# Patient Record
Sex: Male | Born: 1950
Health system: Southern US, Community
[De-identification: ages and names within clinical notes are randomized; demographics above are authoritative.]

## PROBLEM LIST (undated history)

## (undated) DIAGNOSIS — G709 Myoneural disorder, unspecified: Secondary | ICD-10-CM

## (undated) DIAGNOSIS — J329 Chronic sinusitis, unspecified: Secondary | ICD-10-CM

## (undated) DIAGNOSIS — I2510 Atherosclerotic heart disease of native coronary artery without angina pectoris: Secondary | ICD-10-CM

## (undated) DIAGNOSIS — E119 Type 2 diabetes mellitus without complications: Secondary | ICD-10-CM

## (undated) DIAGNOSIS — G510 Bell's palsy: Secondary | ICD-10-CM

## (undated) DIAGNOSIS — G8929 Other chronic pain: Secondary | ICD-10-CM

## (undated) DIAGNOSIS — E039 Hypothyroidism, unspecified: Secondary | ICD-10-CM

## (undated) DIAGNOSIS — R160 Hepatomegaly, not elsewhere classified: Secondary | ICD-10-CM

## (undated) DIAGNOSIS — K279 Peptic ulcer, site unspecified, unspecified as acute or chronic, without hemorrhage or perforation: Secondary | ICD-10-CM

## (undated) DIAGNOSIS — I10 Essential (primary) hypertension: Secondary | ICD-10-CM

## (undated) DIAGNOSIS — K219 Gastro-esophageal reflux disease without esophagitis: Secondary | ICD-10-CM

## (undated) DIAGNOSIS — M25569 Pain in unspecified knee: Secondary | ICD-10-CM

## (undated) DIAGNOSIS — S83289A Other tear of lateral meniscus, current injury, unspecified knee, initial encounter: Secondary | ICD-10-CM

## (undated) DIAGNOSIS — E669 Obesity, unspecified: Secondary | ICD-10-CM

## (undated) DIAGNOSIS — G4733 Obstructive sleep apnea (adult) (pediatric): Secondary | ICD-10-CM

## (undated) DIAGNOSIS — N289 Disorder of kidney and ureter, unspecified: Secondary | ICD-10-CM

## (undated) DIAGNOSIS — E785 Hyperlipidemia, unspecified: Secondary | ICD-10-CM

## (undated) DIAGNOSIS — J45909 Unspecified asthma, uncomplicated: Secondary | ICD-10-CM

## (undated) DIAGNOSIS — N186 End stage renal disease: Secondary | ICD-10-CM

## (undated) DIAGNOSIS — R0600 Dyspnea, unspecified: Secondary | ICD-10-CM

## (undated) DIAGNOSIS — N189 Chronic kidney disease, unspecified: Secondary | ICD-10-CM

## (undated) DIAGNOSIS — Z8719 Personal history of other diseases of the digestive system: Secondary | ICD-10-CM

## (undated) DIAGNOSIS — M549 Dorsalgia, unspecified: Secondary | ICD-10-CM

## (undated) DIAGNOSIS — D649 Anemia, unspecified: Secondary | ICD-10-CM

## (undated) DIAGNOSIS — M1990 Unspecified osteoarthritis, unspecified site: Secondary | ICD-10-CM

## (undated) DIAGNOSIS — E559 Vitamin D deficiency, unspecified: Secondary | ICD-10-CM

## (undated) DIAGNOSIS — J449 Chronic obstructive pulmonary disease, unspecified: Secondary | ICD-10-CM

## (undated) DIAGNOSIS — Z9981 Dependence on supplemental oxygen: Secondary | ICD-10-CM

## (undated) DIAGNOSIS — Z992 Dependence on renal dialysis: Secondary | ICD-10-CM

## (undated) DIAGNOSIS — N184 Chronic kidney disease, stage 4 (severe): Secondary | ICD-10-CM

## (undated) DIAGNOSIS — J189 Pneumonia, unspecified organism: Secondary | ICD-10-CM

## (undated) DIAGNOSIS — I499 Cardiac arrhythmia, unspecified: Secondary | ICD-10-CM

## (undated) DIAGNOSIS — M25519 Pain in unspecified shoulder: Secondary | ICD-10-CM

## (undated) DIAGNOSIS — M109 Gout, unspecified: Secondary | ICD-10-CM

## (undated) DIAGNOSIS — F1910 Other psychoactive substance abuse, uncomplicated: Secondary | ICD-10-CM

## (undated) HISTORY — DX: Essential (primary) hypertension: I10

## (undated) HISTORY — DX: Obesity, unspecified: E66.9

## (undated) HISTORY — DX: Peptic ulcer, site unspecified, unspecified as acute or chronic, without hemorrhage or perforation: K27.9

## (undated) HISTORY — DX: Gastro-esophageal reflux disease without esophagitis: K21.9

## (undated) HISTORY — PX: NM MYOVIEW LTD: HXRAD82

## (undated) HISTORY — DX: Hepatomegaly, not elsewhere classified: R16.0

## (undated) HISTORY — DX: Other tear of lateral meniscus, current injury, unspecified knee, initial encounter: S83.289A

## (undated) HISTORY — DX: Chronic sinusitis, unspecified: J32.9

## (undated) HISTORY — DX: Hyperlipidemia, unspecified: E78.5

## (undated) HISTORY — DX: Vitamin D deficiency, unspecified: E55.9

## (undated) HISTORY — DX: Chronic kidney disease, stage 4 (severe): N18.4

## (undated) HISTORY — PX: TOENAIL EXCISION: SUR558

## (undated) HISTORY — DX: Atherosclerotic heart disease of native coronary artery without angina pectoris: I25.10

## (undated) HISTORY — DX: Chronic obstructive pulmonary disease, unspecified: J44.9

## (undated) HISTORY — DX: Obstructive sleep apnea (adult) (pediatric): G47.33

## (undated) HISTORY — PX: DOPPLER ECHOCARDIOGRAPHY: SHX263

---

## 2003-10-28 ENCOUNTER — Emergency Department (HOSPITAL_COMMUNITY): Admission: EM | Admit: 2003-10-28 | Discharge: 2003-10-29 | Payer: Self-pay

## 2004-02-17 ENCOUNTER — Emergency Department (HOSPITAL_COMMUNITY): Admission: EM | Admit: 2004-02-17 | Discharge: 2004-02-17 | Payer: Self-pay | Admitting: Emergency Medicine

## 2005-02-05 ENCOUNTER — Emergency Department (HOSPITAL_COMMUNITY): Admission: EM | Admit: 2005-02-05 | Discharge: 2005-02-05 | Payer: Self-pay | Admitting: Emergency Medicine

## 2005-07-17 HISTORY — PX: UMBILICAL HERNIA REPAIR: SHX196

## 2005-07-27 ENCOUNTER — Inpatient Hospital Stay (HOSPITAL_COMMUNITY): Admission: EM | Admit: 2005-07-27 | Discharge: 2005-07-29 | Payer: Self-pay | Admitting: Emergency Medicine

## 2005-11-28 ENCOUNTER — Emergency Department (HOSPITAL_COMMUNITY): Admission: EM | Admit: 2005-11-28 | Discharge: 2005-11-28 | Payer: Self-pay | Admitting: Emergency Medicine

## 2007-01-11 ENCOUNTER — Emergency Department (HOSPITAL_COMMUNITY): Admission: EM | Admit: 2007-01-11 | Discharge: 2007-01-12 | Payer: Self-pay | Admitting: Emergency Medicine

## 2007-06-17 ENCOUNTER — Ambulatory Visit (HOSPITAL_COMMUNITY): Admission: RE | Admit: 2007-06-17 | Discharge: 2007-06-17 | Payer: Self-pay | Admitting: Internal Medicine

## 2007-08-25 ENCOUNTER — Emergency Department (HOSPITAL_COMMUNITY): Admission: EM | Admit: 2007-08-25 | Discharge: 2007-08-25 | Payer: Self-pay | Admitting: Emergency Medicine

## 2007-10-07 ENCOUNTER — Encounter: Admission: RE | Admit: 2007-10-07 | Discharge: 2007-10-07 | Payer: Self-pay | Admitting: Occupational Medicine

## 2007-10-14 ENCOUNTER — Emergency Department (HOSPITAL_COMMUNITY): Admission: EM | Admit: 2007-10-14 | Discharge: 2007-10-14 | Payer: Self-pay | Admitting: Emergency Medicine

## 2007-10-16 HISTORY — PX: KNEE ARTHROSCOPY: SUR90

## 2007-10-25 ENCOUNTER — Ambulatory Visit (HOSPITAL_COMMUNITY): Admission: RE | Admit: 2007-10-25 | Discharge: 2007-10-25 | Payer: Self-pay | Admitting: Orthopaedic Surgery

## 2007-11-07 ENCOUNTER — Ambulatory Visit (HOSPITAL_COMMUNITY): Admission: RE | Admit: 2007-11-07 | Discharge: 2007-11-07 | Payer: Self-pay | Admitting: Orthopaedic Surgery

## 2008-06-10 ENCOUNTER — Ambulatory Visit (HOSPITAL_COMMUNITY): Admission: RE | Admit: 2008-06-10 | Discharge: 2008-06-10 | Payer: Self-pay | Admitting: Internal Medicine

## 2008-06-10 ENCOUNTER — Encounter: Payer: Self-pay | Admitting: Orthopedic Surgery

## 2008-06-29 ENCOUNTER — Ambulatory Visit: Payer: Self-pay | Admitting: Orthopedic Surgery

## 2008-06-29 DIAGNOSIS — M7580 Other shoulder lesions, unspecified shoulder: Secondary | ICD-10-CM

## 2008-06-29 DIAGNOSIS — M25819 Other specified joint disorders, unspecified shoulder: Secondary | ICD-10-CM | POA: Insufficient documentation

## 2008-06-29 DIAGNOSIS — M25519 Pain in unspecified shoulder: Secondary | ICD-10-CM | POA: Insufficient documentation

## 2008-07-06 ENCOUNTER — Ambulatory Visit: Payer: Self-pay | Admitting: Orthopedic Surgery

## 2008-07-07 ENCOUNTER — Encounter: Payer: Self-pay | Admitting: Orthopedic Surgery

## 2008-08-06 ENCOUNTER — Ambulatory Visit: Payer: Self-pay | Admitting: Orthopedic Surgery

## 2008-08-06 DIAGNOSIS — M7530 Calcific tendinitis of unspecified shoulder: Secondary | ICD-10-CM | POA: Insufficient documentation

## 2008-08-20 ENCOUNTER — Ambulatory Visit: Payer: Self-pay | Admitting: Orthopedic Surgery

## 2008-09-29 ENCOUNTER — Ambulatory Visit: Payer: Self-pay | Admitting: Gastroenterology

## 2008-09-29 DIAGNOSIS — E782 Mixed hyperlipidemia: Secondary | ICD-10-CM | POA: Insufficient documentation

## 2008-09-29 DIAGNOSIS — E1122 Type 2 diabetes mellitus with diabetic chronic kidney disease: Secondary | ICD-10-CM | POA: Insufficient documentation

## 2008-09-29 DIAGNOSIS — E785 Hyperlipidemia, unspecified: Secondary | ICD-10-CM | POA: Insufficient documentation

## 2008-09-29 DIAGNOSIS — K921 Melena: Secondary | ICD-10-CM | POA: Insufficient documentation

## 2008-09-29 DIAGNOSIS — N185 Chronic kidney disease, stage 5: Secondary | ICD-10-CM

## 2008-09-29 DIAGNOSIS — K219 Gastro-esophageal reflux disease without esophagitis: Secondary | ICD-10-CM | POA: Insufficient documentation

## 2008-09-29 DIAGNOSIS — I10 Essential (primary) hypertension: Secondary | ICD-10-CM | POA: Insufficient documentation

## 2008-10-05 ENCOUNTER — Ambulatory Visit: Payer: Self-pay | Admitting: Orthopedic Surgery

## 2008-10-13 ENCOUNTER — Encounter: Payer: Self-pay | Admitting: Orthopedic Surgery

## 2008-10-14 ENCOUNTER — Encounter: Payer: Self-pay | Admitting: Orthopedic Surgery

## 2008-10-15 ENCOUNTER — Encounter: Payer: Self-pay | Admitting: Gastroenterology

## 2008-10-15 HISTORY — PX: COLONOSCOPY: SHX174

## 2008-10-21 ENCOUNTER — Ambulatory Visit (HOSPITAL_COMMUNITY): Admission: RE | Admit: 2008-10-21 | Discharge: 2008-10-21 | Payer: Self-pay | Admitting: Orthopedic Surgery

## 2008-10-21 ENCOUNTER — Ambulatory Visit: Payer: Self-pay | Admitting: Orthopedic Surgery

## 2008-10-21 DIAGNOSIS — M549 Dorsalgia, unspecified: Secondary | ICD-10-CM | POA: Insufficient documentation

## 2008-10-23 ENCOUNTER — Ambulatory Visit (HOSPITAL_COMMUNITY): Admission: RE | Admit: 2008-10-23 | Discharge: 2008-10-23 | Payer: Self-pay | Admitting: Gastroenterology

## 2008-10-23 ENCOUNTER — Ambulatory Visit: Payer: Self-pay | Admitting: Gastroenterology

## 2008-10-28 ENCOUNTER — Encounter (INDEPENDENT_AMBULATORY_CARE_PROVIDER_SITE_OTHER): Payer: Self-pay | Admitting: *Deleted

## 2008-10-29 ENCOUNTER — Ambulatory Visit (HOSPITAL_COMMUNITY): Admission: RE | Admit: 2008-10-29 | Discharge: 2008-10-29 | Payer: Self-pay | Admitting: Pulmonary Disease

## 2008-10-30 ENCOUNTER — Encounter: Payer: Self-pay | Admitting: Gastroenterology

## 2008-11-02 ENCOUNTER — Encounter: Payer: Self-pay | Admitting: Orthopedic Surgery

## 2008-11-06 ENCOUNTER — Encounter: Payer: Self-pay | Admitting: Orthopedic Surgery

## 2008-11-09 ENCOUNTER — Ambulatory Visit: Payer: Self-pay | Admitting: Orthopedic Surgery

## 2008-12-15 HISTORY — PX: OTHER SURGICAL HISTORY: SHX169

## 2008-12-28 ENCOUNTER — Ambulatory Visit: Payer: Self-pay | Admitting: Orthopedic Surgery

## 2008-12-28 DIAGNOSIS — M75120 Complete rotator cuff tear or rupture of unspecified shoulder, not specified as traumatic: Secondary | ICD-10-CM | POA: Insufficient documentation

## 2008-12-29 ENCOUNTER — Encounter: Payer: Self-pay | Admitting: Orthopedic Surgery

## 2009-01-07 ENCOUNTER — Telehealth: Payer: Self-pay | Admitting: Orthopedic Surgery

## 2009-01-08 ENCOUNTER — Ambulatory Visit (HOSPITAL_COMMUNITY): Admission: RE | Admit: 2009-01-08 | Discharge: 2009-01-08 | Payer: Self-pay | Admitting: Orthopedic Surgery

## 2009-01-08 ENCOUNTER — Ambulatory Visit: Payer: Self-pay | Admitting: Orthopedic Surgery

## 2009-01-11 ENCOUNTER — Ambulatory Visit: Payer: Self-pay | Admitting: Orthopedic Surgery

## 2009-01-13 ENCOUNTER — Encounter: Payer: Self-pay | Admitting: Orthopedic Surgery

## 2009-01-13 ENCOUNTER — Encounter (HOSPITAL_COMMUNITY): Admission: RE | Admit: 2009-01-13 | Discharge: 2009-02-12 | Payer: Self-pay | Admitting: Orthopedic Surgery

## 2009-01-25 ENCOUNTER — Ambulatory Visit: Payer: Self-pay | Admitting: Orthopedic Surgery

## 2009-02-09 ENCOUNTER — Encounter: Payer: Self-pay | Admitting: Orthopedic Surgery

## 2009-02-15 ENCOUNTER — Encounter (HOSPITAL_COMMUNITY): Admission: RE | Admit: 2009-02-15 | Discharge: 2009-03-17 | Payer: Self-pay | Admitting: Orthopedic Surgery

## 2009-02-22 ENCOUNTER — Ambulatory Visit: Payer: Self-pay | Admitting: Orthopedic Surgery

## 2009-02-22 ENCOUNTER — Telehealth: Payer: Self-pay | Admitting: Orthopedic Surgery

## 2009-03-08 ENCOUNTER — Encounter: Payer: Self-pay | Admitting: Orthopedic Surgery

## 2009-03-21 ENCOUNTER — Emergency Department (HOSPITAL_COMMUNITY): Admission: EM | Admit: 2009-03-21 | Discharge: 2009-03-21 | Payer: Self-pay | Admitting: Emergency Medicine

## 2009-03-29 ENCOUNTER — Telehealth: Payer: Self-pay | Admitting: Orthopedic Surgery

## 2009-03-31 ENCOUNTER — Telehealth: Payer: Self-pay | Admitting: Orthopedic Surgery

## 2009-04-05 ENCOUNTER — Ambulatory Visit: Payer: Self-pay | Admitting: Orthopedic Surgery

## 2009-04-05 DIAGNOSIS — M19019 Primary osteoarthritis, unspecified shoulder: Secondary | ICD-10-CM | POA: Insufficient documentation

## 2009-04-08 ENCOUNTER — Telehealth: Payer: Self-pay | Admitting: Orthopedic Surgery

## 2009-04-09 ENCOUNTER — Encounter: Payer: Self-pay | Admitting: Orthopedic Surgery

## 2009-04-15 ENCOUNTER — Ambulatory Visit: Payer: Self-pay | Admitting: Orthopedic Surgery

## 2009-05-12 ENCOUNTER — Ambulatory Visit: Payer: Self-pay | Admitting: Orthopedic Surgery

## 2009-05-24 ENCOUNTER — Encounter: Payer: Self-pay | Admitting: Orthopedic Surgery

## 2009-06-14 ENCOUNTER — Telehealth: Payer: Self-pay | Admitting: Orthopedic Surgery

## 2009-07-27 ENCOUNTER — Emergency Department (HOSPITAL_COMMUNITY): Admission: EM | Admit: 2009-07-27 | Discharge: 2009-07-27 | Payer: Self-pay | Admitting: Emergency Medicine

## 2009-08-19 ENCOUNTER — Ambulatory Visit: Payer: Self-pay | Admitting: Orthopedic Surgery

## 2009-08-19 DIAGNOSIS — M23302 Other meniscus derangements, unspecified lateral meniscus, unspecified knee: Secondary | ICD-10-CM | POA: Insufficient documentation

## 2009-08-24 ENCOUNTER — Telehealth: Payer: Self-pay | Admitting: Orthopedic Surgery

## 2009-08-26 ENCOUNTER — Ambulatory Visit (HOSPITAL_COMMUNITY): Admission: RE | Admit: 2009-08-26 | Discharge: 2009-08-26 | Payer: Self-pay | Admitting: Orthopedic Surgery

## 2009-09-09 ENCOUNTER — Ambulatory Visit: Payer: Self-pay | Admitting: Orthopedic Surgery

## 2009-09-09 DIAGNOSIS — M1710 Unilateral primary osteoarthritis, unspecified knee: Secondary | ICD-10-CM

## 2009-09-09 DIAGNOSIS — IMO0002 Reserved for concepts with insufficient information to code with codable children: Secondary | ICD-10-CM | POA: Insufficient documentation

## 2009-09-30 ENCOUNTER — Telehealth: Payer: Self-pay | Admitting: Orthopedic Surgery

## 2009-10-12 ENCOUNTER — Ambulatory Visit (HOSPITAL_COMMUNITY): Admission: RE | Admit: 2009-10-12 | Discharge: 2009-10-12 | Payer: Self-pay | Admitting: Urology

## 2009-10-25 ENCOUNTER — Telehealth: Payer: Self-pay | Admitting: Orthopedic Surgery

## 2009-10-28 ENCOUNTER — Encounter: Admission: RE | Admit: 2009-10-28 | Discharge: 2009-10-28 | Payer: Self-pay | Admitting: Internal Medicine

## 2009-11-11 ENCOUNTER — Ambulatory Visit: Payer: Self-pay | Admitting: Orthopedic Surgery

## 2009-12-09 ENCOUNTER — Ambulatory Visit: Payer: Self-pay | Admitting: Orthopedic Surgery

## 2010-01-18 ENCOUNTER — Ambulatory Visit (HOSPITAL_COMMUNITY): Admission: RE | Admit: 2010-01-18 | Discharge: 2010-01-18 | Payer: Self-pay | Admitting: Pulmonary Disease

## 2010-01-19 ENCOUNTER — Telehealth: Payer: Self-pay | Admitting: Orthopedic Surgery

## 2010-02-21 ENCOUNTER — Telehealth: Payer: Self-pay | Admitting: Orthopedic Surgery

## 2010-03-10 ENCOUNTER — Ambulatory Visit: Payer: Self-pay | Admitting: Orthopedic Surgery

## 2010-03-28 ENCOUNTER — Emergency Department (HOSPITAL_COMMUNITY): Admission: EM | Admit: 2010-03-28 | Discharge: 2010-03-28 | Payer: Self-pay | Admitting: Emergency Medicine

## 2010-03-31 ENCOUNTER — Inpatient Hospital Stay (HOSPITAL_COMMUNITY): Admission: EM | Admit: 2010-03-31 | Discharge: 2010-04-04 | Payer: Self-pay | Admitting: Emergency Medicine

## 2010-04-12 ENCOUNTER — Ambulatory Visit (HOSPITAL_COMMUNITY): Admission: RE | Admit: 2010-04-12 | Discharge: 2010-04-12 | Payer: Self-pay | Admitting: Internal Medicine

## 2010-04-18 ENCOUNTER — Telehealth: Payer: Self-pay | Admitting: Orthopedic Surgery

## 2010-06-14 ENCOUNTER — Ambulatory Visit: Payer: Self-pay | Admitting: Orthopedic Surgery

## 2010-06-24 ENCOUNTER — Telehealth: Payer: Self-pay | Admitting: Orthopedic Surgery

## 2010-08-10 ENCOUNTER — Emergency Department (HOSPITAL_COMMUNITY)
Admission: EM | Admit: 2010-08-10 | Discharge: 2010-08-10 | Payer: Self-pay | Source: Home / Self Care | Admitting: Emergency Medicine

## 2010-08-18 NOTE — Assessment & Plan Note (Signed)
Summary: recheck left shoulder post op.cbt   Visit Type:  Follow-up Primary Provider:  [name]  CC:  left shoulder pain.  History of Present Illness: I saw [name] [name] in the office today for a followup visit.  He is a 60 years old man with the complaint of:  left shoulder.   DX: left shoulder OA, partial RC tear.  Treatment :ASAD left shoulder, DOS 01/08/09, injection.  MEDS: Percocet 5.  subjective the Percocet is still helping his LEFT shoulder pain he has some stiffness in the morning  He is more concerned today about his LEFT knee had a LEFT knee arthroscopy by Dr. Luna Glasgow several years ago has an MRI from May or April 2000 I'm sure small tear of the posterior horn of medial meniscus  She also had has had some continued discomfort in that LEFT knee but 3 weeks ago he was stepping down out of a truck and he hyperextended it started having pain and swelling and now the knee feels like it wants to give away the Percocet helps.  Still some pain, some stiffness early am. Has a pulling sensation anterior shoulder.  T      Allergies: No Known Drug Allergies  Past History:  Past Medical History: Last updated: 09/29/2008 COPD Sinusitis Remote PUD, reports f/u EGD about 8 years ago unremarkable Diabetes GERD Hyperlipidemia Hypertension  Family History: Last updated: 09/29/2008 Family History of Diabetes Hx, family, asthma Father: DM, HTN, deceased age 52 Mother: MI, HTN, DM, deceased age 2, ?cancer before died (breast?) Siblings: 4 sisters, HTN, DM No FH of Colon Cancer:  Social History: Last updated: 09/29/2008 Patient is married.  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 Quit 3 different time, last time 11/09 Alcohol Use - yes, occasionally Illicit Drug Use - yes, smokes crack cocaine twice a month.  H/O previous  intranasal and IV drug use over 6 years ago.  Risk Factors: Caffeine Use: 3 (06/29/2008)  Risk Factors: Smoking Status: quit < 6 months (09/29/2008)  Past Surgical History: left knee arthroscopy hernia, umbilical  Review of Systems Neuro:  Denies numbness. MS:  Complains of joint swelling.  Physical Exam  Additional Exam:  general: Normal appearance  Oriented x3  Mood and affect normal  Gait and station abnormal with a limp LEFT leg favored  LEFT lower extremity large joint effusion  Range of motion 90  Knee stable  Strength normal  Skin normal  Medial joint line tenderness with positive Murray's LEFT knee  Pulse normal  Lymph nodes negative  Sensation normal  Balance normal coordination normal     Impression & Recommendations:  Problem # 1:  DERANGEMENT MENISCUS (ICD-717.5) Assessment New  Orders: Est. Patient Level IV [id number])  MRI LEFT knee fluid should provide some contrast to look for new meniscal tear  Patient Instructions: 1)  MRI left knee  2)  (come back for results) 3)  apply ice , take percocet Prescriptions: PERCOCET 5-325 MG TABS (OXYCODONE-ACETAMINOPHEN) 1 by mouth q 6 as needed pain  #90 x 0   Entered and Authorized by:   Arther Abbott MD   Signed by:   Arther Abbott MD on 08/19/2009   Method used:   Print then Give to Patient   RxID:   [id number]

## 2010-08-18 NOTE — Progress Notes (Signed)
Summary: want Percocet prescription   Phone Note Call from Patient   Summary of Call: [name] [name] ([birth year]) wants new Percocet prescription His # [phone] Initial call taken by: [name],  February 21, 2010 10:04 AM  Follow-up for Phone Call        printed signed and its ready Follow-up by: Arther Abbott MD,  February 21, 2010 10:06 AM  Additional Follow-up for Phone Call Additional follow up Details #1::        Adbvised the patient to pick up prescription Additional Follow-up by: [name],  February 21, 2010 10:48 AM    Prescriptions: PERCOCET 5-325 MG TABS (OXYCODONE-ACETAMINOPHEN) take 1/2 tablet every 4 hrs as needed pain  #90 x 0   Entered and Authorized by:   Arther Abbott MD   Signed by:   Arther Abbott MD on 02/21/2010   Method used:   Print then Give to Patient   RxID:   [id number]

## 2010-08-18 NOTE — Progress Notes (Signed)
Summary: wants Percocet prescription  Phone Note Call from Patient   Summary of Call: [name] [name] (2050-09-02) wants new Percocet prescription. His # [phone] or [phone] Initial call taken by: [name],  September 30, 2009 9:04 AM    Prescriptions: PERCOCET 5-325 MG TABS (OXYCODONE-ACETAMINOPHEN) 1 by mouth q 6 as needed pain  #90 x 0   Entered and Authorized by:   Arther Abbott MD   Signed by:   Arther Abbott MD on 09/30/2009   Method used:   Print then Give to Patient   RxID:   [id number]

## 2010-08-18 NOTE — Assessment & Plan Note (Signed)
Summary: INJECTION/ARTH, OR KNEE REPLACE/MEDICARE/BSF   Visit Type:  Follow-up Primary Provider:  [name]  CC:  left knee recheck.  History of Present Illness: I saw [name] [name] in the office today for a followup visit.  He is a 60 years old man with the complaint of:  2 month recheck left knee after injection, OA left knee  He says he has been taking Percocet 2.5 helps, needs refill  C/O SWELLING AND PAIN   Today is one month recheck, discuss options, pt is not ready for surgery.  Has pain level of 6 today with left knee, has some fluid.  Injections help knee, 09/09/09 was last injection.  Also brought pictures from Dr. Luna Glasgow of his LEFT knee arthroscopy in 2009 which showed he did have meniscal tear he had an adequate meniscal resection              Allergies: No Known Drug Allergies  Review of Systems Musculoskeletal:  shoulder is stable at this point.   Knee Exam  General:    Well-developed, well-nourished, normal body habitus; no deformities, normal grooming.  Gait:    Normal heel-toe gait pattern bilaterally.    Skin:    Intact, no scars, lesions, rashes, cafe au lait spots, or bruising.    Inspection:    small joint effusion  Palpation:    medial joint line tenderness  Vascular:    There was no swelling or varicose veins. The pulses and temperature are normal. There was no edema or tenderness.  Sensory:    Gross coordination and sensation were normal.    Motor:    Motor strength 5/5 bilaterally for quadriceps, hamstrings, ankle dorsiflexion, and ankle plantar flexion.    Knee Exam:    Left:    Inspection:  Abnormal    Palpation:  Abnormal    Range of Motion:       Flexion-Active: 125 degrees       Extension-Active: full  Anterior drawer:    Left negative Posterior drawer:    Left negative MCL:    Left negative LCL:    Left negative   Impression & Recommendations:  Problem # 1:  KNEE, ARTHRITIS, DEGEN./OSTEO  [id number]) Assessment Deteriorated  injection only LEFT knee  Verbal consent was obtained. The knee was prepped with alcohol and ethyl chloride. 1 cc of depomedrol 40mg /cc and 4 cc of lidocaine 1% was injected. there were no complications.  His updated medication list for this problem includes:     Percocet 5-325 Mg Tabs (Oxycodone-acetaminophen) .Marland Kitchen... Take 1/2 tablet every 4 hrs as needed pain  Orders: Est. Patient Level III [id number]) Depo- Medrol 40mg  (J1030) Joint Aspirate / Injection, Large (20610)  Patient Instructions: 1)  You have received an injection of cortisone today. You may experience increased pain at the injection site. Apply ice pack to the area for 20 minutes every 2 hours and take 2 xtra strength tylenol every 8 hours. This increased pain will usually resolve in 24 hours. The injection will take effect in 3-10 days.  2)  refill Percocet  3)  Please schedule a follow-up appointment in 3 months. Prescriptions: PERCOCET 5-325 MG TABS (OXYCODONE-ACETAMINOPHEN) take 1/2 tablet every 4 hrs as needed pain  #90 x 0   Entered and Authorized by:   Arther Abbott MD   Signed by:   Arther Abbott MD on 12/09/2009   Method used:   Print then Give to Patient   RxID:   [id number]

## 2010-08-18 NOTE — Progress Notes (Signed)
Summary: Patient requests Rx for Percocet  Phone Note Call from Patient   Caller: Patient Summary of Call: Patient called to ask for new prescription for Percocet.  Ph # is [phone] Initial call taken by: Ihor Austin,  October 25, 2009 1:20 PM  Follow-up for Phone Call        ok Follow-up by: Arther Abbott MD,  October 25, 2009 1:34 PM  Additional Follow-up for Phone Call Additional follow up Details #1::        advised patient to come in and get rx Additional Follow-up by: Peter Minium,  October 25, 2009 1:49 PM    New/Updated Medications: PERCOCET 2.5-325 MG TABS (OXYCODONE-ACETAMINOPHEN) 1 q  4 as needed pain Prescriptions: PERCOCET 2.5-325 MG TABS (OXYCODONE-ACETAMINOPHEN) 1 q  4 as needed pain  #90 x 0   Entered and Authorized by:   Arther Abbott MD   Signed by:   Arther Abbott MD on 10/25/2009   Method used:   Print then Give to Patient   RxID:   [id number]

## 2010-08-18 NOTE — Assessment & Plan Note (Signed)
Summary: 2 M RE-CK LT KNEE/LT SHOULDER//MEDICARE/CAF   Visit Type:  Follow-up Primary Provider:  [name]  CC:  recheck left knee.  History of Present Illness: I saw [name] [name] in the office today for a followup visit.  He is a 60 years old man with the complaint of:  2 month recheck left knee after injection, OA left knee  also status post LEFT shoulder surgery.   He says he has been taking Percocet 2.5 helps.  C/O SWELLING AND PAIN   He does have some swelling and effusion in that LEFT knee. His range of motion remains good. His knee is stable. His pain, tenderness or medial neurovascular exam is intact. Exam plating without a LEFT.  LEFT shoulder treatment at this time. He says a little pain when he lifts over his head, but not much.  I gave him options for surgical treatment or another injection. Once it think about it.  Continue his Percocet half tablet q.4 as needed for pain, and he'll see me in a month, and we'll discuss further treatment regarding his LEFT knee           Allergies: No Known Drug Allergies   Other Orders: Est. Patient Level II [id number])  Patient Instructions: 1)  OPTIONS: 2)  INJECTION, ARTHROSCOPY OR KNEE REPLACEMENT  3)  RETURN IN A MONTH  Prescriptions: PERCOCET 5-325 MG TABS (OXYCODONE-ACETAMINOPHEN) take 1/2 tablet every 4 hrs as needed pain  #90 x 0   Entered and Authorized by:   Arther Abbott MD   Signed by:   Arther Abbott MD on 11/11/2009   Method used:   Print then Give to Patient   RxID:   [id number]

## 2010-08-18 NOTE — Progress Notes (Signed)
Summary: wants percocet  prescription  Phone Note Call from Patient   Summary of Call: [name] [name] (May 03, 2051) wants a prescription for Percocet His # is [id number] Initial call taken by: [name],  January 19, 2010 5:06 PM  Follow-up for Phone Call        Left a message for the patient to call our office Follow-up by: [name],  January 20, 2010 8:53 AM  Additional Follow-up for Phone Call Additional follow up Details #1::        Pqtient  picked up the prescription Additional Follow-up by: [name],  January 20, 2010 11:40 AM    Prescriptions: PERCOCET 5-325 MG TABS (OXYCODONE-ACETAMINOPHEN) take 1/2 tablet every 4 hrs as needed pain  #90 x 0   Entered and Authorized by:   Arther Abbott MD   Signed by:   Arther Abbott MD on 01/19/2010   Method used:   Print then Give to Patient   RxID:   [id number]

## 2010-08-18 NOTE — Assessment & Plan Note (Signed)
Summary: MRI RESULTS BRINGING DISC/MEDICARE/BSF   Visit Type:  Follow-up Primary Provider:  [name]  CC:  left knee pain.  History of Present Illness: I saw [name] [name] in the office today for a followup visit.  He is a 60 years old man with the complaint of:  left knee  MRI results  IMPRESSION:   1.  Post meniscectomy changes medially with progressive medial compartment degenerative chondrosis, medial meniscal degeneration and probable reactive edema peripherally in the medial femoral condyle. 2.  No significant lateral or patellofemoral compartment findings. 3.  Enlarged joint effusion with mild synovial irregularity suggesting synovitis. 4.  No acute ligamentous findings.   Read By:  [name] [name],  M.D.      Imaging studies were reviewed with the report and I agree with the report he basically has arthritis in his knee  Recommend aspiration injection  Review of systems LEFT shoulder stable on Percocet  LEFT knee exam there is a mild joint effusion.  His flexion arc is 120.  There is medial joint line tenderness.  Muscle strength and muscle tone are normal.  His knee is stable.  He has varus alignment.  Aspirated 25 cc of fluid from LEFT knee under sterile technique it was clear fluid it was followed by injection of steroids into the knee joint we injected 40 mg of Depo-Medrol and lidocaine 5 cc 1%  Allergies: No Known Drug Allergies  Physical Exam  Additional Exam:  LEFT knee  [name] [name] looks fine today.  He is modestly overweight.  He is going to x3  His mood and affect are normal.  Has a slight limp to his LEFT knee  Has a mild effusion.  It inhibits his joint range of motion.  His knee is stable strength is normal he has medial joint line tenderness.  There are no skin changes pulses are intact     Impression & Recommendations:  Problem # 1:  KNEE, ARTHRITIS, DEGEN./OSTEO (ICD-715.96) Assessment Deteriorated  aspirate inject LEFT  knee  His updated medication list for this problem includes:    Norco 5-325 Mg Tabs (Hydrocodone-acetaminophen) .Marland Kitchen... 1 by mouth q 4 as needed    Norco 7.5-325 Mg Tabs (Hydrocodone-acetaminophen) .Marland Kitchen... 1 q 4 as needed pain    Lorcet 10/650 10-650 Mg Tabs (Hydrocodone-acetaminophen) .Marland Kitchen... 1 q  6 as needed pain    Percocet 5-325 Mg Tabs (Oxycodone-acetaminophen) .Marland Kitchen... 1 by mouth q 6 as needed pain    Lorcet 10/650 10-650 Mg Tabs (Hydrocodone-acetaminophen) ..... One by mouth q 4 hrs as needed pain  Orders: Est. Patient Level III [id number]) Joint Aspirate / Injection, Large (20610) Depo- Medrol 40mg  (J1030)  Patient Instructions: 1)  You have received an injection of cortisone today. You may experience increased pain at the injection site. Apply ice pack to the area for 20 minutes every 2 hours and take 2 xtra strength tylenol every 8 hours. This increased pain will usually resolve in 24 hours. The injection will take effect in 3-10 days.  2)  Please schedule a follow-up appointment in 2 months. Prescriptions: PERCOCET 5-325 MG TABS (OXYCODONE-ACETAMINOPHEN) 1 by mouth q 6 as needed pain  #90 x 0   Entered and Authorized by:   Arther Abbott MD   Signed by:   Arther Abbott MD on 09/09/2009   Method used:   Print then Give to Patient   RxID:   [id number]

## 2010-08-18 NOTE — Progress Notes (Signed)
Summary: Referral to Dr. Ace Gins for pain management.  Phone Note Outgoing Call   Call placed by: Santo Held,  June 24, 2010 8:37 AM Call placed to: Specialist Action Taken: Information Sent Summary of Call: I faxed a referral for this patient to Dr. Ace Gins to be seen for pain management.

## 2010-08-18 NOTE — Progress Notes (Signed)
Summary: needs new Percocet prescription  Phone Note Call from Patient   Summary of Call: [name] [name] (2051-02-27) left a message  that he needs  new Percocet prescription. His # [phone] Initial call taken by: [name],  April 18, 2010 10:34 AM    Prescriptions: PERCOCET 5-325 MG TABS (OXYCODONE-ACETAMINOPHEN) take 1/2 tablet every 4 hrs as needed pain  #90 x 0   Entered and Authorized by:   Arther Abbott MD   Signed by:   Arther Abbott MD on 04/18/2010   Method used:   Print then Give to Patient   RxID:   [id number]

## 2010-08-18 NOTE — Assessment & Plan Note (Signed)
Summary: 3 M RE-CK KNEE/RESP TO MED+RE-CK LT SHOULDERMEDICARE/CAF   Visit Type:  Follow-up Primary [name]:  [name]  CC:  recheck knee.  History of Present Illness: I saw [name] [name] in the office today for a followup visit.  He is a 60 years old man with the complaint of:  3 month recheck left knee OA and left shoulder  Problem #1 status post arthroscopy LEFT shoulder with followup MRI showing a complete tear the rotator cuff Problem #2 osteoarthritis LEFT knee status post LEFT knee arthroscopy with continued pain and recurrent effusions Problem #3 chronic pain requiring Percocet 5 mg for pain relief   His pain is intermittent in the shoulder and the knee but he does want to have surgery right now  I have discussed with him the use of Percocet as a pain reliever on a chronic basis and advised him that if he is not going to have surgery to try to get relief of his pain then he should go to pain clinic for monitoring and dispensing of this type of medicine    Allergies (verified): No Known Drug Allergies   Impression & Recommendations:  Problem # 1:  KNEE, ARTHRITIS, DEGEN./OSTEO (ICD-715.96)  His updated medication list for this problem includes:    Norco 5-325 Mg Tabs (Hydrocodone-acetaminophen) .Marland Kitchen... 1 by mouth q 4 as needed    Norco 7.5-325 Mg Tabs (Hydrocodone-acetaminophen) .Marland Kitchen... 1 q 4 as needed pain    Lorcet 10/650 10-650 Mg Tabs (Hydrocodone-acetaminophen) .Marland Kitchen... 1 q  6 as needed pain    Percocet 5-325 Mg Tabs (Oxycodone-acetaminophen) .Marland Kitchen... 1 by mouth q 4 as needed pain    Lorcet 10/650 10-650 Mg Tabs (Hydrocodone-acetaminophen) ..... One by mouth q 4 hrs as needed pain    Percocet 2.5-325 Mg Tabs (Oxycodone-acetaminophen) .Marland Kitchen... 1 q  4 as needed pain    Percocet 5-325 Mg Tabs (Oxycodone-acetaminophen) .Marland Kitchen... Take 1/2 tablet every 4 hrs as needed pain  Orders: Pain Clinic Referral (Pain) Est. Patient Level II [id number])  Problem # 2:  RUPTURE ROTATOR CUFF  (ICD-727.61)  Orders: Est. Patient Level II [id number])  Patient Instructions: 1)  referring to the pain clinic  2)  f/u in 3 months  Prescriptions: PERCOCET 5-325 MG TABS (OXYCODONE-ACETAMINOPHEN) 1 by mouth q 4 as needed pain  #90 x 0   Entered and Authorized by:   Arther Abbott MD   Signed by:   Arther Abbott MD on 06/14/2010   Method used:   Print then Give to Patient   RxID:   [id number]    Orders Added: 1)  Pain Clinic Referral [Pain] 2)  Est. Patient Level II [id number]

## 2010-08-18 NOTE — Assessment & Plan Note (Signed)
Summary: 3 M RE-CK KNEE/LT SHOULDERMEDICARE/CAF   Visit Type:  Follow-up Primary Abbrielle Batts:  [name]  CC:  recheck knee.  History of Present Illness: I saw LAWYER BUSTER in the office today for a followup visit.  He is a 60 years old man with the complaint of:  2 month recheck left knee after injection, OA left knee     Today is 3 month recheck left knee after injection 12/09/09.  The injection helped, has been working on house, left knee pain is ok, has left shoulder pain.  DX: left shoulder OA, partial RC tear.  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  Physical Exam  Additional Exam:  GEN: well developed, well nourished, normal grooming and hygiene, no deformity and normal body habitus.    Psyche: awake, alert and oriented. Mood normal   Gait: normal   The shoulder is tender over the anterolateral acromion, there is no swelling, the shoulder is stable, the SubScap is 5/5, the EXT/ROT are 5/5, the SSpinatus is 4/5. The impingement sign is positive. ROM: EXT/ROT=  50            INT/ROT=  N/T           FLEXION=  150         ABDUCTION=90         Impression & Recommendations:  Problem # 1:  KNEE, ARTHRITIS, DEGEN./OSTEO (ICD-715.96) Assessment Improved  His updated medication list for this problem includes:    Norco 5-325 Mg Tabs (Hydrocodone-acetaminophen) .Marland Kitchen... 1 by mouth q 4 as needed    Norco 7.5-325 Mg Tabs (Hydrocodone-acetaminophen) .Marland Kitchen... 1 q 4 as needed pain    Lorcet 10/650 10-650 Mg Tabs (Hydrocodone-acetaminophen) .Marland Kitchen... 1 q  6 as needed pain    Percocet 5-325 Mg Tabs  (Oxycodone-acetaminophen) .Marland Kitchen... 1 by mouth q 4 as needed pain    Lorcet 10/650 10-650 Mg Tabs (Hydrocodone-acetaminophen) ..... One by mouth q 4 hrs as needed pain    Percocet 2.5-325 Mg Tabs (Oxycodone-acetaminophen) .Marland Kitchen... 1 q  4 as needed pain    Percocet 5-325 Mg Tabs (Oxycodone-acetaminophen) .Marland Kitchen... Take 1/2 tablet every 4 hrs as needed pain  Orders: Est. Patient Level III [id number])  Problem # 2:  IMPINGEMENT SYNDROME (ICD-726.2) Assessment: Deteriorated  REC: ORAL PERCOCET, ACTIVITY MODIFICATION   Orders: Est. Patient Level III [id number])  Medications Added to Medication List This Visit: 1)  Percocet 5-325 Mg Tabs (Oxycodone-acetaminophen) .Marland Kitchen.. 1 by mouth q 4 as needed pain  Patient Instructions: 1)  Please schedule a follow-up appointment in 3 months. Prescriptions: PERCOCET 5-325 MG TABS (OXYCODONE-ACETAMINOPHEN) 1 by mouth q 4 as needed pain  #90 x 0   Entered and Authorized by:   Arther Abbott MD   Signed by:   Arther Abbott MD on 03/10/2010   Method used:   Print then Give to Patient   RxID:   [id number]

## 2010-08-18 NOTE — Progress Notes (Signed)
Summary: MRI appointment.  Phone Note Outgoing Call   Call placed by: Santo Held,  August 24, 2009 2:37 PM Call placed to: Patient Action Taken: Phone Call Completed, Appt scheduled Summary of Call: I called to give the patient his MRI appointment at [facility] on 08-26-09 at 10:30. Patient has Medicare, no precert is needed. Patient will follow up back here at our office.

## 2010-08-24 ENCOUNTER — Telehealth: Payer: Self-pay | Admitting: Orthopedic Surgery

## 2010-08-24 ENCOUNTER — Encounter: Payer: Self-pay | Admitting: Orthopedic Surgery

## 2010-09-01 NOTE — Letter (Signed)
Summary: Generic Letter re: pain medication  [facility]  [address]. [place] [address]  [place], [place] [address]   Phone: [phone]  Fax: [phone]       08/24/2010  [name] [name] [place], [place]  [address]   Dear [name] [name],  This letter is to address a matter that has come to our attention following the referral to pain management specialist.  Due to your decision to decline pain management, effective immediately, we can no longer offer or prescribe pain medication.         Sincerely,    Demetrius Revel, MD

## 2010-09-01 NOTE — Progress Notes (Signed)
Summary: Patient declined appointment with Dr. Ace Gins.  Phone Note From Other Clinic   Caller: Referral Coordinator Summary of Call: Latoya from Dr. Unice Bailey office sent a fax stating that the patient declined an appointment because he wanted to continue a pain medication management that conflicted with their non opioid protocol. Initial call taken by: Santo Held,  August 24, 2010 9:22 AM  Follow-up for Phone Call        please send a letter that we can no longer offer him pain medication if he refuses pain management  Follow-up by: Arther Abbott MD,  August 24, 2010 9:24 AM  Additional Follow-up for Phone Call Additional follow up Details #1::        letter in chart for review and sign. Additional Follow-up by: Ihor Austin,  August 24, 2010 3:58 PM

## 2010-09-14 ENCOUNTER — Ambulatory Visit (INDEPENDENT_AMBULATORY_CARE_PROVIDER_SITE_OTHER): Payer: Medicare PPO | Admitting: Orthopedic Surgery

## 2010-09-14 ENCOUNTER — Encounter: Payer: Self-pay | Admitting: Orthopedic Surgery

## 2010-09-14 DIAGNOSIS — IMO0002 Reserved for concepts with insufficient information to code with codable children: Secondary | ICD-10-CM

## 2010-09-14 DIAGNOSIS — M1710 Unilateral primary osteoarthritis, unspecified knee: Secondary | ICD-10-CM

## 2010-09-22 NOTE — Assessment & Plan Note (Signed)
Summary: 3 M RE-CK LT KNEE+SHOULDER/MEDICARE/CAF   Visit Type:  Follow-up Primary Provider:  [name]  CC:  recheck knee and shoulder.  History of Present Illness: I saw [name] [name] in the office today for a followup visit.  He is a 60 years old man with the complaint of:  3 month recheck left knee OA and left shoulder  Problem #1 status post arthroscopy LEFT shoulder with followup MRI showing a complete tear the rotator cuff Problem #2 osteoarthritis LEFT knee status post LEFT knee arthroscopy with continued pain and recurrent effusions Problem #3 chronic pain requiring Percocet 5 mg for pain relief  I have discussed with him the use of Percocet as a pain reliever on a chronic basis and advised him that if he is not going to have surgery to try to get relief of his pain then he should go to pain clinic for monitoring and dispensing of this type of medicine  Patient refused appt with Dr. Ace Gins because he wanted to continue pain medication, not their protocol.  Pain level is around 5 due to weather.  Out of Percocet.  His LEFT knee still bothers a max of the time seems to have pain around his patella. On exam, tenderness of medial femoral condyle and medial joint line      Allergies: No Known Drug Allergies  Review of Systems Musculoskeletal:  Complains of joint pain and stiffness.  Physical Exam  Additional Exam:  GENERAL: Appearance is normal   CDV: normal pulse and temperature   exam LEFT knee. He is tender over the medial lateral facets of the patella inferior pole patella. Medial joint line.  He still has a functional range of motion. His knee shows no laxity. He has normal muscle tone and has an unsupported ambulation pattern.     Impression & Recommendations:  Problem # 1:  KNEE, ARTHRITIS, DEGEN./OSTEO (ICD-715.96) Assessment Deteriorated  His updated medication list for this problem includes:    Norco 5-325 Mg Tabs (Hydrocodone-acetaminophen)  .Marland Kitchen... 1 by mouth q 4 as needed    Norco 7.5-325 Mg Tabs (Hydrocodone-acetaminophen) .Marland Kitchen... 1 q 4 as needed pain    Lorcet 10/650 10-650 Mg Tabs (Hydrocodone-acetaminophen) .Marland Kitchen... 1 q  6 as needed pain    Percocet 5-325 Mg Tabs (Oxycodone-acetaminophen) .Marland Kitchen... 1 by mouth q 4 as needed pain    Lorcet 10/650 10-650 Mg Tabs (Hydrocodone-acetaminophen) ..... One by mouth q 4 hrs as needed pain    Percocet 2.5-325 Mg Tabs (Oxycodone-acetaminophen) .Marland Kitchen... 1 q  4 as needed pain    Percocet 5-325 Mg Tabs (Oxycodone-acetaminophen) .Marland Kitchen... Take 1/2 tablet every 4 hrs as needed pain    Norco 5-325 Mg Tabs (Hydrocodone-acetaminophen) .Marland Kitchen... 1 by mouth q 4 prn pain  Orders: Est. Patient Level III [id number]) Joint Aspirate / Injection, Large (20610) Depo- Medrol 40mg  (J1030)  Medications Added to Medication List This Visit: 1)  Norco 5-325 Mg Tabs (Hydrocodone-acetaminophen) .Marland Kitchen.. 1 by mouth q 4 prn pain  Patient Instructions: 1)  You have received an injection of cortisone today. You may experience increased pain at the injection site. Apply ice pack to the area for 20 minutes every 2 hours and take 2 xtra strength tylenol every 8 hours. This increased pain will usually resolve in 24 hours. The injection will take effect in 3-10 days.  2)  Please schedule a follow-up appointment in 6 months. 3)  left knee xrays  Prescriptions: NORCO 5-325 MG TABS (HYDROCODONE-ACETAMINOPHEN) 1 by mouth q 4 prn pain  #  40 x 5   Entered and Authorized by:   Arther Abbott MD   Signed by:   Arther Abbott MD on 09/14/2010   Method used:   Print then Give to Patient   RxID:   [id number]    Orders Added: 1)  Est. Patient Level III [id number] 2)  Joint Aspirate / Injection, Large [id number] 3)  Depo- Medrol 40mg  [id number]

## 2010-09-29 ENCOUNTER — Ambulatory Visit: Payer: Self-pay

## 2010-09-29 LAB — DIFFERENTIAL
Eosinophils Absolute: 0.1 10*3/uL (ref 0.0–0.7)
Lymphocytes Relative: 23 % (ref 12–46)
Lymphs Abs: 1.4 10*3/uL (ref 0.7–4.0)
Lymphs Abs: 1.6 10*3/uL (ref 0.7–4.0)
Monocytes Relative: 9 % (ref 3–12)
Neutro Abs: 1.8 10*3/uL (ref 1.7–7.7)
Neutrophils Relative %: 49 % (ref 43–77)
Neutrophils Relative %: 66 % (ref 43–77)

## 2010-09-29 LAB — CBC
HCT: 40.3 % (ref 39.0–52.0)
Hemoglobin: 13.2 g/dL (ref 13.0–17.0)
MCH: 28 pg (ref 26.0–34.0)
Platelets: 148 10*3/uL — ABNORMAL LOW (ref 150–400)
RBC: 4.73 MIL/uL (ref 4.22–5.81)
RBC: 4.94 MIL/uL (ref 4.22–5.81)
WBC: 6.8 10*3/uL (ref 4.0–10.5)

## 2010-09-29 LAB — GLUCOSE, CAPILLARY
Glucose-Capillary: 108 mg/dL — ABNORMAL HIGH (ref 70–99)
Glucose-Capillary: 109 mg/dL — ABNORMAL HIGH (ref 70–99)
Glucose-Capillary: 118 mg/dL — ABNORMAL HIGH (ref 70–99)
Glucose-Capillary: 119 mg/dL — ABNORMAL HIGH (ref 70–99)
Glucose-Capillary: 126 mg/dL — ABNORMAL HIGH (ref 70–99)
Glucose-Capillary: 152 mg/dL — ABNORMAL HIGH (ref 70–99)
Glucose-Capillary: 172 mg/dL — ABNORMAL HIGH (ref 70–99)
Glucose-Capillary: 91 mg/dL (ref 70–99)

## 2010-09-29 LAB — BASIC METABOLIC PANEL
CO2: 30 mEq/L (ref 19–32)
Calcium: 9.3 mg/dL (ref 8.4–10.5)
Calcium: 9.1 mg/dL (ref 8.4–10.5)
Chloride: 101 mEq/L (ref 96–112)
Creatinine, Ser: 1.36 mg/dL (ref 0.4–1.5)
GFR calc Af Amer: >60 mL/min (ref >60)
GFR calc Af Amer: >60 mL/min (ref >60)
Potassium: 4 mEq/L (ref 3.5–5.1)
Sodium: 139 mEq/L (ref 135–145)

## 2010-09-29 LAB — CULTURE, BLOOD (ROUTINE X 2)
Culture: NO GROWTH
Culture: NO GROWTH

## 2010-10-21 LAB — GLUCOSE, CAPILLARY: Glucose-Capillary: 124 mg/dL — ABNORMAL HIGH (ref 70–99)

## 2010-10-24 LAB — CBC
HCT: 39.9 % (ref 39.0–52.0)
MCV: 84.1 fL (ref 78.0–100.0)
RBC: 4.74 MIL/uL (ref 4.22–5.81)
WBC: 4.7 10*3/uL (ref 4.0–10.5)

## 2010-10-24 LAB — RAPID URINE DRUG SCREEN, HOSP PERFORMED
Amphetamines: NOT DETECTED
Barbiturates: NOT DETECTED
Benzodiazepines: NOT DETECTED
Cocaine: NOT DETECTED

## 2010-10-24 LAB — BASIC METABOLIC PANEL
Chloride: 104 mEq/L (ref 96–112)
Creatinine, Ser: 1.42 mg/dL (ref 0.4–1.5)
GFR calc Af Amer: >60 mL/min (ref >60)
Potassium: 4 mEq/L (ref 3.5–5.1)

## 2010-10-24 LAB — GLUCOSE, CAPILLARY: Glucose-Capillary: 188 mg/dL — ABNORMAL HIGH (ref 70–99)

## 2010-10-26 LAB — BLOOD GAS, ARTERIAL
Bicarbonate: 25.9 mEq/L — ABNORMAL HIGH (ref 20.0–24.0)
TCO2: 22.8 mmol/L (ref 0–100)
pCO2 arterial: 43.3 mmHg (ref 35.0–45.0)
pH, Arterial: 7.393 (ref 7.350–7.450)

## 2010-11-27 ENCOUNTER — Emergency Department (HOSPITAL_COMMUNITY)
Admission: EM | Admit: 2010-11-27 | Discharge: 2010-11-28 | Disposition: A | Discharge status: Home / Self Care | Payer: Medicare PPO | Attending: Emergency Medicine | Admitting: Emergency Medicine

## 2010-11-27 ENCOUNTER — Emergency Department (HOSPITAL_COMMUNITY): Payer: Medicare PPO

## 2010-11-27 DIAGNOSIS — I10 Essential (primary) hypertension: Secondary | ICD-10-CM | POA: Insufficient documentation

## 2010-11-27 DIAGNOSIS — M62838 Other muscle spasm: Secondary | ICD-10-CM | POA: Insufficient documentation

## 2010-11-27 DIAGNOSIS — M436 Torticollis: Secondary | ICD-10-CM | POA: Insufficient documentation

## 2010-11-27 DIAGNOSIS — J449 Chronic obstructive pulmonary disease, unspecified: Secondary | ICD-10-CM | POA: Insufficient documentation

## 2010-11-27 DIAGNOSIS — Z79899 Other long term (current) drug therapy: Secondary | ICD-10-CM | POA: Insufficient documentation

## 2010-11-27 DIAGNOSIS — Z794 Long term (current) use of insulin: Secondary | ICD-10-CM | POA: Insufficient documentation

## 2010-11-27 DIAGNOSIS — M542 Cervicalgia: Secondary | ICD-10-CM | POA: Insufficient documentation

## 2010-11-27 DIAGNOSIS — E119 Type 2 diabetes mellitus without complications: Secondary | ICD-10-CM | POA: Insufficient documentation

## 2010-11-27 DIAGNOSIS — J4489 Other specified chronic obstructive pulmonary disease: Secondary | ICD-10-CM | POA: Insufficient documentation

## 2010-11-27 DIAGNOSIS — E785 Hyperlipidemia, unspecified: Secondary | ICD-10-CM | POA: Insufficient documentation

## 2010-11-29 NOTE — Op Note (Signed)
NAME:  [name] [name], [name] [name] NO.:  [id number]   MEDICAL RECORD NO.:  [id number]          PATIENT TYPE:  AMB   LOCATION:  DAY                           FACILITY:  [facility]   PHYSICIAN:  Carole Civil, M.D.DATE OF BIRTH:  17-Aug-1950   DATE OF PROCEDURE:  01/08/2009  DATE OF DISCHARGE:                               OPERATIVE REPORT   HISTORY:  Rohun Hensch is 60 year old who was followed for pain in his  left shoulder, it was treated with physical therapy, injection, oral  pain medications, did not improve.  He had an MRI at [facility] which  showed a partial rotator cuff tear and AC joint arthrosis and some  calcifications in the supraspinatus tendon or subacromial space.  Because of his significant pain and failure to improve.  He was offered  surgical treatment versus continued nonoperative treatment and he opted  for surgical treatment.   PREOPERATIVE DIAGNOSIS:  Rotator cuff tear, acromioclavicular joint  arthritis, left shoulder.   POSTOPERATIVE DIAGNOSIS:  Partial tear of rotator cuff, left shoulder,  biceps tendonitis.   PROCEDURE:  Arthroscopy, left shoulder, bursectomy, debridement of  rotator cuff and biceps tendon.   SURGEON:  Carole Civil, MD   ASSISTANTS:  None.   ANESTHETIC:  General.   FINDINGS:  Less than 50% supraspinatus tear, articular side.  There was  biceps tendonitis.  There was fraying of the superior edge of the  subscapularis.  There was bursitis in the subacromial space.  There were  no specimens.   BLOOD LOSS:  Zero.   COMPLICATIONS:  None and the patient returned to the PACU in good  condition.   DESCRIPTION OF PROCEDURE:  After site marking, identification in the  preop area, the patient was taken to the surgical suite for general  anesthesia which was successful and uncomplicated.  He was placed in a  modified beach-chair position in a slide positioner.  His left arm was  prepped along with left shoulder with  chlorhexidine and then draped  sterilely.  At this point, we took a time-out and confirmed the  procedure.  He was given his preop antibiotic 1 g of Ancef and we  proceeded as noted below.   A posterior portal was established.  A scope was introduced into the  joint and diagnostic arthroscopy was performed and anterior portal was  established in the triangle area.  A cannula was placed and a  debridement of the biceps tendon was performed.  A probe was then placed  in the joint and the tendon was pulled into the joint and found to be  intact.  The labral attachment area was also intact.   There was some tearing and fraying of the free edge or superior edge of  the subscapularis.  The undersurface of the rotator cuff was torn at the  supraspinatus.  The remaining glenohumeral joint surfaces looked normal.   The scope was then placed into the subacromial space and a debridement  of the bursa was performed until the rotator cuff could be identified  along the [facility] joint.  The [facility]  joint was found to be relatively good with  the exception of acromial side spur which was removed through a lateral  portal with a ball tip bur.  The scope was then placed back into the  joint because the tear was not seen on the bursal side.  We placed a  spinal needle through the tear visualizing from the joint side and then  went back into the subacromial space with the articular surface tear  marked and found no evidence of full-thickness tearing.  The scope was  placed back into the joint and the rotator cuff tear was debrided, it  amounted to less than 50% of the thickness of the tendon and we  therefore completed our bursectomy and then closed with 3-0 Prolene  sutures.  We injected 30 mL of Marcaine in subacromial space and  extubated the patient and placed him a cryo cuff.   POSTOP PLAN:  Rotator cuff tear repair type protocol.   He will be discharged with the following medications:  1. Percocet 5/325  mg.  2. Tylenol 1 every 4 hours p.r.n. for pain, #84.  3. Robaxin 500 mg every 8 hours, #30, one refill.  4. Phenergan 25 mg q.4 p.r.n. nausea, #30, no refills.   His postop appointment scheduled for June 28, at 12:15 p.m.      Carole Civil, M.D.  Electronically Signed     SEH/MEDQ  D:  01/08/2009  T:  01/09/2009  Job:  [id number]

## 2010-11-29 NOTE — Op Note (Signed)
NAME:  [name] [name], [name] [name] NO.:  [id number]   MEDICAL RECORD NO.:  [id number]          PATIENT TYPE:  AMB   LOCATION:  DAY                           FACILITY:  [facility]   PHYSICIAN:  J. Sanjuana Kava, M.D. DATE OF BIRTH:  07-14-1951   DATE OF PROCEDURE:  DATE OF DISCHARGE:                               OPERATIVE REPORT   PREOPERATIVE DIAGNOSIS:  Tear of the medial meniscus, left knee.   POSTOPERATIVE DIAGNOSIS:  Tear of the medial meniscus, left knee.   PROCEDURE:  Operative arthroscopy and partial medial meniscectomy of the  left knee using the holmium laser.   ANESTHESIA:  General.   TOURNIQUET TIME:  28 minutes.   DRAINS:  No drains.   INDICATIONS:  The patient is a 60 year old male with pain and tenderness  in his left knee.  MRI shows tear of the medial meniscus, giving away of  his knee locking.  Conservative treatment has been unsuccessful.  I  discussed with him operative arthroscopy of the knee.  Risks,  imponderables  were discussed.  He asked appropriate questions.  He  agrees to the outpatient procedure.  He understands this is an elective  procedure.   DESCRIPTION OF PROCEDURE:  The patient was seen in the holding area.  The left knee was identified as correct surgical site.  I placed a mark  on the left knee.  He was brought to the operating room, placed supine  on the operating room table.  He was given general anesthesia.  Tourniquet and leg holder placed deflated on the left upper thigh.  He  was prepped and draped in the usual manner.  A timeout identified Mr.  [name] as the patient and the left knee was correct surgical site.  The  leg was elevated and wrapped circumferentially with an Esmarch bandage.  Tourniquet was inflated to 300 mmHg.  Esmarch bandage was removed.  Inflow cannula was inserted medially.  Lactated Ringer's was instilled  into the knee by an infusion pump.  Arthroscope was inserted laterally,  the knee was systematically  examined.   Suprapatellar pouch looked normal.  Undersurface of patella had grade 2  changes medially, the proximal tibia and distal femur had grade 2  changes with a tear in the posterior horn of the medial meniscus.  Anterior cruciate was intact.  Laterally, the meniscus looked normal and  there was grade 2 changes.  There were no loose bodies present.   Attention was directed to the medial side.  Using meniscal punch and  shaver, a good smooth contour was obtained, but there were still  a  portion left; elected to use the holmium laser.  There was a smaller  tip.  I was able to get into the smaller space using a holmium laser and  got a good smooth contour on the posterior horn of the medial meniscus  on the left knee.  The assisted laser was removed.  The knee was  systematically reexamined and no pathology found.  Pertinent pictures  were taken.  Knee was irrigated with remaining part of lactated  Ringer's.  The wound was reapproximated using 3-0 nylon in an  interrupted vertical mattress manner.  Total tourniquet time was 28  minutes.  Marcaine 0.25% was instilled in each portal.  Sterile dressing  was applied.  Bulky dressing was applied.  The patient  will be  seen in recovery where a knee immobilizer will be applied.  Prescription for Vicodin ES was given for pain.  I will see him in the  office in approximately 10 days to 2 weeks.  Physical therapy has been  arranged.  If any difficulties, he is to contact me through the office  or hospital beeper system.           ______________________________  Lenna Sciara. Sanjuana Kava, M.D.     JWK/MEDQ  D:  11/07/2007  T:  11/08/2007  Job:  [id number]

## 2010-11-29 NOTE — Procedures (Signed)
NAME:  TUDOR, LEGNER NO.:  [id number]   MEDICAL RECORD NO.:  [id number]          PATIENT TYPE:  OUT   LOCATION:  RESP                          FACILITY:  [facility]   PHYSICIAN:  Edward L. Luan Pulling, M.D.DATE OF BIRTH:  17-Mar-1951   DATE OF PROCEDURE:  DATE OF DISCHARGE:                            PULMONARY FUNCTION TEST   1. Spirometry shows no definite ventilatory defect, but does show      evidence of airflow obstruction most marked in the smaller airways.      2.  Lung volumes are normal.  2. DLCO is normal.  3. Arterial blood gases show mild resting hypoxemia.  Otherwise      normal.      Edward L. Luan Pulling, M.D.  Electronically Signed     ELH/MEDQ  D:  10/30/2008  T:  10/30/2008  Job:  [id number]

## 2010-11-29 NOTE — Op Note (Signed)
NAME:  [name] [name], ENT NO.:  [id number]   MEDICAL RECORD NO.:  [id number]          PATIENT TYPE:  AMB   LOCATION:  DAY                           FACILITY:  [facility]   PHYSICIAN:  Caro Hight, M.D.      DATE OF BIRTH:  March 14, 1951   DATE OF PROCEDURE:  10/23/2008  DATE OF DISCHARGE:                               OPERATIVE REPORT   REFERRING PHYSICIAN:  Tesfaye D. Legrand Rams, MD   PROCEDURE:  Colonoscopy with snare cautery polypectomy.   INDICATION FOR EXAM:  [name] [name] is a 60 year old male presents with  rectal bleeding.   FINDINGS:  1. Tortuous colon requiring multiple change in position and pressure      to successfully intubate the cecum.  Otherwise single ascending      colon diverticulum near the IC valve.  No masses, inflammatory      changes, or AVMs seen.  2. Two polyps seen just above the dentate line.  One disappeared with      insufflation.  The other was removed via snare cautery.  The polyp      could not be retrieved.  A small internal hemorrhoids.   DIAGNOSES:  1. Rectal bleeding likely secondary to hemorrhoids.  2. Rectal polyp obliterated, but not retrieved.   RECOMMENDATIONS:  1. Screening colonoscopy in 10 years.  2. Anusol-HC Suppository 1 per rectum every 12 hours for 10 days.  3. No aspirin or NSAIDs for 7 days.  No anticoagulation for 7 days.  4. He should follow a high-fiber diet.  He was given a handout on high-      fiber diet and hemorrhoids.   MEDICATIONS:  1. Demerol 75 mg IV.  2. Versed 4 mg IV.   PROCEDURE TECHNIQUE:  Physical exam was performed.  Informed consent was  obtained from the patient after explaining the benefits, risks, and  alternatives to the procedure.  The patient was connected to the monitor  and placed in the left lateral position.  Continuous oxygen was provided  by nasal cannula and IV medicine was administered through an indwelling  cannula.  After administration of sedation and rectal exam, the  patient's  rectum  was intubated and the scope was advanced under direct visualization to  the cecum.  The scope was removed slowly by carefully examining the  color, texture, anatomy, and integrity of the mucosa on the way out.  The patient was recovered in endoscopy and discharged home in  satisfactory condition.      Caro Hight, M.D.  Electronically Signed     SM/MEDQ  D:  10/23/2008  T:  10/23/2008  Job:  [id number]   cc:   Brandon Melnick D. Legrand Rams, MD  Fax: [phone]

## 2010-11-29 NOTE — H&P (Signed)
NAME:  [name] [name], [name] [name] NO.:  [id number]   MEDICAL RECORD NO.:  [id number]          PATIENT TYPE:  AMB   LOCATION:  DAY                           FACILITY:  [facility]   PHYSICIAN:  J. Sanjuana Kava, M.D. DATE OF BIRTH:  1951-02-14   DATE OF ADMISSION:  11/07/2007  DATE OF DISCHARGE:  LH                              HISTORY & PHYSICAL   CHIEF COMPLAINT:  The left knee pain.   The patient is a 60 year old male who I first saw in the office on April  7.  He had an injury after climbing a ladder on the first week of March.  There was some popping and giving way of the knee.  He saw Dr. Legrand Rams  after going to the ER on March 30.  X-rays in the ER done on March 30  showed mild DJD and were negative.  He is tired of his knee giving way,  tired of having instability in the knee, tired of having swelling.  I  was concerned he had a medial meniscal tear and asked for an MRI.  MRI  was done of the knee on April 10 showing a small radial tear of the  posterior horn of the medial meniscus with effusion.  I informed him of  the findings on April 15.  I explained to him about the possibility of  arthroscopy of the knee.  Risks and imponderables of the procedure were  discussed.  He understands this is an elective outpatient procedure.  He  elects that he wants to have surgery.  He decided he would like to go  ahead and have the procedure.  Physical therapy has been scheduled  postoperatively.   PAST HISTORY:  1. Hypertension.  2. Diabetes.  3. Otherwise negative.   ALLERGIES:  NO ALLERGIES.   MEDICATIONS:  1. Insulin nightly.  2. Lotrel 10/40 daily.  3. Glyburide/metformin 5/500 b.i.d.  4. Actos 45 daily.  5. Prevacid 30 daily.  6. Simvastatin 20 mg daily.  7. Proventil as needed.  8. Aspirin 81 mg daily.  9. Chantix 1 mg daily.   SOCIAL HISTORY:  The patient smokes and uses alcoholic beverages  socially.  Dr. Legrand Rams is the family doctor.  Status post hernia surgery  October 2008.  He lists no diseases that run in the family.   He lives in [place] and is married.   PHYSICAL EXAMINATION:  VITAL SIGNS:  BP is 160/92, pulse 60,  respirations 16, afebrile, 5 feet 5 inches, 227 pounds.  GENERAL:  He is alert, cooperative, oriented.  HEENT:  Negative.  NECK:  Supple.  LUNGS: Clear to P&A.  HEART:  Regular rhythm without murmur heard.  ABDOMEN:  Soft and obese without masses.  EXTREMITIES:  Left knee is tender, positive pain on the medial joint  line and positive medial McMurray.  He has an effusion crepitus.  Other  extremities negative.  CNS: Intact.  SKIN:  Intact   IMPRESSION:  Tear of the medial meniscus of the left knee.   PLAN:  Operative arthroscopy of the left knee.  Labs are  pending.  Again, physical therapy has been arranged postoperatively.                                            ______________________________  Lenna Sciara. Sanjuana Kava, M.D.     JWK/MEDQ  D:  11/04/2007  T:  11/04/2007  Job:  [id number]

## 2010-12-02 NOTE — H&P (Signed)
NAME:  [name] [name], [name] [name] NO.:  [id number]   MEDICAL RECORD NO.:  [id number]          PATIENT TYPE:  INP   LOCATION:  A339                          FACILITY:  [facility]   PHYSICIAN:  Tesfaye D. Legrand Rams, MD   DATE OF BIRTH:  May 17, 1951   DATE OF ADMISSION:  07/27/2005  DATE OF DISCHARGE:  LH                                HISTORY & PHYSICAL   CHIEF COMPLAINT:  Cough, congestion, shortness of breath, and high blood  sugar.   HISTORY OF PRESENT ILLNESS:  This is a 60 year old male patient with a known  case of diabetes mellitus and hypertension.  He came to the office with the  complaint of cough, congestions, and shortness of breath.  The patient was  evaluated in the office.  During the evaluation the patient was found to  have a blood sugar reading above 500.  The patient was admitted and sent to  the emergency room where he was further evaluated.  His serum blood glucose  showed a blood sugar of 793.  The patient was given a dose of insulin and  his blood sugar was brought down to less than 500.  The patient was also  found to have symptoms of bronchitis.  He was started on IV fluids and  insulin according to sliding scale.  The patient was also started on IV  Zithromax.  He is admitted for further treatment.   PAST MEDICAL HISTORY:  1.  Diabetes mellitus type 2.  2.  Hypertension.  3.  History of bronchitis.   CURRENT MEDICATIONS:  1.  Lotrel 10/20 one tablet p.o. daily.  2.  Actos 30 mg p.o. daily.  3.  Glucovance 5/500 one tablet p.o. b.i.d.  4.  Aspirin 81 mg p.o. daily.   SOCIAL HISTORY:  The patient is married.  He works in a [employer].  The  patient smokes about 1 pack of cigarettes per day.  No history of alcohol or  substance abuse.   PHYSICAL EXAMINATION:  GENERAL:  The patient is alert, awake, and acutely  sick looking.  VITAL SIGNS:  Blood pressure 115/81, pulse 96, respiratory rate 20,  temperature 97.0 degrees Fahrenheit.  HEENT:  Pupils are equal  and reactive.  NECK:  Supple.  CHEST:  Decreased air entry with bilateral expiratory wheezes and rhonchi.  CARDIOVASCULAR SYSTEM:  First and second heart sounds heard, no murmur, no  rub.  ABDOMEN:  Soft and lax. Bowel sounds are positive.  No masses, no  organomegaly.  EXTREMITIES:  No leg edema.   LABS:  On admission CBC showed WBC 6.3, hemoglobin 14.3, hematocrit 41.7,  platelets 166.  Sodium 130, potassium 4.0, chloride 93, carbon dioxide 26,  glucose 193, BUN 16, creatinine 1.5, calcium 10.2.   ASSESSMENT:  1.  Uncontrolled diabetes mellitus, secondary to noncompliance, with his      blood sugar around 700.  2.  Acute bronchitis.  3.  Hypertension.   PLAN:  Will continue the patient on Accu-Check and sliding scale insulin  coverage.  Will continue him on Norvasc 100 mL/h.  We will  start the patient  on nebulizer treatment.  Will continue IV Zithromax.      Tesfaye D. Legrand Rams, MD  Electronically Signed     TDF/MEDQ  D:  07/27/2005  T:  07/27/2005  Job:  [id number]

## 2010-12-02 NOTE — Discharge Summary (Signed)
NAME:  [name] [name], AMEDEE NO.:  [id number]   MEDICAL RECORD NO.:  [id number]          PATIENT TYPE:  INP   LOCATION:  A339                          FACILITY:  [facility]   PHYSICIAN:  Tesfaye D. Legrand Rams, MD   DATE OF BIRTH:  Dec 05, 1950   DATE OF ADMISSION:  07/27/2005  DATE OF DISCHARGE:  01/13/2007LH                                 DISCHARGE SUMMARY   DISCHARGE DIAGNOSES:  1.  Poorly controlled diabetes mellitus.  2.  Acute bronchitis.  3.  Hypertension.   DISCHARGE MEDICATIONS:  1.  Lantus insulin 20 units subcu nightly.  2.  Z-Pack as directed.  3.  Lotrel 10/20 one tablet p.o. b.i.d.  4.  Actos 30 mg p.o. daily.  5.  Glucovance 5/500 one tablet b.i.d.  6.  Aspirin 81 mg daily.   DISPOSITION:  The patient was discharged home in stable condition.   HOSPITAL COURSE:  This is a 60 year old male patient with history of  multiple medical illnesses who was admitted due to cough, congestion and  shortness of breath. The patient was also found to have a blood sugar of  793. He was given insulin and was started on IV antibiotics. Over the  hospital stay, the patient improved. He was started on Lantus insulin in  addition to his oral hypoglycemic agents. The patient was back to his  baseline. He was discharged home in stable condition to continue his regular  treatment.      Tesfaye D. Legrand Rams, MD  Electronically Signed     TDF/MEDQ  D:  09/05/2005  T:  09/05/2005  Job:  [id number]

## 2010-12-15 ENCOUNTER — Other Ambulatory Visit: Payer: Self-pay | Admitting: *Deleted

## 2010-12-15 DIAGNOSIS — G8929 Other chronic pain: Secondary | ICD-10-CM

## 2010-12-15 MED ORDER — HYDROCODONE-ACETAMINOPHEN 5-325 MG PO TABS: 1.0000 | ORAL_TABLET | ORAL | Status: DC | PRN

## 2010-12-22 HISTORY — PX: EYE SURGERY: SHX253

## 2011-01-01 ENCOUNTER — Emergency Department (HOSPITAL_COMMUNITY)
Admission: EM | Admit: 2011-01-01 | Discharge: 2011-01-01 | Disposition: A | Discharge status: Home / Self Care | Payer: Medicare PPO | Attending: Emergency Medicine | Admitting: Emergency Medicine

## 2011-01-01 DIAGNOSIS — E119 Type 2 diabetes mellitus without complications: Secondary | ICD-10-CM | POA: Insufficient documentation

## 2011-01-01 DIAGNOSIS — T148 Other injury of unspecified body region: Secondary | ICD-10-CM | POA: Insufficient documentation

## 2011-01-01 DIAGNOSIS — Z794 Long term (current) use of insulin: Secondary | ICD-10-CM | POA: Insufficient documentation

## 2011-01-01 DIAGNOSIS — W57XXXA Bitten or stung by nonvenomous insect and other nonvenomous arthropods, initial encounter: Secondary | ICD-10-CM | POA: Insufficient documentation

## 2011-01-01 DIAGNOSIS — J45909 Unspecified asthma, uncomplicated: Secondary | ICD-10-CM | POA: Insufficient documentation

## 2011-01-01 DIAGNOSIS — I10 Essential (primary) hypertension: Secondary | ICD-10-CM | POA: Insufficient documentation

## 2011-01-01 DIAGNOSIS — E785 Hyperlipidemia, unspecified: Secondary | ICD-10-CM | POA: Insufficient documentation

## 2011-01-30 ENCOUNTER — Other Ambulatory Visit: Payer: Self-pay | Admitting: *Deleted

## 2011-01-30 DIAGNOSIS — G8929 Other chronic pain: Secondary | ICD-10-CM

## 2011-01-30 MED ORDER — HYDROCODONE-ACETAMINOPHEN 5-325 MG PO TABS: 1.0000 | ORAL_TABLET | ORAL | Status: DC | PRN

## 2011-03-06 ENCOUNTER — Emergency Department (HOSPITAL_COMMUNITY)
Admission: EM | Admit: 2011-03-06 | Discharge: 2011-03-06 | Disposition: A | Discharge status: Home / Self Care | Payer: Medicare PPO | Attending: Emergency Medicine | Admitting: Emergency Medicine

## 2011-03-06 ENCOUNTER — Encounter (HOSPITAL_COMMUNITY): Payer: Self-pay | Admitting: *Deleted

## 2011-03-06 DIAGNOSIS — M545 Low back pain, unspecified: Secondary | ICD-10-CM | POA: Insufficient documentation

## 2011-03-06 DIAGNOSIS — I10 Essential (primary) hypertension: Secondary | ICD-10-CM | POA: Insufficient documentation

## 2011-03-06 DIAGNOSIS — S39012A Strain of muscle, fascia and tendon of lower back, initial encounter: Secondary | ICD-10-CM

## 2011-03-06 DIAGNOSIS — E785 Hyperlipidemia, unspecified: Secondary | ICD-10-CM | POA: Insufficient documentation

## 2011-03-06 DIAGNOSIS — J4489 Other specified chronic obstructive pulmonary disease: Secondary | ICD-10-CM | POA: Insufficient documentation

## 2011-03-06 DIAGNOSIS — E119 Type 2 diabetes mellitus without complications: Secondary | ICD-10-CM | POA: Insufficient documentation

## 2011-03-06 DIAGNOSIS — K219 Gastro-esophageal reflux disease without esophagitis: Secondary | ICD-10-CM | POA: Insufficient documentation

## 2011-03-06 DIAGNOSIS — J449 Chronic obstructive pulmonary disease, unspecified: Secondary | ICD-10-CM | POA: Insufficient documentation

## 2011-03-06 MED ORDER — OXYCODONE-ACETAMINOPHEN 5-325 MG PO TABS: 2.0000 | ORAL_TABLET | ORAL | Status: AC | PRN

## 2011-03-06 MED ORDER — IBUPROFEN 600 MG PO TABS: 600.0000 mg | ORAL_TABLET | Freq: Four times a day (QID) | ORAL | Status: AC | PRN

## 2011-03-06 MED ORDER — METHOCARBAMOL 500 MG PO TABS: 500.0000 mg | ORAL_TABLET | Freq: Two times a day (BID) | ORAL | Status: AC
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
## 2011-03-06 NOTE — ED Notes (Signed)
md at bs to evaluate.

## 2011-03-06 NOTE — ED Notes (Signed)
Elf ambulated out with a  Steady gait stating no needs
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
## 2011-03-15 ENCOUNTER — Ambulatory Visit: Payer: Medicare PPO | Admitting: Orthopedic Surgery

## 2011-03-15 ENCOUNTER — Encounter: Payer: Self-pay | Admitting: Orthopedic Surgery

## 2011-03-15 ENCOUNTER — Ambulatory Visit (INDEPENDENT_AMBULATORY_CARE_PROVIDER_SITE_OTHER): Payer: Medicare PPO | Admitting: Orthopedic Surgery

## 2011-03-15 DIAGNOSIS — IMO0002 Reserved for concepts with insufficient information to code with codable children: Secondary | ICD-10-CM

## 2011-03-15 DIAGNOSIS — G8929 Other chronic pain: Secondary | ICD-10-CM

## 2011-03-15 DIAGNOSIS — G5600 Carpal tunnel syndrome, unspecified upper limb: Secondary | ICD-10-CM

## 2011-03-15 DIAGNOSIS — M1710 Unilateral primary osteoarthritis, unspecified knee: Secondary | ICD-10-CM

## 2011-03-15 DIAGNOSIS — M179 Osteoarthritis of knee, unspecified: Secondary | ICD-10-CM

## 2011-03-15 MED ORDER — HYDROCODONE-ACETAMINOPHEN 5-325 MG PO TABS: 1.0000 | ORAL_TABLET | ORAL | Status: DC | PRN

## 2011-03-15 MED ORDER — GABAPENTIN 100 MG PO CAPS: 100.0000 mg | ORAL_CAPSULE | Freq: Three times a day (TID) | ORAL | Status: DC

## 2011-03-15 MED ORDER — PYRIDOXINE HCL 100 MG PO TABS: ORAL_TABLET | ORAL | Status: DC

## 2011-03-15 NOTE — Progress Notes (Signed)
   Followup visit  Routine followup for 60 year old male with osteoarthritis LEFT knee status post LEFT knee arthroscopy and partial medial meniscectomy.  Currently on hydrocodone for pain  The patient pain seems to come and go is related to weather.  He occasionally feels catching and locking of the knee.  Review of systems he has pain in his RIGHT index finger PIP joint with swelling.  He has carpal tunnel symptoms of numbness and tingling weakness of his LEFT extremity recently had carpal tunnel touch with was positive.  He was treated with a brace no medication.  His exam as follows his general parents was normal He was oriented x3 His mood and affect are normal His gait was normal His LEFT knee was nontender with no swelling and 120 of flexion.  The knee was stable strength is normal skin was intact.  Pulses and temperature were normal sensation was intact there were no pathologic reflexes.  RIGHT index finger PIP joint swelling crepitance normal range of motion.  Imaging: X-rays LEFT knee 3 views summary: Moderate joint space narrowing medial compartment more severe than x-ray January 2011.  Alignment slight varus.  Mild patellofemoral arthritis.  Assessment #1 osteoarthritis LEFT knee #2 carpal tunnel syndrome LEFT hand #3 osteoarthritis PIP joint RIGHT index finger  Plan x-ray in 6 months LEFT knee.  Start medication for carpal tunnel syndrome, set up appointment to evaluate carpal tunnel syndrome with nerve conduction study from neurologist.  Continue hydrocodone 5 mg for pain

## 2011-03-15 NOTE — Patient Instructions (Addendum)
Set uo 2 appointments  1. For Carpal tunnel evaluation (get test results from dr. [name])   2. 6 mos x-rays left knee

## 2011-03-26 ENCOUNTER — Emergency Department (HOSPITAL_COMMUNITY): Payer: Medicare Other

## 2011-03-26 ENCOUNTER — Encounter (HOSPITAL_COMMUNITY): Payer: Self-pay | Admitting: *Deleted

## 2011-03-26 DIAGNOSIS — Z79899 Other long term (current) drug therapy: Secondary | ICD-10-CM | POA: Insufficient documentation

## 2011-03-26 DIAGNOSIS — J449 Chronic obstructive pulmonary disease, unspecified: Secondary | ICD-10-CM | POA: Insufficient documentation

## 2011-03-26 DIAGNOSIS — I10 Essential (primary) hypertension: Secondary | ICD-10-CM | POA: Insufficient documentation

## 2011-03-26 DIAGNOSIS — E119 Type 2 diabetes mellitus without complications: Secondary | ICD-10-CM | POA: Insufficient documentation

## 2011-03-26 DIAGNOSIS — K219 Gastro-esophageal reflux disease without esophagitis: Secondary | ICD-10-CM | POA: Insufficient documentation

## 2011-03-26 DIAGNOSIS — J4489 Other specified chronic obstructive pulmonary disease: Secondary | ICD-10-CM | POA: Insufficient documentation

## 2011-03-26 DIAGNOSIS — E785 Hyperlipidemia, unspecified: Secondary | ICD-10-CM | POA: Insufficient documentation

## 2011-03-26 DIAGNOSIS — S60459A Superficial foreign body of unspecified finger, initial encounter: Secondary | ICD-10-CM | POA: Insufficient documentation

## 2011-03-26 DIAGNOSIS — Z794 Long term (current) use of insulin: Secondary | ICD-10-CM | POA: Insufficient documentation

## 2011-03-26 DIAGNOSIS — X58XXXA Exposure to other specified factors, initial encounter: Secondary | ICD-10-CM | POA: Insufficient documentation

## 2011-03-26 DIAGNOSIS — Z87891 Personal history of nicotine dependence: Secondary | ICD-10-CM | POA: Insufficient documentation

## 2011-03-26 NOTE — ED Notes (Signed)
Pt states the needle from his pin needle for insulin broke off in his thumb on Friday night.

## 2011-03-27 ENCOUNTER — Emergency Department (HOSPITAL_COMMUNITY)
Admission: EM | Admit: 2011-03-27 | Discharge: 2011-03-27 | Disposition: A | Discharge status: Home / Self Care | Payer: Medicare Other | Attending: Emergency Medicine | Admitting: Emergency Medicine

## 2011-03-27 ENCOUNTER — Emergency Department (HOSPITAL_COMMUNITY): Payer: Medicare Other

## 2011-03-27 DIAGNOSIS — IMO0002 Reserved for concepts with insufficient information to code with codable children: Secondary | ICD-10-CM

## 2011-03-27 MED ORDER — OXYCODONE-ACETAMINOPHEN 5-325 MG PO TABS: 2.0000 | ORAL_TABLET | ORAL | Status: DC | PRN

## 2011-03-27 MED ORDER — OXYCODONE-ACETAMINOPHEN 5-325 MG PO TABS: 1.0000 | ORAL_TABLET | Freq: Once | ORAL | Status: DC

## 2011-03-27 MED ORDER — AMOXICILLIN 500 MG PO CAPS: 500.0000 mg | ORAL_CAPSULE | Freq: Three times a day (TID) | ORAL | Status: AC

## 2011-03-27 MED ORDER — OXYCODONE-ACETAMINOPHEN 5-325 MG PO TABS
ORAL_TABLET | ORAL | Status: AC
  Administered 2011-03-27: 1
  Filled 2011-03-27: qty 1

## 2011-03-27 MED ORDER — TETANUS-DIPHTHERIA TOXOIDS TD 5-2 LFU IM INJ
0.5000 mL | INJECTION | Freq: Once | INTRAMUSCULAR | Status: AC
  Administered 2011-03-27: 0.5 mL via INTRAMUSCULAR
  Filled 2011-03-27: qty 0.5

## 2011-03-27 MED ORDER — LIDOCAINE HCL (PF) 2 % IJ SOLN
INTRAMUSCULAR | Status: AC
  Administered 2011-03-27: 03:00:00
  Filled 2011-03-27: qty 10

## 2011-03-27 MED ORDER — LIDOCAINE HCL (PF) 1 % IJ SOLN
5.0000 mL | Freq: Once | INTRAMUSCULAR | Status: AC
  Administered 2011-03-27: 5 mL via INTRADERMAL
  Filled 2011-03-27: qty 5

## 2011-03-27 NOTE — ED Provider Notes (Signed)
History     CSN: [id number] Arrival date & time: 03/27/2011 12:28 AM  Chief Complaint  Patient presents with  . Foreign Body in Skin   Patient is a 60 y.o. male presenting with foreign body.  Foreign Body  The current episode started 1 to 2 hours ago. Intake: needle in R thumb. Suspected object: needle. The incident was witnessed/reported by the patient. Pertinent negatives include no chest pain, no fever, no abdominal pain and no vomiting. He has received no recent medical care.   PT was injecting insulin at home, was recapping a bent needle and the needle broke off into his R thumb. He was unable to get it out at home and presents here.  He has pain and FB sensation. Hurts to touch, alleviated by nothing. Pain is moderate. No associated redness or symptoms otherwsie.   Past Medical History  Diagnosis Date  . COPD (chronic obstructive pulmonary disease)   . Sinusitis   . PUD (peptic ulcer disease)     remote, reports f/u EGD about 8 years ago unremarkable   . Diabetes   . GERD (gastroesophageal reflux disease)   . Hyperlipidemia   . Hypertension     Past Surgical History  Procedure Date  . Asad lt shoulder   . Umbilical hernia repair   . Knee arthroscopy     left    Family History  Problem Relation Age of Onset  . Hypertension Mother     MI  . Cancer Mother     breast   . Diabetes Mother   . Diabetes Father   . Hypertension Father   . Hypertension Sister   . Diabetes Sister     History  Substance Use Topics  . Smoking status: Former Research scientist (life sciences)  . Smokeless tobacco: Not on file  . Alcohol Use: No      Review of Systems  Constitutional: Negative for fever and chills.  HENT: Negative for neck pain and neck stiffness.   Eyes: Negative for pain.  Respiratory: Negative for shortness of breath.   Cardiovascular: Negative for chest pain.  Gastrointestinal: Negative for vomiting and abdominal pain.  Genitourinary: Negative for dysuria.  Musculoskeletal: Negative  for back pain.  Skin: Positive for wound. Negative for rash.  Neurological: Negative for headaches.  All other systems reviewed and are negative.    Physical Exam  BP 144/93  Pulse 76  Temp(Src) 99.1 F (37.3 C) (Oral)  Resp 20  Ht 5\' 4"  (1.626 m)  Wt 226 lb (102.513 kg)  BMI 38.79 kg/m2  SpO2 97%  Physical Exam  Constitutional: He is oriented to person, place, and time. He appears well-developed and well-nourished.  HENT:  Head: Normocephalic and atraumatic.  Eyes: Conjunctivae and EOM are normal. Pupils are equal, round, and reactive to light.  Neck: Full passive range of motion without pain. Neck supple. No thyromegaly present.       No meningismus  Cardiovascular: Normal rate, regular rhythm, S1 normal, S2 normal and intact distal pulses.   Pulmonary/Chest: Effort normal and breath sounds normal.  Abdominal: Soft. Bowel sounds are normal. There is no tenderness. There is no CVA tenderness.  Musculoskeletal: Normal range of motion.       R thumb: TTP with small area of skin break over pad of the thumb, no palpable FB. No erythema or streaking and distal N/V intact. No active bleeding  Neurological: He is alert and oriented to person, place, and time. He has normal strength and normal reflexes. No  cranial nerve deficit or sensory deficit. He displays a negative Romberg sign. GCS eye subscore is 4. GCS verbal subscore is 5. GCS motor subscore is 6.       Normal Gait  Skin: Skin is warm and dry. No rash noted. No cyanosis. Nails show no clubbing.  Psychiatric: He has a normal mood and affect. His speech is normal and behavior is normal.    ED Course  FOREIGN BODY REMOVAL Date/Time: 03/27/2011 1:00 AM Performed by: Teressa Lower Authorized by: Teressa Lower Consent: Verbal consent obtained. Risks and benefits: risks, benefits and alternatives were discussed Consent given by: patient Patient understanding: patient states understanding of the procedure being performed Patient  consent: the patient's understanding of the procedure matches consent given Procedure consent: procedure consent matches procedure scheduled Patient identity confirmed: verbally with patient Time out: Immediately prior to procedure a "time out" was called to verify the correct patient, procedure, equipment, support staff and site/side marked as required. Intake: R thumb. Anesthesia: digital block Local anesthetic: lidocaine 1% without epinephrine Anesthetic total: 3 ml Complexity: complex Number of foreign bodies recovered: none. Post-procedure assessment: foreign body not removed Patient tolerance: Patient tolerated the procedure well with no immediate complications. Comments: #11 blade used to make small incision in skin at area of needle entry. Unable to visualize FB. After d/w DR Luna Glasgow who recommended triangulation to better identify on plain films, PT was sent over for repeat imaging that demonstarted retained FB.  Magnet was used and forceps to explore wound with no success. A small piece of ? FB was trieed so 3 set of xrays obtained/ reviewed and FB remains      MDM  Multiple attempts to extract FB after finger block. I discussed case with DR Luna Glasgow at 0245am and he recs triangulate and re-attempt and if not successful will see him in the am in clinic. I was unable to extract FB as above. Plan d/c home pain meds and Abx and follow up today in clinic.   Dg Finger Thumb Right  03/26/2011  *RADIOLOGY REPORT*  Clinical Data: Needle from insulin pin broke off in thumb; right thumb pain.  RIGHT THUMB 2+V  Comparison: None.  Findings: A 5 mm thin fragment of metal is noted within the volar aspect of the distal tip of the first digit, compatible with the needle as clinically described.  This is 3-4 mm deep to the skin surface.  There is no evidence of fracture; no additional radiopaque foreign bodies are seen.  Visualized joint spaces are preserved.  IMPRESSION: 5 mm thin fragment of metal  noted within the volar aspect of the distal tip of the first digit, reflecting the needle as clinically described.  This is 3-4 mm deep to the skin surface.  Original Report Authenticated By: Santa Lighter, M.D.       Teressa Lower, MD 03/27/11 [id number]

## 2011-03-28 ENCOUNTER — Telehealth: Payer: Self-pay | Admitting: Orthopedic Surgery

## 2011-03-28 ENCOUNTER — Encounter: Payer: Self-pay | Admitting: Orthopedic Surgery

## 2011-03-28 ENCOUNTER — Encounter (HOSPITAL_COMMUNITY): Payer: Self-pay

## 2011-03-28 ENCOUNTER — Other Ambulatory Visit: Payer: Self-pay

## 2011-03-28 ENCOUNTER — Ambulatory Visit (INDEPENDENT_AMBULATORY_CARE_PROVIDER_SITE_OTHER): Payer: PRIVATE HEALTH INSURANCE | Admitting: Orthopedic Surgery

## 2011-03-28 ENCOUNTER — Encounter (HOSPITAL_COMMUNITY)
Admission: RE | Admit: 2011-03-28 | Discharge: 2011-03-28 | Disposition: A | Discharge status: Home / Self Care | Payer: Medicare Other | Source: Ambulatory Visit | Attending: Orthopedic Surgery | Admitting: Orthopedic Surgery

## 2011-03-28 VITALS — Resp 16 | Ht 64.5 in | Wt 232.0 lb

## 2011-03-28 DIAGNOSIS — S60459A Superficial foreign body of unspecified finger, initial encounter: Secondary | ICD-10-CM

## 2011-03-28 DIAGNOSIS — S60351A Superficial foreign body of right thumb, initial encounter: Secondary | ICD-10-CM | POA: Insufficient documentation

## 2011-03-28 LAB — DIFFERENTIAL
Basophils Absolute: 0 10*3/uL (ref 0.0–0.1)
Basophils Relative: 1 % (ref 0–1)
Monocytes Absolute: 0.2 10*3/uL (ref 0.1–1.0)
Neutro Abs: 2.2 10*3/uL (ref 1.7–7.7)
Neutrophils Relative %: 57 % (ref 43–77)

## 2011-03-28 LAB — CBC
HCT: 36.9 % — ABNORMAL LOW (ref 39.0–52.0)
MCHC: 33.1 g/dL (ref 30.0–36.0)
RDW: 14.4 % (ref 11.5–15.5)

## 2011-03-28 LAB — BASIC METABOLIC PANEL
Chloride: 98 mEq/L (ref 96–112)
Creatinine, Ser: 1.62 mg/dL — ABNORMAL HIGH (ref 0.50–1.35)
GFR calc Af Amer: 53 mL/min — ABNORMAL LOW (ref >60)
GFR calc non Af Amer: 44 mL/min — ABNORMAL LOW (ref >60)

## 2011-03-28 LAB — SURGICAL PCR SCREEN: MRSA, PCR: NEGATIVE

## 2011-03-28 NOTE — Telephone Encounter (Signed)
Contacted insurer, [employer], ph # [phone] re: out-patient surgery scheduled 03/29/11 at [facility], [place], ICD9 729.6. Spoke with Tollie Pizza. No pre-authorization required for this procedure; her name and today's date for reference.

## 2011-03-28 NOTE — Patient Instructions (Addendum)
[place]  03/28/2011   Your procedure is scheduled on:  03/29/2011  Report to [facility] at  80  AM.  Call this number if you have problems the morning of surgery: [phone]   Remember:   Do not eat food:After Midnight.  Do not drink clear liquids: After Midnight.  Take these medicines the morning of surgery with A SIP OF WATER: oxycodone,amlodipine,prevacid,prilosec.Take spiriva and albuterol before you come. Take insulin   Do not wear jewelry, make-up or nail polish.  Do not wear lotions, powders, or perfumes. You may wear deodorant.  Do not shave 48 hours prior to surgery.  Do not bring valuables to the hospital.  Contacts, dentures or bridgework may not be worn into surgery.  Leave suitcase in the car. After surgery it may be brought to your room.  For patients admitted to the hospital, checkout time is 11:00 AM the day of discharge.   Patients discharged the day of surgery will not be allowed to drive home.  Name and phone number of your driver: family  Special Instructions: CHG Shower Use Special Wash: 1/2 bottle night before surgery and 1/2 bottle morning of surgery.   Please read over the following fact sheets that you were given: Pain Booklet, MRSA Information, Surgical Site Infection Prevention, Anesthesia Post-op Instructions and Care and Recovery After Surgery PATIENT INSTRUCTIONS POST-ANESTHESIA  IMMEDIATELY FOLLOWING SURGERY:  Do not drive or operate machinery for the first twenty four hours after surgery.  Do not make any important decisions for twenty four hours after surgery or while taking narcotic pain medications or sedatives.  If you develop intractable nausea and vomiting or a severe headache please notify your doctor immediately.  FOLLOW-UP:  Please make an appointment with your surgeon as instructed. You do not need to follow up with anesthesia unless specifically instructed to do so.  WOUND CARE INSTRUCTIONS (if applicable):  Keep a dry clean dressing  on the anesthesia/puncture wound site if there is drainage.  Once the wound has quit draining you may leave it open to air.  Generally you should leave the bandage intact for twenty four hours unless there is drainage.  If the epidural site drains for more than 36-48 hours please call the anesthesia department.  QUESTIONS?:  Please feel free to call your physician or the hospital operator if you have any questions, and they will be happy to assist you.     [place] [phone]

## 2011-03-28 NOTE — Progress Notes (Signed)
A 60 year old male foreign body to his thumb on September 6. The patient was giving himself an insulin and the needle broke off in his thumb when his hand slipped.  In the emergency room several attempts to get the foreign body out were unsuccessful he presents now for evaluation.  He has constant 5/10 pain with swelling no surrounding cellulitis is seen.  There is some tenderness.  The patient is on amoxicillin and oxycodone for pain.  Review of systems he has a history of some fatigue, ordering of the eyes, shortness of breath, wheezing, cough, tightness of the chest and snoring.  He has a history of heartburn frequency muscle pain stiffness joint swelling I follow him for his shoulder and knee.  He has rash and itching of the skin on occasion he has some numbness and tingling as well.  Has nervousness and depression.  The other systems reviewed were negative including no chest pain excessive thirst excessive bleeding or seasonal ALLERGY.  Past Medical History  Diagnosis Date  . COPD (chronic obstructive pulmonary disease)   . Sinusitis   . PUD (peptic ulcer disease)     remote, reports f/u EGD about 8 years ago unremarkable   . Diabetes   . GERD (gastroesophageal reflux disease)   . Hyperlipidemia   . Hypertension    Past Surgical History  Procedure Date  . Asad lt shoulder   . Umbilical hernia repair   . Knee arthroscopy     left  . Eye surgery    History   Social History  . Marital Status: Married    Spouse Name: N/A    Number of Children: 2  . Years of Education: 12th grade   Occupational History  . unemployed    . 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     Social History Main Topics  . Smoking status: Former Research scientist (life sciences)  . Smokeless tobacco: Not on file  . Alcohol Use: No  . Drug Use: No  . Sexually Active: Not on file   Other Topics Concern  . Not on file   Social History Narrative  . No narrative on file    Physical Exam(12) GENERAL: normal development   CDV: pulses are normal   Skin: normal  Lymph: nodes were not palpable/normal  Psychiatric: awake, alert and oriented  Neuro: normal sensation  MSK RIGHT thumb. 1 No swelling but tenderness and there is an incision over the volar aspect of the RIGHT thumb 2 Flexion at the IP joint normal opposition normal 3 Pinch strength normal 4 Joint stability stable 5 No cellulitis or streaking up the arm 6 Elbow and shoulder normal  Imaging x-rays show foreign body small needle RIGHT thumb  Assessment: Form body RIGHT thumb    Plan: Recommend surgical removal

## 2011-03-28 NOTE — Patient Instructions (Addendum)
Removal of pin right thumb:   Minimal complications expected:  possible infection   Nerve damage

## 2011-03-29 ENCOUNTER — Encounter (HOSPITAL_COMMUNITY)
Admission: RE | Disposition: A | Discharge status: Home / Self Care | Payer: Self-pay | Source: Ambulatory Visit | Attending: Orthopedic Surgery

## 2011-03-29 ENCOUNTER — Ambulatory Visit (HOSPITAL_COMMUNITY): Payer: Medicare Other | Admitting: Anesthesiology

## 2011-03-29 ENCOUNTER — Ambulatory Visit (HOSPITAL_COMMUNITY): Payer: Medicare Other

## 2011-03-29 ENCOUNTER — Encounter (HOSPITAL_COMMUNITY): Payer: Self-pay | Admitting: *Deleted

## 2011-03-29 ENCOUNTER — Ambulatory Visit (HOSPITAL_COMMUNITY)
Admission: RE | Admit: 2011-03-29 | Discharge: 2011-03-29 | Disposition: A | Discharge status: Home / Self Care | Payer: Medicare Other | Source: Ambulatory Visit | Attending: Orthopedic Surgery | Admitting: Orthopedic Surgery

## 2011-03-29 ENCOUNTER — Encounter (HOSPITAL_COMMUNITY): Payer: Self-pay | Admitting: Anesthesiology

## 2011-03-29 DIAGNOSIS — S60351A Superficial foreign body of right thumb, initial encounter: Secondary | ICD-10-CM

## 2011-03-29 DIAGNOSIS — Z01810 Encounter for preprocedural cardiovascular examination: Secondary | ICD-10-CM | POA: Insufficient documentation

## 2011-03-29 DIAGNOSIS — I10 Essential (primary) hypertension: Secondary | ICD-10-CM | POA: Insufficient documentation

## 2011-03-29 DIAGNOSIS — S60459A Superficial foreign body of unspecified finger, initial encounter: Secondary | ICD-10-CM

## 2011-03-29 DIAGNOSIS — Z79899 Other long term (current) drug therapy: Secondary | ICD-10-CM | POA: Insufficient documentation

## 2011-03-29 DIAGNOSIS — J4489 Other specified chronic obstructive pulmonary disease: Secondary | ICD-10-CM | POA: Insufficient documentation

## 2011-03-29 DIAGNOSIS — E119 Type 2 diabetes mellitus without complications: Secondary | ICD-10-CM | POA: Insufficient documentation

## 2011-03-29 DIAGNOSIS — Z794 Long term (current) use of insulin: Secondary | ICD-10-CM | POA: Insufficient documentation

## 2011-03-29 DIAGNOSIS — X58XXXA Exposure to other specified factors, initial encounter: Secondary | ICD-10-CM | POA: Insufficient documentation

## 2011-03-29 DIAGNOSIS — Y92009 Unspecified place in unspecified non-institutional (private) residence as the place of occurrence of the external cause: Secondary | ICD-10-CM | POA: Insufficient documentation

## 2011-03-29 DIAGNOSIS — Z01812 Encounter for preprocedural laboratory examination: Secondary | ICD-10-CM | POA: Insufficient documentation

## 2011-03-29 DIAGNOSIS — J449 Chronic obstructive pulmonary disease, unspecified: Secondary | ICD-10-CM | POA: Insufficient documentation

## 2011-03-29 DIAGNOSIS — S61209A Unspecified open wound of unspecified finger without damage to nail, initial encounter: Secondary | ICD-10-CM | POA: Insufficient documentation

## 2011-03-29 HISTORY — PX: FOREIGN BODY REMOVAL: SHX962

## 2011-03-29 LAB — GLUCOSE, CAPILLARY
Glucose-Capillary: 190 mg/dL — ABNORMAL HIGH (ref 70–99)
Glucose-Capillary: 168 mg/dL — ABNORMAL HIGH (ref 70–99)

## 2011-03-29 SURGERY — REMOVAL FOREIGN BODY EXTREMITY
Anesthesia: Regional | Site: Finger | Laterality: Right | Wound class: Contaminated

## 2011-03-29 MED ORDER — ACETAMINOPHEN 500 MG PO TABS
500.0000 mg | ORAL_TABLET | Freq: Once | ORAL | Status: AC
  Administered 2011-03-29: 500 mg via ORAL

## 2011-03-29 MED ORDER — CEFAZOLIN SODIUM 1-5 GM-% IV SOLN
INTRAVENOUS | Status: AC
  Filled 2011-03-29: qty 50

## 2011-03-29 MED ORDER — FENTANYL CITRATE 0.05 MG/ML IJ SOLN
INTRAMUSCULAR | Status: AC
  Administered 2011-03-29: 50 ug via INTRAVENOUS
  Filled 2011-03-29: qty 2

## 2011-03-29 MED ORDER — OXYCODONE-ACETAMINOPHEN 5-325 MG PO TABS: 1.0000 | ORAL_TABLET | ORAL | Status: AC | PRN

## 2011-03-29 MED ORDER — OXYCODONE-ACETAMINOPHEN 5-325 MG PO TABS
ORAL_TABLET | ORAL | Status: AC
  Administered 2011-03-29: 1 via ORAL
  Filled 2011-03-29: qty 1

## 2011-03-29 MED ORDER — BUPIVACAINE HCL (PF) 0.25 % IJ SOLN
INTRAMUSCULAR | Status: AC
  Filled 2011-03-29: qty 30

## 2011-03-29 MED ORDER — ONDANSETRON HCL 4 MG/2ML IJ SOLN: 4.0000 mg | Freq: Once | INTRAMUSCULAR | Status: DC | PRN

## 2011-03-29 MED ORDER — LACTATED RINGERS IV SOLN: INTRAVENOUS | Status: DC

## 2011-03-29 MED ORDER — ACETAMINOPHEN 500 MG PO TABS
ORAL_TABLET | ORAL | Status: AC
  Administered 2011-03-29: 500 mg via ORAL
  Filled 2011-03-29: qty 1

## 2011-03-29 MED ORDER — OXYCODONE-ACETAMINOPHEN 5-325 MG PO TABS
1.0000 | ORAL_TABLET | Freq: Once | ORAL | Status: AC
  Administered 2011-03-29: 1 via ORAL

## 2011-03-29 MED ORDER — FENTANYL CITRATE 0.05 MG/ML IJ SOLN
INTRAMUSCULAR | Status: DC | PRN
  Administered 2011-03-29: 100 ug via INTRAVENOUS

## 2011-03-29 MED ORDER — ONDANSETRON HCL 4 MG/2ML IJ SOLN
INTRAMUSCULAR | Status: AC
  Administered 2011-03-29: 4 mg via INTRAVENOUS
  Filled 2011-03-29: qty 2

## 2011-03-29 MED ORDER — CEFAZOLIN SODIUM 1-5 GM-% IV SOLN
1.0000 g | INTRAVENOUS | Status: AC
  Administered 2011-03-29: 1 g via INTRAVENOUS

## 2011-03-29 MED ORDER — MIDAZOLAM HCL 2 MG/2ML IJ SOLN
1.0000 mg | INTRAMUSCULAR | Status: DC | PRN
  Administered 2011-03-29: 2 mg via INTRAVENOUS

## 2011-03-29 MED ORDER — HYDROMORPHONE HCL 1 MG/ML IJ SOLN
1.0000 mg | Freq: Once | INTRAMUSCULAR | Status: AC
  Administered 2011-03-29: 1 mg via INTRAVENOUS

## 2011-03-29 MED ORDER — LACTATED RINGERS IV SOLN
INTRAVENOUS | Status: DC
  Administered 2011-03-29: 1000 mL via INTRAVENOUS

## 2011-03-29 MED ORDER — BUPIVACAINE HCL (PF) 0.25 % IJ SOLN
INTRAMUSCULAR | Status: DC | PRN
  Administered 2011-03-29: 10 mL

## 2011-03-29 MED ORDER — LIDOCAINE HCL (PF) 0.5 % IJ SOLN
INTRAMUSCULAR | Status: DC | PRN
  Administered 2011-03-29: 50 mL

## 2011-03-29 MED ORDER — PROPOFOL 10 MG/ML IV EMUL
INTRAVENOUS | Status: DC | PRN
  Administered 2011-03-29: 25 ug/kg/min via INTRAVENOUS

## 2011-03-29 MED ORDER — FENTANYL CITRATE 0.05 MG/ML IJ SOLN
25.0000 ug | INTRAMUSCULAR | Status: DC | PRN
  Administered 2011-03-29 (×2): 50 ug via INTRAVENOUS

## 2011-03-29 MED ORDER — MIDAZOLAM HCL 2 MG/2ML IJ SOLN
INTRAMUSCULAR | Status: AC
  Administered 2011-03-29: 2 mg via INTRAVENOUS
  Filled 2011-03-29: qty 2

## 2011-03-29 MED ORDER — ONDANSETRON HCL 4 MG/2ML IJ SOLN
4.0000 mg | Freq: Once | INTRAMUSCULAR | Status: AC
  Administered 2011-03-29: 4 mg via INTRAVENOUS

## 2011-03-29 MED ORDER — SODIUM CHLORIDE 0.9 % IR SOLN
Status: DC | PRN
  Administered 2011-03-29: 1000 mL

## 2011-03-29 MED ORDER — SODIUM CHLORIDE 0.9 % IJ SOLN
INTRAMUSCULAR | Status: AC
  Filled 2011-03-29: qty 10

## 2011-03-29 MED ORDER — HYDROMORPHONE HCL 1 MG/ML IJ SOLN
INTRAMUSCULAR | Status: AC
  Administered 2011-03-29: 1 mg via INTRAVENOUS
  Filled 2011-03-29: qty 1

## 2011-03-29 SURGICAL SUPPLY — 42 items
BAG HAMPER (MISCELLANEOUS) ×2 IMPLANT
BANDAGE ELASTIC 4 VELCRO NS (GAUZE/BANDAGES/DRESSINGS) IMPLANT
BANDAGE ESMARK 4X12 BL STRL LF (DISPOSABLE) ×1 IMPLANT
BANDAGE GAUZE ELAST BULKY 4 IN (GAUZE/BANDAGES/DRESSINGS) IMPLANT
BNDG CMPR 12X4 ELC STRL LF (DISPOSABLE) ×2
BNDG COHESIVE 4X5 TAN NS LF (GAUZE/BANDAGES/DRESSINGS) IMPLANT
BNDG ESMARK 4X12 BLUE STRL LF (DISPOSABLE) ×4
CHLORAPREP W/TINT 26ML (MISCELLANEOUS) ×1 IMPLANT
CLOTH BEACON ORANGE TIMEOUT ST (SAFETY) ×2 IMPLANT
COVER LIGHT HANDLE STERIS (MISCELLANEOUS) ×4 IMPLANT
CUFF TOURNIQUET SINGLE 18IN (TOURNIQUET CUFF) ×2 IMPLANT
DECANTER SPIKE VIAL GLASS SM (MISCELLANEOUS) IMPLANT
DRSG TUBE GAUZE 5/8 (GAUZE/BANDAGES/DRESSINGS) ×1 IMPLANT
ELECT REM PT RETURN 9FT ADLT (ELECTROSURGICAL) ×2
ELECTRODE REM PT RTRN 9FT ADLT (ELECTROSURGICAL) ×1 IMPLANT
GAUZE XEROFORM 1X8 LF (GAUZE/BANDAGES/DRESSINGS) ×1 IMPLANT
GLOVE BIOGEL PI IND STRL 7.0 (GLOVE) IMPLANT
GLOVE BIOGEL PI INDICATOR 7.0 (GLOVE) ×3
GLOVE ECLIPSE 6.5 STRL STRAW (GLOVE) ×1 IMPLANT
GLOVE ECLIPSE 7.0 STRL STRAW (GLOVE) ×1 IMPLANT
GLOVE EXAM NITRILE MD LF STRL (GLOVE) ×1 IMPLANT
GLOVE SKINSENSE NS SZ8.0 LF (GLOVE) ×1
GLOVE SKINSENSE STRL SZ8.0 LF (GLOVE) ×1 IMPLANT
GLOVE SS N UNI LF 8.5 STRL (GLOVE) ×2 IMPLANT
GOWN BRE IMP SLV AUR XL STRL (GOWN DISPOSABLE) ×5 IMPLANT
GOWN STRL REIN XL XLG (GOWN DISPOSABLE) ×2 IMPLANT
KIT ROOM TURNOVER APOR (KITS) ×2 IMPLANT
MANIFOLD NEPTUNE II (INSTRUMENTS) ×2 IMPLANT
NS IRRIG 1000ML POUR BTL (IV SOLUTION) ×2 IMPLANT
PACK BASIC LIMB (CUSTOM PROCEDURE TRAY) ×2 IMPLANT
PAD ARMBOARD 7.5X6 YLW CONV (MISCELLANEOUS) ×2 IMPLANT
PAD CAST 4YDX4 CTTN HI CHSV (CAST SUPPLIES) IMPLANT
PADDING CAST COTTON 4X4 STRL (CAST SUPPLIES)
SET BASIN LINEN APH (SET/KITS/TRAYS/PACK) ×2 IMPLANT
SOL PREP PROV IODINE SCRUB 4OZ (MISCELLANEOUS) IMPLANT
SPONGE GAUZE 2X2 8PLY STRL LF (GAUZE/BANDAGES/DRESSINGS) ×2 IMPLANT
SPONGE GAUZE 4X4 12PLY (GAUZE/BANDAGES/DRESSINGS) ×1 IMPLANT
SUT ETHILON 3 0 FSL (SUTURE) ×1 IMPLANT
SWAB CULTURE LIQ STUART DBL (MISCELLANEOUS) IMPLANT
SYR 50ML LL SCALE MARK (SYRINGE) ×1 IMPLANT
SYR BULB IRRIGATION 50ML (SYRINGE) ×2 IMPLANT
TUBE ANAEROBIC PORT A CUL  W/M (MISCELLANEOUS) ×1 IMPLANT

## 2011-03-29 NOTE — Op Note (Signed)
Operative report  Date of surgery 03/29/2011  Preop diagnosis foreign body right thumb Postop diagnosis foreign body right thumb Procedure removal of foreign body right thumb Surgeon Aline Brochure Assisted by Laqueta Jean Anesthesia Bier block Operative findings small bore needle tip Details of procedure the patient was brought into [facility] because of retained foreign body in his right thumb since September 6. Attempts in the emergency room her unsuccessful by emergency room physician. Patient was seen in the office and scheduled for surgery.  Site marking was done in the preop area the chart was updated and reviewed  A gram of Ancef was administered and the patient was given a Bier block in the operating room  In the supine position the right arm was prepped with Betadine  We then draped the arm sterilely  An incision made in the emergency room was explored and with the assistance of a C-arm the needle was found and removed  Post removal C-arm x-ray showed the needle to be removed  The wound was irrigated with copious amounts of saline and closed with 3-0 nylon sutures with a total of 4 sutures in interrupted fashion  A digital block with 10 cc of quarter percent plain Marcaine was done  There were no complications  Patient was discharged to home with a followup scheduled for Monday

## 2011-03-29 NOTE — Brief Op Note (Addendum)
03/29/2011  1:44 PM  PATIENT:  [name] [name]  60 y.o. male  PRE-OPERATIVE DIAGNOSIS:  foreign body right thumb  POST-OPERATIVE DIAGNOSIS: foreign body right thumb  PROCEDURE:  Procedure(s): REMOVAL FOREIGN BODY RIGHT THUMB SURGEON:  Surgeon(s): Arther Abbott, MD  PHYSICIAN ASSISTANT:   ASSISTANTS: JUDY BURROUGHS   ANESTHESIA:   regional  OR FLUID I/O:  Total I/O In: 400 [I.V.:400] Out: -   BLOOD ADMINISTERED:none  DRAINS: none   LOCAL MEDICATIONS USED:  MARCAINE .25% PLAIN 10CC  SPECIMEN:  No Specimen  DISPOSITION OF SPECIMEN:  N/A  COUNTS:  YES  TOURNIQUET:  * Missing tourniquet times found for documented tourniquets in log:  3413 *  DICTATION: .Dragon Dictation  PLAN OF CARE: Discharge to home after PACU  PATIENT DISPOSITION:  PACU - hemodynamically stable.   Delay start of Pharmacological VTE agent (>24hrs) due to surgical blood loss or risk of bleeding:  not applicable

## 2011-03-29 NOTE — H&P (Signed)
Author  Note Status  Last Update User  Last Update Date/Time   Arther Abbott, MD  Signed  Arther Abbott, MD  03/28/2011 10:08 AM         Progress Notes     A 60 year old male foreign body to his thumb on September 6. The patient was giving himself an insulin and the needle broke off in his thumb when his hand slipped. In the emergency room several attempts to get the foreign body out were unsuccessful he presents now for evaluation. He has constant 5/10 pain with swelling no surrounding cellulitis is seen. There is some tenderness. The patient is on amoxicillin and oxycodone for pain.  Review of systems he has a history of some fatigue, ordering of the eyes, shortness of breath, wheezing, cough, tightness of the chest and snoring. He has a history of heartburn frequency muscle pain stiffness joint swelling I follow him for his shoulder and knee. He has rash and itching of the skin on occasion he has some numbness and tingling as well. Has nervousness and depression. The other systems reviewed were negative including no chest pain excessive thirst excessive bleeding or seasonal ALLERGY.     Past Medical History     Diagnosis  Date     .  COPD (chronic obstructive pulmonary disease)      .  Sinusitis      .  PUD (peptic ulcer disease)        remote, reports f/u EGD about 8 years ago unremarkable     .  Diabetes      .  GERD (gastroesophageal reflux disease)      .  Hyperlipidemia      .  Hypertension      Past Surgical History     Procedure  Date     .  Asad lt shoulder      .  Umbilical hernia repair      .  Knee arthroscopy        left     .  Eye surgery      History      Social History      .  Marital Status:  Married        Spouse Name:  N/A        Number of Children:  2      .  Years of Education:  12th grade      Occupational History      .  unemployed       .  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        Social  History Main Topics      .  Smoking status:  Former Research scientist (life sciences)      .  Smokeless tobacco:  Not on file      .  Alcohol Use:  No      .  Drug Use:  No      .  Sexually Active:  Not on file      Other Topics  Concern      .  Not on file      Social History Narrative      .  No narrative on file      Physical Exam(12)  GENERAL: normal development  CDV: pulses are normal  Skin: normal  Lymph: nodes were not palpable/normal  Psychiatric: awake, alert and oriented  Neuro: normal sensation  MSK RIGHT thumb.  1 No swelling but tenderness and there is an incision over the volar aspect of the RIGHT thumb  2 Flexion at the IP joint normal opposition normal  3 Pinch strength normal  4 Joint stability stable  5 No cellulitis or streaking up the arm  6 Elbow and shoulder normal  Imaging x-rays show foreign body small needle RIGHT thumb  Assessment: Form body RIGHT thumb  Plan: Recommend surgical removal

## 2011-03-29 NOTE — Anesthesia Procedure Notes (Signed)
Regional Block:  Bier block (IV Regional)  Pre-Anesthetic Checklist: ,, timeout performed, Correct Patient, Correct Site, Correct Laterality, Correct Procedure, Correct Position,, surgical consent, pre-op evaluation,  Laterality: Right     Bier block (IV Regional) Narrative:  Start time: 03/29/2011 12:55 PM Resident/CRNA: Cletus Gash  CRNA

## 2011-03-29 NOTE — Anesthesia Postprocedure Evaluation (Signed)
  Anesthesia Post-op Note  Patient: [name] [name]  Procedure(s) Performed:  REMOVAL FOREIGN BODY EXTREMITY - Removal Foreign Body Right Thumb  Patient Location: PACU  Anesthesia Type: Bier block  Level of Consciousness: awake, alert  and oriented  Airway and Oxygen Therapy: Patient Spontanous Breathing and Patient connected to nasal cannula oxygen  Post-op Pain: Adding Fenyanyl to Digital block  Post-op Assessment: Post-op Vital signs reviewed, Patient's Cardiovascular Status Stable and Respiratory Function Stable  Post-op Vital Signs: Reviewed and stable  Complications: No apparent anesthesia complications

## 2011-03-29 NOTE — Transfer of Care (Signed)
Immediate Anesthesia Transfer of Care Note  Patient: [name] [name]  Procedure(s) Performed:  REMOVAL FOREIGN BODY EXTREMITY - Removal Foreign Body Right Thumb  Patient Location: PACU  Anesthesia Type: Bier block  Level of Consciousness: awake, alert  and oriented  Airway & Oxygen Therapy: Patient Spontanous Breathing and Patient connected to nasal cannula oxygen  Post-op Assessment: Report given to PACU RN, Post -op Vital signs reviewed and stable and Patient moving all extremities  Post vital signs: Reviewed  Complications: No apparent anesthesia complications

## 2011-03-29 NOTE — Anesthesia Preprocedure Evaluation (Addendum)
Anesthesia Evaluation  Name, MR# and DOB Patient awake  General Assessment Comment  Reviewed: Allergy & Precautions, H&P , NPO status , Patient's Chart, lab work & pertinent test results  History of Anesthesia Complications Negative for: history of anesthetic complications  Airway Mallampati: II TM Distance: >3 FB Neck ROM: Full    Dental  (+) Teeth Intact   Pulmonary  sleep apnea and Continuous Positive Airway Pressure VentilationCOPD COPD inhaler  clear to auscultation  breath sounds clear to auscultation none    Cardiovascular hypertension, Pt. on medications Regular Normal    Neuro/Psych    (+) Anxiety, Depression,    GI/Hepatic/Renal       PUD,  GERD Medicated and Controlled     Endo/Other  (+) Diabetes mellitus-, Type 2, Insulin Dependent,     Abdominal   Musculoskeletal  (+) Arthritis -, Osteoarthritis,    Hematology   Peds  Reproductive/Obstetrics    Anesthesia Other Findings             Anesthesia Physical Anesthesia Plan  ASA: III  Anesthesia Plan: Bier Block   Post-op Pain Management:    Induction:   Airway Management Planned: Nasal Cannula  Additional Equipment:   Intra-op Plan:   Post-operative Plan:   Informed Consent: I have reviewed the patients History and Physical, chart, labs and discussed the procedure including the risks, benefits and alternatives for the proposed anesthesia with the patient or authorized representative who has indicated his/her understanding and acceptance.     Plan Discussed with:   Anesthesia Plan Comments:         Anesthesia Quick Evaluation

## 2011-03-29 NOTE — Interval H&P Note (Signed)
History and Physical Interval Note:   03/29/2011   12:44 PM   [name] [name]  has presented today for surgery, with the diagnosis of foreign body right thumb  The various methods of treatment have been discussed with the patient and family. After consideration of risks, benefits and other options for treatment, the patient has consented to  Procedure(s):right thumb REMOVAL FOREIGN BODY EXTREMITY as a surgical intervention .  I have reviewed the patients' chart and labs.  Questions were answered to the patient's satisfaction.     Arther Abbott  MD

## 2011-04-03 ENCOUNTER — Encounter: Payer: Self-pay | Admitting: Orthopedic Surgery

## 2011-04-03 ENCOUNTER — Ambulatory Visit (INDEPENDENT_AMBULATORY_CARE_PROVIDER_SITE_OTHER): Payer: Medicare Other | Admitting: Orthopedic Surgery

## 2011-04-03 DIAGNOSIS — S60351A Superficial foreign body of right thumb, initial encounter: Secondary | ICD-10-CM

## 2011-04-03 DIAGNOSIS — S60459A Superficial foreign body of unspecified finger, initial encounter: Secondary | ICD-10-CM

## 2011-04-03 NOTE — Progress Notes (Signed)
Postop foreign body removal RIGHT thumb doing well  Incision is clean and dry no swelling no redness  Comment for suture removal in 2 weeks

## 2011-04-04 ENCOUNTER — Encounter (HOSPITAL_COMMUNITY): Payer: Self-pay | Admitting: Orthopedic Surgery

## 2011-04-11 LAB — DIFFERENTIAL
Basophils Absolute: 0
Basophils Relative: 1
Eosinophils Absolute: 0.1
Monocytes Absolute: 0.4
Neutro Abs: 1.8
Neutrophils Relative %: 47

## 2011-04-11 LAB — RAPID URINE DRUG SCREEN, HOSP PERFORMED
Barbiturates: NOT DETECTED
Cocaine: NOT DETECTED
Cocaine: POSITIVE — AB
Opiates: NOT DETECTED
Opiates: POSITIVE — AB
Tetrahydrocannabinol: NOT DETECTED
Tetrahydrocannabinol: NOT DETECTED

## 2011-04-11 LAB — CBC
HCT: 40.9
Hemoglobin: 13.6
RBC: 4.84
WBC: 3.9 — ABNORMAL LOW

## 2011-04-11 LAB — URINALYSIS, ROUTINE W REFLEX MICROSCOPIC
Glucose, UA: NEGATIVE
Ketones, ur: NEGATIVE
Leukocytes, UA: NEGATIVE
Nitrite: NEGATIVE
Specific Gravity, Urine: 1.025
pH: 5.5

## 2011-04-11 LAB — COMPREHENSIVE METABOLIC PANEL
ALT: 30
Alkaline Phosphatase: 49
BUN: 14
Chloride: 104
Glucose, Bld: 117 — ABNORMAL HIGH
Potassium: 4.3
Sodium: 141
Total Bilirubin: 0.5

## 2011-04-12 ENCOUNTER — Encounter: Payer: Self-pay | Admitting: Orthopedic Surgery

## 2011-04-12 ENCOUNTER — Ambulatory Visit (INDEPENDENT_AMBULATORY_CARE_PROVIDER_SITE_OTHER): Payer: Medicare Other | Admitting: Orthopedic Surgery

## 2011-04-12 VITALS — Ht 64.5 in | Wt 232.0 lb

## 2011-04-12 DIAGNOSIS — S60459A Superficial foreign body of unspecified finger, initial encounter: Secondary | ICD-10-CM

## 2011-04-12 DIAGNOSIS — S60351A Superficial foreign body of right thumb, initial encounter: Secondary | ICD-10-CM

## 2011-04-12 NOTE — Patient Instructions (Signed)
Apply antibiotic ointment twice a day

## 2011-04-12 NOTE — Progress Notes (Signed)
  F/u FB removed left thumb  For suture removal   I took the sutures out. The wound looks clean. Patient is advised to place antibiotic ointment twice a day and keep covered.  He will followup for his LEFT carpal tunnel syndrome. Previous nerve conduction study done in June and reviewed.

## 2011-05-03 LAB — I-STAT 8, (EC8 V) (CONVERTED LAB)
Acid-base deficit: 1
BUN: 23
Bicarbonate: 24.2 — ABNORMAL HIGH
Chloride: 101
Glucose, Bld: 622
HCT: 44
Hemoglobin: 15
Operator id: 132501
Potassium: 4.4
Sodium: 131 — ABNORMAL LOW
TCO2: 25
pCO2, Ven: 40.1 — ABNORMAL LOW
pH, Ven: 7.389 — ABNORMAL HIGH

## 2011-05-03 LAB — POCT I-STAT CREATININE
Creatinine, Ser: 1.5
Operator id: 132501

## 2011-05-08 ENCOUNTER — Other Ambulatory Visit: Payer: Self-pay | Admitting: Orthopedic Surgery

## 2011-05-08 DIAGNOSIS — M25569 Pain in unspecified knee: Secondary | ICD-10-CM

## 2011-05-08 MED ORDER — HYDROCODONE-ACETAMINOPHEN 5-325 MG PO TABS: 1.0000 | ORAL_TABLET | ORAL | Status: DC | PRN

## 2011-05-10 ENCOUNTER — Encounter: Payer: Self-pay | Admitting: Orthopedic Surgery

## 2011-05-10 ENCOUNTER — Ambulatory Visit (INDEPENDENT_AMBULATORY_CARE_PROVIDER_SITE_OTHER): Payer: Medicare Other | Admitting: Orthopedic Surgery

## 2011-05-10 VITALS — BP 124/80 | Ht 64.5 in | Wt 228.2 lb

## 2011-05-10 DIAGNOSIS — G5600 Carpal tunnel syndrome, unspecified upper limb: Secondary | ICD-10-CM

## 2011-05-10 NOTE — Patient Instructions (Signed)
Has appt in FEB to schedule CTR LEFT

## 2011-05-11 ENCOUNTER — Encounter: Payer: Self-pay | Admitting: Orthopedic Surgery

## 2011-05-11 NOTE — Progress Notes (Signed)
   Est. Patient new problem  Chief complaint carpal tunnel syndrome LEFT upper extremity  This patient is 60 years old he said carpal tunnel syndrome in the RIGHT wrist status post release now with symptoms in the LEFT upper extremity with throbbing pain numbness swelling most notable when he is driving.  His pain is 6/10 it seems to be intermittent.  He's had I nerve conduction study it shows moderate carpal tunnel syndrome on the RIGHT severe on the LEFT he also has diabetic polyneuropathy  He has been on vitamin B 6 and has worn a splint but is not ready for surgery according to him.  Review of systems fatigue, blurry vision, shortness of breath, wheezing, snoring with history of stent sleep apnea, joint pain, joint swelling, joint stiffness, itching related to his skin of course healing related to his skin. Past Medical History  Diagnosis Date  . COPD (chronic obstructive pulmonary disease)   . Sinusitis   . PUD (peptic ulcer disease)     remote, reports f/u EGD about 8 years ago unremarkable   . Diabetes   . GERD (gastroesophageal reflux disease)   . Hyperlipidemia   . Hypertension   . Sleep apnea   . Post traumatic stress disorder   . Anxiety   . Depression     Past Surgical History  Procedure Date  . Asad lt shoulder 12/2008    left shoulder  . Umbilical hernia repair [id number]    [place]  . Knee arthroscopy 10/2007    left  . Toenail excision     removed x2  . Eye surgery 12/22/2010    tear duct probing-[place]  . Foreign body removal 03/29/2011    Procedure: REMOVAL FOREIGN BODY EXTREMITY;  Surgeon: Arther Abbott, MD;  Location: [facility];  Service: Orthopedics;  Laterality: Right;  Removal Foreign Body Right Thumb    Family History  Problem Relation Age of Onset  . Hypertension Mother     MI  . Cancer Mother     breast   . Diabetes Mother   . Diabetes Father   . Hypertension Father   . Hypertension Sister   . Diabetes Sister   . Arthritis    . Asthma    . Lung  disease    . Anesthesia problems Neg Hx   . Hypotension Neg Hx   . Malignant hyperthermia Neg Hx   . Pseudochol deficiency Neg Hx    Physical Exam(12) GENERAL: normal development   CDV: pulses are normal   Skin: normal  Lymph: nodes were not palpable/normal  Psychiatric: awake, alert and oriented  Neuro: normal sensation  MSK His ambulation is normal  The RIGHT hand has a healed nontender volar incision consistent with carpal tunnel release  LEFT upper extremity is nontender to palpation, no swelling over the carpal tunnel.  Compression tests normal.  Range of motion normal.  Strength normal.  Wrist stable.  Phalen's test normal.  Assessment: Nerve conduction study reviewed shows carpal tunnel syndrome RIGHT and LEFT LEFT worse than RIGHT    Plan: The patient like to wait and delay surgical treatment at this time so he will continue in a splint and take vitamins B6

## 2011-07-10 ENCOUNTER — Encounter (HOSPITAL_COMMUNITY): Payer: Self-pay

## 2011-07-10 ENCOUNTER — Emergency Department (HOSPITAL_COMMUNITY)
Admission: EM | Admit: 2011-07-10 | Discharge: 2011-07-10 | Disposition: A | Discharge status: Home / Self Care | Payer: Medicare Other | Attending: Emergency Medicine | Admitting: Emergency Medicine

## 2011-07-10 ENCOUNTER — Emergency Department (HOSPITAL_COMMUNITY): Payer: Medicare Other

## 2011-07-10 DIAGNOSIS — J4489 Other specified chronic obstructive pulmonary disease: Secondary | ICD-10-CM | POA: Insufficient documentation

## 2011-07-10 DIAGNOSIS — M2390 Unspecified internal derangement of unspecified knee: Secondary | ICD-10-CM | POA: Insufficient documentation

## 2011-07-10 DIAGNOSIS — Z79899 Other long term (current) drug therapy: Secondary | ICD-10-CM | POA: Insufficient documentation

## 2011-07-10 DIAGNOSIS — S838X9A Sprain of other specified parts of unspecified knee, initial encounter: Secondary | ICD-10-CM | POA: Insufficient documentation

## 2011-07-10 DIAGNOSIS — I10 Essential (primary) hypertension: Secondary | ICD-10-CM | POA: Insufficient documentation

## 2011-07-10 DIAGNOSIS — W19XXXA Unspecified fall, initial encounter: Secondary | ICD-10-CM | POA: Insufficient documentation

## 2011-07-10 DIAGNOSIS — M2391 Unspecified internal derangement of right knee: Secondary | ICD-10-CM

## 2011-07-10 DIAGNOSIS — G4730 Sleep apnea, unspecified: Secondary | ICD-10-CM | POA: Insufficient documentation

## 2011-07-10 DIAGNOSIS — E785 Hyperlipidemia, unspecified: Secondary | ICD-10-CM | POA: Insufficient documentation

## 2011-07-10 DIAGNOSIS — S76119A Strain of unspecified quadriceps muscle, fascia and tendon, initial encounter: Secondary | ICD-10-CM

## 2011-07-10 DIAGNOSIS — E119 Type 2 diabetes mellitus without complications: Secondary | ICD-10-CM | POA: Insufficient documentation

## 2011-07-10 DIAGNOSIS — M25469 Effusion, unspecified knee: Secondary | ICD-10-CM | POA: Insufficient documentation

## 2011-07-10 DIAGNOSIS — F341 Dysthymic disorder: Secondary | ICD-10-CM | POA: Insufficient documentation

## 2011-07-10 DIAGNOSIS — J449 Chronic obstructive pulmonary disease, unspecified: Secondary | ICD-10-CM | POA: Insufficient documentation

## 2011-07-10 MED ORDER — IBUPROFEN 800 MG PO TABS
800.0000 mg | ORAL_TABLET | Freq: Once | ORAL | Status: AC
  Administered 2011-07-10: 800 mg via ORAL
  Filled 2011-07-10: qty 1

## 2011-07-10 MED ORDER — OXYCODONE-ACETAMINOPHEN 5-325 MG PO TABS
2.0000 | ORAL_TABLET | Freq: Once | ORAL | Status: AC
  Administered 2011-07-10: 2 via ORAL
  Filled 2011-07-10: qty 2

## 2011-07-10 MED ORDER — OXYCODONE-ACETAMINOPHEN 5-325 MG PO TABS: 2.0000 | ORAL_TABLET | ORAL | Status: AC | PRN

## 2011-07-10 NOTE — ED Notes (Signed)
Was cleaning Room 11 in ED, heard this thud sound coming from hallway, went to see what was going on, patient in Room 12 was kneeling on the floor and family member was trying to assist him up out of the floor.  He stated that he fell on the knee that he came in for. And call light was on the bed right beside him. I assisted him back to his wheelchair. I told him to use call bell if he needed assistance. Reported it RN Ruthy Dick and then reported to his nurse RN Ileene Hutchinson.

## 2011-07-10 NOTE — ED Provider Notes (Signed)
This chart was scribed for Janice Norrie, MD by Zella Ball. The patient was seen in room [facility] and the patient's care was started at 7:45 PM.   CSN: [id number]  Arrival date & time 07/10/11  [id number]   First MD Initiated Contact with Patient 07/10/11 1925      Chief Complaint  Patient presents with  . Knee Pain  . Fall    (Consider location/radiation/quality/duration/timing/severity/associated sxs/prior treatment) HPI Pt seen at 7:45PM [name] [name] is a 60 y.o. male who presents to the Emergency Department complaining of acute onset right knee pain. Pt states that he was outside 2 hours ago carry a bucket of water when he turned and fell on the affected knee. He thinks maybe he fell with the knee flexed underneath him. Patient's nondrinker about his mechanism injury. He denies any other injury such as seeing his head or loss of consciousness. He has constant pain, nothing makes it feel better . PCP Dr. Legrand Rams Orthopedist Dr. Aline Brochure   Past Medical History  Diagnosis Date  . COPD (chronic obstructive pulmonary disease)   . Sinusitis   . PUD (peptic ulcer disease)     remote, reports f/u EGD about 8 years ago unremarkable   . Diabetes   . GERD (gastroesophageal reflux disease)   . Hyperlipidemia   . Hypertension   . Sleep apnea   . Post traumatic stress disorder   . Anxiety   . Depression     Past Surgical History  Procedure Date  . Asad lt shoulder 12/2008    left shoulder  . Umbilical hernia repair [id number]    [place]  . Knee arthroscopy 10/2007    left  . Toenail excision     removed x2  . Eye surgery 12/22/2010    tear duct probing-[place]  . Foreign body removal 03/29/2011    Procedure: REMOVAL FOREIGN BODY EXTREMITY;  Surgeon: Arther Abbott, MD;  Location: [facility];  Service: Orthopedics;  Laterality: Right;  Removal Foreign Body Right Thumb    Family History  Problem Relation Age of Onset  . Hypertension Mother     MI  . Cancer Mother     breast   .  Diabetes Mother   . Diabetes Father   . Hypertension Father   . Hypertension Sister   . Diabetes Sister   . Arthritis    . Asthma    . Lung disease    . Anesthesia problems Neg Hx   . Hypotension Neg Hx   . Malignant hyperthermia Neg Hx   . Pseudochol deficiency Neg Hx     History  Substance Use Topics  . Smoking status: Former Smoker -- 1.0 packs/day for 25 years    Types: Cigarettes    Quit date: 03/27/2010  . Smokeless tobacco: Not on file  . Alcohol Use: No     stopped feb 2011   Disability for reactive airway disease   Review of Systems 10 Systems reviewed and are negative for acute change except as noted in the HPI.  Allergies  Tramadol  Home Medications   Current Outpatient Rx  Name Route Sig Dispense Refill  . ALBUTEROL SULFATE HFA 108 (90 BASE) MCG/ACT IN AERS Inhalation Inhale 2 puffs into the lungs 2 (two) times daily.      Marland Kitchen AMLODIPINE BESYLATE 10 MG PO TABS Oral Take 10 mg by mouth daily.      . BUDESONIDE-FORMOTEROL FUMARATE 160-4.5 MCG/ACT IN AERO Inhalation Inhale 2 puffs into the lungs 2 (  two) times daily.      Marland Kitchen EXENATIDE 10 MCG/0.04ML [place] SOLN Subcutaneous Inject into the skin 2 (two) times daily with a meal.      . GABAPENTIN 100 MG PO CAPS Oral Take 1 capsule (100 mg total) by mouth 3 (three) times daily. 30 capsule 2  . HYDROCODONE-ACETAMINOPHEN 5-325 MG PO TABS Oral Take 1 tablet by mouth every 4 (four) hours as needed for pain. 60 tablet 5  . INSULIN GLARGINE 100 UNIT/ML [place] SOLN Subcutaneous Inject 20 Units into the skin at bedtime.     . INSULIN LISPRO (HUMAN) 100 UNIT/ML [place] SOLN Subcutaneous Inject into the skin 3 (three) times daily before meals.      . MOMETASONE FUROATE 50 MCG/ACT NA SUSP Nasal 2 sprays by Nasal route daily.      . CENTRUM SILVER ULTRA MENS PO TABS Oral Take by mouth.      . OMEPRAZOLE 20 MG PO CPDR Oral Take 20 mg by mouth daily.      Marland Kitchen SIMVASTATIN 20 MG PO TABS Oral Take 20 mg by mouth at bedtime.        BP 153/82   Pulse 77  Temp(Src) 99.1 F (37.3 C) (Oral)  Resp 20  Ht 5\' 4"  (1.626 m)  Wt 233 lb (105.688 kg)  BMI 39.99 kg/m2  SpO2 96% Vital signs show mild hypertension otherwise normal  Physical Exam  Nursing note and vitals reviewed. Constitutional: He is oriented to person, place, and time. He appears well-developed and well-nourished.  HENT:  Head: Normocephalic and atraumatic.  Eyes: Conjunctivae and EOM are normal. Pupils are equal, round, and reactive to light.  Neck: Normal range of motion. Neck supple.  Pulmonary/Chest: Effort normal and breath sounds normal.  Musculoskeletal: He exhibits edema.       Moderate effusion and diffuse swelling of the right knee  Neurological: He is alert and oriented to person, place, and time.  Skin: Skin is warm and dry.  Psychiatric: He has a normal mood and affect. His behavior is normal.    ED Course  Procedures (including critical care time)  Given ice pack to put on his knee. He elected get oral pain medicine, he was given Percocet 2 by mouth  Recheck at  9:37 PM No broken bones on x-ray. Knee immobilizer to reduce movement. Possible tendon/quadricep muscle damage in affected knee. Need MRI to determine extent of knee injury. Unable to straight leg raise.  Pt unable to SLR on the right. He is also unable to maintain a SLR if I lift up his leg. I have explained that he probably has an injury to his quadriceps tendon/muscle and will need MRI and orthopedic reevaluation (has had surgery by Dr Aline Brochure in the past).    Dg Knee Complete 4 Views Right  07/10/2011  *RADIOLOGY REPORT*  Clinical Data: Fall.  Knee pain.  Joint effusion.  RIGHT KNEE - COMPLETE 4+ VIEW  Comparison: None.  Findings: The alignment of the knee is anatomic.  No fracture is identified.  There is marked soft tissue stranding anterior to the quadriceps tendon and in the suprapatellar pouch.  There is a knee effusion.   Mild atherosclerotic calcification.  Medial and lateral joint  spaces are preserved.  There is no laxity of the patellar tendon (patella baja) to suggest a complete quadriceps tendon tear.  IMPRESSION: Knee effusion and inflammatory changes in the anterior distal thigh. Consider follow-up MRI if concern for quadriceps tendon injury.  No acute osseous abnormality.  Original Report Authenticated By: Dereck Ligas, M.D.    Diagnoses that have been ruled out:  Diagnoses that are still under consideration:  Final diagnoses:  Internal derangement of right knee  Quadriceps muscle strain  Fall   New Prescriptions   OXYCODONE-ACETAMINOPHEN (PERCOCET) 5-325 MG PER TABLET    Take 2 tablets by mouth every 4 (four) hours as needed for pain.    Plan outpatient MRI on 12/26, referral to Dr Aline Brochure    MDM     I personally performed the services described in this documentation, which was scribed in my presence. The recorded information has been reviewed and considered. Rolland Porter, MD, Abram Sander      Janice Norrie, MD 07/10/11 2229

## 2011-07-10 NOTE — ED Notes (Signed)
Patient asked to speak with me,upon going in room patient told me that he had percocet and that he was nodding off and and was reaching and fell out of the wheelchair. Made the statement to me when he get his reward that he would look for me.

## 2011-07-10 NOTE — ED Notes (Signed)
Pt presents with right knee pain after falling to the ground this evening. Pt denies pain to anywhere else. Pt denies hitting head or LOC.

## 2011-07-10 NOTE — ED Notes (Addendum)
Pt fall was reported to Dr. Tomi Bamberger. (Charted by C. Corey Harold, Therapist, sports)

## 2011-07-12 ENCOUNTER — Other Ambulatory Visit (HOSPITAL_COMMUNITY): Payer: Self-pay | Admitting: Emergency Medicine

## 2011-07-12 ENCOUNTER — Ambulatory Visit (HOSPITAL_COMMUNITY)
Admit: 2011-07-12 | Discharge: 2011-07-12 | Disposition: A | Discharge status: Home / Self Care | Payer: Medicare Other | Source: Ambulatory Visit | Attending: Emergency Medicine | Admitting: Emergency Medicine

## 2011-07-12 DIAGNOSIS — S99919A Unspecified injury of unspecified ankle, initial encounter: Secondary | ICD-10-CM | POA: Insufficient documentation

## 2011-07-12 DIAGNOSIS — X500XXA Overexertion from strenuous movement or load, initial encounter: Secondary | ICD-10-CM | POA: Insufficient documentation

## 2011-07-12 DIAGNOSIS — S76119A Strain of unspecified quadriceps muscle, fascia and tendon, initial encounter: Secondary | ICD-10-CM

## 2011-07-12 DIAGNOSIS — M25569 Pain in unspecified knee: Secondary | ICD-10-CM | POA: Insufficient documentation

## 2011-07-12 DIAGNOSIS — S8990XA Unspecified injury of unspecified lower leg, initial encounter: Secondary | ICD-10-CM | POA: Insufficient documentation

## 2011-07-17 ENCOUNTER — Telehealth: Payer: Self-pay | Admitting: Orthopedic Surgery

## 2011-07-17 NOTE — Telephone Encounter (Signed)
Patient came by the office today, 07/17/11, states had been seen at [facility] Emergency room 07/10/11 for right knee injury.  He then had an MRI at [facility] on 07/12/11.  States he was told may need surgery.  He has brought in the film and has been scheduled for a follow up appointment here 07/20/11.   He is asking if any pain medication can be phoned in due to pain; he states he was given only 30 pain pills at the time of ER visit.  His pharmacy is [facility].  Please advise.  Patient contact ph#'s are: Home # [phone] [facility]) / Cell # [phone].

## 2011-07-19 NOTE — Telephone Encounter (Signed)
07/17/11 - Dr. Aline Brochure reviewed; received back 07/19/11, and patient's appointment is scheduled for tomorrow, 07/20/11.  Patient aware.

## 2011-07-20 ENCOUNTER — Telehealth: Payer: Self-pay | Admitting: Orthopedic Surgery

## 2011-07-20 ENCOUNTER — Other Ambulatory Visit: Payer: Self-pay

## 2011-07-20 ENCOUNTER — Encounter (HOSPITAL_COMMUNITY)
Admission: RE | Admit: 2011-07-20 | Discharge: 2011-07-20 | Disposition: A | Discharge status: Home / Self Care | Payer: Medicare Other | Source: Ambulatory Visit | Attending: Orthopedic Surgery | Admitting: Orthopedic Surgery

## 2011-07-20 ENCOUNTER — Encounter: Payer: Self-pay | Admitting: Orthopedic Surgery

## 2011-07-20 ENCOUNTER — Ambulatory Visit (INDEPENDENT_AMBULATORY_CARE_PROVIDER_SITE_OTHER): Payer: Medicare Other | Admitting: Orthopedic Surgery

## 2011-07-20 ENCOUNTER — Ambulatory Visit: Payer: Medicare Other | Admitting: Orthopedic Surgery

## 2011-07-20 ENCOUNTER — Encounter (HOSPITAL_COMMUNITY): Payer: Self-pay

## 2011-07-20 VITALS — BP 150/90 | Ht 64.0 in | Wt 233.0 lb

## 2011-07-20 DIAGNOSIS — S838X9A Sprain of other specified parts of unspecified knee, initial encounter: Secondary | ICD-10-CM

## 2011-07-20 DIAGNOSIS — S76119A Strain of unspecified quadriceps muscle, fascia and tendon, initial encounter: Secondary | ICD-10-CM | POA: Insufficient documentation

## 2011-07-20 DIAGNOSIS — S86819A Strain of other muscle(s) and tendon(s) at lower leg level, unspecified leg, initial encounter: Secondary | ICD-10-CM

## 2011-07-20 HISTORY — DX: Bell's palsy: G51.0

## 2011-07-20 LAB — BASIC METABOLIC PANEL
Calcium: 9.6 mg/dL (ref 8.4–10.5)
GFR calc non Af Amer: 46 mL/min — ABNORMAL LOW (ref >90)
Sodium: 140 mEq/L (ref 135–145)

## 2011-07-20 LAB — SURGICAL PCR SCREEN
MRSA, PCR: NEGATIVE
Staphylococcus aureus: NEGATIVE

## 2011-07-20 LAB — HEMOGLOBIN AND HEMATOCRIT, BLOOD
HCT: 35 % — ABNORMAL LOW (ref 39.0–52.0)
Hemoglobin: 11.4 g/dL — ABNORMAL LOW (ref 13.0–17.0)

## 2011-07-20 NOTE — Telephone Encounter (Signed)
See following note re: pre-authorization, obtained per [facility] at [facility], ph [phone] for surgery scheduled 07/21/11 at [facility], [place].  Pre-AuthKU:[id number].

## 2011-07-20 NOTE — Patient Instructions (Addendum)
[place]  07/20/2011   Your procedure is scheduled on:  07/21/2011  Report to [facility] at  44  AM.  Call this number if you have problems the morning of surgery: [phone]   Remember:   Do not eat food:After Midnight.  May have clear liquids:until Midnight .  Clear liquids include soda, tea, black coffee, apple or grape juice, broth.  Take these medicines the morning of surgery with A SIP OF WATER: norvasc,neurontin,prilsoec,oycodone. Take 1/2 Lantus dosage tonight. Take albuterol,symbicort and nasonex before you come.   Do not wear jewelry, make-up or nail polish.  Do not wear lotions, powders, or perfumes. You may wear deodorant.  Do not shave 48 hours prior to surgery.  Do not bring valuables to the hospital.  Contacts, dentures or bridgework may not be worn into surgery.  Leave suitcase in the car. After surgery it may be brought to your room.  For patients admitted to the hospital, checkout time is 11:00 AM the day of discharge.   Patients discharged the day of surgery will not be allowed to drive home.  Name and phone number of your driver: family  Special Instructions: CHG Shower Use Special Wash: 1/2 bottle night before surgery and 1/2 bottle morning of surgery.   Please read over the following fact sheets that you were given: Pain Booklet, MRSA Information, Surgical Site Infection Prevention, Anesthesia Post-op Instructions and Care and Recovery After Surgery PATIENT INSTRUCTIONS POST-ANESTHESIA  IMMEDIATELY FOLLOWING SURGERY:  Do not drive or operate machinery for the first twenty four hours after surgery.  Do not make any important decisions for twenty four hours after surgery or while taking narcotic pain medications or sedatives.  If you develop intractable nausea and vomiting or a severe headache please notify your doctor immediately.  FOLLOW-UP:  Please make an appointment with your surgeon as instructed. You do not need to follow up with anesthesia unless  specifically instructed to do so.  WOUND CARE INSTRUCTIONS (if applicable):  Keep a dry clean dressing on the anesthesia/puncture wound site if there is drainage.  Once the wound has quit draining you may leave it open to air.  Generally you should leave the bandage intact for twenty four hours unless there is drainage.  If the epidural site drains for more than 36-48 hours please call the anesthesia department.  QUESTIONS?:  Please feel free to call your physician or the hospital operator if you have any questions, and they will be happy to assist you.     [place] [phone]

## 2011-07-20 NOTE — Progress Notes (Signed)
Patient ID: [name] [name], male   DOB: [birth year], 61 y.o.   MRN: [id number] Chief complaint pain RIGHT knee  Injury on December 24  MRI shows quadriceps tendon rupture  Patient preop for surgery  Informed consent done in the office  Patient Told to expect a 6 month recovery period

## 2011-07-20 NOTE — Telephone Encounter (Signed)
Contacted insurer, [employer], [place] [phone], per office visit today, 07/20/11,   regarding in-patient surgery, scheduled for 07/21/11 at [facility].  CPT code [phone], ICD9 code 844.8.  Spoke with Kriste Basque, who then referred me to nurse reviewer, Wilburn Mylar  Nurse reviewer consulted with medical director and gave pre-authorization as [id number].

## 2011-07-20 NOTE — Patient Instructions (Signed)
Go To the hospital for the preoperative visit

## 2011-07-20 NOTE — H&P (Signed)
[name] [name] is an 61 y.o. male.   Chief Complaint: RIGHT knee pain HPI: A 61 year old male was injured on December 24 he slipped while getting a pale water.   He complains of intense pain at the superior pole of the patella and inability to perform a straight leg raise is having difficulty walking.  He has sharp stabbing burning 7/10 constant pain relieved by Percocet  He has some numbness and tingling in his leg with some locking and swelling of the knee  Review of systems he reports a history of fatigue redness ordering of the eyes blurred vision he has a facial nerve palsy from Bell's palsy.  He has had some shortness of breath and wheezing with cough and tightness of his chest with snoring, heartburn nausea diarrhea.  Numbness and tingling in the leg nervousness and depression excessive thirst and urination is also reported    Past Medical History  Diagnosis Date  . COPD (chronic obstructive pulmonary disease)   . Sinusitis   . PUD (peptic ulcer disease)     remote, reports f/u EGD about 8 years ago unremarkable   . Diabetes   . GERD (gastroesophageal reflux disease)   . Hyperlipidemia   . Hypertension   . Sleep apnea   . Post traumatic stress disorder   . Anxiety   . Depression     Past Surgical History  Procedure Date  . Asad lt shoulder 12/2008    left shoulder  . Umbilical hernia repair [id number]    [place]  . Knee arthroscopy 10/2007    left  . Toenail excision     removed x2  . Eye surgery 12/22/2010    tear duct probing-[place]  . Foreign body removal 03/29/2011    Procedure: REMOVAL FOREIGN BODY EXTREMITY;  Surgeon: Arther Abbott, MD;  Location: [facility];  Service: Orthopedics;  Laterality: Right;  Removal Foreign Body Right Thumb    Family History  Problem Relation Age of Onset  . Hypertension Mother     MI  . Cancer Mother     breast   . Diabetes Mother   . Diabetes Father   . Hypertension Father   . Hypertension Sister   . Diabetes Sister   . Arthritis     . Asthma    . Lung disease    . Anesthesia problems Neg Hx   . Hypotension Neg Hx   . Malignant hyperthermia Neg Hx   . Pseudochol deficiency Neg Hx    Social History:  reports that he quit smoking about 15 months ago. His smoking use included Cigarettes. He has a 25 pack-year smoking history. He does not have any smokeless tobacco history on file. He reports that he uses illicit drugs (Cocaine). He reports that he does not drink alcohol.  Allergies:  Allergies  Allergen Reactions  . Tramadol Itching    Medications Prior to Admission  Medication Sig Dispense Refill  . albuterol (PROVENTIL HFA) 108 (90 BASE) MCG/ACT inhaler Inhale 2 puffs into the lungs 2 (two) times daily.        Marland Kitchen amLODipine (NORVASC) 10 MG tablet Take 10 mg by mouth daily.        . budesonide-formoterol (SYMBICORT) 160-4.5 MCG/ACT inhaler Inhale 2 puffs into the lungs 2 (two) times daily.        . Cyanocobalamin (B-12 PO) Take 1 tablet by mouth daily.        Marland Kitchen exenatide (BYETTA) 10 MCG/0.04ML SOLN Inject into the skin 2 (two) times  daily with a meal.        . fish oil-omega-3 fatty acids 1000 MG capsule Take 1 g by mouth daily.        Marland Kitchen gabapentin (NEURONTIN) 600 MG tablet Take 600 mg by mouth 3 (three) times daily.        . insulin glargine (LANTUS) 100 UNIT/ML injection Inject 70 Units into the skin at bedtime.       . insulin lispro (HUMALOG) 100 UNIT/ML injection Inject into the skin 3 (three) times daily before meals. Per sliding scale instructions      . mometasone (NASONEX) 50 MCG/ACT nasal spray 2 sprays by Nasal route daily.        . Multiple Vitamins-Minerals (CENTRUM SILVER ULTRA MENS) TABS Take 1 tablet by mouth daily.       Marland Kitchen omeprazole (PRILOSEC) 20 MG capsule Take 20 mg by mouth daily.        Marland Kitchen oxyCODONE-acetaminophen (PERCOCET) 5-325 MG per tablet Take 2 tablets by mouth every 4 (four) hours as needed for pain.  30 tablet  0  . simvastatin (ZOCOR) 20 MG tablet Take 20 mg by mouth at bedtime.          Marland Kitchen HYDROcodone-acetaminophen (NORCO) 5-325 MG per tablet Take 1 tablet by mouth every 4 (four) hours as needed for pain.  60 tablet  5   No current facility-administered medications on file as of 07/20/2011.    No results found for this or any previous visit (from the past 48 hour(s)). No results found.  Review of Systems  Musculoskeletal: Positive for joint pain and falls.  All other systems reviewed and are negative.    Blood pressure 150/90, height 5\' 4"  (1.626 m), weight 233 lb (105.688 kg). Physical Exam  Nursing note and vitals reviewed. Constitutional: He is oriented to person, place, and time. He appears well-developed and well-nourished. No distress.  HENT:  Head: Normocephalic and atraumatic.  Neck: Normal range of motion.  Cardiovascular: Normal rate.   Respiratory: Effort normal.  GI: Soft.  Musculoskeletal:       Right shoulder: Normal.       Left shoulder: He exhibits normal range of motion and no tenderness.       Right elbow: Normal.      Left elbow: Normal.       Right wrist: Normal.       Left wrist: Normal.       Right hip: Normal.       Left hip: Normal.       Right knee: He exhibits decreased range of motion, swelling, effusion, ecchymosis and abnormal patellar mobility. He exhibits normal alignment and no LCL laxity. tenderness found. Patellar tendon tenderness noted.       Left knee: Normal.       Left upper leg: He exhibits tenderness and swelling.  Neurological: He is alert and oriented to person, place, and time. He has normal reflexes. A cranial nerve deficit is present.       CN 7 facial nerve is resolving  Skin: Skin is warm and dry.  Psychiatric: He has a normal mood and affect. His behavior is normal. Judgment and thought content normal.     Assessment/Plan Plain film and MRI were reviewed  His quadriceps tendon rupture RIGHT lower extremity  Recommend repair  There is no real indication for non-operative treatment for this injury. The  patient should expect a 95% chance of returning to normal function with residual mild loss of some  knee flexion and some weakness in the RIGHT leg.  Arther Abbott 07/20/2011, 2:34 PM

## 2011-07-21 ENCOUNTER — Inpatient Hospital Stay (HOSPITAL_COMMUNITY)
Admission: RE | Admit: 2011-07-21 | Discharge: 2011-07-25 | DRG: 502 | Disposition: A | Discharge status: Home / Self Care | Payer: Medicare Other | Source: Ambulatory Visit | Attending: Orthopedic Surgery | Admitting: Orthopedic Surgery

## 2011-07-21 ENCOUNTER — Encounter (HOSPITAL_COMMUNITY): Payer: Self-pay | Admitting: Anesthesiology

## 2011-07-21 ENCOUNTER — Encounter (HOSPITAL_COMMUNITY): Payer: Self-pay | Admitting: *Deleted

## 2011-07-21 ENCOUNTER — Encounter (HOSPITAL_COMMUNITY)
Admission: RE | Disposition: A | Discharge status: Home / Self Care | Payer: Self-pay | Source: Ambulatory Visit | Attending: Orthopedic Surgery

## 2011-07-21 ENCOUNTER — Inpatient Hospital Stay (HOSPITAL_COMMUNITY): Payer: Medicare Other | Admitting: Anesthesiology

## 2011-07-21 ENCOUNTER — Encounter (HOSPITAL_COMMUNITY): Payer: Self-pay

## 2011-07-21 DIAGNOSIS — E119 Type 2 diabetes mellitus without complications: Secondary | ICD-10-CM | POA: Diagnosis present

## 2011-07-21 DIAGNOSIS — F341 Dysthymic disorder: Secondary | ICD-10-CM | POA: Diagnosis present

## 2011-07-21 DIAGNOSIS — S76119A Strain of unspecified quadriceps muscle, fascia and tendon, initial encounter: Secondary | ICD-10-CM

## 2011-07-21 DIAGNOSIS — K219 Gastro-esophageal reflux disease without esophagitis: Secondary | ICD-10-CM | POA: Diagnosis present

## 2011-07-21 DIAGNOSIS — G8929 Other chronic pain: Secondary | ICD-10-CM | POA: Diagnosis present

## 2011-07-21 DIAGNOSIS — W010XXA Fall on same level from slipping, tripping and stumbling without subsequent striking against object, initial encounter: Secondary | ICD-10-CM | POA: Diagnosis present

## 2011-07-21 DIAGNOSIS — S86819A Strain of other muscle(s) and tendon(s) at lower leg level, unspecified leg, initial encounter: Principal | ICD-10-CM | POA: Diagnosis present

## 2011-07-21 DIAGNOSIS — S838X9A Sprain of other specified parts of unspecified knee, initial encounter: Principal | ICD-10-CM | POA: Diagnosis present

## 2011-07-21 DIAGNOSIS — J4489 Other specified chronic obstructive pulmonary disease: Secondary | ICD-10-CM | POA: Diagnosis present

## 2011-07-21 DIAGNOSIS — IMO0002 Reserved for concepts with insufficient information to code with codable children: Secondary | ICD-10-CM

## 2011-07-21 DIAGNOSIS — M549 Dorsalgia, unspecified: Secondary | ICD-10-CM | POA: Diagnosis present

## 2011-07-21 DIAGNOSIS — I10 Essential (primary) hypertension: Secondary | ICD-10-CM | POA: Diagnosis present

## 2011-07-21 DIAGNOSIS — E785 Hyperlipidemia, unspecified: Secondary | ICD-10-CM | POA: Diagnosis present

## 2011-07-21 DIAGNOSIS — G4730 Sleep apnea, unspecified: Secondary | ICD-10-CM | POA: Diagnosis present

## 2011-07-21 DIAGNOSIS — J449 Chronic obstructive pulmonary disease, unspecified: Secondary | ICD-10-CM | POA: Diagnosis present

## 2011-07-21 HISTORY — PX: QUADRICEPS TENDON REPAIR: SHX756

## 2011-07-21 LAB — GLUCOSE, CAPILLARY
Glucose-Capillary: 192 mg/dL — ABNORMAL HIGH (ref 70–99)
Glucose-Capillary: 138 mg/dL — ABNORMAL HIGH (ref 70–99)
Glucose-Capillary: 219 mg/dL — ABNORMAL HIGH (ref 70–99)

## 2011-07-21 SURGERY — REPAIR, TENDON, QUADRICEPS
Anesthesia: Spinal | Site: Knee | Laterality: Right | Wound class: Clean

## 2011-07-21 MED ORDER — PROPOFOL 10 MG/ML IV EMUL
INTRAVENOUS | Status: DC | PRN
  Administered 2011-07-21: 40 ug/kg/min via INTRAVENOUS

## 2011-07-21 MED ORDER — BUPIVACAINE-EPINEPHRINE PF 0.5-1:200000 % IJ SOLN
INTRAMUSCULAR | Status: AC
  Filled 2011-07-21: qty 10

## 2011-07-21 MED ORDER — LIDOCAINE HCL (PF) 1 % IJ SOLN
INTRAMUSCULAR | Status: AC
  Filled 2011-07-21: qty 5

## 2011-07-21 MED ORDER — MIDAZOLAM HCL 2 MG/2ML IJ SOLN
INTRAMUSCULAR | Status: AC
  Administered 2011-07-21: 2 mg via INTRAVENOUS
  Filled 2011-07-21: qty 2

## 2011-07-21 MED ORDER — GLYCOPYRROLATE 0.2 MG/ML IJ SOLN
INTRAMUSCULAR | Status: AC
  Administered 2011-07-21: 0.2 mg via INTRAVENOUS
  Filled 2011-07-21: qty 1

## 2011-07-21 MED ORDER — CEFAZOLIN SODIUM-DEXTROSE 2-3 GM-% IV SOLR
INTRAVENOUS | Status: AC
  Filled 2011-07-21: qty 50

## 2011-07-21 MED ORDER — ONDANSETRON HCL 4 MG/2ML IJ SOLN
INTRAMUSCULAR | Status: AC
  Administered 2011-07-21: 4 mg via INTRAVENOUS
  Filled 2011-07-21: qty 2

## 2011-07-21 MED ORDER — BUPIVACAINE IN DEXTROSE 0.75-8.25 % IT SOLN
INTRATHECAL | Status: DC | PRN
  Administered 2011-07-21: 12.5 mg via INTRATHECAL

## 2011-07-21 MED ORDER — LACTATED RINGERS IV SOLN
INTRAVENOUS | Status: DC | PRN
  Administered 2011-07-21 (×2): via INTRAVENOUS

## 2011-07-21 MED ORDER — MIDAZOLAM HCL 2 MG/2ML IJ SOLN
1.0000 mg | INTRAMUSCULAR | Status: DC | PRN
  Administered 2011-07-21 (×2): 2 mg via INTRAVENOUS

## 2011-07-21 MED ORDER — GLYCOPYRROLATE 0.2 MG/ML IJ SOLN
0.2000 mg | Freq: Once | INTRAMUSCULAR | Status: AC | PRN
  Administered 2011-07-21: 0.2 mg via INTRAVENOUS

## 2011-07-21 MED ORDER — ACETAMINOPHEN 325 MG PO TABS
325.0000 mg | ORAL_TABLET | ORAL | Status: DC | PRN
  Administered 2011-07-21: 325 mg via ORAL

## 2011-07-21 MED ORDER — DOCUSATE SODIUM 100 MG PO CAPS
100.0000 mg | ORAL_CAPSULE | Freq: Two times a day (BID) | ORAL | Status: DC
  Administered 2011-07-21 – 2011-07-25 (×8): 100 mg via ORAL
  Filled 2011-07-21 (×8): qty 1

## 2011-07-21 MED ORDER — METHOCARBAMOL 100 MG/ML IJ SOLN: 500.0000 mg | Freq: Four times a day (QID) | INTRAVENOUS | Status: DC | PRN

## 2011-07-21 MED ORDER — ADULT MULTIVITAMIN W/MINERALS CH
1.0000 | ORAL_TABLET | Freq: Every day | ORAL | Status: DC
  Administered 2011-07-21 – 2011-07-25 (×5): 1 via ORAL
  Filled 2011-07-21 (×5): qty 1

## 2011-07-21 MED ORDER — OXYCODONE-ACETAMINOPHEN 5-325 MG PO TABS
2.0000 | ORAL_TABLET | ORAL | Status: DC | PRN
  Administered 2011-07-22: 2 via ORAL
  Filled 2011-07-21: qty 2

## 2011-07-21 MED ORDER — EXENATIDE 5 MCG/0.02ML ~~LOC~~ SOPN
5.0000 ug | PEN_INJECTOR | Freq: Two times a day (BID) | SUBCUTANEOUS | Status: DC
  Administered 2011-07-21 – 2011-07-25 (×8): 5 ug via SUBCUTANEOUS
  Filled 2011-07-21 (×10): qty 0.02

## 2011-07-21 MED ORDER — SODIUM CHLORIDE 0.9 % IV SOLN
INTRAVENOUS | Status: DC
  Administered 2011-07-21: 100 mL/h via INTRAVENOUS
  Administered 2011-07-22: 950 mL via INTRAVENOUS
  Administered 2011-07-22: 04:00:00 via INTRAVENOUS
  Administered 2011-07-23: 950 mL via INTRAVENOUS
  Administered 2011-07-23: 01:00:00 via INTRAVENOUS

## 2011-07-21 MED ORDER — DIPHENHYDRAMINE HCL 12.5 MG/5ML PO ELIX: 12.5000 mg | ORAL_SOLUTION | ORAL | Status: DC | PRN

## 2011-07-21 MED ORDER — SENNOSIDES-DOCUSATE SODIUM 8.6-50 MG PO TABS: 1.0000 | ORAL_TABLET | Freq: Every evening | ORAL | Status: DC | PRN

## 2011-07-21 MED ORDER — INSULIN GLARGINE 100 UNIT/ML ~~LOC~~ SOLN
70.0000 [IU] | Freq: Every day | SUBCUTANEOUS | Status: DC
  Administered 2011-07-21 – 2011-07-24 (×4): 70 [IU] via SUBCUTANEOUS
  Filled 2011-07-21: qty 3

## 2011-07-21 MED ORDER — OXYCODONE-ACETAMINOPHEN 5-325 MG PO TABS
ORAL_TABLET | ORAL | Status: AC
  Filled 2011-07-21: qty 1

## 2011-07-21 MED ORDER — ACETAMINOPHEN 325 MG PO TABS
ORAL_TABLET | ORAL | Status: AC
  Filled 2011-07-21: qty 1

## 2011-07-21 MED ORDER — BUPIVACAINE-EPINEPHRINE 0.5% -1:200000 IJ SOLN
INTRAMUSCULAR | Status: DC | PRN
  Administered 2011-07-21: 30 mL

## 2011-07-21 MED ORDER — SODIUM CHLORIDE 0.9 % IR SOLN
Status: DC | PRN
  Administered 2011-07-21: 1000 mL

## 2011-07-21 MED ORDER — MAGNESIUM CITRATE PO SOLN: 1.0000 | Freq: Once | ORAL | Status: AC | PRN

## 2011-07-21 MED ORDER — HYDROMORPHONE HCL PF 1 MG/ML IJ SOLN
0.5000 mg | INTRAMUSCULAR | Status: DC | PRN
  Administered 2011-07-21: 1 mg via INTRAVENOUS
  Administered 2011-07-21: 0.5 mg via INTRAVENOUS
  Administered 2011-07-21: 1 mg via INTRAVENOUS
  Administered 2011-07-21: 0.5 mg via INTRAVENOUS
  Administered 2011-07-22 (×4): 1 mg via INTRAVENOUS
  Filled 2011-07-21 (×8): qty 1

## 2011-07-21 MED ORDER — AMLODIPINE BESYLATE 5 MG PO TABS: 10.0000 mg | ORAL_TABLET | Freq: Every day | ORAL | Status: DC

## 2011-07-21 MED ORDER — FLUTICASONE PROPIONATE 50 MCG/ACT NA SUSP
1.0000 | Freq: Every day | NASAL | Status: DC
  Administered 2011-07-21 – 2011-07-25 (×5): 1 via NASAL
  Filled 2011-07-21: qty 16

## 2011-07-21 MED ORDER — CEFAZOLIN SODIUM-DEXTROSE 2-3 GM-% IV SOLR: 2.0000 g | INTRAVENOUS | Status: DC

## 2011-07-21 MED ORDER — ONDANSETRON HCL 4 MG/2ML IJ SOLN
4.0000 mg | Freq: Once | INTRAMUSCULAR | Status: DC | PRN
Start: 2011-07-21 — End: 2011-07-21

## 2011-07-21 MED ORDER — METOCLOPRAMIDE HCL 5 MG/ML IJ SOLN: 5.0000 mg | Freq: Three times a day (TID) | INTRAMUSCULAR | Status: DC | PRN

## 2011-07-21 MED ORDER — ENOXAPARIN SODIUM 30 MG/0.3ML ~~LOC~~ SOLN
30.0000 mg | Freq: Two times a day (BID) | SUBCUTANEOUS | Status: DC
  Administered 2011-07-22 – 2011-07-25 (×7): 30 mg via SUBCUTANEOUS
  Filled 2011-07-21 (×7): qty 0.3

## 2011-07-21 MED ORDER — CEFAZOLIN SODIUM 1-5 GM-% IV SOLN
INTRAVENOUS | Status: DC | PRN
  Administered 2011-07-21: 2 g via INTRAVENOUS

## 2011-07-21 MED ORDER — LIDOCAINE HCL (CARDIAC) 10 MG/ML IV SOLN
INTRAVENOUS | Status: DC | PRN
  Administered 2011-07-21: 25 mg via INTRAVENOUS

## 2011-07-21 MED ORDER — PROPOFOL 10 MG/ML IV EMUL
INTRAVENOUS | Status: AC
  Filled 2011-07-21: qty 20

## 2011-07-21 MED ORDER — INSULIN ASPART 100 UNIT/ML ~~LOC~~ SOLN
2.0000 [IU] | Freq: Three times a day (TID) | SUBCUTANEOUS | Status: DC
  Administered 2011-07-21 – 2011-07-25 (×12): 2 [IU] via SUBCUTANEOUS
  Filled 2011-07-21: qty 3

## 2011-07-21 MED ORDER — LACTATED RINGERS IV SOLN
INTRAVENOUS | Status: DC
  Administered 2011-07-21: 12:00:00 via INTRAVENOUS

## 2011-07-21 MED ORDER — CHLORHEXIDINE GLUCONATE 4 % EX LIQD
60.0000 mL | Freq: Once | CUTANEOUS | Status: DC
  Filled 2011-07-21: qty 60

## 2011-07-21 MED ORDER — ALBUTEROL SULFATE HFA 108 (90 BASE) MCG/ACT IN AERS
2.0000 | INHALATION_SPRAY | Freq: Two times a day (BID) | RESPIRATORY_TRACT | Status: DC
  Administered 2011-07-21 – 2011-07-23 (×4): 2 via RESPIRATORY_TRACT
  Filled 2011-07-21: qty 6.7

## 2011-07-21 MED ORDER — TEMAZEPAM 15 MG PO CAPS
15.0000 mg | ORAL_CAPSULE | Freq: Every evening | ORAL | Status: DC | PRN
  Administered 2011-07-21 – 2011-07-24 (×3): 30 mg via ORAL
  Filled 2011-07-21 (×3): qty 2

## 2011-07-21 MED ORDER — MIDAZOLAM HCL 2 MG/2ML IJ SOLN
INTRAMUSCULAR | Status: AC
  Filled 2011-07-21: qty 2

## 2011-07-21 MED ORDER — ONDANSETRON HCL 4 MG PO TABS: 4.0000 mg | ORAL_TABLET | Freq: Four times a day (QID) | ORAL | Status: DC | PRN

## 2011-07-21 MED ORDER — BUDESONIDE-FORMOTEROL FUMARATE 160-4.5 MCG/ACT IN AERO
2.0000 | INHALATION_SPRAY | Freq: Two times a day (BID) | RESPIRATORY_TRACT | Status: DC
  Administered 2011-07-21 – 2011-07-25 (×8): 2 via RESPIRATORY_TRACT
  Filled 2011-07-21: qty 6

## 2011-07-21 MED ORDER — METHOCARBAMOL 500 MG PO TABS
500.0000 mg | ORAL_TABLET | Freq: Four times a day (QID) | ORAL | Status: DC | PRN
  Administered 2011-07-21 – 2011-07-25 (×2): 500 mg via ORAL
  Filled 2011-07-21 (×2): qty 1

## 2011-07-21 MED ORDER — BISACODYL 5 MG PO TBEC: 5.0000 mg | DELAYED_RELEASE_TABLET | Freq: Every day | ORAL | Status: DC | PRN

## 2011-07-21 MED ORDER — PANTOPRAZOLE SODIUM 40 MG PO TBEC
40.0000 mg | DELAYED_RELEASE_TABLET | Freq: Every day | ORAL | Status: DC
  Administered 2011-07-21 – 2011-07-25 (×5): 40 mg via ORAL
  Filled 2011-07-21 (×4): qty 1

## 2011-07-21 MED ORDER — FENTANYL CITRATE 0.05 MG/ML IJ SOLN
INTRAMUSCULAR | Status: AC
  Filled 2011-07-21: qty 2

## 2011-07-21 MED ORDER — METOCLOPRAMIDE HCL 10 MG PO TABS: 5.0000 mg | ORAL_TABLET | Freq: Three times a day (TID) | ORAL | Status: DC | PRN

## 2011-07-21 MED ORDER — BUPIVACAINE IN DEXTROSE 0.75-8.25 % IT SOLN
INTRATHECAL | Status: AC
  Filled 2011-07-21: qty 2

## 2011-07-21 MED ORDER — CEFAZOLIN SODIUM-DEXTROSE 2-3 GM-% IV SOLR
2.0000 g | Freq: Four times a day (QID) | INTRAVENOUS | Status: AC
  Administered 2011-07-21 – 2011-07-22 (×3): 2 g via INTRAVENOUS
  Filled 2011-07-21 (×2): qty 50

## 2011-07-21 MED ORDER — SIMVASTATIN 20 MG PO TABS
20.0000 mg | ORAL_TABLET | Freq: Every day | ORAL | Status: DC
  Administered 2011-07-21 – 2011-07-24 (×4): 20 mg via ORAL
  Filled 2011-07-21 (×4): qty 1

## 2011-07-21 MED ORDER — ONDANSETRON HCL 4 MG/2ML IJ SOLN
4.0000 mg | Freq: Once | INTRAMUSCULAR | Status: AC
  Administered 2011-07-21: 4 mg via INTRAVENOUS

## 2011-07-21 MED ORDER — FENTANYL CITRATE 0.05 MG/ML IJ SOLN: 25.0000 ug | INTRAMUSCULAR | Status: DC | PRN

## 2011-07-21 MED ORDER — OXYCODONE-ACETAMINOPHEN 5-325 MG PO TABS
1.0000 | ORAL_TABLET | ORAL | Status: DC | PRN
  Administered 2011-07-21: 1 via ORAL

## 2011-07-21 MED ORDER — OMEGA-3-ACID ETHYL ESTERS 1 G PO CAPS
1.0000 g | ORAL_CAPSULE | Freq: Every day | ORAL | Status: DC
  Administered 2011-07-21 – 2011-07-25 (×5): 1 g via ORAL
  Filled 2011-07-21 (×5): qty 1

## 2011-07-21 MED ORDER — ACETAMINOPHEN 500 MG PO TABS: 500.0000 mg | ORAL_TABLET | Freq: Once | ORAL | Status: DC

## 2011-07-21 MED ORDER — CEFAZOLIN SODIUM 1-5 GM-% IV SOLN
INTRAVENOUS | Status: AC
  Filled 2011-07-21: qty 50

## 2011-07-21 MED ORDER — GABAPENTIN 300 MG PO CAPS
600.0000 mg | ORAL_CAPSULE | Freq: Three times a day (TID) | ORAL | Status: DC
  Administered 2011-07-21 – 2011-07-25 (×12): 600 mg via ORAL
  Filled 2011-07-21 (×5): qty 2
  Filled 2011-07-21: qty 1
  Filled 2011-07-21 (×6): qty 2

## 2011-07-21 MED ORDER — ONDANSETRON HCL 4 MG/2ML IJ SOLN: 4.0000 mg | Freq: Four times a day (QID) | INTRAMUSCULAR | Status: DC | PRN

## 2011-07-21 SURGICAL SUPPLY — 59 items
BAG HAMPER (MISCELLANEOUS) ×2 IMPLANT
BANDAGE ACE 4 STERILE (GAUZE/BANDAGES/DRESSINGS) ×1 IMPLANT
BANDAGE ELASTIC 4 VELCRO NS (GAUZE/BANDAGES/DRESSINGS) ×1 IMPLANT
BANDAGE ELASTIC 6 VELCRO NS (GAUZE/BANDAGES/DRESSINGS) ×1 IMPLANT
BANDAGE ELASTIC 6 VELCRO ST LF (GAUZE/BANDAGES/DRESSINGS) ×1 IMPLANT
BANDAGE ESMARK 4X12 BL STRL LF (DISPOSABLE) ×1 IMPLANT
BIT DRILL 2.8X128 (BIT) ×1 IMPLANT
BLADE SURG SZ10 CARB STEEL (BLADE) ×2 IMPLANT
BNDG CMPR 12X4 ELC STRL LF (DISPOSABLE) ×1
BNDG COHESIVE 4X5 TAN NS LF (GAUZE/BANDAGES/DRESSINGS) ×2 IMPLANT
BNDG ESMARK 4X12 BLUE STRL LF (DISPOSABLE) ×2
BRACE T-SCOPE KNEE POSTOP (MISCELLANEOUS) ×2 IMPLANT
CHLORAPREP W/TINT 26ML (MISCELLANEOUS) ×2 IMPLANT
CLOTH BEACON ORANGE TIMEOUT ST (SAFETY) ×2 IMPLANT
COVER LIGHT HANDLE STERIS (MISCELLANEOUS) ×4 IMPLANT
CUFF TOURNIQUET SINGLE 24IN (TOURNIQUET CUFF) IMPLANT
CUFF TOURNIQUET SINGLE 34IN LL (TOURNIQUET CUFF) ×1 IMPLANT
CUFF TOURNIQUET SINGLE 44IN (TOURNIQUET CUFF) ×1 IMPLANT
DRAPE INCISE IOBAN 66X45 STRL (DRAPES) ×2 IMPLANT
DRSG MEPILEX BORDER 4X12 (GAUZE/BANDAGES/DRESSINGS) ×1 IMPLANT
GAUZE XEROFORM 5X9 LF (GAUZE/BANDAGES/DRESSINGS) IMPLANT
GLOVE BIOGEL PI IND STRL 7.0 (GLOVE) IMPLANT
GLOVE BIOGEL PI INDICATOR 7.0 (GLOVE) ×3
GLOVE ECLIPSE 6.5 STRL STRAW (GLOVE) ×2 IMPLANT
GLOVE SKINSENSE NS SZ8.0 LF (GLOVE) ×1
GLOVE SKINSENSE STRL SZ8.0 LF (GLOVE) ×1 IMPLANT
GLOVE SS N UNI LF 8.5 STRL (GLOVE) ×2 IMPLANT
GOWN BRE IMP SLV AUR LG STRL (GOWN DISPOSABLE) ×1 IMPLANT
GOWN STRL REIN XL XLG (GOWN DISPOSABLE) ×6 IMPLANT
INST SET MAJOR BONE (KITS) ×2 IMPLANT
KIT ROOM TURNOVER APOR (KITS) ×2 IMPLANT
MANIFOLD NEPTUNE II (INSTRUMENTS) ×2 IMPLANT
NDL HYPO 21X1.5 SAFETY (NEEDLE) ×1 IMPLANT
NDL MAYO 6 CRC TAPER PT (NEEDLE) IMPLANT
NEEDLE HYPO 21X1.5 SAFETY (NEEDLE) ×2 IMPLANT
NEEDLE MAYO 6 CRC TAPER PT (NEEDLE) IMPLANT
NS IRRIG 1000ML POUR BTL (IV SOLUTION) ×2 IMPLANT
PACK BASIC LIMB (CUSTOM PROCEDURE TRAY) ×2 IMPLANT
PAD ARMBOARD 7.5X6 YLW CONV (MISCELLANEOUS) ×2 IMPLANT
PAD CAST 4YDX4 CTTN HI CHSV (CAST SUPPLIES) ×1 IMPLANT
PADDING CAST COTTON 4X4 STRL (CAST SUPPLIES)
PASSER SUT SWANSON 36MM LOOP (INSTRUMENTS) ×1 IMPLANT
SET BASIN LINEN APH (SET/KITS/TRAYS/PACK) ×2 IMPLANT
SPLINT J IMMOBILIZER 4X20FT (CAST SUPPLIES) IMPLANT
SPLINT J PLASTER J 4INX20Y (CAST SUPPLIES)
SPONGE GAUZE 4X4 12PLY (GAUZE/BANDAGES/DRESSINGS) IMPLANT
SPONGE LAP 18X18 X RAY DECT (DISPOSABLE) ×4 IMPLANT
STAPLER VISISTAT (STAPLE) ×1 IMPLANT
STAPLER VISISTAT 35W (STAPLE) ×1 IMPLANT
SUT BRALON NAB BRD #1 30IN (SUTURE) ×2 IMPLANT
SUT ETHIBOND 5 LR DA (SUTURE) ×3 IMPLANT
SUT ETHIBOND NAB OS 4 #2 30IN (SUTURE) IMPLANT
SUT ETHILON 3 0 FSL (SUTURE) ×1 IMPLANT
SUT MON AB 0 CT1 (SUTURE) ×4 IMPLANT
SUT MON AB 2-0 CT1 36 (SUTURE) ×2 IMPLANT
SUT PROLENE 3 0 PS 1 (SUTURE) IMPLANT
SYR 30ML LL (SYRINGE) ×2 IMPLANT
SYR BULB IRRIGATION 50ML (SYRINGE) ×2 IMPLANT
YANKAUER SUCT BULB TIP 10FT TU (MISCELLANEOUS) ×2 IMPLANT

## 2011-07-21 NOTE — Op Note (Signed)
07/21/2011  3:03 PM  PATIENT:  [name]  61 y.o. male  PRE-OPERATIVE DIAGNOSIS:  quad tear right  POST-OPERATIVE DIAGNOSIS:  quad tear right  PROCEDURE:  Procedure(s): REPAIR RIGHT QUADRICEPS TENDON  SURGEON:  Surgeon(s): Arther Abbott, MD  PHYSICIAN ASSISTANT:   ASSISTANTS: YES    ANESTHESIA:   spinal  EBL:  Total I/O In: 1000 [I.V.:1000] Out: 0   BLOOD ADMINISTERED:none  DRAINS: none   LOCAL MEDICATIONS USED:  MARCAINE WITH EPI 30 CC  SPECIMEN:  No Specimen  DISPOSITION OF SPECIMEN:  N/A  COUNTS:  YES  TOURNIQUET:   Total Tourniquet Time Documented: Thigh (Right) - 59 minutes  DICTATION: .Viviann Spare Dictation  PLAN OF CARE: Admit to inpatient   PATIENT DISPOSITION:  PACU - hemodynamically stable.   Delay start of Pharmacological VTE agent (>24hrs) due to surgical blood loss or risk of bleeding:  YES    DETAILS:  Details.  The patient's right knee was marked as a surgical site, he was brought to the operating room for spinal anesthetic.  The right knee was then prepped and draped sterile technique.The timeout procedure was executed.  The limb was exsanguinated with a 4 inch Esmarch the tourniquet was elevated to 300 mm of mercury.   A straight midline incision was made over the patella and extended proximally over the quadriceps tendon distally over the patella tendon. Full-thickness flaps were created. The tear was identified and it extended into the medial lateral retinaculum. There were some fibers of tendon still left intact. There was a large joint effusion it was irrigated the tendon edges were debrided. 2 drill holes were passed through the patella and 4 #5 Tycron sutures which were passed through the tendon were passed through the drill holes. The sutures were tied over the patellar tendon and over the patella and then a 0 Monocryl in running fashion was used to close the retinacular defects. The knee was then taken through range of motion. The  knee was range from 0-45 before there was any tension on the repair.  The wounds were irrigated with saline. The subcutaneous tissue was closed with 0 Monocryl followed by reapproximation of the skin edges with staples.  A sterile dressing was applied over the incision. The leg was wrapped with an Ace bandage from toes to groin and a locked T. scope brace was applied in full extension.  Postoperative plan full weightbearing as tolerated in extension in the brace with crutches.  Range of motion will be started at postop week #2 starting at 0-40.

## 2011-07-21 NOTE — Interval H&P Note (Signed)
History and Physical Interval Note:  07/21/2011 12:37 PM  [name] [name]  has presented today for surgery, with the diagnosis of quad tear right  The various methods of treatment have been discussed with the patient and family. After consideration of risks, benefits and other options for treatment, the patient has consented to  Procedure(s): REPAIR RIGHT QUADRICEPS TENDON as a surgical intervention .  The patients' history has been reviewed, patient examined, no change in status, stable for surgery.  I have reviewed the patients' chart and labs.  Questions were answered to the patient's satisfaction.     Arther Abbott

## 2011-07-21 NOTE — Transfer of Care (Signed)
Immediate Anesthesia Transfer of Care Note  Patient: [name] [name]  Procedure(s) Performed:  REPAIR QUADRICEP TENDON  Patient Location: PACU  Anesthesia Type: Spinal  Level of Consciousness: awake, alert  and oriented  Airway & Oxygen Therapy: Patient Spontanous Breathing  Post-op Assessment: Report given to PACU RN  Post vital signs: Reviewed and stable  Complications: No apparent anesthesia complications

## 2011-07-21 NOTE — Anesthesia Postprocedure Evaluation (Addendum)
  Anesthesia Post-op Note  Patient: [name] [name]  Procedure(s) Performed:  REPAIR QUADRICEP TENDON  Patient Location: PACU  Anesthesia Type: General  Level of Consciousness: awake, alert  and oriented  Airway and Oxygen Therapy: Patient Spontanous Breathing and Patient connected to face mask oxygen  Post-op Pain: none  Post-op Assessment: Post-op Vital signs reviewed, Patient's Cardiovascular Status Stable, Respiratory Function Stable and No signs of Nausea or vomiting  Post-op Vital Signs: Reviewed and stable  Complications: No apparent anesthesia complications [id number]:  Pt up in chair.  VSS.  Pt denies headache or backache and states that sensation has returned to normal.  No apparent anesthesia complications.

## 2011-07-21 NOTE — Brief Op Note (Signed)
07/21/2011  3:03 PM  PATIENT:  [name]  61 y.o. male  PRE-OPERATIVE DIAGNOSIS:  quad tear right  POST-OPERATIVE DIAGNOSIS:  quad tear right  PROCEDURE:  Procedure(s): REPAIR RIGHT QUADRICEPS TENDON  SURGEON:  Surgeon(s): Arther Abbott, MD  PHYSICIAN ASSISTANT:   ASSISTANTS: YES    ANESTHESIA:   spinal  EBL:  Total I/O In: 1000 [I.V.:1000] Out: 0   BLOOD ADMINISTERED:none  DRAINS: none   LOCAL MEDICATIONS USED:  MARCAINE WITH EPI 30 CC  SPECIMEN:  No Specimen  DISPOSITION OF SPECIMEN:  N/A  COUNTS:  YES  TOURNIQUET:   Total Tourniquet Time Documented: Thigh (Right) - 59 minutes  DICTATION: .Viviann Spare Dictation  PLAN OF CARE: Admit to inpatient   PATIENT DISPOSITION:  PACU - hemodynamically stable.   Delay start of Pharmacological VTE agent (>24hrs) due to surgical blood loss or risk of bleeding:  YES

## 2011-07-21 NOTE — Anesthesia Preprocedure Evaluation (Addendum)
Anesthesia Evaluation  Patient identified by MRN, date of birth, ID band Patient awake    Reviewed: Allergy & Precautions, H&P , NPO status , Patient's Chart, lab work & pertinent test results  History of Anesthesia Complications Negative for: history of anesthetic complications  Airway Mallampati: II TM Distance: >3 FB Neck ROM: Full    Dental  (+) Teeth Intact   Pulmonary sleep apnea and Continuous Positive Airway Pressure Ventilation , COPD COPD inhaler,  clear to auscultation        Cardiovascular hypertension, Pt. on medications Regular Normal    Neuro/Psych PSYCHIATRIC DISORDERS Anxiety Depression  Neuromuscular disease    GI/Hepatic PUD, GERD-  Medicated and Controlled,  Endo/Other  Diabetes mellitus-, Poorly Controlled, Type 2, Insulin DependentMorbid obesity  Renal/GU      Musculoskeletal  (+) Arthritis -, Osteoarthritis,    Abdominal   Peds  Hematology  (+) Blood dyscrasia, anemia ,   Anesthesia Other Findings   Reproductive/Obstetrics                         Anesthesia Physical Anesthesia Plan  ASA: III  Anesthesia Plan: Spinal   Post-op Pain Management:    Induction: Intravenous  Airway Management Planned: Simple Face Mask  Additional Equipment:   Intra-op Plan:   Post-operative Plan:   Informed Consent: I have reviewed the patients History and Physical, chart, labs and discussed the procedure including the risks, benefits and alternatives for the proposed anesthesia with the patient or authorized representative who has indicated his/her understanding and acceptance.     Plan Discussed with: CRNA  Anesthesia Plan Comments: Timmothy Sours not add fentanyl to bupivicaine for spinal-might cause excessive itching post op.)        Anesthesia Quick Evaluation

## 2011-07-21 NOTE — H&P (View-Only) (Signed)
[name] [name] is an 61 y.o. male.   Chief Complaint: RIGHT knee pain HPI: A 61 year old male was injured on December 24 he slipped while getting a pale water.   He complains of intense pain at the superior pole of the patella and inability to perform a straight leg raise is having difficulty walking.  He has sharp stabbing burning 7/10 constant pain relieved by Percocet  He has some numbness and tingling in his leg with some locking and swelling of the knee  Review of systems he reports a history of fatigue redness ordering of the eyes blurred vision he has a facial nerve palsy from Bell's palsy.  He has had some shortness of breath and wheezing with cough and tightness of his chest with snoring, heartburn nausea diarrhea.  Numbness and tingling in the leg nervousness and depression excessive thirst and urination is also reported    Past Medical History  Diagnosis Date  . COPD (chronic obstructive pulmonary disease)   . Sinusitis   . PUD (peptic ulcer disease)     remote, reports f/u EGD about 8 years ago unremarkable   . Diabetes   . GERD (gastroesophageal reflux disease)   . Hyperlipidemia   . Hypertension   . Sleep apnea   . Post traumatic stress disorder   . Anxiety   . Depression     Past Surgical History  Procedure Date  . Asad lt shoulder 12/2008    left shoulder  . Umbilical hernia repair [id number]    [place]  . Knee arthroscopy 10/2007    left  . Toenail excision     removed x2  . Eye surgery 12/22/2010    tear duct probing-[place]  . Foreign body removal 03/29/2011    Procedure: REMOVAL FOREIGN BODY EXTREMITY;  Surgeon: Arther Abbott, MD;  Location: [facility];  Service: Orthopedics;  Laterality: Right;  Removal Foreign Body Right Thumb    Family History  Problem Relation Age of Onset  . Hypertension Mother     MI  . Cancer Mother     breast   . Diabetes Mother   . Diabetes Father   . Hypertension Father   . Hypertension Sister   . Diabetes Sister   . Arthritis     . Asthma    . Lung disease    . Anesthesia problems Neg Hx   . Hypotension Neg Hx   . Malignant hyperthermia Neg Hx   . Pseudochol deficiency Neg Hx    Social History:  reports that he quit smoking about 15 months ago. His smoking use included Cigarettes. He has a 25 pack-year smoking history. He does not have any smokeless tobacco history on file. He reports that he uses illicit drugs (Cocaine). He reports that he does not drink alcohol.  Allergies:  Allergies  Allergen Reactions  . Tramadol Itching    Medications Prior to Admission  Medication Sig Dispense Refill  . albuterol (PROVENTIL HFA) 108 (90 BASE) MCG/ACT inhaler Inhale 2 puffs into the lungs 2 (two) times daily.        Marland Kitchen amLODipine (NORVASC) 10 MG tablet Take 10 mg by mouth daily.        . budesonide-formoterol (SYMBICORT) 160-4.5 MCG/ACT inhaler Inhale 2 puffs into the lungs 2 (two) times daily.        . Cyanocobalamin (B-12 PO) Take 1 tablet by mouth daily.        Marland Kitchen exenatide (BYETTA) 10 MCG/0.04ML SOLN Inject into the skin 2 (two) times  daily with a meal.        . fish oil-omega-3 fatty acids 1000 MG capsule Take 1 g by mouth daily.        Marland Kitchen gabapentin (NEURONTIN) 600 MG tablet Take 600 mg by mouth 3 (three) times daily.        . insulin glargine (LANTUS) 100 UNIT/ML injection Inject 70 Units into the skin at bedtime.       . insulin lispro (HUMALOG) 100 UNIT/ML injection Inject into the skin 3 (three) times daily before meals. Per sliding scale instructions      . mometasone (NASONEX) 50 MCG/ACT nasal spray 2 sprays by Nasal route daily.        . Multiple Vitamins-Minerals (CENTRUM SILVER ULTRA MENS) TABS Take 1 tablet by mouth daily.       Marland Kitchen omeprazole (PRILOSEC) 20 MG capsule Take 20 mg by mouth daily.        Marland Kitchen oxyCODONE-acetaminophen (PERCOCET) 5-325 MG per tablet Take 2 tablets by mouth every 4 (four) hours as needed for pain.  30 tablet  0  . simvastatin (ZOCOR) 20 MG tablet Take 20 mg by mouth at bedtime.          Marland Kitchen HYDROcodone-acetaminophen (NORCO) 5-325 MG per tablet Take 1 tablet by mouth every 4 (four) hours as needed for pain.  60 tablet  5   No current facility-administered medications on file as of 07/20/2011.    No results found for this or any previous visit (from the past 48 hour(s)). No results found.  Review of Systems  Musculoskeletal: Positive for joint pain and falls.  All other systems reviewed and are negative.    Blood pressure 150/90, height 5\' 4"  (1.626 m), weight 233 lb (105.688 kg). Physical Exam  Nursing note and vitals reviewed. Constitutional: He is oriented to person, place, and time. He appears well-developed and well-nourished. No distress.  HENT:  Head: Normocephalic and atraumatic.  Neck: Normal range of motion.  Cardiovascular: Normal rate.   Respiratory: Effort normal.  GI: Soft.  Musculoskeletal:       Right shoulder: Normal.       Left shoulder: He exhibits normal range of motion and no tenderness.       Right elbow: Normal.      Left elbow: Normal.       Right wrist: Normal.       Left wrist: Normal.       Right hip: Normal.       Left hip: Normal.       Right knee: He exhibits decreased range of motion, swelling, effusion, ecchymosis and abnormal patellar mobility. He exhibits normal alignment and no LCL laxity. tenderness found. Patellar tendon tenderness noted.       Left knee: Normal.       Left upper leg: He exhibits tenderness and swelling.  Neurological: He is alert and oriented to person, place, and time. He has normal reflexes. A cranial nerve deficit is present.       CN 7 facial nerve is resolving  Skin: Skin is warm and dry.  Psychiatric: He has a normal mood and affect. His behavior is normal. Judgment and thought content normal.     Assessment/Plan Plain film and MRI were reviewed  His quadriceps tendon rupture RIGHT lower extremity  Recommend repair  There is no real indication for non-operative treatment for this injury. The  patient should expect a 95% chance of returning to normal function with residual mild loss of some  knee flexion and some weakness in the RIGHT leg.  Arther Abbott 07/20/2011, 2:34 PM

## 2011-07-21 NOTE — Anesthesia Procedure Notes (Signed)
Spinal  Patient location during procedure: OR Start time: 07/21/2011 1:29 PM Staffing CRNA/Resident: ANDRAZA, AMY Preanesthetic Checklist Completed: patient identified, site marked, surgical consent, pre-op evaluation, timeout performed, IV checked, risks and benefits discussed and monitors and equipment checked Spinal Block Patient position: right lateral decubitus Prep: Betadine Patient monitoring: heart rate, cardiac monitor, continuous pulse ox and blood pressure Approach: right paramedian Location: L3-4 Injection technique: single-shot Needle Needle type: Quincke  Needle gauge: 22 G Needle length: 12.7 cm Assessment Sensory level: T8 Additional Notes SAB block attempted by CRNA, unsuccessful.  Dr. Mirna Mires in and attempted x 2.  Second attempt successful with 5" spinal needle.  Marcaine .75%, 1.5cc.  Tray # [id number], exp. 05/2012.

## 2011-07-21 NOTE — Preoperative (Signed)
Beta Blockers   Reason not to administer Beta Blockers:Not Applicable 

## 2011-07-22 LAB — GLUCOSE, CAPILLARY: Glucose-Capillary: 168 mg/dL — ABNORMAL HIGH (ref 70–99)

## 2011-07-22 MED ORDER — AMLODIPINE BESYLATE 5 MG PO TABS
10.0000 mg | ORAL_TABLET | Freq: Every day | ORAL | Status: DC
  Administered 2011-07-22 – 2011-07-25 (×4): 10 mg via ORAL
  Filled 2011-07-22 (×4): qty 2

## 2011-07-22 MED ORDER — OXYCODONE HCL 5 MG PO TABS
10.0000 mg | ORAL_TABLET | ORAL | Status: DC | PRN
  Administered 2011-07-22 – 2011-07-25 (×12): 10 mg via ORAL
  Filled 2011-07-22 (×4): qty 2
  Filled 2011-07-22: qty 1
  Filled 2011-07-22: qty 2
  Filled 2011-07-22: qty 1
  Filled 2011-07-22 (×7): qty 2

## 2011-07-22 MED ORDER — HYDROMORPHONE HCL PF 1 MG/ML IJ SOLN: 1.0000 mg | INTRAMUSCULAR | Status: DC | PRN

## 2011-07-22 MED ORDER — OXYCODONE HCL 10 MG PO TB12
10.0000 mg | ORAL_TABLET | Freq: Two times a day (BID) | ORAL | Status: DC
  Administered 2011-07-22 – 2011-07-25 (×6): 10 mg via ORAL
  Filled 2011-07-22 (×6): qty 1

## 2011-07-22 NOTE — Progress Notes (Signed)
Subjective: 1 Day Post-Op Procedure(s) (LRB): REPAIR QUADRICEP TENDON (Right) Patient reports pain as 10 on 0-10 scale.    Objective: Vital signs in last 24 hours: Temp:  [97.3 F (36.3 C)-100.2 F (37.9 C)] 99.1 F (37.3 C) (01/05 0429) Pulse Rate:  [68-106] 97  (01/05 0429) Resp:  [12-35] 24  (01/05 0429) BP: (102-205)/(56-108) 181/89 mmHg (01/05 0429) SpO2:  [80 %-98 %] 86 % (01/05 0810) Weight:  [105.688 kg (233 lb)] 233 lb (105.688 kg) (01/04 1609)  Intake/Output from previous day: 01/04 0701 - 01/05 0700 In: 1495 [P.O.:395; I.V.:1100] Out: 1675 [Urine:1675] Intake/Output this shift:     Basename 07/20/11 1510  HGB 11.4*    Basename 07/20/11 1510  WBC --  RBC --  HCT 35.0*  PLT --    Basename 07/20/11 1510  NA 140  K 3.9  CL 105  CO2 31  BUN 18  CREATININE 1.56*  GLUCOSE 264*  CALCIUM 9.6   No results found for this basename: LABPT:2,INR:2 in the last 72 hours  Neurologically intact Neurovascular intact Sensation intact distally Dorsiflexion/Plantar flexion intact Compartment soft  Assessment/Plan: 1 Day Post-Op Procedure(s) (LRB): REPAIR QUADRICEP TENDON (Right) Advance diet Up with therapy Oral pain med should help BP  Arther Abbott 07/22/2011, 8:56 AM

## 2011-07-22 NOTE — Progress Notes (Signed)
Patients BP is running  high, MD paged at approximately 0516 then again at [place] with no return call.  Patient is currently nonsymptomatic and resting comfortably in his room. Will continue to monitor and inform the next shift nurse.

## 2011-07-22 NOTE — Progress Notes (Signed)
NAMEMarland Kitchen  TRINA, WINNIE NO.:  [id number]  MEDICAL RECORD NO.:  [id number]  LOCATION:  [id number]                          FACILITY:  [facility]  PHYSICIAN:  Lurdes Haltiwanger D. Legrand Rams, MD   DATE OF BIRTH:  10-23-1950  DATE OF PROCEDURE:  07/22/2011 DATE OF DISCHARGE:                                PROGRESS NOTE   SUBJECTIVE:  The patient was admitted under Orthopedics Service after he had repair of his quadriceps tendon.  The patient fell on July 10, 2011, and sustained rupture of his quadriceps tendon.  The patient currently is resting.  He is having some pain on his knee where he had surgery.  No fever or chills.  OBJECTIVE:  GENERAL:  The patient is alert, awake, and resting. VITAL SIGNS:  Blood pressure 102/59, pulse 68, respiratory rate 12, and temperature 97 degrees Fahrenheit. CHEST:  Clear lung fields.  Good air entry. Cardiovascular:  First and second heart sounds heard.  No murmur, no gallop. ABDOMEN:  Soft and lax.  Bowel sound is positive.  No mass or organomegaly. EXTREMITIES:  The patient has dressing and immobilization of patient of his right lower extremity.  LABORATORY DATA:  Glucose 219.  ASSESSMENT: 1. History of right quadriceps tendon tear and status post repair. 2. Diabetes mellitus. 3. Hypertension. 4. History of chronic obstructive pulmonary disease. 5. History of peptic ulcer disease. 6. Hyperlipidemia. 7. Sleep apnea syndrome.  PLAN:  We will continue the patient on his regular medications. Continue Accu-Chek with a sliding scale coverage.  Continue pain management.  Continue current treatment according to Orthopedics recommendation.     Kerstie Agent D. Legrand Rams, MD     TDF/MEDQ  D:  07/22/2011  T:  07/22/2011  Job:  [id number]

## 2011-07-23 LAB — GLUCOSE, CAPILLARY
Glucose-Capillary: 205 mg/dL — ABNORMAL HIGH (ref 70–99)
Glucose-Capillary: 223 mg/dL — ABNORMAL HIGH (ref 70–99)

## 2011-07-23 MED ORDER — ALBUTEROL SULFATE (5 MG/ML) 0.5% IN NEBU
2.5000 mg | INHALATION_SOLUTION | Freq: Four times a day (QID) | RESPIRATORY_TRACT | Status: DC
  Administered 2011-07-23 – 2011-07-25 (×9): 2.5 mg via RESPIRATORY_TRACT
  Filled 2011-07-23 (×11): qty 0.5

## 2011-07-23 NOTE — Progress Notes (Signed)
Subjective: 2 Days Post-Op Procedure(s) (LRB): REPAIR QUADRICEP TENDON (Right) Patient reports pain as improved.    Objective: Vital signs in last 24 hours: Temp:  [98.4 F (36.9 C)-101.3 F (38.5 C)] 99.5 F (37.5 C) (01/06 0544) Pulse Rate:  [99-105] 102  (01/06 0544) Resp:  [18-24] 24  (01/06 0544) BP: (161-183)/(83-97) 161/93 mmHg (01/06 0914) SpO2:  [84 %-97 %] 97 % (01/06 0809)  Intake/Output from previous day: 01/05 0701 - 01/06 0700 In: 120 [P.O.:120] Out: 1475 [Urine:1475] Intake/Output this shift:     Basename 07/20/11 1510  HGB 11.4*    Basename 07/20/11 1510  WBC --  RBC --  HCT 35.0*  PLT --    Basename 07/20/11 1510  NA 140  K 3.9  CL 105  CO2 31  BUN 18  CREATININE 1.56*  GLUCOSE 264*  CALCIUM 9.6   No results found for this basename: LABPT:2,INR:2 in the last 72 hours  Neurologically intact Neurovascular intact Sensation intact distally Intact pulses distally Incision: dressing C/D/I Compartment soft  Assessment/Plan:  PAIN MUCH BETTER AND BP IS DOWN WILL NEED OUTPATIENT BP TIGHTER CONTROL  2 Days Post-Op Procedure(s) (LRB): REPAIR QUADRICEP TENDON (Right) Up with therapy ALBUTEROL NEBS FOR FEVER   Arther Abbott 07/23/2011, 10:48 AM

## 2011-07-23 NOTE — Progress Notes (Signed)
Physical Therapy Evaluation Patient Details Name: [name] [name] MRN: [id number] DOB: 18-Aug-1950 Today's Date: 07/23/2011  Problem List:  Patient Active Problem List  Diagnoses  . DIABETES MELLITUS-TYPE II  . HYPERLIPIDEMIA  . HYPERTENSION  . GERD  . HEMATOCHEZIA  . SHOULDER, ARTHRITIS, DEGEN./OSTEO  . KNEE, ARTHRITIS, DEGEN./OSTEO  . DERANGEMENT MENISCUS  . SHOULDER PAIN  . BACK PAIN  . TENDINITIS, CALCIFIC, SHOULDER, LEFT  . IMPINGEMENT SYNDROME  . RUPTURE ROTATOR CUFF  . OA (osteoarthritis) of knee  . Chronic pain  . CTS (carpal tunnel syndrome)  . Foreign body of thumb, right  . Quadriceps tendon rupture    Past Medical History:  Past Medical History  Diagnosis Date  . COPD (chronic obstructive pulmonary disease)   . Sinusitis   . PUD (peptic ulcer disease)     remote, reports f/u EGD about 8 years ago unremarkable   . Diabetes   . GERD (gastroesophageal reflux disease)   . Hyperlipidemia   . Hypertension   . Sleep apnea   . Post traumatic stress disorder   . Anxiety   . Depression   . Bell palsy    Past Surgical History:  Past Surgical History  Procedure Date  . Asad lt shoulder 12/2008    left shoulder  . Umbilical hernia repair [id number]    [place]  . Knee arthroscopy 10/2007    left  . Toenail excision     removed x2-bilateral  . Eye surgery 12/22/2010    tear duct probing-  . Foreign body removal 03/29/2011    Procedure: REMOVAL FOREIGN BODY EXTREMITY;  Surgeon: Arther Abbott, MD;  Location: [facility];  Service: Orthopedics;  Laterality: Right;  Removal Foreign Body Right Thumb    PT Assessment/Plan/Recommendation PT Assessment Clinical Impression Statement: Pt s/p quadricep tendon repair with decreased mobility who will benefit from skilled PT for gait training in acute venue and will benefit from out-patient therapy in a few weeks. PT Recommendation/Assessment: Patient will need skilled PT in the acute care venue PT Problem List: Decreased  knowledge of use of DME;Decreased mobility PT Therapy Diagnosis : Difficulty walking PT Plan PT Frequency: Min 5X/week PT Treatment/Interventions: DME instruction;Gait training;Stair training PT Recommendation Follow Up Recommendations: Outpatient PT Equipment Recommended: None recommended by PT PT Goals  Acute Rehab PT Goals PT Goal Formulation: With patient Time For Goal Achievement: 3 days Pt will go Supine/Side to Sit: with modified independence Pt will go Sit to Stand: Independently Pt will go Stand to Sit: Independently Pt will Stand: with modified independence Pt will Ambulate: >150 feet;with modified independence;with crutches;with other equipment (comment);Other (comment) (knee immobilizer) Pt will Go Up / Down Stairs: 1-2 stairs;with crutches;with supervision  PT Evaluation Precautions/Restrictions  Precautions Required Braces or Orthoses: Yes Restrictions Weight Bearing Restrictions: No Prior Functioning  Home Living Lives With: Spouse Receives Help From: Family Type of Home: House Home Layout: One level Home Access: Stairs to enter Entrance Stairs-Rails: Right Entrance Stairs-Number of Steps: 2 Bathroom Shower/Tub: Chiropodist: Standard Prior Function Level of Independence: Independent with basic ADLs Vocation: On disability Cognition Cognition Arousal/Alertness: Awake/alert Overall Cognitive Status: Appears within functional limits for tasks assessed Orientation Level: Oriented X4 Sensation/Coordination Sensation Light Touch: Appears Intact Coordination Gross Motor Movements are Fluid and Coordinated: Yes Fine Motor Movements are Fluid and Coordinated: Not tested Extremity Assessment RUE Assessment RUE Assessment: Within Functional Limits LUE Assessment LUE Assessment: Within Functional Limits RLE Assessment RLE Assessment: Not tested LLE Assessment LLE Assessment: Within Functional Limits  Mobility (including Balance) Bed  Mobility Bed Mobility: No Transfers Transfers: Yes Sit to Stand: 6: Modified independent (Device/Increase time) Stand to Sit: 6: Modified independent (Device/Increase time) Ambulation/Gait Ambulation/Gait: Yes Ambulation/Gait Assistance: 6: Modified independent (Device/Increase time) Ambulation Distance (Feet): 85 Feet Assistive device: Crutches;Other (Comment) (knee immobilizer) Gait Pattern: Step-to pattern Gait velocity: slower than normal but wfl Stairs: No Wheelchair Mobility Wheelchair Mobility: No  Posture/Postural Control Posture/Postural Control: No significant limitations Balance Balance Assessed: No Exercise    End of Session PT - End of Session Equipment Utilized During Treatment: Gait belt;Right knee immobilizer Activity Tolerance: Patient tolerated treatment well Patient left: in bed;with call bell in reach;with bed alarm set Nurse Communication: Mobility status for transfers General Behavior During Session: [facility] for tasks performed Cognition: [facility] for tasks performed  Shellby Schlink,CINDY 07/23/2011, 12:07 PM

## 2011-07-23 NOTE — Addendum Note (Signed)
Addendum  created 07/23/11 1304 by Tressie Stalker   Modules edited:Notes Section

## 2011-07-24 ENCOUNTER — Ambulatory Visit: Payer: Medicare Other | Admitting: Orthopedic Surgery

## 2011-07-24 LAB — GLUCOSE, CAPILLARY: Glucose-Capillary: 174 mg/dL — ABNORMAL HIGH (ref 70–99)

## 2011-07-24 MED ORDER — LOSARTAN POTASSIUM 50 MG PO TABS
50.0000 mg | ORAL_TABLET | Freq: Every day | ORAL | Status: DC
  Administered 2011-07-24 – 2011-07-25 (×2): 50 mg via ORAL
  Filled 2011-07-24 (×2): qty 1

## 2011-07-24 NOTE — Progress Notes (Signed)
Physical Therapy Treatment Patient Details Name: [name] [name] MRN: [id number] DOB: 07/06/1951 Today's Date: 07/24/2011  TIME: 906-925/20mins GT-1mins TE  PT Assessment/Plan  PT - Assessment/Plan Comments on Treatment Session: Pt tolerated all treatment well;ambulated 150' with knee immob and crutches with supervision;NO LOB;verbal cues needed for heel/toe. Stair training next session PT Goals  Acute Rehab PT Goals PT Goal: Stand to Sit - Progress: Progressing toward goal PT Goal: Stand - Progress: Progressing toward goal PT Goal: Ambulate - Progress: Progressing toward goal  PT Treatment Precautions/Restrictions  Precautions Required Braces or Orthoses: Yes Restrictions Weight Bearing Restrictions: Yes Mobility (including Balance) Transfers Transfers: Yes Sit to Stand: 6: Modified independent (Device/Increase time) Stand to Sit: 6: Modified independent (Device/Increase time) Ambulation/Gait Ambulation/Gait: Yes Ambulation/Gait Assistance: 5: Supervision Ambulation/Gait Assistance Details (indicate cue type and reason): Verbal cues for heel/toe gait Ambulation Distance (Feet): 150 Feet Assistive device: Crutches Stairs: No Wheelchair Mobility Wheelchair Mobility: No    Exercise  General Exercises - Lower Extremity Ankle Circles/Pumps: Right;20 reps Quad Sets: Right;10 reps Toe Raises: 15 reps;Left Heel Raises: 15 reps;Left End of Session PT - End of Session Activity Tolerance: Patient tolerated treatment well Patient left: in chair;with call bell in reach (chair alarm on) General Behavior During Session: [facility] for tasks performed Cognition: [facility] for tasks performed  Avi Archuleta, [place] 07/24/2011, 9:37 AM

## 2011-07-24 NOTE — Progress Notes (Signed)
CARE MANAGEMENT NOTE 07/24/2011  Patient:  [name] [name], [name] [name]   Account Number:  [id number]  Date Initiated:  07/24/2011  Documentation initiated by:  Claretha Cooper  Subjective/Objective Assessment:   Pt admitted with R. Quadicep tenden rupture. PTA, pt was independent w/ ADLs and lived at home with spouse.     Action/Plan:   Pt had a surgical repair. Pt to discharge with outpatient physical therapy   Anticipated DC Date:  07/25/2011   Anticipated DC Plan:  [place]  CM consult      Choice offered to / List presented to:             Status of service:  In process, will continue to follow Medicare Important Message given?   (If response is "NO", the following Medicare IM given date fields will be blank) Date Medicare IM given:   Date Additional Medicare IM given:    Discharge Disposition:    Per UR Regulation:    Comments:  07/24/11 [place] BSN CM

## 2011-07-24 NOTE — Progress Notes (Signed)
Called to room by wife stating patient's IV pulled out.  MD paged with no return call.  Patient asked for IV to remain out because he may be d/c home in am.  No IV meds ordered at this time.  Left IV out per patient's wishes.  Schonewitz, Eulis Canner 07/24/2011

## 2011-07-24 NOTE — Progress Notes (Signed)
NAMEMarland Kitchen  DENZELLE, FARD NO.:  [id number]  MEDICAL RECORD NO.:  [id number]  LOCATION:  [id number]                          FACILITY:  [facility]  PHYSICIAN:  Aniqa Hare D. Legrand Rams, MD   DATE OF BIRTH:  [birth year]  DATE OF PROCEDURE:  07/24/2011 DATE OF DISCHARGE:                                PROGRESS NOTE   SUBJECTIVE:  The patient continued to complain of pain on his right knee where he had surgery.  His blood pressure was also running high.  OBJECTIVE:  GENERAL:  The patient is alert, awake, and sick looking. VITAL SIGNS:  Blood pressure 182/97, pulse 95, respiratory rate 24, and temperature 99 degrees Fahrenheit. CHEST:  Clear lung fields.  Good air entry. CARDIOVASCULAR:  First and second heart sounds heard.  No murmur.  No gallop. ABDOMEN:  Soft and lax.  Bowel sound is positive.  No mass or organomegaly. EXTREMITIES:  The patient has dressed wound on the right knee area.  His right lower extremity is splinted.  ASSESSMENT: 1. History of right quadriceps tendon tear and status post repair. 2. Hypertension, poorly controlled. 3. Diabetes mellitus. 4. History of back pain.  PLAN:  We will start the patient on losartan 50 mg p.o. daily.  We will continue Norvasc.  Continue his regular medications and current plan as per Orthopedics.     Eymi Lipuma D. Legrand Rams, MD     TDF/MEDQ  D:  07/24/2011  T:  07/24/2011  Job:  [id number]

## 2011-07-25 ENCOUNTER — Telehealth: Payer: Self-pay | Admitting: *Deleted

## 2011-07-25 LAB — GLUCOSE, CAPILLARY
Glucose-Capillary: 155 mg/dL — ABNORMAL HIGH (ref 70–99)
Glucose-Capillary: 245 mg/dL — ABNORMAL HIGH (ref 70–99)

## 2011-07-25 MED ORDER — ALBUTEROL SULFATE HFA 108 (90 BASE) MCG/ACT IN AERS: 2.0000 | INHALATION_SPRAY | Freq: Four times a day (QID) | RESPIRATORY_TRACT | Status: DC | PRN

## 2011-07-25 MED ORDER — IBUPROFEN 800 MG PO TABS: 800.0000 mg | ORAL_TABLET | Freq: Three times a day (TID) | ORAL | Status: AC | PRN

## 2011-07-25 MED ORDER — CYCLOBENZAPRINE HCL 10 MG PO TABS: 10.0000 mg | ORAL_TABLET | Freq: Three times a day (TID) | ORAL | Status: DC | PRN

## 2011-07-25 MED ORDER — OXYCODONE HCL 10 MG PO TB12: 10.0000 mg | ORAL_TABLET | Freq: Two times a day (BID) | ORAL | Status: DC

## 2011-07-25 MED ORDER — OXYCODONE-ACETAMINOPHEN 5-325 MG PO TABS: 1.0000 | ORAL_TABLET | ORAL | Status: DC | PRN

## 2011-07-25 MED ORDER — METHOCARBAMOL 500 MG PO TABS: 500.0000 mg | ORAL_TABLET | Freq: Four times a day (QID) | ORAL | Status: DC | PRN

## 2011-07-25 MED ORDER — IBUPROFEN 800 MG PO TABS
800.0000 mg | ORAL_TABLET | Freq: Once | ORAL | Status: AC
  Administered 2011-07-25: 800 mg via ORAL
  Filled 2011-07-25: qty 1

## 2011-07-25 MED ORDER — OXYCODONE-ACETAMINOPHEN 7.5-325 MG PO TABS: 1.0000 | ORAL_TABLET | ORAL | Status: DC | PRN

## 2011-07-25 NOTE — Progress Notes (Signed)
NAMEMarland Kitchen  [name], [name] [name] NO.:  [id number]  MEDICAL RECORD NO.:  [id number]  LOCATION:  [id number]                          FACILITY:  [facility]  PHYSICIAN:  Natia Fahmy D. Legrand Rams, MD   DATE OF BIRTH:  05-01-1951  DATE OF PROCEDURE:  07/25/2011 DATE OF DISCHARGE:                                PROGRESS NOTE   SUBJECTIVE:  The patient feels better.  He started ambulating with a walker.  No new complaints.  OBJECTIVE:  GENERAL:  The patient is alert, awake, and not in any form of distress. VITAL SIGNS:  Blood pressure 152/68 pulse 80 respiratory rate 20, temperature 98.3 degrees Fahrenheit. CHEST:  Clear lung fields.  Good air entry. CARDIOVASCULAR SYSTEM:  First and second heart sound heard.  No murmur. No gallop. ABDOMEN:  Soft and lax.  Bowel sound is positive.  No mass or organomegaly. EXTREMITIES:  The patient has dressing on the right knee area.  ASSESSMENT: 1. History of quadriceps tendon rupture and status post surgical     repair. 2. Hypertension, gradually improving. 3. Diabetes mellitus type 2. 4. Chronic back pain.  PLAN:  Continue the patient on current medications.  Continue physical therapy and occupational therapy.  Continue regular treatment and supportive care.     Rei Contee D. Legrand Rams, MD     TDF/MEDQ  D:  07/25/2011  T:  07/25/2011  Job:  [id number]

## 2011-07-25 NOTE — Discharge Summary (Signed)
Physician Discharge Summary  Patient ID: [name] [name] MRN: [id number] DOB/AGE: December 19, 1950 61 y.o.  Admit date: 07/21/2011 Discharge date: 07/25/2011  Admission Diagnoses:quad rupture right   Discharge Diagnoses: same  Active Problems:  * No active hospital problems. *    Discharged Condition: stable  Hospital Course: unremarkable except for pain control issues   Consults: none  Significant Diagnostic Studies: none  Treatments: open repair right quadriceps tendon  PT   Discharge Exam: Blood pressure 172/102, pulse 80, temperature 98.3 F (36.8 C), temperature source Oral, resp. rate 20, height 5\' 4"  (1.626 m), weight 105.688 kg (233 lb), SpO2 95.00%. General appearance: alert Incision/Wound:clean   Disposition: Home or Self Care  Discharge Orders    Future Appointments: Provider: Department: Dept Phone: Center:   09/14/2011 9:30 AM Arther Abbott, MD [facility] [phone] [facility]     Future Orders Please Complete By Expires   Diet - low sodium heart healthy      Call MD / Call 911      Comments:   If you experience chest pain or shortness of breath, CALL 911 and be transported to the hospital emergency room.  If you develope a fever above 101 F, pus (white drainage) or increased drainage or redness at the wound, or calf pain, call your surgeon's office.   Constipation Prevention      Comments:   Drink plenty of fluids.  Prune juice may be helpful.  You may use a stool softener, such as Colace (over the counter) 100 mg twice a day.  Use MiraLax (over the counter) for constipation as needed.   Increase activity slowly as tolerated      Weight Bearing as taught in Physical Therapy      Comments:   Use a walker or crutches as instructed.   Discharge instructions      Comments:   Keep brace on your knee   Do not bend the knee     Medication List  As of 07/25/2011  1:02 PM   START taking these medications         ibuprofen 800 MG tablet   Commonly known  as: ADVIL,MOTRIN   Take 1 tablet (800 mg total) by mouth every 8 (eight) hours as needed for pain.      methocarbamol 500 MG tablet   Commonly known as: ROBAXIN   Take 1 tablet (500 mg total) by mouth every 6 (six) hours as needed.      oxyCODONE 10 MG 12 hr tablet   Commonly known as: OXYCONTIN   Take 1 tablet (10 mg total) by mouth every 12 (twelve) hours.      oxyCODONE-acetaminophen 7.5-325 MG per tablet   Commonly known as: PERCOCET   Take 1 tablet by mouth every 4 (four) hours as needed for pain.         CONTINUE taking these medications         amLODipine 10 MG tablet   Commonly known as: NORVASC      B-12 PO      budesonide-formoterol 160-4.5 MCG/ACT inhaler   Commonly known as: SYMBICORT      CENTRUM SILVER ULTRA MENS Tabs      exenatide 10 MCG/0.04ML Soln   Commonly known as: BYETTA      fish oil-omega-3 fatty acids 1000 MG capsule      gabapentin 600 MG tablet   Commonly known as: NEURONTIN      HYDROcodone-acetaminophen 5-325 MG per tablet   Commonly  known as: NORCO   Take 1 tablet by mouth every 4 (four) hours as needed for pain.      insulin glargine 100 UNIT/ML injection   Commonly known as: LANTUS      insulin lispro 100 UNIT/ML injection   Commonly known as: HUMALOG      NASONEX 50 MCG/ACT nasal spray   Generic drug: mometasone      omeprazole 20 MG capsule   Commonly known as: PRILOSEC      PROVENTIL HFA 108 (90 BASE) MCG/ACT inhaler   Generic drug: albuterol      simvastatin 20 MG tablet   Commonly known as: ZOCOR          Where to get your medications    These are the prescriptions that you need to pick up.   You may get these medications from any pharmacy.         ibuprofen 800 MG tablet   methocarbamol 500 MG tablet   oxyCODONE 10 MG 12 hr tablet   oxyCODONE-acetaminophen 7.5-325 MG per tablet           staples out   Signed: Arther Abbott 07/25/2011, 1:02 PM

## 2011-07-25 NOTE — Progress Notes (Signed)
CARE MANAGEMENT NOTE 07/25/2011  Patient:  [name] [name], [name] [name]   Account Number:  [id number]  Date Initiated:  07/24/2011  Documentation initiated by:  Claretha Cooper  Subjective/Objective Assessment:   Pt admitted with R. Quadicep tenden rupture. PTA, pt was independent w/ ADLs and lived at home with spouse.     Action/Plan:   Pt had a surgical repair. Pt to discharge with outpatient physical therapy   Anticipated DC Date:  07/25/2011   Anticipated DC Plan:  [place]  CM consult      Choice offered to / List presented to:             Status of service:  Completed, signed off Medicare Important Message given?   (If response is "NO", the following Medicare IM given date fields will be blank) Date Medicare IM given:   Date Additional Medicare IM given:    Discharge Disposition:  HOME/SELF CARE  Per UR Regulation:    Comments:  07/25/11 [place] BSN CM  07/24/11 [place]

## 2011-07-25 NOTE — Progress Notes (Signed)
D/c instructions reviewed with patient and family.  Verbalized understanding.  Pt dc'd to home with wife.  Schonewitz, Eulis Canner  07/25/2011

## 2011-07-25 NOTE — Progress Notes (Signed)
Physical Therapy Treatment Patient Details Name: [name] [name] MRN: [id number] DOB: Nov 17, 1950 Today's Date: 07/25/2011 Time in/out:  14:04-14:28 Charges:  Gait 23'  PT Assessment/Plan  PT - Assessment/Plan Comments on Treatment Session: Pt. able to negotiate 1 flight of stairs with supervision using axillary crutches.  Little vc's required for proper sequencing.  Pt. independent with ambulation using crutches/knee immobilizer; pt. has met all goals with acute venue of therapy. Follow Up Recommendations: Outpatient PT PT Goals  Acute Rehab PT Goals PT Goal Formulation: With patient Time For Goal Achievement: 3 days Pt will go Supine/Side to Sit: with modified independence Pt will go Sit to Stand: Independently Pt will go Stand to Sit: Independently Pt will Stand: with modified independence Pt will Ambulate: >150 feet;with modified independence;with crutches;with other equipment (comment);Other (comment) (knee immobilizer) Pt will Go Up / Down Stairs: 1-2 stairs;with crutches;with supervision  PT Treatment Precautions/Restrictions  Precautions Required Braces or Orthoses: Yes Knee Immobilizer: On at all times Restrictions Weight Bearing Restrictions: No Other Position/Activity Restrictions: WBAT R with immobilizer on Mobility (including Balance) Transfers Transfers: Yes Sit to Stand: 6: Modified independent (Device/Increase time) Stand to Sit: 6: Modified independent (Device/Increase time) Ambulation/Gait Ambulation/Gait: Yes Ambulation/Gait Assistance: 6: Modified independent (Device/Increase time) Ambulation Distance (Feet): 250 Feet Assistive device: Crutches Stairs: Yes Stairs Assistance: 5: Supervision Stairs Assistance Details (indicate cue type and reason): Pt. able to complete 1 flight of stairs (10 steps) with minimal cues for sequencing/supervision using B axillary crutches. Stair Management Technique: With crutches;No rails Number of Stairs: 10  Height of  Stairs: 6     End of Session PT - End of Session Equipment Utilized During Treatment: Gait belt (axillary crutches) Activity Tolerance: Patient tolerated treatment well Patient left: in bed;with family/visitor present Nurse Communication:  (stair performance) General Behavior During Session: [facility] for tasks performed Cognition: [facility] for tasks performed  Roseanne Reno B 07/25/2011, 2:32 PM

## 2011-07-25 NOTE — Telephone Encounter (Signed)
Med sent, called wife, no answer

## 2011-07-25 NOTE — Telephone Encounter (Signed)
Flexeril 10 mg q.8 hours p.r.n. #60 one refill

## 2011-07-26 ENCOUNTER — Telehealth: Payer: Self-pay | Admitting: Orthopedic Surgery

## 2011-07-26 NOTE — Telephone Encounter (Signed)
This med was rejected by pharmacy as well

## 2011-07-26 NOTE — Telephone Encounter (Signed)
Patient called this morning following his discharge from [facility] 07/25/11, post op right knee surgery (quad tendon repair), done on 07/21/11.  Questions:   1. Patient states he tried to have his 2 prescriptions filled at his pharmacy, [facility], and was told that his insurance [facility]) requires prior authorization, which he said "the doctor needs to do."  We also received a faxed request from [facility] for 1 of the medications, Cyclopenzaprine.            Patient states he is needing both Oxycontin and Cyclobenzaprine.  Please advise.   2. Patient states his discharge paperwork indicates post op#1 appointment for 08/03/11.  Note in system does not indicate a date for the post op#1 visit.  Please confirm.   Patient ph# is [phone].

## 2011-07-26 NOTE — Telephone Encounter (Signed)
Patient states his discharge paperwork indicates post op #1 appointment for 08/03/11.  This is correct

## 2011-07-26 NOTE — Telephone Encounter (Signed)
Called patient this morning, 07/26/11, and scheduled accordingly.

## 2011-07-27 NOTE — Telephone Encounter (Signed)
Patient aware Baclofen sent in

## 2011-07-31 ENCOUNTER — Encounter (HOSPITAL_COMMUNITY): Payer: Self-pay | Admitting: Orthopedic Surgery

## 2011-08-03 ENCOUNTER — Encounter: Payer: Self-pay | Admitting: Orthopedic Surgery

## 2011-08-03 ENCOUNTER — Ambulatory Visit (INDEPENDENT_AMBULATORY_CARE_PROVIDER_SITE_OTHER): Payer: Medicare Other | Admitting: Orthopedic Surgery

## 2011-08-03 VITALS — BP 136/90 | Ht 64.0 in | Wt 233.0 lb

## 2011-08-03 DIAGNOSIS — S838X9A Sprain of other specified parts of unspecified knee, initial encounter: Secondary | ICD-10-CM

## 2011-08-03 DIAGNOSIS — S76119A Strain of unspecified quadriceps muscle, fascia and tendon, initial encounter: Secondary | ICD-10-CM | POA: Insufficient documentation

## 2011-08-03 MED ORDER — OXYCODONE-ACETAMINOPHEN 7.5-325 MG PO TABS: 1.0000 | ORAL_TABLET | ORAL | Status: AC | PRN

## 2011-08-03 NOTE — Patient Instructions (Signed)
Brace, keep leg straight

## 2011-08-03 NOTE — Progress Notes (Signed)
Patient ID: [name] [name], male   DOB: 1951/05/19, 61 y.o.   MRN: [id number]   Postoperative visit #1.  Postop day #13.  Date of surgery January 4.  Procedure repair RIGHT quadriceps tendon.  Weightbearing status as tolerated.  Current pain medication oxycodone.  Staples were removed. Wound is clean.  Patient placed back in long leg brace.  Brace for 6 weeks total. Then start hinge knee bracing and range of motion exercises.  Come back 2 weeks for refill prescription.

## 2011-08-17 ENCOUNTER — Encounter: Payer: Self-pay | Admitting: Orthopedic Surgery

## 2011-08-17 ENCOUNTER — Ambulatory Visit (INDEPENDENT_AMBULATORY_CARE_PROVIDER_SITE_OTHER): Payer: Medicare Other | Admitting: Orthopedic Surgery

## 2011-08-17 VITALS — Ht 64.0 in | Wt 233.0 lb

## 2011-08-17 DIAGNOSIS — IMO0002 Reserved for concepts with insufficient information to code with codable children: Secondary | ICD-10-CM

## 2011-08-17 DIAGNOSIS — S76119A Strain of unspecified quadriceps muscle, fascia and tendon, initial encounter: Secondary | ICD-10-CM

## 2011-08-17 MED ORDER — OXYCODONE-ACETAMINOPHEN 7.5-325 MG PO TABS: 1.0000 | ORAL_TABLET | ORAL | Status: DC | PRN

## 2011-08-17 NOTE — Patient Instructions (Signed)
START THERAPY IN TWO WEEKS CONTINUE WEARING BRACE

## 2011-08-17 NOTE — Progress Notes (Signed)
Patient ID: [name] [name], male   DOB: 23-Mar-1951, 61 y.o.   MRN: [id number]  Postop visit.  Date of surgery July 21, 2011  Procedure repair RIGHT quadriceps rupture  Diagnosis RIGHT quadriceps rupture  Complaints of pain, RIGHT knee  Exam incision is clean. Tendon is palpable and intact  Continue brace  Start therapy in 2 weeks   Refill  One month followup

## 2011-08-30 ENCOUNTER — Other Ambulatory Visit: Payer: Self-pay | Admitting: Orthopedic Surgery

## 2011-08-30 ENCOUNTER — Telehealth: Payer: Self-pay | Admitting: Orthopedic Surgery

## 2011-08-30 DIAGNOSIS — S76119A Strain of unspecified quadriceps muscle, fascia and tendon, initial encounter: Secondary | ICD-10-CM

## 2011-08-30 MED ORDER — OXYCODONE-ACETAMINOPHEN 7.5-325 MG PO TABS: 1.0000 | ORAL_TABLET | ORAL | Status: DC | PRN

## 2011-08-30 NOTE — Telephone Encounter (Signed)
[name] [name] asking if he can get  a refill on Oxycodone 7.5/325

## 2011-08-31 NOTE — Telephone Encounter (Signed)
Patient advised to pick up prescription.

## 2011-09-04 ENCOUNTER — Ambulatory Visit (HOSPITAL_COMMUNITY)
Admission: RE | Admit: 2011-09-04 | Discharge: 2011-09-04 | Disposition: A | Discharge status: Home / Self Care | Payer: Medicare Other | Source: Ambulatory Visit | Attending: Orthopedic Surgery | Admitting: Orthopedic Surgery

## 2011-09-04 DIAGNOSIS — R262 Difficulty in walking, not elsewhere classified: Secondary | ICD-10-CM | POA: Insufficient documentation

## 2011-09-04 DIAGNOSIS — M25669 Stiffness of unspecified knee, not elsewhere classified: Secondary | ICD-10-CM | POA: Insufficient documentation

## 2011-09-04 DIAGNOSIS — M6281 Muscle weakness (generalized): Secondary | ICD-10-CM | POA: Insufficient documentation

## 2011-09-04 DIAGNOSIS — E119 Type 2 diabetes mellitus without complications: Secondary | ICD-10-CM | POA: Insufficient documentation

## 2011-09-04 DIAGNOSIS — I10 Essential (primary) hypertension: Secondary | ICD-10-CM | POA: Insufficient documentation

## 2011-09-04 DIAGNOSIS — IMO0001 Reserved for inherently not codable concepts without codable children: Secondary | ICD-10-CM | POA: Insufficient documentation

## 2011-09-04 DIAGNOSIS — M25569 Pain in unspecified knee: Secondary | ICD-10-CM | POA: Insufficient documentation

## 2011-09-04 DIAGNOSIS — E785 Hyperlipidemia, unspecified: Secondary | ICD-10-CM | POA: Insufficient documentation

## 2011-09-04 NOTE — Patient Instructions (Addendum)
HEP

## 2011-09-04 NOTE — Evaluation (Signed)
Physical Therapy Evaluation  Patient Details  Name: [name] [name] MRN: [id number] Date of Birth: September 25, 1950  Today's Date: 09/04/2011 Time: 1120-1205 Time Calculation (min): 45 min  Visit#: 1  of 12   Re-eval: 10/04/11 Assessment Diagnosis: quadricep tendon repair Surgical Date: 07/21/11 Next MD Visit: 09/14/2011 Prior Therapy: none  Past Medical History:  Past Medical History  Diagnosis Date  . COPD (chronic obstructive pulmonary disease)   . Sinusitis   . PUD (peptic ulcer disease)     remote, reports f/u EGD about 8 years ago unremarkable   . Diabetes   . GERD (gastroesophageal reflux disease)   . Hyperlipidemia   . Hypertension   . Sleep apnea   . Post traumatic stress disorder   . Anxiety   . Depression   . Bell palsy    Past Surgical History:  Past Surgical History  Procedure Date  . Asad lt shoulder 12/2008    left shoulder  . Umbilical hernia repair [id number]    [place]  . Knee arthroscopy 10/2007    left  . Toenail excision     removed x2-bilateral  . Eye surgery 12/22/2010    tear duct probing[place]  . Foreign body removal 03/29/2011    Procedure: REMOVAL FOREIGN BODY EXTREMITY;  Surgeon: Arther Abbott, MD;  Location: [facility];  Service: Orthopedics;  Laterality: Right;  Removal Foreign Body Right Thumb  . Quadriceps tendon repair 07/21/2011    Procedure: REPAIR QUADRICEP TENDON;  Surgeon: Arther Abbott, MD;  Location: [facility];  Service: Orthopedics;  Laterality: Right;    Subjective Symptoms/Limitations Symptoms: Pt states that he slipped bending down to get a bucket and injured his knee.  He kept falling so he went to the ER on 07/10/11 and was diagnosed with a ruptured quadricep.  He was placed in an immobilizer and had surgery on January fourth.  The patient is now ambulating with a knee immobilizer and no assistive device her is being referred to outpatient therapy to improve his functional mobility. How long can you sit comfortably?: The patient  states that he is able to sit for fifteen to twenty minutes and then he needs to adjust. How long can you stand comfortably?: The patient states that he is able to stand for 20 to 30 minutes. How long can you walk comfortably?: The longest the patient has been able to walk is fifteen minutes. Special Tests: The patient states that he is waking up every night 2-3 times due to his knee. Pain Assessment Currently in Pain?: Yes Pain Score:   5 (worst 8/10; best 2/10 with pain meds ) Pain Location: Knee Pain Orientation: Right Pain Type: Surgical pain;Chronic pain Pain Onset: More than a month ago Pain Frequency: Intermittent Pain Relieving Factors: resting and pain meds Effect of Pain on Daily Activities: increases  Precautions/Restrictions  Precautions Required Braces or Orthoses:  (knee immobilzer when up )  Prior Functioning  Home Living Lives With: Spouse Receives Help From: Family Type of Home: House Home Layout: Laundry or work area in basement (wood stove in basement going down one step at a time.) Home Access: Stairs to enter Entrance Stairs-Rails: Right Entrance Stairs-Number of Steps:  (2) Prior Function Able to Take Stairs?: Reciprically Driving: Yes Vocation: On disability Leisure: Hobbies-yes (Comment) Comments: softball;   Cognition/Observation Cognition Overall Cognitive Status: Appears within functional limits for tasks assessed  Sensation/Coordination/Flexibility/Functional Tests Functional Tests Functional Tests: LEFS 20/80  Assessment RLE AROM (degrees) Right Knee Extension 0-130: 0  Right Knee Flexion  0-140: 95  RLE Strength Right Hip Flexion: 5/5 Right Hip Extension: 4/5 Right Hip ABduction: 5/5 Right Hip ADduction: 4/5 Right Knee Flexion: 4/5 Right Knee Extension: 3+/5 Right Ankle Dorsiflexion: 5/5 Right Ankle Plantar Flexion: 3-/5  Exercise/Treatments  Standing Heel Raises: 10 reps Seated Long Arc Quad: Right;10 reps Supine Heel  Slides: Right;10 reps Sidelying Hip ABduction: 10 reps Hip ADduction: 10 reps Prone  Hamstring Curl: 10 reps Hip Extension: 10 reps   Physical Therapy Assessment and Plan PT Assessment and Plan Clinical Impression Statement: Pt with stiffness, weakness, pain and difficulty walking following a tendon repair who will benefit from skilled PT to address problems and return pt to full functional potential. Rehab Potential: Good PT Frequency: Min 3X/week PT Duration: 4 weeks PT Treatment/Interventions: Gait training;Therapeutic activities;Therapeutic exercise PT Plan: Pt to be seen three times a week for four weeks to increase funcional mobilty.    Goals Home Exercise Program Pt will Perform Home Exercise Program: Independently PT Short Term Goals Time to Complete Short Term Goals: 2 weeks PT Short Term Goal 1: Pt pain to be no greater than a five PT Short Term Goal 2: Pt to be able to walk in his house without his knee immobilizer without feeling as if his knee is going to give way. PT Short Term Goal 3: Pt LEFS to improve by 7 levels PT Long Term Goals Time to Complete Long Term Goals: 4 weeks PT Long Term Goal 1: Pt pain to be no greater than a 2 to allow pt to sleep throughout the night without waking from knee pain. PT Long Term Goal 2: Pt to be able to walk without his knee immobilizer in the community for over an hour without feeling like his knee is going to give way. Long Term Goal 3: Pt LEFS to increase by 15 levels to improve quality of life. Long Term Goal 4: Pt to be able to sit for an hour and a half to watch a movie/ travel without difficulty. PT Long Term Goal 5: Pt to return to working out   Problem List Patient Active Problem List  Diagnoses  . DIABETES MELLITUS-TYPE II  . HYPERLIPIDEMIA  . HYPERTENSION  . GERD  . HEMATOCHEZIA  . SHOULDER, ARTHRITIS, DEGEN./OSTEO  . KNEE, ARTHRITIS, DEGEN./OSTEO  . DERANGEMENT MENISCUS  . SHOULDER PAIN  . BACK PAIN  .  TENDINITIS, CALCIFIC, SHOULDER, LEFT  . IMPINGEMENT SYNDROME  . RUPTURE ROTATOR CUFF  . OA (osteoarthritis) of knee  . Chronic pain  . CTS (carpal tunnel syndrome)  . Foreign body of thumb, right  . Quadriceps tendon rupture  . Rupture quadriceps tendon  . Difficulty in walking  . Stiffness of joint, not elsewhere classified, lower leg    PT - End of Session Activity Tolerance: Patient tolerated treatment well General Behavior During Session: [facility] for tasks performed Cognition: [facility] for tasks performed   Maurice Ramseur,CINDY 09/04/2011, 12:07 PM  Physician Documentation Your signature is required to indicate approval of the treatment plan as stated above.  Please sign and either send electronically or make a copy of this report for your files and return this physician signed original.   Please mark one 1.__approve of plan  2. ___approve of plan with the following conditions.   ______________________________  _____________________ Physician Signature                                                                                                             Date

## 2011-09-06 ENCOUNTER — Ambulatory Visit (HOSPITAL_COMMUNITY)
Admission: RE | Admit: 2011-09-06 | Discharge: 2011-09-06 | Disposition: A | Discharge status: Home / Self Care | Payer: Medicare Other | Source: Ambulatory Visit | Attending: Internal Medicine | Admitting: Internal Medicine

## 2011-09-06 NOTE — Progress Notes (Signed)
Physical Therapy Treatment Patient Details  Name: [name] [name] MRN: [id number] Date of Birth: 20-Mar-1951  Today's Date: 09/06/2011 Time: 1015-1105 Time Calculation (min): 50 min Visit#: 2  of 12   Re-eval: 10/04/11 Charges: Therex x 32' Ice x 10'  Subjective: Symptoms/Limitations Symptoms: I'm not having any pain right now. I took my pain pill about an hour ago. Pain Assessment Currently in Pain?: No/denies Pain Score: 0-No pain   Exercise/Treatments Standing Heel Raises: 15 reps Knee Flexion: 10 reps SLS: SLS 40" max of 3 Other Standing Knee Exercises: toes raises x 15 Seated Long Arc Quad: Right;10 reps Supine Quad Sets: 10 reps;Right Heel Slides: Right;10 reps Terminal Knee Extension: 10 reps Sidelying Hip ABduction: 15 reps Hip ADduction: 10 reps Prone  Hamstring Curl: 15 reps Hip Extension: 15 reps   Modalities Modalities: Cryotherapy Cryotherapy Number Minutes Cryotherapy: 10 Minutes Cryotherapy Location: Knee (Right) Type of Cryotherapy: Ice pack  Physical Therapy Assessment and Plan PT Assessment and Plan Clinical Impression Statement: Pt displays good quad contraction with quad sets. Pt tolerates tx well. Ice applied to R knee at end of session to limit inflammation/pain. PT Plan: Continue to progress per PT POC. Begin SLR next session.     Problem List Patient Active Problem List  Diagnoses  . DIABETES MELLITUS-TYPE II  . HYPERLIPIDEMIA  . HYPERTENSION  . GERD  . HEMATOCHEZIA  . SHOULDER, ARTHRITIS, DEGEN./OSTEO  . KNEE, ARTHRITIS, DEGEN./OSTEO  . DERANGEMENT MENISCUS  . SHOULDER PAIN  . BACK PAIN  . TENDINITIS, CALCIFIC, SHOULDER, LEFT  . IMPINGEMENT SYNDROME  . RUPTURE ROTATOR CUFF  . OA (osteoarthritis) of knee  . Chronic pain  . CTS (carpal tunnel syndrome)  . Foreign body of thumb, right  . Quadriceps tendon rupture  . Rupture quadriceps tendon  . Difficulty in walking  . Stiffness of joint, not elsewhere classified, lower  leg    PT - End of Session Activity Tolerance: Patient tolerated treatment well General Behavior During Session: [facility] for tasks performed Cognition: [facility] for tasks performed    Jonah Blue 09/06/2011, 11:10 AM

## 2011-09-07 ENCOUNTER — Inpatient Hospital Stay (HOSPITAL_COMMUNITY)
Admission: RE | Admit: 2011-09-07 | Discharge: 2011-09-07 | Payer: Medicare Other | Source: Ambulatory Visit | Attending: Physical Therapy | Admitting: Physical Therapy

## 2011-09-07 DIAGNOSIS — M25669 Stiffness of unspecified knee, not elsewhere classified: Secondary | ICD-10-CM

## 2011-09-07 DIAGNOSIS — R262 Difficulty in walking, not elsewhere classified: Secondary | ICD-10-CM

## 2011-09-08 ENCOUNTER — Ambulatory Visit (HOSPITAL_COMMUNITY)
Admission: RE | Admit: 2011-09-08 | Discharge: 2011-09-08 | Disposition: A | Discharge status: Home / Self Care | Payer: Medicare Other | Source: Ambulatory Visit | Attending: Physical Therapy | Admitting: Physical Therapy

## 2011-09-08 NOTE — Progress Notes (Signed)
Physical Therapy Treatment Patient Details  Name: [name] [name] MRN: [id number] Date of Birth: 02/02/1951  Today's Date: 09/08/2011 Time: [id number] Time Calculation (min): 31 min Charges: 50' TE Visit#: 3  of 12   Re-eval: 10/04/11   Subjective: Symptoms/Limitations Symptoms: Pt enters today without his immobolizer today, says he forgot it at home.  He reports that his pain is a 5/10 to his R knee.  Objective: ambulates with significant lumbar flexion and increased knee hyperextension.  Pain Assessment Currently in Pain?: Yes Pain Score:   5 Pain Location: Knee  Exercise/Treatments  Stretches Quad Stretch: 3 reps;30 seconds Aerobic Stationary Bike: 6 min, 1.0; last 4 minutes full ROM Seated Long Arc Quad: Right;10 reps;Weights Long Arc Quad Weight: 2 lbs. Supine Quad Sets: Right;10 reps;Limitations Quad Sets Limitations: 10 sec hold Short Arc Quad Sets: Left;10 reps Sidelying Hip ABduction: 10 reps;Limitations Hip ABduction Limitations: 2 lbs Hip ADduction: 10 reps Prone  Hamstring Curl: 10 reps Hip Extension: 15 reps Other Prone Exercises: Quad Set/TKR 10x5 sec hold    Physical Therapy Assessment and Plan PT Assessment and Plan Clinical Impression Statement: Pt tolerated all new exercises well.  He was able to demonstrate appropriate quad contraction and a SLR with a 5 degree knee lag.  He was able to perform all activities without an increase in pain. He was able to complete full revolutions on the bike for 4 minutes.  PT Plan: Cont to improve strength and ROM gently.  Add standing ther-ex if he has his brace next visit.     Problem List Patient Active Problem List  Diagnoses  . DIABETES MELLITUS-TYPE II  . HYPERLIPIDEMIA  . HYPERTENSION  . GERD  . HEMATOCHEZIA  . SHOULDER, ARTHRITIS, DEGEN./OSTEO  . KNEE, ARTHRITIS, DEGEN./OSTEO  . DERANGEMENT MENISCUS  . SHOULDER PAIN  . BACK PAIN  . TENDINITIS, CALCIFIC, SHOULDER, LEFT  . IMPINGEMENT SYNDROME  .  RUPTURE ROTATOR CUFF  . OA (osteoarthritis) of knee  . Chronic pain  . CTS (carpal tunnel syndrome)  . Foreign body of thumb, right  . Quadriceps tendon rupture  . Rupture quadriceps tendon  . Difficulty in walking  . Stiffness of joint, not elsewhere classified, lower leg       [name] [name] 09/08/2011, 10:26 AM

## 2011-09-11 ENCOUNTER — Ambulatory Visit (HOSPITAL_COMMUNITY)
Admission: RE | Admit: 2011-09-11 | Discharge: 2011-09-11 | Disposition: A | Discharge status: Home / Self Care | Payer: Medicare Other | Source: Ambulatory Visit | Attending: Internal Medicine | Admitting: Internal Medicine

## 2011-09-11 NOTE — Progress Notes (Signed)
Physical Therapy Treatment Patient Details  Name: [name] [name] MRN: [id number] Date of Birth: [birth year]  Today's Date: 09/11/2011 Time: 1020-1102 Time Calculation (min): 42 min Visit#: 4  of 12   Re-eval: 10/04/11 Diagnosis: quadricep tendon repair Surgical Date: 07/21/11 Next MD Visit: 09/14/2011 Charges:  therex 40'  Subjective: Symptoms/Limitations Symptoms: Pt. is wearing his KI today.  Pt. states he currently has no pain, but always takes his pain meds prior to therapy. Pain Assessment Currently in Pain?: No/denies  Precautions/Restrictions  Precautions Required Braces or Orthoses:  (KI on with WB activites/exercises)  Exercise/Treatments Stretches Sports administrator: 3 reps;30 seconds;Limitations Sports administrator Limitations: prone Aerobic Stationary Bike: 6' @ 3.0, seat 9 full revolutions Standing Heel Raises: 15 reps SLS: SLS 25" max of 3 Other Standing Knee Exercises: toes raises x 15 Supine Quad Sets Limitations: HEP Short Arc Quad Sets: 15 reps;Limitations Short Arc Quad Sets Limitations: 3# Straight Leg Raises: 2 sets;10 reps;Limitations Straight Leg Raises Limitations: concentrate on keeping knee straight Sidelying Hip ABduction: 15 reps Hip ABduction Limitations: 3# Hip ADduction: 15 reps;Limitations Hip ADduction Limitations: 3# Prone  Hamstring Curl: 15 reps Hip Extension: 20 reps     Physical Therapy Assessment and Plan PT Assessment and Plan Clinical Impression Statement: Pt. able to perform SLR without ext lag, however fatigues quickly and begins to lag after 3-4 reps.  Able to add/increase weight to 3# with most exercises and achieve 110 degrees flexion with prone quad stretch. PT Plan: Returns to MD next week; needs short progress note at next visit (tomorrow morning).  Add 3# weights to prone exercises next visit.     Problem List Patient Active Problem List  Diagnoses  . DIABETES MELLITUS-TYPE II  . HYPERLIPIDEMIA  . HYPERTENSION  . GERD    . HEMATOCHEZIA  . SHOULDER, ARTHRITIS, DEGEN./OSTEO  . KNEE, ARTHRITIS, DEGEN./OSTEO  . DERANGEMENT MENISCUS  . SHOULDER PAIN  . BACK PAIN  . TENDINITIS, CALCIFIC, SHOULDER, LEFT  . IMPINGEMENT SYNDROME  . RUPTURE ROTATOR CUFF  . OA (osteoarthritis) of knee  . Chronic pain  . CTS (carpal tunnel syndrome)  . Foreign body of thumb, right  . Quadriceps tendon rupture  . Rupture quadriceps tendon  . Difficulty in walking  . Stiffness of joint, not elsewhere classified, lower leg    PT - End of Session Activity Tolerance: Patient tolerated treatment well General Behavior During Session: [facility] for tasks performed Cognition: [facility] for tasks performed  GP No functional reporting required  Scorpio Fortin B. Mare Ferrari, PTA 09/11/2011, 11:08 AM

## 2011-09-12 ENCOUNTER — Ambulatory Visit (HOSPITAL_COMMUNITY)
Admission: RE | Admit: 2011-09-12 | Discharge: 2011-09-12 | Disposition: A | Discharge status: Home / Self Care | Payer: Medicare Other | Source: Ambulatory Visit | Attending: Internal Medicine | Admitting: Internal Medicine

## 2011-09-12 NOTE — Progress Notes (Signed)
Physical Therapy Treatment  Patient Details  Name: [name] [name] MRN: [id number] Date of Birth: Dec 08, 1950  Today's Date: 09/12/2011 Time: [id number] Time Calculation (min): 40 min Visit#: 5  of 12   Re-eval: 10/02/11 Charges:  therex 34'    Subjective Symptoms/Limitations Symptoms: Pt. reports having pain this morning; waited too late to take his pain meds and hasnt kicked in yet.  5/10 today. Pain Assessment Currently in Pain?: Yes Pain Score:   5 Pain Location: Knee Pain Orientation: Right  Precautions/Restrictions  Precautions Required Braces or Orthoses:  (KI with [place] activities)   Exercise/Treatments Stretches Sports administrator: 3 reps;30 seconds;Limitations Sports administrator Limitations: prone Aerobic Stationary Bike: 6' @ 3.0, seat 9 full revolutions Standing Heel Raises: 20 reps SLS: SLS max 20" Other Standing Knee Exercises: toes raises x 20 Seated Long Arc Quad: 15 reps Long Arc Quad Weight: 3 lbs. Supine Short Arc Quad Sets: 15 reps;Limitations Short Arc Quad Sets Limitations: 3# Straight Leg Raises: 2 sets;15 reps;Limitations Straight Leg Raises Limitations: concentrate on full extension Sidelying Hip ABduction: 15 reps Hip ABduction Limitations: 3# Hip ADduction: 15 reps;Limitations Hip ADduction Limitations: 3# Prone  Hamstring Curl: 15 reps;Limitations Hamstring Curl Limitations: 3# Hip Extension: 15 reps;Limitations Hip Extension Limitations: 3#    Physical Therapy Assessment and Plan PT Assessment and Plan Clinical Impression Statement: Pt. able to perform SLR without ext lag X 4-5 reps then fatigues.  Able to add 3# weight to prone exercises without difficulty.  Pt. has only met HEP goal at this point but progressing towards all other goals.  Pt. returns to MD this week. PT Plan: Continue X remaining 3 weeks and progress toward goals.  Begin step ups next visit.  Goals Home Exercise Program Pt will Perform Home Exercise Program:  Independently PT Goal: Perform Home Exercise Program - Progress: Met PT Short Term Goals Time to Complete Short Term Goals: 2 weeks PT Short Term Goal 1: Pt pain to be no greater than a five PT Short Term Goal 1 - Progress: Not met (higher with exercise and walking alot or standing still ) PT Short Term Goal 2: Pt to be able to walk in his house without his knee immobilizer without feeling as if his knee is going to give way. PT Short Term Goal 2 - Progress: Not met PT Short Term Goal 3: Pt LEFS to improve by 7 levels PT Short Term Goal 3 - Progress: Not met PT Long Term Goals Time to Complete Long Term Goals: 4 weeks PT Long Term Goal 1: Pt pain to be no greater than a 2 to allow pt to sleep throughout the night without waking from knee pain. PT Long Term Goal 1 - Progress: Not met PT Long Term Goal 2: Pt to be able to walk without his knee immobilizer in the community for over an hour without feeling like his knee is going to give way. PT Long Term Goal 2 - Progress: Not met Long Term Goal 3: Pt LEFS to increase by 15 levels to improve quality of life. Long Term Goal 3 Progress: Not met Long Term Goal 4 Progress: Not met Long Term Goal 5 Progress: Not met  Problem List Patient Active Problem List  Diagnoses  . DIABETES MELLITUS-TYPE II  . HYPERLIPIDEMIA  . HYPERTENSION  . GERD  . HEMATOCHEZIA  . SHOULDER, ARTHRITIS, DEGEN./OSTEO  . KNEE, ARTHRITIS, DEGEN./OSTEO  . DERANGEMENT MENISCUS  . SHOULDER PAIN  . BACK PAIN  . TENDINITIS, CALCIFIC, SHOULDER, LEFT  .  IMPINGEMENT SYNDROME  . RUPTURE ROTATOR CUFF  . OA (osteoarthritis) of knee  . Chronic pain  . CTS (carpal tunnel syndrome)  . Foreign body of thumb, right  . Quadriceps tendon rupture  . Rupture quadriceps tendon  . Difficulty in walking  . Stiffness of joint, not elsewhere classified, lower leg    PT - End of Session Activity Tolerance: Patient tolerated treatment well General Behavior During Session: [facility] for  tasks performed Cognition: [facility] for tasks performed    Ashwini Jago B. Mare Ferrari, PTA 09/12/2011, 8:46 AM

## 2011-09-14 ENCOUNTER — Ambulatory Visit (HOSPITAL_COMMUNITY)
Admission: RE | Admit: 2011-09-14 | Discharge: 2011-09-14 | Disposition: A | Discharge status: Home / Self Care | Payer: Medicare Other | Source: Ambulatory Visit | Attending: Internal Medicine | Admitting: Internal Medicine

## 2011-09-14 ENCOUNTER — Ambulatory Visit: Payer: Medicare PPO | Admitting: Orthopedic Surgery

## 2011-09-14 ENCOUNTER — Ambulatory Visit (INDEPENDENT_AMBULATORY_CARE_PROVIDER_SITE_OTHER): Payer: Medicare Other | Admitting: Orthopedic Surgery

## 2011-09-14 VITALS — Ht 64.0 in | Wt 233.0 lb

## 2011-09-14 DIAGNOSIS — S76119A Strain of unspecified quadriceps muscle, fascia and tendon, initial encounter: Secondary | ICD-10-CM

## 2011-09-14 DIAGNOSIS — IMO0002 Reserved for concepts with insufficient information to code with codable children: Secondary | ICD-10-CM

## 2011-09-14 MED ORDER — OXYCODONE-ACETAMINOPHEN 10-325 MG PO TABS: 1.0000 | ORAL_TABLET | ORAL | Status: AC | PRN

## 2011-09-14 MED ORDER — OXYCODONE-ACETAMINOPHEN 5-325 MG PO TABS: 1.0000 | ORAL_TABLET | ORAL | Status: AC | PRN

## 2011-09-14 NOTE — Progress Notes (Signed)
Patient ID: [name] [name], male   DOB: [birth year], 61 y.o.   MRN: [id number] Chief Complaint  Patient presents with  . Follow-up    1 month recheck on right quad.   Date of surgery July 21, 2011  Procedure repair RIGHT quadriceps rupture  Diagnosis RIGHT quadriceps rupture   The patient is doing well in terms of his rehabilitation or a graft he still complains of aching pain at times which is only partially relieved by Percocet 7.5  I informed him that we have to taper his medication;  Percocet 5 mg 2 concurrent prescriptions to last 14 days with 90 tablets each time.

## 2011-09-14 NOTE — Progress Notes (Signed)
Physical Therapy Treatment Patient Details  Name: [name] [name] MRN: [id number] Date of Birth: 05/18/1951  Today's Date: 09/14/2011 Time: [id number] Time Calculation (min): 66 min Visit#: 6  of 12   Re-eval: 10/02/11 Charges:  Therex 40', Estim unatten. X 1 unit, icepack X 1 unit    Subjective: Symptoms/Limitations Symptoms: Pt. states his pain is up again today; states his pain meds aren't helping anymore.  5/10 today. Pain Assessment Currently in Pain?: Yes Pain Score:   5 Pain Location: Knee Pain Orientation: Right   Exercise/Treatments Stretches Quad Stretch: 3 reps;30 seconds;Limitations Quad Stretch Limitations: prone Aerobic Stationary Bike: 6' @ 3.0, seat 8 full revolutions Standing Heel Raises: 20 reps Lateral Step Up: 10 reps;Step Height: 2";Hand Hold: 1;Right Forward Step Up: 10 reps;Step Height: 2";Hand Hold: 1;Right SLS: SLS max 20" Other Standing Knee Exercises: toes raises x 20 Seated Long Arc Quad: 20 reps Long Arc Quad Weight: 3 lbs. Supine Straight Leg Raises: 2 sets;10 reps;Limitations Straight Leg Raises Limitations: 3# Sidelying Hip ABduction: 20 reps Hip ABduction Limitations: 3# Hip ADduction: 20 reps Hip ADduction Limitations: 3# Prone  Hamstring Curl: 20 reps Hamstring Curl Limitations: 3# Hip Extension: 20 reps Hip Extension Limitations: 3#   Modalities Modalities: Electrical Stimulation;Cryotherapy Cryotherapy Number Minutes Cryotherapy: 15 Minutes Cryotherapy Location: Knee Type of Cryotherapy: Ice pack Gaffer Location: R knee Printmaker Action: To decrease pain Electrical Stimulation Parameters: IFES, hi/lo sweep 13Volts Electrical Stimulation Goals: Pain  Physical Therapy Assessment and Plan PT Assessment and Plan Clinical Impression Statement: Pt. able to complete step ups using 2" step; Added IFES/ice at end of session secondary to increase pain X last 2 visits.  pt.  reported overall pain reduction at end of session. PT Plan: Continue POC     Problem List Patient Active Problem List  Diagnoses  . DIABETES MELLITUS-TYPE II  . HYPERLIPIDEMIA  . HYPERTENSION  . GERD  . HEMATOCHEZIA  . SHOULDER, ARTHRITIS, DEGEN./OSTEO  . KNEE, ARTHRITIS, DEGEN./OSTEO  . DERANGEMENT MENISCUS  . SHOULDER PAIN  . BACK PAIN  . TENDINITIS, CALCIFIC, SHOULDER, LEFT  . IMPINGEMENT SYNDROME  . RUPTURE ROTATOR CUFF  . OA (osteoarthritis) of knee  . Chronic pain  . CTS (carpal tunnel syndrome)  . Foreign body of thumb, right  . Quadriceps tendon rupture  . Rupture quadriceps tendon  . Difficulty in walking  . Stiffness of joint, not elsewhere classified, lower leg    PT - End of Session Activity Tolerance: Patient tolerated treatment well General Behavior During Session: [facility] for tasks performed Cognition: [facility] for tasks performed    Syanna Remmert B. Mare Ferrari, PTA 09/14/2011, 12:13 PM

## 2011-09-14 NOTE — Patient Instructions (Signed)
Continue PT and brace x 4 weeks

## 2011-09-18 ENCOUNTER — Ambulatory Visit (HOSPITAL_COMMUNITY)
Admission: RE | Admit: 2011-09-18 | Discharge: 2011-09-18 | Disposition: A | Discharge status: Home / Self Care | Payer: Medicare Other | Source: Ambulatory Visit | Attending: Internal Medicine | Admitting: Internal Medicine

## 2011-09-18 DIAGNOSIS — M6281 Muscle weakness (generalized): Secondary | ICD-10-CM | POA: Insufficient documentation

## 2011-09-18 DIAGNOSIS — M25669 Stiffness of unspecified knee, not elsewhere classified: Secondary | ICD-10-CM | POA: Insufficient documentation

## 2011-09-18 DIAGNOSIS — E119 Type 2 diabetes mellitus without complications: Secondary | ICD-10-CM | POA: Insufficient documentation

## 2011-09-18 DIAGNOSIS — I10 Essential (primary) hypertension: Secondary | ICD-10-CM | POA: Insufficient documentation

## 2011-09-18 DIAGNOSIS — M25569 Pain in unspecified knee: Secondary | ICD-10-CM | POA: Insufficient documentation

## 2011-09-18 DIAGNOSIS — IMO0001 Reserved for inherently not codable concepts without codable children: Secondary | ICD-10-CM | POA: Insufficient documentation

## 2011-09-18 DIAGNOSIS — E785 Hyperlipidemia, unspecified: Secondary | ICD-10-CM | POA: Insufficient documentation

## 2011-09-18 NOTE — Progress Notes (Signed)
Physical Therapy Treatment Patient Details  Name: [name] [name] MRN: [id number] Date of Birth: 10/23/1950  Today's Date: 09/18/2011 Time: 1105-1200 Time Calculation (min): 55 min Visit#: 7  of 12   Re-eval: 10/02/11 Charges:  therex 73', estim unattended 15', icepack 15'  Subjective: Symptoms/Limitations Symptoms: PT. reports MD was pleased; States he has not taking his pain meds today.   Exercise/Treatments Aerobic Stationary Bike: 8' @ 3.0, seat 8 full revolutions Standing Heel Raises: 20 reps Knee Flexion: 20 reps Lateral Step Up: 10 reps;Hand Hold: 1;Right;Step Height: 4" Forward Step Up: 10 reps;Hand Hold: 1;Right;Step Height: 4" Step Down: 10 reps;Step Height: 2";Hand Hold: 1 SLS: SLS max 40" Other Standing Knee Exercises: toes raises x 20 Supine Short Arc Quad Sets: 15 reps Short Arc Quad Sets Limitations: 4# Straight Leg Raises: 2 sets;10 reps;Limitations Straight Leg Raises Limitations: 4# Sidelying Hip ABduction: 15 reps Hip ABduction Limitations: 4# Hip ADduction: 15 reps Hip ADduction Limitations: 4# Prone  Hamstring Curl: 15 reps Hamstring Curl Limitations: 4# Hip Extension: 15 reps Hip Extension Limitations: 4#   Modalities Modalities: Cryotherapy;Electrical Stimulation Cryotherapy Number Minutes Cryotherapy: 15 Minutes Type of Cryotherapy: Ice pack Gaffer Location: R knee with elevation Electrical Stimulation Action: to decrease pain Electrical Stimulation Parameters: IFES hi/lo sweep 18Volts  Physical Therapy Assessment and Plan PT Assessment and Plan Clinical Impression Statement: Able to increase to 4" step and begin forward step downs using 2" step.  Increased to 4# weights with therex today without difficulty.  Pt. reported he was going to return to the [facility]; instructed to only work on [place]; needs to clear with MD prior to working LE's.  PT Plan: Begin wallslides next visit; progress wt/reps as able.       Problem List Patient Active Problem List  Diagnoses  . DIABETES MELLITUS-TYPE II  . HYPERLIPIDEMIA  . HYPERTENSION  . GERD  . HEMATOCHEZIA  . SHOULDER, ARTHRITIS, DEGEN./OSTEO  . KNEE, ARTHRITIS, DEGEN./OSTEO  . DERANGEMENT MENISCUS  . SHOULDER PAIN  . BACK PAIN  . TENDINITIS, CALCIFIC, SHOULDER, LEFT  . IMPINGEMENT SYNDROME  . RUPTURE ROTATOR CUFF  . OA (osteoarthritis) of knee  . Chronic pain  . CTS (carpal tunnel syndrome)  . Foreign body of thumb, right  . Quadriceps tendon rupture  . Rupture quadriceps tendon  . Difficulty in walking  . Stiffness of joint, not elsewhere classified, lower leg    PT - End of Session Activity Tolerance: Patient tolerated treatment well General Behavior During Session: [facility] for tasks performed Cognition: [facility] for tasks performed   Shakeyla Giebler B. Mare Ferrari, PTA 09/18/2011, 11:52 AM

## 2011-09-20 ENCOUNTER — Ambulatory Visit (HOSPITAL_COMMUNITY)
Admission: RE | Admit: 2011-09-20 | Discharge: 2011-09-20 | Disposition: A | Discharge status: Home / Self Care | Payer: Medicare Other | Source: Ambulatory Visit | Attending: Internal Medicine | Admitting: Internal Medicine

## 2011-09-20 DIAGNOSIS — M25669 Stiffness of unspecified knee, not elsewhere classified: Secondary | ICD-10-CM

## 2011-09-20 DIAGNOSIS — R262 Difficulty in walking, not elsewhere classified: Secondary | ICD-10-CM

## 2011-09-20 NOTE — Progress Notes (Signed)
Physical Therapy Treatment Patient Details  Name: [name] [name] MRN: [id number] Date of Birth: 15-Dec-1950  Today's Date: 09/20/2011 Time: 1103-1208 Time Calculation (min): 65 min Visit#: 8  of 12   Re-eval: 10/02/11 Charges: Therex x 35' IFES w/ ice x 15'  Subjective: Symptoms/Limitations Symptoms: No pain meds prior this session,  pain scale 5/10. Pain Assessment Currently in Pain?: Yes Pain Score:   5 Pain Location: Knee Pain Orientation: Right   Exercise/Treatments Aerobic Stationary Bike: 8' @ 4.0, seat 8 full revolutions Standing Lateral Step Up: 10 reps;Hand Hold: 1;Right;Step Height: 4" Forward Step Up: 10 reps;Hand Hold: 1;Right;Step Height: 4" Step Down: 10 reps;Hand Hold: 1;Step Height: 4" Functional Squat: 10 reps SLS: SLS max 28" Seated Long Arc Quad: 20 reps Long Arc Quad Weight: 4 lbs. Supine Short Arc Quad Sets: 20 reps Short Arc Quad Sets Limitations: 4# Straight Leg Raises: 2 sets;10 reps;Limitations Straight Leg Raises Limitations: 4# Sidelying Hip ABduction: 20 reps Hip ABduction Limitations: 4# Hip ADduction: 15 reps Hip ADduction Limitations: 4# Prone  Hamstring Curl: 20 reps Hamstring Curl Limitations: 4# Hip Extension: 15 reps Hip Extension Limitations: 4#   Modalities Modalities: Cryotherapy Cryotherapy Number Minutes Cryotherapy: 15 Minutes (w/IFES) Cryotherapy Location: Knee (Right) Type of Cryotherapy: Ice pack Gaffer Location: R knee with elevation  Electrical Stimulation Action: to decrease pain Electrical Stimulation Parameters: IFES hi/lo sweep 18Volts  Electrical Stimulation Goals: Pain  Physical Therapy Assessment and Plan PT Assessment and Plan Clinical Impression Statement: Pt completes therex with minimal difficulty. Progressed forward step downs to 4" step to improve eccentric quad control. Estim and ice applied to R knee at end of session to decrease pain. PT Plan: Continue to  progress per PT POC. Begin wall slides next session.     Problem List Patient Active Problem List  Diagnoses  . DIABETES MELLITUS-TYPE II  . HYPERLIPIDEMIA  . HYPERTENSION  . GERD  . HEMATOCHEZIA  . SHOULDER, ARTHRITIS, DEGEN./OSTEO  . KNEE, ARTHRITIS, DEGEN./OSTEO  . DERANGEMENT MENISCUS  . SHOULDER PAIN  . BACK PAIN  . TENDINITIS, CALCIFIC, SHOULDER, LEFT  . IMPINGEMENT SYNDROME  . RUPTURE ROTATOR CUFF  . OA (osteoarthritis) of knee  . Chronic pain  . CTS (carpal tunnel syndrome)  . Foreign body of thumb, right  . Quadriceps tendon rupture  . Rupture quadriceps tendon  . Difficulty in walking  . Stiffness of joint, not elsewhere classified, lower leg    PT - End of Session Activity Tolerance: Patient tolerated treatment well General Behavior During Session: [facility] for tasks performed Cognition: [facility] for tasks performed   Jonah Blue 09/20/2011, 12:12 PM

## 2011-09-22 ENCOUNTER — Ambulatory Visit (HOSPITAL_COMMUNITY)
Admission: RE | Admit: 2011-09-22 | Discharge: 2011-09-22 | Disposition: A | Discharge status: Home / Self Care | Payer: Medicare Other | Source: Ambulatory Visit | Attending: Internal Medicine | Admitting: Internal Medicine

## 2011-09-22 DIAGNOSIS — R262 Difficulty in walking, not elsewhere classified: Secondary | ICD-10-CM

## 2011-09-22 DIAGNOSIS — M25669 Stiffness of unspecified knee, not elsewhere classified: Secondary | ICD-10-CM

## 2011-09-22 NOTE — Progress Notes (Signed)
Physical Therapy Treatment Patient Details  Name: [name] [name] MRN: [id number] Date of Birth: February 13, 1951  Today's Date: 09/22/2011 Time: 1015-1110 Time Calculation (min): 55 min Charges: 56 TE, 1 ice Visit#: 9  of 12   Re-eval: 10/02/11    Subjective: Symptoms/Limitations Symptoms: I have just a little pain 2/10 my pain pill just kicked in. Pain Assessment Currently in Pain?: Yes Pain Score:   2 Pain Location: Knee Pain Orientation: Right Pain Type: Chronic pain Pain Onset: More than a month ago  Exercise/Treatments Aerobic Stationary Bike: 8' @ 4.0, seat 8 full revolutions Machines for Strengthening Cybex Knee Extension: 1 PL x10 w/RLE eccentric control Cybex Knee Flexion: 4 PL BLE x10 Standing Lateral Step Up: Right;Hand Hold: 1;Step Height: 4";20 reps Forward Step Up: Right;Hand Hold: 1;Step Height: 4";20 reps Step Down: 10 reps;Step Height: 4";Hand Hold: 1 Functional Squat: 15 reps SLS: SLS max 28" Seated Long Arc Quad: 20 reps Long Arc Quad Weight: 4 lbs. Stool Scoot - Round Trips: 2 RT on Carpet Supine Straight Leg Raises: 2 sets;Limitations;15 reps Straight Leg Raises Limitations: 4# Sidelying Hip ABduction: 20 reps Hip ABduction Limitations: 4# Hip ADduction: 15 reps Hip ADduction Limitations: 4# Prone  Hamstring Curl: 20 reps Hamstring Curl Limitations: 4# Hip Extension: 20 reps Hip Extension Limitations: 4#      Physical Therapy Assessment and Plan PT Assessment and Plan Clinical Impression Statement: Added cybex exercise to improve eccentric quad control.  Added wall slides, stool scoots and increased reps with SLR improve LE muscular endurance.   PT Plan: Cont to progress LE functional strength and ROM    Problem List Patient Active Problem List  Diagnoses  . DIABETES MELLITUS-TYPE II  . HYPERLIPIDEMIA  . HYPERTENSION  . GERD  . HEMATOCHEZIA  . SHOULDER, ARTHRITIS, DEGEN./OSTEO  . KNEE, ARTHRITIS, DEGEN./OSTEO  . DERANGEMENT MENISCUS   . SHOULDER PAIN  . BACK PAIN  . TENDINITIS, CALCIFIC, SHOULDER, LEFT  . IMPINGEMENT SYNDROME  . RUPTURE ROTATOR CUFF  . OA (osteoarthritis) of knee  . Chronic pain  . CTS (carpal tunnel syndrome)  . Foreign body of thumb, right  . Quadriceps tendon rupture  . Rupture quadriceps tendon  . Difficulty in walking  . Stiffness of joint, not elsewhere classified, lower leg       GP No functional reporting required  [name] 09/22/2011, 11:01 AM

## 2011-09-25 ENCOUNTER — Ambulatory Visit (HOSPITAL_COMMUNITY)
Admission: RE | Admit: 2011-09-25 | Discharge: 2011-09-25 | Disposition: A | Discharge status: Home / Self Care | Payer: Medicare Other | Source: Ambulatory Visit | Attending: Internal Medicine | Admitting: Internal Medicine

## 2011-09-25 NOTE — Progress Notes (Addendum)
Physical Therapy Treatment Patient Details  Name: [name] [name] MRN: [id number] Date of Birth: 03-04-1951  Today's Date: 09/25/2011 Time: [id number] (Pt started my Roseanne Reno, PTA 1105-1135))-1200 Time Calculation (min): 55 min Visit#: 10  of 12   Re-eval: 10/02/11 Charges: Therex x 35' Ice x 10'  Subjective: Symptoms/Limitations Symptoms: Pt. states both of his knees hurt this morning; states he was unable to sleep well last night due to the pain. Pain Assessment Currently in Pain?: Yes Pain Score:   6 Pain Location: Knee Pain Orientation: Right;Left   Exercise/Treatments Aerobic Stationary Bike: 8' @ 4.0, seat 8 full revolutions Machines for Strengthening Cybex Knee Extension: 1 PL 2x10 w/RLE eccentric control Cybex Knee Flexion: 4 PL BLE 2x10 Standing Lateral Step Up: 10 reps;Hand Hold: 0;Step Height: 6" Forward Step Up: 10 reps;Hand Hold: 0;Step Height: 6" Step Down: 10 reps;Step Height: 4";Hand Hold: 1 Functional Squat: 15 reps SLS: SLS max 37" Seated Long Arc Quad: 15 reps Long Arc Quad Weight: 5 lbs. Supine Straight Leg Raises: 2 sets;Limitations;15 reps Straight Leg Raises Limitations: 4# Sidelying Hip ABduction: 15 reps Hip ABduction Limitations: 5# Hip ADduction: 15 reps Hip ADduction Limitations: 4# Prone  Hamstring Curl: 15 reps Hamstring Curl Limitations: 5# Hip Extension: 15 reps Hip Extension Limitations: 4#   Modalities Modalities: Cryotherapy Cryotherapy Number Minutes Cryotherapy: 10 Minutes Cryotherapy Location: Knee (Right) Type of Cryotherapy: Ice pack  Physical Therapy Assessment and Plan PT Assessment and Plan Clinical Impression Statement: Pt has difficulty with 4" step down secondary to decreased eccentric quad control. Step height moved down to 2" until strength improves. Increased wt with hip ext, abd , and hamstring curl with minimal difficulty. Pt unable to tolerate increased wt with hip add .Pt reports that he would like to hold  stool scoots this tx as he felt that they were "too much for him right now". Ice applied at end of session to decrease pain and limit swelling. IFES not completed per pt request. Pt states he just wants ice today. Pt reports pain decrease to 0/10 at end fo session. PT Plan: Continue to progress per PT POC. Begin IFES again PRN.     Problem List Patient Active Problem List  Diagnoses  . DIABETES MELLITUS-TYPE II  . HYPERLIPIDEMIA  . HYPERTENSION  . GERD  . HEMATOCHEZIA  . SHOULDER, ARTHRITIS, DEGEN./OSTEO  . KNEE, ARTHRITIS, DEGEN./OSTEO  . DERANGEMENT MENISCUS  . SHOULDER PAIN  . BACK PAIN  . TENDINITIS, CALCIFIC, SHOULDER, LEFT  . IMPINGEMENT SYNDROME  . RUPTURE ROTATOR CUFF  . OA (osteoarthritis) of knee  . Chronic pain  . CTS (carpal tunnel syndrome)  . Foreign body of thumb, right  . Quadriceps tendon rupture  . Rupture quadriceps tendon  . Difficulty in walking  . Stiffness of joint, not elsewhere classified, lower leg    PT - End of Session Activity Tolerance: Patient tolerated treatment well General Behavior During Session: [facility] for tasks performed Cognition: [facility] for tasks performed    Rachelle Hora, PTA 09/25/2011, 12:03 PM

## 2011-09-27 ENCOUNTER — Ambulatory Visit (HOSPITAL_COMMUNITY)
Admission: RE | Admit: 2011-09-27 | Discharge: 2011-09-27 | Disposition: A | Discharge status: Home / Self Care | Payer: Medicare Other | Source: Ambulatory Visit | Attending: *Deleted | Admitting: *Deleted

## 2011-09-27 NOTE — Progress Notes (Signed)
Physical Therapy Treatment Patient Details  Name: [name] [name] MRN: [id number] Date of Birth: 25-May-1951  Today's Date: 09/27/2011 Time: [id number] Time Calculation (min): 62 min Visit#: 11  of 12   Re-eval: 10/02/11 Charges: Therex x 32' IFES w/ice x 15'  Subjective: Symptoms/Limitations Symptoms: Pt states that his pain has decreased. Pain Assessment Currently in Pain?: No/denies Pain Score:   3 Pain Location: Knee Pain Orientation: Right   Exercise/Treatments Aerobic Stationary Bike: 8' @ 4.0, seat 8 full revolutions Machines for Strengthening Cybex Knee Extension: 1.5 PL 2x10 w/RLE eccentric control Cybex Knee Flexion: 4.5 PL BLE 2x10 Standing Lateral Step Up: 15 reps;Right;Step Height: 4" Forward Step Up: 15 reps;Right;Step Height: 4";Hand Hold: 0 Step Down: 15 reps;Right;Step Height: 2" Functional Squat: 20 reps SLS: SLS max 48" Supine Straight Leg Raises: 2 sets;Limitations;15 reps Straight Leg Raises Limitations: 4# Sidelying Hip ADduction: 15 reps Hip ADduction Limitations: 4# Prone  Hamstring Curl: 20 reps Hamstring Curl Limitations: 5# Hip Extension: 20 reps Hip Extension Limitations: 4#   Modalities Modalities: Cryotherapy Cryotherapy Number Minutes Cryotherapy: 15 Minutes Cryotherapy Location: Knee (Right) Type of Cryotherapy: Ice pack Gaffer Location: R knee with elevation Electrical Stimulation Action: to decrease pain  Electrical Stimulation Parameters: IFES hi/lo sweep L 24 on dynatron 950 Electrical Stimulation Goals: Pain  Physical Therapy Assessment and Plan PT Assessment and Plan Clinical Impression Statement: Pt completes therex with increased ease this session. Pt is still unable to tolerate forward step down on 4" step secondary to decreased eccentric quad control. Pt displays improved stability with SLS. Pt reports pain decrease to 0/10 at end of session. PT Plan: Reassess next session.     Goals    Problem List Patient Active Problem List  Diagnoses  . DIABETES MELLITUS-TYPE II  . HYPERLIPIDEMIA  . HYPERTENSION  . GERD  . HEMATOCHEZIA  . SHOULDER, ARTHRITIS, DEGEN./OSTEO  . KNEE, ARTHRITIS, DEGEN./OSTEO  . DERANGEMENT MENISCUS  . SHOULDER PAIN  . BACK PAIN  . TENDINITIS, CALCIFIC, SHOULDER, LEFT  . IMPINGEMENT SYNDROME  . RUPTURE ROTATOR CUFF  . OA (osteoarthritis) of knee  . Chronic pain  . CTS (carpal tunnel syndrome)  . Foreign body of thumb, right  . Quadriceps tendon rupture  . Rupture quadriceps tendon  . Difficulty in walking  . Stiffness of joint, not elsewhere classified, lower leg    PT - End of Session Activity Tolerance: Patient tolerated treatment well General Behavior During Session: [facility] for tasks performed Cognition: [facility] for tasks performed   Rachelle Hora, PTA 09/27/2011, 12:09 PM

## 2011-09-28 ENCOUNTER — Ambulatory Visit: Payer: Medicare Other | Admitting: Orthopedic Surgery

## 2011-09-29 ENCOUNTER — Ambulatory Visit (HOSPITAL_COMMUNITY)
Admission: RE | Admit: 2011-09-29 | Discharge: 2011-09-29 | Disposition: A | Discharge status: Home / Self Care | Payer: Medicare Other | Source: Ambulatory Visit | Attending: Internal Medicine | Admitting: Internal Medicine

## 2011-09-29 NOTE — Evaluation (Signed)
Physical Therapy reassessment. Patient Details  Name: [name] [name] MRN: [id number] Date of Birth: August 25, 1950  Today's Date: 09/29/2011 Time: 1055-1200 Time Calculation (min): 65 min  Visit#: 12  of 24   Re-eval: 10/29/11 Assessment Diagnosis: R quadricep tendon repair Surgical Date: 07/21/11 Next MD Visit: 09/14/2011  Past Medical History:  Past Medical History  Diagnosis Date  . COPD (chronic obstructive pulmonary disease)   . Sinusitis   . PUD (peptic ulcer disease)     remote, reports f/u EGD about 8 years ago unremarkable   . Diabetes   . GERD (gastroesophageal reflux disease)   . Hyperlipidemia   . Hypertension   . Sleep apnea   . Post traumatic stress disorder   . Anxiety   . Depression   . Bell palsy    Past Surgical History:  Past Surgical History  Procedure Date  . Asad lt shoulder 12/2008    left shoulder  . Umbilical hernia repair [id number]    [place]  . Knee arthroscopy 10/2007    left  . Toenail excision     removed x2-bilateral  . Eye surgery 12/22/2010    tear duct probing-[place]  . Foreign body removal 03/29/2011    Procedure: REMOVAL FOREIGN BODY EXTREMITY;  Surgeon: Arther Abbott, MD;  Location: [facility];  Service: Orthopedics;  Laterality: Right;  Removal Foreign Body Right Thumb  . Quadriceps tendon repair 07/21/2011    Procedure: REPAIR QUADRICEP TENDON;  Surgeon: Arther Abbott, MD;  Location: [facility];  Service: Orthopedics;  Laterality: Right;    Subjective  I still don't feel fully confident in my knee.  Sensation/Coordination/Flexibility/Functional Tests Functional Tests Functional Tests: LEFS 47/80 (from 20/80)  Assessment RLE AROM (degrees) Right Knee Extension 0-130: 0  Right Knee Flexion 0-140:  (120 was 110) RLE Strength Right Hip Flexion: 5/5 Right Hip Extension: 4/5 (was 4/5) Right Hip ABduction: 5/5 Right Hip ADduction: 5/5 (was 4/5) Right Knee Flexion:  (4+/5 was 4/5; one time max rep L 74.5 R 61.) Right Knee Extension:  3/5 (able to do one time max instead of mm L 70#; R 18#) Right Ankle Dorsiflexion: 5/5 Right Ankle Plantar Flexion: 4/5 (was 3-/5)  Exercise/Treatments   Aerobic Stationary Bike: 8"''@ 4.0 Machines for Strengthening Cybex Knee Extension: max rep 2.0 Cybex Knee Flexion: max rep 6.5PL   Standing Heel Raises: 10 reps (right only)   Prone  Hamstring Curl: 15 reps Hamstring Curl Limitations: 8# Hip Extension: 15 reps Hip Extension Limitations: 5# Physical Therapy Assessment and Plan PT Assessment and Plan Clinical Impression Statement: Pt has improved well in hamstring/hip ext and adductor strength.  Quad still remains limited at 24% of L; hamstring 75% of left. Rehab Potential: Good PT Frequency: Min 3X/week PT Duration: 4 weeks PT Treatment/Interventions: Therapeutic exercise;Therapeutic activities PT Plan: continue with therapy with quad strengthening emphasis.  Lunges with long holds.  Goals Home Exercise Program Pt will Perform Home Exercise Program: Independently PT Short Term Goals PT Short Term Goal 1: Goal was for pain to be no higher than a 5 pt reports pain has increased as high as a 6 this week. PT Short Term Goal 1 - Progress: Progressing toward goal PT Short Term Goal 2: Pt is able to ambulate without his knee immobilizer.  He states that if he is walking a lot his knee will give away on him once a day.  Problem List Patient Active Problem List  Diagnoses  . DIABETES MELLITUS-TYPE II  . HYPERLIPIDEMIA  . HYPERTENSION  .  GERD  . HEMATOCHEZIA  . SHOULDER, ARTHRITIS, DEGEN./OSTEO  . KNEE, ARTHRITIS, DEGEN./OSTEO  . DERANGEMENT MENISCUS  . SHOULDER PAIN  . BACK PAIN  . TENDINITIS, CALCIFIC, SHOULDER, LEFT  . IMPINGEMENT SYNDROME  . RUPTURE ROTATOR CUFF  . OA (osteoarthritis) of knee  . Chronic pain  . CTS (carpal tunnel syndrome)  . Foreign body of thumb, right  . Quadriceps tendon rupture  . Rupture quadriceps tendon  . Difficulty in walking  .  Stiffness of joint, not elsewhere classified, lower leg    PT - End of Session Activity Tolerance: Patient tolerated treatment well General Behavior During Session: [facility] for tasks performed    Ronrico Dupin,CINDY 09/29/2011, 12:00 PM  Physician Documentation Your signature is required to indicate approval of the treatment plan as stated above.  Please sign and either send electronically or make a copy of this report for your files and return this physician signed original.   Please mark one 1.__approve of plan  2. ___approve of plan with the following conditions.   ______________________________                                                          _____________________ Physician Signature                                                                                                             Date

## 2011-09-29 NOTE — Patient Instructions (Addendum)
Pt to do 100 quad sets a day.

## 2011-10-02 ENCOUNTER — Ambulatory Visit (HOSPITAL_COMMUNITY)
Admission: RE | Admit: 2011-10-02 | Discharge: 2011-10-02 | Disposition: A | Discharge status: Home / Self Care | Payer: Medicare Other | Source: Ambulatory Visit | Attending: Internal Medicine | Admitting: Internal Medicine

## 2011-10-02 NOTE — Progress Notes (Signed)
Physical Therapy Treatment Patient Details  Name: [name] [name] MRN: [id number] Date of Birth: Jan 27, 1951  Today's Date: 10/02/2011 Time: [id number] Time Calculation (min): 51 min Visit#: 13  of 24   Re-eval: 10/29/11 Charges: Therex x 28' Ice w/estim x 15'  Subjective: Symptoms/Limitations Symptoms: I was sore all weekend. When I woke up this morning my pain was an 8/10. I took a pain pill and now it's down to a 4/10. Pain Assessment Currently in Pain?: Yes Pain Score:   4 Pain Location: Knee Pain Orientation: Right   Exercise/Treatments Aerobic Stationary Bike: 6'@4 .0 Machines for Strengthening Cybex Knee Extension:   1.5 PL 2x10 w/RLE eccentric control  Cybex Knee Flexion:  4.5 PL BLE 2x10  Standing Lateral Step Up: 15 reps;Right;Step Height: 4" Forward Step Up: 15 reps;Right;Step Height: 4";Hand Hold: 0 Step Down: 15 reps;Right;Step Height: 2" Functional Squat: 15 reps Seated Other Seated Knee Exercises: STS x 10 w/o UE assist Supine Straight Leg Raises: 2 sets;Limitations;15 reps Straight Leg Raises Limitations: 4# Sidelying Hip ADduction: 15 reps Hip ADduction Limitations: 4#   Modalities Modalities: Cryotherapy Cryotherapy Number Minutes Cryotherapy: 15 Minutes Cryotherapy Location: Knee (Right) Type of Cryotherapy: Ice pack Gaffer Location: R knee with elevation Electrical Stimulation Action: to decrease pain Electrical Stimulation Parameters: IFES hi/lo sweep L 29 on Dynatron 950  Electrical Stimulation Goals: Pain  Physical Therapy Assessment and Plan PT Assessment and Plan Clinical Impression Statement: Tx somewhat limited by soreness. Pt states that after doing the on rep max test last session that his soreness in his knee has increased. Pt denies using any ice over the weekend. Pt educated on the importance of icing kneed to decrease pain and inflammation. IFES and ice applied to R knee at end of session to  decrease pain. Pt reports 0/10 pain at end of session. PT Plan: Continue to progress per PT POC as pain/soreness allows.     Problem List Patient Active Problem List  Diagnoses  . DIABETES MELLITUS-TYPE II  . HYPERLIPIDEMIA  . HYPERTENSION  . GERD  . HEMATOCHEZIA  . SHOULDER, ARTHRITIS, DEGEN./OSTEO  . KNEE, ARTHRITIS, DEGEN./OSTEO  . DERANGEMENT MENISCUS  . SHOULDER PAIN  . BACK PAIN  . TENDINITIS, CALCIFIC, SHOULDER, LEFT  . IMPINGEMENT SYNDROME  . RUPTURE ROTATOR CUFF  . OA (osteoarthritis) of knee  . Chronic pain  . CTS (carpal tunnel syndrome)  . Foreign body of thumb, right  . Quadriceps tendon rupture  . Rupture quadriceps tendon  . Difficulty in walking  . Stiffness of joint, not elsewhere classified, lower leg    PT - End of Session Activity Tolerance: Patient tolerated treatment well;Patient limited by pain General Behavior During Session: [facility] for tasks performed Cognition: [facility] for tasks performed  Rachelle Hora, PTA 10/02/2011, 12:00 PM

## 2011-10-04 ENCOUNTER — Ambulatory Visit (HOSPITAL_COMMUNITY)
Admission: RE | Admit: 2011-10-04 | Discharge: 2011-10-04 | Disposition: A | Discharge status: Home / Self Care | Payer: Medicare Other | Source: Ambulatory Visit | Attending: Internal Medicine | Admitting: Internal Medicine

## 2011-10-04 NOTE — Progress Notes (Signed)
Physical Therapy Treatment Patient Details  Name: [name] [name] MRN: [id number] Date of Birth: 12/30/1950  Today's Date: 10/04/2011 Time: [id number] Time Calculation (min): 52 min Visit#: 14  of 24   Re-eval: 10/27/11 Charges:  therex 34', ice 10'    Subjective: Symptoms/Limitations Symptoms: No pain today Pain Assessment Currently in Pain?: No/denies   Exercise/Treatments Aerobic Stationary Bike: 8'@4 .0, seat 8 Machines for Strengthening Cybex Knee Extension:   2PL 10X, 1.5 PL 10X w/RLE eccentric control  Cybex Knee Flexion:  4.5 PL BLE 2x10  Standing Heel Raises: 10 reps;Limitations Heel Raises Limitations: R LE only Lateral Step Up: 10 reps;Step Height: 6";Hand Hold: 1 Forward Step Up: 10 reps;Step Height: 6";Hand Hold: 1 Step Down: 10 reps;Step Height: 4";Hand Hold: 1 Wall Squat: 10 reps;5 seconds SLS: SLS max 36" Seated Other Seated Knee Exercises: STS x 10 w/o UE assist Supine Straight Leg Raises: 2 sets;Limitations;15 reps Straight Leg Raises Limitations: 4# Sidelying Hip ABduction Limitations: D/C Hip ADduction Limitations: D/C Prone  Hamstring Curl Limitations: D/C Hip Extension Limitations: D/C   Modalities Modalities: Cryotherapy Cryotherapy Number Minutes Cryotherapy: 15 Minutes Cryotherapy Location: Knee Type of Cryotherapy: Ice pack  Physical Therapy Assessment and Plan PT Assessment and Plan Clinical Impression Statement: Able to increase step to 4" with forward step downs; 6" with lateral/fwd step ups.  Pt. also able to increase to 2PL on cybex quad, decreased to 1 set of 10 reps.  Increased difficulty of squats to wallslides with 5" holds.  May benefit from isokinetic quad training. PT Plan: Add isokinetic quad training if PT agrees, continue to progress.     Problem List Patient Active Problem List  Diagnoses  . DIABETES MELLITUS-TYPE II  . HYPERLIPIDEMIA  . HYPERTENSION  . GERD  . HEMATOCHEZIA  . SHOULDER, ARTHRITIS, DEGEN./OSTEO    . KNEE, ARTHRITIS, DEGEN./OSTEO  . DERANGEMENT MENISCUS  . SHOULDER PAIN  . BACK PAIN  . TENDINITIS, CALCIFIC, SHOULDER, LEFT  . IMPINGEMENT SYNDROME  . RUPTURE ROTATOR CUFF  . OA (osteoarthritis) of knee  . Chronic pain  . CTS (carpal tunnel syndrome)  . Foreign body of thumb, right  . Quadriceps tendon rupture  . Rupture quadriceps tendon  . Difficulty in walking  . Stiffness of joint, not elsewhere classified, lower leg    PT - End of Session Activity Tolerance: Patient tolerated treatment well General Behavior During Session: [facility] for tasks performed Cognition: [facility] for tasks performed   Dutch Ing B. Mare Ferrari, PTA 10/04/2011, 11:46 AM

## 2011-10-06 ENCOUNTER — Ambulatory Visit (HOSPITAL_COMMUNITY)
Admission: RE | Admit: 2011-10-06 | Discharge: 2011-10-06 | Disposition: A | Discharge status: Home / Self Care | Payer: Medicare Other | Source: Ambulatory Visit | Attending: Internal Medicine | Admitting: Internal Medicine

## 2011-10-06 NOTE — Progress Notes (Signed)
Physical Therapy Treatment Patient Details  Name: [name] [name] MRN: [id number] Date of Birth: 06-23-1951  Today's Date: 10/06/2011 Time: [id number] Time Calculation (min): 59 min Visit#: 15  of 24   Re-eval: 10/27/11 Charges:  therex 40', icepack 10'    Subjective: Symptoms/Limitations Symptoms: Pt. reports he's hurting a little more today.  4/10 pain. Pain Assessment Currently in Pain?: Yes Pain Score:   4 Pain Location: Knee Pain Orientation: Right   Exercise/Treatments Aerobic Stationary Bike: 8'@ 5.0, seat 8 Machines for Strengthening Cybex Knee Extension:   2PL 10X, 1.5 PL 10X w/RLE eccentric control  Cybex Knee Flexion:  4.5 PL BLE 2x10  Standing Lateral Step Up: 10 reps;Step Height: 4";Hand Hold: 1 Forward Step Up: 10 reps;Step Height: 4";Hand Hold: 1 Step Down: 10 reps;Step Height: 4";Hand Hold: 1 Wall Squat: 10 reps SLS: SLS 1 minute Rebounder: add next visit Seated Other Seated Knee Exercises: isokinetic biodex; 180-150-120/ up 10 reps each Supine Straight Leg Raises: 2 sets;Limitations;15 reps Straight Leg Raises Limitations: 5#   Physical Therapy Assessment and Plan PT Assessment and Plan Clinical Impression Statement: Went back down to 4" step for step ups/downs due to difficulty controlling eccentric phase of motion.  Added isokinetic training for R quadricep with good control/speed.  Pt. continues to have distal LE swelling in R LE; recommended compression stocking to assist with this. PT Plan: Continue to increase quad strength.     Problem List Patient Active Problem List  Diagnoses  . DIABETES MELLITUS-TYPE II  . HYPERLIPIDEMIA  . HYPERTENSION  . GERD  . HEMATOCHEZIA  . SHOULDER, ARTHRITIS, DEGEN./OSTEO  . KNEE, ARTHRITIS, DEGEN./OSTEO  . DERANGEMENT MENISCUS  . SHOULDER PAIN  . BACK PAIN  . TENDINITIS, CALCIFIC, SHOULDER, LEFT  . IMPINGEMENT SYNDROME  . RUPTURE ROTATOR CUFF  . OA (osteoarthritis) of knee  . Chronic pain  . CTS  (carpal tunnel syndrome)  . Foreign body of thumb, right  . Quadriceps tendon rupture  . Rupture quadriceps tendon  . Difficulty in walking  . Stiffness of joint, not elsewhere classified, lower leg    PT - End of Session Activity Tolerance: Patient tolerated treatment well General Behavior During Session: [facility] for tasks performed Cognition: [facility] for tasks performed    [name] B. Mare Ferrari, PTA 10/06/2011, 10:25 AM

## 2011-10-09 ENCOUNTER — Ambulatory Visit (HOSPITAL_COMMUNITY)
Admission: RE | Admit: 2011-10-09 | Discharge: 2011-10-09 | Disposition: A | Discharge status: Home / Self Care | Payer: Medicare Other | Source: Ambulatory Visit | Attending: Internal Medicine | Admitting: Internal Medicine

## 2011-10-09 NOTE — Progress Notes (Signed)
Physical Therapy Treatment Patient Details  Name: [name] [name] MRN: [id number] Date of Birth: 1951/03/03  Today's Date: 10/09/2011 Time: [id number] Time Calculation (min): 65 min Visit#: 16  of 24   Re-eval: 10/27/11 Charges:  therex 91', ice 10'    Subjective: Symptoms/Limitations Symptoms: Pt. states he has pain in both knees; 4/10 pain bilaterally Pain Assessment Currently in Pain?: Yes Pain Score:   4 Pain Location: Knee Pain Orientation: Right   Exercise/Treatments Aerobic Stationary Bike: 8'@ 5.0, seat 8 Machines for Strengthening Cybex Knee Extension:   2PL 10X, 1.5 PL 10X w/RLE eccentric control  Cybex Knee Flexion:  4.5 PL BLE 2x10  Standing Lateral Step Up: 15 reps;Step Height: 4";Hand Hold: 1 Forward Step Up: 15 reps;Step Height: 4";Hand Hold: 1 Step Down: 15 reps;Step Height: 4";Hand Hold: 1 Wall Squat: 15 reps;5 seconds Rebounder: red 15 reps R LE only Seated Other Seated Knee Exercises: isokinetic biodex; 180-150-120/ up 10 reps each Supine Straight Leg Raises: 2 sets;Limitations;15 reps Straight Leg Raises Limitations: 5#    Physical Therapy Assessment and Plan PT Assessment and Plan Clinical Impression Statement: Began rebounder to increase stablity.  Pt. unable to maintain SLS with all 15 reps, touching down 6 times.  Continues to have weak eccentric control of quad, improving slowly. PT Plan: Add elliptical next visit to work on quad strength.     Problem List Patient Active Problem List  Diagnoses  . DIABETES MELLITUS-TYPE II  . HYPERLIPIDEMIA  . HYPERTENSION  . GERD  . HEMATOCHEZIA  . SHOULDER, ARTHRITIS, DEGEN./OSTEO  . KNEE, ARTHRITIS, DEGEN./OSTEO  . DERANGEMENT MENISCUS  . SHOULDER PAIN  . BACK PAIN  . TENDINITIS, CALCIFIC, SHOULDER, LEFT  . IMPINGEMENT SYNDROME  . RUPTURE ROTATOR CUFF  . OA (osteoarthritis) of knee  . Chronic pain  . CTS (carpal tunnel syndrome)  . Foreign body of thumb, right  . Quadriceps tendon rupture    . Rupture quadriceps tendon  . Difficulty in walking  . Stiffness of joint, not elsewhere classified, lower leg    PT - End of Session Activity Tolerance: Patient tolerated treatment well General Behavior During Session: [facility] for tasks performed Cognition: [facility] for tasks performed   Trinadee Verhagen B. Mare Ferrari, PTA 10/09/2011, 11:37 AM

## 2011-10-11 ENCOUNTER — Ambulatory Visit (HOSPITAL_COMMUNITY)
Admission: RE | Admit: 2011-10-11 | Discharge: 2011-10-11 | Disposition: A | Discharge status: Home / Self Care | Payer: Medicare Other | Source: Ambulatory Visit | Attending: Internal Medicine | Admitting: Internal Medicine

## 2011-10-11 NOTE — Progress Notes (Signed)
Physical Therapy Treatment Patient Details  Name: YITZCHAK ALDAZ MRN: [id number] Date of Birth: [birth year]  Today's Date: 10/11/2011 Time: 1105-1202 Time Calculation (min): 57 min Visit#: 17  of 24   Re-eval: 10/27/11 Charges: Therex x 34' Ice x 10'  Subjective: Symptoms/Limitations Symptoms: Pt states that his knee continues to hurt. He does state that he did yardwork yesterday. Pain Assessment Currently in Pain?: Yes Pain Score:   4 Pain Location: Knee Pain Orientation: Right   Exercise/Treatments Aerobic Stationary Bike: 8'@ 5.0, seat 8 Machines for Strengthening Cybex Knee Extension: 1.5 PL 2x10 w/RLE eccentric control  Cybex Knee Flexion:  4.5 PL 2x10 R only Standing Lateral Step Up: 15 reps;Step Height: 4";Hand Hold: 1 Forward Step Up: 15 reps;Step Height: 4";Hand Hold: 1 Step Down: 15 reps;Step Height: 4";Hand Hold: 1 Wall Squat: 15 reps;5 seconds Rebounder: red 15 reps R LE only Seated Other Seated Knee Exercises: isokinetic biodex; 180-150-120-90 up 10 reps each   Modalities Modalities: Cryotherapy Cryotherapy Number Minutes Cryotherapy: 10 Minutes Cryotherapy Location: Knee (Right) Type of Cryotherapy: Ice pack  Physical Therapy Assessment and Plan PT Assessment and Plan Clinical Impression Statement: Pt somewhat limited by pain this session. Pt reports that pain decreased as he completed his exercises. Decreased wt with Cybex knee ext secondary to pain. Pt complete isokinetic biodex with minimal difficulty. Pt displays improved eccentric control with forward step downs. Pt reports pain decrease to 2/10 at end of session. PT Plan: Continue to progress per PT POC. Begin elliptical and increase resistance on biodex next session.     Problem List Patient Active Problem List  Diagnoses  . DIABETES MELLITUS-TYPE II  . HYPERLIPIDEMIA  . HYPERTENSION  . GERD  . HEMATOCHEZIA  . SHOULDER, ARTHRITIS, DEGEN./OSTEO  . KNEE, ARTHRITIS, DEGEN./OSTEO  .  DERANGEMENT MENISCUS  . SHOULDER PAIN  . BACK PAIN  . TENDINITIS, CALCIFIC, SHOULDER, LEFT  . IMPINGEMENT SYNDROME  . RUPTURE ROTATOR CUFF  . OA (osteoarthritis) of knee  . Chronic pain  . CTS (carpal tunnel syndrome)  . Foreign body of thumb, right  . Quadriceps tendon rupture  . Rupture quadriceps tendon  . Difficulty in walking  . Stiffness of joint, not elsewhere classified, lower leg    PT - End of Session Activity Tolerance: Patient tolerated treatment well General Behavior During Session: [facility] for tasks performed Cognition: [facility] for tasks performed   Rachelle Hora, PTA 10/11/2011, 12:06 PM

## 2011-10-12 ENCOUNTER — Encounter: Payer: Self-pay | Admitting: Orthopedic Surgery

## 2011-10-12 ENCOUNTER — Ambulatory Visit: Payer: Medicare Other | Admitting: Orthopedic Surgery

## 2011-10-12 ENCOUNTER — Ambulatory Visit (INDEPENDENT_AMBULATORY_CARE_PROVIDER_SITE_OTHER): Payer: Medicare Other | Admitting: Orthopedic Surgery

## 2011-10-12 VITALS — BP 126/70 | Ht 64.0 in | Wt 233.0 lb

## 2011-10-12 DIAGNOSIS — G8929 Other chronic pain: Secondary | ICD-10-CM

## 2011-10-12 MED ORDER — OXYCODONE-ACETAMINOPHEN 5-325 MG PO TABS: 1.0000 | ORAL_TABLET | ORAL | Status: AC | PRN

## 2011-10-12 NOTE — Patient Instructions (Signed)
Activity as tolerated

## 2011-10-12 NOTE — Progress Notes (Signed)
Patient ID: [name] [name], male   DOB: [birth year], 61 y.o.   MRN: [id number] Chief Complaint  Patient presents with  . Follow-up    4 week recheck on right quad.   Surgery date January 4  RIGHT quadriceps tendon repair He's doing well he is wihou assisive device his flexion arc is 125 he has full extension has a normal straight leg raise his incision healed nicely  He does have significant arthritic symptoms at this time.  He is using a topical cream which seems to help him.  I have decreased his oxycodone 5 mg.  He will transition to hydrocodone after his prescription runs out.

## 2011-10-13 ENCOUNTER — Ambulatory Visit (HOSPITAL_COMMUNITY)
Admission: RE | Admit: 2011-10-13 | Discharge: 2011-10-13 | Disposition: A | Discharge status: Home / Self Care | Payer: Medicare Other | Source: Ambulatory Visit | Attending: Internal Medicine | Admitting: Internal Medicine

## 2011-10-13 DIAGNOSIS — R262 Difficulty in walking, not elsewhere classified: Secondary | ICD-10-CM

## 2011-10-13 DIAGNOSIS — M25669 Stiffness of unspecified knee, not elsewhere classified: Secondary | ICD-10-CM

## 2011-10-13 NOTE — Progress Notes (Signed)
Physical Therapy Treatment Patient Details  Name: [name] [name] MRN: [id number] Date of Birth: [birth year]  Today's Date: 10/13/2011 Time: 1100-1148 Time Calculation (min): 48 min Charges: 69' TE, 1 ice Visit#: 18  of 24   Re-eval: 10/27/11    Subjective: Symptoms/Limitations Symptoms: My knee is doing pretty well.  Pain Assessment Currently in Pain?: Yes Pain Score:   1 Pain Location: Knee Pain Orientation: Right  Exercise/Treatments Aerobic Stationary Bike: 8'@ 5.0, seat 8 Machines for Strengthening Cybex Knee Extension: 1.5 PL 1x15, 1x10 w/RLE eccentric control  Cybex Knee Flexion: 4.5 2x15 R only Standing Lateral Step Up: 15 reps;Step Height: 4";Hand Hold: 1 Forward Step Up: 15 reps;Step Height: 4";Hand Hold: 1 Step Down: 15 reps;Step Height: 4";Hand Hold: 1 Wall Squat: 15 reps;5 seconds Rebounder: red 15 reps R LE only Seated Other Seated Knee Exercises: isokinetic biodex; 150-120-90-75 up 10 reps each   Modalities Modalities: Cryotherapy Cryotherapy Number Minutes Cryotherapy: 10 Minutes Cryotherapy Location: Knee  Physical Therapy Assessment and Plan PT Assessment and Plan Clinical Impression Statement: Pt had increased pain with knee extension exercise.  He tolerated increased biodex activities well.  He continues to demonstrate glute med weakness with stair activities (especially with step down activities) PT Plan: Cont to progess.  Cont with elliptical next visit    Goals    Problem List Patient Active Problem List  Diagnoses  . DIABETES MELLITUS-TYPE II  . HYPERLIPIDEMIA  . HYPERTENSION  . GERD  . HEMATOCHEZIA  . SHOULDER, ARTHRITIS, DEGEN./OSTEO  . KNEE, ARTHRITIS, DEGEN./OSTEO  . DERANGEMENT MENISCUS  . SHOULDER PAIN  . BACK PAIN  . TENDINITIS, CALCIFIC, SHOULDER, LEFT  . IMPINGEMENT SYNDROME  . RUPTURE ROTATOR CUFF  . OA (osteoarthritis) of knee  . Chronic pain  . CTS (carpal tunnel syndrome)  . Foreign body of thumb, right  .  Quadriceps tendon rupture  . Rupture quadriceps tendon  . Difficulty in walking  . Stiffness of joint, not elsewhere classified, lower leg   [name] [name] 10/13/2011, 11:55 AM

## 2011-10-16 ENCOUNTER — Ambulatory Visit (HOSPITAL_COMMUNITY)
Admission: RE | Admit: 2011-10-16 | Discharge: 2011-10-16 | Disposition: A | Discharge status: Home / Self Care | Payer: Medicare Other | Source: Ambulatory Visit | Attending: Internal Medicine | Admitting: Internal Medicine

## 2011-10-16 DIAGNOSIS — IMO0001 Reserved for inherently not codable concepts without codable children: Secondary | ICD-10-CM | POA: Insufficient documentation

## 2011-10-16 DIAGNOSIS — M6281 Muscle weakness (generalized): Secondary | ICD-10-CM | POA: Insufficient documentation

## 2011-10-16 DIAGNOSIS — M25569 Pain in unspecified knee: Secondary | ICD-10-CM | POA: Insufficient documentation

## 2011-10-16 DIAGNOSIS — M25669 Stiffness of unspecified knee, not elsewhere classified: Secondary | ICD-10-CM | POA: Insufficient documentation

## 2011-10-16 DIAGNOSIS — I10 Essential (primary) hypertension: Secondary | ICD-10-CM | POA: Insufficient documentation

## 2011-10-16 DIAGNOSIS — E785 Hyperlipidemia, unspecified: Secondary | ICD-10-CM | POA: Insufficient documentation

## 2011-10-16 DIAGNOSIS — E119 Type 2 diabetes mellitus without complications: Secondary | ICD-10-CM | POA: Insufficient documentation

## 2011-10-16 NOTE — Progress Notes (Signed)
Physical Therapy Treatment Patient Details  Name: [name] [name] MRN: [id number] Date of Birth: Mar 26, 1951  Today's Date: 10/16/2011 Time: [id number] Time Calculation (min): 53 min Visit#: 19  of 24   Re-eval: 10/27/11 Diagnosis: R quadricep tendon repair Surgical Date: 07/21/11 Charges:  Therex 38', ice 10'  Subjective: Symptoms/Limitations Symptoms: Pt. states the MD released him from his R knee/quad repair, however going back for his L knee stating he does not want a TKR.  Currently without pain Pain Assessment Currently in Pain?: No/denies   Exercises Instructed by Gloriann Loan, SPTA under the direction of Nathanial Arrighi Mare Ferrari, PTA/CI. Exercise/Treatments Aerobic Elliptical: 5' fwd Machines for Strengthening Cybex Knee Extension: 1.5 PL, 2X15 reps w/ R eccentric lowering Cybex Knee Flexion: 4.5 2x15 R only Standing Lateral Step Up: 15 reps;Step Height: 4";Hand Hold: 1 Forward Step Up: 15 reps;Step Height: 4";Hand Hold: 1 Step Down: 15 reps;Step Height: 4";Hand Hold: 1 Wall Squat: 15 reps;5 seconds Rebounder: red 15 reps R LE only Walking with Sports Cord: thickest cord, 5 reps fwd/bkwd/R/L Seated Other Seated Knee Exercises: isokinetic biodex; 150-120-90-75 up 10 reps each    Modalities Modalities: Cryotherapy Cryotherapy Number Minutes Cryotherapy: 10 Minutes Cryotherapy Location: Knee Type of Cryotherapy: Ice pack  Physical Therapy Assessment and Plan PT Assessment and Plan Clinical Impression Statement: Added  bungee cord walking in all directions with more difficulty with eccentric control.  Able to complete 5' on elliptical today without difficulty. PT Plan: Continue; increase to blue ball next visit with rebounder.      Problem List Patient Active Problem List  Diagnoses  . DIABETES MELLITUS-TYPE II  . HYPERLIPIDEMIA  . HYPERTENSION  . GERD  . HEMATOCHEZIA  . SHOULDER, ARTHRITIS, DEGEN./OSTEO  . KNEE, ARTHRITIS, DEGEN./OSTEO  . DERANGEMENT MENISCUS    . SHOULDER PAIN  . BACK PAIN  . TENDINITIS, CALCIFIC, SHOULDER, LEFT  . IMPINGEMENT SYNDROME  . RUPTURE ROTATOR CUFF  . OA (osteoarthritis) of knee  . Chronic pain  . CTS (carpal tunnel syndrome)  . Foreign body of thumb, right  . Quadriceps tendon rupture  . Rupture quadriceps tendon  . Difficulty in walking  . Stiffness of joint, not elsewhere classified, lower leg    PT - End of Session Activity Tolerance: Patient tolerated treatment well General Behavior During Session: [facility] for tasks performed Cognition: [facility] for tasks performed   Aury Scollard B. Mare Ferrari, PTA 10/16/2011, 12:12 PM

## 2011-10-18 ENCOUNTER — Ambulatory Visit (HOSPITAL_COMMUNITY)
Admission: RE | Admit: 2011-10-18 | Discharge: 2011-10-18 | Disposition: A | Discharge status: Home / Self Care | Payer: Medicare Other | Source: Ambulatory Visit | Attending: Internal Medicine | Admitting: Internal Medicine

## 2011-10-18 DIAGNOSIS — M25669 Stiffness of unspecified knee, not elsewhere classified: Secondary | ICD-10-CM

## 2011-10-18 DIAGNOSIS — R262 Difficulty in walking, not elsewhere classified: Secondary | ICD-10-CM

## 2011-10-18 NOTE — Progress Notes (Signed)
Physical Therapy Treatment Patient Details  Name: ORIS CUSATO MRN: [id number] Date of Birth: [birth year]  Today's Date: 10/18/2011 Time: [id number] Time Calculation (min): 55 min Visit#: 20  of 24   Re-eval: 10/27/11 Assessment Diagnosis: R quadricep tendon repair Surgical Date: 07/21/11 Next MD Visit: n/a  Charge: therex 40 min Ice 10 min  Subjective: Symptoms/Limitations Symptoms: R knee/quad feeling good today, no pain. Pain Assessment Currently in Pain?: No/denies  Objective:   Exercise/Treatments Aerobic Elliptical: 5' fwd L2 Machines for Strengthening Cybex Knee Extension: 1.5 PL, 2X15 reps w/ R only Cybex Knee Flexion: 4.5 2x15 R only Standing Lateral Step Up: 15 reps;Step Height: 4";Hand Hold: 1 Forward Step Up: 15 reps;Hand Hold: 0;Step Height: 6";Step Height: 4";10 reps;Limitations Forward Step Up Limitations: power ups 10 reps 4in step Step Down: 15 reps;Step Height: 4";Hand Hold: 1 Wall Squat: 15 reps;10 seconds Rebounder: yellow ball R SLS 10 reps on static surface, 10 reps on foam Walking with Sports Cord: thickest cord, 5 reps fwd/bkwd/R/L Other Standing Knee Exercises: balance beam tandem gait 2 RT Seated Other Seated Knee Exercises: isokinetic biodex; 150-120-90-75 up 12 reps each    Physical Therapy Assessment and Plan PT Assessment and Plan Clinical Impression Statement: Increased height with step up for quad strengthening without difficulty, pt stated too pain to complete increase height with lateral or step down.  Began power ups for strengthening/power with no c/o but did present with quad weakness.  Pt able to complete quad cybex machine R LE only both extend and flex eccentricly with no L assistance.  Able to increase weight with rebounder with more LOB episodes, pt able to regain balance independently with no A required.   PT Plan: Continue progressing quad strength.    Goals    Problem List Patient Active Problem List  Diagnoses  .  DIABETES MELLITUS-TYPE II  . HYPERLIPIDEMIA  . HYPERTENSION  . GERD  . HEMATOCHEZIA  . SHOULDER, ARTHRITIS, DEGEN./OSTEO  . KNEE, ARTHRITIS, DEGEN./OSTEO  . DERANGEMENT MENISCUS  . SHOULDER PAIN  . BACK PAIN  . TENDINITIS, CALCIFIC, SHOULDER, LEFT  . IMPINGEMENT SYNDROME  . RUPTURE ROTATOR CUFF  . OA (osteoarthritis) of knee  . Chronic pain  . CTS (carpal tunnel syndrome)  . Foreign body of thumb, right  . Quadriceps tendon rupture  . Rupture quadriceps tendon  . Difficulty in walking  . Stiffness of joint, not elsewhere classified, lower leg    PT - End of Session Activity Tolerance: Patient tolerated treatment well General Behavior During Session: [facility] for tasks performed Cognition: [facility] for tasks performed  GP No functional reporting required  Aldona Lento, PTA 10/18/2011, 11:55 AM

## 2011-10-20 ENCOUNTER — Ambulatory Visit (HOSPITAL_COMMUNITY)
Admission: RE | Admit: 2011-10-20 | Discharge: 2011-10-20 | Disposition: A | Discharge status: Home / Self Care | Payer: Medicare Other | Source: Ambulatory Visit | Attending: Physical Therapy | Admitting: Physical Therapy

## 2011-10-20 DIAGNOSIS — M25669 Stiffness of unspecified knee, not elsewhere classified: Secondary | ICD-10-CM

## 2011-10-20 DIAGNOSIS — R262 Difficulty in walking, not elsewhere classified: Secondary | ICD-10-CM

## 2011-10-20 NOTE — Progress Notes (Signed)
Physical Therapy Treatment Patient Details  Name: [name] [name] MRN: [id number] Date of Birth: 06/27/1951  Today's Date: 10/20/2011 Time: 1100-1150 Time Calculation (min): 50 min Visit#: 21  of 24   Re-eval: 10/27/11    Subjective: Symptoms/Limitations Symptoms: Pt states his pain is up feels like it is due to the weather. Pain Assessment Currently in Pain?: Yes Pain Score:   5 Pain Location: Knee Pain Orientation: Right   Exercise/Treatments   Aerobic Elliptical: 5' fwd L2 Machines for Strengthening Cybex Knee Extension: 1.5 PL, 2X10 reps w/ R only Cybex Knee Flexion: 4.5 2x10 R only Plyometrics   Standing Lateral Step Up: 15 reps;Step Height: 4";Hand Hold: 1 Forward Step Up: 15 reps;Hand Hold: 0;Step Height: 4";10 reps Forward Step Up Limitations: power ups 10 reps 4in step Step Down: 15 reps;Step Height: 4";Hand Hold: 1 Wall Squat: 15 reps;10 seconds Rebounder: blue ball R SLS 10 reps on static surface, 10 reps on foam Walking with Sports Cord: thickest cord, 5 reps fwd/bkwd/R/L Other Standing Knee Exercises: balance beam tandem gait 2 RT Seated Other Seated Knee Exercises: isokinetic biodex; 150-120-90-75 up 12 reps each     Physical Therapy Assessment and Plan PT Assessment and Plan Clinical Impression Statement: Pt continues to slowly gain strength; unable to progress today due to increase pain.  PT Plan: squat to get blue ball off 4" step raise up onto toes next treatment    Goals    Problem List Patient Active Problem List  Diagnoses  . DIABETES MELLITUS-TYPE II  . HYPERLIPIDEMIA  . HYPERTENSION  . GERD  . HEMATOCHEZIA  . SHOULDER, ARTHRITIS, DEGEN./OSTEO  . KNEE, ARTHRITIS, DEGEN./OSTEO  . DERANGEMENT MENISCUS  . SHOULDER PAIN  . BACK PAIN  . TENDINITIS, CALCIFIC, SHOULDER, LEFT  . IMPINGEMENT SYNDROME  . RUPTURE ROTATOR CUFF  . OA (osteoarthritis) of knee  . Chronic pain  . CTS (carpal tunnel syndrome)  . Foreign body of thumb, right  .  Quadriceps tendon rupture  . Rupture quadriceps tendon  . Difficulty in walking  . Stiffness of joint, not elsewhere classified, lower leg    PT - End of Session Activity Tolerance: Patient tolerated treatment well General Behavior During Session: [facility] for tasks performed  GP No functional reporting required  Matison Nuccio,CINDY 10/20/2011, 5:56 PM

## 2011-10-25 ENCOUNTER — Inpatient Hospital Stay (HOSPITAL_COMMUNITY): Admission: RE | Admit: 2011-10-25 | Payer: Medicare Other | Source: Ambulatory Visit

## 2011-10-26 ENCOUNTER — Ambulatory Visit (HOSPITAL_COMMUNITY)
Admission: RE | Admit: 2011-10-26 | Discharge: 2011-10-26 | Disposition: A | Discharge status: Home / Self Care | Payer: Medicare Other | Source: Ambulatory Visit | Attending: *Deleted | Admitting: *Deleted

## 2011-10-26 NOTE — Progress Notes (Signed)
Physical Therapy Treatment Patient Details  Name: [name] [name] MRN: [id number] Date of Birth: 1950-10-24  Today's Date: 10/26/2011 Time: [id number] Time Calculation (min): 47 min Visit#: 22  of 24   Re-eval: 10/27/11 Charges: Therex x 40'  Subjective: Symptoms/Limitations Symptoms: Pt states that both of his legs are sore because he did a lot of walking yesterday. Pain Assessment Currently in Pain?: Yes Pain Score:   5 Pain Location: Knee Pain Orientation: Right;Left   Exercise/Treatments Aerobic Elliptical: 5' fwd L2 Machines for Strengthening Cybex Knee Extension: 1.5 PL, 2X10 reps w/ R leg eccentric control Cybex Knee Flexion: 4.5 2x10 R only Standing Lateral Step Up: 15 reps;Step Height: 4";Hand Hold: 1 Forward Step Up: 15 reps;Step Height: 4";10 reps Forward Step Up Limitations: power ups Step Down: 15 reps;Step Height: 4";Hand Hold: 1 Wall Squat: 15 reps;10 seconds Rebounder: blue ball R SLS 10 reps on foam Other Standing Knee Exercises: balance beam tandem gait 2 RT Other Standing Knee Exercises: squats with blue weighted ball from 4" step to toe raise x 10  Physical Therapy Assessment and Plan PT Assessment and Plan Clinical Impression Statement: Pt continues to have most difficulty with knee ext secondary to quad weakness and pain. Began squats with blue weighted ball from 4" step to toe raise. Pt reports pain decrease at end of session to 2/10. Pt denies need for ice. PT Plan: Reassess next session.     Problem List Patient Active Problem List  Diagnoses  . DIABETES MELLITUS-TYPE II  . HYPERLIPIDEMIA  . HYPERTENSION  . GERD  . HEMATOCHEZIA  . SHOULDER, ARTHRITIS, DEGEN./OSTEO  . KNEE, ARTHRITIS, DEGEN./OSTEO  . DERANGEMENT MENISCUS  . SHOULDER PAIN  . BACK PAIN  . TENDINITIS, CALCIFIC, SHOULDER, LEFT  . IMPINGEMENT SYNDROME  . RUPTURE ROTATOR CUFF  . OA (osteoarthritis) of knee  . Chronic pain  . CTS (carpal tunnel syndrome)  . Foreign body of  thumb, right  . Quadriceps tendon rupture  . Rupture quadriceps tendon  . Difficulty in walking  . Stiffness of joint, not elsewhere classified, lower leg    PT - End of Session Activity Tolerance: Patient tolerated treatment well General Behavior During Session: [facility] for tasks performed Cognition: [facility] for tasks performed   Rachelle Hora, PTA 10/26/2011, 9:54 AM

## 2011-10-30 ENCOUNTER — Ambulatory Visit (HOSPITAL_COMMUNITY)
Admission: RE | Admit: 2011-10-30 | Discharge: 2011-10-30 | Disposition: A | Discharge status: Home / Self Care | Payer: Medicare Other | Source: Ambulatory Visit | Attending: Internal Medicine | Admitting: Internal Medicine

## 2011-10-30 DIAGNOSIS — M25669 Stiffness of unspecified knee, not elsewhere classified: Secondary | ICD-10-CM

## 2011-10-30 DIAGNOSIS — R262 Difficulty in walking, not elsewhere classified: Secondary | ICD-10-CM

## 2011-10-30 NOTE — Evaluation (Signed)
Physical Therapy Evaluation  Patient Details  Name: [name] [name] MRN: [id number] Date of Birth: 1950-10-21  Today's Date: 10/30/2011 Time: [id number] Time Calculation (min): 35 min  Visit#: 23  of 23   Re-eval:   Assessment Diagnosis: R quadricep tendon repair Surgical Date: 07/21/11 Next MD Visit: n/a  Past Medical History:  Past Medical History  Diagnosis Date  . COPD (chronic obstructive pulmonary disease)   . Sinusitis   . PUD (peptic ulcer disease)     remote, reports f/u EGD about 8 years ago unremarkable   . Diabetes   . GERD (gastroesophageal reflux disease)   . Hyperlipidemia   . Hypertension   . Sleep apnea   . Post traumatic stress disorder   . Anxiety   . Depression   . Bell palsy    Past Surgical History:  Past Surgical History  Procedure Date  . Asad lt shoulder 12/2008    left shoulder  . Umbilical hernia repair [id number]    [place]  . Knee arthroscopy 10/2007    left  . Toenail excision     removed x2-bilateral  . Eye surgery 12/22/2010    tear duct probing-[place]  . Foreign body removal 03/29/2011    Procedure: REMOVAL FOREIGN BODY EXTREMITY;  Surgeon: Arther Abbott, MD;  Location: [facility];  Service: Orthopedics;  Laterality: Right;  Removal Foreign Body Right Thumb  . Quadriceps tendon repair 07/21/2011    Procedure: REPAIR QUADRICEP TENDON;  Surgeon: Arther Abbott, MD;  Location: [facility];  Service: Orthopedics;  Laterality: Right;    Subjective Symptoms/Limitations Symptoms: Pt states that he is working out at [employer]. How long can you sit comfortably?: Pt states that he is able to sit but he constantly moves it so it doesn't tighten up on him.   How long can you stand comfortably?: The patient states that he able to stand for 45 minutes. How long can you walk comfortably?: The longest the patient has walked has been 30 minutes.  His knee is not bothering him at this time he has just finished his normal activities. Special Tests: The patient  states he only wakes up from the knee twice a week now; ;he was waking up 2-3 times a night. Pain Assessment Currently in Pain?: No/denies (stiff.) Pain Location: Knee Pain Orientation: Right Pain Type: Surgical pain;Chronic pain  Precautions/Restrictions     Prior Functioning     Cognition/Observation    Sensation/Coordination/Flexibility/Functional Tests Functional Tests Functional Tests: LEFS 49 was 47  Assessment RLE AROM (degrees) Right Knee Extension 0-130: 0  Right Knee Flexion 0-140: 128  (was 120) RLE Strength Right Hip Flexion: 5/5 Right Hip Extension: 5/5 (was 4/5 last re-eval) Right Hip ABduction: 5/5 Right Hip ADduction: 5/5 Right Knee Flexion: 5/5 (one max rep is at 100% of L) Right Knee Extension: 3-/5 (from one max rep pt is at 42% of L; was at 24% of L ) Right Ankle Dorsiflexion: 5/5 Right Ankle Plantar Flexion: 5/5 (was 4/5)  Exercise/Treatments Re measured for ROM/ strength only today.    Physical Therapy Assessment and Plan PT Assessment and Plan Clinical Impression Statement: Pt has improved.  Only limitation is quad strength which pt is going to work on at [employer].  If patient does not feel he is progressing in two weeks he will call the clinic to retrun to skilled therapy/    Goals Home Exercise Program PT Goal: Perform Home Exercise Program - Progress: Met PT Short Term Goals PT Short Term  Goal 1 - Progress: Met PT Short Term Goal 2 - Progress: Met PT Short Term Goal 3 - Progress: Met PT Long Term Goals PT Long Term Goal 1 - Progress: Progressing toward goal PT Long Term Goal 2 - Progress: Progressing toward goal Long Term Goal 3 Progress: Met Long Term Goal 4 Progress: Met Long Term Goal 5 Progress: Met  Problem List Patient Active Problem List  Diagnoses  . DIABETES MELLITUS-TYPE II  . HYPERLIPIDEMIA  . HYPERTENSION  . GERD  . HEMATOCHEZIA  . SHOULDER, ARTHRITIS, DEGEN./OSTEO  . KNEE, ARTHRITIS, DEGEN./OSTEO  . DERANGEMENT  MENISCUS  . SHOULDER PAIN  . BACK PAIN  . TENDINITIS, CALCIFIC, SHOULDER, LEFT  . IMPINGEMENT SYNDROME  . RUPTURE ROTATOR CUFF  . OA (osteoarthritis) of knee  . Chronic pain  . CTS (carpal tunnel syndrome)  . Foreign body of thumb, right  . Quadriceps tendon rupture  . Rupture quadriceps tendon  . Difficulty in walking  . Stiffness of joint, not elsewhere classified, lower leg    PT - End of Session Activity Tolerance: Patient tolerated treatment well General Behavior During Session: [facility] for tasks performed Cognition: [facility] for tasks performed    Charlee Whitebread,CINDY 10/30/2011, 2:29 PM  Physician Documentation Your signature is required to indicate approval of the treatment plan as stated above.  Please sign and either send electronically or make a copy of this report for your files and return this physician signed original.   Please mark one 1.__approve of plan  2. ___approve of plan with the following conditions.   ______________________________                                                          _____________________ Physician Signature                                                                                                             Date

## 2011-11-01 ENCOUNTER — Ambulatory Visit (HOSPITAL_COMMUNITY): Payer: Medicare Other | Admitting: *Deleted

## 2011-11-03 ENCOUNTER — Ambulatory Visit (HOSPITAL_COMMUNITY): Payer: Medicare Other

## 2011-11-06 ENCOUNTER — Ambulatory Visit (HOSPITAL_COMMUNITY): Payer: Medicare Other | Admitting: Physical Therapy

## 2011-11-08 ENCOUNTER — Ambulatory Visit (HOSPITAL_COMMUNITY): Payer: Medicare Other

## 2011-11-10 ENCOUNTER — Ambulatory Visit (HOSPITAL_COMMUNITY): Payer: Medicare Other

## 2011-11-13 ENCOUNTER — Ambulatory Visit (HOSPITAL_COMMUNITY): Payer: Medicare Other | Admitting: Physical Therapy

## 2011-11-14 ENCOUNTER — Telehealth: Payer: Self-pay | Admitting: Orthopedic Surgery

## 2011-11-14 NOTE — Telephone Encounter (Signed)
Patient called to request his pain medication prescription for Percocet, which he said Dr. Aline Brochure is to write.  If any other medication is to be written other than Percocet, his pharmacy is [facility]. Patient ph# is [phone] (Home).

## 2011-11-15 ENCOUNTER — Ambulatory Visit (HOSPITAL_COMMUNITY): Payer: Medicare Other | Admitting: Physical Therapy

## 2011-11-17 ENCOUNTER — Ambulatory Visit (HOSPITAL_COMMUNITY): Payer: Medicare Other

## 2011-11-20 ENCOUNTER — Ambulatory Visit (HOSPITAL_COMMUNITY): Payer: Medicare Other | Admitting: Physical Therapy

## 2011-11-22 ENCOUNTER — Ambulatory Visit (HOSPITAL_COMMUNITY): Payer: Medicare Other

## 2011-11-24 ENCOUNTER — Ambulatory Visit (HOSPITAL_COMMUNITY): Payer: Medicare Other | Admitting: Physical Therapy

## 2011-11-27 ENCOUNTER — Ambulatory Visit: Payer: Self-pay

## 2011-11-27 ENCOUNTER — Other Ambulatory Visit: Payer: Self-pay | Admitting: Occupational Medicine

## 2011-11-27 DIAGNOSIS — Z0000 Encounter for general adult medical examination without abnormal findings: Secondary | ICD-10-CM

## 2011-11-29 ENCOUNTER — Ambulatory Visit (INDEPENDENT_AMBULATORY_CARE_PROVIDER_SITE_OTHER): Payer: Medicare Other | Admitting: Orthopedic Surgery

## 2011-11-29 ENCOUNTER — Ambulatory Visit (INDEPENDENT_AMBULATORY_CARE_PROVIDER_SITE_OTHER): Payer: Medicare Other

## 2011-11-29 ENCOUNTER — Encounter: Payer: Self-pay | Admitting: Orthopedic Surgery

## 2011-11-29 VITALS — BP 120/72 | Ht 64.0 in | Wt 233.0 lb

## 2011-11-29 DIAGNOSIS — M25569 Pain in unspecified knee: Secondary | ICD-10-CM

## 2011-11-29 DIAGNOSIS — M1710 Unilateral primary osteoarthritis, unspecified knee: Secondary | ICD-10-CM

## 2011-11-29 DIAGNOSIS — M179 Osteoarthritis of knee, unspecified: Secondary | ICD-10-CM

## 2011-11-29 DIAGNOSIS — IMO0002 Reserved for concepts with insufficient information to code with codable children: Secondary | ICD-10-CM

## 2011-11-29 MED ORDER — HYDROCODONE-ACETAMINOPHEN 7.5-325 MG PO TABS: 1.0000 | ORAL_TABLET | Freq: Four times a day (QID) | ORAL | Status: DC | PRN

## 2011-11-29 MED ORDER — HYDROCODONE-ACETAMINOPHEN 7.5-325 MG PO TABS: 1.0000 | ORAL_TABLET | Freq: Four times a day (QID) | ORAL | Status: AC | PRN

## 2011-11-29 NOTE — Progress Notes (Signed)
Patient ID: [name] [name], male   DOB: 04-May-1951, 61 y.o.   MRN: [id number] Chief Complaint  Patient presents with  . Follow-up    6 month follow up left knee    X-rays her scheduled for today for [name] for his left knee  History of osteoarthritis left knee  Followup x-rays to check his arthritis has progressed well.  Patient complains of medial joint line pain  Is currently on a leave and he takes Ritalin L. as a topical agent.  Recent surgical history quadriceps tendon repair also complains of medial joint line pain and weakness in the right leg.  Review of systems chronic joint pain.  Exam he does have medial joint line tenderness over the left knee and also some in the patellofemoral region. His flexion arc is 125 his knee is stable he has good quadriceps tone the skin is intact has normal pulses and temperature normal sensation  X-rays show moderate amount of medial joint space narrowing but he has not gone to bone  Impression osteoarthritis stable  Recommend continued followup in 6 months with repeat x-ray. Continue Aleve and red oil and use hydrocodone 7.5 mg for pain as needed

## 2011-11-29 NOTE — Patient Instructions (Signed)
Continue Aleve and start hydrocodone

## 2012-04-25 ENCOUNTER — Ambulatory Visit (INDEPENDENT_AMBULATORY_CARE_PROVIDER_SITE_OTHER): Payer: Medicare Other

## 2012-04-25 ENCOUNTER — Ambulatory Visit (INDEPENDENT_AMBULATORY_CARE_PROVIDER_SITE_OTHER): Payer: Medicare Other | Admitting: Orthopedic Surgery

## 2012-04-25 ENCOUNTER — Encounter: Payer: Self-pay | Admitting: Orthopedic Surgery

## 2012-04-25 VITALS — BP 142/80 | Ht 64.0 in | Wt 233.0 lb

## 2012-04-25 DIAGNOSIS — M25519 Pain in unspecified shoulder: Secondary | ICD-10-CM

## 2012-04-25 DIAGNOSIS — M25511 Pain in right shoulder: Secondary | ICD-10-CM

## 2012-04-25 DIAGNOSIS — M75101 Unspecified rotator cuff tear or rupture of right shoulder, not specified as traumatic: Secondary | ICD-10-CM | POA: Insufficient documentation

## 2012-04-25 DIAGNOSIS — S43429A Sprain of unspecified rotator cuff capsule, initial encounter: Secondary | ICD-10-CM

## 2012-04-25 NOTE — Patient Instructions (Signed)
You have been scheduled for an MRI scan.  Your insurance company requires a precertification prior to scheduling the MRI.  If the MRI scan is not approved we will let you know and make further treatment recommendations according to your insurance's guidelines.    We will schedule you for another  appointment to review the results and make further treatment recommendations

## 2012-04-25 NOTE — Progress Notes (Signed)
Patient ID: [name] [name], male   DOB: 07-15-1951, 61 y.o.   MRN: [id number] Chief Complaint  Patient presents with  . Shoulder Pain    right shoulder and arm pain, injured 12/2011     This 61 year old male presents with right shoulder pain aching fairly severe for approximately 4 months after falling from a ladder grabbing onto the [place] of the house. He felt acute pain in the right shoulder and since that time has had painful range of motion decreased range of motion night pain weakness and painful for elevation.  System review no numbness or tingling in the arm. Weakness as described. No fever chills.  Past Medical History  Diagnosis Date  . COPD (chronic obstructive pulmonary disease)   . Sinusitis   . PUD (peptic ulcer disease)     remote, reports f/u EGD about 8 years ago unremarkable   . Diabetes   . GERD (gastroesophageal reflux disease)   . Hyperlipidemia   . Hypertension   . Sleep apnea   . Post traumatic stress disorder   . Anxiety   . Depression   . Bell palsy     BP 142/80  Ht 5\' 4"  (1.626 m)  Wt 233 lb (105.688 kg)  BMI 39.99 kg/m2 General appearance he is endomorphic in terms of body habitus. Oriented x3 Mood affect normal Gait station normal Tenderness over the right shoulder range of motion active 90 flexion 90 abduction 50 external rotation and 4 level loss of internal rotation. Passive range of motion 150 painful range of motion between 90 and 150. Apprehension sign normal. Weakness in the supraspinatus tendon internal and external rotator strength normal skin intact pulses normal no lymph node sensation normal.  X-ray shows chronic sclerosis at the greater tuberosity type II acromion  Impression torn rotator cuff right shoulder  Recommend MRI right shoulder to look for rotator cuff tear and prepare for surgical treatment

## 2012-05-06 ENCOUNTER — Other Ambulatory Visit: Payer: Self-pay

## 2012-05-07 ENCOUNTER — Telehealth: Payer: Self-pay | Admitting: Radiology

## 2012-05-07 NOTE — Telephone Encounter (Signed)
Patient has an MRI appointment at [facility] on 05-11-12. Patient has [name] [name], authorization [id number]. Patient will follow up back here in the office.

## 2012-05-11 ENCOUNTER — Ambulatory Visit
Admission: RE | Admit: 2012-05-11 | Discharge: 2012-05-11 | Disposition: A | Discharge status: Home / Self Care | Payer: Medicare Other | Source: Ambulatory Visit | Attending: Orthopedic Surgery | Admitting: Orthopedic Surgery

## 2012-05-11 DIAGNOSIS — M25511 Pain in right shoulder: Secondary | ICD-10-CM

## 2012-05-15 ENCOUNTER — Ambulatory Visit (INDEPENDENT_AMBULATORY_CARE_PROVIDER_SITE_OTHER): Payer: Medicare Other | Admitting: Orthopedic Surgery

## 2012-05-15 ENCOUNTER — Encounter: Payer: Self-pay | Admitting: Orthopedic Surgery

## 2012-05-15 VITALS — BP 128/82 | Ht 64.0 in | Wt 233.0 lb

## 2012-05-15 DIAGNOSIS — M19019 Primary osteoarthritis, unspecified shoulder: Secondary | ICD-10-CM | POA: Insufficient documentation

## 2012-05-15 DIAGNOSIS — M75110 Incomplete rotator cuff tear or rupture of unspecified shoulder, not specified as traumatic: Secondary | ICD-10-CM

## 2012-05-15 DIAGNOSIS — S43429A Sprain of unspecified rotator cuff capsule, initial encounter: Secondary | ICD-10-CM

## 2012-05-15 MED ORDER — HYDROCODONE-ACETAMINOPHEN 10-325 MG PO TABS: 1.0000 | ORAL_TABLET | Freq: Four times a day (QID) | ORAL | Status: DC | PRN

## 2012-05-15 NOTE — Progress Notes (Signed)
Patient ID: [name] [name], male   DOB: [birth year], 61 y.o.   MRN: [id number] Chief Complaint  Patient presents with  . Follow-up    MRI results from [facility]right shoudler     Report and films are reviewed. The computer imaging.  Partial nonsurgical care rotator cuff with a.c. Joint arthrosis and mild glenohumeral joint arthrosis.  Recommended physical therapy and a follow up in 3 months

## 2012-05-15 NOTE — Patient Instructions (Signed)
Call facility to arrange PT

## 2012-05-15 NOTE — Addendum Note (Signed)
Addended by: Baldomero Lamy B on: 05/15/2012 11:25 AM   Modules accepted: Orders

## 2012-05-20 ENCOUNTER — Ambulatory Visit (HOSPITAL_COMMUNITY)
Admission: RE | Admit: 2012-05-20 | Discharge: 2012-05-20 | Disposition: A | Discharge status: Home / Self Care | Payer: Medicare Other | Source: Ambulatory Visit | Attending: Orthopedic Surgery | Admitting: Orthopedic Surgery

## 2012-05-20 DIAGNOSIS — S43429A Sprain of unspecified rotator cuff capsule, initial encounter: Secondary | ICD-10-CM | POA: Insufficient documentation

## 2012-05-20 DIAGNOSIS — M75120 Complete rotator cuff tear or rupture of unspecified shoulder, not specified as traumatic: Secondary | ICD-10-CM

## 2012-05-20 DIAGNOSIS — M75101 Unspecified rotator cuff tear or rupture of right shoulder, not specified as traumatic: Secondary | ICD-10-CM

## 2012-05-20 DIAGNOSIS — Z5189 Encounter for other specified aftercare: Secondary | ICD-10-CM | POA: Insufficient documentation

## 2012-05-20 DIAGNOSIS — X58XXXA Exposure to other specified factors, initial encounter: Secondary | ICD-10-CM | POA: Insufficient documentation

## 2012-05-20 DIAGNOSIS — M19019 Primary osteoarthritis, unspecified shoulder: Secondary | ICD-10-CM

## 2012-05-20 DIAGNOSIS — M6281 Muscle weakness (generalized): Secondary | ICD-10-CM | POA: Insufficient documentation

## 2012-05-20 DIAGNOSIS — M25519 Pain in unspecified shoulder: Secondary | ICD-10-CM

## 2012-05-20 NOTE — Evaluation (Signed)
Occupational Therapy Evaluation  Patient Details  Name: [name] [name] MRN: [id number] Date of Birth: Aug 04, 1950  Today's Date: 05/20/2012 Time: 1350-1420 OT Time Calculation (min): 30 min OT Evaluation 30' Visit#: 1  of 12   Re-eval: 06/17/12  Assessment Diagnosis: Right Partial Rotator Cuff Tear and AC Joint and Glenohumeral Joint Arthrosis Next MD Visit: 06/04/12 Prior Therapy: Left RCR OT  Authorization: Medicare - Dr. Ruthe Mannan order states that he has 10 visits  Authorization Time Period: before 10th visit  Authorization Visit#: 1  of 10    Past Medical History:  Past Medical History  Diagnosis Date  . COPD (chronic obstructive pulmonary disease)   . Sinusitis   . PUD (peptic ulcer disease)     remote, reports f/u EGD about 8 years ago unremarkable   . Diabetes   . GERD (gastroesophageal reflux disease)   . Hyperlipidemia   . Hypertension   . Sleep apnea   . Post traumatic stress disorder   . Anxiety   . Depression   . Bell palsy    Past Surgical History:  Past Surgical History  Procedure Date  . Asad lt shoulder 12/2008    left shoulder  . Umbilical hernia repair [id number]    [place]  . Knee arthroscopy 10/2007    left  . Toenail excision     removed x2-bilateral  . Eye surgery 12/22/2010    tear duct probing-[place]  . Foreign body removal 03/29/2011    Procedure: REMOVAL FOREIGN BODY EXTREMITY;  Surgeon: Arther Abbott, MD;  Location: [facility];  Service: Orthopedics;  Laterality: Right;  Removal Foreign Body Right Thumb  . Quadriceps tendon repair 07/21/2011    Procedure: REPAIR QUADRICEP TENDON;  Surgeon: Arther Abbott, MD;  Location: [facility];  Service: Orthopedics;  Laterality: Right;    Subjective S:  I was hanging some rafters at my house, and the ladder fell out from under me.  I ended up hanging from my shoulders.   Pertinent History: [name] injured his right shoulder while working at his home.  He was standing on a ladder to hang some rafters.   The ladder fell out from under him, and he ended up hanging from his armpits.  He consulted with Dr. Aline Brochure.  He had an MRI, which detected a partial supraspinatus tear and tendonapathy of the biceps and subscapularis tendons.  He has been referred to occupational therapy for evaluation and treatment.   Special Tests: UEFI 42/80 = 52% Patient Stated Goals: I want to use my right arm as close to 100% as possible. Pain Assessment Currently in Pain?: Yes Pain Score:   4 Pain Location: Shoulder Pain Orientation: Right;Anterior Pain Type: Acute pain  Precautions/Restrictions  Precautions Precautions: None  Prior Functioning  Home Living Lives With: Spouse Prior Function Level of Independence: Independent with basic ADLs;Independent with homemaking with ambulation Driving: Yes Vocation: On disability Leisure: Hobbies-yes (Comment) Comments: [name] enjoys fishing  Assessment ADL/Vision/Perception ADL ADL Comments: Reaching to side is painful, Lifting, weighbearing, and overhead reaching is difficult Dominant Hand: Right  Cognition/Observation Cognition Overall Cognitive Status: Appears within functional limits for tasks assessed  Sensation/Coordination/Edema Sensation Light Touch: Appears Intact  Additional Assessments RUE AROM (degrees) RUE Overall AROM Comments: assessed in seated Right Shoulder Flexion: 145 Degrees Right Shoulder ABduction: 90 Degrees Right Shoulder Internal Rotation: 70 Degrees Right Shoulder External Rotation: 42 Degrees RUE PROM (degrees) RUE Overall PROM Comments: assessed in seated [facility] RUE Strength Right Shoulder Flexion: 4/5 Right Shoulder ABduction:  (  4-/5) Right Shoulder Internal Rotation:  (4+/5) Right Shoulder External Rotation:  (4+/5) Palpation Palpation: min-mod fascial restrictions in right upper arm and scapular region     Exercise/Treatments    Manual Therapy Manual Therapy: Myofascial release Myofascial Release: MFR to  right upper arm and shoulder region for assessment of restrictions  Occupational Therapy Assessment and Plan OT Assessment and Plan Clinical Impression Statement: A:  [name] is a 61  year old male with a partial right rotator cuff tear which is effecting his functional use of his right arm with functional activities due to increased pain and restrictions and decreased AROM and strength in his RUE. Pt will benefit from skilled therapeutic intervention in order to improve on the following deficits: Decreased range of motion;Decreased strength;Increased fascial restricitons;Increased muscle spasms;Pain Rehab Potential: Good OT Frequency: Min 2X/week OT Duration: 6 weeks OT Treatment/Interventions: Self-care/ADL training;Therapeutic exercise;Manual therapy;Modalities;Therapeutic activities;Patient/family education OT Plan: P:  Skilled OT intervention to decrease pain and restrictions and increase AROM and strength in his right shoulder needed to return to prior level of I with all B/IADLs and leisure activities.  Treatment Plan:  MFR and manual stretching  in supine.  AAROM in supine.  Seated AROM  elev, ext, ret.  ball stretches, low wall wash, thumb tacks, prot/ret//elev.dep.  Progress as tolerated.   Goals Short Term Goals Time to Complete Short Term Goals: 3 weeks Short Term Goal 1: Patient will be educated on HEP. Short Term Goal 2: Patient will increase PROM in right shoulder to WNL for increased ability to reach into overhead cabinets. Short Term Goal 3: Patient will increase right shoulder flexors and abductors to 4+/5 strength for increased ability to lift tools when working around the house. Short Term Goal 4: Patient will decrease fascial restrictions in right shoulder to minimal. Short Term Goal 5: Patient will decrease pain in his right shoulder to 2/10 when reaching out to the side to grab the television remote. Long Term Goals Time to Complete Long Term Goals: 6 weeks Long Term  Goal 1: Patient will return to prior level of I with all B/IADLs and leisure activities, using his right arm as dominant. Long Term Goal 2: Patient will increase right shoulder AROM to [facility] for increased ability to reach into overhead cabinets. Long Term Goal 3: Patient will increase right shoulder strength to 5/5 for increased ability to lift bags of groceries onto the countertop. Long Term Goal 4: Patient will decrease pain in right shoulder to 1/10 when working around the house. Long Term Goal 5: Patient will decrease fascial restrictions to trace in his right shoulder region.  Problem List Patient Active Problem List  Diagnosis  . DIABETES MELLITUS-TYPE II  . HYPERLIPIDEMIA  . HYPERTENSION  . GERD  . HEMATOCHEZIA  . SHOULDER, ARTHRITIS, DEGEN./OSTEO  . KNEE, ARTHRITIS, DEGEN./OSTEO  . DERANGEMENT MENISCUS  . SHOULDER PAIN  . BACK PAIN  . TENDINITIS, CALCIFIC, SHOULDER, LEFT  . IMPINGEMENT SYNDROME  . RUPTURE ROTATOR CUFF  . OA (osteoarthritis) of knee  . Chronic pain  . CTS (carpal tunnel syndrome)  . Foreign body of thumb, right  . Quadriceps tendon rupture  . Rupture quadriceps tendon  . Difficulty in walking  . Stiffness of joint, not elsewhere classified, lower leg  . Knee pain  . Rotator cuff tear, right  . Partial nontraumatic rupture of rotator cuff  . Shoulder arthritis  . Muscle weakness (generalized)    End of Session Activity Tolerance: Patient tolerated treatment well General Behavior  During Session: [facility] for tasks performed Cognition: [facility] for tasks performed OT Plan of Care OT Home Exercise Plan: Educated on dowel rod exercises in supine. Consulted and Agree with Plan of Care: Patient  GO Functional Assessment Tool Used: UEFI scored 42/80 = 52% I level  and 48% impairment level Functional Limitation: Carrying, moving and handling objects Carrying, Moving and Handling Objects Current Status [id number]): At least 40 percent but less than 60 percent impaired,  limited or restricted Carrying, Moving and Handling Objects Goal Status [phone]): At least 1 percent but less than 20 percent impaired, limited or restricted  Vangie Bicker, OTR/L  05/20/2012, 9:24 PM  Physician Documentation Your signature is required to indicate approval of the treatment plan as stated above.  Please sign and either send electronically or make a copy of this report for your files and return this physician signed original.  Please mark one 1.__approve of plan  2. ___approve of plan with the following conditions.   ______________________________                                                          _____________________ Physician Signature                                                                                                             Date

## 2012-05-27 ENCOUNTER — Ambulatory Visit (HOSPITAL_COMMUNITY)
Admission: RE | Admit: 2012-05-27 | Discharge: 2012-05-27 | Disposition: A | Discharge status: Home / Self Care | Payer: Medicare Other | Source: Ambulatory Visit | Attending: Orthopedic Surgery | Admitting: Orthopedic Surgery

## 2012-05-27 DIAGNOSIS — M6281 Muscle weakness (generalized): Secondary | ICD-10-CM

## 2012-05-27 NOTE — Progress Notes (Signed)
Occupational Therapy Treatment Patient Details  Name: [name] [name] MRN: [id number] Date of Birth: 1951-06-25  Today's Date: 05/27/2012 Time: [id number] OT Time Calculation (min): 36 min Manual Therapy [id number] 13' Therapeutic Exercises [phone] 23' Visit#: 2  of 12   Re-eval: 06/17/12    Authorization: Medicare - Dr. Ruthe Mannan order states that he has 10 visits   Authorization Time Period: before 10th visit   Authorization Visit#: 2  of 10   Subjective S:  It depends what I am doing if it hurts or not. Pain Assessment Currently in Pain?: Yes Pain Score:   3 Pain Location: Shoulder Pain Orientation: Right Pain Type: Acute pain  Precautions/Restrictions   progress as tolerated  Exercise/Treatments Supine Protraction: PROM;10 reps;AAROM;15 reps Horizontal ABduction: PROM;10 reps;AAROM;15 reps External Rotation: PROM;10 reps;AAROM;15 reps Internal Rotation: PROM;10 reps;AAROM;15 reps Flexion: PROM;10 reps;AAROM;15 reps ABduction: PROM;10 reps;AAROM;15 reps Seated Elevation: AROM;10 reps Extension: AROM;10 reps Retraction: AROM;10 reps Row: AROM;10 reps   Therapy Ball Flexion: 20 reps ABduction: 20 reps ROM / Strengthening / Isometric Strengthening Wall Wash: 1' Thumb Tacks: 1' Proximal Shoulder Strengthening, Supine: 10 reps x 1 set Prot/Ret//Elev/Dep: 1'      Manual Therapy Manual Therapy: Myofascial release Myofascial Release: MFR and manual stretching to right anterior shoulder/pectoral region, upper arm, posterior shoulder and scapular region to decrease pain and restrictions and increase pain free mobility.  [id number]  Occupational Therapy Assessment and Plan OT Assessment and Plan Clinical Impression Statement: A:  Mr. [name] [name] demonstrates full AAROM in supine this date.  Occassional vg required during exercises to maintain depressed scapular position.  OT Plan: P:  Add AAROM in seated and AROM in supine.  Increase to 2' with wall wash.  Attempt ball  circles.   Goals Short Term Goals Time to Complete Short Term Goals: 3 weeks Short Term Goal 1: Patient will be educated on HEP. Short Term Goal 2: Patient will increase PROM in right shoulder to WNL for increased ability to reach into overhead cabinets. Short Term Goal 3: Patient will increase right shoulder flexors and abductors to 4+/5 strength for increased ability to lift tools when working around the house. Short Term Goal 4: Patient will decrease fascial restrictions in right shoulder to minimal. Short Term Goal 5: Patient will decrease pain in his right shoulder to 2/10 when reaching out to the side to grab the television remote. Long Term Goals Time to Complete Long Term Goals: 6 weeks Long Term Goal 1: Patient will return to prior level of I with all B/IADLs and leisure activities, using his right arm as dominant. Long Term Goal 2: Patient will increase right shoulder AROM to [facility] for increased ability to reach into overhead cabinets. Long Term Goal 3: Patient will increase right shoulder strength to 5/5 for increased ability to lift bags of groceries onto the countertop. Long Term Goal 4: Patient will decrease pain in right shoulder to 1/10 when working around the house. Long Term Goal 5: Patient will decrease fascial restrictions to trace in his right shoulder region.  Problem List Patient Active Problem List  Diagnosis  . DIABETES MELLITUS-TYPE II  . HYPERLIPIDEMIA  . HYPERTENSION  . GERD  . HEMATOCHEZIA  . SHOULDER, ARTHRITIS, DEGEN./OSTEO  . KNEE, ARTHRITIS, DEGEN./OSTEO  . DERANGEMENT MENISCUS  . SHOULDER PAIN  . BACK PAIN  . TENDINITIS, CALCIFIC, SHOULDER, LEFT  . IMPINGEMENT SYNDROME  . RUPTURE ROTATOR CUFF  . OA (osteoarthritis) of knee  . Chronic pain  . CTS (carpal tunnel syndrome)  .  Foreign body of thumb, right  . Quadriceps tendon rupture  . Rupture quadriceps tendon  . Difficulty in walking  . Stiffness of joint, not elsewhere classified, lower leg  .  Knee pain  . Rotator cuff tear, right  . Partial nontraumatic rupture of rotator cuff  . Shoulder arthritis  . Muscle weakness (generalized)    End of Session Activity Tolerance: Patient tolerated treatment well General Behavior During Session: [facility] for tasks performed Cognition: [facility] for tasks performed  [place], OTR/L  05/27/2012, 10:16 AM

## 2012-05-31 ENCOUNTER — Ambulatory Visit (HOSPITAL_COMMUNITY)
Admission: RE | Admit: 2012-05-31 | Discharge: 2012-05-31 | Disposition: A | Discharge status: Home / Self Care | Payer: Medicare Other | Source: Ambulatory Visit | Attending: Orthopedic Surgery | Admitting: Orthopedic Surgery

## 2012-05-31 DIAGNOSIS — M6281 Muscle weakness (generalized): Secondary | ICD-10-CM

## 2012-05-31 NOTE — Progress Notes (Signed)
Occupational Therapy Treatment Patient Details  Name: [name] [name] MRN: [id number] Date of Birth: [birth year]  Today's Date: 05/31/2012 Time: 1024-1106 OT Time Calculation (min): 42 min Manual Therapy [id number] 20' Therapeutic Exercise [id number]   Visit#: 3  of 12   Re-eval: 06/17/12    Authorization: Medicare - Dr. Ruthe Mannan order states that he has 10 visits   Authorization Time Period: before 10th visit   Authorization Visit#: 3  of 10   Subjective Symptoms/Limitations Symptoms: S:  I had to drill through something last night and it hurts, I didn't rest good last night either. Pain Assessment Currently in Pain?: Yes Pain Score:   5 Pain Location: Shoulder Pain Orientation: Right Pain Type: Acute pain  Precautions/Restrictions     Exercise/Treatments Supine Protraction: PROM;AROM;10 reps Horizontal ABduction: PROM;AROM;10 reps External Rotation: PROM;AROM;10 reps Internal Rotation: PROM;AROM;10 reps Flexion: PROM;AROM;10 reps ABduction: PROM;AROM;10 reps ABduction Limitations: with guidance from therapist Seated Elevation: AROM;10 reps Extension: AROM;10 reps Retraction: AROM;10 reps Row: AROM;10 reps Protraction: AAROM;10 reps Horizontal ABduction: AAROM;10 reps External Rotation: AAROM;10 reps Internal Rotation: AAROM;10 reps Flexion: AAROM;10 reps Abduction: AAROM;10 reps Therapy Ball Flexion: 20 reps ABduction: 20 reps Right/Left: 5 reps ROM / Strengthening / Isometric Strengthening Wall Wash: time Thumb Tacks: time Proximal Shoulder Strengthening, Supine: 10 reps x 1 set Prot/Ret//Elev/Dep: time      Manual Therapy Manual Therapy: Myofascial release Myofascial Release: MFR and manual stretching to right anterior shoulder/pectoral region, upper arm, posterior shoulder and scapular region to decrease pain and restrictions and increase pain free mobility  Occupational Therapy Assessment and Plan OT Assessment and Plan Clinical Impression  Statement: P:  Switched to AROM in supine and AAROM in seated which did cause some increased pain in shoulder but ball stretches diminished his pain back to a 1 or 2/10.  Also added ball circles which patient tolerated well. OT Plan: P:  Resume missed ex due to time limits.   Goals Short Term Goals Time to Complete Short Term Goals: 3 weeks Short Term Goal 1: Patient will be educated on HEP. Short Term Goal 1 Progress: Met Short Term Goal 2: Patient will increase PROM in right shoulder to WNL for increased ability to reach into overhead cabinets. Short Term Goal 2 Progress: Progressing toward goal Short Term Goal 3: Patient will increase right shoulder flexors and abductors to 4+/5 strength for increased ability to lift tools when working around the house. Short Term Goal 3 Progress: Progressing toward goal Short Term Goal 4: Patient will decrease fascial restrictions in right shoulder to minimal. Short Term Goal 4 Progress: Progressing toward goal Short Term Goal 5: Patient will decrease pain in his right shoulder to 2/10 when reaching out to the side to grab the television remote. Short Term Goal 5 Progress: Progressing toward goal Long Term Goals Time to Complete Long Term Goals: 6 weeks Long Term Goal 1: Patient will return to prior level of I with all B/IADLs and leisure activities, using his right arm as dominant. Long Term Goal 1 Progress: Progressing toward goal Long Term Goal 2: Patient will increase right shoulder AROM to [facility] for increased ability to reach into overhead cabinets. Long Term Goal 2 Progress: Progressing toward goal Long Term Goal 3: Patient will increase right shoulder strength to 5/5 for increased ability to lift bags of groceries onto the countertop. Long Term Goal 3 Progress: Progressing toward goal Long Term Goal 4: Patient will decrease pain in right shoulder to 1/10 when working around the house. Long Term  Goal 4 Progress: Progressing toward goal Long Term  Goal 5: Patient will decrease fascial restrictions to trace in his right shoulder region. Long Term Goal 5 Progress: Progressing toward goal  Problem List Patient Active Problem List  Diagnosis  . DIABETES MELLITUS-TYPE II  . HYPERLIPIDEMIA  . HYPERTENSION  . GERD  . HEMATOCHEZIA  . SHOULDER, ARTHRITIS, DEGEN./OSTEO  . KNEE, ARTHRITIS, DEGEN./OSTEO  . DERANGEMENT MENISCUS  . SHOULDER PAIN  . BACK PAIN  . TENDINITIS, CALCIFIC, SHOULDER, LEFT  . IMPINGEMENT SYNDROME  . RUPTURE ROTATOR CUFF  . OA (osteoarthritis) of knee  . Chronic pain  . CTS (carpal tunnel syndrome)  . Foreign body of thumb, right  . Quadriceps tendon rupture  . Rupture quadriceps tendon  . Difficulty in walking  . Stiffness of joint, not elsewhere classified, lower leg  . Knee pain  . Rotator cuff tear, right  . Partial nontraumatic rupture of rotator cuff  . Shoulder arthritis  . Muscle weakness (generalized)    End of Session Activity Tolerance: Patient tolerated treatment well General Behavior During Session: [facility] for tasks performed Cognition: [facility] for tasks performed  GO    Thera Flake, Royer Cristobal L 05/31/2012, 12:06 PM

## 2012-06-03 ENCOUNTER — Ambulatory Visit (HOSPITAL_COMMUNITY)
Admission: RE | Admit: 2012-06-03 | Discharge: 2012-06-03 | Disposition: A | Discharge status: Home / Self Care | Payer: Medicare Other | Source: Ambulatory Visit | Attending: Orthopedic Surgery | Admitting: Orthopedic Surgery

## 2012-06-03 DIAGNOSIS — M6281 Muscle weakness (generalized): Secondary | ICD-10-CM

## 2012-06-03 NOTE — Progress Notes (Signed)
Occupational Therapy Treatment Patient Details  Name: [name] [name] MRN: [id number] Date of Birth: 1951/02/12  Today's Date: 06/03/2012 Time: [id number] OT Time Calculation (min): 38 min Manual Therapy [phone] 14' Therapeutic Exercise [phone] 23'  Visit#: 4  of 12   Re-eval: 06/17/12    Authorization: Medicare - Dr. Ruthe Mannan order states that he has 10 visits   Authorization Time Period: before 10th visit   Authorization Visit#: 4  of 10   Subjective Symptoms/Limitations Symptoms: S:  Ive been doing the exercises, they hurt a little bit but I do think they are helping.   Pain Assessment Currently in Pain?: No/denies  Precautions/Restrictions     Exercise/Treatments Supine Protraction: PROM;AROM;12 reps Horizontal ABduction: PROM;AROM;12 reps External Rotation: PROM;AROM;12 reps Internal Rotation: PROM;AROM;12 reps Flexion: PROM;AROM;12 reps ABduction: PROM;AROM;12 reps Seated Elevation:  (switched to tband) Protraction: AAROM;12 reps Horizontal ABduction: AAROM;12 reps External Rotation: AAROM;12 reps Internal Rotation: AAROM;12 reps Flexion: AAROM;12 reps Abduction: AAROM;12 reps Standing Extension: Theraband;10 reps Theraband Level (Shoulder Extension): Level 2 (Red) Row: Theraband;10 reps Theraband Level (Shoulder Row): Level 2 (Red) Retraction: Theraband;10 reps Theraband Level (Shoulder Retraction): Level 2 (Red) Therapy Ball Flexion: 20 reps ABduction: 20 reps Right/Left: 5 reps ROM / Strengthening / Isometric Strengthening UBE (Upper Arm Bike): begin next session Wall Wash: 2' Thumb Tacks: 1      Manual Therapy Manual Therapy: Myofascial release Myofascial Release: MFR and manual stretching to right anterior shoulder/pectoral region, upper arm, posterior shoulder and scapular region to decrease pain and restrictions and increase pain free mobility   Occupational Therapy Assessment and Plan OT Assessment and Plan Clinical Impression  Statement: A:  Switched scapular strengthening to tband strengthening.  Increase time on wall wash fatigued patient. OT Plan: P: Begin UBE   Goals Short Term Goals Time to Complete Short Term Goals: 3 weeks Short Term Goal 1: Patient will be educated on HEP. Short Term Goal 2: Patient will increase PROM in right shoulder to WNL for increased ability to reach into overhead cabinets. Short Term Goal 3: Patient will increase right shoulder flexors and abductors to 4+/5 strength for increased ability to lift tools when working around the house. Short Term Goal 4: Patient will decrease fascial restrictions in right shoulder to minimal. Short Term Goal 5: Patient will decrease pain in his right shoulder to 2/10 when reaching out to the side to grab the television remote. Long Term Goals Time to Complete Long Term Goals: 6 weeks Long Term Goal 1: Patient will return to prior level of I with all B/IADLs and leisure activities, using his right arm as dominant. Long Term Goal 2: Patient will increase right shoulder AROM to [facility] for increased ability to reach into overhead cabinets. Long Term Goal 3: Patient will increase right shoulder strength to 5/5 for increased ability to lift bags of groceries onto the countertop. Long Term Goal 4: Patient will decrease pain in right shoulder to 1/10 when working around the house. Long Term Goal 5: Patient will decrease fascial restrictions to trace in his right shoulder region.  Problem List Patient Active Problem List  Diagnosis  . DIABETES MELLITUS-TYPE II  . HYPERLIPIDEMIA  . HYPERTENSION  . GERD  . HEMATOCHEZIA  . SHOULDER, ARTHRITIS, DEGEN./OSTEO  . KNEE, ARTHRITIS, DEGEN./OSTEO  . DERANGEMENT MENISCUS  . SHOULDER PAIN  . BACK PAIN  . TENDINITIS, CALCIFIC, SHOULDER, LEFT  . IMPINGEMENT SYNDROME  . RUPTURE ROTATOR CUFF  . OA (osteoarthritis) of knee  . Chronic pain  . CTS (carpal  tunnel syndrome)  . Foreign body of thumb, right  . Quadriceps  tendon rupture  . Rupture quadriceps tendon  . Difficulty in walking  . Stiffness of joint, not elsewhere classified, lower leg  . Knee pain  . Rotator cuff tear, right  . Partial nontraumatic rupture of rotator cuff  . Shoulder arthritis  . Muscle weakness (generalized)    End of Session Activity Tolerance: Patient tolerated treatment well General Behavior During Session: [facility] for tasks performed Cognition: [facility] for tasks performed  GO    Thera Flake, Erynne Kealey L 06/03/2012, 10:15 AM

## 2012-06-04 ENCOUNTER — Encounter: Payer: Self-pay | Admitting: Orthopedic Surgery

## 2012-06-04 ENCOUNTER — Ambulatory Visit (INDEPENDENT_AMBULATORY_CARE_PROVIDER_SITE_OTHER): Payer: Medicare Other | Admitting: Orthopedic Surgery

## 2012-06-04 ENCOUNTER — Ambulatory Visit (INDEPENDENT_AMBULATORY_CARE_PROVIDER_SITE_OTHER): Payer: Medicare Other

## 2012-06-04 VITALS — Ht 64.0 in | Wt 233.0 lb

## 2012-06-04 DIAGNOSIS — M25569 Pain in unspecified knee: Secondary | ICD-10-CM

## 2012-06-04 MED ORDER — HYDROCODONE-ACETAMINOPHEN 10-325 MG PO TABS: 1.0000 | ORAL_TABLET | Freq: Four times a day (QID) | ORAL | Status: DC | PRN

## 2012-06-04 NOTE — Patient Instructions (Addendum)
Continue therapy on shoulder and foot

## 2012-06-04 NOTE — Progress Notes (Signed)
Patient ID: [name] [name], male   DOB: 1951-05-20, 61 y.o.   MRN: [id number] Chief Complaint  Patient presents with  . Follow-up    6 month recheck on left knee.    ROS: [facility] : 2nd opinion left foot tendonitis/ wear boot and therapy x 6 weeks   Right shoulder therapy: good and bad days   Left knee: OA, xrays show moderate OA   Physical Exam  Nursing note reviewed. Constitutional: He is oriented to person, place, and time. He appears well-developed and well-nourished.  Neck: Neck supple. No tracheal deviation present.  Cardiovascular: Intact distal pulses.   Pulmonary/Chest: No stridor.  Musculoskeletal:       RT SHOULDER ROM  IMPROVED FLEXION  AND ABDUCTION O INSTABILITY  MILD IMPINGEMENT   Lymphadenopathy:    He has no cervical adenopathy.  Neurological: He is alert and oriented to person, place, and time.  Skin: Skin is warm and dry.  Psychiatric: He has a normal mood and affect. His behavior is normal.      Today's x-ray shows moderate arthrosis in the LEFT knee joint. Mild varus deformity  rec xrays in 1 years   Continue therapy shoulder and knee   F/u after PT right shoulder   Refill norco 10/325 90 and 5 refills

## 2012-06-05 ENCOUNTER — Ambulatory Visit (HOSPITAL_COMMUNITY)
Admission: RE | Admit: 2012-06-05 | Discharge: 2012-06-05 | Disposition: A | Discharge status: Home / Self Care | Payer: Medicare Other | Source: Ambulatory Visit | Attending: Orthopedic Surgery | Admitting: Orthopedic Surgery

## 2012-06-05 DIAGNOSIS — M6281 Muscle weakness (generalized): Secondary | ICD-10-CM

## 2012-06-05 NOTE — Progress Notes (Signed)
Occupational Therapy Treatment Patient Details  Name: SUMMIT GROMEK MRN: [id number] Date of Birth: 19-Aug-1950  Today's Date: 06/05/2012 Time: [id number] OT Time Calculation (min): 47 min Manual Therapy [phone] 16' Therapeutic Exercise [phone] 30'  Visit#: 5  of 12   Re-eval: 06/17/12 Assessment Diagnosis: Right Partial Rotator Cuff Tear and AC Joint and Glenohumeral Joint Arthrosis  Authorization: Medicare - Dr. Ruthe Mannan order states that he has 10 visits   Authorization Time Period: before 10th visit   Authorization Visit#: 5  of 10   Subjective Symptoms/Limitations Symptoms: S: I've got a problem with my foot now and they want to do surgery.  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 Pain Assessment Currently in Pain?: Yes Pain Score:   4 Pain Location: Shoulder Pain Orientation: Right Pain Type: Acute pain  Precautions/Restrictions  Precautions Precautions: None  Exercise/Treatments Supine Protraction: PROM;AROM;15 reps Horizontal ABduction: PROM;AROM;15 reps External Rotation: PROM;AROM;15 reps Internal Rotation: PROM;AROM;15 reps Flexion: PROM;AROM;15 reps ABduction: PROM;AROM;15 reps Seated Protraction: AROM;10 reps Horizontal ABduction: AROM;10 reps External Rotation: AROM;10 reps Internal Rotation: AROM;10 reps Flexion: AROM;10 reps Abduction: AROM;10 reps Standing Extension: Theraband;12 reps Theraband Level (Shoulder Extension): Level 2 (Red) Row: Theraband;12 reps Theraband Level (Shoulder Row): Level 2 (Red) Retraction: Theraband;12 reps Theraband Level (Shoulder Retraction): Level 2 (Red)    Therapy Ball Flexion: Other (comment) (d/c) ABduction: Other (comment) (d/c) Right/Left: 5 reps ROM / Strengthening / Isometric Strengthening UBE (Upper Arm Bike): 3' forward 3' reverse 1.0 Wall Wash: 3' Thumb Tacks: 1 Proximal Shoulder Strengthening, Supine: 15 reps Prot/Ret//Elev/Dep: 1'      Manual Therapy Manual Therapy: Myofascial  release Myofascial Release: MFR and manual stretching to right anterior shoulder/pectoral region, upper arm, posterior shoulder and scapular region to decrease pain and restrictions and increase pain free mobility   Occupational Therapy Assessment and Plan OT Assessment and Plan Clinical Impression Statement: A:  Added UBE which patient tolerated well with no increase in fatigue. OT Plan: P:  Increase tband to green to continue to increase strength.   Goals Short Term Goals Time to Complete Short Term Goals: 3 weeks Short Term Goal 1: Patient will be educated on HEP. Short Term Goal 1 Progress: Met Short Term Goal 2: Patient will increase PROM in right shoulder to WNL for increased ability to reach into overhead cabinets. Short Term Goal 2 Progress: Met Short Term Goal 3: Patient will increase right shoulder flexors and abductors to 4+/5 strength for increased ability to lift tools when working around the house. Short Term Goal 3 Progress: Progressing toward goal Short Term Goal 4: Patient will decrease fascial restrictions in right shoulder to minimal. Short Term Goal 4 Progress: Progressing toward goal Short Term Goal 5: Patient will decrease pain in his right shoulder to 2/10 when reaching out to the side to grab the television remote. Short Term Goal 5 Progress: Progressing toward goal Long Term Goals Time to Complete Long Term Goals: 6 weeks Long Term Goal 1: Patient will return to prior level of I with all B/IADLs and leisure activities, using his right arm as dominant. Long Term Goal 1 Progress: Progressing toward goal Long Term Goal 2: Patient will increase right shoulder AROM to [facility] for increased ability to reach into overhead cabinets. Long Term Goal 2 Progress: Met Long Term Goal 3: Patient will increase right shoulder strength to 5/5 for increased ability to lift bags of groceries onto the countertop. Long Term Goal 3 Progress: Progressing toward goal Long Term Goal 4:  Patient will  decrease pain in right shoulder to 1/10 when working around the house. Long Term Goal 5: Patient will decrease fascial restrictions to trace in his right shoulder region.  Problem List Patient Active Problem List  Diagnosis  . DIABETES MELLITUS-TYPE II  . HYPERLIPIDEMIA  . HYPERTENSION  . GERD  . HEMATOCHEZIA  . SHOULDER, ARTHRITIS, DEGEN./OSTEO  . KNEE, ARTHRITIS, DEGEN./OSTEO  . DERANGEMENT MENISCUS  . SHOULDER PAIN  . BACK PAIN  . TENDINITIS, CALCIFIC, SHOULDER, LEFT  . IMPINGEMENT SYNDROME  . RUPTURE ROTATOR CUFF  . OA (osteoarthritis) of knee  . Chronic pain  . CTS (carpal tunnel syndrome)  . Foreign body of thumb, right  . Quadriceps tendon rupture  . Rupture quadriceps tendon  . Difficulty in walking  . Stiffness of joint, not elsewhere classified, lower leg  . Knee pain  . Rotator cuff tear, right  . Partial nontraumatic rupture of rotator cuff  . Shoulder arthritis  . Muscle weakness (generalized)    End of Session Activity Tolerance: Patient tolerated treatment well General Behavior During Session: [facility] for tasks performed Cognition: [facility] for tasks performed  GO    Thera Flake, Estellar Cadena L 06/05/2012, 11:53 AM

## 2012-06-06 ENCOUNTER — Ambulatory Visit (HOSPITAL_COMMUNITY)
Admission: RE | Admit: 2012-06-06 | Discharge: 2012-06-06 | Disposition: A | Discharge status: Home / Self Care | Payer: Medicare Other | Source: Ambulatory Visit | Attending: Physical Therapy | Admitting: Physical Therapy

## 2012-06-06 ENCOUNTER — Ambulatory Visit (HOSPITAL_COMMUNITY)
Admission: RE | Admit: 2012-06-06 | Discharge: 2012-06-06 | Disposition: A | Discharge status: Home / Self Care | Payer: Medicare Other | Source: Ambulatory Visit | Attending: Orthopedic Surgery | Admitting: Orthopedic Surgery

## 2012-06-06 DIAGNOSIS — M6281 Muscle weakness (generalized): Secondary | ICD-10-CM

## 2012-06-06 NOTE — Progress Notes (Signed)
Occupational Therapy Treatment Patient Details  Name: [name] [name] MRN: [id number] Date of Birth: 06-09-1951  Today's Date: 06/06/2012 Time: [id number] OT Time Calculation (min): 51 min Manual Therapy [phone] 23' Therapeutic Exercise [phone] 27'  Visit#: 6  of 12   Re-eval: 06/17/12 Assessment Diagnosis: Right Partial Rotator Cuff Tear and AC Joint and Glenohumeral Joint Arthrosis Next MD Visit: 06/04/12  Authorization:    Authorization Time Period: Medicare - Dr. Ruthe Mannan order states that he has 10 visits  Authorization Visit#: 6  of 10   Subjective Symptoms/Limitations Symptoms: S:  I was cleaning up around my cousin's house, she had some old lawnmowers she needed to get moved.  Precautions/Restrictions  Precautions Precautions: None  Exercise/Treatments Supine Protraction: PROM;Strengthening;10 reps Protraction Weight (lbs): 1 Horizontal ABduction: PROM;Strengthening;10 reps Horizontal ABduction Weight (lbs): 1 External Rotation: PROM;Strengthening;10 reps External Rotation Weight (lbs): 1 Internal Rotation: PROM;Strengthening;10 reps Internal Rotation Weight (lbs): 1 Flexion: PROM;Strengthening;10 reps Shoulder Flexion Weight (lbs): 1 ABduction: PROM;Strengthening;10 reps Shoulder ABduction Weight (lbs): 1 Seated Protraction: AROM;12 reps Horizontal ABduction: AROM;12 reps External Rotation: AROM;12 reps Internal Rotation: AROM;12 reps Flexion: AROM;12 reps Abduction: AROM;12 reps Standing Extension: Theraband;10 reps Theraband Level (Shoulder Extension): Level 3 (Green) Row: Theraband;10 reps Theraband Level (Shoulder Row): Level 3 (Green) Retraction: Theraband;10 reps Theraband Level (Shoulder Retraction): Level 3 (Green) Therapy Ball Right/Left: 5 reps ROM / Strengthening / Isometric Strengthening UBE (Upper Arm Bike): 3' forward 3' reverse 1.5 Wall Wash: 4' Thumb Tacks: 1 Proximal Shoulder Strengthening, Supine: 20 Prot/Ret//Elev/Dep:  1        Manual Therapy Manual Therapy: Myofascial release Myofascial Release: MFR and manual stretching to right anterior shoulder/pectoral region, upper arm, posterior shoulder and scapular region to decrease pain and restrictions and increase pain free mobility   Occupational Therapy Assessment and Plan OT Assessment and Plan Clinical Impression Statement: A:  Added 1# to supine exercises which patient tolerated well, increased wall wash to 4' which fatigued patient also increased tband to green. Rehab Potential: Good OT Plan: P:  Increase reps with supine exercises and add 1# to seated ex.   Goals Short Term Goals Time to Complete Short Term Goals: 3 weeks Short Term Goal 1: Patient will be educated on HEP. Short Term Goal 2: Patient will increase PROM in right shoulder to WNL for increased ability to reach into overhead cabinets. Short Term Goal 3: Patient will increase right shoulder flexors and abductors to 4+/5 strength for increased ability to lift tools when working around the house. Short Term Goal 4: Patient will decrease fascial restrictions in right shoulder to minimal. Short Term Goal 5: Patient will decrease pain in his right shoulder to 2/10 when reaching out to the side to grab the television remote. Long Term Goals Time to Complete Long Term Goals: 6 weeks Long Term Goal 1: Patient will return to prior level of I with all B/IADLs and leisure activities, using his right arm as dominant. Long Term Goal 2: Patient will increase right shoulder AROM to [facility] for increased ability to reach into overhead cabinets. Long Term Goal 3: Patient will increase right shoulder strength to 5/5 for increased ability to lift bags of groceries onto the countertop. Long Term Goal 4: Patient will decrease pain in right shoulder to 1/10 when working around the house. Long Term Goal 5: Patient will decrease fascial restrictions to trace in his right shoulder region.  Problem List Patient  Active Problem List  Diagnosis  . DIABETES MELLITUS-TYPE II  . HYPERLIPIDEMIA  .  HYPERTENSION  . GERD  . HEMATOCHEZIA  . SHOULDER, ARTHRITIS, DEGEN./OSTEO  . KNEE, ARTHRITIS, DEGEN./OSTEO  . DERANGEMENT MENISCUS  . SHOULDER PAIN  . BACK PAIN  . TENDINITIS, CALCIFIC, SHOULDER, LEFT  . IMPINGEMENT SYNDROME  . RUPTURE ROTATOR CUFF  . OA (osteoarthritis) of knee  . Chronic pain  . CTS (carpal tunnel syndrome)  . Foreign body of thumb, right  . Quadriceps tendon rupture  . Rupture quadriceps tendon  . Difficulty in walking  . Stiffness of joint, not elsewhere classified, lower leg  . Knee pain  . Rotator cuff tear, right  . Partial nontraumatic rupture of rotator cuff  . Shoulder arthritis  . Muscle weakness (generalized)    End of Session Activity Tolerance: Patient tolerated treatment well General Behavior During Session: [facility] for tasks performed Cognition: [facility] for tasks performed  GO    Thera Flake, Zairah Arista L 06/06/2012, 10:31 AM

## 2012-06-06 NOTE — Evaluation (Signed)
Physical Therapy Evaluation  Patient Details  Name: [name] [name] MRN: [id number] Date of Birth: Feb 09, 1951  Today's Date: 06/06/2012 Time: 0850-0930 PT Time Calculation (min): 40 min  Visit#: 1  of 12   Re-eval: 07/06/12 Assessment Diagnosis: L tendonitis Next MD Visit: 07/07/2012 Prior Therapy: none  Authorization: [employer]    Authorization Visit#: 1  of 10    Past Medical History:  Past Medical History  Diagnosis Date  . COPD (chronic obstructive pulmonary disease)   . Sinusitis   . PUD (peptic ulcer disease)     remote, reports f/u EGD about 8 years ago unremarkable   . Diabetes   . GERD (gastroesophageal reflux disease)   . Hyperlipidemia   . Hypertension   . Sleep apnea   . Post traumatic stress disorder   . Anxiety   . Depression   . Bell palsy    Past Surgical History:  Past Surgical History  Procedure Date  . Asad lt shoulder 12/2008    left shoulder  . Umbilical hernia repair [id number]    [place]  . Knee arthroscopy 10/2007    left  . Toenail excision     removed x2-bilateral  . Eye surgery 12/22/2010    tear duct probing-[place]  . Foreign body removal 03/29/2011    Procedure: REMOVAL FOREIGN BODY EXTREMITY;  Surgeon: Arther Abbott, MD;  Location: [facility];  Service: Orthopedics;  Laterality: Right;  Removal Foreign Body Right Thumb  . Quadriceps tendon repair 07/21/2011    Procedure: REPAIR QUADRICEP TENDON;  Surgeon: Arther Abbott, MD;  Location: [facility];  Service: Orthopedics;  Laterality: Right;    Subjective Symptoms/Limitations Symptoms: [name] [name] states that he has been experiencing increased pain in his L foot when ambulating for about six months.  He went to the MD who injected his foot two times but did not give him any relief.  He has been place in a cam boot since Monday and this has helped 60%.  He is now being referred for therapy to help decrease his pain and improve his functional mobility. How long can you sit comfortably?:  sitting  How long can you stand comfortably?: without the boot immediate; with the boot able to stand for 30 minutes. How long can you walk comfortably?: without the boot his foot would hurt immediately and the longer he'd walk the worst the pain.  He could bear 30 minutes.  With wth boot walkiing has decreased to a 2/10 Repetition: Increases Symptoms Pain Assessment Currently in Pain?: Yes Pain Score:   2 (2/10 with boot; without boot pain will go to 10/10) Pain Location: Heel Pain Orientation: Left Pain Type: Chronic pain Pain Radiating Towards: achilles tendon Pain Onset: More than a month ago Pain Frequency: Intermittent Pain Relieving Factors: camboot;     Prior Functioning  Home Living Lives With: Spouse Prior Function Level of Independence: Independent with basic ADLs Vocation: On disability Leisure: Hobbies-yes (Comment) Comments: fish  Cognition/Observation Cognition Overall Cognitive Status: Appears within functional limits for tasks assessed Observation/Other Assessments Observations: flat foot with supination    Assessment LLE AROM (degrees) Left Ankle Dorsiflexion: 10  Left Ankle Plantar Flexion: 50  Left Ankle Inversion: 30  Left Ankle Eversion: 12  LLE Strength Left Ankle Dorsiflexion: 3+/5 Left Ankle Plantar Flexion: 3+/5 Left Ankle Inversion: 3+/5 Left Ankle Eversion: 3+/5 Palpation Palpation: tight gastroc mm  Exercise/Treatments     Ankle Stretches Plantar Fascia Stretch: 3 reps;30 seconds Gastroc Stretch: 3 reps;30 seconds   Manual  Therapy Manual Therapy: Massage Massage: to gastroc mm w/ friction massage to achilles tendon to promote circulation to tendon  Physical Therapy Assessment and Plan PT Assessment and Plan Clinical Impression Statement: Pt with tight and weak ankle mm who will benefit from skilled PT for progressive exericses to return to prior functional level. Pt will benefit from skilled therapeutic intervention in order to  improve on the following deficits: Pain;Decreased activity tolerance;Difficulty walking;Decreased strength;Increased fascial restricitons Rehab Potential: Good PT Frequency: Min 2X/week PT Duration: 6 weeks PT Treatment/Interventions: Gait training;Modalities;Patient/family education;Manual techniques PT Plan: begin slant board stretch, soleus stretch and toe raises next treatment;  No heel raises as gastroc is already tight.  May add [place] to tendon to promote increased circulation.  Begin weaning pt off cam boot after 3-4 weeks.  Goals Home Exercise Program Pt will Perform Home Exercise Program: Independently PT Short Term Goals Time to Complete Short Term Goals: 3 weeks PT Short Term Goal 1: Pain to be a 0 in the Cam boot. PT Long Term Goals Time to Complete Long Term Goals:  (6 weeks) PT Long Term Goal 1: Pt to be weaning out of CAM PT Long Term Goal 2: Strength to be 5/5 to allow walking without cam boot for an hour without pain Long Term Goal 3: Pt to obtain a foot orthotic.  Problem List Patient Active Problem List  Diagnosis  . DIABETES MELLITUS-TYPE II  . HYPERLIPIDEMIA  . HYPERTENSION  . GERD  . HEMATOCHEZIA  . SHOULDER, ARTHRITIS, DEGEN./OSTEO  . KNEE, ARTHRITIS, DEGEN./OSTEO  . DERANGEMENT MENISCUS  . SHOULDER PAIN  . BACK PAIN  . TENDINITIS, CALCIFIC, SHOULDER, LEFT  . IMPINGEMENT SYNDROME  . RUPTURE ROTATOR CUFF  . OA (osteoarthritis) of knee  . Chronic pain  . CTS (carpal tunnel syndrome)  . Foreign body of thumb, right  . Quadriceps tendon rupture  . Rupture quadriceps tendon  . Difficulty in walking  . Stiffness of joint, not elsewhere classified, lower leg  . Knee pain  . Rotator cuff tear, right  . Partial nontraumatic rupture of rotator cuff  . Shoulder arthritis  . Muscle weakness (generalized)    PT - End of Session Activity Tolerance: Patient tolerated treatment well General Behavior During Session: [facility] for tasks performed PT Plan of Care PT  Home Exercise Plan: given Consulted and Agree with Plan of Care: Patient  GP Functional Assessment Tool Used: LEFS Functional Limitation: Mobility: Walking and moving around Mobility: Walking and Moving Around Current Status [id number]): At least 40 percent but less than 60 percent impaired, limited or restricted Mobility: Walking and Moving Around Goal Status [phone]): At least 1 percent but less than 20 percent impaired, limited or restricted  Harmonie Verrastro,CINDY 06/06/2012, 9:45 AM  Physician Documentation Your signature is required to indicate approval of the treatment plan as stated above.  Please sign and either send electronically or make a copy of this report for your files and return this physician signed original.   Please mark one 1.__approve of plan  2. ___approve of plan with the following conditions.   ______________________________                                                          _____________________ Physician Signature  Date  

## 2012-06-10 ENCOUNTER — Ambulatory Visit (HOSPITAL_COMMUNITY)
Admission: RE | Admit: 2012-06-10 | Discharge: 2012-06-10 | Disposition: A | Discharge status: Home / Self Care | Payer: Medicare Other | Source: Ambulatory Visit | Attending: *Deleted | Admitting: *Deleted

## 2012-06-10 DIAGNOSIS — M6281 Muscle weakness (generalized): Secondary | ICD-10-CM

## 2012-06-10 NOTE — Progress Notes (Signed)
Occupational Therapy Treatment Patient Details  Name: [name] [name] MRN: [id number] Date of Birth: 1951/02/25  Today's Date: 06/10/2012 Time: [id number] OT Time Calculation (min): 57 min Manual Therapy [phone] 19' Therapeutic Exercise [phone] 37'  Visit#: 7  of 12   Re-eval: 06/17/12    Authorization: Medicare - Dr. Ruthe Mannan order states that he has 10 visits   Authorization Time Period: Medicare - Dr. Ruthe Mannan order states that he has 10 visits   Authorization Visit#: 7  of 10   Subjective Symptoms/Limitations Symptoms: S: I worked outside on the [place] some yesterday. Pain Assessment Currently in Pain?: Yes Pain Score:   3 Pain Location: Shoulder  Exercise/Treatments Supine Protraction: PROM;Strengthening;12 reps Protraction Weight (lbs): 1 Horizontal ABduction: PROM;Strengthening;12 reps Horizontal ABduction Weight (lbs): 1 External Rotation: PROM;Strengthening;12 reps External Rotation Weight (lbs): 1 Internal Rotation: PROM;Strengthening;12 reps Internal Rotation Weight (lbs): 1 Flexion: PROM;Strengthening;12 reps Shoulder Flexion Weight (lbs): 1 ABduction: PROM;Strengthening;12 reps Shoulder ABduction Weight (lbs): 1 Seated Protraction: Strengthening;10 reps Protraction Weight (lbs): 1 Horizontal ABduction: Strengthening;10 reps Horizontal ABduction Weight (lbs): 1 External Rotation: Strengthening;10 reps External Rotation Weight (lbs): 1 Internal Rotation: Strengthening;10 reps Internal Rotation Weight (lbs): 1 Flexion: Strengthening;10 reps Flexion Weight (lbs): 1 Abduction: Strengthening;10 reps ABduction Weight (lbs): 1 Standing Extension: Theraband;12 reps Theraband Level (Shoulder Extension): Level 3 (Green) Row: Theraband;12 reps Theraband Level (Shoulder Row): Level 3 (Green) Retraction: Theraband;12 reps Theraband Level (Shoulder Retraction): Level 3 (Green) Therapy Ball Right/Left: 5 reps ROM / Strengthening / Isometric  Strengthening UBE (Upper Arm Bike): 3' forward 3' reverse 2.0 Wall Wash: 4' Thumb Tacks: 1 "W" Arms: add next session X to V Arms: add next session Proximal Shoulder Strengthening, Supine: switch to seated Proximal Shoulder Strengthening, Seated: 15 Prot/Ret//Elev/Dep: 1     Manual Therapy Manual Therapy: Myofascial release Myofascial Release: MFR and manual stretching to right anterior shoulder/pectoral region, upper arm, posterior shoulder and scapular region to decrease pain and restrictions and increase pain free mobility   Occupational Therapy Assessment and Plan OT Assessment and Plan Clinical Impression Statement: A:  Kept wall wash at 4' with no weight secondary to fatigue with 4' and not ready for weight.  Added 1# to seated ex and changed proximal shoulder strengthening to seated.  Pt. tolerated all changes well. OT Plan: P:   Add x to v and w arms at next session.   Goals Short Term Goals Time to Complete Short Term Goals: 3 weeks Short Term Goal 1: Patient will be educated on HEP. Short Term Goal 2: Patient will increase PROM in right shoulder to WNL for increased ability to reach into overhead cabinets. Short Term Goal 3: Patient will increase right shoulder flexors and abductors to 4+/5 strength for increased ability to lift tools when working around the house. Short Term Goal 4: Patient will decrease fascial restrictions in right shoulder to minimal. Short Term Goal 5: Patient will decrease pain in his right shoulder to 2/10 when reaching out to the side to grab the television remote. Long Term Goals Time to Complete Long Term Goals: 6 weeks Long Term Goal 1: Patient will return to prior level of I with all B/IADLs and leisure activities, using his right arm as dominant. Long Term Goal 2: Patient will increase right shoulder AROM to [facility] for increased ability to reach into overhead cabinets. Long Term Goal 3: Patient will increase right shoulder strength to 5/5 for  increased ability to lift bags of groceries onto the countertop. Long Term Goal 4: Patient will  decrease pain in right shoulder to 1/10 when working around the house. Long Term Goal 5: Patient will decrease fascial restrictions to trace in his right shoulder region.  Problem List Patient Active Problem List  Diagnosis  . DIABETES MELLITUS-TYPE II  . HYPERLIPIDEMIA  . HYPERTENSION  . GERD  . HEMATOCHEZIA  . SHOULDER, ARTHRITIS, DEGEN./OSTEO  . KNEE, ARTHRITIS, DEGEN./OSTEO  . DERANGEMENT MENISCUS  . SHOULDER PAIN  . BACK PAIN  . TENDINITIS, CALCIFIC, SHOULDER, LEFT  . IMPINGEMENT SYNDROME  . RUPTURE ROTATOR CUFF  . OA (osteoarthritis) of knee  . Chronic pain  . CTS (carpal tunnel syndrome)  . Foreign body of thumb, right  . Quadriceps tendon rupture  . Rupture quadriceps tendon  . Difficulty in walking  . Stiffness of joint, not elsewhere classified, lower leg  . Knee pain  . Rotator cuff tear, right  . Partial nontraumatic rupture of rotator cuff  . Shoulder arthritis  . Muscle weakness (generalized)    End of Session Activity Tolerance: Patient tolerated treatment well General Behavior During Session: [facility] for tasks performed Cognition: [facility] for tasks performed  GO    Shunda Rabadi L. [place], COTA/L  06/10/2012, 10:29 AM

## 2012-06-12 ENCOUNTER — Ambulatory Visit (HOSPITAL_COMMUNITY)
Admission: RE | Admit: 2012-06-12 | Discharge: 2012-06-12 | Disposition: A | Discharge status: Home / Self Care | Payer: Medicare Other | Source: Ambulatory Visit | Attending: Orthopedic Surgery | Admitting: Orthopedic Surgery

## 2012-06-12 DIAGNOSIS — M6281 Muscle weakness (generalized): Secondary | ICD-10-CM

## 2012-06-12 NOTE — Progress Notes (Signed)
Occupational Therapy Treatment Patient Details  Name: [name] [name] MRN: [id number] Date of Birth: [birth year]  Today's Date: 06/12/2012 Time: [id number] OT Time Calculation (min): 54 min Manual Therapy [id number] 16' Therapeutic Exercise [phone] 37'  Visit#: 8  of 12   Re-eval: 06/17/12    Authorization: Medicare - Dr. Ruthe Mannan order states that he has 10 visits   Authorization Time Period: Medicare - Dr. Ruthe Mannan order states that he has 10 visits   Authorization Visit#: 8  of 10   Subjective Symptoms/Limitations Symptoms: S:  My shoulder feels pretty good. Pain Assessment Pain Score:   1 Pain Location: Shoulder Pain Orientation: Right;Left Pain Type: Chronic pain  Precautions/Restrictions     Exercise/Treatments Supine Protraction: PROM;Strengthening;15 reps Protraction Weight (lbs): 1 Horizontal ABduction: PROM;Strengthening;15 reps Horizontal ABduction Weight (lbs): 1 External Rotation: PROM;Strengthening;15 reps External Rotation Weight (lbs): 1 Internal Rotation: PROM;Strengthening;15 reps Internal Rotation Weight (lbs): 1 Flexion: PROM;Strengthening;15 reps Shoulder Flexion Weight (lbs): 1 ABduction: PROM;Strengthening;15 reps Shoulder ABduction Weight (lbs): 1 Seated Protraction: Strengthening;12 reps Protraction Weight (lbs): 1 Horizontal ABduction: Strengthening;12 reps Horizontal ABduction Weight (lbs): 1 External Rotation: Strengthening;12 reps External Rotation Weight (lbs): 1 Internal Rotation: Strengthening;12 reps Internal Rotation Weight (lbs): 1 Flexion: Strengthening;12 reps Flexion Weight (lbs): 1 Abduction: Strengthening;12 reps ABduction Weight (lbs): 1 Standing Extension: Theraband;15 reps Theraband Level (Shoulder Extension): Level 3 (Green) Row: Theraband;15 reps Theraband Level (Shoulder Row): Level 3 (Green) Retraction: Theraband;15 reps Theraband Level (Shoulder Retraction): Level 3 (Green)    Therapy Ball Right/Left: 5  reps ROM / Strengthening / Isometric Strengthening UBE (Upper Arm Bike): 3' forward 3' reverse 2.0 Wall Wash: 2' with 1/2# Thumb Tacks: 1 "W" Arms: x10 X to V Arms: x10 Proximal Shoulder Strengthening, Seated: 15 Prot/Ret//Elev/Dep: 1         Manual Therapy Manual Therapy: Myofascial release Myofascial Release: MFR and manual stretching to right upper arm, upper trap and scapula to decrease restrictions and increase pain free AROM.  Occupational Therapy Assessment and Plan OT Assessment and Plan Clinical Impression Statement: A:  Added 1/2# to wall wash but decreased time to 2#. Also added x to v and w arms. OT Plan: P: Add tband to HEP to increase strengthening.   Goals Short Term Goals Time to Complete Short Term Goals: 3 weeks Short Term Goal 1: Patient will be educated on HEP. Short Term Goal 2: Patient will increase PROM in right shoulder to WNL for increased ability to reach into overhead cabinets. Short Term Goal 3: Patient will increase right shoulder flexors and abductors to 4+/5 strength for increased ability to lift tools when working around the house. Short Term Goal 4: Patient will decrease fascial restrictions in right shoulder to minimal. Short Term Goal 5: Patient will decrease pain in his right shoulder to 2/10 when reaching out to the side to grab the television remote. Long Term Goals Time to Complete Long Term Goals: 6 weeks Long Term Goal 1: Patient will return to prior level of I with all B/IADLs and leisure activities, using his right arm as dominant. Long Term Goal 2: Patient will increase right shoulder AROM to [facility] for increased ability to reach into overhead cabinets. Long Term Goal 3: Patient will increase right shoulder strength to 5/5 for increased ability to lift bags of groceries onto the countertop. Long Term Goal 4: Patient will decrease pain in right shoulder to 1/10 when working around the house. Long Term Goal 5: Patient will decrease fascial  restrictions to trace in his right shoulder  region.  Problem List Patient Active Problem List  Diagnosis  . DIABETES MELLITUS-TYPE II  . HYPERLIPIDEMIA  . HYPERTENSION  . GERD  . HEMATOCHEZIA  . SHOULDER, ARTHRITIS, DEGEN./OSTEO  . KNEE, ARTHRITIS, DEGEN./OSTEO  . DERANGEMENT MENISCUS  . SHOULDER PAIN  . BACK PAIN  . TENDINITIS, CALCIFIC, SHOULDER, LEFT  . IMPINGEMENT SYNDROME  . RUPTURE ROTATOR CUFF  . OA (osteoarthritis) of knee  . Chronic pain  . CTS (carpal tunnel syndrome)  . Foreign body of thumb, right  . Quadriceps tendon rupture  . Rupture quadriceps tendon  . Difficulty in walking  . Stiffness of joint, not elsewhere classified, lower leg  . Knee pain  . Rotator cuff tear, right  . Partial nontraumatic rupture of rotator cuff  . Shoulder arthritis  . Muscle weakness (generalized)    End of Session Activity Tolerance: Patient tolerated treatment well General Behavior During Session: [facility] for tasks performed Cognition: [facility] for tasks performed  GO    Thera Flake, Maddoxx Burkitt L 06/12/2012, 10:50 AM

## 2012-06-12 NOTE — Progress Notes (Signed)
Physical Therapy Treatment Patient Details  Name: [name] [name] MRN: [id number] Date of Birth: [birth year]  Today's Date: 06/12/2012 Time: [id number] PT Time Calculation (min): 41 min  Visit#: 2  of 12   Re-eval: 07/06/12 Charges: Therex x 15' Manual x 15' [place] x 8'  Authorization: [place] Visit#: 2  of 10    Subjective: Symptoms/Limitations Symptoms: Pt reports that he is no longer using his CAM boot. Pain Assessment Currently in Pain?: Yes Pain Score:   2 Pain Location: Ankle Pain Orientation: Posterior   Exercise/Treatments Ankle Stretches Plantar Fascia Stretch: 3 reps;30 seconds Soleus Stretch: 3 reps;30 seconds Slant Board Stretch: 3 reps;30 seconds Ankle Exercises - Standing Toe Raise: 10 reps Modalities Modalities: Ultrasound Manual Therapy Manual Therapy: Myofascial release Myofascial Release: To achilles tendon and throughout posterior calcaneus to decrease tightness and adhesions. Ultrasound Ultrasound Location: L achilles tendon Ultrasound Parameters: PULSED 1 w/cm2 @ 3 mhz Ultrasound Goals: Pain  Physical Therapy Assessment and Plan PT Assessment and Plan Clinical Impression Statement: Tx focus on decreasing tightness, fascial restrictions and pain. Began slant board and soleus stretch without difficulty after initial demonstration. Pt has most pain at bone on posterior lateral calcaneus. Began non-thermal [place] to achilles to decrease pain/tightness and facilitate healing. MFR completed to achilles and throughout posterior calcaneus. Multiple adhesions noted but were resolved with MFR. Pt reports 0/10 pain at end of session. Rehab Potential: Good PT Plan: Continue to decrease tightness and pain per PT POC.     Problem List Patient Active Problem List  Diagnosis  . DIABETES MELLITUS-TYPE II  . HYPERLIPIDEMIA  . HYPERTENSION  . GERD  . HEMATOCHEZIA  . SHOULDER, ARTHRITIS, DEGEN./OSTEO  . KNEE, ARTHRITIS, DEGEN./OSTEO  .  DERANGEMENT MENISCUS  . SHOULDER PAIN  . BACK PAIN  . TENDINITIS, CALCIFIC, SHOULDER, LEFT  . IMPINGEMENT SYNDROME  . RUPTURE ROTATOR CUFF  . OA (osteoarthritis) of knee  . Chronic pain  . CTS (carpal tunnel syndrome)  . Foreign body of thumb, right  . Quadriceps tendon rupture  . Rupture quadriceps tendon  . Difficulty in walking  . Stiffness of joint, not elsewhere classified, lower leg  . Knee pain  . Rotator cuff tear, right  . Partial nontraumatic rupture of rotator cuff  . Shoulder arthritis  . Muscle weakness (generalized)    PT - End of Session Activity Tolerance: Patient tolerated treatment well General Behavior During Session: [facility] for tasks performed Cognition: [facility] for tasks performed  GP Functional Assessment Tool Used: LEFS  Rachelle Hora, PTA 06/12/2012, 9:46 AM

## 2012-06-17 ENCOUNTER — Ambulatory Visit (HOSPITAL_COMMUNITY)
Admission: RE | Admit: 2012-06-17 | Discharge: 2012-06-17 | Disposition: A | Discharge status: Home / Self Care | Payer: Medicare Other | Source: Ambulatory Visit | Attending: Orthopedic Surgery | Admitting: Orthopedic Surgery

## 2012-06-17 DIAGNOSIS — X58XXXA Exposure to other specified factors, initial encounter: Secondary | ICD-10-CM | POA: Insufficient documentation

## 2012-06-17 DIAGNOSIS — Z5189 Encounter for other specified aftercare: Secondary | ICD-10-CM | POA: Insufficient documentation

## 2012-06-17 DIAGNOSIS — S43429A Sprain of unspecified rotator cuff capsule, initial encounter: Secondary | ICD-10-CM | POA: Insufficient documentation

## 2012-06-17 DIAGNOSIS — M6281 Muscle weakness (generalized): Secondary | ICD-10-CM

## 2012-06-17 NOTE — Progress Notes (Signed)
Occupational Therapy Treatment Patient Details  Name: [name] [name] MRN: [id number] Date of Birth: 02-Dec-1950  Today's Date: 06/17/2012 Time: [id number] OT Time Calculation (min): 43 min Manual Therapy [phone] 13' Therapeutic Exercises [phone]' Visit#: 9  of 12   Re-eval: 06/19/12    Authorization: Medicare - Dr. Ruthe Mannan order states that he has 10 visits   Authorization Time Period: Medicare - Dr. Ruthe Mannan order states that he has 10 visits   Authorization Visit#: 9  of 10   Subjective S:  Its doing pretty good. Pain Assessment Currently in Pain?: Yes Pain Score:   1 Pain Location: Shoulder Pain Orientation: Anterior;Right  Precautions/Restrictions   n/a  Exercise/Treatments Supine Protraction: PROM;Strengthening;10 reps Protraction Weight (lbs): 2 Horizontal ABduction: PROM;Strengthening;10 reps Horizontal ABduction Weight (lbs): 2 External Rotation: PROM;Strengthening;10 reps External Rotation Weight (lbs): 2 Internal Rotation: PROM;Strengthening;10 reps Internal Rotation Weight (lbs): 2 Flexion: PROM;Strengthening;10 reps Shoulder Flexion Weight (lbs): 2 ABduction: PROM;Strengthening;10 reps Shoulder ABduction Weight (lbs): 2 Seated Protraction: Strengthening;15 reps Protraction Weight (lbs): 1 Horizontal ABduction: Strengthening;15 reps Horizontal ABduction Weight (lbs): 1 External Rotation: Strengthening;15 reps External Rotation Weight (lbs): 1 Internal Rotation: Strengthening;15 reps Internal Rotation Weight (lbs): 1 Flexion: Strengthening;15 reps Flexion Weight (lbs): 1 Abduction: Strengthening;15 reps ABduction Weight (lbs): 1 Standing External Rotation: Theraband;15 reps Theraband Level (Shoulder External Rotation): Level 3 (Green) Internal Rotation: Theraband;15 reps Theraband Level (Shoulder Internal Rotation): Level 3 (Green) Extension: Theraband;15 reps Theraband Level (Shoulder Extension): Level 3 (Green) Row: Theraband;15  reps Theraband Level (Shoulder Row): Level 3 (Green) Retraction: Theraband;15 reps Theraband Level (Shoulder Retraction): Level 3 (Green) ROM / Strengthening / Isometric Strengthening UBE (Upper Arm Bike): 3' and 3' at 2.5 Wall Wash: resume next visit Thumb Tacks: dc "W" Arms: 15 with 1# X to V Arms: 15 with 1# Proximal Shoulder Strengthening, Seated: 15 x 1# Ball on Wall: 1' on flexion and 1' abduction Prot/Ret//Elev/Dep: dc      Manual Therapy Manual Therapy: Myofascial release Myofascial Release: MFR and manual stretching to right upper arm, anterior shoulder, scapular region to decrease pain and restrictions and increase pain free mobility.    Occupational Therapy Assessment and Plan OT Assessment and Plan Clinical Impression Statement: A:  Increased to 2# with strengthening in supine.  Added 1# to seated strengthening and scapular strengthening exercises.   OT Plan: P:  Add tband to HEP and add 2# to seated strengthening exercises.  REASSESS.     Goals Short Term Goals Time to Complete Short Term Goals: 3 weeks Short Term Goal 1: Patient will be educated on HEP. Short Term Goal 2: Patient will increase PROM in right shoulder to WNL for increased ability to reach into overhead cabinets. Short Term Goal 3: Patient will increase right shoulder flexors and abductors to 4+/5 strength for increased ability to lift tools when working around the house. Short Term Goal 4: Patient will decrease fascial restrictions in right shoulder to minimal. Short Term Goal 4 Progress: Progressing toward goal Short Term Goal 5: Patient will decrease pain in his right shoulder to 2/10 when reaching out to the side to grab the television remote. Short Term Goal 5 Progress: Progressing toward goal Long Term Goals Time to Complete Long Term Goals: 6 weeks Long Term Goal 1: Patient will return to prior level of I with all B/IADLs and leisure activities, using his right arm as dominant. Long Term Goal 1  Progress: Progressing toward goal Long Term Goal 2: Patient will increase right shoulder AROM to [facility] for increased  ability to reach into overhead cabinets. Long Term Goal 2 Progress: Progressing toward goal Long Term Goal 3: Patient will increase right shoulder strength to 5/5 for increased ability to lift bags of groceries onto the countertop. Long Term Goal 3 Progress: Progressing toward goal Long Term Goal 4: Patient will decrease pain in right shoulder to 1/10 when working around the house. Long Term Goal 4 Progress: Progressing toward goal Long Term Goal 5: Patient will decrease fascial restrictions to trace in his right shoulder region. Long Term Goal 5 Progress: Progressing toward goal  Problem List Patient Active Problem List  Diagnosis  . DIABETES MELLITUS-TYPE II  . HYPERLIPIDEMIA  . HYPERTENSION  . GERD  . HEMATOCHEZIA  . SHOULDER, ARTHRITIS, DEGEN./OSTEO  . KNEE, ARTHRITIS, DEGEN./OSTEO  . DERANGEMENT MENISCUS  . SHOULDER PAIN  . BACK PAIN  . TENDINITIS, CALCIFIC, SHOULDER, LEFT  . IMPINGEMENT SYNDROME  . RUPTURE ROTATOR CUFF  . OA (osteoarthritis) of knee  . Chronic pain  . CTS (carpal tunnel syndrome)  . Foreign body of thumb, right  . Quadriceps tendon rupture  . Rupture quadriceps tendon  . Difficulty in walking  . Stiffness of joint, not elsewhere classified, lower leg  . Knee pain  . Rotator cuff tear, right  . Partial nontraumatic rupture of rotator cuff  . Shoulder arthritis  . Muscle weakness (generalized)    End of Session Activity Tolerance: Patient tolerated treatment well General Behavior During Session: [facility] for tasks performed Cognition: [facility] for tasks performed   Vangie Bicker, OTR/L  06/17/2012, 10:28 AM

## 2012-06-17 NOTE — Progress Notes (Signed)
Physical Therapy Treatment Patient Details  Name: [name] [name] MRN: [id number] Date of Birth: 08-15-1950  Today's Date: 06/17/2012 Time: [id number] PT Time Calculation (min): 42 min  Visit#: 3  of 12   Re-eval: 07/06/12 Charges: [place] x 8' Manual x 12' Therex x 18'   Authorization: [place] Visit#: 3  of 10    Subjective: Symptoms/Limitations Symptoms: Pt states that [place] and massage decreased his pain last session. Pain Assessment Currently in Pain?: Yes Pain Score:   2 Pain Location: Ankle Pain Orientation: Posterior   Exercise/Treatments Ankle Stretches Plantar Fascia Stretch: 3 reps;30 seconds Soleus Stretch: 3 reps;30 seconds Slant Board Stretch: 3 reps;30 seconds Ankle Exercises - Standing Rocker Board: 2 minutes;Limitations Rocker Board Limitations: A/P Toe Raise: 15 reps  Modalities Modalities: Ultrasound Manual Therapy Manual Therapy: Myofascial release Myofascial Release: To achilles tendon and throughout posterior calcaneus to decrease tightness and adhesions Ultrasound Ultrasound Location: L achilles tendon  Ultrasound Parameters: PULSED 1 w/cm2 @ 3 mhz  Ultrasound Goals: Pain  Physical Therapy Assessment and Plan PT Assessment and Plan Clinical Impression Statement: Began A/P rocker board to improve ankle strength/control. [place] and manual completed again this session secondary to positive results after last session. Adhesions have decreased at calcaneus. Tightness and spasms noted in gastroc and soleus. SCS completed to gastroc to decrease spasms. Pt reports pain decrease to 0/10 at end of session. PT Plan: Continue to decrease tightness and pain per PT POC.     Problem List Patient Active Problem List  Diagnosis  . DIABETES MELLITUS-TYPE II  . HYPERLIPIDEMIA  . HYPERTENSION  . GERD  . HEMATOCHEZIA  . SHOULDER, ARTHRITIS, DEGEN./OSTEO  . KNEE, ARTHRITIS, DEGEN./OSTEO  . DERANGEMENT MENISCUS  . SHOULDER PAIN  . BACK PAIN  .  TENDINITIS, CALCIFIC, SHOULDER, LEFT  . IMPINGEMENT SYNDROME  . RUPTURE ROTATOR CUFF  . OA (osteoarthritis) of knee  . Chronic pain  . CTS (carpal tunnel syndrome)  . Foreign body of thumb, right  . Quadriceps tendon rupture  . Rupture quadriceps tendon  . Difficulty in walking  . Stiffness of joint, not elsewhere classified, lower leg  . Knee pain  . Rotator cuff tear, right  . Partial nontraumatic rupture of rotator cuff  . Shoulder arthritis  . Muscle weakness (generalized)    PT - End of Session Activity Tolerance: Patient tolerated treatment well General Behavior During Session: [facility] for tasks performed Cognition: [facility] for tasks performed  GP Functional Assessment Tool Used: LEFS  Rachelle Hora, PTA 06/17/2012, 10:58 AM

## 2012-06-19 ENCOUNTER — Ambulatory Visit (HOSPITAL_COMMUNITY)
Admission: RE | Admit: 2012-06-19 | Discharge: 2012-06-19 | Disposition: A | Discharge status: Home / Self Care | Payer: Medicare Other | Source: Ambulatory Visit | Attending: Orthopedic Surgery | Admitting: Orthopedic Surgery

## 2012-06-19 DIAGNOSIS — M6281 Muscle weakness (generalized): Secondary | ICD-10-CM

## 2012-06-19 NOTE — Progress Notes (Signed)
Physical Therapy Treatment Patient Details  Name: [name] [name] MRN: [id number] Date of Birth: 1950-12-20  Today's Date: 06/19/2012 Time: 0850-0930 PT Time Calculation (min): 40 min Visit#: 4  of 12   Re-eval: 07/06/12 Authorization: [place] [employer]are  Authorization Visit#: 4  of 10   Charges:  therex 18', manual 8', [place] 8'  Subjective: Symptoms/Limitations Symptoms: Pt. reports pain of 1/10 today, overall doing well.   Exercise/Treatments Ankle Stretches Plantar Fascia Stretch: 3 reps;30 seconds Slant Board Stretch: 3 reps;30 seconds Ankle Exercises - Standing Rocker Board: 2 minutes;Limitations Rocker Board Limitations: A/P Toe Raise: 20 reps     Modalities Modalities: Ultrasound Manual Therapy Manual Therapy: Myofascial release Myofascial Release: To achilles tendon and throughout posterior calcaneus to decrease tightness and adhesions Ultrasound Ultrasound Location: L achilles tendon and posterior calcaneus Ultrasound Parameters: PULSED 1 w/cm2 @ 3 mhz  X 8 minutes Ultrasound Goals: Pain  Physical Therapy Assessment and Plan PT Assessment and Plan Clinical Impression Statement: Pt. able to complete all exercises with good control and form.  Overall improving with decreased painful symptoms and increased mobility/ activity tolerance.  Pt. painfree at end of session.. PT Plan: Continue to decrease tightness and pain per PT POC.     Problem List Patient Active Problem List  Diagnosis  . DIABETES MELLITUS-TYPE II  . HYPERLIPIDEMIA  . HYPERTENSION  . GERD  . HEMATOCHEZIA  . SHOULDER, ARTHRITIS, DEGEN./OSTEO  . KNEE, ARTHRITIS, DEGEN./OSTEO  . DERANGEMENT MENISCUS  . SHOULDER PAIN  . BACK PAIN  . TENDINITIS, CALCIFIC, SHOULDER, LEFT  . IMPINGEMENT SYNDROME  . RUPTURE ROTATOR CUFF  . OA (osteoarthritis) of knee  . Chronic pain  . CTS (carpal tunnel syndrome)  . Foreign body of thumb, right  . Quadriceps tendon rupture  . Rupture quadriceps tendon  .  Difficulty in walking  . Stiffness of joint, not elsewhere classified, lower leg  . Knee pain  . Rotator cuff tear, right  . Partial nontraumatic rupture of rotator cuff  . Shoulder arthritis  . Muscle weakness (generalized)    PT - End of Session Activity Tolerance: Patient tolerated treatment well General Behavior During Session: [facility] for tasks performed Cognition: [facility] for tasks performed   Teena Irani, PTA/CLT 06/19/2012, 9:31 AM

## 2012-06-19 NOTE — Progress Notes (Signed)
Occupational Therapy Treatment Patient Details  Name: PURAV FLENER MRN: [id number] Date of Birth: [birth year]  Today's Date: 06/19/2012 Time: [id number] OT Time Calculation (min): 31 min Manual Therapy [phone] 20' Reassessemnt [phone] 11' Visit#: 10  of 12   Re-eval: 06/19/12    Authorization: Medicare - Dr. Ruthe Mannan order states that he has 10 visits   Authorization Time Period: Medicare - Dr. Ruthe Mannan order states that he has 10 visits   Authorization Visit#: 10  of 10   Subjective S:  I feel like I can use it pretty normal.  The only time it bothers me now, is at night.   Special Tests: UEFI was 52%, improved to 75%. Pain Assessment Currently in Pain?: Yes Pain Score:   1 Pain Location: Shoulder Pain Orientation: Right;Anterior Pain Type: Acute pain  Precautions/Restrictions   progress as tolerated  Exercise/Treatments    Manual Therapy Manual Therapy: Myofascial release Myofascial Release: MFR and manual stretching to right anterior upper arm, shoulder, and scapular region to decresae pain and restrictions and increase pain free mobility.  [id number]  Occupational Therapy Assessment and Plan OT Assessment and Plan Clinical Impression Statement: A:  Please refer to dc summary.  flexion 168, abduction 150, ext rot 70, int rot 90 strength is 5/5.  Reviewed HEP for strengthening. OT Plan: P:  DC from skilled OT intervention this date.     Goals Short Term Goals Time to Complete Short Term Goals: 3 weeks Short Term Goal 1: Patient will be educated on HEP. Short Term Goal 1 Progress: Met Short Term Goal 2: Patient will increase PROM in right shoulder to WNL for increased ability to reach into overhead cabinets. Short Term Goal 2 Progress: Met Short Term Goal 3: Patient will increase right shoulder flexors and abductors to 4+/5 strength for increased ability to lift tools when working around the house. Short Term Goal 3 Progress: Met Short Term Goal 4: Patient will  decrease fascial restrictions in right shoulder to minimal. Short Term Goal 4 Progress: Met Short Term Goal 5: Patient will decrease pain in his right shoulder to 2/10 when reaching out to the side to grab the television remote. Short Term Goal 5 Progress: Met Long Term Goals Time to Complete Long Term Goals: 6 weeks Long Term Goal 1: Patient will return to prior level of I with all B/IADLs and leisure activities, using his right arm as dominant. Long Term Goal 1 Progress: Met Long Term Goal 2: Patient will increase right shoulder AROM to [facility] for increased ability to reach into overhead cabinets. Long Term Goal 2 Progress: Met Long Term Goal 3: Patient will increase right shoulder strength to 5/5 for increased ability to lift bags of groceries onto the countertop. Long Term Goal 3 Progress: Met Long Term Goal 4: Patient will decrease pain in right shoulder to 1/10 when working around the house. Long Term Goal 4 Progress: Progressing toward goal Long Term Goal 5: Patient will decrease fascial restrictions to trace in his right shoulder region. Long Term Goal 5 Progress: Met  Problem List Patient Active Problem List  Diagnosis  . DIABETES MELLITUS-TYPE II  . HYPERLIPIDEMIA  . HYPERTENSION  . GERD  . HEMATOCHEZIA  . SHOULDER, ARTHRITIS, DEGEN./OSTEO  . KNEE, ARTHRITIS, DEGEN./OSTEO  . DERANGEMENT MENISCUS  . SHOULDER PAIN  . BACK PAIN  . TENDINITIS, CALCIFIC, SHOULDER, LEFT  . IMPINGEMENT SYNDROME  . RUPTURE ROTATOR CUFF  . OA (osteoarthritis) of knee  . Chronic pain  . CTS (carpal tunnel syndrome)  .  Foreign body of thumb, right  . Quadriceps tendon rupture  . Rupture quadriceps tendon  . Difficulty in walking  . Stiffness of joint, not elsewhere classified, lower leg  . Knee pain  . Rotator cuff tear, right  . Partial nontraumatic rupture of rotator cuff  . Shoulder arthritis  . Muscle weakness (generalized)    End of Session Activity Tolerance: Patient tolerated  treatment well General Behavior During Session: [facility] for tasks performed OT Plan of Care OT Home Exercise Plan: Functional activities and strengthening.    GO Functional Assessment Tool Used: UEFI scored 75% I level and 25% impairment level Functional Limitation: Carrying, moving and handling objects Carrying, Moving and Handling Objects Goal Status [id number]): At least 1 percent but less than 20 percent impaired, limited or restricted Carrying, Moving and Handling Objects Discharge Status [phone]): At least 20 percent but less than 40 percent impaired, limited or restricted  Vangie Bicker, OTR/L  06/19/2012, 10:28 AM

## 2012-06-24 ENCOUNTER — Ambulatory Visit (HOSPITAL_COMMUNITY): Payer: Self-pay | Admitting: Specialist

## 2012-06-24 ENCOUNTER — Ambulatory Visit (HOSPITAL_COMMUNITY)
Admission: RE | Admit: 2012-06-24 | Discharge: 2012-06-24 | Disposition: A | Discharge status: Home / Self Care | Payer: Medicare Other | Source: Ambulatory Visit | Attending: Orthopedic Surgery | Admitting: Orthopedic Surgery

## 2012-06-24 NOTE — Progress Notes (Addendum)
Physical Therapy Treatment Patient Details  Name: [name] [name] MRN: [id number] Date of Birth: 15-Aug-1950  Today's Date: 06/24/2012 Time: 0932-1007 PT Time Calculation (min): 35 min  Visit#: 5  of 12   Re-eval: 07/06/12 Charges: Therex x 15' Manual x 10' [place] x 8'  Authorization: [place] Visit#: 5  of 10    Subjective: Symptoms/Limitations Symptoms: Pt reports 0/10 pain at beginning of session. Pain Assessment Currently in Pain?: No/denies  Exercise/Treatments Ankle Stretches Plantar Fascia Stretch: 3 reps;30 seconds Slant Board Stretch: 3 reps;60 seconds Ankle Exercises - Standing Rocker Board: 2 minutes;Limitations Rocker Board Limitations: A/P  Physical Therapy Assessment and Plan PT Assessment and Plan Clinical Impression Statement: Pt's pain appears to have decreased. Stretches increased pain to 1-2/10. Pt is still slightly sensitive to touch in lateral calcaneus area. Continues [place] and manual techniques to decrease adhesions and tightness. Pt reports 0/10 pain at end of session. PT Plan: Reassess next session.     Problem List Patient Active Problem List  Diagnosis  . DIABETES MELLITUS-TYPE II  . HYPERLIPIDEMIA  . HYPERTENSION  . GERD  . HEMATOCHEZIA  . SHOULDER, ARTHRITIS, DEGEN./OSTEO  . KNEE, ARTHRITIS, DEGEN./OSTEO  . DERANGEMENT MENISCUS  . SHOULDER PAIN  . BACK PAIN  . TENDINITIS, CALCIFIC, SHOULDER, LEFT  . IMPINGEMENT SYNDROME  . RUPTURE ROTATOR CUFF  . OA (osteoarthritis) of knee  . Chronic pain  . CTS (carpal tunnel syndrome)  . Foreign body of thumb, right  . Quadriceps tendon rupture  . Rupture quadriceps tendon  . Difficulty in walking  . Stiffness of joint, not elsewhere classified, lower leg  . Knee pain  . Rotator cuff tear, right  . Partial nontraumatic rupture of rotator cuff  . Shoulder arthritis  . Muscle weakness (generalized)    PT - End of Session Activity Tolerance: Patient tolerated treatment  well General Behavior During Session: [facility] for tasks performed Cognition: [facility] for tasks performed  Rachelle Hora, PTA 06/24/2012, 10:44 AM

## 2012-06-26 ENCOUNTER — Ambulatory Visit (HOSPITAL_COMMUNITY): Payer: Self-pay | Admitting: Physical Therapy

## 2012-06-26 ENCOUNTER — Ambulatory Visit (HOSPITAL_COMMUNITY)
Admission: RE | Admit: 2012-06-26 | Discharge: 2012-06-26 | Disposition: A | Discharge status: Home / Self Care | Payer: Medicare Other | Source: Ambulatory Visit | Attending: Orthopedic Surgery | Admitting: Orthopedic Surgery

## 2012-06-26 NOTE — Progress Notes (Signed)
Physical Therapy Treatment Patient Details  Name: [name] [name] MRN: [id number] Date of Birth: 18-Apr-1951  Today's Date: 06/26/2012 Time: [id number] PT Time Calculation (min): 40 min  Visit#: 6  of 12   Re-eval: 07/05/12   Authorization: [place] [employer]    Authorization Visit#: 6  of 10    Subjective: Symptoms/Limitations Symptoms: Pt states that he has not used his boot for over a week.  He has 0 pain the last time he had pain was yesterday for a short period of time.   Pain Assessment Currently in Pain?: No/denies  Exercise/Treatments  Ankle Stretches Plantar Fascia Stretch: 3 reps;30 seconds Gastroc Stretch: 3 reps;30 seconds Slant Board Stretch: 3 reps;30 seconds   Ankle Exercises - Standing Rocker Board: 2 minutes Rocker Board Limitations: A/P and R/L   Modalities Modalities: Ultrasound Manual Therapy Manual Therapy: Massage Massage: To gastroc mm w/friction massage to achilles tendon. Ultrasound Ultrasound Location: L achilles tendon and calcaneus Ultrasound Parameters: pulsed 1 w/cm 2 @ 64mhz x 30min. Ultrasound Goals: Pain  Physical Therapy Assessment and Plan PT Assessment and Plan Clinical Impression Statement: Pt strength has improved to wfl at this time; goal is to have no pain for one week period then theapy may be D/C prior to original date;  Pt still has tightness in mm and fascia of gastroc mm. PT Plan: Pt to continue with therapy until pain free for one week.    Goals Home Exercise Program PT Goal: Perform Home Exercise Program - Progress: Met PT Short Term Goals PT Short Term Goal 1 - Progress: Met PT Long Term Goals PT Long Term Goal 1 - Progress: Met PT Long Term Goal 2 - Progress: Met Long Term Goal 3 Progress: Met Long Term Goal 4: New goal 06/26/12- Pt to be pain free without cam boot for one week .  Problem List Patient Active Problem List  Diagnosis  . DIABETES MELLITUS-TYPE II  . HYPERLIPIDEMIA  . HYPERTENSION  . GERD  .  HEMATOCHEZIA  . SHOULDER, ARTHRITIS, DEGEN./OSTEO  . KNEE, ARTHRITIS, DEGEN./OSTEO  . DERANGEMENT MENISCUS  . SHOULDER PAIN  . BACK PAIN  . TENDINITIS, CALCIFIC, SHOULDER, LEFT  . IMPINGEMENT SYNDROME  . RUPTURE ROTATOR CUFF  . OA (osteoarthritis) of knee  . Chronic pain  . CTS (carpal tunnel syndrome)  . Foreign body of thumb, right  . Quadriceps tendon rupture  . Rupture quadriceps tendon  . Difficulty in walking  . Stiffness of joint, not elsewhere classified, lower leg  . Knee pain  . Rotator cuff tear, right  . Partial nontraumatic rupture of rotator cuff  . Shoulder arthritis  . Muscle weakness (generalized)    PT - End of Session Activity Tolerance: Patient tolerated treatment well General Behavior During Session: [facility] for tasks performed Cognition: [facility] for tasks performed  GP    RUSSELL,CINDY 06/26/2012, 11:02 AM

## 2012-07-01 ENCOUNTER — Ambulatory Visit (HOSPITAL_COMMUNITY)
Admission: RE | Admit: 2012-07-01 | Discharge: 2012-07-01 | Disposition: A | Discharge status: Home / Self Care | Payer: Medicare Other | Source: Ambulatory Visit | Attending: Orthopedic Surgery | Admitting: Orthopedic Surgery

## 2012-07-01 NOTE — Progress Notes (Signed)
Physical Therapy Treatment Patient Details  Name: DONTAVION EVEN MRN: [id number] Date of Birth: October 25, 1950  Today's Date: 07/01/2012 Time: [id number] PT Time Calculation (min): 40 min  Visit#: 7  of 12   Re-eval: 07/05/12 Charges: TE x 12' Manual x 18' [place] x 8'  Authorization: [employer]  Authorization Visit#: 7  of 10    Subjective: Symptoms/Limitations Symptoms: Pt reports minimal soreness today. Pain Assessment Currently in Pain?: Yes Pain Score:  (.5/10) Pain Location: Heel Pain Orientation: Left   Exercise/Treatments  Ankle Stretches Plantar Fascia Stretch: 2 reps;60 seconds Soleus Stretch: 2 reps;60 seconds Slant Board Stretch: 2 reps;60 seconds    Modalities Modalities: Ultrasound Manual Therapy Manual Therapy: Massage Myofascial Release: MFR to L gastroc, soleus and achilles Ultrasound Ultrasound Location: L achilles tendon and lateral calcaneus  Ultrasound Parameters: pulsed 1 w/cm2 @ 48mhz x 68min Ultrasound Goals: Pain  Physical Therapy Assessment and Plan PT Assessment and Plan Clinical Impression Statement: Stretching, [place] and manual techniques continued as these techniques appear to be decreasing pain. Pt continues to present with adhesions at lateral calcaneus. Pt reports 0/10 pain at end of session. PT Plan: Pt to continue with therapy until pain free for one week, per PT.     Problem List Patient Active Problem List  Diagnosis  . DIABETES MELLITUS-TYPE II  . HYPERLIPIDEMIA  . HYPERTENSION  . GERD  . HEMATOCHEZIA  . SHOULDER, ARTHRITIS, DEGEN./OSTEO  . KNEE, ARTHRITIS, DEGEN./OSTEO  . DERANGEMENT MENISCUS  . SHOULDER PAIN  . BACK PAIN  . TENDINITIS, CALCIFIC, SHOULDER, LEFT  . IMPINGEMENT SYNDROME  . RUPTURE ROTATOR CUFF  . OA (osteoarthritis) of knee  . Chronic pain  . CTS (carpal tunnel syndrome)  . Foreign body of thumb, right  . Quadriceps tendon rupture  . Rupture quadriceps tendon  . Difficulty in walking  . Stiffness of  joint, not elsewhere classified, lower leg  . Knee pain  . Rotator cuff tear, right  . Partial nontraumatic rupture of rotator cuff  . Shoulder arthritis  . Muscle weakness (generalized)    PT - End of Session Activity Tolerance: Patient tolerated treatment well General Behavior During Session: [facility] for tasks performed Cognition: [facility] for tasks performed  Rachelle Hora, PTA 07/01/2012, 12:20 PM

## 2012-07-04 ENCOUNTER — Ambulatory Visit (HOSPITAL_COMMUNITY)
Admission: RE | Admit: 2012-07-04 | Discharge: 2012-07-04 | Disposition: A | Discharge status: Home / Self Care | Payer: Medicare Other | Source: Ambulatory Visit | Attending: Orthopedic Surgery | Admitting: Orthopedic Surgery

## 2012-07-04 NOTE — Progress Notes (Signed)
Physical Therapy Treatment Patient Details  Name: [name] [name] MRN: [id number] Date of Birth: 07-18-1950  Today's Date: 07/04/2012 Time: [id number] PT Time Calculation (min): 40 min  Visit#: 8  of 12   Re-eval: 07/05/12    Authorization: [employer]   Authorization Time Period:    Authorization Visit#: 8  of 10    Subjective: Symptoms/Limitations Symptoms: Pt states pain is low about a .5 Pain Assessment Currently in Pain?: Yes Pain Score:  (.5) Pain Location: Ankle Pain Orientation: Left Pain Type: Chronic pain  Exercise/Treatments      Ankle Stretches Plantar Fascia Stretch: 2 reps;60 seconds Soleus Stretch: 2 reps;60 seconds Slant Board Stretch: 2 reps;60 seconds   Ankle Exercises - Standing SLS: vector stance x 10" x 3 Rocker Board: 2 minutes Rocker Board Limitations: A/P and R/L   Modalities Modalities: Ultrasound Manual Therapy Manual Therapy: Massage Ultrasound Ultrasound Location: L achillies tendon Ultrasound Parameters: 1.5 w/cm2 x 8 min using 79mhz Ultrasound Goals: Pain  Physical Therapy Assessment and Plan PT Assessment and Plan Clinical Impression Statement: Pt progressing well PT Plan: continue therapy until painfree x one week    Goals Home Exercise Program PT Goal: Perform Home Exercise Program - Progress: Met PT Short Term Goals PT Short Term Goal 1 - Progress: Met PT Long Term Goals PT Long Term Goal 1 - Progress: Met PT Long Term Goal 2 - Progress: Met Long Term Goal 3 Progress: Met Long Term Goal 4 Progress: Progressing toward goal  Problem List Patient Active Problem List  Diagnosis  . DIABETES MELLITUS-TYPE II  . HYPERLIPIDEMIA  . HYPERTENSION  . GERD  . HEMATOCHEZIA  . SHOULDER, ARTHRITIS, DEGEN./OSTEO  . KNEE, ARTHRITIS, DEGEN./OSTEO  . DERANGEMENT MENISCUS  . SHOULDER PAIN  . BACK PAIN  . TENDINITIS, CALCIFIC, SHOULDER, LEFT  . IMPINGEMENT SYNDROME  . RUPTURE ROTATOR CUFF  . OA (osteoarthritis) of knee  .  Chronic pain  . CTS (carpal tunnel syndrome)  . Foreign body of thumb, right  . Quadriceps tendon rupture  . Rupture quadriceps tendon  . Difficulty in walking  . Stiffness of joint, not elsewhere classified, lower leg  . Knee pain  . Rotator cuff tear, right  . Partial nontraumatic rupture of rotator cuff  . Shoulder arthritis  . Muscle weakness (generalized)    General Behavior During Session: [facility] for tasks performed Cognition: [facility] for tasks performed  GP    RUSSELL,CINDY 07/04/2012, 12:40 PM

## 2012-07-08 ENCOUNTER — Ambulatory Visit (HOSPITAL_COMMUNITY)
Admission: RE | Admit: 2012-07-08 | Discharge: 2012-07-08 | Disposition: A | Discharge status: Home / Self Care | Payer: Medicare Other | Source: Ambulatory Visit | Attending: Orthopedic Surgery | Admitting: Orthopedic Surgery

## 2012-07-08 NOTE — Progress Notes (Addendum)
Physical Therapy Reassessment Patient Details  Name: [name] [name] MRN: [id number] Date of Birth: May 28, 1951  Today's Date: 07/08/2012 Time: 1105-1145 PT Time Calculation (min): 40 min  Visit#: 9  of 12   Re-eval: 08/02/12 Charges: Therex x 15' MMT x 1 ROMM x 1   Authorization: [employer]   Authorization Visit#: 9  of 10    Past Medical History:  Past Medical History  Diagnosis Date  . COPD (chronic obstructive pulmonary disease)   . Sinusitis   . PUD (peptic ulcer disease)     remote, reports f/u EGD about 8 years ago unremarkable   . Diabetes   . GERD (gastroesophageal reflux disease)   . Hyperlipidemia   . Hypertension   . Sleep apnea   . Post traumatic stress disorder   . Anxiety   . Depression   . Bell palsy    Past Surgical History:  Past Surgical History  Procedure Date  . Asad lt shoulder 12/2008    left shoulder  . Umbilical hernia repair [id number]    [place]  . Knee arthroscopy 10/2007    left  . Toenail excision     removed x2-bilateral  . Eye surgery 12/22/2010    tear duct probing-  . Foreign body removal 03/29/2011    Procedure: REMOVAL FOREIGN BODY EXTREMITY;  Surgeon: Arther Abbott, MD;  Location: [facility];  Service: Orthopedics;  Laterality: Right;  Removal Foreign Body Right Thumb  . Quadriceps tendon repair 07/21/2011    Procedure: REPAIR QUADRICEP TENDON;  Surgeon: Arther Abbott, MD;  Location: [facility];  Service: Orthopedics;  Laterality: Right;    Subjective Symptoms/Limitations Symptoms: Pt states pain is a .2 today. He says his pain is barely there. Pain Assessment Currently in Pain?: Yes Pain Score:  (.2) Pain Location: Ankle Pain Orientation: Left   Assessment LLE AROM (degrees) Left Ankle Dorsiflexion: 15  Left Ankle Plantar Flexion: 56  Left Ankle Inversion: 32  Left Ankle Eversion: 20  LLE Strength Left Ankle Dorsiflexion: 5/5 Left Ankle Plantar Flexion: 5/5 Left Ankle Inversion: 5/5 Left Ankle Eversion:  5/5  Exercise/Treatments Ankle Stretches Plantar Fascia Stretch: 2 reps;60 seconds Soleus Stretch: 2 reps;60 seconds Slant Board Stretch: 2 reps;60 seconds Ankle Exercises - Standing SLS: vector stance x 10" x 3  Physical Therapy Assessment and Plan PT Assessment and Plan Clinical Impression Statement: Pt's pain continues to decrease. [place] held this session as pain is very low. Pt presents within proved ROM and strength. Pt would benefit from continuing skilled therapy to work on decreasing pain. PT wishes to continue with pt until he is pain free for 1 week. PT Plan: Coninue until pain free for 1 week, per PT.    Goals Home Exercise Program Pt will Perform Home Exercise Program: Independently PT Short Term Goals Time to Complete Short Term Goals: 3 weeks PT Short Term Goal 1: Pain to be a 0 in the Cam boot. PT Short Term Goal 1 - Progress: Met PT Long Term Goals Time to Complete Long Term Goals:  (6 weeks) PT Long Term Goal 1: Pt to be weaning out of CAM PT Long Term Goal 1 - Progress: Met PT Long Term Goal 2: Strength to be 5/5 to allow walking without cam boot for an hour without pain PT Long Term Goal 2 - Progress: Met Long Term Goal 3: Pt to obtain a foot orthotic. Long Term Goal 3 Progress: Met Long Term Goal 4: New goal 06/26/12- Pt to be pain free without  cam boot for one week .  Problem List Patient Active Problem List  Diagnosis  . DIABETES MELLITUS-TYPE II  . HYPERLIPIDEMIA  . HYPERTENSION  . GERD  . HEMATOCHEZIA  . SHOULDER, ARTHRITIS, DEGEN./OSTEO  . KNEE, ARTHRITIS, DEGEN./OSTEO  . DERANGEMENT MENISCUS  . SHOULDER PAIN  . BACK PAIN  . TENDINITIS, CALCIFIC, SHOULDER, LEFT  . IMPINGEMENT SYNDROME  . RUPTURE ROTATOR CUFF  . OA (osteoarthritis) of knee  . Chronic pain  . CTS (carpal tunnel syndrome)  . Foreign body of thumb, right  . Quadriceps tendon rupture  . Rupture quadriceps tendon  . Difficulty in walking  . Stiffness of joint, not elsewhere  classified, lower leg  . Knee pain  . Rotator cuff tear, right  . Partial nontraumatic rupture of rotator cuff  . Shoulder arthritis  . Muscle weakness (generalized)    PT - End of Session Activity Tolerance: Patient tolerated treatment well General Behavior During Session: [facility] for tasks performed Cognition: [facility] for tasks performed  Rachelle Hora, PTA 07/08/2012, 1:54 PM  Physician Documentation Your signature is required to indicate approval of the treatment plan as stated above.  Please sign and either send electronically or make a copy of this report for your files and return this physician signed original.   Please mark one 1.__approve of plan  2. ___approve of plan with the following conditions.   ______________________________                                                          _____________________ Physician Signature                                                                                                             Date

## 2012-07-11 ENCOUNTER — Ambulatory Visit (HOSPITAL_COMMUNITY)
Admission: RE | Admit: 2012-07-11 | Discharge: 2012-07-11 | Disposition: A | Discharge status: Home / Self Care | Payer: Medicare Other | Source: Ambulatory Visit | Attending: Orthopedic Surgery | Admitting: Orthopedic Surgery

## 2012-07-11 NOTE — Progress Notes (Signed)
Physical Therapy Treatment Patient Details  Name: [name] [name] MRN: [id number] Date of Birth: [birth year]  Today's Date: 07/11/2012 Time: [id number] PT Time Calculation (min): 30 min  Visit#: 10  of 12   Re-eval: 08/02/12 Authorization: [employer]   Authorization Visit#: 10  of 10   Charges:  therex 25'  Subjective: Symptoms/Limitations Symptoms: Pt. states his pain remains low, under a 1/10 today.   Exercise/Treatments Ankle Stretches Plantar Fascia Stretch: 2 reps;60 seconds Soleus Stretch: 2 reps;60 seconds Slant Board Stretch: 2 reps;60 seconds Ankle Exercises - Standing SLS: vector stance x 10" x 5 Rocker Board: 2 minutes Rocker Board Limitations: R/L    Manual Therapy Manual Therapy: Myofascial release Myofascial Release: not done today; pt. declined  Physical Therapy Assessment and Plan PT Assessment and Plan Clinical Impression Statement: Pt declined modalites and manual treatment today as pain remains low.  Completed LEFS with overall 17 point increase showing less difficulty in completing daily activities.  Pt. is close to meeting goal; to continue remaining visits and will be ready for discharge. PT Plan: Coninue until pain free for 1 week, per PT.  Add squatting and stair training as those are difficult for pt. According to LEFS.   Problem List Patient Active Problem List  Diagnosis  . DIABETES MELLITUS-TYPE II  . HYPERLIPIDEMIA  . HYPERTENSION  . GERD  . HEMATOCHEZIA  . SHOULDER, ARTHRITIS, DEGEN./OSTEO  . KNEE, ARTHRITIS, DEGEN./OSTEO  . DERANGEMENT MENISCUS  . SHOULDER PAIN  . BACK PAIN  . TENDINITIS, CALCIFIC, SHOULDER, LEFT  . IMPINGEMENT SYNDROME  . RUPTURE ROTATOR CUFF  . OA (osteoarthritis) of knee  . Chronic pain  . CTS (carpal tunnel syndrome)  . Foreign body of thumb, right  . Quadriceps tendon rupture  . Rupture quadriceps tendon  . Difficulty in walking  . Stiffness of joint, not elsewhere classified, lower leg  . Knee  pain  . Rotator cuff tear, right  . Partial nontraumatic rupture of rotator cuff  . Shoulder arthritis  . Muscle weakness (generalized)    PT - End of Session Activity Tolerance: Patient tolerated treatment well General Behavior During Session: [facility] for tasks performed Cognition: [facility] for tasks performed  GP Functional Assessment Tool Used: LEFS Functional Limitation: Mobility: Walking and moving around Mobility: Walking and Moving Around Current Status [id number]): At least 20 percent but less than 40 percent impaired, limited or restricted Mobility: Walking and Moving Around Goal Status [id number]): At least 1 percent but less than 20 percent impaired, limited or restricted  Teena Irani, PTA/CLT 07/11/2012, 9:35 AM

## 2012-07-15 ENCOUNTER — Ambulatory Visit (HOSPITAL_COMMUNITY)
Admission: RE | Admit: 2012-07-15 | Discharge: 2012-07-15 | Disposition: A | Discharge status: Home / Self Care | Payer: Medicare Other | Source: Ambulatory Visit | Attending: Orthopedic Surgery | Admitting: Orthopedic Surgery

## 2012-07-15 NOTE — Progress Notes (Signed)
Physical Therapy Treatment Patient Details  Name: [name] [name] MRN: [id number] Date of Birth: 02-Jul-1951  Today's Date: 07/15/2012 Time: [id number] PT Time Calculation (min): 35 min  Visit#: 11  of 12   Re-eval: 08/02/12 Charges: Therex x 25' Manual x 8'  Authorization: [employer]   Authorization Time Period:    Authorization Visit#: 11  of 10    Subjective: Symptoms/Limitations Symptoms: Pt states that he last had pain Saurday. (Today is Monday) Pain Assessment Currently in Pain?: No/denies   Exercise/Treatments  Ankle Stretches Plantar Fascia Stretch: 3 reps;60 seconds Soleus Stretch: 3 reps;60 seconds Slant Board Stretch: 3 reps;60 seconds Ankle Exercises - Standing SLS: vector stance x 10" x 5 Rocker Board: 2 minutes Rocker Board Limitations: R/L A/P  Manual Therapy Manual Therapy: Myofascial release Myofascial Release: To L posterior lateral heel  Physical Therapy Assessment and Plan PT Assessment and Plan Clinical Impression Statement: Pt completes stretches with minimal need for cueing. Pt displays mm fatigue with rocker board. Multiple adhesions noted in L lateral heel. Adhesions resolved 100% with MFR. Pt reports 0/10 pain at end of session. PT Plan: Coninue until pain free for 1 week, per PT.     Problem List Patient Active Problem List  Diagnosis  . DIABETES MELLITUS-TYPE II  . HYPERLIPIDEMIA  . HYPERTENSION  . GERD  . HEMATOCHEZIA  . SHOULDER, ARTHRITIS, DEGEN./OSTEO  . KNEE, ARTHRITIS, DEGEN./OSTEO  . DERANGEMENT MENISCUS  . SHOULDER PAIN  . BACK PAIN  . TENDINITIS, CALCIFIC, SHOULDER, LEFT  . IMPINGEMENT SYNDROME  . RUPTURE ROTATOR CUFF  . OA (osteoarthritis) of knee  . Chronic pain  . CTS (carpal tunnel syndrome)  . Foreign body of thumb, right  . Quadriceps tendon rupture  . Rupture quadriceps tendon  . Difficulty in walking  . Stiffness of joint, not elsewhere classified, lower leg  . Knee pain  . Rotator cuff tear, right    . Partial nontraumatic rupture of rotator cuff  . Shoulder arthritis  . Muscle weakness (generalized)    PT - End of Session Activity Tolerance: Patient tolerated treatment well General Behavior During Session: [facility] for tasks performed Cognition: [facility] for tasks performed  GP Functional Assessment Tool Used: LEFS  Rachelle Hora, PTA 07/15/2012, 9:33 AM

## 2012-07-18 ENCOUNTER — Ambulatory Visit (HOSPITAL_COMMUNITY)
Admission: RE | Admit: 2012-07-18 | Discharge: 2012-07-18 | Disposition: A | Discharge status: Home / Self Care | Payer: Medicare Other | Source: Ambulatory Visit | Attending: Orthopedic Surgery | Admitting: Orthopedic Surgery

## 2012-07-18 DIAGNOSIS — Z5189 Encounter for other specified aftercare: Secondary | ICD-10-CM | POA: Insufficient documentation

## 2012-07-18 DIAGNOSIS — X58XXXA Exposure to other specified factors, initial encounter: Secondary | ICD-10-CM | POA: Insufficient documentation

## 2012-07-18 DIAGNOSIS — S43429A Sprain of unspecified rotator cuff capsule, initial encounter: Secondary | ICD-10-CM | POA: Insufficient documentation

## 2012-07-18 NOTE — Evaluation (Signed)
Physical Therapy reassessment  Patient Details  Name: [name] [name] MRN: [id number] Date of Birth: 17-Sep-1950  Today's Date: 07/18/2012 Time: 0852-0939 PT Time Calculation (min): 47 min  Visit#: 12  of 12   Re-eval: 09/02/12 Assessment Diagnosis: L tendonitis Next MD Visit: 07/07/2012 Prior Therapy: none  Authorization: [employer]   Authorization Visit#: 12  of 12    Past Medical History:  Past Medical History  Diagnosis Date  . COPD (chronic obstructive pulmonary disease)   . Sinusitis   . PUD (peptic ulcer disease)     remote, reports f/u EGD about 8 years ago unremarkable   . Diabetes   . GERD (gastroesophageal reflux disease)   . Hyperlipidemia   . Hypertension   . Sleep apnea   . Post traumatic stress disorder   . Anxiety   . Depression   . Bell palsy    Past Surgical History:  Past Surgical History  Procedure Date  . Asad lt shoulder 12/2008    left shoulder  . Umbilical hernia repair [id number]    [place]  . Knee arthroscopy 10/2007    left  . Toenail excision     removed x2-bilateral  . Eye surgery 12/22/2010    tear duct probing-[place]  . Foreign body removal 03/29/2011    Procedure: REMOVAL FOREIGN BODY EXTREMITY;  Surgeon: Arther Abbott, MD;  Location: [facility];  Service: Orthopedics;  Laterality: Right;  Removal Foreign Body Right Thumb  . Quadriceps tendon repair 07/21/2011    Procedure: REPAIR QUADRICEP TENDON;  Surgeon: Arther Abbott, MD;  Location: [facility];  Service: Orthopedics;  Laterality: Right;    Subjective Symptoms/Limitations Symptoms: Pt states no pain.  He has been without the boot since the first day of surgery; Doing stretches at home. How long can you sit comfortably?: no problem How long can you stand comfortably?: able to stand without the boot as long as he wants. How long can you walk comfortably?: Pt has had no problem walking without the boot as long he wants. Pain Assessment Pain Score: 0-No pain  Assessment LLE AROM  (degrees) Left Ankle Dorsiflexion: 15  Left Ankle Plantar Flexion: 56  Left Ankle Inversion: 32  Left Ankle Eversion: 12  LLE Strength Left Ankle Dorsiflexion: 5/5 Left Ankle Plantar Flexion: 5/5 Left Ankle Inversion: 5/5 Left Ankle Eversion: 5/5 Palpation Palpation: tight gastroc mm  Exercise/Treatments    Ankle Stretches Plantar Fascia Stretch: 3 reps;30 seconds Gastroc Stretch: 3 reps;30 seconds Slant Board Stretch: 3 reps;60 seconds   Ankle Exercises - Standing BAPS: Standing;Level 3;5 reps SLS: vector stance 10" x 3 Rocker Board: 2 minutes Rocker Board Limitations: A/P only Heel Walk (Round Trip): 2 RT   Modalities Modalities: Ultrasound Manual Therapy Manual Therapy: Massage Massage: To gastroc mm w/friction massage to achilles tendon. Ultrasound Ultrasound Location: L achilles tendon Ultrasound Parameters: 1.5w/cm2 x 8 min using 3 mgHz Ultrasound Goals: Pain  Physical Therapy Assessment and Plan PT Assessment and Plan Clinical Impression Statement: Pt has met all goals; states he has no pain.  Ready for discharge. PT Plan: discharge patient    Goals Home Exercise Program PT Goal: Perform Home Exercise Program - Progress: Met PT Short Term Goals PT Short Term Goal 1 - Progress: Met PT Long Term Goals PT Long Term Goal 1 - Progress: Met PT Long Term Goal 2 - Progress: Met Long Term Goal 3 Progress: Met Long Term Goal 4 Progress: Met  Problem List Patient Active Problem List  Diagnosis  . DIABETES  MELLITUS-TYPE II  . HYPERLIPIDEMIA  . HYPERTENSION  . GERD  . HEMATOCHEZIA  . SHOULDER, ARTHRITIS, DEGEN./OSTEO  . KNEE, ARTHRITIS, DEGEN./OSTEO  . DERANGEMENT MENISCUS  . SHOULDER PAIN  . BACK PAIN  . TENDINITIS, CALCIFIC, SHOULDER, LEFT  . IMPINGEMENT SYNDROME  . RUPTURE ROTATOR CUFF  . OA (osteoarthritis) of knee  . Chronic pain  . CTS (carpal tunnel syndrome)  . Foreign body of thumb, right  . Quadriceps tendon rupture  . Rupture quadriceps  tendon  . Difficulty in walking  . Stiffness of joint, not elsewhere classified, lower leg  . Knee pain  . Rotator cuff tear, right  . Partial nontraumatic rupture of rotator cuff  . Shoulder arthritis  . Muscle weakness (generalized)    PT - End of Session Activity Tolerance: Patient tolerated treatment well General Behavior During Session: [facility] for tasks performed Cognition: [facility] for tasks performed  GP Functional Assessment Tool Used: LEFS Functional Limitation: Mobility: Walking and moving around Mobility: Walking and Moving Around Goal Status [phone]): At least 1 percent but less than 20 percent impaired, limited or restricted Mobility: Walking and Moving Around Discharge Status [phone]): At least 1 percent but less than 20 percent impaired, limited or restricted  Daaiel Starlin,CINDY 07/18/2012, 2:33 PM  Physician Documentation Your signature is required to indicate approval of the treatment plan as stated above.  Please sign and either send electronically or make a copy of this report for your files and return this physician signed original.   Please mark one 1.__approve of plan  2. ___approve of plan with the following conditions.   ______________________________                                                          _____________________ Physician Signature                                                                                                             Date

## 2012-07-25 ENCOUNTER — Ambulatory Visit (INDEPENDENT_AMBULATORY_CARE_PROVIDER_SITE_OTHER): Payer: Medicare Other | Admitting: Otolaryngology

## 2012-07-25 DIAGNOSIS — H9209 Otalgia, unspecified ear: Secondary | ICD-10-CM

## 2012-07-25 DIAGNOSIS — H903 Sensorineural hearing loss, bilateral: Secondary | ICD-10-CM

## 2012-08-15 ENCOUNTER — Ambulatory Visit (INDEPENDENT_AMBULATORY_CARE_PROVIDER_SITE_OTHER): Payer: Medicare Other | Admitting: Orthopedic Surgery

## 2012-08-15 ENCOUNTER — Encounter: Payer: Self-pay | Admitting: Orthopedic Surgery

## 2012-08-15 VITALS — BP 138/78 | Ht 64.0 in | Wt 233.0 lb

## 2012-08-15 DIAGNOSIS — M25569 Pain in unspecified knee: Secondary | ICD-10-CM

## 2012-08-15 DIAGNOSIS — M75110 Incomplete rotator cuff tear or rupture of unspecified shoulder, not specified as traumatic: Secondary | ICD-10-CM

## 2012-08-15 DIAGNOSIS — M179 Osteoarthritis of knee, unspecified: Secondary | ICD-10-CM

## 2012-08-15 DIAGNOSIS — IMO0002 Reserved for concepts with insufficient information to code with codable children: Secondary | ICD-10-CM

## 2012-08-15 DIAGNOSIS — S43429A Sprain of unspecified rotator cuff capsule, initial encounter: Secondary | ICD-10-CM

## 2012-08-15 DIAGNOSIS — S838X9A Sprain of other specified parts of unspecified knee, initial encounter: Secondary | ICD-10-CM

## 2012-08-15 DIAGNOSIS — M1710 Unilateral primary osteoarthritis, unspecified knee: Secondary | ICD-10-CM

## 2012-08-15 DIAGNOSIS — S76119A Strain of unspecified quadriceps muscle, fascia and tendon, initial encounter: Secondary | ICD-10-CM

## 2012-08-15 DIAGNOSIS — M25519 Pain in unspecified shoulder: Secondary | ICD-10-CM

## 2012-08-15 NOTE — Patient Instructions (Addendum)
activities as tolerated 

## 2012-08-15 NOTE — Progress Notes (Signed)
Patient ID: [name] [name], male   DOB: 25-Feb-1951, 62 y.o.   MRN: [id number] Chief Complaint  Patient presents with  . Follow-up    3 moth recheck of right shoulder after PT    BP 138/78  Ht 5\' 4"  (1.626 m)  Wt 233 lb (105.688 kg)  BMI 39.99 kg/m2  1. SHOULDER PAIN   2. Partial nontraumatic rupture of rotator cuff   3. Knee pain   4. Rupture quadriceps tendon   5. OA (osteoarthritis) of knee     MRI IMPRESSION:  1. Supraspinatus tendinosis with partial articular surface  insertional tearing. No full-thickness rotator cuff tear or  muscular atrophy identified.  2. Mild subscapularis and bicipital tendinosis.  3. Superior labral degeneration without discrete tear.  4. Small to moderate joint effusion with mild glenohumeral and  acromioclavicular degenerative changes.   The patient is doing well at this time. He has no major complaints. Review of systems he denies any numbness or tingling in his upper or lower extremities he does have some mild arthritic type aching in his joints  Exam BP 138/78  Ht 5\' 4"  (1.626 m)  Wt 233 lb (105.688 kg)  BMI 39.99 kg/m2 His appearance is normal he looks good today. He is awake alert and oriented x3 his mood and affect are normal he is ambulating with no assistive devices  Right shoulder full range of motion negative impingement no instability normal strength good pulse normal sensation  Left knee no tenderness no swelling stable flexion extension medial and lateral  Right knee full extension good power knee is stable  Right now he stable he is maintained on Norco 10 mg he's doing well with that he'll continue that and call us if he needs any refills otherwise followup as needed

## 2012-12-04 ENCOUNTER — Other Ambulatory Visit: Payer: Self-pay | Admitting: Orthopedic Surgery

## 2012-12-04 MED ORDER — HYDROCODONE-ACETAMINOPHEN 7.5-325 MG PO TABS: 1.0000 | ORAL_TABLET | ORAL | Status: DC | PRN

## 2013-02-27 ENCOUNTER — Other Ambulatory Visit: Payer: Self-pay | Admitting: *Deleted

## 2013-02-27 DIAGNOSIS — I10 Essential (primary) hypertension: Secondary | ICD-10-CM

## 2013-02-27 DIAGNOSIS — I517 Cardiomegaly: Secondary | ICD-10-CM

## 2013-03-04 ENCOUNTER — Ambulatory Visit (HOSPITAL_COMMUNITY)
Admission: RE | Admit: 2013-03-04 | Discharge: 2013-03-04 | Disposition: A | Discharge status: Home / Self Care | Payer: Medicare Other | Source: Ambulatory Visit | Attending: Cardiovascular Disease | Admitting: Cardiovascular Disease

## 2013-03-04 DIAGNOSIS — E669 Obesity, unspecified: Secondary | ICD-10-CM | POA: Insufficient documentation

## 2013-03-04 DIAGNOSIS — E119 Type 2 diabetes mellitus without complications: Secondary | ICD-10-CM | POA: Insufficient documentation

## 2013-03-04 DIAGNOSIS — I429 Cardiomyopathy, unspecified: Secondary | ICD-10-CM | POA: Insufficient documentation

## 2013-03-04 DIAGNOSIS — Z8249 Family history of ischemic heart disease and other diseases of the circulatory system: Secondary | ICD-10-CM | POA: Insufficient documentation

## 2013-03-04 DIAGNOSIS — K219 Gastro-esophageal reflux disease without esophagitis: Secondary | ICD-10-CM | POA: Insufficient documentation

## 2013-03-04 DIAGNOSIS — I10 Essential (primary) hypertension: Secondary | ICD-10-CM

## 2013-03-04 DIAGNOSIS — E785 Hyperlipidemia, unspecified: Secondary | ICD-10-CM | POA: Insufficient documentation

## 2013-03-04 DIAGNOSIS — I517 Cardiomegaly: Secondary | ICD-10-CM

## 2013-03-04 NOTE — Progress Notes (Signed)
[name] [name]   2D echo completed 03/04/2013.   Jamison Neighbor, RDCS

## 2013-03-06 ENCOUNTER — Ambulatory Visit (HOSPITAL_COMMUNITY)
Admission: RE | Admit: 2013-03-06 | Discharge: 2013-03-06 | Disposition: A | Discharge status: Home / Self Care | Payer: Medicare Other | Source: Ambulatory Visit | Attending: Internal Medicine | Admitting: Internal Medicine

## 2013-03-06 DIAGNOSIS — R0989 Other specified symptoms and signs involving the circulatory and respiratory systems: Secondary | ICD-10-CM | POA: Insufficient documentation

## 2013-03-06 DIAGNOSIS — J4489 Other specified chronic obstructive pulmonary disease: Secondary | ICD-10-CM | POA: Insufficient documentation

## 2013-03-06 DIAGNOSIS — I517 Cardiomegaly: Secondary | ICD-10-CM

## 2013-03-06 DIAGNOSIS — Z794 Long term (current) use of insulin: Secondary | ICD-10-CM | POA: Insufficient documentation

## 2013-03-06 DIAGNOSIS — Z87891 Personal history of nicotine dependence: Secondary | ICD-10-CM | POA: Insufficient documentation

## 2013-03-06 DIAGNOSIS — J449 Chronic obstructive pulmonary disease, unspecified: Secondary | ICD-10-CM | POA: Insufficient documentation

## 2013-03-06 DIAGNOSIS — E119 Type 2 diabetes mellitus without complications: Secondary | ICD-10-CM

## 2013-03-06 DIAGNOSIS — Z8249 Family history of ischemic heart disease and other diseases of the circulatory system: Secondary | ICD-10-CM | POA: Insufficient documentation

## 2013-03-06 DIAGNOSIS — R9431 Abnormal electrocardiogram [ECG] [EKG]: Secondary | ICD-10-CM | POA: Insufficient documentation

## 2013-03-06 DIAGNOSIS — I10 Essential (primary) hypertension: Secondary | ICD-10-CM | POA: Insufficient documentation

## 2013-03-06 DIAGNOSIS — R0609 Other forms of dyspnea: Secondary | ICD-10-CM | POA: Insufficient documentation

## 2013-03-06 DIAGNOSIS — E669 Obesity, unspecified: Secondary | ICD-10-CM | POA: Insufficient documentation

## 2013-03-06 DIAGNOSIS — R5381 Other malaise: Secondary | ICD-10-CM | POA: Insufficient documentation

## 2013-03-06 MED ORDER — REGADENOSON 0.4 MG/5ML IV SOLN
0.4000 mg | Freq: Once | INTRAVENOUS | Status: AC
  Administered 2013-03-06: 0.4 mg via INTRAVENOUS

## 2013-03-06 MED ORDER — TECHNETIUM TC 99M SESTAMIBI GENERIC - CARDIOLITE
30.0000 | Freq: Once | INTRAVENOUS | Status: AC | PRN
  Administered 2013-03-06: 30 via INTRAVENOUS

## 2013-03-06 MED ORDER — TECHNETIUM TC 99M SESTAMIBI GENERIC - CARDIOLITE
10.0000 | Freq: Once | INTRAVENOUS | Status: AC | PRN
  Administered 2013-03-06: 10 via INTRAVENOUS

## 2013-03-06 NOTE — Procedures (Addendum)
[facility] [address] [place] [address] [id number]  Cardiology Nuclear Med Study  [name] [name] is a 62 y.o. male     MRN : [id number]     DOB: [birth year]  Procedure Date: 03/06/2013  Nuclear Med Background Indication for Stress Test:  Evaluation for Ischemia and Abnormal EKG History:  Asthma and COPD Cardiac Risk Factors: Family History - CAD, History of Smoking, Hypertension, IDDM Type 2, Lipids and Obesity  Symptoms:  DOE, Fatigue and SOB   Nuclear Pre-Procedure Caffeine/Decaff Intake:  7:00pm NPO After: 5:00am   IV Site: R Hand  IV 0.9% NS with Angio Cath:  22g  Chest Size (in):  42"  IV Started by: Azucena Cecil, RN  Height: 5\' 4"  (1.626 m)  Cup Size: n/a  BMI:  Body mass index is 39.8 kg/(m^2). Weight:  232 lb (105.235 kg)   Tech Comments:  N/A    Nuclear Med Study 1 or 2 day study: 1 day  Stress Test Type:  [place] Provider:  Terance Ice, MD   Resting Radionuclide: Technetium 64m Sestamibi  Resting Radionuclide Dose: 10.4 mCi   Stress Radionuclide:  Technetium 29m Sestamibi  Stress Radionuclide Dose: 29.3 mCi           Stress Protocol Rest HR: 61 Stress HR: 81  Rest BP: 155/92 Stress BP: 158/94  Exercise Time (min): n/a METS: n/a   Predicted Max HR: 157 bpm % Max HR: 51.59 bpm Rate Pressure Product: 14094  Dose of Adenosine (mg):  n/a Dose of Lexiscan: 0.4 mg  Dose of Atropine (mg): n/a Dose of Dobutamine: n/a mcg/kg/min (at max HR)  Stress Test Technologist: Leane Para, CCT Nuclear Technologist: Otho Perl, CNMT   Rest Procedure:  Myocardial perfusion imaging was performed at rest 45 minutes following the intravenous administration of Technetium 45m Sestamibi. Stress Procedure:  The patient received IV Lexiscan 0.4 mg over 15-seconds.  Technetium 72m Sestamibi injected at 30-seconds.  There were no significant changes with Lexiscan.  Quantitative spect  images were obtained after a 45 minute delay.  Transient Ischemic Dilatation (Normal <1.22):  1.03 Lung/Heart Ratio (Normal <0.45):  0.28 QGS EDV:  142 ml QGS ESV:  66 ml LV Ejection Fraction: 53%  Rest ECG: NSR at 61, inferior and lateral TWI's  Stress ECG: No significant change from baseline ECG  QPS Raw Data Images:  Normal; no motion artifact; normal heart/lung ratio. Stress Images:  Decreased uptake in the inferoapical, inferolateral and mid to distal lateral wall Rest Images:  Decreased uptake of the inferolateral wall Subtraction (SDS):  SDS 4 - borderline signficiant reversible defect of the inferoapical and mid to distal inferolateral walls, consistent with possible OM territory ischemia  Impression Exercise Capacity:  Lexiscan with no exercise. BP Response:  Hypertensive blood pressure response. Clinical Symptoms:  There is dyspnea. ECG Impression:  No significant ST segment change suggestive of ischemia. Comparison with Prior Nuclear Study: No previous nuclear study performed  Overall Impression:  Intermediate risk stress nuclear study with small, but reversible (SDS 4, Extent 4%) ischemia of the inferior apex, apical lateral and mid to distal lateral walls. This could be consistent with OM territory ischemia. Clinical correlation is recommended.  LV Wall Motion:  Mild inferolateral hypokinesis. EF 53%.  Pixie Casino, MD, [facility] Board Certified in Nuclear Cardiology Attending Cardiologist [facility], MD  03/06/2013 1:47 PM

## 2013-03-11 ENCOUNTER — Other Ambulatory Visit: Payer: Self-pay | Admitting: *Deleted

## 2013-03-11 DIAGNOSIS — M19019 Primary osteoarthritis, unspecified shoulder: Secondary | ICD-10-CM

## 2013-03-11 MED ORDER — HYDROCODONE-ACETAMINOPHEN 7.5-325 MG PO TABS: 1.0000 | ORAL_TABLET | ORAL | Status: DC | PRN

## 2013-03-25 ENCOUNTER — Other Ambulatory Visit: Payer: Self-pay | Admitting: Cardiovascular Disease

## 2013-03-25 ENCOUNTER — Encounter: Payer: Self-pay | Admitting: Cardiology

## 2013-03-25 ENCOUNTER — Other Ambulatory Visit: Payer: Self-pay | Admitting: *Deleted

## 2013-03-25 ENCOUNTER — Encounter: Payer: Self-pay | Admitting: Cardiovascular Disease

## 2013-03-25 DIAGNOSIS — Z01812 Encounter for preprocedural laboratory examination: Secondary | ICD-10-CM

## 2013-03-25 LAB — COMPREHENSIVE METABOLIC PANEL
ALT: 17 U/L (ref 0–53)
Albumin: 4 g/dL (ref 3.5–5.2)
CO2: 30 mEq/L (ref 19–32)
Chloride: 106 mEq/L (ref 96–112)
Glucose, Bld: 91 mg/dL (ref 70–99)
Potassium: 4.3 mEq/L (ref 3.5–5.3)
Sodium: 144 mEq/L (ref 135–145)
Total Protein: 7 g/dL (ref 6.0–8.3)

## 2013-03-25 LAB — CBC WITH DIFFERENTIAL/PLATELET
Basophils Absolute: 0 10*3/uL (ref 0.0–0.1)
HCT: 38.3 % — ABNORMAL LOW (ref 39.0–52.0)
Lymphocytes Relative: 37 % (ref 12–46)
Monocytes Absolute: 0.5 10*3/uL (ref 0.1–1.0)
Neutro Abs: 1.6 10*3/uL — ABNORMAL LOW (ref 1.7–7.7)
Neutrophils Relative %: 45 % (ref 43–77)
Platelets: 172 10*3/uL (ref 150–400)
RDW: 19.1 % — ABNORMAL HIGH (ref 11.5–15.5)
WBC: 3.5 10*3/uL — ABNORMAL LOW (ref 4.0–10.5)

## 2013-03-25 LAB — APTT: aPTT: 32 seconds (ref 24–37)

## 2013-03-25 MED ORDER — VALSARTAN 40 MG PO TABS: 40.0000 mg | ORAL_TABLET | Freq: Every day | ORAL | Status: DC

## 2013-03-26 LAB — URINALYSIS, MICROSCOPIC ONLY
Casts: NONE SEEN
Crystals: NONE SEEN
Squamous Epithelial / LPF: NONE SEEN

## 2013-03-26 LAB — URINALYSIS, ROUTINE W REFLEX MICROSCOPIC
Hgb urine dipstick: NEGATIVE
Ketones, ur: NEGATIVE mg/dL
Leukocytes, UA: NEGATIVE
Nitrite: NEGATIVE
Protein, ur: 30 mg/dL — AB

## 2013-03-26 LAB — TSH: TSH: 1.029 u[IU]/mL (ref 0.350–4.500)

## 2013-03-27 ENCOUNTER — Encounter (HOSPITAL_COMMUNITY): Payer: Self-pay | Admitting: Pharmacy Technician

## 2013-03-28 ENCOUNTER — Ambulatory Visit (HOSPITAL_COMMUNITY)
Admission: RE | Admit: 2013-03-28 | Discharge: 2013-03-28 | Disposition: A | Discharge status: Home / Self Care | Payer: Medicare Other | Source: Ambulatory Visit | Attending: Cardiology | Admitting: Cardiology

## 2013-03-28 ENCOUNTER — Encounter (HOSPITAL_COMMUNITY)
Admission: RE | Disposition: A | Discharge status: Home / Self Care | Payer: Self-pay | Source: Ambulatory Visit | Attending: Cardiology

## 2013-03-28 ENCOUNTER — Encounter (HOSPITAL_COMMUNITY): Payer: Self-pay | Admitting: Cardiology

## 2013-03-28 ENCOUNTER — Ambulatory Visit (HOSPITAL_COMMUNITY): Payer: Medicare Other

## 2013-03-28 DIAGNOSIS — Z794 Long term (current) use of insulin: Secondary | ICD-10-CM | POA: Insufficient documentation

## 2013-03-28 DIAGNOSIS — E782 Mixed hyperlipidemia: Secondary | ICD-10-CM | POA: Diagnosis present

## 2013-03-28 DIAGNOSIS — E785 Hyperlipidemia, unspecified: Secondary | ICD-10-CM | POA: Diagnosis present

## 2013-03-28 DIAGNOSIS — E119 Type 2 diabetes mellitus without complications: Secondary | ICD-10-CM

## 2013-03-28 DIAGNOSIS — R9431 Abnormal electrocardiogram [ECG] [EKG]: Secondary | ICD-10-CM | POA: Insufficient documentation

## 2013-03-28 DIAGNOSIS — E1122 Type 2 diabetes mellitus with diabetic chronic kidney disease: Secondary | ICD-10-CM | POA: Diagnosis present

## 2013-03-28 DIAGNOSIS — I2510 Atherosclerotic heart disease of native coronary artery without angina pectoris: Secondary | ICD-10-CM | POA: Insufficient documentation

## 2013-03-28 DIAGNOSIS — Z01812 Encounter for preprocedural laboratory examination: Secondary | ICD-10-CM

## 2013-03-28 DIAGNOSIS — I10 Essential (primary) hypertension: Secondary | ICD-10-CM | POA: Diagnosis present

## 2013-03-28 DIAGNOSIS — R9430 Abnormal result of cardiovascular function study, unspecified: Secondary | ICD-10-CM

## 2013-03-28 DIAGNOSIS — R9439 Abnormal result of other cardiovascular function study: Secondary | ICD-10-CM | POA: Diagnosis present

## 2013-03-28 HISTORY — PX: LEFT HEART CATHETERIZATION WITH CORONARY ANGIOGRAM: SHX5451

## 2013-03-28 LAB — BASIC METABOLIC PANEL
CO2: 27 mEq/L (ref 19–32)
Chloride: 105 mEq/L (ref 96–112)
Glucose, Bld: 91 mg/dL (ref 70–99)
Potassium: 3.8 mEq/L (ref 3.5–5.1)
Sodium: 141 mEq/L (ref 135–145)

## 2013-03-28 SURGERY — LEFT HEART CATHETERIZATION WITH CORONARY ANGIOGRAM
Anesthesia: LOCAL

## 2013-03-28 MED ORDER — CLOPIDOGREL BISULFATE 75 MG PO TABS
75.0000 mg | ORAL_TABLET | Freq: Every day | ORAL | Status: DC
  Administered 2013-03-28: 75 mg via ORAL
  Filled 2013-03-28: qty 1

## 2013-03-28 MED ORDER — ASPIRIN 81 MG PO CHEW
324.0000 mg | CHEWABLE_TABLET | ORAL | Status: AC
Start: 2013-03-29 — End: 2013-03-28
  Administered 2013-03-28: 324 mg via ORAL
  Filled 2013-03-28: qty 4

## 2013-03-28 MED ORDER — FENTANYL CITRATE 0.05 MG/ML IJ SOLN
INTRAMUSCULAR | Status: AC
  Filled 2013-03-28: qty 2

## 2013-03-28 MED ORDER — HEPARIN (PORCINE) IN NACL 2-0.9 UNIT/ML-% IJ SOLN
INTRAMUSCULAR | Status: AC
  Filled 2013-03-28: qty 1000

## 2013-03-28 MED ORDER — ONDANSETRON HCL 4 MG/2ML IJ SOLN: 4.0000 mg | Freq: Four times a day (QID) | INTRAMUSCULAR | Status: DC | PRN

## 2013-03-28 MED ORDER — SODIUM CHLORIDE 0.9 % IV SOLN
INTRAVENOUS | Status: DC
  Administered 2013-03-28: 08:00:00 via INTRAVENOUS

## 2013-03-28 MED ORDER — LIDOCAINE HCL (PF) 1 % IJ SOLN
INTRAMUSCULAR | Status: AC
  Filled 2013-03-28: qty 30

## 2013-03-28 MED ORDER — ACETAMINOPHEN 325 MG PO TABS: 650.0000 mg | ORAL_TABLET | ORAL | Status: DC | PRN

## 2013-03-28 MED ORDER — DIAZEPAM 5 MG PO TABS
5.0000 mg | ORAL_TABLET | ORAL | Status: AC
  Administered 2013-03-28: 5 mg via ORAL
  Filled 2013-03-28: qty 1

## 2013-03-28 MED ORDER — NITROGLYCERIN 0.2 MG/ML ON CALL CATH LAB
INTRAVENOUS | Status: AC
  Filled 2013-03-28: qty 1

## 2013-03-28 MED ORDER — HEPARIN SODIUM (PORCINE) 1000 UNIT/ML IJ SOLN
INTRAMUSCULAR | Status: AC
  Filled 2013-03-28: qty 1

## 2013-03-28 MED ORDER — SODIUM CHLORIDE 0.9 % IJ SOLN: 3.0000 mL | INTRAMUSCULAR | Status: DC | PRN

## 2013-03-28 MED ORDER — MIDAZOLAM HCL 2 MG/2ML IJ SOLN
INTRAMUSCULAR | Status: AC
  Filled 2013-03-28: qty 2

## 2013-03-28 MED ORDER — VERAPAMIL HCL 2.5 MG/ML IV SOLN
INTRAVENOUS | Status: AC
  Filled 2013-03-28: qty 2

## 2013-03-28 MED ORDER — CLOPIDOGREL BISULFATE 75 MG PO TABS: 75.0000 mg | ORAL_TABLET | Freq: Every day | ORAL | Status: DC

## 2013-03-28 NOTE — Interval H&P Note (Signed)
History and Physical Interval Note:  03/28/2013 9:09 AM  [name] [name]  has presented today for surgery, with the diagnosis of INTERMEDIATE RISK NUCLEAR STRESS TEST.  The various methods of treatment have been discussed with the patient and family. After consideration of risks, benefits and other options for treatment, the patient has consented to  Procedure(s): LEFT HEART CATHETERIZATION WITH CORONARY ANGIOGRAM (N/A) +/- PERCUTANEOUS CORONARY INTERVENTION (BALLOON &/OR STENT) as a surgical intervention .  The patient's history has been reviewed, patient examined, no change in status, stable for surgery.  I have reviewed the patient's chart and labs.  Questions were answered to the patient's satisfaction.     HARDING,DAVID W  Cath Lab Visit (complete for each Cath Lab visit)  Clinical Evaluation Leading to the Procedure:   ACS: no  Non-ACS:    Anginal Classification: No Symptoms; Exertional dyspnea  Anti-ischemic medical therapy: Minimal Therapy (1 class of medications)  Non-Invasive Test Results: Intermediate-risk stress test findings: cardiac mortality 1-3%/year  Prior CABG: No previous CABG

## 2013-03-28 NOTE — CV Procedure (Signed)
CARDIAC CATHETERIZATION REPORT  NAME:  [name] [name]   MRN: [id number] DOB:  31-Jul-1950   ADMIT DATE: 03/28/2013 Procedure Date: 03/28/2013  INTERVENTIONAL CARDIOLOGIST: Leonie Man, M.D., MS PRIMARY CARE PROVIDER: [name], MD PRIMARY CARDIOLOGIST: Rebecca Eaton., MD  PATIENT:  [name] [name] is a 62 y.o. male with history of hypertension, hyperlipidemia as well as diabetes on insulin.  He was referred to Dr. Rollene Fare for evaluation of an abnormal ECG.  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 He was evaluated with an echocardiogram and a nuclear stress test.  Stress test was read as intermediate risk with inferior apical apical lateral reversible defect suggestive of ischemia.  He is now referred for invasive evaluation. His baseline creatinine was roughly 1.8, he was admitted for pre-hydration and his creatinine was stable at 1.75 prior to catheterization.  For this reason lethargy life he was not performed.  PRE-OPERATIVE DIAGNOSIS:    Abnormal Nuclear Stress Test- Intermediate Risk  Abnormal ECG  PROCEDURES PERFORMED:    Left Heart Catheterization with Coronary Angiography  PROCEDURE:Consent:  Risks of procedure as well as the alternatives and risks of each were explained to the (patient/caregiver).  Consent for procedure obtained. Consent for signed by MD and patient with RN witness -- placed on chart.   PROCEDURE: The patient was brought to the [facility] in the fasting state and prepped and draped in the usual sterile fashion for Right groin or radial access. A modified Allen's test with plethysmography was performed, revealing excellent Ulnar artery collateral flow.  Sterile technique was used including antiseptics, cap, gloves, gown, hand hygiene, mask and sheet.  Skin prep: Chlorhexidine.  Time  Out: Verified patient identification, verified procedure, site/side was marked, verified correct patient position, special equipment/implants available, medications/allergies/relevent history reviewed, required imaging and test results available.  Performed  Access: Right Radial Artery; 6 Fr Sheath -- Seldinger technique (Angiocath Micropuncture Kit)  Radial Cocktail - 10 mL intra-arterial, IV Heparin - 5000 units Diagnostic Angiography:  TIG 4.0 advanced over Versicore wire used for Left and Right Coronary Angiography as well as Left Ventricular Hemodynamics.  TR Band: 1430 Hours, 12 mL air  MEDICATIONS:  Anesthesia:  Local Lidocaine 2 ml  Sedation:  2 mg IV Versed, 50 mcg IV fentanyl ;   Premedication: 2 mg oral Valium  Omnipaque Contrast: 60 ml  Anticoagulation:  IV Heparin 5000 Units   Hemodynamics:  Central Aortic / Mean Pressures: 112/79 mmHg; 93 mmHg  Left Ventricular Pressures / EDP: 15/6 mmHg; 18 mmHg  Left Ventriculography: Not performed  Coronary Anatomy:  Left Main: Large-caliber vessel that bifurcates into the LAD and Circumflex; angiographically normal LAD: Large-caliber vessel with a proximal area irregularities it appears to have 2 small blebs the the appearance of potentially an occluded vessel however there is no vessel that would go in this distribution.  This is most likely consistent with small aneurysmal bleb beyond that area the vessel and he is on the smooth vessel that wraps around the apex.  It gives rise to a proximal and mid diagonal branch that also free of disease.  Left Circumflex: Large-caliber vessel that gives rise to a proximal OM which courses as a ramus intermedius.  In the AV groove the vessel gives off 3-4 small to moderate caliber it is marginal for terminates as a left posterolateral  branch with.  Left Circumflex Artery system was free of any significant disease.   RCA: Large caliber, dominant vessel with mild luminal irregularities as  it gives off several RV marginal branch before bifurcating distally into the right posterior descending artery That (RPDA and the  Right Posterior AV Groove Branch (RPAV).  Angiographically normal with mild luminal irregularities.  RPDA: Moderate large-caliber vessel which courses almost the apex.  Minimal  RPL Sysytem:The RPAV large-caliber vessel it bifurcates into 2 RPL branches.  RPL 1 is a major vessel which courses for along the inferolateral/posterolateral wall.  RPL to is actually somewhat smaller and does not have quite as extensive distribution.  Angiographically normal  PATIENT DISPOSITION:    The patient was transferred to the PACU holding area in a hemodynamicaly stable, chest pain free condition.  The patient tolerated the procedure well, and there were no complications.  EBL:   < 5 ml  The patient was stable before, during, and after the procedure.  POST-OPERATIVE DIAGNOSIS:    Minimal coronary disease with 1 concerning area in the proximal LAD, that may have well been healing plaque.  Otherwiseessentially normal coronaries.  Well-preserved ejection fraction by echocardiogram.  PLAN OF CARE:  Standard post radial cath care with plan to discharge later on today.  I will add Plavix to his aspirin, statin another regimen for  stabilization of a possible irregular plaque in the proximal LAD.  This can be stopped at any time as the circumflex procedures.  May consider actually using Plavix alone as opposed to aspirin.   the case was discussed with Dr. Rollene Fare.   Leonie Man, M.D., M.S. [facility] [address] [place], [place]  [address]  [phone]  03/28/2013 2:35 PM

## 2013-03-28 NOTE — H&P (Signed)
H&P from Dr. Rollene Fare  PATIENT: [name] [name]  DOB: 1950-11-01 DOV: 03/25/2013   The patient is a 62 year old African American married father of 2 with 3 grandchildren. He is referred for abnormal EKG. He has had a history of diabetes since 1999 and has been on insulin for about 3 or 4 years. He also has had hypertension since 1999 and hyperlipidemia, on statin therapy. There is a family history of heart disease and diabetes as outlined in the chart. He has not had any recent hospitalizations or ER visits. He has not had any chest pain, symptoms of ischemia or arrhythmia. His activity level is good and he walks for 30 minutes 3 times a week. He is very compliant with his medications. He is not having any problems on statin.   CURRENT MEDICATIONS: 1. Simvastatin 20.  2. Amlodipine 10.  3. Omeprazole 20.  4. Humalog daily.  5. Lantus daily.  6. Symbicort p.r.n.  7. Nasonex p.r.n.   Past Medical History  Diagnosis Date  . COPD (chronic obstructive pulmonary disease)   . Sinusitis   . PUD (peptic ulcer disease)     remote, reports f/u EGD about 8 years ago unremarkable   . Diabetes   . GERD (gastroesophageal reflux disease)   . Hyperlipidemia   . Hypertension   . Sleep apnea   . Post traumatic stress disorder   . Anxiety   . Depression   . Bell palsy    History   Social History  . Marital Status: Married    Spouse Name: N/A    Number of Children: 2  . Years of Education: 12th grade   Occupational History  . unemployed    . 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     Social History Main Topics  . Smoking status: Former Smoker -- 1.00 packs/day for 25 years    Types: Cigarettes    Quit date: 03/27/2010  . Smokeless tobacco: Not on file  . Alcohol Use: No     Comment: stopped feb 2011  . Drug Use: Yes    Special: Cocaine     Comment: stopped feb2011  . Sexual Activity: Not on file   Other Topics Concern  . Not  on file   Social History Narrative   He quit smoking in 2010. 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 He developed pulmonary problems, became disabled because of lower airway disease in 2009.    Family History: family history includes Arthritis in an other family member; Asthma in an other family member; Cancer in his mother; Diabetes in his father, mother, and sister; Hypertension in his father, mother, and sister; Lung disease in an other family member. There is no history of Anesthesia problems, Hypotension, Malignant hyperthermia, or Pseudochol deficiency.  ROS: Comprehensive review was negative if not noted above.  PHYSICAL EXAMINATION: He is euthyroid appearing, his height 5 feet 4 inches, weight 232, mesomorphic habitus, muscular appearing, and appears his stated age of 72. Blood pressure is 130/80. His chest is clear to P and A, there are mildly diminished breath sounds but no wheezes, rubs, or rhonchi. No carotid bruits, no xanthelasmas, PERRLA.  PMI is in the left 5th to 6th ICS in the MCL, there is a mildly sustained heave, S4 is audible but not palpable and there is no S3, and there is a benign-sounding [id number] systolic murmur at the  left sternal border, no diastolic murmur or rubs, good carotid upstroke.  Liver, spleen, and kidney are not palpable, no flank bruits, bowel sounds normal.  Femorals intact, DP and PT intact, no edema. He is comfortable at 10-20 degrees.  Skin is warm and dry and he is afebrile.  Outpatient workup included:  2D echo which showed normal EF with moderate concentric hypertrophy. He had mild aortic sclerosis with no stenosis, no significant valve disease, and mildly dilated left atrium. IVS and LVPW were both 14.8 and 14.2 mm respectively.   Outpatient Myoview of March 06, 2013, done by [facility] protocol showed intermediate risk with small but reversible ischemic area of the inferior apex, apical lateral and mid and  distal lateral walls. This was felt to be possibly consistent with circumflex/OM territory.   EKG shows sinus rhythm, horizontal frontal axis with first-degree heart block, PR interval 0.22 and inferolateral T-wave changes and increased voltage compatible with LVH.   LABORATORY: February 14, 2013, showed a normal CBC and differential, BUN 19 and creatinine 1.8, normal LFTs; cholesterol 116, LDL 56, and HDL 38.    IMPRESSION AND PLAN: The patient is essentially asymptomatic with abnormal Myoview. He has significant risk factors for coronary disease, and particularly with his diabetes/cigarettes and family history along with hypertension I would recommend diagnostic catheterization in this setting. I have discussed this with him and I will go over the risks, benefits, and alternatives to this. His blood pressure is under good control now. We may consider putting him on an ARB, particularly in view of his diabetes and associated hypertension.   For Richard A. Rollene Fare, MD, [facility]  Leonie Man, M.D., M.S. [facility] [address] [place], [place]  [address]  [phone] Pager # [phone] 03/28/2013

## 2013-05-08 ENCOUNTER — Telehealth: Payer: Self-pay | Admitting: Orthopedic Surgery

## 2013-05-08 ENCOUNTER — Other Ambulatory Visit: Payer: Self-pay | Admitting: Orthopedic Surgery

## 2013-05-08 DIAGNOSIS — M19019 Primary osteoarthritis, unspecified shoulder: Secondary | ICD-10-CM

## 2013-05-08 MED ORDER — HYDROCODONE-ACETAMINOPHEN 7.5-325 MG PO TABS: 1.0000 | ORAL_TABLET | ORAL | Status: DC | PRN

## 2013-05-08 NOTE — Telephone Encounter (Signed)
[name] wants a prescription for Hydrocodone

## 2013-05-12 NOTE — Telephone Encounter (Signed)
Patient picked up the prescription 05/08/13

## 2013-06-04 ENCOUNTER — Ambulatory Visit: Payer: Self-pay | Admitting: Orthopedic Surgery

## 2013-06-05 ENCOUNTER — Encounter: Payer: Self-pay | Admitting: Orthopedic Surgery

## 2013-06-05 ENCOUNTER — Ambulatory Visit (INDEPENDENT_AMBULATORY_CARE_PROVIDER_SITE_OTHER): Payer: Medicare Other

## 2013-06-05 ENCOUNTER — Ambulatory Visit (INDEPENDENT_AMBULATORY_CARE_PROVIDER_SITE_OTHER): Payer: Medicare Other | Admitting: Orthopedic Surgery

## 2013-06-05 VITALS — BP 142/98 | Ht 64.0 in | Wt 238.0 lb

## 2013-06-05 DIAGNOSIS — IMO0002 Reserved for concepts with insufficient information to code with codable children: Secondary | ICD-10-CM

## 2013-06-05 DIAGNOSIS — M179 Osteoarthritis of knee, unspecified: Secondary | ICD-10-CM

## 2013-06-05 DIAGNOSIS — M1710 Unilateral primary osteoarthritis, unspecified knee: Secondary | ICD-10-CM

## 2013-06-05 DIAGNOSIS — M549 Dorsalgia, unspecified: Secondary | ICD-10-CM

## 2013-06-05 DIAGNOSIS — G8929 Other chronic pain: Secondary | ICD-10-CM

## 2013-06-05 NOTE — Patient Instructions (Addendum)
Patient needs to come in at 9:15 Tuesday Call to arrange therapy for the back

## 2013-06-05 NOTE — Progress Notes (Signed)
Subjective:     Patient ID: [name] [name], male   DOB: 1950-09-06, 62 y.o.   MRN: [id number] Chief Complaint  Patient presents with  . Follow-up    Yearly recheck left knee with xray  . Hand Pain    Right index finger  . Back Pain     left-sided back and hip pain  . Knee Problem    Osteoarthritis left knee here for x-ray    Hand Pain   Back Pain   patient complains of swelling over the PIP joint of the right index finger with no history of trauma. He reports decreased range of motion and pain along the joint line. He denies catching locking or giving way  The patient complains of left-sided back pain and hip pain radiating down into the proximal thigh without distal radiation numbness or tingling denies back injury other than 20 years ago. We have an x-ray of his back from 2010 which showed no degenerative changes or disc space narrowing  Patient has pain in his left knee from chronic osteoarthritis  And he also has chronic pain management by hydrocodone 10 mg every 4 when necessary as needed   Review of Systems  Musculoskeletal: Positive for back pain.   as above     Objective:   Physical Exam Physical Exam(12)  Vital signs: BP 142/98  Ht 5\' 4"  (1.626 m)  Wt 238 lb (107.956 kg)  BMI 40.83 kg/m2  1.GENERAL: normal development   2. CDV: pulses are normal in the right wrist and left foot  3. Skin: normal right and left leg  4. Lymph: nodes were not palpable/normal right epitrochlear region left groin  5/6. Psychiatric: awake, alert and oriented, mood and affect normal   7. Neuro: normal sensation right and left leg  8.   MSK  Gait: Unsupported no limp   Right index finger shows a swollen PIP joint with what appears to be osteoarthritic changes of Heberden's nodes. Decreased range of motion. A1 pulley nontender no clicking no deformity normal flexion strength  Tenderness in the left lower lumbar spine with normal range of motion in the left hip  Knee flexion  125, full extension (maybe a slight flexion contracture) medial joint line tenderness mild joint stability confirmed by drawer test. Motor exam normal.  Imaging the x-ray shows moderate arthritis medial compartment the  Assessment:  Encounter Diagnoses  Name Primary?  . OA (osteoarthritis) of knee   . Back pain Yes  . Chronic pain        Plan: Recommend physical therapy for his lumbar spine Recommend pain management for chronic pain Recommend x-ray and evaluation right index finger next week     Assessment:        Plan:

## 2013-06-10 ENCOUNTER — Ambulatory Visit (INDEPENDENT_AMBULATORY_CARE_PROVIDER_SITE_OTHER): Payer: Medicare Other

## 2013-06-10 ENCOUNTER — Ambulatory Visit (INDEPENDENT_AMBULATORY_CARE_PROVIDER_SITE_OTHER): Payer: Medicare Other | Admitting: Orthopedic Surgery

## 2013-06-10 ENCOUNTER — Ambulatory Visit (HOSPITAL_COMMUNITY)
Admission: RE | Admit: 2013-06-10 | Discharge: 2013-06-10 | Disposition: A | Discharge status: Home / Self Care | Payer: Medicare Other | Source: Ambulatory Visit | Attending: Orthopedic Surgery | Admitting: Orthopedic Surgery

## 2013-06-10 VITALS — BP 152/98 | Ht 64.0 in | Wt 238.0 lb

## 2013-06-10 DIAGNOSIS — M79644 Pain in right finger(s): Secondary | ICD-10-CM

## 2013-06-10 DIAGNOSIS — M19049 Primary osteoarthritis, unspecified hand: Secondary | ICD-10-CM | POA: Insufficient documentation

## 2013-06-10 DIAGNOSIS — Z4689 Encounter for fitting and adjustment of other specified devices: Secondary | ICD-10-CM | POA: Insufficient documentation

## 2013-06-10 DIAGNOSIS — M19041 Primary osteoarthritis, right hand: Secondary | ICD-10-CM

## 2013-06-10 DIAGNOSIS — M79609 Pain in unspecified limb: Secondary | ICD-10-CM

## 2013-06-10 DIAGNOSIS — IMO0001 Reserved for inherently not codable concepts without codable children: Secondary | ICD-10-CM | POA: Insufficient documentation

## 2013-06-10 NOTE — Patient Instructions (Signed)
Call [facility] for appt to get finger splint

## 2013-06-10 NOTE — Evaluation (Signed)
Occupational Therapy Evaluation  Patient Details  Name: [name] [name] MRN: [id number] Date of Birth: [birth year]  Today's Date: 06/10/2013 Time: 0940-1000 OT Time Calculation (min): 20 min OT Evaluation 940-950 10' Splinting 950-10000 10'  Visit#: 1 of 1   Assessment Diagnosis: Right Index finger PIPJ arthritis  Past Medical History:  Past Medical History  Diagnosis Date  . COPD (chronic obstructive pulmonary disease)   . Sinusitis   . PUD (peptic ulcer disease)     remote, reports f/u EGD about 8 years ago unremarkable   . Diabetes   . GERD (gastroesophageal reflux disease)   . Hyperlipidemia   . Hypertension   . Sleep apnea   . Post traumatic stress disorder   . Anxiety   . Depression   . Bell palsy    Past Surgical History:  Past Surgical History  Procedure Laterality Date  . Asad lt shoulder  12/2008    left shoulder  . Umbilical hernia repair  2007    [place]  . Knee arthroscopy  10/2007    left  . Toenail excision      removed x2-bilateral  . Eye surgery  12/22/2010    tear duct probing-[place]  . Foreign body removal  03/29/2011    Procedure: REMOVAL FOREIGN BODY EXTREMITY;  Surgeon: Arther Abbott, MD;  Location: [facility];  Service: Orthopedics;  Laterality: Right;  Removal Foreign Body Right Thumb  . Quadriceps tendon repair  07/21/2011    Procedure: REPAIR QUADRICEP TENDON;  Surgeon: Arther Abbott, MD;  Location: [facility];  Service: Orthopedics;  Laterality: Right;    Subjective S:  My finger started bothering me for no reason a few months ago. Pertinent History: Mr. Steffek is referred to occupational therapy for a one time visit for splint fabrication with PIPJ in 20 degrees of flexion to alleviate pain in joint associated with arthritis. Patient Stated Goals: Finger to stop hurting. Pain Assessment Currently in Pain?: Yes Pain Score: 3  Pain Location: Finger (Comment which one) Pain Orientation: Right Pain Type: Acute  pain  Precautions/Restrictions  Precautions Precautions: None Restrictions Weight Bearing Restrictions: No  Balance Screening Balance Screen Has the patient fallen in the past 6 months: No Has the patient had a decrease in activity level because of a fear of falling? : No Is the patient reluctant to leave their home because of a fear of falling? : No  Prior Functioning  Prior Function Comments: lives with his wife, retired, enjoys staying Psychologist, clinical ADL/Vision/Perception ADL ADL Comments: pain in PIPJ of IF with gripping and at night Dominant Hand: Right  Cognition/Observation Cognition Overall Cognitive Status: Within Functional Limits for tasks assessed Observation/Other Assessments Observations: R IF AROM and PROM is WFL in MCPJ, PIPJ, DIPJ  Sensation/Coordination/Edema Sensation Light Touch: Appears Intact Coordination Fine Motor Movements are Fluid and Coordinated: Yes Edema Edema: PIPJ slightly swollen  Additional Assessments Palpation Palpation: slight clicking on lateral border of PIPJ of IF with AROM     Exercise/Treatments    Splinting Splinting: Splinted right index finger with volar gutter splint with MCPJ free for movement, PIPJ in 20 degrees of flexion and DIPJ in neutral.  Educated patient on donning and doffing of splint and wear and care of splint (as needed, for rest and support of PIPJ)  Patient voiced understanding of splint use.  Occupational Therapy Assessment and Plan OT Assessment and Plan Clinical Impression Statement: A:  Patient seen for one time visit for fabrication of right index finger volar gutter  splint, positioning PIPJ in 20 degrees of flexion and DIPJ in neutral for rest and support for PIPJ due to arthritic pain. Rehab Potential: Excellent OT Frequency: Min 1X/week OT Duration: 2 weeks OT Treatment/Interventions: Patient/family education;Splinting OT Plan: P:  DC from skilled OT intervention this date.     Goals Short Term Goals Short Term Goal 1: Patient will be independent with wear and care of right index finger volar gutter splint.  Short Term Goal 1 Progress: Met  Problem List Patient Active Problem List   Diagnosis Date Noted  . Arthritis pain of hand 06/10/2013  . Back pain 06/05/2013  . Abnormal nuclear stress test 03/28/2013  . Muscle weakness (generalized) 05/20/2012  . Partial nontraumatic rupture of rotator cuff 05/15/2012  . Shoulder arthritis 05/15/2012  . Rotator cuff tear, right 04/25/2012  . Knee pain 11/29/2011  . Difficulty in walking 09/04/2011  . Stiffness of joint, not elsewhere classified, lower leg 09/04/2011  . Rupture quadriceps tendon 08/03/2011  . Quadriceps tendon rupture 07/20/2011  . Foreign body of thumb, right 03/28/2011  . OA (osteoarthritis) of knee 03/15/2011  . Chronic pain 03/15/2011  . CTS (carpal tunnel syndrome) 03/15/2011  . KNEE, ARTHRITIS, DEGEN./OSTEO 09/09/2009  . DERANGEMENT MENISCUS 08/19/2009  . SHOULDER, ARTHRITIS, DEGEN./OSTEO 04/05/2009  . RUPTURE ROTATOR CUFF 12/28/2008  . BACK PAIN 10/21/2008  . DIABETES MELLITUS-TYPE II 09/29/2008  . HYPERLIPIDEMIA 09/29/2008  . HYPERTENSION 09/29/2008  . GERD 09/29/2008  . HEMATOCHEZIA 09/29/2008  . TENDINITIS, CALCIFIC, SHOULDER, LEFT 08/06/2008  . SHOULDER PAIN 06/29/2008  . IMPINGEMENT SYNDROME 06/29/2008    End of Session Activity Tolerance: Patient tolerated treatment well General Behavior During Therapy: [facility] for tasks assessed/performed OT Plan of Care OT Patient Instructions: wear and care of splint.  Consulted and Agree with Plan of Care: Patient  GO Functional Assessment Tool Used: clinical observation Functional Limitation: Carrying, moving and handling objects Carrying, Moving and Handling Objects Current Status [id number]): At least 1 percent but less than 20 percent impaired, limited or restricted Carrying, Moving and Handling Objects Goal Status [phone]): At least 1  percent but less than 20 percent impaired, limited or restricted Carrying, Moving and Handling Objects Discharge Status [phone]): At least 1 percent but less than 20 percent impaired, limited or restricted  Vangie Bicker, OTR/L  06/10/2013, 10:07 AM  Physician Documentation Your signature is required to indicate approval of the treatment plan as stated above.  Please sign and either send electronically or make a copy of this report for your files and return this physician signed original.  Please mark one 1.__approve of plan  2. ___approve of plan with the following conditions.   ______________________________                                                          _____________________ Physician Signature  Date  

## 2013-06-11 NOTE — Progress Notes (Signed)
Patient ID: [name] [name], male   DOB: 02-Sep-1950, 62 y.o.   MRN: [id number]  Chief Complaint  Patient presents with  . Follow-up    Recheck right index finger with xray.    BP 152/98  Ht 5\' 4"  (1.626 m)  Wt 238 lb (107.956 kg)  BMI 40.83 kg/m2  Encounter Diagnoses  Name Primary?  . Finger pain, right   . Degenerative arthritis of finger, right Yes    [name] presents with pain and swelling loss of motion of the index finger at the PIP joint  He does not note any trauma  Review of systems he has knee pain which is chronic he has shoulder pain which is chronic he is on hydrocodone for chronic pain  Has a swollen PIP joint with 40 of flexion it stable is not warm to touch skin intact pulses good in the wrist capillary refill is normal sensation is normal  X-ray shows obliteration of the PIP joint with surrounding osteophytes  Impression osteoarthritis PIP joint right index finger  Treatment options include oral NSAIDs, splinting, joint replacement or fusion of the joint  He is opted for splinting so he'll be sent to the hospital for splint

## 2013-06-16 ENCOUNTER — Telehealth: Payer: Self-pay | Admitting: *Deleted

## 2013-06-16 ENCOUNTER — Ambulatory Visit (HOSPITAL_COMMUNITY)
Admission: RE | Admit: 2013-06-16 | Discharge: 2013-06-16 | Disposition: A | Discharge status: Home / Self Care | Payer: Medicare Other | Source: Ambulatory Visit | Attending: Internal Medicine | Admitting: Internal Medicine

## 2013-06-16 ENCOUNTER — Other Ambulatory Visit: Payer: Self-pay | Admitting: *Deleted

## 2013-06-16 ENCOUNTER — Ambulatory Visit: Payer: Medicare Other | Admitting: Orthopedic Surgery

## 2013-06-16 DIAGNOSIS — IMO0001 Reserved for inherently not codable concepts without codable children: Secondary | ICD-10-CM | POA: Insufficient documentation

## 2013-06-16 DIAGNOSIS — G894 Chronic pain syndrome: Secondary | ICD-10-CM

## 2013-06-16 DIAGNOSIS — M545 Low back pain, unspecified: Secondary | ICD-10-CM

## 2013-06-16 DIAGNOSIS — Z4689 Encounter for fitting and adjustment of other specified devices: Secondary | ICD-10-CM | POA: Insufficient documentation

## 2013-06-16 DIAGNOSIS — G8929 Other chronic pain: Secondary | ICD-10-CM | POA: Insufficient documentation

## 2013-06-16 NOTE — Evaluation (Signed)
Physical Therapy Evaluation  Patient Details  Name: [name] [name] MRN: [id number] Date of Birth: May 28, 1951  Today's Date: 06/16/2013 Time: 0850-0922 PT Time Calculation (min): 32 min Charges: 1 evaluation              Visit#: 1 of 10  Re-eval:   Assessment Diagnosis: Low back pain Next MD Visit: Dr. Aline Brochure - unscheduled  Authorization: [employer] medicare    Authorization Time Period:    Authorization Visit#: 1 of 10   Past Medical History:  Past Medical History  Diagnosis Date  . COPD (chronic obstructive pulmonary disease)   . Sinusitis   . PUD (peptic ulcer disease)     remote, reports f/u EGD about 8 years ago unremarkable   . Diabetes   . GERD (gastroesophageal reflux disease)   . Hyperlipidemia   . Hypertension   . Sleep apnea   . Post traumatic stress disorder   . Anxiety   . Depression   . Bell palsy    Past Surgical History:  Past Surgical History  Procedure Laterality Date  . Asad lt shoulder  12/2008    left shoulder  . Umbilical hernia repair  2007    [place]  . Knee arthroscopy  10/2007    left  . Toenail excision      removed x2-bilateral  . Eye surgery  12/22/2010    tear duct probing-[place]  . Foreign body removal  03/29/2011    Procedure: REMOVAL FOREIGN BODY EXTREMITY;  Surgeon: Arther Abbott, MD;  Location: [facility];  Service: Orthopedics;  Laterality: Right;  Removal Foreign Body Right Thumb  . Quadriceps tendon repair  07/21/2011    Procedure: REPAIR QUADRICEP TENDON;  Surgeon: Arther Abbott, MD;  Location: [facility];  Service: Orthopedics;  Laterality: Right;    Subjective Symptoms/Limitations Symptoms: Pt is a 62 year old male referred to PT for low back pain.  The patient complains of left-sided back pain and hip pain radiating down into the proximal thigh without distal radiation numbness or tingling denies back injury other than 20 years ago and reports that it is getting worse every year. We have an x-ray of his back from 2010 which  showed no degenerative changes or disc space narrowing.  Pt reports that the pain is worse at night time.  Pertinent History: Quad tendon repair, distal Rt hand PIP pain, lower airway disease Limitations: Sitting;Standing;Walking;Lifting;House hold activities How long can you sit comfortably?: 15-20 minutes How long can you stand comfortably?: 15-20 minutes How long can you walk comfortably?: was walking 2 miles a day with pain Patient Stated Goals: decrease pain.  Pain Assessment Currently in Pain?: Yes Pain Score: 6  Pain Location: Back Pain Orientation: Left;Distal Pain Type: Chronic pain Pain Frequency: Constant Pain Relieving Factors: Tyleonol, hydrocodone (everyday) Effect of Pain on Daily Activities: lifting, walking  Precautions/Restrictions  Precautions Precautions: None  Balance Screening Balance Screen Has the patient fallen in the past 6 months: No Has the patient had a decrease in activity level because of a fear of falling? : No Is the patient reluctant to leave their home because of a fear of falling? : No  Prior Functioning  Prior Function Level of Independence: Independent with basic ADLs Vocation: On disability Comments: lives with his wife, retired, enjoys staying Furniture conservator/restorer  Cognition/Observation Observation/Other Assessments Observations: Lt anterior sacrolilac rotation, Lt leg shorter by 1/2 cm Other Assessments: decreased Rt hip IR  Sensation/Coordination/Flexibility/Functional Tests Coordination Gross Motor Movements are Fluid and Coordinated: No Coordination and  Movement Description: impaired transverse abdominus and multifidus activation Flexibility 90/90: Positive Functional Tests Functional Tests: + Lt ASLR Functional Tests: FOTO: 43/57  Assessment LLE Assessment LLE Assessment: Within Functional Limits Lumbar AROM Lumbar Flexion: decreased 50% Lumbar Extension: decreased 25%  (pain) Lumbar - Right Side Bend: decreased 30% Lumbar - Left  Side Bend: decreased 30%  (pain) Lumbar - Right Rotation: decreased 50% Lumbar - Left Rotation: decreased 50% (most pain) Palpation Palpation: unable to palpate pain.  Muscle spasms to Lt lumbar paraspinals with decreased PA mobility to L3-S1 spinous processes  Mobility/Balance  Ambulation/Gait Ambulation/Gait: Yes Assistive device: None Gait Pattern: Antalgic;Narrow base of support Posture/Postural Control Posture/Postural Control: Postural limitations Postural Limitations: protruding abdomen, mild slouched posture     Exercise/Treatments Aerobic Tread Mill: 1.5 mph 2% inclide Standing Functional Squats: 10 reps;Limitations Functional Squats Limitations: PT facilaition Side Lunge: 10 reps;Limitations Side Lunge Limitations: BLE  Manual Therapy Manual Therapy: Other (comment) Other Manual Therapy: Muscle Energy Technique (MET) to correct Lt sacroiliac (SI) dysfunction   Physical Therapy Assessment and Plan PT Assessment and Plan Pt will benefit from skilled therapeutic intervention in order to improve on the following deficits: Abnormal gait;Pain;Decreased coordination;Impaired perceived functional ability;Increased muscle spasms;Decreased range of motion;Decreased mobility;Improper spinal/pelvic alignment;Cardiopulmonary status limiting activity Rehab Potential: Good PT Frequency: Min 3X/week (3x/week x2 weeks, 2x/wk x2 weeks) PT Duration: 4 weeks PT Treatment/Interventions: Therapeutic exercise;Therapeutic activities;Manual techniques;Neuromuscular re-education;Gait training;Patient/family education PT Plan: MET for SI correction, core stabilization exercises (standing, supine, s/l and prone).  By 3-4th visit introduce theraband and flexibility.      Goals Home Exercise Program Pt/caregiver will Perform Home Exercise Program: Independently PT Goal: Perform Home Exercise Program - Progress: Met PT Short Term Goals Time to Complete Short Term Goals: 2 weeks PT Short Term  Goal 1: Pt will present with normalized SI alignment to report pain less than 3/10 at night time.  PT Short Term Goal 2: Pt will require at most min cueing for core muscle activitiation to progress towards greater ease with seated and standing activities to improve respiratory funciton.  PT Short Term Goal 3: Pt will be educated on SI self correction for home for a maintence program.  PT Long Term Goals Time to Complete Long Term Goals: 4 weeks PT Long Term Goal 1: Pt will improve core strength to [facility] in order to tolerate walking 2 miles with pain less than a 3/10 for improved QOL.  PT Long Term Goal 2: Pt will improve FOTO to status greater than 56% and limiation less than 44% for improved percieved functional ability.   Problem List Patient Active Problem List   Diagnosis Date Noted  . Chronic low back pain 06/16/2013  . Arthritis pain of hand 06/10/2013  . Back pain 06/05/2013  . Abnormal nuclear stress test 03/28/2013  . Muscle weakness (generalized) 05/20/2012  . Partial nontraumatic rupture of rotator cuff 05/15/2012  . Shoulder arthritis 05/15/2012  . Rotator cuff tear, right 04/25/2012  . Knee pain 11/29/2011  . Difficulty in walking 09/04/2011  . Stiffness of joint, not elsewhere classified, lower leg 09/04/2011  . Rupture quadriceps tendon 08/03/2011  . Quadriceps tendon rupture 07/20/2011  . Foreign body of thumb, right 03/28/2011  . OA (osteoarthritis) of knee 03/15/2011  . Chronic pain 03/15/2011  . CTS (carpal tunnel syndrome) 03/15/2011  . KNEE, ARTHRITIS, DEGEN./OSTEO 09/09/2009  . DERANGEMENT MENISCUS 08/19/2009  . SHOULDER, ARTHRITIS, DEGEN./OSTEO 04/05/2009  . RUPTURE ROTATOR CUFF 12/28/2008  . BACK PAIN 10/21/2008  . DIABETES MELLITUS-TYPE II  09/29/2008  . HYPERLIPIDEMIA 09/29/2008  . HYPERTENSION 09/29/2008  . GERD 09/29/2008  . HEMATOCHEZIA 09/29/2008  . TENDINITIS, CALCIFIC, SHOULDER, LEFT 08/06/2008  . SHOULDER PAIN 06/29/2008  . IMPINGEMENT SYNDROME  06/29/2008    PT - End of Session Activity Tolerance: Patient tolerated treatment well PT Plan of Care PT Home Exercise Plan: given PT Patient Instructions: SI dysfunction, importance of core stability.  Consulted and Agree with Plan of Care: Patient  GP Functional Assessment Tool Used: FOTO 43/57 Functional Limitation: Other PT primary Other PT Primary Current Status [id number]): At least 40 percent but less than 60 percent impaired, limited or restricted Other PT Primary Goal Status [id number]): At least 40 percent but less than 60 percent impaired, limited or restricted  LISA MASSIE, MPT, ATC 06/16/2013, 1:14 PM  Physician Documentation Your signature is required to indicate approval of the treatment plan as stated above.  Please sign and either send electronically or make a copy of this report for your files and return this physician signed original.   Please mark one 1.__approve of plan  2. ___approve of plan with the following conditions.   ______________________________                                                          _____________________ Physician Signature                                                                                                             Date sbnr

## 2013-06-16 NOTE — Telephone Encounter (Signed)
Faxed office notes and referral to Dr. [name] for pain management. Awaiting appointment.

## 2013-06-18 ENCOUNTER — Ambulatory Visit (HOSPITAL_COMMUNITY)
Admission: RE | Admit: 2013-06-18 | Discharge: 2013-06-18 | Disposition: A | Discharge status: Home / Self Care | Payer: Medicare Other | Source: Ambulatory Visit | Attending: Internal Medicine | Admitting: Internal Medicine

## 2013-06-18 DIAGNOSIS — G8929 Other chronic pain: Secondary | ICD-10-CM

## 2013-06-18 DIAGNOSIS — M545 Other chronic pain: Secondary | ICD-10-CM

## 2013-06-18 NOTE — Progress Notes (Signed)
Physical Therapy Treatment Patient Details  Name: SHRIHAN DICOLA MRN: [id number] Date of Birth: 15-May-1951  Today's Date: 06/18/2013 Time: 0802-0845 PT Time Calculation (min): 43 min Charge: Manual [id number], TE [id number]  Visit#: 2 of 10  Re-eval:   Assessment Diagnosis: Low back pain Next MD Visit: Dr. Aline Brochure - unscheduled  Authorization: [employer] medicare  Authorization Time Period:    Authorization Visit#: 2 of 10   Subjective: Symptoms/Limitations Symptoms: Pt reports increased lower back pain Lt side. Pain Assessment Currently in Pain?: Yes Pain Score: 5  Pain Location: Back Pain Orientation: Left;Lower  Precautions/Restrictions  Precautions Precautions: None  Exercise/Treatments Aerobic Tread Mill: 1.5 mph 3% inclide Standing Functional Squats: 10 reps;3 seconds Forward Lunge: 10 reps;5 seconds;Limitations Forward Lunge Limitations: Bil LE Side Lunge: 10 reps;Limitations Side Lunge Limitations: BLE Seated Other Seated Lumbar Exercises: PFC 3x 10" Supine Ab Set: 10 reps;5 seconds;Limitations AB Set Limitations: TrA and PFC with verbal and tactile cueing Bent Knee Raise: 10 reps;5 seconds Bridge: 10 reps  Manual Therapy Manual Therapy: Other (comment) Other Manual Therapy: Muscle Energy Technique (MET) to correct Lt SI outflare followed by gait training on TM for 5 minutes to set alignment.  Physical Therapy Assessment and Plan PT Assessment and Plan Clinical Impression Statement: MET for Lt SI outflare f/b gait training on TM to set alignment; noted improved alignment with increased hip mobility, improved gait mechanics and decreased pain following manual.   Therex focus on improving core musculature activaition, multimodal cueing forTrA and PFC contraction  Pt with good form and technique complete following cueing and demonstration wtih all exercises today.  Pt reported LBP resolved, did c/o Rt knee pain during standing activities.  Pt encouraged to drink  extras water today to reduce risk of headaches following manual. PT Plan: MET for SI correction, core stabilization exercises (standing, supine, s/l and prone).  By 3-4th visit introduce theraband and flexibility.      Goals Home Exercise Program Pt/caregiver will Perform Home Exercise Program: Independently PT Short Term Goals Time to Complete Short Term Goals: 2 weeks PT Short Term Goal 1: Pt will present with normalized SI alignment to report pain less than 3/10 at night time.  PT Short Term Goal 1 - Progress: Progressing toward goal PT Short Term Goal 2: Pt will require at most min cueing for core muscle activitiation to progress towards greater ease with seated and standing activities to improve respiratory funciton.  PT Short Term Goal 2 - Progress: Progressing toward goal PT Short Term Goal 3: Pt will be educated on SI self correction for home for a maintence program.  PT Long Term Goals Time to Complete Long Term Goals: 4 weeks PT Long Term Goal 1: Pt will improve core strength to [facility] in order to tolerate walking 2 miles with pain less than a 3/10 for improved QOL.  PT Long Term Goal 2: Pt will improve FOTO to status greater than 56% and limiation less than 44% for improved percieved functional ability.   Problem List Patient Active Problem List   Diagnosis Date Noted  . Chronic low back pain 06/16/2013  . Arthritis pain of hand 06/10/2013  . Back pain 06/05/2013  . Abnormal nuclear stress test 03/28/2013  . Muscle weakness (generalized) 05/20/2012  . Partial nontraumatic rupture of rotator cuff 05/15/2012  . Shoulder arthritis 05/15/2012  . Rotator cuff tear, right 04/25/2012  . Knee pain 11/29/2011  . Difficulty in walking 09/04/2011  . Stiffness of joint, not elsewhere classified, lower leg 09/04/2011  .  Rupture quadriceps tendon 08/03/2011  . Quadriceps tendon rupture 07/20/2011  . Foreign body of thumb, right 03/28/2011  . OA (osteoarthritis) of knee 03/15/2011  .  Chronic pain 03/15/2011  . CTS (carpal tunnel syndrome) 03/15/2011  . KNEE, ARTHRITIS, DEGEN./OSTEO 09/09/2009  . DERANGEMENT MENISCUS 08/19/2009  . SHOULDER, ARTHRITIS, DEGEN./OSTEO 04/05/2009  . RUPTURE ROTATOR CUFF 12/28/2008  . BACK PAIN 10/21/2008  . DIABETES MELLITUS-TYPE II 09/29/2008  . HYPERLIPIDEMIA 09/29/2008  . HYPERTENSION 09/29/2008  . GERD 09/29/2008  . HEMATOCHEZIA 09/29/2008  . TENDINITIS, CALCIFIC, SHOULDER, LEFT 08/06/2008  . SHOULDER PAIN 06/29/2008  . IMPINGEMENT SYNDROME 06/29/2008    PT - End of Session Activity Tolerance: Patient tolerated treatment well General Behavior During Therapy: [facility] for tasks assessed/performed  GP Functional Assessment Tool Used:    Aldona Lento 06/18/2013, 8:48 AM

## 2013-06-20 ENCOUNTER — Ambulatory Visit (HOSPITAL_COMMUNITY)
Admission: RE | Admit: 2013-06-20 | Discharge: 2013-06-20 | Disposition: A | Discharge status: Home / Self Care | Payer: Medicare Other | Source: Ambulatory Visit

## 2013-06-20 DIAGNOSIS — G8929 Other chronic pain: Secondary | ICD-10-CM

## 2013-06-20 DIAGNOSIS — M545 Low back pain, unspecified: Secondary | ICD-10-CM

## 2013-06-20 NOTE — Progress Notes (Signed)
Physical Therapy Treatment Patient Details  Name: [name] [name] MRN: [id number] Date of Birth: [birth year]  Today's Date: 06/20/2013 Time: 1050-1132 PT Time Calculation (min): 42 min Charge: TE [id number]  Visit#: 3 of 10  Re-eval: 07/14/13 Assessment Diagnosis: Low back pain Next MD Visit: Dr. Aline Brochure - unscheduled  Authorization: [employer] medicare  Authorization Time Period:    Authorization Visit#: 3 of 10   Subjective: Symptoms/Limitations Symptoms: Pt stated he was feeling great today, a little sore following all the exercises he completed yesterday but no real pain today.   Pain Assessment Currently in Pain?: No/denies (soreness)  Precautions/Restrictions  Precautions Precautions: None  Exercise/Treatments Standing Functional Squats: 15 reps;5 seconds Forward Lunge: 10 reps;5 seconds;Limitations Forward Lunge Limitations: Bil LE Side Lunge: 10 reps;Limitations Side Lunge Limitations: BLE Supine Ab Set: 10 reps;5 seconds;Limitations AB Set Limitations: TrA and PFC with verbal and tactile cueing Clam: 10 reps;5 seconds Bent Knee Raise: 15 reps;5 seconds Bridge: 15 reps;5 seconds     Physical Therapy Assessment and Plan PT Assessment and Plan Clinical Impression Statement: Sacroiliac joint within alignment, no MET required this session.  Progressed core strengtening and stabilty exercises to assure SI joint within alignment.  Pt complieted all exercises with good form and technique following cueing with new exercises.  No report of pain through session.   PT Plan: MET for SI correction, core stabilization exercises (standing, supine, s/l and prone).  By 3-4th visit introduce theraband and flexibility.      Goals Home Exercise Program Pt/caregiver will Perform Home Exercise Program: Independently PT Short Term Goals Time to Complete Short Term Goals: 2 weeks PT Short Term Goal 1: Pt will present with normalized SI alignment to report pain less than 3/10 at night  time.  PT Short Term Goal 1 - Progress: Progressing toward goal PT Short Term Goal 2: Pt will require at most min cueing for core muscle activitiation to progress towards greater ease with seated and standing activities to improve respiratory funciton.  PT Short Term Goal 2 - Progress: Progressing toward goal PT Short Term Goal 3: Pt will be educated on SI self correction for home for a maintence program.  PT Long Term Goals Time to Complete Long Term Goals: 4 weeks PT Long Term Goal 1: Pt will improve core strength to [facility] in order to tolerate walking 2 miles with pain less than a 3/10 for improved QOL.  PT Long Term Goal 1 - Progress: Progressing toward goal PT Long Term Goal 2: Pt will improve FOTO to status greater than 56% and limiation less than 44% for improved percieved functional ability.   Problem List Patient Active Problem List   Diagnosis Date Noted  . Chronic low back pain 06/16/2013  . Arthritis pain of hand 06/10/2013  . Back pain 06/05/2013  . Abnormal nuclear stress test 03/28/2013  . Muscle weakness (generalized) 05/20/2012  . Partial nontraumatic rupture of rotator cuff 05/15/2012  . Shoulder arthritis 05/15/2012  . Rotator cuff tear, right 04/25/2012  . Knee pain 11/29/2011  . Difficulty in walking 09/04/2011  . Stiffness of joint, not elsewhere classified, lower leg 09/04/2011  . Rupture quadriceps tendon 08/03/2011  . Quadriceps tendon rupture 07/20/2011  . Foreign body of thumb, right 03/28/2011  . OA (osteoarthritis) of knee 03/15/2011  . Chronic pain 03/15/2011  . CTS (carpal tunnel syndrome) 03/15/2011  . KNEE, ARTHRITIS, DEGEN./OSTEO 09/09/2009  . DERANGEMENT MENISCUS 08/19/2009  . SHOULDER, ARTHRITIS, DEGEN./OSTEO 04/05/2009  . RUPTURE ROTATOR CUFF 12/28/2008  . BACK  PAIN 10/21/2008  . DIABETES MELLITUS-TYPE II 09/29/2008  . HYPERLIPIDEMIA 09/29/2008  . HYPERTENSION 09/29/2008  . GERD 09/29/2008  . HEMATOCHEZIA 09/29/2008  . TENDINITIS, CALCIFIC,  SHOULDER, LEFT 08/06/2008  . SHOULDER PAIN 06/29/2008  . IMPINGEMENT SYNDROME 06/29/2008    PT - End of Session Activity Tolerance: Patient tolerated treatment well General Behavior During Therapy: [facility] for tasks assessed/performed  GP    Aldona Lento 06/20/2013, 11:34 AM

## 2013-06-23 ENCOUNTER — Ambulatory Visit (HOSPITAL_COMMUNITY)
Admission: RE | Admit: 2013-06-23 | Discharge: 2013-06-23 | Disposition: A | Discharge status: Home / Self Care | Payer: Medicare Other | Source: Ambulatory Visit | Attending: Internal Medicine | Admitting: Internal Medicine

## 2013-06-23 DIAGNOSIS — M545 Other chronic pain: Secondary | ICD-10-CM

## 2013-06-23 DIAGNOSIS — G8929 Other chronic pain: Secondary | ICD-10-CM

## 2013-06-23 NOTE — Progress Notes (Signed)
Physical Therapy Treatment Patient Details  Name: [name] [name] MRN: [id number] Date of Birth: 10/08/1950  Today's Date: 06/23/2013 Time: [id number] PT Time Calculation (min): 48 min Charge:TE [id number], 1002-1022; Manual 0950-1002,  Visit#: 4 of 10  Re-eval: 07/14/13 Assessment Diagnosis: Low back pain Next MD Visit: Dr. Aline Brochure - unscheduled  Authorization: [employer] medicare  Authorization Time Period:    Authorization Visit#: 4 of 10   Subjective: Symptoms/Limitations Symptoms: Pt stated he was feeling good today, minor pain scale 2/10 today. Pain Assessment Currently in Pain?: Yes Pain Score: 2  Pain Location: Back Pain Orientation: Left;Lower  Precautions/Restrictions  Precautions Precautions: None  Exercise/Treatments Aerobic Tread Mill: 2.0--> 2.5 mph 5% inclide.  .57 miles at 120HR Standing Functional Squats: 15 reps;5 seconds Forward Lunge: 15 reps;5 seconds;Limitations Forward Lunge Limitations: Bil LE Side Lunge: 15 reps;5 seconds;Limitations Side Lunge Limitations: BLE Scapular Retraction: Both;10 reps;Theraband Theraband Level (Scapular Retraction): Level 3 (Green) Row: 10 reps;Theraband Theraband Level (Row): Level 3 (Green) Shoulder Extension: Both;10 reps;Theraband Theraband Level (Shoulder Extension): Level 3 (Green) Shoulder ADduction: Both;10 reps;Theraband Theraband Level (Shoulder Adduction): Level 3 (Green) Seated Other Seated Lumbar Exercises: PFC 5x 10" Other Seated Lumbar Exercises: Seated posture education  Manual Therapy Manual Therapy: Other (comment) Other Manual Therapy: Muscle Energy Technique (MET) fot LT SI anterior rotation followed by gait training on TM for 5 minutes to set alignment.  Physical Therapy Assessment and Plan PT Assessment and Plan Clinical Impression Statement: Muscle energy technique for LT anterior rotation f/b gait training to set alignment.  Continued core strengthening and progressed to postural education  and postural strengthening exercises.  Pt with pain resolved and improved gait mechanics following manual.  Pt with good form and technique following demonstration with new exercises.  Pt able to achieve .38mile in 15 minutes on TM today with HR 120 following.   PT Plan: MET for SI correction, core stabilization exercises (standing, supine, s/l and prone). Progress activities to improve flexibility.    Goals Home Exercise Program Pt/caregiver will Perform Home Exercise Program: Independently PT Short Term Goals Time to Complete Short Term Goals: 2 weeks PT Short Term Goal 1: Pt will present with normalized SI alignment to report pain less than 3/10 at night time.  PT Short Term Goal 1 - Progress: Progressing toward goal PT Short Term Goal 2: Pt will require at most min cueing for core muscle activitiation to progress towards greater ease with seated and standing activities to improve respiratory funciton.  PT Short Term Goal 2 - Progress: Progressing toward goal PT Short Term Goal 3: Pt will be educated on SI self correction for home for a maintence program.  PT Long Term Goals Time to Complete Long Term Goals: 4 weeks PT Long Term Goal 1: Pt will improve core strength to [facility] in order to tolerate walking 2 miles with pain less than a 3/10 for improved QOL.  PT Long Term Goal 1 - Progress: Progressing toward goal PT Long Term Goal 2: Pt will improve FOTO to status greater than 56% and limiation less than 44% for improved percieved functional ability.   Problem List Patient Active Problem List   Diagnosis Date Noted  . Chronic low back pain 06/16/2013  . Arthritis pain of hand 06/10/2013  . Back pain 06/05/2013  . Abnormal nuclear stress test 03/28/2013  . Muscle weakness (generalized) 05/20/2012  . Partial nontraumatic rupture of rotator cuff 05/15/2012  . Shoulder arthritis 05/15/2012  . Rotator cuff tear, right 04/25/2012  . Knee pain  11/29/2011  . Difficulty in walking 09/04/2011  .  Stiffness of joint, not elsewhere classified, lower leg 09/04/2011  . Rupture quadriceps tendon 08/03/2011  . Quadriceps tendon rupture 07/20/2011  . Foreign body of thumb, right 03/28/2011  . OA (osteoarthritis) of knee 03/15/2011  . Chronic pain 03/15/2011  . CTS (carpal tunnel syndrome) 03/15/2011  . KNEE, ARTHRITIS, DEGEN./OSTEO 09/09/2009  . DERANGEMENT MENISCUS 08/19/2009  . SHOULDER, ARTHRITIS, DEGEN./OSTEO 04/05/2009  . RUPTURE ROTATOR CUFF 12/28/2008  . BACK PAIN 10/21/2008  . DIABETES MELLITUS-TYPE II 09/29/2008  . HYPERLIPIDEMIA 09/29/2008  . HYPERTENSION 09/29/2008  . GERD 09/29/2008  . HEMATOCHEZIA 09/29/2008  . TENDINITIS, CALCIFIC, SHOULDER, LEFT 08/06/2008  . SHOULDER PAIN 06/29/2008  . IMPINGEMENT SYNDROME 06/29/2008    PT - End of Session Activity Tolerance: Patient tolerated treatment well General Behavior During Therapy: [facility] for tasks assessed/performed  GP    Aldona Lento 06/23/2013, 10:25 AM

## 2013-06-25 ENCOUNTER — Ambulatory Visit (HOSPITAL_COMMUNITY)
Admission: RE | Admit: 2013-06-25 | Discharge: 2013-06-25 | Disposition: A | Discharge status: Home / Self Care | Payer: Medicare Other | Source: Ambulatory Visit | Attending: Internal Medicine | Admitting: Internal Medicine

## 2013-06-25 NOTE — Progress Notes (Signed)
Physical Therapy Treatment Patient Details  Name: [name] [name] MRN: [id number] Date of Birth: 05-27-1951  Today's Date: 06/25/2013 Time: 1019-1103 PT Time Calculation (min): 44 min TE 36  Visit#: 5 of 10  Re-eval:      Authorization:    Authorization Time Period:    Authorization Visit#: 5 of 10   Subjective: Symptoms/Limitations Symptoms: Patient has no complaints of pain today Pain Assessment Currently in Pain?: No/denies  Precautions/Restrictions     Exercise/Treatments  Active Hamstring Stretch: 3 reps;30 seconds Lower Trunk Rotation: 3 reps;30 seconds Aerobic Tread Mill:  2.5 > 3.0 mph 5% inclide.  .68 miles at [employer] for Strengthening   Standing Functional Squats: 15 reps;5 seconds;Limitations (using soccer ball with oblique twists ("shoveling")) Forward Lunge: 15 reps;5 seconds;Limitations Forward Lunge Limitations: Bil LE Side Lunge Limitations: BLE Scapular Retraction: Both;10 reps;Theraband Theraband Level (Scapular Retraction): Level 3 (Green) Row: 10 reps;Theraband Theraband Level (Row): Level 3 (Green) Shoulder Extension: Both;10 reps;Theraband Theraband Level (Shoulder Extension): Level 3 (Green) Shoulder ADduction: Both;10 reps;Theraband Theraband Level (Shoulder Adduction): Level 3 (Green) Seated   Supine Isometric Hip Flexion: 10 reps;5 seconds (double) Sidelying   Prone  Opposite Arm/Leg Raise: 10 reps;5 seconds (BLE) Other Prone Lumbar Exercises: heel squeeze 5" x5 Quadruped       Physical Therapy Assessment and Plan PT Assessment and Plan Clinical Impression Statement: Patient had no complaints today of back pain intially or at any point during session; Patient was able to improve distance and speed on TM today with HR 120 remaining  as last session.Patient performs TE with good  body mechanics requiring minimal verbal cueing for corrections.  PT Plan: MET for SI correction, core stabilization exercises (standing, supine,  s/l and prone). Progress activities to improve flexibility:Continue with PT POC    Goals    Problem List Patient Active Problem List   Diagnosis Date Noted  . Chronic low back pain 06/16/2013  . Arthritis pain of hand 06/10/2013  . Back pain 06/05/2013  . Abnormal nuclear stress test 03/28/2013  . Muscle weakness (generalized) 05/20/2012  . Partial nontraumatic rupture of rotator cuff 05/15/2012  . Shoulder arthritis 05/15/2012  . Rotator cuff tear, right 04/25/2012  . Knee pain 11/29/2011  . Difficulty in walking 09/04/2011  . Stiffness of joint, not elsewhere classified, lower leg 09/04/2011  . Rupture quadriceps tendon 08/03/2011  . Quadriceps tendon rupture 07/20/2011  . Foreign body of thumb, right 03/28/2011  . OA (osteoarthritis) of knee 03/15/2011  . Chronic pain 03/15/2011  . CTS (carpal tunnel syndrome) 03/15/2011  . KNEE, ARTHRITIS, DEGEN./OSTEO 09/09/2009  . DERANGEMENT MENISCUS 08/19/2009  . SHOULDER, ARTHRITIS, DEGEN./OSTEO 04/05/2009  . RUPTURE ROTATOR CUFF 12/28/2008  . BACK PAIN 10/21/2008  . DIABETES MELLITUS-TYPE II 09/29/2008  . HYPERLIPIDEMIA 09/29/2008  . HYPERTENSION 09/29/2008  . GERD 09/29/2008  . HEMATOCHEZIA 09/29/2008  . TENDINITIS, CALCIFIC, SHOULDER, LEFT 08/06/2008  . SHOULDER PAIN 06/29/2008  . IMPINGEMENT SYNDROME 06/29/2008    PT Plan of Care PT Patient Instructions: SI dysfunction, importance of core stability.   GP    Jaydalyn Demattia, Harper 06/25/2013, 12:51 PM

## 2013-06-27 ENCOUNTER — Ambulatory Visit (HOSPITAL_COMMUNITY)
Admission: RE | Admit: 2013-06-27 | Discharge: 2013-06-27 | Disposition: A | Discharge status: Home / Self Care | Payer: Medicare Other | Source: Ambulatory Visit | Attending: Internal Medicine | Admitting: Internal Medicine

## 2013-06-27 DIAGNOSIS — M545 Other chronic pain: Secondary | ICD-10-CM

## 2013-06-27 DIAGNOSIS — G8929 Other chronic pain: Secondary | ICD-10-CM

## 2013-06-27 NOTE — Progress Notes (Signed)
Physical Therapy Discharge Summary/Treatment  Patient Details  Name: [name] [name] MRN: [id number] Date of Birth: [birth year]  Today's Date: 06/27/2013 Time: 1012-1055 PT Time Calculation (min): 43 min Charge:Self care 1012-1020, TE 1020-1050, ROM Measurement x1              Visit#: 6 of 10  Re-eval: 07/14/13 Assessment Diagnosis: Low back pain Next MD Visit: Dr. Aline Brochure - unscheduled  Authorization: [employer] medicare    Authorization Time Period:    Authorization Visit#: 6 of 10   Subjective Symptoms/Limitations Symptoms: Pt stated he is doing great, has been pain free for several days without complaints. How long can you sit comfortably?: 30-1 hour ( was 15-20 minutes) How long can you stand comfortably?: 1-2 hours (was 15-20 minutes) How long can you walk comfortably?: 1 hour, back to walking 2 miles without pain (was walking 2 miles a day with pain) Pain Assessment Currently in Pain?: No/denies  Precautions/Restrictions  Precautions Precautions: None  Sensation/Coordination/Flexibility/Functional Tests Flexibility 90/90: Negative Functional Tests Functional Tests: FOTO: 82/18 (FOTO: 43/57)  Assessment LLE Assessment LLE Assessment: Within Functional Limits Lumbar AROM Lumbar Flexion: WNL (was decreased 50%) Lumbar Extension: WNL (was decreased 25%) Lumbar - Right Side Bend: WNL (was decreased 30%) Lumbar - Left Side Bend: WNL (was decreased 30%) Lumbar - Right Rotation: WNL (was decreased 50%) Lumbar - Left Rotation: WNL (was decreased 50%) Palpation Palpation: no spasms palpated  Exercise/Treatments Mobility/Balance  Ambulation/Gait Ambulation/Gait: Yes Assistive device: None Gait Pattern: Within Functional Limits Posture/Postural Control Posture/Postural Control: Postural limitations Postural Limitations: protruding abdomen, mild slouched posture    Aerobic Tread Mill: 3.0 mph inclince 5% x 15 minutes Standing Scapular Retraction: Both;15  reps;Theraband Theraband Level (Scapular Retraction): Level 3 (Green) Row: 15 reps;Theraband Theraband Level (Row): Level 3 (Green) Shoulder Extension: 15 reps;Theraband Theraband Level (Shoulder Extension): Level 3 (Green) Shoulder ADduction: Both;15 reps;Theraband Theraband Level (Shoulder Adduction): Level 3 (Green)  Physical Therapy Assessment and Plan PT Assessment and Plan Clinical Impression Statement: Reassessment complete following discussion with pt. with current status with the following findings:.  Pt has met all STG and LTGs.  Improved core strengthening with ability to keep SI within alignment.  Pt has been instructured techniques to complete muscle energy techniques at home and able to demonstrate appropriate technique.  Lumbar ROM WNL.  Improved activity tolerance without pain and appropriate gait mechanics.  Pt does continues to present with mild slouch posture, pt given postural strengthening HEP and educated on imprortance of good posture.   PT Plan: D/C to HEP per all goals met.    Goals Home Exercise Program Pt/caregiver will Perform Home Exercise Program: Independently PT Short Term Goals Time to Complete Short Term Goals: 2 weeks PT Short Term Goal 1: Pt will present with normalized SI alignment to report pain less than 3/10 at night time.  Progress: Met PT Short Term Goal 2: Pt will require at most min cueing for core muscle activitiation to progress towards greater ease with seated and standing activities to improve respiratory funciton.  Progress: Met PT Short Term Goal 3: Pt will be educated on SI self correction for home for a maintence program.  Progress: Met PT Long Term Goals Time to Complete Long Term Goals: 4 weeks PT Long Term Goal 1: Pt will improve core strength to [facility] in order to tolerate walking 2 miles with pain less than a 3/10 for improved QOL.  Progress: Met PT Long Term Goal 2: Pt will improve FOTO to status greater than 56%  and limiation less than  44% for improved percieved functional ability. Progress: Met (Status 82%, Limitations 18%)  Problem List Patient Active Problem List   Diagnosis Date Noted  . Chronic low back pain 06/16/2013  . Arthritis pain of hand 06/10/2013  . Back pain 06/05/2013  . Abnormal nuclear stress test 03/28/2013  . Muscle weakness (generalized) 05/20/2012  . Partial nontraumatic rupture of rotator cuff 05/15/2012  . Shoulder arthritis 05/15/2012  . Rotator cuff tear, right 04/25/2012  . Knee pain 11/29/2011  . Difficulty in walking 09/04/2011  . Stiffness of joint, not elsewhere classified, lower leg 09/04/2011  . Rupture quadriceps tendon 08/03/2011  . Quadriceps tendon rupture 07/20/2011  . Foreign body of thumb, right 03/28/2011  . OA (osteoarthritis) of knee 03/15/2011  . Chronic pain 03/15/2011  . CTS (carpal tunnel syndrome) 03/15/2011  . KNEE, ARTHRITIS, DEGEN./OSTEO 09/09/2009  . DERANGEMENT MENISCUS 08/19/2009  . SHOULDER, ARTHRITIS, DEGEN./OSTEO 04/05/2009  . RUPTURE ROTATOR CUFF 12/28/2008  . BACK PAIN 10/21/2008  . DIABETES MELLITUS-TYPE II 09/29/2008  . HYPERLIPIDEMIA 09/29/2008  . HYPERTENSION 09/29/2008  . GERD 09/29/2008  . HEMATOCHEZIA 09/29/2008  . TENDINITIS, CALCIFIC, SHOULDER, LEFT 08/06/2008  . SHOULDER PAIN 06/29/2008  . IMPINGEMENT SYNDROME 06/29/2008    PT - End of Session Activity Tolerance: Patient tolerated treatment well General Behavior During Therapy: [facility] for tasks assessed/performed  GP Functional Assessment Tool Used: FOTO 82/18 Functional Limitation: Other PT primary Other PT Primary Goal Status [id number]): At least 40 percent but less than 60 percent impaired, limited or restricted Other PT Primary Discharge Status [phone]): At least 1 percent but less than 20 percent impaired, limited or restricted  Aldona Lento; Geoffery Lyons, MPT, ATC 06/27/2013, 11:03 AM  Physician Documentation Your signature is required to indicate approval of the treatment  plan as stated above.  Please sign and either send electronically or make a copy of this report for your files and return this physician signed original.   Please mark one 1.__approve of plan  2. ___approve of plan with the following conditions.   ______________________________                                                          _____________________ Physician Signature                                                                                                             Date

## 2013-07-01 ENCOUNTER — Ambulatory Visit (HOSPITAL_COMMUNITY): Payer: Medicare Other

## 2013-07-03 ENCOUNTER — Ambulatory Visit (HOSPITAL_COMMUNITY): Payer: Medicare Other

## 2013-07-07 ENCOUNTER — Ambulatory Visit (HOSPITAL_COMMUNITY): Payer: Medicare Other

## 2013-07-09 ENCOUNTER — Ambulatory Visit (HOSPITAL_COMMUNITY): Payer: Medicare Other | Admitting: Physical Therapy

## 2013-07-14 NOTE — Telephone Encounter (Signed)
Patient has appointment for Intake class 08/29/2013

## 2013-07-21 ENCOUNTER — Other Ambulatory Visit: Payer: Self-pay | Admitting: Orthopedic Surgery

## 2013-07-21 ENCOUNTER — Telehealth: Payer: Self-pay | Admitting: Orthopedic Surgery

## 2013-07-21 DIAGNOSIS — M19019 Primary osteoarthritis, unspecified shoulder: Secondary | ICD-10-CM

## 2013-07-21 MED ORDER — HYDROCODONE-ACETAMINOPHEN 7.5-325 MG PO TABS: 1.0000 | ORAL_TABLET | Freq: Four times a day (QID) | ORAL | Status: DC | PRN

## 2013-07-21 NOTE — Telephone Encounter (Signed)
Routing to Dr Harrison 

## 2013-07-21 NOTE — Telephone Encounter (Signed)
[name] [name] wants a prescription for Hydrocodone.  You referred him to Dr. [name] for pain management, but his appointment is not until 08/29/13. Please advise

## 2013-07-22 NOTE — Telephone Encounter (Signed)
Prescription picked up by the patient

## 2013-12-09 ENCOUNTER — Other Ambulatory Visit (HOSPITAL_COMMUNITY): Payer: Self-pay | Admitting: Nephrology

## 2013-12-09 DIAGNOSIS — N289 Disorder of kidney and ureter, unspecified: Secondary | ICD-10-CM

## 2013-12-11 ENCOUNTER — Ambulatory Visit (HOSPITAL_COMMUNITY)
Admission: RE | Admit: 2013-12-11 | Discharge: 2013-12-11 | Disposition: A | Discharge status: Home / Self Care | Payer: Medicare Other | Source: Ambulatory Visit | Attending: Nephrology | Admitting: Nephrology

## 2013-12-11 DIAGNOSIS — N289 Disorder of kidney and ureter, unspecified: Secondary | ICD-10-CM | POA: Insufficient documentation

## 2014-01-15 ENCOUNTER — Emergency Department (HOSPITAL_COMMUNITY): Payer: Medicare Other

## 2014-01-15 ENCOUNTER — Encounter (HOSPITAL_COMMUNITY): Payer: Self-pay | Admitting: Emergency Medicine

## 2014-01-15 ENCOUNTER — Emergency Department (HOSPITAL_COMMUNITY)
Admission: EM | Admit: 2014-01-15 | Discharge: 2014-01-16 | Disposition: A | Discharge status: Home / Self Care | Payer: Medicare Other | Attending: Emergency Medicine | Admitting: Emergency Medicine

## 2014-01-15 DIAGNOSIS — Z8669 Personal history of other diseases of the nervous system and sense organs: Secondary | ICD-10-CM | POA: Insufficient documentation

## 2014-01-15 DIAGNOSIS — E119 Type 2 diabetes mellitus without complications: Secondary | ICD-10-CM | POA: Insufficient documentation

## 2014-01-15 DIAGNOSIS — J449 Chronic obstructive pulmonary disease, unspecified: Secondary | ICD-10-CM | POA: Insufficient documentation

## 2014-01-15 DIAGNOSIS — R109 Unspecified abdominal pain: Secondary | ICD-10-CM

## 2014-01-15 DIAGNOSIS — Z79899 Other long term (current) drug therapy: Secondary | ICD-10-CM | POA: Insufficient documentation

## 2014-01-15 DIAGNOSIS — IMO0002 Reserved for concepts with insufficient information to code with codable children: Secondary | ICD-10-CM | POA: Insufficient documentation

## 2014-01-15 DIAGNOSIS — J4489 Other specified chronic obstructive pulmonary disease: Secondary | ICD-10-CM | POA: Insufficient documentation

## 2014-01-15 DIAGNOSIS — K2990 Gastroduodenitis, unspecified, without bleeding: Principal | ICD-10-CM

## 2014-01-15 DIAGNOSIS — E785 Hyperlipidemia, unspecified: Secondary | ICD-10-CM | POA: Insufficient documentation

## 2014-01-15 DIAGNOSIS — Z8711 Personal history of peptic ulcer disease: Secondary | ICD-10-CM | POA: Insufficient documentation

## 2014-01-15 DIAGNOSIS — Z7902 Long term (current) use of antithrombotics/antiplatelets: Secondary | ICD-10-CM | POA: Insufficient documentation

## 2014-01-15 DIAGNOSIS — K2970 Gastritis, unspecified, without bleeding: Secondary | ICD-10-CM

## 2014-01-15 DIAGNOSIS — Z8659 Personal history of other mental and behavioral disorders: Secondary | ICD-10-CM | POA: Insufficient documentation

## 2014-01-15 DIAGNOSIS — I10 Essential (primary) hypertension: Secondary | ICD-10-CM | POA: Insufficient documentation

## 2014-01-15 DIAGNOSIS — K219 Gastro-esophageal reflux disease without esophagitis: Secondary | ICD-10-CM | POA: Insufficient documentation

## 2014-01-15 DIAGNOSIS — Z794 Long term (current) use of insulin: Secondary | ICD-10-CM | POA: Insufficient documentation

## 2014-01-15 DIAGNOSIS — Z87891 Personal history of nicotine dependence: Secondary | ICD-10-CM | POA: Insufficient documentation

## 2014-01-15 LAB — COMPREHENSIVE METABOLIC PANEL
ALK PHOS: 64 U/L (ref 39–117)
ALT: 19 U/L (ref 0–53)
AST: 19 U/L (ref 0–37)
Albumin: 3.4 g/dL — ABNORMAL LOW (ref 3.5–5.2)
Anion gap: 11 (ref 5–15)
BUN: 25 mg/dL — ABNORMAL HIGH (ref 6–23)
CO2: 27 meq/L (ref 19–32)
Calcium: 8.7 mg/dL (ref 8.4–10.5)
Chloride: 105 mEq/L (ref 96–112)
Creatinine, Ser: 2.39 mg/dL — ABNORMAL HIGH (ref 0.50–1.35)
GFR calc non Af Amer: 27 mL/min — ABNORMAL LOW (ref >90)
GFR, EST AFRICAN AMERICAN: 32 mL/min — AB (ref >90)
GLUCOSE: 144 mg/dL — AB (ref 70–99)
POTASSIUM: 4 meq/L (ref 3.7–5.3)
SODIUM: 143 meq/L (ref 137–147)
TOTAL PROTEIN: 7 g/dL (ref 6.0–8.3)
Total Bilirubin: <0.2 mg/dL — ABNORMAL LOW (ref 0.3–1.2)

## 2014-01-15 LAB — CBC WITH DIFFERENTIAL/PLATELET
BASOS ABS: 0 10*3/uL (ref 0.0–0.1)
Basophils Relative: 1 % (ref 0–1)
EOS ABS: 0.1 10*3/uL (ref 0.0–0.7)
EOS PCT: 2 % (ref 0–5)
HCT: 37.7 % — ABNORMAL LOW (ref 39.0–52.0)
Hemoglobin: 12.3 g/dL — ABNORMAL LOW (ref 13.0–17.0)
LYMPHS ABS: 1.1 10*3/uL (ref 0.7–4.0)
LYMPHS PCT: 29 % (ref 12–46)
MCH: 26.2 pg (ref 26.0–34.0)
MCHC: 32.6 g/dL (ref 30.0–36.0)
MCV: 80.4 fL (ref 78.0–100.0)
Monocytes Absolute: 0.4 10*3/uL (ref 0.1–1.0)
Monocytes Relative: 9 % (ref 3–12)
NEUTROS PCT: 59 % (ref 43–77)
Neutro Abs: 2.2 10*3/uL (ref 1.7–7.7)
PLATELETS: 155 10*3/uL (ref 150–400)
RBC: 4.69 MIL/uL (ref 4.22–5.81)
RDW: 15.7 % — AB (ref 11.5–15.5)
WBC: 3.8 10*3/uL — AB (ref 4.0–10.5)

## 2014-01-15 LAB — URINALYSIS, ROUTINE W REFLEX MICROSCOPIC
Bilirubin Urine: NEGATIVE
GLUCOSE, UA: NEGATIVE mg/dL
Ketones, ur: NEGATIVE mg/dL
Leukocytes, UA: NEGATIVE
Nitrite: NEGATIVE
Protein, ur: 30 mg/dL — AB
Specific Gravity, Urine: 1.01 (ref 1.005–1.030)
Urobilinogen, UA: 0.2 mg/dL (ref 0.0–1.0)
pH: 5.5 (ref 5.0–8.0)

## 2014-01-15 LAB — URINE MICROSCOPIC-ADD ON

## 2014-01-15 LAB — LIPASE, BLOOD: Lipase: 109 U/L — ABNORMAL HIGH (ref 11–59)

## 2014-01-15 MED ORDER — ONDANSETRON HCL 4 MG/2ML IJ SOLN
4.0000 mg | Freq: Once | INTRAMUSCULAR | Status: AC
  Administered 2014-01-15: 4 mg via INTRAVENOUS
  Filled 2014-01-15: qty 2

## 2014-01-15 MED ORDER — SODIUM CHLORIDE 0.9 % IV BOLUS (SEPSIS)
500.0000 mL | Freq: Once | INTRAVENOUS | Status: AC
  Administered 2014-01-15: 23:00:00 via INTRAVENOUS

## 2014-01-15 MED ORDER — SODIUM CHLORIDE 0.9 % IV SOLN
Freq: Once | INTRAVENOUS | Status: AC
  Administered 2014-01-15: 23:00:00 via INTRAVENOUS

## 2014-01-15 MED ORDER — MORPHINE SULFATE 4 MG/ML IJ SOLN
4.0000 mg | INTRAMUSCULAR | Status: DC | PRN
  Administered 2014-01-15 – 2014-01-16 (×2): 4 mg via INTRAVENOUS
  Filled 2014-01-15 (×2): qty 1

## 2014-01-15 NOTE — ED Provider Notes (Signed)
CSN: [id number]     Arrival date & time 01/15/14  2135 History  This chart was scribed for [name], MD by Lowella Petties, ED Scribe. The patient was seen in room APA15/APA15. Patient's care was started at 10:16 PM.     Chief Complaint  Patient presents with  . Abdominal Pain    The history is provided by the patient. No language interpreter was used.   HPI Comments: [name] [name] is a 63 y.o. male who presents to the Emergency Department complaining of waxing and waining left sided abdominal pain onset two weeks ago. He reports that the pain radiates into his back, and he rates it as an 8/10. He reports that eating exacerbate the pain. He denies history of kidney stones or infections. He reports taking BP medication, Insulin for DM. He denies fever, dysuria, hematuria, nausea, and vomiting. He denies history of diverticulitis. He reports a normal colonoscopy. He reports drinking alcohol on occasion. He reports allergie to Tramadol and Opana.   Past Medical History  Diagnosis Date  . COPD (chronic obstructive pulmonary disease)   . Sinusitis   . PUD (peptic ulcer disease)     remote, reports f/u EGD about 8 years ago unremarkable   . Diabetes   . GERD (gastroesophageal reflux disease)   . Hyperlipidemia   . Hypertension   . Sleep apnea   . Post traumatic stress disorder   . Anxiety   . Depression   . Bell palsy    Past Surgical History  Procedure Laterality Date  . Asad lt shoulder  12/2008    left shoulder  . Umbilical hernia repair  2007    [place]  . Knee arthroscopy  10/2007    left  . Toenail excision      removed x2-bilateral  . Eye surgery  12/22/2010    tear duct probing-[place]  . Foreign body removal  03/29/2011    Procedure: REMOVAL FOREIGN BODY EXTREMITY;  Surgeon: Arther Abbott, MD;  Location: [facility];  Service: Orthopedics;  Laterality: Right;  Removal Foreign Body Right Thumb  . Quadriceps tendon repair  07/21/2011    Procedure: REPAIR QUADRICEP TENDON;   Surgeon: Arther Abbott, MD;  Location: [facility];  Service: Orthopedics;  Laterality: Right;   Family History  Problem Relation Age of Onset  . Hypertension Mother     MI  . Cancer Mother     breast   . Diabetes Mother   . Diabetes Father   . Hypertension Father   . Hypertension Sister   . Diabetes Sister   . Arthritis    . Asthma    . Lung disease    . Anesthesia problems Neg Hx   . Hypotension Neg Hx   . Malignant hyperthermia Neg Hx   . Pseudochol deficiency Neg Hx    History  Substance Use Topics  . Smoking status: Former Smoker -- 1.00 packs/day for 25 years    Types: Cigarettes    Quit date: 03/27/2010  . Smokeless tobacco: Not on file  . Alcohol Use: No     Comment: stopped feb 2011    Review of Systems  Constitutional: Negative for fever, diaphoresis and appetite change.  HENT: Negative for mouth sores and trouble swallowing.   Eyes: Negative for visual disturbance.  Respiratory: Negative for chest tightness and wheezing.   Gastrointestinal: Positive for abdominal pain (left). Negative for nausea, vomiting and abdominal distention.  Endocrine: Negative for polydipsia, polyphagia and polyuria.  Genitourinary: Negative  for dysuria, frequency, hematuria and difficulty urinating.  Musculoskeletal: Negative for gait problem.  Skin: Negative for color change, pallor and rash.  Neurological: Negative for dizziness, syncope, light-headedness and headaches.  Hematological: Does not bruise/bleed easily.  Psychiatric/Behavioral: Negative for behavioral problems and confusion.      Allergies  Opana and Tramadol  Home Medications   Prior to Admission medications   Medication Sig Start Date End Date Taking? Authorizing Provider  albuterol (PROVENTIL HFA) 108 (90 BASE) MCG/ACT inhaler Inhale 2 puffs into the lungs every 4 (four) hours as needed for wheezing.    Yes Historical Provider, MD  amLODipine (NORVASC) 10 MG tablet Take 10 mg by mouth daily.    Yes Historical  Provider, MD  budesonide-formoterol (SYMBICORT) 160-4.5 MCG/ACT inhaler Inhale 2 puffs into the lungs daily as needed (Shortness of Breath).    Yes Historical Provider, MD  clopidogrel (PLAVIX) 75 MG tablet Take 1 tablet (75 mg total) by mouth daily. 03/28/13  Yes Leonie Man, MD  insulin glargine (LANTUS) 100 UNIT/ML injection Inject 50 Units into the skin at bedtime.   Yes Historical Provider, MD  loratadine (CLARITIN) 10 MG tablet Take 10 mg by mouth as needed for allergies.    Yes Historical Provider, MD  mometasone (NASONEX) 50 MCG/ACT nasal spray Place 2 sprays into the nose daily.    Yes Historical Provider, MD  omeprazole (PRILOSEC) 20 MG capsule Take 20 mg by mouth daily.    Yes Historical Provider, MD  simvastatin (ZOCOR) 20 MG tablet Take 20 mg by mouth every morning.    Yes Historical Provider, MD  valsartan (DIOVAN) 40 MG tablet Take 1 tablet (40 mg total) by mouth daily. 03/25/13  Yes Rebecca Eaton, MD  vitamin B-12 (CYANOCOBALAMIN) 1000 MCG tablet Take 1,000 mcg by mouth daily.   Yes Historical Provider, MD  HYDROcodone-acetaminophen (NORCO/VICODIN) 5-325 MG per tablet Take 1 tablet by mouth every 4 (four) hours as needed. 01/16/14   [name], MD  omeprazole (PRILOSEC) 20 MG capsule Take 1 capsule (20 mg total) by mouth 2 (two) times daily. 01/16/14   [name], MD  ondansetron (ZOFRAN ODT) 4 MG disintegrating tablet Take 1 tablet (4 mg total) by mouth every 8 (eight) hours as needed for nausea. 01/16/14   [name], MD   Triage Vitals: BP 147/89  Pulse 70  Temp(Src) 97.9 F (36.6 C) (Oral)  Resp 20  Ht 5\' 4"  (1.626 m)  Wt 234 lb (106.142 kg)  BMI 40.15 kg/m2  SpO2 95% Physical Exam  Constitutional: He is oriented to person, place, and time. He appears well-developed and well-nourished. No distress.  HENT:  Head: Normocephalic.  Eyes: Conjunctivae are normal. Pupils are equal, round, and reactive to light. No scleral icterus.  Neck: Normal range of motion. Neck supple.  No thyromegaly present.  Cardiovascular: Normal rate and regular rhythm.  Exam reveals no gallop and no friction rub.   No murmur heard. Pulmonary/Chest: Effort normal and breath sounds normal. No respiratory distress. He has no wheezes. He has no rales.  Abdominal: Soft. Bowel sounds are normal. He exhibits no distension. There is tenderness (Left and Mid). There is no rebound.  Musculoskeletal: Normal range of motion.  Neurological: He is alert and oriented to person, place, and time.  Skin: Skin is warm and dry. No rash noted.  Psychiatric: He has a normal mood and affect. His behavior is normal.    ED Course  Procedures (including critical care time) DIAGNOSTIC STUDIES: Oxygen Saturation  is 95% on room air, normal by my interpretation.    COORDINATION OF CARE: 10:23 PM-Discussed treatment plan which includes Cat Scan and blood work  with pt at bedside and pt agreed to plan.   Labs Review Labs Reviewed  CBC WITH DIFFERENTIAL - Abnormal; Notable for the following:    WBC 3.8 (*)    Hemoglobin 12.3 (*)    HCT 37.7 (*)    RDW 15.7 (*)    All other components within normal limits  COMPREHENSIVE METABOLIC PANEL - Abnormal; Notable for the following:    Glucose, Bld 144 (*)    BUN 25 (*)    Creatinine, Ser 2.39 (*)    Albumin 3.4 (*)    Total Bilirubin <0.2 (*)    GFR calc non Af Amer 27 (*)    GFR calc Af Amer 32 (*)    All other components within normal limits  LIPASE, BLOOD - Abnormal; Notable for the following:    Lipase 109 (*)    All other components within normal limits  URINALYSIS, ROUTINE W REFLEX MICROSCOPIC - Abnormal; Notable for the following:    Hgb urine dipstick TRACE (*)    Protein, ur 30 (*)    All other components within normal limits  URINE MICROSCOPIC-ADD ON    Imaging Review Ct Abdomen Pelvis Wo Contrast  01/15/2014   CLINICAL DATA:  Left flank pain  EXAM: CT ABDOMEN AND PELVIS WITHOUT CONTRAST  TECHNIQUE: Multidetector CT imaging of the abdomen and  pelvis was performed following the standard protocol without IV contrast.  COMPARISON:  None.  FINDINGS: BODY WALL: Unremarkable.  LOWER CHEST: Unremarkable.  ABDOMEN/PELVIS:  Liver: Small (subcentimeter) scattered low densities within the liver which favor benign entity such as cysts.  Biliary: No evidence of biliary obstruction or stone.  Pancreas: Unremarkable.  Spleen: Unremarkable.  Adrenals: Unremarkable.  Kidneys and ureters: No hydronephrosis or stone.  Bladder: Unremarkable.  Reproductive: Unremarkable for age.  Bowel: No obstruction. No bowel wall thickening. Focal dystrophic calcification along the anti mesenteric border of the upper sigmoid colon suggests remote epiploic appendagitis.  Retroperitoneum: No mass or adenopathy.  Peritoneum: No ascites or pneumoperitoneum.  Vascular: Extensive aortic and branch vessel atherosclerosis, with tortuosity. No aortic aneurysm.  OSSEOUS: No acute findings to explain flank pain. Degenerative overgrowth of the L4-5 and L5-S1 facets.  IMPRESSION: No acute intra-abdominal findings. No hydronephrosis or nephrolithiasis.   Electronically Signed   By: Jorje Guild M.D.   On: 01/15/2014 23:53     EKG Interpretation None      MDM   Final diagnoses:  Abdominal pain, unspecified abdominal location  Gastritis    CT negative. I discussed his azotemia with him. His most recent level at his nephrology appointment was greater than 2. Given fluids here. Plan is to stop inhibitor, symptom control. Avoid alcohol tobacco caffeine anti-inflammatories. He has a followup appointment with his physician Dr. Legrand Rams on Monday  I personally performed the services described in this documentation, which was scribed in my presence. The recorded information has been reviewed and is accurate.    [name], MD 01/16/14 [id number]

## 2014-01-15 NOTE — ED Notes (Signed)
Pain LUQ  For 2 weeks, no NVD,  Increased pain after eating.  No fever.

## 2014-01-16 MED ORDER — OMEPRAZOLE 20 MG PO CPDR: 20.0000 mg | DELAYED_RELEASE_CAPSULE | Freq: Two times a day (BID) | ORAL | Status: DC

## 2014-01-16 MED ORDER — SODIUM CHLORIDE 0.9 % IV BOLUS (SEPSIS)
1000.0000 mL | Freq: Once | INTRAVENOUS | Status: AC
  Administered 2014-01-16: 1000 mL via INTRAVENOUS

## 2014-01-16 MED ORDER — HYDROCODONE-ACETAMINOPHEN 5-325 MG PO TABS: 1.0000 | ORAL_TABLET | ORAL | Status: DC | PRN

## 2014-01-16 MED ORDER — ONDANSETRON 4 MG PO TBDP: 4.0000 mg | ORAL_TABLET | Freq: Three times a day (TID) | ORAL | Status: DC | PRN

## 2014-01-16 NOTE — Discharge Instructions (Signed)
Abdominal Pain °Many things can cause abdominal pain. Usually, abdominal pain is not caused by a disease and will improve without treatment. It can often be observed and treated at home. Your health care provider will do a physical exam and possibly order blood tests and X-rays to help determine the seriousness of your pain. However, in many cases, more time must pass before a clear cause of the pain can be found. Before that point, your health care provider may not know if you need more testing or further treatment. °HOME CARE INSTRUCTIONS  °Monitor your abdominal pain for any changes. The following actions may help to alleviate any discomfort you are experiencing: °· Only take over-the-counter or prescription medicines as directed by your health care provider. °· Do not take laxatives unless directed to do so by your health care provider. °· Try a clear liquid diet (broth, tea, or water) as directed by your health care provider. Slowly move to a bland diet as tolerated. °SEEK MEDICAL CARE IF: °· You have unexplained abdominal pain. °· You have abdominal pain associated with nausea or diarrhea. °· You have pain when you urinate or have a bowel movement. °· You experience abdominal pain that wakes you in the night. °· You have abdominal pain that is worsened or improved by eating food. °· You have abdominal pain that is worsened with eating fatty foods. °· You have a fever. °SEEK IMMEDIATE MEDICAL CARE IF:  °· Your pain does not go away within 2 hours. °· You keep throwing up (vomiting). °· Your pain is felt only in portions of the abdomen, such as the right side or the left lower portion of the abdomen. °· You pass bloody or black tarry stools. °MAKE SURE YOU: °· Understand these instructions.   °· Will watch your condition.   °· Will get help right away if you are not doing well or get worse.   °Document Released: 04/12/2005 Document Revised: 07/08/2013 Document Reviewed: 03/12/2013 °[employer]® Patient Information  ©2015 [employer]. This information is not intended to replace advice given to you by your health care provider. Make sure you discuss any questions you have with your health care provider. ° °Gastritis, Adult °Gastritis is soreness and swelling (inflammation) of the lining of the stomach. Gastritis can develop as a sudden onset (acute) or long-term (chronic) condition. If gastritis is not treated, it can lead to stomach bleeding and ulcers. °CAUSES  °Gastritis occurs when the stomach lining is weak or damaged. Digestive juices from the stomach then inflame the weakened stomach lining. The stomach lining may be weak or damaged due to viral or bacterial infections. One common bacterial infection is the Helicobacter pylori infection. Gastritis can also result from excessive alcohol consumption, taking certain medicines, or having too much acid in the stomach.  °SYMPTOMS  °In some cases, there are no symptoms. When symptoms are present, they may include: °· Pain or a burning sensation in the upper abdomen. °· Nausea. °· Vomiting. °· An uncomfortable feeling of fullness after eating. °DIAGNOSIS  °Your caregiver may suspect you have gastritis based on your symptoms and a physical exam. To determine the cause of your gastritis, your caregiver may perform the following: °· Blood or stool tests to check for the H pylori bacterium. °· Gastroscopy. A thin, flexible tube (endoscope) is passed down the esophagus and into the stomach. The endoscope has a light and camera on the end. Your caregiver uses the endoscope to view the inside of the stomach. °· Taking a tissue sample (biopsy)   from the stomach to examine under a microscope. °TREATMENT  °Depending on the cause of your gastritis, medicines may be prescribed. If you have a bacterial infection, such as an H pylori infection, antibiotics may be given. If your gastritis is caused by too much acid in the stomach, H2 blockers or antacids may be given. Your caregiver may  recommend that you stop taking aspirin, ibuprofen, or other nonsteroidal anti-inflammatory drugs (NSAIDs). °HOME CARE INSTRUCTIONS °· Only take over-the-counter or prescription medicines as directed by your caregiver. °· If you were given antibiotic medicines, take them as directed. Finish them even if you start to feel better. °· Drink enough fluids to keep your urine clear or pale yellow. °· Avoid foods and drinks that make your symptoms worse, such as: °¨ Caffeine or alcoholic drinks. °¨ Chocolate. °¨ Peppermint or mint flavorings. °¨ Garlic and onions. °¨ Spicy foods. °¨ Citrus fruits, such as oranges, lemons, or limes. °¨ Tomato-based foods such as sauce, chili, salsa, and pizza. °¨ Fried and fatty foods. °· Eat small, frequent meals instead of large meals. °SEEK IMMEDIATE MEDICAL CARE IF:  °· You have black or dark red stools. °· You vomit blood or material that looks like coffee grounds. °· You are unable to keep fluids down. °· Your abdominal pain gets worse. °· You have a fever. °· You do not feel better after 1 week. °· You have any other questions or concerns. °MAKE SURE YOU: °· Understand these instructions. °· Will watch your condition. °· Will get help right away if you are not doing well or get worse. °Document Released: 06/27/2001 Document Revised: 01/02/2012 Document Reviewed: 08/16/2011 °ExitCare® Patient Information ©2015 [employer]. This information is not intended to replace advice given to you by your health care provider. Make sure you discuss any questions you have with your health care provider. ° °

## 2014-02-06 ENCOUNTER — Other Ambulatory Visit (HOSPITAL_COMMUNITY): Payer: Self-pay | Admitting: Internal Medicine

## 2014-02-06 ENCOUNTER — Ambulatory Visit (HOSPITAL_COMMUNITY)
Admission: RE | Admit: 2014-02-06 | Discharge: 2014-02-06 | Disposition: A | Discharge status: Home / Self Care | Payer: Medicare Other | Source: Ambulatory Visit | Attending: Internal Medicine | Admitting: Internal Medicine

## 2014-02-06 DIAGNOSIS — M545 Low back pain, unspecified: Secondary | ICD-10-CM

## 2014-02-06 DIAGNOSIS — M549 Dorsalgia, unspecified: Secondary | ICD-10-CM | POA: Diagnosis present

## 2014-03-04 ENCOUNTER — Encounter (HOSPITAL_COMMUNITY): Payer: Self-pay | Admitting: Emergency Medicine

## 2014-03-04 ENCOUNTER — Emergency Department (HOSPITAL_COMMUNITY): Payer: Medicare Other

## 2014-03-04 ENCOUNTER — Emergency Department (HOSPITAL_COMMUNITY)
Admission: EM | Admit: 2014-03-04 | Discharge: 2014-03-04 | Disposition: A | Discharge status: Home / Self Care | Payer: Medicare Other | Attending: Emergency Medicine | Admitting: Emergency Medicine

## 2014-03-04 DIAGNOSIS — S93401A Sprain of unspecified ligament of right ankle, initial encounter: Secondary | ICD-10-CM

## 2014-03-04 DIAGNOSIS — Z8669 Personal history of other diseases of the nervous system and sense organs: Secondary | ICD-10-CM | POA: Diagnosis not present

## 2014-03-04 DIAGNOSIS — K219 Gastro-esophageal reflux disease without esophagitis: Secondary | ICD-10-CM | POA: Diagnosis not present

## 2014-03-04 DIAGNOSIS — F4310 Post-traumatic stress disorder, unspecified: Secondary | ICD-10-CM | POA: Diagnosis not present

## 2014-03-04 DIAGNOSIS — S93409A Sprain of unspecified ligament of unspecified ankle, initial encounter: Secondary | ICD-10-CM | POA: Diagnosis not present

## 2014-03-04 DIAGNOSIS — J449 Chronic obstructive pulmonary disease, unspecified: Secondary | ICD-10-CM | POA: Insufficient documentation

## 2014-03-04 DIAGNOSIS — Z79899 Other long term (current) drug therapy: Secondary | ICD-10-CM | POA: Diagnosis not present

## 2014-03-04 DIAGNOSIS — Z794 Long term (current) use of insulin: Secondary | ICD-10-CM | POA: Insufficient documentation

## 2014-03-04 DIAGNOSIS — Z7902 Long term (current) use of antithrombotics/antiplatelets: Secondary | ICD-10-CM | POA: Diagnosis not present

## 2014-03-04 DIAGNOSIS — E785 Hyperlipidemia, unspecified: Secondary | ICD-10-CM | POA: Insufficient documentation

## 2014-03-04 DIAGNOSIS — S99919A Unspecified injury of unspecified ankle, initial encounter: Secondary | ICD-10-CM

## 2014-03-04 DIAGNOSIS — IMO0002 Reserved for concepts with insufficient information to code with codable children: Secondary | ICD-10-CM | POA: Diagnosis not present

## 2014-03-04 DIAGNOSIS — J4489 Other specified chronic obstructive pulmonary disease: Secondary | ICD-10-CM | POA: Insufficient documentation

## 2014-03-04 DIAGNOSIS — Y93K1 Activity, walking an animal: Secondary | ICD-10-CM | POA: Insufficient documentation

## 2014-03-04 DIAGNOSIS — F329 Major depressive disorder, single episode, unspecified: Secondary | ICD-10-CM | POA: Diagnosis not present

## 2014-03-04 DIAGNOSIS — Z8711 Personal history of peptic ulcer disease: Secondary | ICD-10-CM | POA: Insufficient documentation

## 2014-03-04 DIAGNOSIS — E119 Type 2 diabetes mellitus without complications: Secondary | ICD-10-CM | POA: Insufficient documentation

## 2014-03-04 DIAGNOSIS — X500XXA Overexertion from strenuous movement or load, initial encounter: Secondary | ICD-10-CM | POA: Diagnosis not present

## 2014-03-04 DIAGNOSIS — Z87891 Personal history of nicotine dependence: Secondary | ICD-10-CM | POA: Insufficient documentation

## 2014-03-04 DIAGNOSIS — F411 Generalized anxiety disorder: Secondary | ICD-10-CM | POA: Diagnosis not present

## 2014-03-04 DIAGNOSIS — E663 Overweight: Secondary | ICD-10-CM | POA: Diagnosis not present

## 2014-03-04 DIAGNOSIS — S99929A Unspecified injury of unspecified foot, initial encounter: Secondary | ICD-10-CM

## 2014-03-04 DIAGNOSIS — F3289 Other specified depressive episodes: Secondary | ICD-10-CM | POA: Diagnosis not present

## 2014-03-04 DIAGNOSIS — Y929 Unspecified place or not applicable: Secondary | ICD-10-CM | POA: Insufficient documentation

## 2014-03-04 DIAGNOSIS — S8990XA Unspecified injury of unspecified lower leg, initial encounter: Secondary | ICD-10-CM | POA: Insufficient documentation

## 2014-03-04 DIAGNOSIS — I10 Essential (primary) hypertension: Secondary | ICD-10-CM | POA: Diagnosis not present

## 2014-03-04 MED ORDER — HYDROCODONE-ACETAMINOPHEN 5-325 MG PO TABS: 1.0000 | ORAL_TABLET | Freq: Four times a day (QID) | ORAL | Status: DC | PRN

## 2014-03-04 MED ORDER — HYDROCODONE-ACETAMINOPHEN 5-325 MG PO TABS
1.0000 | ORAL_TABLET | Freq: Once | ORAL | Status: AC
  Administered 2014-03-04: 1 via ORAL
  Filled 2014-03-04: qty 1

## 2014-03-04 NOTE — ED Notes (Signed)
Pt states he twisted right ankle yesterday evening. States minimal pain last night but was much worse this morning.

## 2014-03-04 NOTE — ED Notes (Signed)
Pt verbalized understanding on pain med can cause drowsiness and no driving within 4 hours of taking

## 2014-03-04 NOTE — ED Provider Notes (Signed)
CSN: [id number]     Arrival date & time 03/04/14  0907 History   First MD Initiated Contact with Patient 03/04/14 0912     Chief Complaint  Patient presents with  . Ankle Injury     (Consider location/radiation/quality/duration/timing/severity/associated sxs/prior Treatment) HPI  This is a 63 year old male who presents with right ankle pain. Patient reports that he was walking his dog last night when he stepped wrong on his right foot. Since that time he has had progressive pain. Rates pain at 8/10. It is nonradiating. He has not taken anything for pain. Denies any other injury. Denies any pain to the proximal tib-fib or knee.  Past Medical History  Diagnosis Date  . COPD (chronic obstructive pulmonary disease)   . Sinusitis   . PUD (peptic ulcer disease)     remote, reports f/u EGD about 8 years ago unremarkable   . Diabetes   . GERD (gastroesophageal reflux disease)   . Hyperlipidemia   . Hypertension   . Sleep apnea   . Post traumatic stress disorder   . Anxiety   . Depression   . Bell palsy    Past Surgical History  Procedure Laterality Date  . Asad lt shoulder  12/2008    left shoulder  . Umbilical hernia repair  2007    [place]  . Knee arthroscopy  10/2007    left  . Toenail excision      removed x2-bilateral  . Eye surgery  12/22/2010    tear duct probing-[place]  . Foreign body removal  03/29/2011    Procedure: REMOVAL FOREIGN BODY EXTREMITY;  Surgeon: Arther Abbott, MD;  Location: [facility];  Service: Orthopedics;  Laterality: Right;  Removal Foreign Body Right Thumb  . Quadriceps tendon repair  07/21/2011    Procedure: REPAIR QUADRICEP TENDON;  Surgeon: Arther Abbott, MD;  Location: [facility];  Service: Orthopedics;  Laterality: Right;   Family History  Problem Relation Age of Onset  . Hypertension Mother     MI  . Cancer Mother     breast   . Diabetes Mother   . Diabetes Father   . Hypertension Father   . Hypertension Sister   . Diabetes Sister   .  Arthritis    . Asthma    . Lung disease    . Anesthesia problems Neg Hx   . Hypotension Neg Hx   . Malignant hyperthermia Neg Hx   . Pseudochol deficiency Neg Hx    History  Substance Use Topics  . Smoking status: Former Smoker -- 1.00 packs/day for 25 years    Types: Cigarettes    Quit date: 03/27/2010  . Smokeless tobacco: Not on file  . Alcohol Use: No     Comment: stopped feb 2011    Review of Systems  Musculoskeletal:       Right ankle pain  Skin: Negative for wound.  All other systems reviewed and are negative.     Allergies  Opana and Tramadol  Home Medications   Prior to Admission medications   Medication Sig Start Date End Date Taking? Authorizing Provider  albuterol (PROVENTIL HFA) 108 (90 BASE) MCG/ACT inhaler Inhale 2 puffs into the lungs every 4 (four) hours as needed for wheezing.    Yes Historical Provider, MD  amLODipine (NORVASC) 10 MG tablet Take 10 mg by mouth daily.    Yes Historical Provider, MD  budesonide-formoterol (SYMBICORT) 160-4.5 MCG/ACT inhaler Inhale 2 puffs into the lungs daily as needed (Shortness of Breath).  Yes Historical Provider, MD  clopidogrel (PLAVIX) 75 MG tablet Take 1 tablet (75 mg total) by mouth daily. 03/28/13  Yes Leonie Man, MD  insulin glargine (LANTUS) 100 UNIT/ML injection Inject 30 Units into the skin at bedtime.    Yes Historical Provider, MD  iron polysaccharides (NIFEREX) 150 MG capsule Take 150 mg by mouth daily.   Yes Historical Provider, MD  loratadine (CLARITIN) 10 MG tablet Take 10 mg by mouth as needed for allergies.    Yes Historical Provider, MD  mometasone (NASONEX) 50 MCG/ACT nasal spray Place 2 sprays into the nose daily.    Yes Historical Provider, MD  omeprazole (PRILOSEC) 20 MG capsule Take 20 mg by mouth daily.    Yes Historical Provider, MD  simvastatin (ZOCOR) 20 MG tablet Take 20 mg by mouth every morning.    Yes Historical Provider, MD  TRADJENTA 5 MG TABS tablet Take 1 tablet by mouth daily.  02/05/14  Yes Historical Provider, MD  valsartan (DIOVAN) 40 MG tablet Take 1 tablet (40 mg total) by mouth daily. 03/25/13  Yes Rebecca Eaton, MD  HYDROcodone-acetaminophen (NORCO/VICODIN) 5-325 MG per tablet Take 1 tablet by mouth every 6 (six) hours as needed for moderate pain. 03/04/14   Merryl Hacker, MD   BP 166/99  Pulse 83  Temp(Src) 98.1 F (36.7 C) (Oral)  Resp 18  Ht 5\' 4"  (1.626 m)  Wt 234 lb (106.142 kg)  BMI 40.15 kg/m2  SpO2 96% Physical Exam  Nursing note and vitals reviewed. Constitutional: He is oriented to person, place, and time. No distress.  Overweight  HENT:  Head: Normocephalic and atraumatic.  Cardiovascular: Normal rate and regular rhythm.   Pulmonary/Chest: Effort normal. No respiratory distress.  Musculoskeletal: He exhibits no edema.  Tenderness to palpation over the medial malleolus of the right ankle, no obvious swelling or deformity, no midfoot pain, limited range of motion secondary to pain, no proximal fibular tenderness, 2+ DP pulse  Neurological: He is alert and oriented to person, place, and time.  Skin: Skin is warm and dry.  Psychiatric: He has a normal mood and affect.    ED Course  Procedures (including critical care time) Labs Review Labs Reviewed - No data to display  Imaging Review Dg Ankle Complete Right  03/04/2014   CLINICAL DATA:  Pain.  EXAM: RIGHT ANKLE - COMPLETE 3+ VIEW  COMPARISON:  None.  FINDINGS: No acute bony or joint abnormality identified. No evidence of fracture. Rounded corticated density noted adjacent to the medial malleolus most likely an old fracture.  IMPRESSION: No acute abnormality. Small rounded corticated density noted adjacent to the medial malleolus most likely site of old fracture.   Electronically Signed   By: Marcello Moores  Register   On: 03/04/2014 10:22     EKG Interpretation None      MDM   Final diagnoses:  Right ankle sprain, initial encounter    [place] presents with right ankle pain after  walking his dog last night. Tenderness to palpation over the medial malleolus. No other obvious signs of trauma. Plain films are negative. Patient was given Norco for pain with some improvement. Discussed the patient rest, ice, compression, and elevation.  Will send him with a short course of pain medication.  After history, exam, and medical workup I feel the patient has been appropriately medically screened and is safe for discharge home. Pertinent diagnoses were discussed with the patient. Patient was given return precautions.     Merryl Hacker,  MD 03/04/14 1554

## 2014-03-04 NOTE — Discharge Instructions (Signed)
°Ankle Sprain °An ankle sprain is an injury to the strong, fibrous tissues (ligaments) that hold the bones of your ankle joint together.  °CAUSES °An ankle sprain is usually caused by a fall or by twisting your ankle. Ankle sprains most commonly occur when you step on the outer edge of your foot, and your ankle turns inward. People who participate in sports are more prone to these types of injuries.  °SYMPTOMS  °· Pain in your ankle. The pain may be present at rest or only when you are trying to stand or walk. °· Swelling. °· Bruising. Bruising may develop immediately or within 1 to 2 days after your injury. °· Difficulty standing or walking, particularly when turning corners or changing directions. °DIAGNOSIS  °Your caregiver will ask you details about your injury and perform a physical exam of your ankle to determine if you have an ankle sprain. During the physical exam, your caregiver will press on and apply pressure to specific areas of your foot and ankle. Your caregiver will try to move your ankle in certain ways. An X-ray exam may be done to be sure a bone was not broken or a ligament did not separate from one of the bones in your ankle (avulsion fracture).  °TREATMENT  °Certain types of braces can help stabilize your ankle. Your caregiver can make a recommendation for this. Your caregiver may recommend the use of medicine for pain. If your sprain is severe, your caregiver may refer you to a surgeon who helps to restore function to parts of your skeletal system (orthopedist) or a physical therapist. °HOME CARE INSTRUCTIONS  °· Apply ice to your injury for 1-2 days or as directed by your caregiver. Applying ice helps to reduce inflammation and pain. °¨ Put ice in a plastic bag. °¨ Place a towel between your skin and the bag. °¨ Leave the ice on for 15-20 minutes at a time, every 2 hours while you are awake. °· Only take over-the-counter or prescription medicines for pain, discomfort, or fever as directed by  your caregiver. °· Elevate your injured ankle above the level of your heart as much as possible for 2-3 days. °· If your caregiver recommends crutches, use them as instructed. Gradually put weight on the affected ankle. Continue to use crutches or a cane until you can walk without feeling pain in your ankle. °· If you have a plaster splint, wear the splint as directed by your caregiver. Do not rest it on anything harder than a pillow for the first 24 hours. Do not put weight on it. Do not get it wet. You may take it off to take a shower or bath. °· You may have been given an elastic bandage to wear around your ankle to provide support. If the elastic bandage is too tight (you have numbness or tingling in your foot or your foot becomes cold and blue), adjust the bandage to make it comfortable. °· If you have an air splint, you may blow more air into it or let air out to make it more comfortable. You may take your splint off at night and before taking a shower or bath. Wiggle your toes in the splint several times per day to decrease swelling. °SEEK MEDICAL CARE IF:  °· You have rapidly increasing bruising or swelling. °· Your toes feel extremely cold or you lose feeling in your foot. °· Your pain is not relieved with medicine. °SEEK IMMEDIATE MEDICAL CARE IF: °· Your toes are numb or blue. °·   You have severe pain that is increasing. °MAKE SURE YOU:  °· Understand these instructions. °· Will watch your condition. °· Will get help right away if you are not doing well or get worse. °Document Released: 07/03/2005 Document Revised: 03/27/2012 Document Reviewed: 07/15/2011 °ExitCare® Patient Information ©2015 [employer]. This information is not intended to replace advice given to you by your health care provider. Make sure you discuss any questions you have with your health care provider. ° ° °

## 2014-03-24 ENCOUNTER — Ambulatory Visit: Payer: Self-pay

## 2014-03-24 ENCOUNTER — Other Ambulatory Visit: Payer: Self-pay | Admitting: Occupational Medicine

## 2014-03-24 DIAGNOSIS — Z0000 Encounter for general adult medical examination without abnormal findings: Secondary | ICD-10-CM

## 2014-04-06 ENCOUNTER — Other Ambulatory Visit (HOSPITAL_COMMUNITY): Payer: Self-pay | Admitting: Cardiology

## 2014-04-06 NOTE — Telephone Encounter (Signed)
Rx was sent to pharmacy electronically. Patient needs appointment for future refills. First warning given.

## 2014-04-16 ENCOUNTER — Other Ambulatory Visit (HOSPITAL_COMMUNITY): Payer: Self-pay | Admitting: Nephrology

## 2014-04-16 ENCOUNTER — Ambulatory Visit (HOSPITAL_COMMUNITY)
Admission: RE | Admit: 2014-04-16 | Discharge: 2014-04-16 | Disposition: A | Discharge status: Home / Self Care | Payer: Medicare Other | Source: Ambulatory Visit | Attending: Vascular Surgery | Admitting: Vascular Surgery

## 2014-04-16 DIAGNOSIS — I129 Hypertensive chronic kidney disease with stage 1 through stage 4 chronic kidney disease, or unspecified chronic kidney disease: Secondary | ICD-10-CM | POA: Insufficient documentation

## 2014-04-16 DIAGNOSIS — N189 Chronic kidney disease, unspecified: Secondary | ICD-10-CM | POA: Insufficient documentation

## 2014-04-16 DIAGNOSIS — I701 Atherosclerosis of renal artery: Secondary | ICD-10-CM

## 2014-04-21 ENCOUNTER — Telehealth: Payer: Self-pay | Admitting: Cardiology

## 2014-04-21 NOTE — Telephone Encounter (Signed)
Closed encounter °

## 2014-04-29 ENCOUNTER — Telehealth: Payer: Self-pay | Admitting: Internal Medicine

## 2014-04-29 NOTE — Telephone Encounter (Signed)
Received 4 pages records for patient for appointment on 05/29/14 with Dr Debara Pickett.  Records given to [facility] for Dr [facility] schedule for 05/29/14 lp  Records are from Dr [name]

## 2014-05-05 ENCOUNTER — Other Ambulatory Visit (HOSPITAL_COMMUNITY): Payer: Self-pay | Admitting: Cardiology

## 2014-05-05 NOTE — Telephone Encounter (Signed)
Rx was sent to pharmacy electronically. OV 11/13

## 2014-05-06 ENCOUNTER — Other Ambulatory Visit: Payer: Self-pay

## 2014-05-06 NOTE — Telephone Encounter (Signed)
Pt has never been seen in the office before. Pt needs an appointment

## 2014-05-12 ENCOUNTER — Other Ambulatory Visit: Payer: Self-pay

## 2014-05-12 MED ORDER — VALSARTAN 40 MG PO TABS: 40.0000 mg | ORAL_TABLET | Freq: Every day | ORAL | Status: DC

## 2014-05-14 ENCOUNTER — Other Ambulatory Visit: Payer: Self-pay

## 2014-05-14 MED ORDER — VALSARTAN 40 MG PO TABS: 40.0000 mg | ORAL_TABLET | Freq: Every day | ORAL | Status: DC

## 2014-05-14 NOTE — Telephone Encounter (Signed)
Rx sent to pharmacy   

## 2014-05-29 ENCOUNTER — Ambulatory Visit (INDEPENDENT_AMBULATORY_CARE_PROVIDER_SITE_OTHER): Payer: Medicare Other | Admitting: Internal Medicine

## 2014-05-29 ENCOUNTER — Encounter: Payer: Self-pay | Admitting: Internal Medicine

## 2014-05-29 VITALS — BP 132/84 | HR 76 | Ht 64.0 in | Wt 238.5 lb

## 2014-05-29 DIAGNOSIS — E119 Type 2 diabetes mellitus without complications: Secondary | ICD-10-CM

## 2014-05-29 DIAGNOSIS — Z794 Long term (current) use of insulin: Secondary | ICD-10-CM

## 2014-05-29 DIAGNOSIS — R9431 Abnormal electrocardiogram [ECG] [EKG]: Secondary | ICD-10-CM | POA: Insufficient documentation

## 2014-05-29 DIAGNOSIS — E785 Hyperlipidemia, unspecified: Secondary | ICD-10-CM

## 2014-05-29 DIAGNOSIS — IMO0001 Reserved for inherently not codable concepts without codable children: Secondary | ICD-10-CM

## 2014-05-29 DIAGNOSIS — I10 Essential (primary) hypertension: Secondary | ICD-10-CM

## 2014-05-29 NOTE — Patient Instructions (Signed)
Your physician wants you to follow-up in: 1 year with Dr. Hilty. You will receive a reminder letter in the mail two months in advance. If you don't receive a letter, please call our office to schedule the follow-up appointment.  

## 2014-05-29 NOTE — Progress Notes (Signed)
OFFICE NOTE  Chief Complaint:  Abnormal EKG  Primary Care Physician: [name], MD  HPI:  [name] [name] Is a pleasant 63 year old male with a history of insulin-dependent diabetes, dyslipidemia, and hypertension. He's been a diabetic since 1999. He's been on insulin for only 3-4 years. He was a former patient of Dr. Rollene Fare who ordered a stress test in 2014. This demonstrated reversible inferoapical ischemia and was read as moderate risk. He ultimately underwent heart catheterization by Dr. Ellyn Hack on 03/28/2013 which demonstrated a small, possibly minimal aneurysmal bleb in the LAD but no significant disease. There is a dominant RCA with no disease. Essentially there was no significant obstructive coronary disease. At the time his EKG did show inferior T-wave abnormalities and very small R waves but no evidence for Q waves. Today he is referred back for abnormalities EKG which directly compared to his old EKG shows no significant change. There is an IVCD and very small R waves inferiorly, which the computer may have this read as inferior infarct. I do not see any significant change compared to his previous studies. He denies any new or worsening chest pain or shortness of breath.  PMHx:  Past Medical History  Diagnosis Date  . COPD (chronic obstructive pulmonary disease)   . Sinusitis   . PUD (peptic ulcer disease)     remote, reports f/u EGD about 8 years ago unremarkable   . Diabetes   . GERD (gastroesophageal reflux disease)   . Hyperlipidemia   . Hypertension   . Sleep apnea   . Post traumatic stress disorder   . Anxiety   . Depression   . Bell palsy   . Obesity     [place]  . Heart disease     family history    Past Surgical History  Procedure Laterality Date  . Asad lt shoulder  12/2008    left shoulder  . Umbilical hernia repair  2007    [place]  . Knee arthroscopy  10/2007    left  . Toenail excision      removed x2-bilateral  . Eye surgery  12/22/2010   tear duct probing-[place]  . Foreign body removal  03/29/2011    Procedure: REMOVAL FOREIGN BODY EXTREMITY;  Surgeon: Arther Abbott, MD;  Location: [facility];  Service: Orthopedics;  Laterality: Right;  Removal Foreign Body Right Thumb  . Quadriceps tendon repair  07/21/2011    Procedure: REPAIR QUADRICEP TENDON;  Surgeon: Arther Abbott, MD;  Location: [facility];  Service: Orthopedics;  Laterality: Right;  . Nm myoview ltd    . Doppler echocardiography      FAMHx:  Family History  Problem Relation Age of Onset  . Hypertension Mother     MI  . Cancer Mother     breast   . Diabetes Mother   . Diabetes Father   . Hypertension Father   . Hypertension Sister   . Diabetes Sister   . Arthritis    . Asthma    . Lung disease    . Anesthesia problems Neg Hx   . Hypotension Neg Hx   . Malignant hyperthermia Neg Hx   . Pseudochol deficiency Neg Hx     SOCHx:   reports that he quit smoking about 4 years ago. His smoking use included Cigarettes. He has a 25 pack-year smoking history. He does not have any smokeless tobacco history on file. He reports that he does not drink alcohol or use illicit drugs.  ALLERGIES:  Allergies  Allergen Reactions  . Opana [Oxymorphone Hcl] Itching  . Tramadol Itching    ROS: A comprehensive review of systems was negative.  HOME MEDS: Current Outpatient Prescriptions  Medication Sig Dispense Refill  . albuterol (PROVENTIL HFA) 108 (90 BASE) MCG/ACT inhaler Inhale 2 puffs into the lungs every 4 (four) hours as needed for wheezing.     Marland Kitchen amLODipine (NORVASC) 10 MG tablet Take 10 mg by mouth daily.     . budesonide-formoterol (SYMBICORT) 160-4.5 MCG/ACT inhaler Inhale 2 puffs into the lungs daily as needed (Shortness of Breath).     . clopidogrel (PLAVIX) 75 MG tablet Take 1 tablet (75 mg total) by mouth daily. <appointment on 05/29/14> 30 tablet 0  . HYDROcodone-acetaminophen (NORCO/VICODIN) 5-325 MG per tablet Take 1 tablet by mouth every 6 (six) hours  as needed for moderate pain. 15 tablet 0  . insulin glargine (LANTUS) 100 UNIT/ML injection Inject 30 Units into the skin at bedtime.     . iron polysaccharides (NIFEREX) 150 MG capsule Take 150 mg by mouth daily.    Marland Kitchen loratadine (CLARITIN) 10 MG tablet Take 10 mg by mouth as needed for allergies.     . mometasone (NASONEX) 50 MCG/ACT nasal spray Place 2 sprays into the nose daily.     Marland Kitchen omeprazole (PRILOSEC) 20 MG capsule Take 20 mg by mouth daily.     . simvastatin (ZOCOR) 20 MG tablet Take 20 mg by mouth every morning.     . TRADJENTA 5 MG TABS tablet Take 1 tablet by mouth daily.    . valsartan (DIOVAN) 40 MG tablet Take 1 tablet (40 mg total) by mouth daily. 30 tablet 6   No current facility-administered medications for this visit.    LABS/IMAGING: No results found for this or any previous visit (from the past 48 hour(s)). No results found.  VITALS: BP 132/84 mmHg  Pulse 76  Ht 5\' 4"  (1.626 m)  Wt 238 lb 8 oz (108.183 kg)  BMI 40.92 kg/m2  EXAM: General appearance: alert and no distress Neck: no carotid bruit and no JVD Lungs: clear to auscultation bilaterally Heart: regular rate and rhythm, S1, S2 normal, no murmur, click, rub or gallop Abdomen: soft, non-tender; bowel sounds normal; no masses,  no organomegaly and morbidly obse Extremities: extremities normal, atraumatic, no cyanosis or edema Pulses: 2+ and symmetric Skin: Skin color, texture, turgor normal. No rashes or lesions Neurologic: Grossly normal Psyvh: Normal  EKG: Normal sinus rhythm with first-degree AV block at 76, inferior T-wave abnormalities, unchanged  ASSESSMENT: 1. No significant coronary artery disease by cath in September 2014 2. Insulin-dependent diabetes 3. Dyslipidemia-controlled 4. Hypertension-at goal 5. Morbid obesity  PLAN: 1.   Mr. Farson has numerous cardiac risk factors however those are being mitigated. He is on insulin for his diabetes and is being followed by his primary care  provider. His primary care provider also follows his dyslipidemia and hypertension and manages them appropriately. Is reassuring that he had no significant coronary disease by cath, despite having abnormalities in his stress test. I do not see any significant change in his EKG now compared to that last year. He wishes to follow-up with me is his cardiologist going forward for additional risk factor modification. I would recommend seeing him back a year or sooner as necessary, if he develops symptoms.  Pixie Casino, MD, [address] [facility] Attending Cardiologist [facility]  Josette Shimabukuro C 05/29/2014, 11:30 AM

## 2014-06-03 ENCOUNTER — Encounter: Payer: Self-pay | Admitting: Nutrition

## 2014-06-03 ENCOUNTER — Encounter: Payer: Medicare Other | Attending: "Endocrinology | Admitting: Nutrition

## 2014-06-03 VITALS — Ht 64.0 in | Wt 234.0 lb

## 2014-06-03 DIAGNOSIS — IMO0002 Reserved for concepts with insufficient information to code with codable children: Secondary | ICD-10-CM

## 2014-06-03 DIAGNOSIS — Z6841 Body Mass Index (BMI) 40.0 and over, adult: Secondary | ICD-10-CM | POA: Diagnosis not present

## 2014-06-03 DIAGNOSIS — E118 Type 2 diabetes mellitus with unspecified complications: Secondary | ICD-10-CM | POA: Insufficient documentation

## 2014-06-03 DIAGNOSIS — E1165 Type 2 diabetes mellitus with hyperglycemia: Secondary | ICD-10-CM

## 2014-06-03 DIAGNOSIS — E669 Obesity, unspecified: Secondary | ICD-10-CM | POA: Diagnosis not present

## 2014-06-03 DIAGNOSIS — Z713 Dietary counseling and surveillance: Secondary | ICD-10-CM | POA: Diagnosis not present

## 2014-06-03 NOTE — Patient Instructions (Signed)
Plan:  Aim for 2-3 Carb Choices per meal (30-45 grams) +/- 1 either way  Avoid snacks between meals. Cut down to only 1 egg yolk and 2 egg whites at breakfast. Don't skip meals. Include protein in moderation with your meals and snacks Consider  increasing your activity level by 30-60  minutes daily as tolerated Check BG fasting and 2 hours after supper daily for 1 month. Continue taking medication of Lantus and Trajenta daily as directed by MD Increase low carb vegetables to 2 servings per lunch and  dinner daily.  Goal: Lose 1 lb per week. Get A1C down to 7.5%  in three months.3, Reduce high fat high salt foods in diet to reduce BP and cholesterol levels.

## 2014-06-03 NOTE — Progress Notes (Signed)
  Medical Nutrition Therapy:  Appt start time: 0930 end time:  1030.   Assessment:  Primary concerns today: Diabetes. Has had it for about 25 years.. Lives with his wife and she does the grocery shopping and cooking. Lantus 35 units. Tragenta daily.  Most recent A1C was 8.8%. Has noted that he recently started drinking more alcohol of beer and whiskey than he use to but for no apparent reason. Not exercising at present.   Preferred Learning Style:     No preference indicated    Learning Readiness:     Ready  Change in progress   MEDICATIONS: see list   DIETARY INTAKE:   24-hr recall:  B ( AM): 2 eggs scrambed or boiled, coffee. Has cut out salt. 2 slices wheat bread,   Snk ( AM): none L ( PM): skips or eats a 1 slice of couinty ham on wheat bread, water Snk ( PM): none D ( PM): String beans, turnip greens, mashed potatoes, and neck bone, water Snk ( PM): 4 slices of cheese and 2 rolls. Beverages: water, beer (2-3 a day somedays); whiskey 2 oz.  Usual physical activity:   Estimated energy needs: 1800 calories 200 g carbohydrates 135 g protein 50 g fat  Progress Towards Goal(s):  In progress.   Nutritional Diagnosis:  NB-1.1 Food and nutrition-related knowledge deficit As related to diabetes.  As evidenced by A1C of 8.8%..    Intervention:  Nutrition counseling and diabetes education for weight loss and improved BS control and cardiovascular disease..  Plan:  Aim for 2-3 Carb Choices per meal (30-45 grams) +/- 1 either way  Avoid snacks between meals. Cut out alcohol Cut down to only 1 egg yolk and 2 egg whites at breakfast. Don't skip meals. Include protein in moderation with your meals and snacks Consider  increasing your activity level by 30-60  minutes daily as tolerated Check BG fasting and 2 hours after supper daily for 1 month. Continue taking medication of Lantus and Trajenta daily as directed by MD Increase low carb vegetables to 2 servings per  lunch and  dinner daily.  Goal: Lose 1 lb per week. Get A1C down to 7.5%  in three months.3, Reduce high fat high salt foods in diet to reduce BP and cholesterol levels.   Teaching Method Utilized: none Visual Auditory Hands on  Handouts given during visit include:  Carb Counting and Food Label handouts Meal Plan Card The Plate Method  Barriers to learning/adherence to lifestyle change: none  Demonstrated degree of understanding via:  Teach Back   Monitoring/Evaluation:  Dietary intake, exercise, meal planning, SBG, and body weight in 1 month(s).

## 2014-06-04 ENCOUNTER — Other Ambulatory Visit: Payer: Self-pay | Admitting: Cardiology

## 2014-06-04 NOTE — Telephone Encounter (Signed)
Rx has been sent to the pharmacy electronically. ° °

## 2014-06-25 ENCOUNTER — Encounter (HOSPITAL_COMMUNITY): Payer: Self-pay | Admitting: Cardiology

## 2014-07-03 ENCOUNTER — Ambulatory Visit: Payer: Self-pay | Admitting: Nutrition

## 2014-07-15 ENCOUNTER — Emergency Department (HOSPITAL_COMMUNITY): Payer: No Typology Code available for payment source

## 2014-07-15 ENCOUNTER — Encounter (HOSPITAL_COMMUNITY): Payer: Self-pay | Admitting: Emergency Medicine

## 2014-07-15 ENCOUNTER — Emergency Department (HOSPITAL_COMMUNITY)
Admission: EM | Admit: 2014-07-15 | Discharge: 2014-07-15 | Disposition: A | Discharge status: Home / Self Care | Payer: No Typology Code available for payment source | Attending: Emergency Medicine | Admitting: Emergency Medicine

## 2014-07-15 DIAGNOSIS — F419 Anxiety disorder, unspecified: Secondary | ICD-10-CM | POA: Diagnosis not present

## 2014-07-15 DIAGNOSIS — Z87891 Personal history of nicotine dependence: Secondary | ICD-10-CM | POA: Insufficient documentation

## 2014-07-15 DIAGNOSIS — Z7952 Long term (current) use of systemic steroids: Secondary | ICD-10-CM | POA: Insufficient documentation

## 2014-07-15 DIAGNOSIS — E119 Type 2 diabetes mellitus without complications: Secondary | ICD-10-CM | POA: Diagnosis not present

## 2014-07-15 DIAGNOSIS — Z7902 Long term (current) use of antithrombotics/antiplatelets: Secondary | ICD-10-CM | POA: Diagnosis not present

## 2014-07-15 DIAGNOSIS — Y92410 Unspecified street and highway as the place of occurrence of the external cause: Secondary | ICD-10-CM | POA: Insufficient documentation

## 2014-07-15 DIAGNOSIS — Z79899 Other long term (current) drug therapy: Secondary | ICD-10-CM | POA: Diagnosis not present

## 2014-07-15 DIAGNOSIS — Y998 Other external cause status: Secondary | ICD-10-CM | POA: Diagnosis not present

## 2014-07-15 DIAGNOSIS — S199XXA Unspecified injury of neck, initial encounter: Secondary | ICD-10-CM | POA: Insufficient documentation

## 2014-07-15 DIAGNOSIS — J449 Chronic obstructive pulmonary disease, unspecified: Secondary | ICD-10-CM | POA: Insufficient documentation

## 2014-07-15 DIAGNOSIS — Y9389 Activity, other specified: Secondary | ICD-10-CM | POA: Diagnosis not present

## 2014-07-15 DIAGNOSIS — Z794 Long term (current) use of insulin: Secondary | ICD-10-CM | POA: Diagnosis not present

## 2014-07-15 DIAGNOSIS — S299XXA Unspecified injury of thorax, initial encounter: Secondary | ICD-10-CM | POA: Diagnosis not present

## 2014-07-15 DIAGNOSIS — F329 Major depressive disorder, single episode, unspecified: Secondary | ICD-10-CM | POA: Diagnosis not present

## 2014-07-15 DIAGNOSIS — S8991XA Unspecified injury of right lower leg, initial encounter: Secondary | ICD-10-CM | POA: Diagnosis not present

## 2014-07-15 DIAGNOSIS — E669 Obesity, unspecified: Secondary | ICD-10-CM | POA: Insufficient documentation

## 2014-07-15 DIAGNOSIS — S3992XA Unspecified injury of lower back, initial encounter: Secondary | ICD-10-CM | POA: Diagnosis not present

## 2014-07-15 DIAGNOSIS — E785 Hyperlipidemia, unspecified: Secondary | ICD-10-CM | POA: Insufficient documentation

## 2014-07-15 DIAGNOSIS — I10 Essential (primary) hypertension: Secondary | ICD-10-CM | POA: Diagnosis not present

## 2014-07-15 DIAGNOSIS — Z8669 Personal history of other diseases of the nervous system and sense organs: Secondary | ICD-10-CM | POA: Diagnosis not present

## 2014-07-15 LAB — I-STAT CHEM 8, ED
BUN: 32 mg/dL — ABNORMAL HIGH (ref 6–23)
Calcium, Ion: 1.19 mmol/L (ref 1.13–1.30)
Chloride: 106 mEq/L (ref 96–112)
Creatinine, Ser: 2.5 mg/dL — ABNORMAL HIGH (ref 0.50–1.35)
Glucose, Bld: 177 mg/dL — ABNORMAL HIGH (ref 70–99)
HEMATOCRIT: 37 % — AB (ref 39.0–52.0)
Hemoglobin: 12.6 g/dL — ABNORMAL LOW (ref 13.0–17.0)
POTASSIUM: 4.2 mmol/L (ref 3.5–5.1)
Sodium: 142 mmol/L (ref 135–145)
TCO2: 24 mmol/L (ref 0–100)

## 2014-07-15 MED ORDER — MORPHINE SULFATE 4 MG/ML IJ SOLN
4.0000 mg | Freq: Once | INTRAMUSCULAR | Status: AC
  Administered 2014-07-15: 4 mg via INTRAVENOUS
  Filled 2014-07-15: qty 1

## 2014-07-15 MED ORDER — OXYCODONE-ACETAMINOPHEN 5-325 MG PO TABS: 1.0000 | ORAL_TABLET | Freq: Four times a day (QID) | ORAL | Status: DC | PRN
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
## 2014-07-15 NOTE — ED Notes (Signed)
Pt alert & oriented x4, stable gait. Patient given discharge instructions, paperwork & prescription(s). Patient  instructed to stop at the registration desk to finish any additional paperwork. Patient verbalized understanding. Pt left department w/ no further questions. 
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
## 2014-07-24 ENCOUNTER — Other Ambulatory Visit: Payer: Self-pay | Admitting: *Deleted

## 2014-07-24 MED ORDER — CLOPIDOGREL BISULFATE 75 MG PO TABS: 75.0000 mg | ORAL_TABLET | Freq: Every day | ORAL | Status: DC

## 2014-07-24 MED ORDER — VALSARTAN 40 MG PO TABS: 40.0000 mg | ORAL_TABLET | Freq: Every day | ORAL | Status: DC

## 2014-07-24 NOTE — Telephone Encounter (Signed)
Rx refill sent to patient pharmacy   

## 2014-08-06 ENCOUNTER — Ambulatory Visit (INDEPENDENT_AMBULATORY_CARE_PROVIDER_SITE_OTHER): Payer: Medicare Other | Admitting: Orthopedic Surgery

## 2014-08-06 ENCOUNTER — Encounter: Payer: Self-pay | Admitting: Orthopedic Surgery

## 2014-08-06 VITALS — HR 76 | Ht 64.0 in | Wt 234.0 lb

## 2014-08-06 DIAGNOSIS — G894 Chronic pain syndrome: Secondary | ICD-10-CM

## 2014-08-06 DIAGNOSIS — S8001XA Contusion of right knee, initial encounter: Secondary | ICD-10-CM

## 2014-08-06 DIAGNOSIS — M545 Low back pain, unspecified: Secondary | ICD-10-CM

## 2014-08-06 DIAGNOSIS — R0781 Pleurodynia: Secondary | ICD-10-CM

## 2014-08-06 MED ORDER — OXYCODONE-ACETAMINOPHEN 5-325 MG PO TABS: 1.0000 | ORAL_TABLET | ORAL | Status: DC | PRN

## 2014-08-06 NOTE — Patient Instructions (Signed)
Will refer to pain management in [place]
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
## 2014-08-19 ENCOUNTER — Other Ambulatory Visit: Payer: Self-pay | Admitting: *Deleted

## 2014-08-19 ENCOUNTER — Telehealth: Payer: Self-pay | Admitting: *Deleted

## 2014-08-19 DIAGNOSIS — M545 Low back pain, unspecified: Secondary | ICD-10-CM

## 2014-08-19 DIAGNOSIS — G8929 Other chronic pain: Secondary | ICD-10-CM

## 2014-08-19 NOTE — Telephone Encounter (Signed)
REFERRAL SENT TO [facility]

## 2014-08-27 ENCOUNTER — Ambulatory Visit (INDEPENDENT_AMBULATORY_CARE_PROVIDER_SITE_OTHER): Payer: Medicare Other | Admitting: Orthopedic Surgery

## 2014-08-27 VITALS — BP 148/83 | Ht 64.0 in | Wt 234.0 lb

## 2014-08-27 DIAGNOSIS — M545 Low back pain, unspecified: Secondary | ICD-10-CM

## 2014-08-27 MED ORDER — HYDROCODONE-ACETAMINOPHEN 5-325 MG PO TABS: 1.0000 | ORAL_TABLET | ORAL | Status: DC | PRN

## 2014-08-27 NOTE — Progress Notes (Signed)
Chief Complaint  Patient presents with  . Follow-up    follow up right knee, DOI 07/13/14    Patient actually here to follow-up for his lower back pain he did have a knee injury but that is a contusion  He still having lower back symptoms  Review of systems he's having postprandial pain in the epigastric region and also nighttime pain in the epigastric region when he's lying down.  His CT scan showed no significant injury to his chest wall. I'm referring him back to his primary care physician to evaluate his reflux symptoms he is on a medication for  As far as his back goes he's having some mild radicular symptoms up to his knee but not below no weakness  His exam shows tenderness in the lower back with normal strength in the left leg  He's run out of his pain medication so we refilled his hydrocodone he's been referred to pain management where waiting that  He is seeing a chiropractor for his back symptoms so there is no surgical intervention needed on my part follow-up is as needed

## 2014-09-09 ENCOUNTER — Encounter: Payer: Self-pay | Admitting: Podiatry

## 2014-09-09 ENCOUNTER — Ambulatory Visit (INDEPENDENT_AMBULATORY_CARE_PROVIDER_SITE_OTHER): Payer: Medicare Other | Admitting: Podiatry

## 2014-09-09 VITALS — BP 183/97 | HR 74 | Resp 15 | Ht 64.0 in | Wt 239.0 lb

## 2014-09-09 DIAGNOSIS — B351 Tinea unguium: Secondary | ICD-10-CM

## 2014-09-09 DIAGNOSIS — M79676 Pain in unspecified toe(s): Secondary | ICD-10-CM

## 2014-09-09 NOTE — Progress Notes (Signed)
   Subjective:    Patient ID: [name] [name], male    DOB: [birth year], 64 y.o.   MRN: [id number]  HPI Comments: N diabetic foot exam and debridement L 10 toenails D and O long-term C elongated, thickened A diabetic pt and difficulty cutting T Dr. Caprice Beaver hx of debridement and exam  Diabetes   he denies any history of diabetic foot ulcers or claudication He is disabled    Review of Systems  All other systems reviewed and are negative.      Objective:   Physical Exam  Orientated 3  Vascular: DP pulses 2/4 bilaterally PT pulses 2/4 bilaterally Capillary reflex immediate bilaterally  Neurological: Sensation to 10 g monofilament wire tie 5/5 bilaterally Vibratory sensation intact bilaterally Ankle reflex equal and reactive bilaterally  Dermatological: The toenails are extremely hypertrophic, incurvated, elongated, discolored and tender to palpation 6-10  Musculoskeletal: Pes planus bilaterally HAV bilaterally Hammertoe second left There is no restriction ankle, subtalar, midtarsal joints bilaterally     Assessment & Plan:   Assessment: Satisfactory neurovascular status Symptomatic onychomycoses 6-10  Plan: Debridement toenails 10 without any bleeding  Reappoint 3 months

## 2014-09-09 NOTE — Patient Instructions (Signed)
Diabetes and Foot Care Diabetes may cause you to have problems because of poor blood supply (circulation) to your feet and legs. This may cause the skin on your feet to become thinner, break easier, and heal more slowly. Your skin may become dry, and the skin may peel and crack. You may also have nerve damage in your legs and feet causing decreased feeling in them. You may not notice minor injuries to your feet that could lead to infections or more serious problems. Taking care of your feet is one of the most important things you can do for yourself.  HOME CARE INSTRUCTIONS  Wear shoes at all times, even in the house. Do not go barefoot. Bare feet are easily injured.  Check your feet daily for blisters, cuts, and redness. If you cannot see the bottom of your feet, use a mirror or ask someone for help.  Wash your feet with warm water (do not use hot water) and mild soap. Then pat your feet and the areas between your toes until they are completely dry. Do not soak your feet as this can dry your skin.  Apply a moisturizing lotion or petroleum jelly (that does not contain alcohol and is unscented) to the skin on your feet and to dry, brittle toenails. Do not apply lotion between your toes.  Trim your toenails straight across. Do not dig under them or around the cuticle. File the edges of your nails with an emery board or nail file.  Do not cut corns or calluses or try to remove them with medicine.  Wear clean socks or stockings every day. Make sure they are not too tight. Do not wear knee-high stockings since they may decrease blood flow to your legs.  Wear shoes that fit properly and have enough cushioning. To break in new shoes, wear them for just a few hours a day. This prevents you from injuring your feet. Always look in your shoes before you put them on to be sure there are no objects inside.  Do not cross your legs. This may decrease the blood flow to your feet.  If you find a minor scrape,  cut, or break in the skin on your feet, keep it and the skin around it clean and dry. These areas may be cleansed with mild soap and water. Do not cleanse the area with peroxide, alcohol, or iodine.  When you remove an adhesive bandage, be sure not to damage the skin around it.  If you have a wound, look at it several times a day to make sure it is healing.  Do not use heating pads or hot water bottles. They may burn your skin. If you have lost feeling in your feet or legs, you may not know it is happening until it is too late.  Make sure your health care provider performs a complete foot exam at least annually or more often if you have foot problems. Report any cuts, sores, or bruises to your health care provider immediately. SEEK MEDICAL CARE IF:   You have an injury that is not healing.  You have cuts or breaks in the skin.  You have an ingrown nail.  You notice redness on your legs or feet.  You feel burning or tingling in your legs or feet.  You have pain or cramps in your legs and feet.  Your legs or feet are numb.  Your feet always feel cold. SEEK IMMEDIATE MEDICAL CARE IF:   There is increasing redness,   swelling, or pain in or around a wound.  There is a red line that goes up your leg.  Pus is coming from a wound.  You develop a fever or as directed by your health care provider.  You notice a bad smell coming from an ulcer or wound. Document Released: 06/30/2000 Document Revised: 03/05/2013 Document Reviewed: 12/10/2012 Exit[employer] Patient Information 2015 [employer]. This information is not intended to replace advice given to you by your health care provider. Make sure you discuss any questions you have with your health care provider.  

## 2014-09-11 NOTE — Telephone Encounter (Signed)
CALL RECEIVED THAT [facility] DOES NOT ACCEPT PATIENTS INSURANCE  NEW REFERRAL FAXED TO [facility]

## 2014-10-13 ENCOUNTER — Telehealth: Payer: Self-pay | Admitting: *Deleted

## 2014-10-13 ENCOUNTER — Other Ambulatory Visit: Payer: Self-pay | Admitting: *Deleted

## 2014-10-13 DIAGNOSIS — G894 Chronic pain syndrome: Secondary | ICD-10-CM

## 2014-10-13 NOTE — Telephone Encounter (Signed)
REFERRAL FAXED TO [facility] [id number] PAIN

## 2014-10-15 NOTE — Telephone Encounter (Signed)
REFERRAL FAXED TO [facility]  PATIENT AWARE, INSURANCE REQUIRES IN STATE REFERRAL

## 2014-10-28 NOTE — Telephone Encounter (Signed)
Spoke with [facility] and they have been in contact with patient and awaiting Dr Freddie Apley notes and then he will be scheduled

## 2014-11-09 ENCOUNTER — Encounter: Payer: Self-pay | Admitting: Gastroenterology

## 2014-12-01 ENCOUNTER — Encounter: Payer: Self-pay | Admitting: Gastroenterology

## 2014-12-01 ENCOUNTER — Other Ambulatory Visit: Payer: Self-pay

## 2014-12-01 ENCOUNTER — Ambulatory Visit (INDEPENDENT_AMBULATORY_CARE_PROVIDER_SITE_OTHER): Payer: Medicare Other | Admitting: Gastroenterology

## 2014-12-01 VITALS — BP 149/95 | HR 74 | Temp 97.3°F | Ht 64.0 in | Wt 241.2 lb

## 2014-12-01 DIAGNOSIS — R160 Hepatomegaly, not elsewhere classified: Secondary | ICD-10-CM | POA: Insufficient documentation

## 2014-12-01 DIAGNOSIS — K219 Gastro-esophageal reflux disease without esophagitis: Secondary | ICD-10-CM

## 2014-12-01 DIAGNOSIS — D509 Iron deficiency anemia, unspecified: Secondary | ICD-10-CM

## 2014-12-01 DIAGNOSIS — R932 Abnormal findings on diagnostic imaging of liver and biliary tract: Secondary | ICD-10-CM

## 2014-12-01 DIAGNOSIS — K760 Fatty (change of) liver, not elsewhere classified: Secondary | ICD-10-CM | POA: Insufficient documentation

## 2014-12-01 MED ORDER — PEG 3350-KCL-NA BICARB-NACL 420 G PO SOLR: 4000.0000 mL | Freq: Once | ORAL | Status: DC

## 2014-12-01 NOTE — Progress Notes (Signed)
Primary Care Physician:  [name], MD Referring Physician: Dr. Ulice Bold Primary Gastroenterologist:  Barney Drain, MD   Chief Complaint  Patient presents with  . OTHER    Abnormal labs    HPI:  [name] [name] is a 64 y.o. male here at the request of Dr.Manpreet Theador Hawthorne for further evaluation of iron deficiency anemia. Patient is followed by Dr. Greta Doom for stage III chronic renal disease. Hemoglobin has been at goal. There has been no need of Epogen. He has been on iron supplements intermittently due to patient noncompliance.  07/2014   10/2014 Iron 107  Iron 72 TIBC 264  TIBC 371 fe sat% 29  fe sat% 19% Ferritin 57  ferritin 37 H/H 12.7/38.4  H/H 12.7/38.6    Folate 8.3    B12 543    BUN 28    Cre 2.65   Unsure how long IDA but clearing trending for back 5-6 months. No associated blood in stool. Has been mild and managed with oral supplements only. Denies SOB, increased fatigue, cravings for ice. Patient denies any aspirin or NSAIDs. He states his stools have been dark on iron supplements. Bowel movements are regular. No blood in the stool. Denies any abdominal pain. Appetite is good. No heartburn (on pantoprazole), dysphagia, vomiting. Rarely consumes alcohol at this time. In the past he used to drink 1/5 of liquor every 2 weeks.   He had a colonoscopy in April 2010 which showed a rectal polyp, obliterated and not retrieved. Remote EGD, patient gives history of prior PUD.   History of prominent liver measuring 19.1 cm on noncontrast CT of the abdomen back in December 2015. Occasional subcentimeter foci of decreased attenuation consistent with either small cyst or hamartoma seen.   Current Outpatient Prescriptions  Medication Sig Dispense Refill  . albuterol (PROVENTIL HFA) 108 (90 BASE) MCG/ACT inhaler Inhale 2 puffs into the lungs every 4 (four) hours as needed for wheezing.     Marland Kitchen amLODipine (NORVASC) 10 MG tablet Take 10 mg by mouth daily.     .  budesonide-formoterol (SYMBICORT) 160-4.5 MCG/ACT inhaler Inhale 2 puffs into the lungs daily as needed (Shortness of Breath).     . Cholecalciferol (VITAMIN D PO) Take by mouth.    . clopidogrel (PLAVIX) 75 MG tablet Take 1 tablet (75 mg total) by mouth daily. 30 tablet 10  . Cyanocobalamin (VITAMIN B 12 PO) Take by mouth.    . insulin glargine (LANTUS) 100 UNIT/ML injection Inject 35 Units into the skin at bedtime.     . iron polysaccharides (NIFEREX) 150 MG capsule Take 150 mg by mouth daily.    Marland Kitchen loratadine (CLARITIN) 10 MG tablet Take 10 mg by mouth as needed for allergies.     . mometasone (NASONEX) 50 MCG/ACT nasal spray Place 2 sprays into the nose daily.     . pantoprazole (PROTONIX) 40 MG tablet Take 40 mg by mouth daily.    . simvastatin (ZOCOR) 20 MG tablet Take 20 mg by mouth every morning.     . TRADJENTA 5 MG TABS tablet Take 1 tablet by mouth daily.    . valsartan (DIOVAN) 40 MG tablet Take 1 tablet (40 mg total) by mouth daily. 30 tablet 6   No current facility-administered medications for this visit.    Allergies as of 12/01/2014 - Review Complete 12/01/2014  Allergen Reaction Noted  . Opana [oxymorphone hcl] Itching 01/15/2014  . Tramadol Itching 07/10/2011    Past Medical History  Diagnosis Date  .  COPD (chronic obstructive pulmonary disease)   . Sinusitis   . PUD (peptic ulcer disease)     remote, reports f/u EGD about 8 years ago unremarkable   . Diabetes   . GERD (gastroesophageal reflux disease)   . Hyperlipidemia   . Hypertension   . Sleep apnea   . Post traumatic stress disorder   . Anxiety   . Depression   . Bell palsy   . Obesity     Truncal  . Heart disease     family history  . Hepatomegaly     noted on noncontrast CT 2015  . Vitamin D deficiency     Past Surgical History  Procedure Laterality Date  . Asad lt shoulder  12/2008    left shoulder  . Umbilical hernia repair  2007    [place]  . Knee arthroscopy  10/2007    left  . Toenail  excision      removed x2-bilateral  . Eye surgery  12/22/2010    tear duct probing-[place]  . Foreign body removal  03/29/2011    Procedure: REMOVAL FOREIGN BODY EXTREMITY;  Surgeon: Arther Abbott, MD;  Location: [facility];  Service: Orthopedics;  Laterality: Right;  Removal Foreign Body Right Thumb  . Quadriceps tendon repair  07/21/2011    Procedure: REPAIR QUADRICEP TENDON;  Surgeon: Arther Abbott, MD;  Location: [facility];  Service: Orthopedics;  Laterality: Right;  . Nm myoview ltd    . Doppler echocardiography    . Left heart catheterization with coronary angiogram N/A 03/28/2013    Procedure: LEFT HEART CATHETERIZATION WITH CORONARY ANGIOGRAM;  Surgeon: Leonie Man, MD;  Location: [facility] CATH LAB;  Service: Cardiovascular;  Laterality: N/A;  . Colonoscopy  10/2008    Fields: Rectal polyp obliterated, not retrieved, hemorrhoids, single ascending colon diverticulum near the CV. Next colonoscopy April 2020    Family History  Problem Relation Age of Onset  . Hypertension Mother     MI  . Cancer Mother     breast   . Diabetes Mother   . Diabetes Father   . Hypertension Father   . Hypertension Sister   . Diabetes Sister   . Arthritis    . Asthma    . Lung disease    . Anesthesia problems Neg Hx   . Hypotension Neg Hx   . Malignant hyperthermia Neg Hx   . Pseudochol deficiency Neg Hx   . Colon cancer Neg Hx     History   Social History  . Marital Status: Married    Spouse Name: N/A  . Number of Children: 2  . Years of Education: 12th grade   Occupational History  . disabled   .     Marland Kitchen     Social History Main Topics  . Smoking status: Former Smoker -- 1.00 packs/day for 25 years    Types: Cigarettes    Quit date: 03/27/2010  . Smokeless tobacco: Not on file     Comment: Quit x 7 years  . Alcohol Use: 0.0 oz/week    0 Standard drinks or equivalent per week     Comment: occasionally  . Drug Use: No     Comment: stopped feb2011  . Sexual Activity: Not on file    Other Topics Concern  . Not on file   Social History Narrative   He quit smoking in 2010. 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 He developed pulmonary problems, became  disabled because of lower airway disease in 2009.       ROS:  General: Negative for anorexia, weight loss, fever, chills, fatigue, weakness. Eyes: Negative for vision changes.  ENT: Negative for hoarseness, difficulty swallowing , nasal congestion. CV: Negative for chest pain, angina, palpitations, dyspnea on exertion, peripheral edema.  Respiratory: Negative for dyspnea at rest, dyspnea on exertion, cough, sputum, wheezing.  GI: See history of present illness. GU:  Negative for dysuria, hematuria, urinary incontinence, urinary frequency, nocturnal urination.  MS: Negative for joint pain, low back pain.  Derm: Negative for rash or itching.  Neuro: Negative for weakness, abnormal sensation, seizure, frequent headaches, memory loss, confusion.  Psych: Negative for anxiety, depression, suicidal ideation, hallucinations.  Endo: Negative for unusual weight change.  Heme: Negative for bruising or bleeding. Allergy: Negative for rash or hives.    Physical Examination:  BP 149/95 mmHg  Pulse 74  Temp(Src) 97.3 F (36.3 C) (Oral)  Ht 5\' 4"  (1.626 m)  Wt 241 lb 3.2 oz (109.408 kg)  BMI 41.38 kg/m2   General: Well-nourished, well-developed in no acute distress.  Head: Normocephalic, atraumatic.   Eyes: Conjunctiva pink, no icterus. Mouth: Oropharyngeal mucosa moist and pink , no lesions erythema or exudate. Neck: Supple without thyromegaly, masses, or lymphadenopathy.  Lungs: Clear to auscultation bilaterally.  Heart: Regular rate and rhythm, no murmurs rubs or gallops.  Abdomen: Bowel sounds are normal, nontender, nondistended, no hepatosplenomegaly or masses, no abdominal bruits or    hernia , no rebound or guarding.   Rectal: not performed Extremities: No lower  extremity edema. No clubbing or deformities.  Neuro: Alert and oriented x 4 , grossly normal neurologically.  Skin: Warm and dry, no rash or jaundice.   Psych: Alert and cooperative, normal mood and affect.  Labs: See HPI.  Imaging Studies: No results found.

## 2014-12-01 NOTE — Patient Instructions (Signed)
1. Colonoscopy and upper endoscopy with Dr. Oneida Alar. See separate instructions.  2. We may do a liver ultrasound after your procedures but we will await findings to decide.

## 2014-12-03 ENCOUNTER — Encounter (HOSPITAL_COMMUNITY): Payer: Self-pay | Admitting: Emergency Medicine

## 2014-12-03 ENCOUNTER — Emergency Department (HOSPITAL_COMMUNITY)
Admission: EM | Admit: 2014-12-03 | Discharge: 2014-12-03 | Disposition: A | Discharge status: Home / Self Care | Payer: Medicare Other | Attending: Emergency Medicine | Admitting: Emergency Medicine

## 2014-12-03 ENCOUNTER — Emergency Department (HOSPITAL_COMMUNITY): Payer: Medicare Other

## 2014-12-03 DIAGNOSIS — I10 Essential (primary) hypertension: Secondary | ICD-10-CM | POA: Diagnosis not present

## 2014-12-03 DIAGNOSIS — Z87891 Personal history of nicotine dependence: Secondary | ICD-10-CM | POA: Diagnosis not present

## 2014-12-03 DIAGNOSIS — M7662 Achilles tendinitis, left leg: Secondary | ICD-10-CM | POA: Insufficient documentation

## 2014-12-03 DIAGNOSIS — E119 Type 2 diabetes mellitus without complications: Secondary | ICD-10-CM | POA: Insufficient documentation

## 2014-12-03 DIAGNOSIS — M79672 Pain in left foot: Secondary | ICD-10-CM | POA: Diagnosis present

## 2014-12-03 DIAGNOSIS — E785 Hyperlipidemia, unspecified: Secondary | ICD-10-CM | POA: Diagnosis not present

## 2014-12-03 DIAGNOSIS — Z8669 Personal history of other diseases of the nervous system and sense organs: Secondary | ICD-10-CM | POA: Diagnosis not present

## 2014-12-03 DIAGNOSIS — J449 Chronic obstructive pulmonary disease, unspecified: Secondary | ICD-10-CM | POA: Insufficient documentation

## 2014-12-03 DIAGNOSIS — M7661 Achilles tendinitis, right leg: Secondary | ICD-10-CM

## 2014-12-03 DIAGNOSIS — Z7902 Long term (current) use of antithrombotics/antiplatelets: Secondary | ICD-10-CM | POA: Insufficient documentation

## 2014-12-03 DIAGNOSIS — F419 Anxiety disorder, unspecified: Secondary | ICD-10-CM | POA: Insufficient documentation

## 2014-12-03 DIAGNOSIS — F329 Major depressive disorder, single episode, unspecified: Secondary | ICD-10-CM | POA: Insufficient documentation

## 2014-12-03 DIAGNOSIS — Z79899 Other long term (current) drug therapy: Secondary | ICD-10-CM | POA: Insufficient documentation

## 2014-12-03 DIAGNOSIS — Z794 Long term (current) use of insulin: Secondary | ICD-10-CM | POA: Diagnosis not present

## 2014-12-03 DIAGNOSIS — E669 Obesity, unspecified: Secondary | ICD-10-CM | POA: Diagnosis not present

## 2014-12-03 DIAGNOSIS — Z7951 Long term (current) use of inhaled steroids: Secondary | ICD-10-CM | POA: Insufficient documentation

## 2014-12-03 MED ORDER — HYDROCODONE-ACETAMINOPHEN 5-325 MG PO TABS
2.0000 | ORAL_TABLET | Freq: Once | ORAL | Status: AC
  Administered 2014-12-03: 2 via ORAL
  Filled 2014-12-03: qty 2

## 2014-12-03 MED ORDER — HYDROCODONE-ACETAMINOPHEN 5-325 MG PO TABS: 1.0000 | ORAL_TABLET | ORAL | Status: DC | PRN

## 2014-12-03 MED ORDER — DIPHENHYDRAMINE HCL 12.5 MG/5ML PO ELIX
12.5000 mg | ORAL_SOLUTION | Freq: Once | ORAL | Status: AC
  Administered 2014-12-03: 12.5 mg via ORAL
  Filled 2014-12-03: qty 5

## 2014-12-03 NOTE — Discharge Instructions (Signed)
Your x-ray shows multiple areas of arthritis. It also questions tendinitis involving your Achilles tendon. Please soak your foot and ankle and warm Epsom salt water 2 times daily. Please use Norco for pain. Please see Dr. Caprice Beaver, or the podiatrist of your choice for possible injection of this tendon site. Achilles Tendinitis Achilles tendinitis is inflammation of the tough, cord-like band that attaches the lower muscles of your leg to your heel (Achilles tendon). It is usually caused by overusing the tendon and joint involved.  CAUSES Achilles tendinitis can happen because of:  A sudden increase in exercise or activity (such as running).  Doing the same exercises or activities (such as jumping) over and over.  Not warming up calf muscles before exercising.  Exercising in shoes that are worn out or not made for exercise.  Having arthritis or a bone growth on the back of the heel bone. This can rub against the tendon and hurt the tendon. SIGNS AND SYMPTOMS The most common symptoms are:  Pain in the back of the leg, just above the heel. The pain usually gets worse with exercise and better with rest.  Stiffness or soreness in the back of the leg, especially in the morning.  Swelling of the skin over the Achilles tendon.  Trouble standing on tiptoe. Sometimes, an Achilles tendon tears (ruptures). Symptoms of an Achilles tendon rupture can include:  Sudden, severe pain in the back of the leg.  Trouble putting weight on the foot or walking normally. DIAGNOSIS Achilles tendinitis will be diagnosed based on symptoms and a physical examination. An X-ray may be done to check if another condition is causing your symptoms. An MRI may be ordered if your health care provider suspects you may have completely torn your tendon, which is called an Achilles tendon rupture.  TREATMENT  Achilles tendinitis usually gets better over time. It can take weeks to months to heal completely. Treatment focuses  on treating the symptoms and helping the injury heal. HOME CARE INSTRUCTIONS   Rest your Achilles tendon and avoid activities that cause pain.  Apply ice to the injured area:  Put ice in a plastic bag.  Place a towel between your skin and the bag.  Leave the ice on for 20 minutes, 2-3 times a day  Try to avoid using the tendon (other than gentle range of motion) while the tendon is painful. Do not resume use until instructed by your health care provider. Then begin use gradually. Do not increase use to the point of pain. If pain does develop, decrease use and continue the above measures. Gradually increase activities that do not cause discomfort until you achieve normal use.  Do exercises to make your calf muscles stronger and more flexible. Your health care provider or physical therapist can recommend exercises for you to do.  Wrap your ankle with an elastic bandage or other wrap. This can help keep your tendon from moving too much. Your health care provider will show you how to wrap your ankle correctly.  Only take over-the-counter or prescription medicines for pain, discomfort, or fever as directed by your health care provider. SEEK MEDICAL CARE IF:   Your pain and swelling increase or pain is uncontrolled with medicines.  You develop new, unexplained symptoms or your symptoms get worse.  You are unable to move your toes or foot.  You develop warmth and swelling in your foot.  You have an unexplained temperature. MAKE SURE YOU:   Understand these instructions.  Will watch your  condition.  Will get help right away if you are not doing well or get worse. Document Released: 04/12/2005 Document Revised: 04/23/2013 Document Reviewed: 02/12/2013 [facility] Patient Information 2015 [place], [place]. This information is not intended to replace advice given to you by your health care provider. Make sure you discuss any questions you have with your health care provider. As he thinks he  was in her

## 2014-12-03 NOTE — ED Provider Notes (Signed)
CSN: [id number]     Arrival date & time 12/03/14  2051 History   First MD Initiated Contact with Patient 12/03/14 2204     Chief Complaint  Patient presents with  . Foot Pain     (Consider location/radiation/quality/duration/timing/severity/associated sxs/prior Treatment) Patient is a 64 y.o. male presenting with lower extremity pain. The history is provided by the patient.  Foot Pain This is a new problem. The current episode started today. The problem occurs intermittently. The problem has been gradually worsening. Associated symptoms include arthralgias. Pertinent negatives include no chills or fever. The symptoms are aggravated by walking and standing (palpation of the heel). He has tried nothing for the symptoms. The treatment provided no relief.    Past Medical History  Diagnosis Date  . COPD (chronic obstructive pulmonary disease)   . Sinusitis   . PUD (peptic ulcer disease)     remote, reports f/u EGD about 8 years ago unremarkable   . Diabetes   . GERD (gastroesophageal reflux disease)   . Hyperlipidemia   . Hypertension   . Sleep apnea   . Post traumatic stress disorder   . Anxiety   . Depression   . Bell palsy   . Obesity     Truncal  . Heart disease     family history  . Hepatomegaly     noted on noncontrast CT 2015  . Vitamin D deficiency    Past Surgical History  Procedure Laterality Date  . Asad lt shoulder  12/2008    left shoulder  . Umbilical hernia repair  2007    [place]  . Knee arthroscopy  10/2007    left  . Toenail excision      removed x2-bilateral  . Eye surgery  12/22/2010    tear duct probing-[place]  . Foreign body removal  03/29/2011    Procedure: REMOVAL FOREIGN BODY EXTREMITY;  Surgeon: Arther Abbott, MD;  Location: [facility];  Service: Orthopedics;  Laterality: Right;  Removal Foreign Body Right Thumb  . Quadriceps tendon repair  07/21/2011    Procedure: REPAIR QUADRICEP TENDON;  Surgeon: Arther Abbott, MD;  Location: [facility];  Service:  Orthopedics;  Laterality: Right;  . Nm myoview ltd    . Doppler echocardiography    . Left heart catheterization with coronary angiogram N/A 03/28/2013    Procedure: LEFT HEART CATHETERIZATION WITH CORONARY ANGIOGRAM;  Surgeon: Leonie Man, MD;  Location: [facility] CATH LAB;  Service: Cardiovascular;  Laterality: N/A;   Family History  Problem Relation Age of Onset  . Hypertension Mother     MI  . Cancer Mother     breast   . Diabetes Mother   . Diabetes Father   . Hypertension Father   . Hypertension Sister   . Diabetes Sister   . Arthritis    . Asthma    . Lung disease    . Anesthesia problems Neg Hx   . Hypotension Neg Hx   . Malignant hyperthermia Neg Hx   . Pseudochol deficiency Neg Hx   . Colon cancer Neg Hx    History  Substance Use Topics  . Smoking status: Former Smoker -- 1.00 packs/day for 25 years    Types: Cigarettes    Quit date: 03/27/2010  . Smokeless tobacco: Not on file     Comment: Quit x 7 years  . Alcohol Use: 0.0 oz/week    0 Standard drinks or equivalent per week     Comment: occasionally    Review  of Systems  Constitutional: Negative for fever and chills.  Musculoskeletal: Positive for arthralgias.  Psychiatric/Behavioral: The patient is nervous/anxious.        Depression  All other systems reviewed and are negative.     Allergies  Opana and Tramadol  Home Medications   Prior to Admission medications   Medication Sig Start Date End Date Taking? Authorizing Provider  albuterol (PROVENTIL HFA) 108 (90 BASE) MCG/ACT inhaler Inhale 2 puffs into the lungs every 4 (four) hours as needed for wheezing.     Historical Provider, MD  amLODipine (NORVASC) 10 MG tablet Take 10 mg by mouth daily.     Historical Provider, MD  budesonide-formoterol (SYMBICORT) 160-4.5 MCG/ACT inhaler Inhale 2 puffs into the lungs daily as needed (Shortness of Breath).     Historical Provider, MD  Cholecalciferol (VITAMIN D PO) Take by mouth.    Historical Provider, MD    clopidogrel (PLAVIX) 75 MG tablet Take 1 tablet (75 mg total) by mouth daily. 07/24/14   Pixie Casino, MD  Cyanocobalamin (VITAMIN B 12 PO) Take by mouth.    Historical Provider, MD  insulin glargine (LANTUS) 100 UNIT/ML injection Inject 35 Units into the skin at bedtime.     Historical Provider, MD  iron polysaccharides (NIFEREX) 150 MG capsule Take 150 mg by mouth daily.    Historical Provider, MD  loratadine (CLARITIN) 10 MG tablet Take 10 mg by mouth as needed for allergies.     Historical Provider, MD  mometasone (NASONEX) 50 MCG/ACT nasal spray Place 2 sprays into the nose daily.     Historical Provider, MD  pantoprazole (PROTONIX) 40 MG tablet Take 40 mg by mouth daily.    Historical Provider, MD  polyethylene glycol-electrolytes (NULYTELY/GOLYTELY) 420 G solution Take 4,000 mLs by mouth once. 12/01/14   Mahala Menghini, PA-C  simvastatin (ZOCOR) 20 MG tablet Take 20 mg by mouth every morning.     Historical Provider, MD  TRADJENTA 5 MG TABS tablet Take 1 tablet by mouth daily. 02/05/14   Historical Provider, MD  valsartan (DIOVAN) 40 MG tablet Take 1 tablet (40 mg total) by mouth daily. 07/24/14   Pixie Casino, MD   BP 157/80 mmHg  Pulse 86  Temp(Src) 98.7 F (37.1 C) (Oral)  Resp 18  Ht 5\' 4"  (1.626 m)  Wt 241 lb (109.317 kg)  BMI 41.35 kg/m2  SpO2 100% Physical Exam  Musculoskeletal:       Right foot: There is decreased range of motion and tenderness. There is no deformity.       Feet:    ED Course  Procedures (including critical care time) Labs Review Labs Reviewed - No data to display  Imaging Review No results found.   EKG Interpretation None      MDM  The vital signs are well within normal limits. Pulse oximetry 100% on room air. Within normal limits by my interpretation. The x-ray is negative for fracture or dislocation, or deterioration of bone. Doubt diabetic foot. There is noted multiple areas of degenerative joint disease, and enthesophyte at the  Achilles tendon area.  Suspect the patient has an Achilles tendinitis. Patient is fitted with an ankle stirrup splint. Prescription for Norco given. The patient is to call Dr. Legrand Rams on tomorrow to set up appointment for pain management, as well as possible podiatry evaluation for injection at the Achilles tendon site.    Final diagnoses:  None    **I have reviewed nursing notes, vital signs, and all  appropriate lab and imaging results for this patient.Lily Kocher, PA-C 12/03/14 [id number]  Noemi Chapel, MD 12/04/14 [phone]

## 2014-12-03 NOTE — ED Notes (Addendum)
Patient complaining of pain below lateral right ankle. Denies injury.

## 2014-12-04 ENCOUNTER — Encounter: Payer: Self-pay | Admitting: Gastroenterology

## 2014-12-04 NOTE — Assessment & Plan Note (Signed)
Chronic renal disease, stage III with stable hemoglobin. Has not required Epogen. Intermittently has been on oral iron supplements. Labs indicate declining iron, iron saturations, ferritin consistent with evolving iron deficiency anemia. Last colonoscopy 6 years ago, remote EGD. Patient is on Plavix daily. Recommend colonoscopy and EGD for further evaluation.  I have discussed the risks, alternatives, benefits with regards to but not limited to the risk of reaction to medication, bleeding, infection, perforation and the patient is agreeable to proceed. Written consent to be obtained.  Await findings, may need abdominal ultrasound to follow up on CT liver findings.

## 2014-12-04 NOTE — Assessment & Plan Note (Signed)
Typical heartburn well-controlled on pantoprazole 40 mg daily. Continue anti-reflex measures.

## 2014-12-04 NOTE — Assessment & Plan Note (Signed)
Noted on noncontrast CT last year, questionable hepatic cyst versus hamartomas. Await EGD and colonoscopy findings. We'll likely offer patient abdominal ultrasound for follow-up.

## 2014-12-07 ENCOUNTER — Ambulatory Visit: Payer: Medicare Other | Admitting: Podiatry

## 2014-12-10 NOTE — Progress Notes (Signed)
CC'ED TO PCP 

## 2014-12-23 ENCOUNTER — Telehealth: Payer: Self-pay | Admitting: *Deleted

## 2014-12-23 NOTE — Telephone Encounter (Signed)
Faxed cleared for right side SI jointing - OK for patient to hold plavix 5-7 days prior to procedure

## 2014-12-25 ENCOUNTER — Encounter (HOSPITAL_COMMUNITY)
Admission: RE | Disposition: A | Discharge status: Home / Self Care | Payer: Self-pay | Source: Ambulatory Visit | Attending: Gastroenterology

## 2014-12-25 ENCOUNTER — Ambulatory Visit (HOSPITAL_COMMUNITY)
Admission: RE | Admit: 2014-12-25 | Discharge: 2014-12-25 | Disposition: A | Discharge status: Home / Self Care | Payer: Medicare Other | Source: Ambulatory Visit | Attending: Gastroenterology | Admitting: Gastroenterology

## 2014-12-25 ENCOUNTER — Encounter (HOSPITAL_COMMUNITY): Payer: Self-pay | Admitting: *Deleted

## 2014-12-25 DIAGNOSIS — Z7951 Long term (current) use of inhaled steroids: Secondary | ICD-10-CM | POA: Diagnosis not present

## 2014-12-25 DIAGNOSIS — J449 Chronic obstructive pulmonary disease, unspecified: Secondary | ICD-10-CM | POA: Diagnosis not present

## 2014-12-25 DIAGNOSIS — K2980 Duodenitis without bleeding: Secondary | ICD-10-CM | POA: Diagnosis not present

## 2014-12-25 DIAGNOSIS — I10 Essential (primary) hypertension: Secondary | ICD-10-CM | POA: Diagnosis not present

## 2014-12-25 DIAGNOSIS — K317 Polyp of stomach and duodenum: Secondary | ICD-10-CM

## 2014-12-25 DIAGNOSIS — D125 Benign neoplasm of sigmoid colon: Secondary | ICD-10-CM | POA: Insufficient documentation

## 2014-12-25 DIAGNOSIS — K621 Rectal polyp: Secondary | ICD-10-CM

## 2014-12-25 DIAGNOSIS — K2970 Gastritis, unspecified, without bleeding: Secondary | ICD-10-CM | POA: Diagnosis not present

## 2014-12-25 DIAGNOSIS — E669 Obesity, unspecified: Secondary | ICD-10-CM | POA: Diagnosis not present

## 2014-12-25 DIAGNOSIS — K635 Polyp of colon: Secondary | ICD-10-CM | POA: Diagnosis not present

## 2014-12-25 DIAGNOSIS — K219 Gastro-esophageal reflux disease without esophagitis: Secondary | ICD-10-CM

## 2014-12-25 DIAGNOSIS — F419 Anxiety disorder, unspecified: Secondary | ICD-10-CM | POA: Diagnosis not present

## 2014-12-25 DIAGNOSIS — R932 Abnormal findings on diagnostic imaging of liver and biliary tract: Secondary | ICD-10-CM

## 2014-12-25 DIAGNOSIS — Z6841 Body Mass Index (BMI) 40.0 and over, adult: Secondary | ICD-10-CM | POA: Diagnosis not present

## 2014-12-25 DIAGNOSIS — D509 Iron deficiency anemia, unspecified: Secondary | ICD-10-CM | POA: Diagnosis not present

## 2014-12-25 DIAGNOSIS — Z79899 Other long term (current) drug therapy: Secondary | ICD-10-CM | POA: Diagnosis not present

## 2014-12-25 DIAGNOSIS — Z87891 Personal history of nicotine dependence: Secondary | ICD-10-CM | POA: Diagnosis not present

## 2014-12-25 DIAGNOSIS — K2950 Unspecified chronic gastritis without bleeding: Secondary | ICD-10-CM | POA: Diagnosis not present

## 2014-12-25 DIAGNOSIS — D649 Anemia, unspecified: Secondary | ICD-10-CM | POA: Diagnosis present

## 2014-12-25 DIAGNOSIS — E119 Type 2 diabetes mellitus without complications: Secondary | ICD-10-CM | POA: Diagnosis not present

## 2014-12-25 DIAGNOSIS — E785 Hyperlipidemia, unspecified: Secondary | ICD-10-CM | POA: Insufficient documentation

## 2014-12-25 DIAGNOSIS — Z794 Long term (current) use of insulin: Secondary | ICD-10-CM | POA: Diagnosis not present

## 2014-12-25 HISTORY — PX: COLONOSCOPY: SHX5424

## 2014-12-25 HISTORY — PX: ESOPHAGOGASTRODUODENOSCOPY: SHX5428

## 2014-12-25 LAB — CBC
HCT: 35.9 % — ABNORMAL LOW (ref 39.0–52.0)
HEMOGLOBIN: 11.6 g/dL — AB (ref 13.0–17.0)
MCH: 26.8 pg (ref 26.0–34.0)
MCHC: 32.3 g/dL (ref 30.0–36.0)
MCV: 82.9 fL (ref 78.0–100.0)
Platelets: 133 10*3/uL — ABNORMAL LOW (ref 150–400)
RBC: 4.33 MIL/uL (ref 4.22–5.81)
RDW: 14.1 % (ref 11.5–15.5)
WBC: 3.5 10*3/uL — ABNORMAL LOW (ref 4.0–10.5)

## 2014-12-25 LAB — GLUCOSE, CAPILLARY: Glucose-Capillary: 89 mg/dL (ref 65–99)

## 2014-12-25 LAB — FERRITIN: FERRITIN: 44 ng/mL (ref 24–336)

## 2014-12-25 SURGERY — COLONOSCOPY
Anesthesia: Moderate Sedation

## 2014-12-25 MED ORDER — STERILE WATER FOR IRRIGATION IR SOLN
Status: DC | PRN
  Administered 2014-12-25: 09:00:00

## 2014-12-25 MED ORDER — LIDOCAINE VISCOUS 2 % MT SOLN
OROMUCOSAL | Status: AC
  Filled 2014-12-25: qty 15

## 2014-12-25 MED ORDER — FENTANYL CITRATE (PF) 100 MCG/2ML IJ SOLN
INTRAMUSCULAR | Status: AC
  Filled 2014-12-25: qty 4

## 2014-12-25 MED ORDER — SPOT INK MARKER SYRINGE KIT
PACK | SUBMUCOSAL | Status: DC | PRN
  Administered 2014-12-25: 3 mL via SUBMUCOSAL

## 2014-12-25 MED ORDER — FENTANYL CITRATE (PF) 100 MCG/2ML IJ SOLN
INTRAMUSCULAR | Status: DC | PRN
  Administered 2014-12-25 (×6): 25 ug via INTRAVENOUS

## 2014-12-25 MED ORDER — MIDAZOLAM HCL 5 MG/5ML IJ SOLN
INTRAMUSCULAR | Status: AC
  Filled 2014-12-25: qty 10

## 2014-12-25 MED ORDER — MIDAZOLAM HCL 5 MG/5ML IJ SOLN
INTRAMUSCULAR | Status: DC | PRN
Start: 2014-12-25 — End: 2014-12-25
  Administered 2014-12-25 (×3): 2 mg via INTRAVENOUS
  Administered 2014-12-25: 1 mg via INTRAVENOUS
  Administered 2014-12-25: 2 mg via INTRAVENOUS

## 2014-12-25 MED ORDER — MEPERIDINE HCL 100 MG/ML IJ SOLN
INTRAMUSCULAR | Status: DC
Start: 2014-12-25 — End: 2014-12-25
  Filled 2014-12-25: qty 2

## 2014-12-25 MED ORDER — SODIUM CHLORIDE 0.9 % IV SOLN
INTRAVENOUS | Status: DC
  Administered 2014-12-25: 09:00:00 via INTRAVENOUS

## 2014-12-25 NOTE — H&P (Signed)
Primary Care Physician:  [name], MD Primary Gastroenterologist:  Dr. Oneida Alar  Pre-Procedure History & Physical: HPI:  [name] SPIELBERGER is a 64 y.o. male here for Anemia.   Past Medical History  Diagnosis Date  . COPD (chronic obstructive pulmonary disease)   . Sinusitis   . PUD (peptic ulcer disease)     remote, reports f/u EGD about 8 years ago unremarkable   . Diabetes   . GERD (gastroesophageal reflux disease)   . Hyperlipidemia   . Hypertension   . Sleep apnea   . Post traumatic stress disorder   . Anxiety   . Depression   . Bell palsy   . Obesity     [place]  . Heart disease     family history  . Hepatomegaly     noted on noncontrast CT 2015  . Vitamin D deficiency     Past Surgical History  Procedure Laterality Date  . Asad lt shoulder  12/2008    left shoulder  . Umbilical hernia repair  2007    [place]  . Knee arthroscopy  10/2007    left  . Toenail excision      removed x2-bilateral  . Eye surgery  12/22/2010    tear duct probing-[place]  . Foreign body removal  03/29/2011    Procedure: REMOVAL FOREIGN BODY EXTREMITY;  Surgeon: Arther Abbott, MD;  Location: [facility];  Service: Orthopedics;  Laterality: Right;  Removal Foreign Body Right Thumb  . Quadriceps tendon repair  07/21/2011    Procedure: REPAIR QUADRICEP TENDON;  Surgeon: Arther Abbott, MD;  Location: [facility];  Service: Orthopedics;  Laterality: Right;  . Nm myoview ltd    . Doppler echocardiography    . Left heart catheterization with coronary angiogram N/A 03/28/2013    Procedure: LEFT HEART CATHETERIZATION WITH CORONARY ANGIOGRAM;  Surgeon: Leonie Man, MD;  Location: [facility] CATH LAB;  Service: Cardiovascular;  Laterality: N/A;  . Colonoscopy  10/2008    Fields: Rectal polyp obliterated, not retrieved, hemorrhoids, single ascending colon diverticulum near the CV. Next colonoscopy April 2020    Prior to Admission medications   Medication Sig Start Date End Date Taking? Authorizing Provider   albuterol (PROVENTIL HFA) 108 (90 BASE) MCG/ACT inhaler Inhale 2 puffs into the lungs every 4 (four) hours as needed for wheezing.    Yes Historical Provider, MD  amLODipine (NORVASC) 10 MG tablet Take 10 mg by mouth daily.    Yes Historical Provider, MD  budesonide-formoterol (SYMBICORT) 160-4.5 MCG/ACT inhaler Inhale 2 puffs into the lungs daily as needed (Shortness of Breath).    Yes Historical Provider, MD  Cholecalciferol (VITAMIN D PO) Take by mouth.   Yes Historical Provider, MD  clopidogrel (PLAVIX) 75 MG tablet Take 1 tablet (75 mg total) by mouth daily. 07/24/14  Yes Pixie Casino, MD  Cyanocobalamin (VITAMIN B 12 PO) Take by mouth.   Yes Historical Provider, MD  insulin glargine (LANTUS) 100 UNIT/ML injection Inject 35 Units into the skin at bedtime.    Yes Historical Provider, MD  iron polysaccharides (NIFEREX) 150 MG capsule Take 150 mg by mouth daily.   Yes Historical Provider, MD  loratadine (CLARITIN) 10 MG tablet Take 10 mg by mouth as needed for allergies.    Yes Historical Provider, MD  mometasone (NASONEX) 50 MCG/ACT nasal spray Place 2 sprays into the nose daily.    Yes Historical Provider, MD  pantoprazole (PROTONIX) 40 MG tablet Take 40 mg by mouth daily.   Yes Historical  Provider, MD  polyethylene glycol-electrolytes (NULYTELY/GOLYTELY) 420 G solution Take 4,000 mLs by mouth once. 12/01/14  Yes Mahala Menghini, PA-C  simvastatin (ZOCOR) 20 MG tablet Take 20 mg by mouth every morning.    Yes Historical Provider, MD  TRADJENTA 5 MG TABS tablet Take 1 tablet by mouth daily. 02/05/14  Yes Historical Provider, MD  valsartan (DIOVAN) 40 MG tablet Take 1 tablet (40 mg total) by mouth daily. 07/24/14  Yes Pixie Casino, MD  HYDROcodone-acetaminophen (NORCO/VICODIN) 5-325 MG per tablet Take 1-2 tablets by mouth every 4 (four) hours as needed for moderate pain. Patient not taking: Reported on 12/15/2014 12/03/14   Lily Kocher, PA-C    Allergies as of 12/01/2014 - Review Complete  12/01/2014  Allergen Reaction Noted  . Opana [oxymorphone hcl] Itching 01/15/2014  . Tramadol Itching 07/10/2011    Family History  Problem Relation Age of Onset  . Hypertension Mother     MI  . Cancer Mother     breast   . Diabetes Mother   . Diabetes Father   . Hypertension Father   . Hypertension Sister   . Diabetes Sister   . Arthritis    . Asthma    . Lung disease    . Anesthesia problems Neg Hx   . Hypotension Neg Hx   . Malignant hyperthermia Neg Hx   . Pseudochol deficiency Neg Hx   . Colon cancer Neg Hx     History   Social History  . Marital Status: Married    Spouse Name: N/A  . Number of Children: 2  . Years of Education: 12th grade   Occupational History  . disabled   .     [employer]     Social History Main Topics  . Smoking status: Former Smoker -- 1.00 packs/day for 25 years    Types: Cigarettes    Quit date: 03/27/2010  . Smokeless tobacco: Not on file     Comment: Quit x 7 years  . Alcohol Use: 0.0 oz/week    0 Standard drinks or equivalent per week     Comment: occasionally  . Drug Use: No     Comment: stopped feb2011  . Sexual Activity: Not on file   Other Topics Concern  . Not on file   Social History Narrative   He quit smoking in 2010. 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 He developed pulmonary problems, became disabled because of lower airway disease in 2009.     Review of Systems: See HPI, otherwise negative ROS   Physical Exam: BP 169/101 mmHg  Pulse 72  Temp(Src) 98.1 F (36.7 C) (Oral)  Resp 18  Ht 5\' 4"  (1.626 m)  Wt 239 lb (108.41 kg)  BMI 41.00 kg/m2  SpO2 96% General:   Alert,  pleasant and cooperative in NAD Head:  Normocephalic and atraumatic. Neck:  Supple; Lungs:  Clear throughout to auscultation.    Heart:  Regular rate and rhythm. Abdomen:  Soft, nontender and nondistended. Normal bowel sounds, without guarding, and without rebound.   Neurologic:  Alert and  oriented  x4;  grossly normal neurologically.  Impression/Plan:   Anemia  PLAN: EGD/TCS TODAY

## 2014-12-25 NOTE — Discharge Instructions (Signed)
You had 2 polyps removed FROM YOU R COLON AND RECTUM. You have internal hemorrhoids & DIVERTICULOSIS IN YOUR RIGHT COLON. You have MODERATE gastritis, & MILD duodenitis. I REMOVED 2 STOMACH POLYPS. I PLACED A CLIP AT THE BASE OF THE LARGER POLYP TO PREVENT BLEEDING.  I biopsied your stomach.    NO MRI FOR 30 DAYS DUE TO METAL CLIP PLACEMENT IN THE STOMACH.  HOLD IRON. RE-START JUN 18.  FOLLOW A HIGH FIBER/DIABETIC DIET. AVOID ITEMS THAT CAUSE BLOATING. SEE INFO BELOW.  YOUR BODY MASS INDEX IS OVER 40, WHICH MEANS YOU ARE MORBIDLY OBESE. CONTINUE YOUR WEIGHT LOSS EFFORTS. LOSE 20 LBS.  AVOID ITEMS THAT TRIGGER GASTRITIS. SEE INFO BELOW.  YOUR BIOPSY RESULTS WILL BE AVAILABLE IN MY CHART AFTER JUN 15 AND MY OFFICE WILL CONTACT YOU IN 10-14 DAYS WITH YOUR RESULTS.   Next colonoscopy in 5-10 years.   FOLLOW UP IN 3 MOS.   ENDOSCOPY Care After Read the instructions outlined below and refer to this sheet in the next week. These discharge instructions provide you with general information on caring for yourself after you leave the hospital. While your treatment has been planned according to the most current medical practices available, unavoidable complications occasionally occur. If you have any problems or questions after discharge, call DR. Raguel Kosloski, [phone].  ACTIVITY  You may resume your regular activity, but move at a slower pace for the next 24 hours.   Take frequent rest periods for the next 24 hours.   Walking will help get rid of the air and reduce the bloated feeling in your belly (abdomen).   No driving for 24 hours (because of the medicine (anesthesia) used during the test).   You may shower.   Do not sign any important legal documents or operate any machinery for 24 hours (because of the anesthesia used during the test).    NUTRITION  Drink plenty of fluids.   You may resume your normal diet as instructed by your doctor.   Begin with a light meal and progress to  your normal diet. Heavy or fried foods are harder to digest and may make you feel sick to your stomach (nauseated).   Avoid alcoholic beverages for 24 hours or as instructed.    MEDICATIONS  You may resume your normal medications.   WHAT YOU CAN EXPECT TODAY  Some feelings of bloating in the abdomen.   Passage of more gas than usual.   Spotting of blood in your stool or on the toilet paper  .  IF YOU HAD POLYPS REMOVED DURING THE ENDOSCOPY:  Eat a soft diet IF YOU HAVE NAUSEA, BLOATING, ABDOMINAL PAIN, OR VOMITING.    FINDING OUT THE RESULTS OF YOUR TEST Not all test results are available during your visit. DR. Oneida Alar WILL CALL YOU WITHIN 14 DAYS OF YOUR PROCEDUE WITH YOUR RESULTS. Do not assume everything is normal if you have not heard from DR. Tyna Huertas, CALL HER OFFICE AT [phone].  SEEK IMMEDIATE MEDICAL ATTENTION AND CALL THE OFFICE: [phone] IF:  You have more than a spotting of blood in your stool.   Your belly is swollen (abdominal distention).   You are nauseated or vomiting.   You have a temperature over 101F.   You have abdominal pain or discomfort that is severe or gets worse throughout the day.   Gastritis/DUODENITIS  Gastritis/DUODENITIS is inflammation (the body's way of reacting to injury and/or infection) of the stomach/SMALLBOWEL. It is often caused by viral or bacterial (germ)  infections. It can also be caused BY ASPIRIN, BC/GOODY POWDER'S, (IBUPROFEN) MOTRIN, OR ALEVE (NAPROXEN), chemicals (including alcohol), SPICY FOODS, and medications. This illness may be associated with generalized malaise (feeling tired, not well), UPPER ABDOMINAL STOMACH cramps, and fever. One common bacterial cause of gastritis is an organism known as H. Pylori. This can be treated with antibiotics.    High-Fiber Diet A high-fiber diet changes your normal diet to include more whole grains, legumes, fruits, and vegetables. Changes in the diet involve replacing refined  carbohydrates with unrefined foods. The calorie level of the diet is essentially unchanged. The Dietary Reference Intake (recommended amount) for adult males is 38 grams per day. For adult females, it is 25 grams per day. Pregnant and lactating women should consume 28 grams of fiber per day. Fiber is the intact part of a plant that is not broken down during digestion. Functional fiber is fiber that has been isolated from the plant to provide a beneficial effect in the body. PURPOSE  Increase stool bulk.   Ease and regulate bowel movements.   Lower cholesterol.   REDUCE RISK OF COLON CANCER  INDICATIONS THAT YOU NEED MORE FIBER  Constipation and hemorrhoids.   Uncomplicated diverticulosis (intestine condition) and irritable bowel syndrome.   Weight management.   As a protective measure against hardening of the arteries (atherosclerosis), diabetes, and cancer.   GUIDELINES FOR INCREASING FIBER IN THE DIET  Start adding fiber to the diet slowly. A gradual increase of about 5 more grams (2 slices of whole-wheat bread, 2 servings of most fruits or vegetables, or 1 bowl of high-fiber cereal) per day is best. Too rapid an increase in fiber may result in constipation, flatulence, and bloating.   Drink enough water and fluids to keep your urine clear or pale yellow. Water, juice, or caffeine-free drinks are recommended. Not drinking enough fluid may cause constipation.   Eat a variety of high-fiber foods rather than one type of fiber.   Try to increase your intake of fiber through using high-fiber foods rather than fiber pills or supplements that contain small amounts of fiber.   The goal is to change the types of food eaten. Do not supplement your present diet with high-fiber foods, but replace foods in your present diet.   INCLUDE A VARIETY OF FIBER SOURCES  Replace refined and processed grains with whole grains, canned fruits with fresh fruits, and incorporate other fiber sources. White  rice, white breads, and most bakery goods contain little or no fiber.   Brown whole-grain rice, buckwheat oats, and many fruits and vegetables are all good sources of fiber. These include: broccoli, Brussels sprouts, cabbage, cauliflower, beets, sweet potatoes, white potatoes (skin on), carrots, tomatoes, eggplant, squash, berries, fresh fruits, and dried fruits.   Cereals appear to be the richest source of fiber. Cereal fiber is found in whole grains and bran. Bran is the fiber-rich outer coat of cereal grain, which is largely removed in refining. In whole-grain cereals, the bran remains. In breakfast cereals, the largest amount of fiber is found in those with "bran" in their names. The fiber content is sometimes indicated on the label.   You may need to include additional fruits and vegetables each day.   In baking, for 1 cup white flour, you may use the following substitutions:   1 cup whole-wheat flour minus 2 tablespoons.   1/2 cup white flour plus 1/2 cup whole-wheat flour.   Diverticulosis Diverticulosis is a common condition that develops when small pouches (  diverticula) form in the wall of the colon. The risk of diverticulosis increases with age. It happens more often in people who eat a low-fiber diet. Most individuals with diverticulosis have no symptoms. Those individuals with symptoms usually experience belly (abdominal) pain, constipation, or loose stools (diarrhea).  HOME CARE INSTRUCTIONS  Increase the amount of fiber in your diet as directed by your caregiver or dietician. This may reduce symptoms of diverticulosis.   Drink at least 6 to 8 glasses of water each day to prevent constipation.   Try not to strain when you have a bowel movement.   Avoiding nuts and seeds to prevent complications is NOT NECESSARY.  FOODS HAVING HIGH FIBER CONTENT INCLUDE:  Fruits. Apple, peach, pear, tangerine, raisins, prunes.   Vegetables. Brussels sprouts, asparagus, broccoli, cabbage,  carrot, cauliflower, romaine lettuce, spinach, summer squash, tomato, winter squash, zucchini.   Starchy Vegetables. Baked beans, kidney beans, lima beans, split peas, lentils, potatoes (with skin).   Grains. Whole wheat bread, brown rice, bran flake cereal, plain oatmeal, white rice, shredded wheat, bran muffins.   Polyps, Colon  A polyp is extra tissue that grows inside your body. Colon polyps grow in the large intestine. The large intestine, also called the colon, is part of your digestive system. It is a long, hollow tube at the end of your digestive tract where your body makes and stores stool. Most polyps are not dangerous. They are benign. This means they are not cancerous. But over time, some types of polyps can turn into cancer. Polyps that are smaller than a pea are usually not harmful. But larger polyps could someday become or may already be cancerous. To be safe, doctors remove all polyps and test them.   PREVENTION There is not one sure way to prevent polyps. You might be able to lower your risk of getting them if you:  Eat more fruits and vegetables and less fatty food.   Do not smoke.   Avoid alcohol.   Exercise every day.   Lose weight if you are overweight.   Eating more calcium and folate can also lower your risk of getting polyps. Some foods that are rich in calcium are milk, cheese, and broccoli. Some foods that are rich in folate are chickpeas, kidney beans, and spinach.

## 2014-12-25 NOTE — Progress Notes (Signed)
Note given to post-op nurse to give to patient about no MRI's until gastric clip has passed.

## 2014-12-25 NOTE — Op Note (Signed)
[place], [address]   ENDOSCOPY PROCEDURE REPORT  PATIENT: [name], [name]  MR#: [id number] BIRTHDATE: 11/14/1950 , 22  yrs. old GENDER: male  ENDOSCOPIST: Danie Binder, MD REFERRED [id number] [name], M.D. PROCEDURE DATE: December 28, 2014 PROCEDURE:   EGD w/ biopsy INDICATIONS:anemia. NO BRBPR OR MELENA MEDICATIONS: TCS + Fentanyl 25 mcg IV and Versed 2 mg IV TOPICAL ANESTHETIC:   Viscous Xylocaine ASA CLASS:  DESCRIPTION OF PROCEDURE:     Physical exam was performed.  Informed consent was obtained from the patient after explaining the benefits, risks, and alternatives to the procedure.  The patient was connected to the monitor and placed in the left lateral position.  Continuous oxygen was provided by nasal cannula and IV medicine administered through an indwelling cannula.  After administration of sedation, the patients esophagus was intubated and the [id number] [id number]  endoscope was advanced under direct visualization to the second portion of the duodenum.  The scope was removed slowly by carefully examining the color, texture, anatomy, and integrity of the mucosa on the way out.  The patient was recovered in endoscopy and discharged home in satisfactory condition.   ESOPHAGUS: The mucosa of the esophagus appeared normal.   STOMACH: Moderate non-erosive gastritis (inflammation) was found in the gastric antrum.  Multiple biopsies were performed using cold forceps.   Two sessile polyps measuring 1.2-1.5 cm in size with mucous caps were found on the greater curvature of the gastric body.  A polypectomy was performed with snare cautery.  The resection was incomplete and the polyp tissue was completely retrieved.  To prevent future bleeding a hemoclip(s) were placed on the site(s).   BOTYH SITES TATTOED WITH 1-2 CC SPOT. DUODENUM: Mild duodenal inflammation was found in the duodenal bulb.   The duodenal mucosa showed no abnormalities in  the 2nd part of the duodenum. COMPLICATIONS: There were no immediate complications.  ENDOSCOPIC IMPRESSION: 1.   ANEMIA MOST LIKELY DUE TO CRI, GASTRITIS, GASTRIC POLYPS 2.   MODERATE Non-erosive gastritis AND MILD DUODENITIS 3.   Two LARGE GASTRIC polyps REMOVED  RECOMMENDATIONS: NO MRI FOR 30 DAYS. HOLD IRON.  RE-START JUN 18.  OK TO TAKE PLAVIX. FOLLOW A HIGH FIBER/DIABETIC DIET. CONTINUE YOUR WEIGHT LOSS EFFORTS.  LOSE 20 LBS. AVOID ITEMS THAT TRIGGER GASTRITIS. AWAIT BIOPSY RESULTS. Next colonoscopy in 5-10 years WITH AN OVERTUBE/PHNEERGAN IN PREOP.  FOLLOW UP IN 3 MOS.  REPEAT EXAM: eSigned:  Danie Binder, MD 28-Dec-2014 12:29 PM  CPT CODES: ICD CODES:  The ICD and CPT codes recommended by this software are interpretations from the data that the clinical staff has captured with the software.  The verification of the translation of this report to the ICD and CPT codes and modifiers is the sole responsibility of the health care institution and practicing physician where this report was generated.  [employer] will not be held responsible for the validity of the ICD and CPT codes included on this report.  AMA assumes no liability for data contained or not contained herein. CPT is a Designer, television/film set of the [employer].

## 2014-12-25 NOTE — Op Note (Addendum)
[facility] [address] [place], [address]   COLONOSCOPY PROCEDURE REPORT  PATIENT: [name], [name]  MR#: [id number] BIRTHDATE: Oct 15, 1950 , 3  yrs. old GENDER: male ENDOSCOPIST: Danie Binder, MD REFERRED [id number] [name], M.D. PROCEDURE DATE:  12/29/2014 PROCEDURE:   Colonoscopy with cold biopsy polypectomy INDICATIONS:anemia, non-specific. MEDICATIONS: Fentanyl 125 mcg IV and Versed 7 mg IV  DESCRIPTION OF PROCEDURE:    Physical exam was performed.  Informed consent was obtained from the patient after explaining the benefits, risks, and alternatives to procedure.  The patient was connected to monitor and placed in left lateral position. Continuous oxygen was provided by nasal cannula and IV medicine administered through an indwelling cannula.  After administration of sedation and rectal exam, the patients rectum was intubated and the [id number]  colonoscope was advanced under direct visualization to the ileum.  The scope was removed slowly by carefully examining the color, texture, anatomy, and integrity mucosa on the way out.  The patient was recovered in endoscopy and discharged home in satisfactory condition.     COLON FINDINGS: Two sessile polyps ranging from 2 to 79mm in size were found in the rectum and sigmoid colon.  A polypectomy was performed with cold forceps.  , Small internal hemorrhoids were found.  , and The colon was redundant.  Manual abdominal counter-pressure was used to reach the cecum.  The patient was moved on to their back to reach the cecum.  PREP QUALITY: good.  CECAL W/D TIME: 18       minutes COMPLICATIONS: None  ENDOSCOPIC IMPRESSION: 1.   COLORECTAL POLYPS (2) REMOVED 2.   Small internal hemorrhoids 3.   The LEFT colon IS  SEVERELY REDUNDANT  RECOMMENDATIONS: NO MRI FOR 30 DAYS. HOLD IRON.  RE-START JUN 18. OK TO TAKE PLAVIX. FOLLOW A HIGH FIBER/DIABETIC DIET. CONTINUE YOUR WEIGHT LOSS EFFORTS.  LOSE 20  LBS. AVOID ITEMS THAT TRIGGER GASTRITIS. AWAIT BIOPSY RESULTS. Next colonoscopy in 5-10 years WITH AN OVERTUBE. FOLLOW UP IN 3 MOS.   eSigned:  Danie Binder, MD December 29, 2014 12:21 PM Revised: 12-29-14 12:21 PM  CPT CODES: ICD CODES:  The ICD and CPT codes recommended by this software are interpretations from the data that the clinical staff has captured with the software.  The verification of the translation of this report to the ICD and CPT codes and modifiers is the sole responsibility of the health care institution and practicing physician where this report was generated.  [employer] will not be held responsible for the validity of the ICD and CPT codes included on this report.  AMA assumes no liability for data contained or not contained herein. CPT is a Designer, television/film set of the [employer].

## 2014-12-25 NOTE — Progress Notes (Signed)
REVIEWED-NO ADDITIONAL RECOMMENDATIONS. 

## 2014-12-29 ENCOUNTER — Encounter (HOSPITAL_COMMUNITY): Payer: Self-pay | Admitting: Gastroenterology

## 2015-01-07 ENCOUNTER — Telehealth: Payer: Self-pay | Admitting: Gastroenterology

## 2015-01-07 NOTE — Telephone Encounter (Signed)
Please call pt. He had HYPERPLASTIC POLYPS removed from [place]. Please call pt. HER stomach Bx shows ATROPHIC gastritis. HIS IRON STORES ARE NORMAL. HE HAS A LOW BLOOD COUNT DUE TO CHRONIC DISEASES.   NO MRI UNTIL JUL 11 DUE TO METAL CLIP PLACEMENT IN THE STOMACH.  HE DOES NOT NEED ADDITIONAL GI WORKUP AT THIS TIME.  HIS LIVER IS BIG BECAUSE HE IS MORBIDLY OBESE.   CONTINUE YOUR WEIGHT LOSS EFFORTS. LOSE 20 LBS.  FOLLOW A HIGH FIBER/DIABETIC DIET. AVOID ITEMS THAT CAUSE BLOATING.   AVOID ITEMS THAT TRIGGER GASTRITIS.   FOLLOW UP IN 6 MOS E30 ANEMIA/MORBID OBESITY/GERD.  Next colonoscopy in 10 years.

## 2015-01-08 NOTE — Telephone Encounter (Signed)
Recalls made 

## 2015-01-08 NOTE — Telephone Encounter (Signed)
Pt is aware of results. 

## 2015-01-22 ENCOUNTER — Other Ambulatory Visit: Payer: Self-pay | Admitting: *Deleted

## 2015-01-22 MED ORDER — VALSARTAN 40 MG PO TABS: 40.0000 mg | ORAL_TABLET | Freq: Every day | ORAL | Status: DC

## 2015-01-22 MED ORDER — LINAGLIPTIN 5 MG PO TABS: 5.0000 mg | ORAL_TABLET | Freq: Every day | ORAL | Status: DC

## 2015-03-11 ENCOUNTER — Telehealth: Payer: Self-pay | Admitting: *Deleted

## 2015-03-11 NOTE — Telephone Encounter (Signed)
Faxed clearance to [facility] for patient to hold PLAVIX for 5-7 days prior to facet joint injection.

## 2015-03-17 ENCOUNTER — Encounter (HOSPITAL_COMMUNITY): Payer: Self-pay | Admitting: Emergency Medicine

## 2015-03-17 ENCOUNTER — Emergency Department (HOSPITAL_COMMUNITY)
Admission: EM | Admit: 2015-03-17 | Discharge: 2015-03-17 | Disposition: A | Discharge status: Home / Self Care | Payer: Medicare Other | Attending: Emergency Medicine | Admitting: Emergency Medicine

## 2015-03-17 DIAGNOSIS — M25561 Pain in right knee: Secondary | ICD-10-CM | POA: Diagnosis present

## 2015-03-17 DIAGNOSIS — Z7902 Long term (current) use of antithrombotics/antiplatelets: Secondary | ICD-10-CM | POA: Insufficient documentation

## 2015-03-17 DIAGNOSIS — Z79899 Other long term (current) drug therapy: Secondary | ICD-10-CM | POA: Insufficient documentation

## 2015-03-17 DIAGNOSIS — Z8711 Personal history of peptic ulcer disease: Secondary | ICD-10-CM | POA: Diagnosis not present

## 2015-03-17 DIAGNOSIS — J449 Chronic obstructive pulmonary disease, unspecified: Secondary | ICD-10-CM | POA: Diagnosis not present

## 2015-03-17 DIAGNOSIS — G8929 Other chronic pain: Secondary | ICD-10-CM | POA: Diagnosis not present

## 2015-03-17 DIAGNOSIS — E559 Vitamin D deficiency, unspecified: Secondary | ICD-10-CM | POA: Diagnosis not present

## 2015-03-17 DIAGNOSIS — Z794 Long term (current) use of insulin: Secondary | ICD-10-CM | POA: Diagnosis not present

## 2015-03-17 DIAGNOSIS — Z8659 Personal history of other mental and behavioral disorders: Secondary | ICD-10-CM | POA: Diagnosis not present

## 2015-03-17 DIAGNOSIS — E119 Type 2 diabetes mellitus without complications: Secondary | ICD-10-CM | POA: Insufficient documentation

## 2015-03-17 DIAGNOSIS — K219 Gastro-esophageal reflux disease without esophagitis: Secondary | ICD-10-CM | POA: Insufficient documentation

## 2015-03-17 DIAGNOSIS — Z7951 Long term (current) use of inhaled steroids: Secondary | ICD-10-CM | POA: Diagnosis not present

## 2015-03-17 DIAGNOSIS — Z87891 Personal history of nicotine dependence: Secondary | ICD-10-CM | POA: Insufficient documentation

## 2015-03-17 DIAGNOSIS — I519 Heart disease, unspecified: Secondary | ICD-10-CM | POA: Insufficient documentation

## 2015-03-17 DIAGNOSIS — E785 Hyperlipidemia, unspecified: Secondary | ICD-10-CM | POA: Diagnosis not present

## 2015-03-17 DIAGNOSIS — I10 Essential (primary) hypertension: Secondary | ICD-10-CM | POA: Diagnosis not present

## 2015-03-17 DIAGNOSIS — M25571 Pain in right ankle and joints of right foot: Secondary | ICD-10-CM | POA: Diagnosis not present

## 2015-03-17 DIAGNOSIS — E669 Obesity, unspecified: Secondary | ICD-10-CM | POA: Insufficient documentation

## 2015-03-17 HISTORY — DX: Pain in unspecified shoulder: M25.519

## 2015-03-17 HISTORY — DX: Other chronic pain: G89.29

## 2015-03-17 HISTORY — DX: Dorsalgia, unspecified: M54.9

## 2015-03-17 HISTORY — DX: Pain in unspecified knee: M25.569

## 2015-03-17 MED ORDER — HYDROCODONE-ACETAMINOPHEN 5-325 MG PO TABS: 1.0000 | ORAL_TABLET | ORAL | Status: DC | PRN

## 2015-03-17 MED ORDER — HYDROCODONE-ACETAMINOPHEN 5-325 MG PO TABS
1.0000 | ORAL_TABLET | Freq: Once | ORAL | Status: AC
  Administered 2015-03-17: 1 via ORAL
  Filled 2015-03-17: qty 1

## 2015-03-17 MED ORDER — DICLOFENAC SODIUM 50 MG PO TBEC: 50.0000 mg | DELAYED_RELEASE_TABLET | Freq: Two times a day (BID) | ORAL | Status: DC
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
## 2015-03-17 NOTE — ED Notes (Signed)
Pt refused ASO ankle brace. Pt states he has same brace at home that he will apply when he gets home.

## 2015-03-17 NOTE — ED Notes (Signed)
[name] [name] at bedside.
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
## 2015-03-24 ENCOUNTER — Encounter: Payer: Self-pay | Admitting: Gastroenterology

## 2015-03-24 ENCOUNTER — Ambulatory Visit (INDEPENDENT_AMBULATORY_CARE_PROVIDER_SITE_OTHER): Payer: Medicare Other | Admitting: Gastroenterology

## 2015-03-24 VITALS — BP 140/85 | HR 72 | Temp 98.4°F | Ht 64.0 in | Wt 238.8 lb

## 2015-03-24 DIAGNOSIS — R160 Hepatomegaly, not elsewhere classified: Secondary | ICD-10-CM | POA: Diagnosis not present

## 2015-03-24 DIAGNOSIS — K219 Gastro-esophageal reflux disease without esophagitis: Secondary | ICD-10-CM

## 2015-03-24 DIAGNOSIS — D649 Anemia, unspecified: Secondary | ICD-10-CM

## 2015-03-24 NOTE — Assessment & Plan Note (Signed)
Doing well on current regimen. Return to the office in 6 months to see Dr. Oneida Alar.

## 2015-03-24 NOTE — Progress Notes (Signed)
Primary Care Physician: [name], MD  Primary Gastroenterologist:  Barney Drain, MD   Chief Complaint  Patient presents with  . Follow-up    HPI: [name] [name] is a 64 y.o. male here for follow up of hepatomegaly (on CT), EGD/TCS (12/2014), anemia, GERD.   EGD showed atrophic gastritis, 2 large gastric polyps removed, Hemoclip placed at one site to prevent bleeding. Both polyps tattooed. There were hyperplastic. Colonoscopy with severely redundant left colon, small internal hemorrhoids, 2 polyps in the rectum and sigmoid colon removed which were hyperplastic. Next colonoscopy in 10 years with overtube.  Patient doing well. Heartburn well-controlled. No longer on diclofenac. Bowel movements regular. Denies blood in the stool or melena. Appetite good. No abdominal pain. Taking iron daily. Maintains pantoprazole 40 mg daily.    Current Outpatient Prescriptions  Medication Sig Dispense Refill  . albuterol (PROVENTIL HFA) 108 (90 BASE) MCG/ACT inhaler Inhale 2 puffs into the lungs every 4 (four) hours as needed for wheezing.     Marland Kitchen amLODipine (NORVASC) 10 MG tablet Take 10 mg by mouth daily.     . budesonide-formoterol (SYMBICORT) 160-4.5 MCG/ACT inhaler Inhale 2 puffs into the lungs daily as needed (Shortness of Breath).     . Cholecalciferol (VITAMIN D PO) Take by mouth.    . clopidogrel (PLAVIX) 75 MG tablet Take 1 tablet (75 mg total) by mouth daily. 30 tablet 10  . Cyanocobalamin (VITAMIN B 12 PO) Take by mouth.    . diclofenac (VOLTAREN) 50 MG EC tablet Take 1 tablet (50 mg total) by mouth 2 (two) times daily. 15 tablet 0  . HYDROcodone-acetaminophen (NORCO/VICODIN) 5-325 MG per tablet Take 1 tablet by mouth every 4 (four) hours as needed. 10 tablet 0  . insulin glargine (LANTUS) 100 UNIT/ML injection Inject 35 Units into the skin at bedtime.     . iron polysaccharides (NIFEREX) 150 MG capsule Take 150 mg by mouth daily.    Marland Kitchen linagliptin (TRADJENTA) 5 MG TABS tablet Take  1 tablet (5 mg total) by mouth daily. 30 tablet 5  . loratadine (CLARITIN) 10 MG tablet Take 10 mg by mouth as needed for allergies.     . mometasone (NASONEX) 50 MCG/ACT nasal spray Place 2 sprays into the nose daily.     . pantoprazole (PROTONIX) 40 MG tablet Take 40 mg by mouth daily.    . simvastatin (ZOCOR) 20 MG tablet Take 20 mg by mouth every morning.     . valsartan (DIOVAN) 40 MG tablet Take 1 tablet (40 mg total) by mouth daily. 30 tablet 5   No current facility-administered medications for this visit.    Allergies as of 03/24/2015 - Review Complete 03/17/2015  Allergen Reaction Noted  . Opana [oxymorphone hcl] Itching 01/15/2014  . Tramadol Itching 07/10/2011    ROS:  General: Negative for anorexia, weight loss, fever, chills, fatigue, weakness. ENT: Negative for hoarseness, difficulty swallowing , nasal congestion. CV: Negative for chest pain, angina, palpitations, dyspnea on exertion, peripheral edema.  Respiratory: Negative for dyspnea at rest, dyspnea on exertion, cough, sputum, wheezing.  GI: See history of present illness. GU:  Negative for dysuria, hematuria, urinary incontinence, urinary frequency, nocturnal urination.  Endo: Negative for unusual weight change.    Physical Examination:   There were no vitals taken for this visit.  General: Well-nourished, well-developed in no acute distress.  Eyes: No icterus. Mouth: Oropharyngeal mucosa moist and pink , no lesions erythema or exudate. Lungs: Clear to auscultation bilaterally.  Heart: Regular rate and rhythm, no murmurs rubs or gallops.  Abdomen: Bowel sounds are normal, nontender, nondistended, no hepatosplenomegaly or masses, no abdominal bruits or hernia , no rebound or guarding.   Extremities: No lower extremity edema. No clubbing or deformities. Neuro: Alert and oriented x 4   Skin: Warm and dry, no jaundice.   Psych: Alert and cooperative, normal mood and affect.  Labs:  Lab Results  Component  Value Date   WBC 3.5* 12/25/2014   HGB 11.6* 12/25/2014   HCT 35.9* 12/25/2014   MCV 82.9 12/25/2014   PLT 133* 12/25/2014   Lab Results  Component Value Date   FERRITIN 44 12/25/2014   Labs from April 2016 Iron 72, TIBC 371, saturations 19%, ferritin 37, creatinine 2.65, B12 543, folate 8.3.   Imaging Studies: No results found.

## 2015-03-24 NOTE — Assessment & Plan Note (Signed)
Anemia, stable. Likely due to chronic renal insufficiency, gastric polyps, gastritis. Retrieve recent labs from Dr. Hinda Lenis and Dr. Dorris Fetch for review. Continue iron. Return to office in six months to see Dr. Oneida Alar.

## 2015-03-24 NOTE — Patient Instructions (Signed)
1. Continue pantoprazole as before. 2. I will retrieve copy of your most recent labs for review. We will let you know if you need any further lab work. 3. Office visit in 6 months with Dr. Oneida Alar.

## 2015-03-24 NOTE — Progress Notes (Signed)
cc'ed to pcp °

## 2015-03-24 NOTE — Assessment & Plan Note (Addendum)
Noted on noncontrast CT last year. In the setting of obesity. Recommend exercise and weight loss. Keep a check on LFTs at least every 6 months. Avoid alcohol consumption. Return to the office to see Dr. Oneida Alar in 6 months.  Instructions for fatty liver: Recommend 1-2# weight loss per week until ideal body weight through exercise & diet. Low fat/cholesterol diet.   Avoid sweets, sodas, fruit juices, sweetened beverages like tea, etc. Gradually increase exercise from 15 min daily up to 1 hr per day 5 days/week. Avoid alcohol use.

## 2015-03-25 ENCOUNTER — Ambulatory Visit (INDEPENDENT_AMBULATORY_CARE_PROVIDER_SITE_OTHER): Payer: Medicare Other

## 2015-03-25 ENCOUNTER — Encounter: Payer: Self-pay | Admitting: Orthopedic Surgery

## 2015-03-25 ENCOUNTER — Ambulatory Visit (INDEPENDENT_AMBULATORY_CARE_PROVIDER_SITE_OTHER): Payer: Medicare Other | Admitting: Orthopedic Surgery

## 2015-03-25 VITALS — BP 137/89 | Ht 64.0 in | Wt 238.8 lb

## 2015-03-25 DIAGNOSIS — S83521A Sprain of posterior cruciate ligament of right knee, initial encounter: Secondary | ICD-10-CM | POA: Diagnosis not present

## 2015-03-25 DIAGNOSIS — M25561 Pain in right knee: Secondary | ICD-10-CM

## 2015-03-25 NOTE — Addendum Note (Signed)
Addended by: Baldomero Lamy B on: 03/25/2015 05:20 PM   Modules accepted: Orders
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
## 2015-03-25 NOTE — Patient Instructions (Signed)
We will schedule MRI and call you with appt and results

## 2015-04-13 ENCOUNTER — Other Ambulatory Visit: Payer: Self-pay

## 2015-04-22 ENCOUNTER — Ambulatory Visit
Admission: RE | Admit: 2015-04-22 | Discharge: 2015-04-22 | Disposition: A | Discharge status: Home / Self Care | Payer: Medicare Other | Source: Ambulatory Visit | Attending: Orthopedic Surgery | Admitting: Orthopedic Surgery

## 2015-04-22 DIAGNOSIS — S83521A Sprain of posterior cruciate ligament of right knee, initial encounter: Secondary | ICD-10-CM

## 2015-04-27 ENCOUNTER — Ambulatory Visit: Payer: Medicare Other | Admitting: Orthopedic Surgery

## 2015-04-27 NOTE — Progress Notes (Signed)
Called patient; scheduled in cancellation slot today, 04/27/15.

## 2015-05-04 ENCOUNTER — Ambulatory Visit (INDEPENDENT_AMBULATORY_CARE_PROVIDER_SITE_OTHER): Payer: Medicare Other | Admitting: Orthopedic Surgery

## 2015-05-04 VITALS — BP 147/93 | Ht 64.0 in | Wt 238.8 lb

## 2015-05-04 DIAGNOSIS — M23300 Other meniscus derangements, unspecified lateral meniscus, right knee: Secondary | ICD-10-CM | POA: Diagnosis not present

## 2015-05-04 DIAGNOSIS — M23303 Other meniscus derangements, unspecified medial meniscus, right knee: Secondary | ICD-10-CM

## 2015-05-04 DIAGNOSIS — M1711 Unilateral primary osteoarthritis, right knee: Secondary | ICD-10-CM

## 2015-05-04 DIAGNOSIS — M25561 Pain in right knee: Secondary | ICD-10-CM | POA: Diagnosis not present

## 2015-05-04 DIAGNOSIS — M23306 Other meniscus derangements, unspecified meniscus, right knee: Secondary | ICD-10-CM
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
## 2015-05-14 ENCOUNTER — Ambulatory Visit (INDEPENDENT_AMBULATORY_CARE_PROVIDER_SITE_OTHER): Payer: Medicare Other | Admitting: Internal Medicine

## 2015-05-14 ENCOUNTER — Encounter: Payer: Self-pay | Admitting: Internal Medicine

## 2015-05-14 VITALS — BP 162/94 | HR 74 | Ht 64.0 in | Wt 242.7 lb

## 2015-05-14 DIAGNOSIS — Z79899 Other long term (current) drug therapy: Secondary | ICD-10-CM

## 2015-05-14 DIAGNOSIS — E785 Hyperlipidemia, unspecified: Secondary | ICD-10-CM | POA: Diagnosis not present

## 2015-05-14 DIAGNOSIS — G4733 Obstructive sleep apnea (adult) (pediatric): Secondary | ICD-10-CM | POA: Insufficient documentation

## 2015-05-14 DIAGNOSIS — R9431 Abnormal electrocardiogram [ECG] [EKG]: Secondary | ICD-10-CM | POA: Diagnosis not present

## 2015-05-14 DIAGNOSIS — E119 Type 2 diabetes mellitus without complications: Secondary | ICD-10-CM | POA: Diagnosis not present

## 2015-05-14 DIAGNOSIS — I10 Essential (primary) hypertension: Secondary | ICD-10-CM | POA: Diagnosis not present

## 2015-05-14 DIAGNOSIS — Z794 Long term (current) use of insulin: Secondary | ICD-10-CM

## 2015-05-14 DIAGNOSIS — IMO0001 Reserved for inherently not codable concepts without codable children: Secondary | ICD-10-CM

## 2015-05-14 DIAGNOSIS — Z9989 Dependence on other enabling machines and devices: Secondary | ICD-10-CM

## 2015-05-14 LAB — LIPID PANEL
Cholesterol: 129 mg/dL (ref 125–200)
HDL: 35 mg/dL — AB (ref >=40)
LDL Cholesterol: 72 mg/dL (ref <130)
TRIGLYCERIDES: 108 mg/dL (ref <150)
Total CHOL/HDL Ratio: 3.7 Ratio (ref <=5.0)
VLDL: 22 mg/dL (ref <30)

## 2015-05-14 LAB — COMPREHENSIVE METABOLIC PANEL
ALT: 15 U/L (ref 9–46)
AST: 14 U/L (ref 10–35)
Albumin: 4.1 g/dL (ref 3.6–5.1)
Alkaline Phosphatase: 59 U/L (ref 40–115)
BUN: 32 mg/dL — AB (ref 7–25)
CHLORIDE: 106 mmol/L (ref 98–110)
CO2: 28 mmol/L (ref 20–31)
CREATININE: 2.88 mg/dL — AB (ref 0.70–1.25)
Calcium: 8.9 mg/dL (ref 8.6–10.3)
GLUCOSE: 165 mg/dL — AB (ref 65–99)
POTASSIUM: 4.6 mmol/L (ref 3.5–5.3)
SODIUM: 138 mmol/L (ref 135–146)
Total Bilirubin: 0.3 mg/dL (ref 0.2–1.2)
Total Protein: 7.1 g/dL (ref 6.1–8.1)

## 2015-05-14 LAB — HEMOGLOBIN A1C
Hgb A1c MFr Bld: 7.8 % — ABNORMAL HIGH (ref <5.7)
Mean Plasma Glucose: 177 mg/dL — ABNORMAL HIGH (ref <117)

## 2015-05-14 NOTE — Progress Notes (Signed)
OFFICE NOTE  Chief Complaint:  No complaints  Primary Care Physician: [name], MD  HPI:  [name] [name] Is a pleasant 64 year old male with a history of insulin-dependent diabetes, dyslipidemia, and hypertension. He's been a diabetic since 1999. He's been on insulin for only 3-4 years. He was a former patient of Dr. Rollene Fare who ordered a stress test in 2014. This demonstrated reversible inferoapical ischemia and was read as moderate risk. He ultimately underwent heart catheterization by Dr. Ellyn Hack on 03/28/2013 which demonstrated a small, possibly minimal aneurysmal bleb in the LAD but no significant disease. There is a dominant RCA with no disease. Essentially there was no significant obstructive coronary disease. At the time his EKG did show inferior T-wave abnormalities and very small R waves but no evidence for Q waves. Today he is referred back for abnormalities EKG which directly compared to his old EKG shows no significant change. There is an IVCD and very small R waves inferiorly, which the computer may have this read as inferior infarct. I do not see any significant change compared to his previous studies. He denies any new or worsening chest pain or shortness of breath.  [name] [name] returns today for follow-up. Overall he is feeling fairly well. He denies any chest pain or shortness of breath. Blood pressure is slightly elevated to 162/94, however recheck was 140/84. Please not been able to lose much weight. He is on chronic Plavix therapy due to what was thought to be plaque rupture but did not require stent by catheterization in 2014. This was a preferred agent over aspirin and he's remained on that, but certainly could come off of that if he were to undergo any procedures he is contemplating a penile implant for erectile dysfunction.  PMHx:  Past Medical History  Diagnosis Date  . COPD (chronic obstructive pulmonary disease) ([place])   . Sinusitis   . PUD (peptic ulcer  disease)     remote, reports f/u EGD about 8 years ago unremarkable   . Diabetes ([place])   . GERD (gastroesophageal reflux disease)   . Hyperlipidemia   . Hypertension   . Sleep apnea   . Post traumatic stress disorder   . Anxiety   . Depression   . Bell palsy   . Obesity     Truncal  . Heart disease     family history  . Hepatomegaly     noted on noncontrast CT 2015  . Vitamin D deficiency   . Chronic back pain   . Chronic shoulder pain   . Chronic knee pain   . Chronic pain     Past Surgical History  Procedure Laterality Date  . Asad lt shoulder  12/2008    left shoulder  . Umbilical hernia repair  2007    [place]  . Knee arthroscopy  10/2007    left  . Toenail excision      removed x2-bilateral  . Eye surgery  12/22/2010    tear duct probing[place]  . Foreign body removal  03/29/2011    Procedure: REMOVAL FOREIGN BODY EXTREMITY;  Surgeon: Arther Abbott, MD;  Location: [facility];  Service: Orthopedics;  Laterality: Right;  Removal Foreign Body Right Thumb  . Quadriceps tendon repair  07/21/2011    Procedure: REPAIR QUADRICEP TENDON;  Surgeon: Arther Abbott, MD;  Location: [facility];  Service: Orthopedics;  Laterality: Right;  . Nm myoview ltd    . Doppler echocardiography    . Left heart catheterization with coronary angiogram  N/A 03/28/2013    Procedure: LEFT HEART CATHETERIZATION WITH CORONARY ANGIOGRAM;  Surgeon: Leonie Man, MD;  Location[facility] C[facility] LAB;  Service: Cardiovascular;  Laterality: N/A;  . Colonoscopy  10/2008    Fields: Rectal polyp obliterated, not retrieved, hemorrhoids, single ascending colon diverticulum near the CV. Next colonoscopy April 2020  . Colonoscopy N/A 12/25/2014    SLF: 1. Colorectal polyps (2) removed 2. Small internal hemorrhoids 3. the left colon is severely redundant  . Esophagogastroduodenoscopy N/A 12/25/2014    SLF: 1. Anemia most likely due to CRI, gastritis, gastric polyps 2. Moderate non-erosive gastriits and mild duodenitis.  3.TWo  large gstric polyps removed.     FAMHx:  Family History  Problem Relation Age of Onset  . Hypertension Mother     MI  . Cancer Mother     breast   . Diabetes Mother   . Diabetes Father   . Hypertension Father   . Hypertension Sister   . Diabetes Sister   . Arthritis    . Asthma    . Lung disease    . Anesthesia problems Neg Hx   . Hypotension Neg Hx   . Malignant hyperthermia Neg Hx   . Pseudochol deficiency Neg Hx   . Colon cancer Neg Hx     SOCHx:   reports that he quit smoking about 5 years ago. His smoking use included Cigarettes. He has a 25 pack-year smoking history. He does not have any smokeless tobacco history on file. He reports that he drinks alcohol. He reports that he does not use illicit drugs.  ALLERGIES:  Allergies  Allergen Reactions  . Opana [Oxymorphone Hcl] Itching  . Tramadol Itching    ROS: A comprehensive review of systems was negative.  HOME MEDS: Current Outpatient Prescriptions  Medication Sig Dispense Refill  . albuterol (PROVENTIL HFA) 108 (90 BASE) MCG/ACT inhaler Inhale 2 puffs into the lungs every 4 (four) hours as needed for wheezing.     Marland Kitchen amLODipine (NORVASC) 10 MG tablet Take 10 mg by mouth daily.     . budesonide-formoterol (SYMBICORT) 160-4.5 MCG/ACT inhaler Inhale 2 puffs into the lungs daily as needed (Shortness of Breath).     . calcitRIOL (ROCALTROL) 0.25 MCG capsule Take 0.25 mcg by mouth daily.    . clopidogrel (PLAVIX) 75 MG tablet Take 1 tablet (75 mg total) by mouth daily. 30 tablet 10  . insulin glargine (LANTUS) 100 UNIT/ML injection Inject 40 Units into the skin at bedtime.     . iron polysaccharides (NIFEREX) 150 MG capsule Take 150 mg by mouth daily.    Marland Kitchen linagliptin (TRADJENTA) 5 MG TABS tablet Take 1 tablet (5 mg total) by mouth daily. 30 tablet 5  . loratadine (CLARITIN) 10 MG tablet Take 10 mg by mouth as needed for allergies.     . mometasone (NASONEX) 50 MCG/ACT nasal spray Place 2 sprays into the nose daily.      . pantoprazole (PROTONIX) 40 MG tablet Take 40 mg by mouth daily.    . simvastatin (ZOCOR) 20 MG tablet Take 20 mg by mouth every morning.     . valsartan (DIOVAN) 40 MG tablet Take 1 tablet (40 mg total) by mouth daily. 30 tablet 5   No current facility-administered medications for this visit.    LABS/IMAGING: No results found for this or any previous visit (from the past 48 hour(s)). No results found.  VITALS: BP 162/94 mmHg  Pulse 74  Ht 5\' 4"  (1.626 m)  Wt 242 lb 11.2 oz (110.088 kg)  BMI 41.64 kg/m2  EXAM: General appearance: alert and no distress Neck: no carotid bruit and no JVD Lungs: clear to auscultation bilaterally Heart: regular rate and rhythm, S1, S2 normal, no murmur, click, rub or gallop Abdomen: soft, non-tender; bowel sounds normal; no masses,  no organomegaly and morbidly obse Extremities: extremities normal, atraumatic, no cyanosis or edema Pulses: 2+ and symmetric Skin: Skin color, texture, turgor normal. No rashes or lesions Neurologic: Grossly normal Psyvh: Normal  EKG: Sinus rhythm with first-degree AV block, lateral T-wave changes at 74  ASSESSMENT: 1. Mild coronary artery disease by cath in September 2014, question plaque rupture-on Plavix (no stent) 2. Insulin-dependent diabetes 3. Dyslipidemia-controlled 4. Hypertension-at goal 5. Morbid obesity 6. Erectile dysfunction 7. OSA on CPAP  PLAN: 1.   [name] [name] needs to continue to work on risk factor modification. Blood pressure is fairly well controlled. Cholesterol is followed by his primary care provider. Diabetes is not at goal and he sees an endocrinologist. He's recently been having problems with erectile dysfunction and his surgeon is requesting the ability to calm off Plavix for a penile implant. That should not be an issue and I did recommend holding it for 5 days prior to the procedure. He's also been having problems with his sleep apnea and will need new equipment for CPAP. Apparently  has an appointment to address that later today. To see him back annually or sooner as necessary.  Pixie Casino, MD, [facility] Attending Cardiologist [place] 05/14/2015, 5:47 PM

## 2015-05-14 NOTE — Patient Instructions (Signed)
Your physician recommends that you return for lab work FASTING  Your physician wants you to follow-up in: 6 months with Dr. Debara Pickett. You will receive a reminder letter in the mail two months in advance. If you don't receive a letter, please call our office to schedule the follow-up appointment.

## 2015-05-19 ENCOUNTER — Telehealth: Payer: Self-pay

## 2015-05-19 NOTE — Telephone Encounter (Signed)
Pt has appt on 06-04-15. Has already done labs. Has not been in since 12-02-14. He is requesting 90 day supply of Tradjenta and Lantus to be sent to [facility]. Can he have this?

## 2015-05-20 ENCOUNTER — Other Ambulatory Visit: Payer: Self-pay | Admitting: Internal Medicine

## 2015-05-20 MED ORDER — CLOPIDOGREL BISULFATE 75 MG PO TABS: 75.0000 mg | ORAL_TABLET | Freq: Every day | ORAL | Status: DC

## 2015-05-20 NOTE — Telephone Encounter (Signed)
Pt notified that we are unable to refill medications until his next visit but to pick up a Toujeo sample

## 2015-05-20 NOTE — Telephone Encounter (Signed)
We will wait until his visit to refill. He may get a pen of Toujeo sample to last him until his visit.

## 2015-05-27 ENCOUNTER — Other Ambulatory Visit: Payer: Self-pay | Admitting: Occupational Medicine

## 2015-05-27 ENCOUNTER — Ambulatory Visit: Payer: Self-pay

## 2015-05-27 DIAGNOSIS — Z0000 Encounter for general adult medical examination without abnormal findings: Secondary | ICD-10-CM

## 2015-05-31 ENCOUNTER — Encounter: Payer: Self-pay | Admitting: Gastroenterology

## 2015-06-04 ENCOUNTER — Encounter: Payer: Self-pay | Admitting: "Endocrinology

## 2015-06-04 ENCOUNTER — Ambulatory Visit (INDEPENDENT_AMBULATORY_CARE_PROVIDER_SITE_OTHER): Payer: Medicare Other | Admitting: "Endocrinology

## 2015-06-04 VITALS — BP 133/87 | HR 67 | Ht 64.0 in | Wt 238.2 lb

## 2015-06-04 DIAGNOSIS — E1122 Type 2 diabetes mellitus with diabetic chronic kidney disease: Secondary | ICD-10-CM

## 2015-06-04 DIAGNOSIS — I10 Essential (primary) hypertension: Secondary | ICD-10-CM | POA: Diagnosis not present

## 2015-06-04 DIAGNOSIS — D519 Vitamin B12 deficiency anemia, unspecified: Secondary | ICD-10-CM | POA: Diagnosis not present

## 2015-06-04 DIAGNOSIS — N184 Chronic kidney disease, stage 4 (severe): Secondary | ICD-10-CM

## 2015-06-04 DIAGNOSIS — E785 Hyperlipidemia, unspecified: Secondary | ICD-10-CM | POA: Diagnosis not present

## 2015-06-04 DIAGNOSIS — R809 Proteinuria, unspecified: Secondary | ICD-10-CM | POA: Diagnosis not present

## 2015-06-04 DIAGNOSIS — N183 Chronic kidney disease, stage 3 (moderate): Secondary | ICD-10-CM | POA: Diagnosis not present

## 2015-06-04 DIAGNOSIS — E559 Vitamin D deficiency, unspecified: Secondary | ICD-10-CM

## 2015-06-04 DIAGNOSIS — Z794 Long term (current) use of insulin: Secondary | ICD-10-CM

## 2015-06-04 DIAGNOSIS — Z79899 Other long term (current) drug therapy: Secondary | ICD-10-CM | POA: Diagnosis not present

## 2015-06-04 NOTE — Progress Notes (Signed)
Subjective:    Patient ID: [name] [name], male    DOB: 1951/03/16, PCP [name], MD   Past Medical History  Diagnosis Date  . COPD (chronic obstructive pulmonary disease) ([place])   . Sinusitis   . PUD (peptic ulcer disease)     remote, reports f/u EGD about 8 years ago unremarkable   . Diabetes ([place])   . GERD (gastroesophageal reflux disease)   . Hyperlipidemia   . Hypertension   . Sleep apnea   . Post traumatic stress disorder   . Anxiety   . Depression   . Bell palsy   . Obesity     [place]  . Heart disease     family history  . Hepatomegaly     noted on noncontrast CT 2015  . Vitamin D deficiency   . Chronic back pain   . Chronic shoulder pain   . Chronic knee pain   . Chronic pain    Past Surgical History  Procedure Laterality Date  . Asad lt shoulder  12/2008    left shoulder  . Umbilical hernia repair  2007    [place]  . Knee arthroscopy  10/2007    left  . Toenail excision      removed x2-bilateral  . Eye surgery  12/22/2010    tear duct probing-[place]  . Foreign body removal  03/29/2011    Procedure: REMOVAL FOREIGN BODY EXTREMITY;  Surgeon: Arther Abbott, MD;  Location: [facility];  Service: Orthopedics;  Laterality: Right;  Removal Foreign Body Right Thumb  . Quadriceps tendon repair  07/21/2011    Procedure: REPAIR QUADRICEP TENDON;  Surgeon: Arther Abbott, MD;  Location: [facility];  Service: Orthopedics;  Laterality: Right;  . Nm myoview ltd    . Doppler echocardiography    . Left heart catheterization with coronary angiogram N/A 03/28/2013    Procedure: LEFT HEART CATHETERIZATION WITH CORONARY ANGIOGRAM;  Surgeon: Leonie Man, MD;  Location: [facility] CATH LAB;  Service: Cardiovascular;  Laterality: N/A;  . Colonoscopy  10/2008    Fields: Rectal polyp obliterated, not retrieved, hemorrhoids, single ascending colon diverticulum near the CV. Next colonoscopy April 2020  . Colonoscopy N/A 12/25/2014    SLF: 1. Colorectal polyps (2) removed 2. Small  internal hemorrhoids 3. the left colon is severely redundant  . Esophagogastroduodenoscopy N/A 12/25/2014    SLF: 1. Anemia most likely due to CRI, gastritis, gastric polyps 2. Moderate non-erosive gastriits and mild duodenitis.  3.TWo large gstric polyps removed.    Social History   Social History  . Marital Status: Married    Spouse Name: N/A  . Number of Children: 2  . Years of Education: 12th grade   Occupational History  . disabled   .     [employer]     Social History Main Topics  . Smoking status: Former Smoker -- 1.00 packs/day for 25 years    Types: Cigarettes    Quit date: 03/27/2010  . Smokeless tobacco: None     Comment: Quit x 7 years  . Alcohol Use: 0.0 oz/week    0 Standard drinks or equivalent per week     Comment: occasionally  . Drug Use: No     Comment: stopped feb2011  . Sexual Activity: Not Asked   Other Topics Concern  . None   Social History Narrative   He quit smoking in 2010. 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 He developed pulmonary problems,  became disabled because of lower airway disease in 2009.    Outpatient Encounter Prescriptions as of 06/04/2015  Medication Sig  . albuterol (PROVENTIL HFA) 108 (90 BASE) MCG/ACT inhaler Inhale 2 puffs into the lungs every 4 (four) hours as needed for wheezing.   Marland Kitchen amLODipine (NORVASC) 10 MG tablet Take 10 mg by mouth daily.   . budesonide-formoterol (SYMBICORT) 160-4.5 MCG/ACT inhaler Inhale 2 puffs into the lungs daily as needed (Shortness of Breath).   . calcitRIOL (ROCALTROL) 0.25 MCG capsule Take 0.25 mcg by mouth daily.  . clopidogrel (PLAVIX) 75 MG tablet Take 1 tablet (75 mg total) by mouth daily.  . insulin glargine (LANTUS) 100 UNIT/ML injection Inject 50 Units into the skin at bedtime.  . iron polysaccharides (NIFEREX) 150 MG capsule Take 150 mg by mouth daily.  Marland Kitchen linagliptin (TRADJENTA) 5 MG TABS tablet Take 1 tablet (5 mg total) by mouth daily.  Marland Kitchen loratadine  (CLARITIN) 10 MG tablet Take 10 mg by mouth as needed for allergies.   . mometasone (NASONEX) 50 MCG/ACT nasal spray Place 2 sprays into the nose daily.   . pantoprazole (PROTONIX) 40 MG tablet Take 40 mg by mouth daily.  . simvastatin (ZOCOR) 20 MG tablet Take 20 mg by mouth every morning.   . valsartan (DIOVAN) 40 MG tablet Take 1 tablet (40 mg total) by mouth daily.   No facility-administered encounter medications on file as of 06/04/2015.   ALLERGIES: Allergies  Allergen Reactions  . Opana [Oxymorphone Hcl] Itching  . Tramadol Itching   VACCINATION STATUS: Immunization History  Administered Date(s) Administered  . Td 03/27/2011    Diabetes He presents for his follow-up diabetic visit. He has type 2 diabetes mellitus. Onset time: He was diagnosed approximately 20 years ago. His disease course has been worsening. There are no hypoglycemic associated symptoms. Pertinent negatives for hypoglycemia include no confusion, headaches, pallor or seizures. Associated symptoms include polyuria. Pertinent negatives for diabetes include no chest pain, no fatigue, no polydipsia, no polyphagia and no weakness. There are no hypoglycemic complications. Symptoms are worsening. Diabetic complications include nephropathy. Risk factors for coronary artery disease include dyslipidemia, diabetes mellitus, hypertension, obesity, sedentary lifestyle, tobacco exposure and male sex. Current diabetic treatment includes insulin injections and oral agent (monotherapy). He is compliant with treatment most of the time. He is following a generally unhealthy diet. He has had a previous visit with a dietitian. He rarely participates in exercise. His breakfast blood glucose range is generally 140-180 mg/dl. Eye exam is current.  Hypertension This is a chronic problem. The current episode started more than 1 year ago. The problem is controlled. Pertinent negatives include no chest pain, headaches, neck pain, palpitations or  shortness of breath. Risk factors for coronary artery disease include diabetes mellitus, dyslipidemia, obesity, smoking/tobacco exposure and sedentary lifestyle. Past treatments include angiotensin blockers. Hypertensive end-organ damage includes kidney disease.  Hyperlipidemia This is a chronic problem. The current episode started more than 1 year ago. Pertinent negatives include no chest pain, myalgias or shortness of breath. Current antihyperlipidemic treatment includes statins. Risk factors for coronary artery disease include diabetes mellitus, dyslipidemia, hypertension, male sex and a sedentary lifestyle.     Review of Systems  Constitutional: Negative for fatigue and unexpected weight change.  HENT: Negative for dental problem, mouth sores and trouble swallowing.   Eyes: Negative for visual disturbance.  Respiratory: Negative for cough, choking, chest tightness, shortness of breath and wheezing.   Cardiovascular: Negative for chest pain, palpitations and leg swelling.  Gastrointestinal: Negative for nausea, vomiting, abdominal pain, diarrhea, constipation and abdominal distention.  Endocrine: Positive for polyuria. Negative for polydipsia and polyphagia.  Genitourinary: Negative for dysuria, urgency, hematuria and flank pain.  Musculoskeletal: Negative for myalgias, back pain, gait problem and neck pain.  Skin: Negative for pallor, rash and wound.  Neurological: Negative for seizures, syncope, weakness, numbness and headaches.  Psychiatric/Behavioral: Negative.  Negative for confusion and dysphoric mood.    Objective:    BP 133/87 mmHg  Pulse 67  Ht 5\' 4"  (1.626 m)  Wt 238 lb 3.2 oz (108.047 kg)  BMI 40.87 kg/m2  SpO2 95%  Wt Readings from Last 3 Encounters:  06/04/15 238 lb 3.2 oz (108.047 kg)  05/14/15 242 lb 11.2 oz (110.088 kg)  05/04/15 238 lb 12.8 oz (108.319 kg)    Physical Exam  Constitutional: He is oriented to person, place, and time. He appears well-developed and  well-nourished. He is cooperative. No distress.  HENT:  Head: Normocephalic and atraumatic.  Eyes: EOM are normal.  Neck: Normal range of motion. Neck supple. No tracheal deviation present. No thyromegaly present.  Cardiovascular: Normal rate, S1 normal, S2 normal and normal heart sounds.  Exam reveals no gallop.   No murmur heard. Pulses:      Dorsalis pedis pulses are 1+ on the right side, and 1+ on the left side.       Posterior tibial pulses are 1+ on the right side, and 1+ on the left side.  Pulmonary/Chest: Breath sounds normal. No respiratory distress. He has no wheezes.  Abdominal: Soft. Bowel sounds are normal. He exhibits no distension. There is no tenderness. There is no guarding and no CVA tenderness.  Musculoskeletal: He exhibits no edema.       Right shoulder: He exhibits no swelling and no deformity.  Neurological: He is alert and oriented to person, place, and time. He has normal strength and normal reflexes. No cranial nerve deficit or sensory deficit. Gait normal.  Skin: Skin is warm and dry. No rash noted. No cyanosis. Nails show no clubbing.  Psychiatric: He has a normal mood and affect. His speech is normal and behavior is normal. Judgment and thought content normal. Cognition and memory are normal.    Results for orders placed or performed in visit on 05/14/15  Lipid panel  Result Value Ref Range   Cholesterol 129 125 - 200 mg/dL   Triglycerides 108 <150 mg/dL   HDL 35 (L) >=40 mg/dL   Total CHOL/HDL Ratio 3.7 <=5.0 Ratio   VLDL 22 <30 mg/dL   LDL Cholesterol 72 <130 mg/dL  Comprehensive metabolic panel  Result Value Ref Range   Sodium 138 135 - 146 mmol/L   Potassium 4.6 3.5 - 5.3 mmol/L   Chloride 106 98 - 110 mmol/L   CO2 28 20 - 31 mmol/L   Glucose, Bld 165 (H) 65 - 99 mg/dL   BUN 32 (H) 7 - 25 mg/dL   Creat 2.88 (H) 0.70 - 1.25 mg/dL   Total Bilirubin 0.3 0.2 - 1.2 mg/dL   Alkaline Phosphatase 59 40 - 115 U/L   AST 14 10 - 35 U/L   ALT 15 9 - 46 U/L    Total Protein 7.1 6.1 - 8.1 g/dL   Albumin 4.1 3.6 - 5.1 g/dL   Calcium 8.9 8.6 - 10.3 mg/dL  HgB A1c  Result Value Ref Range   Hgb A1c MFr Bld 7.8 (H) <5.7 %   Mean Plasma Glucose 177 (H) <117 mg/dL  Complete Blood Count (Most recent): Lab Results  Component Value Date   WBC 3.5* 12/25/2014   HGB 11.6* 12/25/2014   HCT 35.9* 12/25/2014   MCV 82.9 12/25/2014   PLT 133* 12/25/2014   Chemistry (most recent): Lab Results  Component Value Date   NA 138 05/14/2015   K 4.6 05/14/2015   CL 106 05/14/2015   CO2 28 05/14/2015   BUN 32* 05/14/2015   CREATININE 2.88* 05/14/2015   Diabetic Labs (most recent): Lab Results  Component Value Date   HGBA1C 7.8* 05/14/2015   Lipid profile (most recent): Lab Results  Component Value Date   TRIG 108 05/14/2015   CHOL 129 05/14/2015     Assessment & Plan:   1. Type 2 diabetes mellitus with stage 4 chronic kidney disease, with long-term current use of insulin (HCC)  -His  Diabetes is complicated by chronic kidney disease and patient remains at a high risk for more acute and chronic complications of diabetes which include CAD, CVA, CKD, retinopathy, and neuropathy. These are all discussed in detail with the patient.  Patient came with controlled fasting glucose profile, and  recent A1c of 7.8% unchanged from last visit.  Glucose logs and insulin administration records pertaining to this visit,  to be scanned into patient's records.  Recent labs reviewed.   - I have re-counseled the patient on diet management and weight loss  by adopting a carbohydrate restricted / protein rich  Diet.  - Suggestion is made for patient to avoid simple carbohydrates   from their diet including Cakes , Desserts, Ice Cream,  Soda (  diet and regular) , Sweet Tea , Candies,  Chips, Cookies, Artificial Sweeteners,   and "Sugar-free" Products .  This will help patient to have stable blood glucose profile and potentially avoid unintended  Weight gain.  -  Patient is advised to stick to a routine mealtimes to eat 3 meals  a day and avoid unnecessary snacks ( to snack only to correct hypoglycemia). I have also advised him to cut back on alcohol consumption.  - The patient  has been  scheduled with Jearld Fenton, RDN, CDE for individualized DM education.  - I have approached patient with the following individualized plan to manage diabetes and patient agrees.  - I re-counseled him to stay focused to maintain control his DM.  -He is advised to stay away from ETOH and smoking. He will continue to need at least basal insulin therapy.  -I increased Lantus to 50 units qhs, off of Humalog . Continue tradjenta 5mg  po qday.  -He will be considered for prandial insulin if he continues to have high A1c or documented hyperglycemia.  He is following with Jearld Fenton, CDE for DM education.  He will be monitoring of BG BID.  -He will continue to f/u with Nephrology given stage IV CK D,  even after his DM is controlled.  -Patient is not a candidate for metformin and SGLT2 inhibitors due to CKD.  - Patient specific target  for A1c; LDL, HDL, Triglycerides, and  Waist Circumference were discussed in detail.  2) BP/HTN: Controlled. Continue current medications including ACEI/ARB. 3) Lipids/HPL:  continue statins. 4)  Weight/Diet: CDE consult in progress, exercise, and carbohydrates information provided. 5) Vitamin D deficiency -I advised him to continue vitamin D 50,000 units weekly  6) Chronic Care/Health Maintenance:  -Patient is on ACEI/ARB and Statin medications and encouraged to continue to follow up with Ophthalmology, Podiatrist at least yearly or according to recommendations,  and advised to quit smoking. I have recommended yearly flu vaccine and pneumonia vaccination at least every 5 years; moderate intensity exercise for up to 150 minutes weekly; and  sleep for at least 7 hours a day.  - 25 minutes of time was spent on the care of this patient ,  50% of which was applied for counseling on diabetes complications and their preventions.  - I advised patient to maintain close follow up with their PCP for primary care needs. - Patient is asked to bring meter and  blood glucose logs during their next visit.   Follow up plan: -Return in about 3 months (around 09/04/2015) for diabetes, high blood pressure, high cholesterol, follow up with pre-visit labs, meter, and logs.  Glade Lloyd, MD Phone: [phone]  Fax: [phone]   06/04/2015, 1:07 PM

## 2015-06-04 NOTE — Patient Instructions (Signed)

## 2015-06-07 ENCOUNTER — Other Ambulatory Visit: Payer: Self-pay

## 2015-06-07 MED ORDER — LINAGLIPTIN 5 MG PO TABS: 5.0000 mg | ORAL_TABLET | Freq: Every day | ORAL | Status: DC

## 2015-06-09 DIAGNOSIS — R809 Proteinuria, unspecified: Secondary | ICD-10-CM | POA: Diagnosis not present

## 2015-06-09 DIAGNOSIS — N183 Chronic kidney disease, stage 3 (moderate): Secondary | ICD-10-CM | POA: Diagnosis not present

## 2015-06-09 DIAGNOSIS — E1165 Type 2 diabetes mellitus with hyperglycemia: Secondary | ICD-10-CM | POA: Diagnosis not present

## 2015-06-09 DIAGNOSIS — N184 Chronic kidney disease, stage 4 (severe): Secondary | ICD-10-CM | POA: Diagnosis not present

## 2015-06-09 DIAGNOSIS — N2581 Secondary hyperparathyroidism of renal origin: Secondary | ICD-10-CM | POA: Diagnosis not present

## 2015-06-09 DIAGNOSIS — I10 Essential (primary) hypertension: Secondary | ICD-10-CM | POA: Diagnosis not present

## 2015-07-05 ENCOUNTER — Encounter: Payer: Self-pay | Admitting: Gastroenterology

## 2015-07-05 ENCOUNTER — Ambulatory Visit (INDEPENDENT_AMBULATORY_CARE_PROVIDER_SITE_OTHER): Payer: Self-pay | Admitting: Orthopedic Surgery

## 2015-07-05 ENCOUNTER — Ambulatory Visit (INDEPENDENT_AMBULATORY_CARE_PROVIDER_SITE_OTHER): Payer: Medicare Other | Admitting: Gastroenterology

## 2015-07-05 VITALS — BP 145/90 | HR 71 | Temp 97.8°F | Ht 64.0 in | Wt 238.0 lb

## 2015-07-05 VITALS — BP 150/90 | Ht 64.0 in | Wt 238.0 lb

## 2015-07-05 DIAGNOSIS — K219 Gastro-esophageal reflux disease without esophagitis: Secondary | ICD-10-CM | POA: Diagnosis not present

## 2015-07-05 DIAGNOSIS — R160 Hepatomegaly, not elsewhere classified: Secondary | ICD-10-CM | POA: Diagnosis not present

## 2015-07-05 DIAGNOSIS — R932 Abnormal findings on diagnostic imaging of liver and biliary tract: Secondary | ICD-10-CM | POA: Diagnosis not present

## 2015-07-05 DIAGNOSIS — K640 First degree hemorrhoids: Secondary | ICD-10-CM | POA: Diagnosis not present

## 2015-07-05 DIAGNOSIS — M23306 Other meniscus derangements, unspecified meniscus, right knee: Secondary | ICD-10-CM

## 2015-07-05 DIAGNOSIS — M23300 Other meniscus derangements, unspecified lateral meniscus, right knee: Secondary | ICD-10-CM

## 2015-07-05 DIAGNOSIS — K649 Unspecified hemorrhoids: Secondary | ICD-10-CM | POA: Insufficient documentation

## 2015-07-05 DIAGNOSIS — M23303 Other meniscus derangements, unspecified medial meniscus, right knee: Secondary | ICD-10-CM

## 2015-07-05 DIAGNOSIS — M1711 Unilateral primary osteoarthritis, right knee: Secondary | ICD-10-CM

## 2015-07-05 MED ORDER — HYDROCORTISONE 2.5 % RE CREA: 1.0000 "application " | TOPICAL_CREAM | Freq: Two times a day (BID) | RECTAL | Status: DC
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
## 2015-07-05 NOTE — Assessment & Plan Note (Signed)
Feeling of incomplete evacuation possibly related to internal hemorrhoids flare. Anusol cream twice a day rectally for 14 days. We discussed briefly possible CRH hemorrhoid banding. If he continues to have problems he will let me know. Colonoscopy up-to-date.

## 2015-07-05 NOTE — Assessment & Plan Note (Signed)
Breakthrough symptoms in the setting of dietary indiscretions, behavioral indiscretions. Reiterated antireflux measures. Continue pantoprazole once daily. May use when necessary Zantac, Tums, baking soda. If breakthrough symptoms become more regularly, we could consider increasing PPI to twice a day. He'll let me know. Otherwise, back in 6 months or sooner if needed.

## 2015-07-05 NOTE — Progress Notes (Signed)
Follow-up visit regarding right knee  Patient seen 05/04/2015 had an MRI of his right knee has a history of a quadriceps tendon rupture and the MRI showed a lateral meniscus tear mild to moderate arthritis patellar tendinopathy chronic changes in the quadriceps tendon from repair  Came in today complaining of medial lateral joint pain limp  Repeat examination showed that he had medial peripatellar tenderness lateral pelvic patellar tenderness and lateral joint line tenderness without effusion  His knee flexion is approximately 115 so he has lost some motion. Knee remained stable neurovascular exam is intact  He is ambulatory with a limp  He is otherwise oriented 3 mood pleasant hygiene normal blood pressure was slightly elevated but he said it was normal in the gastroenterologist office so we will take it again with a regular cuff  He will get a cardiology consult secondary to Plavix and a non-stent for coronary artery issue  If they approve then we will proceed with  Arthroscopy right knee and lateral meniscectomy  This will be arranged in January  Past Medical History  Diagnosis Date  . COPD (chronic obstructive pulmonary disease) ([place])   . Sinusitis   . PUD (peptic ulcer disease)     remote, reports f/u EGD about 8 years ago unremarkable   . Diabetes ([place])   . GERD (gastroesophageal reflux disease)   . Hyperlipidemia   . Hypertension   . Sleep apnea   . Post traumatic stress disorder   . Anxiety   . Depression   . Bell palsy   . Obesity     [place]  . Heart disease     family history  . Hepatomegaly     noted on noncontrast CT 2015  . Vitamin D deficiency   . Chronic back pain   . Chronic shoulder pain   . Chronic knee pain   . Chronic pain    Past Surgical History  Procedure Laterality Date  . Asad lt shoulder  12/2008    left shoulder  . Umbilical hernia repair  2007    [place]  . Knee arthroscopy  10/2007    left  . Toenail excision      removed  x2-bilateral  . Eye surgery  12/22/2010    tear duct probing-[place]  . Foreign body removal  03/29/2011    Procedure: REMOVAL FOREIGN BODY EXTREMITY;  Surgeon: Arther Abbott, MD;  Location: [facility];  Service: Orthopedics;  Laterality: Right;  Removal Foreign Body Right Thumb  . Quadriceps tendon repair  07/21/2011    Procedure: REPAIR QUADRICEP TENDON;  Surgeon: Arther Abbott, MD;  Location: [facility];  Service: Orthopedics;  Laterality: Right;  . Nm myoview ltd    . Doppler echocardiography    . Left heart catheterization with coronary angiogram N/A 03/28/2013    Procedure: LEFT HEART CATHETERIZATION WITH CORONARY ANGIOGRAM;  Surgeon: Leonie Man, MD;  Location: [facility] CATH LAB;  Service: Cardiovascular;  Laterality: N/A;  . Colonoscopy  10/2008    Fields: Rectal polyp obliterated, not retrieved, hemorrhoids, single ascending colon diverticulum near the CV. Next colonoscopy April 2020  . Colonoscopy N/A 12/25/2014    SLF: 1. Colorectal polyps (2) removed 2. Small internal hemorrhoids 3. the left colon is severely redundant  . Esophagogastroduodenoscopy N/A 12/25/2014    SLF: 1. Anemia most likely due to CRI, gastritis, gastric polyps 2. Moderate non-erosive gastriits and mild duodenitis.  3.TWo large gstric polyps removed.    Review of systems she's not having any chest pain  no lightheadedness no dizziness no headache  Encounter Diagnoses  Name Primary?  . Primary osteoarthritis of right knee   . Meniscus, lateral, derangement, right Yes

## 2015-07-05 NOTE — Patient Instructions (Signed)
Please schedule a pre op visit with your cardiologist

## 2015-07-05 NOTE — Progress Notes (Signed)
CC'ED TO PCP 

## 2015-07-05 NOTE — Progress Notes (Signed)
Primary Care Physician: [name], MD  Primary Gastroenterologist:  Barney Drain, MD   Chief Complaint  Patient presents with  . Follow-up    HPI: [name] [name] is a 64 y.o. male here for follow-up. He has a history of hepatomegaly, anemia, GERD. EGD and colonoscopy in 2016 with atrophic gastritis, 2 large gastric polyps removed,Hemoclip placed at one site to prevent bleeding. Both polyps tattooed. There were hyperplastic. Colonoscopy with severely redundant left colon, small internal hemorrhoids, 2 polyps in the rectum and sigmoid colon removed which were hyperplastic. Next colonoscopy in 10 years with overtube.  Feels like BMs less effective. Still going once per day. But feels incomplete. Hemorrhoids burning/itching. PrepH and medicated wipes helps some. Heartburn with some breakthrough at least 3-4 times per week. Worse with spicy foods. Wife states he eats and lays down. Feels burning in substernal region. Baking soda provides relief. No dysphagia. No abdominal pain. No melena or rectal bleeding.  Current Outpatient Prescriptions  Medication Sig Dispense Refill  . albuterol (PROVENTIL HFA) 108 (90 BASE) MCG/ACT inhaler Inhale 2 puffs into the lungs every 4 (four) hours as needed for wheezing.     Marland Kitchen amLODipine (NORVASC) 10 MG tablet Take 10 mg by mouth daily.     . budesonide-formoterol (SYMBICORT) 160-4.5 MCG/ACT inhaler Inhale 2 puffs into the lungs daily as needed (Shortness of Breath).     . calcitRIOL (ROCALTROL) 0.25 MCG capsule Take 0.25 mcg by mouth daily.    . clopidogrel (PLAVIX) 75 MG tablet Take 1 tablet (75 mg total) by mouth daily. 90 tablet 3  . insulin glargine (LANTUS) 100 UNIT/ML injection Inject 50 Units into the skin at bedtime.    . iron polysaccharides (NIFEREX) 150 MG capsule Take 150 mg by mouth daily.    Marland Kitchen linagliptin (TRADJENTA) 5 MG TABS tablet Take 1 tablet (5 mg total) by mouth daily. 90 tablet 0  . mometasone (NASONEX) 50 MCG/ACT nasal spray  Place 2 sprays into the nose daily.     . NON FORMULARY Sinus medication pt doesn't know the name    . pantoprazole (PROTONIX) 40 MG tablet Take 40 mg by mouth daily.    . simvastatin (ZOCOR) 20 MG tablet Take 20 mg by mouth every morning.     . valsartan (DIOVAN) 40 MG tablet Take 1 tablet (40 mg total) by mouth daily. 30 tablet 5   No current facility-administered medications for this visit.    Allergies as of 07/05/2015 - Review Complete 07/05/2015  Allergen Reaction Noted  . Opana [oxymorphone hcl] Itching 01/15/2014  . Tramadol Itching 07/10/2011    ROS:  General: Negative for anorexia, weight loss, fever, chills, fatigue, weakness. ENT: Negative for hoarseness, difficulty swallowing , nasal congestion. CV: Negative for chest pain, angina, palpitations, dyspnea on exertion, peripheral edema.  Respiratory: Negative for dyspnea at rest, dyspnea on exertion, cough, sputum, wheezing.  GI: See history of present illness. GU:  Negative for dysuria, hematuria, urinary incontinence, urinary frequency, nocturnal urination.  Endo: Negative for unusual weight change.    Physical Examination:   BP 145/90 mmHg  Pulse 71  Temp(Src) 97.8 F (36.6 C)  Ht 5\' 4"  (1.626 m)  Wt 238 lb (107.956 kg)  BMI 40.83 kg/m2  General: Well-nourished, well-developed in no acute distress.  Eyes: No icterus. Mouth: Oropharyngeal mucosa moist and pink , no lesions erythema or exudate. Lungs: Clear to auscultation bilaterally.  Heart: Regular rate and rhythm, no murmurs rubs or gallops.  Abdomen: Bowel  sounds are normal, nontender, nondistended, no hepatosplenomegaly or masses, no abdominal bruits or hernia , no rebound or guarding.   Extremities: No lower extremity edema. No clubbing or deformities. Neuro: Alert and oriented x 4   Skin: Warm and dry, no jaundice.   Psych: Alert and cooperative, normal mood and affect.  Labs:  Lab Results  Component Value Date   HGBA1C 7.8* 05/14/2015   Lab  Results  Component Value Date   WBC 3.5* 12/25/2014   HGB 11.6* 12/25/2014   HCT 35.9* 12/25/2014   MCV 82.9 12/25/2014   PLT 133* 12/25/2014   Lab Results  Component Value Date   CREATININE 2.88* 05/14/2015   BUN 32* 05/14/2015   NA 138 05/14/2015   K 4.6 05/14/2015   CL 106 05/14/2015   CO2 28 05/14/2015   Lab Results  Component Value Date   ALT 15 05/14/2015   AST 14 05/14/2015   ALKPHOS 59 05/14/2015   BILITOT 0.3 05/14/2015   Lab Results  Component Value Date   FERRITIN 44 12/25/2014    Imaging Studies: No results found.

## 2015-07-05 NOTE — Patient Instructions (Signed)
1. Trial of Anusol cream apply anal rectally twice daily for 14 days. Prescription sent to [facility]. 2. I will review most recent labs from Dr. Hinda Lenis. We will let you know if you need any additional labs. 3. Abdominal ultrasound to follow-up on your liver. We will call you with results within 5-7 business days. 4. Return to the office in 6 months or sooner if needed.

## 2015-07-06 ENCOUNTER — Telehealth: Payer: Self-pay | Admitting: Internal Medicine

## 2015-07-06 ENCOUNTER — Encounter: Payer: Self-pay | Admitting: *Deleted

## 2015-07-06 NOTE — Telephone Encounter (Signed)
Low risk for orthopedic surgery. Ok to proceed. Hold plavix 5 days prior to procedure.  Dr. Lemmie Evens

## 2015-07-06 NOTE — Telephone Encounter (Signed)
Phone rings w/ no answer, no VM pickup.

## 2015-07-06 NOTE — Telephone Encounter (Signed)
Pt called in stating that his Orthopedic doctor is needing clearance but he was in the office on 10/28  1. What office are you calling from? [facility]- Dr. [name] Bjork.   2. What is your office phone and fax number? Telephone- [phone]    3. What type of procedure is the patient having performed? Knee surgery   4. What date is procedure scheduled? Pending clearance   5. What is your question (ex. Antibiotics prior to procedure, holding medication-we need to know how long dentist wants pt to hold med)? How long can he hold his Plavix  6.

## 2015-07-06 NOTE — Telephone Encounter (Signed)
2nd call to patient with no answer.  I also called Dr. Ruthe Mannan office and notified them that I would furnish a letter for clearance & med instructions. This has been faxed to their provided number - [phone]  Requested call back from nurse Roselyn Reef if anything further required.

## 2015-07-07 ENCOUNTER — Telehealth: Payer: Self-pay | Admitting: *Deleted

## 2015-07-07 NOTE — Telephone Encounter (Signed)
FAX RECEIVED STATING PATIENT IS CLEARED FOR SURGERY  WILL PROCEED WITH SCHEDULING [facility]ARK W/ LM IN [place]

## 2015-07-08 ENCOUNTER — Telehealth: Payer: Self-pay

## 2015-07-08 NOTE — Telephone Encounter (Signed)
error 

## 2015-07-08 NOTE — Progress Notes (Signed)
Labs from 06/04/2015 Dr. Hinda Lenis. Iron 86, TIBC 326, iron saturations 21%, ferritin 43, folate 11.6, B12 762, creatinine 2.63, BUN 31, hemoglobin 12.2, hematocrit 35.8  Await pending u/s.

## 2015-07-14 DIAGNOSIS — R800 Isolated proteinuria: Secondary | ICD-10-CM | POA: Diagnosis not present

## 2015-07-14 DIAGNOSIS — E291 Testicular hypofunction: Secondary | ICD-10-CM | POA: Diagnosis not present

## 2015-07-14 DIAGNOSIS — N5201 Erectile dysfunction due to arterial insufficiency: Secondary | ICD-10-CM | POA: Diagnosis not present

## 2015-07-14 DIAGNOSIS — N183 Chronic kidney disease, stage 3 (moderate): Secondary | ICD-10-CM | POA: Diagnosis not present

## 2015-07-15 ENCOUNTER — Other Ambulatory Visit: Payer: Self-pay | Admitting: Urology

## 2015-07-20 ENCOUNTER — Telehealth: Payer: Self-pay | Admitting: Gastroenterology

## 2015-07-20 ENCOUNTER — Telehealth: Payer: Self-pay | Admitting: *Deleted

## 2015-07-20 NOTE — Telephone Encounter (Signed)
Pt called back. I told him that Magda Paganini had reviewed his labs from Dr. Berton Bon and was waiting on pending [place] results. See addendum to office visit notes of 07/05/2015.  He said he was waiting on someone to call him about an appt for [place]. Sending to [place].

## 2015-07-20 NOTE — Telephone Encounter (Signed)
Patient called stating he is returning a call. Please advise [phone]

## 2015-07-20 NOTE — Telephone Encounter (Signed)
Pt called this morning saying he was returning a call from last week. JL, GF and CJ said it wasn't them. Pt said he thinks it was [facility] that had called. I told him that I would send a phone note and someone would follow up with him. Please call [phone]

## 2015-07-20 NOTE — Telephone Encounter (Signed)
LMOM to call.

## 2015-07-20 NOTE — Telephone Encounter (Signed)
Pt is set up for [place] on 07/22/15 @ 8:00 am. Pt is aware of appointment

## 2015-07-22 ENCOUNTER — Ambulatory Visit (HOSPITAL_COMMUNITY)
Admission: RE | Admit: 2015-07-22 | Discharge: 2015-07-22 | Disposition: A | Discharge status: Home / Self Care | Payer: Medicare Other | Source: Ambulatory Visit | Attending: Gastroenterology | Admitting: Gastroenterology

## 2015-07-22 DIAGNOSIS — R93421 Abnormal radiologic findings on diagnostic imaging of right kidney: Secondary | ICD-10-CM | POA: Insufficient documentation

## 2015-07-22 DIAGNOSIS — K640 First degree hemorrhoids: Secondary | ICD-10-CM

## 2015-07-22 DIAGNOSIS — R932 Abnormal findings on diagnostic imaging of liver and biliary tract: Secondary | ICD-10-CM | POA: Insufficient documentation

## 2015-07-22 DIAGNOSIS — K219 Gastro-esophageal reflux disease without esophagitis: Secondary | ICD-10-CM

## 2015-07-22 DIAGNOSIS — R160 Hepatomegaly, not elsewhere classified: Secondary | ICD-10-CM

## 2015-07-22 DIAGNOSIS — R93422 Abnormal radiologic findings on diagnostic imaging of left kidney: Secondary | ICD-10-CM | POA: Diagnosis not present

## 2015-07-24 NOTE — Progress Notes (Signed)
Quick Note:  Please let patient know liver is still prominent and with fatty liver. Stable liver cyst. No further work up of cyst needed. lfts are normal.  Instructions for fatty liver: Recommend 1-2# weight loss per week until ideal body weight through exercise & diet. Low fat/cholesterol diet.  Avoid sweets, sodas, fruit juices, sweetened beverages like tea, etc. Gradually increase exercise from 15 min daily up to 1 hr per day 5 days/week. Limit alcohol use.  Needs LFTs every six months. Keep ov in six months ______

## 2015-07-26 ENCOUNTER — Other Ambulatory Visit: Payer: Self-pay

## 2015-07-26 DIAGNOSIS — R160 Hepatomegaly, not elsewhere classified: Secondary | ICD-10-CM

## 2015-07-26 NOTE — Progress Notes (Signed)
Quick Note:  Pt called back and is aware of results and recommendations. ______

## 2015-07-26 NOTE — Progress Notes (Signed)
Quick Note:  LMOM to call and that we will be leaving at 2:30 today. His lab orders are on file for 12/2015. ______

## 2015-08-04 NOTE — Telephone Encounter (Signed)
Patient will be having another procedure this month, states he will wait until after he is released from that procedure before he schedules his surgery with [place].

## 2015-08-07 ENCOUNTER — Other Ambulatory Visit: Payer: Self-pay | Admitting: "Endocrinology

## 2015-08-09 NOTE — Patient Instructions (Signed)
LEONDRE SWEARENGEN  08/09/2015   Your procedure is scheduled on: 08/16/2015    Report to [facility] Main  Entrance take [place]  elevators to 3rd floor to  [place] at     0700 AM.  Call this number if you have problems the morning of surgery [phone]   Remember: ONLY 1 PERSON MAY GO WITH YOU TO SHORT STAY TO GET  READY MORNING OF YOUR SURGERY.  Do not eat food or drink liquids :After Midnight.             Eat a good healthy snack prior to bedtime.               Take 1/2 of evening dose of Insulin nite before surgery.      Take these medicines the morning of surgery with A SIP OF WATER: Albuterol Inhaler if needed and bring, Symbicort Inhaler if needed and bring, Amlodipine ( Norvasc), Nasonex if needed, Protonix  DO NOT TAKE ANY DIABETIC MEDICATIONS DAY OF YOUR SURGERY                               You may not have any metal on your body including hair pins and              piercings  Do not wear jewelry, , lotions, powders or perfumes, deodorant                         Men may shave face and neck.   Do not bring valuables to the hospital. [place].  Contacts, dentures or bridgework may not be worn into surgery.  Leave suitcase in the car. After surgery it may be brought to your room.         Special Instructions: coughing and deep breathing exercises, leg exercises               Please read over the following fact sheets you were given: _____________________________________________________________________             [facility] - Preparing for Surgery Before surgery, you can play an important role.  Because skin is not sterile, your skin needs to be as free of germs as possible.  You can reduce the number of germs on your skin by washing with CHG (chlorahexidine gluconate) soap before surgery.  CHG is an antiseptic cleaner which kills germs and bonds with the skin to continue killing germs even  after washing. Please DO NOT use if you have an allergy to CHG or antibacterial soaps.  If your skin becomes reddened/irritated stop using the CHG and inform your nurse when you arrive at Short Stay. Do not shave (including legs and underarms) for at least 48 hours prior to the first CHG shower.  You may shave your face/neck. Please follow these instructions carefully:  1.  Shower with CHG Soap the night before surgery and the  morning of Surgery.  2.  If you choose to wash your hair, wash your hair first as usual with your  normal  shampoo.  3.  After you shampoo, rinse your hair and body thoroughly to remove the  shampoo.  4.  Use CHG as you would any other liquid soap.  You can apply chg directly  to the skin and wash                       Gently with a scrungie or clean washcloth.  5.  Apply the CHG Soap to your body ONLY FROM THE NECK DOWN.   Do not use on face/ open                           Wound or open sores. Avoid contact with eyes, ears mouth and genitals (private parts).                       Wash face,  Genitals (private parts) with your normal soap.             6.  Wash thoroughly, paying special attention to the area where your surgery  will be performed.  7.  Thoroughly rinse your body with warm water from the neck down.  8.  DO NOT shower/wash with your normal soap after using and rinsing off  the CHG Soap.                9.  Pat yourself dry with a clean towel.            10.  Wear clean pajamas.            11.  Place clean sheets on your bed the night of your first shower and do not  sleep with pets. Day of Surgery : Do not apply any lotions/deodorants the morning of surgery.  Please wear clean clothes to the hospital/surgery center.  FAILURE TO FOLLOW THESE INSTRUCTIONS MAY RESULT IN THE CANCELLATION OF YOUR SURGERY PATIENT SIGNATURE_________________________________  NURSE  SIGNATURE__________________________________  ________________________________________________________________________   Adam Phenix  An incentive spirometer is a tool that can help keep your lungs clear and active. This tool measures how well you are filling your lungs with each breath. Taking long deep breaths may help reverse or decrease the chance of developing breathing (pulmonary) problems (especially infection) following:  A long period of time when you are unable to move or be active. BEFORE THE PROCEDURE   If the spirometer includes an indicator to show your best effort, your nurse or respiratory therapist will set it to a desired goal.  If possible, sit up straight or lean slightly forward. Try not to slouch.  Hold the incentive spirometer in an upright position. INSTRUCTIONS FOR USE  1. Sit on the edge of your bed if possible, or sit up as far as you can in bed or on a chair. 2. Hold the incentive spirometer in an upright position. 3. Breathe out normally. 4. Place the mouthpiece in your mouth and seal your lips tightly around it. 5. Breathe in slowly and as deeply as possible, raising the piston or the ball toward the top of the column. 6. Hold your breath for 3-5 seconds or for as long as possible. Allow the piston or ball to fall to the bottom of the column. 7. Remove the mouthpiece from your mouth and breathe out normally. 8. Rest for a few seconds and repeat Steps 1 through 7 at least 10 times every 1-2 hours when you are awake. Take your time and take a few normal breaths between deep breaths. 9. The spirometer may include an indicator to show  your best effort. Use the indicator as a goal to work toward during each repetition. 10. After each set of 10 deep breaths, practice coughing to be sure your lungs are clear. If you have an incision (the cut made at the time of surgery), support your incision when coughing by placing a pillow or rolled up towels firmly  against it. Once you are able to get out of bed, walk around indoors and cough well. You may stop using the incentive spirometer when instructed by your caregiver.  RISKS AND COMPLICATIONS  Take your time so you do not get dizzy or light-headed.  If you are in pain, you may need to take or ask for pain medication before doing incentive spirometry. It is harder to take a deep breath if you are having pain. AFTER USE  Rest and breathe slowly and easily.  It can be helpful to keep track of a log of your progress. Your caregiver can provide you with a simple table to help with this. If you are using the spirometer at home, follow these instructions: [place] IF:   You are having difficultly using the spirometer.  You have trouble using the spirometer as often as instructed.  Your pain medication is not giving enough relief while using the spirometer.  You develop fever of 100.5 F (38.1 C) or higher. SEEK IMMEDIATE MEDICAL CARE IF:   You cough up bloody sputum that had not been present before.  You develop fever of 102 F (38.9 C) or greater.  You develop worsening pain at or near the incision site. MAKE SURE YOU:   Understand these instructions.  Will watch your condition.  Will get help right away if you are not doing well or get worse. Document Released: 11/13/2006 Document Revised: 09/25/2011 Document Reviewed: 01/14/2007 [facility] Patient Information 2014 [place], [place].   ________________________________________________________________________

## 2015-08-10 ENCOUNTER — Other Ambulatory Visit: Payer: Self-pay

## 2015-08-10 MED ORDER — INSULIN GLARGINE 100 UNIT/ML SOLOSTAR PEN: 50.0000 [IU] | PEN_INJECTOR | Freq: Every day | SUBCUTANEOUS | Status: DC

## 2015-08-11 ENCOUNTER — Encounter (HOSPITAL_COMMUNITY): Payer: Self-pay

## 2015-08-11 ENCOUNTER — Encounter (HOSPITAL_COMMUNITY)
Admission: RE | Admit: 2015-08-11 | Discharge: 2015-08-11 | Disposition: A | Discharge status: Home / Self Care | Payer: Medicare Other | Source: Ambulatory Visit | Attending: Urology | Admitting: Urology

## 2015-08-11 DIAGNOSIS — K219 Gastro-esophageal reflux disease without esophagitis: Secondary | ICD-10-CM | POA: Diagnosis not present

## 2015-08-11 DIAGNOSIS — J449 Chronic obstructive pulmonary disease, unspecified: Secondary | ICD-10-CM | POA: Diagnosis not present

## 2015-08-11 DIAGNOSIS — E119 Type 2 diabetes mellitus without complications: Secondary | ICD-10-CM | POA: Insufficient documentation

## 2015-08-11 DIAGNOSIS — Z01812 Encounter for preprocedural laboratory examination: Secondary | ICD-10-CM | POA: Diagnosis not present

## 2015-08-11 HISTORY — DX: Dependence on supplemental oxygen: Z99.81

## 2015-08-11 HISTORY — DX: Unspecified asthma, uncomplicated: J45.909

## 2015-08-11 HISTORY — DX: Unspecified osteoarthritis, unspecified site: M19.90

## 2015-08-11 HISTORY — DX: Chronic sinusitis, unspecified: J32.9

## 2015-08-11 HISTORY — DX: Chronic kidney disease, unspecified: N18.9

## 2015-08-11 LAB — CBC
HEMATOCRIT: 38.7 % — AB (ref 39.0–52.0)
HEMOGLOBIN: 12.6 g/dL — AB (ref 13.0–17.0)
MCH: 26.5 pg (ref 26.0–34.0)
MCHC: 32.6 g/dL (ref 30.0–36.0)
MCV: 81.5 fL (ref 78.0–100.0)
Platelets: 157 10*3/uL (ref 150–400)
RBC: 4.75 MIL/uL (ref 4.22–5.81)
RDW: 13.8 % (ref 11.5–15.5)
WBC: 5.8 10*3/uL (ref 4.0–10.5)

## 2015-08-11 LAB — BASIC METABOLIC PANEL
ANION GAP: 10 (ref 5–15)
BUN: 43 mg/dL — ABNORMAL HIGH (ref 6–20)
CO2: 24 mmol/L (ref 22–32)
Calcium: 9.4 mg/dL (ref 8.9–10.3)
Chloride: 110 mmol/L (ref 101–111)
Creatinine, Ser: 3.39 mg/dL — ABNORMAL HIGH (ref 0.61–1.24)
GFR calc Af Amer: 20 mL/min — ABNORMAL LOW (ref >60)
GFR, EST NON AFRICAN AMERICAN: 18 mL/min — AB (ref >60)
GLUCOSE: 195 mg/dL — AB (ref 65–99)
POTASSIUM: 4.6 mmol/L (ref 3.5–5.1)
Sodium: 144 mmol/L (ref 135–145)

## 2015-08-11 NOTE — Progress Notes (Signed)
EKG-05/14/15-EPIC  Stress and ECHO- 2014-EPIC  05/14/15- LOV- Cardiology - EPIC

## 2015-08-11 NOTE — Progress Notes (Signed)
BMP done 08/11/2015 faxed via EPIC to Dr Gaynelle Arabian

## 2015-08-11 NOTE — Progress Notes (Signed)
LOV- 08/10/2015 with Dr Velvet Bathe placed on chart.

## 2015-08-11 NOTE — Progress Notes (Signed)
LOV- 06/09/2015- Dr Lowanda Foster- on chart

## 2015-08-11 NOTE — Progress Notes (Signed)
Requested LOV notes from office of Dr Velvet Bathe and Dr Lowanda Foster in [place]

## 2015-08-12 LAB — HEMOGLOBIN A1C
Hgb A1c MFr Bld: 7.1 % — ABNORMAL HIGH (ref 4.8–5.6)
Mean Plasma Glucose: 157 mg/dL

## 2015-08-12 NOTE — Progress Notes (Signed)
HGA1C done 08/11/15 faxed via EPIC to Dr Gaynelle Arabian.
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
## 2015-08-15 MED ORDER — GENTAMICIN SULFATE 40 MG/ML IJ SOLN
120.0000 mg | INTRAVENOUS | Status: AC
  Administered 2015-08-16: 120 mg via INTRAVENOUS
  Filled 2015-08-15: qty 3

## 2015-08-16 ENCOUNTER — Encounter (HOSPITAL_COMMUNITY)
Admission: RE | Disposition: A | Discharge status: Home / Self Care | Payer: Self-pay | Source: Ambulatory Visit | Attending: Urology

## 2015-08-16 ENCOUNTER — Observation Stay (HOSPITAL_COMMUNITY)
Admission: RE | Admit: 2015-08-16 | Discharge: 2015-08-17 | Disposition: A | Discharge status: Home / Self Care | Payer: Medicare Other | Source: Ambulatory Visit | Attending: Urology | Admitting: Urology

## 2015-08-16 ENCOUNTER — Ambulatory Visit (HOSPITAL_COMMUNITY): Payer: Medicare Other | Admitting: Anesthesiology

## 2015-08-16 ENCOUNTER — Encounter (HOSPITAL_COMMUNITY): Payer: Self-pay | Admitting: *Deleted

## 2015-08-16 DIAGNOSIS — N183 Chronic kidney disease, stage 3 (moderate): Secondary | ICD-10-CM | POA: Diagnosis not present

## 2015-08-16 DIAGNOSIS — N529 Male erectile dysfunction, unspecified: Secondary | ICD-10-CM | POA: Diagnosis not present

## 2015-08-16 DIAGNOSIS — E669 Obesity, unspecified: Secondary | ICD-10-CM | POA: Insufficient documentation

## 2015-08-16 DIAGNOSIS — Z87891 Personal history of nicotine dependence: Secondary | ICD-10-CM | POA: Insufficient documentation

## 2015-08-16 DIAGNOSIS — G8929 Other chronic pain: Secondary | ICD-10-CM | POA: Diagnosis not present

## 2015-08-16 DIAGNOSIS — N5201 Erectile dysfunction due to arterial insufficiency: Principal | ICD-10-CM | POA: Insufficient documentation

## 2015-08-16 DIAGNOSIS — M1990 Unspecified osteoarthritis, unspecified site: Secondary | ICD-10-CM | POA: Insufficient documentation

## 2015-08-16 DIAGNOSIS — Z79899 Other long term (current) drug therapy: Secondary | ICD-10-CM | POA: Insufficient documentation

## 2015-08-16 DIAGNOSIS — L988 Other specified disorders of the skin and subcutaneous tissue: Secondary | ICD-10-CM | POA: Diagnosis not present

## 2015-08-16 DIAGNOSIS — K219 Gastro-esophageal reflux disease without esophagitis: Secondary | ICD-10-CM | POA: Insufficient documentation

## 2015-08-16 DIAGNOSIS — J449 Chronic obstructive pulmonary disease, unspecified: Secondary | ICD-10-CM | POA: Insufficient documentation

## 2015-08-16 DIAGNOSIS — Z794 Long term (current) use of insulin: Secondary | ICD-10-CM | POA: Insufficient documentation

## 2015-08-16 DIAGNOSIS — N3289 Other specified disorders of bladder: Secondary | ICD-10-CM | POA: Insufficient documentation

## 2015-08-16 DIAGNOSIS — L905 Scar conditions and fibrosis of skin: Secondary | ICD-10-CM | POA: Diagnosis not present

## 2015-08-16 DIAGNOSIS — E1122 Type 2 diabetes mellitus with diabetic chronic kidney disease: Secondary | ICD-10-CM | POA: Insufficient documentation

## 2015-08-16 DIAGNOSIS — D485 Neoplasm of uncertain behavior of skin: Secondary | ICD-10-CM | POA: Diagnosis not present

## 2015-08-16 DIAGNOSIS — I129 Hypertensive chronic kidney disease with stage 1 through stage 4 chronic kidney disease, or unspecified chronic kidney disease: Secondary | ICD-10-CM | POA: Diagnosis not present

## 2015-08-16 DIAGNOSIS — N486 Induration penis plastica: Secondary | ICD-10-CM | POA: Insufficient documentation

## 2015-08-16 DIAGNOSIS — E291 Testicular hypofunction: Secondary | ICD-10-CM | POA: Diagnosis not present

## 2015-08-16 DIAGNOSIS — J45909 Unspecified asthma, uncomplicated: Secondary | ICD-10-CM | POA: Insufficient documentation

## 2015-08-16 DIAGNOSIS — Z7902 Long term (current) use of antithrombotics/antiplatelets: Secondary | ICD-10-CM | POA: Insufficient documentation

## 2015-08-16 DIAGNOSIS — R800 Isolated proteinuria: Secondary | ICD-10-CM | POA: Diagnosis not present

## 2015-08-16 DIAGNOSIS — Z6841 Body Mass Index (BMI) 40.0 and over, adult: Secondary | ICD-10-CM | POA: Insufficient documentation

## 2015-08-16 DIAGNOSIS — G4733 Obstructive sleep apnea (adult) (pediatric): Secondary | ICD-10-CM | POA: Diagnosis not present

## 2015-08-16 DIAGNOSIS — F4310 Post-traumatic stress disorder, unspecified: Secondary | ICD-10-CM | POA: Insufficient documentation

## 2015-08-16 DIAGNOSIS — E785 Hyperlipidemia, unspecified: Secondary | ICD-10-CM | POA: Insufficient documentation

## 2015-08-16 DIAGNOSIS — N485 Ulcer of penis: Secondary | ICD-10-CM | POA: Diagnosis not present

## 2015-08-16 DIAGNOSIS — Z9981 Dependence on supplemental oxygen: Secondary | ICD-10-CM | POA: Diagnosis not present

## 2015-08-16 HISTORY — PX: PENILE PROSTHESIS IMPLANT: SHX240

## 2015-08-16 LAB — GLUCOSE, CAPILLARY
GLUCOSE-CAPILLARY: 101 mg/dL — AB (ref 65–99)
GLUCOSE-CAPILLARY: 182 mg/dL — AB (ref 65–99)
GLUCOSE-CAPILLARY: 165 mg/dL — AB (ref 65–99)
Glucose-Capillary: 177 mg/dL — ABNORMAL HIGH (ref 65–99)

## 2015-08-16 SURGERY — INSERTION, PENILE PROSTHESIS, INFLATABLE
Anesthesia: General

## 2015-08-16 MED ORDER — CEFAZOLIN SODIUM-DEXTROSE 2-3 GM-% IV SOLR
2.0000 g | INTRAVENOUS | Status: AC
  Administered 2015-08-16: 2 g via INTRAVENOUS

## 2015-08-16 MED ORDER — DIPHENHYDRAMINE HCL 50 MG/ML IJ SOLN: 12.5000 mg | Freq: Four times a day (QID) | INTRAMUSCULAR | Status: DC | PRN

## 2015-08-16 MED ORDER — ROCURONIUM BROMIDE 100 MG/10ML IV SOLN
INTRAVENOUS | Status: DC | PRN
  Administered 2015-08-16 (×2): 10 mg via INTRAVENOUS
  Administered 2015-08-16: 40 mg via INTRAVENOUS
  Administered 2015-08-16 (×2): 10 mg via INTRAVENOUS

## 2015-08-16 MED ORDER — INSULIN ASPART 100 UNIT/ML ~~LOC~~ SOLN
0.0000 [IU] | SUBCUTANEOUS | Status: DC
  Administered 2015-08-16 (×2): 3 [IU] via SUBCUTANEOUS
  Administered 2015-08-17: 2 [IU] via SUBCUTANEOUS

## 2015-08-16 MED ORDER — ONDANSETRON HCL 4 MG/2ML IJ SOLN: 4.0000 mg | INTRAMUSCULAR | Status: DC | PRN

## 2015-08-16 MED ORDER — HYDROMORPHONE HCL 1 MG/ML IJ SOLN
0.2500 mg | INTRAMUSCULAR | Status: DC | PRN
  Administered 2015-08-16 (×4): 0.5 mg via INTRAVENOUS

## 2015-08-16 MED ORDER — BELLADONNA ALKALOIDS-OPIUM 16.2-60 MG RE SUPP
1.0000 | Freq: Four times a day (QID) | RECTAL | Status: DC | PRN
  Administered 2015-08-16: 1 via RECTAL
  Filled 2015-08-16: qty 1

## 2015-08-16 MED ORDER — LIDOCAINE HCL (CARDIAC) 20 MG/ML IV SOLN
INTRAVENOUS | Status: AC
  Filled 2015-08-16: qty 5

## 2015-08-16 MED ORDER — LIDOCAINE HCL (CARDIAC) 20 MG/ML IV SOLN
INTRAVENOUS | Status: DC | PRN
  Administered 2015-08-16: 50 mg via INTRAVENOUS

## 2015-08-16 MED ORDER — SUGAMMADEX SODIUM 200 MG/2ML IV SOLN
INTRAVENOUS | Status: DC | PRN
  Administered 2015-08-16: 200 mg via INTRAVENOUS

## 2015-08-16 MED ORDER — PROMETHAZINE HCL 25 MG/ML IJ SOLN: 6.2500 mg | INTRAMUSCULAR | Status: DC | PRN

## 2015-08-16 MED ORDER — BELLADONNA ALKALOIDS-OPIUM 16.2-60 MG RE SUPP
RECTAL | Status: AC
  Filled 2015-08-16: qty 1

## 2015-08-16 MED ORDER — BUPIVACAINE HCL (PF) 0.25 % IJ SOLN
INTRAMUSCULAR | Status: AC
  Filled 2015-08-16: qty 30

## 2015-08-16 MED ORDER — HYDROMORPHONE HCL 1 MG/ML IJ SOLN
INTRAMUSCULAR | Status: DC | PRN
  Administered 2015-08-16 (×2): 1 mg via INTRAVENOUS

## 2015-08-16 MED ORDER — MORPHINE SULFATE (PF) 2 MG/ML IV SOLN
2.0000 mg | INTRAVENOUS | Status: DC | PRN
  Administered 2015-08-16: 2 mg via INTRAVENOUS
  Administered 2015-08-16: 4 mg via INTRAVENOUS
  Administered 2015-08-16: 2 mg via INTRAVENOUS
  Administered 2015-08-17: 4 mg via INTRAVENOUS
  Filled 2015-08-16: qty 1
  Filled 2015-08-16 (×2): qty 2
  Filled 2015-08-16: qty 1

## 2015-08-16 MED ORDER — SUGAMMADEX SODIUM 200 MG/2ML IV SOLN
INTRAVENOUS | Status: AC
  Filled 2015-08-16: qty 2

## 2015-08-16 MED ORDER — SODIUM CHLORIDE 0.45 % IV SOLN
INTRAVENOUS | Status: DC
  Administered 2015-08-16 (×2): via INTRAVENOUS

## 2015-08-16 MED ORDER — BUDESONIDE-FORMOTEROL FUMARATE 160-4.5 MCG/ACT IN AERO: 2.0000 | INHALATION_SPRAY | Freq: Every day | RESPIRATORY_TRACT | Status: DC | PRN

## 2015-08-16 MED ORDER — HYDROCODONE-ACETAMINOPHEN 5-325 MG PO TABS
1.0000 | ORAL_TABLET | ORAL | Status: DC | PRN
  Administered 2015-08-16: 1 via ORAL
  Administered 2015-08-17: 2 via ORAL
  Filled 2015-08-16: qty 2
  Filled 2015-08-16: qty 1

## 2015-08-16 MED ORDER — SUCCINYLCHOLINE CHLORIDE 20 MG/ML IJ SOLN
INTRAMUSCULAR | Status: DC | PRN
  Administered 2015-08-16: 100 mg via INTRAVENOUS

## 2015-08-16 MED ORDER — SODIUM CHLORIDE 0.9 % IR SOLN
Status: DC | PRN
  Administered 2015-08-16: 500 mL

## 2015-08-16 MED ORDER — MIDAZOLAM HCL 2 MG/2ML IJ SOLN
INTRAMUSCULAR | Status: AC
  Filled 2015-08-16: qty 2

## 2015-08-16 MED ORDER — DEXAMETHASONE SODIUM PHOSPHATE 10 MG/ML IJ SOLN
INTRAMUSCULAR | Status: DC | PRN
  Administered 2015-08-16: 10 mg via INTRAVENOUS

## 2015-08-16 MED ORDER — LACTATED RINGERS IV SOLN
INTRAVENOUS | Status: DC | PRN
  Administered 2015-08-16 (×2): via INTRAVENOUS

## 2015-08-16 MED ORDER — CEFAZOLIN SODIUM-DEXTROSE 2-3 GM-% IV SOLR
INTRAVENOUS | Status: AC
  Filled 2015-08-16: qty 50

## 2015-08-16 MED ORDER — DOCUSATE SODIUM 100 MG PO CAPS: 100.0000 mg | ORAL_CAPSULE | Freq: Two times a day (BID) | ORAL | Status: DC

## 2015-08-16 MED ORDER — AMOXICILLIN-POT CLAVULANATE 875-125 MG PO TABS: 1.0000 | ORAL_TABLET | Freq: Two times a day (BID) | ORAL | Status: DC

## 2015-08-16 MED ORDER — DIPHENHYDRAMINE HCL 12.5 MG/5ML PO ELIX: 12.5000 mg | ORAL_SOLUTION | Freq: Four times a day (QID) | ORAL | Status: DC | PRN

## 2015-08-16 MED ORDER — SENNA 8.6 MG PO TABS: 1.0000 | ORAL_TABLET | Freq: Every day | ORAL | Status: DC

## 2015-08-16 MED ORDER — ALBUTEROL SULFATE (2.5 MG/3ML) 0.083% IN NEBU: 3.0000 mL | INHALATION_SOLUTION | RESPIRATORY_TRACT | Status: DC | PRN

## 2015-08-16 MED ORDER — HYDROMORPHONE HCL 1 MG/ML IJ SOLN
INTRAMUSCULAR | Status: AC
  Filled 2015-08-16: qty 1

## 2015-08-16 MED ORDER — MIDAZOLAM HCL 5 MG/5ML IJ SOLN
INTRAMUSCULAR | Status: DC | PRN
  Administered 2015-08-16: 2 mg via INTRAVENOUS

## 2015-08-16 MED ORDER — DEXAMETHASONE SODIUM PHOSPHATE 10 MG/ML IJ SOLN
INTRAMUSCULAR | Status: AC
  Filled 2015-08-16: qty 1

## 2015-08-16 MED ORDER — SODIUM CHLORIDE 0.9% FLUSH: 3.0000 mL | Freq: Two times a day (BID) | INTRAVENOUS | Status: DC

## 2015-08-16 MED ORDER — ONDANSETRON HCL 4 MG/2ML IJ SOLN
INTRAMUSCULAR | Status: DC | PRN
  Administered 2015-08-16: 4 mg via INTRAVENOUS

## 2015-08-16 MED ORDER — PROPOFOL 10 MG/ML IV BOLUS
INTRAVENOUS | Status: DC | PRN
  Administered 2015-08-16: 180 mg via INTRAVENOUS

## 2015-08-16 MED ORDER — LABETALOL HCL 5 MG/ML IV SOLN
INTRAVENOUS | Status: AC
  Filled 2015-08-16: qty 4

## 2015-08-16 MED ORDER — BUPIVACAINE HCL (PF) 0.25 % IJ SOLN
INTRAMUSCULAR | Status: DC | PRN
  Administered 2015-08-16: 3 mL

## 2015-08-16 MED ORDER — PANTOPRAZOLE SODIUM 40 MG PO TBEC
40.0000 mg | DELAYED_RELEASE_TABLET | Freq: Every day | ORAL | Status: DC
  Administered 2015-08-16 – 2015-08-17 (×2): 40 mg via ORAL
  Filled 2015-08-16 (×2): qty 1

## 2015-08-16 MED ORDER — ROCURONIUM BROMIDE 100 MG/10ML IV SOLN
INTRAVENOUS | Status: AC
  Filled 2015-08-16: qty 1

## 2015-08-16 MED ORDER — LABETALOL HCL 5 MG/ML IV SOLN
INTRAVENOUS | Status: DC | PRN
  Administered 2015-08-16 (×3): 5 mg via INTRAVENOUS

## 2015-08-16 MED ORDER — HYDROCODONE-ACETAMINOPHEN 5-325 MG PO TABS: 1.0000 | ORAL_TABLET | Freq: Four times a day (QID) | ORAL | Status: DC | PRN

## 2015-08-16 MED ORDER — SODIUM CHLORIDE 0.9% FLUSH: 3.0000 mL | INTRAVENOUS | Status: DC | PRN

## 2015-08-16 MED ORDER — AMOXICILLIN-POT CLAVULANATE 500-125 MG PO TABS
1.0000 | ORAL_TABLET | Freq: Two times a day (BID) | ORAL | Status: DC
  Administered 2015-08-16 – 2015-08-17 (×2): 500 mg via ORAL
  Filled 2015-08-16 (×3): qty 1

## 2015-08-16 MED ORDER — HYDROMORPHONE HCL 2 MG/ML IJ SOLN
INTRAMUSCULAR | Status: AC
  Filled 2015-08-16: qty 1

## 2015-08-16 MED ORDER — AMLODIPINE BESYLATE 10 MG PO TABS
10.0000 mg | ORAL_TABLET | Freq: Every day | ORAL | Status: DC
  Administered 2015-08-16 – 2015-08-17 (×2): 10 mg via ORAL
  Filled 2015-08-16 (×2): qty 1

## 2015-08-16 MED ORDER — LACTATED RINGERS IV SOLN: INTRAVENOUS | Status: DC

## 2015-08-16 MED ORDER — ONDANSETRON HCL 4 MG/2ML IJ SOLN
INTRAMUSCULAR | Status: AC
  Filled 2015-08-16: qty 2

## 2015-08-16 MED ORDER — SODIUM CHLORIDE 0.9 % IV SOLN: 250.0000 mL | INTRAVENOUS | Status: DC | PRN

## 2015-08-16 MED ORDER — FENTANYL CITRATE (PF) 250 MCG/5ML IJ SOLN
INTRAMUSCULAR | Status: AC
  Filled 2015-08-16: qty 5

## 2015-08-16 MED ORDER — SIMVASTATIN 20 MG PO TABS
20.0000 mg | ORAL_TABLET | Freq: Every morning | ORAL | Status: DC
  Administered 2015-08-16 – 2015-08-17 (×2): 20 mg via ORAL
  Filled 2015-08-16 (×2): qty 1

## 2015-08-16 MED ORDER — PROPOFOL 10 MG/ML IV BOLUS
INTRAVENOUS | Status: AC
  Filled 2015-08-16: qty 20

## 2015-08-16 MED ORDER — SODIUM CHLORIDE 0.9 % IR SOLN
Status: AC
  Filled 2015-08-16 (×2): qty 1

## 2015-08-16 MED ORDER — DOCUSATE SODIUM 100 MG PO CAPS
100.0000 mg | ORAL_CAPSULE | Freq: Two times a day (BID) | ORAL | Status: DC
  Administered 2015-08-16 – 2015-08-17 (×2): 100 mg via ORAL
  Filled 2015-08-16 (×2): qty 1

## 2015-08-16 MED ORDER — FENTANYL CITRATE (PF) 100 MCG/2ML IJ SOLN
INTRAMUSCULAR | Status: DC | PRN
  Administered 2015-08-16 (×5): 50 ug via INTRAVENOUS

## 2015-08-16 SURGICAL SUPPLY — 49 items
APL SKNCLS STERI-STRIP NONHPOA (GAUZE/BANDAGES/DRESSINGS)
BAG URINE DRAINAGE (UROLOGICAL SUPPLIES) ×2 IMPLANT
BANDAGE COBAN STERILE 2 (GAUZE/BANDAGES/DRESSINGS) IMPLANT
BENZOIN TINCTURE PRP APPL 2/3 (GAUZE/BANDAGES/DRESSINGS) ×1 IMPLANT
BLADE HEX COATED 2.75 (ELECTRODE) ×2 IMPLANT
BNDG GAUZE ELAST 4 BULKY (GAUZE/BANDAGES/DRESSINGS) ×1 IMPLANT
BRIEF STRETCH FOR OB PAD LRG (UNDERPADS AND DIAPERS) ×1 IMPLANT
CATH FOLEY 2WAY SLVR  5CC 16FR (CATHETERS) ×1
CATH FOLEY 2WAY SLVR 5CC 16FR (CATHETERS) ×1 IMPLANT
COVER MAYO STAND STRL (DRAPES) ×2 IMPLANT
DISSECTOR ROUND CHERRY 3/8 STR (MISCELLANEOUS) ×2 IMPLANT
DRAPE LAPAROTOMY T 102X78X121 (DRAPES) ×2 IMPLANT
DRAPE UTILITY 15X26 (DRAPE) ×2 IMPLANT
DRSG TEGADERM 4X4.75 (GAUZE/BANDAGES/DRESSINGS) ×1 IMPLANT
ELECT NDL TIP 2.8 STRL (NEEDLE) IMPLANT
ELECT NEEDLE TIP 2.8 STRL (NEEDLE) ×2 IMPLANT
GAUZE SPONGE 4X4 12PLY STRL (GAUZE/BANDAGES/DRESSINGS) ×1 IMPLANT
GLOVE BIOGEL M STRL SZ7.5 (GLOVE) ×2 IMPLANT
GOWN STRL REUS W/TWL XL LVL3 (GOWN DISPOSABLE) ×2 IMPLANT
KIT BASIN OR (CUSTOM PROCEDURE TRAY) ×2 IMPLANT
KIT TITAN ASSEMBLY (Erectile Restoration) ×1 IMPLANT
KIT TITAN ASSEMBLY STANDARD (Erectile Restoration) ×1 IMPLANT
KIT TITAN ASSEMBLY STD (Erectile Restoration) IMPLANT
LIQUID BAND (GAUZE/BANDAGES/DRESSINGS) ×1 IMPLANT
NEEDLE HYPO 22GX1.5 SAFETY (NEEDLE) ×1 IMPLANT
NS IRRIG 1000ML POUR BTL (IV SOLUTION) ×2 IMPLANT
PACK GENERAL/GYN (CUSTOM PROCEDURE TRAY) ×2 IMPLANT
PLUG CATH AND CAP STER (CATHETERS) ×2 IMPLANT
RESERVOIR TITAN 12.5CC W/VLV ×1 IMPLANT
RETRACTOR WILSON SYSTEM (INSTRUMENTS) IMPLANT
SCRUB PCMX 4 OZ (MISCELLANEOUS) ×1 IMPLANT
SET TITAN CYLINDER SCROTAL 18C ×1 IMPLANT
SOL PREP POV-IOD 4OZ 10% (MISCELLANEOUS) ×2 IMPLANT
SOL PREP PROV IODINE SCRUB 4OZ (MISCELLANEOUS) ×2 IMPLANT
SPONGE LAP 4X18 X RAY DECT (DISPOSABLE) IMPLANT
STAPLER VISISTAT 35W (STAPLE) ×1 IMPLANT
STRIP CLOSURE SKIN 1/2X4 (GAUZE/BANDAGES/DRESSINGS) ×1 IMPLANT
SURGILUBE 3G PEEL PACK STRL (MISCELLANEOUS) ×10 IMPLANT
SUT MNCRL AB 4-0 PS2 18 (SUTURE) ×1 IMPLANT
SUT MON AB 4-0 RB1 27 (SUTURE) ×1 IMPLANT
SUT VIC AB 2-0 UR6 27 (SUTURE) ×12 IMPLANT
SUT VIC AB 3-0 54XBRD REEL (SUTURE) ×1 IMPLANT
SUT VIC AB 3-0 BRD 54 (SUTURE) ×2
SYR 20CC LL (SYRINGE) ×2 IMPLANT
SYR 50ML LL SCALE MARK (SYRINGE) ×4 IMPLANT
SYR CONTROL 10ML LL (SYRINGE) ×1 IMPLANT
TOWEL OR 17X26 10 PK STRL BLUE (TOWEL DISPOSABLE) ×2 IMPLANT
TOWEL OR NON WOVEN STRL DISP B (DISPOSABLE) ×2 IMPLANT
WATER STERILE IRR 1500ML POUR (IV SOLUTION) ×2 IMPLANT

## 2015-08-16 NOTE — Anesthesia Procedure Notes (Signed)
Procedure Name: Intubation Date/Time: 08/16/2015 9:43 AM Performed by: Noralyn Pick D Pre-anesthesia Checklist: Patient identified, Emergency Drugs available, Suction available and Patient being monitored Patient Re-evaluated:Patient Re-evaluated prior to inductionOxygen Delivery Method: Circle System Utilized Preoxygenation: Pre-oxygenation with 100% oxygen Intubation Type: IV induction Ventilation: Mask ventilation without difficulty Laryngoscope Size: Mac and 4 Grade View: Grade III Tube type: Oral Tube size: 7.5 mm Number of attempts: 1 Airway Equipment and Method: Stylet and Oral airway Placement Confirmation: ETT inserted through vocal cords under direct vision,  positive ETCO2 and breath sounds checked- equal and bilateral Secured at: 22 cm Tube secured with: Tape Dental Injury: Teeth and Oropharynx as per pre-operative assessment

## 2015-08-16 NOTE — Progress Notes (Signed)
PHARMACY NOTE -  ANTIBIOTIC RENAL DOSE ADJUSTMENT   Request received for Pharmacy to assist with antibiotic renal dose adjustment.  Patient has been initiated on Augmentin 875 mg BID s/p penile prosthesis, excision of ulcerative lesion. SCr 3.39, estimated CrCl 24 ml/min  Plan: Reduce Augmentin to 500 mg BID.  Hershal Coria, PharmD, BCPS Pager: [phone] 08/16/2015 2:27 PM

## 2015-08-16 NOTE — Transfer of Care (Signed)
Immediate Anesthesia Transfer of Care Note  Patient: [name] [name]  Procedure(s) Performed: Procedure(s): PENILE PROTHESIS INFLATABLE, three piece, Excisional biopsy of Penile ulcer, Penile molding (N/A)  Patient Location: PACU  Anesthesia Type:General  Level of Consciousness: awake, alert  and oriented  Airway & Oxygen Therapy: Patient Spontanous Breathing and Patient connected to face mask oxygen  Post-op Assessment: Report given to RN  Post vital signs: Reviewed and stable  Last Vitals:  Filed Vitals:   08/16/15 0659  BP: 155/93  Pulse: 98  Temp: 36.7 C  Resp: 18    Complications: No apparent anesthesia complications

## 2015-08-16 NOTE — Interval H&P Note (Signed)
History and Physical Interval Note:  08/16/2015 9:01 AM  [name] [name]  has presented today for surgery, with the diagnosis of organic sexual dysfunction  The various methods of treatment have been discussed with the patient and family. After consideration of risks, benefits and other options for treatment, the patient has consented to  Procedure(s): Lawrence, three piece (N/A) as a surgical intervention .  The patient's history has been reviewed, patient examined, no change in status, stable for surgery.  I have reviewed the patient's chart and labs.  Questions were answered to the patient's satisfaction.   Note: pt is an insulin dependent Diabetic, with CKD, on additional medication ( Tragenta) for his DM. Recently cleared of URI. Now ready for Coloplast 3-piece prosthesis placement. Risks and complications assesed and discussed completely in office pre-operatively. Pt will be admitted for 23 hr observation today.   Shalom Ware I Hermelinda Diegel

## 2015-08-16 NOTE — Progress Notes (Signed)
Urology Progress Note  Day of Surgery   Subjective: Post op dk:      No acute urologic events overnight. Ambulation:   negative Flatus:    negative Bowel movement  negative  Pain: some relief . Bladder spasm.  Objective:  Blood pressure 166/91, pulse 70, temperature 97.6 F (36.4 C), temperature source Oral, resp. rate 18, height 5\' 4"  (1.626 m), weight 107.502 kg (237 lb), SpO2 95 %.  Physical Exam:  General:  No acute distress, awake Extremities: extremities normal, atraumatic, no cyanosis or edema Genitourinary:  Normal s-p site.  Foley: clear.       No results for input(s): HGB, WBC, PLT in the last 72 hours.  No results for input(s): NA, K, CL, CO2, BUN, CREATININE, CALCIUM, GFRNONAA, GFRAA in the last 72 hours.  Invalid input(s): MAGNESIUM   No results for input(s): INR, APTT in the last 72 hours.  Invalid input(s): PT   Invalid input(s): ABG  Assessment/Plan:  Continue any current medications. Wound care discussed. Note: discussed excisional biopsy of ulcer of the ventral foreskin. Pt notes that he felt "bump" several days ago, an d"picked" area until it"popped" and he noted " some bleeding". He didn't think it was important enough to notify me about pre-operatively. I have explained that  There  Was no cancer, but chronic inflammation, and that I hoped that it didn't cause infection of his prosthesis.

## 2015-08-16 NOTE — Anesthesia Postprocedure Evaluation (Signed)
Anesthesia Post Note  Patient: [name] [name]  Procedure(s) Performed: Procedure(s) (LRB): PENILE PROTHESIS INFLATABLE, three piece, Excisional biopsy of Penile ulcer, Penile molding (N/A)  Patient location during evaluation: PACU Anesthesia Type: General Level of consciousness: awake and alert Pain management: pain level controlled Vital Signs Assessment: post-procedure vital signs reviewed and stable Respiratory status: spontaneous breathing, nonlabored ventilation, respiratory function stable and patient connected to nasal cannula oxygen Cardiovascular status: blood pressure returned to baseline and stable Postop Assessment: no signs of nausea or vomiting Anesthetic complications: no    Last Vitals:  Filed Vitals:   08/16/15 1305 08/16/15 1315  BP:  150/97  Pulse: 79 76  Temp:    Resp: 10 12    Last Pain:  Filed Vitals:   08/16/15 1317  PainSc: 9                  Kiasia Chou S

## 2015-08-16 NOTE — Anesthesia Preprocedure Evaluation (Addendum)
Anesthesia Evaluation  Patient identified by MRN, date of birth, ID band Patient awake    Reviewed: Allergy & Precautions, NPO status , Patient's Chart, lab work & pertinent test results  Airway Mallampati: II  TM Distance: >3 FB Neck ROM: Full    Dental no notable dental hx.    Pulmonary COPD, former smoker,  Uses home O2   Pulmonary exam normal breath sounds clear to auscultation       Cardiovascular hypertension, Pt. on medications Normal cardiovascular exam Rhythm:Regular Rate:Normal     Neuro/Psych negative neurological ROS  negative psych ROS   GI/Hepatic negative GI ROS, Neg liver ROS,   Endo/Other  diabetesMorbid obesity  Renal/GU Renal InsufficiencyRenal disease  negative genitourinary   Musculoskeletal negative musculoskeletal ROS (+)   Abdominal   Peds negative pediatric ROS (+)  Hematology negative hematology ROS (+)   Anesthesia Other Findings   Reproductive/Obstetrics negative OB ROS                            Anesthesia Physical Anesthesia Plan  ASA: IV  Anesthesia Plan: General   Post-op Pain Management:    Induction: Intravenous  Airway Management Planned: LMA  Additional Equipment:   Intra-op Plan:   Post-operative Plan: Extubation in OR  Informed Consent: I have reviewed the patients History and Physical, chart, labs and discussed the procedure including the risks, benefits and alternatives for the proposed anesthesia with the patient or authorized representative who has indicated his/her understanding and acceptance.   Dental advisory given  Plan Discussed with: CRNA and Surgeon  Anesthesia Plan Comments:         Anesthesia Quick Evaluation

## 2015-08-16 NOTE — Op Note (Addendum)
Post-operative Diagnosis:  1. Organic-erectile dysfunction 2. Benign penile ulceration 3. Peyronie's disease with ~20 degree ventral curvature  Procedure and Anesthesia:  Procedure(s) and Anesthesia Type:    * PENILE PROTHESIS INFLATABLE, three piece, Excisional biopsy of Penile ulcer, Penile molding - General  1. Excisional Biopsy of penile lesion (0.5cm) 2.Implantation of a 3-piece inflatable penile prosthesis with a Chemical engineer device through a penoscrotal approach. 3. Penile modelling  Surgeon: Surgeon(s) and Role:    * Carolan Clines, MD - Primary   Resident:  Star Age, MD  EBL: Minimal  IVF: See anesthesia record  UOP: See anesthesia record  Drains:  16 French foley.   Implants:  Implant Name Type Inv. Item Serial No. Manufacturer Lot No. LRB No. Used  KIT TITAN ASSEMBLY - [id number] Erectile Restoration KIT TITAN ASSEMBLY  MENTOR [id number] N/A 1  TITAN RESERCOIR ECLD 12.5CC - [id number]  TITAN RESERCOIR ECLD 12.5CC [id number] COLOPLAST [id number] N/A 1  SET TITAN CYLINDER SCROTAL 18C - [id number]   SET TITAN CYLINDER SCROTAL 18C [id number] COLOPLAST   N/A 1     Specimens: Penile skin lesion for frozen section and permanent pathology  Complications: * No complications entered in OR log *  Indications for Surgery: 65 y.o. male with severe erectile dysfunction.  He failed all conservative treatment options.  He was extensively counseled for a penile implant, and risks, benefits and alternatives were discussed.  Informed consent was obtained.    Findings:  1. Penile ulceration measuring 0.5 cm at the LEFT lateral subcoronal area excised and sent for frozen which came back as benign ulceration/inflammatory tissue. 2.Excellent cosmetic outcome on the penile implant. A Coloplast Titan Touch device was used. A 125 mL cloverleaf reservoir was placed on the patient's RIGHT side, anterior to transversalis fascia and directed cephalad, and filled with 75 mL of sterile  injectable saline. The corpora measured 9.5 cm (RIGHT) and 10 cm (LEFT) proximal and 11 cm distal bilaterally. An 18 cm cylinder implant with 3 cm rear tip extenders was placed.  Procedure Details:  After informed consent was obtained, the patient was correctly identified in the preoperative holding area and transported back to the operating room. A timeout was called and general anesthesia was induced. Preoperative antibiotics were administered. A 5 minute Chlorhexidine pre-scrub was performed after carefully clipping the groin and scrotum. Two separate ChloraPrep sticks were then used to prep prior to draping in standard fashion. A timeout was called again.  We first turned our attention to the ulcerated lesion on the RIGHT lateral subcoronal tissue. Lesion did not feel fixed to palpation but was certainly abnormal. It was excised in standard fashion via an elliptical incision. The base was freed with Metzenbaum scissors. The specimen was sent for frozen section to pathology who confirmed it was benign, inflammatory/ulcerated tissue. The defect was closed in 2-layers using 4-0 monocryl.  At this point we proceeded with our device implantation. A 16 French Foley catheter was placed into the bladder without difficulty. A [place] retractor was assembled and the penis was retracted cephalad. A 4 cm penoscrotal incision was made and carried down through dartos fascia with electrocautery. A Henry sweep maneuver was made over the tunica albuginea of the corpora cavernosa bilaterally. Midline urethral attachments were taken down sharply. Care was taken throughout this time for careful hemostasis. The hooks for the [facility] retractor were placed. 2-0 Vicryl sutures were preplaced at the areas of the corporotomies, which were then made with a #10 blade. The proximal  and distal corpora were dilated with a Furlow device and measured to be 9.5 cm (RIGHT), 10 cm (LEFT) proximal and 11 cm distally (bilaterally). There  was no resistance as the Furlow passed into the tip of the corpora. There was no evidence of perforation or crossover. The location for the reservoir was chosen to be on the patient's RIGHT side. The long nasal speculum was passed medial to the spermatic cord and just inside the external ring. The posterior wall of the inguinal canal comprised of the transversalis fascia was identified and the long nasal speculum, which was then turned cephalad and spread up anterior to the transversalis fascia creating a place for the reservoir. A 125 mL cloverleaf reservoir was prepared on the back table. All implant devices were dipped in rifampin and gentamicin prior to ever being touched. All air was removed out of the reservoir before it was placed in our reservoir location. The defect was closed with vicryl suture in figure of eight fashion using 2-0 Vicryl. The reservoir was filled with 75 mL of sterile injectable saline and then clamped. An 18 cm Titan Touch zero degree implant was prepared on the back table and 3 cm rear tip extenders were placed. The cylinders and pump were then brought into the field. The Furlow and Lanny Hurst needle were used to pass each cylinder out distally in standard fashion. The proximal cylinders were seated nicely. Before connecting the reservoir to the pump and before closing the corporotomies, a syringe of sterile injectable saline was attached to the pump and the device was tested. The cylinders were equal and symmetric bilaterally without evidence of crossover. This fluid was then removed from the cylinders and the corporotomies were closed with the preplaced 2-0 Vicryl. The LEFT corporotomy needed an additional figure of eight Vicryl suture which was placed after the device was deflated. The space for the pump was made in the posterior RIGHT hemi-scrotum as a subdartos pouch. The pump and reservoir were then connected using the accessory kit after excess tubing was tailored to fit. The device  was tested. The cylinders were well seated and with an excellent result. Copious irrigation with antibiotic solution was performed. Excellent hemostasis was achieved.  The dartos was closed in two layers of 2-0 Vicryl. The skin was closed in a running horizontal mattress with 4-0 Monocryl.  We then turned our attention to penile modelling, which was performed in standard fashion with modelling position being gently held for 30 seconds x 3 sets. Residual curvature was minimal. Liquaband was placed on the wound, and mesh panties with Kerlix fluffs were placed. The patient was awakened from anesthesia having tolerated the procedure well and transported to the PACU in stable condition.    Teaching Physician Attestation: Dr. Gaynelle Arabian was present and scrubbed for the entirety of the procedure  Pietro Cassis. Ottis Stain, MD Resident [facility] Department of Urologic Surgery/[facility]   Urology Attending Note: I was present for, and participated in, all aspects of this patient's surgical care.

## 2015-08-17 DIAGNOSIS — N485 Ulcer of penis: Secondary | ICD-10-CM | POA: Diagnosis not present

## 2015-08-17 DIAGNOSIS — N486 Induration penis plastica: Secondary | ICD-10-CM | POA: Diagnosis not present

## 2015-08-17 DIAGNOSIS — G4733 Obstructive sleep apnea (adult) (pediatric): Secondary | ICD-10-CM | POA: Diagnosis not present

## 2015-08-17 DIAGNOSIS — E291 Testicular hypofunction: Secondary | ICD-10-CM | POA: Diagnosis not present

## 2015-08-17 DIAGNOSIS — N5201 Erectile dysfunction due to arterial insufficiency: Secondary | ICD-10-CM | POA: Diagnosis not present

## 2015-08-17 DIAGNOSIS — J449 Chronic obstructive pulmonary disease, unspecified: Secondary | ICD-10-CM | POA: Diagnosis not present

## 2015-08-17 LAB — HEMOGLOBIN AND HEMATOCRIT, BLOOD
HCT: 34 % — ABNORMAL LOW (ref 39.0–52.0)
Hemoglobin: 11.1 g/dL — ABNORMAL LOW (ref 13.0–17.0)

## 2015-08-17 LAB — BASIC METABOLIC PANEL
ANION GAP: 8 (ref 5–15)
BUN: 43 mg/dL — ABNORMAL HIGH (ref 6–20)
CHLORIDE: 106 mmol/L (ref 101–111)
CO2: 29 mmol/L (ref 22–32)
Calcium: 9.1 mg/dL (ref 8.9–10.3)
Creatinine, Ser: 3.17 mg/dL — ABNORMAL HIGH (ref 0.61–1.24)
GFR calc non Af Amer: 19 mL/min — ABNORMAL LOW (ref >60)
GFR, EST AFRICAN AMERICAN: 22 mL/min — AB (ref >60)
GLUCOSE: 162 mg/dL — AB (ref 65–99)
Potassium: 4.7 mmol/L (ref 3.5–5.1)
Sodium: 143 mmol/L (ref 135–145)

## 2015-08-17 LAB — GLUCOSE, CAPILLARY
Glucose-Capillary: 129 mg/dL — ABNORMAL HIGH (ref 65–99)
Glucose-Capillary: 150 mg/dL — ABNORMAL HIGH (ref 65–99)
Glucose-Capillary: 185 mg/dL — ABNORMAL HIGH (ref 65–99)

## 2015-08-17 MED ORDER — OXYCODONE-ACETAMINOPHEN 5-325 MG PO TABS: 1.0000 | ORAL_TABLET | ORAL | Status: DC | PRN

## 2015-08-17 NOTE — Progress Notes (Signed)
Foley d/c'd as ordered, toel well, ambulated to BR samml amt of urine noted. Will cont to monitor. SRP, RN

## 2015-08-17 NOTE — Discharge Summary (Signed)
Date of admission: 08/16/2015  Date of discharge: 08/17/2015  Admission diagnosis: Refractory erectile dysfunction  Discharge diagnosis: Penile ulcer, refractory erectile dysfunction, Peyronie's disease  Secondary diagnoses:  Past Medical History  Diagnosis Date  . COPD (chronic obstructive pulmonary disease) ([place])   . Sinusitis   . PUD (peptic ulcer disease)     remote, reports f/u EGD about 8 years ago unremarkable   . Diabetes ([place])   . GERD (gastroesophageal reflux disease)   . Hyperlipidemia   . Hypertension   . Post traumatic stress disorder   . Bell palsy   . Obesity     Trun[place]  . Heart disease     family history  . Hepatomegaly     noted on noncontrast CT 2015  . Vitamin D deficiency   . Chronic back pain   . Chronic shoulder pain   . Chronic knee pain   . Chronic pain   . Sleep apnea     cpap - settings at 4   . Shortness of breath dyspnea     with exertion   . Arthritis   . On home oxygen therapy     uses 2l when is going somewhere per patient   . Chronic kidney disease     stage 4 per office visit note of Dr Lowanda Foster on 05/2015   . Chronic sinusitis   . Reactive airway disease     related to exposure to chemical during 9/11     History and Physical: For full details, please see admission history and physical. Briefly, [name] [name] is a 65 y.o. year old patient with the above medical problems admitted for planned placement of 3-piece inflatable penile prosthesis for refractory impotence.  Please see H&P for full details.  Hospital Course:   Patient underwent placement of 3-piece inflatable penile prosthesis with modelling and excision of incidentally found penile lesion on 08/16/15. He tolerated the procedure well and was transferred to the post-surgical floor. His diet was slowly advanced, his foley catheter was removed, he device was left deactivated, partially inflated and his pain was well controlled.   He was discharged on POD 1 after completing a  successful voiding trial. He will follow-up in clinic as scheduled and will be discharged on pain medications, stool softeners and 10 days of Aumgentin.  Laboratory values:  Recent Labs  08/17/15 0527  HGB 11.1*  HCT 34.0*    Recent Labs  08/17/15 0527  CREATININE 3.17*   BP 168/91 mmHg  Pulse 75  Temp(Src) 97.6 F (36.4 C) (Oral)  Resp 18  Ht 5\' 4"  (1.626 m)  Wt 107.502 kg (237 lb)  BMI 40.66 kg/m2  SpO2 98%   Physical Exam:  Vital signs in last 24 hours: Temp:  [97.6 F (36.4 C)-98.9 F (37.2 C)] 97.6 F (36.4 C) (01/31 0505) Pulse Rate:  [70-83] 75 (01/31 0505) Resp:  [10-18] 18 (01/31 0505) BP: (150-181)/(91-103) 168/91 mmHg (01/31 0505) SpO2:  [95 %-100 %] 98 % (01/31 0505) Weight:  [107.502 kg (237 lb)] 107.502 kg (237 lb) (01/30 0721) Constitutional:  Alert and oriented, No acute distress Cardiovascular: Regular rate and rhythm, No JVD Respiratory: Normal respiratory effort, SORA GI: Abdomen is soft, nontender, nondistended, no abdominal masses Genitourinary: No CVAT. Normal male phallus, testes are descended bilaterally and non-tender and without masses, scrotum is normal in appearance without lesions or masses, perineum is normal on inspection. Incisions healing well, device 50% inflated. Pump palpable in scrotum. Rectal: deferred Neurologic: Grossly intact, no  focal deficits Psychiatric: Normal mood and affect   Disposition: Home  Discharge instruction: The patient was instructed to be ambulatory but told to refrain from heavy lifting, strenuous activity, or driving.   Discharge medications:    Medication List    STOP taking these medications        clopidogrel 75 MG tablet  Commonly known as:  PLAVIX      TAKE these medications        amLODipine 10 MG tablet  Commonly known as:  NORVASC  Take 10 mg by mouth daily.     amoxicillin-clavulanate 875-125 MG tablet  Commonly known as:  AUGMENTIN  Take 1 tablet by mouth 2 (two) times daily.   Start taking on:  08/26/2015     budesonide-formoterol 160-4.5 MCG/ACT inhaler  Commonly known as:  SYMBICORT  Inhale 2 puffs into the lungs daily as needed (Shortness of Breath).     calcitRIOL 0.25 MCG capsule  Commonly known as:  ROCALTROL  Take 0.25 mcg by mouth daily.     cetirizine 10 MG tablet  Commonly known as:  ZYRTEC  Take 10 mg by mouth daily. As needed     docusate sodium 100 MG capsule  Commonly known as:  COLACE  Take 1 capsule (100 mg total) by mouth 2 (two) times daily.     doxycycline 100 MG EC tablet  Commonly known as:  DORYX  Take 100 mg by mouth 2 (two) times daily.     HYDROcodone-acetaminophen 5-325 MG tablet  Commonly known as:  NORCO/VICODIN  Take 1-2 tablets by mouth every 6 (six) hours as needed.     hydrocortisone 2.5 % rectal cream  Commonly known as:  ANUSOL-HC  Place 1 application rectally 2 (two) times daily. For 14 days.     Insulin Glargine 100 UNIT/ML Solostar Pen  Commonly known as:  LANTUS SOLOSTAR  Inject 50 Units into the skin at bedtime.     iron polysaccharides 150 MG capsule  Commonly known as:  NIFEREX  Take 150 mg by mouth daily.     NASONEX 50 MCG/ACT nasal spray  Generic drug:  mometasone  Place 2 sprays into the nose daily as needed (allergies).     pantoprazole 40 MG tablet  Commonly known as:  PROTONIX  Take 40 mg by mouth daily.     PROVENTIL HFA 108 (90 Base) MCG/ACT inhaler  Generic drug:  albuterol  Inhale 2 puffs into the lungs every 4 (four) hours as needed for wheezing.     senna 8.6 MG Tabs tablet  Commonly known as:  SENOKOT  Take 1 tablet (8.6 mg total) by mouth daily.     simvastatin 20 MG tablet  Commonly known as:  ZOCOR  Take 20 mg by mouth every morning.     TRADJENTA 5 MG Tabs tablet  Generic drug:  linagliptin  TAKE 1 TABLET EVERY DAY     valsartan 40 MG tablet  Commonly known as:  DIOVAN  Take 1 tablet (40 mg total) by mouth daily.        Followup: As scheduled with Dr.  Gaynelle Arabian  Urology Attending Note: Pt seen and examined: ready for discharge .

## 2015-08-17 NOTE — Discharge Instructions (Signed)
Inflatable Penile Prosthesis-Home Care Instructions  The following instructions have been prepared to help you care for yourself upon your return home today.   Wound Care & Hygiene:   You may apply ice to the penis.  This may help to decrease swelling.  Remove the dressing tomorrow.  If the dressing falls off before then, leave it off.  You may shower or bathe in 48 hours  Gently wash the penis with soap and water.  The stitches do not need to be removed.  Activity:  Do not drive or operate any equipment today.  The effects of anesthesia are still present, drowsiness may result.  Rest today, not necessarily flat bed rest, just take it easy.  You may resume your normal activity in one to two days or as indicated by your physician.  DO NOT ACTIVATE OR ATTEMPT TO USE YOUR PROSTHESIS DEVICE UNTIL ADVISED TO BY DR.TANNENBAUM (6 weeks following surgery)  Finish 10 day course of antibiotics as prescribed  At 10 days following surgery you may soak in a bathtub for 20 minutes per day and massage pump down into scrotum  You may resume your Plavix 5 days following surgery (Saturday 08/21/15)  Sexual Activity:  Erection and sexual relations should be avoided until you are instructed otherwise by Dr. Gaynelle Arabian  Return to Work:  One week. DO NOT LIFT ANY OBJECTS HEAVIER THAN A GALLON OF MILK (10 Lbs) or RIDE ON HEAVY MACHINERY UNTIL INSTRUCTED OTHERWISE  Diet:  Drink liquids or eat a very light diet this evening.  You may resume a regular diet tomorrow.  General Expectations of your surgery:   You may have a small amount of bleeding  The penis will be swollen and bruised for approximately one week  You may wake during the night with an erection, usually this is caused by having a full bladder so you should try to urinate (pass your water) to relieve the erection or apply ice to the penis  Unexpected Observations - Call your doctor if these occur!  Persistent or heavy bleeding  Temperature  of 101 degrees or more  Severe pain not relieved by medication

## 2015-08-17 NOTE — Progress Notes (Signed)
Patient claims he is going home today and will not need our CPAP for tonight.

## 2015-08-25 ENCOUNTER — Other Ambulatory Visit: Payer: Self-pay

## 2015-08-25 MED ORDER — INSULIN GLARGINE 100 UNIT/ML SOLOSTAR PEN: 50.0000 [IU] | PEN_INJECTOR | Freq: Every day | SUBCUTANEOUS | Status: DC

## 2015-08-26 DIAGNOSIS — Z0000 Encounter for general adult medical examination without abnormal findings: Secondary | ICD-10-CM | POA: Diagnosis not present

## 2015-08-26 DIAGNOSIS — N4889 Other specified disorders of penis: Secondary | ICD-10-CM | POA: Diagnosis not present

## 2015-09-01 ENCOUNTER — Telehealth: Payer: Self-pay | Admitting: *Deleted

## 2015-09-01 NOTE — Telephone Encounter (Signed)
Patient called to schedule his surgery. Please advise [phone]

## 2015-09-04 ENCOUNTER — Other Ambulatory Visit: Payer: Self-pay | Admitting: "Endocrinology

## 2015-09-04 DIAGNOSIS — Z794 Long term (current) use of insulin: Secondary | ICD-10-CM | POA: Diagnosis not present

## 2015-09-04 DIAGNOSIS — E1122 Type 2 diabetes mellitus with diabetic chronic kidney disease: Secondary | ICD-10-CM | POA: Diagnosis not present

## 2015-09-04 DIAGNOSIS — N184 Chronic kidney disease, stage 4 (severe): Secondary | ICD-10-CM | POA: Diagnosis not present

## 2015-09-04 LAB — HEMOGLOBIN A1C
HEMOGLOBIN A1C: 7.1 % — AB (ref <5.7)
Mean Plasma Glucose: 157 mg/dL — ABNORMAL HIGH (ref <117)

## 2015-09-04 LAB — BASIC METABOLIC PANEL
BUN: 31 mg/dL — ABNORMAL HIGH (ref 7–25)
CHLORIDE: 105 mmol/L (ref 98–110)
CO2: 28 mmol/L (ref 20–31)
Calcium: 8.5 mg/dL — ABNORMAL LOW (ref 8.6–10.3)
Creat: 2.89 mg/dL — ABNORMAL HIGH (ref 0.70–1.25)
GLUCOSE: 70 mg/dL (ref 65–99)
POTASSIUM: 4 mmol/L (ref 3.5–5.3)
SODIUM: 142 mmol/L (ref 135–146)

## 2015-09-07 NOTE — Telephone Encounter (Signed)
Patient called back regarding scheduling of right knee surgery, per last office visit with Dr Aline Brochure on 07/05/15. Patient has seen his cardiologist, per advice, and a letter of clearance ("low risk" has been received by [facility], dated 07/05/15, and is in his chart.  Will a new appointment/office visit be needed, or can surgery date be scheduled?  He cannot have it on or around 08/2715, as he has injection with Dr Francesco Runner scheduled for then.  Please advise. [phone]

## 2015-09-08 ENCOUNTER — Ambulatory Visit (INDEPENDENT_AMBULATORY_CARE_PROVIDER_SITE_OTHER): Payer: Medicare Other | Admitting: "Endocrinology

## 2015-09-08 ENCOUNTER — Encounter: Payer: Self-pay | Admitting: "Endocrinology

## 2015-09-08 VITALS — BP 143/85 | HR 73 | Ht 64.0 in | Wt 246.0 lb

## 2015-09-08 DIAGNOSIS — N184 Chronic kidney disease, stage 4 (severe): Secondary | ICD-10-CM

## 2015-09-08 DIAGNOSIS — Z794 Long term (current) use of insulin: Secondary | ICD-10-CM

## 2015-09-08 DIAGNOSIS — E1165 Type 2 diabetes mellitus with hyperglycemia: Secondary | ICD-10-CM | POA: Diagnosis not present

## 2015-09-08 DIAGNOSIS — E1122 Type 2 diabetes mellitus with diabetic chronic kidney disease: Secondary | ICD-10-CM

## 2015-09-08 DIAGNOSIS — I10 Essential (primary) hypertension: Secondary | ICD-10-CM | POA: Diagnosis not present

## 2015-09-08 DIAGNOSIS — E785 Hyperlipidemia, unspecified: Secondary | ICD-10-CM | POA: Diagnosis not present

## 2015-09-08 NOTE — Patient Instructions (Signed)

## 2015-09-08 NOTE — Progress Notes (Signed)
Subjective:    Patient ID: [name] [name], male    DOB: 1950/12/20, PCP [name], MD   Past Medical History  Diagnosis Date  . COPD (chronic obstructive pulmonary disease) ([place])   . Sinusitis   . PUD (peptic ulcer disease)     remote, reports f/u EGD about 8 years ago unremarkable   . Diabetes ([place])   . GERD (gastroesophageal reflux disease)   . Hyperlipidemia   . Hypertension   . Post traumatic stress disorder   . Bell palsy   . Obesity     [place]  . Heart disease     family history  . Hepatomegaly     noted on noncontrast CT 2015  . Vitamin D deficiency   . Chronic back pain   . Chronic shoulder pain   . Chronic knee pain   . Chronic pain   . Sleep apnea     cpap - settings at 4   . Shortness of breath dyspnea     with exertion   . Arthritis   . On home oxygen therapy     uses 2l when is going somewhere per patient   . Chronic kidney disease     stage 4 per office visit note of Dr Lowanda Foster on 05/2015   . Chronic sinusitis   . Reactive airway disease     related to exposure to chemical during 9/11   Past Surgical History  Procedure Laterality Date  . Asad lt shoulder  12/2008    left shoulder  . Umbilical hernia repair  2007    [place]  . Knee arthroscopy  10/2007    left  . Toenail excision      removed x2-bilateral  . Eye surgery  12/22/2010    tear duct probing-[place]  . Foreign body removal  03/29/2011    Procedure: REMOVAL FOREIGN BODY EXTREMITY;  Surgeon: Arther Abbott, MD;  Location: [facility];  Service: Orthopedics;  Laterality: Right;  Removal Foreign Body Right Thumb  . Quadriceps tendon repair  07/21/2011    Procedure: REPAIR QUADRICEP TENDON;  Surgeon: Arther Abbott, MD;  Location: [facility];  Service: Orthopedics;  Laterality: Right;  . Nm myoview ltd    . Doppler echocardiography    . Left heart catheterization with coronary angiogram N/A 03/28/2013    Procedure: LEFT HEART CATHETERIZATION WITH CORONARY ANGIOGRAM;  Surgeon: Leonie Man, MD;  Location: [facility] CATH LAB;  Service: Cardiovascular;  Laterality: N/A;  . Colonoscopy  10/2008    Fields: Rectal polyp obliterated, not retrieved, hemorrhoids, single ascending colon diverticulum near the CV. Next colonoscopy April 2020  . Colonoscopy N/A 12/25/2014    SLF: 1. Colorectal polyps (2) removed 2. Small internal hemorrhoids 3. the left colon is severely redundant  . Esophagogastroduodenoscopy N/A 12/25/2014    SLF: 1. Anemia most likely due to CRI, gastritis, gastric polyps 2. Moderate non-erosive gastriits and mild duodenitis.  3.TWo large gstric polyps removed.   Marland Kitchen Penile prosthesis implant N/A 08/16/2015    Procedure: PENILE PROTHESIS INFLATABLE, three piece, Excisional biopsy of Penile ulcer, Penile molding;  Surgeon: Carolan Clines, MD;  Location: [facility];  Service: Urology;  Laterality: N/A;   Social History   Social History  . Marital Status: Married    Spouse Name: N/A  . Number of Children: 2  . Years of Education: 12th grade   Occupational History  . disabled   .     Marland Kitchen     Social History  Main Topics  . Smoking status: Former Smoker -- 1.00 packs/day for 25 years    Types: Cigarettes    Quit date: 03/27/2010  . Smokeless tobacco: Never Used     Comment: Quit x 7 years  . Alcohol Use: 0.0 oz/week    0 Standard drinks or equivalent per week     Comment: occasionally  . Drug Use: Yes    Special: Cocaine, Marijuana     Comment: stopped feb2011  . Sexual Activity: Not Asked   Other Topics Concern  . None   Social History Narrative   He quit smoking in 2010. 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 He developed pulmonary problems, became disabled because of lower airway disease in 2009.    Outpatient Encounter Prescriptions as of 09/08/2015  Medication Sig  . albuterol (PROVENTIL HFA) 108 (90 BASE) MCG/ACT inhaler Inhale 2 puffs into the lungs every 4 (four) hours as needed for wheezing.   Marland Kitchen amLODipine  (NORVASC) 10 MG tablet Take 10 mg by mouth daily.   Marland Kitchen amoxicillin-clavulanate (AUGMENTIN) 875-125 MG tablet Take 1 tablet by mouth 2 (two) times daily.  . budesonide-formoterol (SYMBICORT) 160-4.5 MCG/ACT inhaler Inhale 2 puffs into the lungs daily as needed (Shortness of Breath).   . calcitRIOL (ROCALTROL) 0.25 MCG capsule Take 0.25 mcg by mouth daily.  . cetirizine (ZYRTEC) 10 MG tablet Take 10 mg by mouth daily. As needed  . docusate sodium (COLACE) 100 MG capsule Take 1 capsule (100 mg total) by mouth 2 (two) times daily.  Marland Kitchen doxycycline (DORYX) 100 MG EC tablet Take 100 mg by mouth 2 (two) times daily.  . hydrocortisone (ANUSOL-HC) 2.5 % rectal cream Place 1 application rectally 2 (two) times daily. For 14 days. (Patient taking differently: Place 1 application rectally 2 (two) times daily. )  . Insulin Glargine (LANTUS SOLOSTAR) 100 UNIT/ML Solostar Pen Inject 50 Units into the skin at bedtime.  . iron polysaccharides (NIFEREX) 150 MG capsule Take 150 mg by mouth daily.  . mometasone (NASONEX) 50 MCG/ACT nasal spray Place 2 sprays into the nose daily as needed (allergies).   Marland Kitchen oxyCODONE-acetaminophen (ROXICET) 5-325 MG tablet Take 1 tablet by mouth every 4 (four) hours as needed for severe pain.  . pantoprazole (PROTONIX) 40 MG tablet Take 40 mg by mouth daily.  Marland Kitchen senna (SENOKOT) 8.6 MG TABS tablet Take 1 tablet (8.6 mg total) by mouth daily.  . simvastatin (ZOCOR) 20 MG tablet Take 20 mg by mouth every morning.   . TRADJENTA 5 MG TABS tablet TAKE 1 TABLET EVERY DAY  . valsartan (DIOVAN) 40 MG tablet Take 1 tablet (40 mg total) by mouth daily.   No facility-administered encounter medications on file as of 09/08/2015.   ALLERGIES: Allergies  Allergen Reactions  . Opana [Oxymorphone Hcl] Itching  . Tramadol Itching   VACCINATION STATUS: Immunization History  Administered Date(s) Administered  . Td 03/27/2011    Diabetes He presents for his follow-up diabetic visit. He has type 2  diabetes mellitus. Onset time: He was diagnosed approximately 20 years ago. His disease course has been stable. There are no hypoglycemic associated symptoms. Pertinent negatives for hypoglycemia include no confusion, headaches, pallor or seizures. Associated symptoms include polyuria. Pertinent negatives for diabetes include no chest pain, no fatigue, no polydipsia, no polyphagia and no weakness. There are no hypoglycemic complications. Symptoms are stable. Diabetic complications include nephropathy. Risk factors for coronary artery disease include dyslipidemia, diabetes mellitus, hypertension, obesity, sedentary  lifestyle, tobacco exposure and male sex. Current diabetic treatment includes insulin injections and oral agent (monotherapy). He is compliant with treatment most of the time. He is following a generally unhealthy diet. He has had a previous visit with a dietitian. He rarely participates in exercise. His breakfast blood glucose range is generally 140-180 mg/dl. Eye exam is current.  Hypertension This is a chronic problem. The current episode started more than 1 year ago. The problem is controlled. Pertinent negatives include no chest pain, headaches, neck pain, palpitations or shortness of breath. Risk factors for coronary artery disease include diabetes mellitus, dyslipidemia, obesity, smoking/tobacco exposure and sedentary lifestyle. Past treatments include angiotensin blockers. Hypertensive end-organ damage includes kidney disease.  Hyperlipidemia This is a chronic problem. The current episode started more than 1 year ago. Pertinent negatives include no chest pain, myalgias or shortness of breath. Current antihyperlipidemic treatment includes statins. Risk factors for coronary artery disease include diabetes mellitus, dyslipidemia, hypertension, male sex and a sedentary lifestyle.     Review of Systems  Constitutional: Negative for fatigue and unexpected weight change.  HENT: Negative for  dental problem, mouth sores and trouble swallowing.   Eyes: Negative for visual disturbance.  Respiratory: Negative for cough, choking, chest tightness, shortness of breath and wheezing.   Cardiovascular: Negative for chest pain, palpitations and leg swelling.  Gastrointestinal: Negative for nausea, vomiting, abdominal pain, diarrhea, constipation and abdominal distention.  Endocrine: Positive for polyuria. Negative for polydipsia and polyphagia.  Genitourinary: Negative for dysuria, urgency, hematuria and flank pain.  Musculoskeletal: Negative for myalgias, back pain, gait problem and neck pain.  Skin: Negative for pallor, rash and wound.  Neurological: Negative for seizures, syncope, weakness, numbness and headaches.  Psychiatric/Behavioral: Negative.  Negative for confusion and dysphoric mood.    Objective:    BP 143/85 mmHg  Pulse 73  Ht 5\' 4"  (1.626 m)  Wt 246 lb (111.585 kg)  BMI 42.21 kg/m2  SpO2 96%  Wt Readings from Last 3 Encounters:  09/08/15 246 lb (111.585 kg)  08/16/15 237 lb (107.502 kg)  08/11/15 237 lb (107.502 kg)    Physical Exam  Constitutional: He is oriented to person, place, and time. He appears well-developed and well-nourished. He is cooperative. No distress.  HENT:  Head: Normocephalic and atraumatic.  Eyes: EOM are normal.  Neck: Normal range of motion. Neck supple. No tracheal deviation present. No thyromegaly present.  Cardiovascular: Normal rate, S1 normal, S2 normal and normal heart sounds.  Exam reveals no gallop.   No murmur heard. Pulses:      Dorsalis pedis pulses are 1+ on the right side, and 1+ on the left side.       Posterior tibial pulses are 1+ on the right side, and 1+ on the left side.  Pulmonary/Chest: Breath sounds normal. No respiratory distress. He has no wheezes.  Abdominal: Soft. Bowel sounds are normal. He exhibits no distension. There is no tenderness. There is no guarding and no CVA tenderness.  Musculoskeletal: He exhibits no  edema.       Right shoulder: He exhibits no swelling and no deformity.  Neurological: He is alert and oriented to person, place, and time. He has normal strength and normal reflexes. No cranial nerve deficit or sensory deficit. Gait normal.  Skin: Skin is warm and dry. No rash noted. No cyanosis. Nails show no clubbing.  Psychiatric: He has a normal mood and affect. His speech is normal and behavior is normal. Judgment and thought content normal. Cognition and memory  are normal.    Results for orders placed or performed in visit on [id number]  Basic metabolic panel  Result Value Ref Range   Sodium 142 135 - 146 mmol/L   Potassium 4.0 3.5 - 5.3 mmol/L   Chloride 105 98 - 110 mmol/L   CO2 28 20 - 31 mmol/L   Glucose, Bld 70 65 - 99 mg/dL   BUN 31 (H) 7 - 25 mg/dL   Creat 2.89 (H) 0.70 - 1.25 mg/dL   Calcium 8.5 (L) 8.6 - 10.3 mg/dL  Hemoglobin A1c  Result Value Ref Range   Hgb A1c MFr Bld 7.1 (H) <5.7 %   Mean Plasma Glucose 157 (H) <117 mg/dL   Complete Blood Count (Most recent): Lab Results  Component Value Date   WBC 5.8 08/11/2015   HGB 11.1* 08/17/2015   HCT 34.0* 08/17/2015   MCV 81.5 08/11/2015   PLT 157 08/11/2015   Chemistry (most recent): Lab Results  Component Value Date   NA 142 09/04/2015   K 4.0 09/04/2015   CL 105 09/04/2015   CO2 28 09/04/2015   BUN 31* 09/04/2015   CREATININE 2.89* 09/04/2015   Diabetic Labs (most recent): Lab Results  Component Value Date   HGBA1C 7.1* 09/04/2015   HGBA1C 7.1* 08/11/2015   HGBA1C 7.8* 05/14/2015   Lipid Panel     Component Value Date/Time   CHOL 129 05/14/2015 0855   TRIG 108 05/14/2015 0855   HDL 35* 05/14/2015 0855   CHOLHDL 3.7 05/14/2015 0855   VLDL 22 05/14/2015 0855   LDLCALC 72 05/14/2015 0855     Assessment & Plan:   1. Type 2 diabetes mellitus with stage 4 chronic kidney disease, with long-term current use of insulin (HCC)  -His  Diabetes is complicated by chronic kidney disease and patient  remains at a high risk for more acute and chronic complications of diabetes which include CAD, CVA, CKD, retinopathy, and neuropathy. These are all discussed in detail with the patient.  Patient came with controlled fasting glucose profile, and  recent A1c of 7.1% unchanged from last visit.  Glucose logs and insulin administration records pertaining to this visit,  to be scanned into patient's records.  Recent labs reviewed.   - I have re-counseled the patient on diet management and weight loss  by adopting a carbohydrate restricted / protein rich  Diet.  - Suggestion is made for patient to avoid simple carbohydrates   from their diet including Cakes , Desserts, Ice Cream,  Soda (  diet and regular) , Sweet Tea , Candies,  Chips, Cookies, Artificial Sweeteners,   and "Sugar-free" Products .  This will help patient to have stable blood glucose profile and potentially avoid unintended  Weight gain.  - Patient is advised to stick to a routine mealtimes to eat 3 meals  a day and avoid unnecessary snacks ( to snack only to correct hypoglycemia). I have also advised him to cut back on alcohol consumption.  - The patient  has been  scheduled with Jearld Fenton, RDN, CDE for individualized DM education.  - I have approached patient with the following individualized plan to manage diabetes and patient agrees.  - I re-counseled him to stay focused to maintain control his DM.  -He is advised to stay away from ETOH and smoking. He will continue to need at least basal insulin therapy.  -I will continue Lantus  50 units qhs, off of Humalog . Continue tradjenta 5mg  po qday.  -He will  be considered for prandial insulin if he continues to have high A1c or documented hyperglycemia.  He is following with Jearld Fenton, CDE for DM education.  He will be monitoring of BG BID.  -He will continue to f/u with Nephrology given stage IV CKD ( improving),  even after his DM is controlled.  -Patient is not a  candidate for metformin and SGLT2 inhibitors due to CKD.  - Patient specific target  for A1c; LDL, HDL, Triglycerides, and  Waist Circumference were discussed in detail.  2) BP/HTN: Controlled. Continue current medications including ACEI/ARB. 3) Lipids/HPL:  continue statins. 4)  Weight/Diet: CDE consult in progress, exercise, and carbohydrates information provided. 5) Vitamin D deficiency -I advised him to continue vitamin D 50,000 units weekly  6) Chronic Care/Health Maintenance:  -Patient is on ACEI/ARB and Statin medications and encouraged to continue to follow up with Ophthalmology, Podiatrist at least yearly or according to recommendations, and advised to quit smoking. I have recommended yearly flu vaccine and pneumonia vaccination at least every 5 years; moderate intensity exercise for up to 150 minutes weekly; and  sleep for at least 7 hours a day.  - 25 minutes of time was spent on the care of this patient , 50% of which was applied for counseling on diabetes complications and their preventions.  - I advised patient to maintain close follow up with their PCP for primary care needs. - Patient is asked to bring meter and  blood glucose logs during their next visit.   Follow up plan: -Return in about 3 months (around 12/06/2015) for diabetes, high blood pressure, high cholesterol, follow up with pre-visit labs, meter, and logs.  Glade Lloyd, MD Phone: [phone]  Fax: [phone]   09/08/2015, 10:14 AM

## 2015-09-10 NOTE — Telephone Encounter (Signed)
Done; Patient aware of scheduled appointment.

## 2015-09-10 NOTE — Telephone Encounter (Signed)
Please schedule him to come in to see Dr Aline Brochure since it has been a couple months, we have a new consent process as well, thanks

## 2015-09-13 DIAGNOSIS — Z79899 Other long term (current) drug therapy: Secondary | ICD-10-CM | POA: Diagnosis not present

## 2015-09-13 DIAGNOSIS — Z794 Long term (current) use of insulin: Secondary | ICD-10-CM | POA: Diagnosis not present

## 2015-09-13 DIAGNOSIS — Z7951 Long term (current) use of inhaled steroids: Secondary | ICD-10-CM | POA: Diagnosis not present

## 2015-09-13 DIAGNOSIS — Z888 Allergy status to other drugs, medicaments and biological substances status: Secondary | ICD-10-CM | POA: Diagnosis not present

## 2015-09-13 DIAGNOSIS — E119 Type 2 diabetes mellitus without complications: Secondary | ICD-10-CM | POA: Diagnosis not present

## 2015-09-13 DIAGNOSIS — E785 Hyperlipidemia, unspecified: Secondary | ICD-10-CM | POA: Diagnosis not present

## 2015-09-13 DIAGNOSIS — K219 Gastro-esophageal reflux disease without esophagitis: Secondary | ICD-10-CM | POA: Diagnosis not present

## 2015-09-13 DIAGNOSIS — M545 Low back pain: Secondary | ICD-10-CM | POA: Diagnosis not present

## 2015-09-13 DIAGNOSIS — M47816 Spondylosis without myelopathy or radiculopathy, lumbar region: Secondary | ICD-10-CM | POA: Diagnosis not present

## 2015-09-13 DIAGNOSIS — I10 Essential (primary) hypertension: Secondary | ICD-10-CM | POA: Diagnosis not present

## 2015-09-13 DIAGNOSIS — Z7902 Long term (current) use of antithrombotics/antiplatelets: Secondary | ICD-10-CM | POA: Diagnosis not present

## 2015-09-13 DIAGNOSIS — Z886 Allergy status to analgesic agent status: Secondary | ICD-10-CM | POA: Diagnosis not present

## 2015-09-17 DIAGNOSIS — I10 Essential (primary) hypertension: Secondary | ICD-10-CM | POA: Diagnosis not present

## 2015-09-17 DIAGNOSIS — D519 Vitamin B12 deficiency anemia, unspecified: Secondary | ICD-10-CM | POA: Diagnosis not present

## 2015-09-17 DIAGNOSIS — Z79899 Other long term (current) drug therapy: Secondary | ICD-10-CM | POA: Diagnosis not present

## 2015-09-17 DIAGNOSIS — E559 Vitamin D deficiency, unspecified: Secondary | ICD-10-CM | POA: Diagnosis not present

## 2015-09-17 DIAGNOSIS — R809 Proteinuria, unspecified: Secondary | ICD-10-CM | POA: Diagnosis not present

## 2015-09-17 DIAGNOSIS — N183 Chronic kidney disease, stage 3 (moderate): Secondary | ICD-10-CM | POA: Diagnosis not present

## 2015-09-22 DIAGNOSIS — N184 Chronic kidney disease, stage 4 (severe): Secondary | ICD-10-CM | POA: Diagnosis not present

## 2015-09-22 DIAGNOSIS — I10 Essential (primary) hypertension: Secondary | ICD-10-CM | POA: Diagnosis not present

## 2015-09-22 DIAGNOSIS — R809 Proteinuria, unspecified: Secondary | ICD-10-CM | POA: Diagnosis not present

## 2015-09-22 DIAGNOSIS — E1129 Type 2 diabetes mellitus with other diabetic kidney complication: Secondary | ICD-10-CM | POA: Diagnosis not present

## 2015-09-27 ENCOUNTER — Encounter: Payer: Self-pay | Admitting: Orthopedic Surgery

## 2015-09-27 ENCOUNTER — Ambulatory Visit (INDEPENDENT_AMBULATORY_CARE_PROVIDER_SITE_OTHER): Payer: Medicare Other | Admitting: Orthopedic Surgery

## 2015-09-27 VITALS — BP 160/98 | Ht 64.0 in | Wt 244.0 lb

## 2015-09-27 DIAGNOSIS — M23300 Other meniscus derangements, unspecified lateral meniscus, right knee: Secondary | ICD-10-CM | POA: Diagnosis not present

## 2015-09-27 DIAGNOSIS — M23306 Other meniscus derangements, unspecified meniscus, right knee: Secondary | ICD-10-CM | POA: Diagnosis not present

## 2015-09-27 DIAGNOSIS — M23303 Other meniscus derangements, unspecified medial meniscus, right knee: Secondary | ICD-10-CM

## 2015-09-27 DIAGNOSIS — M1711 Unilateral primary osteoarthritis, right knee: Secondary | ICD-10-CM

## 2015-09-27 NOTE — Progress Notes (Signed)
Chief Complaint  Patient presents with  . Follow-up    FOLLOW UP RIGHT KNEE, DISCUSS SURGERY

## 2015-09-27 NOTE — Addendum Note (Signed)
Addended by: Baldomero Lamy B on: 09/27/2015 05:20 PM   Modules accepted: Orders

## 2015-09-27 NOTE — Patient Instructions (Addendum)
SURGERY RIGHT KNEE ARTHROSCOPY WITH LATERAL MENISECTOMY- 10/14/15
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
## 2015-09-28 ENCOUNTER — Other Ambulatory Visit (HOSPITAL_COMMUNITY): Payer: Self-pay | Admitting: Nephrology

## 2015-09-28 DIAGNOSIS — Z0000 Encounter for general adult medical examination without abnormal findings: Secondary | ICD-10-CM | POA: Diagnosis not present

## 2015-09-28 DIAGNOSIS — N183 Chronic kidney disease, stage 3 (moderate): Secondary | ICD-10-CM

## 2015-09-28 DIAGNOSIS — E291 Testicular hypofunction: Secondary | ICD-10-CM | POA: Diagnosis not present

## 2015-10-06 ENCOUNTER — Ambulatory Visit (HOSPITAL_COMMUNITY)
Admission: RE | Admit: 2015-10-06 | Discharge: 2015-10-06 | Disposition: A | Discharge status: Home / Self Care | Payer: Medicare Other | Source: Ambulatory Visit | Attending: Nephrology | Admitting: Nephrology

## 2015-10-06 ENCOUNTER — Other Ambulatory Visit: Payer: Self-pay | Admitting: "Endocrinology

## 2015-10-06 ENCOUNTER — Other Ambulatory Visit (HOSPITAL_COMMUNITY): Payer: Self-pay

## 2015-10-06 DIAGNOSIS — N183 Chronic kidney disease, stage 3 (moderate): Secondary | ICD-10-CM | POA: Diagnosis not present

## 2015-10-06 DIAGNOSIS — I10 Essential (primary) hypertension: Secondary | ICD-10-CM | POA: Diagnosis not present

## 2015-10-06 DIAGNOSIS — D519 Vitamin B12 deficiency anemia, unspecified: Secondary | ICD-10-CM | POA: Diagnosis not present

## 2015-10-06 DIAGNOSIS — E559 Vitamin D deficiency, unspecified: Secondary | ICD-10-CM | POA: Diagnosis not present

## 2015-10-06 DIAGNOSIS — R809 Proteinuria, unspecified: Secondary | ICD-10-CM | POA: Diagnosis not present

## 2015-10-06 DIAGNOSIS — Z79899 Other long term (current) drug therapy: Secondary | ICD-10-CM | POA: Diagnosis not present

## 2015-10-08 ENCOUNTER — Encounter (HOSPITAL_COMMUNITY): Payer: Self-pay

## 2015-10-08 DIAGNOSIS — M25511 Pain in right shoulder: Secondary | ICD-10-CM | POA: Diagnosis not present

## 2015-10-08 DIAGNOSIS — E1122 Type 2 diabetes mellitus with diabetic chronic kidney disease: Secondary | ICD-10-CM | POA: Insufficient documentation

## 2015-10-08 DIAGNOSIS — Y939 Activity, unspecified: Secondary | ICD-10-CM | POA: Insufficient documentation

## 2015-10-08 DIAGNOSIS — Y999 Unspecified external cause status: Secondary | ICD-10-CM | POA: Diagnosis not present

## 2015-10-08 DIAGNOSIS — W1839XA Other fall on same level, initial encounter: Secondary | ICD-10-CM | POA: Diagnosis not present

## 2015-10-08 DIAGNOSIS — J449 Chronic obstructive pulmonary disease, unspecified: Secondary | ICD-10-CM | POA: Diagnosis not present

## 2015-10-08 DIAGNOSIS — Z87891 Personal history of nicotine dependence: Secondary | ICD-10-CM | POA: Insufficient documentation

## 2015-10-08 DIAGNOSIS — I129 Hypertensive chronic kidney disease with stage 1 through stage 4 chronic kidney disease, or unspecified chronic kidney disease: Secondary | ICD-10-CM | POA: Insufficient documentation

## 2015-10-08 DIAGNOSIS — E669 Obesity, unspecified: Secondary | ICD-10-CM | POA: Diagnosis not present

## 2015-10-08 DIAGNOSIS — N184 Chronic kidney disease, stage 4 (severe): Secondary | ICD-10-CM | POA: Insufficient documentation

## 2015-10-08 DIAGNOSIS — E785 Hyperlipidemia, unspecified: Secondary | ICD-10-CM | POA: Insufficient documentation

## 2015-10-08 DIAGNOSIS — S46911A Strain of unspecified muscle, fascia and tendon at shoulder and upper arm level, right arm, initial encounter: Secondary | ICD-10-CM | POA: Diagnosis not present

## 2015-10-08 DIAGNOSIS — Y929 Unspecified place or not applicable: Secondary | ICD-10-CM | POA: Diagnosis not present

## 2015-10-08 DIAGNOSIS — S4991XA Unspecified injury of right shoulder and upper arm, initial encounter: Secondary | ICD-10-CM | POA: Diagnosis present

## 2015-10-08 DIAGNOSIS — S46811A Strain of other muscles, fascia and tendons at shoulder and upper arm level, right arm, initial encounter: Secondary | ICD-10-CM | POA: Diagnosis not present

## 2015-10-08 DIAGNOSIS — R05 Cough: Secondary | ICD-10-CM | POA: Diagnosis not present
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
## 2015-10-09 ENCOUNTER — Emergency Department (HOSPITAL_COMMUNITY)
Admission: EM | Admit: 2015-10-09 | Discharge: 2015-10-09 | Disposition: A | Discharge status: Home / Self Care | Payer: Medicare Other | Attending: Emergency Medicine | Admitting: Emergency Medicine

## 2015-10-09 ENCOUNTER — Emergency Department (HOSPITAL_COMMUNITY): Payer: Medicare Other

## 2015-10-09 DIAGNOSIS — R05 Cough: Secondary | ICD-10-CM

## 2015-10-09 DIAGNOSIS — S46911A Strain of unspecified muscle, fascia and tendon at shoulder and upper arm level, right arm, initial encounter: Secondary | ICD-10-CM | POA: Diagnosis not present

## 2015-10-09 DIAGNOSIS — M25511 Pain in right shoulder: Secondary | ICD-10-CM | POA: Diagnosis not present

## 2015-10-09 DIAGNOSIS — R059 Cough, unspecified: Secondary | ICD-10-CM

## 2015-10-09 MED ORDER — IPRATROPIUM-ALBUTEROL 0.5-2.5 (3) MG/3ML IN SOLN
3.0000 mL | Freq: Once | RESPIRATORY_TRACT | Status: AC
  Administered 2015-10-09: 3 mL via RESPIRATORY_TRACT
  Filled 2015-10-09: qty 3

## 2015-10-09 MED ORDER — ALBUTEROL SULFATE (2.5 MG/3ML) 0.083% IN NEBU
2.5000 mg | INHALATION_SOLUTION | Freq: Once | RESPIRATORY_TRACT | Status: AC
  Administered 2015-10-09: 2.5 mg via RESPIRATORY_TRACT
  Filled 2015-10-09: qty 3

## 2015-10-09 MED ORDER — HYDROCODONE-ACETAMINOPHEN 5-325 MG PO TABS
2.0000 | ORAL_TABLET | Freq: Once | ORAL | Status: AC
  Administered 2015-10-09: 2 via ORAL
  Filled 2015-10-09: qty 2

## 2015-10-09 MED ORDER — HYDROCODONE-ACETAMINOPHEN 5-325 MG PO TABS: ORAL_TABLET | ORAL | Status: DC

## 2015-10-09 NOTE — ED Provider Notes (Signed)
CSN: [id number]     Arrival date & time 10/08/15  2345 History   First MD Initiated Contact with Patient 10/09/15 0036     Chief Complaint  Patient presents with  . Shoulder Pain     (Consider location/radiation/quality/duration/timing/severity/associated sxs/prior Treatment) HPI   [name] [name] is a 65 y.o. male who presents to the Emergency Department complaining of sudden onset of right shoulder pain today.  He states the pain began after a fall in which he reached out with the right arm to grab a railing to help "break my fall." he reports sharp, throbbing pain to the anterior and posterior shoulder.  Pain is worse with movement of the arm, improves at rest.  He has not tried any medications or other therapies for his symptoms.  He reports previous shoulder pain and is currently seeing Dr. Aline Brochure of other orthopedics issues.  He denies LOC, neck pain, numbness or weakness of the upper extremity.  He also reports increased cough and wheezing for several days.  He has an albuterol inhaler at home that he states is not helping his symptoms.  He denies shortness of breath, fever, and chest pain   Past Medical History  Diagnosis Date  . COPD (chronic obstructive pulmonary disease) ([place])   . Sinusitis   . PUD (peptic ulcer disease)     remote, reports f/u EGD about 8 years ago unremarkable   . Diabetes ([place])   . GERD (gastroesophageal reflux disease)   . Hyperlipidemia   . Hypertension   . Post traumatic stress disorder   . Bell palsy   . Obesity     [place]  . Heart disease     family history  . Hepatomegaly     noted on noncontrast CT 2015  . Vitamin D deficiency   . Chronic back pain   . Chronic shoulder pain   . Chronic knee pain   . Chronic pain   . Sleep apnea     cpap - settings at 4   . Shortness of breath dyspnea     with exertion   . Arthritis   . On home oxygen therapy     uses 2l when is going somewhere per patient   . Chronic kidney disease     stage 4 per  office visit note of Dr Lowanda Foster on 05/2015   . Chronic sinusitis   . Reactive airway disease     related to exposure to chemical during 9/11   Past Surgical History  Procedure Laterality Date  . Asad lt shoulder  12/2008    left shoulder  . Umbilical hernia repair  2007    [place]  . Knee arthroscopy  10/2007    left  . Toenail excision      removed x2-bilateral  . Eye surgery  12/22/2010    [place] duct probing[place]  . Foreign body removal  03/29/2011    Procedure: REMOVAL FOREIGN BODY EXTREMITY;  Surgeon: Arther Abbott, MD;  Location: [facility];  Service: Orthopedics;  Laterality: Right;  Removal Foreign Body Right Thumb  . Quadriceps tendon repair  07/21/2011    Procedure: REPAIR QUADRICEP TENDON;  Surgeon: Arther Abbott, MD;  Location: [facility];  Service: Orthopedics;  Laterality: Right;  . Nm myoview ltd    . Doppler echocardiography    . Left heart catheterization with coronary angiogram N/A 03/28/2013    Procedure: LEFT HEART CATHETERIZATION WITH CORONARY ANGIOGRAM;  Surgeon: Leonie Man, MD;  Location: [facility] CATH LAB;  Service: Cardiovascular;  Laterality: N/A;  . Colonoscopy  10/2008    Fields: Rectal polyp obliterated, not retrieved, hemorrhoids, single ascending colon diverticulum near the CV. Next colonoscopy April 2020  . Colonoscopy N/A 12/25/2014    SLF: 1. Colorectal polyps (2) removed 2. Small internal hemorrhoids 3. the left colon is severely redundant  . Esophagogastroduodenoscopy N/A 12/25/2014    SLF: 1. Anemia most likely due to CRI, gastritis, gastric polyps 2. Moderate non-erosive gastriits and mild duodenitis.  3.TWo large gstric polyps removed.   Marland Kitchen Penile prosthesis implant N/A 08/16/2015    Procedure: PENILE PROTHESIS INFLATABLE, three piece, Excisional biopsy of Penile ulcer, Penile molding;  Surgeon: Carolan Clines, MD;  Location: [facility];  Service: Urology;  Laterality: N/A;   Family History  Problem Relation Age of Onset  . Hypertension Mother     MI  .  Cancer Mother     breast   . Diabetes Mother   . Diabetes Father   . Hypertension Father   . Hypertension Sister   . Diabetes Sister   . Arthritis    . Asthma    . Lung disease    . Anesthesia problems Neg Hx   . Hypotension Neg Hx   . Malignant hyperthermia Neg Hx   . Pseudochol deficiency Neg Hx   . Colon cancer Neg Hx    Social History  Substance Use Topics  . Smoking status: Former Smoker -- 1.00 packs/day for 25 years    Types: Cigarettes    Quit date: 03/27/2010  . Smokeless tobacco: Never Used     Comment: Quit x 7 years  . Alcohol Use: 0.0 oz/week    0 Standard drinks or equivalent per week     Comment: occasionally    Review of Systems  Constitutional: Negative for fever and chills.  HENT: Positive for congestion. Negative for sore throat and trouble swallowing.   Respiratory: Positive for cough. Negative for chest tightness and shortness of breath.   Cardiovascular: Negative for chest pain.  Gastrointestinal: Negative for nausea, vomiting and abdominal pain.  Genitourinary: Negative for dysuria and difficulty urinating.  Musculoskeletal: Positive for arthralgias (right shoulder pain). Negative for joint swelling.  Skin: Negative for color change and wound.  Neurological: Negative for dizziness, weakness, numbness and headaches.  All other systems reviewed and are negative.     Allergies  Opana and Tramadol  Home Medications   Prior to Admission medications   Medication Sig Start Date End Date Taking? Authorizing Provider  albuterol (PROVENTIL HFA) 108 (90 BASE) MCG/ACT inhaler Inhale 2 puffs into the lungs every 4 (four) hours as needed for wheezing.     Historical Provider, MD  amLODipine (NORVASC) 10 MG tablet Take 10 mg by mouth daily.     Historical Provider, MD  budesonide-formoterol (SYMBICORT) 160-4.5 MCG/ACT inhaler Inhale 2 puffs into the lungs daily as needed (Shortness of Breath).     Historical Provider, MD  calcitRIOL (ROCALTROL) 0.25 MCG  capsule Take 0.25 mcg by mouth daily.    Historical Provider, MD  cetirizine (ZYRTEC) 10 MG tablet Take 10 mg by mouth daily as needed for allergies.     Historical Provider, MD  docusate sodium (COLACE) 100 MG capsule Take 1 capsule (100 mg total) by mouth 2 (two) times daily. Patient not taking: Reported on 09/30/2015 08/16/15   Star Age, MD  hydrocortisone (ANUSOL-HC) 2.5 % rectal cream Place 1 application rectally 2 (two) times daily. For 14 days. Patient taking differently: Place 1 application  rectally 2 (two) times daily.  07/05/15   Mahala Menghini, PA-C  Insulin Glargine (LANTUS SOLOSTAR) 100 UNIT/ML Solostar Pen Inject 50 Units into the skin at bedtime. 08/25/15   Cassandria Anger, MD  iron polysaccharides (NIFEREX) 150 MG capsule Take 150 mg by mouth daily.    Historical Provider, MD  mometasone (NASONEX) 50 MCG/ACT nasal spray Place 2 sprays into the nose daily as needed (allergies).     Historical Provider, MD  oxyCODONE-acetaminophen (ROXICET) 5-325 MG tablet Take 1 tablet by mouth every 4 (four) hours as needed for severe pain. Patient not taking: Reported on 09/30/2015 08/17/15   Star Age, MD  pantoprazole (PROTONIX) 40 MG tablet Take 40 mg by mouth daily.    Historical Provider, MD  senna (SENOKOT) 8.6 MG TABS tablet Take 1 tablet (8.6 mg total) by mouth daily. Patient not taking: Reported on 09/30/2015 08/16/15   Star Age, MD  simvastatin (ZOCOR) 20 MG tablet Take 20 mg by mouth every morning.     Historical Provider, MD  TRADJENTA 5 MG TABS tablet TAKE 1 TABLET EVERY DAY 10/06/15   Cassandria Anger, MD   BP 158/86 mmHg  Pulse 78  Temp(Src) 98.1 F (36.7 C) (Oral)  Resp 18  Ht 5\' 4"  (1.626 m)  Wt 109.317 kg  BMI 41.35 kg/m2  SpO2 100% Physical Exam  Constitutional: He is oriented to person, place, and time. He appears well-developed and well-nourished. No distress.  HENT:  Head: Normocephalic and atraumatic.  Mouth/Throat: Oropharynx is clear and moist.   Neck: Normal range of motion. Neck supple. No thyromegaly present.  Cardiovascular: Normal rate, regular rhythm and intact distal pulses.   No murmur heard. Pulmonary/Chest: Effort normal. No respiratory distress. He has wheezes. He exhibits no tenderness.  Few inspiratory and expiratory wheezes.  No rales, no respiratory distress.    Musculoskeletal: He exhibits tenderness. He exhibits no edema.  ttp of the right posterior shoulder.  Pain with abduction of the right arm.  Radial pulse is brisk, distal sensation intact, CR< 2 sec. Grip strength is strong and symmetrical.   No edema , erythema or step-off deformity of the joint.   Lymphadenopathy:    He has no cervical adenopathy.  Neurological: He is alert and oriented to person, place, and time. He has normal strength. No sensory deficit. He exhibits normal muscle tone. Coordination normal.  Reflex Scores:      Tricep reflexes are 1+ on the right side and 2+ on the left side.      Bicep reflexes are 1+ on the right side and 2+ on the left side. Skin: Skin is warm and dry.  Nursing note and vitals reviewed.   ED Course  Procedures (including critical care time) Labs Review Labs Reviewed - No data to display  Imaging Review Dg Shoulder Right  10/09/2015  CLINICAL DATA:  Reaching injury, RIGHT shoulder pain. EXAM: RIGHT SHOULDER - 2+ VIEW COMPARISON:  MRI of the RIGHT shoulder May 11, 2012 FINDINGS: The humeral head is well-formed and located. The subacromial, glenohumeral and acromioclavicular joint spaces are intact. No destructive bony lesions. Soft tissue planes are non-suspicious. IMPRESSION: No acute fracture deformity or dislocation. Electronically Signed   By: Elon Alas M.D.   On: 10/09/2015 00:41   I have personally reviewed and evaluated these images and lab results as part of my medical decision-making.   EKG Interpretation None      MDM   Final diagnoses:  Shoulder strain, right, initial encounter  Cough     Pt with sudden onset of right shoulder pain after a fall.  XR neg for fx or dislocation.  NV intact.  No cervical tenderness.  Pt agrees to symptomatic tx and close orthopedic f/u with Dr. Aline Brochure.  Wheezing improved after neb.  Has albuterol MDI at home.  Appears stable for d/c and agrees to PMD f/u    Kem Parkinson, PA-C 10/09/15 1126  Rolland Porter, MD 10/13/15 2306

## 2015-10-11 NOTE — Patient Instructions (Signed)
PILOT ENGLES  10/11/2015     @PREFPERIOPPHARMACY @   Your procedure is scheduled on 10/14/2015.  Report to [facility] at 8:00 A.M.  Call this number if you have problems the morning of surgery:  [phone]   Remember:  Do not eat food or drink liquids after midnight.  Take these medicines the morning of surgery with A SIP OF WATER Albuterol inhaler (also bring with you to hospital), Amlodipine   Do not wear jewelry, make-up or nail polish.  Do not wear lotions, powders, or perfumes.  You may wear deodorant.  Do not shave 48 hours prior to surgery.  Men may shave face and neck.  Do not bring valuables to the hospital.  [facility] is not responsible for any belongings or valuables.  Contacts, dentures or bridgework may not be worn into surgery.  Leave your suitcase in the car.  After surgery it may be brought to your room.  For patients admitted to the hospital, discharge time will be determined by your treatment team.  Patients discharged the day of surgery will not be allowed to drive home.    Please read over the following fact sheets that you were given. Surgical Site Infection Prevention and Anesthesia Post-op Instructions     PATIENT INSTRUCTIONS POST-ANESTHESIA  IMMEDIATELY FOLLOWING SURGERY:  Do not drive or operate machinery for the first twenty four hours after surgery.  Do not make any important decisions for twenty four hours after surgery or while taking narcotic pain medications or sedatives.  If you develop intractable nausea and vomiting or a severe headache please notify your doctor immediately.  FOLLOW-UP:  Please make an appointment with your surgeon as instructed. You do not need to follow up with anesthesia unless specifically instructed to do so.  WOUND CARE INSTRUCTIONS (if applicable):  Keep a dry clean dressing on the anesthesia/puncture wound site if there is drainage.  Once the wound has quit draining you may leave it open to air.  Generally you  should leave the bandage intact for twenty four hours unless there is drainage.  If the epidural site drains for more than 36-48 hours please call the anesthesia department.  QUESTIONS?:  Please feel free to call your physician or the hospital operator if you have any questions, and they will be happy to assist you.      Knee Arthroscopy Knee arthroscopy is a surgical procedure that is used to examine the inside of your knee joint and repair any damage. The surgeon puts a small, lighted instrument with a camera on the tip (arthroscope) through a small incision in your knee. The camera sends pictures to a monitor in the operating room. Your surgeon uses those pictures to guide the surgical instruments through other incisions to the area of damage. Knee arthroscopy can be used to treat many types of knee problems. It may be used:  To repair a torn ligament.  To repair or remove damaged tissue.  To remove a fluid-filled sac (cyst) from your knee. LET [facility] CARE PROVIDER KNOW ABOUT:  Any allergies you have.  All medicines you are taking, including vitamins, herbs, eye drops, creams, and over-the-counter medicines.  Previous problems you or members of your family have had with the use of anesthetics.  Any blood disorders you have.  Previous surgeries you have had.  Any medical conditions you may have. RISKS AND COMPLICATIONS Generally, this is a safe procedure. However, problems may occur, including:  Infection.  Bleeding.  Damage  to blood vessels, nerves, or structures of your knee.  A blood clot that forms in your leg and travels to your lung.  Failure to relieve symptoms. BEFORE THE PROCEDURE  Ask your health care provider about:  Changing or stopping your regular medicines. This is especially important if you are taking diabetes medicines or blood thinners.  Taking medicines such as aspirin and ibuprofen. These medicines can thin your blood. Do not take these  medicines before your procedure if your health care provider instructs you not to.  Follow your health care provider's instructions about eating or drinking restrictions.  Plan to have someone take you home after the procedure.  If you go home right after the procedure, plan to have someone with you for 24 hours.  Do not drink alcohol unless your health care provider says that you can.  Do not use any tobacco products, including cigarettes, chewing tobacco, or electronic cigarettes unless your health care provider says that you can. If you need help quitting, ask your health care provider.  You may have a physical exam. PROCEDURE  An IV tube will be inserted into one of your veins.  You will be given one or more of the following:  A medicine that helps you relax (sedative).  A medicine that numbs the area (local anesthetic).  A medicine that makes you fall asleep (general anesthetic).  A medicine that is injected into your spine that numbs the area below and slightly above the injection site (spinal anesthetic).  A medicine that is injected into an area of your body that numbs everything below the injection site (regional anesthetic).  A cuff may be placed around your upper leg to slow bleeding during the procedure.  The surgeon will make a small number of incisions around your knee.  Your knee joint will be flushed and filled with a germ-free (sterile) solution.  The arthroscope will be passed through an incision into your knee joint.  More instruments will be passed through other incisions to repair your knee as needed.  The fluid will be removed from your knee.  The incisions will be closed with adhesive strips or stitches (sutures).  A bandage (dressing) will be placed over your knee. The procedure may vary among health care providers and hospitals. AFTER THE PROCEDURE  Your blood pressure, heart rate, breathing rate and blood oxygen level will be monitored often  until the medicines you were given have worn off.  You may be given medicine for pain.  You may get crutches to help you walk without using your knee to support your body weight.  You may have to wear compression stockings. These stocking help to prevent blood clots and reduce swelling in your legs.   This information is not intended to replace advice given to you by your health care provider. Make sure you discuss any questions you have with your health care provider.   Document Released: 06/30/2000 Document Revised: 11/17/2014 Document Reviewed: 06/29/2014 Elsevier Interactive Patient Education [employer].

## 2015-10-12 ENCOUNTER — Encounter (HOSPITAL_COMMUNITY)
Admission: RE | Admit: 2015-10-12 | Discharge: 2015-10-12 | Disposition: A | Discharge status: Home / Self Care | Payer: Medicare Other | Source: Ambulatory Visit | Attending: Orthopedic Surgery | Admitting: Orthopedic Surgery

## 2015-10-12 ENCOUNTER — Ambulatory Visit (HOSPITAL_COMMUNITY)
Admission: RE | Admit: 2015-10-12 | Discharge: 2015-10-12 | Disposition: A | Discharge status: Home / Self Care | Payer: Medicare Other | Source: Ambulatory Visit | Attending: Orthopedic Surgery | Admitting: Orthopedic Surgery

## 2015-10-12 ENCOUNTER — Other Ambulatory Visit: Payer: Self-pay

## 2015-10-12 ENCOUNTER — Encounter (HOSPITAL_COMMUNITY): Payer: Self-pay

## 2015-10-12 ENCOUNTER — Ambulatory Visit (HOSPITAL_COMMUNITY): Payer: Self-pay

## 2015-10-12 DIAGNOSIS — Z01810 Encounter for preprocedural cardiovascular examination: Secondary | ICD-10-CM | POA: Insufficient documentation

## 2015-10-12 DIAGNOSIS — D860 Sarcoidosis of lung: Secondary | ICD-10-CM | POA: Insufficient documentation

## 2015-10-12 DIAGNOSIS — Z01818 Encounter for other preprocedural examination: Secondary | ICD-10-CM | POA: Diagnosis not present

## 2015-10-12 DIAGNOSIS — J9811 Atelectasis: Secondary | ICD-10-CM | POA: Insufficient documentation

## 2015-10-12 DIAGNOSIS — I440 Atrioventricular block, first degree: Secondary | ICD-10-CM | POA: Diagnosis not present

## 2015-10-12 DIAGNOSIS — R9431 Abnormal electrocardiogram [ECG] [EKG]: Secondary | ICD-10-CM | POA: Insufficient documentation

## 2015-10-12 LAB — BASIC METABOLIC PANEL
Anion gap: 9 (ref 5–15)
BUN: 34 mg/dL — ABNORMAL HIGH (ref 6–20)
CHLORIDE: 107 mmol/L (ref 101–111)
CO2: 26 mmol/L (ref 22–32)
Calcium: 9 mg/dL (ref 8.9–10.3)
Creatinine, Ser: 3.17 mg/dL — ABNORMAL HIGH (ref 0.61–1.24)
GFR calc non Af Amer: 19 mL/min — ABNORMAL LOW (ref >60)
GFR, EST AFRICAN AMERICAN: 22 mL/min — AB (ref >60)
Glucose, Bld: 149 mg/dL — ABNORMAL HIGH (ref 65–99)
POTASSIUM: 4.3 mmol/L (ref 3.5–5.1)
SODIUM: 142 mmol/L (ref 135–145)

## 2015-10-12 LAB — RAPID URINE DRUG SCREEN, HOSP PERFORMED
AMPHETAMINES: NOT DETECTED
BARBITURATES: NOT DETECTED
BENZODIAZEPINES: NOT DETECTED
Cocaine: NOT DETECTED
Opiates: NOT DETECTED
TETRAHYDROCANNABINOL: NOT DETECTED

## 2015-10-12 LAB — CBC
HEMATOCRIT: 36.7 % — AB (ref 39.0–52.0)
HEMOGLOBIN: 11.7 g/dL — AB (ref 13.0–17.0)
MCH: 26.2 pg (ref 26.0–34.0)
MCHC: 31.9 g/dL (ref 30.0–36.0)
MCV: 82.3 fL (ref 78.0–100.0)
Platelets: 143 10*3/uL — ABNORMAL LOW (ref 150–400)
RBC: 4.46 MIL/uL (ref 4.22–5.81)
RDW: 14.4 % (ref 11.5–15.5)
WBC: 4.8 10*3/uL (ref 4.0–10.5)
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
## 2015-10-14 ENCOUNTER — Ambulatory Visit (HOSPITAL_COMMUNITY)
Admission: RE | Admit: 2015-10-14 | Discharge: 2015-10-14 | Disposition: A | Discharge status: Home / Self Care | Payer: Medicare Other | Source: Ambulatory Visit | Attending: Orthopedic Surgery | Admitting: Orthopedic Surgery

## 2015-10-14 ENCOUNTER — Encounter (HOSPITAL_COMMUNITY)
Admission: RE | Disposition: A | Discharge status: Home / Self Care | Payer: Self-pay | Source: Ambulatory Visit | Attending: Orthopedic Surgery

## 2015-10-14 ENCOUNTER — Ambulatory Visit (HOSPITAL_COMMUNITY): Payer: Medicare Other | Admitting: Anesthesiology

## 2015-10-14 ENCOUNTER — Encounter (HOSPITAL_COMMUNITY): Payer: Self-pay

## 2015-10-14 DIAGNOSIS — M549 Dorsalgia, unspecified: Secondary | ICD-10-CM | POA: Insufficient documentation

## 2015-10-14 DIAGNOSIS — K219 Gastro-esophageal reflux disease without esophagitis: Secondary | ICD-10-CM | POA: Diagnosis not present

## 2015-10-14 DIAGNOSIS — E669 Obesity, unspecified: Secondary | ICD-10-CM | POA: Diagnosis not present

## 2015-10-14 DIAGNOSIS — E1122 Type 2 diabetes mellitus with diabetic chronic kidney disease: Secondary | ICD-10-CM | POA: Diagnosis not present

## 2015-10-14 DIAGNOSIS — R0602 Shortness of breath: Secondary | ICD-10-CM | POA: Diagnosis not present

## 2015-10-14 DIAGNOSIS — M23261 Derangement of other lateral meniscus due to old tear or injury, right knee: Secondary | ICD-10-CM | POA: Diagnosis not present

## 2015-10-14 DIAGNOSIS — Z833 Family history of diabetes mellitus: Secondary | ICD-10-CM | POA: Insufficient documentation

## 2015-10-14 DIAGNOSIS — Z8711 Personal history of peptic ulcer disease: Secondary | ICD-10-CM | POA: Insufficient documentation

## 2015-10-14 DIAGNOSIS — S83281A Other tear of lateral meniscus, current injury, right knee, initial encounter: Secondary | ICD-10-CM | POA: Diagnosis not present

## 2015-10-14 DIAGNOSIS — Z9981 Dependence on supplemental oxygen: Secondary | ICD-10-CM | POA: Insufficient documentation

## 2015-10-14 DIAGNOSIS — F4310 Post-traumatic stress disorder, unspecified: Secondary | ICD-10-CM | POA: Insufficient documentation

## 2015-10-14 DIAGNOSIS — S83289A Other tear of lateral meniscus, current injury, unspecified knee, initial encounter: Secondary | ICD-10-CM | POA: Insufficient documentation

## 2015-10-14 DIAGNOSIS — G8929 Other chronic pain: Secondary | ICD-10-CM | POA: Insufficient documentation

## 2015-10-14 DIAGNOSIS — J449 Chronic obstructive pulmonary disease, unspecified: Secondary | ICD-10-CM | POA: Insufficient documentation

## 2015-10-14 DIAGNOSIS — M1990 Unspecified osteoarthritis, unspecified site: Secondary | ICD-10-CM | POA: Diagnosis not present

## 2015-10-14 DIAGNOSIS — E785 Hyperlipidemia, unspecified: Secondary | ICD-10-CM | POA: Insufficient documentation

## 2015-10-14 DIAGNOSIS — R12 Heartburn: Secondary | ICD-10-CM | POA: Insufficient documentation

## 2015-10-14 DIAGNOSIS — Z8249 Family history of ischemic heart disease and other diseases of the circulatory system: Secondary | ICD-10-CM | POA: Insufficient documentation

## 2015-10-14 DIAGNOSIS — I129 Hypertensive chronic kidney disease with stage 1 through stage 4 chronic kidney disease, or unspecified chronic kidney disease: Secondary | ICD-10-CM | POA: Diagnosis not present

## 2015-10-14 DIAGNOSIS — J329 Chronic sinusitis, unspecified: Secondary | ICD-10-CM | POA: Insufficient documentation

## 2015-10-14 DIAGNOSIS — Z803 Family history of malignant neoplasm of breast: Secondary | ICD-10-CM | POA: Diagnosis not present

## 2015-10-14 DIAGNOSIS — E559 Vitamin D deficiency, unspecified: Secondary | ICD-10-CM | POA: Diagnosis not present

## 2015-10-14 DIAGNOSIS — M791 Myalgia: Secondary | ICD-10-CM | POA: Diagnosis not present

## 2015-10-14 DIAGNOSIS — Z9889 Other specified postprocedural states: Secondary | ICD-10-CM | POA: Insufficient documentation

## 2015-10-14 DIAGNOSIS — K648 Other hemorrhoids: Secondary | ICD-10-CM | POA: Diagnosis not present

## 2015-10-14 DIAGNOSIS — N184 Chronic kidney disease, stage 4 (severe): Secondary | ICD-10-CM | POA: Diagnosis not present

## 2015-10-14 DIAGNOSIS — Z8261 Family history of arthritis: Secondary | ICD-10-CM | POA: Insufficient documentation

## 2015-10-14 DIAGNOSIS — G4730 Sleep apnea, unspecified: Secondary | ICD-10-CM | POA: Insufficient documentation

## 2015-10-14 DIAGNOSIS — Z87891 Personal history of nicotine dependence: Secondary | ICD-10-CM | POA: Diagnosis not present

## 2015-10-14 DIAGNOSIS — Z6841 Body Mass Index (BMI) 40.0 and over, adult: Secondary | ICD-10-CM | POA: Insufficient documentation

## 2015-10-14 DIAGNOSIS — I440 Atrioventricular block, first degree: Secondary | ICD-10-CM | POA: Insufficient documentation

## 2015-10-14 HISTORY — PX: KNEE ARTHROSCOPY WITH LATERAL MENISECTOMY: SHX6193

## 2015-10-14 LAB — GLUCOSE, CAPILLARY
GLUCOSE-CAPILLARY: 127 mg/dL — AB (ref 65–99)
GLUCOSE-CAPILLARY: 134 mg/dL — AB (ref 65–99)

## 2015-10-14 SURGERY — ARTHROSCOPY, KNEE, WITH LATERAL MENISCECTOMY
Anesthesia: General | Site: Knee | Laterality: Right

## 2015-10-14 MED ORDER — FENTANYL CITRATE (PF) 100 MCG/2ML IJ SOLN
INTRAMUSCULAR | Status: AC
  Filled 2015-10-14: qty 2

## 2015-10-14 MED ORDER — ONDANSETRON HCL 4 MG/2ML IJ SOLN
4.0000 mg | Freq: Once | INTRAMUSCULAR | Status: AC
  Administered 2015-10-14: 4 mg via INTRAVENOUS

## 2015-10-14 MED ORDER — PROMETHAZINE HCL 12.5 MG PO TABS: 12.5000 mg | ORAL_TABLET | Freq: Four times a day (QID) | ORAL | Status: DC | PRN

## 2015-10-14 MED ORDER — PROPOFOL 10 MG/ML IV BOLUS
INTRAVENOUS | Status: DC | PRN
  Administered 2015-10-14: 90 mg via INTRAVENOUS
  Administered 2015-10-14: 150 mg via INTRAVENOUS

## 2015-10-14 MED ORDER — EPINEPHRINE HCL 1 MG/ML IJ SOLN
INTRAMUSCULAR | Status: AC
  Filled 2015-10-14: qty 3

## 2015-10-14 MED ORDER — BUPIVACAINE-EPINEPHRINE (PF) 0.5% -1:200000 IJ SOLN
INTRAMUSCULAR | Status: AC
  Filled 2015-10-14: qty 60

## 2015-10-14 MED ORDER — ONDANSETRON HCL 4 MG/2ML IJ SOLN
4.0000 mg | Freq: Once | INTRAMUSCULAR | Status: AC | PRN
  Administered 2015-10-14: 4 mg via INTRAVENOUS

## 2015-10-14 MED ORDER — BUPIVACAINE-EPINEPHRINE (PF) 0.5% -1:200000 IJ SOLN
INTRAMUSCULAR | Status: DC | PRN
  Administered 2015-10-14: 60 mL via PERINEURAL

## 2015-10-14 MED ORDER — PROPOFOL 10 MG/ML IV BOLUS
INTRAVENOUS | Status: AC
  Filled 2015-10-14: qty 40

## 2015-10-14 MED ORDER — LACTATED RINGERS IV SOLN
INTRAVENOUS | Status: DC
  Administered 2015-10-14: 09:00:00 via INTRAVENOUS

## 2015-10-14 MED ORDER — OXYCODONE-ACETAMINOPHEN 5-325 MG PO TABS: 1.0000 | ORAL_TABLET | ORAL | Status: DC | PRN

## 2015-10-14 MED ORDER — ONDANSETRON HCL 4 MG/2ML IJ SOLN
INTRAMUSCULAR | Status: AC
  Filled 2015-10-14: qty 2

## 2015-10-14 MED ORDER — CEFAZOLIN SODIUM-DEXTROSE 2-4 GM/100ML-% IV SOLN
INTRAVENOUS | Status: AC
  Filled 2015-10-14: qty 100

## 2015-10-14 MED ORDER — GLYCOPYRROLATE 0.2 MG/ML IJ SOLN
INTRAMUSCULAR | Status: AC
  Filled 2015-10-14: qty 1

## 2015-10-14 MED ORDER — DEXTROSE 5 % IV SOLN
2.0000 g | INTRAVENOUS | Status: AC
  Administered 2015-10-14: 2 g via INTRAVENOUS
  Filled 2015-10-14: qty 20

## 2015-10-14 MED ORDER — FENTANYL CITRATE (PF) 100 MCG/2ML IJ SOLN
INTRAMUSCULAR | Status: DC | PRN
  Administered 2015-10-14 (×3): 25 ug via INTRAVENOUS

## 2015-10-14 MED ORDER — SUCCINYLCHOLINE CHLORIDE 20 MG/ML IJ SOLN
INTRAMUSCULAR | Status: DC | PRN
  Administered 2015-10-14: 140 mg via INTRAVENOUS

## 2015-10-14 MED ORDER — SODIUM CHLORIDE 0.9 % IR SOLN
Status: DC | PRN
  Administered 2015-10-14 (×2): 3000 mL

## 2015-10-14 MED ORDER — MIDAZOLAM HCL 2 MG/2ML IJ SOLN
INTRAMUSCULAR | Status: AC
  Filled 2015-10-14: qty 2

## 2015-10-14 MED ORDER — GLYCOPYRROLATE 0.2 MG/ML IJ SOLN
0.2000 mg | Freq: Once | INTRAMUSCULAR | Status: AC
  Administered 2015-10-14: 0.2 mg via INTRAVENOUS

## 2015-10-14 MED ORDER — LACTATED RINGERS IV SOLN
INTRAVENOUS | Status: DC | PRN
  Administered 2015-10-14: 08:00:00 via INTRAVENOUS

## 2015-10-14 MED ORDER — MIDAZOLAM HCL 2 MG/2ML IJ SOLN
1.0000 mg | INTRAMUSCULAR | Status: DC | PRN
  Administered 2015-10-14: 2 mg via INTRAVENOUS

## 2015-10-14 MED ORDER — CHLORHEXIDINE GLUCONATE 4 % EX LIQD: 60.0000 mL | Freq: Once | CUTANEOUS | Status: DC

## 2015-10-14 MED ORDER — LIDOCAINE HCL (CARDIAC) 20 MG/ML IV SOLN
INTRAVENOUS | Status: DC | PRN
  Administered 2015-10-14: 25 mg via INTRAVENOUS

## 2015-10-14 MED ORDER — FENTANYL CITRATE (PF) 100 MCG/2ML IJ SOLN
25.0000 ug | INTRAMUSCULAR | Status: AC
  Administered 2015-10-14: 25 ug via INTRAVENOUS

## 2015-10-14 MED ORDER — FENTANYL CITRATE (PF) 100 MCG/2ML IJ SOLN
25.0000 ug | INTRAMUSCULAR | Status: DC | PRN
  Administered 2015-10-14 (×3): 25 ug via INTRAVENOUS
  Administered 2015-10-14: 50 ug via INTRAVENOUS
  Administered 2015-10-14: 25 ug via INTRAVENOUS

## 2015-10-14 SURGICAL SUPPLY — 64 items
ARTHROWAND PARAGON T2 (SURGICAL WAND)
BAG HAMPER (MISCELLANEOUS) ×2 IMPLANT
BANDAGE ELASTIC 6 VELCRO NS (GAUZE/BANDAGES/DRESSINGS) ×2 IMPLANT
BIT DRILL 2.0MX128MM (BIT) ×1 IMPLANT
BLADE 11 SAFETY STRL DISP (BLADE) ×1 IMPLANT
BLADE AGGRESSIVE PLUS 4.0 (BLADE) ×2 IMPLANT
BLADE SURG SZ11 CARB STEEL (BLADE) ×1 IMPLANT
CHLORAPREP W/TINT 26ML (MISCELLANEOUS) ×3 IMPLANT
CLOTH BEACON ORANGE TIMEOUT ST (SAFETY) ×2 IMPLANT
COOLER CRYO IC GRAV AND TUBE (ORTHOPEDIC SUPPLIES) ×2 IMPLANT
COVER PROBE W GEL 5X96 (DRAPES) ×1 IMPLANT
CUFF CRYO KNEE LG 20X31 COOLER (ORTHOPEDIC SUPPLIES) IMPLANT
CUFF CRYO KNEE18X23 MED (MISCELLANEOUS) ×1 IMPLANT
CUFF TOURNIQUET SINGLE 34IN LL (TOURNIQUET CUFF) ×1 IMPLANT
CUFF TOURNIQUET SINGLE 44IN (TOURNIQUET CUFF) IMPLANT
CUTTER ANGLED DBL BITE 4.5 (BURR) IMPLANT
DECANTER SPIKE VIAL GLASS SM (MISCELLANEOUS) ×4 IMPLANT
ELECT REM PT RETURN 9FT ADLT (ELECTROSURGICAL) ×2
ELECTRODE REM PT RTRN 9FT ADLT (ELECTROSURGICAL) IMPLANT
GAUZE SPONGE 4X4 12PLY STRL (GAUZE/BANDAGES/DRESSINGS) ×2 IMPLANT
GAUZE SPONGE 4X4 16PLY XRAY LF (GAUZE/BANDAGES/DRESSINGS) ×2 IMPLANT
GAUZE XEROFORM 5X9 LF (GAUZE/BANDAGES/DRESSINGS) ×2 IMPLANT
GLOVE BIO SURGEON STRL SZ7 (GLOVE) ×1 IMPLANT
GLOVE BIOGEL PI IND STRL 7.0 (GLOVE) ×1 IMPLANT
GLOVE BIOGEL PI INDICATOR 7.0 (GLOVE) ×2
GLOVE ECLIPSE 6.5 STRL STRAW (GLOVE) ×1 IMPLANT
GLOVE EXAM NITRILE MD LF STRL (GLOVE) ×1 IMPLANT
GLOVE SKINSENSE NS SZ8.0 LF (GLOVE) ×1
GLOVE SKINSENSE STRL SZ8.0 LF (GLOVE) ×1 IMPLANT
GLOVE SS N UNI LF 8.5 STRL (GLOVE) ×2 IMPLANT
GOWN PREVENTION PLUS XLARGE (GOWN DISPOSABLE) ×2 IMPLANT
GOWN STRL REUS W/TWL LRG LVL3 (GOWN DISPOSABLE) ×4 IMPLANT
GOWN STRL REUS W/TWL XL LVL3 (GOWN DISPOSABLE) ×2 IMPLANT
HLDR LEG FOAM (MISCELLANEOUS) ×1 IMPLANT
IV NS IRRIG 3000ML ARTHROMATIC (IV SOLUTION) ×4 IMPLANT
KIT BLADEGUARD II DBL (SET/KITS/TRAYS/PACK) ×2 IMPLANT
KIT ROOM TURNOVER AP CYSTO (KITS) ×2 IMPLANT
LEG HOLDER FOAM (MISCELLANEOUS) ×1
MANIFOLD NEPTUNE II (INSTRUMENTS) ×2 IMPLANT
MARKER SKIN DUAL TIP RULER LAB (MISCELLANEOUS) ×2 IMPLANT
NDL HYPO 18GX1.5 BLUNT FILL (NEEDLE) ×1 IMPLANT
NDL HYPO 21X1.5 SAFETY (NEEDLE) ×1 IMPLANT
NDL SPNL 18GX3.5 QUINCKE PK (NEEDLE) ×1 IMPLANT
NEEDLE HYPO 18GX1.5 BLUNT FILL (NEEDLE) ×2 IMPLANT
NEEDLE HYPO 21X1.5 SAFETY (NEEDLE) ×2 IMPLANT
NEEDLE SPNL 18GX3.5 QUINCKE PK (NEEDLE) ×2 IMPLANT
NS IRRIG 1000ML POUR BTL (IV SOLUTION) ×2 IMPLANT
PACK ARTHRO LIMB DRAPE STRL (MISCELLANEOUS) ×2 IMPLANT
PAD ABD 5X9 TENDERSORB (GAUZE/BANDAGES/DRESSINGS) ×2 IMPLANT
PAD ARMBOARD 7.5X6 YLW CONV (MISCELLANEOUS) ×2 IMPLANT
PADDING CAST COTTON 6X4 STRL (CAST SUPPLIES) ×2 IMPLANT
PADDING WEBRIL 6 STERILE (GAUZE/BANDAGES/DRESSINGS) ×1 IMPLANT
SET ARTHROSCOPY INST (INSTRUMENTS) ×2 IMPLANT
SET ARTHROSCOPY PUMP TUBE (IRRIGATION / IRRIGATOR) ×2 IMPLANT
SET BASIN LINEN APH (SET/KITS/TRAYS/PACK) ×2 IMPLANT
SPONGE GAUZE 4X4 12PLY (GAUZE/BANDAGES/DRESSINGS) ×1 IMPLANT
SUT ETHILON 3 0 FSL (SUTURE) ×2 IMPLANT
SYR 30ML LL (SYRINGE) ×2 IMPLANT
SYRINGE 10CC LL (SYRINGE) ×2 IMPLANT
WAND 50 DEG COVAC W/CORD (SURGICAL WAND) IMPLANT
WAND 90 DEG TURBOVAC W/CORD (SURGICAL WAND) IMPLANT
WAND ARTHRO PARAGON T2 (SURGICAL WAND) IMPLANT
WATER STERILE IRR 1000ML POUR (IV SOLUTION) ×2 IMPLANT
YANKAUER SUCT BULB TIP 10FT TU (MISCELLANEOUS) ×6 IMPLANT

## 2015-10-14 NOTE — Interval H&P Note (Signed)
History and Physical Interval Note:  10/14/2015 9:21 AM  [name] [name]  has presented today for surgery, with the diagnosis of RIGHT KNEE LATERAL MENISCUS TEAR  The various methods of treatment have been discussed with the patient and family. After consideration of risks, benefits and other options for treatment, the patient has consented to  Procedure(s) with comments: KNEE ARTHROSCOPY WITH LATERAL MENISECTOMY (Right) - has experience with crutches, no training needed as a surgical intervention .  The patient's history has been reviewed, patient examined, no change in status, stable for surgery.  I have reviewed the patient's chart and labs.  Questions were answered to the patient's satisfaction.     Arther Abbott

## 2015-10-14 NOTE — Transfer of Care (Signed)
Immediate Anesthesia Transfer of Care Note  Patient: [name] [name]  Procedure(s) Performed: Procedure(s): KNEE ARTHROSCOPY WITH LATERAL MENISECTOMY (Right)  Patient Location: PACU  Anesthesia Type:General  Level of Consciousness: awake, alert , oriented and patient cooperative  Airway & Oxygen Therapy: Patient Spontanous Breathing and Patient connected to face mask oxygen  Post-op Assessment: Report given to RN and Post -op Vital signs reviewed and stable  Post vital signs: Reviewed and stable  Last Vitals:  Filed Vitals:   10/14/15 0900 10/14/15 0905  BP: 150/104 139/92  Pulse:    Temp:    Resp: 27 16    Complications: No apparent anesthesia complications

## 2015-10-14 NOTE — Discharge Instructions (Signed)
Knee Arthroscopy, Care After Refer to this sheet in the next few weeks. These instructions provide you with information about caring for yourself after your procedure. Your health care provider may also give you more specific instructions. Your treatment has been planned according to current medical practices, but problems sometimes occur. Call your health care provider if you have any problems or questions after your procedure. WHAT TO EXPECT AFTER THE PROCEDURE After your procedure, it is common to have:  Soreness.  Pain. HOME CARE INSTRUCTIONS Bathing  Do not take baths, swim, or use a hot tub until your health care provider approves. Incision Care  There are many different ways to close and cover an incision, including stitches, skin glue, and adhesive strips. Follow your health care provider's instructions about:  Incision care.  Bandage (dressing) changes and removal.  Incision closure removal.  Check your incision area every day for signs of infection. Watch for:  Redness, swelling, or pain.  Fluid, blood, or pus. Activity  Avoid strenuous activities for as long as directed by your health care provider.  Return to your normal activities as directed by your health care provider. Ask your health care provider what activities are safe for you.  Perform range-of-motion exercises only as directed by your health care provider.  Do not lift anything that is heavier than 10 lb (4.5 kg).  Do not drive or operate heavy machinery while taking pain medicine.  If you were given crutches, use them as directed by your health care provider. Managing Pain, Stiffness, and Swelling  If directed, apply ice to the injured area:  Put ice in a plastic bag.  Place a towel between your skin and the bag.  Leave the ice on for 20 minutes, 2-3 times per day.  Raise the injured area above the level of your heart while you are sitting or lying down as directed by your health care  provider. General Instructions  Keep all follow-up visits as directed by your health care provider. This is important.  Take medicines only as directed by your health care provider.  Do not use any tobacco products, including cigarettes, chewing tobacco, or electronic cigarettes. If you need help quitting, ask your health care provider.  If you were given compression stockings, wear them as directed by your health care provider. These stockings help prevent blood clots and reduce swelling in your legs. SEEK MEDICAL CARE IF:  You have severe pain with any movement of your knee.  You notice a bad smell coming from the incision or dressing.  You have redness, swelling, or pain at the site of your incision.  You have fluid, blood, or pus coming from your incision. SEEK IMMEDIATE MEDICAL CARE IF:  You develop a rash.  You have a fever.  You have difficulty breathing or have shortness of breath.  You develop pain in your calves or in the back of your knee.  You develop chest pain.  You develop numbness or tingling in your leg or foot.   This information is not intended to replace advice given to you by your health care provider. Make sure you discuss any questions you have with your health care provider.   Document Released: 01/20/2005 Document Revised: 11/17/2014 Document Reviewed: 06/29/2014 Elsevier Interactive Patient Education 2016 [employer]   PATIENT INSTRUCTIONS POST-ANESTHESIA  IMMEDIATELY FOLLOWING SURGERY:  Do not drive or operate machinery for the first twenty four hours after surgery.  Do not make any important decisions for twenty four hours after  surgery or while taking narcotic pain medications or sedatives.  If you develop intractable nausea and vomiting or a severe headache please notify your doctor immediately.  FOLLOW-UP:  Please make an appointment with your surgeon as instructed. You do not need to follow up with anesthesia unless specifically  instructed to do so.  WOUND CARE INSTRUCTIONS (if applicable):  Keep a dry clean dressing on the anesthesia/puncture wound site if there is drainage.  Once the wound has quit draining you may leave it open to air.  Generally you should leave the bandage intact for twenty four hours unless there is drainage.  If the epidural site drains for more than 36-48 hours please call the anesthesia department.  QUESTIONS?:  Please feel free to call your physician or the hospital operator if you have any questions, and they will be happy to assist you.

## 2015-10-14 NOTE — Anesthesia Preprocedure Evaluation (Signed)
Anesthesia Evaluation  Patient identified by MRN, date of birth, ID band Patient awake    Reviewed: Allergy & Precautions, NPO status , Patient's Chart, lab work & pertinent test results  Airway Mallampati: II  TM Distance: >3 FB Neck ROM: Full    Dental no notable dental hx.    Pulmonary shortness of breath, COPD, former smoker,  Uses home O2   Pulmonary exam normal breath sounds clear to auscultation       Cardiovascular hypertension, Pt. on medications Normal cardiovascular exam Rhythm:Regular Rate:Normal     Neuro/Psych negative neurological ROS  negative psych ROS   GI/Hepatic negative GI ROS, Neg liver ROS, PUD, GERD  Medicated and Controlled,  Endo/Other  diabetes, Type 2, Insulin DependentMorbid obesity  Renal/GU CRF and Renal InsufficiencyRenal disease  negative genitourinary   Musculoskeletal negative musculoskeletal ROS (+)   Abdominal   Peds negative pediatric ROS (+)  Hematology negative hematology ROS (+)   Anesthesia Other Findings   Reproductive/Obstetrics negative OB ROS                             Anesthesia Physical Anesthesia Plan  ASA: III  Anesthesia Plan: General   Post-op Pain Management:    Induction: Intravenous  Airway Management Planned: LMA  Additional Equipment:   Intra-op Plan:   Post-operative Plan: Extubation in OR  Informed Consent: I have reviewed the patients History and Physical, chart, labs and discussed the procedure including the risks, benefits and alternatives for the proposed anesthesia with the patient or authorized representative who has indicated his/her understanding and acceptance.     Plan Discussed with:   Anesthesia Plan Comments: (Possible GOT discussed and he agrees with plan.)        Anesthesia Quick Evaluation

## 2015-10-14 NOTE — Transfer of Care (Addendum)
Immediate Anesthesia Transfer of Care Note  Patient: [name] [name]  Procedure(s) Performed: Procedure(s) with comments: KNEE ARTHROSCOPY WITH LATERAL MENISECTOMY (Right) - has experience with crutches, no training needed  Patient Location: PACU  Anesthesia Type:General  Level of Consciousness: awake, alert , oriented and patient cooperative  Airway & Oxygen Therapy: Patient Spontanous Breathing and Patient connected to nasal cannula oxygen  Post-op Assessment: Report given to RN, Post -op Vital signs reviewed and stable and Patient moving all extremities X 4  Post vital signs: Reviewed and stable  Last Vitals:  Filed Vitals:   10/14/15 0900 10/14/15 0905  BP: 150/104 139/92  Pulse:    Temp:    Resp: 27 16    Complications: No apparent anesthesia complications  This Transfer of Care note entered in error.

## 2015-10-14 NOTE — Op Note (Signed)
10/14/2015  10:30 AM  PATIENT:  [name] [name]  65 y.o. male  PRE-OPERATIVE DIAGNOSIS:  RIGHT KNEE LATERAL MENISCUS TEAR  POST-OPERATIVE DIAGNOSIS:  RIGHT KNEE LATERAL MENISCUS TEAR  PROCEDURE:  Procedure(s): KNEE ARTHROSCOPY WITH LATERAL MENISECTOMY (Right)  Small tear body radial lateral meniscus; remaining portion of the joint looked excellent and his old quadriceps repair looked very good as well   SURGEON:  Surgeon(s) and Role:    * Carole Civil, MD - Primary  PHYSICIAN ASSISTANT:   ASSISTANTS: none   ANESTHESIA:   none  EBL:  Total I/O In: 600 [I.V.:600] Out: 0   BLOOD ADMINISTERED:none  DRAINS: none   LOCAL MEDICATIONS USED:  MARCAINE     SPECIMEN:  No Specimen  DISPOSITION OF SPECIMEN:  N/A  COUNTS:  YES  TOURNIQUET:    DICTATION: .Dragon Dictation  PLAN OF CARE: Discharge to home after PACU  PATIENT DISPOSITION:  PACU - hemodynamically stable.   Delay start of Pharmacological VTE agent (>24hrs) due to surgical blood loss or risk of bleeding: no  Knee arthroscopy dictation  The patient was identified in the preoperative holding area using 2 approved identification mechanisms. The chart was reviewed and updated. The surgical site was confirmed as right knee and marked with an indelible marker.  The patient was taken to the operating room for anesthesia. After successful  general anesthesia, Ancef was used as IV antibiotics.  The patient was placed in the supine position with the (right) the operative extremity in an arthroscopic leg holder and the opposite extremity in a padded leg holder.  The timeout was executed.  A lateral portal was established with an 11 blade and the scope was introduced into the joint. A diagnostic arthroscopy was performed in circumferential manner examining the entire knee joint. A medial portal was established and the diagnostic arthroscopy was repeated using a probe to palpate intra-articular structures as they were  encountered.   The operative findings are   Medial normal Lateral radial tear lateral meniscus body Patellofemoral normal with good reattachment of the quadriceps tendon from previous repair Notch normal   The lateral meniscus was resected using a duckbill forceps. The meniscal fragments were removed with a motorized shaver. The meniscus was balanced with a combination of a motorized shaver and a 50 ArthroCare wand until a stable rim was obtained.    The arthroscopic pump was placed on the wash mode and any excess debris was removed from the joint using suction.  60 cc of Marcaine with epinephrine was injected through the arthroscope.  The portals were closed with 3-0 nylon suture.  A sterile bandage, Ace wrap and Cryo/Cuff was placed and the Cryo/Cuff was activated. The patient was taken to the recovery room in stable condition.  Postop plan Weight-bear as tolerated Early range of motion Follow-up within a week

## 2015-10-14 NOTE — Anesthesia Procedure Notes (Addendum)
Procedure Name: LMA Insertion Date/Time: 10/14/2015 9:38 AM Performed by: Andree Elk, AMY A Pre-anesthesia Checklist: Patient identified, Timeout performed, Emergency Drugs available, Suction available and Patient being monitored Patient Re-evaluated:Patient Re-evaluated prior to inductionOxygen Delivery Method: Circle system utilized Preoxygenation: Pre-oxygenation with 100% oxygen Intubation Type: IV induction Ventilation: Two handed mask ventilation required LMA: LMA inserted LMA Size: 5.0 Number of attempts: 1 Placement Confirmation: positive ETCO2 and breath sounds checked- equal and bilateral Tube secured with: Tape Dental Injury: Teeth and Oropharynx as per pre-operative assessment    Procedure Name: Intubation Date/Time: 10/14/2015 9:44 AM Performed by: Andree Elk, AMY A Pre-anesthesia Checklist: Patient identified, Patient being monitored, Timeout performed, Emergency Drugs available and Suction available Patient Re-evaluated:Patient Re-evaluated prior to inductionOxygen Delivery Method: Circle System Utilized Preoxygenation: Pre-oxygenation with 100% oxygen Intubation Type: IV induction Ventilation: Two handed mask ventilation required Laryngoscope Size: Glidescope Grade View: Grade I Tube type: Oral Tube size: 7.0 mm Number of attempts: 1 Airway Equipment and Method: Stylet Placement Confirmation: ETT inserted through vocal cords under direct vision,  positive ETCO2 and breath sounds checked- equal and bilateral Secured at: 21 cm Tube secured with: Tape Dental Injury: Teeth and Oropharynx as per pre-operative assessment

## 2015-10-14 NOTE — Anesthesia Postprocedure Evaluation (Signed)
Anesthesia Post Note  Patient: [name] [name]  Procedure(s) Performed: Procedure(s) (LRB): LEFT KNEE ARTHROSCOPY WITH PARTIAL LATERAL MENISECTOMY (Right)  Patient location during evaluation: PACU Anesthesia Type: General Level of consciousness: awake and alert, oriented and patient cooperative Pain management: pain level controlled Vital Signs Assessment: post-procedure vital signs reviewed and stable Respiratory status: spontaneous breathing and patient connected to face mask oxygen Cardiovascular status: stable Postop Assessment: no signs of nausea or vomiting Anesthetic complications: no    Last Vitals:  Filed Vitals:   10/14/15 0905 10/14/15 1048  BP: 139/92   Pulse:    Temp:  36.6 C  Resp: 16     Last Pain: There were no vitals filed for this visit.               Normand Damron A

## 2015-10-14 NOTE — Brief Op Note (Addendum)
10/14/2015  10:30 AM  PATIENT:  [name] [name]  65 y.o. male  PRE-OPERATIVE DIAGNOSIS:  RIGHT KNEE LATERAL MENISCUS TEAR  POST-OPERATIVE DIAGNOSIS:  RIGHT KNEE LATERAL MENISCUS TEAR  PROCEDURE:  Procedure(s): KNEE ARTHROSCOPY WITH LATERAL MENISECTOMY (Right)  Small tear body radial lateral meniscus; remaining portion of the joint looked excellent and his old quadriceps repair looked very good as well   SURGEON:  Surgeon(s) and Role:    * Carole Civil, MD - Primary  PHYSICIAN ASSISTANT:   ASSISTANTS: none   ANESTHESIA:   none  EBL:  Total I/O In: 600 [I.V.:600] Out: 0   BLOOD ADMINISTERED:none  DRAINS: none   LOCAL MEDICATIONS USED:  MARCAINE     SPECIMEN:  No Specimen  DISPOSITION OF SPECIMEN:  N/A  COUNTS:  YES  TOURNIQUET:    DICTATION: .Dragon Dictation  PLAN OF CARE: Discharge to home after PACU  PATIENT DISPOSITION:  PACU - hemodynamically stable.   Delay start of Pharmacological VTE agent (>24hrs) due to surgical blood loss or risk of bleeding: no  Knee arthroscopy dictation  The patient was identified in the preoperative holding area using 2 approved identification mechanisms. The chart was reviewed and updated. The surgical site was confirmed as right knee and marked with an indelible marker.  The patient was taken to the operating room for anesthesia. After successful  general anesthesia, Ancef was used as IV antibiotics.  The patient was placed in the supine position with the (right) the operative extremity in an arthroscopic leg holder and the opposite extremity in a padded leg holder.  The timeout was executed.  A lateral portal was established with an 11 blade and the scope was introduced into the joint. A diagnostic arthroscopy was performed in circumferential manner examining the entire knee joint. A medial portal was established and the diagnostic arthroscopy was repeated using a probe to palpate intra-articular structures as they were  encountered.   The operative findings are   Medial normal Lateral radial tear lateral meniscus body Patellofemoral normal with good reattachment of the quadriceps tendon from previous repair Notch normal   The lateral meniscus was resected using a duckbill forceps. The meniscal fragments were removed with a motorized shaver. The meniscus was balanced with a combination of a motorized shaver and a 50 ArthroCare wand until a stable rim was obtained.    The arthroscopic pump was placed on the wash mode and any excess debris was removed from the joint using suction.  60 cc of Marcaine with epinephrine was injected through the arthroscope.  The portals were closed with 3-0 nylon suture.  A sterile bandage, Ace wrap and Cryo/Cuff was placed and the Cryo/Cuff was activated. The patient was taken to the recovery room in stable condition.  Postop plan Weight-bear as tolerated Early range of motion Follow-up within a week

## 2015-10-15 ENCOUNTER — Encounter (HOSPITAL_COMMUNITY): Payer: Self-pay | Admitting: Orthopedic Surgery

## 2015-10-15 NOTE — Progress Notes (Signed)
Called pt for follow-up surgical call. Pt c/o sore throat after surgery and states throat was worse today and spitting up blood. Dr Patsey Berthold and Dr Aline Brochure notified. Per Dr Patsey Berthold: difficult intubation. Tissue very friable. Continue gargling with warm salt water. Should improve in 2-3 days. Pt satisfied with response.

## 2015-10-19 ENCOUNTER — Telehealth: Payer: Self-pay | Admitting: Internal Medicine

## 2015-10-19 NOTE — Telephone Encounter (Signed)
Faxed signed clearance for [facility] in [place] for patient to hold PLAVIX 75mg  for 5-7 days prior to procedure R/F on April 25th.

## 2015-10-22 ENCOUNTER — Ambulatory Visit (INDEPENDENT_AMBULATORY_CARE_PROVIDER_SITE_OTHER): Payer: Self-pay | Admitting: Orthopedic Surgery

## 2015-10-22 ENCOUNTER — Encounter: Payer: Self-pay | Admitting: Orthopedic Surgery

## 2015-10-22 VITALS — BP 147/81 | Ht 64.0 in | Wt 246.0 lb

## 2015-10-22 DIAGNOSIS — Z9889 Other specified postprocedural states: Secondary | ICD-10-CM

## 2015-10-22 MED ORDER — OXYCODONE-ACETAMINOPHEN 5-325 MG PO TABS: 1.0000 | ORAL_TABLET | Freq: Four times a day (QID) | ORAL | Status: DC | PRN

## 2015-10-22 NOTE — Patient Instructions (Addendum)
Call [facility] therapy dept to schedule therapy visits [phone]  You can stop using the crutches whenever you're comfortable  Activity as tolerated but no labor or work limit activity to walking  Continue to apply ice 3 times a day using a Cryo/Cuff or an ice pack, do this and to come back for your next office visit

## 2015-10-22 NOTE — Progress Notes (Signed)
Patient ID: [name] [name], male   DOB: Aug 01, 1950, 65 y.o.   MRN: [id number]   POST OP VISIT  S/P KNEE ARTHROSCOPY  Chief Complaint  Patient presents with  . Follow-up    POST OP 1, SARK LM, DOS 10/14/15     BP 147/81 mmHg  Ht 5\' 4"  (1.626 m)  Wt 246 lb (111.585 kg)  BMI 42.21 kg/m2  The surgical sites look clean dry and intact.  He is walking on crutches he is complaining of pain he complains that sometimes he can extend his knee he also complains of throat pain sore throat burning pain down to his stomach. He talked to the anesthesiologist and they gave him instructions on gargling  I advised him to work through these things he had a small meniscal tear he should be able to make it through the surgery fairly easily  I'll see him in 6 weeks  ASSESSMENT AND PLAN   Start physical therapy and arrange follow-up visit

## 2015-10-25 ENCOUNTER — Ambulatory Visit (HOSPITAL_COMMUNITY): Payer: Medicare Other | Attending: Orthopedic Surgery | Admitting: Physical Therapy

## 2015-10-25 DIAGNOSIS — M25661 Stiffness of right knee, not elsewhere classified: Secondary | ICD-10-CM | POA: Diagnosis not present

## 2015-10-25 DIAGNOSIS — R262 Difficulty in walking, not elsewhere classified: Secondary | ICD-10-CM | POA: Diagnosis not present

## 2015-10-25 DIAGNOSIS — M6281 Muscle weakness (generalized): Secondary | ICD-10-CM | POA: Diagnosis not present

## 2015-10-25 DIAGNOSIS — M25561 Pain in right knee: Secondary | ICD-10-CM

## 2015-10-25 DIAGNOSIS — R600 Localized edema: Secondary | ICD-10-CM
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
## 2015-10-25 NOTE — Patient Instructions (Signed)
KNEE: Extension (Isometric)    Lie on back, legs straight. Tighten quadriceps by pushing back of knee into surface. Hold _3__ seconds. _10__ reps per set, 2___ sets per day, _at least 5__ days per week   Copyright  VHI. All rights reserved.   Long Arc Quad     LONG ARC QUAD - LAQ - HIGH SEAT  While seated with your knee in a bent position, and your left leg behind the right, slowly straighten your knee- use your left leg to help out as much as you need to avoid high pain levels, but still do as much as you can with your right leg.   Repeat 10 times, twice a day, at least 5 days per week.       WEIGHT SHIFT - LATERAL  While in a standing position and knees partially bent, slowly shift your body weight side-to-side.  Repeat 10-20 times, twice a day, 5 days a week.   RETROGRADE MASSAGE  Starting at your calf, gently run your hands up your leg to just above your knee, taking care to avoid your incisions for now (until they heal up). You do not need to use a lot of pressure, just gently rub the skin for 3-5 minutes, 1-2 times per day.

## 2015-10-29 ENCOUNTER — Ambulatory Visit (HOSPITAL_COMMUNITY): Payer: Medicare Other

## 2015-10-29 DIAGNOSIS — R600 Localized edema: Secondary | ICD-10-CM

## 2015-10-29 DIAGNOSIS — M6281 Muscle weakness (generalized): Secondary | ICD-10-CM | POA: Diagnosis not present

## 2015-10-29 DIAGNOSIS — M25661 Stiffness of right knee, not elsewhere classified: Secondary | ICD-10-CM

## 2015-10-29 DIAGNOSIS — R262 Difficulty in walking, not elsewhere classified: Secondary | ICD-10-CM

## 2015-10-29 DIAGNOSIS — M25561 Pain in right knee: Secondary | ICD-10-CM | POA: Diagnosis not present

## 2015-10-29 NOTE — Therapy (Signed)
[place], [place], [address] Phone: [phone]   Fax:  [phone]  Physical Therapy Treatment  Patient Details  Name: [name] [name] MRN: [id number] Date of Birth: 1951/03/08 Referring Provider: Arther Abbott   Encounter Date: 10/29/2015      PT End of Session - 10/29/15 0816    Number of Visits 12   Date for PT Re-Evaluation 11/22/15   Authorization Type Medicare    Authorization Time Period 10/25/15 to 11/24/15   Authorization - Visit Number 2   Authorization - Number of Visits 10   PT Start Time 0813   PT Stop Time 0903   PT Time Calculation (min) 50 min   Activity Tolerance Patient tolerated treatment well   Behavior During Therapy [facility] for tasks assessed/performed      Past Medical History  Diagnosis Date  . COPD (chronic obstructive pulmonary disease) ([place])   . Sinusitis   . PUD (peptic ulcer disease)     remote, reports f/u EGD about 8 years ago unremarkable   . Diabetes ([place])   . GERD (gastroesophageal reflux disease)   . Hyperlipidemia   . Hypertension   . Post traumatic stress disorder   . Obesity     Truncal  . Heart disease     family history  . Hepatomegaly     noted on noncontrast CT 2015  . Vitamin D deficiency   . Chronic back pain   . Chronic shoulder pain   . Chronic knee pain   . Chronic pain   . Sleep apnea     cpap - settings at 4   . Shortness of breath dyspnea     with exertion   . Arthritis   . On home oxygen therapy     uses 2l when is going somewhere per patient   . Chronic kidney disease     stage 4 per office visit note of Dr Lowanda Foster on 05/2015   . Chronic sinusitis   . Reactive airway disease     related to exposure to chemical during 9/11  . Bell palsy     Past Surgical History  Procedure Laterality Date  . Asad lt shoulder  12/2008    left shoulder  . Umbilical hernia repair  2007    [place]  . Knee arthroscopy  10/2007    left  . Toenail  excision      removed x2-bilateral  . Eye surgery  12/22/2010    tear duct probing-[place]  . Foreign body removal  03/29/2011    Procedure: REMOVAL FOREIGN BODY EXTREMITY;  Surgeon: Arther Abbott, MD;  Location: [facility];  Service: Orthopedics;  Laterality: Right;  Removal Foreign Body Right Thumb  . Quadriceps tendon repair  07/21/2011    Procedure: REPAIR QUADRICEP TENDON;  Surgeon: Arther Abbott, MD;  Location: [facility];  Service: Orthopedics;  Laterality: Right;  . Nm myoview ltd    . Doppler echocardiography    . Left heart catheterization with coronary angiogram N/A 03/28/2013    Procedure: LEFT HEART CATHETERIZATION WITH CORONARY ANGIOGRAM;  Surgeon: Leonie Man, MD;  Location: [facility] CATH LAB;  Service: Cardiovascular;  Laterality: N/A;  . Colonoscopy  10/2008    Fields: Rectal polyp obliterated, not retrieved, hemorrhoids, single ascending colon diverticulum near the CV. Next colonoscopy April 2020  . Colonoscopy N/A 12/25/2014    SLF: 1. Colorectal polyps (2) removed 2. Small internal hemorrhoids 3. the left colon is severely redundant  .  Esophagogastroduodenoscopy N/A 12/25/2014    SLF: 1. Anemia most likely due to CRI, gastritis, gastric polyps 2. Moderate non-erosive gastriits and mild duodenitis.  3.TWo large gstric polyps removed.   Marland Kitchen Penile prosthesis implant N/A 08/16/2015    Procedure: PENILE PROTHESIS INFLATABLE, three piece, Excisional biopsy of Penile ulcer, Penile molding;  Surgeon: Carolan Clines, MD;  Location: [facility];  Service: Urology;  Laterality: N/A;  . Knee arthroscopy with lateral menisectomy Right 10/14/2015    Procedure: LEFT KNEE ARTHROSCOPY WITH PARTIAL LATERAL MENISECTOMY;  Surgeon: Carole Civil, MD;  Location: [facility];  Service: Orthopedics;  Laterality: Right;    There were no vitals filed for this visit.      Subjective Assessment - 10/29/15 0808    Subjective Pt stated knee is feeling good today, no reports of pain just a little stiff this  morning.  Reports compliance with HEP without questions.   Pertinent History OSA, DM with CKD stage 4, LBP, GERD, SOB with dyspnea, PTSD    Patient Stated Goals get back to PLOF    Currently in Pain? No/denies            [facility] Adult PT Treatment/Exercise - 10/29/15 0001    Exercises   Exercises Knee/Hip   Knee/Hip Exercises: Stretches   Active Hamstring Stretch Right;3 reps;30 seconds   Active Hamstring Stretch Limitations supine   Quad Stretch 3 reps;30 seconds   Quad Stretch Limitations prone with rope   Knee/Hip Exercises: Aerobic   Stationary Bike 5' for knee mobilty seat 10   Knee/Hip Exercises: Standing   Rocker Board 2 minutes   Rocker Board Limitations R/L and A/P   SLS Rt 47", Lt 37"   Gait Training 226 feet cueing for heel to toe pattern   Knee/Hip Exercises: Seated   Long Arc Quad Both;15 reps   Long Arc Quad Weight 2 lbs.   Knee/Hip Exercises: Supine   Quad Sets 15 reps   Short Arc Quad Sets 15 reps   Heel Slides 15 reps   Heel Slides Limitations 0-124 degrees   Straight Leg Raises 10 reps   Straight Leg Raises Limitations 2#   Knee/Hip Exercises: Prone   Hip Extension 10 reps            PT Short Term Goals - 10/29/15 0913    PT SHORT TERM GOAL #1   Title Patient will demosntrate R knee ROM of 0-120 degrees in order to assist in improving overall mobility and gait, reduce overall pain    Baseline 10/29/2015: AROM 0-124 degrees   Time 2   Period Weeks   Status Achieved   PT SHORT TERM GOAL #2   Title Patient to demonstrate improved gait mechanics, including even step length/stance times, consistent heel-toe pattern, no unsteadiness, in order to improve overall mobilty    Status On-going   PT SHORT TERM GOAL #3   Title Patient to report no more than 3/10 pain with all functional tasks and activities in R knee in order to assist in returning to PLOF, including re-achieving a full night's sleep on a consistent basis    Status On-going   PT SHORT TERM  GOAL #4   Title Patient to be independent in appropriate HEP, to be updated PRN    Status On-going           PT Long Term Goals - 10/25/15 1615    PT LONG TERM GOAL #1   Title Patient to demonstrate strength 5/5 in all tested  muscle groups in order to enhance regional stability, reduce pain, and assist in returning to PLOF    Time 4   Period Weeks   Status New   PT LONG TERM GOAL #2   Title Patient to experience no more than 2/10 in R knee during all functional activities and physical tasks in order to assist in returning to PLOF with minimal discomfort    Time 4   Period Weeks   Status New   PT LONG TERM GOAL #3   Title Patient will be able to reciporcally ascend/descend full flight of stairs with reciprocal pattern, good eccentric control, and minimal unsteadiness  in order to assist in improving mobility in community and at home    Time 4   Period Weeks   Status New   PT LONG TERM GOAL #4   Title Patient to report he has been able to make a full return to PLOF, including regular gardening, with R knee pain no more than 2/10 in order to return to all desired and required activities and improve QOL    Time 4   Period Weeks   Status New               Plan - 10/29/15 [id number]    Clinical Impression Statement Reviewed goals, compliance and assured correct technique with HEP and copy of eval given to pt.  Session focus on improving knee mobilty and LE strengthening.  Pt making great gain towards goals.  Pt able to demonstrate appropriate form with all exercises following cueing for form, control and technique.  Gait training with cueing for heel to toe pattern and equal stance phase with minimal cueing required.  Ended session with recumbent bicycle seat 10, pt able to make full revolution.  AROM increased 0-124 degrees at end of session  No reports of pain, pt encouraged to apply ice for pain and edema control following sesion.    Rehab Potential Good   PT Frequency 3x / week    PT Duration 4 weeks   PT Treatment/Interventions ADLs/Self Care Home Management;Biofeedback;Cryotherapy;Gait training;Stair training;Functional mobility training;Therapeutic activities;Therapeutic exercise;Balance training;Neuromuscular re-education;Patient/family education;Manual techniques;Scar mobilization;Passive range of motion;Energy conservation;Taping   PT Next Visit Plan Next session progress to close chain activities as ROM is WNL, continue functional stretches and strenghtening, gait training, manual for pain and edema control PRN.      Patient will benefit from skilled therapeutic intervention in order to improve the following deficits and impairments:  Abnormal gait, Decreased skin integrity, Hypomobility, Decreased strength, Pain, Difficulty walking, Improper body mechanics, Decreased coordination, Impaired flexibility  Visit Diagnosis: Stiffness of right knee, not elsewhere classified  Pain in right knee  Localized edema  Muscle weakness (generalized)  Difficulty in walking, not elsewhere classified     Problem List Patient Active Problem List   Diagnosis Date Noted  . Lateral meniscus tear   . Erectile dysfunction 08/16/2015  . Hemorrhoids 07/05/2015  . Vitamin D deficiency 06/04/2015  . OSA on CPAP 05/14/2015  . Anemia 03/24/2015  . Hepatomegaly 12/01/2014  . Abnormal CT of liver 12/01/2014  . IDA (iron deficiency anemia) 12/01/2014  . Abnormal EKG 05/29/2014  . Chronic low back pain 06/16/2013  . Arthritis pain of hand 06/10/2013  . Back pain 06/05/2013  . Abnormal nuclear stress test 03/28/2013  . Muscle weakness (generalized) 05/20/2012  . Partial nontraumatic rupture of rotator cuff 05/15/2012  . Shoulder arthritis 05/15/2012  . Rotator cuff tear, right 04/25/2012  . Knee pain 11/29/2011  .  Difficulty in walking(719.7) 09/04/2011  . Stiffness of joint, not elsewhere classified, lower leg 09/04/2011  . Rupture quadriceps tendon 08/03/2011  .  Quadriceps tendon rupture 07/20/2011  . Foreign body of thumb, right 03/28/2011  . OA (osteoarthritis) of knee 03/15/2011  . Chronic pain 03/15/2011  . CTS (carpal tunnel syndrome) 03/15/2011  . KNEE, ARTHRITIS, DEGEN./OSTEO 09/09/2009  . DERANGEMENT MENISCUS 08/19/2009  . SHOULDER, ARTHRITIS, DEGEN./OSTEO 04/05/2009  . RUPTURE ROTATOR CUFF 12/28/2008  . BACK PAIN 10/21/2008  . Type 2 diabetes mellitus with stage 4 chronic kidney disease ([place]) 09/29/2008  . Hyperlipidemia 09/29/2008  . Essential hypertension 09/29/2008  . GERD 09/29/2008  . HEMATOCHEZIA 09/29/2008  . TENDINITIS, CALCIFIC, SHOULDER, LEFT 08/06/2008  . SHOULDER PAIN 06/29/2008  . IMPINGEMENT SYNDROME 06/29/2008   Ihor Austin, LPTA; [facility]  [address] 10/29/2015, [place], [place], [address] Phone: [phone]   Fax:  [phone]  Name: [name] [name] MRN: [id number] Date of Birth: [birth year]

## 2015-11-02 ENCOUNTER — Encounter: Payer: Self-pay | Admitting: Nurse Practitioner

## 2015-11-02 ENCOUNTER — Ambulatory Visit (INDEPENDENT_AMBULATORY_CARE_PROVIDER_SITE_OTHER): Payer: Medicare Other | Admitting: Nurse Practitioner

## 2015-11-02 VITALS — BP 164/107 | HR 72 | Ht 64.0 in | Wt 244.6 lb

## 2015-11-02 DIAGNOSIS — I10 Essential (primary) hypertension: Secondary | ICD-10-CM

## 2015-11-02 DIAGNOSIS — I2510 Atherosclerotic heart disease of native coronary artery without angina pectoris: Secondary | ICD-10-CM | POA: Diagnosis not present

## 2015-11-02 DIAGNOSIS — Z794 Long term (current) use of insulin: Secondary | ICD-10-CM

## 2015-11-02 DIAGNOSIS — E785 Hyperlipidemia, unspecified: Secondary | ICD-10-CM

## 2015-11-02 DIAGNOSIS — N184 Chronic kidney disease, stage 4 (severe): Secondary | ICD-10-CM

## 2015-11-02 DIAGNOSIS — Z0000 Encounter for general adult medical examination without abnormal findings: Secondary | ICD-10-CM | POA: Diagnosis not present

## 2015-11-02 DIAGNOSIS — E1122 Type 2 diabetes mellitus with diabetic chronic kidney disease: Secondary | ICD-10-CM

## 2015-11-02 DIAGNOSIS — N5201 Erectile dysfunction due to arterial insufficiency: Secondary | ICD-10-CM | POA: Diagnosis not present

## 2015-11-02 NOTE — Patient Instructions (Signed)
[name] Bayley, NP, recommends that you continue on your current medications as directed. Please refer to the Current Medication list given to you today.  Your physician has requested that you monitor and record your blood pressure readings at home daily. Please use the same machine at the same time of day to check your readings and record. Please call the office, [phone], to speak with a nurse regarding your readings.  [name] recommends that you schedule a follow-up appointment in 6 months with Dr Debara Pickett. You will receive a reminder letter in the mail two months in advance. If you don't receive a letter, please call our office to schedule the follow-up appointment.  If you need a refill on your cardiac medications before your next appointment, please call your pharmacy.

## 2015-11-02 NOTE — Progress Notes (Signed)
Office Visit    Patient Name: [name] [name] Date of Encounter: 11/02/2015  Primary Care Provider:  [name], MD Primary Cardiologist:  C. Hilty, MD   Chief Complaint     65 year old male with a history of hypertension, hyperlipidemia , diabetes, nonobstructive CAD, who presents for follow-up.  Past Medical History    Past Medical History  Diagnosis Date  . COPD (chronic obstructive pulmonary disease) ([place])   . Sinusitis   . PUD (peptic ulcer disease)     remote, reports f/u EGD about 8 years ago unremarkable   . GERD (gastroesophageal reflux disease)   . Hyperlipidemia   . Essential hypertension   . Post traumatic stress disorder   . Obesity     [place]un[place]  . CAD (coronary artery disease)     a. 2014 MV: abnl w/ infap ischemia; b. 03/2013 Cath: aneurysmal bleb in the LAD w/ otw nonobs dzs-->Med Rx.  . Hepatomegaly     noted on noncontrast CT 2015  . Vitamin D deficiency   . Chronic back pain   . Chronic shoulder pain   . Chronic knee pain     a. 09/2015 s/p R TKA.  Marland Kitchen Chronic pain   . Obstructive sleep apnea     cpap - settings at 4   . Arthritis   . On home oxygen therapy     uses 2l when is going somewhere per patient   . CKD (chronic kidney disease), stage IV ([place])     stage 4 per office visit note of Dr Lowanda Foster on 05/2015   . Chronic sinusitis   . Reactive airway disease     related to exposure to chemical during 9/11  . Bell palsy    Past Surgical History  Procedure Laterality Date  . Asad lt shoulder  12/2008    left shoulder  . Umbilical hernia repair  2007    [place]  . Knee arthroscopy  10/2007    left  . Toenail excision      removed x2-bilateral  . Eye surgery  12/22/2010    tear duct probing-[place]  . Foreign body removal  03/29/2011    Procedure: REMOVAL FOREIGN BODY EXTREMITY;  Surgeon: Arther Abbott, MD;  Location: [facility];  Service: Orthopedics;  Laterality: Right;  Removal Foreign Body Right Thumb  . Quadriceps tendon repair   07/21/2011    Procedure: REPAIR QUADRICEP TENDON;  Surgeon: Arther Abbott, MD;  Location: [facility];  Service: Orthopedics;  Laterality: Right;  . Nm myoview ltd    . Doppler echocardiography    . Left heart catheterization with coronary angiogram N/A 03/28/2013    Procedure: LEFT HEART CATHETERIZATION WITH CORONARY ANGIOGRAM;  Surgeon: Leonie Man, MD;  Location: [facility] CATH LAB;  Service: Cardiovascular;  Laterality: N/A;  . Colonoscopy  10/2008    Fields: Rectal polyp obliterated, not retrieved, hemorrhoids, single ascending colon diverticulum near the CV. Next colonoscopy April 2020  . Colonoscopy N/A 12/25/2014    SLF: 1. Colorectal polyps (2) removed 2. Small internal hemorrhoids 3. the left colon is severely redundant  . Esophagogastroduodenoscopy N/A 12/25/2014    SLF: 1. Anemia most likely due to CRI, gastritis, gastric polyps 2. Moderate non-erosive gastriits and mild duodenitis.  3.TWo large gstric polyps removed.   Marland Kitchen Penile prosthesis implant N/A 08/16/2015    Procedure: PENILE PROTHESIS INFLATABLE, three piece, Excisional biopsy of Penile ulcer, Penile molding;  Surgeon: Carolan Clines, MD;  Location: [facility];  Service: Urology;  Laterality:  N/A;  . Knee arthroscopy with lateral menisectomy Right 10/14/2015    Procedure: LEFT KNEE ARTHROSCOPY WITH PARTIAL LATERAL MENISECTOMY;  Surgeon: Carole Civil, MD;  Location: [facility];  Service: Orthopedics;  Laterality: Right;    Allergies  Allergies  Allergen Reactions  . Opana [Oxymorphone Hcl] Itching  . Tramadol Itching    History of Present Illness     65 year old male with the above complex past medical history. He has a relatively long history of diabetes , as well as hypertension and hyperlipidemia. He underwent stress testing in 2014 that showed inferoapical ischemia and subsequently underwent diagnostic catheterization revealing  An aneurysmal area of the LAD without significant disease. He was treated medically and placed on  Plavix at the time. He was last seen in clinic in October 2016 , at which time he was doing well. In March of this year, he underwent right total knee arthroplasty. He did well perioperatively and did not suffer any cardiac complications. He is now partaking in rehabilitation and notes that it is hard and sometimes painful. That said, he has done well from a cardiac standpoint without any chest pain or dyspnea. Further, he denies PND, orthopnea, dizziness, syncope, edema, or early satiety.  Home Medications    Prior to Admission medications   Medication Sig Start Date End Date Taking? Authorizing Provider  albuterol (PROVENTIL HFA) 108 (90 BASE) MCG/ACT inhaler Inhale 2 puffs into the lungs every 4 (four) hours as needed for wheezing.    Yes Historical Provider, MD  amLODipine (NORVASC) 10 MG tablet Take 10 mg by mouth daily.    Yes Historical Provider, MD  budesonide-formoterol (SYMBICORT) 160-4.5 MCG/ACT inhaler Inhale 2 puffs into the lungs daily as needed (Shortness of Breath).    Yes Historical Provider, MD  calcitRIOL (ROCALTROL) 0.25 MCG capsule Take 0.25 mcg by mouth daily.   Yes Historical Provider, MD  cetirizine (ZYRTEC) 10 MG tablet Take 10 mg by mouth daily as needed for allergies.    Yes Historical Provider, MD  Insulin Glargine (LANTUS SOLOSTAR) 100 UNIT/ML Solostar Pen Inject 50 Units into the skin at bedtime. 08/25/15  Yes Cassandria Anger, MD  iron polysaccharides (NIFEREX) 150 MG capsule Take 150 mg by mouth daily.   Yes Historical Provider, MD  mometasone (NASONEX) 50 MCG/ACT nasal spray Place 2 sprays into the nose daily as needed (allergies).    Yes Historical Provider, MD  oxyCODONE-acetaminophen (ROXICET) 5-325 MG tablet Take 1 tablet by mouth every 6 (six) hours as needed for severe pain. 10/22/15  Yes Carole Civil, MD  pantoprazole (PROTONIX) 40 MG tablet Take 40 mg by mouth daily.   Yes Historical Provider, MD  simvastatin (ZOCOR) 20 MG tablet Take 20 mg by mouth  every morning.    Yes Historical Provider, MD  TRADJENTA 5 MG TABS tablet TAKE 1 TABLET EVERY DAY 10/06/15  Yes Cassandria Anger, MD    Review of Systems     as above, he is status post right total knee arthroplasty and is recovering well from this. He has had pain associated with this recovery but overall is healing well. He denies chest pain, palpitations, dyspnea, PND, orthopnea, dizziness, syncope, edema, or early satiety.  All other systems reviewed and are otherwise negative except as noted above.  Physical Exam    VS:  BP 164/107 mmHg  Pulse 72  Ht 5\' 4"  (1.626 m)  Wt 244 lb 9.6 oz (110.95 kg)  BMI 41.96 kg/m2 , BMI Body mass index  is 41.96 kg/(m^2). GEN: Well nourished, well developed, in no acute distress. HEENT: normal. Neck: Supple, no JVD, carotid bruits, or masses. Cardiac: RRR, no murmurs, rubs, or gallops. No clubbing, cyanosis,  Trace bilateral lower extremity edema.  Radials/DP/PT 2+ and equal bilaterally.  Respiratory:  Respirations regular and unlabored, clear to auscultation bilaterally. GI: Soft, nontender, nondistended, BS + x 4. MS: no deformity or atrophy. Well-healing scar over the right knee. Skin: warm and dry, no rash. Neuro:  Strength and sensation are intact. Psych: Normal affect.  Accessory Clinical Findings   none   Assessment & Plan    1.   Coronary artery disease: status post prior catheterization in September 2014 revealing a small aneurysmal segment of the LAD , for which he was treated with Plavix therapy. He is no longer on Plavix. I have recommended that he start aspirin 81 mg daily. He otherwise remains on statin therapy  And is doing well without chest pain or dyspnea.   2.  Essential hypertension: blood pressure was elevated today at 164/107. I repeated this and it was 160/100. In discussing this with the patient, he says he periodically checks his blood pressure at home and it is not usually this high though he cannot give me specifics.  I suspect that he trends higher than he thinks. As a compromise, he will check his blood pressures daily for the next week and call us in a week with those results. He is currently on amlodipine 10 mg daily. His chronic kidney disease limits our ability to use diuretics, ace inhibitors, and ARB's. While a history of COPD and wheezing likely make beta blockers a poor choice as well. If his blood pressures are trending up over the next week, I would recommend that we start hydralazine 25 mg twice a day.   3. Hyperlipidemia: he is on simvastatin therapy and had an LDL of 72 and October 2016.   4. Osteoarthritis of the right knee: Status post right total knee arthroplasty: This appears to be healing well and he is participating regularly in rehabilitation and doing well.   5. Morbid obesity: patient remains overweight and would benefit from  Increased exercise. Currently, her activity is somewhat limited by right knee pain related to his surgery. Hopefully he will remain active going forward.   6. COPD: No active wheezing. As per primary care and he is on inhaler therapy.  7. Type 2 diabetes mellitus: he is on Lantus and this is followed by primary care.  8. Stage IV chronic kidney disease: he follows with  Nephrology.   9. Disposition: he will contact us next week with his blood pressures. Otherwise, follow-up in 6 months or sooner if necessary.   Murray Hodgkins, NP 11/02/2015, 1:18 PM

## 2015-11-03 ENCOUNTER — Ambulatory Visit (HOSPITAL_COMMUNITY): Payer: Medicare Other | Admitting: Physical Therapy

## 2015-11-03 DIAGNOSIS — M25561 Pain in right knee: Secondary | ICD-10-CM | POA: Diagnosis not present

## 2015-11-03 DIAGNOSIS — R600 Localized edema: Secondary | ICD-10-CM | POA: Diagnosis not present

## 2015-11-03 DIAGNOSIS — R262 Difficulty in walking, not elsewhere classified: Secondary | ICD-10-CM | POA: Diagnosis not present

## 2015-11-03 DIAGNOSIS — M25661 Stiffness of right knee, not elsewhere classified: Secondary | ICD-10-CM | POA: Diagnosis not present

## 2015-11-03 DIAGNOSIS — M6281 Muscle weakness (generalized): Secondary | ICD-10-CM | POA: Diagnosis not present

## 2015-11-03 NOTE — Therapy (Addendum)
[place], [place], [address] Phone: [phone]   Fax:  [phone]  Physical Therapy Treatment  Patient Details  Name: [name] [name] MRN: [id number] Date of Birth: 08/20/1950 Referring Provider: Arther Abbott   Encounter Date: 11/03/2015      PT End of Session - 11/03/15 0905    Visit Number 3   Number of Visits 12   Date for PT Re-Evaluation 11/22/15   Authorization Type Medicare    Authorization Time Period 10/25/15 to 11/24/15   Authorization - Visit Number 3   Authorization - Number of Visits 10   PT Start Time 0818   PT Stop Time 0906   PT Time Calculation (min) 48 min   Activity Tolerance Patient tolerated treatment well   Behavior During Therapy [facility] for tasks assessed/performed      Past Medical History  Diagnosis Date  . COPD (chronic obstructive pulmonary disease) ([place])   . Sinusitis   . PUD (peptic ulcer disease)     remote, reports f/u EGD about 8 years ago unremarkable   . GERD (gastroesophageal reflux disease)   . Hyperlipidemia   . Essential hypertension   . Post traumatic stress disorder   . Obesity     Truncal  . CAD (coronary artery disease)     a. 2014 MV: abnl w/ infap ischemia; b. 03/2013 Cath: aneurysmal bleb in the LAD w/ otw nonobs dzs-->Med Rx.  . Hepatomegaly     noted on noncontrast CT 2015  . Vitamin D deficiency   . Chronic back pain   . Chronic shoulder pain   . Chronic knee pain     a. 09/2015 s/p R TKA.  Marland Kitchen Chronic pain   . Obstructive sleep apnea     cpap - settings at 4   . Arthritis   . On home oxygen therapy     uses 2l when is going somewhere per patient   . CKD (chronic kidney disease), stage IV ([place])     stage 4 per office visit note of Dr Lowanda Foster on 05/2015   . Chronic sinusitis   . Reactive airway disease     related to exposure to chemical during 9/11  . Bell palsy     Past Surgical History  Procedure Laterality Date  . Asad lt shoulder   12/2008    left shoulder  . Umbilical hernia repair  2007    [place]  . Knee arthroscopy  10/2007    left  . Toenail excision      removed x2-bilateral  . Eye surgery  12/22/2010    tear duct probing-[place]  . Foreign body removal  03/29/2011    Procedure: REMOVAL FOREIGN BODY EXTREMITY;  Surgeon: Arther Abbott, MD;  Location: [facility];  Service: Orthopedics;  Laterality: Right;  Removal Foreign Body Right Thumb  . Quadriceps tendon repair  07/21/2011    Procedure: REPAIR QUADRICEP TENDON;  Surgeon: Arther Abbott, MD;  Location: [facility];  Service: Orthopedics;  Laterality: Right;  . Nm myoview ltd    . Doppler echocardiography    . Left heart catheterization with coronary angiogram N/A 03/28/2013    Procedure: LEFT HEART CATHETERIZATION WITH CORONARY ANGIOGRAM;  Surgeon: Leonie Man, MD;  Location: [facility] CATH LAB;  Service: Cardiovascular;  Laterality: N/A;  . Colonoscopy  10/2008    Fields: Rectal polyp obliterated, not retrieved, hemorrhoids, single ascending colon diverticulum near the CV. Next colonoscopy April 2020  . Colonoscopy N/A  12/25/2014    SLF: 1. Colorectal polyps (2) removed 2. Small internal hemorrhoids 3. the left colon is severely redundant  . Esophagogastroduodenoscopy N/A 12/25/2014    SLF: 1. Anemia most likely due to CRI, gastritis, gastric polyps 2. Moderate non-erosive gastriits and mild duodenitis.  3.TWo large gstric polyps removed.   Marland Kitchen Penile prosthesis implant N/A 08/16/2015    Procedure: PENILE PROTHESIS INFLATABLE, three piece, Excisional biopsy of Penile ulcer, Penile molding;  Surgeon: Carolan Clines, MD;  Location: [facility];  Service: Urology;  Laterality: N/A;  . Knee arthroscopy with lateral menisectomy Right 10/14/2015    Procedure: LEFT KNEE ARTHROSCOPY WITH PARTIAL LATERAL MENISECTOMY;  Surgeon: Carole Civil, MD;  Location: [facility];  Service: Orthopedics;  Laterality: Right;    There were no vitals filed for this visit.      Subjective  Assessment - 11/03/15 0904    Subjective Pt states he is hurting 4/10 currently in his Rt knee.  Reports complaince with HEP.   Currently in Pain? Yes   Pain Score 4    Pain Location Knee   Pain Orientation Right   Pain Descriptors / Indicators Dull;Constant                         [facility] Adult PT Treatment/Exercise - 11/03/15 0827    Knee/Hip Exercises: Stretches   Quad Stretch 3 reps;30 seconds   Quad Stretch Limitations prone with rope   Knee/Hip Exercises: Aerobic   Stationary Bike 5' for knee mobilty seat 10  not included in billing   Knee/Hip Exercises: Standing   Forward Lunges Both;10 reps   Forward Lunges Limitations 4"   Functional Squat 10 reps   Rocker Board 2 minutes   Rocker Board Limitations R/L and A/P   SLS Rt 55", Lt 40"   Knee/Hip Exercises: Seated   Long Arc Quad Both;15 reps   Long Arc Quad Weight 3 lbs.   Knee/Hip Exercises: Supine   Straight Leg Raises 15 reps   Straight Leg Raises Limitations 3#   Knee/Hip Exercises: Prone   Hip Extension Both;15 reps                  PT Short Term Goals - 10/29/15 0913    PT SHORT TERM GOAL #1   Title Patient will demosntrate R knee ROM of 0-120 degrees in order to assist in improving overall mobility and gait, reduce overall pain    Baseline 10/29/2015: AROM 0-124 degrees   Time 2   Period Weeks   Status Achieved   PT SHORT TERM GOAL #2   Title Patient to demonstrate improved gait mechanics, including even step length/stance times, consistent heel-toe pattern, no unsteadiness, in order to improve overall mobilty    Status On-going   PT SHORT TERM GOAL #3   Title Patient to report no more than 3/10 pain with all functional tasks and activities in R knee in order to assist in returning to PLOF, including re-achieving a full night's sleep on a consistent basis    Status On-going   PT SHORT TERM GOAL #4   Title Patient to be independent in appropriate HEP, to be updated PRN    Status  On-going           PT Long Term Goals - 10/25/15 1615    PT LONG TERM GOAL #1   Title Patient to demonstrate strength 5/5 in all tested muscle groups in order to enhance regional stability,  reduce pain, and assist in returning to PLOF    Time 4   Period Weeks   Status New   PT LONG TERM GOAL #2   Title Patient to experience no more than 2/10 in R knee during all functional activities and physical tasks in order to assist in returning to PLOF with minimal discomfort    Time 4   Period Weeks   Status New   PT LONG TERM GOAL #3   Title Patient will be able to reciporcally ascend/descend full flight of stairs with reciprocal pattern, good eccentric control, and minimal unsteadiness  in order to assist in improving mobility in community and at home    Time 4   Period Weeks   Status New   PT LONG TERM GOAL #4   Title Patient to report he has been able to make a full return to PLOF, including regular gardening, with R knee pain no more than 2/10 in order to return to all desired and required activities and improve QOL    Time 4   Period Weeks   Status New               Plan - 11/03/15 [id number]    Clinical Impression Statement Continued therapy with focus on reducing pain and improving strength of Rt LE.  Warm up on bike prior to further therex (not included in billable time).  Progressed with additon of forward lunges and squats.  Audible pop with initial squat in Rt knee but able to complete remainder in improved form and without crepitus.  Increased to 3# weight for LAQ and SLR as well without pain.  Pt without pain behaviors or complaints throughout session.  Instruced to continue icing and elevation to decrease swelling and pain.l   Rehab Potential Good   PT Frequency 3x / week   PT Duration 4 weeks   PT Treatment/Interventions ADLs/Self Care Home Management;Biofeedback;Cryotherapy;Gait training;Stair training;Functional mobility training;Therapeutic activities;Therapeutic  exercise;Balance training;Neuromuscular re-education;Patient/family education;Manual techniques;Scar mobilization;Passive range of motion;Energy conservation;Taping   PT Next Visit Plan Continue to progress closed chain activities as ROM is WNL. continue functional stretches and strenghtening, gait training, manual for pain and edema control PRN.      Patient will benefit from skilled therapeutic intervention in order to improve the following deficits and impairments:  Abnormal gait, Decreased skin integrity, Hypomobility, Decreased strength, Pain, Difficulty walking, Improper body mechanics, Decreased coordination, Impaired flexibility  Visit Diagnosis: Stiffness of right knee, not elsewhere classified  Pain in right knee  Localized edema  Muscle weakness (generalized)  Difficulty in walking, not elsewhere classified     Problem List Patient Active Problem List   Diagnosis Date Noted  . CAD (coronary artery disease)   . Lateral meniscus tear   . Erectile dysfunction 08/16/2015  . Hemorrhoids 07/05/2015  . Vitamin D deficiency 06/04/2015  . OSA on CPAP 05/14/2015  . Anemia 03/24/2015  . Hepatomegaly 12/01/2014  . Abnormal CT of liver 12/01/2014  . IDA (iron deficiency anemia) 12/01/2014  . Abnormal EKG 05/29/2014  . Chronic low back pain 06/16/2013  . Arthritis pain of hand 06/10/2013  . Back pain 06/05/2013  . Abnormal nuclear stress test 03/28/2013  . Muscle weakness (generalized) 05/20/2012  . Partial nontraumatic rupture of rotator cuff 05/15/2012  . Shoulder arthritis 05/15/2012  . Rotator cuff tear, right 04/25/2012  . Knee pain 11/29/2011  . Difficulty in walking(719.7) 09/04/2011  . Stiffness of joint, not elsewhere classified, lower leg 09/04/2011  . Rupture quadriceps tendon  08/03/2011  . Quadriceps tendon rupture 07/20/2011  . Foreign body of thumb, right 03/28/2011  . OA (osteoarthritis) of knee 03/15/2011  . Chronic pain 03/15/2011  . CTS (carpal tunnel  syndrome) 03/15/2011  . KNEE, ARTHRITIS, DEGEN./OSTEO 09/09/2009  . DERANGEMENT MENISCUS 08/19/2009  . SHOULDER, ARTHRITIS, DEGEN./OSTEO 04/05/2009  . RUPTURE ROTATOR CUFF 12/28/2008  . BACK PAIN 10/21/2008  . Type 2 diabetes mellitus with stage 4 chronic kidney disease ([place]) 09/29/2008  . Hyperlipidemia 09/29/2008  . Essential hypertension 09/29/2008  . GERD 09/29/2008  . HEMATOCHEZIA 09/29/2008  . TENDINITIS, CALCIFIC, SHOULDER, LEFT 08/06/2008  . SHOULDER PAIN 06/29/2008  . IMPINGEMENT SYNDROME 06/29/2008    Teena Irani, PTA/CLT [phone]  11/03/2015, 1:08 PM  [place] [address] [place], [place], [address] Phone: [phone]   Fax:  [phone]  Name: SHAUNTEZ CONSTANTE MRN: [id number] Date of Birth: [birth year]

## 2015-11-08 ENCOUNTER — Telehealth: Payer: Self-pay | Admitting: Internal Medicine

## 2015-11-08 MED ORDER — HYDRALAZINE HCL 10 MG PO TABS: 10.0000 mg | ORAL_TABLET | Freq: Three times a day (TID) | ORAL | Status: DC

## 2015-11-08 NOTE — Telephone Encounter (Signed)
Patient saw [name] Ran, NP on 4/18 for routine 6 month visit and was advised to monitor home BPs. Reported below. Routed to [place], NP & Dr. Debara Pickett, MD to review

## 2015-11-08 NOTE — Telephone Encounter (Signed)
Patient notified of info per Angelica Ran, NP. He voiced understanding and agreed with plan. Rx(s) sent to pharmacy electronically.

## 2015-11-08 NOTE — Telephone Encounter (Signed)
New message  Pt c/o BP issue:  1. What are your last 5 BP readings?   4/19 146/97 4/20 122/83 4/21 142/88 4/22 148/91 4/23 138/82 4/24 144/98  2. Are you having any other symptoms (ex. Dizziness, headache, blurred vision, passed out)? No.

## 2015-11-08 NOTE — Telephone Encounter (Signed)
Thanks .. I agree.  Dr. H 

## 2015-11-08 NOTE — Telephone Encounter (Signed)
His BP's are mostly above the goal for a diabetic (<140/90), though not by much.   His history of CKD IV makes him a poor candidate for ACEI/ARB or thiazide diuretics, while his h/o COPD on inhalers make  blockers less than ideal.  Please have him start hydralazine 10 mg PO TID, #90, 3 refills and advise him to continue to check his BP's once daily.  He should call us if BP's continue to run over [id number] systolic so that we may titrate the hydralazine.

## 2015-11-09 ENCOUNTER — Ambulatory Visit (HOSPITAL_COMMUNITY): Payer: Medicare Other | Admitting: Physical Therapy

## 2015-11-09 DIAGNOSIS — M25661 Stiffness of right knee, not elsewhere classified: Secondary | ICD-10-CM | POA: Diagnosis not present

## 2015-11-09 DIAGNOSIS — M25561 Pain in right knee: Secondary | ICD-10-CM

## 2015-11-09 DIAGNOSIS — M6281 Muscle weakness (generalized): Secondary | ICD-10-CM

## 2015-11-09 DIAGNOSIS — R600 Localized edema: Secondary | ICD-10-CM

## 2015-11-09 DIAGNOSIS — R262 Difficulty in walking, not elsewhere classified: Secondary | ICD-10-CM

## 2015-11-09 NOTE — Therapy (Signed)
[place], [place], [address] Phone: [phone]   Fax:  [phone]  Physical Therapy Treatment  Patient Details  Name: [name] [name] MRN: [id number] Date of Birth: 1951-05-12 Referring Provider: Arther Abbott   Encounter Date: 11/09/2015      PT End of Session - 11/09/15 0902    Visit Number 4   Number of Visits 12   Date for PT Re-Evaluation 11/22/15   Authorization Type Medicare    Authorization Time Period 10/25/15 to 11/24/15   Authorization - Visit Number 4   Authorization - Number of Visits 10   PT Start Time 0817   PT Stop Time 0858   PT Time Calculation (min) 41 min   Activity Tolerance Patient tolerated treatment well   Behavior During Therapy [facility] for tasks assessed/performed      Past Medical History  Diagnosis Date  . COPD (chronic obstructive pulmonary disease) ([place])   . Sinusitis   . PUD (peptic ulcer disease)     remote, reports f/u EGD about 8 years ago unremarkable   . GERD (gastroesophageal reflux disease)   . Hyperlipidemia   . Essential hypertension   . Post traumatic stress disorder   . Obesity     Truncal  . CAD (coronary artery disease)     a. 2014 MV: abnl w/ infap ischemia; b. 03/2013 Cath: aneurysmal bleb in the LAD w/ otw nonobs dzs-->Med Rx.  . Hepatomegaly     noted on noncontrast CT 2015  . Vitamin D deficiency   . Chronic back pain   . Chronic shoulder pain   . Chronic knee pain     a. 09/2015 s/p R TKA.  Marland Kitchen Chronic pain   . Obstructive sleep apnea     cpap - settings at 4   . Arthritis   . On home oxygen therapy     uses 2l when is going somewhere per patient   . CKD (chronic kidney disease), stage IV ([place])     stage 4 per office visit note of Dr Lowanda Foster on 05/2015   . Chronic sinusitis   . Reactive airway disease     related to exposure to chemical during 9/11  . Bell palsy     Past Surgical History  Procedure Laterality Date  . Asad lt shoulder   12/2008    left shoulder  . Umbilical hernia repair  2007    [place]  . Knee arthroscopy  10/2007    left  . Toenail excision      removed x2-bilateral  . Eye surgery  12/22/2010    tear duct probing-[place]  . Foreign body removal  03/29/2011    Procedure: REMOVAL FOREIGN BODY EXTREMITY;  Surgeon: Arther Abbott, MD;  Location: [facility];  Service: Orthopedics;  Laterality: Right;  Removal Foreign Body Right Thumb  . Quadriceps tendon repair  07/21/2011    Procedure: REPAIR QUADRICEP TENDON;  Surgeon: Arther Abbott, MD;  Location: [facility];  Service: Orthopedics;  Laterality: Right;  . Nm myoview ltd    . Doppler echocardiography    . Left heart catheterization with coronary angiogram N/A 03/28/2013    Procedure: LEFT HEART CATHETERIZATION WITH CORONARY ANGIOGRAM;  Surgeon: Leonie Man, MD;  Location: [facility] CATH LAB;  Service: Cardiovascular;  Laterality: N/A;  . Colonoscopy  10/2008    Fields: Rectal polyp obliterated, not retrieved, hemorrhoids, single ascending colon diverticulum near the CV. Next colonoscopy April 2020  . Colonoscopy N/A  12/25/2014    SLF: 1. Colorectal polyps (2) removed 2. Small internal hemorrhoids 3. the left colon is severely redundant  . Esophagogastroduodenoscopy N/A 12/25/2014    SLF: 1. Anemia most likely due to CRI, gastritis, gastric polyps 2. Moderate non-erosive gastriits and mild duodenitis.  3.TWo large gstric polyps removed.   Marland Kitchen Penile prosthesis implant N/A 08/16/2015    Procedure: PENILE PROTHESIS INFLATABLE, three piece, Excisional biopsy of Penile ulcer, Penile molding;  Surgeon: Carolan Clines, MD;  Location: [facility];  Service: Urology;  Laterality: N/A;  . Knee arthroscopy with lateral menisectomy Right 10/14/2015    Procedure: LEFT KNEE ARTHROSCOPY WITH PARTIAL LATERAL MENISECTOMY;  Surgeon: Carole Civil, MD;  Location: [facility];  Service: Orthopedics;  Laterality: Right;    There were no vitals filed for this visit.      Subjective  Assessment - 11/09/15 0820    Subjective States he is a little sore today. Rates it a 4.5/10 in his Rt knee. Also notes his Lt knee is hurting some today. He has been performing his HEP.    Pertinent History OSA, DM with CKD stage 4, LBP, GERD, SOB with dyspnea, PTSD    Currently in Pain? Yes   Pain Score --  4.5   Pain Location Knee   Pain Orientation Right   Pain Descriptors / Indicators Dull   Pain Type Surgical pain   Pain Onset 1 to 4 weeks ago   Pain Frequency Constant   Aggravating Factors  standing, walking, sitting still   Pain Relieving Factors pain meds and sleep   Effect of Pain on Daily Activities PLOF activity                         [facility] Adult PT Treatment/Exercise - 11/09/15 0001    Knee/Hip Exercises: Stretches   Hip Flexor Stretch Both;2 reps;30 seconds   Knee/Hip Exercises: Aerobic   Stationary Bike 5' for knee mobilty seat 10   Knee/Hip Exercises: Standing   Wall Squat 2 sets;10 reps  minor Lt weight shift noted   Knee/Hip Exercises: Supine   Bridges with Clamshell Both;2 sets;10 reps   Other Supine Knee/Hip Exercises bridge hold with alternating knee extension x5 reps  increased R quad pain noted   Knee/Hip Exercises: Prone   Hip Extension Right;2 sets;10 reps   Hip Extension Limitations 3#   Manual Therapy   Manual Therapy Soft tissue mobilization   Manual therapy comments performed separately from all other interventions   Soft tissue mobilization Rt distal quad x3 min                PT Education - 11/09/15 0901    Education provided Yes   Education Details discussed/updated HEP   Person(s) Educated Patient   Methods Explanation;Demonstration   Comprehension Verbalized understanding;Returned demonstration          PT Short Term Goals - 10/29/15 0913    PT SHORT TERM GOAL #1   Title Patient will demosntrate R knee ROM of 0-120 degrees in order to assist in improving overall mobility and gait, reduce overall pain     Baseline 10/29/2015: AROM 0-124 degrees   Time 2   Period Weeks   Status Achieved   PT SHORT TERM GOAL #2   Title Patient to demonstrate improved gait mechanics, including even step length/stance times, consistent heel-toe pattern, no unsteadiness, in order to improve overall mobilty    Status On-going   PT SHORT TERM  GOAL #3   Title Patient to report no more than 3/10 pain with all functional tasks and activities in R knee in order to assist in returning to PLOF, including re-achieving a full night's sleep on a consistent basis    Status On-going   PT SHORT TERM GOAL #4   Title Patient to be independent in appropriate HEP, to be updated PRN    Status On-going           PT Long Term Goals - 10/25/15 [place] #1   Title Patient to demonstrate strength 5/5 in all tested muscle groups in order to enhance regional stability, reduce pain, and assist in returning to PLOF    Time 4   Period Weeks   Status New   PT LONG TERM GOAL #2   Title Patient to experience no more than 2/10 in R knee during all functional activities and physical tasks in order to assist in returning to PLOF with minimal discomfort    Time 4   Period Weeks   Status New   PT LONG TERM GOAL #3   Title Patient will be able to reciporcally ascend/descend full flight of stairs with reciprocal pattern, good eccentric control, and minimal unsteadiness  in order to assist in improving mobility in community and at home    Time 4   Period Weeks   Status New   PT LONG TERM GOAL #4   Title Patient to report he has been able to make a full return to PLOF, including regular gardening, with R knee pain no more than 2/10 in order to return to all desired and required activities and improve QOL    Time 4   Period Weeks   Status New               Plan - 11/09/15 0902    Clinical Impression Statement Today's session focused on LE therex to improve LE mobility and strength. Pt demonstrating minimal weight  shift during wall squat activity which improved with visual cuing from the therapist. Will    Rehab Potential Good   PT Frequency 3x / week   PT Duration 4 weeks   PT Treatment/Interventions ADLs/Self Care Home Management;Biofeedback;Cryotherapy;Gait training;Stair training;Functional mobility training;Therapeutic activities;Therapeutic exercise;Balance training;Neuromuscular re-education;Patient/family education;Manual techniques;Scar mobilization;Passive range of motion;Energy conservation;Taping   PT Next Visit Plan Continue to progress closed chain activities as ROM is WNL. continue functional stretches and strenghtening, gait training, manual for pain and edema control PRN.   PT Home Exercise Plan updated with knee extension using green TB   Consulted and Agree with Plan of Care Patient      Patient will benefit from skilled therapeutic intervention in order to improve the following deficits and impairments:  Abnormal gait, Decreased skin integrity, Hypomobility, Decreased strength, Pain, Difficulty walking, Improper body mechanics, Decreased coordination, Impaired flexibility  Visit Diagnosis: Stiffness of right knee, not elsewhere classified  Pain in right knee  Localized edema  Muscle weakness (generalized)  Difficulty in walking, not elsewhere classified     Problem List Patient Active Problem List   Diagnosis Date Noted  . CAD (coronary artery disease)   . Lateral meniscus tear   . Erectile dysfunction 08/16/2015  . Hemorrhoids 07/05/2015  . Vitamin D deficiency 06/04/2015  . OSA on CPAP 05/14/2015  . Anemia 03/24/2015  . Hepatomegaly 12/01/2014  . Abnormal CT of liver 12/01/2014  . IDA (iron deficiency anemia) 12/01/2014  . Abnormal EKG 05/29/2014  .  Chronic low back pain 06/16/2013  . Arthritis pain of hand 06/10/2013  . Back pain 06/05/2013  . Abnormal nuclear stress test 03/28/2013  . Muscle weakness (generalized) 05/20/2012  . Partial nontraumatic rupture  of rotator cuff 05/15/2012  . Shoulder arthritis 05/15/2012  . Rotator cuff tear, right 04/25/2012  . Knee pain 11/29/2011  . Difficulty in walking(719.7) 09/04/2011  . Stiffness of joint, not elsewhere classified, lower leg 09/04/2011  . Rupture quadriceps tendon 08/03/2011  . Quadriceps tendon rupture 07/20/2011  . Foreign body of thumb, right 03/28/2011  . OA (osteoarthritis) of knee 03/15/2011  . Chronic pain 03/15/2011  . CTS (carpal tunnel syndrome) 03/15/2011  . KNEE, ARTHRITIS, DEGEN./OSTEO 09/09/2009  . DERANGEMENT MENISCUS 08/19/2009  . SHOULDER, ARTHRITIS, DEGEN./OSTEO 04/05/2009  . RUPTURE ROTATOR CUFF 12/28/2008  . BACK PAIN 10/21/2008  . Type 2 diabetes mellitus with stage 4 chronic kidney disease ([place]) 09/29/2008  . Hyperlipidemia 09/29/2008  . Essential hypertension 09/29/2008  . GERD 09/29/2008  . HEMATOCHEZIA 09/29/2008  . TENDINITIS, CALCIFIC, SHOULDER, LEFT 08/06/2008  . SHOULDER PAIN 06/29/2008  . IMPINGEMENT SYNDROME 06/29/2008   9:11 AM,11/09/2015 Elly Modena PT, DPT [facility] [address] [place], [place], [address] Phone: [phone]   Fax:  [phone]  Name: [name] [name] MRN: [id number] Date of Birth: 02-18-1951

## 2015-11-10 ENCOUNTER — Ambulatory Visit (HOSPITAL_COMMUNITY): Payer: Medicare Other

## 2015-11-10 DIAGNOSIS — M25561 Pain in right knee: Secondary | ICD-10-CM | POA: Diagnosis not present

## 2015-11-10 DIAGNOSIS — R262 Difficulty in walking, not elsewhere classified: Secondary | ICD-10-CM | POA: Diagnosis not present

## 2015-11-10 DIAGNOSIS — M6281 Muscle weakness (generalized): Secondary | ICD-10-CM

## 2015-11-10 DIAGNOSIS — M25661 Stiffness of right knee, not elsewhere classified: Secondary | ICD-10-CM

## 2015-11-10 DIAGNOSIS — R600 Localized edema: Secondary | ICD-10-CM

## 2015-11-10 NOTE — Therapy (Signed)
[place], [place], [address] Phone: [phone]   Fax:  [phone]  Physical Therapy Treatment  Patient Details  Name: [name] [name] MRN: [id number] Date of Birth: December 30, 1950 Referring Provider: Arther Abbott  Encounter Date: 11/10/2015      PT End of Session - 11/10/15 0823    Visit Number 5   Number of Visits 12   Date for PT Re-Evaluation 11/22/15   Authorization Type Medicare    Authorization Time Period 10/25/15 to 11/24/15   Authorization - Visit Number 5   Authorization - Number of Visits 10   PT Start Time 0817   PT Stop Time 0900   PT Time Calculation (min) 43 min   Activity Tolerance Patient tolerated treatment well   Behavior During Therapy [facility] for tasks assessed/performed      Past Medical History  Diagnosis Date  . COPD (chronic obstructive pulmonary disease) ([place])   . Sinusitis   . PUD (peptic ulcer disease)     remote, reports f/u EGD about 8 years ago unremarkable   . GERD (gastroesophageal reflux disease)   . Hyperlipidemia   . Essential hypertension   . Post traumatic stress disorder   . Obesity     Truncal  . CAD (coronary artery disease)     a. 2014 MV: abnl w/ infap ischemia; b. 03/2013 Cath: aneurysmal bleb in the LAD w/ otw nonobs dzs-->Med Rx.  . Hepatomegaly     noted on noncontrast CT 2015  . Vitamin D deficiency   . Chronic back pain   . Chronic shoulder pain   . Chronic knee pain     a. 09/2015 s/p R TKA.  Marland Kitchen Chronic pain   . Obstructive sleep apnea     cpap - settings at 4   . Arthritis   . On home oxygen therapy     uses 2l when is going somewhere per patient   . CKD (chronic kidney disease), stage IV ([place])     stage 4 per office visit note of Dr Lowanda Foster on 05/2015   . Chronic sinusitis   . Reactive airway disease     related to exposure to chemical during 9/11  . Bell palsy     Past Surgical History  Procedure Laterality Date  . Asad lt shoulder   12/2008    left shoulder  . Umbilical hernia repair  2007    [place]  . Knee arthroscopy  10/2007    left  . Toenail excision      removed x2-bilateral  . Eye surgery  12/22/2010    tear duct probing-[place]  . Foreign body removal  03/29/2011    Procedure: REMOVAL FOREIGN BODY EXTREMITY;  Surgeon: Arther Abbott, MD;  Location: [facility];  Service: Orthopedics;  Laterality: Right;  Removal Foreign Body Right Thumb  . Quadriceps tendon repair  07/21/2011    Procedure: REPAIR QUADRICEP TENDON;  Surgeon: Arther Abbott, MD;  Location: [facility];  Service: Orthopedics;  Laterality: Right;  . Nm myoview ltd    . Doppler echocardiography    . Left heart catheterization with coronary angiogram N/A 03/28/2013    Procedure: LEFT HEART CATHETERIZATION WITH CORONARY ANGIOGRAM;  Surgeon: Leonie Man, MD;  Location: [facility] CATH LAB;  Service: Cardiovascular;  Laterality: N/A;  . Colonoscopy  10/2008    Fields: Rectal polyp obliterated, not retrieved, hemorrhoids, single ascending colon diverticulum near the CV. Next colonoscopy April 2020  . Colonoscopy N/A 12/25/2014  SLF: 1. Colorectal polyps (2) removed 2. Small internal hemorrhoids 3. the left colon is severely redundant  . Esophagogastroduodenoscopy N/A 12/25/2014    SLF: 1. Anemia most likely due to CRI, gastritis, gastric polyps 2. Moderate non-erosive gastriits and mild duodenitis.  3.TWo large gstric polyps removed.   Marland Kitchen Penile prosthesis implant N/A 08/16/2015    Procedure: PENILE PROTHESIS INFLATABLE, three piece, Excisional biopsy of Penile ulcer, Penile molding;  Surgeon: Carolan Clines, MD;  Location: [facility];  Service: Urology;  Laterality: N/A;  . Knee arthroscopy with lateral menisectomy Right 10/14/2015    Procedure: LEFT KNEE ARTHROSCOPY WITH PARTIAL LATERAL MENISECTOMY;  Surgeon: Carole Civil, MD;  Location: [facility];  Service: Orthopedics;  Laterality: Right;    There were no vitals filed for this visit.      Subjective  Assessment - 11/10/15 0820    Subjective Pt stated knee is a little stiff this morning with a little ache to anterior knee.  Pain scale 2/10.  Reports compliance with HEP.   Pertinent History OSA, DM with CKD stage 4, LBP, GERD, SOB with dyspnea, PTSD    Patient Stated Goals get back to PLOF    Currently in Pain? Yes   Pain Score 2    Pain Location Knee   Pain Orientation Right   Pain Descriptors / Indicators Tightness;Sore;Aching  Stiffness   Pain Type Surgical pain   Pain Radiating Towards up and down leg- to mid thigh and to mid calf    Pain Onset 1 to 4 weeks ago   Pain Frequency Constant   Aggravating Factors  standing, walking, sitting still   Pain Relieving Factors pain meds and sleep   Effect of Pain on Daily Activities PLOF activity            [facility] PT Assessment - 11/10/15 0001    Assessment   Medical Diagnosis s/p R knee arthroscopy    Referring Provider Arther Abbott   Onset Date/Surgical Date 10/21/15   Next MD Visit 12/08/2015                     [facility] Adult PT Treatment/Exercise - 11/10/15 0001    Knee/Hip Exercises: Stretches   Hip Flexor Stretch 3 reps;30 seconds   Hip Flexor Stretch Limitations Thomas stretch supine with rope   Knee/Hip Exercises: Aerobic   Stationary Bike 5' for knee mobilty seat 10 (no charge)   Knee/Hip Exercises: Standing   Heel Raises 20 reps   Forward Lunges Both;15 reps   Forward Lunges Limitations 4in step   Lateral Step Up Right;10 reps;Hand Hold: 1;Step Height: 4"   Forward Step Up Right;10 reps;Hand Hold: 1;Step Height: 4"   Wall Squat 2 sets;10 reps   Rocker Board 2 minutes   Rocker Board Limitations R/L and A/P   SLS Rt 47" Lt 60" max of 3   Manual Therapy   Manual Therapy Soft tissue mobilization   Manual therapy comments performed separately from all other interventions   Soft tissue mobilization Rt distal quad x8 min                PT Education - 11/09/15 0901    Education provided Yes    Education Details discussed/updated HEP   Person(s) Educated Patient   Methods Explanation;Demonstration   Comprehension Verbalized understanding;Returned demonstration          PT Short Term Goals - 10/29/15 0913    PT SHORT TERM GOAL #1   Title Patient  will demosntrate R knee ROM of 0-120 degrees in order to assist in improving overall mobility and gait, reduce overall pain    Baseline 10/29/2015: AROM 0-124 degrees   Time 2   Period Weeks   Status Achieved   PT SHORT TERM GOAL #2   Title Patient to demonstrate improved gait mechanics, including even step length/stance times, consistent heel-toe pattern, no unsteadiness, in order to improve overall mobilty    Status On-going   PT SHORT TERM GOAL #3   Title Patient to report no more than 3/10 pain with all functional tasks and activities in R knee in order to assist in returning to PLOF, including re-achieving a full night's sleep on a consistent basis    Status On-going   PT SHORT TERM GOAL #4   Title Patient to be independent in appropriate HEP, to be updated PRN    Status On-going           PT Long Term Goals - 10/25/15 1615    PT LONG TERM GOAL #1   Title Patient to demonstrate strength 5/5 in all tested muscle groups in order to enhance regional stability, reduce pain, and assist in returning to PLOF    Time 4   Period Weeks   Status New   PT LONG TERM GOAL #2   Title Patient to experience no more than 2/10 in R knee during all functional activities and physical tasks in order to assist in returning to PLOF with minimal discomfort    Time 4   Period Weeks   Status New   PT LONG TERM GOAL #3   Title Patient will be able to reciporcally ascend/descend full flight of stairs with reciprocal pattern, good eccentric control, and minimal unsteadiness  in order to assist in improving mobility in community and at home    Time 4   Period Weeks   Status New   PT LONG TERM GOAL #4   Title Patient to report he has been able  to make a full return to PLOF, including regular gardening, with R knee pain no more than 2/10 in order to return to all desired and required activities and improve QOL    Time 4   Period Weeks   Status New               Plan - 11/10/15 [id number]    Clinical Impression Statement Session focus on functional strengthening and stretches to improve knee mobilty and reduce stiffness.  Progressed CKC activities for functional strengthening with lateral and forward step ups.  Pt demonstrated improved weight shifting with minimal cueing required with wall squats.  Manual soft tissue mobiilization complete for pain control on quadriceps with noted tension, able to reduce though unable to fully resolve.  End of session no reports of pain.     Rehab Potential Good   PT Frequency 3x / week   PT Duration 4 weeks   PT Treatment/Interventions ADLs/Self Care Home Management;Biofeedback;Cryotherapy;Gait training;Stair training;Functional mobility training;Therapeutic activities;Therapeutic exercise;Balance training;Neuromuscular re-education;Patient/family education;Manual techniques;Scar mobilization;Passive range of motion;Energy conservation;Taping   PT Next Visit Plan Continue to progress closed chain activities as ROM is WNL. continue functional stretches and strenghtening, gait training, manual for pain and edema control PRN.  Begin vector stance and increase step up height when able.        Patient will benefit from skilled therapeutic intervention in order to improve the following deficits and impairments:  Abnormal gait, Decreased skin integrity, Hypomobility, Decreased strength, Pain, Difficulty  walking, Improper body mechanics, Decreased coordination, Impaired flexibility  Visit Diagnosis: Stiffness of right knee, not elsewhere classified  Pain in right knee  Localized edema  Muscle weakness (generalized)  Difficulty in walking, not elsewhere classified     Problem List Patient Active  Problem List   Diagnosis Date Noted  . CAD (coronary artery disease)   . Lateral meniscus tear   . Erectile dysfunction 08/16/2015  . Hemorrhoids 07/05/2015  . Vitamin D deficiency 06/04/2015  . OSA on CPAP 05/14/2015  . Anemia 03/24/2015  . Hepatomegaly 12/01/2014  . Abnormal CT of liver 12/01/2014  . IDA (iron deficiency anemia) 12/01/2014  . Abnormal EKG 05/29/2014  . Chronic low back pain 06/16/2013  . Arthritis pain of hand 06/10/2013  . Back pain 06/05/2013  . Abnormal nuclear stress test 03/28/2013  . Muscle weakness (generalized) 05/20/2012  . Partial nontraumatic rupture of rotator cuff 05/15/2012  . Shoulder arthritis 05/15/2012  . Rotator cuff tear, right 04/25/2012  . Knee pain 11/29/2011  . Difficulty in walking(719.7) 09/04/2011  . Stiffness of joint, not elsewhere classified, lower leg 09/04/2011  . Rupture quadriceps tendon 08/03/2011  . Quadriceps tendon rupture 07/20/2011  . Foreign body of thumb, right 03/28/2011  . OA (osteoarthritis) of knee 03/15/2011  . Chronic pain 03/15/2011  . CTS (carpal tunnel syndrome) 03/15/2011  . KNEE, ARTHRITIS, DEGEN./OSTEO 09/09/2009  . DERANGEMENT MENISCUS 08/19/2009  . SHOULDER, ARTHRITIS, DEGEN./OSTEO 04/05/2009  . RUPTURE ROTATOR CUFF 12/28/2008  . BACK PAIN 10/21/2008  . Type 2 diabetes mellitus with stage 4 chronic kidney disease ([place]) 09/29/2008  . Hyperlipidemia 09/29/2008  . Essential hypertension 09/29/2008  . GERD 09/29/2008  . HEMATOCHEZIA 09/29/2008  . TENDINITIS, CALCIFIC, SHOULDER, LEFT 08/06/2008  . SHOULDER PAIN 06/29/2008  . IMPINGEMENT SYNDROME 06/29/2008   Ihor Austin, LPTA; [place]  [facility] 11/10/2015, [place], [place], [address] Phone: [phone]   Fax:  [phone]  Name: [name] [name] MRN: [id number] Date of Birth: [birth year]

## 2015-11-12 ENCOUNTER — Ambulatory Visit (HOSPITAL_COMMUNITY): Payer: Medicare Other | Admitting: Physical Therapy

## 2015-11-12 DIAGNOSIS — M25561 Pain in right knee: Secondary | ICD-10-CM | POA: Diagnosis not present

## 2015-11-12 DIAGNOSIS — M25661 Stiffness of right knee, not elsewhere classified: Secondary | ICD-10-CM

## 2015-11-12 DIAGNOSIS — M6281 Muscle weakness (generalized): Secondary | ICD-10-CM | POA: Diagnosis not present

## 2015-11-12 DIAGNOSIS — R600 Localized edema: Secondary | ICD-10-CM | POA: Diagnosis not present

## 2015-11-12 DIAGNOSIS — R262 Difficulty in walking, not elsewhere classified: Secondary | ICD-10-CM

## 2015-11-12 NOTE — Therapy (Signed)
[place] [address] [place], [place], [address] Phone: [phone]   Fax:  [phone]  Physical Therapy Treatment  Patient Details  Name: [name] [name] MRN: [id number] Date of Birth: 04-Dec-1950 Referring Provider: Arther Abbott  Encounter Date: 11/12/2015      PT End of Session - 11/12/15 0857    Visit Number 6   Number of Visits 12   Date for PT Re-Evaluation 11/22/15   Authorization Type Medicare    Authorization Time Period 10/25/15 to 11/24/15   Authorization - Visit Number 6   Authorization - Number of Visits 10   PT Start Time 0815   PT Stop Time 0853   PT Time Calculation (min) 38 min   Activity Tolerance Patient tolerated treatment well   Behavior During Therapy [facility] for tasks assessed/performed      Past Medical History  Diagnosis Date  . COPD (chronic obstructive pulmonary disease) ([place])   . Sinusitis   . PUD (peptic ulcer disease)     remote, reports f/u EGD about 8 years ago unremarkable   . GERD (gastroesophageal reflux disease)   . Hyperlipidemia   . Essential hypertension   . Post traumatic stress disorder   . Obesity     Truncal  . CAD (coronary artery disease)     a. 2014 MV: abnl w/ infap ischemia; b. 03/2013 Cath: aneurysmal bleb in the LAD w/ otw nonobs dzs-->Med Rx.  . Hepatomegaly     noted on noncontrast CT 2015  . Vitamin D deficiency   . Chronic back pain   . Chronic shoulder pain   . Chronic knee pain     a. 09/2015 s/p R TKA.  Marland Kitchen Chronic pain   . Obstructive sleep apnea     cpap - settings at 4   . Arthritis   . On home oxygen therapy     uses 2l when is going somewhere per patient   . CKD (chronic kidney disease), stage IV ([place])     stage 4 per office visit note of Dr Lowanda Foster on 05/2015   . Chronic sinusitis   . Reactive airway disease     related to exposure to chemical during 9/11  . Bell palsy     Past Surgical History  Procedure Laterality Date  . Asad lt shoulder   12/2008    left shoulder  . Umbilical hernia repair  2007    [place]  . Knee arthroscopy  10/2007    left  . Toenail excision      removed x2-bilateral  . Eye surgery  12/22/2010    tear duct probing-[place]  . Foreign body removal  03/29/2011    Procedure: REMOVAL FOREIGN BODY EXTREMITY;  Surgeon: Arther Abbott, MD;  Location: [facility];  Service: Orthopedics;  Laterality: Right;  Removal Foreign Body Right Thumb  . Quadriceps tendon repair  07/21/2011    Procedure: REPAIR QUADRICEP TENDON;  Surgeon: Arther Abbott, MD;  Location: [facility];  Service: Orthopedics;  Laterality: Right;  . Nm myoview ltd    . Doppler echocardiography    . Left heart catheterization with coronary angiogram N/A 03/28/2013    Procedure: LEFT HEART CATHETERIZATION WITH CORONARY ANGIOGRAM;  Surgeon: Leonie Man, MD;  Location: [facility] CATH LAB;  Service: Cardiovascular;  Laterality: N/A;  . Colonoscopy  10/2008    Fields: Rectal polyp obliterated, not retrieved, hemorrhoids, single ascending colon diverticulum near the CV. Next colonoscopy April 2020  . Colonoscopy N/A 12/25/2014  SLF: 1. Colorectal polyps (2) removed 2. Small internal hemorrhoids 3. the left colon is severely redundant  . Esophagogastroduodenoscopy N/A 12/25/2014    SLF: 1. Anemia most likely due to CRI, gastritis, gastric polyps 2. Moderate non-erosive gastriits and mild duodenitis.  3.TWo large gstric polyps removed.   Marland Kitchen Penile prosthesis implant N/A 08/16/2015    Procedure: PENILE PROTHESIS INFLATABLE, three piece, Excisional biopsy of Penile ulcer, Penile molding;  Surgeon: Carolan Clines, MD;  Location: [facility];  Service: Urology;  Laterality: N/A;  . Knee arthroscopy with lateral menisectomy Right 10/14/2015    Procedure: LEFT KNEE ARTHROSCOPY WITH PARTIAL LATERAL MENISECTOMY;  Surgeon: Carole Civil, MD;  Location: [facility];  Service: Orthopedics;  Laterality: Right;    There were no vitals filed for this visit.      Subjective  Assessment - 11/12/15 0818    Subjective Pt states he is doing well, but is a little stiff. No pain currently and he feels he is doing well. No issues with HEP.   Pertinent History OSA, DM with CKD stage 4, LBP, GERD, SOB with dyspnea, PTSD    Patient Stated Goals get back to PLOF    Currently in Pain? No/denies   Pain Onset 1 to 4 weeks ago                         [facility] Adult PT Treatment/Exercise - 11/12/15 0001    Knee/Hip Exercises: Aerobic   Stationary Bike 5 min L1, seat 9   Knee/Hip Exercises: Machines for Strengthening   Total Gym Leg Press L34, 2x10, R single leg squat   Knee/Hip Exercises: Standing   Step Down 2 sets;Right;10 reps;Hand Hold: 2;Step Height: 4";Hand Hold: 1   SLS with Vectors Rt SLS with cone taps x3 rounds; Lt/Rt SLS 30 sec each  SLS on foam R: 15 sec, L: 12 sec   Knee/Hip Exercises: Seated   Hamstring Curl Right;1 set;20 reps   Knee/Hip Exercises: Supine   Other Supine Knee/Hip Exercises bridge hold with alternating knee extension x5 reps                PT Education - 11/12/15 0856    Education provided Yes   Education Details discussed decreased PT frequency to 2x/week.   Person(s) Educated Patient   Methods Explanation   Comprehension Verbalized understanding          PT Short Term Goals - 10/29/15 0913    PT SHORT TERM GOAL #1   Title Patient will demosntrate R knee ROM of 0-120 degrees in order to assist in improving overall mobility and gait, reduce overall pain    Baseline 10/29/2015: AROM 0-124 degrees   Time 2   Period Weeks   Status Achieved   PT SHORT TERM GOAL #2   Title Patient to demonstrate improved gait mechanics, including even step length/stance times, consistent heel-toe pattern, no unsteadiness, in order to improve overall mobilty    Status On-going   PT SHORT TERM GOAL #3   Title Patient to report no more than 3/10 pain with all functional tasks and activities in R knee in order to assist in  returning to PLOF, including re-achieving a full night's sleep on a consistent basis    Status On-going   PT SHORT TERM GOAL #4   Title Patient to be independent in appropriate HEP, to be updated PRN    Status On-going  PT Long Term Goals - 10/25/15 1615    PT LONG TERM GOAL #1   Title Patient to demonstrate strength 5/5 in all tested muscle groups in order to enhance regional stability, reduce pain, and assist in returning to PLOF    Time 4   Period Weeks   Status New   PT LONG TERM GOAL #2   Title Patient to experience no more than 2/10 in R knee during all functional activities and physical tasks in order to assist in returning to PLOF with minimal discomfort    Time 4   Period Weeks   Status New   PT LONG TERM GOAL #3   Title Patient will be able to reciporcally ascend/descend full flight of stairs with reciprocal pattern, good eccentric control, and minimal unsteadiness  in order to assist in improving mobility in community and at home    Time 4   Period Weeks   Status New   PT LONG TERM GOAL #4   Title Patient to report he has been able to make a full return to PLOF, including regular gardening, with R knee pain no more than 2/10 in order to return to all desired and required activities and improve QOL    Time 4   Period Weeks   Status New               Plan - 11/12/15 [id number]    Clinical Impression Statement Today's session focused on RLE strengthening and maual to quad for improved relaxation. Pt demonstrating limited eccentric control of Rt quad during descending steps. Also noted muscle shaking with single leg squat. Pt tolerated increased reps and difficutly of therex without increase in knee pain. Decreased frequency to 2x/week at this time. Will continue with current POC.    Rehab Potential Good   PT Frequency 2x / week   PT Duration 4 weeks   PT Treatment/Interventions ADLs/Self Care Home Management;Biofeedback;Cryotherapy;Gait training;Stair  training;Functional mobility training;Therapeutic activities;Therapeutic exercise;Balance training;Neuromuscular re-education;Patient/family education;Manual techniques;Scar mobilization;Passive range of motion;Energy conservation;Taping   PT Next Visit Plan Continue to progress closed chain activities as ROM is WNL. continue functional stretches and strenghtening, gait training, manual for pain and edema control PRN.  Begin vector stance and increase step up height when able.     PT Home Exercise Plan updated with self rolling and massage to Rt quad   Consulted and Agree with Plan of Care Patient      Patient will benefit from skilled therapeutic intervention in order to improve the following deficits and impairments:  Abnormal gait, Decreased skin integrity, Hypomobility, Decreased strength, Pain, Difficulty walking, Improper body mechanics, Decreased coordination, Impaired flexibility  Visit Diagnosis: Stiffness of right knee, not elsewhere classified  Pain in right knee  Localized edema  Muscle weakness (generalized)  Difficulty in walking, not elsewhere classified     Problem List Patient Active Problem List   Diagnosis Date Noted  . CAD (coronary artery disease)   . Lateral meniscus tear   . Erectile dysfunction 08/16/2015  . Hemorrhoids 07/05/2015  . Vitamin D deficiency 06/04/2015  . OSA on CPAP 05/14/2015  . Anemia 03/24/2015  . Hepatomegaly 12/01/2014  . Abnormal CT of liver 12/01/2014  . IDA (iron deficiency anemia) 12/01/2014  . Abnormal EKG 05/29/2014  . Chronic low back pain 06/16/2013  . Arthritis pain of hand 06/10/2013  . Back pain 06/05/2013  . Abnormal nuclear stress test 03/28/2013  . Muscle weakness (generalized) 05/20/2012  . Partial nontraumatic rupture of rotator cuff  05/15/2012  . Shoulder arthritis 05/15/2012  . Rotator cuff tear, right 04/25/2012  . Knee pain 11/29/2011  . Difficulty in walking(719.7) 09/04/2011  . Stiffness of joint, not  elsewhere classified, lower leg 09/04/2011  . Rupture quadriceps tendon 08/03/2011  . Quadriceps tendon rupture 07/20/2011  . Foreign body of thumb, right 03/28/2011  . OA (osteoarthritis) of knee 03/15/2011  . Chronic pain 03/15/2011  . CTS (carpal tunnel syndrome) 03/15/2011  . KNEE, ARTHRITIS, DEGEN./OSTEO 09/09/2009  . DERANGEMENT MENISCUS 08/19/2009  . SHOULDER, ARTHRITIS, DEGEN./OSTEO 04/05/2009  . RUPTURE ROTATOR CUFF 12/28/2008  . BACK PAIN 10/21/2008  . Type 2 diabetes mellitus with stage 4 chronic kidney disease ([place]) 09/29/2008  . Hyperlipidemia 09/29/2008  . Essential hypertension 09/29/2008  . GERD 09/29/2008  . HEMATOCHEZIA 09/29/2008  . TENDINITIS, CALCIFIC, SHOULDER, LEFT 08/06/2008  . SHOULDER PAIN 06/29/2008  . IMPINGEMENT SYNDROME 06/29/2008    9:38 AM,11/12/2015 Elly Modena PT, DPT [facility] [place] [address] [place], [place], [address] Phone: [phone]   Fax:  [phone]  Name: [name] [name] MRN: [id number] Date of Birth: 04-29-1951

## 2015-11-15 ENCOUNTER — Telehealth (HOSPITAL_COMMUNITY): Payer: Self-pay

## 2015-11-15 ENCOUNTER — Ambulatory Visit (HOSPITAL_COMMUNITY): Payer: Medicare Other | Attending: Orthopedic Surgery | Admitting: Physical Therapy

## 2015-11-15 DIAGNOSIS — R262 Difficulty in walking, not elsewhere classified: Secondary | ICD-10-CM

## 2015-11-15 DIAGNOSIS — M6281 Muscle weakness (generalized): Secondary | ICD-10-CM | POA: Diagnosis not present

## 2015-11-15 DIAGNOSIS — R600 Localized edema: Secondary | ICD-10-CM | POA: Diagnosis not present

## 2015-11-15 DIAGNOSIS — M25661 Stiffness of right knee, not elsewhere classified: Secondary | ICD-10-CM | POA: Insufficient documentation

## 2015-11-15 DIAGNOSIS — M25561 Pain in right knee: Secondary | ICD-10-CM | POA: Diagnosis not present

## 2015-11-15 NOTE — Telephone Encounter (Signed)
11/15/15 cx 5/3 because he is having surgery on his back and isn't sure if he can do therapy

## 2015-11-15 NOTE — Therapy (Signed)
[place], [place], [address] Phone: [phone]   Fax:  [phone]  Physical Therapy Treatment  Patient Details  Name: [name] [name] MRN: [id number] Date of Birth: [birth year] Referring Provider: Arther Abbott  Encounter Date: 11/15/2015      PT End of Session - 11/15/15 0901    Visit Number 7   Number of Visits 12   Date for PT Re-Evaluation 11/22/15   Authorization Type Medicare    Authorization Time Period 10/25/15 to 11/24/15   Authorization - Visit Number 7   Authorization - Number of Visits 10   PT Start Time 0815   PT Stop Time [id number]   PT Time Calculation (min) 39 min   Activity Tolerance Patient tolerated treatment well   Behavior During Therapy [facility] for tasks assessed/performed      Past Medical History  Diagnosis Date  . COPD (chronic obstructive pulmonary disease) ([place])   . Sinusitis   . PUD (peptic ulcer disease)     remote, reports f/u EGD about 8 years ago unremarkable   . GERD (gastroesophageal reflux disease)   . Hyperlipidemia   . Essential hypertension   . Post traumatic stress disorder   . Obesity     Truncal  . CAD (coronary artery disease)     a. 2014 MV: abnl w/ infap ischemia; b. 03/2013 Cath: aneurysmal bleb in the LAD w/ otw nonobs dzs-->Med Rx.  . Hepatomegaly     noted on noncontrast CT 2015  . Vitamin D deficiency   . Chronic back pain   . Chronic shoulder pain   . Chronic knee pain     a. 09/2015 s/p R TKA.  Marland Kitchen Chronic pain   . Obstructive sleep apnea     cpap - settings at 4   . Arthritis   . On home oxygen therapy     uses 2l when is going somewhere per patient   . CKD (chronic kidney disease), stage IV ([place])     stage 4 per office visit note of Dr Lowanda Foster on 05/2015   . Chronic sinusitis   . Reactive airway disease     related to exposure to chemical during 9/11  . Bell palsy     Past Surgical History  Procedure Laterality Date  . Asad lt shoulder   12/2008    left shoulder  . Umbilical hernia repair  2007    [place]  . Knee arthroscopy  10/2007    left  . Toenail excision      removed x2-bilateral  . Eye surgery  12/22/2010    tear duct probing-[place]  . Foreign body removal  03/29/2011    Procedure: REMOVAL FOREIGN BODY EXTREMITY;  Surgeon: Arther Abbott, MD;  Location: [facility];  Service: Orthopedics;  Laterality: Right;  Removal Foreign Body Right Thumb  . Quadriceps tendon repair  07/21/2011    Procedure: REPAIR QUADRICEP TENDON;  Surgeon: Arther Abbott, MD;  Location: [facility]S;  Service: Orthopedics;  Laterality: Right;  . Nm myoview ltd    . Doppler echocardiography    . Left heart catheterization with coronary angiogram N/A 03/28/2013    Procedure: LEFT HEART CATHETERIZATION WITH CORONARY ANGIOGRAM;  Surgeon: Leonie Man, MD;  Location: [facility] CATH LAB;  Service: Cardiovascular;  Laterality: N/A;  . Colonoscopy  10/2008    Fields: Rectal polyp obliterated, not retrieved, hemorrhoids, single ascending colon diverticulum near the CV. Next colonoscopy April 2020  . Colonoscopy N/A 12/25/2014  SLF: 1. Colorectal polyps (2) removed 2. Small internal hemorrhoids 3. the left colon is severely redundant  . Esophagogastroduodenoscopy N/A 12/25/2014    SLF: 1. Anemia most likely due to CRI, gastritis, gastric polyps 2. Moderate non-erosive gastriits and mild duodenitis.  3.TWo large gstric polyps removed.   Marland Kitchen Penile prosthesis implant N/A 08/16/2015    Procedure: PENILE PROTHESIS INFLATABLE, three piece, Excisional biopsy of Penile ulcer, Penile molding;  Surgeon: Carolan Clines, MD;  Location: [facility];  Service: Urology;  Laterality: N/A;  . Knee arthroscopy with lateral menisectomy Right 10/14/2015    Procedure: LEFT KNEE ARTHROSCOPY WITH PARTIAL LATERAL MENISECTOMY;  Surgeon: Carole Civil, MD;  Location: [facility] ORS;  Service: Orthopedics;  Laterality: Right;    There were no vitals filed for this visit.      Subjective  Assessment - 11/15/15 0818    Subjective Patient doing well today, no pain and had a good weekend    Pertinent History OSA, DM with CKD stage 4, LBP, GERD, SOB with dyspnea, PTSD    Currently in Pain? No/denies                         [facility] Adult PT Treatment/Exercise - 11/15/15 0001    Knee/Hip Exercises: Stretches   Active Hamstring Stretch 3 reps;30 seconds;Both   Active Hamstring Stretch Limitations 12 inch box    Piriformis Stretch Both;2 reps;30 seconds   Piriformis Stretch Limitations seated   Gastroc Stretch Both;3 reps;30 seconds   Gastroc Stretch Limitations slantboard    Knee/Hip Exercises: Standing   Heel Raises 20 reps   Heel Raises Limitations heel toe, no UEs    Forward Lunges Both;15 reps   Forward Lunges Limitations 4in step, no UEs    Side Lunges Both;1 set;10 reps   Side Lunges Limitations 4 inch step    Step Down Both;1 set;10 reps   Step Down Limitations 4 inch box    Rocker Board 2 minutes   Rocker Board Limitations lateral and AP, no HHA    Knee/Hip Exercises: Supine   Other Supine Knee/Hip Exercises bridge hold with alternating knee extension x10 reps   Other Supine Knee/Hip Exercises supine hip ABd 1x10                PT Education - 11/15/15 0900    Education provided No          PT Short Term Goals - 10/29/15 0913    PT SHORT TERM GOAL #1   Title Patient will demosntrate R knee ROM of 0-120 degrees in order to assist in improving overall mobility and gait, reduce overall pain    Baseline 10/29/2015: AROM 0-124 degrees   Time 2   Period Weeks   Status Achieved   PT SHORT TERM GOAL #2   Title Patient to demonstrate improved gait mechanics, including even step length/stance times, consistent heel-toe pattern, no unsteadiness, in order to improve overall mobilty    Status On-going   PT SHORT TERM GOAL #3   Title Patient to report no more than 3/10 pain with all functional tasks and activities in R knee in order to assist  in returning to PLOF, including re-achieving a full night's sleep on a consistent basis    Status On-going   PT SHORT TERM GOAL #4   Title Patient to be independent in appropriate HEP, to be updated PRN    Status On-going  PT Long Term Goals - 10/25/15 1615    PT LONG TERM GOAL #1   Title Patient to demonstrate strength 5/5 in all tested muscle groups in order to enhance regional stability, reduce pain, and assist in returning to PLOF    Time 4   Period Weeks   Status New   PT LONG TERM GOAL #2   Title Patient to experience no more than 2/10 in R knee during all functional activities and physical tasks in order to assist in returning to PLOF with minimal discomfort    Time 4   Period Weeks   Status New   PT LONG TERM GOAL #3   Title Patient will be able to reciporcally ascend/descend full flight of stairs with reciprocal pattern, good eccentric control, and minimal unsteadiness  in order to assist in improving mobility in community and at home    Time 4   Period Weeks   Status New   PT LONG TERM GOAL #4   Title Patient to report he has been able to make a full return to PLOF, including regular gardening, with R knee pain no more than 2/10 in order to return to all desired and required activities and improve QOL    Time 4   Period Weeks   Status New               Plan - 11/15/15 0834    Clinical Impression Statement Focused on fucntional strengthening in open and closed chain positions today, with introduction of supine piriformis stretch and supine hip abduction with therband today; otherwise continued with thorough CKC exercise program with focus on muscle control and form. Patient did not have exacerbation of knee pain howeever did complain of muscle fatigue and reported that exercises today were challenging overall.    Rehab Potential Good   PT Frequency 2x / week   PT Duration 4 weeks   PT Treatment/Interventions ADLs/Self Care Home  Management;Biofeedback;Cryotherapy;Gait training;Stair training;Functional mobility training;Therapeutic activities;Therapeutic exercise;Balance training;Neuromuscular re-education;Patient/family education;Manual techniques;Scar mobilization;Passive range of motion;Energy conservation;Taping   PT Next Visit Plan Continue to progress closed chain activities as ROM is WNL. continue functional stretches and strenghtening, gait training, manual for pain and edema control PRN.  Begin vector stance and increase step up height when able.     PT Home Exercise Plan no changes today    Consulted and Agree with Plan of Care Patient      Patient will benefit from skilled therapeutic intervention in order to improve the following deficits and impairments:  Abnormal gait, Decreased skin integrity, Hypomobility, Decreased strength, Pain, Difficulty walking, Improper body mechanics, Decreased coordination, Impaired flexibility  Visit Diagnosis: Stiffness of right knee, not elsewhere classified  Pain in right knee  Localized edema  Muscle weakness (generalized)  Difficulty in walking, not elsewhere classified     Problem List Patient Active Problem List   Diagnosis Date Noted  . CAD (coronary artery disease)   . Lateral meniscus tear   . Erectile dysfunction 08/16/2015  . Hemorrhoids 07/05/2015  . Vitamin D deficiency 06/04/2015  . OSA on CPAP 05/14/2015  . Anemia 03/24/2015  . Hepatomegaly 12/01/2014  . Abnormal CT of liver 12/01/2014  . IDA (iron deficiency anemia) 12/01/2014  . Abnormal EKG 05/29/2014  . Chronic low back pain 06/16/2013  . Arthritis pain of hand 06/10/2013  . Back pain 06/05/2013  . Abnormal nuclear stress test 03/28/2013  . Muscle weakness (generalized) 05/20/2012  . Partial nontraumatic rupture of rotator cuff 05/15/2012  .  Shoulder arthritis 05/15/2012  . Rotator cuff tear, right 04/25/2012  . Knee pain 11/29/2011  . Difficulty in walking(719.7) 09/04/2011  .  Stiffness of joint, not elsewhere classified, lower leg 09/04/2011  . Rupture quadriceps tendon 08/03/2011  . Quadriceps tendon rupture 07/20/2011  . Foreign body of thumb, right 03/28/2011  . OA (osteoarthritis) of knee 03/15/2011  . Chronic pain 03/15/2011  . CTS (carpal tunnel syndrome) 03/15/2011  . KNEE, ARTHRITIS, DEGEN./OSTEO 09/09/2009  . DERANGEMENT MENISCUS 08/19/2009  . SHOULDER, ARTHRITIS, DEGEN./OSTEO 04/05/2009  . RUPTURE ROTATOR CUFF 12/28/2008  . BACK PAIN 10/21/2008  . Type 2 diabetes mellitus with stage 4 chronic kidney disease ([place]) 09/29/2008  . Hyperlipidemia 09/29/2008  . Essential hypertension 09/29/2008  . GERD 09/29/2008  . HEMATOCHEZIA 09/29/2008  . TENDINITIS, CALCIFIC, SHOULDER, LEFT 08/06/2008  . SHOULDER PAIN 06/29/2008  . IMPINGEMENT SYNDROME 06/29/2008    Deniece Ree PT, DPT [place] [address] [place], [place], [address] Phone: [phone]   Fax:  [phone]  Name: [name] [name] MRN: [id number] Date of Birth: Dec 05, 1950

## 2015-11-16 DIAGNOSIS — M545 Low back pain: Secondary | ICD-10-CM | POA: Diagnosis not present

## 2015-11-16 DIAGNOSIS — M47816 Spondylosis without myelopathy or radiculopathy, lumbar region: Secondary | ICD-10-CM | POA: Diagnosis not present

## 2015-11-17 ENCOUNTER — Ambulatory Visit (HOSPITAL_COMMUNITY): Payer: Medicare Other

## 2015-11-19 ENCOUNTER — Encounter (HOSPITAL_COMMUNITY): Payer: Self-pay | Admitting: Physical Therapy

## 2015-11-22 ENCOUNTER — Ambulatory Visit (HOSPITAL_COMMUNITY): Payer: Medicare Other | Admitting: Physical Therapy

## 2015-11-22 DIAGNOSIS — M25561 Pain in right knee: Secondary | ICD-10-CM

## 2015-11-22 DIAGNOSIS — M6281 Muscle weakness (generalized): Secondary | ICD-10-CM | POA: Diagnosis not present

## 2015-11-22 DIAGNOSIS — M25661 Stiffness of right knee, not elsewhere classified: Secondary | ICD-10-CM | POA: Diagnosis not present

## 2015-11-22 DIAGNOSIS — R600 Localized edema: Secondary | ICD-10-CM

## 2015-11-22 DIAGNOSIS — R262 Difficulty in walking, not elsewhere classified: Secondary | ICD-10-CM

## 2015-11-22 NOTE — Therapy (Signed)
[place], [place], [address] Phone: [phone]   Fax:  [phone]  Physical Therapy Treatment (Discharge)  Patient Details  Name: [name] [name] MRN: [id number] Date of Birth: October 15, 1950 Referring Provider: Arther Abbott  Encounter Date: 11/22/2015      PT End of Session - 11/22/15 0900    Visit Number 8   Number of Visits 8   Authorization Type Medicare    Authorization Time Period 10/25/15 to 11/24/15   Authorization - Visit Number 8   Authorization - Number of Visits 10   PT Start Time 0818   PT Stop Time 4270   PT Time Calculation (min) 39 min   Activity Tolerance Patient tolerated treatment well   Behavior During Therapy [facility] for tasks assessed/performed      Past Medical History  Diagnosis Date  . COPD (chronic obstructive pulmonary disease) ([place])   . Sinusitis   . PUD (peptic ulcer disease)     remote, reports f/u EGD about 8 years ago unremarkable   . GERD (gastroesophageal reflux disease)   . Hyperlipidemia   . Essential hypertension   . Post traumatic stress disorder   . Obesity     Truncal  . CAD (coronary artery disease)     a. 2014 MV: abnl w/ infap ischemia; b. 03/2013 Cath: aneurysmal bleb in the LAD w/ otw nonobs dzs-->Med Rx.  . Hepatomegaly     noted on noncontrast CT 2015  . Vitamin D deficiency   . Chronic back pain   . Chronic shoulder pain   . Chronic knee pain     a. 09/2015 s/p R TKA.  Marland Kitchen Chronic pain   . Obstructive sleep apnea     cpap - settings at 4   . Arthritis   . On home oxygen therapy     uses 2l when is going somewhere per patient   . CKD (chronic kidney disease), stage IV ([place])     stage 4 per office visit note of Dr Lowanda Foster on 05/2015   . Chronic sinusitis   . Reactive airway disease     related to exposure to chemical during 9/11  . Bell palsy     Past Surgical History  Procedure Laterality Date  . Asad lt shoulder  12/2008    left shoulder  .  Umbilical hernia repair  2007    [place]  . Knee arthroscopy  10/2007    left  . Toenail excision      removed x2-bilateral  . Eye surgery  12/22/2010    tear duct probing-[place]  . Foreign body removal  03/29/2011    Procedure: REMOVAL FOREIGN BODY EXTREMITY;  Surgeon: Arther Abbott, MD;  Location: [facility];  Service: Orthopedics;  Laterality: Right;  Removal Foreign Body Right Thumb  . Quadriceps tendon repair  07/21/2011    Procedure: REPAIR QUADRICEP TENDON;  Surgeon: Arther Abbott, MD;  Location: [facility];  Service: Orthopedics;  Laterality: Right;  . Nm myoview ltd    . Doppler echocardiography    . Left heart catheterization with coronary angiogram N/A 03/28/2013    Procedure: LEFT HEART CATHETERIZATION WITH CORONARY ANGIOGRAM;  Surgeon: Leonie Man, MD;  Location: [facility] CATH LAB;  Service: Cardiovascular;  Laterality: N/A;  . Colonoscopy  10/2008    Fields: Rectal polyp obliterated, not retrieved, hemorrhoids, single ascending colon diverticulum near the CV. Next colonoscopy April 2020  . Colonoscopy N/A 12/25/2014    SLF: 1. Colorectal  polyps (2) removed 2. Small internal hemorrhoids 3. the left colon is severely redundant  . Esophagogastroduodenoscopy N/A 12/25/2014    SLF: 1. Anemia most likely due to CRI, gastritis, gastric polyps 2. Moderate non-erosive gastriits and mild duodenitis.  3.TWo large gstric polyps removed.   Marland Kitchen Penile prosthesis implant N/A 08/16/2015    Procedure: PENILE PROTHESIS INFLATABLE, three piece, Excisional biopsy of Penile ulcer, Penile molding;  Surgeon: Carolan Clines, MD;  [facility];  Service: Urology;  Laterality: N/A;  . Knee arthroscopy with lateral menisectomy Right 10/14/2015    Procedure: LEFT KNEE ARTHROSCOPY WITH PARTIAL LATERAL MENISECTOMY;  Surgeon: Carole Civil, MD;  Location: [facility];  Service: Orthopedics;  Laterality: Right;    There were no vitals filed for this visit.      Subjective Assessment - 11/22/15 0819     Subjective Patient doing very well, reports taht he is definitely feeling better and reports that his normal activities have become much easier. Cannot identify anything in specific that is still hard for him to do right now. No falls or close calls recently. Had a little back surgery last Tuesday- MD went in and burnt nerves in his back, reprots he feels fine and has no specific restrictions from MD.    Pertinent History OSA, DM with CKD stage 4, LBP, GERD, SOB with dyspnea, PTSD    How long can you sit comfortably? 5/8- could sit to eat  a full meal, about 30 minutes    How long can you stand comfortably? 5/8- 15 minutes    How long can you walk comfortably? 5/8- 2 miles    Patient Stated Goals get back to PLOF    Currently in Pain? No/denies            [facility] PT Assessment - 11/22/15 0001    Observation/Other Assessments   Focus on Therapeutic Outcomes (FOTO)  34% limited    AROM   Right Knee Extension 1   Right Knee Flexion 122   Strength   Right Hip Flexion 5/5   Right Hip Extension 5/5   Right Hip ABduction 5/5   Left Hip Flexion 5/5   Left Hip Extension 5/5   Left Hip ABduction 5/5   Right Knee Flexion 5/5   Right Knee Extension 5/5   Left Knee Flexion 5/5   Left Knee Extension 5/5   Right Ankle Dorsiflexion 5/5   Left Ankle Dorsiflexion 5/5   Transfers   Five time sit to stand comments  4.9   6 minute walk test results    Aerobic Endurance Distance Walked 1469   Endurance additional comments 6MWT    Timed Up and Go Test   Normal TUG (seconds) 7.01                     [facility] Adult PT Treatment/Exercise - 11/22/15 0001    Knee/Hip Exercises: Standing   Heel Raises 20 reps   Heel Raises Limitations heel toe, no UEs    Forward Lunges Both;15 reps   Forward Lunges Limitations 2 inch step    Side Lunges Both;1 set;10 reps   Side Lunges Limitations 2 inch step    Rocker Board 2 minutes   Rocker Board Limitations lateral and AP, no HHA                  PT Education - 11/22/15 0849    Education provided Yes   Education Details progress with skilled PT services, DC  today, [employer] waiver    Person(s) Educated Patient   Methods Explanation   Comprehension Verbalized understanding          PT Short Term Goals - 11/22/15 [phone]    PT SHORT TERM GOAL #1   Title Patient will demosntrate R knee ROM of 0-120 degrees in order to assist in improving overall mobility and gait, reduce overall pain    Baseline 5/8- 1 to 122 today, 0-124 at best   Time 2   Period Weeks   Status Achieved   PT SHORT TERM GOAL #2   Title Patient to demonstrate improved gait mechanics, including even step length/stance times, consistent heel-toe pattern, no unsteadiness, in order to improve overall mobilty    Time 2   Period Weeks   Status Achieved   PT SHORT TERM GOAL #3   Title Patient to report no more than 3/10 pain with all functional tasks and activities in R knee in order to assist in returning to PLOF, including re-achieving a full night's sleep on a consistent basis    Baseline 5/8- pain has been 0/10, has been able to sleep    Time 2   Period Weeks   Status Achieved   PT SHORT TERM GOAL #4   Title Patient to be independent in appropriate HEP, to be updated PRN    Baseline 5/8- doing every day    Time 1   Period Days   Status Achieved           PT Long Term Goals - 11/22/15 0839    PT LONG TERM GOAL #1   Title Patient to demonstrate strength 5/5 in all tested muscle groups in order to enhance regional stability, reduce pain, and assist in returning to PLOF    Time 4   Period Weeks   Status Achieved   PT LONG TERM GOAL #2   Title Patient to experience no more than 2/10 in R knee during all functional activities and physical tasks in order to assist in returning to PLOF with minimal discomfort    Baseline 5/8- no pain    Time 4   Period Weeks   Status Achieved   PT LONG TERM GOAL #3   Title Patient will be able to  reciporcally ascend/descend full flight of stairs with reciprocal pattern, good eccentric control, and minimal unsteadiness  in order to assist in improving mobility in community and at home    Baseline 5/8- excellent stair performance today    Time 4   Period Weeks   Status Achieved   PT LONG TERM GOAL #4   Title Patient to report he has been able to make a full return to PLOF, including regular gardening, with R knee pain no more than 2/10 in order to return to all desired and required activities and improve QOL    Baseline 5/8- has made full return to gardening, no problems whatsoever    Time 4   Period Weeks   Status Achieved               Plan - 11/22/15 0845    Clinical Impression Statement Re-assessment performed today. Patient is doing excellent with skilled PT services, and shows significant and impressive improvement in all functional measurse taken today as compared to meaures taken at baseline. Patient reports being very active with outdoor gardening tasks and regular walking, squats are the hardest thing for him to do right now but he is able to do so well with  minimal difficulty if he takes his time and really focuses on form. Otherwise finished sesion with functional exercise training and provided patient with waiver for local [employer]. At this point patient is no longer in need of skilled PT services. DC today.    Rehab Potential Good   PT Next Visit Plan DC today.    PT Home Exercise Plan [employer] waiver    Consulted and Agree with Plan of Care Patient      Patient will benefit from skilled therapeutic intervention in order to improve the following deficits and impairments:  Abnormal gait, Decreased skin integrity, Hypomobility, Decreased strength, Pain, Difficulty walking, Improper body mechanics, Decreased coordination, Impaired flexibility  Visit Diagnosis: Stiffness of right knee, not elsewhere classified  Pain in right knee  Localized edema  Muscle weakness  (generalized)  Difficulty in walking, not elsewhere classified       G-Codes - 12-14-15 0850    Functional Assessment Tool Used FOTO 34% limited however per clinical assessment based on strength, balance, gait, ROM estimate approximately 20% limited    Functional Limitation Mobility: Walking and moving around   Mobility: Walking and Moving Around Goal Status [phone]) At least 40 percent but less than 60 percent impaired, limited or restricted   Mobility: Walking and Moving Around Discharge Status [phone]) At least 20 percent but less than 40 percent impaired, limited or restricted      Problem List Patient Active Problem List   Diagnosis Date Noted  . CAD (coronary artery disease)   . Lateral meniscus tear   . Erectile dysfunction 08/16/2015  . Hemorrhoids 07/05/2015  . Vitamin D deficiency 06/04/2015  . OSA on CPAP 05/14/2015  . Anemia 03/24/2015  . Hepatomegaly 12/01/2014  . Abnormal CT of liver 12/01/2014  . IDA (iron deficiency anemia) 12/01/2014  . Abnormal EKG 05/29/2014  . Chronic low back pain 06/16/2013  . Arthritis pain of hand 06/10/2013  . Back pain 06/05/2013  . Abnormal nuclear stress test 03/28/2013  . Muscle weakness (generalized) 05/20/2012  . Partial nontraumatic rupture of rotator cuff 05/15/2012  . Shoulder arthritis 05/15/2012  . Rotator cuff tear, right 04/25/2012  . Knee pain 11/29/2011  . Difficulty in walking(719.7) 09/04/2011  . Stiffness of joint, not elsewhere classified, lower leg 09/04/2011  . Rupture quadriceps tendon 08/03/2011  . Quadriceps tendon rupture 07/20/2011  . Foreign body of thumb, right 03/28/2011  . OA (osteoarthritis) of knee 03/15/2011  . Chronic pain 03/15/2011  . CTS (carpal tunnel syndrome) 03/15/2011  . KNEE, ARTHRITIS, DEGEN./OSTEO 09/09/2009  . DERANGEMENT MENISCUS 08/19/2009  . SHOULDER, ARTHRITIS, DEGEN./OSTEO 04/05/2009  . RUPTURE ROTATOR CUFF 12/28/2008  . BACK PAIN 10/21/2008  . Type 2 diabetes mellitus with  stage 4 chronic kidney disease ([place]) 09/29/2008  . Hyperlipidemia 09/29/2008  . Essential hypertension 09/29/2008  . GERD 09/29/2008  . HEMATOCHEZIA 09/29/2008  . TENDINITIS, CALCIFIC, SHOULDER, LEFT 08/06/2008  . SHOULDER PAIN 06/29/2008  . IMPINGEMENT SYNDROME 06/29/2008   PHYSICAL THERAPY DISCHARGE SUMMARY  Visits from Start of Care: 8  Current functional level related to goals / functional outcomes: Patient doing extremely well with skilled PT services, able to do everything he needs and wants right now. No major concerns at this point, patient ready for DC.   Remaining deficits: Mild difficulty squatting    Education / Equipment: DC today, [employer] waiver  Plan: Patient agrees to discharge.  Patient goals were met. Patient is being discharged due to meeting the stated rehab goals.  ?????  Deniece Ree PT, DPT [phone]  [place] [address] [place], [place], [address] Phone: [phone]   Fax:  [phone]  Name: [name] [name] MRN: [id number] Date of Birth: October 30, 1950

## 2015-11-24 ENCOUNTER — Encounter: Payer: Self-pay | Admitting: Gastroenterology

## 2015-11-24 ENCOUNTER — Encounter (HOSPITAL_COMMUNITY): Payer: Self-pay

## 2015-11-26 ENCOUNTER — Encounter (HOSPITAL_COMMUNITY): Payer: Self-pay

## 2015-12-03 ENCOUNTER — Ambulatory Visit: Payer: Self-pay | Admitting: Orthopedic Surgery

## 2015-12-03 ENCOUNTER — Other Ambulatory Visit: Payer: Self-pay | Admitting: "Endocrinology

## 2015-12-03 DIAGNOSIS — E1122 Type 2 diabetes mellitus with diabetic chronic kidney disease: Secondary | ICD-10-CM | POA: Diagnosis not present

## 2015-12-03 DIAGNOSIS — N184 Chronic kidney disease, stage 4 (severe): Secondary | ICD-10-CM | POA: Diagnosis not present

## 2015-12-03 DIAGNOSIS — Z794 Long term (current) use of insulin: Secondary | ICD-10-CM | POA: Diagnosis not present

## 2015-12-03 LAB — BASIC METABOLIC PANEL
BUN: 38 mg/dL — ABNORMAL HIGH (ref 7–25)
CALCIUM: 9.2 mg/dL (ref 8.6–10.3)
CO2: 30 mmol/L (ref 20–31)
CREATININE: 4.04 mg/dL — AB (ref 0.70–1.25)
Chloride: 105 mmol/L (ref 98–110)
GLUCOSE: 130 mg/dL — AB (ref 65–99)
Potassium: 4.1 mmol/L (ref 3.5–5.3)
SODIUM: 144 mmol/L (ref 135–146)

## 2015-12-04 LAB — HEMOGLOBIN A1C
HEMOGLOBIN A1C: 7.4 % — AB (ref <5.7)
Mean Plasma Glucose: 166 mg/dL

## 2015-12-06 ENCOUNTER — Encounter: Payer: Self-pay | Admitting: Orthopedic Surgery

## 2015-12-06 ENCOUNTER — Other Ambulatory Visit: Payer: Self-pay

## 2015-12-06 ENCOUNTER — Ambulatory Visit (INDEPENDENT_AMBULATORY_CARE_PROVIDER_SITE_OTHER): Payer: Medicare Other | Admitting: Orthopedic Surgery

## 2015-12-06 VITALS — BP 147/89 | Ht 64.0 in | Wt 244.0 lb

## 2015-12-06 DIAGNOSIS — R200 Anesthesia of skin: Secondary | ICD-10-CM

## 2015-12-06 DIAGNOSIS — Z9889 Other specified postprocedural states: Secondary | ICD-10-CM

## 2015-12-06 DIAGNOSIS — R202 Paresthesia of skin: Secondary | ICD-10-CM

## 2015-12-06 DIAGNOSIS — I2510 Atherosclerotic heart disease of native coronary artery without angina pectoris: Secondary | ICD-10-CM

## 2015-12-06 DIAGNOSIS — R160 Hepatomegaly, not elsewhere classified: Secondary | ICD-10-CM

## 2015-12-06 DIAGNOSIS — M792 Neuralgia and neuritis, unspecified: Secondary | ICD-10-CM

## 2015-12-06 MED ORDER — GABAPENTIN 100 MG PO CAPS: 100.0000 mg | ORAL_CAPSULE | Freq: Three times a day (TID) | ORAL | Status: DC

## 2015-12-06 NOTE — Patient Instructions (Addendum)
WE WILL REFER TO [facility] FOR NERVE CONDUCTION STUDY

## 2015-12-06 NOTE — Progress Notes (Signed)
Chief Complaint  Patient presents with  . Follow-up    FOLLOW UP RIGHT KNEE ARTHROSCOPY DOS 10/14/15    Gryphon had knee arthroscopy in March she is doing much better he says his knee feels good other than some occasional clicking. Is not having any pain swelling locking catching or giving way  New complaint numbness right arm  The patient complains of no neck pain but his right hand is going numb.  He denies any trauma  He also complains of shoulder pain with this  He denies any fever chills redness in the right upper extremity  BP 147/89 mmHg  Ht 5\' 4"  (1.626 m)  Wt 244 lb (110.678 kg)  BMI 41.86 kg/m2 He's in good spirits today he's awake is alert is oriented to person place and time is appearance is well groomed vitals are stable. He is walking with a normal gait pattern. He has no weakness in the right shoulder has tenderness in the interim posterior joint line has tingling in the right arm and decreased sensation in the global aspects of the right hand pulses are normal no lymphadenopathy is noted. He has good grip strength normal cuff strengthinstability in the shoulder and full range of motion in the shoulder neck elbow right wrist right hand.  Recommend Nerve conduction study to evaluate right upper extremity numbness and tingling

## 2015-12-07 ENCOUNTER — Ambulatory Visit (INDEPENDENT_AMBULATORY_CARE_PROVIDER_SITE_OTHER): Payer: Medicare Other | Admitting: "Endocrinology

## 2015-12-07 ENCOUNTER — Encounter: Payer: Self-pay | Admitting: "Endocrinology

## 2015-12-07 VITALS — BP 137/84 | HR 94 | Ht 64.0 in | Wt 245.0 lb

## 2015-12-07 DIAGNOSIS — I2510 Atherosclerotic heart disease of native coronary artery without angina pectoris: Secondary | ICD-10-CM

## 2015-12-07 DIAGNOSIS — I10 Essential (primary) hypertension: Secondary | ICD-10-CM

## 2015-12-07 DIAGNOSIS — N184 Chronic kidney disease, stage 4 (severe): Secondary | ICD-10-CM

## 2015-12-07 DIAGNOSIS — E785 Hyperlipidemia, unspecified: Secondary | ICD-10-CM | POA: Diagnosis not present

## 2015-12-07 DIAGNOSIS — E1122 Type 2 diabetes mellitus with diabetic chronic kidney disease: Secondary | ICD-10-CM

## 2015-12-07 DIAGNOSIS — Z794 Long term (current) use of insulin: Secondary | ICD-10-CM | POA: Diagnosis not present

## 2015-12-07 NOTE — Patient Instructions (Signed)

## 2015-12-07 NOTE — Progress Notes (Signed)
Subjective:    Patient ID: [name] [name], male    DOB: Mar 28, 1951, PCP [name], MD   Past Medical History  Diagnosis Date  . COPD (chronic obstructive pulmonary disease) ([place])   . Sinusitis   . PUD (peptic ulcer disease)     remote, reports f/u EGD about 8 years ago unremarkable   . GERD (gastroesophageal reflux disease)   . Hyperlipidemia   . Essential hypertension   . Post traumatic stress disorder   . Obesity     [place]  . CAD (coronary artery disease)     a. 2014 MV: abnl w/ infap ischemia; b. 03/2013 Cath: aneurysmal bleb in the LAD w/ otw nonobs dzs-->Med Rx.  . Hepatomegaly     noted on noncontrast CT 2015  . Vitamin D deficiency   . Chronic back pain   . Chronic shoulder pain   . Chronic knee pain     a. 09/2015 s/p R TKA.  Marland Kitchen Chronic pain   . Obstructive sleep apnea     cpap - settings at 4   . Arthritis   . On home oxygen therapy     uses 2l when is going somewhere per patient   . CKD (chronic kidney disease), stage IV ([place])     stage 4 per office visit note of Dr Lowanda Foster on 05/2015   . Chronic sinusitis   . Reactive airway disease     related to exposure to chemical during 9/11  . Bell palsy    Past Surgical History  Procedure Laterality Date  . Asad lt shoulder  12/2008    left shoulder  . Umbilical hernia repair  2007    [place]  . Knee arthroscopy  10/2007    left  . Toenail excision      removed x2-bilateral  . Eye surgery  12/22/2010    tear duct probing-[place]  . Foreign body removal  03/29/2011    Procedure: REMOVAL FOREIGN BODY EXTREMITY;  Surgeon: Arther Abbott, MD;  Location: [facility];  Service: Orthopedics;  Laterality: Right;  Removal Foreign Body Right Thumb  . Quadriceps tendon repair  07/21/2011    Procedure: REPAIR QUADRICEP TENDON;  Surgeon: Arther Abbott, MD;  Location: [facility];  Service: Orthopedics;  Laterality: Right;  . Nm myoview ltd    . Doppler echocardiography    . Left heart catheterization with coronary  angiogram N/A 03/28/2013    Procedure: LEFT HEART CATHETERIZATION WITH CORONARY ANGIOGRAM;  Surgeon: Leonie Man, MD;  Location: [facility] CATH LAB;  Service: Cardiovascular;  Laterality: N/A;  . Colonoscopy  10/2008    Fields: Rectal polyp obliterated, not retrieved, hemorrhoids, single ascending colon diverticulum near the CV. Next colonoscopy April 2020  . Colonoscopy N/A 12/25/2014    SLF: 1. Colorectal polyps (2) removed 2. Small internal hemorrhoids 3. the left colon is severely redundant  . Esophagogastroduodenoscopy N/A 12/25/2014    SLF: 1. Anemia most likely due to CRI, gastritis, gastric polyps 2. Moderate non-erosive gastriits and mild duodenitis.  3.TWo large gstric polyps removed.   Marland Kitchen Penile prosthesis implant N/A 08/16/2015    Procedure: PENILE PROTHESIS INFLATABLE, three piece, Excisional biopsy of Penile ulcer, Penile molding;  Surgeon: Carolan Clines, MD;  Location: [facility];  Service: Urology;  Laterality: N/A;  . Knee arthroscopy with lateral menisectomy Right 10/14/2015    Procedure: LEFT KNEE ARTHROSCOPY WITH PARTIAL LATERAL MENISECTOMY;  Surgeon: Carole Civil, MD;  Location: [facility];  Service: Orthopedics;  Laterality: Right;  Social History   Social History  . Marital Status: Married    Spouse Name: N/A  . Number of Children: 2  . Years of Education: 12th grade   Occupational History  . disabled   .     Marland Kitchen     Social History Main Topics  . Smoking status: Former Smoker -- 1.00 packs/day for 25 years    Types: Cigarettes    Quit date: 03/27/2010  . Smokeless tobacco: Never Used     Comment: Quit x 7 years  . Alcohol Use: 0.0 oz/week    0 Standard drinks or equivalent per week     Comment: occasionally  . Drug Use: Yes    Special: Cocaine, Marijuana     Comment: stopped feb2011  . Sexual Activity: Not Asked   Other Topics Concern  . None   Social History Narrative   He quit smoking in 2010. 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 He developed pulmonary problems, became disabled because of lower airway disease in 2009.    Outpatient Encounter Prescriptions as of 12/07/2015  Medication Sig  . albuterol (PROVENTIL HFA) 108 (90 BASE) MCG/ACT inhaler Inhale 2 puffs into the lungs every 4 (four) hours as needed for wheezing.   Marland Kitchen amLODipine (NORVASC) 10 MG tablet Take 10 mg by mouth daily.   . budesonide-formoterol (SYMBICORT) 160-4.5 MCG/ACT inhaler Inhale 2 puffs into the lungs daily as needed (Shortness of Breath).   . calcitRIOL (ROCALTROL) 0.25 MCG capsule Take 0.25 mcg by mouth daily.  . cetirizine (ZYRTEC) 10 MG tablet Take 10 mg by mouth daily as needed for allergies.   Marland Kitchen gabapentin (NEURONTIN) 100 MG capsule Take 1 capsule (100 mg total) by mouth 3 (three) times daily.  . hydrALAZINE (APRESOLINE) 10 MG tablet Take 1 tablet (10 mg total) by mouth 3 (three) times daily.  . Insulin Glargine (LANTUS SOLOSTAR) 100 UNIT/ML Solostar Pen Inject 50 Units into the skin at bedtime.  . iron polysaccharides (NIFEREX) 150 MG capsule Take 150 mg by mouth daily.  . mometasone (NASONEX) 50 MCG/ACT nasal spray Place 2 sprays into the nose daily as needed (allergies).   Marland Kitchen oxyCODONE-acetaminophen (ROXICET) 5-325 MG tablet Take 1 tablet by mouth every 6 (six) hours as needed for severe pain.  . pantoprazole (PROTONIX) 40 MG tablet Take 40 mg by mouth daily.  . simvastatin (ZOCOR) 20 MG tablet Take 20 mg by mouth every morning.   . TRADJENTA 5 MG TABS tablet TAKE 1 TABLET EVERY DAY   No facility-administered encounter medications on file as of 12/07/2015.   ALLERGIES: Allergies  Allergen Reactions  . Opana [Oxymorphone Hcl] Itching  . Tramadol Itching   VACCINATION STATUS: Immunization History  Administered Date(s) Administered  . Td 03/27/2011    Diabetes He presents for his follow-up diabetic visit. He has type 2 diabetes mellitus. Onset time: He was diagnosed approximately 20 years ago. His disease course  has been stable. There are no hypoglycemic associated symptoms. Pertinent negatives for hypoglycemia include no confusion, headaches, pallor or seizures. Associated symptoms include polyuria. Pertinent negatives for diabetes include no chest pain, no fatigue, no polydipsia, no polyphagia and no weakness. There are no hypoglycemic complications. Symptoms are stable. Diabetic complications include nephropathy. Risk factors for coronary artery disease include dyslipidemia, diabetes mellitus, hypertension, obesity, sedentary lifestyle, tobacco exposure and male sex. Current diabetic treatment includes insulin injections and oral agent (monotherapy). He is compliant with treatment most of the time.  He is following a generally unhealthy diet. He has had a previous visit with a dietitian. He rarely participates in exercise. His breakfast blood glucose range is generally 140-180 mg/dl. Eye exam is current.  Hypertension This is a chronic problem. The current episode started more than 1 year ago. The problem is controlled. Pertinent negatives include no chest pain, headaches, neck pain, palpitations or shortness of breath. Risk factors for coronary artery disease include diabetes mellitus, dyslipidemia, obesity, smoking/tobacco exposure and sedentary lifestyle. Past treatments include angiotensin blockers. Hypertensive end-organ damage includes kidney disease.  Hyperlipidemia This is a chronic problem. The current episode started more than 1 year ago. Pertinent negatives include no chest pain, myalgias or shortness of breath. Current antihyperlipidemic treatment includes statins. Risk factors for coronary artery disease include diabetes mellitus, dyslipidemia, hypertension, male sex and a sedentary lifestyle.     Review of Systems  Constitutional: Negative for fatigue and unexpected weight change.  HENT: Negative for dental problem, mouth sores and trouble swallowing.   Eyes: Negative for visual disturbance.   Respiratory: Negative for cough, choking, chest tightness, shortness of breath and wheezing.   Cardiovascular: Negative for chest pain, palpitations and leg swelling.  Gastrointestinal: Negative for nausea, vomiting, abdominal pain, diarrhea, constipation and abdominal distention.  Endocrine: Positive for polyuria. Negative for polydipsia and polyphagia.  Genitourinary: Negative for dysuria, urgency, hematuria and flank pain.  Musculoskeletal: Negative for myalgias, back pain, gait problem and neck pain.  Skin: Negative for pallor, rash and wound.  Neurological: Negative for seizures, syncope, weakness, numbness and headaches.  Psychiatric/Behavioral: Negative.  Negative for confusion and dysphoric mood.    Objective:    BP 137/84 mmHg  Pulse 94  Ht 5\' 4"  (1.626 m)  Wt 245 lb (111.131 kg)  BMI 42.03 kg/m2  SpO2 95%  Wt Readings from Last 3 Encounters:  12/07/15 245 lb (111.131 kg)  12/06/15 244 lb (110.678 kg)  11/02/15 244 lb 9.6 oz (110.95 kg)    Physical Exam  Constitutional: He is oriented to person, place, and time. He appears well-developed and well-nourished. He is cooperative. No distress.  HENT:  Head: Normocephalic and atraumatic.  Eyes: EOM are normal.  Neck: Normal range of motion. Neck supple. No tracheal deviation present. No thyromegaly present.  Cardiovascular: Normal rate, S1 normal, S2 normal and normal heart sounds.  Exam reveals no gallop.   No murmur heard. Pulses:      Dorsalis pedis pulses are 1+ on the right side, and 1+ on the left side.       Posterior tibial pulses are 1+ on the right side, and 1+ on the left side.  Pulmonary/Chest: Breath sounds normal. No respiratory distress. He has no wheezes.  Abdominal: Soft. Bowel sounds are normal. He exhibits no distension. There is no tenderness. There is no guarding and no CVA tenderness.  Musculoskeletal: He exhibits no edema.       Right shoulder: He exhibits no swelling and no deformity.  Neurological:  He is alert and oriented to person, place, and time. He has normal strength and normal reflexes. No cranial nerve deficit or sensory deficit. Gait normal.  Skin: Skin is warm and dry. No rash noted. No cyanosis. Nails show no clubbing.  Psychiatric: He has a normal mood and affect. His speech is normal and behavior is normal. Judgment and thought content normal. Cognition and memory are normal.    Results for orders placed or performed in visit on [id number]  Basic metabolic panel  Result Value Ref  Range   Sodium 144 135 - 146 mmol/L   Potassium 4.1 3.5 - 5.3 mmol/L   Chloride 105 98 - 110 mmol/L   CO2 30 20 - 31 mmol/L   Glucose, Bld 130 (H) 65 - 99 mg/dL   BUN 38 (H) 7 - 25 mg/dL   Creat 4.04 (H) 0.70 - 1.25 mg/dL   Calcium 9.2 8.6 - 10.3 mg/dL  Hemoglobin A1c  Result Value Ref Range   Hgb A1c MFr Bld 7.4 (H) <5.7 %   Mean Plasma Glucose 166 mg/dL   Diabetic Labs (most recent): Lab Results  Component Value Date   HGBA1C 7.4* 12/03/2015   HGBA1C 7.1* 09/04/2015   HGBA1C 7.1* 08/11/2015   Lipid Panel     Component Value Date/Time   CHOL 129 05/14/2015 0855   TRIG 108 05/14/2015 0855   HDL 35* 05/14/2015 0855   CHOLHDL 3.7 05/14/2015 0855   VLDL 22 05/14/2015 0855   LDLCALC 72 05/14/2015 0855     Assessment & Plan:   1. Type 2 diabetes mellitus with stage 4 chronic kidney disease, with long-term current use of insulin (HCC)  -His  Diabetes is complicated by chronic kidney disease and patient remains at a high risk for more acute and chronic complications of diabetes which include CAD, CVA, CKD, retinopathy, and neuropathy. These are all discussed in detail with the patient.  Patient came with controlled fasting glucose profile, and  recent A1c of 7.4% unchanged from last visit.  Glucose logs and insulin administration records pertaining to this visit,  to be scanned into patient's records.  Recent labs reviewed.   - I have re-counseled the patient on diet management and  weight loss  by adopting a carbohydrate restricted / protein rich  Diet.  - Suggestion is made for patient to avoid simple carbohydrates   from their diet including Cakes , Desserts, Ice Cream,  Soda (  diet and regular) , Sweet Tea , Candies,  Chips, Cookies, Artificial Sweeteners,   and "Sugar-free" Products .  This will help patient to have stable blood glucose profile and potentially avoid unintended  Weight gain.  - Patient is advised to stick to a routine mealtimes to eat 3 meals  a day and avoid unnecessary snacks ( to snack only to correct hypoglycemia). I have also advised him to cut back on alcohol consumption.  - The patient  has been  scheduled with Jearld Fenton, RDN, CDE for individualized DM education.  - I have approached patient with the following individualized plan to manage diabetes and patient agrees.  - I re-counseled him to stay focused to maintain control his DM.  -He is advised to stay away from ETOH and smoking. He will continue to need at least basal insulin therapy.  -I will continue Lantus  50 units qhs, off of Humalog . Continue tradjenta 5mg  po qday.  -He will be considered for prandial insulin if he continues to have high A1c or documented hyperglycemia.  He is following with Jearld Fenton, CDE for DM education.  He will be monitoring of BG BID.  -He will continue to f/u with Nephrology given stage IV CKD ( improving),  even after his DM is controlled.  -Patient is not a candidate for metformin and SGLT2 inhibitors due to CKD. His creatinine increasing to 4, I advised him to stay in close follow-up with nephrology.  - Patient specific target  for A1c; LDL, HDL, Triglycerides, and  Waist Circumference were discussed in detail.  2) BP/HTN: Controlled. Continue current medications including ACEI/ARB. 3) Lipids/HPL:  continue statins. 4)  Weight/Diet: CDE consult in progress, exercise, and carbohydrates information provided. 5) Vitamin D deficiency -I advised  him to continue vitamin D 50,000 units weekly  6) Chronic Care/Health Maintenance:  -Patient is on ACEI/ARB and Statin medications and encouraged to continue to follow up with Ophthalmology, Podiatrist at least yearly or according to recommendations, and advised to quit smoking. I have recommended yearly flu vaccine and pneumonia vaccination at least every 5 years; moderate intensity exercise for up to 150 minutes weekly; and  sleep for at least 7 hours a day.  - 25 minutes of time was spent on the care of this patient , 50% of which was applied for counseling on diabetes complications and their preventions.  - I advised patient to maintain close follow up with their PCP for primary care needs. - Patient is asked to bring meter and  blood glucose logs during their next visit.   Follow up plan: -Return in about 3 months (around 03/08/2016) for follow up with pre-visit labs, meter, and logs.  Glade Lloyd, MD Phone: [phone]  Fax: [phone]   12/07/2015, 10:02 AM

## 2015-12-09 DIAGNOSIS — N183 Chronic kidney disease, stage 3 (moderate): Secondary | ICD-10-CM | POA: Diagnosis not present

## 2015-12-09 DIAGNOSIS — D519 Vitamin B12 deficiency anemia, unspecified: Secondary | ICD-10-CM | POA: Diagnosis not present

## 2015-12-09 DIAGNOSIS — I10 Essential (primary) hypertension: Secondary | ICD-10-CM | POA: Diagnosis not present

## 2015-12-09 DIAGNOSIS — R809 Proteinuria, unspecified: Secondary | ICD-10-CM | POA: Diagnosis not present

## 2015-12-09 DIAGNOSIS — Z79899 Other long term (current) drug therapy: Secondary | ICD-10-CM | POA: Diagnosis not present

## 2015-12-09 DIAGNOSIS — E559 Vitamin D deficiency, unspecified: Secondary | ICD-10-CM | POA: Diagnosis not present

## 2015-12-15 DIAGNOSIS — N184 Chronic kidney disease, stage 4 (severe): Secondary | ICD-10-CM | POA: Diagnosis not present

## 2015-12-15 DIAGNOSIS — D291 Benign neoplasm of prostate: Secondary | ICD-10-CM | POA: Diagnosis not present

## 2015-12-15 DIAGNOSIS — E119 Type 2 diabetes mellitus without complications: Secondary | ICD-10-CM | POA: Diagnosis not present

## 2015-12-15 DIAGNOSIS — Z0001 Encounter for general adult medical examination with abnormal findings: Secondary | ICD-10-CM | POA: Diagnosis not present

## 2015-12-15 DIAGNOSIS — E1165 Type 2 diabetes mellitus with hyperglycemia: Secondary | ICD-10-CM | POA: Diagnosis not present

## 2015-12-15 DIAGNOSIS — R809 Proteinuria, unspecified: Secondary | ICD-10-CM | POA: Diagnosis not present

## 2015-12-15 DIAGNOSIS — E1129 Type 2 diabetes mellitus with other diabetic kidney complication: Secondary | ICD-10-CM | POA: Diagnosis not present

## 2015-12-15 DIAGNOSIS — N183 Chronic kidney disease, stage 3 (moderate): Secondary | ICD-10-CM | POA: Diagnosis not present

## 2015-12-15 DIAGNOSIS — Z0000 Encounter for general adult medical examination without abnormal findings: Secondary | ICD-10-CM | POA: Diagnosis not present

## 2015-12-15 DIAGNOSIS — C61 Malignant neoplasm of prostate: Secondary | ICD-10-CM | POA: Diagnosis not present

## 2015-12-15 DIAGNOSIS — I10 Essential (primary) hypertension: Secondary | ICD-10-CM | POA: Diagnosis not present

## 2015-12-20 ENCOUNTER — Emergency Department (HOSPITAL_COMMUNITY)
Admission: EM | Admit: 2015-12-20 | Discharge: 2015-12-20 | Disposition: A | Discharge status: Home / Self Care | Payer: Medicare Other | Attending: Emergency Medicine | Admitting: Emergency Medicine

## 2015-12-20 ENCOUNTER — Emergency Department (HOSPITAL_COMMUNITY): Payer: Medicare Other

## 2015-12-20 ENCOUNTER — Encounter (HOSPITAL_COMMUNITY): Payer: Self-pay | Admitting: Emergency Medicine

## 2015-12-20 DIAGNOSIS — E785 Hyperlipidemia, unspecified: Secondary | ICD-10-CM | POA: Diagnosis not present

## 2015-12-20 DIAGNOSIS — Y939 Activity, unspecified: Secondary | ICD-10-CM | POA: Insufficient documentation

## 2015-12-20 DIAGNOSIS — S93602A Unspecified sprain of left foot, initial encounter: Secondary | ICD-10-CM | POA: Diagnosis not present

## 2015-12-20 DIAGNOSIS — M1990 Unspecified osteoarthritis, unspecified site: Secondary | ICD-10-CM | POA: Diagnosis not present

## 2015-12-20 DIAGNOSIS — Z794 Long term (current) use of insulin: Secondary | ICD-10-CM | POA: Diagnosis not present

## 2015-12-20 DIAGNOSIS — M79672 Pain in left foot: Secondary | ICD-10-CM | POA: Diagnosis not present

## 2015-12-20 DIAGNOSIS — Z87891 Personal history of nicotine dependence: Secondary | ICD-10-CM | POA: Insufficient documentation

## 2015-12-20 DIAGNOSIS — I129 Hypertensive chronic kidney disease with stage 1 through stage 4 chronic kidney disease, or unspecified chronic kidney disease: Secondary | ICD-10-CM | POA: Diagnosis not present

## 2015-12-20 DIAGNOSIS — Y92096 Garden or yard of other non-institutional residence as the place of occurrence of the external cause: Secondary | ICD-10-CM | POA: Insufficient documentation

## 2015-12-20 DIAGNOSIS — J449 Chronic obstructive pulmonary disease, unspecified: Secondary | ICD-10-CM | POA: Diagnosis not present

## 2015-12-20 DIAGNOSIS — Y999 Unspecified external cause status: Secondary | ICD-10-CM | POA: Diagnosis not present

## 2015-12-20 DIAGNOSIS — I2510 Atherosclerotic heart disease of native coronary artery without angina pectoris: Secondary | ICD-10-CM | POA: Insufficient documentation

## 2015-12-20 DIAGNOSIS — Z6841 Body Mass Index (BMI) 40.0 and over, adult: Secondary | ICD-10-CM | POA: Diagnosis not present

## 2015-12-20 DIAGNOSIS — W1842XA Slipping, tripping and stumbling without falling due to stepping into hole or opening, initial encounter: Secondary | ICD-10-CM | POA: Diagnosis not present

## 2015-12-20 DIAGNOSIS — N184 Chronic kidney disease, stage 4 (severe): Secondary | ICD-10-CM | POA: Diagnosis not present

## 2015-12-20 DIAGNOSIS — Z79899 Other long term (current) drug therapy: Secondary | ICD-10-CM | POA: Insufficient documentation

## 2015-12-20 DIAGNOSIS — S99922A Unspecified injury of left foot, initial encounter: Secondary | ICD-10-CM | POA: Diagnosis present

## 2015-12-20 MED ORDER — HYDROCODONE-ACETAMINOPHEN 5-325 MG PO TABS: ORAL_TABLET | ORAL | Status: DC

## 2015-12-20 MED ORDER — HYDROCODONE-ACETAMINOPHEN 5-325 MG PO TABS
1.0000 | ORAL_TABLET | Freq: Once | ORAL | Status: AC
  Administered 2015-12-20: 1 via ORAL
  Filled 2015-12-20: qty 1

## 2015-12-20 NOTE — ED Notes (Signed)
Pt c/o left lateral foot pain after stepping in a hole on Thursday.

## 2015-12-20 NOTE — ED Provider Notes (Signed)
History  By signing my name below, I, Bea Graff, attest that this documentation has been prepared under the direction and in the presence of Chellsea Beckers, PA-C. Electronically Signed: Bea Graff, ED Scribe. 12/20/2015. 3:19 PM.  Chief Complaint  Patient presents with  . Foot Pain   The history is provided by the patient and medical records. No language interpreter was used.    HPI Comments:  [name] [name] is a 65 y.o. morbidly obese male who presents to the Emergency Department complaining of a left foot injury that he sustained three days ago secondary to stepping in a hole in his yard. He reports turning his foot when he stepped in the hole. He has not taken anything for pain. Bearing weight increases the pain. He denies alleviating factors. He denies numbness, tingling or weakness of the left foot, ankle or leg, bruising, wounds, back pain or fall. He denies any other injuries.   Past Medical History  Diagnosis Date  . COPD (chronic obstructive pulmonary disease) ([place])   . Sinusitis   . PUD (peptic ulcer disease)     remote, reports f/u EGD about 8 years ago unremarkable   . GERD (gastroesophageal reflux disease)   . Hyperlipidemia   . Essential hypertension   . Post traumatic stress disorder   . Obesity     Truncal  . CAD (coronary artery disease)     a. 2014 MV: abnl w/ infap ischemia; b. 03/2013 Cath: aneurysmal bleb in the LAD w/ otw nonobs dzs-->Med Rx.  . Hepatomegaly     noted on noncontrast CT 2015  . Vitamin D deficiency   . Chronic back pain   . Chronic shoulder pain   . Chronic knee pain     a. 09/2015 s/p R TKA.  Marland Kitchen Chronic pain   . Obstructive sleep apnea     cpap - settings at 4   . Arthritis   . On home oxygen therapy     uses 2l when is going somewhere per patient   . CKD (chronic kidney disease), stage IV ([place])     stage 4 per office visit note of Dr Lowanda Foster on 05/2015   . Chronic sinusitis   . Reactive airway disease     related to  exposure to chemical during 9/11  . Bell palsy    Past Surgical History  Procedure Laterality Date  . Asad lt shoulder  12/2008    left shoulder  . Umbilical hernia repair  2007    [place]  . Knee arthroscopy  10/2007    left  . Toenail excision      removed x2-bilateral  . Eye surgery  12/22/2010    tear duct probing-[place]  . Foreign body removal  03/29/2011    Procedure: REMOVAL FOREIGN BODY EXTREMITY;  Surgeon: Arther Abbott, MD;  Location: [facility];  Service: Orthopedics;  Laterality: Right;  Removal Foreign Body Right Thumb  . Quadriceps tendon repair  07/21/2011    Procedure: REPAIR QUADRICEP TENDON;  Surgeon: Arther Abbott, MD;  Location: [facility];  Service: Orthopedics;  Laterality: Right;  . Nm myoview ltd    . Doppler echocardiography    . Left heart catheterization with coronary angiogram N/A 03/28/2013    Procedure: LEFT HEART CATHETERIZATION WITH CORONARY ANGIOGRAM;  Surgeon: Leonie Man, MD;  Location: [facility] CATH LAB;  Service: Cardiovascular;  Laterality: N/A;  . Colonoscopy  10/2008    Fields: Rectal polyp obliterated, not retrieved, hemorrhoids, single ascending colon diverticulum near  the CV. Next colonoscopy April 2020  . Colonoscopy N/A 12/25/2014    SLF: 1. Colorectal polyps (2) removed 2. Small internal hemorrhoids 3. the left colon is severely redundant  . Esophagogastroduodenoscopy N/A 12/25/2014    SLF: 1. Anemia most likely due to CRI, gastritis, gastric polyps 2. Moderate non-erosive gastriits and mild duodenitis.  3.TWo large gstric polyps removed.   Marland Kitchen Penile prosthesis implant N/A 08/16/2015    Procedure: PENILE PROTHESIS INFLATABLE, three piece, Excisional biopsy of Penile ulcer, Penile molding;  Surgeon: Carolan Clines, MD;  Location: [facility];  Service: Urology;  Laterality: N/A;  . Knee arthroscopy with lateral menisectomy Right 10/14/2015    Procedure: LEFT KNEE ARTHROSCOPY WITH PARTIAL LATERAL MENISECTOMY;  Surgeon: Carole Civil, MD;  Location: [facility]  ORS;  Service: Orthopedics;  Laterality: Right;   Family History  Problem Relation Age of Onset  . Hypertension Mother     MI  . Cancer Mother     breast   . Diabetes Mother   . Diabetes Father   . Hypertension Father   . Hypertension Sister   . Diabetes Sister   . Arthritis    . Asthma    . Lung disease    . Anesthesia problems Neg Hx   . Hypotension Neg Hx   . Malignant hyperthermia Neg Hx   . Pseudochol deficiency Neg Hx   . Colon cancer Neg Hx    Social History  Substance Use Topics  . Smoking status: Former Smoker -- 1.00 packs/day for 25 years    Types: Cigarettes    Quit date: 03/27/2010  . Smokeless tobacco: Never Used     Comment: Quit x 7 years  . Alcohol Use: 0.0 oz/week    0 Standard drinks or equivalent per week     Comment: occasionally    Review of Systems  Constitutional: Negative for fever, chills and fatigue.  HENT: Negative for sore throat and trouble swallowing.   Respiratory: Negative for cough, shortness of breath and wheezing.   Cardiovascular: Negative for chest pain and palpitations.  Gastrointestinal: Negative for nausea, vomiting, abdominal pain and blood in stool.  Genitourinary: Negative for dysuria, hematuria and flank pain.  Musculoskeletal: Positive for arthralgias. Negative for myalgias, back pain, neck pain and neck stiffness.  Skin: Negative for color change, rash and wound.  Neurological: Negative for dizziness, weakness and numbness.  Hematological: Does not bruise/bleed easily.    Allergies  Opana and Tramadol  Home Medications   Prior to Admission medications   Medication Sig Start Date End Date Taking? Authorizing Provider  albuterol (PROVENTIL HFA) 108 (90 BASE) MCG/ACT inhaler Inhale 2 puffs into the lungs every 4 (four) hours as needed for wheezing.    Yes Historical Provider, MD  amLODipine (NORVASC) 10 MG tablet Take 10 mg by mouth daily.    Yes Historical Provider, MD  budesonide-formoterol (SYMBICORT) 160-4.5  MCG/ACT inhaler Inhale 2 puffs into the lungs daily as needed (Shortness of Breath).    Yes Historical Provider, MD  calcitRIOL (ROCALTROL) 0.25 MCG capsule Take 0.25 mcg by mouth daily.   Yes Historical Provider, MD  cetirizine (ZYRTEC) 10 MG tablet Take 10 mg by mouth daily as needed for allergies.    Yes Historical Provider, MD  gabapentin (NEURONTIN) 100 MG capsule Take 1 capsule (100 mg total) by mouth 3 (three) times daily. 12/06/15  Yes Carole Civil, MD  hydrALAZINE (APRESOLINE) 10 MG tablet Take 1 tablet (10 mg total) by mouth 3 (three) times daily.  11/08/15  Yes Rogelia Mire, NP  Insulin Glargine (LANTUS SOLOSTAR) 100 UNIT/ML Solostar Pen Inject 50 Units into the skin at bedtime. 08/25/15  Yes Cassandria Anger, MD  iron polysaccharides (NIFEREX) 150 MG capsule Take 150 mg by mouth daily.   Yes Historical Provider, MD  mometasone (NASONEX) 50 MCG/ACT nasal spray Place 2 sprays into the nose daily as needed (allergies).    Yes Historical Provider, MD  pantoprazole (PROTONIX) 40 MG tablet Take 40 mg by mouth daily.   Yes Historical Provider, MD  simvastatin (ZOCOR) 20 MG tablet Take 20 mg by mouth every morning.    Yes Historical Provider, MD  TRADJENTA 5 MG TABS tablet TAKE 1 TABLET EVERY DAY 10/06/15  Yes Cassandria Anger, MD   Triage Vitals: BP 145/94 mmHg  Pulse 86  Temp(Src) 98.6 F (37 C) (Oral)  Resp 17  Ht 5\' 4"  (1.626 m)  Wt 239 lb (108.41 kg)  BMI 41.00 kg/m2  SpO2 96% Physical Exam  Constitutional: He is oriented to person, place, and time. He appears well-developed and well-nourished.  HENT:  Head: Normocephalic and atraumatic.  Eyes: EOM are normal.  Neck: Normal range of motion.  Cardiovascular: Normal rate, regular rhythm and normal heart sounds.  Exam reveals no gallop and no friction rub.   No murmur heard. DP pulses of left foot intact.  Pulmonary/Chest: Effort normal and breath sounds normal. No respiratory distress. He has no wheezes. He has no  rales.  Musculoskeletal: Normal range of motion. He exhibits tenderness. He exhibits no edema.  Tenderness to lateral aspect of left foot. No edema or bruising. No proximal tenderness, compartments soft  Neurological: He is alert and oriented to person, place, and time.  Distal sensations intact of LLE.  Skin: Skin is warm and dry.  Psychiatric: He has a normal mood and affect. His behavior is normal.  Nursing note and vitals reviewed.   ED Course  Procedures (including critical care time) DIAGNOSTIC STUDIES: Oxygen Saturation is 96on RA, adequate by my interpretation.   COORDINATION OF CARE: 3:14 PM- Will order brace for the left foot. Encouraged pt to RICE left foot. Pt verbalizes understanding and agrees to plan.  Medications - No data to display  Labs Review Labs Reviewed - No data to display  Imaging Review Dg Foot Complete Left  12/20/2015  CLINICAL DATA:  Status post pain after injury.  Lateral foot pain. EXAM: LEFT FOOT - COMPLETE 3+ VIEW COMPARISON:  None. FINDINGS: There is no evidence of fracture or dislocation. There is mild osteoarthritis of the first MTP joint. There is mild osteoarthritis of the talonavicular joint. There are enthesopathic changes at the Achilles tendon insertion. Soft tissues are unremarkable. IMPRESSION: No acute osseous injury of the left foot. Electronically Signed   By: Kathreen Devoid   On: 12/20/2015 13:54   I have personally reviewed and evaluated these images and lab results as part of my medical decision-making.   EKG Interpretation None      MDM   Final diagnoses:  Foot sprain, left, initial encounter    Patient X-Ray negative for obvious fracture or dislocation.  Pt advised to follow up with orthopedics. Patient given ASO splint while in ED, conservative therapy recommended and discussed. Patient will be discharged home & is agreeable with above plan. Returns precautions discussed. Pt appears safe for discharge.  I personally performed  the services described in this documentation, which was scribed in my presence. The recorded information has been reviewed and  is accurate.     Kem Parkinson, PA-C 12/21/15 2125  Blanchie Dessert, MD 12/23/15 2316

## 2015-12-20 NOTE — Discharge Instructions (Signed)
Foot Sprain °A foot sprain is an injury to one of the strong bands of tissue (ligaments) that connect and support the many bones in your feet. The ligament can be stretched too much or it can tear. A tear can be either partial or complete. The severity of the sprain depends on how much of the ligament was damaged or torn. °CAUSES °A foot sprain is usually caused by suddenly twisting or pivoting your foot. °RISK FACTORS °This injury is more likely to occur in people who: °· Play a sport, such as basketball or football. °· Exercise or play a sport without warming up. °· Start a new workout or sport. °· Suddenly increase how long or hard they exercise or play a sport. °SYMPTOMS °Symptoms of this condition start soon after an injury and include: °· Pain, especially in the arch of the foot. °· Bruising. °· Swelling. °· Inability to walk or use the foot to support body weight. °DIAGNOSIS °This condition is diagnosed with a medical history and physical exam. You may also have imaging tests, such as: °· X-rays to make sure there are no broken bones (fractures). °· MRI to see if the ligament has torn. °TREATMENT °Treatment varies depending on the severity of your sprain. Mild sprains can be treated with rest, ice, compression, and elevation (RICE). If your ligament is overstretched or partially torn, treatment usually involves keeping your foot in a fixed position (immobilization) for a period of time. To help you do this, your health care provider will apply a bandage, splint, or walking boot to keep your foot from moving until it heals. You may also be advised to use crutches or a scooter for a few weeks to avoid bearing weight on your foot while it is healing. °If your ligament is fully torn, you may need surgery to reconnect the ligament to the bone. After surgery, a cast or splint will be applied and will need to stay on your foot while it heals. °Your health care provider may also suggest exercises or physical therapy  to strengthen your foot. °HOME CARE INSTRUCTIONS °If You Have a Bandage, Splint, or Walking Boot: °· Wear it as directed by your health care provider. Remove it only as directed by your health care provider. °· Loosen the bandage, splint, or walking boot if your toes become numb and tingle, or if they turn cold and blue. °Bathing °· If your health care provider approves bathing and showering, cover the bandage or splint with a watertight plastic bag to protect it from water. Do not let the bandage or splint get wet. °Managing Pain, Stiffness, and Swelling  °· If directed, apply ice to the injured area: °¨ Put ice in a plastic bag. °¨ Place a towel between your skin and the bag. °¨ Leave the ice on for 20 minutes, 2-3 times per day. °· Move your toes often to avoid stiffness and to lessen swelling. °· Raise (elevate) the injured area above the level of your heart while you are sitting or lying down. °Driving °· Do not drive or operate heavy machinery while taking pain medicine. °· Do not drive while wearing a bandage, splint, or walking boot on a foot that you use for driving. °Activity °· Rest as directed by your health care provider. °· Do not use the injured foot to support your body weight until your health care provider says that you can. Use crutches or other supportive devices as directed by your health care provider. °· Ask your health care   provider what activities are safe for you. Gradually increase how much and how far you walk until your health care provider says it is safe to return to full activity. °· Do any exercise or physical therapy as directed by your health care provider. °General Instructions °· If a splint was applied, do not put pressure on any part of it until it is fully hardened. This may take several hours. °· Take medicines only as directed by your health care provider. These include over-the-counter medicines and prescription medicines. °· Keep all follow-up visits as directed by your  health care provider. This is important. °· When you can walk without pain, wear supportive shoes that have stiff soles. Do not wear flip-flops, and do not walk barefoot. °SEEK MEDICAL CARE IF: °· Your pain is not controlled with medicine. °· Your bruising or swelling gets worse or does not get better with treatment. °· Your splint or walking boot is damaged. °SEEK IMMEDIATE MEDICAL CARE IF: °· Your foot is numb or blue. °· Your foot feels colder than normal. °  °This information is not intended to replace advice given to you by your health care provider. Make sure you discuss any questions you have with your health care provider. °  °Document Released: 12/23/2001 Document Revised: 11/17/2014 Document Reviewed: 05/06/2014 °Elsevier Interactive Patient Education ©2016 [employer] ° °

## 2015-12-21 DIAGNOSIS — R160 Hepatomegaly, not elsewhere classified: Secondary | ICD-10-CM | POA: Diagnosis not present

## 2015-12-22 LAB — HEPATIC FUNCTION PANEL
ALT: 12 U/L (ref 9–46)
AST: 15 U/L (ref 10–35)
Albumin: 3.7 g/dL (ref 3.6–5.1)
Alkaline Phosphatase: 40 U/L (ref 40–115)
Bilirubin, Direct: 0.1 mg/dL (ref <=0.2)
Indirect Bilirubin: 0.3 mg/dL (ref 0.2–1.2)
TOTAL PROTEIN: 6.4 g/dL (ref 6.1–8.1)
Total Bilirubin: 0.4 mg/dL (ref 0.2–1.2)

## 2015-12-24 ENCOUNTER — Telehealth: Payer: Self-pay | Admitting: Orthopedic Surgery

## 2015-12-24 NOTE — Telephone Encounter (Signed)
Mr. Berends called asking about being referred to a pain management doctor in [place]. He said that now that Dr. Francesco Runner is no longer seeing patients for pain management, he had spoken with his attorney who recommended Dr. Julianne Rice in [place]. His number is [phone]. He is asking for Dr. Aline Brochure to refer him there.  Please call and advise.

## 2015-12-30 ENCOUNTER — Encounter: Payer: Self-pay | Admitting: Gastroenterology

## 2015-12-30 ENCOUNTER — Ambulatory Visit (INDEPENDENT_AMBULATORY_CARE_PROVIDER_SITE_OTHER): Payer: Medicare Other | Admitting: Neurology

## 2015-12-30 ENCOUNTER — Ambulatory Visit (INDEPENDENT_AMBULATORY_CARE_PROVIDER_SITE_OTHER): Payer: Medicare Other | Admitting: Gastroenterology

## 2015-12-30 ENCOUNTER — Ambulatory Visit (INDEPENDENT_AMBULATORY_CARE_PROVIDER_SITE_OTHER): Payer: Self-pay | Admitting: Neurology

## 2015-12-30 ENCOUNTER — Other Ambulatory Visit: Payer: Self-pay | Admitting: *Deleted

## 2015-12-30 VITALS — BP 150/93 | HR 77 | Temp 98.8°F | Ht 64.0 in | Wt 237.0 lb

## 2015-12-30 DIAGNOSIS — K640 First degree hemorrhoids: Secondary | ICD-10-CM

## 2015-12-30 DIAGNOSIS — K760 Fatty (change of) liver, not elsewhere classified: Secondary | ICD-10-CM | POA: Diagnosis not present

## 2015-12-30 DIAGNOSIS — G5603 Carpal tunnel syndrome, bilateral upper limbs: Secondary | ICD-10-CM | POA: Diagnosis not present

## 2015-12-30 DIAGNOSIS — G894 Chronic pain syndrome: Secondary | ICD-10-CM

## 2015-12-30 DIAGNOSIS — R200 Anesthesia of skin: Secondary | ICD-10-CM

## 2015-12-30 DIAGNOSIS — I2510 Atherosclerotic heart disease of native coronary artery without angina pectoris: Secondary | ICD-10-CM | POA: Diagnosis not present

## 2015-12-30 DIAGNOSIS — N189 Chronic kidney disease, unspecified: Secondary | ICD-10-CM | POA: Diagnosis not present

## 2015-12-30 DIAGNOSIS — K219 Gastro-esophageal reflux disease without esophagitis: Secondary | ICD-10-CM | POA: Diagnosis not present

## 2015-12-30 DIAGNOSIS — Z0189 Encounter for other specified special examinations: Secondary | ICD-10-CM

## 2015-12-30 DIAGNOSIS — D631 Anemia in chronic kidney disease: Secondary | ICD-10-CM

## 2015-12-30 MED ORDER — PANTOPRAZOLE SODIUM 40 MG PO TBEC: DELAYED_RELEASE_TABLET | ORAL | Status: DC

## 2015-12-30 NOTE — Progress Notes (Signed)
CC'ED TO PCP 

## 2015-12-30 NOTE — Assessment & Plan Note (Signed)
SYMPTOMS NOT IDEALLY CONTROLLED DUE TO BREAKTHROUGH SYMPTOMS 3-4 TIMES A WEEK.  AVOID TRIGGERS FOR REFLUX.  HANDOUT GIVEN. INCREASE PROTONIX. TAKE 30 MINUTES PRIOR TO MEALS TWICE DAILY. CONTINUE  WEIGHT LOSS EFFORTS. LOSE 20 POUNDS. CONSIDER WATER AEROBIC OR STATIONARY BIKE. I RECOMMEND 30-40 MINS OF EXERCISE 3 TO 5 TIMES A WEEK. FOLLOW UP IN 6 MOS.

## 2015-12-30 NOTE — Progress Notes (Signed)
Subjective:    Patient ID: [name] [name], male    DOB: Feb 09, 1951, 65 y.o.   MRN: [id number]  [name],TESFAYE, MD  HPI HEARTBURN NOT CONTROLLED ON PROTONIX QD. BOWELS GOOD. APPETITE: GOOD. WEIGHT DOWN 1 LB. OCCASIONAL SOB BUT NOT THAT BAD. STAYS TIRED ALL THE TIME. TAKES IRON MOST DAYS. PT DENIES FEVER, CHILLS, HEMATOCHEZIA, HEMATEMESIS, nausea, vomiting, melena, diarrhea, CHEST PAIN, CHANGE IN BOWEL IN HABITS, constipation, abdominal pain, OR problems swallowing.   Past Medical History  Diagnosis Date  . COPD (chronic obstructive pulmonary disease) ([place])   . Sinusitis   . PUD (peptic ulcer disease)     remote, reports f/u EGD about 8 years ago unremarkable   . GERD (gastroesophageal reflux disease)   . Hyperlipidemia   . Essential hypertension   . Post traumatic stress disorder   . Obesity     Truncal  . CAD (coronary artery disease)     a. 2014 MV: abnl w/ infap ischemia; b. 03/2013 Cath: aneurysmal bleb in the LAD w/ otw nonobs dzs-->Med Rx.  . Hepatomegaly     noted on noncontrast CT 2015  . Vitamin D deficiency   . Chronic back pain   . Chronic shoulder pain   . Chronic knee pain     a. 09/2015 s/p R TKA.  Marland Kitchen Chronic pain   . Obstructive sleep apnea     cpap - settings at 4   . Arthritis   . On home oxygen therapy     uses 2l when is going somewhere per patient   . CKD (chronic kidney disease), stage IV ([place])     stage 4 per office visit note of Dr Lowanda Foster on 05/2015   . Chronic sinusitis   . Reactive airway disease     related to exposure to chemical during 9/11  . Bell palsy     Past Surgical History  Procedure Laterality Date  . Asad lt shoulder  12/2008    left shoulder  . Umbilical hernia repair  2007    [place]  . Knee arthroscopy  10/2007    left  . Toenail excision      removed x2-bilateral  . Eye surgery  12/22/2010    tear duct probing-[place]  . Foreign body removal  03/29/2011    Procedure: REMOVAL FOREIGN BODY EXTREMITY;  Surgeon: Arther Abbott, MD;  Location: [facility];  Service: Orthopedics;  Laterality: Right;  Removal Foreign Body Right Thumb  . Quadriceps tendon repair  07/21/2011    Procedure: REPAIR QUADRICEP TENDON;  Surgeon: Arther Abbott, MD;  Location: [facility];  Service: Orthopedics;  Laterality: Right;  . Nm myoview ltd    . Doppler echocardiography    . Left heart catheterization with coronary angiogram N/A 03/28/2013    Procedure: LEFT HEART CATHETERIZATION WITH CORONARY ANGIOGRAM;  Surgeon: Leonie Man, MD;  Location: [facility] CATH LAB;  Service: Cardiovascular;  Laterality: N/A;  . Colonoscopy  10/2008    [place]: Rectal polyp obliterated, not retrieved, hemorrhoids, single ascending colon diverticulum near the CV. Next colonoscopy April 2020  . Colonoscopy N/A 12/25/2014    SLF: 1. Colorectal polyps (2) removed 2. Small internal hemorrhoids 3. the left colon is severely redundant  . Esophagogastroduodenoscopy N/A 12/25/2014    SLF: 1. Anemia most likely due to CRI, gastritis, gastric polyps 2. Moderate non-erosive gastriits and mild duodenitis.  3.TWo large gstric polyps removed.   Marland Kitchen Penile prosthesis implant N/A 08/16/2015    Procedure: PENILE PROTHESIS INFLATABLE, three  piece, Excisional biopsy of Penile ulcer, Penile molding;  Surgeon: Carolan Clines, MD;  Location: [facility];  Service: Urology;  Laterality: N/A;  . Knee arthroscopy with lateral menisectomy Right 10/14/2015    Procedure: LEFT KNEE ARTHROSCOPY WITH PARTIAL LATERAL MENISECTOMY;  Surgeon: Carole Civil, MD;  Location: [facility];  Service: Orthopedics;  Laterality: Right;    Allergies  Allergen Reactions  . Opana [Oxymorphone Hcl] Itching  . Tramadol Itching    Current Outpatient Prescriptions  Medication Sig Dispense Refill  . PROVENTIL HFA 108 (90 BASE) MCG/ACT inhaler Inhale 2 puffs into the lungs every 4 (four) hours as needed for wheezing.     Marland Kitchen amLODipine (NORVASC) 10 MG tablet Take 10 mg by mouth daily.     Dellis Anes) 160-4.5 MCG/ACT  inhaler Inhale 2 puffs into the lungs daily as needed (Shortness of Breath).     . calcitRIOL (ROCALTROL) 0.25 MCG capsule Take 0.25 mcg by mouth daily. Takes 2 daily    . cetirizine (ZYRTEC) 10 MG tablet Take 10 mg by mouth daily as needed for allergies.     Marland Kitchen clopidogrel (PLAVIX) 75 MG tablet Take 75 mg by mouth daily.    Marland Kitchen gabapentin (NEURONTIN) 100 MG capsule Take 1 capsule (100 mg total) by mouth 3 (three) times daily.    . hydrALAZINE (APRESOLINE) 10 MG tablet Take 1 tablet (10 mg total) by mouth 3 (three) times daily.    Marland Kitchen LANTUS SOLOSTAR 100 UNIT/ML Solostar Pen Inject 50 Units into the skin at bedtime.    . iron polysaccharides (NIFEREX) 150 MG capsule Take 150 mg by mouth daily.    . mometasone (NASONEX) 50 MCG/ACT nasal spray Place 2 sprays into the nose daily as needed (allergies).     Marland Kitchen PROTONIX 40 MG tablet Take 40 mg by mouth daily.    . simvastatin (ZOCOR) 20 MG tablet Take 20 mg by mouth every morning.     . TRADJENTA 5 MG TABS tablet TAKE 1 TABLET EVERY DAY    .       Review of Systems PER HPI OTHERWISE ALL SYSTEMS ARE NEGATIVE.    Objective:   Physical Exam  Constitutional: He is oriented to person, place, and time. He appears well-developed and well-nourished. No distress.  HENT:  Head: Normocephalic and atraumatic.  Mouth/Throat: Oropharynx is clear and moist. No oropharyngeal exudate.  Eyes: Pupils are equal, round, and reactive to light. No scleral icterus.  Neck: Normal range of motion. Neck supple.  Cardiovascular: Normal rate, regular rhythm and normal heart sounds.   Pulmonary/Chest: Effort normal and breath sounds normal. No respiratory distress.  Abdominal: Soft. Bowel sounds are normal. He exhibits no distension. There is no tenderness.  Musculoskeletal: He exhibits no edema.  Lymphadenopathy:    He has no cervical adenopathy.  Neurological: He is alert and oriented to person, place, and time.  NO FOCAL DEFICITS  Psychiatric: He has a normal mood and  affect.  Vitals reviewed.     Assessment & Plan:

## 2015-12-30 NOTE — Assessment & Plan Note (Signed)
SYMPTOMS CONTROLLED/RESOLVED.  CONTINUE TO MONITOR SYMPTOMS. 

## 2015-12-30 NOTE — Assessment & Plan Note (Signed)
WEIGHT DOWN 1 LB, BMI > 40, & NL LIVER ENZYMES .OCCASIONAL BEER.  DRINK LITE BEER. CONTINUE YOUR WEIGHT LOSS EFFORTS. LOSE 20 POUNDS. CONSIDER WATER AEROBIC OR STATIONARY BIKE. RECOMMENDED 30-40 MINS OF EXERCISE 3 TO 5 TIMES A WEEK. FOLLOW UP IN 6 MOS.

## 2015-12-30 NOTE — Assessment & Plan Note (Signed)
HB STABLE. NO BRBPR OR MELENA.  CONTINUE TO MONITOR SYMPTOMS.

## 2015-12-30 NOTE — Progress Notes (Signed)
ON RECALL  °

## 2015-12-30 NOTE — Patient Instructions (Signed)
AVOID TRIGGERS FOR REFLUX. SEE INFO BELOW.  INCREASE PROTONIX. TAKE 30 MINUTES PRIOR TO MEALS TWICE DAILY.  CONTINUE YOUR WEIGHT LOSS EFFORTS. LOSE 20 POUNDS.  CONSIDER WATER AEROBIC OR STATIONARY BIKE. I RECOMMEND 30-40 MINS OF EXERCISE 3 TO 5 TIMES A WEEK.  FOLLOW UP IN 6 MOS.        Lifestyle and home remedies TO MANAGE REFLUX/CHEST PAIN  You may eliminate or reduce the frequency of heartburn by making the following lifestyle changes:  . Control your weight. Being overweight is a major risk factor for heartburn and GERD. Excess pounds put pressure on your abdomen, pushing up your stomach and causing acid to back up into your esophagus.   . Eat smaller meals. 4 TO 6 MEALS A DAY. This reduces pressure on the lower esophageal sphincter, helping to prevent the valve from opening and acid from washing back into your esophagus.   [name] [name] your belt. Clothes that fit tightly around your waist put pressure on your abdomen and the lower esophageal sphincter.   . Eliminate heartburn triggers. Everyone has specific triggers. Common triggers such as fatty or fried foods, spicy food, tomato sauce, carbonated beverages, alcohol, chocolate, mint, garlic, onion, caffeine and nicotine may make heartburn worse.   Marland Kitchen Avoid stooping or bending. Tying your shoes is OK. Bending over for longer periods to weed your garden isn't, especially soon after eating.   . Don't lie down after a meal. Wait at least three to four hours after eating before going to bed, and don't lie down right after eating.   Marland Kitchen PUT THE HEAD OF YOUR BED ON 6 INCH BLOCKS.   Alternative medicine . Several home remedies exist for treating GERD, but they provide only temporary relief. They include drinking baking soda (sodium bicarbonate) added to water or drinking other fluids such as baking soda mixed with cream of tartar and water.  . Although these liquids create temporary relief by neutralizing, washing away or buffering  acids, eventually they aggravate the situation by adding gas and fluid to your stomach, increasing pressure and causing more acid reflux. Further, adding more sodium to your diet may increase your blood pressure and add stress to your heart, and excessive bicarbonate ingestion can alter the acid-base balance in your body.      Low-Fat Diet BREADS, CEREALS, PASTA, RICE, DRIED PEAS, AND BEANS These products are high in carbohydrates and most are low in fat. Therefore, they can be increased in the diet as substitutes for fatty foods. They too, however, contain calories and should not be eaten in excess. Cereals can be eaten for snacks as well as for breakfast.   FRUITS AND VEGETABLES It is good to eat fruits and vegetables. Besides being sources of fiber, both are rich in vitamins and some minerals. They help you get the daily allowances of these nutrients. Fruits and vegetables can be used for snacks and desserts.  MEATS Limit lean meat, chicken, [place], and fish to no more than 6 ounces per day. Beef, Pork, and Lamb Use lean cuts of beef, pork, and lamb. Lean cuts include:  Extra-lean ground beef.  Arm roast.  Sirloin tip.  Center-cut ham.  Round steak.  Loin chops.  Rump roast.  Tenderloin.  Trim all fat off the outside of meats before cooking. It is not necessary to severely decrease the intake of red meat, but lean choices should be made. Lean meat is rich in protein and contains a highly absorbable form of iron. Premenopausal women,  in particular, should avoid reducing lean red meat because this could increase the risk for low red blood cells (iron-deficiency anemia).  Chicken and [place] These are good sources of protein. The fat of poultry can be reduced by removing the skin and underlying fat layers before cooking. Chicken and [place] can be substituted for lean red meat in the diet. Poultry should not be fried or covered with high-fat sauces. Fish and Shellfish Fish is a good  source of protein. Shellfish contain cholesterol, but they usually are low in saturated fatty acids. The preparation of fish is important. Like chicken and [place], they should not be fried or covered with high-fat sauces. EGGS Egg whites contain no fat or cholesterol. They can be eaten often. Try 1 to 2 egg whites instead of whole eggs in recipes or use egg substitutes that do not contain yolk. MILK AND DAIRY PRODUCTS Use skim or 1% milk instead of 2% or whole milk. Decrease whole milk, natural, and processed cheeses. Use nonfat or low-fat (2%) cottage cheese or low-fat cheeses made from vegetable oils. Choose nonfat or low-fat (1 to 2%) yogurt. Experiment with evaporated skim milk in recipes that call for heavy cream. Substitute low-fat yogurt or low-fat cottage cheese for sour cream in dips and salad dressings. Have at least 2 servings of low-fat dairy products, such as 2 glasses of skim (or 1%) milk each day to help get your daily calcium intake. FATS AND OILS Reduce the total intake of fats, especially saturated fat. Butterfat, lard, and beef fats are high in saturated fat and cholesterol. These should be avoided as much as possible. Vegetable fats do not contain cholesterol, but certain vegetable fats, such as coconut oil, palm oil, and palm kernel oil are very high in saturated fats. These should be limited. These fats are often used in bakery goods, processed foods, popcorn, oils, and nondairy creamers. Vegetable shortenings and some peanut butters contain hydrogenated oils, which are also saturated fats. Read the labels on these foods and check for saturated vegetable oils. Unsaturated vegetable oils and fats do not raise blood cholesterol. However, they should be limited because they are fats and are high in calories. Total fat should still be limited to 30% of your daily caloric intake. Desirable liquid vegetable oils are corn oil, cottonseed oil, olive oil, canola oil, safflower oil, soybean oil,  and sunflower oil. Peanut oil is not as good, but small amounts are acceptable. Buy a heart-healthy tub margarine that has no partially hydrogenated oils in the ingredients. Mayonnaise and salad dressings often are made from unsaturated fats, but they should also be limited because of their high calorie and fat content. Seeds, nuts, peanut butter, olives, and avocados are high in fat, but the fat is mainly the unsaturated type. These foods should be limited mainly to avoid excess calories and fat. OTHER EATING TIPS Snacks  Most sweets should be limited as snacks. They tend to be rich in calories and fats, and their caloric content outweighs their nutritional value. Some good choices in snacks are graham crackers, melba toast, soda crackers, bagels (no egg), English muffins, fruits, and vegetables. These snacks are preferable to snack crackers, [place] fries, TORTILLA CHIPS, and POTATO chips. Popcorn should be air-popped or cooked in small amounts of liquid vegetable oil. Desserts Eat fruit, low-fat yogurt, and fruit ices instead of pastries, cake, and cookies. Sherbet, angel food cake, gelatin dessert, frozen low-fat yogurt, or other frozen products that do not contain saturated fat (pure fruit juice bars,  frozen ice pops) are also acceptable.  COOKING METHODS Choose those methods that use little or no fat. They include: Poaching.  Braising.  Steaming.  Grilling.  Baking.  Stir-frying.  Broiling.  Microwaving.  Foods can be cooked in a nonstick pan without added fat, or use a nonfat cooking spray in regular cookware. Limit fried foods and avoid frying in saturated fat. Add moisture to lean meats by using water, broth, cooking wines, and other nonfat or low-fat sauces along with the cooking methods mentioned above. Soups and stews should be chilled after cooking. The fat that forms on top after a few hours in the refrigerator should be skimmed off. When preparing meals, avoid using excess salt. Salt  can contribute to raising blood pressure in some people.  EATING AWAY FROM HOME Order entres, potatoes, and vegetables without sauces or butter. When meat exceeds the size of a deck of cards (3 to 4 ounces), the rest can be taken home for another meal. Choose vegetable or fruit salads and ask for low-calorie salad dressings to be served on the side. Use dressings sparingly. Limit high-fat toppings, such as bacon, crumbled eggs, cheese, sunflower seeds, and olives. Ask for heart-healthy tub margarine instead of butter.

## 2015-12-30 NOTE — Progress Notes (Signed)
  [facility]    Provider:  Dr Jaynee Eagles Referring Provider: Arther Abbott, MD Primary Care Physician:  [name], MD   History:  [name] [name] is a 65 y.o. male here as a referral from Dr. Legrand Rams for numbness of right arm.  Right hand is going numb. The numbness radiates from the neck to the right hand with numbness.  Summary:   Nerve Conduction Studies were performed on the bilateral upper extremities.  The right median APB motor nerve showed prolonged distal onset latency (5.0 ms, N<4.0). The right Median 2nd Digit sensory nerve showed prolonged distal peak latency (5.3 ms, N<3.9). F Wave studies indicate that the right Median F wave has normal latency.  The left median APB motor nerve showed prolonged distal onset latency (8.1 ms, N<4.0). The left Median 2nd Digit sensory nerve showed No response. F Wave studies indicate that the left Median F wave has delayed latency(38 ms, N<34).  Bilateral Ulnar ADM motor nerves were within normal limits. F Wave studies indicate that the bilateral Ulnar F waves have normal latencies The bilateralUlnar 5th digit sensory nerves were within normal limits.  EMG needle study of selected bilateral upper extremity muscles was performed. The left Opponens Pollicis showed increased motor unit amplitude,diminished motor unit recruitment and prolonged motor unit duration. The right Opponens Pollicis showed diminished motor unit recruitment.  The following muscles were normal: Deltoid, Triceps, Pronator Teres, First Dorsal Interosseous, lower cervical paraspinal muscles bilaterally.  Conclusion: There is electrophysiologic evidence of bilateral moderately-severe left > right Carpal Tunnel Syndrome. No suggestion of polyneuropathy or radiculopathy. Clinical correlation recommended.   Sarina Ill, MD  [facility] [address] [place], [place] [address]  Phone [phone] Fax [phone]

## 2015-12-30 NOTE — Telephone Encounter (Signed)
Referred sent as requested

## 2015-12-31 NOTE — Progress Notes (Signed)
Quick Note:  Please let patient know his LFTs are normal. Repeat LFTs in 6 months. ______

## 2015-12-31 NOTE — Procedures (Signed)
[facility]    Provider:  Dr Jaynee Eagles Referring Provider: Arther Abbott, MD Primary Care Physician:  [name], MD   History:  [name] [name] is a 65 y.o. male here as a referral from Dr. Legrand Rams for numbness of right arm.  Right hand is going numb. The numbness radiates from the neck to the right hand with numbness.  Summary:   Nerve Conduction Studies were performed on the bilateral upper extremities.  The right median APB motor nerve showed prolonged distal onset latency (5.0 ms, N<4.0). The right Median 2nd Digit sensory nerve showed prolonged distal peak latency (5.3 ms, N<3.9). F Wave studies indicate that the right Median F wave has normal latency.  The left median APB motor nerve showed prolonged distal onset latency (8.1 ms, N<4.0). The left Median 2nd Digit sensory nerve showed No response. F Wave studies indicate that the left Median F wave has delayed latency(38 ms, N<34).  Bilateral Ulnar ADM motor nerves were within normal limits. F Wave studies indicate that the bilateral Ulnar F waves have normal latencies The bilateralUlnar 5th digit sensory nerves were within normal limits.  EMG needle study of selected bilateral upper extremity muscles was performed. The left Opponens Pollicis showed increased motor unit amplitude,diminished motor unit recruitment and prolonged motor unit duration. The right Opponens Pollicis showed diminished motor unit recruitment.  The following muscles were normal: Deltoid, Triceps, Pronator Teres, First Dorsal Interosseous, lower cervical paraspinal muscles bilaterally.  Conclusion: There is electrophysiologic evidence of bilateral moderately-severe left > right Carpal Tunnel Syndrome. No suggestion of polyneuropathy or radiculopathy. Clinical correlation recommended.   Sarina Ill, MD  [facility] [address] [place], [place] [address]  Phone [phone] Fax [phone]

## 2015-12-31 NOTE — Progress Notes (Signed)
See procedure note.

## 2016-01-03 ENCOUNTER — Other Ambulatory Visit: Payer: Self-pay

## 2016-01-03 DIAGNOSIS — K760 Fatty (change of) liver, not elsewhere classified: Secondary | ICD-10-CM

## 2016-01-03 NOTE — Progress Notes (Signed)
Quick Note:  Tried to call. Vm not set up. Will mail a letter of his normal LFT's and repeat in 6 months. ______

## 2016-01-25 ENCOUNTER — Telehealth: Payer: Self-pay | Admitting: *Deleted

## 2016-01-25 NOTE — Telephone Encounter (Signed)
Spoke with the receptionist at [facility], and she informed me that have tried to notify patient several times to schedule an appointment, but have been unable to reach him. She states they will try again and notify our office when an appointment has been scheduled.

## 2016-01-26 ENCOUNTER — Telehealth: Payer: Self-pay | Admitting: Orthopedic Surgery

## 2016-01-26 NOTE — Telephone Encounter (Signed)
Patient stopped by with questions regarding record request, which he states was from attorney office, [employer].  I verified with our contracted provider, [employer], that the request of 42 pages was completed and mailed, as well as invoice#[id number], being paid by this party.  I also spoke with Villa Herb at this office, at ph# [phone]/Fax# [phone], and he verified that these records were received.  He mentioned a letter of request sent on 01/12/16 to our office, Regarding a percentage rate.  I relayed that the request is pending Dr Harrison's review and response, and that Dr Aline Brochure has been out of office, and is out until next week.  Effie Shy at [facility] is also requesting bills, which I relayed will need to come from [facility] Billing (Ph# [phone]), and I have faxed our letter containing the billing contact information.  Spoke with Patient; he is aware of status.

## 2016-01-28 ENCOUNTER — Other Ambulatory Visit: Payer: Self-pay | Admitting: "Endocrinology

## 2016-01-31 ENCOUNTER — Encounter: Payer: Self-pay | Admitting: Orthopedic Surgery

## 2016-01-31 ENCOUNTER — Ambulatory Visit (INDEPENDENT_AMBULATORY_CARE_PROVIDER_SITE_OTHER): Payer: Medicare Other | Admitting: Orthopedic Surgery

## 2016-01-31 ENCOUNTER — Ambulatory Visit (INDEPENDENT_AMBULATORY_CARE_PROVIDER_SITE_OTHER): Payer: Medicare Other

## 2016-01-31 VITALS — BP 145/87 | HR 78 | Ht 64.0 in | Wt 242.0 lb

## 2016-01-31 DIAGNOSIS — M47812 Spondylosis without myelopathy or radiculopathy, cervical region: Secondary | ICD-10-CM

## 2016-01-31 DIAGNOSIS — G5601 Carpal tunnel syndrome, right upper limb: Secondary | ICD-10-CM | POA: Diagnosis not present

## 2016-01-31 DIAGNOSIS — I2510 Atherosclerotic heart disease of native coronary artery without angina pectoris: Secondary | ICD-10-CM

## 2016-01-31 DIAGNOSIS — M542 Cervicalgia: Secondary | ICD-10-CM

## 2016-01-31 NOTE — Patient Instructions (Signed)
Wear brace for 6 weeks

## 2016-01-31 NOTE — Progress Notes (Addendum)
Follow-up visit   Prior history complaint numbness right arm  The patient complains of no neck pain but his right hand is going numb.  He denies any trauma  He also complains of shoulder pain with this  The patient had nerve conduction study this showed left greater than right carpal tunnel syndrome without cervical changes  We will take an x-ray of his cervical spine  He still complains of right arm pain with numbness in his right hand  Review of systems no fever/chills  He had a CAT scan in 2011 which showed in plate vertebral body spurs in the cervical spine and he had an x-ray in 2012 CERVICAL SPINE - COMPLETE 4+ VIEW  Comparison: None  Findings: Normal alignment and no fracture. C6 and C7 are not optimally visualized due to overlying soft tissues. Anterior spurring at C4-5, C5-6, and C6-7. No focal bony abnormality.  IMPRESSION: Mild cervical disc degeneration. No acute bony abnormality.  Original Report Authenticated By: Truett Perna, M.D.  Today's x-ray continues to show anterior C5-6 osteophyte disc complex   The patient cannot take anti-inflammatories he's on CLOPIDOGREL he's had gabapentin hydrocodone  At this point I need to do an MRI of his cervical spine to rule out a cervical herniated disc   System review neck pain central and posterior and right side with tightness but no Spurling sign Strengthen rotator cuff normal rotator cuff and right shoulder range of motion normal stability normal.  Tenderness over the carpal tunnel positive carpal tunnel compression test decreased sensation thumb index long finger. He says occasionally ring and small finger also numb  No instability the wrist or elbow.  X-ray today again shows C-56 osteophyte disc complex but nerve study shows no compression in the cervical spine  Carpal tunnel syndrome right wrist Cervical spine degeneration C5-6  Recommend splint for 6 weeks come back recheck

## 2016-02-08 ENCOUNTER — Other Ambulatory Visit: Payer: Self-pay | Admitting: *Deleted

## 2016-02-08 DIAGNOSIS — H2512 Age-related nuclear cataract, left eye: Secondary | ICD-10-CM | POA: Diagnosis not present

## 2016-02-08 DIAGNOSIS — H25012 Cortical age-related cataract, left eye: Secondary | ICD-10-CM | POA: Diagnosis not present

## 2016-02-08 DIAGNOSIS — R809 Proteinuria, unspecified: Secondary | ICD-10-CM | POA: Diagnosis not present

## 2016-02-08 DIAGNOSIS — H2513 Age-related nuclear cataract, bilateral: Secondary | ICD-10-CM | POA: Diagnosis not present

## 2016-02-08 DIAGNOSIS — E119 Type 2 diabetes mellitus without complications: Secondary | ICD-10-CM | POA: Diagnosis not present

## 2016-02-08 DIAGNOSIS — E559 Vitamin D deficiency, unspecified: Secondary | ICD-10-CM | POA: Diagnosis not present

## 2016-02-08 DIAGNOSIS — Z794 Long term (current) use of insulin: Secondary | ICD-10-CM | POA: Diagnosis not present

## 2016-02-08 DIAGNOSIS — R200 Anesthesia of skin: Secondary | ICD-10-CM

## 2016-02-08 DIAGNOSIS — Z79899 Other long term (current) drug therapy: Secondary | ICD-10-CM | POA: Diagnosis not present

## 2016-02-08 DIAGNOSIS — D519 Vitamin B12 deficiency anemia, unspecified: Secondary | ICD-10-CM | POA: Diagnosis not present

## 2016-02-08 DIAGNOSIS — N183 Chronic kidney disease, stage 3 (moderate): Secondary | ICD-10-CM | POA: Diagnosis not present

## 2016-02-08 DIAGNOSIS — I10 Essential (primary) hypertension: Secondary | ICD-10-CM | POA: Diagnosis not present

## 2016-02-08 DIAGNOSIS — H25013 Cortical age-related cataract, bilateral: Secondary | ICD-10-CM | POA: Diagnosis not present

## 2016-02-14 ENCOUNTER — Telehealth: Payer: Self-pay | Admitting: Orthopedic Surgery

## 2016-02-14 NOTE — Telephone Encounter (Signed)
No pain meds can be prescribed   York Cerise please sort this out

## 2016-02-14 NOTE — Telephone Encounter (Signed)
ROUTING TO DR HARRISON FOR REVIEW 

## 2016-02-14 NOTE — Telephone Encounter (Signed)
Patient called and stated he went for MRI but was unable to do this because of the implant that he has.  He wants to know what should he do next?  Also, he is requesting something for pain.  boo

## 2016-02-15 ENCOUNTER — Inpatient Hospital Stay: Admission: RE | Admit: 2016-02-15 | Payer: Self-pay | Source: Ambulatory Visit

## 2016-02-15 ENCOUNTER — Other Ambulatory Visit: Payer: Self-pay | Admitting: *Deleted

## 2016-02-15 DIAGNOSIS — M47812 Spondylosis without myelopathy or radiculopathy, cervical region: Secondary | ICD-10-CM

## 2016-02-15 NOTE — Telephone Encounter (Signed)
Spoke with radiology, no MRI do to penial implant  CT ordered

## 2016-02-17 ENCOUNTER — Telehealth: Payer: Self-pay | Admitting: Internal Medicine

## 2016-02-17 MED ORDER — HYDRALAZINE HCL 10 MG PO TABS: 10.0000 mg | ORAL_TABLET | Freq: Three times a day (TID) | ORAL | 0 refills | Status: DC

## 2016-02-17 NOTE — Telephone Encounter (Signed)
New message    Calling to get a prescription called in for pt. The prescription is for Hydralazine 10 mg. Please call.

## 2016-02-17 NOTE — Telephone Encounter (Signed)
Rx submitted electronically

## 2016-02-22 ENCOUNTER — Other Ambulatory Visit: Payer: Self-pay | Admitting: *Deleted

## 2016-02-28 ENCOUNTER — Other Ambulatory Visit: Payer: Self-pay | Admitting: "Endocrinology

## 2016-03-01 ENCOUNTER — Telehealth: Payer: Self-pay | Admitting: *Deleted

## 2016-03-01 NOTE — Telephone Encounter (Signed)
Patient is scheduled for 03/03/16 at 10:30 amwith Dr. Fredonia Highland. He will see him and if he thinks he need to see Dr. Ron Agee he will schedule him an appointment with him for injections. Patient was made aware of date and time.

## 2016-03-02 ENCOUNTER — Ambulatory Visit
Admission: RE | Admit: 2016-03-02 | Discharge: 2016-03-02 | Disposition: A | Discharge status: Home / Self Care | Payer: Medicare Other | Source: Ambulatory Visit | Attending: Orthopedic Surgery | Admitting: Orthopedic Surgery

## 2016-03-02 DIAGNOSIS — M47812 Spondylosis without myelopathy or radiculopathy, cervical region: Secondary | ICD-10-CM

## 2016-03-03 DIAGNOSIS — M542 Cervicalgia: Secondary | ICD-10-CM | POA: Diagnosis not present

## 2016-03-03 DIAGNOSIS — M25511 Pain in right shoulder: Secondary | ICD-10-CM | POA: Diagnosis not present

## 2016-03-04 ENCOUNTER — Emergency Department (HOSPITAL_COMMUNITY)
Admission: EM | Admit: 2016-03-04 | Discharge: 2016-03-04 | Disposition: A | Discharge status: Home / Self Care | Payer: Medicare Other | Attending: Emergency Medicine | Admitting: Emergency Medicine

## 2016-03-04 ENCOUNTER — Encounter (HOSPITAL_COMMUNITY): Payer: Self-pay | Admitting: *Deleted

## 2016-03-04 DIAGNOSIS — I129 Hypertensive chronic kidney disease with stage 1 through stage 4 chronic kidney disease, or unspecified chronic kidney disease: Secondary | ICD-10-CM | POA: Insufficient documentation

## 2016-03-04 DIAGNOSIS — Z79899 Other long term (current) drug therapy: Secondary | ICD-10-CM | POA: Diagnosis not present

## 2016-03-04 DIAGNOSIS — J449 Chronic obstructive pulmonary disease, unspecified: Secondary | ICD-10-CM | POA: Diagnosis not present

## 2016-03-04 DIAGNOSIS — E1165 Type 2 diabetes mellitus with hyperglycemia: Secondary | ICD-10-CM | POA: Insufficient documentation

## 2016-03-04 DIAGNOSIS — I2510 Atherosclerotic heart disease of native coronary artery without angina pectoris: Secondary | ICD-10-CM | POA: Insufficient documentation

## 2016-03-04 DIAGNOSIS — Z87891 Personal history of nicotine dependence: Secondary | ICD-10-CM | POA: Diagnosis not present

## 2016-03-04 DIAGNOSIS — N184 Chronic kidney disease, stage 4 (severe): Secondary | ICD-10-CM | POA: Diagnosis not present

## 2016-03-04 DIAGNOSIS — R739 Hyperglycemia, unspecified: Secondary | ICD-10-CM | POA: Diagnosis not present

## 2016-03-04 DIAGNOSIS — Z794 Long term (current) use of insulin: Secondary | ICD-10-CM | POA: Diagnosis not present

## 2016-03-04 LAB — CBC WITH DIFFERENTIAL/PLATELET
BASOS PCT: 0 %
Basophils Absolute: 0 10*3/uL (ref 0.0–0.1)
EOS ABS: 0.1 10*3/uL (ref 0.0–0.7)
Eosinophils Relative: 3 %
HCT: 34.7 % — ABNORMAL LOW (ref 39.0–52.0)
HEMOGLOBIN: 11.3 g/dL — AB (ref 13.0–17.0)
LYMPHS ABS: 0.7 10*3/uL (ref 0.7–4.0)
Lymphocytes Relative: 19 %
MCH: 26.8 pg (ref 26.0–34.0)
MCHC: 32.6 g/dL (ref 30.0–36.0)
MCV: 82.4 fL (ref 78.0–100.0)
MONO ABS: 0.4 10*3/uL (ref 0.1–1.0)
MONOS PCT: 11 %
NEUTROS PCT: 67 %
Neutro Abs: 2.6 10*3/uL (ref 1.7–7.7)
Platelets: 131 10*3/uL — ABNORMAL LOW (ref 150–400)
RBC: 4.21 MIL/uL — ABNORMAL LOW (ref 4.22–5.81)
RDW: 14.9 % (ref 11.5–15.5)
WBC: 3.8 10*3/uL — ABNORMAL LOW (ref 4.0–10.5)

## 2016-03-04 LAB — BASIC METABOLIC PANEL
Anion gap: 4 — ABNORMAL LOW (ref 5–15)
BUN: 37 mg/dL — AB (ref 6–20)
CALCIUM: 8.3 mg/dL — AB (ref 8.9–10.3)
CHLORIDE: 107 mmol/L (ref 101–111)
CO2: 27 mmol/L (ref 22–32)
CREATININE: 3.49 mg/dL — AB (ref 0.61–1.24)
GFR calc non Af Amer: 17 mL/min — ABNORMAL LOW (ref >60)
GFR, EST AFRICAN AMERICAN: 20 mL/min — AB (ref >60)
Glucose, Bld: 370 mg/dL — ABNORMAL HIGH (ref 65–99)
Potassium: 4.1 mmol/L (ref 3.5–5.1)
SODIUM: 138 mmol/L (ref 135–145)

## 2016-03-04 LAB — CBG MONITORING, ED
GLUCOSE-CAPILLARY: 285 mg/dL — AB (ref 65–99)
Glucose-Capillary: 355 mg/dL — ABNORMAL HIGH (ref 65–99)

## 2016-03-04 MED ORDER — SODIUM CHLORIDE 0.9 % IV BOLUS (SEPSIS)
1000.0000 mL | Freq: Once | INTRAVENOUS | Status: AC
  Administered 2016-03-04: 1000 mL via INTRAVENOUS

## 2016-03-04 MED ORDER — INSULIN ASPART 100 UNIT/ML ~~LOC~~ SOLN
5.0000 [IU] | Freq: Once | SUBCUTANEOUS | Status: AC
  Administered 2016-03-04: 5 [IU] via SUBCUTANEOUS
  Filled 2016-03-04: qty 1

## 2016-03-04 MED ORDER — INSULIN GLARGINE 100 UNIT/ML ~~LOC~~ SOLN
50.0000 [IU] | Freq: Once | SUBCUTANEOUS | Status: AC
  Administered 2016-03-04: 50 [IU] via SUBCUTANEOUS
  Filled 2016-03-04: qty 0.5

## 2016-03-04 NOTE — Discharge Instructions (Signed)
Take your usual prescriptions as previously directed.  Return to the Emergency Department tomorrow for your usual insulin injection. Go to your regular medical doctor on Monday to receive a dose of insulin if you have not received your insulin by Monday. Return to the Emergency Department immediately sooner if worsening.

## 2016-03-04 NOTE — ED Triage Notes (Signed)
Pt comes in for hyperglycemia. States he is getting readings of 400's at home. Pt is alert and oriented upon arrival. NAD noted. Pt has been out of his insulin for 3 days. Denies any n/v/d.

## 2016-03-04 NOTE — ED Provider Notes (Signed)
[facility] DEPT Provider Note   CSN: [id number] Arrival date & time: 03/04/16  1017     History   Chief Complaint Chief Complaint  Patient presents with  . Hyperglycemia    HPI [name] [name] is a 65 y.o. male.  HPI  Pt was seen at 1040.  Per pt and his wife, c/o gradual onset and persistence of constant "high blood sugars" for the past 3 days. Pt states his symptoms began after he ran out of insulin 3 days ago. Pt's wife states the insulin is on mail order and "just got shipped." Pt came to the ED "to get my sugar down." Denies any other complaints. Denies N/V/D, no abd pain, no CP/SOB, no cough, no fevers, no rash.    Past Medical History:  Diagnosis Date  . Arthritis   . Bell palsy   . CAD (coronary artery disease)    a. 2014 MV: abnl w/ infap ischemia; b. 03/2013 Cath: aneurysmal bleb in the LAD w/ otw nonobs dzs-->Med Rx.  . Chronic back pain   . Chronic knee pain    a. 09/2015 s/p R TKA.  Marland Kitchen Chronic pain   . Chronic shoulder pain   . Chronic sinusitis   . CKD (chronic kidney disease), stage IV ([place])    stage 4 per office visit note of Dr Lowanda Foster on 05/2015   . COPD (chronic obstructive pulmonary disease) ([place])   . Essential hypertension   . GERD (gastroesophageal reflux disease)   . Hepatomegaly    noted on noncontrast CT 2015  . Hyperlipidemia   . Lateral meniscus tear   . Obesity    [place]  . Obstructive sleep apnea    cpap - settings at 4   . On home oxygen therapy    uses 2l when is going somewhere per patient   . Post traumatic stress disorder   . PUD (peptic ulcer disease)    remote, reports f/u EGD about 8 years ago unremarkable   . Reactive airway disease    related to exposure to chemical during 9/11  . Sinusitis   . Vitamin D deficiency     Patient Active Problem List   Diagnosis Date Noted  . CAD (coronary artery disease)   . Erectile dysfunction 08/16/2015  . Hemorrhoids 07/05/2015  . Vitamin D deficiency 06/04/2015  . OSA on CPAP  05/14/2015  . Anemia in chronic kidney disease 03/24/2015  . Fatty liver 12/01/2014  . Back pain 06/05/2013  . OA (osteoarthritis) of knee 03/15/2011  . CTS (carpal tunnel syndrome) 03/15/2011  . Type 2 diabetes mellitus with stage 4 chronic kidney disease ([place]) 09/29/2008  . Hyperlipidemia 09/29/2008  . Essential hypertension 09/29/2008  . GERD 09/29/2008    Past Surgical History:  Procedure Laterality Date  . ASAD LT SHOULDER  12/2008   left shoulder  . COLONOSCOPY  10/2008   Fields: Rectal polyp obliterated, not retrieved, hemorrhoids, single ascending colon diverticulum near the CV. Next colonoscopy April 2020  . COLONOSCOPY N/A 12/25/2014   SLF: 1. Colorectal polyps (2) removed 2. Small internal hemorrhoids 3. the left colon is severely redundant  . DOPPLER ECHOCARDIOGRAPHY    . ESOPHAGOGASTRODUODENOSCOPY N/A 12/25/2014   SLF: 1. Anemia most likely due to CRI, gastritis, gastric polyps 2. Moderate non-erosive gastriits and mild duodenitis.  3.TWo large gstric polyps removed.   Marland Kitchen EYE SURGERY  12/22/2010   tear duct probing-[place]  . FOREIGN BODY REMOVAL  03/29/2011   Procedure: REMOVAL FOREIGN BODY EXTREMITY;  Surgeon: Arther Abbott, MD;  Location: [facility]S;  Service: Orthopedics;  Laterality: Right;  Removal Foreign Body Right Thumb  . KNEE ARTHROSCOPY  10/2007   left  . KNEE ARTHROSCOPY WITH LATERAL MENISECTOMY Right 10/14/2015   Procedure: LEFT KNEE ARTHROSCOPY WITH PARTIAL LATERAL MENISECTOMY;  Surgeon: Carole Civil, MD;  Location: [facility] OR[facility];  Service: Orthopedics;  Laterality: Right;  . LEFT HEART CATHETERIZATION WITH CORONARY ANGIOGRAM N/A 03/28/2013   Procedure: LEFT HEART CATHETERIZATION WITH CORONARY ANGIOGRAM;  Surgeon: Leonie Man, MD;  Location: [facility] C[facility] LAB;  Service: Cardiovascular;  Laterality: N/A;  . NM MYOVIEW LTD    . PENILE PROSTHESIS IMPLANT N/A 08/16/2015   Procedure: PENILE PROTHESIS INFLATABLE, three piece, Excisional biopsy of Penile ulcer, Penile  molding;  Surgeon: Carolan Clines, MD;  Location: [facility] ORS;  Service: Urology;  Laterality: N/A;  . QUADRICEPS TENDON REPAIR  07/21/2011   Procedure: REPAIR QUADRICEP TENDON;  Surgeon: Arther Abbott, MD;  Location: [facility];  Service: Orthopedics;  Laterality: Right;  . TOENAIL EXCISION     removed 0000000  . UMBILICAL HERNIA REPAIR  2007   [place]       Home Medications    Prior to Admission medications   Medication Sig Start Date End Date Taking? Authorizing Provider  albuterol (PROVENTIL HFA) 108 (90 BASE) MCG/ACT inhaler Inhale 2 puffs into the lungs every 4 (four) hours as needed for wheezing.    Yes Historical Provider, MD  amLODipine (NORVASC) 10 MG tablet Take 10 mg by mouth daily.    Yes Historical Provider, MD  budesonide-formoterol (SYMBICORT) 160-4.5 MCG/ACT inhaler Inhale 2 puffs into the lungs daily as needed (Shortness of Breath).    Yes Historical Provider, MD  cetirizine (ZYRTEC) 10 MG tablet Take 10 mg by mouth daily as needed for allergies.    Yes Historical Provider, MD  clopidogrel (PLAVIX) 75 MG tablet Take 75 mg by mouth daily.   Yes Historical Provider, MD  hydrALAZINE (APRESOLINE) 10 MG tablet Take 1 tablet (10 mg total) by mouth 3 (three) times daily. Keep appointment. 02/17/16  Yes Pixie Casino, MD  iron polysaccharides (NIFEREX) 150 MG capsule Take 150 mg by mouth daily.   Yes Historical Provider, MD  LANTUS SOLOSTAR 100 UNIT/ML Solostar Pen INJECT  50 UNITS SUBCUTANEOUSLY AT BEDTIME 02/28/16  Yes Cassandria Anger, MD  mometasone (NASONEX) 50 MCG/ACT nasal spray Place 2 sprays into the nose daily as needed (allergies).    Yes Historical Provider, MD  pantoprazole (PROTONIX) 40 MG tablet 1 PO 30 MINS PRIOR TO MEALS BID 12/30/15  Yes Danie Binder, MD  simvastatin (ZOCOR) 20 MG tablet Take 20 mg by mouth every morning.    Yes Historical Provider, MD  TRADJENTA 5 MG TABS tablet TAKE 1 TABLET EVERY DAY 01/31/16  Yes Cassandria Anger, MD    Family  History Family History  Problem Relation Age of Onset  . Hypertension Mother     MI  . Cancer Mother     breast   . Diabetes Mother   . Diabetes Father   . Hypertension Father   . Hypertension Sister   . Diabetes Sister   . Arthritis    . Asthma    . Lung disease    . Anesthesia problems Neg Hx   . Hypotension Neg Hx   . Malignant hyperthermia Neg Hx   . Pseudochol deficiency Neg Hx   . Colon cancer Neg Hx     Social History Social History  Substance Use Topics  . Smoking status: Former Smoker    Packs/day: 1.00    Years: 25.00    Types: Cigarettes    Quit date: 03/27/2010  . Smokeless tobacco: Never Used     Comment: Quit x 7 years  . Alcohol use 0.0 oz/week     Comment: occasionally     Allergies   Opana [oxymorphone hcl] and Tramadol   Review of Systems Review of Systems ROS: Statement: All systems negative except as marked or noted in the HPI; Constitutional: Negative for fever and chills. ; ; Eyes: Negative for eye pain, redness and discharge. ; ; ENMT: Negative for ear pain, hoarseness, nasal congestion, sinus pressure and sore throat. ; ; Cardiovascular: Negative for chest pain, palpitations, diaphoresis, dyspnea and peripheral edema. ; ; Respiratory: Negative for cough, wheezing and stridor. ; ; Gastrointestinal: Negative for nausea, vomiting, diarrhea, abdominal pain, blood in stool, hematemesis, jaundice and rectal bleeding. . ; ; Genitourinary: Negative for dysuria, flank pain and hematuria. ; ; Musculoskeletal: Negative for back pain and neck pain. Negative for swelling and trauma.; ; Skin: Negative for pruritus, rash, abrasions, blisters, bruising and skin lesion.; ; Neuro: Negative for headache, lightheadedness and neck stiffness. Negative for weakness, altered level of consciousness, altered mental status, extremity weakness, paresthesias, involuntary movement, seizure and syncope.      Physical Exam Updated Vital Signs BP 138/85   Pulse 71   Temp 97.7  F (36.5 C) (Oral)   Resp 18   Ht 5\' 4"  (1.626 m)   Wt 244 lb (110.7 kg)   SpO2 97%   BMI 41.88 kg/m   Physical Exam 1045: Physical examination:  Nursing notes reviewed; Vital signs and O2 SAT reviewed;  Constitutional: Well developed, Well nourished, Well hydrated, In no acute distress; Head:  Normocephalic, atraumatic; Eyes: EOMI, PERRL, No scleral icterus; ENMT: Mouth and pharynx normal, Mucous membranes moist; Neck: Supple, Full range of motion, No lymphadenopathy; Cardiovascular: Regular rate and rhythm, No gallop; Respiratory: Breath sounds clear & equal bilaterally, No wheezes.  Speaking full sentences with ease, Normal respiratory effort/excursion; Chest: Nontender, Movement normal; Abdomen: Soft, Nontender, Nondistended, Normal bowel sounds; Genitourinary: No CVA tenderness; Extremities: Pulses normal, No tenderness, No edema, No calf edema or asymmetry.; Neuro: AA&Ox3, Major CN grossly intact.  Speech clear. No gross focal motor or sensory deficits in extremities.; Skin: Color normal, Warm, Dry.     ED Treatments / Results  Labs (all labs ordered are listed, but only abnormal results are displayed)   EKG  EKG Interpretation None       Radiology   Procedures Procedures (including critical care time)  Medications Ordered in ED Medications  sodium chloride 0.9 % bolus 1,000 mL (0 mLs Intravenous Stopped 03/04/16 1231)  insulin glargine (LANTUS) injection 50 Units (50 Units Subcutaneous Given 03/04/16 1117)  insulin aspart (novoLOG) injection 5 Units (5 Units Subcutaneous Given 03/04/16 1122)     Initial Impression / Assessment and Plan / ED Course  I have reviewed the triage vital signs and the nursing notes.  Pertinent labs & imaging results that were available during my care of the patient were reviewed by me and considered in my medical decision making (see chart for details).  MDM Reviewed: nursing note, vitals and previous chart Reviewed previous:  labs Interpretation: labs   Results for orders placed or performed during the hospital encounter of 03/04/16  CBC with Differential  Result Value Ref Range   WBC 3.8 (L) 4.0 - 10.5 K/uL  RBC 4.21 (L) 4.22 - 5.81 MIL/uL   Hemoglobin 11.3 (L) 13.0 - 17.0 g/dL   HCT 34.7 (L) 39.0 - 52.0 %   MCV 82.4 78.0 - 100.0 fL   MCH 26.8 26.0 - 34.0 pg   MCHC 32.6 30.0 - 36.0 g/dL   RDW 14.9 11.5 - 15.5 %   Platelets 131 (L) 150 - 400 K/uL   Neutrophils Relative % 67 %   Neutro Abs 2.6 1.7 - 7.7 K/uL   Lymphocytes Relative 19 %   Lymphs Abs 0.7 0.7 - 4.0 K/uL   Monocytes Relative 11 %   Monocytes Absolute 0.4 0.1 - 1.0 K/uL   Eosinophils Relative 3 %   Eosinophils Absolute 0.1 0.0 - 0.7 K/uL   Basophils Relative 0 %   Basophils Absolute 0.0 0.0 - 0.1 K/uL  Basic metabolic panel  Result Value Ref Range   Sodium 138 135 - 145 mmol/L   Potassium 4.1 3.5 - 5.1 mmol/L   Chloride 107 101 - 111 mmol/L   CO2 27 22 - 32 mmol/L   Glucose, Bld 370 (H) 65 - 99 mg/dL   BUN 37 (H) 6 - 20 mg/dL   Creatinine, Ser 3.49 (H) 0.61 - 1.24 mg/dL   Calcium 8.3 (L) 8.9 - 10.3 mg/dL   GFR calc non Af Amer 17 (L) >60 mL/min   GFR calc Af Amer 20 (L) >60 mL/min   Anion gap 4 (L) 5 - 15  POC CBG, ED  Result Value Ref Range   Glucose-Capillary 355 (H) 65 - 99 mg/dL  CBG monitoring, ED  Result Value Ref Range   Glucose-Capillary 285 (H) 65 - 99 mg/dL   Comment 1 Document in Chart      1100:  CM Kim has spoken with pt and his wife: Pt admitted that he did not tell his wife he needed insulin early enough before he ran out, so his insulin was just shipped from the mail order company yesterday. Insulin is due to arrive by Tuesday; Pt and his wife state they can be seen by his PMD on Monday for insulin "because they have some samples in the office;" pt unable to fill a new rx due to his standing rx being filled already, will not have insulin only for tomorrow, may need observation admit (based on results work up).   Will check labs, dose IVF bolus, SQ lantus and regular insulin.   1325:  BUN/Cr per baseline. CBG trending downward after IVF and insulin.  No clear indication for admission at this time. T/C to [facility]. Karleen Hampshire, case discussed, including:  HPI, pertinent PM/SHx, VS/PE, dx testing, ED course and treatment:  Agrees no clear indication for admission at this time. Pt will return to the ED tomorrow for his usual dose of SQ insulin and on Monday will go to his PMD's office for an insulin injection (hopefully will have med by Tuesday). Dx and testing d/w pt and family.  Questions answered.  Verb understanding, agreeable to d/c home with outpt f/u.     Final Clinical Impressions(s) / ED Diagnoses   Final diagnoses:  None    New Prescriptions New Prescriptions   No medications on file     Francine Graven, DO 03/07/16 1514

## 2016-03-04 NOTE — Care Management Note (Signed)
Case Management Note  Patient Details  Name: [name] [name] MRN: [id number] Date of Birth: 1950/07/22  Subjective/Objective:    Admitted with hyperglycemia. Pt is alert and oriented.  Family supportive with care. Patient did not have medication/insulin refilled and does mail order through insurance for medications.  Medications were shipped out August 18th and was informed it would take three days to arrive.  Patient to contact PCP on Monday to see if he can get a sample size of insulin to take until medication arrives.  He plans to return home with self care.                   Action/Plan:   Expected Discharge Date: 03/04/16                 Expected Discharge Plan:     In-House Referral:     Discharge planning Services     Post Acute Care Choice:    Choice offered to:     DME Arranged:    DME Agency:     HH Arranged:    HH Agency:     Status of Service:     If discussed at [employer], dates discussed:    Additional Comments:  Briant Sites, RN 03/04/2016, 11:01 AM

## 2016-03-13 ENCOUNTER — Other Ambulatory Visit: Payer: Self-pay | Admitting: "Endocrinology

## 2016-03-13 ENCOUNTER — Ambulatory Visit: Payer: Self-pay | Admitting: Orthopedic Surgery

## 2016-03-13 DIAGNOSIS — M5412 Radiculopathy, cervical region: Secondary | ICD-10-CM | POA: Diagnosis not present

## 2016-03-13 DIAGNOSIS — E1122 Type 2 diabetes mellitus with diabetic chronic kidney disease: Secondary | ICD-10-CM | POA: Diagnosis not present

## 2016-03-13 DIAGNOSIS — Z794 Long term (current) use of insulin: Secondary | ICD-10-CM | POA: Diagnosis not present

## 2016-03-13 DIAGNOSIS — M542 Cervicalgia: Secondary | ICD-10-CM | POA: Diagnosis not present

## 2016-03-13 DIAGNOSIS — N184 Chronic kidney disease, stage 4 (severe): Secondary | ICD-10-CM | POA: Diagnosis not present

## 2016-03-13 LAB — COMPLETE METABOLIC PANEL WITH GFR
ALBUMIN: 3.9 g/dL (ref 3.6–5.1)
ALK PHOS: 51 U/L (ref 40–115)
ALT: 14 U/L (ref 9–46)
AST: 14 U/L (ref 10–35)
BUN: 32 mg/dL — AB (ref 7–25)
CALCIUM: 8.8 mg/dL (ref 8.6–10.3)
CHLORIDE: 106 mmol/L (ref 98–110)
CO2: 26 mmol/L (ref 20–31)
CREATININE: 3.41 mg/dL — AB (ref 0.70–1.25)
GFR, Est African American: 21 mL/min — ABNORMAL LOW (ref >=60)
GFR, Est Non African American: 18 mL/min — ABNORMAL LOW (ref >=60)
Glucose, Bld: 146 mg/dL — ABNORMAL HIGH (ref 65–99)
Potassium: 4 mmol/L (ref 3.5–5.3)
Sodium: 140 mmol/L (ref 135–146)
Total Bilirubin: 0.4 mg/dL (ref 0.2–1.2)
Total Protein: 7 g/dL (ref 6.1–8.1)

## 2016-03-13 LAB — HEMOGLOBIN A1C
HEMOGLOBIN A1C: 6.8 % — AB (ref <5.7)
Mean Plasma Glucose: 148 mg/dL

## 2016-03-15 DIAGNOSIS — E1165 Type 2 diabetes mellitus with hyperglycemia: Secondary | ICD-10-CM | POA: Diagnosis not present

## 2016-03-15 DIAGNOSIS — M5489 Other dorsalgia: Secondary | ICD-10-CM | POA: Diagnosis not present

## 2016-03-15 DIAGNOSIS — N184 Chronic kidney disease, stage 4 (severe): Secondary | ICD-10-CM | POA: Diagnosis not present

## 2016-03-22 ENCOUNTER — Ambulatory Visit (INDEPENDENT_AMBULATORY_CARE_PROVIDER_SITE_OTHER): Payer: Medicare Other | Admitting: "Endocrinology

## 2016-03-22 ENCOUNTER — Encounter: Payer: Self-pay | Admitting: "Endocrinology

## 2016-03-22 VITALS — BP 136/87 | HR 80 | Ht 64.0 in | Wt 246.0 lb

## 2016-03-22 DIAGNOSIS — E669 Obesity, unspecified: Secondary | ICD-10-CM | POA: Diagnosis not present

## 2016-03-22 DIAGNOSIS — I10 Essential (primary) hypertension: Secondary | ICD-10-CM | POA: Diagnosis not present

## 2016-03-22 DIAGNOSIS — N184 Chronic kidney disease, stage 4 (severe): Secondary | ICD-10-CM

## 2016-03-22 DIAGNOSIS — E559 Vitamin D deficiency, unspecified: Secondary | ICD-10-CM | POA: Diagnosis not present

## 2016-03-22 DIAGNOSIS — I2510 Atherosclerotic heart disease of native coronary artery without angina pectoris: Secondary | ICD-10-CM | POA: Diagnosis not present

## 2016-03-22 DIAGNOSIS — Z794 Long term (current) use of insulin: Secondary | ICD-10-CM | POA: Diagnosis not present

## 2016-03-22 DIAGNOSIS — E785 Hyperlipidemia, unspecified: Secondary | ICD-10-CM

## 2016-03-22 DIAGNOSIS — E1122 Type 2 diabetes mellitus with diabetic chronic kidney disease: Secondary | ICD-10-CM | POA: Diagnosis not present

## 2016-03-22 MED ORDER — INSULIN GLARGINE 100 UNIT/ML SOLOSTAR PEN: 40.0000 [IU] | PEN_INJECTOR | Freq: Every day | SUBCUTANEOUS | 0 refills | Status: DC

## 2016-03-22 MED ORDER — VITAMIN D3 125 MCG (5000 UT) PO CAPS: 5000.0000 [IU] | ORAL_CAPSULE | Freq: Every day | ORAL | 0 refills | Status: DC

## 2016-03-22 NOTE — Patient Instructions (Signed)

## 2016-03-22 NOTE — Patient Instructions (Signed)
Your procedure is scheduled on:  03/28/2016               Report to [facility] at  6:15   AM.  Call this number if you have problems the morning of surgery: [phone]   Remember:   Do not eat or drink :After Midnight.    Take these medicines the morning of surgery with A SIP OF WATER:     Amilidipine, Zyrtec, Hydralazine and Protonix                 Take 1/2 dose Lantus insulin night before surgery    Do not wear jewelry, make-up or nail polish.  Do not wear lotions, powders, or perfumes. You may wear deodorant.  Do not bring valuables to the hospital.  Contacts, dentures or bridgework may not be worn into surgery.  Patients discharged the day of surgery will not be allowed to drive home.  Name and phone number of your driver:    @[email or web address] @ Cataract Surgery  A cataract is a clouding of the lens of the eye. When a lens becomes cloudy, vision is reduced based on the degree and nature of the clouding. Surgery may be needed to improve vision. Surgery removes the cloudy lens and usually replaces it with a substitute lens (intraocular lens, IOL). LET YOUR EYE DOCTOR KNOW ABOUT:  Allergies to food or medicine.   Medicines taken including herbs, eyedrops, over-the-counter medicines, and creams.   Use of steroids (by mouth or creams).   Previous problems with anesthetics or numbing medicine.   History of bleeding problems or blood clots.   Previous surgery.   Other health problems, including diabetes and kidney problems.   Possibility of pregnancy, if this applies.  RISKS AND COMPLICATIONS  Infection.   Inflammation of the eyeball (endophthalmitis) that can spread to both eyes (sympathetic ophthalmia).   Poor wound healing.   If an IOL is inserted, it can later fall out of proper position. This is very uncommon.   Clouding of the part of your eye that holds an IOL in place. This is called an "after-cataract." These are uncommon, but easily treated.  BEFORE THE  PROCEDURE  Do not eat or drink anything except small amounts of water for 8 to 12 before your surgery, or as directed by your caregiver.   Unless you are told otherwise, continue any eyedrops you have been prescribed.   Talk to your primary caregiver about all other medicines that you take (both prescription and non-prescription). In some cases, you may need to stop or change medicines near the time of your surgery. This is most important if you are taking blood-thinning medicine.Do not stop medicines unless you are told to do so.   Arrange for someone to drive you to and from the procedure.   Do not put contact lenses in either eye on the day of your surgery.  PROCEDURE There is more than one method for safely removing a cataract. Your doctor can explain the differences and help determine which is best for you. Phacoemulsification surgery is the most common form of cataract surgery.  An injection is given behind the eye or eyedrops are given to make this a painless procedure.   A small cut (incision) is made on the edge of the clear, dome-shaped surface that covers the front of the eye (cornea).   A tiny probe is painlessly inserted into the eye. This device gives off ultrasound waves that soften and break up the  cloudy center of the lens. This makes it easier for the cloudy lens to be removed by suction.   An IOL may be implanted.   The normal lens of the eye is covered by a clear capsule. Part of that capsule is intentionally left in the eye to support the IOL.   Your surgeon may or may not use stitches to close the incision.  There are other forms of cataract surgery that require a larger incision and stiches to close the eye. This approach is taken in cases where the doctor feels that the cataract cannot be easily removed using phacoemulsification. AFTER THE PROCEDURE  When an IOL is implanted, it does not need care. It becomes a permanent part of your eye and cannot be seen or  felt.   Your doctor will schedule follow-up exams to check on your progress.   Review your other medicines with your doctor to see which can be resumed after surgery.   Use eyedrops or take medicine as prescribed by your doctor.  Document Released: 06/22/2011 Document Reviewed: 06/19/2011 [facility] Patient Information 2012 [place].  .Cataract Surgery Care After Refer to this sheet in the next few weeks. These instructions provide you with information on caring for yourself after your procedure. Your caregiver may also give you more specific instructions. Your treatment has been planned according to current medical practices, but problems sometimes occur. Call your caregiver if you have any problems or questions after your procedure.  HOME CARE INSTRUCTIONS   Avoid strenuous activities as directed by your caregiver.   Ask your caregiver when you can resume driving.   Use eyedrops or other medicines to help healing and control pressure inside your eye as directed by your caregiver.   Only take over-the-counter or prescription medicines for pain, discomfort, or fever as directed by your caregiver.   Do not to touch or rub your eyes.   You may be instructed to use a protective shield during the first few days and nights after surgery. If not, wear sunglasses to protect your eyes. This is to protect the eye from pressure or from being accidentally bumped.   Keep the area around your eye clean and dry. Avoid swimming or allowing water to hit you directly in the face while showering. Keep soap and shampoo out of your eyes.   Do not bend or lift heavy objects. Bending increases pressure in the eye. You can walk, climb stairs, and do light household chores.   Do not put a contact lens into the eye that had surgery until your caregiver says it is okay to do so.   Ask your doctor when you can return to work. This will depend on the kind of work that you do. If you work in a dusty  environment, you may be advised to wear protective eyewear for a period of time.   Ask your caregiver when it will be safe to engage in sexual activity.   Continue with your regular eye exams as directed by your caregiver.  What to expect:  It is normal to feel itching and mild discomfort for a few days after cataract surgery. Some fluid discharge is also common, and your eye may be sensitive to light and touch.   After 1 to 2 days, even moderate discomfort should disappear. In most cases, healing will take about 6 weeks.   If you received an intraocular lens (IOL), you may notice that colors are very bright or have a blue tinge. Also,  if you have been in bright sunlight, everything may appear reddish for a few hours. If you see these color tinges, it is because your lens is clear and no longer cloudy. Within a few months after receiving an IOL, these extra colors should go away. When you have healed, you will probably need new glasses.  SEEK MEDICAL CARE IF:   You have increased bruising around your eye.   You have discomfort not helped by medicine.  SEEK IMMEDIATE MEDICAL CARE IF:   You have a fever.   You have a worsening or sudden vision loss.   You have redness, swelling, or increasing pain in the eye.   You have a thick discharge from the eye that had surgery.  MAKE SURE YOU:  Understand these instructions.   Will watch your condition.   Will get help right away if you are not doing well or get worse.  Document Released: 01/20/2005 Document Revised: 06/22/2011 Document Reviewed: 02/24/2011 [facility] Patient Information 2012 [place].    Monitored Anesthesia Care  Monitored anesthesia care is an anesthesia service for a medical procedure. Anesthesia is the loss of the ability to feel pain. It is produced by medications called anesthetics. It may affect a small area of your body (local anesthesia), a large area of your body (regional anesthesia), or your entire body  (general anesthesia). The need for monitored anesthesia care depends your procedure, your condition, and the potential need for regional or general anesthesia. It is often provided during procedures where:   General anesthesia may be needed if there are complications. This is because you need special care when you are under general anesthesia.   You will be under local or regional anesthesia. This is so that you are able to have higher levels of anesthesia if needed.   You will receive calming medications (sedatives). This is especially the case if sedatives are given to put you in a semi-conscious state of relaxation (deep sedation). This is because the amount of sedative needed to produce this state can be hard to predict. Too much of a sedative can produce general anesthesia. Monitored anesthesia care is performed by one or more caregivers who have special training in all types of anesthesia. You will need to meet with these caregivers before your procedure. During this meeting, they will ask you about your medical history. They will also give you instructions to follow. (For example, you will need to stop eating and drinking before your procedure. You may also need to stop or change medications you are taking.) During your procedure, your caregivers will stay with you. They will:   Watch your condition. This includes watching you blood pressure, breathing, and level of pain.   Diagnose and treat problems that occur.   Give medications if they are needed. These may include calming medications (sedatives) and anesthetics.   Make sure you are comfortable.  Having monitored anesthesia care does not necessarily mean that you will be under anesthesia. It does mean that your caregivers will be able to manage anesthesia if you need it or if it occurs. It also means that you will be able to have a different type of anesthesia than you are having if you need it. When your procedure is complete, your  caregivers will continue to watch your condition. They will make sure any medications wear off before you are allowed to go home.  Document Released: 03/29/2005 Document Revised: 10/28/2012 Document Reviewed: 08/14/2012 [facility] Patient Information 2014 [place], [place].

## 2016-03-22 NOTE — Progress Notes (Signed)
Subjective:    Patient ID: [name] [name], male    DOB: [birth year], PCP [name], MD   Past Medical History:  Diagnosis Date  . Arthritis   . Bell palsy   . CAD (coronary artery disease)    a. 2014 MV: abnl w/ infap ischemia; b. 03/2013 Cath: aneurysmal bleb in the LAD w/ otw nonobs dzs-->Med Rx.  . Chronic back pain   . Chronic knee pain    a. 09/2015 s/p R TKA.  Marland Kitchen Chronic pain   . Chronic shoulder pain   . Chronic sinusitis   . CKD (chronic kidney disease), stage IV ([place])    stage 4 per office visit note of Dr Lowanda Foster on 05/2015   . COPD (chronic obstructive pulmonary disease) ([place])   . Essential hypertension   . GERD (gastroesophageal reflux disease)   . Hepatomegaly    noted on noncontrast CT 2015  . Hyperlipidemia   . Lateral meniscus tear   . Obesity    [place]  . Obstructive sleep apnea    cpap - settings at 4   . On home oxygen therapy    uses 2l when is going somewhere per patient   . Post traumatic stress disorder   . PUD (peptic ulcer disease)    remote, reports f/u EGD about 8 years ago unremarkable   . Reactive airway disease    related to exposure to chemical during 9/11  . Sinusitis   . Vitamin D deficiency    Past Surgical History:  Procedure Laterality Date  . ASAD LT SHOULDER  12/2008   left shoulder  . COLONOSCOPY  10/2008   Fields: Rectal polyp obliterated, not retrieved, hemorrhoids, single ascending colon diverticulum near the CV. Next colonoscopy April 2020  . COLONOSCOPY N/A 12/25/2014   SLF: 1. Colorectal polyps (2) removed 2. Small internal hemorrhoids 3. the left colon is severely redundant  . DOPPLER ECHOCARDIOGRAPHY    . ESOPHAGOGASTRODUODENOSCOPY N/A 12/25/2014   SLF: 1. Anemia most likely due to CRI, gastritis, gastric polyps 2. Moderate non-erosive gastriits and mild duodenitis.  3.TWo large gstric polyps removed.   Marland Kitchen EYE SURGERY  12/22/2010   tear duct probing-[place]  . FOREIGN BODY REMOVAL  03/29/2011   Procedure: REMOVAL  FOREIGN BODY EXTREMITY;  Surgeon: Arther Abbott, MD;  Location: [facility];  Service: Orthopedics;  Laterality: Right;  Removal Foreign Body Right Thumb  . KNEE ARTHROSCOPY  10/2007   left  . KNEE ARTHROSCOPY WITH LATERAL MENISECTOMY Right 10/14/2015   Procedure: LEFT KNEE ARTHROSCOPY WITH PARTIAL LATERAL MENISECTOMY;  Surgeon: Carole Civil, MD;  Location: [facility];  Service: Orthopedics;  Laterality: Right;  . LEFT HEART CATHETERIZATION WITH CORONARY ANGIOGRAM N/A 03/28/2013   Procedure: LEFT HEART CATHETERIZATION WITH CORONARY ANGIOGRAM;  Surgeon: Leonie Man, MD;  Location: [facility] CATH LAB;  Service: Cardiovascular;  Laterality: N/A;  . NM MYOVIEW LTD    . PENILE PROSTHESIS IMPLANT N/A 08/16/2015   Procedure: PENILE PROTHESIS INFLATABLE, three piece, Excisional biopsy of Penile ulcer, Penile molding;  Surgeon: Carolan Clines, MD;  Location: [facility];  Service: Urology;  Laterality: N/A;  . QUADRICEPS TENDON REPAIR  07/21/2011   Procedure: REPAIR QUADRICEP TENDON;  Surgeon: Arther Abbott, MD;  Location: [facility];  Service: Orthopedics;  Laterality: Right;  . TOENAIL EXCISION     removed [id number]  . UMBILICAL HERNIA REPAIR  2007   [place]   Social History   Social History  . Marital status: Married    Spouse  name: N/A  . Number of children: 2  . Years of education: 12th grade   Occupational History  . disabled   .     Marland Kitchen  Unemployed   Social History Main Topics  . Smoking status: Former Smoker    Packs/day: 1.00    Years: 25.00    Types: Cigarettes    Quit date: 03/27/2010  . Smokeless tobacco: Never Used     Comment: Quit x 7 years  . Alcohol use 0.0 oz/week     Comment: occasionally  . Drug use:     Types: Cocaine, Marijuana     Comment: stopped feb2011  . Sexual activity: Not Asked   Other Topics Concern  . None   Social History Narrative   He quit smoking in 2010. 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 He developed  pulmonary problems, became disabled because of lower airway disease in 2009.    Outpatient Encounter Prescriptions as of 03/22/2016  Medication Sig  . albuterol (PROVENTIL HFA) 108 (90 BASE) MCG/ACT inhaler Inhale 2 puffs into the lungs every 4 (four) hours as needed for wheezing.   Marland Kitchen amLODipine (NORVASC) 10 MG tablet Take 10 mg by mouth daily.   . budesonide-formoterol (SYMBICORT) 160-4.5 MCG/ACT inhaler Inhale 2 puffs into the lungs daily as needed (Shortness of Breath).   . cetirizine (ZYRTEC) 10 MG tablet Take 10 mg by mouth daily as needed for allergies.   . Cholecalciferol (VITAMIN D3) 5000 units CAPS Take 1 capsule (5,000 Units total) by mouth daily.  . clopidogrel (PLAVIX) 75 MG tablet Take 75 mg by mouth daily.  . hydrALAZINE (APRESOLINE) 10 MG tablet Take 1 tablet (10 mg total) by mouth 3 (three) times daily. Keep appointment.  . Insulin Glargine (LANTUS SOLOSTAR) 100 UNIT/ML Solostar Pen Inject 40 Units into the skin daily at 10 pm.  . iron polysaccharides (NIFEREX) 150 MG capsule Take 150 mg by mouth daily.  . mometasone (NASONEX) 50 MCG/ACT nasal spray Place 2 sprays into the nose daily as needed (allergies).   . pantoprazole (PROTONIX) 40 MG tablet 1 PO 30 MINS PRIOR TO MEALS BID  . simvastatin (ZOCOR) 20 MG tablet Take 20 mg by mouth every morning.   . TRADJENTA 5 MG TABS tablet TAKE 1 TABLET EVERY DAY  . [DISCONTINUED] LANTUS SOLOSTAR 100 UNIT/ML Solostar Pen INJECT  50 UNITS SUBCUTANEOUSLY AT BEDTIME   No facility-administered encounter medications on file as of 03/22/2016.    ALLERGIES: Allergies  Allergen Reactions  . Opana [Oxymorphone Hcl] Itching  . Tramadol Itching   VACCINATION STATUS: Immunization History  Administered Date(s) Administered  . Td 03/27/2011    Diabetes  He presents for his follow-up diabetic visit. He has type 2 diabetes mellitus. Onset time: He was diagnosed approximately 20 years ago. His disease course has been improving. There are no  hypoglycemic associated symptoms. Pertinent negatives for hypoglycemia include no confusion, headaches, pallor or seizures. Associated symptoms include polyuria. Pertinent negatives for diabetes include no chest pain, no fatigue, no polydipsia, no polyphagia and no weakness. There are no hypoglycemic complications. Symptoms are improving. Diabetic complications include nephropathy. Risk factors for coronary artery disease include dyslipidemia, diabetes mellitus, hypertension, obesity, sedentary lifestyle, tobacco exposure and male sex. Current diabetic treatment includes insulin injections and oral agent (monotherapy). He is compliant with treatment most of the time. He is following a generally unhealthy diet. He has had a previous visit with a dietitian. He rarely participates in  exercise. His breakfast blood glucose range is generally 140-180 mg/dl. Eye exam is current.  Hypertension  This is a chronic problem. The current episode started more than 1 year ago. The problem is controlled. Pertinent negatives include no chest pain, headaches, neck pain, palpitations or shortness of breath. Risk factors for coronary artery disease include diabetes mellitus, dyslipidemia, obesity, smoking/tobacco exposure and sedentary lifestyle. Past treatments include angiotensin blockers. Hypertensive end-organ damage includes kidney disease.  Hyperlipidemia  This is a chronic problem. The current episode started more than 1 year ago. Pertinent negatives include no chest pain, myalgias or shortness of breath. Current antihyperlipidemic treatment includes statins. Risk factors for coronary artery disease include diabetes mellitus, dyslipidemia, hypertension, male sex and a sedentary lifestyle.     Review of Systems  Constitutional: Negative for fatigue and unexpected weight change.  HENT: Negative for dental problem, mouth sores and trouble swallowing.   Eyes: Negative for visual disturbance.  Respiratory: Negative for  cough, choking, chest tightness, shortness of breath and wheezing.   Cardiovascular: Negative for chest pain, palpitations and leg swelling.  Gastrointestinal: Negative for abdominal distention, abdominal pain, constipation, diarrhea, nausea and vomiting.  Endocrine: Positive for polyuria. Negative for polydipsia and polyphagia.  Genitourinary: Negative for dysuria, flank pain, hematuria and urgency.  Musculoskeletal: Negative for back pain, gait problem, myalgias and neck pain.  Skin: Negative for pallor, rash and wound.  Neurological: Negative for seizures, syncope, weakness, numbness and headaches.  Psychiatric/Behavioral: Negative.  Negative for confusion and dysphoric mood.    Objective:    BP 136/87   Pulse 80   Ht 5\' 4"  (1.626 m)   Wt 246 lb (111.6 kg)   BMI 42.23 kg/m   Wt Readings from Last 3 Encounters:  03/22/16 246 lb (111.6 kg)  03/04/16 244 lb (110.7 kg)  01/31/16 242 lb (109.8 kg)    Physical Exam  Constitutional: He is oriented to person, place, and time. He appears well-developed and well-nourished. He is cooperative. No distress.  HENT:  Head: Normocephalic and atraumatic.  Eyes: EOM are normal.  Neck: Normal range of motion. Neck supple. No tracheal deviation present. No thyromegaly present.  Cardiovascular: Normal rate, S1 normal, S2 normal and normal heart sounds.  Exam reveals no gallop.   No murmur heard. Pulses:      Dorsalis pedis pulses are 1+ on the right side, and 1+ on the left side.       Posterior tibial pulses are 1+ on the right side, and 1+ on the left side.  Pulmonary/Chest: Breath sounds normal. No respiratory distress. He has no wheezes.  Abdominal: Soft. Bowel sounds are normal. He exhibits no distension. There is no tenderness. There is no guarding and no CVA tenderness.  Musculoskeletal: He exhibits no edema.       Right shoulder: He exhibits no swelling and no deformity.  Neurological: He is alert and oriented to person, place, and time.  He has normal strength and normal reflexes. No cranial nerve deficit or sensory deficit. Gait normal.  Skin: Skin is warm and dry. No rash noted. No cyanosis. Nails show no clubbing.  Psychiatric: He has a normal mood and affect. His speech is normal and behavior is normal. Judgment and thought content normal. Cognition and memory are normal.    Results for orders placed or performed in visit on 03/13/16  COMPLETE METABOLIC PANEL WITH GFR  Result Value Ref Range   Sodium 140 135 - 146 mmol/L   Potassium 4.0 3.5 - 5.3 mmol/L  Chloride 106 98 - 110 mmol/L   CO2 26 20 - 31 mmol/L   Glucose, Bld 146 (H) 65 - 99 mg/dL   BUN 32 (H) 7 - 25 mg/dL   Creat 3.41 (H) 0.70 - 1.25 mg/dL   Total Bilirubin 0.4 0.2 - 1.2 mg/dL   Alkaline Phosphatase 51 40 - 115 U/L   AST 14 10 - 35 U/L   ALT 14 9 - 46 U/L   Total Protein 7.0 6.1 - 8.1 g/dL   Albumin 3.9 3.6 - 5.1 g/dL   Calcium 8.8 8.6 - 10.3 mg/dL   GFR, Est African American 21 (L) >=60 mL/min   GFR, Est Non African American 18 (L) >=60 mL/min  Hemoglobin A1c  Result Value Ref Range   Hgb A1c MFr Bld 6.8 (H) <5.7 %   Mean Plasma Glucose 148 mg/dL   Diabetic Labs (most recent): Lab Results  Component Value Date   HGBA1C 6.8 (H) 03/13/2016   HGBA1C 7.4 (H) 12/03/2015   HGBA1C 7.1 (H) 09/04/2015   Lipid Panel     Component Value Date/Time   CHOL 129 05/14/2015 0855   TRIG 108 05/14/2015 0855   HDL 35 (L) 05/14/2015 0855   CHOLHDL 3.7 05/14/2015 0855   VLDL 22 05/14/2015 0855   LDLCALC 72 05/14/2015 0855     Assessment & Plan:   1. Type 2 diabetes mellitus with stage 4 chronic kidney disease, with long-term current use of insulin (HCC)  -His  Diabetes is complicated by chronic kidney disease and patient remains at a high risk for more acute and chronic complications of diabetes which include CAD, CVA, CKD, retinopathy, and neuropathy. These are all discussed in detail with the patient.  Patient came with controlled fasting glucose  profile, and  recent A1c of   6.8% improving from 7.4% last visit.  Glucose logs and insulin administration records pertaining to this visit,  to be scanned into patient's records.  Recent labs reviewed.   - I have re-counseled the patient on diet management and weight loss  by adopting a carbohydrate restricted / protein rich  Diet.  - Suggestion is made for patient to avoid simple carbohydrates   from their diet including Cakes , Desserts, Ice Cream,  Soda (  diet and regular) , Sweet Tea , Candies,  Chips, Cookies, Artificial Sweeteners,   and "Sugar-free" Products .  This will help patient to have stable blood glucose profile and potentially avoid unintended  Weight gain.  - Patient is advised to stick to a routine mealtimes to eat 3 meals  a day and avoid unnecessary snacks ( to snack only to correct hypoglycemia). I have also advised him to cut back on alcohol consumption.  - The patient  has been  scheduled with Jearld Fenton, RDN, CDE for individualized DM education.  - I have approached patient with the following individualized plan to manage diabetes and patient agrees.   -He is advised to stay away from ETOH and smoking. He will continue to need at least basal insulin therapy.  -I will lower her Lantus to 40 units  qhs, off of Humalog . Continue tradjenta 5mg  po qday.   He is following with Jearld Fenton, CDE for DM education.  He will be monitoring of BG BID.  -He will continue to f/u with Nephrology given stage 4 CKD ( improving),  even after his DM is controlled.  -Patient is not a candidate for metformin and SGLT2 inhibitors due to CKD. His creatinine  increasing to 4, I advised him to stay in close follow-up with nephrology.  - Patient specific target  for A1c; LDL, HDL, Triglycerides, and  Waist Circumference were discussed in detail.  2) BP/HTN: Controlled. Continue current medications including ACEI/ARB. 3) Lipids/HPL:  continue statins. 4)  Weight/Diet: CDE consult  in progress, exercise, and carbohydrates information provided. 5) Vitamin D deficiency -I advised him to continue vitamin D 5,000 units daily. His insurance would not cover for repeat vitamin D test.   6) Chronic Care/Health Maintenance:  -Patient is on ACEI/ARB and Statin medications and encouraged to continue to follow up with Ophthalmology, Podiatrist at least yearly or according to recommendations, and advised to quit smoking. I have recommended yearly flu vaccine and pneumonia vaccination at least every 5 years; moderate intensity exercise for up to 150 minutes weekly; and  sleep for at least 7 hours a day.  - 25 minutes of time was spent on the care of this patient , 50% of which was applied for counseling on diabetes complications and their preventions.  - I advised patient to maintain close follow up with their PCP for primary care needs. - Patient is asked to bring meter and  blood glucose logs during their next visit.   Follow up plan: -Return in about 3 months (around 06/21/2016) for follow up with pre-visit labs, meter, and logs.  Glade Lloyd, MD Phone: [phone]  Fax: [phone]   03/22/2016, 9:21 AM

## 2016-03-23 ENCOUNTER — Other Ambulatory Visit: Payer: Self-pay

## 2016-03-23 ENCOUNTER — Encounter (HOSPITAL_COMMUNITY)
Admission: RE | Admit: 2016-03-23 | Discharge: 2016-03-23 | Disposition: A | Discharge status: Home / Self Care | Payer: Medicare Other | Source: Ambulatory Visit | Attending: Ophthalmology | Admitting: Ophthalmology

## 2016-03-23 ENCOUNTER — Encounter (HOSPITAL_COMMUNITY): Payer: Self-pay

## 2016-03-23 DIAGNOSIS — Z01812 Encounter for preprocedural laboratory examination: Secondary | ICD-10-CM | POA: Insufficient documentation

## 2016-03-23 HISTORY — DX: Type 2 diabetes mellitus without complications: E11.9

## 2016-03-23 LAB — RAPID URINE DRUG SCREEN, HOSP PERFORMED
AMPHETAMINES: NOT DETECTED
BENZODIAZEPINES: NOT DETECTED
Barbiturates: NOT DETECTED
Cocaine: NOT DETECTED
OPIATES: NOT DETECTED
Tetrahydrocannabinol: NOT DETECTED

## 2016-03-27 MED ORDER — KETOROLAC TROMETHAMINE 0.5 % OP SOLN
OPHTHALMIC | Status: AC
  Filled 2016-03-27: qty 5

## 2016-03-27 MED ORDER — CYCLOPENTOLATE-PHENYLEPHRINE 0.2-1 % OP SOLN
OPHTHALMIC | Status: AC
  Filled 2016-03-27: qty 2

## 2016-03-27 MED ORDER — TETRACAINE HCL 0.5 % OP SOLN
OPHTHALMIC | Status: AC
Start: 2016-03-27 — End: 2016-03-27
  Filled 2016-03-27: qty 4

## 2016-03-27 MED ORDER — PHENYLEPHRINE HCL 2.5 % OP SOLN
OPHTHALMIC | Status: AC
  Filled 2016-03-27: qty 15

## 2016-03-28 ENCOUNTER — Encounter (HOSPITAL_COMMUNITY): Payer: Self-pay | Admitting: *Deleted

## 2016-03-28 ENCOUNTER — Ambulatory Visit (HOSPITAL_COMMUNITY): Payer: Medicare Other | Admitting: Anesthesiology

## 2016-03-28 ENCOUNTER — Encounter (HOSPITAL_COMMUNITY)
Admission: RE | Disposition: A | Discharge status: Home / Self Care | Payer: Self-pay | Source: Ambulatory Visit | Attending: Ophthalmology

## 2016-03-28 ENCOUNTER — Ambulatory Visit (HOSPITAL_COMMUNITY)
Admission: RE | Admit: 2016-03-28 | Discharge: 2016-03-28 | Disposition: A | Discharge status: Home / Self Care | Payer: Medicare Other | Source: Ambulatory Visit | Attending: Ophthalmology | Admitting: Ophthalmology

## 2016-03-28 DIAGNOSIS — I2510 Atherosclerotic heart disease of native coronary artery without angina pectoris: Secondary | ICD-10-CM | POA: Diagnosis not present

## 2016-03-28 DIAGNOSIS — G4730 Sleep apnea, unspecified: Secondary | ICD-10-CM | POA: Insufficient documentation

## 2016-03-28 DIAGNOSIS — Z7951 Long term (current) use of inhaled steroids: Secondary | ICD-10-CM | POA: Insufficient documentation

## 2016-03-28 DIAGNOSIS — K219 Gastro-esophageal reflux disease without esophagitis: Secondary | ICD-10-CM | POA: Insufficient documentation

## 2016-03-28 DIAGNOSIS — H269 Unspecified cataract: Secondary | ICD-10-CM | POA: Diagnosis not present

## 2016-03-28 DIAGNOSIS — J449 Chronic obstructive pulmonary disease, unspecified: Secondary | ICD-10-CM | POA: Diagnosis not present

## 2016-03-28 DIAGNOSIS — Z79899 Other long term (current) drug therapy: Secondary | ICD-10-CM | POA: Insufficient documentation

## 2016-03-28 DIAGNOSIS — E119 Type 2 diabetes mellitus without complications: Secondary | ICD-10-CM | POA: Diagnosis not present

## 2016-03-28 DIAGNOSIS — M1990 Unspecified osteoarthritis, unspecified site: Secondary | ICD-10-CM | POA: Insufficient documentation

## 2016-03-28 DIAGNOSIS — H2511 Age-related nuclear cataract, right eye: Secondary | ICD-10-CM | POA: Diagnosis not present

## 2016-03-28 DIAGNOSIS — Z87891 Personal history of nicotine dependence: Secondary | ICD-10-CM | POA: Insufficient documentation

## 2016-03-28 DIAGNOSIS — H25011 Cortical age-related cataract, right eye: Secondary | ICD-10-CM | POA: Diagnosis not present

## 2016-03-28 DIAGNOSIS — Z794 Long term (current) use of insulin: Secondary | ICD-10-CM | POA: Diagnosis not present

## 2016-03-28 DIAGNOSIS — I10 Essential (primary) hypertension: Secondary | ICD-10-CM | POA: Insufficient documentation

## 2016-03-28 DIAGNOSIS — Z7902 Long term (current) use of antithrombotics/antiplatelets: Secondary | ICD-10-CM | POA: Diagnosis not present

## 2016-03-28 DIAGNOSIS — H25012 Cortical age-related cataract, left eye: Secondary | ICD-10-CM | POA: Diagnosis not present

## 2016-03-28 DIAGNOSIS — H2512 Age-related nuclear cataract, left eye: Secondary | ICD-10-CM | POA: Diagnosis not present

## 2016-03-28 HISTORY — PX: CATARACT EXTRACTION W/PHACO: SHX586

## 2016-03-28 LAB — GLUCOSE, CAPILLARY: GLUCOSE-CAPILLARY: 154 mg/dL — AB (ref 65–99)

## 2016-03-28 SURGERY — PHACOEMULSIFICATION, CATARACT, WITH IOL INSERTION
Anesthesia: Monitor Anesthesia Care | Site: Eye | Laterality: Left

## 2016-03-28 MED ORDER — MIDAZOLAM HCL 2 MG/2ML IJ SOLN
2.0000 mg | Freq: Once | INTRAMUSCULAR | Status: AC
  Administered 2016-03-28: 2 mg via INTRAVENOUS

## 2016-03-28 MED ORDER — EPINEPHRINE HCL 1 MG/ML IJ SOLN
INTRAOCULAR | Status: DC | PRN
  Administered 2016-03-28: 500 mL

## 2016-03-28 MED ORDER — LACTATED RINGERS IV SOLN
INTRAVENOUS | Status: DC | PRN
  Administered 2016-03-28: 07:00:00 via INTRAVENOUS

## 2016-03-28 MED ORDER — CYCLOPENTOLATE-PHENYLEPHRINE 0.2-1 % OP SOLN
1.0000 [drp] | OPHTHALMIC | Status: AC
  Administered 2016-03-28 (×3): 1 [drp] via OPHTHALMIC

## 2016-03-28 MED ORDER — PROVISC 10 MG/ML IO SOLN
INTRAOCULAR | Status: DC | PRN
  Administered 2016-03-28: 0.85 mL via INTRAOCULAR

## 2016-03-28 MED ORDER — KETOROLAC TROMETHAMINE 0.5 % OP SOLN
1.0000 [drp] | OPHTHALMIC | Status: AC
  Administered 2016-03-28 (×3): 1 [drp] via OPHTHALMIC

## 2016-03-28 MED ORDER — BSS IO SOLN
INTRAOCULAR | Status: DC | PRN
  Administered 2016-03-28: 15 mL

## 2016-03-28 MED ORDER — EPINEPHRINE HCL 1 MG/ML IJ SOLN
INTRAMUSCULAR | Status: AC
  Filled 2016-03-28: qty 1

## 2016-03-28 MED ORDER — LACTATED RINGERS IV SOLN
Freq: Once | INTRAVENOUS | Status: AC
  Administered 2016-03-28: 1000 mL via INTRAVENOUS

## 2016-03-28 MED ORDER — FENTANYL CITRATE (PF) 100 MCG/2ML IJ SOLN
25.0000 ug | Freq: Once | INTRAMUSCULAR | Status: AC
  Administered 2016-03-28: 25 ug via INTRAVENOUS

## 2016-03-28 MED ORDER — PHENYLEPHRINE HCL 2.5 % OP SOLN
1.0000 [drp] | OPHTHALMIC | Status: AC
  Administered 2016-03-28 (×3): 1 [drp] via OPHTHALMIC

## 2016-03-28 MED ORDER — TETRACAINE HCL 0.5 % OP SOLN
1.0000 [drp] | OPHTHALMIC | Status: AC
  Administered 2016-03-28 (×3): 1 [drp] via OPHTHALMIC

## 2016-03-28 MED ORDER — TETRACAINE 0.5 % OP SOLN OPTIME - NO CHARGE
OPHTHALMIC | Status: DC | PRN
  Administered 2016-03-28: 2 [drp] via OPHTHALMIC

## 2016-03-28 MED ORDER — SEVOFLURANE IN SOLN
RESPIRATORY_TRACT | Status: AC
  Filled 2016-03-28: qty 250

## 2016-03-28 MED ORDER — FENTANYL CITRATE (PF) 100 MCG/2ML IJ SOLN
INTRAMUSCULAR | Status: AC
  Filled 2016-03-28: qty 2

## 2016-03-28 MED ORDER — MIDAZOLAM HCL 2 MG/2ML IJ SOLN
INTRAMUSCULAR | Status: AC
  Filled 2016-03-28: qty 2

## 2016-03-28 SURGICAL SUPPLY — 10 items
CLOTH BEACON ORANGE TIMEOUT ST (SAFETY) ×1 IMPLANT
EYE SHIELD UNIVERSAL CLEAR (GAUZE/BANDAGES/DRESSINGS) ×1 IMPLANT
GLOVE BIOGEL PI IND STRL 6.5 (GLOVE) IMPLANT
GLOVE BIOGEL PI INDICATOR 6.5 (GLOVE) ×1
GLOVE EXAM NITRILE MD LF STRL (GLOVE) ×1 IMPLANT
LENS ALC ACRYL/TECN (Ophthalmic Related) ×2 IMPLANT
PAD ARMBOARD 7.5X6 YLW CONV (MISCELLANEOUS) ×1 IMPLANT
TAPE SURG TRANSPORE 1 IN (GAUZE/BANDAGES/DRESSINGS) IMPLANT
TAPE SURGICAL TRANSPORE 1 IN (GAUZE/BANDAGES/DRESSINGS) ×1
WATER STERILE IRR 250ML POUR (IV SOLUTION) ×1 IMPLANT

## 2016-03-28 NOTE — Discharge Instructions (Signed)
[facility] Instructions [place] [address]      1. Avoid closing eyes tightly. One often closes the eye tightly when laughing, talking, sneezing, coughing or if they feel irritated. At these times, you should be careful not to close your eyes tightly.  2. Instill eye drops as instructed. To instill drops in your eye, open it, look up and have someone gently pull the lower lid down and instill a couple of drops inside the lower lid.  3. Do not touch upper lid.  4. Take Advil or Tylenol for pain.  5. You may use either eye for near work, such as reading or sewing and you may watch television.  6. You may have your hair done at the beauty parlor at any time.  7. Wear dark glasses with or without your own glasses if you are in bright light.  8. Call our office at [phone] or [phone] if you have sharp pain in your eye or unusual symptoms.  9.  FOLLOW UP WITH DR. SHAPIRO TODAY IN HIS [place] OFFICE AT 2:45pm.    I have received a copy of the above instructions and will follow them.     IF YOU ARE IN IMMEDIATE DANGER CALL 911!  It is important for you to keep your follow-up appointment with your physician after discharge, OR, for you /your caregiver to make a follow-up appointment with your physician / medical provider after discharge.  Show these instructions to the next healthcare provider you see. Cataract Surgery, Care After Refer to this sheet in the next few weeks. These instructions provide you with information on caring for yourself after your procedure. Your caregiver may also give you more specific instructions. Your treatment has been planned according to current medical practices, but problems sometimes occur. Call your caregiver if you have any problems or questions after your procedure.  HOME CARE INSTRUCTIONS   Avoid strenuous activities as directed by your caregiver.  Ask your caregiver  when you can resume driving.  Use eyedrops or other medicines to help healing and control pressure inside your eye as directed by your caregiver.  Only take over-the-counter or prescription medicines for pain, discomfort, or fever as directed by your caregiver.  Do not to touch or rub your eyes.  You may be instructed to use a protective shield during the first few days and nights after surgery. If not, wear sunglasses to protect your eyes. This is to protect the eye from pressure or from being accidentally bumped.  Keep the area around your eye clean and dry. Avoid swimming or allowing water to hit you directly in the face while showering. Keep soap and shampoo out of your eyes.  Do not bend or lift heavy objects. Bending increases pressure in the eye. You can walk, climb stairs, and do light household chores.  Do not put a contact lens into the eye that had surgery until your caregiver says it is okay to do so.  Ask your doctor when you can return to work. This will depend on the kind of work that you do. If you work in a dusty environment, you may be advised to wear protective eyewear for a period of time.  Ask your caregiver when it will be safe to engage in sexual activity.  Continue with your regular eye exams as directed by your caregiver. What to expect:  It is normal  to feel itching and mild discomfort for a few days after cataract surgery. Some fluid discharge is also common, and your eye may be sensitive to light and touch.  After 1 to 2 days, even moderate discomfort should disappear. In most cases, healing will take about 6 weeks.  If you received an intraocular lens (IOL), you may notice that colors are very bright or have a blue tinge. Also, if you have been in bright sunlight, everything may appear reddish for a few hours. If you see these color tinges, it is because your lens is clear and no longer cloudy. Within a few months after receiving an IOL, these extra colors  should go away. When you have healed, you will probably need new glasses. SEEK MEDICAL CARE IF:   You have increased bruising around your eye.  You have discomfort not helped by medicine. SEEK IMMEDIATE MEDICAL CARE IF:   You have a fever.  You have a worsening or sudden vision loss.  You have redness, swelling, or increasing pain in the eye.  You have a thick discharge from the eye that had surgery. MAKE SURE YOU:  Understand these instructions.  Will watch your condition.  Will get help right away if you are not doing well or get worse.   This information is not intended to replace advice given to you by your health care provider. Make sure you discuss any questions you have with your health care provider.   Document Released: 01/20/2005 Document Revised: 07/24/2014 Document Reviewed: 02/24/2011 Elsevier Interactive Patient Education [employer].

## 2016-03-28 NOTE — Transfer of Care (Signed)
Immediate Anesthesia Transfer of Care Note  Patient: [name] [name]  Procedure(s) Performed: Procedure(s) (LRB): CATARACT EXTRACTION PHACO AND INTRAOCULAR LENS PLACEMENT LEFT EYE (Left)  Patient Location: Shortstay  Anesthesia Type: MAC  Level of Consciousness: awake  Airway & Oxygen Therapy: Patient Spontanous Breathing   Post-op Assessment: Report given to PACU RN, Post -op Vital signs reviewed and stable and Patient moving all extremities  Post vital signs: Reviewed and stable  Complications: No apparent anesthesia complications

## 2016-03-28 NOTE — Anesthesia Procedure Notes (Signed)
Procedure Name: MAC Date/Time: 03/28/2016 7:28 AM Performed by: Vista Deck Pre-anesthesia Checklist: Patient identified, Emergency Drugs available, Suction available, Timeout performed and Patient being monitored Patient Re-evaluated:Patient Re-evaluated prior to inductionOxygen Delivery Method: Nasal Cannula

## 2016-03-28 NOTE — Op Note (Signed)
Patient brought to the operating room and prepped and draped in the usual manner.  Lid speculum inserted in left eye.  Stab incision made at the twelve o'clock position.  Provisc instilled in the anterior chamber.   A 2.4 mm. Stab incision was made temporally.  An anterior capsulotomy was done with a bent 25 gauge needle.  The nucleus was hydrodissected.  The Phaco tip was inserted in the anterior chamber and the nucleus was emulsified.  CDE was 4.77.  The cortical material was then removed with the I and A tip.  Posterior capsule was the polished.  The anterior chamber was deepened with Provisc.  A 17.0 Diopter Alcon [id number] IOL was then inserted in the capsular bag.  Provisc was then removed with the I and A tip.  The wound was then hydrated.  Patient sent to the Recovery Room in good condition with follow up in my office.  Preoperative Diagnosis:  Nuclear Cataract OS Postoperative Diagnosis:  Same Procedure name: Kelman Phacoemulsification OS with IOL

## 2016-03-28 NOTE — Anesthesia Postprocedure Evaluation (Signed)
Anesthesia Post Note  Patient: [name] [name]  Procedure(s) Performed: Procedure(s) (LRB): CATARACT EXTRACTION PHACO AND INTRAOCULAR LENS PLACEMENT LEFT EYE (Left)  Anesthesia Post Evaluation  Anesthesia Post-op Note  Patient: [name] [name]  Procedure(s) Performed: Procedure(s) (LRB): CATARACT EXTRACTION PHACO AND INTRAOCULAR LENS PLACEMENT LEFT EYE (Left)  Patient Location:  Short Stay  Anesthesia Type: MAC  Level of Consciousness: awake  Airway and Oxygen Therapy: Patient Spontanous Breathing  Post-op Pain: none  Post-op Assessment: Post-op Vital signs reviewed, Patient's Cardiovascular Status Stable, Respiratory Function Stable, Patent Airway, No signs of Nausea or vomiting and Pain level controlled  Post-op Vital Signs: Reviewed and stable  Complications: No apparent anesthesia complications Last Vitals:  Vitals:   03/28/16 0715 03/28/16 0720  BP: 138/82 128/81  Pulse:    Resp: 19 (!) 21  Temp:      Last Pain:  Vitals:   03/28/16 0650  TempSrc: Oral                 Alycen Mack

## 2016-03-28 NOTE — Addendum Note (Signed)
Addendum  created 03/28/16 0749 by Vista Deck, CRNA   Anesthesia Intra Flowsheets edited

## 2016-03-28 NOTE — Anesthesia Preprocedure Evaluation (Signed)
Anesthesia Evaluation  Patient identified by MRN, date of birth, ID band Patient awake    Reviewed: Allergy & Precautions, NPO status , Patient's Chart, lab work & pertinent test results  Airway Mallampati: II   Neck ROM: full    Dental   Pulmonary sleep apnea , COPD, former smoker,    breath sounds clear to auscultation       Cardiovascular hypertension, + CAD   Rhythm:regular Rate:Normal  Non-obstructive CAD   Neuro/Psych  Neuromuscular disease    GI/Hepatic PUD, GERD  ,  Endo/Other  diabetes, Type 2  Renal/GU Renal InsufficiencyRenal disease     Musculoskeletal  (+) Arthritis ,   Abdominal   Peds  Hematology   Anesthesia Other Findings   Reproductive/Obstetrics                             Anesthesia Physical Anesthesia Plan  ASA: III  Anesthesia Plan: MAC   Post-op Pain Management:    Induction: Intravenous  Airway Management Planned: Nasal Cannula  Additional Equipment:   Intra-op Plan:   Post-operative Plan:   Informed Consent: I have reviewed the patients History and Physical, chart, labs and discussed the procedure including the risks, benefits and alternatives for the proposed anesthesia with the patient or authorized representative who has indicated his/her understanding and acceptance.     Plan Discussed with: CRNA, Anesthesiologist and Surgeon  Anesthesia Plan Comments:         Anesthesia Quick Evaluation

## 2016-03-28 NOTE — H&P (Signed)
The patient was re examined and there is no change in the patients condition since the original H and P. 

## 2016-03-31 ENCOUNTER — Encounter (HOSPITAL_COMMUNITY): Payer: Self-pay | Admitting: Ophthalmology

## 2016-04-05 DIAGNOSIS — N183 Chronic kidney disease, stage 3 (moderate): Secondary | ICD-10-CM | POA: Diagnosis not present

## 2016-04-05 DIAGNOSIS — E291 Testicular hypofunction: Secondary | ICD-10-CM | POA: Diagnosis not present

## 2016-04-05 DIAGNOSIS — N5201 Erectile dysfunction due to arterial insufficiency: Secondary | ICD-10-CM | POA: Diagnosis not present

## 2016-04-07 ENCOUNTER — Encounter (HOSPITAL_COMMUNITY)
Admission: RE | Admit: 2016-04-07 | Discharge: 2016-04-07 | Disposition: A | Discharge status: Home / Self Care | Payer: Medicare Other | Source: Ambulatory Visit | Attending: Ophthalmology | Admitting: Ophthalmology

## 2016-04-10 MED ORDER — ONDANSETRON HCL 4 MG/2ML IJ SOLN: 4.0000 mg | Freq: Four times a day (QID) | INTRAMUSCULAR | Status: DC | PRN

## 2016-04-10 MED ORDER — OXYCODONE HCL 5 MG/5ML PO SOLN: 5.0000 mg | Freq: Once | ORAL | Status: DC | PRN

## 2016-04-10 MED ORDER — OXYCODONE HCL 5 MG PO TABS: 5.0000 mg | ORAL_TABLET | Freq: Once | ORAL | Status: DC | PRN

## 2016-04-10 MED ORDER — FENTANYL CITRATE (PF) 100 MCG/2ML IJ SOLN: 25.0000 ug | INTRAMUSCULAR | Status: DC | PRN

## 2016-04-11 ENCOUNTER — Ambulatory Visit (HOSPITAL_COMMUNITY): Payer: Medicare Other | Admitting: Anesthesiology

## 2016-04-11 ENCOUNTER — Encounter (HOSPITAL_COMMUNITY): Payer: Self-pay | Admitting: *Deleted

## 2016-04-11 ENCOUNTER — Ambulatory Visit (HOSPITAL_COMMUNITY)
Admission: RE | Admit: 2016-04-11 | Discharge: 2016-04-11 | Disposition: A | Discharge status: Home / Self Care | Payer: Medicare Other | Source: Ambulatory Visit | Attending: Ophthalmology | Admitting: Ophthalmology

## 2016-04-11 ENCOUNTER — Encounter (HOSPITAL_COMMUNITY)
Admission: RE | Disposition: A | Discharge status: Home / Self Care | Payer: Self-pay | Source: Ambulatory Visit | Attending: Ophthalmology

## 2016-04-11 DIAGNOSIS — Z794 Long term (current) use of insulin: Secondary | ICD-10-CM | POA: Diagnosis not present

## 2016-04-11 DIAGNOSIS — G4730 Sleep apnea, unspecified: Secondary | ICD-10-CM | POA: Diagnosis not present

## 2016-04-11 DIAGNOSIS — J449 Chronic obstructive pulmonary disease, unspecified: Secondary | ICD-10-CM | POA: Insufficient documentation

## 2016-04-11 DIAGNOSIS — K219 Gastro-esophageal reflux disease without esophagitis: Secondary | ICD-10-CM | POA: Insufficient documentation

## 2016-04-11 DIAGNOSIS — Z87891 Personal history of nicotine dependence: Secondary | ICD-10-CM | POA: Diagnosis not present

## 2016-04-11 DIAGNOSIS — E669 Obesity, unspecified: Secondary | ICD-10-CM | POA: Diagnosis not present

## 2016-04-11 DIAGNOSIS — Z6841 Body Mass Index (BMI) 40.0 and over, adult: Secondary | ICD-10-CM | POA: Insufficient documentation

## 2016-04-11 DIAGNOSIS — Z7951 Long term (current) use of inhaled steroids: Secondary | ICD-10-CM | POA: Diagnosis not present

## 2016-04-11 DIAGNOSIS — M1990 Unspecified osteoarthritis, unspecified site: Secondary | ICD-10-CM | POA: Insufficient documentation

## 2016-04-11 DIAGNOSIS — Z79899 Other long term (current) drug therapy: Secondary | ICD-10-CM | POA: Insufficient documentation

## 2016-04-11 DIAGNOSIS — I2510 Atherosclerotic heart disease of native coronary artery without angina pectoris: Secondary | ICD-10-CM | POA: Diagnosis not present

## 2016-04-11 DIAGNOSIS — H2511 Age-related nuclear cataract, right eye: Secondary | ICD-10-CM | POA: Diagnosis not present

## 2016-04-11 DIAGNOSIS — I10 Essential (primary) hypertension: Secondary | ICD-10-CM | POA: Diagnosis not present

## 2016-04-11 DIAGNOSIS — H269 Unspecified cataract: Secondary | ICD-10-CM | POA: Insufficient documentation

## 2016-04-11 DIAGNOSIS — E119 Type 2 diabetes mellitus without complications: Secondary | ICD-10-CM | POA: Diagnosis not present

## 2016-04-11 DIAGNOSIS — H25011 Cortical age-related cataract, right eye: Secondary | ICD-10-CM | POA: Diagnosis not present

## 2016-04-11 HISTORY — PX: CATARACT EXTRACTION W/PHACO: SHX586

## 2016-04-11 LAB — GLUCOSE, CAPILLARY: GLUCOSE-CAPILLARY: 136 mg/dL — AB (ref 65–99)

## 2016-04-11 SURGERY — PHACOEMULSIFICATION, CATARACT, WITH IOL INSERTION
Anesthesia: Monitor Anesthesia Care | Site: Eye | Laterality: Right

## 2016-04-11 MED ORDER — FENTANYL CITRATE (PF) 100 MCG/2ML IJ SOLN
25.0000 ug | INTRAMUSCULAR | Status: DC | PRN
  Administered 2016-04-11: 25 ug via INTRAVENOUS

## 2016-04-11 MED ORDER — BSS IO SOLN
INTRAOCULAR | Status: DC | PRN
  Administered 2016-04-11: 15 mL via INTRAOCULAR

## 2016-04-11 MED ORDER — MIDAZOLAM HCL 2 MG/2ML IJ SOLN
1.0000 mg | INTRAMUSCULAR | Status: DC | PRN
  Administered 2016-04-11: 2 mg via INTRAVENOUS

## 2016-04-11 MED ORDER — PHENYLEPHRINE HCL 2.5 % OP SOLN
1.0000 [drp] | OPHTHALMIC | Status: AC
  Administered 2016-04-11 (×3): 1 [drp] via OPHTHALMIC

## 2016-04-11 MED ORDER — FENTANYL CITRATE (PF) 100 MCG/2ML IJ SOLN
INTRAMUSCULAR | Status: AC
  Filled 2016-04-11: qty 2

## 2016-04-11 MED ORDER — TETRACAINE HCL 0.5 % OP SOLN
1.0000 [drp] | OPHTHALMIC | Status: AC
  Administered 2016-04-11 (×3): 1 [drp] via OPHTHALMIC

## 2016-04-11 MED ORDER — MIDAZOLAM HCL 2 MG/2ML IJ SOLN
INTRAMUSCULAR | Status: AC
  Filled 2016-04-11: qty 2

## 2016-04-11 MED ORDER — CYCLOPENTOLATE-PHENYLEPHRINE 0.2-1 % OP SOLN
1.0000 [drp] | OPHTHALMIC | Status: AC
  Administered 2016-04-11 (×3): 1 [drp] via OPHTHALMIC

## 2016-04-11 MED ORDER — TETRACAINE 0.5 % OP SOLN OPTIME - NO CHARGE
OPHTHALMIC | Status: DC | PRN
  Administered 2016-04-11: 2 [drp] via OPHTHALMIC

## 2016-04-11 MED ORDER — EPINEPHRINE HCL 1 MG/ML IJ SOLN
INTRAMUSCULAR | Status: AC
  Filled 2016-04-11: qty 1

## 2016-04-11 MED ORDER — PROVISC 10 MG/ML IO SOLN
INTRAOCULAR | Status: DC | PRN
  Administered 2016-04-11: 0.85 mL via INTRAOCULAR

## 2016-04-11 MED ORDER — KETOROLAC TROMETHAMINE 0.5 % OP SOLN
1.0000 [drp] | OPHTHALMIC | Status: AC
  Administered 2016-04-11 (×3): 1 [drp] via OPHTHALMIC

## 2016-04-11 MED ORDER — EPINEPHRINE HCL 1 MG/ML IJ SOLN
INTRAOCULAR | Status: DC | PRN
  Administered 2016-04-11: 500 mL

## 2016-04-11 MED ORDER — LACTATED RINGERS IV SOLN
INTRAVENOUS | Status: DC
  Administered 2016-04-11: 09:00:00 via INTRAVENOUS

## 2016-04-11 SURGICAL SUPPLY — 23 items
CAPSULAR TENSION RING-AMO (OPHTHALMIC RELATED) IMPLANT
CLOTH BEACON ORANGE TIMEOUT ST (SAFETY) ×1 IMPLANT
EYE SHIELD UNIVERSAL CLEAR (GAUZE/BANDAGES/DRESSINGS) ×1 IMPLANT
GLOVE BIO SURGEON STRL SZ 6.5 (GLOVE) IMPLANT
GLOVE BIOGEL PI IND STRL 7.0 (GLOVE) IMPLANT
GLOVE BIOGEL PI INDICATOR 7.0 (GLOVE) ×2
GLOVE ECLIPSE 6.5 STRL STRAW (GLOVE) IMPLANT
GLOVE EXAM NITRILE LRG STRL (GLOVE) IMPLANT
GLOVE EXAM NITRILE MD LF STRL (GLOVE) IMPLANT
HEALON 5 0.6 ML (INTRAOCULAR LENS) IMPLANT
KIT VITRECTOMY (OPHTHALMIC RELATED) IMPLANT
LENS ALC ACRYL/TECN (Ophthalmic Related) ×2 IMPLANT
PAD ARMBOARD 7.5X6 YLW CONV (MISCELLANEOUS) ×1 IMPLANT
PROC W NO LENS (INTRAOCULAR LENS)
PROC W SPEC LENS (INTRAOCULAR LENS)
PROCESS W NO LENS (INTRAOCULAR LENS) IMPLANT
PROCESS W SPEC LENS (INTRAOCULAR LENS) IMPLANT
RETRACTOR IRIS SIGHTPATH (OPHTHALMIC RELATED) IMPLANT
RING MALYGIN (MISCELLANEOUS) IMPLANT
TAPE SURG TRANSPORE 1 IN (GAUZE/BANDAGES/DRESSINGS) IMPLANT
TAPE SURGICAL TRANSPORE 1 IN (GAUZE/BANDAGES/DRESSINGS) ×1
VISCOELASTIC ADDITIONAL (OPHTHALMIC RELATED) IMPLANT
WATER STERILE IRR 250ML POUR (IV SOLUTION) ×1 IMPLANT

## 2016-04-11 NOTE — Anesthesia Postprocedure Evaluation (Signed)
Anesthesia Post Note  Patient: SYLAR VOONG  Procedure(s) Performed: Procedure(s) (LRB): CATARACT EXTRACTION PHACO AND INTRAOCULAR LENS PLACEMENT RIGHT EYE; CDE:  4.74 (Right)  Patient location during evaluation: Short Stay Anesthesia Type: MAC Level of consciousness: awake, oriented and patient cooperative Pain management: pain level controlled Vital Signs Assessment: post-procedure vital signs reviewed and stable Respiratory status: spontaneous breathing, nonlabored ventilation and respiratory function stable Cardiovascular status: blood pressure returned to baseline Postop Assessment: no signs of nausea or vomiting Anesthetic complications: no    Last Vitals:  Vitals:   04/11/16 0920 04/11/16 0925  BP: 137/83 130/81  Pulse:    Resp: (!) 21 18  Temp:      Last Pain:  Vitals:   04/11/16 0828  TempSrc: Oral  PainSc: 2                  Ava Tangney J

## 2016-04-11 NOTE — H&P (Signed)
The patient was re examined and there is no change in the patients condition since the original H and P. 

## 2016-04-11 NOTE — Anesthesia Preprocedure Evaluation (Signed)
Anesthesia Evaluation  Patient identified by MRN, date of birth, ID band Patient awake    Reviewed: Allergy & Precautions, NPO status , Patient's Chart, lab work & pertinent test results  Airway Mallampati: II   Neck ROM: full    Dental  (+) Teeth Intact   Pulmonary sleep apnea , COPD, former smoker,    breath sounds clear to auscultation       Cardiovascular hypertension, + CAD   Rhythm:regular Rate:Normal  Non-obstructive CAD   Neuro/Psych  Neuromuscular disease    GI/Hepatic PUD, GERD  ,  Endo/Other  diabetes, Type 2  Renal/GU Renal InsufficiencyRenal disease     Musculoskeletal  (+) Arthritis ,   Abdominal (+) + obese,   Peds  Hematology   Anesthesia Other Findings   Reproductive/Obstetrics                             Anesthesia Physical Anesthesia Plan  ASA: III  Anesthesia Plan: MAC   Post-op Pain Management:    Induction: Intravenous  Airway Management Planned: Nasal Cannula  Additional Equipment:   Intra-op Plan:   Post-operative Plan:   Informed Consent: I have reviewed the patients History and Physical, chart, labs and discussed the procedure including the risks, benefits and alternatives for the proposed anesthesia with the patient or authorized representative who has indicated his/her understanding and acceptance.     Plan Discussed with:   Anesthesia Plan Comments:         Anesthesia Quick Evaluation

## 2016-04-11 NOTE — Op Note (Signed)
Patient brought to the operating room and prepped and draped in the usual manner.  Lid speculum inserted in right eye.  Stab incision made at the twelve o'clock position.  Provisc instilled in the anterior chamber.   A 2.4 mm. Stab incision was made temporally.  An anterior capsulotomy was done with a bent 25 gauge needle.  The nucleus was hydrodissected.  The Phaco tip was inserted in the anterior chamber and the nucleus was emulsified.  CDE was 4.74.  The cortical material was then removed with the I and A tip.  Posterior capsule was the polished.  The anterior chamber was deepened with Provisc.  A 17.5 Diopter Alcon [id number] IOL was then inserted in the capsular bag.  Provisc was then removed with the I and A tip.  The wound was then hydrated.  Patient sent to the Recovery Room in good condition with follow up in my office.  Preoperative Diagnosis:  Nuclear Cataract OD Postoperative Diagnosis:  Same Procedure name: Kelman Phacoemulsification OD with IOL

## 2016-04-11 NOTE — Discharge Instructions (Signed)
°  °          [facility] Instructions °[address]- [place] [address]-[place] °    ° °1. Avoid closing eyes tightly. One often closes the eye tightly when laughing, talking, sneezing, coughing or if they feel irritated. At these times, you should be careful not to close your eyes tightly. ° °2. Instill eye drops as instructed. To instill drops in your eye, open it, look up and have someone gently pull the lower lid down and instill a couple of drops inside the lower lid. ° °3. Do not touch upper lid. ° °4. Take Advil or Tylenol for pain. ° °5. You may use either eye for near work, such as reading or sewing and you may watch television. ° °6. You may have your hair done at the beauty parlor at any time. ° °7. Wear dark glasses with or without your own glasses if you are in bright light. ° °8. Call our office at [phone] or [phone] if you have sharp pain in your eye or unusual symptoms. ° °9.  FOLLOW UP WITH DR. SHAPIRO TODAY IN HIS [place] OFFICE AT 2:45pm. ° °  °I have received a copy of the above instructions and will follow them.  ° ° ° °IF YOU ARE IN IMMEDIATE DANGER CALL 911! ° °It is important for you to keep your follow-up appointment with your physician after discharge, OR, for you /your caregiver to make a follow-up appointment with your physician / medical provider after discharge. ° °Show these instructions to the next healthcare provider you see. °PATIENT INSTRUCTIONS °POST-ANESTHESIA ° °IMMEDIATELY FOLLOWING SURGERY:  Do not drive or operate machinery for the first twenty four hours after surgery.  Do not make any important decisions for twenty four hours after surgery or while taking narcotic pain medications or sedatives.  If you develop intractable nausea and vomiting or a severe headache please notify your doctor immediately. ° °FOLLOW-UP:  Please make an appointment with your surgeon as instructed. You do not need to follow up with anesthesia unless  specifically instructed to do so. ° °WOUND CARE INSTRUCTIONS (if applicable):  Keep a dry clean dressing on the anesthesia/puncture wound site if there is drainage.  Once the wound has quit draining you may leave it open to air.  Generally you should leave the bandage intact for twenty four hours unless there is drainage.  If the epidural site drains for more than 36-48 hours please call the anesthesia department. ° °QUESTIONS?:  Please feel free to call your physician or the hospital operator if you have any questions, and they will be happy to assist you.    ° ° ° °

## 2016-04-11 NOTE — Transfer of Care (Signed)
Immediate Anesthesia Transfer of Care Note  Patient: [name] [name]  Procedure(s) Performed: Procedure(s): CATARACT EXTRACTION PHACO AND INTRAOCULAR LENS PLACEMENT RIGHT EYE; CDE:  4.74 (Right)  Patient Location: Short Stay  Anesthesia Type:MAC  Level of Consciousness: awake and patient cooperative  Airway & Oxygen Therapy: Patient Spontanous Breathing and Patient connected to face mask oxygen  Post-op Assessment: Report given to RN, Post -op Vital signs reviewed and stable and Patient moving all extremities  Post vital signs: Reviewed and stable  Last Vitals:  Vitals:   04/11/16 0920 04/11/16 0925  BP: 137/83 130/81  Pulse:    Resp: (!) 21 18  Temp:      Last Pain:  Vitals:   04/11/16 0828  TempSrc: Oral  PainSc: 2          Complications: No apparent anesthesia complications

## 2016-04-13 ENCOUNTER — Other Ambulatory Visit: Payer: Self-pay | Admitting: Orthopedic Surgery

## 2016-04-13 DIAGNOSIS — M5412 Radiculopathy, cervical region: Secondary | ICD-10-CM

## 2016-04-13 DIAGNOSIS — M542 Cervicalgia: Secondary | ICD-10-CM

## 2016-04-14 ENCOUNTER — Encounter (HOSPITAL_COMMUNITY): Payer: Self-pay | Admitting: Ophthalmology

## 2016-04-17 ENCOUNTER — Telehealth: Payer: Self-pay | Admitting: Internal Medicine

## 2016-04-18 DIAGNOSIS — Z79899 Other long term (current) drug therapy: Secondary | ICD-10-CM | POA: Diagnosis not present

## 2016-04-18 DIAGNOSIS — I10 Essential (primary) hypertension: Secondary | ICD-10-CM | POA: Diagnosis not present

## 2016-04-18 DIAGNOSIS — E559 Vitamin D deficiency, unspecified: Secondary | ICD-10-CM | POA: Diagnosis not present

## 2016-04-18 DIAGNOSIS — N183 Chronic kidney disease, stage 3 (moderate): Secondary | ICD-10-CM | POA: Diagnosis not present

## 2016-04-18 DIAGNOSIS — D509 Iron deficiency anemia, unspecified: Secondary | ICD-10-CM | POA: Diagnosis not present

## 2016-04-18 DIAGNOSIS — R809 Proteinuria, unspecified: Secondary | ICD-10-CM | POA: Diagnosis not present

## 2016-04-25 ENCOUNTER — Other Ambulatory Visit: Payer: Self-pay | Admitting: Vascular Surgery

## 2016-04-25 DIAGNOSIS — N184 Chronic kidney disease, stage 4 (severe): Secondary | ICD-10-CM | POA: Diagnosis not present

## 2016-04-25 DIAGNOSIS — I10 Essential (primary) hypertension: Secondary | ICD-10-CM | POA: Diagnosis not present

## 2016-04-25 DIAGNOSIS — G4733 Obstructive sleep apnea (adult) (pediatric): Secondary | ICD-10-CM | POA: Diagnosis not present

## 2016-04-25 DIAGNOSIS — E669 Obesity, unspecified: Secondary | ICD-10-CM | POA: Diagnosis not present

## 2016-04-25 DIAGNOSIS — E1129 Type 2 diabetes mellitus with other diabetic kidney complication: Secondary | ICD-10-CM | POA: Diagnosis not present

## 2016-04-25 DIAGNOSIS — Z01810 Encounter for preprocedural cardiovascular examination: Secondary | ICD-10-CM

## 2016-04-25 DIAGNOSIS — D649 Anemia, unspecified: Secondary | ICD-10-CM | POA: Diagnosis not present

## 2016-04-27 ENCOUNTER — Other Ambulatory Visit: Payer: Self-pay | Admitting: "Endocrinology

## 2016-05-01 ENCOUNTER — Ambulatory Visit (INDEPENDENT_AMBULATORY_CARE_PROVIDER_SITE_OTHER): Payer: Medicare Other | Admitting: Internal Medicine

## 2016-05-01 ENCOUNTER — Encounter: Payer: Self-pay | Admitting: Internal Medicine

## 2016-05-01 VITALS — BP 145/89 | HR 79 | Ht 64.0 in | Wt 243.0 lb

## 2016-05-01 DIAGNOSIS — I10 Essential (primary) hypertension: Secondary | ICD-10-CM | POA: Diagnosis not present

## 2016-05-01 DIAGNOSIS — I2510 Atherosclerotic heart disease of native coronary artery without angina pectoris: Secondary | ICD-10-CM | POA: Diagnosis not present

## 2016-05-01 DIAGNOSIS — N184 Chronic kidney disease, stage 4 (severe): Secondary | ICD-10-CM

## 2016-05-01 DIAGNOSIS — E1122 Type 2 diabetes mellitus with diabetic chronic kidney disease: Secondary | ICD-10-CM | POA: Diagnosis not present

## 2016-05-01 DIAGNOSIS — G4733 Obstructive sleep apnea (adult) (pediatric): Secondary | ICD-10-CM

## 2016-05-01 DIAGNOSIS — Z9989 Dependence on other enabling machines and devices: Secondary | ICD-10-CM

## 2016-05-01 DIAGNOSIS — Z794 Long term (current) use of insulin: Secondary | ICD-10-CM

## 2016-05-01 MED ORDER — ASPIRIN EC 81 MG PO TBEC: 81.0000 mg | DELAYED_RELEASE_TABLET | Freq: Every day | ORAL | 3 refills | Status: DC

## 2016-05-01 NOTE — Patient Instructions (Signed)
Medication Instructions:  STOP Plavix START Aspirin 81mg  Take 1 tab once a day  Labwork: None   Testing/Procedures: None   Follow-Up: Your physician wants you to follow-up in: 6 months with Dr Debara Pickett. You will receive a reminder letter in the mail two months in advance. If you don't receive a letter, please call our office to schedule the follow-up appointment.   Any Other Special Instructions Will Be Listed Below (If Applicable).     If you need a refill on your cardiac medications before your next appointment, please call your pharmacy.

## 2016-05-01 NOTE — Progress Notes (Signed)
OFFICE NOTE  Chief Complaint:  Routine follow-up  Primary Care Physician: [name], MD  HPI:  [name] [name] Is a pleasant 65 year old male with a history of insulin-dependent diabetes, dyslipidemia, and hypertension. He's been a diabetic since 1999. He's been on insulin for only 3-4 years. He was a former patient of Dr. Rollene Fare who ordered a stress test in 2014. This demonstrated reversible inferoapical ischemia and was read as moderate risk. He ultimately underwent heart catheterization by Dr. Ellyn Hack on 03/28/2013 which demonstrated a small, possibly minimal aneurysmal bleb in the LAD but no significant disease. There is a dominant RCA with no disease. Essentially there was no significant obstructive coronary disease. At the time his EKG did show inferior T-wave abnormalities and very small R waves but no evidence for Q waves. Today he is referred back for abnormalities EKG which directly compared to his old EKG shows no significant change. There is an IVCD and very small R waves inferiorly, which the computer may have this read as inferior infarct. I do not see any significant change compared to his previous studies. He denies any new or worsening chest pain or shortness of breath.  Mr. [name] [name] returns today for follow-up. Overall he is feeling fairly well. He denies any chest pain or shortness of breath. Blood pressure is slightly elevated to 162/94, however recheck was 140/84. Please not been able to lose much weight. He is on chronic Plavix therapy due to what was thought to be plaque rupture but did not require stent by catheterization in 2014. This was a preferred agent over aspirin and he's remained on that, but certainly could come off of that if he were to undergo any procedures he is contemplating a penile implant for erectile dysfunction.  05/01/2016  Mr. [name] [name] was seen today in follow-up. He recently saw [name], NP, in April 2016 for follow-up. Blood pressure was  somewhat elevated at the time. It was not is ambulatory due to problems with his knee. There was a discussion about stopping Plavix and keeping him on aspirin however he remained on Plavix. Unfortunately he has stage IV chronic kidney disease and there is discussion about placement of a fistula. He scheduled for upper extremity Dopplers at the end of this month and follow-up with Dr. Oneida Alar. He had a number of questions today regarding dialysis and his fistula, but I reassured him based on his creatinine that he should consider this even if his numbers seem to have stabilized. He denies any chest pain or worsening shortness of breath. He did get new CPAP equipment but reports intermittent compliance with it. He still has some daytime fatigue.  PMHx:  Past Medical History:  Diagnosis Date  . Arthritis   . Bell palsy   . CAD (coronary artery disease)    a. 2014 MV: abnl w/ infap ischemia; b. 03/2013 Cath: aneurysmal bleb in the LAD w/ otw nonobs dzs-->Med Rx.  . Chronic back pain   . Chronic knee pain    a. 09/2015 s/p R TKA.  Marland Kitchen Chronic pain   . Chronic shoulder pain   . Chronic sinusitis   . CKD (chronic kidney disease), stage IV ([place])    stage 4 per office visit note of Dr Lowanda Foster on 05/2015   . COPD (chronic obstructive pulmonary disease) ([place])   . Diabetes mellitus without complication ([place])   . Essential hypertension   . GERD (gastroesophageal reflux disease)   . Hepatomegaly    noted on noncontrast CT 2015  .  Hyperlipidemia   . Lateral meniscus tear   . Obesity    [place]  . Obstructive sleep apnea    cpap - settings at 4   . On home oxygen therapy    uses 2l when is going somewhere per patient   . Post traumatic stress disorder   . PUD (peptic ulcer disease)    remote, reports f/u EGD about 8 years ago unremarkable   . Reactive airway disease    related to exposure to chemical during 9/11  . Sinusitis   . Vitamin D deficiency     Past Surgical History:  Procedure  Laterality Date  . ASAD LT SHOULDER  12/2008   left shoulder  . CATARACT EXTRACTION W/PHACO Left 03/28/2016   Procedure: CATARACT EXTRACTION PHACO AND INTRAOCULAR LENS PLACEMENT LEFT EYE;  Surgeon: [name], MD;  Location: [facility];  Service: Ophthalmology;  Laterality: Left;  CDE: 4.77  . CATARACT EXTRACTION W/PHACO Right 04/11/2016   Procedure: CATARACT EXTRACTION PHACO AND INTRAOCULAR LENS PLACEMENT RIGHT EYE; CDE:  4.74;  Surgeon: [name], MD;  Location: [facility];  Service: Ophthalmology;  Laterality: Right;  . COLONOSCOPY  10/2008   Fields: Rectal polyp obliterated, not retrieved, hemorrhoids, single ascending colon diverticulum near the CV. Next colonoscopy April 2020  . COLONOSCOPY N/A 12/25/2014   SLF: 1. Colorectal polyps (2) removed 2. Small internal hemorrhoids 3. the left colon is severely redundant  . DOPPLER ECHOCARDIOGRAPHY    . ESOPHAGOGASTRODUODENOSCOPY N/A 12/25/2014   SLF: 1. Anemia most likely due to CRI, gastritis, gastric polyps 2. Moderate non-erosive gastriits and mild duodenitis.  3.TWo large gstric polyps removed.   Marland Kitchen EYE SURGERY  12/22/2010   tear duct probing-[place]  . FOREIGN BODY REMOVAL  03/29/2011   Procedure: REMOVAL FOREIGN BODY EXTREMITY;  Surgeon: Arther Abbott, MD;  Location: [facility];  Service: Orthopedics;  Laterality: Right;  Removal Foreign Body Right Thumb  . KNEE ARTHROSCOPY  10/2007   left  . KNEE ARTHROSCOPY WITH LATERAL MENISECTOMY Right 10/14/2015   Procedure: LEFT KNEE ARTHROSCOPY WITH PARTIAL LATERAL MENISECTOMY;  Surgeon: Carole Civil, MD;  Location: [facility];  Service: Orthopedics;  Laterality: Right;  . LEFT HEART CATHETERIZATION WITH CORONARY ANGIOGRAM N/A 03/28/2013   Procedure: LEFT HEART CATHETERIZATION WITH CORONARY ANGIOGRAM;  Surgeon: Leonie Man, MD;  Location: [facility] CATH LAB;  Service: Cardiovascular;  Laterality: N/A;  . NM MYOVIEW LTD    . PENILE PROSTHESIS IMPLANT N/A 08/16/2015   Procedure: PENILE PROTHESIS INFLATABLE, three  piece, Excisional biopsy of Penile ulcer, Penile molding;  Surgeon: Carolan Clines, MD;  Location: [facility];  Service: Urology;  Laterality: N/A;  . QUADRICEPS TENDON REPAIR  07/21/2011   Procedure: REPAIR QUADRICEP TENDON;  Surgeon: Arther Abbott, MD;  Location: [facility];  Service: Orthopedics;  Laterality: Right;  . TOENAIL EXCISION     removed [id number]  . UMBILICAL HERNIA REPAIR  2007   [place]    FAMHx:  Family History  Problem Relation Age of Onset  . Hypertension Mother     MI  . Cancer Mother     breast   . Diabetes Mother   . Diabetes Father   . Hypertension Father   . Hypertension Sister   . Diabetes Sister   . Arthritis    . Asthma    . Lung disease    . Anesthesia problems Neg Hx   . Hypotension Neg Hx   . Malignant hyperthermia Neg Hx   . Pseudochol deficiency Neg Hx   .  Colon cancer Neg Hx     SOCHx:   reports that he quit smoking about 6 years ago. His smoking use included Cigarettes. He has a 25.00 pack-year smoking history. He has never used smokeless tobacco. He reports that he drinks alcohol. He reports that he uses drugs, including Cocaine and Marijuana.  ALLERGIES:  Allergies  Allergen Reactions  . Opana [Oxymorphone Hcl] Itching  . Tramadol Itching    ROS: A comprehensive review of systems was negative.  HOME MEDS: Current Outpatient Prescriptions  Medication Sig Dispense Refill  . albuterol (PROVENTIL HFA) 108 (90 BASE) MCG/ACT inhaler Inhale 2 puffs into the lungs every 4 (four) hours as needed for wheezing.     Marland Kitchen amLODipine (NORVASC) 10 MG tablet Take 10 mg by mouth daily.     . budesonide-formoterol (SYMBICORT) 160-4.5 MCG/ACT inhaler Inhale 2 puffs into the lungs daily as needed (Shortness of Breath).     . cetirizine (ZYRTEC) 10 MG tablet Take 10 mg by mouth daily as needed for allergies.     . Cholecalciferol (VITAMIN D3) 5000 units CAPS Take 1 capsule (5,000 Units total) by mouth daily. (Patient taking differently: Take 2,000 Units  by mouth daily. ) 90 capsule 0  . clopidogrel (PLAVIX) 75 MG tablet Take 75 mg by mouth daily.    . hydrALAZINE (APRESOLINE) 10 MG tablet Take 1 tablet (10 mg total) by mouth 3 (three) times daily. Keep appointment. (Patient taking differently: Take 25 mg by mouth 3 (three) times daily. Keep appointment.) 270 tablet 0  . Insulin Glargine (LANTUS SOLOSTAR) 100 UNIT/ML Solostar Pen Inject 40 Units into the skin daily at 10 pm. 15 pen 0  . iron polysaccharides (NIFEREX) 150 MG capsule Take 150 mg by mouth daily.    . mometasone (NASONEX) 50 MCG/ACT nasal spray Place 2 sprays into the nose daily as needed (allergies).     . pantoprazole (PROTONIX) 40 MG tablet 1 PO 30 MINS PRIOR TO MEALS BID 180 tablet 3  . simvastatin (ZOCOR) 20 MG tablet Take 20 mg by mouth every morning.     . TRADJENTA 5 MG TABS tablet TAKE 1 TABLET EVERY DAY 90 tablet 0   No current facility-administered medications for this visit.     LABS/IMAGING: No results found for this or any previous visit (from the past 48 hour(s)). No results found.  VITALS: BP (!) 145/89   Pulse 79   Ht 5\' 4"  (1.626 m)   Wt 243 lb (110.2 kg)   BMI 41.71 kg/m   EXAM: General appearance: alert and no distress Neck: no carotid bruit and no JVD Lungs: clear to auscultation bilaterally Heart: regular rate and rhythm, S1, S2 normal, no murmur, click, rub or gallop Abdomen: soft, non-tender; bowel sounds normal; no masses,  no organomegaly and morbidly obse Extremities: extremities normal, atraumatic, no cyanosis or edema Pulses: 2+ and symmetric Skin: Skin color, texture, turgor normal. No rashes or lesions Neurologic: Grossly normal Psyvh: Normal  EKG: Deferred  ASSESSMENT: 1. Mild coronary artery disease by cath in September 2014, question plaque rupture-on Plavix (no stent) 2. Insulin-dependent diabetes 3. Dyslipidemia-controlled 4. Hypertension-at goal 5. Morbid obesity 6. Erectile dysfunction - s/p implant 7. OSA on  CPAP  PLAN: 1.   Mr. Tootle is looking at possible fistula placement for chronic kidney disease later this month. We'll need to continue to work on blood pressure and diabetes. He reported his recent A1c was 6.7. Weight unfortunately has not changed. He is status post penile implant for  a total dysfunction which she says is working. He is variably compliant with CPAP and I've encouraged him to use that more frequently as well as increase walking and activity. At this point I don't see ongoing indication for Plavix therapy. He can switch to aspirin 81 mg daily  Follow-up 6 months.  Pixie Casino, MD, [facility] Attending Cardiologist [facility] Makennah Omura 05/01/2016, 9:13 AM

## 2016-05-02 ENCOUNTER — Emergency Department (HOSPITAL_COMMUNITY)
Admission: EM | Admit: 2016-05-02 | Discharge: 2016-05-02 | Disposition: A | Discharge status: Home / Self Care | Payer: Medicare Other | Attending: Emergency Medicine | Admitting: Emergency Medicine

## 2016-05-02 ENCOUNTER — Telehealth: Payer: Self-pay

## 2016-05-02 ENCOUNTER — Encounter (HOSPITAL_COMMUNITY): Payer: Self-pay | Admitting: *Deleted

## 2016-05-02 DIAGNOSIS — Z794 Long term (current) use of insulin: Secondary | ICD-10-CM | POA: Diagnosis not present

## 2016-05-02 DIAGNOSIS — Y999 Unspecified external cause status: Secondary | ICD-10-CM | POA: Diagnosis not present

## 2016-05-02 DIAGNOSIS — Y929 Unspecified place or not applicable: Secondary | ICD-10-CM | POA: Diagnosis not present

## 2016-05-02 DIAGNOSIS — I129 Hypertensive chronic kidney disease with stage 1 through stage 4 chronic kidney disease, or unspecified chronic kidney disease: Secondary | ICD-10-CM | POA: Insufficient documentation

## 2016-05-02 DIAGNOSIS — Z7982 Long term (current) use of aspirin: Secondary | ICD-10-CM | POA: Insufficient documentation

## 2016-05-02 DIAGNOSIS — N184 Chronic kidney disease, stage 4 (severe): Secondary | ICD-10-CM | POA: Insufficient documentation

## 2016-05-02 DIAGNOSIS — I2510 Atherosclerotic heart disease of native coronary artery without angina pectoris: Secondary | ICD-10-CM | POA: Insufficient documentation

## 2016-05-02 DIAGNOSIS — J449 Chronic obstructive pulmonary disease, unspecified: Secondary | ICD-10-CM | POA: Insufficient documentation

## 2016-05-02 DIAGNOSIS — E1122 Type 2 diabetes mellitus with diabetic chronic kidney disease: Secondary | ICD-10-CM | POA: Diagnosis not present

## 2016-05-02 DIAGNOSIS — X58XXXA Exposure to other specified factors, initial encounter: Secondary | ICD-10-CM | POA: Diagnosis not present

## 2016-05-02 DIAGNOSIS — Z79899 Other long term (current) drug therapy: Secondary | ICD-10-CM | POA: Insufficient documentation

## 2016-05-02 DIAGNOSIS — Y939 Activity, unspecified: Secondary | ICD-10-CM | POA: Insufficient documentation

## 2016-05-02 DIAGNOSIS — Z87891 Personal history of nicotine dependence: Secondary | ICD-10-CM | POA: Insufficient documentation

## 2016-05-02 DIAGNOSIS — T162XXA Foreign body in left ear, initial encounter: Secondary | ICD-10-CM

## 2016-05-02 NOTE — ED Provider Notes (Signed)
[facility] DEPT Provider Note   CSN: [id number] Arrival date & time: 05/02/16  0140     History   Chief Complaint Chief Complaint  Patient presents with  . Foreign Body in [place] is a 65 y.o. male.  The history is provided by the patient.  Foreign Body in Ear  This is a new problem. The problem occurs constantly. The problem has not changed since onset.Nothing aggravates the symptoms. Nothing relieves the symptoms.  Patient presents with foreign body in left ear He reports several hrs ago the end of Q-tip came off in his ear He has attempted to remove it but no success No other acute complaints  Past Medical History:  Diagnosis Date  . Arthritis   . Bell palsy   . CAD (coronary artery disease)    a. 2014 MV: abnl w/ infap ischemia; b. 03/2013 Cath: aneurysmal bleb in the LAD w/ otw nonobs dzs-->Med Rx.  . Chronic back pain   . Chronic knee pain    a. 09/2015 s/p R TKA.  Marland Kitchen Chronic pain   . Chronic shoulder pain   . Chronic sinusitis   . CKD (chronic kidney disease), stage IV ([place])    stage 4 per office visit note of Dr Lowanda Foster on 05/2015   . COPD (chronic obstructive pulmonary disease) ([place])   . Diabetes mellitus without complication ([place])   . Essential hypertension   . GERD (gastroesophageal reflux disease)   . Hepatomegaly    noted on noncontrast CT 2015  . Hyperlipidemia   . Lateral meniscus tear   . Obesity    Truncal  . Obstructive sleep apnea    cpap - settings at 4   . On home oxygen therapy    uses 2l when is going somewhere per patient   . Post traumatic stress disorder   . PUD (peptic ulcer disease)    remote, reports f/u EGD about 8 years ago unremarkable   . Reactive airway disease    related to exposure to chemical during 9/11  . Sinusitis   . Vitamin D deficiency     Patient Active Problem List   Diagnosis Date Noted  . Morbid obesity due to excess calories ([place]) 03/22/2016  . CAD (coronary artery disease)   .  Erectile dysfunction 08/16/2015  . Hemorrhoids 07/05/2015  . Vitamin D deficiency 06/04/2015  . OSA on CPAP 05/14/2015  . Anemia in chronic kidney disease 03/24/2015  . Fatty liver 12/01/2014  . Back pain 06/05/2013  . OA (osteoarthritis) of knee 03/15/2011  . CTS (carpal tunnel syndrome) 03/15/2011  . Type 2 diabetes mellitus with stage 4 chronic kidney disease, with long-term current use of insulin ([place]) 09/29/2008  . Hyperlipidemia 09/29/2008  . Essential hypertension 09/29/2008  . GERD 09/29/2008    Past Surgical History:  Procedure Laterality Date  . ASAD LT SHOULDER  12/2008   left shoulder  . CATARACT EXTRACTION W/PHACO Left 03/28/2016   Procedure: CATARACT EXTRACTION PHACO AND INTRAOCULAR LENS PLACEMENT LEFT EYE;  Surgeon: [name], MD;  Location: [facility];  Service: Ophthalmology;  Laterality: Left;  CDE: 4.77  . CATARACT EXTRACTION W/PHACO Right 04/11/2016   Procedure: CATARACT EXTRACTION PHACO AND INTRAOCULAR LENS PLACEMENT RIGHT EYE; CDE:  4.74;  Surgeon: [name], MD;  Location: [facility];  Service: Ophthalmology;  Laterality: Right;  . COLONOSCOPY  10/2008   Fields: Rectal polyp obliterated, not retrieved, hemorrhoids, single ascending colon diverticulum near the CV. Next colonoscopy April  2020  . COLONOSCOPY N/A 12/25/2014   SLF: 1. Colorectal polyps (2) removed 2. Small internal hemorrhoids 3. the left colon is severely redundant  . DOPPLER ECHOCARDIOGRAPHY    . ESOPHAGOGASTRODUODENOSCOPY N/A 12/25/2014   SLF: 1. Anemia most likely due to CRI, gastritis, gastric polyps 2. Moderate non-erosive gastriits and mild duodenitis.  3.TWo large gstric polyps removed.   Marland Kitchen EYE SURGERY  12/22/2010   tear duct probing-[place]  . FOREIGN BODY REMOVAL  03/29/2011   Procedure: REMOVAL FOREIGN BODY EXTREMITY;  Surgeon: Arther Abbott, MD;  Location: [facility];  Service: Orthopedics;  Laterality: Right;  Removal Foreign Body Right Thumb  . KNEE ARTHROSCOPY  10/2007   left  . KNEE  ARTHROSCOPY WITH LATERAL MENISECTOMY Right 10/14/2015   Procedure: LEFT KNEE ARTHROSCOPY WITH PARTIAL LATERAL MENISECTOMY;  Surgeon: Carole Civil, MD;  Location: [facility];  Service: Orthopedics;  Laterality: Right;  . LEFT HEART CATHETERIZATION WITH CORONARY ANGIOGRAM N/A 03/28/2013   Procedure: LEFT HEART CATHETERIZATION WITH CORONARY ANGIOGRAM;  Surgeon: Leonie Man, MD;  Location[facility] LAB;  Service: Cardiovascular;  Laterality: N/A;  . NM MYOVIEW LTD    . PENILE PROSTHESIS IMPLANT N/A 08/16/2015   Procedure: PENILE PROTHESIS INFLATABLE, three piece, Excisional biopsy of Penile ulcer, Penile molding;  Surgeon: Carolan Clines, MD;  Location: [facility];  Service: Urology;  Laterality: N/A;  . QUADRICEPS TENDON REPAIR  07/21/2011   Procedure: REPAIR QUADRICEP TENDON;  Surgeon: Arther Abbott, MD;  Location: [facility];  Service: Orthopedics;  Laterality: Right;  . TOENAIL EXCISION     removed [id number]  . UMBILICAL HERNIA REPAIR  2007   [place]       Home Medications    Prior to Admission medications   Medication Sig Start Date End Date Taking? Authorizing Provider  albuterol (PROVENTIL HFA) 108 (90 BASE) MCG/ACT inhaler Inhale 2 puffs into the lungs every 4 (four) hours as needed for wheezing.     Historical Provider, MD  amLODipine (NORVASC) 10 MG tablet Take 10 mg by mouth daily.     Historical Provider, MD  aspirin EC 81 MG tablet Take 1 tablet (81 mg total) by mouth daily. 05/01/16   Pixie Casino, MD  budesonide-formoterol [facility]) 160-4.5 MCG/ACT inhaler Inhale 2 puffs into the lungs daily as needed (Shortness of Breath).     Historical Provider, MD  cetirizine (ZYRTEC) 10 MG tablet Take 10 mg by mouth daily as needed for allergies.     Historical Provider, MD  Cholecalciferol (VITAMIN D3) 5000 units CAPS Take 1 capsule (5,000 Units total) by mouth daily. Patient taking differently: Take 2,000 Units by mouth daily.  03/22/16   Cassandria Anger, MD  hydrALAZINE  (APRESOLINE) 10 MG tablet Take 1 tablet (10 mg total) by mouth 3 (three) times daily. Keep appointment. Patient taking differently: Take 25 mg by mouth 3 (three) times daily. Keep appointment. 02/17/16   Pixie Casino, MD  Insulin Glargine (LANTUS SOLOSTAR) 100 UNIT/ML Solostar Pen Inject 40 Units into the skin daily at 10 pm. 03/22/16   Cassandria Anger, MD  iron polysaccharides (NIFEREX) 150 MG capsule Take 150 mg by mouth daily.    Historical Provider, MD  mometasone (NASONEX) 50 MCG/ACT nasal spray Place 2 sprays into the nose daily as needed (allergies).     Historical Provider, MD  pantoprazole (PROTONIX) 40 MG tablet 1 PO 30 MINS PRIOR TO MEALS BID 12/30/15   Danie Binder, MD  simvastatin (ZOCOR) 20 MG tablet Take 20  mg by mouth every morning.     Historical Provider, MD  TRADJENTA 5 MG TABS tablet TAKE 1 TABLET EVERY DAY 04/27/16   Cassandria Anger, MD    Family History Family History  Problem Relation Age of Onset  . Hypertension Mother     MI  . Cancer Mother     breast   . Diabetes Mother   . Diabetes Father   . Hypertension Father   . Hypertension Sister   . Diabetes Sister   . Arthritis    . Asthma    . Lung disease    . Anesthesia problems Neg Hx   . Hypotension Neg Hx   . Malignant hyperthermia Neg Hx   . Pseudochol deficiency Neg Hx   . Colon cancer Neg Hx     Social History Social History  Substance Use Topics  . Smoking status: Former Smoker    Packs/day: 1.00    Years: 25.00    Types: Cigarettes    Quit date: 03/27/2010  . Smokeless tobacco: Never Used     Comment: Quit x 7 years  . Alcohol use 0.0 oz/week     Comment: occasionally     Allergies   Opana [oxymorphone hcl] and Tramadol   Review of Systems Review of Systems  Constitutional: Negative for fever.  HENT: Negative for ear discharge.      Physical Exam Updated Vital Signs BP 156/94 (BP Location: Left Arm)   Pulse 75   Temp 98.4 F (36.9 C) (Oral)   Resp 16   Ht 5\' 4"   (1.626 m)   Wt 110.2 kg   SpO2 95%   BMI 41.71 kg/m   Physical Exam CONSTITUTIONAL: Well developed/well nourished HEAD: Normocephalic/atraumatic ENMT: Mucous membranes moist, end of q-tip noted in left ear.  Left TM intact.  No drainage/erythema/bleeding noted NECK: supple no meningeal signs CV: S1/S2 noted LUNGS: Lungs are clear to auscultation bilaterally ABDOMEN: soft NEURO: Pt is awake/alert/appropriate, moves all extremitiesx4.  SKIN: warm, color normal PSYCH: no abnormalities of mood noted, alert and oriented to situation   ED Treatments / Results  Labs (all labs ordered are listed, but only abnormal results are displayed) Labs Reviewed - No data to display  EKG  EKG Interpretation None       Radiology No results found.  Procedures .Foreign Body Removal Date/Time: 05/02/2016 2:30 AM Performed by: Ripley Fraise Authorized by: Ripley Fraise  Consent: Verbal consent obtained. Patient identity confirmed: verbally with patient 1 objects recovered. Objects recovered: end of qtip Post-procedure assessment: foreign body removed Patient tolerance: Patient tolerated the procedure well with no immediate complications Comments: Alligator forcepts utilized to remove end of qtip without difficulty Post procedure, left TM intact Pt tolerated well No bleeding noted   (including critical care time)  Medications Ordered in ED Medications - No data to display   Initial Impression / Assessment and Plan / ED Course  I have reviewed the triage vital signs and the nursing notes.    Clinical Course      Final Clinical Impressions(s) / ED Diagnoses   Final diagnoses:  Foreign body of left ear, initial encounter    New Prescriptions Discharge Medication List as of 05/02/2016  2:31 AM       Ripley Fraise, MD 05/02/16 [phone]

## 2016-05-02 NOTE — ED Triage Notes (Signed)
Pt states the end of qtip cam off in left ear;  White object noted to be in ear

## 2016-05-02 NOTE — Telephone Encounter (Signed)
Patient was seen in office yesterday for routine visit. Was also given medical clearance for Cervical myelogram. Medical clearance faxed back to [facility].

## 2016-05-02 NOTE — ED Notes (Signed)
Pt alert & oriented x4, stable gait. Patient  given discharge instructions, paperwork & prescription(s). Patient verbalized understanding. Pt left department w/ no further questions. 

## 2016-05-09 ENCOUNTER — Encounter: Payer: Self-pay | Admitting: Vascular Surgery

## 2016-05-11 ENCOUNTER — Ambulatory Visit (INDEPENDENT_AMBULATORY_CARE_PROVIDER_SITE_OTHER)
Admission: RE | Admit: 2016-05-11 | Discharge: 2016-05-11 | Disposition: A | Discharge status: Home / Self Care | Payer: Medicare Other | Source: Ambulatory Visit | Attending: Vascular Surgery | Admitting: Vascular Surgery

## 2016-05-11 ENCOUNTER — Ambulatory Visit (HOSPITAL_COMMUNITY)
Admission: RE | Admit: 2016-05-11 | Discharge: 2016-05-11 | Disposition: A | Discharge status: Home / Self Care | Payer: Medicare Other | Source: Ambulatory Visit | Attending: Vascular Surgery | Admitting: Vascular Surgery

## 2016-05-11 ENCOUNTER — Ambulatory Visit (INDEPENDENT_AMBULATORY_CARE_PROVIDER_SITE_OTHER): Payer: Medicare Other | Admitting: Vascular Surgery

## 2016-05-11 ENCOUNTER — Encounter: Payer: Self-pay | Admitting: Vascular Surgery

## 2016-05-11 VITALS — BP 141/89 | HR 74 | Temp 97.7°F | Resp 20 | Ht 64.0 in | Wt 243.2 lb

## 2016-05-11 DIAGNOSIS — Z01810 Encounter for preprocedural cardiovascular examination: Secondary | ICD-10-CM | POA: Diagnosis not present

## 2016-05-11 DIAGNOSIS — N184 Chronic kidney disease, stage 4 (severe): Secondary | ICD-10-CM | POA: Insufficient documentation

## 2016-05-11 DIAGNOSIS — I2510 Atherosclerotic heart disease of native coronary artery without angina pectoris: Secondary | ICD-10-CM

## 2016-05-11 NOTE — Progress Notes (Signed)
Referring Physician: Dr Hinda Lenis  Patient name: [name] [name] MRN: [id number] DOB: 22-Aug-1950 Sex: male  REASON FOR CONSULT: hemodialysis access  HPI: [name] [name] is a 65 y.o. male, referred for placement of a permanent hemodialysis access. The patient's recent serum creatinine was 3.7. He is CK D4. He is right handed. He denies any history of pacemaker AICD. He has no prior access procedures. Not currently on hemodialysis. Other medical problems include coronary artery disease, chronic back pain, COPD, hypertension, upper lipidemia, sleep apnea all of which have been stable.  Past Medical History:  Diagnosis Date  . Arthritis   . Bell palsy   . CAD (coronary artery disease)    a. 2014 MV: abnl w/ infap ischemia; b. 03/2013 Cath: aneurysmal bleb in the LAD w/ otw nonobs dzs-->Med Rx.  . Chronic back pain   . Chronic knee pain    a. 09/2015 s/p R TKA.  Marland Kitchen Chronic pain   . Chronic shoulder pain   . Chronic sinusitis   . CKD (chronic kidney disease), stage IV ([place])    stage 4 per office visit note of Dr Lowanda Foster on 05/2015   . COPD (chronic obstructive pulmonary disease) ([place])   . Diabetes mellitus without complication ([place])   . Essential hypertension   . GERD (gastroesophageal reflux disease)   . Hepatomegaly    noted on noncontrast CT 2015  . Hyperlipidemia   . Lateral meniscus tear   . Obesity    Truncal  . Obstructive sleep apnea    cpap - settings at 4   . On home oxygen therapy    uses 2l when is going somewhere per patient   . Post traumatic stress disorder   . PUD (peptic ulcer disease)    remote, reports f/u EGD about 8 years ago unremarkable   . Reactive airway disease    related to exposure to chemical during 9/11  . Sinusitis   . Vitamin D deficiency    Past Surgical History:  Procedure Laterality Date  . ASAD LT SHOULDER  12/2008   left shoulder  . CATARACT EXTRACTION W/PHACO Left 03/28/2016   Procedure: CATARACT EXTRACTION PHACO AND INTRAOCULAR LENS  PLACEMENT LEFT EYE;  Surgeon: [name], MD;  Location: [facility];  Service: Ophthalmology;  Laterality: Left;  CDE: 4.77  . CATARACT EXTRACTION W/PHACO Right 04/11/2016   Procedure: CATARACT EXTRACTION PHACO AND INTRAOCULAR LENS PLACEMENT RIGHT EYE; CDE:  4.74;  Surgeon: [name], MD;  Location: [facility];  Service: Ophthalmology;  Laterality: Right;  . COLONOSCOPY  10/2008   [place] Buer: Rectal polyp obliterated, not retrieved, hemorrhoids, single ascending colon diverticulum near the CV. Next colonoscopy April 2020  . COLONOSCOPY N/A 12/25/2014   SLF: 1. Colorectal polyps (2) removed 2. Small internal hemorrhoids 3. the left colon is severely redundant  . DOPPLER ECHOCARDIOGRAPHY    . ESOPHAGOGASTRODUODENOSCOPY N/A 12/25/2014   SLF: 1. Anemia most likely due to CRI, gastritis, gastric polyps 2. Moderate non-erosive gastriits and mild duodenitis.  3.TWo large gstric polyps removed.   Marland Kitchen EYE SURGERY  12/22/2010   tear duct probing-[place]  . FOREIGN BODY REMOVAL  03/29/2011   Procedure: REMOVAL FOREIGN BODY EXTREMITY;  Surgeon: Arther Abbott, MD;  Location: [facility];  Service: Orthopedics;  Laterality: Right;  Removal Foreign Body Right Thumb  . KNEE ARTHROSCOPY  10/2007   left  . KNEE ARTHROSCOPY WITH LATERAL MENISECTOMY Right 10/14/2015   Procedure: LEFT KNEE ARTHROSCOPY WITH PARTIAL LATERAL MENISECTOMY;  Surgeon: Tim Lair  Aline Brochure, MD;  Location: [facility];  Service: Orthopedics;  Laterality: Right;  . LEFT HEART CATHETERIZATION WITH CORONARY ANGIOGRAM N/A 03/28/2013   Procedure: LEFT HEART CATHETERIZATION WITH CORONARY ANGIOGRAM;  Surgeon: Leonie Man, MD;  Location: [facility] CATH LAB;  Service: Cardiovascular;  Laterality: N/A;  . NM MYOVIEW LTD    . PENILE PROSTHESIS IMPLANT N/A 08/16/2015   Procedure: PENILE PROTHESIS INFLATABLE, three piece, Excisional biopsy of Penile ulcer, Penile molding;  Surgeon: Carolan Clines, MD;  Location: [facility];  Service: Urology;  Laterality: N/A;  . QUADRICEPS TENDON  REPAIR  07/21/2011   Procedure: REPAIR QUADRICEP TENDON;  Surgeon: Arther Abbott, MD;  Location: [facility];  Service: Orthopedics;  Laterality: Right;  . TOENAIL EXCISION     removed [id number]  . UMBILICAL HERNIA REPAIR  2007   [place]    Family History  Problem Relation Age of Onset  . Hypertension Mother     MI  . Cancer Mother     breast   . Diabetes Mother   . Diabetes Father   . Hypertension Father   . Hypertension Sister   . Diabetes Sister   . Arthritis    . Asthma    . Lung disease    . Anesthesia problems Neg Hx   . Hypotension Neg Hx   . Malignant hyperthermia Neg Hx   . Pseudochol deficiency Neg Hx   . Colon cancer Neg Hx     SOCIAL HISTORY: Social History   Social History  . Marital status: Married    Spouse name: N/A  . Number of children: 2  . Years of education: 12th grade   Occupational History  . disabled   .     Marland Kitchen  Unemployed   Social History Main Topics  . Smoking status: Former Smoker    Packs/day: 1.00    Years: 25.00    Types: Cigarettes    Quit date: 03/27/2010  . Smokeless tobacco: Never Used     Comment: Quit x 7 years  . Alcohol use 0.0 oz/week     Comment: occasionally  . Drug use:     Types: Cocaine, Marijuana     Comment: stopped feb2011  . Sexual activity: Not on file   Other Topics Concern  . Not on file   Social History Narrative   He quit smoking in 2010. 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 He developed pulmonary problems, became disabled because of lower airway disease in 2009.     Allergies  Allergen Reactions  . Opana [Oxymorphone Hcl] Itching  . Tramadol Itching    Current Outpatient Prescriptions  Medication Sig Dispense Refill  . albuterol (PROVENTIL HFA) 108 (90 BASE) MCG/ACT inhaler Inhale 2 puffs into the lungs every 4 (four) hours as needed for wheezing.     Marland Kitchen amLODipine (NORVASC) 10 MG tablet Take 10 mg by mouth daily.     Marland Kitchen aspirin EC 81 MG tablet Take 1  tablet (81 mg total) by mouth daily. 90 tablet 3  . budesonide-formoterol (SYMBICORT) 160-4.5 MCG/ACT inhaler Inhale 2 puffs into the lungs daily as needed (Shortness of Breath).     . cetirizine (ZYRTEC) 10 MG tablet Take 10 mg by mouth daily as needed for allergies.     . Cholecalciferol (VITAMIN D3) 5000 units CAPS Take 1 capsule (5,000 Units total) by mouth daily. (Patient taking differently: Take 2,000 Units by mouth daily. ) 90 capsule 0  . hydrALAZINE (APRESOLINE)  10 MG tablet Take 1 tablet (10 mg total) by mouth 3 (three) times daily. Keep appointment. (Patient taking differently: Take 25 mg by mouth 3 (three) times daily. Keep appointment.) 270 tablet 0  . Insulin Glargine (LANTUS SOLOSTAR) 100 UNIT/ML Solostar Pen Inject 40 Units into the skin daily at 10 pm. 15 pen 0  . iron polysaccharides (NIFEREX) 150 MG capsule Take 150 mg by mouth daily.    . mometasone (NASONEX) 50 MCG/ACT nasal spray Place 2 sprays into the nose daily as needed (allergies).     . pantoprazole (PROTONIX) 40 MG tablet 1 PO 30 MINS PRIOR TO MEALS BID 180 tablet 3  . simvastatin (ZOCOR) 20 MG tablet Take 20 mg by mouth every morning.     . TRADJENTA 5 MG TABS tablet TAKE 1 TABLET EVERY DAY 90 tablet 0   No current facility-administered medications for this visit.     ROS:   General:  No weight loss, Fever, chills  HEENT: No recent headaches, no nasal bleeding, no visual changes, no sore throat  Neurologic: No dizziness, blackouts, seizures. No recent symptoms of stroke or mini- stroke. No recent episodes of slurred speech, or temporary blindness.  Cardiac: No recent episodes of chest pain/pressure, no shortness of breath at rest.  No shortness of breath with exertion.  Denies history of atrial fibrillation or irregular heartbeat  Vascular: No history of rest pain in feet.  No history of claudication.  No history of non-healing ulcer, No history of DVT   Pulmonary: No home oxygen, no productive cough, no  hemoptysis,  No asthma or wheezing  Musculoskeletal:  [ ]  Arthritis, [X]  Low back pain,  [X]  Joint pain  Hematologic:No history of hypercoagulable state.  No history of easy bleeding.  No history of anemia  Gastrointestinal: No hematochezia or melena,  No gastroesophageal reflux, no trouble swallowing  Urinary: [X]  chronic Kidney disease, [ ]  on HD - [ ]  MWF or [ ]  TTHS, [ ]  Burning with urination, [ ]  Frequent urination, [ ]  Difficulty urinating;   Skin: No rashes  Psychological: No history of anxiety,  No history of depression   Physical Examination  Vitals:   05/11/16 1106 05/11/16 1108  BP: (!) 145/92 (!) 141/89  Pulse: 74   Resp: 20   Temp: 97.7 F (36.5 C)   TempSrc: Oral   SpO2: 96%   Weight: 243 lb 3.2 oz (110.3 kg)   Height: 5\' 4"  (1.626 m)     Body mass index is 41.75 kg/m.  General:  Alert and oriented, no acute distress HEENT: Normal Neck: No bruit or JVD Pulmonary: Clear to auscultation bilaterally Cardiac: Regular Rate and Rhythm without murmur Abdomen: Soft, non-tender, non-distended, no mass Skin: No rash Extremity Pulses:  2+ radial, brachial  pulses bilaterally Musculoskeletal: No deformity or edema  Neurologic: Upper and lower extremity motor 5/5 and symmetric  DATA: She had bilateral upper extremity vein mapping and bilateral upper extremity arterial duplex exam today. Radial artery was 2 mm at the wrist bilaterally. Brachial artery was 5-6 m. Normal location.  Right cephalic vein was 3-6 mm from the distal forearm to the upper arm. Right basilic vein was 3-5 mm. Left cephalic vein was less than 2 mm at the wrist 3-5 mm in the upper arm   ASSESSMENT:  Patient needs long-term hemodialysis access. Anatomy is best suited for a left brachiocephalic AV fistula.   PLAN:  Risks benefits possible complications and procedure details including but not limited to non-maturation  of the fistula, bleeding, infection, ischemic steal were explained to the  patient today as well as his wife. They understand and agree to proceed. This is scheduled for 05/22/2016.   Ruta Hinds, MD [facility] Office: [phone] Pager: [phone]

## 2016-05-12 ENCOUNTER — Ambulatory Visit
Admission: RE | Admit: 2016-05-12 | Discharge: 2016-05-12 | Disposition: A | Discharge status: Home / Self Care | Payer: Medicare Other | Source: Ambulatory Visit | Attending: Orthopedic Surgery | Admitting: Orthopedic Surgery

## 2016-05-12 ENCOUNTER — Encounter: Payer: Self-pay | Admitting: Nephrology

## 2016-05-12 DIAGNOSIS — M5412 Radiculopathy, cervical region: Secondary | ICD-10-CM

## 2016-05-12 DIAGNOSIS — M542 Cervicalgia: Secondary | ICD-10-CM

## 2016-05-12 DIAGNOSIS — M4802 Spinal stenosis, cervical region: Secondary | ICD-10-CM | POA: Diagnosis not present

## 2016-05-12 MED ORDER — ONDANSETRON HCL 4 MG/2ML IJ SOLN: 4.0000 mg | Freq: Four times a day (QID) | INTRAMUSCULAR | Status: DC | PRN

## 2016-05-12 MED ORDER — IOPAMIDOL (ISOVUE-M 300) INJECTION 61%
10.0000 mL | Freq: Once | INTRAMUSCULAR | Status: AC | PRN
  Administered 2016-05-12: 10 mL via INTRATHECAL

## 2016-05-12 MED ORDER — DIAZEPAM 5 MG PO TABS
5.0000 mg | ORAL_TABLET | Freq: Once | ORAL | Status: AC
  Administered 2016-05-12: 5 mg via ORAL

## 2016-05-12 NOTE — Discharge Instructions (Signed)

## 2016-05-15 ENCOUNTER — Telehealth: Payer: Self-pay

## 2016-05-15 DIAGNOSIS — M5412 Radiculopathy, cervical region: Secondary | ICD-10-CM | POA: Diagnosis not present

## 2016-05-15 NOTE — Telephone Encounter (Signed)
Spoke with patient's wife, [name] [name], who said her husband did fine after the myelogram here 05/12/16 and that he has no headache.  jkl

## 2016-05-17 NOTE — Telephone Encounter (Signed)
Closed encounter °

## 2016-05-22 ENCOUNTER — Encounter: Payer: Self-pay | Admitting: Gastroenterology

## 2016-05-24 ENCOUNTER — Ambulatory Visit (HOSPITAL_COMMUNITY): Payer: Medicare Other | Attending: Orthopedic Surgery | Admitting: Physical Therapy

## 2016-05-24 DIAGNOSIS — M5412 Radiculopathy, cervical region: Secondary | ICD-10-CM | POA: Insufficient documentation

## 2016-05-24 DIAGNOSIS — M6281 Muscle weakness (generalized): Secondary | ICD-10-CM | POA: Insufficient documentation

## 2016-05-24 NOTE — Therapy (Signed)
[place], [place], [address] Phone: [phone]   Fax:  [phone]  Physical Therapy Evaluation  Patient Details  Name: [name] [name] MRN: [id number] Date of Birth: [birth year] Referring Provider: Phylliss Bob  Encounter Date: 05/24/2016      PT End of Session - 05/24/16 0859    Visit Number 1   Number of Visits 12   Date for PT Re-Evaluation 06/23/16   Authorization Type [employer]   Authorization - Visit Number 1   Authorization - Number of Visits 10   PT Start Time 0815   PT Stop Time 6734   PT Time Calculation (min) 40 min   Activity Tolerance Patient tolerated treatment well      Past Medical History:  Diagnosis Date  . Arthritis   . Bell palsy   . CAD (coronary artery disease)    a. 2014 MV: abnl w/ infap ischemia; b. 03/2013 Cath: aneurysmal bleb in the LAD w/ otw nonobs dzs-->Med Rx.  . Chronic back pain   . Chronic knee pain    a. 09/2015 s/p R TKA.  Marland Kitchen Chronic pain   . Chronic shoulder pain   . Chronic sinusitis   . CKD (chronic kidney disease), stage IV ([place])    stage 4 per office visit note of Dr Lowanda Foster on 05/2015   . COPD (chronic obstructive pulmonary disease) ([place])   . Diabetes mellitus without complication ([place])   . Essential hypertension   . GERD (gastroesophageal reflux disease)   . Hepatomegaly    noted on noncontrast CT 2015  . Hyperlipidemia   . Lateral meniscus tear   . Obesity    Truncal  . Obstructive sleep apnea    cpap - settings at 4   . On home oxygen therapy    uses 2l when is going somewhere per patient   . Post traumatic stress disorder   . PUD (peptic ulcer disease)    remote, reports f/u EGD about 8 years ago unremarkable   . Reactive airway disease    related to exposure to chemical during 9/11  . Sinusitis   . Vitamin D deficiency     Past Surgical History:  Procedure Laterality Date  . ASAD LT SHOULDER  12/2008   left shoulder  . CATARACT  EXTRACTION W/PHACO Left 03/28/2016   Procedure: CATARACT EXTRACTION PHACO AND INTRAOCULAR LENS PLACEMENT LEFT EYE;  Surgeon: [name], MD;  Location: [facility];  Service: Ophthalmology;  Laterality: Left;  CDE: 4.77  . CATARACT EXTRACTION W/PHACO Right 04/11/2016   Procedure: CATARACT EXTRACTION PHACO AND INTRAOCULAR LENS PLACEMENT RIGHT EYE; CDE:  4.74;  Surgeon: [name], MD;  Location: [facility];  Service: Ophthalmology;  Laterality: Right;  . COLONOSCOPY  10/2008   Fields: Rectal polyp obliterated, not retrieved, hemorrhoids, single ascending colon diverticulum near the CV. Next colonoscopy April 2020  . COLONOSCOPY N/A 12/25/2014   SLF: 1. Colorectal polyps (2) removed 2. Small internal hemorrhoids 3. the left colon is severely redundant  . DOPPLER ECHOCARDIOGRAPHY    . ESOPHAGOGASTRODUODENOSCOPY N/A 12/25/2014   SLF: 1. Anemia most likely due to CRI, gastritis, gastric polyps 2. Moderate non-erosive gastriits and mild duodenitis.  3.TWo large gstric polyps removed.   Marland Kitchen EYE SURGERY  12/22/2010   tear duct probing-[place]  . FOREIGN BODY REMOVAL  03/29/2011   Procedure: REMOVAL FOREIGN BODY EXTREMITY;  Surgeon: Arther Abbott, MD;  Location: [facility];  Service: Orthopedics;  Laterality: Right;  Removal Foreign Body Right Thumb  . KNEE ARTHROSCOPY  10/2007   left  . KNEE ARTHROSCOPY WITH LATERAL MENISECTOMY Right 10/14/2015   Procedure: LEFT KNEE ARTHROSCOPY WITH PARTIAL LATERAL MENISECTOMY;  Surgeon: Carole Civil, MD;  Location: [facility];  Service: Orthopedics;  Laterality: Right;  . LEFT HEART CATHETERIZATION WITH CORONARY ANGIOGRAM N/A 03/28/2013   Procedure: LEFT HEART CATHETERIZATION WITH CORONARY ANGIOGRAM;  Surgeon: Leonie Man, MD;  Location: [facility] C[facility] LAB;  Service: Cardiovascular;  Laterality: N/A;  . NM MYOVIEW LTD    . PENILE PROSTHESIS IMPLANT N/A 08/16/2015   Procedure: PENILE PROTHESIS INFLATABLE, three piece, Excisional biopsy of Penile ulcer, Penile molding;  Surgeon: Carolan Clines, MD;  Location: [facility];  Service: Urology;  Laterality: N/A;  . QUADRICEPS TENDON REPAIR  07/21/2011   Procedure: REPAIR QUADRICEP TENDON;  Surgeon: Arther Abbott, MD;  Location: [facility];  Service: Orthopedics;  Laterality: Right;  . TOENAIL EXCISION     removed [id number]  . UMBILICAL HERNIA REPAIR  2007   [place]    There were no vitals filed for this visit.       Subjective Assessment - 05/24/16 0818    Subjective [name] states that his neck has been bothering him since March. The pain started going down his arm down to his ring and little finger.  He has constant numbness.     Pertinent History DM, HTN, OA,    How long can you sit comfortably? constant pain    How long can you stand comfortably? constant pain   How long can you walk comfortably? constant pain    Patient Stated Goals less pain,    Currently in Pain? Yes  worst pain is a 10; best 0   Pain Score 6    Pain Location Neck   Pain Orientation Upper;Mid;Lower   Pain Descriptors / Indicators Aching   Pain Type Chronic pain   Pain Onset More than a month ago   Pain Frequency Intermittent   Aggravating Factors  moving    Pain Relieving Factors nothing    Effect of Pain on Daily Activities increases            [facility] PT Assessment - 05/24/16 0001      Assessment   Medical Diagnosis cervical radiculopathy   Referring Provider Phylliss Bob   Onset Date/Surgical Date 09/15/15   Hand Dominance Right   Next MD Visit 06/22/2016   Prior Therapy not for this issue     Precautions   Precautions None     Restrictions   Weight Bearing Restrictions No     Balance Screen   Has the patient fallen in the past 6 months Yes   How many times? 1   Has the patient had a decrease in activity level because of a fear of falling?  Yes   Is the patient reluctant to leave their home because of a fear of falling?  No     Home Ecologist residence     Prior Function   Level  of Independence Independent   Vocation Retired   Leisure IT consultant   Overall Cognitive Status Within Functional Limits for tasks assessed     Observation/Other Assessments   Focus on Therapeutic Outcomes (FOTO)  57     Posture/Postural Control   Posture/Postural Control Postural limitations   Postural Limitations Rounded Shoulders;Forward head     ROM / Strength   AROM /  PROM / Strength AROM;Strength     AROM   AROM Assessment Site Cervical   Cervical Flexion wnl reps decreased hand symptom    Cervical Extension wnl reps do not change.    Cervical - Right Side Bend wnl reps do not change symptoms    Cervical - Left Side Bend wnl decreased hand sx    Cervical - Right Rotation 65   Cervical - Left Rotation 55     Strength   Overall Strength Comments Shoulder cleared for ROM and strength    Strength Assessment Site Hand;Cervical   Right/Left hand Right;Left   Right Hand Grip (lbs) 85   Left Hand Grip (lbs) 95   Cervical Extension 3/5   Cervical - Right Side Bend 3-/5   Cervical - Left Side Bend 3-/5     Palpation   Palpation comment mild to moderate spasms in bilaterall upper trap                    [facility] Adult PT Treatment/Exercise - 05/24/16 0001      Exercises   Exercises Neck     Neck Exercises: Seated   Neck Retraction 10 reps   Postural Training sit as tall as possible    Other Seated Exercise scapular retraction, Lt sidebend x 10                 PT Education - 05/24/16 0855    Education provided Yes   Education Details HEP   Person(s) Educated Patient   Methods Explanation;Demonstration   Comprehension Verbalized understanding;Returned demonstration          PT Short Term Goals - 05/24/16 1037      PT SHORT TERM GOAL #1   Title Pt to have no radicular symptoms past her elbow to demonstrate decrease nerve irritation    Time 3   Period Weeks   Status New     PT SHORT TERM GOAL #2   Title PT to be demonstrating  good sitting and standing posture to reduce stress on cervical tissue   Time 3   Period Weeks   Status New     PT SHORT TERM GOAL #3   Title Pt left rotation to be to 65 degrees to improve ability to see blindside while driving    Time 3   Period Weeks           PT Long Term Goals - 05/24/16 1039      PT LONG TERM GOAL #1   Title Pt cervical strength to be at least  a 4/10 to allow pt to lift a 15 # item overhead and/or lower it for functional lifting without pain    Time 6   Period Weeks   Status New     PT LONG TERM GOAL #2   Title Pt to state he is no longer experiencing radicular symptoms to demonstrate decreased nerve irritation.    Time 6   Period Weeks   Status New     PT LONG TERM GOAL #3   Title Pt  to demonstrate the ability to stabilize his cervical spine so that his cervical pain is no greater than a 3/10 while completing functional tasks    Time 6   Period Weeks               Plan - 05/24/16 1051    Clinical Impression Statement Mr. Cherubini is a 65 yo male who has been experiencing cervical pain  since March.  In the past three months the pain has progressed down his arm.  he is being referred to skilled physical therapy to attempt to decrease his symptoms.  Examination demonstrates decreased strength, poor posture, decreased ROM, increased mm spasm and increased pain.  Mr. Chuba will benefit from skilled physical therapy to address these issues and maximize his functional ability and overal comfort.     Rehab Potential Good   PT Frequency 2x / week   PT Duration 6 weeks   PT Treatment/Interventions Electrical Stimulation;Moist Heat;Traction;Ultrasound;Therapeutic activities;Therapeutic exercise;Patient/family education;Manual techniques   PT Next Visit Plan begin t-band postural exercises, W-back making sure cervical area stays stabilized as well cervical isometric and manual    PT Home Exercise Plan Lt side bend, cervical and scapular  retraction,        Patient will benefit from skilled therapeutic intervention in order to improve the following deficits and impairments:  Decreased activity tolerance, Decreased strength, Decreased range of motion, Postural dysfunction, Pain  Visit Diagnosis: Radiculopathy, cervical region - Plan: PT plan of care cert/re-cert  Muscle weakness (generalized) - Plan: PT plan of care cert/re-cert      G-Codes - 78/93/81 0900    Functional Limitation Carrying, moving and handling objects   Carrying, Moving and Handling Objects Current Status (O1751) At least 40 percent but less than 60 percent impaired, limited or restricted   Carrying, Moving and Handling Objects Goal Status (W2585) At least 20 percent but less than 40 percent impaired, limited or restricted       Problem List Patient Active Problem List   Diagnosis Date Noted  . Morbid obesity due to excess calories ([place]) 03/22/2016  . CAD (coronary artery disease)   . Erectile dysfunction 08/16/2015  . Hemorrhoids 07/05/2015  . Vitamin D deficiency 06/04/2015  . OSA on CPAP 05/14/2015  . Anemia in chronic kidney disease 03/24/2015  . Fatty liver 12/01/2014  . Back pain 06/05/2013  . OA (osteoarthritis) of knee 03/15/2011  . CTS (carpal tunnel syndrome) 03/15/2011  . Type 2 diabetes mellitus with stage 4 chronic kidney disease, with long-term current use of insulin ([place]) 09/29/2008  . Hyperlipidemia 09/29/2008  . Essential hypertension 09/29/2008  . GERD 09/29/2008    Rayetta Humphrey, PT CLT [phone] 05/24/2016, 10:59 AM  [place], [place], [address] Phone: [phone]   Fax:  [phone]  Name: DOMNIQUE VANTINE MRN: [id number] Date of Birth: 04-08-1951

## 2016-05-24 NOTE — Patient Instructions (Addendum)
AROM: Lateral Neck Flexion    Slowly tilt head toward left  shoulder, . Hold each position __2__ seconds. Repeat _10___ times per set. Do _1___ sets per session. Do __2__ sessions per day.  http://orth.exer.us/296   Copyright  VHI. All rights reserved.  AROM: Neck Flexion    Bend head forward. Hold __2__ seconds. Repeat _10___ times per set. Do __1__ sets per session. Do ___2_ sessions per day. If this starts to increase pain discontinue this exercise  http://orth.exer.us/298   Copyright  VHI. All rights reserved.  Flexibility: Neck Retraction    Pull head straight back, keeping eyes and jaw level. Repeat _10___ times per set. Do _1___ sets per session. Do __2__ sessions per day.  http://orth.exer.us/344   Copyright  VHI. All rights reserved.  Scapular Retraction (Standing)    With arms at sides, pinch shoulder blades together. Repeat ___10_ times per set. Do _1___ sets per session. Do __2__ sessions per day.  http://orth.exer.us/944   Copyright  VHI. All rights reserved.

## 2016-05-25 ENCOUNTER — Other Ambulatory Visit: Payer: Self-pay | Admitting: "Endocrinology

## 2016-05-30 ENCOUNTER — Other Ambulatory Visit: Payer: Self-pay

## 2016-05-30 DIAGNOSIS — K760 Fatty (change of) liver, not elsewhere classified: Secondary | ICD-10-CM

## 2016-05-31 ENCOUNTER — Ambulatory Visit (HOSPITAL_COMMUNITY): Payer: Medicare Other | Admitting: Physical Therapy

## 2016-05-31 DIAGNOSIS — M6281 Muscle weakness (generalized): Secondary | ICD-10-CM

## 2016-05-31 DIAGNOSIS — M5412 Radiculopathy, cervical region: Secondary | ICD-10-CM

## 2016-05-31 NOTE — Therapy (Signed)
[name] [name], [place], [address] Phone: [phone]   Fax:  [phone]  Physical Therapy Treatment  Patient Details  Name: [name] [name] MRN: [id number] Date of Birth: October 01, 1950 Referring Provider: Phylliss [name]  Encounter Date: 05/31/2016      PT End of Session - 05/31/16 1047    Visit Number 2   Number of Visits 12   Date for PT Re-Evaluation 06/23/16   Authorization Type [employer]   Authorization - Visit Number 2   Authorization - Number of Visits 10   PT Start Time 1033   PT Stop Time 1113   PT Time Calculation (min) 40 min   Activity Tolerance Patient tolerated treatment well      Past Medical History:  Diagnosis Date  . Arthritis   . Bell palsy   . CAD (coronary artery disease)    a. 2014 MV: abnl w/ infap ischemia; b. 03/2013 Cath: aneurysmal bleb in the LAD w/ otw nonobs dzs-->Med Rx.  . Chronic back pain   . Chronic knee pain    a. 09/2015 s/p R TKA.  Marland Kitchen Chronic pain   . Chronic shoulder pain   . Chronic sinusitis   . CKD (chronic kidney disease), stage IV ([place])    stage 4 per office visit note of Dr [name] [name] on 05/2015   . COPD (chronic obstructive pulmonary disease) ([place])   . Diabetes mellitus without complication ([place])   . Essential hypertension   . GERD (gastroesophageal reflux disease)   . Hepatomegaly    noted on noncontrast CT 2015  . Hyperlipidemia   . Lateral meniscus tear   . Obesity    Truncal  . Obstructive sleep apnea    cpap - settings at 4   . On home oxygen therapy    uses 2l when is going somewhere per patient   . Post traumatic stress disorder   . PUD (peptic ulcer disease)    remote, reports f/u EGD about 8 years ago unremarkable   . Reactive airway disease    related to exposure to chemical during 9/11  . Sinusitis   . Vitamin D deficiency     Past Surgical History:  Procedure Laterality Date  . ASAD LT SHOULDER  12/2008   left shoulder  . CATARACT  EXTRACTION W/PHACO Left 03/28/2016   Procedure: CATARACT EXTRACTION PHACO AND INTRAOCULAR LENS PLACEMENT LEFT EYE;  Surgeon: [name] Guys, MD;  Location: [facility];  Service: Ophthalmology;  Laterality: Left;  CDE: 4.77  . CATARACT EXTRACTION W/PHACO Right 04/11/2016   Procedure: CATARACT EXTRACTION PHACO AND INTRAOCULAR LENS PLACEMENT RIGHT EYE; CDE:  4.74;  Surgeon: [name] Guys, MD;  Location: [facility];  Service: Ophthalmology;  Laterality: Right;  . COLONOSCOPY  10/2008   Fields: Rectal polyp obliterated, not retrieved, hemorrhoids, single ascending colon diverticulum near the CV. Next colonoscopy April 2020  . COLONOSCOPY N/A 12/25/2014   SLF: 1. Colorectal polyps (2) removed 2. Small internal hemorrhoids 3. the left colon is severely redundant  . DOPPLER ECHOCARDIOGRAPHY    . ESOPHAGOGASTRODUODENOSCOPY N/A 12/25/2014   SLF: 1. Anemia most likely due to CRI, gastritis, gastric polyps 2. Moderate non-erosive gastriits and mild duodenitis.  3.TWo large gstric polyps removed.   Marland Kitchen EYE SURGERY  12/22/2010   tear duct probing-[place]  . FOREIGN BODY REMOVAL  03/29/2011   Procedure: REMOVAL FOREIGN BODY EXTREMITY;  Surgeon: [name] Abbott, MD;  Location: [facility];  Service: Orthopedics;  Laterality: Right;  Removal Foreign Body Right Thumb  . KNEE ARTHROSCOPY  10/2007   left  . KNEE ARTHROSCOPY WITH LATERAL MENISECTOMY Right 10/14/2015   Procedure: LEFT KNEE ARTHROSCOPY WITH PARTIAL LATERAL MENISECTOMY;  Surgeon: [name] Civil, MD;  Location: [facility];  Service: Orthopedics;  Laterality: Right;  . LEFT HEART CATHETERIZATION WITH CORONARY ANGIOGRAM N/A 03/28/2013   Procedure: LEFT HEART CATHETERIZATION WITH CORONARY ANGIOGRAM;  Surgeon: [name] Man, MD;  Location: [facility] CATH LAB;  Service: Cardiovascular;  Laterality: N/A;  . NM MYOVIEW LTD    . PENILE PROSTHESIS IMPLANT N/A 08/16/2015   Procedure: PENILE PROTHESIS INFLATABLE, three piece, Excisional biopsy of Penile ulcer, Penile molding;  Surgeon: [name] Clines, MD;  Location: [facility];  Service: Urology;  Laterality: N/A;  . QUADRICEPS TENDON REPAIR  07/21/2011   Procedure: REPAIR QUADRICEP TENDON;  Surgeon: [name] Abbott, MD;  Location: [facility];  Service: Orthopedics;  Laterality: Right;  . TOENAIL EXCISION     removed [id number]  . UMBILICAL HERNIA REPAIR  2007   [place]    There were no vitals filed for this visit.      Subjective Assessment - 05/31/16 1039    Subjective Mr. Soy states that his neck has been bothering him since March. The pain started going down his arm down to his ring and little finger.  He has constant numbness.     Pertinent History DM, HTN, OA,    How long can you sit comfortably? constant pain    How long can you stand comfortably? constant pain   How long can you walk comfortably? constant pain    Patient Stated Goals less pain,    Pain Score 4    Pain Location Neck   Pain Orientation Lower   Pain Descriptors / Indicators Aching   Pain Onset More than a month ago                         [facility] Adult PT Treatment/Exercise - 05/31/16 0001      Exercises   Exercises Neck     Neck Exercises: Theraband   Scapula Retraction 10 reps   Shoulder Extension 10 reps   Rows 10 reps     Neck Exercises: Seated   Cervical Isometrics Extension;Right lateral flexion;Left lateral flexion   Neck Retraction 10 reps   W Back 10 reps   W Back Weights (lbs) 3   Shoulder Shrugs 5 reps   Shoulder Shrugs Limitations up back and relax    Postural Training sit as tall as possible    Other Seated Exercise scapular retraction, Lt sidebend x 10      Manual Therapy   Manual Therapy Soft tissue mobilization;Manual Traction   Manual therapy comments done seperate from all other aspects of treatment    Soft tissue mobilization to reduce spasm and tightness to decrease pain                   PT Short Term Goals - 05/31/16 1048      PT SHORT TERM GOAL #1   Title Pt to have no radicular  symptoms past her elbow to demonstrate decrease nerve irritation    Time 3   Period Weeks   Status On-going     PT SHORT TERM GOAL #2   Title PT to be demonstrating good sitting and standing posture to reduce stress on cervical tissue   Time 3   Period Weeks   Status On-going  PT SHORT TERM GOAL #3   Title Pt left rotation to be to 65 degrees to improve ability to see blindside while driving    Time 3   Period Weeks   Status On-going           PT Long Term Goals - 05/31/16 1048      PT LONG TERM GOAL #1   Title Pt cervical strength to be at least  a 4/5 to allow pt to lift a 15 # item overhead and/or lower it for functional lifting without pain    Time 6   Period Weeks   Status On-going     PT LONG TERM GOAL #2   Title Pt to state he is no longer experiencing radicular symptoms to demonstrate decreased nerve irritation.    Time 6   Period Weeks   Status On-going     PT LONG TERM GOAL #3   Title Pt  to demonstrate the ability to stabilize his cervical spine so that his cervical pain is no greater than a 3/10 while completing functional tasks    Time 6   Period Weeks   Status On-going               Plan - 05/31/16 1113    Clinical Impression Statement Pt able to demonstrate good form with new exercises.  Tender to rt C2-3 area.    Rehab Potential Good   PT Frequency 2x / week   PT Duration 6 weeks   PT Treatment/Interventions Electrical Stimulation;Moist Heat;Traction;Ultrasound;Therapeutic activities;Therapeutic exercise;Patient/family education;Manual techniques   PT Next Visit Plan begin prone axial extension, rows and shoulder extension; update HEP   PT Home Exercise Plan Lt side bend, cervical and scapular  retraction,       Patient will benefit from skilled therapeutic intervention in order to improve the following deficits and impairments:  Decreased activity tolerance, Decreased strength, Decreased range of motion, Postural dysfunction,  Pain  Visit Diagnosis: Radiculopathy, cervical region  Muscle weakness (generalized)     Problem List Patient Active Problem List   Diagnosis Date Noted  . Morbid obesity due to excess calories ([place]) 03/22/2016  . CAD (coronary artery disease)   . Erectile dysfunction 08/16/2015  . Hemorrhoids 07/05/2015  . Vitamin D deficiency 06/04/2015  . OSA on CPAP 05/14/2015  . Anemia in chronic kidney disease 03/24/2015  . Fatty liver 12/01/2014  . Back pain 06/05/2013  . OA (osteoarthritis) of knee 03/15/2011  . CTS (carpal tunnel syndrome) 03/15/2011  . Type 2 diabetes mellitus with stage 4 chronic kidney disease, with long-term current use of insulin ([place]) 09/29/2008  . Hyperlipidemia 09/29/2008  . Essential hypertension 09/29/2008  . GERD 09/29/2008   Rayetta Humphrey, PT CLT [phone] 05/31/2016, 11:15 AM  [place] [address] [place], [place], [address] Phone: [phone]   Fax:  [phone]  Name: [name] [name] MRN: [id number] Date of Birth: 11-11-1950

## 2016-06-01 ENCOUNTER — Ambulatory Visit (HOSPITAL_COMMUNITY): Payer: Medicare Other | Admitting: Physical Therapy

## 2016-06-06 ENCOUNTER — Ambulatory Visit (HOSPITAL_COMMUNITY): Payer: Medicare Other | Admitting: Physical Therapy

## 2016-06-06 DIAGNOSIS — M5412 Radiculopathy, cervical region: Secondary | ICD-10-CM | POA: Diagnosis not present

## 2016-06-06 DIAGNOSIS — M6281 Muscle weakness (generalized): Secondary | ICD-10-CM | POA: Diagnosis not present

## 2016-06-06 NOTE — Patient Instructions (Signed)
SCAPULA: Retraction   Pinch shoulder blades together. Do not shrug shoulders. Hold _5__ seconds._10__ reps per set, _2__ sets per day   Isometric Lateral Flexion    Put right index finger on right temple. Gently try to move right ear toward shoulder, pushing against finger. Hold _5___ seconds. Repeat on other side. Push and release slowly. Repeat __10__ times. Do _2___ sessions per day.  .  Isometric Extension    Put index fingers gently on back of head. Slowly try to look toward ceiling. Push head into fingers for __5__ seconds. Push and release slowly. Repeat __10__ times. Do __2__ sessions per day.

## 2016-06-06 NOTE — Therapy (Signed)
[facility], [place], [address] Phone: [phone]   Fax:  [phone]  Physical Therapy Treatment  Patient Details  Name: [name] [name] MRN: [id number] Date of Birth: [birth year] Referring Provider: Phylliss Bob  Encounter Date: 06/06/2016      PT End of Session - 06/06/16 1052    Visit Number 3   Number of Visits 12   Date for PT Re-Evaluation 06/23/16   Authorization Type [employer] - Visit Number 3   Authorization - Number of Visits 10   PT Start Time 0902   PT Stop Time 0945   PT Time Calculation (min) 43 min   Activity Tolerance Patient tolerated treatment well      Past Medical History:  Diagnosis Date  . Arthritis   . Bell palsy   . CAD (coronary artery disease)    a. 2014 MV: abnl w/ infap ischemia; b. 03/2013 Cath: aneurysmal bleb in the LAD w/ otw nonobs dzs-->Med Rx.  . Chronic back pain   . Chronic knee pain    a. 09/2015 s/p R TKA.  Marland Kitchen Chronic pain   . Chronic shoulder pain   . Chronic sinusitis   . CKD (chronic kidney disease), stage IV ([place])    stage 4 per office visit note of Dr Lowanda Foster on 05/2015   . COPD (chronic obstructive pulmonary disease) ([place])   . Diabetes mellitus without complication ([place])   . Essential hypertension   . GERD (gastroesophageal reflux disease)   . Hepatomegaly    noted on noncontrast CT 2015  . Hyperlipidemia   . Lateral meniscus tear   . Obesity    Truncal  . Obstructive sleep apnea    cpap - settings at 4   . On home oxygen therapy    uses 2l when is going somewhere per patient   . Post traumatic stress disorder   . PUD (peptic ulcer disease)    remote, reports f/u EGD about 8 years ago unremarkable   . Reactive airway disease    related to exposure to chemical during 9/11  . Sinusitis   . Vitamin D deficiency     Past Surgical History:  Procedure Laterality Date  . ASAD LT SHOULDER  12/2008   left shoulder  . CATARACT  EXTRACTION W/PHACO Left 03/28/2016   Procedure: CATARACT EXTRACTION PHACO AND INTRAOCULAR LENS PLACEMENT LEFT EYE;  Surgeon: [name], MD;  Location: [facility];  Service: Ophthalmology;  Laterality: Left;  CDE: 4.77  . CATARACT EXTRACTION W/PHACO Right 04/11/2016   Procedure: CATARACT EXTRACTION PHACO AND INTRAOCULAR LENS PLACEMENT RIGHT EYE; CDE:  4.74;  Surgeon: [name], MD;  Location: [facility];  Service: Ophthalmology;  Laterality: Right;  . COLONOSCOPY  10/2008   Fields: Rectal polyp obliterated, not retrieved, hemorrhoids, single ascending colon diverticulum near the CV. Next colonoscopy April 2020  . COLONOSCOPY N/A 12/25/2014   SLF: 1. Colorectal polyps (2) removed 2. Small internal hemorrhoids 3. the left colon is severely redundant  . DOPPLER ECHOCARDIOGRAPHY    . ESOPHAGOGASTRODUODENOSCOPY N/A 12/25/2014   SLF: 1. Anemia most likely due to CRI, gastritis, gastric polyps 2. Moderate non-erosive gastriits and mild duodenitis.  3.TWo large gstric polyps removed.   Marland Kitchen EYE SURGERY  12/22/2010   tear duct probing-[place]  . FOREIGN BODY REMOVAL  03/29/2011   Procedure: REMOVAL FOREIGN BODY EXTREMITY;  Surgeon: Arther Abbott, MD;  Location: [facility];  Service: Orthopedics;  Laterality: Right;  Removal Foreign Body Right Thumb  . KNEE ARTHROSCOPY  10/2007   left  . KNEE ARTHROSCOPY WITH LATERAL MENISECTOMY Right 10/14/2015   Procedure: LEFT KNEE ARTHROSCOPY WITH PARTIAL LATERAL MENISECTOMY;  Surgeon: Carole Civil, MD;  Location: [facility];  Service: Orthopedics;  Laterality: Right;  . LEFT HEART CATHETERIZATION WITH CORONARY ANGIOGRAM N/A 03/28/2013   Procedure: LEFT HEART CATHETERIZATION WITH CORONARY ANGIOGRAM;  Surgeon: Leonie Man, MD;  Location: [facility] CATH LAB;  Service: Cardiovascular;  Laterality: N/A;  . NM MYOVIEW LTD    . PENILE PROSTHESIS IMPLANT N/A 08/16/2015   Procedure: PENILE PROTHESIS INFLATABLE, three piece, Excisional biopsy of Penile ulcer, Penile molding;  Surgeon: Carolan Clines, MD;  Location: [facility];  Service: Urology;  Laterality: N/A;  . QUADRICEPS TENDON REPAIR  07/21/2011   Procedure: REPAIR QUADRICEP TENDON;  Surgeon: Arther Abbott, MD;  Location: [facility];  Service: Orthopedics;  Laterality: Right;  . TOENAIL EXCISION     removed [id number]  . UMBILICAL HERNIA REPAIR  2007   [place]    There were no vitals filed for this visit.      Subjective Assessment - 06/06/16 0901    Subjective Pt states he is doing alot better.  Currently no pain in his neck but still has the tingling that remains constant down his Lt UE to his ring and little finger.   Currently in Pain? No/denies                         [facility] Adult PT Treatment/Exercise - 06/06/16 0001      Neck Exercises: Theraband   Scapula Retraction 15 reps;Green   Shoulder Extension 15 reps;Green   Rows 15 reps;Green     Neck Exercises: Seated   Cervical Isometrics Extension;Right lateral flexion;Left lateral flexion   Cervical Isometrics Limitations 5 reps each 5" holds   W Back 10 reps   W Back Weights (lbs) 3   Shoulder Shrugs 10 reps   Shoulder Shrugs Limitations up back and relax    Other Seated Exercise scapular retraction, Lt sidebend x 10      Neck Exercises: Prone   Axial Exentsion 10 reps   Shoulder Extension 10 reps   Upper Extremity Flexion with Stabilization 5 reps   Other Prone Exercise rows 10 reps     Manual Therapy   Manual Therapy Soft tissue mobilization;Manual Traction   Manual therapy comments done seperate from all other aspects of treatment    Soft tissue mobilization to reduce spasm and tightness to decrease pain    Manual Traction supine to cervical 5X30" holds                  PT Short Term Goals - 05/31/16 1048      PT SHORT TERM GOAL #1   Title Pt to have no radicular symptoms past her elbow to demonstrate decrease nerve irritation    Time 3   Period Weeks   Status On-going     PT SHORT TERM GOAL #2   Title PT to  be demonstrating good sitting and standing posture to reduce stress on cervical tissue   Time 3   Period Weeks   Status On-going     PT SHORT TERM GOAL #3   Title Pt left rotation to be to 65 degrees to improve ability to see blindside while driving    Time 3   Period Weeks   Status On-going  PT Long Term Goals - 05/31/16 1048      PT LONG TERM GOAL #1   Title Pt cervical strength to be at least  a 4/5 to allow pt to lift a 15 # item overhead and/or lower it for functional lifting without pain    Time 6   Period Weeks   Status On-going     PT LONG TERM GOAL #2   Title Pt to state he is no longer experiencing radicular symptoms to demonstrate decreased nerve irritation.    Time 6   Period Weeks   Status On-going     PT LONG TERM GOAL #3   Title Pt  to demonstrate the ability to stabilize his cervical spine so that his cervical pain is no greater than a 3/10 while completing functional tasks    Time 6   Period Weeks   Status On-going               Plan - 06/06/16 1054    Clinical Impression Statement continued with focus on improving postural/cervical strength and decreasing radiculopathy.  Pt required cues to complete theraband with controlled motion.  Updated HEP to include cervical isometrics and scapular retractions.  Added prone exercises to further improve scapular strength.   Rehab Potential Good   PT Frequency 2x / week   PT Duration 6 weeks   PT Treatment/Interventions Electrical Stimulation;Moist Heat;Traction;Ultrasound;Therapeutic activities;Therapeutic exercise;Patient/family education;Manual techniques   PT Next Visit Plan Continue with exercise progression.  Assess tightness of scalenes; discuss with evaluating PT trial of mechanical traction.   PT Home Exercise Plan Lt side bend, cervical and scapular  retraction, 11/21: cervical isometics extension, lateral sidebends, scapular retractions      Patient will benefit from skilled  therapeutic intervention in order to improve the following deficits and impairments:  Decreased activity tolerance, Decreased strength, Decreased range of motion, Postural dysfunction, Pain  Visit Diagnosis: Radiculopathy, cervical region     Problem List Patient Active Problem List   Diagnosis Date Noted  . Morbid obesity due to excess calories ([place]) 03/22/2016  . CAD (coronary artery disease)   . Erectile dysfunction 08/16/2015  . Hemorrhoids 07/05/2015  . Vitamin D deficiency 06/04/2015  . OSA on CPAP 05/14/2015  . Anemia in chronic kidney disease 03/24/2015  . Fatty liver 12/01/2014  . Back pain 06/05/2013  . OA (osteoarthritis) of knee 03/15/2011  . CTS (carpal tunnel syndrome) 03/15/2011  . Type 2 diabetes mellitus with stage 4 chronic kidney disease, with long-term current use of insulin ([place]) 09/29/2008  . Hyperlipidemia 09/29/2008  . Essential hypertension 09/29/2008  . GERD 09/29/2008    Teena Irani, PTA/CLT [phone]  06/06/2016, 11:07 AM  [facility] [address] [place], [place], [address] Phone: [phone]   Fax:  [phone]  Name: [name] [name] MRN: [id number] Date of Birth: 24-Jun-1951

## 2016-06-12 DIAGNOSIS — M542 Cervicalgia: Secondary | ICD-10-CM | POA: Diagnosis not present

## 2016-06-13 ENCOUNTER — Ambulatory Visit (HOSPITAL_COMMUNITY): Payer: Medicare Other | Admitting: Physical Therapy

## 2016-06-13 DIAGNOSIS — M5412 Radiculopathy, cervical region: Secondary | ICD-10-CM

## 2016-06-13 DIAGNOSIS — M6281 Muscle weakness (generalized): Secondary | ICD-10-CM

## 2016-06-13 NOTE — Therapy (Signed)
[place], [place], [address] Phone: [phone]   Fax:  [phone]  Physical Therapy Treatment  Patient Details  Name: [name] [name] MRN: [id number] Date of Birth: 1951-06-19 Referring Provider: Phylliss Bob  Encounter Date: 06/13/2016      PT End of Session - 06/13/16 1009    Visit Number 4   Number of Visits 12   Date for PT Re-Evaluation 06/23/16   Authorization Type [employer] - Visit Number 4   Authorization - Number of Visits 10   PT Start Time [phone]   PT Stop Time 1028   PT Time Calculation (min) 40 min   Activity Tolerance Patient tolerated treatment well      Past Medical History:  Diagnosis Date  . Arthritis   . Bell palsy   . CAD (coronary artery disease)    a. 2014 MV: abnl w/ infap ischemia; b. 03/2013 Cath: aneurysmal bleb in the LAD w/ otw nonobs dzs-->Med Rx.  . Chronic back pain   . Chronic knee pain    a. 09/2015 s/p R TKA.  Marland Kitchen Chronic pain   . Chronic shoulder pain   . Chronic sinusitis   . CKD (chronic kidney disease), stage IV ([place])    stage 4 per office visit note of Dr Lowanda Foster on 05/2015   . COPD (chronic obstructive pulmonary disease) ([place])   . Diabetes mellitus without complication ([place])   . Essential hypertension   . GERD (gastroesophageal reflux disease)   . Hepatomegaly    noted on noncontrast CT 2015  . Hyperlipidemia   . Lateral meniscus tear   . Obesity    Truncal  . Obstructive sleep apnea    cpap - settings at 4   . On home oxygen therapy    uses 2l when is going somewhere per patient   . Post traumatic stress disorder   . PUD (peptic ulcer disease)    remote, reports f/u EGD about 8 years ago unremarkable   . Reactive airway disease    related to exposure to chemical during 9/11  . Sinusitis   . Vitamin D deficiency     Past Surgical History:  Procedure Laterality Date  . ASAD LT SHOULDER  12/2008   left shoulder  . CATARACT  EXTRACTION W/PHACO Left 03/28/2016   Procedure: CATARACT EXTRACTION PHACO AND INTRAOCULAR LENS PLACEMENT LEFT EYE;  Surgeon: [name], MD;  Location: [facility];  Service: Ophthalmology;  Laterality: Left;  CDE: 4.77  . CATARACT EXTRACTION W/PHACO Right 04/11/2016   Procedure: CATARACT EXTRACTION PHACO AND INTRAOCULAR LENS PLACEMENT RIGHT EYE; CDE:  4.74;  Surgeon: [name], MD;  Location: [facility];  Service: Ophthalmology;  Laterality: Right;  . COLONOSCOPY  10/2008   Fields: Rectal polyp obliterated, not retrieved, hemorrhoids, single ascending colon diverticulum near the CV. Next colonoscopy April 2020  . COLONOSCOPY N/A 12/25/2014   SLF: 1. Colorectal polyps (2) removed 2. Small internal hemorrhoids 3. the left colon is severely redundant  . DOPPLER ECHOCARDIOGRAPHY    . ESOPHAGOGASTRODUODENOSCOPY N/A 12/25/2014   SLF: 1. Anemia most likely due to CRI, gastritis, gastric polyps 2. Moderate non-erosive gastriits and mild duodenitis.  3.TWo large gstric polyps removed.   Marland Kitchen EYE SURGERY  12/22/2010   tear duct probing-[place]  . FOREIGN BODY REMOVAL  03/29/2011   Procedure: REMOVAL FOREIGN BODY EXTREMITY;  Surgeon: Arther Abbott, MD;  Location: [facility];  Service: Orthopedics;  Laterality: Right;  Removal Foreign Body Right Thumb  . KNEE ARTHROSCOPY  10/2007   left  . KNEE ARTHROSCOPY WITH LATERAL MENISECTOMY Right 10/14/2015   Procedure: LEFT KNEE ARTHROSCOPY WITH PARTIAL LATERAL MENISECTOMY;  Surgeon: Carole Civil, MD;  Location: [facility];  Service: Orthopedics;  Laterality: Right;  . LEFT HEART CATHETERIZATION WITH CORONARY ANGIOGRAM N/A 03/28/2013   Procedure: LEFT HEART CATHETERIZATION WITH CORONARY ANGIOGRAM;  Surgeon: Leonie Man, MD;  Location: [facility] CATH LAB;  Service: Cardiovascular;  Laterality: N/A;  . NM MYOVIEW LTD    . PENILE PROSTHESIS IMPLANT N/A 08/16/2015   Procedure: PENILE PROTHESIS INFLATABLE, three piece, Excisional biopsy of Penile ulcer, Penile molding;  Surgeon: Carolan Clines, MD;  [facility];  Service: Urology;  Laterality: N/A;  . QUADRICEPS TENDON REPAIR  07/21/2011   Procedure: REPAIR QUADRICEP TENDON;  Surgeon: Arther Abbott, MD;  Location: [facility];  Service: Orthopedics;  Laterality: Right;  . TOENAIL EXCISION     removed [id number]  . UMBILICAL HERNIA REPAIR  2007   [place]    There were no vitals filed for this visit.      Subjective Assessment - 06/13/16 0950    Subjective Pt states that he is doing better.  He has no pain today.    Pertinent History DM, HTN, OA,    How long can you sit comfortably? constant pain    How long can you stand comfortably? constant pain   How long can you walk comfortably? constant pain    Patient Stated Goals less pain,    Currently in Pain? No/denies   Pain Onset More than a month ago                         [facility] Adult PT Treatment/Exercise - 06/13/16 0001      Neck Exercises: Theraband   Scapula Retraction 15 reps;Green   Shoulder Extension 15 reps;Green   Rows 15 reps;Green     Neck Exercises: Standing   Wall Push Ups 10 reps     Neck Exercises: Seated   Cervical Isometrics Extension;Right lateral flexion;Left lateral flexion   Cervical Isometrics Limitations 10 reps each 5" holds   Neck Retraction 10 reps   X to V 10 reps   W Back 10 reps   W Back Weights (lbs) 3   Shoulder Shrugs 10 reps   Shoulder Shrugs Limitations up back and relax      Neck Exercises: Sidelying   Lateral Flexion Right;Left;10 reps     Neck Exercises: Prone   Axial Exentsion 10 reps   Shoulder Extension 10 reps   Shoulder Extension Weights (lbs) 3   Other Prone Exercise rows 10 reps   Other Prone Exercise 3#      Neck Exercises: Stretches   Neck Stretch 3 reps;20 seconds                PT Education - 06/13/16 1030    Education provided Yes   Education Details home T-band exercises, sidelying Sidebend for strengthening    Person(s) Educated Patient   Methods Explanation    Comprehension Verbalized understanding;Returned demonstration          PT Short Term Goals - 06/13/16 1011      PT SHORT TERM GOAL #1   Title Pt to have no radicular symptoms past her elbow to demonstrate decrease nerve irritation    Baseline pain free for a couple of days    Time 3  Period Weeks   Status Achieved     PT SHORT TERM GOAL #2   Title PT to be demonstrating good sitting and standing posture to reduce stress on cervical tissue   Time 3   Period Weeks   Status Achieved     PT SHORT TERM GOAL #3   Title Pt left rotation to be to 65 degrees to improve ability to see blindside while driving    Time 3   Period Weeks   Status Achieved           PT Long Term Goals - 06/13/16 1014      PT LONG TERM GOAL #1   Title Pt cervical strength to be at least  a 4/5 to allow pt to lift a 15 # item overhead and/or lower it for functional lifting without pain    Time 6   Period Weeks   Status On-going     PT LONG TERM GOAL #2   Title Pt to state he is no longer experiencing radicular symptoms to demonstrate decreased nerve irritation.    Time 6   Period Weeks   Status On-going     PT LONG TERM GOAL #3   Title Pt  to demonstrate the ability to stabilize his cervical spine so that his cervical pain is no greater than a 3/10 while completing functional tasks    Time 6   Period Weeks   Status On-going               Plan - 06/13/16 1009    Clinical Impression Statement Pt to departement with no pain.  Demonstrates good form during exercises. Added wall pushup and x to V for improved stability.   No manual done today due to no complaint of pain.  May discharge early if pt remains painfree   Rehab Potential Good   PT Frequency 2x / week   PT Duration 6 weeks   PT Treatment/Interventions Electrical Stimulation;Moist Heat;Traction;Ultrasound;Therapeutic activities;Therapeutic exercise;Patient/family education;Manual techniques   PT Next Visit Plan Continue with  exercise progression.     PT Home Exercise Plan Lt side bend, cervical and scapular  retraction, 11/21: cervical isometics extension, lateral sidebends, scapular retractions      Patient will benefit from skilled therapeutic intervention in order to improve the following deficits and impairments:  Decreased activity tolerance, Decreased strength, Decreased range of motion, Postural dysfunction, Pain  Visit Diagnosis: Radiculopathy, cervical region  Muscle weakness (generalized)     Problem List Patient Active Problem List   Diagnosis Date Noted  . Morbid obesity due to excess calories ([place]) 03/22/2016  . CAD (coronary artery disease)   . Erectile dysfunction 08/16/2015  . Hemorrhoids 07/05/2015  . Vitamin D deficiency 06/04/2015  . OSA on CPAP 05/14/2015  . Anemia in chronic kidney disease 03/24/2015  . Fatty liver 12/01/2014  . Back pain 06/05/2013  . OA (osteoarthritis) of knee 03/15/2011  . CTS (carpal tunnel syndrome) 03/15/2011  . Type 2 diabetes mellitus with stage 4 chronic kidney disease, with long-term current use of insulin ([place]) 09/29/2008  . Hyperlipidemia 09/29/2008  . Essential hypertension 09/29/2008  . GERD 09/29/2008   Rayetta Humphrey, PT CLT [phone] 06/13/2016, 10:31 AM  [facility] [address] [place], [place], [address] Phone: [phone]   Fax:  [phone]  Name: ALBIN DUCKETT MRN: [id number] Date of Birth: [birth year]

## 2016-06-15 ENCOUNTER — Ambulatory Visit (HOSPITAL_COMMUNITY): Payer: Medicare Other | Admitting: Physical Therapy

## 2016-06-15 DIAGNOSIS — M6281 Muscle weakness (generalized): Secondary | ICD-10-CM | POA: Diagnosis not present

## 2016-06-15 DIAGNOSIS — M5412 Radiculopathy, cervical region: Secondary | ICD-10-CM | POA: Diagnosis not present

## 2016-06-15 NOTE — Therapy (Signed)
[place], [place], [address] Phone: [phone]   Fax:  [phone]  Physical Therapy Treatment  Patient Details  Name: [name] [name] MRN: [id number] Date of Birth: [birth year] Referring Provider: Phylliss Bob  Encounter Date: 06/15/2016      PT End of Session - 06/15/16 1131    Visit Number 5   Number of Visits 12   Date for PT Re-Evaluation 06/23/16   Authorization Type [employer] - Visit Number 5   Authorization - Number of Visits 10   PT Start Time 1120   PT Stop Time 1145   PT Time Calculation (min) 25 min   Activity Tolerance Patient tolerated treatment well      Past Medical History:  Diagnosis Date  . Arthritis   . Bell palsy   . CAD (coronary artery disease)    a. 2014 MV: abnl w/ infap ischemia; b. 03/2013 Cath: aneurysmal bleb in the LAD w/ otw nonobs dzs-->Med Rx.  . Chronic back pain   . Chronic knee pain    a. 09/2015 s/p R TKA.  Marland Kitchen Chronic pain   . Chronic shoulder pain   . Chronic sinusitis   . CKD (chronic kidney disease), stage IV ([place])    stage 4 per office visit note of Dr Lowanda Foster on 05/2015   . COPD (chronic obstructive pulmonary disease) ([place])   . Diabetes mellitus without complication ([place])   . Essential hypertension   . GERD (gastroesophageal reflux disease)   . Hepatomegaly    noted on noncontrast CT 2015  . Hyperlipidemia   . Lateral meniscus tear   . Obesity    [place]  . Obstructive sleep apnea    cpap - settings at 4   . On home oxygen therapy    uses 2l when is going somewhere per patient   . Post traumatic stress disorder   . PUD (peptic ulcer disease)    remote, reports f/u EGD about 8 years ago unremarkable   . Reactive airway disease    related to exposure to chemical during 9/11  . Sinusitis   . Vitamin D deficiency     Past Surgical History:  Procedure Laterality Date  . ASAD LT SHOULDER  12/2008   left shoulder  . CATARACT  EXTRACTION W/PHACO Left 03/28/2016   Procedure: CATARACT EXTRACTION PHACO AND INTRAOCULAR LENS PLACEMENT LEFT EYE;  Surgeon: [name], MD;  Location: [facility];  Service: Ophthalmology;  Laterality: Left;  CDE: 4.77  . CATARACT EXTRACTION W/PHACO Right 04/11/2016   Procedure: CATARACT EXTRACTION PHACO AND INTRAOCULAR LENS PLACEMENT RIGHT EYE; CDE:  4.74;  Surgeon: [name], MD;  Location: [facility];  Service: Ophthalmology;  Laterality: Right;  . COLONOSCOPY  10/2008   Fields: Rectal polyp obliterated, not retrieved, hemorrhoids, single ascending colon diverticulum near the CV. Next colonoscopy April 2020  . COLONOSCOPY N/A 12/25/2014   SLF: 1. Colorectal polyps (2) removed 2. Small internal hemorrhoids 3. the left colon is severely redundant  . DOPPLER ECHOCARDIOGRAPHY    . ESOPHAGOGASTRODUODENOSCOPY N/A 12/25/2014   SLF: 1. Anemia most likely due to CRI, gastritis, gastric polyps 2. Moderate non-erosive gastriits and mild duodenitis.  3.TWo large gstric polyps removed.   Marland Kitchen EYE SURGERY  12/22/2010   tear duct probing-[place]  . FOREIGN BODY REMOVAL  03/29/2011   Procedure: REMOVAL FOREIGN BODY EXTREMITY;  Surgeon: Arther Abbott, MD;  Location: [facility];  Service: Orthopedics;  Laterality: Right;  Removal Foreign Body Right Thumb  . KNEE ARTHROSCOPY  10/2007   left  . KNEE ARTHROSCOPY WITH LATERAL MENISECTOMY Right 10/14/2015   Procedure: LEFT KNEE ARTHROSCOPY WITH PARTIAL LATERAL MENISECTOMY;  Surgeon: Carole Civil, MD;  Location: [facility];  Service: Orthopedics;  Laterality: Right;  . LEFT HEART CATHETERIZATION WITH CORONARY ANGIOGRAM N/A 03/28/2013   Procedure: LEFT HEART CATHETERIZATION WITH CORONARY ANGIOGRAM;  Surgeon: Leonie Man, MD;  Location: [facility] CATH LAB;  Service: Cardiovascular;  Laterality: N/A;  . NM MYOVIEW LTD    . PENILE PROSTHESIS IMPLANT N/A 08/16/2015   Procedure: PENILE PROTHESIS INFLATABLE, three piece, Excisional biopsy of Penile ulcer, Penile molding;  Surgeon: Carolan Clines, MD;  Location: [facility];  Service: Urology;  Laterality: N/A;  . QUADRICEPS TENDON REPAIR  07/21/2011   Procedure: REPAIR QUADRICEP TENDON;  Surgeon: Arther Abbott, MD;  Location: [facility];  Service: Orthopedics;  Laterality: Right;  . TOENAIL EXCISION     removed [id number]  . UMBILICAL HERNIA REPAIR  2007   [place]    There were no vitals filed for this visit.          [facility] PT Assessment - 06/15/16 0001      Assessment   Medical Diagnosis cervical radiculopathy   Onset Date/Surgical Date 09/15/15   Hand Dominance Right   Next MD Visit 06/22/2016   Prior Therapy not for this issue     Precautions   Precautions None     Restrictions   Weight Bearing Restrictions No     [place] residence     Prior Function   Level of [place] Retired   Leisure fishing      Cognition   Overall Cognitive Status Within Functional Limits for tasks assessed     Observation/Other Assessments   Focus on Therapeutic Outcomes (FOTO)  57     Posture/Postural Control   Posture/Postural Control Postural limitations   Postural Limitations Rounded Shoulders;Forward head     AROM   Cervical Flexion wnl reps decreased hand symptom    Cervical Extension wnl reps do not change.    Cervical - Right Side Bend wnl reps do not change symptoms    Cervical - Left Side Bend wnl decreased hand sx    Cervical - Right Rotation 75  was 65   Cervical - Left Rotation 75  was 55      Strength   Overall Strength Comments Shoulder cleared for ROM and strength    Right Hand Grip (lbs) 95  was 95   Left Hand Grip (lbs) 100  was 95   Cervical Extension 4/5  was 3/5    Cervical - Right Side Bend 4/5  was3-/5   Cervical - Left Side Bend 4/5  was 3-/5     Palpation   Palpation comment --  mild spasm was moderate                             PT Education - 06/15/16 1151    Education provided Yes    Education Details The importance of stretching, and strengthening as we get older.  Given 2 week membership to the [facility]; reviewed exercises.    Person(s) Educated Patient   Methods Explanation   Comprehension Verbalized understanding          PT Short Term Goals - 06/15/16 1131      PT SHORT  TERM GOAL #1   Title Pt to have no radicular symptoms past her elbow to demonstrate decrease nerve irritation    Baseline pain free for a couple of days    Time 3   Period Weeks   Status Achieved     PT SHORT TERM GOAL #2   Title PT to be demonstrating good sitting and standing posture to reduce stress on cervical tissue   Time 3   Period Weeks   Status Achieved     PT SHORT TERM GOAL #3   Title Pt left rotation to be to 65 degrees to improve ability to see blindside while driving    Time 3   Period Weeks   Status Achieved           PT Long Term Goals - 04-Jul-2016 1131      PT LONG TERM GOAL #1   Title Pt cervical strength to be at least  a 4/5 to allow pt to lift a 15 # item overhead and/or lower it for functional lifting without pain    Time 6   Period Weeks   Status Achieved     PT LONG TERM GOAL #2   Title Pt to state he is no longer experiencing radicular symptoms to demonstrate decreased nerve irritation.    Time 6   Period Weeks   Status Achieved     PT LONG TERM GOAL #3   Title Pt  to demonstrate the ability to stabilize his cervical spine so that his cervical pain is no greater than a 3/10 while completing functional tasks    Time 6   Period Weeks   Status Achieved               Plan - 2016-07-04 1152    Clinical Impression Statement Pt continues to be painfree states that he feels that he is ready for discharge.  Pt has made gains in all aspects and is ready for discharge.    Rehab Potential Good   PT Frequency 2x / week   PT Duration 6 weeks   PT Treatment/Interventions Electrical Stimulation;Moist Heat;Traction;Ultrasound;Therapeutic  activities;Therapeutic exercise;Patient/family education;Manual techniques   PT Next Visit Plan discharge pt    PT Home Exercise Plan Lt side bend, cervical and scapular  retraction, 11/21: cervical isometics extension, lateral sidebends, scapular retractions      Patient will benefit from skilled therapeutic intervention in order to improve the following deficits and impairments:  Decreased activity tolerance, Decreased strength, Decreased range of motion, Postural dysfunction, Pain  Visit Diagnosis: Muscle weakness (generalized)       G-Codes - 04-Jul-2016 1153    Functional Limitation Carrying, moving and handling objects   Carrying, Moving and Handling Objects Goal Status ([id number]) At least 20 percent but less than 40 percent impaired, limited or restricted   Carrying, Moving and Handling Objects Discharge Status [phone]) At least 1 percent but less than 20 percent impaired, limited or restricted      Problem List Patient Active Problem List   Diagnosis Date Noted  . Morbid obesity due to excess calories ([place]) 03/22/2016  . CAD (coronary artery disease)   . Erectile dysfunction 08/16/2015  . Hemorrhoids 07/05/2015  . Vitamin D deficiency 06/04/2015  . OSA on CPAP 05/14/2015  . Anemia in chronic kidney disease 03/24/2015  . Fatty liver 12/01/2014  . Back pain 06/05/2013  . OA (osteoarthritis) of knee 03/15/2011  . CTS (carpal tunnel syndrome) 03/15/2011  . Type 2 diabetes mellitus with stage  4 chronic kidney disease, with long-term current use of insulin ([place]) 09/29/2008  . Hyperlipidemia 09/29/2008  . Essential hypertension 09/29/2008  . GERD 09/29/2008    Rayetta Humphrey, PT CLT [phone] 06/15/2016, 11:54 AM  [place], [place], [address] Phone: [phone]   Fax:  [phone]  Name: [name] [name] MRN: [id number] Date of Birth: 1950-10-22   PHYSICAL THERAPY DISCHARGE SUMMARY  Visits from  Start of Care: 5  Current functional level related to goals / functional outcomes: See above   Remaining deficits: See above Education / Equipment: HEP Plan: Patient agrees to discharge.  Patient goals were met. Patient is being discharged due to meeting the stated rehab goals.  ?????        Rayetta Humphrey, [place] CLT [phone]

## 2016-06-16 ENCOUNTER — Other Ambulatory Visit: Payer: Self-pay | Admitting: "Endocrinology

## 2016-06-16 DIAGNOSIS — E785 Hyperlipidemia, unspecified: Secondary | ICD-10-CM | POA: Diagnosis not present

## 2016-06-16 DIAGNOSIS — I10 Essential (primary) hypertension: Secondary | ICD-10-CM | POA: Diagnosis not present

## 2016-06-16 DIAGNOSIS — E1165 Type 2 diabetes mellitus with hyperglycemia: Secondary | ICD-10-CM | POA: Diagnosis not present

## 2016-06-16 DIAGNOSIS — R809 Proteinuria, unspecified: Secondary | ICD-10-CM | POA: Diagnosis not present

## 2016-06-16 DIAGNOSIS — E559 Vitamin D deficiency, unspecified: Secondary | ICD-10-CM | POA: Diagnosis not present

## 2016-06-16 DIAGNOSIS — Z79899 Other long term (current) drug therapy: Secondary | ICD-10-CM | POA: Diagnosis not present

## 2016-06-16 DIAGNOSIS — N184 Chronic kidney disease, stage 4 (severe): Secondary | ICD-10-CM | POA: Diagnosis not present

## 2016-06-16 DIAGNOSIS — D519 Vitamin B12 deficiency anemia, unspecified: Secondary | ICD-10-CM | POA: Diagnosis not present

## 2016-06-16 DIAGNOSIS — E1122 Type 2 diabetes mellitus with diabetic chronic kidney disease: Secondary | ICD-10-CM | POA: Diagnosis not present

## 2016-06-16 DIAGNOSIS — Z794 Long term (current) use of insulin: Secondary | ICD-10-CM | POA: Diagnosis not present

## 2016-06-16 DIAGNOSIS — N183 Chronic kidney disease, stage 3 (moderate): Secondary | ICD-10-CM | POA: Diagnosis not present

## 2016-06-16 LAB — COMPLETE METABOLIC PANEL WITH GFR
ALBUMIN: 4 g/dL (ref 3.6–5.1)
ALK PHOS: 45 U/L (ref 40–115)
ALT: 28 U/L (ref 9–46)
AST: 22 U/L (ref 10–35)
BILIRUBIN TOTAL: 0.4 mg/dL (ref 0.2–1.2)
BUN: 40 mg/dL — AB (ref 7–25)
CO2: 24 mmol/L (ref 20–31)
CREATININE: 3.74 mg/dL — AB (ref 0.70–1.25)
Calcium: 9 mg/dL (ref 8.6–10.3)
Chloride: 107 mmol/L (ref 98–110)
GFR, Est African American: 18 mL/min — ABNORMAL LOW (ref >=60)
GFR, Est Non African American: 16 mL/min — ABNORMAL LOW (ref >=60)
GLUCOSE: 125 mg/dL — AB (ref 65–99)
Potassium: 4 mmol/L (ref 3.5–5.3)
SODIUM: 142 mmol/L (ref 135–146)
TOTAL PROTEIN: 7 g/dL (ref 6.1–8.1)

## 2016-06-16 LAB — LIPID PANEL
Cholesterol: 146 mg/dL (ref <200)
HDL: 36 mg/dL — ABNORMAL LOW (ref >40)
LDL CALC: 72 mg/dL (ref <100)
Total CHOL/HDL Ratio: 4.1 Ratio (ref <5.0)
Triglycerides: 190 mg/dL — ABNORMAL HIGH (ref <150)
VLDL: 38 mg/dL — ABNORMAL HIGH (ref <30)

## 2016-06-17 LAB — HEMOGLOBIN A1C
HEMOGLOBIN A1C: 7 % — AB (ref <5.7)
MEAN PLASMA GLUCOSE: 154 mg/dL

## 2016-06-20 ENCOUNTER — Ambulatory Visit (HOSPITAL_COMMUNITY): Payer: Medicare Other | Admitting: Physical Therapy

## 2016-06-22 ENCOUNTER — Ambulatory Visit (INDEPENDENT_AMBULATORY_CARE_PROVIDER_SITE_OTHER): Payer: Medicare Other | Admitting: "Endocrinology

## 2016-06-22 ENCOUNTER — Encounter: Payer: Self-pay | Admitting: "Endocrinology

## 2016-06-22 ENCOUNTER — Ambulatory Visit (HOSPITAL_COMMUNITY): Payer: Medicare Other | Admitting: Physical Therapy

## 2016-06-22 VITALS — BP 134/74 | HR 81 | Ht 64.0 in | Wt 250.0 lb

## 2016-06-22 DIAGNOSIS — Z794 Long term (current) use of insulin: Secondary | ICD-10-CM

## 2016-06-22 DIAGNOSIS — N184 Chronic kidney disease, stage 4 (severe): Secondary | ICD-10-CM

## 2016-06-22 DIAGNOSIS — E782 Mixed hyperlipidemia: Secondary | ICD-10-CM

## 2016-06-22 DIAGNOSIS — E1122 Type 2 diabetes mellitus with diabetic chronic kidney disease: Secondary | ICD-10-CM | POA: Diagnosis not present

## 2016-06-22 DIAGNOSIS — I2510 Atherosclerotic heart disease of native coronary artery without angina pectoris: Secondary | ICD-10-CM | POA: Diagnosis not present

## 2016-06-22 DIAGNOSIS — I10 Essential (primary) hypertension: Secondary | ICD-10-CM | POA: Diagnosis not present

## 2016-06-22 NOTE — Patient Instructions (Signed)

## 2016-06-22 NOTE — Progress Notes (Signed)
Subjective:    Patient ID: [name], male    DOB: [birth year], PCP [name], MD   Past Medical History:  Diagnosis Date  . Arthritis   . Bell palsy   . CAD (coronary artery disease)    a. 2014 MV: abnl w/ infap ischemia; b. 03/2013 Cath: aneurysmal bleb in the LAD w/ otw nonobs dzs-->Med Rx.  . Chronic back pain   . Chronic knee pain    a. 09/2015 s/p R TKA.  Marland Kitchen Chronic pain   . Chronic shoulder pain   . Chronic sinusitis   . CKD (chronic kidney disease), stage IV ([place])    stage 4 per office visit note of Dr Lowanda Foster on 05/2015   . COPD (chronic obstructive pulmonary disease) ([place])   . Diabetes mellitus without complication ([place])   . Essential hypertension   . GERD (gastroesophageal reflux disease)   . Hepatomegaly    noted on noncontrast CT 2015  . Hyperlipidemia   . Lateral meniscus tear   . Obesity    [place]  . Obstructive sleep apnea    cpap - settings at 4   . On home oxygen therapy    uses 2l when is going somewhere per patient   . Post traumatic stress disorder   . PUD (peptic ulcer disease)    remote, reports f/u EGD about 8 years ago unremarkable   . Reactive airway disease    related to exposure to chemical during 9/11  . Sinusitis   . Vitamin D deficiency    Past Surgical History:  Procedure Laterality Date  . ASAD LT SHOULDER  12/2008   left shoulder  . CATARACT EXTRACTION W/PHACO Left 03/28/2016   Procedure: CATARACT EXTRACTION PHACO AND INTRAOCULAR LENS PLACEMENT LEFT EYE;  Surgeon: [name], MD;  Location: [facility];  Service: Ophthalmology;  Laterality: Left;  CDE: 4.77  . CATARACT EXTRACTION W/PHACO Right 04/11/2016   Procedure: CATARACT EXTRACTION PHACO AND INTRAOCULAR LENS PLACEMENT RIGHT EYE; CDE:  4.74;  Surgeon: [name], MD;  Location: [facility];  Service: Ophthalmology;  Laterality: Right;  . COLONOSCOPY  10/2008   Fields: Rectal polyp obliterated, not retrieved, hemorrhoids, single ascending colon diverticulum near the CV. Next  colonoscopy April 2020  . COLONOSCOPY N/A 12/25/2014   SLF: 1. Colorectal polyps (2) removed 2. Small internal hemorrhoids 3. the left colon is severely redundant  . DOPPLER ECHOCARDIOGRAPHY    . ESOPHAGOGASTRODUODENOSCOPY N/A 12/25/2014   SLF: 1. Anemia most likely due to CRI, gastritis, gastric polyps 2. Moderate non-erosive gastriits and mild duodenitis.  3.TWo large gstric polyps removed.   Marland Kitchen EYE SURGERY  12/22/2010   tear duct probing-[place]  . FOREIGN BODY REMOVAL  03/29/2011   Procedure: REMOVAL FOREIGN BODY EXTREMITY;  Surgeon: Arther Abbott, MD;  Location: [facility];  Service: Orthopedics;  Laterality: Right;  Removal Foreign Body Right Thumb  . KNEE ARTHROSCOPY  10/2007   left  . KNEE ARTHROSCOPY WITH LATERAL MENISECTOMY Right 10/14/2015   Procedure: LEFT KNEE ARTHROSCOPY WITH PARTIAL LATERAL MENISECTOMY;  Surgeon: Carole Civil, MD;  Location: [facility];  Service: Orthopedics;  Laterality: Right;  . LEFT HEART CATHETERIZATION WITH CORONARY ANGIOGRAM N/A 03/28/2013   Procedure: LEFT HEART CATHETERIZATION WITH CORONARY ANGIOGRAM;  Surgeon: Leonie Man, MD;  Location: [facility] CATH LAB;  Service: Cardiovascular;  Laterality: N/A;  . NM MYOVIEW LTD    . PENILE PROSTHESIS IMPLANT N/A 08/16/2015   Procedure: PENILE PROTHESIS INFLATABLE, three piece, Excisional biopsy of Penile ulcer,  Penile molding;  Surgeon: Carolan Clines, MD;  Location: [facility];  Service: Urology;  Laterality: N/A;  . QUADRICEPS TENDON REPAIR  07/21/2011   Procedure: REPAIR QUADRICEP TENDON;  Surgeon: Arther Abbott, MD;  Location: [facility];  Service: Orthopedics;  Laterality: Right;  . TOENAIL EXCISION     removed [id number]  . UMBILICAL HERNIA REPAIR  2007   [place]   Social History   Social History  . Marital status: Married    Spouse name: N/A  . Number of children: 2  . Years of education: 12th grade   Occupational History  . disabled   .     Marland Kitchen  Unemployed   Social History Main Topics  . Smoking  status: Former Smoker    Packs/day: 1.00    Years: 25.00    Types: Cigarettes    Quit date: 03/27/2010  . Smokeless tobacco: Never Used     Comment: Quit x 7 years  . Alcohol use 0.0 oz/week     Comment: occasionally  . Drug use:     Types: Cocaine, Marijuana     Comment: stopped feb2011  . Sexual activity: Not Asked   Other Topics Concern  . None   Social History Narrative   He quit smoking in 2010. 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 He developed pulmonary problems, became disabled because of lower airway disease in 2009.    Outpatient Encounter Prescriptions as of 06/22/2016  Medication Sig  . albuterol (PROVENTIL HFA) 108 (90 BASE) MCG/ACT inhaler Inhale 2 puffs into the lungs every 4 (four) hours as needed for wheezing.   Marland Kitchen amLODipine (NORVASC) 10 MG tablet Take 10 mg by mouth daily.   Marland Kitchen aspirin EC 81 MG tablet Take 1 tablet (81 mg total) by mouth daily.  . budesonide-formoterol (SYMBICORT) 160-4.5 MCG/ACT inhaler Inhale 2 puffs into the lungs daily as needed (Shortness of Breath).   . cetirizine (ZYRTEC) 10 MG tablet Take 10 mg by mouth daily as needed for allergies.   . Cholecalciferol (VITAMIN D3) 5000 units CAPS Take 1 capsule (5,000 Units total) by mouth daily. (Patient taking differently: Take 2,000 Units by mouth daily. )  . hydrALAZINE (APRESOLINE) 10 MG tablet Take 1 tablet (10 mg total) by mouth 3 (three) times daily. Keep appointment. (Patient taking differently: Take 25 mg by mouth 3 (three) times daily. Keep appointment.)  . iron polysaccharides (NIFEREX) 150 MG capsule Take 150 mg by mouth daily.  Marland Kitchen LANTUS SOLOSTAR 100 UNIT/ML Solostar Pen INJECT  50 UNITS SUBCUTANEOUSLY AT BEDTIME  . mometasone (NASONEX) 50 MCG/ACT nasal spray Place 2 sprays into the nose daily as needed (allergies).   . pantoprazole (PROTONIX) 40 MG tablet 1 PO 30 MINS PRIOR TO MEALS BID  . simvastatin (ZOCOR) 20 MG tablet Take 20 mg by mouth every  morning.   . TRADJENTA 5 MG TABS tablet TAKE 1 TABLET EVERY DAY   No facility-administered encounter medications on file as of 06/22/2016.    ALLERGIES: Allergies  Allergen Reactions  . Opana [Oxymorphone Hcl] Itching  . Tramadol Itching   VACCINATION STATUS: Immunization History  Administered Date(s) Administered  . Td 03/27/2011    Diabetes  He presents for his follow-up diabetic visit. He has type 2 diabetes mellitus. Onset time: He was diagnosed approximately 20 years ago. His disease course has been stable. There are no hypoglycemic associated symptoms. Pertinent negatives for hypoglycemia include no confusion, headaches, pallor or seizures. Associated symptoms include  polyuria. Pertinent negatives for diabetes include no chest pain, no fatigue, no polydipsia, no polyphagia and no weakness. There are no hypoglycemic complications. Symptoms are stable. Diabetic complications include nephropathy. Risk factors for coronary artery disease include dyslipidemia, diabetes mellitus, hypertension, obesity, sedentary lifestyle, tobacco exposure and male sex. Current diabetic treatment includes insulin injections and oral agent (monotherapy). He is compliant with treatment most of the time. He is following a generally unhealthy diet. He has had a previous visit with a dietitian. He rarely participates in exercise. His breakfast blood glucose range is generally 140-180 mg/dl. Eye exam is current.  Hypertension  This is a chronic problem. The current episode started more than 1 year ago. The problem is controlled. Pertinent negatives include no chest pain, headaches, neck pain, palpitations or shortness of breath. Risk factors for coronary artery disease include diabetes mellitus, dyslipidemia, obesity, smoking/tobacco exposure and sedentary lifestyle. Past treatments include angiotensin blockers. Hypertensive end-organ damage includes kidney disease.  Hyperlipidemia  This is a chronic problem. The  current episode started more than 1 year ago. Pertinent negatives include no chest pain, myalgias or shortness of breath. Current antihyperlipidemic treatment includes statins. Risk factors for coronary artery disease include diabetes mellitus, dyslipidemia, hypertension, male sex and a sedentary lifestyle.     Review of Systems  Constitutional: Negative for fatigue and unexpected weight change.  HENT: Negative for dental problem, mouth sores and trouble swallowing.   Eyes: Negative for visual disturbance.  Respiratory: Negative for cough, choking, chest tightness, shortness of breath and wheezing.   Cardiovascular: Negative for chest pain, palpitations and leg swelling.  Gastrointestinal: Negative for abdominal distention, abdominal pain, constipation, diarrhea, nausea and vomiting.  Endocrine: Positive for polyuria. Negative for polydipsia and polyphagia.  Genitourinary: Negative for dysuria, flank pain, hematuria and urgency.  Musculoskeletal: Negative for back pain, gait problem, myalgias and neck pain.  Skin: Negative for pallor, rash and wound.  Neurological: Negative for seizures, syncope, weakness, numbness and headaches.  Psychiatric/Behavioral: Negative.  Negative for confusion and dysphoric mood.    Objective:    BP 134/74   Pulse 81   Ht 5\' 4"  (1.626 m)   Wt 250 lb (113.4 kg)   BMI 42.91 kg/m   Wt Readings from Last 3 Encounters:  06/22/16 250 lb (113.4 kg)  05/11/16 243 lb 3.2 oz (110.3 kg)  05/02/16 243 lb (110.2 kg)    Physical Exam  Constitutional: He is oriented to person, place, and time. He appears well-developed and well-nourished. He is cooperative. No distress.  HENT:  Head: Normocephalic and atraumatic.  Eyes: EOM are normal.  Neck: Normal range of motion. Neck supple. No tracheal deviation present. No thyromegaly present.  Cardiovascular: Normal rate, S1 normal, S2 normal and normal heart sounds.  Exam reveals no gallop.   No murmur heard. Pulses:       Dorsalis pedis pulses are 1+ on the right side, and 1+ on the left side.       Posterior tibial pulses are 1+ on the right side, and 1+ on the left side.  Pulmonary/Chest: Breath sounds normal. No respiratory distress. He has no wheezes.  Abdominal: Soft. Bowel sounds are normal. He exhibits no distension. There is no tenderness. There is no guarding and no CVA tenderness.  Musculoskeletal: He exhibits no edema.       Right shoulder: He exhibits no swelling and no deformity.  Neurological: He is alert and oriented to person, place, and time. He has normal strength and normal reflexes. No cranial  nerve deficit or sensory deficit. Gait normal.  Skin: Skin is warm and dry. No rash noted. No cyanosis. Nails show no clubbing.  Psychiatric: He has a normal mood and affect. His speech is normal and behavior is normal. Judgment and thought content normal. Cognition and memory are normal.    Results for orders placed or performed in visit on 06/16/16  COMPLETE METABOLIC PANEL WITH GFR  Result Value Ref Range   Sodium 142 135 - 146 mmol/L   Potassium 4.0 3.5 - 5.3 mmol/L   Chloride 107 98 - 110 mmol/L   CO2 24 20 - 31 mmol/L   Glucose, Bld 125 (H) 65 - 99 mg/dL   BUN 40 (H) 7 - 25 mg/dL   Creat 3.74 (H) 0.70 - 1.25 mg/dL   Total Bilirubin 0.4 0.2 - 1.2 mg/dL   Alkaline Phosphatase 45 40 - 115 U/L   AST 22 10 - 35 U/L   ALT 28 9 - 46 U/L   Total Protein 7.0 6.1 - 8.1 g/dL   Albumin 4.0 3.6 - 5.1 g/dL   Calcium 9.0 8.6 - 10.3 mg/dL   GFR, Est African American 18 (L) >=60 mL/min   GFR, Est Non African American 16 (L) >=60 mL/min  Lipid panel  Result Value Ref Range   Cholesterol 146 <200 mg/dL   Triglycerides 190 (H) <150 mg/dL   HDL 36 (L) >40 mg/dL   Total CHOL/HDL Ratio 4.1 <5.0 Ratio   VLDL 38 (H) <30 mg/dL   LDL Cholesterol 72 <100 mg/dL  Hemoglobin A1c  Result Value Ref Range   Hgb A1c MFr Bld 7.0 (H) <5.7 %   Mean Plasma Glucose 154 mg/dL   Diabetic Labs (most recent): Lab  Results  Component Value Date   HGBA1C 7.0 (H) 06/16/2016   HGBA1C 6.8 (H) 03/13/2016   HGBA1C 7.4 (H) 12/03/2015   Lipid Panel     Component Value Date/Time   CHOL 146 06/16/2016 0956   TRIG 190 (H) 06/16/2016 0956   HDL 36 (L) 06/16/2016 0956   CHOLHDL 4.1 06/16/2016 0956   VLDL 38 (H) 06/16/2016 0956   LDLCALC 72 06/16/2016 0956     Assessment & Plan:   1. Type 2 diabetes mellitus with stage 4 chronic kidney disease, with long-term current use of insulin (HCC)  -His  Diabetes is complicated by chronic kidney disease and patient remains at a high risk for more acute and chronic complications of diabetes which include CAD, CVA, CKD, retinopathy, and neuropathy. These are all discussed in detail with the patient.  Patient came with controlled fasting glucose profile, and  recent A1c of   7 % improving from 7.4% last visit.  Glucose logs and insulin administration records pertaining to this visit,  to be scanned into patient's records.  Recent labs reviewed.   - I have re-counseled the patient on diet management and weight loss  by adopting a carbohydrate restricted / protein rich  Diet.  - Suggestion is made for patient to avoid simple carbohydrates   from their diet including Cakes , Desserts, Ice Cream,  Soda (  diet and regular) , Sweet Tea , Candies,  Chips, Cookies, Artificial Sweeteners,   and "Sugar-free" Products .  This will help patient to have stable blood glucose profile and potentially avoid unintended  Weight gain.  - Patient is advised to stick to a routine mealtimes to eat 3 meals  a day and avoid unnecessary snacks ( to snack only to correct hypoglycemia).  I have also advised him to cut back on alcohol consumption.  - The patient  has been  scheduled with Jearld Fenton, RDN, CDE for individualized DM education.  - I have approached patient with the following individualized plan to manage diabetes and patient agrees.   -He is advised to stay away from ETOH and  smoking. He will continue to need at least basal insulin therapy.  -I will continue  Lantus  40 units  qhs, off of Humalog . Continue tradjenta 5mg  po qday.   He is following with Jearld Fenton, CDE for DM education.  He will be monitoring of BG BID.  -He will continue to f/u with Nephrology given stage 4 CKD ( improving),  even after his DM is controlled.  -Patient is not a candidate for metformin and SGLT2 inhibitors due to CKD. His creatinine increasing to 4, I advised him to stay in close follow-up with nephrology. Currently being considered for fistula placement.  - Patient specific target  for A1c; LDL, HDL, Triglycerides, and  Waist Circumference were discussed in detail.  2) BP/HTN: Controlled. Continue current medications including ACEI/ARB. 3) Lipids/HPL:  continue statins. 4)  Weight/Diet: CDE consult in progress, exercise, and carbohydrates information provided. 5) Vitamin D deficiency - He is status post therapy with vitamin D 50,000 weekly for 12 weeks, currently on vitamin D3 5,000 units daily. His insurance would not cover for repeat vitamin D test.   6) Chronic Care/Health Maintenance:  -Patient is on ACEI/ARB and Statin medications and encouraged to continue to follow up with Ophthalmology, Podiatrist at least yearly or according to recommendations, and advised to quit smoking. I have recommended yearly flu vaccine and pneumonia vaccination at least every 5 years; moderate intensity exercise for up to 150 minutes weekly; and  sleep for at least 7 hours a day.  - 25 minutes of time was spent on the care of this patient , 50% of which was applied for counseling on diabetes complications and their preventions.  - I advised patient to maintain close follow up with their PCP for primary care needs. - Patient is asked to bring meter and  blood glucose logs during their next visit.   Follow up plan: -Return in about 3 months (around 09/20/2016) for meter, and logs.  Glade Lloyd, MD Phone: [phone]  Fax: [phone]   06/22/2016, 9:54 AM

## 2016-06-23 ENCOUNTER — Other Ambulatory Visit (HOSPITAL_COMMUNITY): Payer: Self-pay | Admitting: Pulmonary Disease

## 2016-06-23 ENCOUNTER — Ambulatory Visit (HOSPITAL_COMMUNITY)
Admission: RE | Admit: 2016-06-23 | Discharge: 2016-06-23 | Disposition: A | Discharge status: Home / Self Care | Payer: Medicare Other | Source: Ambulatory Visit | Attending: Pulmonary Disease | Admitting: Pulmonary Disease

## 2016-06-23 ENCOUNTER — Other Ambulatory Visit (HOSPITAL_COMMUNITY): Payer: Self-pay | Admitting: Respiratory Therapy

## 2016-06-23 DIAGNOSIS — R0602 Shortness of breath: Secondary | ICD-10-CM

## 2016-06-23 DIAGNOSIS — R059 Cough, unspecified: Secondary | ICD-10-CM

## 2016-06-23 DIAGNOSIS — R918 Other nonspecific abnormal finding of lung field: Secondary | ICD-10-CM | POA: Diagnosis not present

## 2016-06-23 DIAGNOSIS — R05 Cough: Secondary | ICD-10-CM

## 2016-06-27 ENCOUNTER — Encounter (HOSPITAL_COMMUNITY): Payer: Self-pay | Admitting: Physical Therapy

## 2016-06-27 DIAGNOSIS — M79673 Pain in unspecified foot: Secondary | ICD-10-CM | POA: Diagnosis not present

## 2016-06-27 DIAGNOSIS — E1151 Type 2 diabetes mellitus with diabetic peripheral angiopathy without gangrene: Secondary | ICD-10-CM | POA: Diagnosis not present

## 2016-06-27 DIAGNOSIS — B351 Tinea unguium: Secondary | ICD-10-CM | POA: Diagnosis not present

## 2016-06-27 DIAGNOSIS — K760 Fatty (change of) liver, not elsewhere classified: Secondary | ICD-10-CM | POA: Diagnosis not present

## 2016-06-28 LAB — HEPATIC FUNCTION PANEL
ALBUMIN: 4 g/dL (ref 3.6–5.1)
ALT: 19 U/L (ref 9–46)
AST: 18 U/L (ref 10–35)
Alkaline Phosphatase: 48 U/L (ref 40–115)
Bilirubin, Direct: 0.1 mg/dL (ref <=0.2)
Indirect Bilirubin: 0.4 mg/dL (ref 0.2–1.2)
Total Bilirubin: 0.5 mg/dL (ref 0.2–1.2)
Total Protein: 6.8 g/dL (ref 6.1–8.1)

## 2016-06-29 ENCOUNTER — Encounter (HOSPITAL_COMMUNITY): Payer: Self-pay | Admitting: Physical Therapy

## 2016-06-29 ENCOUNTER — Encounter: Payer: Self-pay | Admitting: Vascular Surgery

## 2016-07-02 NOTE — Progress Notes (Signed)
lfts normal. Keep ov as planned as per slf.

## 2016-07-03 DIAGNOSIS — Z961 Presence of intraocular lens: Secondary | ICD-10-CM | POA: Diagnosis not present

## 2016-07-03 DIAGNOSIS — Z9842 Cataract extraction status, left eye: Secondary | ICD-10-CM | POA: Diagnosis not present

## 2016-07-03 DIAGNOSIS — H04223 Epiphora due to insufficient drainage, bilateral lacrimal glands: Secondary | ICD-10-CM | POA: Diagnosis not present

## 2016-07-03 DIAGNOSIS — Z9841 Cataract extraction status, right eye: Secondary | ICD-10-CM | POA: Diagnosis not present

## 2016-07-03 DIAGNOSIS — Z87891 Personal history of nicotine dependence: Secondary | ICD-10-CM | POA: Diagnosis not present

## 2016-07-03 DIAGNOSIS — H0233 Blepharochalasis right eye, unspecified eyelid: Secondary | ICD-10-CM | POA: Diagnosis not present

## 2016-07-03 DIAGNOSIS — G513 Clonic hemifacial spasm: Secondary | ICD-10-CM | POA: Diagnosis not present

## 2016-07-03 DIAGNOSIS — H0236 Blepharochalasis left eye, unspecified eyelid: Secondary | ICD-10-CM | POA: Diagnosis not present

## 2016-07-03 DIAGNOSIS — Z885 Allergy status to narcotic agent status: Secondary | ICD-10-CM | POA: Diagnosis not present

## 2016-07-03 NOTE — Progress Notes (Signed)
Phone not working. Mailed letter that lab results normal and keep appt.

## 2016-07-05 ENCOUNTER — Encounter: Payer: Self-pay | Admitting: Vascular Surgery

## 2016-07-05 ENCOUNTER — Other Ambulatory Visit: Payer: Self-pay

## 2016-07-05 ENCOUNTER — Ambulatory Visit (INDEPENDENT_AMBULATORY_CARE_PROVIDER_SITE_OTHER): Payer: Medicare Other | Admitting: Vascular Surgery

## 2016-07-05 VITALS — BP 146/96 | HR 80 | Temp 98.8°F | Resp 18 | Ht 64.0 in | Wt 250.2 lb

## 2016-07-05 DIAGNOSIS — I2510 Atherosclerotic heart disease of native coronary artery without angina pectoris: Secondary | ICD-10-CM | POA: Diagnosis not present

## 2016-07-05 DIAGNOSIS — N184 Chronic kidney disease, stage 4 (severe): Secondary | ICD-10-CM | POA: Diagnosis not present

## 2016-07-05 NOTE — Progress Notes (Signed)
Referring Physician: Dr Hinda Lenis   Patient name: [name] [name]         MRN: [id number]        DOB: 07/06/1951        Sex: male   REASON FOR CONSULT: hemodialysis access   HPI: [name] [name] is a 65 y.o. male, referred for placement of a permanent hemodialysis access. The patient's recent serum creatinine was 3.7. He is CK D4. He is right handed. He denies any history of pacemaker AICD. He has no prior access procedures. Not currently on hemodialysis. Other medical problems include coronary artery disease, chronic back pain, COPD, hypertension, upper lipidemia, sleep apnea all of which have been stable.       Past Medical History:  Diagnosis Date  . Arthritis    . Bell palsy    . CAD (coronary artery disease)      a. 2014 MV: abnl w/ infap ischemia; b. 03/2013 Cath: aneurysmal bleb in the LAD w/ otw nonobs dzs-->Med Rx.  . Chronic back pain    . Chronic knee pain      a. 09/2015 s/p R TKA.  Marland Kitchen Chronic pain    . Chronic shoulder pain    . Chronic sinusitis    . CKD (chronic kidney disease), stage IV ([place])      stage 4 per office visit note of Dr Lowanda Foster on 05/2015   . COPD (chronic obstructive pulmonary disease) ([place])    . Diabetes mellitus without complication ([place])    . Essential hypertension    . GERD (gastroesophageal reflux disease)    . Hepatomegaly      noted on noncontrast CT 2015  . Hyperlipidemia    . Lateral meniscus tear    . Obesity      Truncal  . Obstructive sleep apnea      cpap - settings at 4   . On home oxygen therapy      uses 2l when is going somewhere per patient   . Post traumatic stress disorder    . PUD (peptic ulcer disease)      remote, reports f/u EGD about 8 years ago unremarkable   . Reactive airway disease      related to exposure to chemical during 9/11  . Sinusitis    . Vitamin D deficiency           Past Surgical History:  Procedure Laterality Date  . ASAD LT SHOULDER   12/2008    left shoulder  . CATARACT EXTRACTION W/PHACO  Left 03/28/2016    Procedure: CATARACT EXTRACTION PHACO AND INTRAOCULAR LENS PLACEMENT LEFT EYE;  Surgeon: [name], MD;  Location: [facility];  Service: Ophthalmology;  Laterality: Left;  CDE: 4.77  . CATARACT EXTRACTION W/PHACO Right 04/11/2016    Procedure: CATARACT EXTRACTION PHACO AND INTRAOCULAR LENS PLACEMENT RIGHT EYE; CDE:  4.74;  Surgeon: [name], MD;  Location: [facility];  Service: Ophthalmology;  Laterality: Right;  . COLONOSCOPY   10/2008    [name] [name]: Rectal polyp obliterated, not retrieved, hemorrhoids, single ascending colon diverticulum near the CV. Next colonoscopy April 2020  . COLONOSCOPY N/A 12/25/2014    SLF: 1. Colorectal polyps (2) removed 2. Small internal hemorrhoids 3. the left colon is severely redundant  . DOPPLER ECHOCARDIOGRAPHY      . ESOPHAGOGASTRODUODENOSCOPY N/A 12/25/2014    SLF: 1. Anemia most likely due to CRI, gastritis, gastric polyps 2. Moderate non-erosive gastriits and mild duodenitis.  3.TWo large gstric  polyps removed.   Marland Kitchen EYE SURGERY   12/22/2010    tear duct probing-[place]  . FOREIGN BODY REMOVAL   03/29/2011    Procedure: REMOVAL FOREIGN BODY EXTREMITY;  Surgeon: [name] Abbott, MD;  Location: [facility];  Service: Orthopedics;  Laterality: Right;  Removal Foreign Body Right Thumb  . KNEE ARTHROSCOPY   10/2007    left  . KNEE ARTHROSCOPY WITH LATERAL MENISECTOMY Right 10/14/2015    Procedure: LEFT KNEE ARTHROSCOPY WITH PARTIAL LATERAL MENISECTOMY;  Surgeon: Carole Civil, MD;  Location: [facility];  Service: Orthopedics;  Laterality: Right;  . LEFT HEART CATHETERIZATION WITH CORONARY ANGIOGRAM N/A 03/28/2013    Procedure: LEFT HEART CATHETERIZATION WITH CORONARY ANGIOGRAM;  Surgeon: [name] Man, MD;  Location: [facility] CATH LAB;  Service: Cardiovascular;  Laterality: N/A;  . NM MYOVIEW LTD      . PENILE PROSTHESIS IMPLANT N/A 08/16/2015    Procedure: PENILE PROTHESIS INFLATABLE, three piece, Excisional biopsy of Penile ulcer, Penile molding;  Surgeon: Carolan Clines, MD;  Location: [facility];  Service: Urology;  Laterality: N/A;  . QUADRICEPS TENDON REPAIR   07/21/2011    Procedure: REPAIR QUADRICEP TENDON;  Surgeon: [name] Abbott, MD;  Location: [facility];  Service: Orthopedics;  Laterality: Right;  . TOENAIL EXCISION        removed [id number]  . UMBILICAL HERNIA REPAIR   2007    [place]            Family History  Problem Relation Age of Onset  . Hypertension Mother        MI  . Cancer Mother        breast   . Diabetes Mother    . Diabetes Father    . Hypertension Father    . Hypertension Sister    . Diabetes Sister    . Arthritis      . Asthma      . Lung disease      . Anesthesia problems Neg Hx    . Hypotension Neg Hx    . Malignant hyperthermia Neg Hx    . Pseudochol deficiency Neg Hx    . Colon cancer Neg Hx        SOCIAL HISTORY: Social History         Social History  . Marital status: Married      Spouse name: N/A  . Number of children: 2  . Years of education: 12th grade        Occupational History  . disabled    .      Marland Kitchen   Unemployed          Social History Main Topics  . Smoking status: Former Smoker      Packs/day: 1.00      Years: 25.00      Types: Cigarettes      Quit date: 03/27/2010  . Smokeless tobacco: Never Used        Comment: Quit x 7 years  . Alcohol use 0.0 oz/week        Comment: occasionally  . Drug use:        Types: Cocaine, Marijuana        Comment: stopped feb2011  . Sexual activity: Not on file        Other Topics Concern  . Not on file       Social History Narrative    He quit smoking in 2010. 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 He developed pulmonary problems, became disabled because of lower airway disease in 2009.           Allergies  Allergen Reactions  . Opana [Oxymorphone Hcl] Itching  . Tramadol Itching      Current Outpatient Prescriptions  Medication Sig Dispense Refill  . albuterol (PROVENTIL HFA) 108 (90  BASE) MCG/ACT inhaler Inhale 2 puffs into the lungs every 4 (four) hours as needed for wheezing.       Marland Kitchen amLODipine (NORVASC) 10 MG tablet Take 10 mg by mouth daily.       Marland Kitchen aspirin EC 81 MG tablet Take 1 tablet (81 mg total) by mouth daily. 90 tablet 3  . budesonide-formoterol (SYMBICORT) 160-4.5 MCG/ACT inhaler Inhale 2 puffs into the lungs daily as needed (Shortness of Breath).       . cetirizine (ZYRTEC) 10 MG tablet Take 10 mg by mouth daily as needed for allergies.       . Cholecalciferol (VITAMIN D3) 5000 units CAPS Take 1 capsule (5,000 Units total) by mouth daily. (Patient taking differently: Take 2,000 Units by mouth daily. ) 90 capsule 0  . hydrALAZINE (APRESOLINE) 10 MG tablet Take 1 tablet (10 mg total) by mouth 3 (three) times daily. Keep appointment. (Patient taking differently: Take 25 mg by mouth 3 (three) times daily. Keep appointment.) 270 tablet 0  . Insulin Glargine (LANTUS SOLOSTAR) 100 UNIT/ML Solostar Pen Inject 40 Units into the skin daily at 10 pm. 15 pen 0  . iron polysaccharides (NIFEREX) 150 MG capsule Take 150 mg by mouth daily.      . mometasone (NASONEX) 50 MCG/ACT nasal spray Place 2 sprays into the nose daily as needed (allergies).       . pantoprazole (PROTONIX) 40 MG tablet 1 PO 30 MINS PRIOR TO MEALS BID 180 tablet 3  . simvastatin (ZOCOR) 20 MG tablet Take 20 mg by mouth every morning.       . TRADJENTA 5 MG TABS tablet TAKE 1 TABLET EVERY DAY 90 tablet 0    No current facility-administered medications for this visit.       ROS:    General:  No weight loss, Fever, chills   HEENT: No recent headaches, no nasal bleeding, no visual changes, no sore throat   Neurologic: No dizziness, blackouts, seizures. No recent symptoms of stroke or mini- stroke. No recent episodes of slurred speech, or temporary blindness.   Cardiac: No recent episodes of chest pain/pressure, no shortness of breath at rest.  No shortness of breath with exertion.  Denies history of atrial  fibrillation or irregular heartbeat   Vascular: No history of rest pain in feet.  No history of claudication.  No history of non-healing ulcer, No history of DVT    Pulmonary: No home oxygen, no productive cough, no hemoptysis,  No asthma or wheezing   Musculoskeletal:  [ ]  Arthritis, [X]  Low back pain,  [X]  Joint pain   Hematologic:No history of hypercoagulable state.  No history of easy bleeding.  No history of anemia   Gastrointestinal: No hematochezia or melena,  No gastroesophageal reflux, no trouble swallowing   Urinary: [X]  chronic Kidney disease, [ ]  on HD - [ ]  MWF or [ ]  TTHS, [ ]  Burning with urination, [ ]  Frequent urination, [ ]  Difficulty urinating;    Skin: No rashes   Psychological: No history of anxiety,  No history of depression     Physical Examination       Vitals:  05/11/16 1106 05/11/16 1108  BP: (!) 145/92 (!) 141/89  Pulse: 74    Resp: 20    Temp: 97.7 F (36.5 C)    TempSrc: Oral    SpO2: 96%    Weight: 243 lb 3.2 oz (110.3 kg)    Height: 5\' 4"  (1.626 m)        Body mass index is 41.75 kg/m.   General:  Alert and oriented, no acute distress HEENT: Normal Neck: No JVD Extremity Pulses:  2+ radial, brachial  pulses bilaterally Musculoskeletal: No deformity or edema      Neurologic: Upper and lower extremity motor 5/5 and symmetric   DATA: Pt had bilateral upper extremity vein mapping and bilateral upper extremity arterial duplex exam in October 2017. Radial artery was 2 mm at the wrist bilaterally. Brachial artery was 5-6 m. Normal location.   Right cephalic vein was 3-6 mm from the distal forearm to the upper arm. Right basilic vein was 3-5 mm. Left cephalic vein was less than 2 mm at the wrist 3-5 mm in the upper arm     ASSESSMENT:  Patient needs long-term hemodialysis access. Anatomy is best suited for a left brachiocephalic AV fistula.     PLAN:  Risks benefits possible complications and procedure details including but not limited to  non-maturation of the fistula, bleeding, infection, ischemic steal were explained to the patient today as well as his wife. They understand and agree to proceed. This is scheduled for 07/26/2016.     Ruta Hinds, MD [facility] Office: [phone] Pager: [phone]

## 2016-07-06 ENCOUNTER — Ambulatory Visit (INDEPENDENT_AMBULATORY_CARE_PROVIDER_SITE_OTHER): Payer: Medicare Other | Admitting: Gastroenterology

## 2016-07-06 ENCOUNTER — Encounter: Payer: Self-pay | Admitting: Gastroenterology

## 2016-07-06 DIAGNOSIS — K760 Fatty (change of) liver, not elsewhere classified: Secondary | ICD-10-CM | POA: Diagnosis not present

## 2016-07-06 DIAGNOSIS — K640 First degree hemorrhoids: Secondary | ICD-10-CM

## 2016-07-06 DIAGNOSIS — I2510 Atherosclerotic heart disease of native coronary artery without angina pectoris: Secondary | ICD-10-CM

## 2016-07-06 DIAGNOSIS — K219 Gastro-esophageal reflux disease without esophagitis: Secondary | ICD-10-CM

## 2016-07-06 NOTE — Patient Instructions (Signed)
DRINK WATER TO KEEP YOUR URINE LIGHT YELLOW.  CONTINUE YOUR WEIGHT LOSS EFFORTS. EAT SMALLER PORTIONS.   AVOID REFLUX TRIGGERS. SEE INFO BELOW.  CONTINUE PROTONIX. TAKE 30 MINUTES PRIOR TO MEALS TWICE DAILY.  FOLLOW UP IN 6 MOS. MERRY CHRISTMAS AND HAPPY NEW YEAR!  Lifestyle and home remedies TO MANAGE REFLUX/CHEST PAIN  You may eliminate or reduce the frequency of heartburn by making the following lifestyle changes:  . Control your weight. Being overweight is a major risk factor for heartburn and GERD. Excess pounds put pressure on your abdomen, pushing up your stomach and causing acid to back up into your esophagus.   . Eat smaller meals. 4 TO 6 MEALS A DAY. This reduces pressure on the lower esophageal sphincter, helping to prevent the valve from opening and acid from washing back into your esophagus.   Dolphus Jenny your belt. Clothes that fit tightly around your waist put pressure on your abdomen and the lower esophageal sphincter.   . Eliminate heartburn triggers. Everyone has specific triggers. Common triggers such as fatty or fried foods, spicy food, tomato sauce, carbonated beverages, alcohol, chocolate, mint, garlic, onion, caffeine and nicotine may make heartburn worse.   Marland Kitchen Avoid stooping or bending. Tying your shoes is OK. Bending over for longer periods to weed your garden isn't, especially soon after eating.   . Don't lie down after a meal. Wait at least three to four hours after eating before going to bed, and don't lie down right after eating.   Marland Kitchen PUT THE HEAD OF YOUR BED ON 6 INCH BLOCKS.   Alternative medicine . Several home remedies exist for treating GERD, but they provide only temporary relief. They include drinking baking soda (sodium bicarbonate) added to water or drinking other fluids such as baking soda mixed with cream of tartar and water.  . Although these liquids create temporary relief by neutralizing, washing away or buffering acids, eventually they  aggravate the situation by adding gas and fluid to your stomach, increasing pressure and causing more acid reflux. Further, adding more sodium to your diet may increase your blood pressure and add stress to your heart, and excessive bicarbonate ingestion can alter the acid-base balance in your body.

## 2016-07-06 NOTE — Assessment & Plan Note (Signed)
WEIGHT UP 6 LBS.  DRINK WATER TO KEEP YOUR URINE LIGHT YELLOW. CONTINUE YOUR WEIGHT LOSS EFFORTS. EAT SMALLER PORTIONS. FOLLOW UP IN 6 MOS.

## 2016-07-06 NOTE — Progress Notes (Signed)
Subjective:    Patient ID: [name] [name], male    DOB: March 13, 1951, 65 y.o.   MRN: [id number] [name], MD  HPI REFLUX BOTHERS HIM EVERY DAY. NOT REALLY AT NIGHT. BREAKFAST: EGGS, FAT BACK. COFFEE, 2 SLICES WHEAT BREAD. DINNER YESTERDAY: [employer] AND [employer]. LUNCH YESTERDAY: NONE. BREAKFAST YESTERDAY: SAUSAGE EGGS, AND TEA. BMS: GOOD. ASA DAILY. PROTONIX TWICE DAILY.   PT DENIES FEVER, CHILLS, HEMATOCHEZIA, HEMATEMESIS, nausea, vomiting, melena, diarrhea, CHEST PAIN, SHORTNESS OF BREATH,  CHANGE IN BOWEL IN HABITS, constipation, abdominal pain, OR problems swallowing.  Past Medical History:  Diagnosis Date  . Arthritis   . Bell palsy   . CAD (coronary artery disease)    a. 2014 MV: abnl w/ infap ischemia; b. 03/2013 Cath: aneurysmal bleb in the LAD w/ otw nonobs dzs-->Med Rx.  . Chronic back pain   . Chronic knee pain    a. 09/2015 s/p R TKA.  Marland Kitchen Chronic pain   . Chronic shoulder pain   . Chronic sinusitis   . CKD (chronic kidney disease), stage IV ([place])    stage 4 per office visit note of Dr Lowanda Foster on 05/2015   . COPD (chronic obstructive pulmonary disease) ([place])   . Diabetes mellitus without complication ([place])   . Essential hypertension   . GERD (gastroesophageal reflux disease)   . Hepatomegaly    noted on noncontrast CT 2015  . Hyperlipidemia   . Lateral meniscus tear   . Obesity    Truncal  . Obstructive sleep apnea    cpap - settings at 4   . On home oxygen therapy    uses 2l when is going somewhere per patient   . Post traumatic stress disorder   . PUD (peptic ulcer disease)    remote, reports f/u EGD about 8 years ago unremarkable   . Reactive airway disease    related to exposure to chemical during 9/11  . Sinusitis   . Vitamin D deficiency    Past Surgical History:  Procedure Laterality Date  . ASAD LT SHOULDER  12/2008   left shoulder  . CATARACT EXTRACTION W/PHACO Left 03/28/2016   Procedure: CATARACT EXTRACTION PHACO AND INTRAOCULAR LENS  PLACEMENT LEFT EYE;  Surgeon: [name], MD;  [facility]: [facility];  Service: Ophthalmology;  Laterality: Left;  CDE: 4.77  . CATARACT EXTRACTION W/PHACO Right 04/11/2016   Procedure: CATARACT EXTRACTION PHACO AND INTRAOCULAR LENS PLACEMENT RIGHT EYE; CDE:  4.74;  Surgeon: [name], MD;  Location: [facility];  Service: Ophthalmology;  Laterality: Right;  . COLONOSCOPY  10/2008   [place]: Rectal polyp obliterated, not retrieved, hemorrhoids, single ascending colon diverticulum near the CV. Next colonoscopy April 2020  . COLONOSCOPY N/A 12/25/2014   SLF: 1. Colorectal polyps (2) removed 2. Small internal hemorrhoids 3. the left colon is severely redundant  . DOPPLER ECHOCARDIOGRAPHY    . ESOPHAGOGASTRODUODENOSCOPY N/A 12/25/2014   SLF: 1. Anemia most likely due to CRI, gastritis, gastric polyps 2. Moderate non-erosive gastriits and mild duodenitis.  3.TWo large gstric polyps removed.   Marland Kitchen EYE SURGERY  12/22/2010   tear duct probing-[place]  . FOREIGN BODY REMOVAL  03/29/2011   Procedure: REMOVAL FOREIGN BODY EXTREMITY;  Surgeon: Arther Abbott, MD;  Location: [facility];  Service: Orthopedics;  Laterality: Right;  Removal Foreign Body Right Thumb  . KNEE ARTHROSCOPY  10/2007   left  . KNEE ARTHROSCOPY WITH LATERAL MENISECTOMY Right 10/14/2015   Procedure: LEFT KNEE ARTHROSCOPY WITH PARTIAL LATERAL MENISECTOMY;  Surgeon: Tim Lair  Aline Brochure, MD;  Location: [facility];  Service: Orthopedics;  Laterality: Right;  . LEFT HEART CATHETERIZATION WITH CORONARY ANGIOGRAM N/A 03/28/2013   Procedure: LEFT HEART CATHETERIZATION WITH CORONARY ANGIOGRAM;  Surgeon: Leonie Man, MD;  Location[facility] CATH LAB;  Service: Cardiovascular;  Laterality: N/A;  . NM MYOVIEW LTD    . PENILE PROSTHESIS IMPLANT N/A 08/16/2015   Procedure: PENILE PROTHESIS INFLATABLE, three piece, Excisional biopsy of Penile ulcer, Penile molding;  Surgeon: Carolan Clines, MD;  Location: [facility];  Service: Urology;  Laterality: N/A;  . QUADRICEPS TENDON  REPAIR  07/21/2011   Procedure: REPAIR QUADRICEP TENDON;  Surgeon: Arther Abbott, MD;  Location: [facility];  Service: Orthopedics;  Laterality: Right;  . TOENAIL EXCISION     removed [id number]  . UMBILICAL HERNIA REPAIR  2007   [place]   Allergies  Allergen Reactions  . Opana [Oxymorphone Hcl] Itching  . Tramadol Itching   Current Outpatient Prescriptions  Medication Sig Dispense Refill  . albuterol (PROVENTIL HFA) 108 (90 BASE) MCG/ACT inhaler Inhale 2 puffs into the lungs every 4 (four) hours as needed for wheezing.     Marland Kitchen amLODipine (NORVASC) 10 MG tablet Take 10 mg by mouth daily.     Marland Kitchen aspirin EC 81 MG tablet Take 1 tablet (81 mg total) by mouth daily.    . budesonide-formoterol (SYMBICORT) 160-4.5 MCG/ACT inhaler Inhale 2 puffs into the lungs daily as needed (Shortness of Breath).     . cetirizine (ZYRTEC) 10 MG tablet Take 10 mg by mouth daily as needed for allergies.     . Cholecalciferol (VITAMIN D3) 5000 units CAPS Take 1 capsule (5,000 Units total) by mouth daily. (Patient taking differently: Take 2,000 Units by mouth daily. )    . hydrALAZINE (APRESOLINE) 10 MG tablet Take 1 tablet (10 mg total) by mouth 3 (three) times daily. Keep appointment. (Patient taking differently: Take 25 mg by mouth 3 (three) times daily. Keep appointment.)    . iron polysaccharides (NIFEREX) 150 MG capsule Take 150 mg by mouth daily.    Marland Kitchen LANTUS SOLOSTAR 100 UNIT/ML Solostar Pen INJECT  50 UNITS SUBCUTANEOUSLY AT BEDTIME (Patient taking differently: INJECT  50 UNITS SUBCUTANEOUSLY AT BEDTIME. Taking 40 units at bedtime)    . mometasone (NASONEX) 50 MCG/ACT nasal spray Place 2 sprays into the nose daily as needed (allergies).     . pantoprazole (PROTONIX) 40 MG tablet 1 PO 30 MINS PRIOR TO MEALS BID    . simvastatin (ZOCOR) 20 MG tablet Take 20 mg by mouth every morning.     . TRADJENTA 5 MG TABS tablet TAKE 1 TABLET EVERY DAY     Review of Systems PER HPI OTHERWISE ALL SYSTEMS ARE NEGATIVE.      Objective:   Physical Exam  Constitutional: He is oriented to person, place, and time. He appears well-developed and well-nourished. No distress.  HENT:  Head: Normocephalic and atraumatic.  Mouth/Throat: Oropharynx is clear and moist. No oropharyngeal exudate.  Eyes: Pupils are equal, round, and reactive to light. No scleral icterus.  Neck: Normal range of motion. Neck supple.  Cardiovascular: Normal rate, regular rhythm and normal heart sounds.   Pulmonary/Chest: Effort normal and breath sounds normal. No respiratory distress.  Abdominal: Soft. Bowel sounds are normal. He exhibits no distension. There is no tenderness.  Musculoskeletal: He exhibits edema (1+ BIL LOWER EXTREMITIES).  Lymphadenopathy:    He has no cervical adenopathy.  Neurological: He is alert and oriented to person, place, and time.  NO  FOCAL DEFICITS  Psychiatric: He has a normal mood and affect.  Vitals reviewed.     Assessment & Plan:

## 2016-07-06 NOTE — Progress Notes (Signed)
cc'ed to pcp °

## 2016-07-06 NOTE — Assessment & Plan Note (Signed)
SYMPTOMS CONTROLLED/RESOLVED.  CONTINUE TO MONITOR SYMPTOMS. 

## 2016-07-06 NOTE — Assessment & Plan Note (Signed)
SYMPTOMS NOT CONTROLLED DUE TO LIFESTYLE CHOICES.   DRINK WATER TO KEEP YOUR URINE LIGHT YELLOW. CONTINUE YOUR WEIGHT LOSS EFFORTS. EAT SMALLER PORTIONS. AVOID REFLUX TRIGGERS.  HANDOUT GIVEN. CONTINUE PROTONIX. TAKE 30 MINUTES PRIOR TO MEALS TWICE DAILY. FOLLOW UP IN 6 MOS.

## 2016-07-06 NOTE — Assessment & Plan Note (Signed)
WEIGHT UP 6 LBS SINCE MAY 2017.  DRINK WATER TO KEEP YOUR URINE LIGHT YELLOW. CONTINUE YOUR WEIGHT LOSS EFFORTS. EAT SMALLER PORTIONS. TAKE 30 MINUTES PRIOR TO MEALS TWICE DAILY.  FOLLOW UP IN 6 MOS.

## 2016-07-18 ENCOUNTER — Other Ambulatory Visit: Payer: Self-pay | Admitting: "Endocrinology

## 2016-07-19 ENCOUNTER — Encounter (HOSPITAL_COMMUNITY): Payer: Self-pay

## 2016-07-20 DIAGNOSIS — H04223 Epiphora due to insufficient drainage, bilateral lacrimal glands: Secondary | ICD-10-CM | POA: Diagnosis not present

## 2016-07-20 DIAGNOSIS — H04552 Acquired stenosis of left nasolacrimal duct: Secondary | ICD-10-CM | POA: Diagnosis not present

## 2016-07-20 DIAGNOSIS — M25572 Pain in left ankle and joints of left foot: Secondary | ICD-10-CM | POA: Diagnosis not present

## 2016-07-20 DIAGNOSIS — M79673 Pain in unspecified foot: Secondary | ICD-10-CM | POA: Diagnosis not present

## 2016-07-21 ENCOUNTER — Encounter (HOSPITAL_COMMUNITY): Payer: Self-pay | Admitting: Emergency Medicine

## 2016-07-21 ENCOUNTER — Encounter (HOSPITAL_COMMUNITY): Payer: Self-pay | Admitting: *Deleted

## 2016-07-21 ENCOUNTER — Encounter (HOSPITAL_COMMUNITY): Payer: Self-pay

## 2016-07-21 NOTE — Progress Notes (Signed)
Spoke with pt for pre-op call. Pt has hx of CAD and is followed by Dr. Debara Pickett. Last office visit with Dr. Debara Pickett was in October, 2017. Pt denies any recent chest pain. Does state he has sob, uses O2 as needed at home. Pt is diabetic. Last A1C was 7.0 on 06/16/16. Pt states his fasting blood sugar is usually between 80-120. Today it was 187 but he states he did a breathing treatment prior to checking his blood sugar. Pt states he was treated for pneumonia in December and still has bronchitis. Instructed pt to call Dr. Oneida Alar' office if he thinks the bronchitis is getting worse. He states it's much better.  Instructed pt to take 1/2 of his regular dose of Lantus Insulin Sunday PM. No oral diabetic meds morning of surgery. Instructed pt to check his blood sugar Monday AM. If blood sugar is 70 or below, treat with 1/2 cup of clear juice (apple or cranberry) and recheck blood sugar 15 minutes after drinking juice.

## 2016-07-24 ENCOUNTER — Ambulatory Visit (HOSPITAL_COMMUNITY): Admission: RE | Admit: 2016-07-24 | Payer: Medicare Other | Source: Ambulatory Visit | Admitting: Vascular Surgery

## 2016-07-24 ENCOUNTER — Other Ambulatory Visit: Payer: Self-pay

## 2016-07-24 HISTORY — DX: Pneumonia, unspecified organism: J18.9

## 2016-07-24 HISTORY — DX: Personal history of other diseases of the digestive system: Z87.19

## 2016-07-24 HISTORY — DX: Dyspnea, unspecified: R06.00

## 2016-07-24 HISTORY — DX: Anemia, unspecified: D64.9

## 2016-07-24 SURGERY — ARTERIOVENOUS (AV) FISTULA CREATION
Anesthesia: Monitor Anesthesia Care | Laterality: Left

## 2016-08-08 ENCOUNTER — Encounter (HOSPITAL_COMMUNITY): Payer: Self-pay | Admitting: *Deleted

## 2016-08-08 ENCOUNTER — Other Ambulatory Visit: Payer: Self-pay

## 2016-08-08 NOTE — Progress Notes (Signed)
Spoke with pt for pre-op call. Just talked with pt on 07/21/16 for a pre-op call and his surgery was cancelled due to pt having bronchitis/"touch of pneumonia". Pt verified allergies, medications, medical and surgical history has not changed. States blood sugar has been good - between 80-120. Instructed pt to take 1/2 of his regular dose of Lantus tonight (will take 20 units) and not to take his Tradjenta in the AM. Instructed pt to check blood sugar when he gets up in the AM and every 2 hours prior to leaving for the hospital. If blood sugar is 70 or below, treat with 1/2 cup of clear juice (apple or cranberry) and recheck blood sugar 15 minutes after drinking juice. If blood sugar continues to be 70 or below, call the Short Stay department and ask to speak to a nurse. He voiced understanding.

## 2016-08-09 ENCOUNTER — Encounter (HOSPITAL_COMMUNITY): Payer: Self-pay | Admitting: *Deleted

## 2016-08-09 ENCOUNTER — Ambulatory Visit (HOSPITAL_COMMUNITY)
Admission: RE | Admit: 2016-08-09 | Discharge: 2016-08-09 | Disposition: A | Discharge status: Home / Self Care | Payer: Medicare Other | Source: Ambulatory Visit | Attending: Vascular Surgery | Admitting: Vascular Surgery

## 2016-08-09 ENCOUNTER — Encounter (HOSPITAL_COMMUNITY)
Admission: RE | Disposition: A | Discharge status: Home / Self Care | Payer: Self-pay | Source: Ambulatory Visit | Attending: Vascular Surgery

## 2016-08-09 ENCOUNTER — Ambulatory Visit (HOSPITAL_COMMUNITY): Payer: Medicare Other | Admitting: Anesthesiology

## 2016-08-09 ENCOUNTER — Other Ambulatory Visit: Payer: Self-pay | Admitting: *Deleted

## 2016-08-09 DIAGNOSIS — N186 End stage renal disease: Secondary | ICD-10-CM | POA: Insufficient documentation

## 2016-08-09 DIAGNOSIS — E1122 Type 2 diabetes mellitus with diabetic chronic kidney disease: Secondary | ICD-10-CM | POA: Insufficient documentation

## 2016-08-09 DIAGNOSIS — J449 Chronic obstructive pulmonary disease, unspecified: Secondary | ICD-10-CM | POA: Diagnosis not present

## 2016-08-09 DIAGNOSIS — G4730 Sleep apnea, unspecified: Secondary | ICD-10-CM | POA: Insufficient documentation

## 2016-08-09 DIAGNOSIS — E559 Vitamin D deficiency, unspecified: Secondary | ICD-10-CM | POA: Diagnosis not present

## 2016-08-09 DIAGNOSIS — Z7982 Long term (current) use of aspirin: Secondary | ICD-10-CM | POA: Insufficient documentation

## 2016-08-09 DIAGNOSIS — Z87891 Personal history of nicotine dependence: Secondary | ICD-10-CM | POA: Insufficient documentation

## 2016-08-09 DIAGNOSIS — I120 Hypertensive chronic kidney disease with stage 5 chronic kidney disease or end stage renal disease: Secondary | ICD-10-CM | POA: Diagnosis not present

## 2016-08-09 DIAGNOSIS — G4733 Obstructive sleep apnea (adult) (pediatric): Secondary | ICD-10-CM | POA: Diagnosis not present

## 2016-08-09 DIAGNOSIS — I2510 Atherosclerotic heart disease of native coronary artery without angina pectoris: Secondary | ICD-10-CM | POA: Diagnosis not present

## 2016-08-09 DIAGNOSIS — Z9981 Dependence on supplemental oxygen: Secondary | ICD-10-CM | POA: Diagnosis not present

## 2016-08-09 DIAGNOSIS — I129 Hypertensive chronic kidney disease with stage 1 through stage 4 chronic kidney disease, or unspecified chronic kidney disease: Secondary | ICD-10-CM | POA: Diagnosis not present

## 2016-08-09 DIAGNOSIS — E785 Hyperlipidemia, unspecified: Secondary | ICD-10-CM | POA: Insufficient documentation

## 2016-08-09 DIAGNOSIS — Z79899 Other long term (current) drug therapy: Secondary | ICD-10-CM | POA: Insufficient documentation

## 2016-08-09 DIAGNOSIS — Z794 Long term (current) use of insulin: Secondary | ICD-10-CM | POA: Insufficient documentation

## 2016-08-09 DIAGNOSIS — K219 Gastro-esophageal reflux disease without esophagitis: Secondary | ICD-10-CM | POA: Diagnosis not present

## 2016-08-09 DIAGNOSIS — D649 Anemia, unspecified: Secondary | ICD-10-CM | POA: Insufficient documentation

## 2016-08-09 DIAGNOSIS — N184 Chronic kidney disease, stage 4 (severe): Secondary | ICD-10-CM | POA: Diagnosis not present

## 2016-08-09 DIAGNOSIS — E669 Obesity, unspecified: Secondary | ICD-10-CM | POA: Diagnosis not present

## 2016-08-09 DIAGNOSIS — N185 Chronic kidney disease, stage 5: Secondary | ICD-10-CM | POA: Diagnosis not present

## 2016-08-09 DIAGNOSIS — Z6841 Body Mass Index (BMI) 40.0 and over, adult: Secondary | ICD-10-CM | POA: Insufficient documentation

## 2016-08-09 DIAGNOSIS — Z4931 Encounter for adequacy testing for hemodialysis: Secondary | ICD-10-CM

## 2016-08-09 HISTORY — PX: AV FISTULA PLACEMENT: SHX1204

## 2016-08-09 LAB — GLUCOSE, CAPILLARY
GLUCOSE-CAPILLARY: 73 mg/dL (ref 65–99)
GLUCOSE-CAPILLARY: 70 mg/dL (ref 65–99)
Glucose-Capillary: 85 mg/dL (ref 65–99)

## 2016-08-09 SURGERY — ARTERIOVENOUS (AV) FISTULA CREATION
Anesthesia: Monitor Anesthesia Care | Site: Arm Upper | Laterality: Left

## 2016-08-09 MED ORDER — PROTAMINE SULFATE 10 MG/ML IV SOLN
INTRAVENOUS | Status: DC | PRN
  Administered 2016-08-09: 40 mg via INTRAVENOUS
  Administered 2016-08-09: 10 mg via INTRAVENOUS

## 2016-08-09 MED ORDER — SODIUM CHLORIDE 0.9 % IV SOLN
INTRAVENOUS | Status: DC
  Administered 2016-08-09: 12:00:00 via INTRAVENOUS

## 2016-08-09 MED ORDER — MIDAZOLAM HCL 2 MG/2ML IJ SOLN
INTRAMUSCULAR | Status: AC
  Filled 2016-08-09: qty 2

## 2016-08-09 MED ORDER — DEXTROSE 5 % IV SOLN
1.5000 g | INTRAVENOUS | Status: AC
  Administered 2016-08-09: 1.5 g via INTRAVENOUS
  Filled 2016-08-09: qty 1.5

## 2016-08-09 MED ORDER — FENTANYL CITRATE (PF) 100 MCG/2ML IJ SOLN
INTRAMUSCULAR | Status: AC
  Filled 2016-08-09: qty 2

## 2016-08-09 MED ORDER — HEPARIN SODIUM (PORCINE) 1000 UNIT/ML IJ SOLN
INTRAMUSCULAR | Status: DC | PRN
  Administered 2016-08-09: 5000 [IU] via INTRAVENOUS

## 2016-08-09 MED ORDER — EPHEDRINE 5 MG/ML INJ
INTRAVENOUS | Status: AC
  Filled 2016-08-09: qty 10

## 2016-08-09 MED ORDER — ONDANSETRON HCL 4 MG/2ML IJ SOLN
INTRAMUSCULAR | Status: DC | PRN
  Administered 2016-08-09: 4 mg via INTRAVENOUS

## 2016-08-09 MED ORDER — OXYCODONE-ACETAMINOPHEN 5-325 MG PO TABS: 1.0000 | ORAL_TABLET | Freq: Four times a day (QID) | ORAL | 0 refills | Status: DC | PRN

## 2016-08-09 MED ORDER — HEPARIN SODIUM (PORCINE) 5000 UNIT/ML IJ SOLN
INTRAMUSCULAR | Status: DC | PRN
  Administered 2016-08-09: 500 mL

## 2016-08-09 MED ORDER — HEPARIN SODIUM (PORCINE) 1000 UNIT/ML IJ SOLN
INTRAMUSCULAR | Status: AC
  Filled 2016-08-09: qty 1

## 2016-08-09 MED ORDER — LIDOCAINE 2% (20 MG/ML) 5 ML SYRINGE
INTRAMUSCULAR | Status: AC
  Filled 2016-08-09: qty 5

## 2016-08-09 MED ORDER — MIDAZOLAM HCL 5 MG/5ML IJ SOLN
INTRAMUSCULAR | Status: DC | PRN
  Administered 2016-08-09: 2 mg via INTRAVENOUS

## 2016-08-09 MED ORDER — FENTANYL CITRATE (PF) 100 MCG/2ML IJ SOLN
INTRAMUSCULAR | Status: DC | PRN
  Administered 2016-08-09 (×2): 25 ug via INTRAVENOUS
  Administered 2016-08-09: 50 ug via INTRAVENOUS

## 2016-08-09 MED ORDER — 0.9 % SODIUM CHLORIDE (POUR BTL) OPTIME
TOPICAL | Status: DC | PRN
  Administered 2016-08-09: 1000 mL

## 2016-08-09 MED ORDER — PROTAMINE SULFATE 10 MG/ML IV SOLN
INTRAVENOUS | Status: AC
  Filled 2016-08-09: qty 5

## 2016-08-09 MED ORDER — LIDOCAINE HCL (PF) 1 % IJ SOLN
INTRAMUSCULAR | Status: DC | PRN
  Administered 2016-08-09: 30 mL via SUBCUTANEOUS

## 2016-08-09 MED ORDER — INSULIN GLARGINE 100 UNIT/ML SOLOSTAR PEN: PEN_INJECTOR | SUBCUTANEOUS | 0 refills | Status: DC

## 2016-08-09 MED ORDER — CHLORHEXIDINE GLUCONATE CLOTH 2 % EX PADS: 6.0000 | MEDICATED_PAD | Freq: Once | CUTANEOUS | Status: DC

## 2016-08-09 MED ORDER — LIDOCAINE HCL (PF) 1 % IJ SOLN
INTRAMUSCULAR | Status: AC
  Filled 2016-08-09: qty 30

## 2016-08-09 MED ORDER — ONDANSETRON HCL 4 MG/2ML IJ SOLN
INTRAMUSCULAR | Status: AC
  Filled 2016-08-09: qty 2

## 2016-08-09 MED ORDER — ROCURONIUM BROMIDE 50 MG/5ML IV SOSY
PREFILLED_SYRINGE | INTRAVENOUS | Status: AC
  Filled 2016-08-09: qty 5

## 2016-08-09 MED ORDER — PROPOFOL 10 MG/ML IV BOLUS
INTRAVENOUS | Status: AC
  Filled 2016-08-09: qty 20

## 2016-08-09 MED ORDER — SUCCINYLCHOLINE CHLORIDE 200 MG/10ML IV SOSY
PREFILLED_SYRINGE | INTRAVENOUS | Status: AC
  Filled 2016-08-09: qty 10

## 2016-08-09 MED ORDER — PROPOFOL 500 MG/50ML IV EMUL
INTRAVENOUS | Status: DC | PRN
  Administered 2016-08-09: 75 ug/kg/min via INTRAVENOUS

## 2016-08-09 MED ORDER — PHENYLEPHRINE 40 MCG/ML (10ML) SYRINGE FOR IV PUSH (FOR BLOOD PRESSURE SUPPORT)
PREFILLED_SYRINGE | INTRAVENOUS | Status: AC
  Filled 2016-08-09: qty 10

## 2016-08-09 SURGICAL SUPPLY — 33 items
ADH SKN CLS APL DERMABOND .7 (GAUZE/BANDAGES/DRESSINGS) ×1
ARMBAND PINK RESTRICT EXTREMIT (MISCELLANEOUS) ×4 IMPLANT
CANISTER SUCTION 2500CC (MISCELLANEOUS) ×2 IMPLANT
CANNULA VESSEL 3MM 2 BLNT TIP (CANNULA) ×2 IMPLANT
CLIP TI MEDIUM 6 (CLIP) ×2 IMPLANT
CLIP TI WIDE RED SMALL 6 (CLIP) ×2 IMPLANT
COVER PROBE W GEL 5X96 (DRAPES) ×1 IMPLANT
DECANTER SPIKE VIAL GLASS SM (MISCELLANEOUS) ×2 IMPLANT
DERMABOND ADVANCED (GAUZE/BANDAGES/DRESSINGS) ×1
DERMABOND ADVANCED .7 DNX12 (GAUZE/BANDAGES/DRESSINGS) ×1 IMPLANT
DRAIN PENROSE 1/4X12 LTX STRL (WOUND CARE) ×2 IMPLANT
ELECT REM PT RETURN 9FT ADLT (ELECTROSURGICAL) ×2
ELECTRODE REM PT RTRN 9FT ADLT (ELECTROSURGICAL) ×1 IMPLANT
GLOVE BIO SURGEON STRL SZ 6.5 (GLOVE) ×2 IMPLANT
GLOVE BIO SURGEON STRL SZ7.5 (GLOVE) ×2 IMPLANT
GLOVE BIO SURGEON STRL SZ8.5 (GLOVE) ×1 IMPLANT
GLOVE BIOGEL PI IND STRL 6 (GLOVE) IMPLANT
GLOVE BIOGEL PI INDICATOR 6 (GLOVE) ×1
GOWN STRL REUS W/ TWL LRG LVL3 (GOWN DISPOSABLE) ×3 IMPLANT
GOWN STRL REUS W/TWL LRG LVL3 (GOWN DISPOSABLE) ×6
KIT BASIN OR (CUSTOM PROCEDURE TRAY) ×2 IMPLANT
KIT ROOM TURNOVER OR (KITS) ×2 IMPLANT
LOOP VESSEL MINI RED (MISCELLANEOUS) IMPLANT
NS IRRIG 1000ML POUR BTL (IV SOLUTION) ×2 IMPLANT
PACK CV ACCESS (CUSTOM PROCEDURE TRAY) ×2 IMPLANT
PAD ARMBOARD 7.5X6 YLW CONV (MISCELLANEOUS) ×4 IMPLANT
SPONGE SURGIFOAM ABS GEL 100 (HEMOSTASIS) IMPLANT
SUT PROLENE 7 0 BV 1 (SUTURE) ×4 IMPLANT
SUT VIC AB 3-0 SH 27 (SUTURE) ×2
SUT VIC AB 3-0 SH 27X BRD (SUTURE) ×1 IMPLANT
SUT VICRYL 4-0 PS2 18IN ABS (SUTURE) ×2 IMPLANT
UNDERPAD 30X30 (UNDERPADS AND DIAPERS) ×2 IMPLANT
WATER STERILE IRR 1000ML POUR (IV SOLUTION) ×2 IMPLANT

## 2016-08-09 NOTE — Discharge Instructions (Signed)
° ° °  08/09/2016 DECLIN RAJAN [id number] 08/20/1950  Surgeon(s): [name], MD  Procedure(s): BRACHIOCEPHALIC ARTERIOVENOUS (AV) FISTULA CREATION LEFT ARM  x Do not stick fistula for 12 weeks

## 2016-08-09 NOTE — Anesthesia Preprocedure Evaluation (Signed)
Anesthesia Evaluation  Patient identified by MRN, date of birth, ID band Patient awake    Reviewed: Allergy & Precautions, NPO status , Patient's Chart, lab work & pertinent test results  Airway Mallampati: II  TM Distance: >3 FB Neck ROM: Full    Dental no notable dental hx.    Pulmonary shortness of breath, sleep apnea , pneumonia, COPD, former smoker,  Uses home O2   Pulmonary exam normal breath sounds clear to auscultation       Cardiovascular hypertension, Pt. on medications + CAD  Normal cardiovascular exam Rhythm:Regular Rate:Normal     Neuro/Psych PSYCHIATRIC DISORDERS negative neurological ROS  negative psych ROS   GI/Hepatic Neg liver ROS, hiatal hernia, PUD, GERD  ,  Endo/Other  diabetes, Type 2Morbid obesity  Renal/GU Renal Insufficiency, ESRF and CRFRenal disease     Musculoskeletal  (+) Arthritis ,   Abdominal   Peds  Hematology  (+) anemia ,   Anesthesia Other Findings   Reproductive/Obstetrics negative OB ROS                             Anesthesia Physical  Anesthesia Plan  ASA: IV  Anesthesia Plan: MAC   Post-op Pain Management:    Induction: Intravenous  Airway Management Planned:   Additional Equipment:   Intra-op Plan:   Post-operative Plan:   Informed Consent: I have reviewed the patients History and Physical, chart, labs and discussed the procedure including the risks, benefits and alternatives for the proposed anesthesia with the patient or authorized representative who has indicated his/her understanding and acceptance.   Dental advisory given  Plan Discussed with: CRNA and Surgeon  Anesthesia Plan Comments:         Anesthesia Quick Evaluation

## 2016-08-09 NOTE — H&P (Signed)
Referring Physician: Dr Hinda Lenis   Patient name: [name] [name]         MRN: [id number]        DOB: [birth year]        Sex: male   REASON FOR CONSULT: hemodialysis access   HPI: [name] [name] is a 66 y.o. male, referred for placement of a permanent hemodialysis access. The patient's recent serum creatinine was 3.7. He is CK D4. He is right handed. He denies any history of pacemaker AICD. He has no prior access procedures. Not currently on hemodialysis. Other medical problems include coronary artery disease, chronic back pain, COPD, hypertension, upper lipidemia, sleep apnea all of which have been stable.          Past Medical History:  Diagnosis Date  . Arthritis    . Bell palsy    . CAD (coronary artery disease)      a. 2014 MV: abnl w/ infap ischemia; b. 03/2013 Cath: aneurysmal bleb in the LAD w/ otw nonobs dzs-->Med Rx.  . Chronic back pain    . Chronic knee pain      a. 09/2015 s/p R TKA.  Marland Kitchen Chronic pain    . Chronic shoulder pain    . Chronic sinusitis    . CKD (chronic kidney disease), stage IV ([place])      stage 4 per office visit note of Dr Lowanda Foster on 05/2015   . COPD (chronic obstructive pulmonary disease) ([place])    . Diabetes mellitus without complication ([place])    . Essential hypertension    . GERD (gastroesophageal reflux disease)    . Hepatomegaly      noted on noncontrast CT 2015  . Hyperlipidemia    . Lateral meniscus tear    . Obesity      [place]un[place]  . Obstructive sleep apnea      cpap - settings at 4   . On home oxygen therapy      uses 2l when is going somewhere per patient   . Post traumatic stress disorder    . PUD (peptic ulcer disease)      remote, reports f/u EGD about 8 years ago unremarkable   . Reactive airway disease      related to exposure to chemical during 9/11  . Sinusitis    . Vitamin D deficiency               Past Surgical History:  Procedure Laterality Date  . ASAD LT SHOULDER   12/2008    left shoulder  . CATARACT EXTRACTION  W/PHACO Left 03/28/2016    Procedure: CATARACT EXTRACTION PHACO AND INTRAOCULAR LENS PLACEMENT LEFT EYE;  Surgeon: [name], MD;  Location: [facility];  Service: Ophthalmology;  Laterality: Left;  CDE: 4.77  . CATARACT EXTRACTION W/PHACO Right 04/11/2016    Procedure: CATARACT EXTRACTION PHACO AND INTRAOCULAR LENS PLACEMENT RIGHT EYE; CDE:  4.74;  Surgeon: [name], MD;  Location: [facility];  Service: Ophthalmology;  Laterality: Right;  . COLONOSCOPY   10/2008    Fields: Rectal polyp obliterated, not retrieved, hemorrhoids, single ascending colon diverticulum near the CV. Next colonoscopy April 2020  . COLONOSCOPY N/A 12/25/2014    SLF: 1. Colorectal polyps (2) removed 2. Small internal hemorrhoids 3. the left colon is severely redundant  . DOPPLER ECHOCARDIOGRAPHY      . ESOPHAGOGASTRODUODENOSCOPY N/A 12/25/2014    SLF: 1. Anemia most likely due to CRI, gastritis, gastric polyps 2. Moderate non-erosive gastriits  and mild duodenitis.  3.TWo large gstric polyps removed.   Marland Kitchen EYE SURGERY   12/22/2010    tear duct probing-[place]  . FOREIGN BODY REMOVAL   03/29/2011    Procedure: REMOVAL FOREIGN BODY EXTREMITY;  Surgeon: Arther Abbott, MD;  Location: [facility];  Service: Orthopedics;  Laterality: Right;  Removal Foreign Body Right Thumb  . KNEE ARTHROSCOPY   10/2007    left  . KNEE ARTHROSCOPY WITH LATERAL MENISECTOMY Right 10/14/2015    Procedure: LEFT KNEE ARTHROSCOPY WITH PARTIAL LATERAL MENISECTOMY;  Surgeon: Carole Civil, MD;  Location: [facility];  Service: Orthopedics;  Laterality: Right;  . LEFT HEART CATHETERIZATION WITH CORONARY ANGIOGRAM N/A 03/28/2013    Procedure: LEFT HEART CATHETERIZATION WITH CORONARY ANGIOGRAM;  Surgeon: Leonie Man, MD;  Location: [facility] CATH LAB;  Service: Cardiovascular;  Laterality: N/A;  . NM MYOVIEW LTD      . PENILE PROSTHESIS IMPLANT N/A 08/16/2015    Procedure: PENILE PROTHESIS INFLATABLE, three piece, Excisional biopsy of Penile ulcer, Penile molding;  Surgeon:  Carolan Clines, MD;  Location: [facility];  Service: Urology;  Laterality: N/A;  . QUADRICEPS TENDON REPAIR   07/21/2011    Procedure: REPAIR QUADRICEP TENDON;  Surgeon: Arther Abbott, MD;  Location: [facility];  Service: Orthopedics;  Laterality: Right;  . TOENAIL EXCISION        removed [id number]  . UMBILICAL HERNIA REPAIR   2007    [place]                 Family History  Problem Relation Age of Onset  . Hypertension Mother        MI  . Cancer Mother        breast   . Diabetes Mother    . Diabetes Father    . Hypertension Father    . Hypertension Sister    . Diabetes Sister    . Arthritis      . Asthma      . Lung disease      . Anesthesia problems Neg Hx    . Hypotension Neg Hx    . Malignant hyperthermia Neg Hx    . Pseudochol deficiency Neg Hx    . Colon cancer Neg Hx        SOCIAL HISTORY: Social History             Social History  . Marital status: Married      Spouse name: N/A  . Number of children: 2  . Years of education: 12th grade           Occupational History  . disabled    .      Marland Kitchen   Unemployed               Social History Main Topics  . Smoking status: Former Smoker      Packs/day: 1.00      Years: 25.00      Types: Cigarettes      Quit date: 03/27/2010  . Smokeless tobacco: Never Used        Comment: Quit x 7 years  . Alcohol use 0.0 oz/week        Comment: occasionally  . Drug use:        Types: Cocaine, Marijuana        Comment: stopped feb2011  . Sexual activity: Not on file           Other Topics Concern  . Not on file  Social History Narrative    He quit smoking in 2010. 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 He developed pulmonary problems, became disabled because of lower airway disease in 2009.              Allergies  Allergen Reactions  . Opana [Oxymorphone Hcl] Itching  . Tramadol Itching            Current Outpatient Prescriptions  Medication Sig Dispense Refill    . albuterol (PROVENTIL HFA) 108 (90 BASE) MCG/ACT inhaler Inhale 2 puffs into the lungs every 4 (four) hours as needed for wheezing.       Marland Kitchen amLODipine (NORVASC) 10 MG tablet Take 10 mg by mouth daily.       Marland Kitchen aspirin EC 81 MG tablet Take 1 tablet (81 mg total) by mouth daily. 90 tablet 3  . budesonide-formoterol (SYMBICORT) 160-4.5 MCG/ACT inhaler Inhale 2 puffs into the lungs daily as needed (Shortness of Breath).       . cetirizine (ZYRTEC) 10 MG tablet Take 10 mg by mouth daily as needed for allergies.       . Cholecalciferol (VITAMIN D3) 5000 units CAPS Take 1 capsule (5,000 Units total) by mouth daily. (Patient taking differently: Take 2,000 Units by mouth daily. ) 90 capsule 0  . hydrALAZINE (APRESOLINE) 10 MG tablet Take 1 tablet (10 mg total) by mouth 3 (three) times daily. Keep appointment. (Patient taking differently: Take 25 mg by mouth 3 (three) times daily. Keep appointment.) 270 tablet 0  . Insulin Glargine (LANTUS SOLOSTAR) 100 UNIT/ML Solostar Pen Inject 40 Units into the skin daily at 10 pm. 15 pen 0  . iron polysaccharides (NIFEREX) 150 MG capsule Take 150 mg by mouth daily.      . mometasone (NASONEX) 50 MCG/ACT nasal spray Place 2 sprays into the nose daily as needed (allergies).       . pantoprazole (PROTONIX) 40 MG tablet 1 PO 30 MINS PRIOR TO MEALS BID 180 tablet 3  . simvastatin (ZOCOR) 20 MG tablet Take 20 mg by mouth every morning.       . TRADJENTA 5 MG TABS tablet TAKE 1 TABLET EVERY DAY 90 tablet 0    No current facility-administered medications for this visit.       ROS:    General:  No weight loss, Fever, chills   HEENT: No recent headaches, no nasal bleeding, no visual changes, no sore throat   Neurologic: No dizziness, blackouts, seizures. No recent symptoms of stroke or mini- stroke. No recent episodes of slurred speech, or temporary blindness.   Cardiac: No recent episodes of chest pain/pressure, no shortness of breath at rest.  No shortness of breath  with exertion.  Denies history of atrial fibrillation or irregular heartbeat   Vascular: No history of rest pain in feet.  No history of claudication.  No history of non-healing ulcer, No history of DVT    Pulmonary: No home oxygen, no productive cough, no hemoptysis,  No asthma or wheezing   Musculoskeletal:  [ ]  Arthritis, [X]  Low back pain,  [X]  Joint pain   Hematologic:No history of hypercoagulable state.  No history of easy bleeding.  No history of anemia   Gastrointestinal: No hematochezia or melena,  No gastroesophageal reflux, no trouble swallowing   Urinary: [X]  chronic Kidney disease, [ ]  on HD - [ ]  MWF or [ ]  TTHS, [ ]  Burning with urination, [ ]  Frequent urination, [ ]  Difficulty  urinating;    Skin: No rashes   Psychological: No history of anxiety,  No history of depression     Physical Examination    Vitals:   08/09/16 0955  BP: (!) 155/98  Pulse: 77  Resp: 18  Temp: 97.9 F (36.6 C)  TempSrc: Oral  SpO2: 96%  Weight: 243 lb (110.2 kg)  Height: 5\' 4"  (1.626 m)     General:  Alert and oriented, no acute distress HEENT: Normal Neck: No JVD Extremity Pulses:  2+ radial, brachial  pulses bilaterally Musculoskeletal: No deformity or edema      Neurologic: Upper and lower extremity motor 5/5 and symmetric   DATA: Pt had bilateral upper extremity vein mapping and bilateral upper extremity arterial duplex exam in October 2017. Radial artery was 2 mm at the wrist bilaterally. Brachial artery was 5-6 m. Normal location.   Right cephalic vein was 3-6 mm from the distal forearm to the upper arm. Right basilic vein was 3-5 mm. Left cephalic vein was less than 2 mm at the wrist 3-5 mm in the upper arm     ASSESSMENT:  Patient needs long-term hemodialysis access. Anatomy is best suited for a left brachiocephalic AV fistula.     PLAN:  Risks benefits possible complications and procedure details including but not limited to non-maturation of the fistula, bleeding,  infection, ischemic steal were explained to the patient today as well as his wife. They understand and agree to proceed. This is scheduled for 07/26/2016.     Ruta Hinds, MD [facility] Office: [phone] Pager: [phone]

## 2016-08-09 NOTE — Progress Notes (Signed)
Peripheral IV removed with cath intact

## 2016-08-09 NOTE — Transfer of Care (Signed)
Immediate Anesthesia Transfer of Care Note  Patient: [name] [name]  Procedure(s) Performed: Procedure(s): BRACHIOCEPHALIC ARTERIOVENOUS (AV) FISTULA CREATION LEFT ARM (Left)  Patient Location: PACU  Anesthesia Type:MAC  Level of Consciousness: awake, alert , oriented and patient cooperative  Airway & Oxygen Therapy: Patient Spontanous Breathing and Patient connected to nasal cannula oxygen  Post-op Assessment: Report given to RN, Post -op Vital signs reviewed and stable, Patient moving all extremities and Patient moving all extremities X 4  Post vital signs: Reviewed and stable  Last Vitals:  Vitals:   08/09/16 0955 08/09/16 1405  BP: (!) 155/98   Pulse: 77   Resp: 18   Temp: 36.6 C (P) 36.1 C    Last Pain:  Vitals:   08/09/16 0955  TempSrc: Oral  PainSc:       Patients Stated Pain Goal: 4 (35/45/62 [id number])  Complications: No apparent anesthesia complications

## 2016-08-09 NOTE — Op Note (Signed)
Procedure: Left Brachial Cephalic AV fistula  Preop: ESRD  Postop: ESRD  Anesthesia: Local with sedation  Assistant: Leontine Locket PA-C  Findings: 3 mm cephalic vein  Procedure: After obtaining informed consent, the patient was taken to the operating room.  After adequate sedation, the left upper extremity was prepped and draped in usual sterile fashion.  A transverse incision was then made near the antecubital crease the left arm. The incision was carried into the subcutaneous tissues down to level of the cephalic vein. The cephalic vein was approximately 3 mm in diameter. It was of good quality. This was dissected free circumferentially and small side branches ligated and divided between silk ties or clips. Next the brachial artery was dissected free in the medial portion of the incision. The artery was  3-4 mm in diameter. The vessel loops were placed proximal and distal to the planned site of arteriotomy. The patient was given 5000 units of intravenous heparin. After appropriate circulation time, the vessel loops were used to control the artery. A longitudinal opening was made in the brachial artery.  The vein was ligated distally with a 2-0 silk tie. The vein was controlled proximally with a fine bulldog clamp. The vein was then swung over to the artery and sewn end of vein to side of artery using a running 7-0 Prolene suture. Just prior to completion of the anastomosis, everything was fore bled back bled and thoroughly flushed. The anastomosis was secured, vessel loops released, and there was a palpable thrill in the fistula immediately. After hemostasis was obtained, the subcutaneous tissues were reapproximated using a running 3-0 Vicryl suture. The skin was then closed with a 4 0 Vicryl subcuticular stitch. Dermabond was applied to the skin incision.  The patient had a palpable radial pulse at the end of the case.  Ruta Hinds, MD [facility] Office:  [phone] Pager: [phone]

## 2016-08-10 ENCOUNTER — Encounter (HOSPITAL_COMMUNITY): Payer: Self-pay | Admitting: Vascular Surgery

## 2016-08-10 LAB — POCT I-STAT 4, (NA,K, GLUC, HGB,HCT)
Glucose, Bld: 85 mg/dL (ref 65–99)
HEMATOCRIT: 33 % — AB (ref 39.0–52.0)
Hemoglobin: 11.2 g/dL — ABNORMAL LOW (ref 13.0–17.0)
Potassium: 4 mmol/L (ref 3.5–5.1)
Sodium: 143 mmol/L (ref 135–145)

## 2016-08-10 NOTE — Anesthesia Postprocedure Evaluation (Signed)
Anesthesia Post Note  Patient: [name] [name]  Procedure(s) Performed: Procedure(s) (LRB): BRACHIOCEPHALIC ARTERIOVENOUS (AV) FISTULA CREATION LEFT ARM (Left)  Patient location during evaluation: PACU Anesthesia Type: MAC Level of consciousness: awake and alert Pain management: pain level controlled Vital Signs Assessment: post-procedure vital signs reviewed and stable Respiratory status: spontaneous breathing Cardiovascular status: stable Anesthetic complications: no       Last Vitals:  Vitals:   08/09/16 1425 08/09/16 1439  BP: (!) 155/87 (!) 141/81  Pulse: 74 79  Resp: 20 16  Temp: 36.7 C     Last Pain:  Vitals:   08/09/16 1439  TempSrc:   PainSc: 0-No pain                 Nolon Nations

## 2016-08-11 ENCOUNTER — Telehealth: Payer: Self-pay | Admitting: Vascular Surgery

## 2016-08-11 NOTE — Telephone Encounter (Signed)
-----   Message from Mena Goes, RN sent at 08/09/2016  4:14 PM EST ----- Regarding: schedule   ----- Message ----- From: Gabriel Earing, PA-C Sent: 08/09/2016   1:52 PM To: [facility] Charge Pool  S/p left BC AVF 08/09/16.  F/u with Dr. Oneida Alar in 4-6 weeks with duplex.  Thanks [employer]

## 2016-08-11 NOTE — Telephone Encounter (Signed)
spoke to spouse and confirmed appt times and date, mailed letter for appt 3/8 [place] 11, PO 12:30

## 2016-08-14 DIAGNOSIS — H04553 Acquired stenosis of bilateral nasolacrimal duct: Secondary | ICD-10-CM | POA: Diagnosis not present

## 2016-08-14 DIAGNOSIS — H04223 Epiphora due to insufficient drainage, bilateral lacrimal glands: Secondary | ICD-10-CM | POA: Diagnosis not present

## 2016-08-14 DIAGNOSIS — H02836 Dermatochalasis of left eye, unspecified eyelid: Secondary | ICD-10-CM | POA: Diagnosis not present

## 2016-08-14 DIAGNOSIS — H02833 Dermatochalasis of right eye, unspecified eyelid: Secondary | ICD-10-CM | POA: Diagnosis not present

## 2016-08-14 DIAGNOSIS — H02835 Dermatochalasis of left lower eyelid: Secondary | ICD-10-CM | POA: Diagnosis not present

## 2016-08-14 DIAGNOSIS — Z9841 Cataract extraction status, right eye: Secondary | ICD-10-CM | POA: Diagnosis not present

## 2016-08-14 DIAGNOSIS — H02832 Dermatochalasis of right lower eyelid: Secondary | ICD-10-CM | POA: Diagnosis not present

## 2016-08-14 DIAGNOSIS — Z79899 Other long term (current) drug therapy: Secondary | ICD-10-CM | POA: Diagnosis not present

## 2016-08-14 DIAGNOSIS — Z7902 Long term (current) use of antithrombotics/antiplatelets: Secondary | ICD-10-CM | POA: Diagnosis not present

## 2016-08-14 DIAGNOSIS — Z87891 Personal history of nicotine dependence: Secondary | ICD-10-CM | POA: Diagnosis not present

## 2016-08-14 DIAGNOSIS — Z885 Allergy status to narcotic agent status: Secondary | ICD-10-CM | POA: Diagnosis not present

## 2016-08-14 DIAGNOSIS — Z794 Long term (current) use of insulin: Secondary | ICD-10-CM | POA: Diagnosis not present

## 2016-08-14 DIAGNOSIS — Z9842 Cataract extraction status, left eye: Secondary | ICD-10-CM | POA: Diagnosis not present

## 2016-08-15 DIAGNOSIS — Z961 Presence of intraocular lens: Secondary | ICD-10-CM | POA: Diagnosis not present

## 2016-08-21 ENCOUNTER — Other Ambulatory Visit (HOSPITAL_COMMUNITY): Payer: Self-pay | Admitting: Pulmonary Disease

## 2016-08-21 ENCOUNTER — Ambulatory Visit (HOSPITAL_COMMUNITY)
Admission: RE | Admit: 2016-08-21 | Discharge: 2016-08-21 | Disposition: A | Discharge status: Home / Self Care | Payer: Medicare Other | Source: Ambulatory Visit | Attending: Pulmonary Disease | Admitting: Pulmonary Disease

## 2016-08-21 DIAGNOSIS — R0602 Shortness of breath: Secondary | ICD-10-CM | POA: Diagnosis not present

## 2016-08-21 DIAGNOSIS — R0600 Dyspnea, unspecified: Secondary | ICD-10-CM | POA: Diagnosis not present

## 2016-08-21 DIAGNOSIS — J9811 Atelectasis: Secondary | ICD-10-CM | POA: Diagnosis not present

## 2016-08-23 DIAGNOSIS — N184 Chronic kidney disease, stage 4 (severe): Secondary | ICD-10-CM | POA: Diagnosis not present

## 2016-08-23 DIAGNOSIS — I10 Essential (primary) hypertension: Secondary | ICD-10-CM | POA: Diagnosis not present

## 2016-08-23 DIAGNOSIS — D638 Anemia in other chronic diseases classified elsewhere: Secondary | ICD-10-CM | POA: Diagnosis not present

## 2016-08-23 DIAGNOSIS — R809 Proteinuria, unspecified: Secondary | ICD-10-CM | POA: Diagnosis not present

## 2016-08-23 DIAGNOSIS — E1129 Type 2 diabetes mellitus with other diabetic kidney complication: Secondary | ICD-10-CM | POA: Diagnosis not present

## 2016-08-25 DIAGNOSIS — I10 Essential (primary) hypertension: Secondary | ICD-10-CM | POA: Diagnosis not present

## 2016-08-25 DIAGNOSIS — R809 Proteinuria, unspecified: Secondary | ICD-10-CM | POA: Diagnosis not present

## 2016-08-25 DIAGNOSIS — D638 Anemia in other chronic diseases classified elsewhere: Secondary | ICD-10-CM | POA: Diagnosis not present

## 2016-08-25 DIAGNOSIS — N184 Chronic kidney disease, stage 4 (severe): Secondary | ICD-10-CM | POA: Diagnosis not present

## 2016-08-25 DIAGNOSIS — E1129 Type 2 diabetes mellitus with other diabetic kidney complication: Secondary | ICD-10-CM | POA: Diagnosis not present

## 2016-08-28 ENCOUNTER — Other Ambulatory Visit: Payer: Self-pay | Admitting: "Endocrinology

## 2016-08-30 DIAGNOSIS — R809 Proteinuria, unspecified: Secondary | ICD-10-CM | POA: Diagnosis not present

## 2016-08-30 DIAGNOSIS — N2581 Secondary hyperparathyroidism of renal origin: Secondary | ICD-10-CM | POA: Diagnosis not present

## 2016-08-30 DIAGNOSIS — D638 Anemia in other chronic diseases classified elsewhere: Secondary | ICD-10-CM | POA: Diagnosis not present

## 2016-08-30 DIAGNOSIS — N184 Chronic kidney disease, stage 4 (severe): Secondary | ICD-10-CM | POA: Diagnosis not present

## 2016-09-04 ENCOUNTER — Other Ambulatory Visit (HOSPITAL_COMMUNITY): Payer: Self-pay | Admitting: Podiatry

## 2016-09-04 ENCOUNTER — Ambulatory Visit (HOSPITAL_COMMUNITY)
Admission: RE | Admit: 2016-09-04 | Discharge: 2016-09-04 | Disposition: A | Discharge status: Home / Self Care | Payer: Medicare Other | Source: Ambulatory Visit | Attending: Podiatry | Admitting: Podiatry

## 2016-09-04 DIAGNOSIS — M79673 Pain in unspecified foot: Secondary | ICD-10-CM | POA: Diagnosis not present

## 2016-09-04 DIAGNOSIS — T148XXA Other injury of unspecified body region, initial encounter: Secondary | ICD-10-CM | POA: Diagnosis not present

## 2016-09-04 DIAGNOSIS — M79671 Pain in right foot: Secondary | ICD-10-CM

## 2016-09-04 DIAGNOSIS — R609 Edema, unspecified: Secondary | ICD-10-CM | POA: Diagnosis not present

## 2016-09-04 DIAGNOSIS — M7989 Other specified soft tissue disorders: Secondary | ICD-10-CM | POA: Diagnosis not present

## 2016-09-07 DIAGNOSIS — N184 Chronic kidney disease, stage 4 (severe): Secondary | ICD-10-CM | POA: Diagnosis not present

## 2016-09-07 DIAGNOSIS — I10 Essential (primary) hypertension: Secondary | ICD-10-CM | POA: Diagnosis not present

## 2016-09-07 DIAGNOSIS — E1165 Type 2 diabetes mellitus with hyperglycemia: Secondary | ICD-10-CM | POA: Diagnosis not present

## 2016-09-14 ENCOUNTER — Encounter: Payer: Self-pay | Admitting: Vascular Surgery

## 2016-09-15 ENCOUNTER — Other Ambulatory Visit: Payer: Self-pay | Admitting: "Endocrinology

## 2016-09-15 DIAGNOSIS — E1122 Type 2 diabetes mellitus with diabetic chronic kidney disease: Secondary | ICD-10-CM | POA: Diagnosis not present

## 2016-09-15 DIAGNOSIS — Z794 Long term (current) use of insulin: Secondary | ICD-10-CM | POA: Diagnosis not present

## 2016-09-15 DIAGNOSIS — N184 Chronic kidney disease, stage 4 (severe): Secondary | ICD-10-CM | POA: Diagnosis not present

## 2016-09-15 LAB — COMPREHENSIVE METABOLIC PANEL
ALBUMIN: 3.6 g/dL (ref 3.6–5.1)
ALT: 10 U/L (ref 9–46)
AST: 14 U/L (ref 10–35)
Alkaline Phosphatase: 49 U/L (ref 40–115)
BUN: 52 mg/dL — ABNORMAL HIGH (ref 7–25)
CALCIUM: 8.9 mg/dL (ref 8.6–10.3)
CHLORIDE: 107 mmol/L (ref 98–110)
CO2: 24 mmol/L (ref 20–31)
Creat: 5.08 mg/dL — ABNORMAL HIGH (ref 0.70–1.25)
Glucose, Bld: 118 mg/dL — ABNORMAL HIGH (ref 65–99)
Potassium: 3.8 mmol/L (ref 3.5–5.3)
Sodium: 143 mmol/L (ref 135–146)
Total Bilirubin: 0.3 mg/dL (ref 0.2–1.2)
Total Protein: 6.5 g/dL (ref 6.1–8.1)

## 2016-09-16 LAB — HEMOGLOBIN A1C
Hgb A1c MFr Bld: 7 % — ABNORMAL HIGH (ref <5.7)
MEAN PLASMA GLUCOSE: 154 mg/dL

## 2016-09-18 DIAGNOSIS — M79673 Pain in unspecified foot: Secondary | ICD-10-CM | POA: Diagnosis not present

## 2016-09-18 DIAGNOSIS — R609 Edema, unspecified: Secondary | ICD-10-CM | POA: Diagnosis not present

## 2016-09-18 DIAGNOSIS — T148XXA Other injury of unspecified body region, initial encounter: Secondary | ICD-10-CM | POA: Diagnosis not present

## 2016-09-21 ENCOUNTER — Ambulatory Visit (INDEPENDENT_AMBULATORY_CARE_PROVIDER_SITE_OTHER): Payer: Medicare Other | Admitting: "Endocrinology

## 2016-09-21 ENCOUNTER — Ambulatory Visit (HOSPITAL_COMMUNITY)
Admission: RE | Admit: 2016-09-21 | Discharge: 2016-09-21 | Disposition: A | Discharge status: Home / Self Care | Payer: Medicare Other | Source: Ambulatory Visit | Attending: Vascular Surgery | Admitting: Vascular Surgery

## 2016-09-21 ENCOUNTER — Encounter: Payer: Self-pay | Admitting: "Endocrinology

## 2016-09-21 ENCOUNTER — Encounter: Payer: Self-pay | Admitting: Vascular Surgery

## 2016-09-21 ENCOUNTER — Ambulatory Visit (INDEPENDENT_AMBULATORY_CARE_PROVIDER_SITE_OTHER): Payer: Self-pay | Admitting: Vascular Surgery

## 2016-09-21 VITALS — BP 143/84 | HR 78 | Temp 97.6°F | Ht 64.0 in | Wt 246.0 lb

## 2016-09-21 VITALS — BP 134/80 | HR 77 | Wt 245.0 lb

## 2016-09-21 DIAGNOSIS — E559 Vitamin D deficiency, unspecified: Secondary | ICD-10-CM | POA: Diagnosis not present

## 2016-09-21 DIAGNOSIS — N184 Chronic kidney disease, stage 4 (severe): Secondary | ICD-10-CM

## 2016-09-21 DIAGNOSIS — Z794 Long term (current) use of insulin: Secondary | ICD-10-CM

## 2016-09-21 DIAGNOSIS — E1122 Type 2 diabetes mellitus with diabetic chronic kidney disease: Secondary | ICD-10-CM | POA: Diagnosis not present

## 2016-09-21 DIAGNOSIS — N186 End stage renal disease: Secondary | ICD-10-CM

## 2016-09-21 DIAGNOSIS — Z4931 Encounter for adequacy testing for hemodialysis: Secondary | ICD-10-CM | POA: Insufficient documentation

## 2016-09-21 DIAGNOSIS — E782 Mixed hyperlipidemia: Secondary | ICD-10-CM | POA: Diagnosis not present

## 2016-09-21 DIAGNOSIS — I10 Essential (primary) hypertension: Secondary | ICD-10-CM

## 2016-09-21 NOTE — Progress Notes (Signed)
Patient is a 66 year old male who had placement [cephalic AV fistula general 24th 2018. He has some occasional numbness in his left hand but this is not really bothersome to him. He denies any aching pain in the hand. He currently is not on hemodialysis.  Physical exam:  Vitals:   09/21/16 1122  BP: (!) 143/84  Pulse: 78  Temp: 97.6 F (36.4 C)  TempSrc: Oral  SpO2: 92%  Weight: 246 lb (111.6 kg)  Height: 5\' 4"  (1.626 m)    Left upper extremity: Palpable thrill in fistula audible bruit  Data: Duplex ultrasound shows patent left brachiocephalic AV fistula 5 mm in diameter 5 mm in depth  Assessment: Slowly maturing left arm AV fistula. Since he is currently not using this I would favor exercising the fistula now rather than any intervention. The patient will return in 6 weeks for repeat ultrasound. He'll call me if the steal symptoms get worse.  Ruta Hinds, MD [facility] [place] Office: [phone] Pager: [phone]

## 2016-09-21 NOTE — Patient Instructions (Signed)

## 2016-09-21 NOTE — Progress Notes (Signed)
Subjective:    Patient ID: [name] [name], male    DOB: [birth year], PCP [name], MD   Past Medical History:  Diagnosis Date  . Anemia   . Arthritis   . Bell palsy   . CAD (coronary artery disease)    a. 2014 MV: abnl w/ infap ischemia; b. 03/2013 Cath: aneurysmal bleb in the LAD w/ otw nonobs dzs-->Med Rx.  . Chronic back pain   . Chronic knee pain    a. 09/2015 s/p R TKA.  Marland Kitchen Chronic pain   . Chronic shoulder pain   . Chronic sinusitis   . CKD (chronic kidney disease), stage IV ([place])    stage 4 per office visit note of Dr Lowanda Foster on 05/2015   . COPD (chronic obstructive pulmonary disease) ([place])   . Diabetes mellitus without complication ([place])   . Dyspnea   . Essential hypertension   . GERD (gastroesophageal reflux disease)   . Hepatomegaly    noted on noncontrast CT 2015  . History of hiatal hernia   . Hyperlipidemia   . Lateral meniscus tear   . Obesity    [place]  . Obstructive sleep apnea    cpap - settings at 4   . On home oxygen therapy    uses 2l when is going somewhere per patient   . Pneumonia   . Post traumatic stress disorder   . PUD (peptic ulcer disease)    remote, reports f/u EGD about 8 years ago unremarkable   . Reactive airway disease    related to exposure to chemical during 9/11  . Sinusitis   . Vitamin D deficiency    Past Surgical History:  Procedure Laterality Date  . ASAD LT SHOULDER  12/2008   left shoulder  . AV FISTULA PLACEMENT Left 08/09/2016   Procedure: BRACHIOCEPHALIC ARTERIOVENOUS (AV) FISTULA CREATION LEFT ARM;  Surgeon: [name], MD;  Location: [facility];  Service: Vascular;  Laterality: Left;  . CATARACT EXTRACTION W/PHACO Left 03/28/2016   Procedure: CATARACT EXTRACTION PHACO AND INTRAOCULAR LENS PLACEMENT LEFT EYE;  Surgeon: [name], MD;  Location: [facility];  Service: Ophthalmology;  Laterality: Left;  CDE: 4.77  . CATARACT EXTRACTION W/PHACO Right 04/11/2016   Procedure: CATARACT EXTRACTION PHACO AND INTRAOCULAR  LENS PLACEMENT RIGHT EYE; CDE:  4.74;  Surgeon: [name], MD;  Location: [facility];  Service: Ophthalmology;  Laterality: Right;  . COLONOSCOPY  10/2008   Fields: Rectal polyp obliterated, not retrieved, hemorrhoids, single ascending colon diverticulum near the CV. Next colonoscopy April 2020  . COLONOSCOPY N/A 12/25/2014   SLF: 1. Colorectal polyps (2) removed 2. Small internal hemorrhoids 3. the left colon is severely redundant  . DOPPLER ECHOCARDIOGRAPHY    . ESOPHAGOGASTRODUODENOSCOPY N/A 12/25/2014   SLF: 1. Anemia most likely due to CRI, gastritis, gastric polyps 2. Moderate non-erosive gastriits and mild duodenitis.  3.TWo large gstric polyps removed.   Marland Kitchen EYE SURGERY  12/22/2010   tear duct probing-[place]  . FOREIGN BODY REMOVAL  03/29/2011   Procedure: REMOVAL FOREIGN BODY EXTREMITY;  Surgeon: Arther Abbott, MD;  Location: [facility];  Service: Orthopedics;  Laterality: Right;  Removal Foreign Body Right Thumb  . KNEE ARTHROSCOPY  10/2007   left  . KNEE ARTHROSCOPY WITH LATERAL MENISECTOMY Right 10/14/2015   Procedure: LEFT KNEE ARTHROSCOPY WITH PARTIAL LATERAL MENISECTOMY;  Surgeon: Carole Civil, MD;  Location: [facility];  Service: Orthopedics;  Laterality: Right;  . LEFT HEART CATHETERIZATION WITH CORONARY ANGIOGRAM N/A 03/28/2013  Procedure: LEFT HEART CATHETERIZATION WITH CORONARY ANGIOGRAM;  Surgeon: Leonie Man, MD;  Location: [facility] CATH LAB;  Service: Cardiovascular;  Laterality: N/A;  . NM MYOVIEW LTD    . PENILE PROSTHESIS IMPLANT N/A 08/16/2015   Procedure: PENILE PROTHESIS INFLATABLE, three piece, Excisional biopsy of Penile ulcer, Penile molding;  Surgeon: Carolan Clines, MD;  Location: [facility];  Service: Urology;  Laterality: N/A;  . QUADRICEPS TENDON REPAIR  07/21/2011   Procedure: REPAIR QUADRICEP TENDON;  Surgeon: Arther Abbott, MD;  Location: [facility];  Service: Orthopedics;  Laterality: Right;  . TOENAIL EXCISION     removed [id number]  . UMBILICAL HERNIA REPAIR   2007   [place]   Social History   Social History  . Marital status: Married    Spouse name: N/A  . Number of children: 2  . Years of education: 12th grade   Occupational History  . disabled   .     Marland Kitchen  Unemployed   Social History Main Topics  . Smoking status: Former Smoker    Packs/day: 1.00    Years: 25.00    Types: Cigarettes    Quit date: 03/27/2010  . Smokeless tobacco: Never Used     Comment: Quit x 7 years  . Alcohol use 0.0 oz/week     Comment: occasionally  . Drug use: Yes    Types: Cocaine, Marijuana     Comment: no cocaine for several years. Most recent use  of mairjuana was 06/2016  . Sexual activity: Not on file   Other Topics Concern  . Not on file   Social History Narrative   He quit smoking in 2010. 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 He developed pulmonary problems, became disabled because of lower airway disease in 2009.    Outpatient Encounter Prescriptions as of 09/21/2016  Medication Sig  . amLODipine (NORVASC) 10 MG tablet Take 10 mg by mouth daily.   Marland Kitchen aspirin EC 81 MG tablet Take 1 tablet (81 mg total) by mouth daily.  . budesonide-formoterol (SYMBICORT) 160-4.5 MCG/ACT inhaler Inhale 2 puffs into the lungs 2 (two) times daily as needed (Shortness of Breath).   . cetirizine (ZYRTEC) 10 MG tablet Take 10 mg by mouth at bedtime as needed for allergies.   . Cholecalciferol (VITAMIN D) 2000 units tablet Take 2,000 Units by mouth daily.  . hydrALAZINE (APRESOLINE) 25 MG tablet Take 25 mg by mouth 3 (three) times daily.  . Insulin Glargine (LANTUS SOLOSTAR) 100 UNIT/ML Solostar Pen INJECT 40 UNITS SUBCUTANEOUSLY AT BEDTIME.  . iron polysaccharides (NIFEREX) 150 MG capsule Take 150 mg by mouth 2 (two) times daily.   Marland Kitchen levofloxacin (LEVAQUIN) 500 MG tablet Take 500 mg by mouth daily.  . mometasone (NASONEX) 50 MCG/ACT nasal spray Place 2 sprays into the nose 2 (two) times daily as needed (allergies).   Marland Kitchen  oxyCODONE-acetaminophen (ROXICET) 5-325 MG tablet Take 1 tablet by mouth every 6 (six) hours as needed for severe pain.  . pantoprazole (PROTONIX) 40 MG tablet 1 PO 30 MINS PRIOR TO MEALS BID (Patient taking differently: Take 40 mg by mouth 2 (two) times daily before a meal. )  . Polyethyl Glycol-Propyl Glycol (SYSTANE OP) Apply 1 drop to eye daily as needed (dry eyes).  . predniSONE (DELTASONE) 10 MG tablet Take 10 mg by mouth daily with breakfast. Take 40mg  daily for 3 days, then 30mg  daily for 3 days, then 20mg  daily for 3 days, then 10mg  daily  for 3 days  . simvastatin (ZOCOR) 20 MG tablet Take 20 mg by mouth every morning.   . TRADJENTA 5 MG TABS tablet TAKE 1 TABLET EVERY DAY   No facility-administered encounter medications on file as of 09/21/2016.    ALLERGIES: Allergies  Allergen Reactions  . Opana [Oxymorphone Hcl] Itching  . Tramadol Itching   VACCINATION STATUS: Immunization History  Administered Date(s) Administered  . Td 03/27/2011    Diabetes  He presents for his follow-up diabetic visit. He has type 2 diabetes mellitus. Onset time: He was diagnosed approximately 20 years ago. His disease course has been stable. There are no hypoglycemic associated symptoms. Pertinent negatives for hypoglycemia include no confusion, headaches, pallor or seizures. Associated symptoms include polyuria. Pertinent negatives for diabetes include no chest pain, no fatigue, no polydipsia, no polyphagia and no weakness. There are no hypoglycemic complications. Symptoms are stable. Diabetic complications include nephropathy. Risk factors for coronary artery disease include dyslipidemia, diabetes mellitus, hypertension, obesity, sedentary lifestyle, tobacco exposure and male sex. Current diabetic treatment includes insulin injections and oral agent (monotherapy). He is compliant with treatment most of the time. He is following a generally unhealthy diet. He has had a previous visit with a dietitian. He  rarely participates in exercise. His breakfast blood glucose range is generally 140-180 mg/dl. Eye exam is current.  Hypertension  This is a chronic problem. The current episode started more than 1 year ago. The problem is controlled. Pertinent negatives include no chest pain, headaches, neck pain, palpitations or shortness of breath. Risk factors for coronary artery disease include diabetes mellitus, dyslipidemia, obesity, smoking/tobacco exposure and sedentary lifestyle. Past treatments include angiotensin blockers. Hypertensive end-organ damage includes kidney disease.  Hyperlipidemia  This is a chronic problem. The current episode started more than 1 year ago. Pertinent negatives include no chest pain, myalgias or shortness of breath. Current antihyperlipidemic treatment includes statins. Risk factors for coronary artery disease include diabetes mellitus, dyslipidemia, hypertension, male sex and a sedentary lifestyle.     Review of Systems  Constitutional: Negative for fatigue and unexpected weight change.  HENT: Negative for dental problem, mouth sores and trouble swallowing.   Eyes: Negative for visual disturbance.  Respiratory: Negative for cough, choking, chest tightness, shortness of breath and wheezing.   Cardiovascular: Negative for chest pain, palpitations and leg swelling.  Gastrointestinal: Negative for abdominal distention, abdominal pain, constipation, diarrhea, nausea and vomiting.  Endocrine: Positive for polyuria. Negative for polydipsia and polyphagia.  Genitourinary: Negative for dysuria, flank pain, hematuria and urgency.  Musculoskeletal: Negative for back pain, gait problem, myalgias and neck pain.  Skin: Negative for pallor, rash and wound.  Neurological: Negative for seizures, syncope, weakness, numbness and headaches.  Psychiatric/Behavioral: Negative.  Negative for confusion and dysphoric mood.    Objective:    BP 134/80   Pulse 77   Wt 245 lb (111.1 kg)   BMI  42.05 kg/m   Wt Readings from Last 3 Encounters:  09/21/16 245 lb (111.1 kg)  08/09/16 243 lb (110.2 kg)  07/06/16 243 lb 12.8 oz (110.6 kg)    Physical Exam  Constitutional: He is oriented to person, place, and time. He appears well-developed and well-nourished. He is cooperative. No distress.  HENT:  Head: Normocephalic and atraumatic.  Eyes: EOM are normal.  Neck: Normal range of motion. Neck supple. No tracheal deviation present. No thyromegaly present.  Cardiovascular: Normal rate, S1 normal, S2 normal and normal heart sounds.  Exam reveals no gallop.   No murmur heard.  Pulses:      Dorsalis pedis pulses are 1+ on the right side, and 1+ on the left side.       Posterior tibial pulses are 1+ on the right side, and 1+ on the left side.  Pulmonary/Chest: Breath sounds normal. No respiratory distress. He has no wheezes.  Abdominal: Soft. Bowel sounds are normal. He exhibits no distension. There is no tenderness. There is no guarding and no CVA tenderness.  Musculoskeletal: He exhibits no edema.       Right shoulder: He exhibits no swelling and no deformity.  Neurological: He is alert and oriented to person, place, and time. He has normal strength and normal reflexes. No cranial nerve deficit or sensory deficit. Gait normal.  Skin: Skin is warm and dry. No rash noted. No cyanosis. Nails show no clubbing.  Psychiatric: He has a normal mood and affect. His speech is normal and behavior is normal. Judgment and thought content normal. Cognition and memory are normal.    Results for orders placed or performed in visit on 09/15/16  Comprehensive metabolic panel  Result Value Ref Range   Sodium 143 135 - 146 mmol/L   Potassium 3.8 3.5 - 5.3 mmol/L   Chloride 107 98 - 110 mmol/L   CO2 24 20 - 31 mmol/L   Glucose, Bld 118 (H) 65 - 99 mg/dL   BUN 52 (H) 7 - 25 mg/dL   Creat 5.08 (H) 0.70 - 1.25 mg/dL   Total Bilirubin 0.3 0.2 - 1.2 mg/dL   Alkaline Phosphatase 49 40 - 115 U/L   AST 14 10  - 35 U/L   ALT 10 9 - 46 U/L   Total Protein 6.5 6.1 - 8.1 g/dL   Albumin 3.6 3.6 - 5.1 g/dL   Calcium 8.9 8.6 - 10.3 mg/dL  Hemoglobin A1c  Result Value Ref Range   Hgb A1c MFr Bld 7.0 (H) <5.7 %   Mean Plasma Glucose 154 mg/dL   Diabetic Labs (most recent): Lab Results  Component Value Date   HGBA1C 7.0 (H) 09/15/2016   HGBA1C 7.0 (H) 06/16/2016   HGBA1C 6.8 (H) 03/13/2016   Lipid Panel     Component Value Date/Time   CHOL 146 06/16/2016 0956   TRIG 190 (H) 06/16/2016 0956   HDL 36 (L) 06/16/2016 0956   CHOLHDL 4.1 06/16/2016 0956   VLDL 38 (H) 06/16/2016 0956   LDLCALC 72 06/16/2016 0956     Assessment & Plan:   1. Type 2 diabetes mellitus with stage 4 chronic kidney disease, with long-term current use of insulin ([facility])  -His  Diabetes is complicated by chronic kidney disease and patient remains at a high risk for more acute and chronic complications of diabetes which include CAD, CVA, CKD, retinopathy, and neuropathy. These are all discussed in detail with the patient.  Patient came with controlled fasting glucose profile, and  recent A1c of   7 % improving from 7.4% last visit.  He did not bring his meter nor logs to review today.  Recent labs reviewed.   - I have re-counseled the patient on diet management and weight loss  by adopting a carbohydrate restricted / protein rich  Diet.  - Suggestion is made for patient to avoid simple carbohydrates   from their diet including Cakes , Desserts, Ice Cream,  Soda (  diet and regular) , Sweet Tea , Candies,  Chips, Cookies, Artificial Sweeteners,   and "Sugar-free" Products .  This will help patient to have  stable blood glucose profile and potentially avoid unintended  Weight gain.  - Patient is advised to stick to a routine mealtimes to eat 3 meals  a day and avoid unnecessary snacks ( to snack only to correct hypoglycemia). I have also advised him to cut back on alcohol consumption.  - The patient  has been  scheduled with  Jearld Fenton, RDN, CDE for individualized DM education.  - I have approached patient with the following individualized plan to manage diabetes and patient agrees.   -He is advised to stay away from ETOH and smoking. He will continue to need at least basal insulin therapy.  -I will continue  Lantus  40 units  qhs, off of Humalog . Continue Tradjenta 5mg  po qday.   He is following with Jearld Fenton, CDE for DM education.  He will be monitoring of BG BID.  -He will continue to f/u with Nephrology given stage 4 CKD ( improving),  even after his DM is controlled.  -Patient is not a candidate for metformin and SGLT2 inhibitors due to CKD. His creatinine increasing to 4, I advised him to stay in close follow-up with nephrology. Currently being considered for fistula placement.  - Patient specific target  for A1c; LDL, HDL, Triglycerides, and  Waist Circumference were discussed in detail.  2) BP/HTN: Controlled. Continue current medications including ACEI/ARB. 3) Lipids/HPL:  continue statins. 4)  Weight/Diet: CDE consult in progress, exercise, and carbohydrates information provided. 5) Vitamin D deficiency - He is status post therapy with vitamin D 50,000 weekly for 12 weeks, currently on vitamin D3 5,000 units daily. His insurance would not cover for repeat vitamin D test.   6) Chronic Care/Health Maintenance:  -Patient is on ACEI/ARB and Statin medications and encouraged to continue to follow up with Ophthalmology, Podiatrist at least yearly or according to recommendations, and advised to quit smoking. I have recommended yearly flu vaccine and pneumonia vaccination at least every 5 years; moderate intensity exercise for up to 150 minutes weekly; and  sleep for at least 7 hours a day.  - 25 minutes of time was spent on the care of this patient , 50% of which was applied for counseling on diabetes complications and their preventions.  - I advised patient to maintain close follow up with  their PCP for primary care needs. - Patient is asked to bring meter and  blood glucose logs during their next visit.   Follow up plan: -Return in about 3 months (around 12/22/2016) for follow up with pre-visit labs, meter, and logs.  Glade Lloyd, MD Phone: [phone]  Fax: [phone]   09/21/2016, 9:48 AM

## 2016-09-25 ENCOUNTER — Encounter: Payer: Self-pay | Admitting: Nephrology

## 2016-09-27 ENCOUNTER — Other Ambulatory Visit (HOSPITAL_COMMUNITY): Payer: Self-pay | Admitting: Pulmonary Disease

## 2016-09-27 NOTE — Addendum Note (Signed)
Addended by: Lianne Cure A on: 09/27/2016 09:54 AM   Modules accepted: Orders

## 2016-09-28 ENCOUNTER — Other Ambulatory Visit (HOSPITAL_COMMUNITY): Payer: Self-pay | Admitting: Pulmonary Disease

## 2016-09-28 ENCOUNTER — Ambulatory Visit: Payer: Self-pay

## 2016-09-28 ENCOUNTER — Other Ambulatory Visit: Payer: Self-pay | Admitting: Occupational Medicine

## 2016-09-28 DIAGNOSIS — R0602 Shortness of breath: Secondary | ICD-10-CM

## 2016-09-28 DIAGNOSIS — Z0000 Encounter for general adult medical examination without abnormal findings: Secondary | ICD-10-CM

## 2016-09-28 DIAGNOSIS — R042 Hemoptysis: Secondary | ICD-10-CM

## 2016-09-29 ENCOUNTER — Ambulatory Visit (HOSPITAL_COMMUNITY)
Admission: RE | Admit: 2016-09-29 | Discharge: 2016-09-29 | Disposition: A | Discharge status: Home / Self Care | Payer: Medicare Other | Source: Ambulatory Visit | Attending: Pulmonary Disease | Admitting: Pulmonary Disease

## 2016-09-29 DIAGNOSIS — R042 Hemoptysis: Secondary | ICD-10-CM | POA: Diagnosis not present

## 2016-09-29 DIAGNOSIS — R918 Other nonspecific abnormal finding of lung field: Secondary | ICD-10-CM | POA: Insufficient documentation

## 2016-09-29 DIAGNOSIS — R0602 Shortness of breath: Secondary | ICD-10-CM | POA: Diagnosis not present

## 2016-09-29 DIAGNOSIS — J40 Bronchitis, not specified as acute or chronic: Secondary | ICD-10-CM | POA: Diagnosis not present

## 2016-09-29 DIAGNOSIS — I700 Atherosclerosis of aorta: Secondary | ICD-10-CM | POA: Insufficient documentation

## 2016-10-02 ENCOUNTER — Other Ambulatory Visit (HOSPITAL_COMMUNITY): Payer: Self-pay | Admitting: Pulmonary Disease

## 2016-10-02 DIAGNOSIS — R0602 Shortness of breath: Secondary | ICD-10-CM

## 2016-10-04 ENCOUNTER — Other Ambulatory Visit (HOSPITAL_COMMUNITY): Payer: Self-pay | Admitting: Pulmonary Disease

## 2016-10-04 DIAGNOSIS — J810 Acute pulmonary edema: Secondary | ICD-10-CM

## 2016-10-06 ENCOUNTER — Ambulatory Visit (HOSPITAL_COMMUNITY)
Admission: RE | Admit: 2016-10-06 | Discharge: 2016-10-06 | Disposition: A | Discharge status: Home / Self Care | Payer: Medicare Other | Source: Ambulatory Visit | Attending: Pulmonary Disease | Admitting: Pulmonary Disease

## 2016-10-06 DIAGNOSIS — I348 Other nonrheumatic mitral valve disorders: Secondary | ICD-10-CM | POA: Diagnosis not present

## 2016-10-06 DIAGNOSIS — J811 Chronic pulmonary edema: Secondary | ICD-10-CM | POA: Insufficient documentation

## 2016-10-06 DIAGNOSIS — I340 Nonrheumatic mitral (valve) insufficiency: Secondary | ICD-10-CM | POA: Insufficient documentation

## 2016-10-06 DIAGNOSIS — I313 Pericardial effusion (noninflammatory): Secondary | ICD-10-CM | POA: Diagnosis not present

## 2016-10-06 DIAGNOSIS — J810 Acute pulmonary edema: Secondary | ICD-10-CM

## 2016-10-06 NOTE — Progress Notes (Signed)
*  PRELIMINARY RESULTS* Echocardiogram 2D Echocardiogram has been performed.  Leavy Cella 10/06/2016, 2:36 PM

## 2016-10-09 DIAGNOSIS — D509 Iron deficiency anemia, unspecified: Secondary | ICD-10-CM | POA: Diagnosis not present

## 2016-10-09 DIAGNOSIS — I10 Essential (primary) hypertension: Secondary | ICD-10-CM | POA: Diagnosis not present

## 2016-10-09 DIAGNOSIS — Z79899 Other long term (current) drug therapy: Secondary | ICD-10-CM | POA: Diagnosis not present

## 2016-10-09 DIAGNOSIS — N183 Chronic kidney disease, stage 3 (moderate): Secondary | ICD-10-CM | POA: Diagnosis not present

## 2016-10-09 DIAGNOSIS — R809 Proteinuria, unspecified: Secondary | ICD-10-CM | POA: Diagnosis not present

## 2016-10-09 DIAGNOSIS — D519 Vitamin B12 deficiency anemia, unspecified: Secondary | ICD-10-CM | POA: Diagnosis not present

## 2016-10-09 DIAGNOSIS — E559 Vitamin D deficiency, unspecified: Secondary | ICD-10-CM | POA: Diagnosis not present

## 2016-10-18 DIAGNOSIS — R809 Proteinuria, unspecified: Secondary | ICD-10-CM | POA: Diagnosis not present

## 2016-10-18 DIAGNOSIS — N185 Chronic kidney disease, stage 5: Secondary | ICD-10-CM | POA: Diagnosis not present

## 2016-10-18 DIAGNOSIS — E1129 Type 2 diabetes mellitus with other diabetic kidney complication: Secondary | ICD-10-CM | POA: Diagnosis not present

## 2016-10-18 DIAGNOSIS — I10 Essential (primary) hypertension: Secondary | ICD-10-CM | POA: Diagnosis not present

## 2016-10-23 ENCOUNTER — Encounter: Payer: Self-pay | Admitting: Vascular Surgery

## 2016-10-27 ENCOUNTER — Encounter: Payer: Self-pay | Admitting: Internal Medicine

## 2016-10-27 ENCOUNTER — Ambulatory Visit (INDEPENDENT_AMBULATORY_CARE_PROVIDER_SITE_OTHER): Payer: Medicare Other | Admitting: Internal Medicine

## 2016-10-27 VITALS — BP 152/88 | HR 77 | Ht 64.0 in | Wt 241.6 lb

## 2016-10-27 DIAGNOSIS — I2510 Atherosclerotic heart disease of native coronary artery without angina pectoris: Secondary | ICD-10-CM | POA: Diagnosis not present

## 2016-10-27 DIAGNOSIS — I10 Essential (primary) hypertension: Secondary | ICD-10-CM | POA: Diagnosis not present

## 2016-10-27 DIAGNOSIS — Z9989 Dependence on other enabling machines and devices: Secondary | ICD-10-CM | POA: Diagnosis not present

## 2016-10-27 DIAGNOSIS — G4733 Obstructive sleep apnea (adult) (pediatric): Secondary | ICD-10-CM | POA: Diagnosis not present

## 2016-10-27 DIAGNOSIS — E782 Mixed hyperlipidemia: Secondary | ICD-10-CM | POA: Diagnosis not present

## 2016-10-27 DIAGNOSIS — N184 Chronic kidney disease, stage 4 (severe): Secondary | ICD-10-CM

## 2016-10-27 NOTE — Progress Notes (Signed)
OFFICE NOTE  Chief Complaint:  Routine follow-up  Primary Care Physician: [name], MD  HPI:  [name] [name] Is a pleasant 66 year old male with a history of insulin-dependent diabetes, dyslipidemia, and hypertension. He's been a diabetic since 1999. He's been on insulin for only 3-4 years. He was a former patient of Dr. Rollene Fare who ordered a stress test in 2014. This demonstrated reversible inferoapical ischemia and was read as moderate risk. He ultimately underwent heart catheterization by Dr. Ellyn Hack on 03/28/2013 which demonstrated a small, possibly minimal aneurysmal bleb in the LAD but no significant disease. There is a dominant RCA with no disease. Essentially there was no significant obstructive coronary disease. At the time his EKG did show inferior T-wave abnormalities and very small R waves but no evidence for Q waves. Today he is referred back for abnormalities EKG which directly compared to his old EKG shows no significant change. There is an IVCD and very small R waves inferiorly, which the computer may have this read as inferior infarct. I do not see any significant change compared to his previous studies. He denies any new or worsening chest pain or shortness of breath.  [name] [name] returns today for follow-up. Overall he is feeling fairly well. He denies any chest pain or shortness of breath. Blood pressure is slightly elevated to 162/94, however recheck was 140/84. Please not been able to lose much weight. He is on chronic Plavix therapy due to what was thought to be plaque rupture but did not require stent by catheterization in 2014. This was a preferred agent over aspirin and he's remained on that, but certainly could come off of that if he were to undergo any procedures he is contemplating a penile implant for erectile dysfunction.  05/01/2016  [name] [name] was seen today in follow-up. He recently saw [name], NP, in April 2016 for follow-up. Blood pressure was  somewhat elevated at the time. It was not is ambulatory due to problems with his knee. There was a discussion about stopping Plavix and keeping him on aspirin however he remained on Plavix. Unfortunately he has stage IV chronic kidney disease and there is discussion about placement of a fistula. He scheduled for upper extremity Dopplers at the end of this month and follow-up with Dr. Oneida Alar. He had a number of questions today regarding dialysis and his fistula, but I reassured him based on his creatinine that he should consider this even if his numbers seem to have stabilized. He denies any chest pain or worsening shortness of breath. He did get new CPAP equipment but reports intermittent compliance with it. He still has some daytime fatigue.  10/27/2016  [name] [name] was seen today in follow-up. He underwent recent placement of a fistula left upper arm. This has a positive thrill today. Blood pressure is elevated somewhat 152/88. Recently his creatinine was increased up to 5.08 which is progression from 3.74 in December. I suspect he will have to start on dialysis soon. Otherwise hemoglobin A1c is stable around 7.0. He is cholesterol also has looked reasonably well controlled with total cholesterol 146, HDL-C 36, LDL-C 72 and triglycerides 190.  PMHx:  Past Medical History:  Diagnosis Date  . Anemia   . Arthritis   . Bell palsy   . CAD (coronary artery disease)    a. 2014 MV: abnl w/ infap ischemia; b. 03/2013 Cath: aneurysmal bleb in the LAD w/ otw nonobs dzs-->Med Rx.  . Chronic back pain   . Chronic knee pain  a. 09/2015 s/p R TKA.  Marland Kitchen Chronic pain   . Chronic shoulder pain   . Chronic sinusitis   . CKD (chronic kidney disease), stage IV ([place])    stage 4 per office visit note of Dr Lowanda Foster on 05/2015   . COPD (chronic obstructive pulmonary disease) ([place])   . Diabetes mellitus without complication ([place])   . Dyspnea   . Essential hypertension   . GERD (gastroesophageal reflux disease)   .  Hepatomegaly    noted on noncontrast CT 2015  . History of hiatal hernia   . Hyperlipidemia   . Lateral meniscus tear   . Obesity    Truncal  . Obstructive sleep apnea    cpap - settings at 4   . On home oxygen therapy    uses 2l when is going somewhere per patient   . Pneumonia   . Post traumatic stress disorder   . PUD (peptic ulcer disease)    remote, reports f/u EGD about 8 years ago unremarkable   . Reactive airway disease    related to exposure to chemical during 9/11  . Sinusitis   . Vitamin D deficiency     Past Surgical History:  Procedure Laterality Date  . ASAD LT SHOULDER  12/2008   left shoulder  . AV FISTULA PLACEMENT Left 08/09/2016   Procedure: BRACHIOCEPHALIC ARTERIOVENOUS (AV) FISTULA CREATION LEFT ARM;  Surgeon: [name], MD;  Location: [facility];  Service: Vascular;  Laterality: Left;  . CATARACT EXTRACTION W/PHACO Left 03/28/2016   Procedure: CATARACT EXTRACTION PHACO AND INTRAOCULAR LENS PLACEMENT LEFT EYE;  Surgeon: [name], MD;  [facility];  Service: Ophthalmology;  Laterality: Left;  CDE: 4.77  . CATARACT EXTRACTION W/PHACO Right 04/11/2016   Procedure: CATARACT EXTRACTION PHACO AND INTRAOCULAR LENS PLACEMENT RIGHT EYE; CDE:  4.74;  Surgeon: [name], MD;  [facility];  Service: Ophthalmology;  Laterality: Right;  . COLONOSCOPY  10/2008   Fields: Rectal polyp obliterated, not retrieved, hemorrhoids, single ascending colon diverticulum near the CV. Next colonoscopy April 2020  . COLONOSCOPY N/A 12/25/2014   SLF: 1. Colorectal polyps (2) removed 2. Small internal hemorrhoids 3. the left colon is severely redundant  . DOPPLER ECHOCARDIOGRAPHY    . ESOPHAGOGASTRODUODENOSCOPY N/A 12/25/2014   SLF: 1. Anemia most likely due to CRI, gastritis, gastric polyps 2. Moderate non-erosive gastriits and mild duodenitis.  3.TWo large gstric polyps removed.   Marland Kitchen EYE SURGERY  12/22/2010   tear duct probing-[place]  . FOREIGN BODY REMOVAL  03/29/2011    Procedure: REMOVAL FOREIGN BODY EXTREMITY;  Surgeon: Arther Abbott, MD;  [facility]: [facility];  Service: Orthopedics;  Laterality: Right;  Removal Foreign Body Right Thumb  . KNEE ARTHROSCOPY  10/2007   left  . KNEE ARTHROSCOPY WITH LATERAL MENISECTOMY Right 10/14/2015   Procedure: LEFT KNEE ARTHROSCOPY WITH PARTIAL LATERAL MENISECTOMY;  Surgeon: Carole Civil, MD;  [facility];  Service: Orthopedics;  Laterality: Right;  . LEFT HEART CATHETERIZATION WITH CORONARY ANGIOGRAM N/A 03/28/2013   Procedure: LEFT HEART CATHETERIZATION WITH CORONARY ANGIOGRAM;  Surgeon: Leonie Man, MD;  [facility];  Service: Cardiovascular;  Laterality: N/A;  . NM MYOVIEW LTD    . PENILE PROSTHESIS IMPLANT N/A 08/16/2015   Procedure: PENILE PROTHESIS INFLATABLE, three piece, Excisional biopsy of Penile ulcer, Penile molding;  Surgeon: Carolan Clines, MD;  [facility];  Service: Urology;  Laterality: N/A;  . QUADRICEPS TENDON REPAIR  07/21/2011   Procedure: REPAIR QUADRICEP TENDON;  Surgeon: Arther Abbott, MD;  [facility];  Service: Orthopedics;  Laterality: Right;  . TOENAIL EXCISION     removed [id number]  . UMBILICAL HERNIA REPAIR  2007   [place]    FAMHx:  Family History  Problem Relation Age of Onset  . Hypertension Mother     MI  . Cancer Mother     breast   . Diabetes Mother   . Diabetes Father   . Hypertension Father   . Hypertension Sister   . Diabetes Sister   . Arthritis    . Asthma    . Lung disease    . Anesthesia problems Neg Hx   . Hypotension Neg Hx   . Malignant hyperthermia Neg Hx   . Pseudochol deficiency Neg Hx   . Colon cancer Neg Hx     SOCHx:   reports that he quit smoking about 6 years ago. His smoking use included Cigarettes. He has a 25.00 pack-year smoking history. He has never used smokeless tobacco. He reports that he drinks alcohol. He reports that he uses drugs, including Cocaine and Marijuana.  ALLERGIES:  Allergies  Allergen  Reactions  . Opana [Oxymorphone Hcl] Itching  . Tramadol Itching    ROS: A comprehensive review of systems was negative.  HOME MEDS: Current Outpatient Prescriptions  Medication Sig Dispense Refill  . amLODipine (NORVASC) 10 MG tablet Take 10 mg by mouth daily.     Marland Kitchen aspirin EC 81 MG tablet Take 1 tablet (81 mg total) by mouth daily. 90 tablet 3  . budesonide-formoterol (SYMBICORT) 160-4.5 MCG/ACT inhaler Inhale 2 puffs into the lungs 2 (two) times daily as needed (Shortness of Breath).     . cetirizine (ZYRTEC) 10 MG tablet Take 10 mg by mouth at bedtime as needed for allergies.     . Cholecalciferol (VITAMIN D) 2000 units tablet Take 2,000 Units by mouth daily.    . furosemide (LASIX) 40 MG tablet Take 1 tablet by mouth daily.    . hydrALAZINE (APRESOLINE) 25 MG tablet Take 25 mg by mouth 3 (three) times daily.    . Insulin Glargine (LANTUS SOLOSTAR) 100 UNIT/ML Solostar Pen INJECT 40 UNITS SUBCUTANEOUSLY AT BEDTIME. 45 mL 0  . iron polysaccharides (NIFEREX) 150 MG capsule Take 150 mg by mouth 2 (two) times daily.     . mometasone (NASONEX) 50 MCG/ACT nasal spray Place 2 sprays into the nose 2 (two) times daily as needed (allergies).     . pantoprazole (PROTONIX) 40 MG tablet 1 PO 30 MINS PRIOR TO MEALS BID (Patient taking differently: Take 40 mg by mouth 2 (two) times daily before a meal. ) 180 tablet 3  . simvastatin (ZOCOR) 20 MG tablet Take 20 mg by mouth every morning.     . TRADJENTA 5 MG TABS tablet TAKE 1 TABLET EVERY DAY 90 tablet 0   No current facility-administered medications for this visit.     LABS/IMAGING: No results found for this or any previous visit (from the past 48 hour(s)). No results found.  VITALS: BP (!) 152/88   Pulse 77   Ht 5\' 4"  (1.626 m)   Wt 241 lb 9.6 oz (109.6 kg)   BMI 41.47 kg/m   EXAM: General appearance: alert and no distress Neck: no carotid bruit and no JVD Lungs: clear to auscultation bilaterally Heart: regular rate and rhythm, S1,  S2 normal, no murmur, click, rub or gallop Abdomen: soft, non-tender; bowel sounds normal; no masses,  no organomegaly and morbidly obse  Extremities: extremities normal, atraumatic, no cyanosis or edema Pulses: 2+ and symmetric Skin: Skin color, texture, turgor normal. No rashes or lesions Neurologic: Grossly normal Psyvh: Normal  EKG: Sinus rhythm with first-degree AV block at 77, nonspecific T wave changes  ASSESSMENT: 1. Mild coronary artery disease by cath in September 2014, question plaque rupture-on Plavix (no stent) 2. Insulin-dependent diabetes 3. Dyslipidemia-controlled 4. Hypertension 5. Morbid obesity 6. Erectile dysfunction - s/p implant 7. OSA on CPAP 8. ESRD - s/p LUE AV fistula, not yet on dialysis  PLAN: 1.   [name] [name] does not have any acute anginal symptoms. His diabetes is fairly well-controlled with hemoglobin A1c of 7.0. Cholesterol is at goal. Blood pressure is slightly elevated although I suspect that dialysis may solve this problem. Creatinine continues to rise and he will likely start soon.  Follow-up in 1 year.  Pixie Casino, MD, [facility] Attending Cardiologist Brodnax C Hilty 10/27/2016, 12:00 PM

## 2016-10-27 NOTE — Patient Instructions (Signed)
Your physician recommends that you schedule a follow-up appointment in: 1 year with Dr. Debara Pickett

## 2016-11-02 ENCOUNTER — Ambulatory Visit (INDEPENDENT_AMBULATORY_CARE_PROVIDER_SITE_OTHER): Payer: Self-pay | Admitting: Vascular Surgery

## 2016-11-02 ENCOUNTER — Encounter: Payer: Self-pay | Admitting: Vascular Surgery

## 2016-11-02 ENCOUNTER — Ambulatory Visit (HOSPITAL_COMMUNITY)
Admission: RE | Admit: 2016-11-02 | Discharge: 2016-11-02 | Disposition: A | Discharge status: Home / Self Care | Payer: Medicare Other | Source: Ambulatory Visit | Attending: Vascular Surgery | Admitting: Vascular Surgery

## 2016-11-02 VITALS — BP 135/81 | HR 78 | Temp 98.2°F | Resp 20 | Ht 64.0 in | Wt 245.0 lb

## 2016-11-02 DIAGNOSIS — N184 Chronic kidney disease, stage 4 (severe): Secondary | ICD-10-CM | POA: Diagnosis not present

## 2016-11-02 NOTE — Progress Notes (Signed)
Patient is a 66 year old male who returns for follow-up today after creation of a left brachiocephalic AV fistula created in January 24th 2018. He is not currently on hemodialysis. He denies any numbness or tingling in his hand. He has follow-up scheduled with Dr Hinda Lenis in a few weeks.  Review of systems: He denies any shortness of breath. He denies any chest pain.  Physical exam:  Vitals:   11/02/16 1541  BP: 135/81  Pulse: 78  Resp: 20  Temp: 98.2 F (36.8 C)  SpO2: 95%  Weight: 245 lb (111.1 kg)  Height: 5\' 4"  (1.626 m)    Left upper extremity: Palpable thrill in the fistula fistula is palpable throughout most of its course in the left upper arm. Left hand is pink and warm.  Data: Patient is a duplex ultrasound of his fistula today which showed the diameter is 5-6 mm. Depth is less than 4 mm throughout most of its course.  Assessment: Maturing AV fistula left arm.  Plan: The fistula should be ready for cannulation if needed. Otherwise the patient will continue to exercise the fistula and follow-up on as-needed basis.  Ruta Hinds, MD [facility] Office: [phone] Pager: [phone]

## 2016-11-14 ENCOUNTER — Encounter: Payer: Self-pay | Admitting: Gastroenterology

## 2016-11-17 DIAGNOSIS — I10 Essential (primary) hypertension: Secondary | ICD-10-CM | POA: Diagnosis not present

## 2016-11-17 DIAGNOSIS — N184 Chronic kidney disease, stage 4 (severe): Secondary | ICD-10-CM | POA: Diagnosis not present

## 2016-11-17 DIAGNOSIS — D509 Iron deficiency anemia, unspecified: Secondary | ICD-10-CM | POA: Diagnosis not present

## 2016-11-17 DIAGNOSIS — E1129 Type 2 diabetes mellitus with other diabetic kidney complication: Secondary | ICD-10-CM | POA: Diagnosis not present

## 2016-11-17 DIAGNOSIS — D519 Vitamin B12 deficiency anemia, unspecified: Secondary | ICD-10-CM | POA: Diagnosis not present

## 2016-11-17 DIAGNOSIS — R809 Proteinuria, unspecified: Secondary | ICD-10-CM | POA: Diagnosis not present

## 2016-11-21 DIAGNOSIS — E1129 Type 2 diabetes mellitus with other diabetic kidney complication: Secondary | ICD-10-CM | POA: Diagnosis not present

## 2016-11-21 DIAGNOSIS — I10 Essential (primary) hypertension: Secondary | ICD-10-CM | POA: Diagnosis not present

## 2016-11-21 DIAGNOSIS — R809 Proteinuria, unspecified: Secondary | ICD-10-CM | POA: Diagnosis not present

## 2016-11-21 DIAGNOSIS — N185 Chronic kidney disease, stage 5: Secondary | ICD-10-CM | POA: Diagnosis not present

## 2016-11-21 DIAGNOSIS — D649 Anemia, unspecified: Secondary | ICD-10-CM | POA: Diagnosis not present

## 2016-11-21 DIAGNOSIS — G4733 Obstructive sleep apnea (adult) (pediatric): Secondary | ICD-10-CM | POA: Diagnosis not present

## 2016-12-05 DIAGNOSIS — B351 Tinea unguium: Secondary | ICD-10-CM | POA: Diagnosis not present

## 2016-12-05 DIAGNOSIS — E1142 Type 2 diabetes mellitus with diabetic polyneuropathy: Secondary | ICD-10-CM | POA: Diagnosis not present

## 2016-12-14 ENCOUNTER — Encounter: Payer: Self-pay | Admitting: Gastroenterology

## 2016-12-14 ENCOUNTER — Ambulatory Visit (INDEPENDENT_AMBULATORY_CARE_PROVIDER_SITE_OTHER): Payer: Medicare Other | Admitting: Gastroenterology

## 2016-12-14 ENCOUNTER — Other Ambulatory Visit: Payer: Self-pay

## 2016-12-14 DIAGNOSIS — I2510 Atherosclerotic heart disease of native coronary artery without angina pectoris: Secondary | ICD-10-CM | POA: Diagnosis not present

## 2016-12-14 DIAGNOSIS — K760 Fatty (change of) liver, not elsewhere classified: Secondary | ICD-10-CM | POA: Diagnosis not present

## 2016-12-14 DIAGNOSIS — R8290 Unspecified abnormal findings in urine: Secondary | ICD-10-CM

## 2016-12-14 DIAGNOSIS — K640 First degree hemorrhoids: Secondary | ICD-10-CM

## 2016-12-14 DIAGNOSIS — K219 Gastro-esophageal reflux disease without esophagitis: Secondary | ICD-10-CM

## 2016-12-14 DIAGNOSIS — N399 Disorder of urinary system, unspecified: Secondary | ICD-10-CM

## 2016-12-14 NOTE — Progress Notes (Signed)
cc'ed to pcp °

## 2016-12-14 NOTE — Assessment & Plan Note (Signed)
WEIGHT DOWN .  DRINK WATER TO KEEP YOUR URINE LIGHT YELLOW.  CONTINUE YOUR WEIGHT LOSS EFFORTS. FOLLOW A HIGH FIBER/DIABETIC DIET. AVOID ITEMS THAT CAUSE BLOATING & GAS. YOU SHOULD TRANSITION TO A PLANT BASED DIET-NO MEAT OR DAIRY FOR 6 MOS. I RECOMMEND THE BOOK, "PREVENT AND REVERSE HEART DISEASE". CALDWELL ESSELSTYN JR., MD. PAGE 120-121 CLEARLY STATE THE RULES AND QUICK AND EASY RECIPES FOR BREAKFAST, LUNCH, AND DINNER ARE AFTER P 127.  FOLLOW UP IN 6 MOS.

## 2016-12-14 NOTE — Patient Instructions (Addendum)
DRINK WATER TO KEEP YOUR URINE LIGHT YELLOW.  CONTINUE YOUR WEIGHT LOSS EFFORTS.  FOLLOW A HIGH FIBER/DIABETIC DIET. AVOID ITEMS THAT CAUSE BLOATING & GAS. YOU SHOULD TRANSITION TO A PLANT BASED DIET-NO MEAT OR DAIRY FOR 6 MOS. I RECOMMEND THE BOOK, "PREVENT AND REVERSE HEART DISEASE". CALDWELL ESSELSTYN JR., MD. PAGE 120-121 CLEARLY STATE THE RULES AND QUICK AND EASY RECIPES FOR BREAKFAST, LUNCH, AND DINNER ARE AFTER P 127.  AVOID REFLUX TRIGGERS. SEE INFO BELOW.  CONTINUE PROTONIX. TAKE 30 MINUTES PRIOR TO MEALS ONCE OR TWICE DAILY.  FOLLOW UP IN 6 MOS.    Lifestyle and home remedies TO CONTROL HEARTBURN/REFLUX  You may eliminate or reduce the frequency of heartburn by making the following lifestyle changes:  . Control your weight. Being overweight is a major risk factor for heartburn and GERD. Excess pounds put pressure on your abdomen, pushing up your stomach and causing acid to back up into your esophagus.   . Eat smaller meals. 4 TO 6 MEALS A DAY. This reduces pressure on the lower esophageal sphincter, helping to prevent the valve from opening and acid from washing back into your esophagus.   Dolphus Jenny your belt. Clothes that fit tightly around your waist put pressure on your abdomen and the lower esophageal sphincter.   . Eliminate heartburn triggers. Everyone has specific triggers. Common triggers such as fatty or fried foods, spicy food, tomato sauce, carbonated beverages, alcohol, chocolate, mint, garlic, onion, caffeine and nicotine may make heartburn worse.   Marland Kitchen Avoid stooping or bending. Tying your shoes is OK. Bending over for longer periods to weed your garden isn't, especially soon after eating.   . Don't lie down after a meal. Wait at least three to four hours after eating before going to bed, and don't lie down right after eating.    Alternative medicine . Several home remedies exist for treating GERD, but they provide only temporary relief. They include drinking  baking soda (sodium bicarbonate) added to water or drinking other fluids such as baking soda mixed with cream of tartar and water.  . Although these liquids create temporary relief by neutralizing, washing away or buffering acids, eventually they aggravate the situation by adding gas and fluid to your stomach, increasing pressure and causing more acid reflux. Further, adding more sodium to your diet may increase your blood pressure and add stress to your heart, and excessive bicarbonate ingestion can alter the acid-base balance in your body.    Marland Kitchen

## 2016-12-14 NOTE — Assessment & Plan Note (Signed)
SYMPTOMS CONTROLLED/RESOLVED.  CONTINUE TO MONITOR SYMPTOMS. 

## 2016-12-14 NOTE — Assessment & Plan Note (Signed)
WEIGHT DOWN 2 LBS BUT BMI > 40.  DRINK WATER TO KEEP YOUR URINE LIGHT YELLOW. CONTINUE YOUR WEIGHT LOSS EFFORTS. FOLLOW A HIGH FIBER/DIABETIC DIET. AVOID ITEMS THAT CAUSE BLOATING & GAS. YOU SHOULD TRANSITION TO A PLANT BASED DIET-NO MEAT OR DAIRY FOR 6 MOS. I RECOMMEND THE BOOK, "PREVENT AND REVERSE HEART DISEASE". CALDWELL ESSELSTYN JR., MD. PAGE 120-121 CLEARLY STATE THE RULES AND QUICK AND EASY RECIPES FOR BREAKFAST, LUNCH, AND DINNER ARE AFTER P 127.  FOLLOW UP IN 6 MOS.

## 2016-12-14 NOTE — Progress Notes (Signed)
ON RECALL  °

## 2016-12-14 NOTE — Assessment & Plan Note (Signed)
SYMPTOMS CONTROLLED/RESOLVED.  DRINK WATER TO KEEP YOUR URINE LIGHT YELLOW. CONTINUE YOUR WEIGHT LOSS EFFORTS. AVOID REFLUX TRIGGERS.  HANDOUT GIVEN. CONTINUE PROTONIX. TAKE 30 MINUTES PRIOR TO MEALS ONCE OR TWICE DAILY.  FOLLOW UP IN 6 MOS.

## 2016-12-14 NOTE — Progress Notes (Signed)
Subjective:    Patient ID: [name] [name], male    DOB: 06/13/1951, 66 y.o.   MRN: [id number] [name], MD   HPI URINE HAS A STRONG SMELL. NO DYSURIA OR HEMATURIA. BMS: GOOD, EVERY DAY. LOST 2 LBS. HEARTBURN: GOOD.    PT DENIES FEVER, CHILLS, HEMATOCHEZIA, nausea, vomiting, melena, diarrhea, CHEST PAIN, SHORTNESS OF BREATH,  CHANGE IN BOWEL IN HABITS, constipation, abdominal pain, problems swallowing, OR heartburn or indigestion.  Past Medical History:  Diagnosis Date  . Anemia   . Arthritis   . Bell palsy   . CAD (coronary artery disease)    a. 2014 MV: abnl w/ infap ischemia; b. 03/2013 Cath: aneurysmal bleb in the LAD w/ otw nonobs dzs-->Med Rx.  . Chronic back pain   . Chronic knee pain    a. 09/2015 s/p R TKA.  Marland Kitchen Chronic pain   . Chronic shoulder pain   . Chronic sinusitis   . CKD (chronic kidney disease), stage IV ([place])    stage 4 per office visit note of Dr Lowanda Foster on 05/2015   . COPD (chronic obstructive pulmonary disease) ([place])   . Diabetes mellitus without complication ([place])   . Dyspnea   . Essential hypertension   . GERD (gastroesophageal reflux disease)   . Hepatomegaly    noted on noncontrast CT 2015  . History of hiatal hernia   . Hyperlipidemia   . Lateral meniscus tear   . Obesity    Truncal  . Obstructive sleep apnea    cpap - settings at 4   . On home oxygen therapy    uses 2l when is going somewhere per patient   . Pneumonia   . Post traumatic stress disorder   . PUD (peptic ulcer disease)    remote, reports f/u EGD about 8 years ago unremarkable   . Reactive airway disease    related to exposure to chemical during 9/11  . Sinusitis   . Vitamin D deficiency    Past Surgical History:  Procedure Laterality Date  . ASAD LT SHOULDER  12/2008   left shoulder  . AV FISTULA PLACEMENT Left 08/09/2016   Procedure: BRACHIOCEPHALIC ARTERIOVENOUS (AV) FISTULA CREATION LEFT ARM;  Surgeon: [name], MD;  Location: [place];  Service: Vascular;   Laterality: Left;  . CATARACT EXTRACTION W/PHACO Left 03/28/2016   Procedure: CATARACT EXTRACTION PHACO AND INTRAOCULAR LENS PLACEMENT LEFT EYE;  Surgeon: [name], MD;  [facility];  Service: Ophthalmology;  Laterality: Left;  CDE: 4.77  . CATARACT EXTRACTION W/PHACO Right 04/11/2016   Procedure: CATARACT EXTRACTION PHACO AND INTRAOCULAR LENS PLACEMENT RIGHT EYE; CDE:  4.74;  Surgeon: [name], MD;  [facility];  Service: Ophthalmology;  Laterality: Right;  . COLONOSCOPY  10/2008   Khayden Herzberg: Rectal polyp obliterated, not retrieved, hemorrhoids, single ascending colon diverticulum near the CV. Next colonoscopy April 2020  . COLONOSCOPY N/A 12/25/2014   SLF: 1. Colorectal polyps (2) removed 2. Small internal hemorrhoids 3. the left colon is severely redundant  . DOPPLER ECHOCARDIOGRAPHY    . ESOPHAGOGASTRODUODENOSCOPY N/A 12/25/2014   SLF: 1. Anemia most likely due to CRI, gastritis, gastric polyps 2. Moderate non-erosive gastriits and mild duodenitis.  3.TWo large gstric polyps removed.   Marland Kitchen EYE SURGERY  12/22/2010   tear duct probing-[place]  . FOREIGN BODY REMOVAL  03/29/2011   Procedure: REMOVAL FOREIGN BODY EXTREMITY;  Surgeon: Arther Abbott, MD;  [facility];  Service: Orthopedics;  Laterality: Right;  Removal Foreign Body  Right Thumb  . KNEE ARTHROSCOPY  10/2007   left  . KNEE ARTHROSCOPY WITH LATERAL MENISECTOMY Right 10/14/2015   Procedure: LEFT KNEE ARTHROSCOPY WITH PARTIAL LATERAL MENISECTOMY;  Surgeon: Carole Civil, MD;  [facility];  Service: Orthopedics;  Laterality: Right;  . LEFT HEART CATHETERIZATION WITH CORONARY ANGIOGRAM N/A 03/28/2013   Procedure: LEFT HEART CATHETERIZATION WITH CORONARY ANGIOGRAM;  Surgeon: Leonie Man, MD;  Location: [facility] CATH LAB;  Service: Cardiovascular;  Laterality: N/A;  . NM MYOVIEW LTD    . PENILE PROSTHESIS IMPLANT N/A 08/16/2015   Procedure: PENILE PROTHESIS INFLATABLE, three piece, Excisional biopsy of Penile ulcer,  Penile molding;  Surgeon: Carolan Clines, MD;  [facility];  Service: Urology;  Laterality: N/A;  . QUADRICEPS TENDON REPAIR  07/21/2011   Procedure: REPAIR QUADRICEP TENDON;  Surgeon: Arther Abbott, MD;  [facility];  Service: Orthopedics;  Laterality: Right;  . TOENAIL EXCISION     removed [id number]  . UMBILICAL HERNIA REPAIR  2007   [place]   Allergies  Allergen Reactions  . Opana [Oxymorphone Hcl] Itching  . Tramadol Itching   Current Outpatient Prescriptions  Medication Sig Dispense Refill  . amLODipine (NORVASC) 10 MG tablet Take 10 mg by mouth daily.     Marland Kitchen aspirin EC 81 MG tablet Take 1 tablet (81 mg total) by mouth daily.    . budesonide-formoterol (SYMBICORT) 160-4.5 MCG/ACT inhaler Inhale 2 puffs into the lungs 2 (two) times daily as needed (Shortness of Breath).     . cetirizine (ZYRTEC) 10 MG tablet Take 10 mg by mouth at bedtime as needed for allergies.     . Cholecalciferol (VITAMIN D) 2000 units tablet Take 2,000 Units by mouth daily.    . furosemide (LASIX) 40 MG tablet Take 1 tablet by mouth daily.    . hydrALAZINE (APRESOLINE) 25 MG tablet Take 25 mg by mouth 3 (three) times daily.    . Insulin Glargine (LANTUS SOLOSTAR) 100 UNIT/ML Solostar Pen INJECT 40 UNITS SUBCUTANEOUSLY AT BEDTIME.    . iron polysaccharides (NIFEREX) 150 MG capsule Take 150 mg by mouth 2 (two) times daily.     . mometasone (NASONEX) 50 MCG/ACT nasal spray Place 2 sprays into the nose 2 (two) times daily as needed (allergies).     . pantoprazole (PROTONIX) 40 MG tablet 1 PO 30 MINS PRIOR TO MEALS BID     . simvastatin (ZOCOR) 20 MG tablet Take 20 mg by mouth every morning.     . TRADJENTA 5 MG TABS tablet TAKE 1 TABLET EVERY DAY     Review of Systems PER HPI OTHERWISE ALL SYSTEMS ARE NEGATIVE.    Objective:   Physical Exam  Constitutional: He is oriented to person, place, and time. He appears well-developed and well-nourished. No distress.  HENT:  Head: Normocephalic and  atraumatic.  Mouth/Throat: Oropharynx is clear and moist. No oropharyngeal exudate.  Eyes: Pupils are equal, round, and reactive to light. No scleral icterus.  Neck: Normal range of motion. Neck supple.  Cardiovascular: Normal rate, regular rhythm and normal heart sounds.   Pulmonary/Chest: Effort normal and breath sounds normal. No respiratory distress.  Abdominal: Soft. Bowel sounds are normal. He exhibits no distension. There is no tenderness.  Musculoskeletal: He exhibits no edema.  Lymphadenopathy:    He has no cervical adenopathy.  Neurological: He is alert and oriented to person, place, and time.  Psychiatric: He has a normal mood and affect.  Vitals reviewed.     Assessment &  Plan:

## 2016-12-14 NOTE — Assessment & Plan Note (Signed)
NO HEMATURIA OR DYSURIA.  UA TODAY. UROLOGY REFERRAL IN 3-4 WEEKS.

## 2016-12-19 DIAGNOSIS — C61 Malignant neoplasm of prostate: Secondary | ICD-10-CM | POA: Diagnosis not present

## 2016-12-19 DIAGNOSIS — D291 Benign neoplasm of prostate: Secondary | ICD-10-CM | POA: Diagnosis not present

## 2016-12-19 DIAGNOSIS — E119 Type 2 diabetes mellitus without complications: Secondary | ICD-10-CM | POA: Diagnosis not present

## 2016-12-19 DIAGNOSIS — E1129 Type 2 diabetes mellitus with other diabetic kidney complication: Secondary | ICD-10-CM | POA: Diagnosis not present

## 2016-12-19 DIAGNOSIS — I10 Essential (primary) hypertension: Secondary | ICD-10-CM | POA: Diagnosis not present

## 2016-12-19 DIAGNOSIS — E1165 Type 2 diabetes mellitus with hyperglycemia: Secondary | ICD-10-CM | POA: Diagnosis not present

## 2016-12-19 DIAGNOSIS — Z1389 Encounter for screening for other disorder: Secondary | ICD-10-CM | POA: Diagnosis not present

## 2016-12-19 DIAGNOSIS — Z0000 Encounter for general adult medical examination without abnormal findings: Secondary | ICD-10-CM | POA: Diagnosis not present

## 2016-12-19 DIAGNOSIS — N184 Chronic kidney disease, stage 4 (severe): Secondary | ICD-10-CM | POA: Diagnosis not present

## 2016-12-19 LAB — BASIC METABOLIC PANEL
BUN: 43 — AB (ref 4–21)
Creatinine: 4.8 — AB (ref <=1.3)

## 2016-12-19 LAB — HEMOGLOBIN A1C: Hemoglobin A1C: 6.4

## 2016-12-22 DIAGNOSIS — R809 Proteinuria, unspecified: Secondary | ICD-10-CM | POA: Diagnosis not present

## 2016-12-22 DIAGNOSIS — N184 Chronic kidney disease, stage 4 (severe): Secondary | ICD-10-CM | POA: Diagnosis not present

## 2016-12-22 DIAGNOSIS — D519 Vitamin B12 deficiency anemia, unspecified: Secondary | ICD-10-CM | POA: Diagnosis not present

## 2016-12-22 DIAGNOSIS — D509 Iron deficiency anemia, unspecified: Secondary | ICD-10-CM | POA: Diagnosis not present

## 2016-12-22 DIAGNOSIS — E1129 Type 2 diabetes mellitus with other diabetic kidney complication: Secondary | ICD-10-CM | POA: Diagnosis not present

## 2016-12-25 ENCOUNTER — Ambulatory Visit: Payer: Medicare Other | Admitting: "Endocrinology

## 2016-12-26 ENCOUNTER — Telehealth: Payer: Self-pay

## 2016-12-26 NOTE — Telephone Encounter (Signed)
[facility] to f/u on referral. Appt scheduled for 01/26/17 at 3:30pm at [facility] office. Tried to call pt, informed his wife of appt. She said he has other appts that day. Gave her phone number to [facility] so appt could be rescheduled.

## 2016-12-27 ENCOUNTER — Ambulatory Visit (INDEPENDENT_AMBULATORY_CARE_PROVIDER_SITE_OTHER): Payer: Medicare Other | Admitting: "Endocrinology

## 2016-12-27 ENCOUNTER — Encounter: Payer: Self-pay | Admitting: "Endocrinology

## 2016-12-27 VITALS — BP 142/84 | HR 72 | Ht 64.0 in | Wt 242.0 lb

## 2016-12-27 DIAGNOSIS — E1122 Type 2 diabetes mellitus with diabetic chronic kidney disease: Secondary | ICD-10-CM

## 2016-12-27 DIAGNOSIS — N184 Chronic kidney disease, stage 4 (severe): Secondary | ICD-10-CM | POA: Diagnosis not present

## 2016-12-27 DIAGNOSIS — E559 Vitamin D deficiency, unspecified: Secondary | ICD-10-CM | POA: Diagnosis not present

## 2016-12-27 DIAGNOSIS — N250 Renal osteodystrophy: Secondary | ICD-10-CM | POA: Diagnosis not present

## 2016-12-27 DIAGNOSIS — E1129 Type 2 diabetes mellitus with other diabetic kidney complication: Secondary | ICD-10-CM | POA: Diagnosis not present

## 2016-12-27 DIAGNOSIS — R809 Proteinuria, unspecified: Secondary | ICD-10-CM | POA: Diagnosis not present

## 2016-12-27 DIAGNOSIS — Z794 Long term (current) use of insulin: Secondary | ICD-10-CM

## 2016-12-27 DIAGNOSIS — I10 Essential (primary) hypertension: Secondary | ICD-10-CM

## 2016-12-27 DIAGNOSIS — E782 Mixed hyperlipidemia: Secondary | ICD-10-CM | POA: Diagnosis not present

## 2016-12-27 DIAGNOSIS — I2510 Atherosclerotic heart disease of native coronary artery without angina pectoris: Secondary | ICD-10-CM | POA: Diagnosis not present

## 2016-12-27 DIAGNOSIS — N185 Chronic kidney disease, stage 5: Secondary | ICD-10-CM | POA: Diagnosis not present

## 2016-12-27 NOTE — Progress Notes (Signed)
Subjective:    Patient ID: [name] [name], male    DOB: [birth year], PCP [name], MD   Past Medical History:  Diagnosis Date  . Anemia   . Arthritis   . Bell palsy   . CAD (coronary artery disease)    a. 2014 MV: abnl w/ infap ischemia; b. 03/2013 Cath: aneurysmal bleb in the LAD w/ otw nonobs dzs-->Med Rx.  . Chronic back pain   . Chronic knee pain    a. 09/2015 s/p R TKA.  Marland Kitchen Chronic pain   . Chronic shoulder pain   . Chronic sinusitis   . CKD (chronic kidney disease), stage IV ([place])    stage 4 per office visit note of Dr Lowanda Foster on 05/2015   . COPD (chronic obstructive pulmonary disease) ([place])   . Diabetes mellitus without complication ([place])   . Dyspnea   . Essential hypertension   . GERD (gastroesophageal reflux disease)   . Hepatomegaly    noted on noncontrast CT 2015  . History of hiatal hernia   . Hyperlipidemia   . Lateral meniscus tear   . Obesity    [place]  . Obstructive sleep apnea    cpap - settings at 4   . On home oxygen therapy    uses 2l when is going somewhere per patient   . Pneumonia   . Post traumatic stress disorder   . PUD (peptic ulcer disease)    remote, reports f/u EGD about 8 years ago unremarkable   . Reactive airway disease    related to exposure to chemical during 9/11  . Sinusitis   . Vitamin D deficiency    Past Surgical History:  Procedure Laterality Date  . ASAD LT SHOULDER  12/2008   left shoulder  . AV FISTULA PLACEMENT Left 08/09/2016   Procedure: BRACHIOCEPHALIC ARTERIOVENOUS (AV) FISTULA CREATION LEFT ARM;  Surgeon: [name], MD;  Location: [facility];  Service: Vascular;  Laterality: Left;  . CATARACT EXTRACTION W/PHACO Left 03/28/2016   Procedure: CATARACT EXTRACTION PHACO AND INTRAOCULAR LENS PLACEMENT LEFT EYE;  Surgeon: [name], MD;  Location: [facility];  Service: Ophthalmology;  Laterality: Left;  CDE: 4.77  . CATARACT EXTRACTION W/PHACO Right 04/11/2016   Procedure: CATARACT EXTRACTION PHACO AND INTRAOCULAR  LENS PLACEMENT RIGHT EYE; CDE:  4.74;  Surgeon: [name], MD;  Location: [facility];  Service: Ophthalmology;  Laterality: Right;  . COLONOSCOPY  10/2008   Fields: Rectal polyp obliterated, not retrieved, hemorrhoids, single ascending colon diverticulum near the CV. Next colonoscopy April 2020  . COLONOSCOPY N/A 12/25/2014   SLF: 1. Colorectal polyps (2) removed 2. Small internal hemorrhoids 3. the left colon is severely redundant  . DOPPLER ECHOCARDIOGRAPHY    . ESOPHAGOGASTRODUODENOSCOPY N/A 12/25/2014   SLF: 1. Anemia most likely due to CRI, gastritis, gastric polyps 2. Moderate non-erosive gastriits and mild duodenitis.  3.TWo large gstric polyps removed.   Marland Kitchen EYE SURGERY  12/22/2010   tear duct probing-[facility]  . FOREIGN BODY REMOVAL  03/29/2011   Procedure: REMOVAL FOREIGN BODY EXTREMITY;  Surgeon: Arther Abbott, MD;  Location: [facility];  Service: Orthopedics;  Laterality: Right;  Removal Foreign Body Right Thumb  . KNEE ARTHROSCOPY  10/2007   left  . KNEE ARTHROSCOPY WITH LATERAL MENISECTOMY Right 10/14/2015   Procedure: LEFT KNEE ARTHROSCOPY WITH PARTIAL LATERAL MENISECTOMY;  Surgeon: Carole Civil, MD;  Location: [facility];  Service: Orthopedics;  Laterality: Right;  . LEFT HEART CATHETERIZATION WITH CORONARY ANGIOGRAM N/A 03/28/2013  Procedure: LEFT HEART CATHETERIZATION WITH CORONARY ANGIOGRAM;  Surgeon: Leonie Man, MD;  Location: [facility] C[facility];  Service: Cardiovascular;  Laterality: N/A;  . NM MYOVIEW LTD    . PENILE PROSTHESIS IMPLANT N/A 08/16/2015   Procedure: PENILE PROTHESIS INFLATABLE, three piece, Excisional biopsy of Penile ulcer, Penile molding;  Surgeon: Carolan Clines, MD;  Location: [facility];  Service: Urology;  Laterality: N/A;  . QUADRICEPS TENDON REPAIR  07/21/2011   Procedure: REPAIR QUADRICEP TENDON;  Surgeon: Arther Abbott, MD;  Location: [facility];  Service: Orthopedics;  Laterality: Right;  . TOENAIL EXCISION     removed [id number]  . UMBILICAL HERNIA REPAIR   2007   [place]   Social History   Social History  . Marital status: Married    Spouse name: N/A  . Number of children: 2  . Years of education: 12th grade   Occupational History  . disabled   .     Marland Kitchen  Unemployed   Social History Main Topics  . Smoking status: Former Smoker    Packs/day: 1.00    Years: 25.00    Types: Cigarettes    Quit date: 03/27/2010  . Smokeless tobacco: Never Used     Comment: Quit x 7 years  . Alcohol use 0.0 oz/week     Comment: occasionally  . Drug use: Yes    Types: Cocaine, Marijuana     Comment: no cocaine for several years. Most recent use  of mairjuana was 06/2016  . Sexual activity: Not Asked   Other Topics Concern  . None   Social History Narrative   He quit smoking in 2010. 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 He developed pulmonary problems, became disabled because of lower airway disease in 2009.    Outpatient Encounter Prescriptions as of 12/27/2016  Medication Sig  . amLODipine (NORVASC) 10 MG tablet Take 10 mg by mouth daily.   Marland Kitchen aspirin EC 81 MG tablet Take 1 tablet (81 mg total) by mouth daily.  . budesonide-formoterol (SYMBICORT) 160-4.5 MCG/ACT inhaler Inhale 2 puffs into the lungs 2 (two) times daily as needed (Shortness of Breath).   . cetirizine (ZYRTEC) 10 MG tablet Take 10 mg by mouth at bedtime as needed for allergies.   . Cholecalciferol (VITAMIN D) 2000 units tablet Take 2,000 Units by mouth daily.  . furosemide (LASIX) 40 MG tablet Take 1 tablet by mouth daily.  . hydrALAZINE (APRESOLINE) 25 MG tablet Take 25 mg by mouth 3 (three) times daily.  . Insulin Glargine (LANTUS SOLOSTAR) 100 UNIT/ML Solostar Pen INJECT 40 UNITS SUBCUTANEOUSLY AT BEDTIME.  . iron polysaccharides (NIFEREX) 150 MG capsule Take 150 mg by mouth 2 (two) times daily.   . mometasone (NASONEX) 50 MCG/ACT nasal spray Place 2 sprays into the nose 2 (two) times daily as needed (allergies).   . pantoprazole  (PROTONIX) 40 MG tablet 1 PO 30 MINS PRIOR TO MEALS BID (Patient taking differently: Take 40 mg by mouth 2 (two) times daily before a meal. )  . simvastatin (ZOCOR) 20 MG tablet Take 20 mg by mouth every morning.   . TRADJENTA 5 MG TABS tablet TAKE 1 TABLET EVERY DAY   No facility-administered encounter medications on file as of 12/27/2016.    ALLERGIES: Allergies  Allergen Reactions  . Opana [Oxymorphone Hcl] Itching  . Tramadol Itching   VACCINATION STATUS: Immunization History  Administered Date(s) Administered  . Td 03/27/2011    Diabetes  He presents  for his follow-up diabetic visit. He has type 2 diabetes mellitus. Onset time: He was diagnosed approximately 20 years ago. His disease course has been improving. There are no hypoglycemic associated symptoms. Pertinent negatives for hypoglycemia include no confusion, headaches, pallor or seizures. Associated symptoms include polyuria. Pertinent negatives for diabetes include no chest pain, no fatigue, no polydipsia, no polyphagia and no weakness. There are no hypoglycemic complications. Symptoms are improving. Diabetic complications include nephropathy. Risk factors for coronary artery disease include dyslipidemia, diabetes mellitus, hypertension, obesity, sedentary lifestyle, tobacco exposure and male sex. Current diabetic treatment includes insulin injections and oral agent (monotherapy). He is compliant with treatment most of the time. His weight is stable. He is following a generally unhealthy diet. He has had a previous visit with a dietitian. He rarely participates in exercise. His breakfast blood glucose range is generally 110-130 mg/dl. His overall blood glucose range is 110-130 mg/dl. Eye exam is current.  Hypertension  This is a chronic problem. The current episode started more than 1 year ago. The problem is controlled. Pertinent negatives include no chest pain, headaches, neck pain, palpitations or shortness of breath. Risk factors  for coronary artery disease include diabetes mellitus, dyslipidemia, obesity, smoking/tobacco exposure and sedentary lifestyle. Past treatments include angiotensin blockers. Hypertensive end-organ damage includes kidney disease.  Hyperlipidemia  This is a chronic problem. The current episode started more than 1 year ago. Pertinent negatives include no chest pain, myalgias or shortness of breath. Current antihyperlipidemic treatment includes statins. Risk factors for coronary artery disease include diabetes mellitus, dyslipidemia, hypertension, male sex and a sedentary lifestyle.     Review of Systems  Constitutional: Negative for fatigue and unexpected weight change.  HENT: Negative for dental problem, mouth sores and trouble swallowing.   Eyes: Negative for visual disturbance.  Respiratory: Negative for cough, choking, chest tightness, shortness of breath and wheezing.   Cardiovascular: Negative for chest pain, palpitations and leg swelling.  Gastrointestinal: Negative for abdominal distention, abdominal pain, constipation, diarrhea, nausea and vomiting.  Endocrine: Positive for polyuria. Negative for polydipsia and polyphagia.  Genitourinary: Negative for dysuria, flank pain, hematuria and urgency.  Musculoskeletal: Negative for back pain, gait problem, myalgias and neck pain.  Skin: Negative for pallor, rash and wound.  Neurological: Negative for seizures, syncope, weakness, numbness and headaches.  Psychiatric/Behavioral: Negative.  Negative for confusion and dysphoric mood.    Objective:    BP (!) 142/84   Pulse 72   Ht 5\' 4"  (1.626 m)   Wt 242 lb (109.8 kg)   BMI 41.54 kg/m   Wt Readings from Last 3 Encounters:  12/27/16 242 lb (109.8 kg)  12/14/16 241 lb 12.8 oz (109.7 kg)  11/02/16 245 lb (111.1 kg)    Physical Exam  Constitutional: He is oriented to person, place, and time. He appears well-developed and well-nourished. He is cooperative. No distress.  HENT:  Head:  Normocephalic and atraumatic.  Eyes: EOM are normal.  Neck: Normal range of motion. Neck supple. No tracheal deviation present. No thyromegaly present.  Cardiovascular: Normal rate, S1 normal, S2 normal and normal heart sounds.  Exam reveals no gallop.   No murmur heard. Pulses:      Dorsalis pedis pulses are 1+ on the right side, and 1+ on the left side.       Posterior tibial pulses are 1+ on the right side, and 1+ on the left side.  Pulmonary/Chest: Breath sounds normal. No respiratory distress. He has no wheezes.  Abdominal: Soft. Bowel sounds are  normal. He exhibits no distension. There is no tenderness. There is no guarding and no CVA tenderness.  Musculoskeletal: He exhibits no edema.       Right shoulder: He exhibits no swelling and no deformity.  Neurological: He is alert and oriented to person, place, and time. He has normal strength and normal reflexes. No cranial nerve deficit or sensory deficit. Gait normal.  Skin: Skin is warm and dry. No rash noted. No cyanosis. Nails show no clubbing.  Psychiatric: He has a normal mood and affect. His speech is normal and behavior is normal. Judgment and thought content normal. Cognition and memory are normal.    Results for orders placed or performed in visit on 18/56/31  Basic metabolic panel  Result Value Ref Range   BUN 43 (A) 4 - 21   Creatinine 4.8 (A) 0.6 - 1.3  Hemoglobin A1c  Result Value Ref Range   Hemoglobin A1C 6.4    Diabetic Labs (most recent): Lab Results  Component Value Date   HGBA1C 6.4 12/19/2016   HGBA1C 7.0 (H) 09/15/2016   HGBA1C 7.0 (H) 06/16/2016   Lipid Panel     Component Value Date/Time   CHOL 146 06/16/2016 0956   TRIG 190 (H) 06/16/2016 0956   HDL 36 (L) 06/16/2016 0956   CHOLHDL 4.1 06/16/2016 0956   VLDL 38 (H) 06/16/2016 0956   LDLCALC 72 06/16/2016 0956     Assessment & Plan:   1. Type 2 diabetes mellitus with stage 4 chronic kidney disease, with long-term current use of insulin  (HCC)  -His  Diabetes is complicated by chronic kidney disease and patient remains at a high risk for more acute and chronic complications of diabetes which include CAD, CVA, CKD, retinopathy, and neuropathy. These are all discussed in detail with the patient.  Patient came with controlled fasting glucose profile, and  recent A1c of   6.4 % improving from 7.4% last visit.  He did not bring his meter nor logs to review today.  Recent labs reviewed.   - I have re-counseled the patient on diet management and weight loss  by adopting a carbohydrate restricted / protein rich  Diet.  - Suggestion is made for patient to avoid simple carbohydrates   from his diet including Cakes , Desserts, Ice Cream,  Soda (  diet and regular) , Sweet Tea , Candies,  Chips, Cookies, Artificial Sweeteners,   and "Sugar-free" Products .  This will help patient to have stable blood glucose profile and potentially avoid unintended  Weight gain.  - Patient is advised to stick to a routine mealtimes to eat 3 meals  a day and avoid unnecessary snacks ( to snack only to correct hypoglycemia). I have also advised him to cut back on alcohol consumption.  - The patient  has been  scheduled with Jearld Fenton, RDN, CDE for individualized DM education.  - I have approached patient with the following individualized plan to manage diabetes and patient agrees.   -He is advised to stay away from ETOH and smoking. He will continue to need at least basal insulin therapy.  -I will continue  Lantus  40 units  daily at bedtime , off of Humalog . Continue Tradjenta 5mg  po qday.   He is following with Jearld Fenton, CDE for DM education.  He will be monitoring of BG BID.  -He will continue to f/u with Nephrology given stage 4-5 CKD, he has a dialysis fistula in place.  -Patient is not a  candidate for metformin and SGLT2 inhibitors due to CKD.  - Patient specific target  for A1c; LDL, HDL, Triglycerides, and  Waist Circumference were  discussed in detail.  2) BP/HTN: Controlled. Continue current medications including ACEI/ARB. 3) Lipids/HPL:  continue statins. 4)  Weight/Diet: CDE consult in progress, exercise, and carbohydrates information provided. 5) Vitamin D deficiency - He is status post therapy with vitamin D 50,000 weekly for 12 weeks, currently on vitamin D3 5,000 units daily. His insurance would not cover for repeat vitamin D test.   6) Chronic Care/Health Maintenance:  -Patient is on ACEI/ARB and Statin medications and encouraged to continue to follow up with Ophthalmology, Podiatrist at least yearly or according to recommendations, and advised to quit smoking. I have recommended yearly flu vaccine and pneumonia vaccination at least every 5 years; moderate intensity exercise for up to 150 minutes weekly; and  sleep for at least 7 hours a day.  - 25 minutes of time was spent on the care of this patient , 50% of which was applied for counseling on diabetes complications and their preventions.  - I advised patient to maintain close follow up with their PCP for primary care needs. - Patient is asked to bring meter and  blood glucose logs during his next visit.   Follow up plan: -Return in about 3 months (around 03/29/2017).  Glade Lloyd, MD Phone: [phone]  Fax: [phone]   12/27/2016, 9:45 AM

## 2016-12-27 NOTE — Patient Instructions (Signed)

## 2017-01-04 ENCOUNTER — Other Ambulatory Visit: Payer: Self-pay | Admitting: "Endocrinology

## 2017-01-04 NOTE — Telephone Encounter (Signed)
Received fax from [facility]. Pt had appt 01/26/17 at 3:30pm with Dr. Alyson Ingles, but pt cancelled and said he will call back to reschedule when needed.

## 2017-01-26 ENCOUNTER — Other Ambulatory Visit: Payer: Self-pay

## 2017-01-26 MED ORDER — PEN NEEDLES 31G X 6 MM MISC: 1.0000 | Freq: Every day | 5 refills | Status: DC

## 2017-02-05 ENCOUNTER — Telehealth: Payer: Self-pay | Admitting: "Endocrinology

## 2017-02-05 MED ORDER — "PEN NEEDLES 3/16"" 31G X 5 MM MISC": 5 refills | Status: DC

## 2017-02-05 NOTE — Telephone Encounter (Signed)
[name] [name] states that the wrong needles were sent in to the pharmacy it needs to be Ultra Fine Needles 5 mm 31 gauge 3/16, please advise

## 2017-02-07 ENCOUNTER — Emergency Department (HOSPITAL_COMMUNITY): Payer: Medicare Other

## 2017-02-07 ENCOUNTER — Emergency Department (HOSPITAL_COMMUNITY)
Admission: EM | Admit: 2017-02-07 | Discharge: 2017-02-07 | Disposition: A | Discharge status: Home / Self Care | Payer: Medicare Other | Attending: Emergency Medicine | Admitting: Emergency Medicine

## 2017-02-07 ENCOUNTER — Encounter (HOSPITAL_COMMUNITY): Payer: Self-pay | Admitting: Emergency Medicine

## 2017-02-07 DIAGNOSIS — Z87891 Personal history of nicotine dependence: Secondary | ICD-10-CM | POA: Diagnosis not present

## 2017-02-07 DIAGNOSIS — G8929 Other chronic pain: Secondary | ICD-10-CM | POA: Diagnosis not present

## 2017-02-07 DIAGNOSIS — Z79899 Other long term (current) drug therapy: Secondary | ICD-10-CM | POA: Diagnosis not present

## 2017-02-07 DIAGNOSIS — I2510 Atherosclerotic heart disease of native coronary artery without angina pectoris: Secondary | ICD-10-CM | POA: Diagnosis not present

## 2017-02-07 DIAGNOSIS — Z794 Long term (current) use of insulin: Secondary | ICD-10-CM | POA: Insufficient documentation

## 2017-02-07 DIAGNOSIS — E1122 Type 2 diabetes mellitus with diabetic chronic kidney disease: Secondary | ICD-10-CM | POA: Insufficient documentation

## 2017-02-07 DIAGNOSIS — Z7982 Long term (current) use of aspirin: Secondary | ICD-10-CM | POA: Diagnosis not present

## 2017-02-07 DIAGNOSIS — I129 Hypertensive chronic kidney disease with stage 1 through stage 4 chronic kidney disease, or unspecified chronic kidney disease: Secondary | ICD-10-CM | POA: Diagnosis not present

## 2017-02-07 DIAGNOSIS — N184 Chronic kidney disease, stage 4 (severe): Secondary | ICD-10-CM | POA: Insufficient documentation

## 2017-02-07 DIAGNOSIS — M25562 Pain in left knee: Secondary | ICD-10-CM | POA: Insufficient documentation

## 2017-02-07 LAB — CBG MONITORING, ED: Glucose-Capillary: 114 mg/dL — ABNORMAL HIGH (ref 65–99)

## 2017-02-07 MED ORDER — ONDANSETRON 8 MG PO TBDP
8.0000 mg | ORAL_TABLET | Freq: Once | ORAL | Status: AC
  Administered 2017-02-07: 8 mg via ORAL
  Filled 2017-02-07: qty 1

## 2017-02-07 MED ORDER — MORPHINE SULFATE (PF) 4 MG/ML IV SOLN
8.0000 mg | Freq: Once | INTRAVENOUS | Status: AC
  Administered 2017-02-07: 8 mg via INTRAMUSCULAR
  Filled 2017-02-07: qty 2

## 2017-02-07 MED ORDER — DICLOFENAC SODIUM 1 % TD GEL
4.0000 g | Freq: Four times a day (QID) | TRANSDERMAL | 0 refills | Status: DC
Start: 2017-02-07 — End: 2017-02-27

## 2017-02-07 MED ORDER — HYDROCODONE-ACETAMINOPHEN 7.5-325 MG PO TABS: 1.0000 | ORAL_TABLET | Freq: Four times a day (QID) | ORAL | 0 refills | Status: DC | PRN

## 2017-02-07 NOTE — ED Triage Notes (Signed)
Pt reports left knee pain since Saturday with no known injury.

## 2017-02-07 NOTE — Discharge Instructions (Signed)
Wear the knee brace as needed.  Do not wear to bed.  Call Dr. Ruthe Mannan office to arrange a follow-up appt.

## 2017-02-07 NOTE — ED Provider Notes (Signed)
[facility] DEPT Provider Note   CSN: [id number] Arrival date & time: 02/07/17  0727     History   Chief Complaint Chief Complaint  Patient presents with  . Knee Pain    HPI [name] [name] is a 66 y.o. male.  HPI   [name] [name] is a 66 y.o. male with hx of chronic knee pain,  presents to the Emergency Department complaining of left knee pain for three days.  Describes a constant, throbbing pain to the anterior knee that has gradually worsened.  Denies known injury, but states he may have "stepped wrong."  Pain is worse with bending and weight bearing.  He has not tried any therapies at home.  Denies radiating pain, swelling, calf pain or discoloration.    Past Medical History:  Diagnosis Date  . Anemia   . Arthritis   . Bell palsy   . CAD (coronary artery disease)    a. 2014 MV: abnl w/ infap ischemia; b. 03/2013 Cath: aneurysmal bleb in the LAD w/ otw nonobs dzs-->Med Rx.  . Chronic back pain   . Chronic knee pain    a. 09/2015 s/p R TKA.  Marland Kitchen Chronic pain   . Chronic shoulder pain   . Chronic sinusitis   . CKD (chronic kidney disease), stage IV ([place])    stage 4 per office visit note of Dr Lowanda Foster on 05/2015   . COPD (chronic obstructive pulmonary disease) ([place])   . Diabetes mellitus without complication ([place])   . Dyspnea   . Essential hypertension   . GERD (gastroesophageal reflux disease)   . Hepatomegaly    noted on noncontrast CT 2015  . History of hiatal hernia   . Hyperlipidemia   . Lateral meniscus tear   . Obesity    Truncal  . Obstructive sleep apnea    cpap - settings at 4   . On home oxygen therapy    uses 2l when is going somewhere per patient   . Pneumonia   . Post traumatic stress disorder   . PUD (peptic ulcer disease)    remote, reports f/u EGD about 8 years ago unremarkable   . Reactive airway disease    related to exposure to chemical during 9/11  . Sinusitis   . Vitamin D deficiency     Patient Active Problem List   Diagnosis  Date Noted  . Foul smelling urine 12/14/2016  . Coronary artery disease involving native coronary artery of native heart without angina pectoris 10/27/2016  . Chronic kidney disease, stage IV (severe) ([place]) 10/27/2016  . Morbid obesity due to excess calories ([place]) 03/22/2016  . CAD (coronary artery disease)   . Erectile dysfunction 08/16/2015  . Hemorrhoids 07/05/2015  . Vitamin D deficiency 06/04/2015  . OSA on CPAP 05/14/2015  . Anemia in chronic kidney disease 03/24/2015  . Fatty liver 12/01/2014  . Back pain 06/05/2013  . OA (osteoarthritis) of knee 03/15/2011  . CTS (carpal tunnel syndrome) 03/15/2011  . Type 2 diabetes mellitus with stage 4 chronic kidney disease, with long-term current use of insulin ([place]) 09/29/2008  . Hyperlipidemia 09/29/2008  . Essential hypertension 09/29/2008  . GERD 09/29/2008    Past Surgical History:  Procedure Laterality Date  . ASAD LT SHOULDER  12/2008   left shoulder  . AV FISTULA PLACEMENT Left 08/09/2016   Procedure: BRACHIOCEPHALIC ARTERIOVENOUS (AV) FISTULA CREATION LEFT ARM;  Surgeon: [name], MD;  Location: [facility];  Service: Vascular;  Laterality: Left;  . CATARACT  EXTRACTION W/PHACO Left 03/28/2016   Procedure: CATARACT EXTRACTION PHACO AND INTRAOCULAR LENS PLACEMENT LEFT EYE;  Surgeon: [name], MD;  Location: [facility];  Service: Ophthalmology;  Laterality: Left;  CDE: 4.77  . CATARACT EXTRACTION W/PHACO Right 04/11/2016   Procedure: CATARACT EXTRACTION PHACO AND INTRAOCULAR LENS PLACEMENT RIGHT EYE; CDE:  4.74;  Surgeon: [name], MD;  Location: [facility];  Service: Ophthalmology;  Laterality: Right;  . COLONOSCOPY  10/2008   Fields: Rectal polyp obliterated, not retrieved, hemorrhoids, single ascending colon diverticulum near the CV. Next colonoscopy April 2020  . COLONOSCOPY N/A 12/25/2014   SLF: 1. Colorectal polyps (2) removed 2. Small internal hemorrhoids 3. the left colon is severely redundant  . DOPPLER ECHOCARDIOGRAPHY      . ESOPHAGOGASTRODUODENOSCOPY N/A 12/25/2014   SLF: 1. Anemia most likely due to CRI, gastritis, gastric polyps 2. Moderate non-erosive gastriits and mild duodenitis.  3.TWo large gstric polyps removed.   Marland Kitchen EYE SURGERY  12/22/2010   tear duct probing-[place]  . FOREIGN BODY REMOVAL  03/29/2011   Procedure: REMOVAL FOREIGN BODY EXTREMITY;  Surgeon: Arther Abbott, MD;  Location: [facility];  Service: Orthopedics;  Laterality: Right;  Removal Foreign Body Right Thumb  . KNEE ARTHROSCOPY  10/2007   left  . KNEE ARTHROSCOPY WITH LATERAL MENISECTOMY Right 10/14/2015   Procedure: LEFT KNEE ARTHROSCOPY WITH PARTIAL LATERAL MENISECTOMY;  Surgeon: Carole Civil, MD;  Location: [facility];  Service: Orthopedics;  Laterality: Right;  . LEFT HEART CATHETERIZATION WITH CORONARY ANGIOGRAM N/A 03/28/2013   Procedure: LEFT HEART CATHETERIZATION WITH CORONARY ANGIOGRAM;  Surgeon: Leonie Man, MD;  [facility] LAB;  Service: Cardiovascular;  Laterality: N/A;  . NM MYOVIEW LTD    . PENILE PROSTHESIS IMPLANT N/A 08/16/2015   Procedure: PENILE PROTHESIS INFLATABLE, three piece, Excisional biopsy of Penile ulcer, Penile molding;  Surgeon: Carolan Clines, MD;  Location: [facility];  Service: Urology;  Laterality: N/A;  . QUADRICEPS TENDON REPAIR  07/21/2011   Procedure: REPAIR QUADRICEP TENDON;  Surgeon: Arther Abbott, MD;  Location: [facility];  Service: Orthopedics;  Laterality: Right;  . TOENAIL EXCISION     removed [id number]  . UMBILICAL HERNIA REPAIR  2007   [place]       Home Medications    Prior to Admission medications   Medication Sig Start Date End Date Taking? Authorizing Provider  amLODipine (NORVASC) 10 MG tablet Take 10 mg by mouth daily.     [provider]  aspirin EC 81 MG tablet Take 1 tablet (81 mg total) by mouth daily. 05/01/16   Hilty, Nadean Corwin, MD  budesonide-formoterol (SYMBICORT) 160-4.5 MCG/ACT inhaler Inhale 2 puffs into the lungs 2 (two) times daily as needed  (Shortness of Breath).     [provider]  cetirizine (ZYRTEC) 10 MG tablet Take 10 mg by mouth at bedtime as needed for allergies.     [provider]  Cholecalciferol (VITAMIN D) 2000 units tablet Take 2,000 Units by mouth daily.    [provider]  furosemide (LASIX) 40 MG tablet Take 1 tablet by mouth daily. 10/18/16   [provider]  hydrALAZINE (APRESOLINE) 25 MG tablet Take 25 mg by mouth 3 (three) times daily. 06/29/16   [provider]  Insulin Glargine (LANTUS SOLOSTAR) 100 UNIT/ML Solostar Pen INJECT 40 UNITS SUBCUTANEOUSLY AT BEDTIME. 08/09/16   Rhyne, Hulen Shouts, PA-C  Insulin Pen Needle (PEN NEEDLES 3/16") 31G X 5 MM MISC Use as directed qhs 02/05/17   Cassandria Anger,  MD  iron polysaccharides (NIFEREX) 150 MG capsule Take 150 mg by mouth 2 (two) times daily.     [provider]  mometasone (NASONEX) 50 MCG/ACT nasal spray Place 2 sprays into the nose 2 (two) times daily as needed (allergies).     [provider]  pantoprazole (PROTONIX) 40 MG tablet 1 PO 30 MINS PRIOR TO MEALS BID Patient taking differently: Take 40 mg by mouth 2 (two) times daily before a meal.  12/30/15   Fields, Sandi L, MD  simvastatin (ZOCOR) 20 MG tablet Take 20 mg by mouth every morning.     [provider]  TRADJENTA 5 MG TABS tablet TAKE 1 TABLET EVERY DAY 01/08/17   Cassandria Anger, MD    Family History Family History  Problem Relation Age of Onset  . Hypertension Mother        MI  . Cancer Mother        breast   . Diabetes Mother   . Diabetes Father   . Hypertension Father   . Hypertension Sister   . Diabetes Sister   . Arthritis Unknown   . Asthma Unknown   . Lung disease Unknown   . Anesthesia problems Neg Hx   . Hypotension Neg Hx   . Malignant hyperthermia Neg Hx   . Pseudochol deficiency Neg Hx   . Colon cancer Neg Hx     Social History Social History  Substance Use Topics  . Smoking status: Former  Smoker    Packs/day: 1.00    Years: 25.00    Types: Cigarettes    Quit date: 03/27/2010  . Smokeless tobacco: Never Used     Comment: Quit x 7 years  . Alcohol use 0.0 oz/week     Comment: occasionally     Allergies   Opana [oxymorphone hcl] and Tramadol   Review of Systems Review of Systems  Constitutional: Negative for chills and fever.  Genitourinary: Negative for difficulty urinating and dysuria.  Musculoskeletal: Positive for arthralgias (left knee pain). Negative for joint swelling.  Skin: Negative for color change and wound.  Neurological: Negative for weakness and numbness.  All other systems reviewed and are negative.    Physical Exam Updated Vital Signs BP (!) 169/92 (BP Location: Right Arm)   Pulse 93   Temp 98.7 F (37.1 C) (Oral)   Resp 18   Ht 5\' 4"  (1.626 m)   Wt 109.8 kg (242 lb)   SpO2 94%   BMI 41.54 kg/m   Physical Exam  Constitutional: He is oriented to person, place, and time. He appears well-developed and well-nourished. No distress.  Cardiovascular: Normal rate, regular rhythm, normal heart sounds and intact distal pulses.   Pulmonary/Chest: Effort normal and breath sounds normal.  Musculoskeletal: He exhibits tenderness. He exhibits no edema or deformity.  ttp of the anterior left knee.  No edema,  erythema, effusion, or step-off deformity.  DP pulse brisk, distal sensation intact. Calf is soft and NT.  Neurological: He is alert and oriented to person, place, and time. He exhibits normal muscle tone. Coordination normal.  Skin: Skin is warm and dry. Capillary refill takes less than 2 seconds. No erythema.  Nursing note and vitals reviewed.    ED Treatments / Results  Labs (all labs ordered are listed, but only abnormal results are displayed) Labs Reviewed - No data to display  EKG  EKG Interpretation None       Radiology Dg Knee Complete 4 Views Left  Result  Date: 02/07/2017 CLINICAL DATA:  Knee pain no injury EXAM: LEFT KNEE -  COMPLETE 4+ VIEW COMPARISON:  06/05/2013 FINDINGS: Joint space narrowing and spurring in the medial joint compartment similar to the prior study. Lateral joint and patellofemoral joint normal. Negative for fracture or effusion. Mild atherosclerotic calcification IMPRESSION: Joint space narrowing and spurring in the medial joint compartment. No acute abnormality and no change from 2014 Electronically Signed   By: Franchot Gallo M.D.   On: 02/07/2017 08:53    Procedures Procedures (including critical care time)  Medications Ordered in ED Medications  morphine 4 MG/ML injection 8 mg (8 mg Intramuscular Given 02/07/17 0829)  ondansetron (ZOFRAN-ODT) disintegrating tablet 8 mg (8 mg Oral Given 02/07/17 0859)     Initial Impression / Assessment and Plan / ED Course  I have reviewed the triage vital signs and the nursing notes.  Pertinent labs & imaging results that were available during my care of the patient were reviewed by me and considered in my medical decision making (see chart for details).     Pt is well appearing.  Vitals stable.  No concerning sx's for septic joint.  Has hx of chronic knee pain.  NV intact.  XR results discussed.  Appears stable for d/c, pt agrees to ortho f/u.    Knee sleeve applied.  Pain improved.   Final Clinical Impressions(s) / ED Diagnoses   Final diagnoses:  Chronic pain of left knee    New Prescriptions New Prescriptions   No medications on file     Bufford Lope 02/07/17 1106    Milton Ferguson, MD 02/07/17 [phone]

## 2017-02-12 DIAGNOSIS — E1129 Type 2 diabetes mellitus with other diabetic kidney complication: Secondary | ICD-10-CM | POA: Diagnosis not present

## 2017-02-12 DIAGNOSIS — R809 Proteinuria, unspecified: Secondary | ICD-10-CM | POA: Diagnosis not present

## 2017-02-12 DIAGNOSIS — N184 Chronic kidney disease, stage 4 (severe): Secondary | ICD-10-CM | POA: Diagnosis not present

## 2017-02-12 DIAGNOSIS — D519 Vitamin B12 deficiency anemia, unspecified: Secondary | ICD-10-CM | POA: Diagnosis not present

## 2017-02-12 DIAGNOSIS — D509 Iron deficiency anemia, unspecified: Secondary | ICD-10-CM | POA: Diagnosis not present

## 2017-02-12 DIAGNOSIS — I10 Essential (primary) hypertension: Secondary | ICD-10-CM | POA: Diagnosis not present

## 2017-02-13 DIAGNOSIS — E1142 Type 2 diabetes mellitus with diabetic polyneuropathy: Secondary | ICD-10-CM | POA: Diagnosis not present

## 2017-02-13 DIAGNOSIS — B351 Tinea unguium: Secondary | ICD-10-CM | POA: Diagnosis not present

## 2017-02-14 DIAGNOSIS — E1129 Type 2 diabetes mellitus with other diabetic kidney complication: Secondary | ICD-10-CM | POA: Diagnosis not present

## 2017-02-14 DIAGNOSIS — G4733 Obstructive sleep apnea (adult) (pediatric): Secondary | ICD-10-CM | POA: Diagnosis not present

## 2017-02-14 DIAGNOSIS — R809 Proteinuria, unspecified: Secondary | ICD-10-CM | POA: Diagnosis not present

## 2017-02-14 DIAGNOSIS — D631 Anemia in chronic kidney disease: Secondary | ICD-10-CM | POA: Diagnosis not present

## 2017-02-14 DIAGNOSIS — N185 Chronic kidney disease, stage 5: Secondary | ICD-10-CM | POA: Diagnosis not present

## 2017-02-16 ENCOUNTER — Other Ambulatory Visit: Payer: Self-pay | Admitting: Physician Assistant

## 2017-02-23 ENCOUNTER — Other Ambulatory Visit: Payer: Self-pay | Admitting: Gastroenterology

## 2017-02-27 ENCOUNTER — Ambulatory Visit (INDEPENDENT_AMBULATORY_CARE_PROVIDER_SITE_OTHER): Payer: Medicare Other | Admitting: Orthopedic Surgery

## 2017-02-27 ENCOUNTER — Encounter: Payer: Self-pay | Admitting: Orthopedic Surgery

## 2017-02-27 VITALS — BP 124/78 | HR 82 | Ht 64.5 in | Wt 237.0 lb

## 2017-02-27 DIAGNOSIS — M25562 Pain in left knee: Secondary | ICD-10-CM

## 2017-02-27 DIAGNOSIS — M1712 Unilateral primary osteoarthritis, left knee: Secondary | ICD-10-CM | POA: Diagnosis not present

## 2017-02-27 DIAGNOSIS — G8929 Other chronic pain: Secondary | ICD-10-CM | POA: Diagnosis not present

## 2017-02-27 DIAGNOSIS — M79671 Pain in right foot: Secondary | ICD-10-CM | POA: Diagnosis not present

## 2017-02-27 DIAGNOSIS — I2510 Atherosclerotic heart disease of native coronary artery without angina pectoris: Secondary | ICD-10-CM | POA: Diagnosis not present

## 2017-02-27 MED ORDER — DICLOFENAC SODIUM 1 % TD GEL: 4.0000 g | Freq: Four times a day (QID) | TRANSDERMAL | 0 refills | Status: DC

## 2017-02-27 NOTE — Patient Instructions (Signed)
CONTINUE:   ELASTIC SLEEVE   CREAM   ARTHRITIS PILL

## 2017-02-27 NOTE — Progress Notes (Signed)
[name] [name] is a 66 y.o. male   HPI:  Knee Pain: Patient presents with knee pain involving the  left knee. Onset of the symptoms was several weeks ago. Inciting event: none known. Current symptoms include pain located Anterolateral left knee, stiffness and swelling. Pain is aggravated by any weight bearing, rising after sitting, squatting, standing and walking.  Patient has had prior knee problems. Evaluation to date: plain films: abnormal Osteoarthritis medial compartment and ER visit where they gave him Voltaren gel, recommend over-the-counter arthritis medicine which she did put ice and heat on it and he did get some improvement. Treatment to date: elastic supporter which is somewhat effective, OTC analgesics which are somewhat effective and prescription NSAIDS which are somewhat effective.  Past Medical History:  Diagnosis Date  . Anemia   . Arthritis   . Bell palsy   . CAD (coronary artery disease)    a. 2014 MV: abnl w/ infap ischemia; b. 03/2013 Cath: aneurysmal bleb in the LAD w/ otw nonobs dzs-->Med Rx.  . Chronic back pain   . Chronic knee pain    a. 09/2015 s/p R TKA.  Marland Kitchen Chronic pain   . Chronic shoulder pain   . Chronic sinusitis   . CKD (chronic kidney disease), stage IV ([place])    stage 4 per office visit note of Dr Lowanda Foster on 05/2015   . COPD (chronic obstructive pulmonary disease) ([place])   . Diabetes mellitus without complication ([place])   . Dyspnea   . Essential hypertension   . GERD (gastroesophageal reflux disease)   . Hepatomegaly    noted on noncontrast CT 2015  . History of hiatal hernia   . Hyperlipidemia   . Lateral meniscus tear   . Obesity    Truncal  . Obstructive sleep apnea    cpap - settings at 4   . On home oxygen therapy    uses 2l when is going somewhere per patient   . Pneumonia   . Post traumatic stress disorder   . PUD (peptic ulcer disease)    remote, reports f/u EGD about 8 years ago unremarkable   . Reactive airway disease    related to  exposure to chemical during 9/11  . Sinusitis   . Vitamin D deficiency    Past Surgical History:  Procedure Laterality Date  . ASAD LT SHOULDER  12/2008   left shoulder  . AV FISTULA PLACEMENT Left 08/09/2016   Procedure: BRACHIOCEPHALIC ARTERIOVENOUS (AV) FISTULA CREATION LEFT ARM;  Surgeon: [name], MD;  Location: [facility];  Service: Vascular;  Laterality: Left;  . CATARACT EXTRACTION W/PHACO Left 03/28/2016   Procedure: CATARACT EXTRACTION PHACO AND INTRAOCULAR LENS PLACEMENT LEFT EYE;  Surgeon: [name], MD;  [facility];  Service: Ophthalmology;  Laterality: Left;  CDE: 4.77  . CATARACT EXTRACTION W/PHACO Right 04/11/2016   Procedure: CATARACT EXTRACTION PHACO AND INTRAOCULAR LENS PLACEMENT RIGHT EYE; CDE:  4.74;  Surgeon: [name], MD;  Location: [facility];  Service: Ophthalmology;  Laterality: Right;  . COLONOSCOPY  10/2008   Fields: Rectal polyp obliterated, not retrieved, hemorrhoids, single ascending colon diverticulum near the CV. Next colonoscopy April 2020  . COLONOSCOPY N/A 12/25/2014   SLF: 1. Colorectal polyps (2) removed 2. Small internal hemorrhoids 3. the left colon is severely redundant  . DOPPLER ECHOCARDIOGRAPHY    . ESOPHAGOGASTRODUODENOSCOPY N/A 12/25/2014   SLF: 1. Anemia most likely due to CRI, gastritis, gastric polyps 2. Moderate non-erosive gastriits and mild duodenitis.  3.TWo  large gstric polyps removed.   Marland Kitchen EYE SURGERY  12/22/2010   tear duct probing-[place]  . FOREIGN BODY REMOVAL  03/29/2011   Procedure: REMOVAL FOREIGN BODY EXTREMITY;  Surgeon: Arther Abbott, MD;  Location: [facility];  Service: Orthopedics;  Laterality: Right;  Removal Foreign Body Right Thumb  . KNEE ARTHROSCOPY  10/2007   left  . KNEE ARTHROSCOPY WITH LATERAL MENISECTOMY Right 10/14/2015   Procedure: LEFT KNEE ARTHROSCOPY WITH PARTIAL LATERAL MENISECTOMY;  Surgeon: Carole Civil, MD;  Location[facility];  Service: Orthopedics;  Laterality: Right;  . LEFT HEART CATHETERIZATION  WITH CORONARY ANGIOGRAM N/A 03/28/2013   Procedure: LEFT HEART CATHETERIZATION WITH CORONARY ANGIOGRAM;  Surgeon: Leonie Man, MD;  Location: [facility] CATH LAB;  Service: Cardiovascular;  Laterality: N/A;  . NM MYOVIEW LTD    . PENILE PROSTHESIS IMPLANT N/A 08/16/2015   Procedure: PENILE PROTHESIS INFLATABLE, three piece, Excisional biopsy of Penile ulcer, Penile molding;  Surgeon: Carolan Clines, MD;  [facility];  Service: Urology;  Laterality: N/A;  . QUADRICEPS TENDON REPAIR  07/21/2011   Procedure: REPAIR QUADRICEP TENDON;  Surgeon: Arther Abbott, MD;  Location: [facility];  Service: Orthopedics;  Laterality: Right;  . TOENAIL EXCISION     removed [id number]  . UMBILICAL HERNIA REPAIR  2007   [place]    Current Outpatient Prescriptions:  .  amLODipine (NORVASC) 10 MG tablet, Take 10 mg by mouth daily. , Disp: , Rfl:  .  aspirin EC 81 MG tablet, Take 1 tablet (81 mg total) by mouth daily., Disp: 90 tablet, Rfl: 3 .  budesonide-formoterol (SYMBICORT) 160-4.5 MCG/ACT inhaler, Inhale 2 puffs into the lungs 2 (two) times daily as needed (Shortness of Breath). , Disp: , Rfl:  .  cetirizine (ZYRTEC) 10 MG tablet, Take 10 mg by mouth at bedtime as needed for allergies. , Disp: , Rfl:  .  Cholecalciferol (VITAMIN D) 2000 units tablet, Take 2,000 Units by mouth daily., Disp: , Rfl:  .  diclofenac sodium (VOLTAREN) 1 % GEL, Apply 4 g topically 4 (four) times daily. To left knee, Disp: 100 g, Rfl: 0 .  furosemide (LASIX) 40 MG tablet, Take 1 tablet by mouth daily., Disp: , Rfl:  .  hydrALAZINE (APRESOLINE) 25 MG tablet, Take 25 mg by mouth 3 (three) times daily., Disp: , Rfl:  .  HYDROcodone-acetaminophen (NORCO) 7.5-325 MG tablet, Take 1 tablet by mouth every 6 (six) hours as needed for moderate pain., Disp: 15 tablet, Rfl: 0 .  Insulin Pen Needle (PEN NEEDLES 3/16") 31G X 5 MM MISC, Use as directed qhs, Disp: 100 each, Rfl: 5 .  iron polysaccharides (NIFEREX) 150 MG capsule, Take 150 mg by  mouth 2 (two) times daily. , Disp: , Rfl:  .  LANTUS SOLOSTAR 100 UNIT/ML Solostar Pen, INJECT 40 UNITS SUBCUTANEOUSLY AT BEDTIME., Disp: 45 mL, Rfl: 0 .  mometasone (NASONEX) 50 MCG/ACT nasal spray, Place 2 sprays into the nose 2 (two) times daily as needed (allergies). , Disp: , Rfl:  .  pantoprazole (PROTONIX) 40 MG tablet, TAKE 1 TABLET TWICE DAILY 30 MINUTES PRIOR TO MEALS, Disp: 180 tablet, Rfl: 3 .  simvastatin (ZOCOR) 20 MG tablet, Take 20 mg by mouth every morning. , Disp: , Rfl:  .  TRADJENTA 5 MG TABS tablet, TAKE 1 TABLET EVERY DAY, Disp: 90 tablet, Rfl: 0 Allergies  Allergen Reactions  . Opana [Oxymorphone Hcl] Itching  . Tramadol Itching    reports that he quit smoking about 6 years ago. His  smoking use included Cigarettes. He has a 25.00 pack-year smoking history. He has never used smokeless tobacco. He reports that he drinks alcohol. He reports that he uses drugs, including Cocaine and Marijuana. Family History  Problem Relation Age of Onset  . Hypertension Mother        MI  . Cancer Mother        breast   . Diabetes Mother   . Diabetes Father   . Hypertension Father   . Hypertension Sister   . Diabetes Sister   . Arthritis Unknown   . Asthma Unknown   . Lung disease Unknown   . Anesthesia problems Neg Hx   . Hypotension Neg Hx   . Malignant hyperthermia Neg Hx   . Pseudochol deficiency Neg Hx   . Colon cancer Neg Hx   BP 124/78   Pulse 82   Ht 5' 4.5" (1.638 m)   Wt 237 lb (107.5 kg)   BMI 40.05 kg/m   He is well-developed and well-nourished grooming and hygiene are normal joint 3 his mood and affect are pleasant  He has of smiled limp favoring his left knee  His right knee has a well-healed incision from prior surgery and is stable and neurovascularly intact  Knee: Left Medial joint line tenderness no of edema or effusion  No warmth or joint line tenderness.   ROM normal in flexion and extension and lower leg rotation. Ligaments with solid  consistent endpoints including ACL, PCL, LCL, MCL. Negative Mcmurray's and provocative meniscal tests.  Patellar and quadriceps tendons unremarkable. Hamstring and quadriceps strength is normal.  Neurovascular exam remains intact  Encounter Diagnosis  Name Primary?  . Primary osteoarthritis of left knee Yes    Procedure note left knee injection verbal consent was obtained to inject left knee joint  Timeout was completed to confirm the site of injection  The medications used were 40 mg of Depo-Medrol and 1% lidocaine 3 cc  Anesthesia was provided by ethyl chloride and the skin was prepped with alcohol.  After cleaning the skin with alcohol a 20-gauge needle was used to inject the left knee joint. There were no complications. A sterile bandage was applied.  Continue ice, brace with a laxative sleeve, oral over-the-counter anti-inflammatory and return gel  Follow-up 6 months   Is not a real good surgical candidate as his kidney function has decreased 12% GFR

## 2017-03-23 DIAGNOSIS — E1165 Type 2 diabetes mellitus with hyperglycemia: Secondary | ICD-10-CM | POA: Diagnosis not present

## 2017-03-23 DIAGNOSIS — I10 Essential (primary) hypertension: Secondary | ICD-10-CM | POA: Diagnosis not present

## 2017-03-23 DIAGNOSIS — N184 Chronic kidney disease, stage 4 (severe): Secondary | ICD-10-CM | POA: Diagnosis not present

## 2017-03-26 ENCOUNTER — Other Ambulatory Visit (HOSPITAL_COMMUNITY): Payer: Self-pay | Admitting: Pulmonary Disease

## 2017-03-26 DIAGNOSIS — R918 Other nonspecific abnormal finding of lung field: Secondary | ICD-10-CM

## 2017-03-28 DIAGNOSIS — R809 Proteinuria, unspecified: Secondary | ICD-10-CM | POA: Diagnosis not present

## 2017-03-28 DIAGNOSIS — E1122 Type 2 diabetes mellitus with diabetic chronic kidney disease: Secondary | ICD-10-CM | POA: Diagnosis not present

## 2017-03-28 DIAGNOSIS — I10 Essential (primary) hypertension: Secondary | ICD-10-CM | POA: Diagnosis not present

## 2017-03-28 DIAGNOSIS — D509 Iron deficiency anemia, unspecified: Secondary | ICD-10-CM | POA: Diagnosis not present

## 2017-03-28 DIAGNOSIS — Z794 Long term (current) use of insulin: Secondary | ICD-10-CM | POA: Diagnosis not present

## 2017-03-28 DIAGNOSIS — E1129 Type 2 diabetes mellitus with other diabetic kidney complication: Secondary | ICD-10-CM | POA: Diagnosis not present

## 2017-03-28 DIAGNOSIS — N184 Chronic kidney disease, stage 4 (severe): Secondary | ICD-10-CM | POA: Diagnosis not present

## 2017-03-28 DIAGNOSIS — D519 Vitamin B12 deficiency anemia, unspecified: Secondary | ICD-10-CM | POA: Diagnosis not present

## 2017-03-29 LAB — RENAL FUNCTION PANEL
ALBUMIN MSPROF: 3.8 g/dL (ref 3.6–5.1)
BUN / CREAT RATIO: 9 (calc) (ref 6–22)
BUN: 44 mg/dL — ABNORMAL HIGH (ref 7–25)
CALCIUM: 8.8 mg/dL (ref 8.6–10.3)
CO2: 27 mmol/L (ref 20–32)
Chloride: 106 mmol/L (ref 98–110)
Creat: 5.03 mg/dL — ABNORMAL HIGH (ref 0.70–1.25)
Glucose, Bld: 113 mg/dL — ABNORMAL HIGH (ref 65–99)
Phosphorus: 3.3 mg/dL (ref 2.1–4.3)
Potassium: 4.3 mmol/L (ref 3.5–5.3)
SODIUM: 143 mmol/L (ref 135–146)

## 2017-03-29 LAB — HEMOGLOBIN A1C
EAG (MMOL/L): 7.4 (calc)
Hgb A1c MFr Bld: 6.3 % of total Hgb — ABNORMAL HIGH (ref <5.7)
Mean Plasma Glucose: 134 (calc)

## 2017-04-02 ENCOUNTER — Ambulatory Visit (INDEPENDENT_AMBULATORY_CARE_PROVIDER_SITE_OTHER): Payer: Medicare Other | Admitting: "Endocrinology

## 2017-04-02 ENCOUNTER — Encounter: Payer: Self-pay | Admitting: "Endocrinology

## 2017-04-02 VITALS — BP 147/77 | HR 78 | Ht 64.5 in | Wt 243.0 lb

## 2017-04-02 DIAGNOSIS — Z794 Long term (current) use of insulin: Secondary | ICD-10-CM | POA: Diagnosis not present

## 2017-04-02 DIAGNOSIS — E559 Vitamin D deficiency, unspecified: Secondary | ICD-10-CM | POA: Diagnosis not present

## 2017-04-02 DIAGNOSIS — E782 Mixed hyperlipidemia: Secondary | ICD-10-CM | POA: Diagnosis not present

## 2017-04-02 DIAGNOSIS — I2510 Atherosclerotic heart disease of native coronary artery without angina pectoris: Secondary | ICD-10-CM | POA: Diagnosis not present

## 2017-04-02 DIAGNOSIS — I10 Essential (primary) hypertension: Secondary | ICD-10-CM

## 2017-04-02 DIAGNOSIS — N184 Chronic kidney disease, stage 4 (severe): Secondary | ICD-10-CM

## 2017-04-02 DIAGNOSIS — E1122 Type 2 diabetes mellitus with diabetic chronic kidney disease: Secondary | ICD-10-CM | POA: Diagnosis not present

## 2017-04-02 NOTE — Patient Instructions (Signed)

## 2017-04-02 NOTE — Progress Notes (Signed)
Subjective:    Patient ID: [name] [name], male    DOB: 11-05-1950, PCP [name], MD   Past Medical History:  Diagnosis Date  . Anemia   . Arthritis   . Bell palsy   . CAD (coronary artery disease)    a. 2014 MV: abnl w/ infap ischemia; b. 03/2013 Cath: aneurysmal bleb in the LAD w/ otw nonobs dzs-->Med Rx.  . Chronic back pain   . Chronic knee pain    a. 09/2015 s/p R TKA.  Marland Kitchen Chronic pain   . Chronic shoulder pain   . Chronic sinusitis   . CKD (chronic kidney disease), stage IV ([place])    stage 4 per office visit note of Dr Lowanda Foster on 05/2015   . COPD (chronic obstructive pulmonary disease) ([place])   . Diabetes mellitus without complication ([place])   . Dyspnea   . Essential hypertension   . GERD (gastroesophageal reflux disease)   . Hepatomegaly    noted on noncontrast CT 2015  . History of hiatal hernia   . Hyperlipidemia   . Lateral meniscus tear   . Obesity    [place]  . Obstructive sleep apnea    cpap - settings at 4   . On home oxygen therapy    uses 2l when is going somewhere per patient   . Pneumonia   . Post traumatic stress disorder   . PUD (peptic ulcer disease)    remote, reports f/u EGD about 8 years ago unremarkable   . Reactive airway disease    related to exposure to chemical during 9/11  . Sinusitis   . Vitamin D deficiency    Past Surgical History:  Procedure Laterality Date  . ASAD LT SHOULDER  12/2008   left shoulder  . AV FISTULA PLACEMENT Left 08/09/2016   Procedure: BRACHIOCEPHALIC ARTERIOVENOUS (AV) FISTULA CREATION LEFT ARM;  Surgeon: [name], MD;  Location: [place];  Service: Vascular;  Laterality: Left;  . CATARACT EXTRACTION W/PHACO Left 03/28/2016   Procedure: CATARACT EXTRACTION PHACO AND INTRAOCULAR LENS PLACEMENT LEFT EYE;  Surgeon: [name], MD;  Location: [facility];  Service: Ophthalmology;  Laterality: Left;  CDE: 4.77  . CATARACT EXTRACTION W/PHACO Right 04/11/2016   Procedure: CATARACT EXTRACTION PHACO AND INTRAOCULAR  LENS PLACEMENT RIGHT EYE; CDE:  4.74;  Surgeon: [name], MD;  Location: [facility];  Service: Ophthalmology;  Laterality: Right;  . COLONOSCOPY  10/2008   Fields: Rectal polyp obliterated, not retrieved, hemorrhoids, single ascending colon diverticulum near the CV. Next colonoscopy April 2020  . COLONOSCOPY N/A 12/25/2014   SLF: 1. Colorectal polyps (2) removed 2. Small internal hemorrhoids 3. the left colon is severely redundant  . DOPPLER ECHOCARDIOGRAPHY    . ESOPHAGOGASTRODUODENOSCOPY N/A 12/25/2014   SLF: 1. Anemia most likely due to CRI, gastritis, gastric polyps 2. Moderate non-erosive gastriits and mild duodenitis.  3.TWo large gstric polyps removed.   Marland Kitchen EYE SURGERY  12/22/2010   tear duct probing-[place]  . FOREIGN BODY REMOVAL  03/29/2011   Procedure: REMOVAL FOREIGN BODY EXTREMITY;  Surgeon: Arther Abbott, MD;  Location: [facility];  Service: Orthopedics;  Laterality: Right;  Removal Foreign Body Right Thumb  . KNEE ARTHROSCOPY  10/2007   left  . KNEE ARTHROSCOPY WITH LATERAL MENISECTOMY Right 10/14/2015   Procedure: LEFT KNEE ARTHROSCOPY WITH PARTIAL LATERAL MENISECTOMY;  Surgeon: Carole Civil, MD;  Location: [facility];  Service: Orthopedics;  Laterality: Right;  . LEFT HEART CATHETERIZATION WITH CORONARY ANGIOGRAM N/A 03/28/2013  Procedure: LEFT HEART CATHETERIZATION WITH CORONARY ANGIOGRAM;  Surgeon: Leonie Man, MD;  Location: [facility] CATH [facility];  Service: Cardiovascular;  Laterality: N/A;  . NM MYOVIEW LTD    . PENILE PROSTHESIS IMPLANT N/A 08/16/2015   Procedure: PENILE PROTHESIS INFLATABLE, three piece, Excisional biopsy of Penile ulcer, Penile molding;  Surgeon: Carolan Clines, MD;  Location: [facility];  Service: Urology;  Laterality: N/A;  . QUADRICEPS TENDON REPAIR  07/21/2011   Procedure: REPAIR QUADRICEP TENDON;  Surgeon: Arther Abbott, MD;  Location: [facility];  Service: Orthopedics;  Laterality: Right;  . TOENAIL EXCISION     removed [id number]  . UMBILICAL HERNIA REPAIR   2007   [place]   Social History   Social History  . Marital status: Married    Spouse name: N/A  . Number of children: 2  . Years of education: 12th grade   Occupational History  . disabled   .     Marland Kitchen  Unemployed   Social History Main Topics  . Smoking status: Former Smoker    Packs/day: 1.00    Years: 25.00    Types: Cigarettes    Quit date: 03/27/2010  . Smokeless tobacco: Never Used     Comment: Quit x 7 years  . Alcohol use 0.0 oz/week     Comment: occasionally  . Drug use: Yes    Types: Cocaine, Marijuana     Comment: no cocaine for several years. Most recent use  of mairjuana was 06/2016  . Sexual activity: Not Asked   Other Topics Concern  . None   Social History Narrative   He quit smoking in 2010. 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 He developed pulmonary problems, became disabled because of lower airway disease in 2009.    Outpatient Encounter Prescriptions as of 04/02/2017  Medication Sig  . amLODipine (NORVASC) 10 MG tablet Take 10 mg by mouth daily.   Marland Kitchen aspirin EC 81 MG tablet Take 1 tablet (81 mg total) by mouth daily.  . budesonide-formoterol (SYMBICORT) 160-4.5 MCG/ACT inhaler Inhale 2 puffs into the lungs 2 (two) times daily as needed (Shortness of Breath).   . cetirizine (ZYRTEC) 10 MG tablet Take 10 mg by mouth at bedtime as needed for allergies.   . Cholecalciferol (VITAMIN D) 2000 units tablet Take 2,000 Units by mouth daily.  . diclofenac sodium (VOLTAREN) 1 % GEL Apply 4 g topically 4 (four) times daily. To left knee  . furosemide (LASIX) 40 MG tablet Take 1 tablet by mouth daily.  . hydrALAZINE (APRESOLINE) 25 MG tablet Take 25 mg by mouth 3 (three) times daily.  Marland Kitchen HYDROcodone-acetaminophen (NORCO) 7.5-325 MG tablet Take 1 tablet by mouth every 6 (six) hours as needed for moderate pain.  . Insulin Pen Needle (PEN NEEDLES 3/16") 31G X 5 MM MISC Use as directed qhs  . iron polysaccharides (NIFEREX) 150 MG  capsule Take 150 mg by mouth 2 (two) times daily.   Marland Kitchen LANTUS SOLOSTAR 100 UNIT/ML Solostar Pen INJECT 40 UNITS SUBCUTANEOUSLY AT BEDTIME.  . mometasone (NASONEX) 50 MCG/ACT nasal spray Place 2 sprays into the nose 2 (two) times daily as needed (allergies).   . pantoprazole (PROTONIX) 40 MG tablet TAKE 1 TABLET TWICE DAILY 30 MINUTES PRIOR TO MEALS  . simvastatin (ZOCOR) 20 MG tablet Take 20 mg by mouth every morning.   . TRADJENTA 5 MG TABS tablet TAKE 1 TABLET EVERY DAY   No facility-administered encounter medications on file as  of 04/02/2017.    ALLERGIES: Allergies  Allergen Reactions  . Opana [Oxymorphone Hcl] Itching  . Tramadol Itching   VACCINATION STATUS: Immunization History  Administered Date(s) Administered  . Td 03/27/2011    Diabetes  He presents for his follow-up diabetic visit. He has type 2 diabetes mellitus. Onset time: He was diagnosed approximately 20 years ago. His disease course has been stable. There are no hypoglycemic associated symptoms. Pertinent negatives for hypoglycemia include no confusion, headaches, pallor or seizures. Associated symptoms include polyuria. Pertinent negatives for diabetes include no chest pain, no fatigue, no polydipsia, no polyphagia and no weakness. There are no hypoglycemic complications. Symptoms are stable. Diabetic complications include nephropathy. Risk factors for coronary artery disease include dyslipidemia, diabetes mellitus, hypertension, obesity, sedentary lifestyle, tobacco exposure and male sex. Current diabetic treatment includes insulin injections and oral agent (monotherapy). He is compliant with treatment most of the time. His weight is stable. He is following a generally unhealthy diet. He has had a previous visit with a dietitian. He rarely participates in exercise. His breakfast blood glucose range is generally 110-130 mg/dl. His overall blood glucose range is 110-130 mg/dl. Eye exam is current.  Hypertension  This is a  chronic problem. The current episode started more than 1 year ago. The problem is controlled. Pertinent negatives include no chest pain, headaches, neck pain, palpitations or shortness of breath. Risk factors for coronary artery disease include diabetes mellitus, dyslipidemia, obesity, smoking/tobacco exposure and sedentary lifestyle. Past treatments include angiotensin blockers. Hypertensive end-organ damage includes kidney disease.  Hyperlipidemia  This is a chronic problem. The current episode started more than 1 year ago. Pertinent negatives include no chest pain, myalgias or shortness of breath. Current antihyperlipidemic treatment includes statins. Risk factors for coronary artery disease include diabetes mellitus, dyslipidemia, hypertension, male sex and a sedentary lifestyle.     Review of Systems  Constitutional: Negative for fatigue and unexpected weight change.  HENT: Negative for dental problem, mouth sores and trouble swallowing.   Eyes: Negative for visual disturbance.  Respiratory: Negative for cough, choking, chest tightness, shortness of breath and wheezing.   Cardiovascular: Negative for chest pain, palpitations and leg swelling.  Gastrointestinal: Negative for abdominal distention, abdominal pain, constipation, diarrhea, nausea and vomiting.  Endocrine: Positive for polyuria. Negative for polydipsia and polyphagia.  Genitourinary: Negative for dysuria, flank pain, hematuria and urgency.  Musculoskeletal: Negative for back pain, gait problem, myalgias and neck pain.  Skin: Negative for pallor, rash and wound.  Neurological: Negative for seizures, syncope, weakness, numbness and headaches.  Psychiatric/Behavioral: Negative.  Negative for confusion and dysphoric mood.    Objective:    BP (!) 147/77   Pulse 78   Ht 5' 4.5" (1.638 m)   Wt 243 lb (110.2 kg)   BMI 41.07 kg/m   Wt Readings from Last 3 Encounters:  04/02/17 243 lb (110.2 kg)  02/27/17 237 lb (107.5 kg)   02/07/17 242 lb (109.8 kg)    Physical Exam  Constitutional: He is oriented to person, place, and time. He appears well-developed and well-nourished. He is cooperative. No distress.  HENT:  Head: Normocephalic and atraumatic.  Eyes: EOM are normal.  Neck: Normal range of motion. Neck supple. No tracheal deviation present. No thyromegaly present.  Cardiovascular: Normal rate, S1 normal, S2 normal and normal heart sounds.  Exam reveals no gallop.   No murmur heard. Pulses:      Dorsalis pedis pulses are 1+ on the right side, and 1+ on the left side.  Posterior tibial pulses are 1+ on the right side, and 1+ on the left side.  Pulmonary/Chest: Breath sounds normal. No respiratory distress. He has no wheezes.  Abdominal: Soft. Bowel sounds are normal. He exhibits no distension. There is no tenderness. There is no guarding and no CVA tenderness.  Musculoskeletal: He exhibits no edema.       Right shoulder: He exhibits no swelling and no deformity.  Neurological: He is alert and oriented to person, place, and time. He has normal strength and normal reflexes. No cranial nerve deficit or sensory deficit. Gait normal.  Skin: Skin is warm and dry. No rash noted. No cyanosis. Nails show no clubbing.  Psychiatric: He has a normal mood and affect. His speech is normal and behavior is normal. Judgment and thought content normal. Cognition and memory are normal.    Results for orders placed or performed during the hospital encounter of 02/07/17  CBG monitoring, ED  Result Value Ref Range   Glucose-Capillary 114 (H) 65 - 99 mg/dL   Diabetic Labs (most recent): Lab Results  Component Value Date   HGBA1C 6.3 (H) 03/28/2017   HGBA1C 6.4 12/19/2016   HGBA1C 7.0 (H) 09/15/2016   Lipid Panel     Component Value Date/Time   CHOL 146 06/16/2016 0956   TRIG 190 (H) 06/16/2016 0956   HDL 36 (L) 06/16/2016 0956   CHOLHDL 4.1 06/16/2016 0956   VLDL 38 (H) 06/16/2016 0956   LDLCALC 72 06/16/2016  0956     Assessment & Plan:   1. Type 2 diabetes mellitus with stage 4 chronic kidney disease, with long-term current use of insulin (HCC)  -His  Diabetes is complicated by chronic kidney disease and patient remains at a high risk for more acute and chronic complications of diabetes which include CAD, CVA, CKD, retinopathy, and neuropathy. These are all discussed in detail with the patient.  Patient came with controlled fasting glucose profile, and  recent A1c of 6.3%, generally improving from 7.4%.   Recent labs reviewed. - I have re-counseled the patient on diet management and weight loss  by adopting a carbohydrate restricted / protein rich  Diet.  - Suggestion is made for patient to avoid simple carbohydrates   from his diet including Cakes , Desserts, Ice Cream,  Soda (  diet and regular) , Sweet Tea , Candies,  Chips, Cookies, Artificial Sweeteners,   and "Sugar-free" Products .  This will help patient to have stable blood glucose profile and potentially avoid unintended  Weight gain.  - Patient is advised to stick to a routine mealtimes to eat 3 meals  a day and avoid unnecessary snacks ( to snack only to correct hypoglycemia). I have also advised him to cut back on alcohol consumption.   - I have approached patient with the following individualized plan to manage diabetes and patient agrees.   -He is advised to stay away from ETOH and smoking. He will continue to need at least basal insulin therapy.  - I will continue Lantus 40 units daily at bedtime associated with monitoring of blood glucose 2 times a day-daily before breakfast and at bedtime. - I advised him to stay off of Humalog for now . Continue Tradjenta 5mg  po qday.   He is following with Jearld Fenton, CDE for DM education.   -He will continue to f/u with Nephrology given stage 4-5 CKD, he has a dialysis fistula in place.  -Patient is not a candidate for metformin and SGLT2 inhibitors due  to CKD.  - Patient specific  target  for A1c; LDL, HDL, Triglycerides, and  Waist Circumference were discussed in detail.  2) BP/HTN: uncontrolled. Continue current medications including ACEI/ARB. 3) Lipids/HPL:  continue statins. 4)  Weight/Diet: CDE consult in progress, exercise, and carbohydrates information provided. 5) Vitamin D deficiency - He is status post therapy with vitamin D 50,000 weekly for 12 weeks, currently on vitamin D3 5,000 units daily. His insurance would not cover for repeat vitamin D test.   6) Chronic Care/Health Maintenance:  -Patient is on ACEI/ARB and Statin medications and encouraged to continue to follow up with Ophthalmology, Podiatrist at least yearly or according to recommendations, and advised to quit smoking. I have recommended yearly flu vaccine and pneumonia vaccination at least every 5 years; moderate intensity exercise for up to 150 minutes weekly; and  sleep for at least 7 hours a day.  - - Time spent with the patient: 25 min, of which >50% was spent in reviewing his sugar logs , discussing his hypo- and hyper-glycemic episodes, reviewing his current and  previous labs and insulin doses and developing a plan to avoid hypo- and hyper-glycemia.    - I advised patient to maintain close follow up with their PCP for primary care needs. - Patient is asked to bring meter and  blood glucose logs during his next visit.   Follow up plan: -Return in about 3 months (around 07/02/2017) for meter, and logs.  Glade Lloyd, MD Phone: [phone]  Fax: [phone]   This note was partially dictated with voice recognition software. Similar sounding words can be transcribed inadequately or may not  be corrected upon review.  04/02/2017, 12:26 PM

## 2017-04-03 ENCOUNTER — Ambulatory Visit: Payer: Medicare Other | Admitting: "Endocrinology

## 2017-04-10 ENCOUNTER — Ambulatory Visit (HOSPITAL_COMMUNITY)
Admission: RE | Admit: 2017-04-10 | Discharge: 2017-04-10 | Disposition: A | Discharge status: Home / Self Care | Payer: Medicare Other | Source: Ambulatory Visit | Attending: Pulmonary Disease | Admitting: Pulmonary Disease

## 2017-04-10 DIAGNOSIS — I2510 Atherosclerotic heart disease of native coronary artery without angina pectoris: Secondary | ICD-10-CM | POA: Insufficient documentation

## 2017-04-10 DIAGNOSIS — R809 Proteinuria, unspecified: Secondary | ICD-10-CM | POA: Diagnosis not present

## 2017-04-10 DIAGNOSIS — R911 Solitary pulmonary nodule: Secondary | ICD-10-CM | POA: Diagnosis not present

## 2017-04-10 DIAGNOSIS — J984 Other disorders of lung: Secondary | ICD-10-CM | POA: Insufficient documentation

## 2017-04-10 DIAGNOSIS — E1129 Type 2 diabetes mellitus with other diabetic kidney complication: Secondary | ICD-10-CM | POA: Diagnosis not present

## 2017-04-10 DIAGNOSIS — I517 Cardiomegaly: Secondary | ICD-10-CM | POA: Insufficient documentation

## 2017-04-10 DIAGNOSIS — I700 Atherosclerosis of aorta: Secondary | ICD-10-CM | POA: Diagnosis not present

## 2017-04-10 DIAGNOSIS — I313 Pericardial effusion (noninflammatory): Secondary | ICD-10-CM | POA: Insufficient documentation

## 2017-04-10 DIAGNOSIS — N185 Chronic kidney disease, stage 5: Secondary | ICD-10-CM | POA: Diagnosis not present

## 2017-04-10 DIAGNOSIS — R918 Other nonspecific abnormal finding of lung field: Secondary | ICD-10-CM

## 2017-04-10 DIAGNOSIS — I10 Essential (primary) hypertension: Secondary | ICD-10-CM | POA: Diagnosis not present

## 2017-04-18 ENCOUNTER — Encounter: Payer: Self-pay | Admitting: Gastroenterology

## 2017-04-19 ENCOUNTER — Other Ambulatory Visit: Payer: Self-pay | Admitting: "Endocrinology

## 2017-04-24 DIAGNOSIS — B351 Tinea unguium: Secondary | ICD-10-CM | POA: Diagnosis not present

## 2017-04-24 DIAGNOSIS — L851 Acquired keratosis [keratoderma] palmaris et plantaris: Secondary | ICD-10-CM | POA: Diagnosis not present

## 2017-04-24 DIAGNOSIS — E1142 Type 2 diabetes mellitus with diabetic polyneuropathy: Secondary | ICD-10-CM | POA: Diagnosis not present

## 2017-06-05 DIAGNOSIS — R809 Proteinuria, unspecified: Secondary | ICD-10-CM | POA: Diagnosis not present

## 2017-06-05 DIAGNOSIS — E559 Vitamin D deficiency, unspecified: Secondary | ICD-10-CM | POA: Diagnosis not present

## 2017-06-05 DIAGNOSIS — I10 Essential (primary) hypertension: Secondary | ICD-10-CM | POA: Diagnosis not present

## 2017-06-05 DIAGNOSIS — Z79899 Other long term (current) drug therapy: Secondary | ICD-10-CM | POA: Diagnosis not present

## 2017-06-05 DIAGNOSIS — D509 Iron deficiency anemia, unspecified: Secondary | ICD-10-CM | POA: Diagnosis not present

## 2017-06-05 DIAGNOSIS — N183 Chronic kidney disease, stage 3 (moderate): Secondary | ICD-10-CM | POA: Diagnosis not present

## 2017-06-08 ENCOUNTER — Other Ambulatory Visit: Payer: Self-pay | Admitting: Vascular Surgery

## 2017-06-12 DIAGNOSIS — E1129 Type 2 diabetes mellitus with other diabetic kidney complication: Secondary | ICD-10-CM | POA: Diagnosis not present

## 2017-06-12 DIAGNOSIS — R809 Proteinuria, unspecified: Secondary | ICD-10-CM | POA: Diagnosis not present

## 2017-06-12 DIAGNOSIS — N185 Chronic kidney disease, stage 5: Secondary | ICD-10-CM | POA: Diagnosis not present

## 2017-06-21 ENCOUNTER — Ambulatory Visit (INDEPENDENT_AMBULATORY_CARE_PROVIDER_SITE_OTHER): Payer: Medicare Other | Admitting: Gastroenterology

## 2017-06-21 ENCOUNTER — Encounter: Payer: Self-pay | Admitting: Gastroenterology

## 2017-06-21 DIAGNOSIS — I2510 Atherosclerotic heart disease of native coronary artery without angina pectoris: Secondary | ICD-10-CM | POA: Diagnosis not present

## 2017-06-21 DIAGNOSIS — K760 Fatty (change of) liver, not elsewhere classified: Secondary | ICD-10-CM

## 2017-06-21 DIAGNOSIS — K219 Gastro-esophageal reflux disease without esophagitis: Secondary | ICD-10-CM | POA: Diagnosis not present

## 2017-06-21 NOTE — Progress Notes (Signed)
CC'ED TO PCP 

## 2017-06-21 NOTE — Assessment & Plan Note (Signed)
WEIGHT STABLE.  DRINK WATER TO KEEP YOUR URINE LIGHT YELLOW. CONTINUE YOUR WEIGHT LOSS EFFORTS. FOLLOW UP IN 6 MOS.

## 2017-06-21 NOTE — Progress Notes (Signed)
ON RECALL  °

## 2017-06-21 NOTE — Assessment & Plan Note (Signed)
SYMPTOMS CONTROLLED/RESOLVED.  DRINK WATER TO KEEP YOUR URINE LIGHT YELLOW. CONTINUE YOUR WEIGHT LOSS EFFORTS. AVOID REFLUX TRIGGERS.  REDUCE PROTONIX. TAKE 30 MINUTES PRIOR TO BREAKFAST DAILY. FOLLOW UP IN 6 MOS.

## 2017-06-21 NOTE — Patient Instructions (Signed)
DRINK WATER TO KEEP YOUR URINE LIGHT YELLOW.  CONTINUE YOUR WEIGHT LOSS EFFORTS. A WEIGHT OF 170 LBS   WILL GET YOUR BODY MASS INDEX(BMI) UNDER 30.   AVOID REFLUX TRIGGERS. SEE INFO BELOW.   REDUCE PROTONIX. TAKE 30 MINUTES PRIOR TO BREAKFAST DAILY.  I WILL OBTAIN LABS FROM DR. BEFAKADU.  FOLLOW UP IN 6 MOS.      Lifestyle and home remedies TO CONTROL REFLUX/HEARTBURN  You may eliminate or reduce the frequency of heartburn by making the following lifestyle changes:  . Control your weight. Being overweight is a major risk factor for heartburn and GERD. Excess pounds put pressure on your abdomen, pushing up your stomach and causing acid to back up into your esophagus.   . Eat smaller meals. 4 TO 6 MEALS A DAY. This reduces pressure on the lower esophageal sphincter, helping to prevent the valve from opening and acid from washing back into your esophagus.   Dolphus Jenny your belt. Clothes that fit tightly around your waist put pressure on your abdomen and the lower esophageal sphincter.   . Eliminate heartburn triggers. Everyone has specific triggers. Common triggers such as fatty or fried foods, spicy food, tomato sauce, carbonated beverages, alcohol, chocolate, mint, garlic, onion, caffeine and nicotine may make heartburn worse.   Marland Kitchen Avoid stooping or bending. Tying your shoes is OK. Bending over for longer periods to weed your garden isn't, especially soon after eating.   . Don't lie down after a meal. Wait at least three to four hours after eating before going to bed, and don't lie down right after eating.    Alternative medicine . Several home remedies exist for treating GERD, but they provide only temporary relief. They include drinking baking soda (sodium bicarbonate) added to water or drinking other fluids such as baking soda mixed with cream of tartar and water.  . Although these liquids create temporary relief by neutralizing, washing away or buffering acids, eventually they  aggravate the situation by adding gas and fluid to your stomach, increasing pressure and causing more acid reflux. Further, adding more sodium to your diet may increase your blood pressure and add stress to your heart, and excessive bicarbonate ingestion can alter the acid-base balance in your body.

## 2017-06-21 NOTE — Assessment & Plan Note (Signed)
BMI > 40 WEIGHT UNCHANGED SINCE MAY 2018.  DRINK WATER TO KEEP YOUR URINE LIGHT YELLOW. CONTINUE YOUR WEIGHT LOSS EFFORTS. A WEIGHT OF 170  WILL GET YOUR BODY MASS INDEX(BMI) UNDER 30. FOLLOW UP IN 6 MOS.

## 2017-06-21 NOTE — Progress Notes (Signed)
Subjective:    Patient ID: [name] [name], male    DOB: 09/30/1950, 66 y.o.   MRN: [id number]  [name], MD   HPI No questions or concerns. BMs: EVERY DAY.  PT DENIES FEVER, CHILLS, HEMATOCHEZIA, nausea, vomiting, melena, diarrhea, CHEST PAIN, SHORTNESS OF BREATH,  CHANGE IN BOWEL IN HABITS, constipation, abdominal pain, problems swallowing, OR heartburn or indigestion.  Past Medical History:  Diagnosis Date  . Anemia   . Arthritis   . Bell palsy   . CAD (coronary artery disease)    a. 2014 MV: abnl w/ infap ischemia; b. 03/2013 Cath: aneurysmal bleb in the LAD w/ otw nonobs dzs-->Med Rx.  . Chronic back pain   . Chronic knee pain    a. 09/2015 s/p R TKA.  Marland Kitchen Chronic pain   . Chronic shoulder pain   . Chronic sinusitis   . CKD (chronic kidney disease), stage IV ([place])    stage 4 per office visit note of Dr Lowanda Foster on 05/2015   . COPD (chronic obstructive pulmonary disease) ([place])   . Diabetes mellitus without complication ([place])   . Dyspnea   . Essential hypertension   . GERD (gastroesophageal reflux disease)   . Hepatomegaly    noted on noncontrast CT 2015  . History of hiatal hernia   . Hyperlipidemia   . Lateral meniscus tear   . Obesity    Truncal  . Obstructive sleep apnea    cpap - settings at 4   . On home oxygen therapy    uses 2l when is going somewhere per patient   . Pneumonia   . Post traumatic stress disorder   . PUD (peptic ulcer disease)    remote, reports f/u EGD about 8 years ago unremarkable   . Reactive airway disease    related to exposure to chemical during 9/11  . Sinusitis   . Vitamin D deficiency    Past Surgical History:  Procedure Laterality Date  . ASAD LT SHOULDER  12/2008   left shoulder  . AV FISTULA PLACEMENT Left 08/09/2016   Procedure: BRACHIOCEPHALIC ARTERIOVENOUS (AV) FISTULA CREATION LEFT ARM;  Surgeon: [name], MD;  Location: [facility];  Service: Vascular;  Laterality: Left;  . CATARACT EXTRACTION W/PHACO Left  03/28/2016   Procedure: CATARACT EXTRACTION PHACO AND INTRAOCULAR LENS PLACEMENT LEFT EYE;  Surgeon: [name], MD;  [facility];  Service: Ophthalmology;  Laterality: Left;  CDE: 4.77  . CATARACT EXTRACTION W/PHACO Right 04/11/2016   Procedure: CATARACT EXTRACTION PHACO AND INTRAOCULAR LENS PLACEMENT RIGHT EYE; CDE:  4.74;  Surgeon: [name], MD;  [facility]: [facility];  Service: Ophthalmology;  Laterality: Right;  . COLONOSCOPY  10/2008   Mehkai Gallo: Rectal polyp obliterated, not retrieved, hemorrhoids, single ascending colon diverticulum near the CV. Next colonoscopy April 2020  . COLONOSCOPY N/A 12/25/2014   SLF: 1. Colorectal polyps (2) removed 2. Small internal hemorrhoids 3. the left colon is severely redundant  . DOPPLER ECHOCARDIOGRAPHY    . ESOPHAGOGASTRODUODENOSCOPY N/A 12/25/2014   SLF: 1. Anemia most likely due to CRI, gastritis, gastric polyps 2. Moderate non-erosive gastriits and mild duodenitis.  3.TWo large gstric polyps removed.   Marland Kitchen EYE SURGERY  12/22/2010   tear duct probing-[place]  . FOREIGN BODY REMOVAL  03/29/2011   Procedure: REMOVAL FOREIGN BODY EXTREMITY;  Surgeon: Arther Abbott, MD;  Location: [facility];  Service: Orthopedics;  Laterality: Right;  Removal Foreign Body Right Thumb  . KNEE ARTHROSCOPY  10/2007   left  .  KNEE ARTHROSCOPY WITH LATERAL MENISECTOMY Right 10/14/2015   Procedure: LEFT KNEE ARTHROSCOPY WITH PARTIAL LATERAL MENISECTOMY;  Surgeon: Carole Civil, MD;  [facility];  Service: Orthopedics;  Laterality: Right;  . LEFT HEART CATHETERIZATION WITH CORONARY ANGIOGRAM N/A 03/28/2013   Procedure: LEFT HEART CATHETERIZATION WITH CORONARY ANGIOGRAM;  Surgeon: Leonie Man, MD;  Location: [facility] CATH LAB;  Service: Cardiovascular;  Laterality: N/A;  . NM MYOVIEW LTD    . PENILE PROSTHESIS IMPLANT N/A 08/16/2015   Procedure: PENILE PROTHESIS INFLATABLE, three piece, Excisional biopsy of Penile ulcer, Penile molding;  Surgeon: Carolan Clines, MD;   [facility];  Service: Urology;  Laterality: N/A;  . QUADRICEPS TENDON REPAIR  07/21/2011   Procedure: REPAIR QUADRICEP TENDON;  Surgeon: Arther Abbott, MD;  [facility];  Service: Orthopedics;  Laterality: Right;  . TOENAIL EXCISION     removed [id number]  . UMBILICAL HERNIA REPAIR  2007   [place]   Allergies  Allergen Reactions  . Opana [Oxymorphone Hcl] Itching  . Tramadol Itching   Current Outpatient Medications  Medication Sig Dispense Refill  . amLODipine (NORVASC) 10 MG tablet Take 10 mg by mouth daily.     Marland Kitchen aspirin EC 81 MG tablet Take 1 tablet (81 mg total) by mouth daily. 90 tablet 3  . budesonide-formoterol (SYMBICORT) 160-4.5 MCG/ACT inhaler Inhale 2 puffs into the lungs 2 (two) times daily as needed (Shortness of Breath).     . cetirizine (ZYRTEC) 10 MG tablet Take 10 mg by mouth at bedtime as needed for allergies.     . Cholecalciferol (VITAMIN D) 2000 units tablet Take 2,000 Units by mouth daily.    . diclofenac sodium (VOLTAREN) 1 % GEL Apply 4 g topically 4 (four) times daily. To left knee    . furosemide (LASIX) 40 MG tablet Take 1 tablet by mouth daily.    . hydrALAZINE (APRESOLINE) 25 MG tablet Take 25 mg by mouth 3 (three) times daily.    Marland Kitchen HYDROcodone-acetaminophen (NORCO) 7.5-325 MG tablet Take 1 tablet by mouth every 6 (six) hours as needed for moderate pain.    . Insulin Pen Needle (PEN NEEDLES 3/16") 31G X 5 MM MISC Use as directed qhs    . LANTUS SOLOSTAR 100 UNIT/ML Solostar Pen INJECT 40 UNITS SUBCUTANEOUSLY AT BEDTIME.    . mometasone (NASONEX) 50 MCG/ACT nasal spray Place 2 sprays into the nose 2 (two) times daily as needed (allergies).     . pantoprazole (PROTONIX) 40 MG tablet TAKE 1 TABLET TWICE DAILY 30 MINUTES PRIOR TO MEALS    . simvastatin (ZOCOR) 20 MG tablet Take 20 mg by mouth every morning.     . TRADJENTA 5 MG TABS tablet TAKE 1 TABLET EVERY DAY    . iron polysaccharides (NIFEREX) 150 MG capsule Take 150 mg by mouth 2 (two) times  daily.      Review of Systems PER HPI OTHERWISE ALL SYSTEMS ARE NEGATIVE.    Objective:   Physical Exam  Constitutional: He is oriented to person, place, and time. He appears well-developed and well-nourished. No distress.  HENT:  Head: Normocephalic and atraumatic.  Mouth/Throat: Oropharynx is clear and moist. No oropharyngeal exudate.  Eyes: Pupils are equal, round, and reactive to light. No scleral icterus.  Neck: Normal range of motion. Neck supple.  Cardiovascular: Normal rate, regular rhythm and normal heart sounds.  Pulmonary/Chest: Effort normal and breath sounds normal. No respiratory distress.  Abdominal: Soft. Bowel sounds are normal. He exhibits no distension. There  is no tenderness.  Musculoskeletal: He exhibits no edema.  Lymphadenopathy:    He has no cervical adenopathy.  Neurological: He is alert and oriented to person, place, and time.  NO FOCAL DEFICITS  Psychiatric: He has a normal mood and affect.  Vitals reviewed.     Assessment & Plan:

## 2017-06-21 NOTE — Assessment & Plan Note (Signed)
LAST Hb IN CHL > 6 MOS.  OBTAIN LABS FROM DR. BEFAKADU. FOLLOW UP IN 6 MOS.

## 2017-06-26 ENCOUNTER — Other Ambulatory Visit: Payer: Self-pay | Admitting: "Endocrinology

## 2017-06-26 DIAGNOSIS — E782 Mixed hyperlipidemia: Secondary | ICD-10-CM | POA: Diagnosis not present

## 2017-06-26 DIAGNOSIS — Z794 Long term (current) use of insulin: Secondary | ICD-10-CM | POA: Diagnosis not present

## 2017-06-26 DIAGNOSIS — N184 Chronic kidney disease, stage 4 (severe): Secondary | ICD-10-CM | POA: Diagnosis not present

## 2017-06-26 DIAGNOSIS — E1122 Type 2 diabetes mellitus with diabetic chronic kidney disease: Secondary | ICD-10-CM | POA: Diagnosis not present

## 2017-06-27 LAB — COMPLETE METABOLIC PANEL WITH GFR
AG Ratio: 1.5 (calc) (ref 1.0–2.5)
ALBUMIN MSPROF: 4.2 g/dL (ref 3.6–5.1)
ALKALINE PHOSPHATASE (APISO): 42 U/L (ref 40–115)
ALT: 14 U/L (ref 9–46)
AST: 20 U/L (ref 10–35)
BUN / CREAT RATIO: 8 (calc) (ref 6–22)
BUN: 47 mg/dL — AB (ref 7–25)
CHLORIDE: 106 mmol/L (ref 98–110)
CO2: 29 mmol/L (ref 20–32)
CREATININE: 5.76 mg/dL — AB (ref 0.70–1.25)
Calcium: 9.1 mg/dL (ref 8.6–10.3)
GFR, Est African American: 11 mL/min/{1.73_m2} — ABNORMAL LOW (ref >=60)
GFR, Est Non African American: 9 mL/min/{1.73_m2} — ABNORMAL LOW (ref >=60)
GLUCOSE: 81 mg/dL (ref 65–99)
Globulin: 2.8 g/dL (calc) (ref 1.9–3.7)
Potassium: 4.4 mmol/L (ref 3.5–5.3)
Sodium: 143 mmol/L (ref 135–146)
TOTAL PROTEIN: 7 g/dL (ref 6.1–8.1)
Total Bilirubin: 0.4 mg/dL (ref 0.2–1.2)

## 2017-06-27 LAB — LIPID PANEL
CHOL/HDL RATIO: 2.9 (calc) (ref <5.0)
CHOLESTEROL: 110 mg/dL (ref <200)
HDL: 38 mg/dL — ABNORMAL LOW (ref >40)
LDL CHOLESTEROL (CALC): 54 mg/dL
Non-HDL Cholesterol (Calc): 72 mg/dL (calc) (ref <130)
TRIGLYCERIDES: 101 mg/dL (ref <150)

## 2017-06-27 LAB — HEMOGLOBIN A1C
EAG (MMOL/L): 7.1 (calc)
HEMOGLOBIN A1C: 6.1 %{Hb} — AB (ref <5.7)
Mean Plasma Glucose: 128 (calc)

## 2017-06-27 LAB — MICROALBUMIN / CREATININE URINE RATIO
CREATININE, URINE: 124 mg/dL (ref 20–320)
MICROALB UR: 57 mg/dL
Microalb Creat Ratio: 460 mcg/mg creat — ABNORMAL HIGH (ref <30)

## 2017-06-29 DIAGNOSIS — E1165 Type 2 diabetes mellitus with hyperglycemia: Secondary | ICD-10-CM | POA: Diagnosis not present

## 2017-06-29 DIAGNOSIS — I10 Essential (primary) hypertension: Secondary | ICD-10-CM | POA: Diagnosis not present

## 2017-06-29 DIAGNOSIS — N184 Chronic kidney disease, stage 4 (severe): Secondary | ICD-10-CM | POA: Diagnosis not present

## 2017-07-02 ENCOUNTER — Encounter: Payer: Self-pay | Admitting: "Endocrinology

## 2017-07-02 ENCOUNTER — Ambulatory Visit (INDEPENDENT_AMBULATORY_CARE_PROVIDER_SITE_OTHER): Payer: Medicare Other | Admitting: "Endocrinology

## 2017-07-02 VITALS — BP 134/74 | HR 84 | Ht 64.0 in | Wt 248.0 lb

## 2017-07-02 DIAGNOSIS — E1122 Type 2 diabetes mellitus with diabetic chronic kidney disease: Secondary | ICD-10-CM | POA: Diagnosis not present

## 2017-07-02 DIAGNOSIS — I10 Essential (primary) hypertension: Secondary | ICD-10-CM

## 2017-07-02 DIAGNOSIS — Z794 Long term (current) use of insulin: Secondary | ICD-10-CM

## 2017-07-02 DIAGNOSIS — E782 Mixed hyperlipidemia: Secondary | ICD-10-CM | POA: Diagnosis not present

## 2017-07-02 DIAGNOSIS — I2510 Atherosclerotic heart disease of native coronary artery without angina pectoris: Secondary | ICD-10-CM

## 2017-07-02 DIAGNOSIS — N184 Chronic kidney disease, stage 4 (severe): Secondary | ICD-10-CM | POA: Diagnosis not present

## 2017-07-02 NOTE — Patient Instructions (Signed)

## 2017-07-02 NOTE — Progress Notes (Signed)
Subjective:    Patient ID: [name] [name], male    DOB: [birth year], PCP [name], MD   Past Medical History:  Diagnosis Date  . Anemia   . Arthritis   . Bell palsy   . CAD (coronary artery disease)    a. 2014 MV: abnl w/ infap ischemia; b. 03/2013 Cath: aneurysmal bleb in the LAD w/ otw nonobs dzs-->Med Rx.  . Chronic back pain   . Chronic knee pain    a. 09/2015 s/p R TKA.  Marland Kitchen Chronic pain   . Chronic shoulder pain   . Chronic sinusitis   . CKD (chronic kidney disease), stage IV ([place])    stage 4 per office visit note of Dr Lowanda Foster on 05/2015   . COPD (chronic obstructive pulmonary disease) ([place])   . Diabetes mellitus without complication ([place])   . Dyspnea   . Essential hypertension   . GERD (gastroesophageal reflux disease)   . Hepatomegaly    noted on noncontrast CT 2015  . History of hiatal hernia   . Hyperlipidemia   . Lateral meniscus tear   . Obesity    Truncal  . Obstructive sleep apnea    cpap - settings at 4   . On home oxygen therapy    uses 2l when is going somewhere per patient   . Pneumonia   . Post traumatic stress disorder   . PUD (peptic ulcer disease)    remote, reports f/u EGD about 8 years ago unremarkable   . Reactive airway disease    related to exposure to chemical during 9/11  . Sinusitis   . Vitamin D deficiency    Past Surgical History:  Procedure Laterality Date  . ASAD LT SHOULDER  12/2008   left shoulder  . AV FISTULA PLACEMENT Left 08/09/2016   Procedure: BRACHIOCEPHALIC ARTERIOVENOUS (AV) FISTULA CREATION LEFT ARM;  Surgeon: [name], MD;  Location: [place];  Service: Vascular;  Laterality: Left;  . CATARACT EXTRACTION W/PHACO Left 03/28/2016   Procedure: CATARACT EXTRACTION PHACO AND INTRAOCULAR LENS PLACEMENT LEFT EYE;  Surgeon: [name], MD;  [facility];  Service: Ophthalmology;  Laterality: Left;  CDE: 4.77  . CATARACT EXTRACTION W/PHACO Right 04/11/2016   Procedure: CATARACT EXTRACTION PHACO AND INTRAOCULAR  LENS PLACEMENT RIGHT EYE; CDE:  4.74;  Surgeon: [name], MD;  [facility];  Service: Ophthalmology;  Laterality: Right;  . COLONOSCOPY  10/2008   Fields: Rectal polyp obliterated, not retrieved, hemorrhoids, single ascending colon diverticulum near the CV. Next colonoscopy April 2020  . COLONOSCOPY N/A 12/25/2014   SLF: 1. Colorectal polyps (2) removed 2. Small internal hemorrhoids 3. the left colon is severely redundant  . DOPPLER ECHOCARDIOGRAPHY    . ESOPHAGOGASTRODUODENOSCOPY N/A 12/25/2014   SLF: 1. Anemia most likely due to CRI, gastritis, gastric polyps 2. Moderate non-erosive gastriits and mild duodenitis.  3.TWo large gstric polyps removed.   Marland Kitchen EYE SURGERY  12/22/2010   tear duct probing-[place]  . FOREIGN BODY REMOVAL  03/29/2011   Procedure: REMOVAL FOREIGN BODY EXTREMITY;  Surgeon: Arther Abbott, MD;  [facility];  Service: Orthopedics;  Laterality: Right;  Removal Foreign Body Right Thumb  . KNEE ARTHROSCOPY  10/2007   left  . KNEE ARTHROSCOPY WITH LATERAL MENISECTOMY Right 10/14/2015   Procedure: LEFT KNEE ARTHROSCOPY WITH PARTIAL LATERAL MENISECTOMY;  Surgeon: Carole Civil, MD;  [facility];  Service: Orthopedics;  Laterality: Right;  . LEFT HEART CATHETERIZATION WITH CORONARY ANGIOGRAM N/A 03/28/2013  Procedure: LEFT HEART CATHETERIZATION WITH CORONARY ANGIOGRAM;  Surgeon: Leonie Man, MD;  Location: [facility] CATH LAB;  Service: Cardiovascular;  Laterality: N/A;  . NM MYOVIEW LTD    . PENILE PROSTHESIS IMPLANT N/A 08/16/2015   Procedure: PENILE PROTHESIS INFLATABLE, three piece, Excisional biopsy of Penile ulcer, Penile molding;  Surgeon: Carolan Clines, MD;  [facility];  Service: Urology;  Laterality: N/A;  . QUADRICEPS TENDON REPAIR  07/21/2011   Procedure: REPAIR QUADRICEP TENDON;  Surgeon: Arther Abbott, MD;  [facility];  Service: Orthopedics;  Laterality: Right;  . TOENAIL EXCISION     removed [id number]  . UMBILICAL HERNIA REPAIR   2007   [place]   Social History   Socioeconomic History  . Marital status: Married    Spouse name: None  . Number of children: 2  . Years of education: 12th grade  . Highest education level: None  Social Needs  . Financial resource strain: None  . Food insecurity - worry: None  . Food insecurity - inability: None  . Transportation needs - medical: None  . Transportation needs - non-medical: None  Occupational History  . Occupation: disabled  . Occupation:      Employer: UNEMPLOYED  Tobacco Use  . Smoking status: Former Smoker    Packs/day: 1.00    Years: 25.00    Pack years: 25.00    Types: Cigarettes    Last attempt to quit: 03/27/2010    Years since quitting: 7.2  . Smokeless tobacco: Never Used  . Tobacco comment: Quit x 7 years  Substance and Sexual Activity  . Alcohol use: Yes    Alcohol/week: 0.0 oz    Comment: occasionally  . Drug use: Yes    Types: Cocaine, Marijuana    Comment: no cocaine for several years. Marijuana occ 06/21/17  . Sexual activity: None  Other Topics Concern  . None  Social History Narrative   He quit smoking in 2010. 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 He developed pulmonary problems, became disabled because of lower airway disease in 2009.       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   Outpatient Encounter Medications as of 07/02/2017  Medication Sig  . amLODipine (NORVASC) 10 MG tablet Take 10 mg by mouth daily.   Marland Kitchen aspirin EC 81 MG tablet Take 1 tablet (81 mg total) by mouth daily.  . budesonide-formoterol (SYMBICORT) 160-4.5 MCG/ACT inhaler Inhale 2 puffs into the lungs 2 (two) times daily as needed (Shortness of Breath).   . cetirizine (ZYRTEC) 10 MG tablet Take 10 mg by mouth at bedtime as needed for allergies.   . Cholecalciferol (VITAMIN D) 2000 units tablet Take 2,000 Units by mouth daily.  . diclofenac sodium (VOLTAREN) 1 % GEL Apply 4 g topically 4 (four) times daily. To left knee   . furosemide (LASIX) 40 MG tablet Take 1 tablet by mouth daily.  . hydrALAZINE (APRESOLINE) 25 MG tablet Take 25 mg by mouth 3 (three) times daily.  Marland Kitchen HYDROcodone-acetaminophen (NORCO) 7.5-325 MG tablet Take 1 tablet by mouth every 6 (six) hours as needed for moderate pain.  . Insulin Pen Needle (PEN NEEDLES 3/16") 31G X 5 MM MISC Use as directed qhs  . iron polysaccharides (NIFEREX) 150 MG capsule Take 150 mg by mouth 2 (two) times daily.   Marland Kitchen LANTUS SOLOSTAR 100 UNIT/ML Solostar Pen INJECT 40 UNITS SUBCUTANEOUSLY AT BEDTIME.  . mometasone (NASONEX) 50 MCG/ACT nasal spray Place 2  sprays into the nose 2 (two) times daily as needed (allergies).   . pantoprazole (PROTONIX) 40 MG tablet TAKE 1 TABLET TWICE DAILY 30 MINUTES PRIOR TO MEALS  . simvastatin (ZOCOR) 20 MG tablet Take 20 mg by mouth every morning.   . TRADJENTA 5 MG TABS tablet TAKE 1 TABLET EVERY DAY   No facility-administered encounter medications on file as of 07/02/2017.    ALLERGIES: Allergies  Allergen Reactions  . Opana [Oxymorphone Hcl] Itching  . Tramadol Itching   VACCINATION STATUS: Immunization History  Administered Date(s) Administered  . Td 03/27/2011    Diabetes  He presents for his follow-up diabetic visit. He has type 2 diabetes mellitus. Onset time: He was diagnosed approximately 20 years ago. His disease course has been improving. There are no hypoglycemic associated symptoms. Pertinent negatives for hypoglycemia include no confusion, headaches, pallor or seizures. Associated symptoms include polyuria. Pertinent negatives for diabetes include no chest pain, no fatigue, no polydipsia, no polyphagia and no weakness. There are no hypoglycemic complications. Symptoms are improving. Diabetic complications include nephropathy. Risk factors for coronary artery disease include dyslipidemia, diabetes mellitus, hypertension, obesity, sedentary lifestyle, tobacco exposure and male sex. Current diabetic treatment includes  insulin injections and oral agent (monotherapy). He is compliant with treatment most of the time. His weight is stable. He is following a generally unhealthy diet. He has had a previous visit with a dietitian. He rarely participates in exercise. His breakfast blood glucose range is generally 110-130 mg/dl. His overall blood glucose range is 110-130 mg/dl. Eye exam is current.  Hypertension  This is a chronic problem. The current episode started more than 1 year ago. The problem is controlled. Pertinent negatives include no chest pain, headaches, neck pain, palpitations or shortness of breath. Risk factors for coronary artery disease include diabetes mellitus, dyslipidemia, obesity, smoking/tobacco exposure and sedentary lifestyle. Past treatments include angiotensin blockers. Hypertensive end-organ damage includes kidney disease.  Hyperlipidemia  This is a chronic problem. The current episode started more than 1 year ago. Pertinent negatives include no chest pain, myalgias or shortness of breath. Current antihyperlipidemic treatment includes statins. Risk factors for coronary artery disease include diabetes mellitus, dyslipidemia, hypertension, male sex and a sedentary lifestyle.     Review of Systems  Constitutional: Negative for fatigue and unexpected weight change.  HENT: Negative for dental problem, mouth sores and trouble swallowing.   Eyes: Negative for visual disturbance.  Respiratory: Negative for cough, choking, chest tightness, shortness of breath and wheezing.   Cardiovascular: Negative for chest pain, palpitations and leg swelling.  Gastrointestinal: Negative for abdominal distention, abdominal pain, constipation, diarrhea, nausea and vomiting.  Endocrine: Positive for polyuria. Negative for polydipsia and polyphagia.  Genitourinary: Negative for dysuria, flank pain, hematuria and urgency.  Musculoskeletal: Negative for back pain, gait problem, myalgias and neck pain.  Skin: Negative  for pallor, rash and wound.  Neurological: Negative for seizures, syncope, weakness, numbness and headaches.  Psychiatric/Behavioral: Negative.  Negative for confusion and dysphoric mood.    Objective:    BP 134/74   Pulse 84   Ht 5\' 4"  (1.626 m)   Wt 248 lb (112.5 kg)   BMI 42.57 kg/m   Wt Readings from Last 3 Encounters:  07/02/17 248 lb (112.5 kg)  06/21/17 243 lb (110.2 kg)  04/02/17 243 lb (110.2 kg)    Physical Exam  Constitutional: He is oriented to person, place, and time. He appears well-developed and well-nourished. He is cooperative. No distress.  HENT:  Head: Normocephalic and  atraumatic.  Eyes: EOM are normal.  Neck: Normal range of motion. Neck supple. No tracheal deviation present. No thyromegaly present.  Cardiovascular: Normal rate, S1 normal, S2 normal and normal heart sounds. Exam reveals no gallop.  No murmur heard. Pulses:      Dorsalis pedis pulses are 1+ on the right side, and 1+ on the left side.       Posterior tibial pulses are 1+ on the right side, and 1+ on the left side.  Pulmonary/Chest: Breath sounds normal. No respiratory distress. He has no wheezes.  Abdominal: Soft. Bowel sounds are normal. He exhibits no distension. There is no tenderness. There is no guarding and no CVA tenderness.  Musculoskeletal: He exhibits no edema.       Right shoulder: He exhibits no swelling and no deformity.  Neurological: He is alert and oriented to person, place, and time. He has normal strength and normal reflexes. No cranial nerve deficit or sensory deficit. Gait normal.  Skin: Skin is warm and dry. No rash noted. No cyanosis. Nails show no clubbing.  Psychiatric: He has a normal mood and affect. His speech is normal and behavior is normal. Judgment and thought content normal. Cognition and memory are normal.    Results for orders placed or performed in visit on 04/02/17  COMPLETE METABOLIC PANEL WITH GFR  Result Value Ref Range   Glucose, Bld 81 65 - 99 mg/dL    BUN 47 (H) 7 - 25 mg/dL   Creat 5.76 (H) 0.70 - 1.25 mg/dL   GFR, Est Non African American 9 (L) > OR = 60 mL/min/1.20m2   GFR, Est African American 11 (L) > OR = 60 mL/min/1.31m2   BUN/Creatinine Ratio 8 6 - 22 (calc)   Sodium 143 135 - 146 mmol/L   Potassium 4.4 3.5 - 5.3 mmol/L   Chloride 106 98 - 110 mmol/L   CO2 29 20 - 32 mmol/L   Calcium 9.1 8.6 - 10.3 mg/dL   Total Protein 7.0 6.1 - 8.1 g/dL   Albumin 4.2 3.6 - 5.1 g/dL   Globulin 2.8 1.9 - 3.7 g/dL (calc)   AG Ratio 1.5 1.0 - 2.5 (calc)   Total Bilirubin 0.4 0.2 - 1.2 mg/dL   Alkaline phosphatase (APISO) 42 40 - 115 U/L   AST 20 10 - 35 U/L   ALT 14 9 - 46 U/L  Hemoglobin A1c  Result Value Ref Range   Hgb A1c MFr Bld 6.1 (H) <5.7 % of total Hgb   Mean Plasma Glucose 128 (calc)   eAG (mmol/L) 7.1 (calc)  Lipid panel  Result Value Ref Range   Cholesterol 110 <200 mg/dL   HDL 38 (L) >40 mg/dL   Triglycerides 101 <150 mg/dL   LDL Cholesterol (Calc) 54 mg/dL (calc)   Total CHOL/HDL Ratio 2.9 <5.0 (calc)   Non-HDL Cholesterol (Calc) 72 <130 mg/dL (calc)  Microalbumin / creatinine urine ratio  Result Value Ref Range   Creatinine, Urine 124 20 - 320 mg/dL   Microalb, Ur 57.0 mg/dL   Microalb Creat Ratio 460 (H) <30 mcg/mg creat   Diabetic Labs (most recent): Lab Results  Component Value Date   HGBA1C 6.1 (H) 06/26/2017   HGBA1C 6.3 (H) 03/28/2017   HGBA1C 6.4 12/19/2016   Lipid Panel     Component Value Date/Time   CHOL 110 06/26/2017 1106   TRIG 101 06/26/2017 1106   HDL 38 (L) 06/26/2017 1106   CHOLHDL 2.9 06/26/2017 1106   VLDL 38 (H)  06/16/2016 0956   LDLCALC 72 06/16/2016 0956     Assessment & Plan:   1. Type 2 diabetes mellitus with stage 4 chronic kidney disease, with long-term current use of insulin (HCC)  -His  Diabetes is complicated by chronic kidney disease and patient remains at a high risk for more acute and chronic complications of diabetes which include CAD, CVA, CKD, retinopathy, and  neuropathy. These are all discussed in detail with the patient.  Patient came with controlled fasting glucose profile, and  recent A1c of 6.1%, generally improving from 7.4%.   Recent labs reviewed. - I have re-counseled the patient on diet management and weight loss  by adopting a carbohydrate restricted / protein rich  Diet.  -  Suggestion is made for him to avoid simple carbohydrates  from his diet including Cakes, Sweet Desserts / Pastries, Ice Cream, Soda (diet and regular), Sweet Tea, Candies, Chips, Cookies, Store Bought Juices, Alcohol in Excess of  1-2 drinks a day, Artificial Sweeteners, and "Sugar-free" Products. This will help patient to have stable blood glucose profile and potentially avoid unintended weight gain.   - Patient is advised to stick to a routine mealtimes to eat 3 meals  a day and avoid unnecessary snacks ( to snack only to correct hypoglycemia). I have also advised him to cut back on alcohol consumption.   - I have approached patient with the following individualized plan to manage diabetes and patient agrees.   -He is advised to stay away from ETOH and smoking. He will continue to need at least basal insulin therapy.  - I will lower Lantus to 30 units daily at bedtime associated with monitoring of blood glucose 2 times a day-daily before breakfast and at bedtime. - I advised him to stay off of Humalog for now . Continue Tradjenta 5mg  po qday.   He is following with Jearld Fenton, CDE for DM education.   -He will continue to f/u with Nephrology given stage 4-5 CKD, he has a dialysis fistula in place.  -Patient is not a candidate for metformin and SGLT2 inhibitors due to CKD.  - Patient specific target  for A1c; LDL, HDL, Triglycerides, and  Waist Circumference were discussed in detail.  2) BP/HTN: controlled. I advised him his current blood pressure medications including ACEI/ARB. 3) Lipids/HPL:  Continue statins. 4)  Weight/Diet: CDE consult in progress,  exercise, and carbohydrates information provided.  5) Vitamin D deficiency - He is status post therapy with vitamin D 50,000 weekly for 12 weeks, currently on vitamin D3 5,000 units daily. His insurance would not cover for repeat vitamin D test.   6) Chronic Care/Health Maintenance:  -Patient is on ACEI/ARB and Statin medications and encouraged to continue to follow up with Ophthalmology, nephrology (he is status post fistula placement in prep for hemodialysis) Podiatrist at least yearly or according to recommendations, and advised to quit smoking. I have recommended yearly flu vaccine and pneumonia vaccination at least every 5 years; moderate intensity exercise for up to 150 minutes weekly; and  sleep for at least 7 hours a day. - I advised patient to maintain close follow up with his PCP for primary care needs.  - Time spent with the patient: 25 min, of which >50% was spent in reviewing his sugar logs , discussing his hypo- and hyper-glycemic episodes, reviewing his current and  previous labs and insulin doses and developing a plan to avoid hypo- and hyper-glycemia.   Follow up plan: -Return in about 4 months (around  10/31/2017) for follow up with pre-visit labs, meter, and logs.  Glade Lloyd, MD Phone: [phone]  Fax: [phone]   This note was partially dictated with voice recognition software. Similar sounding words can be transcribed inadequately or may not  be corrected upon review.  07/02/2017, 9:19 AM

## 2017-07-03 DIAGNOSIS — B351 Tinea unguium: Secondary | ICD-10-CM | POA: Diagnosis not present

## 2017-07-03 DIAGNOSIS — E1142 Type 2 diabetes mellitus with diabetic polyneuropathy: Secondary | ICD-10-CM | POA: Diagnosis not present

## 2017-08-07 DIAGNOSIS — R809 Proteinuria, unspecified: Secondary | ICD-10-CM | POA: Diagnosis not present

## 2017-08-07 DIAGNOSIS — D509 Iron deficiency anemia, unspecified: Secondary | ICD-10-CM | POA: Diagnosis not present

## 2017-08-07 DIAGNOSIS — E1129 Type 2 diabetes mellitus with other diabetic kidney complication: Secondary | ICD-10-CM | POA: Diagnosis not present

## 2017-08-07 DIAGNOSIS — D519 Vitamin B12 deficiency anemia, unspecified: Secondary | ICD-10-CM | POA: Diagnosis not present

## 2017-08-07 DIAGNOSIS — I10 Essential (primary) hypertension: Secondary | ICD-10-CM | POA: Diagnosis not present

## 2017-08-07 DIAGNOSIS — N184 Chronic kidney disease, stage 4 (severe): Secondary | ICD-10-CM | POA: Diagnosis not present

## 2017-08-14 DIAGNOSIS — M9080 Osteopathy in diseases classified elsewhere, unspecified site: Secondary | ICD-10-CM | POA: Diagnosis not present

## 2017-08-14 DIAGNOSIS — R809 Proteinuria, unspecified: Secondary | ICD-10-CM | POA: Diagnosis not present

## 2017-08-14 DIAGNOSIS — N185 Chronic kidney disease, stage 5: Secondary | ICD-10-CM | POA: Diagnosis not present

## 2017-08-14 DIAGNOSIS — I10 Essential (primary) hypertension: Secondary | ICD-10-CM | POA: Diagnosis not present

## 2017-08-14 DIAGNOSIS — D631 Anemia in chronic kidney disease: Secondary | ICD-10-CM | POA: Diagnosis not present

## 2017-08-14 DIAGNOSIS — E889 Metabolic disorder, unspecified: Secondary | ICD-10-CM | POA: Diagnosis not present

## 2017-08-14 DIAGNOSIS — E1129 Type 2 diabetes mellitus with other diabetic kidney complication: Secondary | ICD-10-CM | POA: Diagnosis not present

## 2017-08-29 ENCOUNTER — Ambulatory Visit: Payer: Self-pay | Admitting: Orthopedic Surgery

## 2017-09-12 ENCOUNTER — Encounter: Payer: Self-pay | Admitting: Orthopedic Surgery

## 2017-09-12 ENCOUNTER — Ambulatory Visit (INDEPENDENT_AMBULATORY_CARE_PROVIDER_SITE_OTHER): Payer: Medicare Other | Admitting: Orthopedic Surgery

## 2017-09-12 VITALS — BP 122/75 | HR 76 | Ht 64.0 in | Wt 244.0 lb

## 2017-09-12 DIAGNOSIS — M25562 Pain in left knee: Secondary | ICD-10-CM

## 2017-09-12 DIAGNOSIS — G8929 Other chronic pain: Secondary | ICD-10-CM

## 2017-09-12 DIAGNOSIS — M1712 Unilateral primary osteoarthritis, left knee: Secondary | ICD-10-CM

## 2017-09-12 NOTE — Progress Notes (Signed)
Progress Note   Patient ID: [name] [name], male   DOB: 1950-10-29, 67 y.o.   MRN: [id number]  Chief Complaint  Patient presents with  . Knee Pain    left    67 year old male presents for evaluation of left knee pain  [name] [name] has multiple medical problems including chronic kidney failure as well as heart disease he got an injection about a year and a half ago he did well comes in complaining of new onset increasing dull aching pain lateral side left knee no history of trauma no swelling feels an occasional catch in the knee.  No prior treatment.     Review of Systems  Constitutional: Negative for fever.  Respiratory: Negative for shortness of breath.   Cardiovascular: Negative for chest pain.   Current Meds  Medication Sig  . amLODipine (NORVASC) 10 MG tablet Take 10 mg by mouth daily.   Marland Kitchen aspirin EC 81 MG tablet Take 1 tablet (81 mg total) by mouth daily.  . budesonide-formoterol (SYMBICORT) 160-4.5 MCG/ACT inhaler Inhale 2 puffs into the lungs 2 (two) times daily as needed (Shortness of Breath).   . cetirizine (ZYRTEC) 10 MG tablet Take 10 mg by mouth at bedtime as needed for allergies.   . furosemide (LASIX) 40 MG tablet Take 1 tablet by mouth daily.  . hydrALAZINE (APRESOLINE) 25 MG tablet Take 25 mg by mouth 3 (three) times daily.  . Insulin Pen Needle (PEN NEEDLES 3/16") 31G X 5 MM MISC Use as directed qhs  . LANTUS SOLOSTAR 100 UNIT/ML Solostar Pen INJECT 40 UNITS SUBCUTANEOUSLY AT BEDTIME.  . mometasone (NASONEX) 50 MCG/ACT nasal spray Place 2 sprays into the nose 2 (two) times daily as needed (allergies).   . pantoprazole (PROTONIX) 40 MG tablet TAKE 1 TABLET TWICE DAILY 30 MINUTES PRIOR TO MEALS  . simvastatin (ZOCOR) 20 MG tablet Take 20 mg by mouth every morning.   . TRADJENTA 5 MG TABS tablet TAKE 1 TABLET EVERY DAY    Past Medical History:  Diagnosis Date  . Anemia   . Arthritis   . Bell palsy   . CAD (coronary artery disease)    a. 2014 MV: abnl w/ infap  ischemia; b. 03/2013 Cath: aneurysmal bleb in the LAD w/ otw nonobs dzs-->Med Rx.  . Chronic back pain   . Chronic knee pain    a. 09/2015 s/p R TKA.  Marland Kitchen Chronic pain   . Chronic shoulder pain   . Chronic sinusitis   . CKD (chronic kidney disease), stage IV ([place])    stage 4 per office visit note of Dr Lowanda Foster on 05/2015   . COPD (chronic obstructive pulmonary disease) ([place])   . Diabetes mellitus without complication ([place])   . Dyspnea   . Essential hypertension   . GERD (gastroesophageal reflux disease)   . Hepatomegaly    noted on noncontrast CT 2015  . History of hiatal hernia   . Hyperlipidemia   . Lateral meniscus tear   . Obesity    Truncal  . Obstructive sleep apnea    cpap - settings at 4   . On home oxygen therapy    uses 2l when is going somewhere per patient   . Pneumonia   . Post traumatic stress disorder   . PUD (peptic ulcer disease)    remote, reports f/u EGD about 8 years ago unremarkable   . Reactive airway disease    related to exposure to chemical during 9/11  . Sinusitis   .  Vitamin D deficiency      Allergies  Allergen Reactions  . Opana [Oxymorphone Hcl] Itching  . Tramadol Itching    BP 122/75   Pulse 76   Ht 5\' 4"  (1.626 m)   Wt 244 lb (110.7 kg)   BMI 41.88 kg/m    Physical Exam  Constitutional: He is oriented to person, place, and time. He appears well-developed and well-nourished.  Vital signs have been reviewed and are stable. Gen. appearance the patient is well-developed and well-nourished with normal grooming and hygiene.   Musculoskeletal:       Right knee: He exhibits no effusion.       Left knee: He exhibits no effusion.  GAIT IS normal   Neurological: He is alert and oriented to person, place, and time.  Skin: Skin is warm and dry. No erythema.  Psychiatric: He has a normal mood and affect.  Vitals reviewed.   Right Knee Exam   Muscle Strength  The patient has normal right knee strength.  Tests  Drawer:  Anterior -  negative    Posterior - negative  Other  Erythema: absent Sensation: normal Pulse: present Swelling: none Effusion: no effusion present   Left Knee Exam   Muscle Strength  The patient has normal left knee strength.  Tenderness  The patient is experiencing tenderness in the lateral joint line and patellar tendon.  Range of Motion  Extension: normal  Left knee flexion: 125.   Tests  Drawer:  Anterior - negative     Posterior - negative  Other  Erythema: absent Scars: absent Sensation: normal Pulse: present Swelling: none Effusion: no effusion present      X-ray from July 2018 shows severe narrowing of the medial compartment mild varus deformity Encounter Diagnoses  Name Primary?  . Primary osteoarthritis of left knee Yes  . Chronic pain of left knee     His medical condition is stable but he is not a good surgical candidate so we opted to inject his knee again  Procedure note left knee injection verbal consent was obtained to inject left knee joint  Timeout was completed to confirm the site of injection  The medications used were 40 mg of Depo-Medrol and 1% lidocaine 3 cc  Anesthesia was provided by ethyl chloride and the skin was prepped with alcohol.  After cleaning the skin with alcohol a 20-gauge needle was used to inject the left knee joint. There were no complications. A sterile bandage was applied.       [name] Abbott, MD 09/12/2017 9:05 AM

## 2017-09-12 NOTE — Patient Instructions (Addendum)
.  sehin

## 2017-09-18 DIAGNOSIS — B351 Tinea unguium: Secondary | ICD-10-CM | POA: Diagnosis not present

## 2017-09-18 DIAGNOSIS — E1142 Type 2 diabetes mellitus with diabetic polyneuropathy: Secondary | ICD-10-CM | POA: Diagnosis not present

## 2017-09-18 DIAGNOSIS — L851 Acquired keratosis [keratoderma] palmaris et plantaris: Secondary | ICD-10-CM | POA: Diagnosis not present

## 2017-10-01 DIAGNOSIS — N5201 Erectile dysfunction due to arterial insufficiency: Secondary | ICD-10-CM | POA: Diagnosis not present

## 2017-10-01 DIAGNOSIS — T83490A Other mechanical complication of penile (implanted) prosthesis, initial encounter: Secondary | ICD-10-CM | POA: Diagnosis not present

## 2017-10-01 DIAGNOSIS — N390 Urinary tract infection, site not specified: Secondary | ICD-10-CM | POA: Diagnosis not present

## 2017-10-02 DIAGNOSIS — E1165 Type 2 diabetes mellitus with hyperglycemia: Secondary | ICD-10-CM | POA: Diagnosis not present

## 2017-10-02 DIAGNOSIS — I10 Essential (primary) hypertension: Secondary | ICD-10-CM | POA: Diagnosis not present

## 2017-10-02 DIAGNOSIS — N184 Chronic kidney disease, stage 4 (severe): Secondary | ICD-10-CM | POA: Diagnosis not present

## 2017-10-02 DIAGNOSIS — M542 Cervicalgia: Secondary | ICD-10-CM | POA: Diagnosis not present

## 2017-10-09 ENCOUNTER — Emergency Department (HOSPITAL_COMMUNITY)
Admission: EM | Admit: 2017-10-09 | Discharge: 2017-10-09 | Disposition: A | Discharge status: Home / Self Care | Payer: Medicare Other | Attending: Emergency Medicine | Admitting: Emergency Medicine

## 2017-10-09 ENCOUNTER — Other Ambulatory Visit: Payer: Self-pay

## 2017-10-09 ENCOUNTER — Encounter (HOSPITAL_COMMUNITY): Payer: Self-pay | Admitting: Emergency Medicine

## 2017-10-09 ENCOUNTER — Emergency Department (HOSPITAL_COMMUNITY): Payer: Medicare Other

## 2017-10-09 DIAGNOSIS — I2510 Atherosclerotic heart disease of native coronary artery without angina pectoris: Secondary | ICD-10-CM | POA: Insufficient documentation

## 2017-10-09 DIAGNOSIS — Z794 Long term (current) use of insulin: Secondary | ICD-10-CM | POA: Insufficient documentation

## 2017-10-09 DIAGNOSIS — M10372 Gout due to renal impairment, left ankle and foot: Secondary | ICD-10-CM | POA: Diagnosis not present

## 2017-10-09 DIAGNOSIS — Z87891 Personal history of nicotine dependence: Secondary | ICD-10-CM | POA: Diagnosis not present

## 2017-10-09 DIAGNOSIS — I1310 Hypertensive heart and chronic kidney disease without heart failure, with stage 1 through stage 4 chronic kidney disease, or unspecified chronic kidney disease: Secondary | ICD-10-CM | POA: Insufficient documentation

## 2017-10-09 DIAGNOSIS — M25472 Effusion, left ankle: Secondary | ICD-10-CM | POA: Diagnosis not present

## 2017-10-09 DIAGNOSIS — J449 Chronic obstructive pulmonary disease, unspecified: Secondary | ICD-10-CM | POA: Insufficient documentation

## 2017-10-09 DIAGNOSIS — N184 Chronic kidney disease, stage 4 (severe): Secondary | ICD-10-CM | POA: Diagnosis not present

## 2017-10-09 DIAGNOSIS — E119 Type 2 diabetes mellitus without complications: Secondary | ICD-10-CM | POA: Diagnosis not present

## 2017-10-09 DIAGNOSIS — M25572 Pain in left ankle and joints of left foot: Secondary | ICD-10-CM | POA: Diagnosis present

## 2017-10-09 DIAGNOSIS — Z79899 Other long term (current) drug therapy: Secondary | ICD-10-CM | POA: Insufficient documentation

## 2017-10-09 LAB — CBG MONITORING, ED: GLUCOSE-CAPILLARY: 90 mg/dL (ref 65–99)

## 2017-10-09 LAB — URIC ACID: Uric Acid, Serum: 8.8 mg/dL — ABNORMAL HIGH (ref 4.4–7.6)

## 2017-10-09 MED ORDER — COLCHICINE 0.6 MG PO TABS
0.6000 mg | ORAL_TABLET | Freq: Once | ORAL | Status: AC
  Administered 2017-10-09: 0.6 mg via ORAL
  Filled 2017-10-09: qty 1

## 2017-10-09 MED ORDER — DEXAMETHASONE SODIUM PHOSPHATE 10 MG/ML IJ SOLN
10.0000 mg | Freq: Once | INTRAMUSCULAR | Status: AC
  Administered 2017-10-09: 10 mg via INTRAMUSCULAR
  Filled 2017-10-09: qty 1

## 2017-10-09 MED ORDER — COLCHICINE 0.6 MG PO TABS: 0.3000 mg | ORAL_TABLET | Freq: Every day | ORAL | 0 refills | Status: DC

## 2017-10-09 MED ORDER — PREDNISONE 20 MG PO TABS: ORAL_TABLET | ORAL | 0 refills | Status: DC

## 2017-10-09 NOTE — Discharge Instructions (Addendum)
Use heat over your ankle for comfort. Take the medications as prescribed. Watch your diabetic diet closely while on the prednisone. Please let your primary care doctor or your kidney doctor know you are having gout attacks and your uric acid level is high. They may want to put you on other medications to get that level down. Look at the low purine diet instructions, it will tell you foods to avoid that can make your gout flare-up.

## 2017-10-09 NOTE — ED Provider Notes (Signed)
[facility] EMERGENCY DEPARTMENT Provider Note   CSN: [id number] Arrival date & time: 10/09/17  0118  Time seen 02:30 AM   History   Chief Complaint Chief Complaint  Patient presents with  . Foot Pain    HPI [name] [name] is a 67 y.o. male.  HPI patient states he has been having pain in his left ankle off and on for years.  He is seeing a podiatrist for it.  He states the podiatrist states that something is wrong with his blood vessels.  Patient states "I need a MRI of my foot".  He states Sunday, March 24 he started having swelling on the lateral aspect of his ankle with some pain that he describes as aching and constant.  It hurts with weightbearing.  He states he took some over-the-counter arthritis pill that he does not know what it contains for the pain without relief.  He denies any fever.  He denies any injury.  He denies being told he has gout in the past.  He denies family history of gout.  PCP [name], MD   Past Medical History:  Diagnosis Date  . Anemia   . Arthritis   . Bell palsy   . CAD (coronary artery disease)    a. 2014 MV: abnl w/ infap ischemia; b. 03/2013 Cath: aneurysmal bleb in the LAD w/ otw nonobs dzs-->Med Rx.  . Chronic back pain   . Chronic knee pain    a. 09/2015 s/p R TKA.  Marland Kitchen Chronic pain   . Chronic shoulder pain   . Chronic sinusitis   . CKD (chronic kidney disease), stage IV ([place])    stage 4 per office visit note of Dr Lowanda Foster on 05/2015   . COPD (chronic obstructive pulmonary disease) ([place])   . Diabetes mellitus without complication ([place])   . Dyspnea   . Essential hypertension   . GERD (gastroesophageal reflux disease)   . Hepatomegaly    noted on noncontrast CT 2015  . History of hiatal hernia   . Hyperlipidemia   . Lateral meniscus tear   . Obesity    Truncal  . Obstructive sleep apnea    cpap - settings at 4   . On home oxygen therapy    uses 2l when is going somewhere per patient   . Pneumonia   . Post traumatic  stress disorder   . PUD (peptic ulcer disease)    remote, reports f/u EGD about 8 years ago unremarkable   . Reactive airway disease    related to exposure to chemical during 9/11  . Sinusitis   . Vitamin D deficiency     Patient Active Problem List   Diagnosis Date Noted  . Foul smelling urine 12/14/2016  . Coronary artery disease involving native coronary artery of native heart without angina pectoris 10/27/2016  . Chronic kidney disease, stage IV (severe) ([place]) 10/27/2016  . Morbid obesity due to excess calories ([place]) 03/22/2016  . CAD (coronary artery disease)   . Erectile dysfunction 08/16/2015  . Hemorrhoids 07/05/2015  . Vitamin D deficiency 06/04/2015  . OSA on CPAP 05/14/2015  . Normocytic anemia 03/24/2015  . Fatty liver 12/01/2014  . Back pain 06/05/2013  . OA (osteoarthritis) of knee 03/15/2011  . CTS (carpal tunnel syndrome) 03/15/2011  . Type 2 diabetes mellitus with stage 4 chronic kidney disease, with long-term current use of insulin ([place]) 09/29/2008  . Mixed hyperlipidemia 09/29/2008  . Essential hypertension 09/29/2008  . GERD 09/29/2008  Past Surgical History:  Procedure Laterality Date  . ASAD LT SHOULDER  12/2008   left shoulder  . AV FISTULA PLACEMENT Left 08/09/2016   Procedure: BRACHIOCEPHALIC ARTERIOVENOUS (AV) FISTULA CREATION LEFT ARM;  Surgeon: [name], MD;  Location: [facility];  Service: Vascular;  Laterality: Left;  . CATARACT EXTRACTION W/PHACO Left 03/28/2016   Procedure: CATARACT EXTRACTION PHACO AND INTRAOCULAR LENS PLACEMENT LEFT EYE;  Surgeon: [name], MD;  Location: [facility];  Service: Ophthalmology;  Laterality: Left;  CDE: 4.77  . CATARACT EXTRACTION W/PHACO Right 04/11/2016   Procedure: CATARACT EXTRACTION PHACO AND INTRAOCULAR LENS PLACEMENT RIGHT EYE; CDE:  4.74;  Surgeon: [name], MD;  Location: [facility];  Service: Ophthalmology;  Laterality: Right;  . COLONOSCOPY  10/2008   Fields: Rectal polyp obliterated, not retrieved,  hemorrhoids, single ascending colon diverticulum near the CV. Next colonoscopy April 2020  . COLONOSCOPY N/A 12/25/2014   SLF: 1. Colorectal polyps (2) removed 2. Small internal hemorrhoids 3. the left colon is severely redundant  . DOPPLER ECHOCARDIOGRAPHY    . ESOPHAGOGASTRODUODENOSCOPY N/A 12/25/2014   SLF: 1. Anemia most likely due to CRI, gastritis, gastric polyps 2. Moderate non-erosive gastriits and mild duodenitis.  3.TWo large gstric polyps removed.   Marland Kitchen EYE SURGERY  12/22/2010   tear duct probing-[place]  . FOREIGN BODY REMOVAL  03/29/2011   Procedure: REMOVAL FOREIGN BODY EXTREMITY;  Surgeon: Arther Abbott, MD;  Location: [facility];  Service: Orthopedics;  Laterality: Right;  Removal Foreign Body Right Thumb  . KNEE ARTHROSCOPY  10/2007   left  . KNEE ARTHROSCOPY WITH LATERAL MENISECTOMY Right 10/14/2015   Procedure: LEFT KNEE ARTHROSCOPY WITH PARTIAL LATERAL MENISECTOMY;  Surgeon: Carole Civil, MD;  Location: [facility];  Service: Orthopedics;  Laterality: Right;  . LEFT HEART CATHETERIZATION WITH CORONARY ANGIOGRAM N/A 03/28/2013   Procedure: LEFT HEART CATHETERIZATION WITH CORONARY ANGIOGRAM;  Surgeon: Leonie Man, MD;  Location: [facility] CATH LAB;  Service: Cardiovascular;  Laterality: N/A;  . NM MYOVIEW LTD    . PENILE PROSTHESIS IMPLANT N/A 08/16/2015   Procedure: PENILE PROTHESIS INFLATABLE, three piece, Excisional biopsy of Penile ulcer, Penile molding;  Surgeon: Carolan Clines, MD;  Location: [facility];  Service: Urology;  Laterality: N/A;  . QUADRICEPS TENDON REPAIR  07/21/2011   Procedure: REPAIR QUADRICEP TENDON;  Surgeon: Arther Abbott, MD;  Location: [facility];  Service: Orthopedics;  Laterality: Right;  . TOENAIL EXCISION     removed [id number]  . UMBILICAL HERNIA REPAIR  2007   [place]        Home Medications    Prior to Admission medications   Medication Sig Start Date End Date Taking? Authorizing Provider  amLODipine (NORVASC) 10 MG tablet Take 10 mg by mouth  daily.     [provider]  aspirin EC 81 MG tablet Take 1 tablet (81 mg total) by mouth daily. 05/01/16   Hilty, Nadean Corwin, MD  budesonide-formoterol (SYMBICORT) 160-4.5 MCG/ACT inhaler Inhale 2 puffs into the lungs 2 (two) times daily as needed (Shortness of Breath).     [provider]  cetirizine (ZYRTEC) 10 MG tablet Take 10 mg by mouth at bedtime as needed for allergies.     [provider]  Cholecalciferol (VITAMIN D) 2000 units tablet Take 2,000 Units by mouth daily.    [provider]  colchicine 0.6 MG tablet Take 0.5 tablets (0.3 mg total) by mouth daily. 10/09/17   Rolland Porter, MD  diclofenac sodium (VOLTAREN) 1 % GEL Apply 4  g topically 4 (four) times daily. To left knee Patient not taking: Reported on 09/12/2017 02/27/17   Carole Civil, MD  furosemide (LASIX) 40 MG tablet Take 1 tablet by mouth daily. 10/18/16   [provider]  hydrALAZINE (APRESOLINE) 25 MG tablet Take 25 mg by mouth 3 (three) times daily. 06/29/16   [provider]  Insulin Pen Needle (PEN NEEDLES 3/16") 31G X 5 MM MISC Use as directed qhs 02/05/17   Cassandria Anger, MD  iron polysaccharides (NIFEREX) 150 MG capsule Take 150 mg by mouth 2 (two) times daily.     [provider]  LANTUS SOLOSTAR 100 UNIT/ML Solostar Pen INJECT 40 UNITS SUBCUTANEOUSLY AT BEDTIME. 06/09/17   Angelia Mould, MD  mometasone (NASONEX) 50 MCG/ACT nasal spray Place 2 sprays into the nose 2 (two) times daily as needed (allergies).     [provider]  pantoprazole (PROTONIX) 40 MG tablet TAKE 1 TABLET TWICE DAILY 30 MINUTES PRIOR TO MEALS 02/26/17   Annitta Needs, NP  predniSONE (DELTASONE) 20 MG tablet Take 2 po QD x 3d then 1 po QD x 4d 10/09/17   Rolland Porter, MD  simvastatin (ZOCOR) 20 MG tablet Take 20 mg by mouth every morning.     [provider]  TRADJENTA 5 MG TABS tablet TAKE 1 TABLET EVERY DAY 06/27/17   Cassandria Anger, MD    Family  History Family History  Problem Relation Age of Onset  . Hypertension Mother        MI  . Cancer Mother        breast   . Diabetes Mother   . Diabetes Father   . Hypertension Father   . Hypertension Sister   . Diabetes Sister   . Arthritis Unknown   . Asthma Unknown   . Lung disease Unknown   . Anesthesia problems Neg Hx   . Hypotension Neg Hx   . Malignant hyperthermia Neg Hx   . Pseudochol deficiency Neg Hx   . Colon cancer Neg Hx     Social History Social History   Tobacco Use  . Smoking status: Former Smoker    Packs/day: 1.00    Years: 25.00    Pack years: 25.00    Types: Cigarettes    Last attempt to quit: 03/27/2010    Years since quitting: 7.5  . Smokeless tobacco: Never Used  . Tobacco comment: Quit x 7 years  Substance Use Topics  . Alcohol use: Yes    Alcohol/week: 0.0 oz    Comment: occasionally  . Drug use: Yes    Types: Marijuana    Comment: no cocaine for several years. Marijuana occ 06/21/17  on disability for COPD Uses a CPAP prn Uses home oxygen 2 lpm [place] prn   Allergies   Opana [oxymorphone hcl] and Tramadol   Review of Systems Review of Systems  All other systems reviewed and are negative.    Physical Exam Updated Vital Signs BP 133/82   Pulse 70   Temp 97.9 F (36.6 C) (Oral)   Resp 17   Ht 5\' 4"  (1.626 m)   Wt 107.5 kg (237 lb)   SpO2 99%   BMI 40.68 kg/m   Vital signs normal    Physical Exam  Constitutional: He is oriented to person, place, and time. He appears well-developed and well-nourished. No distress.  HENT:  Head: Normocephalic and atraumatic.  Right Ear: External ear normal.  Left Ear: External ear normal.  Nose: Nose normal.  Eyes: Conjunctivae and EOM are normal.  Neck: Normal range of motion.  Cardiovascular: Normal rate.  Pulmonary/Chest: Effort normal. No respiratory distress.  Musculoskeletal: He exhibits edema and tenderness. He exhibits no deformity.       Feet:  Patient is nontender to  palpation in his left knee, there is no joint effusion noted.  He complains of pain to palpation in his left lower extremity about halfway down the leg and he is noted to have some swelling of the lateral malleolus but especially below it with some redness and warmth.  He has some diffuse tenderness of his foot but very tender over the swollen area.  Area of swelling noted  Neurological: He is alert and oriented to person, place, and time. No cranial nerve deficit.  Skin: Skin is warm and dry. No rash noted. There is erythema.  Psychiatric: He has a normal mood and affect. His behavior is normal. Thought content normal.  Nursing note and vitals reviewed.    ED Treatments / Results  Labs (all labs ordered are listed, but only abnormal results are displayed)  Results for orders placed or performed during the hospital encounter of 10/09/17  Uric acid  Result Value Ref Range   Uric Acid, Serum 8.8 (H) 4.4 - 7.6 mg/dL  CBG monitoring, ED  Result Value Ref Range   Glucose-Capillary 90 65 - 99 mg/dL    Laboratory interpretation all normal except elevated uric acid level  EKG None  Radiology Dg Ankle Complete Left  Result Date: 10/09/2017 CLINICAL DATA:  67 year old male with progressed chronic lateral ankle pain radiating proximally. No new injury. EXAM: LEFT ANKLE COMPLETE - 3+ VIEW COMPARISON:  Left foot series 12/20/2015. FINDINGS: Evidence of a left ankle joint effusion on the lateral view. [place]ise joint alignment preserved. Talar dome intact. Posttraumatic and/or degenerative spurring and cortical irregularity about the medial malleolus and medial talus. The lateral malleolus has a more normal appearance. Degenerative *SCRATCH* SPECT chronic degenerative spurring at the calcaneus. No acute osseous abnormality identified. IMPRESSION: 1. Left ankle joint effusion suspected. 2. No acute osseous abnormality identified. Chronic degenerative and/or posttraumatic changes primarily about the  medial ankle. Electronically Signed   By: Genevie Ann M.D.   On: 10/09/2017 02:24    Procedures Procedures (including critical care time)  Medications Ordered in ED Medications  dexamethasone (DECADRON) injection 10 mg (10 mg Intramuscular Given 10/09/17 0254)  colchicine tablet 0.6 mg (0.6 mg Oral Given 10/09/17 0255)     Initial Impression / Assessment and Plan / ED Course  I have reviewed the triage vital signs and the nursing notes.  Pertinent labs & imaging results that were available during my care of the patient were reviewed by me and considered in my medical decision making (see chart for details).     Patient's ankle looks suspiciously like gout.  Patient does have chronic renal disease and he is at risk for having gout.  Uric acid level was done after reviewing his prior testing, I do not find where he has been tested for it before.  Patient uric acid level is elevated.  I reviewed the [email or web address] site and they recommend with creatinine clearance less than 30 cc/min to put on colchicine 0.3 mg a day.  Final Clinical Impressions(s) / ED Diagnoses   Final diagnoses:  Acute gout due to renal impairment involving left ankle    ED Discharge Orders        Ordered    colchicine 0.6  MG tablet  Daily     10/09/17 0343    predniSONE (DELTASONE) 20 MG tablet     10/09/17 0343      Plan discharge  Rolland Porter, MD, Barbette Or, MD 10/09/17 [phone]

## 2017-10-09 NOTE — ED Triage Notes (Signed)
Pt c/o left ankle pain since Sunday and denies any injury.

## 2017-10-10 DIAGNOSIS — E1129 Type 2 diabetes mellitus with other diabetic kidney complication: Secondary | ICD-10-CM | POA: Diagnosis not present

## 2017-10-10 DIAGNOSIS — N184 Chronic kidney disease, stage 4 (severe): Secondary | ICD-10-CM | POA: Diagnosis not present

## 2017-10-10 DIAGNOSIS — D509 Iron deficiency anemia, unspecified: Secondary | ICD-10-CM | POA: Diagnosis not present

## 2017-10-10 DIAGNOSIS — R809 Proteinuria, unspecified: Secondary | ICD-10-CM | POA: Diagnosis not present

## 2017-10-10 DIAGNOSIS — D519 Vitamin B12 deficiency anemia, unspecified: Secondary | ICD-10-CM | POA: Diagnosis not present

## 2017-10-10 DIAGNOSIS — I10 Essential (primary) hypertension: Secondary | ICD-10-CM | POA: Diagnosis not present

## 2017-10-17 ENCOUNTER — Other Ambulatory Visit: Payer: Self-pay | Admitting: "Endocrinology

## 2017-10-17 DIAGNOSIS — M109 Gout, unspecified: Secondary | ICD-10-CM | POA: Diagnosis not present

## 2017-10-17 DIAGNOSIS — D638 Anemia in other chronic diseases classified elsewhere: Secondary | ICD-10-CM | POA: Diagnosis not present

## 2017-10-17 DIAGNOSIS — N2581 Secondary hyperparathyroidism of renal origin: Secondary | ICD-10-CM | POA: Diagnosis not present

## 2017-10-17 DIAGNOSIS — N185 Chronic kidney disease, stage 5: Secondary | ICD-10-CM | POA: Diagnosis not present

## 2017-10-23 DIAGNOSIS — E1122 Type 2 diabetes mellitus with diabetic chronic kidney disease: Secondary | ICD-10-CM | POA: Diagnosis not present

## 2017-10-23 DIAGNOSIS — N184 Chronic kidney disease, stage 4 (severe): Secondary | ICD-10-CM | POA: Diagnosis not present

## 2017-10-23 DIAGNOSIS — Z794 Long term (current) use of insulin: Secondary | ICD-10-CM | POA: Diagnosis not present

## 2017-10-24 LAB — COMPLETE METABOLIC PANEL WITH GFR
AG Ratio: 1.7 (calc) (ref 1.0–2.5)
ALBUMIN MSPROF: 4.1 g/dL (ref 3.6–5.1)
ALT: 12 U/L (ref 9–46)
AST: 18 U/L (ref 10–35)
Alkaline phosphatase (APISO): 40 U/L (ref 40–115)
BILIRUBIN TOTAL: 0.4 mg/dL (ref 0.2–1.2)
BUN / CREAT RATIO: 9 (calc) (ref 6–22)
BUN: 54 mg/dL — AB (ref 7–25)
CO2: 27 mmol/L (ref 20–32)
Calcium: 8.8 mg/dL (ref 8.6–10.3)
Chloride: 106 mmol/L (ref 98–110)
Creat: 5.77 mg/dL — ABNORMAL HIGH (ref 0.70–1.25)
GFR, Est African American: 11 mL/min/{1.73_m2} — ABNORMAL LOW (ref >=60)
GFR, Est Non African American: 9 mL/min/{1.73_m2} — ABNORMAL LOW (ref >=60)
GLUCOSE: 100 mg/dL — AB (ref 65–99)
Globulin: 2.4 g/dL (calc) (ref 1.9–3.7)
POTASSIUM: 4.1 mmol/L (ref 3.5–5.3)
SODIUM: 142 mmol/L (ref 135–146)
Total Protein: 6.5 g/dL (ref 6.1–8.1)

## 2017-10-24 LAB — HEMOGLOBIN A1C
HEMOGLOBIN A1C: 6.2 %{Hb} — AB (ref <5.7)
MEAN PLASMA GLUCOSE: 131 (calc)
eAG (mmol/L): 7.3 (calc)

## 2017-11-05 ENCOUNTER — Ambulatory Visit (INDEPENDENT_AMBULATORY_CARE_PROVIDER_SITE_OTHER): Payer: Medicare Other | Admitting: "Endocrinology

## 2017-11-05 ENCOUNTER — Encounter: Payer: Self-pay | Admitting: "Endocrinology

## 2017-11-05 VITALS — BP 138/77 | HR 67 | Ht 64.0 in | Wt 235.0 lb

## 2017-11-05 DIAGNOSIS — I10 Essential (primary) hypertension: Secondary | ICD-10-CM

## 2017-11-05 DIAGNOSIS — N185 Chronic kidney disease, stage 5: Secondary | ICD-10-CM | POA: Diagnosis not present

## 2017-11-05 DIAGNOSIS — E1122 Type 2 diabetes mellitus with diabetic chronic kidney disease: Secondary | ICD-10-CM

## 2017-11-05 DIAGNOSIS — E782 Mixed hyperlipidemia: Secondary | ICD-10-CM | POA: Diagnosis not present

## 2017-11-05 NOTE — Progress Notes (Signed)
Subjective:    Patient ID: [name] [name], male    DOB: [birth year], PCP [name], MD   Past Medical History:  Diagnosis Date  . Anemia   . Arthritis   . Bell palsy   . CAD (coronary artery disease)    a. 2014 MV: abnl w/ infap ischemia; b. 03/2013 Cath: aneurysmal bleb in the LAD w/ otw nonobs dzs-->Med Rx.  . Chronic back pain   . Chronic knee pain    a. 09/2015 s/p R TKA.  Marland Kitchen Chronic pain   . Chronic shoulder pain   . Chronic sinusitis   . CKD (chronic kidney disease), stage IV ([place])    stage 4 per office visit note of Dr Lowanda Foster on 05/2015   . COPD (chronic obstructive pulmonary disease) ([place])   . Diabetes mellitus without complication ([place])   . Dyspnea   . Essential hypertension   . GERD (gastroesophageal reflux disease)   . Hepatomegaly    noted on noncontrast CT 2015  . History of hiatal hernia   . Hyperlipidemia   . Lateral meniscus tear   . Obesity    [place]  . Obstructive sleep apnea    cpap - settings at 4   . On home oxygen therapy    uses 2l when is going somewhere per patient   . Pneumonia   . Post traumatic stress disorder   . PUD (peptic ulcer disease)    remote, reports f/u EGD about 8 years ago unremarkable   . Reactive airway disease    related to exposure to chemical during 9/11  . Sinusitis   . Vitamin D deficiency    Past Surgical History:  Procedure Laterality Date  . ASAD LT SHOULDER  12/2008   left shoulder  . AV FISTULA PLACEMENT Left 08/09/2016   Procedure: BRACHIOCEPHALIC ARTERIOVENOUS (AV) FISTULA CREATION LEFT ARM;  Surgeon: [name], MD;  Location: [place];  Service: Vascular;  Laterality: Left;  . CATARACT EXTRACTION W/PHACO Left 03/28/2016   Procedure: CATARACT EXTRACTION PHACO AND INTRAOCULAR LENS PLACEMENT LEFT EYE;  Surgeon: [name], MD;  Location: [facility];  Service: Ophthalmology;  Laterality: Left;  CDE: 4.77  . CATARACT EXTRACTION W/PHACO Right 04/11/2016   Procedure: CATARACT EXTRACTION PHACO AND INTRAOCULAR  LENS PLACEMENT RIGHT EYE; CDE:  4.74;  Surgeon: [name], MD;  Location: [facility];  Service: Ophthalmology;  Laterality: Right;  . COLONOSCOPY  10/2008   Fields: Rectal polyp obliterated, not retrieved, hemorrhoids, single ascending colon diverticulum near the CV. Next colonoscopy April 2020  . COLONOSCOPY N/A 12/25/2014   SLF: 1. Colorectal polyps (2) removed 2. Small internal hemorrhoids 3. the left colon is severely redundant  . DOPPLER ECHOCARDIOGRAPHY    . ESOPHAGOGASTRODUODENOSCOPY N/A 12/25/2014   SLF: 1. Anemia most likely due to CRI, gastritis, gastric polyps 2. Moderate non-erosive gastriits and mild duodenitis.  3.TWo large gstric polyps removed.   Marland Kitchen EYE SURGERY  12/22/2010   tear duct probing-[place]  . FOREIGN BODY REMOVAL  03/29/2011   Procedure: REMOVAL FOREIGN BODY EXTREMITY;  Surgeon: Arther Abbott, MD;  Location: [facility];  Service: Orthopedics;  Laterality: Right;  Removal Foreign Body Right Thumb  . KNEE ARTHROSCOPY  10/2007   left  . KNEE ARTHROSCOPY WITH LATERAL MENISECTOMY Right 10/14/2015   Procedure: LEFT KNEE ARTHROSCOPY WITH PARTIAL LATERAL MENISECTOMY;  Surgeon: Carole Civil, MD;  Location: [facility];  Service: Orthopedics;  Laterality: Right;  . LEFT HEART CATHETERIZATION WITH CORONARY ANGIOGRAM N/A 03/28/2013  Procedure: LEFT HEART CATHETERIZATION WITH CORONARY ANGIOGRAM;  Surgeon: Leonie Man, MD;  Location: [facility] C[facility];  Service: Cardiovascular;  Laterality: N/A;  . NM MYOVIEW LTD    . PENILE PROSTHESIS IMPLANT N/A 08/16/2015   Procedure: PENILE PROTHESIS INFLATABLE, three piece, Excisional biopsy of Penile ulcer, Penile molding;  Surgeon: Carolan Clines, MD;  Location: [facility];  Service: Urology;  Laterality: N/A;  . QUADRICEPS TENDON REPAIR  07/21/2011   Procedure: REPAIR QUADRICEP TENDON;  Surgeon: Arther Abbott, MD;  Location: [facility];  Service: Orthopedics;  Laterality: Right;  . TOENAIL EXCISION     removed [id number]  . UMBILICAL HERNIA REPAIR   2007   [place]   Social History   Socioeconomic History  . Marital status: Married    Spouse name: Not on file  . Number of children: 2  . Years of education: 12th grade  . Highest education level: Not on file  Occupational History  . Occupation: disabled  . Occupation:      Employer: UNEMPLOYED  Social Needs  . Financial resource strain: Not on file  . Food insecurity:    Worry: Not on file    Inability: Not on file  . Transportation needs:    Medical: Not on file    Non-medical: Not on file  Tobacco Use  . Smoking status: Former Smoker    Packs/day: 1.00    Years: 25.00    Pack years: 25.00    Types: Cigarettes    Last attempt to quit: 03/27/2010    Years since quitting: 7.6  . Smokeless tobacco: Never Used  . Tobacco comment: Quit x 7 years  Substance and Sexual Activity  . Alcohol use: Yes    Alcohol/week: 0.0 oz    Comment: occasionally  . Drug use: Yes    Types: Marijuana    Comment: no cocaine for several years. Marijuana occ 06/21/17  . Sexual activity: Not on file  Lifestyle  . Physical activity:    Days per week: Not on file    Minutes per session: Not on file  . Stress: Not on file  Relationships  . Social connections:    Talks on phone: Not on file    Gets together: Not on file    Attends religious service: Not on file    Active member of club or organization: Not on file    Attends meetings of clubs or organizations: Not on file    Relationship status: Not on file  Other Topics Concern  . Not on file  Social History Narrative   He quit smoking in 2010. 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 He developed pulmonary problems, became disabled because of lower airway disease in 2009.       WATCHES BASKETBALL. HIS TEAM IS [employer].   Outpatient Encounter Medications as of 11/05/2017  Medication Sig  . amLODipine (NORVASC) 10 MG tablet Take 10 mg by mouth daily.   Marland Kitchen aspirin EC 81 MG tablet Take 1 tablet (81 mg  total) by mouth daily.  . budesonide-formoterol (SYMBICORT) 160-4.5 MCG/ACT inhaler Inhale 2 puffs into the lungs 2 (two) times daily as needed (Shortness of Breath).   . cetirizine (ZYRTEC) 10 MG tablet Take 10 mg by mouth at bedtime as needed for allergies.   Marland Kitchen colchicine 0.6 MG tablet Take 0.5 tablets (0.3 mg total) by mouth daily.  . furosemide (LASIX) 40 MG tablet Take 1 tablet by mouth daily.  . hydrALAZINE (  APRESOLINE) 25 MG tablet Take 25 mg by mouth 3 (three) times daily.  . Insulin Pen Needle (PEN NEEDLES 3/16") 31G X 5 MM MISC Use as directed qhs  . iron polysaccharides (NIFEREX) 150 MG capsule Take 150 mg by mouth 2 (two) times daily.   Marland Kitchen LANTUS SOLOSTAR 100 UNIT/ML Solostar Pen INJECT 40 UNITS SUBCUTANEOUSLY AT BEDTIME.  . mometasone (NASONEX) 50 MCG/ACT nasal spray Place 2 sprays into the nose 2 (two) times daily as needed (allergies).   . pantoprazole (PROTONIX) 40 MG tablet TAKE 1 TABLET TWICE DAILY 30 MINUTES PRIOR TO MEALS  . predniSONE (DELTASONE) 20 MG tablet Take 2 po QD x 3d then 1 po QD x 4d  . simvastatin (ZOCOR) 20 MG tablet Take 20 mg by mouth every morning.   . TRADJENTA 5 MG TABS tablet TAKE 1 TABLET EVERY DAY  . [DISCONTINUED] Cholecalciferol (VITAMIN D) 2000 units tablet Take 2,000 Units by mouth daily.  . [DISCONTINUED] diclofenac sodium (VOLTAREN) 1 % GEL Apply 4 g topically 4 (four) times daily. To left knee (Patient not taking: Reported on 09/12/2017)   No facility-administered encounter medications on file as of 11/05/2017.    ALLERGIES: Allergies  Allergen Reactions  . Opana [Oxymorphone Hcl] Itching  . Tramadol Itching   VACCINATION STATUS: Immunization History  Administered Date(s) Administered  . Td 03/27/2011    Diabetes  He presents for his follow-up diabetic visit. He has type 2 diabetes mellitus. Onset time: He was diagnosed approximately 20 years ago. His disease course has been stable. There are no hypoglycemic associated symptoms. Pertinent  negatives for hypoglycemia include no confusion, headaches, pallor or seizures. Associated symptoms include polyuria. Pertinent negatives for diabetes include no chest pain, no fatigue, no polydipsia, no polyphagia and no weakness. There are no hypoglycemic complications. Symptoms are stable. Diabetic complications include nephropathy. Risk factors for coronary artery disease include dyslipidemia, diabetes mellitus, hypertension, obesity, sedentary lifestyle, tobacco exposure and male sex. Current diabetic treatment includes insulin injections and oral agent (monotherapy). He is compliant with treatment most of the time. His weight is stable. He is following a generally unhealthy diet. He has had a previous visit with a dietitian. He rarely participates in exercise. His breakfast blood glucose range is generally 110-130 mg/dl. His overall blood glucose range is 110-130 mg/dl. Eye exam is current.  Hypertension  This is a chronic problem. The current episode started more than 1 year ago. The problem is controlled. Pertinent negatives include no chest pain, headaches, neck pain, palpitations or shortness of breath. Risk factors for coronary artery disease include diabetes mellitus, dyslipidemia, obesity, smoking/tobacco exposure, sedentary lifestyle and male gender. Past treatments include angiotensin blockers. Hypertensive end-organ damage includes kidney disease.  Hyperlipidemia  This is a chronic problem. The current episode started more than 1 year ago. Exacerbating diseases include diabetes and obesity. Pertinent negatives include no chest pain, myalgias or shortness of breath. Current antihyperlipidemic treatment includes statins. Risk factors for coronary artery disease include diabetes mellitus, dyslipidemia, hypertension, male sex and a sedentary lifestyle.     Review of Systems  Constitutional: Negative for fatigue and unexpected weight change.  HENT: Negative for dental problem, mouth sores and  trouble swallowing.   Eyes: Negative for visual disturbance.  Respiratory: Negative for cough, choking, chest tightness, shortness of breath and wheezing.   Cardiovascular: Negative for chest pain, palpitations and leg swelling.  Gastrointestinal: Negative for abdominal distention, abdominal pain, constipation, diarrhea, nausea and vomiting.  Endocrine: Positive for polyuria. Negative for polydipsia and  polyphagia.  Genitourinary: Negative for dysuria, flank pain, hematuria and urgency.  Musculoskeletal: Negative for back pain, gait problem, myalgias and neck pain.  Skin: Negative for pallor, rash and wound.  Neurological: Negative for seizures, syncope, weakness, numbness and headaches.  Psychiatric/Behavioral: Negative for confusion and dysphoric mood.    Objective:    BP 138/77   Pulse 67   Ht 5\' 4"  (1.626 m)   Wt 235 lb (106.6 kg)   BMI 40.34 kg/m   Wt Readings from Last 3 Encounters:  11/05/17 235 lb (106.6 kg)  10/09/17 237 lb (107.5 kg)  09/12/17 244 lb (110.7 kg)    Physical Exam  Constitutional: He is oriented to person, place, and time. He appears well-developed. He is cooperative. No distress.  HENT:  Head: Normocephalic and atraumatic.  Eyes: EOM are normal.  Neck: Normal range of motion. Neck supple. No tracheal deviation present. No thyromegaly present.  Cardiovascular: Normal rate, S1 normal and S2 normal. Exam reveals no gallop.  No murmur heard. Pulses:      Dorsalis pedis pulses are 1+ on the right side, and 1+ on the left side.       Posterior tibial pulses are 1+ on the right side, and 1+ on the left side.  Pulmonary/Chest: Effort normal. No respiratory distress. He has no wheezes.  Abdominal: He exhibits no distension. There is no tenderness. There is no guarding and no CVA tenderness.  Musculoskeletal: He exhibits no edema.       Right shoulder: He exhibits no swelling and no deformity.  Neurological: He is alert and oriented to person, place, and time.  He has normal strength and normal reflexes. No cranial nerve deficit or sensory deficit. Gait normal.  Skin: Skin is warm and dry. No rash noted. No cyanosis. Nails show no clubbing.  Psychiatric: He has a normal mood and affect. His speech is normal. Judgment normal. Cognition and memory are normal.    Results for orders placed or performed during the hospital encounter of 10/09/17  Uric acid  Result Value Ref Range   Uric Acid, Serum 8.8 (H) 4.4 - 7.6 mg/dL  CBG monitoring, ED  Result Value Ref Range   Glucose-Capillary 90 65 - 99 mg/dL   Diabetic Labs (most recent): Lab Results  Component Value Date   HGBA1C 6.2 (H) 10/23/2017   HGBA1C 6.1 (H) 06/26/2017   HGBA1C 6.3 (H) 03/28/2017   Lipid Panel     Component Value Date/Time   CHOL 110 06/26/2017 1106   TRIG 101 06/26/2017 1106   HDL 38 (L) 06/26/2017 1106   CHOLHDL 2.9 06/26/2017 1106   VLDL 38 (H) 06/16/2016 0956   LDLCALC 54 06/26/2017 1106     Assessment & Plan:   1. Type 2 diabetes mellitus with stage 4 chronic kidney disease, with long-term current use of insulin (HCC)  -His  Diabetes is complicated by end-stage renal disease status post fistula placement for hemodialysis,  and patient remains at a high risk for more acute and chronic complications of diabetes which include CAD, CVA, CKD, retinopathy, and neuropathy. These are all discussed in detail with the patient.  Patient came with controlled glycemic profile and A1c of 6.2%, generally improving from 7.4%.     Recent labs reviewed. - I have re-counseled the patient on diet management and weight loss  by adopting a carbohydrate restricted / protein rich  Diet.  -  Suggestion is made for him to avoid simple carbohydrates  from his diet including Cakes,  Sweet Desserts / Pastries, Ice Cream, Soda (diet and regular), Sweet Tea, Candies, Chips, Cookies, Store Bought Juices, Alcohol in Excess of  1-2 drinks a day, Artificial Sweeteners, and "Sugar-free" Products.  This will help patient to have stable blood glucose profile and potentially avoid unintended weight gain.  - Patient is advised to stick to a routine mealtimes to eat 3 meals  a day and avoid unnecessary snacks ( to snack only to correct hypoglycemia). I have also advised him to cut back on alcohol consumption.   - I have approached patient with the following individualized plan to manage diabetes and patient agrees.   -He is advised to stay away from ETOH and smoking. -Based on his glycemic profile, he will continue to need at least basal insulin therapy.  - I advised him to continue Lantus 30 units nightly associated with monitoring of blood glucose 2 times a day-daily before breakfast and at bedtime. - I advised him to stay off of Humalog for now . Continue Tradjenta 5mg  po qday.   He is following with Jearld Fenton, CDE for DM education.   -He will continue to f/u with Nephrology given stage 4-5 CKD, he has a dialysis fistula in place.  -Patient is not a candidate for metformin and SGLT2 inhibitors due to CKD.  - Patient specific target  for A1c; LDL, HDL, Triglycerides, and  Waist Circumference were discussed in detail.  2) BP/HTN: His blood pressure is controlled to target.   I advised him his current blood pressure medications including ACEI/ARB. 3) Lipids/HPL: Uncontrolled lipid panel with LDL at 54, he is advised continue statins. 4)  Weight/Diet: He has lost 13 pounds since last visit, CDE consult in progress, exercise, and carbohydrates information provided.  5) Vitamin D deficiency - He is status post therapy with vitamin D 50,000 weekly for 12 weeks, currently on vitamin D3 5,000 units daily. His insurance would not cover for repeat vitamin D test.   6) Chronic Care/Health Maintenance:  -Patient is on ACEI/ARB and Statin medications and encouraged to continue to follow up with Ophthalmology, nephrology (he is status post fistula placement in prep for hemodialysis)  Podiatrist at least yearly or according to recommendations, and advised to quit smoking. I have recommended yearly flu vaccine and pneumonia vaccination at least every 5 years; moderate intensity exercise for up to 150 minutes weekly; and  sleep for at least 7 hours a day. - I advised patient to maintain close follow up with his PCP for primary care needs.  - Time spent with the patient: 25 min, of which >50% was spent in reviewing his blood glucose logs , discussing his hypo- and hyper-glycemic episodes, reviewing his current and  previous labs and insulin doses and developing a plan to avoid hypo- and hyper-glycemia. Please refer to Patient Instructions for Blood Glucose Monitoring and Insulin/Medications Dosing Guide"  in media tab for additional information. [name] [name] participated in the discussions, expressed understanding, and voiced agreement with the above plans.  All questions were answered to his satisfaction. he is encouraged to contact clinic should he have any questions or concerns prior to his return visit.    Follow up plan: -Return in about 4 months (around 03/07/2018) for meter, and logs.  Glade Lloyd, MD Phone: [phone]  Fax: [phone]   This note was partially dictated with voice recognition software. Similar sounding words can be transcribed inadequately or may not  be corrected upon review.  11/05/2017, 1:03 PM

## 2017-11-05 NOTE — Patient Instructions (Signed)

## 2017-11-08 ENCOUNTER — Encounter: Payer: Self-pay | Admitting: Gastroenterology

## 2017-11-13 ENCOUNTER — Encounter: Payer: Self-pay | Admitting: Internal Medicine

## 2017-11-13 ENCOUNTER — Ambulatory Visit (INDEPENDENT_AMBULATORY_CARE_PROVIDER_SITE_OTHER): Payer: Medicare Other | Admitting: Internal Medicine

## 2017-11-13 VITALS — BP 136/78 | HR 75 | Ht 64.0 in | Wt 236.4 lb

## 2017-11-13 DIAGNOSIS — N186 End stage renal disease: Secondary | ICD-10-CM | POA: Diagnosis not present

## 2017-11-13 DIAGNOSIS — I2510 Atherosclerotic heart disease of native coronary artery without angina pectoris: Secondary | ICD-10-CM

## 2017-11-13 DIAGNOSIS — I10 Essential (primary) hypertension: Secondary | ICD-10-CM | POA: Diagnosis not present

## 2017-11-13 DIAGNOSIS — E782 Mixed hyperlipidemia: Secondary | ICD-10-CM

## 2017-11-13 DIAGNOSIS — Z9989 Dependence on other enabling machines and devices: Secondary | ICD-10-CM | POA: Diagnosis not present

## 2017-11-13 DIAGNOSIS — G4733 Obstructive sleep apnea (adult) (pediatric): Secondary | ICD-10-CM | POA: Diagnosis not present

## 2017-11-13 NOTE — Patient Instructions (Signed)
Your physician wants you to follow-up in: ONE YEAR with Dr. Hilty. You will receive a reminder letter in the mail two months in advance. If you don't receive a letter, please call our office to schedule the follow-up appointment.  

## 2017-11-13 NOTE — Progress Notes (Signed)
OFFICE NOTE  Chief Complaint:    Primary Care Physician: [name], MD  HPI:  [name] [name] Is a pleasant 67 year old male with a history of insulin-dependent diabetes, dyslipidemia, and hypertension. He's been a diabetic since 1999. He's been on insulin for only 3-4 years. He was a former patient of Dr. Rollene Fare who ordered a stress test in 2014. This demonstrated reversible inferoapical ischemia and was read as moderate risk. He ultimately underwent heart catheterization by Dr. Ellyn Hack on 03/28/2013 which demonstrated a small, possibly minimal aneurysmal bleb in the LAD but no significant disease. There is a dominant RCA with no disease. Essentially there was no significant obstructive coronary disease. At the time his EKG did show inferior T-wave abnormalities and very small R waves but no evidence for Q waves. Today he is referred back for abnormalities EKG which directly compared to his old EKG shows no significant change. There is an IVCD and very small R waves inferiorly, which the computer may have this read as inferior infarct. I do not see any significant change compared to his previous studies. He denies any new or worsening chest pain or shortness of breath.  [name] returns today for follow-up. Overall he is feeling fairly well. He denies any chest pain or shortness of breath. Blood pressure is slightly elevated to 162/94, however recheck was 140/84. Please not been able to lose much weight. He is on chronic Plavix therapy due to what was thought to be plaque rupture but did not require stent by catheterization in 2014. This was a preferred agent over aspirin and he's remained on that, but certainly could come off of that if he were to undergo any procedures he is contemplating a penile implant for erectile dysfunction.  05/01/2016  [name] was seen today in follow-up. He recently saw [name], NP, in April 2016 for follow-up. Blood pressure was somewhat elevated  at the time. It was not is ambulatory due to problems with his knee. There was a discussion about stopping Plavix and keeping him on aspirin however he remained on Plavix. Unfortunately he has stage IV chronic kidney disease and there is discussion about placement of a fistula. He scheduled for upper extremity Dopplers at the end of this month and follow-up with Dr. Oneida Alar. He had a number of questions today regarding dialysis and his fistula, but I reassured him based on his creatinine that he should consider this even if his numbers seem to have stabilized. He denies any chest pain or worsening shortness of breath. He did get new CPAP equipment but reports intermittent compliance with it. He still has some daytime fatigue.  10/27/2016  [name] was seen today in follow-up. He underwent recent placement of a fistula left upper arm. This has a positive thrill today. Blood pressure is elevated somewhat 152/88. Recently his creatinine was increased up to 5.08 which is progression from 3.74 in December. I suspect he will have to start on dialysis soon. Otherwise hemoglobin A1c is stable around 7.0. He is cholesterol also has looked reasonably well controlled with total cholesterol 146, HDL-C 36, LDL-C 72 and triglycerides 190.  11/13/2017  [name] was seen today for routine follow-up.  Overall he is doing very well.  He denies any chest pain or worsening shortness of breath.  His creatinine remains around 5.7.  This is been fairly stable and he is managed to remain off dialysis.  He does have left upper extremity fistula.  Hemoglobin A1c is well-controlled  at 6.2 and is followed by Dr. Dorris Fetch in [place].  His total cholesterol was 110, HDL 38, LDL 54 triglycerides 101, demonstrating excellent control on simvastatin.  Blood pressure is at goal today 136/78.  PMHx:  Past Medical History:  Diagnosis Date  . Anemia   . Arthritis   . Bell palsy   . CAD (coronary artery disease)    a. 2014 MV: abnl w/  infap ischemia; b. 03/2013 Cath: aneurysmal bleb in the LAD w/ otw nonobs dzs-->Med Rx.  . Chronic back pain   . Chronic knee pain    a. 09/2015 s/p R TKA.  Marland Kitchen Chronic pain   . Chronic shoulder pain   . Chronic sinusitis   . CKD (chronic kidney disease), stage IV ([place])    stage 4 per office visit note of Dr Lowanda Foster on 05/2015   . COPD (chronic obstructive pulmonary disease) ([place])   . Diabetes mellitus without complication ([name])   . Dyspnea   . Essential hypertension   . GERD (gastroesophageal reflux disease)   . Hepatomegaly    noted on noncontrast CT 2015  . History of hiatal hernia   . Hyperlipidemia   . Lateral meniscus tear   . Obesity    [place]  . Obstructive sleep apnea    cpap - settings at 4   . On home oxygen therapy    uses 2l when is going somewhere per patient   . Pneumonia   . Post traumatic stress disorder   . PUD (peptic ulcer disease)    remote, reports f/u EGD about 8 years ago unremarkable   . Reactive airway disease    related to exposure to chemical during 9/11  . Sinusitis   . Vitamin D deficiency     Past Surgical History:  Procedure Laterality Date  . ASAD LT SHOULDER  12/2008   left shoulder  . AV FISTULA PLACEMENT Left 08/09/2016   Procedure: BRACHIOCEPHALIC ARTERIOVENOUS (AV) FISTULA CREATION LEFT ARM;  Surgeon: [name], MD;  Location: [place];  Service: Vascular;  Laterality: Left;  . CATARACT EXTRACTION W/PHACO Left 03/28/2016   Procedure: CATARACT EXTRACTION PHACO AND INTRAOCULAR LENS PLACEMENT LEFT EYE;  Surgeon: [name], MD;  Location: [facility];  Service: Ophthalmology;  Laterality: Left;  CDE: 4.77  . CATARACT EXTRACTION W/PHACO Right 04/11/2016   Procedure: CATARACT EXTRACTION PHACO AND INTRAOCULAR LENS PLACEMENT RIGHT EYE; CDE:  4.74;  Surgeon: [name], MD;  Location: [facility];  Service: Ophthalmology;  Laterality: Right;  . COLONOSCOPY  10/2008   Fields: Rectal polyp obliterated, not retrieved, hemorrhoids, single ascending colon  diverticulum near the CV. Next colonoscopy April 2020  . COLONOSCOPY N/A 12/25/2014   SLF: 1. Colorectal polyps (2) removed 2. Small internal hemorrhoids 3. the left colon is severely redundant  . DOPPLER ECHOCARDIOGRAPHY    . ESOPHAGOGASTRODUODENOSCOPY N/A 12/25/2014   SLF: 1. Anemia most likely due to CRI, gastritis, gastric polyps 2. Moderate non-erosive gastriits and mild duodenitis.  3.TWo large gstric polyps removed.   Marland Kitchen EYE SURGERY  12/22/2010   tear duct probing-[place]  . FOREIGN BODY REMOVAL  03/29/2011   Procedure: REMOVAL FOREIGN BODY EXTREMITY;  Surgeon: Arther Abbott, MD;  Location: [facility];  Service: Orthopedics;  Laterality: Right;  Removal Foreign Body Right Thumb  . KNEE ARTHROSCOPY  10/2007   left  . KNEE ARTHROSCOPY WITH LATERAL MENISECTOMY Right 10/14/2015   Procedure: LEFT KNEE ARTHROSCOPY WITH PARTIAL LATERAL MENISECTOMY;  Surgeon: Carole Civil, MD;  Location: [facility];  Service: Orthopedics;  Laterality: Right;  . LEFT HEART CATHETERIZATION WITH CORONARY ANGIOGRAM N/A 03/28/2013   Procedure: LEFT HEART CATHETERIZATION WITH CORONARY ANGIOGRAM;  Surgeon: Leonie Man, MD;  Location[facility] C[facility];  Service: Cardiovascular;  Laterality: N/A;  . NM MYOVIEW LTD    . PENILE PROSTHESIS IMPLANT N/A 08/16/2015   Procedure: PENILE PROTHESIS INFLATABLE, three piece, Excisional biopsy of Penile ulcer, Penile molding;  Surgeon: Carolan Clines, MD;  Location: [facility];  Service: Urology;  Laterality: N/A;  . QUADRICEPS TENDON REPAIR  07/21/2011   Procedure: REPAIR QUADRICEP TENDON;  Surgeon: Arther Abbott, MD;  Location: [facility];  Service: Orthopedics;  Laterality: Right;  . TOENAIL EXCISION     removed [id number]  . UMBILICAL HERNIA REPAIR  2007   [place]    FAMHx:  Family History  Problem Relation Age of Onset  . Hypertension Mother        MI  . Cancer Mother        breast   . Diabetes Mother   . Diabetes Father   . Hypertension Father   . Hypertension Sister   .  Diabetes Sister   . Arthritis Unknown   . Asthma Unknown   . Lung disease Unknown   . Anesthesia problems Neg Hx   . Hypotension Neg Hx   . Malignant hyperthermia Neg Hx   . Pseudochol deficiency Neg Hx   . Colon cancer Neg Hx     SOCHx:   reports that he quit smoking about 7 years ago. His smoking use included cigarettes. He has a 25.00 pack-year smoking history. He has never used smokeless tobacco. He reports that he drinks alcohol. He reports that he has current or past drug history. Drug: Marijuana.  ALLERGIES:  Allergies  Allergen Reactions  . Opana [Oxymorphone Hcl] Itching  . Tramadol Itching    ROS: A comprehensive review of systems was negative.  HOME MEDS: Current Outpatient Medications  Medication Sig Dispense Refill  . amLODipine (NORVASC) 10 MG tablet Take 10 mg by mouth daily.     Marland Kitchen aspirin EC 81 MG tablet Take 1 tablet (81 mg total) by mouth daily. 90 tablet 3  . budesonide-formoterol (SYMBICORT) 160-4.5 MCG/ACT inhaler Inhale 2 puffs into the lungs 2 (two) times daily as needed (Shortness of Breath).     . cetirizine (ZYRTEC) 10 MG tablet Take 10 mg by mouth at bedtime as needed for allergies.     Marland Kitchen colchicine 0.6 MG tablet Take 0.5 tablets (0.3 mg total) by mouth daily. 10 tablet 0  . furosemide (LASIX) 40 MG tablet Take 1 tablet by mouth daily.    . hydrALAZINE (APRESOLINE) 25 MG tablet Take 25 mg by mouth 3 (three) times daily.    . Insulin Pen Needle (PEN NEEDLES 3/16") 31G X 5 MM MISC Use as directed qhs 100 each 5  . iron polysaccharides (NIFEREX) 150 MG capsule Take 150 mg by mouth 2 (two) times daily.     Marland Kitchen LANTUS SOLOSTAR 100 UNIT/ML Solostar Pen INJECT 40 UNITS SUBCUTANEOUSLY AT BEDTIME. 45 mL 0  . mometasone (NASONEX) 50 MCG/ACT nasal spray Place 2 sprays into the nose 2 (two) times daily as needed (allergies).     . pantoprazole (PROTONIX) 40 MG tablet TAKE 1 TABLET TWICE DAILY 30 MINUTES PRIOR TO MEALS 180 tablet 3  . predniSONE (DELTASONE) 20 MG  tablet Take 2 po QD x 3d then 1 po QD x 4d 10 tablet 0  . simvastatin (ZOCOR) 20  MG tablet Take 20 mg by mouth every morning.     . TRADJENTA 5 MG TABS tablet TAKE 1 TABLET EVERY DAY 90 tablet 0   No current facility-administered medications for this visit.     LABS/IMAGING: No results found for this or any previous visit (from the past 48 hour(s)). No results found.  VITALS: There were no vitals taken for this visit.  EXAM: General appearance: alert and no distress Neck: no carotid bruit and no JVD Lungs: clear to auscultation bilaterally Heart: regular rate and rhythm, S1, S2 normal, no murmur, click, rub or gallop Abdomen: soft, non-tender; bowel sounds normal; no masses,  no organomegaly and morbidly obse Extremities: extremities normal, atraumatic, no cyanosis or edema Pulses: 2+ and symmetric Skin: Skin color, texture, turgor normal. No rashes or lesions Neurologic: Grossly normal Psyvh: Normal  EKG: Times rhythm first-degree AV block, nonspecific T wave changes 75-personally reviewed  ASSESSMENT: 1. Mild coronary artery disease by cath in September 2014, question plaque rupture-on Plavix (no stent) 2. Insulin-dependent diabetes 3. Dyslipidemia-controlled 4. Hypertension 5. Morbid obesity 6. Erectile dysfunction - s/p implant 7. OSA on CPAP 8. ESRD - s/p LUE AV fistula, not yet on dialysis  PLAN: 1.   Mr. Lyford is doing well without any new cardiac symptoms.  His hemoglobin A1c 6.2 demonstrate good control.  Cholesterol is at goal with LDL less than 70.  His blood pressure is at goal as well.  Is compliant with CPAP.  He does have a left upper extremity fistula but creatinine is remained around 5.7 and he has not gone on dialysis.  I have encouraged him to continue work on exercise and weight loss.  Follow-up with me annually or sooner as necessary.  Pixie Casino, MD, [facility], [place] Director of the [facility]id [facility] &    Cardiovascular [facility] Clinic Diplomate of the American Board of Clinical Lipidology Attending Cardiologist  Direct Dial: [phone]  Fax: [phone]  Website:  [email or web address] 11/13/2017, 8:06 AM

## 2017-11-19 ENCOUNTER — Encounter (HOSPITAL_COMMUNITY): Payer: Self-pay | Admitting: *Deleted

## 2017-11-19 ENCOUNTER — Other Ambulatory Visit: Payer: Self-pay | Admitting: Urology

## 2017-11-22 ENCOUNTER — Other Ambulatory Visit: Payer: Self-pay

## 2017-11-22 ENCOUNTER — Encounter (HOSPITAL_COMMUNITY): Payer: Self-pay | Admitting: Emergency Medicine

## 2017-11-22 ENCOUNTER — Emergency Department (HOSPITAL_COMMUNITY)
Admission: EM | Admit: 2017-11-22 | Discharge: 2017-11-22 | Disposition: A | Discharge status: Home / Self Care | Payer: Medicare Other | Attending: Emergency Medicine | Admitting: Emergency Medicine

## 2017-11-22 DIAGNOSIS — Z79899 Other long term (current) drug therapy: Secondary | ICD-10-CM | POA: Diagnosis not present

## 2017-11-22 DIAGNOSIS — I129 Hypertensive chronic kidney disease with stage 1 through stage 4 chronic kidney disease, or unspecified chronic kidney disease: Secondary | ICD-10-CM | POA: Diagnosis not present

## 2017-11-22 DIAGNOSIS — Z7982 Long term (current) use of aspirin: Secondary | ICD-10-CM | POA: Insufficient documentation

## 2017-11-22 DIAGNOSIS — M10072 Idiopathic gout, left ankle and foot: Secondary | ICD-10-CM | POA: Insufficient documentation

## 2017-11-22 DIAGNOSIS — I2510 Atherosclerotic heart disease of native coronary artery without angina pectoris: Secondary | ICD-10-CM | POA: Diagnosis not present

## 2017-11-22 DIAGNOSIS — N184 Chronic kidney disease, stage 4 (severe): Secondary | ICD-10-CM | POA: Insufficient documentation

## 2017-11-22 DIAGNOSIS — J449 Chronic obstructive pulmonary disease, unspecified: Secondary | ICD-10-CM | POA: Insufficient documentation

## 2017-11-22 DIAGNOSIS — Z87891 Personal history of nicotine dependence: Secondary | ICD-10-CM | POA: Diagnosis not present

## 2017-11-22 DIAGNOSIS — M79672 Pain in left foot: Secondary | ICD-10-CM | POA: Diagnosis present

## 2017-11-22 HISTORY — DX: Gout, unspecified: M10.9

## 2017-11-22 LAB — CBG MONITORING, ED: GLUCOSE-CAPILLARY: 168 mg/dL — AB (ref 65–99)

## 2017-11-22 MED ORDER — HYDROCODONE-ACETAMINOPHEN 5-325 MG PO TABS
1.0000 | ORAL_TABLET | Freq: Once | ORAL | Status: AC
  Administered 2017-11-22: 1 via ORAL
  Filled 2017-11-22: qty 1

## 2017-11-22 MED ORDER — HYDROCODONE-ACETAMINOPHEN 5-325 MG PO TABS: 1.0000 | ORAL_TABLET | ORAL | 0 refills | Status: DC | PRN

## 2017-11-22 MED ORDER — PREDNISONE 10 MG PO TABS: ORAL_TABLET | ORAL | 0 refills | Status: DC

## 2017-11-22 MED ORDER — DEXAMETHASONE SODIUM PHOSPHATE 10 MG/ML IJ SOLN
10.0000 mg | Freq: Once | INTRAMUSCULAR | Status: AC
  Administered 2017-11-22: 10 mg via INTRAMUSCULAR
  Filled 2017-11-22: qty 1

## 2017-11-22 NOTE — ED Triage Notes (Signed)
Pt c/o of swollen right foot and ankle. Denies any new injuries.

## 2017-11-22 NOTE — Discharge Instructions (Addendum)
Take your next dose of prednisone tomorrow morning as you have received this medication in the shot you received today. You may take the hydrocodone prescribed for pain relief.  This will make you drowsy - do not drive within 4 hours of taking this medication.  As discussed, elevation and a mild heating pad applied for no more than 10 minutes several times daily can also help eliminate this gout flare quicker.

## 2017-11-23 ENCOUNTER — Encounter (HOSPITAL_COMMUNITY): Payer: Self-pay

## 2017-11-23 NOTE — ED Provider Notes (Addendum)
[facility] EMERGENCY DEPARTMENT Provider Note   CSN: [id number] Arrival date & time: 11/22/17  1224     History   Chief Complaint Chief Complaint  Patient presents with  . Foot Pain    HPI [name] [name] is a 67 y.o. male.  The history is provided by the patient.  Foot Pain  This is a recurrent problem. The current episode started yesterday. The problem occurs constantly. The problem has been gradually worsening. Pertinent negatives include no chest pain, no abdominal pain, no headaches and no shortness of breath. The symptoms are aggravated by walking and standing. Nothing relieves the symptoms. Treatments tried: Has a recent diagnosis of gout with last episode occuring also in the left ankle. Currently on daily allopurinol. The treatment provided no relief.    Past Medical History:  Diagnosis Date  . Anemia   . Arthritis   . Bell palsy   . CAD (coronary artery disease)    a. 2014 MV: abnl w/ infap ischemia; b. 03/2013 Cath: aneurysmal bleb in the LAD w/ otw nonobs dzs-->Med Rx.  . Chronic back pain   . Chronic knee pain    a. 09/2015 s/p R TKA.  Marland Kitchen Chronic pain   . Chronic shoulder pain   . Chronic sinusitis   . CKD (chronic kidney disease), stage IV ([place])    stage 4 per office visit note of Dr Lowanda Foster on 05/2015   . COPD (chronic obstructive pulmonary disease) ([employer])   . Diabetes mellitus without complication ([place])   . Dyspnea   . Essential hypertension   . GERD (gastroesophageal reflux disease)   . Gout   . Hepatomegaly    noted on noncontrast CT 2015  . History of hiatal hernia   . Hyperlipidemia   . Lateral meniscus tear   . Obesity    [place]  . Obstructive sleep apnea    cpap - settings at 4   . On home oxygen therapy    uses 2l when is going somewhere per patient   . Pneumonia   . Post traumatic stress disorder   . PUD (peptic ulcer disease)    remote, reports f/u EGD about 8 years ago unremarkable   . Reactive airway disease    related to exposure  to chemical during 9/11  . Sinusitis   . Vitamin D deficiency     Patient Active Problem List   Diagnosis Date Noted  . End stage renal disease ([place]) 11/13/2017  . Foul smelling urine 12/14/2016  . Coronary artery disease involving native coronary artery of native heart without angina pectoris 10/27/2016  . Chronic kidney disease, stage IV (severe) ([place]) 10/27/2016  . Morbid obesity due to excess calories ([place]) 03/22/2016  . CAD (coronary artery disease)   . Erectile dysfunction 08/16/2015  . Hemorrhoids 07/05/2015  . Vitamin D deficiency 06/04/2015  . OSA on CPAP 05/14/2015  . Normocytic anemia 03/24/2015  . Fatty liver 12/01/2014  . Back pain 06/05/2013  . OA (osteoarthritis) of knee 03/15/2011  . CTS (carpal tunnel syndrome) 03/15/2011  . Type 2 diabetes mellitus with stage 4 chronic kidney disease, with long-term current use of insulin ([place]) 09/29/2008  . Mixed hyperlipidemia 09/29/2008  . Essential hypertension 09/29/2008  . GERD 09/29/2008    Past Surgical History:  Procedure Laterality Date  . ASAD LT SHOULDER  12/2008   left shoulder  . AV FISTULA PLACEMENT Left 08/09/2016   Procedure: BRACHIOCEPHALIC ARTERIOVENOUS (AV) FISTULA CREATION LEFT ARM;  Surgeon: [name],  MD;  Location: [facility];  Service: Vascular;  Laterality: Left;  . CATARACT EXTRACTION W/PHACO Left 03/28/2016   Procedure: CATARACT EXTRACTION PHACO AND INTRAOCULAR LENS PLACEMENT LEFT EYE;  Surgeon: [name], MD;  Location: [facility];  Service: Ophthalmology;  Laterality: Left;  CDE: 4.77  . CATARACT EXTRACTION W/PHACO Right 04/11/2016   Procedure: CATARACT EXTRACTION PHACO AND INTRAOCULAR LENS PLACEMENT RIGHT EYE; CDE:  4.74;  Surgeon: [name], MD;  Location: [facility];  Service: Ophthalmology;  Laterality: Right;  . COLONOSCOPY  10/2008   Fields: Rectal polyp obliterated, not retrieved, hemorrhoids, single ascending colon diverticulum near the CV. Next colonoscopy April 2020  . COLONOSCOPY N/A  12/25/2014   SLF: 1. Colorectal polyps (2) removed 2. Small internal hemorrhoids 3. the left colon is severely redundant  . DOPPLER ECHOCARDIOGRAPHY    . ESOPHAGOGASTRODUODENOSCOPY N/A 12/25/2014   SLF: 1. Anemia most likely due to CRI, gastritis, gastric polyps 2. Moderate non-erosive gastriits and mild duodenitis.  3.TWo large gstric polyps removed.   Marland Kitchen EYE SURGERY  12/22/2010   tear duct probing-  . FOREIGN BODY REMOVAL  03/29/2011   Procedure: REMOVAL FOREIGN BODY EXTREMITY;  Surgeon: Arther Abbott, MD;  Location: [facility];  Service: Orthopedics;  Laterality: Right;  Removal Foreign Body Right Thumb  . KNEE ARTHROSCOPY  10/2007   left  . KNEE ARTHROSCOPY WITH LATERAL MENISECTOMY Right 10/14/2015   Procedure: LEFT KNEE ARTHROSCOPY WITH PARTIAL LATERAL MENISECTOMY;  Surgeon: Carole Civil, MD;  Location: [facility];  Service: Orthopedics;  Laterality: Right;  . LEFT HEART CATHETERIZATION WITH CORONARY ANGIOGRAM N/A 03/28/2013   Procedure: LEFT HEART CATHETERIZATION WITH CORONARY ANGIOGRAM;  Surgeon: Leonie Man, MD;  Location: [facility] CATH LAB;  Service: Cardiovascular;  Laterality: N/A;  . NM MYOVIEW LTD    . PENILE PROSTHESIS IMPLANT N/A 08/16/2015   Procedure: PENILE PROTHESIS INFLATABLE, three piece, Excisional biopsy of Penile ulcer, Penile molding;  Surgeon: Carolan Clines, MD;  Location: [facility];  Service: Urology;  Laterality: N/A;  . QUADRICEPS TENDON REPAIR  07/21/2011   Procedure: REPAIR QUADRICEP TENDON;  Surgeon: Arther Abbott, MD;  Location: [facility];  Service: Orthopedics;  Laterality: Right;  . TOENAIL EXCISION     removed [id number]  . UMBILICAL HERNIA REPAIR  2007   [place]        Home Medications    Prior to Admission medications   Medication Sig Start Date End Date Taking? Authorizing Provider  acetaminophen (TYLENOL) 650 MG CR tablet Take 1,300 mg by mouth daily as needed for pain.    [provider]  allopurinol (ZYLOPRIM) 100 MG tablet Take 100  mg by mouth daily.    [provider]  amLODipine (NORVASC) 10 MG tablet Take 10 mg by mouth daily.     [provider]  aspirin EC 81 MG tablet Take 1 tablet (81 mg total) by mouth daily. 05/01/16   Hilty, Nadean Corwin, MD  budesonide-formoterol (SYMBICORT) 160-4.5 MCG/ACT inhaler Inhale 2 puffs into the lungs 2 (two) times daily as needed (Shortness of Breath).     [provider]  calcitRIOL (ROCALTROL) 0.25 MCG capsule Take 0.25 mcg by mouth daily.    [provider]  cetirizine (ZYRTEC) 10 MG tablet Take 10 mg by mouth at bedtime as needed for allergies.     [provider]  colchicine 0.6 MG tablet Take 0.5 tablets (0.3 mg total) by mouth daily. Patient not taking: Reported on 11/21/2017 10/09/17   Rolland Porter, MD  diclofenac sodium (  VOLTAREN) 1 % GEL Apply 1 application topically 2 (two) times daily as needed (muscle pain).    [provider]  hydrALAZINE (APRESOLINE) 25 MG tablet Take 25 mg by mouth 3 (three) times daily. 06/29/16   [provider]  HYDROcodone-acetaminophen (NORCO/VICODIN) 5-325 MG tablet Take 1 tablet by mouth every 4 (four) hours as needed. 11/22/17   Evalee Jefferson, PA-C  Insulin Pen Needle (PEN NEEDLES 3/16") 31G X 5 MM MISC Use as directed qhs 02/05/17   Cassandria Anger, MD  LANTUS SOLOSTAR 100 UNIT/ML Solostar Pen INJECT 40 UNITS SUBCUTANEOUSLY AT BEDTIME. Patient taking differently: INJECT 30 UNITS SUBCUTANEOUSLY AT BEDTIME. 06/09/17   Angelia Mould, MD  mometasone (NASONEX) 50 MCG/ACT nasal spray Place 2 sprays into the nose 2 (two) times daily as needed (allergies).     [provider]  pantoprazole (PROTONIX) 40 MG tablet TAKE 1 TABLET TWICE DAILY 30 MINUTES PRIOR TO MEALS Patient taking differently: Take 40 mg by mouth once daily 02/26/17   Annitta Needs, NP  predniSONE (DELTASONE) 10 MG tablet Take 2 tablets (starting 5/10) for 3 days, then take 1 tablet for 4 days. 11/22/17   Evalee Jefferson,  PA-C  simvastatin (ZOCOR) 20 MG tablet Take 20 mg by mouth every morning.     [provider]  TRADJENTA 5 MG TABS tablet TAKE 1 TABLET EVERY DAY 10/18/17   Cassandria Anger, MD    Family History Family History  Problem Relation Age of Onset  . Hypertension Mother        MI  . Cancer Mother        breast   . Diabetes Mother   . Diabetes Father   . Hypertension Father   . Hypertension Sister   . Diabetes Sister   . Arthritis Unknown   . Asthma Unknown   . Lung disease Unknown   . Anesthesia problems Neg Hx   . Hypotension Neg Hx   . Malignant hyperthermia Neg Hx   . Pseudochol deficiency Neg Hx   . Colon cancer Neg Hx     Social History Social History   Tobacco Use  . Smoking status: Former Smoker    Packs/day: 1.00    Years: 25.00    Pack years: 25.00    Types: Cigarettes    Last attempt to quit: 03/27/2010    Years since quitting: 7.6  . Smokeless tobacco: Never Used  . Tobacco comment: Quit x 7 years  Substance Use Topics  . Alcohol use: Yes    Alcohol/week: 0.0 oz    Comment: occasionally  . Drug use: Yes    Types: Marijuana    Comment: no cocaine for several years. Marijuana occ 06/21/17     Allergies   Opana [oxymorphone hcl] and Tramadol   Review of Systems Review of Systems  Constitutional: Negative for fever.  Respiratory: Negative for shortness of breath.   Cardiovascular: Negative for chest pain.  Gastrointestinal: Negative for abdominal pain.  Musculoskeletal: Positive for arthralgias and joint swelling. Negative for myalgias.  Neurological: Negative for weakness, numbness and headaches.     Physical Exam Updated Vital Signs BP 119/65 (BP Location: Right Arm)   Pulse 84   Temp 98 F (36.7 C) (Oral)   Resp 18   Ht 5\' 4"  (1.626 m)   Wt 106.6 kg (235 lb)   SpO2 96%   BMI 40.34 kg/m   Physical Exam  Constitutional: He appears well-developed and well-nourished.  HENT:  Head: Atraumatic.  Neck: Normal range of motion.    Cardiovascular:  Pulses:      Dorsalis pedis pulses are 2+ on the right side, and 2+ on the left side.  Pulses equal bilaterally  Musculoskeletal: He exhibits tenderness.       Left ankle: He exhibits decreased range of motion and swelling. He exhibits no ecchymosis, no deformity and normal pulse. Tenderness. Lateral malleolus and medial malleolus tenderness found.  Skin is warm to touch.  Neurological: He is alert. He has normal strength. He displays normal reflexes. No sensory deficit.  Skin: Skin is warm and dry.  Skin intact without punctures, lesions, rash or wounds.  Psychiatric: He has a normal mood and affect.     ED Treatments / Results  Labs (all labs ordered are listed, but only abnormal results are displayed) Labs Reviewed  CBG MONITORING, ED - Abnormal; Notable for the following components:      Result Value   Glucose-Capillary 168 (*)    All other components within normal limits    EKG None  Radiology No results found.  Procedures Procedures (including critical care time)  Medications Ordered in ED Medications  dexamethasone (DECADRON) injection 10 mg (10 mg Intramuscular Given 11/22/17 1414)  HYDROcodone-acetaminophen (NORCO/VICODIN) 5-325 MG per tablet 1 tablet (1 tablet Oral Given 11/22/17 1415)     Initial Impression / Assessment and Plan / ED Course  I have reviewed the triage vital signs and the nursing notes.  Pertinent labs & imaging results that were available during my care of the patient were reviewed by me and considered in my medical decision making (see chart for details).     Exam and hx suggesting recurrence of gouty flare.  He denies any injuries to the foot or ankle.  Pt was placed on prednisone and hydrocodone, first dose as decadron given here.  He was getting relief by the time of his departure, discussed home tx including elevation and warm compresses. Caution re sedation from hydrocodone. F/u with pcp if not improving with this  tx.  Discussed with Dr. Roderic Palau prior to dc home.  Final Clinical Impressions(s) / ED Diagnoses   Final diagnoses:  Acute idiopathic gout of left ankle    ED Discharge Orders        Ordered    HYDROcodone-acetaminophen (NORCO/VICODIN) 5-325 MG tablet  Every 4 hours PRN     11/22/17 1455    predniSONE (DELTASONE) 10 MG tablet     11/22/17 1455       Evalee Jefferson, PA-C 11/23/17 0648    Evalee Jefferson, PA-C 11/23/17 0076    Milton Ferguson, MD 11/23/17 [phone]

## 2017-11-23 NOTE — Progress Notes (Signed)
Clearance /LOV Dr Sinda Du 10-27-17 on chart   EKG 11-13-17 epic   [facility] cardiology Dr Debara Pickett 11-13-17 epic   ECHO 10-06-16 epic    hgbA1c 10-23-17 epic   CT chest 04-10-17 epic

## 2017-11-26 ENCOUNTER — Encounter (HOSPITAL_COMMUNITY): Payer: Self-pay

## 2017-11-26 ENCOUNTER — Other Ambulatory Visit: Payer: Self-pay

## 2017-11-26 ENCOUNTER — Encounter (HOSPITAL_COMMUNITY)
Admission: RE | Admit: 2017-11-26 | Discharge: 2017-11-26 | Disposition: A | Discharge status: Home / Self Care | Payer: Medicare Other | Source: Ambulatory Visit | Attending: Urology | Admitting: Urology

## 2017-11-26 DIAGNOSIS — Z01812 Encounter for preprocedural laboratory examination: Secondary | ICD-10-CM | POA: Diagnosis not present

## 2017-11-26 LAB — BASIC METABOLIC PANEL
ANION GAP: 12 (ref 5–15)
BUN: 75 mg/dL — ABNORMAL HIGH (ref 6–20)
CALCIUM: 8.9 mg/dL (ref 8.9–10.3)
CHLORIDE: 107 mmol/L (ref 101–111)
CO2: 25 mmol/L (ref 22–32)
Creatinine, Ser: 5.87 mg/dL — ABNORMAL HIGH (ref 0.61–1.24)
GFR calc non Af Amer: 9 mL/min — ABNORMAL LOW (ref >60)
GFR, EST AFRICAN AMERICAN: 10 mL/min — AB (ref >60)
Glucose, Bld: 102 mg/dL — ABNORMAL HIGH (ref 65–99)
Potassium: 4.6 mmol/L (ref 3.5–5.1)
SODIUM: 144 mmol/L (ref 135–145)

## 2017-11-26 LAB — PROTIME-INR
INR: 0.91
PROTHROMBIN TIME: 12.1 s (ref 11.4–15.2)

## 2017-11-26 LAB — CBC
HCT: 33.6 % — ABNORMAL LOW (ref 39.0–52.0)
Hemoglobin: 10.5 g/dL — ABNORMAL LOW (ref 13.0–17.0)
MCH: 25.3 pg — ABNORMAL LOW (ref 26.0–34.0)
MCHC: 31.3 g/dL (ref 30.0–36.0)
MCV: 81 fL (ref 78.0–100.0)
PLATELETS: 183 10*3/uL (ref 150–400)
RBC: 4.15 MIL/uL — AB (ref 4.22–5.81)
RDW: 14.7 % (ref 11.5–15.5)
WBC: 7.4 10*3/uL (ref 4.0–10.5)

## 2017-11-26 NOTE — Progress Notes (Signed)
Dr Lissa Hoard ( anesthesia) made aware that patient admitted to cocaine use on 11/24/2017 and marijuana use about 2 weeks. Ago.  Patient states he does not want wife to know.  Dr Lissa Hoard stated to do urine drug screen am of surgery and make sure Dr Gloriann Loan aware.  Urine drug screen ordered am of surgery of Pam Noberto Retort ( scheduler) at [facility] made aware of above.

## 2017-11-26 NOTE — Progress Notes (Signed)
BMP and CBC done 11/26/17 faxed via epic to Dr Link Snuffer.

## 2017-11-26 NOTE — Patient Instructions (Signed)
QUINLIN CONANT  11/26/2017   Your procedure is scheduled on: 12/24/2017   Report to [facility] Main  Entrance  Report to admitting at   0530 AM    Call this number if you have problems the morning of surgery [phone]   Remember: Do not eat food or drink liquids :After Midnight.     Take these medicines the morning of surgery with A SIP OF WATER: Allopurinol, Amlodipine, Use Inhalers as usual and bring, Hydralazine, Hydrocodone if needed, Nasal spray, Take 1/2 of evening  Dose of Lantus Insulin nite before surgery.   DO NOT TAKE ANY DIABETIC MEDICATIONS DAY OF YOUR SURGERY                               You may not have any metal on your body including hair pins and              piercings  Do not wear jewelry, , lotions, powders or perfumes, deodorant           .              Men may shave face and neck.   Do not bring valuables to the hospital. [place].  Contacts, dentures or bridgework may not be worn into surgery.       Patients discharged the day of surgery will not be allowed to drive home.  Name and phone number of your driver: wife                 Please read over the following fact sheets you were given: _____________________________________________________________________             [facility] - Preparing for Surgery Before surgery, you can play an important role.  Because skin is not sterile, your skin needs to be as free of germs as possible.  You can reduce the number of germs on your skin by washing with CHG (chlorahexidine gluconate) soap before surgery.  CHG is an antiseptic cleaner which kills germs and bonds with the skin to continue killing germs even after washing. Please DO NOT use if you have an allergy to CHG or antibacterial soaps.  If your skin becomes reddened/irritated stop using the CHG and inform your nurse when you arrive at Short Stay. Do not shave (including legs and  underarms) for at least 48 hours prior to the first CHG shower.  You may shave your face/neck. Please follow these instructions carefully:  1.  Shower with CHG Soap the night before surgery and the  morning of Surgery.  2.  If you choose to wash your hair, wash your hair first as usual with your  normal  shampoo.  3.  After you shampoo, rinse your hair and body thoroughly to remove the  shampoo.                           4.  Use CHG as you would any other liquid soap.  You can apply chg directly  to the skin and wash                       Gently with a scrungie  or clean washcloth.  5.  Apply the CHG Soap to your body ONLY FROM THE NECK DOWN.   Do not use on face/ open                           Wound or open sores. Avoid contact with eyes, ears mouth and genitals (private parts).                       Wash face,  Genitals (private parts) with your normal soap.             6.  Wash thoroughly, paying special attention to the area where your surgery  will be performed.  7.  Thoroughly rinse your body with warm water from the neck down.  8.  DO NOT shower/wash with your normal soap after using and rinsing off  the CHG Soap.                9.  Pat yourself dry with a clean towel.            10.  Wear clean pajamas.            11.  Place clean sheets on your bed the night of your first shower and do not  sleep with pets. Day of Surgery : Do not apply any lotions/deodorants the morning of surgery.  Please wear clean clothes to the hospital/surgery center.  FAILURE TO FOLLOW THESE INSTRUCTIONS MAY RESULT IN THE CANCELLATION OF YOUR SURGERY PATIENT SIGNATURE_________________________________  NURSE SIGNATURE__________________________________  ________________________________________________________________________

## 2017-11-26 NOTE — Progress Notes (Signed)
EKG-11/13/17-epic  AIC-10/18/17-epic  9.25.18-ct chest-epic  Clearance- Dr Velvet Bathe- on chart- 11/06/2017 Dr Debara Pickett- 11/13/17-LOV note - epic  ECHO-09/26/16-epic

## 2017-11-27 ENCOUNTER — Other Ambulatory Visit: Payer: Self-pay | Admitting: Vascular Surgery

## 2017-11-27 DIAGNOSIS — B351 Tinea unguium: Secondary | ICD-10-CM | POA: Diagnosis not present

## 2017-11-27 DIAGNOSIS — E1142 Type 2 diabetes mellitus with diabetic polyneuropathy: Secondary | ICD-10-CM | POA: Diagnosis not present

## 2017-11-27 DIAGNOSIS — L851 Acquired keratosis [keratoderma] palmaris et plantaris: Secondary | ICD-10-CM | POA: Diagnosis not present

## 2017-12-09 ENCOUNTER — Encounter (HOSPITAL_COMMUNITY): Payer: Self-pay

## 2017-12-09 ENCOUNTER — Emergency Department (HOSPITAL_COMMUNITY)
Admission: EM | Admit: 2017-12-09 | Discharge: 2017-12-09 | Disposition: A | Discharge status: Home / Self Care | Payer: Medicare Other | Attending: Emergency Medicine | Admitting: Emergency Medicine

## 2017-12-09 ENCOUNTER — Other Ambulatory Visit: Payer: Self-pay

## 2017-12-09 DIAGNOSIS — Z7982 Long term (current) use of aspirin: Secondary | ICD-10-CM | POA: Insufficient documentation

## 2017-12-09 DIAGNOSIS — E119 Type 2 diabetes mellitus without complications: Secondary | ICD-10-CM | POA: Diagnosis not present

## 2017-12-09 DIAGNOSIS — M109 Gout, unspecified: Secondary | ICD-10-CM | POA: Diagnosis not present

## 2017-12-09 DIAGNOSIS — I2510 Atherosclerotic heart disease of native coronary artery without angina pectoris: Secondary | ICD-10-CM | POA: Diagnosis not present

## 2017-12-09 DIAGNOSIS — Z794 Long term (current) use of insulin: Secondary | ICD-10-CM | POA: Insufficient documentation

## 2017-12-09 DIAGNOSIS — Z87891 Personal history of nicotine dependence: Secondary | ICD-10-CM | POA: Diagnosis not present

## 2017-12-09 DIAGNOSIS — M1A0720 Idiopathic chronic gout, left ankle and foot, without tophus (tophi): Secondary | ICD-10-CM | POA: Insufficient documentation

## 2017-12-09 DIAGNOSIS — M25572 Pain in left ankle and joints of left foot: Secondary | ICD-10-CM | POA: Diagnosis present

## 2017-12-09 DIAGNOSIS — J449 Chronic obstructive pulmonary disease, unspecified: Secondary | ICD-10-CM | POA: Insufficient documentation

## 2017-12-09 DIAGNOSIS — N184 Chronic kidney disease, stage 4 (severe): Secondary | ICD-10-CM | POA: Diagnosis not present

## 2017-12-09 DIAGNOSIS — I1310 Hypertensive heart and chronic kidney disease without heart failure, with stage 1 through stage 4 chronic kidney disease, or unspecified chronic kidney disease: Secondary | ICD-10-CM | POA: Insufficient documentation

## 2017-12-09 LAB — CBG MONITORING, ED: GLUCOSE-CAPILLARY: 107 mg/dL — AB (ref 65–99)

## 2017-12-09 MED ORDER — COLCHICINE 0.6 MG PO TABS: 0.3000 mg | ORAL_TABLET | Freq: Every day | ORAL | 0 refills | Status: DC

## 2017-12-09 MED ORDER — PREDNISONE 20 MG PO TABS: ORAL_TABLET | ORAL | 0 refills | Status: DC

## 2017-12-09 MED ORDER — DEXAMETHASONE SODIUM PHOSPHATE 10 MG/ML IJ SOLN
10.0000 mg | Freq: Once | INTRAMUSCULAR | Status: AC
  Administered 2017-12-09: 10 mg via INTRAMUSCULAR
  Filled 2017-12-09: qty 1

## 2017-12-09 MED ORDER — COLCHICINE 0.6 MG PO TABS
0.6000 mg | ORAL_TABLET | Freq: Once | ORAL | Status: AC
  Administered 2017-12-09: 0.6 mg via ORAL
  Filled 2017-12-09: qty 1

## 2017-12-09 NOTE — Discharge Instructions (Addendum)
Elevate your foot. Use heat for comfort. Take the medications as prescribed. Follow up with your doctor if not improving over the next several days. Look at the low purine diet and try to avoid things that make gout worse.

## 2017-12-09 NOTE — ED Notes (Addendum)
Pt at doorway with crutches .   Asking for discharge papers.  Explained to pt that we were waiting on  discharge  papers.

## 2017-12-09 NOTE — ED Triage Notes (Signed)
Pt states food pain began Friday May 24th, pain got progressively worse. Hx of gout.

## 2017-12-09 NOTE — ED Notes (Signed)
Pt walking out without discharge papers.

## 2017-12-09 NOTE — ED Provider Notes (Signed)
[facility] EMERGENCY DEPARTMENT Provider Note   CSN: [id number] Arrival date & time: 12/09/17  0249  Time seen 05:40 AM  History   Chief Complaint Chief Complaint  Patient presents with  . Foot Pain    HPI [name] [name] is a 67 y.o. male.  HPI patient states May 24 if he started having pain in his left foot and his left ankle.  He states he has had it before with gout.  He states he is taking his allopurinol although he states "it does not work".  He denies any fever.  He denies any  injury.  PCP [name], MD   Past Medical History:  Diagnosis Date  . Anemia   . Arthritis   . Bell palsy   . CAD (coronary artery disease)    a. 2014 MV: abnl w/ infap ischemia; b. 03/2013 Cath: aneurysmal bleb in the LAD w/ otw nonobs dzs-->Med Rx.  . Chronic back pain   . Chronic knee pain    a. 09/2015 s/p R TKA.  Marland Kitchen Chronic pain   . Chronic shoulder pain   . Chronic sinusitis   . CKD (chronic kidney disease), stage IV ([place])    stage 4 per office visit note of Dr Lowanda Foster on 05/2015   . COPD (chronic obstructive pulmonary disease) ([place])   . Diabetes mellitus without complication ([place])    type II   . Essential hypertension   . GERD (gastroesophageal reflux disease)   . Gout   . Hepatomegaly    noted on noncontrast CT 2015  . History of hiatal hernia   . Hyperlipidemia   . Lateral meniscus tear   . Obesity    [place]  . Obstructive sleep apnea    does not use cpap   . On home oxygen therapy    uses 2l when is going somewhere per patient   . PUD (peptic ulcer disease)    remote, reports f/u EGD about 8 years ago unremarkable   . Reactive airway disease    related to exposure to chemical during 9/11  . Sinusitis   . Vitamin D deficiency     Patient Active Problem List   Diagnosis Date Noted  . End stage renal disease ([place]) 11/13/2017  . Foul smelling urine 12/14/2016  . Coronary artery disease involving native coronary artery of native heart without angina pectoris  10/27/2016  . Chronic kidney disease, stage IV (severe) ([place]) 10/27/2016  . Morbid obesity due to excess calories ([place]) 03/22/2016  . CAD (coronary artery disease)   . Erectile dysfunction 08/16/2015  . Hemorrhoids 07/05/2015  . Vitamin D deficiency 06/04/2015  . OSA on CPAP 05/14/2015  . Normocytic anemia 03/24/2015  . Fatty liver 12/01/2014  . Back pain 06/05/2013  . OA (osteoarthritis) of knee 03/15/2011  . CTS (carpal tunnel syndrome) 03/15/2011  . Type 2 diabetes mellitus with stage 4 chronic kidney disease, with long-term current use of insulin ([place]) 09/29/2008  . Mixed hyperlipidemia 09/29/2008  . Essential hypertension 09/29/2008  . GERD 09/29/2008    Past Surgical History:  Procedure Laterality Date  . ASAD LT SHOULDER  12/2008   left shoulder  . AV FISTULA PLACEMENT Left 08/09/2016   Procedure: BRACHIOCEPHALIC ARTERIOVENOUS (AV) FISTULA CREATION LEFT ARM;  Surgeon: [name], MD;  Location: [place];  Service: Vascular;  Laterality: Left;  . CATARACT EXTRACTION W/PHACO Left 03/28/2016   Procedure: CATARACT EXTRACTION PHACO AND INTRAOCULAR LENS PLACEMENT LEFT EYE;  Surgeon: [name], MD;  Location: [facility];  Service: Ophthalmology;  Laterality: Left;  CDE: 4.77  . CATARACT EXTRACTION W/PHACO Right 04/11/2016   Procedure: CATARACT EXTRACTION PHACO AND INTRAOCULAR LENS PLACEMENT RIGHT EYE; CDE:  4.74;  Surgeon: [name], MD;  Location: [facility];  Service: Ophthalmology;  Laterality: Right;  . COLONOSCOPY  10/2008   Fields: Rectal polyp obliterated, not retrieved, hemorrhoids, single ascending colon diverticulum near the CV. Next colonoscopy April 2020  . COLONOSCOPY N/A 12/25/2014   SLF: 1. Colorectal polyps (2) removed 2. Small internal hemorrhoids 3. the left colon is severely redundant  . DOPPLER ECHOCARDIOGRAPHY    . ESOPHAGOGASTRODUODENOSCOPY N/A 12/25/2014   SLF: 1. Anemia most likely due to CRI, gastritis, gastric polyps 2. Moderate non-erosive gastriits and mild  duodenitis.  3.TWo large gstric polyps removed.   Marland Kitchen EYE SURGERY  12/22/2010   tear duct probing-[place]  . FOREIGN BODY REMOVAL  03/29/2011   Procedure: REMOVAL FOREIGN BODY EXTREMITY;  Surgeon: Arther Abbott, MD;  Location: [facility];  Service: Orthopedics;  Laterality: Right;  Removal Foreign Body Right Thumb  . KNEE ARTHROSCOPY  10/2007   left  . KNEE ARTHROSCOPY WITH LATERAL MENISECTOMY Right 10/14/2015   Procedure: LEFT KNEE ARTHROSCOPY WITH PARTIAL LATERAL MENISECTOMY;  Surgeon: Carole Civil, MD;  Location: [facility];  Service: Orthopedics;  Laterality: Right;  . LEFT HEART CATHETERIZATION WITH CORONARY ANGIOGRAM N/A 03/28/2013   Procedure: LEFT HEART CATHETERIZATION WITH CORONARY ANGIOGRAM;  Surgeon: Leonie Man, MD;  Location: [facility] CATH LAB;  Service: Cardiovascular;  Laterality: N/A;  . NM MYOVIEW LTD    . PENILE PROSTHESIS IMPLANT N/A 08/16/2015   Procedure: PENILE PROTHESIS INFLATABLE, three piece, Excisional biopsy of Penile ulcer, Penile molding;  Surgeon: Carolan Clines, MD;  Location: [facility];  Service: Urology;  Laterality: N/A;  . QUADRICEPS TENDON REPAIR  07/21/2011   Procedure: REPAIR QUADRICEP TENDON;  Surgeon: Arther Abbott, MD;  Location: [facility];  Service: Orthopedics;  Laterality: Right;  . TOENAIL EXCISION     removed [id number]  . UMBILICAL HERNIA REPAIR  2007   [place]        Home Medications    Prior to Admission medications   Medication Sig Start Date End Date Taking? Authorizing Provider  acetaminophen (TYLENOL) 650 MG CR tablet Take 1,300 mg by mouth daily as needed for pain.    [provider]  allopurinol (ZYLOPRIM) 100 MG tablet Take 100 mg by mouth daily.    [provider]  amLODipine (NORVASC) 10 MG tablet Take 10 mg by mouth daily.     [provider]  aspirin EC 81 MG tablet Take 1 tablet (81 mg total) by mouth daily. 05/01/16   Hilty, Nadean Corwin, MD  budesonide-formoterol (SYMBICORT) 160-4.5 MCG/ACT inhaler Inhale  2 puffs into the lungs 2 (two) times daily as needed (Shortness of Breath).     [provider]  calcitRIOL (ROCALTROL) 0.25 MCG capsule Take 0.25 mcg by mouth daily.    [provider]  cetirizine (ZYRTEC) 10 MG tablet Take 10 mg by mouth at bedtime as needed for allergies.     [provider]  colchicine 0.6 MG tablet Take 0.5 tablets (0.3 mg total) by mouth daily. 12/09/17   Rolland Porter, MD  diclofenac sodium (VOLTAREN) 1 % GEL Apply 1 application topically 2 (two) times daily as needed (muscle pain).    [provider]  hydrALAZINE (APRESOLINE) 25 MG tablet Take 25 mg by mouth 3 (three) times daily. 06/29/16   [provider]  HYDROcodone-acetaminophen (NORCO/VICODIN) 5-325 MG tablet Take 1 tablet by mouth every 4 (four) hours as needed. 11/22/17   Evalee Jefferson, PA-C  Insulin Glargine (LANTUS SOLOSTAR) 100 UNIT/ML Solostar Pen INJECT 30 UNITS SUBCUTANEOUSLY AT BEDTIME. 11/27/17   Angelia Mould, MD  Insulin Pen Needle (PEN NEEDLES 3/16") 31G X 5 MM MISC Use as directed qhs 02/05/17   Nida, Marella Chimes, MD  mometasone (NASONEX) 50 MCG/ACT nasal spray Place 2 sprays into the nose 2 (two) times daily as needed (allergies).     [provider]  pantoprazole (PROTONIX) 40 MG tablet TAKE 1 TABLET TWICE DAILY 30 MINUTES PRIOR TO MEALS Patient taking differently: Take 40 mg by mouth once daily 02/26/17   Annitta Needs, NP  predniSONE (DELTASONE) 20 MG tablet Take 2 po QD x 4d then 1 po QD x 4d 12/09/17   Rolland Porter, MD  simvastatin (ZOCOR) 20 MG tablet Take 20 mg by mouth every morning.     [provider]  TRADJENTA 5 MG TABS tablet TAKE 1 TABLET EVERY DAY 10/18/17   Cassandria Anger, MD    Family History Family History  Problem Relation Age of Onset  . Hypertension Mother        MI  . Cancer Mother        breast   . Diabetes Mother   . Diabetes Father   . Hypertension Father   . Hypertension Sister   . Diabetes Sister   .  Arthritis Unknown   . Asthma Unknown   . Lung disease Unknown   . Anesthesia problems Neg Hx   . Hypotension Neg Hx   . Malignant hyperthermia Neg Hx   . Pseudochol deficiency Neg Hx   . Colon cancer Neg Hx     Social History Social History   Tobacco Use  . Smoking status: Former Smoker    Packs/day: 1.00    Years: 25.00    Pack years: 25.00    Types: Cigarettes    Last attempt to quit: 03/27/2010    Years since quitting: 7.7  . Smokeless tobacco: Never Used  . Tobacco comment: Quit x 7 years  Substance Use Topics  . Alcohol use: Yes    Alcohol/week: 0.0 oz    Comment: occasionally  . Drug use: Yes    Types: Marijuana    Comment: cocaine- last time used- 11/24/2017 , marijuana- last use- 11/14/2017      Allergies   Opana [oxymorphone hcl] and Tramadol   Review of Systems Review of Systems  All other systems reviewed and are negative.    Physical Exam Updated Vital Signs BP (!) 164/101   Pulse 89   Temp 98.6 F (37 C) (Oral)   Resp 16   Ht 5\' 4"  (1.626 m)   Wt 107.5 kg (237 lb)   SpO2 95%   BMI 40.68 kg/m   Vital signs normal except for hypertension   Physical Exam  Constitutional: He appears well-developed and well-nourished. He appears distressed.  HENT:  Head: Normocephalic and atraumatic.  Right Ear: External ear normal.  Left Ear: External ear normal.  Nose: Nose normal.  Eyes: Conjunctivae and EOM are normal.  Neck: Normal range of motion.  Cardiovascular: Normal rate.  Pulmonary/Chest: Effort normal. No respiratory distress.  Musculoskeletal: He exhibits edema and tenderness.  Patient has mild diffuse swelling of the dorsum of his foot and his ankle, the skin is slightly warm to touch and he is extremely tender to even  light touch.  Nursing note and vitals reviewed.    ED Treatments / Results  Labs (all labs ordered are listed, but only abnormal results are displayed) Labs Reviewed  CBG MONITORING, ED - Abnormal; Notable for the  following components:      Result Value   Glucose-Capillary 107 (*)    All other components within normal limits    EKG None  Radiology No results found.  Procedures Procedures (including critical care time)  Medications Ordered in ED Medications  dexamethasone (DECADRON) injection 10 mg (10 mg Intramuscular Given 12/09/17 0557)  colchicine tablet 0.6 mg (0.6 mg Oral Given 12/09/17 0557)     Initial Impression / Assessment and Plan / ED Course  I have reviewed the triage vital signs and the nursing notes.  Pertinent labs & imaging results that were available during my care of the patient were reviewed by me and considered in my medical decision making (see chart for details).     Patient had blood work on March 26 showing uric acid 8.8.  His last BUN and creatinine was May 13 and were 75 and 5.87.  Therefore patient cannot get nonsteroidal anti-inflammatory drugs or it will worsen his pre-existing renal disease.  CBG was done which was pretty normal at 107, he was given Decadron IM and started on colchicine.  Final Clinical Impressions(s) / ED Diagnoses   Final diagnoses:  Acute gout of left ankle, unspecified cause  Acute gout of left foot, unspecified cause    ED Discharge Orders        Ordered    colchicine 0.6 MG tablet  Daily     12/09/17 0632    predniSONE (DELTASONE) 20 MG tablet     12/09/17 7473      Plan discharge  Rolland Porter, MD, Barbette Or, MD 12/09/17 [phone]

## 2017-12-09 NOTE — ED Notes (Signed)
Pt was walking down the hallway and leaving without instructions.  Caught pt and gave instructions with verbal acknowledgment of understanding.  Assisted to vehicle via WC.

## 2017-12-09 NOTE — ED Notes (Signed)
Rounded on pt, pt urinated in room trash can. Gave urinal and reiterated instructions on the use of call bell.

## 2017-12-13 DIAGNOSIS — E1129 Type 2 diabetes mellitus with other diabetic kidney complication: Secondary | ICD-10-CM | POA: Diagnosis not present

## 2017-12-13 DIAGNOSIS — N184 Chronic kidney disease, stage 4 (severe): Secondary | ICD-10-CM | POA: Diagnosis not present

## 2017-12-13 DIAGNOSIS — D519 Vitamin B12 deficiency anemia, unspecified: Secondary | ICD-10-CM | POA: Diagnosis not present

## 2017-12-13 DIAGNOSIS — R809 Proteinuria, unspecified: Secondary | ICD-10-CM | POA: Diagnosis not present

## 2017-12-13 DIAGNOSIS — D509 Iron deficiency anemia, unspecified: Secondary | ICD-10-CM | POA: Diagnosis not present

## 2017-12-13 DIAGNOSIS — I10 Essential (primary) hypertension: Secondary | ICD-10-CM | POA: Diagnosis not present

## 2017-12-21 DIAGNOSIS — R809 Proteinuria, unspecified: Secondary | ICD-10-CM | POA: Diagnosis not present

## 2017-12-21 DIAGNOSIS — D509 Iron deficiency anemia, unspecified: Secondary | ICD-10-CM | POA: Diagnosis not present

## 2017-12-21 DIAGNOSIS — M109 Gout, unspecified: Secondary | ICD-10-CM | POA: Diagnosis not present

## 2017-12-21 DIAGNOSIS — I10 Essential (primary) hypertension: Secondary | ICD-10-CM | POA: Diagnosis not present

## 2017-12-21 DIAGNOSIS — E559 Vitamin D deficiency, unspecified: Secondary | ICD-10-CM | POA: Diagnosis not present

## 2017-12-21 DIAGNOSIS — Z79899 Other long term (current) drug therapy: Secondary | ICD-10-CM | POA: Diagnosis not present

## 2017-12-23 MED ORDER — GENTAMICIN SULFATE 40 MG/ML IJ SOLN
1.5000 mg/kg | INTRAVENOUS | Status: AC
  Administered 2017-12-24: 120 mg via INTRAVENOUS
  Filled 2017-12-23: qty 3

## 2017-12-24 ENCOUNTER — Encounter (HOSPITAL_COMMUNITY): Payer: Self-pay | Admitting: *Deleted

## 2017-12-24 ENCOUNTER — Ambulatory Visit (HOSPITAL_COMMUNITY)
Admission: RE | Admit: 2017-12-24 | Discharge: 2017-12-24 | Disposition: A | Discharge status: Home / Self Care | Payer: Medicare Other | Source: Ambulatory Visit | Attending: Urology | Admitting: Urology

## 2017-12-24 ENCOUNTER — Ambulatory Visit (HOSPITAL_COMMUNITY): Payer: Medicare Other | Admitting: Certified Registered"

## 2017-12-24 ENCOUNTER — Encounter (HOSPITAL_COMMUNITY)
Admission: RE | Disposition: A | Discharge status: Home / Self Care | Payer: Self-pay | Source: Ambulatory Visit | Attending: Urology

## 2017-12-24 DIAGNOSIS — G510 Bell's palsy: Secondary | ICD-10-CM | POA: Insufficient documentation

## 2017-12-24 DIAGNOSIS — J449 Chronic obstructive pulmonary disease, unspecified: Secondary | ICD-10-CM | POA: Insufficient documentation

## 2017-12-24 DIAGNOSIS — Z8249 Family history of ischemic heart disease and other diseases of the circulatory system: Secondary | ICD-10-CM | POA: Insufficient documentation

## 2017-12-24 DIAGNOSIS — T83490A Other mechanical complication of penile (implanted) prosthesis, initial encounter: Secondary | ICD-10-CM | POA: Insufficient documentation

## 2017-12-24 DIAGNOSIS — I2510 Atherosclerotic heart disease of native coronary artery without angina pectoris: Secondary | ICD-10-CM | POA: Insufficient documentation

## 2017-12-24 DIAGNOSIS — D649 Anemia, unspecified: Secondary | ICD-10-CM | POA: Diagnosis not present

## 2017-12-24 DIAGNOSIS — N184 Chronic kidney disease, stage 4 (severe): Secondary | ICD-10-CM | POA: Insufficient documentation

## 2017-12-24 DIAGNOSIS — Z8711 Personal history of peptic ulcer disease: Secondary | ICD-10-CM | POA: Insufficient documentation

## 2017-12-24 DIAGNOSIS — T83410A Breakdown (mechanical) of penile (implanted) prosthesis, initial encounter: Secondary | ICD-10-CM | POA: Diagnosis not present

## 2017-12-24 DIAGNOSIS — K219 Gastro-esophageal reflux disease without esophagitis: Secondary | ICD-10-CM | POA: Insufficient documentation

## 2017-12-24 DIAGNOSIS — M25519 Pain in unspecified shoulder: Secondary | ICD-10-CM | POA: Diagnosis not present

## 2017-12-24 DIAGNOSIS — E1122 Type 2 diabetes mellitus with diabetic chronic kidney disease: Secondary | ICD-10-CM | POA: Insufficient documentation

## 2017-12-24 DIAGNOSIS — Z833 Family history of diabetes mellitus: Secondary | ICD-10-CM | POA: Insufficient documentation

## 2017-12-24 DIAGNOSIS — G4733 Obstructive sleep apnea (adult) (pediatric): Secondary | ICD-10-CM | POA: Insufficient documentation

## 2017-12-24 DIAGNOSIS — R160 Hepatomegaly, not elsewhere classified: Secondary | ICD-10-CM | POA: Insufficient documentation

## 2017-12-24 DIAGNOSIS — X58XXXA Exposure to other specified factors, initial encounter: Secondary | ICD-10-CM | POA: Insufficient documentation

## 2017-12-24 DIAGNOSIS — Z9981 Dependence on supplemental oxygen: Secondary | ICD-10-CM | POA: Diagnosis not present

## 2017-12-24 DIAGNOSIS — N486 Induration penis plastica: Secondary | ICD-10-CM | POA: Insufficient documentation

## 2017-12-24 DIAGNOSIS — Z9841 Cataract extraction status, right eye: Secondary | ICD-10-CM | POA: Insufficient documentation

## 2017-12-24 DIAGNOSIS — I129 Hypertensive chronic kidney disease with stage 1 through stage 4 chronic kidney disease, or unspecified chronic kidney disease: Secondary | ICD-10-CM | POA: Diagnosis not present

## 2017-12-24 DIAGNOSIS — M549 Dorsalgia, unspecified: Secondary | ICD-10-CM | POA: Diagnosis not present

## 2017-12-24 DIAGNOSIS — G8929 Other chronic pain: Secondary | ICD-10-CM | POA: Diagnosis not present

## 2017-12-24 DIAGNOSIS — K449 Diaphragmatic hernia without obstruction or gangrene: Secondary | ICD-10-CM | POA: Diagnosis not present

## 2017-12-24 DIAGNOSIS — Z888 Allergy status to other drugs, medicaments and biological substances status: Secondary | ICD-10-CM | POA: Insufficient documentation

## 2017-12-24 DIAGNOSIS — M109 Gout, unspecified: Secondary | ICD-10-CM | POA: Diagnosis not present

## 2017-12-24 DIAGNOSIS — J329 Chronic sinusitis, unspecified: Secondary | ICD-10-CM | POA: Insufficient documentation

## 2017-12-24 DIAGNOSIS — E669 Obesity, unspecified: Secondary | ICD-10-CM | POA: Diagnosis not present

## 2017-12-24 DIAGNOSIS — Z803 Family history of malignant neoplasm of breast: Secondary | ICD-10-CM | POA: Insufficient documentation

## 2017-12-24 DIAGNOSIS — Z87891 Personal history of nicotine dependence: Secondary | ICD-10-CM | POA: Insufficient documentation

## 2017-12-24 DIAGNOSIS — M25569 Pain in unspecified knee: Secondary | ICD-10-CM | POA: Diagnosis not present

## 2017-12-24 DIAGNOSIS — Z9842 Cataract extraction status, left eye: Secondary | ICD-10-CM | POA: Insufficient documentation

## 2017-12-24 DIAGNOSIS — E785 Hyperlipidemia, unspecified: Secondary | ICD-10-CM | POA: Insufficient documentation

## 2017-12-24 DIAGNOSIS — Z794 Long term (current) use of insulin: Secondary | ICD-10-CM | POA: Insufficient documentation

## 2017-12-24 DIAGNOSIS — Z825 Family history of asthma and other chronic lower respiratory diseases: Secondary | ICD-10-CM | POA: Insufficient documentation

## 2017-12-24 DIAGNOSIS — E559 Vitamin D deficiency, unspecified: Secondary | ICD-10-CM | POA: Insufficient documentation

## 2017-12-24 DIAGNOSIS — M1990 Unspecified osteoarthritis, unspecified site: Secondary | ICD-10-CM | POA: Insufficient documentation

## 2017-12-24 DIAGNOSIS — Z79899 Other long term (current) drug therapy: Secondary | ICD-10-CM | POA: Insufficient documentation

## 2017-12-24 HISTORY — PX: PENILE PROSTHESIS IMPLANT: SHX240

## 2017-12-24 LAB — POCT I-STAT 4, (NA,K, GLUC, HGB,HCT)
GLUCOSE: 336 mg/dL — AB (ref 65–99)
HEMATOCRIT: 28 % — AB (ref 39.0–52.0)
Hemoglobin: 9.5 g/dL — ABNORMAL LOW (ref 13.0–17.0)
Potassium: 3.9 mmol/L (ref 3.5–5.1)
SODIUM: 138 mmol/L (ref 135–145)

## 2017-12-24 LAB — RAPID URINE DRUG SCREEN, HOSP PERFORMED
Amphetamines: NOT DETECTED
Barbiturates: NOT DETECTED
Benzodiazepines: NOT DETECTED
COCAINE: NOT DETECTED
OPIATES: NOT DETECTED
Tetrahydrocannabinol: NOT DETECTED

## 2017-12-24 LAB — GLUCOSE, CAPILLARY
Glucose-Capillary: 103 mg/dL — ABNORMAL HIGH (ref 65–99)
Glucose-Capillary: 155 mg/dL — ABNORMAL HIGH (ref 65–99)

## 2017-12-24 SURGERY — INSERTION, PENILE PROSTHESIS, INFLATABLE
Anesthesia: General

## 2017-12-24 MED ORDER — 0.9 % SODIUM CHLORIDE (POUR BTL) OPTIME
TOPICAL | Status: DC | PRN
  Administered 2017-12-24: 1000 mL

## 2017-12-24 MED ORDER — SULFAMETHOXAZOLE-TRIMETHOPRIM 800-160 MG PO TABS: 1.0000 | ORAL_TABLET | Freq: Two times a day (BID) | ORAL | 0 refills | Status: AC

## 2017-12-24 MED ORDER — PROPOFOL 10 MG/ML IV BOLUS
INTRAVENOUS | Status: DC | PRN
  Administered 2017-12-24: 200 mg via INTRAVENOUS
  Administered 2017-12-24: 100 mg via INTRAVENOUS

## 2017-12-24 MED ORDER — HYDROGEN PEROXIDE 3 % EX SOLN
CUTANEOUS | Status: DC | PRN
  Administered 2017-12-24: 1

## 2017-12-24 MED ORDER — FENTANYL CITRATE (PF) 250 MCG/5ML IJ SOLN
INTRAMUSCULAR | Status: DC | PRN
  Administered 2017-12-24: 50 ug via INTRAVENOUS
  Administered 2017-12-24: 100 ug via INTRAVENOUS
  Administered 2017-12-24 (×2): 50 ug via INTRAVENOUS

## 2017-12-24 MED ORDER — SODIUM CHLORIDE 0.9 % IV SOLN
INTRAVENOUS | Status: AC
  Filled 2017-12-24: qty 500000

## 2017-12-24 MED ORDER — CISATRACURIUM BESYLATE 20 MG/10ML IV SOLN
INTRAVENOUS | Status: AC
  Filled 2017-12-24: qty 10

## 2017-12-24 MED ORDER — HYDROCODONE-ACETAMINOPHEN 5-325 MG PO TABS: 1.0000 | ORAL_TABLET | ORAL | 0 refills | Status: DC | PRN

## 2017-12-24 MED ORDER — LIDOCAINE 2% (20 MG/ML) 5 ML SYRINGE
INTRAMUSCULAR | Status: DC | PRN
  Administered 2017-12-24: 100 mg via INTRAVENOUS

## 2017-12-24 MED ORDER — HYDROGEN PEROXIDE 3 % EX SOLN
CUTANEOUS | Status: AC
  Filled 2017-12-24: qty 473

## 2017-12-24 MED ORDER — BUPIVACAINE-EPINEPHRINE (PF) 0.5% -1:200000 IJ SOLN: INTRAMUSCULAR | Status: DC | PRN

## 2017-12-24 MED ORDER — MIDAZOLAM HCL 5 MG/5ML IJ SOLN
INTRAMUSCULAR | Status: DC | PRN
  Administered 2017-12-24: 2 mg via INTRAVENOUS

## 2017-12-24 MED ORDER — BACITRACIN ZINC 500 UNIT/GM EX OINT
TOPICAL_OINTMENT | CUTANEOUS | Status: AC
  Filled 2017-12-24: qty 28.35

## 2017-12-24 MED ORDER — LACTATED RINGERS IV SOLN
INTRAVENOUS | Status: DC
  Administered 2017-12-24 (×2): via INTRAVENOUS

## 2017-12-24 MED ORDER — DEXMEDETOMIDINE HCL IN NACL 200 MCG/50ML IV SOLN
INTRAVENOUS | Status: AC
  Filled 2017-12-24: qty 50

## 2017-12-24 MED ORDER — CISATRACURIUM BESYLATE (PF) 10 MG/5ML IV SOLN
INTRAVENOUS | Status: DC | PRN
  Administered 2017-12-24 (×2): 2 mg via INTRAVENOUS
  Administered 2017-12-24 (×2): 8 mg via INTRAVENOUS

## 2017-12-24 MED ORDER — NEOSTIGMINE METHYLSULFATE 10 MG/10ML IV SOLN
INTRAVENOUS | Status: DC | PRN
  Administered 2017-12-24: 3 mg via INTRAVENOUS

## 2017-12-24 MED ORDER — DEXAMETHASONE SODIUM PHOSPHATE 10 MG/ML IJ SOLN
INTRAMUSCULAR | Status: DC | PRN
  Administered 2017-12-24: 10 mg via INTRAVENOUS

## 2017-12-24 MED ORDER — FENTANYL CITRATE (PF) 250 MCG/5ML IJ SOLN
INTRAMUSCULAR | Status: AC
  Filled 2017-12-24: qty 5

## 2017-12-24 MED ORDER — EPHEDRINE SULFATE 50 MG/ML IJ SOLN
INTRAMUSCULAR | Status: DC | PRN
Start: 2017-12-24 — End: 2017-12-24
  Administered 2017-12-24: 5 mg via INTRAVENOUS

## 2017-12-24 MED ORDER — VANCOMYCIN HCL IN DEXTROSE 1-5 GM/200ML-% IV SOLN
1000.0000 mg | INTRAVENOUS | Status: AC
  Administered 2017-12-24: 1000 mg via INTRAVENOUS
  Filled 2017-12-24: qty 200

## 2017-12-24 MED ORDER — GLYCOPYRROLATE 0.2 MG/ML IJ SOLN
INTRAMUSCULAR | Status: DC | PRN
  Administered 2017-12-24: .6 mg via INTRAVENOUS

## 2017-12-24 MED ORDER — BACITRACIN 500 UNIT/GM EX OINT: TOPICAL_OINTMENT | CUTANEOUS | Status: DC | PRN

## 2017-12-24 MED ORDER — PROPOFOL 10 MG/ML IV BOLUS
INTRAVENOUS | Status: AC
  Filled 2017-12-24: qty 40

## 2017-12-24 MED ORDER — FENTANYL CITRATE (PF) 100 MCG/2ML IJ SOLN
25.0000 ug | INTRAMUSCULAR | Status: DC | PRN
  Administered 2017-12-24: 50 ug via INTRAVENOUS

## 2017-12-24 MED ORDER — DEXMEDETOMIDINE HCL 200 MCG/2ML IV SOLN
INTRAVENOUS | Status: DC | PRN
  Administered 2017-12-24: 12 ug via INTRAVENOUS
  Administered 2017-12-24: 16 ug via INTRAVENOUS
  Administered 2017-12-24: 12 ug via INTRAVENOUS

## 2017-12-24 MED ORDER — FENTANYL CITRATE (PF) 100 MCG/2ML IJ SOLN
INTRAMUSCULAR | Status: AC
  Filled 2017-12-24: qty 2

## 2017-12-24 MED ORDER — SODIUM CHLORIDE 0.9 % IV SOLN
INTRAVENOUS | Status: DC | PRN
  Administered 2017-12-24: 1000 mL

## 2017-12-24 MED ORDER — BUPIVACAINE-EPINEPHRINE (PF) 0.5% -1:200000 IJ SOLN
INTRAMUSCULAR | Status: AC
  Filled 2017-12-24: qty 30

## 2017-12-24 MED ORDER — MIDAZOLAM HCL 2 MG/2ML IJ SOLN
INTRAMUSCULAR | Status: AC
  Filled 2017-12-24: qty 2

## 2017-12-24 MED ORDER — CEFAZOLIN SODIUM-DEXTROSE 2-4 GM/100ML-% IV SOLN
2.0000 g | INTRAVENOUS | Status: AC
  Administered 2017-12-24: 2 g via INTRAVENOUS
  Filled 2017-12-24: qty 100

## 2017-12-24 SURGICAL SUPPLY — 51 items
ADH SKN CLS APL DERMABOND .7 (GAUZE/BANDAGES/DRESSINGS) ×1
APL SKNCLS STERI-STRIP NONHPOA (GAUZE/BANDAGES/DRESSINGS) ×1
BAG URINE DRAINAGE (UROLOGICAL SUPPLIES) ×2 IMPLANT
BENZOIN TINCTURE PRP APPL 2/3 (GAUZE/BANDAGES/DRESSINGS) ×2 IMPLANT
BLADE HEX COATED 2.75 (ELECTRODE) ×2 IMPLANT
BLADE SURG 15 STRL LF DISP TIS (BLADE) ×1 IMPLANT
BLADE SURG 15 STRL SS (BLADE) ×2
BNDG GAUZE ELAST 4 BULKY (GAUZE/BANDAGES/DRESSINGS) ×2 IMPLANT
BRIEF STRETCH FOR OB PAD LRG (UNDERPADS AND DIAPERS) ×1 IMPLANT
CATH FOLEY 2WAY SLVR  5CC 16FR (CATHETERS) ×1
CATH FOLEY 2WAY SLVR 5CC 16FR (CATHETERS) ×1 IMPLANT
CHLORAPREP W/TINT 26ML (MISCELLANEOUS) ×2 IMPLANT
COVER MAYO STAND STRL (DRAPES) ×2 IMPLANT
COVER SURGICAL LIGHT HANDLE (MISCELLANEOUS) ×2 IMPLANT
DERMABOND ADVANCED (GAUZE/BANDAGES/DRESSINGS) ×1
DERMABOND ADVANCED .7 DNX12 (GAUZE/BANDAGES/DRESSINGS) IMPLANT
DISSECTOR ROUND CHERRY 3/8 STR (MISCELLANEOUS) ×2 IMPLANT
DRAPE EXTREMITY T 121X128X90 (DRAPE) ×2 IMPLANT
DRSG TEGADERM 4X4.75 (GAUZE/BANDAGES/DRESSINGS) ×2 IMPLANT
ELECT REM PT RETURN 15FT ADLT (MISCELLANEOUS) ×2 IMPLANT
GLOVE BIOGEL M 8.0 STRL (GLOVE) ×4 IMPLANT
GOWN STRL REUS W/TWL XL LVL3 (GOWN DISPOSABLE) ×2 IMPLANT
KIT ASSEMBLY MENTOR (KITS) ×1 IMPLANT
KIT BASIN OR (CUSTOM PROCEDURE TRAY) ×2 IMPLANT
KIT TITAN ASSEMBLY (Erectile Restoration) ×1 IMPLANT
KIT TITAN ASSEMBLY STANDARD (Erectile Restoration) ×1 IMPLANT
KIT TITAN ASSEMBLY STD (Erectile Restoration) IMPLANT
LUBRICANT JELLY K Y 4OZ (MISCELLANEOUS) ×2 IMPLANT
NEEDLE HYPO 22GX1.5 SAFETY (NEEDLE) ×2 IMPLANT
NS IRRIG 1000ML POUR BTL (IV SOLUTION) ×2 IMPLANT
PACK GENERAL/GYN (CUSTOM PROCEDURE TRAY) ×2 IMPLANT
PLUG CATH AND CAP STER (CATHETERS) ×2 IMPLANT
PROS TITAN SCROT 0 ANG 22CM (Erectile Restoration) ×2 IMPLANT
PROSTHESIS TTN SCRO 0 ANG 22CM (Erectile Restoration) IMPLANT
RESERVOIR TITAN 12.5CC W/VLV ×1 IMPLANT
RETRACTOR WILSON SYSTEM (INSTRUMENTS) ×3 IMPLANT
SOAP 2 % CHG 4 OZ (WOUND CARE) ×2 IMPLANT
STRIP CLOSURE SKIN 1/2X4 (GAUZE/BANDAGES/DRESSINGS) ×2 IMPLANT
SUPPORT SCROTAL LG STRP (MISCELLANEOUS) ×2 IMPLANT
SUT CHROMIC 3 0 SH 27 (SUTURE) ×4 IMPLANT
SUT PDS AB 2-0 CT2 27 (SUTURE) ×8 IMPLANT
SUT SILK 3 0 SH 30 (SUTURE) ×1 IMPLANT
SUT VIC AB 2-0 SH 27 (SUTURE) ×8
SUT VIC AB 2-0 SH 27X BRD (SUTURE) IMPLANT
SUT VIC AB 2-0 UR6 27 (SUTURE) ×9 IMPLANT
SYR 10ML LL (SYRINGE) ×2 IMPLANT
SYR 20CC LL (SYRINGE) ×3 IMPLANT
SYR 50ML LL SCALE MARK (SYRINGE) ×4 IMPLANT
SYR CONTROL 10ML LL (SYRINGE) ×2 IMPLANT
TOWEL OR 17X26 10 PK STRL BLUE (TOWEL DISPOSABLE) ×5 IMPLANT
WATER STERILE IRR 500ML POUR (IV SOLUTION) ×2 IMPLANT

## 2017-12-24 NOTE — Anesthesia Preprocedure Evaluation (Addendum)
Anesthesia Evaluation  Patient identified by MRN, date of birth, ID band Patient awake    Reviewed: Allergy & Precautions, NPO status , Patient's Chart, lab work & pertinent test results  History of Anesthesia Complications (+) Emergence Delirium  Airway Mallampati: II  TM Distance: >3 FB     Dental   Pulmonary sleep apnea , COPD, former smoker,    breath sounds clear to auscultation       Cardiovascular hypertension, + CAD   Rhythm:Regular Rate:Normal     Neuro/Psych  Neuromuscular disease    GI/Hepatic hiatal hernia, PUD, GERD  ,  Endo/Other  diabetes  Renal/GU Renal disease     Musculoskeletal  (+) Arthritis ,   Abdominal   Peds  Hematology  (+) anemia ,   Anesthesia Other Findings   Reproductive/Obstetrics                             Anesthesia Physical Anesthesia Plan  ASA: III  Anesthesia Plan: General   Post-op Pain Management:    Induction: Intravenous  PONV Risk Score and Plan: 2 and Treatment may vary due to age or medical condition, Ondansetron, Dexamethasone and Midazolam  Airway Management Planned: Oral ETT  Additional Equipment:   Intra-op Plan:   Post-operative Plan: Possible Post-op intubation/ventilation  Informed Consent: I have reviewed the patients History and Physical, chart, labs and discussed the procedure including the risks, benefits and alternatives for the proposed anesthesia with the patient or authorized representative who has indicated his/her understanding and acceptance.   Dental advisory given  Plan Discussed with: CRNA and Anesthesiologist  Anesthesia Plan Comments:        Anesthesia Quick Evaluation

## 2017-12-24 NOTE — Discharge Instructions (Addendum)
Discharge instructions following scrotal surgery  Do not use the device for 6 weeks.  Be sure to complete your antibiotic course.  Be sure to pull the pump in the scrotum down several times daily to prevent it from migrating upward.  Call your doctor for:  Fever is greater than 100.5  Severe nausea or vomiting  Increasing pain not controlled by pain medication  Increasing redness or drainage from incisions  The number for questions or concerns is [phone]  Activity level: No lifting greater than 20 pounds (about equal to milk) for the next 2 weeks or until cleared to do so at follow-up appointment.  Otherwise activity as tolerated by comfort level.  Diet: May resume your regular diet as tolerated  Driving: No driving while still taking opiate pain medications (weight at least 6-8 hours after last dose).  No driving if you still sore from surgery as it may limit her ability to react quickly if necessary.   Shower/bath: May shower and get incision wet pad dry immediately following.  Do not scrub vigorously for the next 2-3 weeks.  Do not soak incision (ID soaking in bath or swimming) until told he may do so by Dr., as this may promote a wound infection.  Wound care: He may cover wounds with sterile gauze as needed to prevent incisions rubbing on close follow-up in any seepage.  Where tight fitting underpants/scrotal support for at least 2 weeks.  He should apply cold compresses (ice or sac of frozen peas/corn) to your scrotum for at least 48 hours to reduce the swelling for 15 minutes at a time indirectly.  You should expect that his scrotum will swell up initially and then get smaller over the next 2-4 weeks.  Follow-up appointments: Follow-up appointment will be scheduled with Dr. Gloriann Loan for a wound check.  General Anesthesia, Adult, Care After These instructions provide you with information about caring for yourself after your procedure. Your health care provider may also  give you more specific instructions. Your treatment has been planned according to current medical practices, but problems sometimes occur. Call your health care provider if you have any problems or questions after your procedure. What can I expect after the procedure? After the procedure, it is common to have:  Vomiting.  A sore throat.  Mental slowness.  It is common to feel:  Nauseous.  Cold or shivery.  Sleepy.  Tired.  Sore or achy, even in parts of your body where you did not have surgery.  Follow these instructions at home: For at least 24 hours after the procedure:  Do not: ? Participate in activities where you could fall or become injured. ? Drive. ? Use heavy machinery. ? Drink alcohol. ? Take sleeping pills or medicines that cause drowsiness. ? Make important decisions or sign legal documents. ? Take care of children on your own.  Rest. Eating and drinking  If you vomit, drink water, juice, or soup when you can drink without vomiting.  Drink enough fluid to keep your urine clear or pale yellow.  Make sure you have little or no nausea before eating solid foods.  Follow the diet recommended by your health care provider. General instructions  Have a responsible adult stay with you until you are awake and alert.  Return to your normal activities as told by your health care provider. Ask your health care provider what activities are safe for you.  Take over-the-counter and prescription medicines only as told by your health care provider.  If you smoke, do not smoke without supervision.  Keep all follow-up visits as told by your health care provider. This is important. Contact a health care provider if:  You continue to have nausea or vomiting at home, and medicines are not helpful.  You cannot drink fluids or start eating again.  You cannot urinate after 8-12 hours.  You develop a skin rash.  You have fever.  You have increasing redness at the  site of your procedure. Get help right away if:  You have difficulty breathing.  You have chest pain.  You have unexpected bleeding.  You feel that you are having a life-threatening or urgent problem. This information is not intended to replace advice given to you by your health care provider. Make sure you discuss any questions you have with your health care provider. Document Released: 10/09/2000 Document Revised: 12/06/2015 Document Reviewed: 06/17/2015 Elsevier Interactive Patient Education  [employer].

## 2017-12-24 NOTE — Op Note (Addendum)
Operative Note  Preoperative diagnosis:  1.  Malfunctioning 3-piece inflatable penile prosthesis  Postoperative diagnosis: 1.  malfunctioning 3-piece inflatable penile prosthesis 2.  Peyronie's disease  Procedure(s): 1.  Removal and replacement of three-piece Coloplast Titan inflatable penile prosthesis 2.  Penile remodeling/plastic operation on penis to correct angulation  Surgeon: Link Snuffer, MD  Assistants: None  Anesthesia: General  Complications: None immediate  EBL: 20 cc  Specimens: 1.  None  Drains/Catheters: 1.  3 piece inflatable penile prosthesis.  A 22 cm cylinder was used.  Intraoperative findings: 1.  Corporal length was 10 cm proximal and 12 cm distal bilaterally. 2.  22 cm bilateral cylinders placed 3.  125 cc reservoir was placed in the right retropubic space 4.  There is an approximately 20 to 30 degree ventral curve and after remodeling/correction of angulation, there was minimal curvature  Indication: 67 year old male previously had a placement of inflatable penile prosthesis that is now nonfunctional.  He presents for removal and replacement  Description of procedure:  The patient was identified and consent was obtained.  The patient was taken to the operating room and placed in the supine position.  The patient was placed under general anesthesia.  Perioperative antibiotics were administered consisting of vancomycin, gentamicin, and Ancef.  Patient was prepped and draped in a standard sterile fashion and a timeout was performed.  A Foley catheter was placed and a [place] retractor was used for retraction.  A 4 cm penoscrotal incision was made 1 cm proximal to the penoscrotal junction.  The dartos tissue was bluntly developed digitally.  I identified the pump and on cut setting, and sized over the pump and delivered it.  I then followed the tubing down to the right side and delivered the reservoir.  This was easily delivered.  I then followed the other 2  tubings down to the corpora and made a corporotomy bilaterally.  2-0 Vicryl sutures were placed into the corpora as stay sutures.  The remainder of the penile prosthetic was removed and passed off of the field.  It was discarded.  I then measured the corpora proximally and distally bilaterally with the measurements noted above.  I then performed a washout of the corpora as well as the reservoir space with a [place] washout first using antibiotic solution followed by half-strength Betadine solution then half-strength peroxide solution and then performed this in reverse order.  There is no evidence of any urethral injury.  We then prepared the device.  Prior to assembly, all members of the surgical team changed their over gloves to fresh sterile gloves.  I bluntly developed a space through the right inguinal ring and pierced transversalis fascia just over the pubic bone.  I then advanced the reservoir into the space and filled the reservoir with 110 cc of sterile saline.  I then used the furlough device to advance the needle through the corpora and through the glans distally.  I situated the cylinder proximally and distally and then secured down the 2-0 Vicryl stay sutures to secure down the tubing and closed the corporotomy.  I performed this bilaterally.  The prosthetic was situated nicely.  I then test inflated the device.  This revealed an approximately 20 to 30 degree curvature ventrally.  I therefore performed penile remodeling/correction of penile angulation x3 and this resulted in near resolution of the curvature.  I deflated the device.  I then created a dartos pouch and placed the pump into this and secured that down with a  2-0 Vicryl pursestring suture.  I connected the reservoir to the prosthetic.  I then buried the tubing with a 2-0 Vicryl star stitch.  I then closed the superficial dartos with a running 2-0 Vicryl followed by closure of the skin with a running 4-0 Monocryl.  Dermabond was applied.  A  dressing was applied and this concluded the operation.  The patient tolerated the procedure well and was stable postoperatively.  Plan: Discharge the patient home with 7 days of Bactrim.  Follow-up in 6 weeks for inflation of the device.

## 2017-12-24 NOTE — Transfer of Care (Signed)
Immediate Anesthesia Transfer of Care Note  Patient: [name] [name]  Procedure(s) Performed: REMOVAL AND  REPLACEMENT  COLOPLAST PENILE PROSTHESIS (N/A )  Patient Location: PACU  Anesthesia Type:General  Level of Consciousness: awake, sedated, patient cooperative and responds to stimulation  Airway & Oxygen Therapy: Patient connected to face mask oxygen  Post-op Assessment: Report given to RN and Post -op Vital signs reviewed and stable  Post vital signs: stable  Last Vitals:  Vitals Value Taken Time  BP 162/90 12/24/2017 12:15 PM  Temp    Pulse 75 12/24/2017 12:22 PM  Resp 19 12/24/2017 12:22 PM  SpO2 100 % 12/24/2017 12:22 PM  Vitals shown include unvalidated device data.  Last Pain:  Vitals:   12/24/17 5456  TempSrc:   PainSc: 0-No pain         Complications: No apparent anesthesia complications

## 2017-12-24 NOTE — H&P (Signed)
H&P  Chief Complaint: Malfunctioning penile prosthesis  History of Present Illness: 67 year old male with a 3 piece inflatable Coloplast inflatable penile prosthesis that is now nonfunctioning.  He desires removal and replacement.  Past Medical History:  Diagnosis Date  . Anemia   . Arthritis   . Laurier Jasperson palsy   . CAD (coronary artery disease)    a. 2014 MV: abnl w/ infap ischemia; b. 03/2013 Cath: aneurysmal bleb in the LAD w/ otw nonobs dzs-->Med Rx.  . Chronic back pain   . Chronic knee pain    a. 09/2015 s/p R TKA.  Marland Kitchen Chronic pain   . Chronic shoulder pain   . Chronic sinusitis   . CKD (chronic kidney disease), stage IV ([place])    stage 4 per office visit note of Dr Lowanda Foster on 05/2015   . COPD (chronic obstructive pulmonary disease) ([place])   . Diabetes mellitus without complication ([place])    type II   . Essential hypertension   . GERD (gastroesophageal reflux disease)   . Gout   . Hepatomegaly    noted on noncontrast CT 2015  . History of hiatal hernia   . Hyperlipidemia   . Lateral meniscus tear   . Obesity    Truncal  . Obstructive sleep apnea    does not use cpap   . On home oxygen therapy    uses 2l when is going somewhere per patient   . PUD (peptic ulcer disease)    remote, reports f/u EGD about 8 years ago unremarkable   . Reactive airway disease    related to exposure to chemical during 9/11  . Sinusitis   . Vitamin D deficiency    Past Surgical History:  Procedure Laterality Date  . ASAD LT SHOULDER  12/2008   left shoulder  . AV FISTULA PLACEMENT Left 08/09/2016   Procedure: BRACHIOCEPHALIC ARTERIOVENOUS (AV) FISTULA CREATION LEFT ARM;  Surgeon: [name], MD;  Location: [facility];  Service: Vascular;  Laterality: Left;  . CATARACT EXTRACTION W/PHACO Left 03/28/2016   Procedure: CATARACT EXTRACTION PHACO AND INTRAOCULAR LENS PLACEMENT LEFT EYE;  Surgeon: [name], MD;  [facility];  Service: Ophthalmology;  Laterality: Left;  CDE: 4.77  . CATARACT  EXTRACTION W/PHACO Right 04/11/2016   Procedure: CATARACT EXTRACTION PHACO AND INTRAOCULAR LENS PLACEMENT RIGHT EYE; CDE:  4.74;  Surgeon: [name], MD;  Location: [facility];  Service: Ophthalmology;  Laterality: Right;  . COLONOSCOPY  10/2008   Fields: Rectal polyp obliterated, not retrieved, hemorrhoids, single ascending colon diverticulum near the CV. Next colonoscopy April 2020  . COLONOSCOPY N/A 12/25/2014   SLF: 1. Colorectal polyps (2) removed 2. Small internal hemorrhoids 3. the left colon is severely redundant  . DOPPLER ECHOCARDIOGRAPHY    . ESOPHAGOGASTRODUODENOSCOPY N/A 12/25/2014   SLF: 1. Anemia most likely due to CRI, gastritis, gastric polyps 2. Moderate non-erosive gastriits and mild duodenitis.  3.TWo large gstric polyps removed.   Marland Kitchen EYE SURGERY  12/22/2010   tear duct probing-[place]  . FOREIGN BODY REMOVAL  03/29/2011   Procedure: REMOVAL FOREIGN BODY EXTREMITY;  Surgeon: Arther Abbott, MD;  Location: [facility];  Service: Orthopedics;  Laterality: Right;  Removal Foreign Body Right Thumb  . KNEE ARTHROSCOPY  10/2007   left  . KNEE ARTHROSCOPY WITH LATERAL MENISECTOMY Right 10/14/2015   Procedure: LEFT KNEE ARTHROSCOPY WITH PARTIAL LATERAL MENISECTOMY;  Surgeon: Carole Civil, MD;  Location: [facility];  Service: Orthopedics;  Laterality: Right;  . LEFT HEART CATHETERIZATION WITH  CORONARY ANGIOGRAM N/A 03/28/2013   Procedure: LEFT HEART CATHETERIZATION WITH CORONARY ANGIOGRAM;  Surgeon: Leonie Man, MD;  Location: [facility] CATH LAB;  Service: Cardiovascular;  Laterality: N/A;  . NM MYOVIEW LTD    . PENILE PROSTHESIS IMPLANT N/A 08/16/2015   Procedure: PENILE PROTHESIS INFLATABLE, three piece, Excisional biopsy of Penile ulcer, Penile molding;  Surgeon: Carolan Clines, MD;  Location: [facility];  Service: Urology;  Laterality: N/A;  . QUADRICEPS TENDON REPAIR  07/21/2011   Procedure: REPAIR QUADRICEP TENDON;  Surgeon: Arther Abbott, MD;  Location: [facility];  Service: Orthopedics;   Laterality: Right;  . TOENAIL EXCISION     removed [id number]  . UMBILICAL HERNIA REPAIR  2007   [place]    Home Medications:  Medications Prior to Admission  Medication Sig Dispense Refill Last Dose  . allopurinol (ZYLOPRIM) 100 MG tablet Take 100 mg by mouth daily.   12/24/2017 at 0415  . amLODipine (NORVASC) 10 MG tablet Take 10 mg by mouth daily.    12/24/2017 at 0415  . aspirin EC 81 MG tablet Take 1 tablet (81 mg total) by mouth daily. 90 tablet 3 Past Month at Unknown time  . budesonide-formoterol (SYMBICORT) 160-4.5 MCG/ACT inhaler Inhale 2 puffs into the lungs 2 (two) times daily as needed (Shortness of Breath).    Past Month at Unknown time  . calcitRIOL (ROCALTROL) 0.25 MCG capsule Take 0.25 mcg by mouth daily.   12/23/2017 at Unknown time  . cetirizine (ZYRTEC) 10 MG tablet Take 10 mg by mouth at bedtime as needed for allergies.    Past Month at Unknown time  . colchicine 0.6 MG tablet Take 0.5 tablets (0.3 mg total) by mouth daily. 10 tablet 0 Past Month at Unknown time  . diclofenac sodium (VOLTAREN) 1 % GEL Apply 1 application topically 2 (two) times daily as needed (muscle pain).   Past Week at Unknown time  . hydrALAZINE (APRESOLINE) 25 MG tablet Take 25 mg by mouth 3 (three) times daily.   12/23/2017 at Unknown time  . Insulin Glargine (LANTUS SOLOSTAR) 100 UNIT/ML Solostar Pen INJECT 30 UNITS SUBCUTANEOUSLY AT BEDTIME. 45 mL 0 12/23/2017 at Unknown time  . mometasone (NASONEX) 50 MCG/ACT nasal spray Place 2 sprays into the nose 2 (two) times daily as needed (allergies).    Past Month at Unknown time  . pantoprazole (PROTONIX) 40 MG tablet TAKE 1 TABLET TWICE DAILY 30 MINUTES PRIOR TO MEALS (Patient taking differently: Take 40 mg by mouth once daily) 180 tablet 3 12/23/2017 at Unknown time  . predniSONE (DELTASONE) 20 MG tablet Take 2 po QD x 4d then 1 po QD x 4d 16 tablet 0 Past Week at Unknown time  . simvastatin (ZOCOR) 20 MG tablet Take 20 mg by mouth every morning.    12/23/2017  at Unknown time  . TRADJENTA 5 MG TABS tablet TAKE 1 TABLET EVERY DAY 90 tablet 0 12/23/2017 at am  . acetaminophen (TYLENOL) 650 MG CR tablet Take 1,300 mg by mouth daily as needed for pain.   More than a month at Unknown time  . HYDROcodone-acetaminophen (NORCO/VICODIN) 5-325 MG tablet Take 1 tablet by mouth every 4 (four) hours as needed. 15 tablet 0 More than a month at Unknown time  . Insulin Pen Needle (PEN NEEDLES 3/16") 31G X 5 MM MISC Use as directed qhs 100 each 5 Taking   Allergies:  Allergies  Allergen Reactions  . Opana [Oxymorphone Hcl] Itching  . Tramadol Itching    Family History  Problem Relation Age of Onset  . Hypertension Mother        MI  . Cancer Mother        breast   . Diabetes Mother   . Diabetes Father   . Hypertension Father   . Hypertension Sister   . Diabetes Sister   . Arthritis Unknown   . Asthma Unknown   . Lung disease Unknown   . Anesthesia problems Neg Hx   . Hypotension Neg Hx   . Malignant hyperthermia Neg Hx   . Pseudochol deficiency Neg Hx   . Colon cancer Neg Hx    Social History:  reports that he quit smoking about 7 years ago. His smoking use included cigarettes. He has a 25.00 pack-year smoking history. He has never used smokeless tobacco. He reports that he drinks alcohol. He reports that he has current or past drug history. Drug: Marijuana.  ROS: A complete review of systems was performed.  All systems are negative except for pertinent findings as noted. ROS   Physical Exam:  Vital signs in last 24 hours: Temp:  [99 F (37.2 C)] 99 F (37.2 C) (06/10 0611) Pulse Rate:  [74] 74 (06/10 0611) Resp:  [18] 18 (06/10 0611) BP: (144)/(85) 144/85 (06/10 0611) SpO2:  [96 %] 96 % (06/10 0611) General:  Alert and oriented, No acute distress HEENT: Normocephalic, atraumatic Neck: No JVD or lymphadenopathy Cardiovascular: Regular rate and rhythm Lungs: Regular rate and effort Abdomen: Soft, nontender, nondistended, no abdominal  masses Back: No CVA tenderness Extremities: No edema Neurologic: Grossly intact  Laboratory Data:  Results for orders placed or performed during the hospital encounter of 12/24/17 (from the past 24 hour(s))  Rapid urine drug screen (hospital performed)     Status: None   Collection Time: 12/24/17  5:59 AM  Result Value Ref Range   Opiates NONE DETECTED NONE DETECTED   Cocaine NONE DETECTED NONE DETECTED   Benzodiazepines NONE DETECTED NONE DETECTED   Amphetamines NONE DETECTED NONE DETECTED   Tetrahydrocannabinol NONE DETECTED NONE DETECTED   Barbiturates NONE DETECTED NONE DETECTED  Glucose, capillary     Status: Abnormal   Collection Time: 12/24/17  6:20 AM  Result Value Ref Range   Glucose-Capillary 103 (H) 65 - 99 mg/dL   Comment 1 Notify RN    Comment 2 Document in Chart    No results found for this or any previous visit (from the past 240 hour(s)). Creatinine: No results for input(s): CREATININE in the last 168 hours.  Impression/Assessment:  Malfunctioning penile prosthesis  Plan:  Removal and replacement of inflatable penile prosthesis.  He understands the potential risks including but not limited to bleeding, infection, injury to surrounding structures, need for additional procedures, specifically severe infection that would cause for the need to remove the prosthetic.  Marton Redwood, III 12/24/2017, 7:58 AM

## 2017-12-24 NOTE — Anesthesia Procedure Notes (Signed)
Procedure Name: Intubation Date/Time: 12/24/2017 9:01 AM Performed by: Pilar Grammes, CRNA Pre-anesthesia Checklist: Patient identified, Emergency Drugs available, Suction available, Patient being monitored and Timeout performed Patient Re-evaluated:Patient Re-evaluated prior to induction Oxygen Delivery Method: Circle system utilized Preoxygenation: Pre-oxygenation with 100% oxygen Induction Type: IV induction Ventilation: Two handed mask ventilation required Laryngoscope Size: Mac, 4 and Glidescope Grade View: Grade III Tube type: Oral Tube size: 7.5 mm Number of attempts: 1 Airway Equipment and Method: Stylet Placement Confirmation: positive ETCO2,  ETT inserted through vocal cords under direct vision,  CO2 detector and breath sounds checked- equal and bilateral Tube secured with: Tape Dental Injury: Teeth and Oropharynx as per pre-operative assessment

## 2017-12-24 NOTE — Anesthesia Postprocedure Evaluation (Signed)
Anesthesia Post Note  Patient: [name] [name]  Procedure(s) Performed: REMOVAL AND  REPLACEMENT  COLOPLAST PENILE PROSTHESIS (N/A )     Patient location during evaluation: PACU Anesthesia Type: General Level of consciousness: awake and alert Pain management: pain level controlled Vital Signs Assessment: post-procedure vital signs reviewed and stable Respiratory status: spontaneous breathing, nonlabored ventilation and respiratory function stable Cardiovascular status: blood pressure returned to baseline and stable Postop Assessment: no apparent nausea or vomiting Anesthetic complications: no    Last Vitals:  Vitals:   12/24/17 1300 12/24/17 1315  BP: 139/85 140/82  Pulse: 66 65  Resp: 13 16  Temp:  (!) 36.3 C  SpO2: 95% 98%    Last Pain:  Vitals:   12/24/17 1355  TempSrc:   PainSc: 0-No pain                 Lynda Rainwater

## 2017-12-26 DIAGNOSIS — M109 Gout, unspecified: Secondary | ICD-10-CM | POA: Diagnosis not present

## 2017-12-26 DIAGNOSIS — G4733 Obstructive sleep apnea (adult) (pediatric): Secondary | ICD-10-CM | POA: Diagnosis not present

## 2017-12-26 DIAGNOSIS — E1129 Type 2 diabetes mellitus with other diabetic kidney complication: Secondary | ICD-10-CM | POA: Diagnosis not present

## 2017-12-26 DIAGNOSIS — N185 Chronic kidney disease, stage 5: Secondary | ICD-10-CM | POA: Diagnosis not present

## 2017-12-26 DIAGNOSIS — I10 Essential (primary) hypertension: Secondary | ICD-10-CM | POA: Diagnosis not present

## 2017-12-26 DIAGNOSIS — R809 Proteinuria, unspecified: Secondary | ICD-10-CM | POA: Diagnosis not present

## 2018-01-01 DIAGNOSIS — N184 Chronic kidney disease, stage 4 (severe): Secondary | ICD-10-CM | POA: Diagnosis not present

## 2018-01-01 DIAGNOSIS — Z1331 Encounter for screening for depression: Secondary | ICD-10-CM | POA: Diagnosis not present

## 2018-01-01 DIAGNOSIS — I10 Essential (primary) hypertension: Secondary | ICD-10-CM | POA: Diagnosis not present

## 2018-01-01 DIAGNOSIS — E1129 Type 2 diabetes mellitus with other diabetic kidney complication: Secondary | ICD-10-CM | POA: Diagnosis not present

## 2018-01-01 DIAGNOSIS — E1165 Type 2 diabetes mellitus with hyperglycemia: Secondary | ICD-10-CM | POA: Diagnosis not present

## 2018-01-01 DIAGNOSIS — Z1389 Encounter for screening for other disorder: Secondary | ICD-10-CM | POA: Diagnosis not present

## 2018-01-01 DIAGNOSIS — E119 Type 2 diabetes mellitus without complications: Secondary | ICD-10-CM | POA: Diagnosis not present

## 2018-01-04 ENCOUNTER — Other Ambulatory Visit: Payer: Self-pay | Admitting: "Endocrinology

## 2018-02-06 DIAGNOSIS — N5201 Erectile dysfunction due to arterial insufficiency: Secondary | ICD-10-CM | POA: Diagnosis not present

## 2018-02-12 DIAGNOSIS — L851 Acquired keratosis [keratoderma] palmaris et plantaris: Secondary | ICD-10-CM | POA: Diagnosis not present

## 2018-02-12 DIAGNOSIS — B351 Tinea unguium: Secondary | ICD-10-CM | POA: Diagnosis not present

## 2018-02-12 DIAGNOSIS — E1142 Type 2 diabetes mellitus with diabetic polyneuropathy: Secondary | ICD-10-CM | POA: Diagnosis not present

## 2018-02-22 DIAGNOSIS — D519 Vitamin B12 deficiency anemia, unspecified: Secondary | ICD-10-CM | POA: Diagnosis not present

## 2018-02-22 DIAGNOSIS — R809 Proteinuria, unspecified: Secondary | ICD-10-CM | POA: Diagnosis not present

## 2018-02-22 DIAGNOSIS — E1129 Type 2 diabetes mellitus with other diabetic kidney complication: Secondary | ICD-10-CM | POA: Diagnosis not present

## 2018-02-22 DIAGNOSIS — N184 Chronic kidney disease, stage 4 (severe): Secondary | ICD-10-CM | POA: Diagnosis not present

## 2018-02-22 DIAGNOSIS — M109 Gout, unspecified: Secondary | ICD-10-CM | POA: Diagnosis not present

## 2018-02-22 DIAGNOSIS — D509 Iron deficiency anemia, unspecified: Secondary | ICD-10-CM | POA: Diagnosis not present

## 2018-02-24 ENCOUNTER — Other Ambulatory Visit: Payer: Self-pay | Admitting: "Endocrinology

## 2018-02-25 DIAGNOSIS — N184 Chronic kidney disease, stage 4 (severe): Secondary | ICD-10-CM | POA: Diagnosis not present

## 2018-02-25 DIAGNOSIS — E1165 Type 2 diabetes mellitus with hyperglycemia: Secondary | ICD-10-CM | POA: Diagnosis not present

## 2018-02-25 DIAGNOSIS — L259 Unspecified contact dermatitis, unspecified cause: Secondary | ICD-10-CM | POA: Diagnosis not present

## 2018-02-25 DIAGNOSIS — I10 Essential (primary) hypertension: Secondary | ICD-10-CM | POA: Diagnosis not present

## 2018-02-28 ENCOUNTER — Encounter: Payer: Self-pay | Admitting: Gastroenterology

## 2018-02-28 ENCOUNTER — Ambulatory Visit (INDEPENDENT_AMBULATORY_CARE_PROVIDER_SITE_OTHER): Payer: Medicare Other | Admitting: Gastroenterology

## 2018-02-28 ENCOUNTER — Other Ambulatory Visit: Payer: Self-pay

## 2018-02-28 DIAGNOSIS — K5900 Constipation, unspecified: Secondary | ICD-10-CM | POA: Insufficient documentation

## 2018-02-28 DIAGNOSIS — K219 Gastro-esophageal reflux disease without esophagitis: Secondary | ICD-10-CM | POA: Diagnosis not present

## 2018-02-28 DIAGNOSIS — K5901 Slow transit constipation: Secondary | ICD-10-CM | POA: Diagnosis not present

## 2018-02-28 DIAGNOSIS — K760 Fatty (change of) liver, not elsewhere classified: Secondary | ICD-10-CM

## 2018-02-28 DIAGNOSIS — I2510 Atherosclerotic heart disease of native coronary artery without angina pectoris: Secondary | ICD-10-CM | POA: Diagnosis not present

## 2018-02-28 MED ORDER — PANTOPRAZOLE SODIUM 40 MG PO TBEC: DELAYED_RELEASE_TABLET | ORAL | 3 refills | Status: DC

## 2018-02-28 NOTE — Assessment & Plan Note (Addendum)
WEIGHT UP 2 LBS.   DRINK WATER TO KEEP YOUR URINE LIGHT YELLOW.  AVOID SWEET TEA, SODAS, POWER ADE, AND FRUIT JUICE. WALK 30 MINS 5 TIMES A WEEK. AVOID REFLUX TRIGGERS.  HANDOUT GIVEN. CONTINUE YOUR WEIGHT LOSS EFFORTS. FOLLOW A HIGH FIBER DIET. AVOID ITEMS THAT CAUSE BLOATING & GAS. FOLLOW UP IN 6 MOS.

## 2018-02-28 NOTE — Progress Notes (Signed)
Subjective:    Patient ID: [name] [name], male    DOB: 1951/05/14, 67 y.o.   MRN: [id number]  [name], MD  HPI FEELS LIKE DOESN'T GET EMPTY.  BMs: EVERY OTHER DAY SOMETIMES AND MOST TIMES ONCE A DAY. USED COULD TELL TIME BY BMs. DRINKS WATER. EATS FIBER(NOT EVERY MEAL). LOVES VEGGIES AND FRUITS BUT DOESN'T GET IT. DRINK SODAS(DIET MT DEW, REG DR. PEPPER, REG GINGER ALE) EVERY DAY. HEARTBURN OUT OF CONTROL. ALTERNATES COFFEE: 1 CU REGULAR, OR TEA: 1 CUP IN AM. SOB: WHEN MOVING AROUND, OLD. USUALLY AWAKE AT 7 AM.   PT DENIES FEVER, CHILLS, HEMATOCHEZIA, HEMATEMESIS, nausea, vomiting, melena, diarrhea, CHEST PAIN, CHANGE IN BOWEL IN HABITS, abdominal pain, problems swallowing, OR problems with sedation.  Past Medical History:  Diagnosis Date  . Anemia   . Arthritis   . Bell palsy   . CAD (coronary artery disease)    a. 2014 MV: abnl w/ infap ischemia; b. 03/2013 Cath: aneurysmal bleb in the LAD w/ otw nonobs dzs-->Med Rx.  . Chronic back pain   . Chronic knee pain    a. 09/2015 s/p R TKA.  Marland Kitchen Chronic pain   . Chronic shoulder pain   . Chronic sinusitis   . CKD (chronic kidney disease), stage IV ([place])    stage 4 per office visit note of Dr Lowanda Foster on 05/2015   . COPD (chronic obstructive pulmonary disease) ([place])   . Diabetes mellitus without complication ([place])    type II   . Essential hypertension   . GERD (gastroesophageal reflux disease)   . Gout   . Hepatomegaly    noted on noncontrast CT 2015  . History of hiatal hernia   . Hyperlipidemia   . Lateral meniscus tear   . Obesity    Truncal  . Obstructive sleep apnea    does not use cpap   . On home oxygen therapy    uses 2l when is going somewhere per patient   . PUD (peptic ulcer disease)    remote, reports f/u EGD about 8 years ago unremarkable   . Reactive airway disease    related to exposure to chemical during 9/11  . Sinusitis   . Vitamin D deficiency    Past Surgical History:  Procedure Laterality Date    . ASAD LT SHOULDER  12/2008   left shoulder  . AV FISTULA PLACEMENT Left 08/09/2016   Procedure: BRACHIOCEPHALIC ARTERIOVENOUS (AV) FISTULA CREATION LEFT ARM;  Surgeon: [name], MD;  Location: [facility];  Service: Vascular;  Laterality: Left;  . CATARACT EXTRACTION W/PHACO Left 03/28/2016   Procedure: CATARACT EXTRACTION PHACO AND INTRAOCULAR LENS PLACEMENT LEFT EYE;  Surgeon: [name], MD;  [facility];  Service: Ophthalmology;  Laterality: Left;  CDE: 4.77  . CATARACT EXTRACTION W/PHACO Right 04/11/2016   Procedure: CATARACT EXTRACTION PHACO AND INTRAOCULAR LENS PLACEMENT RIGHT EYE; CDE:  4.74;  Surgeon: [name], MD;  [facility];  Service: Ophthalmology;  Laterality: Right;  . COLONOSCOPY  10/2008   Latisha Lasch: Rectal polyp obliterated, not retrieved, hemorrhoids, single ascending colon diverticulum near the CV. Next colonoscopy April 2020  . COLONOSCOPY N/A 12/25/2014   SLF: 1. Colorectal polyps (2) removed 2. Small internal hemorrhoids 3. the left colon is severely redundant  . DOPPLER ECHOCARDIOGRAPHY    . ESOPHAGOGASTRODUODENOSCOPY N/A 12/25/2014   SLF: 1. Anemia most likely due to CRI, gastritis, gastric polyps 2. Moderate non-erosive gastriits and mild duodenitis.  3.TWo large gstric polyps removed.   Marland Kitchen  EYE SURGERY  12/22/2010   tear duct probing-[place]  . FOREIGN BODY REMOVAL  03/29/2011   Procedure: REMOVAL FOREIGN BODY EXTREMITY;  Surgeon: Arther Abbott, MD;  Location: [facility];  Service: Orthopedics;  Laterality: Right;  Removal Foreign Body Right Thumb  . KNEE ARTHROSCOPY  10/2007   left  . KNEE ARTHROSCOPY WITH LATERAL MENISECTOMY Right 10/14/2015   Procedure: LEFT KNEE ARTHROSCOPY WITH PARTIAL LATERAL MENISECTOMY;  Surgeon: Carole Civil, MD;  Location: [facility];  Service: Orthopedics;  Laterality: Right;  . LEFT HEART CATHETERIZATION WITH CORONARY ANGIOGRAM N/A 03/28/2013   Procedure: LEFT HEART CATHETERIZATION WITH CORONARY ANGIOGRAM;  Surgeon: Leonie Man, MD;  Location: [facility];  Service: Cardiovascular;  Laterality: N/A;  . NM MYOVIEW LTD    . PENILE PROSTHESIS IMPLANT N/A 08/16/2015   Procedure: PENILE PROTHESIS INFLATABLE, three piece, Excisional biopsy of Penile ulcer, Penile molding;  Surgeon: Carolan Clines, MD;  [facility];  Service: Urology;  Laterality: N/A;  . PENILE PROSTHESIS IMPLANT N/A 12/24/2017   Procedure: REMOVAL AND  REPLACEMENT  COLOPLAST PENILE PROSTHESIS;  Surgeon: Lucas Mallow, MD;  [facility];  Service: Urology;  Laterality: N/A;  . QUADRICEPS TENDON REPAIR  07/21/2011   Procedure: REPAIR QUADRICEP TENDON;  Surgeon: Arther Abbott, MD;  [facility];  Service: Orthopedics;  Laterality: Right;  . TOENAIL EXCISION     removed [id number]  . UMBILICAL HERNIA REPAIR  2007   [place]   Allergies  Allergen Reactions  . Opana [Oxymorphone Hcl] Itching  . Tramadol Itching    Current Outpatient Medications  Medication Sig    . acetaminophen (TYLENOL) 650 MG CR tablet Take 1,300 mg by mouth daily as needed for pain.    Marland Kitchen allopurinol (ZYLOPRIM) 100 MG tablet Take 100 mg by mouth daily.    Marland Kitchen amLODipine (NORVASC) 10 MG tablet Take 10 mg by mouth daily.     Marland Kitchen aspirin EC 81 MG tablet Take 1 tablet (81 mg total) by mouth daily.    . budesonide-formoterol (SYMBICORT) 160-4.5 MCG/ACT inhaler Inhale 2 puffs into the lungs 2 (two) times daily as needed (Shortness of Breath).     . calcitRIOL (ROCALTROL) 0.25 MCG capsule Take 0.25 mcg by mouth daily.    . cetirizine (ZYRTEC) 10 MG tablet Take 10 mg by mouth at bedtime as needed for allergies.     Marland Kitchen colchicine 0.6 MG tablet Take 0.5 tablets (0.3 mg total) by mouth daily.    . diclofenac sodium (VOLTAREN) 1 % GEL Apply 1 application topically 2 (two) times daily as needed (muscle pain).    . hydrALAZINE (APRESOLINE) 25 MG tablet Take 25 mg by mouth 3 (three) times daily.    . Insulin Glargine (LANTUS SOLOSTAR) 100 UNIT/ML Solostar Pen INJECT 30  UNITS SUBCUTANEOUSLY AT BEDTIME.    . Insulin Pen Needle (PEN NEEDLES 3/16") 31G X 5 MM MISC Use as directed qhs    . mometasone (NASONEX) 50 MCG/ACT nasal spray Place 2 sprays into the nose 2 (two) times daily as needed (allergies).     . pantoprazole (PROTONIX) 40 MG tablet TAKE 1 TABLET TWICE DAILY 30 MINUTES PRIOR TO MEALS)    .      . simvastatin (ZOCOR) 20 MG tablet Take 20 mg by mouth every morning.     . TRADJENTA 5 MG TABS tablet TAKE 1 TABLET EVERY DAY    .      Marland Kitchen      Marland Kitchen  Review of Systems PER HPI OTHERWISE ALL SYSTEMS ARE NEGATIVE.    Objective:   Physical Exam  Constitutional: He is oriented to person, place, and time. He appears well-developed and well-nourished. No distress.  HENT:  Head: Normocephalic and atraumatic.  Mouth/Throat: Oropharynx is clear and moist. No oropharyngeal exudate.  Eyes: Pupils are equal, round, and reactive to light. No scleral icterus.  Neck: Normal range of motion. Neck supple.  Cardiovascular: Normal rate, regular rhythm and normal heart sounds.  Pulmonary/Chest: Effort normal and breath sounds normal. No respiratory distress.  Abdominal: Soft. Bowel sounds are normal. He exhibits no distension. There is no tenderness.  Musculoskeletal: He exhibits no edema.  Lymphadenopathy:    He has no cervical adenopathy.  Neurological: He is alert and oriented to person, place, and time.  NO FOCAL DEFICITS  Psychiatric: He has a normal mood and affect.  Vitals reviewed.     Assessment & Plan:

## 2018-02-28 NOTE — Progress Notes (Signed)
ON RECALL  °

## 2018-02-28 NOTE — Assessment & Plan Note (Addendum)
SYMPTOMS NOT CONTROLLED.   DRINK WATER TO KEEP YOUR URINE LIGHT YELLOW. AVOID SWEET TEA, SODAS, POWER ADE, AND FRUIT JUICE. CONTINUE YOUR WEIGHT LOSS EFFORTS. FOLLOW A HIGH FIBER DIET. AVOID ITEMS THAT CAUSE BLOATING & GAS. TRY HERBAL TEA OR LINZESS TO HAVE A COMPLETE BOWEL MOVEMENT. FOLLOW UP IN 6 MOS.

## 2018-02-28 NOTE — Assessment & Plan Note (Signed)
SYMPTOMS NOT CONTROLLED WHILE DRINKING SODAS AND CAFFEINE.  DRINK WATER TO KEEP YOUR URINE LIGHT YELLOW.  AVOID SWEET TEA, SODAS, POWER ADE, AND FRUIT JUICE.  AVOID REFLUX TRIGGERS.  HANDOUT GIVEN. CONTINUE YOUR WEIGHT LOSS EFFORTS. FOLLOW A HIGH FIBER DIET. AVOID ITEMS THAT CAUSE BLOATING & GAS. TRY HERBAL TEA OR LINZESS TO HAVE A COMPLETE BOWEL MOVEMENT.   FOLLOW UP IN 6 MOS.

## 2018-02-28 NOTE — Patient Instructions (Addendum)
DRINK WATER TO KEEP YOUR URINE LIGHT YELLOW.  AVOID SWEET TEA, SODAS, POWER ADE, AND FRUIT JUICE.  AVOID REFLUX TRIGGERS. SEE INFO BELOW.   CONTINUE YOUR WEIGHT LOSS EFFORTS.  FOLLOW A HIGH FIBER DIET. AVOID ITEMS THAT CAUSE BLOATING & GAS.   TRY HERBAL TEA OR LINZESS TO HAVE A COMPLETE BOWEL MOVEMENT.   FOLLOW UP IN 6 MOS.    Lifestyle and home remedies TO MANAGE REFLUX/CHEST PAIN  You may eliminate or reduce the frequency of heartburn by making the following lifestyle changes:  . Control your weight. Being overweight is a major risk factor for heartburn and GERD. Excess pounds put pressure on your abdomen, pushing up your stomach and causing acid to back up into your esophagus.   . Eat smaller meals. 4 TO 6 MEALS A DAY. This reduces pressure on the lower esophageal sphincter, helping to prevent the valve from opening and acid from washing back into your esophagus.   Dolphus Jenny your belt. Clothes that fit tightly around your waist put pressure on your abdomen and the lower esophageal sphincter.   . Eliminate heartburn triggers. Everyone has specific triggers. Common triggers such as fatty or fried foods, spicy food, tomato sauce, carbonated beverages, alcohol, chocolate, mint, garlic, onion, caffeine and nicotine may make heartburn worse.   Marland Kitchen Avoid stooping or bending. Tying your shoes is OK. Bending over for longer periods to weed your garden isn't, especially soon after eating.   . Don't lie down after a meal. Wait at least three to four hours after eating before going to bed, and don't lie down right after eating.   Marland Kitchen PUT THE HEAD OF YOUR BED ON 6 INCH BLOCKS.   Alternative medicine . Several home remedies exist for treating GERD, but they provide only temporary relief. They include drinking baking soda (sodium bicarbonate) added to water or drinking other fluids such as baking soda mixed with cream of tartar and water.  . Although these liquids create temporary relief by  neutralizing, washing away or buffering acids, eventually they aggravate the situation by adding gas and fluid to your stomach, increasing pressure and causing more acid reflux. Further, adding more sodium to your diet may increase your blood pressure and add stress to your heart, and excessive bicarbonate ingestion can alter the acid-base balance in your body.      Low-Fat Diet BREADS, CEREALS, PASTA, RICE, DRIED PEAS, AND BEANS These products are high in carbohydrates and most are low in fat. Therefore, they can be increased in the diet as substitutes for fatty foods. They too, however, contain calories and should not be eaten in excess. Cereals can be eaten for snacks as well as for breakfast.   FRUITS AND VEGETABLES It is good to eat fruits and vegetables. Besides being sources of fiber, both are rich in vitamins and some minerals. They help you get the daily allowances of these nutrients. Fruits and vegetables can be used for snacks and desserts.  MEATS Limit lean meat, chicken, [place], and fish to no more than 6 ounces per day. Beef, Pork, and Lamb Use lean cuts of beef, pork, and lamb. Lean cuts include:  Extra-lean ground beef.  Arm roast.  Sirloin tip.  Center-cut ham.  Round steak.  Loin chops.  Rump roast.  Tenderloin.  Trim all fat off the outside of meats before cooking. It is not necessary to severely decrease the intake of red meat, but lean choices should be made. Lean meat is rich in protein and  contains a highly absorbable form of iron. Premenopausal women, in particular, should avoid reducing lean red meat because this could increase the risk for low red blood cells (iron-deficiency anemia).  Chicken and [place] These are good sources of protein. The fat of poultry can be reduced by removing the skin and underlying fat layers before cooking. Chicken and [place] can be substituted for lean red meat in the diet. Poultry should not be fried or covered with high-fat  sauces. Fish and Shellfish Fish is a good source of protein. Shellfish contain cholesterol, but they usually are low in saturated fatty acids. The preparation of fish is important. Like chicken and [place], they should not be fried or covered with high-fat sauces. EGGS Egg whites contain no fat or cholesterol. They can be eaten often. Try 1 to 2 egg whites instead of whole eggs in recipes or use egg substitutes that do not contain yolk. MILK AND DAIRY PRODUCTS Use skim or 1% milk instead of 2% or whole milk. Decrease whole milk, natural, and processed cheeses. Use nonfat or low-fat (2%) cottage cheese or low-fat cheeses made from vegetable oils. Choose nonfat or low-fat (1 to 2%) yogurt. Experiment with evaporated skim milk in recipes that call for heavy cream. Substitute low-fat yogurt or low-fat cottage cheese for sour cream in dips and salad dressings. Have at least 2 servings of low-fat dairy products, such as 2 glasses of skim (or 1%) milk each day to help get your daily calcium intake. FATS AND OILS Reduce the total intake of fats, especially saturated fat. Butterfat, lard, and beef fats are high in saturated fat and cholesterol. These should be avoided as much as possible. Vegetable fats do not contain cholesterol, but certain vegetable fats, such as coconut oil, palm oil, and palm kernel oil are very high in saturated fats. These should be limited. These fats are often used in bakery goods, processed foods, popcorn, oils, and nondairy creamers. Vegetable shortenings and some peanut butters contain hydrogenated oils, which are also saturated fats. Read the labels on these foods and check for saturated vegetable oils. Unsaturated vegetable oils and fats do not raise blood cholesterol. However, they should be limited because they are fats and are high in calories. Total fat should still be limited to 30% of your daily caloric intake. Desirable liquid vegetable oils are corn oil, cottonseed oil, olive  oil, canola oil, safflower oil, soybean oil, and sunflower oil. Peanut oil is not as good, but small amounts are acceptable. Buy a heart-healthy tub margarine that has no partially hydrogenated oils in the ingredients. Mayonnaise and salad dressings often are made from unsaturated fats, but they should also be limited because of their high calorie and fat content. Seeds, nuts, peanut butter, olives, and avocados are high in fat, but the fat is mainly the unsaturated type. These foods should be limited mainly to avoid excess calories and fat. OTHER EATING TIPS Snacks  Most sweets should be limited as snacks. They tend to be rich in calories and fats, and their caloric content outweighs their nutritional value. Some good choices in snacks are graham crackers, melba toast, soda crackers, bagels (no egg), English muffins, fruits, and vegetables. These snacks are preferable to snack crackers, [place] fries, TORTILLA CHIPS, and POTATO chips. Popcorn should be air-popped or cooked in small amounts of liquid vegetable oil. Desserts Eat fruit, low-fat yogurt, and fruit ices instead of pastries, cake, and cookies. Sherbet, angel food cake, gelatin dessert, frozen low-fat yogurt, or other frozen products that  do not contain saturated fat (pure fruit juice bars, frozen ice pops) are also acceptable.  COOKING METHODS Choose those methods that use little or no fat. They include: Poaching.  Braising.  Steaming.  Grilling.  Baking.  Stir-frying.  Broiling.  Microwaving.  Foods can be cooked in a nonstick pan without added fat, or use a nonfat cooking spray in regular cookware. Limit fried foods and avoid frying in saturated fat. Add moisture to lean meats by using water, broth, cooking wines, and other nonfat or low-fat sauces along with the cooking methods mentioned above. Soups and stews should be chilled after cooking. The fat that forms on top after a few hours in the refrigerator should be skimmed off. When  preparing meals, avoid using excess salt. Salt can contribute to raising blood pressure in some people.  EATING AWAY FROM HOME Order entres, potatoes, and vegetables without sauces or butter. When meat exceeds the size of a deck of cards (3 to 4 ounces), the rest can be taken home for another meal. Choose vegetable or fruit salads and ask for low-calorie salad dressings to be served on the side. Use dressings sparingly. Limit high-fat toppings, such as bacon, crumbled eggs, cheese, sunflower seeds, and olives. Ask for heart-healthy tub margarine instead of butter.

## 2018-03-04 ENCOUNTER — Encounter (HOSPITAL_COMMUNITY)
Admission: RE | Admit: 2018-03-04 | Discharge: 2018-03-04 | Disposition: A | Discharge status: Home / Self Care | Payer: Medicare Other | Source: Ambulatory Visit | Attending: Nephrology | Admitting: Nephrology

## 2018-03-04 ENCOUNTER — Encounter (HOSPITAL_COMMUNITY): Payer: Self-pay

## 2018-03-04 DIAGNOSIS — D631 Anemia in chronic kidney disease: Secondary | ICD-10-CM | POA: Insufficient documentation

## 2018-03-04 DIAGNOSIS — E1122 Type 2 diabetes mellitus with diabetic chronic kidney disease: Secondary | ICD-10-CM | POA: Diagnosis not present

## 2018-03-04 DIAGNOSIS — N185 Chronic kidney disease, stage 5: Secondary | ICD-10-CM | POA: Diagnosis not present

## 2018-03-04 LAB — POCT HEMOGLOBIN-HEMACUE: Hemoglobin: 9.7 g/dL — ABNORMAL LOW (ref 13.0–17.0)

## 2018-03-04 MED ORDER — EPOETIN ALFA 4000 UNIT/ML IJ SOLN
4000.0000 [IU] | Freq: Once | INTRAMUSCULAR | Status: AC
  Administered 2018-03-04: 4000 [IU] via SUBCUTANEOUS

## 2018-03-04 MED ORDER — EPOETIN ALFA 4000 UNIT/ML IJ SOLN
INTRAMUSCULAR | Status: AC
  Filled 2018-03-04: qty 1

## 2018-03-04 NOTE — Progress Notes (Signed)
Patient and wife educated regarding procrit and written material provided.  Verbalized understanding of information discussed.

## 2018-03-04 NOTE — Discharge Instructions (Signed)
Epoetin Alfa injection °What is this medicine? °EPOETIN ALFA (e POE e tin AL fa) helps your body make more red blood cells. This medicine is used to treat anemia caused by chronic kidney failure, cancer chemotherapy, or HIV-therapy. It may also be used before surgery if you have anemia. °This medicine may be used for other purposes; ask your health care provider or pharmacist if you have questions. °COMMON BRAND NAME(S): Epogen, Procrit °What should I tell my health care provider before I take this medicine? °They need to know if you have any of these conditions: °-blood clotting disorders °-cancer patient not on chemotherapy °-cystic fibrosis °-heart disease, such as angina or heart failure °-hemoglobin level of 12 g/dL or greater °-high blood pressure °-low levels of folate, iron, or vitamin B12 °-seizures °-an unusual or allergic reaction to erythropoietin, albumin, benzyl alcohol, hamster proteins, other medicines, foods, dyes, or preservatives °-pregnant or trying to get pregnant °-breast-feeding °How should I use this medicine? °This medicine is for injection into a vein or under the skin. It is usually given by a health care professional in a hospital or clinic setting. °If you get this medicine at home, you will be taught how to prepare and give this medicine. Use exactly as directed. Take your medicine at regular intervals. Do not take your medicine more often than directed. °It is important that you put your used needles and syringes in a special sharps container. Do not put them in a trash can. If you do not have a sharps container, call your pharmacist or healthcare provider to get one. °A special MedGuide will be given to you by the pharmacist with each prescription and refill. Be sure to read this information carefully each time. °Talk to your pediatrician regarding the use of this medicine in children. While this drug may be prescribed for selected conditions, precautions do apply. °Overdosage: If you  think you have taken too much of this medicine contact a poison control center or emergency room at once. °NOTE: This medicine is only for you. Do not share this medicine with others. °What if I miss a dose? °If you miss a dose, take it as soon as you can. If it is almost time for your next dose, take only that dose. Do not take double or extra doses. °What may interact with this medicine? °Do not take this medicine with any of the following medications: °-darbepoetin alfa °This list may not describe all possible interactions. Give your health care provider a list of all the medicines, herbs, non-prescription drugs, or dietary supplements you use. Also tell them if you smoke, drink alcohol, or use illegal drugs. Some items may interact with your medicine. °What should I watch for while using this medicine? °Your condition will be monitored carefully while you are receiving this medicine. °You may need blood work done while you are taking this medicine. °What side effects may I notice from receiving this medicine? °Side effects that you should report to your doctor or health care professional as soon as possible: °-allergic reactions like skin rash, itching or hives, swelling of the face, lips, or tongue °-breathing problems °-changes in vision °-chest pain °-confusion, trouble speaking or understanding °-feeling faint or lightheaded, falls °-high blood pressure °-muscle aches or pains °-pain, swelling, warmth in the leg °-rapid weight gain °-severe headaches °-sudden numbness or weakness of the face, arm or leg °-trouble walking, dizziness, loss of balance or coordination °-seizures (convulsions) °-swelling of the ankles, feet, hands °-unusually weak or tired °  Side effects that usually do not require medical attention (report to your doctor or health care professional if they continue or are bothersome): °-diarrhea °-fever, chills (flu-like symptoms) °-headaches °-nausea, vomiting °-redness, stinging, or swelling at  site where injected °This list may not describe all possible side effects. Call your doctor for medical advice about side effects. You may report side effects to FDA at 1-800-FDA-1088. °Where should I keep my medicine? °Keep out of the reach of children. °Store in a refrigerator between 2 and 8 degrees C (36 and 46 degrees F). Do not freeze or shake. Throw away any unused portion if using a single-dose vial. Multi-dose vials can be kept in the refrigerator for up to 21 days after the initial dose. Throw away unused medicine. °NOTE: This sheet is a summary. It may not cover all possible information. If you have questions about this medicine, talk to your doctor, pharmacist, or health care provider. °© 2018 [employer] (2016-02-21 19:42:31) ° °

## 2018-03-04 NOTE — Progress Notes (Signed)
cc'd to pcp 

## 2018-03-05 LAB — HEMOGLOBIN A1C
HEMOGLOBIN A1C: 6.5 %{Hb} — AB (ref <5.7)
MEAN PLASMA GLUCOSE: 140 (calc)
eAG (mmol/L): 7.7 (calc)

## 2018-03-05 LAB — T4, FREE: Free T4: 1 ng/dL (ref 0.8–1.8)

## 2018-03-05 LAB — TSH: TSH: 0.83 mIU/L (ref 0.40–4.50)

## 2018-03-07 ENCOUNTER — Encounter: Payer: Self-pay | Admitting: "Endocrinology

## 2018-03-07 ENCOUNTER — Ambulatory Visit (INDEPENDENT_AMBULATORY_CARE_PROVIDER_SITE_OTHER): Payer: Medicare Other | Admitting: "Endocrinology

## 2018-03-07 VITALS — BP 119/70 | HR 79 | Ht 64.0 in | Wt 230.0 lb

## 2018-03-07 DIAGNOSIS — I2510 Atherosclerotic heart disease of native coronary artery without angina pectoris: Secondary | ICD-10-CM | POA: Diagnosis not present

## 2018-03-07 DIAGNOSIS — I10 Essential (primary) hypertension: Secondary | ICD-10-CM | POA: Diagnosis not present

## 2018-03-07 DIAGNOSIS — E1122 Type 2 diabetes mellitus with diabetic chronic kidney disease: Secondary | ICD-10-CM

## 2018-03-07 DIAGNOSIS — E782 Mixed hyperlipidemia: Secondary | ICD-10-CM

## 2018-03-07 DIAGNOSIS — N185 Chronic kidney disease, stage 5: Secondary | ICD-10-CM

## 2018-03-07 NOTE — Progress Notes (Signed)
Endocrinology follow-up note   Subjective:    Patient ID: [name] [name], male    DOB: 1950-09-27, PCP [name], MD   Past Medical History:  Diagnosis Date  . Anemia   . Arthritis   . Bell palsy   . CAD (coronary artery disease)    a. 2014 MV: abnl w/ infap ischemia; b. 03/2013 Cath: aneurysmal bleb in the LAD w/ otw nonobs dzs-->Med Rx.  . Chronic back pain   . Chronic knee pain    a. 09/2015 s/p R TKA.  Marland Kitchen Chronic pain   . Chronic shoulder pain   . Chronic sinusitis   . CKD (chronic kidney disease), stage IV ([place])    stage 4 per office visit note of Dr Lowanda Foster on 05/2015   . COPD (chronic obstructive pulmonary disease) ([place])   . Diabetes mellitus without complication ([place])    type II   . Essential hypertension   . GERD (gastroesophageal reflux disease)   . Gout   . Hepatomegaly    noted on noncontrast CT 2015  . History of hiatal hernia   . Hyperlipidemia   . Lateral meniscus tear   . Obesity    [place]  . Obstructive sleep apnea    does not use cpap   . On home oxygen therapy    uses 2l when is going somewhere per patient   . PUD (peptic ulcer disease)    remote, reports f/u EGD about 8 years ago unremarkable   . Reactive airway disease    related to exposure to chemical during 9/11  . Sinusitis   . Vitamin D deficiency    Past Surgical History:  Procedure Laterality Date  . ASAD LT SHOULDER  12/2008   left shoulder  . AV FISTULA PLACEMENT Left 08/09/2016   Procedure: BRACHIOCEPHALIC ARTERIOVENOUS (AV) FISTULA CREATION LEFT ARM;  Surgeon: [name], MD;  Location: [facility];  Service: Vascular;  Laterality: Left;  . CATARACT EXTRACTION W/PHACO Left 03/28/2016   Procedure: CATARACT EXTRACTION PHACO AND INTRAOCULAR LENS PLACEMENT LEFT EYE;  Surgeon: [name], MD;  Location: [facility];  Service: Ophthalmology;  Laterality: Left;  CDE: 4.77  . CATARACT EXTRACTION W/PHACO Right 04/11/2016   Procedure: CATARACT EXTRACTION PHACO AND INTRAOCULAR LENS  PLACEMENT RIGHT EYE; CDE:  4.74;  Surgeon: [name], MD;  Location: [facility];  Service: Ophthalmology;  Laterality: Right;  . COLONOSCOPY  10/2008   Fields: Rectal polyp obliterated, not retrieved, hemorrhoids, single ascending colon diverticulum near the CV. Next colonoscopy April 2020  . COLONOSCOPY N/A 12/25/2014   SLF: 1. Colorectal polyps (2) removed 2. Small internal hemorrhoids 3. the left colon is severely redundant  . DOPPLER ECHOCARDIOGRAPHY    . ESOPHAGOGASTRODUODENOSCOPY N/A 12/25/2014   SLF: 1. Anemia most likely due to CRI, gastritis, gastric polyps 2. Moderate non-erosive gastriits and mild duodenitis.  3.TWo large gstric polyps removed.   Marland Kitchen EYE SURGERY  12/22/2010   tear duct probing-[place]  . FOREIGN BODY REMOVAL  03/29/2011   Procedure: REMOVAL FOREIGN BODY EXTREMITY;  Surgeon: Arther Abbott, MD;  Location: [facility];  Service: Orthopedics;  Laterality: Right;  Removal Foreign Body Right Thumb  . KNEE ARTHROSCOPY  10/2007   left  . KNEE ARTHROSCOPY WITH LATERAL MENISECTOMY Right 10/14/2015   Procedure: LEFT KNEE ARTHROSCOPY WITH PARTIAL LATERAL MENISECTOMY;  Surgeon: Carole Civil, MD;  Location: [facility];  Service: Orthopedics;  Laterality: Right;  . LEFT HEART CATHETERIZATION WITH CORONARY ANGIOGRAM N/A 03/28/2013   Procedure:  LEFT HEART CATHETERIZATION WITH CORONARY ANGIOGRAM;  Surgeon: Leonie Man, MD;  Location: [facility];  Service: Cardiovascular;  Laterality: N/A;  . NM MYOVIEW LTD    . PENILE PROSTHESIS IMPLANT N/A 08/16/2015   Procedure: PENILE PROTHESIS INFLATABLE, three piece, Excisional biopsy of Penile ulcer, Penile molding;  Surgeon: Carolan Clines, MD;  Location: [facility];  Service: Urology;  Laterality: N/A;  . PENILE PROSTHESIS IMPLANT N/A 12/24/2017   Procedure: REMOVAL AND  REPLACEMENT  COLOPLAST PENILE PROSTHESIS;  Surgeon: Lucas Mallow, MD;  Location: [facility];  Service: Urology;  Laterality: N/A;  . QUADRICEPS TENDON REPAIR  07/21/2011    Procedure: REPAIR QUADRICEP TENDON;  Surgeon: Arther Abbott, MD;  Location: [facility];  Service: Orthopedics;  Laterality: Right;  . TOENAIL EXCISION     removed [id number]  . UMBILICAL HERNIA REPAIR  2007   [place]   Social History   Socioeconomic History  . Marital status: Married    Spouse name: Not on file  . Number of children: 2  . Years of education: 12th grade  . Highest education level: Not on file  Occupational History  . Occupation: disabled  . Occupation:      Employer: UNEMPLOYED  Social Needs  . Financial resource strain: Not on file  . Food insecurity:    Worry: Not on file    Inability: Not on file  . Transportation needs:    Medical: Not on file    Non-medical: Not on file  Tobacco Use  . Smoking status: Former Smoker    Packs/day: 1.00    Years: 25.00    Pack years: 25.00    Types: Cigarettes    Last attempt to quit: 03/27/2010    Years since quitting: 7.9  . Smokeless tobacco: Never Used  . Tobacco comment: Quit x 7 years  Substance and Sexual Activity  . Alcohol use: Yes    Alcohol/week: 0.0 standard drinks    Comment: occasionally  . Drug use: Yes    Types: Marijuana    Comment: cocaine- last time used- 11/24/2017 , marijuana-   . Sexual activity: Not on file  Lifestyle  . Physical activity:    Days per week: Not on file    Minutes per session: Not on file  . Stress: Not on file  Relationships  . Social connections:    Talks on phone: Not on file    Gets together: Not on file    Attends religious service: Not on file    Active member of club or organization: Not on file    Attends meetings of clubs or organizations: Not on file    Relationship status: Not on file  Other Topics Concern  . Not on file  Social History Narrative   He quit smoking in 2010. 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 He developed pulmonary problems, became disabled because of lower airway disease in 2009.       WATCHES  BASKETBALL. HIS TEAM IS [place].   Outpatient Encounter Medications as of 03/07/2018  Medication Sig  . acetaminophen (TYLENOL) 650 MG CR tablet Take 1,300 mg by mouth daily as needed for pain.  Marland Kitchen allopurinol (ZYLOPRIM) 100 MG tablet Take 100 mg by mouth daily.  Marland Kitchen amLODipine (NORVASC) 10 MG tablet Take 10 mg by mouth daily.   Marland Kitchen aspirin EC 81 MG tablet Take 1 tablet (81 mg total) by mouth daily.  . budesonide-formoterol (SYMBICORT) 160-4.5 MCG/ACT inhaler  Inhale 2 puffs into the lungs 2 (two) times daily as needed (Shortness of Breath).   . calcitRIOL (ROCALTROL) 0.25 MCG capsule Take 0.25 mcg by mouth daily.  . cetirizine (ZYRTEC) 10 MG tablet Take 10 mg by mouth at bedtime as needed for allergies.   Marland Kitchen colchicine 0.6 MG tablet Take 0.5 tablets (0.3 mg total) by mouth daily.  . diclofenac sodium (VOLTAREN) 1 % GEL Apply 1 application topically 2 (two) times daily as needed (muscle pain).  Marland Kitchen epoetin alfa (EPOGEN,PROCRIT) 4000 UNIT/ML injection Inject 4,000 Units into the vein every 21 ( twenty-one) days.  . hydrALAZINE (APRESOLINE) 25 MG tablet Take 25 mg by mouth 3 (three) times daily.  Marland Kitchen HYDROcodone-acetaminophen (NORCO/VICODIN) 5-325 MG tablet Take 1 tablet by mouth every 4 (four) hours as needed. (Patient not taking: Reported on 02/28/2018)  . Insulin Glargine (LANTUS SOLOSTAR) 100 UNIT/ML Solostar Pen INJECT 30 UNITS SUBCUTANEOUSLY AT BEDTIME.  . Insulin Pen Needle (PEN NEEDLES 3/16") 31G X 5 MM MISC Use as directed qhs  . mometasone (NASONEX) 50 MCG/ACT nasal spray Place 2 sprays into the nose 2 (two) times daily as needed (allergies).   . pantoprazole (PROTONIX) 40 MG tablet TAKE 1 TABLET TWICE DAILY 30 MINUTES PRIOR TO MEALS (Patient taking differently: daily. TAKE 1 TABLET TWICE DAILY 30 MINUTES PRIOR TO MEALS)  . simvastatin (ZOCOR) 20 MG tablet Take 20 mg by mouth every morning.   . TRADJENTA 5 MG TABS tablet TAKE 1 TABLET EVERY DAY  . [DISCONTINUED] HYDROcodone-acetaminophen  (NORCO/VICODIN) 5-325 MG tablet Take 1 tablet by mouth every 4 (four) hours as needed for moderate pain. (Patient not taking: Reported on 02/28/2018)  . [DISCONTINUED] predniSONE (DELTASONE) 20 MG tablet Take 2 po QD x 4d then 1 po QD x 4d (Patient not taking: Reported on 02/28/2018)  . [DISCONTINUED] predniSONE (DELTASONE) 20 MG tablet Take 20 mg by mouth daily. x5 days   No facility-administered encounter medications on file as of 03/07/2018.    ALLERGIES: Allergies  Allergen Reactions  . Opana [Oxymorphone Hcl] Itching  . Tramadol Itching   VACCINATION STATUS: Immunization History  Administered Date(s) Administered  . Td 03/27/2011    Diabetes  He presents for his follow-up diabetic visit. He has type 2 diabetes mellitus. Onset time: He was diagnosed approximately 20 years ago. His disease course has been stable. There are no hypoglycemic associated symptoms. Pertinent negatives for hypoglycemia include no confusion, headaches, pallor or seizures. Associated symptoms include polyuria. Pertinent negatives for diabetes include no chest pain, no fatigue, no polydipsia, no polyphagia and no weakness. There are no hypoglycemic complications. Symptoms are stable. Diabetic complications include nephropathy. Risk factors for coronary artery disease include dyslipidemia, diabetes mellitus, hypertension, obesity, sedentary lifestyle, tobacco exposure and male sex. Current diabetic treatment includes insulin injections and oral agent (monotherapy). He is compliant with treatment most of the time. His weight is stable. He is following a generally unhealthy diet. He has had a previous visit with a dietitian. He rarely participates in exercise. His breakfast blood glucose range is generally 110-130 mg/dl. His overall blood glucose range is 110-130 mg/dl. Eye exam is current.  Hypertension  This is a chronic problem. The current episode started more than 1 year ago. The problem is controlled. Pertinent  negatives include no chest pain, headaches, neck pain, palpitations or shortness of breath. Risk factors for coronary artery disease include diabetes mellitus, dyslipidemia, obesity, smoking/tobacco exposure, sedentary lifestyle and male gender. Past treatments include angiotensin blockers. Hypertensive end-organ damage includes kidney  disease.  Hyperlipidemia  This is a chronic problem. The current episode started more than 1 year ago. Exacerbating diseases include diabetes and obesity. Pertinent negatives include no chest pain, myalgias or shortness of breath. Current antihyperlipidemic treatment includes statins. Risk factors for coronary artery disease include diabetes mellitus, dyslipidemia, hypertension, male sex and a sedentary lifestyle.     Review of Systems  Constitutional: Negative for fatigue and unexpected weight change.  HENT: Negative for dental problem, mouth sores and trouble swallowing.   Eyes: Negative for visual disturbance.  Respiratory: Negative for cough, choking, chest tightness, shortness of breath and wheezing.   Cardiovascular: Negative for chest pain, palpitations and leg swelling.  Gastrointestinal: Negative for abdominal distention, abdominal pain, constipation, diarrhea, nausea and vomiting.  Endocrine: Positive for polyuria. Negative for polydipsia and polyphagia.  Genitourinary: Negative for dysuria, flank pain, hematuria and urgency.  Musculoskeletal: Negative for back pain, gait problem, myalgias and neck pain.  Skin: Negative for pallor, rash and wound.  Neurological: Negative for seizures, syncope, weakness, numbness and headaches.  Psychiatric/Behavioral: Negative for confusion and dysphoric mood.    Objective:    BP 119/70   Pulse 79   Ht 5\' 4"  (1.626 m)   Wt 230 lb (104.3 kg)   BMI 39.48 kg/m   Wt Readings from Last 3 Encounters:  03/07/18 230 lb (104.3 kg)  02/28/18 245 lb 3.2 oz (111.2 kg)  12/09/17 237 lb (107.5 kg)    Physical Exam   Constitutional: He is oriented to person, place, and time. He appears well-developed. He is cooperative. No distress.  HENT:  Head: Normocephalic and atraumatic.  Eyes: EOM are normal.  Neck: Normal range of motion. Neck supple. No tracheal deviation present. No thyromegaly present.  Cardiovascular: Normal rate, S1 normal and S2 normal. Exam reveals no gallop.  No murmur heard. Pulses:      Dorsalis pedis pulses are 1+ on the right side, and 1+ on the left side.       Posterior tibial pulses are 1+ on the right side, and 1+ on the left side.  Pulmonary/Chest: Effort normal. No respiratory distress. He has no wheezes.  Abdominal: He exhibits no distension. There is no tenderness. There is no guarding and no CVA tenderness.  Musculoskeletal: He exhibits no edema.       Right shoulder: He exhibits no swelling and no deformity.  Neurological: He is alert and oriented to person, place, and time. He has normal strength. No cranial nerve deficit or sensory deficit. Gait normal.  Skin: Skin is warm and dry. No rash noted. No cyanosis. Nails show no clubbing.  Psychiatric: He has a normal mood and affect. His speech is normal. Judgment normal. Cognition and memory are normal.    Results for orders placed or performed during the hospital encounter of 03/04/18  Hemoglobin-hemacue, POC  Result Value Ref Range   Hemoglobin 9.7 (L) 13.0 - 17.0 g/dL   Diabetic Labs (most recent): Lab Results  Component Value Date   HGBA1C 6.5 (H) 03/04/2018   HGBA1C 6.2 (H) 10/23/2017   HGBA1C 6.1 (H) 06/26/2017   Lipid Panel     Component Value Date/Time   CHOL 110 06/26/2017 1106   TRIG 101 06/26/2017 1106   HDL 38 (L) 06/26/2017 1106   CHOLHDL 2.9 06/26/2017 1106   VLDL 38 (H) 06/16/2016 0956   LDLCALC 54 06/26/2017 1106     Assessment & Plan:   1. Type 2 diabetes mellitus with stage 4 chronic kidney disease, with long-term  current use of insulin ([place])  -His  Diabetes is complicated by end-stage  renal disease status post fistula placement for hemodialysis,  and patient remains at a high risk for more acute and chronic complications of diabetes which include CAD, CVA, CKD, retinopathy, and neuropathy. These are all discussed in detail with the patient.  -He returns with A1c of 6.5%, generally stable and improving from 7.4%.     Recent labs reviewed. - I have re-counseled the patient on diet management and weight loss  by adopting a carbohydrate restricted / protein rich  Diet.  -  Suggestion is made for him to avoid simple carbohydrates  from his diet including Cakes, Sweet Desserts / Pastries, Ice Cream, Soda (diet and regular), Sweet Tea, Candies, Chips, Cookies, Store Bought Juices, Alcohol in Excess of  1-2 drinks a day, Artificial Sweeteners, and "Sugar-free" Products. This will help patient to have stable blood glucose profile and potentially avoid unintended weight gain.   - Patient is advised to stick to a routine mealtimes to eat 3 meals  a day and avoid unnecessary snacks ( to snack only to correct hypoglycemia). I have also advised him to cut back on alcohol consumption.   - I have approached patient with the following individualized plan to manage diabetes and patient agrees.   -He is advised to stay away from alcohol and smoking. -Based on his glycemic profile, he will continue to need at least basal insulin therapy.  -I advised him to continue Lantus 30 units nightly associated with monitoring of blood glucose at least 2 times daily-daily before breakfast and at bedtime.      - I advised him to stay off of Humalog for now .  He will continue Tradjenta 5 mg p.o. daily with breakfast.    He is following with Jearld Fenton, CDE for DM education.   -He will continue to follow-up with nephrology  given stage 4-5 CKD, he has a dialysis fistula in place.    -Patient is not a candidate for metformin and SGLT2 inhibitors due to CKD.  - Patient specific target  for A1c; LDL,  HDL, Triglycerides, and  Waist Circumference were discussed in detail.  2) BP/HTN: His blood pressure is controlled to target.  His blood pressure is controlled to target.   He is currently on amlodipine and hydralazine for blood pressure control.    3) Lipids/HPL: His recent lipid panel showed controlled LDL at 54.  He is advised to continue simvastatin 20 mg p.o. nightly.    4)  Weight/Diet: He continued to lose weight, lost 15 pounds over the last 6 months.  CDE consult in progress, exercise, and carbohydrates information provided.  5) Vitamin D deficiency - He is status intensive therapy with prescription vitamin D.  Is advised to continue with over-the-counter vitamin D3 2000 units daily.    6) Chronic Care/Health Maintenance:  -Patient is on ACEI/ARB and Statin medications and encouraged to continue to follow up with Ophthalmology, nephrology (he is status post fistula placement in prep for hemodialysis) Podiatrist at least yearly or according to recommendations, and advised to quit smoking. I have recommended yearly flu vaccine and pneumonia vaccination at least every 5 years; moderate intensity exercise for up to 150 minutes weekly; and  sleep for at least 7 hours a day. - I advised patient to maintain close follow up with his PCP for primary care needs.  - Time spent with the patient: 25 min, of which >50% was spent in reviewing his  blood glucose logs , discussing his hypo- and hyper-glycemic episodes, reviewing his current and  previous labs and insulin doses and developing a plan to avoid hypo- and hyper-glycemia. Please refer to Patient Instructions for Blood Glucose Monitoring and Insulin/Medications Dosing Guide"  in media tab for additional information. [name] [name] participated in the discussions, expressed understanding, and voiced agreement with the above plans.  All questions were answered to his satisfaction. he is encouraged to contact clinic should he have any questions or  concerns prior to his return visit.    Follow up plan: -Return in about 6 months (around 09/07/2018) for Follow up with Pre-visit Labs, Meter, and Logs.  Glade Lloyd, MD Phone: [phone]  Fax: [phone]   This note was partially dictated with voice recognition software. Similar sounding words can be transcribed inadequately or may not  be corrected upon review.  03/07/2018, 12:03 PM

## 2018-03-07 NOTE — Patient Instructions (Signed)

## 2018-03-19 ENCOUNTER — Encounter (HOSPITAL_COMMUNITY)
Admission: RE | Admit: 2018-03-19 | Discharge: 2018-03-19 | Disposition: A | Discharge status: Home / Self Care | Payer: Medicare Other | Source: Ambulatory Visit | Attending: Nephrology | Admitting: Nephrology

## 2018-03-19 ENCOUNTER — Encounter (HOSPITAL_COMMUNITY): Payer: Self-pay

## 2018-03-19 DIAGNOSIS — N185 Chronic kidney disease, stage 5: Secondary | ICD-10-CM | POA: Insufficient documentation

## 2018-03-19 DIAGNOSIS — D631 Anemia in chronic kidney disease: Secondary | ICD-10-CM | POA: Insufficient documentation

## 2018-03-19 LAB — POCT HEMOGLOBIN-HEMACUE: Hemoglobin: 9.7 g/dL — ABNORMAL LOW (ref 13.0–17.0)

## 2018-03-19 MED ORDER — EPOETIN ALFA 4000 UNIT/ML IJ SOLN
INTRAMUSCULAR | Status: AC
  Filled 2018-03-19: qty 1

## 2018-03-19 MED ORDER — EPOETIN ALFA 4000 UNIT/ML IJ SOLN
4000.0000 [IU] | Freq: Once | INTRAMUSCULAR | Status: AC
  Administered 2018-03-19: 4000 [IU] via SUBCUTANEOUS

## 2018-03-19 NOTE — Progress Notes (Signed)
Results for DMANI, MIZER (MRN [id number]) as of 03/19/2018 10:41  Ref. Range 03/19/2018 10:30  Hemoglobin Latest Ref Range: 13.0 - 17.0 g/dL 9.7 (L)

## 2018-03-20 ENCOUNTER — Other Ambulatory Visit: Payer: Self-pay | Admitting: Vascular Surgery

## 2018-03-20 ENCOUNTER — Other Ambulatory Visit: Payer: Self-pay | Admitting: "Endocrinology

## 2018-03-28 ENCOUNTER — Other Ambulatory Visit: Payer: Self-pay

## 2018-03-28 MED ORDER — INSULIN GLARGINE 100 UNIT/ML SOLOSTAR PEN: PEN_INJECTOR | SUBCUTANEOUS | 0 refills | Status: DC

## 2018-04-01 ENCOUNTER — Other Ambulatory Visit: Payer: Self-pay

## 2018-04-01 MED ORDER — INSULIN GLARGINE 100 UNIT/ML SOLOSTAR PEN: PEN_INJECTOR | SUBCUTANEOUS | 0 refills | Status: DC

## 2018-04-02 ENCOUNTER — Encounter (HOSPITAL_COMMUNITY): Payer: Self-pay

## 2018-04-02 ENCOUNTER — Encounter (HOSPITAL_COMMUNITY)
Admission: RE | Admit: 2018-04-02 | Discharge: 2018-04-02 | Disposition: A | Discharge status: Home / Self Care | Payer: Medicare Other | Source: Ambulatory Visit | Attending: Nephrology | Admitting: Nephrology

## 2018-04-02 DIAGNOSIS — N185 Chronic kidney disease, stage 5: Secondary | ICD-10-CM | POA: Diagnosis not present

## 2018-04-02 DIAGNOSIS — D631 Anemia in chronic kidney disease: Secondary | ICD-10-CM | POA: Diagnosis not present

## 2018-04-02 LAB — POCT HEMOGLOBIN-HEMACUE: HEMOGLOBIN: 10.1 g/dL — AB (ref 13.0–17.0)

## 2018-04-02 MED ORDER — EPOETIN ALFA 4000 UNIT/ML IJ SOLN: 4000.0000 [IU] | Freq: Once | INTRAMUSCULAR | Status: DC

## 2018-04-04 DIAGNOSIS — N184 Chronic kidney disease, stage 4 (severe): Secondary | ICD-10-CM | POA: Diagnosis not present

## 2018-04-04 DIAGNOSIS — I10 Essential (primary) hypertension: Secondary | ICD-10-CM | POA: Diagnosis not present

## 2018-04-04 DIAGNOSIS — E1121 Type 2 diabetes mellitus with diabetic nephropathy: Secondary | ICD-10-CM | POA: Diagnosis not present

## 2018-04-17 ENCOUNTER — Emergency Department (HOSPITAL_COMMUNITY)
Admission: EM | Admit: 2018-04-17 | Discharge: 2018-04-17 | Disposition: A | Discharge status: Home / Self Care | Payer: Medicare Other | Attending: Emergency Medicine | Admitting: Emergency Medicine

## 2018-04-17 ENCOUNTER — Emergency Department (HOSPITAL_COMMUNITY): Payer: Medicare Other

## 2018-04-17 ENCOUNTER — Encounter (HOSPITAL_COMMUNITY): Payer: Self-pay

## 2018-04-17 ENCOUNTER — Encounter (HOSPITAL_COMMUNITY)
Admission: RE | Admit: 2018-04-17 | Discharge: 2018-04-17 | Disposition: A | Discharge status: Home / Self Care | Payer: Medicare Other | Source: Ambulatory Visit | Attending: Nephrology | Admitting: Nephrology

## 2018-04-17 ENCOUNTER — Encounter (HOSPITAL_COMMUNITY): Payer: Self-pay | Admitting: Emergency Medicine

## 2018-04-17 ENCOUNTER — Other Ambulatory Visit: Payer: Self-pay

## 2018-04-17 DIAGNOSIS — M25572 Pain in left ankle and joints of left foot: Secondary | ICD-10-CM

## 2018-04-17 DIAGNOSIS — D631 Anemia in chronic kidney disease: Secondary | ICD-10-CM | POA: Insufficient documentation

## 2018-04-17 DIAGNOSIS — J449 Chronic obstructive pulmonary disease, unspecified: Secondary | ICD-10-CM | POA: Insufficient documentation

## 2018-04-17 DIAGNOSIS — E1122 Type 2 diabetes mellitus with diabetic chronic kidney disease: Secondary | ICD-10-CM | POA: Insufficient documentation

## 2018-04-17 DIAGNOSIS — Z87891 Personal history of nicotine dependence: Secondary | ICD-10-CM | POA: Diagnosis not present

## 2018-04-17 DIAGNOSIS — I129 Hypertensive chronic kidney disease with stage 1 through stage 4 chronic kidney disease, or unspecified chronic kidney disease: Secondary | ICD-10-CM | POA: Insufficient documentation

## 2018-04-17 DIAGNOSIS — M25472 Effusion, left ankle: Secondary | ICD-10-CM | POA: Diagnosis not present

## 2018-04-17 DIAGNOSIS — M79605 Pain in left leg: Secondary | ICD-10-CM | POA: Insufficient documentation

## 2018-04-17 DIAGNOSIS — N185 Chronic kidney disease, stage 5: Secondary | ICD-10-CM | POA: Diagnosis not present

## 2018-04-17 DIAGNOSIS — I2510 Atherosclerotic heart disease of native coronary artery without angina pectoris: Secondary | ICD-10-CM | POA: Insufficient documentation

## 2018-04-17 DIAGNOSIS — N184 Chronic kidney disease, stage 4 (severe): Secondary | ICD-10-CM | POA: Diagnosis not present

## 2018-04-17 DIAGNOSIS — N189 Chronic kidney disease, unspecified: Secondary | ICD-10-CM

## 2018-04-17 LAB — BASIC METABOLIC PANEL
ANION GAP: 12 (ref 5–15)
BUN: 57 mg/dL — ABNORMAL HIGH (ref 8–23)
CO2: 24 mmol/L (ref 22–32)
Calcium: 9.1 mg/dL (ref 8.9–10.3)
Chloride: 103 mmol/L (ref 98–111)
Creatinine, Ser: 8.1 mg/dL — ABNORMAL HIGH (ref 0.61–1.24)
GFR, EST AFRICAN AMERICAN: 7 mL/min — AB (ref >60)
GFR, EST NON AFRICAN AMERICAN: 6 mL/min — AB (ref >60)
Glucose, Bld: 119 mg/dL — ABNORMAL HIGH (ref 70–99)
POTASSIUM: 4.5 mmol/L (ref 3.5–5.1)
SODIUM: 139 mmol/L (ref 135–145)

## 2018-04-17 LAB — CBC WITH DIFFERENTIAL/PLATELET
BASOS ABS: 0 10*3/uL (ref 0.0–0.1)
BASOS PCT: 0 %
EOS ABS: 0.2 10*3/uL (ref 0.0–0.7)
EOS PCT: 3 %
HCT: 33.7 % — ABNORMAL LOW (ref 39.0–52.0)
Hemoglobin: 10.6 g/dL — ABNORMAL LOW (ref 13.0–17.0)
Lymphocytes Relative: 14 %
Lymphs Abs: 1 10*3/uL (ref 0.7–4.0)
MCH: 25.4 pg — ABNORMAL LOW (ref 26.0–34.0)
MCHC: 31.5 g/dL (ref 30.0–36.0)
MCV: 80.6 fL (ref 78.0–100.0)
Monocytes Absolute: 0.4 10*3/uL (ref 0.1–1.0)
Monocytes Relative: 6 %
Neutro Abs: 5.5 10*3/uL (ref 1.7–7.7)
Neutrophils Relative %: 77 %
PLATELETS: 162 10*3/uL (ref 150–400)
RBC: 4.18 MIL/uL — AB (ref 4.22–5.81)
RDW: 15.5 % (ref 11.5–15.5)
WBC: 7.1 10*3/uL (ref 4.0–10.5)

## 2018-04-17 LAB — POCT HEMOGLOBIN-HEMACUE: HEMOGLOBIN: 10.5 g/dL — AB (ref 13.0–17.0)

## 2018-04-17 MED ORDER — PREDNISONE 10 MG (21) PO TBPK: ORAL_TABLET | Freq: Every day | ORAL | 0 refills | Status: DC

## 2018-04-17 MED ORDER — HYDROCODONE-ACETAMINOPHEN 5-325 MG PO TABS
1.0000 | ORAL_TABLET | Freq: Once | ORAL | Status: AC
  Administered 2018-04-17: 1 via ORAL
  Filled 2018-04-17: qty 1

## 2018-04-17 MED ORDER — EPOETIN ALFA 4000 UNIT/ML IJ SOLN: 4000.0000 [IU] | Freq: Once | INTRAMUSCULAR | Status: DC

## 2018-04-17 MED ORDER — IBUPROFEN 800 MG PO TABS: 800.0000 mg | ORAL_TABLET | Freq: Once | ORAL | Status: DC

## 2018-04-17 MED ORDER — HYDROCODONE-ACETAMINOPHEN 5-325 MG PO TABS: 1.0000 | ORAL_TABLET | Freq: Four times a day (QID) | ORAL | 0 refills | Status: DC | PRN

## 2018-04-17 NOTE — ED Provider Notes (Signed)
Emergency Department Provider Note   I have reviewed the triage vital signs and the nursing notes.   HISTORY  Chief Complaint Leg Pain   HPI [name] [name] is a 67 y.o. male with PMH of CAD, gout, chronic knee pain, CKD, DM, HLD, and HTN presents to the emergency department for evaluation of left ankle pain radiating up the left calf to the knee.  Patient has had pain in this area for the past 5 days.  He has been on allopurinol and colchicine the last 6 months.  He has had gout in the left ankle before and states this feels similar but his medication is not working for him.  He has not tried anything stronger for pain.  He is not currently on steroids.  Denies any fevers or shaking chills.  No chest pain or shortness of breath.  Pain is worse with movement of the ankle or walking. Denies injury.   Past Medical History:  Diagnosis Date  . Anemia   . Arthritis   . Bell palsy   . CAD (coronary artery disease)    a. 2014 MV: abnl w/ infap ischemia; b. 03/2013 Cath: aneurysmal bleb in the LAD w/ otw nonobs dzs-->Med Rx.  . Chronic back pain   . Chronic knee pain    a. 09/2015 s/p R TKA.  Marland Kitchen Chronic pain   . Chronic shoulder pain   . Chronic sinusitis   . CKD (chronic kidney disease), stage IV ([place])    stage 4 per office visit note of Dr Lowanda Foster on 05/2015   . COPD (chronic obstructive pulmonary disease) ([place])   . Diabetes mellitus without complication ([place])    type II   . Essential hypertension   . GERD (gastroesophageal reflux disease)   . Gout   . Gout   . Hepatomegaly    noted on noncontrast CT 2015  . History of hiatal hernia   . Hyperlipidemia   . Lateral meniscus tear   . Obesity    [place]  . Obstructive sleep apnea    does not use cpap   . On home oxygen therapy    uses 2l when is going somewhere per patient   . PUD (peptic ulcer disease)    remote, reports f/u EGD about 8 years ago unremarkable   . Reactive airway disease    related to exposure to chemical  during 9/11  . Sinusitis   . Vitamin D deficiency     Patient Active Problem List   Diagnosis Date Noted  . Constipation 02/28/2018  . End stage renal disease ([place]) 11/13/2017  . Foul smelling urine 12/14/2016  . Coronary artery disease involving native coronary artery of native heart without angina pectoris 10/27/2016  . Chronic kidney disease, stage IV (severe) ([place]) 10/27/2016  . Morbid obesity due to excess calories ([place]) 03/22/2016  . CAD (coronary artery disease)   . Erectile dysfunction 08/16/2015  . Hemorrhoids 07/05/2015  . Vitamin D deficiency 06/04/2015  . OSA on CPAP 05/14/2015  . Normocytic anemia 03/24/2015  . Fatty liver 12/01/2014  . Back pain 06/05/2013  . OA (osteoarthritis) of knee 03/15/2011  . CTS (carpal tunnel syndrome) 03/15/2011  . Diabetes mellitus with stage 5 chronic kidney disease ([place]) 09/29/2008  . Mixed hyperlipidemia 09/29/2008  . Essential hypertension 09/29/2008  . GERD 09/29/2008    Past Surgical History:  Procedure Laterality Date  . ASAD LT SHOULDER  12/2008   left shoulder  . AV FISTULA PLACEMENT Left 08/09/2016  Procedure: BRACHIOCEPHALIC ARTERIOVENOUS (AV) FISTULA CREATION LEFT ARM;  Surgeon: [name], MD;  Location: [facility];  Service: Vascular;  Laterality: Left;  . CATARACT EXTRACTION W/PHACO Left 03/28/2016   Procedure: CATARACT EXTRACTION PHACO AND INTRAOCULAR LENS PLACEMENT LEFT EYE;  Surgeon: [name], MD;  Location: [facility];  Service: Ophthalmology;  Laterality: Left;  CDE: 4.77  . CATARACT EXTRACTION W/PHACO Right 04/11/2016   Procedure: CATARACT EXTRACTION PHACO AND INTRAOCULAR LENS PLACEMENT RIGHT EYE; CDE:  4.74;  Surgeon: [name], MD;  Location: [facility];  Service: Ophthalmology;  Laterality: Right;  . COLONOSCOPY  10/2008   Fields: Rectal polyp obliterated, not retrieved, hemorrhoids, single ascending colon diverticulum near the CV. Next colonoscopy April 2020  . COLONOSCOPY N/A 12/25/2014   SLF: 1. Colorectal  polyps (2) removed 2. Small internal hemorrhoids 3. the left colon is severely redundant  . DOPPLER ECHOCARDIOGRAPHY    . ESOPHAGOGASTRODUODENOSCOPY N/A 12/25/2014   SLF: 1. Anemia most likely due to CRI, gastritis, gastric polyps 2. Moderate non-erosive gastriits and mild duodenitis.  3.TWo large gstric polyps removed.   Marland Kitchen EYE SURGERY  12/22/2010   tear duct probing-[place]  . FOREIGN BODY REMOVAL  03/29/2011   Procedure: REMOVAL FOREIGN BODY EXTREMITY;  Surgeon: Arther Abbott, MD;  Location: [facility];  Service: Orthopedics;  Laterality: Right;  Removal Foreign Body Right Thumb  . KNEE ARTHROSCOPY  10/2007   left  . KNEE ARTHROSCOPY WITH LATERAL MENISECTOMY Right 10/14/2015   Procedure: LEFT KNEE ARTHROSCOPY WITH PARTIAL LATERAL MENISECTOMY;  Surgeon: Carole Civil, MD;  Location: [facility];  Service: Orthopedics;  Laterality: Right;  . LEFT HEART CATHETERIZATION WITH CORONARY ANGIOGRAM N/A 03/28/2013   Procedure: LEFT HEART CATHETERIZATION WITH CORONARY ANGIOGRAM;  Surgeon: Leonie Man, MD;  Location: [facility];  Service: Cardiovascular;  Laterality: N/A;  . NM MYOVIEW LTD    . PENILE PROSTHESIS IMPLANT N/A 08/16/2015   Procedure: PENILE PROTHESIS INFLATABLE, three piece, Excisional biopsy of Penile ulcer, Penile molding;  Surgeon: Carolan Clines, MD;  Location: [facility];  Service: Urology;  Laterality: N/A;  . PENILE PROSTHESIS IMPLANT N/A 12/24/2017   Procedure: REMOVAL AND  REPLACEMENT  COLOPLAST PENILE PROSTHESIS;  Surgeon: Lucas Mallow, MD;  Location: [facility];  Service: Urology;  Laterality: N/A;  . QUADRICEPS TENDON REPAIR  07/21/2011   Procedure: REPAIR QUADRICEP TENDON;  Surgeon: Arther Abbott, MD;  Location: [facility];  Service: Orthopedics;  Laterality: Right;  . TOENAIL EXCISION     removed [id number]  . UMBILICAL HERNIA REPAIR  2007   [place]   Allergies Opana [oxymorphone hcl] and Tramadol  Family History  Problem Relation Age of Onset  . Hypertension Mother         MI  . Cancer Mother        breast   . Diabetes Mother   . Diabetes Father   . Hypertension Father   . Hypertension Sister   . Diabetes Sister   . Arthritis Unknown   . Asthma Unknown   . Lung disease Unknown   . Anesthesia problems Neg Hx   . Hypotension Neg Hx   . Malignant hyperthermia Neg Hx   . Pseudochol deficiency Neg Hx   . Colon cancer Neg Hx     Social History Social History   Tobacco Use  . Smoking status: Former Smoker    Packs/day: 1.00    Years: 25.00    Pack years: 25.00    Types: Cigarettes    Last attempt  to quit: 03/27/2010    Years since quitting: 8.0  . Smokeless tobacco: Never Used  . Tobacco comment: Quit x 7 years  Substance Use Topics  . Alcohol use: Yes    Alcohol/week: 0.0 standard drinks    Comment: occasionally  . Drug use: Yes    Types: Marijuana    Comment: cocaine- last time used- 11/24/2017 , marijuana-     Review of Systems  Constitutional: No fever/chills Eyes: No visual changes. ENT: No sore throat. Cardiovascular: Denies chest pain. Respiratory: Denies shortness of breath. Gastrointestinal: No abdominal pain.  No nausea, no vomiting.  No diarrhea.  No constipation. Genitourinary: Negative for dysuria. Musculoskeletal: Negative for back pain. Positive left ankle and leg pain.  Skin: Negative for rash. Neurological: Negative for headaches, focal weakness or numbness.  10-point ROS otherwise negative.  ____________________________________________   PHYSICAL EXAM:  VITAL SIGNS: ED Triage Vitals [04/17/18 1054]  Enc Vitals Group     BP 127/75     Pulse Rate (!) 101     Resp 16     Temp 98.6 F (37 C)     Temp Source Oral     SpO2 97 %     Weight 240 lb (108.9 kg)     Height 5\' 4"  (1.626 m)     Pain Score 8   Constitutional: Alert and oriented. Well appearing and in no acute distress. Eyes: Conjunctivae are normal.  Head: Atraumatic. Nose: No congestion/rhinnorhea. Mouth/Throat: Mucous membranes are moist.   Oropharynx non-erythematous. Neck: No stridor.  Cardiovascular: Normal rate, regular rhythm. Good peripheral circulation. Grossly normal heart sounds.   Respiratory: Normal respiratory effort.  No retractions. Lungs CTAB. Gastrointestinal: Soft and nontender. No distention.  Musculoskeletal: Positive left ankle swelling without warmth or cellulitis. Pain with passive ROM of the left ankle and tenderness up into the left calf.  Neurologic:  Normal speech and language. No gross focal neurologic deficits are appreciated.  Skin:  Skin is warm, dry and intact. No rash noted.   ____________________________________________   LABS (all labs ordered are listed, but only abnormal results are displayed)  Labs Reviewed  CBC WITH DIFFERENTIAL/PLATELET - Abnormal; Notable for the following components:      Result Value   RBC 4.18 (*)    Hemoglobin 10.6 (*)    HCT 33.7 (*)    MCH 25.4 (*)    All other components within normal limits  BASIC METABOLIC PANEL - Abnormal; Notable for the following components:   Glucose, Bld 119 (*)    BUN 57 (*)    Creatinine, Ser 8.10 (*)    GFR calc non Af Amer 6 (*)    GFR calc Af Amer 7 (*)    All other components within normal limits   ____________________________________________  RADIOLOGY  Dg Ankle Complete Left  Result Date: 04/17/2018 CLINICAL DATA:  Pain and swelling EXAM: LEFT ANKLE COMPLETE - 3+ VIEW COMPARISON:  October 09, 2017 FINDINGS: Frontal, oblique, and lateral views obtained. There is no evident fracture. There is soft tissue swelling with small joint effusion. There is spurring along the medial malleolus, stable. There is a posterior calcaneal spur. There is no appreciable joint space narrowing or erosion. The ankle mortise appears intact. IMPRESSION: Mild soft tissue swelling with small joint effusion. If patient has had recent trauma, these findings could be indicative of a degree of ligamentous injury. There is mild osteoarthritic change  medially as well as a spur arising from the posterior calcaneus. No joint space narrowing  or erosion. Ankle mortise appears intact. Electronically Signed   By: Lowella Grip III M.D.   On: 04/17/2018 11:42   US Venous Img Lower  Left (dvt Study)  Result Date: 04/17/2018 CLINICAL DATA:  LEFT leg pain EXAM: LEFT LOWER EXTREMITY VENOUS DOPPLER ULTRASOUND TECHNIQUE: Gray-scale sonography with graded compression, as well as color Doppler and duplex ultrasound were performed to evaluate the lower extremity deep venous systems from the level of the common femoral vein and including the common femoral, femoral, profunda femoral, popliteal and calf veins including the posterior tibial, peroneal and gastrocnemius veins when visible. The superficial great saphenous vein was also interrogated. Spectral Doppler was utilized to evaluate flow at rest and with distal augmentation maneuvers in the common femoral, femoral and popliteal veins. COMPARISON:  None FINDINGS: Contralateral Common Femoral Vein: Respiratory phasicity is normal and symmetric with the symptomatic side. No evidence of thrombus. Normal compressibility. Common Femoral Vein: No evidence of thrombus. Normal compressibility, respiratory phasicity and response to augmentation. Saphenofemoral Junction: No evidence of thrombus. Normal compressibility and flow on color Doppler imaging. Profunda Femoral Vein: No evidence of thrombus. Normal compressibility and flow on color Doppler imaging. Femoral Vein: No evidence of thrombus. Normal compressibility, respiratory phasicity and response to augmentation. Popliteal Vein: No evidence of thrombus. Normal compressibility, respiratory phasicity and response to augmentation. Calf Veins: No evidence of thrombus. Normal compressibility and flow on color Doppler imaging. Superficial Great Saphenous Vein: No evidence of thrombus. Normal compressibility. Venous Reflux:  None. Other Findings:  None. IMPRESSION: No evidence of  deep venous thrombosis in the LEFT lower extremity. Electronically Signed   By: Lavonia Dana M.D.   On: 04/17/2018 12:05    ____________________________________________   PROCEDURES  Procedure(s) performed:   Procedures  None ____________________________________________   INITIAL IMPRESSION / ASSESSMENT AND PLAN / ED COURSE  Pertinent labs & imaging results that were available during my care of the patient were reviewed by me and considered in my medical decision making (see chart for details).  She presents to the emergency department for evaluation of left ankle and calf pain.  Seems most consistent with gout.  The patient does have chronic kidney disease and is near dialysis.  He has been taking allopurinol for the past 6 months but states he is also been taking colchicine every day even without acute gout symptoms.  Plan to obtain screening labs including CBC and chemistry.  Plan for plain film of the left ankle to evaluate for occult injury and obtain ultrasound of the left leg to r/o DVT.   Plain films and [place] negative for acute process. Patient with crutches at home. Will discontinue colchicine. CKD worse but patient prepping for HD. Has left AC fistula. No electrolyte abnormality or acute HD requirement. Plan for steroids and pain medication for home. Advised close PCP follow up.   At this time, I do not feel there is any life-threatening condition present. I have reviewed and discussed all results (EKG, imaging, lab, urine as appropriate), exam findings with patient. I have reviewed nursing notes and appropriate previous records.  I feel the patient is safe to be discharged home without further emergent workup. Discussed usual and customary return precautions. Patient and family (if present) verbalize understanding and are comfortable with this plan.  Patient will follow-up with their primary care provider. If they do not have a primary care provider, information for follow-up has  been provided to them. All questions have been answered.  ____________________________________________  FINAL CLINICAL IMPRESSION(S) /  ED DIAGNOSES  Final diagnoses:  Left leg pain  Acute left ankle pain  Chronic kidney disease, unspecified CKD stage     MEDICATIONS GIVEN DURING THIS VISIT:  Medications  HYDROcodone-acetaminophen (NORCO/VICODIN) 5-325 MG per tablet 1 tablet (1 tablet Oral Given 04/17/18 1253)     NEW OUTPATIENT MEDICATIONS STARTED DURING THIS VISIT:  New Prescriptions   HYDROCODONE-ACETAMINOPHEN (NORCO/VICODIN) 5-325 MG TABLET    Take 1 tablet by mouth every 6 (six) hours as needed for severe pain.   PREDNISONE (STERAPRED UNI-PAK 21 TAB) 10 MG (21) TBPK TABLET    Take by mouth daily. Take 6 tabs by mouth daily  for 2 days, then 5 tabs for 2 days, then 4 tabs for 2 days, then 3 tabs for 2 days, 2 tabs for 2 days, then 1 tab by mouth daily for 2 days    Note:  This document was prepared using Dragon voice recognition software and may include unintentional dictation errors.  Nanda Quinton, MD Emergency Medicine    Jabin Tapp, [facility], MD 04/17/18 [phone]

## 2018-04-17 NOTE — Discharge Instructions (Signed)
You were seen in the ED today with left leg pain. We do not see any fracture or blood clot in the leg. I am refilling your Hydrocodone to take as needed. You should STOP taking your Colchicine given your kidney function. You should START the Prednisone. This may increased your blood sugars temporarily if you are taking them regularly at home. Return to the ED with any worsening pain, fever, chills. Call your PCP and Nephrologist to discuss your worsening kidney function.

## 2018-04-17 NOTE — ED Triage Notes (Addendum)
Patient complaining of pain from left ankle to left knee x 4 days. Denies injury. States he has history of gout. States he is taking gout medication but it is not helping pain.

## 2018-04-23 DIAGNOSIS — E1142 Type 2 diabetes mellitus with diabetic polyneuropathy: Secondary | ICD-10-CM | POA: Diagnosis not present

## 2018-04-23 DIAGNOSIS — Z961 Presence of intraocular lens: Secondary | ICD-10-CM | POA: Diagnosis not present

## 2018-04-23 DIAGNOSIS — L851 Acquired keratosis [keratoderma] palmaris et plantaris: Secondary | ICD-10-CM | POA: Diagnosis not present

## 2018-04-23 DIAGNOSIS — B351 Tinea unguium: Secondary | ICD-10-CM | POA: Diagnosis not present

## 2018-04-23 DIAGNOSIS — E119 Type 2 diabetes mellitus without complications: Secondary | ICD-10-CM | POA: Diagnosis not present

## 2018-04-23 DIAGNOSIS — Z794 Long term (current) use of insulin: Secondary | ICD-10-CM | POA: Diagnosis not present

## 2018-04-24 DIAGNOSIS — I10 Essential (primary) hypertension: Secondary | ICD-10-CM | POA: Diagnosis not present

## 2018-04-24 DIAGNOSIS — N184 Chronic kidney disease, stage 4 (severe): Secondary | ICD-10-CM | POA: Diagnosis not present

## 2018-04-24 DIAGNOSIS — M109 Gout, unspecified: Secondary | ICD-10-CM | POA: Diagnosis not present

## 2018-04-24 DIAGNOSIS — D509 Iron deficiency anemia, unspecified: Secondary | ICD-10-CM | POA: Diagnosis not present

## 2018-04-24 DIAGNOSIS — D519 Vitamin B12 deficiency anemia, unspecified: Secondary | ICD-10-CM | POA: Diagnosis not present

## 2018-04-24 DIAGNOSIS — R809 Proteinuria, unspecified: Secondary | ICD-10-CM | POA: Diagnosis not present

## 2018-04-24 DIAGNOSIS — E1129 Type 2 diabetes mellitus with other diabetic kidney complication: Secondary | ICD-10-CM | POA: Diagnosis not present

## 2018-05-01 ENCOUNTER — Encounter (HOSPITAL_COMMUNITY): Payer: Self-pay

## 2018-05-01 ENCOUNTER — Encounter (HOSPITAL_COMMUNITY)
Admission: RE | Admit: 2018-05-01 | Discharge: 2018-05-01 | Disposition: A | Discharge status: Home / Self Care | Payer: Medicare Other | Source: Ambulatory Visit | Attending: Nephrology | Admitting: Nephrology

## 2018-05-01 DIAGNOSIS — D638 Anemia in other chronic diseases classified elsewhere: Secondary | ICD-10-CM | POA: Diagnosis not present

## 2018-05-01 DIAGNOSIS — N2581 Secondary hyperparathyroidism of renal origin: Secondary | ICD-10-CM | POA: Diagnosis not present

## 2018-05-01 DIAGNOSIS — N185 Chronic kidney disease, stage 5: Secondary | ICD-10-CM | POA: Diagnosis not present

## 2018-05-01 LAB — POCT HEMOGLOBIN-HEMACUE
Hemoglobin: 9.3 g/dL — ABNORMAL LOW (ref 13.0–17.0)
Hemoglobin: 9.4 g/dL — ABNORMAL LOW (ref 13.0–17.0)

## 2018-05-01 MED ORDER — EPOETIN ALFA 4000 UNIT/ML IJ SOLN
4000.0000 [IU] | Freq: Once | INTRAMUSCULAR | Status: AC
  Administered 2018-05-01: 4000 [IU] via SUBCUTANEOUS

## 2018-05-01 MED ORDER — EPOETIN ALFA 4000 UNIT/ML IJ SOLN
INTRAMUSCULAR | Status: AC
  Filled 2018-05-01: qty 1

## 2018-05-13 DIAGNOSIS — N185 Chronic kidney disease, stage 5: Secondary | ICD-10-CM | POA: Diagnosis not present

## 2018-05-13 DIAGNOSIS — Z1159 Encounter for screening for other viral diseases: Secondary | ICD-10-CM | POA: Diagnosis not present

## 2018-05-13 DIAGNOSIS — D519 Vitamin B12 deficiency anemia, unspecified: Secondary | ICD-10-CM | POA: Diagnosis not present

## 2018-05-13 DIAGNOSIS — Z79899 Other long term (current) drug therapy: Secondary | ICD-10-CM | POA: Diagnosis not present

## 2018-05-13 DIAGNOSIS — D649 Anemia, unspecified: Secondary | ICD-10-CM | POA: Diagnosis not present

## 2018-05-15 ENCOUNTER — Encounter (HOSPITAL_COMMUNITY)
Admission: RE | Admit: 2018-05-15 | Discharge: 2018-05-15 | Disposition: A | Discharge status: Home / Self Care | Payer: Medicare Other | Source: Ambulatory Visit | Attending: Nephrology | Admitting: Nephrology

## 2018-05-15 ENCOUNTER — Encounter (HOSPITAL_COMMUNITY): Payer: Self-pay

## 2018-05-15 DIAGNOSIS — N185 Chronic kidney disease, stage 5: Secondary | ICD-10-CM | POA: Diagnosis not present

## 2018-05-15 LAB — POCT HEMOGLOBIN-HEMACUE: Hemoglobin: 8.7 g/dL — ABNORMAL LOW (ref 13.0–17.0)

## 2018-05-15 MED ORDER — EPOETIN ALFA 4000 UNIT/ML IJ SOLN
INTRAMUSCULAR | Status: AC
  Filled 2018-05-15: qty 1

## 2018-05-15 MED ORDER — EPOETIN ALFA 4000 UNIT/ML IJ SOLN
4000.0000 [IU] | Freq: Once | INTRAMUSCULAR | Status: AC
  Administered 2018-05-15: 4000 [IU] via SUBCUTANEOUS

## 2018-05-16 ENCOUNTER — Other Ambulatory Visit: Payer: Self-pay | Admitting: "Endocrinology

## 2018-05-22 DIAGNOSIS — N185 Chronic kidney disease, stage 5: Secondary | ICD-10-CM | POA: Diagnosis not present

## 2018-05-22 DIAGNOSIS — D638 Anemia in other chronic diseases classified elsewhere: Secondary | ICD-10-CM | POA: Diagnosis not present

## 2018-05-22 DIAGNOSIS — E538 Deficiency of other specified B group vitamins: Secondary | ICD-10-CM | POA: Diagnosis not present

## 2018-05-29 ENCOUNTER — Encounter (HOSPITAL_COMMUNITY)
Admission: RE | Admit: 2018-05-29 | Discharge: 2018-05-29 | Disposition: A | Discharge status: Home / Self Care | Payer: Medicare Other | Source: Ambulatory Visit | Attending: Nephrology | Admitting: Nephrology

## 2018-05-29 DIAGNOSIS — N185 Chronic kidney disease, stage 5: Secondary | ICD-10-CM | POA: Insufficient documentation

## 2018-05-29 DIAGNOSIS — D631 Anemia in chronic kidney disease: Secondary | ICD-10-CM | POA: Diagnosis not present

## 2018-05-29 LAB — POCT HEMOGLOBIN-HEMACUE: Hemoglobin: 9.2 g/dL — ABNORMAL LOW (ref 13.0–17.0)

## 2018-05-29 MED ORDER — EPOETIN ALFA 4000 UNIT/ML IJ SOLN
4000.0000 [IU] | Freq: Once | INTRAMUSCULAR | Status: AC
  Administered 2018-05-29: 4000 [IU] via SUBCUTANEOUS
  Filled 2018-05-29: qty 1

## 2018-05-29 NOTE — Final Progress Note (Signed)
Results for [name] [name], [name] [name] (MRN [id number]) as of 05/29/2018 14:20  Ref. Range 05/29/2018 10:19  Hemoglobin Latest Ref Range: 13.0 - 17.0 g/dL 9.2 (L)

## 2018-06-12 ENCOUNTER — Encounter (HOSPITAL_COMMUNITY)
Admission: RE | Admit: 2018-06-12 | Discharge: 2018-06-12 | Disposition: A | Discharge status: Home / Self Care | Payer: Medicare Other | Source: Ambulatory Visit | Attending: Nephrology | Admitting: Nephrology

## 2018-06-12 ENCOUNTER — Encounter (HOSPITAL_COMMUNITY): Payer: Medicare Other

## 2018-06-18 DIAGNOSIS — D519 Vitamin B12 deficiency anemia, unspecified: Secondary | ICD-10-CM | POA: Diagnosis not present

## 2018-06-18 DIAGNOSIS — Z79899 Other long term (current) drug therapy: Secondary | ICD-10-CM | POA: Diagnosis not present

## 2018-06-18 DIAGNOSIS — Z1159 Encounter for screening for other viral diseases: Secondary | ICD-10-CM | POA: Diagnosis not present

## 2018-06-18 DIAGNOSIS — N185 Chronic kidney disease, stage 5: Secondary | ICD-10-CM | POA: Diagnosis not present

## 2018-06-18 DIAGNOSIS — D649 Anemia, unspecified: Secondary | ICD-10-CM | POA: Diagnosis not present

## 2018-06-21 ENCOUNTER — Other Ambulatory Visit (HOSPITAL_COMMUNITY): Payer: Self-pay

## 2018-06-26 ENCOUNTER — Encounter (HOSPITAL_COMMUNITY): Admission: RE | Admit: 2018-06-26 | Payer: Medicare Other | Source: Ambulatory Visit

## 2018-06-26 ENCOUNTER — Encounter (HOSPITAL_COMMUNITY): Payer: Medicare Other

## 2018-06-26 DIAGNOSIS — N2581 Secondary hyperparathyroidism of renal origin: Secondary | ICD-10-CM | POA: Diagnosis not present

## 2018-06-26 DIAGNOSIS — D638 Anemia in other chronic diseases classified elsewhere: Secondary | ICD-10-CM | POA: Diagnosis not present

## 2018-06-26 DIAGNOSIS — N185 Chronic kidney disease, stage 5: Secondary | ICD-10-CM | POA: Diagnosis not present

## 2018-06-28 ENCOUNTER — Encounter (HOSPITAL_COMMUNITY)
Admission: RE | Admit: 2018-06-28 | Discharge: 2018-06-28 | Disposition: A | Discharge status: Home / Self Care | Payer: Medicare Other | Source: Ambulatory Visit | Attending: Nephrology | Admitting: Nephrology

## 2018-06-28 ENCOUNTER — Encounter (HOSPITAL_COMMUNITY): Payer: Self-pay

## 2018-06-28 DIAGNOSIS — D631 Anemia in chronic kidney disease: Secondary | ICD-10-CM | POA: Insufficient documentation

## 2018-06-28 DIAGNOSIS — N185 Chronic kidney disease, stage 5: Secondary | ICD-10-CM | POA: Insufficient documentation

## 2018-06-28 LAB — POCT HEMOGLOBIN-HEMACUE: Hemoglobin: 8.4 g/dL — ABNORMAL LOW (ref 13.0–17.0)

## 2018-06-28 MED ORDER — EPOETIN ALFA 4000 UNIT/ML IJ SOLN
INTRAMUSCULAR | Status: AC
  Filled 2018-06-28: qty 1

## 2018-06-28 MED ORDER — EPOETIN ALFA 4000 UNIT/ML IJ SOLN
8000.0000 [IU] | Freq: Once | INTRAMUSCULAR | Status: AC
  Administered 2018-06-28: 8000 [IU] via SUBCUTANEOUS

## 2018-07-02 DIAGNOSIS — E1142 Type 2 diabetes mellitus with diabetic polyneuropathy: Secondary | ICD-10-CM | POA: Diagnosis not present

## 2018-07-02 DIAGNOSIS — B351 Tinea unguium: Secondary | ICD-10-CM | POA: Diagnosis not present

## 2018-07-02 DIAGNOSIS — L851 Acquired keratosis [keratoderma] palmaris et plantaris: Secondary | ICD-10-CM | POA: Diagnosis not present

## 2018-07-03 DIAGNOSIS — E1165 Type 2 diabetes mellitus with hyperglycemia: Secondary | ICD-10-CM | POA: Diagnosis not present

## 2018-07-03 DIAGNOSIS — N184 Chronic kidney disease, stage 4 (severe): Secondary | ICD-10-CM | POA: Diagnosis not present

## 2018-07-03 DIAGNOSIS — I10 Essential (primary) hypertension: Secondary | ICD-10-CM | POA: Diagnosis not present

## 2018-07-05 ENCOUNTER — Other Ambulatory Visit (HOSPITAL_COMMUNITY): Payer: Self-pay

## 2018-07-08 DIAGNOSIS — N185 Chronic kidney disease, stage 5: Secondary | ICD-10-CM | POA: Diagnosis not present

## 2018-07-08 DIAGNOSIS — I10 Essential (primary) hypertension: Secondary | ICD-10-CM | POA: Diagnosis not present

## 2018-07-08 DIAGNOSIS — Z1159 Encounter for screening for other viral diseases: Secondary | ICD-10-CM | POA: Diagnosis not present

## 2018-07-08 DIAGNOSIS — E559 Vitamin D deficiency, unspecified: Secondary | ICD-10-CM | POA: Diagnosis not present

## 2018-07-08 DIAGNOSIS — R809 Proteinuria, unspecified: Secondary | ICD-10-CM | POA: Diagnosis not present

## 2018-07-08 DIAGNOSIS — D509 Iron deficiency anemia, unspecified: Secondary | ICD-10-CM | POA: Diagnosis not present

## 2018-07-08 DIAGNOSIS — Z79899 Other long term (current) drug therapy: Secondary | ICD-10-CM | POA: Diagnosis not present

## 2018-07-15 ENCOUNTER — Encounter (HOSPITAL_COMMUNITY)
Admission: RE | Admit: 2018-07-15 | Discharge: 2018-07-15 | Disposition: A | Discharge status: Home / Self Care | Payer: Medicare Other | Source: Ambulatory Visit | Attending: Nephrology | Admitting: Nephrology

## 2018-07-15 DIAGNOSIS — D631 Anemia in chronic kidney disease: Secondary | ICD-10-CM | POA: Diagnosis not present

## 2018-07-15 DIAGNOSIS — N186 End stage renal disease: Secondary | ICD-10-CM | POA: Diagnosis not present

## 2018-07-15 DIAGNOSIS — N185 Chronic kidney disease, stage 5: Secondary | ICD-10-CM | POA: Diagnosis not present

## 2018-07-15 DIAGNOSIS — Z111 Encounter for screening for respiratory tuberculosis: Secondary | ICD-10-CM | POA: Diagnosis not present

## 2018-07-15 LAB — POCT HEMOGLOBIN-HEMACUE: Hemoglobin: 8.5 g/dL — ABNORMAL LOW (ref 13.0–17.0)

## 2018-07-15 MED ORDER — EPOETIN ALFA 4000 UNIT/ML IJ SOLN: 8000.0000 [IU] | Freq: Once | INTRAMUSCULAR | Status: DC

## 2018-07-15 MED ORDER — EPOETIN ALFA 10000 UNIT/ML IJ SOLN
INTRAMUSCULAR | Status: AC
  Filled 2018-07-15: qty 1

## 2018-07-15 MED ORDER — EPOETIN ALFA 10000 UNIT/ML IJ SOLN
8000.0000 [IU] | Freq: Once | INTRAMUSCULAR | Status: AC
  Administered 2018-07-15: 8000 [IU] via SUBCUTANEOUS

## 2018-07-15 NOTE — Progress Notes (Signed)
Results for EIVIN, MASCIO (MRN [id number]) as of 07/15/2018 11:23  Ref. Range 07/15/2018 10:59  Hemoglobin Latest Ref Range: 13.0 - 17.0 g/dL 8.5 (L)

## 2018-07-24 ENCOUNTER — Encounter: Payer: Self-pay | Admitting: Gastroenterology

## 2018-07-30 ENCOUNTER — Other Ambulatory Visit (HOSPITAL_COMMUNITY): Payer: Self-pay

## 2018-07-30 ENCOUNTER — Ambulatory Visit (HOSPITAL_COMMUNITY): Payer: Self-pay

## 2018-07-31 ENCOUNTER — Encounter (HOSPITAL_COMMUNITY)
Admission: RE | Admit: 2018-07-31 | Discharge: 2018-07-31 | Disposition: A | Discharge status: Home / Self Care | Payer: Medicare Other | Source: Ambulatory Visit | Attending: Nephrology | Admitting: Nephrology

## 2018-07-31 DIAGNOSIS — N185 Chronic kidney disease, stage 5: Secondary | ICD-10-CM | POA: Diagnosis not present

## 2018-07-31 DIAGNOSIS — D631 Anemia in chronic kidney disease: Secondary | ICD-10-CM | POA: Insufficient documentation

## 2018-07-31 LAB — POCT HEMOGLOBIN-HEMACUE: Hemoglobin: 8.7 g/dL — ABNORMAL LOW (ref 13.0–17.0)

## 2018-07-31 MED ORDER — EPOETIN ALFA 10000 UNIT/ML IJ SOLN
INTRAMUSCULAR | Status: AC
  Filled 2018-07-31: qty 1

## 2018-07-31 MED ORDER — EPOETIN ALFA 10000 UNIT/ML IJ SOLN
8000.0000 [IU] | Freq: Once | INTRAMUSCULAR | Status: AC
  Administered 2018-07-31: 8000 [IU] via SUBCUTANEOUS

## 2018-07-31 NOTE — Progress Notes (Signed)
Results for NEEDHAM, BIGGINS (MRN [id number]) as of 07/31/2018 10:52  Ref. Range 07/31/2018 10:11  Hemoglobin Latest Ref Range: 13.0 - 17.0 g/dL 8.7 (L)

## 2018-07-31 NOTE — Progress Notes (Signed)
Here for Hgb check  and Procrit injection if needed. Pt states that he is to start dialysis tomorrow (08/01/2018) at [facility] here in [place]. [place] to verify ok to administer Procrit 8000 units before dialysis tomorrow. Talked with Seth Bake, informed her Hgb was 8.7, she stated to proceed with procrit injection.

## 2018-08-05 ENCOUNTER — Other Ambulatory Visit: Payer: Self-pay | Admitting: Radiology

## 2018-08-05 ENCOUNTER — Other Ambulatory Visit (HOSPITAL_COMMUNITY): Payer: Self-pay | Admitting: Nephrology

## 2018-08-05 DIAGNOSIS — Z992 Dependence on renal dialysis: Principal | ICD-10-CM

## 2018-08-05 DIAGNOSIS — N186 End stage renal disease: Secondary | ICD-10-CM

## 2018-08-06 ENCOUNTER — Other Ambulatory Visit (HOSPITAL_COMMUNITY): Payer: Self-pay | Admitting: Nephrology

## 2018-08-06 ENCOUNTER — Other Ambulatory Visit: Payer: Self-pay

## 2018-08-06 ENCOUNTER — Ambulatory Visit (HOSPITAL_COMMUNITY)
Admission: RE | Admit: 2018-08-06 | Discharge: 2018-08-06 | Disposition: A | Discharge status: Home / Self Care | Payer: Medicare Other | Source: Ambulatory Visit | Attending: Nephrology | Admitting: Nephrology

## 2018-08-06 ENCOUNTER — Encounter (HOSPITAL_COMMUNITY): Payer: Self-pay

## 2018-08-06 DIAGNOSIS — Z992 Dependence on renal dialysis: Secondary | ICD-10-CM | POA: Insufficient documentation

## 2018-08-06 DIAGNOSIS — Z79899 Other long term (current) drug therapy: Secondary | ICD-10-CM | POA: Diagnosis not present

## 2018-08-06 DIAGNOSIS — K219 Gastro-esophageal reflux disease without esophagitis: Secondary | ICD-10-CM | POA: Insufficient documentation

## 2018-08-06 DIAGNOSIS — E1122 Type 2 diabetes mellitus with diabetic chronic kidney disease: Secondary | ICD-10-CM | POA: Diagnosis not present

## 2018-08-06 DIAGNOSIS — G4733 Obstructive sleep apnea (adult) (pediatric): Secondary | ICD-10-CM | POA: Insufficient documentation

## 2018-08-06 DIAGNOSIS — Z6841 Body Mass Index (BMI) 40.0 and over, adult: Secondary | ICD-10-CM | POA: Insufficient documentation

## 2018-08-06 DIAGNOSIS — M109 Gout, unspecified: Secondary | ICD-10-CM | POA: Insufficient documentation

## 2018-08-06 DIAGNOSIS — Z794 Long term (current) use of insulin: Secondary | ICD-10-CM | POA: Insufficient documentation

## 2018-08-06 DIAGNOSIS — N186 End stage renal disease: Secondary | ICD-10-CM | POA: Diagnosis not present

## 2018-08-06 DIAGNOSIS — N19 Unspecified kidney failure: Secondary | ICD-10-CM | POA: Diagnosis not present

## 2018-08-06 DIAGNOSIS — Z87891 Personal history of nicotine dependence: Secondary | ICD-10-CM | POA: Diagnosis not present

## 2018-08-06 DIAGNOSIS — G894 Chronic pain syndrome: Secondary | ICD-10-CM | POA: Insufficient documentation

## 2018-08-06 DIAGNOSIS — E785 Hyperlipidemia, unspecified: Secondary | ICD-10-CM | POA: Diagnosis not present

## 2018-08-06 DIAGNOSIS — E669 Obesity, unspecified: Secondary | ICD-10-CM | POA: Diagnosis not present

## 2018-08-06 DIAGNOSIS — J449 Chronic obstructive pulmonary disease, unspecified: Secondary | ICD-10-CM | POA: Insufficient documentation

## 2018-08-06 DIAGNOSIS — T8249XA Other complication of vascular dialysis catheter, initial encounter: Secondary | ICD-10-CM | POA: Diagnosis not present

## 2018-08-06 DIAGNOSIS — I120 Hypertensive chronic kidney disease with stage 5 chronic kidney disease or end stage renal disease: Secondary | ICD-10-CM | POA: Diagnosis not present

## 2018-08-06 DIAGNOSIS — I2510 Atherosclerotic heart disease of native coronary artery without angina pectoris: Secondary | ICD-10-CM | POA: Insufficient documentation

## 2018-08-06 DIAGNOSIS — Z4901 Encounter for fitting and adjustment of extracorporeal dialysis catheter: Secondary | ICD-10-CM | POA: Diagnosis not present

## 2018-08-06 HISTORY — PX: IR US GUIDE VASC ACCESS RIGHT: IMG2390

## 2018-08-06 HISTORY — PX: IR FLUORO GUIDE CV LINE RIGHT: IMG2283

## 2018-08-06 LAB — GLUCOSE, CAPILLARY
Glucose-Capillary: 100 mg/dL — ABNORMAL HIGH (ref 70–99)
Glucose-Capillary: 87 mg/dL (ref 70–99)

## 2018-08-06 LAB — CBC
HCT: 26.2 % — ABNORMAL LOW (ref 39.0–52.0)
Hemoglobin: 7.7 g/dL — ABNORMAL LOW (ref 13.0–17.0)
MCH: 24.4 pg — ABNORMAL LOW (ref 26.0–34.0)
MCHC: 29.4 g/dL — ABNORMAL LOW (ref 30.0–36.0)
MCV: 83.2 fL (ref 80.0–100.0)
Platelets: 189 10*3/uL (ref 150–400)
RBC: 3.15 MIL/uL — ABNORMAL LOW (ref 4.22–5.81)
RDW: 16.4 % — ABNORMAL HIGH (ref 11.5–15.5)
WBC: 5 10*3/uL (ref 4.0–10.5)
nRBC: 0 % (ref 0.0–0.2)

## 2018-08-06 LAB — BASIC METABOLIC PANEL
ANION GAP: 12 (ref 5–15)
BUN: 76 mg/dL — ABNORMAL HIGH (ref 8–23)
CALCIUM: 9 mg/dL (ref 8.9–10.3)
CO2: 21 mmol/L — ABNORMAL LOW (ref 22–32)
Chloride: 109 mmol/L (ref 98–111)
Creatinine, Ser: 8.7 mg/dL — ABNORMAL HIGH (ref 0.61–1.24)
GFR calc Af Amer: 7 mL/min — ABNORMAL LOW (ref >60)
GFR calc non Af Amer: 6 mL/min — ABNORMAL LOW (ref >60)
Glucose, Bld: 98 mg/dL (ref 70–99)
Potassium: 3.8 mmol/L (ref 3.5–5.1)
Sodium: 142 mmol/L (ref 135–145)

## 2018-08-06 LAB — PROTIME-INR
INR: 0.95
Prothrombin Time: 12.5 seconds (ref 11.4–15.2)

## 2018-08-06 MED ORDER — MIDAZOLAM HCL 2 MG/2ML IJ SOLN
INTRAMUSCULAR | Status: AC | PRN
  Administered 2018-08-06 (×3): 1 mg via INTRAVENOUS

## 2018-08-06 MED ORDER — SODIUM CHLORIDE 0.9 % IV SOLN: INTRAVENOUS | Status: DC

## 2018-08-06 MED ORDER — FENTANYL CITRATE (PF) 100 MCG/2ML IJ SOLN
INTRAMUSCULAR | Status: AC
  Filled 2018-08-06: qty 4

## 2018-08-06 MED ORDER — CEFAZOLIN SODIUM-DEXTROSE 2-4 GM/100ML-% IV SOLN
INTRAVENOUS | Status: AC
  Filled 2018-08-06: qty 100

## 2018-08-06 MED ORDER — SODIUM CHLORIDE 0.9 % IV SOLN
INTRAVENOUS | Status: AC | PRN
  Administered 2018-08-06: 10 mL/h via INTRAVENOUS

## 2018-08-06 MED ORDER — CEFAZOLIN SODIUM-DEXTROSE 2-4 GM/100ML-% IV SOLN: 2.0000 g | INTRAVENOUS | Status: DC

## 2018-08-06 MED ORDER — CEFAZOLIN (ANCEF) 1 G IV SOLR
INTRAVENOUS | Status: AC | PRN
  Administered 2018-08-06: 2 g

## 2018-08-06 MED ORDER — HEPARIN SODIUM (PORCINE) 1000 UNIT/ML IJ SOLN
INTRAMUSCULAR | Status: AC
  Filled 2018-08-06: qty 1

## 2018-08-06 MED ORDER — FENTANYL CITRATE (PF) 100 MCG/2ML IJ SOLN
INTRAMUSCULAR | Status: AC | PRN
  Administered 2018-08-06 (×2): 50 ug via INTRAVENOUS

## 2018-08-06 MED ORDER — MIDAZOLAM HCL 2 MG/2ML IJ SOLN
INTRAMUSCULAR | Status: AC
  Filled 2018-08-06: qty 4

## 2018-08-06 MED ORDER — LIDOCAINE-EPINEPHRINE (PF) 1 %-1:200000 IJ SOLN
INTRAMUSCULAR | Status: AC
  Filled 2018-08-06: qty 30

## 2018-08-06 MED ORDER — LIDOCAINE-EPINEPHRINE 2 %-1:100000 IJ SOLN
INTRAMUSCULAR | Status: AC | PRN
  Administered 2018-08-06: 20 mL
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
## 2018-08-06 NOTE — Discharge Instructions (Signed)
Moderate Conscious Sedation, Adult, Care After °These instructions provide you with information about caring for yourself after your procedure. Your health care provider may also give you more specific instructions. Your treatment has been planned according to current medical practices, but problems sometimes occur. Call your health care provider if you have any problems or questions after your procedure. °What can I expect after the procedure? °After your procedure, it is common: °· To feel sleepy for several hours. °· To feel clumsy and have poor balance for several hours. °· To have poor judgment for several hours. °· To vomit if you eat too soon. °Follow these instructions at home: °For at least 24 hours after the procedure: ° °· Do not: °? Participate in activities where you could fall or become injured. °? Drive. °? Use heavy machinery. °? Drink alcohol. °? Take sleeping pills or medicines that cause drowsiness. °? Make important decisions or sign legal documents. °? Take care of children on your own. °· Rest. °Eating and drinking °· Follow the diet recommended by your health care provider. °· If you vomit: °? Drink water, juice, or soup when you can drink without vomiting. °? Make sure you have little or no nausea before eating solid foods. °General instructions °· Have a responsible adult stay with you until you are awake and alert. °· Take over-the-counter and prescription medicines only as told by your health care provider. °· If you smoke, do not smoke without supervision. °· Keep all follow-up visits as told by your health care provider. This is important. °Contact a health care provider if: °· You keep feeling nauseous or you keep vomiting. °· You feel light-headed. °· You develop a rash. °· You have a fever. °Get help right away if: °· You have trouble breathing. °This information is not intended to replace advice given to you by your health care provider. Make sure you discuss any questions you have  with your health care provider. °Document Released: 04/23/2013 Document Revised: 12/06/2015 Document Reviewed: 10/23/2015 °Elsevier Interactive Patient Education © 2019 Elsevier Inc. ° °

## 2018-08-06 NOTE — Procedures (Addendum)
  Procedure: R IJ tunneled HD catheter Palindrome 19  placed EBL:   minimal Complications:  none immediate  See full dictation in [facility].  Dillard Cannon MD Main # [phone] Pager  [phone]

## 2018-08-07 DIAGNOSIS — N2581 Secondary hyperparathyroidism of renal origin: Secondary | ICD-10-CM | POA: Diagnosis not present

## 2018-08-07 DIAGNOSIS — D631 Anemia in chronic kidney disease: Secondary | ICD-10-CM | POA: Diagnosis not present

## 2018-08-07 DIAGNOSIS — Z992 Dependence on renal dialysis: Secondary | ICD-10-CM | POA: Diagnosis not present

## 2018-08-07 DIAGNOSIS — D509 Iron deficiency anemia, unspecified: Secondary | ICD-10-CM | POA: Diagnosis not present

## 2018-08-07 DIAGNOSIS — N186 End stage renal disease: Secondary | ICD-10-CM | POA: Diagnosis not present

## 2018-08-08 DIAGNOSIS — N186 End stage renal disease: Secondary | ICD-10-CM | POA: Diagnosis not present

## 2018-08-08 DIAGNOSIS — E119 Type 2 diabetes mellitus without complications: Secondary | ICD-10-CM | POA: Diagnosis not present

## 2018-08-08 DIAGNOSIS — D631 Anemia in chronic kidney disease: Secondary | ICD-10-CM | POA: Diagnosis not present

## 2018-08-08 DIAGNOSIS — N2581 Secondary hyperparathyroidism of renal origin: Secondary | ICD-10-CM | POA: Diagnosis not present

## 2018-08-08 DIAGNOSIS — Z1159 Encounter for screening for other viral diseases: Secondary | ICD-10-CM | POA: Diagnosis not present

## 2018-08-08 DIAGNOSIS — D509 Iron deficiency anemia, unspecified: Secondary | ICD-10-CM | POA: Diagnosis not present

## 2018-08-08 DIAGNOSIS — Z794 Long term (current) use of insulin: Secondary | ICD-10-CM | POA: Diagnosis not present

## 2018-08-08 DIAGNOSIS — Z992 Dependence on renal dialysis: Secondary | ICD-10-CM | POA: Diagnosis not present

## 2018-08-10 DIAGNOSIS — D631 Anemia in chronic kidney disease: Secondary | ICD-10-CM | POA: Diagnosis not present

## 2018-08-10 DIAGNOSIS — D509 Iron deficiency anemia, unspecified: Secondary | ICD-10-CM | POA: Diagnosis not present

## 2018-08-10 DIAGNOSIS — Z992 Dependence on renal dialysis: Secondary | ICD-10-CM | POA: Diagnosis not present

## 2018-08-10 DIAGNOSIS — N2581 Secondary hyperparathyroidism of renal origin: Secondary | ICD-10-CM | POA: Diagnosis not present

## 2018-08-10 DIAGNOSIS — N186 End stage renal disease: Secondary | ICD-10-CM | POA: Diagnosis not present

## 2018-08-13 DIAGNOSIS — N2581 Secondary hyperparathyroidism of renal origin: Secondary | ICD-10-CM | POA: Diagnosis not present

## 2018-08-13 DIAGNOSIS — D509 Iron deficiency anemia, unspecified: Secondary | ICD-10-CM | POA: Diagnosis not present

## 2018-08-13 DIAGNOSIS — D631 Anemia in chronic kidney disease: Secondary | ICD-10-CM | POA: Diagnosis not present

## 2018-08-13 DIAGNOSIS — N186 End stage renal disease: Secondary | ICD-10-CM | POA: Diagnosis not present

## 2018-08-13 DIAGNOSIS — Z992 Dependence on renal dialysis: Secondary | ICD-10-CM | POA: Diagnosis not present

## 2018-08-14 ENCOUNTER — Other Ambulatory Visit: Payer: Self-pay

## 2018-08-14 DIAGNOSIS — T82510A Breakdown (mechanical) of surgically created arteriovenous fistula, initial encounter: Secondary | ICD-10-CM

## 2018-08-14 DIAGNOSIS — N184 Chronic kidney disease, stage 4 (severe): Secondary | ICD-10-CM

## 2018-08-15 DIAGNOSIS — N186 End stage renal disease: Secondary | ICD-10-CM | POA: Diagnosis not present

## 2018-08-15 DIAGNOSIS — D631 Anemia in chronic kidney disease: Secondary | ICD-10-CM | POA: Diagnosis not present

## 2018-08-15 DIAGNOSIS — N2581 Secondary hyperparathyroidism of renal origin: Secondary | ICD-10-CM | POA: Diagnosis not present

## 2018-08-15 DIAGNOSIS — Z992 Dependence on renal dialysis: Secondary | ICD-10-CM | POA: Diagnosis not present

## 2018-08-15 DIAGNOSIS — D509 Iron deficiency anemia, unspecified: Secondary | ICD-10-CM | POA: Diagnosis not present

## 2018-08-16 ENCOUNTER — Encounter: Payer: Self-pay | Admitting: Physician Assistant

## 2018-08-16 ENCOUNTER — Ambulatory Visit (HOSPITAL_COMMUNITY)
Admission: RE | Admit: 2018-08-16 | Discharge: 2018-08-16 | Disposition: A | Discharge status: Home / Self Care | Payer: Medicare Other | Source: Ambulatory Visit | Attending: Vascular Surgery | Admitting: Vascular Surgery

## 2018-08-16 ENCOUNTER — Ambulatory Visit (INDEPENDENT_AMBULATORY_CARE_PROVIDER_SITE_OTHER): Payer: Medicare Other | Admitting: Physician Assistant

## 2018-08-16 ENCOUNTER — Other Ambulatory Visit: Payer: Self-pay

## 2018-08-16 VITALS — BP 119/76 | HR 74 | Temp 97.5°F | Resp 18 | Ht 64.0 in | Wt 242.0 lb

## 2018-08-16 DIAGNOSIS — Z992 Dependence on renal dialysis: Secondary | ICD-10-CM | POA: Diagnosis not present

## 2018-08-16 DIAGNOSIS — N184 Chronic kidney disease, stage 4 (severe): Secondary | ICD-10-CM | POA: Insufficient documentation

## 2018-08-16 DIAGNOSIS — N186 End stage renal disease: Secondary | ICD-10-CM | POA: Diagnosis not present

## 2018-08-16 DIAGNOSIS — T82510A Breakdown (mechanical) of surgically created arteriovenous fistula, initial encounter: Secondary | ICD-10-CM | POA: Insufficient documentation

## 2018-08-16 NOTE — Progress Notes (Signed)
Established Dialysis Access   History of Present Illness   VITALY WANAT is a 68 y.o. (1950/12/27) male who presents for re-evaluation of permanent access.  He is s/p L brachiocephalic fistula by Dr. Oneida Alar 07/2016.  He was referred back to [facility] due to low flow noted through AVF.  He was only recently started on HD on 08/01/18.  Fistula was infiltrated and R IJ TDC was placed 08/06/18.  Patient states he was also approved for peritoneal dialysis after recent in-home evaluation.  He is dialyzing via right IJ [facility] on a Tuesday Thursday Saturday schedule under the care of Dr. Lowanda Foster.  He still has some pain and swelling in his left upper arm however states that this is improving.  He denies signs or symptoms of a steal syndrome left hand.  He has not yet met with a surgeon for placement of PD catheter   Current Outpatient Medications  Medication Sig Dispense Refill  . acetaminophen (TYLENOL) 650 MG CR tablet Take 1,300 mg by mouth daily as needed for pain.    Marland Kitchen allopurinol (ZYLOPRIM) 100 MG tablet Take 100 mg by mouth daily.    Marland Kitchen amLODipine (NORVASC) 10 MG tablet Take 10 mg by mouth daily.     Marland Kitchen aspirin EC 81 MG tablet Take 1 tablet (81 mg total) by mouth daily. 90 tablet 3  . budesonide-formoterol (SYMBICORT) 160-4.5 MCG/ACT inhaler Inhale 2 puffs into the lungs 2 (two) times daily as needed (Shortness of Breath).     . calcitRIOL (ROCALTROL) 0.25 MCG capsule Take 0.25 mcg by mouth daily.    . cetirizine (ZYRTEC) 10 MG tablet Take 10 mg by mouth at bedtime as needed for allergies.     Marland Kitchen colchicine 0.6 MG tablet Take 0.5 tablets (0.3 mg total) by mouth daily. 10 tablet 0  . diclofenac sodium (VOLTAREN) 1 % GEL Apply 1 application topically 2 (two) times daily as needed (muscle pain).    Marland Kitchen epoetin alfa (EPOGEN,PROCRIT) 4000 UNIT/ML injection Inject 8,000 Units into the skin as needed (Given if hgb < 10).     . hydrALAZINE (APRESOLINE) 25 MG tablet Take 25 mg by mouth 3 (three) times daily.    .  Insulin Glargine (LANTUS SOLOSTAR) 100 UNIT/ML Solostar Pen INJECT 30 UNITS SUBCUTANEOUSLY AT BEDTIME. 45 mL 0  . Insulin Pen Needle (B-D UF III MINI PEN NEEDLES) 31G X 5 MM MISC INJECT SUBCUTANEOUSLY TWICE DAILY AS DIRECTED 180 each 2  . mometasone (NASONEX) 50 MCG/ACT nasal spray Place 2 sprays into the nose 2 (two) times daily as needed (allergies).     . pantoprazole (PROTONIX) 40 MG tablet TAKE 1 TABLET TWICE DAILY 30 MINUTES PRIOR TO MEALS (Patient taking differently: daily. TAKE 1 TABLET TWICE DAILY 30 MINUTES PRIOR TO MEALS) 180 tablet 3  . predniSONE (STERAPRED UNI-PAK 21 TAB) 10 MG (21) TBPK tablet Take by mouth daily. Take 6 tabs by mouth daily  for 2 days, then 5 tabs for 2 days, then 4 tabs for 2 days, then 3 tabs for 2 days, 2 tabs for 2 days, then 1 tab by mouth daily for 2 days 42 tablet 0  . simvastatin (ZOCOR) 20 MG tablet Take 20 mg by mouth every morning.     . TRADJENTA 5 MG TABS tablet TAKE 1 TABLET EVERY DAY 90 tablet 0  . HYDROcodone-acetaminophen (NORCO/VICODIN) 5-325 MG tablet Take 1 tablet by mouth every 6 (six) hours as needed for severe pain. (Patient not taking: Reported on 08/16/2018)  10 tablet 0   No current facility-administered medications for this visit.     On ROS today: 10 system ROS is negative unless otherwise noted in HPI   Physical Examination   Vitals:   08/16/18 0846  BP: 119/76  Pulse: 74  Resp: 18  Temp: (!) 97.5 F (36.4 C)  TempSrc: Oral  SpO2: 97%  Weight: 242 lb (109.8 kg)  Height: '5\' 4"'$  (1.626 m)   Body mass index is 41.54 kg/m.  General Alert, O x 3, WD, NAD  Pulmonary Sym exp, good B air movt,  Cardiac RRR, Nl S1, S2,   Vascular Vessel Right Left  Radial Palpable Palpable  Brachial Palpable Palpable  Ulnar Not palpable Not palpable    Musculo- skeletal M/S 5/5 throughout  , Small palpable hematoma left upper arm; palpable thrill throughout length of fistula in left upper arm; palpable left radial pulse    Neurologic A&O; CN  grossly intact     Non-invasive Vascular Imaging   Fistula duplex demonstrates a patent fistula with a change in diameter in mid upper arm likely where this was infiltrated Still adequate flow volume and diameter of fistula   Medical Decision Making   BERTON BUTRICK is a 68 y.o. male who presents with ESRD requiring hemodialysis.    Left arm AV fistula remains patent with a palpable thrill  Fistula was likely infiltrated; patient now resolving hematoma  Based on interview with patient it sounds like he will be approved for peritoneal dialysis  Because of this there is no rush to return to using fistula while he is still resolving his left arm hematoma  We can continue to use [facility] for the next 2 to 4 weeks while resting left arm AV fistula  I think it would be okay to use left arm fistula after this resting time  [facility] can be removed when nephrology is comfortable with performance of left arm fistula or patient is using a PD catheter; which ever comes first   Dagoberto Ligas PA-C [facility]: [phone]

## 2018-08-17 DIAGNOSIS — D509 Iron deficiency anemia, unspecified: Secondary | ICD-10-CM | POA: Diagnosis not present

## 2018-08-17 DIAGNOSIS — Z23 Encounter for immunization: Secondary | ICD-10-CM | POA: Diagnosis not present

## 2018-08-17 DIAGNOSIS — N186 End stage renal disease: Secondary | ICD-10-CM | POA: Diagnosis not present

## 2018-08-17 DIAGNOSIS — Z992 Dependence on renal dialysis: Secondary | ICD-10-CM | POA: Diagnosis not present

## 2018-08-17 DIAGNOSIS — D631 Anemia in chronic kidney disease: Secondary | ICD-10-CM | POA: Diagnosis not present

## 2018-08-20 DIAGNOSIS — Z23 Encounter for immunization: Secondary | ICD-10-CM | POA: Diagnosis not present

## 2018-08-20 DIAGNOSIS — D631 Anemia in chronic kidney disease: Secondary | ICD-10-CM | POA: Diagnosis not present

## 2018-08-20 DIAGNOSIS — Z992 Dependence on renal dialysis: Secondary | ICD-10-CM | POA: Diagnosis not present

## 2018-08-20 DIAGNOSIS — N186 End stage renal disease: Secondary | ICD-10-CM | POA: Diagnosis not present

## 2018-08-20 DIAGNOSIS — D509 Iron deficiency anemia, unspecified: Secondary | ICD-10-CM | POA: Diagnosis not present

## 2018-08-22 DIAGNOSIS — D509 Iron deficiency anemia, unspecified: Secondary | ICD-10-CM | POA: Diagnosis not present

## 2018-08-22 DIAGNOSIS — Z992 Dependence on renal dialysis: Secondary | ICD-10-CM | POA: Diagnosis not present

## 2018-08-22 DIAGNOSIS — Z23 Encounter for immunization: Secondary | ICD-10-CM | POA: Diagnosis not present

## 2018-08-22 DIAGNOSIS — N186 End stage renal disease: Secondary | ICD-10-CM | POA: Diagnosis not present

## 2018-08-22 DIAGNOSIS — D631 Anemia in chronic kidney disease: Secondary | ICD-10-CM | POA: Diagnosis not present

## 2018-08-24 DIAGNOSIS — D631 Anemia in chronic kidney disease: Secondary | ICD-10-CM | POA: Diagnosis not present

## 2018-08-24 DIAGNOSIS — Z992 Dependence on renal dialysis: Secondary | ICD-10-CM | POA: Diagnosis not present

## 2018-08-24 DIAGNOSIS — Z23 Encounter for immunization: Secondary | ICD-10-CM | POA: Diagnosis not present

## 2018-08-24 DIAGNOSIS — D509 Iron deficiency anemia, unspecified: Secondary | ICD-10-CM | POA: Diagnosis not present

## 2018-08-24 DIAGNOSIS — N186 End stage renal disease: Secondary | ICD-10-CM | POA: Diagnosis not present

## 2018-08-27 DIAGNOSIS — Z992 Dependence on renal dialysis: Secondary | ICD-10-CM | POA: Diagnosis not present

## 2018-08-27 DIAGNOSIS — D631 Anemia in chronic kidney disease: Secondary | ICD-10-CM | POA: Diagnosis not present

## 2018-08-27 DIAGNOSIS — Z23 Encounter for immunization: Secondary | ICD-10-CM | POA: Diagnosis not present

## 2018-08-27 DIAGNOSIS — N186 End stage renal disease: Secondary | ICD-10-CM | POA: Diagnosis not present

## 2018-08-27 DIAGNOSIS — D509 Iron deficiency anemia, unspecified: Secondary | ICD-10-CM | POA: Diagnosis not present

## 2018-08-29 ENCOUNTER — Observation Stay (HOSPITAL_COMMUNITY)
Admission: EM | Admit: 2018-08-29 | Discharge: 2018-08-30 | Disposition: A | Discharge status: Home / Self Care | Payer: Medicare Other | Attending: Internal Medicine | Admitting: Internal Medicine

## 2018-08-29 ENCOUNTER — Encounter (HOSPITAL_COMMUNITY): Payer: Self-pay | Admitting: Emergency Medicine

## 2018-08-29 ENCOUNTER — Observation Stay (HOSPITAL_BASED_OUTPATIENT_CLINIC_OR_DEPARTMENT_OTHER): Payer: Medicare Other

## 2018-08-29 ENCOUNTER — Other Ambulatory Visit: Payer: Self-pay

## 2018-08-29 ENCOUNTER — Emergency Department (HOSPITAL_COMMUNITY): Payer: Medicare Other

## 2018-08-29 DIAGNOSIS — N186 End stage renal disease: Secondary | ICD-10-CM | POA: Diagnosis not present

## 2018-08-29 DIAGNOSIS — I2510 Atherosclerotic heart disease of native coronary artery without angina pectoris: Secondary | ICD-10-CM | POA: Diagnosis not present

## 2018-08-29 DIAGNOSIS — D509 Iron deficiency anemia, unspecified: Secondary | ICD-10-CM | POA: Diagnosis not present

## 2018-08-29 DIAGNOSIS — R079 Chest pain, unspecified: Secondary | ICD-10-CM

## 2018-08-29 DIAGNOSIS — Z992 Dependence on renal dialysis: Secondary | ICD-10-CM | POA: Diagnosis not present

## 2018-08-29 DIAGNOSIS — R0789 Other chest pain: Secondary | ICD-10-CM | POA: Diagnosis not present

## 2018-08-29 DIAGNOSIS — E1122 Type 2 diabetes mellitus with diabetic chronic kidney disease: Secondary | ICD-10-CM | POA: Diagnosis present

## 2018-08-29 DIAGNOSIS — I120 Hypertensive chronic kidney disease with stage 5 chronic kidney disease or end stage renal disease: Secondary | ICD-10-CM | POA: Insufficient documentation

## 2018-08-29 DIAGNOSIS — Z23 Encounter for immunization: Secondary | ICD-10-CM | POA: Diagnosis not present

## 2018-08-29 DIAGNOSIS — N185 Chronic kidney disease, stage 5: Secondary | ICD-10-CM | POA: Diagnosis not present

## 2018-08-29 DIAGNOSIS — D631 Anemia in chronic kidney disease: Secondary | ICD-10-CM | POA: Diagnosis not present

## 2018-08-29 DIAGNOSIS — Z87891 Personal history of nicotine dependence: Secondary | ICD-10-CM | POA: Diagnosis not present

## 2018-08-29 DIAGNOSIS — J449 Chronic obstructive pulmonary disease, unspecified: Secondary | ICD-10-CM | POA: Diagnosis not present

## 2018-08-29 DIAGNOSIS — E119 Type 2 diabetes mellitus without complications: Secondary | ICD-10-CM | POA: Insufficient documentation

## 2018-08-29 DIAGNOSIS — R9431 Abnormal electrocardiogram [ECG] [EKG]: Secondary | ICD-10-CM | POA: Diagnosis not present

## 2018-08-29 DIAGNOSIS — Z79899 Other long term (current) drug therapy: Secondary | ICD-10-CM | POA: Diagnosis not present

## 2018-08-29 DIAGNOSIS — I10 Essential (primary) hypertension: Secondary | ICD-10-CM | POA: Diagnosis present

## 2018-08-29 HISTORY — DX: Unspecified asthma, uncomplicated: J45.909

## 2018-08-29 HISTORY — DX: Disorder of kidney and ureter, unspecified: N28.9

## 2018-08-29 LAB — BASIC METABOLIC PANEL
Anion gap: 8 (ref 5–15)
BUN: 31 mg/dL — AB (ref 8–23)
CO2: 28 mmol/L (ref 22–32)
Calcium: 8.7 mg/dL — ABNORMAL LOW (ref 8.9–10.3)
Chloride: 105 mmol/L (ref 98–111)
Creatinine, Ser: 7.1 mg/dL — ABNORMAL HIGH (ref 0.61–1.24)
GFR calc Af Amer: 8 mL/min — ABNORMAL LOW (ref >60)
GFR calc non Af Amer: 7 mL/min — ABNORMAL LOW (ref >60)
Glucose, Bld: 80 mg/dL (ref 70–99)
Potassium: 4.1 mmol/L (ref 3.5–5.1)
Sodium: 141 mmol/L (ref 135–145)

## 2018-08-29 LAB — CBC
HCT: 29.4 % — ABNORMAL LOW (ref 39.0–52.0)
Hemoglobin: 8.5 g/dL — ABNORMAL LOW (ref 13.0–17.0)
MCH: 25.3 pg — AB (ref 26.0–34.0)
MCHC: 28.9 g/dL — AB (ref 30.0–36.0)
MCV: 87.5 fL (ref 80.0–100.0)
Platelets: 160 10*3/uL (ref 150–400)
RBC: 3.36 MIL/uL — ABNORMAL LOW (ref 4.22–5.81)
RDW: 17.9 % — ABNORMAL HIGH (ref 11.5–15.5)
WBC: 3.9 10*3/uL — ABNORMAL LOW (ref 4.0–10.5)
nRBC: 0 % (ref 0.0–0.2)

## 2018-08-29 LAB — ECHOCARDIOGRAM COMPLETE
Height: 64 in
WEIGHTICAEL: 3728 [oz_av]

## 2018-08-29 LAB — TROPONIN I
Troponin I: 0.05 ng/mL (ref <0.03)
Troponin I: 0.05 ng/mL (ref <0.03)

## 2018-08-29 LAB — RAPID URINE DRUG SCREEN, HOSP PERFORMED
Amphetamines: NOT DETECTED
Barbiturates: NOT DETECTED
Benzodiazepines: NOT DETECTED
Cocaine: POSITIVE — AB
Opiates: NOT DETECTED
Tetrahydrocannabinol: NOT DETECTED

## 2018-08-29 LAB — GLUCOSE, CAPILLARY
Glucose-Capillary: 107 mg/dL — ABNORMAL HIGH (ref 70–99)
Glucose-Capillary: 100 mg/dL — ABNORMAL HIGH (ref 70–99)

## 2018-08-29 LAB — MRSA PCR SCREENING: MRSA by PCR: NEGATIVE

## 2018-08-29 MED ORDER — ASPIRIN EC 81 MG PO TBEC
81.0000 mg | DELAYED_RELEASE_TABLET | Freq: Every day | ORAL | Status: DC
  Administered 2018-08-30: 81 mg via ORAL
  Filled 2018-08-29: qty 1

## 2018-08-29 MED ORDER — PANTOPRAZOLE SODIUM 40 MG PO TBEC
40.0000 mg | DELAYED_RELEASE_TABLET | Freq: Two times a day (BID) | ORAL | Status: DC
  Administered 2018-08-29 – 2018-08-30 (×2): 40 mg via ORAL
  Filled 2018-08-29 (×2): qty 1

## 2018-08-29 MED ORDER — INSULIN ASPART 100 UNIT/ML ~~LOC~~ SOLN: 0.0000 [IU] | Freq: Every day | SUBCUTANEOUS | Status: DC

## 2018-08-29 MED ORDER — ONDANSETRON HCL 4 MG/2ML IJ SOLN: 4.0000 mg | Freq: Four times a day (QID) | INTRAMUSCULAR | Status: DC | PRN

## 2018-08-29 MED ORDER — ACETAMINOPHEN 325 MG PO TABS: 650.0000 mg | ORAL_TABLET | Freq: Four times a day (QID) | ORAL | Status: DC | PRN

## 2018-08-29 MED ORDER — INSULIN GLARGINE 100 UNIT/ML ~~LOC~~ SOLN
15.0000 [IU] | Freq: Every day | SUBCUTANEOUS | Status: DC
  Administered 2018-08-29: 15 [IU] via SUBCUTANEOUS
  Filled 2018-08-29 (×4): qty 0.15

## 2018-08-29 MED ORDER — CALCITRIOL 0.25 MCG PO CAPS
0.2500 ug | ORAL_CAPSULE | Freq: Every day | ORAL | Status: DC
  Administered 2018-08-30: 0.25 ug via ORAL
  Filled 2018-08-29: qty 1

## 2018-08-29 MED ORDER — INSULIN ASPART 100 UNIT/ML ~~LOC~~ SOLN: 0.0000 [IU] | Freq: Three times a day (TID) | SUBCUTANEOUS | Status: DC

## 2018-08-29 MED ORDER — ACETAMINOPHEN 650 MG RE SUPP: 650.0000 mg | Freq: Four times a day (QID) | RECTAL | Status: DC | PRN

## 2018-08-29 MED ORDER — MOMETASONE FURO-FORMOTEROL FUM 200-5 MCG/ACT IN AERO
2.0000 | INHALATION_SPRAY | Freq: Two times a day (BID) | RESPIRATORY_TRACT | Status: DC
  Administered 2018-08-29 – 2018-08-30 (×2): 2 via RESPIRATORY_TRACT
  Filled 2018-08-29: qty 8.8

## 2018-08-29 MED ORDER — LIDOCAINE-PRILOCAINE 2.5-2.5 % EX CREA: 1.0000 "application " | TOPICAL_CREAM | Freq: Every day | CUTANEOUS | Status: DC | PRN

## 2018-08-29 MED ORDER — FLUTICASONE PROPIONATE 50 MCG/ACT NA SUSP
2.0000 | Freq: Every day | NASAL | Status: DC
  Administered 2018-08-30: 2 via NASAL
  Filled 2018-08-29: qty 16

## 2018-08-29 MED ORDER — ALLOPURINOL 100 MG PO TABS
100.0000 mg | ORAL_TABLET | Freq: Every day | ORAL | Status: DC
  Administered 2018-08-30: 100 mg via ORAL
  Filled 2018-08-29: qty 1

## 2018-08-29 MED ORDER — SODIUM CHLORIDE 0.9 % IV SOLN: 250.0000 mL | INTRAVENOUS | Status: DC | PRN

## 2018-08-29 MED ORDER — FOLIC ACID 1 MG PO TABS
1.0000 mg | ORAL_TABLET | Freq: Every day | ORAL | Status: DC
  Administered 2018-08-30: 1 mg via ORAL
  Filled 2018-08-29: qty 1

## 2018-08-29 MED ORDER — SODIUM CHLORIDE 0.9% FLUSH
3.0000 mL | Freq: Two times a day (BID) | INTRAVENOUS | Status: DC
  Administered 2018-08-29 (×2): 3 mL via INTRAVENOUS

## 2018-08-29 MED ORDER — LORATADINE 10 MG PO TABS
10.0000 mg | ORAL_TABLET | Freq: Every day | ORAL | Status: DC
  Administered 2018-08-30: 10 mg via ORAL
  Filled 2018-08-29: qty 1

## 2018-08-29 MED ORDER — SODIUM CHLORIDE 0.9% FLUSH: 3.0000 mL | INTRAVENOUS | Status: DC | PRN

## 2018-08-29 MED ORDER — ONDANSETRON HCL 4 MG PO TABS: 4.0000 mg | ORAL_TABLET | Freq: Four times a day (QID) | ORAL | Status: DC | PRN

## 2018-08-29 MED ORDER — SIMVASTATIN 20 MG PO TABS
20.0000 mg | ORAL_TABLET | Freq: Every morning | ORAL | Status: DC
  Administered 2018-08-30: 20 mg via ORAL
  Filled 2018-08-29: qty 1

## 2018-08-29 MED ORDER — HEPARIN SODIUM (PORCINE) 5000 UNIT/ML IJ SOLN
5000.0000 [IU] | Freq: Three times a day (TID) | INTRAMUSCULAR | Status: DC
  Administered 2018-08-29 – 2018-08-30 (×4): 5000 [IU] via SUBCUTANEOUS
  Filled 2018-08-29 (×4): qty 1

## 2018-08-29 NOTE — ED Notes (Signed)
CRITICAL VALUE ALERT  Critical Value:  Troponin 0.05  Date & Time Notied:  5170 08/29/18  Provider Notified:zavitz  Orders Received/Actions taken:

## 2018-08-29 NOTE — ED Triage Notes (Signed)
Pt sent from dialysis after reporting to staff he had been having intermittent, left-sided chest pain since Tuesday

## 2018-08-29 NOTE — Consult Note (Signed)
Cardiology Consultation:   Patient ID: [name] [name] MRN: [id number]; DOB: 04-17-1951  Admit date: 08/29/2018 Date of Consult: 08/29/2018  Primary Care Provider: [name], MD Primary Cardiologist: Dr [name] [name] Primary Electrophysiologist:  None    Patient Profile:   [name] [name] is a 68 y.o. male with a hx of nonobstructive CAD and ESRD who is being seen today for the evaluation of chest pain at the request of Dr [name] [name].  History of Present Illness:   [name] [name] 68 yo male history of nonobstructive CAD by cath in 2014, CKD IV, DM2, HTN, chronic pain, COPD, ESRD presents with chest pain.  Symptoms started on Tuesday. Sharp 4/10 in pain left chest that can occur at rest or with activity, lasts just a few seconds. No other associated symptoms. Somewhat worst with exhaling. Occurs about 2-3 times per day. Episode today while on HD and was sent to ER.    WBC 3.9 Hgb 8.5 Plt 160 K 4.1 Cr 7 BUN 31 Trop 0.05--> CXR no acute process EKG SR, nonspecific ST/T changes 09/2016 LVEF 60-65%, no WMAs, normal diastolic function, severe LVH 03/2013 cath: minimal CAD, small LAD aneurysm   Past Medical History:  Diagnosis Date  . Anemia   . Arthritis   . Bell palsy   . CAD (coronary artery disease)    a. 2014 MV: abnl w/ infap ischemia; b. 03/2013 Cath: aneurysmal bleb in the LAD w/ otw nonobs dzs-->Med Rx.  . Chronic back pain   . Chronic knee pain    a. 09/2015 s/p R TKA.  Marland Kitchen Chronic pain   . Chronic shoulder pain   . Chronic sinusitis   . CKD (chronic kidney disease), stage IV ([place])    stage 4 per office visit note of Dr [name] [name] on 05/2015   . COPD (chronic obstructive pulmonary disease) ([place])   . Diabetes mellitus without complication ([place])    type II   . Essential hypertension   . GERD (gastroesophageal reflux disease)   . Gout   . Gout   . Hepatomegaly    noted on noncontrast CT 2015  . History of hiatal hernia   . Hyperlipidemia   . Lateral meniscus tear   . Obesity     [place]  . Obstructive sleep apnea    does not use cpap   . On home oxygen therapy    uses 2l when is going somewhere per patient   . PUD (peptic ulcer disease)    remote, reports f/u EGD about 8 years ago unremarkable   . Reactive airway disease    related to exposure to chemical during 9/11  . Sinusitis   . Vitamin D deficiency     Past Surgical History:  Procedure Laterality Date  . ASAD LT SHOULDER  12/2008   left shoulder  . AV FISTULA PLACEMENT Left 08/09/2016   Procedure: BRACHIOCEPHALIC ARTERIOVENOUS (AV) FISTULA CREATION LEFT ARM;  Surgeon: [name], MD;  Location: [facility];  Service: Vascular;  Laterality: Left;  . CATARACT EXTRACTION W/PHACO Left 03/28/2016   Procedure: CATARACT EXTRACTION PHACO AND INTRAOCULAR LENS PLACEMENT LEFT EYE;  Surgeon: [name], MD;  Location: [facility];  Service: Ophthalmology;  Laterality: Left;  CDE: 4.77  . CATARACT EXTRACTION W/PHACO Right 04/11/2016   Procedure: CATARACT EXTRACTION PHACO AND INTRAOCULAR LENS PLACEMENT RIGHT EYE; CDE:  4.74;  Surgeon: [name], MD;  Location: [facility];  Service: Ophthalmology;  Laterality: Right;  . COLONOSCOPY  10/2008   Fields: Rectal polyp  obliterated, not retrieved, hemorrhoids, single ascending colon diverticulum near the CV. Next colonoscopy April 2020  . COLONOSCOPY N/A 12/25/2014   SLF: 1. Colorectal polyps (2) removed 2. Small internal hemorrhoids 3. the left colon is severely redundant  . DOPPLER ECHOCARDIOGRAPHY    . ESOPHAGOGASTRODUODENOSCOPY N/A 12/25/2014   SLF: 1. Anemia most likely due to CRI, gastritis, gastric polyps 2. Moderate non-erosive gastriits and mild duodenitis.  3.TWo large gstric polyps removed.   Marland Kitchen EYE SURGERY  12/22/2010   tear duct probing-  . FOREIGN BODY REMOVAL  03/29/2011   Procedure: REMOVAL FOREIGN BODY EXTREMITY;  Surgeon: Arther Abbott, MD;  Location: [facility];  Service: Orthopedics;  Laterality: Right;  Removal Foreign Body Right Thumb  . IR FLUORO GUIDE CV  LINE RIGHT  08/06/2018  . IR US GUIDE VASC ACCESS RIGHT  08/06/2018  . KNEE ARTHROSCOPY  10/2007   left  . KNEE ARTHROSCOPY WITH LATERAL MENISECTOMY Right 10/14/2015   Procedure: LEFT KNEE ARTHROSCOPY WITH PARTIAL LATERAL MENISECTOMY;  Surgeon: Carole Civil, MD;  Location: [facility];  Service: Orthopedics;  Laterality: Right;  . LEFT HEART CATHETERIZATION WITH CORONARY ANGIOGRAM N/A 03/28/2013   Procedure: LEFT HEART CATHETERIZATION WITH CORONARY ANGIOGRAM;  Surgeon: Leonie Man, MD;  Location: [facility] CATH [facility];  Service: Cardiovascular;  Laterality: N/A;  . NM MYOVIEW LTD    . PENILE PROSTHESIS IMPLANT N/A 08/16/2015   Procedure: PENILE PROTHESIS INFLATABLE, three piece, Excisional biopsy of Penile ulcer, Penile molding;  Surgeon: Carolan Clines, MD;  Location: [facility];  Service: Urology;  Laterality: N/A;  . PENILE PROSTHESIS IMPLANT N/A 12/24/2017   Procedure: REMOVAL AND  REPLACEMENT  COLOPLAST PENILE PROSTHESIS;  Surgeon: Lucas Mallow, MD;  Location: [facility];  Service: Urology;  Laterality: N/A;  . QUADRICEPS TENDON REPAIR  07/21/2011   Procedure: REPAIR QUADRICEP TENDON;  Surgeon: Arther Abbott, MD;  Location: [facility];  Service: Orthopedics;  Laterality: Right;  . TOENAIL EXCISION     removed [id number]  . UMBILICAL HERNIA REPAIR  2007   [place]     Inpatient Medications: Scheduled Meds:  Continuous Infusions:  PRN Meds:   Allergies:    Allergies  Allergen Reactions  . Tramadol Hcl   . Opana [Oxymorphone Hcl] Itching  . Oxymorphone Itching  . Tramadol Itching    Social History:   Social History   Socioeconomic History  . Marital status: Married    Spouse name: Not on file  . Number of children: 2  . Years of education: 12th grade  . Highest education level: Not on file  Occupational History  . Occupation: disabled  . Occupation:      Employer: UNEMPLOYED  Social Needs  . Financial resource strain: Not on file  . Food insecurity:    Worry: Not on file      Inability: Not on file  . Transportation needs:    Medical: Not on file    Non-medical: Not on file  Tobacco Use  . Smoking status: Former Smoker    Packs/day: 1.00    Years: 25.00    Pack years: 25.00    Types: Cigarettes    Last attempt to quit: 03/27/2010    Years since quitting: 8.4  . Smokeless tobacco: Never Used  . Tobacco comment: Quit x 7 years  Substance and Sexual Activity  . Alcohol use: Yes    Alcohol/week: 0.0 standard drinks    Comment: occasionally  . Drug use: Yes    Types: Marijuana  Comment: cocaine- last time used- 11/24/2017 , marijuana-   . Sexual activity: Not on file  Lifestyle  . Physical activity:    Days per week: Not on file    Minutes per session: Not on file  . Stress: Not on file  Relationships  . Social connections:    Talks on phone: Not on file    Gets together: Not on file    Attends religious service: Not on file    Active member of club or organization: Not on file    Attends meetings of clubs or organizations: Not on file    Relationship status: Not on file  . Intimate partner violence:    Fear of current or ex partner: Not on file    Emotionally abused: Not on file    Physically abused: Not on file    Forced sexual activity: Not on file  Other Topics Concern  . Not on file  Social History Narrative   He quit smoking in 2010. 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 He developed pulmonary problems, became disabled because of lower airway disease in 2009.       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    Family History:    Family History  Problem Relation Age of Onset  . Hypertension Mother        MI  . Cancer Mother        breast   . Diabetes Mother   . Diabetes Father   . Hypertension Father   . Hypertension Sister   . Diabetes Sister   . Arthritis Other   . Asthma Other   . Lung disease Other   . Anesthesia problems Neg Hx   . Hypotension Neg Hx   . Malignant  hyperthermia Neg Hx   . Pseudochol deficiency Neg Hx   . Colon cancer Neg Hx      ROS:  Please see the history of present illness.   All other ROS reviewed and negative.     Physical Exam/Data:   Vitals:   08/29/18 1036  BP: (!) 148/86  Pulse: 69  Resp: 15  Temp: 98.5 F (36.9 C)  TempSrc: Oral  SpO2: 95%  Weight: 105.7 kg  Height: 5\' 4"  (1.626 m)   No intake or output data in the 24 hours ending 08/29/18 1216 Last 3 Weights 08/29/2018 08/16/2018 08/06/2018  Weight (lbs) 233 lb 242 lb 242 lb  Weight (kg) 105.688 kg 109.77 kg 109.77 kg     Body mass index is 39.99 kg/m.  General:  Well nourished, well developed, in no acute distress HEENT: normal Lymph: no adenopathy Neck: no JVD Endocrine:  No thryomegaly Cardiac:  normal S1, S2; RRR; 2/6 systolic murmur rusb, no jvd Lungs:  clear to auscultation bilaterally, no wheezing, rhonchi or rales  Abd: soft, nontender, no hepatomegaly  Ext: no edema Musculoskeletal:  No deformities, BUE and BLE strength normal and equal Skin: warm and dry  Neuro:  CNs 2-12 intact, no focal abnormalities noted Psych:  Normal affect     Laboratory Data:  Chemistry Recent Labs  Lab 08/29/18 1105  NA 141  K 4.1  CL 105  CO2 28  GLUCOSE 80  BUN 31*  CREATININE 7.10*  CALCIUM 8.7*  GFRNONAA 7*  GFRAA 8*  ANIONGAP 8    No results for input(s): PROT, ALBUMIN, AST, ALT, ALKPHOS, BILITOT in the last 168 hours. Hematology Recent Labs  Lab 08/29/18 1105  WBC 3.9*  RBC 3.36*  HGB 8.5*  HCT 29.4*  MCV 87.5  MCH 25.3*  MCHC 28.9*  RDW 17.9*  PLT 160   Cardiac Enzymes Recent Labs  Lab 08/29/18 1105  TROPONINI 0.05*   No results for input(s): TROPIPOC in the last 168 hours.  BNPNo results for input(s): BNP, PROBNP in the last 168 hours.  DDimer No results for input(s): DDIMER in the last 168 hours.  Radiology/Studies:  Dg Chest 2 View  Result Date: 08/29/2018 CLINICAL DATA:  Chest pain and sob since Tuesday. Hx of copd,  htn and diabetes. EXAM: CHEST - 2 VIEW COMPARISON:  09/28/2016 FINDINGS: Cardiac silhouette is top-normal in size. No mediastinal or hilar masses. There is no evidence of adenopathy. Clear lungs.  No pleural effusion or pneumothorax. Right internal jugular tunneled dual lumen central venous catheter is new since the prior exam, tip projecting in the lower superior vena cava. Skeletal structures are intact. IMPRESSION: No active cardiopulmonary disease. Electronically Signed   By: Lajean Manes M.D.   On: 08/29/2018 11:34    Assessment and Plan:   1. Chest pain - nonobstructive CAD by cath in 2014 - current symptoms fairly atypical. Very slight trop in setting of ESRD is not specific. EKG without acute ischemic changes - cycle enzymes, EKGs overnight. Plan for echo. 24 hr obs overnight - make npo at midnight     For questions or updates, please contact [place] Please consult [email or web address] for contact info under     Signed, Carlyle Dolly, MD  08/29/2018 12:16 PM

## 2018-08-29 NOTE — Progress Notes (Signed)
Has denied chest pain since admission.  Cardiac monitoring showing SR with first degree block

## 2018-08-29 NOTE — ED Notes (Signed)
Dr Branch at bedside. ?

## 2018-08-29 NOTE — ED Notes (Signed)
Pt returned from xray

## 2018-08-29 NOTE — ED Provider Notes (Signed)
[facility] Provider Note   CSN: [id number] Arrival date & time: 08/29/18  1013     History   Chief Complaint Chief Complaint  Patient presents with  . Chest Pain    HPI [name] [name] is a 68 y.o. male.  Patient states that started having sudden onset L sided chest pain on Tuesday. He states is intermittent, feels sharp, lasts for only a few minutes, nonradiating. He had another episode today about 1 hour into HD and was sent to ED for evaluation. No associated SOB, nausea, diaphoresis, lightheadedness, abdominal pain. Feels similar to chest pain he had about 12 years ago when he was doing cocaine at the time. Denies current cocaine use, last used cocaine 10 years ago. Does still make urine.      Past Medical History:  Diagnosis Date  . Anemia   . Arthritis   . Bell palsy   . CAD (coronary artery disease)    a. 2014 MV: abnl w/ infap ischemia; b. 03/2013 Cath: aneurysmal bleb in the LAD w/ otw nonobs dzs-->Med Rx.  . Chronic back pain   . Chronic knee pain    a. 09/2015 s/p R TKA.  Marland Kitchen Chronic pain   . Chronic shoulder pain   . Chronic sinusitis   . CKD (chronic kidney disease), stage IV ([place])    stage 4 per office visit note of Dr Lowanda Foster on 05/2015   . COPD (chronic obstructive pulmonary disease) ([place])   . Diabetes mellitus without complication ([place])    type II   . Essential hypertension   . GERD (gastroesophageal reflux disease)   . Gout   . Gout   . Hepatomegaly    noted on noncontrast CT 2015  . History of hiatal hernia   . Hyperlipidemia   . Lateral meniscus tear   . Obesity    [place]  . Obstructive sleep apnea    does not use cpap   . On home oxygen therapy    uses 2l when is going somewhere per patient   . PUD (peptic ulcer disease)    remote, reports f/u EGD about 8 years ago unremarkable   . Reactive airway disease    related to exposure to chemical during 9/11  . Sinusitis   . Vitamin D deficiency     Patient Active  Problem List   Diagnosis Date Noted  . Constipation 02/28/2018  . End stage renal disease ([place]) 11/13/2017  . Foul smelling urine 12/14/2016  . Coronary artery disease involving native coronary artery of native heart without angina pectoris 10/27/2016  . Chronic kidney disease, stage IV (severe) ([place]) 10/27/2016  . Morbid obesity due to excess calories ([place]) 03/22/2016  . CAD (coronary artery disease)   . Erectile dysfunction 08/16/2015  . Hemorrhoids 07/05/2015  . Vitamin D deficiency 06/04/2015  . OSA on CPAP 05/14/2015  . Normocytic anemia 03/24/2015  . Fatty liver 12/01/2014  . Back pain 06/05/2013  . OA (osteoarthritis) of knee 03/15/2011  . CTS (carpal tunnel syndrome) 03/15/2011  . Diabetes mellitus with stage 5 chronic kidney disease ([place]) 09/29/2008  . Mixed hyperlipidemia 09/29/2008  . Essential hypertension 09/29/2008  . GERD 09/29/2008    Past Surgical History:  Procedure Laterality Date  . ASAD LT SHOULDER  12/2008   left shoulder  . AV FISTULA PLACEMENT Left 08/09/2016   Procedure: BRACHIOCEPHALIC ARTERIOVENOUS (AV) FISTULA CREATION LEFT ARM;  Surgeon: [name], MD;  Location: [facility];  Service: Vascular;  Laterality:  Left;  . CATARACT EXTRACTION W/PHACO Left 03/28/2016   Procedure: CATARACT EXTRACTION PHACO AND INTRAOCULAR LENS PLACEMENT LEFT EYE;  Surgeon: [name], MD;  Location: [facility];  Service: Ophthalmology;  Laterality: Left;  CDE: 4.77  . CATARACT EXTRACTION W/PHACO Right 04/11/2016   Procedure: CATARACT EXTRACTION PHACO AND INTRAOCULAR LENS PLACEMENT RIGHT EYE; CDE:  4.74;  Surgeon: [name], MD;  Location: [facility];  Service: Ophthalmology;  Laterality: Right;  . COLONOSCOPY  10/2008   Fields: Rectal polyp obliterated, not retrieved, hemorrhoids, single ascending colon diverticulum near the CV. Next colonoscopy April 2020  . COLONOSCOPY N/A 12/25/2014   SLF: 1. Colorectal polyps (2) removed 2. Small internal hemorrhoids 3. the left colon is severely  redundant  . DOPPLER ECHOCARDIOGRAPHY    . ESOPHAGOGASTRODUODENOSCOPY N/A 12/25/2014   SLF: 1. Anemia most likely due to CRI, gastritis, gastric polyps 2. Moderate non-erosive gastriits and mild duodenitis.  3.TWo large gstric polyps removed.   Marland Kitchen EYE SURGERY  12/22/2010   tear duct probing-[place]  . FOREIGN BODY REMOVAL  03/29/2011   Procedure: REMOVAL FOREIGN BODY EXTREMITY;  Surgeon: Arther Abbott, MD;  Location: [facility];  Service: Orthopedics;  Laterality: Right;  Removal Foreign Body Right Thumb  . IR FLUORO GUIDE CV LINE RIGHT  08/06/2018  . IR US GUIDE VASC ACCESS RIGHT  08/06/2018  . KNEE ARTHROSCOPY  10/2007   left  . KNEE ARTHROSCOPY WITH LATERAL MENISECTOMY Right 10/14/2015   Procedure: LEFT KNEE ARTHROSCOPY WITH PARTIAL LATERAL MENISECTOMY;  Surgeon: Carole Civil, MD;  Location: [facility];  Service: Orthopedics;  Laterality: Right;  . LEFT HEART CATHETERIZATION WITH CORONARY ANGIOGRAM N/A 03/28/2013   Procedure: LEFT HEART CATHETERIZATION WITH CORONARY ANGIOGRAM;  Surgeon: Leonie Man, MD;  Location[facility] C[facility];  Service: Cardiovascular;  Laterality: N/A;  . NM MYOVIEW LTD    . PENILE PROSTHESIS IMPLANT N/A 08/16/2015   Procedure: PENILE PROTHESIS INFLATABLE, three piece, Excisional biopsy of Penile ulcer, Penile molding;  Surgeon: Carolan Clines, MD;  Location: [facility];  Service: Urology;  Laterality: N/A;  . PENILE PROSTHESIS IMPLANT N/A 12/24/2017   Procedure: REMOVAL AND  REPLACEMENT  COLOPLAST PENILE PROSTHESIS;  Surgeon: Lucas Mallow, MD;  Location: [facility];  Service: Urology;  Laterality: N/A;  . QUADRICEPS TENDON REPAIR  07/21/2011   Procedure: REPAIR QUADRICEP TENDON;  Surgeon: Arther Abbott, MD;  Location: [facility];  Service: Orthopedics;  Laterality: Right;  . TOENAIL EXCISION     removed [id number]  . UMBILICAL HERNIA REPAIR  2007   [place]        Home Medications    Prior to Admission medications   Medication Sig Start Date End Date Taking?  Authorizing Provider  acetaminophen (TYLENOL) 650 MG CR tablet Take 1,300 mg by mouth daily as needed for pain.    [provider]  allopurinol (ZYLOPRIM) 100 MG tablet Take 100 mg by mouth daily.    [provider]  amLODipine (NORVASC) 10 MG tablet Take 10 mg by mouth daily.     [provider]  aspirin EC 81 MG tablet Take 1 tablet (81 mg total) by mouth daily. 05/01/16   Hilty, Nadean Corwin, MD  budesonide-formoterol (SYMBICORT) 160-4.5 MCG/ACT inhaler Inhale 2 puffs into the lungs 2 (two) times daily as needed (Shortness of Breath).     [provider]  calcitRIOL (ROCALTROL) 0.25 MCG capsule Take 0.25 mcg by mouth daily.    [provider]  cetirizine (ZYRTEC) 10 MG tablet Take 10  mg by mouth at bedtime as needed for allergies.     [provider]  colchicine 0.6 MG tablet Take 0.5 tablets (0.3 mg total) by mouth daily. 12/09/17   Rolland Porter, MD  diclofenac sodium (VOLTAREN) 1 % GEL Apply 1 application topically 2 (two) times daily as needed (muscle pain).    [provider]  epoetin alfa (EPOGEN,PROCRIT) 4000 UNIT/ML injection Inject 8,000 Units into the skin as needed (Given if hgb < 10).     [provider]  hydrALAZINE (APRESOLINE) 25 MG tablet Take 25 mg by mouth 3 (three) times daily. 06/29/16   [provider]  HYDROcodone-acetaminophen (NORCO/VICODIN) 5-325 MG tablet Take 1 tablet by mouth every 6 (six) hours as needed for severe pain. Patient not taking: Reported on 08/16/2018 04/17/18   Margette Fast, MD  Insulin Glargine (LANTUS SOLOSTAR) 100 UNIT/ML Solostar Pen INJECT 30 UNITS SUBCUTANEOUSLY AT BEDTIME. 04/01/18   Nida, Marella Chimes, MD  Insulin Pen Needle (B-D UF III MINI PEN NEEDLES) 31G X 5 MM MISC INJECT SUBCUTANEOUSLY TWICE DAILY AS DIRECTED 03/21/18   Nida, Marella Chimes, MD  mometasone (NASONEX) 50 MCG/ACT nasal spray Place 2 sprays into the nose 2 (two) times daily as needed (allergies).      [provider]  pantoprazole (PROTONIX) 40 MG tablet TAKE 1 TABLET TWICE DAILY 30 MINUTES PRIOR TO MEALS Patient taking differently: daily. TAKE 1 TABLET TWICE DAILY 30 MINUTES PRIOR TO MEALS 02/28/18   Carlis Stable, NP  predniSONE (STERAPRED UNI-PAK 21 TAB) 10 MG (21) TBPK tablet Take by mouth daily. Take 6 tabs by mouth daily  for 2 days, then 5 tabs for 2 days, then 4 tabs for 2 days, then 3 tabs for 2 days, 2 tabs for 2 days, then 1 tab by mouth daily for 2 days 04/17/18   Long, Wonda Olds, MD  simvastatin (ZOCOR) 20 MG tablet Take 20 mg by mouth every morning.     [provider]  TRADJENTA 5 MG TABS tablet TAKE 1 TABLET EVERY DAY 05/17/18   Cassandria Anger, MD    Family History Family History  Problem Relation Age of Onset  . Hypertension Mother        MI  . Cancer Mother        breast   . Diabetes Mother   . Diabetes Father   . Hypertension Father   . Hypertension Sister   . Diabetes Sister   . Arthritis Other   . Asthma Other   . Lung disease Other   . Anesthesia problems Neg Hx   . Hypotension Neg Hx   . Malignant hyperthermia Neg Hx   . Pseudochol deficiency Neg Hx   . Colon cancer Neg Hx     Social History Social History   Tobacco Use  . Smoking status: Former Smoker    Packs/day: 1.00    Years: 25.00    Pack years: 25.00    Types: Cigarettes    Last attempt to quit: 03/27/2010    Years since quitting: 8.4  . Smokeless tobacco: Never Used  . Tobacco comment: Quit x 7 years  Substance Use Topics  . Alcohol use: Yes    Alcohol/week: 0.0 standard drinks    Comment: occasionally  . Drug use: Yes    Types: Marijuana    Comment: cocaine- last time used- 11/24/2017 , marijuana-      Allergies   Tramadol hcl; Opana [oxymorphone hcl]; Oxymorphone; and Tramadol   Review  of Systems Review of Systems  Constitutional: Negative for chills and fever.  HENT: Negative for congestion, rhinorrhea and sore throat.   Respiratory: Negative for  cough, shortness of breath and wheezing.   Cardiovascular: Positive for chest pain. Negative for palpitations.  Gastrointestinal: Negative for abdominal pain, diarrhea, nausea and vomiting.  Genitourinary: Negative for dysuria.  Neurological: Negative for light-headedness.     Physical Exam Updated Vital Signs BP (!) 148/86 (BP Location: Right Arm)   Pulse 69   Temp 98.5 F (36.9 C) (Oral)   Resp 15   Ht 5\' 4"  (1.626 m)   Wt 105.7 kg   SpO2 95%   BMI 39.99 kg/m   Physical Exam Constitutional:      General: He is not in acute distress.    Appearance: He is well-developed. He is obese. He is not ill-appearing.  HENT:     Head: Normocephalic and atraumatic.  Neck:     Musculoskeletal: Normal range of motion.  Cardiovascular:     Rate and Rhythm: Normal rate and regular rhythm.     Heart sounds: Normal heart sounds. No murmur. No gallop.   Pulmonary:     Effort: Pulmonary effort is normal.     Breath sounds: Normal breath sounds.  Abdominal:     General: Bowel sounds are normal.     Palpations: Abdomen is soft.     Tenderness: There is no abdominal tenderness. There is no guarding or rebound.  Musculoskeletal: Normal range of motion.     Right lower leg: No edema.     Left lower leg: No edema.  Neurological:     General: No focal deficit present.     Mental Status: He is alert.  Psychiatric:        Mood and Affect: Mood normal.      ED Treatments / Results  Labs (all labs ordered are listed, but only abnormal results are displayed) Labs Reviewed  BASIC METABOLIC PANEL - Abnormal; Notable for the following components:      Result Value   BUN 31 (*)    Creatinine, Ser 7.10 (*)    Calcium 8.7 (*)    GFR calc non Af Amer 7 (*)    GFR calc Af Amer 8 (*)    All other components within normal limits  CBC - Abnormal; Notable for the following components:   WBC 3.9 (*)    RBC 3.36 (*)    Hemoglobin 8.5 (*)    HCT 29.4 (*)    MCH 25.3 (*)    MCHC 28.9 (*)     RDW 17.9 (*)    All other components within normal limits  TROPONIN I - Abnormal; Notable for the following components:   Troponin I 0.05 (*)    All other components within normal limits  RAPID URINE DRUG SCREEN, HOSP PERFORMED    EKG EKG Interpretation  Date/Time:  Thursday August 29 2018 10:39:31 EST Ventricular Rate:  70 PR Interval:  248 QRS Duration: 100 QT Interval:  402 QTC Calculation: 434 R Axis:   -33 Text Interpretation:  Sinus rhythm with 1st degree A-V block Left axis deviation Nonspecific T wave abnormality Abnormal ECG Confirmed by Elnora Morrison [phone]) on 08/29/2018 11:33:05 AM   Radiology Dg Chest 2 View  Result Date: 08/29/2018 CLINICAL DATA:  Chest pain and sob since Tuesday. Hx of copd, htn and diabetes. EXAM: CHEST - 2 VIEW COMPARISON:  09/28/2016 FINDINGS: Cardiac silhouette is top-normal in size. No mediastinal or hilar  masses. There is no evidence of adenopathy. Clear lungs.  No pleural effusion or pneumothorax. Right internal jugular tunneled dual lumen central venous catheter is new since the prior exam, tip projecting in the lower superior vena cava. Skeletal structures are intact. IMPRESSION: No active cardiopulmonary disease. Electronically Signed   By: Lajean Manes M.D.   On: 08/29/2018 11:34    Procedures Procedures (including critical care time)  Medications Ordered in ED Medications - No data to display   Initial Impression / Assessment and Plan / ED Course  I have reviewed the triage vital signs and the nursing notes.  Pertinent labs & imaging results that were available during my care of the patient were reviewed by me and considered in my medical decision making (see chart for details).     Patient here with chest pain intermittent since Tuesday, slightly suspicious history but worsened during HD today which is concerning. HEART score 5 given risk factors and elevated troponin. No EKG changes.   12:07pm Hospitalist consulted, concern  for ACS recommended cardiology consult to determine CP obs here vs transfer to [facility].   12:35pm Cardiology recommending observation here, no need for [facility] transfer  12:52pm Hospitalist accepted for admission.    Final Clinical Impressions(s) / ED Diagnoses   Final diagnoses:  Chest pain, unspecified type    ED Discharge Orders    None       Bufford Lope, DO 08/29/18 1253    Elnora Morrison, MD 08/29/18 1622

## 2018-08-29 NOTE — Progress Notes (Signed)
Patient B/P 155/91 MD notifed.

## 2018-08-29 NOTE — Progress Notes (Signed)
*  PRELIMINARY RESULTS* Echocardiogram 2D Echocardiogram has been performed.  Leavy Cella 08/29/2018, 2:58 PM

## 2018-08-29 NOTE — H&P (Signed)
History and Physical    [name] [name] HER:[id number] DOB: 16-Oct-1950 DOA: 08/29/2018  PCP: [name], MD  Cardiologist: Dr. Debara Pickett  Patient coming from: Hemodialysis  Chief Complaint: Chest pain  HPI: [name] [name] is a 68 y.o. male with medical history significant for nonobstructive CAD by cath in 2014, ESRD on HD, type 2 diabetes, hypertension, chronic pain, COPD, and obesity who presents to the ED with intermittent left-sided chest pain that was noted to be sharp in nature that began on Tuesday of this week.  The pain would last for approximately a few seconds and then disappear on its own.  He has had no other diaphoresis, shortness of breath, lower extremity edema, orthopnea, or associated symptoms.  He was noticed to have an episode approximately 1 hour into hemodialysis today for which she was sent to the ED.   ED Course: Vital signs are noted to be stable.  Laboratory data reveals elevated BUN and creatinine that is usual for his end-stage renal disease.  Troponin is 0.05 and EKG with nonspecific changes.  Chest x-ray with no acute findings noted.  Review of Systems: All others reviewed and otherwise negative.  Past Medical History:  Diagnosis Date  . Anemia   . Arthritis   . Bell palsy   . CAD (coronary artery disease)    a. 2014 MV: abnl w/ infap ischemia; b. 03/2013 Cath: aneurysmal bleb in the LAD w/ otw nonobs dzs-->Med Rx.  . Chronic back pain   . Chronic knee pain    a. 09/2015 s/p R TKA.  Marland Kitchen Chronic pain   . Chronic shoulder pain   . Chronic sinusitis   . CKD (chronic kidney disease), stage IV ([place])    stage 4 per office visit note of Dr Lowanda Foster on 05/2015   . COPD (chronic obstructive pulmonary disease) ([place])   . Diabetes mellitus without complication ([place])    type II   . Essential hypertension   . GERD (gastroesophageal reflux disease)   . Gout   . Gout   . Hepatomegaly    noted on noncontrast CT 2015  . History of hiatal hernia   . Hyperlipidemia   .  Lateral meniscus tear   . Obesity    [place]  . Obstructive sleep apnea    does not use cpap   . On home oxygen therapy    uses 2l when is going somewhere per patient   . PUD (peptic ulcer disease)    remote, reports f/u EGD about 8 years ago unremarkable   . Reactive airway disease    related to exposure to chemical during 9/11  . Sinusitis   . Vitamin D deficiency     Past Surgical History:  Procedure Laterality Date  . ASAD LT SHOULDER  12/2008   left shoulder  . AV FISTULA PLACEMENT Left 08/09/2016   Procedure: BRACHIOCEPHALIC ARTERIOVENOUS (AV) FISTULA CREATION LEFT ARM;  Surgeon: [name], MD;  Location: [facility];  Service: Vascular;  Laterality: Left;  . CATARACT EXTRACTION W/PHACO Left 03/28/2016   Procedure: CATARACT EXTRACTION PHACO AND INTRAOCULAR LENS PLACEMENT LEFT EYE;  Surgeon: [name], MD;  Location: [facility];  Service: Ophthalmology;  Laterality: Left;  CDE: 4.77  . CATARACT EXTRACTION W/PHACO Right 04/11/2016   Procedure: CATARACT EXTRACTION PHACO AND INTRAOCULAR LENS PLACEMENT RIGHT EYE; CDE:  4.74;  Surgeon: [name], MD;  Location: [facility];  Service: Ophthalmology;  Laterality: Right;  . COLONOSCOPY  10/2008   Fields: Rectal polyp obliterated,  not retrieved, hemorrhoids, single ascending colon diverticulum near the CV. Next colonoscopy April 2020  . COLONOSCOPY N/A 12/25/2014   SLF: 1. Colorectal polyps (2) removed 2. Small internal hemorrhoids 3. the left colon is severely redundant  . DOPPLER ECHOCARDIOGRAPHY    . ESOPHAGOGASTRODUODENOSCOPY N/A 12/25/2014   SLF: 1. Anemia most likely due to CRI, gastritis, gastric polyps 2. Moderate non-erosive gastriits and mild duodenitis.  3.TWo large gstric polyps removed.   Marland Kitchen EYE SURGERY  12/22/2010   tear duct probing-[place]  . FOREIGN BODY REMOVAL  03/29/2011   Procedure: REMOVAL FOREIGN BODY EXTREMITY;  Surgeon: Arther Abbott, MD;  Location: [facility];  Service: Orthopedics;  Laterality: Right;  Removal Foreign  Body Right Thumb  . IR FLUORO GUIDE CV LINE RIGHT  08/06/2018  . IR US GUIDE VASC ACCESS RIGHT  08/06/2018  . KNEE ARTHROSCOPY  10/2007   left  . KNEE ARTHROSCOPY WITH LATERAL MENISECTOMY Right 10/14/2015   Procedure: LEFT KNEE ARTHROSCOPY WITH PARTIAL LATERAL MENISECTOMY;  Surgeon: Carole Civil, MD;  Location: [facility];  Service: Orthopedics;  Laterality: Right;  . LEFT HEART CATHETERIZATION WITH CORONARY ANGIOGRAM N/A 03/28/2013   Procedure: LEFT HEART CATHETERIZATION WITH CORONARY ANGIOGRAM;  Surgeon: Leonie Man, MD;  Location: [facility] C[facility];  Service: Cardiovascular;  Laterality: N/A;  . NM MYOVIEW LTD    . PENILE PROSTHESIS IMPLANT N/A 08/16/2015   Procedure: PENILE PROTHESIS INFLATABLE, three piece, Excisional biopsy of Penile ulcer, Penile molding;  Surgeon: Carolan Clines, MD;  Location: [facility];  Service: Urology;  Laterality: N/A;  . PENILE PROSTHESIS IMPLANT N/A 12/24/2017   Procedure: REMOVAL AND  REPLACEMENT  COLOPLAST PENILE PROSTHESIS;  Surgeon: Lucas Mallow, MD;  Location: [facility];  Service: Urology;  Laterality: N/A;  . QUADRICEPS TENDON REPAIR  07/21/2011   Procedure: REPAIR QUADRICEP TENDON;  Surgeon: Arther Abbott, MD;  Location: [facility];  Service: Orthopedics;  Laterality: Right;  . TOENAIL EXCISION     removed [id number]  . UMBILICAL HERNIA REPAIR  2007   [place]     reports that he quit smoking about 8 years ago. His smoking use included cigarettes. He has a 25.00 pack-year smoking history. He has never used smokeless tobacco. He reports current alcohol use. He reports current drug use. Drug: Marijuana.  Allergies  Allergen Reactions  . Tramadol Hcl   . Opana [Oxymorphone Hcl] Itching  . Oxymorphone Itching  . Tramadol Itching    Family History  Problem Relation Age of Onset  . Hypertension Mother        MI  . Cancer Mother        breast   . Diabetes Mother   . Diabetes Father   . Hypertension Father   . Hypertension Sister   . Diabetes  Sister   . Arthritis Other   . Asthma Other   . Lung disease Other   . Anesthesia problems Neg Hx   . Hypotension Neg Hx   . Malignant hyperthermia Neg Hx   . Pseudochol deficiency Neg Hx   . Colon cancer Neg Hx     Prior to Admission medications   Medication Sig Start Date End Date Taking? Authorizing Provider  acetaminophen (TYLENOL) 650 MG CR tablet Take 1,300 mg by mouth daily as needed for pain.   Yes [provider]  allopurinol (ZYLOPRIM) 100 MG tablet Take 100 mg by mouth daily.   Yes [provider]  aspirin EC 81 MG tablet Take 1 tablet (81 mg total)  by mouth daily. 05/01/16  Yes Hilty, Nadean Corwin, MD  budesonide-formoterol (SYMBICORT) 160-4.5 MCG/ACT inhaler Inhale 2 puffs into the lungs 2 (two) times daily as needed (Shortness of Breath).    Yes [provider]  calcitRIOL (ROCALTROL) 0.25 MCG capsule Take 0.25 mcg by mouth daily.   Yes [provider]  cetirizine (ZYRTEC) 10 MG tablet Take 10 mg by mouth at bedtime as needed for allergies.    Yes [provider]  diclofenac sodium (VOLTAREN) 1 % GEL Apply 1 application topically 2 (two) times daily as needed (muscle pain).   Yes [provider]  epoetin alfa (EPOGEN,PROCRIT) 4000 UNIT/ML injection Inject 8,000 Units into the skin as needed (Given if hgb < 10).    Yes [provider]  folic acid (FOLVITE) 1 MG tablet Take 1 tablet by mouth daily. 07/29/18  Yes [provider]  Insulin Glargine (LANTUS SOLOSTAR) 100 UNIT/ML Solostar Pen INJECT 30 UNITS SUBCUTANEOUSLY AT BEDTIME. 04/01/18  Yes Nida, Marella Chimes, MD  Insulin Pen Needle (B-D UF III MINI PEN NEEDLES) 31G X 5 MM MISC INJECT SUBCUTANEOUSLY TWICE DAILY AS DIRECTED 03/21/18  Yes Nida, Marella Chimes, MD  lidocaine-prilocaine (EMLA) cream Apply 1 application topically daily as needed. 07/19/18  Yes [provider]  mometasone (NASONEX) 50 MCG/ACT nasal spray Place 2 sprays into the nose 2 (two)  times daily as needed (allergies).    Yes [provider]  pantoprazole (PROTONIX) 40 MG tablet TAKE 1 TABLET TWICE DAILY 30 MINUTES PRIOR TO MEALS Patient taking differently: daily. TAKE 1 TABLET TWICE DAILY 30 MINUTES PRIOR TO MEALS 02/28/18  Yes Carlis Stable, NP  simvastatin (ZOCOR) 20 MG tablet Take 20 mg by mouth every morning.    Yes [provider]  TRADJENTA 5 MG TABS tablet TAKE 1 TABLET EVERY DAY 05/17/18  Yes Cassandria Anger, MD    Physical Exam: Vitals:   08/29/18 1036 08/29/18 1100 08/29/18 1300  BP: (!) 148/86 129/84 (!) 148/83  Pulse: 69 68 66  Resp: 15 17 (!) 23  Temp: 98.5 F (36.9 C)    TempSrc: Oral    SpO2: 95% 97% 98%  Weight: 105.7 kg    Height: 5\' 4"  (1.626 m)      Constitutional: NAD, calm, comfortable Vitals:   08/29/18 1036 08/29/18 1100 08/29/18 1300  BP: (!) 148/86 129/84 (!) 148/83  Pulse: 69 68 66  Resp: 15 17 (!) 23  Temp: 98.5 F (36.9 C)    TempSrc: Oral    SpO2: 95% 97% 98%  Weight: 105.7 kg    Height: 5\' 4"  (1.626 m)     Eyes: lids and conjunctivae normal ENMT: Mucous membranes are moist.  Neck: normal, supple Respiratory: clear to auscultation bilaterally. Normal respiratory effort. No accessory muscle use.  Cardiovascular: Regular rate and rhythm, no murmurs. No extremity edema. Abdomen: no tenderness, no distention. Bowel sounds positive.  Musculoskeletal:  No joint deformity upper and lower extremities.   Skin: no rashes, lesions, ulcers.  Psychiatric: Normal judgment and insight. Alert and oriented x 3. Normal mood.   Labs on Admission: I have personally reviewed following labs and imaging studies  CBC: Recent Labs  Lab 08/29/18 1105  WBC 3.9*  HGB 8.5*  HCT 29.4*  MCV 87.5  PLT 361   Basic Metabolic Panel: Recent Labs  Lab 08/29/18 1105  NA 141  K 4.1  CL 105  CO2 28  GLUCOSE 80  BUN 31*  CREATININE 7.10*  CALCIUM 8.7*  GFR: Estimated Creatinine Clearance: 11 mL/min (A) (by C-G  formula based on SCr of 7.1 mg/dL (H)). Liver Function Tests: No results for input(s): AST, ALT, ALKPHOS, BILITOT, PROT, ALBUMIN in the last 168 hours. No results for input(s): LIPASE, AMYLASE in the last 168 hours. No results for input(s): AMMONIA in the last 168 hours. Coagulation Profile: No results for input(s): INR, PROTIME in the last 168 hours. Cardiac Enzymes: Recent Labs  Lab 08/29/18 1105  TROPONINI 0.05*   BNP (last 3 results) No results for input(s): PROBNP in the last 8760 hours. HbA1C: No results for input(s): HGBA1C in the last 72 hours. CBG: No results for input(s): GLUCAP in the last 168 hours. Lipid Profile: No results for input(s): CHOL, HDL, LDLCALC, TRIG, CHOLHDL, LDLDIRECT in the last 72 hours. Thyroid Function Tests: No results for input(s): TSH, T4TOTAL, FREET4, T3FREE, THYROIDAB in the last 72 hours. Anemia Panel: No results for input(s): VITAMINB12, FOLATE, FERRITIN, TIBC, IRON, RETICCTPCT in the last 72 hours. Urine analysis:    Component Value Date/Time   COLORURINE YELLOW 01/15/2014 2320   APPEARANCEUR CLEAR 01/15/2014 2320   LABSPEC 1.010 01/15/2014 2320   PHURINE 5.5 01/15/2014 2320   GLUCOSEU NEGATIVE 01/15/2014 2320   HGBUR TRACE (A) 01/15/2014 2320   BILIRUBINUR NEGATIVE 01/15/2014 2320   KETONESUR NEGATIVE 01/15/2014 2320   PROTEINUR 30 (A) 01/15/2014 2320   UROBILINOGEN 0.2 01/15/2014 2320   NITRITE NEGATIVE 01/15/2014 2320   LEUKOCYTESUR NEGATIVE 01/15/2014 2320    Radiological Exams on Admission: Dg Chest 2 View  Result Date: 08/29/2018 CLINICAL DATA:  Chest pain and sob since Tuesday. Hx of copd, htn and diabetes. EXAM: CHEST - 2 VIEW COMPARISON:  09/28/2016 FINDINGS: Cardiac silhouette is top-normal in size. No mediastinal or hilar masses. There is no evidence of adenopathy. Clear lungs.  No pleural effusion or pneumothorax. Right internal jugular tunneled dual lumen central venous catheter is new since the prior exam, tip  projecting in the lower superior vena cava. Skeletal structures are intact. IMPRESSION: No active cardiopulmonary disease. Electronically Signed   By: Lajean Manes M.D.   On: 08/29/2018 11:34    EKG: Independently reviewed.  Sinus rhythm with first-degree AV block 70 bpm.  LAD.  Assessment/Plan Principal Problem:   Chest pain Active Problems:   Diabetes mellitus with stage 5 chronic kidney disease (HCC)   Essential hypertension   CAD (coronary artery disease)   Coronary artery disease involving native coronary artery of native heart without angina pectoris    1. Atypical chest pain in the setting of nonobstructive CAD.  Appreciate cardiology evaluation with recommendation for 2D echocardiogram and cycling of enzymes.  Continue to monitor on telemetry and plan for n.p.o. overnight in case stress testing is needed.  Maintain on low-dose aspirin as well as statin and avoid beta-blocker for now due to heart rate in the 60 bpm range.  Will order nitroglycerin as needed for chest pain. 2. ESRD on HD Tuesday, Thursday, Saturday.  Incomplete hemodialysis noted today with only 1 hour in.  Will consult nephrology for further evaluation management. 3. Type 2 diabetes.  Will use half dose of home Lantus at this time and maintain on SSI. 4. Essential hypertension.  Currently well controlled.  Will use hydralazine as needed.   DVT prophylaxis: Heparin Code Status: Full Family Communication: None at bedside Disposition Plan: Admit for ACS evaluation Consults called: Cardiology, nephrology Admission status: Observation, telemetry   Demitrius Crass Darleen Crocker DO [facility] Pager [phone]  If 7PM-7AM, please contact  night-coverage [email or web address] Password [email or web address]  08/29/2018, 1:12 PM

## 2018-08-30 ENCOUNTER — Observation Stay (HOSPITAL_COMMUNITY): Payer: Medicare Other

## 2018-08-30 ENCOUNTER — Encounter (HOSPITAL_COMMUNITY): Payer: Self-pay

## 2018-08-30 DIAGNOSIS — N186 End stage renal disease: Secondary | ICD-10-CM | POA: Diagnosis not present

## 2018-08-30 DIAGNOSIS — N2581 Secondary hyperparathyroidism of renal origin: Secondary | ICD-10-CM | POA: Diagnosis not present

## 2018-08-30 DIAGNOSIS — R079 Chest pain, unspecified: Secondary | ICD-10-CM | POA: Diagnosis not present

## 2018-08-30 DIAGNOSIS — Z992 Dependence on renal dialysis: Secondary | ICD-10-CM | POA: Diagnosis not present

## 2018-08-30 DIAGNOSIS — E1122 Type 2 diabetes mellitus with diabetic chronic kidney disease: Secondary | ICD-10-CM | POA: Diagnosis not present

## 2018-08-30 DIAGNOSIS — E875 Hyperkalemia: Secondary | ICD-10-CM | POA: Diagnosis not present

## 2018-08-30 DIAGNOSIS — I120 Hypertensive chronic kidney disease with stage 5 chronic kidney disease or end stage renal disease: Secondary | ICD-10-CM | POA: Diagnosis not present

## 2018-08-30 DIAGNOSIS — R0789 Other chest pain: Secondary | ICD-10-CM | POA: Diagnosis not present

## 2018-08-30 LAB — CBC
HCT: 30.2 % — ABNORMAL LOW (ref 39.0–52.0)
Hemoglobin: 8.7 g/dL — ABNORMAL LOW (ref 13.0–17.0)
MCH: 25.3 pg — ABNORMAL LOW (ref 26.0–34.0)
MCHC: 28.8 g/dL — ABNORMAL LOW (ref 30.0–36.0)
MCV: 87.8 fL (ref 80.0–100.0)
Platelets: 168 10*3/uL (ref 150–400)
RBC: 3.44 MIL/uL — ABNORMAL LOW (ref 4.22–5.81)
RDW: 17.7 % — ABNORMAL HIGH (ref 11.5–15.5)
WBC: 3.6 10*3/uL — ABNORMAL LOW (ref 4.0–10.5)
nRBC: 0 % (ref 0.0–0.2)

## 2018-08-30 LAB — BASIC METABOLIC PANEL
Anion gap: 12 (ref 5–15)
BUN: 42 mg/dL — ABNORMAL HIGH (ref 8–23)
CO2: 23 mmol/L (ref 22–32)
Calcium: 8.9 mg/dL (ref 8.9–10.3)
Chloride: 106 mmol/L (ref 98–111)
Creatinine, Ser: 7.79 mg/dL — ABNORMAL HIGH (ref 0.61–1.24)
GFR calc Af Amer: 7 mL/min — ABNORMAL LOW (ref >60)
GFR calc non Af Amer: 6 mL/min — ABNORMAL LOW (ref >60)
Glucose, Bld: 123 mg/dL — ABNORMAL HIGH (ref 70–99)
Potassium: 4.1 mmol/L (ref 3.5–5.1)
Sodium: 141 mmol/L (ref 135–145)

## 2018-08-30 LAB — NM MYOCAR MULTI W/SPECT W/WALL MOTION / EF
CHL CUP RESTING HR STRESS: 70 {beats}/min
LHR: 0.34
LV dias vol: 174 mL (ref 62–150)
LV sys vol: 78 mL
NUC STRESS TID: 0.92
Peak HR: 86 {beats}/min
SDS: 2
SRS: 2
SSS: 4

## 2018-08-30 LAB — TROPONIN I
Troponin I: 0.04 ng/mL (ref <0.03)
Troponin I: 0.04 ng/mL (ref <0.03)

## 2018-08-30 LAB — GLUCOSE, CAPILLARY
GLUCOSE-CAPILLARY: 115 mg/dL — AB (ref 70–99)
Glucose-Capillary: 99 mg/dL (ref 70–99)

## 2018-08-30 LAB — HIV ANTIBODY (ROUTINE TESTING W REFLEX): HIV Screen 4th Generation wRfx: NONREACTIVE

## 2018-08-30 MED ORDER — DARBEPOETIN ALFA 60 MCG/0.3ML IJ SOSY: 60.0000 ug | PREFILLED_SYRINGE | INTRAMUSCULAR | Status: DC

## 2018-08-30 MED ORDER — CHLORHEXIDINE GLUCONATE CLOTH 2 % EX PADS
6.0000 | MEDICATED_PAD | Freq: Every day | CUTANEOUS | Status: DC
  Administered 2018-08-30: 6 via TOPICAL

## 2018-08-30 MED ORDER — REGADENOSON 0.4 MG/5ML IV SOLN
INTRAVENOUS | Status: AC
  Administered 2018-08-30: 0.4 mg via INTRAVENOUS
  Filled 2018-08-30: qty 5

## 2018-08-30 MED ORDER — TECHNETIUM TC 99M TETROFOSMIN IV KIT
10.0000 | PACK | Freq: Once | INTRAVENOUS | Status: AC | PRN
  Administered 2018-08-30: 10.2 via INTRAVENOUS

## 2018-08-30 MED ORDER — SODIUM CHLORIDE 0.9% FLUSH
INTRAVENOUS | Status: AC
  Administered 2018-08-30: 10 mL via INTRAVENOUS
  Filled 2018-08-30: qty 10

## 2018-08-30 MED ORDER — TECHNETIUM TC 99M TETROFOSMIN IV KIT
30.0000 | PACK | Freq: Once | INTRAVENOUS | Status: AC | PRN
  Administered 2018-08-30: 30.7 via INTRAVENOUS

## 2018-08-30 NOTE — Progress Notes (Signed)
Discharge instructions reviewed with patient. Patient verbalized understanding of instructions. Patient discharged home in stable condition.  

## 2018-08-30 NOTE — Consult Note (Signed)
Referring Provider: No ref. provider found Primary Care Physician:  [name], MD Primary Nephrologist:  Dr. Burnett Sheng Reason for Consultation: Medical management end-stage renal disease, anemia, secondary hyperparathyroidism and maintenance of euvolemic  state  HPI: This is a very nice 68 year old gentleman who has a history of nonobstructive coronary artery disease by cardiac catheterization in 2014.  He is hemodialysis dependent and dialyzes at [facility] dialysis unit Tuesday Thursday Saturday.  He has a history of diabetes mellitus type 2 hypertension, chronic pain, COPD and obesity.  He presented to the emergency room after having received approximately 1 hour of hemodialysis with chest pain that lasted several seconds but then dissipated by itself.  He has been evaluated by Dr. Harl Bowie for cardiology, he has an ejection fraction of 50 to 55% with severe LVH a troponin peak of 0.05 he was positive for cocaine and is to be evaluated with a stress test later today.  Blood pressure 157/91 pulse 68 temperature 98.3 O2 sats 100% room air  Sodium 141 potassium 4.1 chloride 106 CO2 23 BUN 42 creatinine 7.79 glucose 123 calcium 8.9 WBC 3.6 hemoglobin 8.7 platelets 168  Chest x-ray was stable with no active cardiopulmonary disease 08/29/2018  Past Medical History:  Diagnosis Date  . Anemia   . Arthritis   . Bell palsy   . CAD (coronary artery disease)    a. 2014 MV: abnl w/ infap ischemia; b. 03/2013 Cath: aneurysmal bleb in the LAD w/ otw nonobs dzs-->Med Rx.  . Chronic back pain   . Chronic knee pain    a. 09/2015 s/p R TKA.  Marland Kitchen Chronic pain   . Chronic shoulder pain   . Chronic sinusitis   . CKD (chronic kidney disease), stage IV ([place])    stage 4 per office visit note of Dr Lowanda Foster on 05/2015   . COPD (chronic obstructive pulmonary disease) ([place])   . Diabetes mellitus without complication ([place])    type II   . Essential hypertension   . GERD (gastroesophageal reflux disease)    . Gout   . Gout   . Hepatomegaly    noted on noncontrast CT 2015  . History of hiatal hernia   . Hyperlipidemia   . Lateral meniscus tear   . Obesity    [place]  . Obstructive sleep apnea    does not use cpap   . On home oxygen therapy    uses 2l when is going somewhere per patient   . PUD (peptic ulcer disease)    remote, reports f/u EGD about 8 years ago unremarkable   . Reactive airway disease    related to exposure to chemical during 9/11  . Sinusitis   . Vitamin D deficiency     Past Surgical History:  Procedure Laterality Date  . ASAD LT SHOULDER  12/2008   left shoulder  . AV FISTULA PLACEMENT Left 08/09/2016   Procedure: BRACHIOCEPHALIC ARTERIOVENOUS (AV) FISTULA CREATION LEFT ARM;  Surgeon: [name], MD;  Location: [facility];  Service: Vascular;  Laterality: Left;  . CATARACT EXTRACTION W/PHACO Left 03/28/2016   Procedure: CATARACT EXTRACTION PHACO AND INTRAOCULAR LENS PLACEMENT LEFT EYE;  Surgeon: [name], MD;  Location: [facility];  Service: Ophthalmology;  Laterality: Left;  CDE: 4.77  . CATARACT EXTRACTION W/PHACO Right 04/11/2016   Procedure: CATARACT EXTRACTION PHACO AND INTRAOCULAR LENS PLACEMENT RIGHT EYE; CDE:  4.74;  Surgeon: [name], MD;  Location: [facility];  Service: Ophthalmology;  Laterality: Right;  . COLONOSCOPY  10/2008  Fields: Rectal polyp obliterated, not retrieved, hemorrhoids, single ascending colon diverticulum near the CV. Next colonoscopy April 2020  . COLONOSCOPY N/A 12/25/2014   SLF: 1. Colorectal polyps (2) removed 2. Small internal hemorrhoids 3. the left colon is severely redundant  . DOPPLER ECHOCARDIOGRAPHY    . ESOPHAGOGASTRODUODENOSCOPY N/A 12/25/2014   SLF: 1. Anemia most likely due to CRI, gastritis, gastric polyps 2. Moderate non-erosive gastriits and mild duodenitis.  3.TWo large gstric polyps removed.   Marland Kitchen EYE SURGERY  12/22/2010   tear duct probing-[place]  . FOREIGN BODY REMOVAL  03/29/2011   Procedure: REMOVAL FOREIGN BODY  EXTREMITY;  Surgeon: Arther Abbott, MD;  Location: [facility];  Service: Orthopedics;  Laterality: Right;  Removal Foreign Body Right Thumb  . IR FLUORO GUIDE CV LINE RIGHT  08/06/2018  . IR US GUIDE VASC ACCESS RIGHT  08/06/2018  . KNEE ARTHROSCOPY  10/2007   left  . KNEE ARTHROSCOPY WITH LATERAL MENISECTOMY Right 10/14/2015   Procedure: LEFT KNEE ARTHROSCOPY WITH PARTIAL LATERAL MENISECTOMY;  Surgeon: Carole Civil, MD;  Location: [facility];  Service: Orthopedics;  Laterality: Right;  . LEFT HEART CATHETERIZATION WITH CORONARY ANGIOGRAM N/A 03/28/2013   Procedure: LEFT HEART CATHETERIZATION WITH CORONARY ANGIOGRAM;  Surgeon: Leonie Man, MD;  Location: [facility] C[facility] LAB;  Service: Cardiovascular;  Laterality: N/A;  . NM MYOVIEW LTD    . PENILE PROSTHESIS IMPLANT N/A 08/16/2015   Procedure: PENILE PROTHESIS INFLATABLE, three piece, Excisional biopsy of Penile ulcer, Penile molding;  Surgeon: Carolan Clines, MD;  Location: [facility];  Service: Urology;  Laterality: N/A;  . PENILE PROSTHESIS IMPLANT N/A 12/24/2017   Procedure: REMOVAL AND  REPLACEMENT  COLOPLAST PENILE PROSTHESIS;  Surgeon: Lucas Mallow, MD;  Location: [facility];  Service: Urology;  Laterality: N/A;  . QUADRICEPS TENDON REPAIR  07/21/2011   Procedure: REPAIR QUADRICEP TENDON;  Surgeon: Arther Abbott, MD;  Location: [facility];  Service: Orthopedics;  Laterality: Right;  . TOENAIL EXCISION     removed [id number]  . UMBILICAL HERNIA REPAIR  2007   [place]    Prior to Admission medications   Medication Sig Start Date End Date Taking? Authorizing Provider  acetaminophen (TYLENOL) 650 MG CR tablet Take 1,300 mg by mouth daily as needed for pain.   Yes [provider]  allopurinol (ZYLOPRIM) 100 MG tablet Take 100 mg by mouth daily.   Yes [provider]  aspirin EC 81 MG tablet Take 1 tablet (81 mg total) by mouth daily. 05/01/16  Yes Hilty, Nadean Corwin, MD  budesonide-formoterol (SYMBICORT) 160-4.5 MCG/ACT inhaler  Inhale 2 puffs into the lungs 2 (two) times daily as needed (Shortness of Breath).    Yes [provider]  calcitRIOL (ROCALTROL) 0.25 MCG capsule Take 0.25 mcg by mouth daily.   Yes [provider]  cetirizine (ZYRTEC) 10 MG tablet Take 10 mg by mouth at bedtime as needed for allergies.    Yes [provider]  diclofenac sodium (VOLTAREN) 1 % GEL Apply 1 application topically 2 (two) times daily as needed (muscle pain).   Yes [provider]  epoetin alfa (EPOGEN,PROCRIT) 4000 UNIT/ML injection Inject 8,000 Units into the skin as needed (Given if hgb < 10).    Yes [provider]  folic acid (FOLVITE) 1 MG tablet Take 1 tablet by mouth daily. 07/29/18  Yes [provider]  Insulin Glargine (LANTUS SOLOSTAR) 100 UNIT/ML Solostar Pen INJECT 30 UNITS SUBCUTANEOUSLY AT BEDTIME. 04/01/18  Yes Nida, Marella Chimes, MD  Insulin Pen Needle (B-D UF III MINI PEN NEEDLES) 31G X 5 MM MISC INJECT SUBCUTANEOUSLY TWICE DAILY AS DIRECTED 03/21/18  Yes Nida, Marella Chimes, MD  lidocaine-prilocaine (EMLA) cream Apply 1 application topically daily as needed. 07/19/18  Yes [provider]  mometasone (NASONEX) 50 MCG/ACT nasal spray Place 2 sprays into the nose 2 (two) times daily as needed (allergies).    Yes [provider]  pantoprazole (PROTONIX) 40 MG tablet TAKE 1 TABLET TWICE DAILY 30 MINUTES PRIOR TO MEALS Patient taking differently: daily. TAKE 1 TABLET TWICE DAILY 30 MINUTES PRIOR TO MEALS 02/28/18  Yes Carlis Stable, NP  simvastatin (ZOCOR) 20 MG tablet Take 20 mg by mouth every morning.    Yes [provider]  TRADJENTA 5 MG TABS tablet TAKE 1 TABLET EVERY DAY 05/17/18  Yes Nida, Marella Chimes, MD    Current Facility-Administered Medications  Medication Dose Route Frequency Provider Last Rate Last Dose  . 0.9 %  sodium chloride infusion  250 mL Intravenous PRN Manuella Ghazi, Pratik D, DO      . acetaminophen (TYLENOL) tablet 650 mg  650  mg Oral Q6H PRN Manuella Ghazi, Pratik D, DO       Or  . acetaminophen (TYLENOL) suppository 650 mg  650 mg Rectal Q6H PRN Manuella Ghazi, Pratik D, DO      . allopurinol (ZYLOPRIM) tablet 100 mg  100 mg Oral Daily Shah, Pratik D, DO      . aspirin EC tablet 81 mg  81 mg Oral Daily Shah, Pratik D, DO      . calcitRIOL (ROCALTROL) capsule 0.25 mcg  0.25 mcg Oral Daily Manuella Ghazi, Pratik D, DO      . fluticasone (FLONASE) 50 MCG/ACT nasal spray 2 spray  2 spray Each Nare Daily Manuella Ghazi, Pratik D, DO      . folic acid (FOLVITE) tablet 1 mg  1 mg Oral Daily Manuella Ghazi, Pratik D, DO      . heparin injection 5,000 Units  5,000 Units Subcutaneous Q8H Shah, Pratik D, DO   5,000 Units at 08/30/18 2725  . insulin aspart (novoLOG) injection 0-5 Units  0-5 Units Subcutaneous QHS Manuella Ghazi, Pratik D, DO      . insulin aspart (novoLOG) injection 0-9 Units  0-9 Units Subcutaneous TID WC Shah, Pratik D, DO      . insulin glargine (LANTUS) injection 15 Units  15 Units Subcutaneous Q2200 Heath Lark D, DO   15 Units at 08/29/18 2308  . lidocaine-prilocaine (EMLA) cream 1 application  1 application Topical Daily PRN Manuella Ghazi, Pratik D, DO      . loratadine (CLARITIN) tablet 10 mg  10 mg Oral Daily Manuella Ghazi, Pratik D, DO      . mometasone-formoterol (DULERA) 200-5 MCG/ACT inhaler 2 puff  2 puff Inhalation BID Manuella Ghazi, Pratik D, DO   2 puff at 08/30/18 0727  . ondansetron (ZOFRAN) tablet 4 mg  4 mg Oral Q6H PRN Manuella Ghazi, Pratik D, DO       Or  . ondansetron (ZOFRAN) injection 4 mg  4 mg Intravenous Q6H PRN Manuella Ghazi, Pratik D, DO      . pantoprazole (PROTONIX) EC tablet 40 mg  40 mg Oral BID Manuella Ghazi, Pratik D, DO   40 mg at 08/29/18 2310  . simvastatin (ZOCOR) tablet 20 mg  20 mg Oral q morning - 10a Shah, Pratik D, DO      . sodium chloride flush (NS) 0.9 % injection 3 mL  3 mL Intravenous Q12H Manuella Ghazi, Pratik D,  DO   3 mL at 08/29/18 2309  . sodium chloride flush (NS) 0.9 % injection 3 mL  3 mL Intravenous PRN Manuella Ghazi, Pratik D, DO        Allergies as of 08/29/2018 - Review  Complete 08/29/2018  Allergen Reaction Noted  . Tramadol hcl  02/27/2018  . Opana [oxymorphone hcl] Itching 01/15/2014  . Oxymorphone Itching 01/15/2014  . Tramadol Itching 07/10/2011    Family History  Problem Relation Age of Onset  . Hypertension Mother        MI  . Cancer Mother        breast   . Diabetes Mother   . Diabetes Father   . Hypertension Father   . Hypertension Sister   . Diabetes Sister   . Arthritis Other   . Asthma Other   . Lung disease Other   . Anesthesia problems Neg Hx   . Hypotension Neg Hx   . Malignant hyperthermia Neg Hx   . Pseudochol deficiency Neg Hx   . Colon cancer Neg Hx     Social History   Socioeconomic History  . Marital status: Married    Spouse name: Not on file  . Number of children: 2  . Years of education: 12th grade  . Highest education level: Not on file  Occupational History  . Occupation: disabled  . Occupation:      Employer: UNEMPLOYED  Social Needs  . Financial resource strain: Not on file  . Food insecurity:    Worry: Not on file    Inability: Not on file  . Transportation needs:    Medical: Not on file    Non-medical: Not on file  Tobacco Use  . Smoking status: Former Smoker    Packs/day: 1.00    Years: 25.00    Pack years: 25.00    Types: Cigarettes    Last attempt to quit: 03/27/2010    Years since quitting: 8.4  . Smokeless tobacco: Never Used  . Tobacco comment: Quit x 7 years  Substance and Sexual Activity  . Alcohol use: Yes    Alcohol/week: 0.0 standard drinks    Comment: occasionally  . Drug use: Yes    Types: Marijuana    Comment: cocaine- last time used- 11/24/2017 , marijuana-   . Sexual activity: Not on file  Lifestyle  . Physical activity:    Days per week: Not on file    Minutes per session: Not on file  . Stress: Not on file  Relationships  . Social connections:    Talks on phone: Not on file    Gets together: Not on file    Attends religious service: Not on file    Active member  of club or organization: Not on file    Attends meetings of clubs or organizations: Not on file    Relationship status: Not on file  . Intimate partner violence:    Fear of current or ex partner: Not on file    Emotionally abused: Not on file    Physically abused: Not on file    Forced sexual activity: Not on file  Other Topics Concern  . Not on file  Social History Narrative   He quit smoking in 2010. 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 He developed pulmonary problems, became disabled because of lower airway disease in 2009.       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    Review of  Systems: Gen: Denies any fever, chills, sweats, anorexia, fatigue, weakness, malaise, weight loss, and sleep disorder HEENT: No visual complaints, No history of Retinopathy. Normal external appearance No Epistaxis or Sore throat. No sinusitis.   CV: No current chest pain no dyspnea to conversation  Resp: Denies dyspnea at rest, positive dyspnea on exertion, cough, sputum, wheezing, coughing up blood, and pleurisy. GI: Denies vomiting blood, jaundice, and fecal incontinence.   Denies dysphagia or odynophagia. GU : End-stage renal disease very little urine output MS: Denies joint pain, limitation of movement, and swelling, stiffness, low back pain, extremity pain. Denies muscle weakness, cramps, atrophy.  No use of non steroidal antiinflammatory drugs. Derm: Denies rash, itching, dry skin, hives, moles, warts, or unhealing ulcers.  Psych: Denies depression, anxiety, memory loss, suicidal ideation, hallucinations, paranoia, and confusion. Heme: Denies bruising, bleeding, and enlarged lymph nodes. Neuro: No headache.  No diplopia. No dysarthria.  No dysphasia.  No history of CVA.  No Seizures. No paresthesias.  No weakness. Endocrine positive for diabetes.  No Thyroid disease.  No Adrenal disease.  Physical Exam: Vital signs in last 24 hours: Temp:  [97.5 F (36.4  C)-98.5 F (36.9 C)] 98.3 F (36.8 C) (02/14 0535) Pulse Rate:  [64-69] 68 (02/14 0535) Resp:  [15-23] 18 (02/14 0535) BP: (129-159)/(83-102) 157/91 (02/14 0535) SpO2:  [95 %-100 %] 96 % (02/14 0727) Weight:  [105.7 kg] 105.7 kg (02/13 1036) Last BM Date: 08/29/18 General:   Obese nondistressed mild periorbital edema Head:  Normocephalic and atraumatic. Eyes:  Sclera clear, no icterus.   Conjunctiva pink. Ears:  Normal auditory acuity. Nose:  No deformity, discharge,  or lesions. Mouth:  No deformity or lesions, dentition normal. Neck:  Supple; no masses or thyromegaly. JVP not elevated Lungs:  Clear throughout to auscultation.   No wheezes, crackles, or rhonchi. No acute distress.  Right IJ catheter site clean and dry Heart:  Regular rate and rhythm;, clicks, rubs,  or gallops.  No murmurs Abdomen:  Soft, nontender and nondistended. No masses, hepatosplenomegaly or hernias noted. Normal bowel sounds, without guarding, and without rebound.  Obese Msk:  Symmetrical without gross deformities. Normal posture. Pulses:  No carotid, renal, femoral bruits. DP and PT symmetrical and equal Extremities:  Without clubbing or edema.  AV fistula left thrill and bruit neurologic:  Alert and  oriented x4;  grossly normal neurologically. Skin:  Intact without significant lesions or rashes. Cervical Nodes:  No significant cervical adenopathy. Psych:  Alert and cooperative. Normal mood and affect.  Intake/Output from previous day: No intake/output data recorded. Intake/Output this shift: No intake/output data recorded.  Lab Results: Recent Labs    08/29/18 1105 08/30/18 0441  WBC 3.9* 3.6*  HGB 8.5* 8.7*  HCT 29.4* 30.2*  PLT 160 168   BMET Recent Labs    08/29/18 1105 08/30/18 0441  NA 141 141  K 4.1 4.1  CL 105 106  CO2 28 23  GLUCOSE 80 123*  BUN 31* 42*  CREATININE 7.10* 7.79*  CALCIUM 8.7* 8.9   LFT No results for input(s): PROT, ALBUMIN, AST, ALT, ALKPHOS, BILITOT,  BILIDIR, IBILI in the last 72 hours. PT/INR No results for input(s): LABPROT, INR in the last 72 hours. Hepatitis Panel No results for input(s): HEPBSAG, HCVAB, HEPAIGM, HEPBIGM in the last 72 hours.  Studies/Results: Dg Chest 2 View  Result Date: 08/29/2018 CLINICAL DATA:  Chest pain and sob since Tuesday. Hx of copd, htn and diabetes. EXAM: CHEST - 2 VIEW COMPARISON:  09/28/2016 FINDINGS:  Cardiac silhouette is top-normal in size. No mediastinal or hilar masses. There is no evidence of adenopathy. Clear lungs.  No pleural effusion or pneumothorax. Right internal jugular tunneled dual lumen central venous catheter is new since the prior exam, tip projecting in the lower superior vena cava. Skeletal structures are intact. IMPRESSION: No active cardiopulmonary disease. Electronically Signed   By: Lajean Manes M.D.   On: 08/29/2018 11:34    Assessment/Plan:  End-stage renal disease Tuesday Thursday Saturday [facility] unit obtain dialysis records and will proceed with dialysis tomorrow there is no urgent need for dialysis today.  Slightly increased volume status but no evidence of hyperkalemia  Hypertension/volume chest x-ray appears clear lungs clinically sound clear  Anemia patient receiving Epogen will check on dose we will check iron studies hemoglobin seems rather low.  Anginal chest pain with history of cocaine use plan for stress test today  Hyperlipidemia on statin  Gout allopurinol 100 mg daily  Diabetes mellitus as per primary team  Secondary hyperparathyroidism calcitriol 0.25 mcg daily will obtain outpatient records   LOS: 0 Sherril Croon @TODAY @10 :03 AM

## 2018-08-30 NOTE — Care Management Obs Status (Signed)
[facility] NOTIFICATION   Patient Details  Name: [name] [name] MRN: [id number] Date of Birth: [birth year]   Medicare Observation Status Notification Given:  Yes    Tommy Medal 08/30/2018, 2:57 PM

## 2018-08-30 NOTE — Progress Notes (Signed)
Progress Note  Patient Name: [name] [name] Date of Encounter: 08/30/2018  Primary Cardiologist: Dr Debara Pickett  Subjective   No complaints  Inpatient Medications    Scheduled Meds: . allopurinol  100 mg Oral Daily  . aspirin EC  81 mg Oral Daily  . calcitRIOL  0.25 mcg Oral Daily  . fluticasone  2 spray Each Nare Daily  . folic acid  1 mg Oral Daily  . heparin  5,000 Units Subcutaneous Q8H  . insulin aspart  0-5 Units Subcutaneous QHS  . insulin aspart  0-9 Units Subcutaneous TID WC  . insulin glargine  15 Units Subcutaneous Q2200  . loratadine  10 mg Oral Daily  . mometasone-formoterol  2 puff Inhalation BID  . pantoprazole  40 mg Oral BID  . simvastatin  20 mg Oral q morning - 10a  . sodium chloride flush  3 mL Intravenous Q12H   Continuous Infusions: . sodium chloride     PRN Meds: sodium chloride, acetaminophen **OR** acetaminophen, lidocaine-prilocaine, ondansetron **OR** ondansetron (ZOFRAN) IV, sodium chloride flush   Vital Signs    Vitals:   08/29/18 2025 08/29/18 2128 08/30/18 0535 08/30/18 0727  BP:  (!) 159/102 (!) 157/91   Pulse:  69 68   Resp:  20 18   Temp:  97.8 F (36.6 C) 98.3 F (36.8 C)   TempSrc:  Oral Oral   SpO2: 95% 97% 100% 96%  Weight:      Height:        Intake/Output Summary (Last 24 hours) at 08/30/2018 0907 Last data filed at 08/29/2018 1600 Gross per 24 hour  Intake 0 ml  Output -  Net 0 ml   Last 3 Weights 08/29/2018 08/16/2018 08/06/2018  Weight (lbs) 233 lb 242 lb 242 lb  Weight (kg) 105.688 kg 109.77 kg 109.77 kg      Telemetry    SR- Personally Reviewed  ECG  na  Physical Exam   GEN: No acute distress.   Neck: No JVD Cardiac: RRR, Respiratory: Clear to auscultation bilaterally. GI: Soft, nontender, non-distended  MS: No edema; No deformity. Neuro:  Nonfocal  Psych: Normal affect   Labs    Chemistry Recent Labs  Lab 08/29/18 1105 08/30/18 0441  NA 141 141  K 4.1 4.1  CL 105 106  CO2 28 23  GLUCOSE  80 123*  BUN 31* 42*  CREATININE 7.10* 7.79*  CALCIUM 8.7* 8.9  GFRNONAA 7* 6*  GFRAA 8* 7*  ANIONGAP 8 12     Hematology Recent Labs  Lab 08/29/18 1105 08/30/18 0441  WBC 3.9* 3.6*  RBC 3.36* 3.44*  HGB 8.5* 8.7*  HCT 29.4* 30.2*  MCV 87.5 87.8  MCH 25.3* 25.3*  MCHC 28.9* 28.8*  RDW 17.9* 17.7*  PLT 160 168    Cardiac Enzymes Recent Labs  Lab 08/29/18 1105 08/29/18 1702 08/29/18 2318 08/30/18 0441  TROPONINI 0.05* 0.05* 0.04* 0.04*   No results for input(s): TROPIPOC in the last 168 hours.   BNPNo results for input(s): BNP, PROBNP in the last 168 hours.   DDimer No results for input(s): DDIMER in the last 168 hours.   Radiology    Dg Chest 2 View  Result Date: 08/29/2018 CLINICAL DATA:  Chest pain and sob since Tuesday. Hx of copd, htn and diabetes. EXAM: CHEST - 2 VIEW COMPARISON:  09/28/2016 FINDINGS: Cardiac silhouette is top-normal in size. No mediastinal or hilar masses. There is no evidence of adenopathy. Clear lungs.  No pleural effusion or  pneumothorax. Right internal jugular tunneled dual lumen central venous catheter is new since the prior exam, tip projecting in the lower superior vena cava. Skeletal structures are intact. IMPRESSION: No active cardiopulmonary disease. Electronically Signed   By: Lajean Manes M.D.   On: 08/29/2018 11:34    Cardiac Studies    Patient Profile     [name] [name] is a 67 y.o. male with a hx of nonobstructive CAD and ESRD who is being seen today for the evaluation of chest pain at the request of Dr Orson Eva.  Assessment & Plan    1. Chest pain - nonobstructive CAD by cath in 2014 - current symptoms fairly atypical. Very slight trop in setting of ESRD is not specific. EKG without acute ischemic changes  - trop peak 0.05 and flat not consistent with ACS.  - EKG inferior TWIs new, lateral precordial old but more prominent. Probabyl due to LVH but cannot exclude ischemia - echo showed LVEF 50-55%, severe LVH  - we  will plan for a lexiscan today to further evaluate for ischemia. - + cocaine, could be vasospasm however cocaine can also accelerate CAD progression and thus we will evaluate with stress test.     For questions or updates, please contact [facility] Please consult [email or web address] for contact info under        Signed, Carlyle Dolly, MD  08/30/2018, 9:07 AM

## 2018-08-30 NOTE — Discharge Summary (Signed)
Physician Discharge Summary  [name] [name] WJX:[id number] DOB: 21-Jun-1951 DOA: 08/29/2018  PCP: [name], MD  Admit date: 08/29/2018  Discharge date: 08/30/2018  Admitted From:Home  Disposition:  Home  Recommendations for Outpatient Follow-up:  1. Follow up with PCP in 1-2 weeks 2. Continue routine hemodialysis for scheduled starting tomorrow  Home Health: None  Equipment/Devices: None  Discharge Condition: Stable  CODE STATUS: Full  Diet recommendation: Heart Healthy/carb modified  Brief/Interim Summary: Per HPI: [name] [name] is a 68 y.o. male with medical history significant for nonobstructive CAD by cath in 2014, ESRD on HD, type 2 diabetes, hypertension, chronic pain, COPD, and obesity who presents to the ED with intermittent left-sided chest pain that was noted to be sharp in nature that began on Tuesday of this week.  The pain would last for approximately a few seconds and then disappear on its own.  He has had no other diaphoresis, shortness of breath, lower extremity edema, orthopnea, or associated symptoms.  He was noticed to have an episode approximately 1 hour into hemodialysis today for which she was sent to the ED.  Patient was admitted for atypical chest pain in the setting of nonobstructive CAD with noted cocaine abuse.  This seems to be the cause of his chest pain.  Fortunately, there does not appear to be any signs of ACS and patient has had no further chest pain or acute events while here.  He has been seen by cardiology and has undergone nuclear stress test today with low risk findings.  He is to continue his home medications as otherwise prescribed and has been counseled on cessation of cocaine use.  He is also been seen by nephrology, but does not appear to have any acute hemodialysis needs.  He will continue with his routine hemodialysis starting back tomorrow.  Discharge Diagnoses:  Principal Problem:   Chest pain Active Problems:   Diabetes mellitus  with stage 5 chronic kidney disease (HCC)   Essential hypertension   CAD (coronary artery disease)   Coronary artery disease involving native coronary artery of native heart without angina pectoris  Principal discharge diagnosis: Chest pain secondary to cocaine use.  Discharge Instructions  Discharge Instructions    Diet - low sodium heart healthy   Complete by:  As directed    Increase activity slowly   Complete by:  As directed      Allergies as of 08/30/2018      Reactions   Tramadol Hcl    Opana [oxymorphone Hcl] Itching   Oxymorphone Itching   Tramadol Itching      Medication List    TAKE these medications   acetaminophen 650 MG CR tablet Commonly known as:  TYLENOL Take 1,300 mg by mouth daily as needed for pain.   allopurinol 100 MG tablet Commonly known as:  ZYLOPRIM Take 100 mg by mouth daily.   aspirin EC 81 MG tablet Take 1 tablet (81 mg total) by mouth daily.   budesonide-formoterol 160-4.5 MCG/ACT inhaler Commonly known as:  SYMBICORT Inhale 2 puffs into the lungs 2 (two) times daily as needed (Shortness of Breath).   calcitRIOL 0.25 MCG capsule Commonly known as:  ROCALTROL Take 0.25 mcg by mouth daily.   cetirizine 10 MG tablet Commonly known as:  ZYRTEC Take 10 mg by mouth at bedtime as needed for allergies.   diclofenac sodium 1 % Gel Commonly known as:  VOLTAREN Apply 1 application topically 2 (two) times daily as needed (muscle pain).   epoetin  alfa 4000 UNIT/ML injection Commonly known as:  EPOGEN,PROCRIT Inject 8,000 Units into the skin as needed (Given if hgb < 10).   folic acid 1 MG tablet Commonly known as:  FOLVITE Take 1 tablet by mouth daily.   Insulin Glargine 100 UNIT/ML Solostar Pen Commonly known as:  LANTUS SOLOSTAR INJECT 30 UNITS SUBCUTANEOUSLY AT BEDTIME.   Insulin Pen Needle 31G X 5 MM Misc Commonly known as:  B-D UF III MINI PEN NEEDLES INJECT SUBCUTANEOUSLY TWICE DAILY AS DIRECTED   lidocaine-prilocaine  cream Commonly known as:  EMLA Apply 1 application topically daily as needed.   NASONEX 50 MCG/ACT nasal spray Generic drug:  mometasone Place 2 sprays into the nose 2 (two) times daily as needed (allergies).   pantoprazole 40 MG tablet Commonly known as:  PROTONIX TAKE 1 TABLET TWICE DAILY 30 MINUTES PRIOR TO MEALS What changed:  when to take this   simvastatin 20 MG tablet Commonly known as:  ZOCOR Take 20 mg by mouth every morning.   TRADJENTA 5 MG Tabs tablet Generic drug:  linagliptin TAKE 1 TABLET EVERY DAY      Follow-up Information    [name], MD Follow up in 1 week(s).   Specialty:  Internal Medicine Contact information: [place] [address] [phone]          Allergies  Allergen Reactions  . Tramadol Hcl   . Opana [Oxymorphone Hcl] Itching  . Oxymorphone Itching  . Tramadol Itching    Consultations:  Cardiology  Nephrology   Procedures/Studies: Dg Chest 2 View  Result Date: 08/29/2018 CLINICAL DATA:  Chest pain and sob since Tuesday. Hx of copd, htn and diabetes. EXAM: CHEST - 2 VIEW COMPARISON:  09/28/2016 FINDINGS: Cardiac silhouette is top-normal in size. No mediastinal or hilar masses. There is no evidence of adenopathy. Clear lungs.  No pleural effusion or pneumothorax. Right internal jugular tunneled dual lumen central venous catheter is new since the prior exam, tip projecting in the lower superior vena cava. Skeletal structures are intact. IMPRESSION: No active cardiopulmonary disease. Electronically Signed   By: Lajean Manes M.D.   On: 08/29/2018 11:34   Nm Myocar Multi W/spect W/wall Motion / Ef  Result Date: 08/30/2018  There was no ST segment deviation noted during stress.  This is a low risk study.  The study is normal. There are no perfusion defects consistent with prior infarct or current ischemia.  The left ventricular ejection fraction is normal (55-65%).    Ir Fluoro Guide Cv Line  Right  Result Date: 08/06/2018 CLINICAL DATA:  Renal failure, needs durable venous access for hemodialysis. Nonfunctioning AV fistula. EXAM: TUNNELED HEMODIALYSIS CATHETER PLACEMENT WITH ULTRASOUND AND FLUOROSCOPIC GUIDANCE TECHNIQUE: The procedure, risks, benefits, and alternatives were explained to the patient. Questions regarding the procedure were encouraged and answered. The patient understands and consents to the procedure. As antibiotic prophylaxis, cefazolin 2 g was ordered pre-procedure and administered intravenously within one hour of incision.Patency of the right IJ vein was confirmed with ultrasound with image documentation. An appropriate skin site was determined. Region was prepped using maximum barrier technique including cap and mask, sterile gown, sterile gloves, large sterile sheet, and Chlorhexidine as cutaneous antisepsis. The region was infiltrated locally with 1% lidocaine. Intravenous Fentanyl and Versed were administered as conscious sedation during continuous monitoring of the patient's level of consciousness and physiological / cardiorespiratory status by the radiology RN, with a total moderate sedation time of 17 minutes. Under real-time ultrasound guidance, the right  IJ vein was accessed with a 21 gauge micropuncture needle; the needle tip within the vein was confirmed with ultrasound image documentation. Needle exchanged over the 018 guidewire for transitional dilator, which allowed advancement of a Benson wire into the IVC. Over this, an MPA catheter was advanced. A Palindrome 19 hemodialysis catheter was tunneled from the right anterior chest wall approach to the right IJ dermatotomy site. The MPA catheter was exchanged over an Amplatz wire for serial vascular dilators which allow placement of a peel-away sheath, through which the catheter was advanced under intermittent fluoroscopy, positioned with its tips in the proximal and midright atrium. Spot chest radiograph confirms good  catheter position. No pneumothorax. Catheter was flushed and primed per protocol. Catheter secured externally with O Prolene sutures. The right IJ dermatotomy site was closed with Dermabond. COMPLICATIONS: COMPLICATIONS None immediate FLUOROSCOPY TIME:  30 seconds; 8 mGy COMPARISON:  None IMPRESSION: 1. Technically successful placement of tunneled right IJ hemodialysis catheter with ultrasound and fluoroscopic guidance. Ready for routine use. Electronically Signed   By: Lucrezia Europe M.D.   On: 08/06/2018 13:38   Ir US Guide Vasc Access Right  Result Date: 08/06/2018 CLINICAL DATA:  Renal failure, needs durable venous access for hemodialysis. Nonfunctioning AV fistula. EXAM: TUNNELED HEMODIALYSIS CATHETER PLACEMENT WITH ULTRASOUND AND FLUOROSCOPIC GUIDANCE TECHNIQUE: The procedure, risks, benefits, and alternatives were explained to the patient. Questions regarding the procedure were encouraged and answered. The patient understands and consents to the procedure. As antibiotic prophylaxis, cefazolin 2 g was ordered pre-procedure and administered intravenously within one hour of incision.Patency of the right IJ vein was confirmed with ultrasound with image documentation. An appropriate skin site was determined. Region was prepped using maximum barrier technique including cap and mask, sterile gown, sterile gloves, large sterile sheet, and Chlorhexidine as cutaneous antisepsis. The region was infiltrated locally with 1% lidocaine. Intravenous Fentanyl and Versed were administered as conscious sedation during continuous monitoring of the patient's level of consciousness and physiological / cardiorespiratory status by the radiology RN, with a total moderate sedation time of 17 minutes. Under real-time ultrasound guidance, the right IJ vein was accessed with a 21 gauge micropuncture needle; the needle tip within the vein was confirmed with ultrasound image documentation. Needle exchanged over the 018 guidewire for  transitional dilator, which allowed advancement of a Benson wire into the IVC. Over this, an MPA catheter was advanced. A Palindrome 19 hemodialysis catheter was tunneled from the right anterior chest wall approach to the right IJ dermatotomy site. The MPA catheter was exchanged over an Amplatz wire for serial vascular dilators which allow placement of a peel-away sheath, through which the catheter was advanced under intermittent fluoroscopy, positioned with its tips in the proximal and midright atrium. Spot chest radiograph confirms good catheter position. No pneumothorax. Catheter was flushed and primed per protocol. Catheter secured externally with O Prolene sutures. The right IJ dermatotomy site was closed with Dermabond. COMPLICATIONS: COMPLICATIONS None immediate FLUOROSCOPY TIME:  30 seconds; 8 mGy COMPARISON:  None IMPRESSION: 1. Technically successful placement of tunneled right IJ hemodialysis catheter with ultrasound and fluoroscopic guidance. Ready for routine use. Electronically Signed   By: Lucrezia Europe M.D.   On: 08/06/2018 13:38   Vas US Duplex Dialysis Access (avf, Avg)  Result Date: 08/16/2018 DIALYSIS ACCESS Reason for Exam: Unable to dialyze through AVF/AVG. Access Site: Left Upper Extremity. Access Type: Brachial-cephalic AVF. History: 02/27/4817: Brachiocephalic AVF creation. Performing Technologist: Burley Saver RVT  Examination Guidelines: A complete evaluation includes B-mode imaging, spectral  Doppler, color Doppler, and power Doppler as needed of all accessible portions of each vessel. Unilateral testing is considered an integral part of a complete examination. Limited examinations for reoccurring indications may be performed as noted.  Findings: +--------------------+----------+-----------------+--------+ AVF                 PSV (cm/s)Flow Vol (mL/min)Comments +--------------------+----------+-----------------+--------+ Native artery inflow   191          1146                 +--------------------+----------+-----------------+--------+ AVF Anastomosis        182                              +--------------------+----------+-----------------+--------+  +------------+----------+-------------+----------+-----------------------------+ OUTFLOW VEINPSV (cm/s)Diameter (cm)Depth (cm)          Describe            +------------+----------+-------------+----------+-----------------------------+ Shoulder        96        0.63        0.57         competing branch        +------------+----------+-------------+----------+-----------------------------+ Prox UA        132        0.73        0.75         competing branch        +------------+----------+-------------+----------+-----------------------------+ Mid UA         439        0.49        0.54   change in Diameter, competing                                              branch, stenotic and tortuous +------------+----------+-------------+----------+-----------------------------+ Dist UA         86        0.77        0.34                                 +------------+----------+-------------+----------+-----------------------------+ AC Fossa       144        1.16        0.35                                 +------------+----------+-------------+----------+-----------------------------+ Branches noted in the mid upper arm measure 0.32 cm, 0.39 cm, 0.23 cm, and 0.36 cm.   Summary: Patent left brachiocephalic fistula. Significant tortuosity noted in the mid upper arm, with elevated velocities, and change in vessel diameter.  *See table(s) above for measurements and observations.  Diagnosing physician: Servando Snare MD Electronically signed by Servando Snare MD on 08/16/2018 at 11:27:09 AM.    --------------------------------------------------------------------------------   Final      Discharge Exam: Vitals:   08/30/18 0727 08/30/18 1358  BP:  (!) 142/72  Pulse:  75  Resp:  16  Temp:    SpO2: 96% 98%    Vitals:   08/29/18 2128 08/30/18 0535 08/30/18 0727 08/30/18 1358  BP: (!) 159/102 (!) 157/91  (!) 142/72  Pulse: 69 68  75  Resp: 20 18  16   Temp: 97.8 F (36.6 C) 98.3  F (36.8 C)    TempSrc: Oral Oral    SpO2: 97% 100% 96% 98%  Weight:      Height:        General: Pt is alert, awake, not in acute distress Cardiovascular: RRR, S1/S2 +, no rubs, no gallops Respiratory: CTA bilaterally, no wheezing, no rhonchi Abdominal: Soft, NT, ND, bowel sounds + Extremities: no edema, no cyanosis    The results of significant diagnostics from this hospitalization (including imaging, microbiology, ancillary and laboratory) are listed below for reference.     Microbiology: Recent Results (from the past 240 hour(s))  MRSA PCR Screening     Status: None   Collection Time: 08/29/18  3:17 PM  Result Value Ref Range Status   MRSA by PCR NEGATIVE NEGATIVE Final    Comment:        The GeneXpert MRSA Assay (FDA approved for NASAL specimens only), is one component of a comprehensive MRSA colonization surveillance program. It is not intended to diagnose MRSA infection nor to guide or monitor treatment for MRSA infections. Performed at [facility], [address] [place], [place] [address]      Labs: BNP (last 3 results) No results for input(s): BNP in the last 8760 hours. Basic Metabolic Panel: Recent Labs  Lab 08/29/18 1105 08/30/18 0441  NA 141 141  K 4.1 4.1  CL 105 106  CO2 28 23  GLUCOSE 80 123*  BUN 31* 42*  CREATININE 7.10* 7.79*  CALCIUM 8.7* 8.9   Liver Function Tests: No results for input(s): AST, ALT, ALKPHOS, BILITOT, PROT, ALBUMIN in the last 168 hours. No results for input(s): LIPASE, AMYLASE in the last 168 hours. No results for input(s): AMMONIA in the last 168 hours. CBC: Recent Labs  Lab 08/29/18 1105 08/30/18 0441  WBC 3.9* 3.6*  HGB 8.5* 8.7*  HCT 29.4* 30.2*  MCV 87.5 87.8  PLT 160 168   Cardiac Enzymes: Recent Labs  Lab 08/29/18 1105  08/29/18 1702 08/29/18 2318 08/30/18 0441  TROPONINI 0.05* 0.05* 0.04* 0.04*   BNP: Invalid input(s): POCBNP CBG: Recent Labs  Lab 08/29/18 1652 08/29/18 2124 08/30/18 0750  GLUCAP 107* 100* 115*   D-Dimer No results for input(s): DDIMER in the last 72 hours. Hgb A1c No results for input(s): HGBA1C in the last 72 hours. Lipid Profile No results for input(s): CHOL, HDL, LDLCALC, TRIG, CHOLHDL, LDLDIRECT in the last 72 hours. Thyroid function studies No results for input(s): TSH, T4TOTAL, T3FREE, THYROIDAB in the last 72 hours.  Invalid input(s): FREET3 Anemia work up No results for input(s): VITAMINB12, FOLATE, FERRITIN, TIBC, IRON, RETICCTPCT in the last 72 hours. Urinalysis    Component Value Date/Time   COLORURINE YELLOW 01/15/2014 2320   APPEARANCEUR CLEAR 01/15/2014 2320   LABSPEC 1.010 01/15/2014 2320   PHURINE 5.5 01/15/2014 2320   GLUCOSEU NEGATIVE 01/15/2014 2320   HGBUR TRACE (A) 01/15/2014 2320   BILIRUBINUR NEGATIVE 01/15/2014 2320   KETONESUR NEGATIVE 01/15/2014 2320   PROTEINUR 30 (A) 01/15/2014 2320   UROBILINOGEN 0.2 01/15/2014 2320   NITRITE NEGATIVE 01/15/2014 2320   LEUKOCYTESUR NEGATIVE 01/15/2014 2320   Sepsis Labs Invalid input(s): PROCALCITONIN,  WBC,  LACTICIDVEN Microbiology Recent Results (from the past 240 hour(s))  MRSA PCR Screening     Status: None   Collection Time: 08/29/18  3:17 PM  Result Value Ref Range Status   MRSA by PCR NEGATIVE NEGATIVE Final    Comment:        The GeneXpert MRSA Assay (FDA approved  for NASAL specimens only), is one component of a comprehensive MRSA colonization surveillance program. It is not intended to diagnose MRSA infection nor to guide or monitor treatment for MRSA infections. Performed at [facility], [address] [place], [place] [address]      Time coordinating discharge: 35 minutes  SIGNED:   Rodena Goldmann, DO [facility] 08/30/2018, 2:43 PM Pager [phone]  If  7PM-7AM, please contact night-coverage [email or web address] Password [email or web address]

## 2018-08-31 DIAGNOSIS — Z992 Dependence on renal dialysis: Secondary | ICD-10-CM | POA: Diagnosis not present

## 2018-08-31 DIAGNOSIS — D509 Iron deficiency anemia, unspecified: Secondary | ICD-10-CM | POA: Diagnosis not present

## 2018-08-31 DIAGNOSIS — N186 End stage renal disease: Secondary | ICD-10-CM | POA: Diagnosis not present

## 2018-08-31 DIAGNOSIS — D631 Anemia in chronic kidney disease: Secondary | ICD-10-CM | POA: Diagnosis not present

## 2018-08-31 DIAGNOSIS — Z23 Encounter for immunization: Secondary | ICD-10-CM | POA: Diagnosis not present

## 2018-09-03 DIAGNOSIS — Z23 Encounter for immunization: Secondary | ICD-10-CM | POA: Diagnosis not present

## 2018-09-03 DIAGNOSIS — Z992 Dependence on renal dialysis: Secondary | ICD-10-CM | POA: Diagnosis not present

## 2018-09-03 DIAGNOSIS — N186 End stage renal disease: Secondary | ICD-10-CM | POA: Diagnosis not present

## 2018-09-03 DIAGNOSIS — D631 Anemia in chronic kidney disease: Secondary | ICD-10-CM | POA: Diagnosis not present

## 2018-09-03 DIAGNOSIS — D509 Iron deficiency anemia, unspecified: Secondary | ICD-10-CM | POA: Diagnosis not present

## 2018-09-05 DIAGNOSIS — Z23 Encounter for immunization: Secondary | ICD-10-CM | POA: Diagnosis not present

## 2018-09-05 DIAGNOSIS — Z992 Dependence on renal dialysis: Secondary | ICD-10-CM | POA: Diagnosis not present

## 2018-09-05 DIAGNOSIS — N186 End stage renal disease: Secondary | ICD-10-CM | POA: Diagnosis not present

## 2018-09-05 DIAGNOSIS — D509 Iron deficiency anemia, unspecified: Secondary | ICD-10-CM | POA: Diagnosis not present

## 2018-09-05 DIAGNOSIS — D631 Anemia in chronic kidney disease: Secondary | ICD-10-CM | POA: Diagnosis not present

## 2018-09-06 DIAGNOSIS — N186 End stage renal disease: Secondary | ICD-10-CM | POA: Diagnosis not present

## 2018-09-06 DIAGNOSIS — E782 Mixed hyperlipidemia: Secondary | ICD-10-CM | POA: Diagnosis not present

## 2018-09-06 DIAGNOSIS — I209 Angina pectoris, unspecified: Secondary | ICD-10-CM | POA: Diagnosis not present

## 2018-09-06 DIAGNOSIS — E1122 Type 2 diabetes mellitus with diabetic chronic kidney disease: Secondary | ICD-10-CM | POA: Diagnosis not present

## 2018-09-06 DIAGNOSIS — I2510 Atherosclerotic heart disease of native coronary artery without angina pectoris: Secondary | ICD-10-CM | POA: Diagnosis not present

## 2018-09-06 DIAGNOSIS — N185 Chronic kidney disease, stage 5: Secondary | ICD-10-CM | POA: Diagnosis not present

## 2018-09-07 DIAGNOSIS — D509 Iron deficiency anemia, unspecified: Secondary | ICD-10-CM | POA: Diagnosis not present

## 2018-09-07 DIAGNOSIS — Z23 Encounter for immunization: Secondary | ICD-10-CM | POA: Diagnosis not present

## 2018-09-07 DIAGNOSIS — Z992 Dependence on renal dialysis: Secondary | ICD-10-CM | POA: Diagnosis not present

## 2018-09-07 DIAGNOSIS — N186 End stage renal disease: Secondary | ICD-10-CM | POA: Diagnosis not present

## 2018-09-07 DIAGNOSIS — D631 Anemia in chronic kidney disease: Secondary | ICD-10-CM | POA: Diagnosis not present

## 2018-09-07 LAB — LIPID PANEL
Cholesterol: 105 mg/dL (ref <200)
HDL: 38 mg/dL — ABNORMAL LOW (ref >=40)
LDL Cholesterol (Calc): 52 mg/dL
Non-HDL Cholesterol (Calc): 67 mg/dL (ref <130)
Total CHOL/HDL Ratio: 2.8 (calc) (ref <5.0)
Triglycerides: 69 mg/dL (ref <150)

## 2018-09-07 LAB — HEMOGLOBIN A1C
EAG (MMOL/L): 6.2 (calc)
Hgb A1c MFr Bld: 5.5 % of total Hgb (ref <5.7)
MEAN PLASMA GLUCOSE: 111 (calc)

## 2018-09-07 LAB — VITAMIN D 25 HYDROXY (VIT D DEFICIENCY, FRACTURES): Vit D, 25-Hydroxy: 31 ng/mL (ref 30–100)

## 2018-09-09 ENCOUNTER — Encounter: Payer: Self-pay | Admitting: "Endocrinology

## 2018-09-09 ENCOUNTER — Ambulatory Visit (INDEPENDENT_AMBULATORY_CARE_PROVIDER_SITE_OTHER): Payer: Medicare Other | Admitting: "Endocrinology

## 2018-09-09 ENCOUNTER — Telehealth: Payer: Self-pay | Admitting: *Deleted

## 2018-09-09 ENCOUNTER — Other Ambulatory Visit: Payer: Self-pay

## 2018-09-09 VITALS — BP 125/75 | HR 77 | Temp 98.4°F | Ht 64.0 in | Wt 239.0 lb

## 2018-09-09 DIAGNOSIS — I10 Essential (primary) hypertension: Secondary | ICD-10-CM

## 2018-09-09 DIAGNOSIS — E782 Mixed hyperlipidemia: Secondary | ICD-10-CM | POA: Diagnosis not present

## 2018-09-09 DIAGNOSIS — N185 Chronic kidney disease, stage 5: Secondary | ICD-10-CM

## 2018-09-09 DIAGNOSIS — E1122 Type 2 diabetes mellitus with diabetic chronic kidney disease: Secondary | ICD-10-CM | POA: Diagnosis not present

## 2018-09-09 MED ORDER — INSULIN GLARGINE 100 UNIT/ML SOLOSTAR PEN: PEN_INJECTOR | SUBCUTANEOUS | 0 refills | Status: DC

## 2018-09-09 NOTE — Telephone Encounter (Signed)
Patient called stating he needs his prescriptions to go to [facility]. Please advise   [facility] [phone] fax- [phone]

## 2018-09-09 NOTE — Progress Notes (Signed)
Endocrinology follow-up note   Subjective:    Patient ID: [name] [name], male    DOB: November 08, 1950, PCP [name], MD   Past Medical History:  Diagnosis Date  . Anemia   . Arthritis   . Asthma   . Bell palsy   . CAD (coronary artery disease)    a. 2014 MV: abnl w/ infap ischemia; b. 03/2013 Cath: aneurysmal bleb in the LAD w/ otw nonobs dzs-->Med Rx.  . Chronic back pain   . Chronic knee pain    a. 09/2015 s/p R TKA.  Marland Kitchen Chronic pain   . Chronic shoulder pain   . Chronic sinusitis   . CKD (chronic kidney disease), stage IV ([place])    stage 4 per office visit note of Dr Lowanda Foster on 05/2015   . COPD (chronic obstructive pulmonary disease) ([place])   . Diabetes mellitus without complication ([place])    type II   . Essential hypertension   . GERD (gastroesophageal reflux disease)   . Gout   . Gout   . Hepatomegaly    noted on noncontrast CT 2015  . History of hiatal hernia   . Hyperlipidemia   . Lateral meniscus tear   . Obesity    [place]  . Obstructive sleep apnea    does not use cpap   . On home oxygen therapy    uses 2l when is going somewhere per patient   . PUD (peptic ulcer disease)    remote, reports f/u EGD about 8 years ago unremarkable   . Reactive airway disease    related to exposure to chemical during 9/11  . Renal insufficiency   . Sinusitis   . Vitamin D deficiency    Past Surgical History:  Procedure Laterality Date  . ASAD LT SHOULDER  12/2008   left shoulder  . AV FISTULA PLACEMENT Left 08/09/2016   Procedure: BRACHIOCEPHALIC ARTERIOVENOUS (AV) FISTULA CREATION LEFT ARM;  Surgeon: [name], MD;  Location: [facility];  Service: Vascular;  Laterality: Left;  . CATARACT EXTRACTION W/PHACO Left 03/28/2016   Procedure: CATARACT EXTRACTION PHACO AND INTRAOCULAR LENS PLACEMENT LEFT EYE;  Surgeon: [name], MD;  Location: [facility];  Service: Ophthalmology;  Laterality: Left;  CDE: 4.77  . CATARACT EXTRACTION W/PHACO Right 04/11/2016   Procedure:  CATARACT EXTRACTION PHACO AND INTRAOCULAR LENS PLACEMENT RIGHT EYE; CDE:  4.74;  Surgeon: [name], MD;  Location: [facility];  Service: Ophthalmology;  Laterality: Right;  . COLONOSCOPY  10/2008   Fields: Rectal polyp obliterated, not retrieved, hemorrhoids, single ascending colon diverticulum near the CV. Next colonoscopy April 2020  . COLONOSCOPY N/A 12/25/2014   SLF: 1. Colorectal polyps (2) removed 2. Small internal hemorrhoids 3. the left colon is severely redundant  . DOPPLER ECHOCARDIOGRAPHY    . ESOPHAGOGASTRODUODENOSCOPY N/A 12/25/2014   SLF: 1. Anemia most likely due to CRI, gastritis, gastric polyps 2. Moderate non-erosive gastriits and mild duodenitis.  3.TWo large gstric polyps removed.   Marland Kitchen EYE SURGERY  12/22/2010   tear duct probing-[place]  . FOREIGN BODY REMOVAL  03/29/2011   Procedure: REMOVAL FOREIGN BODY EXTREMITY;  Surgeon: Arther Abbott, MD;  Location: [facility];  Service: Orthopedics;  Laterality: Right;  Removal Foreign Body Right Thumb  . IR FLUORO GUIDE CV LINE RIGHT  08/06/2018  . IR US GUIDE VASC ACCESS RIGHT  08/06/2018  . KNEE ARTHROSCOPY  10/2007   left  . KNEE ARTHROSCOPY WITH LATERAL MENISECTOMY Right 10/14/2015   Procedure: LEFT KNEE ARTHROSCOPY  WITH PARTIAL LATERAL MENISECTOMY;  Surgeon: Carole Civil, MD;  Location: [facility];  Service: Orthopedics;  Laterality: Right;  . LEFT HEART CATHETERIZATION WITH CORONARY ANGIOGRAM N/A 03/28/2013   Procedure: LEFT HEART CATHETERIZATION WITH CORONARY ANGIOGRAM;  Surgeon: Leonie Man, MD;  Location[facility];  Service: Cardiovascular;  Laterality: N/A;  . NM MYOVIEW LTD    . PENILE PROSTHESIS IMPLANT N/A 08/16/2015   Procedure: PENILE PROTHESIS INFLATABLE, three piece, Excisional biopsy of Penile ulcer, Penile molding;  Surgeon: Carolan Clines, MD;  Location: [facility];  Service: Urology;  Laterality: N/A;  . PENILE PROSTHESIS IMPLANT N/A 12/24/2017   Procedure: REMOVAL AND  REPLACEMENT  COLOPLAST PENILE PROSTHESIS;   Surgeon: Lucas Mallow, MD;  Location: [facility];  Service: Urology;  Laterality: N/A;  . QUADRICEPS TENDON REPAIR  07/21/2011   Procedure: REPAIR QUADRICEP TENDON;  Surgeon: Arther Abbott, MD;  Location: [facility];  Service: Orthopedics;  Laterality: Right;  . TOENAIL EXCISION     removed [id number]  . UMBILICAL HERNIA REPAIR  2007   [place]   Social History   Socioeconomic History  . Marital status: Married    Spouse name: Not on file  . Number of children: 2  . Years of education: 12th grade  . Highest education level: Not on file  Occupational History  . Occupation: disabled  . Occupation:      Employer: UNEMPLOYED  Social Needs  . Financial resource strain: Not on file  . Food insecurity:    Worry: Not on file    Inability: Not on file  . Transportation needs:    Medical: Not on file    Non-medical: Not on file  Tobacco Use  . Smoking status: Former Smoker    Packs/day: 1.00    Years: 25.00    Pack years: 25.00    Types: Cigarettes    Last attempt to quit: 03/27/2010    Years since quitting: 8.4  . Smokeless tobacco: Never Used  . Tobacco comment: Quit x 7 years  Substance and Sexual Activity  . Alcohol use: Yes    Alcohol/week: 0.0 standard drinks    Comment: occasionally  . Drug use: Yes    Types: Marijuana    Comment: cocaine- last time used- 11/24/2017 , marijuana-   . Sexual activity: Not on file  Lifestyle  . Physical activity:    Days per week: Not on file    Minutes per session: Not on file  . Stress: Not on file  Relationships  . Social connections:    Talks on phone: Not on file    Gets together: Not on file    Attends religious service: Not on file    Active member of club or organization: Not on file    Attends meetings of clubs or organizations: Not on file    Relationship status: Not on file  Other Topics Concern  . Not on file  Social History Narrative   He quit smoking in 2010. 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 He developed pulmonary problems, became disabled because of lower airway disease in 2009.       WATCHES BASKETBALL. HIS TEAM IS [place].   Outpatient Encounter Medications as of 09/09/2018  Medication Sig  . acetaminophen (TYLENOL) 650 MG CR tablet Take 1,300 mg by mouth daily as needed for pain.  Marland Kitchen allopurinol (ZYLOPRIM) 100 MG tablet Take 100 mg by mouth daily.  Marland Kitchen aspirin EC  81 MG tablet Take 1 tablet (81 mg total) by mouth daily.  . budesonide-formoterol (SYMBICORT) 160-4.5 MCG/ACT inhaler Inhale 2 puffs into the lungs 2 (two) times daily as needed (Shortness of Breath).   . calcitRIOL (ROCALTROL) 0.25 MCG capsule Take 0.25 mcg by mouth daily.  . cetirizine (ZYRTEC) 10 MG tablet Take 10 mg by mouth at bedtime as needed for allergies.   . folic acid (FOLVITE) 1 MG tablet Take 1 tablet by mouth daily.  . Insulin Glargine (LANTUS SOLOSTAR) 100 UNIT/ML Solostar Pen INJECT 20 UNITS SUBCUTANEOUSLY AT BEDTIME.  . Insulin Pen Needle (B-D UF III MINI PEN NEEDLES) 31G X 5 MM MISC INJECT SUBCUTANEOUSLY TWICE DAILY AS DIRECTED  . mometasone (NASONEX) 50 MCG/ACT nasal spray Place 2 sprays into the nose 2 (two) times daily as needed (allergies).   . pantoprazole (PROTONIX) 40 MG tablet TAKE 1 TABLET TWICE DAILY 30 MINUTES PRIOR TO MEALS (Patient taking differently: Take 40 mg by mouth daily. TAKE 1 TABLET ONCE DAILY 30 MINUTES PRIOR TO MEAL)  . simvastatin (ZOCOR) 20 MG tablet Take 20 mg by mouth every morning.   . TRADJENTA 5 MG TABS tablet TAKE 1 TABLET EVERY DAY  . [DISCONTINUED] Insulin Glargine (LANTUS SOLOSTAR) 100 UNIT/ML Solostar Pen INJECT 30 UNITS SUBCUTANEOUSLY AT BEDTIME.  . [DISCONTINUED] diclofenac sodium (VOLTAREN) 1 % GEL Apply 1 application topically 2 (two) times daily as needed (muscle pain).  . [DISCONTINUED] epoetin alfa (EPOGEN,PROCRIT) 4000 UNIT/ML injection Inject 8,000 Units into the skin as needed (Given if hgb < 10).   . [DISCONTINUED]  lidocaine-prilocaine (EMLA) cream Apply 1 application topically daily as needed.   No facility-administered encounter medications on file as of 09/09/2018.    ALLERGIES: Allergies  Allergen Reactions  . Tramadol Hcl   . Opana [Oxymorphone Hcl] Itching  . Oxymorphone Itching  . Tramadol Itching   VACCINATION STATUS: Immunization History  Administered Date(s) Administered  . Td 03/27/2011    Diabetes  He presents for his follow-up diabetic visit. He has type 2 diabetes mellitus. Onset time: He was diagnosed approximately 20 years ago. His disease course has been improving. There are no hypoglycemic associated symptoms. Pertinent negatives for hypoglycemia include no confusion, headaches, pallor or seizures. Pertinent negatives for diabetes include no chest pain, no fatigue, no polydipsia, no polyphagia, no polyuria and no weakness. There are no hypoglycemic complications. Symptoms are improving. Diabetic complications include nephropathy. (Patient with end-stage renal disease now on hemodialysis.) Risk factors for coronary artery disease include dyslipidemia, diabetes mellitus, hypertension, obesity, sedentary lifestyle, tobacco exposure and male sex. Current diabetic treatment includes insulin injections and oral agent (monotherapy). He is compliant with treatment most of the time. His weight is fluctuating minimally. He is following a generally unhealthy diet. He has had a previous visit with a dietitian. He rarely participates in exercise. His home blood glucose trend is decreasing steadily. His breakfast blood glucose range is generally 110-130 mg/dl. His overall blood glucose range is 110-130 mg/dl. Eye exam is current.  Hypertension  This is a chronic problem. The current episode started more than 1 year ago. The problem is controlled. Pertinent negatives include no chest pain, headaches, neck pain, palpitations or shortness of breath. Risk factors for coronary artery disease include diabetes  mellitus, dyslipidemia, obesity, smoking/tobacco exposure, sedentary lifestyle and male gender. Past treatments include angiotensin blockers. Hypertensive end-organ damage includes kidney disease.  Hyperlipidemia  This is a chronic problem. The current episode started more than 1 year ago. Exacerbating diseases include diabetes and  obesity. Pertinent negatives include no chest pain, myalgias or shortness of breath. Current antihyperlipidemic treatment includes statins. Risk factors for coronary artery disease include diabetes mellitus, dyslipidemia, hypertension, male sex and a sedentary lifestyle.     Review of Systems  Constitutional: Negative for chills, fatigue, fever and unexpected weight change.  HENT: Negative for dental problem, mouth sores and trouble swallowing.   Eyes: Negative for visual disturbance.  Respiratory: Negative for cough, choking, chest tightness, shortness of breath and wheezing.   Cardiovascular: Negative for chest pain, palpitations and leg swelling.  Gastrointestinal: Negative for abdominal distention, abdominal pain, constipation, diarrhea, nausea and vomiting.  Endocrine: Negative for polydipsia, polyphagia and polyuria.  Genitourinary: Negative for dysuria, flank pain, hematuria and urgency.  Musculoskeletal: Negative for back pain, gait problem, myalgias and neck pain.  Skin: Negative for pallor, rash and wound.  Neurological: Negative for seizures, syncope, weakness, numbness and headaches.  Psychiatric/Behavioral: Negative for confusion and dysphoric mood.    Objective:    BP 125/75 (BP Location: Right Arm, Patient Position: Sitting, Cuff Size: Large)   Pulse 77   Temp 98.4 F (36.9 C) (Oral)   Ht 5\' 4"  (1.626 m)   Wt 239 lb (108.4 kg)   SpO2 97%   BMI 41.02 kg/m   Wt Readings from Last 3 Encounters:  09/09/18 239 lb (108.4 kg)  08/29/18 233 lb (105.7 kg)  08/16/18 242 lb (109.8 kg)    Physical Exam  Constitutional: He is oriented to person,  place, and time. He appears well-developed and well-nourished. He is cooperative. No distress.  HENT:  Head: Normocephalic and atraumatic.  Eyes: EOM are normal.  Neck: Normal range of motion. Neck supple. No tracheal deviation present. No thyromegaly present.  Cardiovascular: Normal rate, S1 normal, S2 normal and normal heart sounds. Exam reveals no gallop.  No murmur heard. Pulses:      Dorsalis pedis pulses are 1+ on the right side and 1+ on the left side.       Posterior tibial pulses are 1+ on the right side and 1+ on the left side.  Pulmonary/Chest: Effort normal and breath sounds normal. No respiratory distress. He has no wheezes.  Abdominal: Soft. Bowel sounds are normal. He exhibits no distension. There is no abdominal tenderness. There is no guarding and no CVA tenderness.  Musculoskeletal:        General: No edema.     Right shoulder: He exhibits no swelling and no deformity.  Neurological: He is alert and oriented to person, place, and time. He has normal strength and normal reflexes. No cranial nerve deficit or sensory deficit. Gait normal.  Skin: Skin is warm and dry. No rash noted. No cyanosis. Nails show no clubbing.  Psychiatric: He has a normal mood and affect. His speech is normal. Judgment normal. Cognition and memory are normal.    Results for orders placed or performed during the hospital encounter of 08/29/18  MRSA PCR Screening  Result Value Ref Range   MRSA by PCR NEGATIVE NEGATIVE  NM Myocar Multi W/Spect W/Wall Motion / EF  Result Value Ref Range   Rest HR 70 bpm   Rest BP 120/83 mmHg   Peak HR 86 bpm   Peak BP 143/85 mmHg   SSS 4    SRS 2    SDS 2    LHR 0.34    TID 0.92    LV sys vol 78 mL   LV dias vol 174 62 - 150 mL  Basic metabolic  panel  Result Value Ref Range   Sodium 141 135 - 145 mmol/L   Potassium 4.1 3.5 - 5.1 mmol/L   Chloride 105 98 - 111 mmol/L   CO2 28 22 - 32 mmol/L   Glucose, Bld 80 70 - 99 mg/dL   BUN 31 (H) 8 - 23 mg/dL    Creatinine, Ser 7.10 (H) 0.61 - 1.24 mg/dL   Calcium 8.7 (L) 8.9 - 10.3 mg/dL   GFR calc non Af Amer 7 (L) >60 mL/min   GFR calc Af Amer 8 (L) >60 mL/min   Anion gap 8 5 - 15  CBC  Result Value Ref Range   WBC 3.9 (L) 4.0 - 10.5 K/uL   RBC 3.36 (L) 4.22 - 5.81 MIL/uL   Hemoglobin 8.5 (L) 13.0 - 17.0 g/dL   HCT 29.4 (L) 39.0 - 52.0 %   MCV 87.5 80.0 - 100.0 fL   MCH 25.3 (L) 26.0 - 34.0 pg   MCHC 28.9 (L) 30.0 - 36.0 g/dL   RDW 17.9 (H) 11.5 - 15.5 %   Platelets 160 150 - 400 K/uL   nRBC 0.0 0.0 - 0.2 %  Troponin I - ONCE - STAT  Result Value Ref Range   Troponin I 0.05 (HH) <0.03 ng/mL  Urine rapid drug screen (hosp performed)  Result Value Ref Range   Opiates NONE DETECTED NONE DETECTED   Cocaine POSITIVE (A) NONE DETECTED   Benzodiazepines NONE DETECTED NONE DETECTED   Amphetamines NONE DETECTED NONE DETECTED   Tetrahydrocannabinol NONE DETECTED NONE DETECTED   Barbiturates NONE DETECTED NONE DETECTED  HIV antibody (Routine Testing)  Result Value Ref Range   HIV Screen 4th Generation wRfx Non Reactive Non Reactive  Troponin I - Now Then Q6H  Result Value Ref Range   Troponin I 0.05 (HH) <0.03 ng/mL  Troponin I - Now Then Q6H  Result Value Ref Range   Troponin I 0.04 (HH) <0.03 ng/mL  Troponin I - Now Then Q6H  Result Value Ref Range   Troponin I 0.04 (HH) <0.03 ng/mL  Basic metabolic panel  Result Value Ref Range   Sodium 141 135 - 145 mmol/L   Potassium 4.1 3.5 - 5.1 mmol/L   Chloride 106 98 - 111 mmol/L   CO2 23 22 - 32 mmol/L   Glucose, Bld 123 (H) 70 - 99 mg/dL   BUN 42 (H) 8 - 23 mg/dL   Creatinine, Ser 7.79 (H) 0.61 - 1.24 mg/dL   Calcium 8.9 8.9 - 10.3 mg/dL   GFR calc non Af Amer 6 (L) >60 mL/min   GFR calc Af Amer 7 (L) >60 mL/min   Anion gap 12 5 - 15  CBC  Result Value Ref Range   WBC 3.6 (L) 4.0 - 10.5 K/uL   RBC 3.44 (L) 4.22 - 5.81 MIL/uL   Hemoglobin 8.7 (L) 13.0 - 17.0 g/dL   HCT 30.2 (L) 39.0 - 52.0 %   MCV 87.8 80.0 - 100.0 fL   MCH 25.3  (L) 26.0 - 34.0 pg   MCHC 28.8 (L) 30.0 - 36.0 g/dL   RDW 17.7 (H) 11.5 - 15.5 %   Platelets 168 150 - 400 K/uL   nRBC 0.0 0.0 - 0.2 %  Glucose, capillary  Result Value Ref Range   Glucose-Capillary 107 (H) 70 - 99 mg/dL  Glucose, capillary  Result Value Ref Range   Glucose-Capillary 100 (H) 70 - 99 mg/dL  Glucose, capillary  Result Value Ref Range  Glucose-Capillary 115 (H) 70 - 99 mg/dL  Glucose, capillary  Result Value Ref Range   Glucose-Capillary 99 70 - 99 mg/dL  ECHOCARDIOGRAM COMPLETE  Result Value Ref Range   Weight 3,728 oz   Height 64 in   BP 148/83 mmHg   Diabetic Labs (most recent): Lab Results  Component Value Date   HGBA1C 5.5 09/06/2018   HGBA1C 6.5 (H) 03/04/2018   HGBA1C 6.2 (H) 10/23/2017   Lipid Panel     Component Value Date/Time   CHOL 105 09/06/2018 1018   TRIG 69 09/06/2018 1018   HDL 38 (L) 09/06/2018 1018   CHOLHDL 2.8 09/06/2018 1018   VLDL 38 (H) 06/16/2016 0956   LDLCALC 52 09/06/2018 1018     Assessment & Plan:   1. Type 2 diabetes mellitus with stage 4 chronic kidney disease, with long-term current use of insulin (HCC)  -His  Diabetes is complicated by end-stage renal disease recently initiated on hemodialysis,  and patient remains at a high risk for more acute and chronic complications of diabetes which include CAD, CVA, CKD, retinopathy, and neuropathy. These are all discussed in detail with the patient.  -He returns with significantly improved A1c of 5.5%, generally improving from 7.4%.    Recent labs reviewed. - I have re-counseled the patient on diet management and weight loss  by adopting a carbohydrate restricted / protein rich  Diet.  - Patient admits there is a room for improvement in his diet and drink choices. -  Suggestion is made for him to avoid simple carbohydrates  from his diet including Cakes, Sweet Desserts / Pastries, Ice Cream, Soda (diet and regular), Sweet Tea, Candies, Chips, Cookies, Store Bought Juices,  Alcohol in Excess of  1-2 drinks a day, Artificial Sweeteners, and "Sugar-free" Products. This will help patient to have stable blood glucose profile and potentially avoid unintended weight gain.  - Patient is advised to stick to a routine mealtimes to eat 3 meals  a day and avoid unnecessary snacks ( to snack only to correct hypoglycemia). I have also advised him to cut back on alcohol consumption.   - I have approached patient with the following individualized plan to manage diabetes and patient agrees.   -He is advised to stay away from alcohol and smoking. -Based on his glycemic profile, he will need a lower dose of insulin.   -He is advised to lower his Lantus to 20 units nightly  associated with monitoring of blood glucose at least 2 times daily-daily before breakfast and at bedtime.   -He is advised to stay off of Humalog for now.  He will continue Tradjenta 5 mg p.o. daily at breakfast.  He is following with Jearld Fenton, CDE for DM education.   -He will continue to follow-up with nephrology  given stage 4-5 CKD, he has a dialysis fistula in place.    -Patient is not a candidate for metformin and SGLT2 inhibitors due to CKD.  - Patient specific target  for A1c; LDL, HDL, Triglycerides, and  Waist Circumference were discussed in detail.  2) BP/HTN: His blood pressure is controlled to target.    His blood pressure is controlled to target.   He is currently on amlodipine and hydralazine for blood pressure control.    3) Lipids/HPL: His recent lipid panel showed controlled LDL at 54.  He is advised to continue simvastatin 20 mg p.o. nightly.   4)  Weight/Diet: He continued to lose weight, lost 15 pounds over the last 6  months.  CDE consult in progress, exercise, and carbohydrates information provided.   5) Chronic Care/Health Maintenance:  -Patient is on ACEI/ARB and Statin medications and encouraged to continue to follow up with Ophthalmology, nephrology (he is status post fistula  placement in prep for hemodialysis) Podiatrist at least yearly or according to recommendations, and advised to quit smoking. I have recommended yearly flu vaccine and pneumonia vaccination at least every 5 years; moderate intensity exercise for up to 150 minutes weekly; and  sleep for at least 7 hours a day. - I advised patient to maintain close follow up with his PCP for primary care needs.  - Time spent with the patient: 25 min, of which >50% was spent in reviewing his blood glucose logs , discussing his hypoglycemia and hyperglycemia episodes, reviewing his current and  previous labs / studies and medications  doses and developing a plan to avoid hypoglycemia and hyperglycemia. Please refer to Patient Instructions for Blood Glucose Monitoring and Insulin/Medications Dosing Guide"  in media tab for additional information. [name] [name] participated in the discussions, expressed understanding, and voiced agreement with the above plans.  All questions were answered to his satisfaction. he is encouraged to contact clinic should he have any questions or concerns prior to his return visit.    Follow up plan: -Return in about 3 months (around 12/08/2018) for Follow up with Pre-visit Labs, Meter, and Logs.  Glade Lloyd, MD Phone: [phone]  Fax: [phone]   This note was partially dictated with voice recognition software. Similar sounding words can be transcribed inadequately or may not  be corrected upon review.  09/09/2018, 10:02 AM

## 2018-09-09 NOTE — Patient Instructions (Signed)

## 2018-09-10 DIAGNOSIS — Z23 Encounter for immunization: Secondary | ICD-10-CM | POA: Diagnosis not present

## 2018-09-10 DIAGNOSIS — Z992 Dependence on renal dialysis: Secondary | ICD-10-CM | POA: Diagnosis not present

## 2018-09-10 DIAGNOSIS — N186 End stage renal disease: Secondary | ICD-10-CM | POA: Diagnosis not present

## 2018-09-10 DIAGNOSIS — D631 Anemia in chronic kidney disease: Secondary | ICD-10-CM | POA: Diagnosis not present

## 2018-09-10 DIAGNOSIS — D509 Iron deficiency anemia, unspecified: Secondary | ICD-10-CM | POA: Diagnosis not present

## 2018-09-10 MED ORDER — INSULIN PEN NEEDLE 31G X 5 MM MISC: 2 refills | Status: DC

## 2018-09-10 MED ORDER — INSULIN GLARGINE 100 UNIT/ML SOLOSTAR PEN: PEN_INJECTOR | SUBCUTANEOUS | 0 refills | Status: DC

## 2018-09-10 MED ORDER — LINAGLIPTIN 5 MG PO TABS: 5.0000 mg | ORAL_TABLET | Freq: Every day | ORAL | 0 refills | Status: DC

## 2018-09-10 NOTE — Addendum Note (Signed)
Addended by: Lavell Luster A on: 09/10/2018 02:33 PM   Modules accepted: Orders

## 2018-09-11 DIAGNOSIS — N186 End stage renal disease: Secondary | ICD-10-CM | POA: Diagnosis not present

## 2018-09-12 DIAGNOSIS — D631 Anemia in chronic kidney disease: Secondary | ICD-10-CM | POA: Diagnosis not present

## 2018-09-12 DIAGNOSIS — D509 Iron deficiency anemia, unspecified: Secondary | ICD-10-CM | POA: Diagnosis not present

## 2018-09-12 DIAGNOSIS — Z23 Encounter for immunization: Secondary | ICD-10-CM | POA: Diagnosis not present

## 2018-09-12 DIAGNOSIS — Z992 Dependence on renal dialysis: Secondary | ICD-10-CM | POA: Diagnosis not present

## 2018-09-12 DIAGNOSIS — N186 End stage renal disease: Secondary | ICD-10-CM | POA: Diagnosis not present

## 2018-09-13 ENCOUNTER — Ambulatory Visit: Payer: Self-pay | Admitting: General Surgery

## 2018-09-13 DIAGNOSIS — B351 Tinea unguium: Secondary | ICD-10-CM | POA: Diagnosis not present

## 2018-09-13 DIAGNOSIS — D509 Iron deficiency anemia, unspecified: Secondary | ICD-10-CM | POA: Diagnosis not present

## 2018-09-13 DIAGNOSIS — N186 End stage renal disease: Secondary | ICD-10-CM | POA: Diagnosis not present

## 2018-09-13 DIAGNOSIS — D631 Anemia in chronic kidney disease: Secondary | ICD-10-CM | POA: Diagnosis not present

## 2018-09-13 DIAGNOSIS — Z992 Dependence on renal dialysis: Secondary | ICD-10-CM | POA: Diagnosis not present

## 2018-09-13 DIAGNOSIS — E1142 Type 2 diabetes mellitus with diabetic polyneuropathy: Secondary | ICD-10-CM | POA: Diagnosis not present

## 2018-09-13 DIAGNOSIS — Z23 Encounter for immunization: Secondary | ICD-10-CM | POA: Diagnosis not present

## 2018-09-14 DIAGNOSIS — Z992 Dependence on renal dialysis: Secondary | ICD-10-CM | POA: Diagnosis not present

## 2018-09-14 DIAGNOSIS — N186 End stage renal disease: Secondary | ICD-10-CM | POA: Diagnosis not present

## 2018-09-17 DIAGNOSIS — Z950 Presence of cardiac pacemaker: Secondary | ICD-10-CM | POA: Diagnosis not present

## 2018-09-17 DIAGNOSIS — Z992 Dependence on renal dialysis: Secondary | ICD-10-CM | POA: Diagnosis not present

## 2018-09-17 DIAGNOSIS — D509 Iron deficiency anemia, unspecified: Secondary | ICD-10-CM | POA: Diagnosis not present

## 2018-09-17 DIAGNOSIS — D631 Anemia in chronic kidney disease: Secondary | ICD-10-CM | POA: Diagnosis not present

## 2018-09-17 DIAGNOSIS — N186 End stage renal disease: Secondary | ICD-10-CM | POA: Diagnosis not present

## 2018-09-19 DIAGNOSIS — D509 Iron deficiency anemia, unspecified: Secondary | ICD-10-CM | POA: Diagnosis not present

## 2018-09-19 DIAGNOSIS — D631 Anemia in chronic kidney disease: Secondary | ICD-10-CM | POA: Diagnosis not present

## 2018-09-19 DIAGNOSIS — N186 End stage renal disease: Secondary | ICD-10-CM | POA: Diagnosis not present

## 2018-09-19 DIAGNOSIS — Z992 Dependence on renal dialysis: Secondary | ICD-10-CM | POA: Diagnosis not present

## 2018-09-21 DIAGNOSIS — N186 End stage renal disease: Secondary | ICD-10-CM | POA: Diagnosis not present

## 2018-09-21 DIAGNOSIS — Z992 Dependence on renal dialysis: Secondary | ICD-10-CM | POA: Diagnosis not present

## 2018-09-21 DIAGNOSIS — D631 Anemia in chronic kidney disease: Secondary | ICD-10-CM | POA: Diagnosis not present

## 2018-09-21 DIAGNOSIS — D509 Iron deficiency anemia, unspecified: Secondary | ICD-10-CM | POA: Diagnosis not present

## 2018-09-24 DIAGNOSIS — D509 Iron deficiency anemia, unspecified: Secondary | ICD-10-CM | POA: Diagnosis not present

## 2018-09-24 DIAGNOSIS — Z992 Dependence on renal dialysis: Secondary | ICD-10-CM | POA: Diagnosis not present

## 2018-09-24 DIAGNOSIS — D631 Anemia in chronic kidney disease: Secondary | ICD-10-CM | POA: Diagnosis not present

## 2018-09-24 DIAGNOSIS — N186 End stage renal disease: Secondary | ICD-10-CM | POA: Diagnosis not present

## 2018-09-25 DIAGNOSIS — Z961 Presence of intraocular lens: Secondary | ICD-10-CM | POA: Diagnosis not present

## 2018-09-25 DIAGNOSIS — H11823 Conjunctivochalasis, bilateral: Secondary | ICD-10-CM | POA: Diagnosis not present

## 2018-09-25 DIAGNOSIS — E119 Type 2 diabetes mellitus without complications: Secondary | ICD-10-CM | POA: Diagnosis not present

## 2018-09-26 ENCOUNTER — Other Ambulatory Visit: Payer: Self-pay

## 2018-09-26 DIAGNOSIS — N186 End stage renal disease: Secondary | ICD-10-CM | POA: Diagnosis not present

## 2018-09-26 DIAGNOSIS — D631 Anemia in chronic kidney disease: Secondary | ICD-10-CM | POA: Diagnosis not present

## 2018-09-26 DIAGNOSIS — Z992 Dependence on renal dialysis: Secondary | ICD-10-CM | POA: Diagnosis not present

## 2018-09-26 DIAGNOSIS — D509 Iron deficiency anemia, unspecified: Secondary | ICD-10-CM | POA: Diagnosis not present

## 2018-09-26 MED ORDER — BASAGLAR KWIKPEN 100 UNIT/ML ~~LOC~~ SOPN: 20.0000 [IU] | PEN_INJECTOR | Freq: Every day | SUBCUTANEOUS | 0 refills | Status: DC

## 2018-09-28 DIAGNOSIS — D631 Anemia in chronic kidney disease: Secondary | ICD-10-CM | POA: Diagnosis not present

## 2018-09-28 DIAGNOSIS — N186 End stage renal disease: Secondary | ICD-10-CM | POA: Diagnosis not present

## 2018-09-28 DIAGNOSIS — D509 Iron deficiency anemia, unspecified: Secondary | ICD-10-CM | POA: Diagnosis not present

## 2018-09-28 DIAGNOSIS — Z992 Dependence on renal dialysis: Secondary | ICD-10-CM | POA: Diagnosis not present

## 2018-10-01 DIAGNOSIS — D509 Iron deficiency anemia, unspecified: Secondary | ICD-10-CM | POA: Diagnosis not present

## 2018-10-01 DIAGNOSIS — D631 Anemia in chronic kidney disease: Secondary | ICD-10-CM | POA: Diagnosis not present

## 2018-10-01 DIAGNOSIS — N186 End stage renal disease: Secondary | ICD-10-CM | POA: Diagnosis not present

## 2018-10-01 DIAGNOSIS — Z992 Dependence on renal dialysis: Secondary | ICD-10-CM | POA: Diagnosis not present

## 2018-10-02 DIAGNOSIS — I10 Essential (primary) hypertension: Secondary | ICD-10-CM | POA: Diagnosis not present

## 2018-10-02 DIAGNOSIS — E1165 Type 2 diabetes mellitus with hyperglycemia: Secondary | ICD-10-CM | POA: Diagnosis not present

## 2018-10-02 DIAGNOSIS — N186 End stage renal disease: Secondary | ICD-10-CM | POA: Diagnosis not present

## 2018-10-02 NOTE — Patient Instructions (Addendum)
[name] [name]  10/02/2018   Your procedure is scheduled on: 10-07-18    Report to [facility] Main  Entrance    Report to Admitting at 5:30 AM    Call this number if you have problems the morning of surgery [phone]    Remember: Do not eat food or drink liquids :After Midnight.    BRUSH YOUR TEETH MORNING OF SURGERY AND RINSE YOUR MOUTH OUT, NO CHEWING GUM CANDY OR MINTS.     Take these medicines the morning of surgery with A SIP OF WATER: Allopurinol (Zyloprim), Amlodipine (Norvasc), Hydralazine, Pantoprazole (Protonix), and Simvastatin (Zocor). You may also bring and use your inhaler as needed.      DO NOT TAKE ANY DIABETIC MEDICATIONS DAY OF YOUR SURGERY                               You may not have any metal on your body including hair pins and              piercings  Do not wear jewelry, cologne, lotions, powders or deodorant             Men may shave face and neck.   Do not bring valuables to the hospital. [place].  Contacts, dentures or bridgework may not be worn into surgery.  Leave suitcase in the car. After surgery it may be brought to your room.     Patients discharged the day of surgery will not be allowed to drive home. IF YOU ARE HAVING SURGERY AND GOING HOME THE SAME DAY, YOU MUST HAVE AN ADULT TO DRIVE YOU HOME AND BE WITH YOU FOR 24 HOURS. YOU MAY GO HOME BY TAXI OR UBER OR ORTHERWISE, BUT AN ADULT MUST ACCOMPANY YOU HOME AND STAY WITH YOU FOR 24 HOURS.    Name and phone number of your driver: [name] [phone]  Special Instructions: N/A              Please read over the following fact sheets you were given: _____________________________________________________________________             How to Manage Your Diabetes Before and After Surgery  Why is it important to control my blood sugar before and after surgery? . Improving blood sugar levels before and after surgery  helps healing and can limit problems. . A way of improving blood sugar control is eating a healthy diet by: o  Eating less sugar and carbohydrates o  Increasing activity/exercise o  Talking with your doctor about reaching your blood sugar goals . High blood sugars (greater than 180 mg/dL) can raise your risk of infections and slow your recovery, so you will need to focus on controlling your diabetes during the weeks before surgery. . Make sure that the doctor who takes care of your diabetes knows about your planned surgery including the date and location.  How do I manage my blood sugar before surgery? . Check your blood sugar at least 4 times a day, starting 2 days before surgery, to make sure that the level is not too high or low. o Check your blood sugar the morning of your surgery when you wake up and every 2 hours until you get to the  Short Stay unit. . If your blood sugar is less than 70 mg/dL, you will need to treat for low blood sugar: o Do not take insulin. o Treat a low blood sugar (less than 70 mg/dL) with  cup of clear juice (cranberry or apple), 4 glucose tablets, OR glucose gel. o Recheck blood sugar in 15 minutes after treatment (to make sure it is greater than 70 mg/dL). If your blood sugar is not greater than 70 mg/dL on recheck, call [phone] for further instructions. . Report your blood sugar to the short stay nurse when you get to Short Stay.  . If you are admitted to the hospital after surgery: o Your blood sugar will be checked by the staff and you will probably be given insulin after surgery (instead of oral diabetes medicines) to make sure you have good blood sugar levels. o The goal for blood sugar control after surgery is 80-180 mg/dL.   WHAT DO I DO ABOUT MY DIABETES MEDICATION?  Marland Kitchen Do not take oral diabetes medicines (pills) the morning of surgery.  . THE DAY BEFORE SURGERY, take your usual dose of Tradjenta, and only 10 units of Lantus insulin at  bedtime.      Reviewed and Endorsed by Dr [facility] Patient Education Committee, August 2015    [facility] - Preparing for Surgery Before surgery, you can play an important role.  Because skin is not sterile, your skin needs to be as free of germs as possible.  You can reduce the number of germs on your skin by washing with CHG (chlorahexidine gluconate) soap before surgery.  CHG is an antiseptic cleaner which kills germs and bonds with the skin to continue killing germs even after washing. Please DO NOT use if you have an allergy to CHG or antibacterial soaps.  If your skin becomes reddened/irritated stop using the CHG and inform your nurse when you arrive at Short Stay. Do not shave (including legs and underarms) for at least 48 hours prior to the first CHG shower.  You may shave your face/neck. Please follow these instructions carefully:  1.  Shower with CHG Soap the night before surgery and the  morning of Surgery.  2.  If you choose to wash your hair, wash your hair first as usual with your  normal  shampoo.  3.  After you shampoo, rinse your hair and body thoroughly to remove the  shampoo.                           4.  Use CHG as you would any other liquid soap.  You can apply chg directly  to the skin and wash                       Gently with a scrungie or clean washcloth.  5.  Apply the CHG Soap to your body ONLY FROM THE NECK DOWN.   Do not use on face/ open                           Wound or open sores. Avoid contact with eyes, ears mouth and genitals (private parts).                       Wash face,  Genitals (private parts) with your normal soap.             6.  Wash thoroughly, paying special attention to the area where your surgery  will be performed.  7.  Thoroughly rinse your body with warm water from the neck down.  8.  DO NOT shower/wash with your normal soap after using and rinsing off  the CHG Soap.                9.  Pat yourself dry with a clean towel.            10.   Wear clean pajamas.            11.  Place clean sheets on your bed the night of your first shower and do not  sleep with pets. Day of Surgery : Do not apply any lotions/deodorants the morning of surgery.  Please wear clean clothes to the hospital/surgery center.  FAILURE TO FOLLOW THESE INSTRUCTIONS MAY RESULT IN THE CANCELLATION OF YOUR SURGERY PATIENT SIGNATURE_________________________________  NURSE SIGNATURE__________________________________  ________________________________________________________________________

## 2018-10-02 NOTE — Progress Notes (Signed)
08-29-18 (Epic) EKG, ECHO, CXR

## 2018-10-03 DIAGNOSIS — Z992 Dependence on renal dialysis: Secondary | ICD-10-CM | POA: Diagnosis not present

## 2018-10-03 DIAGNOSIS — D631 Anemia in chronic kidney disease: Secondary | ICD-10-CM | POA: Diagnosis not present

## 2018-10-03 DIAGNOSIS — N186 End stage renal disease: Secondary | ICD-10-CM | POA: Diagnosis not present

## 2018-10-03 DIAGNOSIS — D509 Iron deficiency anemia, unspecified: Secondary | ICD-10-CM | POA: Diagnosis not present

## 2018-10-04 ENCOUNTER — Encounter (HOSPITAL_COMMUNITY)
Admission: RE | Admit: 2018-10-04 | Discharge: 2018-10-04 | Disposition: A | Discharge status: Home / Self Care | Payer: Medicare Other | Source: Ambulatory Visit | Attending: General Surgery | Admitting: General Surgery

## 2018-10-04 ENCOUNTER — Other Ambulatory Visit: Payer: Self-pay

## 2018-10-04 ENCOUNTER — Encounter (HOSPITAL_COMMUNITY): Payer: Self-pay

## 2018-10-04 DIAGNOSIS — Z9981 Dependence on supplemental oxygen: Secondary | ICD-10-CM | POA: Diagnosis not present

## 2018-10-04 DIAGNOSIS — Z87891 Personal history of nicotine dependence: Secondary | ICD-10-CM | POA: Diagnosis not present

## 2018-10-04 DIAGNOSIS — N186 End stage renal disease: Secondary | ICD-10-CM | POA: Diagnosis not present

## 2018-10-04 DIAGNOSIS — Z79899 Other long term (current) drug therapy: Secondary | ICD-10-CM | POA: Diagnosis not present

## 2018-10-04 DIAGNOSIS — E785 Hyperlipidemia, unspecified: Secondary | ICD-10-CM | POA: Diagnosis not present

## 2018-10-04 DIAGNOSIS — G8929 Other chronic pain: Secondary | ICD-10-CM | POA: Diagnosis not present

## 2018-10-04 DIAGNOSIS — Z01812 Encounter for preprocedural laboratory examination: Secondary | ICD-10-CM | POA: Insufficient documentation

## 2018-10-04 DIAGNOSIS — Z6839 Body mass index (BMI) 39.0-39.9, adult: Secondary | ICD-10-CM | POA: Diagnosis not present

## 2018-10-04 DIAGNOSIS — Z969 Presence of functional implant, unspecified: Secondary | ICD-10-CM | POA: Diagnosis not present

## 2018-10-04 DIAGNOSIS — I120 Hypertensive chronic kidney disease with stage 5 chronic kidney disease or end stage renal disease: Secondary | ICD-10-CM | POA: Diagnosis not present

## 2018-10-04 DIAGNOSIS — I2510 Atherosclerotic heart disease of native coronary artery without angina pectoris: Secondary | ICD-10-CM | POA: Diagnosis not present

## 2018-10-04 DIAGNOSIS — J449 Chronic obstructive pulmonary disease, unspecified: Secondary | ICD-10-CM | POA: Diagnosis not present

## 2018-10-04 DIAGNOSIS — E1122 Type 2 diabetes mellitus with diabetic chronic kidney disease: Secondary | ICD-10-CM | POA: Diagnosis not present

## 2018-10-04 DIAGNOSIS — K219 Gastro-esophageal reflux disease without esophagitis: Secondary | ICD-10-CM | POA: Diagnosis not present

## 2018-10-04 DIAGNOSIS — Z794 Long term (current) use of insulin: Secondary | ICD-10-CM | POA: Diagnosis not present

## 2018-10-04 DIAGNOSIS — Z7982 Long term (current) use of aspirin: Secondary | ICD-10-CM | POA: Diagnosis not present

## 2018-10-04 DIAGNOSIS — M109 Gout, unspecified: Secondary | ICD-10-CM | POA: Diagnosis not present

## 2018-10-04 DIAGNOSIS — G4733 Obstructive sleep apnea (adult) (pediatric): Secondary | ICD-10-CM | POA: Diagnosis not present

## 2018-10-04 DIAGNOSIS — M1990 Unspecified osteoarthritis, unspecified site: Secondary | ICD-10-CM | POA: Diagnosis not present

## 2018-10-04 LAB — HEMOGLOBIN A1C
Hgb A1c MFr Bld: 5.7 % — ABNORMAL HIGH (ref 4.8–5.6)
Mean Plasma Glucose: 116.89 mg/dL

## 2018-10-04 LAB — GLUCOSE, CAPILLARY: Glucose-Capillary: 122 mg/dL — ABNORMAL HIGH (ref 70–99)

## 2018-10-04 NOTE — Progress Notes (Signed)
Anesthesia Chart Review   Case:  [id number] Date/Time:  10/07/18 0715   Procedure:  LAPAROSCOPIC PERITONEAL CATHETER PLACEMENT (N/A )   Anesthesia type:  General   Pre-op diagnosis:  END STAGE RENAL DISEASE   Location:  [place] [facility] / [facility]   Surgeon:  Mickeal Skinner, MD      DISCUSSION: 68 yo former smoker (25 pack years, quit 03/27/10) with h/o COPD (uses 2L O2 prn), OSA, DM II, asthma, PUD, HLD, CKD, GERD, HTN, CAD, end state renal disease scheduled for above procedure 10/07/18 with Dr. Arta Bruce Kinsinger.   Pt recently admitted 08/29/18-08/30/18.  Presented to ED with chest pain, workup negative for cardiac etiology; attributed to cocaine use. Stable at discharge.   Pt can proceed with planned procedure barring acute status change.  VS: BP (!) 141/80 (BP Location: Right Arm)   Pulse 72   Temp 36.8 C (Oral)   Resp 18   Ht 5\' 4"  (1.626 m)   Wt 105 kg   SpO2 98%   BMI 39.73 kg/m   PROVIDERS: [name], MD is PCP   Anderson Malta, MD is Endocrinologist last seen 09/09/18 LABS: Labs reviewed: Acceptable for surgery. (all labs ordered are listed, but only abnormal results are displayed)  Labs Reviewed  GLUCOSE, CAPILLARY - Abnormal; Notable for the following components:      Result Value   Glucose-Capillary 122 (*)    All other components within normal limits  HEMOGLOBIN A1C     IMAGES:   EKG: 08/29/2018 Rate 68 bpm Sinus rhythm  Prolonged PR interval  Abnormal T, consider ischemia, diffuse leads  CV: Stress Test 08/30/18  There was no ST segment deviation noted during stress.  This is a low risk study.  The study is normal. There are no perfusion defects consistent with prior infarct or current ischemia.  The left ventricular ejection fraction is normal (55-65%).   Echo 08/29/18 IMPRESSIONS   1. The left ventricle has low normal systolic function, with an ejection fraction of 50-55%. The cavity size was normal. There is severely increased  left ventricular wall thickness. Left ventricular diastolic Doppler parameters are consistent with  pseudonormalization Elevated mean left atrial pressure.  2. The right ventricle has normal systolic function. The cavity was normal. There is no increase in right ventricular wall thickness.  3. Left atrial size was moderately dilated.  4. Right atrial size was moderately dilated.  5. The mitral valve is normal in structure. No evidence of mitral valve stenosis.  6. The tricuspid valve is normal in structure.  7. The aortic valve is tricuspid There is mild thickening of the aortic valve.  8. The aortic root is normal in size and structure.  9. Right atrial pressure is estimated at 3 mmHg. Past Medical History:  Diagnosis Date  . Anemia   . Arthritis   . Asthma   . Bell palsy   . CAD (coronary artery disease)    a. 2014 MV: abnl w/ infap ischemia; b. 03/2013 Cath: aneurysmal bleb in the LAD w/ otw nonobs dzs-->Med Rx.  . Chronic back pain   . Chronic knee pain    a. 09/2015 s/p R TKA.  Marland Kitchen Chronic pain   . Chronic shoulder pain   . Chronic sinusitis   . CKD (chronic kidney disease), stage IV ([place])    stage 4 per office visit note of Dr Lowanda Foster on 05/2015   . COPD (chronic obstructive pulmonary disease) ([place])   . Diabetes mellitus  without complication ([place])    type II   . Essential hypertension   . GERD (gastroesophageal reflux disease)   . Gout   . Gout   . Hepatomegaly    noted on noncontrast CT 2015  . History of hiatal hernia   . Hyperlipidemia   . Lateral meniscus tear   . Obesity    [place]  . Obstructive sleep apnea    does not use cpap   . On home oxygen therapy    uses 2l when is going somewhere per patient   . PUD (peptic ulcer disease)    remote, reports f/u EGD about 8 years ago unremarkable   . Reactive airway disease    related to exposure to chemical during 9/11  . Renal insufficiency   . Sinusitis   . Vitamin D deficiency     Past Surgical History:   Procedure Laterality Date  . ASAD LT SHOULDER  12/2008   left shoulder  . AV FISTULA PLACEMENT Left 08/09/2016   Procedure: BRACHIOCEPHALIC ARTERIOVENOUS (AV) FISTULA CREATION LEFT ARM;  Surgeon: [name], MD;  Location: [place];  Service: Vascular;  Laterality: Left;  . CATARACT EXTRACTION W/PHACO Left 03/28/2016   Procedure: CATARACT EXTRACTION PHACO AND INTRAOCULAR LENS PLACEMENT LEFT EYE;  Surgeon: [name], MD;  Location: [facility];  Service: Ophthalmology;  Laterality: Left;  CDE: 4.77  . CATARACT EXTRACTION W/PHACO Right 04/11/2016   Procedure: CATARACT EXTRACTION PHACO AND INTRAOCULAR LENS PLACEMENT RIGHT EYE; CDE:  4.74;  Surgeon: [name], MD;  Location: [facility];  Service: Ophthalmology;  Laterality: Right;  . COLONOSCOPY  10/2008   Fields: Rectal polyp obliterated, not retrieved, hemorrhoids, single ascending colon diverticulum near the CV. Next colonoscopy April 2020  . COLONOSCOPY N/A 12/25/2014   SLF: 1. Colorectal polyps (2) removed 2. Small internal hemorrhoids 3. the left colon is severely redundant  . DOPPLER ECHOCARDIOGRAPHY    . ESOPHAGOGASTRODUODENOSCOPY N/A 12/25/2014   SLF: 1. Anemia most likely due to CRI, gastritis, gastric polyps 2. Moderate non-erosive gastriits and mild duodenitis.  3.TWo large gstric polyps removed.   Marland Kitchen EYE SURGERY  12/22/2010   tear duct probing-[place]  . FOREIGN BODY REMOVAL  03/29/2011   Procedure: REMOVAL FOREIGN BODY EXTREMITY;  Surgeon: Arther Abbott, MD;  Location[facility];  Service: Orthopedics;  Laterality: Right;  Removal Foreign Body Right Thumb  . IR FLUORO GUIDE CV LINE RIGHT  08/06/2018  . IR US GUIDE VASC ACCESS RIGHT  08/06/2018  . KNEE ARTHROSCOPY  10/2007   left  . KNEE ARTHROSCOPY WITH LATERAL MENISECTOMY Right 10/14/2015   Procedure: LEFT KNEE ARTHROSCOPY WITH PARTIAL LATERAL MENISECTOMY;  Surgeon: Carole Civil, MD;  Location[facility];  Service: Orthopedics;  Laterality: Right;  . LEFT HEART CATHETERIZATION WITH  CORONARY ANGIOGRAM N/A 03/28/2013   Procedure: LEFT HEART CATHETERIZATION WITH CORONARY ANGIOGRAM;  Surgeon: Leonie Man, MD;  Location: [facility] CATH LAB;  Service: Cardiovascular;  Laterality: N/A;  . NM MYOVIEW LTD    . PENILE PROSTHESIS IMPLANT N/A 08/16/2015   Procedure: PENILE PROTHESIS INFLATABLE, three piece, Excisional biopsy of Penile ulcer, Penile molding;  Surgeon: Carolan Clines, MD;  [facility];  Service: Urology;  Laterality: N/A;  . PENILE PROSTHESIS IMPLANT N/A 12/24/2017   Procedure: REMOVAL AND  REPLACEMENT  COLOPLAST PENILE PROSTHESIS;  Surgeon: Lucas Mallow, MD;  Location[facility];  Service: Urology;  Laterality: N/A;  . QUADRICEPS TENDON REPAIR  07/21/2011   Procedure: REPAIR QUADRICEP TENDON;  Surgeon: Arther Abbott, MD;  Location[facility];  Service: Orthopedics;  Laterality: Right;  . TOENAIL EXCISION     removed [id number]  . UMBILICAL HERNIA REPAIR  2007   [place]    MEDICATIONS: . allopurinol (ZYLOPRIM) 100 MG tablet  . amLODipine (NORVASC) 10 MG tablet  . aspirin EC 81 MG tablet  . budesonide-formoterol (SYMBICORT) 160-4.5 MCG/ACT inhaler  . calcitRIOL (ROCALTROL) 0.25 MCG capsule  . cetirizine (ZYRTEC) 10 MG tablet  . folic acid (FOLVITE) 1 MG tablet  . hydrALAZINE (APRESOLINE) 25 MG tablet  . Insulin Glargine (LANTUS SOLOSTAR) 100 UNIT/ML Solostar Pen  . Insulin Pen Needle (B-D UF III MINI PEN NEEDLES) 31G X 5 MM MISC  . linagliptin (TRADJENTA) 5 MG TABS tablet  . mometasone (NASONEX) 50 MCG/ACT nasal spray  . pantoprazole (PROTONIX) 40 MG tablet  . simvastatin (ZOCOR) 20 MG tablet  . sodium chloride (MURO 128) 5 % ophthalmic ointment   No current facility-administered medications for this encounter.      Maia Plan [facility] Pre-Surgical Testing [phone] 10/04/18 3:21 PM

## 2018-10-04 NOTE — Anesthesia Preprocedure Evaluation (Addendum)
Anesthesia Evaluation  Patient identified by MRN, date of birth, ID band Patient awake    Reviewed: Allergy & Precautions, NPO status , Patient's Chart, lab work & pertinent test results  Airway Mallampati: II  TM Distance: >3 FB Neck ROM: Full    Dental  (+) Dental Advisory Given   Pulmonary asthma , sleep apnea , COPD, former smoker,    Pulmonary exam normal breath sounds clear to auscultation       Cardiovascular hypertension, Pt. on medications + CAD  Normal cardiovascular exam Rhythm:Regular Rate:Normal     Neuro/Psych  Neuromuscular disease    GI/Hepatic Neg liver ROS, hiatal hernia, PUD, GERD  ,  Endo/Other  diabetesMorbid obesity  Renal/GU ESRFRenal disease     Musculoskeletal negative musculoskeletal ROS (+)   Abdominal (+) + obese,   Peds  Hematology  (+) Blood dyscrasia, anemia ,   Anesthesia Other Findings Day of surgery medications reviewed with the patient.  Reproductive/Obstetrics                                                            Anesthesia Evaluation  Patient identified by MRN, date of birth, ID band Patient awake    Reviewed: Allergy & Precautions, NPO status , Patient's Chart, lab work & pertinent test results  Airway Mallampati: II  TM Distance: >3 FB Neck ROM: Full    Dental no notable dental hx.    Pulmonary shortness of breath, sleep apnea , pneumonia, COPD, former smoker,  Uses home O2   Pulmonary exam normal breath sounds clear to auscultation       Cardiovascular hypertension, Pt. on medications + CAD  Normal cardiovascular exam Rhythm:Regular Rate:Normal     Neuro/Psych PSYCHIATRIC DISORDERS negative neurological ROS  negative psych ROS   GI/Hepatic Neg liver ROS, hiatal hernia, PUD, GERD  ,  Endo/Other  diabetes, Type 2Morbid obesity  Renal/GU Renal Insufficiency, ESRF and CRFRenal disease     Musculoskeletal  (+)  Arthritis ,   Abdominal   Peds  Hematology  (+) anemia ,   Anesthesia Other Findings   Reproductive/Obstetrics negative OB ROS                             Anesthesia Physical  Anesthesia Plan  ASA: IV  Anesthesia Plan: MAC   Post-op Pain Management:    Induction: Intravenous  Airway Management Planned:   Additional Equipment:   Intra-op Plan:   Post-operative Plan:   Informed Consent: I have reviewed the patients History and Physical, chart, labs and discussed the procedure including the risks, benefits and alternatives for the proposed anesthesia with the patient or authorized representative who has indicated his/her understanding and acceptance.   Dental advisory given  Plan Discussed with: CRNA and Surgeon  Anesthesia Plan Comments:         Anesthesia Quick Evaluation  Anesthesia Physical Anesthesia Plan  ASA: IV  Anesthesia Plan: General   Post-op Pain Management:    Induction: Intravenous  PONV Risk Score and Plan: 3 and Ondansetron and Treatment may vary due to age or medical condition  Airway Management Planned: Oral ETT  Additional Equipment: None  Intra-op Plan:   Post-operative Plan: Extubation in OR  Informed Consent: I have reviewed the patients  History and Physical, chart, labs and discussed the procedure including the risks, benefits and alternatives for the proposed anesthesia with the patient or authorized representative who has indicated his/her understanding and acceptance.     Dental advisory given  Plan Discussed with: CRNA  Anesthesia Plan Comments: (See PAT note 10/04/18, Konrad Felix, PA-C)       Anesthesia Quick Evaluation

## 2018-10-05 DIAGNOSIS — D509 Iron deficiency anemia, unspecified: Secondary | ICD-10-CM | POA: Diagnosis not present

## 2018-10-05 DIAGNOSIS — D631 Anemia in chronic kidney disease: Secondary | ICD-10-CM | POA: Diagnosis not present

## 2018-10-05 DIAGNOSIS — Z992 Dependence on renal dialysis: Secondary | ICD-10-CM | POA: Diagnosis not present

## 2018-10-05 DIAGNOSIS — N186 End stage renal disease: Secondary | ICD-10-CM | POA: Diagnosis not present

## 2018-10-07 ENCOUNTER — Ambulatory Visit (HOSPITAL_COMMUNITY): Payer: Medicare Other | Admitting: Physician Assistant

## 2018-10-07 ENCOUNTER — Ambulatory Visit (HOSPITAL_COMMUNITY)
Admission: RE | Admit: 2018-10-07 | Discharge: 2018-10-07 | Disposition: A | Discharge status: Home / Self Care | Payer: Medicare Other | Attending: General Surgery | Admitting: General Surgery

## 2018-10-07 ENCOUNTER — Encounter (HOSPITAL_COMMUNITY)
Admission: RE | Disposition: A | Discharge status: Home / Self Care | Payer: Self-pay | Source: Home / Self Care | Attending: General Surgery

## 2018-10-07 ENCOUNTER — Other Ambulatory Visit: Payer: Self-pay

## 2018-10-07 ENCOUNTER — Encounter (HOSPITAL_COMMUNITY): Payer: Self-pay | Admitting: *Deleted

## 2018-10-07 ENCOUNTER — Ambulatory Visit (HOSPITAL_COMMUNITY): Payer: Medicare Other | Admitting: Anesthesiology

## 2018-10-07 DIAGNOSIS — E119 Type 2 diabetes mellitus without complications: Secondary | ICD-10-CM | POA: Diagnosis not present

## 2018-10-07 DIAGNOSIS — K219 Gastro-esophageal reflux disease without esophagitis: Secondary | ICD-10-CM | POA: Insufficient documentation

## 2018-10-07 DIAGNOSIS — N186 End stage renal disease: Secondary | ICD-10-CM | POA: Insufficient documentation

## 2018-10-07 DIAGNOSIS — J449 Chronic obstructive pulmonary disease, unspecified: Secondary | ICD-10-CM | POA: Insufficient documentation

## 2018-10-07 DIAGNOSIS — M109 Gout, unspecified: Secondary | ICD-10-CM | POA: Insufficient documentation

## 2018-10-07 DIAGNOSIS — I2510 Atherosclerotic heart disease of native coronary artery without angina pectoris: Secondary | ICD-10-CM | POA: Diagnosis not present

## 2018-10-07 DIAGNOSIS — Z6839 Body mass index (BMI) 39.0-39.9, adult: Secondary | ICD-10-CM | POA: Insufficient documentation

## 2018-10-07 DIAGNOSIS — I120 Hypertensive chronic kidney disease with stage 5 chronic kidney disease or end stage renal disease: Secondary | ICD-10-CM | POA: Diagnosis not present

## 2018-10-07 DIAGNOSIS — Z794 Long term (current) use of insulin: Secondary | ICD-10-CM | POA: Insufficient documentation

## 2018-10-07 DIAGNOSIS — M1990 Unspecified osteoarthritis, unspecified site: Secondary | ICD-10-CM | POA: Insufficient documentation

## 2018-10-07 DIAGNOSIS — G4733 Obstructive sleep apnea (adult) (pediatric): Secondary | ICD-10-CM | POA: Insufficient documentation

## 2018-10-07 DIAGNOSIS — Z9981 Dependence on supplemental oxygen: Secondary | ICD-10-CM | POA: Insufficient documentation

## 2018-10-07 DIAGNOSIS — Z969 Presence of functional implant, unspecified: Secondary | ICD-10-CM | POA: Insufficient documentation

## 2018-10-07 DIAGNOSIS — G8929 Other chronic pain: Secondary | ICD-10-CM | POA: Insufficient documentation

## 2018-10-07 DIAGNOSIS — E785 Hyperlipidemia, unspecified: Secondary | ICD-10-CM | POA: Insufficient documentation

## 2018-10-07 DIAGNOSIS — Z87891 Personal history of nicotine dependence: Secondary | ICD-10-CM | POA: Insufficient documentation

## 2018-10-07 DIAGNOSIS — E1122 Type 2 diabetes mellitus with diabetic chronic kidney disease: Secondary | ICD-10-CM | POA: Diagnosis not present

## 2018-10-07 DIAGNOSIS — Z4902 Encounter for fitting and adjustment of peritoneal dialysis catheter: Secondary | ICD-10-CM | POA: Diagnosis not present

## 2018-10-07 DIAGNOSIS — Z79899 Other long term (current) drug therapy: Secondary | ICD-10-CM | POA: Insufficient documentation

## 2018-10-07 DIAGNOSIS — Z7982 Long term (current) use of aspirin: Secondary | ICD-10-CM | POA: Insufficient documentation

## 2018-10-07 HISTORY — PX: CAPD INSERTION: SHX5233

## 2018-10-07 LAB — BASIC METABOLIC PANEL
Anion gap: 13 (ref 5–15)
BUN: 42 mg/dL — ABNORMAL HIGH (ref 8–23)
CHLORIDE: 104 mmol/L (ref 98–111)
CO2: 21 mmol/L — ABNORMAL LOW (ref 22–32)
Calcium: 8.8 mg/dL — ABNORMAL LOW (ref 8.9–10.3)
Creatinine, Ser: 7.29 mg/dL — ABNORMAL HIGH (ref 0.61–1.24)
GFR calc Af Amer: 8 mL/min — ABNORMAL LOW (ref >60)
GFR calc non Af Amer: 7 mL/min — ABNORMAL LOW (ref >60)
Glucose, Bld: 137 mg/dL — ABNORMAL HIGH (ref 70–99)
POTASSIUM: 3.7 mmol/L (ref 3.5–5.1)
Sodium: 138 mmol/L (ref 135–145)

## 2018-10-07 LAB — GLUCOSE, CAPILLARY
Glucose-Capillary: 154 mg/dL — ABNORMAL HIGH (ref 70–99)
Glucose-Capillary: 155 mg/dL — ABNORMAL HIGH (ref 70–99)

## 2018-10-07 LAB — CBC WITH DIFFERENTIAL/PLATELET
Abs Immature Granulocytes: 0.01 10*3/uL (ref 0.00–0.07)
Basophils Absolute: 0 10*3/uL (ref 0.0–0.1)
Basophils Relative: 1 %
Eosinophils Absolute: 0.3 10*3/uL (ref 0.0–0.5)
Eosinophils Relative: 7 %
HCT: 35.7 % — ABNORMAL LOW (ref 39.0–52.0)
Hemoglobin: 10.2 g/dL — ABNORMAL LOW (ref 13.0–17.0)
IMMATURE GRANULOCYTES: 0 %
Lymphocytes Relative: 17 %
Lymphs Abs: 0.8 10*3/uL (ref 0.7–4.0)
MCH: 25.8 pg — ABNORMAL LOW (ref 26.0–34.0)
MCHC: 28.6 g/dL — ABNORMAL LOW (ref 30.0–36.0)
MCV: 90.2 fL (ref 80.0–100.0)
MONOS PCT: 11 %
Monocytes Absolute: 0.5 10*3/uL (ref 0.1–1.0)
Neutro Abs: 3.1 10*3/uL (ref 1.7–7.7)
Neutrophils Relative %: 64 %
Platelets: 193 10*3/uL (ref 150–400)
RBC: 3.96 MIL/uL — ABNORMAL LOW (ref 4.22–5.81)
RDW: 17.9 % — ABNORMAL HIGH (ref 11.5–15.5)
WBC: 4.8 10*3/uL (ref 4.0–10.5)
nRBC: 0 % (ref 0.0–0.2)

## 2018-10-07 SURGERY — LAPAROSCOPIC INSERTION CONTINUOUS AMBULATORY PERITONEAL DIALYSIS  (CAPD) CATHETER
Anesthesia: General | Site: Abdomen

## 2018-10-07 MED ORDER — ONDANSETRON HCL 4 MG/2ML IJ SOLN
INTRAMUSCULAR | Status: AC
  Filled 2018-10-07: qty 2

## 2018-10-07 MED ORDER — CHLORHEXIDINE GLUCONATE CLOTH 2 % EX PADS: 6.0000 | MEDICATED_PAD | Freq: Once | CUTANEOUS | Status: DC

## 2018-10-07 MED ORDER — LIDOCAINE 2% (20 MG/ML) 5 ML SYRINGE
INTRAMUSCULAR | Status: DC | PRN
  Administered 2018-10-07: 60 mg via INTRAVENOUS

## 2018-10-07 MED ORDER — OXYCODONE HCL 5 MG PO TABS: 5.0000 mg | ORAL_TABLET | Freq: Four times a day (QID) | ORAL | 0 refills | Status: DC | PRN

## 2018-10-07 MED ORDER — FENTANYL CITRATE (PF) 100 MCG/2ML IJ SOLN: 25.0000 ug | INTRAMUSCULAR | Status: DC | PRN

## 2018-10-07 MED ORDER — PROMETHAZINE HCL 25 MG/ML IJ SOLN: 6.2500 mg | INTRAMUSCULAR | Status: DC | PRN

## 2018-10-07 MED ORDER — PROPOFOL 10 MG/ML IV BOLUS
INTRAVENOUS | Status: AC
  Filled 2018-10-07: qty 20

## 2018-10-07 MED ORDER — FENTANYL CITRATE (PF) 100 MCG/2ML IJ SOLN
INTRAMUSCULAR | Status: AC
  Filled 2018-10-07: qty 2

## 2018-10-07 MED ORDER — SODIUM CHLORIDE 0.9 % IR SOLN
Status: DC | PRN
  Administered 2018-10-07: 500 mL

## 2018-10-07 MED ORDER — ONDANSETRON HCL 4 MG/2ML IJ SOLN
INTRAMUSCULAR | Status: DC | PRN
  Administered 2018-10-07: 4 mg via INTRAVENOUS

## 2018-10-07 MED ORDER — DEXAMETHASONE SODIUM PHOSPHATE 10 MG/ML IJ SOLN
INTRAMUSCULAR | Status: AC
  Filled 2018-10-07: qty 1

## 2018-10-07 MED ORDER — SUGAMMADEX SODIUM 500 MG/5ML IV SOLN
INTRAVENOUS | Status: AC
  Filled 2018-10-07: qty 5

## 2018-10-07 MED ORDER — BUPIVACAINE-EPINEPHRINE (PF) 0.25% -1:200000 IJ SOLN
INTRAMUSCULAR | Status: DC | PRN
  Administered 2018-10-07: 30 mL

## 2018-10-07 MED ORDER — LIDOCAINE 2% (20 MG/ML) 5 ML SYRINGE
INTRAMUSCULAR | Status: AC
  Filled 2018-10-07: qty 5

## 2018-10-07 MED ORDER — KETAMINE HCL 10 MG/ML IJ SOLN
INTRAMUSCULAR | Status: AC
  Filled 2018-10-07: qty 1

## 2018-10-07 MED ORDER — BUPIVACAINE-EPINEPHRINE (PF) 0.25% -1:200000 IJ SOLN
INTRAMUSCULAR | Status: AC
  Filled 2018-10-07: qty 30

## 2018-10-07 MED ORDER — MEPERIDINE HCL 50 MG/ML IJ SOLN: 6.2500 mg | INTRAMUSCULAR | Status: DC | PRN

## 2018-10-07 MED ORDER — ACETAMINOPHEN 500 MG PO TABS
1000.0000 mg | ORAL_TABLET | ORAL | Status: AC
  Administered 2018-10-07: 1000 mg via ORAL
  Filled 2018-10-07: qty 2

## 2018-10-07 MED ORDER — KETAMINE HCL 10 MG/ML IJ SOLN
INTRAMUSCULAR | Status: DC | PRN
  Administered 2018-10-07 (×2): 15 mg via INTRAVENOUS

## 2018-10-07 MED ORDER — SUCCINYLCHOLINE CHLORIDE 200 MG/10ML IV SOSY
PREFILLED_SYRINGE | INTRAVENOUS | Status: DC | PRN
  Administered 2018-10-07: 140 mg via INTRAVENOUS

## 2018-10-07 MED ORDER — FENTANYL CITRATE (PF) 250 MCG/5ML IJ SOLN
INTRAMUSCULAR | Status: DC | PRN
  Administered 2018-10-07 (×2): 50 ug via INTRAVENOUS

## 2018-10-07 MED ORDER — DEXAMETHASONE SODIUM PHOSPHATE 10 MG/ML IJ SOLN
INTRAMUSCULAR | Status: DC | PRN
  Administered 2018-10-07: 4 mg via INTRAVENOUS

## 2018-10-07 MED ORDER — SUGAMMADEX SODIUM 200 MG/2ML IV SOLN
INTRAVENOUS | Status: DC | PRN
  Administered 2018-10-07: 420 mg via INTRAVENOUS

## 2018-10-07 MED ORDER — PROPOFOL 10 MG/ML IV BOLUS
INTRAVENOUS | Status: DC | PRN
  Administered 2018-10-07: 130 mg via INTRAVENOUS

## 2018-10-07 MED ORDER — ROCURONIUM BROMIDE 10 MG/ML (PF) SYRINGE
PREFILLED_SYRINGE | INTRAVENOUS | Status: DC | PRN
  Administered 2018-10-07: 40 mg via INTRAVENOUS

## 2018-10-07 MED ORDER — SUCCINYLCHOLINE CHLORIDE 200 MG/10ML IV SOSY
PREFILLED_SYRINGE | INTRAVENOUS | Status: AC
  Filled 2018-10-07: qty 10

## 2018-10-07 MED ORDER — 0.9 % SODIUM CHLORIDE (POUR BTL) OPTIME
TOPICAL | Status: DC | PRN
  Administered 2018-10-07: 1000 mL

## 2018-10-07 MED ORDER — CEFAZOLIN SODIUM-DEXTROSE 2-4 GM/100ML-% IV SOLN
2.0000 g | INTRAVENOUS | Status: AC
  Administered 2018-10-07: 2 g via INTRAVENOUS
  Filled 2018-10-07: qty 100

## 2018-10-07 MED ORDER — SODIUM CHLORIDE 0.9 % IV SOLN
INTRAVENOUS | Status: DC
  Administered 2018-10-07: 06:00:00 via INTRAVENOUS

## 2018-10-07 MED ORDER — GABAPENTIN 300 MG PO CAPS
300.0000 mg | ORAL_CAPSULE | ORAL | Status: AC
  Administered 2018-10-07: 300 mg via ORAL
  Filled 2018-10-07: qty 1

## 2018-10-07 SURGICAL SUPPLY — 44 items
ADAPTER TITANIUM MEDIONICS (MISCELLANEOUS) ×2 IMPLANT
ADH SKN CLS APL DERMABOND .7 (GAUZE/BANDAGES/DRESSINGS) ×1
ADPR DLYS CATH STRL LF DISP (MISCELLANEOUS) ×1
APL SKNCLS STERI-STRIP NONHPOA (GAUZE/BANDAGES/DRESSINGS) ×1
BENZOIN TINCTURE PRP APPL 2/3 (GAUZE/BANDAGES/DRESSINGS) ×2 IMPLANT
BLADE CLIPPER SURG (BLADE) IMPLANT
CATH EXTENDED DIALYSIS (CATHETERS) ×2 IMPLANT
CHLORAPREP W/TINT 26ML (MISCELLANEOUS) ×2 IMPLANT
COVER SURGICAL LIGHT HANDLE (MISCELLANEOUS) ×2 IMPLANT
COVER WAND RF STERILE (DRAPES) ×2 IMPLANT
DERMABOND ADVANCED (GAUZE/BANDAGES/DRESSINGS) ×1
DERMABOND ADVANCED .7 DNX12 (GAUZE/BANDAGES/DRESSINGS) IMPLANT
DISSECTOR BLUNT TIP ENDO 5MM (MISCELLANEOUS) IMPLANT
ELECT REM PT RETURN 9FT ADLT (ELECTROSURGICAL) ×2
ELECTRODE REM PT RTRN 9FT ADLT (ELECTROSURGICAL) ×1 IMPLANT
GAUZE SPONGE 2X2 8PLY STRL LF (GAUZE/BANDAGES/DRESSINGS) ×1 IMPLANT
GLOVE BIO SURGEON STRL SZ7.5 (GLOVE) ×2 IMPLANT
GOWN STRL REUS W/ TWL LRG LVL3 (GOWN DISPOSABLE) ×2 IMPLANT
GOWN STRL REUS W/ TWL XL LVL3 (GOWN DISPOSABLE) ×1 IMPLANT
GOWN STRL REUS W/TWL LRG LVL3 (GOWN DISPOSABLE) ×4
GOWN STRL REUS W/TWL XL LVL3 (GOWN DISPOSABLE) ×2
KIT BASIN OR (CUSTOM PROCEDURE TRAY) ×2 IMPLANT
NDL INSUFFLATION 14GA 120MM (NEEDLE) ×1 IMPLANT
NEEDLE INSUFFLATION 14GA 120MM (NEEDLE) ×2 IMPLANT
NS IRRIG 1000ML POUR BTL (IV SOLUTION) ×2 IMPLANT
PAD ARMBOARD 7.5X6 YLW CONV (MISCELLANEOUS) ×4 IMPLANT
SET EXT 12IN DIALYSIS STAY-SAF (MISCELLANEOUS) ×1 IMPLANT
SET IRRIG TUBING LAPAROSCOPIC (IRRIGATION / IRRIGATOR) IMPLANT
SET IRRIG Y TYPE TUR BLADDER L (SET/KITS/TRAYS/PACK) ×2 IMPLANT
SET TUBE SMOKE EVAC HIGH FLOW (TUBING) ×2 IMPLANT
SLEEVE ENDOPATH XCEL 5M (ENDOMECHANICALS) IMPLANT
SPONGE GAUZE 2X2 STER 10/PKG (GAUZE/BANDAGES/DRESSINGS) ×1
STYLET FALLER MEDIONICS (MISCELLANEOUS) ×2 IMPLANT
SUT MNCRL AB 4-0 PS2 18 (SUTURE) ×2 IMPLANT
SUT PROLENE 2 0 CT2 30 (SUTURE) IMPLANT
SUT SILK 2 0 (SUTURE) ×2
SUT SILK 2-0 18XBRD TIE 12 (SUTURE) IMPLANT
SUT SILK 3 0 SH 30 (SUTURE) IMPLANT
TOWEL OR 17X26 10 PK STRL BLUE (TOWEL DISPOSABLE) ×2 IMPLANT
TRAY LAPAROSCOPIC (CUSTOM PROCEDURE TRAY) ×2 IMPLANT
TROCAR 5M 150ML BLDLS (TROCAR) ×3 IMPLANT
TROCAR 5MMX150MM (TROCAR) ×1 IMPLANT
TROCAR XCEL NON-BLD 5MMX100MML (ENDOMECHANICALS) ×2 IMPLANT
WATER STERILE IRR 1000ML POUR (IV SOLUTION) ×2 IMPLANT

## 2018-10-07 NOTE — Anesthesia Procedure Notes (Signed)
Procedure Name: Intubation Date/Time: 10/07/2018 7:41 AM Performed by: Niel Hummer, CRNA Pre-anesthesia Checklist: Patient being monitored, Suction available, Emergency Drugs available and Patient identified Patient Re-evaluated:Patient Re-evaluated prior to induction Oxygen Delivery Method: Circle system utilized Preoxygenation: Pre-oxygenation with 100% oxygen Induction Type: IV induction and Rapid sequence Laryngoscope Size: Glidescope and 4 Grade View: Grade I Tube type: Oral Tube size: 7.5 mm Number of attempts: 1 Airway Equipment and Method: Stylet and Video-laryngoscopy Placement Confirmation: ETT inserted through vocal cords under direct vision,  positive ETCO2 and breath sounds checked- equal and bilateral Secured at: 22 cm Tube secured with: Tape Dental Injury: Teeth and Oropharynx as per pre-operative assessment

## 2018-10-07 NOTE — Anesthesia Postprocedure Evaluation (Signed)
Anesthesia Post Note  Patient: [name] [name]  Procedure(s) Performed: LAPAROSCOPIC PERITONEAL CATHETER PLACEMENT (N/A Abdomen)     Patient location during evaluation: PACU Anesthesia Type: General Level of consciousness: sedated and patient cooperative Pain management: pain level controlled Vital Signs Assessment: post-procedure vital signs reviewed and stable Respiratory status: spontaneous breathing Cardiovascular status: stable Anesthetic complications: no    Last Vitals:  Vitals:   10/07/18 0945 10/07/18 1000  BP: 130/77 130/89  Pulse: 67 71  Resp: 15 15  Temp: 36.6 C 36.6 C  SpO2: 94% 93%    Last Pain:  Vitals:   10/07/18 0945  TempSrc:   PainSc: [place]

## 2018-10-07 NOTE — Op Note (Signed)
Preoperative diagnosis: ESRD  Postoperative diagnosis: same   Procedure: laparoscopic peritoneal dialysis catheter insertion  Surgeon: Gurney Maxin, M.D.  Asst: none  Anesthesia: general  Indications for procedure: [name] [name] is a 67 y.o. year old male with ESRD who is interested in peritoneal dialysis. After discussing the patient history he was determined to be a good candidate.  Description of procedure: The patient was brought into the operative suite. Anesthesia was administered with General endotracheal anesthesia. WHO checklist was applied. The patient was then placed in supine position. The area was prepped and draped in the usual sterile fashion.  Next, a small transverse incision was made in the left subcostal area. A 21mm trocar was used to gain access to the peritoneal cavity by optical technique. Pneumoperitoneum was applied with high flow and low pressure. Laparoscope was reinserted to confirm placement. On initial evaluation of the abdomen, There were no adhesions seen. The omentum was small and not lying in the pelvis.  A location was chosen on the left side of the umbilicus and a transverse incision was made. A 158mm 40mm trocar was used to create a tunnel within the rectus sheath with the tip coming out in the lower abdomen. The catheter was put into the trocar and the trocar removed. The catheter was placed to the coil sat deep in the pelvis and the cuff was located within the tract. One additional 45mm trocar was placed in the left lateral abdomen and a grasper was used to ensure appropriate length on the intraabdominal coil.  Next, the catheter was connected to the second portion of catheter by titanium connector. 0 silk was used to secure the connector in place. The metal tunneler was then used to create a tract going to the upper abdomen and then back down to the lateral aspect of the abdomen. 1 additional incision was made in the upper abdomen for this tunnel. Care was  taken to keep the tunnel deep in the subcutaneous tissue. The catheter was pulled through such that the 2 white cuffs sat away from the incisions. The catheter was cut and the external titanium end was connected to the catheter end. The catheter was attached to tubing and the catheter filled and emptied easily. Pneumoperitoneum was then evacuated and the trocar removed. The periumbilical incision was closed in 2 layers with 3-0 vicryl for the deep layer and the interrupted 4-0 monocryl in subcuticular fashion for the skin. The other incisions were closed with interrupted 4-0 monocryl. Patient tolerated the procedure well and was brought to PACU in stable condition.  Findings: patent catheter with coiled tip in the pelvis  Specimen: none  Implant: PD catheter   Blood loss: <66ml  Local anesthesia: 30 ml marcaine   Complications: none  Gurney Maxin, M.D. General, Bariatric, & Minimally Invasive Surgery [facility]

## 2018-10-07 NOTE — H&P (Signed)
[name] [name] is an 68 y.o. male.   Chief Complaint: hemodialysis HPI: 68 yo male started on HD for CKD related to DM and HTN. He is interested in PD and wants a catheter placed.  Past Medical History:  Diagnosis Date  . Anemia   . Arthritis   . Asthma   . Bell palsy   . CAD (coronary artery disease)    a. 2014 MV: abnl w/ infap ischemia; b. 03/2013 Cath: aneurysmal bleb in the LAD w/ otw nonobs dzs-->Med Rx.  . Chronic back pain   . Chronic knee pain    a. 09/2015 s/p R TKA.  Marland Kitchen Chronic pain   . Chronic shoulder pain   . Chronic sinusitis   . CKD (chronic kidney disease), stage IV ([place])    stage 4 per office visit note of Dr Lowanda Foster on 05/2015   . COPD (chronic obstructive pulmonary disease) ([place])   . Diabetes mellitus without complication ([place])    type II   . Essential hypertension   . GERD (gastroesophageal reflux disease)   . Gout   . Gout   . Hepatomegaly    noted on noncontrast CT 2015  . History of hiatal hernia   . Hyperlipidemia   . Lateral meniscus tear   . Obesity    Truncal  . Obstructive sleep apnea    does not use cpap   . On home oxygen therapy    uses 2l when is going somewhere per patient   . PUD (peptic ulcer disease)    remote, reports f/u EGD about 8 years ago unremarkable   . Reactive airway disease    related to exposure to chemical during 9/11  . Renal insufficiency   . Sinusitis   . Vitamin D deficiency     Past Surgical History:  Procedure Laterality Date  . ASAD LT SHOULDER  12/2008   left shoulder  . AV FISTULA PLACEMENT Left 08/09/2016   Procedure: BRACHIOCEPHALIC ARTERIOVENOUS (AV) FISTULA CREATION LEFT ARM;  Surgeon: [name], MD;  Location: [facility];  Service: Vascular;  Laterality: Left;  . CATARACT EXTRACTION W/PHACO Left 03/28/2016   Procedure: CATARACT EXTRACTION PHACO AND INTRAOCULAR LENS PLACEMENT LEFT EYE;  Surgeon: [name], MD;  [facility];  Service: Ophthalmology;  Laterality: Left;  CDE: 4.77  . CATARACT  EXTRACTION W/PHACO Right 04/11/2016   Procedure: CATARACT EXTRACTION PHACO AND INTRAOCULAR LENS PLACEMENT RIGHT EYE; CDE:  4.74;  Surgeon: [name], MD;  Location: [facility];  Service: Ophthalmology;  Laterality: Right;  . COLONOSCOPY  10/2008   Fields: Rectal polyp obliterated, not retrieved, hemorrhoids, single ascending colon diverticulum near the CV. Next colonoscopy April 2020  . COLONOSCOPY N/A 12/25/2014   SLF: 1. Colorectal polyps (2) removed 2. Small internal hemorrhoids 3. the left colon is severely redundant  . DOPPLER ECHOCARDIOGRAPHY    . ESOPHAGOGASTRODUODENOSCOPY N/A 12/25/2014   SLF: 1. Anemia most likely due to CRI, gastritis, gastric polyps 2. Moderate non-erosive gastriits and mild duodenitis.  3.TWo large gstric polyps removed.   Marland Kitchen EYE SURGERY  12/22/2010   tear duct probing-[place]  . FOREIGN BODY REMOVAL  03/29/2011   Procedure: REMOVAL FOREIGN BODY EXTREMITY;  Surgeon: Arther Abbott, MD;  Location: [facility];  Service: Orthopedics;  Laterality: Right;  Removal Foreign Body Right Thumb  . IR FLUORO GUIDE CV LINE RIGHT  08/06/2018  . IR US GUIDE VASC ACCESS RIGHT  08/06/2018  . KNEE ARTHROSCOPY  10/2007   left  . KNEE  ARTHROSCOPY WITH LATERAL MENISECTOMY Right 10/14/2015   Procedure: LEFT KNEE ARTHROSCOPY WITH PARTIAL LATERAL MENISECTOMY;  Surgeon: Carole Civil, MD;  [facility];  Service: Orthopedics;  Laterality: Right;  . LEFT HEART CATHETERIZATION WITH CORONARY ANGIOGRAM N/A 03/28/2013   Procedure: LEFT HEART CATHETERIZATION WITH CORONARY ANGIOGRAM;  Surgeon: Leonie Man, MD;  Location: [facility] CATH LAB;  Service: Cardiovascular;  Laterality: N/A;  . NM MYOVIEW LTD    . PENILE PROSTHESIS IMPLANT N/A 08/16/2015   Procedure: PENILE PROTHESIS INFLATABLE, three piece, Excisional biopsy of Penile ulcer, Penile molding;  Surgeon: Carolan Clines, MD;  [facility];  Service: Urology;  Laterality: N/A;  . PENILE PROSTHESIS IMPLANT N/A 12/24/2017   Procedure: REMOVAL  AND  REPLACEMENT  COLOPLAST PENILE PROSTHESIS;  Surgeon: Lucas Mallow, MD;  [facility];  Service: Urology;  Laterality: N/A;  . QUADRICEPS TENDON REPAIR  07/21/2011   Procedure: REPAIR QUADRICEP TENDON;  Surgeon: Arther Abbott, MD;  [facility];  Service: Orthopedics;  Laterality: Right;  . TOENAIL EXCISION     removed [id number]  . UMBILICAL HERNIA REPAIR  2007   [place]    Family History  Problem Relation Age of Onset  . Hypertension Mother        MI  . Cancer Mother        breast   . Diabetes Mother   . Diabetes Father   . Hypertension Father   . Hypertension Sister   . Diabetes Sister   . Arthritis Other   . Asthma Other   . Lung disease Other   . Anesthesia problems Neg Hx   . Hypotension Neg Hx   . Malignant hyperthermia Neg Hx   . Pseudochol deficiency Neg Hx   . Colon cancer Neg Hx    Social History:  reports that he quit smoking about 8 years ago. His smoking use included cigarettes. He has a 25.00 pack-year smoking history. He has never used smokeless tobacco. He reports current alcohol use. He reports current drug use. Drug: Marijuana.  Allergies:  Allergies  Allergen Reactions  . Tramadol Hcl   . Opana [Oxymorphone Hcl] Itching  . Oxymorphone Itching  . Tramadol Itching    Medications Prior to Admission  Medication Sig Dispense Refill  . allopurinol (ZYLOPRIM) 100 MG tablet Take 200 mg by mouth daily.     Marland Kitchen amLODipine (NORVASC) 10 MG tablet Take 10 mg by mouth daily.    Marland Kitchen aspirin EC 81 MG tablet Take 1 tablet (81 mg total) by mouth daily. 90 tablet 3  . budesonide-formoterol (SYMBICORT) 160-4.5 MCG/ACT inhaler Inhale 2 puffs into the lungs 2 (two) times daily as needed (Shortness of Breath).     . calcitRIOL (ROCALTROL) 0.25 MCG capsule Take 0.25 mcg by mouth daily.    . cetirizine (ZYRTEC) 10 MG tablet Take 10 mg by mouth at bedtime as needed for allergies.     . folic acid (FOLVITE) 1 MG tablet Take 1 tablet by mouth daily.    .  hydrALAZINE (APRESOLINE) 25 MG tablet Take 25 mg by mouth 3 (three) times daily.    . Insulin Glargine (LANTUS SOLOSTAR) 100 UNIT/ML Solostar Pen INJECT 20 UNITS SUBCUTANEOUSLY AT BEDTIME. 45 mL 0  . linagliptin (TRADJENTA) 5 MG TABS tablet Take 1 tablet (5 mg total) by mouth daily. 90 tablet 0  . mometasone (NASONEX) 50 MCG/ACT nasal spray Place 2 sprays into the nose 2 (two) times daily as needed (allergies).     Marland Kitchen  pantoprazole (PROTONIX) 40 MG tablet TAKE 1 TABLET TWICE DAILY 30 MINUTES PRIOR TO MEALS (Patient taking differently: Take 40 mg by mouth 2 (two) times daily before a meal. ) 180 tablet 3  . simvastatin (ZOCOR) 20 MG tablet Take 20 mg by mouth every morning.     . sodium chloride (MURO 128) 5 % ophthalmic ointment Place 1 application into both eyes at bedtime.    . Insulin Pen Needle (B-D UF III MINI PEN NEEDLES) 31G X 5 MM MISC INJECT SUBCUTANEOUSLY TWICE DAILY AS DIRECTED 180 each 2    Results for orders placed or performed during the hospital encounter of 10/07/18 (from the past 48 hour(s))  Glucose, capillary     Status: Abnormal   Collection Time: 10/07/18  5:38 AM  Result Value Ref Range   Glucose-Capillary 154 (H) 70 - 99 mg/dL   Comment 1 Notify RN    Comment 2 Document in Chart   Basic metabolic panel     Status: Abnormal   Collection Time: 10/07/18  6:00 AM  Result Value Ref Range   Sodium 138 135 - 145 mmol/L   Potassium 3.7 3.5 - 5.1 mmol/L   Chloride 104 98 - 111 mmol/L   CO2 21 (L) 22 - 32 mmol/L   Glucose, Bld 137 (H) 70 - 99 mg/dL   BUN 42 (H) 8 - 23 mg/dL   Creatinine, Ser 7.29 (H) 0.61 - 1.24 mg/dL   Calcium 8.8 (L) 8.9 - 10.3 mg/dL   GFR calc non Af Amer 7 (L) >60 mL/min   GFR calc Af Amer 8 (L) >60 mL/min   Anion gap 13 5 - 15    Comment: Performed at [facility], [place] [address] [place], [place] [address]  CBC WITH DIFFERENTIAL     Status: Abnormal   Collection Time: 10/07/18  6:00 AM  Result Value Ref Range   WBC 4.8 4.0 - 10.5  K/uL   RBC 3.96 (L) 4.22 - 5.81 MIL/uL   Hemoglobin 10.2 (L) 13.0 - 17.0 g/dL   HCT 35.7 (L) 39.0 - 52.0 %   MCV 90.2 80.0 - 100.0 fL   MCH 25.8 (L) 26.0 - 34.0 pg   MCHC 28.6 (L) 30.0 - 36.0 g/dL   RDW 17.9 (H) 11.5 - 15.5 %   Platelets 193 150 - 400 K/uL   nRBC 0.0 0.0 - 0.2 %   Neutrophils Relative % 64 %   Neutro Abs 3.1 1.7 - 7.7 K/uL   Lymphocytes Relative 17 %   Lymphs Abs 0.8 0.7 - 4.0 K/uL   Monocytes Relative 11 %   Monocytes Absolute 0.5 0.1 - 1.0 K/uL   Eosinophils Relative 7 %   Eosinophils Absolute 0.3 0.0 - 0.5 K/uL   Basophils Relative 1 %   Basophils Absolute 0.0 0.0 - 0.1 K/uL   Immature Granulocytes 0 %   Abs Immature Granulocytes 0.01 0.00 - 0.07 K/uL    Comment: Performed at [facility], [place] [address] [place], [place] [address]   No results found.  Review of Systems  Constitutional: Negative for chills and fever.  HENT: Negative for hearing loss.   Eyes: Negative for blurred vision and double vision.  Respiratory: Negative for cough and hemoptysis.   Cardiovascular: Negative for chest pain and palpitations.  Gastrointestinal: Negative for abdominal pain, nausea and vomiting.  Genitourinary: Negative for dysuria and urgency.  Musculoskeletal: Negative for myalgias and neck pain.  Skin: Negative for itching and rash.  Neurological: Negative for dizziness, tingling and headaches.  Endo/Heme/Allergies: Does not bruise/bleed easily.  Psychiatric/Behavioral: Negative for depression and suicidal ideas.    Blood pressure (!) 159/89, pulse 89, temperature 98.4 F (36.9 C), temperature source Oral, resp. rate 18, height 5\' 4"  (1.626 m), weight 105 kg, SpO2 100 %. Physical Exam  Vitals reviewed. Constitutional: He is oriented to person, place, and time. He appears well-developed and well-nourished.  HENT:  Head: Normocephalic and atraumatic.  Eyes: Pupils are equal, round, and reactive to light. Conjunctivae and EOM are normal.  Neck:  Normal range of motion. Neck supple.  Cardiovascular: Normal rate and regular rhythm.  Respiratory: Effort normal and breath sounds normal.  GI: Soft. Bowel sounds are normal. He exhibits no distension. There is no abdominal tenderness.  Musculoskeletal: Normal range of motion.  Neurological: He is alert and oriented to person, place, and time.  Skin: Skin is warm and dry.  Psychiatric: He has a normal mood and affect. His behavior is normal.     Assessment/Plan 68 yo male with ESRD -lap PD cath placement  Mickeal Skinner, MD 10/07/2018, 6:55 AM

## 2018-10-07 NOTE — Transfer of Care (Signed)
Immediate Anesthesia Transfer of Care Note  Patient: [name] [name]  Procedure(s) Performed: LAPAROSCOPIC PERITONEAL CATHETER PLACEMENT (N/A Abdomen)  Patient Location: PACU  Anesthesia Type:General  Level of Consciousness: awake, alert  and oriented  Airway & Oxygen Therapy: Patient Spontanous Breathing and Patient connected to face mask oxygen  Post-op Assessment: Report given to RN and Post -op Vital signs reviewed and stable  Post vital signs: Reviewed and stable  Last Vitals:  Vitals Value Taken Time  BP 141/95 10/07/2018  8:48 AM  Temp    Pulse 77 10/07/2018  8:50 AM  Resp 17 10/07/2018  8:50 AM  SpO2 100 % 10/07/2018  8:50 AM  Vitals shown include unvalidated device data.  Last Pain:  Vitals:   10/07/18 0530  TempSrc: Oral         Complications: No apparent anesthesia complications

## 2018-10-08 DIAGNOSIS — N186 End stage renal disease: Secondary | ICD-10-CM | POA: Diagnosis not present

## 2018-10-08 DIAGNOSIS — D631 Anemia in chronic kidney disease: Secondary | ICD-10-CM | POA: Diagnosis not present

## 2018-10-08 DIAGNOSIS — D509 Iron deficiency anemia, unspecified: Secondary | ICD-10-CM | POA: Diagnosis not present

## 2018-10-08 DIAGNOSIS — Z992 Dependence on renal dialysis: Secondary | ICD-10-CM | POA: Diagnosis not present

## 2018-10-10 ENCOUNTER — Encounter (HOSPITAL_COMMUNITY): Payer: Self-pay | Admitting: General Surgery

## 2018-10-10 DIAGNOSIS — Z992 Dependence on renal dialysis: Secondary | ICD-10-CM | POA: Diagnosis not present

## 2018-10-10 DIAGNOSIS — D631 Anemia in chronic kidney disease: Secondary | ICD-10-CM | POA: Diagnosis not present

## 2018-10-10 DIAGNOSIS — N186 End stage renal disease: Secondary | ICD-10-CM | POA: Diagnosis not present

## 2018-10-10 DIAGNOSIS — D509 Iron deficiency anemia, unspecified: Secondary | ICD-10-CM | POA: Diagnosis not present

## 2018-10-12 DIAGNOSIS — D509 Iron deficiency anemia, unspecified: Secondary | ICD-10-CM | POA: Diagnosis not present

## 2018-10-12 DIAGNOSIS — Z992 Dependence on renal dialysis: Secondary | ICD-10-CM | POA: Diagnosis not present

## 2018-10-12 DIAGNOSIS — D631 Anemia in chronic kidney disease: Secondary | ICD-10-CM | POA: Diagnosis not present

## 2018-10-12 DIAGNOSIS — N186 End stage renal disease: Secondary | ICD-10-CM | POA: Diagnosis not present

## 2018-10-15 DIAGNOSIS — Z992 Dependence on renal dialysis: Secondary | ICD-10-CM | POA: Diagnosis not present

## 2018-10-15 DIAGNOSIS — N186 End stage renal disease: Secondary | ICD-10-CM | POA: Diagnosis not present

## 2018-10-15 DIAGNOSIS — D509 Iron deficiency anemia, unspecified: Secondary | ICD-10-CM | POA: Diagnosis not present

## 2018-10-15 DIAGNOSIS — D631 Anemia in chronic kidney disease: Secondary | ICD-10-CM | POA: Diagnosis not present

## 2018-10-17 DIAGNOSIS — D509 Iron deficiency anemia, unspecified: Secondary | ICD-10-CM | POA: Diagnosis not present

## 2018-10-17 DIAGNOSIS — N186 End stage renal disease: Secondary | ICD-10-CM | POA: Diagnosis not present

## 2018-10-17 DIAGNOSIS — Z992 Dependence on renal dialysis: Secondary | ICD-10-CM | POA: Diagnosis not present

## 2018-10-17 DIAGNOSIS — D631 Anemia in chronic kidney disease: Secondary | ICD-10-CM | POA: Diagnosis not present

## 2018-10-19 DIAGNOSIS — D509 Iron deficiency anemia, unspecified: Secondary | ICD-10-CM | POA: Diagnosis not present

## 2018-10-19 DIAGNOSIS — Z992 Dependence on renal dialysis: Secondary | ICD-10-CM | POA: Diagnosis not present

## 2018-10-19 DIAGNOSIS — N186 End stage renal disease: Secondary | ICD-10-CM | POA: Diagnosis not present

## 2018-10-19 DIAGNOSIS — D631 Anemia in chronic kidney disease: Secondary | ICD-10-CM | POA: Diagnosis not present

## 2018-10-22 DIAGNOSIS — N186 End stage renal disease: Secondary | ICD-10-CM | POA: Diagnosis not present

## 2018-10-22 DIAGNOSIS — Z992 Dependence on renal dialysis: Secondary | ICD-10-CM | POA: Diagnosis not present

## 2018-10-22 DIAGNOSIS — D509 Iron deficiency anemia, unspecified: Secondary | ICD-10-CM | POA: Diagnosis not present

## 2018-10-22 DIAGNOSIS — D631 Anemia in chronic kidney disease: Secondary | ICD-10-CM | POA: Diagnosis not present

## 2018-10-23 ENCOUNTER — Other Ambulatory Visit: Payer: Self-pay

## 2018-10-23 ENCOUNTER — Ambulatory Visit (INDEPENDENT_AMBULATORY_CARE_PROVIDER_SITE_OTHER): Payer: Medicare Other | Admitting: Gastroenterology

## 2018-10-23 DIAGNOSIS — K219 Gastro-esophageal reflux disease without esophagitis: Secondary | ICD-10-CM

## 2018-10-23 DIAGNOSIS — K5901 Slow transit constipation: Secondary | ICD-10-CM

## 2018-10-23 NOTE — Patient Instructions (Addendum)
FOLLOW A HIGH FIBER DIET. AVOID ITEMS THAT CAUSE BLOATING & GAS.  USE SENNA LAXATVE SWISS KRISS, OR COLACE WITH A STIMULANT LAXATIVE 1 OR 2 TIMES A DAY TO HAVE A BM.  CONTINUE PROTONIX. TAKE 30 MINUTES PRIOR TO MEALS ONCE OR TWICE DAILY.  FOLLOW UP IN 6 MOS. PLEASE CALL WITH QUESTIONS OR CONCERNS.

## 2018-10-23 NOTE — Assessment & Plan Note (Signed)
SYMPTOMS FAIRLY WELL CONTROLLED.  CONTINUE PROTONIX. TAKE 30 MINUTES PRIOR TO MEALS ONCE OR TWICE DAILY. FOLLOW UP IN 6 MOS. PLEASE CALL WITH QUESTIONS OR CONCERNS.

## 2018-10-23 NOTE — Progress Notes (Signed)
Subjective:    Patient ID: [name] [name], male    DOB: July 03, 1951, 68 y.o.   MRN: [id number] [name], MD  HPI NO QUESTIONS OR CONCERNS. TAKING PROTONIX ONCE A DAY AND SYMPTOMS ARE CONTROLLED.  HAD SURGERY A COUPLE WEEKS AGO. BMs: EVERY DAY BUT DOESN'T FEEL LIKE HE EMPTIES AND NOT  GOOD MOVEMENT. WAS DURING SURGERY AND NOT NOW. CONSTIPATION NOT THE BEST BEFORE SURGERY AND WORSE WITH PAIN PILLS. STARTED DRINKING PRUNE JUICE NOT IDEALLY HELPING. DIDN'T TRY HERBAL TEA OR TRIED LINZESS SO SO. RESTORING A '69 GTO. RESTRICTED ON WATER DUE TO KIDNEY DISEASE. EATING FIBER.    PT DENIES FEVER, CHILLS, HEMATOCHEZIA, HEMATEMESIS, nausea, vomiting, melena, diarrhea, CHEST PAIN, SHORTNESS OF BREATH, CHANGE IN BOWEL IN HABITS, abdominal pain, problems swallowing, OR heartburn or indigestion.  Past Medical History:  Diagnosis Date  . Anemia   . Arthritis   . Asthma   . Bell palsy   . CAD (coronary artery disease)    a. 2014 MV: abnl w/ infap ischemia; b. 03/2013 Cath: aneurysmal bleb in the LAD w/ otw nonobs dzs-->Med Rx.  . Chronic back pain   . Chronic knee pain    a. 09/2015 s/p R TKA.  Marland Kitchen Chronic pain   . Chronic shoulder pain   . Chronic sinusitis   . CKD (chronic kidney disease), stage IV ([place])    stage 4 per office visit note of Dr Lowanda Foster on 05/2015   . COPD (chronic obstructive pulmonary disease) ([place])   . Diabetes mellitus without complication ([place])    type II   . Essential hypertension   . GERD (gastroesophageal reflux disease)   . Gout   . Gout   . Hepatomegaly    noted on noncontrast CT 2015  . History of hiatal hernia   . Hyperlipidemia   . Lateral meniscus tear   . Obesity    Truncal  . Obstructive sleep apnea    does not use cpap   . On home oxygen therapy    uses 2l when is going somewhere per patient   . PUD (peptic ulcer disease)    remote, reports f/u EGD about 8 years ago unremarkable   . Reactive airway disease    related to exposure to chemical during  9/11  . Renal insufficiency   . Sinusitis   . Vitamin D deficiency    Past Surgical History:  Procedure Laterality Date  . ASAD LT SHOULDER  12/2008   left shoulder  . AV FISTULA PLACEMENT Left 08/09/2016   Procedure: BRACHIOCEPHALIC ARTERIOVENOUS (AV) FISTULA CREATION LEFT ARM;  Surgeon: [name], MD;  Location: [place] [facility];  Service: Vascular;  Laterality: Left;  . CAPD INSERTION N/A 10/07/2018   Procedure: LAPAROSCOPIC PERITONEAL CATHETER PLACEMENT;  Surgeon: Kieth Brightly Arta Bruce, MD;  [facility];  Service: General;  Laterality: N/A;  . CATARACT EXTRACTION W/PHACO Left 03/28/2016   Procedure: CATARACT EXTRACTION PHACO AND INTRAOCULAR LENS PLACEMENT LEFT EYE;  Surgeon: [name], MD;  Location: [facility];  Service: Ophthalmology;  Laterality: Left;  CDE: 4.77  . CATARACT EXTRACTION W/PHACO Right 04/11/2016   Procedure: CATARACT EXTRACTION PHACO AND INTRAOCULAR LENS PLACEMENT RIGHT EYE; CDE:  4.74;  Surgeon: [name], MD;  [facility];  Service: Ophthalmology;  Laterality: Right;  . COLONOSCOPY  10/2008   Jourdan Maldonado: Rectal polyp obliterated, not retrieved, hemorrhoids, single ascending colon diverticulum near the CV. Next colonoscopy April 2020  . COLONOSCOPY N/A 12/25/2014   SLF: 1. Colorectal polyps (  2) removed 2. Small internal hemorrhoids 3. the left colon is severely redundant  . DOPPLER ECHOCARDIOGRAPHY    . ESOPHAGOGASTRODUODENOSCOPY N/A 12/25/2014   SLF: 1. Anemia most likely due to CRI, gastritis, gastric polyps 2. Moderate non-erosive gastriits and mild duodenitis.  3.TWo large gstric polyps removed.   Marland Kitchen EYE SURGERY  12/22/2010   tear duct probing-[place]  . FOREIGN BODY REMOVAL  03/29/2011   Procedure: REMOVAL FOREIGN BODY EXTREMITY;  Surgeon: Arther Abbott, MD;  [facility];  Service: Orthopedics;  Laterality: Right;  Removal Foreign Body Right Thumb  . IR FLUORO GUIDE CV LINE RIGHT  08/06/2018  . IR US GUIDE VASC ACCESS RIGHT  08/06/2018  . KNEE ARTHROSCOPY   10/2007   left  . KNEE ARTHROSCOPY WITH LATERAL MENISECTOMY Right 10/14/2015   Procedure: LEFT KNEE ARTHROSCOPY WITH PARTIAL LATERAL MENISECTOMY;  Surgeon: Carole Civil, MD;  [facility];  Service: Orthopedics;  Laterality: Right;  . LEFT HEART CATHETERIZATION WITH CORONARY ANGIOGRAM N/A 03/28/2013   Procedure: LEFT HEART CATHETERIZATION WITH CORONARY ANGIOGRAM;  Surgeon: Leonie Man, MD;  Location: [facility] CATH LAB;  Service: Cardiovascular;  Laterality: N/A;  . NM MYOVIEW LTD    . PENILE PROSTHESIS IMPLANT N/A 08/16/2015   Procedure: PENILE PROTHESIS INFLATABLE, three piece, Excisional biopsy of Penile ulcer, Penile molding;  Surgeon: Carolan Clines, MD;  [facility];  Service: Urology;  Laterality: N/A;  . PENILE PROSTHESIS IMPLANT N/A 12/24/2017   Procedure: REMOVAL AND  REPLACEMENT  COLOPLAST PENILE PROSTHESIS;  Surgeon: Lucas Mallow, MD;  [facility];  Service: Urology;  Laterality: N/A;  . QUADRICEPS TENDON REPAIR  07/21/2011   Procedure: REPAIR QUADRICEP TENDON;  Surgeon: Arther Abbott, MD;  [facility];  Service: Orthopedics;  Laterality: Right;  . TOENAIL EXCISION     removed [id number]  . UMBILICAL HERNIA REPAIR  2007   [place]    Allergies  Allergen Reactions  . Tramadol Hcl   . Opana [Oxymorphone Hcl] Itching  . Oxymorphone Itching  . Tramadol Itching   Current Outpatient Medications  Medication Sig    . allopurinol (ZYLOPRIM) 100 MG tablet Take 200 mg by mouth daily.     Marland Kitchen amLODipine (NORVASC) 10 MG tablet Take 10 mg by mouth daily.    Marland Kitchen aspirin EC 81 MG tablet Take 1 tablet (81 mg total) by mouth daily.    . budesonide-formoterol (SYMBICORT) 160-4.5 MCG/ACT inhaler Inhale 2 puffs into the lungs 2 (two) times daily as needed (Shortness of Breath).     . calcitRIOL (ROCALTROL) 0.25 MCG capsule Take 0.25 mcg by mouth daily.    . folic acid (FOLVITE) 1 MG tablet Take 1 tablet by mouth daily.    . hydrALAZINE (APRESOLINE) 25 MG tablet Take  25 mg by mouth 3 (three) times daily.    . Insulin Glargine (LANTUS SOLOSTAR) 100 UNIT/ML Solostar Pen INJECT 20 UNITS SUBCUTANEOUSLY AT BEDTIME.    . Insulin Pen Needle (B-D UF III MINI PEN NEEDLES) 31G X 5 MM MISC INJECT SUBCUTANEOUSLY TWICE DAILY AS DIRECTED    . linagliptin (TRADJENTA) 5 MG TABS tablet Take 1 tablet (5 mg total) by mouth daily.    . mometasone (NASONEX) 50 MCG/ACT nasal spray Place 2 sprays into the nose 2 (two) times daily as needed (allergies).     . pantoprazole (PROTONIX) 40 MG tablet Take 40 mg by mouth 2 (two) times daily before a meal.)    . simvastatin (ZOCOR) 20 MG tablet Take 20  mg by mouth every morning.     . cetirizine (ZYRTEC) 10 MG tablet Take 10 mg by mouth at bedtime as needed for allergies.     .      . sodium chloride (MURO 128) 5 % ophthalmic ointment Place 1 application into both eyes at bedtime.     Review of Systems PER HPI OTHERWISE ALL SYSTEMS ARE NEGATIVE.    Objective:   Physical Exam   FACETIME VISIT PERFORMED DURING COVID 19 CRISIS.    Assessment & Plan:

## 2018-10-23 NOTE — Assessment & Plan Note (Signed)
SYMPTOMS NOT IDEALLY CONTROLLED. CAN'T DRINK WATER DUE TO KIDNEY DISEASE.  FOLLOW A HIGH FIBER DIET. AVOID ITEMS THAT CAUSE BLOATING & GAS. USE SENNA LAXATVE SWISS KRISS, OR COLACE WITH A STIMULANT LAXATIVE 1 OR 2 TIMES A DAY TO HAVE A BM. FOLLOW UP IN 6 MOS. PLEASE CALL WITH QUESTIONS OR CONCERNS.

## 2018-10-24 ENCOUNTER — Ambulatory Visit: Payer: Medicare Other | Admitting: Gastroenterology

## 2018-10-24 DIAGNOSIS — Z992 Dependence on renal dialysis: Secondary | ICD-10-CM | POA: Diagnosis not present

## 2018-10-24 DIAGNOSIS — D631 Anemia in chronic kidney disease: Secondary | ICD-10-CM | POA: Diagnosis not present

## 2018-10-24 DIAGNOSIS — N186 End stage renal disease: Secondary | ICD-10-CM | POA: Diagnosis not present

## 2018-10-24 DIAGNOSIS — D509 Iron deficiency anemia, unspecified: Secondary | ICD-10-CM | POA: Diagnosis not present

## 2018-10-24 NOTE — Progress Notes (Signed)
cc'ed to pcp °

## 2018-10-26 DIAGNOSIS — Z992 Dependence on renal dialysis: Secondary | ICD-10-CM | POA: Diagnosis not present

## 2018-10-26 DIAGNOSIS — D631 Anemia in chronic kidney disease: Secondary | ICD-10-CM | POA: Diagnosis not present

## 2018-10-26 DIAGNOSIS — N186 End stage renal disease: Secondary | ICD-10-CM | POA: Diagnosis not present

## 2018-10-26 DIAGNOSIS — D509 Iron deficiency anemia, unspecified: Secondary | ICD-10-CM | POA: Diagnosis not present

## 2018-10-28 DIAGNOSIS — Z992 Dependence on renal dialysis: Secondary | ICD-10-CM | POA: Diagnosis not present

## 2018-10-28 DIAGNOSIS — N186 End stage renal disease: Secondary | ICD-10-CM | POA: Diagnosis not present

## 2018-10-29 DIAGNOSIS — N186 End stage renal disease: Secondary | ICD-10-CM | POA: Diagnosis not present

## 2018-10-29 DIAGNOSIS — Z992 Dependence on renal dialysis: Secondary | ICD-10-CM | POA: Diagnosis not present

## 2018-10-30 DIAGNOSIS — Z992 Dependence on renal dialysis: Secondary | ICD-10-CM | POA: Diagnosis not present

## 2018-10-30 DIAGNOSIS — N186 End stage renal disease: Secondary | ICD-10-CM | POA: Diagnosis not present

## 2018-10-31 DIAGNOSIS — Z992 Dependence on renal dialysis: Secondary | ICD-10-CM | POA: Diagnosis not present

## 2018-10-31 DIAGNOSIS — N186 End stage renal disease: Secondary | ICD-10-CM | POA: Diagnosis not present

## 2018-11-01 DIAGNOSIS — Z992 Dependence on renal dialysis: Secondary | ICD-10-CM | POA: Diagnosis not present

## 2018-11-01 DIAGNOSIS — N186 End stage renal disease: Secondary | ICD-10-CM | POA: Diagnosis not present

## 2018-11-02 DIAGNOSIS — Z992 Dependence on renal dialysis: Secondary | ICD-10-CM | POA: Diagnosis not present

## 2018-11-02 DIAGNOSIS — N186 End stage renal disease: Secondary | ICD-10-CM | POA: Diagnosis not present

## 2018-11-05 DIAGNOSIS — N186 End stage renal disease: Secondary | ICD-10-CM | POA: Diagnosis not present

## 2018-11-05 DIAGNOSIS — M79672 Pain in left foot: Secondary | ICD-10-CM | POA: Diagnosis not present

## 2018-11-05 DIAGNOSIS — Z992 Dependence on renal dialysis: Secondary | ICD-10-CM | POA: Diagnosis not present

## 2018-11-05 DIAGNOSIS — D509 Iron deficiency anemia, unspecified: Secondary | ICD-10-CM | POA: Diagnosis not present

## 2018-11-05 DIAGNOSIS — N2581 Secondary hyperparathyroidism of renal origin: Secondary | ICD-10-CM | POA: Diagnosis not present

## 2018-11-05 DIAGNOSIS — M10072 Idiopathic gout, left ankle and foot: Secondary | ICD-10-CM | POA: Diagnosis not present

## 2018-11-06 DIAGNOSIS — N2581 Secondary hyperparathyroidism of renal origin: Secondary | ICD-10-CM | POA: Diagnosis not present

## 2018-11-06 DIAGNOSIS — N186 End stage renal disease: Secondary | ICD-10-CM | POA: Diagnosis not present

## 2018-11-06 DIAGNOSIS — Z992 Dependence on renal dialysis: Secondary | ICD-10-CM | POA: Diagnosis not present

## 2018-11-06 DIAGNOSIS — D509 Iron deficiency anemia, unspecified: Secondary | ICD-10-CM | POA: Diagnosis not present

## 2018-11-07 DIAGNOSIS — N2581 Secondary hyperparathyroidism of renal origin: Secondary | ICD-10-CM | POA: Diagnosis not present

## 2018-11-07 DIAGNOSIS — Z79899 Other long term (current) drug therapy: Secondary | ICD-10-CM | POA: Diagnosis not present

## 2018-11-07 DIAGNOSIS — E119 Type 2 diabetes mellitus without complications: Secondary | ICD-10-CM | POA: Diagnosis not present

## 2018-11-07 DIAGNOSIS — Z794 Long term (current) use of insulin: Secondary | ICD-10-CM | POA: Diagnosis not present

## 2018-11-07 DIAGNOSIS — D509 Iron deficiency anemia, unspecified: Secondary | ICD-10-CM | POA: Diagnosis not present

## 2018-11-07 DIAGNOSIS — N186 End stage renal disease: Secondary | ICD-10-CM | POA: Diagnosis not present

## 2018-11-07 DIAGNOSIS — Z992 Dependence on renal dialysis: Secondary | ICD-10-CM | POA: Diagnosis not present

## 2018-11-08 DIAGNOSIS — Z992 Dependence on renal dialysis: Secondary | ICD-10-CM | POA: Diagnosis not present

## 2018-11-08 DIAGNOSIS — N186 End stage renal disease: Secondary | ICD-10-CM | POA: Diagnosis not present

## 2018-11-09 DIAGNOSIS — Z992 Dependence on renal dialysis: Secondary | ICD-10-CM | POA: Diagnosis not present

## 2018-11-09 DIAGNOSIS — N186 End stage renal disease: Secondary | ICD-10-CM | POA: Diagnosis not present

## 2018-11-10 DIAGNOSIS — N186 End stage renal disease: Secondary | ICD-10-CM | POA: Diagnosis not present

## 2018-11-10 DIAGNOSIS — Z992 Dependence on renal dialysis: Secondary | ICD-10-CM | POA: Diagnosis not present

## 2018-11-11 ENCOUNTER — Telehealth: Payer: Self-pay

## 2018-11-11 DIAGNOSIS — N186 End stage renal disease: Secondary | ICD-10-CM | POA: Diagnosis not present

## 2018-11-11 DIAGNOSIS — Z992 Dependence on renal dialysis: Secondary | ICD-10-CM | POA: Diagnosis not present

## 2018-11-11 NOTE — Telephone Encounter (Signed)

## 2018-11-12 ENCOUNTER — Telehealth: Payer: Self-pay | Admitting: Internal Medicine

## 2018-11-12 DIAGNOSIS — N186 End stage renal disease: Secondary | ICD-10-CM | POA: Diagnosis not present

## 2018-11-12 DIAGNOSIS — Z992 Dependence on renal dialysis: Secondary | ICD-10-CM | POA: Diagnosis not present

## 2018-11-12 NOTE — Telephone Encounter (Signed)
Smartphone/ declined my chart/ consent/ pre reg completed

## 2018-11-13 ENCOUNTER — Encounter: Payer: Self-pay | Admitting: Internal Medicine

## 2018-11-13 ENCOUNTER — Telehealth (INDEPENDENT_AMBULATORY_CARE_PROVIDER_SITE_OTHER): Payer: Medicare Other | Admitting: Internal Medicine

## 2018-11-13 VITALS — BP 159/87 | Ht 64.0 in | Wt 222.0 lb

## 2018-11-13 DIAGNOSIS — I10 Essential (primary) hypertension: Secondary | ICD-10-CM | POA: Diagnosis not present

## 2018-11-13 DIAGNOSIS — N186 End stage renal disease: Secondary | ICD-10-CM

## 2018-11-13 DIAGNOSIS — G4733 Obstructive sleep apnea (adult) (pediatric): Secondary | ICD-10-CM | POA: Diagnosis not present

## 2018-11-13 DIAGNOSIS — I2510 Atherosclerotic heart disease of native coronary artery without angina pectoris: Secondary | ICD-10-CM | POA: Diagnosis not present

## 2018-11-13 DIAGNOSIS — Z992 Dependence on renal dialysis: Secondary | ICD-10-CM | POA: Diagnosis not present

## 2018-11-13 DIAGNOSIS — E782 Mixed hyperlipidemia: Secondary | ICD-10-CM

## 2018-11-13 DIAGNOSIS — Z9989 Dependence on other enabling machines and devices: Secondary | ICD-10-CM | POA: Diagnosis not present

## 2018-11-13 MED ORDER — HYDRALAZINE HCL 50 MG PO TABS: 50.0000 mg | ORAL_TABLET | Freq: Three times a day (TID) | ORAL | 3 refills | Status: DC

## 2018-11-13 NOTE — Patient Instructions (Signed)
Medication Instructions:  Increase: Hydralazine 50 mg three times a day If you need a refill on your cardiac medications before your next appointment, please call your pharmacy.   Lab work: None  Testing/Procedures: None  Follow-Up: At [facility], you and your health needs are our priority.  As part of our continuing mission to provide you with exceptional heart care, we have created designated Provider Care Teams.  These Care Teams include your primary Cardiologist (physician) and Advanced Practice Providers (APPs -  Physician Assistants and Nurse Practitioners) who all work together to provide you with the care you need, when you need it. You will need a follow up appointment in 1 years.  Please call our office 2 months in advance to schedule this appointment.  You may see Dr. Debara Pickett or one of the following Advanced Practice Providers on your designated Care Team: Almyra Deforest, [place] . Fabian Sharp, PA-C .

## 2018-11-13 NOTE — Progress Notes (Signed)
Virtual Visit via Video Note   This visit type was conducted due to national recommendations for restrictions regarding the COVID-19 Pandemic (e.g. social distancing) in an effort to limit this patient's exposure and mitigate transmission in our community.  Due to his co-morbid illnesses, this patient is at least at moderate risk for complications without adequate follow up.  This format is felt to be most appropriate for this patient at this time.  All issues noted in this document were discussed and addressed.  A limited physical exam was performed with this format.  Please refer to the patient's chart for his consent to telehealth for [facility].   Evaluation Performed:  Doximity video visit  Date:  11/13/2018   ID:  [name] [name], [name] [name] 09-26-1950, MRN [id number]  Patient Location:  [place] [id number]  Provider location:   [address], [place], [place] [address]  PCP:  [name], MD  Cardiologist:  No primary care provider on file. Electrophysiologist:  None   Chief Complaint:  No complaints  History of Present Illness:    [name] [name] is a 68 y.o. male who presents via audio/video conferencing for a telehealth visit today.  [name] [name] is a pleasant 68 year old male who was unfortunately hospitalized in February with chest pain.  He has a history of minimal coronary artery disease which was nonobstructive in 2014.  Unfortunately had uncontrolled hypertension, end-stage renal disease, COPD, type 2 diabetes, dyslipidemia and other medical problems.  He had a fistula placed for plans for hemodialysis however had trouble with access of the fistula and ultimately underwent PD catheter placement.  He has been doing home PD with good success.  In February this year he was admitted with chest pain.  Is felt to be atypical and seen by Dr. Harl Bowie my partner.  He is troponin was mildly elevated however remained flat mildly elevated between 0.04  and 0.05.  He underwent an echocardiogram which showed normal systolic function with severe LVH.  Nuclear stress test was performed which showed no reversible ischemia.  Urine drug screen did show positivity for cocaine.  I addressed that with him today and he said this was a one-time slip up and he said he has since avoided any recreational drug use.  He denies any further chest pain or worsening shortness of breath.  His hemoglobin A1c most recently in February was 5.5, showing excellent control of his diabetes.  Additionally, his total cholesterol was less than 100 with LDL less than 70 indicating good control of his lipid profile.  The patient does not have symptoms concerning for COVID-19 infection (fever, chills, cough, or new SHORTNESS OF BREATH).    Prior CV studies:   The following studies were reviewed today:  Recent hospitalization Laboratory work from February  PMHx:  Past Medical History:  Diagnosis Date   Anemia    Arthritis    Asthma    Bell palsy    CAD (coronary artery disease)    a. 2014 MV: abnl w/ infap ischemia; b. 03/2013 Cath: aneurysmal bleb in the LAD w/ otw nonobs dzs-->Med Rx.   Chronic back pain    Chronic knee pain    a. 09/2015 s/p R TKA.   Chronic pain    Chronic shoulder pain    Chronic sinusitis    CKD (chronic kidney disease), stage IV (HCC)    stage 4 per office visit note of Dr Lowanda Foster on 05/2015    COPD (chronic  obstructive pulmonary disease) (HCC)    Diabetes mellitus without complication ([place])    type II    Essential hypertension    GERD (gastroesophageal reflux disease)    Gout    Gout    Hepatomegaly    noted on noncontrast CT 2015   History of hiatal hernia    Hyperlipidemia    Lateral meniscus tear    Obesity    Truncal   Obstructive sleep apnea    does not use cpap    On home oxygen therapy    uses 2l when is going somewhere per patient    PUD (peptic ulcer disease)    remote, reports f/u EGD about 8  years ago unremarkable    Reactive airway disease    related to exposure to chemical during 9/11   Renal insufficiency    Sinusitis    Vitamin D deficiency     Past Surgical History:  Procedure Laterality Date   ASAD LT SHOULDER  12/2008   left shoulder   AV FISTULA PLACEMENT Left 08/09/2016   Procedure: BRACHIOCEPHALIC ARTERIOVENOUS (AV) FISTULA CREATION LEFT ARM;  Surgeon: [name], MD;  Location: [facility];  Service: Vascular;  Laterality: Left;   CAPD INSERTION N/A 10/07/2018   Procedure: LAPAROSCOPIC PERITONEAL CATHETER PLACEMENT;  Surgeon: Mickeal Skinner, MD;  Location: [facility];  Service: General;  Laterality: N/A;   CATARACT EXTRACTION W/PHACO Left 03/28/2016   Procedure: CATARACT EXTRACTION PHACO AND INTRAOCULAR LENS PLACEMENT LEFT EYE;  Surgeon: [name], MD;  Location: [facility];  Service: Ophthalmology;  Laterality: Left;  CDE: 4.77   CATARACT EXTRACTION W/PHACO Right 04/11/2016   Procedure: CATARACT EXTRACTION PHACO AND INTRAOCULAR LENS PLACEMENT RIGHT EYE; CDE:  4.74;  Surgeon: [name], MD;  Location: [facility];  Service: Ophthalmology;  Laterality: Right;   COLONOSCOPY  10/2008   Fields: Rectal polyp obliterated, not retrieved, hemorrhoids, single ascending colon diverticulum near the CV. Next colonoscopy April 2020   COLONOSCOPY N/A 12/25/2014   SLF: 1. Colorectal polyps (2) removed 2. Small internal hemorrhoids 3. the left colon is severely redundant   DOPPLER ECHOCARDIOGRAPHY     ESOPHAGOGASTRODUODENOSCOPY N/A 12/25/2014   SLF: 1. Anemia most likely due to CRI, gastritis, gastric polyps 2. Moderate non-erosive gastriits and mild duodenitis.  3.TWo large gstric polyps removed.    EYE SURGERY  12/22/2010   tear duct probing-[place]   FOREIGN BODY REMOVAL  03/29/2011   Procedure: REMOVAL FOREIGN BODY EXTREMITY;  Surgeon: Arther Abbott, MD;  Location: [facility];  Service: Orthopedics;  Laterality: Right;  Removal Foreign Body Right Thumb   IR FLUORO  GUIDE CV LINE RIGHT  08/06/2018   IR US GUIDE VASC ACCESS RIGHT  08/06/2018   KNEE ARTHROSCOPY  10/2007   left   KNEE ARTHROSCOPY WITH LATERAL MENISECTOMY Right 10/14/2015   Procedure: LEFT KNEE ARTHROSCOPY WITH PARTIAL LATERAL MENISECTOMY;  Surgeon: Carole Civil, MD;  Location: [facility];  Service: Orthopedics;  Laterality: Right;   LEFT HEART CATHETERIZATION WITH CORONARY ANGIOGRAM N/A 03/28/2013   Procedure: LEFT HEART CATHETERIZATION WITH CORONARY ANGIOGRAM;  Surgeon: Leonie Man, MD;  Location[facility];  Service: Cardiovascular;  Laterality: N/A;   NM MYOVIEW LTD     PENILE PROSTHESIS IMPLANT N/A 08/16/2015   Procedure: PENILE PROTHESIS INFLATABLE, three piece, Excisional biopsy of Penile ulcer, Penile molding;  Surgeon: Carolan Clines, MD;  Location: [facility];  Service: Urology;  Laterality: N/A;   PENILE PROSTHESIS IMPLANT N/A 12/24/2017   Procedure: REMOVAL  AND  REPLACEMENT  COLOPLAST PENILE PROSTHESIS;  Surgeon: Lucas Mallow, MD;  Location: [facility];  Service: Urology;  Laterality: N/A;   QUADRICEPS TENDON REPAIR  07/21/2011   Procedure: REPAIR QUADRICEP TENDON;  Surgeon: Arther Abbott, MD;  Location: [facility];  Service: Orthopedics;  Laterality: Right;   TOENAIL EXCISION     removed [id number]   UMBILICAL HERNIA REPAIR  2007   [place]    FAMHx:  Family History  Problem Relation Age of Onset   Hypertension Mother        MI   Cancer Mother        breast    Diabetes Mother    Diabetes Father    Hypertension Father    Hypertension Sister    Diabetes Sister    Arthritis Other    Asthma Other    Lung disease Other    Anesthesia problems Neg Hx    Hypotension Neg Hx    Malignant hyperthermia Neg Hx    Pseudochol deficiency Neg Hx    Colon cancer Neg Hx     SOCHx:   reports that he quit smoking about 8 years ago. His smoking use included cigarettes. He has a 25.00 pack-year smoking history. He has never used smokeless tobacco. He  reports current alcohol use. He reports current drug use. Drug: Marijuana.  ALLERGIES:  Allergies  Allergen Reactions   Tramadol Hcl    Opana [Oxymorphone Hcl] Itching   Oxymorphone Itching   Tramadol Itching    MEDS:  Current Meds  Medication Sig   allopurinol (ZYLOPRIM) 100 MG tablet Take 200 mg by mouth daily.    amLODipine (NORVASC) 10 MG tablet Take 10 mg by mouth daily.   aspirin EC 81 MG tablet Take 1 tablet (81 mg total) by mouth daily.   budesonide-formoterol (SYMBICORT) 160-4.5 MCG/ACT inhaler Inhale 2 puffs into the lungs 2 (two) times daily as needed (Shortness of Breath).    calcitRIOL (ROCALTROL) 0.25 MCG capsule Take 0.25 mcg by mouth daily.   cetirizine (ZYRTEC) 10 MG tablet Take 10 mg by mouth at bedtime as needed for allergies.    folic acid (FOLVITE) 1 MG tablet Take 1 tablet by mouth daily.   hydrALAZINE (APRESOLINE) 25 MG tablet Take 25 mg by mouth 3 (three) times daily.   Insulin Glargine (LANTUS SOLOSTAR) 100 UNIT/ML Solostar Pen INJECT 20 UNITS SUBCUTANEOUSLY AT BEDTIME.   Insulin Pen Needle (B-D UF III MINI PEN NEEDLES) 31G X 5 MM MISC INJECT SUBCUTANEOUSLY TWICE DAILY AS DIRECTED   linagliptin (TRADJENTA) 5 MG TABS tablet Take 1 tablet (5 mg total) by mouth daily.   mometasone (NASONEX) 50 MCG/ACT nasal spray Place 2 sprays into the nose 2 (two) times daily as needed (allergies).    pantoprazole (PROTONIX) 40 MG tablet TAKE 1 TABLET TWICE DAILY 30 MINUTES PRIOR TO MEALS   simvastatin (ZOCOR) 20 MG tablet Take 20 mg by mouth every morning.      ROS: Pertinent items noted in HPI and remainder of comprehensive ROS otherwise negative.  Labs/Other Tests and Data Reviewed:    Recent Labs: 03/04/2018: TSH 0.83 10/07/2018: BUN 42; Creatinine, Ser 7.29; Hemoglobin 10.2; Platelets 193; Potassium 3.7; Sodium 138   Recent Lipid Panel Lab Results  Component Value Date/Time   CHOL 105 09/06/2018 10:18 AM   TRIG 69 09/06/2018 10:18 AM   HDL 38  (L) 09/06/2018 10:18 AM   CHOLHDL 2.8 09/06/2018 10:18 AM   LDLCALC 52 09/06/2018 10:18 AM  Wt Readings from Last 3 Encounters:  11/13/18 222 lb 0.1 oz (100.7 kg)  10/07/18 231 lb 7 oz (105 kg)  10/04/18 231 lb 7 oz (105 kg)     Exam:    Vital Signs:  BP (!) 159/87    Ht 5\' 4"  (1.626 m)    Wt 222 lb 0.1 oz (100.7 kg)    BMI 38.11 kg/m    General appearance: alert, no distress and moderately obese Lungs: No audible wheezing or visible respiratory difficulty Extremities: extremities normal, atraumatic, no cyanosis or edema Skin: Skin color, texture, turgor normal. No rashes or lesions Neurologic: Mental status: Alert, oriented, thought content appropriate Psych: Pleasant  ASSESSMENT & PLAN:    1. Mild coronary artery disease by cath in September 2014, question plaque rupture-on Plavix (no stent) 2. LVEF 50 to 55%, severe LVH, grade 2 diastolic dysfunction, moderate biatrial enlargement (08/2018) 3. Low risk Myoview stress test (08/2018) 4. Insulin-dependent diabetes 5. Dyslipidemia-controlled 6. Hypertension 7. Morbid obesity 8. Erectile dysfunction - s/p implant 9. OSA on CPAP 10. ESRD - s/p LUE AV fistula, not yet on dialysis  [name] [name] presented in February 2020 with chest pain which was felt to be atypical.  He had flat mildly elevated troponins.  He is end-stage renal disease on PD.  Unfortunately his UDS was positive for cocaine however he said this was a one-time slip up.  He has not used any recreational drugs since then.  He is echo does show hypertensive heart disease with biatrial enlargement, severe LVH and grade 2 diastolic dysfunction.  Some of his symptoms may be due to volume overload.  He is stress test fortunately was negative for ischemia and he had very mild coronary disease by cath in 2014.  His A1c is well controlled at 5.5 and his LDL cholesterol is well below 70.  Overall he seems to be doing well.  He reports intermittent use of his CPAP and I encouraged  him to use it more regularly.  Have also encouraged more exercise and weight loss.  Finally, his blood pressure is elevated today.  It generally runs around 140- 150 he has noticed since starting peritoneal dialysis.  I would recommend an increase in his hydralazine from 25 mg 3 times daily to 50 mg 3 times daily for better blood pressure control.  Plan follow-up with me annually or sooner as necessary.  COVID-19 Education: The signs and symptoms of COVID-19 were discussed with the patient and how to seek care for testing (follow up with PCP or arrange E-visit).  The importance of social distancing was discussed today.  Patient Risk:   After full review of this patients clinical status, I feel that they are at least moderate risk at this time.  Time:   Today, I have spent 25 minutes with the patient with telehealth technology discussing dialysis, recent hospitalization, chest pain, echo and nuclear stress test findings, review of lab work, discussion about sleep apnea, healthy lifestyle, and hypertension management.     Medication Adjustments/Labs and Tests Ordered: Current medicines are reviewed at length with the patient today.  Concerns regarding medicines are outlined above.   Tests Ordered: No orders of the defined types were placed in this encounter.   Medication Changes: No orders of the defined types were placed in this encounter.   Disposition:  in 1 year(s)  Pixie Casino, MD, FACC, [place] Director of the [facility] Diplomate of the American Board of Clinical Lipidology Attending Cardiologist  Direct Dial: [phone]   Fax: [phone]  Website:  [email or web address]  Pixie Casino, MD  11/13/2018 9:43 AM

## 2018-11-14 DIAGNOSIS — N186 End stage renal disease: Secondary | ICD-10-CM | POA: Diagnosis not present

## 2018-11-14 DIAGNOSIS — Z992 Dependence on renal dialysis: Secondary | ICD-10-CM | POA: Diagnosis not present

## 2018-11-15 DIAGNOSIS — N2581 Secondary hyperparathyroidism of renal origin: Secondary | ICD-10-CM | POA: Diagnosis not present

## 2018-11-15 DIAGNOSIS — N186 End stage renal disease: Secondary | ICD-10-CM | POA: Diagnosis not present

## 2018-11-15 DIAGNOSIS — Z992 Dependence on renal dialysis: Secondary | ICD-10-CM | POA: Diagnosis not present

## 2018-11-15 DIAGNOSIS — D631 Anemia in chronic kidney disease: Secondary | ICD-10-CM | POA: Diagnosis not present

## 2018-11-15 DIAGNOSIS — D509 Iron deficiency anemia, unspecified: Secondary | ICD-10-CM | POA: Diagnosis not present

## 2018-11-16 DIAGNOSIS — N186 End stage renal disease: Secondary | ICD-10-CM | POA: Diagnosis not present

## 2018-11-16 DIAGNOSIS — N2581 Secondary hyperparathyroidism of renal origin: Secondary | ICD-10-CM | POA: Diagnosis not present

## 2018-11-16 DIAGNOSIS — D509 Iron deficiency anemia, unspecified: Secondary | ICD-10-CM | POA: Diagnosis not present

## 2018-11-16 DIAGNOSIS — Z992 Dependence on renal dialysis: Secondary | ICD-10-CM | POA: Diagnosis not present

## 2018-11-16 DIAGNOSIS — D631 Anemia in chronic kidney disease: Secondary | ICD-10-CM | POA: Diagnosis not present

## 2018-11-17 DIAGNOSIS — D631 Anemia in chronic kidney disease: Secondary | ICD-10-CM | POA: Diagnosis not present

## 2018-11-17 DIAGNOSIS — Z992 Dependence on renal dialysis: Secondary | ICD-10-CM | POA: Diagnosis not present

## 2018-11-17 DIAGNOSIS — D509 Iron deficiency anemia, unspecified: Secondary | ICD-10-CM | POA: Diagnosis not present

## 2018-11-17 DIAGNOSIS — N186 End stage renal disease: Secondary | ICD-10-CM | POA: Diagnosis not present

## 2018-11-17 DIAGNOSIS — N2581 Secondary hyperparathyroidism of renal origin: Secondary | ICD-10-CM | POA: Diagnosis not present

## 2018-11-18 DIAGNOSIS — D509 Iron deficiency anemia, unspecified: Secondary | ICD-10-CM | POA: Diagnosis not present

## 2018-11-18 DIAGNOSIS — Z992 Dependence on renal dialysis: Secondary | ICD-10-CM | POA: Diagnosis not present

## 2018-11-18 DIAGNOSIS — N2581 Secondary hyperparathyroidism of renal origin: Secondary | ICD-10-CM | POA: Diagnosis not present

## 2018-11-18 DIAGNOSIS — N186 End stage renal disease: Secondary | ICD-10-CM | POA: Diagnosis not present

## 2018-11-18 DIAGNOSIS — D631 Anemia in chronic kidney disease: Secondary | ICD-10-CM | POA: Diagnosis not present

## 2018-11-19 DIAGNOSIS — N2581 Secondary hyperparathyroidism of renal origin: Secondary | ICD-10-CM | POA: Diagnosis not present

## 2018-11-19 DIAGNOSIS — D509 Iron deficiency anemia, unspecified: Secondary | ICD-10-CM | POA: Diagnosis not present

## 2018-11-19 DIAGNOSIS — Z992 Dependence on renal dialysis: Secondary | ICD-10-CM | POA: Diagnosis not present

## 2018-11-19 DIAGNOSIS — N186 End stage renal disease: Secondary | ICD-10-CM | POA: Diagnosis not present

## 2018-11-19 DIAGNOSIS — D631 Anemia in chronic kidney disease: Secondary | ICD-10-CM | POA: Diagnosis not present

## 2018-11-20 DIAGNOSIS — N2581 Secondary hyperparathyroidism of renal origin: Secondary | ICD-10-CM | POA: Diagnosis not present

## 2018-11-20 DIAGNOSIS — N186 End stage renal disease: Secondary | ICD-10-CM | POA: Diagnosis not present

## 2018-11-20 DIAGNOSIS — D631 Anemia in chronic kidney disease: Secondary | ICD-10-CM | POA: Diagnosis not present

## 2018-11-20 DIAGNOSIS — Z452 Encounter for adjustment and management of vascular access device: Secondary | ICD-10-CM | POA: Diagnosis not present

## 2018-11-20 DIAGNOSIS — D509 Iron deficiency anemia, unspecified: Secondary | ICD-10-CM | POA: Diagnosis not present

## 2018-11-20 DIAGNOSIS — Z992 Dependence on renal dialysis: Secondary | ICD-10-CM | POA: Diagnosis not present

## 2018-11-21 DIAGNOSIS — N186 End stage renal disease: Secondary | ICD-10-CM | POA: Diagnosis not present

## 2018-11-21 DIAGNOSIS — D631 Anemia in chronic kidney disease: Secondary | ICD-10-CM | POA: Diagnosis not present

## 2018-11-21 DIAGNOSIS — Z992 Dependence on renal dialysis: Secondary | ICD-10-CM | POA: Diagnosis not present

## 2018-11-21 DIAGNOSIS — N2581 Secondary hyperparathyroidism of renal origin: Secondary | ICD-10-CM | POA: Diagnosis not present

## 2018-11-21 DIAGNOSIS — D509 Iron deficiency anemia, unspecified: Secondary | ICD-10-CM | POA: Diagnosis not present

## 2018-11-22 ENCOUNTER — Other Ambulatory Visit: Payer: Self-pay | Admitting: Podiatry

## 2018-11-22 ENCOUNTER — Other Ambulatory Visit (HOSPITAL_COMMUNITY): Payer: Self-pay | Admitting: Podiatry

## 2018-11-22 DIAGNOSIS — N186 End stage renal disease: Secondary | ICD-10-CM | POA: Diagnosis not present

## 2018-11-22 DIAGNOSIS — D509 Iron deficiency anemia, unspecified: Secondary | ICD-10-CM | POA: Diagnosis not present

## 2018-11-22 DIAGNOSIS — M25572 Pain in left ankle and joints of left foot: Secondary | ICD-10-CM

## 2018-11-22 DIAGNOSIS — B351 Tinea unguium: Secondary | ICD-10-CM | POA: Diagnosis not present

## 2018-11-22 DIAGNOSIS — N2581 Secondary hyperparathyroidism of renal origin: Secondary | ICD-10-CM | POA: Diagnosis not present

## 2018-11-22 DIAGNOSIS — Z992 Dependence on renal dialysis: Secondary | ICD-10-CM | POA: Diagnosis not present

## 2018-11-22 DIAGNOSIS — D631 Anemia in chronic kidney disease: Secondary | ICD-10-CM | POA: Diagnosis not present

## 2018-11-22 DIAGNOSIS — M79672 Pain in left foot: Secondary | ICD-10-CM | POA: Diagnosis not present

## 2018-11-22 DIAGNOSIS — E1142 Type 2 diabetes mellitus with diabetic polyneuropathy: Secondary | ICD-10-CM | POA: Diagnosis not present

## 2018-11-23 DIAGNOSIS — Z992 Dependence on renal dialysis: Secondary | ICD-10-CM | POA: Diagnosis not present

## 2018-11-23 DIAGNOSIS — D631 Anemia in chronic kidney disease: Secondary | ICD-10-CM | POA: Diagnosis not present

## 2018-11-23 DIAGNOSIS — N186 End stage renal disease: Secondary | ICD-10-CM | POA: Diagnosis not present

## 2018-11-23 DIAGNOSIS — D509 Iron deficiency anemia, unspecified: Secondary | ICD-10-CM | POA: Diagnosis not present

## 2018-11-23 DIAGNOSIS — N2581 Secondary hyperparathyroidism of renal origin: Secondary | ICD-10-CM | POA: Diagnosis not present

## 2018-11-24 DIAGNOSIS — N186 End stage renal disease: Secondary | ICD-10-CM | POA: Diagnosis not present

## 2018-11-24 DIAGNOSIS — D509 Iron deficiency anemia, unspecified: Secondary | ICD-10-CM | POA: Diagnosis not present

## 2018-11-24 DIAGNOSIS — D631 Anemia in chronic kidney disease: Secondary | ICD-10-CM | POA: Diagnosis not present

## 2018-11-24 DIAGNOSIS — N2581 Secondary hyperparathyroidism of renal origin: Secondary | ICD-10-CM | POA: Diagnosis not present

## 2018-11-24 DIAGNOSIS — Z992 Dependence on renal dialysis: Secondary | ICD-10-CM | POA: Diagnosis not present

## 2018-11-25 DIAGNOSIS — N186 End stage renal disease: Secondary | ICD-10-CM | POA: Diagnosis not present

## 2018-11-25 DIAGNOSIS — D631 Anemia in chronic kidney disease: Secondary | ICD-10-CM | POA: Diagnosis not present

## 2018-11-25 DIAGNOSIS — Z992 Dependence on renal dialysis: Secondary | ICD-10-CM | POA: Diagnosis not present

## 2018-11-25 DIAGNOSIS — N2581 Secondary hyperparathyroidism of renal origin: Secondary | ICD-10-CM | POA: Diagnosis not present

## 2018-11-25 DIAGNOSIS — D509 Iron deficiency anemia, unspecified: Secondary | ICD-10-CM | POA: Diagnosis not present

## 2018-11-26 ENCOUNTER — Other Ambulatory Visit: Payer: Self-pay | Admitting: "Endocrinology

## 2018-11-26 DIAGNOSIS — N186 End stage renal disease: Secondary | ICD-10-CM | POA: Diagnosis not present

## 2018-11-26 DIAGNOSIS — D631 Anemia in chronic kidney disease: Secondary | ICD-10-CM | POA: Diagnosis not present

## 2018-11-26 DIAGNOSIS — D509 Iron deficiency anemia, unspecified: Secondary | ICD-10-CM | POA: Diagnosis not present

## 2018-11-26 DIAGNOSIS — N2581 Secondary hyperparathyroidism of renal origin: Secondary | ICD-10-CM | POA: Diagnosis not present

## 2018-11-26 DIAGNOSIS — Z992 Dependence on renal dialysis: Secondary | ICD-10-CM | POA: Diagnosis not present

## 2018-11-27 DIAGNOSIS — D509 Iron deficiency anemia, unspecified: Secondary | ICD-10-CM | POA: Diagnosis not present

## 2018-11-27 DIAGNOSIS — N186 End stage renal disease: Secondary | ICD-10-CM | POA: Diagnosis not present

## 2018-11-27 DIAGNOSIS — N2581 Secondary hyperparathyroidism of renal origin: Secondary | ICD-10-CM | POA: Diagnosis not present

## 2018-11-27 DIAGNOSIS — Z992 Dependence on renal dialysis: Secondary | ICD-10-CM | POA: Diagnosis not present

## 2018-11-27 DIAGNOSIS — D631 Anemia in chronic kidney disease: Secondary | ICD-10-CM | POA: Diagnosis not present

## 2018-11-28 DIAGNOSIS — N2581 Secondary hyperparathyroidism of renal origin: Secondary | ICD-10-CM | POA: Diagnosis not present

## 2018-11-28 DIAGNOSIS — Z992 Dependence on renal dialysis: Secondary | ICD-10-CM | POA: Diagnosis not present

## 2018-11-28 DIAGNOSIS — D631 Anemia in chronic kidney disease: Secondary | ICD-10-CM | POA: Diagnosis not present

## 2018-11-28 DIAGNOSIS — N186 End stage renal disease: Secondary | ICD-10-CM | POA: Diagnosis not present

## 2018-11-28 DIAGNOSIS — D509 Iron deficiency anemia, unspecified: Secondary | ICD-10-CM | POA: Diagnosis not present

## 2018-11-29 ENCOUNTER — Ambulatory Visit (HOSPITAL_COMMUNITY)
Admission: RE | Admit: 2018-11-29 | Discharge: 2018-11-29 | Disposition: A | Discharge status: Home / Self Care | Payer: Medicare Other | Source: Ambulatory Visit | Attending: Podiatry | Admitting: Podiatry

## 2018-11-29 ENCOUNTER — Other Ambulatory Visit: Payer: Self-pay

## 2018-11-29 DIAGNOSIS — M25572 Pain in left ankle and joints of left foot: Secondary | ICD-10-CM

## 2018-11-29 DIAGNOSIS — D509 Iron deficiency anemia, unspecified: Secondary | ICD-10-CM | POA: Diagnosis not present

## 2018-11-29 DIAGNOSIS — N2581 Secondary hyperparathyroidism of renal origin: Secondary | ICD-10-CM | POA: Diagnosis not present

## 2018-11-29 DIAGNOSIS — N186 End stage renal disease: Secondary | ICD-10-CM | POA: Diagnosis not present

## 2018-11-29 DIAGNOSIS — D631 Anemia in chronic kidney disease: Secondary | ICD-10-CM | POA: Diagnosis not present

## 2018-11-29 DIAGNOSIS — M7672 Peroneal tendinitis, left leg: Secondary | ICD-10-CM | POA: Diagnosis not present

## 2018-11-29 DIAGNOSIS — Z992 Dependence on renal dialysis: Secondary | ICD-10-CM | POA: Diagnosis not present

## 2018-11-30 DIAGNOSIS — D631 Anemia in chronic kidney disease: Secondary | ICD-10-CM | POA: Diagnosis not present

## 2018-11-30 DIAGNOSIS — D509 Iron deficiency anemia, unspecified: Secondary | ICD-10-CM | POA: Diagnosis not present

## 2018-11-30 DIAGNOSIS — N186 End stage renal disease: Secondary | ICD-10-CM | POA: Diagnosis not present

## 2018-11-30 DIAGNOSIS — Z992 Dependence on renal dialysis: Secondary | ICD-10-CM | POA: Diagnosis not present

## 2018-11-30 DIAGNOSIS — N2581 Secondary hyperparathyroidism of renal origin: Secondary | ICD-10-CM | POA: Diagnosis not present

## 2018-12-01 DIAGNOSIS — N186 End stage renal disease: Secondary | ICD-10-CM | POA: Diagnosis not present

## 2018-12-01 DIAGNOSIS — D631 Anemia in chronic kidney disease: Secondary | ICD-10-CM | POA: Diagnosis not present

## 2018-12-01 DIAGNOSIS — Z992 Dependence on renal dialysis: Secondary | ICD-10-CM | POA: Diagnosis not present

## 2018-12-01 DIAGNOSIS — D509 Iron deficiency anemia, unspecified: Secondary | ICD-10-CM | POA: Diagnosis not present

## 2018-12-01 DIAGNOSIS — N2581 Secondary hyperparathyroidism of renal origin: Secondary | ICD-10-CM | POA: Diagnosis not present

## 2018-12-02 DIAGNOSIS — D631 Anemia in chronic kidney disease: Secondary | ICD-10-CM | POA: Diagnosis not present

## 2018-12-02 DIAGNOSIS — N186 End stage renal disease: Secondary | ICD-10-CM | POA: Diagnosis not present

## 2018-12-02 DIAGNOSIS — Z992 Dependence on renal dialysis: Secondary | ICD-10-CM | POA: Diagnosis not present

## 2018-12-02 DIAGNOSIS — D509 Iron deficiency anemia, unspecified: Secondary | ICD-10-CM | POA: Diagnosis not present

## 2018-12-02 DIAGNOSIS — N2581 Secondary hyperparathyroidism of renal origin: Secondary | ICD-10-CM | POA: Diagnosis not present

## 2018-12-03 DIAGNOSIS — D631 Anemia in chronic kidney disease: Secondary | ICD-10-CM | POA: Diagnosis not present

## 2018-12-03 DIAGNOSIS — N2581 Secondary hyperparathyroidism of renal origin: Secondary | ICD-10-CM | POA: Diagnosis not present

## 2018-12-03 DIAGNOSIS — D509 Iron deficiency anemia, unspecified: Secondary | ICD-10-CM | POA: Diagnosis not present

## 2018-12-03 DIAGNOSIS — Z992 Dependence on renal dialysis: Secondary | ICD-10-CM | POA: Diagnosis not present

## 2018-12-03 DIAGNOSIS — N186 End stage renal disease: Secondary | ICD-10-CM | POA: Diagnosis not present

## 2018-12-04 ENCOUNTER — Other Ambulatory Visit: Payer: Self-pay | Admitting: "Endocrinology

## 2018-12-04 DIAGNOSIS — D509 Iron deficiency anemia, unspecified: Secondary | ICD-10-CM | POA: Diagnosis not present

## 2018-12-04 DIAGNOSIS — Z992 Dependence on renal dialysis: Secondary | ICD-10-CM | POA: Diagnosis not present

## 2018-12-04 DIAGNOSIS — N2581 Secondary hyperparathyroidism of renal origin: Secondary | ICD-10-CM | POA: Diagnosis not present

## 2018-12-04 DIAGNOSIS — N186 End stage renal disease: Secondary | ICD-10-CM | POA: Diagnosis not present

## 2018-12-04 DIAGNOSIS — D631 Anemia in chronic kidney disease: Secondary | ICD-10-CM | POA: Diagnosis not present

## 2018-12-05 DIAGNOSIS — D509 Iron deficiency anemia, unspecified: Secondary | ICD-10-CM | POA: Diagnosis not present

## 2018-12-05 DIAGNOSIS — D631 Anemia in chronic kidney disease: Secondary | ICD-10-CM | POA: Diagnosis not present

## 2018-12-05 DIAGNOSIS — N2581 Secondary hyperparathyroidism of renal origin: Secondary | ICD-10-CM | POA: Diagnosis not present

## 2018-12-05 DIAGNOSIS — Z992 Dependence on renal dialysis: Secondary | ICD-10-CM | POA: Diagnosis not present

## 2018-12-05 DIAGNOSIS — N185 Chronic kidney disease, stage 5: Secondary | ICD-10-CM | POA: Diagnosis not present

## 2018-12-05 DIAGNOSIS — N186 End stage renal disease: Secondary | ICD-10-CM | POA: Diagnosis not present

## 2018-12-05 DIAGNOSIS — E1122 Type 2 diabetes mellitus with diabetic chronic kidney disease: Secondary | ICD-10-CM | POA: Diagnosis not present

## 2018-12-06 DIAGNOSIS — D509 Iron deficiency anemia, unspecified: Secondary | ICD-10-CM | POA: Diagnosis not present

## 2018-12-06 DIAGNOSIS — N2581 Secondary hyperparathyroidism of renal origin: Secondary | ICD-10-CM | POA: Diagnosis not present

## 2018-12-06 DIAGNOSIS — N186 End stage renal disease: Secondary | ICD-10-CM | POA: Diagnosis not present

## 2018-12-06 DIAGNOSIS — D631 Anemia in chronic kidney disease: Secondary | ICD-10-CM | POA: Diagnosis not present

## 2018-12-06 DIAGNOSIS — Z992 Dependence on renal dialysis: Secondary | ICD-10-CM | POA: Diagnosis not present

## 2018-12-06 LAB — T4, FREE: Free T4: 1.2 ng/dL (ref 0.8–1.8)

## 2018-12-06 LAB — HEMOGLOBIN A1C
Hgb A1c MFr Bld: 6.9 % of total Hgb — ABNORMAL HIGH (ref <5.7)
MEAN PLASMA GLUCOSE: 151 (calc)
eAG (mmol/L): 8.4 (calc)

## 2018-12-06 LAB — TSH: TSH: 2.31 mIU/L (ref 0.40–4.50)

## 2018-12-07 DIAGNOSIS — N186 End stage renal disease: Secondary | ICD-10-CM | POA: Diagnosis not present

## 2018-12-07 DIAGNOSIS — D509 Iron deficiency anemia, unspecified: Secondary | ICD-10-CM | POA: Diagnosis not present

## 2018-12-07 DIAGNOSIS — Z992 Dependence on renal dialysis: Secondary | ICD-10-CM | POA: Diagnosis not present

## 2018-12-07 DIAGNOSIS — N2581 Secondary hyperparathyroidism of renal origin: Secondary | ICD-10-CM | POA: Diagnosis not present

## 2018-12-07 DIAGNOSIS — D631 Anemia in chronic kidney disease: Secondary | ICD-10-CM | POA: Diagnosis not present

## 2018-12-08 DIAGNOSIS — N2581 Secondary hyperparathyroidism of renal origin: Secondary | ICD-10-CM | POA: Diagnosis not present

## 2018-12-08 DIAGNOSIS — N186 End stage renal disease: Secondary | ICD-10-CM | POA: Diagnosis not present

## 2018-12-08 DIAGNOSIS — Z992 Dependence on renal dialysis: Secondary | ICD-10-CM | POA: Diagnosis not present

## 2018-12-08 DIAGNOSIS — D631 Anemia in chronic kidney disease: Secondary | ICD-10-CM | POA: Diagnosis not present

## 2018-12-08 DIAGNOSIS — D509 Iron deficiency anemia, unspecified: Secondary | ICD-10-CM | POA: Diagnosis not present

## 2018-12-09 DIAGNOSIS — D509 Iron deficiency anemia, unspecified: Secondary | ICD-10-CM | POA: Diagnosis not present

## 2018-12-09 DIAGNOSIS — N186 End stage renal disease: Secondary | ICD-10-CM | POA: Diagnosis not present

## 2018-12-09 DIAGNOSIS — Z992 Dependence on renal dialysis: Secondary | ICD-10-CM | POA: Diagnosis not present

## 2018-12-09 DIAGNOSIS — N2581 Secondary hyperparathyroidism of renal origin: Secondary | ICD-10-CM | POA: Diagnosis not present

## 2018-12-09 DIAGNOSIS — D631 Anemia in chronic kidney disease: Secondary | ICD-10-CM | POA: Diagnosis not present

## 2018-12-10 DIAGNOSIS — N2581 Secondary hyperparathyroidism of renal origin: Secondary | ICD-10-CM | POA: Diagnosis not present

## 2018-12-10 DIAGNOSIS — N186 End stage renal disease: Secondary | ICD-10-CM | POA: Diagnosis not present

## 2018-12-10 DIAGNOSIS — Z992 Dependence on renal dialysis: Secondary | ICD-10-CM | POA: Diagnosis not present

## 2018-12-10 DIAGNOSIS — D509 Iron deficiency anemia, unspecified: Secondary | ICD-10-CM | POA: Diagnosis not present

## 2018-12-10 DIAGNOSIS — D631 Anemia in chronic kidney disease: Secondary | ICD-10-CM | POA: Diagnosis not present

## 2018-12-11 ENCOUNTER — Other Ambulatory Visit: Payer: Self-pay

## 2018-12-11 ENCOUNTER — Encounter: Payer: Self-pay | Admitting: "Endocrinology

## 2018-12-11 ENCOUNTER — Encounter (INDEPENDENT_AMBULATORY_CARE_PROVIDER_SITE_OTHER): Payer: Self-pay

## 2018-12-11 ENCOUNTER — Ambulatory Visit (INDEPENDENT_AMBULATORY_CARE_PROVIDER_SITE_OTHER): Payer: Medicare Other | Admitting: "Endocrinology

## 2018-12-11 DIAGNOSIS — Z992 Dependence on renal dialysis: Secondary | ICD-10-CM | POA: Diagnosis not present

## 2018-12-11 DIAGNOSIS — D509 Iron deficiency anemia, unspecified: Secondary | ICD-10-CM | POA: Diagnosis not present

## 2018-12-11 DIAGNOSIS — E782 Mixed hyperlipidemia: Secondary | ICD-10-CM

## 2018-12-11 DIAGNOSIS — I2510 Atherosclerotic heart disease of native coronary artery without angina pectoris: Secondary | ICD-10-CM | POA: Diagnosis not present

## 2018-12-11 DIAGNOSIS — N185 Chronic kidney disease, stage 5: Secondary | ICD-10-CM | POA: Diagnosis not present

## 2018-12-11 DIAGNOSIS — I10 Essential (primary) hypertension: Secondary | ICD-10-CM | POA: Diagnosis not present

## 2018-12-11 DIAGNOSIS — E1122 Type 2 diabetes mellitus with diabetic chronic kidney disease: Secondary | ICD-10-CM | POA: Diagnosis not present

## 2018-12-11 DIAGNOSIS — N186 End stage renal disease: Secondary | ICD-10-CM | POA: Diagnosis not present

## 2018-12-11 DIAGNOSIS — N2581 Secondary hyperparathyroidism of renal origin: Secondary | ICD-10-CM | POA: Diagnosis not present

## 2018-12-11 DIAGNOSIS — D631 Anemia in chronic kidney disease: Secondary | ICD-10-CM | POA: Diagnosis not present

## 2018-12-11 NOTE — Progress Notes (Signed)
12/11/2018                                                    Endocrinology Telehealth Visit Follow up Note -During COVID -19 Pandemic  This visit type was conducted due to national recommendations for restrictions regarding the COVID-19 Pandemic  in an effort to limit this patient's exposure and mitigate transmission of the corona virus.  Due to his co-morbid illnesses, ARIV PENROD is at  moderate to high risk for complications without adequate follow up.  This format is felt to be most appropriate for him at this time.  I connected with this patient on 12/11/2018   by telephone and verified that I am speaking with the correct person using two identifiers. [name] [name], 08-04-1950. he has verbally consented to this visit. All issues noted in this document were discussed and addressed. The format was not optimal for physical exam.   Subjective:    Patient ID: [name] [name], male    DOB: [birth year], PCP [name], MD   Past Medical History:  Diagnosis Date  . Anemia   . Arthritis   . Asthma   . Bell palsy   . CAD (coronary artery disease)    a. 2014 MV: abnl w/ infap ischemia; b. 03/2013 Cath: aneurysmal bleb in the LAD w/ otw nonobs dzs-->Med Rx.  . Chronic back pain   . Chronic knee pain    a. 09/2015 s/p R TKA.  Marland Kitchen Chronic pain   . Chronic shoulder pain   . Chronic sinusitis   . CKD (chronic kidney disease), stage IV ([place])    stage 4 per office visit note of Dr Lowanda Foster on 05/2015   . COPD (chronic obstructive pulmonary disease) ([place])   . Diabetes mellitus without complication ([place])    type II   . Essential hypertension   . GERD (gastroesophageal reflux disease)   . Gout   . Gout   . Hepatomegaly    noted on noncontrast CT 2015  . History of hiatal hernia   . Hyperlipidemia   . Lateral meniscus tear   . Obesity    [place]  . Obstructive sleep apnea    does not use cpap   . On home oxygen therapy    uses 2l when is going somewhere per patient   .  PUD (peptic ulcer disease)    remote, reports f/u EGD about 8 years ago unremarkable   . Reactive airway disease    related to exposure to chemical during 9/11  . Renal insufficiency   . Sinusitis   . Vitamin D deficiency    Past Surgical History:  Procedure Laterality Date  . ASAD LT SHOULDER  12/2008   left shoulder  . AV FISTULA PLACEMENT Left 08/09/2016   Procedure: BRACHIOCEPHALIC ARTERIOVENOUS (AV) FISTULA CREATION LEFT ARM;  Surgeon: [name], MD;  Location: [facility];  Service: Vascular;  Laterality: Left;  . CAPD INSERTION N/A 10/07/2018   Procedure: LAPAROSCOPIC PERITONEAL CATHETER PLACEMENT;  Surgeon: Kieth Brightly Arta Bruce, MD;  Location: [facility];  Service: General;  Laterality: N/A;  . CATARACT EXTRACTION W/PHACO Left 03/28/2016   Procedure: CATARACT EXTRACTION PHACO AND INTRAOCULAR LENS PLACEMENT LEFT EYE;  Surgeon: [name], MD;  Location: [facility];  Service:  Ophthalmology;  Laterality: Left;  CDE: 4.77  . CATARACT EXTRACTION W/PHACO Right 04/11/2016   Procedure: CATARACT EXTRACTION PHACO AND INTRAOCULAR LENS PLACEMENT RIGHT EYE; CDE:  4.74;  Surgeon: [name], MD;  Location: [facility];  Service: Ophthalmology;  Laterality: Right;  . COLONOSCOPY  10/2008   Fields: Rectal polyp obliterated, not retrieved, hemorrhoids, single ascending colon diverticulum near the CV. Next colonoscopy April 2020  . COLONOSCOPY N/A 12/25/2014   SLF: 1. Colorectal polyps (2) removed 2. Small internal hemorrhoids 3. the left colon is severely redundant  . DOPPLER ECHOCARDIOGRAPHY    . ESOPHAGOGASTRODUODENOSCOPY N/A 12/25/2014   SLF: 1. Anemia most likely due to CRI, gastritis, gastric polyps 2. Moderate non-erosive gastriits and mild duodenitis.  3.TWo large gstric polyps removed.   Marland Kitchen EYE SURGERY  12/22/2010   tear duct probing-[place]  . FOREIGN BODY REMOVAL  03/29/2011   Procedure: REMOVAL FOREIGN BODY EXTREMITY;  Surgeon: Arther Abbott, MD;  Location: [facility];  Service: Orthopedics;   Laterality: Right;  Removal Foreign Body Right Thumb  . IR FLUORO GUIDE CV LINE RIGHT  08/06/2018  . IR US GUIDE VASC ACCESS RIGHT  08/06/2018  . KNEE ARTHROSCOPY  10/2007   left  . KNEE ARTHROSCOPY WITH LATERAL MENISECTOMY Right 10/14/2015   Procedure: LEFT KNEE ARTHROSCOPY WITH PARTIAL LATERAL MENISECTOMY;  Surgeon: Carole Civil, MD;  Location: [facility];  Service: Orthopedics;  Laterality: Right;  . LEFT HEART CATHETERIZATION WITH CORONARY ANGIOGRAM N/A 03/28/2013   Procedure: LEFT HEART CATHETERIZATION WITH CORONARY ANGIOGRAM;  Surgeon: Leonie Man, MD;  Location: [facility]ATH [facility];  Service: Cardiovascular;  Laterality: N/A;  . NM MYOVIEW LTD    . PENILE PROSTHESIS IMPLANT N/A 08/16/2015   Procedure: PENILE PROTHESIS INFLATABLE, three piece, Excisional biopsy of Penile ulcer, Penile molding;  Surgeon: Carolan Clines, MD;  Location: [facility];  Service: Urology;  Laterality: N/A;  . PENILE PROSTHESIS IMPLANT N/A 12/24/2017   Procedure: REMOVAL AND  REPLACEMENT  COLOPLAST PENILE PROSTHESIS;  Surgeon: Lucas Mallow, MD;  Location: [facility];  Service: Urology;  Laterality: N/A;  . QUADRICEPS TENDON REPAIR  07/21/2011   Procedure: REPAIR QUADRICEP TENDON;  Surgeon: Arther Abbott, MD;  Location: [facility];  Service: Orthopedics;  Laterality: Right;  . TOENAIL EXCISION     removed [id number]  . UMBILICAL HERNIA REPAIR  2007   [place]   Social History   Socioeconomic History  . Marital status: Married    Spouse name: Not on file  . Number of children: 2  . Years of education: 12th grade  . Highest education level: Not on file  Occupational History  . Occupation: disabled  . Occupation:      Employer: UNEMPLOYED  Social Needs  . Financial resource strain: Not on file  . Food insecurity:    Worry: Not on file    Inability: Not on file  . Transportation needs:    Medical: Not on file    Non-medical: Not on file  Tobacco Use  . Smoking status: Former Smoker    Packs/day: 1.00     Years: 25.00    Pack years: 25.00    Types: Cigarettes    Last attempt to quit: 03/27/2010    Years since quitting: 8.7  . Smokeless tobacco: Never Used  . Tobacco comment: Quit x 7 years  Substance and Sexual Activity  . Alcohol use: Yes    Alcohol/week: 0.0 standard drinks    Comment: occasionally  . Drug use: Yes  Types: Marijuana    Comment: cocaine- last time used- 11/24/2017 , marijuana-   . Sexual activity: Not on file  Lifestyle  . Physical activity:    Days per week: Not on file    Minutes per session: Not on file  . Stress: Not on file  Relationships  . Social connections:    Talks on phone: Not on file    Gets together: Not on file    Attends religious service: Not on file    Active member of club or organization: Not on file    Attends meetings of clubs or organizations: Not on file    Relationship status: Not on file  Other Topics Concern  . Not on file  Social History Narrative   He quit smoking in 2010. 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 He developed pulmonary problems, became disabled because of lower airway disease in 2009.       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   Outpatient Encounter Medications as of 12/11/2018  Medication Sig  . allopurinol (ZYLOPRIM) 100 MG tablet Take 200 mg by mouth daily.   Marland Kitchen amLODipine (NORVASC) 10 MG tablet Take 10 mg by mouth daily.  Marland Kitchen aspirin EC 81 MG tablet Take 1 tablet (81 mg total) by mouth daily.  . budesonide-formoterol (SYMBICORT) 160-4.5 MCG/ACT inhaler Inhale 2 puffs into the lungs 2 (two) times daily as needed (Shortness of Breath).   . calcitRIOL (ROCALTROL) 0.25 MCG capsule Take 0.25 mcg by mouth daily.  . cetirizine (ZYRTEC) 10 MG tablet Take 10 mg by mouth at bedtime as needed for allergies.   . folic acid (FOLVITE) 1 MG tablet Take 1 tablet by mouth daily.  . hydrALAZINE (APRESOLINE) 50 MG tablet Take 1 tablet (50 mg total) by mouth 3 (three) times daily.  .  Insulin Glargine (LANTUS SOLOSTAR) 100 UNIT/ML Solostar Pen INJECT 30 UNITS SUBCUTANEOUSLY AT BEDTIME  . Insulin Pen Needle (B-D UF III MINI PEN NEEDLES) 31G X 5 MM MISC INJECT SUBCUTANEOUSLY TWICE DAILY AS DIRECTED  . mometasone (NASONEX) 50 MCG/ACT nasal spray Place 2 sprays into the nose 2 (two) times daily as needed (allergies).   . pantoprazole (PROTONIX) 40 MG tablet TAKE 1 TABLET TWICE DAILY 30 MINUTES PRIOR TO MEALS  . simvastatin (ZOCOR) 20 MG tablet Take 20 mg by mouth every morning.   . TRADJENTA 5 MG TABS tablet TAKE 1 TABLET EVERY DAY   No facility-administered encounter medications on file as of 12/11/2018.    ALLERGIES: Allergies  Allergen Reactions  . Tramadol Hcl   . Opana [Oxymorphone Hcl] Itching  . Oxymorphone Itching  . Tramadol Itching   VACCINATION STATUS: Immunization History  Administered Date(s) Administered  . Td 03/27/2011    Diabetes  He presents for his follow-up diabetic visit. He has type 2 diabetes mellitus. Onset time: He was diagnosed approximately 20 years ago. His disease course has been stable. There are no hypoglycemic associated symptoms. Pertinent negatives for hypoglycemia include no confusion, headaches, pallor or seizures. Pertinent negatives for diabetes include no chest pain, no fatigue, no polydipsia, no polyphagia, no polyuria and no weakness. There are no hypoglycemic complications. Symptoms are stable. Diabetic complications include nephropathy. (Patient with end-stage renal disease now on hemodialysis.) Risk factors for coronary artery disease include dyslipidemia, diabetes mellitus, hypertension, obesity, sedentary lifestyle, tobacco exposure and male sex. Current diabetic treatment includes insulin injections and oral agent (monotherapy). He is compliant with treatment most of the time.  His weight is fluctuating minimally. He is following a generally unhealthy diet. He has had a previous visit with a dietitian. He rarely participates in  exercise. His home blood glucose trend is decreasing steadily. His breakfast blood glucose range is generally 110-130 mg/dl. His overall blood glucose range is 110-130 mg/dl. Eye exam is current.  Hypertension  This is a chronic problem. The current episode started more than 1 year ago. The problem is controlled. Pertinent negatives include no chest pain, headaches, neck pain, palpitations or shortness of breath. Risk factors for coronary artery disease include diabetes mellitus, dyslipidemia, obesity, smoking/tobacco exposure, sedentary lifestyle and male gender. Past treatments include angiotensin blockers. Hypertensive end-organ damage includes kidney disease.  Hyperlipidemia  This is a chronic problem. The current episode started more than 1 year ago. Exacerbating diseases include diabetes and obesity. Pertinent negatives include no chest pain, myalgias or shortness of breath. Current antihyperlipidemic treatment includes statins. Risk factors for coronary artery disease include diabetes mellitus, dyslipidemia, hypertension, male sex and a sedentary lifestyle.     Review of Systems  Constitutional: Negative for chills, fatigue, fever and unexpected weight change.  HENT: Negative for dental problem, mouth sores and trouble swallowing.   Eyes: Negative for visual disturbance.  Respiratory: Negative for cough, choking, chest tightness, shortness of breath and wheezing.   Cardiovascular: Negative for chest pain, palpitations and leg swelling.  Gastrointestinal: Negative for abdominal distention, abdominal pain, constipation, diarrhea, nausea and vomiting.  Endocrine: Negative for polydipsia, polyphagia and polyuria.  Genitourinary: Negative for dysuria, flank pain, hematuria and urgency.  Musculoskeletal: Negative for back pain, gait problem, myalgias and neck pain.  Skin: Negative for pallor, rash and wound.  Neurological: Negative for seizures, syncope, weakness, numbness and headaches.   Psychiatric/Behavioral: Negative for confusion and dysphoric mood.    Objective:    There were no vitals taken for this visit.  Wt Readings from Last 3 Encounters:  11/13/18 222 lb 0.1 oz (100.7 kg)  10/07/18 231 lb 7 oz (105 kg)  10/04/18 231 lb 7 oz (105 kg)      Results for orders placed or performed during the hospital encounter of 19/50/93  Basic metabolic panel  Result Value Ref Range   Sodium 138 135 - 145 mmol/L   Potassium 3.7 3.5 - 5.1 mmol/L   Chloride 104 98 - 111 mmol/L   CO2 21 (L) 22 - 32 mmol/L   Glucose, Bld 137 (H) 70 - 99 mg/dL   BUN 42 (H) 8 - 23 mg/dL   Creatinine, Ser 7.29 (H) 0.61 - 1.24 mg/dL   Calcium 8.8 (L) 8.9 - 10.3 mg/dL   GFR calc non Af Amer 7 (L) >60 mL/min   GFR calc Af Amer 8 (L) >60 mL/min   Anion gap 13 5 - 15  CBC WITH DIFFERENTIAL  Result Value Ref Range   WBC 4.8 4.0 - 10.5 K/uL   RBC 3.96 (L) 4.22 - 5.81 MIL/uL   Hemoglobin 10.2 (L) 13.0 - 17.0 g/dL   HCT 35.7 (L) 39.0 - 52.0 %   MCV 90.2 80.0 - 100.0 fL   MCH 25.8 (L) 26.0 - 34.0 pg   MCHC 28.6 (L) 30.0 - 36.0 g/dL   RDW 17.9 (H) 11.5 - 15.5 %   Platelets 193 150 - 400 K/uL   nRBC 0.0 0.0 - 0.2 %   Neutrophils Relative % 64 %   Neutro Abs 3.1 1.7 - 7.7 K/uL   Lymphocytes Relative 17 %   Lymphs Abs 0.8  0.7 - 4.0 K/uL   Monocytes Relative 11 %   Monocytes Absolute 0.5 0.1 - 1.0 K/uL   Eosinophils Relative 7 %   Eosinophils Absolute 0.3 0.0 - 0.5 K/uL   Basophils Relative 1 %   Basophils Absolute 0.0 0.0 - 0.1 K/uL   Immature Granulocytes 0 %   Abs Immature Granulocytes 0.01 0.00 - 0.07 K/uL  Glucose, capillary  Result Value Ref Range   Glucose-Capillary 154 (H) 70 - 99 mg/dL   Comment 1 Notify RN    Comment 2 Document in Chart   Glucose, capillary  Result Value Ref Range   Glucose-Capillary 155 (H) 70 - 99 mg/dL   Diabetic Labs (most recent): Lab Results  Component Value Date   HGBA1C 6.9 (H) 12/05/2018   HGBA1C 5.7 (H) 10/04/2018   HGBA1C 5.5 09/06/2018    Lipid Panel     Component Value Date/Time   CHOL 105 09/06/2018 1018   TRIG 69 09/06/2018 1018   HDL 38 (L) 09/06/2018 1018   CHOLHDL 2.8 09/06/2018 1018   VLDL 38 (H) 06/16/2016 0956   LDLCALC 52 09/06/2018 1018     Assessment & Plan:   1. Type 2 diabetes mellitus with stage 4 chronic kidney disease, with long-term current use of insulin (HCC)  -His  Diabetes is complicated by end-stage renal disease recently initiated on hemodialysis,  and patient remains at a high risk for more acute and chronic complications of diabetes which include CAD, CVA, CKD, retinopathy, and neuropathy. These are all discussed in detail with the patient.  -His reported glycemic profile is near target, A1c of 6.9%.    Recent labs reviewed. - I have re-counseled the patient on diet management and weight loss  by adopting a carbohydrate restricted / protein rich  Diet.   - Patient admits there is a room for improvement in his diet and drink choices. -  Suggestion is made for him to avoid simple carbohydrates  from his diet including Cakes, Sweet Desserts / Pastries, Ice Cream, Soda (diet and regular), Sweet Tea, Candies, Chips, Cookies, Store Bought Juices, Alcohol in Excess of  1-2 drinks a day, Artificial Sweeteners, and "Sugar-free" Products. This will help patient to have stable blood glucose profile and potentially avoid unintended weight gain.   - Patient is advised to stick to a routine mealtimes to eat 3 meals  a day and avoid unnecessary snacks ( to snack only to correct hypoglycemia). I have also advised him to cut back on alcohol consumption.   - I have approached patient with the following individualized plan to manage diabetes and patient agrees.   -He is advised to stay away from alcohol and smoking. -Based on his glycemic profile, he will need a lower dose of insulin.   -He is advised to continue Lantus 20 units nightly,   associated with monitoring of blood glucose at least 2 times  daily-daily before breakfast and at bedtime.   -He is advised to stay off of Humalog for now.  He will continue Tradjenta 5 mg p.o. daily at breakfast.  He is following with Jearld Fenton, CDE for DM education.   -He will continue to follow-up with nephrology  given stage 4-5 CKD, he has a dialysis fistula in place.    -Patient is not a candidate for metformin and SGLT2 inhibitors due to CKD.  - Patient specific target  for A1c; LDL, HDL, Triglycerides, and  Waist Circumference were discussed in detail.  2) BP/HTN: he is advised to  home monitor blood pressure and report if > 140/90 on 2 separate readings.    His blood pressure is controlled to target.   He is currently on amlodipine and hydralazine for blood pressure control.    3) Lipids/HPL: His recent lipid panel showed controlled LDL at 54.  He is advised to continue simvastatin 20 mg p.o. nightly.   4)  Weight/Diet: He continued to lose weight, lost 15 pounds over the last 6 months.  CDE consult in progress, exercise, and carbohydrates information provided.   5) Chronic Care/Health Maintenance:  -Patient is on ACEI/ARB and Statin medications and encouraged to continue to follow up with Ophthalmology, nephrology (he is status post fistula placement in prep for hemodialysis) Podiatrist at least yearly or according to recommendations, and advised to quit smoking. I have recommended yearly flu vaccine and pneumonia vaccination at least every 5 years; moderate intensity exercise for up to 150 minutes weekly; and  sleep for at least 7 hours a day. - I advised patient to maintain close follow up with his PCP for primary care needs.      - Patient Care Time Today:  25 min, of which >50% was spent in reviewing his  current and  previous labs/studies, his blood glucose readings, previous treatments, and medications doses and developing a plan for long-term care based on the latest recommendations for standards of care.  [name] [name]  participated in the discussions, expressed understanding, and voiced agreement with the above plans.  All questions were answered to his satisfaction. he is encouraged to contact clinic should he have any questions or concerns prior to his return visit.      Follow up plan: -Return in about 4 months (around 04/13/2019) for Follow up with Pre-visit Labs, Meter, and Logs.  Glade Lloyd, MD Phone: [phone]  Fax: [phone]   This note was partially dictated with voice recognition software. Similar sounding words can be transcribed inadequately or may not  be corrected upon review.  12/11/2018, 12:21 PM

## 2018-12-12 DIAGNOSIS — N2581 Secondary hyperparathyroidism of renal origin: Secondary | ICD-10-CM | POA: Diagnosis not present

## 2018-12-12 DIAGNOSIS — Z992 Dependence on renal dialysis: Secondary | ICD-10-CM | POA: Diagnosis not present

## 2018-12-12 DIAGNOSIS — D509 Iron deficiency anemia, unspecified: Secondary | ICD-10-CM | POA: Diagnosis not present

## 2018-12-12 DIAGNOSIS — D631 Anemia in chronic kidney disease: Secondary | ICD-10-CM | POA: Diagnosis not present

## 2018-12-12 DIAGNOSIS — N186 End stage renal disease: Secondary | ICD-10-CM | POA: Diagnosis not present

## 2018-12-13 DIAGNOSIS — D631 Anemia in chronic kidney disease: Secondary | ICD-10-CM | POA: Diagnosis not present

## 2018-12-13 DIAGNOSIS — Z992 Dependence on renal dialysis: Secondary | ICD-10-CM | POA: Diagnosis not present

## 2018-12-13 DIAGNOSIS — D509 Iron deficiency anemia, unspecified: Secondary | ICD-10-CM | POA: Diagnosis not present

## 2018-12-13 DIAGNOSIS — N186 End stage renal disease: Secondary | ICD-10-CM | POA: Diagnosis not present

## 2018-12-13 DIAGNOSIS — N2581 Secondary hyperparathyroidism of renal origin: Secondary | ICD-10-CM | POA: Diagnosis not present

## 2018-12-14 DIAGNOSIS — Z992 Dependence on renal dialysis: Secondary | ICD-10-CM | POA: Diagnosis not present

## 2018-12-14 DIAGNOSIS — N186 End stage renal disease: Secondary | ICD-10-CM | POA: Diagnosis not present

## 2018-12-14 DIAGNOSIS — N2581 Secondary hyperparathyroidism of renal origin: Secondary | ICD-10-CM | POA: Diagnosis not present

## 2018-12-14 DIAGNOSIS — D631 Anemia in chronic kidney disease: Secondary | ICD-10-CM | POA: Diagnosis not present

## 2018-12-14 DIAGNOSIS — D509 Iron deficiency anemia, unspecified: Secondary | ICD-10-CM | POA: Diagnosis not present

## 2018-12-15 DIAGNOSIS — D509 Iron deficiency anemia, unspecified: Secondary | ICD-10-CM | POA: Diagnosis not present

## 2018-12-15 DIAGNOSIS — Z992 Dependence on renal dialysis: Secondary | ICD-10-CM | POA: Diagnosis not present

## 2018-12-15 DIAGNOSIS — D631 Anemia in chronic kidney disease: Secondary | ICD-10-CM | POA: Diagnosis not present

## 2018-12-15 DIAGNOSIS — N186 End stage renal disease: Secondary | ICD-10-CM | POA: Diagnosis not present

## 2018-12-15 DIAGNOSIS — N2581 Secondary hyperparathyroidism of renal origin: Secondary | ICD-10-CM | POA: Diagnosis not present

## 2018-12-16 DIAGNOSIS — D631 Anemia in chronic kidney disease: Secondary | ICD-10-CM | POA: Diagnosis not present

## 2018-12-16 DIAGNOSIS — D509 Iron deficiency anemia, unspecified: Secondary | ICD-10-CM | POA: Diagnosis not present

## 2018-12-16 DIAGNOSIS — N186 End stage renal disease: Secondary | ICD-10-CM | POA: Diagnosis not present

## 2018-12-16 DIAGNOSIS — N2581 Secondary hyperparathyroidism of renal origin: Secondary | ICD-10-CM | POA: Diagnosis not present

## 2018-12-16 DIAGNOSIS — Z992 Dependence on renal dialysis: Secondary | ICD-10-CM | POA: Diagnosis not present

## 2018-12-17 DIAGNOSIS — D631 Anemia in chronic kidney disease: Secondary | ICD-10-CM | POA: Diagnosis not present

## 2018-12-17 DIAGNOSIS — Z992 Dependence on renal dialysis: Secondary | ICD-10-CM | POA: Diagnosis not present

## 2018-12-17 DIAGNOSIS — D509 Iron deficiency anemia, unspecified: Secondary | ICD-10-CM | POA: Diagnosis not present

## 2018-12-17 DIAGNOSIS — N186 End stage renal disease: Secondary | ICD-10-CM | POA: Diagnosis not present

## 2018-12-17 DIAGNOSIS — N2581 Secondary hyperparathyroidism of renal origin: Secondary | ICD-10-CM | POA: Diagnosis not present

## 2018-12-18 DIAGNOSIS — N186 End stage renal disease: Secondary | ICD-10-CM | POA: Diagnosis not present

## 2018-12-18 DIAGNOSIS — N2581 Secondary hyperparathyroidism of renal origin: Secondary | ICD-10-CM | POA: Diagnosis not present

## 2018-12-18 DIAGNOSIS — Z992 Dependence on renal dialysis: Secondary | ICD-10-CM | POA: Diagnosis not present

## 2018-12-18 DIAGNOSIS — M79672 Pain in left foot: Secondary | ICD-10-CM | POA: Insufficient documentation

## 2018-12-18 DIAGNOSIS — D631 Anemia in chronic kidney disease: Secondary | ICD-10-CM | POA: Diagnosis not present

## 2018-12-18 DIAGNOSIS — D509 Iron deficiency anemia, unspecified: Secondary | ICD-10-CM | POA: Diagnosis not present

## 2018-12-19 DIAGNOSIS — N2581 Secondary hyperparathyroidism of renal origin: Secondary | ICD-10-CM | POA: Diagnosis not present

## 2018-12-19 DIAGNOSIS — N186 End stage renal disease: Secondary | ICD-10-CM | POA: Diagnosis not present

## 2018-12-19 DIAGNOSIS — Z992 Dependence on renal dialysis: Secondary | ICD-10-CM | POA: Diagnosis not present

## 2018-12-19 DIAGNOSIS — D631 Anemia in chronic kidney disease: Secondary | ICD-10-CM | POA: Diagnosis not present

## 2018-12-19 DIAGNOSIS — D509 Iron deficiency anemia, unspecified: Secondary | ICD-10-CM | POA: Diagnosis not present

## 2018-12-20 DIAGNOSIS — N186 End stage renal disease: Secondary | ICD-10-CM | POA: Diagnosis not present

## 2018-12-20 DIAGNOSIS — D631 Anemia in chronic kidney disease: Secondary | ICD-10-CM | POA: Diagnosis not present

## 2018-12-20 DIAGNOSIS — D509 Iron deficiency anemia, unspecified: Secondary | ICD-10-CM | POA: Diagnosis not present

## 2018-12-20 DIAGNOSIS — N2581 Secondary hyperparathyroidism of renal origin: Secondary | ICD-10-CM | POA: Diagnosis not present

## 2018-12-20 DIAGNOSIS — Z992 Dependence on renal dialysis: Secondary | ICD-10-CM | POA: Diagnosis not present

## 2018-12-21 DIAGNOSIS — N2581 Secondary hyperparathyroidism of renal origin: Secondary | ICD-10-CM | POA: Diagnosis not present

## 2018-12-21 DIAGNOSIS — Z992 Dependence on renal dialysis: Secondary | ICD-10-CM | POA: Diagnosis not present

## 2018-12-21 DIAGNOSIS — D631 Anemia in chronic kidney disease: Secondary | ICD-10-CM | POA: Diagnosis not present

## 2018-12-21 DIAGNOSIS — D509 Iron deficiency anemia, unspecified: Secondary | ICD-10-CM | POA: Diagnosis not present

## 2018-12-21 DIAGNOSIS — N186 End stage renal disease: Secondary | ICD-10-CM | POA: Diagnosis not present

## 2018-12-22 DIAGNOSIS — D509 Iron deficiency anemia, unspecified: Secondary | ICD-10-CM | POA: Diagnosis not present

## 2018-12-22 DIAGNOSIS — D631 Anemia in chronic kidney disease: Secondary | ICD-10-CM | POA: Diagnosis not present

## 2018-12-22 DIAGNOSIS — Z992 Dependence on renal dialysis: Secondary | ICD-10-CM | POA: Diagnosis not present

## 2018-12-22 DIAGNOSIS — N186 End stage renal disease: Secondary | ICD-10-CM | POA: Diagnosis not present

## 2018-12-22 DIAGNOSIS — N2581 Secondary hyperparathyroidism of renal origin: Secondary | ICD-10-CM | POA: Diagnosis not present

## 2018-12-23 DIAGNOSIS — Z992 Dependence on renal dialysis: Secondary | ICD-10-CM | POA: Diagnosis not present

## 2018-12-23 DIAGNOSIS — D631 Anemia in chronic kidney disease: Secondary | ICD-10-CM | POA: Diagnosis not present

## 2018-12-23 DIAGNOSIS — D509 Iron deficiency anemia, unspecified: Secondary | ICD-10-CM | POA: Diagnosis not present

## 2018-12-23 DIAGNOSIS — N2581 Secondary hyperparathyroidism of renal origin: Secondary | ICD-10-CM | POA: Diagnosis not present

## 2018-12-23 DIAGNOSIS — N186 End stage renal disease: Secondary | ICD-10-CM | POA: Diagnosis not present

## 2018-12-24 DIAGNOSIS — Z992 Dependence on renal dialysis: Secondary | ICD-10-CM | POA: Diagnosis not present

## 2018-12-24 DIAGNOSIS — D509 Iron deficiency anemia, unspecified: Secondary | ICD-10-CM | POA: Diagnosis not present

## 2018-12-24 DIAGNOSIS — N186 End stage renal disease: Secondary | ICD-10-CM | POA: Diagnosis not present

## 2018-12-24 DIAGNOSIS — D631 Anemia in chronic kidney disease: Secondary | ICD-10-CM | POA: Diagnosis not present

## 2018-12-24 DIAGNOSIS — N2581 Secondary hyperparathyroidism of renal origin: Secondary | ICD-10-CM | POA: Diagnosis not present

## 2018-12-25 DIAGNOSIS — Z992 Dependence on renal dialysis: Secondary | ICD-10-CM | POA: Diagnosis not present

## 2018-12-25 DIAGNOSIS — D631 Anemia in chronic kidney disease: Secondary | ICD-10-CM | POA: Diagnosis not present

## 2018-12-25 DIAGNOSIS — D509 Iron deficiency anemia, unspecified: Secondary | ICD-10-CM | POA: Diagnosis not present

## 2018-12-25 DIAGNOSIS — N186 End stage renal disease: Secondary | ICD-10-CM | POA: Diagnosis not present

## 2018-12-25 DIAGNOSIS — N2581 Secondary hyperparathyroidism of renal origin: Secondary | ICD-10-CM | POA: Diagnosis not present

## 2018-12-26 DIAGNOSIS — D631 Anemia in chronic kidney disease: Secondary | ICD-10-CM | POA: Diagnosis not present

## 2018-12-26 DIAGNOSIS — N186 End stage renal disease: Secondary | ICD-10-CM | POA: Diagnosis not present

## 2018-12-26 DIAGNOSIS — Z992 Dependence on renal dialysis: Secondary | ICD-10-CM | POA: Diagnosis not present

## 2018-12-26 DIAGNOSIS — D509 Iron deficiency anemia, unspecified: Secondary | ICD-10-CM | POA: Diagnosis not present

## 2018-12-26 DIAGNOSIS — N2581 Secondary hyperparathyroidism of renal origin: Secondary | ICD-10-CM | POA: Diagnosis not present

## 2018-12-27 DIAGNOSIS — Z992 Dependence on renal dialysis: Secondary | ICD-10-CM | POA: Diagnosis not present

## 2018-12-27 DIAGNOSIS — D631 Anemia in chronic kidney disease: Secondary | ICD-10-CM | POA: Diagnosis not present

## 2018-12-27 DIAGNOSIS — N2581 Secondary hyperparathyroidism of renal origin: Secondary | ICD-10-CM | POA: Diagnosis not present

## 2018-12-27 DIAGNOSIS — N186 End stage renal disease: Secondary | ICD-10-CM | POA: Diagnosis not present

## 2018-12-27 DIAGNOSIS — D509 Iron deficiency anemia, unspecified: Secondary | ICD-10-CM | POA: Diagnosis not present

## 2018-12-28 DIAGNOSIS — D631 Anemia in chronic kidney disease: Secondary | ICD-10-CM | POA: Diagnosis not present

## 2018-12-28 DIAGNOSIS — N186 End stage renal disease: Secondary | ICD-10-CM | POA: Diagnosis not present

## 2018-12-28 DIAGNOSIS — Z992 Dependence on renal dialysis: Secondary | ICD-10-CM | POA: Diagnosis not present

## 2018-12-28 DIAGNOSIS — N2581 Secondary hyperparathyroidism of renal origin: Secondary | ICD-10-CM | POA: Diagnosis not present

## 2018-12-28 DIAGNOSIS — D509 Iron deficiency anemia, unspecified: Secondary | ICD-10-CM | POA: Diagnosis not present

## 2018-12-29 DIAGNOSIS — D631 Anemia in chronic kidney disease: Secondary | ICD-10-CM | POA: Diagnosis not present

## 2018-12-29 DIAGNOSIS — N2581 Secondary hyperparathyroidism of renal origin: Secondary | ICD-10-CM | POA: Diagnosis not present

## 2018-12-29 DIAGNOSIS — D509 Iron deficiency anemia, unspecified: Secondary | ICD-10-CM | POA: Diagnosis not present

## 2018-12-29 DIAGNOSIS — Z992 Dependence on renal dialysis: Secondary | ICD-10-CM | POA: Diagnosis not present

## 2018-12-29 DIAGNOSIS — N186 End stage renal disease: Secondary | ICD-10-CM | POA: Diagnosis not present

## 2018-12-30 DIAGNOSIS — D631 Anemia in chronic kidney disease: Secondary | ICD-10-CM | POA: Diagnosis not present

## 2018-12-30 DIAGNOSIS — N186 End stage renal disease: Secondary | ICD-10-CM | POA: Diagnosis not present

## 2018-12-30 DIAGNOSIS — D509 Iron deficiency anemia, unspecified: Secondary | ICD-10-CM | POA: Diagnosis not present

## 2018-12-30 DIAGNOSIS — N2581 Secondary hyperparathyroidism of renal origin: Secondary | ICD-10-CM | POA: Diagnosis not present

## 2018-12-30 DIAGNOSIS — Z992 Dependence on renal dialysis: Secondary | ICD-10-CM | POA: Diagnosis not present

## 2018-12-31 DIAGNOSIS — N2581 Secondary hyperparathyroidism of renal origin: Secondary | ICD-10-CM | POA: Diagnosis not present

## 2018-12-31 DIAGNOSIS — D509 Iron deficiency anemia, unspecified: Secondary | ICD-10-CM | POA: Diagnosis not present

## 2018-12-31 DIAGNOSIS — N186 End stage renal disease: Secondary | ICD-10-CM | POA: Diagnosis not present

## 2018-12-31 DIAGNOSIS — Z992 Dependence on renal dialysis: Secondary | ICD-10-CM | POA: Diagnosis not present

## 2018-12-31 DIAGNOSIS — D631 Anemia in chronic kidney disease: Secondary | ICD-10-CM | POA: Diagnosis not present

## 2019-01-01 DIAGNOSIS — D631 Anemia in chronic kidney disease: Secondary | ICD-10-CM | POA: Diagnosis not present

## 2019-01-01 DIAGNOSIS — N186 End stage renal disease: Secondary | ICD-10-CM | POA: Diagnosis not present

## 2019-01-01 DIAGNOSIS — N400 Enlarged prostate without lower urinary tract symptoms: Secondary | ICD-10-CM | POA: Diagnosis not present

## 2019-01-01 DIAGNOSIS — E1165 Type 2 diabetes mellitus with hyperglycemia: Secondary | ICD-10-CM | POA: Diagnosis not present

## 2019-01-01 DIAGNOSIS — Z992 Dependence on renal dialysis: Secondary | ICD-10-CM | POA: Diagnosis not present

## 2019-01-01 DIAGNOSIS — D509 Iron deficiency anemia, unspecified: Secondary | ICD-10-CM | POA: Diagnosis not present

## 2019-01-01 DIAGNOSIS — N2581 Secondary hyperparathyroidism of renal origin: Secondary | ICD-10-CM | POA: Diagnosis not present

## 2019-01-01 DIAGNOSIS — I10 Essential (primary) hypertension: Secondary | ICD-10-CM | POA: Diagnosis not present

## 2019-01-02 DIAGNOSIS — Z992 Dependence on renal dialysis: Secondary | ICD-10-CM | POA: Diagnosis not present

## 2019-01-02 DIAGNOSIS — N2581 Secondary hyperparathyroidism of renal origin: Secondary | ICD-10-CM | POA: Diagnosis not present

## 2019-01-02 DIAGNOSIS — I10 Essential (primary) hypertension: Secondary | ICD-10-CM | POA: Diagnosis not present

## 2019-01-02 DIAGNOSIS — Z1389 Encounter for screening for other disorder: Secondary | ICD-10-CM | POA: Diagnosis not present

## 2019-01-02 DIAGNOSIS — Z1331 Encounter for screening for depression: Secondary | ICD-10-CM | POA: Diagnosis not present

## 2019-01-02 DIAGNOSIS — D631 Anemia in chronic kidney disease: Secondary | ICD-10-CM | POA: Diagnosis not present

## 2019-01-02 DIAGNOSIS — N186 End stage renal disease: Secondary | ICD-10-CM | POA: Diagnosis not present

## 2019-01-02 DIAGNOSIS — E1165 Type 2 diabetes mellitus with hyperglycemia: Secondary | ICD-10-CM | POA: Diagnosis not present

## 2019-01-02 DIAGNOSIS — D509 Iron deficiency anemia, unspecified: Secondary | ICD-10-CM | POA: Diagnosis not present

## 2019-01-03 DIAGNOSIS — D631 Anemia in chronic kidney disease: Secondary | ICD-10-CM | POA: Diagnosis not present

## 2019-01-03 DIAGNOSIS — N2581 Secondary hyperparathyroidism of renal origin: Secondary | ICD-10-CM | POA: Diagnosis not present

## 2019-01-03 DIAGNOSIS — D509 Iron deficiency anemia, unspecified: Secondary | ICD-10-CM | POA: Diagnosis not present

## 2019-01-03 DIAGNOSIS — Z992 Dependence on renal dialysis: Secondary | ICD-10-CM | POA: Diagnosis not present

## 2019-01-03 DIAGNOSIS — N186 End stage renal disease: Secondary | ICD-10-CM | POA: Diagnosis not present

## 2019-01-04 DIAGNOSIS — N186 End stage renal disease: Secondary | ICD-10-CM | POA: Diagnosis not present

## 2019-01-04 DIAGNOSIS — N2581 Secondary hyperparathyroidism of renal origin: Secondary | ICD-10-CM | POA: Diagnosis not present

## 2019-01-04 DIAGNOSIS — D631 Anemia in chronic kidney disease: Secondary | ICD-10-CM | POA: Diagnosis not present

## 2019-01-04 DIAGNOSIS — Z992 Dependence on renal dialysis: Secondary | ICD-10-CM | POA: Diagnosis not present

## 2019-01-04 DIAGNOSIS — D509 Iron deficiency anemia, unspecified: Secondary | ICD-10-CM | POA: Diagnosis not present

## 2019-01-05 DIAGNOSIS — N186 End stage renal disease: Secondary | ICD-10-CM | POA: Diagnosis not present

## 2019-01-05 DIAGNOSIS — Z992 Dependence on renal dialysis: Secondary | ICD-10-CM | POA: Diagnosis not present

## 2019-01-05 DIAGNOSIS — D631 Anemia in chronic kidney disease: Secondary | ICD-10-CM | POA: Diagnosis not present

## 2019-01-05 DIAGNOSIS — D509 Iron deficiency anemia, unspecified: Secondary | ICD-10-CM | POA: Diagnosis not present

## 2019-01-05 DIAGNOSIS — N2581 Secondary hyperparathyroidism of renal origin: Secondary | ICD-10-CM | POA: Diagnosis not present

## 2019-01-06 DIAGNOSIS — Z992 Dependence on renal dialysis: Secondary | ICD-10-CM | POA: Diagnosis not present

## 2019-01-06 DIAGNOSIS — N2581 Secondary hyperparathyroidism of renal origin: Secondary | ICD-10-CM | POA: Diagnosis not present

## 2019-01-06 DIAGNOSIS — D631 Anemia in chronic kidney disease: Secondary | ICD-10-CM | POA: Diagnosis not present

## 2019-01-06 DIAGNOSIS — N186 End stage renal disease: Secondary | ICD-10-CM | POA: Diagnosis not present

## 2019-01-06 DIAGNOSIS — D509 Iron deficiency anemia, unspecified: Secondary | ICD-10-CM | POA: Diagnosis not present

## 2019-01-07 DIAGNOSIS — D631 Anemia in chronic kidney disease: Secondary | ICD-10-CM | POA: Diagnosis not present

## 2019-01-07 DIAGNOSIS — N186 End stage renal disease: Secondary | ICD-10-CM | POA: Diagnosis not present

## 2019-01-07 DIAGNOSIS — D509 Iron deficiency anemia, unspecified: Secondary | ICD-10-CM | POA: Diagnosis not present

## 2019-01-07 DIAGNOSIS — N2581 Secondary hyperparathyroidism of renal origin: Secondary | ICD-10-CM | POA: Diagnosis not present

## 2019-01-07 DIAGNOSIS — Z992 Dependence on renal dialysis: Secondary | ICD-10-CM | POA: Diagnosis not present

## 2019-01-08 DIAGNOSIS — N2581 Secondary hyperparathyroidism of renal origin: Secondary | ICD-10-CM | POA: Diagnosis not present

## 2019-01-08 DIAGNOSIS — D631 Anemia in chronic kidney disease: Secondary | ICD-10-CM | POA: Diagnosis not present

## 2019-01-08 DIAGNOSIS — N186 End stage renal disease: Secondary | ICD-10-CM | POA: Diagnosis not present

## 2019-01-08 DIAGNOSIS — Z992 Dependence on renal dialysis: Secondary | ICD-10-CM | POA: Diagnosis not present

## 2019-01-08 DIAGNOSIS — D509 Iron deficiency anemia, unspecified: Secondary | ICD-10-CM | POA: Diagnosis not present

## 2019-01-09 DIAGNOSIS — Z992 Dependence on renal dialysis: Secondary | ICD-10-CM | POA: Diagnosis not present

## 2019-01-09 DIAGNOSIS — D631 Anemia in chronic kidney disease: Secondary | ICD-10-CM | POA: Diagnosis not present

## 2019-01-09 DIAGNOSIS — N186 End stage renal disease: Secondary | ICD-10-CM | POA: Diagnosis not present

## 2019-01-09 DIAGNOSIS — D509 Iron deficiency anemia, unspecified: Secondary | ICD-10-CM | POA: Diagnosis not present

## 2019-01-09 DIAGNOSIS — N2581 Secondary hyperparathyroidism of renal origin: Secondary | ICD-10-CM | POA: Diagnosis not present

## 2019-01-10 DIAGNOSIS — N186 End stage renal disease: Secondary | ICD-10-CM | POA: Diagnosis not present

## 2019-01-10 DIAGNOSIS — Z992 Dependence on renal dialysis: Secondary | ICD-10-CM | POA: Diagnosis not present

## 2019-01-10 DIAGNOSIS — N2581 Secondary hyperparathyroidism of renal origin: Secondary | ICD-10-CM | POA: Diagnosis not present

## 2019-01-10 DIAGNOSIS — D509 Iron deficiency anemia, unspecified: Secondary | ICD-10-CM | POA: Diagnosis not present

## 2019-01-10 DIAGNOSIS — D631 Anemia in chronic kidney disease: Secondary | ICD-10-CM | POA: Diagnosis not present

## 2019-01-11 DIAGNOSIS — N186 End stage renal disease: Secondary | ICD-10-CM | POA: Diagnosis not present

## 2019-01-11 DIAGNOSIS — Z992 Dependence on renal dialysis: Secondary | ICD-10-CM | POA: Diagnosis not present

## 2019-01-11 DIAGNOSIS — D631 Anemia in chronic kidney disease: Secondary | ICD-10-CM | POA: Diagnosis not present

## 2019-01-11 DIAGNOSIS — N2581 Secondary hyperparathyroidism of renal origin: Secondary | ICD-10-CM | POA: Diagnosis not present

## 2019-01-11 DIAGNOSIS — D509 Iron deficiency anemia, unspecified: Secondary | ICD-10-CM | POA: Diagnosis not present

## 2019-01-12 DIAGNOSIS — D509 Iron deficiency anemia, unspecified: Secondary | ICD-10-CM | POA: Diagnosis not present

## 2019-01-12 DIAGNOSIS — D631 Anemia in chronic kidney disease: Secondary | ICD-10-CM | POA: Diagnosis not present

## 2019-01-12 DIAGNOSIS — Z992 Dependence on renal dialysis: Secondary | ICD-10-CM | POA: Diagnosis not present

## 2019-01-12 DIAGNOSIS — N2581 Secondary hyperparathyroidism of renal origin: Secondary | ICD-10-CM | POA: Diagnosis not present

## 2019-01-12 DIAGNOSIS — N186 End stage renal disease: Secondary | ICD-10-CM | POA: Diagnosis not present

## 2019-01-13 DIAGNOSIS — D631 Anemia in chronic kidney disease: Secondary | ICD-10-CM | POA: Diagnosis not present

## 2019-01-13 DIAGNOSIS — E1165 Type 2 diabetes mellitus with hyperglycemia: Secondary | ICD-10-CM | POA: Diagnosis not present

## 2019-01-13 DIAGNOSIS — Z992 Dependence on renal dialysis: Secondary | ICD-10-CM | POA: Diagnosis not present

## 2019-01-13 DIAGNOSIS — N186 End stage renal disease: Secondary | ICD-10-CM | POA: Diagnosis not present

## 2019-01-13 DIAGNOSIS — N2581 Secondary hyperparathyroidism of renal origin: Secondary | ICD-10-CM | POA: Diagnosis not present

## 2019-01-13 DIAGNOSIS — D509 Iron deficiency anemia, unspecified: Secondary | ICD-10-CM | POA: Diagnosis not present

## 2019-01-13 DIAGNOSIS — I10 Essential (primary) hypertension: Secondary | ICD-10-CM | POA: Diagnosis not present

## 2019-01-14 DIAGNOSIS — N2581 Secondary hyperparathyroidism of renal origin: Secondary | ICD-10-CM | POA: Diagnosis not present

## 2019-01-14 DIAGNOSIS — Z992 Dependence on renal dialysis: Secondary | ICD-10-CM | POA: Diagnosis not present

## 2019-01-14 DIAGNOSIS — D509 Iron deficiency anemia, unspecified: Secondary | ICD-10-CM | POA: Diagnosis not present

## 2019-01-14 DIAGNOSIS — D631 Anemia in chronic kidney disease: Secondary | ICD-10-CM | POA: Diagnosis not present

## 2019-01-14 DIAGNOSIS — N186 End stage renal disease: Secondary | ICD-10-CM | POA: Diagnosis not present

## 2019-01-15 DIAGNOSIS — D509 Iron deficiency anemia, unspecified: Secondary | ICD-10-CM | POA: Diagnosis not present

## 2019-01-15 DIAGNOSIS — N186 End stage renal disease: Secondary | ICD-10-CM | POA: Diagnosis not present

## 2019-01-15 DIAGNOSIS — Z992 Dependence on renal dialysis: Secondary | ICD-10-CM | POA: Diagnosis not present

## 2019-01-15 DIAGNOSIS — N2581 Secondary hyperparathyroidism of renal origin: Secondary | ICD-10-CM | POA: Diagnosis not present

## 2019-01-15 DIAGNOSIS — D631 Anemia in chronic kidney disease: Secondary | ICD-10-CM | POA: Diagnosis not present

## 2019-01-16 DIAGNOSIS — N186 End stage renal disease: Secondary | ICD-10-CM | POA: Diagnosis not present

## 2019-01-16 DIAGNOSIS — D631 Anemia in chronic kidney disease: Secondary | ICD-10-CM | POA: Diagnosis not present

## 2019-01-16 DIAGNOSIS — N2581 Secondary hyperparathyroidism of renal origin: Secondary | ICD-10-CM | POA: Diagnosis not present

## 2019-01-16 DIAGNOSIS — Z992 Dependence on renal dialysis: Secondary | ICD-10-CM | POA: Diagnosis not present

## 2019-01-16 DIAGNOSIS — D509 Iron deficiency anemia, unspecified: Secondary | ICD-10-CM | POA: Diagnosis not present

## 2019-01-17 DIAGNOSIS — N2581 Secondary hyperparathyroidism of renal origin: Secondary | ICD-10-CM | POA: Diagnosis not present

## 2019-01-17 DIAGNOSIS — D631 Anemia in chronic kidney disease: Secondary | ICD-10-CM | POA: Diagnosis not present

## 2019-01-17 DIAGNOSIS — N186 End stage renal disease: Secondary | ICD-10-CM | POA: Diagnosis not present

## 2019-01-17 DIAGNOSIS — Z992 Dependence on renal dialysis: Secondary | ICD-10-CM | POA: Diagnosis not present

## 2019-01-17 DIAGNOSIS — D509 Iron deficiency anemia, unspecified: Secondary | ICD-10-CM | POA: Diagnosis not present

## 2019-01-18 DIAGNOSIS — D509 Iron deficiency anemia, unspecified: Secondary | ICD-10-CM | POA: Diagnosis not present

## 2019-01-18 DIAGNOSIS — Z992 Dependence on renal dialysis: Secondary | ICD-10-CM | POA: Diagnosis not present

## 2019-01-18 DIAGNOSIS — N2581 Secondary hyperparathyroidism of renal origin: Secondary | ICD-10-CM | POA: Diagnosis not present

## 2019-01-18 DIAGNOSIS — N186 End stage renal disease: Secondary | ICD-10-CM | POA: Diagnosis not present

## 2019-01-18 DIAGNOSIS — D631 Anemia in chronic kidney disease: Secondary | ICD-10-CM | POA: Diagnosis not present

## 2019-01-19 DIAGNOSIS — Z992 Dependence on renal dialysis: Secondary | ICD-10-CM | POA: Diagnosis not present

## 2019-01-19 DIAGNOSIS — N2581 Secondary hyperparathyroidism of renal origin: Secondary | ICD-10-CM | POA: Diagnosis not present

## 2019-01-19 DIAGNOSIS — N186 End stage renal disease: Secondary | ICD-10-CM | POA: Diagnosis not present

## 2019-01-19 DIAGNOSIS — D631 Anemia in chronic kidney disease: Secondary | ICD-10-CM | POA: Diagnosis not present

## 2019-01-19 DIAGNOSIS — D509 Iron deficiency anemia, unspecified: Secondary | ICD-10-CM | POA: Diagnosis not present

## 2019-01-20 DIAGNOSIS — D631 Anemia in chronic kidney disease: Secondary | ICD-10-CM | POA: Diagnosis not present

## 2019-01-20 DIAGNOSIS — N2581 Secondary hyperparathyroidism of renal origin: Secondary | ICD-10-CM | POA: Diagnosis not present

## 2019-01-20 DIAGNOSIS — D509 Iron deficiency anemia, unspecified: Secondary | ICD-10-CM | POA: Diagnosis not present

## 2019-01-20 DIAGNOSIS — E119 Type 2 diabetes mellitus without complications: Secondary | ICD-10-CM | POA: Diagnosis not present

## 2019-01-20 DIAGNOSIS — N186 End stage renal disease: Secondary | ICD-10-CM | POA: Diagnosis not present

## 2019-01-20 DIAGNOSIS — Z794 Long term (current) use of insulin: Secondary | ICD-10-CM | POA: Diagnosis not present

## 2019-01-20 DIAGNOSIS — Z992 Dependence on renal dialysis: Secondary | ICD-10-CM | POA: Diagnosis not present

## 2019-01-21 DIAGNOSIS — N186 End stage renal disease: Secondary | ICD-10-CM | POA: Diagnosis not present

## 2019-01-21 DIAGNOSIS — D631 Anemia in chronic kidney disease: Secondary | ICD-10-CM | POA: Diagnosis not present

## 2019-01-21 DIAGNOSIS — Z992 Dependence on renal dialysis: Secondary | ICD-10-CM | POA: Diagnosis not present

## 2019-01-21 DIAGNOSIS — N2581 Secondary hyperparathyroidism of renal origin: Secondary | ICD-10-CM | POA: Diagnosis not present

## 2019-01-21 DIAGNOSIS — D509 Iron deficiency anemia, unspecified: Secondary | ICD-10-CM | POA: Diagnosis not present

## 2019-01-22 DIAGNOSIS — Z992 Dependence on renal dialysis: Secondary | ICD-10-CM | POA: Diagnosis not present

## 2019-01-22 DIAGNOSIS — D631 Anemia in chronic kidney disease: Secondary | ICD-10-CM | POA: Diagnosis not present

## 2019-01-22 DIAGNOSIS — N186 End stage renal disease: Secondary | ICD-10-CM | POA: Diagnosis not present

## 2019-01-22 DIAGNOSIS — N2581 Secondary hyperparathyroidism of renal origin: Secondary | ICD-10-CM | POA: Diagnosis not present

## 2019-01-22 DIAGNOSIS — D509 Iron deficiency anemia, unspecified: Secondary | ICD-10-CM | POA: Diagnosis not present

## 2019-01-23 DIAGNOSIS — N2581 Secondary hyperparathyroidism of renal origin: Secondary | ICD-10-CM | POA: Diagnosis not present

## 2019-01-23 DIAGNOSIS — N186 End stage renal disease: Secondary | ICD-10-CM | POA: Diagnosis not present

## 2019-01-23 DIAGNOSIS — Z992 Dependence on renal dialysis: Secondary | ICD-10-CM | POA: Diagnosis not present

## 2019-01-23 DIAGNOSIS — D631 Anemia in chronic kidney disease: Secondary | ICD-10-CM | POA: Diagnosis not present

## 2019-01-23 DIAGNOSIS — D509 Iron deficiency anemia, unspecified: Secondary | ICD-10-CM | POA: Diagnosis not present

## 2019-01-24 DIAGNOSIS — D509 Iron deficiency anemia, unspecified: Secondary | ICD-10-CM | POA: Diagnosis not present

## 2019-01-24 DIAGNOSIS — D631 Anemia in chronic kidney disease: Secondary | ICD-10-CM | POA: Diagnosis not present

## 2019-01-24 DIAGNOSIS — N2581 Secondary hyperparathyroidism of renal origin: Secondary | ICD-10-CM | POA: Diagnosis not present

## 2019-01-24 DIAGNOSIS — N186 End stage renal disease: Secondary | ICD-10-CM | POA: Diagnosis not present

## 2019-01-24 DIAGNOSIS — Z992 Dependence on renal dialysis: Secondary | ICD-10-CM | POA: Diagnosis not present

## 2019-01-25 DIAGNOSIS — D509 Iron deficiency anemia, unspecified: Secondary | ICD-10-CM | POA: Diagnosis not present

## 2019-01-25 DIAGNOSIS — Z992 Dependence on renal dialysis: Secondary | ICD-10-CM | POA: Diagnosis not present

## 2019-01-25 DIAGNOSIS — N2581 Secondary hyperparathyroidism of renal origin: Secondary | ICD-10-CM | POA: Diagnosis not present

## 2019-01-25 DIAGNOSIS — N186 End stage renal disease: Secondary | ICD-10-CM | POA: Diagnosis not present

## 2019-01-25 DIAGNOSIS — D631 Anemia in chronic kidney disease: Secondary | ICD-10-CM | POA: Diagnosis not present

## 2019-01-26 DIAGNOSIS — D509 Iron deficiency anemia, unspecified: Secondary | ICD-10-CM | POA: Diagnosis not present

## 2019-01-26 DIAGNOSIS — N2581 Secondary hyperparathyroidism of renal origin: Secondary | ICD-10-CM | POA: Diagnosis not present

## 2019-01-26 DIAGNOSIS — N186 End stage renal disease: Secondary | ICD-10-CM | POA: Diagnosis not present

## 2019-01-26 DIAGNOSIS — D631 Anemia in chronic kidney disease: Secondary | ICD-10-CM | POA: Diagnosis not present

## 2019-01-26 DIAGNOSIS — Z992 Dependence on renal dialysis: Secondary | ICD-10-CM | POA: Diagnosis not present

## 2019-01-27 DIAGNOSIS — D631 Anemia in chronic kidney disease: Secondary | ICD-10-CM | POA: Diagnosis not present

## 2019-01-27 DIAGNOSIS — N2581 Secondary hyperparathyroidism of renal origin: Secondary | ICD-10-CM | POA: Diagnosis not present

## 2019-01-27 DIAGNOSIS — N186 End stage renal disease: Secondary | ICD-10-CM | POA: Diagnosis not present

## 2019-01-27 DIAGNOSIS — Z992 Dependence on renal dialysis: Secondary | ICD-10-CM | POA: Diagnosis not present

## 2019-01-27 DIAGNOSIS — D509 Iron deficiency anemia, unspecified: Secondary | ICD-10-CM | POA: Diagnosis not present

## 2019-01-28 DIAGNOSIS — N2581 Secondary hyperparathyroidism of renal origin: Secondary | ICD-10-CM | POA: Diagnosis not present

## 2019-01-28 DIAGNOSIS — Z992 Dependence on renal dialysis: Secondary | ICD-10-CM | POA: Diagnosis not present

## 2019-01-28 DIAGNOSIS — D631 Anemia in chronic kidney disease: Secondary | ICD-10-CM | POA: Diagnosis not present

## 2019-01-28 DIAGNOSIS — N186 End stage renal disease: Secondary | ICD-10-CM | POA: Diagnosis not present

## 2019-01-28 DIAGNOSIS — D509 Iron deficiency anemia, unspecified: Secondary | ICD-10-CM | POA: Diagnosis not present

## 2019-01-29 DIAGNOSIS — Z992 Dependence on renal dialysis: Secondary | ICD-10-CM | POA: Diagnosis not present

## 2019-01-29 DIAGNOSIS — D509 Iron deficiency anemia, unspecified: Secondary | ICD-10-CM | POA: Diagnosis not present

## 2019-01-29 DIAGNOSIS — N186 End stage renal disease: Secondary | ICD-10-CM | POA: Diagnosis not present

## 2019-01-29 DIAGNOSIS — D631 Anemia in chronic kidney disease: Secondary | ICD-10-CM | POA: Diagnosis not present

## 2019-01-29 DIAGNOSIS — N2581 Secondary hyperparathyroidism of renal origin: Secondary | ICD-10-CM | POA: Diagnosis not present

## 2019-01-30 DIAGNOSIS — D631 Anemia in chronic kidney disease: Secondary | ICD-10-CM | POA: Diagnosis not present

## 2019-01-30 DIAGNOSIS — Z992 Dependence on renal dialysis: Secondary | ICD-10-CM | POA: Diagnosis not present

## 2019-01-30 DIAGNOSIS — D509 Iron deficiency anemia, unspecified: Secondary | ICD-10-CM | POA: Diagnosis not present

## 2019-01-30 DIAGNOSIS — N2581 Secondary hyperparathyroidism of renal origin: Secondary | ICD-10-CM | POA: Diagnosis not present

## 2019-01-30 DIAGNOSIS — N186 End stage renal disease: Secondary | ICD-10-CM | POA: Diagnosis not present

## 2019-01-31 DIAGNOSIS — D509 Iron deficiency anemia, unspecified: Secondary | ICD-10-CM | POA: Diagnosis not present

## 2019-01-31 DIAGNOSIS — D631 Anemia in chronic kidney disease: Secondary | ICD-10-CM | POA: Diagnosis not present

## 2019-01-31 DIAGNOSIS — N186 End stage renal disease: Secondary | ICD-10-CM | POA: Diagnosis not present

## 2019-01-31 DIAGNOSIS — Z992 Dependence on renal dialysis: Secondary | ICD-10-CM | POA: Diagnosis not present

## 2019-01-31 DIAGNOSIS — N2581 Secondary hyperparathyroidism of renal origin: Secondary | ICD-10-CM | POA: Diagnosis not present

## 2019-02-01 DIAGNOSIS — D509 Iron deficiency anemia, unspecified: Secondary | ICD-10-CM | POA: Diagnosis not present

## 2019-02-01 DIAGNOSIS — D631 Anemia in chronic kidney disease: Secondary | ICD-10-CM | POA: Diagnosis not present

## 2019-02-01 DIAGNOSIS — N2581 Secondary hyperparathyroidism of renal origin: Secondary | ICD-10-CM | POA: Diagnosis not present

## 2019-02-01 DIAGNOSIS — Z992 Dependence on renal dialysis: Secondary | ICD-10-CM | POA: Diagnosis not present

## 2019-02-01 DIAGNOSIS — N186 End stage renal disease: Secondary | ICD-10-CM | POA: Diagnosis not present

## 2019-02-02 DIAGNOSIS — Z992 Dependence on renal dialysis: Secondary | ICD-10-CM | POA: Diagnosis not present

## 2019-02-02 DIAGNOSIS — N2581 Secondary hyperparathyroidism of renal origin: Secondary | ICD-10-CM | POA: Diagnosis not present

## 2019-02-02 DIAGNOSIS — D631 Anemia in chronic kidney disease: Secondary | ICD-10-CM | POA: Diagnosis not present

## 2019-02-02 DIAGNOSIS — D509 Iron deficiency anemia, unspecified: Secondary | ICD-10-CM | POA: Diagnosis not present

## 2019-02-02 DIAGNOSIS — N186 End stage renal disease: Secondary | ICD-10-CM | POA: Diagnosis not present

## 2019-02-03 DIAGNOSIS — N2581 Secondary hyperparathyroidism of renal origin: Secondary | ICD-10-CM | POA: Diagnosis not present

## 2019-02-03 DIAGNOSIS — N186 End stage renal disease: Secondary | ICD-10-CM | POA: Diagnosis not present

## 2019-02-03 DIAGNOSIS — D509 Iron deficiency anemia, unspecified: Secondary | ICD-10-CM | POA: Diagnosis not present

## 2019-02-03 DIAGNOSIS — D631 Anemia in chronic kidney disease: Secondary | ICD-10-CM | POA: Diagnosis not present

## 2019-02-03 DIAGNOSIS — Z992 Dependence on renal dialysis: Secondary | ICD-10-CM | POA: Diagnosis not present

## 2019-02-04 DIAGNOSIS — D509 Iron deficiency anemia, unspecified: Secondary | ICD-10-CM | POA: Diagnosis not present

## 2019-02-04 DIAGNOSIS — D631 Anemia in chronic kidney disease: Secondary | ICD-10-CM | POA: Diagnosis not present

## 2019-02-04 DIAGNOSIS — N2581 Secondary hyperparathyroidism of renal origin: Secondary | ICD-10-CM | POA: Diagnosis not present

## 2019-02-04 DIAGNOSIS — Z992 Dependence on renal dialysis: Secondary | ICD-10-CM | POA: Diagnosis not present

## 2019-02-04 DIAGNOSIS — N186 End stage renal disease: Secondary | ICD-10-CM | POA: Diagnosis not present

## 2019-02-05 DIAGNOSIS — N186 End stage renal disease: Secondary | ICD-10-CM | POA: Diagnosis not present

## 2019-02-05 DIAGNOSIS — D631 Anemia in chronic kidney disease: Secondary | ICD-10-CM | POA: Diagnosis not present

## 2019-02-05 DIAGNOSIS — Z992 Dependence on renal dialysis: Secondary | ICD-10-CM | POA: Diagnosis not present

## 2019-02-05 DIAGNOSIS — D509 Iron deficiency anemia, unspecified: Secondary | ICD-10-CM | POA: Diagnosis not present

## 2019-02-05 DIAGNOSIS — N2581 Secondary hyperparathyroidism of renal origin: Secondary | ICD-10-CM | POA: Diagnosis not present

## 2019-02-06 DIAGNOSIS — D509 Iron deficiency anemia, unspecified: Secondary | ICD-10-CM | POA: Diagnosis not present

## 2019-02-06 DIAGNOSIS — D631 Anemia in chronic kidney disease: Secondary | ICD-10-CM | POA: Diagnosis not present

## 2019-02-06 DIAGNOSIS — N2581 Secondary hyperparathyroidism of renal origin: Secondary | ICD-10-CM | POA: Diagnosis not present

## 2019-02-06 DIAGNOSIS — Z992 Dependence on renal dialysis: Secondary | ICD-10-CM | POA: Diagnosis not present

## 2019-02-06 DIAGNOSIS — N186 End stage renal disease: Secondary | ICD-10-CM | POA: Diagnosis not present

## 2019-02-07 DIAGNOSIS — D509 Iron deficiency anemia, unspecified: Secondary | ICD-10-CM | POA: Diagnosis not present

## 2019-02-07 DIAGNOSIS — Z992 Dependence on renal dialysis: Secondary | ICD-10-CM | POA: Diagnosis not present

## 2019-02-07 DIAGNOSIS — D631 Anemia in chronic kidney disease: Secondary | ICD-10-CM | POA: Diagnosis not present

## 2019-02-07 DIAGNOSIS — N186 End stage renal disease: Secondary | ICD-10-CM | POA: Diagnosis not present

## 2019-02-07 DIAGNOSIS — N2581 Secondary hyperparathyroidism of renal origin: Secondary | ICD-10-CM | POA: Diagnosis not present

## 2019-02-08 DIAGNOSIS — D509 Iron deficiency anemia, unspecified: Secondary | ICD-10-CM | POA: Diagnosis not present

## 2019-02-08 DIAGNOSIS — N186 End stage renal disease: Secondary | ICD-10-CM | POA: Diagnosis not present

## 2019-02-08 DIAGNOSIS — N2581 Secondary hyperparathyroidism of renal origin: Secondary | ICD-10-CM | POA: Diagnosis not present

## 2019-02-08 DIAGNOSIS — Z992 Dependence on renal dialysis: Secondary | ICD-10-CM | POA: Diagnosis not present

## 2019-02-08 DIAGNOSIS — D631 Anemia in chronic kidney disease: Secondary | ICD-10-CM | POA: Diagnosis not present

## 2019-02-09 DIAGNOSIS — D631 Anemia in chronic kidney disease: Secondary | ICD-10-CM | POA: Diagnosis not present

## 2019-02-09 DIAGNOSIS — D509 Iron deficiency anemia, unspecified: Secondary | ICD-10-CM | POA: Diagnosis not present

## 2019-02-09 DIAGNOSIS — N186 End stage renal disease: Secondary | ICD-10-CM | POA: Diagnosis not present

## 2019-02-09 DIAGNOSIS — N2581 Secondary hyperparathyroidism of renal origin: Secondary | ICD-10-CM | POA: Diagnosis not present

## 2019-02-09 DIAGNOSIS — Z992 Dependence on renal dialysis: Secondary | ICD-10-CM | POA: Diagnosis not present

## 2019-02-10 DIAGNOSIS — Z992 Dependence on renal dialysis: Secondary | ICD-10-CM | POA: Diagnosis not present

## 2019-02-10 DIAGNOSIS — D509 Iron deficiency anemia, unspecified: Secondary | ICD-10-CM | POA: Diagnosis not present

## 2019-02-10 DIAGNOSIS — N186 End stage renal disease: Secondary | ICD-10-CM | POA: Diagnosis not present

## 2019-02-10 DIAGNOSIS — D631 Anemia in chronic kidney disease: Secondary | ICD-10-CM | POA: Diagnosis not present

## 2019-02-10 DIAGNOSIS — N2581 Secondary hyperparathyroidism of renal origin: Secondary | ICD-10-CM | POA: Diagnosis not present

## 2019-02-11 DIAGNOSIS — D631 Anemia in chronic kidney disease: Secondary | ICD-10-CM | POA: Diagnosis not present

## 2019-02-11 DIAGNOSIS — N186 End stage renal disease: Secondary | ICD-10-CM | POA: Diagnosis not present

## 2019-02-11 DIAGNOSIS — N2581 Secondary hyperparathyroidism of renal origin: Secondary | ICD-10-CM | POA: Diagnosis not present

## 2019-02-11 DIAGNOSIS — Z992 Dependence on renal dialysis: Secondary | ICD-10-CM | POA: Diagnosis not present

## 2019-02-11 DIAGNOSIS — D509 Iron deficiency anemia, unspecified: Secondary | ICD-10-CM | POA: Diagnosis not present

## 2019-02-12 DIAGNOSIS — D509 Iron deficiency anemia, unspecified: Secondary | ICD-10-CM | POA: Diagnosis not present

## 2019-02-12 DIAGNOSIS — N2581 Secondary hyperparathyroidism of renal origin: Secondary | ICD-10-CM | POA: Diagnosis not present

## 2019-02-12 DIAGNOSIS — N186 End stage renal disease: Secondary | ICD-10-CM | POA: Diagnosis not present

## 2019-02-12 DIAGNOSIS — D631 Anemia in chronic kidney disease: Secondary | ICD-10-CM | POA: Diagnosis not present

## 2019-02-12 DIAGNOSIS — Z992 Dependence on renal dialysis: Secondary | ICD-10-CM | POA: Diagnosis not present

## 2019-02-13 DIAGNOSIS — N186 End stage renal disease: Secondary | ICD-10-CM | POA: Diagnosis not present

## 2019-02-13 DIAGNOSIS — D631 Anemia in chronic kidney disease: Secondary | ICD-10-CM | POA: Diagnosis not present

## 2019-02-13 DIAGNOSIS — Z992 Dependence on renal dialysis: Secondary | ICD-10-CM | POA: Diagnosis not present

## 2019-02-13 DIAGNOSIS — N2581 Secondary hyperparathyroidism of renal origin: Secondary | ICD-10-CM | POA: Diagnosis not present

## 2019-02-13 DIAGNOSIS — D509 Iron deficiency anemia, unspecified: Secondary | ICD-10-CM | POA: Diagnosis not present

## 2019-02-14 DIAGNOSIS — Z992 Dependence on renal dialysis: Secondary | ICD-10-CM | POA: Diagnosis not present

## 2019-02-14 DIAGNOSIS — N2581 Secondary hyperparathyroidism of renal origin: Secondary | ICD-10-CM | POA: Diagnosis not present

## 2019-02-14 DIAGNOSIS — D631 Anemia in chronic kidney disease: Secondary | ICD-10-CM | POA: Diagnosis not present

## 2019-02-14 DIAGNOSIS — D509 Iron deficiency anemia, unspecified: Secondary | ICD-10-CM | POA: Diagnosis not present

## 2019-02-14 DIAGNOSIS — N186 End stage renal disease: Secondary | ICD-10-CM | POA: Diagnosis not present

## 2019-02-15 DIAGNOSIS — D509 Iron deficiency anemia, unspecified: Secondary | ICD-10-CM | POA: Diagnosis not present

## 2019-02-15 DIAGNOSIS — Z992 Dependence on renal dialysis: Secondary | ICD-10-CM | POA: Diagnosis not present

## 2019-02-15 DIAGNOSIS — N186 End stage renal disease: Secondary | ICD-10-CM | POA: Diagnosis not present

## 2019-02-15 DIAGNOSIS — D631 Anemia in chronic kidney disease: Secondary | ICD-10-CM | POA: Diagnosis not present

## 2019-02-16 DIAGNOSIS — D509 Iron deficiency anemia, unspecified: Secondary | ICD-10-CM | POA: Diagnosis not present

## 2019-02-16 DIAGNOSIS — N186 End stage renal disease: Secondary | ICD-10-CM | POA: Diagnosis not present

## 2019-02-16 DIAGNOSIS — Z992 Dependence on renal dialysis: Secondary | ICD-10-CM | POA: Diagnosis not present

## 2019-02-16 DIAGNOSIS — D631 Anemia in chronic kidney disease: Secondary | ICD-10-CM | POA: Diagnosis not present

## 2019-02-17 DIAGNOSIS — Z992 Dependence on renal dialysis: Secondary | ICD-10-CM | POA: Diagnosis not present

## 2019-02-17 DIAGNOSIS — N186 End stage renal disease: Secondary | ICD-10-CM | POA: Diagnosis not present

## 2019-02-17 DIAGNOSIS — D631 Anemia in chronic kidney disease: Secondary | ICD-10-CM | POA: Diagnosis not present

## 2019-02-17 DIAGNOSIS — D509 Iron deficiency anemia, unspecified: Secondary | ICD-10-CM | POA: Diagnosis not present

## 2019-02-18 DIAGNOSIS — D631 Anemia in chronic kidney disease: Secondary | ICD-10-CM | POA: Diagnosis not present

## 2019-02-18 DIAGNOSIS — N186 End stage renal disease: Secondary | ICD-10-CM | POA: Diagnosis not present

## 2019-02-18 DIAGNOSIS — Z992 Dependence on renal dialysis: Secondary | ICD-10-CM | POA: Diagnosis not present

## 2019-02-18 DIAGNOSIS — D509 Iron deficiency anemia, unspecified: Secondary | ICD-10-CM | POA: Diagnosis not present

## 2019-02-19 DIAGNOSIS — D631 Anemia in chronic kidney disease: Secondary | ICD-10-CM | POA: Diagnosis not present

## 2019-02-19 DIAGNOSIS — Z992 Dependence on renal dialysis: Secondary | ICD-10-CM | POA: Diagnosis not present

## 2019-02-19 DIAGNOSIS — N186 End stage renal disease: Secondary | ICD-10-CM | POA: Diagnosis not present

## 2019-02-19 DIAGNOSIS — D509 Iron deficiency anemia, unspecified: Secondary | ICD-10-CM | POA: Diagnosis not present

## 2019-02-20 DIAGNOSIS — D631 Anemia in chronic kidney disease: Secondary | ICD-10-CM | POA: Diagnosis not present

## 2019-02-20 DIAGNOSIS — N186 End stage renal disease: Secondary | ICD-10-CM | POA: Diagnosis not present

## 2019-02-20 DIAGNOSIS — Z992 Dependence on renal dialysis: Secondary | ICD-10-CM | POA: Diagnosis not present

## 2019-02-20 DIAGNOSIS — D509 Iron deficiency anemia, unspecified: Secondary | ICD-10-CM | POA: Diagnosis not present

## 2019-02-21 ENCOUNTER — Other Ambulatory Visit: Payer: Self-pay | Admitting: "Endocrinology

## 2019-02-21 DIAGNOSIS — N186 End stage renal disease: Secondary | ICD-10-CM | POA: Diagnosis not present

## 2019-02-21 DIAGNOSIS — D509 Iron deficiency anemia, unspecified: Secondary | ICD-10-CM | POA: Diagnosis not present

## 2019-02-21 DIAGNOSIS — D631 Anemia in chronic kidney disease: Secondary | ICD-10-CM | POA: Diagnosis not present

## 2019-02-21 DIAGNOSIS — Z992 Dependence on renal dialysis: Secondary | ICD-10-CM | POA: Diagnosis not present

## 2019-02-22 DIAGNOSIS — Z992 Dependence on renal dialysis: Secondary | ICD-10-CM | POA: Diagnosis not present

## 2019-02-22 DIAGNOSIS — D509 Iron deficiency anemia, unspecified: Secondary | ICD-10-CM | POA: Diagnosis not present

## 2019-02-22 DIAGNOSIS — D631 Anemia in chronic kidney disease: Secondary | ICD-10-CM | POA: Diagnosis not present

## 2019-02-22 DIAGNOSIS — N186 End stage renal disease: Secondary | ICD-10-CM | POA: Diagnosis not present

## 2019-02-23 DIAGNOSIS — D509 Iron deficiency anemia, unspecified: Secondary | ICD-10-CM | POA: Diagnosis not present

## 2019-02-23 DIAGNOSIS — Z992 Dependence on renal dialysis: Secondary | ICD-10-CM | POA: Diagnosis not present

## 2019-02-23 DIAGNOSIS — N186 End stage renal disease: Secondary | ICD-10-CM | POA: Diagnosis not present

## 2019-02-23 DIAGNOSIS — D631 Anemia in chronic kidney disease: Secondary | ICD-10-CM | POA: Diagnosis not present

## 2019-02-24 DIAGNOSIS — N186 End stage renal disease: Secondary | ICD-10-CM | POA: Diagnosis not present

## 2019-02-24 DIAGNOSIS — Z992 Dependence on renal dialysis: Secondary | ICD-10-CM | POA: Diagnosis not present

## 2019-02-24 DIAGNOSIS — D631 Anemia in chronic kidney disease: Secondary | ICD-10-CM | POA: Diagnosis not present

## 2019-02-24 DIAGNOSIS — D509 Iron deficiency anemia, unspecified: Secondary | ICD-10-CM | POA: Diagnosis not present

## 2019-02-25 DIAGNOSIS — N186 End stage renal disease: Secondary | ICD-10-CM | POA: Diagnosis not present

## 2019-02-25 DIAGNOSIS — Z992 Dependence on renal dialysis: Secondary | ICD-10-CM | POA: Diagnosis not present

## 2019-02-25 DIAGNOSIS — D631 Anemia in chronic kidney disease: Secondary | ICD-10-CM | POA: Diagnosis not present

## 2019-02-25 DIAGNOSIS — D509 Iron deficiency anemia, unspecified: Secondary | ICD-10-CM | POA: Diagnosis not present

## 2019-02-26 DIAGNOSIS — Z992 Dependence on renal dialysis: Secondary | ICD-10-CM | POA: Diagnosis not present

## 2019-02-26 DIAGNOSIS — D631 Anemia in chronic kidney disease: Secondary | ICD-10-CM | POA: Diagnosis not present

## 2019-02-26 DIAGNOSIS — N186 End stage renal disease: Secondary | ICD-10-CM | POA: Diagnosis not present

## 2019-02-26 DIAGNOSIS — D509 Iron deficiency anemia, unspecified: Secondary | ICD-10-CM | POA: Diagnosis not present

## 2019-02-27 DIAGNOSIS — D509 Iron deficiency anemia, unspecified: Secondary | ICD-10-CM | POA: Diagnosis not present

## 2019-02-27 DIAGNOSIS — Z992 Dependence on renal dialysis: Secondary | ICD-10-CM | POA: Diagnosis not present

## 2019-02-27 DIAGNOSIS — D631 Anemia in chronic kidney disease: Secondary | ICD-10-CM | POA: Diagnosis not present

## 2019-02-27 DIAGNOSIS — N186 End stage renal disease: Secondary | ICD-10-CM | POA: Diagnosis not present

## 2019-02-28 DIAGNOSIS — D509 Iron deficiency anemia, unspecified: Secondary | ICD-10-CM | POA: Diagnosis not present

## 2019-02-28 DIAGNOSIS — N186 End stage renal disease: Secondary | ICD-10-CM | POA: Diagnosis not present

## 2019-02-28 DIAGNOSIS — Z992 Dependence on renal dialysis: Secondary | ICD-10-CM | POA: Diagnosis not present

## 2019-02-28 DIAGNOSIS — D631 Anemia in chronic kidney disease: Secondary | ICD-10-CM | POA: Diagnosis not present

## 2019-03-01 DIAGNOSIS — D631 Anemia in chronic kidney disease: Secondary | ICD-10-CM | POA: Diagnosis not present

## 2019-03-01 DIAGNOSIS — Z992 Dependence on renal dialysis: Secondary | ICD-10-CM | POA: Diagnosis not present

## 2019-03-01 DIAGNOSIS — N186 End stage renal disease: Secondary | ICD-10-CM | POA: Diagnosis not present

## 2019-03-01 DIAGNOSIS — D509 Iron deficiency anemia, unspecified: Secondary | ICD-10-CM | POA: Diagnosis not present

## 2019-03-02 DIAGNOSIS — D631 Anemia in chronic kidney disease: Secondary | ICD-10-CM | POA: Diagnosis not present

## 2019-03-02 DIAGNOSIS — Z992 Dependence on renal dialysis: Secondary | ICD-10-CM | POA: Diagnosis not present

## 2019-03-02 DIAGNOSIS — D509 Iron deficiency anemia, unspecified: Secondary | ICD-10-CM | POA: Diagnosis not present

## 2019-03-02 DIAGNOSIS — N186 End stage renal disease: Secondary | ICD-10-CM | POA: Diagnosis not present

## 2019-03-03 DIAGNOSIS — Z992 Dependence on renal dialysis: Secondary | ICD-10-CM | POA: Diagnosis not present

## 2019-03-03 DIAGNOSIS — D631 Anemia in chronic kidney disease: Secondary | ICD-10-CM | POA: Diagnosis not present

## 2019-03-03 DIAGNOSIS — D509 Iron deficiency anemia, unspecified: Secondary | ICD-10-CM | POA: Diagnosis not present

## 2019-03-03 DIAGNOSIS — N186 End stage renal disease: Secondary | ICD-10-CM | POA: Diagnosis not present

## 2019-03-04 DIAGNOSIS — D509 Iron deficiency anemia, unspecified: Secondary | ICD-10-CM | POA: Diagnosis not present

## 2019-03-04 DIAGNOSIS — Z992 Dependence on renal dialysis: Secondary | ICD-10-CM | POA: Diagnosis not present

## 2019-03-04 DIAGNOSIS — D631 Anemia in chronic kidney disease: Secondary | ICD-10-CM | POA: Diagnosis not present

## 2019-03-04 DIAGNOSIS — N186 End stage renal disease: Secondary | ICD-10-CM | POA: Diagnosis not present

## 2019-03-05 DIAGNOSIS — D509 Iron deficiency anemia, unspecified: Secondary | ICD-10-CM | POA: Diagnosis not present

## 2019-03-05 DIAGNOSIS — N186 End stage renal disease: Secondary | ICD-10-CM | POA: Diagnosis not present

## 2019-03-05 DIAGNOSIS — D631 Anemia in chronic kidney disease: Secondary | ICD-10-CM | POA: Diagnosis not present

## 2019-03-05 DIAGNOSIS — Z992 Dependence on renal dialysis: Secondary | ICD-10-CM | POA: Diagnosis not present

## 2019-03-06 ENCOUNTER — Other Ambulatory Visit: Payer: Self-pay | Admitting: "Endocrinology

## 2019-03-06 DIAGNOSIS — D509 Iron deficiency anemia, unspecified: Secondary | ICD-10-CM | POA: Diagnosis not present

## 2019-03-06 DIAGNOSIS — D631 Anemia in chronic kidney disease: Secondary | ICD-10-CM | POA: Diagnosis not present

## 2019-03-06 DIAGNOSIS — Z992 Dependence on renal dialysis: Secondary | ICD-10-CM | POA: Diagnosis not present

## 2019-03-06 DIAGNOSIS — N186 End stage renal disease: Secondary | ICD-10-CM | POA: Diagnosis not present

## 2019-03-07 DIAGNOSIS — N186 End stage renal disease: Secondary | ICD-10-CM | POA: Diagnosis not present

## 2019-03-07 DIAGNOSIS — Z992 Dependence on renal dialysis: Secondary | ICD-10-CM | POA: Diagnosis not present

## 2019-03-07 DIAGNOSIS — D631 Anemia in chronic kidney disease: Secondary | ICD-10-CM | POA: Diagnosis not present

## 2019-03-07 DIAGNOSIS — D509 Iron deficiency anemia, unspecified: Secondary | ICD-10-CM | POA: Diagnosis not present

## 2019-03-08 DIAGNOSIS — N186 End stage renal disease: Secondary | ICD-10-CM | POA: Diagnosis not present

## 2019-03-08 DIAGNOSIS — Z992 Dependence on renal dialysis: Secondary | ICD-10-CM | POA: Diagnosis not present

## 2019-03-08 DIAGNOSIS — D509 Iron deficiency anemia, unspecified: Secondary | ICD-10-CM | POA: Diagnosis not present

## 2019-03-08 DIAGNOSIS — D631 Anemia in chronic kidney disease: Secondary | ICD-10-CM | POA: Diagnosis not present

## 2019-03-09 DIAGNOSIS — D509 Iron deficiency anemia, unspecified: Secondary | ICD-10-CM | POA: Diagnosis not present

## 2019-03-09 DIAGNOSIS — N186 End stage renal disease: Secondary | ICD-10-CM | POA: Diagnosis not present

## 2019-03-09 DIAGNOSIS — D631 Anemia in chronic kidney disease: Secondary | ICD-10-CM | POA: Diagnosis not present

## 2019-03-09 DIAGNOSIS — Z992 Dependence on renal dialysis: Secondary | ICD-10-CM | POA: Diagnosis not present

## 2019-03-10 DIAGNOSIS — D509 Iron deficiency anemia, unspecified: Secondary | ICD-10-CM | POA: Diagnosis not present

## 2019-03-10 DIAGNOSIS — Z992 Dependence on renal dialysis: Secondary | ICD-10-CM | POA: Diagnosis not present

## 2019-03-10 DIAGNOSIS — N186 End stage renal disease: Secondary | ICD-10-CM | POA: Diagnosis not present

## 2019-03-10 DIAGNOSIS — D631 Anemia in chronic kidney disease: Secondary | ICD-10-CM | POA: Diagnosis not present

## 2019-03-11 DIAGNOSIS — D509 Iron deficiency anemia, unspecified: Secondary | ICD-10-CM | POA: Diagnosis not present

## 2019-03-11 DIAGNOSIS — Z992 Dependence on renal dialysis: Secondary | ICD-10-CM | POA: Diagnosis not present

## 2019-03-11 DIAGNOSIS — D631 Anemia in chronic kidney disease: Secondary | ICD-10-CM | POA: Diagnosis not present

## 2019-03-11 DIAGNOSIS — N186 End stage renal disease: Secondary | ICD-10-CM | POA: Diagnosis not present

## 2019-03-12 DIAGNOSIS — D509 Iron deficiency anemia, unspecified: Secondary | ICD-10-CM | POA: Diagnosis not present

## 2019-03-12 DIAGNOSIS — Z992 Dependence on renal dialysis: Secondary | ICD-10-CM | POA: Diagnosis not present

## 2019-03-12 DIAGNOSIS — D631 Anemia in chronic kidney disease: Secondary | ICD-10-CM | POA: Diagnosis not present

## 2019-03-12 DIAGNOSIS — N186 End stage renal disease: Secondary | ICD-10-CM | POA: Diagnosis not present

## 2019-03-13 DIAGNOSIS — D631 Anemia in chronic kidney disease: Secondary | ICD-10-CM | POA: Diagnosis not present

## 2019-03-13 DIAGNOSIS — D509 Iron deficiency anemia, unspecified: Secondary | ICD-10-CM | POA: Diagnosis not present

## 2019-03-13 DIAGNOSIS — Z992 Dependence on renal dialysis: Secondary | ICD-10-CM | POA: Diagnosis not present

## 2019-03-13 DIAGNOSIS — N186 End stage renal disease: Secondary | ICD-10-CM | POA: Diagnosis not present

## 2019-03-14 DIAGNOSIS — N186 End stage renal disease: Secondary | ICD-10-CM | POA: Diagnosis not present

## 2019-03-14 DIAGNOSIS — D509 Iron deficiency anemia, unspecified: Secondary | ICD-10-CM | POA: Diagnosis not present

## 2019-03-14 DIAGNOSIS — Z992 Dependence on renal dialysis: Secondary | ICD-10-CM | POA: Diagnosis not present

## 2019-03-14 DIAGNOSIS — D631 Anemia in chronic kidney disease: Secondary | ICD-10-CM | POA: Diagnosis not present

## 2019-03-15 DIAGNOSIS — N186 End stage renal disease: Secondary | ICD-10-CM | POA: Diagnosis not present

## 2019-03-15 DIAGNOSIS — D509 Iron deficiency anemia, unspecified: Secondary | ICD-10-CM | POA: Diagnosis not present

## 2019-03-15 DIAGNOSIS — D631 Anemia in chronic kidney disease: Secondary | ICD-10-CM | POA: Diagnosis not present

## 2019-03-15 DIAGNOSIS — Z992 Dependence on renal dialysis: Secondary | ICD-10-CM | POA: Diagnosis not present

## 2019-03-16 DIAGNOSIS — N186 End stage renal disease: Secondary | ICD-10-CM | POA: Diagnosis not present

## 2019-03-16 DIAGNOSIS — D509 Iron deficiency anemia, unspecified: Secondary | ICD-10-CM | POA: Diagnosis not present

## 2019-03-16 DIAGNOSIS — D631 Anemia in chronic kidney disease: Secondary | ICD-10-CM | POA: Diagnosis not present

## 2019-03-16 DIAGNOSIS — Z992 Dependence on renal dialysis: Secondary | ICD-10-CM | POA: Diagnosis not present

## 2019-03-17 DIAGNOSIS — Z992 Dependence on renal dialysis: Secondary | ICD-10-CM | POA: Diagnosis not present

## 2019-03-17 DIAGNOSIS — D631 Anemia in chronic kidney disease: Secondary | ICD-10-CM | POA: Diagnosis not present

## 2019-03-17 DIAGNOSIS — D509 Iron deficiency anemia, unspecified: Secondary | ICD-10-CM | POA: Diagnosis not present

## 2019-03-17 DIAGNOSIS — N186 End stage renal disease: Secondary | ICD-10-CM | POA: Diagnosis not present

## 2019-03-18 DIAGNOSIS — N186 End stage renal disease: Secondary | ICD-10-CM | POA: Diagnosis not present

## 2019-03-18 DIAGNOSIS — D509 Iron deficiency anemia, unspecified: Secondary | ICD-10-CM | POA: Diagnosis not present

## 2019-03-18 DIAGNOSIS — Z992 Dependence on renal dialysis: Secondary | ICD-10-CM | POA: Diagnosis not present

## 2019-03-18 DIAGNOSIS — D631 Anemia in chronic kidney disease: Secondary | ICD-10-CM | POA: Diagnosis not present

## 2019-03-19 DIAGNOSIS — N186 End stage renal disease: Secondary | ICD-10-CM | POA: Diagnosis not present

## 2019-03-19 DIAGNOSIS — Z992 Dependence on renal dialysis: Secondary | ICD-10-CM | POA: Diagnosis not present

## 2019-03-19 DIAGNOSIS — D631 Anemia in chronic kidney disease: Secondary | ICD-10-CM | POA: Diagnosis not present

## 2019-03-19 DIAGNOSIS — D509 Iron deficiency anemia, unspecified: Secondary | ICD-10-CM | POA: Diagnosis not present

## 2019-03-20 DIAGNOSIS — Z992 Dependence on renal dialysis: Secondary | ICD-10-CM | POA: Diagnosis not present

## 2019-03-20 DIAGNOSIS — D631 Anemia in chronic kidney disease: Secondary | ICD-10-CM | POA: Diagnosis not present

## 2019-03-20 DIAGNOSIS — N186 End stage renal disease: Secondary | ICD-10-CM | POA: Diagnosis not present

## 2019-03-20 DIAGNOSIS — D509 Iron deficiency anemia, unspecified: Secondary | ICD-10-CM | POA: Diagnosis not present

## 2019-03-21 DIAGNOSIS — D509 Iron deficiency anemia, unspecified: Secondary | ICD-10-CM | POA: Diagnosis not present

## 2019-03-21 DIAGNOSIS — Z992 Dependence on renal dialysis: Secondary | ICD-10-CM | POA: Diagnosis not present

## 2019-03-21 DIAGNOSIS — N186 End stage renal disease: Secondary | ICD-10-CM | POA: Diagnosis not present

## 2019-03-21 DIAGNOSIS — D631 Anemia in chronic kidney disease: Secondary | ICD-10-CM | POA: Diagnosis not present

## 2019-03-22 DIAGNOSIS — Z992 Dependence on renal dialysis: Secondary | ICD-10-CM | POA: Diagnosis not present

## 2019-03-22 DIAGNOSIS — D509 Iron deficiency anemia, unspecified: Secondary | ICD-10-CM | POA: Diagnosis not present

## 2019-03-22 DIAGNOSIS — N186 End stage renal disease: Secondary | ICD-10-CM | POA: Diagnosis not present

## 2019-03-22 DIAGNOSIS — D631 Anemia in chronic kidney disease: Secondary | ICD-10-CM | POA: Diagnosis not present

## 2019-03-23 DIAGNOSIS — D509 Iron deficiency anemia, unspecified: Secondary | ICD-10-CM | POA: Diagnosis not present

## 2019-03-23 DIAGNOSIS — N186 End stage renal disease: Secondary | ICD-10-CM | POA: Diagnosis not present

## 2019-03-23 DIAGNOSIS — Z992 Dependence on renal dialysis: Secondary | ICD-10-CM | POA: Diagnosis not present

## 2019-03-23 DIAGNOSIS — D631 Anemia in chronic kidney disease: Secondary | ICD-10-CM | POA: Diagnosis not present

## 2019-03-24 DIAGNOSIS — D631 Anemia in chronic kidney disease: Secondary | ICD-10-CM | POA: Diagnosis not present

## 2019-03-24 DIAGNOSIS — N186 End stage renal disease: Secondary | ICD-10-CM | POA: Diagnosis not present

## 2019-03-24 DIAGNOSIS — D509 Iron deficiency anemia, unspecified: Secondary | ICD-10-CM | POA: Diagnosis not present

## 2019-03-24 DIAGNOSIS — Z992 Dependence on renal dialysis: Secondary | ICD-10-CM | POA: Diagnosis not present

## 2019-03-25 DIAGNOSIS — D509 Iron deficiency anemia, unspecified: Secondary | ICD-10-CM | POA: Diagnosis not present

## 2019-03-25 DIAGNOSIS — N186 End stage renal disease: Secondary | ICD-10-CM | POA: Diagnosis not present

## 2019-03-25 DIAGNOSIS — D631 Anemia in chronic kidney disease: Secondary | ICD-10-CM | POA: Diagnosis not present

## 2019-03-25 DIAGNOSIS — Z992 Dependence on renal dialysis: Secondary | ICD-10-CM | POA: Diagnosis not present

## 2019-03-26 DIAGNOSIS — N186 End stage renal disease: Secondary | ICD-10-CM | POA: Diagnosis not present

## 2019-03-26 DIAGNOSIS — D509 Iron deficiency anemia, unspecified: Secondary | ICD-10-CM | POA: Diagnosis not present

## 2019-03-26 DIAGNOSIS — D631 Anemia in chronic kidney disease: Secondary | ICD-10-CM | POA: Diagnosis not present

## 2019-03-26 DIAGNOSIS — Z992 Dependence on renal dialysis: Secondary | ICD-10-CM | POA: Diagnosis not present

## 2019-03-27 DIAGNOSIS — D509 Iron deficiency anemia, unspecified: Secondary | ICD-10-CM | POA: Diagnosis not present

## 2019-03-27 DIAGNOSIS — N186 End stage renal disease: Secondary | ICD-10-CM | POA: Diagnosis not present

## 2019-03-27 DIAGNOSIS — D631 Anemia in chronic kidney disease: Secondary | ICD-10-CM | POA: Diagnosis not present

## 2019-03-27 DIAGNOSIS — Z992 Dependence on renal dialysis: Secondary | ICD-10-CM | POA: Diagnosis not present

## 2019-03-28 DIAGNOSIS — D631 Anemia in chronic kidney disease: Secondary | ICD-10-CM | POA: Diagnosis not present

## 2019-03-28 DIAGNOSIS — N186 End stage renal disease: Secondary | ICD-10-CM | POA: Diagnosis not present

## 2019-03-28 DIAGNOSIS — Z992 Dependence on renal dialysis: Secondary | ICD-10-CM | POA: Diagnosis not present

## 2019-03-28 DIAGNOSIS — D509 Iron deficiency anemia, unspecified: Secondary | ICD-10-CM | POA: Diagnosis not present

## 2019-03-29 DIAGNOSIS — D631 Anemia in chronic kidney disease: Secondary | ICD-10-CM | POA: Diagnosis not present

## 2019-03-29 DIAGNOSIS — N186 End stage renal disease: Secondary | ICD-10-CM | POA: Diagnosis not present

## 2019-03-29 DIAGNOSIS — D509 Iron deficiency anemia, unspecified: Secondary | ICD-10-CM | POA: Diagnosis not present

## 2019-03-29 DIAGNOSIS — Z992 Dependence on renal dialysis: Secondary | ICD-10-CM | POA: Diagnosis not present

## 2019-03-30 DIAGNOSIS — N186 End stage renal disease: Secondary | ICD-10-CM | POA: Diagnosis not present

## 2019-03-30 DIAGNOSIS — Z992 Dependence on renal dialysis: Secondary | ICD-10-CM | POA: Diagnosis not present

## 2019-03-30 DIAGNOSIS — D631 Anemia in chronic kidney disease: Secondary | ICD-10-CM | POA: Diagnosis not present

## 2019-03-30 DIAGNOSIS — D509 Iron deficiency anemia, unspecified: Secondary | ICD-10-CM | POA: Diagnosis not present

## 2019-03-31 DIAGNOSIS — D509 Iron deficiency anemia, unspecified: Secondary | ICD-10-CM | POA: Diagnosis not present

## 2019-03-31 DIAGNOSIS — D631 Anemia in chronic kidney disease: Secondary | ICD-10-CM | POA: Diagnosis not present

## 2019-03-31 DIAGNOSIS — Z992 Dependence on renal dialysis: Secondary | ICD-10-CM | POA: Diagnosis not present

## 2019-03-31 DIAGNOSIS — N186 End stage renal disease: Secondary | ICD-10-CM | POA: Diagnosis not present

## 2019-04-01 DIAGNOSIS — E1165 Type 2 diabetes mellitus with hyperglycemia: Secondary | ICD-10-CM | POA: Diagnosis not present

## 2019-04-01 DIAGNOSIS — N186 End stage renal disease: Secondary | ICD-10-CM | POA: Diagnosis not present

## 2019-04-01 DIAGNOSIS — Z992 Dependence on renal dialysis: Secondary | ICD-10-CM | POA: Diagnosis not present

## 2019-04-01 DIAGNOSIS — Z23 Encounter for immunization: Secondary | ICD-10-CM | POA: Diagnosis not present

## 2019-04-01 DIAGNOSIS — D509 Iron deficiency anemia, unspecified: Secondary | ICD-10-CM | POA: Diagnosis not present

## 2019-04-01 DIAGNOSIS — I10 Essential (primary) hypertension: Secondary | ICD-10-CM | POA: Diagnosis not present

## 2019-04-01 DIAGNOSIS — D631 Anemia in chronic kidney disease: Secondary | ICD-10-CM | POA: Diagnosis not present

## 2019-04-02 DIAGNOSIS — D631 Anemia in chronic kidney disease: Secondary | ICD-10-CM | POA: Diagnosis not present

## 2019-04-02 DIAGNOSIS — D509 Iron deficiency anemia, unspecified: Secondary | ICD-10-CM | POA: Diagnosis not present

## 2019-04-02 DIAGNOSIS — Z992 Dependence on renal dialysis: Secondary | ICD-10-CM | POA: Diagnosis not present

## 2019-04-02 DIAGNOSIS — N186 End stage renal disease: Secondary | ICD-10-CM | POA: Diagnosis not present

## 2019-04-03 DIAGNOSIS — Z992 Dependence on renal dialysis: Secondary | ICD-10-CM | POA: Diagnosis not present

## 2019-04-03 DIAGNOSIS — D509 Iron deficiency anemia, unspecified: Secondary | ICD-10-CM | POA: Diagnosis not present

## 2019-04-03 DIAGNOSIS — N186 End stage renal disease: Secondary | ICD-10-CM | POA: Diagnosis not present

## 2019-04-03 DIAGNOSIS — D631 Anemia in chronic kidney disease: Secondary | ICD-10-CM | POA: Diagnosis not present

## 2019-04-04 DIAGNOSIS — Z992 Dependence on renal dialysis: Secondary | ICD-10-CM | POA: Diagnosis not present

## 2019-04-04 DIAGNOSIS — N186 End stage renal disease: Secondary | ICD-10-CM | POA: Diagnosis not present

## 2019-04-04 DIAGNOSIS — D509 Iron deficiency anemia, unspecified: Secondary | ICD-10-CM | POA: Diagnosis not present

## 2019-04-04 DIAGNOSIS — D631 Anemia in chronic kidney disease: Secondary | ICD-10-CM | POA: Diagnosis not present

## 2019-04-05 DIAGNOSIS — D631 Anemia in chronic kidney disease: Secondary | ICD-10-CM | POA: Diagnosis not present

## 2019-04-05 DIAGNOSIS — Z992 Dependence on renal dialysis: Secondary | ICD-10-CM | POA: Diagnosis not present

## 2019-04-05 DIAGNOSIS — D509 Iron deficiency anemia, unspecified: Secondary | ICD-10-CM | POA: Diagnosis not present

## 2019-04-05 DIAGNOSIS — N186 End stage renal disease: Secondary | ICD-10-CM | POA: Diagnosis not present

## 2019-04-06 DIAGNOSIS — D631 Anemia in chronic kidney disease: Secondary | ICD-10-CM | POA: Diagnosis not present

## 2019-04-06 DIAGNOSIS — N186 End stage renal disease: Secondary | ICD-10-CM | POA: Diagnosis not present

## 2019-04-06 DIAGNOSIS — Z992 Dependence on renal dialysis: Secondary | ICD-10-CM | POA: Diagnosis not present

## 2019-04-06 DIAGNOSIS — D509 Iron deficiency anemia, unspecified: Secondary | ICD-10-CM | POA: Diagnosis not present

## 2019-04-07 DIAGNOSIS — D509 Iron deficiency anemia, unspecified: Secondary | ICD-10-CM | POA: Diagnosis not present

## 2019-04-07 DIAGNOSIS — D631 Anemia in chronic kidney disease: Secondary | ICD-10-CM | POA: Diagnosis not present

## 2019-04-07 DIAGNOSIS — Z992 Dependence on renal dialysis: Secondary | ICD-10-CM | POA: Diagnosis not present

## 2019-04-07 DIAGNOSIS — N186 End stage renal disease: Secondary | ICD-10-CM | POA: Diagnosis not present

## 2019-04-08 DIAGNOSIS — D509 Iron deficiency anemia, unspecified: Secondary | ICD-10-CM | POA: Diagnosis not present

## 2019-04-08 DIAGNOSIS — Z992 Dependence on renal dialysis: Secondary | ICD-10-CM | POA: Diagnosis not present

## 2019-04-08 DIAGNOSIS — N186 End stage renal disease: Secondary | ICD-10-CM | POA: Diagnosis not present

## 2019-04-08 DIAGNOSIS — D631 Anemia in chronic kidney disease: Secondary | ICD-10-CM | POA: Diagnosis not present

## 2019-04-09 DIAGNOSIS — D509 Iron deficiency anemia, unspecified: Secondary | ICD-10-CM | POA: Diagnosis not present

## 2019-04-09 DIAGNOSIS — N185 Chronic kidney disease, stage 5: Secondary | ICD-10-CM | POA: Diagnosis not present

## 2019-04-09 DIAGNOSIS — N186 End stage renal disease: Secondary | ICD-10-CM | POA: Diagnosis not present

## 2019-04-09 DIAGNOSIS — Z992 Dependence on renal dialysis: Secondary | ICD-10-CM | POA: Diagnosis not present

## 2019-04-09 DIAGNOSIS — D631 Anemia in chronic kidney disease: Secondary | ICD-10-CM | POA: Diagnosis not present

## 2019-04-09 DIAGNOSIS — E1122 Type 2 diabetes mellitus with diabetic chronic kidney disease: Secondary | ICD-10-CM | POA: Diagnosis not present

## 2019-04-10 DIAGNOSIS — N186 End stage renal disease: Secondary | ICD-10-CM | POA: Diagnosis not present

## 2019-04-10 DIAGNOSIS — D631 Anemia in chronic kidney disease: Secondary | ICD-10-CM | POA: Diagnosis not present

## 2019-04-10 DIAGNOSIS — Z992 Dependence on renal dialysis: Secondary | ICD-10-CM | POA: Diagnosis not present

## 2019-04-10 DIAGNOSIS — D509 Iron deficiency anemia, unspecified: Secondary | ICD-10-CM | POA: Diagnosis not present

## 2019-04-10 LAB — COMPLETE METABOLIC PANEL WITH GFR
AG Ratio: 1.1 (calc) (ref 1.0–2.5)
ALT: 27 U/L (ref 9–46)
AST: 19 U/L (ref 10–35)
Albumin: 3.3 g/dL — ABNORMAL LOW (ref 3.6–5.1)
Alkaline phosphatase (APISO): 57 U/L (ref 35–144)
BUN/Creatinine Ratio: 5 (calc) — ABNORMAL LOW (ref 6–22)
BUN: 34 mg/dL — ABNORMAL HIGH (ref 7–25)
CO2: 33 mmol/L — ABNORMAL HIGH (ref 20–32)
Calcium: 8.9 mg/dL (ref 8.6–10.3)
Chloride: 97 mmol/L — ABNORMAL LOW (ref 98–110)
Creat: 7.52 mg/dL — ABNORMAL HIGH (ref 0.70–1.25)
GFR, Est African American: 8 mL/min/{1.73_m2} — ABNORMAL LOW (ref >=60)
GFR, Est Non African American: 7 mL/min/{1.73_m2} — ABNORMAL LOW (ref >=60)
Globulin: 2.9 g/dL (calc) (ref 1.9–3.7)
Glucose, Bld: 116 mg/dL (ref 65–139)
Potassium: 3.5 mmol/L (ref 3.5–5.3)
Sodium: 139 mmol/L (ref 135–146)
Total Bilirubin: 0.4 mg/dL (ref 0.2–1.2)
Total Protein: 6.2 g/dL (ref 6.1–8.1)

## 2019-04-10 LAB — HEMOGLOBIN A1C
Hgb A1c MFr Bld: 6.6 % of total Hgb — ABNORMAL HIGH (ref <5.7)
Mean Plasma Glucose: 143 (calc)
eAG (mmol/L): 7.9 (calc)

## 2019-04-11 DIAGNOSIS — N186 End stage renal disease: Secondary | ICD-10-CM | POA: Diagnosis not present

## 2019-04-11 DIAGNOSIS — Z992 Dependence on renal dialysis: Secondary | ICD-10-CM | POA: Diagnosis not present

## 2019-04-11 DIAGNOSIS — D631 Anemia in chronic kidney disease: Secondary | ICD-10-CM | POA: Diagnosis not present

## 2019-04-11 DIAGNOSIS — D509 Iron deficiency anemia, unspecified: Secondary | ICD-10-CM | POA: Diagnosis not present

## 2019-04-12 DIAGNOSIS — Z992 Dependence on renal dialysis: Secondary | ICD-10-CM | POA: Diagnosis not present

## 2019-04-12 DIAGNOSIS — D509 Iron deficiency anemia, unspecified: Secondary | ICD-10-CM | POA: Diagnosis not present

## 2019-04-12 DIAGNOSIS — N186 End stage renal disease: Secondary | ICD-10-CM | POA: Diagnosis not present

## 2019-04-12 DIAGNOSIS — D631 Anemia in chronic kidney disease: Secondary | ICD-10-CM | POA: Diagnosis not present

## 2019-04-13 DIAGNOSIS — D509 Iron deficiency anemia, unspecified: Secondary | ICD-10-CM | POA: Diagnosis not present

## 2019-04-13 DIAGNOSIS — D631 Anemia in chronic kidney disease: Secondary | ICD-10-CM | POA: Diagnosis not present

## 2019-04-13 DIAGNOSIS — Z992 Dependence on renal dialysis: Secondary | ICD-10-CM | POA: Diagnosis not present

## 2019-04-13 DIAGNOSIS — N186 End stage renal disease: Secondary | ICD-10-CM | POA: Diagnosis not present

## 2019-04-14 ENCOUNTER — Ambulatory Visit (INDEPENDENT_AMBULATORY_CARE_PROVIDER_SITE_OTHER): Payer: Medicare Other | Admitting: "Endocrinology

## 2019-04-14 ENCOUNTER — Other Ambulatory Visit: Payer: Self-pay

## 2019-04-14 ENCOUNTER — Encounter: Payer: Self-pay | Admitting: "Endocrinology

## 2019-04-14 DIAGNOSIS — E1122 Type 2 diabetes mellitus with diabetic chronic kidney disease: Secondary | ICD-10-CM

## 2019-04-14 DIAGNOSIS — E782 Mixed hyperlipidemia: Secondary | ICD-10-CM | POA: Diagnosis not present

## 2019-04-14 DIAGNOSIS — I2510 Atherosclerotic heart disease of native coronary artery without angina pectoris: Secondary | ICD-10-CM | POA: Diagnosis not present

## 2019-04-14 DIAGNOSIS — Z992 Dependence on renal dialysis: Secondary | ICD-10-CM | POA: Diagnosis not present

## 2019-04-14 DIAGNOSIS — I10 Essential (primary) hypertension: Secondary | ICD-10-CM | POA: Diagnosis not present

## 2019-04-14 DIAGNOSIS — D509 Iron deficiency anemia, unspecified: Secondary | ICD-10-CM | POA: Diagnosis not present

## 2019-04-14 DIAGNOSIS — D631 Anemia in chronic kidney disease: Secondary | ICD-10-CM | POA: Diagnosis not present

## 2019-04-14 DIAGNOSIS — N186 End stage renal disease: Secondary | ICD-10-CM | POA: Diagnosis not present

## 2019-04-14 DIAGNOSIS — N185 Chronic kidney disease, stage 5: Secondary | ICD-10-CM

## 2019-04-14 NOTE — Progress Notes (Signed)
04/14/2019                                                    Endocrinology Telehealth Visit Follow up Note -During COVID -19 Pandemic  This visit type was conducted due to national recommendations for restrictions regarding the COVID-19 Pandemic  in an effort to limit this patient's exposure and mitigate transmission of the corona virus.  Due to his co-morbid illnesses, [name] [name] is at  moderate to high risk for complications without adequate follow up.  This format is felt to be most appropriate for him at this time.  I connected with this patient on 04/14/2019   by telephone and verified that I am speaking with the correct person using two identifiers. [name] [name], 08/10/1950. he has verbally consented to this visit. All issues noted in this document were discussed and addressed. The format was not optimal for physical exam.   Subjective:    Patient ID: [name] [name], male    DOB: [birth year], PCP [name] Fire, MD   Past Medical History:  Diagnosis Date  . Anemia   . Arthritis   . Asthma   . Bell palsy   . CAD (coronary artery disease)    a. 2014 MV: abnl w/ infap ischemia; b. 03/2013 Cath: aneurysmal bleb in the LAD w/ otw nonobs dzs-->Med Rx.  . Chronic back pain   . Chronic knee pain    a. 09/2015 s/p R TKA.  Marland Kitchen Chronic pain   . Chronic shoulder pain   . Chronic sinusitis   . CKD (chronic kidney disease), stage IV ([place])    stage 4 per office visit note of Dr Lowanda Foster on 05/2015   . COPD (chronic obstructive pulmonary disease) ([place])   . Diabetes mellitus without complication ([place])    type II   . Essential hypertension   . GERD (gastroesophageal reflux disease)   . Gout   . Gout   . Hepatomegaly    noted on noncontrast CT 2015  . History of hiatal hernia   . Hyperlipidemia   . Lateral meniscus tear   . Obesity    [place]  . Obstructive sleep apnea    does not use cpap   . On home oxygen therapy    uses 2l when is going somewhere per patient   .  PUD (peptic ulcer disease)    remote, reports f/u EGD about 8 years ago unremarkable   . Reactive airway disease    related to exposure to chemical during 9/11  . Renal insufficiency   . Sinusitis   . Vitamin D deficiency    Past Surgical History:  Procedure Laterality Date  . ASAD LT SHOULDER  12/2008   left shoulder  . AV FISTULA PLACEMENT Left 08/09/2016   Procedure: BRACHIOCEPHALIC ARTERIOVENOUS (AV) FISTULA CREATION LEFT ARM;  Surgeon: [name], MD;  Location: [facility];  Service: Vascular;  Laterality: Left;  . CAPD INSERTION N/A 10/07/2018   Procedure: LAPAROSCOPIC PERITONEAL CATHETER PLACEMENT;  Surgeon: Kieth Brightly Arta Bruce, MD;  Location: [facility];  Service: General;  Laterality: N/A;  . CATARACT EXTRACTION W/PHACO Left 03/28/2016   Procedure: CATARACT EXTRACTION PHACO AND INTRAOCULAR LENS PLACEMENT LEFT EYE;  Surgeon: [name], MD;  Location: [facility];  Service:  Ophthalmology;  Laterality: Left;  CDE: 4.77  . CATARACT EXTRACTION W/PHACO Right 04/11/2016   Procedure: CATARACT EXTRACTION PHACO AND INTRAOCULAR LENS PLACEMENT RIGHT EYE; CDE:  4.74;  Surgeon: [name], MD;  Location: [facility];  Service: Ophthalmology;  Laterality: Right;  . COLONOSCOPY  10/2008   Fields: Rectal polyp obliterated, not retrieved, hemorrhoids, single ascending colon diverticulum near the CV. Next colonoscopy April 2020  . COLONOSCOPY N/A 12/25/2014   SLF: 1. Colorectal polyps (2) removed 2. Small internal hemorrhoids 3. the left colon is severely redundant  . DOPPLER ECHOCARDIOGRAPHY    . ESOPHAGOGASTRODUODENOSCOPY N/A 12/25/2014   SLF: 1. Anemia most likely due to CRI, gastritis, gastric polyps 2. Moderate non-erosive gastriits and mild duodenitis.  3.TWo large gstric polyps removed.   Marland Kitchen EYE SURGERY  12/22/2010   tear duct probing-[place]  . FOREIGN BODY REMOVAL  03/29/2011   Procedure: REMOVAL FOREIGN BODY EXTREMITY;  Surgeon: Arther Abbott, MD;  Location: [facility];  Service: Orthopedics;   Laterality: Right;  Removal Foreign Body Right Thumb  . IR FLUORO GUIDE CV LINE RIGHT  08/06/2018  . IR US GUIDE VASC ACCESS RIGHT  08/06/2018  . KNEE ARTHROSCOPY  10/2007   left  . KNEE ARTHROSCOPY WITH LATERAL MENISECTOMY Right 10/14/2015   Procedure: LEFT KNEE ARTHROSCOPY WITH PARTIAL LATERAL MENISECTOMY;  Surgeon: Carole Civil, MD;  Location: [facility];  Service: Orthopedics;  Laterality: Right;  . LEFT HEART CATHETERIZATION WITH CORONARY ANGIOGRAM N/A 03/28/2013   Procedure: LEFT HEART CATHETERIZATION WITH CORONARY ANGIOGRAM;  Surgeon: Leonie Man, MD;  [facility];  Service: Cardiovascular;  Laterality: N/A;  . NM MYOVIEW LTD    . PENILE PROSTHESIS IMPLANT N/A 08/16/2015   Procedure: PENILE PROTHESIS INFLATABLE, three piece, Excisional biopsy of Penile ulcer, Penile molding;  Surgeon: Carolan Clines, MD;  Location: [facility];  Service: Urology;  Laterality: N/A;  . PENILE PROSTHESIS IMPLANT N/A 12/24/2017   Procedure: REMOVAL AND  REPLACEMENT  COLOPLAST PENILE PROSTHESIS;  Surgeon: Lucas Mallow, MD;  Location: [facility];  Service: Urology;  Laterality: N/A;  . QUADRICEPS TENDON REPAIR  07/21/2011   Procedure: REPAIR QUADRICEP TENDON;  Surgeon: Arther Abbott, MD;  Location: [facility];  Service: Orthopedics;  Laterality: Right;  . TOENAIL EXCISION     removed [id number]  . UMBILICAL HERNIA REPAIR  2007   [place]   Social History   Socioeconomic History  . Marital status: Married    Spouse name: Not on file  . Number of children: 2  . Years of education: 12th grade  . Highest education level: Not on file  Occupational History  . Occupation: disabled  . Occupation:      Employer: UNEMPLOYED  Social Needs  . Financial resource strain: Not on file  . Food insecurity    Worry: Not on file    Inability: Not on file  . Transportation needs    Medical: Not on file    Non-medical: Not on file  Tobacco Use  . Smoking status: Former Smoker    Packs/day: 1.00     Years: 25.00    Pack years: 25.00    Types: Cigarettes    Quit date: 03/27/2010    Years since quitting: 9.0  . Smokeless tobacco: Never Used  . Tobacco comment: Quit x 7 years  Substance and Sexual Activity  . Alcohol use: Yes    Alcohol/week: 0.0 standard drinks    Comment: occasionally  . Drug use: Yes    Types:  Marijuana    Comment: cocaine- last time used- 11/24/2017 , marijuana-   . Sexual activity: Not on file  Lifestyle  . Physical activity    Days per week: Not on file    Minutes per session: Not on file  . Stress: Not on file  Relationships  . Social Herbalist on phone: Not on file    Gets together: Not on file    Attends religious service: Not on file    Active member of club or organization: Not on file    Attends meetings of clubs or organizations: Not on file    Relationship status: Not on file  Other Topics Concern  . Not on file  Social History Narrative   He quit smoking in 2010. 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 He developed pulmonary problems, became disabled because of lower airway disease in 2009.       WATCHES BASKETBALL. HIS TEAM IS [place].   Outpatient Encounter Medications as of 04/14/2019  Medication Sig  . allopurinol (ZYLOPRIM) 100 MG tablet Take 200 mg by mouth daily.   Marland Kitchen amLODipine (NORVASC) 10 MG tablet Take 10 mg by mouth daily.  Marland Kitchen aspirin EC 81 MG tablet Take 1 tablet (81 mg total) by mouth daily.  . budesonide-formoterol (SYMBICORT) 160-4.5 MCG/ACT inhaler Inhale 2 puffs into the lungs 2 (two) times daily as needed (Shortness of Breath).   . calcitRIOL (ROCALTROL) 0.25 MCG capsule Take 0.25 mcg by mouth daily.  . cetirizine (ZYRTEC) 10 MG tablet Take 10 mg by mouth at bedtime as needed for allergies.   . DROPLET PEN NEEDLES 31G X 5 MM MISC INJECT SUBCUTANEOUSLY TWICE DAILY AS DIRECTED  . folic acid (FOLVITE) 1 MG tablet Take 1 tablet by mouth daily.  . hydrALAZINE (APRESOLINE) 50 MG  tablet Take 1 tablet (50 mg total) by mouth 3 (three) times daily.  . Insulin Glargine (LANTUS SOLOSTAR) 100 UNIT/ML Solostar Pen INJECT 30 UNITS SUBCUTANEOUSLY AT BEDTIME  . mometasone (NASONEX) 50 MCG/ACT nasal spray Place 2 sprays into the nose 2 (two) times daily as needed (allergies).   . pantoprazole (PROTONIX) 40 MG tablet TAKE 1 TABLET TWICE DAILY 30 MINUTES PRIOR TO MEALS  . simvastatin (ZOCOR) 20 MG tablet Take 20 mg by mouth every morning.   . TRADJENTA 5 MG TABS tablet TAKE 1 TABLET EVERY DAY   No facility-administered encounter medications on file as of 04/14/2019.    ALLERGIES: Allergies  Allergen Reactions  . Tramadol Hcl   . Opana [Oxymorphone Hcl] Itching  . Oxymorphone Itching  . Tramadol Itching   VACCINATION STATUS: Immunization History  Administered Date(s) Administered  . Td 03/27/2011    Diabetes He presents for his follow-up diabetic visit. He has type 2 diabetes mellitus. Onset time: He was diagnosed approximately 20 years ago. His disease course has been improving. There are no hypoglycemic associated symptoms. Pertinent negatives for hypoglycemia include no confusion, headaches, pallor or seizures. Pertinent negatives for diabetes include no chest pain, no fatigue, no polydipsia, no polyphagia, no polyuria and no weakness. There are no hypoglycemic complications. Symptoms are improving. Diabetic complications include nephropathy. (Patient with end-stage renal disease now on hemodialysis.) Risk factors for coronary artery disease include dyslipidemia, diabetes mellitus, hypertension, obesity, sedentary lifestyle, tobacco exposure and male sex. Current diabetic treatment includes insulin injections and oral agent (monotherapy). He is compliant with treatment most of the time. His weight is fluctuating minimally. He is following  a generally unhealthy diet. He has had a previous visit with a dietitian. He rarely participates in exercise. His home blood glucose trend is  decreasing steadily. His breakfast blood glucose range is generally 110-130 mg/dl. His bedtime blood glucose range is generally 130-140 mg/dl. His overall blood glucose range is 130-140 mg/dl. Eye exam is current.  Hypertension This is a chronic problem. The current episode started more than 1 year ago. The problem is controlled. Pertinent negatives include no chest pain, headaches, neck pain, palpitations or shortness of breath. Risk factors for coronary artery disease include diabetes mellitus, dyslipidemia, obesity, smoking/tobacco exposure, sedentary lifestyle and male gender. Past treatments include angiotensin blockers. Hypertensive end-organ damage includes kidney disease.  Hyperlipidemia This is a chronic problem. The current episode started more than 1 year ago. Exacerbating diseases include diabetes and obesity. Pertinent negatives include no chest pain, myalgias or shortness of breath. Current antihyperlipidemic treatment includes statins. Risk factors for coronary artery disease include diabetes mellitus, dyslipidemia, hypertension, male sex and a sedentary lifestyle.    Objective:    There were no vitals taken for this visit.  Wt Readings from Last 3 Encounters:  11/13/18 222 lb 0.1 oz (100.7 kg)  10/07/18 231 lb 7 oz (105 kg)  10/04/18 231 lb 7 oz (105 kg)      Results for orders placed or performed in visit on 12/11/18  Hemoglobin A1c  Result Value Ref Range   Hgb A1c MFr Bld 6.6 (H) <5.7 % of total Hgb   Mean Plasma Glucose 143 (calc)   eAG (mmol/L) 7.9 (calc)  COMPLETE METABOLIC PANEL WITH GFR  Result Value Ref Range   Glucose, Bld 116 65 - 139 mg/dL   BUN 34 (H) 7 - 25 mg/dL   Creat 7.52 (H) 0.70 - 1.25 mg/dL   GFR, Est Non African American 7 (L) > OR = 60 mL/min/1.34m2   GFR, Est African American 8 (L) > OR = 60 mL/min/1.54m2   BUN/Creatinine Ratio 5 (L) 6 - 22 (calc)   Sodium 139 135 - 146 mmol/L   Potassium 3.5 3.5 - 5.3 mmol/L   Chloride 97 (L) 98 - 110 mmol/L    CO2 33 (H) 20 - 32 mmol/L   Calcium 8.9 8.6 - 10.3 mg/dL   Total Protein 6.2 6.1 - 8.1 g/dL   Albumin 3.3 (L) 3.6 - 5.1 g/dL   Globulin 2.9 1.9 - 3.7 g/dL (calc)   AG Ratio 1.1 1.0 - 2.5 (calc)   Total Bilirubin 0.4 0.2 - 1.2 mg/dL   Alkaline phosphatase (APISO) 57 35 - 144 U/L   AST 19 10 - 35 U/L   ALT 27 9 - 46 U/L   Diabetic Labs (most recent): Lab Results  Component Value Date   HGBA1C 6.6 (H) 04/09/2019   HGBA1C 6.9 (H) 12/05/2018   HGBA1C 5.7 (H) 10/04/2018   Lipid Panel     Component Value Date/Time   CHOL 105 09/06/2018 1018   TRIG 69 09/06/2018 1018   HDL 38 (L) 09/06/2018 1018   CHOLHDL 2.8 09/06/2018 1018   VLDL 38 (H) 06/16/2016 0956   LDLCALC 52 09/06/2018 1018     Assessment & Plan:   1. Type 2 diabetes mellitus with stage 4 chronic kidney disease, with long-term current use of insulin (HCC)  -His  Diabetes is complicated by end-stage renal disease recently initiated on hemodialysis,  and patient remains at a high risk for more acute and chronic complications of diabetes which include CAD, CVA, CKD,  retinopathy, and neuropathy. These are all discussed in detail with the patient.  -His reports glycemic profile near target both fasting and postprandial.  His previsit labs show A1c of 6.6%.    Recent labs reviewed. - I have re-counseled the patient on diet management and weight loss  by adopting a carbohydrate restricted / protein rich  Diet.   - he  admits there is a room for improvement in his diet and drink choices. -  Suggestion is made for him to avoid simple carbohydrates  from his diet including Cakes, Sweet Desserts / Pastries, Ice Cream, Soda (diet and regular), Sweet Tea, Candies, Chips, Cookies, Sweet Pastries,  Store Bought Juices, Alcohol in Excess of  1-2 drinks a day, Artificial Sweeteners, Coffee Creamer, and "Sugar-free" Products. This will help patient to have stable blood glucose profile and potentially avoid unintended weight gain.   -  Patient is advised to stick to a routine mealtimes to eat 3 meals  a day and avoid unnecessary snacks ( to snack only to correct hypoglycemia). I have also advised him to cut back on alcohol consumption.   - I have approached patient with the following individualized plan to manage diabetes and patient agrees.   -He is advised to stay away from alcohol and smoking.  -He is advised to continue Lantus 20 units nightly,    associated with monitoring of blood glucose at least 2 times daily-daily before breakfast and at bedtime.   -   He will continue Tradjenta 5 mg p.o. daily at breakfast.  He is following with Jearld Fenton, CDE for DM education.   -He will continue to follow-up with nephrology  given stage 4-5 CKD, he has a dialysis fistula in place.    -Patient is not a candidate for metformin and SGLT2 inhibitors due to CKD.  - Patient specific target  for A1c; LDL, HDL, Triglycerides, and  Waist Circumference were discussed in detail.  2) BP/HTN: he is advised to home monitor blood pressure and report if > 140/90 on 2 separate readings.     His blood pressure is controlled to target.   He is currently on amlodipine and hydralazine for blood pressure control.    3) Lipids/HPL: His recent lipid panel showed controlled LDL at 54.  He is advised to continue simvastatin 20 mg p.o. nightly.   4)  Weight/Diet: He continued to lose weight, lost 15 pounds over the last 6 months.  CDE consult in progress, exercise, and carbohydrates information provided.   5) Chronic Care/Health Maintenance:  -Patient is on ACEI/ARB and Statin medications and encouraged to continue to follow up with Ophthalmology, nephrology (he is status post fistula placement in prep for hemodialysis) Podiatrist at least yearly or according to recommendations, and advised to quit smoking. I have recommended yearly flu vaccine and pneumonia vaccination at least every 5 years; moderate intensity exercise for up to 150 minutes  weekly; and  sleep for at least 7 hours a day. - I advised patient to maintain close follow up with his PCP for primary care needs.      - Patient Care Time Today:  25 min, of which >50% was spent in  counseling and the rest reviewing his  current and  previous labs/studies, previous treatments, his blood glucose readings, and medications' doses and developing a plan for long-term care based on the latest recommendations for standards of care.   [name] [name] participated in the discussions, expressed understanding, and voiced agreement with the above plans.  All questions were answered to his satisfaction. he is encouraged to contact clinic should he have any questions or concerns prior to his return visit.    Follow up plan: -Return in about 6 months (around 10/12/2019) for Bring Meter and Logs- A1c in Office, Include 8 log sheets.  Glade Lloyd, MD Phone: [phone]  Fax: [phone]   This note was partially dictated with voice recognition software. Similar sounding words can be transcribed inadequately or may not  be corrected upon review.  04/14/2019, 5:54 PM

## 2019-04-15 DIAGNOSIS — D631 Anemia in chronic kidney disease: Secondary | ICD-10-CM | POA: Diagnosis not present

## 2019-04-15 DIAGNOSIS — D509 Iron deficiency anemia, unspecified: Secondary | ICD-10-CM | POA: Diagnosis not present

## 2019-04-15 DIAGNOSIS — N186 End stage renal disease: Secondary | ICD-10-CM | POA: Diagnosis not present

## 2019-04-15 DIAGNOSIS — E1142 Type 2 diabetes mellitus with diabetic polyneuropathy: Secondary | ICD-10-CM | POA: Diagnosis not present

## 2019-04-15 DIAGNOSIS — B351 Tinea unguium: Secondary | ICD-10-CM | POA: Diagnosis not present

## 2019-04-15 DIAGNOSIS — Z992 Dependence on renal dialysis: Secondary | ICD-10-CM | POA: Diagnosis not present

## 2019-04-16 DIAGNOSIS — D509 Iron deficiency anemia, unspecified: Secondary | ICD-10-CM | POA: Diagnosis not present

## 2019-04-16 DIAGNOSIS — Z992 Dependence on renal dialysis: Secondary | ICD-10-CM | POA: Diagnosis not present

## 2019-04-16 DIAGNOSIS — D631 Anemia in chronic kidney disease: Secondary | ICD-10-CM | POA: Diagnosis not present

## 2019-04-16 DIAGNOSIS — N186 End stage renal disease: Secondary | ICD-10-CM | POA: Diagnosis not present

## 2019-04-17 DIAGNOSIS — N186 End stage renal disease: Secondary | ICD-10-CM | POA: Diagnosis not present

## 2019-04-17 DIAGNOSIS — D631 Anemia in chronic kidney disease: Secondary | ICD-10-CM | POA: Diagnosis not present

## 2019-04-17 DIAGNOSIS — N2581 Secondary hyperparathyroidism of renal origin: Secondary | ICD-10-CM | POA: Diagnosis not present

## 2019-04-17 DIAGNOSIS — D509 Iron deficiency anemia, unspecified: Secondary | ICD-10-CM | POA: Diagnosis not present

## 2019-04-17 DIAGNOSIS — Z992 Dependence on renal dialysis: Secondary | ICD-10-CM | POA: Diagnosis not present

## 2019-04-18 ENCOUNTER — Encounter (HOSPITAL_COMMUNITY): Payer: Self-pay | Admitting: Emergency Medicine

## 2019-04-18 ENCOUNTER — Emergency Department (HOSPITAL_COMMUNITY)
Admission: EM | Admit: 2019-04-18 | Discharge: 2019-04-18 | Disposition: A | Discharge status: Home / Self Care | Payer: Medicare Other | Attending: Emergency Medicine | Admitting: Emergency Medicine

## 2019-04-18 ENCOUNTER — Other Ambulatory Visit: Payer: Self-pay

## 2019-04-18 DIAGNOSIS — Y929 Unspecified place or not applicable: Secondary | ICD-10-CM | POA: Insufficient documentation

## 2019-04-18 DIAGNOSIS — J019 Acute sinusitis, unspecified: Secondary | ICD-10-CM | POA: Diagnosis not present

## 2019-04-18 DIAGNOSIS — J449 Chronic obstructive pulmonary disease, unspecified: Secondary | ICD-10-CM | POA: Insufficient documentation

## 2019-04-18 DIAGNOSIS — D509 Iron deficiency anemia, unspecified: Secondary | ICD-10-CM | POA: Diagnosis not present

## 2019-04-18 DIAGNOSIS — N186 End stage renal disease: Secondary | ICD-10-CM | POA: Diagnosis not present

## 2019-04-18 DIAGNOSIS — Z87891 Personal history of nicotine dependence: Secondary | ICD-10-CM | POA: Diagnosis not present

## 2019-04-18 DIAGNOSIS — T162XXA Foreign body in left ear, initial encounter: Secondary | ICD-10-CM | POA: Insufficient documentation

## 2019-04-18 DIAGNOSIS — X58XXXA Exposure to other specified factors, initial encounter: Secondary | ICD-10-CM | POA: Insufficient documentation

## 2019-04-18 DIAGNOSIS — I120 Hypertensive chronic kidney disease with stage 5 chronic kidney disease or end stage renal disease: Secondary | ICD-10-CM | POA: Diagnosis not present

## 2019-04-18 DIAGNOSIS — Z9981 Dependence on supplemental oxygen: Secondary | ICD-10-CM | POA: Insufficient documentation

## 2019-04-18 DIAGNOSIS — Z992 Dependence on renal dialysis: Secondary | ICD-10-CM | POA: Diagnosis not present

## 2019-04-18 DIAGNOSIS — Z79899 Other long term (current) drug therapy: Secondary | ICD-10-CM | POA: Insufficient documentation

## 2019-04-18 DIAGNOSIS — Y999 Unspecified external cause status: Secondary | ICD-10-CM | POA: Insufficient documentation

## 2019-04-18 DIAGNOSIS — E1122 Type 2 diabetes mellitus with diabetic chronic kidney disease: Secondary | ICD-10-CM | POA: Insufficient documentation

## 2019-04-18 DIAGNOSIS — Z794 Long term (current) use of insulin: Secondary | ICD-10-CM | POA: Diagnosis not present

## 2019-04-18 DIAGNOSIS — Y9389 Activity, other specified: Secondary | ICD-10-CM | POA: Insufficient documentation

## 2019-04-18 DIAGNOSIS — I2510 Atherosclerotic heart disease of native coronary artery without angina pectoris: Secondary | ICD-10-CM | POA: Diagnosis not present

## 2019-04-18 DIAGNOSIS — D631 Anemia in chronic kidney disease: Secondary | ICD-10-CM | POA: Diagnosis not present

## 2019-04-18 DIAGNOSIS — N2581 Secondary hyperparathyroidism of renal origin: Secondary | ICD-10-CM | POA: Diagnosis not present

## 2019-04-18 MED ORDER — AMOXICILLIN 500 MG PO CAPS: 500.0000 mg | ORAL_CAPSULE | Freq: Two times a day (BID) | ORAL | 0 refills | Status: DC

## 2019-04-18 NOTE — ED Provider Notes (Signed)
[facility] EMERGENCY DEPARTMENT Provider Note   CSN: [id number] Arrival date & time: 04/18/19  0844     History   Chief Complaint Chief Complaint  Patient presents with  . Foreign Body in [place] is a 68 y.o. male.  68 year old male diabetic with other multiple chronic medical problems here with foreign body in his left ear since last evening when [name] [name] was using a Q-tip.  [name] [name] is also had some frontal headache and some nasal congestion and burning in his nose similar to his prior sinusitis episodes.  No known fever.  No cough no shortness of breath vomiting diarrhea.  No body aches.  No known Covid exposures.     The history is provided by the patient.  Foreign Body in Ear This is a new problem. The current episode started yesterday. The problem occurs constantly. The problem has not changed since onset.Associated symptoms include headaches. Pertinent negatives include no chest pain, no abdominal pain and no shortness of breath. Nothing aggravates the symptoms. Nothing relieves the symptoms. [name] [name] has tried nothing for the symptoms. The treatment provided no relief.    Past Medical History:  Diagnosis Date  . Anemia   . Arthritis   . Asthma   . Bell palsy   . CAD (coronary artery disease)    a. 2014 MV: abnl w/ infap ischemia; b. 03/2013 Cath: aneurysmal bleb in the LAD w/ otw nonobs dzs-->Med Rx.  . Chronic back pain   . Chronic knee pain    a. 09/2015 s/p R TKA.  Marland Kitchen Chronic pain   . Chronic shoulder pain   . Chronic sinusitis   . CKD (chronic kidney disease), stage IV ([place])    stage 4 per office visit note of Dr Lowanda Foster on 05/2015   . COPD (chronic obstructive pulmonary disease) ([place])   . Diabetes mellitus without complication ([place])    type II   . Essential hypertension   . GERD (gastroesophageal reflux disease)   . Gout   . Gout   . Hepatomegaly    noted on noncontrast CT 2015  . History of hiatal hernia   . Hyperlipidemia   . Lateral meniscus tear    . Obesity    [place]  . Obstructive sleep apnea    does not use cpap   . On home oxygen therapy    uses 2l when is going somewhere per patient   . PUD (peptic ulcer disease)    remote, reports f/u EGD about 8 years ago unremarkable   . Reactive airway disease    related to exposure to chemical during 9/11  . Renal insufficiency   . Sinusitis   . Vitamin D deficiency     Patient Active Problem List   Diagnosis Date Noted  . Chest pain 08/29/2018  . Constipation 02/28/2018  . End stage renal disease ([place]) 11/13/2017  . Foul smelling urine 12/14/2016  . Coronary artery disease involving native coronary artery of native heart without angina pectoris 10/27/2016  . Chronic kidney disease, stage IV (severe) ([place]) 10/27/2016  . Morbid obesity due to excess calories ([place]) 03/22/2016  . CAD (coronary artery disease)   . Erectile dysfunction 08/16/2015  . Hemorrhoids 07/05/2015  . Vitamin D deficiency 06/04/2015  . OSA on CPAP 05/14/2015  . Normocytic anemia 03/24/2015  . Fatty liver 12/01/2014  . Back pain 06/05/2013  . OA (osteoarthritis) of knee 03/15/2011  . CTS (carpal tunnel syndrome) 03/15/2011  . Diabetes mellitus  with stage 5 chronic kidney disease ([place]) 09/29/2008  . Mixed hyperlipidemia 09/29/2008  . Essential hypertension 09/29/2008  . GERD 09/29/2008    Past Surgical History:  Procedure Laterality Date  . ASAD LT SHOULDER  12/2008   left shoulder  . AV FISTULA PLACEMENT Left 08/09/2016   Procedure: BRACHIOCEPHALIC ARTERIOVENOUS (AV) FISTULA CREATION LEFT ARM;  Surgeon: [name], MD;  Location: [facility];  Service: Vascular;  Laterality: Left;  . CAPD INSERTION N/A 10/07/2018   Procedure: LAPAROSCOPIC PERITONEAL CATHETER PLACEMENT;  Surgeon: Kieth Brightly Arta Bruce, MD;  Location: [facility];  Service: General;  Laterality: N/A;  . CATARACT EXTRACTION W/PHACO Left 03/28/2016   Procedure: CATARACT EXTRACTION PHACO AND INTRAOCULAR LENS PLACEMENT LEFT EYE;  Surgeon: [name], MD;  Location: [facility];  Service: Ophthalmology;  Laterality: Left;  CDE: 4.77  . CATARACT EXTRACTION W/PHACO Right 04/11/2016   Procedure: CATARACT EXTRACTION PHACO AND INTRAOCULAR LENS PLACEMENT RIGHT EYE; CDE:  4.74;  Surgeon: [name], MD;  Location: [facility];  Service: Ophthalmology;  Laterality: Right;  . COLONOSCOPY  10/2008   Fields: Rectal polyp obliterated, not retrieved, hemorrhoids, single ascending colon diverticulum near the CV. Next colonoscopy April 2020  . COLONOSCOPY N/A 12/25/2014   SLF: 1. Colorectal polyps (2) removed 2. Small internal hemorrhoids 3. the left colon is severely redundant  . DOPPLER ECHOCARDIOGRAPHY    . ESOPHAGOGASTRODUODENOSCOPY N/A 12/25/2014   SLF: 1. Anemia most likely due to CRI, gastritis, gastric polyps 2. Moderate non-erosive gastriits and mild duodenitis.  3.TWo large gstric polyps removed.   Marland Kitchen EYE SURGERY  12/22/2010   tear duct probing-[place]  . FOREIGN BODY REMOVAL  03/29/2011   Procedure: REMOVAL FOREIGN BODY EXTREMITY;  Surgeon: Arther Abbott, MD;  Location: [facility];  Service: Orthopedics;  Laterality: Right;  Removal Foreign Body Right Thumb  . IR FLUORO GUIDE CV LINE RIGHT  08/06/2018  . IR US GUIDE VASC ACCESS RIGHT  08/06/2018  . KNEE ARTHROSCOPY  10/2007   left  . KNEE ARTHROSCOPY WITH LATERAL MENISECTOMY Right 10/14/2015   Procedure: LEFT KNEE ARTHROSCOPY WITH PARTIAL LATERAL MENISECTOMY;  Surgeon: Carole Civil, MD;  Location: [facility];  Service: Orthopedics;  Laterality: Right;  . LEFT HEART CATHETERIZATION WITH CORONARY ANGIOGRAM N/A 03/28/2013   Procedure: LEFT HEART CATHETERIZATION WITH CORONARY ANGIOGRAM;  Surgeon: Leonie Man, MD;  Location: [facility];  Service: Cardiovascular;  Laterality: N/A;  . NM MYOVIEW LTD    . PENILE PROSTHESIS IMPLANT N/A 08/16/2015   Procedure: PENILE PROTHESIS INFLATABLE, three piece, Excisional biopsy of Penile ulcer, Penile molding;  Surgeon: Carolan Clines, MD;  Location: [facility];   Service: Urology;  Laterality: N/A;  . PENILE PROSTHESIS IMPLANT N/A 12/24/2017   Procedure: REMOVAL AND  REPLACEMENT  COLOPLAST PENILE PROSTHESIS;  Surgeon: Lucas Mallow, MD;  Location: [facility];  Service: Urology;  Laterality: N/A;  . QUADRICEPS TENDON REPAIR  07/21/2011   Procedure: REPAIR QUADRICEP TENDON;  Surgeon: Arther Abbott, MD;  Location: [facility];  Service: Orthopedics;  Laterality: Right;  . TOENAIL EXCISION     removed [id number]  . UMBILICAL HERNIA REPAIR  2007   [place]        Home Medications    Prior to Admission medications   Medication Sig Start Date End Date Taking? Authorizing Provider  allopurinol (ZYLOPRIM) 100 MG tablet Take 200 mg by mouth daily.     [provider]  amLODipine (NORVASC) 10 MG tablet Take 10 mg by mouth daily.  [provider]  aspirin EC 81 MG tablet Take 1 tablet (81 mg total) by mouth daily. 05/01/16   Hilty, Nadean Corwin, MD  budesonide-formoterol (SYMBICORT) 160-4.5 MCG/ACT inhaler Inhale 2 puffs into the lungs 2 (two) times daily as needed (Shortness of Breath).     [provider]  calcitRIOL (ROCALTROL) 0.25 MCG capsule Take 0.25 mcg by mouth daily.    [provider]  cetirizine (ZYRTEC) 10 MG tablet Take 10 mg by mouth at bedtime as needed for allergies.     [provider]  DROPLET PEN NEEDLES 31G X 5 MM MISC INJECT SUBCUTANEOUSLY TWICE DAILY AS DIRECTED 03/10/19   Nida, Marella Chimes, MD  folic acid (FOLVITE) 1 MG tablet Take 1 tablet by mouth daily. 07/29/18   [provider]  hydrALAZINE (APRESOLINE) 50 MG tablet Take 1 tablet (50 mg total) by mouth 3 (three) times daily. 11/13/18   Hilty, Nadean Corwin, MD  Insulin Glargine (LANTUS SOLOSTAR) 100 UNIT/ML Solostar Pen INJECT 30 UNITS SUBCUTANEOUSLY AT BEDTIME 12/05/18   Nida, Marella Chimes, MD  mometasone (NASONEX) 50 MCG/ACT nasal spray Place 2 sprays into the nose 2 (two) times daily as needed (allergies).     [provider]  pantoprazole (PROTONIX) 40 MG tablet TAKE 1 TABLET TWICE DAILY 30 MINUTES PRIOR TO MEALS 02/28/18   Carlis Stable, NP  simvastatin (ZOCOR) 20 MG tablet Take 20 mg by mouth every morning.     [provider]  TRADJENTA 5 MG TABS tablet TAKE 1 TABLET EVERY DAY 02/24/19   Cassandria Anger, MD    Family History Family History  Problem Relation Age of Onset  . Hypertension Mother        MI  . Cancer Mother        breast   . Diabetes Mother   . Diabetes Father   . Hypertension Father   . Hypertension Sister   . Diabetes Sister   . Arthritis Other   . Asthma Other   . Lung disease Other   . Anesthesia problems Neg Hx   . Hypotension Neg Hx   . Malignant hyperthermia Neg Hx   . Pseudochol deficiency Neg Hx   . Colon cancer Neg Hx     Social History Social History   Tobacco Use  . Smoking status: Former Smoker    Packs/day: 1.00    Years: 25.00    Pack years: 25.00    Types: Cigarettes    Quit date: 03/27/2010    Years since quitting: 9.0  . Smokeless tobacco: Never Used  . Tobacco comment: Quit x 7 years  Substance Use Topics  . Alcohol use: Yes    Alcohol/week: 0.0 standard drinks    Comment: occasionally  . Drug use: Not Currently    Types: Marijuana    Comment: cocaine- last time used- 11/24/2017 , marijuana-      Allergies   Tramadol hcl, Opana [oxymorphone hcl], Oxymorphone, and Tramadol   Review of Systems Review of Systems  Constitutional: Negative for fever.  HENT: Positive for ear pain, rhinorrhea, sinus pressure and sinus pain. Negative for nosebleeds and sore throat.   Eyes: Negative for visual disturbance.  Respiratory: Negative for shortness of breath.   Cardiovascular: Negative for chest pain.  Gastrointestinal: Negative for abdominal pain.  Genitourinary: Negative for dysuria.  Musculoskeletal: Negative for neck pain.  Skin: Negative for rash.  Neurological: Positive for headaches.     Physical Exam Updated Vital  Signs Pulse 81  Temp 99.8 F (37.7 C) (Oral)   Resp 18   Ht 5\' 4"  (1.626 m)   Wt 107 kg   SpO2 97%   BMI 40.51 kg/m   Physical Exam Vitals signs and nursing note reviewed.  Constitutional:      Appearance: [name] [name] is well-developed.  HENT:     Head: Normocephalic and atraumatic.     Right Ear: Tympanic membrane normal.     Left Ear: A foreign body is present.     Nose: Rhinorrhea present.     Right Sinus: Maxillary sinus tenderness and frontal sinus tenderness present.     Left Sinus: Maxillary sinus tenderness and frontal sinus tenderness present.  Eyes:     Conjunctiva/sclera: Conjunctivae normal.  Neck:     Musculoskeletal: Neck supple.  Cardiovascular:     Rate and Rhythm: Normal rate and regular rhythm.     Heart sounds: No murmur.  Pulmonary:     Effort: Pulmonary effort is normal. No respiratory distress.     Breath sounds: Normal breath sounds.  Abdominal:     Palpations: Abdomen is soft.     Tenderness: There is no abdominal tenderness.  Skin:    General: Skin is warm and dry.     Capillary Refill: Capillary refill takes less than 2 seconds.  Neurological:     General: No focal deficit present.     Mental Status: [name] [name] is alert.      ED Treatments / Results  Labs (all labs ordered are listed, but only abnormal results are displayed) Labs Reviewed - No data to display  EKG None  Radiology No results found.  Procedures .Foreign Body Removal  Date/Time: 04/18/2019 9:19 AM Performed by: Hayden Rasmussen, MD Authorized by: Hayden Rasmussen, MD  Consent: Verbal consent obtained. Risks and benefits: risks, benefits and alternatives were discussed Consent given by: patient Patient understanding: patient states understanding of the procedure being performed Patient consent: the patient's understanding of the procedure matches consent given Required items: required blood products, implants, devices, and special equipment available Patient identity  confirmed: verbally with patient Body area: ear Location details: left ear  Sedation: Patient sedated: no  Patient restrained: no Localization method: ENT speculum Removal mechanism: bayonet forceps Complexity: simple 1 objects recovered. Objects recovered: cotton Post-procedure assessment: foreign body removed Patient tolerance: patient tolerated the procedure well with no immediate complications   (including critical care time)  Medications Ordered in ED Medications - No data to display   Initial Impression / Assessment and Plan / ED Course  I have reviewed the triage vital signs and the nursing notes.  Pertinent labs & imaging results that were available during my care of the patient were reviewed by me and considered in my medical decision making (see chart for details).  Clinical Course as of Apr 17 1650  Fri Apr 18, 2019  0920 Foreign body removed under direct visualization with bayonet forceps.  Reevaluated and TM intact.  No other foreign body seen.  [name] [name] also has 1 week of sinus symptoms will cover with antibiotics and have him follow-up with his PCP.   [MB]    Clinical Course User Index [MB] Hayden Rasmussen, MD   [name] [name] was evaluated in Emergency Department on 04/18/2019 for the symptoms described in the history of present illness. [name] [name] was evaluated in the context of the global COVID-19 pandemic, which necessitated consideration that the patient might be at risk for infection with the SARS-CoV-2 virus that causes COVID-19.  Institutional protocols and algorithms that pertain to the evaluation of patients at risk for COVID-19 are in a state of rapid change based on information released by regulatory bodies including the CDC and federal and state organizations. These policies and algorithms were followed during the patient's care in the ED.      Final Clinical Impressions(s) / ED Diagnoses   Final diagnoses:  FB ear, left, initial encounter  Acute sinusitis,  recurrence not specified, unspecified location    ED Discharge Orders         Ordered    amoxicillin (AMOXIL) 500 MG capsule  2 times daily     04/18/19 0923           Hayden Rasmussen, MD 04/18/19 1651

## 2019-04-18 NOTE — ED Triage Notes (Addendum)
Pt c/o cotton of q-tip lodged in LT ear since last night. Pt also c/o HA.

## 2019-04-18 NOTE — Discharge Instructions (Addendum)
He was seen in the emergency department for foreign body in your left ear along with some frontal headache and nasal congestion.  No foreign body was removed and we are putting you on some antibiotics for possible sinus infection.  Please drink plenty of fluids.  You can also try sinus rinse.  Please follow-up with your doctor for further evaluation.  Return if any concerns.

## 2019-04-19 DIAGNOSIS — D631 Anemia in chronic kidney disease: Secondary | ICD-10-CM | POA: Diagnosis not present

## 2019-04-19 DIAGNOSIS — D509 Iron deficiency anemia, unspecified: Secondary | ICD-10-CM | POA: Diagnosis not present

## 2019-04-19 DIAGNOSIS — N2581 Secondary hyperparathyroidism of renal origin: Secondary | ICD-10-CM | POA: Diagnosis not present

## 2019-04-19 DIAGNOSIS — N186 End stage renal disease: Secondary | ICD-10-CM | POA: Diagnosis not present

## 2019-04-19 DIAGNOSIS — Z992 Dependence on renal dialysis: Secondary | ICD-10-CM | POA: Diagnosis not present

## 2019-04-20 DIAGNOSIS — D631 Anemia in chronic kidney disease: Secondary | ICD-10-CM | POA: Diagnosis not present

## 2019-04-20 DIAGNOSIS — D509 Iron deficiency anemia, unspecified: Secondary | ICD-10-CM | POA: Diagnosis not present

## 2019-04-20 DIAGNOSIS — N186 End stage renal disease: Secondary | ICD-10-CM | POA: Diagnosis not present

## 2019-04-20 DIAGNOSIS — Z992 Dependence on renal dialysis: Secondary | ICD-10-CM | POA: Diagnosis not present

## 2019-04-20 DIAGNOSIS — N2581 Secondary hyperparathyroidism of renal origin: Secondary | ICD-10-CM | POA: Diagnosis not present

## 2019-04-21 ENCOUNTER — Other Ambulatory Visit: Payer: Self-pay | Admitting: "Endocrinology

## 2019-04-21 DIAGNOSIS — Z992 Dependence on renal dialysis: Secondary | ICD-10-CM | POA: Diagnosis not present

## 2019-04-21 DIAGNOSIS — N2581 Secondary hyperparathyroidism of renal origin: Secondary | ICD-10-CM | POA: Diagnosis not present

## 2019-04-21 DIAGNOSIS — D631 Anemia in chronic kidney disease: Secondary | ICD-10-CM | POA: Diagnosis not present

## 2019-04-21 DIAGNOSIS — D509 Iron deficiency anemia, unspecified: Secondary | ICD-10-CM | POA: Diagnosis not present

## 2019-04-21 DIAGNOSIS — N186 End stage renal disease: Secondary | ICD-10-CM | POA: Diagnosis not present

## 2019-04-22 DIAGNOSIS — Z992 Dependence on renal dialysis: Secondary | ICD-10-CM | POA: Diagnosis not present

## 2019-04-22 DIAGNOSIS — N2581 Secondary hyperparathyroidism of renal origin: Secondary | ICD-10-CM | POA: Diagnosis not present

## 2019-04-22 DIAGNOSIS — N186 End stage renal disease: Secondary | ICD-10-CM | POA: Diagnosis not present

## 2019-04-22 DIAGNOSIS — D631 Anemia in chronic kidney disease: Secondary | ICD-10-CM | POA: Diagnosis not present

## 2019-04-22 DIAGNOSIS — D509 Iron deficiency anemia, unspecified: Secondary | ICD-10-CM | POA: Diagnosis not present

## 2019-04-23 DIAGNOSIS — E119 Type 2 diabetes mellitus without complications: Secondary | ICD-10-CM | POA: Diagnosis not present

## 2019-04-23 DIAGNOSIS — Z794 Long term (current) use of insulin: Secondary | ICD-10-CM | POA: Diagnosis not present

## 2019-04-23 DIAGNOSIS — D509 Iron deficiency anemia, unspecified: Secondary | ICD-10-CM | POA: Diagnosis not present

## 2019-04-23 DIAGNOSIS — D631 Anemia in chronic kidney disease: Secondary | ICD-10-CM | POA: Diagnosis not present

## 2019-04-23 DIAGNOSIS — N2581 Secondary hyperparathyroidism of renal origin: Secondary | ICD-10-CM | POA: Diagnosis not present

## 2019-04-23 DIAGNOSIS — Z992 Dependence on renal dialysis: Secondary | ICD-10-CM | POA: Diagnosis not present

## 2019-04-23 DIAGNOSIS — N186 End stage renal disease: Secondary | ICD-10-CM | POA: Diagnosis not present

## 2019-04-24 DIAGNOSIS — Z961 Presence of intraocular lens: Secondary | ICD-10-CM | POA: Diagnosis not present

## 2019-04-24 DIAGNOSIS — E119 Type 2 diabetes mellitus without complications: Secondary | ICD-10-CM | POA: Diagnosis not present

## 2019-04-24 DIAGNOSIS — Z992 Dependence on renal dialysis: Secondary | ICD-10-CM | POA: Diagnosis not present

## 2019-04-24 DIAGNOSIS — D631 Anemia in chronic kidney disease: Secondary | ICD-10-CM | POA: Diagnosis not present

## 2019-04-24 DIAGNOSIS — N186 End stage renal disease: Secondary | ICD-10-CM | POA: Diagnosis not present

## 2019-04-24 DIAGNOSIS — D509 Iron deficiency anemia, unspecified: Secondary | ICD-10-CM | POA: Diagnosis not present

## 2019-04-24 DIAGNOSIS — N2581 Secondary hyperparathyroidism of renal origin: Secondary | ICD-10-CM | POA: Diagnosis not present

## 2019-04-24 DIAGNOSIS — Z794 Long term (current) use of insulin: Secondary | ICD-10-CM | POA: Diagnosis not present

## 2019-04-25 DIAGNOSIS — N186 End stage renal disease: Secondary | ICD-10-CM | POA: Diagnosis not present

## 2019-04-25 DIAGNOSIS — D509 Iron deficiency anemia, unspecified: Secondary | ICD-10-CM | POA: Diagnosis not present

## 2019-04-25 DIAGNOSIS — Z992 Dependence on renal dialysis: Secondary | ICD-10-CM | POA: Diagnosis not present

## 2019-04-25 DIAGNOSIS — N2581 Secondary hyperparathyroidism of renal origin: Secondary | ICD-10-CM | POA: Diagnosis not present

## 2019-04-25 DIAGNOSIS — D631 Anemia in chronic kidney disease: Secondary | ICD-10-CM | POA: Diagnosis not present

## 2019-04-26 DIAGNOSIS — N186 End stage renal disease: Secondary | ICD-10-CM | POA: Diagnosis not present

## 2019-04-26 DIAGNOSIS — Z992 Dependence on renal dialysis: Secondary | ICD-10-CM | POA: Diagnosis not present

## 2019-04-26 DIAGNOSIS — D509 Iron deficiency anemia, unspecified: Secondary | ICD-10-CM | POA: Diagnosis not present

## 2019-04-26 DIAGNOSIS — D631 Anemia in chronic kidney disease: Secondary | ICD-10-CM | POA: Diagnosis not present

## 2019-04-26 DIAGNOSIS — N2581 Secondary hyperparathyroidism of renal origin: Secondary | ICD-10-CM | POA: Diagnosis not present

## 2019-04-27 DIAGNOSIS — N2581 Secondary hyperparathyroidism of renal origin: Secondary | ICD-10-CM | POA: Diagnosis not present

## 2019-04-27 DIAGNOSIS — D631 Anemia in chronic kidney disease: Secondary | ICD-10-CM | POA: Diagnosis not present

## 2019-04-27 DIAGNOSIS — Z992 Dependence on renal dialysis: Secondary | ICD-10-CM | POA: Diagnosis not present

## 2019-04-27 DIAGNOSIS — N186 End stage renal disease: Secondary | ICD-10-CM | POA: Diagnosis not present

## 2019-04-27 DIAGNOSIS — D509 Iron deficiency anemia, unspecified: Secondary | ICD-10-CM | POA: Diagnosis not present

## 2019-04-28 DIAGNOSIS — Z992 Dependence on renal dialysis: Secondary | ICD-10-CM | POA: Diagnosis not present

## 2019-04-28 DIAGNOSIS — D631 Anemia in chronic kidney disease: Secondary | ICD-10-CM | POA: Diagnosis not present

## 2019-04-28 DIAGNOSIS — D509 Iron deficiency anemia, unspecified: Secondary | ICD-10-CM | POA: Diagnosis not present

## 2019-04-28 DIAGNOSIS — N186 End stage renal disease: Secondary | ICD-10-CM | POA: Diagnosis not present

## 2019-04-28 DIAGNOSIS — N2581 Secondary hyperparathyroidism of renal origin: Secondary | ICD-10-CM | POA: Diagnosis not present

## 2019-04-29 DIAGNOSIS — Z992 Dependence on renal dialysis: Secondary | ICD-10-CM | POA: Diagnosis not present

## 2019-04-29 DIAGNOSIS — D509 Iron deficiency anemia, unspecified: Secondary | ICD-10-CM | POA: Diagnosis not present

## 2019-04-29 DIAGNOSIS — D631 Anemia in chronic kidney disease: Secondary | ICD-10-CM | POA: Diagnosis not present

## 2019-04-29 DIAGNOSIS — N186 End stage renal disease: Secondary | ICD-10-CM | POA: Diagnosis not present

## 2019-04-29 DIAGNOSIS — N2581 Secondary hyperparathyroidism of renal origin: Secondary | ICD-10-CM | POA: Diagnosis not present

## 2019-04-30 DIAGNOSIS — Z992 Dependence on renal dialysis: Secondary | ICD-10-CM | POA: Diagnosis not present

## 2019-04-30 DIAGNOSIS — D509 Iron deficiency anemia, unspecified: Secondary | ICD-10-CM | POA: Diagnosis not present

## 2019-04-30 DIAGNOSIS — D631 Anemia in chronic kidney disease: Secondary | ICD-10-CM | POA: Diagnosis not present

## 2019-04-30 DIAGNOSIS — N2581 Secondary hyperparathyroidism of renal origin: Secondary | ICD-10-CM | POA: Diagnosis not present

## 2019-04-30 DIAGNOSIS — N186 End stage renal disease: Secondary | ICD-10-CM | POA: Diagnosis not present

## 2019-05-01 DIAGNOSIS — N186 End stage renal disease: Secondary | ICD-10-CM | POA: Diagnosis not present

## 2019-05-01 DIAGNOSIS — D509 Iron deficiency anemia, unspecified: Secondary | ICD-10-CM | POA: Diagnosis not present

## 2019-05-01 DIAGNOSIS — Z992 Dependence on renal dialysis: Secondary | ICD-10-CM | POA: Diagnosis not present

## 2019-05-01 DIAGNOSIS — N2581 Secondary hyperparathyroidism of renal origin: Secondary | ICD-10-CM | POA: Diagnosis not present

## 2019-05-01 DIAGNOSIS — D631 Anemia in chronic kidney disease: Secondary | ICD-10-CM | POA: Diagnosis not present

## 2019-05-02 DIAGNOSIS — D631 Anemia in chronic kidney disease: Secondary | ICD-10-CM | POA: Diagnosis not present

## 2019-05-02 DIAGNOSIS — D509 Iron deficiency anemia, unspecified: Secondary | ICD-10-CM | POA: Diagnosis not present

## 2019-05-02 DIAGNOSIS — N2581 Secondary hyperparathyroidism of renal origin: Secondary | ICD-10-CM | POA: Diagnosis not present

## 2019-05-02 DIAGNOSIS — Z992 Dependence on renal dialysis: Secondary | ICD-10-CM | POA: Diagnosis not present

## 2019-05-02 DIAGNOSIS — N186 End stage renal disease: Secondary | ICD-10-CM | POA: Diagnosis not present

## 2019-05-03 DIAGNOSIS — N186 End stage renal disease: Secondary | ICD-10-CM | POA: Diagnosis not present

## 2019-05-03 DIAGNOSIS — N2581 Secondary hyperparathyroidism of renal origin: Secondary | ICD-10-CM | POA: Diagnosis not present

## 2019-05-03 DIAGNOSIS — D631 Anemia in chronic kidney disease: Secondary | ICD-10-CM | POA: Diagnosis not present

## 2019-05-03 DIAGNOSIS — D509 Iron deficiency anemia, unspecified: Secondary | ICD-10-CM | POA: Diagnosis not present

## 2019-05-03 DIAGNOSIS — Z992 Dependence on renal dialysis: Secondary | ICD-10-CM | POA: Diagnosis not present

## 2019-05-04 DIAGNOSIS — D631 Anemia in chronic kidney disease: Secondary | ICD-10-CM | POA: Diagnosis not present

## 2019-05-04 DIAGNOSIS — N186 End stage renal disease: Secondary | ICD-10-CM | POA: Diagnosis not present

## 2019-05-04 DIAGNOSIS — N2581 Secondary hyperparathyroidism of renal origin: Secondary | ICD-10-CM | POA: Diagnosis not present

## 2019-05-04 DIAGNOSIS — D509 Iron deficiency anemia, unspecified: Secondary | ICD-10-CM | POA: Diagnosis not present

## 2019-05-04 DIAGNOSIS — Z992 Dependence on renal dialysis: Secondary | ICD-10-CM | POA: Diagnosis not present

## 2019-05-05 DIAGNOSIS — D631 Anemia in chronic kidney disease: Secondary | ICD-10-CM | POA: Diagnosis not present

## 2019-05-05 DIAGNOSIS — N2581 Secondary hyperparathyroidism of renal origin: Secondary | ICD-10-CM | POA: Diagnosis not present

## 2019-05-05 DIAGNOSIS — N186 End stage renal disease: Secondary | ICD-10-CM | POA: Diagnosis not present

## 2019-05-05 DIAGNOSIS — Z992 Dependence on renal dialysis: Secondary | ICD-10-CM | POA: Diagnosis not present

## 2019-05-05 DIAGNOSIS — D509 Iron deficiency anemia, unspecified: Secondary | ICD-10-CM | POA: Diagnosis not present

## 2019-05-06 DIAGNOSIS — N186 End stage renal disease: Secondary | ICD-10-CM | POA: Diagnosis not present

## 2019-05-06 DIAGNOSIS — N2581 Secondary hyperparathyroidism of renal origin: Secondary | ICD-10-CM | POA: Diagnosis not present

## 2019-05-06 DIAGNOSIS — D509 Iron deficiency anemia, unspecified: Secondary | ICD-10-CM | POA: Diagnosis not present

## 2019-05-06 DIAGNOSIS — D631 Anemia in chronic kidney disease: Secondary | ICD-10-CM | POA: Diagnosis not present

## 2019-05-06 DIAGNOSIS — Z992 Dependence on renal dialysis: Secondary | ICD-10-CM | POA: Diagnosis not present

## 2019-05-07 DIAGNOSIS — D631 Anemia in chronic kidney disease: Secondary | ICD-10-CM | POA: Diagnosis not present

## 2019-05-07 DIAGNOSIS — Z992 Dependence on renal dialysis: Secondary | ICD-10-CM | POA: Diagnosis not present

## 2019-05-07 DIAGNOSIS — N186 End stage renal disease: Secondary | ICD-10-CM | POA: Diagnosis not present

## 2019-05-07 DIAGNOSIS — N2581 Secondary hyperparathyroidism of renal origin: Secondary | ICD-10-CM | POA: Diagnosis not present

## 2019-05-07 DIAGNOSIS — D509 Iron deficiency anemia, unspecified: Secondary | ICD-10-CM | POA: Diagnosis not present

## 2019-05-08 DIAGNOSIS — N2581 Secondary hyperparathyroidism of renal origin: Secondary | ICD-10-CM | POA: Diagnosis not present

## 2019-05-08 DIAGNOSIS — D509 Iron deficiency anemia, unspecified: Secondary | ICD-10-CM | POA: Diagnosis not present

## 2019-05-08 DIAGNOSIS — D631 Anemia in chronic kidney disease: Secondary | ICD-10-CM | POA: Diagnosis not present

## 2019-05-08 DIAGNOSIS — Z992 Dependence on renal dialysis: Secondary | ICD-10-CM | POA: Diagnosis not present

## 2019-05-08 DIAGNOSIS — N186 End stage renal disease: Secondary | ICD-10-CM | POA: Diagnosis not present

## 2019-05-09 ENCOUNTER — Emergency Department (HOSPITAL_COMMUNITY)
Admission: EM | Admit: 2019-05-09 | Discharge: 2019-05-09 | Disposition: A | Discharge status: Home / Self Care | Payer: Medicare Other | Attending: Emergency Medicine | Admitting: Emergency Medicine

## 2019-05-09 ENCOUNTER — Encounter (HOSPITAL_COMMUNITY): Payer: Self-pay | Admitting: *Deleted

## 2019-05-09 ENCOUNTER — Other Ambulatory Visit: Payer: Self-pay

## 2019-05-09 DIAGNOSIS — I120 Hypertensive chronic kidney disease with stage 5 chronic kidney disease or end stage renal disease: Secondary | ICD-10-CM | POA: Diagnosis not present

## 2019-05-09 DIAGNOSIS — Z794 Long term (current) use of insulin: Secondary | ICD-10-CM | POA: Diagnosis not present

## 2019-05-09 DIAGNOSIS — N186 End stage renal disease: Secondary | ICD-10-CM | POA: Diagnosis not present

## 2019-05-09 DIAGNOSIS — R0981 Nasal congestion: Secondary | ICD-10-CM

## 2019-05-09 DIAGNOSIS — I2510 Atherosclerotic heart disease of native coronary artery without angina pectoris: Secondary | ICD-10-CM | POA: Insufficient documentation

## 2019-05-09 DIAGNOSIS — Z7982 Long term (current) use of aspirin: Secondary | ICD-10-CM | POA: Insufficient documentation

## 2019-05-09 DIAGNOSIS — D631 Anemia in chronic kidney disease: Secondary | ICD-10-CM | POA: Diagnosis not present

## 2019-05-09 DIAGNOSIS — E1122 Type 2 diabetes mellitus with diabetic chronic kidney disease: Secondary | ICD-10-CM | POA: Diagnosis not present

## 2019-05-09 DIAGNOSIS — J449 Chronic obstructive pulmonary disease, unspecified: Secondary | ICD-10-CM | POA: Diagnosis not present

## 2019-05-09 DIAGNOSIS — Z87891 Personal history of nicotine dependence: Secondary | ICD-10-CM | POA: Diagnosis not present

## 2019-05-09 DIAGNOSIS — F1210 Cannabis abuse, uncomplicated: Secondary | ICD-10-CM | POA: Insufficient documentation

## 2019-05-09 DIAGNOSIS — Z79899 Other long term (current) drug therapy: Secondary | ICD-10-CM | POA: Diagnosis not present

## 2019-05-09 DIAGNOSIS — D509 Iron deficiency anemia, unspecified: Secondary | ICD-10-CM | POA: Diagnosis not present

## 2019-05-09 DIAGNOSIS — Z77098 Contact with and (suspected) exposure to other hazardous, chiefly nonmedicinal, chemicals: Secondary | ICD-10-CM | POA: Diagnosis not present

## 2019-05-09 DIAGNOSIS — Z992 Dependence on renal dialysis: Secondary | ICD-10-CM | POA: Diagnosis not present

## 2019-05-09 DIAGNOSIS — N2581 Secondary hyperparathyroidism of renal origin: Secondary | ICD-10-CM | POA: Diagnosis not present

## 2019-05-09 MED ORDER — HYDROCODONE-ACETAMINOPHEN 5-325 MG PO TABS: 1.0000 | ORAL_TABLET | Freq: Four times a day (QID) | ORAL | 0 refills | Status: DC | PRN

## 2019-05-09 MED ORDER — FLUTICASONE PROPIONATE 50 MCG/ACT NA SUSP
2.0000 | Freq: Once | NASAL | Status: DC
  Filled 2019-05-09 (×2): qty 16

## 2019-05-09 MED ORDER — FLUTICASONE PROPIONATE 50 MCG/ACT NA SUSP: 2.0000 | Freq: Every day | NASAL | 0 refills | Status: DC

## 2019-05-09 MED ORDER — LORATADINE 10 MG PO TABS: 10.0000 mg | ORAL_TABLET | Freq: Every day | ORAL | 0 refills | Status: DC | PRN

## 2019-05-09 MED ORDER — LORATADINE 10 MG PO TABS
10.0000 mg | ORAL_TABLET | Freq: Once | ORAL | Status: AC
  Administered 2019-05-09: 10 mg via ORAL
  Filled 2019-05-09: qty 1

## 2019-05-09 NOTE — Discharge Instructions (Addendum)
Use the nasal spray and the claritin as directed.  You should spray 1 spray of the flonase up both nostrils once daily.  These medicines should reduce the swelling and pain you are experiencing.  You may take the hydrocodone tablets given if needed for pain relief.  This will make you drowsy - do not drive within 4 hours of taking this medication.  Avoid rubbing your nose and face/eyes as this will prolong the swelling.  You may try using a cold compress when the itching worsens.

## 2019-05-09 NOTE — ED Triage Notes (Signed)
Pt c/o nasal irritation,pain and  burning after inhaling pain fumes a week ago,

## 2019-05-09 NOTE — ED Provider Notes (Signed)
[facility] EMERGENCY DEPARTMENT Provider Note   CSN: [id number] Arrival date & time: 05/09/19  1627     History   Chief Complaint Chief Complaint  Patient presents with  . Nasal Congestion    HPI [name] [name] is a 68 y.o. male with a history significant for asthma, CAD, COPD and chronic sinusitis presenting with right nasal congestion and burning pain in his right nostril along with bilateral eye itching and eyelid swelling since he was exposed to paint fumes one week ago. He is refurbishing a car and was present when it was primed last week.  Since that time he has had escalating nasal and eye symptoms.  He denies fevers or chills, denies sore throat, sob, wheezing. He has had no treatments prior to arrival.  Nasal secretions have been clear and watery and he reports increased tear production but has been clear.      The history is provided by the patient.    Past Medical History:  Diagnosis Date  . Anemia   . Arthritis   . Asthma   . Bell palsy   . CAD (coronary artery disease)    a. 2014 MV: abnl w/ infap ischemia; b. 03/2013 Cath: aneurysmal bleb in the LAD w/ otw nonobs dzs-->Med Rx.  . Chronic back pain   . Chronic knee pain    a. 09/2015 s/p R TKA.  Marland Kitchen Chronic pain   . Chronic shoulder pain   . Chronic sinusitis   . CKD (chronic kidney disease), stage IV ([place])    stage 4 per office visit note of Dr Lowanda Foster on 05/2015   . COPD (chronic obstructive pulmonary disease) ([place])   . Diabetes mellitus without complication ([place])    type II   . Essential hypertension   . GERD (gastroesophageal reflux disease)   . Gout   . Gout   . Hepatomegaly    noted on noncontrast CT 2015  . History of hiatal hernia   . Hyperlipidemia   . Lateral meniscus tear   . Obesity    [place]  . Obstructive sleep apnea    does not use cpap   . On home oxygen therapy    uses 2l when is going somewhere per patient   . PUD (peptic ulcer disease)    remote, reports f/u EGD about 8 years  ago unremarkable   . Reactive airway disease    related to exposure to chemical during 9/11  . Renal insufficiency   . Sinusitis   . Vitamin D deficiency     Patient Active Problem List   Diagnosis Date Noted  . Chest pain 08/29/2018  . Constipation 02/28/2018  . End stage renal disease ([place]) 11/13/2017  . Foul smelling urine 12/14/2016  . Coronary artery disease involving native coronary artery of native heart without angina pectoris 10/27/2016  . Chronic kidney disease, stage IV (severe) ([place]) 10/27/2016  . Morbid obesity due to excess calories ([place]) 03/22/2016  . CAD (coronary artery disease)   . Erectile dysfunction 08/16/2015  . Hemorrhoids 07/05/2015  . Vitamin D deficiency 06/04/2015  . OSA on CPAP 05/14/2015  . Normocytic anemia 03/24/2015  . Fatty liver 12/01/2014  . Back pain 06/05/2013  . OA (osteoarthritis) of knee 03/15/2011  . CTS (carpal tunnel syndrome) 03/15/2011  . Diabetes mellitus with stage 5 chronic kidney disease ([place]) 09/29/2008  . Mixed hyperlipidemia 09/29/2008  . Essential hypertension 09/29/2008  . GERD 09/29/2008    Past Surgical History:  Procedure Laterality Date  .  ASAD LT SHOULDER  12/2008   left shoulder  . AV FISTULA PLACEMENT Left 08/09/2016   Procedure: BRACHIOCEPHALIC ARTERIOVENOUS (AV) FISTULA CREATION LEFT ARM;  Surgeon: [name], MD;  Location: [place];  Service: Vascular;  Laterality: Left;  . CAPD INSERTION N/A 10/07/2018   Procedure: LAPAROSCOPIC PERITONEAL CATHETER PLACEMENT;  Surgeon: Kieth Brightly Arta Bruce, MD;  Location: [facility];  Service: General;  Laterality: N/A;  . CATARACT EXTRACTION W/PHACO Left 03/28/2016   Procedure: CATARACT EXTRACTION PHACO AND INTRAOCULAR LENS PLACEMENT LEFT EYE;  Surgeon: [name], MD;  Location: [facility];  Service: Ophthalmology;  Laterality: Left;  CDE: 4.77  . CATARACT EXTRACTION W/PHACO Right 04/11/2016   Procedure: CATARACT EXTRACTION PHACO AND INTRAOCULAR LENS PLACEMENT RIGHT EYE; CDE:   4.74;  Surgeon: [name], MD;  Location: [facility];  Service: Ophthalmology;  Laterality: Right;  . COLONOSCOPY  10/2008   Fields: Rectal polyp obliterated, not retrieved, hemorrhoids, single ascending colon diverticulum near the CV. Next colonoscopy April 2020  . COLONOSCOPY N/A 12/25/2014   SLF: 1. Colorectal polyps (2) removed 2. Small internal hemorrhoids 3. the left colon is severely redundant  . DOPPLER ECHOCARDIOGRAPHY    . ESOPHAGOGASTRODUODENOSCOPY N/A 12/25/2014   SLF: 1. Anemia most likely due to CRI, gastritis, gastric polyps 2. Moderate non-erosive gastriits and mild duodenitis.  3.TWo large gstric polyps removed.   Marland Kitchen EYE SURGERY  12/22/2010   tear duct probing-[place]  . FOREIGN BODY REMOVAL  03/29/2011   Procedure: REMOVAL FOREIGN BODY EXTREMITY;  Surgeon: Arther Abbott, MD;  Location: [facility];  Service: Orthopedics;  Laterality: Right;  Removal Foreign Body Right Thumb  . IR FLUORO GUIDE CV LINE RIGHT  08/06/2018  . IR US GUIDE VASC ACCESS RIGHT  08/06/2018  . KNEE ARTHROSCOPY  10/2007   left  . KNEE ARTHROSCOPY WITH LATERAL MENISECTOMY Right 10/14/2015   Procedure: LEFT KNEE ARTHROSCOPY WITH PARTIAL LATERAL MENISECTOMY;  Surgeon: Carole Civil, MD;  Location: [facility];  Service: Orthopedics;  Laterality: Right;  . LEFT HEART CATHETERIZATION WITH CORONARY ANGIOGRAM N/A 03/28/2013   Procedure: LEFT HEART CATHETERIZATION WITH CORONARY ANGIOGRAM;  Surgeon: Leonie Man, MD;  Location[facility];  Service: Cardiovascular;  Laterality: N/A;  . NM MYOVIEW LTD    . PENILE PROSTHESIS IMPLANT N/A 08/16/2015   Procedure: PENILE PROTHESIS INFLATABLE, three piece, Excisional biopsy of Penile ulcer, Penile molding;  Surgeon: Carolan Clines, MD;  Location: [facility];  Service: Urology;  Laterality: N/A;  . PENILE PROSTHESIS IMPLANT N/A 12/24/2017   Procedure: REMOVAL AND  REPLACEMENT  COLOPLAST PENILE PROSTHESIS;  Surgeon: Lucas Mallow, MD;  Location: [facility];  Service: Urology;   Laterality: N/A;  . QUADRICEPS TENDON REPAIR  07/21/2011   Procedure: REPAIR QUADRICEP TENDON;  Surgeon: Arther Abbott, MD;  Location: [facility];  Service: Orthopedics;  Laterality: Right;  . TOENAIL EXCISION     removed [id number]  . UMBILICAL HERNIA REPAIR  2007   [place]        Home Medications    Prior to Admission medications   Medication Sig Start Date End Date Taking? Authorizing Provider  allopurinol (ZYLOPRIM) 100 MG tablet Take 200 mg by mouth daily.     [provider]  amLODipine (NORVASC) 10 MG tablet Take 10 mg by mouth daily.    [provider]  amoxicillin (AMOXIL) 500 MG capsule Take 1 capsule (500 mg total) by mouth 2 (two) times daily. 04/18/19   Hayden Rasmussen, MD  aspirin EC 81  MG tablet Take 1 tablet (81 mg total) by mouth daily. 05/01/16   Hilty, Nadean Corwin, MD  budesonide-formoterol (SYMBICORT) 160-4.5 MCG/ACT inhaler Inhale 2 puffs into the lungs 2 (two) times daily as needed (Shortness of Breath).     [provider]  calcitRIOL (ROCALTROL) 0.25 MCG capsule Take 0.25 mcg by mouth daily.    [provider]  cetirizine (ZYRTEC) 10 MG tablet Take 10 mg by mouth at bedtime as needed for allergies.     [provider]  DROPLET PEN NEEDLES 31G X 5 MM MISC INJECT SUBCUTANEOUSLY TWICE DAILY AS DIRECTED 03/10/19   Nida, Marella Chimes, MD  fluticasone (FLONASE) 50 MCG/ACT nasal spray Place 2 sprays into both nostrils daily. 05/09/19   Evalee Jefferson, PA-C  folic acid (FOLVITE) 1 MG tablet Take 1 tablet by mouth daily. 07/29/18   [provider]  hydrALAZINE (APRESOLINE) 50 MG tablet Take 1 tablet (50 mg total) by mouth 3 (three) times daily. 11/13/18   Hilty, Nadean Corwin, MD  HYDROcodone-acetaminophen (NORCO/VICODIN) 5-325 MG tablet Take 1 tablet by mouth every 6 (six) hours as needed for severe pain. 05/09/19   Evalee Jefferson, PA-C  Insulin Glargine (LANTUS SOLOSTAR) 100 UNIT/ML Solostar Pen INJECT 30 UNITS SUBCUTANEOUSLY AT  BEDTIME 12/05/18   Cassandria Anger, MD  loratadine (CLARITIN) 10 MG tablet Take 1 tablet (10 mg total) by mouth daily as needed for rhinitis or itching. 05/09/19   Mashayla Lavin, Almyra Free, PA-C  mometasone (NASONEX) 50 MCG/ACT nasal spray Place 2 sprays into the nose 2 (two) times daily as needed (allergies).     [provider]  pantoprazole (PROTONIX) 40 MG tablet TAKE 1 TABLET TWICE DAILY 30 MINUTES PRIOR TO MEALS 02/28/18   Carlis Stable, NP  simvastatin (ZOCOR) 20 MG tablet Take 20 mg by mouth every morning.     [provider]  TRADJENTA 5 MG TABS tablet TAKE 1 TABLET EVERY DAY 04/22/19   Cassandria Anger, MD    Family History Family History  Problem Relation Age of Onset  . Hypertension Mother        MI  . Cancer Mother        breast   . Diabetes Mother   . Diabetes Father   . Hypertension Father   . Hypertension Sister   . Diabetes Sister   . Arthritis Other   . Asthma Other   . Lung disease Other   . Anesthesia problems Neg Hx   . Hypotension Neg Hx   . Malignant hyperthermia Neg Hx   . Pseudochol deficiency Neg Hx   . Colon cancer Neg Hx     Social History Social History   Tobacco Use  . Smoking status: Former Smoker    Packs/day: 1.00    Years: 25.00    Pack years: 25.00    Types: Cigarettes    Quit date: 03/27/2010    Years since quitting: 9.1  . Smokeless tobacco: Never Used  . Tobacco comment: Quit x 7 years  Substance Use Topics  . Alcohol use: Yes    Alcohol/week: 0.0 standard drinks    Comment: occasionally  . Drug use: Not Currently    Types: Marijuana    Comment: cocaine- last time used- 11/24/2017 , marijuana-      Allergies   Tramadol hcl, Opana [oxymorphone hcl], Oxymorphone, and Tramadol   Review of Systems Review of Systems  Constitutional: Negative for chills and fever.  HENT: Positive for congestion, facial swelling and rhinorrhea. Negative  for ear discharge, ear pain, sinus pressure, sore throat, trouble swallowing and  voice change.   Eyes: Positive for itching. Negative for discharge and visual disturbance.  Respiratory: Negative for cough, shortness of breath, wheezing and stridor.   Cardiovascular: Negative for chest pain.  Gastrointestinal: Negative for abdominal pain.  Genitourinary: Negative.      Physical Exam Updated Vital Signs BP (!) 144/86 (BP Location: Right Arm)   Pulse 70   Temp 98.6 F (37 C) (Oral)   Resp 18   Ht 5\' 4"  (1.626 m)   Wt 107 kg   SpO2 97%   BMI 40.51 kg/m   Physical Exam Constitutional:      Appearance: He is well-developed.  HENT:     Head: Normocephalic and atraumatic.     Nose: Nasal tenderness, mucosal edema, congestion and rhinorrhea present.     Right Turbinates: Enlarged and swollen.     Comments: No sinus pain.  TTP along right nose externally. No erythema or induration. No nasal mucosal lesions/rash/ulcers.     Mouth/Throat:     Mouth: Mucous membranes are moist.     Pharynx: Uvula midline. No pharyngeal swelling, oropharyngeal exudate or posterior oropharyngeal erythema.     Tonsils: No tonsillar abscesses.  Eyes:     General: No allergic shiner.    Extraocular Movements:     Right eye: Normal extraocular motion.     Left eye: Normal extraocular motion.     Conjunctiva/sclera:     Right eye: Right conjunctiva is injected. No chemosis or exudate.    Left eye: Left conjunctiva is injected. No chemosis or exudate.    Comments: Mild bilateral eyelid edema.  No discharge noted.   Cardiovascular:     Rate and Rhythm: Normal rate.     Heart sounds: Normal heart sounds.  Pulmonary:     Effort: Pulmonary effort is normal. No respiratory distress.     Breath sounds: No wheezing or rales.  Abdominal:     Palpations: Abdomen is soft.     Tenderness: There is no abdominal tenderness.  Musculoskeletal: Normal range of motion.  Skin:    General: Skin is warm and dry.     Findings: No rash.  Neurological:     Mental Status: He is alert and oriented to  person, place, and time.      ED Treatments / Results  Labs (all labs ordered are listed, but only abnormal results are displayed) Labs Reviewed - No data to display  EKG None  Radiology No results found.  Procedures Procedures (including critical care time)  Medications Ordered in ED Medications  loratadine (CLARITIN) tablet 10 mg (10 mg Oral Given 05/09/19 2011)     Initial Impression / Assessment and Plan / ED Course  I have reviewed the triage vital signs and the nursing notes.  Pertinent labs & imaging results that were available during my care of the patient were reviewed by me and considered in my medical decision making (see chart for details).        Pt with exposure to pain fumes with persistent allergic/contact allergy.  Claritin, flonase prescribed.  Discussed cool compresses in place of itching/rubbing eyes as suspect this is the source of eyelid edema.  Pt endorsed pain in right nose last night preventing sleep, hydrocodone prepack given.  Plan f/u with pcp. Pt states also has appt with Dr. Benjamine Mola 11/16. No sx or exam findings to suggest acute sinusitis.   Final Clinical Impressions(s) / ED Diagnoses  Final diagnoses:  Exposure to chemical irritant  Nasal congestion    ED Discharge Orders         Ordered    loratadine (CLARITIN) 10 MG tablet  Daily PRN     05/09/19 1958    HYDROcodone-acetaminophen (NORCO/VICODIN) 5-325 MG tablet  Every 6 hours PRN     05/09/19 2000    fluticasone (FLONASE) 50 MCG/ACT nasal spray  Daily     05/09/19 2106           Evalee Jefferson, PA-C 05/10/19 [id number]    Milton Ferguson, MD 05/11/19 2018

## 2019-05-10 DIAGNOSIS — N186 End stage renal disease: Secondary | ICD-10-CM | POA: Diagnosis not present

## 2019-05-10 DIAGNOSIS — Z992 Dependence on renal dialysis: Secondary | ICD-10-CM | POA: Diagnosis not present

## 2019-05-10 DIAGNOSIS — D509 Iron deficiency anemia, unspecified: Secondary | ICD-10-CM | POA: Diagnosis not present

## 2019-05-10 DIAGNOSIS — N2581 Secondary hyperparathyroidism of renal origin: Secondary | ICD-10-CM | POA: Diagnosis not present

## 2019-05-10 DIAGNOSIS — D631 Anemia in chronic kidney disease: Secondary | ICD-10-CM | POA: Diagnosis not present

## 2019-05-11 DIAGNOSIS — N186 End stage renal disease: Secondary | ICD-10-CM | POA: Diagnosis not present

## 2019-05-11 DIAGNOSIS — D509 Iron deficiency anemia, unspecified: Secondary | ICD-10-CM | POA: Diagnosis not present

## 2019-05-11 DIAGNOSIS — D631 Anemia in chronic kidney disease: Secondary | ICD-10-CM | POA: Diagnosis not present

## 2019-05-11 DIAGNOSIS — Z992 Dependence on renal dialysis: Secondary | ICD-10-CM | POA: Diagnosis not present

## 2019-05-11 DIAGNOSIS — N2581 Secondary hyperparathyroidism of renal origin: Secondary | ICD-10-CM | POA: Diagnosis not present

## 2019-05-12 DIAGNOSIS — D509 Iron deficiency anemia, unspecified: Secondary | ICD-10-CM | POA: Diagnosis not present

## 2019-05-12 DIAGNOSIS — D631 Anemia in chronic kidney disease: Secondary | ICD-10-CM | POA: Diagnosis not present

## 2019-05-12 DIAGNOSIS — N2581 Secondary hyperparathyroidism of renal origin: Secondary | ICD-10-CM | POA: Diagnosis not present

## 2019-05-12 DIAGNOSIS — Z992 Dependence on renal dialysis: Secondary | ICD-10-CM | POA: Diagnosis not present

## 2019-05-12 DIAGNOSIS — N186 End stage renal disease: Secondary | ICD-10-CM | POA: Diagnosis not present

## 2019-05-12 MED FILL — Hydrocodone-Acetaminophen Tab 5-325 MG: ORAL | Qty: 6 | Status: AC

## 2019-05-13 DIAGNOSIS — Z992 Dependence on renal dialysis: Secondary | ICD-10-CM | POA: Diagnosis not present

## 2019-05-13 DIAGNOSIS — N186 End stage renal disease: Secondary | ICD-10-CM | POA: Diagnosis not present

## 2019-05-13 DIAGNOSIS — D509 Iron deficiency anemia, unspecified: Secondary | ICD-10-CM | POA: Diagnosis not present

## 2019-05-13 DIAGNOSIS — N2581 Secondary hyperparathyroidism of renal origin: Secondary | ICD-10-CM | POA: Diagnosis not present

## 2019-05-13 DIAGNOSIS — D631 Anemia in chronic kidney disease: Secondary | ICD-10-CM | POA: Diagnosis not present

## 2019-05-14 DIAGNOSIS — D631 Anemia in chronic kidney disease: Secondary | ICD-10-CM | POA: Diagnosis not present

## 2019-05-14 DIAGNOSIS — N186 End stage renal disease: Secondary | ICD-10-CM | POA: Diagnosis not present

## 2019-05-14 DIAGNOSIS — N2581 Secondary hyperparathyroidism of renal origin: Secondary | ICD-10-CM | POA: Diagnosis not present

## 2019-05-14 DIAGNOSIS — Z992 Dependence on renal dialysis: Secondary | ICD-10-CM | POA: Diagnosis not present

## 2019-05-14 DIAGNOSIS — D509 Iron deficiency anemia, unspecified: Secondary | ICD-10-CM | POA: Diagnosis not present

## 2019-05-15 DIAGNOSIS — N2581 Secondary hyperparathyroidism of renal origin: Secondary | ICD-10-CM | POA: Diagnosis not present

## 2019-05-15 DIAGNOSIS — N186 End stage renal disease: Secondary | ICD-10-CM | POA: Diagnosis not present

## 2019-05-15 DIAGNOSIS — D509 Iron deficiency anemia, unspecified: Secondary | ICD-10-CM | POA: Diagnosis not present

## 2019-05-15 DIAGNOSIS — Z992 Dependence on renal dialysis: Secondary | ICD-10-CM | POA: Diagnosis not present

## 2019-05-15 DIAGNOSIS — D631 Anemia in chronic kidney disease: Secondary | ICD-10-CM | POA: Diagnosis not present

## 2019-05-16 DIAGNOSIS — N2581 Secondary hyperparathyroidism of renal origin: Secondary | ICD-10-CM | POA: Diagnosis not present

## 2019-05-16 DIAGNOSIS — D509 Iron deficiency anemia, unspecified: Secondary | ICD-10-CM | POA: Diagnosis not present

## 2019-05-16 DIAGNOSIS — N186 End stage renal disease: Secondary | ICD-10-CM | POA: Diagnosis not present

## 2019-05-16 DIAGNOSIS — D631 Anemia in chronic kidney disease: Secondary | ICD-10-CM | POA: Diagnosis not present

## 2019-05-16 DIAGNOSIS — Z992 Dependence on renal dialysis: Secondary | ICD-10-CM | POA: Diagnosis not present

## 2019-05-17 DIAGNOSIS — N186 End stage renal disease: Secondary | ICD-10-CM | POA: Diagnosis not present

## 2019-05-17 DIAGNOSIS — D509 Iron deficiency anemia, unspecified: Secondary | ICD-10-CM | POA: Diagnosis not present

## 2019-05-17 DIAGNOSIS — Z992 Dependence on renal dialysis: Secondary | ICD-10-CM | POA: Diagnosis not present

## 2019-05-17 DIAGNOSIS — D631 Anemia in chronic kidney disease: Secondary | ICD-10-CM | POA: Diagnosis not present

## 2019-05-17 DIAGNOSIS — N2581 Secondary hyperparathyroidism of renal origin: Secondary | ICD-10-CM | POA: Diagnosis not present

## 2019-05-18 DIAGNOSIS — D509 Iron deficiency anemia, unspecified: Secondary | ICD-10-CM | POA: Diagnosis not present

## 2019-05-18 DIAGNOSIS — Z992 Dependence on renal dialysis: Secondary | ICD-10-CM | POA: Diagnosis not present

## 2019-05-18 DIAGNOSIS — N186 End stage renal disease: Secondary | ICD-10-CM | POA: Diagnosis not present

## 2019-05-18 DIAGNOSIS — D631 Anemia in chronic kidney disease: Secondary | ICD-10-CM | POA: Diagnosis not present

## 2019-05-19 DIAGNOSIS — D631 Anemia in chronic kidney disease: Secondary | ICD-10-CM | POA: Diagnosis not present

## 2019-05-19 DIAGNOSIS — N186 End stage renal disease: Secondary | ICD-10-CM | POA: Diagnosis not present

## 2019-05-19 DIAGNOSIS — D509 Iron deficiency anemia, unspecified: Secondary | ICD-10-CM | POA: Diagnosis not present

## 2019-05-19 DIAGNOSIS — Z992 Dependence on renal dialysis: Secondary | ICD-10-CM | POA: Diagnosis not present

## 2019-05-20 DIAGNOSIS — D631 Anemia in chronic kidney disease: Secondary | ICD-10-CM | POA: Diagnosis not present

## 2019-05-20 DIAGNOSIS — N186 End stage renal disease: Secondary | ICD-10-CM | POA: Diagnosis not present

## 2019-05-20 DIAGNOSIS — Z992 Dependence on renal dialysis: Secondary | ICD-10-CM | POA: Diagnosis not present

## 2019-05-20 DIAGNOSIS — D509 Iron deficiency anemia, unspecified: Secondary | ICD-10-CM | POA: Diagnosis not present

## 2019-05-21 ENCOUNTER — Other Ambulatory Visit: Payer: Self-pay | Admitting: Nurse Practitioner

## 2019-05-21 DIAGNOSIS — Z992 Dependence on renal dialysis: Secondary | ICD-10-CM | POA: Diagnosis not present

## 2019-05-21 DIAGNOSIS — N186 End stage renal disease: Secondary | ICD-10-CM | POA: Diagnosis not present

## 2019-05-21 DIAGNOSIS — D509 Iron deficiency anemia, unspecified: Secondary | ICD-10-CM | POA: Diagnosis not present

## 2019-05-21 DIAGNOSIS — D631 Anemia in chronic kidney disease: Secondary | ICD-10-CM | POA: Diagnosis not present

## 2019-05-22 DIAGNOSIS — Z992 Dependence on renal dialysis: Secondary | ICD-10-CM | POA: Diagnosis not present

## 2019-05-22 DIAGNOSIS — N186 End stage renal disease: Secondary | ICD-10-CM | POA: Diagnosis not present

## 2019-05-22 DIAGNOSIS — D509 Iron deficiency anemia, unspecified: Secondary | ICD-10-CM | POA: Diagnosis not present

## 2019-05-22 DIAGNOSIS — D631 Anemia in chronic kidney disease: Secondary | ICD-10-CM | POA: Diagnosis not present

## 2019-05-23 DIAGNOSIS — D509 Iron deficiency anemia, unspecified: Secondary | ICD-10-CM | POA: Diagnosis not present

## 2019-05-23 DIAGNOSIS — Z992 Dependence on renal dialysis: Secondary | ICD-10-CM | POA: Diagnosis not present

## 2019-05-23 DIAGNOSIS — N186 End stage renal disease: Secondary | ICD-10-CM | POA: Diagnosis not present

## 2019-05-23 DIAGNOSIS — D631 Anemia in chronic kidney disease: Secondary | ICD-10-CM | POA: Diagnosis not present

## 2019-05-24 DIAGNOSIS — D509 Iron deficiency anemia, unspecified: Secondary | ICD-10-CM | POA: Diagnosis not present

## 2019-05-24 DIAGNOSIS — N186 End stage renal disease: Secondary | ICD-10-CM | POA: Diagnosis not present

## 2019-05-24 DIAGNOSIS — D631 Anemia in chronic kidney disease: Secondary | ICD-10-CM | POA: Diagnosis not present

## 2019-05-24 DIAGNOSIS — Z992 Dependence on renal dialysis: Secondary | ICD-10-CM | POA: Diagnosis not present

## 2019-05-25 DIAGNOSIS — D509 Iron deficiency anemia, unspecified: Secondary | ICD-10-CM | POA: Diagnosis not present

## 2019-05-25 DIAGNOSIS — Z992 Dependence on renal dialysis: Secondary | ICD-10-CM | POA: Diagnosis not present

## 2019-05-25 DIAGNOSIS — N186 End stage renal disease: Secondary | ICD-10-CM | POA: Diagnosis not present

## 2019-05-25 DIAGNOSIS — D631 Anemia in chronic kidney disease: Secondary | ICD-10-CM | POA: Diagnosis not present

## 2019-05-26 DIAGNOSIS — D631 Anemia in chronic kidney disease: Secondary | ICD-10-CM | POA: Diagnosis not present

## 2019-05-26 DIAGNOSIS — Z992 Dependence on renal dialysis: Secondary | ICD-10-CM | POA: Diagnosis not present

## 2019-05-26 DIAGNOSIS — D509 Iron deficiency anemia, unspecified: Secondary | ICD-10-CM | POA: Diagnosis not present

## 2019-05-26 DIAGNOSIS — N186 End stage renal disease: Secondary | ICD-10-CM | POA: Diagnosis not present

## 2019-05-27 DIAGNOSIS — Z992 Dependence on renal dialysis: Secondary | ICD-10-CM | POA: Diagnosis not present

## 2019-05-27 DIAGNOSIS — D631 Anemia in chronic kidney disease: Secondary | ICD-10-CM | POA: Diagnosis not present

## 2019-05-27 DIAGNOSIS — D509 Iron deficiency anemia, unspecified: Secondary | ICD-10-CM | POA: Diagnosis not present

## 2019-05-27 DIAGNOSIS — N186 End stage renal disease: Secondary | ICD-10-CM | POA: Diagnosis not present

## 2019-05-28 DIAGNOSIS — D631 Anemia in chronic kidney disease: Secondary | ICD-10-CM | POA: Diagnosis not present

## 2019-05-28 DIAGNOSIS — Z992 Dependence on renal dialysis: Secondary | ICD-10-CM | POA: Diagnosis not present

## 2019-05-28 DIAGNOSIS — N186 End stage renal disease: Secondary | ICD-10-CM | POA: Diagnosis not present

## 2019-05-28 DIAGNOSIS — D509 Iron deficiency anemia, unspecified: Secondary | ICD-10-CM | POA: Diagnosis not present

## 2019-05-29 DIAGNOSIS — D631 Anemia in chronic kidney disease: Secondary | ICD-10-CM | POA: Diagnosis not present

## 2019-05-29 DIAGNOSIS — Z992 Dependence on renal dialysis: Secondary | ICD-10-CM | POA: Diagnosis not present

## 2019-05-29 DIAGNOSIS — N186 End stage renal disease: Secondary | ICD-10-CM | POA: Diagnosis not present

## 2019-05-29 DIAGNOSIS — D509 Iron deficiency anemia, unspecified: Secondary | ICD-10-CM | POA: Diagnosis not present

## 2019-05-30 DIAGNOSIS — D631 Anemia in chronic kidney disease: Secondary | ICD-10-CM | POA: Diagnosis not present

## 2019-05-30 DIAGNOSIS — Z992 Dependence on renal dialysis: Secondary | ICD-10-CM | POA: Diagnosis not present

## 2019-05-30 DIAGNOSIS — D509 Iron deficiency anemia, unspecified: Secondary | ICD-10-CM | POA: Diagnosis not present

## 2019-05-30 DIAGNOSIS — N186 End stage renal disease: Secondary | ICD-10-CM | POA: Diagnosis not present

## 2019-05-31 DIAGNOSIS — D631 Anemia in chronic kidney disease: Secondary | ICD-10-CM | POA: Diagnosis not present

## 2019-05-31 DIAGNOSIS — Z992 Dependence on renal dialysis: Secondary | ICD-10-CM | POA: Diagnosis not present

## 2019-05-31 DIAGNOSIS — D509 Iron deficiency anemia, unspecified: Secondary | ICD-10-CM | POA: Diagnosis not present

## 2019-05-31 DIAGNOSIS — N186 End stage renal disease: Secondary | ICD-10-CM | POA: Diagnosis not present

## 2019-06-01 DIAGNOSIS — D631 Anemia in chronic kidney disease: Secondary | ICD-10-CM | POA: Diagnosis not present

## 2019-06-01 DIAGNOSIS — N186 End stage renal disease: Secondary | ICD-10-CM | POA: Diagnosis not present

## 2019-06-01 DIAGNOSIS — D509 Iron deficiency anemia, unspecified: Secondary | ICD-10-CM | POA: Diagnosis not present

## 2019-06-01 DIAGNOSIS — Z992 Dependence on renal dialysis: Secondary | ICD-10-CM | POA: Diagnosis not present

## 2019-06-02 ENCOUNTER — Other Ambulatory Visit: Payer: Self-pay | Admitting: "Endocrinology

## 2019-06-02 DIAGNOSIS — D631 Anemia in chronic kidney disease: Secondary | ICD-10-CM | POA: Diagnosis not present

## 2019-06-02 DIAGNOSIS — D509 Iron deficiency anemia, unspecified: Secondary | ICD-10-CM | POA: Diagnosis not present

## 2019-06-02 DIAGNOSIS — N186 End stage renal disease: Secondary | ICD-10-CM | POA: Diagnosis not present

## 2019-06-02 DIAGNOSIS — Z992 Dependence on renal dialysis: Secondary | ICD-10-CM | POA: Diagnosis not present

## 2019-06-03 DIAGNOSIS — Z992 Dependence on renal dialysis: Secondary | ICD-10-CM | POA: Diagnosis not present

## 2019-06-03 DIAGNOSIS — N186 End stage renal disease: Secondary | ICD-10-CM | POA: Diagnosis not present

## 2019-06-03 DIAGNOSIS — D631 Anemia in chronic kidney disease: Secondary | ICD-10-CM | POA: Diagnosis not present

## 2019-06-03 DIAGNOSIS — D509 Iron deficiency anemia, unspecified: Secondary | ICD-10-CM | POA: Diagnosis not present

## 2019-06-04 DIAGNOSIS — D631 Anemia in chronic kidney disease: Secondary | ICD-10-CM | POA: Diagnosis not present

## 2019-06-04 DIAGNOSIS — N186 End stage renal disease: Secondary | ICD-10-CM | POA: Diagnosis not present

## 2019-06-04 DIAGNOSIS — Z992 Dependence on renal dialysis: Secondary | ICD-10-CM | POA: Diagnosis not present

## 2019-06-04 DIAGNOSIS — D509 Iron deficiency anemia, unspecified: Secondary | ICD-10-CM | POA: Diagnosis not present

## 2019-06-05 DIAGNOSIS — N186 End stage renal disease: Secondary | ICD-10-CM | POA: Diagnosis not present

## 2019-06-05 DIAGNOSIS — D509 Iron deficiency anemia, unspecified: Secondary | ICD-10-CM | POA: Diagnosis not present

## 2019-06-05 DIAGNOSIS — Z992 Dependence on renal dialysis: Secondary | ICD-10-CM | POA: Diagnosis not present

## 2019-06-05 DIAGNOSIS — D631 Anemia in chronic kidney disease: Secondary | ICD-10-CM | POA: Diagnosis not present

## 2019-06-06 ENCOUNTER — Encounter (HOSPITAL_COMMUNITY): Payer: Self-pay

## 2019-06-06 ENCOUNTER — Other Ambulatory Visit: Payer: Self-pay

## 2019-06-06 ENCOUNTER — Emergency Department (HOSPITAL_COMMUNITY)
Admission: EM | Admit: 2019-06-06 | Discharge: 2019-06-07 | Disposition: A | Discharge status: Home / Self Care | Payer: Medicare Other | Attending: Emergency Medicine | Admitting: Emergency Medicine

## 2019-06-06 DIAGNOSIS — E1122 Type 2 diabetes mellitus with diabetic chronic kidney disease: Secondary | ICD-10-CM | POA: Diagnosis not present

## 2019-06-06 DIAGNOSIS — R220 Localized swelling, mass and lump, head: Secondary | ICD-10-CM

## 2019-06-06 DIAGNOSIS — N186 End stage renal disease: Secondary | ICD-10-CM | POA: Insufficient documentation

## 2019-06-06 DIAGNOSIS — J019 Acute sinusitis, unspecified: Secondary | ICD-10-CM | POA: Diagnosis not present

## 2019-06-06 DIAGNOSIS — Z87891 Personal history of nicotine dependence: Secondary | ICD-10-CM | POA: Diagnosis not present

## 2019-06-06 DIAGNOSIS — Z992 Dependence on renal dialysis: Secondary | ICD-10-CM | POA: Insufficient documentation

## 2019-06-06 DIAGNOSIS — Z79899 Other long term (current) drug therapy: Secondary | ICD-10-CM | POA: Diagnosis not present

## 2019-06-06 DIAGNOSIS — Z794 Long term (current) use of insulin: Secondary | ICD-10-CM | POA: Insufficient documentation

## 2019-06-06 DIAGNOSIS — J3489 Other specified disorders of nose and nasal sinuses: Secondary | ICD-10-CM | POA: Insufficient documentation

## 2019-06-06 DIAGNOSIS — L03213 Periorbital cellulitis: Secondary | ICD-10-CM | POA: Diagnosis not present

## 2019-06-06 DIAGNOSIS — I120 Hypertensive chronic kidney disease with stage 5 chronic kidney disease or end stage renal disease: Secondary | ICD-10-CM | POA: Insufficient documentation

## 2019-06-06 DIAGNOSIS — H01004 Unspecified blepharitis left upper eyelid: Secondary | ICD-10-CM | POA: Insufficient documentation

## 2019-06-06 DIAGNOSIS — I2510 Atherosclerotic heart disease of native coronary artery without angina pectoris: Secondary | ICD-10-CM | POA: Insufficient documentation

## 2019-06-06 DIAGNOSIS — Z7982 Long term (current) use of aspirin: Secondary | ICD-10-CM | POA: Insufficient documentation

## 2019-06-06 DIAGNOSIS — D509 Iron deficiency anemia, unspecified: Secondary | ICD-10-CM | POA: Diagnosis not present

## 2019-06-06 DIAGNOSIS — J449 Chronic obstructive pulmonary disease, unspecified: Secondary | ICD-10-CM | POA: Diagnosis not present

## 2019-06-06 DIAGNOSIS — D631 Anemia in chronic kidney disease: Secondary | ICD-10-CM | POA: Diagnosis not present

## 2019-06-06 NOTE — ED Triage Notes (Signed)
Pt c/o nasal/eye swelling, irritation, and burning after inhaling paint fumes about a month ago. Pt says this is his 3rd time here for the same thing.

## 2019-06-07 ENCOUNTER — Emergency Department (HOSPITAL_COMMUNITY): Payer: Medicare Other

## 2019-06-07 DIAGNOSIS — D509 Iron deficiency anemia, unspecified: Secondary | ICD-10-CM | POA: Diagnosis not present

## 2019-06-07 DIAGNOSIS — D631 Anemia in chronic kidney disease: Secondary | ICD-10-CM | POA: Diagnosis not present

## 2019-06-07 DIAGNOSIS — L03213 Periorbital cellulitis: Secondary | ICD-10-CM | POA: Diagnosis not present

## 2019-06-07 DIAGNOSIS — J019 Acute sinusitis, unspecified: Secondary | ICD-10-CM | POA: Diagnosis not present

## 2019-06-07 DIAGNOSIS — N186 End stage renal disease: Secondary | ICD-10-CM | POA: Diagnosis not present

## 2019-06-07 DIAGNOSIS — Z992 Dependence on renal dialysis: Secondary | ICD-10-CM | POA: Diagnosis not present

## 2019-06-07 LAB — CBC WITH DIFFERENTIAL/PLATELET
Abs Immature Granulocytes: 0.01 10*3/uL (ref 0.00–0.07)
Basophils Absolute: 0 10*3/uL (ref 0.0–0.1)
Basophils Relative: 1 %
Eosinophils Absolute: 0.3 10*3/uL (ref 0.0–0.5)
Eosinophils Relative: 6 %
HCT: 35.4 % — ABNORMAL LOW (ref 39.0–52.0)
Hemoglobin: 10.6 g/dL — ABNORMAL LOW (ref 13.0–17.0)
Immature Granulocytes: 0 %
Lymphocytes Relative: 15 %
Lymphs Abs: 0.7 10*3/uL (ref 0.7–4.0)
MCH: 28.6 pg (ref 26.0–34.0)
MCHC: 29.9 g/dL — ABNORMAL LOW (ref 30.0–36.0)
MCV: 95.4 fL (ref 80.0–100.0)
Monocytes Absolute: 0.6 10*3/uL (ref 0.1–1.0)
Monocytes Relative: 13 %
Neutro Abs: 3 10*3/uL (ref 1.7–7.7)
Neutrophils Relative %: 65 %
Platelets: 151 10*3/uL (ref 150–400)
RBC: 3.71 MIL/uL — ABNORMAL LOW (ref 4.22–5.81)
RDW: 15.3 % (ref 11.5–15.5)
WBC: 4.6 10*3/uL (ref 4.0–10.5)
nRBC: 0 % (ref 0.0–0.2)

## 2019-06-07 LAB — BASIC METABOLIC PANEL
Anion gap: 7 (ref 5–15)
BUN: 62 mg/dL — ABNORMAL HIGH (ref 8–23)
CO2: 23 mmol/L (ref 22–32)
Calcium: 9.1 mg/dL (ref 8.9–10.3)
Chloride: 108 mmol/L (ref 98–111)
Creatinine, Ser: 11.54 mg/dL — ABNORMAL HIGH (ref 0.61–1.24)
GFR calc Af Amer: 5 mL/min — ABNORMAL LOW (ref >60)
GFR calc non Af Amer: 4 mL/min — ABNORMAL LOW (ref >60)
Glucose, Bld: 146 mg/dL — ABNORMAL HIGH (ref 70–99)
Potassium: 4.6 mmol/L (ref 3.5–5.1)
Sodium: 138 mmol/L (ref 135–145)

## 2019-06-07 MED ORDER — CLINDAMYCIN HCL 150 MG PO CAPS: 450.0000 mg | ORAL_CAPSULE | Freq: Three times a day (TID) | ORAL | 0 refills | Status: DC

## 2019-06-07 MED ORDER — CLINDAMYCIN HCL 150 MG PO CAPS
450.0000 mg | ORAL_CAPSULE | Freq: Once | ORAL | Status: AC
  Administered 2019-06-07: 450 mg via ORAL
  Filled 2019-06-07: qty 3

## 2019-06-07 MED ORDER — ERYTHROMYCIN 5 MG/GM OP OINT
TOPICAL_OINTMENT | Freq: Three times a day (TID) | OPHTHALMIC | Status: DC
  Administered 2019-06-07: 04:00:00 via OPHTHALMIC
  Filled 2019-06-07: qty 3.5

## 2019-06-07 MED ORDER — HYDROCODONE-ACETAMINOPHEN 5-325 MG PO TABS: 1.0000 | ORAL_TABLET | Freq: Four times a day (QID) | ORAL | 0 refills | Status: DC | PRN

## 2019-06-07 MED ORDER — HYDROCODONE-ACETAMINOPHEN 5-325 MG PO TABS
2.0000 | ORAL_TABLET | Freq: Once | ORAL | Status: AC
  Administered 2019-06-07: 2 via ORAL
  Filled 2019-06-07: qty 2

## 2019-06-07 NOTE — ED Provider Notes (Signed)
[facility] EMERGENCY DEPARTMENT Provider Note   CSN: [id number] Arrival date & time: 06/06/19  2111     History   Chief Complaint Chief Complaint  Patient presents with  . Facial Swelling    eye/nose    HPI [name] [name] is a 68 y.o. male.     HPI Patient reports nasal pain for the past several weeks.  He reports exposure to paint fumes at that time and since that time he had swelling and pain in the nose.  No fevers or vomiting.  Over the past 3 to 4 days he has had worsening pain and swelling of his left eyelid.  No trauma.  No visual changes.  He does not wear contacts. His course is worsening.  Nothing improves his symptoms.  He denies facial rash Patient reports he is on home peritoneal dialysis.  Past Medical History:  Diagnosis Date  . Anemia   . Arthritis   . Asthma   . Bell palsy   . CAD (coronary artery disease)    a. 2014 MV: abnl w/ infap ischemia; b. 03/2013 Cath: aneurysmal bleb in the LAD w/ otw nonobs dzs-->Med Rx.  . Chronic back pain   . Chronic knee pain    a. 09/2015 s/p R TKA.  Marland Kitchen Chronic pain   . Chronic shoulder pain   . Chronic sinusitis   . CKD (chronic kidney disease), stage IV ([place])    stage 4 per office visit note of Dr Lowanda Foster on 05/2015   . COPD (chronic obstructive pulmonary disease) ([place])   . Diabetes mellitus without complication ([place])    type II   . Essential hypertension   . GERD (gastroesophageal reflux disease)   . Gout   . Gout   . Hepatomegaly    noted on noncontrast CT 2015  . History of hiatal hernia   . Hyperlipidemia   . Lateral meniscus tear   . Obesity    [place]  . Obstructive sleep apnea    does not use cpap   . On home oxygen therapy    uses 2l when is going somewhere per patient   . PUD (peptic ulcer disease)    remote, reports f/u EGD about 8 years ago unremarkable   . Reactive airway disease    related to exposure to chemical during 9/11  . Renal insufficiency   . Sinusitis   . Vitamin D deficiency      Patient Active Problem List   Diagnosis Date Noted  . Chest pain 08/29/2018  . Constipation 02/28/2018  . End stage renal disease ([place]) 11/13/2017  . Foul smelling urine 12/14/2016  . Coronary artery disease involving native coronary artery of native heart without angina pectoris 10/27/2016  . Chronic kidney disease, stage IV (severe) ([place]) 10/27/2016  . Morbid obesity due to excess calories ([place]) 03/22/2016  . CAD (coronary artery disease)   . Erectile dysfunction 08/16/2015  . Hemorrhoids 07/05/2015  . Vitamin D deficiency 06/04/2015  . OSA on CPAP 05/14/2015  . Normocytic anemia 03/24/2015  . Fatty liver 12/01/2014  . Back pain 06/05/2013  . OA (osteoarthritis) of knee 03/15/2011  . CTS (carpal tunnel syndrome) 03/15/2011  . Diabetes mellitus with stage 5 chronic kidney disease ([place]) 09/29/2008  . Mixed hyperlipidemia 09/29/2008  . Essential hypertension 09/29/2008  . GERD 09/29/2008    Past Surgical History:  Procedure Laterality Date  . ASAD LT SHOULDER  12/2008   left shoulder  . AV FISTULA PLACEMENT Left 08/09/2016  Procedure: BRACHIOCEPHALIC ARTERIOVENOUS (AV) FISTULA CREATION LEFT ARM;  Surgeon: [name], MD;  Location: [place];  Service: Vascular;  Laterality: Left;  . CAPD INSERTION N/A 10/07/2018   Procedure: LAPAROSCOPIC PERITONEAL CATHETER PLACEMENT;  Surgeon: Kieth Brightly Arta Bruce, MD;  Location: [facility];  Service: General;  Laterality: N/A;  . CATARACT EXTRACTION W/PHACO Left 03/28/2016   Procedure: CATARACT EXTRACTION PHACO AND INTRAOCULAR LENS PLACEMENT LEFT EYE;  Surgeon: [name], MD;  Location: [facility];  Service: Ophthalmology;  Laterality: Left;  CDE: 4.77  . CATARACT EXTRACTION W/PHACO Right 04/11/2016   Procedure: CATARACT EXTRACTION PHACO AND INTRAOCULAR LENS PLACEMENT RIGHT EYE; CDE:  4.74;  Surgeon: [name], MD;  Location: [facility];  Service: Ophthalmology;  Laterality: Right;  . COLONOSCOPY  10/2008   Fields: Rectal polyp obliterated, not  retrieved, hemorrhoids, single ascending colon diverticulum near the CV. Next colonoscopy April 2020  . COLONOSCOPY N/A 12/25/2014   SLF: 1. Colorectal polyps (2) removed 2. Small internal hemorrhoids 3. the left colon is severely redundant  . DOPPLER ECHOCARDIOGRAPHY    . ESOPHAGOGASTRODUODENOSCOPY N/A 12/25/2014   SLF: 1. Anemia most likely due to CRI, gastritis, gastric polyps 2. Moderate non-erosive gastriits and mild duodenitis.  3.TWo large gstric polyps removed.   Marland Kitchen EYE SURGERY  12/22/2010   tear duct probing-[place]  . FOREIGN BODY REMOVAL  03/29/2011   Procedure: REMOVAL FOREIGN BODY EXTREMITY;  Surgeon: Arther Abbott, MD;  Location: [facility];  Service: Orthopedics;  Laterality: Right;  Removal Foreign Body Right Thumb  . IR FLUORO GUIDE CV LINE RIGHT  08/06/2018  . IR US GUIDE VASC ACCESS RIGHT  08/06/2018  . KNEE ARTHROSCOPY  10/2007   left  . KNEE ARTHROSCOPY WITH LATERAL MENISECTOMY Right 10/14/2015   Procedure: LEFT KNEE ARTHROSCOPY WITH PARTIAL LATERAL MENISECTOMY;  Surgeon: Carole Civil, MD;  Location: [facility];  Service: Orthopedics;  Laterality: Right;  . LEFT HEART CATHETERIZATION WITH CORONARY ANGIOGRAM N/A 03/28/2013   Procedure: LEFT HEART CATHETERIZATION WITH CORONARY ANGIOGRAM;  Surgeon: Leonie Man, MD;  Location: [facility] C[facility];  Service: Cardiovascular;  Laterality: N/A;  . NM MYOVIEW LTD    . PENILE PROSTHESIS IMPLANT N/A 08/16/2015   Procedure: PENILE PROTHESIS INFLATABLE, three piece, Excisional biopsy of Penile ulcer, Penile molding;  Surgeon: Carolan Clines, MD;  Location: [facility];  Service: Urology;  Laterality: N/A;  . PENILE PROSTHESIS IMPLANT N/A 12/24/2017   Procedure: REMOVAL AND  REPLACEMENT  COLOPLAST PENILE PROSTHESIS;  Surgeon: Lucas Mallow, MD;  Location: [facility];  Service: Urology;  Laterality: N/A;  . QUADRICEPS TENDON REPAIR  07/21/2011   Procedure: REPAIR QUADRICEP TENDON;  Surgeon: Arther Abbott, MD;  Location: [facility];  Service: Orthopedics;   Laterality: Right;  . TOENAIL EXCISION     removed [id number]  . UMBILICAL HERNIA REPAIR  2007   [place]        Home Medications    Prior to Admission medications   Medication Sig Start Date End Date Taking? Authorizing Provider  allopurinol (ZYLOPRIM) 100 MG tablet Take 200 mg by mouth daily.     [provider]  amLODipine (NORVASC) 10 MG tablet Take 10 mg by mouth daily.    [provider]  amoxicillin (AMOXIL) 500 MG capsule Take 1 capsule (500 mg total) by mouth 2 (two) times daily. 04/18/19   Hayden Rasmussen, MD  aspirin EC 81 MG tablet Take 1 tablet (81 mg total) by mouth daily. 05/01/16   Pixie Casino, MD  budesonide-formoterol (SYMBICORT) 160-4.5 MCG/ACT inhaler Inhale 2 puffs into the lungs 2 (two) times daily as needed (Shortness of Breath).     [provider]  calcitRIOL (ROCALTROL) 0.25 MCG capsule Take 0.25 mcg by mouth daily.    [provider]  cetirizine (ZYRTEC) 10 MG tablet Take 10 mg by mouth at bedtime as needed for allergies.     [provider]  DROPLET PEN NEEDLES 31G X 5 MM MISC INJECT SUBCUTANEOUSLY TWICE DAILY AS DIRECTED 06/03/19   Nida, Marella Chimes, MD  fluticasone (FLONASE) 50 MCG/ACT nasal spray Place 2 sprays into both nostrils daily. 05/09/19   Evalee [place], PA-C  folic acid (FOLVITE) 1 MG tablet Take 1 tablet by mouth daily. 07/29/18   [provider]  hydrALAZINE (APRESOLINE) 50 MG tablet Take 1 tablet (50 mg total) by mouth 3 (three) times daily. 11/13/18   Hilty, Nadean Corwin, MD  HYDROcodone-acetaminophen (NORCO/VICODIN) 5-325 MG tablet Take 1 tablet by mouth every 6 (six) hours as needed for severe pain. 05/09/19   Evalee [place], PA-C  Insulin Glargine (LANTUS SOLOSTAR) 100 UNIT/ML Solostar Pen INJECT 30 UNITS SUBCUTANEOUSLY AT BEDTIME 12/05/18   Cassandria Anger, MD  loratadine (CLARITIN) 10 MG tablet Take 1 tablet (10 mg total) by mouth daily as needed for rhinitis or itching. 05/09/19    Idol, Almyra Free, PA-C  mometasone (NASONEX) 50 MCG/ACT nasal spray Place 2 sprays into the nose 2 (two) times daily as needed (allergies).     [provider]  pantoprazole (PROTONIX) 40 MG tablet TAKE 1 TABLET TWICE DAILY 30 MINUTES PRIOR TO MEALS 05/23/19   Carlis Stable, NP  simvastatin (ZOCOR) 20 MG tablet Take 20 mg by mouth every morning.     [provider]  TRADJENTA 5 MG TABS tablet TAKE 1 TABLET EVERY DAY 04/22/19   Cassandria Anger, MD    Family History Family History  Problem Relation Age of Onset  . Hypertension Mother        MI  . Cancer Mother        breast   . Diabetes Mother   . Diabetes Father   . Hypertension Father   . Hypertension Sister   . Diabetes Sister   . Arthritis Other   . Asthma Other   . Lung disease Other   . Anesthesia problems Neg Hx   . Hypotension Neg Hx   . Malignant hyperthermia Neg Hx   . Pseudochol deficiency Neg Hx   . Colon cancer Neg Hx     Social History Social History   Tobacco Use  . Smoking status: Former Smoker    Packs/day: 1.00    Years: 25.00    Pack years: 25.00    Types: Cigarettes    Quit date: 03/27/2010    Years since quitting: 9.2  . Smokeless tobacco: Never Used  . Tobacco comment: Quit x 7 years  Substance Use Topics  . Alcohol use: Yes    Alcohol/week: 0.0 standard drinks    Comment: occasionally  . Drug use: Not Currently    Types: Marijuana    Comment: cocaine- last time used- 11/24/2017 , marijuana-      Allergies   Tramadol hcl, Opana [oxymorphone hcl], Oxymorphone, and Tramadol   Review of Systems Review of Systems  Constitutional: Negative for fever.  HENT: Positive for facial swelling.   Gastrointestinal: Negative for vomiting.  All other systems reviewed and are negative.    Physical Exam Updated Vital Signs BP 139/81 (BP  Location: Right Arm)   Pulse 72   Temp 99.5 F (37.5 C) (Oral)   Resp 17   Ht 1.626 m (5\' 4" )   Wt 107.5 kg   SpO2 97%   BMI 40.68 kg/m    Physical Exam CONSTITUTIONAL: Well developed/well nourished HEAD: Normocephalic/atraumatic EYES: EOMI/PERRL, left eyelid swelling.  No rash around the eye.  See photo below No conjunctival erythema.  No obvious foreign body. Patient denies any visual complete ENMT: Mucous membranes moist, tenderness and swelling to left nare.  No obvious abscess.  No crepitus or abscess noted to face.  No intranasal abscess noted NECK: supple no meningeal signs CV: S1/S2 noted, no murmurs/rubs/gallops noted LUNGS: Lungs are clear to auscultation bilaterally, no apparent distress ABDOMEN: soft, obese NEURO: Pt is awake/alert/appropriate, moves all extremitiesx4.  No facial droop.   EXTREMITIES:  full ROM SKIN: warm, color normal PSYCH: no abnormalities of mood noted, alert and oriented to situation    Patient gave verbal permission to utilize photo for medical documentation only The image was not stored on any personal device ED Treatments / Results  Labs (all labs ordered are listed, but only abnormal results are displayed) Labs Reviewed  BASIC METABOLIC PANEL - Abnormal; Notable for the following components:      Result Value   Glucose, Bld 146 (*)    BUN 62 (*)    Creatinine, Ser 11.54 (*)    GFR calc non Af Amer 4 (*)    GFR calc Af Amer 5 (*)    All other components within normal limits  CBC WITH DIFFERENTIAL/PLATELET - Abnormal; Notable for the following components:   RBC 3.71 (*)    Hemoglobin 10.6 (*)    HCT 35.4 (*)    MCHC 29.9 (*)    All other components within normal limits    EKG None  Radiology Ct Maxillofacial Wo Contrast  Result Date: 06/07/2019 CLINICAL DATA:  Initial evaluation for acute sinusitis. Swelling and pain to left eye. EXAM: CT MAXILLOFACIAL WITHOUT CONTRAST TECHNIQUE: Multidetector CT imaging of the maxillofacial structures was performed. Multiplanar CT image reconstructions were also generated. COMPARISON:  None. FINDINGS: Osseous: No acute osseous  abnormality about the face. No discrete lytic or blastic osseous lesions. Orbits: Asymmetric soft tissue swelling seen involving the left preseptal periorbital soft tissues, most notable at the left upper eyelid. Finding is nonspecific, but could reflect sequelae of acute preseptal cellulitis. No discrete abscess or other collection identified. No evidence for intraorbital or postseptal extension. Remainder of the globes and orbital soft tissues within normal limits. Patient status post bilateral ocular lens replacement. Remote medial blowout fracture with herniation of a portion of the intraorbital fat noted at the right orbit. Sinuses: Chronic mucosal thickening noted at the left frontal sinus. DISH in ule chronic mucoperiosteal thickening present within the ethmoidal air cells, left sphenoid sinus, and maxillary sinuses. Superimposed 11 mm retention cyst present at the right maxillary sinus. 4 mm calcification at the posterior right ethmoidal air cells likely reflects a small osteoma. No air-fluid level to suggest acute sinusitis. Mastoid air cells and middle ear cavities are well pneumatized and free of fluid. Soft tissues: Remainder of the visualized soft tissues of the face and neck demonstrate no acute finding. Atherosclerotic calcifications noted about the carotid bifurcations bilaterally. Limited intracranial: Unremarkable. IMPRESSION: 1. Asymmetric soft tissue swelling involving the left preseptal periorbital soft tissues, most notable at the left upper eyelid. Finding is nonspecific, but could reflect sequelae of acute preseptal cellulitis. No discrete  abscess or other collection identified. No evidence for intraorbital or postseptal extension. 2. Mild chronic paranasal sinus disease as above. No air-fluid levels to suggest acute sinusitis at this time. 3. Remote medial blowout fracture at the right orbit. Electronically Signed   By: Jeannine Boga M.D.   On: 06/07/2019 03:04    Procedures  Procedures   Medications Ordered in ED Medications  clindamycin (CLEOCIN) capsule 450 mg (has no administration in time range)  erythromycin ophthalmic ointment (has no administration in time range)  HYDROcodone-acetaminophen (NORCO/VICODIN) 5-325 MG per tablet 2 tablet (2 tablets Oral Given 06/07/19 0225)     Initial Impression / Assessment and Plan / ED Course  I have reviewed the triage vital signs and the nursing notes.  Pertinent labs   results that were available during my care of the patient were reviewed by me and considered in my medical decision making (see chart for details).        Patient reports nose pain for about a month, now with left eye swelling.  He likely has preseptal cellulitis.  No signs of orbital cellulitis.  CT scan findings discussed with patient.  Will start on oral antibiotics as well as topical ophthalmic antibiotics.  He be given a short course of pain medication which he can tolerate He already has follow-up with ENT later this month for his nose pain and swelling  Patient is otherwise nontoxic well-appearing and appropriate for outpatient Final Clinical Impressions(s) / ED Diagnoses   Final diagnoses:  Facial swelling  Blepharitis of left upper eyelid, unspecified type    ED Discharge Orders         Ordered    HYDROcodone-acetaminophen (NORCO/VICODIN) 5-325 MG tablet  Every 6 hours PRN     06/07/19 0333    clindamycin (CLEOCIN) 150 MG capsule  3 times daily     06/07/19 0336           Ripley Fraise, MD 06/07/19 [phone]

## 2019-06-07 NOTE — Discharge Instructions (Addendum)
You could use the eye ointment up to 3 times a day for a week.  Be sure to take the oral antibiotics.  If you have any worsening pain, pain with eye movement, swelling of the eye or fever over 100 please return to the ER

## 2019-06-08 DIAGNOSIS — D631 Anemia in chronic kidney disease: Secondary | ICD-10-CM | POA: Diagnosis not present

## 2019-06-08 DIAGNOSIS — Z992 Dependence on renal dialysis: Secondary | ICD-10-CM | POA: Diagnosis not present

## 2019-06-08 DIAGNOSIS — N186 End stage renal disease: Secondary | ICD-10-CM | POA: Diagnosis not present

## 2019-06-08 DIAGNOSIS — D509 Iron deficiency anemia, unspecified: Secondary | ICD-10-CM | POA: Diagnosis not present

## 2019-06-09 DIAGNOSIS — D509 Iron deficiency anemia, unspecified: Secondary | ICD-10-CM | POA: Diagnosis not present

## 2019-06-09 DIAGNOSIS — Z992 Dependence on renal dialysis: Secondary | ICD-10-CM | POA: Diagnosis not present

## 2019-06-09 DIAGNOSIS — D631 Anemia in chronic kidney disease: Secondary | ICD-10-CM | POA: Diagnosis not present

## 2019-06-09 DIAGNOSIS — N186 End stage renal disease: Secondary | ICD-10-CM | POA: Diagnosis not present

## 2019-06-10 DIAGNOSIS — D631 Anemia in chronic kidney disease: Secondary | ICD-10-CM | POA: Diagnosis not present

## 2019-06-10 DIAGNOSIS — Z992 Dependence on renal dialysis: Secondary | ICD-10-CM | POA: Diagnosis not present

## 2019-06-10 DIAGNOSIS — N186 End stage renal disease: Secondary | ICD-10-CM | POA: Diagnosis not present

## 2019-06-10 DIAGNOSIS — D509 Iron deficiency anemia, unspecified: Secondary | ICD-10-CM | POA: Diagnosis not present

## 2019-06-11 DIAGNOSIS — Z992 Dependence on renal dialysis: Secondary | ICD-10-CM | POA: Diagnosis not present

## 2019-06-11 DIAGNOSIS — N186 End stage renal disease: Secondary | ICD-10-CM | POA: Diagnosis not present

## 2019-06-11 DIAGNOSIS — D509 Iron deficiency anemia, unspecified: Secondary | ICD-10-CM | POA: Diagnosis not present

## 2019-06-11 DIAGNOSIS — D631 Anemia in chronic kidney disease: Secondary | ICD-10-CM | POA: Diagnosis not present

## 2019-06-12 DIAGNOSIS — D509 Iron deficiency anemia, unspecified: Secondary | ICD-10-CM | POA: Diagnosis not present

## 2019-06-12 DIAGNOSIS — Z992 Dependence on renal dialysis: Secondary | ICD-10-CM | POA: Diagnosis not present

## 2019-06-12 DIAGNOSIS — D631 Anemia in chronic kidney disease: Secondary | ICD-10-CM | POA: Diagnosis not present

## 2019-06-12 DIAGNOSIS — N186 End stage renal disease: Secondary | ICD-10-CM | POA: Diagnosis not present

## 2019-06-13 DIAGNOSIS — N186 End stage renal disease: Secondary | ICD-10-CM | POA: Diagnosis not present

## 2019-06-13 DIAGNOSIS — D631 Anemia in chronic kidney disease: Secondary | ICD-10-CM | POA: Diagnosis not present

## 2019-06-13 DIAGNOSIS — D509 Iron deficiency anemia, unspecified: Secondary | ICD-10-CM | POA: Diagnosis not present

## 2019-06-13 DIAGNOSIS — Z992 Dependence on renal dialysis: Secondary | ICD-10-CM | POA: Diagnosis not present

## 2019-06-14 DIAGNOSIS — Z992 Dependence on renal dialysis: Secondary | ICD-10-CM | POA: Diagnosis not present

## 2019-06-14 DIAGNOSIS — D509 Iron deficiency anemia, unspecified: Secondary | ICD-10-CM | POA: Diagnosis not present

## 2019-06-14 DIAGNOSIS — D631 Anemia in chronic kidney disease: Secondary | ICD-10-CM | POA: Diagnosis not present

## 2019-06-14 DIAGNOSIS — N186 End stage renal disease: Secondary | ICD-10-CM | POA: Diagnosis not present

## 2019-06-15 DIAGNOSIS — Z992 Dependence on renal dialysis: Secondary | ICD-10-CM | POA: Diagnosis not present

## 2019-06-15 DIAGNOSIS — D509 Iron deficiency anemia, unspecified: Secondary | ICD-10-CM | POA: Diagnosis not present

## 2019-06-15 DIAGNOSIS — N186 End stage renal disease: Secondary | ICD-10-CM | POA: Diagnosis not present

## 2019-06-15 DIAGNOSIS — D631 Anemia in chronic kidney disease: Secondary | ICD-10-CM | POA: Diagnosis not present

## 2019-06-16 ENCOUNTER — Ambulatory Visit: Payer: Medicare Other | Admitting: "Endocrinology

## 2019-06-16 ENCOUNTER — Ambulatory Visit (INDEPENDENT_AMBULATORY_CARE_PROVIDER_SITE_OTHER): Payer: Medicare Other | Admitting: Otolaryngology

## 2019-06-16 ENCOUNTER — Other Ambulatory Visit: Payer: Self-pay

## 2019-06-16 DIAGNOSIS — D509 Iron deficiency anemia, unspecified: Secondary | ICD-10-CM | POA: Diagnosis not present

## 2019-06-16 DIAGNOSIS — N186 End stage renal disease: Secondary | ICD-10-CM | POA: Diagnosis not present

## 2019-06-16 DIAGNOSIS — J343 Hypertrophy of nasal turbinates: Secondary | ICD-10-CM | POA: Diagnosis not present

## 2019-06-16 DIAGNOSIS — D631 Anemia in chronic kidney disease: Secondary | ICD-10-CM | POA: Diagnosis not present

## 2019-06-16 DIAGNOSIS — J310 Chronic rhinitis: Secondary | ICD-10-CM

## 2019-06-16 DIAGNOSIS — Z992 Dependence on renal dialysis: Secondary | ICD-10-CM | POA: Diagnosis not present

## 2019-06-20 DIAGNOSIS — Z992 Dependence on renal dialysis: Secondary | ICD-10-CM | POA: Diagnosis not present

## 2019-06-21 NOTE — Progress Notes (Signed)
REVIEWED-NO ADDITIONAL RECOMMENDATIONS. 

## 2019-06-24 DIAGNOSIS — B351 Tinea unguium: Secondary | ICD-10-CM | POA: Diagnosis not present

## 2019-06-24 DIAGNOSIS — E1142 Type 2 diabetes mellitus with diabetic polyneuropathy: Secondary | ICD-10-CM | POA: Diagnosis not present

## 2019-07-18 DIAGNOSIS — N186 End stage renal disease: Secondary | ICD-10-CM | POA: Diagnosis not present

## 2019-07-18 DIAGNOSIS — Z992 Dependence on renal dialysis: Secondary | ICD-10-CM | POA: Diagnosis not present

## 2019-07-18 DIAGNOSIS — N2581 Secondary hyperparathyroidism of renal origin: Secondary | ICD-10-CM | POA: Diagnosis not present

## 2019-07-18 DIAGNOSIS — D509 Iron deficiency anemia, unspecified: Secondary | ICD-10-CM | POA: Diagnosis not present

## 2019-07-18 DIAGNOSIS — D631 Anemia in chronic kidney disease: Secondary | ICD-10-CM | POA: Diagnosis not present

## 2019-07-19 DIAGNOSIS — N186 End stage renal disease: Secondary | ICD-10-CM | POA: Diagnosis not present

## 2019-07-19 DIAGNOSIS — Z992 Dependence on renal dialysis: Secondary | ICD-10-CM | POA: Diagnosis not present

## 2019-07-19 DIAGNOSIS — D509 Iron deficiency anemia, unspecified: Secondary | ICD-10-CM | POA: Diagnosis not present

## 2019-07-19 DIAGNOSIS — D631 Anemia in chronic kidney disease: Secondary | ICD-10-CM | POA: Diagnosis not present

## 2019-07-19 DIAGNOSIS — N2581 Secondary hyperparathyroidism of renal origin: Secondary | ICD-10-CM | POA: Diagnosis not present

## 2019-07-20 DIAGNOSIS — Z992 Dependence on renal dialysis: Secondary | ICD-10-CM | POA: Diagnosis not present

## 2019-07-20 DIAGNOSIS — N2581 Secondary hyperparathyroidism of renal origin: Secondary | ICD-10-CM | POA: Diagnosis not present

## 2019-07-20 DIAGNOSIS — D509 Iron deficiency anemia, unspecified: Secondary | ICD-10-CM | POA: Diagnosis not present

## 2019-07-20 DIAGNOSIS — D631 Anemia in chronic kidney disease: Secondary | ICD-10-CM | POA: Diagnosis not present

## 2019-07-20 DIAGNOSIS — N186 End stage renal disease: Secondary | ICD-10-CM | POA: Diagnosis not present

## 2019-07-21 DIAGNOSIS — D631 Anemia in chronic kidney disease: Secondary | ICD-10-CM | POA: Diagnosis not present

## 2019-07-21 DIAGNOSIS — D509 Iron deficiency anemia, unspecified: Secondary | ICD-10-CM | POA: Diagnosis not present

## 2019-07-21 DIAGNOSIS — N186 End stage renal disease: Secondary | ICD-10-CM | POA: Diagnosis not present

## 2019-07-21 DIAGNOSIS — Z992 Dependence on renal dialysis: Secondary | ICD-10-CM | POA: Diagnosis not present

## 2019-07-21 DIAGNOSIS — N2581 Secondary hyperparathyroidism of renal origin: Secondary | ICD-10-CM | POA: Diagnosis not present

## 2019-07-22 ENCOUNTER — Telehealth: Payer: Self-pay | Admitting: "Endocrinology

## 2019-07-22 DIAGNOSIS — Z992 Dependence on renal dialysis: Secondary | ICD-10-CM | POA: Diagnosis not present

## 2019-07-22 DIAGNOSIS — D631 Anemia in chronic kidney disease: Secondary | ICD-10-CM | POA: Diagnosis not present

## 2019-07-22 DIAGNOSIS — N186 End stage renal disease: Secondary | ICD-10-CM | POA: Diagnosis not present

## 2019-07-22 DIAGNOSIS — D509 Iron deficiency anemia, unspecified: Secondary | ICD-10-CM | POA: Diagnosis not present

## 2019-07-22 DIAGNOSIS — N2581 Secondary hyperparathyroidism of renal origin: Secondary | ICD-10-CM | POA: Diagnosis not present

## 2019-07-23 DIAGNOSIS — D509 Iron deficiency anemia, unspecified: Secondary | ICD-10-CM | POA: Diagnosis not present

## 2019-07-23 DIAGNOSIS — N2581 Secondary hyperparathyroidism of renal origin: Secondary | ICD-10-CM | POA: Diagnosis not present

## 2019-07-23 DIAGNOSIS — D631 Anemia in chronic kidney disease: Secondary | ICD-10-CM | POA: Diagnosis not present

## 2019-07-23 DIAGNOSIS — Z992 Dependence on renal dialysis: Secondary | ICD-10-CM | POA: Diagnosis not present

## 2019-07-23 DIAGNOSIS — N186 End stage renal disease: Secondary | ICD-10-CM | POA: Diagnosis not present

## 2019-07-24 DIAGNOSIS — Z794 Long term (current) use of insulin: Secondary | ICD-10-CM | POA: Diagnosis not present

## 2019-07-24 DIAGNOSIS — Z992 Dependence on renal dialysis: Secondary | ICD-10-CM | POA: Diagnosis not present

## 2019-07-24 DIAGNOSIS — N2581 Secondary hyperparathyroidism of renal origin: Secondary | ICD-10-CM | POA: Diagnosis not present

## 2019-07-24 DIAGNOSIS — D631 Anemia in chronic kidney disease: Secondary | ICD-10-CM | POA: Diagnosis not present

## 2019-07-24 DIAGNOSIS — N186 End stage renal disease: Secondary | ICD-10-CM | POA: Diagnosis not present

## 2019-07-24 DIAGNOSIS — E119 Type 2 diabetes mellitus without complications: Secondary | ICD-10-CM | POA: Diagnosis not present

## 2019-07-24 DIAGNOSIS — D509 Iron deficiency anemia, unspecified: Secondary | ICD-10-CM | POA: Diagnosis not present

## 2019-07-24 DIAGNOSIS — E1165 Type 2 diabetes mellitus with hyperglycemia: Secondary | ICD-10-CM | POA: Diagnosis not present

## 2019-07-25 DIAGNOSIS — D509 Iron deficiency anemia, unspecified: Secondary | ICD-10-CM | POA: Diagnosis not present

## 2019-07-25 DIAGNOSIS — N186 End stage renal disease: Secondary | ICD-10-CM | POA: Diagnosis not present

## 2019-07-25 DIAGNOSIS — D631 Anemia in chronic kidney disease: Secondary | ICD-10-CM | POA: Diagnosis not present

## 2019-07-25 DIAGNOSIS — Z992 Dependence on renal dialysis: Secondary | ICD-10-CM | POA: Diagnosis not present

## 2019-07-25 DIAGNOSIS — N2581 Secondary hyperparathyroidism of renal origin: Secondary | ICD-10-CM | POA: Diagnosis not present

## 2019-07-26 DIAGNOSIS — Z992 Dependence on renal dialysis: Secondary | ICD-10-CM | POA: Diagnosis not present

## 2019-07-26 DIAGNOSIS — D631 Anemia in chronic kidney disease: Secondary | ICD-10-CM | POA: Diagnosis not present

## 2019-07-26 DIAGNOSIS — N186 End stage renal disease: Secondary | ICD-10-CM | POA: Diagnosis not present

## 2019-07-26 DIAGNOSIS — N2581 Secondary hyperparathyroidism of renal origin: Secondary | ICD-10-CM | POA: Diagnosis not present

## 2019-07-26 DIAGNOSIS — D509 Iron deficiency anemia, unspecified: Secondary | ICD-10-CM | POA: Diagnosis not present

## 2019-07-27 DIAGNOSIS — D509 Iron deficiency anemia, unspecified: Secondary | ICD-10-CM | POA: Diagnosis not present

## 2019-07-27 DIAGNOSIS — N186 End stage renal disease: Secondary | ICD-10-CM | POA: Diagnosis not present

## 2019-07-27 DIAGNOSIS — D631 Anemia in chronic kidney disease: Secondary | ICD-10-CM | POA: Diagnosis not present

## 2019-07-27 DIAGNOSIS — N2581 Secondary hyperparathyroidism of renal origin: Secondary | ICD-10-CM | POA: Diagnosis not present

## 2019-07-27 DIAGNOSIS — Z992 Dependence on renal dialysis: Secondary | ICD-10-CM | POA: Diagnosis not present

## 2019-07-28 DIAGNOSIS — D631 Anemia in chronic kidney disease: Secondary | ICD-10-CM | POA: Diagnosis not present

## 2019-07-28 DIAGNOSIS — N2581 Secondary hyperparathyroidism of renal origin: Secondary | ICD-10-CM | POA: Diagnosis not present

## 2019-07-28 DIAGNOSIS — Z992 Dependence on renal dialysis: Secondary | ICD-10-CM | POA: Diagnosis not present

## 2019-07-28 DIAGNOSIS — N186 End stage renal disease: Secondary | ICD-10-CM | POA: Diagnosis not present

## 2019-07-28 DIAGNOSIS — D509 Iron deficiency anemia, unspecified: Secondary | ICD-10-CM | POA: Diagnosis not present

## 2019-07-29 DIAGNOSIS — D509 Iron deficiency anemia, unspecified: Secondary | ICD-10-CM | POA: Diagnosis not present

## 2019-07-29 DIAGNOSIS — N186 End stage renal disease: Secondary | ICD-10-CM | POA: Diagnosis not present

## 2019-07-29 DIAGNOSIS — Z992 Dependence on renal dialysis: Secondary | ICD-10-CM | POA: Diagnosis not present

## 2019-07-29 DIAGNOSIS — D631 Anemia in chronic kidney disease: Secondary | ICD-10-CM | POA: Diagnosis not present

## 2019-07-29 DIAGNOSIS — N2581 Secondary hyperparathyroidism of renal origin: Secondary | ICD-10-CM | POA: Diagnosis not present

## 2019-07-30 DIAGNOSIS — Z992 Dependence on renal dialysis: Secondary | ICD-10-CM | POA: Diagnosis not present

## 2019-07-30 DIAGNOSIS — D509 Iron deficiency anemia, unspecified: Secondary | ICD-10-CM | POA: Diagnosis not present

## 2019-07-30 DIAGNOSIS — N186 End stage renal disease: Secondary | ICD-10-CM | POA: Diagnosis not present

## 2019-07-30 DIAGNOSIS — D631 Anemia in chronic kidney disease: Secondary | ICD-10-CM | POA: Diagnosis not present

## 2019-07-30 DIAGNOSIS — N2581 Secondary hyperparathyroidism of renal origin: Secondary | ICD-10-CM | POA: Diagnosis not present

## 2019-07-31 DIAGNOSIS — D631 Anemia in chronic kidney disease: Secondary | ICD-10-CM | POA: Diagnosis not present

## 2019-07-31 DIAGNOSIS — D509 Iron deficiency anemia, unspecified: Secondary | ICD-10-CM | POA: Diagnosis not present

## 2019-07-31 DIAGNOSIS — N2581 Secondary hyperparathyroidism of renal origin: Secondary | ICD-10-CM | POA: Diagnosis not present

## 2019-07-31 DIAGNOSIS — Z992 Dependence on renal dialysis: Secondary | ICD-10-CM | POA: Diagnosis not present

## 2019-07-31 DIAGNOSIS — N186 End stage renal disease: Secondary | ICD-10-CM | POA: Diagnosis not present

## 2019-08-01 DIAGNOSIS — D509 Iron deficiency anemia, unspecified: Secondary | ICD-10-CM | POA: Diagnosis not present

## 2019-08-01 DIAGNOSIS — D631 Anemia in chronic kidney disease: Secondary | ICD-10-CM | POA: Diagnosis not present

## 2019-08-01 DIAGNOSIS — Z992 Dependence on renal dialysis: Secondary | ICD-10-CM | POA: Diagnosis not present

## 2019-08-01 DIAGNOSIS — N2581 Secondary hyperparathyroidism of renal origin: Secondary | ICD-10-CM | POA: Diagnosis not present

## 2019-08-01 DIAGNOSIS — N186 End stage renal disease: Secondary | ICD-10-CM | POA: Diagnosis not present

## 2019-08-02 DIAGNOSIS — D631 Anemia in chronic kidney disease: Secondary | ICD-10-CM | POA: Diagnosis not present

## 2019-08-02 DIAGNOSIS — N186 End stage renal disease: Secondary | ICD-10-CM | POA: Diagnosis not present

## 2019-08-02 DIAGNOSIS — D509 Iron deficiency anemia, unspecified: Secondary | ICD-10-CM | POA: Diagnosis not present

## 2019-08-02 DIAGNOSIS — Z992 Dependence on renal dialysis: Secondary | ICD-10-CM | POA: Diagnosis not present

## 2019-08-02 DIAGNOSIS — N2581 Secondary hyperparathyroidism of renal origin: Secondary | ICD-10-CM | POA: Diagnosis not present

## 2019-08-03 DIAGNOSIS — Z992 Dependence on renal dialysis: Secondary | ICD-10-CM | POA: Diagnosis not present

## 2019-08-03 DIAGNOSIS — D631 Anemia in chronic kidney disease: Secondary | ICD-10-CM | POA: Diagnosis not present

## 2019-08-03 DIAGNOSIS — D509 Iron deficiency anemia, unspecified: Secondary | ICD-10-CM | POA: Diagnosis not present

## 2019-08-03 DIAGNOSIS — N2581 Secondary hyperparathyroidism of renal origin: Secondary | ICD-10-CM | POA: Diagnosis not present

## 2019-08-03 DIAGNOSIS — N186 End stage renal disease: Secondary | ICD-10-CM | POA: Diagnosis not present

## 2019-08-04 DIAGNOSIS — Z992 Dependence on renal dialysis: Secondary | ICD-10-CM | POA: Diagnosis not present

## 2019-08-04 DIAGNOSIS — D631 Anemia in chronic kidney disease: Secondary | ICD-10-CM | POA: Diagnosis not present

## 2019-08-04 DIAGNOSIS — N186 End stage renal disease: Secondary | ICD-10-CM | POA: Diagnosis not present

## 2019-08-04 DIAGNOSIS — N2581 Secondary hyperparathyroidism of renal origin: Secondary | ICD-10-CM | POA: Diagnosis not present

## 2019-08-04 DIAGNOSIS — D509 Iron deficiency anemia, unspecified: Secondary | ICD-10-CM | POA: Diagnosis not present

## 2019-08-05 DIAGNOSIS — N2581 Secondary hyperparathyroidism of renal origin: Secondary | ICD-10-CM | POA: Diagnosis not present

## 2019-08-05 DIAGNOSIS — N186 End stage renal disease: Secondary | ICD-10-CM | POA: Diagnosis not present

## 2019-08-05 DIAGNOSIS — D509 Iron deficiency anemia, unspecified: Secondary | ICD-10-CM | POA: Diagnosis not present

## 2019-08-05 DIAGNOSIS — D631 Anemia in chronic kidney disease: Secondary | ICD-10-CM | POA: Diagnosis not present

## 2019-08-05 DIAGNOSIS — Z992 Dependence on renal dialysis: Secondary | ICD-10-CM | POA: Diagnosis not present

## 2019-08-06 DIAGNOSIS — D509 Iron deficiency anemia, unspecified: Secondary | ICD-10-CM | POA: Diagnosis not present

## 2019-08-06 DIAGNOSIS — N186 End stage renal disease: Secondary | ICD-10-CM | POA: Diagnosis not present

## 2019-08-06 DIAGNOSIS — Z992 Dependence on renal dialysis: Secondary | ICD-10-CM | POA: Diagnosis not present

## 2019-08-06 DIAGNOSIS — D631 Anemia in chronic kidney disease: Secondary | ICD-10-CM | POA: Diagnosis not present

## 2019-08-06 DIAGNOSIS — N2581 Secondary hyperparathyroidism of renal origin: Secondary | ICD-10-CM | POA: Diagnosis not present

## 2019-08-07 DIAGNOSIS — D509 Iron deficiency anemia, unspecified: Secondary | ICD-10-CM | POA: Diagnosis not present

## 2019-08-07 DIAGNOSIS — N186 End stage renal disease: Secondary | ICD-10-CM | POA: Diagnosis not present

## 2019-08-07 DIAGNOSIS — N2581 Secondary hyperparathyroidism of renal origin: Secondary | ICD-10-CM | POA: Diagnosis not present

## 2019-08-07 DIAGNOSIS — Z992 Dependence on renal dialysis: Secondary | ICD-10-CM | POA: Diagnosis not present

## 2019-08-07 DIAGNOSIS — D631 Anemia in chronic kidney disease: Secondary | ICD-10-CM | POA: Diagnosis not present

## 2019-08-08 DIAGNOSIS — N2581 Secondary hyperparathyroidism of renal origin: Secondary | ICD-10-CM | POA: Diagnosis not present

## 2019-08-08 DIAGNOSIS — D631 Anemia in chronic kidney disease: Secondary | ICD-10-CM | POA: Diagnosis not present

## 2019-08-08 DIAGNOSIS — N186 End stage renal disease: Secondary | ICD-10-CM | POA: Diagnosis not present

## 2019-08-08 DIAGNOSIS — D509 Iron deficiency anemia, unspecified: Secondary | ICD-10-CM | POA: Diagnosis not present

## 2019-08-08 DIAGNOSIS — Z992 Dependence on renal dialysis: Secondary | ICD-10-CM | POA: Diagnosis not present

## 2019-08-09 DIAGNOSIS — Z992 Dependence on renal dialysis: Secondary | ICD-10-CM | POA: Diagnosis not present

## 2019-08-09 DIAGNOSIS — D509 Iron deficiency anemia, unspecified: Secondary | ICD-10-CM | POA: Diagnosis not present

## 2019-08-09 DIAGNOSIS — N2581 Secondary hyperparathyroidism of renal origin: Secondary | ICD-10-CM | POA: Diagnosis not present

## 2019-08-09 DIAGNOSIS — D631 Anemia in chronic kidney disease: Secondary | ICD-10-CM | POA: Diagnosis not present

## 2019-08-09 DIAGNOSIS — N186 End stage renal disease: Secondary | ICD-10-CM | POA: Diagnosis not present

## 2019-08-10 DIAGNOSIS — N186 End stage renal disease: Secondary | ICD-10-CM | POA: Diagnosis not present

## 2019-08-10 DIAGNOSIS — D631 Anemia in chronic kidney disease: Secondary | ICD-10-CM | POA: Diagnosis not present

## 2019-08-10 DIAGNOSIS — N2581 Secondary hyperparathyroidism of renal origin: Secondary | ICD-10-CM | POA: Diagnosis not present

## 2019-08-10 DIAGNOSIS — Z992 Dependence on renal dialysis: Secondary | ICD-10-CM | POA: Diagnosis not present

## 2019-08-10 DIAGNOSIS — D509 Iron deficiency anemia, unspecified: Secondary | ICD-10-CM | POA: Diagnosis not present

## 2019-08-11 DIAGNOSIS — D631 Anemia in chronic kidney disease: Secondary | ICD-10-CM | POA: Diagnosis not present

## 2019-08-11 DIAGNOSIS — N2581 Secondary hyperparathyroidism of renal origin: Secondary | ICD-10-CM | POA: Diagnosis not present

## 2019-08-11 DIAGNOSIS — N186 End stage renal disease: Secondary | ICD-10-CM | POA: Diagnosis not present

## 2019-08-11 DIAGNOSIS — D509 Iron deficiency anemia, unspecified: Secondary | ICD-10-CM | POA: Diagnosis not present

## 2019-08-11 DIAGNOSIS — Z992 Dependence on renal dialysis: Secondary | ICD-10-CM | POA: Diagnosis not present

## 2019-08-12 DIAGNOSIS — D509 Iron deficiency anemia, unspecified: Secondary | ICD-10-CM | POA: Diagnosis not present

## 2019-08-12 DIAGNOSIS — D631 Anemia in chronic kidney disease: Secondary | ICD-10-CM | POA: Diagnosis not present

## 2019-08-12 DIAGNOSIS — Z992 Dependence on renal dialysis: Secondary | ICD-10-CM | POA: Diagnosis not present

## 2019-08-12 DIAGNOSIS — N2581 Secondary hyperparathyroidism of renal origin: Secondary | ICD-10-CM | POA: Diagnosis not present

## 2019-08-12 DIAGNOSIS — N186 End stage renal disease: Secondary | ICD-10-CM | POA: Diagnosis not present

## 2019-08-13 DIAGNOSIS — D509 Iron deficiency anemia, unspecified: Secondary | ICD-10-CM | POA: Diagnosis not present

## 2019-08-13 DIAGNOSIS — N2581 Secondary hyperparathyroidism of renal origin: Secondary | ICD-10-CM | POA: Diagnosis not present

## 2019-08-13 DIAGNOSIS — D631 Anemia in chronic kidney disease: Secondary | ICD-10-CM | POA: Diagnosis not present

## 2019-08-13 DIAGNOSIS — Z992 Dependence on renal dialysis: Secondary | ICD-10-CM | POA: Diagnosis not present

## 2019-08-13 DIAGNOSIS — N186 End stage renal disease: Secondary | ICD-10-CM | POA: Diagnosis not present

## 2019-08-14 DIAGNOSIS — N186 End stage renal disease: Secondary | ICD-10-CM | POA: Diagnosis not present

## 2019-08-14 DIAGNOSIS — Z992 Dependence on renal dialysis: Secondary | ICD-10-CM | POA: Diagnosis not present

## 2019-08-14 DIAGNOSIS — N2581 Secondary hyperparathyroidism of renal origin: Secondary | ICD-10-CM | POA: Diagnosis not present

## 2019-08-14 DIAGNOSIS — D631 Anemia in chronic kidney disease: Secondary | ICD-10-CM | POA: Diagnosis not present

## 2019-08-14 DIAGNOSIS — D509 Iron deficiency anemia, unspecified: Secondary | ICD-10-CM | POA: Diagnosis not present

## 2019-08-15 DIAGNOSIS — D631 Anemia in chronic kidney disease: Secondary | ICD-10-CM | POA: Diagnosis not present

## 2019-08-15 DIAGNOSIS — N186 End stage renal disease: Secondary | ICD-10-CM | POA: Diagnosis not present

## 2019-08-15 DIAGNOSIS — N2581 Secondary hyperparathyroidism of renal origin: Secondary | ICD-10-CM | POA: Diagnosis not present

## 2019-08-15 DIAGNOSIS — Z992 Dependence on renal dialysis: Secondary | ICD-10-CM | POA: Diagnosis not present

## 2019-08-15 DIAGNOSIS — D509 Iron deficiency anemia, unspecified: Secondary | ICD-10-CM | POA: Diagnosis not present

## 2019-08-16 DIAGNOSIS — N186 End stage renal disease: Secondary | ICD-10-CM | POA: Diagnosis not present

## 2019-08-16 DIAGNOSIS — D509 Iron deficiency anemia, unspecified: Secondary | ICD-10-CM | POA: Diagnosis not present

## 2019-08-16 DIAGNOSIS — D631 Anemia in chronic kidney disease: Secondary | ICD-10-CM | POA: Diagnosis not present

## 2019-08-16 DIAGNOSIS — Z992 Dependence on renal dialysis: Secondary | ICD-10-CM | POA: Diagnosis not present

## 2019-08-16 DIAGNOSIS — N2581 Secondary hyperparathyroidism of renal origin: Secondary | ICD-10-CM | POA: Diagnosis not present

## 2019-08-17 DIAGNOSIS — N2581 Secondary hyperparathyroidism of renal origin: Secondary | ICD-10-CM | POA: Diagnosis not present

## 2019-08-17 DIAGNOSIS — Z992 Dependence on renal dialysis: Secondary | ICD-10-CM | POA: Diagnosis not present

## 2019-08-17 DIAGNOSIS — N186 End stage renal disease: Secondary | ICD-10-CM | POA: Diagnosis not present

## 2019-08-17 DIAGNOSIS — D509 Iron deficiency anemia, unspecified: Secondary | ICD-10-CM | POA: Diagnosis not present

## 2019-08-17 DIAGNOSIS — D631 Anemia in chronic kidney disease: Secondary | ICD-10-CM | POA: Diagnosis not present

## 2019-08-18 DIAGNOSIS — D509 Iron deficiency anemia, unspecified: Secondary | ICD-10-CM | POA: Diagnosis not present

## 2019-08-18 DIAGNOSIS — D631 Anemia in chronic kidney disease: Secondary | ICD-10-CM | POA: Diagnosis not present

## 2019-08-18 DIAGNOSIS — N186 End stage renal disease: Secondary | ICD-10-CM | POA: Diagnosis not present

## 2019-08-18 DIAGNOSIS — Z992 Dependence on renal dialysis: Secondary | ICD-10-CM | POA: Diagnosis not present

## 2019-08-19 DIAGNOSIS — D631 Anemia in chronic kidney disease: Secondary | ICD-10-CM | POA: Diagnosis not present

## 2019-08-19 DIAGNOSIS — D509 Iron deficiency anemia, unspecified: Secondary | ICD-10-CM | POA: Diagnosis not present

## 2019-08-19 DIAGNOSIS — N186 End stage renal disease: Secondary | ICD-10-CM | POA: Diagnosis not present

## 2019-08-19 DIAGNOSIS — Z992 Dependence on renal dialysis: Secondary | ICD-10-CM | POA: Diagnosis not present

## 2019-08-20 DIAGNOSIS — Z992 Dependence on renal dialysis: Secondary | ICD-10-CM | POA: Diagnosis not present

## 2019-08-20 DIAGNOSIS — D509 Iron deficiency anemia, unspecified: Secondary | ICD-10-CM | POA: Diagnosis not present

## 2019-08-20 DIAGNOSIS — D631 Anemia in chronic kidney disease: Secondary | ICD-10-CM | POA: Diagnosis not present

## 2019-08-20 DIAGNOSIS — N186 End stage renal disease: Secondary | ICD-10-CM | POA: Diagnosis not present

## 2019-08-21 DIAGNOSIS — N186 End stage renal disease: Secondary | ICD-10-CM | POA: Diagnosis not present

## 2019-08-21 DIAGNOSIS — D509 Iron deficiency anemia, unspecified: Secondary | ICD-10-CM | POA: Diagnosis not present

## 2019-08-21 DIAGNOSIS — Z992 Dependence on renal dialysis: Secondary | ICD-10-CM | POA: Diagnosis not present

## 2019-08-21 DIAGNOSIS — D631 Anemia in chronic kidney disease: Secondary | ICD-10-CM | POA: Diagnosis not present

## 2019-08-22 DIAGNOSIS — N186 End stage renal disease: Secondary | ICD-10-CM | POA: Diagnosis not present

## 2019-08-22 DIAGNOSIS — D631 Anemia in chronic kidney disease: Secondary | ICD-10-CM | POA: Diagnosis not present

## 2019-08-22 DIAGNOSIS — Z992 Dependence on renal dialysis: Secondary | ICD-10-CM | POA: Diagnosis not present

## 2019-08-22 DIAGNOSIS — D509 Iron deficiency anemia, unspecified: Secondary | ICD-10-CM | POA: Diagnosis not present

## 2019-08-23 DIAGNOSIS — D631 Anemia in chronic kidney disease: Secondary | ICD-10-CM | POA: Diagnosis not present

## 2019-08-23 DIAGNOSIS — Z992 Dependence on renal dialysis: Secondary | ICD-10-CM | POA: Diagnosis not present

## 2019-08-23 DIAGNOSIS — N186 End stage renal disease: Secondary | ICD-10-CM | POA: Diagnosis not present

## 2019-08-23 DIAGNOSIS — D509 Iron deficiency anemia, unspecified: Secondary | ICD-10-CM | POA: Diagnosis not present

## 2019-08-24 DIAGNOSIS — D509 Iron deficiency anemia, unspecified: Secondary | ICD-10-CM | POA: Diagnosis not present

## 2019-08-24 DIAGNOSIS — Z992 Dependence on renal dialysis: Secondary | ICD-10-CM | POA: Diagnosis not present

## 2019-08-24 DIAGNOSIS — D631 Anemia in chronic kidney disease: Secondary | ICD-10-CM | POA: Diagnosis not present

## 2019-08-24 DIAGNOSIS — N186 End stage renal disease: Secondary | ICD-10-CM | POA: Diagnosis not present

## 2019-08-25 ENCOUNTER — Other Ambulatory Visit: Payer: Self-pay

## 2019-08-25 ENCOUNTER — Ambulatory Visit (INDEPENDENT_AMBULATORY_CARE_PROVIDER_SITE_OTHER): Payer: Medicare Other | Admitting: Orthopedic Surgery

## 2019-08-25 ENCOUNTER — Telehealth: Payer: Self-pay

## 2019-08-25 ENCOUNTER — Ambulatory Visit: Payer: Medicare Other

## 2019-08-25 VITALS — BP 98/68 | HR 67 | Temp 98.2°F | Ht 64.0 in | Wt 241.0 lb

## 2019-08-25 DIAGNOSIS — D631 Anemia in chronic kidney disease: Secondary | ICD-10-CM | POA: Diagnosis not present

## 2019-08-25 DIAGNOSIS — G8929 Other chronic pain: Secondary | ICD-10-CM

## 2019-08-25 DIAGNOSIS — Z6841 Body Mass Index (BMI) 40.0 and over, adult: Secondary | ICD-10-CM

## 2019-08-25 DIAGNOSIS — M25512 Pain in left shoulder: Secondary | ICD-10-CM | POA: Diagnosis not present

## 2019-08-25 DIAGNOSIS — N186 End stage renal disease: Secondary | ICD-10-CM | POA: Diagnosis not present

## 2019-08-25 DIAGNOSIS — Z992 Dependence on renal dialysis: Secondary | ICD-10-CM | POA: Diagnosis not present

## 2019-08-25 DIAGNOSIS — M009 Pyogenic arthritis, unspecified: Secondary | ICD-10-CM

## 2019-08-25 DIAGNOSIS — D509 Iron deficiency anemia, unspecified: Secondary | ICD-10-CM | POA: Diagnosis not present

## 2019-08-25 NOTE — Telephone Encounter (Signed)
I was not sure he could have it at [place] due to implant, but scheduling has indicated is fine there Sent order

## 2019-08-25 NOTE — Progress Notes (Signed)
[name] [name]  08/25/2019  Body mass index is 41.37 kg/m.   HISTORY SECTION :  Chief Complaint  Patient presents with  . Shoulder Pain    Left shoulder pain, no injury.   HPI The patient presents for evaluation of  (mild/moderate/severe/ ) moderate left shoulder  pain, for 3 months no trauma associated with a mass on the superior aspect of the acromioclavicular joint.  The patient has an AV fistula on the left side and this left upper extremity has had some numbness and tingling in the left hand  Review of Systems  Respiratory: Positive for shortness of breath and wheezing.   Musculoskeletal: Positive for back pain, joint pain and neck pain.     has a past medical history of Anemia, Arthritis, Asthma, Bell palsy, CAD (coronary artery disease), Chronic back pain, Chronic knee pain, Chronic pain, Chronic shoulder pain, Chronic sinusitis, CKD (chronic kidney disease), stage IV ([place]), COPD (chronic obstructive pulmonary disease) ([place]), Diabetes mellitus without complication ([place]), Essential hypertension, GERD (gastroesophageal reflux disease), Gout, Gout, Hepatomegaly, History of hiatal hernia, Hyperlipidemia, Lateral meniscus tear, Obesity, Obstructive sleep apnea, On home oxygen therapy, PUD (peptic ulcer disease), Reactive airway disease, Renal insufficiency, Sinusitis, and Vitamin D deficiency.   Past Surgical History:  Procedure Laterality Date  . ASAD LT SHOULDER  12/2008   left shoulder  . AV FISTULA PLACEMENT Left 08/09/2016   Procedure: BRACHIOCEPHALIC ARTERIOVENOUS (AV) FISTULA CREATION LEFT ARM;  Surgeon: [name], MD;  Location: [facility];  Service: Vascular;  Laterality: Left;  . CAPD INSERTION N/A 10/07/2018   Procedure: LAPAROSCOPIC PERITONEAL CATHETER PLACEMENT;  Surgeon: Kieth Brightly Arta Bruce, MD;  [facility];  Service: General;  Laterality: N/A;  . CATARACT EXTRACTION W/PHACO Left 03/28/2016   Procedure: CATARACT EXTRACTION PHACO AND INTRAOCULAR LENS PLACEMENT LEFT  EYE;  Surgeon: [name], MD;  [facility];  Service: Ophthalmology;  Laterality: Left;  CDE: 4.77  . CATARACT EXTRACTION W/PHACO Right 04/11/2016   Procedure: CATARACT EXTRACTION PHACO AND INTRAOCULAR LENS PLACEMENT RIGHT EYE; CDE:  4.74;  Surgeon: [name], MD;  [facility];  Service: Ophthalmology;  Laterality: Right;  . COLONOSCOPY  10/2008   Fields: Rectal polyp obliterated, not retrieved, hemorrhoids, single ascending colon diverticulum near the CV. Next colonoscopy April 2020  . COLONOSCOPY N/A 12/25/2014   SLF: 1. Colorectal polyps (2) removed 2. Small internal hemorrhoids 3. the left colon is severely redundant  . DOPPLER ECHOCARDIOGRAPHY    . ESOPHAGOGASTRODUODENOSCOPY N/A 12/25/2014   SLF: 1. Anemia most likely due to CRI, gastritis, gastric polyps 2. Moderate non-erosive gastriits and mild duodenitis.  3.TWo large gstric polyps removed.   Marland Kitchen EYE SURGERY  12/22/2010   tear duct probing-[place]  . FOREIGN BODY REMOVAL  03/29/2011   Procedure: REMOVAL FOREIGN BODY EXTREMITY;  Surgeon: Arther Abbott, MD;  [facility];  Service: Orthopedics;  Laterality: Right;  Removal Foreign Body Right Thumb  . IR FLUORO GUIDE CV LINE RIGHT  08/06/2018  . IR US GUIDE VASC ACCESS RIGHT  08/06/2018  . KNEE ARTHROSCOPY  10/2007   left  . KNEE ARTHROSCOPY WITH LATERAL MENISECTOMY Right 10/14/2015   Procedure: LEFT KNEE ARTHROSCOPY WITH PARTIAL LATERAL MENISECTOMY;  Surgeon: Carole Civil, MD;  [facility];  Service: Orthopedics;  Laterality: Right;  . LEFT HEART CATHETERIZATION WITH CORONARY ANGIOGRAM N/A 03/28/2013   Procedure: LEFT HEART CATHETERIZATION WITH CORONARY ANGIOGRAM;  Surgeon: Leonie Man, MD;  Location: [facility] CATH [facility];  Service: Cardiovascular;  Laterality: N/A;  .  NM MYOVIEW LTD    . PENILE PROSTHESIS IMPLANT N/A 08/16/2015   Procedure: PENILE PROTHESIS INFLATABLE, three piece, Excisional biopsy of Penile ulcer, Penile molding;  Surgeon: Carolan Clines, MD;   [facility];  Service: Urology;  Laterality: N/A;  . PENILE PROSTHESIS IMPLANT N/A 12/24/2017   Procedure: REMOVAL AND  REPLACEMENT  COLOPLAST PENILE PROSTHESIS;  Surgeon: Lucas Mallow, MD;  [facility];  Service: Urology;  Laterality: N/A;  . QUADRICEPS TENDON REPAIR  07/21/2011   Procedure: REPAIR QUADRICEP TENDON;  Surgeon: Arther Abbott, MD;  [facility];  Service: Orthopedics;  Laterality: Right;  . TOENAIL EXCISION     removed [id number]  . UMBILICAL HERNIA REPAIR  2007   [place]    Body mass index is 41.37 kg/m.   Allergies  Allergen Reactions  . Tramadol Hcl   . Opana [Oxymorphone Hcl] Itching  . Oxymorphone Itching  . Tramadol Itching     Current Outpatient Medications:  .  allopurinol (ZYLOPRIM) 100 MG tablet, Take 200 mg by mouth daily. , Disp: , Rfl:  .  amLODipine (NORVASC) 10 MG tablet, Take 10 mg by mouth daily., Disp: , Rfl:  .  aspirin EC 81 MG tablet, Take 1 tablet (81 mg total) by mouth daily., Disp: 90 tablet, Rfl: 3 .  budesonide-formoterol (SYMBICORT) 160-4.5 MCG/ACT inhaler, Inhale 2 puffs into the lungs 2 (two) times daily as needed (Shortness of Breath). , Disp: , Rfl:  .  calcitRIOL (ROCALTROL) 0.25 MCG capsule, Take 0.25 mcg by mouth daily., Disp: , Rfl:  .  cetirizine (ZYRTEC) 10 MG tablet, Take 10 mg by mouth at bedtime as needed for allergies. , Disp: , Rfl:  .  DROPLET PEN NEEDLES 31G X 5 MM MISC, INJECT SUBCUTANEOUSLY TWICE DAILY AS DIRECTED, Disp: 200 each, Rfl: 2 .  fluticasone (FLONASE) 50 MCG/ACT nasal spray, Place 2 sprays into both nostrils daily., Disp: 16 g, Rfl: 0 .  folic acid (FOLVITE) 1 MG tablet, Take 1 tablet by mouth daily., Disp: , Rfl:  .  hydrALAZINE (APRESOLINE) 50 MG tablet, Take 1 tablet (50 mg total) by mouth 3 (three) times daily., Disp: 270 tablet, Rfl: 3 .  Insulin Glargine (LANTUS SOLOSTAR) 100 UNIT/ML Solostar Pen, INJECT 30 UNITS SUBCUTANEOUSLY AT BEDTIME, Disp: 30 mL, Rfl: 1 .  loratadine  (CLARITIN) 10 MG tablet, Take 1 tablet (10 mg total) by mouth daily as needed for rhinitis or itching., Disp: 20 tablet, Rfl: 0 .  mometasone (NASONEX) 50 MCG/ACT nasal spray, Place 2 sprays into the nose 2 (two) times daily as needed (allergies). , Disp: , Rfl:  .  pantoprazole (PROTONIX) 40 MG tablet, TAKE 1 TABLET TWICE DAILY 30 MINUTES PRIOR TO MEALS, Disp: 180 tablet, Rfl: 3 .  simvastatin (ZOCOR) 20 MG tablet, Take 20 mg by mouth every morning. , Disp: , Rfl:  .  TRADJENTA 5 MG TABS tablet, TAKE 1 TABLET EVERY DAY, Disp: 90 tablet, Rfl: 0 .  amoxicillin (AMOXIL) 500 MG capsule, Take 1 capsule (500 mg total) by mouth 2 (two) times daily. (Patient not taking: Reported on 08/25/2019), Disp: 20 capsule, Rfl: 0 .  clindamycin (CLEOCIN) 150 MG capsule, Take 3 capsules (450 mg total) by mouth 3 (three) times daily. X 7 days (Patient not taking: Reported on 08/25/2019), Disp: 63 capsule, Rfl: 0 .  HYDROcodone-acetaminophen (NORCO/VICODIN) 5-325 MG tablet, Take 1 tablet by mouth every 6 (six) hours as needed for severe pain. (Patient not taking: Reported on 08/25/2019), Disp: 10 tablet,  Rfl: 0   PHYSICAL EXAM SECTION: 1) BP 98/68   Pulse 67   Temp 98.2 F (36.8 C)   Ht 5\' 4"  (1.626 m)   Wt 241 lb (109.3 kg)   BMI 41.37 kg/m   Body mass index is 41.37 kg/m. General appearance: Well-developed well-nourished no gross deformities  2) Cardiovascular normal pulse and perfusion , normal color   3) Neurologically deep tendon reflexes are equal and normal, no sensation loss or deficits no pathologic reflexes  4) Psychological: Awake alert and oriented x3 mood and affect normal  5) Skin no lacerations or ulcerations no nodularity no palpable masses, no erythema or nodularity  6) Musculoskeletal:   Skin right shoulder looks normal neurovascular exam is intact range of motion is normal strength is excellent shoulder stable muscle tone normal no pain tenderness or swelling  Left shoulder is a mass over  the [facility] joint it is tender no redness or warmth to the skin range of motion is normal mild pain reaching across his chest muscle tone looks good shoulder stable normal range of motion   MEDICAL DECISION MAKING  A.  Encounter Diagnoses  Name Primary?  . Chronic left shoulder pain Yes  . Body mass index 40.0-44.9, adult ([place])   . Morbid obesity ([place])   . Pyogenic arthritis of left shoulder region, due to unspecified organism [facility])   The patient meets the AMA guidelines for Morbid (severe) obesity with a BMI > 40.0 and I have recommended weight loss.  B. DATA ANALYSED:  IMAGING: Independent interpretation of images: Office images were obtained AC joint looks obliterated and prominent distal clavicle  Orders: MRI left shoulder  Outside records reviewed: None C. MANAGEMENT call patient after MRI  Patient is on dialysis has history of infectious arthritis of the [facility] joint must rule that out   No orders of the defined types were placed in this encounter.  Acute complicated illness with MRI moderate complexity MRI risk low   Arther Abbott, MD  08/25/2019 9:37 AM

## 2019-08-25 NOTE — Addendum Note (Signed)
Addended byCandice Camp on: 08/25/2019 09:55 AM   Modules accepted: Orders

## 2019-08-25 NOTE — Patient Instructions (Addendum)
Weight loss to maintain BMI less than 40  You have been scheduled for an MRI scan  We will call your insurance company to do a precertification to get the MRI covered  You will receive a phone call regarding the date of the scan  Dr Aline Brochure will call you with the results

## 2019-08-25 NOTE — Telephone Encounter (Signed)
Patient called stating he told Dr. Aline Brochure that he is claustrophobic. Stated he received a call from [facility] to schedule an appointment, but he wants to go to [facility] not [facility].  Please call and advise

## 2019-08-25 NOTE — Addendum Note (Signed)
Addended byCandice Camp on: 08/25/2019 02:55 PM   Modules accepted: Orders

## 2019-08-26 DIAGNOSIS — D509 Iron deficiency anemia, unspecified: Secondary | ICD-10-CM | POA: Diagnosis not present

## 2019-08-26 DIAGNOSIS — Z992 Dependence on renal dialysis: Secondary | ICD-10-CM | POA: Diagnosis not present

## 2019-08-26 DIAGNOSIS — N186 End stage renal disease: Secondary | ICD-10-CM | POA: Diagnosis not present

## 2019-08-26 DIAGNOSIS — D631 Anemia in chronic kidney disease: Secondary | ICD-10-CM | POA: Diagnosis not present

## 2019-08-27 DIAGNOSIS — Z992 Dependence on renal dialysis: Secondary | ICD-10-CM | POA: Diagnosis not present

## 2019-08-27 DIAGNOSIS — D631 Anemia in chronic kidney disease: Secondary | ICD-10-CM | POA: Diagnosis not present

## 2019-08-27 DIAGNOSIS — N186 End stage renal disease: Secondary | ICD-10-CM | POA: Diagnosis not present

## 2019-08-27 DIAGNOSIS — D509 Iron deficiency anemia, unspecified: Secondary | ICD-10-CM | POA: Diagnosis not present

## 2019-08-28 DIAGNOSIS — N186 End stage renal disease: Secondary | ICD-10-CM | POA: Diagnosis not present

## 2019-08-28 DIAGNOSIS — Z992 Dependence on renal dialysis: Secondary | ICD-10-CM | POA: Diagnosis not present

## 2019-08-28 DIAGNOSIS — D631 Anemia in chronic kidney disease: Secondary | ICD-10-CM | POA: Diagnosis not present

## 2019-08-28 DIAGNOSIS — D509 Iron deficiency anemia, unspecified: Secondary | ICD-10-CM | POA: Diagnosis not present

## 2019-08-29 DIAGNOSIS — Z992 Dependence on renal dialysis: Secondary | ICD-10-CM | POA: Diagnosis not present

## 2019-08-29 DIAGNOSIS — D631 Anemia in chronic kidney disease: Secondary | ICD-10-CM | POA: Diagnosis not present

## 2019-08-29 DIAGNOSIS — D509 Iron deficiency anemia, unspecified: Secondary | ICD-10-CM | POA: Diagnosis not present

## 2019-08-29 DIAGNOSIS — N186 End stage renal disease: Secondary | ICD-10-CM | POA: Diagnosis not present

## 2019-08-30 DIAGNOSIS — Z992 Dependence on renal dialysis: Secondary | ICD-10-CM | POA: Diagnosis not present

## 2019-08-30 DIAGNOSIS — N186 End stage renal disease: Secondary | ICD-10-CM | POA: Diagnosis not present

## 2019-08-30 DIAGNOSIS — D509 Iron deficiency anemia, unspecified: Secondary | ICD-10-CM | POA: Diagnosis not present

## 2019-08-30 DIAGNOSIS — D631 Anemia in chronic kidney disease: Secondary | ICD-10-CM | POA: Diagnosis not present

## 2019-08-31 DIAGNOSIS — N186 End stage renal disease: Secondary | ICD-10-CM | POA: Diagnosis not present

## 2019-08-31 DIAGNOSIS — D631 Anemia in chronic kidney disease: Secondary | ICD-10-CM | POA: Diagnosis not present

## 2019-08-31 DIAGNOSIS — Z992 Dependence on renal dialysis: Secondary | ICD-10-CM | POA: Diagnosis not present

## 2019-08-31 DIAGNOSIS — D509 Iron deficiency anemia, unspecified: Secondary | ICD-10-CM | POA: Diagnosis not present

## 2019-09-01 DIAGNOSIS — D509 Iron deficiency anemia, unspecified: Secondary | ICD-10-CM | POA: Diagnosis not present

## 2019-09-01 DIAGNOSIS — Z992 Dependence on renal dialysis: Secondary | ICD-10-CM | POA: Diagnosis not present

## 2019-09-01 DIAGNOSIS — D631 Anemia in chronic kidney disease: Secondary | ICD-10-CM | POA: Diagnosis not present

## 2019-09-01 DIAGNOSIS — N186 End stage renal disease: Secondary | ICD-10-CM | POA: Diagnosis not present

## 2019-09-02 DIAGNOSIS — D631 Anemia in chronic kidney disease: Secondary | ICD-10-CM | POA: Diagnosis not present

## 2019-09-02 DIAGNOSIS — B351 Tinea unguium: Secondary | ICD-10-CM | POA: Diagnosis not present

## 2019-09-02 DIAGNOSIS — Z992 Dependence on renal dialysis: Secondary | ICD-10-CM | POA: Diagnosis not present

## 2019-09-02 DIAGNOSIS — D509 Iron deficiency anemia, unspecified: Secondary | ICD-10-CM | POA: Diagnosis not present

## 2019-09-02 DIAGNOSIS — E1142 Type 2 diabetes mellitus with diabetic polyneuropathy: Secondary | ICD-10-CM | POA: Diagnosis not present

## 2019-09-02 DIAGNOSIS — N186 End stage renal disease: Secondary | ICD-10-CM | POA: Diagnosis not present

## 2019-09-03 DIAGNOSIS — Z992 Dependence on renal dialysis: Secondary | ICD-10-CM | POA: Diagnosis not present

## 2019-09-03 DIAGNOSIS — D631 Anemia in chronic kidney disease: Secondary | ICD-10-CM | POA: Diagnosis not present

## 2019-09-03 DIAGNOSIS — D509 Iron deficiency anemia, unspecified: Secondary | ICD-10-CM | POA: Diagnosis not present

## 2019-09-03 DIAGNOSIS — N186 End stage renal disease: Secondary | ICD-10-CM | POA: Diagnosis not present

## 2019-09-04 ENCOUNTER — Institutional Professional Consult (permissible substitution): Payer: Medicare Other | Admitting: Emergency Medicine

## 2019-09-04 DIAGNOSIS — D509 Iron deficiency anemia, unspecified: Secondary | ICD-10-CM | POA: Diagnosis not present

## 2019-09-04 DIAGNOSIS — D631 Anemia in chronic kidney disease: Secondary | ICD-10-CM | POA: Diagnosis not present

## 2019-09-04 DIAGNOSIS — N186 End stage renal disease: Secondary | ICD-10-CM | POA: Diagnosis not present

## 2019-09-04 DIAGNOSIS — Z992 Dependence on renal dialysis: Secondary | ICD-10-CM | POA: Diagnosis not present

## 2019-09-05 DIAGNOSIS — N186 End stage renal disease: Secondary | ICD-10-CM | POA: Diagnosis not present

## 2019-09-05 DIAGNOSIS — D509 Iron deficiency anemia, unspecified: Secondary | ICD-10-CM | POA: Diagnosis not present

## 2019-09-05 DIAGNOSIS — Z992 Dependence on renal dialysis: Secondary | ICD-10-CM | POA: Diagnosis not present

## 2019-09-05 DIAGNOSIS — D631 Anemia in chronic kidney disease: Secondary | ICD-10-CM | POA: Diagnosis not present

## 2019-09-06 DIAGNOSIS — D509 Iron deficiency anemia, unspecified: Secondary | ICD-10-CM | POA: Diagnosis not present

## 2019-09-06 DIAGNOSIS — N186 End stage renal disease: Secondary | ICD-10-CM | POA: Diagnosis not present

## 2019-09-06 DIAGNOSIS — D631 Anemia in chronic kidney disease: Secondary | ICD-10-CM | POA: Diagnosis not present

## 2019-09-06 DIAGNOSIS — Z992 Dependence on renal dialysis: Secondary | ICD-10-CM | POA: Diagnosis not present

## 2019-09-07 DIAGNOSIS — Z992 Dependence on renal dialysis: Secondary | ICD-10-CM | POA: Diagnosis not present

## 2019-09-07 DIAGNOSIS — D631 Anemia in chronic kidney disease: Secondary | ICD-10-CM | POA: Diagnosis not present

## 2019-09-07 DIAGNOSIS — N186 End stage renal disease: Secondary | ICD-10-CM | POA: Diagnosis not present

## 2019-09-07 DIAGNOSIS — D509 Iron deficiency anemia, unspecified: Secondary | ICD-10-CM | POA: Diagnosis not present

## 2019-09-08 DIAGNOSIS — N186 End stage renal disease: Secondary | ICD-10-CM | POA: Diagnosis not present

## 2019-09-08 DIAGNOSIS — D509 Iron deficiency anemia, unspecified: Secondary | ICD-10-CM | POA: Diagnosis not present

## 2019-09-08 DIAGNOSIS — Z992 Dependence on renal dialysis: Secondary | ICD-10-CM | POA: Diagnosis not present

## 2019-09-08 DIAGNOSIS — D631 Anemia in chronic kidney disease: Secondary | ICD-10-CM | POA: Diagnosis not present

## 2019-09-09 ENCOUNTER — Inpatient Hospital Stay: Admission: RE | Admit: 2019-09-09 | Payer: Medicare Other | Source: Ambulatory Visit

## 2019-09-09 DIAGNOSIS — Z992 Dependence on renal dialysis: Secondary | ICD-10-CM | POA: Diagnosis not present

## 2019-09-09 DIAGNOSIS — D631 Anemia in chronic kidney disease: Secondary | ICD-10-CM | POA: Diagnosis not present

## 2019-09-09 DIAGNOSIS — N186 End stage renal disease: Secondary | ICD-10-CM | POA: Diagnosis not present

## 2019-09-09 DIAGNOSIS — D509 Iron deficiency anemia, unspecified: Secondary | ICD-10-CM | POA: Diagnosis not present

## 2019-09-10 ENCOUNTER — Other Ambulatory Visit (HOSPITAL_COMMUNITY): Payer: Self-pay | Admitting: Internal Medicine

## 2019-09-10 ENCOUNTER — Other Ambulatory Visit: Payer: Self-pay

## 2019-09-10 ENCOUNTER — Other Ambulatory Visit: Payer: Self-pay | Admitting: Internal Medicine

## 2019-09-10 ENCOUNTER — Ambulatory Visit
Admission: RE | Admit: 2019-09-10 | Discharge: 2019-09-10 | Disposition: A | Discharge status: Home / Self Care | Payer: Medicare Other | Source: Ambulatory Visit | Attending: Orthopedic Surgery | Admitting: Orthopedic Surgery

## 2019-09-10 DIAGNOSIS — D631 Anemia in chronic kidney disease: Secondary | ICD-10-CM | POA: Diagnosis not present

## 2019-09-10 DIAGNOSIS — M19012 Primary osteoarthritis, left shoulder: Secondary | ICD-10-CM | POA: Diagnosis not present

## 2019-09-10 DIAGNOSIS — Z992 Dependence on renal dialysis: Secondary | ICD-10-CM | POA: Diagnosis not present

## 2019-09-10 DIAGNOSIS — N186 End stage renal disease: Secondary | ICD-10-CM | POA: Diagnosis not present

## 2019-09-10 DIAGNOSIS — G8929 Other chronic pain: Secondary | ICD-10-CM

## 2019-09-10 DIAGNOSIS — R935 Abnormal findings on diagnostic imaging of other abdominal regions, including retroperitoneum: Secondary | ICD-10-CM

## 2019-09-10 DIAGNOSIS — M009 Pyogenic arthritis, unspecified: Secondary | ICD-10-CM

## 2019-09-10 DIAGNOSIS — D509 Iron deficiency anemia, unspecified: Secondary | ICD-10-CM | POA: Diagnosis not present

## 2019-09-11 DIAGNOSIS — D631 Anemia in chronic kidney disease: Secondary | ICD-10-CM | POA: Diagnosis not present

## 2019-09-11 DIAGNOSIS — D509 Iron deficiency anemia, unspecified: Secondary | ICD-10-CM | POA: Diagnosis not present

## 2019-09-11 DIAGNOSIS — N186 End stage renal disease: Secondary | ICD-10-CM | POA: Diagnosis not present

## 2019-09-11 DIAGNOSIS — Z992 Dependence on renal dialysis: Secondary | ICD-10-CM | POA: Diagnosis not present

## 2019-09-12 DIAGNOSIS — D631 Anemia in chronic kidney disease: Secondary | ICD-10-CM | POA: Diagnosis not present

## 2019-09-12 DIAGNOSIS — N186 End stage renal disease: Secondary | ICD-10-CM | POA: Diagnosis not present

## 2019-09-12 DIAGNOSIS — D509 Iron deficiency anemia, unspecified: Secondary | ICD-10-CM | POA: Diagnosis not present

## 2019-09-12 DIAGNOSIS — Z992 Dependence on renal dialysis: Secondary | ICD-10-CM | POA: Diagnosis not present

## 2019-09-13 DIAGNOSIS — N186 End stage renal disease: Secondary | ICD-10-CM | POA: Diagnosis not present

## 2019-09-13 DIAGNOSIS — D631 Anemia in chronic kidney disease: Secondary | ICD-10-CM | POA: Diagnosis not present

## 2019-09-13 DIAGNOSIS — D509 Iron deficiency anemia, unspecified: Secondary | ICD-10-CM | POA: Diagnosis not present

## 2019-09-13 DIAGNOSIS — Z992 Dependence on renal dialysis: Secondary | ICD-10-CM | POA: Diagnosis not present

## 2019-09-14 DIAGNOSIS — D509 Iron deficiency anemia, unspecified: Secondary | ICD-10-CM | POA: Diagnosis not present

## 2019-09-14 DIAGNOSIS — N186 End stage renal disease: Secondary | ICD-10-CM | POA: Diagnosis not present

## 2019-09-14 DIAGNOSIS — D631 Anemia in chronic kidney disease: Secondary | ICD-10-CM | POA: Diagnosis not present

## 2019-09-14 DIAGNOSIS — Z992 Dependence on renal dialysis: Secondary | ICD-10-CM | POA: Diagnosis not present

## 2019-09-15 ENCOUNTER — Telehealth: Payer: Self-pay | Admitting: Radiology

## 2019-09-15 ENCOUNTER — Telehealth: Payer: Self-pay | Admitting: Orthopedic Surgery

## 2019-09-15 DIAGNOSIS — Z992 Dependence on renal dialysis: Secondary | ICD-10-CM | POA: Diagnosis not present

## 2019-09-15 DIAGNOSIS — D631 Anemia in chronic kidney disease: Secondary | ICD-10-CM | POA: Diagnosis not present

## 2019-09-15 DIAGNOSIS — N186 End stage renal disease: Secondary | ICD-10-CM | POA: Diagnosis not present

## 2019-09-15 DIAGNOSIS — D509 Iron deficiency anemia, unspecified: Secondary | ICD-10-CM | POA: Diagnosis not present

## 2019-09-15 NOTE — Telephone Encounter (Signed)
I called him to make appt

## 2019-09-15 NOTE — Telephone Encounter (Signed)
-----   Message from Carole Civil, MD sent at 09/15/2019  3:20 PM EST ----- NEEDS KNEE INJECTION   PLEASE MAKE HIM AN APPT

## 2019-09-15 NOTE — Telephone Encounter (Signed)
[name] [name] has a biceps tendon split tear with a small rotator cuff tear and a ganglion cyst from his arthritic AC joint  Recommend nonoperative treatment  Results given

## 2019-09-16 DIAGNOSIS — Z992 Dependence on renal dialysis: Secondary | ICD-10-CM | POA: Diagnosis not present

## 2019-09-16 DIAGNOSIS — D509 Iron deficiency anemia, unspecified: Secondary | ICD-10-CM | POA: Diagnosis not present

## 2019-09-16 DIAGNOSIS — D631 Anemia in chronic kidney disease: Secondary | ICD-10-CM | POA: Diagnosis not present

## 2019-09-16 DIAGNOSIS — N186 End stage renal disease: Secondary | ICD-10-CM | POA: Diagnosis not present

## 2019-09-17 ENCOUNTER — Other Ambulatory Visit: Payer: Self-pay

## 2019-09-17 ENCOUNTER — Ambulatory Visit (INDEPENDENT_AMBULATORY_CARE_PROVIDER_SITE_OTHER): Payer: Medicare Other | Admitting: Orthopedic Surgery

## 2019-09-17 DIAGNOSIS — M25562 Pain in left knee: Secondary | ICD-10-CM

## 2019-09-17 DIAGNOSIS — G8929 Other chronic pain: Secondary | ICD-10-CM

## 2019-09-17 DIAGNOSIS — D631 Anemia in chronic kidney disease: Secondary | ICD-10-CM | POA: Diagnosis not present

## 2019-09-17 DIAGNOSIS — N186 End stage renal disease: Secondary | ICD-10-CM | POA: Diagnosis not present

## 2019-09-17 DIAGNOSIS — D509 Iron deficiency anemia, unspecified: Secondary | ICD-10-CM | POA: Diagnosis not present

## 2019-09-17 DIAGNOSIS — Z992 Dependence on renal dialysis: Secondary | ICD-10-CM | POA: Diagnosis not present

## 2019-09-17 NOTE — Patient Instructions (Signed)

## 2019-09-17 NOTE — Progress Notes (Signed)
Chief complaint pain left knee  Patient request injection left knee  Knee is amenable to injection  Procedure note left knee injection   verbal consent was obtained to inject left knee joint  Timeout was completed to confirm the site of injection  The medications used were 40 mg of Depo-Medrol and 1% lidocaine 3 cc  Anesthesia was provided by ethyl chloride and the skin was prepped with alcohol.  After cleaning the skin with alcohol a 20-gauge needle was used to inject the left knee joint. There were no complications. A sterile bandage was applied.  Encounter Diagnosis  Name Primary?  . Chronic pain of left knee Yes

## 2019-09-18 DIAGNOSIS — Z992 Dependence on renal dialysis: Secondary | ICD-10-CM | POA: Diagnosis not present

## 2019-09-18 DIAGNOSIS — D509 Iron deficiency anemia, unspecified: Secondary | ICD-10-CM | POA: Diagnosis not present

## 2019-09-18 DIAGNOSIS — D631 Anemia in chronic kidney disease: Secondary | ICD-10-CM | POA: Diagnosis not present

## 2019-09-18 DIAGNOSIS — N186 End stage renal disease: Secondary | ICD-10-CM | POA: Diagnosis not present

## 2019-09-18 DIAGNOSIS — Z23 Encounter for immunization: Secondary | ICD-10-CM | POA: Diagnosis not present

## 2019-09-19 DIAGNOSIS — D631 Anemia in chronic kidney disease: Secondary | ICD-10-CM | POA: Diagnosis not present

## 2019-09-19 DIAGNOSIS — D509 Iron deficiency anemia, unspecified: Secondary | ICD-10-CM | POA: Diagnosis not present

## 2019-09-19 DIAGNOSIS — Z992 Dependence on renal dialysis: Secondary | ICD-10-CM | POA: Diagnosis not present

## 2019-09-19 DIAGNOSIS — N186 End stage renal disease: Secondary | ICD-10-CM | POA: Diagnosis not present

## 2019-09-20 DIAGNOSIS — D509 Iron deficiency anemia, unspecified: Secondary | ICD-10-CM | POA: Diagnosis not present

## 2019-09-20 DIAGNOSIS — D631 Anemia in chronic kidney disease: Secondary | ICD-10-CM | POA: Diagnosis not present

## 2019-09-20 DIAGNOSIS — N186 End stage renal disease: Secondary | ICD-10-CM | POA: Diagnosis not present

## 2019-09-20 DIAGNOSIS — Z992 Dependence on renal dialysis: Secondary | ICD-10-CM | POA: Diagnosis not present

## 2019-09-21 DIAGNOSIS — D509 Iron deficiency anemia, unspecified: Secondary | ICD-10-CM | POA: Diagnosis not present

## 2019-09-21 DIAGNOSIS — N186 End stage renal disease: Secondary | ICD-10-CM | POA: Diagnosis not present

## 2019-09-21 DIAGNOSIS — Z992 Dependence on renal dialysis: Secondary | ICD-10-CM | POA: Diagnosis not present

## 2019-09-21 DIAGNOSIS — D631 Anemia in chronic kidney disease: Secondary | ICD-10-CM | POA: Diagnosis not present

## 2019-09-22 ENCOUNTER — Other Ambulatory Visit: Payer: Medicare Other

## 2019-09-22 DIAGNOSIS — Z992 Dependence on renal dialysis: Secondary | ICD-10-CM | POA: Diagnosis not present

## 2019-09-22 DIAGNOSIS — N186 End stage renal disease: Secondary | ICD-10-CM | POA: Diagnosis not present

## 2019-09-22 DIAGNOSIS — D509 Iron deficiency anemia, unspecified: Secondary | ICD-10-CM | POA: Diagnosis not present

## 2019-09-22 DIAGNOSIS — D631 Anemia in chronic kidney disease: Secondary | ICD-10-CM | POA: Diagnosis not present

## 2019-09-23 DIAGNOSIS — D631 Anemia in chronic kidney disease: Secondary | ICD-10-CM | POA: Diagnosis not present

## 2019-09-23 DIAGNOSIS — N186 End stage renal disease: Secondary | ICD-10-CM | POA: Diagnosis not present

## 2019-09-23 DIAGNOSIS — D509 Iron deficiency anemia, unspecified: Secondary | ICD-10-CM | POA: Diagnosis not present

## 2019-09-23 DIAGNOSIS — Z992 Dependence on renal dialysis: Secondary | ICD-10-CM | POA: Diagnosis not present

## 2019-09-24 ENCOUNTER — Ambulatory Visit (HOSPITAL_COMMUNITY)
Admission: RE | Admit: 2019-09-24 | Discharge: 2019-09-24 | Disposition: A | Discharge status: Home / Self Care | Payer: Medicare Other | Source: Ambulatory Visit | Attending: Internal Medicine | Admitting: Internal Medicine

## 2019-09-24 ENCOUNTER — Other Ambulatory Visit: Payer: Self-pay | Admitting: "Endocrinology

## 2019-09-24 ENCOUNTER — Other Ambulatory Visit: Payer: Self-pay

## 2019-09-24 DIAGNOSIS — R935 Abnormal findings on diagnostic imaging of other abdominal regions, including retroperitoneum: Secondary | ICD-10-CM | POA: Insufficient documentation

## 2019-09-24 DIAGNOSIS — Z7682 Awaiting organ transplant status: Secondary | ICD-10-CM | POA: Diagnosis not present

## 2019-09-24 DIAGNOSIS — N281 Cyst of kidney, acquired: Secondary | ICD-10-CM | POA: Diagnosis not present

## 2019-09-24 DIAGNOSIS — D509 Iron deficiency anemia, unspecified: Secondary | ICD-10-CM | POA: Diagnosis not present

## 2019-09-24 DIAGNOSIS — Z992 Dependence on renal dialysis: Secondary | ICD-10-CM | POA: Diagnosis not present

## 2019-09-24 DIAGNOSIS — D631 Anemia in chronic kidney disease: Secondary | ICD-10-CM | POA: Diagnosis not present

## 2019-09-24 DIAGNOSIS — N186 End stage renal disease: Secondary | ICD-10-CM | POA: Diagnosis not present

## 2019-09-25 DIAGNOSIS — D631 Anemia in chronic kidney disease: Secondary | ICD-10-CM | POA: Diagnosis not present

## 2019-09-25 DIAGNOSIS — D509 Iron deficiency anemia, unspecified: Secondary | ICD-10-CM | POA: Diagnosis not present

## 2019-09-25 DIAGNOSIS — Z992 Dependence on renal dialysis: Secondary | ICD-10-CM | POA: Diagnosis not present

## 2019-09-25 DIAGNOSIS — N186 End stage renal disease: Secondary | ICD-10-CM | POA: Diagnosis not present

## 2019-09-26 DIAGNOSIS — D631 Anemia in chronic kidney disease: Secondary | ICD-10-CM | POA: Diagnosis not present

## 2019-09-26 DIAGNOSIS — D509 Iron deficiency anemia, unspecified: Secondary | ICD-10-CM | POA: Diagnosis not present

## 2019-09-26 DIAGNOSIS — N186 End stage renal disease: Secondary | ICD-10-CM | POA: Diagnosis not present

## 2019-09-26 DIAGNOSIS — Z992 Dependence on renal dialysis: Secondary | ICD-10-CM | POA: Diagnosis not present

## 2019-09-27 DIAGNOSIS — D631 Anemia in chronic kidney disease: Secondary | ICD-10-CM | POA: Diagnosis not present

## 2019-09-27 DIAGNOSIS — D509 Iron deficiency anemia, unspecified: Secondary | ICD-10-CM | POA: Diagnosis not present

## 2019-09-27 DIAGNOSIS — N186 End stage renal disease: Secondary | ICD-10-CM | POA: Diagnosis not present

## 2019-09-27 DIAGNOSIS — Z992 Dependence on renal dialysis: Secondary | ICD-10-CM | POA: Diagnosis not present

## 2019-09-28 DIAGNOSIS — Z992 Dependence on renal dialysis: Secondary | ICD-10-CM | POA: Diagnosis not present

## 2019-09-28 DIAGNOSIS — D631 Anemia in chronic kidney disease: Secondary | ICD-10-CM | POA: Diagnosis not present

## 2019-09-28 DIAGNOSIS — N186 End stage renal disease: Secondary | ICD-10-CM | POA: Diagnosis not present

## 2019-09-28 DIAGNOSIS — D509 Iron deficiency anemia, unspecified: Secondary | ICD-10-CM | POA: Diagnosis not present

## 2019-09-29 DIAGNOSIS — N186 End stage renal disease: Secondary | ICD-10-CM | POA: Diagnosis not present

## 2019-09-29 DIAGNOSIS — D631 Anemia in chronic kidney disease: Secondary | ICD-10-CM | POA: Diagnosis not present

## 2019-09-29 DIAGNOSIS — D509 Iron deficiency anemia, unspecified: Secondary | ICD-10-CM | POA: Diagnosis not present

## 2019-09-29 DIAGNOSIS — Z992 Dependence on renal dialysis: Secondary | ICD-10-CM | POA: Diagnosis not present

## 2019-09-30 DIAGNOSIS — D509 Iron deficiency anemia, unspecified: Secondary | ICD-10-CM | POA: Diagnosis not present

## 2019-09-30 DIAGNOSIS — D631 Anemia in chronic kidney disease: Secondary | ICD-10-CM | POA: Diagnosis not present

## 2019-09-30 DIAGNOSIS — E1165 Type 2 diabetes mellitus with hyperglycemia: Secondary | ICD-10-CM | POA: Diagnosis not present

## 2019-09-30 DIAGNOSIS — I10 Essential (primary) hypertension: Secondary | ICD-10-CM | POA: Diagnosis not present

## 2019-09-30 DIAGNOSIS — Z992 Dependence on renal dialysis: Secondary | ICD-10-CM | POA: Diagnosis not present

## 2019-09-30 DIAGNOSIS — N186 End stage renal disease: Secondary | ICD-10-CM | POA: Diagnosis not present

## 2019-10-01 DIAGNOSIS — D509 Iron deficiency anemia, unspecified: Secondary | ICD-10-CM | POA: Diagnosis not present

## 2019-10-01 DIAGNOSIS — D631 Anemia in chronic kidney disease: Secondary | ICD-10-CM | POA: Diagnosis not present

## 2019-10-01 DIAGNOSIS — Z992 Dependence on renal dialysis: Secondary | ICD-10-CM | POA: Diagnosis not present

## 2019-10-01 DIAGNOSIS — N186 End stage renal disease: Secondary | ICD-10-CM | POA: Diagnosis not present

## 2019-10-02 ENCOUNTER — Ambulatory Visit (INDEPENDENT_AMBULATORY_CARE_PROVIDER_SITE_OTHER): Payer: Medicare Other | Admitting: Emergency Medicine

## 2019-10-02 ENCOUNTER — Other Ambulatory Visit: Payer: Self-pay

## 2019-10-02 ENCOUNTER — Encounter: Payer: Self-pay | Admitting: Emergency Medicine

## 2019-10-02 VITALS — BP 94/64 | HR 72 | Temp 97.8°F | Ht 64.0 in | Wt 241.8 lb

## 2019-10-02 DIAGNOSIS — J441 Chronic obstructive pulmonary disease with (acute) exacerbation: Secondary | ICD-10-CM | POA: Diagnosis not present

## 2019-10-02 DIAGNOSIS — Z9989 Dependence on other enabling machines and devices: Secondary | ICD-10-CM

## 2019-10-02 DIAGNOSIS — G4733 Obstructive sleep apnea (adult) (pediatric): Secondary | ICD-10-CM

## 2019-10-02 DIAGNOSIS — D509 Iron deficiency anemia, unspecified: Secondary | ICD-10-CM | POA: Diagnosis not present

## 2019-10-02 DIAGNOSIS — J4540 Moderate persistent asthma, uncomplicated: Secondary | ICD-10-CM

## 2019-10-02 DIAGNOSIS — D631 Anemia in chronic kidney disease: Secondary | ICD-10-CM | POA: Diagnosis not present

## 2019-10-02 DIAGNOSIS — J45909 Unspecified asthma, uncomplicated: Secondary | ICD-10-CM | POA: Insufficient documentation

## 2019-10-02 DIAGNOSIS — Z992 Dependence on renal dialysis: Secondary | ICD-10-CM | POA: Diagnosis not present

## 2019-10-02 DIAGNOSIS — N186 End stage renal disease: Secondary | ICD-10-CM | POA: Diagnosis not present

## 2019-10-02 MED ORDER — STIOLTO RESPIMAT 2.5-2.5 MCG/ACT IN AERS: 2.0000 | INHALATION_SPRAY | Freq: Every day | RESPIRATORY_TRACT | 0 refills | Status: DC
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
## 2019-10-02 NOTE — Assessment & Plan Note (Signed)
Poor compliance.  He uses nasal pillows but infrequently.  Does not describe any significant leak or difficulty tolerating set pressure, but he has problems keeping the device on his face due to irritation.  He is willing to try to restart when is going to work on this before our next visit.  Work hard on trying to reintroduce CPAP each evening while sleeping.  You may want to try using for a few hours each night and then slowly increase the duration.

## 2019-10-02 NOTE — Patient Instructions (Signed)
We will temporarily stop Symbicort. Please start Stiolto 2 puffs once daily.  If you benefit from this medication then we will continue it going forward. Keep your albuterol available to use either 2 puffs or 1 nebulizer treatment up to every 4 hours if you need it for shortness of breath, chest tightness, wheezing. We will perform pulmonary function testing at your next office visit Work hard on trying to reintroduce CPAP each evening while sleeping.  You may want to try using for a few hours each night and then slowly increase the duration. Follow with Dr Lamonte Sakai in 1 month or next available with full PFT on the same day.
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
## 2019-10-03 DIAGNOSIS — D631 Anemia in chronic kidney disease: Secondary | ICD-10-CM | POA: Diagnosis not present

## 2019-10-03 DIAGNOSIS — D509 Iron deficiency anemia, unspecified: Secondary | ICD-10-CM | POA: Diagnosis not present

## 2019-10-03 DIAGNOSIS — Z992 Dependence on renal dialysis: Secondary | ICD-10-CM | POA: Diagnosis not present

## 2019-10-03 DIAGNOSIS — N186 End stage renal disease: Secondary | ICD-10-CM | POA: Diagnosis not present

## 2019-10-04 DIAGNOSIS — Z992 Dependence on renal dialysis: Secondary | ICD-10-CM | POA: Diagnosis not present

## 2019-10-04 DIAGNOSIS — D509 Iron deficiency anemia, unspecified: Secondary | ICD-10-CM | POA: Diagnosis not present

## 2019-10-04 DIAGNOSIS — N186 End stage renal disease: Secondary | ICD-10-CM | POA: Diagnosis not present

## 2019-10-04 DIAGNOSIS — D631 Anemia in chronic kidney disease: Secondary | ICD-10-CM | POA: Diagnosis not present

## 2019-10-05 DIAGNOSIS — N186 End stage renal disease: Secondary | ICD-10-CM | POA: Diagnosis not present

## 2019-10-05 DIAGNOSIS — D631 Anemia in chronic kidney disease: Secondary | ICD-10-CM | POA: Diagnosis not present

## 2019-10-05 DIAGNOSIS — D509 Iron deficiency anemia, unspecified: Secondary | ICD-10-CM | POA: Diagnosis not present

## 2019-10-05 DIAGNOSIS — Z992 Dependence on renal dialysis: Secondary | ICD-10-CM | POA: Diagnosis not present

## 2019-10-06 DIAGNOSIS — N186 End stage renal disease: Secondary | ICD-10-CM | POA: Diagnosis not present

## 2019-10-06 DIAGNOSIS — Z992 Dependence on renal dialysis: Secondary | ICD-10-CM | POA: Diagnosis not present

## 2019-10-06 DIAGNOSIS — D631 Anemia in chronic kidney disease: Secondary | ICD-10-CM | POA: Diagnosis not present

## 2019-10-06 DIAGNOSIS — D509 Iron deficiency anemia, unspecified: Secondary | ICD-10-CM | POA: Diagnosis not present

## 2019-10-07 DIAGNOSIS — D631 Anemia in chronic kidney disease: Secondary | ICD-10-CM | POA: Diagnosis not present

## 2019-10-07 DIAGNOSIS — N186 End stage renal disease: Secondary | ICD-10-CM | POA: Diagnosis not present

## 2019-10-07 DIAGNOSIS — Z992 Dependence on renal dialysis: Secondary | ICD-10-CM | POA: Diagnosis not present

## 2019-10-07 DIAGNOSIS — D509 Iron deficiency anemia, unspecified: Secondary | ICD-10-CM | POA: Diagnosis not present

## 2019-10-08 DIAGNOSIS — N186 End stage renal disease: Secondary | ICD-10-CM | POA: Diagnosis not present

## 2019-10-08 DIAGNOSIS — D509 Iron deficiency anemia, unspecified: Secondary | ICD-10-CM | POA: Diagnosis not present

## 2019-10-08 DIAGNOSIS — Z992 Dependence on renal dialysis: Secondary | ICD-10-CM | POA: Diagnosis not present

## 2019-10-08 DIAGNOSIS — D631 Anemia in chronic kidney disease: Secondary | ICD-10-CM | POA: Diagnosis not present

## 2019-10-09 DIAGNOSIS — Z992 Dependence on renal dialysis: Secondary | ICD-10-CM | POA: Diagnosis not present

## 2019-10-09 DIAGNOSIS — D631 Anemia in chronic kidney disease: Secondary | ICD-10-CM | POA: Diagnosis not present

## 2019-10-09 DIAGNOSIS — D509 Iron deficiency anemia, unspecified: Secondary | ICD-10-CM | POA: Diagnosis not present

## 2019-10-09 DIAGNOSIS — N186 End stage renal disease: Secondary | ICD-10-CM | POA: Diagnosis not present

## 2019-10-10 DIAGNOSIS — Z992 Dependence on renal dialysis: Secondary | ICD-10-CM | POA: Diagnosis not present

## 2019-10-10 DIAGNOSIS — N186 End stage renal disease: Secondary | ICD-10-CM | POA: Diagnosis not present

## 2019-10-10 DIAGNOSIS — D631 Anemia in chronic kidney disease: Secondary | ICD-10-CM | POA: Diagnosis not present

## 2019-10-10 DIAGNOSIS — D509 Iron deficiency anemia, unspecified: Secondary | ICD-10-CM | POA: Diagnosis not present

## 2019-10-11 DIAGNOSIS — N186 End stage renal disease: Secondary | ICD-10-CM | POA: Diagnosis not present

## 2019-10-11 DIAGNOSIS — D509 Iron deficiency anemia, unspecified: Secondary | ICD-10-CM | POA: Diagnosis not present

## 2019-10-11 DIAGNOSIS — Z992 Dependence on renal dialysis: Secondary | ICD-10-CM | POA: Diagnosis not present

## 2019-10-11 DIAGNOSIS — D631 Anemia in chronic kidney disease: Secondary | ICD-10-CM | POA: Diagnosis not present

## 2019-10-12 DIAGNOSIS — D509 Iron deficiency anemia, unspecified: Secondary | ICD-10-CM | POA: Diagnosis not present

## 2019-10-12 DIAGNOSIS — N186 End stage renal disease: Secondary | ICD-10-CM | POA: Diagnosis not present

## 2019-10-12 DIAGNOSIS — Z992 Dependence on renal dialysis: Secondary | ICD-10-CM | POA: Diagnosis not present

## 2019-10-12 DIAGNOSIS — D631 Anemia in chronic kidney disease: Secondary | ICD-10-CM | POA: Diagnosis not present

## 2019-10-13 DIAGNOSIS — D631 Anemia in chronic kidney disease: Secondary | ICD-10-CM | POA: Diagnosis not present

## 2019-10-13 DIAGNOSIS — N186 End stage renal disease: Secondary | ICD-10-CM | POA: Diagnosis not present

## 2019-10-13 DIAGNOSIS — D509 Iron deficiency anemia, unspecified: Secondary | ICD-10-CM | POA: Diagnosis not present

## 2019-10-13 DIAGNOSIS — Z992 Dependence on renal dialysis: Secondary | ICD-10-CM | POA: Diagnosis not present

## 2019-10-14 ENCOUNTER — Other Ambulatory Visit: Payer: Self-pay

## 2019-10-14 ENCOUNTER — Encounter: Payer: Self-pay | Admitting: "Endocrinology

## 2019-10-14 ENCOUNTER — Ambulatory Visit (INDEPENDENT_AMBULATORY_CARE_PROVIDER_SITE_OTHER): Payer: Medicare Other | Admitting: "Endocrinology

## 2019-10-14 VITALS — BP 100/62 | HR 71 | Ht 64.0 in | Wt 240.0 lb

## 2019-10-14 DIAGNOSIS — E782 Mixed hyperlipidemia: Secondary | ICD-10-CM

## 2019-10-14 DIAGNOSIS — N186 End stage renal disease: Secondary | ICD-10-CM | POA: Diagnosis not present

## 2019-10-14 DIAGNOSIS — I10 Essential (primary) hypertension: Secondary | ICD-10-CM | POA: Diagnosis not present

## 2019-10-14 DIAGNOSIS — D509 Iron deficiency anemia, unspecified: Secondary | ICD-10-CM | POA: Diagnosis not present

## 2019-10-14 DIAGNOSIS — N185 Chronic kidney disease, stage 5: Secondary | ICD-10-CM

## 2019-10-14 DIAGNOSIS — Z992 Dependence on renal dialysis: Secondary | ICD-10-CM | POA: Diagnosis not present

## 2019-10-14 DIAGNOSIS — E1122 Type 2 diabetes mellitus with diabetic chronic kidney disease: Secondary | ICD-10-CM | POA: Diagnosis not present

## 2019-10-14 DIAGNOSIS — D631 Anemia in chronic kidney disease: Secondary | ICD-10-CM | POA: Diagnosis not present

## 2019-10-14 LAB — POCT GLYCOSYLATED HEMOGLOBIN (HGB A1C): Hemoglobin A1C: 6.4 % — AB (ref 4.0–5.6)

## 2019-10-14 NOTE — Progress Notes (Signed)
10/14/2019   Endocrinology follow-up note   Subjective:    Patient ID: [name] [name], male    DOB: 21-Feb-1951, PCP [name], MD   Past Medical History:  Diagnosis Date  . Anemia   . Arthritis   . Asthma   . Bell palsy   . CAD (coronary artery disease)    a. 2014 MV: abnl w/ infap ischemia; b. 03/2013 Cath: aneurysmal bleb in the LAD w/ otw nonobs dzs-->Med Rx.  . Chronic back pain   . Chronic knee pain    a. 09/2015 s/p R TKA.  Marland Kitchen Chronic pain   . Chronic shoulder pain   . Chronic sinusitis   . CKD (chronic kidney disease), stage IV ([place])    stage 4 per office visit note of Dr Lowanda Foster on 05/2015   . COPD (chronic obstructive pulmonary disease) ([place])   . Diabetes mellitus without complication ([place])    type II   . Essential hypertension   . GERD (gastroesophageal reflux disease)   . Gout   . Gout   . Hepatomegaly    noted on noncontrast CT 2015  . History of hiatal hernia   . Hyperlipidemia   . Lateral meniscus tear   . Obesity    [place]  . Obstructive sleep apnea    does not use cpap   . On home oxygen therapy    uses 2l when is going somewhere per patient   . PUD (peptic ulcer disease)    remote, reports f/u EGD about 8 years ago unremarkable   . Reactive airway disease    related to exposure to chemical during 9/11  . Renal insufficiency   . Sinusitis   . Vitamin D deficiency    Past Surgical History:  Procedure Laterality Date  . ASAD LT SHOULDER  12/2008   left shoulder  . AV FISTULA PLACEMENT Left 08/09/2016   Procedure: BRACHIOCEPHALIC ARTERIOVENOUS (AV) FISTULA CREATION LEFT ARM;  Surgeon: [name], MD;  Location: [place];  Service: Vascular;  Laterality: Left;  . CAPD INSERTION N/A 10/07/2018   Procedure: LAPAROSCOPIC PERITONEAL CATHETER PLACEMENT;  Surgeon: Kieth Brightly Arta Bruce, MD;  Location: [facility];  Service: General;  Laterality: N/A;  . CATARACT EXTRACTION W/PHACO Left 03/28/2016   Procedure: CATARACT EXTRACTION PHACO  AND INTRAOCULAR LENS PLACEMENT LEFT EYE;  Surgeon: [name], MD;  Location: [facility];  Service: Ophthalmology;  Laterality: Left;  CDE: 4.77  . CATARACT EXTRACTION W/PHACO Right 04/11/2016   Procedure: CATARACT EXTRACTION PHACO AND INTRAOCULAR LENS PLACEMENT RIGHT EYE; CDE:  4.74;  Surgeon: [name], MD;  Location: [facility];  Service: Ophthalmology;  Laterality: Right;  . COLONOSCOPY  10/2008   Fields: Rectal polyp obliterated, not retrieved, hemorrhoids, single ascending colon diverticulum near the CV. Next colonoscopy April 2020  . COLONOSCOPY N/A 12/25/2014   SLF: 1. Colorectal polyps (2) removed 2. Small internal hemorrhoids 3. the left colon is severely redundant  . DOPPLER ECHOCARDIOGRAPHY    . ESOPHAGOGASTRODUODENOSCOPY N/A 12/25/2014   SLF: 1. Anemia most likely due to CRI, gastritis, gastric polyps 2. Moderate non-erosive gastriits and mild duodenitis.  3.TWo large gstric polyps removed.   Marland Kitchen EYE SURGERY  12/22/2010   tear duct probing-[place]  . FOREIGN BODY REMOVAL  03/29/2011   Procedure: REMOVAL FOREIGN BODY EXTREMITY;  Surgeon: Arther Abbott, MD;  Location: [facility];  Service: Orthopedics;  Laterality: Right;  Removal Foreign Body Right Thumb  .  IR FLUORO GUIDE CV LINE RIGHT  08/06/2018  . IR US GUIDE VASC ACCESS RIGHT  08/06/2018  . KNEE ARTHROSCOPY  10/2007   left  . KNEE ARTHROSCOPY WITH LATERAL MENISECTOMY Right 10/14/2015   Procedure: LEFT KNEE ARTHROSCOPY WITH PARTIAL LATERAL MENISECTOMY;  Surgeon: Carole Civil, MD;  Location: [facility];  Service: Orthopedics;  Laterality: Right;  . LEFT HEART CATHETERIZATION WITH CORONARY ANGIOGRAM N/A 03/28/2013   Procedure: LEFT HEART CATHETERIZATION WITH CORONARY ANGIOGRAM;  Surgeon: Leonie Man, MD;  Location: [facility];  Service: Cardiovascular;  Laterality: N/A;  . NM MYOVIEW LTD    . PENILE PROSTHESIS IMPLANT N/A 08/16/2015   Procedure: PENILE PROTHESIS INFLATABLE, three piece, Excisional biopsy of Penile ulcer, Penile molding;   Surgeon: Carolan Clines, MD;  Location: [facility];  Service: Urology;  Laterality: N/A;  . PENILE PROSTHESIS IMPLANT N/A 12/24/2017   Procedure: REMOVAL AND  REPLACEMENT  COLOPLAST PENILE PROSTHESIS;  Surgeon: Lucas Mallow, MD;  Location: [facility];  Service: Urology;  Laterality: N/A;  . QUADRICEPS TENDON REPAIR  07/21/2011   Procedure: REPAIR QUADRICEP TENDON;  Surgeon: Arther Abbott, MD;  Location: [facility];  Service: Orthopedics;  Laterality: Right;  . TOENAIL EXCISION     removed [id number]  . UMBILICAL HERNIA REPAIR  2007   [place]   Social History   Socioeconomic History  . Marital status: Married    Spouse name: Not on file  . Number of children: 2  . Years of education: 12th grade  . Highest education level: Not on file  Occupational History  . Occupation: disabled  . Occupation:      Employer: UNEMPLOYED  Tobacco Use  . Smoking status: Former Smoker    Packs/day: 1.00    Years: 25.00    Pack years: 25.00    Types: Cigarettes    Quit date: 03/27/2010    Years since quitting: 9.5  . Smokeless tobacco: Never Used  . Tobacco comment: Quit x 7 years  Substance and Sexual Activity  . Alcohol use: Yes    Alcohol/week: 0.0 standard drinks    Comment: occasionally  . Drug use: Not Currently    Types: Marijuana    Comment: cocaine- last time used- 11/24/2017 , marijuana-   . Sexual activity: Not on file  Other Topics Concern  . Not on file  Social History Narrative   He quit smoking in 2010. 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 He developed pulmonary problems, became disabled because of lower airway disease in 2009.       WATCHES BASKETBALL. HIS TEAM IS [employer].   Social Determinants of Health   Financial Resource Strain:   . Difficulty of Paying Living Expenses:   Food Insecurity:   . Worried About Charity fundraiser in the Last Year:   . Arboriculturist in the Last Year:   Transportation Needs:   . Lexicographer (Medical):   Marland Kitchen Lack of Transportation (Non-Medical):   Physical Activity:   . Days of Exercise per Week:   . Minutes of Exercise per Session:   Stress:   . Feeling of Stress :   Social Connections:   . Frequency of Communication with Friends and Family:   . Frequency of Social Gatherings with Friends and Family:   . Attends Religious Services:   . Active Member of Clubs or Organizations:   . Attends Archivist Meetings:   .  Marital Status:    Outpatient Encounter Medications as of 10/14/2019  Medication Sig  . allopurinol (ZYLOPRIM) 100 MG tablet Take 200 mg by mouth daily.   Marland Kitchen amLODipine (NORVASC) 10 MG tablet Take 10 mg by mouth daily.  Marland Kitchen aspirin EC 81 MG tablet Take 1 tablet (81 mg total) by mouth daily.  . calcitRIOL (ROCALTROL) 0.25 MCG capsule Take 0.25 mcg by mouth daily.  . cetirizine (ZYRTEC) 10 MG tablet Take 10 mg by mouth at bedtime as needed for allergies.   . DROPLET PEN NEEDLES 31G X 5 MM MISC INJECT SUBCUTANEOUSLY TWICE DAILY AS DIRECTED  . fluticasone (FLONASE) 50 MCG/ACT nasal spray Place 2 sprays into both nostrils daily.  . folic acid (FOLVITE) 1 MG tablet Take 1 tablet by mouth daily.  . hydrALAZINE (APRESOLINE) 50 MG tablet Take 1 tablet (50 mg total) by mouth 3 (three) times daily.  . insulin glargine (LANTUS SOLOSTAR) 100 UNIT/ML Solostar Pen Inject 20 Units into the skin at bedtime. INJECT  20 UNITS SUBCUTANEOUSLY AT BEDTIME  . mometasone (NASONEX) 50 MCG/ACT nasal spray Place 2 sprays into the nose 2 (two) times daily as needed (allergies).   . pantoprazole (PROTONIX) 40 MG tablet TAKE 1 TABLET TWICE DAILY 30 MINUTES PRIOR TO MEALS  . simvastatin (ZOCOR) 20 MG tablet Take 20 mg by mouth every morning.   . Tiotropium Bromide-Olodaterol (STIOLTO RESPIMAT) 2.5-2.5 MCG/ACT AERS Inhale 2 puffs into the lungs daily.  . TRADJENTA 5 MG TABS tablet TAKE 1 TABLET EVERY DAY  . [DISCONTINUED] budesonide-formoterol (SYMBICORT) 160-4.5 MCG/ACT  inhaler Inhale 2 puffs into the lungs 2 (two) times daily as needed (Shortness of Breath).   . [DISCONTINUED] clindamycin (CLEOCIN) 150 MG capsule Take 3 capsules (450 mg total) by mouth 3 (three) times daily. X 7 days  . [DISCONTINUED] HYDROcodone-acetaminophen (NORCO/VICODIN) 5-325 MG tablet Take 1 tablet by mouth every 6 (six) hours as needed for severe pain.   No facility-administered encounter medications on file as of 10/14/2019.   ALLERGIES: Allergies  Allergen Reactions  . Tramadol Hcl   . Opana [Oxymorphone Hcl] Itching  . Oxymorphone Itching  . Tramadol Itching   VACCINATION STATUS: Immunization History  Administered Date(s) Administered  . Influenza, High Dose Seasonal PF 04/17/2019  . Td 03/27/2011    Diabetes He presents for his follow-up diabetic visit. He has type 2 diabetes mellitus. Onset time: He was diagnosed approximately 20 years ago. His disease course has been stable. There are no hypoglycemic associated symptoms. Pertinent negatives for hypoglycemia include no confusion, headaches, pallor or seizures. Pertinent negatives for diabetes include no chest pain, no fatigue, no polydipsia, no polyphagia, no polyuria and no weakness. There are no hypoglycemic complications. Symptoms are stable. Diabetic complications include nephropathy. (Patient with end-stage renal disease now on hemodialysis.) Risk factors for coronary artery disease include dyslipidemia, diabetes mellitus, hypertension, obesity, sedentary lifestyle, tobacco exposure and male sex. Current diabetic treatment includes insulin injections and oral agent (monotherapy). He is compliant with treatment most of the time. His weight is stable. He is following a generally unhealthy diet. He has had a previous visit with a dietitian. He rarely participates in exercise. His home blood glucose trend is decreasing steadily. His breakfast blood glucose range is generally 130-140 mg/dl. His bedtime blood glucose range is  generally 130-140 mg/dl. His overall blood glucose range is 130-140 mg/dl. Eye exam is current.  Hypertension This is a chronic problem. The current episode started more than 1 year ago. The problem is controlled. Pertinent  negatives include no chest pain, headaches, neck pain, palpitations or shortness of breath. Risk factors for coronary artery disease include diabetes mellitus, dyslipidemia, obesity, smoking/tobacco exposure, sedentary lifestyle and male gender. Past treatments include angiotensin blockers. Hypertensive end-organ damage includes kidney disease.  Hyperlipidemia This is a chronic problem. The current episode started more than 1 year ago. Exacerbating diseases include diabetes and obesity. Pertinent negatives include no chest pain, myalgias or shortness of breath. Current antihyperlipidemic treatment includes statins. Risk factors for coronary artery disease include diabetes mellitus, dyslipidemia, hypertension, male sex and a sedentary lifestyle.    Review of systems  Constitutional: + Minimally fluctuating body weight,  current  Body mass index is 41.2 kg/m. , no fatigue, no subjective hyperthermia, no subjective hypothermia Eyes: no blurry vision, no xerophthalmia ENT: no sore throat, no nodules palpated in throat, no dysphagia/odynophagia, no hoarseness Cardiovascular: no Chest Pain, no Shortness of Breath, no palpitations, no leg swelling Respiratory: no cough, no shortness of breath Gastrointestinal: no Nausea/Vomiting/Diarhhea Musculoskeletal: no muscle/joint aches Skin: no rashes, no hyperemia Neurological: no tremors, no numbness, no tingling, no dizziness Psychiatric: no depression, no anxiety   Objective:    BP 100/62   Pulse 71   Ht 5\' 4"  (1.626 m)   Wt 240 lb (108.9 kg)   BMI 41.20 kg/m   Wt Readings from Last 3 Encounters:  10/14/19 240 lb (108.9 kg)  10/02/19 241 lb 12.8 oz (109.7 kg)  08/25/19 241 lb (109.3 kg)     Physical Exam-  Limited  Constitutional:  Body mass index is 41.2 kg/m. , not in acute distress, normal state of mind Eyes:  EOMI, no exophthalmos Neck: Supple Thyroid: No gross goiter Respiratory: Adequate breathing efforts Musculoskeletal: no gross deformities, strength intact in all four extremities, no gross restriction of joint movements Skin:  no rashes, no hyperemia Neurological: no tremor with outstretched hands,    Results for orders placed or performed in visit on 10/14/19  HgB A1c  Result Value Ref Range   Hemoglobin A1C 6.4 (A) 4.0 - 5.6 %   HbA1c POC (<> result, manual entry)     HbA1c, POC (prediabetic range)     HbA1c, POC (controlled diabetic range)     Diabetic Labs (most recent): Lab Results  Component Value Date   HGBA1C 6.4 (A) 10/14/2019   HGBA1C 6.6 (H) 04/09/2019   HGBA1C 6.9 (H) 12/05/2018   Lipid Panel     Component Value Date/Time   CHOL 105 09/06/2018 1018   TRIG 69 09/06/2018 1018   HDL 38 (L) 09/06/2018 1018   CHOLHDL 2.8 09/06/2018 1018   VLDL 38 (H) 06/16/2016 0956   LDLCALC 52 09/06/2018 1018     Assessment & Plan:   1. Type 2 diabetes mellitus with stage 4 chronic kidney disease, with long-term current use of insulin (HCC)  -His  Diabetes is complicated by end-stage renal disease recently initiated on hemodialysis,  and patient remains at a high risk for more acute and chronic complications of diabetes which include CAD, CVA, CKD, retinopathy, and neuropathy. These are all discussed in detail with the patient.  -His presents with controlled fasting glycemic profile near target .  His previsit labs show A1c of 6.4%.    Recent labs reviewed. - I have re-counseled the patient on diet management and weight loss  by adopting a carbohydrate restricted / protein rich  Diet.   - he  admits there is a room for improvement in his diet and drink choices. -  Suggestion is made for him to avoid simple carbohydrates  from his diet including Cakes, Sweet  Desserts / Pastries, Ice Cream, Soda (diet and regular), Sweet Tea, Candies, Chips, Cookies, Sweet Pastries,  Store Bought Juices, Alcohol in Excess of  1-2 drinks a day, Artificial Sweeteners, Coffee Creamer, and "Sugar-free" Products. This will help patient to have stable blood glucose profile and potentially avoid unintended weight gain.    - Patient is advised to stick to a routine mealtimes to eat 3 meals  a day and avoid unnecessary snacks ( to snack only to correct hypoglycemia). I have also advised him to cut back on alcohol consumption.   - I have approached patient with the following individualized plan to manage diabetes and patient agrees.   -He is advised to stay away from alcohol and smoking.  -He is advised to continue  Lantus 20 units nightly,    associated with monitoring of blood glucose at least 2 times daily-daily before breakfast and at bedtime.   -   He will continue Tradjenta 5 mg p.o. daily at breakfast.  He is following with Jearld Fenton, CDE for DM education.   -He will continue to follow-up with nephrology  given stage 4-5 CKD, he has a dialysis fistula in place.    -he is not a candidate for metformin and SGLT2 inhibitors due to CKD.  - Patient specific target  for A1c; LDL, HDL, Triglycerides, were discussed in detail.  2) BP/HTN: his BP is controlled to target.     His blood pressure is controlled to target.   He is currently on amlodipine and hydralazine for blood pressure control.    3) Lipids/HPL: His recent lipid panel showed controlled LDL at 54.  He is advised to continue Simvastatin 20 mg p.o. nightly.   4)  Weight/Diet: His BMI is 41- clearly complicating his DM care. He is a candidate for modest weight loss.   CDE consult in progress, exercise, and carbohydrates information provided.   5) Chronic Care/Health Maintenance:  -Patient is on ACEI/ARB and Statin medications and encouraged to continue to follow up with Ophthalmology, nephrology (he is  status post fistula placement in prep for hemodialysis) Podiatrist at least yearly or according to recommendations, and advised to quit smoking. I have recommended yearly flu vaccine and pneumonia vaccination at least every 5 years; moderate intensity exercise for up to 150 minutes weekly; and  sleep for at least 7 hours a day. - I advised patient to maintain close follow up with his PCP for primary care needs.      - Time spent on this patient care encounter:  35 min, of which > 50% was spent in  counseling and the rest reviewing his blood glucose logs , discussing his hypoglycemia and hyperglycemia episodes, reviewing his current and  previous labs / studies  ( including abstraction from other facilities) and medications  doses and developing a  long term treatment plan and documenting his care.   Please refer to Patient Instructions for Blood Glucose Monitoring and Insulin/Medications Dosing Guide"  in media tab for additional information. Please  also refer to " Patient Self Inventory" in the Media  tab for reviewed elements of pertinent patient history.  [name] [name] participated in the discussions, expressed understanding, and voiced agreement with the above plans.  All questions were answered to his satisfaction. he is encouraged to contact clinic should he have any questions or concerns prior to his return visit.  Follow up plan: -Return  in about 4 months (around 02/13/2020) for Bring Meter and Logs- A1c in Office, Follow up with Pre-visit Labs.  Glade Lloyd, MD Phone: [phone]  Fax: [phone]   This note was partially dictated with voice recognition software. Similar sounding words can be transcribed inadequately or may not  be corrected upon review.  10/14/2019, 9:27 PM

## 2019-10-14 NOTE — Patient Instructions (Signed)

## 2019-10-15 DIAGNOSIS — N186 End stage renal disease: Secondary | ICD-10-CM | POA: Diagnosis not present

## 2019-10-15 DIAGNOSIS — Z992 Dependence on renal dialysis: Secondary | ICD-10-CM | POA: Diagnosis not present

## 2019-10-15 DIAGNOSIS — D509 Iron deficiency anemia, unspecified: Secondary | ICD-10-CM | POA: Diagnosis not present

## 2019-10-15 DIAGNOSIS — D631 Anemia in chronic kidney disease: Secondary | ICD-10-CM | POA: Diagnosis not present

## 2019-10-16 DIAGNOSIS — D509 Iron deficiency anemia, unspecified: Secondary | ICD-10-CM | POA: Diagnosis not present

## 2019-10-16 DIAGNOSIS — Z992 Dependence on renal dialysis: Secondary | ICD-10-CM | POA: Diagnosis not present

## 2019-10-16 DIAGNOSIS — N186 End stage renal disease: Secondary | ICD-10-CM | POA: Diagnosis not present

## 2019-10-16 DIAGNOSIS — T85898A Other specified complication of other internal prosthetic devices, implants and grafts, initial encounter: Secondary | ICD-10-CM | POA: Diagnosis not present

## 2019-10-16 DIAGNOSIS — D631 Anemia in chronic kidney disease: Secondary | ICD-10-CM | POA: Diagnosis not present

## 2019-10-16 DIAGNOSIS — Z23 Encounter for immunization: Secondary | ICD-10-CM | POA: Diagnosis not present

## 2019-10-16 DIAGNOSIS — N2581 Secondary hyperparathyroidism of renal origin: Secondary | ICD-10-CM | POA: Diagnosis not present

## 2019-10-17 DIAGNOSIS — D509 Iron deficiency anemia, unspecified: Secondary | ICD-10-CM | POA: Diagnosis not present

## 2019-10-17 DIAGNOSIS — D631 Anemia in chronic kidney disease: Secondary | ICD-10-CM | POA: Diagnosis not present

## 2019-10-17 DIAGNOSIS — Z992 Dependence on renal dialysis: Secondary | ICD-10-CM | POA: Diagnosis not present

## 2019-10-17 DIAGNOSIS — N186 End stage renal disease: Secondary | ICD-10-CM | POA: Diagnosis not present

## 2019-10-17 DIAGNOSIS — T85898A Other specified complication of other internal prosthetic devices, implants and grafts, initial encounter: Secondary | ICD-10-CM | POA: Diagnosis not present

## 2019-10-17 DIAGNOSIS — N2581 Secondary hyperparathyroidism of renal origin: Secondary | ICD-10-CM | POA: Diagnosis not present

## 2019-10-18 DIAGNOSIS — N186 End stage renal disease: Secondary | ICD-10-CM | POA: Diagnosis not present

## 2019-10-18 DIAGNOSIS — D631 Anemia in chronic kidney disease: Secondary | ICD-10-CM | POA: Diagnosis not present

## 2019-10-18 DIAGNOSIS — N2581 Secondary hyperparathyroidism of renal origin: Secondary | ICD-10-CM | POA: Diagnosis not present

## 2019-10-18 DIAGNOSIS — Z992 Dependence on renal dialysis: Secondary | ICD-10-CM | POA: Diagnosis not present

## 2019-10-18 DIAGNOSIS — D509 Iron deficiency anemia, unspecified: Secondary | ICD-10-CM | POA: Diagnosis not present

## 2019-10-18 DIAGNOSIS — T85898A Other specified complication of other internal prosthetic devices, implants and grafts, initial encounter: Secondary | ICD-10-CM | POA: Diagnosis not present

## 2019-10-19 DIAGNOSIS — D509 Iron deficiency anemia, unspecified: Secondary | ICD-10-CM | POA: Diagnosis not present

## 2019-10-19 DIAGNOSIS — D631 Anemia in chronic kidney disease: Secondary | ICD-10-CM | POA: Diagnosis not present

## 2019-10-19 DIAGNOSIS — Z992 Dependence on renal dialysis: Secondary | ICD-10-CM | POA: Diagnosis not present

## 2019-10-19 DIAGNOSIS — N2581 Secondary hyperparathyroidism of renal origin: Secondary | ICD-10-CM | POA: Diagnosis not present

## 2019-10-19 DIAGNOSIS — T85898A Other specified complication of other internal prosthetic devices, implants and grafts, initial encounter: Secondary | ICD-10-CM | POA: Diagnosis not present

## 2019-10-19 DIAGNOSIS — N186 End stage renal disease: Secondary | ICD-10-CM | POA: Diagnosis not present

## 2019-10-20 DIAGNOSIS — N2581 Secondary hyperparathyroidism of renal origin: Secondary | ICD-10-CM | POA: Diagnosis not present

## 2019-10-20 DIAGNOSIS — N186 End stage renal disease: Secondary | ICD-10-CM | POA: Diagnosis not present

## 2019-10-20 DIAGNOSIS — D631 Anemia in chronic kidney disease: Secondary | ICD-10-CM | POA: Diagnosis not present

## 2019-10-20 DIAGNOSIS — Z992 Dependence on renal dialysis: Secondary | ICD-10-CM | POA: Diagnosis not present

## 2019-10-20 DIAGNOSIS — T85898A Other specified complication of other internal prosthetic devices, implants and grafts, initial encounter: Secondary | ICD-10-CM | POA: Diagnosis not present

## 2019-10-20 DIAGNOSIS — E119 Type 2 diabetes mellitus without complications: Secondary | ICD-10-CM | POA: Diagnosis not present

## 2019-10-20 DIAGNOSIS — D509 Iron deficiency anemia, unspecified: Secondary | ICD-10-CM | POA: Diagnosis not present

## 2019-10-20 DIAGNOSIS — Z794 Long term (current) use of insulin: Secondary | ICD-10-CM | POA: Diagnosis not present

## 2019-10-21 DIAGNOSIS — N390 Urinary tract infection, site not specified: Secondary | ICD-10-CM | POA: Diagnosis not present

## 2019-10-21 DIAGNOSIS — N186 End stage renal disease: Secondary | ICD-10-CM | POA: Diagnosis not present

## 2019-10-21 DIAGNOSIS — T85898A Other specified complication of other internal prosthetic devices, implants and grafts, initial encounter: Secondary | ICD-10-CM | POA: Diagnosis not present

## 2019-10-21 DIAGNOSIS — D631 Anemia in chronic kidney disease: Secondary | ICD-10-CM | POA: Diagnosis not present

## 2019-10-21 DIAGNOSIS — Z992 Dependence on renal dialysis: Secondary | ICD-10-CM | POA: Diagnosis not present

## 2019-10-21 DIAGNOSIS — D509 Iron deficiency anemia, unspecified: Secondary | ICD-10-CM | POA: Diagnosis not present

## 2019-10-21 DIAGNOSIS — N2581 Secondary hyperparathyroidism of renal origin: Secondary | ICD-10-CM | POA: Diagnosis not present

## 2019-10-22 DIAGNOSIS — D631 Anemia in chronic kidney disease: Secondary | ICD-10-CM | POA: Diagnosis not present

## 2019-10-22 DIAGNOSIS — N2581 Secondary hyperparathyroidism of renal origin: Secondary | ICD-10-CM | POA: Diagnosis not present

## 2019-10-22 DIAGNOSIS — D509 Iron deficiency anemia, unspecified: Secondary | ICD-10-CM | POA: Diagnosis not present

## 2019-10-22 DIAGNOSIS — N186 End stage renal disease: Secondary | ICD-10-CM | POA: Diagnosis not present

## 2019-10-22 DIAGNOSIS — Z992 Dependence on renal dialysis: Secondary | ICD-10-CM | POA: Diagnosis not present

## 2019-10-22 DIAGNOSIS — T85898A Other specified complication of other internal prosthetic devices, implants and grafts, initial encounter: Secondary | ICD-10-CM | POA: Diagnosis not present

## 2019-10-23 DIAGNOSIS — N2581 Secondary hyperparathyroidism of renal origin: Secondary | ICD-10-CM | POA: Diagnosis not present

## 2019-10-23 DIAGNOSIS — D509 Iron deficiency anemia, unspecified: Secondary | ICD-10-CM | POA: Diagnosis not present

## 2019-10-23 DIAGNOSIS — Z992 Dependence on renal dialysis: Secondary | ICD-10-CM | POA: Diagnosis not present

## 2019-10-23 DIAGNOSIS — D631 Anemia in chronic kidney disease: Secondary | ICD-10-CM | POA: Diagnosis not present

## 2019-10-23 DIAGNOSIS — N186 End stage renal disease: Secondary | ICD-10-CM | POA: Diagnosis not present

## 2019-10-23 DIAGNOSIS — T85898A Other specified complication of other internal prosthetic devices, implants and grafts, initial encounter: Secondary | ICD-10-CM | POA: Diagnosis not present

## 2019-10-24 DIAGNOSIS — N2581 Secondary hyperparathyroidism of renal origin: Secondary | ICD-10-CM | POA: Diagnosis not present

## 2019-10-24 DIAGNOSIS — T85898A Other specified complication of other internal prosthetic devices, implants and grafts, initial encounter: Secondary | ICD-10-CM | POA: Diagnosis not present

## 2019-10-24 DIAGNOSIS — D631 Anemia in chronic kidney disease: Secondary | ICD-10-CM | POA: Diagnosis not present

## 2019-10-24 DIAGNOSIS — D509 Iron deficiency anemia, unspecified: Secondary | ICD-10-CM | POA: Diagnosis not present

## 2019-10-24 DIAGNOSIS — Z992 Dependence on renal dialysis: Secondary | ICD-10-CM | POA: Diagnosis not present

## 2019-10-24 DIAGNOSIS — N186 End stage renal disease: Secondary | ICD-10-CM | POA: Diagnosis not present

## 2019-10-25 DIAGNOSIS — N2581 Secondary hyperparathyroidism of renal origin: Secondary | ICD-10-CM | POA: Diagnosis not present

## 2019-10-25 DIAGNOSIS — D509 Iron deficiency anemia, unspecified: Secondary | ICD-10-CM | POA: Diagnosis not present

## 2019-10-25 DIAGNOSIS — D631 Anemia in chronic kidney disease: Secondary | ICD-10-CM | POA: Diagnosis not present

## 2019-10-25 DIAGNOSIS — N186 End stage renal disease: Secondary | ICD-10-CM | POA: Diagnosis not present

## 2019-10-25 DIAGNOSIS — T85898A Other specified complication of other internal prosthetic devices, implants and grafts, initial encounter: Secondary | ICD-10-CM | POA: Diagnosis not present

## 2019-10-25 DIAGNOSIS — Z992 Dependence on renal dialysis: Secondary | ICD-10-CM | POA: Diagnosis not present

## 2019-10-26 DIAGNOSIS — D509 Iron deficiency anemia, unspecified: Secondary | ICD-10-CM | POA: Diagnosis not present

## 2019-10-26 DIAGNOSIS — T85898A Other specified complication of other internal prosthetic devices, implants and grafts, initial encounter: Secondary | ICD-10-CM | POA: Diagnosis not present

## 2019-10-26 DIAGNOSIS — N186 End stage renal disease: Secondary | ICD-10-CM | POA: Diagnosis not present

## 2019-10-26 DIAGNOSIS — Z992 Dependence on renal dialysis: Secondary | ICD-10-CM | POA: Diagnosis not present

## 2019-10-26 DIAGNOSIS — N2581 Secondary hyperparathyroidism of renal origin: Secondary | ICD-10-CM | POA: Diagnosis not present

## 2019-10-26 DIAGNOSIS — D631 Anemia in chronic kidney disease: Secondary | ICD-10-CM | POA: Diagnosis not present

## 2019-10-27 DIAGNOSIS — D631 Anemia in chronic kidney disease: Secondary | ICD-10-CM | POA: Diagnosis not present

## 2019-10-27 DIAGNOSIS — D509 Iron deficiency anemia, unspecified: Secondary | ICD-10-CM | POA: Diagnosis not present

## 2019-10-27 DIAGNOSIS — N2581 Secondary hyperparathyroidism of renal origin: Secondary | ICD-10-CM | POA: Diagnosis not present

## 2019-10-27 DIAGNOSIS — Z992 Dependence on renal dialysis: Secondary | ICD-10-CM | POA: Diagnosis not present

## 2019-10-27 DIAGNOSIS — N186 End stage renal disease: Secondary | ICD-10-CM | POA: Diagnosis not present

## 2019-10-27 DIAGNOSIS — T85898A Other specified complication of other internal prosthetic devices, implants and grafts, initial encounter: Secondary | ICD-10-CM | POA: Diagnosis not present

## 2019-10-28 DIAGNOSIS — D509 Iron deficiency anemia, unspecified: Secondary | ICD-10-CM | POA: Diagnosis not present

## 2019-10-28 DIAGNOSIS — N2581 Secondary hyperparathyroidism of renal origin: Secondary | ICD-10-CM | POA: Diagnosis not present

## 2019-10-28 DIAGNOSIS — Z992 Dependence on renal dialysis: Secondary | ICD-10-CM | POA: Diagnosis not present

## 2019-10-28 DIAGNOSIS — D631 Anemia in chronic kidney disease: Secondary | ICD-10-CM | POA: Diagnosis not present

## 2019-10-28 DIAGNOSIS — N186 End stage renal disease: Secondary | ICD-10-CM | POA: Diagnosis not present

## 2019-10-28 DIAGNOSIS — T85898A Other specified complication of other internal prosthetic devices, implants and grafts, initial encounter: Secondary | ICD-10-CM | POA: Diagnosis not present

## 2019-10-29 DIAGNOSIS — T85898A Other specified complication of other internal prosthetic devices, implants and grafts, initial encounter: Secondary | ICD-10-CM | POA: Diagnosis not present

## 2019-10-29 DIAGNOSIS — D631 Anemia in chronic kidney disease: Secondary | ICD-10-CM | POA: Diagnosis not present

## 2019-10-29 DIAGNOSIS — N186 End stage renal disease: Secondary | ICD-10-CM | POA: Diagnosis not present

## 2019-10-29 DIAGNOSIS — Z992 Dependence on renal dialysis: Secondary | ICD-10-CM | POA: Diagnosis not present

## 2019-10-29 DIAGNOSIS — N2581 Secondary hyperparathyroidism of renal origin: Secondary | ICD-10-CM | POA: Diagnosis not present

## 2019-10-29 DIAGNOSIS — D509 Iron deficiency anemia, unspecified: Secondary | ICD-10-CM | POA: Diagnosis not present

## 2019-10-30 DIAGNOSIS — N2581 Secondary hyperparathyroidism of renal origin: Secondary | ICD-10-CM | POA: Diagnosis not present

## 2019-10-30 DIAGNOSIS — D631 Anemia in chronic kidney disease: Secondary | ICD-10-CM | POA: Diagnosis not present

## 2019-10-30 DIAGNOSIS — Z992 Dependence on renal dialysis: Secondary | ICD-10-CM | POA: Diagnosis not present

## 2019-10-30 DIAGNOSIS — N186 End stage renal disease: Secondary | ICD-10-CM | POA: Diagnosis not present

## 2019-10-30 DIAGNOSIS — T85898A Other specified complication of other internal prosthetic devices, implants and grafts, initial encounter: Secondary | ICD-10-CM | POA: Diagnosis not present

## 2019-10-30 DIAGNOSIS — D509 Iron deficiency anemia, unspecified: Secondary | ICD-10-CM | POA: Diagnosis not present

## 2019-10-31 DIAGNOSIS — D631 Anemia in chronic kidney disease: Secondary | ICD-10-CM | POA: Diagnosis not present

## 2019-10-31 DIAGNOSIS — Z992 Dependence on renal dialysis: Secondary | ICD-10-CM | POA: Diagnosis not present

## 2019-10-31 DIAGNOSIS — N2581 Secondary hyperparathyroidism of renal origin: Secondary | ICD-10-CM | POA: Diagnosis not present

## 2019-10-31 DIAGNOSIS — D509 Iron deficiency anemia, unspecified: Secondary | ICD-10-CM | POA: Diagnosis not present

## 2019-10-31 DIAGNOSIS — N186 End stage renal disease: Secondary | ICD-10-CM | POA: Diagnosis not present

## 2019-10-31 DIAGNOSIS — T85898A Other specified complication of other internal prosthetic devices, implants and grafts, initial encounter: Secondary | ICD-10-CM | POA: Diagnosis not present

## 2019-11-01 DIAGNOSIS — N186 End stage renal disease: Secondary | ICD-10-CM | POA: Diagnosis not present

## 2019-11-01 DIAGNOSIS — D631 Anemia in chronic kidney disease: Secondary | ICD-10-CM | POA: Diagnosis not present

## 2019-11-01 DIAGNOSIS — D509 Iron deficiency anemia, unspecified: Secondary | ICD-10-CM | POA: Diagnosis not present

## 2019-11-01 DIAGNOSIS — N2581 Secondary hyperparathyroidism of renal origin: Secondary | ICD-10-CM | POA: Diagnosis not present

## 2019-11-01 DIAGNOSIS — Z992 Dependence on renal dialysis: Secondary | ICD-10-CM | POA: Diagnosis not present

## 2019-11-01 DIAGNOSIS — T85898A Other specified complication of other internal prosthetic devices, implants and grafts, initial encounter: Secondary | ICD-10-CM | POA: Diagnosis not present

## 2019-11-02 DIAGNOSIS — D631 Anemia in chronic kidney disease: Secondary | ICD-10-CM | POA: Diagnosis not present

## 2019-11-02 DIAGNOSIS — D509 Iron deficiency anemia, unspecified: Secondary | ICD-10-CM | POA: Diagnosis not present

## 2019-11-02 DIAGNOSIS — Z992 Dependence on renal dialysis: Secondary | ICD-10-CM | POA: Diagnosis not present

## 2019-11-02 DIAGNOSIS — N186 End stage renal disease: Secondary | ICD-10-CM | POA: Diagnosis not present

## 2019-11-02 DIAGNOSIS — N2581 Secondary hyperparathyroidism of renal origin: Secondary | ICD-10-CM | POA: Diagnosis not present

## 2019-11-02 DIAGNOSIS — T85898A Other specified complication of other internal prosthetic devices, implants and grafts, initial encounter: Secondary | ICD-10-CM | POA: Diagnosis not present

## 2019-11-03 DIAGNOSIS — D509 Iron deficiency anemia, unspecified: Secondary | ICD-10-CM | POA: Diagnosis not present

## 2019-11-03 DIAGNOSIS — N186 End stage renal disease: Secondary | ICD-10-CM | POA: Diagnosis not present

## 2019-11-03 DIAGNOSIS — N2581 Secondary hyperparathyroidism of renal origin: Secondary | ICD-10-CM | POA: Diagnosis not present

## 2019-11-03 DIAGNOSIS — T85898A Other specified complication of other internal prosthetic devices, implants and grafts, initial encounter: Secondary | ICD-10-CM | POA: Diagnosis not present

## 2019-11-03 DIAGNOSIS — D631 Anemia in chronic kidney disease: Secondary | ICD-10-CM | POA: Diagnosis not present

## 2019-11-03 DIAGNOSIS — Z992 Dependence on renal dialysis: Secondary | ICD-10-CM | POA: Diagnosis not present

## 2019-11-04 DIAGNOSIS — T85898A Other specified complication of other internal prosthetic devices, implants and grafts, initial encounter: Secondary | ICD-10-CM | POA: Diagnosis not present

## 2019-11-04 DIAGNOSIS — N2581 Secondary hyperparathyroidism of renal origin: Secondary | ICD-10-CM | POA: Diagnosis not present

## 2019-11-04 DIAGNOSIS — D631 Anemia in chronic kidney disease: Secondary | ICD-10-CM | POA: Diagnosis not present

## 2019-11-04 DIAGNOSIS — Z992 Dependence on renal dialysis: Secondary | ICD-10-CM | POA: Diagnosis not present

## 2019-11-04 DIAGNOSIS — N186 End stage renal disease: Secondary | ICD-10-CM | POA: Diagnosis not present

## 2019-11-04 DIAGNOSIS — D509 Iron deficiency anemia, unspecified: Secondary | ICD-10-CM | POA: Diagnosis not present

## 2019-11-05 DIAGNOSIS — D631 Anemia in chronic kidney disease: Secondary | ICD-10-CM | POA: Diagnosis not present

## 2019-11-05 DIAGNOSIS — T85898A Other specified complication of other internal prosthetic devices, implants and grafts, initial encounter: Secondary | ICD-10-CM | POA: Diagnosis not present

## 2019-11-05 DIAGNOSIS — N2581 Secondary hyperparathyroidism of renal origin: Secondary | ICD-10-CM | POA: Diagnosis not present

## 2019-11-05 DIAGNOSIS — Z992 Dependence on renal dialysis: Secondary | ICD-10-CM | POA: Diagnosis not present

## 2019-11-05 DIAGNOSIS — N186 End stage renal disease: Secondary | ICD-10-CM | POA: Diagnosis not present

## 2019-11-05 DIAGNOSIS — D509 Iron deficiency anemia, unspecified: Secondary | ICD-10-CM | POA: Diagnosis not present

## 2019-11-06 DIAGNOSIS — Z992 Dependence on renal dialysis: Secondary | ICD-10-CM | POA: Diagnosis not present

## 2019-11-06 DIAGNOSIS — D509 Iron deficiency anemia, unspecified: Secondary | ICD-10-CM | POA: Diagnosis not present

## 2019-11-06 DIAGNOSIS — T85898A Other specified complication of other internal prosthetic devices, implants and grafts, initial encounter: Secondary | ICD-10-CM | POA: Diagnosis not present

## 2019-11-06 DIAGNOSIS — D631 Anemia in chronic kidney disease: Secondary | ICD-10-CM | POA: Diagnosis not present

## 2019-11-06 DIAGNOSIS — N2581 Secondary hyperparathyroidism of renal origin: Secondary | ICD-10-CM | POA: Diagnosis not present

## 2019-11-06 DIAGNOSIS — N186 End stage renal disease: Secondary | ICD-10-CM | POA: Diagnosis not present

## 2019-11-07 DIAGNOSIS — D509 Iron deficiency anemia, unspecified: Secondary | ICD-10-CM | POA: Diagnosis not present

## 2019-11-07 DIAGNOSIS — T85898A Other specified complication of other internal prosthetic devices, implants and grafts, initial encounter: Secondary | ICD-10-CM | POA: Diagnosis not present

## 2019-11-07 DIAGNOSIS — D631 Anemia in chronic kidney disease: Secondary | ICD-10-CM | POA: Diagnosis not present

## 2019-11-07 DIAGNOSIS — Z992 Dependence on renal dialysis: Secondary | ICD-10-CM | POA: Diagnosis not present

## 2019-11-07 DIAGNOSIS — N186 End stage renal disease: Secondary | ICD-10-CM | POA: Diagnosis not present

## 2019-11-07 DIAGNOSIS — N2581 Secondary hyperparathyroidism of renal origin: Secondary | ICD-10-CM | POA: Diagnosis not present

## 2019-11-08 DIAGNOSIS — Z992 Dependence on renal dialysis: Secondary | ICD-10-CM | POA: Diagnosis not present

## 2019-11-08 DIAGNOSIS — N2581 Secondary hyperparathyroidism of renal origin: Secondary | ICD-10-CM | POA: Diagnosis not present

## 2019-11-08 DIAGNOSIS — D509 Iron deficiency anemia, unspecified: Secondary | ICD-10-CM | POA: Diagnosis not present

## 2019-11-08 DIAGNOSIS — D631 Anemia in chronic kidney disease: Secondary | ICD-10-CM | POA: Diagnosis not present

## 2019-11-08 DIAGNOSIS — T85898A Other specified complication of other internal prosthetic devices, implants and grafts, initial encounter: Secondary | ICD-10-CM | POA: Diagnosis not present

## 2019-11-08 DIAGNOSIS — N186 End stage renal disease: Secondary | ICD-10-CM | POA: Diagnosis not present

## 2019-11-09 DIAGNOSIS — D631 Anemia in chronic kidney disease: Secondary | ICD-10-CM | POA: Diagnosis not present

## 2019-11-09 DIAGNOSIS — T85898A Other specified complication of other internal prosthetic devices, implants and grafts, initial encounter: Secondary | ICD-10-CM | POA: Diagnosis not present

## 2019-11-09 DIAGNOSIS — N2581 Secondary hyperparathyroidism of renal origin: Secondary | ICD-10-CM | POA: Diagnosis not present

## 2019-11-09 DIAGNOSIS — Z992 Dependence on renal dialysis: Secondary | ICD-10-CM | POA: Diagnosis not present

## 2019-11-09 DIAGNOSIS — N186 End stage renal disease: Secondary | ICD-10-CM | POA: Diagnosis not present

## 2019-11-09 DIAGNOSIS — D509 Iron deficiency anemia, unspecified: Secondary | ICD-10-CM | POA: Diagnosis not present

## 2019-11-10 DIAGNOSIS — D631 Anemia in chronic kidney disease: Secondary | ICD-10-CM | POA: Diagnosis not present

## 2019-11-10 DIAGNOSIS — D509 Iron deficiency anemia, unspecified: Secondary | ICD-10-CM | POA: Diagnosis not present

## 2019-11-10 DIAGNOSIS — N2581 Secondary hyperparathyroidism of renal origin: Secondary | ICD-10-CM | POA: Diagnosis not present

## 2019-11-10 DIAGNOSIS — Z992 Dependence on renal dialysis: Secondary | ICD-10-CM | POA: Diagnosis not present

## 2019-11-10 DIAGNOSIS — N186 End stage renal disease: Secondary | ICD-10-CM | POA: Diagnosis not present

## 2019-11-10 DIAGNOSIS — T85898A Other specified complication of other internal prosthetic devices, implants and grafts, initial encounter: Secondary | ICD-10-CM | POA: Diagnosis not present

## 2019-11-11 ENCOUNTER — Other Ambulatory Visit: Payer: Self-pay | Admitting: "Endocrinology

## 2019-11-11 ENCOUNTER — Telehealth: Payer: Self-pay | Admitting: Internal Medicine

## 2019-11-11 DIAGNOSIS — D631 Anemia in chronic kidney disease: Secondary | ICD-10-CM | POA: Diagnosis not present

## 2019-11-11 DIAGNOSIS — B351 Tinea unguium: Secondary | ICD-10-CM | POA: Diagnosis not present

## 2019-11-11 DIAGNOSIS — N186 End stage renal disease: Secondary | ICD-10-CM | POA: Diagnosis not present

## 2019-11-11 DIAGNOSIS — D509 Iron deficiency anemia, unspecified: Secondary | ICD-10-CM | POA: Diagnosis not present

## 2019-11-11 DIAGNOSIS — E1142 Type 2 diabetes mellitus with diabetic polyneuropathy: Secondary | ICD-10-CM | POA: Diagnosis not present

## 2019-11-11 DIAGNOSIS — T85898A Other specified complication of other internal prosthetic devices, implants and grafts, initial encounter: Secondary | ICD-10-CM | POA: Diagnosis not present

## 2019-11-11 DIAGNOSIS — Z992 Dependence on renal dialysis: Secondary | ICD-10-CM | POA: Diagnosis not present

## 2019-11-11 DIAGNOSIS — N2581 Secondary hyperparathyroidism of renal origin: Secondary | ICD-10-CM | POA: Diagnosis not present

## 2019-11-11 NOTE — Telephone Encounter (Signed)
Patient's wife called and states that she will be coming with the patient to his appointment on Friday 11/14/19. She states that they have both been vaccinated.

## 2019-11-11 NOTE — Telephone Encounter (Signed)
Okay to accompany. Will make primary RN aware

## 2019-11-12 DIAGNOSIS — T85898A Other specified complication of other internal prosthetic devices, implants and grafts, initial encounter: Secondary | ICD-10-CM | POA: Diagnosis not present

## 2019-11-12 DIAGNOSIS — Z992 Dependence on renal dialysis: Secondary | ICD-10-CM | POA: Diagnosis not present

## 2019-11-12 DIAGNOSIS — N2581 Secondary hyperparathyroidism of renal origin: Secondary | ICD-10-CM | POA: Diagnosis not present

## 2019-11-12 DIAGNOSIS — N186 End stage renal disease: Secondary | ICD-10-CM | POA: Diagnosis not present

## 2019-11-12 DIAGNOSIS — D509 Iron deficiency anemia, unspecified: Secondary | ICD-10-CM | POA: Diagnosis not present

## 2019-11-12 DIAGNOSIS — D631 Anemia in chronic kidney disease: Secondary | ICD-10-CM | POA: Diagnosis not present

## 2019-11-13 DIAGNOSIS — N186 End stage renal disease: Secondary | ICD-10-CM | POA: Diagnosis not present

## 2019-11-13 DIAGNOSIS — Z992 Dependence on renal dialysis: Secondary | ICD-10-CM | POA: Diagnosis not present

## 2019-11-13 DIAGNOSIS — N2581 Secondary hyperparathyroidism of renal origin: Secondary | ICD-10-CM | POA: Diagnosis not present

## 2019-11-13 DIAGNOSIS — T85898A Other specified complication of other internal prosthetic devices, implants and grafts, initial encounter: Secondary | ICD-10-CM | POA: Diagnosis not present

## 2019-11-13 DIAGNOSIS — D631 Anemia in chronic kidney disease: Secondary | ICD-10-CM | POA: Diagnosis not present

## 2019-11-13 DIAGNOSIS — D509 Iron deficiency anemia, unspecified: Secondary | ICD-10-CM | POA: Diagnosis not present

## 2019-11-14 ENCOUNTER — Ambulatory Visit (INDEPENDENT_AMBULATORY_CARE_PROVIDER_SITE_OTHER): Payer: Medicare Other | Admitting: Internal Medicine

## 2019-11-14 ENCOUNTER — Other Ambulatory Visit: Payer: Self-pay

## 2019-11-14 ENCOUNTER — Encounter: Payer: Self-pay | Admitting: Internal Medicine

## 2019-11-14 VITALS — BP 86/56 | HR 97 | Wt 245.0 lb

## 2019-11-14 DIAGNOSIS — N186 End stage renal disease: Secondary | ICD-10-CM | POA: Diagnosis not present

## 2019-11-14 DIAGNOSIS — D509 Iron deficiency anemia, unspecified: Secondary | ICD-10-CM | POA: Diagnosis not present

## 2019-11-14 DIAGNOSIS — I950 Idiopathic hypotension: Secondary | ICD-10-CM | POA: Diagnosis not present

## 2019-11-14 DIAGNOSIS — I2510 Atherosclerotic heart disease of native coronary artery without angina pectoris: Secondary | ICD-10-CM

## 2019-11-14 DIAGNOSIS — Z9989 Dependence on other enabling machines and devices: Secondary | ICD-10-CM | POA: Diagnosis not present

## 2019-11-14 DIAGNOSIS — T85898A Other specified complication of other internal prosthetic devices, implants and grafts, initial encounter: Secondary | ICD-10-CM | POA: Diagnosis not present

## 2019-11-14 DIAGNOSIS — D631 Anemia in chronic kidney disease: Secondary | ICD-10-CM | POA: Diagnosis not present

## 2019-11-14 DIAGNOSIS — N2581 Secondary hyperparathyroidism of renal origin: Secondary | ICD-10-CM | POA: Diagnosis not present

## 2019-11-14 DIAGNOSIS — Z992 Dependence on renal dialysis: Secondary | ICD-10-CM | POA: Diagnosis not present

## 2019-11-14 DIAGNOSIS — G4733 Obstructive sleep apnea (adult) (pediatric): Secondary | ICD-10-CM | POA: Diagnosis not present

## 2019-11-14 MED ORDER — AMLODIPINE BESYLATE 5 MG PO TABS: 5.0000 mg | ORAL_TABLET | Freq: Every day | ORAL | 3 refills | Status: DC

## 2019-11-14 NOTE — Patient Instructions (Signed)
Medication Instructions:  DECREASE amlodipine to 5mg  daily Continue all other current medications  *If you need a refill on your cardiac medications before your next appointment, please call your pharmacy*    Follow-Up: At [facility], you and your health needs are our priority.  As part of our continuing mission to provide you with exceptional heart care, we have created designated Provider Care Teams.  These Care Teams include your primary Cardiologist (physician) and Advanced Practice Providers (APPs -  Physician Assistants and Nurse Practitioners) who all work together to provide you with the care you need, when you need it.  We recommend signing up for the patient portal called "MyChart".  Sign up information is provided on this After Visit Summary.  MyChart is used to connect with patients for Virtual Visits (Telemedicine).  Patients are able to view lab/test results, encounter notes, upcoming appointments, etc.  Non-urgent messages can be sent to your provider as well.   To learn more about what you can do with MyChart, go to [email or web address].    Your next appointment:   2 month(s)  The format for your next appointment:   In Person  Provider:   You may see Dr. Lyman Bishop or one of the following Advanced Practice Providers on your designated Care Team:    Almyra Deforest, PA-C  Fabian Sharp, [place] or   Roby Lofts, [place]    Other Instructions  Please check your BP at home

## 2019-11-14 NOTE — Progress Notes (Signed)
OFFICE NOTE  Chief Complaint:  Fatigue  Primary Care Physician: [name], MD  HPI:  [name] [name] Is a pleasant 69 year old male with a history of insulin-dependent diabetes, dyslipidemia, and hypertension. He's been a diabetic since 1999. He's been on insulin for only 3-4 years. He was a former patient of Dr. Rollene Fare who ordered a stress test in 2014. This demonstrated reversible inferoapical ischemia and was read as moderate risk. He ultimately underwent heart catheterization by Dr. Ellyn Hack on 03/28/2013 which demonstrated a small, possibly minimal aneurysmal bleb in the LAD but no significant disease. There is a dominant RCA with no disease. Essentially there was no significant obstructive coronary disease. At the time his EKG did show inferior T-wave abnormalities and very small R waves but no evidence for Q waves. Today he is referred back for abnormalities EKG which directly compared to his old EKG shows no significant change. There is an IVCD and very small R waves inferiorly, which the computer may have this read as inferior infarct. I do not see any significant change compared to his previous studies. He denies any new or worsening chest pain or shortness of breath.  Mr. Mcnutt returns today for follow-up. Overall he is feeling fairly well. He denies any chest pain or shortness of breath. Blood pressure is slightly elevated to 162/94, however recheck was 140/84. Please not been able to lose much weight. He is on chronic Plavix therapy due to what was thought to be plaque rupture but did not require stent by catheterization in 2014. This was a preferred agent over aspirin and he's remained on that, but certainly could come off of that if he were to undergo any procedures he is contemplating a penile implant for erectile dysfunction.  05/01/2016  Mr. Linck was seen today in follow-up. He recently saw [name], NP, in April 2016 for follow-up. Blood pressure was somewhat  elevated at the time. It was not is ambulatory due to problems with his knee. There was a discussion about stopping Plavix and keeping him on aspirin however he remained on Plavix. Unfortunately he has stage IV chronic kidney disease and there is discussion about placement of a fistula. He scheduled for upper extremity Dopplers at the end of this month and follow-up with Dr. Oneida Alar. He had a number of questions today regarding dialysis and his fistula, but I reassured him based on his creatinine that he should consider this even if his numbers seem to have stabilized. He denies any chest pain or worsening shortness of breath. He did get new CPAP equipment but reports intermittent compliance with it. He still has some daytime fatigue.  10/27/2016  Mr. Paskett was seen today in follow-up. He underwent recent placement of a fistula left upper arm. This has a positive thrill today. Blood pressure is elevated somewhat 152/88. Recently his creatinine was increased up to 5.08 which is progression from 3.74 in December. I suspect he will have to start on dialysis soon. Otherwise hemoglobin A1c is stable around 7.0. He is cholesterol also has looked reasonably well controlled with total cholesterol 146, HDL-C 36, LDL-C 72 and triglycerides 190.  11/13/2017  Mr. Corrales was seen today for routine follow-up.  Overall he is doing very well.  He denies any chest pain or worsening shortness of breath.  His creatinine remains around 5.7.  This is been fairly stable and he is managed to remain off dialysis.  He does have left upper extremity fistula.  Hemoglobin A1c is well-controlled  at 6.2 and is followed by Dr. Dorris Fetch in [place].  His total cholesterol was 110, HDL 38, LDL 54 triglycerides 101, demonstrating excellent control on simvastatin.  Blood pressure is at goal today 136/78.  11/14/2019  Mr. Phegley is seen today in follow-up.  He continues on home peritoneal dialysis.  Blood pressure was noted to be very low  today at 86/56.  He denies any fevers or chills or other signs or symptoms of infection.  His dialysis center had recommended stopping his hydralazine.  Despite this his blood pressure remains low.  He is also on amlodipine, metoprolol and losartan.  He reports some fatigue but denies chest pain or shortness of breath.  PMHx:  Past Medical History:  Diagnosis Date  . Anemia   . Arthritis   . Asthma   . Bell palsy   . CAD (coronary artery disease)    a. 2014 MV: abnl w/ infap ischemia; b. 03/2013 Cath: aneurysmal bleb in the LAD w/ otw nonobs dzs-->Med Rx.  . Chronic back pain   . Chronic knee pain    a. 09/2015 s/p R TKA.  Marland Kitchen Chronic pain   . Chronic shoulder pain   . Chronic sinusitis   . CKD (chronic kidney disease), stage IV ([place])    stage 4 per office visit note of Dr Lowanda Foster on 05/2015   . COPD (chronic obstructive pulmonary disease) ([place])   . Diabetes mellitus without complication ([place])    type II   . Essential hypertension   . GERD (gastroesophageal reflux disease)   . Gout   . Gout   . Hepatomegaly    noted on noncontrast CT 2015  . History of hiatal hernia   . Hyperlipidemia   . Lateral meniscus tear   . Obesity    [place]  . Obstructive sleep apnea    does not use cpap   . On home oxygen therapy    uses 2l when is going somewhere per patient   . PUD (peptic ulcer disease)    remote, reports f/u EGD about 8 years ago unremarkable   . Reactive airway disease    related to exposure to chemical during 9/11  . Renal insufficiency   . Sinusitis   . Vitamin D deficiency     Past Surgical History:  Procedure Laterality Date  . ASAD LT SHOULDER  12/2008   left shoulder  . AV FISTULA PLACEMENT Left 08/09/2016   Procedure: BRACHIOCEPHALIC ARTERIOVENOUS (AV) FISTULA CREATION LEFT ARM;  Surgeon: [name], MD;  Location: [facility];  Service: Vascular;  Laterality: Left;  . CAPD INSERTION N/A 10/07/2018   Procedure: LAPAROSCOPIC PERITONEAL CATHETER PLACEMENT;  Surgeon:  Kieth Brightly Arta Bruce, MD;  Location: [facility];  Service: General;  Laterality: N/A;  . CATARACT EXTRACTION W/PHACO Left 03/28/2016   Procedure: CATARACT EXTRACTION PHACO AND INTRAOCULAR LENS PLACEMENT LEFT EYE;  Surgeon: [name], MD;  Location: [facility];  Service: Ophthalmology;  Laterality: Left;  CDE: 4.77  . CATARACT EXTRACTION W/PHACO Right 04/11/2016   Procedure: CATARACT EXTRACTION PHACO AND INTRAOCULAR LENS PLACEMENT RIGHT EYE; CDE:  4.74;  Surgeon: [name], MD;  Location: [facility];  Service: Ophthalmology;  Laterality: Right;  . COLONOSCOPY  10/2008   Fields: Rectal polyp obliterated, not retrieved, hemorrhoids, single ascending colon diverticulum near the CV. Next colonoscopy April 2020  . COLONOSCOPY N/A 12/25/2014   SLF: 1. Colorectal polyps (2) removed 2. Small internal hemorrhoids 3. the left colon is severely redundant  . DOPPLER ECHOCARDIOGRAPHY    .  ESOPHAGOGASTRODUODENOSCOPY N/A 12/25/2014   SLF: 1. Anemia most likely due to CRI, gastritis, gastric polyps 2. Moderate non-erosive gastriits and mild duodenitis.  3.TWo large gstric polyps removed.   Marland Kitchen EYE SURGERY  12/22/2010   tear duct probing-[place]  . FOREIGN BODY REMOVAL  03/29/2011   Procedure: REMOVAL FOREIGN BODY EXTREMITY;  Surgeon: Arther Abbott, MD;  Location: [facility];  Service: Orthopedics;  Laterality: Right;  Removal Foreign Body Right Thumb  . IR FLUORO GUIDE CV LINE RIGHT  08/06/2018  . IR US GUIDE VASC ACCESS RIGHT  08/06/2018  . KNEE ARTHROSCOPY  10/2007   left  . KNEE ARTHROSCOPY WITH LATERAL MENISECTOMY Right 10/14/2015   Procedure: LEFT KNEE ARTHROSCOPY WITH PARTIAL LATERAL MENISECTOMY;  Surgeon: Carole Civil, MD;  Location: [facility];  Service: Orthopedics;  Laterality: Right;  . LEFT HEART CATHETERIZATION WITH CORONARY ANGIOGRAM N/A 03/28/2013   Procedure: LEFT HEART CATHETERIZATION WITH CORONARY ANGIOGRAM;  Surgeon: Leonie Man, MD;  Location: [facility] CATH LAB;  Service: Cardiovascular;  Laterality: N/A;  .  NM MYOVIEW LTD    . PENILE PROSTHESIS IMPLANT N/A 08/16/2015   Procedure: PENILE PROTHESIS INFLATABLE, three piece, Excisional biopsy of Penile ulcer, Penile molding;  Surgeon: Carolan Clines, MD;  Location: [facility];  Service: Urology;  Laterality: N/A;  . PENILE PROSTHESIS IMPLANT N/A 12/24/2017   Procedure: REMOVAL AND  REPLACEMENT  COLOPLAST PENILE PROSTHESIS;  Surgeon: Lucas Mallow, MD;  Location: [facility];  Service: Urology;  Laterality: N/A;  . QUADRICEPS TENDON REPAIR  07/21/2011   Procedure: REPAIR QUADRICEP TENDON;  Surgeon: Arther Abbott, MD;  Location: [facility];  Service: Orthopedics;  Laterality: Right;  . TOENAIL EXCISION     removed [id number]  . UMBILICAL HERNIA REPAIR  2007   [place]    FAMHx:  Family History  Problem Relation Age of Onset  . Hypertension Mother        MI  . Cancer Mother        breast   . Diabetes Mother   . Diabetes Father   . Hypertension Father   . Hypertension Sister   . Diabetes Sister   . Arthritis Other   . Asthma Other   . Lung disease Other   . Anesthesia problems Neg Hx   . Hypotension Neg Hx   . Malignant hyperthermia Neg Hx   . Pseudochol deficiency Neg Hx   . Colon cancer Neg Hx     SOCHx:   reports that he quit smoking about 9 years ago. His smoking use included cigarettes. He has a 25.00 pack-year smoking history. He has never used smokeless tobacco. He reports current alcohol use. He reports previous drug use. Drug: Marijuana.  ALLERGIES:  Allergies  Allergen Reactions  . Tramadol Hcl   . Opana [Oxymorphone Hcl] Itching  . Oxymorphone Itching  . Tramadol Itching    ROS: A comprehensive review of systems was negative.  HOME MEDS: Current Outpatient Medications  Medication Sig Dispense Refill  . allopurinol (ZYLOPRIM) 100 MG tablet Take 200 mg by mouth daily.     Marland Kitchen amLODipine (NORVASC) 10 MG tablet Take 10 mg by mouth daily.    Marland Kitchen aspirin EC 81 MG tablet Take 1 tablet (81 mg total) by mouth daily. 90 tablet 3    . calcitRIOL (ROCALTROL) 0.25 MCG capsule Take 0.25 mcg by mouth daily.    . cetirizine (ZYRTEC) 10 MG tablet Take 10 mg by mouth at bedtime as needed for allergies.     Marland Kitchen  DROPLET PEN NEEDLES 31G X 5 MM MISC INJECT SUBCUTANEOUSLY TWICE DAILY AS DIRECTED 200 each 2  . fluticasone (FLONASE) 50 MCG/ACT nasal spray Place 2 sprays into both nostrils daily. 16 g 0  . folic acid (FOLVITE) 1 MG tablet Take 1 tablet by mouth daily.    . insulin glargine (LANTUS SOLOSTAR) 100 UNIT/ML Solostar Pen Inject 20 Units into the skin at bedtime. INJECT  20 UNITS SUBCUTANEOUSLY AT BEDTIME 30 mL 0  . mometasone (NASONEX) 50 MCG/ACT nasal spray Place 2 sprays into the nose 2 (two) times daily as needed (allergies).     . pantoprazole (PROTONIX) 40 MG tablet TAKE 1 TABLET TWICE DAILY 30 MINUTES PRIOR TO MEALS 180 tablet 3  . simvastatin (ZOCOR) 20 MG tablet Take 20 mg by mouth every morning.     . Tiotropium Bromide-Olodaterol (STIOLTO RESPIMAT) 2.5-2.5 MCG/ACT AERS Inhale 2 puffs into the lungs daily. 4 g 0  . TRADJENTA 5 MG TABS tablet TAKE 1 TABLET EVERY DAY 90 tablet 0  . losartan (COZAAR) 100 MG tablet     . metoprolol succinate (TOPROL-XL) 50 MG 24 hr tablet     . NOVOLOG FLEXPEN 100 UNIT/ML FlexPen      No current facility-administered medications for this visit.    LABS/IMAGING: No results found for this or any previous visit (from the past 48 hour(s)). No results found.  VITALS: BP (!) 86/56   Pulse 97   Wt 245 lb (111.1 kg)   SpO2 96%   BMI 42.05 kg/m   EXAM: General appearance: alert and no distress Neck: no carotid bruit and no JVD Lungs: clear to auscultation bilaterally Heart: regular rate and rhythm, S1, S2 normal, no murmur, click, rub or gallop Abdomen: soft, non-tender; bowel sounds normal; no masses,  no organomegaly and morbidly obse Extremities: extremities normal, atraumatic, no cyanosis or edema Pulses: 2+ and symmetric Skin: Skin color, texture, turgor normal. No rashes or  lesions Neurologic: Grossly normal Psyvh: Normal  EKG: Sinus rhythm with first-degree AV block at 286 ms-personally reviewed  ASSESSMENT: 1. Hypotension 2. Mild coronary artery disease by cath in September 2014, question plaque rupture-on Plavix (no stent) 3. Insulin-dependent diabetes 4. Dyslipidemia-controlled 5. Hypertension 6. Morbid obesity 7. Erectile dysfunction - s/p implant 8. OSA on CPAP 9. ESRD - s/p LUE AV fistula, not yet on dialysis  PLAN: 1.   Mr. Bara is noted to be hypotensive today and this has been persistent for some time.  He had his hydralazine stopped and I would recommend decreasing his amlodipine from 10 to 5 mg today.  We may need to decrease it further.  I advised him to continue to monitor blood pressures.  He denies any signs or symptoms of infection although that is a concern with low blood pressure.  If this persists we may want to consider an echocardiogram to look for any LV dysfunction.  It is difficult to judge his volume status due to the fact that he is on near continuous dialysis.  Plan follow-up with me in 2 to 3 months.  Pixie Casino, MD, [facility], [place] Director of the [facility] Lip[facility]  Card[facility]ovascular [facility] Diplomate of the American Board of Clinical Lipidology Attending Cardiologist  Direct Dial: [phone]  Fax: [phone]  Website:  [email or web address] [email or web address] 11/14/2019, 3:01 PM

## 2019-11-15 DIAGNOSIS — Z992 Dependence on renal dialysis: Secondary | ICD-10-CM | POA: Diagnosis not present

## 2019-11-15 DIAGNOSIS — D631 Anemia in chronic kidney disease: Secondary | ICD-10-CM | POA: Diagnosis not present

## 2019-11-15 DIAGNOSIS — D509 Iron deficiency anemia, unspecified: Secondary | ICD-10-CM | POA: Diagnosis not present

## 2019-11-15 DIAGNOSIS — N186 End stage renal disease: Secondary | ICD-10-CM | POA: Diagnosis not present

## 2019-11-16 DIAGNOSIS — D509 Iron deficiency anemia, unspecified: Secondary | ICD-10-CM | POA: Diagnosis not present

## 2019-11-16 DIAGNOSIS — N186 End stage renal disease: Secondary | ICD-10-CM | POA: Diagnosis not present

## 2019-11-16 DIAGNOSIS — Z992 Dependence on renal dialysis: Secondary | ICD-10-CM | POA: Diagnosis not present

## 2019-11-16 DIAGNOSIS — D631 Anemia in chronic kidney disease: Secondary | ICD-10-CM | POA: Diagnosis not present

## 2019-11-17 DIAGNOSIS — D631 Anemia in chronic kidney disease: Secondary | ICD-10-CM | POA: Diagnosis not present

## 2019-11-17 DIAGNOSIS — Z992 Dependence on renal dialysis: Secondary | ICD-10-CM | POA: Diagnosis not present

## 2019-11-17 DIAGNOSIS — D509 Iron deficiency anemia, unspecified: Secondary | ICD-10-CM | POA: Diagnosis not present

## 2019-11-17 DIAGNOSIS — N186 End stage renal disease: Secondary | ICD-10-CM | POA: Diagnosis not present

## 2019-11-18 DIAGNOSIS — D631 Anemia in chronic kidney disease: Secondary | ICD-10-CM | POA: Diagnosis not present

## 2019-11-18 DIAGNOSIS — D509 Iron deficiency anemia, unspecified: Secondary | ICD-10-CM | POA: Diagnosis not present

## 2019-11-18 DIAGNOSIS — N186 End stage renal disease: Secondary | ICD-10-CM | POA: Diagnosis not present

## 2019-11-18 DIAGNOSIS — Z992 Dependence on renal dialysis: Secondary | ICD-10-CM | POA: Diagnosis not present

## 2019-11-19 DIAGNOSIS — N186 End stage renal disease: Secondary | ICD-10-CM | POA: Diagnosis not present

## 2019-11-19 DIAGNOSIS — D509 Iron deficiency anemia, unspecified: Secondary | ICD-10-CM | POA: Diagnosis not present

## 2019-11-19 DIAGNOSIS — D631 Anemia in chronic kidney disease: Secondary | ICD-10-CM | POA: Diagnosis not present

## 2019-11-19 DIAGNOSIS — Z992 Dependence on renal dialysis: Secondary | ICD-10-CM | POA: Diagnosis not present

## 2019-11-20 DIAGNOSIS — Z992 Dependence on renal dialysis: Secondary | ICD-10-CM | POA: Diagnosis not present

## 2019-11-20 DIAGNOSIS — D509 Iron deficiency anemia, unspecified: Secondary | ICD-10-CM | POA: Diagnosis not present

## 2019-11-20 DIAGNOSIS — N186 End stage renal disease: Secondary | ICD-10-CM | POA: Diagnosis not present

## 2019-11-20 DIAGNOSIS — D631 Anemia in chronic kidney disease: Secondary | ICD-10-CM | POA: Diagnosis not present

## 2019-11-21 DIAGNOSIS — D631 Anemia in chronic kidney disease: Secondary | ICD-10-CM | POA: Diagnosis not present

## 2019-11-21 DIAGNOSIS — D509 Iron deficiency anemia, unspecified: Secondary | ICD-10-CM | POA: Diagnosis not present

## 2019-11-21 DIAGNOSIS — Z992 Dependence on renal dialysis: Secondary | ICD-10-CM | POA: Diagnosis not present

## 2019-11-21 DIAGNOSIS — N186 End stage renal disease: Secondary | ICD-10-CM | POA: Diagnosis not present

## 2019-11-22 DIAGNOSIS — D631 Anemia in chronic kidney disease: Secondary | ICD-10-CM | POA: Diagnosis not present

## 2019-11-22 DIAGNOSIS — Z992 Dependence on renal dialysis: Secondary | ICD-10-CM | POA: Diagnosis not present

## 2019-11-22 DIAGNOSIS — N186 End stage renal disease: Secondary | ICD-10-CM | POA: Diagnosis not present

## 2019-11-22 DIAGNOSIS — D509 Iron deficiency anemia, unspecified: Secondary | ICD-10-CM | POA: Diagnosis not present

## 2019-11-23 DIAGNOSIS — N186 End stage renal disease: Secondary | ICD-10-CM | POA: Diagnosis not present

## 2019-11-23 DIAGNOSIS — D631 Anemia in chronic kidney disease: Secondary | ICD-10-CM | POA: Diagnosis not present

## 2019-11-23 DIAGNOSIS — D509 Iron deficiency anemia, unspecified: Secondary | ICD-10-CM | POA: Diagnosis not present

## 2019-11-23 DIAGNOSIS — Z992 Dependence on renal dialysis: Secondary | ICD-10-CM | POA: Diagnosis not present

## 2019-11-24 DIAGNOSIS — N186 End stage renal disease: Secondary | ICD-10-CM | POA: Diagnosis not present

## 2019-11-24 DIAGNOSIS — Z992 Dependence on renal dialysis: Secondary | ICD-10-CM | POA: Diagnosis not present

## 2019-11-24 DIAGNOSIS — D509 Iron deficiency anemia, unspecified: Secondary | ICD-10-CM | POA: Diagnosis not present

## 2019-11-24 DIAGNOSIS — D631 Anemia in chronic kidney disease: Secondary | ICD-10-CM | POA: Diagnosis not present

## 2019-11-25 DIAGNOSIS — D631 Anemia in chronic kidney disease: Secondary | ICD-10-CM | POA: Diagnosis not present

## 2019-11-25 DIAGNOSIS — D509 Iron deficiency anemia, unspecified: Secondary | ICD-10-CM | POA: Diagnosis not present

## 2019-11-25 DIAGNOSIS — N186 End stage renal disease: Secondary | ICD-10-CM | POA: Diagnosis not present

## 2019-11-25 DIAGNOSIS — Z992 Dependence on renal dialysis: Secondary | ICD-10-CM | POA: Diagnosis not present

## 2019-11-26 DIAGNOSIS — D631 Anemia in chronic kidney disease: Secondary | ICD-10-CM | POA: Diagnosis not present

## 2019-11-26 DIAGNOSIS — Z992 Dependence on renal dialysis: Secondary | ICD-10-CM | POA: Diagnosis not present

## 2019-11-26 DIAGNOSIS — D509 Iron deficiency anemia, unspecified: Secondary | ICD-10-CM | POA: Diagnosis not present

## 2019-11-26 DIAGNOSIS — N186 End stage renal disease: Secondary | ICD-10-CM | POA: Diagnosis not present

## 2019-11-27 DIAGNOSIS — N186 End stage renal disease: Secondary | ICD-10-CM | POA: Diagnosis not present

## 2019-11-27 DIAGNOSIS — D631 Anemia in chronic kidney disease: Secondary | ICD-10-CM | POA: Diagnosis not present

## 2019-11-27 DIAGNOSIS — Z992 Dependence on renal dialysis: Secondary | ICD-10-CM | POA: Diagnosis not present

## 2019-11-27 DIAGNOSIS — D509 Iron deficiency anemia, unspecified: Secondary | ICD-10-CM | POA: Diagnosis not present

## 2019-11-28 DIAGNOSIS — Z992 Dependence on renal dialysis: Secondary | ICD-10-CM | POA: Diagnosis not present

## 2019-11-28 DIAGNOSIS — D509 Iron deficiency anemia, unspecified: Secondary | ICD-10-CM | POA: Diagnosis not present

## 2019-11-28 DIAGNOSIS — D631 Anemia in chronic kidney disease: Secondary | ICD-10-CM | POA: Diagnosis not present

## 2019-11-28 DIAGNOSIS — N186 End stage renal disease: Secondary | ICD-10-CM | POA: Diagnosis not present

## 2019-11-29 DIAGNOSIS — D631 Anemia in chronic kidney disease: Secondary | ICD-10-CM | POA: Diagnosis not present

## 2019-11-29 DIAGNOSIS — Z992 Dependence on renal dialysis: Secondary | ICD-10-CM | POA: Diagnosis not present

## 2019-11-29 DIAGNOSIS — N186 End stage renal disease: Secondary | ICD-10-CM | POA: Diagnosis not present

## 2019-11-29 DIAGNOSIS — D509 Iron deficiency anemia, unspecified: Secondary | ICD-10-CM | POA: Diagnosis not present

## 2019-11-30 DIAGNOSIS — D509 Iron deficiency anemia, unspecified: Secondary | ICD-10-CM | POA: Diagnosis not present

## 2019-11-30 DIAGNOSIS — Z992 Dependence on renal dialysis: Secondary | ICD-10-CM | POA: Diagnosis not present

## 2019-11-30 DIAGNOSIS — N186 End stage renal disease: Secondary | ICD-10-CM | POA: Diagnosis not present

## 2019-11-30 DIAGNOSIS — D631 Anemia in chronic kidney disease: Secondary | ICD-10-CM | POA: Diagnosis not present

## 2019-12-01 DIAGNOSIS — N186 End stage renal disease: Secondary | ICD-10-CM | POA: Diagnosis not present

## 2019-12-01 DIAGNOSIS — D631 Anemia in chronic kidney disease: Secondary | ICD-10-CM | POA: Diagnosis not present

## 2019-12-01 DIAGNOSIS — Z992 Dependence on renal dialysis: Secondary | ICD-10-CM | POA: Diagnosis not present

## 2019-12-01 DIAGNOSIS — D509 Iron deficiency anemia, unspecified: Secondary | ICD-10-CM | POA: Diagnosis not present

## 2019-12-02 DIAGNOSIS — D631 Anemia in chronic kidney disease: Secondary | ICD-10-CM | POA: Diagnosis not present

## 2019-12-02 DIAGNOSIS — Z992 Dependence on renal dialysis: Secondary | ICD-10-CM | POA: Diagnosis not present

## 2019-12-02 DIAGNOSIS — N186 End stage renal disease: Secondary | ICD-10-CM | POA: Diagnosis not present

## 2019-12-02 DIAGNOSIS — D509 Iron deficiency anemia, unspecified: Secondary | ICD-10-CM | POA: Diagnosis not present

## 2019-12-03 DIAGNOSIS — Z992 Dependence on renal dialysis: Secondary | ICD-10-CM | POA: Diagnosis not present

## 2019-12-03 DIAGNOSIS — N186 End stage renal disease: Secondary | ICD-10-CM | POA: Diagnosis not present

## 2019-12-03 DIAGNOSIS — D509 Iron deficiency anemia, unspecified: Secondary | ICD-10-CM | POA: Diagnosis not present

## 2019-12-03 DIAGNOSIS — D631 Anemia in chronic kidney disease: Secondary | ICD-10-CM | POA: Diagnosis not present

## 2019-12-04 DIAGNOSIS — Z992 Dependence on renal dialysis: Secondary | ICD-10-CM | POA: Diagnosis not present

## 2019-12-04 DIAGNOSIS — D509 Iron deficiency anemia, unspecified: Secondary | ICD-10-CM | POA: Diagnosis not present

## 2019-12-04 DIAGNOSIS — N186 End stage renal disease: Secondary | ICD-10-CM | POA: Diagnosis not present

## 2019-12-04 DIAGNOSIS — D631 Anemia in chronic kidney disease: Secondary | ICD-10-CM | POA: Diagnosis not present

## 2019-12-05 DIAGNOSIS — N186 End stage renal disease: Secondary | ICD-10-CM | POA: Diagnosis not present

## 2019-12-05 DIAGNOSIS — D631 Anemia in chronic kidney disease: Secondary | ICD-10-CM | POA: Diagnosis not present

## 2019-12-05 DIAGNOSIS — Z992 Dependence on renal dialysis: Secondary | ICD-10-CM | POA: Diagnosis not present

## 2019-12-05 DIAGNOSIS — D509 Iron deficiency anemia, unspecified: Secondary | ICD-10-CM | POA: Diagnosis not present

## 2019-12-06 DIAGNOSIS — D509 Iron deficiency anemia, unspecified: Secondary | ICD-10-CM | POA: Diagnosis not present

## 2019-12-06 DIAGNOSIS — Z992 Dependence on renal dialysis: Secondary | ICD-10-CM | POA: Diagnosis not present

## 2019-12-06 DIAGNOSIS — N186 End stage renal disease: Secondary | ICD-10-CM | POA: Diagnosis not present

## 2019-12-06 DIAGNOSIS — D631 Anemia in chronic kidney disease: Secondary | ICD-10-CM | POA: Diagnosis not present

## 2019-12-07 DIAGNOSIS — D631 Anemia in chronic kidney disease: Secondary | ICD-10-CM | POA: Diagnosis not present

## 2019-12-07 DIAGNOSIS — D509 Iron deficiency anemia, unspecified: Secondary | ICD-10-CM | POA: Diagnosis not present

## 2019-12-07 DIAGNOSIS — N186 End stage renal disease: Secondary | ICD-10-CM | POA: Diagnosis not present

## 2019-12-07 DIAGNOSIS — Z992 Dependence on renal dialysis: Secondary | ICD-10-CM | POA: Diagnosis not present

## 2019-12-08 DIAGNOSIS — Z992 Dependence on renal dialysis: Secondary | ICD-10-CM | POA: Diagnosis not present

## 2019-12-08 DIAGNOSIS — D509 Iron deficiency anemia, unspecified: Secondary | ICD-10-CM | POA: Diagnosis not present

## 2019-12-08 DIAGNOSIS — N186 End stage renal disease: Secondary | ICD-10-CM | POA: Diagnosis not present

## 2019-12-08 DIAGNOSIS — D631 Anemia in chronic kidney disease: Secondary | ICD-10-CM | POA: Diagnosis not present

## 2019-12-09 DIAGNOSIS — N186 End stage renal disease: Secondary | ICD-10-CM | POA: Diagnosis not present

## 2019-12-09 DIAGNOSIS — D509 Iron deficiency anemia, unspecified: Secondary | ICD-10-CM | POA: Diagnosis not present

## 2019-12-09 DIAGNOSIS — Z992 Dependence on renal dialysis: Secondary | ICD-10-CM | POA: Diagnosis not present

## 2019-12-09 DIAGNOSIS — D631 Anemia in chronic kidney disease: Secondary | ICD-10-CM | POA: Diagnosis not present

## 2019-12-10 DIAGNOSIS — D631 Anemia in chronic kidney disease: Secondary | ICD-10-CM | POA: Diagnosis not present

## 2019-12-10 DIAGNOSIS — Z992 Dependence on renal dialysis: Secondary | ICD-10-CM | POA: Diagnosis not present

## 2019-12-10 DIAGNOSIS — N186 End stage renal disease: Secondary | ICD-10-CM | POA: Diagnosis not present

## 2019-12-10 DIAGNOSIS — D509 Iron deficiency anemia, unspecified: Secondary | ICD-10-CM | POA: Diagnosis not present

## 2019-12-11 DIAGNOSIS — Z992 Dependence on renal dialysis: Secondary | ICD-10-CM | POA: Diagnosis not present

## 2019-12-11 DIAGNOSIS — D509 Iron deficiency anemia, unspecified: Secondary | ICD-10-CM | POA: Diagnosis not present

## 2019-12-11 DIAGNOSIS — D631 Anemia in chronic kidney disease: Secondary | ICD-10-CM | POA: Diagnosis not present

## 2019-12-11 DIAGNOSIS — N186 End stage renal disease: Secondary | ICD-10-CM | POA: Diagnosis not present

## 2019-12-12 DIAGNOSIS — D509 Iron deficiency anemia, unspecified: Secondary | ICD-10-CM | POA: Diagnosis not present

## 2019-12-12 DIAGNOSIS — D631 Anemia in chronic kidney disease: Secondary | ICD-10-CM | POA: Diagnosis not present

## 2019-12-12 DIAGNOSIS — N186 End stage renal disease: Secondary | ICD-10-CM | POA: Diagnosis not present

## 2019-12-12 DIAGNOSIS — Z992 Dependence on renal dialysis: Secondary | ICD-10-CM | POA: Diagnosis not present

## 2019-12-13 DIAGNOSIS — Z992 Dependence on renal dialysis: Secondary | ICD-10-CM | POA: Diagnosis not present

## 2019-12-13 DIAGNOSIS — D631 Anemia in chronic kidney disease: Secondary | ICD-10-CM | POA: Diagnosis not present

## 2019-12-13 DIAGNOSIS — D509 Iron deficiency anemia, unspecified: Secondary | ICD-10-CM | POA: Diagnosis not present

## 2019-12-13 DIAGNOSIS — N186 End stage renal disease: Secondary | ICD-10-CM | POA: Diagnosis not present

## 2019-12-14 DIAGNOSIS — Z992 Dependence on renal dialysis: Secondary | ICD-10-CM | POA: Diagnosis not present

## 2019-12-14 DIAGNOSIS — D509 Iron deficiency anemia, unspecified: Secondary | ICD-10-CM | POA: Diagnosis not present

## 2019-12-14 DIAGNOSIS — N186 End stage renal disease: Secondary | ICD-10-CM | POA: Diagnosis not present

## 2019-12-14 DIAGNOSIS — D631 Anemia in chronic kidney disease: Secondary | ICD-10-CM | POA: Diagnosis not present

## 2019-12-15 DIAGNOSIS — N186 End stage renal disease: Secondary | ICD-10-CM | POA: Diagnosis not present

## 2019-12-15 DIAGNOSIS — D631 Anemia in chronic kidney disease: Secondary | ICD-10-CM | POA: Diagnosis not present

## 2019-12-15 DIAGNOSIS — Z992 Dependence on renal dialysis: Secondary | ICD-10-CM | POA: Diagnosis not present

## 2019-12-15 DIAGNOSIS — D509 Iron deficiency anemia, unspecified: Secondary | ICD-10-CM | POA: Diagnosis not present

## 2019-12-16 DIAGNOSIS — Z992 Dependence on renal dialysis: Secondary | ICD-10-CM | POA: Diagnosis not present

## 2019-12-26 ENCOUNTER — Inpatient Hospital Stay (HOSPITAL_COMMUNITY): Admission: RE | Admit: 2019-12-26 | Payer: Medicare Other | Source: Ambulatory Visit

## 2019-12-27 ENCOUNTER — Other Ambulatory Visit (HOSPITAL_COMMUNITY)
Admission: RE | Admit: 2019-12-27 | Discharge: 2019-12-27 | Disposition: A | Discharge status: Home / Self Care | Payer: Medicare Other | Source: Ambulatory Visit | Attending: Emergency Medicine | Admitting: Emergency Medicine

## 2019-12-27 DIAGNOSIS — Z20822 Contact with and (suspected) exposure to covid-19: Secondary | ICD-10-CM | POA: Diagnosis not present

## 2019-12-27 DIAGNOSIS — Z01812 Encounter for preprocedural laboratory examination: Secondary | ICD-10-CM | POA: Diagnosis not present

## 2019-12-27 LAB — SARS CORONAVIRUS 2 (TAT 6-24 HRS): SARS Coronavirus 2: NEGATIVE

## 2019-12-29 ENCOUNTER — Ambulatory Visit (INDEPENDENT_AMBULATORY_CARE_PROVIDER_SITE_OTHER): Payer: Medicare Other | Admitting: Emergency Medicine

## 2019-12-29 ENCOUNTER — Other Ambulatory Visit: Payer: Self-pay

## 2019-12-29 DIAGNOSIS — J441 Chronic obstructive pulmonary disease with (acute) exacerbation: Secondary | ICD-10-CM

## 2019-12-29 LAB — PULMONARY FUNCTION TEST
DL/VA % pred: 119 %
DL/VA: 4.95 ml/min/mmHg/L
DLCO cor % pred: 81 %
DLCO cor: 18.03 ml/min/mmHg
DLCO unc % pred: 81 %
DLCO unc: 18.03 ml/min/mmHg
FEF 25-75 Post: 0.56 L/sec
FEF 25-75 Pre: 1.01 L/sec
FEF2575-%Change-Post: -44 %
FEF2575-%Pred-Post: 26 %
FEF2575-%Pred-Pre: 48 %
FEV1-%Change-Post: -9 %
FEV1-%Pred-Post: 52 %
FEV1-%Pred-Pre: 57 %
FEV1-Post: 1.23 L
FEV1-Pre: 1.36 L
FEV1FVC-%Change-Post: 12 %
FEV1FVC-%Pred-Pre: 97 %
FEV6-%Change-Post: -19 %
FEV6-%Pred-Post: 49 %
FEV6-%Pred-Pre: 61 %
FEV6-Post: 1.46 L
FEV6-Pre: 1.82 L
FEV6FVC-%Pred-Post: 105 %
FEV6FVC-%Pred-Pre: 105 %
FVC-%Change-Post: -19 %
FVC-%Pred-Post: 46 %
FVC-%Pred-Pre: 58 %
FVC-Post: 1.46 L
FVC-Pre: 1.82 L
Post FEV1/FVC ratio: 84 %
Post FEV6/FVC ratio: 100 %
Pre FEV1/FVC ratio: 74 %
Pre FEV6/FVC Ratio: 100 %
RV % pred: 120 %
RV: 2.6 L
TLC % pred: 82 %
TLC: 4.97 L

## 2019-12-29 NOTE — Progress Notes (Signed)
Full PFT performed today. °

## 2020-01-05 ENCOUNTER — Encounter: Payer: Self-pay | Admitting: Emergency Medicine

## 2020-01-05 ENCOUNTER — Ambulatory Visit (INDEPENDENT_AMBULATORY_CARE_PROVIDER_SITE_OTHER): Payer: Medicare Other | Admitting: Emergency Medicine

## 2020-01-05 ENCOUNTER — Other Ambulatory Visit: Payer: Self-pay

## 2020-01-05 DIAGNOSIS — I2510 Atherosclerotic heart disease of native coronary artery without angina pectoris: Secondary | ICD-10-CM

## 2020-01-05 DIAGNOSIS — J4540 Moderate persistent asthma, uncomplicated: Secondary | ICD-10-CM

## 2020-01-05 DIAGNOSIS — G4733 Obstructive sleep apnea (adult) (pediatric): Secondary | ICD-10-CM

## 2020-01-05 DIAGNOSIS — Z9989 Dependence on other enabling machines and devices: Secondary | ICD-10-CM

## 2020-01-05 MED ORDER — STIOLTO RESPIMAT 2.5-2.5 MCG/ACT IN AERS: 2.0000 | INHALATION_SPRAY | Freq: Every day | RESPIRATORY_TRACT | 0 refills | Status: DC

## 2020-01-05 NOTE — Assessment & Plan Note (Signed)
Asthma with COPD.  Confirmed on his pulmonary function testing from 6/14.  He never really tried Stiolto on a schedule.  Has been using as needed.  That is how he was using the Symbicort as well.  I talked to him today about the benefits of a daily bronchodilator given his obstruction on PFT.  He is going to give it a better try and let us know if he benefits.  If so we will continue it going forward  [employer].  Start taking 2 puffs once a day every day on a schedule.  Keep track of whether your breathing benefits from this.  Call our office to let us know how you have done and whether you want a prescription for this called to your pharmacy.  If it helps you then we should continue it every day. Keep albuterol available to use 2 puffs if you need it for shortness of breath, chest tightness, wheezing. COVID-19 vaccine up-to-date. Follow with Dr Lamonte Sakai in 3 months or sooner if you have any problems

## 2020-01-05 NOTE — Assessment & Plan Note (Signed)
With his CPAP.  I talked to him today about the benefits of getting back on this.  He is going to try.  You would benefit from getting back on your CPAP machine every night.  Work hard on trying to do this.

## 2020-01-05 NOTE — Progress Notes (Signed)
Subjective:    Patient ID: [name] [name], male    DOB: 1951/07/15, 69 y.o.   MRN: [id number]  HPI 69 year old obese former smoker (25 pack years), history of CAD, CKD 5 on PD (from HD about 1 yr ago), diabetes, hypertension, GERD with a hiatal hernia, OSA (poor compliance w CPAP).  He carries a history of asthma that was made following chemical exposure on 9/11. Has been managed with Symbicort, albuterol nebs which he uses about 3-4x a month.  He was started on O2 w exertion over 10 yrs ago, wears it prn.   Has nasal pillows, only wear his mask rarely - hasn't used in a month. Irritates his face. Reports that his day-to-day breathing remains very difficult. He hears wheeze at night when supine, also w exertion. He is able to walk 150 ft then has to rest. Difficult to shop. Has cough every morning - yellow thick mucous, never hemoptysis.   Unsure when his last PFT have been done.  CT abdomen 09/24/19 reviewed by me, shows that the lung bases are clear without any pleural effusion.  ROV 01/05/20 --follow-up visit for 69 year old gentleman on peritoneal dialysis, with diabetes, hypertension, GERD, hiatal hernia, OSA and COPD/asthma.  At his last visit I did trial changing Symbicort to Stiolto to see if he would get more benefit.  He reports that his breathing better - started using the stiolto but was only taking prn, not every day. Hardly ever needs albuterol. He has not retried his CPAP - he has used many different masks, has nasal pillows.  He has been having some relative hypotension, recently stopped his hydralazine.  Was decreased to 5 mg daily.  Remains on peritoneal dialysis Underwent pulmonary function testing/14/21 which I have reviewed, show mixed obstruction and restriction with an FEV1 of 3 6 L (57% predicted), normal (pseudonormal) lung volumes, normal diffusion capacity.  MDM: Pulmonary function testing from 6/14 Cardiology notes Dr. Debara Pickett 11/14/2019    Review of Systems    Constitutional: Negative for fever and unexpected weight change.  HENT: Positive for congestion and sneezing. Negative for dental problem, ear pain, nosebleeds, postnasal drip, rhinorrhea, sinus pressure, sore throat and trouble swallowing.   Eyes: Negative for redness and itching.  Respiratory: Positive for cough and shortness of breath. Negative for chest tightness and wheezing.   Cardiovascular: Negative for palpitations and leg swelling.  Gastrointestinal: Negative for nausea and vomiting.  Genitourinary: Negative for dysuria.  Musculoskeletal: Negative for joint swelling.  Skin: Negative for rash.  Allergic/Immunologic: Negative.  Negative for environmental allergies, food allergies and immunocompromised state.  Neurological: Negative for headaches.  Hematological: Does not bruise/bleed easily.  Psychiatric/Behavioral: Negative for dysphoric mood. The patient is not nervous/anxious.     Past Medical History:  Diagnosis Date   Anemia    Arthritis    Asthma    Bell palsy    CAD (coronary artery disease)    a. 2014 MV: abnl w/ infap ischemia; b. 03/2013 Cath: aneurysmal bleb in the LAD w/ otw nonobs dzs-->Med Rx.   Chronic back pain    Chronic knee pain    a. 09/2015 s/p R TKA.   Chronic pain    Chronic shoulder pain    Chronic sinusitis    CKD (chronic kidney disease), stage IV (HCC)    stage 4 per office visit note of Dr Lowanda Foster on 05/2015    COPD (chronic obstructive pulmonary disease) ([place])    Diabetes mellitus without complication ([place])    type  II    Essential hypertension    GERD (gastroesophageal reflux disease)    Gout    Gout    Hepatomegaly    noted on noncontrast CT 2015   History of hiatal hernia    Hyperlipidemia    Lateral meniscus tear    Obesity    Truncal   Obstructive sleep apnea    does not use cpap    On home oxygen therapy    uses 2l when is going somewhere per patient    PUD (peptic ulcer disease)    remote, reports  f/u EGD about 8 years ago unremarkable    Reactive airway disease    related to exposure to chemical during 9/11   Renal insufficiency    Sinusitis    Vitamin D deficiency      Family History  Problem Relation Age of Onset   Hypertension Mother        MI   Cancer Mother        breast    Diabetes Mother    Diabetes Father    Hypertension Father    Hypertension Sister    Diabetes Sister    Arthritis Other    Asthma Other    Lung disease Other    Anesthesia problems Neg Hx    Hypotension Neg Hx    Malignant hyperthermia Neg Hx    Pseudochol deficiency Neg Hx    Colon cancer Neg Hx      Social History   Socioeconomic History   Marital status: Married    Spouse name: Not on file   Number of children: 2   Years of education: 12th grade   Highest education level: Not on file  Occupational History   Occupation: disabled   Occupation:      Fish farm manager: UNEMPLOYED  Tobacco Use   Smoking status: Former Smoker    Packs/day: 1.00    Years: 25.00    Pack years: 25.00    Types: Cigarettes    Quit date: 03/27/2010    Years since quitting: 9.7   Smokeless tobacco: Never Used   Tobacco comment: Quit x 7 years  Vaping Use   Vaping Use: Never used  Substance and Sexual Activity   Alcohol use: Yes    Alcohol/week: 0.0 standard drinks    Comment: occasionally   Drug use: Not Currently    Types: Marijuana    Comment: cocaine- last time used- 11/24/2017 , marijuana-    Sexual activity: Not on file  Other Topics Concern   Not on file  Social History Narrative   He quit smoking in 2010. 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 He developed pulmonary problems, became disabled because of lower airway disease in 2009.       WATCHES BASKETBALL. HIS TEAM IS [employer].   Social Determinants of Health   Financial Resource Strain:    Difficulty of Paying Living Expenses:   Food Insecurity:    Worried About  Charity fundraiser in the Last Year:    Arboriculturist in the Last Year:   Transportation Needs:    Film/video editor (Medical):    Lack of Transportation (Non-Medical):   Physical Activity:    Days of Exercise per Week:    Minutes of Exercise per Session:   Stress:    Feeling of Stress :   Social Connections:    Frequency of Communication with Friends and Family:  Frequency of Social Gatherings with Friends and Family:    Attends Religious Services:    Active Member of Clubs or Organizations:    Attends Archivist Meetings:    Marital Status:   Intimate Partner Violence:    Fear of Current or Ex-Partner:    Emotionally Abused:    Physically Abused:    Sexually Abused:      Allergies  Allergen Reactions   Tramadol Hcl    Opana [Oxymorphone Hcl] Itching   Oxymorphone Itching   Tramadol Itching     Outpatient Medications Prior to Visit  Medication Sig Dispense Refill   allopurinol (ZYLOPRIM) 100 MG tablet Take 200 mg by mouth daily.      amLODipine (NORVASC) 5 MG tablet Take 1 tablet (5 mg total) by mouth daily. 90 tablet 3   aspirin EC 81 MG tablet Take 1 tablet (81 mg total) by mouth daily. 90 tablet 3   calcitRIOL (ROCALTROL) 0.25 MCG capsule Take 0.25 mcg by mouth daily.     cetirizine (ZYRTEC) 10 MG tablet Take 10 mg by mouth at bedtime as needed for allergies.      DROPLET PEN NEEDLES 31G X 5 MM MISC INJECT SUBCUTANEOUSLY TWICE DAILY AS DIRECTED 200 each 2   fluticasone (FLONASE) 50 MCG/ACT nasal spray Place 2 sprays into both nostrils daily. 16 g 0   folic acid (FOLVITE) 1 MG tablet Take 1 tablet by mouth daily.     insulin glargine (LANTUS SOLOSTAR) 100 UNIT/ML Solostar Pen Inject 20 Units into the skin at bedtime. INJECT  20 UNITS SUBCUTANEOUSLY AT BEDTIME 30 mL 0   losartan (COZAAR) 100 MG tablet      metoprolol succinate (TOPROL-XL) 50 MG 24 hr tablet      mometasone (NASONEX) 50 MCG/ACT nasal spray Place 2  sprays into the nose 2 (two) times daily as needed (allergies).      NOVOLOG FLEXPEN 100 UNIT/ML FlexPen      pantoprazole (PROTONIX) 40 MG tablet TAKE 1 TABLET TWICE DAILY 30 MINUTES PRIOR TO MEALS 180 tablet 3   simvastatin (ZOCOR) 20 MG tablet Take 20 mg by mouth every morning.      Tiotropium Bromide-Olodaterol (STIOLTO RESPIMAT) 2.5-2.5 MCG/ACT AERS Inhale 2 puffs into the lungs daily. 4 g 0   TRADJENTA 5 MG TABS tablet TAKE 1 TABLET EVERY DAY 90 tablet 0   No facility-administered medications prior to visit.        Objective:   Physical Exam Vitals:   01/05/20 1032  BP: 132/80  Pulse: 90  Temp: 98.4 F (36.9 C)  TempSrc: Oral  SpO2: 96%  Weight: 238 lb 6.4 oz (108.1 kg)  Height: 5\' 4"  (1.626 m)    Gen: Pleasant, obese man, in no distress,  normal affect  ENT: No lesions,  mouth clear,  oropharynx clear, mild erythema, no postnasal drip  Neck: No JVD, no stridor on a normal breath, no upper airway noise  Lungs: No use of accessory muscles, no crackles or wheezing on normal respiration, no wheeze on forced expiration  Cardiovascular: RRR, heart sounds normal, no murmur or gallops, no peripheral edema  Musculoskeletal: No deformities, no cyanosis or clubbing  Neuro: alert, awake, non focal  Skin: Warm, no lesions or rash      Assessment & Plan:  Asthma Asthma with COPD.  Confirmed on his pulmonary function testing from 6/14.  He never really tried Stiolto on a schedule.  Has been using as needed.  That is how he  was using the Symbicort as well.  I talked to him today about the benefits of a daily bronchodilator given his obstruction on PFT.  He is going to give it a better try and let us know if he benefits.  If so we will continue it going forward  [employer].  Start taking 2 puffs once a day every day on a schedule.  Keep track of whether your breathing benefits from this.  Call our office to let us know how you have done and whether you want a  prescription for this called to your pharmacy.  If it helps you then we should continue it every day. Keep albuterol available to use 2 puffs if you need it for shortness of breath, chest tightness, wheezing. COVID-19 vaccine up-to-date. Follow with Dr Lamonte Sakai in 3 months or sooner if you have any problems  OSA on CPAP With his CPAP.  I talked to him today about the benefits of getting back on this.  He is going to try.  You would benefit from getting back on your CPAP machine every night.  Work hard on trying to do this.   Baltazar Apo, MD, PhD 01/05/2020, 11:00 AM [facility] [phone] or if no answer [phone]

## 2020-01-05 NOTE — Addendum Note (Signed)
Addended by: Edythe Clarity on: 01/05/2020 11:07 AM   Modules accepted: Orders

## 2020-01-05 NOTE — Patient Instructions (Signed)
Retry Stiolto.  Start taking 2 puffs once a day every day on a schedule.  Keep track of whether your breathing benefits from this.  Call our office to let us know how you have done and whether you want a prescription for this called to your pharmacy.  If it helps you then we should continue it every day. Keep albuterol available to use 2 puffs if you need it for shortness of breath, chest tightness, wheezing. Continue your dialysis and blood pressure medications as managed by nephrology and cardiology. COVID-19 vaccine up-to-date. You would benefit from getting back on your CPAP machine every night.  Work hard on trying to do this. Follow with Dr Lamonte Sakai in 3 months or sooner if you have any problems.

## 2020-01-06 ENCOUNTER — Other Ambulatory Visit: Payer: Self-pay | Admitting: "Endocrinology

## 2020-01-15 DIAGNOSIS — N186 End stage renal disease: Secondary | ICD-10-CM | POA: Diagnosis not present

## 2020-01-15 DIAGNOSIS — D509 Iron deficiency anemia, unspecified: Secondary | ICD-10-CM | POA: Diagnosis not present

## 2020-01-15 DIAGNOSIS — D631 Anemia in chronic kidney disease: Secondary | ICD-10-CM | POA: Diagnosis not present

## 2020-01-15 DIAGNOSIS — Z992 Dependence on renal dialysis: Secondary | ICD-10-CM | POA: Diagnosis not present

## 2020-01-15 DIAGNOSIS — N2581 Secondary hyperparathyroidism of renal origin: Secondary | ICD-10-CM | POA: Diagnosis not present

## 2020-01-16 DIAGNOSIS — N2581 Secondary hyperparathyroidism of renal origin: Secondary | ICD-10-CM | POA: Diagnosis not present

## 2020-01-16 DIAGNOSIS — D631 Anemia in chronic kidney disease: Secondary | ICD-10-CM | POA: Diagnosis not present

## 2020-01-16 DIAGNOSIS — Z992 Dependence on renal dialysis: Secondary | ICD-10-CM | POA: Diagnosis not present

## 2020-01-16 DIAGNOSIS — N186 End stage renal disease: Secondary | ICD-10-CM | POA: Diagnosis not present

## 2020-01-16 DIAGNOSIS — D509 Iron deficiency anemia, unspecified: Secondary | ICD-10-CM | POA: Diagnosis not present

## 2020-01-17 DIAGNOSIS — N2581 Secondary hyperparathyroidism of renal origin: Secondary | ICD-10-CM | POA: Diagnosis not present

## 2020-01-17 DIAGNOSIS — N186 End stage renal disease: Secondary | ICD-10-CM | POA: Diagnosis not present

## 2020-01-17 DIAGNOSIS — Z992 Dependence on renal dialysis: Secondary | ICD-10-CM | POA: Diagnosis not present

## 2020-01-17 DIAGNOSIS — D509 Iron deficiency anemia, unspecified: Secondary | ICD-10-CM | POA: Diagnosis not present

## 2020-01-17 DIAGNOSIS — D631 Anemia in chronic kidney disease: Secondary | ICD-10-CM | POA: Diagnosis not present

## 2020-01-18 DIAGNOSIS — N186 End stage renal disease: Secondary | ICD-10-CM | POA: Diagnosis not present

## 2020-01-18 DIAGNOSIS — Z992 Dependence on renal dialysis: Secondary | ICD-10-CM | POA: Diagnosis not present

## 2020-01-18 DIAGNOSIS — D631 Anemia in chronic kidney disease: Secondary | ICD-10-CM | POA: Diagnosis not present

## 2020-01-18 DIAGNOSIS — N2581 Secondary hyperparathyroidism of renal origin: Secondary | ICD-10-CM | POA: Diagnosis not present

## 2020-01-18 DIAGNOSIS — D509 Iron deficiency anemia, unspecified: Secondary | ICD-10-CM | POA: Diagnosis not present

## 2020-01-19 DIAGNOSIS — N2581 Secondary hyperparathyroidism of renal origin: Secondary | ICD-10-CM | POA: Diagnosis not present

## 2020-01-19 DIAGNOSIS — D631 Anemia in chronic kidney disease: Secondary | ICD-10-CM | POA: Diagnosis not present

## 2020-01-19 DIAGNOSIS — N186 End stage renal disease: Secondary | ICD-10-CM | POA: Diagnosis not present

## 2020-01-19 DIAGNOSIS — Z992 Dependence on renal dialysis: Secondary | ICD-10-CM | POA: Diagnosis not present

## 2020-01-19 DIAGNOSIS — D509 Iron deficiency anemia, unspecified: Secondary | ICD-10-CM | POA: Diagnosis not present

## 2020-01-20 DIAGNOSIS — Z992 Dependence on renal dialysis: Secondary | ICD-10-CM | POA: Diagnosis not present

## 2020-01-20 DIAGNOSIS — N2581 Secondary hyperparathyroidism of renal origin: Secondary | ICD-10-CM | POA: Diagnosis not present

## 2020-01-20 DIAGNOSIS — N186 End stage renal disease: Secondary | ICD-10-CM | POA: Diagnosis not present

## 2020-01-20 DIAGNOSIS — D631 Anemia in chronic kidney disease: Secondary | ICD-10-CM | POA: Diagnosis not present

## 2020-01-20 DIAGNOSIS — D509 Iron deficiency anemia, unspecified: Secondary | ICD-10-CM | POA: Diagnosis not present

## 2020-01-21 DIAGNOSIS — N186 End stage renal disease: Secondary | ICD-10-CM | POA: Diagnosis not present

## 2020-01-21 DIAGNOSIS — D631 Anemia in chronic kidney disease: Secondary | ICD-10-CM | POA: Diagnosis not present

## 2020-01-21 DIAGNOSIS — N2581 Secondary hyperparathyroidism of renal origin: Secondary | ICD-10-CM | POA: Diagnosis not present

## 2020-01-21 DIAGNOSIS — Z992 Dependence on renal dialysis: Secondary | ICD-10-CM | POA: Diagnosis not present

## 2020-01-21 DIAGNOSIS — D509 Iron deficiency anemia, unspecified: Secondary | ICD-10-CM | POA: Diagnosis not present

## 2020-01-22 DIAGNOSIS — Z992 Dependence on renal dialysis: Secondary | ICD-10-CM | POA: Diagnosis not present

## 2020-01-22 DIAGNOSIS — D631 Anemia in chronic kidney disease: Secondary | ICD-10-CM | POA: Diagnosis not present

## 2020-01-22 DIAGNOSIS — D509 Iron deficiency anemia, unspecified: Secondary | ICD-10-CM | POA: Diagnosis not present

## 2020-01-22 DIAGNOSIS — N2581 Secondary hyperparathyroidism of renal origin: Secondary | ICD-10-CM | POA: Diagnosis not present

## 2020-01-22 DIAGNOSIS — N186 End stage renal disease: Secondary | ICD-10-CM | POA: Diagnosis not present

## 2020-01-23 DIAGNOSIS — Z992 Dependence on renal dialysis: Secondary | ICD-10-CM | POA: Diagnosis not present

## 2020-01-23 DIAGNOSIS — N186 End stage renal disease: Secondary | ICD-10-CM | POA: Diagnosis not present

## 2020-01-23 DIAGNOSIS — D509 Iron deficiency anemia, unspecified: Secondary | ICD-10-CM | POA: Diagnosis not present

## 2020-01-23 DIAGNOSIS — D631 Anemia in chronic kidney disease: Secondary | ICD-10-CM | POA: Diagnosis not present

## 2020-01-23 DIAGNOSIS — N2581 Secondary hyperparathyroidism of renal origin: Secondary | ICD-10-CM | POA: Diagnosis not present

## 2020-01-24 DIAGNOSIS — Z992 Dependence on renal dialysis: Secondary | ICD-10-CM | POA: Diagnosis not present

## 2020-01-24 DIAGNOSIS — N2581 Secondary hyperparathyroidism of renal origin: Secondary | ICD-10-CM | POA: Diagnosis not present

## 2020-01-24 DIAGNOSIS — N186 End stage renal disease: Secondary | ICD-10-CM | POA: Diagnosis not present

## 2020-01-24 DIAGNOSIS — D509 Iron deficiency anemia, unspecified: Secondary | ICD-10-CM | POA: Diagnosis not present

## 2020-01-24 DIAGNOSIS — D631 Anemia in chronic kidney disease: Secondary | ICD-10-CM | POA: Diagnosis not present

## 2020-01-25 DIAGNOSIS — D509 Iron deficiency anemia, unspecified: Secondary | ICD-10-CM | POA: Diagnosis not present

## 2020-01-25 DIAGNOSIS — N2581 Secondary hyperparathyroidism of renal origin: Secondary | ICD-10-CM | POA: Diagnosis not present

## 2020-01-25 DIAGNOSIS — Z992 Dependence on renal dialysis: Secondary | ICD-10-CM | POA: Diagnosis not present

## 2020-01-25 DIAGNOSIS — D631 Anemia in chronic kidney disease: Secondary | ICD-10-CM | POA: Diagnosis not present

## 2020-01-25 DIAGNOSIS — N186 End stage renal disease: Secondary | ICD-10-CM | POA: Diagnosis not present

## 2020-01-26 DIAGNOSIS — D509 Iron deficiency anemia, unspecified: Secondary | ICD-10-CM | POA: Diagnosis not present

## 2020-01-26 DIAGNOSIS — Z992 Dependence on renal dialysis: Secondary | ICD-10-CM | POA: Diagnosis not present

## 2020-01-26 DIAGNOSIS — N186 End stage renal disease: Secondary | ICD-10-CM | POA: Diagnosis not present

## 2020-01-26 DIAGNOSIS — D631 Anemia in chronic kidney disease: Secondary | ICD-10-CM | POA: Diagnosis not present

## 2020-01-26 DIAGNOSIS — N2581 Secondary hyperparathyroidism of renal origin: Secondary | ICD-10-CM | POA: Diagnosis not present

## 2020-01-27 DIAGNOSIS — D509 Iron deficiency anemia, unspecified: Secondary | ICD-10-CM | POA: Diagnosis not present

## 2020-01-27 DIAGNOSIS — Z992 Dependence on renal dialysis: Secondary | ICD-10-CM | POA: Diagnosis not present

## 2020-01-27 DIAGNOSIS — N2581 Secondary hyperparathyroidism of renal origin: Secondary | ICD-10-CM | POA: Diagnosis not present

## 2020-01-27 DIAGNOSIS — N186 End stage renal disease: Secondary | ICD-10-CM | POA: Diagnosis not present

## 2020-01-27 DIAGNOSIS — D631 Anemia in chronic kidney disease: Secondary | ICD-10-CM | POA: Diagnosis not present

## 2020-01-28 ENCOUNTER — Ambulatory Visit (INDEPENDENT_AMBULATORY_CARE_PROVIDER_SITE_OTHER): Payer: Medicare Other | Admitting: Internal Medicine

## 2020-01-28 ENCOUNTER — Other Ambulatory Visit: Payer: Self-pay

## 2020-01-28 ENCOUNTER — Encounter: Payer: Self-pay | Admitting: Internal Medicine

## 2020-01-28 VITALS — BP 120/70 | HR 81 | Ht 64.0 in | Wt 236.4 lb

## 2020-01-28 DIAGNOSIS — G4733 Obstructive sleep apnea (adult) (pediatric): Secondary | ICD-10-CM | POA: Diagnosis not present

## 2020-01-28 DIAGNOSIS — Z992 Dependence on renal dialysis: Secondary | ICD-10-CM | POA: Diagnosis not present

## 2020-01-28 DIAGNOSIS — N186 End stage renal disease: Secondary | ICD-10-CM | POA: Diagnosis not present

## 2020-01-28 DIAGNOSIS — E782 Mixed hyperlipidemia: Secondary | ICD-10-CM

## 2020-01-28 DIAGNOSIS — D631 Anemia in chronic kidney disease: Secondary | ICD-10-CM | POA: Diagnosis not present

## 2020-01-28 DIAGNOSIS — N2581 Secondary hyperparathyroidism of renal origin: Secondary | ICD-10-CM | POA: Diagnosis not present

## 2020-01-28 DIAGNOSIS — I2510 Atherosclerotic heart disease of native coronary artery without angina pectoris: Secondary | ICD-10-CM

## 2020-01-28 DIAGNOSIS — D509 Iron deficiency anemia, unspecified: Secondary | ICD-10-CM | POA: Diagnosis not present

## 2020-01-28 DIAGNOSIS — Z9989 Dependence on other enabling machines and devices: Secondary | ICD-10-CM

## 2020-01-28 NOTE — Progress Notes (Signed)
OFFICE NOTE  Chief Complaint:  No complaint  Primary Care Physician: [name], MD  HPI:  [name] [name] Is a pleasant 69 year old male with a history of insulin-dependent diabetes, dyslipidemia, and hypertension. He's been a diabetic since 1999. He's been on insulin for only 3-4 years. He was a former patient of Dr. Rollene Fare who ordered a stress test in 2014. This demonstrated reversible inferoapical ischemia and was read as moderate risk. He ultimately underwent heart catheterization by Dr. Ellyn Hack on 03/28/2013 which demonstrated a small, possibly minimal aneurysmal bleb in the LAD but no significant disease. There is a dominant RCA with no disease. Essentially there was no significant obstructive coronary disease. At the time his EKG did show inferior T-wave abnormalities and very small R waves but no evidence for Q waves. Today he is referred back for abnormalities EKG which directly compared to his old EKG shows no significant change. There is an IVCD and very small R waves inferiorly, which the computer may have this read as inferior infarct. I do not see any significant change compared to his previous studies. He denies any new or worsening chest pain or shortness of breath.  [name] [name] returns today for follow-up. Overall he is feeling fairly well. He denies any chest pain or shortness of breath. Blood pressure is slightly elevated to 162/94, however recheck was 140/84. Please not been able to lose much weight. He is on chronic Plavix therapy due to what was thought to be plaque rupture but did not require stent by catheterization in 2014. This was a preferred agent over aspirin and he's remained on that, but certainly could come off of that if he were to undergo any procedures he is contemplating a penile implant for erectile dysfunction.  05/01/2016  [name] [name] was seen today in follow-up. He recently saw [name], NP, in April 2016 for follow-up. Blood pressure was  somewhat elevated at the time. It was not is ambulatory due to problems with his knee. There was a discussion about stopping Plavix and keeping him on aspirin however he remained on Plavix. Unfortunately he has stage IV chronic kidney disease and there is discussion about placement of a fistula. He scheduled for upper extremity Dopplers at the end of this month and follow-up with Dr. Oneida Alar. He had a number of questions today regarding dialysis and his fistula, but I reassured him based on his creatinine that he should consider this even if his numbers seem to have stabilized. He denies any chest pain or worsening shortness of breath. He did get new CPAP equipment but reports intermittent compliance with it. He still has some daytime fatigue.  10/27/2016  [name] [name] was seen today in follow-up. He underwent recent placement of a fistula left upper arm. This has a positive thrill today. Blood pressure is elevated somewhat 152/88. Recently his creatinine was increased up to 5.08 which is progression from 3.74 in December. I suspect he will have to start on dialysis soon. Otherwise hemoglobin A1c is stable around 7.0. He is cholesterol also has looked reasonably well controlled with total cholesterol 146, HDL-C 36, LDL-C 72 and triglycerides 190.  11/13/2017  [name] [name] was seen today for routine follow-up.  Overall he is doing very well.  He denies any chest pain or worsening shortness of breath.  His creatinine remains around 5.7.  This is been fairly stable and he is managed to remain off dialysis.  He does have left upper extremity fistula.  Hemoglobin A1c is  well-controlled at 6.2 and is followed by Dr. Dorris Fetch in [place].  His total cholesterol was 110, HDL 38, LDL 54 triglycerides 101, demonstrating excellent control on simvastatin.  Blood pressure is at goal today 136/78.  11/14/2019  [name] [name] is seen today in follow-up.  He continues on home peritoneal dialysis.  Blood pressure was noted to be  very low today at 86/56.  He denies any fevers or chills or other signs or symptoms of infection.  His dialysis center had recommended stopping his hydralazine.  Despite this his blood pressure remains low.  He is also on amlodipine, metoprolol and losartan.  He reports some fatigue but denies chest pain or shortness of breath.  01/28/2020  [name] [name] is seen today in follow-up.  Overall he continues to do well.  He is on PD which he is now doing at night.  He is overdue for a lipid profile which we will order.  He says he stopped Plavix and aspirin.  He has multiple indications for this including diabetes and prior history of coronary disease.  He denies any chest pain or shortness of breath.  Blood pressures well controlled today 120/70 and at times at home can be as low as the 26V systolic but he is asymptomatic with this.  PMHx:  Past Medical History:  Diagnosis Date  . Anemia   . Arthritis   . Asthma   . Bell palsy   . CAD (coronary artery disease)    a. 2014 MV: abnl w/ infap ischemia; b. 03/2013 Cath: aneurysmal bleb in the LAD w/ otw nonobs dzs-->Med Rx.  . Chronic back pain   . Chronic knee pain    a. 09/2015 s/p R TKA.  Marland Kitchen Chronic pain   . Chronic shoulder pain   . Chronic sinusitis   . CKD (chronic kidney disease), stage IV ([place])    stage 4 per office visit note of Dr Lowanda Foster on 05/2015   . COPD (chronic obstructive pulmonary disease) ([place])   . Diabetes mellitus without complication ([place])    type II   . Essential hypertension   . GERD (gastroesophageal reflux disease)   . Gout   . Gout   . Hepatomegaly    noted on noncontrast CT 2015  . History of hiatal hernia   . Hyperlipidemia   . Lateral meniscus tear   . Obesity    [place]  . Obstructive sleep apnea    does not use cpap   . On home oxygen therapy    uses 2l when is going somewhere per patient   . PUD (peptic ulcer disease)    remote, reports f/u EGD about 8 years ago unremarkable   . Reactive airway disease      related to exposure to chemical during 9/11  . Renal insufficiency   . Sinusitis   . Vitamin D deficiency     Past Surgical History:  Procedure Laterality Date  . ASAD LT SHOULDER  12/2008   left shoulder  . AV FISTULA PLACEMENT Left 08/09/2016   Procedure: BRACHIOCEPHALIC ARTERIOVENOUS (AV) FISTULA CREATION LEFT ARM;  Surgeon: [name], MD;  Location: [facility];  Service: Vascular;  Laterality: Left;  . CAPD INSERTION N/A 10/07/2018   Procedure: LAPAROSCOPIC PERITONEAL CATHETER PLACEMENT;  Surgeon: Kieth Brightly Arta Bruce, MD;  Location: [facility];  Service: General;  Laterality: N/A;  . CATARACT EXTRACTION W/PHACO Left 03/28/2016   Procedure: CATARACT EXTRACTION PHACO AND INTRAOCULAR LENS PLACEMENT LEFT EYE;  Surgeon: [name], MD;  Location: [facility];  Service: Ophthalmology;  Laterality: Left;  CDE: 4.77  . CATARACT EXTRACTION W/PHACO Right 04/11/2016   Procedure: CATARACT EXTRACTION PHACO AND INTRAOCULAR LENS PLACEMENT RIGHT EYE; CDE:  4.74;  Surgeon: [name], MD;  Location: [facility];  Service: Ophthalmology;  Laterality: Right;  . COLONOSCOPY  10/2008   Fields: Rectal polyp obliterated, not retrieved, hemorrhoids, single ascending colon diverticulum near the CV. Next colonoscopy April 2020  . COLONOSCOPY N/A 12/25/2014   SLF: 1. Colorectal polyps (2) removed 2. Small internal hemorrhoids 3. the left colon is severely redundant  . DOPPLER ECHOCARDIOGRAPHY    . ESOPHAGOGASTRODUODENOSCOPY N/A 12/25/2014   SLF: 1. Anemia most likely due to CRI, gastritis, gastric polyps 2. Moderate non-erosive gastriits and mild duodenitis.  3.TWo large gstric polyps removed.   Marland Kitchen EYE SURGERY  12/22/2010   tear duct probing-[place]  . FOREIGN BODY REMOVAL  03/29/2011   Procedure: REMOVAL FOREIGN BODY EXTREMITY;  Surgeon: Arther Abbott, MD;  Location: [facility];  Service: Orthopedics;  Laterality: Right;  Removal Foreign Body Right Thumb  . IR FLUORO GUIDE CV LINE RIGHT  08/06/2018  . IR US GUIDE VASC  ACCESS RIGHT  08/06/2018  . KNEE ARTHROSCOPY  10/2007   left  . KNEE ARTHROSCOPY WITH LATERAL MENISECTOMY Right 10/14/2015   Procedure: LEFT KNEE ARTHROSCOPY WITH PARTIAL LATERAL MENISECTOMY;  Surgeon: Carole Civil, MD;  Location: [facility];  Service: Orthopedics;  Laterality: Right;  . LEFT HEART CATHETERIZATION WITH CORONARY ANGIOGRAM N/A 03/28/2013   Procedure: LEFT HEART CATHETERIZATION WITH CORONARY ANGIOGRAM;  Surgeon: Leonie Man, MD;  Location: [facility] C[facility];  Service: Cardiovascular;  Laterality: N/A;  . NM MYOVIEW LTD    . PENILE PROSTHESIS IMPLANT N/A 08/16/2015   Procedure: PENILE PROTHESIS INFLATABLE, three piece, Excisional biopsy of Penile ulcer, Penile molding;  Surgeon: Carolan Clines, MD;  Location: [facility];  Service: Urology;  Laterality: N/A;  . PENILE PROSTHESIS IMPLANT N/A 12/24/2017   Procedure: REMOVAL AND  REPLACEMENT  COLOPLAST PENILE PROSTHESIS;  Surgeon: Lucas Mallow, MD;  Location: [facility];  Service: Urology;  Laterality: N/A;  . QUADRICEPS TENDON REPAIR  07/21/2011   Procedure: REPAIR QUADRICEP TENDON;  Surgeon: Arther Abbott, MD;  Location: [facility];  Service: Orthopedics;  Laterality: Right;  . TOENAIL EXCISION     removed [id number]  . UMBILICAL HERNIA REPAIR  2007   [place]    FAMHx:  Family History  Problem Relation Age of Onset  . Hypertension Mother        MI  . Cancer Mother        breast   . Diabetes Mother   . Diabetes Father   . Hypertension Father   . Hypertension Sister   . Diabetes Sister   . Arthritis Other   . Asthma Other   . Lung disease Other   . Anesthesia problems Neg Hx   . Hypotension Neg Hx   . Malignant hyperthermia Neg Hx   . Pseudochol deficiency Neg Hx   . Colon cancer Neg Hx     SOCHx:   reports that he quit smoking about 9 years ago. His smoking use included cigarettes. He has a 25.00 pack-year smoking history. He has never used smokeless tobacco. He reports current alcohol use. He reports previous drug  use. Drug: Marijuana.  ALLERGIES:  Allergies  Allergen Reactions  . Tramadol Hcl   . Opana [Oxymorphone Hcl] Itching  . Oxymorphone Itching  . Tramadol Itching    ROS: A  comprehensive review of systems was negative.  HOME MEDS: Current Outpatient Medications  Medication Sig Dispense Refill  . amLODipine (NORVASC) 5 MG tablet Take 1 tablet (5 mg total) by mouth daily. 90 tablet 3  . calcitRIOL (ROCALTROL) 0.25 MCG capsule Take 0.25 mcg by mouth daily.    . DROPLET PEN NEEDLES 31G X 5 MM MISC INJECT SUBCUTANEOUSLY TWICE DAILY AS DIRECTED 200 each 2  . fluticasone (FLONASE) 50 MCG/ACT nasal spray Place 2 sprays into both nostrils daily. 16 g 0  . LANTUS SOLOSTAR 100 UNIT/ML Solostar Pen INJECT  20 UNITS SUBCUTANEOUSLY AT BEDTIME 30 mL 0  . mometasone (NASONEX) 50 MCG/ACT nasal spray Place 2 sprays into the nose 2 (two) times daily as needed (allergies).     . NOVOLOG FLEXPEN 100 UNIT/ML FlexPen     . pantoprazole (PROTONIX) 40 MG tablet TAKE 1 TABLET TWICE DAILY 30 MINUTES PRIOR TO MEALS 180 tablet 3  . simvastatin (ZOCOR) 20 MG tablet Take 20 mg by mouth every morning.     . Tiotropium Bromide-Olodaterol (STIOLTO RESPIMAT) 2.5-2.5 MCG/ACT AERS Inhale 2 puffs into the lungs daily. 4 g 0  . Tiotropium Bromide-Olodaterol (STIOLTO RESPIMAT) 2.5-2.5 MCG/ACT AERS Inhale 2 puffs into the lungs daily. 4 g 0  . TRADJENTA 5 MG TABS tablet TAKE 1 TABLET EVERY DAY 90 tablet 0   No current facility-administered medications for this visit.    LABS/IMAGING: No results found for this or any previous visit (from the past 48 hour(s)). No results found.  VITALS: BP 120/70 (BP Location: Right Arm, Patient Position: Sitting, Cuff Size: Large)   Pulse 81   Ht 5\' 4"  (1.626 m)   Wt 236 lb 6.4 oz (107.2 kg)   SpO2 94%   BMI 40.58 kg/m   EXAM: General appearance: alert and no distress Neck: no carotid bruit and no JVD Lungs: clear to auscultation bilaterally Heart: regular rate and rhythm, S1,  S2 normal, no murmur, click, rub or gallop Abdomen: soft, non-tender; bowel sounds normal; no masses,  no organomegaly and morbidly obse Extremities: extremities normal, atraumatic, no cyanosis or edema Pulses: 2+ and symmetric Skin: Skin color, texture, turgor normal. No rashes or lesions Neurologic: Grossly normal Psyvh: Normal  EKG: Deferred  ASSESSMENT: 1. Asymptomatic hypotension 2. Mild coronary artery disease by cath in September 2014, question plaque rupture-on Plavix (no stent) 3. Insulin-dependent diabetes 4. Dyslipidemia-controlled 5. Hypertension 6. Morbid obesity 7. Erectile dysfunction - s/p implant 8. OSA on CPAP 9. ESRD - s/p LUE AV fistula, on PD QHS  PLAN: 1.   [name] [name] seems to be doing well without any chest pain or worsening shortness of breath.  He is doing peritoneal dialysis at night but does have a left upper extremity fistula.  Blood pressure is low at times but he is asymptomatic with this.  A1c was 6.40, indicating good control of his diabetes.  He came off of antiplatelet therapy for unknown reasons.  We will plan to restart aspirin 81 mg daily and check a lipid profile.  Follow-up with me annually or sooner as necessary.  Pixie Casino, MD, [facility], [place] Director of the [facility] Lipid Disorders &  Cardiovascular Risk [facility] Clinic Diplomate of the American Board of Clinical Lipidology Attending Cardiologist  Direct Dial: [phone]  Fax: [phone]  Website:  [email or web address] Leea Rambeau 01/28/2020, 10:14 AM

## 2020-01-28 NOTE — Patient Instructions (Signed)
Medication Instructions:  RESUME aspirin 81mg  daily  *If you need a refill on your cardiac medications before your next appointment, please call your pharmacy*   Lab Work: FASTING lipid panel   If you have labs (blood work) drawn today and your tests are completely normal, you will receive your results only by: Marland Kitchen MyChart Message (if you have MyChart) OR . A paper copy in the mail If you have any lab test that is abnormal or we need to change your treatment, we will call you to review the results.   Testing/Procedures: NONE   Follow-Up: At [facility], you and your health needs are our priority.  As part of our continuing mission to provide you with exceptional heart care, we have created designated Provider Care Teams.  These Care Teams include your primary Cardiologist (physician) and Advanced Practice Providers (APPs -  Physician Assistants and Nurse Practitioners) who all work together to provide you with the care you need, when you need it.  We recommend signing up for the patient portal called "MyChart".  Sign up information is provided on this After Visit Summary.  MyChart is used to connect with patients for Virtual Visits (Telemedicine).  Patients are able to view lab/test results, encounter notes, upcoming appointments, etc.  Non-urgent messages can be sent to your provider as well.   To learn more about what you can do with MyChart, go to [email or web address].    Your next appointment:   12 month(s)  The format for your next appointment:   In Person  Provider:   You may see Dr. Debara Pickett or one of the following Advanced Practice Providers on your designated Care Team:    Almyra Deforest, PA-C  Fabian Sharp, [place] or   Roby Lofts, [place]    Other Instructions

## 2020-01-29 DIAGNOSIS — N2581 Secondary hyperparathyroidism of renal origin: Secondary | ICD-10-CM | POA: Diagnosis not present

## 2020-01-29 DIAGNOSIS — D509 Iron deficiency anemia, unspecified: Secondary | ICD-10-CM | POA: Diagnosis not present

## 2020-01-29 DIAGNOSIS — Z992 Dependence on renal dialysis: Secondary | ICD-10-CM | POA: Diagnosis not present

## 2020-01-29 DIAGNOSIS — D631 Anemia in chronic kidney disease: Secondary | ICD-10-CM | POA: Diagnosis not present

## 2020-01-29 DIAGNOSIS — N186 End stage renal disease: Secondary | ICD-10-CM | POA: Diagnosis not present

## 2020-01-30 DIAGNOSIS — Z992 Dependence on renal dialysis: Secondary | ICD-10-CM | POA: Diagnosis not present

## 2020-01-30 DIAGNOSIS — D631 Anemia in chronic kidney disease: Secondary | ICD-10-CM | POA: Diagnosis not present

## 2020-01-30 DIAGNOSIS — N2581 Secondary hyperparathyroidism of renal origin: Secondary | ICD-10-CM | POA: Diagnosis not present

## 2020-01-30 DIAGNOSIS — N186 End stage renal disease: Secondary | ICD-10-CM | POA: Diagnosis not present

## 2020-01-30 DIAGNOSIS — D509 Iron deficiency anemia, unspecified: Secondary | ICD-10-CM | POA: Diagnosis not present

## 2020-01-31 DIAGNOSIS — D631 Anemia in chronic kidney disease: Secondary | ICD-10-CM | POA: Diagnosis not present

## 2020-01-31 DIAGNOSIS — D509 Iron deficiency anemia, unspecified: Secondary | ICD-10-CM | POA: Diagnosis not present

## 2020-01-31 DIAGNOSIS — Z992 Dependence on renal dialysis: Secondary | ICD-10-CM | POA: Diagnosis not present

## 2020-01-31 DIAGNOSIS — N186 End stage renal disease: Secondary | ICD-10-CM | POA: Diagnosis not present

## 2020-01-31 DIAGNOSIS — N2581 Secondary hyperparathyroidism of renal origin: Secondary | ICD-10-CM | POA: Diagnosis not present

## 2020-02-01 DIAGNOSIS — D509 Iron deficiency anemia, unspecified: Secondary | ICD-10-CM | POA: Diagnosis not present

## 2020-02-01 DIAGNOSIS — N186 End stage renal disease: Secondary | ICD-10-CM | POA: Diagnosis not present

## 2020-02-01 DIAGNOSIS — N2581 Secondary hyperparathyroidism of renal origin: Secondary | ICD-10-CM | POA: Diagnosis not present

## 2020-02-01 DIAGNOSIS — D631 Anemia in chronic kidney disease: Secondary | ICD-10-CM | POA: Diagnosis not present

## 2020-02-01 DIAGNOSIS — Z992 Dependence on renal dialysis: Secondary | ICD-10-CM | POA: Diagnosis not present

## 2020-02-02 DIAGNOSIS — E782 Mixed hyperlipidemia: Secondary | ICD-10-CM | POA: Diagnosis not present

## 2020-02-02 DIAGNOSIS — N186 End stage renal disease: Secondary | ICD-10-CM | POA: Diagnosis not present

## 2020-02-02 DIAGNOSIS — D631 Anemia in chronic kidney disease: Secondary | ICD-10-CM | POA: Diagnosis not present

## 2020-02-02 DIAGNOSIS — D509 Iron deficiency anemia, unspecified: Secondary | ICD-10-CM | POA: Diagnosis not present

## 2020-02-02 DIAGNOSIS — N2581 Secondary hyperparathyroidism of renal origin: Secondary | ICD-10-CM | POA: Diagnosis not present

## 2020-02-02 DIAGNOSIS — Z992 Dependence on renal dialysis: Secondary | ICD-10-CM | POA: Diagnosis not present

## 2020-02-03 DIAGNOSIS — D509 Iron deficiency anemia, unspecified: Secondary | ICD-10-CM | POA: Diagnosis not present

## 2020-02-03 DIAGNOSIS — Z992 Dependence on renal dialysis: Secondary | ICD-10-CM | POA: Diagnosis not present

## 2020-02-03 DIAGNOSIS — N2581 Secondary hyperparathyroidism of renal origin: Secondary | ICD-10-CM | POA: Diagnosis not present

## 2020-02-03 DIAGNOSIS — D631 Anemia in chronic kidney disease: Secondary | ICD-10-CM | POA: Diagnosis not present

## 2020-02-03 DIAGNOSIS — N186 End stage renal disease: Secondary | ICD-10-CM | POA: Diagnosis not present

## 2020-02-03 LAB — LIPID PANEL
Chol/HDL Ratio: 4.3 ratio (ref 0.0–5.0)
Cholesterol, Total: 117 mg/dL (ref 100–199)
HDL: 27 mg/dL — ABNORMAL LOW (ref >39)
LDL Chol Calc (NIH): 63 mg/dL (ref 0–99)
Triglycerides: 157 mg/dL — ABNORMAL HIGH (ref 0–149)
VLDL Cholesterol Cal: 27 mg/dL (ref 5–40)

## 2020-02-04 ENCOUNTER — Other Ambulatory Visit: Payer: Self-pay | Admitting: "Endocrinology

## 2020-02-04 ENCOUNTER — Encounter: Payer: Self-pay | Admitting: Internal Medicine

## 2020-02-04 DIAGNOSIS — D509 Iron deficiency anemia, unspecified: Secondary | ICD-10-CM | POA: Diagnosis not present

## 2020-02-04 DIAGNOSIS — N186 End stage renal disease: Secondary | ICD-10-CM | POA: Diagnosis not present

## 2020-02-04 DIAGNOSIS — D631 Anemia in chronic kidney disease: Secondary | ICD-10-CM | POA: Diagnosis not present

## 2020-02-04 DIAGNOSIS — N2581 Secondary hyperparathyroidism of renal origin: Secondary | ICD-10-CM | POA: Diagnosis not present

## 2020-02-04 DIAGNOSIS — Z992 Dependence on renal dialysis: Secondary | ICD-10-CM | POA: Diagnosis not present

## 2020-02-05 DIAGNOSIS — N186 End stage renal disease: Secondary | ICD-10-CM | POA: Diagnosis not present

## 2020-02-05 DIAGNOSIS — D509 Iron deficiency anemia, unspecified: Secondary | ICD-10-CM | POA: Diagnosis not present

## 2020-02-05 DIAGNOSIS — D631 Anemia in chronic kidney disease: Secondary | ICD-10-CM | POA: Diagnosis not present

## 2020-02-05 DIAGNOSIS — N2581 Secondary hyperparathyroidism of renal origin: Secondary | ICD-10-CM | POA: Diagnosis not present

## 2020-02-05 DIAGNOSIS — Z992 Dependence on renal dialysis: Secondary | ICD-10-CM | POA: Diagnosis not present

## 2020-02-06 DIAGNOSIS — N186 End stage renal disease: Secondary | ICD-10-CM | POA: Diagnosis not present

## 2020-02-06 DIAGNOSIS — Z992 Dependence on renal dialysis: Secondary | ICD-10-CM | POA: Diagnosis not present

## 2020-02-06 DIAGNOSIS — D509 Iron deficiency anemia, unspecified: Secondary | ICD-10-CM | POA: Diagnosis not present

## 2020-02-06 DIAGNOSIS — N2581 Secondary hyperparathyroidism of renal origin: Secondary | ICD-10-CM | POA: Diagnosis not present

## 2020-02-06 DIAGNOSIS — D631 Anemia in chronic kidney disease: Secondary | ICD-10-CM | POA: Diagnosis not present

## 2020-02-07 DIAGNOSIS — D631 Anemia in chronic kidney disease: Secondary | ICD-10-CM | POA: Diagnosis not present

## 2020-02-07 DIAGNOSIS — Z992 Dependence on renal dialysis: Secondary | ICD-10-CM | POA: Diagnosis not present

## 2020-02-07 DIAGNOSIS — D509 Iron deficiency anemia, unspecified: Secondary | ICD-10-CM | POA: Diagnosis not present

## 2020-02-07 DIAGNOSIS — N2581 Secondary hyperparathyroidism of renal origin: Secondary | ICD-10-CM | POA: Diagnosis not present

## 2020-02-07 DIAGNOSIS — N186 End stage renal disease: Secondary | ICD-10-CM | POA: Diagnosis not present

## 2020-02-08 DIAGNOSIS — Z992 Dependence on renal dialysis: Secondary | ICD-10-CM | POA: Diagnosis not present

## 2020-02-08 DIAGNOSIS — N2581 Secondary hyperparathyroidism of renal origin: Secondary | ICD-10-CM | POA: Diagnosis not present

## 2020-02-08 DIAGNOSIS — N186 End stage renal disease: Secondary | ICD-10-CM | POA: Diagnosis not present

## 2020-02-08 DIAGNOSIS — D631 Anemia in chronic kidney disease: Secondary | ICD-10-CM | POA: Diagnosis not present

## 2020-02-08 DIAGNOSIS — D509 Iron deficiency anemia, unspecified: Secondary | ICD-10-CM | POA: Diagnosis not present

## 2020-02-09 DIAGNOSIS — E1122 Type 2 diabetes mellitus with diabetic chronic kidney disease: Secondary | ICD-10-CM | POA: Diagnosis not present

## 2020-02-09 DIAGNOSIS — Z992 Dependence on renal dialysis: Secondary | ICD-10-CM | POA: Diagnosis not present

## 2020-02-09 DIAGNOSIS — N186 End stage renal disease: Secondary | ICD-10-CM | POA: Diagnosis not present

## 2020-02-09 DIAGNOSIS — N185 Chronic kidney disease, stage 5: Secondary | ICD-10-CM | POA: Diagnosis not present

## 2020-02-09 DIAGNOSIS — N2581 Secondary hyperparathyroidism of renal origin: Secondary | ICD-10-CM | POA: Diagnosis not present

## 2020-02-09 DIAGNOSIS — D509 Iron deficiency anemia, unspecified: Secondary | ICD-10-CM | POA: Diagnosis not present

## 2020-02-09 DIAGNOSIS — D631 Anemia in chronic kidney disease: Secondary | ICD-10-CM | POA: Diagnosis not present

## 2020-02-10 DIAGNOSIS — D509 Iron deficiency anemia, unspecified: Secondary | ICD-10-CM | POA: Diagnosis not present

## 2020-02-10 DIAGNOSIS — D631 Anemia in chronic kidney disease: Secondary | ICD-10-CM | POA: Diagnosis not present

## 2020-02-10 DIAGNOSIS — Z992 Dependence on renal dialysis: Secondary | ICD-10-CM | POA: Diagnosis not present

## 2020-02-10 DIAGNOSIS — N2581 Secondary hyperparathyroidism of renal origin: Secondary | ICD-10-CM | POA: Diagnosis not present

## 2020-02-10 DIAGNOSIS — N186 End stage renal disease: Secondary | ICD-10-CM | POA: Diagnosis not present

## 2020-02-10 LAB — COMPLETE METABOLIC PANEL WITH GFR
AG Ratio: 1.2 (calc) (ref 1.0–2.5)
ALT: 13 U/L (ref 9–46)
AST: 12 U/L (ref 10–35)
Albumin: 3.6 g/dL (ref 3.6–5.1)
Alkaline phosphatase (APISO): 52 U/L (ref 35–144)
BUN/Creatinine Ratio: 3 (calc) — ABNORMAL LOW (ref 6–22)
BUN: 64 mg/dL — ABNORMAL HIGH (ref 7–25)
CO2: 27 mmol/L (ref 20–32)
Calcium: 10.3 mg/dL (ref 8.6–10.3)
Chloride: 100 mmol/L (ref 98–110)
Creat: 18.89 mg/dL — ABNORMAL HIGH (ref 0.70–1.25)
GFR, Est African American: <4 mL/min/{1.73_m2} — ABNORMAL LOW (ref >=60)
GFR, Est Non African American: 2 mL/min/{1.73_m2} — ABNORMAL LOW (ref >=60)
Globulin: 3 g/dL (calc) (ref 1.9–3.7)
Glucose, Bld: 109 mg/dL — ABNORMAL HIGH (ref 65–99)
Potassium: 3.9 mmol/L (ref 3.5–5.3)
Sodium: 141 mmol/L (ref 135–146)
Total Bilirubin: 0.4 mg/dL (ref 0.2–1.2)
Total Protein: 6.6 g/dL (ref 6.1–8.1)

## 2020-02-10 LAB — MICROALBUMIN / CREATININE URINE RATIO
Creatinine, Urine: 219 mg/dL (ref 20–320)
Microalb Creat Ratio: 34 mcg/mg creat — ABNORMAL HIGH (ref <30)
Microalb, Ur: 7.4 mg/dL

## 2020-02-10 LAB — TSH: TSH: 2.3 mIU/L (ref 0.40–4.50)

## 2020-02-10 LAB — T4, FREE: Free T4: 0.9 ng/dL (ref 0.8–1.8)

## 2020-02-11 DIAGNOSIS — N2581 Secondary hyperparathyroidism of renal origin: Secondary | ICD-10-CM | POA: Diagnosis not present

## 2020-02-11 DIAGNOSIS — D509 Iron deficiency anemia, unspecified: Secondary | ICD-10-CM | POA: Diagnosis not present

## 2020-02-11 DIAGNOSIS — D631 Anemia in chronic kidney disease: Secondary | ICD-10-CM | POA: Diagnosis not present

## 2020-02-11 DIAGNOSIS — N186 End stage renal disease: Secondary | ICD-10-CM | POA: Diagnosis not present

## 2020-02-11 DIAGNOSIS — Z992 Dependence on renal dialysis: Secondary | ICD-10-CM | POA: Diagnosis not present

## 2020-02-12 DIAGNOSIS — N186 End stage renal disease: Secondary | ICD-10-CM | POA: Diagnosis not present

## 2020-02-12 DIAGNOSIS — Z992 Dependence on renal dialysis: Secondary | ICD-10-CM | POA: Diagnosis not present

## 2020-02-12 DIAGNOSIS — D509 Iron deficiency anemia, unspecified: Secondary | ICD-10-CM | POA: Diagnosis not present

## 2020-02-12 DIAGNOSIS — N2581 Secondary hyperparathyroidism of renal origin: Secondary | ICD-10-CM | POA: Diagnosis not present

## 2020-02-12 DIAGNOSIS — D631 Anemia in chronic kidney disease: Secondary | ICD-10-CM | POA: Diagnosis not present

## 2020-02-13 DIAGNOSIS — D631 Anemia in chronic kidney disease: Secondary | ICD-10-CM | POA: Diagnosis not present

## 2020-02-13 DIAGNOSIS — N186 End stage renal disease: Secondary | ICD-10-CM | POA: Diagnosis not present

## 2020-02-13 DIAGNOSIS — D509 Iron deficiency anemia, unspecified: Secondary | ICD-10-CM | POA: Diagnosis not present

## 2020-02-13 DIAGNOSIS — N2581 Secondary hyperparathyroidism of renal origin: Secondary | ICD-10-CM | POA: Diagnosis not present

## 2020-02-13 DIAGNOSIS — Z992 Dependence on renal dialysis: Secondary | ICD-10-CM | POA: Diagnosis not present

## 2020-02-14 DIAGNOSIS — N2581 Secondary hyperparathyroidism of renal origin: Secondary | ICD-10-CM | POA: Diagnosis not present

## 2020-02-14 DIAGNOSIS — Z992 Dependence on renal dialysis: Secondary | ICD-10-CM | POA: Diagnosis not present

## 2020-02-14 DIAGNOSIS — D631 Anemia in chronic kidney disease: Secondary | ICD-10-CM | POA: Diagnosis not present

## 2020-02-14 DIAGNOSIS — D509 Iron deficiency anemia, unspecified: Secondary | ICD-10-CM | POA: Diagnosis not present

## 2020-02-14 DIAGNOSIS — N186 End stage renal disease: Secondary | ICD-10-CM | POA: Diagnosis not present

## 2020-02-15 DIAGNOSIS — N186 End stage renal disease: Secondary | ICD-10-CM | POA: Diagnosis not present

## 2020-02-15 DIAGNOSIS — D509 Iron deficiency anemia, unspecified: Secondary | ICD-10-CM | POA: Diagnosis not present

## 2020-02-15 DIAGNOSIS — Z992 Dependence on renal dialysis: Secondary | ICD-10-CM | POA: Diagnosis not present

## 2020-02-15 DIAGNOSIS — D631 Anemia in chronic kidney disease: Secondary | ICD-10-CM | POA: Diagnosis not present

## 2020-02-15 DIAGNOSIS — N2581 Secondary hyperparathyroidism of renal origin: Secondary | ICD-10-CM | POA: Diagnosis not present

## 2020-02-15 IMAGING — CT CT MAXILLOFACIAL W/O CM
3 series · 15 of 47 positions shown, 18 images · non-contrast
Comparison: None.

CLINICAL DATA: Initial evaluation for acute sinusitis. Swelling and
pain to left eye.

EXAM:
CT MAXILLOFACIAL WITHOUT CONTRAST
TECHNIQUE: Multidetector CT imaging of the maxillofacial structures was
performed. Multiplanar CT image reconstructions were also generated.

[Series 2: max soft · axial · 0.42mm/px · z∈[+1236,+1410]mm · 9 of 103 slices shown, 12 images]
[im 8/103  brain]
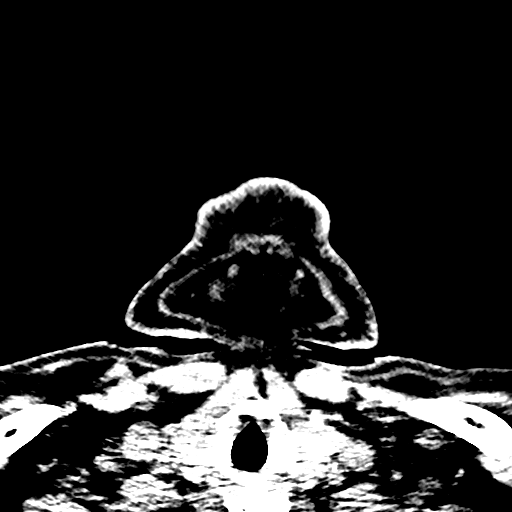
[im 8/103  bone]
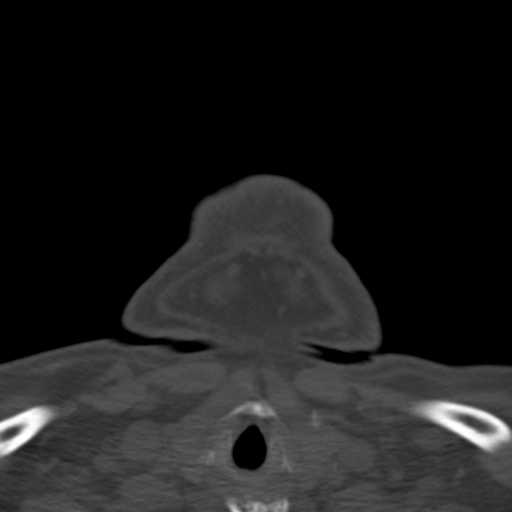
[im 18/103  bone]
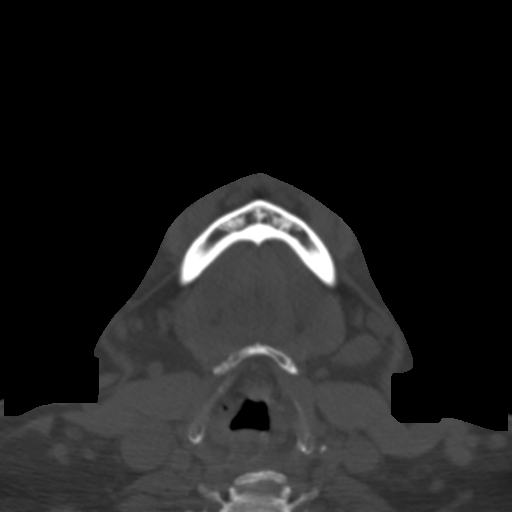
[im 29/103  bone]
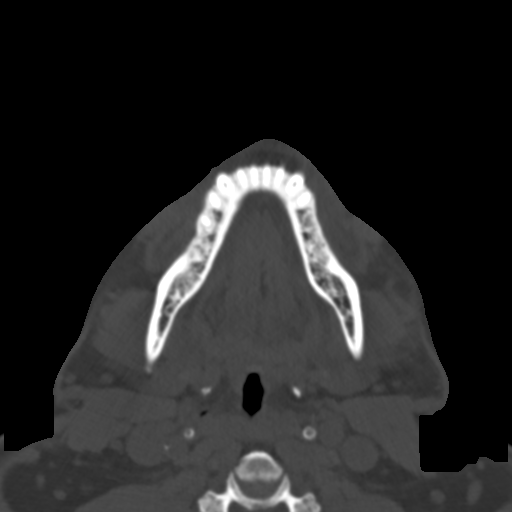
[im 39/103  bone]
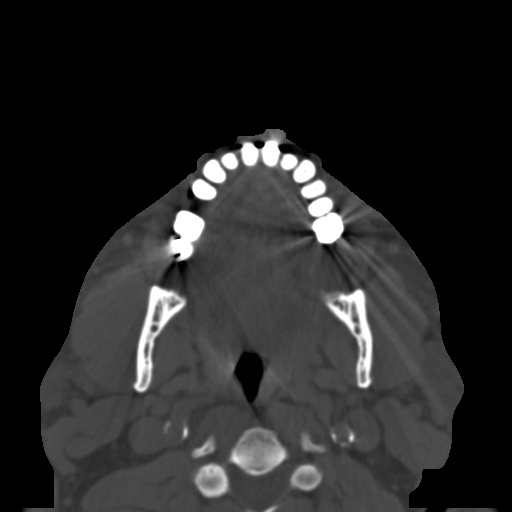
[im 53/103  brain]
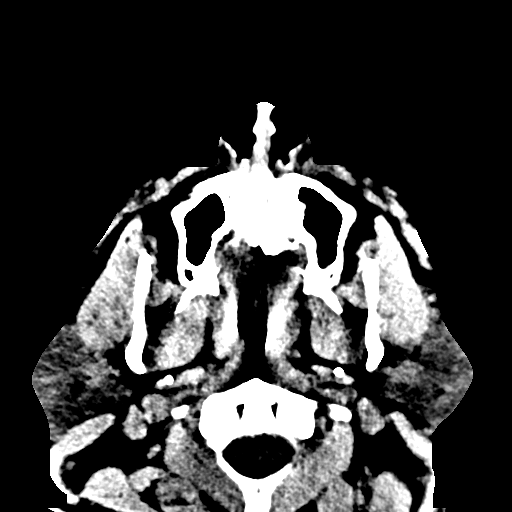
[im 53/103  bone]
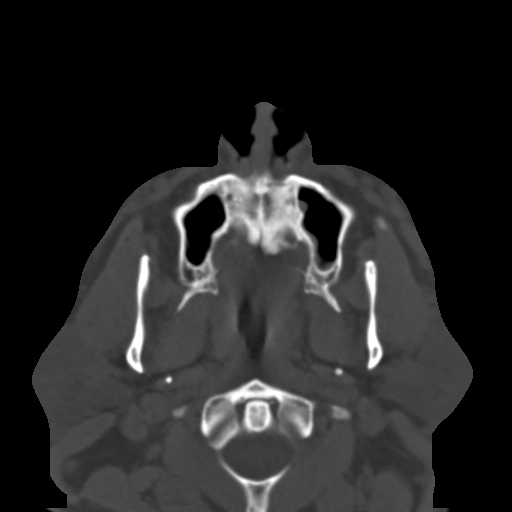
[im 64/103  bone]
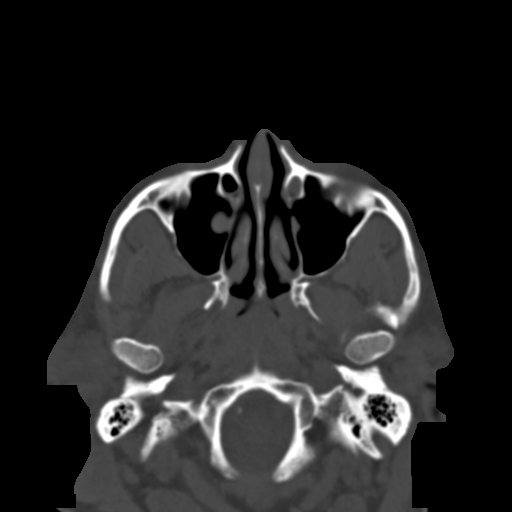
[im 74/103  bone]
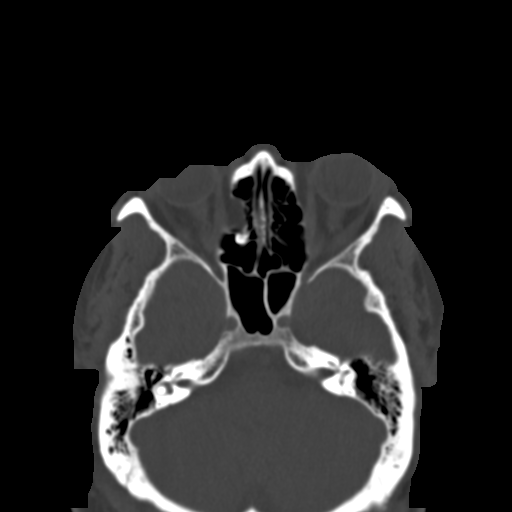
[im 85/103  bone]
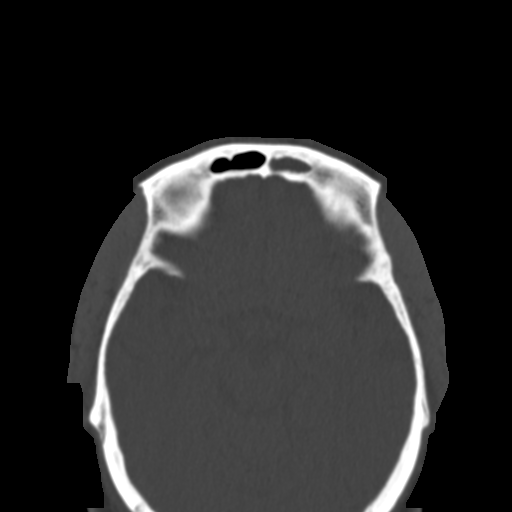
[im 95/103  brain]
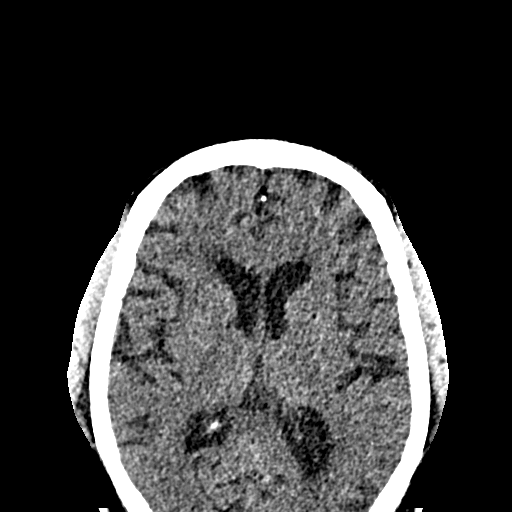
[im 95/103  bone]
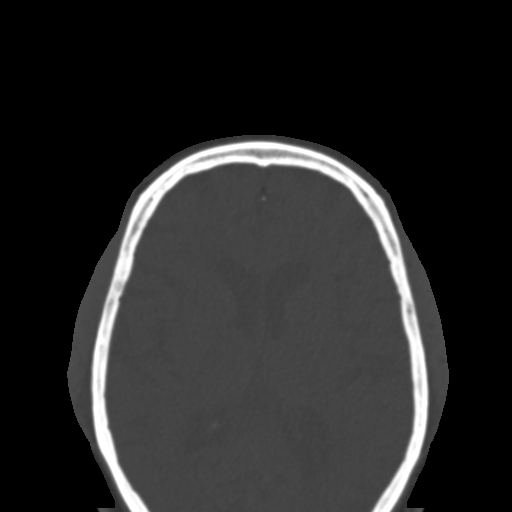

[Series 6: coronal soft · coronal · 0.46mm/px · 3 of 94 slices shown]
[im 32/94  bone]
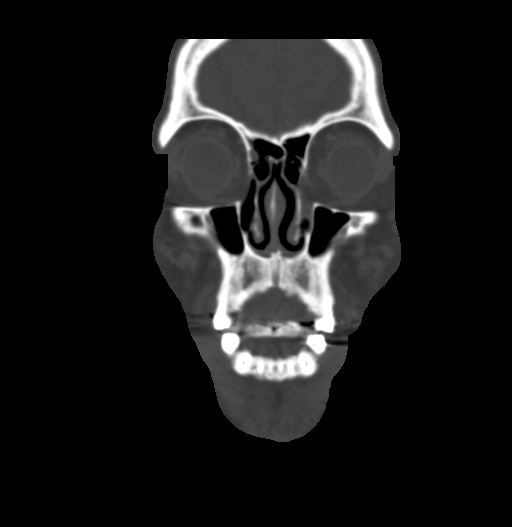
[im 42/94  bone]
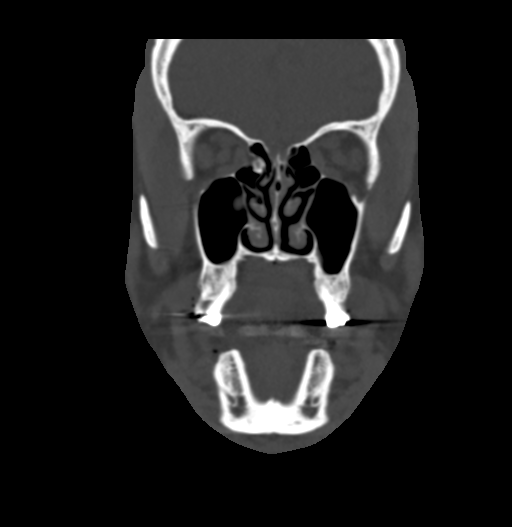
[im 52/94  bone]
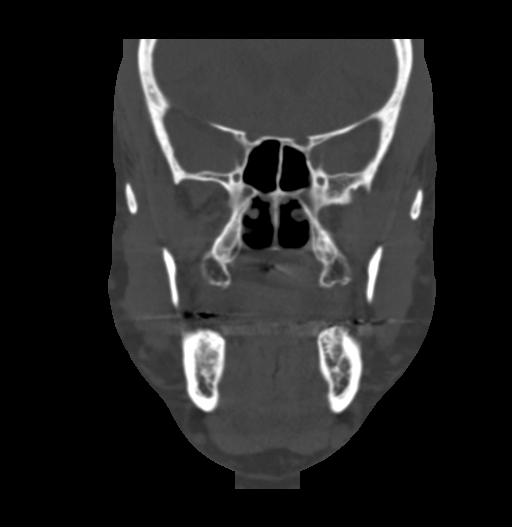

[Series 7: sagittal soft · sagittal · 0.39mm/px · 3 of 112 slices shown]
[im 38/112  bone]
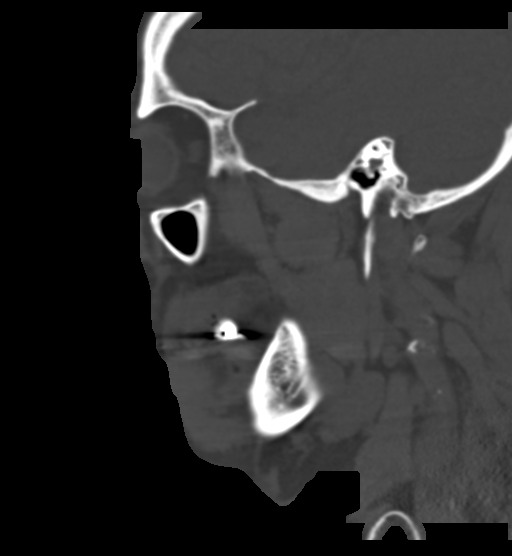
[im 56/112  bone]
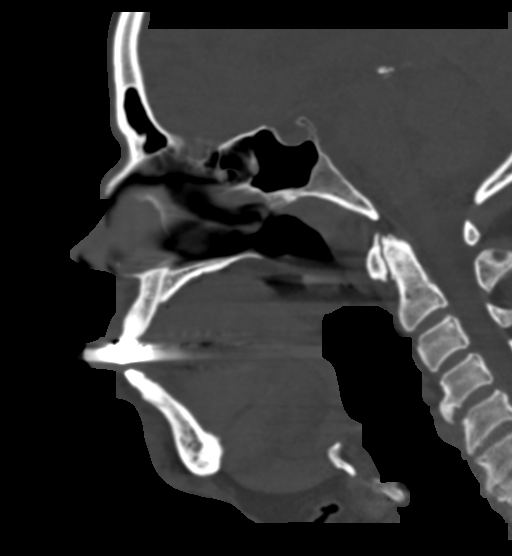
[im 75/112  bone]
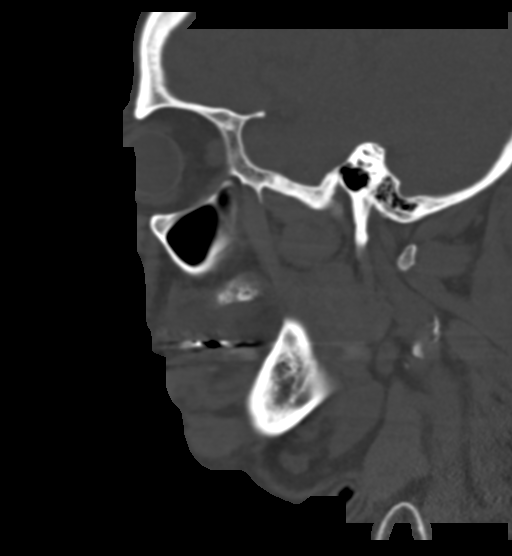

[15 of 47 positions shown; findings below may reference images not displayed]

FINDINGS: Osseous: No acute osseous abnormality about the face. No discrete
lytic or blastic osseous lesions.

Orbits: Asymmetric soft tissue swelling seen involving the left
preseptal periorbital soft tissues, most notable at the left upper
eyelid. Finding is nonspecific, but could reflect sequelae of acute
preseptal cellulitis. No discrete abscess or other collection
identified. No evidence for intraorbital or postseptal extension.
Remainder of the globes and orbital soft tissues within normal
limits. Patient status post bilateral ocular lens replacement.
Remote medial blowout fracture with herniation of a portion of the
intraorbital fat noted at the right orbit.

Sinuses: Chronic mucosal thickening noted at the left frontal sinus.
DISH in ule chronic mucoperiosteal thickening present within the
ethmoidal air cells, left sphenoid sinus, and maxillary sinuses.
Superimposed 11 mm retention cyst present at the right maxillary
sinus. 4 mm calcification at the posterior right ethmoidal air cells
likely reflects a small osteoma. No air-fluid level to suggest acute
sinusitis. Mastoid air cells and middle ear cavities are well
pneumatized and free of fluid.

Soft tissues: Remainder of the visualized soft tissues of the face
and neck demonstrate no acute finding. Atherosclerotic
calcifications noted about the carotid bifurcations bilaterally.

Limited intracranial: Unremarkable.
IMPRESSION: 1. Asymmetric soft tissue swelling involving the left preseptal
periorbital soft tissues, most notable at the left upper eyelid.
Finding is nonspecific, but could reflect sequelae of acute
preseptal cellulitis. No discrete abscess or other collection
identified. No evidence for intraorbital or postseptal extension.
2. Mild chronic paranasal sinus disease as above. No air-fluid
levels to suggest acute sinusitis at this time.
3. Remote medial blowout fracture at the right orbit.

## 2020-02-16 ENCOUNTER — Other Ambulatory Visit: Payer: Self-pay

## 2020-02-16 ENCOUNTER — Ambulatory Visit (INDEPENDENT_AMBULATORY_CARE_PROVIDER_SITE_OTHER): Payer: Medicare Other | Admitting: Nurse Practitioner

## 2020-02-16 ENCOUNTER — Encounter: Payer: Self-pay | Admitting: Nurse Practitioner

## 2020-02-16 VITALS — BP 104/69 | HR 69 | Ht 64.0 in | Wt 237.0 lb

## 2020-02-16 DIAGNOSIS — E782 Mixed hyperlipidemia: Secondary | ICD-10-CM

## 2020-02-16 DIAGNOSIS — I2510 Atherosclerotic heart disease of native coronary artery without angina pectoris: Secondary | ICD-10-CM

## 2020-02-16 DIAGNOSIS — E1122 Type 2 diabetes mellitus with diabetic chronic kidney disease: Secondary | ICD-10-CM

## 2020-02-16 DIAGNOSIS — D631 Anemia in chronic kidney disease: Secondary | ICD-10-CM | POA: Diagnosis not present

## 2020-02-16 DIAGNOSIS — N185 Chronic kidney disease, stage 5: Secondary | ICD-10-CM

## 2020-02-16 DIAGNOSIS — I10 Essential (primary) hypertension: Secondary | ICD-10-CM | POA: Diagnosis not present

## 2020-02-16 DIAGNOSIS — N2581 Secondary hyperparathyroidism of renal origin: Secondary | ICD-10-CM | POA: Diagnosis not present

## 2020-02-16 DIAGNOSIS — Z992 Dependence on renal dialysis: Secondary | ICD-10-CM | POA: Diagnosis not present

## 2020-02-16 DIAGNOSIS — D509 Iron deficiency anemia, unspecified: Secondary | ICD-10-CM | POA: Diagnosis not present

## 2020-02-16 DIAGNOSIS — N186 End stage renal disease: Secondary | ICD-10-CM | POA: Diagnosis not present

## 2020-02-16 LAB — POCT GLYCOSYLATED HEMOGLOBIN (HGB A1C): Hemoglobin A1C: 6 % — AB (ref 4.0–5.6)

## 2020-02-16 MED ORDER — LANTUS SOLOSTAR 100 UNIT/ML ~~LOC~~ SOPN: 30.0000 [IU] | PEN_INJECTOR | Freq: Every day | SUBCUTANEOUS | 2 refills | Status: DC

## 2020-02-16 NOTE — Patient Instructions (Signed)

## 2020-02-16 NOTE — Progress Notes (Signed)
02/16/2020   Endocrinology follow-up note   Subjective:    Patient ID: [name] [name], male    DOB: 08/11/1950, PCP [name], MD   Past Medical History:  Diagnosis Date  . Anemia   . Arthritis   . Asthma   . Bell palsy   . CAD (coronary artery disease)    a. 2014 MV: abnl w/ infap ischemia; b. 03/2013 Cath: aneurysmal bleb in the LAD w/ otw nonobs dzs-->Med Rx.  . Chronic back pain   . Chronic knee pain    a. 09/2015 s/p R TKA.  Marland Kitchen Chronic pain   . Chronic shoulder pain   . Chronic sinusitis   . CKD (chronic kidney disease), stage IV ([place])    stage 4 per office visit note of Dr Lowanda Foster on 05/2015   . COPD (chronic obstructive pulmonary disease) ([place])   . Diabetes mellitus without complication ([place])    type II   . Essential hypertension   . GERD (gastroesophageal reflux disease)   . Gout   . Gout   . Hepatomegaly    noted on noncontrast CT 2015  . History of hiatal hernia   . Hyperlipidemia   . Lateral meniscus tear   . Obesity    [place]  . Obstructive sleep apnea    does not use cpap   . On home oxygen therapy    uses 2l when is going somewhere per patient   . PUD (peptic ulcer disease)    remote, reports f/u EGD about 8 years ago unremarkable   . Reactive airway disease    related to exposure to chemical during 9/11  . Renal insufficiency   . Sinusitis   . Vitamin D deficiency    Past Surgical History:  Procedure Laterality Date  . ASAD LT SHOULDER  12/2008   left shoulder  . AV FISTULA PLACEMENT Left 08/09/2016   Procedure: BRACHIOCEPHALIC ARTERIOVENOUS (AV) FISTULA CREATION LEFT ARM;  Surgeon: [name], MD;  Location: [place];  Service: Vascular;  Laterality: Left;  . CAPD INSERTION N/A 10/07/2018   Procedure: LAPAROSCOPIC PERITONEAL CATHETER PLACEMENT;  Surgeon: Kieth Brightly Arta Bruce, MD;  Location: [facility];  Service: General;  Laterality: N/A;  . CATARACT EXTRACTION W/PHACO Left 03/28/2016   Procedure: CATARACT EXTRACTION PHACO  AND INTRAOCULAR LENS PLACEMENT LEFT EYE;  Surgeon: [name], MD;  Location: [facility];  Service: Ophthalmology;  Laterality: Left;  CDE: 4.77  . CATARACT EXTRACTION W/PHACO Right 04/11/2016   Procedure: CATARACT EXTRACTION PHACO AND INTRAOCULAR LENS PLACEMENT RIGHT EYE; CDE:  4.74;  Surgeon: [name], MD;  Location: [facility];  Service: Ophthalmology;  Laterality: Right;  . COLONOSCOPY  10/2008   Fields: Rectal polyp obliterated, not retrieved, hemorrhoids, single ascending colon diverticulum near the CV. Next colonoscopy April 2020  . COLONOSCOPY N/A 12/25/2014   SLF: 1. Colorectal polyps (2) removed 2. Small internal hemorrhoids 3. the left colon is severely redundant  . DOPPLER ECHOCARDIOGRAPHY    . ESOPHAGOGASTRODUODENOSCOPY N/A 12/25/2014   SLF: 1. Anemia most likely due to CRI, gastritis, gastric polyps 2. Moderate non-erosive gastriits and mild duodenitis.  3.TWo large gstric polyps removed.   Marland Kitchen EYE SURGERY  12/22/2010   tear duct probing-[place]  . FOREIGN BODY REMOVAL  03/29/2011   Procedure: REMOVAL FOREIGN BODY EXTREMITY;  Surgeon: Arther Abbott, MD;  Location: [facility];  Service: Orthopedics;  Laterality: Right;  Removal Foreign Body Right Thumb  .  IR FLUORO GUIDE CV LINE RIGHT  08/06/2018  . IR US GUIDE VASC ACCESS RIGHT  08/06/2018  . KNEE ARTHROSCOPY  10/2007   left  . KNEE ARTHROSCOPY WITH LATERAL MENISECTOMY Right 10/14/2015   Procedure: LEFT KNEE ARTHROSCOPY WITH PARTIAL LATERAL MENISECTOMY;  Surgeon: Carole Civil, MD;  Location: [facility];  Service: Orthopedics;  Laterality: Right;  . LEFT HEART CATHETERIZATION WITH CORONARY ANGIOGRAM N/A 03/28/2013   Procedure: LEFT HEART CATHETERIZATION WITH CORONARY ANGIOGRAM;  Surgeon: Leonie Man, MD;  Location: [facility];  Service: Cardiovascular;  Laterality: N/A;  . NM MYOVIEW LTD    . PENILE PROSTHESIS IMPLANT N/A 08/16/2015   Procedure: PENILE PROTHESIS INFLATABLE, three piece, Excisional biopsy of Penile ulcer, Penile molding;   Surgeon: Carolan Clines, MD;  Location: [facility];  Service: Urology;  Laterality: N/A;  . PENILE PROSTHESIS IMPLANT N/A 12/24/2017   Procedure: REMOVAL AND  REPLACEMENT  COLOPLAST PENILE PROSTHESIS;  Surgeon: Lucas Mallow, MD;  Location: [facility];  Service: Urology;  Laterality: N/A;  . QUADRICEPS TENDON REPAIR  07/21/2011   Procedure: REPAIR QUADRICEP TENDON;  Surgeon: Arther Abbott, MD;  Location: [facility];  Service: Orthopedics;  Laterality: Right;  . TOENAIL EXCISION     removed [id number]  . UMBILICAL HERNIA REPAIR  2007   [place]   Social History   Socioeconomic History  . Marital status: Married    Spouse name: Not on file  . Number of children: 2  . Years of education: 12th grade  . Highest education level: Not on file  Occupational History  . Occupation: disabled  . Occupation:      Employer: UNEMPLOYED  Tobacco Use  . Smoking status: Former Smoker    Packs/day: 1.00    Years: 25.00    Pack years: 25.00    Types: Cigarettes    Quit date: 03/27/2010    Years since quitting: 9.8  . Smokeless tobacco: Never Used  . Tobacco comment: Quit x 7 years  Vaping Use  . Vaping Use: Never used  Substance and Sexual Activity  . Alcohol use: Yes    Alcohol/week: 0.0 standard drinks    Comment: occasionally  . Drug use: Not Currently    Types: Marijuana    Comment: cocaine- last time used- 11/24/2017 , marijuana-   . Sexual activity: Not on file  Other Topics Concern  . Not on file  Social History Narrative   He quit smoking in 2010. 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 He developed pulmonary problems, became disabled because of lower airway disease in 2009.       WATCHES BASKETBALL. HIS TEAM IS [place].   Social Determinants of Health   Financial Resource Strain:   . Difficulty of Paying Living Expenses:   Food Insecurity:   . Worried About Charity fundraiser in the Last Year:   . Arboriculturist in the Last Year:    Transportation Needs:   . Film/video editor (Medical):   Marland Kitchen Lack of Transportation (Non-Medical):   Physical Activity:   . Days of Exercise per Week:   . Minutes of Exercise per Session:   Stress:   . Feeling of Stress :   Social Connections:   . Frequency of Communication with Friends and Family:   . Frequency of Social Gatherings with Friends and Family:   . Attends Religious Services:   . Active Member of Clubs or Organizations:   .  Attends Archivist Meetings:   Marland Kitchen Marital Status:    Outpatient Encounter Medications as of 02/16/2020  Medication Sig  . amLODipine (NORVASC) 5 MG tablet Take 1 tablet (5 mg total) by mouth daily.  Marland Kitchen aspirin EC 81 MG tablet Take 81 mg by mouth daily. Swallow whole.  . calcitRIOL (ROCALTROL) 0.25 MCG capsule Take 0.25 mcg by mouth daily.  . DROPLET PEN NEEDLES 31G X 5 MM MISC INJECT SUBCUTANEOUSLY TWICE DAILY AS DIRECTED  . fluticasone (FLONASE) 50 MCG/ACT nasal spray Place 2 sprays into both nostrils daily.  . insulin glargine (LANTUS SOLOSTAR) 100 UNIT/ML Solostar Pen Inject 30 Units into the skin at bedtime.  . mometasone (NASONEX) 50 MCG/ACT nasal spray Place 2 sprays into the nose 2 (two) times daily as needed (allergies).   . NOVOLOG FLEXPEN 100 UNIT/ML FlexPen   . pantoprazole (PROTONIX) 40 MG tablet TAKE 1 TABLET TWICE DAILY 30 MINUTES PRIOR TO MEALS  . simvastatin (ZOCOR) 20 MG tablet Take 20 mg by mouth every morning.   . Tiotropium Bromide-Olodaterol (STIOLTO RESPIMAT) 2.5-2.5 MCG/ACT AERS Inhale 2 puffs into the lungs daily.  . TRADJENTA 5 MG TABS tablet TAKE 1 TABLET EVERY DAY  . [DISCONTINUED] LANTUS SOLOSTAR 100 UNIT/ML Solostar Pen INJECT  20 UNITS SUBCUTANEOUSLY AT BEDTIME  . losartan (COZAAR) 100 MG tablet   . [DISCONTINUED] Tiotropium Bromide-Olodaterol (STIOLTO RESPIMAT) 2.5-2.5 MCG/ACT AERS Inhale 2 puffs into the lungs daily.   No facility-administered encounter medications on file as of 02/16/2020.    ALLERGIES: Allergies  Allergen Reactions  . Tramadol Hcl   . Opana [Oxymorphone Hcl] Itching  . Oxymorphone Itching  . Tramadol Itching   VACCINATION STATUS: Immunization History  Administered Date(s) Administered  . Influenza, High Dose Seasonal PF 04/17/2019  . Influenza-Unspecified 09/12/2018, 04/01/2019  . PPD Test 07/19/2018, 09/07/2018, 11/18/2019  . Pneumococcal Polysaccharide-23 09/13/2018  . Td 03/27/2011    Diabetes He presents for his follow-up diabetic visit. He has type 2 diabetes mellitus. Onset time: He was diagnosed approximately 20 years ago. His disease course has been improving. There are no hypoglycemic associated symptoms. Pertinent negatives for hypoglycemia include no confusion, headaches, pallor or seizures. Pertinent negatives for diabetes include no chest pain, no fatigue, no polydipsia, no polyphagia, no polyuria and no weakness. There are no hypoglycemic complications. Symptoms are stable. Diabetic complications include nephropathy. (Patient with end-stage renal disease now on hemodialysis.) Risk factors for coronary artery disease include dyslipidemia, diabetes mellitus, hypertension, obesity, sedentary lifestyle, tobacco exposure and male sex. Current diabetic treatment includes insulin injections and oral agent (monotherapy). He is compliant with treatment most of the time. His weight is stable. He is following a generally unhealthy diet. He has had a previous visit with a dietitian. He rarely participates in exercise. His home blood glucose trend is decreasing steadily. His breakfast blood glucose range is generally 110-130 mg/dl. (Patient presents today with logs showing tight fasting glycemic profile.  His A1c today is 6% overall improving from last visit at 6.4% on 10/14/2019.  He does have some random fasting hypoglycemia, but denies symptoms.) An ACE inhibitor/angiotensin II receptor blocker is contraindicated. He sees a podiatrist.Eye exam is current.   Hypertension This is a chronic problem. The current episode started more than 1 year ago. The problem is controlled. Pertinent negatives include no chest pain, headaches, neck pain, palpitations or shortness of breath. Risk factors for coronary artery disease include diabetes mellitus, dyslipidemia, obesity, smoking/tobacco exposure, sedentary lifestyle and male gender. Past treatments include angiotensin blockers.  Hypertensive end-organ damage includes kidney disease.  Hyperlipidemia This is a chronic problem. The current episode started more than 1 year ago. The problem is controlled. Recent lipid tests were reviewed and are variable (LDL normal at 63 triglycerides elevated at 157.). Exacerbating diseases include diabetes and obesity. Factors aggravating his hyperlipidemia include fatty foods. Pertinent negatives include no chest pain, myalgias or shortness of breath. Current antihyperlipidemic treatment includes statins. The current treatment provides moderate improvement of lipids. Compliance problems include adherence to diet.  Risk factors for coronary artery disease include diabetes mellitus, dyslipidemia, hypertension, male sex and a sedentary lifestyle.    Review of systems  Constitutional: + Minimally fluctuating body weight,  current  Body mass index is 40.58 kg/m. , no fatigue, no subjective hyperthermia, no subjective hypothermia Eyes: no blurry vision, no xerophthalmia ENT: no sore throat, no nodules palpated in throat, no dysphagia/odynophagia, no hoarseness Cardiovascular: no Chest Pain, no Shortness of Breath, no palpitations, no leg swelling Respiratory: no cough, no shortness of breath Gastrointestinal: no Nausea/Vomiting/Diarhhea Musculoskeletal: no muscle/joint aches Skin: no rashes, no hyperemia Neurological: no tremors, no numbness, no tingling, no dizziness Psychiatric: no depression, no anxiety   Objective:    BP 104/69 (BP Location: Right Arm, Patient Position:  Sitting)   Pulse 69   Ht 5\' 4"  (1.626 m)   Wt (!) 237 lb (107.5 kg)   BMI 40.68 kg/m   Wt Readings from Last 3 Encounters:  02/16/20 (!) 237 lb (107.5 kg)  01/28/20 236 lb 6.4 oz (107.2 kg)  01/05/20 238 lb 6.4 oz (108.1 kg)      Physical Exam- Limited  Constitutional:  Body mass index is 40.68 kg/m. , not in acute distress, normal state of mind Eyes:  EOMI, no exophthalmos Neck: Supple Thyroid: No gross goiter Cardiovascular: RRR, no murmers, rubs, or gallops, no edema Respiratory: Adequate breathing efforts, no crackles, rales, rhonchi, or wheezing Musculoskeletal: no gross deformities, strength intact in all four extremities, no gross restriction of joint movements Skin:  no rashes, no hyperemia Neurological: no tremor with outstretched hands  Foot exam:   No rashes, ulcers, cuts, calluses, onychodystrophy.   Good pulses bilat.  Good sensation to 10 g monofilament bilat.     Results for orders placed or performed in visit on 02/16/20  HgB A1c  Result Value Ref Range   Hemoglobin A1C 6.0 (A) 4.0 - 5.6 %   HbA1c POC (<> result, manual entry)     HbA1c, POC (prediabetic range)     HbA1c, POC (controlled diabetic range)     Diabetic Labs (most recent): Lab Results  Component Value Date   HGBA1C 6.0 (A) 02/16/2020   HGBA1C 6.4 (A) 10/14/2019   HGBA1C 6.6 (H) 04/09/2019   Lipid Panel     Component Value Date/Time   CHOL 117 02/02/2020 1410   TRIG 157 (H) 02/02/2020 1410   HDL 27 (L) 02/02/2020 1410   CHOLHDL 4.3 02/02/2020 1410   CHOLHDL 2.8 09/06/2018 1018   VLDL 38 (H) 06/16/2016 0956   LDLCALC 63 02/02/2020 1410   LDLCALC 52 09/06/2018 1018     Assessment & Plan:   1. Type 2 diabetes mellitus with stage 4 chronic kidney disease, with long-term current use of insulin (HCC)  -His  Diabetes is complicated by end-stage renal disease recently initiated on hemodialysis,  and patient remains at a high risk for more acute and chronic complications of  diabetes which include CAD, CVA, CKD, retinopathy, and neuropathy. These are all discussed in  detail with the patient.  Patient presents today with logs showing tight fasting glycemic profile.  His A1c today is 6% overall improving from last visit at 6.4% on 10/14/2019.  He does have some random fasting hypoglycemia, but denies symptoms.   Recent labs reviewed. - I have re-counseled the patient on diet management and weight loss  by adopting a carbohydrate restricted / protein rich  Diet.  - The patient admits there is a room for improvement in their diet and drink choices. -  Suggestion is made for the patient to avoid simple carbohydrates from their diet including Cakes, Sweet Desserts / Pastries, Ice Cream, Soda (diet and regular), Sweet Tea, Candies, Chips, Cookies, Sweet Pastries,  Store Bought Juices, Alcohol in Excess of  1-2 drinks a day, Artificial Sweeteners, Coffee Creamer, and "Sugar-free" Products. This will help patient to have stable blood glucose profile and potentially avoid unintended weight gain.   - I encouraged the patient to switch to  unprocessed or minimally processed complex starch and increased protein intake (animal or plant source), fruits, and vegetables.   - Patient is advised to stick to a routine mealtimes to eat 3 meals  a day and avoid unnecessary snacks ( to snack only to correct hypoglycemia).   - I have approached patient with the following individualized plan to manage diabetes and patient agrees.   -He is advised to stay away from alcohol and smoking.  -He is advised to lower his Lantus to 30 units SQ nightly.  Encouraged patient to test blood glucose twice a day, once before breakfast, and once before bed. -Continue Tradjenta 5 mg p.o. daily with breakfast.  He is following with Jearld Fenton, CDE for DM education.   -He will continue to follow-up with nephrology  given stage 4-5 CKD, he has a dialysis fistula in place.    -he is not a candidate for  metformin and SGLT2 inhibitors due to CKD.  - Patient specific target  for A1c; LDL, HDL, Triglycerides, were discussed in detail.  2) BP/HTN:  His blood pressure is controlled to target.  He is advised to continue amlodipine 5 mg p.o. daily.    3) Lipids/HPL:  His recent lipid panel shows LDL stable at 63 and triglycerides worsening at 157.  He is advised to continue simvastatin 20 mg p.o. nightly.  Discussed dietary changes to lower triglyceride number.  Wants to work on lifestyle modifications.  Will consider adding fenofibrate if patient unable to control triglycerides with diet and exercise alone.  4)  Weight/Diet:  His BMI is 40.6- clearly complicating his DM care. He is a candidate for modest weight loss.   CDE consult in progress, exercise, and carbohydrates information provided.   5) Chronic Care/Health Maintenance:  -Patient is on  Statin medications and encouraged to continue to follow up with Ophthalmology, nephrology (he is status post fistula placement in prep for hemodialysis) Podiatrist at least yearly or according to recommendations, and advised to quit smoking. I have recommended yearly flu vaccine and pneumonia vaccination at least every 5 years; moderate intensity exercise for up to 150 minutes weekly; and  sleep for at least 7 hours a day. - I advised patient to maintain close follow up with his PCP for primary care needs.      - Time spent on this patient care encounter:  35 min, of which > 50% was spent in  counseling and the rest reviewing his blood glucose logs , discussing his hypoglycemia and hyperglycemia episodes, reviewing his  current and  previous labs / studies  ( including abstraction from other facilities) and medications  doses and developing a  long term treatment plan and documenting his care.   Please refer to Patient Instructions for Blood Glucose Monitoring and Insulin/Medications Dosing Guide"  in media tab for additional information. Please  also refer to  " Patient Self Inventory" in the Media  tab for reviewed elements of pertinent patient history.  [name] [name] participated in the discussions, expressed understanding, and voiced agreement with the above plans.  All questions were answered to his satisfaction. he is encouraged to contact clinic should he have any questions or concerns prior to his return visit.  Follow up plan: -Return in about 4 months (around 06/17/2020) for Diabetes follow up with A1c in office, No previsit labs, Bring glucometer and logs.  Rayetta Pigg, FNP-BC [facility] Phone: [phone] Fax: [phone]   02/16/2020, 10:35 AM

## 2020-02-17 DIAGNOSIS — D509 Iron deficiency anemia, unspecified: Secondary | ICD-10-CM | POA: Diagnosis not present

## 2020-02-17 DIAGNOSIS — D631 Anemia in chronic kidney disease: Secondary | ICD-10-CM | POA: Diagnosis not present

## 2020-02-17 DIAGNOSIS — Z992 Dependence on renal dialysis: Secondary | ICD-10-CM | POA: Diagnosis not present

## 2020-02-17 DIAGNOSIS — N186 End stage renal disease: Secondary | ICD-10-CM | POA: Diagnosis not present

## 2020-02-17 DIAGNOSIS — N2581 Secondary hyperparathyroidism of renal origin: Secondary | ICD-10-CM | POA: Diagnosis not present

## 2020-02-18 DIAGNOSIS — D631 Anemia in chronic kidney disease: Secondary | ICD-10-CM | POA: Diagnosis not present

## 2020-02-18 DIAGNOSIS — D509 Iron deficiency anemia, unspecified: Secondary | ICD-10-CM | POA: Diagnosis not present

## 2020-02-18 DIAGNOSIS — N186 End stage renal disease: Secondary | ICD-10-CM | POA: Diagnosis not present

## 2020-02-18 DIAGNOSIS — N2581 Secondary hyperparathyroidism of renal origin: Secondary | ICD-10-CM | POA: Diagnosis not present

## 2020-02-18 DIAGNOSIS — Z992 Dependence on renal dialysis: Secondary | ICD-10-CM | POA: Diagnosis not present

## 2020-02-19 DIAGNOSIS — Z992 Dependence on renal dialysis: Secondary | ICD-10-CM | POA: Diagnosis not present

## 2020-02-19 DIAGNOSIS — N186 End stage renal disease: Secondary | ICD-10-CM | POA: Diagnosis not present

## 2020-02-19 DIAGNOSIS — N2581 Secondary hyperparathyroidism of renal origin: Secondary | ICD-10-CM | POA: Diagnosis not present

## 2020-02-19 DIAGNOSIS — D631 Anemia in chronic kidney disease: Secondary | ICD-10-CM | POA: Diagnosis not present

## 2020-02-19 DIAGNOSIS — D509 Iron deficiency anemia, unspecified: Secondary | ICD-10-CM | POA: Diagnosis not present

## 2020-02-20 DIAGNOSIS — N2581 Secondary hyperparathyroidism of renal origin: Secondary | ICD-10-CM | POA: Diagnosis not present

## 2020-02-20 DIAGNOSIS — D509 Iron deficiency anemia, unspecified: Secondary | ICD-10-CM | POA: Diagnosis not present

## 2020-02-20 DIAGNOSIS — N186 End stage renal disease: Secondary | ICD-10-CM | POA: Diagnosis not present

## 2020-02-20 DIAGNOSIS — D631 Anemia in chronic kidney disease: Secondary | ICD-10-CM | POA: Diagnosis not present

## 2020-02-20 DIAGNOSIS — Z992 Dependence on renal dialysis: Secondary | ICD-10-CM | POA: Diagnosis not present

## 2020-02-21 DIAGNOSIS — N186 End stage renal disease: Secondary | ICD-10-CM | POA: Diagnosis not present

## 2020-02-21 DIAGNOSIS — D509 Iron deficiency anemia, unspecified: Secondary | ICD-10-CM | POA: Diagnosis not present

## 2020-02-21 DIAGNOSIS — Z992 Dependence on renal dialysis: Secondary | ICD-10-CM | POA: Diagnosis not present

## 2020-02-21 DIAGNOSIS — N2581 Secondary hyperparathyroidism of renal origin: Secondary | ICD-10-CM | POA: Diagnosis not present

## 2020-02-21 DIAGNOSIS — D631 Anemia in chronic kidney disease: Secondary | ICD-10-CM | POA: Diagnosis not present

## 2020-02-22 DIAGNOSIS — Z992 Dependence on renal dialysis: Secondary | ICD-10-CM | POA: Diagnosis not present

## 2020-02-22 DIAGNOSIS — D509 Iron deficiency anemia, unspecified: Secondary | ICD-10-CM | POA: Diagnosis not present

## 2020-02-22 DIAGNOSIS — N2581 Secondary hyperparathyroidism of renal origin: Secondary | ICD-10-CM | POA: Diagnosis not present

## 2020-02-22 DIAGNOSIS — N186 End stage renal disease: Secondary | ICD-10-CM | POA: Diagnosis not present

## 2020-02-22 DIAGNOSIS — D631 Anemia in chronic kidney disease: Secondary | ICD-10-CM | POA: Diagnosis not present

## 2020-02-23 DIAGNOSIS — D631 Anemia in chronic kidney disease: Secondary | ICD-10-CM | POA: Diagnosis not present

## 2020-02-23 DIAGNOSIS — D509 Iron deficiency anemia, unspecified: Secondary | ICD-10-CM | POA: Diagnosis not present

## 2020-02-23 DIAGNOSIS — N186 End stage renal disease: Secondary | ICD-10-CM | POA: Diagnosis not present

## 2020-02-23 DIAGNOSIS — N2581 Secondary hyperparathyroidism of renal origin: Secondary | ICD-10-CM | POA: Diagnosis not present

## 2020-02-23 DIAGNOSIS — Z992 Dependence on renal dialysis: Secondary | ICD-10-CM | POA: Diagnosis not present

## 2020-02-24 DIAGNOSIS — N186 End stage renal disease: Secondary | ICD-10-CM | POA: Diagnosis not present

## 2020-02-24 DIAGNOSIS — D509 Iron deficiency anemia, unspecified: Secondary | ICD-10-CM | POA: Diagnosis not present

## 2020-02-24 DIAGNOSIS — Z992 Dependence on renal dialysis: Secondary | ICD-10-CM | POA: Diagnosis not present

## 2020-02-24 DIAGNOSIS — N2581 Secondary hyperparathyroidism of renal origin: Secondary | ICD-10-CM | POA: Diagnosis not present

## 2020-02-24 DIAGNOSIS — D631 Anemia in chronic kidney disease: Secondary | ICD-10-CM | POA: Diagnosis not present

## 2020-02-25 DIAGNOSIS — N186 End stage renal disease: Secondary | ICD-10-CM | POA: Diagnosis not present

## 2020-02-25 DIAGNOSIS — D631 Anemia in chronic kidney disease: Secondary | ICD-10-CM | POA: Diagnosis not present

## 2020-02-25 DIAGNOSIS — Z992 Dependence on renal dialysis: Secondary | ICD-10-CM | POA: Diagnosis not present

## 2020-02-25 DIAGNOSIS — N2581 Secondary hyperparathyroidism of renal origin: Secondary | ICD-10-CM | POA: Diagnosis not present

## 2020-02-25 DIAGNOSIS — D509 Iron deficiency anemia, unspecified: Secondary | ICD-10-CM | POA: Diagnosis not present

## 2020-02-26 DIAGNOSIS — Z992 Dependence on renal dialysis: Secondary | ICD-10-CM | POA: Diagnosis not present

## 2020-02-26 DIAGNOSIS — D631 Anemia in chronic kidney disease: Secondary | ICD-10-CM | POA: Diagnosis not present

## 2020-02-26 DIAGNOSIS — N186 End stage renal disease: Secondary | ICD-10-CM | POA: Diagnosis not present

## 2020-02-26 DIAGNOSIS — N2581 Secondary hyperparathyroidism of renal origin: Secondary | ICD-10-CM | POA: Diagnosis not present

## 2020-02-26 DIAGNOSIS — D509 Iron deficiency anemia, unspecified: Secondary | ICD-10-CM | POA: Diagnosis not present

## 2020-02-27 DIAGNOSIS — N186 End stage renal disease: Secondary | ICD-10-CM | POA: Diagnosis not present

## 2020-02-27 DIAGNOSIS — D631 Anemia in chronic kidney disease: Secondary | ICD-10-CM | POA: Diagnosis not present

## 2020-02-27 DIAGNOSIS — N2581 Secondary hyperparathyroidism of renal origin: Secondary | ICD-10-CM | POA: Diagnosis not present

## 2020-02-27 DIAGNOSIS — Z992 Dependence on renal dialysis: Secondary | ICD-10-CM | POA: Diagnosis not present

## 2020-02-27 DIAGNOSIS — D509 Iron deficiency anemia, unspecified: Secondary | ICD-10-CM | POA: Diagnosis not present

## 2020-02-28 DIAGNOSIS — D509 Iron deficiency anemia, unspecified: Secondary | ICD-10-CM | POA: Diagnosis not present

## 2020-02-28 DIAGNOSIS — N186 End stage renal disease: Secondary | ICD-10-CM | POA: Diagnosis not present

## 2020-02-28 DIAGNOSIS — N2581 Secondary hyperparathyroidism of renal origin: Secondary | ICD-10-CM | POA: Diagnosis not present

## 2020-02-28 DIAGNOSIS — Z992 Dependence on renal dialysis: Secondary | ICD-10-CM | POA: Diagnosis not present

## 2020-02-28 DIAGNOSIS — D631 Anemia in chronic kidney disease: Secondary | ICD-10-CM | POA: Diagnosis not present

## 2020-02-29 DIAGNOSIS — N2581 Secondary hyperparathyroidism of renal origin: Secondary | ICD-10-CM | POA: Diagnosis not present

## 2020-02-29 DIAGNOSIS — Z992 Dependence on renal dialysis: Secondary | ICD-10-CM | POA: Diagnosis not present

## 2020-02-29 DIAGNOSIS — D631 Anemia in chronic kidney disease: Secondary | ICD-10-CM | POA: Diagnosis not present

## 2020-02-29 DIAGNOSIS — N186 End stage renal disease: Secondary | ICD-10-CM | POA: Diagnosis not present

## 2020-02-29 DIAGNOSIS — D509 Iron deficiency anemia, unspecified: Secondary | ICD-10-CM | POA: Diagnosis not present

## 2020-03-01 DIAGNOSIS — N186 End stage renal disease: Secondary | ICD-10-CM | POA: Diagnosis not present

## 2020-03-01 DIAGNOSIS — D509 Iron deficiency anemia, unspecified: Secondary | ICD-10-CM | POA: Diagnosis not present

## 2020-03-01 DIAGNOSIS — Z992 Dependence on renal dialysis: Secondary | ICD-10-CM | POA: Diagnosis not present

## 2020-03-01 DIAGNOSIS — N2581 Secondary hyperparathyroidism of renal origin: Secondary | ICD-10-CM | POA: Diagnosis not present

## 2020-03-01 DIAGNOSIS — D631 Anemia in chronic kidney disease: Secondary | ICD-10-CM | POA: Diagnosis not present

## 2020-03-02 DIAGNOSIS — D509 Iron deficiency anemia, unspecified: Secondary | ICD-10-CM | POA: Diagnosis not present

## 2020-03-02 DIAGNOSIS — Z1331 Encounter for screening for depression: Secondary | ICD-10-CM | POA: Diagnosis not present

## 2020-03-02 DIAGNOSIS — N2581 Secondary hyperparathyroidism of renal origin: Secondary | ICD-10-CM | POA: Diagnosis not present

## 2020-03-02 DIAGNOSIS — E1165 Type 2 diabetes mellitus with hyperglycemia: Secondary | ICD-10-CM | POA: Diagnosis not present

## 2020-03-02 DIAGNOSIS — Z992 Dependence on renal dialysis: Secondary | ICD-10-CM | POA: Diagnosis not present

## 2020-03-02 DIAGNOSIS — D631 Anemia in chronic kidney disease: Secondary | ICD-10-CM | POA: Diagnosis not present

## 2020-03-02 DIAGNOSIS — Z23 Encounter for immunization: Secondary | ICD-10-CM | POA: Diagnosis not present

## 2020-03-02 DIAGNOSIS — Z1389 Encounter for screening for other disorder: Secondary | ICD-10-CM | POA: Diagnosis not present

## 2020-03-02 DIAGNOSIS — N186 End stage renal disease: Secondary | ICD-10-CM | POA: Diagnosis not present

## 2020-03-02 DIAGNOSIS — I10 Essential (primary) hypertension: Secondary | ICD-10-CM | POA: Diagnosis not present

## 2020-03-03 DIAGNOSIS — D631 Anemia in chronic kidney disease: Secondary | ICD-10-CM | POA: Diagnosis not present

## 2020-03-03 DIAGNOSIS — N186 End stage renal disease: Secondary | ICD-10-CM | POA: Diagnosis not present

## 2020-03-03 DIAGNOSIS — D509 Iron deficiency anemia, unspecified: Secondary | ICD-10-CM | POA: Diagnosis not present

## 2020-03-03 DIAGNOSIS — N2581 Secondary hyperparathyroidism of renal origin: Secondary | ICD-10-CM | POA: Diagnosis not present

## 2020-03-03 DIAGNOSIS — Z992 Dependence on renal dialysis: Secondary | ICD-10-CM | POA: Diagnosis not present

## 2020-03-04 DIAGNOSIS — Z992 Dependence on renal dialysis: Secondary | ICD-10-CM | POA: Diagnosis not present

## 2020-03-04 DIAGNOSIS — D509 Iron deficiency anemia, unspecified: Secondary | ICD-10-CM | POA: Diagnosis not present

## 2020-03-04 DIAGNOSIS — D631 Anemia in chronic kidney disease: Secondary | ICD-10-CM | POA: Diagnosis not present

## 2020-03-04 DIAGNOSIS — N186 End stage renal disease: Secondary | ICD-10-CM | POA: Diagnosis not present

## 2020-03-04 DIAGNOSIS — N2581 Secondary hyperparathyroidism of renal origin: Secondary | ICD-10-CM | POA: Diagnosis not present

## 2020-03-05 DIAGNOSIS — Z992 Dependence on renal dialysis: Secondary | ICD-10-CM | POA: Diagnosis not present

## 2020-03-05 DIAGNOSIS — N2581 Secondary hyperparathyroidism of renal origin: Secondary | ICD-10-CM | POA: Diagnosis not present

## 2020-03-05 DIAGNOSIS — N186 End stage renal disease: Secondary | ICD-10-CM | POA: Diagnosis not present

## 2020-03-05 DIAGNOSIS — D631 Anemia in chronic kidney disease: Secondary | ICD-10-CM | POA: Diagnosis not present

## 2020-03-05 DIAGNOSIS — D509 Iron deficiency anemia, unspecified: Secondary | ICD-10-CM | POA: Diagnosis not present

## 2020-03-06 DIAGNOSIS — D631 Anemia in chronic kidney disease: Secondary | ICD-10-CM | POA: Diagnosis not present

## 2020-03-06 DIAGNOSIS — N186 End stage renal disease: Secondary | ICD-10-CM | POA: Diagnosis not present

## 2020-03-06 DIAGNOSIS — Z992 Dependence on renal dialysis: Secondary | ICD-10-CM | POA: Diagnosis not present

## 2020-03-06 DIAGNOSIS — D509 Iron deficiency anemia, unspecified: Secondary | ICD-10-CM | POA: Diagnosis not present

## 2020-03-06 DIAGNOSIS — N2581 Secondary hyperparathyroidism of renal origin: Secondary | ICD-10-CM | POA: Diagnosis not present

## 2020-03-07 DIAGNOSIS — N2581 Secondary hyperparathyroidism of renal origin: Secondary | ICD-10-CM | POA: Diagnosis not present

## 2020-03-07 DIAGNOSIS — N186 End stage renal disease: Secondary | ICD-10-CM | POA: Diagnosis not present

## 2020-03-07 DIAGNOSIS — D509 Iron deficiency anemia, unspecified: Secondary | ICD-10-CM | POA: Diagnosis not present

## 2020-03-07 DIAGNOSIS — D631 Anemia in chronic kidney disease: Secondary | ICD-10-CM | POA: Diagnosis not present

## 2020-03-07 DIAGNOSIS — Z992 Dependence on renal dialysis: Secondary | ICD-10-CM | POA: Diagnosis not present

## 2020-03-08 DIAGNOSIS — D509 Iron deficiency anemia, unspecified: Secondary | ICD-10-CM | POA: Diagnosis not present

## 2020-03-08 DIAGNOSIS — D631 Anemia in chronic kidney disease: Secondary | ICD-10-CM | POA: Diagnosis not present

## 2020-03-08 DIAGNOSIS — Z992 Dependence on renal dialysis: Secondary | ICD-10-CM | POA: Diagnosis not present

## 2020-03-08 DIAGNOSIS — N186 End stage renal disease: Secondary | ICD-10-CM | POA: Diagnosis not present

## 2020-03-08 DIAGNOSIS — N2581 Secondary hyperparathyroidism of renal origin: Secondary | ICD-10-CM | POA: Diagnosis not present

## 2020-03-09 DIAGNOSIS — D509 Iron deficiency anemia, unspecified: Secondary | ICD-10-CM | POA: Diagnosis not present

## 2020-03-09 DIAGNOSIS — N2581 Secondary hyperparathyroidism of renal origin: Secondary | ICD-10-CM | POA: Diagnosis not present

## 2020-03-09 DIAGNOSIS — Z992 Dependence on renal dialysis: Secondary | ICD-10-CM | POA: Diagnosis not present

## 2020-03-09 DIAGNOSIS — D631 Anemia in chronic kidney disease: Secondary | ICD-10-CM | POA: Diagnosis not present

## 2020-03-09 DIAGNOSIS — N186 End stage renal disease: Secondary | ICD-10-CM | POA: Diagnosis not present

## 2020-03-10 DIAGNOSIS — N2581 Secondary hyperparathyroidism of renal origin: Secondary | ICD-10-CM | POA: Diagnosis not present

## 2020-03-10 DIAGNOSIS — D509 Iron deficiency anemia, unspecified: Secondary | ICD-10-CM | POA: Diagnosis not present

## 2020-03-10 DIAGNOSIS — Z992 Dependence on renal dialysis: Secondary | ICD-10-CM | POA: Diagnosis not present

## 2020-03-10 DIAGNOSIS — N186 End stage renal disease: Secondary | ICD-10-CM | POA: Diagnosis not present

## 2020-03-10 DIAGNOSIS — D631 Anemia in chronic kidney disease: Secondary | ICD-10-CM | POA: Diagnosis not present

## 2020-03-11 DIAGNOSIS — D509 Iron deficiency anemia, unspecified: Secondary | ICD-10-CM | POA: Diagnosis not present

## 2020-03-11 DIAGNOSIS — Z992 Dependence on renal dialysis: Secondary | ICD-10-CM | POA: Diagnosis not present

## 2020-03-11 DIAGNOSIS — D631 Anemia in chronic kidney disease: Secondary | ICD-10-CM | POA: Diagnosis not present

## 2020-03-11 DIAGNOSIS — N2581 Secondary hyperparathyroidism of renal origin: Secondary | ICD-10-CM | POA: Diagnosis not present

## 2020-03-11 DIAGNOSIS — N186 End stage renal disease: Secondary | ICD-10-CM | POA: Diagnosis not present

## 2020-03-12 DIAGNOSIS — D509 Iron deficiency anemia, unspecified: Secondary | ICD-10-CM | POA: Diagnosis not present

## 2020-03-12 DIAGNOSIS — N186 End stage renal disease: Secondary | ICD-10-CM | POA: Diagnosis not present

## 2020-03-12 DIAGNOSIS — D631 Anemia in chronic kidney disease: Secondary | ICD-10-CM | POA: Diagnosis not present

## 2020-03-12 DIAGNOSIS — Z992 Dependence on renal dialysis: Secondary | ICD-10-CM | POA: Diagnosis not present

## 2020-03-12 DIAGNOSIS — N2581 Secondary hyperparathyroidism of renal origin: Secondary | ICD-10-CM | POA: Diagnosis not present

## 2020-03-13 DIAGNOSIS — Z992 Dependence on renal dialysis: Secondary | ICD-10-CM | POA: Diagnosis not present

## 2020-03-13 DIAGNOSIS — N186 End stage renal disease: Secondary | ICD-10-CM | POA: Diagnosis not present

## 2020-03-13 DIAGNOSIS — N2581 Secondary hyperparathyroidism of renal origin: Secondary | ICD-10-CM | POA: Diagnosis not present

## 2020-03-13 DIAGNOSIS — D631 Anemia in chronic kidney disease: Secondary | ICD-10-CM | POA: Diagnosis not present

## 2020-03-13 DIAGNOSIS — D509 Iron deficiency anemia, unspecified: Secondary | ICD-10-CM | POA: Diagnosis not present

## 2020-03-14 DIAGNOSIS — N2581 Secondary hyperparathyroidism of renal origin: Secondary | ICD-10-CM | POA: Diagnosis not present

## 2020-03-14 DIAGNOSIS — Z992 Dependence on renal dialysis: Secondary | ICD-10-CM | POA: Diagnosis not present

## 2020-03-14 DIAGNOSIS — D631 Anemia in chronic kidney disease: Secondary | ICD-10-CM | POA: Diagnosis not present

## 2020-03-14 DIAGNOSIS — N186 End stage renal disease: Secondary | ICD-10-CM | POA: Diagnosis not present

## 2020-03-14 DIAGNOSIS — D509 Iron deficiency anemia, unspecified: Secondary | ICD-10-CM | POA: Diagnosis not present

## 2020-03-15 DIAGNOSIS — D509 Iron deficiency anemia, unspecified: Secondary | ICD-10-CM | POA: Diagnosis not present

## 2020-03-15 DIAGNOSIS — N186 End stage renal disease: Secondary | ICD-10-CM | POA: Diagnosis not present

## 2020-03-15 DIAGNOSIS — N2581 Secondary hyperparathyroidism of renal origin: Secondary | ICD-10-CM | POA: Diagnosis not present

## 2020-03-15 DIAGNOSIS — D631 Anemia in chronic kidney disease: Secondary | ICD-10-CM | POA: Diagnosis not present

## 2020-03-15 DIAGNOSIS — Z992 Dependence on renal dialysis: Secondary | ICD-10-CM | POA: Diagnosis not present

## 2020-03-16 DIAGNOSIS — D631 Anemia in chronic kidney disease: Secondary | ICD-10-CM | POA: Diagnosis not present

## 2020-03-16 DIAGNOSIS — N186 End stage renal disease: Secondary | ICD-10-CM | POA: Diagnosis not present

## 2020-03-16 DIAGNOSIS — N2581 Secondary hyperparathyroidism of renal origin: Secondary | ICD-10-CM | POA: Diagnosis not present

## 2020-03-16 DIAGNOSIS — Z992 Dependence on renal dialysis: Secondary | ICD-10-CM | POA: Diagnosis not present

## 2020-03-16 DIAGNOSIS — D509 Iron deficiency anemia, unspecified: Secondary | ICD-10-CM | POA: Diagnosis not present

## 2020-03-17 DIAGNOSIS — N186 End stage renal disease: Secondary | ICD-10-CM | POA: Diagnosis not present

## 2020-03-17 DIAGNOSIS — D631 Anemia in chronic kidney disease: Secondary | ICD-10-CM | POA: Diagnosis not present

## 2020-03-17 DIAGNOSIS — Z992 Dependence on renal dialysis: Secondary | ICD-10-CM | POA: Diagnosis not present

## 2020-03-17 DIAGNOSIS — D509 Iron deficiency anemia, unspecified: Secondary | ICD-10-CM | POA: Diagnosis not present

## 2020-03-18 DIAGNOSIS — Z992 Dependence on renal dialysis: Secondary | ICD-10-CM | POA: Diagnosis not present

## 2020-03-18 DIAGNOSIS — D509 Iron deficiency anemia, unspecified: Secondary | ICD-10-CM | POA: Diagnosis not present

## 2020-03-18 DIAGNOSIS — D631 Anemia in chronic kidney disease: Secondary | ICD-10-CM | POA: Diagnosis not present

## 2020-03-18 DIAGNOSIS — N186 End stage renal disease: Secondary | ICD-10-CM | POA: Diagnosis not present

## 2020-03-19 DIAGNOSIS — N186 End stage renal disease: Secondary | ICD-10-CM | POA: Diagnosis not present

## 2020-03-19 DIAGNOSIS — Z992 Dependence on renal dialysis: Secondary | ICD-10-CM | POA: Diagnosis not present

## 2020-03-19 DIAGNOSIS — D509 Iron deficiency anemia, unspecified: Secondary | ICD-10-CM | POA: Diagnosis not present

## 2020-03-19 DIAGNOSIS — D631 Anemia in chronic kidney disease: Secondary | ICD-10-CM | POA: Diagnosis not present

## 2020-03-20 DIAGNOSIS — D509 Iron deficiency anemia, unspecified: Secondary | ICD-10-CM | POA: Diagnosis not present

## 2020-03-20 DIAGNOSIS — N186 End stage renal disease: Secondary | ICD-10-CM | POA: Diagnosis not present

## 2020-03-20 DIAGNOSIS — Z992 Dependence on renal dialysis: Secondary | ICD-10-CM | POA: Diagnosis not present

## 2020-03-20 DIAGNOSIS — D631 Anemia in chronic kidney disease: Secondary | ICD-10-CM | POA: Diagnosis not present

## 2020-03-21 DIAGNOSIS — D631 Anemia in chronic kidney disease: Secondary | ICD-10-CM | POA: Diagnosis not present

## 2020-03-21 DIAGNOSIS — Z992 Dependence on renal dialysis: Secondary | ICD-10-CM | POA: Diagnosis not present

## 2020-03-21 DIAGNOSIS — N186 End stage renal disease: Secondary | ICD-10-CM | POA: Diagnosis not present

## 2020-03-21 DIAGNOSIS — D509 Iron deficiency anemia, unspecified: Secondary | ICD-10-CM | POA: Diagnosis not present

## 2020-03-22 DIAGNOSIS — D509 Iron deficiency anemia, unspecified: Secondary | ICD-10-CM | POA: Diagnosis not present

## 2020-03-22 DIAGNOSIS — D631 Anemia in chronic kidney disease: Secondary | ICD-10-CM | POA: Diagnosis not present

## 2020-03-22 DIAGNOSIS — Z992 Dependence on renal dialysis: Secondary | ICD-10-CM | POA: Diagnosis not present

## 2020-03-22 DIAGNOSIS — N186 End stage renal disease: Secondary | ICD-10-CM | POA: Diagnosis not present

## 2020-03-23 DIAGNOSIS — D509 Iron deficiency anemia, unspecified: Secondary | ICD-10-CM | POA: Diagnosis not present

## 2020-03-23 DIAGNOSIS — Z992 Dependence on renal dialysis: Secondary | ICD-10-CM | POA: Diagnosis not present

## 2020-03-23 DIAGNOSIS — D631 Anemia in chronic kidney disease: Secondary | ICD-10-CM | POA: Diagnosis not present

## 2020-03-23 DIAGNOSIS — N186 End stage renal disease: Secondary | ICD-10-CM | POA: Diagnosis not present

## 2020-03-24 DIAGNOSIS — D509 Iron deficiency anemia, unspecified: Secondary | ICD-10-CM | POA: Diagnosis not present

## 2020-03-24 DIAGNOSIS — N186 End stage renal disease: Secondary | ICD-10-CM | POA: Diagnosis not present

## 2020-03-24 DIAGNOSIS — Z992 Dependence on renal dialysis: Secondary | ICD-10-CM | POA: Diagnosis not present

## 2020-03-24 DIAGNOSIS — D631 Anemia in chronic kidney disease: Secondary | ICD-10-CM | POA: Diagnosis not present

## 2020-03-25 DIAGNOSIS — D509 Iron deficiency anemia, unspecified: Secondary | ICD-10-CM | POA: Diagnosis not present

## 2020-03-25 DIAGNOSIS — D631 Anemia in chronic kidney disease: Secondary | ICD-10-CM | POA: Diagnosis not present

## 2020-03-25 DIAGNOSIS — Z992 Dependence on renal dialysis: Secondary | ICD-10-CM | POA: Diagnosis not present

## 2020-03-25 DIAGNOSIS — N186 End stage renal disease: Secondary | ICD-10-CM | POA: Diagnosis not present

## 2020-03-26 DIAGNOSIS — D631 Anemia in chronic kidney disease: Secondary | ICD-10-CM | POA: Diagnosis not present

## 2020-03-26 DIAGNOSIS — N186 End stage renal disease: Secondary | ICD-10-CM | POA: Diagnosis not present

## 2020-03-26 DIAGNOSIS — D509 Iron deficiency anemia, unspecified: Secondary | ICD-10-CM | POA: Diagnosis not present

## 2020-03-26 DIAGNOSIS — Z992 Dependence on renal dialysis: Secondary | ICD-10-CM | POA: Diagnosis not present

## 2020-03-27 DIAGNOSIS — D509 Iron deficiency anemia, unspecified: Secondary | ICD-10-CM | POA: Diagnosis not present

## 2020-03-27 DIAGNOSIS — D631 Anemia in chronic kidney disease: Secondary | ICD-10-CM | POA: Diagnosis not present

## 2020-03-27 DIAGNOSIS — Z992 Dependence on renal dialysis: Secondary | ICD-10-CM | POA: Diagnosis not present

## 2020-03-27 DIAGNOSIS — N186 End stage renal disease: Secondary | ICD-10-CM | POA: Diagnosis not present

## 2020-03-28 DIAGNOSIS — N186 End stage renal disease: Secondary | ICD-10-CM | POA: Diagnosis not present

## 2020-03-28 DIAGNOSIS — Z992 Dependence on renal dialysis: Secondary | ICD-10-CM | POA: Diagnosis not present

## 2020-03-28 DIAGNOSIS — D509 Iron deficiency anemia, unspecified: Secondary | ICD-10-CM | POA: Diagnosis not present

## 2020-03-28 DIAGNOSIS — D631 Anemia in chronic kidney disease: Secondary | ICD-10-CM | POA: Diagnosis not present

## 2020-03-29 DIAGNOSIS — Z992 Dependence on renal dialysis: Secondary | ICD-10-CM | POA: Diagnosis not present

## 2020-03-29 DIAGNOSIS — D631 Anemia in chronic kidney disease: Secondary | ICD-10-CM | POA: Diagnosis not present

## 2020-03-29 DIAGNOSIS — D509 Iron deficiency anemia, unspecified: Secondary | ICD-10-CM | POA: Diagnosis not present

## 2020-03-29 DIAGNOSIS — N186 End stage renal disease: Secondary | ICD-10-CM | POA: Diagnosis not present

## 2020-03-30 DIAGNOSIS — N186 End stage renal disease: Secondary | ICD-10-CM | POA: Diagnosis not present

## 2020-03-30 DIAGNOSIS — Z992 Dependence on renal dialysis: Secondary | ICD-10-CM | POA: Diagnosis not present

## 2020-03-30 DIAGNOSIS — D631 Anemia in chronic kidney disease: Secondary | ICD-10-CM | POA: Diagnosis not present

## 2020-03-30 DIAGNOSIS — D509 Iron deficiency anemia, unspecified: Secondary | ICD-10-CM | POA: Diagnosis not present

## 2020-03-31 DIAGNOSIS — D631 Anemia in chronic kidney disease: Secondary | ICD-10-CM | POA: Diagnosis not present

## 2020-03-31 DIAGNOSIS — Z992 Dependence on renal dialysis: Secondary | ICD-10-CM | POA: Diagnosis not present

## 2020-03-31 DIAGNOSIS — N186 End stage renal disease: Secondary | ICD-10-CM | POA: Diagnosis not present

## 2020-03-31 DIAGNOSIS — D509 Iron deficiency anemia, unspecified: Secondary | ICD-10-CM | POA: Diagnosis not present

## 2020-04-01 DIAGNOSIS — D509 Iron deficiency anemia, unspecified: Secondary | ICD-10-CM | POA: Diagnosis not present

## 2020-04-01 DIAGNOSIS — N186 End stage renal disease: Secondary | ICD-10-CM | POA: Diagnosis not present

## 2020-04-01 DIAGNOSIS — D631 Anemia in chronic kidney disease: Secondary | ICD-10-CM | POA: Diagnosis not present

## 2020-04-01 DIAGNOSIS — Z992 Dependence on renal dialysis: Secondary | ICD-10-CM | POA: Diagnosis not present

## 2020-04-02 DIAGNOSIS — N186 End stage renal disease: Secondary | ICD-10-CM | POA: Diagnosis not present

## 2020-04-02 DIAGNOSIS — D509 Iron deficiency anemia, unspecified: Secondary | ICD-10-CM | POA: Diagnosis not present

## 2020-04-02 DIAGNOSIS — Z992 Dependence on renal dialysis: Secondary | ICD-10-CM | POA: Diagnosis not present

## 2020-04-02 DIAGNOSIS — D631 Anemia in chronic kidney disease: Secondary | ICD-10-CM | POA: Diagnosis not present

## 2020-04-03 DIAGNOSIS — D509 Iron deficiency anemia, unspecified: Secondary | ICD-10-CM | POA: Diagnosis not present

## 2020-04-03 DIAGNOSIS — N186 End stage renal disease: Secondary | ICD-10-CM | POA: Diagnosis not present

## 2020-04-03 DIAGNOSIS — D631 Anemia in chronic kidney disease: Secondary | ICD-10-CM | POA: Diagnosis not present

## 2020-04-03 DIAGNOSIS — Z992 Dependence on renal dialysis: Secondary | ICD-10-CM | POA: Diagnosis not present

## 2020-04-04 DIAGNOSIS — N186 End stage renal disease: Secondary | ICD-10-CM | POA: Diagnosis not present

## 2020-04-04 DIAGNOSIS — D509 Iron deficiency anemia, unspecified: Secondary | ICD-10-CM | POA: Diagnosis not present

## 2020-04-04 DIAGNOSIS — Z992 Dependence on renal dialysis: Secondary | ICD-10-CM | POA: Diagnosis not present

## 2020-04-04 DIAGNOSIS — D631 Anemia in chronic kidney disease: Secondary | ICD-10-CM | POA: Diagnosis not present

## 2020-04-05 DIAGNOSIS — Z992 Dependence on renal dialysis: Secondary | ICD-10-CM | POA: Diagnosis not present

## 2020-04-05 DIAGNOSIS — D509 Iron deficiency anemia, unspecified: Secondary | ICD-10-CM | POA: Diagnosis not present

## 2020-04-05 DIAGNOSIS — D631 Anemia in chronic kidney disease: Secondary | ICD-10-CM | POA: Diagnosis not present

## 2020-04-05 DIAGNOSIS — N186 End stage renal disease: Secondary | ICD-10-CM | POA: Diagnosis not present

## 2020-04-06 DIAGNOSIS — B351 Tinea unguium: Secondary | ICD-10-CM | POA: Diagnosis not present

## 2020-04-06 DIAGNOSIS — E1142 Type 2 diabetes mellitus with diabetic polyneuropathy: Secondary | ICD-10-CM | POA: Diagnosis not present

## 2020-04-06 DIAGNOSIS — Z992 Dependence on renal dialysis: Secondary | ICD-10-CM | POA: Diagnosis not present

## 2020-04-06 DIAGNOSIS — N186 End stage renal disease: Secondary | ICD-10-CM | POA: Diagnosis not present

## 2020-04-06 DIAGNOSIS — D631 Anemia in chronic kidney disease: Secondary | ICD-10-CM | POA: Diagnosis not present

## 2020-04-06 DIAGNOSIS — D509 Iron deficiency anemia, unspecified: Secondary | ICD-10-CM | POA: Diagnosis not present

## 2020-04-07 ENCOUNTER — Ambulatory Visit (INDEPENDENT_AMBULATORY_CARE_PROVIDER_SITE_OTHER): Payer: Medicare Other | Admitting: Emergency Medicine

## 2020-04-07 ENCOUNTER — Encounter: Payer: Self-pay | Admitting: Emergency Medicine

## 2020-04-07 ENCOUNTER — Other Ambulatory Visit: Payer: Self-pay

## 2020-04-07 DIAGNOSIS — J301 Allergic rhinitis due to pollen: Secondary | ICD-10-CM

## 2020-04-07 DIAGNOSIS — J4540 Moderate persistent asthma, uncomplicated: Secondary | ICD-10-CM | POA: Diagnosis not present

## 2020-04-07 DIAGNOSIS — Z9989 Dependence on other enabling machines and devices: Secondary | ICD-10-CM

## 2020-04-07 DIAGNOSIS — D509 Iron deficiency anemia, unspecified: Secondary | ICD-10-CM | POA: Diagnosis not present

## 2020-04-07 DIAGNOSIS — D631 Anemia in chronic kidney disease: Secondary | ICD-10-CM | POA: Diagnosis not present

## 2020-04-07 DIAGNOSIS — N186 End stage renal disease: Secondary | ICD-10-CM | POA: Diagnosis not present

## 2020-04-07 DIAGNOSIS — I2510 Atherosclerotic heart disease of native coronary artery without angina pectoris: Secondary | ICD-10-CM | POA: Diagnosis not present

## 2020-04-07 DIAGNOSIS — G4733 Obstructive sleep apnea (adult) (pediatric): Secondary | ICD-10-CM

## 2020-04-07 DIAGNOSIS — Z992 Dependence on renal dialysis: Secondary | ICD-10-CM | POA: Diagnosis not present

## 2020-04-07 DIAGNOSIS — J309 Allergic rhinitis, unspecified: Secondary | ICD-10-CM | POA: Insufficient documentation

## 2020-04-07 MED ORDER — BUDESONIDE-FORMOTEROL FUMARATE 160-4.5 MCG/ACT IN AERO: 2.0000 | INHALATION_SPRAY | Freq: Two times a day (BID) | RESPIRATORY_TRACT | 6 refills | Status: DC

## 2020-04-07 NOTE — Patient Instructions (Signed)
We will change back to Symbicort.  Use 2 puffs twice a day.  Rinse and gargle after using.  This is a maintenance medication, use it every day on a schedule. We will prescribe albuterol inhaler.  This is your symptom driven medication.  Use 2 puffs if you need it for shortness of breath, chest tightness, wheezing, difficulty clearing your mucus. Congratulations on getting back on your CPAP.  Our goal is for you to be able to use it every single night.  Work hard on this. Follow with Dr Lamonte Sakai in 6 months or sooner if you have any problems

## 2020-04-07 NOTE — Addendum Note (Signed)
Addended by: Gavin Potters R on: 04/07/2020 12:32 PM   Modules accepted: Orders

## 2020-04-07 NOTE — Assessment & Plan Note (Signed)
Using cetirizine as needed, encouraged him to start taking every day during the fall months.  If he continues to have congestion, sinus symptoms then he will start back on Nasacort.

## 2020-04-07 NOTE — Assessment & Plan Note (Signed)
He is working on compliance, still not using every night.  He does feel clinical benefit.  Encouraged him to use reliably.

## 2020-04-07 NOTE — Assessment & Plan Note (Signed)
His biggest complaint is difficulty clearing secretions, cough, increased allergy symptoms with the cooler weather.  No overt wheezing or dyspnea.  He states that has been treated with prednisone for this in the past.  He is not currently on a bronchodilator.  Based on his experience with the Stiolto I think we should change back to Symbicort twice daily.  Also needs albuterol to use as needed.  Will attempt to treat his rhinitis more aggressively.

## 2020-04-07 NOTE — Progress Notes (Signed)
Subjective:    Patient ID: [name] [name], male    DOB: 08/14/1950, 69 y.o.   MRN: [id number]  HPI 69 year old obese former smoker (25 pack years), history of CAD, CKD 5 on PD (from HD about 1 yr ago), diabetes, hypertension, GERD with a hiatal hernia, OSA (poor compliance w CPAP).  He carries a history of asthma that was made following chemical exposure on 9/11. Has been managed with Symbicort, albuterol nebs which he uses about 3-4x a month.  He was started on O2 w exertion over 10 yrs ago, wears it prn.   Has nasal pillows, only wear his mask rarely - hasn't used in a month. Irritates his face. Reports that his day-to-day breathing remains very difficult. He hears wheeze at night when supine, also w exertion. He is able to walk 150 ft then has to rest. Difficult to shop. Has cough every morning - yellow thick mucous, never hemoptysis.   Unsure when his last PFT have been done.  CT abdomen 09/24/19 reviewed by me, shows that the lung bases are clear without any pleural effusion.  ROV 01/05/20 --follow-up visit for 69 year old gentleman on peritoneal dialysis, with diabetes, hypertension, GERD, hiatal hernia, OSA and COPD/asthma.  At his last visit I did trial changing Symbicort to Stiolto to see if he would get more benefit.  He reports that his breathing better - started using the stiolto but was only taking prn, not every day. Hardly ever needs albuterol. He has not retried his CPAP - he has used many different masks, has nasal pillows.  He has been having some relative hypotension, recently stopped his hydralazine.  Was decreased to 5 mg daily.  Remains on peritoneal dialysis Underwent pulmonary function testing/14/21 which I have reviewed, show mixed obstruction and restriction with an FEV1 of 3 6 L (57% predicted), normal (pseudonormal) lung volumes, normal diffusion capacity.  MDM: Pulmonary function testing from 6/14 Cardiology notes Dr. Debara Pickett 11/14/2019  ROV 04/07/20 --68 year old man  with diabetes, hypertension, end-stage renal disease on peritoneal dialysis, OSA. He has COPD/asthma confirmed on pulmonary function testing. At our last visit we talked about retrying Stiolto to see if he would get benefit. Also discussed going back on his CPAP - he has tried to use, is averaging 3-4x a week. Believes that it is helping, believes that he has more energy. Today he reports that he tried Stiolto, used the samples but never filled it. He isn't sure that it helped him. He is having some sinus congestion, HA that have increased as the weather has gotten cooler. Has tried ceterizine prn, has nasonex but does not use. He does not have any albuterol.    Review of Systems  Constitutional: Negative for fever and unexpected weight change.  HENT: Positive for congestion and sneezing. Negative for dental problem, ear pain, nosebleeds, postnasal drip, rhinorrhea, sinus pressure, sore throat and trouble swallowing.   Eyes: Negative for redness and itching.  Respiratory: Positive for cough and shortness of breath. Negative for chest tightness and wheezing.   Cardiovascular: Negative for palpitations and leg swelling.  Gastrointestinal: Negative for nausea and vomiting.  Genitourinary: Negative for dysuria.  Musculoskeletal: Negative for joint swelling.  Skin: Negative for rash.  Allergic/Immunologic: Negative.  Negative for environmental allergies, food allergies and immunocompromised state.  Neurological: Negative for headaches.  Hematological: Does not bruise/bleed easily.  Psychiatric/Behavioral: Negative for dysphoric mood. The patient is not nervous/anxious.        Objective:   Physical Exam Vitals:  04/07/20 1108  BP: 130/70  Pulse: 75  Temp: 98.4 F (36.9 C)  TempSrc: Temporal  SpO2: 97%  Weight: 237 lb 12.8 oz (107.9 kg)  Height: 5\' 4"  (1.626 m)    Gen: Pleasant, obese man, in no distress,  normal affect  ENT: No lesions,  mouth clear,  oropharynx clear, mild erythema,  no postnasal drip  Neck: No JVD, no stridor on a normal breath, no upper airway noise  Lungs: No use of accessory muscles, no crackles or wheezing on normal respiration, no wheeze on forced expiration  Cardiovascular: RRR, heart sounds normal, no murmur or gallops, no peripheral edema  Musculoskeletal: No deformities, no cyanosis or clubbing  Neuro: alert, awake, non focal  Skin: Warm, no lesions or rash      Assessment & Plan:  Asthma His biggest complaint is difficulty clearing secretions, cough, increased allergy symptoms with the cooler weather.  No overt wheezing or dyspnea.  He states that has been treated with prednisone for this in the past.  He is not currently on a bronchodilator.  Based on his experience with the Stiolto I think we should change back to Symbicort twice daily.  Also needs albuterol to use as needed.  Will attempt to treat his rhinitis more aggressively.  OSA on CPAP He is working on compliance, still not using every night.  He does feel clinical benefit.  Encouraged him to use reliably.  Allergic rhinitis Using cetirizine as needed, encouraged him to start taking every day during the fall months.  If he continues to have congestion, sinus symptoms then he will start back on Nasacort.   Baltazar Apo, MD, PhD 04/07/2020, 11:25 AM [facility] [phone] or if no answer [phone]

## 2020-04-08 DIAGNOSIS — D631 Anemia in chronic kidney disease: Secondary | ICD-10-CM | POA: Diagnosis not present

## 2020-04-08 DIAGNOSIS — N186 End stage renal disease: Secondary | ICD-10-CM | POA: Diagnosis not present

## 2020-04-08 DIAGNOSIS — Z992 Dependence on renal dialysis: Secondary | ICD-10-CM | POA: Diagnosis not present

## 2020-04-08 DIAGNOSIS — D509 Iron deficiency anemia, unspecified: Secondary | ICD-10-CM | POA: Diagnosis not present

## 2020-04-09 DIAGNOSIS — Z992 Dependence on renal dialysis: Secondary | ICD-10-CM | POA: Diagnosis not present

## 2020-04-09 DIAGNOSIS — D631 Anemia in chronic kidney disease: Secondary | ICD-10-CM | POA: Diagnosis not present

## 2020-04-09 DIAGNOSIS — D509 Iron deficiency anemia, unspecified: Secondary | ICD-10-CM | POA: Diagnosis not present

## 2020-04-09 DIAGNOSIS — N186 End stage renal disease: Secondary | ICD-10-CM | POA: Diagnosis not present

## 2020-04-10 DIAGNOSIS — N186 End stage renal disease: Secondary | ICD-10-CM | POA: Diagnosis not present

## 2020-04-10 DIAGNOSIS — D509 Iron deficiency anemia, unspecified: Secondary | ICD-10-CM | POA: Diagnosis not present

## 2020-04-10 DIAGNOSIS — Z992 Dependence on renal dialysis: Secondary | ICD-10-CM | POA: Diagnosis not present

## 2020-04-10 DIAGNOSIS — D631 Anemia in chronic kidney disease: Secondary | ICD-10-CM | POA: Diagnosis not present

## 2020-04-11 DIAGNOSIS — D509 Iron deficiency anemia, unspecified: Secondary | ICD-10-CM | POA: Diagnosis not present

## 2020-04-11 DIAGNOSIS — Z992 Dependence on renal dialysis: Secondary | ICD-10-CM | POA: Diagnosis not present

## 2020-04-11 DIAGNOSIS — D631 Anemia in chronic kidney disease: Secondary | ICD-10-CM | POA: Diagnosis not present

## 2020-04-11 DIAGNOSIS — N186 End stage renal disease: Secondary | ICD-10-CM | POA: Diagnosis not present

## 2020-04-12 DIAGNOSIS — Z992 Dependence on renal dialysis: Secondary | ICD-10-CM | POA: Diagnosis not present

## 2020-04-12 DIAGNOSIS — D631 Anemia in chronic kidney disease: Secondary | ICD-10-CM | POA: Diagnosis not present

## 2020-04-12 DIAGNOSIS — N186 End stage renal disease: Secondary | ICD-10-CM | POA: Diagnosis not present

## 2020-04-12 DIAGNOSIS — D509 Iron deficiency anemia, unspecified: Secondary | ICD-10-CM | POA: Diagnosis not present

## 2020-04-13 DIAGNOSIS — N186 End stage renal disease: Secondary | ICD-10-CM | POA: Diagnosis not present

## 2020-04-13 DIAGNOSIS — D509 Iron deficiency anemia, unspecified: Secondary | ICD-10-CM | POA: Diagnosis not present

## 2020-04-13 DIAGNOSIS — Z992 Dependence on renal dialysis: Secondary | ICD-10-CM | POA: Diagnosis not present

## 2020-04-13 DIAGNOSIS — D631 Anemia in chronic kidney disease: Secondary | ICD-10-CM | POA: Diagnosis not present

## 2020-04-14 DIAGNOSIS — Z992 Dependence on renal dialysis: Secondary | ICD-10-CM | POA: Diagnosis not present

## 2020-04-14 DIAGNOSIS — N186 End stage renal disease: Secondary | ICD-10-CM | POA: Diagnosis not present

## 2020-04-14 DIAGNOSIS — D509 Iron deficiency anemia, unspecified: Secondary | ICD-10-CM | POA: Diagnosis not present

## 2020-04-14 DIAGNOSIS — D631 Anemia in chronic kidney disease: Secondary | ICD-10-CM | POA: Diagnosis not present

## 2020-04-15 DIAGNOSIS — N186 End stage renal disease: Secondary | ICD-10-CM | POA: Diagnosis not present

## 2020-04-15 DIAGNOSIS — D631 Anemia in chronic kidney disease: Secondary | ICD-10-CM | POA: Diagnosis not present

## 2020-04-15 DIAGNOSIS — Z992 Dependence on renal dialysis: Secondary | ICD-10-CM | POA: Diagnosis not present

## 2020-04-15 DIAGNOSIS — D509 Iron deficiency anemia, unspecified: Secondary | ICD-10-CM | POA: Diagnosis not present

## 2020-04-16 DIAGNOSIS — D631 Anemia in chronic kidney disease: Secondary | ICD-10-CM | POA: Diagnosis not present

## 2020-04-16 DIAGNOSIS — D509 Iron deficiency anemia, unspecified: Secondary | ICD-10-CM | POA: Diagnosis not present

## 2020-04-16 DIAGNOSIS — Z23 Encounter for immunization: Secondary | ICD-10-CM | POA: Diagnosis not present

## 2020-04-16 DIAGNOSIS — N186 End stage renal disease: Secondary | ICD-10-CM | POA: Diagnosis not present

## 2020-04-16 DIAGNOSIS — N2581 Secondary hyperparathyroidism of renal origin: Secondary | ICD-10-CM | POA: Diagnosis not present

## 2020-04-16 DIAGNOSIS — Z992 Dependence on renal dialysis: Secondary | ICD-10-CM | POA: Diagnosis not present

## 2020-04-17 DIAGNOSIS — D509 Iron deficiency anemia, unspecified: Secondary | ICD-10-CM | POA: Diagnosis not present

## 2020-04-17 DIAGNOSIS — N186 End stage renal disease: Secondary | ICD-10-CM | POA: Diagnosis not present

## 2020-04-17 DIAGNOSIS — Z992 Dependence on renal dialysis: Secondary | ICD-10-CM | POA: Diagnosis not present

## 2020-04-17 DIAGNOSIS — Z23 Encounter for immunization: Secondary | ICD-10-CM | POA: Diagnosis not present

## 2020-04-17 DIAGNOSIS — N2581 Secondary hyperparathyroidism of renal origin: Secondary | ICD-10-CM | POA: Diagnosis not present

## 2020-04-17 DIAGNOSIS — D631 Anemia in chronic kidney disease: Secondary | ICD-10-CM | POA: Diagnosis not present

## 2020-04-18 DIAGNOSIS — N186 End stage renal disease: Secondary | ICD-10-CM | POA: Diagnosis not present

## 2020-04-18 DIAGNOSIS — Z23 Encounter for immunization: Secondary | ICD-10-CM | POA: Diagnosis not present

## 2020-04-18 DIAGNOSIS — D509 Iron deficiency anemia, unspecified: Secondary | ICD-10-CM | POA: Diagnosis not present

## 2020-04-18 DIAGNOSIS — D631 Anemia in chronic kidney disease: Secondary | ICD-10-CM | POA: Diagnosis not present

## 2020-04-18 DIAGNOSIS — Z992 Dependence on renal dialysis: Secondary | ICD-10-CM | POA: Diagnosis not present

## 2020-04-18 DIAGNOSIS — N2581 Secondary hyperparathyroidism of renal origin: Secondary | ICD-10-CM | POA: Diagnosis not present

## 2020-04-19 DIAGNOSIS — N2581 Secondary hyperparathyroidism of renal origin: Secondary | ICD-10-CM | POA: Diagnosis not present

## 2020-04-19 DIAGNOSIS — D631 Anemia in chronic kidney disease: Secondary | ICD-10-CM | POA: Diagnosis not present

## 2020-04-19 DIAGNOSIS — Z992 Dependence on renal dialysis: Secondary | ICD-10-CM | POA: Diagnosis not present

## 2020-04-19 DIAGNOSIS — Z23 Encounter for immunization: Secondary | ICD-10-CM | POA: Diagnosis not present

## 2020-04-19 DIAGNOSIS — N186 End stage renal disease: Secondary | ICD-10-CM | POA: Diagnosis not present

## 2020-04-19 DIAGNOSIS — D509 Iron deficiency anemia, unspecified: Secondary | ICD-10-CM | POA: Diagnosis not present

## 2020-04-20 DIAGNOSIS — D631 Anemia in chronic kidney disease: Secondary | ICD-10-CM | POA: Diagnosis not present

## 2020-04-20 DIAGNOSIS — Z992 Dependence on renal dialysis: Secondary | ICD-10-CM | POA: Diagnosis not present

## 2020-04-20 DIAGNOSIS — N2581 Secondary hyperparathyroidism of renal origin: Secondary | ICD-10-CM | POA: Diagnosis not present

## 2020-04-20 DIAGNOSIS — N186 End stage renal disease: Secondary | ICD-10-CM | POA: Diagnosis not present

## 2020-04-20 DIAGNOSIS — Z23 Encounter for immunization: Secondary | ICD-10-CM | POA: Diagnosis not present

## 2020-04-20 DIAGNOSIS — D509 Iron deficiency anemia, unspecified: Secondary | ICD-10-CM | POA: Diagnosis not present

## 2020-04-21 DIAGNOSIS — D631 Anemia in chronic kidney disease: Secondary | ICD-10-CM | POA: Diagnosis not present

## 2020-04-21 DIAGNOSIS — N2581 Secondary hyperparathyroidism of renal origin: Secondary | ICD-10-CM | POA: Diagnosis not present

## 2020-04-21 DIAGNOSIS — Z23 Encounter for immunization: Secondary | ICD-10-CM | POA: Diagnosis not present

## 2020-04-21 DIAGNOSIS — Z992 Dependence on renal dialysis: Secondary | ICD-10-CM | POA: Diagnosis not present

## 2020-04-21 DIAGNOSIS — D509 Iron deficiency anemia, unspecified: Secondary | ICD-10-CM | POA: Diagnosis not present

## 2020-04-21 DIAGNOSIS — N186 End stage renal disease: Secondary | ICD-10-CM | POA: Diagnosis not present

## 2020-04-22 DIAGNOSIS — E119 Type 2 diabetes mellitus without complications: Secondary | ICD-10-CM | POA: Diagnosis not present

## 2020-04-22 DIAGNOSIS — D509 Iron deficiency anemia, unspecified: Secondary | ICD-10-CM | POA: Diagnosis not present

## 2020-04-22 DIAGNOSIS — Z992 Dependence on renal dialysis: Secondary | ICD-10-CM | POA: Diagnosis not present

## 2020-04-22 DIAGNOSIS — Z23 Encounter for immunization: Secondary | ICD-10-CM | POA: Diagnosis not present

## 2020-04-22 DIAGNOSIS — N2581 Secondary hyperparathyroidism of renal origin: Secondary | ICD-10-CM | POA: Diagnosis not present

## 2020-04-22 DIAGNOSIS — D631 Anemia in chronic kidney disease: Secondary | ICD-10-CM | POA: Diagnosis not present

## 2020-04-22 DIAGNOSIS — N186 End stage renal disease: Secondary | ICD-10-CM | POA: Diagnosis not present

## 2020-04-22 DIAGNOSIS — Z794 Long term (current) use of insulin: Secondary | ICD-10-CM | POA: Diagnosis not present

## 2020-04-23 DIAGNOSIS — Z992 Dependence on renal dialysis: Secondary | ICD-10-CM | POA: Diagnosis not present

## 2020-04-23 DIAGNOSIS — D509 Iron deficiency anemia, unspecified: Secondary | ICD-10-CM | POA: Diagnosis not present

## 2020-04-23 DIAGNOSIS — N186 End stage renal disease: Secondary | ICD-10-CM | POA: Diagnosis not present

## 2020-04-23 DIAGNOSIS — N2581 Secondary hyperparathyroidism of renal origin: Secondary | ICD-10-CM | POA: Diagnosis not present

## 2020-04-23 DIAGNOSIS — Z23 Encounter for immunization: Secondary | ICD-10-CM | POA: Diagnosis not present

## 2020-04-23 DIAGNOSIS — D631 Anemia in chronic kidney disease: Secondary | ICD-10-CM | POA: Diagnosis not present

## 2020-04-24 DIAGNOSIS — N186 End stage renal disease: Secondary | ICD-10-CM | POA: Diagnosis not present

## 2020-04-24 DIAGNOSIS — Z23 Encounter for immunization: Secondary | ICD-10-CM | POA: Diagnosis not present

## 2020-04-24 DIAGNOSIS — Z992 Dependence on renal dialysis: Secondary | ICD-10-CM | POA: Diagnosis not present

## 2020-04-24 DIAGNOSIS — D509 Iron deficiency anemia, unspecified: Secondary | ICD-10-CM | POA: Diagnosis not present

## 2020-04-24 DIAGNOSIS — N2581 Secondary hyperparathyroidism of renal origin: Secondary | ICD-10-CM | POA: Diagnosis not present

## 2020-04-24 DIAGNOSIS — D631 Anemia in chronic kidney disease: Secondary | ICD-10-CM | POA: Diagnosis not present

## 2020-04-25 DIAGNOSIS — Z23 Encounter for immunization: Secondary | ICD-10-CM | POA: Diagnosis not present

## 2020-04-25 DIAGNOSIS — D509 Iron deficiency anemia, unspecified: Secondary | ICD-10-CM | POA: Diagnosis not present

## 2020-04-25 DIAGNOSIS — N186 End stage renal disease: Secondary | ICD-10-CM | POA: Diagnosis not present

## 2020-04-25 DIAGNOSIS — Z992 Dependence on renal dialysis: Secondary | ICD-10-CM | POA: Diagnosis not present

## 2020-04-25 DIAGNOSIS — N2581 Secondary hyperparathyroidism of renal origin: Secondary | ICD-10-CM | POA: Diagnosis not present

## 2020-04-25 DIAGNOSIS — D631 Anemia in chronic kidney disease: Secondary | ICD-10-CM | POA: Diagnosis not present

## 2020-04-26 DIAGNOSIS — Z23 Encounter for immunization: Secondary | ICD-10-CM | POA: Diagnosis not present

## 2020-04-26 DIAGNOSIS — D631 Anemia in chronic kidney disease: Secondary | ICD-10-CM | POA: Diagnosis not present

## 2020-04-26 DIAGNOSIS — Z992 Dependence on renal dialysis: Secondary | ICD-10-CM | POA: Diagnosis not present

## 2020-04-26 DIAGNOSIS — D509 Iron deficiency anemia, unspecified: Secondary | ICD-10-CM | POA: Diagnosis not present

## 2020-04-26 DIAGNOSIS — N2581 Secondary hyperparathyroidism of renal origin: Secondary | ICD-10-CM | POA: Diagnosis not present

## 2020-04-26 DIAGNOSIS — N186 End stage renal disease: Secondary | ICD-10-CM | POA: Diagnosis not present

## 2020-04-27 DIAGNOSIS — D631 Anemia in chronic kidney disease: Secondary | ICD-10-CM | POA: Diagnosis not present

## 2020-04-27 DIAGNOSIS — Z23 Encounter for immunization: Secondary | ICD-10-CM | POA: Diagnosis not present

## 2020-04-27 DIAGNOSIS — D509 Iron deficiency anemia, unspecified: Secondary | ICD-10-CM | POA: Diagnosis not present

## 2020-04-27 DIAGNOSIS — N186 End stage renal disease: Secondary | ICD-10-CM | POA: Diagnosis not present

## 2020-04-27 DIAGNOSIS — N2581 Secondary hyperparathyroidism of renal origin: Secondary | ICD-10-CM | POA: Diagnosis not present

## 2020-04-27 DIAGNOSIS — Z992 Dependence on renal dialysis: Secondary | ICD-10-CM | POA: Diagnosis not present

## 2020-04-28 DIAGNOSIS — D631 Anemia in chronic kidney disease: Secondary | ICD-10-CM | POA: Diagnosis not present

## 2020-04-28 DIAGNOSIS — Z992 Dependence on renal dialysis: Secondary | ICD-10-CM | POA: Diagnosis not present

## 2020-04-28 DIAGNOSIS — N2581 Secondary hyperparathyroidism of renal origin: Secondary | ICD-10-CM | POA: Diagnosis not present

## 2020-04-28 DIAGNOSIS — N186 End stage renal disease: Secondary | ICD-10-CM | POA: Diagnosis not present

## 2020-04-28 DIAGNOSIS — Z23 Encounter for immunization: Secondary | ICD-10-CM | POA: Diagnosis not present

## 2020-04-28 DIAGNOSIS — D509 Iron deficiency anemia, unspecified: Secondary | ICD-10-CM | POA: Diagnosis not present

## 2020-04-29 DIAGNOSIS — N186 End stage renal disease: Secondary | ICD-10-CM | POA: Diagnosis not present

## 2020-04-29 DIAGNOSIS — D509 Iron deficiency anemia, unspecified: Secondary | ICD-10-CM | POA: Diagnosis not present

## 2020-04-29 DIAGNOSIS — Z23 Encounter for immunization: Secondary | ICD-10-CM | POA: Diagnosis not present

## 2020-04-29 DIAGNOSIS — Z992 Dependence on renal dialysis: Secondary | ICD-10-CM | POA: Diagnosis not present

## 2020-04-29 DIAGNOSIS — N2581 Secondary hyperparathyroidism of renal origin: Secondary | ICD-10-CM | POA: Diagnosis not present

## 2020-04-29 DIAGNOSIS — D631 Anemia in chronic kidney disease: Secondary | ICD-10-CM | POA: Diagnosis not present

## 2020-04-30 DIAGNOSIS — D509 Iron deficiency anemia, unspecified: Secondary | ICD-10-CM | POA: Diagnosis not present

## 2020-04-30 DIAGNOSIS — N186 End stage renal disease: Secondary | ICD-10-CM | POA: Diagnosis not present

## 2020-04-30 DIAGNOSIS — D631 Anemia in chronic kidney disease: Secondary | ICD-10-CM | POA: Diagnosis not present

## 2020-04-30 DIAGNOSIS — Z23 Encounter for immunization: Secondary | ICD-10-CM | POA: Diagnosis not present

## 2020-04-30 DIAGNOSIS — N2581 Secondary hyperparathyroidism of renal origin: Secondary | ICD-10-CM | POA: Diagnosis not present

## 2020-04-30 DIAGNOSIS — Z992 Dependence on renal dialysis: Secondary | ICD-10-CM | POA: Diagnosis not present

## 2020-05-01 DIAGNOSIS — D631 Anemia in chronic kidney disease: Secondary | ICD-10-CM | POA: Diagnosis not present

## 2020-05-01 DIAGNOSIS — Z992 Dependence on renal dialysis: Secondary | ICD-10-CM | POA: Diagnosis not present

## 2020-05-01 DIAGNOSIS — N2581 Secondary hyperparathyroidism of renal origin: Secondary | ICD-10-CM | POA: Diagnosis not present

## 2020-05-01 DIAGNOSIS — Z23 Encounter for immunization: Secondary | ICD-10-CM | POA: Diagnosis not present

## 2020-05-01 DIAGNOSIS — N186 End stage renal disease: Secondary | ICD-10-CM | POA: Diagnosis not present

## 2020-05-01 DIAGNOSIS — D509 Iron deficiency anemia, unspecified: Secondary | ICD-10-CM | POA: Diagnosis not present

## 2020-05-02 DIAGNOSIS — N2581 Secondary hyperparathyroidism of renal origin: Secondary | ICD-10-CM | POA: Diagnosis not present

## 2020-05-02 DIAGNOSIS — Z992 Dependence on renal dialysis: Secondary | ICD-10-CM | POA: Diagnosis not present

## 2020-05-02 DIAGNOSIS — N186 End stage renal disease: Secondary | ICD-10-CM | POA: Diagnosis not present

## 2020-05-02 DIAGNOSIS — Z23 Encounter for immunization: Secondary | ICD-10-CM | POA: Diagnosis not present

## 2020-05-02 DIAGNOSIS — D509 Iron deficiency anemia, unspecified: Secondary | ICD-10-CM | POA: Diagnosis not present

## 2020-05-02 DIAGNOSIS — D631 Anemia in chronic kidney disease: Secondary | ICD-10-CM | POA: Diagnosis not present

## 2020-05-03 DIAGNOSIS — Z23 Encounter for immunization: Secondary | ICD-10-CM | POA: Diagnosis not present

## 2020-05-03 DIAGNOSIS — N186 End stage renal disease: Secondary | ICD-10-CM | POA: Diagnosis not present

## 2020-05-03 DIAGNOSIS — D509 Iron deficiency anemia, unspecified: Secondary | ICD-10-CM | POA: Diagnosis not present

## 2020-05-03 DIAGNOSIS — Z992 Dependence on renal dialysis: Secondary | ICD-10-CM | POA: Diagnosis not present

## 2020-05-03 DIAGNOSIS — N2581 Secondary hyperparathyroidism of renal origin: Secondary | ICD-10-CM | POA: Diagnosis not present

## 2020-05-03 DIAGNOSIS — D631 Anemia in chronic kidney disease: Secondary | ICD-10-CM | POA: Diagnosis not present

## 2020-05-04 DIAGNOSIS — D509 Iron deficiency anemia, unspecified: Secondary | ICD-10-CM | POA: Diagnosis not present

## 2020-05-04 DIAGNOSIS — Z992 Dependence on renal dialysis: Secondary | ICD-10-CM | POA: Diagnosis not present

## 2020-05-04 DIAGNOSIS — D631 Anemia in chronic kidney disease: Secondary | ICD-10-CM | POA: Diagnosis not present

## 2020-05-04 DIAGNOSIS — N186 End stage renal disease: Secondary | ICD-10-CM | POA: Diagnosis not present

## 2020-05-04 DIAGNOSIS — Z23 Encounter for immunization: Secondary | ICD-10-CM | POA: Diagnosis not present

## 2020-05-04 DIAGNOSIS — N2581 Secondary hyperparathyroidism of renal origin: Secondary | ICD-10-CM | POA: Diagnosis not present

## 2020-05-05 ENCOUNTER — Other Ambulatory Visit: Payer: Self-pay | Admitting: "Endocrinology

## 2020-05-05 DIAGNOSIS — N186 End stage renal disease: Secondary | ICD-10-CM | POA: Diagnosis not present

## 2020-05-05 DIAGNOSIS — N2581 Secondary hyperparathyroidism of renal origin: Secondary | ICD-10-CM | POA: Diagnosis not present

## 2020-05-05 DIAGNOSIS — Z992 Dependence on renal dialysis: Secondary | ICD-10-CM | POA: Diagnosis not present

## 2020-05-05 DIAGNOSIS — Z23 Encounter for immunization: Secondary | ICD-10-CM | POA: Diagnosis not present

## 2020-05-05 DIAGNOSIS — D509 Iron deficiency anemia, unspecified: Secondary | ICD-10-CM | POA: Diagnosis not present

## 2020-05-05 DIAGNOSIS — D631 Anemia in chronic kidney disease: Secondary | ICD-10-CM | POA: Diagnosis not present

## 2020-05-06 DIAGNOSIS — N186 End stage renal disease: Secondary | ICD-10-CM | POA: Diagnosis not present

## 2020-05-06 DIAGNOSIS — D631 Anemia in chronic kidney disease: Secondary | ICD-10-CM | POA: Diagnosis not present

## 2020-05-06 DIAGNOSIS — Z992 Dependence on renal dialysis: Secondary | ICD-10-CM | POA: Diagnosis not present

## 2020-05-06 DIAGNOSIS — Z23 Encounter for immunization: Secondary | ICD-10-CM | POA: Diagnosis not present

## 2020-05-06 DIAGNOSIS — D509 Iron deficiency anemia, unspecified: Secondary | ICD-10-CM | POA: Diagnosis not present

## 2020-05-06 DIAGNOSIS — N2581 Secondary hyperparathyroidism of renal origin: Secondary | ICD-10-CM | POA: Diagnosis not present

## 2020-05-07 DIAGNOSIS — D509 Iron deficiency anemia, unspecified: Secondary | ICD-10-CM | POA: Diagnosis not present

## 2020-05-07 DIAGNOSIS — Z23 Encounter for immunization: Secondary | ICD-10-CM | POA: Diagnosis not present

## 2020-05-07 DIAGNOSIS — N186 End stage renal disease: Secondary | ICD-10-CM | POA: Diagnosis not present

## 2020-05-07 DIAGNOSIS — Z992 Dependence on renal dialysis: Secondary | ICD-10-CM | POA: Diagnosis not present

## 2020-05-07 DIAGNOSIS — D631 Anemia in chronic kidney disease: Secondary | ICD-10-CM | POA: Diagnosis not present

## 2020-05-07 DIAGNOSIS — N2581 Secondary hyperparathyroidism of renal origin: Secondary | ICD-10-CM | POA: Diagnosis not present

## 2020-05-08 DIAGNOSIS — D509 Iron deficiency anemia, unspecified: Secondary | ICD-10-CM | POA: Diagnosis not present

## 2020-05-08 DIAGNOSIS — Z23 Encounter for immunization: Secondary | ICD-10-CM | POA: Diagnosis not present

## 2020-05-08 DIAGNOSIS — D631 Anemia in chronic kidney disease: Secondary | ICD-10-CM | POA: Diagnosis not present

## 2020-05-08 DIAGNOSIS — N186 End stage renal disease: Secondary | ICD-10-CM | POA: Diagnosis not present

## 2020-05-08 DIAGNOSIS — N2581 Secondary hyperparathyroidism of renal origin: Secondary | ICD-10-CM | POA: Diagnosis not present

## 2020-05-08 DIAGNOSIS — Z992 Dependence on renal dialysis: Secondary | ICD-10-CM | POA: Diagnosis not present

## 2020-05-09 DIAGNOSIS — D509 Iron deficiency anemia, unspecified: Secondary | ICD-10-CM | POA: Diagnosis not present

## 2020-05-09 DIAGNOSIS — N2581 Secondary hyperparathyroidism of renal origin: Secondary | ICD-10-CM | POA: Diagnosis not present

## 2020-05-09 DIAGNOSIS — N186 End stage renal disease: Secondary | ICD-10-CM | POA: Diagnosis not present

## 2020-05-09 DIAGNOSIS — D631 Anemia in chronic kidney disease: Secondary | ICD-10-CM | POA: Diagnosis not present

## 2020-05-09 DIAGNOSIS — Z23 Encounter for immunization: Secondary | ICD-10-CM | POA: Diagnosis not present

## 2020-05-09 DIAGNOSIS — Z992 Dependence on renal dialysis: Secondary | ICD-10-CM | POA: Diagnosis not present

## 2020-05-10 DIAGNOSIS — N186 End stage renal disease: Secondary | ICD-10-CM | POA: Diagnosis not present

## 2020-05-10 DIAGNOSIS — Z992 Dependence on renal dialysis: Secondary | ICD-10-CM | POA: Diagnosis not present

## 2020-05-10 DIAGNOSIS — D509 Iron deficiency anemia, unspecified: Secondary | ICD-10-CM | POA: Diagnosis not present

## 2020-05-10 DIAGNOSIS — Z23 Encounter for immunization: Secondary | ICD-10-CM | POA: Diagnosis not present

## 2020-05-10 DIAGNOSIS — D631 Anemia in chronic kidney disease: Secondary | ICD-10-CM | POA: Diagnosis not present

## 2020-05-10 DIAGNOSIS — N2581 Secondary hyperparathyroidism of renal origin: Secondary | ICD-10-CM | POA: Diagnosis not present

## 2020-05-11 ENCOUNTER — Other Ambulatory Visit: Payer: Self-pay | Admitting: "Endocrinology

## 2020-05-11 DIAGNOSIS — N186 End stage renal disease: Secondary | ICD-10-CM | POA: Diagnosis not present

## 2020-05-11 DIAGNOSIS — Z23 Encounter for immunization: Secondary | ICD-10-CM | POA: Diagnosis not present

## 2020-05-11 DIAGNOSIS — N2581 Secondary hyperparathyroidism of renal origin: Secondary | ICD-10-CM | POA: Diagnosis not present

## 2020-05-11 DIAGNOSIS — E1122 Type 2 diabetes mellitus with diabetic chronic kidney disease: Secondary | ICD-10-CM

## 2020-05-11 DIAGNOSIS — D509 Iron deficiency anemia, unspecified: Secondary | ICD-10-CM | POA: Diagnosis not present

## 2020-05-11 DIAGNOSIS — Z992 Dependence on renal dialysis: Secondary | ICD-10-CM | POA: Diagnosis not present

## 2020-05-11 DIAGNOSIS — D631 Anemia in chronic kidney disease: Secondary | ICD-10-CM | POA: Diagnosis not present

## 2020-05-12 DIAGNOSIS — N186 End stage renal disease: Secondary | ICD-10-CM | POA: Diagnosis not present

## 2020-05-12 DIAGNOSIS — N2581 Secondary hyperparathyroidism of renal origin: Secondary | ICD-10-CM | POA: Diagnosis not present

## 2020-05-12 DIAGNOSIS — D509 Iron deficiency anemia, unspecified: Secondary | ICD-10-CM | POA: Diagnosis not present

## 2020-05-12 DIAGNOSIS — D631 Anemia in chronic kidney disease: Secondary | ICD-10-CM | POA: Diagnosis not present

## 2020-05-12 DIAGNOSIS — Z23 Encounter for immunization: Secondary | ICD-10-CM | POA: Diagnosis not present

## 2020-05-12 DIAGNOSIS — Z992 Dependence on renal dialysis: Secondary | ICD-10-CM | POA: Diagnosis not present

## 2020-05-13 DIAGNOSIS — Z23 Encounter for immunization: Secondary | ICD-10-CM | POA: Diagnosis not present

## 2020-05-13 DIAGNOSIS — D509 Iron deficiency anemia, unspecified: Secondary | ICD-10-CM | POA: Diagnosis not present

## 2020-05-13 DIAGNOSIS — D631 Anemia in chronic kidney disease: Secondary | ICD-10-CM | POA: Diagnosis not present

## 2020-05-13 DIAGNOSIS — N2581 Secondary hyperparathyroidism of renal origin: Secondary | ICD-10-CM | POA: Diagnosis not present

## 2020-05-13 DIAGNOSIS — Z992 Dependence on renal dialysis: Secondary | ICD-10-CM | POA: Diagnosis not present

## 2020-05-13 DIAGNOSIS — N186 End stage renal disease: Secondary | ICD-10-CM | POA: Diagnosis not present

## 2020-05-14 DIAGNOSIS — N186 End stage renal disease: Secondary | ICD-10-CM | POA: Diagnosis not present

## 2020-05-14 DIAGNOSIS — N2581 Secondary hyperparathyroidism of renal origin: Secondary | ICD-10-CM | POA: Diagnosis not present

## 2020-05-14 DIAGNOSIS — Z992 Dependence on renal dialysis: Secondary | ICD-10-CM | POA: Diagnosis not present

## 2020-05-14 DIAGNOSIS — D631 Anemia in chronic kidney disease: Secondary | ICD-10-CM | POA: Diagnosis not present

## 2020-05-14 DIAGNOSIS — D509 Iron deficiency anemia, unspecified: Secondary | ICD-10-CM | POA: Diagnosis not present

## 2020-05-14 DIAGNOSIS — Z23 Encounter for immunization: Secondary | ICD-10-CM | POA: Diagnosis not present

## 2020-05-15 DIAGNOSIS — N186 End stage renal disease: Secondary | ICD-10-CM | POA: Diagnosis not present

## 2020-05-15 DIAGNOSIS — Z992 Dependence on renal dialysis: Secondary | ICD-10-CM | POA: Diagnosis not present

## 2020-05-15 DIAGNOSIS — Z23 Encounter for immunization: Secondary | ICD-10-CM | POA: Diagnosis not present

## 2020-05-15 DIAGNOSIS — D509 Iron deficiency anemia, unspecified: Secondary | ICD-10-CM | POA: Diagnosis not present

## 2020-05-15 DIAGNOSIS — D631 Anemia in chronic kidney disease: Secondary | ICD-10-CM | POA: Diagnosis not present

## 2020-05-15 DIAGNOSIS — N2581 Secondary hyperparathyroidism of renal origin: Secondary | ICD-10-CM | POA: Diagnosis not present

## 2020-05-16 DIAGNOSIS — N2581 Secondary hyperparathyroidism of renal origin: Secondary | ICD-10-CM | POA: Diagnosis not present

## 2020-05-16 DIAGNOSIS — D631 Anemia in chronic kidney disease: Secondary | ICD-10-CM | POA: Diagnosis not present

## 2020-05-16 DIAGNOSIS — Z23 Encounter for immunization: Secondary | ICD-10-CM | POA: Diagnosis not present

## 2020-05-16 DIAGNOSIS — Z992 Dependence on renal dialysis: Secondary | ICD-10-CM | POA: Diagnosis not present

## 2020-05-16 DIAGNOSIS — N186 End stage renal disease: Secondary | ICD-10-CM | POA: Diagnosis not present

## 2020-05-16 DIAGNOSIS — D509 Iron deficiency anemia, unspecified: Secondary | ICD-10-CM | POA: Diagnosis not present

## 2020-05-17 DIAGNOSIS — N186 End stage renal disease: Secondary | ICD-10-CM | POA: Diagnosis not present

## 2020-05-17 DIAGNOSIS — Z992 Dependence on renal dialysis: Secondary | ICD-10-CM | POA: Diagnosis not present

## 2020-05-17 DIAGNOSIS — D509 Iron deficiency anemia, unspecified: Secondary | ICD-10-CM | POA: Diagnosis not present

## 2020-05-17 DIAGNOSIS — D631 Anemia in chronic kidney disease: Secondary | ICD-10-CM | POA: Diagnosis not present

## 2020-05-18 DIAGNOSIS — D631 Anemia in chronic kidney disease: Secondary | ICD-10-CM | POA: Diagnosis not present

## 2020-05-18 DIAGNOSIS — N186 End stage renal disease: Secondary | ICD-10-CM | POA: Diagnosis not present

## 2020-05-18 DIAGNOSIS — Z992 Dependence on renal dialysis: Secondary | ICD-10-CM | POA: Diagnosis not present

## 2020-05-18 DIAGNOSIS — D509 Iron deficiency anemia, unspecified: Secondary | ICD-10-CM | POA: Diagnosis not present

## 2020-05-19 DIAGNOSIS — Z992 Dependence on renal dialysis: Secondary | ICD-10-CM | POA: Diagnosis not present

## 2020-05-19 DIAGNOSIS — D509 Iron deficiency anemia, unspecified: Secondary | ICD-10-CM | POA: Diagnosis not present

## 2020-05-19 DIAGNOSIS — D631 Anemia in chronic kidney disease: Secondary | ICD-10-CM | POA: Diagnosis not present

## 2020-05-19 DIAGNOSIS — N186 End stage renal disease: Secondary | ICD-10-CM | POA: Diagnosis not present

## 2020-05-20 DIAGNOSIS — Z992 Dependence on renal dialysis: Secondary | ICD-10-CM | POA: Diagnosis not present

## 2020-05-20 DIAGNOSIS — Z794 Long term (current) use of insulin: Secondary | ICD-10-CM | POA: Diagnosis not present

## 2020-05-20 DIAGNOSIS — D509 Iron deficiency anemia, unspecified: Secondary | ICD-10-CM | POA: Diagnosis not present

## 2020-05-20 DIAGNOSIS — Z961 Presence of intraocular lens: Secondary | ICD-10-CM | POA: Diagnosis not present

## 2020-05-20 DIAGNOSIS — D631 Anemia in chronic kidney disease: Secondary | ICD-10-CM | POA: Diagnosis not present

## 2020-05-20 DIAGNOSIS — E119 Type 2 diabetes mellitus without complications: Secondary | ICD-10-CM | POA: Diagnosis not present

## 2020-05-20 DIAGNOSIS — N186 End stage renal disease: Secondary | ICD-10-CM | POA: Diagnosis not present

## 2020-05-20 LAB — HM DIABETES EYE EXAM

## 2020-05-21 DIAGNOSIS — Z992 Dependence on renal dialysis: Secondary | ICD-10-CM | POA: Diagnosis not present

## 2020-05-21 DIAGNOSIS — D631 Anemia in chronic kidney disease: Secondary | ICD-10-CM | POA: Diagnosis not present

## 2020-05-21 DIAGNOSIS — D509 Iron deficiency anemia, unspecified: Secondary | ICD-10-CM | POA: Diagnosis not present

## 2020-05-21 DIAGNOSIS — N186 End stage renal disease: Secondary | ICD-10-CM | POA: Diagnosis not present

## 2020-05-22 DIAGNOSIS — D509 Iron deficiency anemia, unspecified: Secondary | ICD-10-CM | POA: Diagnosis not present

## 2020-05-22 DIAGNOSIS — D631 Anemia in chronic kidney disease: Secondary | ICD-10-CM | POA: Diagnosis not present

## 2020-05-22 DIAGNOSIS — Z992 Dependence on renal dialysis: Secondary | ICD-10-CM | POA: Diagnosis not present

## 2020-05-22 DIAGNOSIS — N186 End stage renal disease: Secondary | ICD-10-CM | POA: Diagnosis not present

## 2020-05-23 DIAGNOSIS — D631 Anemia in chronic kidney disease: Secondary | ICD-10-CM | POA: Diagnosis not present

## 2020-05-23 DIAGNOSIS — Z992 Dependence on renal dialysis: Secondary | ICD-10-CM | POA: Diagnosis not present

## 2020-05-23 DIAGNOSIS — D509 Iron deficiency anemia, unspecified: Secondary | ICD-10-CM | POA: Diagnosis not present

## 2020-05-23 DIAGNOSIS — N186 End stage renal disease: Secondary | ICD-10-CM | POA: Diagnosis not present

## 2020-05-24 DIAGNOSIS — Z23 Encounter for immunization: Secondary | ICD-10-CM | POA: Diagnosis not present

## 2020-05-24 DIAGNOSIS — D631 Anemia in chronic kidney disease: Secondary | ICD-10-CM | POA: Diagnosis not present

## 2020-05-24 DIAGNOSIS — N186 End stage renal disease: Secondary | ICD-10-CM | POA: Diagnosis not present

## 2020-05-24 DIAGNOSIS — Z992 Dependence on renal dialysis: Secondary | ICD-10-CM | POA: Diagnosis not present

## 2020-05-24 DIAGNOSIS — D509 Iron deficiency anemia, unspecified: Secondary | ICD-10-CM | POA: Diagnosis not present

## 2020-05-25 DIAGNOSIS — Z992 Dependence on renal dialysis: Secondary | ICD-10-CM | POA: Diagnosis not present

## 2020-05-25 DIAGNOSIS — N186 End stage renal disease: Secondary | ICD-10-CM | POA: Diagnosis not present

## 2020-05-25 DIAGNOSIS — D509 Iron deficiency anemia, unspecified: Secondary | ICD-10-CM | POA: Diagnosis not present

## 2020-05-25 DIAGNOSIS — D631 Anemia in chronic kidney disease: Secondary | ICD-10-CM | POA: Diagnosis not present

## 2020-05-26 DIAGNOSIS — N186 End stage renal disease: Secondary | ICD-10-CM | POA: Diagnosis not present

## 2020-05-26 DIAGNOSIS — D509 Iron deficiency anemia, unspecified: Secondary | ICD-10-CM | POA: Diagnosis not present

## 2020-05-26 DIAGNOSIS — D631 Anemia in chronic kidney disease: Secondary | ICD-10-CM | POA: Diagnosis not present

## 2020-05-26 DIAGNOSIS — Z992 Dependence on renal dialysis: Secondary | ICD-10-CM | POA: Diagnosis not present

## 2020-05-27 DIAGNOSIS — D509 Iron deficiency anemia, unspecified: Secondary | ICD-10-CM | POA: Diagnosis not present

## 2020-05-27 DIAGNOSIS — Z992 Dependence on renal dialysis: Secondary | ICD-10-CM | POA: Diagnosis not present

## 2020-05-27 DIAGNOSIS — N186 End stage renal disease: Secondary | ICD-10-CM | POA: Diagnosis not present

## 2020-05-27 DIAGNOSIS — D631 Anemia in chronic kidney disease: Secondary | ICD-10-CM | POA: Diagnosis not present

## 2020-05-28 DIAGNOSIS — N186 End stage renal disease: Secondary | ICD-10-CM | POA: Diagnosis not present

## 2020-05-28 DIAGNOSIS — D509 Iron deficiency anemia, unspecified: Secondary | ICD-10-CM | POA: Diagnosis not present

## 2020-05-28 DIAGNOSIS — D631 Anemia in chronic kidney disease: Secondary | ICD-10-CM | POA: Diagnosis not present

## 2020-05-28 DIAGNOSIS — Z992 Dependence on renal dialysis: Secondary | ICD-10-CM | POA: Diagnosis not present

## 2020-05-29 DIAGNOSIS — D631 Anemia in chronic kidney disease: Secondary | ICD-10-CM | POA: Diagnosis not present

## 2020-05-29 DIAGNOSIS — D509 Iron deficiency anemia, unspecified: Secondary | ICD-10-CM | POA: Diagnosis not present

## 2020-05-29 DIAGNOSIS — Z992 Dependence on renal dialysis: Secondary | ICD-10-CM | POA: Diagnosis not present

## 2020-05-29 DIAGNOSIS — N186 End stage renal disease: Secondary | ICD-10-CM | POA: Diagnosis not present

## 2020-05-30 DIAGNOSIS — N186 End stage renal disease: Secondary | ICD-10-CM | POA: Diagnosis not present

## 2020-05-30 DIAGNOSIS — Z992 Dependence on renal dialysis: Secondary | ICD-10-CM | POA: Diagnosis not present

## 2020-05-30 DIAGNOSIS — D631 Anemia in chronic kidney disease: Secondary | ICD-10-CM | POA: Diagnosis not present

## 2020-05-30 DIAGNOSIS — D509 Iron deficiency anemia, unspecified: Secondary | ICD-10-CM | POA: Diagnosis not present

## 2020-05-31 DIAGNOSIS — D631 Anemia in chronic kidney disease: Secondary | ICD-10-CM | POA: Diagnosis not present

## 2020-05-31 DIAGNOSIS — Z992 Dependence on renal dialysis: Secondary | ICD-10-CM | POA: Diagnosis not present

## 2020-05-31 DIAGNOSIS — N186 End stage renal disease: Secondary | ICD-10-CM | POA: Diagnosis not present

## 2020-05-31 DIAGNOSIS — D509 Iron deficiency anemia, unspecified: Secondary | ICD-10-CM | POA: Diagnosis not present

## 2020-06-01 ENCOUNTER — Telehealth: Payer: Self-pay | Admitting: Emergency Medicine

## 2020-06-01 DIAGNOSIS — N186 End stage renal disease: Secondary | ICD-10-CM | POA: Diagnosis not present

## 2020-06-01 DIAGNOSIS — D631 Anemia in chronic kidney disease: Secondary | ICD-10-CM | POA: Diagnosis not present

## 2020-06-01 DIAGNOSIS — D509 Iron deficiency anemia, unspecified: Secondary | ICD-10-CM | POA: Diagnosis not present

## 2020-06-01 DIAGNOSIS — Z992 Dependence on renal dialysis: Secondary | ICD-10-CM | POA: Diagnosis not present

## 2020-06-01 NOTE — Telephone Encounter (Signed)
lmtcb for pt.   If machine is over 70 years old we will need an office visit if a new machine is to be ordered. When patient calls back please schedule patient with RB or NP.  RB has openings this week if patient is available.  Thanks!

## 2020-06-02 DIAGNOSIS — D631 Anemia in chronic kidney disease: Secondary | ICD-10-CM | POA: Diagnosis not present

## 2020-06-02 DIAGNOSIS — D509 Iron deficiency anemia, unspecified: Secondary | ICD-10-CM | POA: Diagnosis not present

## 2020-06-02 DIAGNOSIS — N186 End stage renal disease: Secondary | ICD-10-CM | POA: Diagnosis not present

## 2020-06-02 DIAGNOSIS — Z992 Dependence on renal dialysis: Secondary | ICD-10-CM | POA: Diagnosis not present

## 2020-06-03 DIAGNOSIS — N186 End stage renal disease: Secondary | ICD-10-CM | POA: Diagnosis not present

## 2020-06-03 DIAGNOSIS — D631 Anemia in chronic kidney disease: Secondary | ICD-10-CM | POA: Diagnosis not present

## 2020-06-03 DIAGNOSIS — Z992 Dependence on renal dialysis: Secondary | ICD-10-CM | POA: Diagnosis not present

## 2020-06-03 DIAGNOSIS — D509 Iron deficiency anemia, unspecified: Secondary | ICD-10-CM | POA: Diagnosis not present

## 2020-06-04 DIAGNOSIS — N186 End stage renal disease: Secondary | ICD-10-CM | POA: Diagnosis not present

## 2020-06-04 DIAGNOSIS — Z992 Dependence on renal dialysis: Secondary | ICD-10-CM | POA: Diagnosis not present

## 2020-06-04 DIAGNOSIS — D631 Anemia in chronic kidney disease: Secondary | ICD-10-CM | POA: Diagnosis not present

## 2020-06-04 DIAGNOSIS — D509 Iron deficiency anemia, unspecified: Secondary | ICD-10-CM | POA: Diagnosis not present

## 2020-06-04 NOTE — Telephone Encounter (Signed)
lmtcb X2 for pt.  

## 2020-06-05 DIAGNOSIS — N186 End stage renal disease: Secondary | ICD-10-CM | POA: Diagnosis not present

## 2020-06-05 DIAGNOSIS — Z992 Dependence on renal dialysis: Secondary | ICD-10-CM | POA: Diagnosis not present

## 2020-06-05 DIAGNOSIS — D509 Iron deficiency anemia, unspecified: Secondary | ICD-10-CM | POA: Diagnosis not present

## 2020-06-05 DIAGNOSIS — D631 Anemia in chronic kidney disease: Secondary | ICD-10-CM | POA: Diagnosis not present

## 2020-06-06 DIAGNOSIS — Z992 Dependence on renal dialysis: Secondary | ICD-10-CM | POA: Diagnosis not present

## 2020-06-06 DIAGNOSIS — N186 End stage renal disease: Secondary | ICD-10-CM | POA: Diagnosis not present

## 2020-06-06 DIAGNOSIS — D509 Iron deficiency anemia, unspecified: Secondary | ICD-10-CM | POA: Diagnosis not present

## 2020-06-06 DIAGNOSIS — D631 Anemia in chronic kidney disease: Secondary | ICD-10-CM | POA: Diagnosis not present

## 2020-06-07 DIAGNOSIS — D631 Anemia in chronic kidney disease: Secondary | ICD-10-CM | POA: Diagnosis not present

## 2020-06-07 DIAGNOSIS — Z992 Dependence on renal dialysis: Secondary | ICD-10-CM | POA: Diagnosis not present

## 2020-06-07 DIAGNOSIS — N186 End stage renal disease: Secondary | ICD-10-CM | POA: Diagnosis not present

## 2020-06-07 DIAGNOSIS — D509 Iron deficiency anemia, unspecified: Secondary | ICD-10-CM | POA: Diagnosis not present

## 2020-06-07 NOTE — Telephone Encounter (Signed)
Called and spoke to pt. Pt states he is having some burning sensations in his nose while wearing the CPAP. Advised pt that it could be from dryness and could benefit from OTC nasal saline. Pt also aware that he is eligible for a new CPAP (knows it would be on a waitlist) because his CPAP is over 70 years old. Pt agreeable to appt for new CPAP order. Appt made with Derl Barrow, NP, on 11/26. Pt verbalized understanding and denied any further questions or concerns at this time.   Will forward to [facility] as FYI.

## 2020-06-08 DIAGNOSIS — D631 Anemia in chronic kidney disease: Secondary | ICD-10-CM | POA: Diagnosis not present

## 2020-06-08 DIAGNOSIS — Z992 Dependence on renal dialysis: Secondary | ICD-10-CM | POA: Diagnosis not present

## 2020-06-08 DIAGNOSIS — D509 Iron deficiency anemia, unspecified: Secondary | ICD-10-CM | POA: Diagnosis not present

## 2020-06-08 DIAGNOSIS — N186 End stage renal disease: Secondary | ICD-10-CM | POA: Diagnosis not present

## 2020-06-09 DIAGNOSIS — N186 End stage renal disease: Secondary | ICD-10-CM | POA: Diagnosis not present

## 2020-06-09 DIAGNOSIS — D509 Iron deficiency anemia, unspecified: Secondary | ICD-10-CM | POA: Diagnosis not present

## 2020-06-09 DIAGNOSIS — Z992 Dependence on renal dialysis: Secondary | ICD-10-CM | POA: Diagnosis not present

## 2020-06-09 DIAGNOSIS — D631 Anemia in chronic kidney disease: Secondary | ICD-10-CM | POA: Diagnosis not present

## 2020-06-09 NOTE — Progress Notes (Signed)
@Patient  ID: [name] [name], male    DOB: [birth year], 69 y.o.   MRN: [id number]  Chief Complaint  Patient presents with  . Follow-up    Pt is requesting a new CPAP machine due to his current machine being at least 70 years old. Pt states it is also making his nose burn when using.    Referring provider: [name], MD  HPI: 69 year old male, former smoker. PMH significant for OSA on CPAP, moderate persistent asthma, chronic kidney disease on dialysis. Patient of Dr. Lamonte Sakai, last seen on 03/18/20. Maintained on Symbicort, albuterol. Started on oxygen with exertion 10 years ago, wears as needed. No PFTs or sleep study on file. He previously saw Dr. Luan Pulling for sleep apnea.  06/11/2020 Patient presents today for OSA review. Needs new CPAP machine, current one is > 18 years old. He stopped wearing it about 2 weeks ago d/t nasal burning. He uses nasal pillow mask, he thinks his DME company is [employer]. He thinks he gets 4-5 hours of sleep each night. Bedtime is 10-10:30pm. He wakes up several times at night. He gets out of bed in the morning at 9am. Without CPAP he reports loud snoring and fatigue. [place] 10/24.   Allergies  Allergen Reactions  . Tramadol Hcl   . Opana [Oxymorphone Hcl] Itching  . Oxymorphone Itching  . Tramadol Itching    Immunization History  Administered Date(s) Administered  . Influenza, High Dose Seasonal PF 09/12/2018, 04/17/2019, 05/11/2020  . Influenza-Unspecified 09/12/2018, 04/01/2019  . Moderna SARS-COVID-2 Vaccination 09/18/2019, 10/16/2019, 05/24/2020  . PPD Test 07/19/2018, 09/07/2018, 11/18/2019  . Pneumococcal Polysaccharide-23 09/13/2018  . Td 03/27/2011    Past Medical History:  Diagnosis Date  . Anemia   . Arthritis   . Asthma   . Bell palsy   . CAD (coronary artery disease)    a. 2014 MV: abnl w/ infap ischemia; b. 03/2013 Cath: aneurysmal bleb in the LAD w/ otw nonobs dzs-->Med Rx.  . Chronic back pain   . Chronic knee pain    a.  09/2015 s/p R TKA.  Marland Kitchen Chronic pain   . Chronic shoulder pain   . Chronic sinusitis   . CKD (chronic kidney disease), stage IV ([place])    stage 4 per office visit note of Dr Lowanda Foster on 05/2015   . COPD (chronic obstructive pulmonary disease) ([place])   . Diabetes mellitus without complication ([place])    type II   . Essential hypertension   . GERD (gastroesophageal reflux disease)   . Gout   . Gout   . Hepatomegaly    noted on noncontrast CT 2015  . History of hiatal hernia   . Hyperlipidemia   . Lateral meniscus tear   . Obesity    [place]  . Obstructive sleep apnea    does not use cpap   . On home oxygen therapy    uses 2l when is going somewhere per patient   . PUD (peptic ulcer disease)    remote, reports f/u EGD about 8 years ago unremarkable   . Reactive airway disease    related to exposure to chemical during 9/11  . Renal insufficiency   . Sinusitis   . Vitamin D deficiency     Tobacco History: Social History   Tobacco Use  Smoking Status Former Smoker  . Packs/day: 1.00  . Years: 25.00  . Pack years: 25.00  . Types: Cigarettes  . Quit date: 03/27/2010  . Years since quitting: 10.2  Smokeless Tobacco Never Used  Tobacco Comment   Quit x 7 years   Counseling given: Not Answered Comment: Quit x 7 years   Outpatient Medications Prior to Visit  Medication Sig Dispense Refill  . amLODipine (NORVASC) 5 MG tablet Take 1 tablet (5 mg total) by mouth daily. 90 tablet 3  . aspirin EC 81 MG tablet Take 81 mg by mouth daily. Swallow whole.    . budesonide-formoterol (SYMBICORT) 160-4.5 MCG/ACT inhaler Inhale 2 puffs into the lungs 2 (two) times daily. 1 each 6  . calcitRIOL (ROCALTROL) 0.25 MCG capsule Take 0.25 mcg by mouth daily.    . DROPLET PEN NEEDLES 31G X 5 MM MISC INJECT SUBCUTANEOUSLY TWICE DAILY AS DIRECTED 200 each 2  . fluticasone (FLONASE) 50 MCG/ACT nasal spray Place 2 sprays into both nostrils daily. 16 g 0  . insulin glargine (LANTUS SOLOSTAR) 100 UNIT/ML  Solostar Pen Inject 30 Units into the skin at bedtime. 30 mL 1  . losartan (COZAAR) 100 MG tablet     . mometasone (NASONEX) 50 MCG/ACT nasal spray Place 2 sprays into the nose 2 (two) times daily as needed (allergies).     . NOVOLOG FLEXPEN 100 UNIT/ML FlexPen     . pantoprazole (PROTONIX) 40 MG tablet TAKE 1 TABLET TWICE DAILY 30 MINUTES PRIOR TO MEALS 180 tablet 3  . simvastatin (ZOCOR) 20 MG tablet Take 20 mg by mouth every morning.     . TRADJENTA 5 MG TABS tablet TAKE 1 TABLET EVERY DAY 90 tablet 0   No facility-administered medications prior to visit.    Review of Systems  Review of Systems  Constitutional: Positive for fatigue.  Respiratory: Negative for cough, chest tightness, shortness of breath and wheezing.   Psychiatric/Behavioral: Positive for sleep disturbance.   Physical Exam  BP 126/74 (BP Location: Right Arm, Cuff Size: Large)   Pulse 71   Ht 5\' 4"  (1.626 m)   Wt 235 lb 6.4 oz (106.8 kg)   SpO2 97%   BMI 40.41 kg/m  Physical Exam Constitutional:      Appearance: Normal appearance.  HENT:     Head: Normocephalic and atraumatic.     Mouth/Throat:     Mouth: Mucous membranes are moist.     Pharynx: Oropharynx is clear.  Cardiovascular:     Rate and Rhythm: Normal rate and regular rhythm.  Pulmonary:     Effort: Pulmonary effort is normal. No respiratory distress.     Breath sounds: No stridor. No rhonchi or rales.     Comments: Insp wheeze left lung Musculoskeletal:        General: Normal range of motion.  Skin:    General: Skin is warm and dry.  Neurological:     General: No focal deficit present.     Mental Status: He is alert and oriented to person, place, and time. Mental status is at baseline.  Psychiatric:        Mood and Affect: Mood normal.        Behavior: Behavior normal.        Thought Content: Thought content normal.        Judgment: Judgment normal.      Lab Results:  CBC    Component Value Date/Time   WBC 4.6 06/07/2019 0219    RBC 3.71 (L) 06/07/2019 0219   HGB 10.6 (L) 06/07/2019 0219   HCT 35.4 (L) 06/07/2019 0219   PLT 151 06/07/2019 0219   MCV 95.4 06/07/2019 0219   MCH  28.6 06/07/2019 0219   MCHC 29.9 (L) 06/07/2019 0219   RDW 15.3 06/07/2019 0219   LYMPHSABS 0.7 06/07/2019 0219   MONOABS 0.6 06/07/2019 0219   EOSABS 0.3 06/07/2019 0219   BASOSABS 0.0 06/07/2019 0219    BMET    Component Value Date/Time   NA 141 02/09/2020 1126   K 3.9 02/09/2020 1126   CL 100 02/09/2020 1126   CO2 27 02/09/2020 1126   GLUCOSE 109 (H) 02/09/2020 1126   BUN 64 (H) 02/09/2020 1126   BUN 43 (A) 12/19/2016 0000   CREATININE 18.89 (H) 02/09/2020 1126   CALCIUM 10.3 02/09/2020 1126   GFRNONAA 2 (L) 02/09/2020 1126   GFRAA <4 (L) 02/09/2020 1126    BNP No results found for: BNP  ProBNP No results found for: PROBNP  Imaging: No results found.   Assessment & Plan:   OSA on CPAP - Patient stopped using CPAP 2 weeks ago d/t nasal burning. CPAP machine is >27 years old, we do not have a copy of past sleep study. We will call [facility] to see if they have on file, otherwise we will need to repeat study before getting him a new machine - Encouraged patient to continue to work on weight loss efforts, sleep on side, and avoid sedating medication or alcohol in excess prior to bedtime as these can worsen underlying sleep apnea - Advised patient not to drive if experiencing excessive daytime fatigue or somnolence   Asthma - No acute complaints. He is compliant with Symbicort, recently started using twice a day. Insp wheeze left lung on exam. 02 97% on room air. No changes today.    Martyn Ehrich, NP 06/11/2020

## 2020-06-10 DIAGNOSIS — N186 End stage renal disease: Secondary | ICD-10-CM | POA: Diagnosis not present

## 2020-06-10 DIAGNOSIS — D509 Iron deficiency anemia, unspecified: Secondary | ICD-10-CM | POA: Diagnosis not present

## 2020-06-10 DIAGNOSIS — D631 Anemia in chronic kidney disease: Secondary | ICD-10-CM | POA: Diagnosis not present

## 2020-06-10 DIAGNOSIS — Z992 Dependence on renal dialysis: Secondary | ICD-10-CM | POA: Diagnosis not present

## 2020-06-11 ENCOUNTER — Other Ambulatory Visit: Payer: Self-pay

## 2020-06-11 ENCOUNTER — Encounter: Payer: Self-pay | Admitting: Primary Care

## 2020-06-11 ENCOUNTER — Telehealth: Payer: Self-pay | Admitting: Primary Care

## 2020-06-11 ENCOUNTER — Ambulatory Visit (INDEPENDENT_AMBULATORY_CARE_PROVIDER_SITE_OTHER): Payer: Medicare Other | Admitting: Primary Care

## 2020-06-11 DIAGNOSIS — D509 Iron deficiency anemia, unspecified: Secondary | ICD-10-CM | POA: Diagnosis not present

## 2020-06-11 DIAGNOSIS — J4540 Moderate persistent asthma, uncomplicated: Secondary | ICD-10-CM | POA: Diagnosis not present

## 2020-06-11 DIAGNOSIS — Z9989 Dependence on other enabling machines and devices: Secondary | ICD-10-CM | POA: Diagnosis not present

## 2020-06-11 DIAGNOSIS — G4733 Obstructive sleep apnea (adult) (pediatric): Secondary | ICD-10-CM | POA: Diagnosis not present

## 2020-06-11 DIAGNOSIS — Z992 Dependence on renal dialysis: Secondary | ICD-10-CM | POA: Diagnosis not present

## 2020-06-11 DIAGNOSIS — D631 Anemia in chronic kidney disease: Secondary | ICD-10-CM | POA: Diagnosis not present

## 2020-06-11 DIAGNOSIS — N186 End stage renal disease: Secondary | ICD-10-CM | POA: Diagnosis not present

## 2020-06-11 NOTE — Assessment & Plan Note (Signed)
-   No acute complaints. He is compliant with Symbicort, recently started using twice a day. Insp wheeze left lung on exam. 02 97% on room air. No changes today.

## 2020-06-11 NOTE — Telephone Encounter (Signed)
Per Eustaquio Maize, pt thinks DME is [facility] for his CPAP. Attempted to call [facility] 11/26 but office was closed due to holiday. Will need to call Monday 11/29 to see if they are pt's DME for cpap and if so, do they have a copy of his HST.  If [facility] is not pt's DME, pt will need to have a new sleep study performed.

## 2020-06-11 NOTE — Patient Instructions (Signed)
Nice meeting you today [name] [name]  Sleep apnea: Continue to work on weight loss efforts  Sleep on side, avoid sedating medication or alcohol in excess prior to bedtime as these can worsen underlying sleep apnea Do not drive if experiencing excessive daytime fatigue or somnolence   Asthma: Use Symbicort two puff twice daily (rinse mouth after use)  Orders: Please call [facility] and see if they have a copy of his sleep study, if not then we will need to repeat HST re: snoring/ hx sleep apnea  Follow-up: 3 months with Dr. Lamonte Sakai or APP    Sleep Apnea Sleep apnea affects breathing during sleep. It causes breathing to stop for a short time or to become shallow. It can also increase the risk of:  Heart attack.  Stroke.  Being very overweight (obese).  Diabetes.  Heart failure.  Irregular heartbeat. The goal of treatment is to help you breathe normally again. What are the causes? There are three kinds of sleep apnea:  Obstructive sleep apnea. This is caused by a blocked or collapsed airway.  Central sleep apnea. This happens when the brain does not send the right signals to the muscles that control breathing.  Mixed sleep apnea. This is a combination of obstructive and central sleep apnea. The most common cause of this condition is a collapsed or blocked airway. This can happen if:  Your throat muscles are too relaxed.  Your tongue and tonsils are too large.  You are overweight.  Your airway is too small. What increases the risk?  Being overweight.  Smoking.  Having a small airway.  Being older.  Being male.  Drinking alcohol.  Taking medicines to calm yourself (sedatives or tranquilizers).  Having family members with the condition. What are the signs or symptoms?  Trouble staying asleep.  Being sleepy or tired during the day.  Getting angry a lot.  Loud snoring.  Headaches in the morning.  Not being able to focus your mind  (concentrate).  Forgetting things.  Less interest in sex.  Mood swings.  Personality changes.  Feelings of sadness (depression).  Waking up a lot during the night to pee (urinate).  Dry mouth.  Sore throat. How is this diagnosed?  Your medical history.  A physical exam.  A test that is done when you are sleeping (sleep study). The test is most often done in a sleep lab but may also be done at home. How is this treated?   Sleeping on your side.  Using a medicine to get rid of mucus in your nose (decongestant).  Avoiding the use of alcohol, medicines to help you relax, or certain pain medicines (narcotics).  Losing weight, if needed.  Changing your diet.  Not smoking.  Using a machine to open your airway while you sleep, such as: ? An oral appliance. This is a mouthpiece that shifts your lower jaw forward. ? A CPAP device. This device blows air through a mask when you breathe out (exhale). ? An EPAP device. This has valves that you put in each nostril. ? A BPAP device. This device blows air through a mask when you breathe in (inhale) and breathe out.  Having surgery if other treatments do not work. It is important to get treatment for sleep apnea. Without treatment, it can lead to:  High blood pressure.  Coronary artery disease.  In men, not being able to have an erection (impotence).  Reduced thinking ability. Follow these instructions at home: Lifestyle  Make changes that  your doctor recommends.  Eat a healthy diet.  Lose weight if needed.  Avoid alcohol, medicines to help you relax, and some pain medicines.  Do not use any products that contain nicotine or tobacco, such as cigarettes, e-cigarettes, and chewing tobacco. If you need help quitting, ask your doctor. General instructions  Take over-the-counter and prescription medicines only as told by your doctor.  If you were given a machine to use while you sleep, use it only as told by your  doctor.  If you are having surgery, make sure to tell your doctor you have sleep apnea. You may need to bring your device with you.  Keep all follow-up visits as told by your doctor. This is important. Contact a doctor if:  The machine that you were given to use during sleep bothers you or does not seem to be working.  You do not get better.  You get worse. Get help right away if:  Your chest hurts.  You have trouble breathing in enough air.  You have an uncomfortable feeling in your back, arms, or stomach.  You have trouble talking.  One side of your body feels weak.  A part of your face is hanging down. These symptoms may be an emergency. Do not wait to see if the symptoms will go away. Get medical help right away. Call your local emergency services (911 in the [place] Do not drive yourself to the hospital. Summary  This condition affects breathing during sleep.  The most common cause is a collapsed or blocked airway.  The goal of treatment is to help you breathe normally while you sleep. This information is not intended to replace advice given to you by your health care provider. Make sure you discuss any questions you have with your health care provider. Document Revised: 04/19/2018 Document Reviewed: 02/26/2018 Elsevier Patient Education  [place].   Asthma, Adult  Asthma is a long-term (chronic) condition in which the airways get tight and narrow. The airways are the breathing passages that lead from the nose and mouth down into the lungs. A person with asthma will have times when symptoms get worse. These are called asthma attacks. They can cause coughing, whistling sounds when you breathe (wheezing), shortness of breath, and chest pain. They can make it hard to breathe. There is no cure for asthma, but medicines and lifestyle changes can help control it. There are many things that can bring on an asthma attack or make asthma symptoms worse (triggers). Common  triggers include:  Mold.  Dust.  Cigarette smoke.  Cockroaches.  Things that can cause allergy symptoms (allergens). These include animal skin flakes (dander) and pollen from trees or grass.  Things that pollute the air. These may include household cleaners, wood smoke, smog, or chemical odors.  Cold air, weather changes, and wind.  Crying or laughing hard.  Stress.  Certain medicines or drugs.  Certain foods such as dried fruit, potato chips, and grape juice.  Infections, such as a cold or the flu.  Certain medical conditions or diseases.  Exercise or tiring activities. Asthma may be treated with medicines and by staying away from the things that cause asthma attacks. Types of medicines may include:  Controller medicines. These help prevent asthma symptoms. They are usually taken every day.  Fast-acting reliever or rescue medicines. These quickly relieve asthma symptoms. They are used as needed and provide short-term relief.  Allergy medicines if your attacks are brought on by allergens.  Medicines to  help control the body's defense (immune) system. Follow these instructions at home: Avoiding triggers in your home  Change your heating and air conditioning filter often.  Limit your use of fireplaces and wood stoves.  Get rid of pests (such as roaches and mice) and their droppings.  Throw away plants if you see mold on them.  Clean your floors. Dust regularly. Use cleaning products that do not smell.  Have someone vacuum when you are not home. Use a vacuum cleaner with a HEPA filter if possible.  Replace carpet with wood, tile, or vinyl flooring. Carpet can trap animal skin flakes and dust.  Use allergy-proof pillows, mattress covers, and box spring covers.  Wash bed sheets and blankets every week in hot water. Dry them in a dryer.  Keep your bedroom free of any triggers.  Avoid pets and keep windows closed when things that cause allergy symptoms are in the  air.  Use blankets that are made of polyester or cotton.  Clean bathrooms and kitchens with bleach. If possible, have someone repaint the walls in these rooms with mold-resistant paint. Keep out of the rooms that are being cleaned and painted.  Wash your hands often with soap and water. If soap and water are not available, use hand sanitizer.  Do not allow anyone to smoke in your home. General instructions  Take over-the-counter and prescription medicines only as told by your doctor. ? Talk with your doctor if you have questions about how or when to take your medicines. ? Make note if you need to use your medicines more often than usual.  Do not use any products that contain nicotine or tobacco, such as cigarettes and e-cigarettes. If you need help quitting, ask your doctor.  Stay away from secondhand smoke.  Avoid doing things outdoors when allergen counts are high and when air quality is low.  Wear a ski mask when doing outdoor activities in the winter. The mask should cover your nose and mouth. Exercise indoors on cold days if you can.  Warm up before you exercise. Take time to cool down after exercise.  Use a peak flow meter as told by your doctor. A peak flow meter is a tool that measures how well the lungs are working.  Keep track of the peak flow meter's readings. Write them down.  Follow your asthma action plan. This is a written plan for taking care of your asthma and treating your attacks.  Make sure you get all the shots (vaccines) that your doctor recommends. Ask your doctor about a flu shot and a pneumonia shot.  Keep all follow-up visits as told by your doctor. This is important. Contact a doctor if:  You have wheezing, shortness of breath, or a cough even while taking medicine to prevent attacks.  The mucus you cough up (sputum) is thicker than usual.  The mucus you cough up changes from clear or white to yellow, green, gray, or bloody.  You have problems from  the medicine you are taking, such as: ? A rash. ? Itching. ? Swelling. ? Trouble breathing.  You need reliever medicines more than 2-3 times a week.  Your peak flow reading is still at 50-79% of your personal best after following the action plan for 1 hour.  You have a fever. Get help right away if:  You seem to be worse and are not responding to medicine during an asthma attack.  You are short of breath even at rest.  You get short  of breath when doing very little activity.  You have trouble eating, drinking, or talking.  You have chest pain or tightness.  You have a fast heartbeat.  Your lips or fingernails start to turn blue.  You are light-headed or dizzy, or you faint.  Your peak flow is less than 50% of your personal best.  You feel too tired to breathe normally. Summary  Asthma is a long-term (chronic) condition in which the airways get tight and narrow. An asthma attack can make it hard to breathe.  Asthma cannot be cured, but medicines and lifestyle changes can help control it.  Make sure you understand how to avoid triggers and how and when to use your medicines. This information is not intended to replace advice given to you by your health care provider. Make sure you discuss any questions you have with your health care provider. Document Revised: 09/05/2018 Document Reviewed: 08/07/2016 Elsevier Patient Education  2020 [employer].

## 2020-06-11 NOTE — Assessment & Plan Note (Signed)
-   Patient stopped using CPAP 2 weeks ago d/t nasal burning. CPAP machine is >70 years old, we do not have a copy of past sleep study. We will call [facility] to see if they have on file, otherwise we will need to repeat study before getting him a new machine - Encouraged patient to continue to work on weight loss efforts, sleep on side, and avoid sedating medication or alcohol in excess prior to bedtime as these can worsen underlying sleep apnea - Advised patient not to drive if experiencing excessive daytime fatigue or somnolence

## 2020-06-12 DIAGNOSIS — Z992 Dependence on renal dialysis: Secondary | ICD-10-CM | POA: Diagnosis not present

## 2020-06-12 DIAGNOSIS — D509 Iron deficiency anemia, unspecified: Secondary | ICD-10-CM | POA: Diagnosis not present

## 2020-06-12 DIAGNOSIS — D631 Anemia in chronic kidney disease: Secondary | ICD-10-CM | POA: Diagnosis not present

## 2020-06-12 DIAGNOSIS — N186 End stage renal disease: Secondary | ICD-10-CM | POA: Diagnosis not present

## 2020-06-13 DIAGNOSIS — D631 Anemia in chronic kidney disease: Secondary | ICD-10-CM | POA: Diagnosis not present

## 2020-06-13 DIAGNOSIS — D509 Iron deficiency anemia, unspecified: Secondary | ICD-10-CM | POA: Diagnosis not present

## 2020-06-13 DIAGNOSIS — N186 End stage renal disease: Secondary | ICD-10-CM | POA: Diagnosis not present

## 2020-06-13 DIAGNOSIS — Z992 Dependence on renal dialysis: Secondary | ICD-10-CM | POA: Diagnosis not present

## 2020-06-14 DIAGNOSIS — N186 End stage renal disease: Secondary | ICD-10-CM | POA: Diagnosis not present

## 2020-06-14 DIAGNOSIS — D631 Anemia in chronic kidney disease: Secondary | ICD-10-CM | POA: Diagnosis not present

## 2020-06-14 DIAGNOSIS — Z992 Dependence on renal dialysis: Secondary | ICD-10-CM | POA: Diagnosis not present

## 2020-06-14 DIAGNOSIS — D509 Iron deficiency anemia, unspecified: Secondary | ICD-10-CM | POA: Diagnosis not present

## 2020-06-14 NOTE — Telephone Encounter (Signed)
Called [facility] and spoke with Estill Bamberg checking to see if they were pt's DME for his cpap. Per Estill Bamberg, they are not pt's DME.  Stated this to [facility] and she said to order a HST for pt.  Attempted to call pt to let him know this but unable to reach and unable to leave a VM. Will try to call back later.

## 2020-06-15 DIAGNOSIS — Z992 Dependence on renal dialysis: Secondary | ICD-10-CM | POA: Diagnosis not present

## 2020-06-15 DIAGNOSIS — D509 Iron deficiency anemia, unspecified: Secondary | ICD-10-CM | POA: Diagnosis not present

## 2020-06-15 DIAGNOSIS — D631 Anemia in chronic kidney disease: Secondary | ICD-10-CM | POA: Diagnosis not present

## 2020-06-15 DIAGNOSIS — N186 End stage renal disease: Secondary | ICD-10-CM | POA: Diagnosis not present

## 2020-06-16 ENCOUNTER — Other Ambulatory Visit: Payer: Self-pay

## 2020-06-16 ENCOUNTER — Encounter: Payer: Self-pay | Admitting: Nurse Practitioner

## 2020-06-16 ENCOUNTER — Ambulatory Visit (INDEPENDENT_AMBULATORY_CARE_PROVIDER_SITE_OTHER): Payer: Medicare Other | Admitting: Nurse Practitioner

## 2020-06-16 VITALS — BP 129/85 | HR 65 | Ht 64.0 in | Wt 240.0 lb

## 2020-06-16 DIAGNOSIS — Z992 Dependence on renal dialysis: Secondary | ICD-10-CM | POA: Diagnosis not present

## 2020-06-16 DIAGNOSIS — N185 Chronic kidney disease, stage 5: Secondary | ICD-10-CM | POA: Diagnosis not present

## 2020-06-16 DIAGNOSIS — N186 End stage renal disease: Secondary | ICD-10-CM | POA: Diagnosis not present

## 2020-06-16 DIAGNOSIS — E1122 Type 2 diabetes mellitus with diabetic chronic kidney disease: Secondary | ICD-10-CM | POA: Diagnosis not present

## 2020-06-16 DIAGNOSIS — D509 Iron deficiency anemia, unspecified: Secondary | ICD-10-CM | POA: Diagnosis not present

## 2020-06-16 DIAGNOSIS — D631 Anemia in chronic kidney disease: Secondary | ICD-10-CM | POA: Diagnosis not present

## 2020-06-16 LAB — POCT GLYCOSYLATED HEMOGLOBIN (HGB A1C): HbA1c, POC (controlled diabetic range): 6.1 % (ref 0.0–7.0)

## 2020-06-16 NOTE — Patient Instructions (Signed)

## 2020-06-16 NOTE — Progress Notes (Signed)
06/16/2020   Endocrinology follow-up note   Subjective:    Patient ID: [name] [name], male    DOB: 01-09-1951, PCP [name], MD   Past Medical History:  Diagnosis Date  . Anemia   . Arthritis   . Asthma   . Bell palsy   . CAD (coronary artery disease)    a. 2014 MV: abnl w/ infap ischemia; b. 03/2013 Cath: aneurysmal bleb in the LAD w/ otw nonobs dzs-->Med Rx.  . Chronic back pain   . Chronic knee pain    a. 09/2015 s/p R TKA.  Marland Kitchen Chronic pain   . Chronic shoulder pain   . Chronic sinusitis   . CKD (chronic kidney disease), stage IV ([place])    stage 4 per office visit note of Dr Lowanda Foster on 05/2015   . COPD (chronic obstructive pulmonary disease) ([place])   . Diabetes mellitus without complication ([place])    type II   . Essential hypertension   . GERD (gastroesophageal reflux disease)   . Gout   . Gout   . Hepatomegaly    noted on noncontrast CT 2015  . History of hiatal hernia   . Hyperlipidemia   . Lateral meniscus tear   . Obesity    [place]  . Obstructive sleep apnea    does not use cpap   . On home oxygen therapy    uses 2l when is going somewhere per patient   . PUD (peptic ulcer disease)    remote, reports f/u EGD about 8 years ago unremarkable   . Reactive airway disease    related to exposure to chemical during 9/11  . Renal insufficiency   . Sinusitis   . Vitamin D deficiency    Past Surgical History:  Procedure Laterality Date  . ASAD LT SHOULDER  12/2008   left shoulder  . AV FISTULA PLACEMENT Left 08/09/2016   Procedure: BRACHIOCEPHALIC ARTERIOVENOUS (AV) FISTULA CREATION LEFT ARM;  Surgeon: [name], MD;  Location: [facility];  Service: Vascular;  Laterality: Left;  . CAPD INSERTION N/A 10/07/2018   Procedure: LAPAROSCOPIC PERITONEAL CATHETER PLACEMENT;  Surgeon: Kieth Brightly Arta Bruce, MD;  Location: [facility];  Service: General;  Laterality: N/A;  . CATARACT EXTRACTION W/PHACO Left 03/28/2016   Procedure: CATARACT EXTRACTION PHACO  AND INTRAOCULAR LENS PLACEMENT LEFT EYE;  Surgeon: [name], MD;  Location: [facility];  Service: Ophthalmology;  Laterality: Left;  CDE: 4.77  . CATARACT EXTRACTION W/PHACO Right 04/11/2016   Procedure: CATARACT EXTRACTION PHACO AND INTRAOCULAR LENS PLACEMENT RIGHT EYE; CDE:  4.74;  Surgeon: [name], MD;  Location: [facility];  Service: Ophthalmology;  Laterality: Right;  . COLONOSCOPY  10/2008   Fields: Rectal polyp obliterated, not retrieved, hemorrhoids, single ascending colon diverticulum near the CV. Next colonoscopy April 2020  . COLONOSCOPY N/A 12/25/2014   SLF: 1. Colorectal polyps (2) removed 2. Small internal hemorrhoids 3. the left colon is severely redundant  . DOPPLER ECHOCARDIOGRAPHY    . ESOPHAGOGASTRODUODENOSCOPY N/A 12/25/2014   SLF: 1. Anemia most likely due to CRI, gastritis, gastric polyps 2. Moderate non-erosive gastriits and mild duodenitis.  3.TWo large gstric polyps removed.   Marland Kitchen EYE SURGERY  12/22/2010   tear duct probing-Rule  . FOREIGN BODY REMOVAL  03/29/2011   Procedure: REMOVAL FOREIGN BODY EXTREMITY;  Surgeon: Arther Abbott, MD;  Location: [facility];  Service: Orthopedics;  Laterality: Right;  Removal Foreign Body Right Thumb  .  IR FLUORO GUIDE CV LINE RIGHT  08/06/2018  . IR US GUIDE VASC ACCESS RIGHT  08/06/2018  . KNEE ARTHROSCOPY  10/2007   left  . KNEE ARTHROSCOPY WITH LATERAL MENISECTOMY Right 10/14/2015   Procedure: LEFT KNEE ARTHROSCOPY WITH PARTIAL LATERAL MENISECTOMY;  Surgeon: Carole Civil, MD;  Location: [facility];  Service: Orthopedics;  Laterality: Right;  . LEFT HEART CATHETERIZATION WITH CORONARY ANGIOGRAM N/A 03/28/2013   Procedure: LEFT HEART CATHETERIZATION WITH CORONARY ANGIOGRAM;  Surgeon: Leonie Man, MD;  Location: [facility];  Service: Cardiovascular;  Laterality: N/A;  . NM MYOVIEW LTD    . PENILE PROSTHESIS IMPLANT N/A 08/16/2015   Procedure: PENILE PROTHESIS INFLATABLE, three piece, Excisional biopsy of Penile ulcer, Penile molding;   Surgeon: Carolan Clines, MD;  Location: [facility];  Service: Urology;  Laterality: N/A;  . PENILE PROSTHESIS IMPLANT N/A 12/24/2017   Procedure: REMOVAL AND  REPLACEMENT  COLOPLAST PENILE PROSTHESIS;  Surgeon: Lucas Mallow, MD;  Location: [facility];  Service: Urology;  Laterality: N/A;  . QUADRICEPS TENDON REPAIR  07/21/2011   Procedure: REPAIR QUADRICEP TENDON;  Surgeon: Arther Abbott, MD;  Location: [facility];  Service: Orthopedics;  Laterality: Right;  . TOENAIL EXCISION     removed [id number]  . UMBILICAL HERNIA REPAIR  2007   [place]   Social History   Socioeconomic History  . Marital status: Married    Spouse name: Not on file  . Number of children: 2  . Years of education: 12th grade  . Highest education level: Not on file  Occupational History  . Occupation: disabled  . Occupation:      Employer: UNEMPLOYED  Tobacco Use  . Smoking status: Former Smoker    Packs/day: 1.00    Years: 25.00    Pack years: 25.00    Types: Cigarettes    Quit date: 03/27/2010    Years since quitting: 10.2  . Smokeless tobacco: Never Used  . Tobacco comment: Quit x 7 years  Vaping Use  . Vaping Use: Never used  Substance and Sexual Activity  . Alcohol use: Yes    Alcohol/week: 0.0 standard drinks    Comment: occasionally  . Drug use: Not Currently    Types: Marijuana    Comment: cocaine- last time used- 11/24/2017 , marijuana-   . Sexual activity: Not on file  Other Topics Concern  . Not on file  Social History Narrative   He quit smoking in 2010. 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 He developed pulmonary problems, became disabled because of lower airway disease in 2009.       WATCHES BASKETBALL. HIS TEAM IS [employer].   Social Determinants of Health   Financial Resource Strain:   . Difficulty of Paying Living Expenses: Not on file  Food Insecurity:   . Worried About Charity fundraiser in the Last Year: Not on file  . Ran Out of Food  in the Last Year: Not on file  Transportation Needs:   . Lack of Transportation (Medical): Not on file  . Lack of Transportation (Non-Medical): Not on file  Physical Activity:   . Days of Exercise per Week: Not on file  . Minutes of Exercise per Session: Not on file  Stress:   . Feeling of Stress : Not on file  Social Connections:   . Frequency of Communication with Friends and Family: Not on file  . Frequency of Social Gatherings with Friends and  Family: Not on file  . Attends Religious Services: Not on file  . Active Member of Clubs or Organizations: Not on file  . Attends Archivist Meetings: Not on file  . Marital Status: Not on file   Outpatient Encounter Medications as of 06/16/2020  Medication Sig  . amLODipine (NORVASC) 5 MG tablet Take 1 tablet (5 mg total) by mouth daily.  Marland Kitchen aspirin EC 81 MG tablet Take 81 mg by mouth daily. Swallow whole.  . budesonide-formoterol (SYMBICORT) 160-4.5 MCG/ACT inhaler Inhale 2 puffs into the lungs 2 (two) times daily.  . calcitRIOL (ROCALTROL) 0.25 MCG capsule Take 0.25 mcg by mouth daily.  . DROPLET PEN NEEDLES 31G X 5 MM MISC INJECT SUBCUTANEOUSLY TWICE DAILY AS DIRECTED  . fluticasone (FLONASE) 50 MCG/ACT nasal spray Place 2 sprays into both nostrils daily.  . insulin glargine (LANTUS SOLOSTAR) 100 UNIT/ML Solostar Pen Inject 30 Units into the skin at bedtime.  Marland Kitchen losartan (COZAAR) 100 MG tablet   . mometasone (NASONEX) 50 MCG/ACT nasal spray Place 2 sprays into the nose 2 (two) times daily as needed (allergies).   . NOVOLOG FLEXPEN 100 UNIT/ML FlexPen   . pantoprazole (PROTONIX) 40 MG tablet TAKE 1 TABLET TWICE DAILY 30 MINUTES PRIOR TO MEALS  . simvastatin (ZOCOR) 20 MG tablet Take 20 mg by mouth every morning.   . TRADJENTA 5 MG TABS tablet TAKE 1 TABLET EVERY DAY   No facility-administered encounter medications on file as of 06/16/2020.   ALLERGIES: Allergies  Allergen Reactions  . Tramadol Hcl   . Opana [Oxymorphone Hcl]  Itching  . Oxymorphone Itching  . Tramadol Itching   VACCINATION STATUS: Immunization History  Administered Date(s) Administered  . Influenza, High Dose Seasonal PF 09/12/2018, 04/17/2019, 05/11/2020  . Influenza-Unspecified 09/12/2018, 04/01/2019  . Moderna SARS-COVID-2 Vaccination 09/18/2019, 10/16/2019, 05/24/2020  . PPD Test 07/19/2018, 09/07/2018, 11/18/2019  . Pneumococcal Polysaccharide-23 09/13/2018  . Td 03/27/2011    Diabetes He presents for his follow-up diabetic visit. He has type 2 diabetes mellitus. Onset time: He was diagnosed approximately 20 years ago. His disease course has been stable. There are no hypoglycemic associated symptoms. Pertinent negatives for hypoglycemia include no confusion, headaches, pallor or seizures. Pertinent negatives for diabetes include no chest pain, no fatigue, no polydipsia, no polyphagia, no polyuria and no weakness. There are no hypoglycemic complications. Symptoms are stable. Diabetic complications include heart disease and nephropathy. (Patient with end-stage renal disease now on hemodialysis.) Risk factors for coronary artery disease include dyslipidemia, diabetes mellitus, hypertension, obesity, sedentary lifestyle, tobacco exposure and male sex. Current diabetic treatment includes insulin injections and oral agent (monotherapy). He is compliant with treatment most of the time. His weight is stable. He is following a generally unhealthy diet. He has had a previous visit with a dietitian. He rarely participates in exercise. His home blood glucose trend is fluctuating minimally. His breakfast blood glucose range is generally 110-130 mg/dl. (He presents today with his meter and logs showing stable glycemic profile.  His POCT A1c today is 6.1%, essentially unchanged from last visit of 6%.  There are rare, mild episodes of fasting hypoglycemia noted, typically due to sleeping in.) An ACE inhibitor/angiotensin II receptor blocker is being taken. He sees a  podiatrist.Eye exam is current.  Hypertension This is a chronic problem. The current episode started more than 1 year ago. The problem has been gradually improving since onset. The problem is controlled. Pertinent negatives include no chest pain, headaches, neck pain, palpitations or shortness of  breath. There are no associated agents to hypertension. Risk factors for coronary artery disease include diabetes mellitus, dyslipidemia, obesity, smoking/tobacco exposure, sedentary lifestyle and male gender. Past treatments include angiotensin blockers and calcium channel blockers. There are no compliance problems.  Hypertensive end-organ damage includes kidney disease and CAD/MI. Identifiable causes of hypertension include chronic renal disease and sleep apnea.  Hyperlipidemia This is a chronic problem. The current episode started more than 1 year ago. The problem is controlled. Recent lipid tests were reviewed and are variable (LDL normal at 63 triglycerides elevated at 157.). Exacerbating diseases include chronic renal disease, diabetes and obesity. Factors aggravating his hyperlipidemia include fatty foods. Pertinent negatives include no chest pain, myalgias or shortness of breath. Current antihyperlipidemic treatment includes statins. The current treatment provides moderate improvement of lipids. Compliance problems include adherence to diet and adherence to exercise.  Risk factors for coronary artery disease include diabetes mellitus, dyslipidemia, hypertension, male sex, a sedentary lifestyle and obesity.    Review of systems  Constitutional: + Minimally fluctuating body weight,  current Body mass index is 41.2 kg/m. , no fatigue, no subjective hyperthermia, no subjective hypothermia Eyes: no blurry vision, no xerophthalmia ENT: no sore throat, no nodules palpated in throat, no dysphagia/odynophagia, no hoarseness Cardiovascular: no chest pain, no shortness of breath, no palpitations, no leg  swelling Respiratory: no cough, no shortness of breath Gastrointestinal: no nausea/vomiting/diarrhea Musculoskeletal: no muscle/joint aches Skin: no rashes, no hyperemia Neurological: no tremors, no numbness, no tingling, no dizziness Psychiatric: no depression, no anxiety   Objective:    BP 129/85 (BP Location: Right Arm)   Pulse 65   Ht 5\' 4"  (1.626 m)   Wt 240 lb (108.9 kg)   BMI 41.20 kg/m   Wt Readings from Last 3 Encounters:  06/16/20 240 lb (108.9 kg)  06/11/20 235 lb 6.4 oz (106.8 kg)  04/07/20 237 lb 12.8 oz (107.9 kg)    BP Readings from Last 3 Encounters:  06/16/20 129/85  06/11/20 126/74  04/07/20 130/70     Physical Exam- Limited  Constitutional:  Body mass index is 41.2 kg/m. , not in acute distress, normal state of mind Eyes:  EOMI, no exophthalmos Neck: Supple Thyroid: No gross goiter Cardiovascular: RRR, no murmers, rubs, or gallops, no edema Respiratory: Adequate breathing efforts, no crackles, rales, rhonchi, or wheezing Musculoskeletal: no gross deformities, strength intact in all four extremities, no gross restriction of joint movements Skin:  no rashes, no hyperemia Neurological: no tremor with outstretched hands   POCT ABI Results 06/16/20   Right ABI:  1.14      Left ABI:  1.16  Right leg systolic / diastolic: 341/96 mmHg Left leg systolic / diastolic: 222/97 mmHg  Arm systolic / diastolic: 989/21 mmHG  Detailed report will be scanned into patient chart.  Results for orders placed or performed in visit on 06/16/20  HgB A1c  Result Value Ref Range   Hemoglobin A1C     HbA1c POC (<> result, manual entry)     HbA1c, POC (prediabetic range)     HbA1c, POC (controlled diabetic range) 6.1 0.0 - 7.0 %   Diabetic Labs (most recent): Lab Results  Component Value Date   HGBA1C 6.1 06/16/2020   HGBA1C 6.0 (A) 02/16/2020   HGBA1C 6.4 (A) 10/14/2019   Lipid Panel     Component Value Date/Time   CHOL 117 02/02/2020 1410   TRIG 157 (H)  02/02/2020 1410   HDL 27 (L) 02/02/2020 1410   CHOLHDL 4.3 02/02/2020  1410   CHOLHDL 2.8 09/06/2018 1018   VLDL 38 (H) 06/16/2016 0956   LDLCALC 63 02/02/2020 1410   LDLCALC 52 09/06/2018 1018     Assessment & Plan:   1. Type 2 diabetes mellitus with stage 4 chronic kidney disease, with long-term current use of insulin (HCC)  -His  Diabetes is complicated by end-stage renal disease recently initiated on hemodialysis,  and patient remains at a high risk for more acute and chronic complications of diabetes which include CAD, CVA, CKD, retinopathy, and neuropathy. These are all discussed in detail with the patient.  He presents today with his meter and logs showing stable glycemic profile.  His POCT A1c today is 6.1%, essentially unchanged from last visit of 6%.  There are rare, mild episodes of fasting hypoglycemia noted, typically due to sleeping in.   Recent labs reviewed.  - Nutritional counseling repeated at each appointment due to patients tendency to fall back in to old habits.  - The patient admits there is a room for improvement in their diet and drink choices. -  Suggestion is made for the patient to avoid simple carbohydrates from their diet including Cakes, Sweet Desserts / Pastries, Ice Cream, Soda (diet and regular), Sweet Tea, Candies, Chips, Cookies, Sweet Pastries,  Store Bought Juices, Alcohol in Excess of  1-2 drinks a day, Artificial Sweeteners, Coffee Creamer, and "Sugar-free" Products. This will help patient to have stable blood glucose profile and potentially avoid unintended weight gain.   - I encouraged the patient to switch to  unprocessed or minimally processed complex starch and increased protein intake (animal or plant source), fruits, and vegetables.   - Patient is advised to stick to a routine mealtimes to eat 3 meals  a day and avoid unnecessary snacks ( to snack only to correct hypoglycemia).  - I have approached patient with the following individualized  plan to manage diabetes and patient agrees.  -He is advised to stay away from alcohol and smoking.  -Based on his stable glycemic profile, he is advised to continue his Lantus to 30 units SQ nightly and continue Tradjenta 5 mg p.o. daily with breakfast.  -He is encouraged to continue monitoring blood glucose twice daily, before breakfast and before bed, and to call the clinic if he has readings less than 70 or greater than 200 for 3 tests in a row.  He is following with Jearld Fenton, CDE for DM education.   -He will continue to follow-up with nephrology  given stage 5 CKD on HD.  -he is not a candidate for metformin and SGLT2 inhibitors due to CKD.   - Patient specific target  for A1c; LDL, HDL, Triglycerides, were discussed in detail.  2) BP/HTN:  His blood pressure is controlled to target. He is advised to continue Norvasc 5 mg po daily and Losartan 100 mg po daily.    3) Lipids/HPL:  His recent lipid panel from 7/19/21shows LDL stable at 63 and triglycerides worsening at 157.  He is advised to continue simvastatin 20 mg p.o. nightly.  Discussed dietary changes to lower triglyceride number.  Wants to work on lifestyle modifications.  Will consider adding fenofibrate if patient unable to control triglycerides with diet and exercise alone.  4)  Weight/Diet: His Body mass index is 41.2 kg/m.- clearly complicating his DM care. He is a candidate for modest weight loss.   CDE consult in progress, exercise, and carbohydrates information provided.   5) Chronic Care/Health Maintenance: -Patient is on ARB and Statin  medications and encouraged to continue to follow up with Ophthalmology, nephrology (he is status post fistula placement in prep for hemodialysis) Podiatrist at least yearly or according to recommendations, and advised to quit smoking. I have recommended yearly flu vaccine and pneumonia vaccination at least every 5 years; moderate intensity exercise for up to 150 minutes weekly; and   sleep for at least 7 hours a day.  - I advised patient to maintain close follow up with his PCP for primary care needs.      - Time spent on this patient care encounter:  30 min, of which > 50% was spent in  counseling and the rest reviewing his blood glucose logs , discussing his hypoglycemia and hyperglycemia episodes, reviewing his current and  previous labs / studies  ( including abstraction from other facilities) and medications  doses and developing a  long term treatment plan and documenting his care.   Please refer to Patient Instructions for Blood Glucose Monitoring and Insulin/Medications Dosing Guide"  in media tab for additional information. Please  also refer to " Patient Self Inventory" in the Media  tab for reviewed elements of pertinent patient history.  [name] [name] participated in the discussions, expressed understanding, and voiced agreement with the above plans.  All questions were answered to his satisfaction. he is encouraged to contact clinic should he have any questions or concerns prior to his return visit.    Follow up plan: -Return in about 6 months (around 12/15/2020) for Diabetes follow up with A1c in office, No previsit labs, Bring glucometer and logs.     Rayetta Pigg, [facility] [address] [place], [place] [address] Phone: [phone] Fax: [phone]   06/16/2020, 10:08 AM

## 2020-06-17 ENCOUNTER — Ambulatory Visit: Payer: Medicare Other | Admitting: Nurse Practitioner

## 2020-06-17 DIAGNOSIS — D631 Anemia in chronic kidney disease: Secondary | ICD-10-CM | POA: Diagnosis not present

## 2020-06-17 DIAGNOSIS — D509 Iron deficiency anemia, unspecified: Secondary | ICD-10-CM | POA: Diagnosis not present

## 2020-06-17 DIAGNOSIS — Z992 Dependence on renal dialysis: Secondary | ICD-10-CM | POA: Diagnosis not present

## 2020-06-17 DIAGNOSIS — N186 End stage renal disease: Secondary | ICD-10-CM | POA: Diagnosis not present

## 2020-06-18 DIAGNOSIS — D631 Anemia in chronic kidney disease: Secondary | ICD-10-CM | POA: Diagnosis not present

## 2020-06-18 DIAGNOSIS — Z992 Dependence on renal dialysis: Secondary | ICD-10-CM | POA: Diagnosis not present

## 2020-06-18 DIAGNOSIS — D509 Iron deficiency anemia, unspecified: Secondary | ICD-10-CM | POA: Diagnosis not present

## 2020-06-18 DIAGNOSIS — N186 End stage renal disease: Secondary | ICD-10-CM | POA: Diagnosis not present

## 2020-06-19 DIAGNOSIS — D631 Anemia in chronic kidney disease: Secondary | ICD-10-CM | POA: Diagnosis not present

## 2020-06-19 DIAGNOSIS — N186 End stage renal disease: Secondary | ICD-10-CM | POA: Diagnosis not present

## 2020-06-19 DIAGNOSIS — Z992 Dependence on renal dialysis: Secondary | ICD-10-CM | POA: Diagnosis not present

## 2020-06-19 DIAGNOSIS — D509 Iron deficiency anemia, unspecified: Secondary | ICD-10-CM | POA: Diagnosis not present

## 2020-06-20 DIAGNOSIS — D631 Anemia in chronic kidney disease: Secondary | ICD-10-CM | POA: Diagnosis not present

## 2020-06-20 DIAGNOSIS — N186 End stage renal disease: Secondary | ICD-10-CM | POA: Diagnosis not present

## 2020-06-20 DIAGNOSIS — Z992 Dependence on renal dialysis: Secondary | ICD-10-CM | POA: Diagnosis not present

## 2020-06-20 DIAGNOSIS — D509 Iron deficiency anemia, unspecified: Secondary | ICD-10-CM | POA: Diagnosis not present

## 2020-06-21 DIAGNOSIS — N186 End stage renal disease: Secondary | ICD-10-CM | POA: Diagnosis not present

## 2020-06-21 DIAGNOSIS — D631 Anemia in chronic kidney disease: Secondary | ICD-10-CM | POA: Diagnosis not present

## 2020-06-21 DIAGNOSIS — Z992 Dependence on renal dialysis: Secondary | ICD-10-CM | POA: Diagnosis not present

## 2020-06-21 DIAGNOSIS — D509 Iron deficiency anemia, unspecified: Secondary | ICD-10-CM | POA: Diagnosis not present

## 2020-06-22 DIAGNOSIS — E1165 Type 2 diabetes mellitus with hyperglycemia: Secondary | ICD-10-CM | POA: Diagnosis not present

## 2020-06-22 DIAGNOSIS — D631 Anemia in chronic kidney disease: Secondary | ICD-10-CM | POA: Diagnosis not present

## 2020-06-22 DIAGNOSIS — D509 Iron deficiency anemia, unspecified: Secondary | ICD-10-CM | POA: Diagnosis not present

## 2020-06-22 DIAGNOSIS — Z992 Dependence on renal dialysis: Secondary | ICD-10-CM | POA: Diagnosis not present

## 2020-06-22 DIAGNOSIS — Z23 Encounter for immunization: Secondary | ICD-10-CM | POA: Diagnosis not present

## 2020-06-22 DIAGNOSIS — I10 Essential (primary) hypertension: Secondary | ICD-10-CM | POA: Diagnosis not present

## 2020-06-22 DIAGNOSIS — N186 End stage renal disease: Secondary | ICD-10-CM | POA: Diagnosis not present

## 2020-06-23 DIAGNOSIS — Z992 Dependence on renal dialysis: Secondary | ICD-10-CM | POA: Diagnosis not present

## 2020-06-23 DIAGNOSIS — D509 Iron deficiency anemia, unspecified: Secondary | ICD-10-CM | POA: Diagnosis not present

## 2020-06-23 DIAGNOSIS — N186 End stage renal disease: Secondary | ICD-10-CM | POA: Diagnosis not present

## 2020-06-23 DIAGNOSIS — D631 Anemia in chronic kidney disease: Secondary | ICD-10-CM | POA: Diagnosis not present

## 2020-06-24 DIAGNOSIS — D631 Anemia in chronic kidney disease: Secondary | ICD-10-CM | POA: Diagnosis not present

## 2020-06-24 DIAGNOSIS — Z992 Dependence on renal dialysis: Secondary | ICD-10-CM | POA: Diagnosis not present

## 2020-06-24 DIAGNOSIS — D509 Iron deficiency anemia, unspecified: Secondary | ICD-10-CM | POA: Diagnosis not present

## 2020-06-24 DIAGNOSIS — N186 End stage renal disease: Secondary | ICD-10-CM | POA: Diagnosis not present

## 2020-06-25 DIAGNOSIS — D631 Anemia in chronic kidney disease: Secondary | ICD-10-CM | POA: Diagnosis not present

## 2020-06-25 DIAGNOSIS — N186 End stage renal disease: Secondary | ICD-10-CM | POA: Diagnosis not present

## 2020-06-25 DIAGNOSIS — D509 Iron deficiency anemia, unspecified: Secondary | ICD-10-CM | POA: Diagnosis not present

## 2020-06-25 DIAGNOSIS — Z992 Dependence on renal dialysis: Secondary | ICD-10-CM | POA: Diagnosis not present

## 2020-06-26 DIAGNOSIS — D631 Anemia in chronic kidney disease: Secondary | ICD-10-CM | POA: Diagnosis not present

## 2020-06-26 DIAGNOSIS — N186 End stage renal disease: Secondary | ICD-10-CM | POA: Diagnosis not present

## 2020-06-26 DIAGNOSIS — D509 Iron deficiency anemia, unspecified: Secondary | ICD-10-CM | POA: Diagnosis not present

## 2020-06-26 DIAGNOSIS — Z992 Dependence on renal dialysis: Secondary | ICD-10-CM | POA: Diagnosis not present

## 2020-06-27 DIAGNOSIS — Z992 Dependence on renal dialysis: Secondary | ICD-10-CM | POA: Diagnosis not present

## 2020-06-27 DIAGNOSIS — D631 Anemia in chronic kidney disease: Secondary | ICD-10-CM | POA: Diagnosis not present

## 2020-06-27 DIAGNOSIS — D509 Iron deficiency anemia, unspecified: Secondary | ICD-10-CM | POA: Diagnosis not present

## 2020-06-27 DIAGNOSIS — N186 End stage renal disease: Secondary | ICD-10-CM | POA: Diagnosis not present

## 2020-06-28 DIAGNOSIS — Z992 Dependence on renal dialysis: Secondary | ICD-10-CM | POA: Diagnosis not present

## 2020-06-28 DIAGNOSIS — D509 Iron deficiency anemia, unspecified: Secondary | ICD-10-CM | POA: Diagnosis not present

## 2020-06-28 DIAGNOSIS — N186 End stage renal disease: Secondary | ICD-10-CM | POA: Diagnosis not present

## 2020-06-28 DIAGNOSIS — D631 Anemia in chronic kidney disease: Secondary | ICD-10-CM | POA: Diagnosis not present

## 2020-06-29 DIAGNOSIS — D509 Iron deficiency anemia, unspecified: Secondary | ICD-10-CM | POA: Diagnosis not present

## 2020-06-29 DIAGNOSIS — N186 End stage renal disease: Secondary | ICD-10-CM | POA: Diagnosis not present

## 2020-06-29 DIAGNOSIS — D631 Anemia in chronic kidney disease: Secondary | ICD-10-CM | POA: Diagnosis not present

## 2020-06-29 DIAGNOSIS — Z992 Dependence on renal dialysis: Secondary | ICD-10-CM | POA: Diagnosis not present

## 2020-06-30 DIAGNOSIS — D631 Anemia in chronic kidney disease: Secondary | ICD-10-CM | POA: Diagnosis not present

## 2020-06-30 DIAGNOSIS — D509 Iron deficiency anemia, unspecified: Secondary | ICD-10-CM | POA: Diagnosis not present

## 2020-06-30 DIAGNOSIS — N186 End stage renal disease: Secondary | ICD-10-CM | POA: Diagnosis not present

## 2020-06-30 DIAGNOSIS — Z992 Dependence on renal dialysis: Secondary | ICD-10-CM | POA: Diagnosis not present

## 2020-07-01 DIAGNOSIS — D509 Iron deficiency anemia, unspecified: Secondary | ICD-10-CM | POA: Diagnosis not present

## 2020-07-01 DIAGNOSIS — Z992 Dependence on renal dialysis: Secondary | ICD-10-CM | POA: Diagnosis not present

## 2020-07-01 DIAGNOSIS — N186 End stage renal disease: Secondary | ICD-10-CM | POA: Diagnosis not present

## 2020-07-01 DIAGNOSIS — D631 Anemia in chronic kidney disease: Secondary | ICD-10-CM | POA: Diagnosis not present

## 2020-07-02 DIAGNOSIS — D631 Anemia in chronic kidney disease: Secondary | ICD-10-CM | POA: Diagnosis not present

## 2020-07-02 DIAGNOSIS — D509 Iron deficiency anemia, unspecified: Secondary | ICD-10-CM | POA: Diagnosis not present

## 2020-07-02 DIAGNOSIS — Z992 Dependence on renal dialysis: Secondary | ICD-10-CM | POA: Diagnosis not present

## 2020-07-02 DIAGNOSIS — N186 End stage renal disease: Secondary | ICD-10-CM | POA: Diagnosis not present

## 2020-07-03 DIAGNOSIS — D509 Iron deficiency anemia, unspecified: Secondary | ICD-10-CM | POA: Diagnosis not present

## 2020-07-03 DIAGNOSIS — N186 End stage renal disease: Secondary | ICD-10-CM | POA: Diagnosis not present

## 2020-07-03 DIAGNOSIS — Z992 Dependence on renal dialysis: Secondary | ICD-10-CM | POA: Diagnosis not present

## 2020-07-03 DIAGNOSIS — D631 Anemia in chronic kidney disease: Secondary | ICD-10-CM | POA: Diagnosis not present

## 2020-07-04 DIAGNOSIS — N186 End stage renal disease: Secondary | ICD-10-CM | POA: Diagnosis not present

## 2020-07-04 DIAGNOSIS — Z992 Dependence on renal dialysis: Secondary | ICD-10-CM | POA: Diagnosis not present

## 2020-07-04 DIAGNOSIS — D509 Iron deficiency anemia, unspecified: Secondary | ICD-10-CM | POA: Diagnosis not present

## 2020-07-04 DIAGNOSIS — D631 Anemia in chronic kidney disease: Secondary | ICD-10-CM | POA: Diagnosis not present

## 2020-07-05 DIAGNOSIS — D631 Anemia in chronic kidney disease: Secondary | ICD-10-CM | POA: Diagnosis not present

## 2020-07-05 DIAGNOSIS — Z992 Dependence on renal dialysis: Secondary | ICD-10-CM | POA: Diagnosis not present

## 2020-07-05 DIAGNOSIS — D509 Iron deficiency anemia, unspecified: Secondary | ICD-10-CM | POA: Diagnosis not present

## 2020-07-05 DIAGNOSIS — N186 End stage renal disease: Secondary | ICD-10-CM | POA: Diagnosis not present

## 2020-07-06 DIAGNOSIS — E1142 Type 2 diabetes mellitus with diabetic polyneuropathy: Secondary | ICD-10-CM | POA: Diagnosis not present

## 2020-07-06 DIAGNOSIS — D631 Anemia in chronic kidney disease: Secondary | ICD-10-CM | POA: Diagnosis not present

## 2020-07-06 DIAGNOSIS — N186 End stage renal disease: Secondary | ICD-10-CM | POA: Diagnosis not present

## 2020-07-06 DIAGNOSIS — D509 Iron deficiency anemia, unspecified: Secondary | ICD-10-CM | POA: Diagnosis not present

## 2020-07-06 DIAGNOSIS — B351 Tinea unguium: Secondary | ICD-10-CM | POA: Diagnosis not present

## 2020-07-06 DIAGNOSIS — Z992 Dependence on renal dialysis: Secondary | ICD-10-CM | POA: Diagnosis not present

## 2020-07-07 DIAGNOSIS — D509 Iron deficiency anemia, unspecified: Secondary | ICD-10-CM | POA: Diagnosis not present

## 2020-07-07 DIAGNOSIS — Z992 Dependence on renal dialysis: Secondary | ICD-10-CM | POA: Diagnosis not present

## 2020-07-07 DIAGNOSIS — N186 End stage renal disease: Secondary | ICD-10-CM | POA: Diagnosis not present

## 2020-07-07 DIAGNOSIS — D631 Anemia in chronic kidney disease: Secondary | ICD-10-CM | POA: Diagnosis not present

## 2020-07-08 DIAGNOSIS — N186 End stage renal disease: Secondary | ICD-10-CM | POA: Diagnosis not present

## 2020-07-08 DIAGNOSIS — D509 Iron deficiency anemia, unspecified: Secondary | ICD-10-CM | POA: Diagnosis not present

## 2020-07-08 DIAGNOSIS — Z992 Dependence on renal dialysis: Secondary | ICD-10-CM | POA: Diagnosis not present

## 2020-07-08 DIAGNOSIS — D631 Anemia in chronic kidney disease: Secondary | ICD-10-CM | POA: Diagnosis not present

## 2020-07-09 DIAGNOSIS — D509 Iron deficiency anemia, unspecified: Secondary | ICD-10-CM | POA: Diagnosis not present

## 2020-07-09 DIAGNOSIS — N186 End stage renal disease: Secondary | ICD-10-CM | POA: Diagnosis not present

## 2020-07-09 DIAGNOSIS — D631 Anemia in chronic kidney disease: Secondary | ICD-10-CM | POA: Diagnosis not present

## 2020-07-09 DIAGNOSIS — Z992 Dependence on renal dialysis: Secondary | ICD-10-CM | POA: Diagnosis not present

## 2020-07-10 DIAGNOSIS — N186 End stage renal disease: Secondary | ICD-10-CM | POA: Diagnosis not present

## 2020-07-10 DIAGNOSIS — D509 Iron deficiency anemia, unspecified: Secondary | ICD-10-CM | POA: Diagnosis not present

## 2020-07-10 DIAGNOSIS — Z992 Dependence on renal dialysis: Secondary | ICD-10-CM | POA: Diagnosis not present

## 2020-07-10 DIAGNOSIS — D631 Anemia in chronic kidney disease: Secondary | ICD-10-CM | POA: Diagnosis not present

## 2020-07-11 DIAGNOSIS — N186 End stage renal disease: Secondary | ICD-10-CM | POA: Diagnosis not present

## 2020-07-11 DIAGNOSIS — D631 Anemia in chronic kidney disease: Secondary | ICD-10-CM | POA: Diagnosis not present

## 2020-07-11 DIAGNOSIS — D509 Iron deficiency anemia, unspecified: Secondary | ICD-10-CM | POA: Diagnosis not present

## 2020-07-11 DIAGNOSIS — Z992 Dependence on renal dialysis: Secondary | ICD-10-CM | POA: Diagnosis not present

## 2020-07-12 DIAGNOSIS — D631 Anemia in chronic kidney disease: Secondary | ICD-10-CM | POA: Diagnosis not present

## 2020-07-12 DIAGNOSIS — Z992 Dependence on renal dialysis: Secondary | ICD-10-CM | POA: Diagnosis not present

## 2020-07-12 DIAGNOSIS — D509 Iron deficiency anemia, unspecified: Secondary | ICD-10-CM | POA: Diagnosis not present

## 2020-07-12 DIAGNOSIS — N186 End stage renal disease: Secondary | ICD-10-CM | POA: Diagnosis not present

## 2020-07-13 DIAGNOSIS — D631 Anemia in chronic kidney disease: Secondary | ICD-10-CM | POA: Diagnosis not present

## 2020-07-13 DIAGNOSIS — B078 Other viral warts: Secondary | ICD-10-CM | POA: Diagnosis not present

## 2020-07-13 DIAGNOSIS — N186 End stage renal disease: Secondary | ICD-10-CM | POA: Diagnosis not present

## 2020-07-13 DIAGNOSIS — D509 Iron deficiency anemia, unspecified: Secondary | ICD-10-CM | POA: Diagnosis not present

## 2020-07-13 DIAGNOSIS — L820 Inflamed seborrheic keratosis: Secondary | ICD-10-CM | POA: Diagnosis not present

## 2020-07-13 DIAGNOSIS — L218 Other seborrheic dermatitis: Secondary | ICD-10-CM | POA: Diagnosis not present

## 2020-07-13 DIAGNOSIS — Z992 Dependence on renal dialysis: Secondary | ICD-10-CM | POA: Diagnosis not present

## 2020-07-14 ENCOUNTER — Telehealth: Payer: Self-pay | Admitting: Primary Care

## 2020-07-14 DIAGNOSIS — Z992 Dependence on renal dialysis: Secondary | ICD-10-CM | POA: Diagnosis not present

## 2020-07-14 DIAGNOSIS — D509 Iron deficiency anemia, unspecified: Secondary | ICD-10-CM | POA: Diagnosis not present

## 2020-07-14 DIAGNOSIS — N186 End stage renal disease: Secondary | ICD-10-CM | POA: Diagnosis not present

## 2020-07-14 DIAGNOSIS — D631 Anemia in chronic kidney disease: Secondary | ICD-10-CM | POA: Diagnosis not present

## 2020-07-14 NOTE — Telephone Encounter (Signed)
Attempted to call pt but unable to reach. Left message for him to return call. Due to multiple attempts trying to reach pt and unable to do so, encounter will be closed and letter will be mailed to pt.

## 2020-07-14 NOTE — Telephone Encounter (Signed)
Error

## 2020-07-15 ENCOUNTER — Encounter: Payer: Self-pay | Admitting: *Deleted

## 2020-07-15 DIAGNOSIS — D631 Anemia in chronic kidney disease: Secondary | ICD-10-CM | POA: Diagnosis not present

## 2020-07-15 DIAGNOSIS — D509 Iron deficiency anemia, unspecified: Secondary | ICD-10-CM | POA: Diagnosis not present

## 2020-07-15 DIAGNOSIS — N186 End stage renal disease: Secondary | ICD-10-CM | POA: Diagnosis not present

## 2020-07-15 DIAGNOSIS — Z992 Dependence on renal dialysis: Secondary | ICD-10-CM | POA: Diagnosis not present

## 2020-07-16 DIAGNOSIS — D631 Anemia in chronic kidney disease: Secondary | ICD-10-CM | POA: Diagnosis not present

## 2020-07-16 DIAGNOSIS — N186 End stage renal disease: Secondary | ICD-10-CM | POA: Diagnosis not present

## 2020-07-16 DIAGNOSIS — Z992 Dependence on renal dialysis: Secondary | ICD-10-CM | POA: Diagnosis not present

## 2020-07-16 DIAGNOSIS — D509 Iron deficiency anemia, unspecified: Secondary | ICD-10-CM | POA: Diagnosis not present

## 2020-07-17 DIAGNOSIS — D509 Iron deficiency anemia, unspecified: Secondary | ICD-10-CM | POA: Diagnosis not present

## 2020-07-17 DIAGNOSIS — Z992 Dependence on renal dialysis: Secondary | ICD-10-CM | POA: Diagnosis not present

## 2020-07-17 DIAGNOSIS — N2581 Secondary hyperparathyroidism of renal origin: Secondary | ICD-10-CM | POA: Diagnosis not present

## 2020-07-17 DIAGNOSIS — D631 Anemia in chronic kidney disease: Secondary | ICD-10-CM | POA: Diagnosis not present

## 2020-07-17 DIAGNOSIS — N186 End stage renal disease: Secondary | ICD-10-CM | POA: Diagnosis not present

## 2020-07-18 DIAGNOSIS — D509 Iron deficiency anemia, unspecified: Secondary | ICD-10-CM | POA: Diagnosis not present

## 2020-07-18 DIAGNOSIS — N2581 Secondary hyperparathyroidism of renal origin: Secondary | ICD-10-CM | POA: Diagnosis not present

## 2020-07-18 DIAGNOSIS — D631 Anemia in chronic kidney disease: Secondary | ICD-10-CM | POA: Diagnosis not present

## 2020-07-18 DIAGNOSIS — N186 End stage renal disease: Secondary | ICD-10-CM | POA: Diagnosis not present

## 2020-07-18 DIAGNOSIS — Z992 Dependence on renal dialysis: Secondary | ICD-10-CM | POA: Diagnosis not present

## 2020-07-19 DIAGNOSIS — D631 Anemia in chronic kidney disease: Secondary | ICD-10-CM | POA: Diagnosis not present

## 2020-07-19 DIAGNOSIS — Z992 Dependence on renal dialysis: Secondary | ICD-10-CM | POA: Diagnosis not present

## 2020-07-19 DIAGNOSIS — N186 End stage renal disease: Secondary | ICD-10-CM | POA: Diagnosis not present

## 2020-07-19 DIAGNOSIS — D509 Iron deficiency anemia, unspecified: Secondary | ICD-10-CM | POA: Diagnosis not present

## 2020-07-19 DIAGNOSIS — N2581 Secondary hyperparathyroidism of renal origin: Secondary | ICD-10-CM | POA: Diagnosis not present

## 2020-07-20 DIAGNOSIS — N186 End stage renal disease: Secondary | ICD-10-CM | POA: Diagnosis not present

## 2020-07-20 DIAGNOSIS — N2581 Secondary hyperparathyroidism of renal origin: Secondary | ICD-10-CM | POA: Diagnosis not present

## 2020-07-20 DIAGNOSIS — D631 Anemia in chronic kidney disease: Secondary | ICD-10-CM | POA: Diagnosis not present

## 2020-07-20 DIAGNOSIS — Z992 Dependence on renal dialysis: Secondary | ICD-10-CM | POA: Diagnosis not present

## 2020-07-20 DIAGNOSIS — D509 Iron deficiency anemia, unspecified: Secondary | ICD-10-CM | POA: Diagnosis not present

## 2020-07-21 DIAGNOSIS — N186 End stage renal disease: Secondary | ICD-10-CM | POA: Diagnosis not present

## 2020-07-21 DIAGNOSIS — E119 Type 2 diabetes mellitus without complications: Secondary | ICD-10-CM | POA: Diagnosis not present

## 2020-07-21 DIAGNOSIS — Z992 Dependence on renal dialysis: Secondary | ICD-10-CM | POA: Diagnosis not present

## 2020-07-21 DIAGNOSIS — D509 Iron deficiency anemia, unspecified: Secondary | ICD-10-CM | POA: Diagnosis not present

## 2020-07-21 DIAGNOSIS — Z794 Long term (current) use of insulin: Secondary | ICD-10-CM | POA: Diagnosis not present

## 2020-07-21 DIAGNOSIS — D631 Anemia in chronic kidney disease: Secondary | ICD-10-CM | POA: Diagnosis not present

## 2020-07-21 DIAGNOSIS — N2581 Secondary hyperparathyroidism of renal origin: Secondary | ICD-10-CM | POA: Diagnosis not present

## 2020-07-22 ENCOUNTER — Other Ambulatory Visit: Payer: Self-pay

## 2020-07-22 ENCOUNTER — Ambulatory Visit (INDEPENDENT_AMBULATORY_CARE_PROVIDER_SITE_OTHER): Payer: Medicare Other | Admitting: Orthopedic Surgery

## 2020-07-22 ENCOUNTER — Telehealth: Payer: Self-pay | Admitting: Primary Care

## 2020-07-22 ENCOUNTER — Encounter: Payer: Self-pay | Admitting: Orthopedic Surgery

## 2020-07-22 ENCOUNTER — Ambulatory Visit: Payer: Medicare Other

## 2020-07-22 VITALS — BP 139/82 | HR 72 | Ht 64.0 in | Wt 244.0 lb

## 2020-07-22 DIAGNOSIS — N2581 Secondary hyperparathyroidism of renal origin: Secondary | ICD-10-CM | POA: Diagnosis not present

## 2020-07-22 DIAGNOSIS — M7531 Calcific tendinitis of right shoulder: Secondary | ICD-10-CM

## 2020-07-22 DIAGNOSIS — M25511 Pain in right shoulder: Secondary | ICD-10-CM

## 2020-07-22 DIAGNOSIS — G4733 Obstructive sleep apnea (adult) (pediatric): Secondary | ICD-10-CM

## 2020-07-22 DIAGNOSIS — G8929 Other chronic pain: Secondary | ICD-10-CM | POA: Diagnosis not present

## 2020-07-22 DIAGNOSIS — M25512 Pain in left shoulder: Secondary | ICD-10-CM | POA: Diagnosis not present

## 2020-07-22 DIAGNOSIS — Z9889 Other specified postprocedural states: Secondary | ICD-10-CM | POA: Diagnosis not present

## 2020-07-22 DIAGNOSIS — D631 Anemia in chronic kidney disease: Secondary | ICD-10-CM | POA: Diagnosis not present

## 2020-07-22 DIAGNOSIS — Z992 Dependence on renal dialysis: Secondary | ICD-10-CM | POA: Diagnosis not present

## 2020-07-22 DIAGNOSIS — D509 Iron deficiency anemia, unspecified: Secondary | ICD-10-CM | POA: Diagnosis not present

## 2020-07-22 DIAGNOSIS — N186 End stage renal disease: Secondary | ICD-10-CM | POA: Diagnosis not present

## 2020-07-22 NOTE — Progress Notes (Signed)
NEW PROBLEM//OFFICE VISIT  Summary assessment and plan:   Encounter Diagnoses  Name Primary?  . Chronic right shoulder pain Yes  . Chronic left shoulder pain   . Calcific tendinitis of right shoulder   . S/P left rotator cuff repair    INJECTIONS BOTH SHOULDERS  DUE TO MEDICAL HX NOT A CANDIDATE FOR SURGERY   Chief Complaint  Patient presents with  . Shoulder Pain    Right shld pain, Hurting for a while, injured many years ago, Catches in shoulder with ROM, Possibly coming from neck?  Normal and no pain with cervical spine ROM    70 year old male status post cuff repair left shoulder many years ago comes in with bilateral shoulder pain recent onset of right shoulder pain and continued chronic pain left shoulder  No recent trauma just a dull ache seems to be worse when lying on his side or reaching up over his head   Review of Systems  Constitutional: Negative for chills and fever.  Neurological: Negative for dizziness and tingling.     Past Medical History:  Diagnosis Date  . Anemia   . Arthritis   . Asthma   . Bell palsy   . CAD (coronary artery disease)    a. 2014 MV: abnl w/ infap ischemia; b. 03/2013 Cath: aneurysmal bleb in the LAD w/ otw nonobs dzs-->Med Rx.  . Chronic back pain   . Chronic knee pain    a. 09/2015 s/p R TKA.  Marland Kitchen Chronic pain   . Chronic shoulder pain   . Chronic sinusitis   . CKD (chronic kidney disease), stage IV ([place])    stage 4 per office visit note of Dr Lowanda Foster on 05/2015   . COPD (chronic obstructive pulmonary disease) ([place])   . Diabetes mellitus without complication ([place])    type II   . Essential hypertension   . GERD (gastroesophageal reflux disease)   . Gout   . Gout   . Hepatomegaly    noted on noncontrast CT 2015  . History of hiatal hernia   . Hyperlipidemia   . Lateral meniscus tear   . Obesity    [place]  . Obstructive sleep apnea    does not use cpap   . On home oxygen therapy    uses 2l when is going somewhere per  patient   . PUD (peptic ulcer disease)    remote, reports f/u EGD about 8 years ago unremarkable   . Reactive airway disease    related to exposure to chemical during 9/11  . Renal insufficiency   . Sinusitis   . Vitamin D deficiency     Past Surgical History:  Procedure Laterality Date  . ASAD LT SHOULDER  12/2008   left shoulder  . AV FISTULA PLACEMENT Left 08/09/2016   Procedure: BRACHIOCEPHALIC ARTERIOVENOUS (AV) FISTULA CREATION LEFT ARM;  Surgeon: [name], MD;  Location: [facility];  Service: Vascular;  Laterality: Left;  . CAPD INSERTION N/A 10/07/2018   Procedure: LAPAROSCOPIC PERITONEAL CATHETER PLACEMENT;  Surgeon: Kieth Brightly Arta Bruce, MD;  Location: [facility];  Service: General;  Laterality: N/A;  . CATARACT EXTRACTION W/PHACO Left 03/28/2016   Procedure: CATARACT EXTRACTION PHACO AND INTRAOCULAR LENS PLACEMENT LEFT EYE;  Surgeon: [name], MD;  Location: [facility];  Service: Ophthalmology;  Laterality: Left;  CDE: 4.77  . CATARACT EXTRACTION W/PHACO Right 04/11/2016   Procedure: CATARACT EXTRACTION PHACO AND INTRAOCULAR LENS PLACEMENT RIGHT EYE; CDE:  4.74;  Surgeon: [name], MD;  Location:  [facility];  Service: Ophthalmology;  Laterality: Right;  . COLONOSCOPY  10/2008   Fields: Rectal polyp obliterated, not retrieved, hemorrhoids, single ascending colon diverticulum near the CV. Next colonoscopy April 2020  . COLONOSCOPY N/A 12/25/2014   SLF: 1. Colorectal polyps (2) removed 2. Small internal hemorrhoids 3. the left colon is severely redundant  . DOPPLER ECHOCARDIOGRAPHY    . ESOPHAGOGASTRODUODENOSCOPY N/A 12/25/2014   SLF: 1. Anemia most likely due to CRI, gastritis, gastric polyps 2. Moderate non-erosive gastriits and mild duodenitis.  3.TWo large gstric polyps removed.   Marland Kitchen EYE SURGERY  12/22/2010   tear duct probing-[place]  . FOREIGN BODY REMOVAL  03/29/2011   Procedure: REMOVAL FOREIGN BODY EXTREMITY;  Surgeon: Arther Abbott, MD;  Location: [facility];  Service:  Orthopedics;  Laterality: Right;  Removal Foreign Body Right Thumb  . IR FLUORO GUIDE CV LINE RIGHT  08/06/2018  . IR US GUIDE VASC ACCESS RIGHT  08/06/2018  . KNEE ARTHROSCOPY  10/2007   left  . KNEE ARTHROSCOPY WITH LATERAL MENISECTOMY Right 10/14/2015   Procedure: LEFT KNEE ARTHROSCOPY WITH PARTIAL LATERAL MENISECTOMY;  Surgeon: Carole Civil, MD;  Location: [facility];  Service: Orthopedics;  Laterality: Right;  . LEFT HEART CATHETERIZATION WITH CORONARY ANGIOGRAM N/A 03/28/2013   Procedure: LEFT HEART CATHETERIZATION WITH CORONARY ANGIOGRAM;  Surgeon: Leonie Man, MD;  Location: [facility] CATH [facility];  Service: Cardiovascular;  Laterality: N/A;  . NM MYOVIEW LTD    . PENILE PROSTHESIS IMPLANT N/A 08/16/2015   Procedure: PENILE PROTHESIS INFLATABLE, three piece, Excisional biopsy of Penile ulcer, Penile molding;  Surgeon: Carolan Clines, MD;  Location: [facility];  Service: Urology;  Laterality: N/A;  . PENILE PROSTHESIS IMPLANT N/A 12/24/2017   Procedure: REMOVAL AND  REPLACEMENT  COLOPLAST PENILE PROSTHESIS;  Surgeon: Lucas Mallow, MD;  Location: [facility];  Service: Urology;  Laterality: N/A;  . QUADRICEPS TENDON REPAIR  07/21/2011   Procedure: REPAIR QUADRICEP TENDON;  Surgeon: Arther Abbott, MD;  Location: [facility];  Service: Orthopedics;  Laterality: Right;  . TOENAIL EXCISION     removed [id number]  . UMBILICAL HERNIA REPAIR  2007   [place]    Family History  Problem Relation Age of Onset  . Hypertension Mother        MI  . Cancer Mother        breast   . Diabetes Mother   . Diabetes Father   . Hypertension Father   . Hypertension Sister   . Diabetes Sister   . Arthritis Other   . Asthma Other   . Lung disease Other   . Anesthesia problems Neg Hx   . Hypotension Neg Hx   . Malignant hyperthermia Neg Hx   . Pseudochol deficiency Neg Hx   . Colon cancer Neg Hx    Social History   Tobacco Use  . Smoking status: Former Smoker    Packs/day: 1.00    Years: 25.00    Pack  years: 25.00    Types: Cigarettes    Quit date: 03/27/2010    Years since quitting: 10.3  . Smokeless tobacco: Never Used  . Tobacco comment: Quit x 7 years  Vaping Use  . Vaping Use: Never used  Substance Use Topics  . Alcohol use: Yes    Alcohol/week: 0.0 standard drinks    Comment: occasionally  . Drug use: Not Currently    Types: Marijuana    Comment: cocaine- last time used- 11/24/2017 , marijuana-     Allergies  Allergen Reactions  . Tramadol Hcl   . Opana [Oxymorphone Hcl] Itching  . Oxymorphone Itching  . Tramadol Itching    Current Meds  Medication Sig  . amLODipine (NORVASC) 5 MG tablet Take 1 tablet (5 mg total) by mouth daily.  Marland Kitchen aspirin EC 81 MG tablet Take 81 mg by mouth daily. Swallow whole.  . budesonide-formoterol (SYMBICORT) 160-4.5 MCG/ACT inhaler Inhale 2 puffs into the lungs 2 (two) times daily.  . calcitRIOL (ROCALTROL) 0.25 MCG capsule Take 0.25 mcg by mouth daily.  . DROPLET PEN NEEDLES 31G X 5 MM MISC INJECT SUBCUTANEOUSLY TWICE DAILY AS DIRECTED  . fluticasone (FLONASE) 50 MCG/ACT nasal spray Place 2 sprays into both nostrils daily.  . insulin glargine (LANTUS SOLOSTAR) 100 UNIT/ML Solostar Pen Inject 30 Units into the skin at bedtime.  Marland Kitchen losartan (COZAAR) 100 MG tablet   . mometasone (NASONEX) 50 MCG/ACT nasal spray Place 2 sprays into the nose 2 (two) times daily as needed (allergies).  . NOVOLOG FLEXPEN 100 UNIT/ML FlexPen   . pantoprazole (PROTONIX) 40 MG tablet TAKE 1 TABLET TWICE DAILY 30 MINUTES PRIOR TO MEALS  . simvastatin (ZOCOR) 20 MG tablet Take 20 mg by mouth every morning.  . TRADJENTA 5 MG TABS tablet TAKE 1 TABLET EVERY DAY    BP 139/82   Pulse 72   Ht 5\' 4"  (1.626 m)   Wt 244 lb (110.7 kg)   BMI 41.88 kg/m   Physical Exam Constitutional:      General: He is not in acute distress.    Appearance: He is well-developed.  Cardiovascular:     Comments: No peripheral edema Skin:    General: Skin is warm and dry.  Neurological:      Mental Status: He is alert and oriented to person, place, and time.     Sensory: No sensory deficit.     Coordination: Coordination normal.     Gait: Gait normal.     Deep Tendon Reflexes: Reflexes are normal and symmetric.  Psychiatric:        Mood and Affect: Mood normal.        Thought Content: Thought content normal.        Judgment: Judgment normal.     Ortho Exam Right shoulder has good strength in abduction and flexion he has full passive range of motion and painful arc of motion between 90 and 150 degrees he has crepitance on range of motion positive impingement sign Neer and Hawkins test  Left shoulder tender in the periacromial region full passive and active range of motion no weakness crepitance on range of motion   MEDICAL DECISION MAKING  A.  Encounter Diagnoses  Name Primary?  . Chronic right shoulder pain Yes  . Chronic left shoulder pain   . Calcific tendinitis of right shoulder   . S/P left rotator cuff repair     B. DATA ANALYSED:   IMAGING: Interpretation of images: Internal images show calcific tendinitis of the right shoulder previous images show no arthritis in the left shoulder MRI shows partial posterior undersurface cuff tear   Orders: None  Outside records reviewed: None   C. MANAGEMENT   Procedure injection right shoulder subacromial joint and left shoulder subacromial joint   procedure note the subacromial injection shoulder RIGHT  Verbal consent was obtained to inject the  RIGHT   Shoulder  Timeout was completed to confirm the injection site is a subacromial space of the  RIGHT  shoulder   Medication used Depo-Medrol  40 mg and lidocaine 1% 3 cc  Anesthesia was provided by ethyl chloride  The injection was performed in the RIGHT  posterior subacromial space. After pinning the skin with alcohol and anesthetized the skin with ethyl chloride the subacromial space was injected using a 20-gauge needle. There were no  complications  Sterile dressing was applied.    Procedure note the subacromial injection shoulder left   Verbal consent was obtained to inject the  Left   Shoulder  Timeout was completed to confirm the injection site is a subacromial space of the  left  shoulder  Medication used Depo-Medrol 40 mg and lidocaine 1% 3 cc  Anesthesia was provided by ethyl chloride  The injection was performed in the left  posterior subacromial space. After pinning the skin with alcohol and anesthetized the skin with ethyl chloride the subacromial space was injected using a 20-gauge needle. There were no complications  Sterile dressing was applied.   No orders of the defined types were placed in this encounter.     Arther Abbott, MD  07/22/2020 10:56 AM

## 2020-07-22 NOTE — Patient Instructions (Signed)
Calcific Tendinitis Calcific tendinitis occurs when crystals of calcium are deposited in a tendon. Tendons are tough, cord-like tissues that connect muscle to bone. Tendons are an important part of joints. They make joints move, and they absorb some of the stress that a joint receives during use. When calcium is deposited in the tendon, the tendon becomes stiff and painful and it can become swollen. Calcific tendinitis occurs frequently in a tendon in the shoulder joint (rotator cuff). What are the causes? The cause of calcific tendinitis is not known. It may be associated with:  Overusing a tendon, such as from repetitive motion.  Excess stress on the tendon.  Age-related wear and tear.  Repetitive, mild injuries. What increases the risk? This condition is more likely to develop in:  People who do activities that involve repetitive motions.  Older people. What are the signs or symptoms? This condition may or may not be painful. If there is pain, it may occur when moving the joint. Other symptoms may include:  Tenderness when pressure is applied to the tendon.  A snapping or popping sound when the joint moves.  Decreased motion of the joint.  Difficulty sleeping due to pain in the joint. How is this diagnosed? This condition is diagnosed with a physical exam. You may also have tests, such as:  X-rays.  MRI.  CT scan. How is this treated? This condition generally gets better on its own. Treatment may also include:  Resting, icing, applying pressure (compression), and raising (elevating) the area above the level of your heart. This is known as RICE therapy.  Medicines to help reduce inflammation or to help reduce pain.  Physical therapy to strengthen and stretch the tendon.  Following a specific exercise program to keep the joint working properly. Treatment for more severe calcific tendinitis may require:  Injecting steroids or pain-relieving medicines into or around the  joint.  Manipulating the joint after you are given medicine to numb the area (local anesthetic).  Inflating the joint with sterile fluid to increase the flexibility of the tendons.  Shock wave therapy, which involves focusing sound waves on the joint. If other treatments do not work, surgery may be done to clean out the calcium deposits and repair the tendon as needed. Most people do not need surgery. Follow these instructions at home: Managing pain, stiffness, and swelling      If directed, put heat on the affected area before you exercise or as often as told by your health care provider. Use the heat source that your health care provider recommends, such as a moist heat pack or a heating pad. ? Place a towel between your skin and the heat source. ? Leave the heat on for 20-30 minutes. ? Remove the heat if your skin turns bright red. This is especially important if you are unable to feel pain, heat, or cold. You may have a greater risk of getting burned.  Move the fingers or toes of the affected limb often, if this applies. This can help to prevent stiffness and lessen swelling.  If directed, elevate the affected area above the level of your heart while you are sitting or lying down.  If directed, put ice on the affected area: ? Put ice in a plastic bag. ? Place a towel between your skin and the bag. ? Leave the ice on for 20 minutes, 2-3 times a day. General instructions  Take over-the-counter and prescription medicines only as told by your health care provider.  Do  not drive or use heavy machinery while taking prescription pain medicine.  Follow recommendations from your health care provider for activity and exercise. Ask your health care provider what activities are safe for you.  Avoid using the affected area while you are experiencing symptoms of tendinitis.  Wear an elastic bandage or compression wrap only as told by your health care provider.  Keep all follow-up visits  as told by your health care provider. This is important. Contact a health care provider if:  You have pain or numbness that gets worse.  You develop new weakness.  You notice increased joint stiffness or a sensation of looseness in the joint.  You notice increasing redness, swelling, or warmth around the joint area. Get help right away if:  You have a fever for more than 2-3 days.  You have symptoms for more than 2-3 days.  You have a fever and your symptoms suddenly get worse. This information is not intended to replace advice given to you by your health care provider. Make sure you discuss any questions you have with your health care provider. Document Revised: 10/23/2018 Document Reviewed: 03/27/2016 Elsevier Patient Education  [place].

## 2020-07-22 NOTE — Telephone Encounter (Signed)
BW--pt called back today and stated that his DME is [facility].    Below is from 11/26 phone message:   Per Eustaquio Maize, pt thinks DME is [facility] for his CPAP. Attempted to call [facility] 11/26 but office was closed due to holiday. Will need to call Monday 11/29 to see if they are pt's DME for cpap and if so, do they have a copy of his HST.  If [facility] is not pt's DME, pt will need to have a new sleep study performed.  Please advise.

## 2020-07-23 DIAGNOSIS — Z992 Dependence on renal dialysis: Secondary | ICD-10-CM | POA: Diagnosis not present

## 2020-07-23 DIAGNOSIS — D631 Anemia in chronic kidney disease: Secondary | ICD-10-CM | POA: Diagnosis not present

## 2020-07-23 DIAGNOSIS — N2581 Secondary hyperparathyroidism of renal origin: Secondary | ICD-10-CM | POA: Diagnosis not present

## 2020-07-23 DIAGNOSIS — N186 End stage renal disease: Secondary | ICD-10-CM | POA: Diagnosis not present

## 2020-07-23 DIAGNOSIS — D509 Iron deficiency anemia, unspecified: Secondary | ICD-10-CM | POA: Diagnosis not present

## 2020-07-23 NOTE — Telephone Encounter (Signed)
[facility] is the type of machine not a DME company. CPAP machine is >71 years old, if we or his DME company do not have a copy of past sleep study then he will need to repeat study before getting him new machine

## 2020-07-23 NOTE — Telephone Encounter (Signed)
I called and spoke with [facility] and they do not have pt on file. ? Where did he have his sleep study done?- LMTCB to ask pt

## 2020-07-24 DIAGNOSIS — D631 Anemia in chronic kidney disease: Secondary | ICD-10-CM | POA: Diagnosis not present

## 2020-07-24 DIAGNOSIS — N2581 Secondary hyperparathyroidism of renal origin: Secondary | ICD-10-CM | POA: Diagnosis not present

## 2020-07-24 DIAGNOSIS — D509 Iron deficiency anemia, unspecified: Secondary | ICD-10-CM | POA: Diagnosis not present

## 2020-07-24 DIAGNOSIS — N186 End stage renal disease: Secondary | ICD-10-CM | POA: Diagnosis not present

## 2020-07-24 DIAGNOSIS — Z992 Dependence on renal dialysis: Secondary | ICD-10-CM | POA: Diagnosis not present

## 2020-07-25 DIAGNOSIS — D631 Anemia in chronic kidney disease: Secondary | ICD-10-CM | POA: Diagnosis not present

## 2020-07-25 DIAGNOSIS — N2581 Secondary hyperparathyroidism of renal origin: Secondary | ICD-10-CM | POA: Diagnosis not present

## 2020-07-25 DIAGNOSIS — Z992 Dependence on renal dialysis: Secondary | ICD-10-CM | POA: Diagnosis not present

## 2020-07-25 DIAGNOSIS — D509 Iron deficiency anemia, unspecified: Secondary | ICD-10-CM | POA: Diagnosis not present

## 2020-07-25 DIAGNOSIS — N186 End stage renal disease: Secondary | ICD-10-CM | POA: Diagnosis not present

## 2020-07-26 DIAGNOSIS — D509 Iron deficiency anemia, unspecified: Secondary | ICD-10-CM | POA: Diagnosis not present

## 2020-07-26 DIAGNOSIS — D631 Anemia in chronic kidney disease: Secondary | ICD-10-CM | POA: Diagnosis not present

## 2020-07-26 DIAGNOSIS — N2581 Secondary hyperparathyroidism of renal origin: Secondary | ICD-10-CM | POA: Diagnosis not present

## 2020-07-26 DIAGNOSIS — Z992 Dependence on renal dialysis: Secondary | ICD-10-CM | POA: Diagnosis not present

## 2020-07-26 DIAGNOSIS — N186 End stage renal disease: Secondary | ICD-10-CM | POA: Diagnosis not present

## 2020-07-27 DIAGNOSIS — N186 End stage renal disease: Secondary | ICD-10-CM | POA: Diagnosis not present

## 2020-07-27 DIAGNOSIS — D631 Anemia in chronic kidney disease: Secondary | ICD-10-CM | POA: Diagnosis not present

## 2020-07-27 DIAGNOSIS — Z992 Dependence on renal dialysis: Secondary | ICD-10-CM | POA: Diagnosis not present

## 2020-07-27 DIAGNOSIS — N2581 Secondary hyperparathyroidism of renal origin: Secondary | ICD-10-CM | POA: Diagnosis not present

## 2020-07-27 DIAGNOSIS — D509 Iron deficiency anemia, unspecified: Secondary | ICD-10-CM | POA: Diagnosis not present

## 2020-07-27 NOTE — Telephone Encounter (Signed)
ATC pt but line continued to ring without VM option. WCB.

## 2020-07-28 DIAGNOSIS — D509 Iron deficiency anemia, unspecified: Secondary | ICD-10-CM | POA: Diagnosis not present

## 2020-07-28 DIAGNOSIS — D631 Anemia in chronic kidney disease: Secondary | ICD-10-CM | POA: Diagnosis not present

## 2020-07-28 DIAGNOSIS — Z992 Dependence on renal dialysis: Secondary | ICD-10-CM | POA: Diagnosis not present

## 2020-07-28 DIAGNOSIS — N2581 Secondary hyperparathyroidism of renal origin: Secondary | ICD-10-CM | POA: Diagnosis not present

## 2020-07-28 DIAGNOSIS — N186 End stage renal disease: Secondary | ICD-10-CM | POA: Diagnosis not present

## 2020-07-29 DIAGNOSIS — N2581 Secondary hyperparathyroidism of renal origin: Secondary | ICD-10-CM | POA: Diagnosis not present

## 2020-07-29 DIAGNOSIS — D631 Anemia in chronic kidney disease: Secondary | ICD-10-CM | POA: Diagnosis not present

## 2020-07-29 DIAGNOSIS — Z992 Dependence on renal dialysis: Secondary | ICD-10-CM | POA: Diagnosis not present

## 2020-07-29 DIAGNOSIS — D509 Iron deficiency anemia, unspecified: Secondary | ICD-10-CM | POA: Diagnosis not present

## 2020-07-29 DIAGNOSIS — N186 End stage renal disease: Secondary | ICD-10-CM | POA: Diagnosis not present

## 2020-07-30 DIAGNOSIS — N2581 Secondary hyperparathyroidism of renal origin: Secondary | ICD-10-CM | POA: Diagnosis not present

## 2020-07-30 DIAGNOSIS — D509 Iron deficiency anemia, unspecified: Secondary | ICD-10-CM | POA: Diagnosis not present

## 2020-07-30 DIAGNOSIS — N186 End stage renal disease: Secondary | ICD-10-CM | POA: Diagnosis not present

## 2020-07-30 DIAGNOSIS — Z992 Dependence on renal dialysis: Secondary | ICD-10-CM | POA: Diagnosis not present

## 2020-07-30 DIAGNOSIS — D631 Anemia in chronic kidney disease: Secondary | ICD-10-CM | POA: Diagnosis not present

## 2020-07-30 NOTE — Telephone Encounter (Signed)
Called and spoke to pt. Pt is unsure where he had his sleep study. He states he thinks it was around [place] or [place] about 15-20 years ago. Pt did say he would be ok with doing a new sleep study. Pt requesting a HST if possible.   Beth, please advise if ok to order HST. Thanks.

## 2020-07-31 DIAGNOSIS — D631 Anemia in chronic kidney disease: Secondary | ICD-10-CM | POA: Diagnosis not present

## 2020-07-31 DIAGNOSIS — N2581 Secondary hyperparathyroidism of renal origin: Secondary | ICD-10-CM | POA: Diagnosis not present

## 2020-07-31 DIAGNOSIS — D509 Iron deficiency anemia, unspecified: Secondary | ICD-10-CM | POA: Diagnosis not present

## 2020-07-31 DIAGNOSIS — N186 End stage renal disease: Secondary | ICD-10-CM | POA: Diagnosis not present

## 2020-07-31 DIAGNOSIS — Z992 Dependence on renal dialysis: Secondary | ICD-10-CM | POA: Diagnosis not present

## 2020-08-01 DIAGNOSIS — N2581 Secondary hyperparathyroidism of renal origin: Secondary | ICD-10-CM | POA: Diagnosis not present

## 2020-08-01 DIAGNOSIS — N186 End stage renal disease: Secondary | ICD-10-CM | POA: Diagnosis not present

## 2020-08-01 DIAGNOSIS — Z992 Dependence on renal dialysis: Secondary | ICD-10-CM | POA: Diagnosis not present

## 2020-08-01 DIAGNOSIS — D631 Anemia in chronic kidney disease: Secondary | ICD-10-CM | POA: Diagnosis not present

## 2020-08-01 DIAGNOSIS — D509 Iron deficiency anemia, unspecified: Secondary | ICD-10-CM | POA: Diagnosis not present

## 2020-08-02 DIAGNOSIS — N186 End stage renal disease: Secondary | ICD-10-CM | POA: Diagnosis not present

## 2020-08-02 DIAGNOSIS — Z992 Dependence on renal dialysis: Secondary | ICD-10-CM | POA: Diagnosis not present

## 2020-08-02 DIAGNOSIS — N2581 Secondary hyperparathyroidism of renal origin: Secondary | ICD-10-CM | POA: Diagnosis not present

## 2020-08-02 DIAGNOSIS — D509 Iron deficiency anemia, unspecified: Secondary | ICD-10-CM | POA: Diagnosis not present

## 2020-08-02 DIAGNOSIS — D631 Anemia in chronic kidney disease: Secondary | ICD-10-CM | POA: Diagnosis not present

## 2020-08-03 DIAGNOSIS — N186 End stage renal disease: Secondary | ICD-10-CM | POA: Diagnosis not present

## 2020-08-03 DIAGNOSIS — Z992 Dependence on renal dialysis: Secondary | ICD-10-CM | POA: Diagnosis not present

## 2020-08-03 DIAGNOSIS — D631 Anemia in chronic kidney disease: Secondary | ICD-10-CM | POA: Diagnosis not present

## 2020-08-03 DIAGNOSIS — N2581 Secondary hyperparathyroidism of renal origin: Secondary | ICD-10-CM | POA: Diagnosis not present

## 2020-08-03 DIAGNOSIS — D509 Iron deficiency anemia, unspecified: Secondary | ICD-10-CM | POA: Diagnosis not present

## 2020-08-03 NOTE — Telephone Encounter (Signed)
Order placed. Will forward to PCCs to schedule.

## 2020-08-03 NOTE — Telephone Encounter (Signed)
Per previous notes pt is aware he needs hst.  Order has been placed.  I called & left pt vm making him aware order has been placed and he will be called in a few weeks to schedule.  Nothing further needed.

## 2020-08-03 NOTE — Telephone Encounter (Signed)
Yes, please order HST for hx sleep apnea

## 2020-08-04 ENCOUNTER — Other Ambulatory Visit: Payer: Self-pay | Admitting: Nurse Practitioner

## 2020-08-04 ENCOUNTER — Other Ambulatory Visit: Payer: Self-pay | Admitting: "Endocrinology

## 2020-08-04 DIAGNOSIS — N186 End stage renal disease: Secondary | ICD-10-CM | POA: Diagnosis not present

## 2020-08-04 DIAGNOSIS — N2581 Secondary hyperparathyroidism of renal origin: Secondary | ICD-10-CM | POA: Diagnosis not present

## 2020-08-04 DIAGNOSIS — Z992 Dependence on renal dialysis: Secondary | ICD-10-CM | POA: Diagnosis not present

## 2020-08-04 DIAGNOSIS — D631 Anemia in chronic kidney disease: Secondary | ICD-10-CM | POA: Diagnosis not present

## 2020-08-04 DIAGNOSIS — D509 Iron deficiency anemia, unspecified: Secondary | ICD-10-CM | POA: Diagnosis not present

## 2020-08-05 DIAGNOSIS — Z992 Dependence on renal dialysis: Secondary | ICD-10-CM | POA: Diagnosis not present

## 2020-08-05 DIAGNOSIS — N2581 Secondary hyperparathyroidism of renal origin: Secondary | ICD-10-CM | POA: Diagnosis not present

## 2020-08-05 DIAGNOSIS — D509 Iron deficiency anemia, unspecified: Secondary | ICD-10-CM | POA: Diagnosis not present

## 2020-08-05 DIAGNOSIS — D631 Anemia in chronic kidney disease: Secondary | ICD-10-CM | POA: Diagnosis not present

## 2020-08-05 DIAGNOSIS — N186 End stage renal disease: Secondary | ICD-10-CM | POA: Diagnosis not present

## 2020-08-06 DIAGNOSIS — Z992 Dependence on renal dialysis: Secondary | ICD-10-CM | POA: Diagnosis not present

## 2020-08-06 DIAGNOSIS — D509 Iron deficiency anemia, unspecified: Secondary | ICD-10-CM | POA: Diagnosis not present

## 2020-08-06 DIAGNOSIS — N2581 Secondary hyperparathyroidism of renal origin: Secondary | ICD-10-CM | POA: Diagnosis not present

## 2020-08-06 DIAGNOSIS — D631 Anemia in chronic kidney disease: Secondary | ICD-10-CM | POA: Diagnosis not present

## 2020-08-06 DIAGNOSIS — N186 End stage renal disease: Secondary | ICD-10-CM | POA: Diagnosis not present

## 2020-08-07 DIAGNOSIS — N186 End stage renal disease: Secondary | ICD-10-CM | POA: Diagnosis not present

## 2020-08-07 DIAGNOSIS — D509 Iron deficiency anemia, unspecified: Secondary | ICD-10-CM | POA: Diagnosis not present

## 2020-08-07 DIAGNOSIS — Z992 Dependence on renal dialysis: Secondary | ICD-10-CM | POA: Diagnosis not present

## 2020-08-07 DIAGNOSIS — N2581 Secondary hyperparathyroidism of renal origin: Secondary | ICD-10-CM | POA: Diagnosis not present

## 2020-08-07 DIAGNOSIS — D631 Anemia in chronic kidney disease: Secondary | ICD-10-CM | POA: Diagnosis not present

## 2020-08-08 DIAGNOSIS — Z992 Dependence on renal dialysis: Secondary | ICD-10-CM | POA: Diagnosis not present

## 2020-08-08 DIAGNOSIS — N186 End stage renal disease: Secondary | ICD-10-CM | POA: Diagnosis not present

## 2020-08-08 DIAGNOSIS — D631 Anemia in chronic kidney disease: Secondary | ICD-10-CM | POA: Diagnosis not present

## 2020-08-08 DIAGNOSIS — N2581 Secondary hyperparathyroidism of renal origin: Secondary | ICD-10-CM | POA: Diagnosis not present

## 2020-08-08 DIAGNOSIS — D509 Iron deficiency anemia, unspecified: Secondary | ICD-10-CM | POA: Diagnosis not present

## 2020-08-09 DIAGNOSIS — N2581 Secondary hyperparathyroidism of renal origin: Secondary | ICD-10-CM | POA: Diagnosis not present

## 2020-08-09 DIAGNOSIS — D509 Iron deficiency anemia, unspecified: Secondary | ICD-10-CM | POA: Diagnosis not present

## 2020-08-09 DIAGNOSIS — E1165 Type 2 diabetes mellitus with hyperglycemia: Secondary | ICD-10-CM | POA: Diagnosis not present

## 2020-08-09 DIAGNOSIS — Z992 Dependence on renal dialysis: Secondary | ICD-10-CM | POA: Diagnosis not present

## 2020-08-09 DIAGNOSIS — N186 End stage renal disease: Secondary | ICD-10-CM | POA: Diagnosis not present

## 2020-08-09 DIAGNOSIS — D631 Anemia in chronic kidney disease: Secondary | ICD-10-CM | POA: Diagnosis not present

## 2020-08-09 DIAGNOSIS — M5459 Other low back pain: Secondary | ICD-10-CM | POA: Diagnosis not present

## 2020-08-09 DIAGNOSIS — I10 Essential (primary) hypertension: Secondary | ICD-10-CM | POA: Diagnosis not present

## 2020-08-10 ENCOUNTER — Other Ambulatory Visit (HOSPITAL_COMMUNITY): Payer: Self-pay | Admitting: Gerontology

## 2020-08-10 ENCOUNTER — Ambulatory Visit (HOSPITAL_COMMUNITY)
Admission: RE | Admit: 2020-08-10 | Discharge: 2020-08-10 | Disposition: A | Discharge status: Home / Self Care | Payer: Medicare Other | Source: Ambulatory Visit | Attending: Gerontology | Admitting: Gerontology

## 2020-08-10 ENCOUNTER — Other Ambulatory Visit: Payer: Self-pay

## 2020-08-10 DIAGNOSIS — M545 Low back pain, unspecified: Secondary | ICD-10-CM

## 2020-08-10 DIAGNOSIS — D631 Anemia in chronic kidney disease: Secondary | ICD-10-CM | POA: Diagnosis not present

## 2020-08-10 DIAGNOSIS — D509 Iron deficiency anemia, unspecified: Secondary | ICD-10-CM | POA: Diagnosis not present

## 2020-08-10 DIAGNOSIS — Z992 Dependence on renal dialysis: Secondary | ICD-10-CM | POA: Diagnosis not present

## 2020-08-10 DIAGNOSIS — N186 End stage renal disease: Secondary | ICD-10-CM | POA: Diagnosis not present

## 2020-08-10 DIAGNOSIS — N2581 Secondary hyperparathyroidism of renal origin: Secondary | ICD-10-CM | POA: Diagnosis not present

## 2020-08-11 DIAGNOSIS — D631 Anemia in chronic kidney disease: Secondary | ICD-10-CM | POA: Diagnosis not present

## 2020-08-11 DIAGNOSIS — N186 End stage renal disease: Secondary | ICD-10-CM | POA: Diagnosis not present

## 2020-08-11 DIAGNOSIS — Z992 Dependence on renal dialysis: Secondary | ICD-10-CM | POA: Diagnosis not present

## 2020-08-11 DIAGNOSIS — N2581 Secondary hyperparathyroidism of renal origin: Secondary | ICD-10-CM | POA: Diagnosis not present

## 2020-08-11 DIAGNOSIS — D509 Iron deficiency anemia, unspecified: Secondary | ICD-10-CM | POA: Diagnosis not present

## 2020-08-12 DIAGNOSIS — N2581 Secondary hyperparathyroidism of renal origin: Secondary | ICD-10-CM | POA: Diagnosis not present

## 2020-08-12 DIAGNOSIS — Z992 Dependence on renal dialysis: Secondary | ICD-10-CM | POA: Diagnosis not present

## 2020-08-12 DIAGNOSIS — N186 End stage renal disease: Secondary | ICD-10-CM | POA: Diagnosis not present

## 2020-08-12 DIAGNOSIS — D509 Iron deficiency anemia, unspecified: Secondary | ICD-10-CM | POA: Diagnosis not present

## 2020-08-12 DIAGNOSIS — D631 Anemia in chronic kidney disease: Secondary | ICD-10-CM | POA: Diagnosis not present

## 2020-08-13 DIAGNOSIS — D631 Anemia in chronic kidney disease: Secondary | ICD-10-CM | POA: Diagnosis not present

## 2020-08-13 DIAGNOSIS — N186 End stage renal disease: Secondary | ICD-10-CM | POA: Diagnosis not present

## 2020-08-13 DIAGNOSIS — D509 Iron deficiency anemia, unspecified: Secondary | ICD-10-CM | POA: Diagnosis not present

## 2020-08-13 DIAGNOSIS — N2581 Secondary hyperparathyroidism of renal origin: Secondary | ICD-10-CM | POA: Diagnosis not present

## 2020-08-13 DIAGNOSIS — Z992 Dependence on renal dialysis: Secondary | ICD-10-CM | POA: Diagnosis not present

## 2020-08-14 DIAGNOSIS — N186 End stage renal disease: Secondary | ICD-10-CM | POA: Diagnosis not present

## 2020-08-14 DIAGNOSIS — D509 Iron deficiency anemia, unspecified: Secondary | ICD-10-CM | POA: Diagnosis not present

## 2020-08-14 DIAGNOSIS — Z992 Dependence on renal dialysis: Secondary | ICD-10-CM | POA: Diagnosis not present

## 2020-08-14 DIAGNOSIS — N2581 Secondary hyperparathyroidism of renal origin: Secondary | ICD-10-CM | POA: Diagnosis not present

## 2020-08-14 DIAGNOSIS — D631 Anemia in chronic kidney disease: Secondary | ICD-10-CM | POA: Diagnosis not present

## 2020-08-15 DIAGNOSIS — D631 Anemia in chronic kidney disease: Secondary | ICD-10-CM | POA: Diagnosis not present

## 2020-08-15 DIAGNOSIS — N186 End stage renal disease: Secondary | ICD-10-CM | POA: Diagnosis not present

## 2020-08-15 DIAGNOSIS — D509 Iron deficiency anemia, unspecified: Secondary | ICD-10-CM | POA: Diagnosis not present

## 2020-08-15 DIAGNOSIS — Z992 Dependence on renal dialysis: Secondary | ICD-10-CM | POA: Diagnosis not present

## 2020-08-15 DIAGNOSIS — N2581 Secondary hyperparathyroidism of renal origin: Secondary | ICD-10-CM | POA: Diagnosis not present

## 2020-08-16 DIAGNOSIS — D631 Anemia in chronic kidney disease: Secondary | ICD-10-CM | POA: Diagnosis not present

## 2020-08-16 DIAGNOSIS — N2581 Secondary hyperparathyroidism of renal origin: Secondary | ICD-10-CM | POA: Diagnosis not present

## 2020-08-16 DIAGNOSIS — D509 Iron deficiency anemia, unspecified: Secondary | ICD-10-CM | POA: Diagnosis not present

## 2020-08-16 DIAGNOSIS — Z992 Dependence on renal dialysis: Secondary | ICD-10-CM | POA: Diagnosis not present

## 2020-08-16 DIAGNOSIS — N186 End stage renal disease: Secondary | ICD-10-CM | POA: Diagnosis not present

## 2020-08-17 DIAGNOSIS — D509 Iron deficiency anemia, unspecified: Secondary | ICD-10-CM | POA: Diagnosis not present

## 2020-08-17 DIAGNOSIS — N186 End stage renal disease: Secondary | ICD-10-CM | POA: Diagnosis not present

## 2020-08-17 DIAGNOSIS — Z992 Dependence on renal dialysis: Secondary | ICD-10-CM | POA: Diagnosis not present

## 2020-08-17 DIAGNOSIS — D631 Anemia in chronic kidney disease: Secondary | ICD-10-CM | POA: Diagnosis not present

## 2020-08-18 DIAGNOSIS — D631 Anemia in chronic kidney disease: Secondary | ICD-10-CM | POA: Diagnosis not present

## 2020-08-18 DIAGNOSIS — N186 End stage renal disease: Secondary | ICD-10-CM | POA: Diagnosis not present

## 2020-08-18 DIAGNOSIS — D509 Iron deficiency anemia, unspecified: Secondary | ICD-10-CM | POA: Diagnosis not present

## 2020-08-18 DIAGNOSIS — Z992 Dependence on renal dialysis: Secondary | ICD-10-CM | POA: Diagnosis not present

## 2020-08-19 DIAGNOSIS — D509 Iron deficiency anemia, unspecified: Secondary | ICD-10-CM | POA: Diagnosis not present

## 2020-08-19 DIAGNOSIS — N186 End stage renal disease: Secondary | ICD-10-CM | POA: Diagnosis not present

## 2020-08-19 DIAGNOSIS — Z992 Dependence on renal dialysis: Secondary | ICD-10-CM | POA: Diagnosis not present

## 2020-08-19 DIAGNOSIS — D631 Anemia in chronic kidney disease: Secondary | ICD-10-CM | POA: Diagnosis not present

## 2020-08-20 ENCOUNTER — Ambulatory Visit: Payer: Medicare Other

## 2020-08-20 ENCOUNTER — Other Ambulatory Visit: Payer: Self-pay

## 2020-08-20 DIAGNOSIS — G4733 Obstructive sleep apnea (adult) (pediatric): Secondary | ICD-10-CM

## 2020-08-20 DIAGNOSIS — D631 Anemia in chronic kidney disease: Secondary | ICD-10-CM | POA: Diagnosis not present

## 2020-08-20 DIAGNOSIS — D509 Iron deficiency anemia, unspecified: Secondary | ICD-10-CM | POA: Diagnosis not present

## 2020-08-20 DIAGNOSIS — Z992 Dependence on renal dialysis: Secondary | ICD-10-CM | POA: Diagnosis not present

## 2020-08-20 DIAGNOSIS — N186 End stage renal disease: Secondary | ICD-10-CM | POA: Diagnosis not present

## 2020-08-21 DIAGNOSIS — Z992 Dependence on renal dialysis: Secondary | ICD-10-CM | POA: Diagnosis not present

## 2020-08-21 DIAGNOSIS — N186 End stage renal disease: Secondary | ICD-10-CM | POA: Diagnosis not present

## 2020-08-21 DIAGNOSIS — D509 Iron deficiency anemia, unspecified: Secondary | ICD-10-CM | POA: Diagnosis not present

## 2020-08-21 DIAGNOSIS — D631 Anemia in chronic kidney disease: Secondary | ICD-10-CM | POA: Diagnosis not present

## 2020-08-22 DIAGNOSIS — D509 Iron deficiency anemia, unspecified: Secondary | ICD-10-CM | POA: Diagnosis not present

## 2020-08-22 DIAGNOSIS — Z992 Dependence on renal dialysis: Secondary | ICD-10-CM | POA: Diagnosis not present

## 2020-08-22 DIAGNOSIS — D631 Anemia in chronic kidney disease: Secondary | ICD-10-CM | POA: Diagnosis not present

## 2020-08-22 DIAGNOSIS — N186 End stage renal disease: Secondary | ICD-10-CM | POA: Diagnosis not present

## 2020-08-23 DIAGNOSIS — D631 Anemia in chronic kidney disease: Secondary | ICD-10-CM | POA: Diagnosis not present

## 2020-08-23 DIAGNOSIS — N186 End stage renal disease: Secondary | ICD-10-CM | POA: Diagnosis not present

## 2020-08-23 DIAGNOSIS — Z992 Dependence on renal dialysis: Secondary | ICD-10-CM | POA: Diagnosis not present

## 2020-08-23 DIAGNOSIS — D509 Iron deficiency anemia, unspecified: Secondary | ICD-10-CM | POA: Diagnosis not present

## 2020-08-24 DIAGNOSIS — Z992 Dependence on renal dialysis: Secondary | ICD-10-CM | POA: Diagnosis not present

## 2020-08-24 DIAGNOSIS — D509 Iron deficiency anemia, unspecified: Secondary | ICD-10-CM | POA: Diagnosis not present

## 2020-08-24 DIAGNOSIS — N186 End stage renal disease: Secondary | ICD-10-CM | POA: Diagnosis not present

## 2020-08-24 DIAGNOSIS — D631 Anemia in chronic kidney disease: Secondary | ICD-10-CM | POA: Diagnosis not present

## 2020-08-25 DIAGNOSIS — Z992 Dependence on renal dialysis: Secondary | ICD-10-CM | POA: Diagnosis not present

## 2020-08-25 DIAGNOSIS — N186 End stage renal disease: Secondary | ICD-10-CM | POA: Diagnosis not present

## 2020-08-25 DIAGNOSIS — D631 Anemia in chronic kidney disease: Secondary | ICD-10-CM | POA: Diagnosis not present

## 2020-08-25 DIAGNOSIS — D509 Iron deficiency anemia, unspecified: Secondary | ICD-10-CM | POA: Diagnosis not present

## 2020-08-26 DIAGNOSIS — D509 Iron deficiency anemia, unspecified: Secondary | ICD-10-CM | POA: Diagnosis not present

## 2020-08-26 DIAGNOSIS — N186 End stage renal disease: Secondary | ICD-10-CM | POA: Diagnosis not present

## 2020-08-26 DIAGNOSIS — Z992 Dependence on renal dialysis: Secondary | ICD-10-CM | POA: Diagnosis not present

## 2020-08-26 DIAGNOSIS — D631 Anemia in chronic kidney disease: Secondary | ICD-10-CM | POA: Diagnosis not present

## 2020-08-27 DIAGNOSIS — D631 Anemia in chronic kidney disease: Secondary | ICD-10-CM | POA: Diagnosis not present

## 2020-08-27 DIAGNOSIS — N186 End stage renal disease: Secondary | ICD-10-CM | POA: Diagnosis not present

## 2020-08-27 DIAGNOSIS — D509 Iron deficiency anemia, unspecified: Secondary | ICD-10-CM | POA: Diagnosis not present

## 2020-08-27 DIAGNOSIS — Z992 Dependence on renal dialysis: Secondary | ICD-10-CM | POA: Diagnosis not present

## 2020-08-28 DIAGNOSIS — N186 End stage renal disease: Secondary | ICD-10-CM | POA: Diagnosis not present

## 2020-08-28 DIAGNOSIS — D631 Anemia in chronic kidney disease: Secondary | ICD-10-CM | POA: Diagnosis not present

## 2020-08-28 DIAGNOSIS — Z992 Dependence on renal dialysis: Secondary | ICD-10-CM | POA: Diagnosis not present

## 2020-08-28 DIAGNOSIS — D509 Iron deficiency anemia, unspecified: Secondary | ICD-10-CM | POA: Diagnosis not present

## 2020-08-29 DIAGNOSIS — Z992 Dependence on renal dialysis: Secondary | ICD-10-CM | POA: Diagnosis not present

## 2020-08-29 DIAGNOSIS — D631 Anemia in chronic kidney disease: Secondary | ICD-10-CM | POA: Diagnosis not present

## 2020-08-29 DIAGNOSIS — N186 End stage renal disease: Secondary | ICD-10-CM | POA: Diagnosis not present

## 2020-08-29 DIAGNOSIS — D509 Iron deficiency anemia, unspecified: Secondary | ICD-10-CM | POA: Diagnosis not present

## 2020-08-30 ENCOUNTER — Telehealth: Payer: Self-pay | Admitting: Pulmonary Disease

## 2020-08-30 DIAGNOSIS — N186 End stage renal disease: Secondary | ICD-10-CM | POA: Diagnosis not present

## 2020-08-30 DIAGNOSIS — G4733 Obstructive sleep apnea (adult) (pediatric): Secondary | ICD-10-CM | POA: Diagnosis not present

## 2020-08-30 DIAGNOSIS — Z992 Dependence on renal dialysis: Secondary | ICD-10-CM | POA: Diagnosis not present

## 2020-08-30 DIAGNOSIS — D631 Anemia in chronic kidney disease: Secondary | ICD-10-CM | POA: Diagnosis not present

## 2020-08-30 DIAGNOSIS — D509 Iron deficiency anemia, unspecified: Secondary | ICD-10-CM | POA: Diagnosis not present

## 2020-08-30 NOTE — Telephone Encounter (Signed)
Please let patient know HST showed moderate OSA. He has been on CPAP in the past. Please place new order auto CPAP 5-20cm h20. FU in 3-4 months.

## 2020-08-30 NOTE — Telephone Encounter (Addendum)
Attempted to call pt but unable to reach. Left message for pt to return call. 

## 2020-08-30 NOTE — Telephone Encounter (Signed)
HST showed mod OSA with AHI 21/ hr Suggest autoCPAP if exact pressure not known

## 2020-08-31 DIAGNOSIS — D509 Iron deficiency anemia, unspecified: Secondary | ICD-10-CM | POA: Diagnosis not present

## 2020-08-31 DIAGNOSIS — N186 End stage renal disease: Secondary | ICD-10-CM | POA: Diagnosis not present

## 2020-08-31 DIAGNOSIS — Z992 Dependence on renal dialysis: Secondary | ICD-10-CM | POA: Diagnosis not present

## 2020-08-31 DIAGNOSIS — D631 Anemia in chronic kidney disease: Secondary | ICD-10-CM | POA: Diagnosis not present

## 2020-09-01 DIAGNOSIS — D631 Anemia in chronic kidney disease: Secondary | ICD-10-CM | POA: Diagnosis not present

## 2020-09-01 DIAGNOSIS — D509 Iron deficiency anemia, unspecified: Secondary | ICD-10-CM | POA: Diagnosis not present

## 2020-09-01 DIAGNOSIS — N186 End stage renal disease: Secondary | ICD-10-CM | POA: Diagnosis not present

## 2020-09-01 DIAGNOSIS — Z992 Dependence on renal dialysis: Secondary | ICD-10-CM | POA: Diagnosis not present

## 2020-09-01 NOTE — Telephone Encounter (Signed)
Spoke with patient. He verbalized understanding of results. Attempted to get the name of his current DME company. He did not know the name. Advised him that our PCCs can help get him setup with a new company. He verbalized understanding.   Nothing further needed at time of call.

## 2020-09-02 DIAGNOSIS — N186 End stage renal disease: Secondary | ICD-10-CM | POA: Diagnosis not present

## 2020-09-02 DIAGNOSIS — Z992 Dependence on renal dialysis: Secondary | ICD-10-CM | POA: Diagnosis not present

## 2020-09-02 DIAGNOSIS — D509 Iron deficiency anemia, unspecified: Secondary | ICD-10-CM | POA: Diagnosis not present

## 2020-09-02 DIAGNOSIS — D631 Anemia in chronic kidney disease: Secondary | ICD-10-CM | POA: Diagnosis not present

## 2020-09-03 DIAGNOSIS — N186 End stage renal disease: Secondary | ICD-10-CM | POA: Diagnosis not present

## 2020-09-03 DIAGNOSIS — D631 Anemia in chronic kidney disease: Secondary | ICD-10-CM | POA: Diagnosis not present

## 2020-09-03 DIAGNOSIS — D509 Iron deficiency anemia, unspecified: Secondary | ICD-10-CM | POA: Diagnosis not present

## 2020-09-03 DIAGNOSIS — Z992 Dependence on renal dialysis: Secondary | ICD-10-CM | POA: Diagnosis not present

## 2020-09-04 DIAGNOSIS — Z992 Dependence on renal dialysis: Secondary | ICD-10-CM | POA: Diagnosis not present

## 2020-09-04 DIAGNOSIS — N186 End stage renal disease: Secondary | ICD-10-CM | POA: Diagnosis not present

## 2020-09-04 DIAGNOSIS — D631 Anemia in chronic kidney disease: Secondary | ICD-10-CM | POA: Diagnosis not present

## 2020-09-04 DIAGNOSIS — D509 Iron deficiency anemia, unspecified: Secondary | ICD-10-CM | POA: Diagnosis not present

## 2020-09-05 DIAGNOSIS — D631 Anemia in chronic kidney disease: Secondary | ICD-10-CM | POA: Diagnosis not present

## 2020-09-05 DIAGNOSIS — D509 Iron deficiency anemia, unspecified: Secondary | ICD-10-CM | POA: Diagnosis not present

## 2020-09-05 DIAGNOSIS — N186 End stage renal disease: Secondary | ICD-10-CM | POA: Diagnosis not present

## 2020-09-05 DIAGNOSIS — Z992 Dependence on renal dialysis: Secondary | ICD-10-CM | POA: Diagnosis not present

## 2020-09-06 DIAGNOSIS — N186 End stage renal disease: Secondary | ICD-10-CM | POA: Diagnosis not present

## 2020-09-06 DIAGNOSIS — D631 Anemia in chronic kidney disease: Secondary | ICD-10-CM | POA: Diagnosis not present

## 2020-09-06 DIAGNOSIS — D509 Iron deficiency anemia, unspecified: Secondary | ICD-10-CM | POA: Diagnosis not present

## 2020-09-06 DIAGNOSIS — Z992 Dependence on renal dialysis: Secondary | ICD-10-CM | POA: Diagnosis not present

## 2020-09-07 DIAGNOSIS — Z992 Dependence on renal dialysis: Secondary | ICD-10-CM | POA: Diagnosis not present

## 2020-09-07 DIAGNOSIS — D631 Anemia in chronic kidney disease: Secondary | ICD-10-CM | POA: Diagnosis not present

## 2020-09-07 DIAGNOSIS — N186 End stage renal disease: Secondary | ICD-10-CM | POA: Diagnosis not present

## 2020-09-07 DIAGNOSIS — D509 Iron deficiency anemia, unspecified: Secondary | ICD-10-CM | POA: Diagnosis not present

## 2020-09-08 DIAGNOSIS — D509 Iron deficiency anemia, unspecified: Secondary | ICD-10-CM | POA: Diagnosis not present

## 2020-09-08 DIAGNOSIS — N186 End stage renal disease: Secondary | ICD-10-CM | POA: Diagnosis not present

## 2020-09-08 DIAGNOSIS — D631 Anemia in chronic kidney disease: Secondary | ICD-10-CM | POA: Diagnosis not present

## 2020-09-08 DIAGNOSIS — Z992 Dependence on renal dialysis: Secondary | ICD-10-CM | POA: Diagnosis not present

## 2020-09-09 DIAGNOSIS — D631 Anemia in chronic kidney disease: Secondary | ICD-10-CM | POA: Diagnosis not present

## 2020-09-09 DIAGNOSIS — D509 Iron deficiency anemia, unspecified: Secondary | ICD-10-CM | POA: Diagnosis not present

## 2020-09-09 DIAGNOSIS — N186 End stage renal disease: Secondary | ICD-10-CM | POA: Diagnosis not present

## 2020-09-09 DIAGNOSIS — Z992 Dependence on renal dialysis: Secondary | ICD-10-CM | POA: Diagnosis not present

## 2020-09-10 DIAGNOSIS — Z992 Dependence on renal dialysis: Secondary | ICD-10-CM | POA: Diagnosis not present

## 2020-09-10 DIAGNOSIS — N186 End stage renal disease: Secondary | ICD-10-CM | POA: Diagnosis not present

## 2020-09-10 DIAGNOSIS — D509 Iron deficiency anemia, unspecified: Secondary | ICD-10-CM | POA: Diagnosis not present

## 2020-09-10 DIAGNOSIS — D631 Anemia in chronic kidney disease: Secondary | ICD-10-CM | POA: Diagnosis not present

## 2020-09-11 DIAGNOSIS — D631 Anemia in chronic kidney disease: Secondary | ICD-10-CM | POA: Diagnosis not present

## 2020-09-11 DIAGNOSIS — Z992 Dependence on renal dialysis: Secondary | ICD-10-CM | POA: Diagnosis not present

## 2020-09-11 DIAGNOSIS — N186 End stage renal disease: Secondary | ICD-10-CM | POA: Diagnosis not present

## 2020-09-11 DIAGNOSIS — D509 Iron deficiency anemia, unspecified: Secondary | ICD-10-CM | POA: Diagnosis not present

## 2020-09-12 DIAGNOSIS — Z992 Dependence on renal dialysis: Secondary | ICD-10-CM | POA: Diagnosis not present

## 2020-09-12 DIAGNOSIS — N186 End stage renal disease: Secondary | ICD-10-CM | POA: Diagnosis not present

## 2020-09-12 DIAGNOSIS — D631 Anemia in chronic kidney disease: Secondary | ICD-10-CM | POA: Diagnosis not present

## 2020-09-12 DIAGNOSIS — D509 Iron deficiency anemia, unspecified: Secondary | ICD-10-CM | POA: Diagnosis not present

## 2020-09-13 DIAGNOSIS — N186 End stage renal disease: Secondary | ICD-10-CM | POA: Diagnosis not present

## 2020-09-13 DIAGNOSIS — D509 Iron deficiency anemia, unspecified: Secondary | ICD-10-CM | POA: Diagnosis not present

## 2020-09-13 DIAGNOSIS — Z992 Dependence on renal dialysis: Secondary | ICD-10-CM | POA: Diagnosis not present

## 2020-09-13 DIAGNOSIS — D631 Anemia in chronic kidney disease: Secondary | ICD-10-CM | POA: Diagnosis not present

## 2020-09-14 DIAGNOSIS — E1165 Type 2 diabetes mellitus with hyperglycemia: Secondary | ICD-10-CM | POA: Diagnosis not present

## 2020-09-14 DIAGNOSIS — Z79899 Other long term (current) drug therapy: Secondary | ICD-10-CM | POA: Diagnosis not present

## 2020-09-14 DIAGNOSIS — D631 Anemia in chronic kidney disease: Secondary | ICD-10-CM | POA: Diagnosis not present

## 2020-09-14 DIAGNOSIS — D509 Iron deficiency anemia, unspecified: Secondary | ICD-10-CM | POA: Diagnosis not present

## 2020-09-14 DIAGNOSIS — I10 Essential (primary) hypertension: Secondary | ICD-10-CM | POA: Diagnosis not present

## 2020-09-14 DIAGNOSIS — Z0001 Encounter for general adult medical examination with abnormal findings: Secondary | ICD-10-CM | POA: Diagnosis not present

## 2020-09-14 DIAGNOSIS — Z992 Dependence on renal dialysis: Secondary | ICD-10-CM | POA: Diagnosis not present

## 2020-09-14 DIAGNOSIS — N186 End stage renal disease: Secondary | ICD-10-CM | POA: Diagnosis not present

## 2020-09-14 DIAGNOSIS — Z6839 Body mass index (BMI) 39.0-39.9, adult: Secondary | ICD-10-CM | POA: Diagnosis not present

## 2020-09-15 DIAGNOSIS — D509 Iron deficiency anemia, unspecified: Secondary | ICD-10-CM | POA: Diagnosis not present

## 2020-09-15 DIAGNOSIS — Z992 Dependence on renal dialysis: Secondary | ICD-10-CM | POA: Diagnosis not present

## 2020-09-15 DIAGNOSIS — N186 End stage renal disease: Secondary | ICD-10-CM | POA: Diagnosis not present

## 2020-09-15 DIAGNOSIS — D631 Anemia in chronic kidney disease: Secondary | ICD-10-CM | POA: Diagnosis not present

## 2020-09-16 DIAGNOSIS — D631 Anemia in chronic kidney disease: Secondary | ICD-10-CM | POA: Diagnosis not present

## 2020-09-16 DIAGNOSIS — D509 Iron deficiency anemia, unspecified: Secondary | ICD-10-CM | POA: Diagnosis not present

## 2020-09-16 DIAGNOSIS — N186 End stage renal disease: Secondary | ICD-10-CM | POA: Diagnosis not present

## 2020-09-16 DIAGNOSIS — Z992 Dependence on renal dialysis: Secondary | ICD-10-CM | POA: Diagnosis not present

## 2020-09-17 DIAGNOSIS — Z992 Dependence on renal dialysis: Secondary | ICD-10-CM | POA: Diagnosis not present

## 2020-09-17 DIAGNOSIS — D631 Anemia in chronic kidney disease: Secondary | ICD-10-CM | POA: Diagnosis not present

## 2020-09-17 DIAGNOSIS — N186 End stage renal disease: Secondary | ICD-10-CM | POA: Diagnosis not present

## 2020-09-17 DIAGNOSIS — D509 Iron deficiency anemia, unspecified: Secondary | ICD-10-CM | POA: Diagnosis not present

## 2020-09-18 DIAGNOSIS — Z992 Dependence on renal dialysis: Secondary | ICD-10-CM | POA: Diagnosis not present

## 2020-09-18 DIAGNOSIS — D509 Iron deficiency anemia, unspecified: Secondary | ICD-10-CM | POA: Diagnosis not present

## 2020-09-18 DIAGNOSIS — D631 Anemia in chronic kidney disease: Secondary | ICD-10-CM | POA: Diagnosis not present

## 2020-09-18 DIAGNOSIS — N186 End stage renal disease: Secondary | ICD-10-CM | POA: Diagnosis not present

## 2020-09-19 DIAGNOSIS — N186 End stage renal disease: Secondary | ICD-10-CM | POA: Diagnosis not present

## 2020-09-19 DIAGNOSIS — Z992 Dependence on renal dialysis: Secondary | ICD-10-CM | POA: Diagnosis not present

## 2020-09-19 DIAGNOSIS — D509 Iron deficiency anemia, unspecified: Secondary | ICD-10-CM | POA: Diagnosis not present

## 2020-09-19 DIAGNOSIS — D631 Anemia in chronic kidney disease: Secondary | ICD-10-CM | POA: Diagnosis not present

## 2020-09-20 DIAGNOSIS — Z992 Dependence on renal dialysis: Secondary | ICD-10-CM | POA: Diagnosis not present

## 2020-09-20 DIAGNOSIS — D631 Anemia in chronic kidney disease: Secondary | ICD-10-CM | POA: Diagnosis not present

## 2020-09-20 DIAGNOSIS — N186 End stage renal disease: Secondary | ICD-10-CM | POA: Diagnosis not present

## 2020-09-20 DIAGNOSIS — D509 Iron deficiency anemia, unspecified: Secondary | ICD-10-CM | POA: Diagnosis not present

## 2020-09-21 DIAGNOSIS — D631 Anemia in chronic kidney disease: Secondary | ICD-10-CM | POA: Diagnosis not present

## 2020-09-21 DIAGNOSIS — B351 Tinea unguium: Secondary | ICD-10-CM | POA: Diagnosis not present

## 2020-09-21 DIAGNOSIS — N186 End stage renal disease: Secondary | ICD-10-CM | POA: Diagnosis not present

## 2020-09-21 DIAGNOSIS — D509 Iron deficiency anemia, unspecified: Secondary | ICD-10-CM | POA: Diagnosis not present

## 2020-09-21 DIAGNOSIS — E1142 Type 2 diabetes mellitus with diabetic polyneuropathy: Secondary | ICD-10-CM | POA: Diagnosis not present

## 2020-09-21 DIAGNOSIS — Z992 Dependence on renal dialysis: Secondary | ICD-10-CM | POA: Diagnosis not present

## 2020-09-22 DIAGNOSIS — Z992 Dependence on renal dialysis: Secondary | ICD-10-CM | POA: Diagnosis not present

## 2020-09-22 DIAGNOSIS — D631 Anemia in chronic kidney disease: Secondary | ICD-10-CM | POA: Diagnosis not present

## 2020-09-22 DIAGNOSIS — D509 Iron deficiency anemia, unspecified: Secondary | ICD-10-CM | POA: Diagnosis not present

## 2020-09-22 DIAGNOSIS — N186 End stage renal disease: Secondary | ICD-10-CM | POA: Diagnosis not present

## 2020-09-23 DIAGNOSIS — Z992 Dependence on renal dialysis: Secondary | ICD-10-CM | POA: Diagnosis not present

## 2020-09-23 DIAGNOSIS — D509 Iron deficiency anemia, unspecified: Secondary | ICD-10-CM | POA: Diagnosis not present

## 2020-09-23 DIAGNOSIS — N186 End stage renal disease: Secondary | ICD-10-CM | POA: Diagnosis not present

## 2020-09-23 DIAGNOSIS — D631 Anemia in chronic kidney disease: Secondary | ICD-10-CM | POA: Diagnosis not present

## 2020-09-24 DIAGNOSIS — Z992 Dependence on renal dialysis: Secondary | ICD-10-CM | POA: Diagnosis not present

## 2020-09-24 DIAGNOSIS — D509 Iron deficiency anemia, unspecified: Secondary | ICD-10-CM | POA: Diagnosis not present

## 2020-09-24 DIAGNOSIS — D631 Anemia in chronic kidney disease: Secondary | ICD-10-CM | POA: Diagnosis not present

## 2020-09-24 DIAGNOSIS — N186 End stage renal disease: Secondary | ICD-10-CM | POA: Diagnosis not present

## 2020-09-25 DIAGNOSIS — D631 Anemia in chronic kidney disease: Secondary | ICD-10-CM | POA: Diagnosis not present

## 2020-09-25 DIAGNOSIS — Z992 Dependence on renal dialysis: Secondary | ICD-10-CM | POA: Diagnosis not present

## 2020-09-25 DIAGNOSIS — N186 End stage renal disease: Secondary | ICD-10-CM | POA: Diagnosis not present

## 2020-09-25 DIAGNOSIS — D509 Iron deficiency anemia, unspecified: Secondary | ICD-10-CM | POA: Diagnosis not present

## 2020-09-26 DIAGNOSIS — Z992 Dependence on renal dialysis: Secondary | ICD-10-CM | POA: Diagnosis not present

## 2020-09-26 DIAGNOSIS — N186 End stage renal disease: Secondary | ICD-10-CM | POA: Diagnosis not present

## 2020-09-26 DIAGNOSIS — D631 Anemia in chronic kidney disease: Secondary | ICD-10-CM | POA: Diagnosis not present

## 2020-09-26 DIAGNOSIS — D509 Iron deficiency anemia, unspecified: Secondary | ICD-10-CM | POA: Diagnosis not present

## 2020-09-27 DIAGNOSIS — N186 End stage renal disease: Secondary | ICD-10-CM | POA: Diagnosis not present

## 2020-09-27 DIAGNOSIS — D509 Iron deficiency anemia, unspecified: Secondary | ICD-10-CM | POA: Diagnosis not present

## 2020-09-27 DIAGNOSIS — D631 Anemia in chronic kidney disease: Secondary | ICD-10-CM | POA: Diagnosis not present

## 2020-09-27 DIAGNOSIS — Z992 Dependence on renal dialysis: Secondary | ICD-10-CM | POA: Diagnosis not present

## 2020-09-28 DIAGNOSIS — D631 Anemia in chronic kidney disease: Secondary | ICD-10-CM | POA: Diagnosis not present

## 2020-09-28 DIAGNOSIS — N186 End stage renal disease: Secondary | ICD-10-CM | POA: Diagnosis not present

## 2020-09-28 DIAGNOSIS — Z992 Dependence on renal dialysis: Secondary | ICD-10-CM | POA: Diagnosis not present

## 2020-09-28 DIAGNOSIS — D509 Iron deficiency anemia, unspecified: Secondary | ICD-10-CM | POA: Diagnosis not present

## 2020-09-29 DIAGNOSIS — D631 Anemia in chronic kidney disease: Secondary | ICD-10-CM | POA: Diagnosis not present

## 2020-09-29 DIAGNOSIS — D509 Iron deficiency anemia, unspecified: Secondary | ICD-10-CM | POA: Diagnosis not present

## 2020-09-29 DIAGNOSIS — N186 End stage renal disease: Secondary | ICD-10-CM | POA: Diagnosis not present

## 2020-09-29 DIAGNOSIS — Z992 Dependence on renal dialysis: Secondary | ICD-10-CM | POA: Diagnosis not present

## 2020-09-30 DIAGNOSIS — Z992 Dependence on renal dialysis: Secondary | ICD-10-CM | POA: Diagnosis not present

## 2020-09-30 DIAGNOSIS — D509 Iron deficiency anemia, unspecified: Secondary | ICD-10-CM | POA: Diagnosis not present

## 2020-09-30 DIAGNOSIS — N186 End stage renal disease: Secondary | ICD-10-CM | POA: Diagnosis not present

## 2020-09-30 DIAGNOSIS — D631 Anemia in chronic kidney disease: Secondary | ICD-10-CM | POA: Diagnosis not present

## 2020-10-01 DIAGNOSIS — D631 Anemia in chronic kidney disease: Secondary | ICD-10-CM | POA: Diagnosis not present

## 2020-10-01 DIAGNOSIS — D509 Iron deficiency anemia, unspecified: Secondary | ICD-10-CM | POA: Diagnosis not present

## 2020-10-01 DIAGNOSIS — N186 End stage renal disease: Secondary | ICD-10-CM | POA: Diagnosis not present

## 2020-10-01 DIAGNOSIS — Z992 Dependence on renal dialysis: Secondary | ICD-10-CM | POA: Diagnosis not present

## 2020-10-02 DIAGNOSIS — D509 Iron deficiency anemia, unspecified: Secondary | ICD-10-CM | POA: Diagnosis not present

## 2020-10-02 DIAGNOSIS — D631 Anemia in chronic kidney disease: Secondary | ICD-10-CM | POA: Diagnosis not present

## 2020-10-02 DIAGNOSIS — Z992 Dependence on renal dialysis: Secondary | ICD-10-CM | POA: Diagnosis not present

## 2020-10-02 DIAGNOSIS — N186 End stage renal disease: Secondary | ICD-10-CM | POA: Diagnosis not present

## 2020-10-03 DIAGNOSIS — D631 Anemia in chronic kidney disease: Secondary | ICD-10-CM | POA: Diagnosis not present

## 2020-10-03 DIAGNOSIS — Z992 Dependence on renal dialysis: Secondary | ICD-10-CM | POA: Diagnosis not present

## 2020-10-03 DIAGNOSIS — D509 Iron deficiency anemia, unspecified: Secondary | ICD-10-CM | POA: Diagnosis not present

## 2020-10-03 DIAGNOSIS — N186 End stage renal disease: Secondary | ICD-10-CM | POA: Diagnosis not present

## 2020-10-04 DIAGNOSIS — N186 End stage renal disease: Secondary | ICD-10-CM | POA: Diagnosis not present

## 2020-10-04 DIAGNOSIS — Z992 Dependence on renal dialysis: Secondary | ICD-10-CM | POA: Diagnosis not present

## 2020-10-04 DIAGNOSIS — D509 Iron deficiency anemia, unspecified: Secondary | ICD-10-CM | POA: Diagnosis not present

## 2020-10-04 DIAGNOSIS — D631 Anemia in chronic kidney disease: Secondary | ICD-10-CM | POA: Diagnosis not present

## 2020-10-05 DIAGNOSIS — D631 Anemia in chronic kidney disease: Secondary | ICD-10-CM | POA: Diagnosis not present

## 2020-10-05 DIAGNOSIS — Z992 Dependence on renal dialysis: Secondary | ICD-10-CM | POA: Diagnosis not present

## 2020-10-05 DIAGNOSIS — D509 Iron deficiency anemia, unspecified: Secondary | ICD-10-CM | POA: Diagnosis not present

## 2020-10-05 DIAGNOSIS — N186 End stage renal disease: Secondary | ICD-10-CM | POA: Diagnosis not present

## 2020-10-06 DIAGNOSIS — D631 Anemia in chronic kidney disease: Secondary | ICD-10-CM | POA: Diagnosis not present

## 2020-10-06 DIAGNOSIS — N186 End stage renal disease: Secondary | ICD-10-CM | POA: Diagnosis not present

## 2020-10-06 DIAGNOSIS — D509 Iron deficiency anemia, unspecified: Secondary | ICD-10-CM | POA: Diagnosis not present

## 2020-10-06 DIAGNOSIS — Z992 Dependence on renal dialysis: Secondary | ICD-10-CM | POA: Diagnosis not present

## 2020-10-07 DIAGNOSIS — D509 Iron deficiency anemia, unspecified: Secondary | ICD-10-CM | POA: Diagnosis not present

## 2020-10-07 DIAGNOSIS — Z992 Dependence on renal dialysis: Secondary | ICD-10-CM | POA: Diagnosis not present

## 2020-10-07 DIAGNOSIS — N186 End stage renal disease: Secondary | ICD-10-CM | POA: Diagnosis not present

## 2020-10-07 DIAGNOSIS — D631 Anemia in chronic kidney disease: Secondary | ICD-10-CM | POA: Diagnosis not present

## 2020-10-08 DIAGNOSIS — D631 Anemia in chronic kidney disease: Secondary | ICD-10-CM | POA: Diagnosis not present

## 2020-10-08 DIAGNOSIS — D509 Iron deficiency anemia, unspecified: Secondary | ICD-10-CM | POA: Diagnosis not present

## 2020-10-08 DIAGNOSIS — N186 End stage renal disease: Secondary | ICD-10-CM | POA: Diagnosis not present

## 2020-10-08 DIAGNOSIS — Z992 Dependence on renal dialysis: Secondary | ICD-10-CM | POA: Diagnosis not present

## 2020-10-09 DIAGNOSIS — D509 Iron deficiency anemia, unspecified: Secondary | ICD-10-CM | POA: Diagnosis not present

## 2020-10-09 DIAGNOSIS — Z992 Dependence on renal dialysis: Secondary | ICD-10-CM | POA: Diagnosis not present

## 2020-10-09 DIAGNOSIS — D631 Anemia in chronic kidney disease: Secondary | ICD-10-CM | POA: Diagnosis not present

## 2020-10-09 DIAGNOSIS — N186 End stage renal disease: Secondary | ICD-10-CM | POA: Diagnosis not present

## 2020-10-10 DIAGNOSIS — Z992 Dependence on renal dialysis: Secondary | ICD-10-CM | POA: Diagnosis not present

## 2020-10-10 DIAGNOSIS — D631 Anemia in chronic kidney disease: Secondary | ICD-10-CM | POA: Diagnosis not present

## 2020-10-10 DIAGNOSIS — N186 End stage renal disease: Secondary | ICD-10-CM | POA: Diagnosis not present

## 2020-10-10 DIAGNOSIS — D509 Iron deficiency anemia, unspecified: Secondary | ICD-10-CM | POA: Diagnosis not present

## 2020-10-11 ENCOUNTER — Ambulatory Visit: Payer: Medicare Other | Admitting: Orthopedic Surgery

## 2020-10-11 DIAGNOSIS — D631 Anemia in chronic kidney disease: Secondary | ICD-10-CM | POA: Diagnosis not present

## 2020-10-11 DIAGNOSIS — N186 End stage renal disease: Secondary | ICD-10-CM | POA: Diagnosis not present

## 2020-10-11 DIAGNOSIS — D509 Iron deficiency anemia, unspecified: Secondary | ICD-10-CM | POA: Diagnosis not present

## 2020-10-11 DIAGNOSIS — Z992 Dependence on renal dialysis: Secondary | ICD-10-CM | POA: Diagnosis not present

## 2020-10-12 DIAGNOSIS — D631 Anemia in chronic kidney disease: Secondary | ICD-10-CM | POA: Diagnosis not present

## 2020-10-12 DIAGNOSIS — D509 Iron deficiency anemia, unspecified: Secondary | ICD-10-CM | POA: Diagnosis not present

## 2020-10-12 DIAGNOSIS — N186 End stage renal disease: Secondary | ICD-10-CM | POA: Diagnosis not present

## 2020-10-12 DIAGNOSIS — Z992 Dependence on renal dialysis: Secondary | ICD-10-CM | POA: Diagnosis not present

## 2020-10-13 DIAGNOSIS — Z992 Dependence on renal dialysis: Secondary | ICD-10-CM | POA: Diagnosis not present

## 2020-10-13 DIAGNOSIS — D509 Iron deficiency anemia, unspecified: Secondary | ICD-10-CM | POA: Diagnosis not present

## 2020-10-13 DIAGNOSIS — N186 End stage renal disease: Secondary | ICD-10-CM | POA: Diagnosis not present

## 2020-10-13 DIAGNOSIS — D631 Anemia in chronic kidney disease: Secondary | ICD-10-CM | POA: Diagnosis not present

## 2020-10-14 DIAGNOSIS — D509 Iron deficiency anemia, unspecified: Secondary | ICD-10-CM | POA: Diagnosis not present

## 2020-10-14 DIAGNOSIS — D631 Anemia in chronic kidney disease: Secondary | ICD-10-CM | POA: Diagnosis not present

## 2020-10-14 DIAGNOSIS — Z992 Dependence on renal dialysis: Secondary | ICD-10-CM | POA: Diagnosis not present

## 2020-10-14 DIAGNOSIS — N186 End stage renal disease: Secondary | ICD-10-CM | POA: Diagnosis not present

## 2020-10-15 DIAGNOSIS — Z992 Dependence on renal dialysis: Secondary | ICD-10-CM | POA: Diagnosis not present

## 2020-10-15 DIAGNOSIS — N186 End stage renal disease: Secondary | ICD-10-CM | POA: Diagnosis not present

## 2020-10-15 DIAGNOSIS — D631 Anemia in chronic kidney disease: Secondary | ICD-10-CM | POA: Diagnosis not present

## 2020-10-15 DIAGNOSIS — N2581 Secondary hyperparathyroidism of renal origin: Secondary | ICD-10-CM | POA: Diagnosis not present

## 2020-10-15 DIAGNOSIS — D509 Iron deficiency anemia, unspecified: Secondary | ICD-10-CM | POA: Diagnosis not present

## 2020-10-16 DIAGNOSIS — Z992 Dependence on renal dialysis: Secondary | ICD-10-CM | POA: Diagnosis not present

## 2020-10-16 DIAGNOSIS — D509 Iron deficiency anemia, unspecified: Secondary | ICD-10-CM | POA: Diagnosis not present

## 2020-10-16 DIAGNOSIS — N2581 Secondary hyperparathyroidism of renal origin: Secondary | ICD-10-CM | POA: Diagnosis not present

## 2020-10-16 DIAGNOSIS — D631 Anemia in chronic kidney disease: Secondary | ICD-10-CM | POA: Diagnosis not present

## 2020-10-16 DIAGNOSIS — N186 End stage renal disease: Secondary | ICD-10-CM | POA: Diagnosis not present

## 2020-10-17 DIAGNOSIS — N2581 Secondary hyperparathyroidism of renal origin: Secondary | ICD-10-CM | POA: Diagnosis not present

## 2020-10-17 DIAGNOSIS — N186 End stage renal disease: Secondary | ICD-10-CM | POA: Diagnosis not present

## 2020-10-17 DIAGNOSIS — D509 Iron deficiency anemia, unspecified: Secondary | ICD-10-CM | POA: Diagnosis not present

## 2020-10-17 DIAGNOSIS — Z992 Dependence on renal dialysis: Secondary | ICD-10-CM | POA: Diagnosis not present

## 2020-10-17 DIAGNOSIS — D631 Anemia in chronic kidney disease: Secondary | ICD-10-CM | POA: Diagnosis not present

## 2020-10-18 ENCOUNTER — Other Ambulatory Visit: Payer: Self-pay | Admitting: "Endocrinology

## 2020-10-18 DIAGNOSIS — N186 End stage renal disease: Secondary | ICD-10-CM | POA: Diagnosis not present

## 2020-10-18 DIAGNOSIS — N2581 Secondary hyperparathyroidism of renal origin: Secondary | ICD-10-CM | POA: Diagnosis not present

## 2020-10-18 DIAGNOSIS — D509 Iron deficiency anemia, unspecified: Secondary | ICD-10-CM | POA: Diagnosis not present

## 2020-10-18 DIAGNOSIS — D631 Anemia in chronic kidney disease: Secondary | ICD-10-CM | POA: Diagnosis not present

## 2020-10-18 DIAGNOSIS — Z992 Dependence on renal dialysis: Secondary | ICD-10-CM | POA: Diagnosis not present

## 2020-10-19 DIAGNOSIS — N2581 Secondary hyperparathyroidism of renal origin: Secondary | ICD-10-CM | POA: Diagnosis not present

## 2020-10-19 DIAGNOSIS — D631 Anemia in chronic kidney disease: Secondary | ICD-10-CM | POA: Diagnosis not present

## 2020-10-19 DIAGNOSIS — Z794 Long term (current) use of insulin: Secondary | ICD-10-CM | POA: Diagnosis not present

## 2020-10-19 DIAGNOSIS — E119 Type 2 diabetes mellitus without complications: Secondary | ICD-10-CM | POA: Diagnosis not present

## 2020-10-19 DIAGNOSIS — D509 Iron deficiency anemia, unspecified: Secondary | ICD-10-CM | POA: Diagnosis not present

## 2020-10-19 DIAGNOSIS — N186 End stage renal disease: Secondary | ICD-10-CM | POA: Diagnosis not present

## 2020-10-19 DIAGNOSIS — Z992 Dependence on renal dialysis: Secondary | ICD-10-CM | POA: Diagnosis not present

## 2020-10-20 DIAGNOSIS — N2581 Secondary hyperparathyroidism of renal origin: Secondary | ICD-10-CM | POA: Diagnosis not present

## 2020-10-20 DIAGNOSIS — N186 End stage renal disease: Secondary | ICD-10-CM | POA: Diagnosis not present

## 2020-10-20 DIAGNOSIS — D631 Anemia in chronic kidney disease: Secondary | ICD-10-CM | POA: Diagnosis not present

## 2020-10-20 DIAGNOSIS — Z992 Dependence on renal dialysis: Secondary | ICD-10-CM | POA: Diagnosis not present

## 2020-10-20 DIAGNOSIS — D509 Iron deficiency anemia, unspecified: Secondary | ICD-10-CM | POA: Diagnosis not present

## 2020-10-21 DIAGNOSIS — D509 Iron deficiency anemia, unspecified: Secondary | ICD-10-CM | POA: Diagnosis not present

## 2020-10-21 DIAGNOSIS — N2581 Secondary hyperparathyroidism of renal origin: Secondary | ICD-10-CM | POA: Diagnosis not present

## 2020-10-21 DIAGNOSIS — Z992 Dependence on renal dialysis: Secondary | ICD-10-CM | POA: Diagnosis not present

## 2020-10-21 DIAGNOSIS — N186 End stage renal disease: Secondary | ICD-10-CM | POA: Diagnosis not present

## 2020-10-21 DIAGNOSIS — D631 Anemia in chronic kidney disease: Secondary | ICD-10-CM | POA: Diagnosis not present

## 2020-10-22 DIAGNOSIS — D509 Iron deficiency anemia, unspecified: Secondary | ICD-10-CM | POA: Diagnosis not present

## 2020-10-22 DIAGNOSIS — Z992 Dependence on renal dialysis: Secondary | ICD-10-CM | POA: Diagnosis not present

## 2020-10-22 DIAGNOSIS — N2581 Secondary hyperparathyroidism of renal origin: Secondary | ICD-10-CM | POA: Diagnosis not present

## 2020-10-22 DIAGNOSIS — N186 End stage renal disease: Secondary | ICD-10-CM | POA: Diagnosis not present

## 2020-10-22 DIAGNOSIS — D631 Anemia in chronic kidney disease: Secondary | ICD-10-CM | POA: Diagnosis not present

## 2020-10-23 DIAGNOSIS — D631 Anemia in chronic kidney disease: Secondary | ICD-10-CM | POA: Diagnosis not present

## 2020-10-23 DIAGNOSIS — D509 Iron deficiency anemia, unspecified: Secondary | ICD-10-CM | POA: Diagnosis not present

## 2020-10-23 DIAGNOSIS — Z992 Dependence on renal dialysis: Secondary | ICD-10-CM | POA: Diagnosis not present

## 2020-10-23 DIAGNOSIS — N186 End stage renal disease: Secondary | ICD-10-CM | POA: Diagnosis not present

## 2020-10-23 DIAGNOSIS — N2581 Secondary hyperparathyroidism of renal origin: Secondary | ICD-10-CM | POA: Diagnosis not present

## 2020-10-24 DIAGNOSIS — D631 Anemia in chronic kidney disease: Secondary | ICD-10-CM | POA: Diagnosis not present

## 2020-10-24 DIAGNOSIS — N2581 Secondary hyperparathyroidism of renal origin: Secondary | ICD-10-CM | POA: Diagnosis not present

## 2020-10-24 DIAGNOSIS — N186 End stage renal disease: Secondary | ICD-10-CM | POA: Diagnosis not present

## 2020-10-24 DIAGNOSIS — Z992 Dependence on renal dialysis: Secondary | ICD-10-CM | POA: Diagnosis not present

## 2020-10-24 DIAGNOSIS — D509 Iron deficiency anemia, unspecified: Secondary | ICD-10-CM | POA: Diagnosis not present

## 2020-10-25 ENCOUNTER — Other Ambulatory Visit: Payer: Self-pay

## 2020-10-25 ENCOUNTER — Ambulatory Visit (INDEPENDENT_AMBULATORY_CARE_PROVIDER_SITE_OTHER): Payer: Medicare Other | Admitting: Orthopedic Surgery

## 2020-10-25 ENCOUNTER — Encounter: Payer: Self-pay | Admitting: Orthopedic Surgery

## 2020-10-25 VITALS — BP 122/75 | HR 82 | Ht 64.0 in | Wt 236.0 lb

## 2020-10-25 DIAGNOSIS — N186 End stage renal disease: Secondary | ICD-10-CM | POA: Diagnosis not present

## 2020-10-25 DIAGNOSIS — M25512 Pain in left shoulder: Secondary | ICD-10-CM | POA: Diagnosis not present

## 2020-10-25 DIAGNOSIS — G8929 Other chronic pain: Secondary | ICD-10-CM | POA: Diagnosis not present

## 2020-10-25 DIAGNOSIS — Z992 Dependence on renal dialysis: Secondary | ICD-10-CM | POA: Diagnosis not present

## 2020-10-25 DIAGNOSIS — N2581 Secondary hyperparathyroidism of renal origin: Secondary | ICD-10-CM | POA: Diagnosis not present

## 2020-10-25 DIAGNOSIS — D509 Iron deficiency anemia, unspecified: Secondary | ICD-10-CM | POA: Diagnosis not present

## 2020-10-25 DIAGNOSIS — D631 Anemia in chronic kidney disease: Secondary | ICD-10-CM | POA: Diagnosis not present

## 2020-10-25 DIAGNOSIS — M7531 Calcific tendinitis of right shoulder: Secondary | ICD-10-CM | POA: Diagnosis not present

## 2020-10-25 NOTE — Patient Instructions (Signed)
 Shoulder Impingement Syndrome  Shoulder impingement syndrome is a condition that causes pain when connective tissues (tendons) surrounding the shoulder joint become pinched. These tendons are part of the group of muscles and tissues that help to stabilize the shoulder (rotator cuff). Beneath the rotator cuff is a fluid-filled sac (bursa) that allows the muscles and tendons to glide smoothly. The bursa may become swollen or irritated (bursitis). Bursitis, swelling in the rotator cuff tendons, or both conditions can decrease how much space is under a bone in the shoulder joint (acromion), resulting in impingement. What are the causes? Shoulder impingement syndrome may be caused by bursitis or swelling of the rotator cuff tendons, which may result from:  Repetitive overhead arm movements.  Falling onto the shoulder.  Weakness in the shoulder muscles. What increases the risk? You may be more likely to develop this condition if you:  Play sports that involve throwing, such as baseball.  Participate in sports such as tennis, volleyball, and swimming.  Work as a painter, carpenter, or fruit picker. Some people are also more likely to develop impingement syndrome because of the shape of their acromion bone. What are the signs or symptoms? The main symptom of this condition is pain on the front or side of the shoulder. The pain may:  Get worse when lifting or raising the arm.  Get worse at night.  Wake you up from sleeping.  Feel sharp when the shoulder is moved and then fade to an ache. Other symptoms may include:  Tenderness.  Stiffness.  Inability to raise the arm above shoulder level or behind the body.  Weakness. How is this diagnosed? This condition may be diagnosed based on:  Your symptoms and medical history.  A physical exam.  Imaging tests, such as: ? X-rays. ? MRI. ? Ultrasound. How is this treated? This condition may be treated by:  Resting your shoulder  and avoiding all activities that cause pain or put stress on the shoulder.  Icing your shoulder.  NSAIDs to help reduce pain and swelling.  One or more injections of medicines to numb the area and reduce inflammation.  Physical therapy.  Surgery. This may be needed if nonsurgical treatments have not helped. Surgery may involve repairing the rotator cuff, reshaping the acromion, or removing the bursa. Follow these instructions at home: Managing pain, stiffness, and swelling  If directed, put ice on the injured area. ? Put ice in a plastic bag. ? Place a towel between your skin and the bag. ? Leave the ice on for 20 minutes, 2-3 times a day.   Activity  Rest and return to your normal activities as told by your health care provider. Ask your health care provider what activities are safe for you.  Do exercises as told by your health care provider. General instructions  Do not use any products that contain nicotine or tobacco, such as cigarettes, e-cigarettes, and chewing tobacco. These can delay healing. If you need help quitting, ask your health care provider.  Ask your health care provider when it is safe for you to drive.  Take over-the-counter and prescription medicines only as told by your health care provider.  Keep all follow-up visits as told by your health care provider. This is important. How is this prevented?  Give your body time to rest between periods of activity.  Be safe and responsible while being active. This will help you avoid falls.  Maintain physical fitness, including strength and flexibility. Contact a health care provider if:    Your symptoms have not improved after 1-2 months of treatment and rest.  You cannot lift your arm away from your body. Summary  Shoulder impingement syndrome is a condition that causes pain when connective tissues (tendons) surrounding the shoulder joint become pinched.  The main symptom of this condition is pain on the front or  side of the shoulder.  This condition is usually treated with rest, ice, and pain medicines as needed. This information is not intended to replace advice given to you by your health care provider. Make sure you discuss any questions you have with your health care provider. Document Revised: 10/25/2018 Document Reviewed: 12/26/2017 Elsevier Patient Education  2021 [place].  Shoulder Impingement Syndrome  Shoulder impingement syndrome is a condition that causes pain when connective tissues (tendons) surrounding the shoulder joint become pinched. These tendons are part of the group of muscles and tissues that help to stabilize the shoulder (rotator cuff). Beneath the rotator cuff is a fluid-filled sac (bursa) that allows the muscles and tendons to glide smoothly. The bursa may become swollen or irritated (bursitis). Bursitis, swelling in the rotator cuff tendons, or both conditions can decrease how much space is under a bone in the shoulder joint (acromion), resulting in impingement. What are the causes? Shoulder impingement syndrome may be caused by bursitis or swelling of the rotator cuff tendons, which may result from:  Repetitive overhead arm movements.  Falling onto the shoulder.  Weakness in the shoulder muscles. What increases the risk? You may be more likely to develop this condition if you:  Play sports that involve throwing, such as baseball.  Participate in sports such as tennis, volleyball, and swimming.  Work as a Curator, Games developer, or Architect. Some people are also more likely to develop impingement syndrome because of the shape of their acromion bone. What are the signs or symptoms? The main symptom of this condition is pain on the front or side of the shoulder. The pain may:  Get worse when lifting or raising the arm.  Get worse at night.  Wake you up from sleeping.  Feel sharp when the shoulder is moved and then fade to an ache. Other symptoms may  include:  Tenderness.  Stiffness.  Inability to raise the arm above shoulder level or behind the body.  Weakness. How is this diagnosed? This condition may be diagnosed based on:  Your symptoms and medical history.  A physical exam.  Imaging tests, such as: ? X-rays. ? MRI. ? Ultrasound. How is this treated? This condition may be treated by:  Resting your shoulder and avoiding all activities that cause pain or put stress on the shoulder.  Icing your shoulder.  NSAIDs to help reduce pain and swelling.  One or more injections of medicines to numb the area and reduce inflammation.  Physical therapy.  Surgery. This may be needed if nonsurgical treatments have not helped. Surgery may involve repairing the rotator cuff, reshaping the acromion, or removing the bursa. Follow these instructions at home: Managing pain, stiffness, and swelling  If directed, put ice on the injured area. ? Put ice in a plastic bag. ? Place a towel between your skin and the bag. ? Leave the ice on for 20 minutes, 2-3 times a day.   Activity  Rest and return to your normal activities as told by your health care provider. Ask your health care provider what activities are safe for you.  Do exercises as told by your health care provider. General instructions  Do not use any products that contain nicotine or tobacco, such as cigarettes, e-cigarettes, and chewing tobacco. These can delay healing. If you need help quitting, ask your health care provider.  Ask your health care provider when it is safe for you to drive.  Take over-the-counter and prescription medicines only as told by your health care provider.  Keep all follow-up visits as told by your health care provider. This is important. How is this prevented?  Give your body time to rest between periods of activity.  Be safe and responsible while being active. This will help you avoid falls.  Maintain physical fitness, including strength  and flexibility. Contact a health care provider if:  Your symptoms have not improved after 1-2 months of treatment and rest.  You cannot lift your arm away from your body. Summary  Shoulder impingement syndrome is a condition that causes pain when connective tissues (tendons) surrounding the shoulder joint become pinched.  The main symptom of this condition is pain on the front or side of the shoulder.  This condition is usually treated with rest, ice, and pain medicines as needed. This information is not intended to replace advice given to you by your health care provider. Make sure you discuss any questions you have with your health care provider. Document Revised: 10/25/2018 Document Reviewed: 12/26/2017 Elsevier Patient Education  2021 [employer].

## 2020-10-25 NOTE — Progress Notes (Signed)
Chief Complaint  Patient presents with  . Shoulder Pain    F/u feels better today    [name] [name] has had MRI of his left shoulder there is no surgical tear  He complains of bilateral shoulder pain somewhat improved today says he has pain at a certain position of the arm motion  This is primarily in abduction and flexion at 120 degrees  He has excellent strength in both shoulders  At this point I do not think surgery would help in any  I have encouraged him to do a good exercise program including [place] exercises and cuff strengthening exercises to improve his shoulder mechanics and he should do fine with that  Follow-up as needed  Encounter Diagnoses  Name Primary?  . Calcific tendinitis of right shoulder Yes  . Chronic left shoulder pain

## 2020-10-26 DIAGNOSIS — N186 End stage renal disease: Secondary | ICD-10-CM | POA: Diagnosis not present

## 2020-10-26 DIAGNOSIS — D509 Iron deficiency anemia, unspecified: Secondary | ICD-10-CM | POA: Diagnosis not present

## 2020-10-26 DIAGNOSIS — Z992 Dependence on renal dialysis: Secondary | ICD-10-CM | POA: Diagnosis not present

## 2020-10-26 DIAGNOSIS — D631 Anemia in chronic kidney disease: Secondary | ICD-10-CM | POA: Diagnosis not present

## 2020-10-26 DIAGNOSIS — N2581 Secondary hyperparathyroidism of renal origin: Secondary | ICD-10-CM | POA: Diagnosis not present

## 2020-10-27 DIAGNOSIS — D509 Iron deficiency anemia, unspecified: Secondary | ICD-10-CM | POA: Diagnosis not present

## 2020-10-27 DIAGNOSIS — Z992 Dependence on renal dialysis: Secondary | ICD-10-CM | POA: Diagnosis not present

## 2020-10-27 DIAGNOSIS — N2581 Secondary hyperparathyroidism of renal origin: Secondary | ICD-10-CM | POA: Diagnosis not present

## 2020-10-27 DIAGNOSIS — N186 End stage renal disease: Secondary | ICD-10-CM | POA: Diagnosis not present

## 2020-10-27 DIAGNOSIS — D631 Anemia in chronic kidney disease: Secondary | ICD-10-CM | POA: Diagnosis not present

## 2020-10-28 DIAGNOSIS — N2581 Secondary hyperparathyroidism of renal origin: Secondary | ICD-10-CM | POA: Diagnosis not present

## 2020-10-28 DIAGNOSIS — D509 Iron deficiency anemia, unspecified: Secondary | ICD-10-CM | POA: Diagnosis not present

## 2020-10-28 DIAGNOSIS — Z992 Dependence on renal dialysis: Secondary | ICD-10-CM | POA: Diagnosis not present

## 2020-10-28 DIAGNOSIS — D631 Anemia in chronic kidney disease: Secondary | ICD-10-CM | POA: Diagnosis not present

## 2020-10-28 DIAGNOSIS — N186 End stage renal disease: Secondary | ICD-10-CM | POA: Diagnosis not present

## 2020-10-29 DIAGNOSIS — N2581 Secondary hyperparathyroidism of renal origin: Secondary | ICD-10-CM | POA: Diagnosis not present

## 2020-10-29 DIAGNOSIS — D509 Iron deficiency anemia, unspecified: Secondary | ICD-10-CM | POA: Diagnosis not present

## 2020-10-29 DIAGNOSIS — N186 End stage renal disease: Secondary | ICD-10-CM | POA: Diagnosis not present

## 2020-10-29 DIAGNOSIS — D631 Anemia in chronic kidney disease: Secondary | ICD-10-CM | POA: Diagnosis not present

## 2020-10-29 DIAGNOSIS — Z992 Dependence on renal dialysis: Secondary | ICD-10-CM | POA: Diagnosis not present

## 2020-10-30 DIAGNOSIS — D631 Anemia in chronic kidney disease: Secondary | ICD-10-CM | POA: Diagnosis not present

## 2020-10-30 DIAGNOSIS — D509 Iron deficiency anemia, unspecified: Secondary | ICD-10-CM | POA: Diagnosis not present

## 2020-10-30 DIAGNOSIS — Z992 Dependence on renal dialysis: Secondary | ICD-10-CM | POA: Diagnosis not present

## 2020-10-30 DIAGNOSIS — N2581 Secondary hyperparathyroidism of renal origin: Secondary | ICD-10-CM | POA: Diagnosis not present

## 2020-10-30 DIAGNOSIS — N186 End stage renal disease: Secondary | ICD-10-CM | POA: Diagnosis not present

## 2020-10-31 DIAGNOSIS — D631 Anemia in chronic kidney disease: Secondary | ICD-10-CM | POA: Diagnosis not present

## 2020-10-31 DIAGNOSIS — D509 Iron deficiency anemia, unspecified: Secondary | ICD-10-CM | POA: Diagnosis not present

## 2020-10-31 DIAGNOSIS — N2581 Secondary hyperparathyroidism of renal origin: Secondary | ICD-10-CM | POA: Diagnosis not present

## 2020-10-31 DIAGNOSIS — Z992 Dependence on renal dialysis: Secondary | ICD-10-CM | POA: Diagnosis not present

## 2020-10-31 DIAGNOSIS — N186 End stage renal disease: Secondary | ICD-10-CM | POA: Diagnosis not present

## 2020-11-01 DIAGNOSIS — D509 Iron deficiency anemia, unspecified: Secondary | ICD-10-CM | POA: Diagnosis not present

## 2020-11-01 DIAGNOSIS — Z992 Dependence on renal dialysis: Secondary | ICD-10-CM | POA: Diagnosis not present

## 2020-11-01 DIAGNOSIS — N2581 Secondary hyperparathyroidism of renal origin: Secondary | ICD-10-CM | POA: Diagnosis not present

## 2020-11-01 DIAGNOSIS — D631 Anemia in chronic kidney disease: Secondary | ICD-10-CM | POA: Diagnosis not present

## 2020-11-01 DIAGNOSIS — N186 End stage renal disease: Secondary | ICD-10-CM | POA: Diagnosis not present

## 2020-11-02 DIAGNOSIS — D509 Iron deficiency anemia, unspecified: Secondary | ICD-10-CM | POA: Diagnosis not present

## 2020-11-02 DIAGNOSIS — D631 Anemia in chronic kidney disease: Secondary | ICD-10-CM | POA: Diagnosis not present

## 2020-11-02 DIAGNOSIS — Z992 Dependence on renal dialysis: Secondary | ICD-10-CM | POA: Diagnosis not present

## 2020-11-02 DIAGNOSIS — N2581 Secondary hyperparathyroidism of renal origin: Secondary | ICD-10-CM | POA: Diagnosis not present

## 2020-11-02 DIAGNOSIS — N186 End stage renal disease: Secondary | ICD-10-CM | POA: Diagnosis not present

## 2020-11-03 DIAGNOSIS — D631 Anemia in chronic kidney disease: Secondary | ICD-10-CM | POA: Diagnosis not present

## 2020-11-03 DIAGNOSIS — N2581 Secondary hyperparathyroidism of renal origin: Secondary | ICD-10-CM | POA: Diagnosis not present

## 2020-11-03 DIAGNOSIS — B078 Other viral warts: Secondary | ICD-10-CM | POA: Diagnosis not present

## 2020-11-03 DIAGNOSIS — N186 End stage renal disease: Secondary | ICD-10-CM | POA: Diagnosis not present

## 2020-11-03 DIAGNOSIS — Z992 Dependence on renal dialysis: Secondary | ICD-10-CM | POA: Diagnosis not present

## 2020-11-03 DIAGNOSIS — D509 Iron deficiency anemia, unspecified: Secondary | ICD-10-CM | POA: Diagnosis not present

## 2020-11-04 DIAGNOSIS — D509 Iron deficiency anemia, unspecified: Secondary | ICD-10-CM | POA: Diagnosis not present

## 2020-11-04 DIAGNOSIS — Z992 Dependence on renal dialysis: Secondary | ICD-10-CM | POA: Diagnosis not present

## 2020-11-04 DIAGNOSIS — D631 Anemia in chronic kidney disease: Secondary | ICD-10-CM | POA: Diagnosis not present

## 2020-11-04 DIAGNOSIS — N186 End stage renal disease: Secondary | ICD-10-CM | POA: Diagnosis not present

## 2020-11-04 DIAGNOSIS — N2581 Secondary hyperparathyroidism of renal origin: Secondary | ICD-10-CM | POA: Diagnosis not present

## 2020-11-05 DIAGNOSIS — N186 End stage renal disease: Secondary | ICD-10-CM | POA: Diagnosis not present

## 2020-11-05 DIAGNOSIS — N2581 Secondary hyperparathyroidism of renal origin: Secondary | ICD-10-CM | POA: Diagnosis not present

## 2020-11-05 DIAGNOSIS — Z992 Dependence on renal dialysis: Secondary | ICD-10-CM | POA: Diagnosis not present

## 2020-11-05 DIAGNOSIS — D631 Anemia in chronic kidney disease: Secondary | ICD-10-CM | POA: Diagnosis not present

## 2020-11-05 DIAGNOSIS — D509 Iron deficiency anemia, unspecified: Secondary | ICD-10-CM | POA: Diagnosis not present

## 2020-11-06 DIAGNOSIS — N2581 Secondary hyperparathyroidism of renal origin: Secondary | ICD-10-CM | POA: Diagnosis not present

## 2020-11-06 DIAGNOSIS — Z992 Dependence on renal dialysis: Secondary | ICD-10-CM | POA: Diagnosis not present

## 2020-11-06 DIAGNOSIS — D631 Anemia in chronic kidney disease: Secondary | ICD-10-CM | POA: Diagnosis not present

## 2020-11-06 DIAGNOSIS — D509 Iron deficiency anemia, unspecified: Secondary | ICD-10-CM | POA: Diagnosis not present

## 2020-11-06 DIAGNOSIS — N186 End stage renal disease: Secondary | ICD-10-CM | POA: Diagnosis not present

## 2020-11-07 DIAGNOSIS — N2581 Secondary hyperparathyroidism of renal origin: Secondary | ICD-10-CM | POA: Diagnosis not present

## 2020-11-07 DIAGNOSIS — Z992 Dependence on renal dialysis: Secondary | ICD-10-CM | POA: Diagnosis not present

## 2020-11-07 DIAGNOSIS — D631 Anemia in chronic kidney disease: Secondary | ICD-10-CM | POA: Diagnosis not present

## 2020-11-07 DIAGNOSIS — N186 End stage renal disease: Secondary | ICD-10-CM | POA: Diagnosis not present

## 2020-11-07 DIAGNOSIS — D509 Iron deficiency anemia, unspecified: Secondary | ICD-10-CM | POA: Diagnosis not present

## 2020-11-08 DIAGNOSIS — D631 Anemia in chronic kidney disease: Secondary | ICD-10-CM | POA: Diagnosis not present

## 2020-11-08 DIAGNOSIS — D509 Iron deficiency anemia, unspecified: Secondary | ICD-10-CM | POA: Diagnosis not present

## 2020-11-08 DIAGNOSIS — N2581 Secondary hyperparathyroidism of renal origin: Secondary | ICD-10-CM | POA: Diagnosis not present

## 2020-11-08 DIAGNOSIS — N186 End stage renal disease: Secondary | ICD-10-CM | POA: Diagnosis not present

## 2020-11-08 DIAGNOSIS — Z992 Dependence on renal dialysis: Secondary | ICD-10-CM | POA: Diagnosis not present

## 2020-11-09 DIAGNOSIS — N2581 Secondary hyperparathyroidism of renal origin: Secondary | ICD-10-CM | POA: Diagnosis not present

## 2020-11-09 DIAGNOSIS — Z992 Dependence on renal dialysis: Secondary | ICD-10-CM | POA: Diagnosis not present

## 2020-11-09 DIAGNOSIS — D631 Anemia in chronic kidney disease: Secondary | ICD-10-CM | POA: Diagnosis not present

## 2020-11-09 DIAGNOSIS — D509 Iron deficiency anemia, unspecified: Secondary | ICD-10-CM | POA: Diagnosis not present

## 2020-11-09 DIAGNOSIS — N186 End stage renal disease: Secondary | ICD-10-CM | POA: Diagnosis not present

## 2020-11-10 ENCOUNTER — Telehealth: Payer: Self-pay | Admitting: Emergency Medicine

## 2020-11-10 DIAGNOSIS — N2581 Secondary hyperparathyroidism of renal origin: Secondary | ICD-10-CM | POA: Diagnosis not present

## 2020-11-10 DIAGNOSIS — N186 End stage renal disease: Secondary | ICD-10-CM | POA: Diagnosis not present

## 2020-11-10 DIAGNOSIS — D509 Iron deficiency anemia, unspecified: Secondary | ICD-10-CM | POA: Diagnosis not present

## 2020-11-10 DIAGNOSIS — D631 Anemia in chronic kidney disease: Secondary | ICD-10-CM | POA: Diagnosis not present

## 2020-11-10 DIAGNOSIS — Z992 Dependence on renal dialysis: Secondary | ICD-10-CM | POA: Diagnosis not present

## 2020-11-10 NOTE — Telephone Encounter (Signed)
We do not have any documentation on pt being on O2. Last OV that pt had, we were trying to get pt back on CPAP and pt had HST performed. After the HST, new order was placed for the CPAP.   Called and spoke with Sharee Pimple from [place] letting her know that we have no documentation in any of pt's recent OVs going back to 09/2019 where it states that pt was on O2 and should continue O2. She said that she saw notes from where pt used to see Dr. Luan Pulling in [place] office where it stated for him to continue on O2.  Again stated to Sharee Pimple that in none of pt's OVs was he on O2 and there was also no documentation in any of the OVs where it stated for pt to continue on O2. Sharee Pimple stated that she has dx of COPD for pt from when pt used to see Dr. Luan Pulling and I stated to her that we have dx of OSA on CPAP and asthma.  Sharee Pimple said that she would call pt about the discussion that she had with me to see if he would like to have the O2 picked up. She said that the bill that pt has received he will still need to pay even if he has not been wearing the O2.  I stated to her that if pt does want to still be on the O2 that pt would need to come in for an appt and also have a qualification walk done to see if he still does in fact need the O2 due to [place] not having any documentation stating that he is to be on O2 and she verbalized understanding.

## 2020-11-10 NOTE — Telephone Encounter (Signed)
Calling to get ov notes, prescription and dx code for pt to get o2 as his insurance requires a pa every 6 months. Sharee Pimple can be reached at [phone]. Can be faxed to [phone].

## 2020-11-10 NOTE — Telephone Encounter (Signed)
Called and spoke to pt. Pt states he spoke with [employer] and was informed about missing information, pt was unsure what it was related to and wants [place] to inquire. [place] [phone] ([place]) and was transferred to [facility] VM. Left VM for Sharee Pimple to return our call regarding pt and missing information.

## 2020-11-11 DIAGNOSIS — D631 Anemia in chronic kidney disease: Secondary | ICD-10-CM | POA: Diagnosis not present

## 2020-11-11 DIAGNOSIS — N2581 Secondary hyperparathyroidism of renal origin: Secondary | ICD-10-CM | POA: Diagnosis not present

## 2020-11-11 DIAGNOSIS — D509 Iron deficiency anemia, unspecified: Secondary | ICD-10-CM | POA: Diagnosis not present

## 2020-11-11 DIAGNOSIS — N186 End stage renal disease: Secondary | ICD-10-CM | POA: Diagnosis not present

## 2020-11-11 DIAGNOSIS — Z992 Dependence on renal dialysis: Secondary | ICD-10-CM | POA: Diagnosis not present

## 2020-11-12 DIAGNOSIS — N186 End stage renal disease: Secondary | ICD-10-CM | POA: Diagnosis not present

## 2020-11-12 DIAGNOSIS — D509 Iron deficiency anemia, unspecified: Secondary | ICD-10-CM | POA: Diagnosis not present

## 2020-11-12 DIAGNOSIS — Z992 Dependence on renal dialysis: Secondary | ICD-10-CM | POA: Diagnosis not present

## 2020-11-12 DIAGNOSIS — D631 Anemia in chronic kidney disease: Secondary | ICD-10-CM | POA: Diagnosis not present

## 2020-11-12 DIAGNOSIS — N2581 Secondary hyperparathyroidism of renal origin: Secondary | ICD-10-CM | POA: Diagnosis not present

## 2020-11-13 DIAGNOSIS — Z992 Dependence on renal dialysis: Secondary | ICD-10-CM | POA: Diagnosis not present

## 2020-11-13 DIAGNOSIS — D509 Iron deficiency anemia, unspecified: Secondary | ICD-10-CM | POA: Diagnosis not present

## 2020-11-13 DIAGNOSIS — N186 End stage renal disease: Secondary | ICD-10-CM | POA: Diagnosis not present

## 2020-11-13 DIAGNOSIS — N2581 Secondary hyperparathyroidism of renal origin: Secondary | ICD-10-CM | POA: Diagnosis not present

## 2020-11-13 DIAGNOSIS — D631 Anemia in chronic kidney disease: Secondary | ICD-10-CM | POA: Diagnosis not present

## 2020-11-14 DIAGNOSIS — D509 Iron deficiency anemia, unspecified: Secondary | ICD-10-CM | POA: Diagnosis not present

## 2020-11-14 DIAGNOSIS — N186 End stage renal disease: Secondary | ICD-10-CM | POA: Diagnosis not present

## 2020-11-14 DIAGNOSIS — D631 Anemia in chronic kidney disease: Secondary | ICD-10-CM | POA: Diagnosis not present

## 2020-11-14 DIAGNOSIS — Z992 Dependence on renal dialysis: Secondary | ICD-10-CM | POA: Diagnosis not present

## 2020-11-15 DIAGNOSIS — Z992 Dependence on renal dialysis: Secondary | ICD-10-CM | POA: Diagnosis not present

## 2020-11-15 DIAGNOSIS — N186 End stage renal disease: Secondary | ICD-10-CM | POA: Diagnosis not present

## 2020-11-15 DIAGNOSIS — D509 Iron deficiency anemia, unspecified: Secondary | ICD-10-CM | POA: Diagnosis not present

## 2020-11-15 DIAGNOSIS — D631 Anemia in chronic kidney disease: Secondary | ICD-10-CM | POA: Diagnosis not present

## 2020-11-16 DIAGNOSIS — D509 Iron deficiency anemia, unspecified: Secondary | ICD-10-CM | POA: Diagnosis not present

## 2020-11-16 DIAGNOSIS — Z992 Dependence on renal dialysis: Secondary | ICD-10-CM | POA: Diagnosis not present

## 2020-11-16 DIAGNOSIS — N186 End stage renal disease: Secondary | ICD-10-CM | POA: Diagnosis not present

## 2020-11-16 DIAGNOSIS — D631 Anemia in chronic kidney disease: Secondary | ICD-10-CM | POA: Diagnosis not present

## 2020-11-17 DIAGNOSIS — D509 Iron deficiency anemia, unspecified: Secondary | ICD-10-CM | POA: Diagnosis not present

## 2020-11-17 DIAGNOSIS — Z992 Dependence on renal dialysis: Secondary | ICD-10-CM | POA: Diagnosis not present

## 2020-11-17 DIAGNOSIS — N186 End stage renal disease: Secondary | ICD-10-CM | POA: Diagnosis not present

## 2020-11-17 DIAGNOSIS — D631 Anemia in chronic kidney disease: Secondary | ICD-10-CM | POA: Diagnosis not present

## 2020-11-18 DIAGNOSIS — Z992 Dependence on renal dialysis: Secondary | ICD-10-CM | POA: Diagnosis not present

## 2020-11-18 DIAGNOSIS — D509 Iron deficiency anemia, unspecified: Secondary | ICD-10-CM | POA: Diagnosis not present

## 2020-11-18 DIAGNOSIS — N186 End stage renal disease: Secondary | ICD-10-CM | POA: Diagnosis not present

## 2020-11-18 DIAGNOSIS — D631 Anemia in chronic kidney disease: Secondary | ICD-10-CM | POA: Diagnosis not present

## 2020-11-19 DIAGNOSIS — D631 Anemia in chronic kidney disease: Secondary | ICD-10-CM | POA: Diagnosis not present

## 2020-11-19 DIAGNOSIS — Z992 Dependence on renal dialysis: Secondary | ICD-10-CM | POA: Diagnosis not present

## 2020-11-19 DIAGNOSIS — D509 Iron deficiency anemia, unspecified: Secondary | ICD-10-CM | POA: Diagnosis not present

## 2020-11-19 DIAGNOSIS — N186 End stage renal disease: Secondary | ICD-10-CM | POA: Diagnosis not present

## 2020-11-20 DIAGNOSIS — N186 End stage renal disease: Secondary | ICD-10-CM | POA: Diagnosis not present

## 2020-11-20 DIAGNOSIS — D509 Iron deficiency anemia, unspecified: Secondary | ICD-10-CM | POA: Diagnosis not present

## 2020-11-20 DIAGNOSIS — D631 Anemia in chronic kidney disease: Secondary | ICD-10-CM | POA: Diagnosis not present

## 2020-11-20 DIAGNOSIS — Z992 Dependence on renal dialysis: Secondary | ICD-10-CM | POA: Diagnosis not present

## 2020-11-21 DIAGNOSIS — Z992 Dependence on renal dialysis: Secondary | ICD-10-CM | POA: Diagnosis not present

## 2020-11-21 DIAGNOSIS — D509 Iron deficiency anemia, unspecified: Secondary | ICD-10-CM | POA: Diagnosis not present

## 2020-11-21 DIAGNOSIS — D631 Anemia in chronic kidney disease: Secondary | ICD-10-CM | POA: Diagnosis not present

## 2020-11-21 DIAGNOSIS — N186 End stage renal disease: Secondary | ICD-10-CM | POA: Diagnosis not present

## 2020-11-22 DIAGNOSIS — D509 Iron deficiency anemia, unspecified: Secondary | ICD-10-CM | POA: Diagnosis not present

## 2020-11-22 DIAGNOSIS — D631 Anemia in chronic kidney disease: Secondary | ICD-10-CM | POA: Diagnosis not present

## 2020-11-22 DIAGNOSIS — N186 End stage renal disease: Secondary | ICD-10-CM | POA: Diagnosis not present

## 2020-11-22 DIAGNOSIS — Z992 Dependence on renal dialysis: Secondary | ICD-10-CM | POA: Diagnosis not present

## 2020-11-23 DIAGNOSIS — Z992 Dependence on renal dialysis: Secondary | ICD-10-CM | POA: Diagnosis not present

## 2020-11-23 DIAGNOSIS — N186 End stage renal disease: Secondary | ICD-10-CM | POA: Diagnosis not present

## 2020-11-23 DIAGNOSIS — D509 Iron deficiency anemia, unspecified: Secondary | ICD-10-CM | POA: Diagnosis not present

## 2020-11-23 DIAGNOSIS — D631 Anemia in chronic kidney disease: Secondary | ICD-10-CM | POA: Diagnosis not present

## 2020-11-24 DIAGNOSIS — D631 Anemia in chronic kidney disease: Secondary | ICD-10-CM | POA: Diagnosis not present

## 2020-11-24 DIAGNOSIS — Z992 Dependence on renal dialysis: Secondary | ICD-10-CM | POA: Diagnosis not present

## 2020-11-24 DIAGNOSIS — D509 Iron deficiency anemia, unspecified: Secondary | ICD-10-CM | POA: Diagnosis not present

## 2020-11-24 DIAGNOSIS — N186 End stage renal disease: Secondary | ICD-10-CM | POA: Diagnosis not present

## 2020-11-24 NOTE — Telephone Encounter (Signed)
error 

## 2020-11-25 DIAGNOSIS — Z992 Dependence on renal dialysis: Secondary | ICD-10-CM | POA: Diagnosis not present

## 2020-11-25 DIAGNOSIS — D631 Anemia in chronic kidney disease: Secondary | ICD-10-CM | POA: Diagnosis not present

## 2020-11-25 DIAGNOSIS — D509 Iron deficiency anemia, unspecified: Secondary | ICD-10-CM | POA: Diagnosis not present

## 2020-11-25 DIAGNOSIS — N186 End stage renal disease: Secondary | ICD-10-CM | POA: Diagnosis not present

## 2020-11-26 DIAGNOSIS — N186 End stage renal disease: Secondary | ICD-10-CM | POA: Diagnosis not present

## 2020-11-26 DIAGNOSIS — Z992 Dependence on renal dialysis: Secondary | ICD-10-CM | POA: Diagnosis not present

## 2020-11-26 DIAGNOSIS — D509 Iron deficiency anemia, unspecified: Secondary | ICD-10-CM | POA: Diagnosis not present

## 2020-11-26 DIAGNOSIS — D631 Anemia in chronic kidney disease: Secondary | ICD-10-CM | POA: Diagnosis not present

## 2020-11-27 DIAGNOSIS — Z992 Dependence on renal dialysis: Secondary | ICD-10-CM | POA: Diagnosis not present

## 2020-11-27 DIAGNOSIS — D631 Anemia in chronic kidney disease: Secondary | ICD-10-CM | POA: Diagnosis not present

## 2020-11-27 DIAGNOSIS — D509 Iron deficiency anemia, unspecified: Secondary | ICD-10-CM | POA: Diagnosis not present

## 2020-11-27 DIAGNOSIS — N186 End stage renal disease: Secondary | ICD-10-CM | POA: Diagnosis not present

## 2020-11-28 DIAGNOSIS — D509 Iron deficiency anemia, unspecified: Secondary | ICD-10-CM | POA: Diagnosis not present

## 2020-11-28 DIAGNOSIS — N186 End stage renal disease: Secondary | ICD-10-CM | POA: Diagnosis not present

## 2020-11-28 DIAGNOSIS — Z992 Dependence on renal dialysis: Secondary | ICD-10-CM | POA: Diagnosis not present

## 2020-11-28 DIAGNOSIS — D631 Anemia in chronic kidney disease: Secondary | ICD-10-CM | POA: Diagnosis not present

## 2020-11-29 DIAGNOSIS — Z992 Dependence on renal dialysis: Secondary | ICD-10-CM | POA: Diagnosis not present

## 2020-11-29 DIAGNOSIS — D631 Anemia in chronic kidney disease: Secondary | ICD-10-CM | POA: Diagnosis not present

## 2020-11-29 DIAGNOSIS — D509 Iron deficiency anemia, unspecified: Secondary | ICD-10-CM | POA: Diagnosis not present

## 2020-11-29 DIAGNOSIS — N186 End stage renal disease: Secondary | ICD-10-CM | POA: Diagnosis not present

## 2020-11-30 DIAGNOSIS — D631 Anemia in chronic kidney disease: Secondary | ICD-10-CM | POA: Diagnosis not present

## 2020-11-30 DIAGNOSIS — Z992 Dependence on renal dialysis: Secondary | ICD-10-CM | POA: Diagnosis not present

## 2020-11-30 DIAGNOSIS — N186 End stage renal disease: Secondary | ICD-10-CM | POA: Diagnosis not present

## 2020-11-30 DIAGNOSIS — B351 Tinea unguium: Secondary | ICD-10-CM | POA: Diagnosis not present

## 2020-11-30 DIAGNOSIS — E1142 Type 2 diabetes mellitus with diabetic polyneuropathy: Secondary | ICD-10-CM | POA: Diagnosis not present

## 2020-11-30 DIAGNOSIS — D509 Iron deficiency anemia, unspecified: Secondary | ICD-10-CM | POA: Diagnosis not present

## 2020-12-01 DIAGNOSIS — Z992 Dependence on renal dialysis: Secondary | ICD-10-CM | POA: Diagnosis not present

## 2020-12-01 DIAGNOSIS — D509 Iron deficiency anemia, unspecified: Secondary | ICD-10-CM | POA: Diagnosis not present

## 2020-12-01 DIAGNOSIS — D631 Anemia in chronic kidney disease: Secondary | ICD-10-CM | POA: Diagnosis not present

## 2020-12-01 DIAGNOSIS — N186 End stage renal disease: Secondary | ICD-10-CM | POA: Diagnosis not present

## 2020-12-02 DIAGNOSIS — N186 End stage renal disease: Secondary | ICD-10-CM | POA: Diagnosis not present

## 2020-12-02 DIAGNOSIS — D509 Iron deficiency anemia, unspecified: Secondary | ICD-10-CM | POA: Diagnosis not present

## 2020-12-02 DIAGNOSIS — D631 Anemia in chronic kidney disease: Secondary | ICD-10-CM | POA: Diagnosis not present

## 2020-12-02 DIAGNOSIS — Z992 Dependence on renal dialysis: Secondary | ICD-10-CM | POA: Diagnosis not present

## 2020-12-03 DIAGNOSIS — Z992 Dependence on renal dialysis: Secondary | ICD-10-CM | POA: Diagnosis not present

## 2020-12-03 DIAGNOSIS — D631 Anemia in chronic kidney disease: Secondary | ICD-10-CM | POA: Diagnosis not present

## 2020-12-03 DIAGNOSIS — D509 Iron deficiency anemia, unspecified: Secondary | ICD-10-CM | POA: Diagnosis not present

## 2020-12-03 DIAGNOSIS — N186 End stage renal disease: Secondary | ICD-10-CM | POA: Diagnosis not present

## 2020-12-04 DIAGNOSIS — D509 Iron deficiency anemia, unspecified: Secondary | ICD-10-CM | POA: Diagnosis not present

## 2020-12-04 DIAGNOSIS — N186 End stage renal disease: Secondary | ICD-10-CM | POA: Diagnosis not present

## 2020-12-04 DIAGNOSIS — D631 Anemia in chronic kidney disease: Secondary | ICD-10-CM | POA: Diagnosis not present

## 2020-12-04 DIAGNOSIS — Z992 Dependence on renal dialysis: Secondary | ICD-10-CM | POA: Diagnosis not present

## 2020-12-05 DIAGNOSIS — N186 End stage renal disease: Secondary | ICD-10-CM | POA: Diagnosis not present

## 2020-12-05 DIAGNOSIS — Z992 Dependence on renal dialysis: Secondary | ICD-10-CM | POA: Diagnosis not present

## 2020-12-05 DIAGNOSIS — D509 Iron deficiency anemia, unspecified: Secondary | ICD-10-CM | POA: Diagnosis not present

## 2020-12-05 DIAGNOSIS — D631 Anemia in chronic kidney disease: Secondary | ICD-10-CM | POA: Diagnosis not present

## 2020-12-06 DIAGNOSIS — Z992 Dependence on renal dialysis: Secondary | ICD-10-CM | POA: Diagnosis not present

## 2020-12-06 DIAGNOSIS — N186 End stage renal disease: Secondary | ICD-10-CM | POA: Diagnosis not present

## 2020-12-06 DIAGNOSIS — D631 Anemia in chronic kidney disease: Secondary | ICD-10-CM | POA: Diagnosis not present

## 2020-12-06 DIAGNOSIS — D509 Iron deficiency anemia, unspecified: Secondary | ICD-10-CM | POA: Diagnosis not present

## 2020-12-07 DIAGNOSIS — Z992 Dependence on renal dialysis: Secondary | ICD-10-CM | POA: Diagnosis not present

## 2020-12-07 DIAGNOSIS — D631 Anemia in chronic kidney disease: Secondary | ICD-10-CM | POA: Diagnosis not present

## 2020-12-07 DIAGNOSIS — N186 End stage renal disease: Secondary | ICD-10-CM | POA: Diagnosis not present

## 2020-12-07 DIAGNOSIS — D509 Iron deficiency anemia, unspecified: Secondary | ICD-10-CM | POA: Diagnosis not present

## 2020-12-08 DIAGNOSIS — D631 Anemia in chronic kidney disease: Secondary | ICD-10-CM | POA: Diagnosis not present

## 2020-12-08 DIAGNOSIS — Z992 Dependence on renal dialysis: Secondary | ICD-10-CM | POA: Diagnosis not present

## 2020-12-08 DIAGNOSIS — D509 Iron deficiency anemia, unspecified: Secondary | ICD-10-CM | POA: Diagnosis not present

## 2020-12-08 DIAGNOSIS — N186 End stage renal disease: Secondary | ICD-10-CM | POA: Diagnosis not present

## 2020-12-09 DIAGNOSIS — Z992 Dependence on renal dialysis: Secondary | ICD-10-CM | POA: Diagnosis not present

## 2020-12-09 DIAGNOSIS — N186 End stage renal disease: Secondary | ICD-10-CM | POA: Diagnosis not present

## 2020-12-09 DIAGNOSIS — D509 Iron deficiency anemia, unspecified: Secondary | ICD-10-CM | POA: Diagnosis not present

## 2020-12-09 DIAGNOSIS — D631 Anemia in chronic kidney disease: Secondary | ICD-10-CM | POA: Diagnosis not present

## 2020-12-10 DIAGNOSIS — N186 End stage renal disease: Secondary | ICD-10-CM | POA: Diagnosis not present

## 2020-12-10 DIAGNOSIS — D631 Anemia in chronic kidney disease: Secondary | ICD-10-CM | POA: Diagnosis not present

## 2020-12-10 DIAGNOSIS — Z992 Dependence on renal dialysis: Secondary | ICD-10-CM | POA: Diagnosis not present

## 2020-12-10 DIAGNOSIS — D509 Iron deficiency anemia, unspecified: Secondary | ICD-10-CM | POA: Diagnosis not present

## 2020-12-11 DIAGNOSIS — D631 Anemia in chronic kidney disease: Secondary | ICD-10-CM | POA: Diagnosis not present

## 2020-12-11 DIAGNOSIS — N186 End stage renal disease: Secondary | ICD-10-CM | POA: Diagnosis not present

## 2020-12-11 DIAGNOSIS — D509 Iron deficiency anemia, unspecified: Secondary | ICD-10-CM | POA: Diagnosis not present

## 2020-12-11 DIAGNOSIS — Z992 Dependence on renal dialysis: Secondary | ICD-10-CM | POA: Diagnosis not present

## 2020-12-12 DIAGNOSIS — N186 End stage renal disease: Secondary | ICD-10-CM | POA: Diagnosis not present

## 2020-12-12 DIAGNOSIS — D631 Anemia in chronic kidney disease: Secondary | ICD-10-CM | POA: Diagnosis not present

## 2020-12-12 DIAGNOSIS — D509 Iron deficiency anemia, unspecified: Secondary | ICD-10-CM | POA: Diagnosis not present

## 2020-12-12 DIAGNOSIS — Z992 Dependence on renal dialysis: Secondary | ICD-10-CM | POA: Diagnosis not present

## 2020-12-13 DIAGNOSIS — D631 Anemia in chronic kidney disease: Secondary | ICD-10-CM | POA: Diagnosis not present

## 2020-12-13 DIAGNOSIS — Z992 Dependence on renal dialysis: Secondary | ICD-10-CM | POA: Diagnosis not present

## 2020-12-13 DIAGNOSIS — D509 Iron deficiency anemia, unspecified: Secondary | ICD-10-CM | POA: Diagnosis not present

## 2020-12-13 DIAGNOSIS — N186 End stage renal disease: Secondary | ICD-10-CM | POA: Diagnosis not present

## 2020-12-14 ENCOUNTER — Other Ambulatory Visit: Payer: Self-pay | Admitting: Nurse Practitioner

## 2020-12-14 DIAGNOSIS — D631 Anemia in chronic kidney disease: Secondary | ICD-10-CM | POA: Diagnosis not present

## 2020-12-14 DIAGNOSIS — Z992 Dependence on renal dialysis: Secondary | ICD-10-CM | POA: Diagnosis not present

## 2020-12-14 DIAGNOSIS — D509 Iron deficiency anemia, unspecified: Secondary | ICD-10-CM | POA: Diagnosis not present

## 2020-12-14 DIAGNOSIS — N185 Chronic kidney disease, stage 5: Secondary | ICD-10-CM

## 2020-12-14 DIAGNOSIS — E1122 Type 2 diabetes mellitus with diabetic chronic kidney disease: Secondary | ICD-10-CM

## 2020-12-14 DIAGNOSIS — N186 End stage renal disease: Secondary | ICD-10-CM | POA: Diagnosis not present

## 2020-12-15 ENCOUNTER — Other Ambulatory Visit: Payer: Self-pay

## 2020-12-15 ENCOUNTER — Ambulatory Visit: Payer: Medicare Other | Admitting: Nurse Practitioner

## 2020-12-15 ENCOUNTER — Ambulatory Visit (INDEPENDENT_AMBULATORY_CARE_PROVIDER_SITE_OTHER): Payer: Medicare Other | Admitting: Nurse Practitioner

## 2020-12-15 ENCOUNTER — Encounter: Payer: Self-pay | Admitting: Nurse Practitioner

## 2020-12-15 VITALS — BP 116/70 | HR 78 | Ht 64.0 in | Wt 228.0 lb

## 2020-12-15 DIAGNOSIS — E782 Mixed hyperlipidemia: Secondary | ICD-10-CM | POA: Diagnosis not present

## 2020-12-15 DIAGNOSIS — Z992 Dependence on renal dialysis: Secondary | ICD-10-CM | POA: Diagnosis not present

## 2020-12-15 DIAGNOSIS — I10 Essential (primary) hypertension: Secondary | ICD-10-CM

## 2020-12-15 DIAGNOSIS — N186 End stage renal disease: Secondary | ICD-10-CM | POA: Diagnosis not present

## 2020-12-15 DIAGNOSIS — E1122 Type 2 diabetes mellitus with diabetic chronic kidney disease: Secondary | ICD-10-CM | POA: Diagnosis not present

## 2020-12-15 DIAGNOSIS — N185 Chronic kidney disease, stage 5: Secondary | ICD-10-CM | POA: Diagnosis not present

## 2020-12-15 DIAGNOSIS — D509 Iron deficiency anemia, unspecified: Secondary | ICD-10-CM | POA: Diagnosis not present

## 2020-12-15 DIAGNOSIS — D631 Anemia in chronic kidney disease: Secondary | ICD-10-CM | POA: Diagnosis not present

## 2020-12-15 LAB — POCT GLYCOSYLATED HEMOGLOBIN (HGB A1C): Hemoglobin A1C: 6.2 % — AB (ref 4.0–5.6)

## 2020-12-15 NOTE — Progress Notes (Signed)
12/15/2020   Endocrinology follow-up note   Subjective:    Patient ID: [name] [name], male    DOB: 19-Aug-1950, PCP [name], MD   Past Medical History:  Diagnosis Date  . Anemia   . Arthritis   . Asthma   . Bell palsy   . CAD (coronary artery disease)    a. 2014 MV: abnl w/ infap ischemia; b. 03/2013 Cath: aneurysmal bleb in the LAD w/ otw nonobs dzs-->Med Rx.  . Chronic back pain   . Chronic knee pain    a. 09/2015 s/p R TKA.  Marland Kitchen Chronic pain   . Chronic shoulder pain   . Chronic sinusitis   . CKD (chronic kidney disease), stage IV ([place])    stage 4 per office visit note of Dr Lowanda Foster on 05/2015   . COPD (chronic obstructive pulmonary disease) ([place])   . Diabetes mellitus without complication ([place])    type II   . Essential hypertension   . GERD (gastroesophageal reflux disease)   . Gout   . Gout   . Hepatomegaly    noted on noncontrast CT 2015  . History of hiatal hernia   . Hyperlipidemia   . Lateral meniscus tear   . Obesity    [place]  . Obstructive sleep apnea    does not use cpap   . On home oxygen therapy    uses 2l when is going somewhere per patient   . PUD (peptic ulcer disease)    remote, reports f/u EGD about 8 years ago unremarkable   . Reactive airway disease    related to exposure to chemical during 9/11  . Renal insufficiency   . Sinusitis   . Vitamin D deficiency    Past Surgical History:  Procedure Laterality Date  . ASAD LT SHOULDER  12/2008   left shoulder  . AV FISTULA PLACEMENT Left 08/09/2016   Procedure: BRACHIOCEPHALIC ARTERIOVENOUS (AV) FISTULA CREATION LEFT ARM;  Surgeon: [name], MD;  Location: [facility];  Service: Vascular;  Laterality: Left;  . CAPD INSERTION N/A 10/07/2018   Procedure: LAPAROSCOPIC PERITONEAL CATHETER PLACEMENT;  Surgeon: Kieth Brightly Arta Bruce, MD;  Location: [facility];  Service: General;  Laterality: N/A;  . CATARACT EXTRACTION W/PHACO Left 03/28/2016   Procedure: CATARACT EXTRACTION PHACO  AND INTRAOCULAR LENS PLACEMENT LEFT EYE;  Surgeon: [name], MD;  Location: [facility];  Service: Ophthalmology;  Laterality: Left;  CDE: 4.77  . CATARACT EXTRACTION W/PHACO Right 04/11/2016   Procedure: CATARACT EXTRACTION PHACO AND INTRAOCULAR LENS PLACEMENT RIGHT EYE; CDE:  4.74;  Surgeon: [name], MD;  Location: [facility];  Service: Ophthalmology;  Laterality: Right;  . COLONOSCOPY  10/2008   Fields: Rectal polyp obliterated, not retrieved, hemorrhoids, single ascending colon diverticulum near the CV. Next colonoscopy April 2020  . COLONOSCOPY N/A 12/25/2014   SLF: 1. Colorectal polyps (2) removed 2. Small internal hemorrhoids 3. the left colon is severely redundant  . DOPPLER ECHOCARDIOGRAPHY    . ESOPHAGOGASTRODUODENOSCOPY N/A 12/25/2014   SLF: 1. Anemia most likely due to CRI, gastritis, gastric polyps 2. Moderate non-erosive gastriits and mild duodenitis.  3.TWo large gstric polyps removed.   Marland Kitchen EYE SURGERY  12/22/2010   tear duct probing-[place]  . FOREIGN BODY REMOVAL  03/29/2011   Procedure: REMOVAL FOREIGN BODY EXTREMITY;  Surgeon: Arther Abbott, MD;  Location: [facility];  Service: Orthopedics;  Laterality: Right;  Removal Foreign Body Right Thumb  .  IR FLUORO GUIDE CV LINE RIGHT  08/06/2018  . IR US GUIDE VASC ACCESS RIGHT  08/06/2018  . KNEE ARTHROSCOPY  10/2007   left  . KNEE ARTHROSCOPY WITH LATERAL MENISECTOMY Right 10/14/2015   Procedure: LEFT KNEE ARTHROSCOPY WITH PARTIAL LATERAL MENISECTOMY;  Surgeon: Carole Civil, MD;  Location: [facility];  Service: Orthopedics;  Laterality: Right;  . LEFT HEART CATHETERIZATION WITH CORONARY ANGIOGRAM N/A 03/28/2013   Procedure: LEFT HEART CATHETERIZATION WITH CORONARY ANGIOGRAM;  Surgeon: Leonie Man, MD;  Location: [facility] C[facility];  Service: Cardiovascular;  Laterality: N/A;  . NM MYOVIEW LTD    . PENILE PROSTHESIS IMPLANT N/A 08/16/2015   Procedure: PENILE PROTHESIS INFLATABLE, three piece, Excisional biopsy of Penile ulcer, Penile molding;   Surgeon: Carolan Clines, MD;  Location: [facility];  Service: Urology;  Laterality: N/A;  . PENILE PROSTHESIS IMPLANT N/A 12/24/2017   Procedure: REMOVAL AND  REPLACEMENT  COLOPLAST PENILE PROSTHESIS;  Surgeon: Lucas Mallow, MD;  Location: [facility];  Service: Urology;  Laterality: N/A;  . QUADRICEPS TENDON REPAIR  07/21/2011   Procedure: REPAIR QUADRICEP TENDON;  Surgeon: Arther Abbott, MD;  Location: [facility];  Service: Orthopedics;  Laterality: Right;  . TOENAIL EXCISION     removed [id number]  . UMBILICAL HERNIA REPAIR  2007   [place]   Social History   Socioeconomic History  . Marital status: Married    Spouse name: Not on file  . Number of children: 2  . Years of education: 12th grade  . Highest education level: Not on file  Occupational History  . Occupation: disabled  . Occupation:      Employer: UNEMPLOYED  Tobacco Use  . Smoking status: Former Smoker    Packs/day: 1.00    Years: 25.00    Pack years: 25.00    Types: Cigarettes    Quit date: 03/27/2010    Years since quitting: 10.7  . Smokeless tobacco: Never Used  . Tobacco comment: Quit x 7 years  Vaping Use  . Vaping Use: Never used  Substance and Sexual Activity  . Alcohol use: Yes    Alcohol/week: 0.0 standard drinks    Comment: occasionally  . Drug use: Not Currently    Types: Marijuana    Comment: cocaine- last time used- 11/24/2017 , marijuana-   . Sexual activity: Not on file  Other Topics Concern  . Not on file  Social History Narrative   He quit smoking in 2010. 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 He developed pulmonary problems, became disabled because of lower airway disease in 2009.       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   Social Determinants of Health   Financial Resource Strain: Not on file  Food Insecurity: Not on file  Transportation Needs: Not on file  Physical Activity: Not on file  Stress: Not on file  Social Connections: Not on  file   Outpatient Encounter Medications as of 12/15/2020  Medication Sig  . amLODipine (NORVASC) 5 MG tablet Take 1 tablet (5 mg total) by mouth daily.  Marland Kitchen aspirin EC 81 MG tablet Take 81 mg by mouth daily. Swallow whole.  . budesonide-formoterol (SYMBICORT) 160-4.5 MCG/ACT inhaler Inhale 2 puffs into the lungs 2 (two) times daily.  . calcitRIOL (ROCALTROL) 0.25 MCG capsule Take 0.25 mcg by mouth daily.  . fluticasone (FLONASE) 50 MCG/ACT nasal spray Place 2 sprays into both nostrils daily.  . Insulin Pen Needle (DROPLET PEN  NEEDLES) 31G X 5 MM MISC Use to inject insulin daily as directed  . LANTUS SOLOSTAR 100 UNIT/ML Solostar Pen INJECT  30 UNITS SUBCUTANEOUSLY AT BEDTIME  . losartan (COZAAR) 100 MG tablet   . mometasone (NASONEX) 50 MCG/ACT nasal spray Place 2 sprays into the nose 2 (two) times daily as needed (allergies).  . pantoprazole (PROTONIX) 40 MG tablet TAKE 1 TABLET TWICE DAILY 30 MINUTES PRIOR TO MEALS  . simvastatin (ZOCOR) 20 MG tablet Take 20 mg by mouth every morning.  . TRADJENTA 5 MG TABS tablet TAKE 1 TABLET EVERY DAY  . [DISCONTINUED] NOVOLOG FLEXPEN 100 UNIT/ML FlexPen    No facility-administered encounter medications on file as of 12/15/2020.   ALLERGIES: Allergies  Allergen Reactions  . Tramadol Hcl   . Opana [Oxymorphone Hcl] Itching  . Oxymorphone Itching  . Tramadol Itching   VACCINATION STATUS: Immunization History  Administered Date(s) Administered  . Influenza, High Dose Seasonal PF 09/12/2018, 04/17/2019, 05/11/2020  . Influenza-Unspecified 09/12/2018, 04/01/2019  . Moderna Sars-Covid-2 Vaccination 09/18/2019, 10/16/2019, 05/24/2020  . PPD Test 07/19/2018, 09/07/2018, 11/18/2019  . Pneumococcal Polysaccharide-23 09/13/2018  . Td 03/27/2011    Diabetes He presents for his follow-up diabetic visit. He has type 2 diabetes mellitus. Onset time: He was diagnosed approximately 20 years ago. His disease course has been stable. There are no hypoglycemic  associated symptoms. Pertinent negatives for hypoglycemia include no confusion, headaches, pallor or seizures. Pertinent negatives for diabetes include no chest pain, no fatigue, no polydipsia, no polyphagia, no polyuria and no weakness. There are no hypoglycemic complications. Symptoms are stable. Diabetic complications include heart disease and nephropathy. (Patient with end-stage renal disease now on hemodialysis.) Risk factors for coronary artery disease include dyslipidemia, diabetes mellitus, hypertension, obesity, sedentary lifestyle, tobacco exposure and male sex. Current diabetic treatment includes insulin injections and oral agent (monotherapy). He is compliant with treatment most of the time. His weight is decreasing steadily. He is following a generally healthy diet. He has had a previous visit with a dietitian. He rarely participates in exercise. His home blood glucose trend is fluctuating minimally. His breakfast blood glucose range is generally 110-130 mg/dl. His overall blood glucose range is 110-130 mg/dl. (He presents today with his meter and logs showing stable, at goal, glycemic profile overall.  His POCT A1c today is 6.2%, essentially unchanged from previous visit of 6.1%.  There are no episodes of hypoglycemia documented or reported.) An ACE inhibitor/angiotensin II receptor blocker is being taken. He sees a podiatrist.Eye exam is current.  Hypertension This is a chronic problem. The current episode started more than 1 year ago. The problem has been gradually improving since onset. The problem is controlled. Pertinent negatives include no chest pain, headaches, neck pain, palpitations or shortness of breath. There are no associated agents to hypertension. Risk factors for coronary artery disease include diabetes mellitus, dyslipidemia, obesity, smoking/tobacco exposure, sedentary lifestyle and male gender. Past treatments include angiotensin blockers and calcium channel blockers. There are no  compliance problems.  Hypertensive end-organ damage includes kidney disease and CAD/MI. Identifiable causes of hypertension include chronic renal disease and sleep apnea.  Hyperlipidemia This is a chronic problem. The current episode started more than 1 year ago. The problem is controlled. Recent lipid tests were reviewed and are variable (LDL normal at 63 triglycerides elevated at 157.). Exacerbating diseases include chronic renal disease, diabetes and obesity. Factors aggravating his hyperlipidemia include fatty foods. Pertinent negatives include no chest pain, myalgias or shortness of breath. Current antihyperlipidemic treatment includes  statins. The current treatment provides moderate improvement of lipids. Compliance problems include adherence to diet and adherence to exercise.  Risk factors for coronary artery disease include diabetes mellitus, dyslipidemia, hypertension, male sex, a sedentary lifestyle and obesity.    Review of systems  Constitutional: + Minimally fluctuating body weight,  current Body mass index is 39.14 kg/m. , no fatigue, no subjective hyperthermia, no subjective hypothermia Eyes: no blurry vision, no xerophthalmia ENT: no sore throat, no nodules palpated in throat, no dysphagia/odynophagia, no hoarseness Cardiovascular: no chest pain, no shortness of breath, no palpitations, no leg swelling Respiratory: no cough, no shortness of breath Gastrointestinal: no nausea/vomiting/diarrhea Musculoskeletal: no muscle/joint aches Skin: no rashes, no hyperemia Neurological: no tremors, no numbness, no tingling, no dizziness Psychiatric: no depression, no anxiety   Objective:    BP 116/70   Pulse 78   Ht 5\' 4"  (1.626 m)   Wt 228 lb (103.4 kg)   BMI 39.14 kg/m   Wt Readings from Last 3 Encounters:  12/15/20 228 lb (103.4 kg)  10/25/20 236 lb (107 kg)  07/22/20 244 lb (110.7 kg)    BP Readings from Last 3 Encounters:  12/15/20 116/70  10/25/20 122/75  07/22/20 139/82     Physical Exam- Limited  Constitutional:  Body mass index is 39.14 kg/m. , not in acute distress, normal state of mind Eyes:  EOMI, no exophthalmos Neck: Supple Cardiovascular: RRR, no murmurs, rubs, or gallops, no edema Respiratory: Adequate breathing efforts, no crackles, rales, rhonchi, or wheezing Musculoskeletal: no gross deformities, strength intact in all four extremities, no gross restriction of joint movements Skin:  no rashes, no hyperemia Neurological: no tremor with outstretched hands   Results for orders placed or performed in visit on 12/15/20  HgB A1c  Result Value Ref Range   Hemoglobin A1C 6.2 (A) 4.0 - 5.6 %   HbA1c POC (<> result, manual entry)     HbA1c, POC (prediabetic range)     HbA1c, POC (controlled diabetic range)     Diabetic Labs (most recent): Lab Results  Component Value Date   HGBA1C 6.2 (A) 12/15/2020   HGBA1C 6.1 06/16/2020   HGBA1C 6.0 (A) 02/16/2020   Lipid Panel     Component Value Date/Time   CHOL 117 02/02/2020 1410   TRIG 157 (H) 02/02/2020 1410   HDL 27 (L) 02/02/2020 1410   CHOLHDL 4.3 02/02/2020 1410   CHOLHDL 2.8 09/06/2018 1018   VLDL 38 (H) 06/16/2016 0956   LDLCALC 63 02/02/2020 1410   LDLCALC 52 09/06/2018 1018     Assessment & Plan:   1) Type 2 diabetes mellitus with stage 4 chronic kidney disease, with long-term current use of insulin (HCC)  -His  Diabetes is complicated by end-stage renal disease recently initiated on hemodialysis,  and patient remains at a high risk for more acute and chronic complications of diabetes which include CAD, CVA, CKD, retinopathy, and neuropathy. These are all discussed in detail with the patient.  He presents today with his meter and logs showing stable, at goal, glycemic profile overall.  His POCT A1c today is 6.2%, essentially unchanged from previous visit of 6.1%.  There are no episodes of hypoglycemia documented or reported.   Recent labs reviewed.  - Nutritional counseling  repeated at each appointment due to patients tendency to fall back in to old habits.  - The patient admits there is a room for improvement in their diet and drink choices. -  Suggestion is made for the patient to  avoid simple carbohydrates from their diet including Cakes, Sweet Desserts / Pastries, Ice Cream, Soda (diet and regular), Sweet Tea, Candies, Chips, Cookies, Sweet Pastries, Store Bought Juices, Alcohol in Excess of 1-2 drinks a day, Artificial Sweeteners, Coffee Creamer, and "Sugar-free" Products. This will help patient to have stable blood glucose profile and potentially avoid unintended weight gain.   - I encouraged the patient to switch to unprocessed or minimally processed complex starch and increased protein intake (animal or plant source), fruits, and vegetables.   - Patient is advised to stick to a routine mealtimes to eat 3 meals a day and avoid unnecessary snacks (to snack only to correct hypoglycemia).  - I have approached patient with the following individualized plan to manage diabetes and patient agrees.  -He is advised to stay away from alcohol and smoking.  -Based on his stable, at goal glycemic profile, he is advised to continue his Lantus to 30 units SQ nightly and continue Tradjenta 5 mg p.o. daily with breakfast.  -He is encouraged to continue monitoring blood glucose twice daily, before breakfast and before bed, and to call the clinic if he has readings less than 70 or greater than 200 for 3 tests in a row.  He is following with Jearld Fenton, CDE for DM education.   -He will continue to follow-up with nephrology given stage 5 CKD on PD.  -he is not a candidate for metformin and SGLT2 inhibitors due to CKD.   - Patient specific target  for A1c; LDL, HDL, Triglycerides, were discussed in detail.  2) BP/HTN:  His blood pressure is controlled to target. He is advised to continue Norvasc 5 mg po daily and Losartan 100 mg po daily.    3) Lipids/HPL:  His recent  lipid panel from 02/02/20 shows LDL stable at 63 and triglycerides worsening at 157.  He is advised to continue Simvastatin 20 mg p.o. nightly.  Will recheck lipid panel prior to next visit.  4)  Weight/Diet: His Body mass index is 39.14 kg/m.- clearly complicating his DM care. He is a candidate for modest weight loss.   CDE consult in progress, exercise, and carbohydrates information provided.   5) Chronic Care/Health Maintenance: -Patient is on ARB and Statin medications and encouraged to continue to follow up with Ophthalmology, nephrology (he is status post fistula placement in prep for hemodialysis) Podiatrist at least yearly or according to recommendations, and advised to quit smoking. I have recommended yearly flu vaccine and pneumonia vaccination at least every 5 years; moderate intensity exercise for up to 150 minutes weekly; and  sleep for at least 7 hours a day.  - I advised patient to maintain close follow up with his PCP for primary care needs.        I spent 40 minutes in the care of the patient today including review of labs from [place], Lipids, Thyroid Function, Hematology (current and previous including abstractions from other facilities); face-to-face time discussing  his blood glucose readings/logs, discussing hypoglycemia and hyperglycemia episodes and symptoms, medications doses, his options of short and long term treatment based on the latest standards of care / guidelines;  discussion about incorporating lifestyle medicine;  and documenting the encounter.    Please refer to Patient Instructions for Blood Glucose Monitoring and Insulin/Medications Dosing Guide"  in media tab for additional information. Please  also refer to " Patient Self Inventory" in the Media  tab for reviewed elements of pertinent patient history.  [name] [name] participated in the discussions,  expressed understanding, and voiced agreement with the above plans.  All questions were answered to his  satisfaction. he is encouraged to contact clinic should he have any questions or concerns prior to his return visit.    Follow up plan: -Return today (on 12/15/2020) for Diabetes F/U with A1c in office, Previsit labs, Bring meter and logs.     Rayetta Pigg, [facility] [address] [place], [place] [address] Phone: [phone] Fax: [phone]   12/15/2020, 4:06 PM

## 2020-12-15 NOTE — Patient Instructions (Signed)
Diabetes Mellitus and Nutrition, Adult When you have diabetes, or diabetes mellitus, it is very important to have healthy eating habits because your blood sugar (glucose) levels are greatly affected by what you eat and drink. Eating healthy foods in the right amounts, at about the same times every day, can help you:  Control your blood glucose.  Lower your risk of heart disease.  Improve your blood pressure.  Reach or maintain a healthy weight. What can affect my meal plan? Every person with diabetes is different, and each person has different needs for a meal plan. Your health care provider may recommend that you work with a dietitian to make a meal plan that is best for you. Your meal plan may vary depending on factors such as:  The calories you need.  The medicines you take.  Your weight.  Your blood glucose, blood pressure, and cholesterol levels.  Your activity level.  Other health conditions you have, such as heart or kidney disease. How do carbohydrates affect me? Carbohydrates, also called carbs, affect your blood glucose level more than any other type of food. Eating carbs naturally raises the amount of glucose in your blood. Carb counting is a method for keeping track of how many carbs you eat. Counting carbs is important to keep your blood glucose at a healthy level, especially if you use insulin or take certain oral diabetes medicines. It is important to know how many carbs you can safely have in each meal. This is different for every person. Your dietitian can help you calculate how many carbs you should have at each meal and for each snack. How does alcohol affect me? Alcohol can cause a sudden decrease in blood glucose (hypoglycemia), especially if you use insulin or take certain oral diabetes medicines. Hypoglycemia can be a life-threatening condition. Symptoms of hypoglycemia, such as sleepiness, dizziness, and confusion, are similar to symptoms of having too much  alcohol.  Do not drink alcohol if: ? Your health care provider tells you not to drink. ? You are pregnant, may be pregnant, or are planning to become pregnant.  If you drink alcohol: ? Do not drink on an empty stomach. ? Limit how much you use to:  0-1 drink a day for women.  0-2 drinks a day for men. ? Be aware of how much alcohol is in your drink. In the [place] one drink equals one 12 oz bottle of beer (355 mL), one 5 oz glass of wine (148 mL), or one 1 oz glass of hard liquor (44 mL). ? Keep yourself hydrated with water, diet soda, or unsweetened iced tea.  Keep in mind that regular soda, juice, and other mixers may contain a lot of sugar and must be counted as carbs. What are tips for following this plan? Reading food labels  Start by checking the serving size on the "Nutrition Facts" label of packaged foods and drinks. The amount of calories, carbs, fats, and other nutrients listed on the label is based on one serving of the item. Many items contain more than one serving per package.  Check the total grams (g) of carbs in one serving. You can calculate the number of servings of carbs in one serving by dividing the total carbs by 15. For example, if a food has 30 g of total carbs per serving, it would be equal to 2 servings of carbs.  Check the number of grams (g) of saturated fats and trans fats in one serving. Choose foods that have   a low amount or none of these fats.  Check the number of milligrams (mg) of salt (sodium) in one serving. Most people should limit total sodium intake to less than 2,300 mg per day.  Always check the nutrition information of foods labeled as "low-fat" or "nonfat." These foods may be higher in added sugar or refined carbs and should be avoided.  Talk to your dietitian to identify your daily goals for nutrients listed on the label. Shopping  Avoid buying canned, pre-made, or processed foods. These foods tend to be high in fat, sodium, and added  sugar.  Shop around the outside edge of the grocery store. This is where you will most often find fresh fruits and vegetables, bulk grains, fresh meats, and fresh dairy. Cooking  Use low-heat cooking methods, such as baking, instead of high-heat cooking methods like deep frying.  Cook using healthy oils, such as olive, canola, or sunflower oil.  Avoid cooking with butter, cream, or high-fat meats. Meal planning  Eat meals and snacks regularly, preferably at the same times every day. Avoid going long periods of time without eating.  Eat foods that are high in fiber, such as fresh fruits, vegetables, beans, and whole grains. Talk with your dietitian about how many servings of carbs you can eat at each meal.  Eat 4-6 oz (112-168 g) of lean protein each day, such as lean meat, chicken, fish, eggs, or tofu. One ounce (oz) of lean protein is equal to: ? 1 oz (28 g) of meat, chicken, or fish. ? 1 egg. ?  cup (62 g) of tofu.  Eat some foods each day that contain healthy fats, such as avocado, nuts, seeds, and fish.   What foods should I eat? Fruits Berries. Apples. Oranges. Peaches. Apricots. Plums. Grapes. Mango. Papaya. Pomegranate. Kiwi. Cherries. Vegetables Lettuce. Spinach. Leafy greens, including kale, chard, collard greens, and mustard greens. Beets. Cauliflower. Cabbage. Broccoli. Carrots. Green beans. Tomatoes. Peppers. Onions. Cucumbers. Brussels sprouts. Grains Whole grains, such as whole-wheat or whole-grain bread, crackers, tortillas, cereal, and pasta. Unsweetened oatmeal. Quinoa. Brown or wild rice. Meats and other proteins Seafood. Poultry without skin. Lean cuts of poultry and beef. Tofu. Nuts. Seeds. Dairy Low-fat or fat-free dairy products such as milk, yogurt, and cheese. The items listed above may not be a complete list of foods and beverages you can eat. Contact a dietitian for more information. What foods should I avoid? Fruits Fruits canned with  syrup. Vegetables Canned vegetables. Frozen vegetables with butter or cream sauce. Grains Refined white flour and flour products such as bread, pasta, snack foods, and cereals. Avoid all processed foods. Meats and other proteins Fatty cuts of meat. Poultry with skin. Breaded or fried meats. Processed meat. Avoid saturated fats. Dairy Full-fat yogurt, cheese, or milk. Beverages Sweetened drinks, such as soda or iced tea. The items listed above may not be a complete list of foods and beverages you should avoid. Contact a dietitian for more information. Questions to ask a health care provider  Do I need to meet with a diabetes educator?  Do I need to meet with a dietitian?  What number can I call if I have questions?  When are the best times to check my blood glucose? Where to find more information:  American Diabetes Association: diabetes.org  Academy of Nutrition and Dietetics: www.eatright.org  National Institute of Diabetes and Digestive and Kidney Diseases: www.niddk.nih.gov  Association of Diabetes Care and Education Specialists: www.diabeteseducator.org Summary  It is important to have healthy eating   habits because your blood sugar (glucose) levels are greatly affected by what you eat and drink.  A healthy meal plan will help you control your blood glucose and maintain a healthy lifestyle.  Your health care provider may recommend that you work with a dietitian to make a meal plan that is best for you.  Keep in mind that carbohydrates (carbs) and alcohol have immediate effects on your blood glucose levels. It is important to count carbs and to use alcohol carefully. This information is not intended to replace advice given to you by your health care provider. Make sure you discuss any questions you have with your health care provider. Document Revised: 06/10/2019 Document Reviewed: 06/10/2019 Elsevier Patient Education  2021 Elsevier Inc.  

## 2020-12-16 DIAGNOSIS — D509 Iron deficiency anemia, unspecified: Secondary | ICD-10-CM | POA: Diagnosis not present

## 2020-12-16 DIAGNOSIS — D631 Anemia in chronic kidney disease: Secondary | ICD-10-CM | POA: Diagnosis not present

## 2020-12-16 DIAGNOSIS — N186 End stage renal disease: Secondary | ICD-10-CM | POA: Diagnosis not present

## 2020-12-16 DIAGNOSIS — Z992 Dependence on renal dialysis: Secondary | ICD-10-CM | POA: Diagnosis not present

## 2020-12-17 DIAGNOSIS — Z23 Encounter for immunization: Secondary | ICD-10-CM | POA: Diagnosis not present

## 2020-12-17 DIAGNOSIS — D509 Iron deficiency anemia, unspecified: Secondary | ICD-10-CM | POA: Diagnosis not present

## 2020-12-17 DIAGNOSIS — Z992 Dependence on renal dialysis: Secondary | ICD-10-CM | POA: Diagnosis not present

## 2020-12-17 DIAGNOSIS — D631 Anemia in chronic kidney disease: Secondary | ICD-10-CM | POA: Diagnosis not present

## 2020-12-17 DIAGNOSIS — N186 End stage renal disease: Secondary | ICD-10-CM | POA: Diagnosis not present

## 2020-12-18 DIAGNOSIS — D631 Anemia in chronic kidney disease: Secondary | ICD-10-CM | POA: Diagnosis not present

## 2020-12-18 DIAGNOSIS — Z992 Dependence on renal dialysis: Secondary | ICD-10-CM | POA: Diagnosis not present

## 2020-12-18 DIAGNOSIS — D509 Iron deficiency anemia, unspecified: Secondary | ICD-10-CM | POA: Diagnosis not present

## 2020-12-18 DIAGNOSIS — N186 End stage renal disease: Secondary | ICD-10-CM | POA: Diagnosis not present

## 2020-12-19 DIAGNOSIS — Z992 Dependence on renal dialysis: Secondary | ICD-10-CM | POA: Diagnosis not present

## 2020-12-19 DIAGNOSIS — N186 End stage renal disease: Secondary | ICD-10-CM | POA: Diagnosis not present

## 2020-12-19 DIAGNOSIS — D509 Iron deficiency anemia, unspecified: Secondary | ICD-10-CM | POA: Diagnosis not present

## 2020-12-19 DIAGNOSIS — D631 Anemia in chronic kidney disease: Secondary | ICD-10-CM | POA: Diagnosis not present

## 2020-12-20 DIAGNOSIS — D631 Anemia in chronic kidney disease: Secondary | ICD-10-CM | POA: Diagnosis not present

## 2020-12-20 DIAGNOSIS — N186 End stage renal disease: Secondary | ICD-10-CM | POA: Diagnosis not present

## 2020-12-20 DIAGNOSIS — D509 Iron deficiency anemia, unspecified: Secondary | ICD-10-CM | POA: Diagnosis not present

## 2020-12-20 DIAGNOSIS — Z992 Dependence on renal dialysis: Secondary | ICD-10-CM | POA: Diagnosis not present

## 2020-12-21 DIAGNOSIS — N186 End stage renal disease: Secondary | ICD-10-CM | POA: Diagnosis not present

## 2020-12-21 DIAGNOSIS — Z992 Dependence on renal dialysis: Secondary | ICD-10-CM | POA: Diagnosis not present

## 2020-12-21 DIAGNOSIS — D509 Iron deficiency anemia, unspecified: Secondary | ICD-10-CM | POA: Diagnosis not present

## 2020-12-21 DIAGNOSIS — D631 Anemia in chronic kidney disease: Secondary | ICD-10-CM | POA: Diagnosis not present

## 2020-12-22 DIAGNOSIS — Z992 Dependence on renal dialysis: Secondary | ICD-10-CM | POA: Diagnosis not present

## 2020-12-22 DIAGNOSIS — D631 Anemia in chronic kidney disease: Secondary | ICD-10-CM | POA: Diagnosis not present

## 2020-12-22 DIAGNOSIS — N186 End stage renal disease: Secondary | ICD-10-CM | POA: Diagnosis not present

## 2020-12-22 DIAGNOSIS — D509 Iron deficiency anemia, unspecified: Secondary | ICD-10-CM | POA: Diagnosis not present

## 2020-12-23 DIAGNOSIS — Z992 Dependence on renal dialysis: Secondary | ICD-10-CM | POA: Diagnosis not present

## 2020-12-23 DIAGNOSIS — D631 Anemia in chronic kidney disease: Secondary | ICD-10-CM | POA: Diagnosis not present

## 2020-12-23 DIAGNOSIS — D509 Iron deficiency anemia, unspecified: Secondary | ICD-10-CM | POA: Diagnosis not present

## 2020-12-23 DIAGNOSIS — N186 End stage renal disease: Secondary | ICD-10-CM | POA: Diagnosis not present

## 2020-12-24 DIAGNOSIS — N186 End stage renal disease: Secondary | ICD-10-CM | POA: Diagnosis not present

## 2020-12-24 DIAGNOSIS — D509 Iron deficiency anemia, unspecified: Secondary | ICD-10-CM | POA: Diagnosis not present

## 2020-12-24 DIAGNOSIS — N411 Chronic prostatitis: Secondary | ICD-10-CM | POA: Diagnosis not present

## 2020-12-24 DIAGNOSIS — N5201 Erectile dysfunction due to arterial insufficiency: Secondary | ICD-10-CM | POA: Diagnosis not present

## 2020-12-24 DIAGNOSIS — Z992 Dependence on renal dialysis: Secondary | ICD-10-CM | POA: Diagnosis not present

## 2020-12-24 DIAGNOSIS — D631 Anemia in chronic kidney disease: Secondary | ICD-10-CM | POA: Diagnosis not present

## 2020-12-24 DIAGNOSIS — R102 Pelvic and perineal pain: Secondary | ICD-10-CM | POA: Diagnosis not present

## 2020-12-25 DIAGNOSIS — D631 Anemia in chronic kidney disease: Secondary | ICD-10-CM | POA: Diagnosis not present

## 2020-12-25 DIAGNOSIS — N186 End stage renal disease: Secondary | ICD-10-CM | POA: Diagnosis not present

## 2020-12-25 DIAGNOSIS — D509 Iron deficiency anemia, unspecified: Secondary | ICD-10-CM | POA: Diagnosis not present

## 2020-12-25 DIAGNOSIS — Z992 Dependence on renal dialysis: Secondary | ICD-10-CM | POA: Diagnosis not present

## 2020-12-26 DIAGNOSIS — N186 End stage renal disease: Secondary | ICD-10-CM | POA: Diagnosis not present

## 2020-12-26 DIAGNOSIS — Z992 Dependence on renal dialysis: Secondary | ICD-10-CM | POA: Diagnosis not present

## 2020-12-26 DIAGNOSIS — D509 Iron deficiency anemia, unspecified: Secondary | ICD-10-CM | POA: Diagnosis not present

## 2020-12-26 DIAGNOSIS — D631 Anemia in chronic kidney disease: Secondary | ICD-10-CM | POA: Diagnosis not present

## 2020-12-27 DIAGNOSIS — D631 Anemia in chronic kidney disease: Secondary | ICD-10-CM | POA: Diagnosis not present

## 2020-12-27 DIAGNOSIS — N186 End stage renal disease: Secondary | ICD-10-CM | POA: Diagnosis not present

## 2020-12-27 DIAGNOSIS — D509 Iron deficiency anemia, unspecified: Secondary | ICD-10-CM | POA: Diagnosis not present

## 2020-12-27 DIAGNOSIS — Z992 Dependence on renal dialysis: Secondary | ICD-10-CM | POA: Diagnosis not present

## 2020-12-28 DIAGNOSIS — N186 End stage renal disease: Secondary | ICD-10-CM | POA: Diagnosis not present

## 2020-12-28 DIAGNOSIS — D509 Iron deficiency anemia, unspecified: Secondary | ICD-10-CM | POA: Diagnosis not present

## 2020-12-28 DIAGNOSIS — Z992 Dependence on renal dialysis: Secondary | ICD-10-CM | POA: Diagnosis not present

## 2020-12-28 DIAGNOSIS — D631 Anemia in chronic kidney disease: Secondary | ICD-10-CM | POA: Diagnosis not present

## 2020-12-29 DIAGNOSIS — D509 Iron deficiency anemia, unspecified: Secondary | ICD-10-CM | POA: Diagnosis not present

## 2020-12-29 DIAGNOSIS — N186 End stage renal disease: Secondary | ICD-10-CM | POA: Diagnosis not present

## 2020-12-29 DIAGNOSIS — Z992 Dependence on renal dialysis: Secondary | ICD-10-CM | POA: Diagnosis not present

## 2020-12-29 DIAGNOSIS — D631 Anemia in chronic kidney disease: Secondary | ICD-10-CM | POA: Diagnosis not present

## 2020-12-30 ENCOUNTER — Telehealth: Payer: Self-pay

## 2020-12-30 DIAGNOSIS — Z992 Dependence on renal dialysis: Secondary | ICD-10-CM | POA: Diagnosis not present

## 2020-12-30 DIAGNOSIS — D509 Iron deficiency anemia, unspecified: Secondary | ICD-10-CM | POA: Diagnosis not present

## 2020-12-30 DIAGNOSIS — N186 End stage renal disease: Secondary | ICD-10-CM | POA: Diagnosis not present

## 2020-12-30 DIAGNOSIS — D631 Anemia in chronic kidney disease: Secondary | ICD-10-CM | POA: Diagnosis not present

## 2020-12-30 DIAGNOSIS — E1122 Type 2 diabetes mellitus with diabetic chronic kidney disease: Secondary | ICD-10-CM

## 2020-12-30 MED ORDER — DROPLET PEN NEEDLES 31G X 5 MM MISC: 2 refills | Status: DC

## 2020-12-30 NOTE — Telephone Encounter (Signed)
Sent rx refill for a larger quantity.

## 2020-12-30 NOTE — Telephone Encounter (Signed)
Pt wife came in and states that pt is running out of his pen needles early, states the quantity needs to be increased. [facility] mail order sent out his rx on 6/11 so they are hoping they come today. Pt is completely out. Informed pt if they do not come to please call us so that a supply can be sent to local pharmacy. In the meantime they are requesting a new rx with more quantity be sent to [facility] mail order    Insulin Pen Needle (DROPLET PEN NEEDLES) 31G X 5 MM MISC

## 2020-12-31 DIAGNOSIS — Z992 Dependence on renal dialysis: Secondary | ICD-10-CM | POA: Diagnosis not present

## 2020-12-31 DIAGNOSIS — N186 End stage renal disease: Secondary | ICD-10-CM | POA: Diagnosis not present

## 2020-12-31 DIAGNOSIS — D509 Iron deficiency anemia, unspecified: Secondary | ICD-10-CM | POA: Diagnosis not present

## 2020-12-31 DIAGNOSIS — D631 Anemia in chronic kidney disease: Secondary | ICD-10-CM | POA: Diagnosis not present

## 2021-01-01 DIAGNOSIS — D509 Iron deficiency anemia, unspecified: Secondary | ICD-10-CM | POA: Diagnosis not present

## 2021-01-01 DIAGNOSIS — N186 End stage renal disease: Secondary | ICD-10-CM | POA: Diagnosis not present

## 2021-01-01 DIAGNOSIS — D631 Anemia in chronic kidney disease: Secondary | ICD-10-CM | POA: Diagnosis not present

## 2021-01-01 DIAGNOSIS — Z992 Dependence on renal dialysis: Secondary | ICD-10-CM | POA: Diagnosis not present

## 2021-01-02 DIAGNOSIS — D509 Iron deficiency anemia, unspecified: Secondary | ICD-10-CM | POA: Diagnosis not present

## 2021-01-02 DIAGNOSIS — Z992 Dependence on renal dialysis: Secondary | ICD-10-CM | POA: Diagnosis not present

## 2021-01-02 DIAGNOSIS — N186 End stage renal disease: Secondary | ICD-10-CM | POA: Diagnosis not present

## 2021-01-02 DIAGNOSIS — D631 Anemia in chronic kidney disease: Secondary | ICD-10-CM | POA: Diagnosis not present

## 2021-01-03 DIAGNOSIS — N186 End stage renal disease: Secondary | ICD-10-CM | POA: Diagnosis not present

## 2021-01-03 DIAGNOSIS — D509 Iron deficiency anemia, unspecified: Secondary | ICD-10-CM | POA: Diagnosis not present

## 2021-01-03 DIAGNOSIS — D631 Anemia in chronic kidney disease: Secondary | ICD-10-CM | POA: Diagnosis not present

## 2021-01-03 DIAGNOSIS — Z992 Dependence on renal dialysis: Secondary | ICD-10-CM | POA: Diagnosis not present

## 2021-01-04 DIAGNOSIS — N186 End stage renal disease: Secondary | ICD-10-CM | POA: Diagnosis not present

## 2021-01-04 DIAGNOSIS — D631 Anemia in chronic kidney disease: Secondary | ICD-10-CM | POA: Diagnosis not present

## 2021-01-04 DIAGNOSIS — D509 Iron deficiency anemia, unspecified: Secondary | ICD-10-CM | POA: Diagnosis not present

## 2021-01-04 DIAGNOSIS — Z992 Dependence on renal dialysis: Secondary | ICD-10-CM | POA: Diagnosis not present

## 2021-01-05 ENCOUNTER — Encounter: Payer: Self-pay | Admitting: Internal Medicine

## 2021-01-05 DIAGNOSIS — D509 Iron deficiency anemia, unspecified: Secondary | ICD-10-CM | POA: Diagnosis not present

## 2021-01-05 DIAGNOSIS — N186 End stage renal disease: Secondary | ICD-10-CM | POA: Diagnosis not present

## 2021-01-05 DIAGNOSIS — Z992 Dependence on renal dialysis: Secondary | ICD-10-CM | POA: Diagnosis not present

## 2021-01-05 DIAGNOSIS — D631 Anemia in chronic kidney disease: Secondary | ICD-10-CM | POA: Diagnosis not present

## 2021-01-06 DIAGNOSIS — D631 Anemia in chronic kidney disease: Secondary | ICD-10-CM | POA: Diagnosis not present

## 2021-01-06 DIAGNOSIS — N186 End stage renal disease: Secondary | ICD-10-CM | POA: Diagnosis not present

## 2021-01-06 DIAGNOSIS — D509 Iron deficiency anemia, unspecified: Secondary | ICD-10-CM | POA: Diagnosis not present

## 2021-01-06 DIAGNOSIS — Z992 Dependence on renal dialysis: Secondary | ICD-10-CM | POA: Diagnosis not present

## 2021-01-07 DIAGNOSIS — Z992 Dependence on renal dialysis: Secondary | ICD-10-CM | POA: Diagnosis not present

## 2021-01-07 DIAGNOSIS — D631 Anemia in chronic kidney disease: Secondary | ICD-10-CM | POA: Diagnosis not present

## 2021-01-07 DIAGNOSIS — D509 Iron deficiency anemia, unspecified: Secondary | ICD-10-CM | POA: Diagnosis not present

## 2021-01-07 DIAGNOSIS — N186 End stage renal disease: Secondary | ICD-10-CM | POA: Diagnosis not present

## 2021-01-08 DIAGNOSIS — D509 Iron deficiency anemia, unspecified: Secondary | ICD-10-CM | POA: Diagnosis not present

## 2021-01-08 DIAGNOSIS — D631 Anemia in chronic kidney disease: Secondary | ICD-10-CM | POA: Diagnosis not present

## 2021-01-08 DIAGNOSIS — N186 End stage renal disease: Secondary | ICD-10-CM | POA: Diagnosis not present

## 2021-01-08 DIAGNOSIS — Z992 Dependence on renal dialysis: Secondary | ICD-10-CM | POA: Diagnosis not present

## 2021-01-09 DIAGNOSIS — D509 Iron deficiency anemia, unspecified: Secondary | ICD-10-CM | POA: Diagnosis not present

## 2021-01-09 DIAGNOSIS — D631 Anemia in chronic kidney disease: Secondary | ICD-10-CM | POA: Diagnosis not present

## 2021-01-09 DIAGNOSIS — Z992 Dependence on renal dialysis: Secondary | ICD-10-CM | POA: Diagnosis not present

## 2021-01-09 DIAGNOSIS — N186 End stage renal disease: Secondary | ICD-10-CM | POA: Diagnosis not present

## 2021-01-10 DIAGNOSIS — I10 Essential (primary) hypertension: Secondary | ICD-10-CM | POA: Diagnosis not present

## 2021-01-10 DIAGNOSIS — D509 Iron deficiency anemia, unspecified: Secondary | ICD-10-CM | POA: Diagnosis not present

## 2021-01-10 DIAGNOSIS — N186 End stage renal disease: Secondary | ICD-10-CM | POA: Diagnosis not present

## 2021-01-10 DIAGNOSIS — E1165 Type 2 diabetes mellitus with hyperglycemia: Secondary | ICD-10-CM | POA: Diagnosis not present

## 2021-01-10 DIAGNOSIS — D631 Anemia in chronic kidney disease: Secondary | ICD-10-CM | POA: Diagnosis not present

## 2021-01-10 DIAGNOSIS — Z992 Dependence on renal dialysis: Secondary | ICD-10-CM | POA: Diagnosis not present

## 2021-01-11 DIAGNOSIS — Z992 Dependence on renal dialysis: Secondary | ICD-10-CM | POA: Diagnosis not present

## 2021-01-11 DIAGNOSIS — D509 Iron deficiency anemia, unspecified: Secondary | ICD-10-CM | POA: Diagnosis not present

## 2021-01-11 DIAGNOSIS — N186 End stage renal disease: Secondary | ICD-10-CM | POA: Diagnosis not present

## 2021-01-11 DIAGNOSIS — D631 Anemia in chronic kidney disease: Secondary | ICD-10-CM | POA: Diagnosis not present

## 2021-01-12 DIAGNOSIS — D509 Iron deficiency anemia, unspecified: Secondary | ICD-10-CM | POA: Diagnosis not present

## 2021-01-12 DIAGNOSIS — D631 Anemia in chronic kidney disease: Secondary | ICD-10-CM | POA: Diagnosis not present

## 2021-01-12 DIAGNOSIS — Z992 Dependence on renal dialysis: Secondary | ICD-10-CM | POA: Diagnosis not present

## 2021-01-12 DIAGNOSIS — N186 End stage renal disease: Secondary | ICD-10-CM | POA: Diagnosis not present

## 2021-01-13 DIAGNOSIS — D631 Anemia in chronic kidney disease: Secondary | ICD-10-CM | POA: Diagnosis not present

## 2021-01-13 DIAGNOSIS — Z992 Dependence on renal dialysis: Secondary | ICD-10-CM | POA: Diagnosis not present

## 2021-01-13 DIAGNOSIS — N186 End stage renal disease: Secondary | ICD-10-CM | POA: Diagnosis not present

## 2021-01-13 DIAGNOSIS — D509 Iron deficiency anemia, unspecified: Secondary | ICD-10-CM | POA: Diagnosis not present

## 2021-01-14 DIAGNOSIS — N2581 Secondary hyperparathyroidism of renal origin: Secondary | ICD-10-CM | POA: Diagnosis not present

## 2021-01-14 DIAGNOSIS — D509 Iron deficiency anemia, unspecified: Secondary | ICD-10-CM | POA: Diagnosis not present

## 2021-01-14 DIAGNOSIS — Z992 Dependence on renal dialysis: Secondary | ICD-10-CM | POA: Diagnosis not present

## 2021-01-14 DIAGNOSIS — D631 Anemia in chronic kidney disease: Secondary | ICD-10-CM | POA: Diagnosis not present

## 2021-01-14 DIAGNOSIS — N186 End stage renal disease: Secondary | ICD-10-CM | POA: Diagnosis not present

## 2021-01-15 DIAGNOSIS — N186 End stage renal disease: Secondary | ICD-10-CM | POA: Diagnosis not present

## 2021-01-15 DIAGNOSIS — D509 Iron deficiency anemia, unspecified: Secondary | ICD-10-CM | POA: Diagnosis not present

## 2021-01-15 DIAGNOSIS — N2581 Secondary hyperparathyroidism of renal origin: Secondary | ICD-10-CM | POA: Diagnosis not present

## 2021-01-15 DIAGNOSIS — D631 Anemia in chronic kidney disease: Secondary | ICD-10-CM | POA: Diagnosis not present

## 2021-01-15 DIAGNOSIS — Z992 Dependence on renal dialysis: Secondary | ICD-10-CM | POA: Diagnosis not present

## 2021-01-16 DIAGNOSIS — N2581 Secondary hyperparathyroidism of renal origin: Secondary | ICD-10-CM | POA: Diagnosis not present

## 2021-01-16 DIAGNOSIS — Z992 Dependence on renal dialysis: Secondary | ICD-10-CM | POA: Diagnosis not present

## 2021-01-16 DIAGNOSIS — N186 End stage renal disease: Secondary | ICD-10-CM | POA: Diagnosis not present

## 2021-01-16 DIAGNOSIS — D631 Anemia in chronic kidney disease: Secondary | ICD-10-CM | POA: Diagnosis not present

## 2021-01-16 DIAGNOSIS — D509 Iron deficiency anemia, unspecified: Secondary | ICD-10-CM | POA: Diagnosis not present

## 2021-01-17 DIAGNOSIS — Z992 Dependence on renal dialysis: Secondary | ICD-10-CM | POA: Diagnosis not present

## 2021-01-17 DIAGNOSIS — N2581 Secondary hyperparathyroidism of renal origin: Secondary | ICD-10-CM | POA: Diagnosis not present

## 2021-01-17 DIAGNOSIS — D509 Iron deficiency anemia, unspecified: Secondary | ICD-10-CM | POA: Diagnosis not present

## 2021-01-17 DIAGNOSIS — D631 Anemia in chronic kidney disease: Secondary | ICD-10-CM | POA: Diagnosis not present

## 2021-01-17 DIAGNOSIS — N186 End stage renal disease: Secondary | ICD-10-CM | POA: Diagnosis not present

## 2021-01-18 DIAGNOSIS — D509 Iron deficiency anemia, unspecified: Secondary | ICD-10-CM | POA: Diagnosis not present

## 2021-01-18 DIAGNOSIS — N186 End stage renal disease: Secondary | ICD-10-CM | POA: Diagnosis not present

## 2021-01-18 DIAGNOSIS — Z992 Dependence on renal dialysis: Secondary | ICD-10-CM | POA: Diagnosis not present

## 2021-01-18 DIAGNOSIS — E119 Type 2 diabetes mellitus without complications: Secondary | ICD-10-CM | POA: Diagnosis not present

## 2021-01-18 DIAGNOSIS — N2581 Secondary hyperparathyroidism of renal origin: Secondary | ICD-10-CM | POA: Diagnosis not present

## 2021-01-18 DIAGNOSIS — D631 Anemia in chronic kidney disease: Secondary | ICD-10-CM | POA: Diagnosis not present

## 2021-01-18 DIAGNOSIS — Z794 Long term (current) use of insulin: Secondary | ICD-10-CM | POA: Diagnosis not present

## 2021-01-19 DIAGNOSIS — Z992 Dependence on renal dialysis: Secondary | ICD-10-CM | POA: Diagnosis not present

## 2021-01-19 DIAGNOSIS — D509 Iron deficiency anemia, unspecified: Secondary | ICD-10-CM | POA: Diagnosis not present

## 2021-01-19 DIAGNOSIS — D631 Anemia in chronic kidney disease: Secondary | ICD-10-CM | POA: Diagnosis not present

## 2021-01-19 DIAGNOSIS — N2581 Secondary hyperparathyroidism of renal origin: Secondary | ICD-10-CM | POA: Diagnosis not present

## 2021-01-19 DIAGNOSIS — N186 End stage renal disease: Secondary | ICD-10-CM | POA: Diagnosis not present

## 2021-01-20 DIAGNOSIS — Z992 Dependence on renal dialysis: Secondary | ICD-10-CM | POA: Diagnosis not present

## 2021-01-20 DIAGNOSIS — N2581 Secondary hyperparathyroidism of renal origin: Secondary | ICD-10-CM | POA: Diagnosis not present

## 2021-01-20 DIAGNOSIS — N186 End stage renal disease: Secondary | ICD-10-CM | POA: Diagnosis not present

## 2021-01-20 DIAGNOSIS — D509 Iron deficiency anemia, unspecified: Secondary | ICD-10-CM | POA: Diagnosis not present

## 2021-01-20 DIAGNOSIS — D631 Anemia in chronic kidney disease: Secondary | ICD-10-CM | POA: Diagnosis not present

## 2021-01-21 DIAGNOSIS — D631 Anemia in chronic kidney disease: Secondary | ICD-10-CM | POA: Diagnosis not present

## 2021-01-21 DIAGNOSIS — Z992 Dependence on renal dialysis: Secondary | ICD-10-CM | POA: Diagnosis not present

## 2021-01-21 DIAGNOSIS — N186 End stage renal disease: Secondary | ICD-10-CM | POA: Diagnosis not present

## 2021-01-21 DIAGNOSIS — N2581 Secondary hyperparathyroidism of renal origin: Secondary | ICD-10-CM | POA: Diagnosis not present

## 2021-01-21 DIAGNOSIS — D509 Iron deficiency anemia, unspecified: Secondary | ICD-10-CM | POA: Diagnosis not present

## 2021-01-22 DIAGNOSIS — D509 Iron deficiency anemia, unspecified: Secondary | ICD-10-CM | POA: Diagnosis not present

## 2021-01-22 DIAGNOSIS — N186 End stage renal disease: Secondary | ICD-10-CM | POA: Diagnosis not present

## 2021-01-22 DIAGNOSIS — N2581 Secondary hyperparathyroidism of renal origin: Secondary | ICD-10-CM | POA: Diagnosis not present

## 2021-01-22 DIAGNOSIS — Z992 Dependence on renal dialysis: Secondary | ICD-10-CM | POA: Diagnosis not present

## 2021-01-22 DIAGNOSIS — D631 Anemia in chronic kidney disease: Secondary | ICD-10-CM | POA: Diagnosis not present

## 2021-01-23 DIAGNOSIS — D509 Iron deficiency anemia, unspecified: Secondary | ICD-10-CM | POA: Diagnosis not present

## 2021-01-23 DIAGNOSIS — N186 End stage renal disease: Secondary | ICD-10-CM | POA: Diagnosis not present

## 2021-01-23 DIAGNOSIS — N2581 Secondary hyperparathyroidism of renal origin: Secondary | ICD-10-CM | POA: Diagnosis not present

## 2021-01-23 DIAGNOSIS — D631 Anemia in chronic kidney disease: Secondary | ICD-10-CM | POA: Diagnosis not present

## 2021-01-23 DIAGNOSIS — Z992 Dependence on renal dialysis: Secondary | ICD-10-CM | POA: Diagnosis not present

## 2021-01-24 DIAGNOSIS — N2581 Secondary hyperparathyroidism of renal origin: Secondary | ICD-10-CM | POA: Diagnosis not present

## 2021-01-24 DIAGNOSIS — D509 Iron deficiency anemia, unspecified: Secondary | ICD-10-CM | POA: Diagnosis not present

## 2021-01-24 DIAGNOSIS — N186 End stage renal disease: Secondary | ICD-10-CM | POA: Diagnosis not present

## 2021-01-24 DIAGNOSIS — D631 Anemia in chronic kidney disease: Secondary | ICD-10-CM | POA: Diagnosis not present

## 2021-01-24 DIAGNOSIS — Z992 Dependence on renal dialysis: Secondary | ICD-10-CM | POA: Diagnosis not present

## 2021-01-25 DIAGNOSIS — Z992 Dependence on renal dialysis: Secondary | ICD-10-CM | POA: Diagnosis not present

## 2021-01-25 DIAGNOSIS — D631 Anemia in chronic kidney disease: Secondary | ICD-10-CM | POA: Diagnosis not present

## 2021-01-25 DIAGNOSIS — N2581 Secondary hyperparathyroidism of renal origin: Secondary | ICD-10-CM | POA: Diagnosis not present

## 2021-01-25 DIAGNOSIS — N186 End stage renal disease: Secondary | ICD-10-CM | POA: Diagnosis not present

## 2021-01-25 DIAGNOSIS — D509 Iron deficiency anemia, unspecified: Secondary | ICD-10-CM | POA: Diagnosis not present

## 2021-01-26 DIAGNOSIS — D631 Anemia in chronic kidney disease: Secondary | ICD-10-CM | POA: Diagnosis not present

## 2021-01-26 DIAGNOSIS — N2581 Secondary hyperparathyroidism of renal origin: Secondary | ICD-10-CM | POA: Diagnosis not present

## 2021-01-26 DIAGNOSIS — Z992 Dependence on renal dialysis: Secondary | ICD-10-CM | POA: Diagnosis not present

## 2021-01-26 DIAGNOSIS — N186 End stage renal disease: Secondary | ICD-10-CM | POA: Diagnosis not present

## 2021-01-26 DIAGNOSIS — D509 Iron deficiency anemia, unspecified: Secondary | ICD-10-CM | POA: Diagnosis not present

## 2021-01-27 DIAGNOSIS — D631 Anemia in chronic kidney disease: Secondary | ICD-10-CM | POA: Diagnosis not present

## 2021-01-27 DIAGNOSIS — N2581 Secondary hyperparathyroidism of renal origin: Secondary | ICD-10-CM | POA: Diagnosis not present

## 2021-01-27 DIAGNOSIS — D509 Iron deficiency anemia, unspecified: Secondary | ICD-10-CM | POA: Diagnosis not present

## 2021-01-27 DIAGNOSIS — Z992 Dependence on renal dialysis: Secondary | ICD-10-CM | POA: Diagnosis not present

## 2021-01-27 DIAGNOSIS — N186 End stage renal disease: Secondary | ICD-10-CM | POA: Diagnosis not present

## 2021-01-28 DIAGNOSIS — N186 End stage renal disease: Secondary | ICD-10-CM | POA: Diagnosis not present

## 2021-01-28 DIAGNOSIS — D631 Anemia in chronic kidney disease: Secondary | ICD-10-CM | POA: Diagnosis not present

## 2021-01-28 DIAGNOSIS — D509 Iron deficiency anemia, unspecified: Secondary | ICD-10-CM | POA: Diagnosis not present

## 2021-01-28 DIAGNOSIS — Z992 Dependence on renal dialysis: Secondary | ICD-10-CM | POA: Diagnosis not present

## 2021-01-28 DIAGNOSIS — N2581 Secondary hyperparathyroidism of renal origin: Secondary | ICD-10-CM | POA: Diagnosis not present

## 2021-01-29 DIAGNOSIS — N2581 Secondary hyperparathyroidism of renal origin: Secondary | ICD-10-CM | POA: Diagnosis not present

## 2021-01-29 DIAGNOSIS — Z992 Dependence on renal dialysis: Secondary | ICD-10-CM | POA: Diagnosis not present

## 2021-01-29 DIAGNOSIS — D509 Iron deficiency anemia, unspecified: Secondary | ICD-10-CM | POA: Diagnosis not present

## 2021-01-29 DIAGNOSIS — D631 Anemia in chronic kidney disease: Secondary | ICD-10-CM | POA: Diagnosis not present

## 2021-01-29 DIAGNOSIS — N186 End stage renal disease: Secondary | ICD-10-CM | POA: Diagnosis not present

## 2021-01-30 DIAGNOSIS — D509 Iron deficiency anemia, unspecified: Secondary | ICD-10-CM | POA: Diagnosis not present

## 2021-01-30 DIAGNOSIS — Z992 Dependence on renal dialysis: Secondary | ICD-10-CM | POA: Diagnosis not present

## 2021-01-30 DIAGNOSIS — N2581 Secondary hyperparathyroidism of renal origin: Secondary | ICD-10-CM | POA: Diagnosis not present

## 2021-01-30 DIAGNOSIS — D631 Anemia in chronic kidney disease: Secondary | ICD-10-CM | POA: Diagnosis not present

## 2021-01-30 DIAGNOSIS — N186 End stage renal disease: Secondary | ICD-10-CM | POA: Diagnosis not present

## 2021-01-31 DIAGNOSIS — Z992 Dependence on renal dialysis: Secondary | ICD-10-CM | POA: Diagnosis not present

## 2021-01-31 DIAGNOSIS — D631 Anemia in chronic kidney disease: Secondary | ICD-10-CM | POA: Diagnosis not present

## 2021-01-31 DIAGNOSIS — D509 Iron deficiency anemia, unspecified: Secondary | ICD-10-CM | POA: Diagnosis not present

## 2021-01-31 DIAGNOSIS — N2581 Secondary hyperparathyroidism of renal origin: Secondary | ICD-10-CM | POA: Diagnosis not present

## 2021-01-31 DIAGNOSIS — N186 End stage renal disease: Secondary | ICD-10-CM | POA: Diagnosis not present

## 2021-02-01 DIAGNOSIS — D509 Iron deficiency anemia, unspecified: Secondary | ICD-10-CM | POA: Diagnosis not present

## 2021-02-01 DIAGNOSIS — N2581 Secondary hyperparathyroidism of renal origin: Secondary | ICD-10-CM | POA: Diagnosis not present

## 2021-02-01 DIAGNOSIS — N186 End stage renal disease: Secondary | ICD-10-CM | POA: Diagnosis not present

## 2021-02-01 DIAGNOSIS — D631 Anemia in chronic kidney disease: Secondary | ICD-10-CM | POA: Diagnosis not present

## 2021-02-01 DIAGNOSIS — Z992 Dependence on renal dialysis: Secondary | ICD-10-CM | POA: Diagnosis not present

## 2021-02-02 DIAGNOSIS — D631 Anemia in chronic kidney disease: Secondary | ICD-10-CM | POA: Diagnosis not present

## 2021-02-02 DIAGNOSIS — N186 End stage renal disease: Secondary | ICD-10-CM | POA: Diagnosis not present

## 2021-02-02 DIAGNOSIS — D509 Iron deficiency anemia, unspecified: Secondary | ICD-10-CM | POA: Diagnosis not present

## 2021-02-02 DIAGNOSIS — N2581 Secondary hyperparathyroidism of renal origin: Secondary | ICD-10-CM | POA: Diagnosis not present

## 2021-02-02 DIAGNOSIS — Z992 Dependence on renal dialysis: Secondary | ICD-10-CM | POA: Diagnosis not present

## 2021-02-03 DIAGNOSIS — Z992 Dependence on renal dialysis: Secondary | ICD-10-CM | POA: Diagnosis not present

## 2021-02-03 DIAGNOSIS — D509 Iron deficiency anemia, unspecified: Secondary | ICD-10-CM | POA: Diagnosis not present

## 2021-02-03 DIAGNOSIS — N186 End stage renal disease: Secondary | ICD-10-CM | POA: Diagnosis not present

## 2021-02-03 DIAGNOSIS — N2581 Secondary hyperparathyroidism of renal origin: Secondary | ICD-10-CM | POA: Diagnosis not present

## 2021-02-03 DIAGNOSIS — D631 Anemia in chronic kidney disease: Secondary | ICD-10-CM | POA: Diagnosis not present

## 2021-02-04 DIAGNOSIS — D631 Anemia in chronic kidney disease: Secondary | ICD-10-CM | POA: Diagnosis not present

## 2021-02-04 DIAGNOSIS — Z992 Dependence on renal dialysis: Secondary | ICD-10-CM | POA: Diagnosis not present

## 2021-02-04 DIAGNOSIS — D509 Iron deficiency anemia, unspecified: Secondary | ICD-10-CM | POA: Diagnosis not present

## 2021-02-04 DIAGNOSIS — N2581 Secondary hyperparathyroidism of renal origin: Secondary | ICD-10-CM | POA: Diagnosis not present

## 2021-02-04 DIAGNOSIS — N186 End stage renal disease: Secondary | ICD-10-CM | POA: Diagnosis not present

## 2021-02-05 DIAGNOSIS — D509 Iron deficiency anemia, unspecified: Secondary | ICD-10-CM | POA: Diagnosis not present

## 2021-02-05 DIAGNOSIS — N2581 Secondary hyperparathyroidism of renal origin: Secondary | ICD-10-CM | POA: Diagnosis not present

## 2021-02-05 DIAGNOSIS — N186 End stage renal disease: Secondary | ICD-10-CM | POA: Diagnosis not present

## 2021-02-05 DIAGNOSIS — D631 Anemia in chronic kidney disease: Secondary | ICD-10-CM | POA: Diagnosis not present

## 2021-02-05 DIAGNOSIS — Z992 Dependence on renal dialysis: Secondary | ICD-10-CM | POA: Diagnosis not present

## 2021-02-06 DIAGNOSIS — D509 Iron deficiency anemia, unspecified: Secondary | ICD-10-CM | POA: Diagnosis not present

## 2021-02-06 DIAGNOSIS — D631 Anemia in chronic kidney disease: Secondary | ICD-10-CM | POA: Diagnosis not present

## 2021-02-06 DIAGNOSIS — Z992 Dependence on renal dialysis: Secondary | ICD-10-CM | POA: Diagnosis not present

## 2021-02-06 DIAGNOSIS — N2581 Secondary hyperparathyroidism of renal origin: Secondary | ICD-10-CM | POA: Diagnosis not present

## 2021-02-06 DIAGNOSIS — N186 End stage renal disease: Secondary | ICD-10-CM | POA: Diagnosis not present

## 2021-02-07 DIAGNOSIS — Z992 Dependence on renal dialysis: Secondary | ICD-10-CM | POA: Diagnosis not present

## 2021-02-07 DIAGNOSIS — N2581 Secondary hyperparathyroidism of renal origin: Secondary | ICD-10-CM | POA: Diagnosis not present

## 2021-02-07 DIAGNOSIS — D631 Anemia in chronic kidney disease: Secondary | ICD-10-CM | POA: Diagnosis not present

## 2021-02-07 DIAGNOSIS — N186 End stage renal disease: Secondary | ICD-10-CM | POA: Diagnosis not present

## 2021-02-07 DIAGNOSIS — D509 Iron deficiency anemia, unspecified: Secondary | ICD-10-CM | POA: Diagnosis not present

## 2021-02-08 DIAGNOSIS — Z992 Dependence on renal dialysis: Secondary | ICD-10-CM | POA: Diagnosis not present

## 2021-02-08 DIAGNOSIS — D631 Anemia in chronic kidney disease: Secondary | ICD-10-CM | POA: Diagnosis not present

## 2021-02-08 DIAGNOSIS — N2581 Secondary hyperparathyroidism of renal origin: Secondary | ICD-10-CM | POA: Diagnosis not present

## 2021-02-08 DIAGNOSIS — N186 End stage renal disease: Secondary | ICD-10-CM | POA: Diagnosis not present

## 2021-02-08 DIAGNOSIS — D509 Iron deficiency anemia, unspecified: Secondary | ICD-10-CM | POA: Diagnosis not present

## 2021-02-08 DIAGNOSIS — E1142 Type 2 diabetes mellitus with diabetic polyneuropathy: Secondary | ICD-10-CM | POA: Diagnosis not present

## 2021-02-08 DIAGNOSIS — B351 Tinea unguium: Secondary | ICD-10-CM | POA: Diagnosis not present

## 2021-02-09 DIAGNOSIS — D631 Anemia in chronic kidney disease: Secondary | ICD-10-CM | POA: Diagnosis not present

## 2021-02-09 DIAGNOSIS — N186 End stage renal disease: Secondary | ICD-10-CM | POA: Diagnosis not present

## 2021-02-09 DIAGNOSIS — Z992 Dependence on renal dialysis: Secondary | ICD-10-CM | POA: Diagnosis not present

## 2021-02-09 DIAGNOSIS — D509 Iron deficiency anemia, unspecified: Secondary | ICD-10-CM | POA: Diagnosis not present

## 2021-02-09 DIAGNOSIS — N2581 Secondary hyperparathyroidism of renal origin: Secondary | ICD-10-CM | POA: Diagnosis not present

## 2021-02-10 DIAGNOSIS — D509 Iron deficiency anemia, unspecified: Secondary | ICD-10-CM | POA: Diagnosis not present

## 2021-02-10 DIAGNOSIS — N186 End stage renal disease: Secondary | ICD-10-CM | POA: Diagnosis not present

## 2021-02-10 DIAGNOSIS — N2581 Secondary hyperparathyroidism of renal origin: Secondary | ICD-10-CM | POA: Diagnosis not present

## 2021-02-10 DIAGNOSIS — D631 Anemia in chronic kidney disease: Secondary | ICD-10-CM | POA: Diagnosis not present

## 2021-02-10 DIAGNOSIS — Z992 Dependence on renal dialysis: Secondary | ICD-10-CM | POA: Diagnosis not present

## 2021-02-11 DIAGNOSIS — N186 End stage renal disease: Secondary | ICD-10-CM | POA: Diagnosis not present

## 2021-02-11 DIAGNOSIS — N2581 Secondary hyperparathyroidism of renal origin: Secondary | ICD-10-CM | POA: Diagnosis not present

## 2021-02-11 DIAGNOSIS — D631 Anemia in chronic kidney disease: Secondary | ICD-10-CM | POA: Diagnosis not present

## 2021-02-11 DIAGNOSIS — Z992 Dependence on renal dialysis: Secondary | ICD-10-CM | POA: Diagnosis not present

## 2021-02-11 DIAGNOSIS — D509 Iron deficiency anemia, unspecified: Secondary | ICD-10-CM | POA: Diagnosis not present

## 2021-02-12 DIAGNOSIS — Z992 Dependence on renal dialysis: Secondary | ICD-10-CM | POA: Diagnosis not present

## 2021-02-12 DIAGNOSIS — N2581 Secondary hyperparathyroidism of renal origin: Secondary | ICD-10-CM | POA: Diagnosis not present

## 2021-02-12 DIAGNOSIS — D509 Iron deficiency anemia, unspecified: Secondary | ICD-10-CM | POA: Diagnosis not present

## 2021-02-12 DIAGNOSIS — D631 Anemia in chronic kidney disease: Secondary | ICD-10-CM | POA: Diagnosis not present

## 2021-02-12 DIAGNOSIS — N186 End stage renal disease: Secondary | ICD-10-CM | POA: Diagnosis not present

## 2021-02-13 DIAGNOSIS — N186 End stage renal disease: Secondary | ICD-10-CM | POA: Diagnosis not present

## 2021-02-13 DIAGNOSIS — N2581 Secondary hyperparathyroidism of renal origin: Secondary | ICD-10-CM | POA: Diagnosis not present

## 2021-02-13 DIAGNOSIS — D509 Iron deficiency anemia, unspecified: Secondary | ICD-10-CM | POA: Diagnosis not present

## 2021-02-13 DIAGNOSIS — D631 Anemia in chronic kidney disease: Secondary | ICD-10-CM | POA: Diagnosis not present

## 2021-02-13 DIAGNOSIS — Z992 Dependence on renal dialysis: Secondary | ICD-10-CM | POA: Diagnosis not present

## 2021-02-14 ENCOUNTER — Ambulatory Visit: Payer: Medicare Other | Admitting: Pulmonary Disease

## 2021-02-14 DIAGNOSIS — Z992 Dependence on renal dialysis: Secondary | ICD-10-CM | POA: Diagnosis not present

## 2021-02-14 DIAGNOSIS — N186 End stage renal disease: Secondary | ICD-10-CM | POA: Diagnosis not present

## 2021-02-14 DIAGNOSIS — D631 Anemia in chronic kidney disease: Secondary | ICD-10-CM | POA: Diagnosis not present

## 2021-02-14 DIAGNOSIS — D509 Iron deficiency anemia, unspecified: Secondary | ICD-10-CM | POA: Diagnosis not present

## 2021-02-15 ENCOUNTER — Other Ambulatory Visit: Payer: Self-pay | Admitting: Nurse Practitioner

## 2021-02-15 DIAGNOSIS — D631 Anemia in chronic kidney disease: Secondary | ICD-10-CM | POA: Diagnosis not present

## 2021-02-15 DIAGNOSIS — D509 Iron deficiency anemia, unspecified: Secondary | ICD-10-CM | POA: Diagnosis not present

## 2021-02-15 DIAGNOSIS — Z992 Dependence on renal dialysis: Secondary | ICD-10-CM | POA: Diagnosis not present

## 2021-02-15 DIAGNOSIS — N186 End stage renal disease: Secondary | ICD-10-CM | POA: Diagnosis not present

## 2021-02-16 DIAGNOSIS — N186 End stage renal disease: Secondary | ICD-10-CM | POA: Diagnosis not present

## 2021-02-16 DIAGNOSIS — Z992 Dependence on renal dialysis: Secondary | ICD-10-CM | POA: Diagnosis not present

## 2021-02-16 DIAGNOSIS — D509 Iron deficiency anemia, unspecified: Secondary | ICD-10-CM | POA: Diagnosis not present

## 2021-02-16 DIAGNOSIS — D631 Anemia in chronic kidney disease: Secondary | ICD-10-CM | POA: Diagnosis not present

## 2021-02-17 ENCOUNTER — Other Ambulatory Visit: Payer: Self-pay

## 2021-02-17 ENCOUNTER — Ambulatory Visit (INDEPENDENT_AMBULATORY_CARE_PROVIDER_SITE_OTHER): Payer: Medicare Other | Admitting: Internal Medicine

## 2021-02-17 ENCOUNTER — Encounter: Payer: Self-pay | Admitting: Internal Medicine

## 2021-02-17 VITALS — BP 120/72 | HR 67 | Temp 97.3°F | Ht 64.0 in | Wt 233.6 lb

## 2021-02-17 DIAGNOSIS — K219 Gastro-esophageal reflux disease without esophagitis: Secondary | ICD-10-CM | POA: Diagnosis not present

## 2021-02-17 DIAGNOSIS — K5901 Slow transit constipation: Secondary | ICD-10-CM | POA: Diagnosis not present

## 2021-02-17 DIAGNOSIS — D631 Anemia in chronic kidney disease: Secondary | ICD-10-CM | POA: Diagnosis not present

## 2021-02-17 DIAGNOSIS — Z992 Dependence on renal dialysis: Secondary | ICD-10-CM | POA: Diagnosis not present

## 2021-02-17 DIAGNOSIS — K760 Fatty (change of) liver, not elsewhere classified: Secondary | ICD-10-CM | POA: Diagnosis not present

## 2021-02-17 DIAGNOSIS — N186 End stage renal disease: Secondary | ICD-10-CM | POA: Diagnosis not present

## 2021-02-17 DIAGNOSIS — D509 Iron deficiency anemia, unspecified: Secondary | ICD-10-CM | POA: Diagnosis not present

## 2021-02-17 NOTE — Patient Instructions (Signed)
Happy to hear you are doing well.  Continue on pantoprazole for your acid reflux.  We will tentatively plan on repeat colonoscopy in 2026 for screening purposes.  If you begin having issues with constipation then let us know and we can start you on medication for this.  Otherwise follow-up as needed.  It was nice meeting both you today.  Dr. Abbey [name]  At [facility] we value your feedback. You may receive a survey about your visit today. Please share your experience as we strive to create trusting relationships with our patients to provide genuine, compassionate, quality care.  We appreciate your understanding and patience as we review any laboratory studies, imaging, and other diagnostic tests that are ordered as we care for you. Our office policy is 5 business days for review of these results, and any emergent or urgent results are addressed in a timely manner for your best interest. If you do not hear from our office in 1 week, please contact us.   We also encourage the use of MyChart, which contains your medical information for your review as well. If you are not enrolled in this feature, an access code is on this after visit summary for your convenience. Thank you for allowing [place] to be involved in your care.  It was great to see you today!  I hope you have a great rest of your summer!!    [name] [name]. [name] [name], D.O. Gastroenterology and Hepatology [facility]

## 2021-02-17 NOTE — Progress Notes (Signed)
Referring Provider: [name], MD Primary Care Physician:  [name], MD Primary GI:  Dr. Abbey Chatters  Chief Complaint  Patient presents with   Gastroesophageal Reflux    Doing fine, no abd pain    HPI:   [name] [name] is a 70 y.o. male who presents to clinic today for follow-up visit.  Last seen over a year ago.  Has chronic GERD which is well controlled on pantoprazole 40 mg daily.  Denies any breakthrough symptoms, chest pain, epigastric pain, dysphagia, odynophagia.  Has chronic constipation listed.  Previously was on fiber and over-the-counter senna.  States his bowel movements are normal now.  Not having constipation anymore.  No longer requiring medications or fiber therapy.  Does have a history of fatty liver disease seen on ultrasound as well.  1 liver cyst which was unchanged with follow-up imaging.  Past Medical History:  Diagnosis Date   Anemia    Arthritis    Asthma    Bell palsy    CAD (coronary artery disease)    a. 2014 MV: abnl w/ infap ischemia; b. 03/2013 Cath: aneurysmal bleb in the LAD w/ otw nonobs dzs-->Med Rx.   Chronic back pain    Chronic knee pain    a. 09/2015 s/p R TKA.   Chronic pain    Chronic shoulder pain    Chronic sinusitis    CKD (chronic kidney disease), stage IV (HCC)    stage 4 per office visit note of Dr Lowanda Foster on 05/2015    COPD (chronic obstructive pulmonary disease) ([place])    Diabetes mellitus without complication ([place])    type II    Essential hypertension    GERD (gastroesophageal reflux disease)    Gout    Gout    Hepatomegaly    noted on noncontrast CT 2015   History of hiatal hernia    Hyperlipidemia    Lateral meniscus tear    Obesity    Truncal   Obstructive sleep apnea    does not use cpap    On home oxygen therapy    uses 2l when is going somewhere per patient    PUD (peptic ulcer disease)    remote, reports f/u EGD about 8 years ago unremarkable    Reactive airway disease    related to exposure to  chemical during 9/11   Renal insufficiency    Sinusitis    Vitamin D deficiency     Past Surgical History:  Procedure Laterality Date   ASAD LT SHOULDER  12/2008   left shoulder   AV FISTULA PLACEMENT Left 08/09/2016   Procedure: BRACHIOCEPHALIC ARTERIOVENOUS (AV) FISTULA CREATION LEFT ARM;  Surgeon: [name], MD;  Location: [facility];  Service: Vascular;  Laterality: Left;   CAPD INSERTION N/A 10/07/2018   Procedure: LAPAROSCOPIC PERITONEAL CATHETER PLACEMENT;  Surgeon: Mickeal Skinner, MD;  Location: [facility];  Service: General;  Laterality: N/A;   CATARACT EXTRACTION W/PHACO Left 03/28/2016   Procedure: CATARACT EXTRACTION PHACO AND INTRAOCULAR LENS PLACEMENT LEFT EYE;  Surgeon: [name], MD;  Location: [facility];  Service: Ophthalmology;  Laterality: Left;  CDE: 4.77   CATARACT EXTRACTION W/PHACO Right 04/11/2016   Procedure: CATARACT EXTRACTION PHACO AND INTRAOCULAR LENS PLACEMENT RIGHT EYE; CDE:  4.74;  Surgeon: [name], MD;  Location: [facility];  Service: Ophthalmology;  Laterality: Right;   COLONOSCOPY  10/2008   Fields: Rectal polyp obliterated, not retrieved, hemorrhoids, single ascending colon diverticulum near the CV. Next colonoscopy April  2020   COLONOSCOPY N/A 12/25/2014   SLF: 1. Colorectal polyps (2) removed 2. Small internal hemorrhoids 3. the left colon is severely redundant   DOPPLER ECHOCARDIOGRAPHY     ESOPHAGOGASTRODUODENOSCOPY N/A 12/25/2014   SLF: 1. Anemia most likely due to CRI, gastritis, gastric polyps 2. Moderate non-erosive gastriits and mild duodenitis.  3.TWo large gstric polyps removed.    EYE SURGERY  12/22/2010   tear duct probing-[place]   FOREIGN BODY REMOVAL  03/29/2011   Procedure: REMOVAL FOREIGN BODY EXTREMITY;  Surgeon: Arther Abbott, MD;  Location: [facility];  Service: Orthopedics;  Laterality: Right;  Removal Foreign Body Right Thumb   IR FLUORO GUIDE CV LINE RIGHT  08/06/2018   IR US GUIDE VASC ACCESS RIGHT  08/06/2018   KNEE ARTHROSCOPY   10/2007   left   KNEE ARTHROSCOPY WITH LATERAL MENISECTOMY Right 10/14/2015   Procedure: LEFT KNEE ARTHROSCOPY WITH PARTIAL LATERAL MENISECTOMY;  Surgeon: Carole Civil, MD;  Location: [facility];  Service: Orthopedics;  Laterality: Right;   LEFT HEART CATHETERIZATION WITH CORONARY ANGIOGRAM N/A 03/28/2013   Procedure: LEFT HEART CATHETERIZATION WITH CORONARY ANGIOGRAM;  Surgeon: Leonie Man, MD;  Location: [facility] C[facility];  Service: Cardiovascular;  Laterality: N/A;   NM MYOVIEW LTD     PENILE PROSTHESIS IMPLANT N/A 08/16/2015   Procedure: PENILE PROTHESIS INFLATABLE, three piece, Excisional biopsy of Penile ulcer, Penile molding;  Surgeon: Carolan Clines, MD;  Location: [facility];  Service: Urology;  Laterality: N/A;   PENILE PROSTHESIS IMPLANT N/A 12/24/2017   Procedure: REMOVAL AND  REPLACEMENT  COLOPLAST PENILE PROSTHESIS;  Surgeon: Lucas Mallow, MD;  Location: [facility];  Service: Urology;  Laterality: N/A;   QUADRICEPS TENDON REPAIR  07/21/2011   Procedure: REPAIR QUADRICEP TENDON;  Surgeon: Arther Abbott, MD;  Location: [facility];  Service: Orthopedics;  Laterality: Right;   TOENAIL EXCISION     removed [id number]   UMBILICAL HERNIA REPAIR  2007   [place]    Current Outpatient Medications  Medication Sig Dispense Refill   amLODipine (NORVASC) 5 MG tablet Take 1 tablet (5 mg total) by mouth daily. 90 tablet 3   aspirin EC 81 MG tablet Take 81 mg by mouth daily. Swallow whole.     budesonide-formoterol (SYMBICORT) 160-4.5 MCG/ACT inhaler Inhale 2 puffs into the lungs 2 (two) times daily. 1 each 6   calcitRIOL (ROCALTROL) 0.25 MCG capsule Take 0.25 mcg by mouth daily.     fluticasone (FLONASE) 50 MCG/ACT nasal spray Place 2 sprays into both nostrils daily. 16 g 0   Insulin Pen Needle (DROPLET PEN NEEDLES) 31G X 5 MM MISC Use to inject insulin daily as directed 150 each 2   LANTUS SOLOSTAR 100 UNIT/ML Solostar Pen INJECT  30 UNITS SUBCUTANEOUSLY AT BEDTIME 30 mL 1   losartan (COZAAR)  100 MG tablet      metoprolol succinate (TOPROL-XL) 25 MG 24 hr tablet Take 25 mg by mouth daily.     mometasone (NASONEX) 50 MCG/ACT nasal spray Place 2 sprays into the nose 2 (two) times daily as needed (allergies).     pantoprazole (PROTONIX) 40 MG tablet TAKE 1 TABLET TWICE DAILY 30 MINUTES PRIOR TO MEALS 180 tablet 1   simvastatin (ZOCOR) 20 MG tablet Take 20 mg by mouth every morning.     TRADJENTA 5 MG TABS tablet TAKE 1 TABLET EVERY DAY 90 tablet 0   No current facility-administered medications for this visit.    Allergies as of 02/17/2021 - Review Complete  02/17/2021  Allergen Reaction Noted   Tramadol hcl  02/27/2018   Opana [oxymorphone hcl] Itching 01/15/2014   Oxymorphone Itching 01/15/2014   Tramadol Itching 07/10/2011    Family History  Problem Relation Age of Onset   Hypertension Mother        MI   Cancer Mother        breast    Diabetes Mother    Diabetes Father    Hypertension Father    Hypertension Sister    Diabetes Sister    Arthritis Other    Asthma Other    Lung disease Other    Anesthesia problems Neg Hx    Hypotension Neg Hx    Malignant hyperthermia Neg Hx    Pseudochol deficiency Neg Hx    Colon cancer Neg Hx     Social History   Socioeconomic History   Marital status: Married    Spouse name: Not on file   Number of children: 2   Years of education: 12th grade   Highest education level: Not on file  Occupational History   Occupation: disabled   Occupation:      Fish farm manager: UNEMPLOYED  Tobacco Use   Smoking status: Former    Packs/day: 1.00    Years: 25.00    Pack years: 25.00    Types: Cigarettes    Quit date: 03/27/2010    Years since quitting: 10.9   Smokeless tobacco: Never   Tobacco comments:    Quit x 7 years  Vaping Use   Vaping Use: Never used  Substance and Sexual Activity   Alcohol use: Yes    Alcohol/week: 0.0 standard drinks    Comment: occasionally   Drug use: Not Currently    Types: Marijuana    Comment:  cocaine- last time used- 11/24/2017 , marijuana-    Sexual activity: Not on file  Other Topics Concern   Not on file  Social History Narrative   He quit smoking in 2010. 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 He developed pulmonary problems, became disabled because of lower airway disease in 2009.       WATCHES BASKETBALL. HIS TEAM IS [employer].   Social Determinants of Health   Financial Resource Strain: Not on file  Food Insecurity: Not on file  Transportation Needs: Not on file  Physical Activity: Not on file  Stress: Not on file  Social Connections: Not on file    Subjective: Review of Systems  Constitutional:  Negative for chills and fever.  HENT:  Negative for congestion and hearing loss.   Eyes:  Negative for blurred vision and double vision.  Respiratory:  Negative for cough and shortness of breath.   Cardiovascular:  Negative for chest pain and palpitations.  Gastrointestinal:  Negative for abdominal pain, blood in stool, constipation, diarrhea, heartburn, melena and vomiting.  Genitourinary:  Negative for dysuria and urgency.  Musculoskeletal:  Negative for joint pain and myalgias.  Skin:  Negative for itching and rash.  Neurological:  Negative for dizziness and headaches.  Psychiatric/Behavioral:  Negative for depression. The patient is not nervous/anxious.     Objective: BP 120/72   Pulse 67   Temp (!) 97.3 F (36.3 C)   Ht 5\' 4"  (1.626 m)   Wt 233 lb 9.6 oz (106 kg)   BMI 40.10 kg/m  Physical Exam Constitutional:      Appearance: Normal appearance. He is obese.  HENT:     Head: Normocephalic and  atraumatic.  Eyes:     Extraocular Movements: Extraocular movements intact.     Conjunctiva/sclera: Conjunctivae normal.  Cardiovascular:     Rate and Rhythm: Normal rate and regular rhythm.  Pulmonary:     Effort: Pulmonary effort is normal.     Breath sounds: Normal breath sounds.  Abdominal:     General: Bowel  sounds are normal.     Palpations: Abdomen is soft.  Musculoskeletal:        General: Normal range of motion.     Cervical back: Normal range of motion and neck supple.  Skin:    General: Skin is warm.  Neurological:     General: No focal deficit present.     Mental Status: He is alert and oriented to person, place, and time.  Psychiatric:        Mood and Affect: Mood normal.        Behavior: Behavior normal.     Assessment: *GERD-well-controlled on pantoprazole *Constipation-well-controlled *Fatty liver disease  Plan: Patient's GERD well-controlled on pantoprazole.  We will continue.  No alarm symptoms including dysphagia, odynophagia, epigastric pain today.  Continue to monitor.  Constipation improved with diet.  Not currently requiring any over-the-counter medications for this.  Continue to monitor.  For fatty liver disease, recommend 1-2# weight loss per week until ideal body weight through exercise & diet. Low fat/cholesterol diet.   Avoid sweets, sodas, fruit juices, sweetened beverages like tea, etc. Gradually increase exercise from 15 min daily up to 1 hr per day 5 days/week. Limit alcohol use.  Patient to follow-up as needed peer   02/17/2021 1:39 PM   Disclaimer: This note was dictated with voice recognition software. Similar sounding words can inadvertently be transcribed and may not be corrected upon review.

## 2021-02-18 DIAGNOSIS — D631 Anemia in chronic kidney disease: Secondary | ICD-10-CM | POA: Diagnosis not present

## 2021-02-18 DIAGNOSIS — D509 Iron deficiency anemia, unspecified: Secondary | ICD-10-CM | POA: Diagnosis not present

## 2021-02-18 DIAGNOSIS — N186 End stage renal disease: Secondary | ICD-10-CM | POA: Diagnosis not present

## 2021-02-18 DIAGNOSIS — Z992 Dependence on renal dialysis: Secondary | ICD-10-CM | POA: Diagnosis not present

## 2021-02-19 DIAGNOSIS — D631 Anemia in chronic kidney disease: Secondary | ICD-10-CM | POA: Diagnosis not present

## 2021-02-19 DIAGNOSIS — Z992 Dependence on renal dialysis: Secondary | ICD-10-CM | POA: Diagnosis not present

## 2021-02-19 DIAGNOSIS — D509 Iron deficiency anemia, unspecified: Secondary | ICD-10-CM | POA: Diagnosis not present

## 2021-02-19 DIAGNOSIS — N186 End stage renal disease: Secondary | ICD-10-CM | POA: Diagnosis not present

## 2021-02-20 DIAGNOSIS — Z992 Dependence on renal dialysis: Secondary | ICD-10-CM | POA: Diagnosis not present

## 2021-02-20 DIAGNOSIS — D509 Iron deficiency anemia, unspecified: Secondary | ICD-10-CM | POA: Diagnosis not present

## 2021-02-20 DIAGNOSIS — D631 Anemia in chronic kidney disease: Secondary | ICD-10-CM | POA: Diagnosis not present

## 2021-02-20 DIAGNOSIS — N186 End stage renal disease: Secondary | ICD-10-CM | POA: Diagnosis not present

## 2021-02-21 DIAGNOSIS — N186 End stage renal disease: Secondary | ICD-10-CM | POA: Diagnosis not present

## 2021-02-21 DIAGNOSIS — D509 Iron deficiency anemia, unspecified: Secondary | ICD-10-CM | POA: Diagnosis not present

## 2021-02-21 DIAGNOSIS — D631 Anemia in chronic kidney disease: Secondary | ICD-10-CM | POA: Diagnosis not present

## 2021-02-21 DIAGNOSIS — Z992 Dependence on renal dialysis: Secondary | ICD-10-CM | POA: Diagnosis not present

## 2021-02-22 ENCOUNTER — Telehealth: Payer: Self-pay

## 2021-02-22 DIAGNOSIS — D509 Iron deficiency anemia, unspecified: Secondary | ICD-10-CM | POA: Diagnosis not present

## 2021-02-22 DIAGNOSIS — Z992 Dependence on renal dialysis: Secondary | ICD-10-CM | POA: Diagnosis not present

## 2021-02-22 DIAGNOSIS — E1122 Type 2 diabetes mellitus with diabetic chronic kidney disease: Secondary | ICD-10-CM

## 2021-02-22 DIAGNOSIS — D631 Anemia in chronic kidney disease: Secondary | ICD-10-CM | POA: Diagnosis not present

## 2021-02-22 DIAGNOSIS — N185 Chronic kidney disease, stage 5: Secondary | ICD-10-CM

## 2021-02-22 DIAGNOSIS — N186 End stage renal disease: Secondary | ICD-10-CM | POA: Diagnosis not present

## 2021-02-22 MED ORDER — GLUCOSE BLOOD VI STRP: 1.0000 | ORAL_STRIP | Freq: Two times a day (BID) | 2 refills | Status: DC

## 2021-02-22 NOTE — Telephone Encounter (Signed)
Rx sent 

## 2021-02-22 NOTE — Telephone Encounter (Signed)
Pt requesting ultra touch strips. He uses [facility] on [address].

## 2021-02-23 DIAGNOSIS — Z992 Dependence on renal dialysis: Secondary | ICD-10-CM | POA: Diagnosis not present

## 2021-02-23 DIAGNOSIS — N186 End stage renal disease: Secondary | ICD-10-CM | POA: Diagnosis not present

## 2021-02-23 DIAGNOSIS — D631 Anemia in chronic kidney disease: Secondary | ICD-10-CM | POA: Diagnosis not present

## 2021-02-23 DIAGNOSIS — D509 Iron deficiency anemia, unspecified: Secondary | ICD-10-CM | POA: Diagnosis not present

## 2021-02-24 DIAGNOSIS — D509 Iron deficiency anemia, unspecified: Secondary | ICD-10-CM | POA: Diagnosis not present

## 2021-02-24 DIAGNOSIS — D631 Anemia in chronic kidney disease: Secondary | ICD-10-CM | POA: Diagnosis not present

## 2021-02-24 DIAGNOSIS — Z992 Dependence on renal dialysis: Secondary | ICD-10-CM | POA: Diagnosis not present

## 2021-02-24 DIAGNOSIS — N186 End stage renal disease: Secondary | ICD-10-CM | POA: Diagnosis not present

## 2021-02-25 DIAGNOSIS — N186 End stage renal disease: Secondary | ICD-10-CM | POA: Diagnosis not present

## 2021-02-25 DIAGNOSIS — D509 Iron deficiency anemia, unspecified: Secondary | ICD-10-CM | POA: Diagnosis not present

## 2021-02-25 DIAGNOSIS — D631 Anemia in chronic kidney disease: Secondary | ICD-10-CM | POA: Diagnosis not present

## 2021-02-25 DIAGNOSIS — Z992 Dependence on renal dialysis: Secondary | ICD-10-CM | POA: Diagnosis not present

## 2021-02-26 DIAGNOSIS — Z992 Dependence on renal dialysis: Secondary | ICD-10-CM | POA: Diagnosis not present

## 2021-02-26 DIAGNOSIS — D631 Anemia in chronic kidney disease: Secondary | ICD-10-CM | POA: Diagnosis not present

## 2021-02-26 DIAGNOSIS — N186 End stage renal disease: Secondary | ICD-10-CM | POA: Diagnosis not present

## 2021-02-26 DIAGNOSIS — D509 Iron deficiency anemia, unspecified: Secondary | ICD-10-CM | POA: Diagnosis not present

## 2021-02-27 DIAGNOSIS — Z992 Dependence on renal dialysis: Secondary | ICD-10-CM | POA: Diagnosis not present

## 2021-02-27 DIAGNOSIS — D631 Anemia in chronic kidney disease: Secondary | ICD-10-CM | POA: Diagnosis not present

## 2021-02-27 DIAGNOSIS — N186 End stage renal disease: Secondary | ICD-10-CM | POA: Diagnosis not present

## 2021-02-27 DIAGNOSIS — D509 Iron deficiency anemia, unspecified: Secondary | ICD-10-CM | POA: Diagnosis not present

## 2021-02-28 DIAGNOSIS — N186 End stage renal disease: Secondary | ICD-10-CM | POA: Diagnosis not present

## 2021-02-28 DIAGNOSIS — D509 Iron deficiency anemia, unspecified: Secondary | ICD-10-CM | POA: Diagnosis not present

## 2021-02-28 DIAGNOSIS — D631 Anemia in chronic kidney disease: Secondary | ICD-10-CM | POA: Diagnosis not present

## 2021-02-28 DIAGNOSIS — Z992 Dependence on renal dialysis: Secondary | ICD-10-CM | POA: Diagnosis not present

## 2021-03-01 ENCOUNTER — Other Ambulatory Visit: Payer: Self-pay

## 2021-03-01 ENCOUNTER — Encounter: Payer: Self-pay | Admitting: Internal Medicine

## 2021-03-01 ENCOUNTER — Ambulatory Visit (INDEPENDENT_AMBULATORY_CARE_PROVIDER_SITE_OTHER): Payer: Medicare Other | Admitting: Internal Medicine

## 2021-03-01 VITALS — BP 128/88 | HR 69 | Ht 64.0 in | Wt 235.4 lb

## 2021-03-01 DIAGNOSIS — D631 Anemia in chronic kidney disease: Secondary | ICD-10-CM | POA: Diagnosis not present

## 2021-03-01 DIAGNOSIS — Z992 Dependence on renal dialysis: Secondary | ICD-10-CM | POA: Diagnosis not present

## 2021-03-01 DIAGNOSIS — G4733 Obstructive sleep apnea (adult) (pediatric): Secondary | ICD-10-CM | POA: Diagnosis not present

## 2021-03-01 DIAGNOSIS — E782 Mixed hyperlipidemia: Secondary | ICD-10-CM | POA: Diagnosis not present

## 2021-03-01 DIAGNOSIS — N186 End stage renal disease: Secondary | ICD-10-CM | POA: Diagnosis not present

## 2021-03-01 DIAGNOSIS — D509 Iron deficiency anemia, unspecified: Secondary | ICD-10-CM | POA: Diagnosis not present

## 2021-03-01 DIAGNOSIS — I2510 Atherosclerotic heart disease of native coronary artery without angina pectoris: Secondary | ICD-10-CM

## 2021-03-01 DIAGNOSIS — Z9989 Dependence on other enabling machines and devices: Secondary | ICD-10-CM | POA: Diagnosis not present

## 2021-03-01 NOTE — Patient Instructions (Signed)
  Follow-Up: At [facility], you and your health needs are our priority.  As part of our continuing mission to provide you with exceptional heart care, we have created designated Provider Care Teams.  These Care Teams include your primary Cardiologist (physician) and Advanced Practice Providers (APPs -  Physician Assistants and Nurse Practitioners) who all work together to provide you with the care you need, when you need it.  We recommend signing up for the patient portal called "MyChart".  Sign up information is provided on this After Visit Summary.  MyChart is used to connect with patients for Virtual Visits (Telemedicine).  Patients are able to view lab/test results, encounter notes, upcoming appointments, etc.  Non-urgent messages can be sent to your provider as well.   To learn more about what you can do with MyChart, go to [email or web address].    Your next appointment:   12 month(s)  The format for your next appointment:   In Person  Provider:   K. Mali Hilty, MD

## 2021-03-01 NOTE — Progress Notes (Signed)
OFFICE NOTE  Chief Complaint:  No complaint  Primary Care Physician: [name], MD  HPI:  [name] [name] Is a pleasant 70 year old male with a history of insulin-dependent diabetes, dyslipidemia, and hypertension. He's been a diabetic since 1999. He's been on insulin for only 3-4 years. He was a former patient of Dr. Rollene Fare who ordered a stress test in 2014. This demonstrated reversible inferoapical ischemia and was read as moderate risk. He ultimately underwent heart catheterization by Dr. Ellyn Hack on 03/28/2013 which demonstrated a small, possibly minimal aneurysmal bleb in the LAD but no significant disease. There is a dominant RCA with no disease. Essentially there was no significant obstructive coronary disease. At the time his EKG did show inferior T-wave abnormalities and very small R waves but no evidence for Q waves. Today he is referred back for abnormalities EKG which directly compared to his old EKG shows no significant change. There is an IVCD and very small R waves inferiorly, which the computer may have this read as inferior infarct. I do not see any significant change compared to his previous studies. He denies any new or worsening chest pain or shortness of breath.  [name] [name] returns today for follow-up. Overall he is feeling fairly well. He denies any chest pain or shortness of breath. Blood pressure is slightly elevated to 162/94, however recheck was 140/84. Please not been able to lose much weight. He is on chronic Plavix therapy due to what was thought to be plaque rupture but did not require stent by catheterization in 2014. This was a preferred agent over aspirin and he's remained on that, but certainly could come off of that if he were to undergo any procedures he is contemplating a penile implant for erectile dysfunction.  05/01/2016  [name] [name] was seen today in follow-up. He recently saw [name], NP, in April 2016 for follow-up. Blood pressure was  somewhat elevated at the time. It was not is ambulatory due to problems with his knee. There was a discussion about stopping Plavix and keeping him on aspirin however he remained on Plavix. Unfortunately he has stage IV chronic kidney disease and there is discussion about placement of a fistula. He scheduled for upper extremity Dopplers at the end of this month and follow-up with Dr. Oneida Alar. He had a number of questions today regarding dialysis and his fistula, but I reassured him based on his creatinine that he should consider this even if his numbers seem to have stabilized. He denies any chest pain or worsening shortness of breath. He did get new CPAP equipment but reports intermittent compliance with it. He still has some daytime fatigue.  10/27/2016  [name] [name] was seen today in follow-up. He underwent recent placement of a fistula left upper arm. This has a positive thrill today. Blood pressure is elevated somewhat 152/88. Recently his creatinine was increased up to 5.08 which is progression from 3.74 in December. I suspect he will have to start on dialysis soon. Otherwise hemoglobin A1c is stable around 7.0. He is cholesterol also has looked reasonably well controlled with total cholesterol 146, HDL-C 36, LDL-C 72 and triglycerides 190.  11/13/2017  [name] [name] was seen today for routine follow-up.  Overall he is doing very well.  He denies any chest pain or worsening shortness of breath.  His creatinine remains around 5.7.  This is been fairly stable and he is managed to remain off dialysis.  He does have left upper extremity fistula.  Hemoglobin A1c is  well-controlled at 6.2 and is followed by Dr. Dorris Fetch in [place].  His total cholesterol was 110, HDL 38, LDL 54 triglycerides 101, demonstrating excellent control on simvastatin.  Blood pressure is at goal today 136/78.  11/14/2019  [name] [name] is seen today in follow-up.  He continues on home peritoneal dialysis.  Blood pressure was noted to be  very low today at 86/56.  He denies any fevers or chills or other signs or symptoms of infection.  His dialysis center had recommended stopping his hydralazine.  Despite this his blood pressure remains low.  He is also on amlodipine, metoprolol and losartan.  He reports some fatigue but denies chest pain or shortness of breath.  01/28/2020  [name] [name] is seen today in follow-up.  Overall he continues to do well.  He is on PD which he is now doing at night.  He is overdue for a lipid profile which we will order.  He says he stopped Plavix and aspirin.  He has multiple indications for this including diabetes and prior history of coronary disease.  He denies any chest pain or shortness of breath.  Blood pressures well controlled today 120/70 and at times at home can be as low as the 65H systolic but he is asymptomatic with this.  03/01/2021  [name] [name] returns today for follow-up.  Overall he says he is doing well.  He continues with home peritoneal dialysis.  Denies any chest pain.  He has had some worsening shortness of breath but primarily I suspect this is due to wheezing and chronic lung disease.  He has a follow-up with pulmonary in September.  Cholesterol is fairly low with total 117, triglycerides 157, HDL 27 and LDL 63.  Blood pressure appears to be well controlled today 128/88.  EKG is unchanged showing sinus rhythm with first-degree AV block and nonspecific T wave changes.  PMHx:  Past Medical History:  Diagnosis Date   Anemia    Arthritis    Asthma    Bell palsy    CAD (coronary artery disease)    a. 2014 MV: abnl w/ infap ischemia; b. 03/2013 Cath: aneurysmal bleb in the LAD w/ otw nonobs dzs-->Med Rx.   Chronic back pain    Chronic knee pain    a. 09/2015 s/p R TKA.   Chronic pain    Chronic shoulder pain    Chronic sinusitis    CKD (chronic kidney disease), stage IV (HCC)    stage 4 per office visit note of Dr Lowanda Foster on 05/2015    COPD (chronic obstructive pulmonary disease)  ([place])    Diabetes mellitus without complication ([place])    type II    Essential hypertension    GERD (gastroesophageal reflux disease)    Gout    Gout    Hepatomegaly    noted on noncontrast CT 2015   History of hiatal hernia    Hyperlipidemia    Lateral meniscus tear    Obesity    Truncal   Obstructive sleep apnea    does not use cpap    On home oxygen therapy    uses 2l when is going somewhere per patient    PUD (peptic ulcer disease)    remote, reports f/u EGD about 8 years ago unremarkable    Reactive airway disease    related to exposure to chemical during 9/11   Renal insufficiency    Sinusitis    Vitamin D deficiency     Past Surgical History:  Procedure  Laterality Date   ASAD LT SHOULDER  12/2008   left shoulder   AV FISTULA PLACEMENT Left 08/09/2016   Procedure: BRACHIOCEPHALIC ARTERIOVENOUS (AV) FISTULA CREATION LEFT ARM;  Surgeon: [name], MD;  Location: [facility];  Service: Vascular;  Laterality: Left;   CAPD INSERTION N/A 10/07/2018   Procedure: LAPAROSCOPIC PERITONEAL CATHETER PLACEMENT;  Surgeon: Mickeal Skinner, MD;  Location: [facility];  Service: General;  Laterality: N/A;   CATARACT EXTRACTION W/PHACO Left 03/28/2016   Procedure: CATARACT EXTRACTION PHACO AND INTRAOCULAR LENS PLACEMENT LEFT EYE;  Surgeon: [name], MD;  Location: [facility];  Service: Ophthalmology;  Laterality: Left;  CDE: 4.77   CATARACT EXTRACTION W/PHACO Right 04/11/2016   Procedure: CATARACT EXTRACTION PHACO AND INTRAOCULAR LENS PLACEMENT RIGHT EYE; CDE:  4.74;  Surgeon: [name], MD;  Location: [facility];  Service: Ophthalmology;  Laterality: Right;   COLONOSCOPY  10/2008   Fields: Rectal polyp obliterated, not retrieved, hemorrhoids, single ascending colon diverticulum near the CV. Next colonoscopy April 2020   COLONOSCOPY N/A 12/25/2014   SLF: 1. Colorectal polyps (2) removed 2. Small internal hemorrhoids 3. the left colon is severely redundant   DOPPLER ECHOCARDIOGRAPHY      ESOPHAGOGASTRODUODENOSCOPY N/A 12/25/2014   SLF: 1. Anemia most likely due to CRI, gastritis, gastric polyps 2. Moderate non-erosive gastriits and mild duodenitis.  3.TWo large gstric polyps removed.    EYE SURGERY  12/22/2010   tear duct probing-[place]   FOREIGN BODY REMOVAL  03/29/2011   Procedure: REMOVAL FOREIGN BODY EXTREMITY;  Surgeon: Arther Abbott, MD;  Location: [facility];  Service: Orthopedics;  Laterality: Right;  Removal Foreign Body Right Thumb   IR FLUORO GUIDE CV LINE RIGHT  08/06/2018   IR US GUIDE VASC ACCESS RIGHT  08/06/2018   KNEE ARTHROSCOPY  10/2007   left   KNEE ARTHROSCOPY WITH LATERAL MENISECTOMY Right 10/14/2015   Procedure: LEFT KNEE ARTHROSCOPY WITH PARTIAL LATERAL MENISECTOMY;  Surgeon: Carole Civil, MD;  Location: [facility];  Service: Orthopedics;  Laterality: Right;   LEFT HEART CATHETERIZATION WITH CORONARY ANGIOGRAM N/A 03/28/2013   Procedure: LEFT HEART CATHETERIZATION WITH CORONARY ANGIOGRAM;  Surgeon: Leonie Man, MD;  Location: [facility] CATH LAB;  Service: Cardiovascular;  Laterality: N/A;   NM MYOVIEW LTD     PENILE PROSTHESIS IMPLANT N/A 08/16/2015   Procedure: PENILE PROTHESIS INFLATABLE, three piece, Excisional biopsy of Penile ulcer, Penile molding;  Surgeon: Carolan Clines, MD;  Location[facility];  Service: Urology;  Laterality: N/A;   PENILE PROSTHESIS IMPLANT N/A 12/24/2017   Procedure: REMOVAL AND  REPLACEMENT  COLOPLAST PENILE PROSTHESIS;  Surgeon: Lucas Mallow, MD;  Location: [facility];  Service: Urology;  Laterality: N/A;   QUADRICEPS TENDON REPAIR  07/21/2011   Procedure: REPAIR QUADRICEP TENDON;  Surgeon: Arther Abbott, MD;  Location: [facility];  Service: Orthopedics;  Laterality: Right;   TOENAIL EXCISION     removed [id number]   UMBILICAL HERNIA REPAIR  2007   [place]    FAMHx:  Family History  Problem Relation Age of Onset   Hypertension Mother        MI   Cancer Mother        breast    Diabetes Mother    Diabetes Father     Hypertension Father    Hypertension Sister    Diabetes Sister    Arthritis Other    Asthma Other    Lung disease Other    Anesthesia problems Neg Hx    Hypotension  Neg Hx    Malignant hyperthermia Neg Hx    Pseudochol deficiency Neg Hx    Colon cancer Neg Hx     SOCHx:   reports that he quit smoking about 10 years ago. His smoking use included cigarettes. He has a 25.00 pack-year smoking history. He has never used smokeless tobacco. He reports current alcohol use. He reports that he does not currently use drugs after having used the following drugs: Marijuana.  ALLERGIES:  Allergies  Allergen Reactions   Tramadol Hcl    Opana [Oxymorphone Hcl] Itching   Oxymorphone Itching   Tramadol Itching    ROS: A comprehensive review of systems was negative.  HOME MEDS: Current Outpatient Medications  Medication Sig Dispense Refill   amLODipine (NORVASC) 5 MG tablet Take 1 tablet (5 mg total) by mouth daily. 90 tablet 3   aspirin EC 81 MG tablet Take 81 mg by mouth daily. Swallow whole.     budesonide-formoterol (SYMBICORT) 160-4.5 MCG/ACT inhaler Inhale 2 puffs into the lungs 2 (two) times daily. 1 each 6   calcitRIOL (ROCALTROL) 0.25 MCG capsule Take 0.25 mcg by mouth daily.     fluticasone (FLONASE) 50 MCG/ACT nasal spray Place 2 sprays into both nostrils daily. 16 g 0   glucose blood test strip 1 each by Other route 2 (two) times daily. Use as instructed to check blood glucose twice daily 200 each 2   Insulin Pen Needle (DROPLET PEN NEEDLES) 31G X 5 MM MISC Use to inject insulin daily as directed 150 each 2   LANTUS SOLOSTAR 100 UNIT/ML Solostar Pen INJECT  30 UNITS SUBCUTANEOUSLY AT BEDTIME 30 mL 1   losartan (COZAAR) 100 MG tablet      metoprolol succinate (TOPROL-XL) 25 MG 24 hr tablet Take 25 mg by mouth daily.     mometasone (NASONEX) 50 MCG/ACT nasal spray Place 2 sprays into the nose 2 (two) times daily as needed (allergies).     pantoprazole (PROTONIX) 40 MG tablet TAKE 1  TABLET TWICE DAILY 30 MINUTES PRIOR TO MEALS 180 tablet 1   simvastatin (ZOCOR) 20 MG tablet Take 20 mg by mouth every morning.     TRADJENTA 5 MG TABS tablet TAKE 1 TABLET EVERY DAY 90 tablet 0   No current facility-administered medications for this visit.    LABS/IMAGING: No results found for this or any previous visit (from the past 48 hour(s)). No results found.  VITALS: BP 128/88 (BP Location: Right Arm)   Pulse 69   Ht 5\' 4"  (1.626 m)   Wt 235 lb 6.4 oz (106.8 kg)   SpO2 94%   BMI 40.41 kg/m   EXAM: General appearance: alert and no distress Neck: no carotid bruit and no JVD Lungs: bilateral end-expiratory wheezes Heart: regular rate and rhythm, S1, S2 normal, no murmur, click, rub or gallop, mild tenderness to palpation over the anterior chest wall Abdomen: soft, non-tender; bowel sounds normal; no masses,  no organomegaly and morbidly obse Extremities: extremities normal, atraumatic, no cyanosis or edema Pulses: 2+ and symmetric Skin: Skin color, texture, turgor normal. No rashes or lesions Neurologic: Grossly normal Psyvh: Normal  EKG: Sinus rhythm first-degree AV block at 69, nonspecific ST changes-personally reviewed  ASSESSMENT: Mild coronary artery disease by cath in September 2014, question plaque rupture-on Plavix (no stent) Insulin-dependent diabetes Dyslipidemia-controlled Hypertension Morbid obesity Erectile dysfunction - s/p implant OSA on CPAP ESRD - s/p LUE AV fistula, on PD QHS  PLAN: 1.   [name] [name] continues to do  fairly well on home peritoneal dialysis.  Blood pressure is well controlled.  Lipids are at goal.  Weight is still an issue.  He is compliant with CPAP.  He is scheduled to see pulmonary in September area for some worsening shortness of breath and was noted to be wheezy on exam today.  He had complained of some chest wall pain which is reproducible on exam.  This does not sound cardiac.  No changes to his meds and plan follow-up with me  annually or sooner as necessary.  Pixie Casino, MD, [facility], [place] Director of the [facility] Diplomate of the American Board of Clinical Lipidology Attending Cardiologist  Direct Dial: [phone]  Fax: [phone]  Website:  [email or web address] 03/01/2021, 12:01 PM

## 2021-03-02 DIAGNOSIS — D509 Iron deficiency anemia, unspecified: Secondary | ICD-10-CM | POA: Diagnosis not present

## 2021-03-02 DIAGNOSIS — N186 End stage renal disease: Secondary | ICD-10-CM | POA: Diagnosis not present

## 2021-03-02 DIAGNOSIS — Z992 Dependence on renal dialysis: Secondary | ICD-10-CM | POA: Diagnosis not present

## 2021-03-02 DIAGNOSIS — D631 Anemia in chronic kidney disease: Secondary | ICD-10-CM | POA: Diagnosis not present

## 2021-03-03 DIAGNOSIS — D631 Anemia in chronic kidney disease: Secondary | ICD-10-CM | POA: Diagnosis not present

## 2021-03-03 DIAGNOSIS — N186 End stage renal disease: Secondary | ICD-10-CM | POA: Diagnosis not present

## 2021-03-03 DIAGNOSIS — Z992 Dependence on renal dialysis: Secondary | ICD-10-CM | POA: Diagnosis not present

## 2021-03-03 DIAGNOSIS — D509 Iron deficiency anemia, unspecified: Secondary | ICD-10-CM | POA: Diagnosis not present

## 2021-03-04 DIAGNOSIS — D631 Anemia in chronic kidney disease: Secondary | ICD-10-CM | POA: Diagnosis not present

## 2021-03-04 DIAGNOSIS — N186 End stage renal disease: Secondary | ICD-10-CM | POA: Diagnosis not present

## 2021-03-04 DIAGNOSIS — Z992 Dependence on renal dialysis: Secondary | ICD-10-CM | POA: Diagnosis not present

## 2021-03-04 DIAGNOSIS — D509 Iron deficiency anemia, unspecified: Secondary | ICD-10-CM | POA: Diagnosis not present

## 2021-03-05 DIAGNOSIS — Z992 Dependence on renal dialysis: Secondary | ICD-10-CM | POA: Diagnosis not present

## 2021-03-05 DIAGNOSIS — D631 Anemia in chronic kidney disease: Secondary | ICD-10-CM | POA: Diagnosis not present

## 2021-03-05 DIAGNOSIS — N186 End stage renal disease: Secondary | ICD-10-CM | POA: Diagnosis not present

## 2021-03-05 DIAGNOSIS — D509 Iron deficiency anemia, unspecified: Secondary | ICD-10-CM | POA: Diagnosis not present

## 2021-03-06 DIAGNOSIS — D509 Iron deficiency anemia, unspecified: Secondary | ICD-10-CM | POA: Diagnosis not present

## 2021-03-06 DIAGNOSIS — N186 End stage renal disease: Secondary | ICD-10-CM | POA: Diagnosis not present

## 2021-03-06 DIAGNOSIS — D631 Anemia in chronic kidney disease: Secondary | ICD-10-CM | POA: Diagnosis not present

## 2021-03-06 DIAGNOSIS — Z992 Dependence on renal dialysis: Secondary | ICD-10-CM | POA: Diagnosis not present

## 2021-03-07 DIAGNOSIS — Z992 Dependence on renal dialysis: Secondary | ICD-10-CM | POA: Diagnosis not present

## 2021-03-07 DIAGNOSIS — D631 Anemia in chronic kidney disease: Secondary | ICD-10-CM | POA: Diagnosis not present

## 2021-03-07 DIAGNOSIS — N186 End stage renal disease: Secondary | ICD-10-CM | POA: Diagnosis not present

## 2021-03-07 DIAGNOSIS — D509 Iron deficiency anemia, unspecified: Secondary | ICD-10-CM | POA: Diagnosis not present

## 2021-03-08 DIAGNOSIS — N186 End stage renal disease: Secondary | ICD-10-CM | POA: Diagnosis not present

## 2021-03-08 DIAGNOSIS — D509 Iron deficiency anemia, unspecified: Secondary | ICD-10-CM | POA: Diagnosis not present

## 2021-03-08 DIAGNOSIS — Z992 Dependence on renal dialysis: Secondary | ICD-10-CM | POA: Diagnosis not present

## 2021-03-08 DIAGNOSIS — D631 Anemia in chronic kidney disease: Secondary | ICD-10-CM | POA: Diagnosis not present

## 2021-03-09 DIAGNOSIS — D509 Iron deficiency anemia, unspecified: Secondary | ICD-10-CM | POA: Diagnosis not present

## 2021-03-09 DIAGNOSIS — D631 Anemia in chronic kidney disease: Secondary | ICD-10-CM | POA: Diagnosis not present

## 2021-03-09 DIAGNOSIS — Z992 Dependence on renal dialysis: Secondary | ICD-10-CM | POA: Diagnosis not present

## 2021-03-09 DIAGNOSIS — N186 End stage renal disease: Secondary | ICD-10-CM | POA: Diagnosis not present

## 2021-03-10 DIAGNOSIS — Z992 Dependence on renal dialysis: Secondary | ICD-10-CM | POA: Diagnosis not present

## 2021-03-10 DIAGNOSIS — D631 Anemia in chronic kidney disease: Secondary | ICD-10-CM | POA: Diagnosis not present

## 2021-03-10 DIAGNOSIS — N186 End stage renal disease: Secondary | ICD-10-CM | POA: Diagnosis not present

## 2021-03-10 DIAGNOSIS — D509 Iron deficiency anemia, unspecified: Secondary | ICD-10-CM | POA: Diagnosis not present

## 2021-03-11 DIAGNOSIS — D509 Iron deficiency anemia, unspecified: Secondary | ICD-10-CM | POA: Diagnosis not present

## 2021-03-11 DIAGNOSIS — N186 End stage renal disease: Secondary | ICD-10-CM | POA: Diagnosis not present

## 2021-03-11 DIAGNOSIS — D631 Anemia in chronic kidney disease: Secondary | ICD-10-CM | POA: Diagnosis not present

## 2021-03-11 DIAGNOSIS — Z992 Dependence on renal dialysis: Secondary | ICD-10-CM | POA: Diagnosis not present

## 2021-03-12 DIAGNOSIS — Z992 Dependence on renal dialysis: Secondary | ICD-10-CM | POA: Diagnosis not present

## 2021-03-12 DIAGNOSIS — D631 Anemia in chronic kidney disease: Secondary | ICD-10-CM | POA: Diagnosis not present

## 2021-03-12 DIAGNOSIS — N186 End stage renal disease: Secondary | ICD-10-CM | POA: Diagnosis not present

## 2021-03-12 DIAGNOSIS — D509 Iron deficiency anemia, unspecified: Secondary | ICD-10-CM | POA: Diagnosis not present

## 2021-03-13 DIAGNOSIS — D631 Anemia in chronic kidney disease: Secondary | ICD-10-CM | POA: Diagnosis not present

## 2021-03-13 DIAGNOSIS — N186 End stage renal disease: Secondary | ICD-10-CM | POA: Diagnosis not present

## 2021-03-13 DIAGNOSIS — D509 Iron deficiency anemia, unspecified: Secondary | ICD-10-CM | POA: Diagnosis not present

## 2021-03-13 DIAGNOSIS — Z992 Dependence on renal dialysis: Secondary | ICD-10-CM | POA: Diagnosis not present

## 2021-03-14 DIAGNOSIS — D631 Anemia in chronic kidney disease: Secondary | ICD-10-CM | POA: Diagnosis not present

## 2021-03-14 DIAGNOSIS — Z992 Dependence on renal dialysis: Secondary | ICD-10-CM | POA: Diagnosis not present

## 2021-03-14 DIAGNOSIS — D509 Iron deficiency anemia, unspecified: Secondary | ICD-10-CM | POA: Diagnosis not present

## 2021-03-14 DIAGNOSIS — N186 End stage renal disease: Secondary | ICD-10-CM | POA: Diagnosis not present

## 2021-03-15 DIAGNOSIS — Z992 Dependence on renal dialysis: Secondary | ICD-10-CM | POA: Diagnosis not present

## 2021-03-15 DIAGNOSIS — D631 Anemia in chronic kidney disease: Secondary | ICD-10-CM | POA: Diagnosis not present

## 2021-03-15 DIAGNOSIS — D509 Iron deficiency anemia, unspecified: Secondary | ICD-10-CM | POA: Diagnosis not present

## 2021-03-15 DIAGNOSIS — N186 End stage renal disease: Secondary | ICD-10-CM | POA: Diagnosis not present

## 2021-03-16 DIAGNOSIS — Z992 Dependence on renal dialysis: Secondary | ICD-10-CM | POA: Diagnosis not present

## 2021-03-16 DIAGNOSIS — N186 End stage renal disease: Secondary | ICD-10-CM | POA: Diagnosis not present

## 2021-03-16 DIAGNOSIS — D631 Anemia in chronic kidney disease: Secondary | ICD-10-CM | POA: Diagnosis not present

## 2021-03-16 DIAGNOSIS — D509 Iron deficiency anemia, unspecified: Secondary | ICD-10-CM | POA: Diagnosis not present

## 2021-03-17 ENCOUNTER — Ambulatory Visit: Payer: Medicare Other | Admitting: Orthopedic Surgery

## 2021-03-17 DIAGNOSIS — D509 Iron deficiency anemia, unspecified: Secondary | ICD-10-CM | POA: Diagnosis not present

## 2021-03-17 DIAGNOSIS — Z992 Dependence on renal dialysis: Secondary | ICD-10-CM | POA: Diagnosis not present

## 2021-03-17 DIAGNOSIS — D631 Anemia in chronic kidney disease: Secondary | ICD-10-CM | POA: Diagnosis not present

## 2021-03-17 DIAGNOSIS — N186 End stage renal disease: Secondary | ICD-10-CM | POA: Diagnosis not present

## 2021-03-18 DIAGNOSIS — D509 Iron deficiency anemia, unspecified: Secondary | ICD-10-CM | POA: Diagnosis not present

## 2021-03-18 DIAGNOSIS — Z992 Dependence on renal dialysis: Secondary | ICD-10-CM | POA: Diagnosis not present

## 2021-03-18 DIAGNOSIS — N186 End stage renal disease: Secondary | ICD-10-CM | POA: Diagnosis not present

## 2021-03-18 DIAGNOSIS — D631 Anemia in chronic kidney disease: Secondary | ICD-10-CM | POA: Diagnosis not present

## 2021-03-19 DIAGNOSIS — D509 Iron deficiency anemia, unspecified: Secondary | ICD-10-CM | POA: Diagnosis not present

## 2021-03-19 DIAGNOSIS — Z992 Dependence on renal dialysis: Secondary | ICD-10-CM | POA: Diagnosis not present

## 2021-03-19 DIAGNOSIS — N186 End stage renal disease: Secondary | ICD-10-CM | POA: Diagnosis not present

## 2021-03-19 DIAGNOSIS — D631 Anemia in chronic kidney disease: Secondary | ICD-10-CM | POA: Diagnosis not present

## 2021-03-20 DIAGNOSIS — N186 End stage renal disease: Secondary | ICD-10-CM | POA: Diagnosis not present

## 2021-03-20 DIAGNOSIS — D509 Iron deficiency anemia, unspecified: Secondary | ICD-10-CM | POA: Diagnosis not present

## 2021-03-20 DIAGNOSIS — D631 Anemia in chronic kidney disease: Secondary | ICD-10-CM | POA: Diagnosis not present

## 2021-03-20 DIAGNOSIS — Z992 Dependence on renal dialysis: Secondary | ICD-10-CM | POA: Diagnosis not present

## 2021-03-21 DIAGNOSIS — D631 Anemia in chronic kidney disease: Secondary | ICD-10-CM | POA: Diagnosis not present

## 2021-03-21 DIAGNOSIS — Z992 Dependence on renal dialysis: Secondary | ICD-10-CM | POA: Diagnosis not present

## 2021-03-21 DIAGNOSIS — D509 Iron deficiency anemia, unspecified: Secondary | ICD-10-CM | POA: Diagnosis not present

## 2021-03-21 DIAGNOSIS — N186 End stage renal disease: Secondary | ICD-10-CM | POA: Diagnosis not present

## 2021-03-22 DIAGNOSIS — D631 Anemia in chronic kidney disease: Secondary | ICD-10-CM | POA: Diagnosis not present

## 2021-03-22 DIAGNOSIS — N186 End stage renal disease: Secondary | ICD-10-CM | POA: Diagnosis not present

## 2021-03-22 DIAGNOSIS — D509 Iron deficiency anemia, unspecified: Secondary | ICD-10-CM | POA: Diagnosis not present

## 2021-03-22 DIAGNOSIS — Z992 Dependence on renal dialysis: Secondary | ICD-10-CM | POA: Diagnosis not present

## 2021-03-23 DIAGNOSIS — D509 Iron deficiency anemia, unspecified: Secondary | ICD-10-CM | POA: Diagnosis not present

## 2021-03-23 DIAGNOSIS — N186 End stage renal disease: Secondary | ICD-10-CM | POA: Diagnosis not present

## 2021-03-23 DIAGNOSIS — D631 Anemia in chronic kidney disease: Secondary | ICD-10-CM | POA: Diagnosis not present

## 2021-03-23 DIAGNOSIS — Z992 Dependence on renal dialysis: Secondary | ICD-10-CM | POA: Diagnosis not present

## 2021-03-24 DIAGNOSIS — D509 Iron deficiency anemia, unspecified: Secondary | ICD-10-CM | POA: Diagnosis not present

## 2021-03-24 DIAGNOSIS — N186 End stage renal disease: Secondary | ICD-10-CM | POA: Diagnosis not present

## 2021-03-24 DIAGNOSIS — Z992 Dependence on renal dialysis: Secondary | ICD-10-CM | POA: Diagnosis not present

## 2021-03-24 DIAGNOSIS — D631 Anemia in chronic kidney disease: Secondary | ICD-10-CM | POA: Diagnosis not present

## 2021-03-25 ENCOUNTER — Other Ambulatory Visit: Payer: Self-pay

## 2021-03-25 ENCOUNTER — Encounter: Payer: Self-pay | Admitting: Pulmonary Disease

## 2021-03-25 ENCOUNTER — Ambulatory Visit (INDEPENDENT_AMBULATORY_CARE_PROVIDER_SITE_OTHER): Payer: Medicare Other | Admitting: Pulmonary Disease

## 2021-03-25 DIAGNOSIS — I2510 Atherosclerotic heart disease of native coronary artery without angina pectoris: Secondary | ICD-10-CM | POA: Diagnosis not present

## 2021-03-25 DIAGNOSIS — Z9989 Dependence on other enabling machines and devices: Secondary | ICD-10-CM | POA: Diagnosis not present

## 2021-03-25 DIAGNOSIS — J4541 Moderate persistent asthma with (acute) exacerbation: Secondary | ICD-10-CM

## 2021-03-25 DIAGNOSIS — G4733 Obstructive sleep apnea (adult) (pediatric): Secondary | ICD-10-CM | POA: Diagnosis not present

## 2021-03-25 DIAGNOSIS — N186 End stage renal disease: Secondary | ICD-10-CM | POA: Diagnosis not present

## 2021-03-25 DIAGNOSIS — D509 Iron deficiency anemia, unspecified: Secondary | ICD-10-CM | POA: Diagnosis not present

## 2021-03-25 DIAGNOSIS — Z992 Dependence on renal dialysis: Secondary | ICD-10-CM | POA: Diagnosis not present

## 2021-03-25 DIAGNOSIS — D631 Anemia in chronic kidney disease: Secondary | ICD-10-CM | POA: Diagnosis not present

## 2021-03-25 MED ORDER — PREDNISONE 10 MG PO TABS: ORAL_TABLET | ORAL | 0 refills | Status: AC

## 2021-03-25 MED ORDER — ALBUTEROL SULFATE HFA 108 (90 BASE) MCG/ACT IN AERS: 2.0000 | INHALATION_SPRAY | Freq: Four times a day (QID) | RESPIRATORY_TRACT | 11 refills | Status: DC | PRN

## 2021-03-25 NOTE — Assessment & Plan Note (Signed)
Prescription for auto CPAP has been sent but he has not received this yet he states that he has checked with DME and it seems to be on backorder.  We will also check with DME Meanwhile I have asked him to obtain new nasal pillows and start using his old CPAP  Weight loss encouraged, compliance with goal of at least 4-6 hrs every night is the expectation. Advised against medications with sedative side effects Cautioned against driving when sleepy - understanding that sleepiness will vary on a day to day basis

## 2021-03-25 NOTE — Patient Instructions (Signed)
Prednisone 10 mg tabs Take 4 tabs  daily with food x 4 days, then 3 tabs daily x 4 days, then 2 tabs daily x 4 days, then 1 tab daily x4 days then stop. #40  Refills on symbicort Rx for Albuterol MDI 2 puffs every 6h as needed  We will check with DME about your CPAP Rx

## 2021-03-25 NOTE — Progress Notes (Signed)
Subjective:    Patient ID: [name] [name], male    DOB: 23-Aug-1950, 70 y.o.   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 Lamonte Sakai, last seen on 03/18/20.  He carries a diagnosis of asthma, following exposure to [facility] dust on 9/11 maintained on Symbicort, albuterol.  He smoked about 25 pack years before he quit in 2011 started on oxygen with exertion 10 years ago, wears as needed  He presents to establish care today at the [facility] office for OSA and moderate persistent asthma. Reviewed his last office visit 05/2020, his CPAP machine was more than 71 years old, home sleep test was ordered which showed moderate OSA  Prescription for auto CPAP was sent but he has not received this yet, he states that DME/[facility] has told him that it is on backorder.  He had to stop using his old machine since he had burning with the nasal pillows.  He also reports increased wheezing and chest tightness with a dry cough for the past 2 to 3 months, no specific triggers identified.  He states that Dr. Georgiann Cocker would always give him a steroid shot.  He is on insulin for diabetes    Significant tests/ events reviewed  08/2020 HST >> AHI 21/h  PFTs 12/2019 ratio 74 FEV1 of 3 6 L (57% predicted), normal (pseudonormal) lung volumes, normal diffusion capacity.   Past Medical History:  Diagnosis Date   Anemia    Arthritis    Asthma    Bell palsy    CAD (coronary artery disease)    a. 2014 MV: abnl w/ infap ischemia; b. 03/2013 Cath: aneurysmal bleb in the LAD w/ otw nonobs dzs-->Med Rx.   Chronic back pain    Chronic knee pain    a. 09/2015 s/p R TKA.   Chronic pain    Chronic shoulder pain    Chronic sinusitis    CKD (chronic kidney disease), stage IV (HCC)    stage 4 per office visit note of Dr Lowanda Foster on  05/2015    COPD (chronic obstructive pulmonary disease) ([place])    Diabetes mellitus without complication ([place])    type II    Essential hypertension    GERD (gastroesophageal reflux disease)    Gout    Gout    Hepatomegaly    noted on noncontrast CT 2015   History of hiatal hernia    Hyperlipidemia    Lateral meniscus tear    Obesity    Truncal   Obstructive sleep apnea    does not use cpap    On home oxygen therapy    uses 2l when is going somewhere per patient    PUD (peptic ulcer disease)    remote, reports f/u EGD about 8 years ago unremarkable    Reactive airway disease    related to exposure to chemical during 9/11   Renal insufficiency    Sinusitis    Vitamin D deficiency      Review of Systems neg for any significant sore throat, dysphagia, itching, sneezing, nasal congestion or excess/ purulent secretions, fever, chills, sweats, unintended wt loss, pleuritic or exertional cp, hempoptysis, orthopnea pnd or change in chronic leg swelling. Also denies presyncope, palpitations, heartburn, abdominal pain, nausea, vomiting, diarrhea or change in bowel or urinary habits,  dysuria,hematuria, rash, arthralgias, visual complaints, headache, numbness weakness or ataxia.     Objective:   Physical Exam   Gen. Pleasant, obese, in no distress ENT - no lesions, no post nasal drip Neck: No JVD, no thyromegaly, no carotid bruits Lungs: no use of accessory muscles, no dullness to percussion, decreased without rales or rhonchi  Cardiovascular: Rhythm regular, heart sounds  normal, no murmurs or gallops, no peripheral edema Musculoskeletal: No deformities, no cyanosis or clubbing , no tremors Abd - no tenderness, PD cath       Assessment & Plan:

## 2021-03-25 NOTE — Assessment & Plan Note (Signed)
We will treat him as an asthma exacerbation with a 2-week course of prednisone starting at 40 mg.  Explained to him about hyperglycemia and he will self administer insulin if CBGs are high. Refills will be provided on Symbicort. We will also provide him prescription for rescue albuterol inhaler to use on an as-needed basis

## 2021-03-26 DIAGNOSIS — Z992 Dependence on renal dialysis: Secondary | ICD-10-CM | POA: Diagnosis not present

## 2021-03-26 DIAGNOSIS — D631 Anemia in chronic kidney disease: Secondary | ICD-10-CM | POA: Diagnosis not present

## 2021-03-26 DIAGNOSIS — D509 Iron deficiency anemia, unspecified: Secondary | ICD-10-CM | POA: Diagnosis not present

## 2021-03-26 DIAGNOSIS — N186 End stage renal disease: Secondary | ICD-10-CM | POA: Diagnosis not present

## 2021-03-27 DIAGNOSIS — Z992 Dependence on renal dialysis: Secondary | ICD-10-CM | POA: Diagnosis not present

## 2021-03-27 DIAGNOSIS — D631 Anemia in chronic kidney disease: Secondary | ICD-10-CM | POA: Diagnosis not present

## 2021-03-27 DIAGNOSIS — N186 End stage renal disease: Secondary | ICD-10-CM | POA: Diagnosis not present

## 2021-03-27 DIAGNOSIS — D509 Iron deficiency anemia, unspecified: Secondary | ICD-10-CM | POA: Diagnosis not present

## 2021-03-28 DIAGNOSIS — D631 Anemia in chronic kidney disease: Secondary | ICD-10-CM | POA: Diagnosis not present

## 2021-03-28 DIAGNOSIS — D509 Iron deficiency anemia, unspecified: Secondary | ICD-10-CM | POA: Diagnosis not present

## 2021-03-28 DIAGNOSIS — Z992 Dependence on renal dialysis: Secondary | ICD-10-CM | POA: Diagnosis not present

## 2021-03-28 DIAGNOSIS — N186 End stage renal disease: Secondary | ICD-10-CM | POA: Diagnosis not present

## 2021-03-29 DIAGNOSIS — N186 End stage renal disease: Secondary | ICD-10-CM | POA: Diagnosis not present

## 2021-03-29 DIAGNOSIS — Z992 Dependence on renal dialysis: Secondary | ICD-10-CM | POA: Diagnosis not present

## 2021-03-29 DIAGNOSIS — D509 Iron deficiency anemia, unspecified: Secondary | ICD-10-CM | POA: Diagnosis not present

## 2021-03-29 DIAGNOSIS — D631 Anemia in chronic kidney disease: Secondary | ICD-10-CM | POA: Diagnosis not present

## 2021-03-30 DIAGNOSIS — Z992 Dependence on renal dialysis: Secondary | ICD-10-CM | POA: Diagnosis not present

## 2021-03-30 DIAGNOSIS — N186 End stage renal disease: Secondary | ICD-10-CM | POA: Diagnosis not present

## 2021-03-30 DIAGNOSIS — D509 Iron deficiency anemia, unspecified: Secondary | ICD-10-CM | POA: Diagnosis not present

## 2021-03-30 DIAGNOSIS — D631 Anemia in chronic kidney disease: Secondary | ICD-10-CM | POA: Diagnosis not present

## 2021-03-31 ENCOUNTER — Ambulatory Visit: Payer: Medicare Other | Admitting: Orthopedic Surgery

## 2021-03-31 DIAGNOSIS — D509 Iron deficiency anemia, unspecified: Secondary | ICD-10-CM | POA: Diagnosis not present

## 2021-03-31 DIAGNOSIS — D631 Anemia in chronic kidney disease: Secondary | ICD-10-CM | POA: Diagnosis not present

## 2021-03-31 DIAGNOSIS — N186 End stage renal disease: Secondary | ICD-10-CM | POA: Diagnosis not present

## 2021-03-31 DIAGNOSIS — Z992 Dependence on renal dialysis: Secondary | ICD-10-CM | POA: Diagnosis not present

## 2021-04-01 DIAGNOSIS — D631 Anemia in chronic kidney disease: Secondary | ICD-10-CM | POA: Diagnosis not present

## 2021-04-01 DIAGNOSIS — Z992 Dependence on renal dialysis: Secondary | ICD-10-CM | POA: Diagnosis not present

## 2021-04-01 DIAGNOSIS — N186 End stage renal disease: Secondary | ICD-10-CM | POA: Diagnosis not present

## 2021-04-01 DIAGNOSIS — D509 Iron deficiency anemia, unspecified: Secondary | ICD-10-CM | POA: Diagnosis not present

## 2021-04-02 DIAGNOSIS — D509 Iron deficiency anemia, unspecified: Secondary | ICD-10-CM | POA: Diagnosis not present

## 2021-04-02 DIAGNOSIS — Z992 Dependence on renal dialysis: Secondary | ICD-10-CM | POA: Diagnosis not present

## 2021-04-02 DIAGNOSIS — N186 End stage renal disease: Secondary | ICD-10-CM | POA: Diagnosis not present

## 2021-04-02 DIAGNOSIS — D631 Anemia in chronic kidney disease: Secondary | ICD-10-CM | POA: Diagnosis not present

## 2021-04-03 DIAGNOSIS — D509 Iron deficiency anemia, unspecified: Secondary | ICD-10-CM | POA: Diagnosis not present

## 2021-04-03 DIAGNOSIS — Z992 Dependence on renal dialysis: Secondary | ICD-10-CM | POA: Diagnosis not present

## 2021-04-03 DIAGNOSIS — D631 Anemia in chronic kidney disease: Secondary | ICD-10-CM | POA: Diagnosis not present

## 2021-04-03 DIAGNOSIS — N186 End stage renal disease: Secondary | ICD-10-CM | POA: Diagnosis not present

## 2021-04-04 DIAGNOSIS — Z992 Dependence on renal dialysis: Secondary | ICD-10-CM | POA: Diagnosis not present

## 2021-04-04 DIAGNOSIS — D509 Iron deficiency anemia, unspecified: Secondary | ICD-10-CM | POA: Diagnosis not present

## 2021-04-04 DIAGNOSIS — D631 Anemia in chronic kidney disease: Secondary | ICD-10-CM | POA: Diagnosis not present

## 2021-04-04 DIAGNOSIS — N186 End stage renal disease: Secondary | ICD-10-CM | POA: Diagnosis not present

## 2021-04-05 DIAGNOSIS — E1165 Type 2 diabetes mellitus with hyperglycemia: Secondary | ICD-10-CM | POA: Diagnosis not present

## 2021-04-05 DIAGNOSIS — Z1331 Encounter for screening for depression: Secondary | ICD-10-CM | POA: Diagnosis not present

## 2021-04-05 DIAGNOSIS — D509 Iron deficiency anemia, unspecified: Secondary | ICD-10-CM | POA: Diagnosis not present

## 2021-04-05 DIAGNOSIS — Z992 Dependence on renal dialysis: Secondary | ICD-10-CM | POA: Diagnosis not present

## 2021-04-05 DIAGNOSIS — N186 End stage renal disease: Secondary | ICD-10-CM | POA: Diagnosis not present

## 2021-04-05 DIAGNOSIS — D631 Anemia in chronic kidney disease: Secondary | ICD-10-CM | POA: Diagnosis not present

## 2021-04-05 DIAGNOSIS — Z1389 Encounter for screening for other disorder: Secondary | ICD-10-CM | POA: Diagnosis not present

## 2021-04-05 DIAGNOSIS — I10 Essential (primary) hypertension: Secondary | ICD-10-CM | POA: Diagnosis not present

## 2021-04-06 DIAGNOSIS — D509 Iron deficiency anemia, unspecified: Secondary | ICD-10-CM | POA: Diagnosis not present

## 2021-04-06 DIAGNOSIS — D631 Anemia in chronic kidney disease: Secondary | ICD-10-CM | POA: Diagnosis not present

## 2021-04-06 DIAGNOSIS — N186 End stage renal disease: Secondary | ICD-10-CM | POA: Diagnosis not present

## 2021-04-06 DIAGNOSIS — Z992 Dependence on renal dialysis: Secondary | ICD-10-CM | POA: Diagnosis not present

## 2021-04-07 DIAGNOSIS — D509 Iron deficiency anemia, unspecified: Secondary | ICD-10-CM | POA: Diagnosis not present

## 2021-04-07 DIAGNOSIS — Z992 Dependence on renal dialysis: Secondary | ICD-10-CM | POA: Diagnosis not present

## 2021-04-07 DIAGNOSIS — N186 End stage renal disease: Secondary | ICD-10-CM | POA: Diagnosis not present

## 2021-04-07 DIAGNOSIS — D631 Anemia in chronic kidney disease: Secondary | ICD-10-CM | POA: Diagnosis not present

## 2021-04-08 DIAGNOSIS — D631 Anemia in chronic kidney disease: Secondary | ICD-10-CM | POA: Diagnosis not present

## 2021-04-08 DIAGNOSIS — D509 Iron deficiency anemia, unspecified: Secondary | ICD-10-CM | POA: Diagnosis not present

## 2021-04-08 DIAGNOSIS — Z992 Dependence on renal dialysis: Secondary | ICD-10-CM | POA: Diagnosis not present

## 2021-04-08 DIAGNOSIS — N186 End stage renal disease: Secondary | ICD-10-CM | POA: Diagnosis not present

## 2021-04-09 DIAGNOSIS — D509 Iron deficiency anemia, unspecified: Secondary | ICD-10-CM | POA: Diagnosis not present

## 2021-04-09 DIAGNOSIS — Z992 Dependence on renal dialysis: Secondary | ICD-10-CM | POA: Diagnosis not present

## 2021-04-09 DIAGNOSIS — D631 Anemia in chronic kidney disease: Secondary | ICD-10-CM | POA: Diagnosis not present

## 2021-04-09 DIAGNOSIS — N186 End stage renal disease: Secondary | ICD-10-CM | POA: Diagnosis not present

## 2021-04-10 DIAGNOSIS — D509 Iron deficiency anemia, unspecified: Secondary | ICD-10-CM | POA: Diagnosis not present

## 2021-04-10 DIAGNOSIS — D631 Anemia in chronic kidney disease: Secondary | ICD-10-CM | POA: Diagnosis not present

## 2021-04-10 DIAGNOSIS — Z992 Dependence on renal dialysis: Secondary | ICD-10-CM | POA: Diagnosis not present

## 2021-04-10 DIAGNOSIS — N186 End stage renal disease: Secondary | ICD-10-CM | POA: Diagnosis not present

## 2021-04-11 DIAGNOSIS — R42 Dizziness and giddiness: Secondary | ICD-10-CM | POA: Diagnosis not present

## 2021-04-11 DIAGNOSIS — E1165 Type 2 diabetes mellitus with hyperglycemia: Secondary | ICD-10-CM | POA: Diagnosis not present

## 2021-04-11 DIAGNOSIS — N186 End stage renal disease: Secondary | ICD-10-CM | POA: Diagnosis not present

## 2021-04-11 DIAGNOSIS — D509 Iron deficiency anemia, unspecified: Secondary | ICD-10-CM | POA: Diagnosis not present

## 2021-04-11 DIAGNOSIS — D631 Anemia in chronic kidney disease: Secondary | ICD-10-CM | POA: Diagnosis not present

## 2021-04-11 DIAGNOSIS — Z992 Dependence on renal dialysis: Secondary | ICD-10-CM | POA: Diagnosis not present

## 2021-04-12 DIAGNOSIS — D509 Iron deficiency anemia, unspecified: Secondary | ICD-10-CM | POA: Diagnosis not present

## 2021-04-12 DIAGNOSIS — Z992 Dependence on renal dialysis: Secondary | ICD-10-CM | POA: Diagnosis not present

## 2021-04-12 DIAGNOSIS — D631 Anemia in chronic kidney disease: Secondary | ICD-10-CM | POA: Diagnosis not present

## 2021-04-12 DIAGNOSIS — N186 End stage renal disease: Secondary | ICD-10-CM | POA: Diagnosis not present

## 2021-04-13 DIAGNOSIS — D631 Anemia in chronic kidney disease: Secondary | ICD-10-CM | POA: Diagnosis not present

## 2021-04-13 DIAGNOSIS — N186 End stage renal disease: Secondary | ICD-10-CM | POA: Diagnosis not present

## 2021-04-13 DIAGNOSIS — Z992 Dependence on renal dialysis: Secondary | ICD-10-CM | POA: Diagnosis not present

## 2021-04-13 DIAGNOSIS — D509 Iron deficiency anemia, unspecified: Secondary | ICD-10-CM | POA: Diagnosis not present

## 2021-04-14 DIAGNOSIS — D509 Iron deficiency anemia, unspecified: Secondary | ICD-10-CM | POA: Diagnosis not present

## 2021-04-14 DIAGNOSIS — D631 Anemia in chronic kidney disease: Secondary | ICD-10-CM | POA: Diagnosis not present

## 2021-04-14 DIAGNOSIS — N186 End stage renal disease: Secondary | ICD-10-CM | POA: Diagnosis not present

## 2021-04-14 DIAGNOSIS — Z992 Dependence on renal dialysis: Secondary | ICD-10-CM | POA: Diagnosis not present

## 2021-04-15 DIAGNOSIS — Z992 Dependence on renal dialysis: Secondary | ICD-10-CM | POA: Diagnosis not present

## 2021-04-15 DIAGNOSIS — D631 Anemia in chronic kidney disease: Secondary | ICD-10-CM | POA: Diagnosis not present

## 2021-04-15 DIAGNOSIS — D509 Iron deficiency anemia, unspecified: Secondary | ICD-10-CM | POA: Diagnosis not present

## 2021-04-15 DIAGNOSIS — N186 End stage renal disease: Secondary | ICD-10-CM | POA: Diagnosis not present

## 2021-04-16 DIAGNOSIS — N2581 Secondary hyperparathyroidism of renal origin: Secondary | ICD-10-CM | POA: Diagnosis not present

## 2021-04-16 DIAGNOSIS — N186 End stage renal disease: Secondary | ICD-10-CM | POA: Diagnosis not present

## 2021-04-16 DIAGNOSIS — D509 Iron deficiency anemia, unspecified: Secondary | ICD-10-CM | POA: Diagnosis not present

## 2021-04-16 DIAGNOSIS — D631 Anemia in chronic kidney disease: Secondary | ICD-10-CM | POA: Diagnosis not present

## 2021-04-16 DIAGNOSIS — Z23 Encounter for immunization: Secondary | ICD-10-CM | POA: Diagnosis not present

## 2021-04-16 DIAGNOSIS — Z992 Dependence on renal dialysis: Secondary | ICD-10-CM | POA: Diagnosis not present

## 2021-04-17 DIAGNOSIS — D631 Anemia in chronic kidney disease: Secondary | ICD-10-CM | POA: Diagnosis not present

## 2021-04-17 DIAGNOSIS — Z992 Dependence on renal dialysis: Secondary | ICD-10-CM | POA: Diagnosis not present

## 2021-04-17 DIAGNOSIS — D509 Iron deficiency anemia, unspecified: Secondary | ICD-10-CM | POA: Diagnosis not present

## 2021-04-17 DIAGNOSIS — N186 End stage renal disease: Secondary | ICD-10-CM | POA: Diagnosis not present

## 2021-04-17 DIAGNOSIS — N2581 Secondary hyperparathyroidism of renal origin: Secondary | ICD-10-CM | POA: Diagnosis not present

## 2021-04-17 DIAGNOSIS — Z23 Encounter for immunization: Secondary | ICD-10-CM | POA: Diagnosis not present

## 2021-04-18 DIAGNOSIS — N186 End stage renal disease: Secondary | ICD-10-CM | POA: Diagnosis not present

## 2021-04-18 DIAGNOSIS — Z992 Dependence on renal dialysis: Secondary | ICD-10-CM | POA: Diagnosis not present

## 2021-04-18 DIAGNOSIS — N2581 Secondary hyperparathyroidism of renal origin: Secondary | ICD-10-CM | POA: Diagnosis not present

## 2021-04-18 DIAGNOSIS — D509 Iron deficiency anemia, unspecified: Secondary | ICD-10-CM | POA: Diagnosis not present

## 2021-04-18 DIAGNOSIS — D631 Anemia in chronic kidney disease: Secondary | ICD-10-CM | POA: Diagnosis not present

## 2021-04-18 DIAGNOSIS — Z23 Encounter for immunization: Secondary | ICD-10-CM | POA: Diagnosis not present

## 2021-04-19 DIAGNOSIS — Z23 Encounter for immunization: Secondary | ICD-10-CM | POA: Diagnosis not present

## 2021-04-19 DIAGNOSIS — Z992 Dependence on renal dialysis: Secondary | ICD-10-CM | POA: Diagnosis not present

## 2021-04-19 DIAGNOSIS — N2581 Secondary hyperparathyroidism of renal origin: Secondary | ICD-10-CM | POA: Diagnosis not present

## 2021-04-19 DIAGNOSIS — D631 Anemia in chronic kidney disease: Secondary | ICD-10-CM | POA: Diagnosis not present

## 2021-04-19 DIAGNOSIS — N186 End stage renal disease: Secondary | ICD-10-CM | POA: Diagnosis not present

## 2021-04-19 DIAGNOSIS — D509 Iron deficiency anemia, unspecified: Secondary | ICD-10-CM | POA: Diagnosis not present

## 2021-04-20 DIAGNOSIS — Z992 Dependence on renal dialysis: Secondary | ICD-10-CM | POA: Diagnosis not present

## 2021-04-20 DIAGNOSIS — B9689 Other specified bacterial agents as the cause of diseases classified elsewhere: Secondary | ICD-10-CM | POA: Diagnosis not present

## 2021-04-20 DIAGNOSIS — D631 Anemia in chronic kidney disease: Secondary | ICD-10-CM | POA: Diagnosis not present

## 2021-04-20 DIAGNOSIS — L905 Scar conditions and fibrosis of skin: Secondary | ICD-10-CM | POA: Diagnosis not present

## 2021-04-20 DIAGNOSIS — D509 Iron deficiency anemia, unspecified: Secondary | ICD-10-CM | POA: Diagnosis not present

## 2021-04-20 DIAGNOSIS — N186 End stage renal disease: Secondary | ICD-10-CM | POA: Diagnosis not present

## 2021-04-20 DIAGNOSIS — L02821 Furuncle of head [any part, except face]: Secondary | ICD-10-CM | POA: Diagnosis not present

## 2021-04-20 DIAGNOSIS — N2581 Secondary hyperparathyroidism of renal origin: Secondary | ICD-10-CM | POA: Diagnosis not present

## 2021-04-20 DIAGNOSIS — Z23 Encounter for immunization: Secondary | ICD-10-CM | POA: Diagnosis not present

## 2021-04-20 DIAGNOSIS — B078 Other viral warts: Secondary | ICD-10-CM | POA: Diagnosis not present

## 2021-04-21 DIAGNOSIS — Z23 Encounter for immunization: Secondary | ICD-10-CM | POA: Diagnosis not present

## 2021-04-21 DIAGNOSIS — Z992 Dependence on renal dialysis: Secondary | ICD-10-CM | POA: Diagnosis not present

## 2021-04-21 DIAGNOSIS — N2581 Secondary hyperparathyroidism of renal origin: Secondary | ICD-10-CM | POA: Diagnosis not present

## 2021-04-21 DIAGNOSIS — D509 Iron deficiency anemia, unspecified: Secondary | ICD-10-CM | POA: Diagnosis not present

## 2021-04-21 DIAGNOSIS — D631 Anemia in chronic kidney disease: Secondary | ICD-10-CM | POA: Diagnosis not present

## 2021-04-21 DIAGNOSIS — N186 End stage renal disease: Secondary | ICD-10-CM | POA: Diagnosis not present

## 2021-04-22 DIAGNOSIS — Z794 Long term (current) use of insulin: Secondary | ICD-10-CM | POA: Diagnosis not present

## 2021-04-22 DIAGNOSIS — Z23 Encounter for immunization: Secondary | ICD-10-CM | POA: Diagnosis not present

## 2021-04-22 DIAGNOSIS — N2581 Secondary hyperparathyroidism of renal origin: Secondary | ICD-10-CM | POA: Diagnosis not present

## 2021-04-22 DIAGNOSIS — N186 End stage renal disease: Secondary | ICD-10-CM | POA: Diagnosis not present

## 2021-04-22 DIAGNOSIS — E119 Type 2 diabetes mellitus without complications: Secondary | ICD-10-CM | POA: Diagnosis not present

## 2021-04-22 DIAGNOSIS — D631 Anemia in chronic kidney disease: Secondary | ICD-10-CM | POA: Diagnosis not present

## 2021-04-22 DIAGNOSIS — Z992 Dependence on renal dialysis: Secondary | ICD-10-CM | POA: Diagnosis not present

## 2021-04-22 DIAGNOSIS — D509 Iron deficiency anemia, unspecified: Secondary | ICD-10-CM | POA: Diagnosis not present

## 2021-04-23 DIAGNOSIS — Z992 Dependence on renal dialysis: Secondary | ICD-10-CM | POA: Diagnosis not present

## 2021-04-23 DIAGNOSIS — N186 End stage renal disease: Secondary | ICD-10-CM | POA: Diagnosis not present

## 2021-04-23 DIAGNOSIS — N2581 Secondary hyperparathyroidism of renal origin: Secondary | ICD-10-CM | POA: Diagnosis not present

## 2021-04-23 DIAGNOSIS — D509 Iron deficiency anemia, unspecified: Secondary | ICD-10-CM | POA: Diagnosis not present

## 2021-04-23 DIAGNOSIS — D631 Anemia in chronic kidney disease: Secondary | ICD-10-CM | POA: Diagnosis not present

## 2021-04-23 DIAGNOSIS — Z23 Encounter for immunization: Secondary | ICD-10-CM | POA: Diagnosis not present

## 2021-04-24 DIAGNOSIS — N186 End stage renal disease: Secondary | ICD-10-CM | POA: Diagnosis not present

## 2021-04-24 DIAGNOSIS — D631 Anemia in chronic kidney disease: Secondary | ICD-10-CM | POA: Diagnosis not present

## 2021-04-24 DIAGNOSIS — D509 Iron deficiency anemia, unspecified: Secondary | ICD-10-CM | POA: Diagnosis not present

## 2021-04-24 DIAGNOSIS — Z23 Encounter for immunization: Secondary | ICD-10-CM | POA: Diagnosis not present

## 2021-04-24 DIAGNOSIS — N2581 Secondary hyperparathyroidism of renal origin: Secondary | ICD-10-CM | POA: Diagnosis not present

## 2021-04-24 DIAGNOSIS — Z992 Dependence on renal dialysis: Secondary | ICD-10-CM | POA: Diagnosis not present

## 2021-04-25 ENCOUNTER — Ambulatory Visit (INDEPENDENT_AMBULATORY_CARE_PROVIDER_SITE_OTHER): Payer: Medicare Other | Admitting: Orthopedic Surgery

## 2021-04-25 ENCOUNTER — Other Ambulatory Visit: Payer: Self-pay | Admitting: Radiology

## 2021-04-25 ENCOUNTER — Encounter: Payer: Self-pay | Admitting: Orthopedic Surgery

## 2021-04-25 ENCOUNTER — Other Ambulatory Visit: Payer: Self-pay

## 2021-04-25 VITALS — Ht 61.0 in | Wt 235.0 lb

## 2021-04-25 DIAGNOSIS — Z23 Encounter for immunization: Secondary | ICD-10-CM | POA: Diagnosis not present

## 2021-04-25 DIAGNOSIS — M545 Low back pain, unspecified: Secondary | ICD-10-CM | POA: Diagnosis not present

## 2021-04-25 DIAGNOSIS — N2581 Secondary hyperparathyroidism of renal origin: Secondary | ICD-10-CM | POA: Diagnosis not present

## 2021-04-25 DIAGNOSIS — D509 Iron deficiency anemia, unspecified: Secondary | ICD-10-CM | POA: Diagnosis not present

## 2021-04-25 DIAGNOSIS — N186 End stage renal disease: Secondary | ICD-10-CM | POA: Diagnosis not present

## 2021-04-25 DIAGNOSIS — D631 Anemia in chronic kidney disease: Secondary | ICD-10-CM | POA: Diagnosis not present

## 2021-04-25 DIAGNOSIS — I2510 Atherosclerotic heart disease of native coronary artery without angina pectoris: Secondary | ICD-10-CM

## 2021-04-25 DIAGNOSIS — Z992 Dependence on renal dialysis: Secondary | ICD-10-CM | POA: Diagnosis not present

## 2021-04-25 MED ORDER — DICLOFENAC SODIUM 50 MG PO TBEC: 50.0000 mg | DELAYED_RELEASE_TABLET | Freq: Two times a day (BID) | ORAL | 2 refills | Status: DC

## 2021-04-25 MED ORDER — DICLOFENAC POTASSIUM 50 MG PO TABS
50.0000 mg | ORAL_TABLET | Freq: Two times a day (BID) | ORAL | 3 refills | Status: DC
Start: 2021-04-25 — End: 2021-07-27

## 2021-04-25 MED ORDER — HYDROCODONE-ACETAMINOPHEN 5-325 MG PO TABS: 1.0000 | ORAL_TABLET | Freq: Four times a day (QID) | ORAL | 0 refills | Status: DC | PRN

## 2021-04-25 MED ORDER — TIZANIDINE HCL 4 MG PO TABS: 4.0000 mg | ORAL_TABLET | Freq: Four times a day (QID) | ORAL | 2 refills | Status: DC | PRN

## 2021-04-25 NOTE — Telephone Encounter (Signed)
Preferred drugs are Meloxicam Ibuprofen Naproxen Diclofenac soduim Please advise, ( I pended Diclofenac Sodium)

## 2021-04-25 NOTE — Progress Notes (Signed)
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 his house and has been carrying a lot of wood and picking up a lot of heavy objects  He did try some Tylenol did not improve  He does not take any blood thinners  Exam shows tenderness on the right and left sides of the lower back he has tenderness also on the central L4-5 region  His lower extremities have normal strength and muscle tone reflexes are intact equal and normal no sensory deficits no weakness  X-ray that was done in January shows osteoarthritis of the lumbar spine in the lower segments  No recent films of been taken but there was no trauma  At this point and when I recommend he rest stop heavy lifting  Start the following medications  Expect resolution in 2 weeks if no improvement then imaging can be performed  Meds ordered this encounter  Medications   HYDROcodone-acetaminophen (NORCO/VICODIN) 5-325 MG tablet    Sig: Take 1 tablet by mouth every 6 (six) hours as needed for moderate pain.    Dispense:  30 tablet    Refill:  0   tiZANidine (ZANAFLEX) 4 MG tablet    Sig: Take 1 tablet (4 mg total) by mouth every 6 (six) hours as needed for up to 60 doses for muscle spasms.    Dispense:  60 tablet    Refill:  2   diclofenac (CATAFLAM) 50 MG tablet    Sig: Take 1 tablet (50 mg total) by mouth 2 (two) times daily.    Dispense:  60 tablet    Refill:  3    Note personal interpretation of the image taken January 2022 5 views of the lumbar spine there is no fracture dislocation or subluxation there is no evidence of spondylolysis or listhesis there is spondylosis primarily in the facet joints.  The disc spaces are wide open.

## 2021-04-25 NOTE — Patient Instructions (Signed)
TAKE IT EASY UNTIL PAIN SUBSIDES   TAKE MEDICATIONS AS ORDERED

## 2021-04-26 DIAGNOSIS — D509 Iron deficiency anemia, unspecified: Secondary | ICD-10-CM | POA: Diagnosis not present

## 2021-04-26 DIAGNOSIS — E1142 Type 2 diabetes mellitus with diabetic polyneuropathy: Secondary | ICD-10-CM | POA: Diagnosis not present

## 2021-04-26 DIAGNOSIS — D631 Anemia in chronic kidney disease: Secondary | ICD-10-CM | POA: Diagnosis not present

## 2021-04-26 DIAGNOSIS — N2581 Secondary hyperparathyroidism of renal origin: Secondary | ICD-10-CM | POA: Diagnosis not present

## 2021-04-26 DIAGNOSIS — N186 End stage renal disease: Secondary | ICD-10-CM | POA: Diagnosis not present

## 2021-04-26 DIAGNOSIS — B351 Tinea unguium: Secondary | ICD-10-CM | POA: Diagnosis not present

## 2021-04-26 DIAGNOSIS — Z23 Encounter for immunization: Secondary | ICD-10-CM | POA: Diagnosis not present

## 2021-04-26 DIAGNOSIS — Z992 Dependence on renal dialysis: Secondary | ICD-10-CM | POA: Diagnosis not present

## 2021-04-27 DIAGNOSIS — D631 Anemia in chronic kidney disease: Secondary | ICD-10-CM | POA: Diagnosis not present

## 2021-04-27 DIAGNOSIS — Z992 Dependence on renal dialysis: Secondary | ICD-10-CM | POA: Diagnosis not present

## 2021-04-27 DIAGNOSIS — Z23 Encounter for immunization: Secondary | ICD-10-CM | POA: Diagnosis not present

## 2021-04-27 DIAGNOSIS — N2581 Secondary hyperparathyroidism of renal origin: Secondary | ICD-10-CM | POA: Diagnosis not present

## 2021-04-27 DIAGNOSIS — N186 End stage renal disease: Secondary | ICD-10-CM | POA: Diagnosis not present

## 2021-04-27 DIAGNOSIS — D509 Iron deficiency anemia, unspecified: Secondary | ICD-10-CM | POA: Diagnosis not present

## 2021-04-28 DIAGNOSIS — N2581 Secondary hyperparathyroidism of renal origin: Secondary | ICD-10-CM | POA: Diagnosis not present

## 2021-04-28 DIAGNOSIS — N186 End stage renal disease: Secondary | ICD-10-CM | POA: Diagnosis not present

## 2021-04-28 DIAGNOSIS — D509 Iron deficiency anemia, unspecified: Secondary | ICD-10-CM | POA: Diagnosis not present

## 2021-04-28 DIAGNOSIS — D631 Anemia in chronic kidney disease: Secondary | ICD-10-CM | POA: Diagnosis not present

## 2021-04-28 DIAGNOSIS — Z992 Dependence on renal dialysis: Secondary | ICD-10-CM | POA: Diagnosis not present

## 2021-04-28 DIAGNOSIS — Z23 Encounter for immunization: Secondary | ICD-10-CM | POA: Diagnosis not present

## 2021-04-29 DIAGNOSIS — Z23 Encounter for immunization: Secondary | ICD-10-CM | POA: Diagnosis not present

## 2021-04-29 DIAGNOSIS — D509 Iron deficiency anemia, unspecified: Secondary | ICD-10-CM | POA: Diagnosis not present

## 2021-04-29 DIAGNOSIS — D631 Anemia in chronic kidney disease: Secondary | ICD-10-CM | POA: Diagnosis not present

## 2021-04-29 DIAGNOSIS — N2581 Secondary hyperparathyroidism of renal origin: Secondary | ICD-10-CM | POA: Diagnosis not present

## 2021-04-29 DIAGNOSIS — N186 End stage renal disease: Secondary | ICD-10-CM | POA: Diagnosis not present

## 2021-04-29 DIAGNOSIS — Z992 Dependence on renal dialysis: Secondary | ICD-10-CM | POA: Diagnosis not present

## 2021-04-30 DIAGNOSIS — N186 End stage renal disease: Secondary | ICD-10-CM | POA: Diagnosis not present

## 2021-04-30 DIAGNOSIS — Z23 Encounter for immunization: Secondary | ICD-10-CM | POA: Diagnosis not present

## 2021-04-30 DIAGNOSIS — D509 Iron deficiency anemia, unspecified: Secondary | ICD-10-CM | POA: Diagnosis not present

## 2021-04-30 DIAGNOSIS — D631 Anemia in chronic kidney disease: Secondary | ICD-10-CM | POA: Diagnosis not present

## 2021-04-30 DIAGNOSIS — N2581 Secondary hyperparathyroidism of renal origin: Secondary | ICD-10-CM | POA: Diagnosis not present

## 2021-04-30 DIAGNOSIS — Z992 Dependence on renal dialysis: Secondary | ICD-10-CM | POA: Diagnosis not present

## 2021-05-01 DIAGNOSIS — N2581 Secondary hyperparathyroidism of renal origin: Secondary | ICD-10-CM | POA: Diagnosis not present

## 2021-05-01 DIAGNOSIS — D631 Anemia in chronic kidney disease: Secondary | ICD-10-CM | POA: Diagnosis not present

## 2021-05-01 DIAGNOSIS — Z23 Encounter for immunization: Secondary | ICD-10-CM | POA: Diagnosis not present

## 2021-05-01 DIAGNOSIS — D509 Iron deficiency anemia, unspecified: Secondary | ICD-10-CM | POA: Diagnosis not present

## 2021-05-01 DIAGNOSIS — Z992 Dependence on renal dialysis: Secondary | ICD-10-CM | POA: Diagnosis not present

## 2021-05-01 DIAGNOSIS — N186 End stage renal disease: Secondary | ICD-10-CM | POA: Diagnosis not present

## 2021-05-02 DIAGNOSIS — Z23 Encounter for immunization: Secondary | ICD-10-CM | POA: Diagnosis not present

## 2021-05-02 DIAGNOSIS — D509 Iron deficiency anemia, unspecified: Secondary | ICD-10-CM | POA: Diagnosis not present

## 2021-05-02 DIAGNOSIS — Z992 Dependence on renal dialysis: Secondary | ICD-10-CM | POA: Diagnosis not present

## 2021-05-02 DIAGNOSIS — N186 End stage renal disease: Secondary | ICD-10-CM | POA: Diagnosis not present

## 2021-05-02 DIAGNOSIS — D631 Anemia in chronic kidney disease: Secondary | ICD-10-CM | POA: Diagnosis not present

## 2021-05-02 DIAGNOSIS — N2581 Secondary hyperparathyroidism of renal origin: Secondary | ICD-10-CM | POA: Diagnosis not present

## 2021-05-03 DIAGNOSIS — D631 Anemia in chronic kidney disease: Secondary | ICD-10-CM | POA: Diagnosis not present

## 2021-05-03 DIAGNOSIS — Z992 Dependence on renal dialysis: Secondary | ICD-10-CM | POA: Diagnosis not present

## 2021-05-03 DIAGNOSIS — N186 End stage renal disease: Secondary | ICD-10-CM | POA: Diagnosis not present

## 2021-05-03 DIAGNOSIS — N2581 Secondary hyperparathyroidism of renal origin: Secondary | ICD-10-CM | POA: Diagnosis not present

## 2021-05-03 DIAGNOSIS — Z23 Encounter for immunization: Secondary | ICD-10-CM | POA: Diagnosis not present

## 2021-05-03 DIAGNOSIS — D509 Iron deficiency anemia, unspecified: Secondary | ICD-10-CM | POA: Diagnosis not present

## 2021-05-04 DIAGNOSIS — Z23 Encounter for immunization: Secondary | ICD-10-CM | POA: Diagnosis not present

## 2021-05-04 DIAGNOSIS — Z992 Dependence on renal dialysis: Secondary | ICD-10-CM | POA: Diagnosis not present

## 2021-05-04 DIAGNOSIS — D509 Iron deficiency anemia, unspecified: Secondary | ICD-10-CM | POA: Diagnosis not present

## 2021-05-04 DIAGNOSIS — N186 End stage renal disease: Secondary | ICD-10-CM | POA: Diagnosis not present

## 2021-05-04 DIAGNOSIS — D631 Anemia in chronic kidney disease: Secondary | ICD-10-CM | POA: Diagnosis not present

## 2021-05-04 DIAGNOSIS — N2581 Secondary hyperparathyroidism of renal origin: Secondary | ICD-10-CM | POA: Diagnosis not present

## 2021-05-05 DIAGNOSIS — Z23 Encounter for immunization: Secondary | ICD-10-CM | POA: Diagnosis not present

## 2021-05-05 DIAGNOSIS — N2581 Secondary hyperparathyroidism of renal origin: Secondary | ICD-10-CM | POA: Diagnosis not present

## 2021-05-05 DIAGNOSIS — N186 End stage renal disease: Secondary | ICD-10-CM | POA: Diagnosis not present

## 2021-05-05 DIAGNOSIS — Z992 Dependence on renal dialysis: Secondary | ICD-10-CM | POA: Diagnosis not present

## 2021-05-05 DIAGNOSIS — D509 Iron deficiency anemia, unspecified: Secondary | ICD-10-CM | POA: Diagnosis not present

## 2021-05-05 DIAGNOSIS — D631 Anemia in chronic kidney disease: Secondary | ICD-10-CM | POA: Diagnosis not present

## 2021-05-06 DIAGNOSIS — D509 Iron deficiency anemia, unspecified: Secondary | ICD-10-CM | POA: Diagnosis not present

## 2021-05-06 DIAGNOSIS — Z992 Dependence on renal dialysis: Secondary | ICD-10-CM | POA: Diagnosis not present

## 2021-05-06 DIAGNOSIS — D631 Anemia in chronic kidney disease: Secondary | ICD-10-CM | POA: Diagnosis not present

## 2021-05-06 DIAGNOSIS — N2581 Secondary hyperparathyroidism of renal origin: Secondary | ICD-10-CM | POA: Diagnosis not present

## 2021-05-06 DIAGNOSIS — N186 End stage renal disease: Secondary | ICD-10-CM | POA: Diagnosis not present

## 2021-05-06 DIAGNOSIS — Z23 Encounter for immunization: Secondary | ICD-10-CM | POA: Diagnosis not present

## 2021-05-07 DIAGNOSIS — D631 Anemia in chronic kidney disease: Secondary | ICD-10-CM | POA: Diagnosis not present

## 2021-05-07 DIAGNOSIS — N2581 Secondary hyperparathyroidism of renal origin: Secondary | ICD-10-CM | POA: Diagnosis not present

## 2021-05-07 DIAGNOSIS — Z23 Encounter for immunization: Secondary | ICD-10-CM | POA: Diagnosis not present

## 2021-05-07 DIAGNOSIS — Z992 Dependence on renal dialysis: Secondary | ICD-10-CM | POA: Diagnosis not present

## 2021-05-07 DIAGNOSIS — N186 End stage renal disease: Secondary | ICD-10-CM | POA: Diagnosis not present

## 2021-05-07 DIAGNOSIS — D509 Iron deficiency anemia, unspecified: Secondary | ICD-10-CM | POA: Diagnosis not present

## 2021-05-08 DIAGNOSIS — Z992 Dependence on renal dialysis: Secondary | ICD-10-CM | POA: Diagnosis not present

## 2021-05-08 DIAGNOSIS — Z23 Encounter for immunization: Secondary | ICD-10-CM | POA: Diagnosis not present

## 2021-05-08 DIAGNOSIS — N2581 Secondary hyperparathyroidism of renal origin: Secondary | ICD-10-CM | POA: Diagnosis not present

## 2021-05-08 DIAGNOSIS — D631 Anemia in chronic kidney disease: Secondary | ICD-10-CM | POA: Diagnosis not present

## 2021-05-08 DIAGNOSIS — D509 Iron deficiency anemia, unspecified: Secondary | ICD-10-CM | POA: Diagnosis not present

## 2021-05-08 DIAGNOSIS — N186 End stage renal disease: Secondary | ICD-10-CM | POA: Diagnosis not present

## 2021-05-09 ENCOUNTER — Ambulatory Visit: Payer: Medicare Other

## 2021-05-09 ENCOUNTER — Other Ambulatory Visit: Payer: Self-pay | Admitting: Orthopedic Surgery

## 2021-05-09 ENCOUNTER — Other Ambulatory Visit: Payer: Self-pay

## 2021-05-09 ENCOUNTER — Ambulatory Visit (INDEPENDENT_AMBULATORY_CARE_PROVIDER_SITE_OTHER): Payer: Medicare Other | Admitting: Orthopedic Surgery

## 2021-05-09 ENCOUNTER — Encounter: Payer: Self-pay | Admitting: Orthopedic Surgery

## 2021-05-09 VITALS — BP 130/81 | HR 76 | Ht 64.0 in | Wt 229.6 lb

## 2021-05-09 DIAGNOSIS — I2510 Atherosclerotic heart disease of native coronary artery without angina pectoris: Secondary | ICD-10-CM | POA: Diagnosis not present

## 2021-05-09 DIAGNOSIS — D509 Iron deficiency anemia, unspecified: Secondary | ICD-10-CM | POA: Diagnosis not present

## 2021-05-09 DIAGNOSIS — D631 Anemia in chronic kidney disease: Secondary | ICD-10-CM | POA: Diagnosis not present

## 2021-05-09 DIAGNOSIS — M545 Low back pain, unspecified: Secondary | ICD-10-CM

## 2021-05-09 DIAGNOSIS — M25562 Pain in left knee: Secondary | ICD-10-CM

## 2021-05-09 DIAGNOSIS — N186 End stage renal disease: Secondary | ICD-10-CM | POA: Diagnosis not present

## 2021-05-09 DIAGNOSIS — Z23 Encounter for immunization: Secondary | ICD-10-CM | POA: Diagnosis not present

## 2021-05-09 DIAGNOSIS — Z992 Dependence on renal dialysis: Secondary | ICD-10-CM | POA: Diagnosis not present

## 2021-05-09 DIAGNOSIS — N2581 Secondary hyperparathyroidism of renal origin: Secondary | ICD-10-CM | POA: Diagnosis not present

## 2021-05-09 MED ORDER — HYDROCODONE-ACETAMINOPHEN 5-325 MG PO TABS: 1.0000 | ORAL_TABLET | Freq: Four times a day (QID) | ORAL | 0 refills | Status: DC | PRN

## 2021-05-09 NOTE — Progress Notes (Signed)
Chief Complaint  Patient presents with   Knee Pain    Left/knee popped out 05/04/21   70 year old male previously operated knee was sleeping and his knee popped he was not doing anything he had hurt the knee  He has multiple medical problems listed as noted in the history section  His exam is notable for tenderness around the iliotibial band knee is stable meniscal signs are negative quadriceps and extensor mechanism intact  X-ray shows: Arthritis small joint effusion minimal secondary bone changes no acute fracture  Meds ordered this encounter  Medications   HYDROcodone-acetaminophen (NORCO/VICODIN) 5-325 MG tablet    Sig: Take 1 tablet by mouth every 6 (six) hours as needed for moderate pain.    Dispense:  30 tablet    Refill:  0   Procedure note  Injection  Verbal consent was obtained to inject the left knee  Timeout procedure was completed to confirm injection site  Diagnosis acute pain left knee  Medications used Celestone Lidocaine 1% plain 3 cc  Anesthesia was provided by ethyl chloride spray  Prep was performed with alcohol  Technique of injection left knee flexion 90 degrees lateral portal  No complications were noted

## 2021-05-10 DIAGNOSIS — N186 End stage renal disease: Secondary | ICD-10-CM | POA: Diagnosis not present

## 2021-05-10 DIAGNOSIS — D631 Anemia in chronic kidney disease: Secondary | ICD-10-CM | POA: Diagnosis not present

## 2021-05-10 DIAGNOSIS — Z23 Encounter for immunization: Secondary | ICD-10-CM | POA: Diagnosis not present

## 2021-05-10 DIAGNOSIS — D509 Iron deficiency anemia, unspecified: Secondary | ICD-10-CM | POA: Diagnosis not present

## 2021-05-10 DIAGNOSIS — N2581 Secondary hyperparathyroidism of renal origin: Secondary | ICD-10-CM | POA: Diagnosis not present

## 2021-05-10 DIAGNOSIS — Z992 Dependence on renal dialysis: Secondary | ICD-10-CM | POA: Diagnosis not present

## 2021-05-11 DIAGNOSIS — N186 End stage renal disease: Secondary | ICD-10-CM | POA: Diagnosis not present

## 2021-05-11 DIAGNOSIS — N2581 Secondary hyperparathyroidism of renal origin: Secondary | ICD-10-CM | POA: Diagnosis not present

## 2021-05-11 DIAGNOSIS — D509 Iron deficiency anemia, unspecified: Secondary | ICD-10-CM | POA: Diagnosis not present

## 2021-05-11 DIAGNOSIS — D631 Anemia in chronic kidney disease: Secondary | ICD-10-CM | POA: Diagnosis not present

## 2021-05-11 DIAGNOSIS — Z23 Encounter for immunization: Secondary | ICD-10-CM | POA: Diagnosis not present

## 2021-05-11 DIAGNOSIS — Z992 Dependence on renal dialysis: Secondary | ICD-10-CM | POA: Diagnosis not present

## 2021-05-12 DIAGNOSIS — D509 Iron deficiency anemia, unspecified: Secondary | ICD-10-CM | POA: Diagnosis not present

## 2021-05-12 DIAGNOSIS — N186 End stage renal disease: Secondary | ICD-10-CM | POA: Diagnosis not present

## 2021-05-12 DIAGNOSIS — Z992 Dependence on renal dialysis: Secondary | ICD-10-CM | POA: Diagnosis not present

## 2021-05-12 DIAGNOSIS — D631 Anemia in chronic kidney disease: Secondary | ICD-10-CM | POA: Diagnosis not present

## 2021-05-12 DIAGNOSIS — Z23 Encounter for immunization: Secondary | ICD-10-CM | POA: Diagnosis not present

## 2021-05-12 DIAGNOSIS — N2581 Secondary hyperparathyroidism of renal origin: Secondary | ICD-10-CM | POA: Diagnosis not present

## 2021-05-13 DIAGNOSIS — D509 Iron deficiency anemia, unspecified: Secondary | ICD-10-CM | POA: Diagnosis not present

## 2021-05-13 DIAGNOSIS — Z992 Dependence on renal dialysis: Secondary | ICD-10-CM | POA: Diagnosis not present

## 2021-05-13 DIAGNOSIS — N186 End stage renal disease: Secondary | ICD-10-CM | POA: Diagnosis not present

## 2021-05-13 DIAGNOSIS — Z23 Encounter for immunization: Secondary | ICD-10-CM | POA: Diagnosis not present

## 2021-05-13 DIAGNOSIS — D631 Anemia in chronic kidney disease: Secondary | ICD-10-CM | POA: Diagnosis not present

## 2021-05-13 DIAGNOSIS — N2581 Secondary hyperparathyroidism of renal origin: Secondary | ICD-10-CM | POA: Diagnosis not present

## 2021-05-14 DIAGNOSIS — D509 Iron deficiency anemia, unspecified: Secondary | ICD-10-CM | POA: Diagnosis not present

## 2021-05-14 DIAGNOSIS — Z23 Encounter for immunization: Secondary | ICD-10-CM | POA: Diagnosis not present

## 2021-05-14 DIAGNOSIS — Z992 Dependence on renal dialysis: Secondary | ICD-10-CM | POA: Diagnosis not present

## 2021-05-14 DIAGNOSIS — N186 End stage renal disease: Secondary | ICD-10-CM | POA: Diagnosis not present

## 2021-05-14 DIAGNOSIS — N2581 Secondary hyperparathyroidism of renal origin: Secondary | ICD-10-CM | POA: Diagnosis not present

## 2021-05-14 DIAGNOSIS — D631 Anemia in chronic kidney disease: Secondary | ICD-10-CM | POA: Diagnosis not present

## 2021-05-15 DIAGNOSIS — Z23 Encounter for immunization: Secondary | ICD-10-CM | POA: Diagnosis not present

## 2021-05-15 DIAGNOSIS — D509 Iron deficiency anemia, unspecified: Secondary | ICD-10-CM | POA: Diagnosis not present

## 2021-05-15 DIAGNOSIS — D631 Anemia in chronic kidney disease: Secondary | ICD-10-CM | POA: Diagnosis not present

## 2021-05-15 DIAGNOSIS — N186 End stage renal disease: Secondary | ICD-10-CM | POA: Diagnosis not present

## 2021-05-15 DIAGNOSIS — Z992 Dependence on renal dialysis: Secondary | ICD-10-CM | POA: Diagnosis not present

## 2021-05-15 DIAGNOSIS — N2581 Secondary hyperparathyroidism of renal origin: Secondary | ICD-10-CM | POA: Diagnosis not present

## 2021-05-16 DIAGNOSIS — N2581 Secondary hyperparathyroidism of renal origin: Secondary | ICD-10-CM | POA: Diagnosis not present

## 2021-05-16 DIAGNOSIS — Z23 Encounter for immunization: Secondary | ICD-10-CM | POA: Diagnosis not present

## 2021-05-16 DIAGNOSIS — D631 Anemia in chronic kidney disease: Secondary | ICD-10-CM | POA: Diagnosis not present

## 2021-05-16 DIAGNOSIS — Z992 Dependence on renal dialysis: Secondary | ICD-10-CM | POA: Diagnosis not present

## 2021-05-16 DIAGNOSIS — D509 Iron deficiency anemia, unspecified: Secondary | ICD-10-CM | POA: Diagnosis not present

## 2021-05-16 DIAGNOSIS — N186 End stage renal disease: Secondary | ICD-10-CM | POA: Diagnosis not present

## 2021-05-17 DIAGNOSIS — D631 Anemia in chronic kidney disease: Secondary | ICD-10-CM | POA: Diagnosis not present

## 2021-05-17 DIAGNOSIS — D509 Iron deficiency anemia, unspecified: Secondary | ICD-10-CM | POA: Diagnosis not present

## 2021-05-17 DIAGNOSIS — N186 End stage renal disease: Secondary | ICD-10-CM | POA: Diagnosis not present

## 2021-05-17 DIAGNOSIS — Z992 Dependence on renal dialysis: Secondary | ICD-10-CM | POA: Diagnosis not present

## 2021-05-17 DIAGNOSIS — N2581 Secondary hyperparathyroidism of renal origin: Secondary | ICD-10-CM | POA: Diagnosis not present

## 2021-05-18 DIAGNOSIS — D509 Iron deficiency anemia, unspecified: Secondary | ICD-10-CM | POA: Diagnosis not present

## 2021-05-18 DIAGNOSIS — N2581 Secondary hyperparathyroidism of renal origin: Secondary | ICD-10-CM | POA: Diagnosis not present

## 2021-05-18 DIAGNOSIS — Z992 Dependence on renal dialysis: Secondary | ICD-10-CM | POA: Diagnosis not present

## 2021-05-18 DIAGNOSIS — N186 End stage renal disease: Secondary | ICD-10-CM | POA: Diagnosis not present

## 2021-05-18 DIAGNOSIS — D631 Anemia in chronic kidney disease: Secondary | ICD-10-CM | POA: Diagnosis not present

## 2021-05-19 DIAGNOSIS — Z992 Dependence on renal dialysis: Secondary | ICD-10-CM | POA: Diagnosis not present

## 2021-05-19 DIAGNOSIS — D631 Anemia in chronic kidney disease: Secondary | ICD-10-CM | POA: Diagnosis not present

## 2021-05-19 DIAGNOSIS — N186 End stage renal disease: Secondary | ICD-10-CM | POA: Diagnosis not present

## 2021-05-19 DIAGNOSIS — N2581 Secondary hyperparathyroidism of renal origin: Secondary | ICD-10-CM | POA: Diagnosis not present

## 2021-05-19 DIAGNOSIS — D509 Iron deficiency anemia, unspecified: Secondary | ICD-10-CM | POA: Diagnosis not present

## 2021-05-20 DIAGNOSIS — D509 Iron deficiency anemia, unspecified: Secondary | ICD-10-CM | POA: Diagnosis not present

## 2021-05-20 DIAGNOSIS — N186 End stage renal disease: Secondary | ICD-10-CM | POA: Diagnosis not present

## 2021-05-20 DIAGNOSIS — Z992 Dependence on renal dialysis: Secondary | ICD-10-CM | POA: Diagnosis not present

## 2021-05-20 DIAGNOSIS — N2581 Secondary hyperparathyroidism of renal origin: Secondary | ICD-10-CM | POA: Diagnosis not present

## 2021-05-20 DIAGNOSIS — D631 Anemia in chronic kidney disease: Secondary | ICD-10-CM | POA: Diagnosis not present

## 2021-05-21 DIAGNOSIS — Z992 Dependence on renal dialysis: Secondary | ICD-10-CM | POA: Diagnosis not present

## 2021-05-21 DIAGNOSIS — D509 Iron deficiency anemia, unspecified: Secondary | ICD-10-CM | POA: Diagnosis not present

## 2021-05-21 DIAGNOSIS — N2581 Secondary hyperparathyroidism of renal origin: Secondary | ICD-10-CM | POA: Diagnosis not present

## 2021-05-21 DIAGNOSIS — N186 End stage renal disease: Secondary | ICD-10-CM | POA: Diagnosis not present

## 2021-05-21 DIAGNOSIS — D631 Anemia in chronic kidney disease: Secondary | ICD-10-CM | POA: Diagnosis not present

## 2021-05-22 DIAGNOSIS — D509 Iron deficiency anemia, unspecified: Secondary | ICD-10-CM | POA: Diagnosis not present

## 2021-05-22 DIAGNOSIS — Z992 Dependence on renal dialysis: Secondary | ICD-10-CM | POA: Diagnosis not present

## 2021-05-22 DIAGNOSIS — N2581 Secondary hyperparathyroidism of renal origin: Secondary | ICD-10-CM | POA: Diagnosis not present

## 2021-05-22 DIAGNOSIS — N186 End stage renal disease: Secondary | ICD-10-CM | POA: Diagnosis not present

## 2021-05-22 DIAGNOSIS — D631 Anemia in chronic kidney disease: Secondary | ICD-10-CM | POA: Diagnosis not present

## 2021-05-23 DIAGNOSIS — Z992 Dependence on renal dialysis: Secondary | ICD-10-CM | POA: Diagnosis not present

## 2021-05-23 DIAGNOSIS — Z961 Presence of intraocular lens: Secondary | ICD-10-CM | POA: Diagnosis not present

## 2021-05-23 DIAGNOSIS — N186 End stage renal disease: Secondary | ICD-10-CM | POA: Diagnosis not present

## 2021-05-23 DIAGNOSIS — E119 Type 2 diabetes mellitus without complications: Secondary | ICD-10-CM | POA: Diagnosis not present

## 2021-05-23 DIAGNOSIS — N2581 Secondary hyperparathyroidism of renal origin: Secondary | ICD-10-CM | POA: Diagnosis not present

## 2021-05-23 DIAGNOSIS — D509 Iron deficiency anemia, unspecified: Secondary | ICD-10-CM | POA: Diagnosis not present

## 2021-05-23 DIAGNOSIS — D631 Anemia in chronic kidney disease: Secondary | ICD-10-CM | POA: Diagnosis not present

## 2021-05-24 DIAGNOSIS — Z992 Dependence on renal dialysis: Secondary | ICD-10-CM | POA: Diagnosis not present

## 2021-05-24 DIAGNOSIS — D631 Anemia in chronic kidney disease: Secondary | ICD-10-CM | POA: Diagnosis not present

## 2021-05-24 DIAGNOSIS — N2581 Secondary hyperparathyroidism of renal origin: Secondary | ICD-10-CM | POA: Diagnosis not present

## 2021-05-24 DIAGNOSIS — D509 Iron deficiency anemia, unspecified: Secondary | ICD-10-CM | POA: Diagnosis not present

## 2021-05-24 DIAGNOSIS — N186 End stage renal disease: Secondary | ICD-10-CM | POA: Diagnosis not present

## 2021-05-25 DIAGNOSIS — Z992 Dependence on renal dialysis: Secondary | ICD-10-CM | POA: Diagnosis not present

## 2021-05-25 DIAGNOSIS — N186 End stage renal disease: Secondary | ICD-10-CM | POA: Diagnosis not present

## 2021-05-25 DIAGNOSIS — D631 Anemia in chronic kidney disease: Secondary | ICD-10-CM | POA: Diagnosis not present

## 2021-05-25 DIAGNOSIS — D509 Iron deficiency anemia, unspecified: Secondary | ICD-10-CM | POA: Diagnosis not present

## 2021-05-25 DIAGNOSIS — N2581 Secondary hyperparathyroidism of renal origin: Secondary | ICD-10-CM | POA: Diagnosis not present

## 2021-05-26 DIAGNOSIS — D631 Anemia in chronic kidney disease: Secondary | ICD-10-CM | POA: Diagnosis not present

## 2021-05-26 DIAGNOSIS — N2581 Secondary hyperparathyroidism of renal origin: Secondary | ICD-10-CM | POA: Diagnosis not present

## 2021-05-26 DIAGNOSIS — N186 End stage renal disease: Secondary | ICD-10-CM | POA: Diagnosis not present

## 2021-05-26 DIAGNOSIS — Z992 Dependence on renal dialysis: Secondary | ICD-10-CM | POA: Diagnosis not present

## 2021-05-26 DIAGNOSIS — D509 Iron deficiency anemia, unspecified: Secondary | ICD-10-CM | POA: Diagnosis not present

## 2021-05-27 DIAGNOSIS — N186 End stage renal disease: Secondary | ICD-10-CM | POA: Diagnosis not present

## 2021-05-27 DIAGNOSIS — N2581 Secondary hyperparathyroidism of renal origin: Secondary | ICD-10-CM | POA: Diagnosis not present

## 2021-05-27 DIAGNOSIS — Z992 Dependence on renal dialysis: Secondary | ICD-10-CM | POA: Diagnosis not present

## 2021-05-27 DIAGNOSIS — D509 Iron deficiency anemia, unspecified: Secondary | ICD-10-CM | POA: Diagnosis not present

## 2021-05-27 DIAGNOSIS — D631 Anemia in chronic kidney disease: Secondary | ICD-10-CM | POA: Diagnosis not present

## 2021-05-28 DIAGNOSIS — D631 Anemia in chronic kidney disease: Secondary | ICD-10-CM | POA: Diagnosis not present

## 2021-05-28 DIAGNOSIS — N186 End stage renal disease: Secondary | ICD-10-CM | POA: Diagnosis not present

## 2021-05-28 DIAGNOSIS — N2581 Secondary hyperparathyroidism of renal origin: Secondary | ICD-10-CM | POA: Diagnosis not present

## 2021-05-28 DIAGNOSIS — Z992 Dependence on renal dialysis: Secondary | ICD-10-CM | POA: Diagnosis not present

## 2021-05-28 DIAGNOSIS — D509 Iron deficiency anemia, unspecified: Secondary | ICD-10-CM | POA: Diagnosis not present

## 2021-05-29 DIAGNOSIS — N186 End stage renal disease: Secondary | ICD-10-CM | POA: Diagnosis not present

## 2021-05-29 DIAGNOSIS — Z992 Dependence on renal dialysis: Secondary | ICD-10-CM | POA: Diagnosis not present

## 2021-05-29 DIAGNOSIS — D631 Anemia in chronic kidney disease: Secondary | ICD-10-CM | POA: Diagnosis not present

## 2021-05-29 DIAGNOSIS — N2581 Secondary hyperparathyroidism of renal origin: Secondary | ICD-10-CM | POA: Diagnosis not present

## 2021-05-29 DIAGNOSIS — D509 Iron deficiency anemia, unspecified: Secondary | ICD-10-CM | POA: Diagnosis not present

## 2021-05-30 DIAGNOSIS — D631 Anemia in chronic kidney disease: Secondary | ICD-10-CM | POA: Diagnosis not present

## 2021-05-30 DIAGNOSIS — N2581 Secondary hyperparathyroidism of renal origin: Secondary | ICD-10-CM | POA: Diagnosis not present

## 2021-05-30 DIAGNOSIS — Z992 Dependence on renal dialysis: Secondary | ICD-10-CM | POA: Diagnosis not present

## 2021-05-30 DIAGNOSIS — N186 End stage renal disease: Secondary | ICD-10-CM | POA: Diagnosis not present

## 2021-05-30 DIAGNOSIS — D509 Iron deficiency anemia, unspecified: Secondary | ICD-10-CM | POA: Diagnosis not present

## 2021-05-31 DIAGNOSIS — Z992 Dependence on renal dialysis: Secondary | ICD-10-CM | POA: Diagnosis not present

## 2021-05-31 DIAGNOSIS — N2581 Secondary hyperparathyroidism of renal origin: Secondary | ICD-10-CM | POA: Diagnosis not present

## 2021-05-31 DIAGNOSIS — D509 Iron deficiency anemia, unspecified: Secondary | ICD-10-CM | POA: Diagnosis not present

## 2021-05-31 DIAGNOSIS — N186 End stage renal disease: Secondary | ICD-10-CM | POA: Diagnosis not present

## 2021-05-31 DIAGNOSIS — D631 Anemia in chronic kidney disease: Secondary | ICD-10-CM | POA: Diagnosis not present

## 2021-06-01 DIAGNOSIS — N2581 Secondary hyperparathyroidism of renal origin: Secondary | ICD-10-CM | POA: Diagnosis not present

## 2021-06-01 DIAGNOSIS — D631 Anemia in chronic kidney disease: Secondary | ICD-10-CM | POA: Diagnosis not present

## 2021-06-01 DIAGNOSIS — Z992 Dependence on renal dialysis: Secondary | ICD-10-CM | POA: Diagnosis not present

## 2021-06-01 DIAGNOSIS — N186 End stage renal disease: Secondary | ICD-10-CM | POA: Diagnosis not present

## 2021-06-01 DIAGNOSIS — D509 Iron deficiency anemia, unspecified: Secondary | ICD-10-CM | POA: Diagnosis not present

## 2021-06-02 DIAGNOSIS — D631 Anemia in chronic kidney disease: Secondary | ICD-10-CM | POA: Diagnosis not present

## 2021-06-02 DIAGNOSIS — N2581 Secondary hyperparathyroidism of renal origin: Secondary | ICD-10-CM | POA: Diagnosis not present

## 2021-06-02 DIAGNOSIS — D509 Iron deficiency anemia, unspecified: Secondary | ICD-10-CM | POA: Diagnosis not present

## 2021-06-02 DIAGNOSIS — N186 End stage renal disease: Secondary | ICD-10-CM | POA: Diagnosis not present

## 2021-06-02 DIAGNOSIS — Z992 Dependence on renal dialysis: Secondary | ICD-10-CM | POA: Diagnosis not present

## 2021-06-03 DIAGNOSIS — N186 End stage renal disease: Secondary | ICD-10-CM | POA: Diagnosis not present

## 2021-06-03 DIAGNOSIS — Z992 Dependence on renal dialysis: Secondary | ICD-10-CM | POA: Diagnosis not present

## 2021-06-03 DIAGNOSIS — D509 Iron deficiency anemia, unspecified: Secondary | ICD-10-CM | POA: Diagnosis not present

## 2021-06-03 DIAGNOSIS — N2581 Secondary hyperparathyroidism of renal origin: Secondary | ICD-10-CM | POA: Diagnosis not present

## 2021-06-03 DIAGNOSIS — D631 Anemia in chronic kidney disease: Secondary | ICD-10-CM | POA: Diagnosis not present

## 2021-06-04 DIAGNOSIS — N186 End stage renal disease: Secondary | ICD-10-CM | POA: Diagnosis not present

## 2021-06-04 DIAGNOSIS — D631 Anemia in chronic kidney disease: Secondary | ICD-10-CM | POA: Diagnosis not present

## 2021-06-04 DIAGNOSIS — N2581 Secondary hyperparathyroidism of renal origin: Secondary | ICD-10-CM | POA: Diagnosis not present

## 2021-06-04 DIAGNOSIS — Z992 Dependence on renal dialysis: Secondary | ICD-10-CM | POA: Diagnosis not present

## 2021-06-04 DIAGNOSIS — D509 Iron deficiency anemia, unspecified: Secondary | ICD-10-CM | POA: Diagnosis not present

## 2021-06-05 DIAGNOSIS — D509 Iron deficiency anemia, unspecified: Secondary | ICD-10-CM | POA: Diagnosis not present

## 2021-06-05 DIAGNOSIS — N2581 Secondary hyperparathyroidism of renal origin: Secondary | ICD-10-CM | POA: Diagnosis not present

## 2021-06-05 DIAGNOSIS — D631 Anemia in chronic kidney disease: Secondary | ICD-10-CM | POA: Diagnosis not present

## 2021-06-05 DIAGNOSIS — Z992 Dependence on renal dialysis: Secondary | ICD-10-CM | POA: Diagnosis not present

## 2021-06-05 DIAGNOSIS — N186 End stage renal disease: Secondary | ICD-10-CM | POA: Diagnosis not present

## 2021-06-06 DIAGNOSIS — N2581 Secondary hyperparathyroidism of renal origin: Secondary | ICD-10-CM | POA: Diagnosis not present

## 2021-06-06 DIAGNOSIS — N186 End stage renal disease: Secondary | ICD-10-CM | POA: Diagnosis not present

## 2021-06-06 DIAGNOSIS — D509 Iron deficiency anemia, unspecified: Secondary | ICD-10-CM | POA: Diagnosis not present

## 2021-06-06 DIAGNOSIS — Z992 Dependence on renal dialysis: Secondary | ICD-10-CM | POA: Diagnosis not present

## 2021-06-06 DIAGNOSIS — D631 Anemia in chronic kidney disease: Secondary | ICD-10-CM | POA: Diagnosis not present

## 2021-06-07 DIAGNOSIS — Z992 Dependence on renal dialysis: Secondary | ICD-10-CM | POA: Diagnosis not present

## 2021-06-07 DIAGNOSIS — N2581 Secondary hyperparathyroidism of renal origin: Secondary | ICD-10-CM | POA: Diagnosis not present

## 2021-06-07 DIAGNOSIS — D631 Anemia in chronic kidney disease: Secondary | ICD-10-CM | POA: Diagnosis not present

## 2021-06-07 DIAGNOSIS — N186 End stage renal disease: Secondary | ICD-10-CM | POA: Diagnosis not present

## 2021-06-07 DIAGNOSIS — D509 Iron deficiency anemia, unspecified: Secondary | ICD-10-CM | POA: Diagnosis not present

## 2021-06-08 DIAGNOSIS — D509 Iron deficiency anemia, unspecified: Secondary | ICD-10-CM | POA: Diagnosis not present

## 2021-06-08 DIAGNOSIS — D631 Anemia in chronic kidney disease: Secondary | ICD-10-CM | POA: Diagnosis not present

## 2021-06-08 DIAGNOSIS — N186 End stage renal disease: Secondary | ICD-10-CM | POA: Diagnosis not present

## 2021-06-08 DIAGNOSIS — N2581 Secondary hyperparathyroidism of renal origin: Secondary | ICD-10-CM | POA: Diagnosis not present

## 2021-06-08 DIAGNOSIS — Z992 Dependence on renal dialysis: Secondary | ICD-10-CM | POA: Diagnosis not present

## 2021-06-09 DIAGNOSIS — D631 Anemia in chronic kidney disease: Secondary | ICD-10-CM | POA: Diagnosis not present

## 2021-06-09 DIAGNOSIS — Z992 Dependence on renal dialysis: Secondary | ICD-10-CM | POA: Diagnosis not present

## 2021-06-09 DIAGNOSIS — N186 End stage renal disease: Secondary | ICD-10-CM | POA: Diagnosis not present

## 2021-06-09 DIAGNOSIS — N2581 Secondary hyperparathyroidism of renal origin: Secondary | ICD-10-CM | POA: Diagnosis not present

## 2021-06-09 DIAGNOSIS — D509 Iron deficiency anemia, unspecified: Secondary | ICD-10-CM | POA: Diagnosis not present

## 2021-06-10 DIAGNOSIS — N186 End stage renal disease: Secondary | ICD-10-CM | POA: Diagnosis not present

## 2021-06-10 DIAGNOSIS — N185 Chronic kidney disease, stage 5: Secondary | ICD-10-CM | POA: Diagnosis not present

## 2021-06-10 DIAGNOSIS — D509 Iron deficiency anemia, unspecified: Secondary | ICD-10-CM | POA: Diagnosis not present

## 2021-06-10 DIAGNOSIS — N2581 Secondary hyperparathyroidism of renal origin: Secondary | ICD-10-CM | POA: Diagnosis not present

## 2021-06-10 DIAGNOSIS — D631 Anemia in chronic kidney disease: Secondary | ICD-10-CM | POA: Diagnosis not present

## 2021-06-10 DIAGNOSIS — E1122 Type 2 diabetes mellitus with diabetic chronic kidney disease: Secondary | ICD-10-CM | POA: Diagnosis not present

## 2021-06-10 DIAGNOSIS — Z992 Dependence on renal dialysis: Secondary | ICD-10-CM | POA: Diagnosis not present

## 2021-06-11 DIAGNOSIS — Z992 Dependence on renal dialysis: Secondary | ICD-10-CM | POA: Diagnosis not present

## 2021-06-11 DIAGNOSIS — D631 Anemia in chronic kidney disease: Secondary | ICD-10-CM | POA: Diagnosis not present

## 2021-06-11 DIAGNOSIS — N186 End stage renal disease: Secondary | ICD-10-CM | POA: Diagnosis not present

## 2021-06-11 DIAGNOSIS — N2581 Secondary hyperparathyroidism of renal origin: Secondary | ICD-10-CM | POA: Diagnosis not present

## 2021-06-11 DIAGNOSIS — D509 Iron deficiency anemia, unspecified: Secondary | ICD-10-CM | POA: Diagnosis not present

## 2021-06-11 LAB — TSH: TSH: 1.95 u[IU]/mL (ref 0.450–4.500)

## 2021-06-11 LAB — LIPID PANEL
Chol/HDL Ratio: 3.5 ratio (ref 0.0–5.0)
Cholesterol, Total: 104 mg/dL (ref 100–199)
HDL: 30 mg/dL — ABNORMAL LOW (ref >39)
LDL Chol Calc (NIH): 53 mg/dL (ref 0–99)
Triglycerides: 110 mg/dL (ref 0–149)
VLDL Cholesterol Cal: 21 mg/dL (ref 5–40)

## 2021-06-11 LAB — T4, FREE: Free T4: 1.07 ng/dL (ref 0.82–1.77)

## 2021-06-12 DIAGNOSIS — D509 Iron deficiency anemia, unspecified: Secondary | ICD-10-CM | POA: Diagnosis not present

## 2021-06-12 DIAGNOSIS — N2581 Secondary hyperparathyroidism of renal origin: Secondary | ICD-10-CM | POA: Diagnosis not present

## 2021-06-12 DIAGNOSIS — Z992 Dependence on renal dialysis: Secondary | ICD-10-CM | POA: Diagnosis not present

## 2021-06-12 DIAGNOSIS — D631 Anemia in chronic kidney disease: Secondary | ICD-10-CM | POA: Diagnosis not present

## 2021-06-12 DIAGNOSIS — N186 End stage renal disease: Secondary | ICD-10-CM | POA: Diagnosis not present

## 2021-06-13 DIAGNOSIS — N2581 Secondary hyperparathyroidism of renal origin: Secondary | ICD-10-CM | POA: Diagnosis not present

## 2021-06-13 DIAGNOSIS — D509 Iron deficiency anemia, unspecified: Secondary | ICD-10-CM | POA: Diagnosis not present

## 2021-06-13 DIAGNOSIS — Z992 Dependence on renal dialysis: Secondary | ICD-10-CM | POA: Diagnosis not present

## 2021-06-13 DIAGNOSIS — N186 End stage renal disease: Secondary | ICD-10-CM | POA: Diagnosis not present

## 2021-06-13 DIAGNOSIS — D631 Anemia in chronic kidney disease: Secondary | ICD-10-CM | POA: Diagnosis not present

## 2021-06-14 DIAGNOSIS — N186 End stage renal disease: Secondary | ICD-10-CM | POA: Diagnosis not present

## 2021-06-14 DIAGNOSIS — D631 Anemia in chronic kidney disease: Secondary | ICD-10-CM | POA: Diagnosis not present

## 2021-06-14 DIAGNOSIS — N2581 Secondary hyperparathyroidism of renal origin: Secondary | ICD-10-CM | POA: Diagnosis not present

## 2021-06-14 DIAGNOSIS — D509 Iron deficiency anemia, unspecified: Secondary | ICD-10-CM | POA: Diagnosis not present

## 2021-06-14 DIAGNOSIS — Z992 Dependence on renal dialysis: Secondary | ICD-10-CM | POA: Diagnosis not present

## 2021-06-15 DIAGNOSIS — N2581 Secondary hyperparathyroidism of renal origin: Secondary | ICD-10-CM | POA: Diagnosis not present

## 2021-06-15 DIAGNOSIS — D631 Anemia in chronic kidney disease: Secondary | ICD-10-CM | POA: Diagnosis not present

## 2021-06-15 DIAGNOSIS — D509 Iron deficiency anemia, unspecified: Secondary | ICD-10-CM | POA: Diagnosis not present

## 2021-06-15 DIAGNOSIS — Z992 Dependence on renal dialysis: Secondary | ICD-10-CM | POA: Diagnosis not present

## 2021-06-15 DIAGNOSIS — N186 End stage renal disease: Secondary | ICD-10-CM | POA: Diagnosis not present

## 2021-06-15 NOTE — Patient Instructions (Signed)
Advice for Weight Management  -For most of us the best way to lose weight is by diet management. Generally speaking, diet management means consuming less calories intentionally which over time brings about progressive weight loss.  This can be achieved more effectively by restricting carbohydrate consumption to the minimum possible.  So, it is critically important to know your numbers: how much calorie you are consuming and how much calorie you need. More importantly, our carbohydrates sources should be unprocessed or minimally processed complex starch food items.   Sometimes, it is important to balance nutrition by increasing protein intake (animal or plant source), fruits, and vegetables.  -Sticking to a routine mealtime to eat 3 meals a day and avoiding unnecessary snacks is shown to have a big role in weight control. Under normal circumstances, the only time we lose real weight is when we are hungry, so allow hunger to take place- hunger means no food between meal times, only water.  It is not advisable to starve.   -It is better to avoid simple carbohydrates including: Cakes, Sweet Desserts, Ice Cream, Soda (diet and regular), Sweet Tea, Candies, Chips, Cookies, Store Bought Juices, Alcohol in Excess of  1-2 drinks a day, Artificial Sweeteners, Doughnuts, Coffee Creamers, "Sugar-free" Products, etc, etc.  This is not a complete list.....    -Consulting with certified diabetes educators is proven to provide you with the most accurate and current information on diet.  Also, you may be  interested in discussing diet options/exchanges , we can schedule a visit with [name] [name], RDN, CDE for individualized nutrition education.  -Exercise: If you are able: 30 -60 minutes a day ,4 days a week, or 150 minutes a week.  The longer the better.  Combine stretch, strength, and aerobic activities.  If you were told in the past that you have high risk for cardiovascular diseases, you may seek evaluation by  your heart doctor prior to initiating moderate to intense exercise programs.    

## 2021-06-16 ENCOUNTER — Ambulatory Visit (INDEPENDENT_AMBULATORY_CARE_PROVIDER_SITE_OTHER): Payer: Medicare Other | Admitting: Nurse Practitioner

## 2021-06-16 ENCOUNTER — Other Ambulatory Visit: Payer: Self-pay

## 2021-06-16 ENCOUNTER — Encounter: Payer: Self-pay | Admitting: Nurse Practitioner

## 2021-06-16 VITALS — BP 116/77 | HR 71 | Ht 64.0 in | Wt 232.0 lb

## 2021-06-16 DIAGNOSIS — E1122 Type 2 diabetes mellitus with diabetic chronic kidney disease: Secondary | ICD-10-CM | POA: Diagnosis not present

## 2021-06-16 DIAGNOSIS — I10 Essential (primary) hypertension: Secondary | ICD-10-CM

## 2021-06-16 DIAGNOSIS — N185 Chronic kidney disease, stage 5: Secondary | ICD-10-CM

## 2021-06-16 DIAGNOSIS — N2581 Secondary hyperparathyroidism of renal origin: Secondary | ICD-10-CM | POA: Diagnosis not present

## 2021-06-16 DIAGNOSIS — D509 Iron deficiency anemia, unspecified: Secondary | ICD-10-CM | POA: Diagnosis not present

## 2021-06-16 DIAGNOSIS — D631 Anemia in chronic kidney disease: Secondary | ICD-10-CM | POA: Diagnosis not present

## 2021-06-16 DIAGNOSIS — Z992 Dependence on renal dialysis: Secondary | ICD-10-CM | POA: Diagnosis not present

## 2021-06-16 DIAGNOSIS — E782 Mixed hyperlipidemia: Secondary | ICD-10-CM

## 2021-06-16 DIAGNOSIS — N186 End stage renal disease: Secondary | ICD-10-CM | POA: Diagnosis not present

## 2021-06-16 LAB — POCT GLYCOSYLATED HEMOGLOBIN (HGB A1C): HbA1c, POC (controlled diabetic range): 6.2 % (ref 0.0–7.0)

## 2021-06-16 NOTE — Progress Notes (Signed)
06/16/2021   Endocrinology follow-up note   Subjective:    Patient ID: [name] [name], male    DOB: 03-28-1951, PCP [name], MD   Past Medical History:  Diagnosis Date   Anemia    Arthritis    Asthma    Bell palsy    CAD (coronary artery disease)    a. 2014 MV: abnl w/ infap ischemia; b. 03/2013 Cath: aneurysmal bleb in the LAD w/ otw nonobs dzs-->Med Rx.   Chronic back pain    Chronic knee pain    a. 09/2015 s/p R TKA.   Chronic pain    Chronic shoulder pain    Chronic sinusitis    CKD (chronic kidney disease), stage IV (HCC)    stage 4 per office visit note of Dr Lowanda Foster on 05/2015    COPD (chronic obstructive pulmonary disease) ([employer])    Diabetes mellitus without complication ([place])    type II    Essential hypertension    GERD (gastroesophageal reflux disease)    Gout    Gout    Hepatomegaly    noted on noncontrast CT 2015   History of hiatal hernia    Hyperlipidemia    Lateral meniscus tear    Obesity    Truncal   Obstructive sleep apnea    does not use cpap    On home oxygen therapy    uses 2l when is going somewhere per patient    PUD (peptic ulcer disease)    remote, reports f/u EGD about 8 years ago unremarkable    Reactive airway disease    related to exposure to chemical during 9/11   Renal insufficiency    Sinusitis    Vitamin D deficiency    Past Surgical History:  Procedure Laterality Date   ASAD LT SHOULDER  12/2008   left shoulder   AV FISTULA PLACEMENT Left 08/09/2016   Procedure: BRACHIOCEPHALIC ARTERIOVENOUS (AV) FISTULA CREATION LEFT ARM;  Surgeon: [name], MD;  Location: [place] [facility];  Service: Vascular;  Laterality: Left;   CAPD INSERTION N/A 10/07/2018   Procedure: LAPAROSCOPIC PERITONEAL CATHETER PLACEMENT;  Surgeon: Mickeal Skinner, MD;  Location: [facility];  Service: General;  Laterality: N/A;   CATARACT EXTRACTION W/PHACO Left 03/28/2016   Procedure: CATARACT EXTRACTION PHACO AND INTRAOCULAR LENS PLACEMENT  LEFT EYE;  Surgeon: [name], MD;  Location: [facility];  Service: Ophthalmology;  Laterality: Left;  CDE: 4.77   CATARACT EXTRACTION W/PHACO Right 04/11/2016   Procedure: CATARACT EXTRACTION PHACO AND INTRAOCULAR LENS PLACEMENT RIGHT EYE; CDE:  4.74;  Surgeon: [name], MD;  Location: [facility];  Service: Ophthalmology;  Laterality: Right;   COLONOSCOPY  10/2008   Fields: Rectal polyp obliterated, not retrieved, hemorrhoids, single ascending colon diverticulum near the CV. Next colonoscopy April 2020   COLONOSCOPY N/A 12/25/2014   SLF: 1. Colorectal polyps (2) removed 2. Small internal hemorrhoids 3. the left colon is severely redundant   DOPPLER ECHOCARDIOGRAPHY     ESOPHAGOGASTRODUODENOSCOPY N/A 12/25/2014   SLF: 1. Anemia most likely due to CRI, gastritis, gastric polyps 2. Moderate non-erosive gastriits and mild duodenitis.  3.TWo large gstric polyps removed.    EYE SURGERY  12/22/2010   tear duct probing-[place]   FOREIGN BODY REMOVAL  03/29/2011   Procedure: REMOVAL FOREIGN BODY EXTREMITY;  Surgeon: Arther Abbott, MD;  Location[facility];  Service: Orthopedics;  Laterality: Right;  Removal Foreign Body Right Thumb  IR FLUORO GUIDE CV LINE RIGHT  08/06/2018   IR US GUIDE VASC ACCESS RIGHT  08/06/2018   KNEE ARTHROSCOPY  10/2007   left   KNEE ARTHROSCOPY WITH LATERAL MENISECTOMY Right 10/14/2015   Procedure: LEFT KNEE ARTHROSCOPY WITH PARTIAL LATERAL MENISECTOMY;  Surgeon: Carole Civil, MD;  [facility];  Service: Orthopedics;  Laterality: Right;   LEFT HEART CATHETERIZATION WITH CORONARY ANGIOGRAM N/A 03/28/2013   Procedure: LEFT HEART CATHETERIZATION WITH CORONARY ANGIOGRAM;  Surgeon: Leonie Man, MD;  Location: [facility];  Service: Cardiovascular;  Laterality: N/A;   NM MYOVIEW LTD     PENILE PROSTHESIS IMPLANT N/A 08/16/2015   Procedure: PENILE PROTHESIS INFLATABLE, three piece, Excisional biopsy of Penile ulcer, Penile molding;  Surgeon: Carolan Clines, MD;  [facility];  Service: Urology;  Laterality: N/A;   PENILE PROSTHESIS IMPLANT N/A 12/24/2017   Procedure: REMOVAL AND  REPLACEMENT  COLOPLAST PENILE PROSTHESIS;  Surgeon: Lucas Mallow, MD;  [facility];  Service: Urology;  Laterality: N/A;   QUADRICEPS TENDON REPAIR  07/21/2011   Procedure: REPAIR QUADRICEP TENDON;  Surgeon: Arther Abbott, MD;  [facility];  Service: Orthopedics;  Laterality: Right;   TOENAIL EXCISION     removed [id number]   UMBILICAL HERNIA REPAIR  2007   [place]   Social History   Socioeconomic History   Marital status: Married    Spouse name: Not on file   Number of children: 2   Years of education: 12th grade   Highest education level: Not on file  Occupational History   Occupation: disabled   Occupation:      Fish farm manager: UNEMPLOYED  Tobacco Use   Smoking status: Former    Packs/day: 1.00    Years: 25.00    Pack years: 25.00    Types: Cigarettes    Quit date: 03/27/2010    Years since quitting: 11.2   Smokeless tobacco: Never   Tobacco comments:    Quit x 7 years  Vaping Use   Vaping Use: Never used  Substance and Sexual Activity   Alcohol use: Yes    Alcohol/week: 0.0 standard drinks    Comment: occasionally   Drug use: Not Currently    Types: Marijuana    Comment: cocaine- last time used- 11/24/2017 , marijuana-    Sexual activity: Not on file  Other Topics Concern   Not on file  Social History Narrative   He quit smoking in 2010. 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 He developed pulmonary problems, became disabled because of lower airway disease in 2009.       WATCHES BASKETBALL. HIS TEAM IS [place].   Social Determinants of Health   Financial Resource Strain: Not on file  Food Insecurity: Not on file  Transportation Needs: Not on file  Physical Activity: Not on file  Stress: Not on file  Social Connections: Not on file   Outpatient Encounter Medications as of 06/16/2021  Medication  Sig   albuterol (VENTOLIN HFA) 108 (90 Base) MCG/ACT inhaler Inhale 2 puffs into the lungs every 6 (six) hours as needed for wheezing or shortness of breath.   amLODipine (NORVASC) 5 MG tablet Take 1 tablet (5 mg total) by mouth daily.   aspirin EC 81 MG tablet Take 81 mg by mouth daily. Swallow whole.   budesonide-formoterol (SYMBICORT) 160-4.5 MCG/ACT inhaler Inhale 2 puffs into the lungs 2 (two) times daily.   calcitRIOL (ROCALTROL) 0.25 MCG  capsule Take 0.25 mcg by mouth daily.   diclofenac (CATAFLAM) 50 MG tablet Take 1 tablet (50 mg total) by mouth 2 (two) times daily.   diclofenac (VOLTAREN) 50 MG EC tablet Take 1 tablet (50 mg total) by mouth 2 (two) times daily.   fluticasone (FLONASE) 50 MCG/ACT nasal spray Place 2 sprays into both nostrils daily.   glucose blood test strip 1 each by Other route 2 (two) times daily. Use as instructed to check blood glucose twice daily   HYDROcodone-acetaminophen (NORCO/VICODIN) 5-325 MG tablet Take 1 tablet by mouth every 6 (six) hours as needed for moderate pain.   Insulin Pen Needle (DROPLET PEN NEEDLES) 31G X 5 MM MISC Use to inject insulin daily as directed   LANTUS SOLOSTAR 100 UNIT/ML Solostar Pen INJECT  30 UNITS SUBCUTANEOUSLY AT BEDTIME   losartan (COZAAR) 100 MG tablet    metoprolol succinate (TOPROL-XL) 25 MG 24 hr tablet Take 25 mg by mouth daily.   mometasone (NASONEX) 50 MCG/ACT nasal spray Place 2 sprays into the nose 2 (two) times daily as needed (allergies).   pantoprazole (PROTONIX) 40 MG tablet TAKE 1 TABLET TWICE DAILY 30 MINUTES PRIOR TO MEALS   sevelamer carbonate (RENVELA) 800 MG tablet    simvastatin (ZOCOR) 20 MG tablet Take 20 mg by mouth every morning.   tiZANidine (ZANAFLEX) 4 MG tablet Take 1 tablet (4 mg total) by mouth every 6 (six) hours as needed for up to 60 doses for muscle spasms.   torsemide (DEMADEX) 100 MG tablet Take 100 mg by mouth daily.   TRADJENTA 5 MG TABS tablet TAKE 1 TABLET EVERY DAY   Vitamin D,  Ergocalciferol, (DRISDOL) 1.25 MG (50000 UNIT) CAPS capsule Take 50,000 Units by mouth every 30 (thirty) days.   No facility-administered encounter medications on file as of 06/16/2021.   ALLERGIES: Allergies  Allergen Reactions   Tramadol Hcl    Opana [Oxymorphone Hcl] Itching   Oxymorphone Itching   Tramadol Itching   VACCINATION STATUS: Immunization History  Administered Date(s) Administered   Influenza, High Dose Seasonal PF 09/12/2018, 04/17/2019, 05/11/2020   Influenza-Unspecified 09/12/2018, 04/01/2019   Moderna Sars-Covid-2 Vaccination 09/18/2019, 10/16/2019, 05/24/2020   PPD Test 07/19/2018, 09/07/2018, 11/18/2019   Pneumococcal Polysaccharide-23 09/13/2018   Td 03/27/2011    Diabetes He presents for his follow-up diabetic visit. He has type 2 diabetes mellitus. Onset time: He was diagnosed approximately 20 years ago. His disease course has been stable. There are no hypoglycemic associated symptoms. Pertinent negatives for hypoglycemia include no confusion, headaches, pallor or seizures. Pertinent negatives for diabetes include no chest pain, no fatigue, no polydipsia, no polyphagia, no polyuria and no weakness. There are no hypoglycemic complications. Symptoms are stable. Diabetic complications include heart disease and nephropathy. (Patient with end-stage renal disease now on hemodialysis.) Risk factors for coronary artery disease include dyslipidemia, diabetes mellitus, hypertension, obesity, sedentary lifestyle, tobacco exposure and male sex. Current diabetic treatment includes insulin injections and oral agent (monotherapy). He is compliant with treatment most of the time. His weight is fluctuating minimally. He is following a generally healthy diet. When asked about meal planning, he reported none. He has had a previous visit with a dietitian. He rarely participates in exercise. His home blood glucose trend is fluctuating minimally. His breakfast blood glucose range is generally  110-130 mg/dl. His overall blood glucose range is 110-130 mg/dl. (He presents today with his meter and logs showing stable fasting glycemic profile.  His POCT A1c today is 6.2%, unchanged from previous  visit.  He is now on HD and does report some low glucose readings during his treatments.  He admits he has been taking Humalog 10 units with meals (previously prescribed by Dr. Dorris Fetch?).  Analysis of his meter shows 7-day average of 138, 14-day average of 122, 30-day average of 119.) An ACE inhibitor/angiotensin II receptor blocker is being taken. He sees a podiatrist.Eye exam is current.  Hypertension This is a chronic problem. The current episode started more than 1 year ago. The problem has been resolved since onset. The problem is controlled. Pertinent negatives include no chest pain, headaches, neck pain, palpitations or shortness of breath. There are no associated agents to hypertension. Risk factors for coronary artery disease include diabetes mellitus, dyslipidemia, obesity, smoking/tobacco exposure, sedentary lifestyle and male gender. Past treatments include angiotensin blockers, calcium channel blockers, diuretics and beta blockers. There are no compliance problems.  Hypertensive end-organ damage includes kidney disease and CAD/MI. Identifiable causes of hypertension include chronic renal disease and sleep apnea.  Hyperlipidemia This is a chronic problem. The current episode started more than 1 year ago. The problem is controlled. Recent lipid tests were reviewed and are normal (LDL normal at 63 triglycerides elevated at 157.). Exacerbating diseases include chronic renal disease, diabetes and obesity. Factors aggravating his hyperlipidemia include fatty foods. Pertinent negatives include no chest pain, myalgias or shortness of breath. Current antihyperlipidemic treatment includes statins. The current treatment provides moderate improvement of lipids. Compliance problems include adherence to diet and  adherence to exercise.  Risk factors for coronary artery disease include diabetes mellitus, dyslipidemia, hypertension, male sex, a sedentary lifestyle and obesity.   Review of systems  Constitutional: + Minimally fluctuating body weight,  current Body mass index is 39.82 kg/m. , no fatigue, no subjective hyperthermia, no subjective hypothermia Eyes: no blurry vision, no xerophthalmia ENT: no sore throat, no nodules palpated in throat, no dysphagia/odynophagia, no hoarseness Cardiovascular: no chest pain, no shortness of breath, no palpitations, no leg swelling Respiratory: no cough, no shortness of breath Gastrointestinal: no nausea/vomiting/diarrhea Musculoskeletal: no muscle/joint aches Skin: no rashes, no hyperemia Neurological: no tremors, no numbness, no tingling, no dizziness Psychiatric: no depression, no anxiety   Objective:    BP 116/77   Pulse 71   Ht 5\' 4"  (1.626 m)   Wt 232 lb (105.2 kg)   BMI 39.82 kg/m   Wt Readings from Last 3 Encounters:  06/16/21 232 lb (105.2 kg)  05/09/21 229 lb 9.6 oz (104.1 kg)  04/25/21 235 lb (106.6 kg)    BP Readings from Last 3 Encounters:  06/16/21 116/77  05/09/21 130/81  03/25/21 (!) 134/92    Physical Exam- Limited  Constitutional:  Body mass index is 39.82 kg/m. , not in acute distress, normal state of mind Eyes:  EOMI, no exophthalmos Neck: Supple Cardiovascular: RRR, no murmurs, rubs, or gallops, no edema Respiratory: Adequate breathing efforts, no crackles, rales, rhonchi, or wheezing Musculoskeletal: no gross deformities, strength intact in all four extremities, no gross restriction of joint movements Skin:  no rashes, no hyperemia Neurological: no tremor with outstretched hands   Diabetic Foot Exam - Simple   Simple Foot Form Diabetic Foot exam was performed with the following findings: Yes 06/16/2021 10:56 AM  Visual Inspection See comments: Yes Sensation Testing Intact to touch and monofilament testing  bilaterally: Yes Pulse Check Posterior Tibialis and Dorsalis pulse intact bilaterally: Yes Comments Dry flaky skin bilaterally, onychomycosis bilaterally      Results for orders placed or performed in visit on  06/16/21  HgB A1c  Result Value Ref Range   Hemoglobin A1C     HbA1c POC (<> result, manual entry)     HbA1c, POC (prediabetic range)     HbA1c, POC (controlled diabetic range) 6.2 0.0 - 7.0 %   Diabetic Labs (most recent): Lab Results  Component Value Date   HGBA1C 6.2 06/16/2021   HGBA1C 6.2 (A) 12/15/2020   HGBA1C 6.1 06/16/2020   Lipid Panel     Component Value Date/Time   CHOL 104 06/10/2021 1337   TRIG 110 06/10/2021 1337   HDL 30 (L) 06/10/2021 1337   CHOLHDL 3.5 06/10/2021 1337   CHOLHDL 2.8 09/06/2018 1018   VLDL 38 (H) 06/16/2016 0956   LDLCALC 53 06/10/2021 1337   [place] 52 09/06/2018 1018     Assessment & Plan:   1) Type 2 diabetes mellitus with stage 4 chronic kidney disease, with long-term current use of insulin (HCC)  -His  Diabetes is complicated by end-stage renal disease recently initiated on hemodialysis,  and patient remains at a high risk for more acute and chronic complications of diabetes which include CAD, CVA, CKD, retinopathy, and neuropathy. These are all discussed in detail with the patient.  He presents today with his meter and logs showing stable fasting glycemic profile.  His POCT A1c today is 6.2%, unchanged from previous visit.  He is now on HD and does report some low glucose readings during his treatments.  He admits he has been taking Humalog 10 units with meals (previously prescribed by Dr. Dorris Fetch?).  Analysis of his meter shows 7-day average of 138, 14-day average of 122, 30-day average of 119.   Recent labs reviewed.  - Nutritional counseling repeated at each appointment due to patients tendency to fall back in to old habits.  - The patient admits there is a room for improvement in their diet and drink choices. -   Suggestion is made for the patient to avoid simple carbohydrates from their diet including Cakes, Sweet Desserts / Pastries, Ice Cream, Soda (diet and regular), Sweet Tea, Candies, Chips, Cookies, Sweet Pastries, Store Bought Juices, Alcohol in Excess of 1-2 drinks a day, Artificial Sweeteners, Coffee Creamer, and "Sugar-free" Products. This will help patient to have stable blood glucose profile and potentially avoid unintended weight gain.   - I encouraged the patient to switch to unprocessed or minimally processed complex starch and increased protein intake (animal or plant source), fruits, and vegetables.   - Patient is advised to stick to a routine mealtimes to eat 3 meals a day and avoid unnecessary snacks (to snack only to correct hypoglycemia).  - I have approached patient with the following individualized plan to manage diabetes and patient agrees.  -He is advised to stay away from alcohol and smoking.  -Based on his stable, at goal glycemic profile, he is advised to continue his Lantus to 30 units SQ nightly and continue Tradjenta 5 mg p.o. daily with breakfast.  I did advise him to stop any short-acting insulin such as Humalog to avoid hypoglycemia.  -He is encouraged to continue monitoring blood glucose twice daily, before breakfast and before bed, and to call the clinic if he has readings less than 70 or greater than 200 for 3 tests in a row.  He is following with Jearld Fenton, CDE for DM education.   -He will continue to follow-up with nephrology given stage 5 CKD on PD.  -he is not a candidate for metformin and SGLT2 inhibitors due to  CKD.   - Patient specific target  for A1c; LDL, HDL, Triglycerides, were discussed in detail.  2) BP/HTN:  His blood pressure is controlled to target. He is advised to continue Norvasc 5 mg po daily, Metoprolol 25 mg po daily, Demadex 100 mg po daily, and Losartan 100 mg po daily.    3) Lipids/HPL:  His recent lipid panel from 06/10/21 shows  LDL stable at 53.  He is advised to continue Simvastatin 20 mg p.o. nightly.    4)  Weight/Diet: His Body mass index is 39.82 kg/m.- clearly complicating his DM care. He is a candidate for modest weight loss.   CDE consult in progress, exercise, and carbohydrates information provided.  5) Chronic Care/Health Maintenance: -Patient is on ARB and Statin medications and encouraged to continue to follow up with Ophthalmology, nephrology (he is status post fistula placement in prep for hemodialysis) Podiatrist at least yearly or according to recommendations, and advised to quit smoking. I have recommended yearly flu vaccine and pneumonia vaccination at least every 5 years; moderate intensity exercise for up to 150 minutes weekly; and  sleep for at least 7 hours a day.  - I advised patient to maintain close follow up with his PCP for primary care needs.         I spent 41 minutes in the care of the patient today including review of labs from [place], Lipids, Thyroid Function, Hematology (current and previous including abstractions from other facilities); face-to-face time discussing  his blood glucose readings/logs, discussing hypoglycemia and hyperglycemia episodes and symptoms, medications doses, his options of short and long term treatment based on the latest standards of care / guidelines;  discussion about incorporating lifestyle medicine;  and documenting the encounter.    Please refer to Patient Instructions for Blood Glucose Monitoring and Insulin/Medications Dosing Guide"  in media tab for additional information. Please  also refer to " Patient Self Inventory" in the Media  tab for reviewed elements of pertinent patient history.  [name] [name] participated in the discussions, expressed understanding, and voiced agreement with the above plans.  All questions were answered to his satisfaction. he is encouraged to contact clinic should he have any questions or concerns prior to his return  visit.    Follow up plan: -Return in about 6 months (around 12/15/2021) for Diabetes F/U with A1c in office, No previsit labs, Bring meter and logs.     Rayetta Pigg, [facility] [address] [place], [place] [address] Phone: [phone] Fax: [phone]   06/16/2021, 11:07 AM

## 2021-06-17 DIAGNOSIS — D509 Iron deficiency anemia, unspecified: Secondary | ICD-10-CM | POA: Diagnosis not present

## 2021-06-17 DIAGNOSIS — Z992 Dependence on renal dialysis: Secondary | ICD-10-CM | POA: Diagnosis not present

## 2021-06-17 DIAGNOSIS — N186 End stage renal disease: Secondary | ICD-10-CM | POA: Diagnosis not present

## 2021-06-17 DIAGNOSIS — N2581 Secondary hyperparathyroidism of renal origin: Secondary | ICD-10-CM | POA: Diagnosis not present

## 2021-06-17 DIAGNOSIS — D631 Anemia in chronic kidney disease: Secondary | ICD-10-CM | POA: Diagnosis not present

## 2021-06-18 DIAGNOSIS — D509 Iron deficiency anemia, unspecified: Secondary | ICD-10-CM | POA: Diagnosis not present

## 2021-06-18 DIAGNOSIS — N2581 Secondary hyperparathyroidism of renal origin: Secondary | ICD-10-CM | POA: Diagnosis not present

## 2021-06-18 DIAGNOSIS — N186 End stage renal disease: Secondary | ICD-10-CM | POA: Diagnosis not present

## 2021-06-18 DIAGNOSIS — Z992 Dependence on renal dialysis: Secondary | ICD-10-CM | POA: Diagnosis not present

## 2021-06-18 DIAGNOSIS — D631 Anemia in chronic kidney disease: Secondary | ICD-10-CM | POA: Diagnosis not present

## 2021-06-19 DIAGNOSIS — D509 Iron deficiency anemia, unspecified: Secondary | ICD-10-CM | POA: Diagnosis not present

## 2021-06-19 DIAGNOSIS — N186 End stage renal disease: Secondary | ICD-10-CM | POA: Diagnosis not present

## 2021-06-19 DIAGNOSIS — Z992 Dependence on renal dialysis: Secondary | ICD-10-CM | POA: Diagnosis not present

## 2021-06-19 DIAGNOSIS — N2581 Secondary hyperparathyroidism of renal origin: Secondary | ICD-10-CM | POA: Diagnosis not present

## 2021-06-19 DIAGNOSIS — D631 Anemia in chronic kidney disease: Secondary | ICD-10-CM | POA: Diagnosis not present

## 2021-06-20 DIAGNOSIS — Z992 Dependence on renal dialysis: Secondary | ICD-10-CM | POA: Diagnosis not present

## 2021-06-20 DIAGNOSIS — D631 Anemia in chronic kidney disease: Secondary | ICD-10-CM | POA: Diagnosis not present

## 2021-06-20 DIAGNOSIS — N186 End stage renal disease: Secondary | ICD-10-CM | POA: Diagnosis not present

## 2021-06-20 DIAGNOSIS — D509 Iron deficiency anemia, unspecified: Secondary | ICD-10-CM | POA: Diagnosis not present

## 2021-06-20 DIAGNOSIS — N2581 Secondary hyperparathyroidism of renal origin: Secondary | ICD-10-CM | POA: Diagnosis not present

## 2021-06-21 ENCOUNTER — Other Ambulatory Visit: Payer: Self-pay

## 2021-06-21 ENCOUNTER — Ambulatory Visit (INDEPENDENT_AMBULATORY_CARE_PROVIDER_SITE_OTHER): Payer: Medicare Other | Admitting: Pulmonary Disease

## 2021-06-21 ENCOUNTER — Telehealth: Payer: Self-pay

## 2021-06-21 ENCOUNTER — Encounter: Payer: Self-pay | Admitting: Pulmonary Disease

## 2021-06-21 DIAGNOSIS — N2581 Secondary hyperparathyroidism of renal origin: Secondary | ICD-10-CM | POA: Diagnosis not present

## 2021-06-21 DIAGNOSIS — Z9989 Dependence on other enabling machines and devices: Secondary | ICD-10-CM

## 2021-06-21 DIAGNOSIS — I2510 Atherosclerotic heart disease of native coronary artery without angina pectoris: Secondary | ICD-10-CM | POA: Diagnosis not present

## 2021-06-21 DIAGNOSIS — J4540 Moderate persistent asthma, uncomplicated: Secondary | ICD-10-CM

## 2021-06-21 DIAGNOSIS — Z992 Dependence on renal dialysis: Secondary | ICD-10-CM | POA: Diagnosis not present

## 2021-06-21 DIAGNOSIS — G4733 Obstructive sleep apnea (adult) (pediatric): Secondary | ICD-10-CM

## 2021-06-21 DIAGNOSIS — N186 End stage renal disease: Secondary | ICD-10-CM | POA: Diagnosis not present

## 2021-06-21 DIAGNOSIS — D509 Iron deficiency anemia, unspecified: Secondary | ICD-10-CM | POA: Diagnosis not present

## 2021-06-21 DIAGNOSIS — D631 Anemia in chronic kidney disease: Secondary | ICD-10-CM | POA: Diagnosis not present

## 2021-06-21 MED ORDER — PREDNISONE 10 MG PO TABS: ORAL_TABLET | ORAL | 0 refills | Status: DC

## 2021-06-21 MED ORDER — PREDNISONE 10 MG PO TABS: ORAL_TABLET | ORAL | 0 refills | Status: AC

## 2021-06-21 NOTE — Telephone Encounter (Signed)
Called and spoke to [place] office in [place] with number [phone] in regards to delay in patient receiving CPAP machine. Representative stated that she was going to go ahead and verify patients insurance and try to reach out to patient and have him set up with his CPAP this week or in the beginning of next week. Called to notify patient but did not get an answer. Left a voicemail for patient notifying him of what the rep from [place] said and to call us back with any questions.

## 2021-06-21 NOTE — Assessment & Plan Note (Signed)
Prescription for auto CPAP has been sent to DME. We will review download once he gets his machine and tweak settings as needed. Will be curious to see if his cough resolves  Weight loss encouraged, compliance with goal of at least 4-6 hrs every night is the expectation. Advised against medications with sedative side effects Cautioned against driving when sleepy - understanding that sleepiness will vary on a day to day basis

## 2021-06-21 NOTE — Patient Instructions (Signed)
   Prednisone 10 mg tabs  Take 2 tabs daily with food x 5ds, then 1 tab daily with food x 5ds then STOP  Continue on symbicort  Take protonix in the evening  HOB elevated  We will check with [facility] about your CPAP Rx

## 2021-06-21 NOTE — Assessment & Plan Note (Signed)
Continue on Symbicort, refills will be provided. Use albuterol as needed. We will recheck spirometry in the future

## 2021-06-21 NOTE — Progress Notes (Signed)
   Subjective:    Patient ID: [name] [name], male    DOB: 05-Feb-1951, 70 y.o.   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  He smoked about 25 pack years before he quit in 2011 started on oxygen with exertion 10 years ago, wears as needed  Chief Complaint  Patient presents with   Follow-up    Feels asthma has been "up and down since last visit" He is wheezing, coughing and more SOB than usual.   Has not received CPAP machine.    He continues on PD exchanges. He has still not received his new CPAP which is on backorder for [facility].  He is using his old machine.  He reports persistent cough especially at night, he sleeps with his head of bed elevated. He denies obvious heartburn or postnasal drip Prednisone helped him in the past, otherwise he denies wheezing, compliant with Symbicort  Significant tests/ events reviewed  08/2020 HST >> AHI 21/h   PFTs 12/2019 ratio 74 FEV1 of 3 6 L (57% predicted), normal (pseudonormal) lung volumes, normal diffusion capacity.  Review of Systems neg for any significant sore throat, dysphagia, itching, sneezing, nasal congestion or excess/ purulent secretions, fever, chills, sweats, unintended wt loss, pleuritic or exertional cp, hempoptysis, orthopnea pnd or change in chronic leg swelling. Also denies presyncope, palpitations, heartburn, abdominal pain, nausea, vomiting, diarrhea or change in bowel or urinary habits, dysuria,hematuria, rash, arthralgias, visual complaints, headache, numbness weakness or ataxia.     Objective:   Physical Exam  Gen. Pleasant, obese, in no distress ENT - no lesions, no post nasal drip Neck: No JVD, no thyromegaly, no carotid bruits Lungs: no use of accessory  muscles, no dullness to percussion, decreased without rales or rhonchi  Cardiovascular: Rhythm regular, heart sounds  normal, no murmurs or gallops, no peripheral edema Musculoskeletal: No deformities, no cyanosis or clubbing , no tremors, left arm AV fistula       Assessment & Plan:    Chronic cough -unclear cause but mainly nocturnal, on no obvious heartburn or postnasal drip.?  Related to PD exchanges, doubt related to CPAP. To be determined  I took a detailed environmental history of his bedroom, no obvious triggers

## 2021-06-22 DIAGNOSIS — N2581 Secondary hyperparathyroidism of renal origin: Secondary | ICD-10-CM | POA: Diagnosis not present

## 2021-06-22 DIAGNOSIS — D509 Iron deficiency anemia, unspecified: Secondary | ICD-10-CM | POA: Diagnosis not present

## 2021-06-22 DIAGNOSIS — D631 Anemia in chronic kidney disease: Secondary | ICD-10-CM | POA: Diagnosis not present

## 2021-06-22 DIAGNOSIS — N186 End stage renal disease: Secondary | ICD-10-CM | POA: Diagnosis not present

## 2021-06-22 DIAGNOSIS — Z992 Dependence on renal dialysis: Secondary | ICD-10-CM | POA: Diagnosis not present

## 2021-06-23 DIAGNOSIS — D631 Anemia in chronic kidney disease: Secondary | ICD-10-CM | POA: Diagnosis not present

## 2021-06-23 DIAGNOSIS — D509 Iron deficiency anemia, unspecified: Secondary | ICD-10-CM | POA: Diagnosis not present

## 2021-06-23 DIAGNOSIS — N2581 Secondary hyperparathyroidism of renal origin: Secondary | ICD-10-CM | POA: Diagnosis not present

## 2021-06-23 DIAGNOSIS — N186 End stage renal disease: Secondary | ICD-10-CM | POA: Diagnosis not present

## 2021-06-23 DIAGNOSIS — Z992 Dependence on renal dialysis: Secondary | ICD-10-CM | POA: Diagnosis not present

## 2021-06-24 DIAGNOSIS — Z992 Dependence on renal dialysis: Secondary | ICD-10-CM | POA: Diagnosis not present

## 2021-06-24 DIAGNOSIS — D631 Anemia in chronic kidney disease: Secondary | ICD-10-CM | POA: Diagnosis not present

## 2021-06-24 DIAGNOSIS — N186 End stage renal disease: Secondary | ICD-10-CM | POA: Diagnosis not present

## 2021-06-24 DIAGNOSIS — N2581 Secondary hyperparathyroidism of renal origin: Secondary | ICD-10-CM | POA: Diagnosis not present

## 2021-06-24 DIAGNOSIS — D509 Iron deficiency anemia, unspecified: Secondary | ICD-10-CM | POA: Diagnosis not present

## 2021-06-25 DIAGNOSIS — N2581 Secondary hyperparathyroidism of renal origin: Secondary | ICD-10-CM | POA: Diagnosis not present

## 2021-06-25 DIAGNOSIS — D631 Anemia in chronic kidney disease: Secondary | ICD-10-CM | POA: Diagnosis not present

## 2021-06-25 DIAGNOSIS — D509 Iron deficiency anemia, unspecified: Secondary | ICD-10-CM | POA: Diagnosis not present

## 2021-06-25 DIAGNOSIS — Z992 Dependence on renal dialysis: Secondary | ICD-10-CM | POA: Diagnosis not present

## 2021-06-25 DIAGNOSIS — N186 End stage renal disease: Secondary | ICD-10-CM | POA: Diagnosis not present

## 2021-06-26 DIAGNOSIS — N2581 Secondary hyperparathyroidism of renal origin: Secondary | ICD-10-CM | POA: Diagnosis not present

## 2021-06-26 DIAGNOSIS — N186 End stage renal disease: Secondary | ICD-10-CM | POA: Diagnosis not present

## 2021-06-26 DIAGNOSIS — D509 Iron deficiency anemia, unspecified: Secondary | ICD-10-CM | POA: Diagnosis not present

## 2021-06-26 DIAGNOSIS — Z992 Dependence on renal dialysis: Secondary | ICD-10-CM | POA: Diagnosis not present

## 2021-06-26 DIAGNOSIS — D631 Anemia in chronic kidney disease: Secondary | ICD-10-CM | POA: Diagnosis not present

## 2021-06-27 DIAGNOSIS — N186 End stage renal disease: Secondary | ICD-10-CM | POA: Diagnosis not present

## 2021-06-27 DIAGNOSIS — L738 Other specified follicular disorders: Secondary | ICD-10-CM | POA: Diagnosis not present

## 2021-06-27 DIAGNOSIS — D631 Anemia in chronic kidney disease: Secondary | ICD-10-CM | POA: Diagnosis not present

## 2021-06-27 DIAGNOSIS — D509 Iron deficiency anemia, unspecified: Secondary | ICD-10-CM | POA: Diagnosis not present

## 2021-06-27 DIAGNOSIS — Z992 Dependence on renal dialysis: Secondary | ICD-10-CM | POA: Diagnosis not present

## 2021-06-27 DIAGNOSIS — N2581 Secondary hyperparathyroidism of renal origin: Secondary | ICD-10-CM | POA: Diagnosis not present

## 2021-06-28 DIAGNOSIS — N2581 Secondary hyperparathyroidism of renal origin: Secondary | ICD-10-CM | POA: Diagnosis not present

## 2021-06-28 DIAGNOSIS — Z992 Dependence on renal dialysis: Secondary | ICD-10-CM | POA: Diagnosis not present

## 2021-06-28 DIAGNOSIS — N186 End stage renal disease: Secondary | ICD-10-CM | POA: Diagnosis not present

## 2021-06-28 DIAGNOSIS — D509 Iron deficiency anemia, unspecified: Secondary | ICD-10-CM | POA: Diagnosis not present

## 2021-06-28 DIAGNOSIS — D631 Anemia in chronic kidney disease: Secondary | ICD-10-CM | POA: Diagnosis not present

## 2021-06-29 DIAGNOSIS — N186 End stage renal disease: Secondary | ICD-10-CM | POA: Diagnosis not present

## 2021-06-29 DIAGNOSIS — Z992 Dependence on renal dialysis: Secondary | ICD-10-CM | POA: Diagnosis not present

## 2021-06-29 DIAGNOSIS — D509 Iron deficiency anemia, unspecified: Secondary | ICD-10-CM | POA: Diagnosis not present

## 2021-06-29 DIAGNOSIS — N2581 Secondary hyperparathyroidism of renal origin: Secondary | ICD-10-CM | POA: Diagnosis not present

## 2021-06-29 DIAGNOSIS — D631 Anemia in chronic kidney disease: Secondary | ICD-10-CM | POA: Diagnosis not present

## 2021-06-30 DIAGNOSIS — D631 Anemia in chronic kidney disease: Secondary | ICD-10-CM | POA: Diagnosis not present

## 2021-06-30 DIAGNOSIS — N186 End stage renal disease: Secondary | ICD-10-CM | POA: Diagnosis not present

## 2021-06-30 DIAGNOSIS — Z992 Dependence on renal dialysis: Secondary | ICD-10-CM | POA: Diagnosis not present

## 2021-06-30 DIAGNOSIS — D509 Iron deficiency anemia, unspecified: Secondary | ICD-10-CM | POA: Diagnosis not present

## 2021-06-30 DIAGNOSIS — N2581 Secondary hyperparathyroidism of renal origin: Secondary | ICD-10-CM | POA: Diagnosis not present

## 2021-07-01 DIAGNOSIS — D509 Iron deficiency anemia, unspecified: Secondary | ICD-10-CM | POA: Diagnosis not present

## 2021-07-01 DIAGNOSIS — N2581 Secondary hyperparathyroidism of renal origin: Secondary | ICD-10-CM | POA: Diagnosis not present

## 2021-07-01 DIAGNOSIS — D631 Anemia in chronic kidney disease: Secondary | ICD-10-CM | POA: Diagnosis not present

## 2021-07-01 DIAGNOSIS — N186 End stage renal disease: Secondary | ICD-10-CM | POA: Diagnosis not present

## 2021-07-01 DIAGNOSIS — Z992 Dependence on renal dialysis: Secondary | ICD-10-CM | POA: Diagnosis not present

## 2021-07-02 DIAGNOSIS — D631 Anemia in chronic kidney disease: Secondary | ICD-10-CM | POA: Diagnosis not present

## 2021-07-02 DIAGNOSIS — N186 End stage renal disease: Secondary | ICD-10-CM | POA: Diagnosis not present

## 2021-07-02 DIAGNOSIS — D509 Iron deficiency anemia, unspecified: Secondary | ICD-10-CM | POA: Diagnosis not present

## 2021-07-02 DIAGNOSIS — N2581 Secondary hyperparathyroidism of renal origin: Secondary | ICD-10-CM | POA: Diagnosis not present

## 2021-07-02 DIAGNOSIS — Z992 Dependence on renal dialysis: Secondary | ICD-10-CM | POA: Diagnosis not present

## 2021-07-03 DIAGNOSIS — N2581 Secondary hyperparathyroidism of renal origin: Secondary | ICD-10-CM | POA: Diagnosis not present

## 2021-07-03 DIAGNOSIS — N186 End stage renal disease: Secondary | ICD-10-CM | POA: Diagnosis not present

## 2021-07-03 DIAGNOSIS — Z992 Dependence on renal dialysis: Secondary | ICD-10-CM | POA: Diagnosis not present

## 2021-07-03 DIAGNOSIS — D631 Anemia in chronic kidney disease: Secondary | ICD-10-CM | POA: Diagnosis not present

## 2021-07-03 DIAGNOSIS — D509 Iron deficiency anemia, unspecified: Secondary | ICD-10-CM | POA: Diagnosis not present

## 2021-07-04 DIAGNOSIS — D631 Anemia in chronic kidney disease: Secondary | ICD-10-CM | POA: Diagnosis not present

## 2021-07-04 DIAGNOSIS — N186 End stage renal disease: Secondary | ICD-10-CM | POA: Diagnosis not present

## 2021-07-04 DIAGNOSIS — Z992 Dependence on renal dialysis: Secondary | ICD-10-CM | POA: Diagnosis not present

## 2021-07-04 DIAGNOSIS — N2581 Secondary hyperparathyroidism of renal origin: Secondary | ICD-10-CM | POA: Diagnosis not present

## 2021-07-04 DIAGNOSIS — D509 Iron deficiency anemia, unspecified: Secondary | ICD-10-CM | POA: Diagnosis not present

## 2021-07-05 DIAGNOSIS — B351 Tinea unguium: Secondary | ICD-10-CM | POA: Diagnosis not present

## 2021-07-05 DIAGNOSIS — D509 Iron deficiency anemia, unspecified: Secondary | ICD-10-CM | POA: Diagnosis not present

## 2021-07-05 DIAGNOSIS — E1142 Type 2 diabetes mellitus with diabetic polyneuropathy: Secondary | ICD-10-CM | POA: Diagnosis not present

## 2021-07-05 DIAGNOSIS — D631 Anemia in chronic kidney disease: Secondary | ICD-10-CM | POA: Diagnosis not present

## 2021-07-05 DIAGNOSIS — L600 Ingrowing nail: Secondary | ICD-10-CM | POA: Diagnosis not present

## 2021-07-05 DIAGNOSIS — N2581 Secondary hyperparathyroidism of renal origin: Secondary | ICD-10-CM | POA: Diagnosis not present

## 2021-07-05 DIAGNOSIS — Z992 Dependence on renal dialysis: Secondary | ICD-10-CM | POA: Diagnosis not present

## 2021-07-05 DIAGNOSIS — N186 End stage renal disease: Secondary | ICD-10-CM | POA: Diagnosis not present

## 2021-07-06 DIAGNOSIS — N2581 Secondary hyperparathyroidism of renal origin: Secondary | ICD-10-CM | POA: Diagnosis not present

## 2021-07-06 DIAGNOSIS — Z992 Dependence on renal dialysis: Secondary | ICD-10-CM | POA: Diagnosis not present

## 2021-07-06 DIAGNOSIS — D509 Iron deficiency anemia, unspecified: Secondary | ICD-10-CM | POA: Diagnosis not present

## 2021-07-06 DIAGNOSIS — N186 End stage renal disease: Secondary | ICD-10-CM | POA: Diagnosis not present

## 2021-07-06 DIAGNOSIS — D631 Anemia in chronic kidney disease: Secondary | ICD-10-CM | POA: Diagnosis not present

## 2021-07-07 DIAGNOSIS — N186 End stage renal disease: Secondary | ICD-10-CM | POA: Diagnosis not present

## 2021-07-07 DIAGNOSIS — Z992 Dependence on renal dialysis: Secondary | ICD-10-CM | POA: Diagnosis not present

## 2021-07-07 DIAGNOSIS — D631 Anemia in chronic kidney disease: Secondary | ICD-10-CM | POA: Diagnosis not present

## 2021-07-07 DIAGNOSIS — N2581 Secondary hyperparathyroidism of renal origin: Secondary | ICD-10-CM | POA: Diagnosis not present

## 2021-07-07 DIAGNOSIS — D509 Iron deficiency anemia, unspecified: Secondary | ICD-10-CM | POA: Diagnosis not present

## 2021-07-08 DIAGNOSIS — Z992 Dependence on renal dialysis: Secondary | ICD-10-CM | POA: Diagnosis not present

## 2021-07-08 DIAGNOSIS — N186 End stage renal disease: Secondary | ICD-10-CM | POA: Diagnosis not present

## 2021-07-08 DIAGNOSIS — D631 Anemia in chronic kidney disease: Secondary | ICD-10-CM | POA: Diagnosis not present

## 2021-07-08 DIAGNOSIS — D509 Iron deficiency anemia, unspecified: Secondary | ICD-10-CM | POA: Diagnosis not present

## 2021-07-08 DIAGNOSIS — N2581 Secondary hyperparathyroidism of renal origin: Secondary | ICD-10-CM | POA: Diagnosis not present

## 2021-07-09 DIAGNOSIS — D631 Anemia in chronic kidney disease: Secondary | ICD-10-CM | POA: Diagnosis not present

## 2021-07-09 DIAGNOSIS — Z992 Dependence on renal dialysis: Secondary | ICD-10-CM | POA: Diagnosis not present

## 2021-07-09 DIAGNOSIS — N2581 Secondary hyperparathyroidism of renal origin: Secondary | ICD-10-CM | POA: Diagnosis not present

## 2021-07-09 DIAGNOSIS — D509 Iron deficiency anemia, unspecified: Secondary | ICD-10-CM | POA: Diagnosis not present

## 2021-07-09 DIAGNOSIS — N186 End stage renal disease: Secondary | ICD-10-CM | POA: Diagnosis not present

## 2021-07-10 DIAGNOSIS — N2581 Secondary hyperparathyroidism of renal origin: Secondary | ICD-10-CM | POA: Diagnosis not present

## 2021-07-10 DIAGNOSIS — N186 End stage renal disease: Secondary | ICD-10-CM | POA: Diagnosis not present

## 2021-07-10 DIAGNOSIS — D631 Anemia in chronic kidney disease: Secondary | ICD-10-CM | POA: Diagnosis not present

## 2021-07-10 DIAGNOSIS — Z992 Dependence on renal dialysis: Secondary | ICD-10-CM | POA: Diagnosis not present

## 2021-07-10 DIAGNOSIS — D509 Iron deficiency anemia, unspecified: Secondary | ICD-10-CM | POA: Diagnosis not present

## 2021-07-11 DIAGNOSIS — N2581 Secondary hyperparathyroidism of renal origin: Secondary | ICD-10-CM | POA: Diagnosis not present

## 2021-07-11 DIAGNOSIS — N186 End stage renal disease: Secondary | ICD-10-CM | POA: Diagnosis not present

## 2021-07-11 DIAGNOSIS — D509 Iron deficiency anemia, unspecified: Secondary | ICD-10-CM | POA: Diagnosis not present

## 2021-07-11 DIAGNOSIS — Z992 Dependence on renal dialysis: Secondary | ICD-10-CM | POA: Diagnosis not present

## 2021-07-11 DIAGNOSIS — D631 Anemia in chronic kidney disease: Secondary | ICD-10-CM | POA: Diagnosis not present

## 2021-07-12 DIAGNOSIS — N186 End stage renal disease: Secondary | ICD-10-CM | POA: Diagnosis not present

## 2021-07-12 DIAGNOSIS — Z992 Dependence on renal dialysis: Secondary | ICD-10-CM | POA: Diagnosis not present

## 2021-07-12 DIAGNOSIS — D631 Anemia in chronic kidney disease: Secondary | ICD-10-CM | POA: Diagnosis not present

## 2021-07-12 DIAGNOSIS — D509 Iron deficiency anemia, unspecified: Secondary | ICD-10-CM | POA: Diagnosis not present

## 2021-07-12 DIAGNOSIS — N2581 Secondary hyperparathyroidism of renal origin: Secondary | ICD-10-CM | POA: Diagnosis not present

## 2021-07-13 ENCOUNTER — Ambulatory Visit (INDEPENDENT_AMBULATORY_CARE_PROVIDER_SITE_OTHER): Payer: Medicare Other | Admitting: Gastroenterology

## 2021-07-13 ENCOUNTER — Encounter: Payer: Self-pay | Admitting: Gastroenterology

## 2021-07-13 ENCOUNTER — Other Ambulatory Visit: Payer: Self-pay

## 2021-07-13 DIAGNOSIS — D631 Anemia in chronic kidney disease: Secondary | ICD-10-CM | POA: Diagnosis not present

## 2021-07-13 DIAGNOSIS — K625 Hemorrhage of anus and rectum: Secondary | ICD-10-CM

## 2021-07-13 DIAGNOSIS — N2581 Secondary hyperparathyroidism of renal origin: Secondary | ICD-10-CM | POA: Diagnosis not present

## 2021-07-13 DIAGNOSIS — Z992 Dependence on renal dialysis: Secondary | ICD-10-CM | POA: Diagnosis not present

## 2021-07-13 DIAGNOSIS — I2510 Atherosclerotic heart disease of native coronary artery without angina pectoris: Secondary | ICD-10-CM | POA: Diagnosis not present

## 2021-07-13 DIAGNOSIS — N186 End stage renal disease: Secondary | ICD-10-CM | POA: Diagnosis not present

## 2021-07-13 DIAGNOSIS — D509 Iron deficiency anemia, unspecified: Secondary | ICD-10-CM | POA: Diagnosis not present

## 2021-07-13 MED ORDER — HYDROCORTISONE (PERIANAL) 2.5 % EX CREA: 1.0000 "application " | TOPICAL_CREAM | Freq: Two times a day (BID) | CUTANEOUS | 1 refills | Status: DC

## 2021-07-13 NOTE — Progress Notes (Signed)
Referring Provider: [name], MD Primary Care Physician:  [name], MD Primary GI: Dr. Abbey Chatters  Chief Complaint  Patient presents with   Hemorrhoids    Had some problems last week, better now. Has some bleeding when wiped    HPI:   [name] [name] is a 70 y.o. male presenting today with a history of chronic GERD, recently seen in Aug 2022. Due to flare in hemorrhoids, he has arranged an early interval appt.   Last colonoscopy in 2016 with hyperplastic polyps. Noted burning and itching last week. Saw blood with wiping on tissue. Nothing in toilet. No pain with BM. No swelling. No straining. No constipation. Prolonged toilet time. No hard stools. BM 3-4 times per day but soft. Feels much better. Used preparation H.   Past Medical History:  Diagnosis Date   Anemia    Arthritis    Asthma    Bell palsy    CAD (coronary artery disease)    a. 2014 MV: abnl w/ infap ischemia; b. 03/2013 Cath: aneurysmal bleb in the LAD w/ otw nonobs dzs-->Med Rx.   Chronic back pain    Chronic knee pain    a. 09/2015 s/p R TKA.   Chronic pain    Chronic shoulder pain    Chronic sinusitis    CKD (chronic kidney disease), stage IV ([place])    stage 4 per office visit note of Dr Lowanda Foster on 05/2015    COPD (chronic obstructive pulmonary disease) ([place])    Diabetes mellitus without complication ([place])    type II    Essential hypertension    GERD (gastroesophageal reflux disease)    Gout    Gout    Hepatomegaly    noted on noncontrast CT 2015   History of hiatal hernia    Hyperlipidemia    Lateral meniscus tear    Obesity    Truncal   Obstructive sleep apnea    does not use cpap    On home oxygen therapy    uses 2l when is going somewhere per patient    PUD (peptic ulcer disease)    remote, reports f/u EGD about 8 years ago unremarkable    Reactive airway disease    related to exposure to chemical during 9/11   Renal insufficiency    Sinusitis    Vitamin D deficiency      Past Surgical History:  Procedure Laterality Date   ASAD LT SHOULDER  12/15/2008   left shoulder   AV FISTULA PLACEMENT Left 08/09/2016   Procedure: BRACHIOCEPHALIC ARTERIOVENOUS (AV) FISTULA CREATION LEFT ARM;  Surgeon: [name], MD;  Location: [place];  Service: Vascular;  Laterality: Left;   CAPD INSERTION N/A 10/07/2018   Procedure: LAPAROSCOPIC PERITONEAL CATHETER PLACEMENT;  Surgeon: Mickeal Skinner, MD;  Location[facility];  Service: General;  Laterality: N/A;   CATARACT EXTRACTION W/PHACO Left 03/28/2016   Procedure: CATARACT EXTRACTION PHACO AND INTRAOCULAR LENS PLACEMENT LEFT EYE;  Surgeon: [name], MD;  Location: [facility];  Service: Ophthalmology;  Laterality: Left;  CDE: 4.77   CATARACT EXTRACTION W/PHACO Right 04/11/2016   Procedure: CATARACT EXTRACTION PHACO AND INTRAOCULAR LENS PLACEMENT RIGHT EYE; CDE:  4.74;  Surgeon: [name], MD;  Location: [facility];  Service: Ophthalmology;  Laterality: Right;   COLONOSCOPY  10/15/2008   Fields: Rectal polyp obliterated, not retrieved, hemorrhoids, single ascending colon diverticulum near the CV. Next colonoscopy April 2020   COLONOSCOPY N/A 12/25/2014   SLF: 1.  Colorectal polyps (2) removed 2. Small internal hemorrhoids 3. the left colon is severely redundant. hyperplastic polyps   DOPPLER ECHOCARDIOGRAPHY     ESOPHAGOGASTRODUODENOSCOPY N/A 12/25/2014   SLF: 1. Anemia most likely due to CRI, gastritis, gastric polyps 2. Moderate non-erosive gastriits and mild duodenitis.  3.TWo large gstric polyps removed.    EYE SURGERY  12/22/2010   tear duct probing-[place]   FOREIGN BODY REMOVAL  03/29/2011   Procedure: REMOVAL FOREIGN BODY EXTREMITY;  Surgeon: Arther Abbott, MD;  Location: [facility];  Service: Orthopedics;  Laterality: Right;  Removal Foreign Body Right Thumb   IR FLUORO GUIDE CV LINE RIGHT  08/06/2018   IR US GUIDE VASC ACCESS RIGHT  08/06/2018   KNEE ARTHROSCOPY  10/16/2007   left   KNEE ARTHROSCOPY WITH  LATERAL MENISECTOMY Right 10/14/2015   Procedure: LEFT KNEE ARTHROSCOPY WITH PARTIAL LATERAL MENISECTOMY;  Surgeon: Carole Civil, MD;  [facility];  Service: Orthopedics;  Laterality: Right;   LEFT HEART CATHETERIZATION WITH CORONARY ANGIOGRAM N/A 03/28/2013   Procedure: LEFT HEART CATHETERIZATION WITH CORONARY ANGIOGRAM;  Surgeon: Leonie Man, MD;  Location: [facility] CATH LAB;  Service: Cardiovascular;  Laterality: N/A;   NM MYOVIEW LTD     PENILE PROSTHESIS IMPLANT N/A 08/16/2015   Procedure: PENILE PROTHESIS INFLATABLE, three piece, Excisional biopsy of Penile ulcer, Penile molding;  Surgeon: Carolan Clines, MD;  [facility];  Service: Urology;  Laterality: N/A;   PENILE PROSTHESIS IMPLANT N/A 12/24/2017   Procedure: REMOVAL AND  REPLACEMENT  COLOPLAST PENILE PROSTHESIS;  Surgeon: Lucas Mallow, MD;  Location[facility];  Service: Urology;  Laterality: N/A;   QUADRICEPS TENDON REPAIR  07/21/2011   Procedure: REPAIR QUADRICEP TENDON;  Surgeon: Arther Abbott, MD;  Location: [facility];  Service: Orthopedics;  Laterality: Right;   TOENAIL EXCISION     removed [id number]   UMBILICAL HERNIA REPAIR  07/17/2005   [place]    Current Outpatient Medications  Medication Sig Dispense Refill   albuterol (VENTOLIN HFA) 108 (90 Base) MCG/ACT inhaler Inhale 2 puffs into the lungs every 6 (six) hours as needed for wheezing or shortness of breath. 18 g 11   amLODipine (NORVASC) 5 MG tablet Take 1 tablet (5 mg total) by mouth daily. 90 tablet 3   aspirin EC 81 MG tablet Take 81 mg by mouth daily. Swallow whole.     budesonide-formoterol (SYMBICORT) 160-4.5 MCG/ACT inhaler Inhale 2 puffs into the lungs 2 (two) times daily. 1 each 6   calcitRIOL (ROCALTROL) 0.25 MCG capsule Take 0.25 mcg by mouth daily.     diclofenac (CATAFLAM) 50 MG tablet Take 1 tablet (50 mg total) by mouth 2 (two) times daily. 60 tablet 3   diclofenac (VOLTAREN) 50 MG EC tablet Take 1 tablet (50 mg total) by mouth 2  (two) times daily. 60 tablet 2   fluticasone (FLONASE) 50 MCG/ACT nasal spray Place 2 sprays into both nostrils daily. 16 g 0   glucose blood test strip 1 each by Other route 2 (two) times daily. Use as instructed to check blood glucose twice daily 200 each 2   Insulin Pen Needle (DROPLET PEN NEEDLES) 31G X 5 MM MISC Use to inject insulin daily as directed 150 each 2   LANTUS SOLOSTAR 100 UNIT/ML Solostar Pen INJECT  30 UNITS SUBCUTANEOUSLY AT BEDTIME 30 mL 1   metoprolol succinate (TOPROL-XL) 25 MG 24 hr tablet Take 25 mg by mouth daily.     mometasone (NASONEX) 50 MCG/ACT nasal spray  Place 2 sprays into the nose 2 (two) times daily as needed (allergies).     pantoprazole (PROTONIX) 40 MG tablet TAKE 1 TABLET TWICE DAILY 30 MINUTES PRIOR TO MEALS 180 tablet 1   simvastatin (ZOCOR) 20 MG tablet Take 20 mg by mouth every morning.     torsemide (DEMADEX) 100 MG tablet Take 100 mg by mouth daily.     TRADJENTA 5 MG TABS tablet TAKE 1 TABLET EVERY DAY 90 tablet 0   Vitamin D, Ergocalciferol, (DRISDOL) 1.25 MG (50000 UNIT) CAPS capsule Take 50,000 Units by mouth every 30 (thirty) days.     HYDROcodone-acetaminophen (NORCO/VICODIN) 5-325 MG tablet Take 1 tablet by mouth every 6 (six) hours as needed for moderate pain. (Patient not taking: Reported on 07/13/2021) 30 tablet 0   losartan (COZAAR) 100 MG tablet  (Patient not taking: Reported on 07/13/2021)     sevelamer carbonate (RENVELA) 800 MG tablet  (Patient not taking: Reported on 07/13/2021)     tiZANidine (ZANAFLEX) 4 MG tablet Take 1 tablet (4 mg total) by mouth every 6 (six) hours as needed for up to 60 doses for muscle spasms. (Patient not taking: Reported on 07/13/2021) 60 tablet 2   No current facility-administered medications for this visit.    Allergies as of 07/13/2021 - Review Complete 07/13/2021  Allergen Reaction Noted   Tramadol hcl  02/27/2018   Opana [oxymorphone hcl] Itching 01/15/2014   Oxymorphone Itching 01/15/2014   Tramadol  Itching 07/10/2011    Family History  Problem Relation Age of Onset   Hypertension Mother        MI   Cancer Mother        breast    Diabetes Mother    Diabetes Father    Hypertension Father    Hypertension Sister    Diabetes Sister    Arthritis Other    Asthma Other    Lung disease Other    Anesthesia problems Neg Hx    Hypotension Neg Hx    Malignant hyperthermia Neg Hx    Pseudochol deficiency Neg Hx    Colon cancer Neg Hx     Social History   Socioeconomic History   Marital status: Married    Spouse name: Not on file   Number of children: 2   Years of education: 12th grade   Highest education level: Not on file  Occupational History   Occupation: disabled   Occupation:      Fish farm manager: UNEMPLOYED  Tobacco Use   Smoking status: Former    Packs/day: 1.00    Years: 25.00    Pack years: 25.00    Types: Cigarettes    Quit date: 03/27/2010    Years since quitting: 11.3   Smokeless tobacco: Never   Tobacco comments:    Quit x 7 years  Vaping Use   Vaping Use: Never used  Substance and Sexual Activity   Alcohol use: Yes    Alcohol/week: 0.0 standard drinks    Comment: occasionally   Drug use: Not Currently    Types: Marijuana    Comment: cocaine- last time used- 11/24/2017 , marijuana-    Sexual activity: Not on file  Other Topics Concern   Not on file  Social History Narrative   He quit smoking in 2010. 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 He developed pulmonary problems, became disabled because of lower airway disease in 2009.       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   Social Determinants of Health   Financial Resource Strain: Not on file  Food Insecurity: Not on file  Transportation Needs: Not on file  Physical Activity: Not on file  Stress: Not on file  Social Connections: Not on file    Review of Systems: Gen: Denies fever, chills, anorexia. Denies fatigue, weakness, weight loss.  CV: Denies  chest pain, palpitations, syncope, peripheral edema, and claudication. Resp: Denies dyspnea at rest, cough, wheezing, coughing up blood, and pleurisy. GI: see HPI Derm: Denies rash, itching, dry skin Psych: Denies depression, anxiety, memory loss, confusion. No homicidal or suicidal ideation.  Heme: see HPI  Physical Exam: BP 123/77    Pulse 74    Temp (!) 97.1 F (36.2 C) (Temporal)    Ht 5\' 4"  (1.626 m)    Wt 237 lb 12.8 oz (107.9 kg)    BMI 40.82 kg/m  General:   Alert and oriented. No distress noted. Pleasant and cooperative.  Head:  Normocephalic and atraumatic. Eyes:  Conjuctiva clear without scleral icterus. Mouth:  mask in place Rectal: small external hemorrhoid tag, no prolapsing hemorrhoids, DRE with mild discomfort Msk:  Symmetrical without gross deformities. Normal posture. Extremities:  Without edema. Neurologic:  Alert and  oriented x4 Psych:  Alert and cooperative. Normal mood and affect.  ASSESSMENT/PLAN: [name] [name] is a 70 y.o. male presenting today with recent rectal bleeding likely secondary to symptomatic hemorrhoids.   He notes sitting prolonged periods of time on toilet but denies constipation or straining. We discussed supportive management of hemorrhoids. I have sent in Anusol cream as well. We briefly discussed hemorrhoid banding. As last colonoscopy was in 2016, I recommended colonoscopy. He would like to hold off on this right now and pursue if symptoms continue.  We will see him in 3 months.  Annitta Needs, PhD, ANP-BC [facility]

## 2021-07-13 NOTE — Patient Instructions (Signed)
I have sent in a cream to your pharmacy to use per rectum twice a day for next 7 days.   Please call if you continue to have issues.  Make sure to avoid constipation, avoid straining, and only sit on the toilet for 2-3 minutes, no longer.   We will see you back in 3 months or so! Please call if this continues, and we will do a colonoscopy.  It was a pleasure to see you today. I want to create trusting relationships with patients to provide genuine, compassionate, and quality care. I value your feedback. If you receive a survey regarding your visit,  I greatly appreciate you taking time to fill this out.   Annitta Needs, PhD, ANP-BC [facility]

## 2021-07-14 DIAGNOSIS — D509 Iron deficiency anemia, unspecified: Secondary | ICD-10-CM | POA: Diagnosis not present

## 2021-07-14 DIAGNOSIS — D631 Anemia in chronic kidney disease: Secondary | ICD-10-CM | POA: Diagnosis not present

## 2021-07-14 DIAGNOSIS — N2581 Secondary hyperparathyroidism of renal origin: Secondary | ICD-10-CM | POA: Diagnosis not present

## 2021-07-14 DIAGNOSIS — Z992 Dependence on renal dialysis: Secondary | ICD-10-CM | POA: Diagnosis not present

## 2021-07-14 DIAGNOSIS — N186 End stage renal disease: Secondary | ICD-10-CM | POA: Diagnosis not present

## 2021-07-15 DIAGNOSIS — D631 Anemia in chronic kidney disease: Secondary | ICD-10-CM | POA: Diagnosis not present

## 2021-07-15 DIAGNOSIS — Z992 Dependence on renal dialysis: Secondary | ICD-10-CM | POA: Diagnosis not present

## 2021-07-15 DIAGNOSIS — N186 End stage renal disease: Secondary | ICD-10-CM | POA: Diagnosis not present

## 2021-07-15 DIAGNOSIS — D509 Iron deficiency anemia, unspecified: Secondary | ICD-10-CM | POA: Diagnosis not present

## 2021-07-15 DIAGNOSIS — N2581 Secondary hyperparathyroidism of renal origin: Secondary | ICD-10-CM | POA: Diagnosis not present

## 2021-07-16 DIAGNOSIS — Z992 Dependence on renal dialysis: Secondary | ICD-10-CM | POA: Diagnosis not present

## 2021-07-16 DIAGNOSIS — N186 End stage renal disease: Secondary | ICD-10-CM | POA: Diagnosis not present

## 2021-07-16 DIAGNOSIS — D631 Anemia in chronic kidney disease: Secondary | ICD-10-CM | POA: Diagnosis not present

## 2021-07-16 DIAGNOSIS — N2581 Secondary hyperparathyroidism of renal origin: Secondary | ICD-10-CM | POA: Diagnosis not present

## 2021-07-16 DIAGNOSIS — D509 Iron deficiency anemia, unspecified: Secondary | ICD-10-CM | POA: Diagnosis not present

## 2021-07-17 ENCOUNTER — Other Ambulatory Visit: Payer: Self-pay | Admitting: Nurse Practitioner

## 2021-07-17 ENCOUNTER — Other Ambulatory Visit: Payer: Self-pay | Admitting: Gastroenterology

## 2021-07-17 DIAGNOSIS — Z992 Dependence on renal dialysis: Secondary | ICD-10-CM | POA: Diagnosis not present

## 2021-07-17 DIAGNOSIS — D631 Anemia in chronic kidney disease: Secondary | ICD-10-CM | POA: Diagnosis not present

## 2021-07-17 DIAGNOSIS — D509 Iron deficiency anemia, unspecified: Secondary | ICD-10-CM | POA: Diagnosis not present

## 2021-07-17 DIAGNOSIS — N2581 Secondary hyperparathyroidism of renal origin: Secondary | ICD-10-CM | POA: Diagnosis not present

## 2021-07-17 DIAGNOSIS — N186 End stage renal disease: Secondary | ICD-10-CM | POA: Diagnosis not present

## 2021-07-17 DIAGNOSIS — I219 Acute myocardial infarction, unspecified: Secondary | ICD-10-CM

## 2021-07-17 HISTORY — DX: Acute myocardial infarction, unspecified: I21.9

## 2021-07-18 DIAGNOSIS — Z992 Dependence on renal dialysis: Secondary | ICD-10-CM | POA: Diagnosis not present

## 2021-07-18 DIAGNOSIS — D509 Iron deficiency anemia, unspecified: Secondary | ICD-10-CM | POA: Diagnosis not present

## 2021-07-18 DIAGNOSIS — N2581 Secondary hyperparathyroidism of renal origin: Secondary | ICD-10-CM | POA: Diagnosis not present

## 2021-07-18 DIAGNOSIS — N186 End stage renal disease: Secondary | ICD-10-CM | POA: Diagnosis not present

## 2021-07-18 DIAGNOSIS — D631 Anemia in chronic kidney disease: Secondary | ICD-10-CM | POA: Diagnosis not present

## 2021-07-19 DIAGNOSIS — Z992 Dependence on renal dialysis: Secondary | ICD-10-CM | POA: Diagnosis not present

## 2021-07-19 DIAGNOSIS — N186 End stage renal disease: Secondary | ICD-10-CM | POA: Diagnosis not present

## 2021-07-19 DIAGNOSIS — D509 Iron deficiency anemia, unspecified: Secondary | ICD-10-CM | POA: Diagnosis not present

## 2021-07-19 DIAGNOSIS — D631 Anemia in chronic kidney disease: Secondary | ICD-10-CM | POA: Diagnosis not present

## 2021-07-19 DIAGNOSIS — N2581 Secondary hyperparathyroidism of renal origin: Secondary | ICD-10-CM | POA: Diagnosis not present

## 2021-07-20 DIAGNOSIS — N186 End stage renal disease: Secondary | ICD-10-CM | POA: Diagnosis not present

## 2021-07-20 DIAGNOSIS — N2581 Secondary hyperparathyroidism of renal origin: Secondary | ICD-10-CM | POA: Diagnosis not present

## 2021-07-20 DIAGNOSIS — D509 Iron deficiency anemia, unspecified: Secondary | ICD-10-CM | POA: Diagnosis not present

## 2021-07-20 DIAGNOSIS — E119 Type 2 diabetes mellitus without complications: Secondary | ICD-10-CM | POA: Diagnosis not present

## 2021-07-20 DIAGNOSIS — Z794 Long term (current) use of insulin: Secondary | ICD-10-CM | POA: Diagnosis not present

## 2021-07-20 DIAGNOSIS — Z992 Dependence on renal dialysis: Secondary | ICD-10-CM | POA: Diagnosis not present

## 2021-07-20 DIAGNOSIS — D631 Anemia in chronic kidney disease: Secondary | ICD-10-CM | POA: Diagnosis not present

## 2021-07-21 DIAGNOSIS — N186 End stage renal disease: Secondary | ICD-10-CM | POA: Diagnosis not present

## 2021-07-21 DIAGNOSIS — D631 Anemia in chronic kidney disease: Secondary | ICD-10-CM | POA: Diagnosis not present

## 2021-07-21 DIAGNOSIS — N2581 Secondary hyperparathyroidism of renal origin: Secondary | ICD-10-CM | POA: Diagnosis not present

## 2021-07-21 DIAGNOSIS — I10 Essential (primary) hypertension: Secondary | ICD-10-CM | POA: Diagnosis not present

## 2021-07-21 DIAGNOSIS — J309 Allergic rhinitis, unspecified: Secondary | ICD-10-CM | POA: Diagnosis not present

## 2021-07-21 DIAGNOSIS — Z992 Dependence on renal dialysis: Secondary | ICD-10-CM | POA: Diagnosis not present

## 2021-07-21 DIAGNOSIS — D509 Iron deficiency anemia, unspecified: Secondary | ICD-10-CM | POA: Diagnosis not present

## 2021-07-21 DIAGNOSIS — E1165 Type 2 diabetes mellitus with hyperglycemia: Secondary | ICD-10-CM | POA: Diagnosis not present

## 2021-07-21 NOTE — Telephone Encounter (Signed)
Is patient taking Pantoprazole once or twice daily? Looks like he may have been doing well once once daily dosing when he saw Dr. Abbey Chatters in August.

## 2021-07-22 ENCOUNTER — Telehealth: Payer: Self-pay | Admitting: Internal Medicine

## 2021-07-22 ENCOUNTER — Other Ambulatory Visit: Payer: Self-pay

## 2021-07-22 ENCOUNTER — Emergency Department (HOSPITAL_COMMUNITY): Payer: Medicare Other

## 2021-07-22 ENCOUNTER — Inpatient Hospital Stay (HOSPITAL_COMMUNITY)
Admission: EM | Admit: 2021-07-22 | Discharge: 2021-07-27 | DRG: 246 | Disposition: A | Discharge status: Home / Self Care | Payer: Medicare Other | Attending: Internal Medicine | Admitting: Internal Medicine

## 2021-07-22 ENCOUNTER — Encounter (HOSPITAL_COMMUNITY): Payer: Self-pay | Admitting: Emergency Medicine

## 2021-07-22 DIAGNOSIS — D631 Anemia in chronic kidney disease: Secondary | ICD-10-CM | POA: Diagnosis present

## 2021-07-22 DIAGNOSIS — J449 Chronic obstructive pulmonary disease, unspecified: Secondary | ICD-10-CM | POA: Diagnosis not present

## 2021-07-22 DIAGNOSIS — I10 Essential (primary) hypertension: Secondary | ICD-10-CM

## 2021-07-22 DIAGNOSIS — Z955 Presence of coronary angioplasty implant and graft: Secondary | ICD-10-CM

## 2021-07-22 DIAGNOSIS — E782 Mixed hyperlipidemia: Secondary | ICD-10-CM | POA: Diagnosis not present

## 2021-07-22 DIAGNOSIS — K219 Gastro-esophageal reflux disease without esophagitis: Secondary | ICD-10-CM | POA: Diagnosis present

## 2021-07-22 DIAGNOSIS — E785 Hyperlipidemia, unspecified: Secondary | ICD-10-CM

## 2021-07-22 DIAGNOSIS — E559 Vitamin D deficiency, unspecified: Secondary | ICD-10-CM | POA: Diagnosis present

## 2021-07-22 DIAGNOSIS — D6959 Other secondary thrombocytopenia: Secondary | ICD-10-CM | POA: Diagnosis present

## 2021-07-22 DIAGNOSIS — G4733 Obstructive sleep apnea (adult) (pediatric): Secondary | ICD-10-CM | POA: Diagnosis present

## 2021-07-22 DIAGNOSIS — A0839 Other viral enteritis: Secondary | ICD-10-CM | POA: Diagnosis present

## 2021-07-22 DIAGNOSIS — M109 Gout, unspecified: Secondary | ICD-10-CM | POA: Diagnosis present

## 2021-07-22 DIAGNOSIS — E1122 Type 2 diabetes mellitus with diabetic chronic kidney disease: Secondary | ICD-10-CM | POA: Diagnosis not present

## 2021-07-22 DIAGNOSIS — Z7982 Long term (current) use of aspirin: Secondary | ICD-10-CM

## 2021-07-22 DIAGNOSIS — M549 Dorsalgia, unspecified: Secondary | ICD-10-CM | POA: Diagnosis present

## 2021-07-22 DIAGNOSIS — Z794 Long term (current) use of insulin: Secondary | ICD-10-CM | POA: Diagnosis not present

## 2021-07-22 DIAGNOSIS — E876 Hypokalemia: Secondary | ICD-10-CM | POA: Diagnosis not present

## 2021-07-22 DIAGNOSIS — E669 Obesity, unspecified: Secondary | ICD-10-CM | POA: Diagnosis not present

## 2021-07-22 DIAGNOSIS — I120 Hypertensive chronic kidney disease with stage 5 chronic kidney disease or end stage renal disease: Secondary | ICD-10-CM | POA: Diagnosis present

## 2021-07-22 DIAGNOSIS — I200 Unstable angina: Secondary | ICD-10-CM | POA: Diagnosis not present

## 2021-07-22 DIAGNOSIS — D509 Iron deficiency anemia, unspecified: Secondary | ICD-10-CM | POA: Diagnosis not present

## 2021-07-22 DIAGNOSIS — N2581 Secondary hyperparathyroidism of renal origin: Secondary | ICD-10-CM | POA: Diagnosis present

## 2021-07-22 DIAGNOSIS — I214 Non-ST elevation (NSTEMI) myocardial infarction: Secondary | ICD-10-CM | POA: Diagnosis not present

## 2021-07-22 DIAGNOSIS — I2510 Atherosclerotic heart disease of native coronary artery without angina pectoris: Secondary | ICD-10-CM | POA: Diagnosis present

## 2021-07-22 DIAGNOSIS — E1121 Type 2 diabetes mellitus with diabetic nephropathy: Secondary | ICD-10-CM | POA: Diagnosis not present

## 2021-07-22 DIAGNOSIS — Z7984 Long term (current) use of oral hypoglycemic drugs: Secondary | ICD-10-CM

## 2021-07-22 DIAGNOSIS — Z7951 Long term (current) use of inhaled steroids: Secondary | ICD-10-CM

## 2021-07-22 DIAGNOSIS — Z79899 Other long term (current) drug therapy: Secondary | ICD-10-CM

## 2021-07-22 DIAGNOSIS — M1990 Unspecified osteoarthritis, unspecified site: Secondary | ICD-10-CM | POA: Diagnosis present

## 2021-07-22 DIAGNOSIS — Z992 Dependence on renal dialysis: Secondary | ICD-10-CM | POA: Diagnosis not present

## 2021-07-22 DIAGNOSIS — Z885 Allergy status to narcotic agent status: Secondary | ICD-10-CM

## 2021-07-22 DIAGNOSIS — Z8249 Family history of ischemic heart disease and other diseases of the circulatory system: Secondary | ICD-10-CM

## 2021-07-22 DIAGNOSIS — Z6839 Body mass index (BMI) 39.0-39.9, adult: Secondary | ICD-10-CM | POA: Diagnosis not present

## 2021-07-22 DIAGNOSIS — N186 End stage renal disease: Secondary | ICD-10-CM | POA: Diagnosis present

## 2021-07-22 DIAGNOSIS — I447 Left bundle-branch block, unspecified: Secondary | ICD-10-CM | POA: Diagnosis present

## 2021-07-22 DIAGNOSIS — Z87891 Personal history of nicotine dependence: Secondary | ICD-10-CM

## 2021-07-22 DIAGNOSIS — U071 COVID-19: Secondary | ICD-10-CM | POA: Diagnosis not present

## 2021-07-22 DIAGNOSIS — Z96651 Presence of right artificial knee joint: Secondary | ICD-10-CM | POA: Diagnosis present

## 2021-07-22 DIAGNOSIS — I441 Atrioventricular block, second degree: Secondary | ICD-10-CM | POA: Diagnosis present

## 2021-07-22 DIAGNOSIS — R079 Chest pain, unspecified: Secondary | ICD-10-CM | POA: Diagnosis not present

## 2021-07-22 DIAGNOSIS — M898X9 Other specified disorders of bone, unspecified site: Secondary | ICD-10-CM | POA: Diagnosis present

## 2021-07-22 DIAGNOSIS — Z809 Family history of malignant neoplasm, unspecified: Secondary | ICD-10-CM

## 2021-07-22 DIAGNOSIS — Z833 Family history of diabetes mellitus: Secondary | ICD-10-CM

## 2021-07-22 DIAGNOSIS — R072 Precordial pain: Secondary | ICD-10-CM | POA: Diagnosis not present

## 2021-07-22 DIAGNOSIS — G8929 Other chronic pain: Secondary | ICD-10-CM | POA: Diagnosis present

## 2021-07-22 LAB — HEMOGLOBIN A1C
Hgb A1c MFr Bld: 6.6 % — ABNORMAL HIGH (ref 4.8–5.6)
Mean Plasma Glucose: 142.72 mg/dL

## 2021-07-22 LAB — BASIC METABOLIC PANEL
Anion gap: 16 — ABNORMAL HIGH (ref 5–15)
BUN: 59 mg/dL — ABNORMAL HIGH (ref 8–23)
CO2: 27 mmol/L (ref 22–32)
Calcium: 9.2 mg/dL (ref 8.9–10.3)
Chloride: 92 mmol/L — ABNORMAL LOW (ref 98–111)
Creatinine, Ser: 17.84 mg/dL — ABNORMAL HIGH (ref 0.61–1.24)
GFR, Estimated: 3 mL/min — ABNORMAL LOW (ref >60)
Glucose, Bld: 145 mg/dL — ABNORMAL HIGH (ref 70–99)
Potassium: 3.7 mmol/L (ref 3.5–5.1)
Sodium: 135 mmol/L (ref 135–145)

## 2021-07-22 LAB — RESP PANEL BY RT-PCR (FLU A&B, COVID) ARPGX2
Influenza A by PCR: NEGATIVE
Influenza B by PCR: NEGATIVE
SARS Coronavirus 2 by RT PCR: POSITIVE — AB

## 2021-07-22 LAB — CBC
HCT: 31.1 % — ABNORMAL LOW (ref 39.0–52.0)
Hemoglobin: 10.2 g/dL — ABNORMAL LOW (ref 13.0–17.0)
MCH: 30.8 pg (ref 26.0–34.0)
MCHC: 32.8 g/dL (ref 30.0–36.0)
MCV: 94 fL (ref 80.0–100.0)
Platelets: 100 10*3/uL — ABNORMAL LOW (ref 150–400)
RBC: 3.31 MIL/uL — ABNORMAL LOW (ref 4.22–5.81)
RDW: 13.4 % (ref 11.5–15.5)
WBC: 5 10*3/uL (ref 4.0–10.5)
nRBC: 0 % (ref 0.0–0.2)

## 2021-07-22 LAB — CBG MONITORING, ED
Glucose-Capillary: 72 mg/dL (ref 70–99)
Glucose-Capillary: 95 mg/dL (ref 70–99)

## 2021-07-22 LAB — HEPARIN LEVEL (UNFRACTIONATED): Heparin Unfractionated: 0.2 IU/mL — ABNORMAL LOW (ref 0.30–0.70)

## 2021-07-22 LAB — TROPONIN I (HIGH SENSITIVITY)
Troponin I (High Sensitivity): 3428 ng/L (ref <18)
Troponin I (High Sensitivity): 3386 ng/L

## 2021-07-22 MED ORDER — HEPARIN BOLUS VIA INFUSION
4000.0000 [IU] | Freq: Once | INTRAVENOUS | Status: AC
  Administered 2021-07-22: 4000 [IU] via INTRAVENOUS

## 2021-07-22 MED ORDER — SIMVASTATIN 20 MG PO TABS
20.0000 mg | ORAL_TABLET | Freq: Every morning | ORAL | Status: DC
  Administered 2021-07-23 – 2021-07-24 (×2): 20 mg via ORAL
  Filled 2021-07-22: qty 1
  Filled 2021-07-22: qty 2

## 2021-07-22 MED ORDER — ASPIRIN EC 81 MG PO TBEC
81.0000 mg | DELAYED_RELEASE_TABLET | Freq: Every day | ORAL | Status: DC
Start: 2021-07-23 — End: 2021-07-27
  Administered 2021-07-23 – 2021-07-27 (×4): 81 mg via ORAL
  Filled 2021-07-22 (×4): qty 1

## 2021-07-22 MED ORDER — ACETAMINOPHEN 325 MG PO TABS
650.0000 mg | ORAL_TABLET | ORAL | Status: DC | PRN
  Administered 2021-07-24 – 2021-07-27 (×4): 650 mg via ORAL
  Filled 2021-07-22 (×4): qty 2

## 2021-07-22 MED ORDER — METOPROLOL SUCCINATE ER 25 MG PO TB24
25.0000 mg | ORAL_TABLET | Freq: Every day | ORAL | Status: DC
  Administered 2021-07-23 – 2021-07-27 (×5): 25 mg via ORAL
  Filled 2021-07-22 (×5): qty 1

## 2021-07-22 MED ORDER — TORSEMIDE 100 MG PO TABS
100.0000 mg | ORAL_TABLET | Freq: Every day | ORAL | Status: DC
  Administered 2021-07-23 – 2021-07-27 (×4): 100 mg via ORAL
  Filled 2021-07-22 (×2): qty 1
  Filled 2021-07-22: qty 5
  Filled 2021-07-22 (×3): qty 1

## 2021-07-22 MED ORDER — HEPARIN (PORCINE) 25000 UT/250ML-% IV SOLN
1200.0000 [IU]/h | INTRAVENOUS | Status: DC
  Administered 2021-07-22: 16:00:00 1000 [IU]/h via INTRAVENOUS
  Administered 2021-07-23 – 2021-07-25 (×3): 1200 [IU]/h via INTRAVENOUS
  Filled 2021-07-22 (×4): qty 250

## 2021-07-22 MED ORDER — ALUM & MAG HYDROXIDE-SIMETH 200-200-20 MG/5ML PO SUSP
30.0000 mL | Freq: Once | ORAL | Status: AC
  Administered 2021-07-22: 30 mL via ORAL
  Filled 2021-07-22: qty 30

## 2021-07-22 MED ORDER — LOSARTAN POTASSIUM 50 MG PO TABS
50.0000 mg | ORAL_TABLET | Freq: Every day | ORAL | Status: DC
  Administered 2021-07-23 – 2021-07-27 (×4): 50 mg via ORAL
  Filled 2021-07-22: qty 1
  Filled 2021-07-22: qty 2
  Filled 2021-07-22 (×3): qty 1

## 2021-07-22 MED ORDER — INSULIN GLARGINE 100 UNIT/ML ~~LOC~~ SOLN
20.0000 [IU] | Freq: Every day | SUBCUTANEOUS | Status: DC
  Filled 2021-07-22 (×3): qty 0.2

## 2021-07-22 MED ORDER — METOPROLOL SUCCINATE ER 25 MG PO TB24: 25.0000 mg | ORAL_TABLET | Freq: Every day | ORAL | Status: DC

## 2021-07-22 MED ORDER — PANTOPRAZOLE SODIUM 40 MG PO TBEC
40.0000 mg | DELAYED_RELEASE_TABLET | Freq: Two times a day (BID) | ORAL | Status: DC
  Administered 2021-07-23 – 2021-07-27 (×9): 40 mg via ORAL
  Filled 2021-07-22 (×9): qty 1

## 2021-07-22 MED ORDER — INSULIN DETEMIR 100 UNIT/ML ~~LOC~~ SOLN
20.0000 [IU] | Freq: Every day | SUBCUTANEOUS | Status: DC
  Administered 2021-07-22 – 2021-07-26 (×5): 20 [IU] via SUBCUTANEOUS
  Filled 2021-07-22 (×8): qty 0.2

## 2021-07-22 MED ORDER — TORSEMIDE 20 MG PO TABS: 100.0000 mg | ORAL_TABLET | Freq: Every day | ORAL | Status: DC

## 2021-07-22 MED ORDER — INSULIN ASPART 100 UNIT/ML IJ SOLN
0.0000 [IU] | Freq: Three times a day (TID) | INTRAMUSCULAR | Status: DC
  Administered 2021-07-24 – 2021-07-26 (×2): 1 [IU] via SUBCUTANEOUS

## 2021-07-22 MED ORDER — INSULIN ASPART 100 UNIT/ML IJ SOLN: 0.0000 [IU] | Freq: Every day | INTRAMUSCULAR | Status: DC

## 2021-07-22 MED ORDER — NITROGLYCERIN 0.4 MG SL SUBL
0.4000 mg | SUBLINGUAL_TABLET | Freq: Once | SUBLINGUAL | Status: AC
Start: 2021-07-22 — End: 2021-07-22
  Administered 2021-07-22: 0.4 mg via SUBLINGUAL
  Filled 2021-07-22: qty 1

## 2021-07-22 MED ORDER — INSULIN GLARGINE 100 UNIT/ML ~~LOC~~ SOLN
25.0000 [IU] | Freq: Every day | SUBCUTANEOUS | Status: DC
  Filled 2021-07-22 (×3): qty 0.25

## 2021-07-22 MED ORDER — PANTOPRAZOLE SODIUM 40 MG PO TBEC: 40.0000 mg | DELAYED_RELEASE_TABLET | Freq: Every day | ORAL | Status: DC

## 2021-07-22 MED ORDER — MOMETASONE FURO-FORMOTEROL FUM 200-5 MCG/ACT IN AERO
2.0000 | INHALATION_SPRAY | Freq: Two times a day (BID) | RESPIRATORY_TRACT | Status: DC
  Administered 2021-07-22 – 2021-07-27 (×9): 2 via RESPIRATORY_TRACT
  Filled 2021-07-22 (×3): qty 8.8

## 2021-07-22 MED ORDER — ONDANSETRON HCL 4 MG/2ML IJ SOLN: 4.0000 mg | Freq: Four times a day (QID) | INTRAMUSCULAR | Status: DC | PRN

## 2021-07-22 MED ORDER — ASPIRIN 325 MG PO TABS
ORAL_TABLET | ORAL | Status: AC
  Filled 2021-07-22: qty 1

## 2021-07-22 MED ORDER — INSULIN GLARGINE 100 UNIT/ML SOLOSTAR PEN: 25.0000 [IU] | PEN_INJECTOR | Freq: Every day | SUBCUTANEOUS | Status: DC

## 2021-07-22 MED ORDER — NITROGLYCERIN 0.4 MG SL SUBL
0.4000 mg | SUBLINGUAL_TABLET | SUBLINGUAL | Status: DC | PRN
  Administered 2021-07-24 (×3): 0.4 mg via SUBLINGUAL
  Filled 2021-07-22 (×2): qty 1

## 2021-07-22 MED ORDER — ASPIRIN 325 MG PO TABS
325.0000 mg | ORAL_TABLET | Freq: Once | ORAL | Status: AC
  Administered 2021-07-22: 325 mg via ORAL
  Filled 2021-07-22: qty 1

## 2021-07-22 NOTE — ED Notes (Signed)
Dr Harl Bowie paged for consult

## 2021-07-22 NOTE — ED Triage Notes (Signed)
Pt c/o of chest pain that started last night. Pain was sharp at onset. Pt states it is now a dull pain that has not gone away. Pt also c/o of increased SOB since yesterday.

## 2021-07-22 NOTE — Progress Notes (Signed)
Cardiology Consultation:   Patient ID: [name] [name] MRN: [id number]; DOB: [birth year]  Admit date: 07/22/2021 Date of Consult: 07/22/2021  PCP:  [name] [name], [name] Providers Cardiologist:  [name] Casino, MD        Patient Profile:   [name] [name] is a 71 y.o. male with a hx of CAD (mild on cath 03/2013), IDDM, hyperlipidemia, hypertension, morbid obesity, OSA, ESRD on PD who is being seen 07/22/2021 for the evaluation of NSTEMI at the request of Dr Roderic Palau.  History of Present Illness:   [name] [name] is a 71 year old male with history of CAD (mild on cath 03/2013), IDDM, hyperlipidemia, hypertension, morbid obesity, OSA, ESRD on PD who presents with chest pain.  Most recent echocardiogram 08/29/2018 showed EF 50 to 55%, moderate biatrial enlargement.  [name] [name] 08/30/2018 showed normal perfusion.  He reports that his chest pain started yesterday morning.  Reports he had mild pressure in center of his chest.  However yesterday evening he was loading wood into a wheelbarrow and began having severe chest pain, which he describes as 10 out of 10.  Also started having nausea and vomiting.  He tried to lie down and eventually the pain subsided.  He called his cardiology office this morning and was recommended to go to the ED.  Reports on arrival in the ED was having 5 out of 10 pain.  He is currently chest pain-free.  In the ED, EKG shows sinus rhythm, second-degree AV block Mobitz type I, rate 78, LVH with repolarization abnormalities.  Initial vital signs notable for BP 109/71, pulse 95, SPO2 93% on room air.  Labs notable for troponin 3428, creatinine 18, sodium 135, potassium 3.7, hemoglobin 10.2, platelets 100.  Chest x-ray showed no acute abnormalities.   Past Medical History:  Diagnosis Date   Anemia    Arthritis    Asthma    Bell palsy    CAD (coronary artery disease)    a. 2014 MV: abnl w/ infap ischemia; b. 03/2013 Cath: aneurysmal bleb in the LAD w/ otw nonobs  dzs-->Med Rx.   Chronic back pain    Chronic knee pain    a. 09/2015 s/p R TKA.   Chronic pain    Chronic shoulder pain    Chronic sinusitis    CKD (chronic kidney disease), stage IV (HCC)    stage 4 per office visit note of Dr Lowanda Foster on 05/2015    COPD (chronic obstructive pulmonary disease) ([place])    Diabetes mellitus without complication ([place])    type II    Essential hypertension    GERD (gastroesophageal reflux disease)    Gout    Gout    Hepatomegaly    noted on noncontrast CT 2015   History of hiatal hernia    Hyperlipidemia    Lateral meniscus tear    Obesity    Truncal   Obstructive sleep apnea    does not use cpap    On home oxygen therapy    uses 2l when is going somewhere per patient    PUD (peptic ulcer disease)    remote, reports f/u EGD about 8 years ago unremarkable    Reactive airway disease    related to exposure to chemical during 9/11   Renal insufficiency    Sinusitis    Vitamin D deficiency     Past Surgical History:  Procedure Laterality Date   ASAD LT SHOULDER  12/15/2008   left shoulder   AV FISTULA  PLACEMENT Left 08/09/2016   Procedure: BRACHIOCEPHALIC ARTERIOVENOUS (AV) FISTULA CREATION LEFT ARM;  Surgeon: [name], MD;  Location: [facility];  Service: Vascular;  Laterality: Left;   CAPD INSERTION N/A 10/07/2018   Procedure: LAPAROSCOPIC PERITONEAL CATHETER PLACEMENT;  Surgeon: Kieth Brightly Arta Bruce, MD;  Location: [facility];  Service: General;  Laterality: N/A;   CATARACT EXTRACTION W/PHACO Left 03/28/2016   Procedure: CATARACT EXTRACTION PHACO AND INTRAOCULAR LENS PLACEMENT LEFT EYE;  Surgeon: [name], MD;  Location: [facility];  Service: Ophthalmology;  Laterality: Left;  CDE: 4.77   CATARACT EXTRACTION W/PHACO Right 04/11/2016   Procedure: CATARACT EXTRACTION PHACO AND INTRAOCULAR LENS PLACEMENT RIGHT EYE; CDE:  4.74;  Surgeon: [name], MD;  Location: [facility];  Service: Ophthalmology;  Laterality: Right;   COLONOSCOPY  10/15/2008    Fields: Rectal polyp obliterated, not retrieved, hemorrhoids, single ascending colon diverticulum near the CV. Next colonoscopy April 2020   COLONOSCOPY N/A 12/25/2014   SLF: 1. Colorectal polyps (2) removed 2. Small internal hemorrhoids 3. the left colon is severely redundant. hyperplastic polyps   DOPPLER ECHOCARDIOGRAPHY     ESOPHAGOGASTRODUODENOSCOPY N/A 12/25/2014   SLF: 1. Anemia most likely due to CRI, gastritis, gastric polyps 2. Moderate non-erosive gastriits and mild duodenitis.  3.TWo large gstric polyps removed.    EYE SURGERY  12/22/2010   tear duct probing-[place]   FOREIGN BODY REMOVAL  03/29/2011   Procedure: REMOVAL FOREIGN BODY EXTREMITY;  Surgeon: Arther Abbott, MD;  Location: [facility];  Service: Orthopedics;  Laterality: Right;  Removal Foreign Body Right Thumb   IR FLUORO GUIDE CV LINE RIGHT  08/06/2018   IR US GUIDE VASC ACCESS RIGHT  08/06/2018   KNEE ARTHROSCOPY  10/16/2007   left   KNEE ARTHROSCOPY WITH LATERAL MENISECTOMY Right 10/14/2015   Procedure: LEFT KNEE ARTHROSCOPY WITH PARTIAL LATERAL MENISECTOMY;  Surgeon: Carole Civil, MD;  Location: [facility];  Service: Orthopedics;  Laterality: Right;   LEFT HEART CATHETERIZATION WITH CORONARY ANGIOGRAM N/A 03/28/2013   Procedure: LEFT HEART CATHETERIZATION WITH CORONARY ANGIOGRAM;  Surgeon: Leonie Man, MD;  Location[facility];  Service: Cardiovascular;  Laterality: N/A;   NM MYOVIEW LTD     PENILE PROSTHESIS IMPLANT N/A 08/16/2015   Procedure: PENILE PROTHESIS INFLATABLE, three piece, Excisional biopsy of Penile ulcer, Penile molding;  Surgeon: Carolan Clines, MD;  Location: [facility];  Service: Urology;  Laterality: N/A;   PENILE PROSTHESIS IMPLANT N/A 12/24/2017   Procedure: REMOVAL AND  REPLACEMENT  COLOPLAST PENILE PROSTHESIS;  Surgeon: Lucas Mallow, MD;  Location: [facility];  Service: Urology;  Laterality: N/A;   QUADRICEPS TENDON REPAIR  07/21/2011   Procedure: REPAIR QUADRICEP TENDON;  Surgeon:  Arther Abbott, MD;  Location: [facility];  Service: Orthopedics;  Laterality: Right;   TOENAIL EXCISION     removed [id number]   UMBILICAL HERNIA REPAIR  07/17/2005   [place]      Inpatient Medications: Scheduled Meds:  aspirin       Continuous Infusions:  PRN Meds:   Allergies:    Allergies  Allergen Reactions   Tramadol Hcl    Opana [Oxymorphone Hcl] Itching   Oxymorphone Itching   Tramadol Itching    Social History:   Social History   Socioeconomic History   Marital status: Married    Spouse name: Not on file   Number of children: 2   Years of education: 12th grade   Highest education level: Not on file  Occupational History   Occupation:  disabled   Occupation:      Fish farm manager: UNEMPLOYED  Tobacco Use   Smoking status: Former    Packs/day: 1.00    Years: 25.00    Pack years: 25.00    Types: Cigarettes    Quit date: 03/27/2010    Years since quitting: 11.3   Smokeless tobacco: Never   Tobacco comments:    Quit x 7 years  Vaping Use   Vaping Use: Never used  Substance and Sexual Activity   Alcohol use: Yes    Alcohol/week: 0.0 standard drinks    Comment: occasionally   Drug use: Not Currently    Types: Marijuana    Comment: cocaine- last time used- 11/24/2017 , marijuana-    Sexual activity: Not on file  Other Topics Concern   Not on file  Social History Narrative   He quit smoking in 2010. 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 He developed pulmonary problems, became disabled because of lower airway disease in 2009.       WATCHES BASKETBALL. HIS TEAM IS [employer].   Social Determinants of Health   Financial Resource Strain: Not on file  Food Insecurity: Not on file  Transportation Needs: Not on file  Physical Activity: Not on file  Stress: Not on file  Social Connections: Not on file  Intimate Partner Violence: Not on file    Family History:    Family History  Problem Relation Age of Onset    Hypertension Mother        MI   Cancer Mother        breast    Diabetes Mother    Diabetes Father    Hypertension Father    Hypertension Sister    Diabetes Sister    Arthritis Other    Asthma Other    Lung disease Other    Anesthesia problems Neg Hx    Hypotension Neg Hx    Malignant hyperthermia Neg Hx    Pseudochol deficiency Neg Hx    Colon cancer Neg Hx      ROS:  Please see the history of present illness.   All other ROS reviewed and negative.     Physical Exam/Data:   Vitals:   07/22/21 1430 07/22/21 1430 07/22/21 1500 07/22/21 1530  BP: 94/68 94/68 114/75 135/84  Pulse:  71 62 81  Resp: (!) 22 19 20 19   Temp:      TempSrc:      SpO2:  98% 98%   Weight:      Height:       No intake or output data in the 24 hours ending 07/22/21 1540 Last 3 Weights 07/22/2021 07/13/2021 06/21/2021  Weight (lbs) 230 lb 237 lb 12.8 oz 237 lb  Weight (kg) 104.327 kg 107.865 kg 107.502 kg     Body mass index is 39.48 kg/m.  General:  Well nourished, well developed, in no acute distress HEENT: normal Neck: no JVD Vascular: No carotid bruits; D Cardiac:  normal S1, S2; RRR; no murmur  Lungs: Expiratory wheezing Abd: soft, nontender, no hepatomegaly  Ext: no edema Musculoskeletal:  No deformities, BUE and BLE strength normal and equal Skin: warm and dry  Neuro:  no focal abnormalities noted Psych:  Normal affect   EKG:  The EKG was personally reviewed and demonstrates:  EKG shows sinus rhythm, second-degree AV block Mobitz type I, rate 78, LVH with repolarization abnormalities Telemetry:  Telemetry was personally reviewed and demonstrates: Second-degree  A-V block Mobitz type I, rate 60s to 80s  Relevant CV Studies:   Laboratory Data:  High Sensitivity Troponin:   Recent Labs  Lab 07/22/21 1416  TROPONINIHS 3,428*     Chemistry Recent Labs  Lab 07/22/21 1416  NA 135  K 3.7  CL 92*  CO2 27  GLUCOSE 145*  BUN 59*  CREATININE 17.84*  CALCIUM 9.2  GFRNONAA 3*   ANIONGAP 16*    No results for input(s): PROT, ALBUMIN, AST, ALT, ALKPHOS, BILITOT in the last 168 hours. Lipids No results for input(s): CHOL, TRIG, HDL, LABVLDL, LDLCALC, CHOLHDL in the last 168 hours.  Hematology Recent Labs  Lab 07/22/21 1416  WBC 5.0  RBC 3.31*  HGB 10.2*  HCT 31.1*  MCV 94.0  MCH 30.8  MCHC 32.8  RDW 13.4  PLT 100*   Thyroid No results for input(s): TSH, FREET4 in the last 168 hours.  BNPNo results for input(s): BNP, PROBNP in the last 168 hours.  DDimer No results for input(s): DDIMER in the last 168 hours.   Radiology/Studies:  DG Chest Port 1 View  Result Date: 07/22/2021 CLINICAL DATA:  chest pain EXAM: PORTABLE CHEST 1 VIEW COMPARISON:  August 29, 2018. FINDINGS: No consolidation. No visible pleural effusions or pneumothorax. Cardiomediastinal silhouette is within normal limits. No evidence of acute osseous abnormality. IMPRESSION: No evidence of acute cardiopulmonary disease. Electronically Signed   By: Margaretha Sheffield M.D.   On: 07/22/2021 14:31     Assessment and Plan:   NSTEMI: History of nonobstructive CAD, presented with chest pain.  Initial troponin 3428.  Currently chest pain-free -Aspirin 81 mg daily -Start heparin drip -As needed sublingual nitroglycerin -Continue Toprol-XL 25 mg daily -Continue statin -Echocardiogram -Trend troponins -Transfer to [facility] for [facility]  Hypertension: Continue losartan, Toprol-XL.  Hyperlipidemia: Continue simvastatin, LDL 53 on 06/10/2021  ESRD: On PD.  Management per nephrology  IDDM: Management per primary team     For questions or updates, please contact [place] Please consult [email or web address] for contact info under    Signed, Donato Heinz, MD  07/22/2021 3:40 PM

## 2021-07-22 NOTE — Progress Notes (Signed)
ANTICOAGULATION CONSULT NOTE - Initial Consult  Pharmacy Consult for Heparin Indication: chest pain/ACS  Allergies  Allergen Reactions   Tramadol Hcl    Opana [Oxymorphone Hcl] Itching   Oxymorphone Itching   Tramadol Itching    Patient Measurements: Height: 5\' 4"  (162.6 cm) Weight: 104.3 kg (230 lb) IBW/kg (Calculated) : 59.2 HEPARIN DW (KG): 83.1   Vital Signs: Temp: 98.2 F (36.8 C) (01/06 1357) Temp Source: Oral (01/06 1357) BP: 135/[name] (01/06 1530) Pulse Rate: 81 (01/06 1530)  Labs: Recent Labs    07/22/21 1416  HGB 10.2*  HCT 31.1*  PLT 100*  CREATININE 17.[name]*  TROPONINIHS 3,428*    Estimated Creatinine Clearance: 4.1 mL/min (A) (by C-G formula based on SCr of 17.[name] mg/dL (H)).   Medical History: Past Medical History:  Diagnosis Date   Anemia    Arthritis    Asthma    Bell palsy    CAD (coronary artery disease)    a. 2014 MV: abnl w/ infap ischemia; b. 03/2013 Cath: aneurysmal bleb in the LAD w/ otw nonobs dzs-->Med Rx.   Chronic back pain    Chronic knee pain    a. 09/2015 s/p R TKA.   Chronic pain    Chronic shoulder pain    Chronic sinusitis    CKD (chronic kidney disease), stage IV (HCC)    stage 4 per office visit note of Dr Lowanda Foster on 05/2015    COPD (chronic obstructive pulmonary disease) ([place])    Diabetes mellitus without complication ([place])    type II    Essential hypertension    GERD (gastroesophageal reflux disease)    Gout    Gout    Hepatomegaly    noted on noncontrast CT 2015   History of hiatal hernia    Hyperlipidemia    Lateral meniscus tear    Obesity    Truncal   Obstructive sleep apnea    does not use cpap    On home oxygen therapy    uses 2l when is going somewhere per patient    PUD (peptic ulcer disease)    remote, reports f/u EGD about 8 years ago unremarkable    Reactive airway disease    related to exposure to chemical during 9/11   Renal insufficiency    Sinusitis    Vitamin D deficiency     Medications:   See med rec  Assessment:  71 year old [name] with history of CAD (mild on cath 03/2013), IDDM, hyperlipidemia, hypertension, morbid obesity, OSA, ESRD on PD who presents with chest pain. Patient is not on oral anticoagulants.  Pharmacy asked to start heparin.  Goal of Therapy:  Heparin level 0.3-0.7 units/ml Monitor platelets by anticoagulation protocol: Yes   Plan:  Give 4000 units bolus x 1 Start heparin infusion at 1000 units/hr Check anti-Xa level in ~8 hours and daily while on heparin Continue to monitor H&H and platelets  Isac Sarna, BS Vena [place], BCPS Clinical Pharmacist Pager [phone] 07/22/2021,3:48 PM

## 2021-07-22 NOTE — Telephone Encounter (Signed)
Spoke to pt, he informed me that he was taking 2 Pantoprazole daily

## 2021-07-22 NOTE — Telephone Encounter (Signed)
Patient called in to report having chest "severe chest pain last night and vomited". His blood pressure last night was 124/79. At this time, he is having chest pressure and dull pain. I recommended that he go to the emergency room to be evaluated. He voiced understanding of this conversation.

## 2021-07-22 NOTE — ED Notes (Signed)
Hospitalist notified of bp

## 2021-07-22 NOTE — ED Provider Notes (Signed)
[facility] EMERGENCY DEPARTMENT Provider Note   CSN: [id number] Arrival date & time: 07/22/21  1342     History  Chief Complaint  Patient presents with   Chest Pain    [name] [name] is a 71 y.o. male.  HPI  Patient with medical history including end-stage renal disease currently on peritoneal dialysis, diabetes, hypertension, hyperlipidemia presents to the emergency department with chief complaint of chest pain.  Patient  chest pain started yesterday, started while he was loading wood into his fireplace, states the pain remains in the middle of his chest does not radiate, states he became diaphoretic, nauseous and vomited, states he had to lay down.  He endorses that the pain has remained consistent, has not improved, he denies any shortness of breath, peripheral edema, no orthopnea, he does not smoke, has no cardiac history, has never had this in the past.  He has no other complaints at this time.  Home Medications Prior to Admission medications   Medication Sig Start Date End Date Taking? Authorizing Provider  albuterol (VENTOLIN HFA) 108 (90 Base) MCG/ACT inhaler Inhale 2 puffs into the lungs every 6 (six) hours as needed for wheezing or shortness of breath. 03/25/21   Rigoberto Noel, MD  amLODipine (NORVASC) 5 MG tablet Take 1 tablet (5 mg total) by mouth daily. 11/14/19   Pixie Casino, MD  aspirin EC 81 MG tablet Take 81 mg by mouth daily. Swallow whole.    [provider]  budesonide-formoterol (SYMBICORT) 160-4.5 MCG/ACT inhaler Inhale 2 puffs into the lungs 2 (two) times daily. 04/07/20   Collene Gobble, MD  calcitRIOL (ROCALTROL) 0.25 MCG capsule Take 0.25 mcg by mouth daily.    [provider]  diclofenac (CATAFLAM) 50 MG tablet Take 1 tablet (50 mg total) by mouth 2 (two) times daily. 04/25/21   Carole Civil, MD  diclofenac (VOLTAREN) 50 MG EC tablet Take 1 tablet (50 mg total) by mouth 2 (two) times daily. 04/25/21   Carole Civil, MD   fluticasone (FLONASE) 50 MCG/ACT nasal spray Place 2 sprays into both nostrils daily. 05/09/19   Evalee Jefferson, PA-C  glucose blood test strip 1 each by Other route 2 (two) times daily. Use as instructed to check blood glucose twice daily 02/22/21   Brita Romp, NP  HYDROcodone-acetaminophen (NORCO/VICODIN) 5-325 MG tablet Take 1 tablet by mouth every 6 (six) hours as needed for moderate pain. Patient not taking: Reported on 07/13/2021 05/09/21   Carole Civil, MD  hydrocortisone (ANUSOL-HC) 2.5 % rectal cream Place 1 application rectally 2 (two) times daily. 07/13/21   Annitta Needs, NP  Insulin Pen Needle (DROPLET PEN NEEDLES) 31G X 5 MM MISC Use to inject insulin daily as directed 12/30/20   Cassandria Anger, MD  LANTUS SOLOSTAR 100 UNIT/ML Solostar Pen INJECT  30 UNITS SUBCUTANEOUSLY AT BEDTIME 12/15/20   Brita Romp, NP  losartan (COZAAR) 100 MG tablet  02/04/20   [provider]  metoprolol succinate (TOPROL-XL) 25 MG 24 hr tablet Take 25 mg by mouth daily.    [provider]  mometasone (NASONEX) 50 MCG/ACT nasal spray Place 2 sprays into the nose 2 (two) times daily as needed (allergies).    [provider]  pantoprazole (PROTONIX) 40 MG tablet TAKE 1 TABLET TWICE DAILY 30 MINUTES PRIOR TO MEALS 08/05/20   Mahala Menghini, PA-C  sevelamer carbonate (RENVELA) 800 MG tablet  12/06/20   [provider]  simvastatin (ZOCOR)  20 MG tablet Take 20 mg by mouth every morning.    [provider]  tiZANidine (ZANAFLEX) 4 MG tablet Take 1 tablet (4 mg total) by mouth every 6 (six) hours as needed for up to 60 doses for muscle spasms. Patient not taking: Reported on 07/13/2021 04/25/21   Carole Civil, MD  torsemide [facility]) 100 MG tablet Take 100 mg by mouth daily. 05/03/21   [provider]  TRADJENTA 5 MG TABS tablet TAKE 1 TABLET EVERY DAY 07/19/21   Brita Romp, NP  Vitamin D, Ergocalciferol, (DRISDOL) 1.25 MG (50000  UNIT) CAPS capsule Take 50,000 Units by mouth every 30 (thirty) days. 05/03/21   [provider]      Allergies    Tramadol hcl, Opana [oxymorphone hcl], Oxymorphone, and Tramadol    Review of Systems   Review of Systems  Constitutional:  Negative for chills and fever.  HENT:  Negative for congestion.   Respiratory:  Negative for shortness of breath.   Cardiovascular:  Positive for chest pain. Negative for palpitations.  Gastrointestinal:  Positive for diarrhea and vomiting. Negative for abdominal pain.  Genitourinary:  Negative for enuresis.  Musculoskeletal:  Negative for neck pain.  Skin:  Negative for rash.  Neurological:  Negative for headaches.   Physical Exam Updated Vital Signs BP 132/82    Pulse 91    Temp 98.2 F (36.8 C) (Oral)    Resp (!) 24    Ht 5\' 4"  (1.626 m)    Wt 104.3 kg    SpO2 97%    BMI 39.48 kg/m  Physical Exam Vitals and nursing note reviewed.  Constitutional:      General: He is not in acute distress.    Appearance: He is not ill-appearing.  HENT:     Head: Normocephalic and atraumatic.     Nose: No congestion.  Eyes:     Conjunctiva/sclera: Conjunctivae normal.  Cardiovascular:     Rate and Rhythm: Normal rate and regular rhythm.     Pulses: Normal pulses.     Heart sounds: No murmur heard.   No friction rub. No gallop.  Pulmonary:     Effort: No respiratory distress.     Breath sounds: No wheezing, rhonchi or rales.  Abdominal:     Palpations: Abdomen is soft.     Tenderness: There is no abdominal tenderness. There is no right CVA tenderness or left CVA tenderness.     Comments: Has noted dialysis catheter present in the abdomen, no signs of infection, abdomen soft nontender to palpation, no guarding, rebound touch, peritoneal sign.  Musculoskeletal:     Right lower leg: No edema.     Left lower leg: No edema.  Skin:    General: Skin is warm and dry.  Neurological:     Mental Status: He is alert.  Psychiatric:        Mood and  Affect: Mood normal.    ED Results / Procedures / Treatments   Labs (all labs ordered are listed, but only abnormal results are displayed) Labs Reviewed  BASIC METABOLIC PANEL - Abnormal; Notable for the following components:      Result Value   Chloride 92 (*)    Glucose, Bld 145 (*)    BUN 59 (*)    Creatinine, Ser 17.84 (*)    GFR, Estimated 3 (*)    Anion gap 16 (*)    All other components within normal limits  CBC - Abnormal; Notable for the  following components:   RBC 3.31 (*)    Hemoglobin 10.2 (*)    HCT 31.1 (*)    Platelets 100 (*)    All other components within normal limits  TROPONIN I (HIGH SENSITIVITY) - Abnormal; Notable for the following components:   Troponin I (High Sensitivity) 3,428 (*)    All other components within normal limits  RESP PANEL BY RT-PCR (FLU A&B, COVID) [id number]  HEPARIN LEVEL (UNFRACTIONATED)  TROPONIN I (HIGH SENSITIVITY)     Radiology DG Chest Port 1 View  Result Date: 07/22/2021 CLINICAL DATA:  chest pain EXAM: PORTABLE CHEST 1 VIEW COMPARISON:  August 29, 2018. FINDINGS: No consolidation. No visible pleural effusions or pneumothorax. Cardiomediastinal silhouette is within normal limits. No evidence of acute osseous abnormality. IMPRESSION: No evidence of acute cardiopulmonary disease. Electronically Signed   By: Margaretha Sheffield M.D.   On: 07/22/2021 14:31    Procedures .Critical Care Performed by: Marcello Fennel, PA-C Authorized by: Marcello Fennel, PA-C   Critical care provider statement:    Critical care time (minutes):  30   Critical care time was exclusive of:  Separately billable procedures and treating other patients   Critical care was necessary to treat or prevent imminent or life-threatening deterioration of the following conditions:  Cardiac failure   Critical care was time spent personally by me on the following activities:  Ordering and performing treatments and interventions, ordering and review of laboratory  studies, ordering and review of radiographic studies, pulse oximetry, re-evaluation of patient's condition, review of old charts, evaluation of patient's response to treatment, discussions with consultants and development of treatment plan with patient or surrogate   I assumed direction of critical care for this patient from another provider in my specialty: no     Care discussed with: admitting provider      Medications Ordered in ED Medications  heparin bolus via infusion 4,000 Units (4,000 Units Intravenous Bolus from Bag 07/22/21 1614)    And  heparin ADULT infusion 100 units/mL (25000 units/266mL) (1,000 Units/hr Intravenous New Bag/Given 07/22/21 1611)  alum & mag hydroxide-simeth (MAALOX/MYLANTA) 200-200-20 MG/5ML suspension 30 mL (30 mLs Oral Given 07/22/21 1502)  aspirin tablet 325 mg ( Oral Canceled Entry 07/22/21 1555)  nitroGLYCERIN (NITROSTAT) SL tablet 0.4 mg (0.4 mg Sublingual Given 07/22/21 1538)    ED Course/ Medical Decision Making/ A&P                           Medical Decision Making  This patient presents to the ED for concern of chest pain, this involves an extensive number of treatment options, and is a complaint that carries with it a high risk of complications and morbidity.  The differential diagnosis includes ACS, PE, volume overloaded    Additional history obtained:  Additional history obtained from electronic medical records External records from outside source obtained and reviewed including previous cardiology note   Co morbidities that complicate the patient evaluation  Dialysis, hypertension, hyperlipidemia  Social Determinants of Health:  N/A    Lab Tests:  I Ordered, and personally interpreted labs.  The pertinent results include: CBC shows hemoglobin of 10.2 at baseline for patient, BMP shows chloride of 92 glucose 145 BUN 59 at baseline, creatinine 17.8 later at baseline, GFR 3.  First troponin is 3428   Imaging Studies ordered:  I ordered  imaging studies including chest x-ray  I independently visualized and interpreted imaging which showed unremarkable I agree with  the radiologist interpretation   Cardiac Monitoring:  The patient was maintained on a cardiac monitor.  I personally viewed and interpreted the cardiac monitored which showed an underlying rhythm of: EKG sinus without signs of ischemia   Medicines ordered and prescription drug management:  I ordered medication including aspirin and morphine for chest pain protocol I have reviewed the patients home medicines and have made adjustments as needed  Critical Interventions:  Patient was started on heparin due to likely NSTEMI   Reevaluation:  After the interventions noted above, I reevaluated the patient and found that they have :improved  Notified that patient had a troponin of 3400, likely patient suffering from an NSTEMI, will repeat EKG and consult with cardiology for further recommendations.  EKG was negative for signs of ischemia, update on recommendation from cardiology he is agreement this plan will consult hospitalist team for admission.  Consultations Obtained:  I requested consultation with the spoke with Dr. Harrell Gave,  and discussed lab and imaging findings as well as pertinent plan - they recommend: Recommend starting patient on heparin, and transfer down to [facility], patient can be a hospital admission and cardiology will follow Spoke with Dr. Waldron Labs who will admit the patient.     Rule out I have low suspicion for ACS as history is atypical, patient has no cardiac history, EKG was sinus rhythm without signs of ischemia.  He does have noted elevated troponin by SPECT this is more likely a NSTEMI has pain started yesterday and there is no signs of acute infarct seen on EKG.  Low suspicion for PE as patient denies pleuritic chest pain, shortness of breath, vital signs reassuring, nontachypneic, nonhypoxic, no unilateral leg swelling  present.  Presentation more consistent with NSTEMI.  Low suspicion for AAA or aortic dissection as history is atypical, patient has low risk factors.  I have low suspicion for emergent hemodialysis as there is no new oxygen requirements, no peripheral edema present, no submitted electrolyte derailments.  Low suspicion for systemic infection as patient is nontoxic-appearing, vital signs reassuring, no obvious source infection noted on exam.     Dispostion and problem list  After consideration of the diagnostic results and the patients response to treatment, I feel that the patent would benefit from   NSTEMI-started on heparin, aspirin, provided nitro for comfort.  Patient will need to be transferred down to [facility] admit to the hospitalist team with cardiac follow-up.             Final Clinical Impression(s) / ED Diagnoses Final diagnoses:  NSTEMI (non-ST elevated myocardial infarction) [facility])    Rx / [place] Orders ED Discharge Orders     None         Marcello Fennel, PA-C 07/22/21 1648    Milton Ferguson, MD 07/24/21 1153

## 2021-07-22 NOTE — H&P (Signed)
TRH H&P   Patient Demographics:    Daivik Overley, is a 71 y.o. male  MRN: [id number]   DOB - [birth year]  Admit Date - 07/22/2021  Outpatient Primary MD for the patient is [name], MD  Referring MD/NP/PA: [name]  Outpatient Specialists: cardiology Dr Debara Pickett, renal Dr Juleen China  Patient coming from: home  Chief Complaint  Patient presents with   Chest Pain      HPI:    Kerim Statzer  is a 71 y.o. male, with past medical history of ESRD, on peritoneal dialysis, diabetes mellitus, hypertension, hyperlipidemia, CAD, with mild on cardiac cath 03/2013. -Patient presents to ED secondary to complaints of chest pain, reports in the center of his chest, started yesterday morning, has worsened in the evening with minimal activity as he was loading some wood into a wheelbarrow, reports chest pain is severe, 10 out of 10 intensity, accompanied by some nausea, and vomiting, and some dyspnea as well, he called cardiology office who recommended him to come to ED, chest pain 5 out of 10 on presentation, but he is currently chest pain-free,. -In ED work-up significant for EKG with sinus rhythm, Mobitz 1 second-degree AV block, blood pressure 109/71 on presentation, saturating 93% on room air, initial troponin significantly elevated at 3428, creatinine of 18, sodium of 135, hemoglobin of 10, platelet of 100 K, chest x-ray with no acute findings, patient was seen by cardiology where he started on aspirin, full dose heparin for NSTEMI, and recommendation has been made to transfer to [facility] for [facility].    Review of systems:    In addition to the HPI above,  No Fever-chills, No Headache, No changes with Vision or hearing, No problems swallowing food or Liquids, Planes of chest pain and shortness of breath  reports nausea and vomiting , Bowel movements are regular, No Blood in stool or  Urine, No dysuria, No new skin rashes or bruises, No new joints pains-aches,  No new weakness, tingling, numbness in any extremity, No recent weight gain or loss, No polyuria, polydypsia or polyphagia, No significant Mental Stressors.  A full 10 point Review of Systems was done, except as stated above, all other Review of Systems were negative.   With Past History of the following :    Past Medical History:  Diagnosis Date   Anemia    Arthritis    Asthma    Bell palsy    CAD (coronary artery disease)    a. 2014 MV: abnl w/ infap ischemia; b. 03/2013 Cath: aneurysmal bleb in the LAD w/ otw nonobs dzs-->Med Rx.   Chronic back pain    Chronic knee pain    a. 09/2015 s/p R TKA.   Chronic pain    Chronic shoulder pain    Chronic sinusitis    CKD (chronic kidney disease), stage IV ([place])    stage 4 per office visit  note of Dr Lowanda Foster on 05/2015    COPD (chronic obstructive pulmonary disease) ([place])    Diabetes mellitus without complication ([place])    type II    Essential hypertension    GERD (gastroesophageal reflux disease)    Gout    Gout    Hepatomegaly    noted on noncontrast CT 2015   History of hiatal hernia    Hyperlipidemia    Lateral meniscus tear    Obesity    Truncal   Obstructive sleep apnea    does not use cpap    On home oxygen therapy    uses 2l when is going somewhere per patient    PUD (peptic ulcer disease)    remote, reports f/u EGD about 8 years ago unremarkable    Reactive airway disease    related to exposure to chemical during 9/11   Renal insufficiency    Sinusitis    Vitamin D deficiency       Past Surgical History:  Procedure Laterality Date   ASAD LT SHOULDER  12/15/2008   left shoulder   AV FISTULA PLACEMENT Left 08/09/2016   Procedure: BRACHIOCEPHALIC ARTERIOVENOUS (AV) FISTULA CREATION LEFT ARM;  Surgeon: [name], MD;  Location: [place];  Service: Vascular;  Laterality: Left;   CAPD INSERTION N/A 10/07/2018   Procedure:  LAPAROSCOPIC PERITONEAL CATHETER PLACEMENT;  Surgeon: Mickeal Skinner, MD;  Location: [facility];  Service: General;  Laterality: N/A;   CATARACT EXTRACTION W/PHACO Left 03/28/2016   Procedure: CATARACT EXTRACTION PHACO AND INTRAOCULAR LENS PLACEMENT LEFT EYE;  Surgeon: [name], MD;  Location: [facility];  Service: Ophthalmology;  Laterality: Left;  CDE: 4.77   CATARACT EXTRACTION W/PHACO Right 04/11/2016   Procedure: CATARACT EXTRACTION PHACO AND INTRAOCULAR LENS PLACEMENT RIGHT EYE; CDE:  4.74;  Surgeon: [name], MD;  Location: [facility];  Service: Ophthalmology;  Laterality: Right;   COLONOSCOPY  10/15/2008   Fields: Rectal polyp obliterated, not retrieved, hemorrhoids, single ascending colon diverticulum near the CV. Next colonoscopy April 2020   COLONOSCOPY N/A 12/25/2014   SLF: 1. Colorectal polyps (2) removed 2. Small internal hemorrhoids 3. the left colon is severely redundant. hyperplastic polyps   DOPPLER ECHOCARDIOGRAPHY     ESOPHAGOGASTRODUODENOSCOPY N/A 12/25/2014   SLF: 1. Anemia most likely due to CRI, gastritis, gastric polyps 2. Moderate non-erosive gastriits and mild duodenitis.  3.TWo large gstric polyps removed.    EYE SURGERY  12/22/2010   tear duct probing-[place]   FOREIGN BODY REMOVAL  03/29/2011   Procedure: REMOVAL FOREIGN BODY EXTREMITY;  Surgeon: Arther Abbott, MD;  Location: [facility];  Service: Orthopedics;  Laterality: Right;  Removal Foreign Body Right Thumb   IR FLUORO GUIDE CV LINE RIGHT  08/06/2018   IR US GUIDE VASC ACCESS RIGHT  08/06/2018   KNEE ARTHROSCOPY  10/16/2007   left   KNEE ARTHROSCOPY WITH LATERAL MENISECTOMY Right 10/14/2015   Procedure: LEFT KNEE ARTHROSCOPY WITH PARTIAL LATERAL MENISECTOMY;  Surgeon: Carole Civil, MD;  [facility];  Service: Orthopedics;  Laterality: Right;   LEFT HEART CATHETERIZATION WITH CORONARY ANGIOGRAM N/A 03/28/2013   Procedure: LEFT HEART CATHETERIZATION WITH CORONARY ANGIOGRAM;  Surgeon: Leonie Man, MD;  Location: [facility] CATH LAB;  Service: Cardiovascular;  Laterality: N/A;   NM MYOVIEW LTD     PENILE PROSTHESIS IMPLANT N/A 08/16/2015   Procedure: PENILE PROTHESIS INFLATABLE, three piece, Excisional biopsy of Penile ulcer, Penile molding;  Surgeon: Carolan Clines, MD;  [facility];  Service:  Urology;  Laterality: N/A;   PENILE PROSTHESIS IMPLANT N/A 12/24/2017   Procedure: REMOVAL AND  REPLACEMENT  COLOPLAST PENILE PROSTHESIS;  Surgeon: Lucas Mallow, MD;  [facility];  Service: Urology;  Laterality: N/A;   QUADRICEPS TENDON REPAIR  07/21/2011   Procedure: REPAIR QUADRICEP TENDON;  Surgeon: Arther Abbott, MD;  Location: [facility];  Service: Orthopedics;  Laterality: Right;   TOENAIL EXCISION     removed [id number]   UMBILICAL HERNIA REPAIR  07/17/2005   [place]      Social History:     Social History   Tobacco Use   Smoking status: Former    Packs/day: 1.00    Years: 25.00    Pack years: 25.00    Types: Cigarettes    Quit date: 03/27/2010    Years since quitting: 11.3   Smokeless tobacco: Never   Tobacco comments:    Quit x 7 years  Substance Use Topics   Alcohol use: Yes    Alcohol/week: 0.0 standard drinks    Comment: occasionally        Family History :     Family History  Problem Relation Age of Onset   Hypertension Mother        MI   Cancer Mother        breast    Diabetes Mother    Diabetes Father    Hypertension Father    Hypertension Sister    Diabetes Sister    Arthritis Other    Asthma Other    Lung disease Other    Anesthesia problems Neg Hx    Hypotension Neg Hx    Malignant hyperthermia Neg Hx    Pseudochol deficiency Neg Hx    Colon cancer Neg Hx      Home Medications:   Prior to Admission medications   Medication Sig Start Date End Date Taking? Authorizing Provider  albuterol (VENTOLIN HFA) 108 (90 Base) MCG/ACT inhaler Inhale 2 puffs into the lungs every 6 (six) hours as needed for wheezing or shortness  of breath. 03/25/21   Rigoberto Noel, MD  amLODipine (NORVASC) 5 MG tablet Take 1 tablet (5 mg total) by mouth daily. 11/14/19   Pixie Casino, MD  aspirin EC 81 MG tablet Take 81 mg by mouth daily. Swallow whole.    [provider]  budesonide-formoterol (SYMBICORT) 160-4.5 MCG/ACT inhaler Inhale 2 puffs into the lungs 2 (two) times daily. 04/07/20   Collene Gobble, MD  calcitRIOL (ROCALTROL) 0.25 MCG capsule Take 0.25 mcg by mouth daily.    [provider]  diclofenac (CATAFLAM) 50 MG tablet Take 1 tablet (50 mg total) by mouth 2 (two) times daily. 04/25/21   Carole Civil, MD  diclofenac (VOLTAREN) 50 MG EC tablet Take 1 tablet (50 mg total) by mouth 2 (two) times daily. 04/25/21   Carole Civil, MD  fluticasone (FLONASE) 50 MCG/ACT nasal spray Place 2 sprays into both nostrils daily. 05/09/19   Evalee Jefferson, PA-C  glucose blood test strip 1 each by Other route 2 (two) times daily. Use as instructed to check blood glucose twice daily 02/22/21   Brita Romp, NP  HYDROcodone-acetaminophen (NORCO/VICODIN) 5-325 MG tablet Take 1 tablet by mouth every 6 (six) hours as needed for moderate pain. Patient not taking: Reported on 07/13/2021 05/09/21   Carole Civil, MD  hydrocortisone (ANUSOL-HC) 2.5 % rectal cream Place 1 application rectally 2 (two) times daily. 07/13/21   Annitta Needs, NP  Insulin Pen Needle (DROPLET PEN NEEDLES) 31G X 5 MM MISC Use to inject insulin daily as directed 12/30/20   Cassandria Anger, MD  LANTUS SOLOSTAR 100 UNIT/ML Solostar Pen INJECT  30 UNITS SUBCUTANEOUSLY AT BEDTIME 12/15/20   Brita Romp, NP  losartan (COZAAR) 100 MG tablet  02/04/20   [provider]  metoprolol succinate (TOPROL-XL) 25 MG 24 hr tablet Take 25 mg by mouth daily.    [provider]  mometasone (NASONEX) 50 MCG/ACT nasal spray Place 2 sprays into the nose 2 (two) times daily as needed (allergies).    [provider]   pantoprazole (PROTONIX) 40 MG tablet TAKE 1 TABLET TWICE DAILY 30 MINUTES PRIOR TO MEALS 08/05/20   Mahala Menghini, PA-C  sevelamer carbonate (RENVELA) 800 MG tablet  12/06/20   [provider]  simvastatin (ZOCOR) 20 MG tablet Take 20 mg by mouth every morning.    [provider]  tiZANidine (ZANAFLEX) 4 MG tablet Take 1 tablet (4 mg total) by mouth every 6 (six) hours as needed for up to 60 doses for muscle spasms. Patient not taking: Reported on 07/13/2021 04/25/21   Carole Civil, MD  torsemide [facility]) 100 MG tablet Take 100 mg by mouth daily. 05/03/21   [provider]  TRADJENTA 5 MG TABS tablet TAKE 1 TABLET EVERY DAY 07/19/21   Brita Romp, NP  Vitamin D, Ergocalciferol, (DRISDOL) 1.25 MG (50000 UNIT) CAPS capsule Take 50,000 Units by mouth every 30 (thirty) days. 05/03/21   [provider]     Allergies:     Allergies  Allergen Reactions   Tramadol Hcl    Opana [Oxymorphone Hcl] Itching   Oxymorphone Itching   Tramadol Itching     Physical Exam:   Vitals  Blood pressure 132/82, pulse 91, temperature 98.2 F (36.8 C), temperature source Oral, resp. rate (!) 24, height 5\' 4"  (1.626 m), weight 104.3 kg, SpO2 97 %.   1. General male, lying in bed in NAD,  2. Normal affect and insight, Not Suicidal or Homicidal, Awake Alert, Oriented X 3.  3. No F.N deficits, ALL C.Nerves Intact, Strength 5/5 all 4 extremities, Sensation intact all 4 extremities, Plantars down going.  4. Ears and Eyes appear Normal, Conjunctivae clear, PERRLA. Moist Oral Mucosa.  5. Supple Neck, No JVD, No cervical lymphadenopathy appriciated, No Carotid Bruits.  6. Symmetrical Chest wall movement, nester entry at the bases  7. RRR, No Gallops, Rubs or Murmurs, No Parasternal Heave.  Trace edema  8. Positive Bowel Sounds, Abdomen Soft, No tenderness, No organomegaly appriciated,No rebound -guarding or rigidity.  9.  No Cyanosis, Normal Skin Turgor, No  Skin Rash or Bruise.  10. Good muscle tone,  joints appear normal , no effusions, Normal ROM.  11. No Palpable Lymph Nodes in Neck or Axillae     Data Review:    CBC Recent Labs  Lab 07/22/21 1416  WBC 5.0  HGB 10.2*  HCT 31.1*  PLT 100*  MCV 94.0  MCH 30.8  MCHC 32.8  RDW 13.4   ------------------------------------------------------------------------------------------------------------------  Chemistries  Recent Labs  Lab 07/22/21 1416  NA 135  K 3.7  CL 92*  CO2 27  GLUCOSE 145*  BUN 59*  CREATININE 17.84*  CALCIUM 9.2   ------------------------------------------------------------------------------------------------------------------ estimated creatinine clearance is 4.1 mL/min (A) (by C-G formula based on SCr of 17.84 mg/dL (H)). ------------------------------------------------------------------------------------------------------------------ No results for input(s): TSH, T4TOTAL, T3FREE, THYROIDAB in the last 72 hours.  Invalid input(s):  FREET3  Coagulation profile No results for input(s): INR, PROTIME in the last 168 hours. ------------------------------------------------------------------------------------------------------------------- No results for input(s): DDIMER in the last 72 hours. -------------------------------------------------------------------------------------------------------------------  Cardiac Enzymes No results for input(s): CKMB, TROPONINI, MYOGLOBIN in the last 168 hours.  Invalid input(s): CK ------------------------------------------------------------------------------------------------------------------ No results found for: BNP   ---------------------------------------------------------------------------------------------------------------  Urinalysis    Component Value Date/Time   COLORURINE YELLOW 01/15/2014 [place] 01/15/2014 2320   LABSPEC 1.010 01/15/2014 2320   PHURINE 5.5 01/15/2014 2320    GLUCOSEU NEGATIVE 01/15/2014 2320   HGBUR TRACE (A) 01/15/2014 2320   BILIRUBINUR NEGATIVE 01/15/2014 2320   KETONESUR NEGATIVE 01/15/2014 2320   PROTEINUR 30 (A) 01/15/2014 2320   UROBILINOGEN 0.2 01/15/2014 2320   NITRITE NEGATIVE 01/15/2014 2320   LEUKOCYTESUR NEGATIVE 01/15/2014 2320    ----------------------------------------------------------------------------------------------------------------   Imaging Results:    DG Chest Port 1 View  Result Date: 07/22/2021 CLINICAL DATA:  chest pain EXAM: PORTABLE CHEST 1 VIEW COMPARISON:  August 29, 2018. FINDINGS: No consolidation. No visible pleural effusions or pneumothorax. Cardiomediastinal silhouette is within normal limits. No evidence of acute osseous abnormality. IMPRESSION: No evidence of acute cardiopulmonary disease. Electronically Signed   By: Margaretha Sheffield M.D.   On: 07/22/2021 14:31    My personal review of EKG: Rhythm NSR, Rate  58 /min, QTc 396   Assessment & Plan:    Principal Problem:   NSTEMI (non-ST elevated myocardial infarction) ([place]) Active Problems:   Hyperlipidemia   CAD (coronary artery disease)   Coronary artery disease involving native coronary artery of native heart without angina pectoris   End stage renal disease (H[facility])   Chest pain  NSTEMI -Patient presents with chest pain, typical for CAD, and significantly elevated troponin 3248 on presentation -Cardiology input greatly appreciated, received full dose aspirin in ED, currently chest pain-free -Continue with aspirin 81 mg oral daily, started on heparin drip ACS protocol, continue with as needed sublingual nitro, continue with Toprol-XL 25 mg daily, continue with statin, continue to trend troponins -We will obtain 2D echo. -We will [facility] to [facility] per cardiology recommendation  Hypertension -Blood pressure is acceptable, continue with losartan and Toprol-XL, hold Norvasc  Hyperlipidemia -Will check LDL, it was 53 on 06/10/2021,  continue with home dose simvastatin  ESRD -He is on PD, renal consulted, they will see when patient gets to [facility], awaiting bed availability, if patient does not get there this evening, there is no emergent need for peritoneal dialysis tonight as discussed with renal.    Diabetes mellitus, insulin-dependent -We will continue with Lantus at a lower dose, continue at 25 units at bedtime(30 units at home), continue with insulin sliding scale -Hold Tradjenta  History of COPD -No active wheezing, continue with Symbicort  GERD -Continue with PPI    DVT Prophylaxis Heparin GTT  AM Labs Ordered, also please review Full Orders  Family Communication: Admission, patients condition and plan of care including tests being ordered have been discussed with the patient and wife* who indicate understanding and agree with the plan and Code Status.  Code Status FULL  Likely DC to  HOME  Condition GUARDED    Consults called: Seen by cardiology at [facility] ED, consulted as well, they will see patient when he gets to [facility], but no need for emergent PD tonight  Admission status: INPATIENT  Time spent in minutes : 65 MINUTES   Phillips Climes M.D on 07/22/2021 at 5:19 PM   [facility] -  Office  [phone]

## 2021-07-22 NOTE — Telephone Encounter (Signed)
° °  Pt c/o of Chest Pain: STAT if CP now or developed within 24 hours  1. Are you having CP right now? Yes   2. Are you experiencing any other symptoms (ex. SOB, nausea, vomiting, sweating)? Vomit, SOB, sweating   3. How long have you been experiencing CP? Started yesterday   4. Is your CP continuous or coming and going? continuous   5. Have you taken Nitroglycerin? No   ?

## 2021-07-23 ENCOUNTER — Inpatient Hospital Stay (HOSPITAL_COMMUNITY): Payer: Medicare Other

## 2021-07-23 DIAGNOSIS — I214 Non-ST elevation (NSTEMI) myocardial infarction: Secondary | ICD-10-CM | POA: Diagnosis not present

## 2021-07-23 DIAGNOSIS — K219 Gastro-esophageal reflux disease without esophagitis: Secondary | ICD-10-CM

## 2021-07-23 DIAGNOSIS — E669 Obesity, unspecified: Secondary | ICD-10-CM

## 2021-07-23 DIAGNOSIS — J449 Chronic obstructive pulmonary disease, unspecified: Secondary | ICD-10-CM

## 2021-07-23 DIAGNOSIS — R079 Chest pain, unspecified: Secondary | ICD-10-CM

## 2021-07-23 DIAGNOSIS — E1121 Type 2 diabetes mellitus with diabetic nephropathy: Secondary | ICD-10-CM

## 2021-07-23 DIAGNOSIS — N186 End stage renal disease: Secondary | ICD-10-CM | POA: Diagnosis not present

## 2021-07-23 DIAGNOSIS — I2510 Atherosclerotic heart disease of native coronary artery without angina pectoris: Secondary | ICD-10-CM | POA: Diagnosis not present

## 2021-07-23 DIAGNOSIS — E782 Mixed hyperlipidemia: Secondary | ICD-10-CM

## 2021-07-23 LAB — CBG MONITORING, ED
Glucose-Capillary: 77 mg/dL (ref 70–99)
Glucose-Capillary: 102 mg/dL — ABNORMAL HIGH (ref 70–99)

## 2021-07-23 LAB — CBC
HCT: 28.9 % — ABNORMAL LOW (ref 39.0–52.0)
Hemoglobin: 9.5 g/dL — ABNORMAL LOW (ref 13.0–17.0)
MCH: 30.5 pg (ref 26.0–34.0)
MCHC: 32.9 g/dL (ref 30.0–36.0)
MCV: 92.9 fL (ref 80.0–100.0)
Platelets: 83 10*3/uL — ABNORMAL LOW (ref 150–400)
RBC: 3.11 MIL/uL — ABNORMAL LOW (ref 4.22–5.81)
RDW: 13.3 % (ref 11.5–15.5)
WBC: 4.3 10*3/uL (ref 4.0–10.5)
nRBC: 0 % (ref 0.0–0.2)

## 2021-07-23 LAB — HEPARIN LEVEL (UNFRACTIONATED)
Heparin Unfractionated: 0.55 IU/mL (ref 0.30–0.70)
Heparin Unfractionated: 0.54 IU/mL (ref 0.30–0.70)

## 2021-07-23 LAB — ECHOCARDIOGRAM COMPLETE
AR max vel: 1.93 cm2
AV Area VTI: 2.02 cm2
AV Area mean vel: 1.94 cm2
AV Mean grad: 10.5 mmHg
AV Peak grad: 20.5 mmHg
Ao pk vel: 2.27 m/s
Area-P 1/2: 5.62 cm2
Calc EF: 55 %
Height: 64 in
MV VTI: 2.52 cm2
S' Lateral: 2.9 cm
Single Plane A2C EF: 59.5 %
Single Plane A4C EF: 53.8 %
Weight: 3680 oz

## 2021-07-23 LAB — GLUCOSE, CAPILLARY
Glucose-Capillary: 86 mg/dL (ref 70–99)
Glucose-Capillary: 86 mg/dL (ref 70–99)

## 2021-07-23 MED ORDER — CHLORHEXIDINE GLUCONATE CLOTH 2 % EX PADS
6.0000 | MEDICATED_PAD | Freq: Every day | CUTANEOUS | Status: DC
  Administered 2021-07-23 – 2021-07-26 (×4): 6 via TOPICAL

## 2021-07-23 MED ORDER — IPRATROPIUM-ALBUTEROL 20-100 MCG/ACT IN AERS
1.0000 | INHALATION_SPRAY | Freq: Four times a day (QID) | RESPIRATORY_TRACT | Status: DC | PRN
  Filled 2021-07-23: qty 4

## 2021-07-23 MED ORDER — GENTAMICIN SULFATE 0.1 % EX CREA
1.0000 "application " | TOPICAL_CREAM | Freq: Every day | CUTANEOUS | Status: DC
  Administered 2021-07-24 – 2021-07-27 (×4): 1 via TOPICAL
  Filled 2021-07-23 (×2): qty 15

## 2021-07-23 MED ORDER — DELFLEX-LC/2.5% DEXTROSE 394 MOSM/L IP SOLN: INTRAPERITONEAL | Status: DC

## 2021-07-23 MED ORDER — HEPARIN 1000 UNIT/ML FOR PERITONEAL DIALYSIS
INTRAPERITONEAL | Status: DC | PRN
  Filled 2021-07-23 (×2): qty 5000

## 2021-07-23 MED ORDER — HEPARIN 1000 UNIT/ML FOR PERITONEAL DIALYSIS: 500.0000 [IU] | INTRAMUSCULAR | Status: DC | PRN

## 2021-07-23 MED ORDER — DELFLEX-LC/2.5% DEXTROSE 394 MOSM/L IP SOLN
INTRAPERITONEAL | Status: DC
  Administered 2021-07-25: 5000 mL via INTRAPERITONEAL
  Administered 2021-07-26: 10000 mL via INTRAPERITONEAL

## 2021-07-23 MED ORDER — HEPARIN BOLUS VIA INFUSION
2000.0000 [IU] | Freq: Once | INTRAVENOUS | Status: AC
  Administered 2021-07-23: 2000 [IU] via INTRAVENOUS

## 2021-07-23 NOTE — Consult Note (Addendum)
[facility] Renal Consultation Note    Indication for Consultation:  Management of ESRD/hemodialysis; anemia, hypertension/volume and secondary hyperparathyroidism  LPF:XTKWI, Tesfaye, MD  HPI: [name] [name] is a 71 y.o. male. ESRD on [facility] at [facility], first starting on Aug 01, 2017.  Past medical history significant for CAD, IDDM, HLD, HTN, morbid obesity and OSA.    Patient transferred from [facility] due to NSTEMI with need for cardiac cath.  Reports intermittent chest pain for over a week which he thought was due to a pulled muscle.  States of Thursday even he had severe pain with vomiting after carrying a load wood into his basement.  Yesterday he called his cardiologist who recommended going to the ED for further evaluation.    Today he denies CP, SOB, abdominal pain, weakness, dizziness, fatigue, abdominal pain, fever, chills and n/v/d.  Reports intermittent SOB and cough for about 2 weeks.  States his daughter had COVID a month ago but he and his family all tested negative.  No other known COVID contacts. Pertinent findings in work up include +COVID 19, troponin 3,386, K 3.7, BUN 59, Scr 17.84, CXR with no evidence of cardiopulmonary disease and ECHO with EF 55-60% and mild MVR.  Past Medical History:  Diagnosis Date   Anemia    Arthritis    Asthma    Bell palsy    CAD (coronary artery disease)    a. 2014 MV: abnl w/ infap ischemia; b. 03/2013 Cath: aneurysmal bleb in the LAD w/ otw nonobs dzs-->Med Rx.   Chronic back pain    Chronic knee pain    a. 09/2015 s/p R TKA.   Chronic pain    Chronic shoulder pain    Chronic sinusitis    CKD (chronic kidney disease), stage IV ([place])    stage 4 per office visit note of Dr Lowanda  on 05/2015    COPD (chronic obstructive pulmonary disease) ([place])    Diabetes mellitus without complication ([place])    type II    Essential hypertension    GERD (gastroesophageal reflux disease)    Gout    Gout    Hepatomegaly     noted on noncontrast CT 2015   History of hiatal hernia    Hyperlipidemia    Lateral meniscus tear    Obesity    Truncal   Obstructive sleep apnea    does not use cpap    On home oxygen therapy    uses 2l when is going somewhere per patient    PUD (peptic ulcer disease)    remote, reports f/u EGD about 8 years ago unremarkable    Reactive airway disease    related to exposure to chemical during 9/11   Renal insufficiency    Sinusitis    Vitamin D deficiency    Past Surgical History:  Procedure Laterality Date   ASAD LT SHOULDER  12/15/2008   left shoulder   AV FISTULA PLACEMENT Left 08/09/2016   Procedure: BRACHIOCEPHALIC ARTERIOVENOUS (AV) FISTULA CREATION LEFT ARM;  Surgeon: [name], MD;  Location: [facility];  Service: Vascular;  Laterality: Left;   CAPD INSERTION N/A 10/07/2018   Procedure: LAPAROSCOPIC PERITONEAL CATHETER PLACEMENT;  Surgeon: Mickeal Skinner, MD;  Location: [facility];  Service: General;  Laterality: N/A;   CATARACT EXTRACTION W/PHACO Left 03/28/2016   Procedure: CATARACT EXTRACTION PHACO AND INTRAOCULAR LENS PLACEMENT LEFT EYE;  Surgeon: [name], MD;  Location: [facility];  Service: Ophthalmology;  Laterality: Left;  CDE:  4.77   CATARACT EXTRACTION W/PHACO Right 04/11/2016   Procedure: CATARACT EXTRACTION PHACO AND INTRAOCULAR LENS PLACEMENT RIGHT EYE; CDE:  4.74;  Surgeon: [name], MD;  Location: [facility];  Service: Ophthalmology;  Laterality: Right;   COLONOSCOPY  10/15/2008   Fields: Rectal polyp obliterated, not retrieved, hemorrhoids, single ascending colon diverticulum near the CV. Next colonoscopy April 2020   COLONOSCOPY N/A 12/25/2014   SLF: 1. Colorectal polyps (2) removed 2. Small internal hemorrhoids 3. the left colon is severely redundant. hyperplastic polyps   DOPPLER ECHOCARDIOGRAPHY     ESOPHAGOGASTRODUODENOSCOPY N/A 12/25/2014   SLF: 1. Anemia most likely due to CRI, gastritis, gastric polyps 2. Moderate non-erosive gastriits and  mild duodenitis.  3.TWo large gstric polyps removed.    EYE SURGERY  12/22/2010   tear duct probing-[place]   FOREIGN BODY REMOVAL  03/29/2011   Procedure: REMOVAL FOREIGN BODY EXTREMITY;  Surgeon: Arther Abbott, MD;  Location: [facility];  Service: Orthopedics;  Laterality: Right;  Removal Foreign Body Right Thumb   IR FLUORO GUIDE CV LINE RIGHT  08/06/2018   IR US GUIDE VASC ACCESS RIGHT  08/06/2018   KNEE ARTHROSCOPY  10/16/2007   left   KNEE ARTHROSCOPY WITH LATERAL MENISECTOMY Right 10/14/2015   Procedure: LEFT KNEE ARTHROSCOPY WITH PARTIAL LATERAL MENISECTOMY;  Surgeon: Carole Civil, MD;  Location: [facility];  Service: Orthopedics;  Laterality: Right;   LEFT HEART CATHETERIZATION WITH CORONARY ANGIOGRAM N/A 03/28/2013   Procedure: LEFT HEART CATHETERIZATION WITH CORONARY ANGIOGRAM;  Surgeon: Leonie Man, MD;  Location: [facility] CATH [facility];  Service: Cardiovascular;  Laterality: N/A;   NM MYOVIEW LTD     PENILE PROSTHESIS IMPLANT N/A 08/16/2015   Procedure: PENILE PROTHESIS INFLATABLE, three piece, Excisional biopsy of Penile ulcer, Penile molding;  Surgeon: Carolan Clines, MD;  Location: [facility];  Service: Urology;  Laterality: N/A;   PENILE PROSTHESIS IMPLANT N/A 12/24/2017   Procedure: REMOVAL AND  REPLACEMENT  COLOPLAST PENILE PROSTHESIS;  Surgeon: Lucas Mallow, MD;  Location: [facility];  Service: Urology;  Laterality: N/A;   QUADRICEPS TENDON REPAIR  07/21/2011   Procedure: REPAIR QUADRICEP TENDON;  Surgeon: Arther Abbott, MD;  Location: [facility];  Service: Orthopedics;  Laterality: Right;   TOENAIL EXCISION     removed [id number]   UMBILICAL HERNIA REPAIR  07/17/2005   [place]   Family History  Problem Relation Age of Onset   Hypertension Mother        MI   Cancer Mother        breast    Diabetes Mother    Diabetes Father    Hypertension Father    Hypertension Sister    Diabetes Sister    Arthritis Other    Asthma Other    Lung disease Other    Anesthesia  problems Neg Hx    Hypotension Neg Hx    Malignant hyperthermia Neg Hx    Pseudochol deficiency Neg Hx    Colon cancer Neg Hx    Social History:  reports that he quit smoking about 11 years ago. His smoking use included cigarettes. He has a 25.00 pack-year smoking history. He has never used smokeless tobacco. He reports current alcohol use. He reports that he does not currently use drugs after having used the following drugs: Marijuana. Allergies  Allergen Reactions   Tramadol Hcl    Opana [Oxymorphone Hcl] Itching   Oxymorphone Itching   Tramadol Itching   Prior to Admission medications   Medication Sig Start Date  End Date Taking? Authorizing Provider  albuterol (VENTOLIN HFA) 108 (90 Base) MCG/ACT inhaler Inhale 2 puffs into the lungs every 6 (six) hours as needed for wheezing or shortness of breath. 03/25/21  Yes Rigoberto Noel, MD  aspirin EC 81 MG tablet Take 81 mg by mouth daily. Swallow whole.   Yes [provider]  budesonide-formoterol (SYMBICORT) 160-4.5 MCG/ACT inhaler Inhale 2 puffs into the lungs 2 (two) times daily. 04/07/20  Yes Collene Gobble, MD  calcitRIOL (ROCALTROL) 0.25 MCG capsule Take 0.25 mcg by mouth daily.   Yes [provider]  clobetasol (TEMOVATE) 0.05 % external solution Apply topically. 07/19/21  Yes [provider]  hydrocortisone (ANUSOL-HC) 2.5 % rectal cream Place 1 application rectally 2 (two) times daily. 07/13/21  Yes Annitta Needs, NP  LANTUS SOLOSTAR 100 UNIT/ML Solostar Pen INJECT  30 UNITS SUBCUTANEOUSLY AT BEDTIME Patient taking differently: 30 Units at bedtime. 12/15/20  Yes Reardon, Juanetta Beets, NP  losartan (COZAAR) 50 MG tablet Take 50 mg by mouth daily. 05/14/21  Yes [provider]  metoprolol succinate (TOPROL-XL) 25 MG 24 hr tablet Take 25 mg by mouth daily.   Yes [provider]  mometasone (NASONEX) 50 MCG/ACT nasal spray Place 2 sprays into the nose 2 (two) times daily as needed (allergies).   Yes  [provider]  pantoprazole (PROTONIX) 40 MG tablet TAKE 1 TABLET TWICE DAILY 30 MINUTES PRIOR TO MEALS Patient taking differently: Take 40 mg by mouth 2 (two) times daily. 08/05/20  Yes Mahala Menghini, PA-C  simvastatin (ZOCOR) 20 MG tablet Take 20 mg by mouth every morning.   Yes [provider]  torsemide (DEMADEX) 100 MG tablet Take 100 mg by mouth daily. 05/03/21  Yes [provider]  TRADJENTA 5 MG TABS tablet TAKE 1 TABLET EVERY DAY 07/19/21  Yes Reardon, Juanetta Beets, NP  Vitamin D, Ergocalciferol, (DRISDOL) 1.25 MG (50000 UNIT) CAPS capsule Take 50,000 Units by mouth every 30 (thirty) days. 05/03/21  Yes [provider]  amLODipine (NORVASC) 5 MG tablet Take 1 tablet (5 mg total) by mouth daily. Patient not taking: Reported on 07/22/2021 11/14/19   Pixie Casino, MD  diclofenac (CATAFLAM) 50 MG tablet Take 1 tablet (50 mg total) by mouth 2 (two) times daily. Patient not taking: Reported on 07/22/2021 04/25/21   Carole Civil, MD  fluticasone [facility]) 50 MCG/ACT nasal spray Place 2 sprays into both nostrils daily. Patient not taking: Reported on 07/22/2021 05/09/19   Evalee Jefferson, PA-C  glucose blood test strip 1 each by Other route 2 (two) times daily. Use as instructed to check blood glucose twice daily 02/22/21   Brita Romp, NP  HYDROcodone-acetaminophen (NORCO/VICODIN) 5-325 MG tablet Take 1 tablet by mouth every 6 (six) hours as needed for moderate pain. Patient not taking: Reported on 07/13/2021 05/09/21   Carole Civil, MD  Insulin Pen Needle (DROPLET PEN NEEDLES) 31G X 5 MM MISC Use to inject insulin daily as directed 12/30/20   Cassandria Anger, MD  tiZANidine (ZANAFLEX) 4 MG tablet Take 1 tablet (4 mg total) by mouth every 6 (six) hours as needed for up to 60 doses for muscle spasms. Patient not taking: Reported on 07/13/2021 04/25/21   Carole Civil, MD   Current Facility-Administered Medications  Medication Dose Route  Frequency Provider Last Rate Last Admin   acetaminophen (TYLENOL) tablet 650 mg  650 mg Oral Q4H PRN Elgergawy, Silver Huguenin, MD  aspirin EC tablet 81 mg  81 mg Oral Daily Elgergawy, Silver Huguenin, MD   81 mg at 07/23/21 1111   Chlorhexidine Gluconate Cloth 2 % PADS 6 each  6 each Topical Daily Elgergawy, Silver Huguenin, MD       dialysis solution 2.5% low-MG/low-CA dianeal solution   Intraperitoneal Q24H Penninger, Ria Comment, [place]       gentamicin cream (GARAMYCIN) 0.1 % 1 application  1 application Topical Daily Penninger, Ria Comment, PA       heparin 1000 unit/ml injection 500 Units  500 Units Intraperitoneal PRN Penninger, Ria Comment, PA       heparin ADULT infusion 100 units/mL (25000 units/221mL)  1,200 Units/hr Intravenous Continuous Laren Everts, RPH 12 mL/hr at 07/23/21 0949 1,200 Units/hr at 07/23/21 0949   insulin aspart (novoLOG) injection 0-5 Units  0-5 Units Subcutaneous QHS Elgergawy, Silver Huguenin, MD       insulin aspart (novoLOG) injection 0-9 Units  0-9 Units Subcutaneous TID WC Elgergawy, Silver Huguenin, MD       insulin detemir (LEVEMIR) injection 20 Units  20 Units Subcutaneous QHS Elgergawy, Silver Huguenin, MD   20 Units at 07/22/21 2225   Ipratropium-Albuterol (COMBIVENT) respimat 1 puff  1 puff Inhalation Q6H PRN Barton Dubois, MD       losartan (COZAAR) tablet 50 mg  50 mg Oral Daily Elgergawy, Silver Huguenin, MD   50 mg at 07/23/21 1109   metoprolol succinate (TOPROL-XL) 24 hr tablet 25 mg  25 mg Oral Daily Elgergawy, Silver Huguenin, MD   25 mg at 07/23/21 1110   mometasone-formoterol (DULERA) 200-5 MCG/ACT inhaler 2 puff  2 puff Inhalation BID Elgergawy, Silver Huguenin, MD   2 puff at 07/23/21 0814   nitroGLYCERIN (NITROSTAT) SL tablet 0.4 mg  0.4 mg Sublingual Q5 Min x 3 PRN Elgergawy, Silver Huguenin, MD       ondansetron (ZOFRAN) injection 4 mg  4 mg Intravenous Q6H PRN Elgergawy, Silver Huguenin, MD       pantoprazole (PROTONIX) EC tablet 40 mg  40 mg Oral BID AC Elgergawy, Silver Huguenin, MD   40 mg at 07/23/21 0810   simvastatin  (ZOCOR) tablet 20 mg  20 mg Oral q morning Elgergawy, Silver Huguenin, MD   20 mg at 07/23/21 1120   torsemide (DEMADEX) tablet 100 mg  100 mg Oral Daily Elgergawy, Silver Huguenin, MD   100 mg at 07/23/21 1110   Labs: Basic Metabolic Panel: Recent Labs  Lab 07/22/21 1416  NA 135  K 3.7  CL 92*  CO2 27  GLUCOSE 145*  BUN 59*  CREATININE 17.84*  CALCIUM 9.2   CBC: Recent Labs  Lab 07/22/21 1416 07/23/21 0532  WBC 5.0 4.3  HGB 10.2* 9.5*  HCT 31.1* 28.9*  MCV 94.0 92.9  PLT 100* 83*   CBG: Recent Labs  Lab 07/22/21 1838 07/22/21 2221 07/23/21 0809 07/23/21 1314  GLUCAP 72 95 77 102*   Studies/Results: DG Chest Port 1 View  Result Date: 07/22/2021 CLINICAL DATA:  chest pain EXAM: PORTABLE CHEST 1 VIEW COMPARISON:  August 29, 2018. FINDINGS: No consolidation. No visible pleural effusions or pneumothorax. Cardiomediastinal silhouette is within normal limits. No evidence of acute osseous abnormality. IMPRESSION: No evidence of acute cardiopulmonary disease. Electronically Signed   By: Margaretha Sheffield M.D.   On: 07/22/2021 14:31   ECHOCARDIOGRAM COMPLETE  Result Date: 07/23/2021    ECHOCARDIOGRAM REPORT   Patient Name:   MOHAMADOU MACIVER Date of Exam: 07/23/2021 Medical Rec #:  [id number]  Height:       64.0 in Accession #:    [id number]     Weight:       230.0 lb Date of Birth:  07/17/1951     BSA:          2.076 m Patient Age:    36 years       BP:           122/61 mmHg Patient Gender: M              HR:           83 bpm. Exam Location:  [facility] Procedure: 2D Echo, Cardiac Doppler and Color Doppler Indications:    Chest Pain  History:        Patient has prior history of Echocardiogram examinations, most                 recent 08/29/2018. CAD and Previous Myocardial Infarction,                 Signs/Symptoms:Chest Pain; Risk Factors:Hypertension, Diabetes,                 Dyslipidemia and Former Smoker. COVID +.  Sonographer:    Wenda Low Referring Phys: [place]  1. Left ventricular ejection fraction, by estimation, is 55 to 60%. The left ventricle has normal function. The left ventricle has no regional wall motion abnormalities. There is severe left ventricular hypertrophy. Left ventricular diastolic parameters  are consistent with Grade I diastolic dysfunction (impaired relaxation). Elevated left atrial pressure.  2. Right ventricular systolic function is normal. The right ventricular size is normal. There is normal pulmonary artery systolic pressure.  3. Left atrial size was mildly dilated.  4. Mild mitral valve regurgitation.  5. AV is thickened with minimally restricted motion. Peak and mean gradients through the valve aer 19 and 10 mm Hg respectively. . The aortic valve is tricuspid. Aortic valve regurgitation is trivial. Aortic valve sclerosis is present, with no evidence of aortic valve stenosis. FINDINGS  Left Ventricle: Left ventricular ejection fraction, by estimation, is 55 to 60%. The left ventricle has normal function. The left ventricle has no regional wall motion abnormalities. The left ventricular internal cavity size was normal in size. There is  severe left ventricular hypertrophy. Left ventricular diastolic parameters are consistent with Grade I diastolic dysfunction (impaired relaxation). Elevated left atrial pressure. Right Ventricle: The right ventricular size is normal. Right vetricular wall thickness was not assessed. Right ventricular systolic function is normal. There is normal pulmonary artery systolic pressure. The tricuspid regurgitant velocity is 2.73 m/s, and with an assumed right atrial pressure of 3 mmHg, the estimated right ventricular systolic pressure is 42.7 mmHg. Left Atrium: Left atrial size was mildly dilated. Right Atrium: Right atrial size was normal in size. Pericardium: Trivial pericardial effusion is present. Mitral Valve: There is mild thickening of the mitral valve leaflet(s). Mild mitral annular calcification.  Mild mitral valve regurgitation. MV peak gradient, 8.8 mmHg. The mean mitral valve gradient is 4.0 mmHg. Tricuspid Valve: The tricuspid valve is normal in structure. Tricuspid valve regurgitation is trivial. Aortic Valve: AV is thickened with minimally restricted motion. Peak and mean gradients through the valve aer 19 and 10 mm Hg respectively. The aortic valve is tricuspid. Aortic valve regurgitation is trivial. Aortic valve sclerosis is present, with no evidence of aortic valve stenosis. Aortic valve mean gradient measures 10.5 mmHg. Aortic valve peak gradient measures 20.5 mmHg. Aortic  valve area, by VTI measures 2.02 cm. Pulmonic Valve: The pulmonic valve was normal in structure. Pulmonic valve regurgitation is not visualized. Aorta: The aortic root and ascending aorta are structurally normal, with no evidence of dilitation. IAS/Shunts: No atrial level shunt detected by color flow Doppler.  LEFT VENTRICLE PLAX 2D LVIDd:         4.20 cm     Diastology LVIDs:         2.90 cm     LV e' medial:    5.50 cm/s LV PW:         2.03 cm     LV E/e' medial:  17.6 LV IVS:        1.80 cm     LV e' lateral:   5.87 cm/s LVOT diam:     2.00 cm     LV E/e' lateral: 16.5 LV SV:         88 LV SV Index:   43 LVOT Area:     3.14 cm  LV Volumes (MOD) LV vol d, MOD A2C: 72.1 ml LV vol d, MOD A4C: 88.9 ml LV vol s, MOD A2C: 29.2 ml LV vol s, MOD A4C: 41.1 ml LV SV MOD A2C:     42.9 ml LV SV MOD A4C:     88.9 ml LV SV MOD BP:      44.8 ml RIGHT VENTRICLE RV Basal diam:  3.30 cm RV Mid diam:    3.00 cm RV S prime:     11.49 cm/s TAPSE (M-mode): 2.9 cm LEFT ATRIUM             Index        RIGHT ATRIUM           Index LA diam:        5.20 cm 2.50 cm/m   RA Area:     20.00 cm LA Vol (A2C):   65.5 ml 31.55 ml/m  RA Volume:   49.60 ml  23.89 ml/m LA Vol (A4C):   79.3 ml 38.20 ml/m LA Biplane Vol: 74.4 ml 35.84 ml/m  AORTIC VALVE                     PULMONIC VALVE AV Area (Vmax):    1.93 cm      PV Vmax:       1.07 m/s AV Area  (Vmean):   1.94 cm      PV Peak grad:  4.6 mmHg AV Area (VTI):     2.02 cm AV Vmax:           226.50 cm/s AV Vmean:          149.500 cm/s AV VTI:            0.436 m AV Peak Grad:      20.5 mmHg AV Mean Grad:      10.5 mmHg LVOT Vmax:         139.00 cm/s LVOT Vmean:        92.200 cm/s LVOT VTI:          0.281 m LVOT/AV VTI ratio: 0.64  AORTA Ao Root diam: 2.80 cm Ao Asc diam:  3.40 cm MITRAL VALVE                TRICUSPID VALVE MV Area (PHT): 5.62 cm     TR Peak grad:   29.8 mmHg MV Area VTI:   2.52 cm     TR Vmax:  273.00 cm/s MV Peak grad:  8.8 mmHg MV Mean grad:  4.0 mmHg     SHUNTS MV Vmax:       1.48 m/s     Systemic VTI:  0.28 m MV Vmean:      87.3 cm/s    Systemic Diam: 2.00 cm MV Decel Time: 135 msec MV E velocity: 96.80 cm/s MV A velocity: 130.00 cm/s MV E/A ratio:  0.74 Dorris Carnes MD Electronically signed by Dorris Carnes MD Signature Date/Time: 07/23/2021/1:57:30 PM    Final     ROS: All others negative except those listed in HPI.  Physical Exam: Vitals:   07/23/21 0805 07/23/21 1110 07/23/21 1430 07/23/21 1527  BP: 122/76 133/64 (!) 148/97 (!) 141/99  Pulse: 88 91 83 78  Resp: (!) 27 19 19  (!) 22  Temp: 99.2 F (37.3 C)  98.2 F (36.8 C) 99 F (37.2 C)  TempSrc: Oral  Oral Oral  SpO2: 96% 93% 95% 97%  Weight:    105.4 kg  Height:    5\' 4"  (1.626 m)     General: WDWN male in NAD Head: NCAT, sclera not icteric, +periorbital edema, MMM Neck: Supple. No lymphadenopathy Lungs: CTA bilaterally. No wheeze, rales or rhonchi. Breathing is unlabored. Heart: RRR. No murmur, rubs or gallops.  Abdomen: soft, nontender, +BS, no guarding, no rebound tenderness Lower extremities:trace edema, no ischemic changes, or open wounds  Neuro: AAOx3. Moves all extremities spontaneously. Psych:  Responds to questions appropriately with a normal affect. Dialysis Access: PD catheter  Dialysis Orders:  [facility]  5 cycles EDW 104kg  Assessment/Plan:  NSTEMI - Hx CAD. plan for LHC.   Per cardiology COVID + - tested positive yesterday. Mostly asymptomatic. Per PMD.   ESRD -  on PD.  Orders written for PD tonight.  Tried to contact OP center for home orders but no one available to provide information.  Will get orders when able.   Hypertension/volume  - Blood pressure well controlled, continue home meds.  +facial edema, using 2.5% bags with HD tonight, will adjust orders as indicated.  Anemia of CKD - Hgb 9.5. Will request outpatient orders.  Secondary Hyperparathyroidism -  Ca in goal. Check phos.  Continue home meds.  Nutrition - Renal diet w/fluid restrictions.  GERD Insulin dependent diabetes   Jen Mow, PA-C [facility] 07/23/2021, 4:16 PM     Seen and examined independently.  Agree with note and exam as documented above by physician extender and as noted here.  ESRD patient Followed by Theressa Stamps presented with chest pain and shortness of breath. Found to have NSTEMI. Transferred to [facility] as on PD.  Usually two 2.5% bags and a 1.5% bag as well as icodextran last bag fill of 1800 mL.  He feels better today.  He didn't realize he had covid until our PA at [facility] let him know today. No fevers no abd pain or n/v.  He's never had peritonitis and PD fluid is clear.   General adult male in bed in no acute distress HEENT normocephalic atraumatic extraocular movements intact sclera anicteric; periorbital edema  Neck supple trachea midline Lungs normal work of breathing at rest on room air Abdomen soft nontender nondistended Extremities no edema lower extremities Psych normal mood and affect Neuro alert and oriented x 3 provides hx and follows commands Access: tenckhoff catheter in place bandaged; LUE AVF present with thrill   NSTEMI - per cardiology   Covid positive - per primary team  ESRD on PD - continue PD here all 2.5% exchanges tonight   HTN - UF with PD as above  Anemia CKD - will need to request outpatient records. Hold ESA in  setting of NSTEMI  Secondary hyperpara/metabolic bone disease - will need to request outpatient records  Claudia Desanctis, MD 07/23/2021  5:49 PM

## 2021-07-23 NOTE — ED Notes (Signed)
Torsemide requested from pharmacy, insufficient quantity available

## 2021-07-23 NOTE — Progress Notes (Signed)
ANTICOAGULATION CONSULT NOTE -   Pharmacy Consult for Heparin Indication: chest pain/ACS  Allergies  Allergen Reactions   Tramadol Hcl    Opana [Oxymorphone Hcl] Itching   Oxymorphone Itching   Tramadol Itching    Patient Measurements: Height: 5\' 4"  (162.6 cm) Weight: 105.4 kg (232 lb 4.8 oz) IBW/kg (Calculated) : 59.2 HEPARIN DW (KG): 83.4   Vital Signs: Temp: 99 F (37.2 C) (01/07 1527) Temp Source: Oral (01/07 1527) BP: 141/99 (01/07 1527) Pulse Rate: 78 (01/07 1527)  Labs: Recent Labs    07/22/21 1416 07/22/21 1600 07/22/21 2329 07/23/21 0532 07/23/21 0842 07/23/21 1625  HGB 10.2*  --   --  9.5*  --   --   HCT 31.1*  --   --  28.9*  --   --   PLT 100*  --   --  83*  --   --   HEPARINUNFRC  --   --  0.20*  --  0.55 0.54  CREATININE 17.84*  --   --   --   --   --   TROPONINIHS 3,428* 3,386*  --   --   --   --      Estimated Creatinine Clearance: 4.2 mL/min (A) (by C-G formula based on SCr of 17.84 mg/dL (H)).   Medical History: Past Medical History:  Diagnosis Date   Anemia    Arthritis    Asthma    Bell palsy    CAD (coronary artery disease)    a. 2014 MV: abnl w/ infap ischemia; b. 03/2013 Cath: aneurysmal bleb in the LAD w/ otw nonobs dzs-->Med Rx.   Chronic back pain    Chronic knee pain    a. 09/2015 s/p R TKA.   Chronic pain    Chronic shoulder pain    Chronic sinusitis    CKD (chronic kidney disease), stage IV (HCC)    stage 4 per office visit note of Dr Lowanda Foster on 05/2015    COPD (chronic obstructive pulmonary disease) ([place])    Diabetes mellitus without complication ([place])    type II    Essential hypertension    GERD (gastroesophageal reflux disease)    Gout    Gout    Hepatomegaly    noted on noncontrast CT 2015   History of hiatal hernia    Hyperlipidemia    Lateral meniscus tear    Obesity    Truncal   Obstructive sleep apnea    does not use cpap    On home oxygen therapy    uses 2l when is going somewhere per patient    PUD  (peptic ulcer disease)    remote, reports f/u EGD about 8 years ago unremarkable    Reactive airway disease    related to exposure to chemical during 9/11   Renal insufficiency    Sinusitis    Vitamin D deficiency     Medications:  See med rec  Assessment:  71 year old male with history of CAD (mild on cath 03/2013), IDDM, hyperlipidemia, hypertension, morbid obesity, OSA, ESRD on PD who presents with chest pain. Patient is not on oral anticoagulants.  Pharmacy asked to start heparin.  Heparin level continues to be at goal on recheck this evening on 1200 units/hr.  Goal of Therapy:  Heparin level 0.3-0.7 units/ml Monitor platelets by anticoagulation protocol: Yes   Plan:  Continue heparin infusion at 1200 units/hr Check anti-Xa level daily while on heparin Continue to monitor H&H and platelets  Erin Hearing PharmD., BCPS  Clinical Pharmacist 07/23/2021 6:16 PM

## 2021-07-23 NOTE — Progress Notes (Incomplete)
*  PRELIMINARY RESULTS* Echocardiogram 2D Echocardiogram has been performed.  [name] [name] 07/23/2021, 9:17 AM

## 2021-07-23 NOTE — Progress Notes (Addendum)
PROGRESS NOTE    [name] [name]  ERD:[id number] DOB: 1950-10-01 DOA: 07/22/2021 PCP: [name], MD    Chief Complaint  Patient presents with   Chest Pain    Brief Narrative:  71 year old male with past medical history significant of end-stage renal disease on peritoneal dialysis, history of nonobstructive coronary artery disease, hypertension, hyperlipidemia and insulin-dependent diabetes, presented to emergency department with complaints of chest pain.  Work-up demonstrated no acute cardiopulmonary process on chest x-ray, elevated troponin in the 3428 range suggestive of NSTEMI.  Assessment & Plan: 1-NSTEMI (non-ST elevated myocardial infarction) (HCC) -Continue aspirin, Toprol, losartan, statin and heparin drip -Patient remains chest pain-free at this time -N.p.o. after midnight with anticipated plans to transfer to [facility] for left heart cath. -Patient has been seen by cardiology service. -2D echo has been ordered to evaluate structure and ejection fraction.  Pending at this time.  2-Hyperlipidemia -LDL 53 -Continue statin.  3-CAD (coronary artery disease) -As mentioned in problem #1; planning for echo and left heart cath. -Follow any further recommendation by cardiology service -Continue aspirin, statins, losartan and beta-blocker.  4-End stage renal disease [facility]) -Nephrology service has been consulted and the plan is to continue peritoneal dialysis. -Patient will be transferred to [facility] for treatments.  5-insulin-dependent diabetes with nephropathy -Continue sliding scale insulin -Follow CBGs and adjust hypoglycemic regimen as required. -A1c 6.6  6-gastroesophageal flux disease/history of peptic ulcer disease -Continue PPI.  7-CVOID-19 infection -Asymptomatic -Good saturation on room air -Not experiencing cough or respiratory symptoms -Chest x-ray without active cardiopulmonary process. -Continue to provide supportive care. -No  treatment required at this time. -Bronchodilators will be available.  8-hx of COPD -No complaints of shortness of breath or wheezing -Continue as needed bronchodilators and resumption of chronic maintenance therapy with Symbicort equivalent.  9-class 2 obesity -Body mass index is 39.48 kg/m. -Low calorie diet, portion control and increase physical activity discussed with patient.   DVT prophylaxis: Heparin drip Code Status: Full code Family Communication: No family at bedside. Disposition:   Status is: Inpatient    Consultants:  Cardiology service Nephrology service  Procedures:  2D echo: Pending Chest x-ray: No cardiopulmonary process. Left heart cath: Pending  Antimicrobials: None  Subjective: Reports mild headache after receiving nitroglycerin overnight; chest pain-free, no nausea, no vomiting.  No requiring oxygen supplementation and denying any shortness of breath at this time.  Objective: Vitals:   07/22/21 2041 07/22/21 2222 07/23/21 0207 07/23/21 0440  BP:  (!) 159/102 (!) 155/95 122/61  Pulse:  93 96 83  Resp:  (!) 21 18 (!) 24  Temp:      TempSrc:      SpO2: 97% 98% 99% 96%  Weight:      Height:       No intake or output data in the 24 hours ending 07/23/21 0759 Filed Weights   07/22/21 1356  Weight: 104.3 kg    Examination:  General exam: Appears calm and comfortable; in no acute distress.  Denies upper respiratory symptoms. Respiratory system: Clear to auscultation. Respiratory effort normal.  No using accessory muscle.  Good saturation on room air. Cardiovascular system: S1 & S2 heard, RRR. No JVD, murmurs, rubs, gallops or clicks. No pedal edema. Gastrointestinal system: Abdomen is obese, nondistended, soft and nontender. No organomegaly or masses felt. Normal bowel sounds heard. Central nervous system: Alert and oriented. No focal neurological deficits. Extremities: Trace edema, no cyanosis or clubbing. Skin: No petechiae. Psychiatry:  Judgement and insight appear normal.  Mood & affect appropriate.   Data Reviewed: I have personally reviewed following labs and imaging studies  CBC: Recent Labs  Lab 07/22/21 1416 07/23/21 0532  WBC 5.0 4.3  HGB 10.2* 9.5*  HCT 31.1* 28.9*  MCV 94.0 92.9  PLT 100* 83*    Basic Metabolic Panel: Recent Labs  Lab 07/22/21 1416  NA 135  K 3.7  CL 92*  CO2 27  GLUCOSE 145*  BUN 59*  CREATININE 17.84*  CALCIUM 9.2    GFR: Estimated Creatinine Clearance: 4.1 mL/min (A) (by C-G formula based on SCr of 17.84 mg/dL (H)).   CBG: Recent Labs  Lab 07/22/21 1838 07/22/21 2221  GLUCAP 72 95     Recent Results (from the past 240 hour(s))  Resp Panel by RT-PCR (Flu A&B, Covid) Nasopharyngeal Swab     Status: Abnormal   Collection Time: 07/22/21  3:42 PM   Specimen: Nasopharyngeal Swab; Nasopharyngeal(NP) swabs in vial transport medium  Result Value Ref Range Status   SARS Coronavirus 2 by RT PCR POSITIVE (A) NEGATIVE Final    Comment: (NOTE) SARS-CoV-2 target nucleic acids are DETECTED.  The SARS-CoV-2 RNA is generally detectable in upper respiratory specimens during the acute phase of infection. Positive results are indicative of the presence of the identified virus, but do not rule out bacterial infection or co-infection with other pathogens not detected by the test. Clinical correlation with patient history and other diagnostic information is necessary to determine patient infection status. The expected result is Negative.  Fact Sheet for Patients: EntrepreneurPulse.com.au  Fact Sheet for Healthcare Providers: IncredibleEmployment.be  This test is not yet approved or cleared by the [place] FDA and  has been authorized for detection and/or diagnosis of SARS-CoV-2 by FDA under an Emergency Use Authorization (EUA).  This EUA will remain in effect (meaning this test can be used) for the duration of  the COVID-19 declaration  under Section 564(b)(1) of the A ct, 21 U.S.C. section 360bbb-3(b)(1), unless the authorization is terminated or revoked sooner.     Influenza A by PCR NEGATIVE NEGATIVE Final   Influenza B by PCR NEGATIVE NEGATIVE Final    Comment: (NOTE) The Xpert Xpress SARS-CoV-2/FLU/RSV plus assay is intended as an aid in the diagnosis of influenza from Nasopharyngeal swab specimens and should not be used as a sole basis for treatment. Nasal washings and aspirates are unacceptable for Xpert Xpress SARS-CoV-2/FLU/RSV testing.  Fact Sheet for Patients: EntrepreneurPulse.com.au  Fact Sheet for Healthcare Providers: IncredibleEmployment.be  This test is not yet approved or cleared by the [place] FDA and has been authorized for detection and/or diagnosis of SARS-CoV-2 by FDA under an Emergency Use Authorization (EUA). This EUA will remain in effect (meaning this test can be used) for the duration of the COVID-19 declaration under Section 564(b)(1) of the Act, 21 U.S.C. section 360bbb-3(b)(1), unless the authorization is terminated or revoked.  Performed at [facility], [address] [place], [place] [address]     Radiology Studies: [facility] 1 View  Result Date: 07/22/2021 CLINICAL DATA:  chest pain EXAM: PORTABLE CHEST 1 VIEW COMPARISON:  August 29, 2018. FINDINGS: No consolidation. No visible pleural effusions or pneumothorax. Cardiomediastinal silhouette is within normal limits. No evidence of acute osseous abnormality. IMPRESSION: No evidence of acute cardiopulmonary disease. Electronically Signed   By: Margaretha Sheffield M.D.   On: 07/22/2021 14:31    Scheduled Meds:  aspirin EC  81 mg Oral Daily   insulin aspart  0-5 Units Subcutaneous QHS  insulin aspart  0-9 Units Subcutaneous TID WC   insulin detemir  20 Units Subcutaneous QHS   losartan  50 mg Oral Daily   metoprolol succinate  25 mg Oral Daily   mometasone-formoterol  2 puff  Inhalation BID   pantoprazole  40 mg Oral BID AC   simvastatin  20 mg Oral q morning   torsemide  100 mg Oral Daily   Continuous Infusions:  heparin 1,200 Units/hr (07/23/21 0104)     LOS: 1 day    Barton Dubois, MD [facility]   To contact the attending provider between 7A-7P or the covering provider during after hours 7P-7A, please log into the web site [email or web address] and access using universal [place] password for that web site. If you do not have the password, please call the hospital operator.  07/23/2021, 7:59 AM

## 2021-07-23 NOTE — Progress Notes (Signed)
ANTICOAGULATION CONSULT NOTE - Follow Up Consult  Pharmacy Consult for heparin Indication:  NSTEMI  Labs: Recent Labs    07/22/21 1416 07/22/21 1600 07/22/21 2329  HGB 10.2*  --   --   HCT 31.1*  --   --   PLT 100*  --   --   HEPARINUNFRC  --   --  0.20*  CREATININE 17.84*  --   --   TROPONINIHS 3,428* 3,386*  --     Assessment: 71yo male subtherapeutic on heparin with initial dosing for NSTEMI; no infusion issues or signs of bleeding per RN.  Goal of Therapy:  Heparin level 0.3-0.7 units/ml   Plan:  Will rebolus with heparin 2000 units and increase heparin infusion by 2 units/kg/hr to 1200 units/hr and check level in 8 hours.    Wynona Neat, PharmD, BCPS  07/23/2021,1:02 AM

## 2021-07-23 NOTE — Progress Notes (Signed)
ANTICOAGULATION CONSULT NOTE -   Pharmacy Consult for Heparin Indication: chest pain/ACS  Allergies  Allergen Reactions   Tramadol Hcl    Opana [Oxymorphone Hcl] Itching   Oxymorphone Itching   Tramadol Itching    Patient Measurements: Height: 5\' 4"  (162.6 cm) Weight: 104.3 kg (230 lb) IBW/kg (Calculated) : 59.2 HEPARIN DW (KG): 83.1   Vital Signs: Temp: 99.2 F (37.3 C) (01/07 0805) Temp Source: Oral (01/07 0805) BP: 133/64 (01/07 1110) Pulse Rate: 91 (01/07 1110)  Labs: Recent Labs    07/22/21 1416 07/22/21 1600 07/22/21 2329 07/23/21 0532 07/23/21 0842  HGB 10.2*  --   --  9.5*  --   HCT 31.1*  --   --  28.9*  --   PLT 100*  --   --  83*  --   HEPARINUNFRC  --   --  0.20*  --  0.55  CREATININE 17.84*  --   --   --   --   TROPONINIHS 3,428* 3,386*  --   --   --      Estimated Creatinine Clearance: 4.1 mL/min (A) (by C-G formula based on SCr of 17.84 mg/dL (H)).   Medical History: Past Medical History:  Diagnosis Date   Anemia    Arthritis    Asthma    Bell palsy    CAD (coronary artery disease)    a. 2014 MV: abnl w/ infap ischemia; b. 03/2013 Cath: aneurysmal bleb in the LAD w/ otw nonobs dzs-->Med Rx.   Chronic back pain    Chronic knee pain    a. 09/2015 s/p R TKA.   Chronic pain    Chronic shoulder pain    Chronic sinusitis    CKD (chronic kidney disease), stage IV (HCC)    stage 4 per office visit note of Dr Lowanda Foster on 05/2015    COPD (chronic obstructive pulmonary disease) ([place])    Diabetes mellitus without complication ([place])    type II    Essential hypertension    GERD (gastroesophageal reflux disease)    Gout    Gout    Hepatomegaly    noted on noncontrast CT 2015   History of hiatal hernia    Hyperlipidemia    Lateral meniscus tear    Obesity    Truncal   Obstructive sleep apnea    does not use cpap    On home oxygen therapy    uses 2l when is going somewhere per patient    PUD (peptic ulcer disease)    remote, reports f/u  EGD about 8 years ago unremarkable    Reactive airway disease    related to exposure to chemical during 9/11   Renal insufficiency    Sinusitis    Vitamin D deficiency     Medications:  See med rec  Assessment:  71 year old male with history of CAD (mild on cath 03/2013), IDDM, hyperlipidemia, hypertension, morbid obesity, OSA, ESRD on PD who presents with chest pain. Patient is not on oral anticoagulants.  Pharmacy asked to start heparin.  HL 0.55, therapeutic  Goal of Therapy:  Heparin level 0.3-0.7 units/ml Monitor platelets by anticoagulation protocol: Yes   Plan:  Continue heparin infusion at 1200 units/hr Check anti-Xa level in ~8 hours and daily while on heparin Continue to monitor H&H and platelets  Isac Sarna, BS Vena [place], BCPS Clinical Pharmacist Pager [phone] 07/23/2021,11:16 AM

## 2021-07-23 NOTE — ED Notes (Signed)
Attending at bedside, echo in process. Pt denies any discomfort or chest pain at this time.Alert and oriented. Heparin infusing. [facility].

## 2021-07-24 DIAGNOSIS — I214 Non-ST elevation (NSTEMI) myocardial infarction: Secondary | ICD-10-CM | POA: Diagnosis not present

## 2021-07-24 LAB — RENAL FUNCTION PANEL
Albumin: 2.8 g/dL — ABNORMAL LOW (ref 3.5–5.0)
Anion gap: 15 (ref 5–15)
BUN: 73 mg/dL — ABNORMAL HIGH (ref 8–23)
CO2: 26 mmol/L (ref 22–32)
Calcium: 9 mg/dL (ref 8.9–10.3)
Chloride: 89 mmol/L — ABNORMAL LOW (ref 98–111)
Creatinine, Ser: 20.21 mg/dL — ABNORMAL HIGH (ref 0.61–1.24)
GFR, Estimated: 2 mL/min — ABNORMAL LOW (ref >60)
Glucose, Bld: 97 mg/dL (ref 70–99)
Phosphorus: 8.8 mg/dL — ABNORMAL HIGH (ref 2.5–4.6)
Potassium: 3.3 mmol/L — ABNORMAL LOW (ref 3.5–5.1)
Sodium: 130 mmol/L — ABNORMAL LOW (ref 135–145)

## 2021-07-24 LAB — GASTROINTESTINAL PANEL BY PCR, STOOL (REPLACES STOOL CULTURE)

## 2021-07-24 LAB — HEPATIC FUNCTION PANEL
ALT: 19 U/L (ref 0–44)
AST: 22 U/L (ref 15–41)
Albumin: 3 g/dL — ABNORMAL LOW (ref 3.5–5.0)
Alkaline Phosphatase: 67 U/L (ref 38–126)
Bilirubin, Direct: <0.1 mg/dL (ref 0.0–0.2)
Total Bilirubin: 0.5 mg/dL (ref 0.3–1.2)
Total Protein: 6.5 g/dL (ref 6.5–8.1)

## 2021-07-24 LAB — GLUCOSE, CAPILLARY
Glucose-Capillary: 87 mg/dL (ref 70–99)
Glucose-Capillary: 126 mg/dL — ABNORMAL HIGH (ref 70–99)
Glucose-Capillary: 106 mg/dL — ABNORMAL HIGH (ref 70–99)
Glucose-Capillary: 103 mg/dL — ABNORMAL HIGH (ref 70–99)

## 2021-07-24 LAB — CBC
HCT: 30.9 % — ABNORMAL LOW (ref 39.0–52.0)
Hemoglobin: 10.1 g/dL — ABNORMAL LOW (ref 13.0–17.0)
MCH: 30 pg (ref 26.0–34.0)
MCHC: 32.7 g/dL (ref 30.0–36.0)
MCV: 91.7 fL (ref 80.0–100.0)
Platelets: 80 10*3/uL — ABNORMAL LOW (ref 150–400)
RBC: 3.37 MIL/uL — ABNORMAL LOW (ref 4.22–5.81)
RDW: 13.2 % (ref 11.5–15.5)
WBC: 3.5 10*3/uL — ABNORMAL LOW (ref 4.0–10.5)
nRBC: 0 % (ref 0.0–0.2)

## 2021-07-24 LAB — C DIFFICILE QUICK SCREEN W PCR REFLEX
C Diff antigen: POSITIVE — AB
C Diff toxin: NEGATIVE

## 2021-07-24 LAB — CLOSTRIDIUM DIFFICILE BY PCR, REFLEXED: Toxigenic C. Difficile by PCR: NEGATIVE

## 2021-07-24 LAB — HEPARIN LEVEL (UNFRACTIONATED): Heparin Unfractionated: 0.63 IU/mL (ref 0.30–0.70)

## 2021-07-24 MED ORDER — ASPIRIN 81 MG PO CHEW
81.0000 mg | CHEWABLE_TABLET | ORAL | Status: AC
  Administered 2021-07-25: 81 mg via ORAL
  Filled 2021-07-24: qty 1

## 2021-07-24 MED ORDER — CALCITRIOL 0.25 MCG PO CAPS
0.2500 ug | ORAL_CAPSULE | Freq: Every day | ORAL | Status: DC
  Administered 2021-07-24 – 2021-07-27 (×4): 0.25 ug via ORAL
  Filled 2021-07-24 (×5): qty 1

## 2021-07-24 MED ORDER — SODIUM CHLORIDE 0.9 % IV SOLN: INTRAVENOUS | Status: DC

## 2021-07-24 MED ORDER — POTASSIUM CHLORIDE CRYS ER 20 MEQ PO TBCR
20.0000 meq | EXTENDED_RELEASE_TABLET | Freq: Once | ORAL | Status: AC
  Administered 2021-07-24: 20 meq via ORAL
  Filled 2021-07-24: qty 1

## 2021-07-24 MED ORDER — ALUM & MAG HYDROXIDE-SIMETH 200-200-20 MG/5ML PO SUSP
30.0000 mL | ORAL | Status: DC | PRN
  Administered 2021-07-24 – 2021-07-27 (×4): 30 mL via ORAL
  Filled 2021-07-24 (×5): qty 30

## 2021-07-24 MED ORDER — SODIUM CHLORIDE 0.9% FLUSH
3.0000 mL | Freq: Two times a day (BID) | INTRAVENOUS | Status: DC
  Administered 2021-07-24: 3 mL via INTRAVENOUS

## 2021-07-24 MED ORDER — SODIUM CHLORIDE 0.9% FLUSH: 3.0000 mL | INTRAVENOUS | Status: DC | PRN

## 2021-07-24 MED ORDER — SODIUM CHLORIDE 0.9 % IV SOLN: 250.0000 mL | INTRAVENOUS | Status: DC | PRN

## 2021-07-24 NOTE — Progress Notes (Signed)
ANTICOAGULATION CONSULT NOTE - Follow Up Consult  Pharmacy Consult for Heparin Indication: chest pain/ACS  Allergies  Allergen Reactions   Tramadol Hcl    Opana [Oxymorphone Hcl] Itching   Oxymorphone Itching   Tramadol Itching    Patient Measurements: Height: 5\' 4"  (162.6 cm) Weight: 105.2 kg (231 lb 14.8 oz) IBW/kg (Calculated) : 59.2 Heparin Dosing Weight: 83.4 kg  Vital Signs: Temp: 98.3 F (36.8 C) (01/08 0444) Temp Source: Oral (01/08 0444) BP: 145/86 (01/08 0444) Pulse Rate: 54 (01/08 0444)  Labs: Recent Labs    07/22/21 1416 07/22/21 1600 07/22/21 2329 07/23/21 0532 07/23/21 0842 07/23/21 1625 07/24/21 0254  HGB 10.2*  --   --  9.5*  --   --  10.1*  HCT 31.1*  --   --  28.9*  --   --  30.9*  PLT 100*  --   --  83*  --   --  80*  HEPARINUNFRC  --   --    < >  --  0.55 0.54 0.63  CREATININE 17.84*  --   --   --   --   --  20.21*  TROPONINIHS 3,428* 3,386*  --   --   --   --   --    < > = values in this interval not displayed.    Estimated Creatinine Clearance: 3.7 mL/min (A) (by C-G formula based on SCr of 20.21 mg/dL (H)).   Medications:  Scheduled:   aspirin EC  81 mg Oral Daily   Chlorhexidine Gluconate Cloth  6 each Topical Daily   gentamicin cream  1 application Topical Daily   insulin aspart  0-5 Units Subcutaneous QHS   insulin aspart  0-9 Units Subcutaneous TID WC   insulin detemir  20 Units Subcutaneous QHS   losartan  50 mg Oral Daily   metoprolol succinate  25 mg Oral Daily   mometasone-formoterol  2 puff Inhalation BID   pantoprazole  40 mg Oral BID AC   simvastatin  20 mg Oral q morning   torsemide  100 mg Oral Daily   Infusions:   dialysis solution 2.5% low-MG/low-CA     heparin 1,200 Units/hr (07/23/21 1801)    Assessment: 71 yo M with history of CAD (mild on cath 03/2013), IDDM, hyperlipidemia, hypertension, morbid obesity, OSA, ESRD on PD who presents with chest pain. Patient is not on anticoagulation PTA. Pharmacy consulted  for heparin dosing.  Heparin level today is therapeutic at 0.63, on 1200 units/hr. Hgb 10.1, plt 80. No line issues or signs/symptoms of bleeding noted per RN.  Goal of Therapy:  Heparin level 0.3-0.7 units/ml Monitor platelets by anticoagulation protocol: Yes   Plan:  Continue heparin gtt at 1200 units/hr. Daily CBC, heparin level. Monitor for signs/symptoms of bleeding.   Vance Peper, PharmD PGY1 Pharmacy Resident Phone [phone] 07/24/2021 8:32 AM   Please check AMION for all [place] phone numbers After 10:00 PM, call [place] [phone]

## 2021-07-24 NOTE — Progress Notes (Signed)
Order for routine EKG this am. I had tech do it and it appeared that there were EKG changes. Patient denies chest pain. I sent message to Doreene Adas, PA and she was going to have Dr. Harrington Challenger look at it. Dr. Harrington Challenger saw it on rounds and was not concerned. Will continue with routine patient care

## 2021-07-24 NOTE — Care Management (Signed)
°  Transition of Care [facility]) Screening Note   Patient Details  Name: [name] [name] Date of Birth: [birth year]   Transition of Care [facility]) CM/SW Contact:    Bethena Roys, RN Phone Number: 07/24/2021, 11:46 AM    Transition of Care Department [facility]) has reviewed patient and no TOC needs have been identified at this time. We will continue to monitor patient advancement through interdisciplinary progression rounds. If new patient transition needs arise, please place a TOC consult.

## 2021-07-24 NOTE — Progress Notes (Signed)
[facility] Progress Note  Name: [name] [name] MRN: [id number] DOB: 03/17/1951  Chief Complaint:  Shortness of breath and chest pain  Subjective:  no UF was charted today.  Patient confirmed he did get PD last night and machine is in the room.  Per floor RN verbal report (she spoke with dialysis RN earlier) the UF was about 950 mL.  He confirms taking a binder but not sure which one.  He confirms calcitriol 0.25 mcg daily.  No longer on oral NSAID - does use gel  Review of systems:  He's a little short of breath  Denies n/v Denies chest pain  Several loose stools   ---------------------  Background on consult:  ESRD patient Followed by Theressa Stamps presented with chest pain and shortness of breath. Found to have NSTEMI. Transferred to [facility] as on PD.  Usually two 2.5% bags and a 1.5% bag as well as icodextran last bag fill of 1800 mL.  He feels better today.  He didn't realize he had covid until our PA at [facility] let him know today. No fevers no abd pain or n/v.  He's never had peritonitis and PD fluid is clear.    Intake/Output Summary (Last 24 hours) at 07/24/2021 1444 Last data filed at 07/23/2021 1801 Gross per 24 hour  Intake 339.47 ml  Output --  Net 339.47 ml    Vitals:  Vitals:   07/24/21 0444 07/24/21 0830 07/24/21 0901 07/24/21 1218  BP: (!) 145/86 130/83  111/75  Pulse: (!) 54 65  79  Resp: 20 18  20   Temp: 98.3 F (36.8 C) 98.6 F (37 C)  98.4 F (36.9 C)  TempSrc: Oral Oral  Oral  SpO2: 96% 97% 97% 97%  Weight: 105.2 kg     Height:         Physical Exam:  General adult male in bed in no acute distress  HEENT normocephalic atraumatic extraocular movements intact sclera anicteric; periorbital edema  Neck supple trachea midline Lungs normal work of breathing at rest on room air Cardiology RRR Abdomen soft nontender nondistended Extremities no edema lower extremities Psych normal mood and affect Neuro alert and oriented x 3 provides hx  and follows commands Access: tenckhoff catheter in place bandaged; LUE AVF present with thrill     Medications reviewed   Labs:  BMP Latest Ref Rng & Units 07/24/2021 07/22/2021 02/09/2020  Glucose 70 - 99 mg/dL 97 145(H) 109(H)  BUN 8 - 23 mg/dL 73(H) 59(H) 64(H)  Creatinine 0.61 - 1.24 mg/dL 20.21(H) 17.84(H) 18.89(H)  BUN/Creat Ratio 6 - 22 (calc) - - 3(L)  Sodium 135 - 145 mmol/L 130(L) 135 141  Potassium 3.5 - 5.1 mmol/L 3.3(L) 3.7 3.9  Chloride 98 - 111 mmol/L 89(L) 92(L) 100  CO2 22 - 32 mmol/L 26 27 27   Calcium 8.9 - 10.3 mg/dL 9.0 9.2 10.3     Assessment/Plan:   # NSTEMI - per cardiology  - per note planning for left heart cath tomorrow   # Covid positive - therapies per primary team    # ESRD on PD - continue PD here - add one 4.25% bag into mix - the rest 2.5% exchanges. Would avoid any NSAID's - prior meds include diclofenac and he states doesn't take   # HTN - controlled. UF with PD as above  # Hypokalemia - potassium 20 meq once    # Anemia CKD - will need to request outpatient records. Hold ESA in setting of NSTEMI   #  Secondary hyperpara/metabolic bone disease - will need to request outpatient records. Start calcitriol 0.25 mcg daily.  Hyperphos - no binder on med list.  He can't remember what he takes and is going to call his wife to check when she's home  # thrombocytopenia - per primary team    Claudia Desanctis, MD 07/24/2021 3:08 PM

## 2021-07-24 NOTE — Progress Notes (Signed)
Patient declined cath video  

## 2021-07-24 NOTE — Progress Notes (Signed)
Cardiology Consultation:   Patient ID: [name] [name] MRN: [id number]; DOB: 1950/10/11  Admit date: 07/22/2021 Date of Consult: 07/24/2021  PCP:  [name], [facility] Providers Cardiologist:  Pixie Casino, MD        Patient Profile:   [name] [name] is a 71 y.o. male with a hx of CAD (mild on cath 03/2013), IDDM, hyperlipidemia, hypertension, morbid obesity, OSA, ESRD on PD who is being seen 07/24/2021 for the evaluation of NSTEMI at the request of Dr Roderic Palau.  SUBJECTIVE   Pt denies CP  Breathing is OK     He denies any COVID symptoms     History of Present Illness:   [name] [name] is a 71 year old male with history of CAD (mild on cath 03/2013), IDDM, hyperlipidemia, hypertension, morbid obesity, OSA, ESRD on PD who presents with chest pain.  Most recent echocardiogram 08/29/2018 showed EF 50 to 55%, moderate biatrial enlargement.  [place] 08/30/2018 showed normal perfusion.  He reports that his chest pain started yesterday morning.  Reports he had mild pressure in center of his chest.  However yesterday evening he was loading wood into a wheelbarrow and began having severe chest pain, which he describes as 10 out of 10.  Also started having nausea and vomiting.  He tried to lie down and eventually the pain subsided.  He called his cardiology office this morning and was recommended to go to the ED.  Reports on arrival in the ED was having 5 out of 10 pain.  He is currently chest pain-free.  In the ED, EKG shows sinus rhythm, second-degree AV block Mobitz type I, rate 78, LVH with repolarization abnormalities.  Initial vital signs notable for BP 109/71, pulse 95, SPO2 93% on room air.  Labs notable for troponin 3428, creatinine 18, sodium 135, potassium 3.7, hemoglobin 10.2, platelets 100.  Chest x-ray showed no acute abnormalities.   Past Medical History:  Diagnosis Date   Anemia    Arthritis    Asthma    Bell palsy    CAD (coronary artery disease)    a. 2014  MV: abnl w/ infap ischemia; b. 03/2013 Cath: aneurysmal bleb in the LAD w/ otw nonobs dzs-->Med Rx.   Chronic back pain    Chronic knee pain    a. 09/2015 s/p R TKA.   Chronic pain    Chronic shoulder pain    Chronic sinusitis    CKD (chronic kidney disease), stage IV (HCC)    stage 4 per office visit note of Dr Lowanda Foster on 05/2015    COPD (chronic obstructive pulmonary disease) ([facility])    Diabetes mellitus without complication ([place])    type II    Essential hypertension    GERD (gastroesophageal reflux disease)    Gout    Gout    Hepatomegaly    noted on noncontrast CT 2015   History of hiatal hernia    Hyperlipidemia    Lateral meniscus tear    Obesity    Truncal   Obstructive sleep apnea    does not use cpap    On home oxygen therapy    uses 2l when is going somewhere per patient    PUD (peptic ulcer disease)    remote, reports f/u EGD about 8 years ago unremarkable    Reactive airway disease    related to exposure to chemical during 9/11   Renal insufficiency    Sinusitis    Vitamin D deficiency  Past Surgical History:  Procedure Laterality Date   ASAD LT SHOULDER  12/15/2008   left shoulder   AV FISTULA PLACEMENT Left 08/09/2016   Procedure: BRACHIOCEPHALIC ARTERIOVENOUS (AV) FISTULA CREATION LEFT ARM;  Surgeon: [name], MD;  Location: [facility];  Service: Vascular;  Laterality: Left;   CAPD INSERTION N/A 10/07/2018   Procedure: LAPAROSCOPIC PERITONEAL CATHETER PLACEMENT;  Surgeon: Mickeal Skinner, MD;  Location: [facility];  Service: General;  Laterality: N/A;   CATARACT EXTRACTION W/PHACO Left 03/28/2016   Procedure: CATARACT EXTRACTION PHACO AND INTRAOCULAR LENS PLACEMENT LEFT EYE;  Surgeon: [name], MD;  Location: [facility];  Service: Ophthalmology;  Laterality: Left;  CDE: 4.77   CATARACT EXTRACTION W/PHACO Right 04/11/2016   Procedure: CATARACT EXTRACTION PHACO AND INTRAOCULAR LENS PLACEMENT RIGHT EYE; CDE:  4.74;  Surgeon: [name], MD;  Location:  [facility];  Service: Ophthalmology;  Laterality: Right;   COLONOSCOPY  10/15/2008   Fields: Rectal polyp obliterated, not retrieved, hemorrhoids, single ascending colon diverticulum near the CV. Next colonoscopy April 2020   COLONOSCOPY N/A 12/25/2014   SLF: 1. Colorectal polyps (2) removed 2. Small internal hemorrhoids 3. the left colon is severely redundant. hyperplastic polyps   DOPPLER ECHOCARDIOGRAPHY     ESOPHAGOGASTRODUODENOSCOPY N/A 12/25/2014   SLF: 1. Anemia most likely due to CRI, gastritis, gastric polyps 2. Moderate non-erosive gastriits and mild duodenitis.  3.TWo large gstric polyps removed.    EYE SURGERY  12/22/2010   tear duct probing-[place]   FOREIGN BODY REMOVAL  03/29/2011   Procedure: REMOVAL FOREIGN BODY EXTREMITY;  Surgeon: Arther Abbott, MD;  Location: [facility];  Service: Orthopedics;  Laterality: Right;  Removal Foreign Body Right Thumb   IR FLUORO GUIDE CV LINE RIGHT  08/06/2018   IR US GUIDE VASC ACCESS RIGHT  08/06/2018   KNEE ARTHROSCOPY  10/16/2007   left   KNEE ARTHROSCOPY WITH LATERAL MENISECTOMY Right 10/14/2015   Procedure: LEFT KNEE ARTHROSCOPY WITH PARTIAL LATERAL MENISECTOMY;  Surgeon: Carole Civil, MD;  Location: [facility];  Service: Orthopedics;  Laterality: Right;   LEFT HEART CATHETERIZATION WITH CORONARY ANGIOGRAM N/A 03/28/2013   Procedure: LEFT HEART CATHETERIZATION WITH CORONARY ANGIOGRAM;  Surgeon: Leonie Man, MD;  Location: [facility] C[facility];  Service: Cardiovascular;  Laterality: N/A;   NM MYOVIEW LTD     PENILE PROSTHESIS IMPLANT N/A 08/16/2015   Procedure: PENILE PROTHESIS INFLATABLE, three piece, Excisional biopsy of Penile ulcer, Penile molding;  Surgeon: Carolan Clines, MD;  Location: [facility];  Service: Urology;  Laterality: N/A;   PENILE PROSTHESIS IMPLANT N/A 12/24/2017   Procedure: REMOVAL AND  REPLACEMENT  COLOPLAST PENILE PROSTHESIS;  Surgeon: Lucas Mallow, MD;  Location: [facility];  Service: Urology;  Laterality: N/A;    QUADRICEPS TENDON REPAIR  07/21/2011   Procedure: REPAIR QUADRICEP TENDON;  Surgeon: Arther Abbott, MD;  Location: [facility];  Service: Orthopedics;  Laterality: Right;   TOENAIL EXCISION     removed [id number]   UMBILICAL HERNIA REPAIR  07/17/2005   [place]      Inpatient Medications: Scheduled Meds:  aspirin EC  81 mg Oral Daily   Chlorhexidine Gluconate Cloth  6 each Topical Daily   gentamicin cream  1 application Topical Daily   insulin aspart  0-5 Units Subcutaneous QHS   insulin aspart  0-9 Units Subcutaneous TID WC   insulin detemir  20 Units Subcutaneous QHS   losartan  50 mg Oral Daily   metoprolol succinate  25 mg Oral Daily  mometasone-formoterol  2 puff Inhalation BID   pantoprazole  40 mg Oral BID AC   simvastatin  20 mg Oral q morning   torsemide  100 mg Oral Daily   Continuous Infusions:  dialysis solution 2.5% low-MG/low-CA     heparin 1,200 Units/hr (07/23/21 1801)   PRN Meds:   Allergies:    Allergies  Allergen Reactions   Tramadol Hcl    Opana [Oxymorphone Hcl] Itching   Oxymorphone Itching   Tramadol Itching    Social History:   Social History   Socioeconomic History   Marital status: Married    Spouse name: Not on file   Number of children: 2   Years of education: 12th grade   Highest education level: Not on file  Occupational History   Occupation: disabled   Occupation:      Fish farm manager: UNEMPLOYED  Tobacco Use   Smoking status: Former    Packs/day: 1.00    Years: 25.00    Pack years: 25.00    Types: Cigarettes    Quit date: 03/27/2010    Years since quitting: 11.3   Smokeless tobacco: Never   Tobacco comments:    Quit x 7 years  Vaping Use   Vaping Use: Never used  Substance and Sexual Activity   Alcohol use: Yes    Alcohol/week: 0.0 standard drinks    Comment: occasionally   Drug use: Not Currently    Types: Marijuana    Comment: cocaine- last time used- 11/24/2017 , marijuana-    Sexual activity: Not on file  Other  Topics Concern   Not on file  Social History Narrative   He quit smoking in 2010. 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 He developed pulmonary problems, became disabled because of lower airway disease in 2009.       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   Social Determinants of Health   Financial Resource Strain: Not on file  Food Insecurity: Not on file  Transportation Needs: Not on file  Physical Activity: Not on file  Stress: Not on file  Social Connections: Not on file  Intimate Partner Violence: Not on file    Family History:    Family History  Problem Relation Age of Onset   Hypertension Mother        MI   Cancer Mother        breast    Diabetes Mother    Diabetes Father    Hypertension Father    Hypertension Sister    Diabetes Sister    Arthritis Other    Asthma Other    Lung disease Other    Anesthesia problems Neg Hx    Hypotension Neg Hx    Malignant hyperthermia Neg Hx    Pseudochol deficiency Neg Hx    Colon cancer Neg Hx      ROS:  Please see the history of present illness.   All other ROS reviewed and negative.     Physical Exam/Data:   Vitals:   07/23/21 2024 07/23/21 2147 07/24/21 0047 07/24/21 0444  BP: 117/86   (!) 145/86  Pulse: 65 62  (!) 54  Resp: 20 14  20   Temp: 97.7 F (36.5 C)  98.3 F (36.8 C) 98.3 F (36.8 C)  TempSrc: Oral   Oral  SpO2: 98% 96%  96%  Weight:    105.2 kg  Height:        Intake/Output Summary (Last 24 hours)  at 07/24/2021 0805 Last data filed at 07/23/2021 1801 Gross per 24 hour  Intake 339.47 ml  Output --  Net 339.47 ml   Last 3 Weights 07/24/2021 07/23/2021 07/22/2021  Weight (lbs) 231 lb 14.8 oz 232 lb 4.8 oz 230 lb  Weight (kg) 105.2 kg 105.371 kg 104.327 kg     Body mass index is 39.81 kg/m.  General:  morbidly obese 71 yo in no acute distress HEENT: normal Neck: Neck full but no obvious JVD   Cardiac:  normal S1, S2; RRR; no murmur  Lungs: Mild rhonchi L  greater R   Abd: soft, nontender, no hepatomegaly  Ext: no edema Musculoskeletal:  No deformities, BUE and BLE strength normal and equal Skin: warm and dry  Neuro:  no focal abnormalities noted Psych:  Normal affect   EKG:  The EKG was personally reviewed and demonstrates:  EKG shows sinus rhythm, second-degree AV block Mobitz type I, rate 70,  Occasional PVC. LVH with repolarization abnormalities Telemetry:  Telemetry was personally reviewed and demonstrates: SR, Type I 2nd degree AV  block, 2:1 block   Relevant CV Studies: Echo  07/23/21 Left ventricular ejection fraction, by estimation, is 55 to 60%. The left ventricle has normal function. The left ventricle has no regional wall motion abnormalities. There is severe left ventricular hypertrophy. Left ventricular diastolic parameters are consistent with Grade I diastolic dysfunction (impaired relaxation). Elevated left atrial pressure. 1. Right ventricular systolic function is normal. The right ventricular size is normal. There is normal pulmonary artery systolic pressure. 2. 3. Left atrial size was mildly dilated. 4. Mild mitral valve regurgitation. AV is thickened with minimally restricted motion. Peak and mean gradients through the valve aer 19 and 10 mm Hg respectively. . The aortic valve is tricuspid. Aortic valve regurgitation is trivial. Aortic valve sclerosis is present, with no evidence of aortic valve stenosis.  Laboratory Data:  High Sensitivity Troponin:   Recent Labs  Lab 07/22/21 1416 07/22/21 1600  TROPONINIHS 3,428* 3,386*     Chemistry Recent Labs  Lab 07/22/21 1416 07/24/21 0254  NA 135 130*  K 3.7 3.3*  CL 92* 89*  CO2 27 26  GLUCOSE 145* 97  BUN 59* 73*  CREATININE 17.84* 20.21*  CALCIUM 9.2 9.0  GFRNONAA 3* 2*  ANIONGAP 16* 15    Recent Labs  Lab 07/24/21 0254  ALBUMIN 2.8*   Lipids No results for input(s): CHOL, TRIG, HDL, LABVLDL, LDLCALC, CHOLHDL in the last 168 hours.   Hematology Recent Labs  Lab 07/22/21 1416 07/23/21 0532 07/24/21 0254  WBC 5.0 4.3 3.5*  RBC 3.31* 3.11* 3.37*  HGB 10.2* 9.5* 10.1*  HCT 31.1* 28.9* 30.9*  MCV 94.0 92.9 91.7  MCH 30.8 30.5 30.0  MCHC 32.8 32.9 32.7  RDW 13.4 13.3 13.2  PLT 100* 83* 80*   Thyroid No results for input(s): TSH, FREET4 in the last 168 hours.  BNPNo results for input(s): BNP, PROBNP in the last 168 hours.  DDimer No results for input(s): DDIMER in the last 168 hours.   Radiology/Studies:  DG Chest Port 1 View  Result Date: 07/22/2021 CLINICAL DATA:  chest pain EXAM: PORTABLE CHEST 1 VIEW COMPARISON:  August 29, 2018. FINDINGS: No consolidation. No visible pleural effusions or pneumothorax. Cardiomediastinal silhouette is within normal limits. No evidence of acute osseous abnormality. IMPRESSION: No evidence of acute cardiopulmonary disease. Electronically Signed   By: Margaretha Sheffield M.D.   On: 07/22/2021 14:31   ECHOCARDIOGRAM COMPLETE  Result Date: 07/23/2021  ECHOCARDIOGRAM REPORT   Patient Name:   [name] [name] Date of Exam: 07/23/2021 Medical Rec #:  [id number]      Height:       64.0 in Accession #:    [id number]     Weight:       230.0 lb Date of Birth:  July 27, 1950     BSA:          2.076 m Patient Age:    35 years       BP:           122/61 mmHg Patient Gender: M              HR:           83 bpm. Exam Location:  [facility] Procedure: 2D Echo, Cardiac Doppler and Color Doppler Indications:    Chest Pain  History:        Patient has prior history of Echocardiogram examinations, most                 recent 08/29/2018. CAD and Previous Myocardial Infarction,                 Signs/Symptoms:Chest Pain; Risk Factors:Hypertension, Diabetes,                 Dyslipidemia and Former Smoker. COVID +.  Sonographer:    Wenda Low Referring Phys: [place]  1. Left ventricular ejection fraction, by estimation, is 55 to 60%. The left ventricle has normal function. The left  ventricle has no regional wall motion abnormalities. There is severe left ventricular hypertrophy. Left ventricular diastolic parameters  are consistent with Grade I diastolic dysfunction (impaired relaxation). Elevated left atrial pressure.  2. Right ventricular systolic function is normal. The right ventricular size is normal. There is normal pulmonary artery systolic pressure.  3. Left atrial size was mildly dilated.  4. Mild mitral valve regurgitation.  5. AV is thickened with minimally restricted motion. Peak and mean gradients through the valve aer 19 and 10 mm Hg respectively. . The aortic valve is tricuspid. Aortic valve regurgitation is trivial. Aortic valve sclerosis is present, with no evidence of aortic valve stenosis. FINDINGS  Left Ventricle: Left ventricular ejection fraction, by estimation, is 55 to 60%. The left ventricle has normal function. The left ventricle has no regional wall motion abnormalities. The left ventricular internal cavity size was normal in size. There is  severe left ventricular hypertrophy. Left ventricular diastolic parameters are consistent with Grade I diastolic dysfunction (impaired relaxation). Elevated left atrial pressure. Right Ventricle: The right ventricular size is normal. Right vetricular wall thickness was not assessed. Right ventricular systolic function is normal. There is normal pulmonary artery systolic pressure. The tricuspid regurgitant velocity is 2.73 m/s, and with an assumed right atrial pressure of 3 mmHg, the estimated right ventricular systolic pressure is 32.3 mmHg. Left Atrium: Left atrial size was mildly dilated. Right Atrium: Right atrial size was normal in size. Pericardium: Trivial pericardial effusion is present. Mitral Valve: There is mild thickening of the mitral valve leaflet(s). Mild mitral annular calcification. Mild mitral valve regurgitation. MV peak gradient, 8.8 mmHg. The mean mitral valve gradient is 4.0 mmHg. Tricuspid Valve: The  tricuspid valve is normal in structure. Tricuspid valve regurgitation is trivial. Aortic Valve: AV is thickened with minimally restricted motion. Peak and mean gradients through the valve aer 19 and 10 mm Hg respectively. The aortic valve is tricuspid. Aortic valve regurgitation is trivial. Aortic valve  sclerosis is present, with no evidence of aortic valve stenosis. Aortic valve mean gradient measures 10.5 mmHg. Aortic valve peak gradient measures 20.5 mmHg. Aortic valve area, by VTI measures 2.02 cm. Pulmonic Valve: The pulmonic valve was normal in structure. Pulmonic valve regurgitation is not visualized. Aorta: The aortic root and ascending aorta are structurally normal, with no evidence of dilitation. IAS/Shunts: No atrial level shunt detected by color flow Doppler.  LEFT VENTRICLE PLAX 2D LVIDd:         4.20 cm     Diastology LVIDs:         2.90 cm     LV e' medial:    5.50 cm/s LV PW:         2.03 cm     LV E/e' medial:  17.6 LV IVS:        1.80 cm     LV e' lateral:   5.87 cm/s LVOT diam:     2.00 cm     LV E/e' lateral: 16.5 LV SV:         88 LV SV Index:   43 LVOT Area:     3.14 cm  LV Volumes (MOD) LV vol d, MOD A2C: 72.1 ml LV vol d, MOD A4C: 88.9 ml LV vol s, MOD A2C: 29.2 ml LV vol s, MOD A4C: 41.1 ml LV SV MOD A2C:     42.9 ml LV SV MOD A4C:     88.9 ml LV SV MOD BP:      44.8 ml RIGHT VENTRICLE RV Basal diam:  3.30 cm RV Mid diam:    3.00 cm RV S prime:     11.49 cm/s TAPSE (M-mode): 2.9 cm LEFT ATRIUM             Index        RIGHT ATRIUM           Index LA diam:        5.20 cm 2.50 cm/m   RA Area:     20.00 cm LA Vol (A2C):   65.5 ml 31.55 ml/m  RA Volume:   49.60 ml  23.89 ml/m LA Vol (A4C):   79.3 ml 38.20 ml/m LA Biplane Vol: 74.4 ml 35.84 ml/m  AORTIC VALVE                     PULMONIC VALVE AV Area (Vmax):    1.93 cm      PV Vmax:       1.07 m/s AV Area (Vmean):   1.94 cm      PV Peak grad:  4.6 mmHg AV Area (VTI):     2.02 cm AV Vmax:           226.50 cm/s AV Vmean:           149.500 cm/s AV VTI:            0.436 m AV Peak Grad:      20.5 mmHg AV Mean Grad:      10.5 mmHg LVOT Vmax:         139.00 cm/s LVOT Vmean:        92.200 cm/s LVOT VTI:          0.281 m LVOT/AV VTI ratio: 0.64  AORTA Ao Root diam: 2.80 cm Ao Asc diam:  3.40 cm MITRAL VALVE                TRICUSPID VALVE MV Area (PHT): 5.62 cm  TR Peak grad:   29.8 mmHg MV Area VTI:   2.52 cm     TR Vmax:        273.00 cm/s MV Peak grad:  8.8 mmHg MV Mean grad:  4.0 mmHg     SHUNTS MV Vmax:       1.48 m/s     Systemic VTI:  0.28 m MV Vmean:      87.3 cm/s    Systemic Diam: 2.00 cm MV Decel Time: 135 msec MV E velocity: 96.80 cm/s MV A velocity: 130.00 cm/s MV E/A ratio:  0.74 Dorris Carnes MD Electronically signed by Dorris Carnes MD Signature Date/Time: 07/23/2021/1:57:30 PM    Final      Assessment and Plan:   Pt tx'd from [facility] yesterday   NSTEMI: Pt with hx nonobstructive CAD   Remote cath 2014  Presented with CP and NSTEMI     Currently chest pain-free\ Echo with normal LV function    -Aspirin 81 mg daily Heparin IV  -Continue Toprol-XL 25 mg daily -Continue statin Plan for L heart cath tomorrow     Hypertension: Continue losartan, Toprol-XL.  Hyperlipidemia: Continue simvastatin, His LDL was  53 on 06/10/2021  ESRD: On PD.  Management per nephrology  IDDM: Management per primary team     For questions or updates, please contact [facility] Please consult [email or web address] for contact info under    Signed, Dorris Carnes, MD  07/24/2021 8:05 AM

## 2021-07-24 NOTE — Progress Notes (Addendum)
PROGRESS NOTE    [name] TIBBETTS  ZWC:[id number] DOB: 13-Dec-1950 DOA: 07/22/2021 PCP: [name], MD    Brief Narrative:   [name] [name]  is a 71 y.o. male, with past medical history of ESRD, on peritoneal dialysis, diabetes mellitus, hypertension, hyperlipidemia, CAD, with mild on cardiac cath 03/2013. -Patient presents to ED secondary to complaints of chest pain, reports in the center of his chest, started yesterday morning, has worsened in the evening with minimal activity as he was loading some wood into a wheelbarrow, reports chest pain is severe, 10 out of 10 intensity, accompanied by some nausea, and vomiting, and some dyspnea as well, he called cardiology office who recommended him to come to ED, chest pain 5 out of 10 on presentation, but he is currently chest pain-free,. -In ED work-up significant for EKG with sinus rhythm, Mobitz 1 second-degree AV block, blood pressure 109/71 on presentation, saturating 93% on room air, initial troponin significantly elevated at 3428, creatinine of 18, sodium of 135, hemoglobin of 10, platelet of 100 K, chest x-ray with no acute findings, patient was seen by cardiology where he started on aspirin, full dose heparin for NSTEMI, and recommendation has been made to transfer to [facility] for [facility].     1/8 pt transferred for cath in am   Consultants:  Nephrology, cardiology  Procedures:PD  Antimicrobials:     Subjective: Has HA . No sob or cp currently  Objective: Vitals:   07/24/21 0047 07/24/21 0444 07/24/21 0830 07/24/21 0901  BP:  (!) 145/86 130/83   Pulse:  (!) 54 65   Resp:  20 18   Temp: 98.3 F (36.8 C) 98.3 F (36.8 C) 98.6 F (37 C)   TempSrc:  Oral Oral   SpO2:  96% 97% 97%  Weight:  105.2 kg    Height:        Intake/Output Summary (Last 24 hours) at 07/24/2021 0904 Last data filed at 07/23/2021 1801 Gross per 24 hour  Intake 339.47 ml  Output --  Net 339.47 ml   Filed Weights   07/22/21 1356 07/23/21 1527 07/24/21  0444  Weight: 104.3 kg 105.4 kg 105.2 kg    Examination:  General exam: Appears calm and comfortable  Respiratory system: decrease bs, no wheezing Cardiovascular system: S1 & S2 heard, RRR.no gallop Gastrointestinal system: Abdomen is nondistended, soft and nontender. Normal bowel sounds heard. Central nervous system: Alert and oriented.  Extremities: +edema Psychiatry: Judgement and insight appear normal. Mood & affect appropriate.     Data Reviewed: I have personally reviewed following labs and imaging studies  CBC: Recent Labs  Lab 07/22/21 1416 07/23/21 0532 07/24/21 0254  WBC 5.0 4.3 3.5*  HGB 10.2* 9.5* 10.1*  HCT 31.1* 28.9* 30.9*  MCV 94.0 92.9 91.7  PLT 100* 83* 80*   Basic Metabolic Panel: Recent Labs  Lab 07/22/21 1416 07/24/21 0254  NA 135 130*  K 3.7 3.3*  CL 92* 89*  CO2 27 26  GLUCOSE 145* 97  BUN 59* 73*  CREATININE 17.84* 20.21*  CALCIUM 9.2 9.0  PHOS  --  8.8*   GFR: Estimated Creatinine Clearance: 3.7 mL/min (A) (by C-G formula based on SCr of 20.21 mg/dL (H)). Liver Function Tests: Recent Labs  Lab 07/24/21 0254  ALBUMIN 2.8*   No results for input(s): LIPASE, AMYLASE in the last 168 hours. No results for input(s): AMMONIA in the last 168 hours. Coagulation Profile: No results for input(s): INR, PROTIME in the last 168 hours. Cardiac  Enzymes: No results for input(s): CKTOTAL, CKMB, CKMBINDEX, TROPONINI in the last 168 hours. BNP (last 3 results) No results for input(s): PROBNP in the last 8760 hours. HbA1C: Recent Labs    07/22/21 1416  HGBA1C 6.6*   CBG: Recent Labs  Lab 07/23/21 0809 07/23/21 1314 07/23/21 1633 07/23/21 1951 07/24/21 0823  GLUCAP 77 102* 86 86 87   Lipid Profile: No results for input(s): CHOL, HDL, LDLCALC, TRIG, CHOLHDL, LDLDIRECT in the last 72 hours. Thyroid Function Tests: No results for input(s): TSH, T4TOTAL, FREET4, T3FREE, THYROIDAB in the last 72 hours. Anemia Panel: No results for  input(s): VITAMINB12, FOLATE, FERRITIN, TIBC, IRON, RETICCTPCT in the last 72 hours. Sepsis Labs: No results for input(s): PROCALCITON, LATICACIDVEN in the last 168 hours.  Recent Results (from the past 240 hour(s))  Resp Panel by RT-PCR (Flu A&B, Covid) Nasopharyngeal Swab     Status: Abnormal   Collection Time: 07/22/21  3:42 PM   Specimen: Nasopharyngeal Swab; Nasopharyngeal(NP) swabs in vial transport medium  Result Value Ref Range Status   SARS Coronavirus 2 by RT PCR POSITIVE (A) NEGATIVE Final    Comment: (NOTE) SARS-CoV-2 target nucleic acids are DETECTED.  The SARS-CoV-2 RNA is generally detectable in upper respiratory specimens during the acute phase of infection. Positive results are indicative of the presence of the identified virus, but do not rule out bacterial infection or co-infection with other pathogens not detected by the test. Clinical correlation with patient history and other diagnostic information is necessary to determine patient infection status. The expected result is Negative.  Fact Sheet for Patients: EntrepreneurPulse.com.au  Fact Sheet for Healthcare Providers: IncredibleEmployment.be  This test is not yet approved or cleared by the [place] FDA and  has been authorized for detection and/or diagnosis of SARS-CoV-2 by FDA under an Emergency Use Authorization (EUA).  This EUA will remain in effect (meaning this test can be used) for the duration of  the COVID-19 declaration under Section 564(b)(1) of the A ct, 21 U.S.C. section 360bbb-3(b)(1), unless the authorization is terminated or revoked sooner.     Influenza A by PCR NEGATIVE NEGATIVE Final   Influenza B by PCR NEGATIVE NEGATIVE Final    Comment: (NOTE) The Xpert Xpress SARS-CoV-2/FLU/RSV plus assay is intended as an aid in the diagnosis of influenza from Nasopharyngeal swab specimens and should not be used as a sole basis for treatment. Nasal washings  and aspirates are unacceptable for Xpert Xpress SARS-CoV-2/FLU/RSV testing.  Fact Sheet for Patients: EntrepreneurPulse.com.au  Fact Sheet for Healthcare Providers: IncredibleEmployment.be  This test is not yet approved or cleared by the [place] FDA and has been authorized for detection and/or diagnosis of SARS-CoV-2 by FDA under an Emergency Use Authorization (EUA). This EUA will remain in effect (meaning this test can be used) for the duration of the COVID-19 declaration under Section 564(b)(1) of the Act, 21 U.S.C. section 360bbb-3(b)(1), unless the authorization is terminated or revoked.  Performed at [facility], [address], [place], [place] [address]   C Difficile Quick Screen w PCR reflex     Status: Abnormal   Collection Time: 07/23/21  6:52 AM   Specimen: STOOL  Result Value Ref Range Status   C Diff antigen POSITIVE (A) NEGATIVE Final   C Diff toxin NEGATIVE NEGATIVE Final   C Diff interpretation Results are indeterminate. See PCR results.  Final    Comment: Performed at [facility], [place] [address] [place], [place] [address]  C. Diff by PCR,  Reflexed     Status: None   Collection Time: 07/23/21  6:52 AM  Result Value Ref Range Status   Toxigenic C. Difficile by PCR NEGATIVE NEGATIVE Final    Comment: Patient is colonized with non toxigenic C. difficile. May not need treatment unless significant symptoms are present. Performed at [facility], [place] [address] [place], [place] [address]          Radiology Studies: DG Chest Port 1 View  Result Date: 07/22/2021 CLINICAL DATA:  chest pain EXAM: PORTABLE CHEST 1 VIEW COMPARISON:  August 29, 2018. FINDINGS: No consolidation. No visible pleural effusions or pneumothorax. Cardiomediastinal silhouette is within normal limits. No evidence of acute osseous abnormality. IMPRESSION: No evidence of acute cardiopulmonary disease. Electronically Signed   By: Margaretha Sheffield M.D.   On: 07/22/2021 14:31   ECHOCARDIOGRAM COMPLETE  Result Date: 07/23/2021    ECHOCARDIOGRAM REPORT   Patient Name:   COURT GRACIA Date of Exam: 07/23/2021 Medical Rec #:  [id number]      Height:       64.0 in Accession #:    [id number]     Weight:       230.0 lb Date of Birth:  [birth year]     BSA:          2.076 m Patient Age:    54 years       BP:           122/61 mmHg Patient Gender: M              HR:           83 bpm. Exam Location:  [facility] Procedure: 2D Echo, Cardiac Doppler and Color Doppler Indications:    Chest Pain  History:        Patient has prior history of Echocardiogram examinations, most                 recent 08/29/2018. CAD and Previous Myocardial Infarction,                 Signs/Symptoms:Chest Pain; Risk Factors:Hypertension, Diabetes,                 Dyslipidemia and Former Smoker. COVID +.  Sonographer:    Wenda Low Referring Phys: [place]  1. Left ventricular ejection fraction, by estimation, is 55 to 60%. The left ventricle has normal function. The left ventricle has no regional wall motion abnormalities. There is severe left ventricular hypertrophy. Left ventricular diastolic parameters  are consistent with Grade I diastolic dysfunction (impaired relaxation). Elevated left atrial pressure.  2. Right ventricular systolic function is normal. The right ventricular size is normal. There is normal pulmonary artery systolic pressure.  3. Left atrial size was mildly dilated.  4. Mild mitral valve regurgitation.  5. AV is thickened with minimally restricted motion. Peak and mean gradients through the valve aer 19 and 10 mm Hg respectively. . The aortic valve is tricuspid. Aortic valve regurgitation is trivial. Aortic valve sclerosis is present, with no evidence of aortic valve stenosis. FINDINGS  Left Ventricle: Left ventricular ejection fraction, by estimation, is 55 to 60%. The left ventricle has normal function. The left ventricle has no regional  wall motion abnormalities. The left ventricular internal cavity size was normal in size. There is  severe left ventricular hypertrophy. Left ventricular diastolic parameters are consistent with Grade I diastolic dysfunction (impaired relaxation). Elevated left atrial pressure. Right Ventricle: The right ventricular  size is normal. Right vetricular wall thickness was not assessed. Right ventricular systolic function is normal. There is normal pulmonary artery systolic pressure. The tricuspid regurgitant velocity is 2.73 m/s, and with an assumed right atrial pressure of 3 mmHg, the estimated right ventricular systolic pressure is 62.2 mmHg. Left Atrium: Left atrial size was mildly dilated. Right Atrium: Right atrial size was normal in size. Pericardium: Trivial pericardial effusion is present. Mitral Valve: There is mild thickening of the mitral valve leaflet(s). Mild mitral annular calcification. Mild mitral valve regurgitation. MV peak gradient, 8.8 mmHg. The mean mitral valve gradient is 4.0 mmHg. Tricuspid Valve: The tricuspid valve is normal in structure. Tricuspid valve regurgitation is trivial. Aortic Valve: AV is thickened with minimally restricted motion. Peak and mean gradients through the valve aer 19 and 10 mm Hg respectively. The aortic valve is tricuspid. Aortic valve regurgitation is trivial. Aortic valve sclerosis is present, with no evidence of aortic valve stenosis. Aortic valve mean gradient measures 10.5 mmHg. Aortic valve peak gradient measures 20.5 mmHg. Aortic valve area, by VTI measures 2.02 cm. Pulmonic Valve: The pulmonic valve was normal in structure. Pulmonic valve regurgitation is not visualized. Aorta: The aortic root and ascending aorta are structurally normal, with no evidence of dilitation. IAS/Shunts: No atrial level shunt detected by color flow Doppler.  LEFT VENTRICLE PLAX 2D LVIDd:         4.20 cm     Diastology LVIDs:         2.90 cm     LV e' medial:    5.50 cm/s LV PW:          2.03 cm     LV E/e' medial:  17.6 LV IVS:        1.80 cm     LV e' lateral:   5.87 cm/s LVOT diam:     2.00 cm     LV E/e' lateral: 16.5 LV SV:         88 LV SV Index:   43 LVOT Area:     3.14 cm  LV Volumes (MOD) LV vol d, MOD A2C: 72.1 ml LV vol d, MOD A4C: 88.9 ml LV vol s, MOD A2C: 29.2 ml LV vol s, MOD A4C: 41.1 ml LV SV MOD A2C:     42.9 ml LV SV MOD A4C:     88.9 ml LV SV MOD BP:      44.8 ml RIGHT VENTRICLE RV Basal diam:  3.30 cm RV Mid diam:    3.00 cm RV S prime:     11.49 cm/s TAPSE (M-mode): 2.9 cm LEFT ATRIUM             Index        RIGHT ATRIUM           Index LA diam:        5.20 cm 2.50 cm/m   RA Area:     20.00 cm LA Vol (A2C):   65.5 ml 31.55 ml/m  RA Volume:   49.60 ml  23.89 ml/m LA Vol (A4C):   79.3 ml 38.20 ml/m LA Biplane Vol: 74.4 ml 35.84 ml/m  AORTIC VALVE                     PULMONIC VALVE AV Area (Vmax):    1.93 cm      PV Vmax:       1.07 m/s AV Area (Vmean):   1.94 cm      PV  Peak grad:  4.6 mmHg AV Area (VTI):     2.02 cm AV Vmax:           226.50 cm/s AV Vmean:          149.500 cm/s AV VTI:            0.436 m AV Peak Grad:      20.5 mmHg AV Mean Grad:      10.5 mmHg LVOT Vmax:         139.00 cm/s LVOT Vmean:        92.200 cm/s LVOT VTI:          0.281 m LVOT/AV VTI ratio: 0.64  AORTA Ao Root diam: 2.80 cm Ao Asc diam:  3.40 cm MITRAL VALVE                TRICUSPID VALVE MV Area (PHT): 5.62 cm     TR Peak grad:   29.8 mmHg MV Area VTI:   2.52 cm     TR Vmax:        273.00 cm/s MV Peak grad:  8.8 mmHg MV Mean grad:  4.0 mmHg     SHUNTS MV Vmax:       1.48 m/s     Systemic VTI:  0.28 m MV Vmean:      87.3 cm/s    Systemic Diam: 2.00 cm MV Decel Time: 135 msec MV E velocity: 96.80 cm/s MV A velocity: 130.00 cm/s MV E/A ratio:  0.74 Dorris Carnes MD Electronically signed by Dorris Carnes MD Signature Date/Time: 07/23/2021/1:57:30 PM    Final         Scheduled Meds:  aspirin EC  81 mg Oral Daily   Chlorhexidine Gluconate Cloth  6 each Topical Daily   gentamicin cream  1  application Topical Daily   insulin aspart  0-5 Units Subcutaneous QHS   insulin aspart  0-9 Units Subcutaneous TID WC   insulin detemir  20 Units Subcutaneous QHS   losartan  50 mg Oral Daily   metoprolol succinate  25 mg Oral Daily   mometasone-formoterol  2 puff Inhalation BID   pantoprazole  40 mg Oral BID AC   simvastatin  20 mg Oral q morning   torsemide  100 mg Oral Daily   Continuous Infusions:  dialysis solution 2.5% low-MG/low-CA     heparin 1,200 Units/hr (07/24/21 0855)    Assessment & Plan:   Principal Problem:   NSTEMI (non-ST elevated myocardial infarction) ([place]) Active Problems:   Hyperlipidemia   CAD (coronary artery disease)   Coronary artery disease involving native coronary artery of native heart without angina pectoris   End stage renal disease (HCC)   Chest pain   1.NSTEMI Cards following Heparing gtt, asa, beta blk, statin to be continued NPO at [facility] for LHC in am Echo nml EF  2.Hyperlipidemia Continue statin LDL at goal, 53  3. CAD  Continue beta blk, statin, asa  4.ESRD Nephrology following PD continued  5.IDDM with nephropathy A1 6.6 Continue riss  6.GERD Continue PPI  7. Covid infection No respiratory sx Asx Continue to monitor   8.Obesity Class 2 Monitor weight, consider wt loss and increase activity as tolerated  9.h/xo copd-  without acute exacerbation Continue inhalers  10. Thrombocytopenia No s/sx of bleed Unclear etiology Is it covid related?  Monitor cbc    DVT prophylaxis: heparin Code Status:Full Family Communication: none at bedside Disposition Plan:  Status is: Inpatient  Remains inpatient appropriate because: IV  treatment. Needs LHC            LOS: 2 days   Time spent: 45 min with >50% on coc    Nolberto Hanlon, MD [facility] Pager [phone]  If 7PM-7AM, please contact night-coverage 07/24/2021, 9:04 AM

## 2021-07-25 ENCOUNTER — Inpatient Hospital Stay (HOSPITAL_COMMUNITY)
Admission: EM | Disposition: A | Discharge status: Home / Self Care | Payer: Self-pay | Source: Home / Self Care | Attending: Internal Medicine

## 2021-07-25 DIAGNOSIS — U071 COVID-19: Secondary | ICD-10-CM

## 2021-07-25 DIAGNOSIS — N186 End stage renal disease: Secondary | ICD-10-CM

## 2021-07-25 DIAGNOSIS — I2510 Atherosclerotic heart disease of native coronary artery without angina pectoris: Secondary | ICD-10-CM | POA: Diagnosis not present

## 2021-07-25 DIAGNOSIS — R072 Precordial pain: Secondary | ICD-10-CM

## 2021-07-25 DIAGNOSIS — I214 Non-ST elevation (NSTEMI) myocardial infarction: Principal | ICD-10-CM

## 2021-07-25 HISTORY — PX: LEFT HEART CATH AND CORONARY ANGIOGRAPHY: CATH118249

## 2021-07-25 HISTORY — PX: CORONARY STENT INTERVENTION: CATH118234

## 2021-07-25 LAB — CBC
HCT: 29.7 % — ABNORMAL LOW (ref 39.0–52.0)
Hemoglobin: 9.5 g/dL — ABNORMAL LOW (ref 13.0–17.0)
MCH: 29.2 pg (ref 26.0–34.0)
MCHC: 32 g/dL (ref 30.0–36.0)
MCV: 91.4 fL (ref 80.0–100.0)
Platelets: 82 10*3/uL — ABNORMAL LOW (ref 150–400)
RBC: 3.25 MIL/uL — ABNORMAL LOW (ref 4.22–5.81)
RDW: 13 % (ref 11.5–15.5)
WBC: 4.5 10*3/uL (ref 4.0–10.5)
nRBC: 0 % (ref 0.0–0.2)

## 2021-07-25 LAB — GLUCOSE, CAPILLARY
Glucose-Capillary: 87 mg/dL (ref 70–99)
Glucose-Capillary: 89 mg/dL (ref 70–99)
Glucose-Capillary: 145 mg/dL — ABNORMAL HIGH (ref 70–99)

## 2021-07-25 LAB — BASIC METABOLIC PANEL
Anion gap: 22 — ABNORMAL HIGH (ref 5–15)
BUN: 87 mg/dL — ABNORMAL HIGH (ref 8–23)
CO2: 18 mmol/L — ABNORMAL LOW (ref 22–32)
Calcium: 8.6 mg/dL — ABNORMAL LOW (ref 8.9–10.3)
Chloride: 92 mmol/L — ABNORMAL LOW (ref 98–111)
Creatinine, Ser: 20.76 mg/dL — ABNORMAL HIGH (ref 0.61–1.24)
Glucose, Bld: 111 mg/dL — ABNORMAL HIGH (ref 70–99)
Potassium: 3.6 mmol/L (ref 3.5–5.1)
Sodium: 132 mmol/L — ABNORMAL LOW (ref 135–145)

## 2021-07-25 LAB — HEPARIN LEVEL (UNFRACTIONATED): Heparin Unfractionated: 0.59 IU/mL (ref 0.30–0.70)

## 2021-07-25 LAB — POCT ACTIVATED CLOTTING TIME
Activated Clotting Time: 317 seconds
Activated Clotting Time: 293 seconds

## 2021-07-25 SURGERY — LEFT HEART CATH AND CORONARY ANGIOGRAPHY
Anesthesia: LOCAL

## 2021-07-25 MED ORDER — HEPARIN (PORCINE) IN NACL 1000-0.9 UT/500ML-% IV SOLN
INTRAVENOUS | Status: AC
  Filled 2021-07-25: qty 1000

## 2021-07-25 MED ORDER — MIDAZOLAM HCL 2 MG/2ML IJ SOLN
INTRAMUSCULAR | Status: DC | PRN
  Administered 2021-07-25 (×2): 1 mg via INTRAVENOUS

## 2021-07-25 MED ORDER — HEPARIN SODIUM (PORCINE) 1000 UNIT/ML IJ SOLN
INTRAMUSCULAR | Status: AC
  Filled 2021-07-25: qty 10

## 2021-07-25 MED ORDER — FLUTICASONE PROPIONATE 50 MCG/ACT NA SUSP
1.0000 | Freq: Every day | NASAL | Status: DC
  Filled 2021-07-25: qty 16

## 2021-07-25 MED ORDER — FENTANYL CITRATE (PF) 100 MCG/2ML IJ SOLN
INTRAMUSCULAR | Status: DC | PRN
  Administered 2021-07-25 (×2): 25 ug via INTRAVENOUS

## 2021-07-25 MED ORDER — TICAGRELOR 90 MG PO TABS
90.0000 mg | ORAL_TABLET | Freq: Two times a day (BID) | ORAL | Status: DC
  Administered 2021-07-26 – 2021-07-27 (×4): 90 mg via ORAL
  Filled 2021-07-25 (×4): qty 1

## 2021-07-25 MED ORDER — FLUTICASONE PROPIONATE 50 MCG/ACT NA SUSP
1.0000 | Freq: Every day | NASAL | Status: DC
  Administered 2021-07-25 – 2021-07-27 (×3): 1 via NASAL
  Filled 2021-07-25: qty 16

## 2021-07-25 MED ORDER — LABETALOL HCL 5 MG/ML IV SOLN: 10.0000 mg | INTRAVENOUS | Status: AC | PRN

## 2021-07-25 MED ORDER — FENTANYL CITRATE (PF) 100 MCG/2ML IJ SOLN
INTRAMUSCULAR | Status: AC
  Filled 2021-07-25: qty 2

## 2021-07-25 MED ORDER — NITROGLYCERIN 1 MG/10 ML FOR IR/CATH LAB
INTRA_ARTERIAL | Status: DC | PRN
  Administered 2021-07-25 (×3): 200 ug via INTRACORONARY

## 2021-07-25 MED ORDER — HEPARIN SODIUM (PORCINE) 1000 UNIT/ML IJ SOLN
INTRAMUSCULAR | Status: DC | PRN
  Administered 2021-07-25: 10000 [IU] via INTRAVENOUS

## 2021-07-25 MED ORDER — HEPARIN (PORCINE) IN NACL 1000-0.9 UT/500ML-% IV SOLN
INTRAVENOUS | Status: DC | PRN
  Administered 2021-07-25 (×2): 500 mL

## 2021-07-25 MED ORDER — SODIUM CHLORIDE 0.9% FLUSH
3.0000 mL | Freq: Two times a day (BID) | INTRAVENOUS | Status: DC
  Administered 2021-07-25 – 2021-07-27 (×4): 3 mL via INTRAVENOUS

## 2021-07-25 MED ORDER — VERAPAMIL HCL 2.5 MG/ML IV SOLN
INTRAVENOUS | Status: AC
  Filled 2021-07-25: qty 2

## 2021-07-25 MED ORDER — MIDAZOLAM HCL 2 MG/2ML IJ SOLN
INTRAMUSCULAR | Status: AC
  Filled 2021-07-25: qty 2

## 2021-07-25 MED ORDER — HYDRALAZINE HCL 20 MG/ML IJ SOLN: 10.0000 mg | INTRAMUSCULAR | Status: AC | PRN

## 2021-07-25 MED ORDER — IOHEXOL 350 MG/ML SOLN
INTRAVENOUS | Status: DC | PRN
  Administered 2021-07-25: 135 mL

## 2021-07-25 MED ORDER — ONDANSETRON HCL 4 MG/2ML IJ SOLN
INTRAMUSCULAR | Status: AC
  Filled 2021-07-25: qty 2

## 2021-07-25 MED ORDER — FENTANYL CITRATE PF 50 MCG/ML IJ SOSY: 50.0000 ug | PREFILLED_SYRINGE | INTRAMUSCULAR | Status: DC | PRN

## 2021-07-25 MED ORDER — SODIUM CHLORIDE 0.9% FLUSH
3.0000 mL | INTRAVENOUS | Status: DC | PRN
Start: 2021-07-25 — End: 2021-07-27

## 2021-07-25 MED ORDER — TICAGRELOR 90 MG PO TABS
ORAL_TABLET | ORAL | Status: DC | PRN
  Administered 2021-07-25: 180 mg via ORAL

## 2021-07-25 MED ORDER — TICAGRELOR 90 MG PO TABS
ORAL_TABLET | ORAL | Status: AC
  Filled 2021-07-25: qty 2

## 2021-07-25 MED ORDER — LIDOCAINE HCL (PF) 1 % IJ SOLN
INTRAMUSCULAR | Status: DC | PRN
  Administered 2021-07-25: 15 mL

## 2021-07-25 MED ORDER — SODIUM CHLORIDE 0.9 % IV SOLN
250.0000 mL | INTRAVENOUS | Status: DC | PRN
Start: 2021-07-25 — End: 2021-07-27

## 2021-07-25 MED ORDER — LIDOCAINE HCL (PF) 1 % IJ SOLN
INTRAMUSCULAR | Status: AC
  Filled 2021-07-25: qty 30

## 2021-07-25 MED ORDER — HEPARIN SODIUM (PORCINE) 5000 UNIT/ML IJ SOLN
5000.0000 [IU] | Freq: Three times a day (TID) | INTRAMUSCULAR | Status: DC
  Administered 2021-07-26 (×3): 5000 [IU] via SUBCUTANEOUS
  Filled 2021-07-25 (×3): qty 1

## 2021-07-25 SURGICAL SUPPLY — 24 items
BALLN SAPPHIRE 3.0X15 (BALLOONS) ×2
BALLN SAPPHIRE ~~LOC~~ 4.0X8 (BALLOONS) ×1 IMPLANT
BALLN SCOREFLEX 3.50X15 (BALLOONS) ×2
BALLN ~~LOC~~ EUPHORA RX 3.5X12 (BALLOONS) ×2
BALLOON SAPPHIRE 3.0X15 (BALLOONS) IMPLANT
BALLOON SCOREFLEX 3.50X15 (BALLOONS) IMPLANT
BALLOON ~~LOC~~ EUPHORA RX 3.5X12 (BALLOONS) IMPLANT
CATH INFINITI 5FR MULTPACK ANG (CATHETERS) ×1 IMPLANT
CLOSURE PERCLOSE PROSTYLE (VASCULAR PRODUCTS) ×1 IMPLANT
ELECT DEFIB PAD ADLT CADENCE (PAD) ×1 IMPLANT
GUIDE CATH MACH 1 7F AL1 (CATHETERS) ×1 IMPLANT
KIT ENCORE 26 ADVANTAGE (KITS) ×1 IMPLANT
KIT HEART LEFT (KITS) ×3 IMPLANT
KIT HEMO VALVE WATCHDOG (MISCELLANEOUS) ×1 IMPLANT
KIT MICROPUNCTURE NIT STIFF (SHEATH) ×1 IMPLANT
PACK CARDIAC CATHETERIZATION (CUSTOM PROCEDURE TRAY) ×3 IMPLANT
SHEATH PINNACLE 5F 10CM (SHEATH) ×1 IMPLANT
SHEATH PINNACLE 7F 10CM (SHEATH) ×1 IMPLANT
SHEATH PROBE COVER 6X72 (BAG) ×2 IMPLANT
STENT ONYX FRONTIER 3.5X15 (Permanent Stent) ×1 IMPLANT
TRANSDUCER W/STOPCOCK (MISCELLANEOUS) ×3 IMPLANT
TUBING CIL FLEX 10 FLL-RA (TUBING) ×3 IMPLANT
WIRE COUGAR XT STRL 190CM (WIRE) ×1 IMPLANT
WIRE EMERALD 3MM-J .035X150CM (WIRE) ×1 IMPLANT

## 2021-07-25 NOTE — H&P (View-Only) (Signed)
DAILY PROGRESS NOTE   Patient Name: [name] [name] Date of Encounter: 07/25/2021 Cardiologist: Pixie Casino, MD  Chief Complaint   No chest pain  Patient Profile   [name] [name] is a 71 y.o. male with a hx of CAD (mild on cath 03/2013), IDDM, hyperlipidemia, hypertension, morbid obesity, OSA, ESRD on PD who is being seen 07/24/2021 for the evaluation of NSTEMI at the request of Dr Roderic Palau.  Subjective   No chest pain overnight. Troponin peaked at 3428, now down-trending. Noted to be COVID positive. C. Diff antigen positive, but PCR negative. Echo shows LVEF 55-60% without regional WMA's.  Objective   Vitals:   07/24/21 1941 07/25/21 0454 07/25/21 0642 07/25/21 0907  BP: 120/83 110/73 104/64 125/72  Pulse: (!) 55 82  82  Resp: 16 16 19    Temp: 98 F (36.7 C) 98.5 F (36.9 C) 98.3 F (36.8 C)   TempSrc: Oral Oral    SpO2: 95% 97% 98%   Weight:  104.1 kg 100 kg   Height:        Intake/Output Summary (Last 24 hours) at 07/25/2021 0924 Last data filed at 07/25/2021 7829 Gross per 24 hour  Intake 8007 ml  Output --  Net 8007 ml   Filed Weights   07/24/21 0444 07/25/21 0454 07/25/21 0642  Weight: 105.2 kg 104.1 kg 100 kg    Physical Exam   General appearance: alert, no distress, and morbidly obese Lungs: clear to auscultation bilaterally Heart: regular rate and rhythm Extremities: extremities normal, atraumatic, no cyanosis or edema Neurologic: Grossly normal  Inpatient Medications    Scheduled Meds:  aspirin EC  81 mg Oral Daily   calcitRIOL  0.25 mcg Oral Daily   Chlorhexidine Gluconate Cloth  6 each Topical Daily   fluticasone  1 spray Each Nare Daily   gentamicin cream  1 application Topical Daily   insulin aspart  0-5 Units Subcutaneous QHS   insulin aspart  0-9 Units Subcutaneous TID WC   insulin detemir  20 Units Subcutaneous QHS   losartan  50 mg Oral Daily   metoprolol succinate  25 mg Oral Daily   mometasone-formoterol  2 puff Inhalation BID    pantoprazole  40 mg Oral BID AC   sodium chloride flush  3 mL Intravenous Q12H   torsemide  100 mg Oral Daily    Continuous Infusions:  sodium chloride     sodium chloride 10 mL/hr at 07/25/21 0532   dialysis solution 2.5% low-MG/low-CA     heparin 1,200 Units/hr (07/25/21 0502)    PRN Meds: sodium chloride, acetaminophen, alum & mag hydroxide-simeth, dianeal solution for CAPD/CCPD with heparin, Ipratropium-Albuterol, nitroGLYCERIN, ondansetron (ZOFRAN) IV, sodium chloride flush   Labs   Results for orders placed or performed during the hospital encounter of 07/22/21 (from the past 48 hour(s))  CBG monitoring, ED     Status: Abnormal   Collection Time: 07/23/21  1:14 PM  Result Value Ref Range   Glucose-Capillary 102 (H) 70 - 99 mg/dL    Comment: Glucose reference range applies only to samples taken after fasting for at least 8 hours.  Heparin level (unfractionated)     Status: None   Collection Time: 07/23/21  4:25 PM  Result Value Ref Range   Heparin Unfractionated 0.54 0.30 - 0.70 IU/mL    Comment: (NOTE) The clinical reportable range upper limit is being lowered to >1.10 to align with the FDA approved guidance for the current laboratory assay.  If heparin  results are below expected values, and patient dosage has  been confirmed, suggest follow up testing of antithrombin III levels. Performed at [facility], [place] [address] [place], [place] [address]   Glucose, capillary     Status: None   Collection Time: 07/23/21  4:33 PM  Result Value Ref Range   Glucose-Capillary 86 70 - 99 mg/dL    Comment: Glucose reference range applies only to samples taken after fasting for at least 8 hours.  Glucose, capillary     Status: None   Collection Time: 07/23/21  7:51 PM  Result Value Ref Range   Glucose-Capillary 86 70 - 99 mg/dL    Comment: Glucose reference range applies only to samples taken after fasting for at least 8 hours.  CBC     Status: Abnormal   Collection  Time: 07/24/21  2:54 AM  Result Value Ref Range   WBC 3.5 (L) 4.0 - 10.5 K/uL   RBC 3.37 (L) 4.22 - 5.81 MIL/uL   Hemoglobin 10.1 (L) 13.0 - 17.0 g/dL   HCT 30.9 (L) 39.0 - 52.0 %   MCV 91.7 80.0 - 100.0 fL   MCH 30.0 26.0 - 34.0 pg   MCHC 32.7 30.0 - 36.0 g/dL   RDW 13.2 11.5 - 15.5 %   Platelets 80 (L) 150 - 400 K/uL    Comment: Immature Platelet Fraction may be clinically indicated, consider ordering this additional test [id number] REPEATED TO VERIFY PLATELET COUNT CONFIRMED BY SMEAR    nRBC 0.0 0.0 - 0.2 %    Comment: Performed at [facility], [place] [address], [place], [place] [address]  Heparin level (unfractionated)     Status: None   Collection Time: 07/24/21  2:54 AM  Result Value Ref Range   Heparin Unfractionated 0.63 0.30 - 0.70 IU/mL    Comment: (NOTE) The clinical reportable range upper limit is being lowered to >1.10 to align with the FDA approved guidance for the current laboratory assay.  If heparin results are below expected values, and patient dosage has  been confirmed, suggest follow up testing of antithrombin III levels. Performed at [facility], [place] [address], [place], [place] [address]   Renal function panel     Status: Abnormal   Collection Time: 07/24/21  2:54 AM  Result Value Ref Range   Sodium 130 (L) 135 - 145 mmol/L   Potassium 3.3 (L) 3.5 - 5.1 mmol/L   Chloride 89 (L) 98 - 111 mmol/L   CO2 26 22 - 32 mmol/L   Glucose, Bld 97 70 - 99 mg/dL    Comment: Glucose reference range applies only to samples taken after fasting for at least 8 hours.   BUN 73 (H) 8 - 23 mg/dL   Creatinine, Ser 20.21 (H) 0.61 - 1.24 mg/dL   Calcium 9.0 8.9 - 10.3 mg/dL   Phosphorus 8.8 (H) 2.5 - 4.6 mg/dL   Albumin 2.8 (L) 3.5 - 5.0 g/dL   GFR, Estimated 2 (L) >60 mL/min    Comment: (NOTE) Calculated using the CKD-EPI Creatinine Equation (2021)    Anion gap 15 5 - 15    Comment: Performed at [facility] [address], [place], [place]  [address]  Hepatic function panel     Status: Abnormal   Collection Time: 07/24/21  2:54 AM  Result Value Ref Range   Total Protein 6.5 6.5 - 8.1 g/dL   Albumin 3.0 (L) 3.5 - 5.0 g/dL  AST 22 15 - 41 U/L   ALT 19 0 - 44 U/L   Alkaline Phosphatase 67 38 - 126 U/L   Total Bilirubin 0.5 0.3 - 1.2 mg/dL   Bilirubin, Direct <0.1 0.0 - 0.2 mg/dL   Indirect Bilirubin NOT CALCULATED 0.3 - 0.9 mg/dL    Comment: Performed at [facility] [address], [place], [place] [address]  Glucose, capillary     Status: None   Collection Time: 07/24/21  8:23 AM  Result Value Ref Range   Glucose-Capillary 87 70 - 99 mg/dL    Comment: Glucose reference range applies only to samples taken after fasting for at least 8 hours.  Glucose, capillary     Status: Abnormal   Collection Time: 07/24/21 12:16 PM  Result Value Ref Range   Glucose-Capillary 126 (H) 70 - 99 mg/dL    Comment: Glucose reference range applies only to samples taken after fasting for at least 8 hours.  Glucose, capillary     Status: Abnormal   Collection Time: 07/24/21  4:06 PM  Result Value Ref Range   Glucose-Capillary 106 (H) 70 - 99 mg/dL    Comment: Glucose reference range applies only to samples taken after fasting for at least 8 hours.  Glucose, capillary     Status: Abnormal   Collection Time: 07/24/21  7:46 PM  Result Value Ref Range   Glucose-Capillary 103 (H) 70 - 99 mg/dL    Comment: Glucose reference range applies only to samples taken after fasting for at least 8 hours.  CBC     Status: Abnormal   Collection Time: 07/25/21  2:01 AM  Result Value Ref Range   WBC 4.5 4.0 - 10.5 K/uL   RBC 3.25 (L) 4.22 - 5.81 MIL/uL   Hemoglobin 9.5 (L) 13.0 - 17.0 g/dL   HCT 29.7 (L) 39.0 - 52.0 %   MCV 91.4 80.0 - 100.0 fL   MCH 29.2 26.0 - 34.0 pg   MCHC 32.0 30.0 - 36.0 g/dL   RDW 13.0 11.5 - 15.5 %   Platelets 82 (L) 150 - 400 K/uL    Comment: Immature Platelet Fraction may be clinically indicated, consider ordering this  additional test [id number] CONSISTENT WITH PREVIOUS RESULT REPEATED TO VERIFY    nRBC 0.0 0.0 - 0.2 %    Comment: Performed at [facility], [place] [address], [place], [place] [address]  Heparin level (unfractionated)     Status: None   Collection Time: 07/25/21  2:01 AM  Result Value Ref Range   Heparin Unfractionated 0.59 0.30 - 0.70 IU/mL    Comment: (NOTE) The clinical reportable range upper limit is being lowered to >1.10 to align with the FDA approved guidance for the current laboratory assay.  If heparin results are below expected values, and patient dosage has  been confirmed, suggest follow up testing of antithrombin III levels. Performed at [facility], [place] [address] [place], [place] [address]   Basic metabolic panel     Status: Abnormal   Collection Time: 07/25/21  2:01 AM  Result Value Ref Range   Sodium 132 (L) 135 - 145 mmol/L   Potassium 3.6 3.5 - 5.1 mmol/L   Chloride 92 (L) 98 - 111 mmol/L   CO2 18 (L) 22 - 32 mmol/L   Glucose, Bld 111 (H) 70 - 99 mg/dL    Comment: Glucose reference range applies only to samples taken after fasting for at least 8 hours.  BUN 87 (H) 8 - 23 mg/dL   Creatinine, Ser 20.76 (H) 0.61 - 1.24 mg/dL   Calcium 8.6 (L) 8.9 - 10.3 mg/dL   GFR, Estimated NOT CALCULATED >60 mL/min    Comment: (NOTE) Calculated using the CKD-EPI Creatinine Equation (2021)    Anion gap 22 (H) 5 - 15    Comment: Electrolytes repeated to confirm. Performed at [facility], [place] [address] [place], [place] [address]   Glucose, capillary     Status: None   Collection Time: 07/25/21  8:10 AM  Result Value Ref Range   Glucose-Capillary 87 70 - 99 mg/dL    Comment: Glucose reference range applies only to samples taken after fasting for at least 8 hours.    ECG   Sinus rhythm with 1st degree AVB, LBBB at 72 - Personally Reviewed  Telemetry   Sinus rhythm with LBBB - Personally Reviewed  Radiology    No results found.  Cardiac  Studies   Patient Name:   [name] [name] Date of Exam: 07/23/2021  Medical Rec #:  [id number]      Height:       64.0 in  Accession #:    [id number]     Weight:       230.0 lb  Date of Birth:  1951/06/12     BSA:          2.076 m  Patient Age:    65 years       BP:           122/61 mmHg  Patient Gender: M              HR:           83 bpm.  Exam Location:  [facility]   Procedure: 2D Echo, Cardiac Doppler and Color Doppler   Indications:    Chest Pain     History:        Patient has prior history of Echocardiogram examinations,  most                  recent 08/29/2018. CAD and Previous Myocardial Infarction,                  Signs/Symptoms:Chest Pain; Risk Factors:Hypertension,  Diabetes,                  Dyslipidemia and Former Smoker. COVID +.     Sonographer:    Wenda Low  Referring Phys: [place]     1. Left ventricular ejection fraction, by estimation, is 55 to 60%. The  left ventricle has normal function. The left ventricle has no regional  wall motion abnormalities. There is severe left ventricular hypertrophy.  Left ventricular diastolic parameters   are consistent with Grade I diastolic dysfunction (impaired relaxation).  Elevated left atrial pressure.   2. Right ventricular systolic function is normal. The right ventricular  size is normal. There is normal pulmonary artery systolic pressure.   3. Left atrial size was mildly dilated.   4. Mild mitral valve regurgitation.   5. AV is thickened with minimally restricted motion. Peak and mean  gradients through the valve aer 19 and 10 mm Hg respectively. . The aortic  valve is tricuspid. Aortic valve regurgitation is trivial. Aortic valve  sclerosis is present, with no evidence  of aortic valve stenosis.  Assessment   Principal Problem:   NSTEMI (non-ST elevated myocardial infarction) [facility]) Active Problems:  Hyperlipidemia   CAD (coronary artery disease)   Coronary artery disease  involving native coronary artery of native heart without angina pectoris   End stage renal disease (HCC)   Chest pain   Plan   NSTEMI [name] [name] presents with chest pain and NSTEMI with troponin to the 3000's now downtrending.  He has end-stage renal disease on home peritoneal dialysis.  He has a history of some mild coronary disease by cath in 2014 but is done well on medical therapy.  Based on these findings he will need a cardiac catheterization which is scheduled today.  He is incidentally found to be COVID-positive however does not appear to have a pneumonia or active respiratory symptoms.  This however may have contributed to his MI.  Echo was reassuring with normal systolic function and no regional wall motion abnormalities. Plan left heart cath today Continue aspirin, heparin, losartan and metoprolol. Continue torsemide as per nephrology ESRD on PD Management per nephrology, on losartan and torsemide COVID+ Supportive care, not currently on treatment for COVID-19.  Time Spent Directly with Patient:  I have spent a total of 25 minutes with the patient reviewing hospital notes, telemetry, EKGs, labs and examining the patient as well as establishing an assessment and plan that was discussed personally with the patient.  > 50% of time was spent in direct patient care.  Length of Stay:  LOS: 3 days   Pixie Casino, MD, [facility], [place] Director of the Advanced Lipid Disorders &  Cardiovascular Risk Reduction Clinic Diplomate of the American Board of Clinical Lipidology Attending Cardiologist  Direct Dial: [phone]   Fax: [phone]  Website:  [email or web address] 07/25/2021, 9:24 AM

## 2021-07-25 NOTE — Progress Notes (Signed)
ANTICOAGULATION CONSULT NOTE - Follow Up Consult  Pharmacy Consult for Heparin Indication: chest pain/ACS  Allergies  Allergen Reactions   Opana [Oxymorphone Hcl] Itching   Oxymorphone Itching   Tramadol Itching    Patient Measurements: Height: 5\' 4"  (162.6 cm) Weight: 100 kg (220 lb 7.4 oz) IBW/kg (Calculated) : 59.2 Heparin Dosing Weight: 83.4 kg  Vital Signs: Temp: 98.3 F (36.8 C) (01/09 0642) Temp Source: Oral (01/09 0454) BP: 125/72 (01/09 0907) Pulse Rate: 82 (01/09 0907)  Labs: Recent Labs    07/22/21 1416 07/22/21 1600 07/22/21 2329 07/23/21 0532 07/23/21 0842 07/23/21 1625 07/24/21 0254 07/25/21 0201  HGB 10.2*  --   --  9.5*  --   --  10.1* 9.5*  HCT 31.1*  --   --  28.9*  --   --  30.9* 29.7*  PLT 100*  --   --  83*  --   --  80* 82*  HEPARINUNFRC  --   --    < >  --    < > 0.54 0.63 0.59  CREATININE 17.84*  --   --   --   --   --  20.21* 20.76*  TROPONINIHS 3,428* 3,386*  --   --   --   --   --   --    < > = values in this interval not displayed.     Estimated Creatinine Clearance: 3.5 mL/min (A) (by C-G formula based on SCr of 20.76 mg/dL (H)).   Medications:  Scheduled:   aspirin EC  81 mg Oral Daily   calcitRIOL  0.25 mcg Oral Daily   Chlorhexidine Gluconate Cloth  6 each Topical Daily   fluticasone  1 spray Each Nare Daily   gentamicin cream  1 application Topical Daily   insulin aspart  0-5 Units Subcutaneous QHS   insulin aspart  0-9 Units Subcutaneous TID WC   insulin detemir  20 Units Subcutaneous QHS   losartan  50 mg Oral Daily   metoprolol succinate  25 mg Oral Daily   mometasone-formoterol  2 puff Inhalation BID   pantoprazole  40 mg Oral BID AC   sodium chloride flush  3 mL Intravenous Q12H   torsemide  100 mg Oral Daily   Infusions:   sodium chloride     sodium chloride 10 mL/hr at 07/25/21 0532   dialysis solution 2.5% low-MG/low-CA     heparin 1,200 Units/hr (07/25/21 0502)    Assessment: 71 yo M with history of  CAD (mild on cath 03/2013), IDDM, hyperlipidemia, hypertension, morbid obesity, OSA, ESRD on PD who presents with chest pain. Patient is not on anticoagulation PTA. Pharmacy consulted for heparin dosing.  Heparin level today is therapeutic at 0.59, on 1200 units/hr.  Plans for cath today  Goal of Therapy:  Heparin level 0.3-0.7 units/ml Monitor platelets by anticoagulation protocol: Yes   Plan:  Continue heparin gtt at 1200 units/hr. Will follow plans post cath  Hildred Laser, PharmD Clinical Pharmacist **Pharmacist phone directory can now be found on [email or web address] ([email or web address]).  Listed under [place].

## 2021-07-25 NOTE — Telephone Encounter (Signed)
Noted.  Rx sent.

## 2021-07-25 NOTE — Interval H&P Note (Signed)
History and Physical Interval Note:  07/25/2021 3:07 PM  [name] [name]  has presented today for surgery, with the diagnosis of NSTEMI.  The various methods of treatment have been discussed with the patient and family. After consideration of risks, benefits and other options for treatment, the patient has consented to  Procedure(s): LEFT HEART CATH AND CORONARY ANGIOGRAPHY (N/A) as a surgical intervention.  The patient's history has been reviewed, patient examined, no change in status, stable for surgery.  I have reviewed the patient's chart and labs.  Questions were answered to the patient's satisfaction.     Sherren Mocha

## 2021-07-25 NOTE — Progress Notes (Signed)
[facility] Progress Note  Name: [name] [name] MRN: [id number] DOB: 03/17/1951  Subjective: For LHC today.  No CP at present.  Still having diarrhea    Intake/Output Summary (Last 24 hours) at 07/25/2021 1408 Last data filed at 07/25/2021 5670 Gross per 24 hour  Intake 8007 ml  Output --  Net 8007 ml    Vitals:  Vitals:   07/24/21 1941 07/25/21 0454 07/25/21 0642 07/25/21 0907  BP: 120/83 110/73 104/64 125/72  Pulse: (!) 55 82  82  Resp: 16 16 19    Temp: 98 F (36.7 C) 98.5 F (36.9 C) 98.3 F (36.8 C)   TempSrc: Oral Oral    SpO2: 95% 97% 98%   Weight:  104.1 kg 100 kg   Height:         Physical Exam:  GEN: NAD Neck supple trachea midline Lungs clear Cardiology RRR Abdomen soft nontender nondistended Extremities no edema lower extremities Psych normal mood and affect Neuro alert and oriented x 3 provides hx and follows commands Access: tenckhoff catheter in place bandaged; LUE AVF present with thrill     Medications reviewed   Labs:  BMP Latest Ref Rng & Units 07/25/2021 07/24/2021 07/22/2021  Glucose 70 - 99 mg/dL 111(H) 97 145(H)  BUN 8 - 23 mg/dL 87(H) 73(H) 59(H)  Creatinine 0.61 - 1.24 mg/dL 20.76(H) 20.21(H) 17.84(H)  BUN/Creat Ratio 6 - 22 (calc) - - -  Sodium 135 - 145 mmol/L 132(L) 130(L) 135  Potassium 3.5 - 5.1 mmol/L 3.6 3.3(L) 3.7  Chloride 98 - 111 mmol/L 92(L) 89(L) 92(L)  CO2 22 - 32 mmol/L 18(L) 26 27  Calcium 8.9 - 10.3 mg/dL 8.6(L) 9.0 9.2     Assessment/Plan:   # NSTEMI - per cardiology  - per note planning for left heart cath 07/25/21   # Covid positive - therapies per primary team    # ESRD on PD - continue PD here - all 2.5% today given NPO and still having some diarrhea   # HTN - controlled. UF with PD as above  # Hypokalemia - potassium 20 meq once    # Anemia CKD - will need to request outpatient records. Hold ESA in setting of NSTEMI   # Secondary hyperpara/metabolic bone disease - will need to request  outpatient records. Start calcitriol 0.25 mcg daily.  Hyperphos - no binder on med list.  He can't remember what he takes and is going to call his wife to check when she's home  # thrombocytopenia - per primary team    Madelon Lips, MD 07/25/2021 2:08 PM

## 2021-07-25 NOTE — Progress Notes (Signed)
DAILY PROGRESS NOTE   Patient Name: [name] [name] Date of Encounter: 07/25/2021 Cardiologist: Pixie Casino, MD  Chief Complaint   No chest pain  Patient Profile   [name] [name] is a 71 y.o. male with a hx of CAD (mild on cath 03/2013), IDDM, hyperlipidemia, hypertension, morbid obesity, OSA, ESRD on PD who is being seen 07/24/2021 for the evaluation of NSTEMI at the request of Dr Roderic Palau.  Subjective   No chest pain overnight. Troponin peaked at 3428, now down-trending. Noted to be COVID positive. C. Diff antigen positive, but PCR negative. Echo shows LVEF 55-60% without regional WMA's.  Objective   Vitals:   07/24/21 1941 07/25/21 0454 07/25/21 0642 07/25/21 0907  BP: 120/83 110/73 104/64 125/72  Pulse: (!) 55 82  82  Resp: 16 16 19    Temp: 98 F (36.7 C) 98.5 F (36.9 C) 98.3 F (36.8 C)   TempSrc: Oral Oral    SpO2: 95% 97% 98%   Weight:  104.1 kg 100 kg   Height:        Intake/Output Summary (Last 24 hours) at 07/25/2021 0924 Last data filed at 07/25/2021 5625 Gross per 24 hour  Intake 8007 ml  Output --  Net 8007 ml   Filed Weights   07/24/21 0444 07/25/21 0454 07/25/21 0642  Weight: 105.2 kg 104.1 kg 100 kg    Physical Exam   General appearance: alert, no distress, and morbidly obese Lungs: clear to auscultation bilaterally Heart: regular rate and rhythm Extremities: extremities normal, atraumatic, no cyanosis or edema Neurologic: Grossly normal  Inpatient Medications    Scheduled Meds:  aspirin EC  81 mg Oral Daily   calcitRIOL  0.25 mcg Oral Daily   Chlorhexidine Gluconate Cloth  6 each Topical Daily   fluticasone  1 spray Each Nare Daily   gentamicin cream  1 application Topical Daily   insulin aspart  0-5 Units Subcutaneous QHS   insulin aspart  0-9 Units Subcutaneous TID WC   insulin detemir  20 Units Subcutaneous QHS   losartan  50 mg Oral Daily   metoprolol succinate  25 mg Oral Daily   mometasone-formoterol  2 puff Inhalation BID    pantoprazole  40 mg Oral BID AC   sodium chloride flush  3 mL Intravenous Q12H   torsemide  100 mg Oral Daily    Continuous Infusions:  sodium chloride     sodium chloride 10 mL/hr at 07/25/21 0532   dialysis solution 2.5% low-MG/low-CA     heparin 1,200 Units/hr (07/25/21 0502)    PRN Meds: sodium chloride, acetaminophen, alum & mag hydroxide-simeth, dianeal solution for CAPD/CCPD with heparin, Ipratropium-Albuterol, nitroGLYCERIN, ondansetron (ZOFRAN) IV, sodium chloride flush   Labs   Results for orders placed or performed during the hospital encounter of 07/22/21 (from the past 48 hour(s))  CBG monitoring, ED     Status: Abnormal   Collection Time: 07/23/21  1:14 PM  Result Value Ref Range   Glucose-Capillary 102 (H) 70 - 99 mg/dL    Comment: Glucose reference range applies only to samples taken after fasting for at least 8 hours.  Heparin level (unfractionated)     Status: None   Collection Time: 07/23/21  4:25 PM  Result Value Ref Range   Heparin Unfractionated 0.54 0.30 - 0.70 IU/mL    Comment: (NOTE) The clinical reportable range upper limit is being lowered to >1.10 to align with the FDA approved guidance for the current laboratory assay.  If heparin  results are below expected values, and patient dosage has  been confirmed, suggest follow up testing of antithrombin III levels. Performed at [facility], [place] [address], [place], [place] [address]   Glucose, capillary     Status: None   Collection Time: 07/23/21  4:33 PM  Result Value Ref Range   Glucose-Capillary 86 70 - 99 mg/dL    Comment: Glucose reference range applies only to samples taken after fasting for at least 8 hours.  Glucose, capillary     Status: None   Collection Time: 07/23/21  7:51 PM  Result Value Ref Range   Glucose-Capillary 86 70 - 99 mg/dL    Comment: Glucose reference range applies only to samples taken after fasting for at least 8 hours.  CBC     Status: Abnormal   Collection  Time: 07/24/21  2:54 AM  Result Value Ref Range   WBC 3.5 (L) 4.0 - 10.5 K/uL   RBC 3.37 (L) 4.22 - 5.81 MIL/uL   Hemoglobin 10.1 (L) 13.0 - 17.0 g/dL   HCT 30.9 (L) 39.0 - 52.0 %   MCV 91.7 80.0 - 100.0 fL   MCH 30.0 26.0 - 34.0 pg   MCHC 32.7 30.0 - 36.0 g/dL   RDW 13.2 11.5 - 15.5 %   Platelets 80 (L) 150 - 400 K/uL    Comment: Immature Platelet Fraction may be clinically indicated, consider ordering this additional test [id number] REPEATED TO VERIFY PLATELET COUNT CONFIRMED BY SMEAR    nRBC 0.0 0.0 - 0.2 %    Comment: Performed at [facility], [place] [address], [place], [place] [address]  Heparin level (unfractionated)     Status: None   Collection Time: 07/24/21  2:54 AM  Result Value Ref Range   Heparin Unfractionated 0.63 0.30 - 0.70 IU/mL    Comment: (NOTE) The clinical reportable range upper limit is being lowered to >1.10 to align with the FDA approved guidance for the current laboratory assay.  If heparin results are below expected values, and patient dosage has  been confirmed, suggest follow up testing of antithrombin III levels. Performed at [facility], [place] [address], [place], [place] [address]   Renal function panel     Status: Abnormal   Collection Time: 07/24/21  2:54 AM  Result Value Ref Range   Sodium 130 (L) 135 - 145 mmol/L   Potassium 3.3 (L) 3.5 - 5.1 mmol/L   Chloride 89 (L) 98 - 111 mmol/L   CO2 26 22 - 32 mmol/L   Glucose, Bld 97 70 - 99 mg/dL    Comment: Glucose reference range applies only to samples taken after fasting for at least 8 hours.   BUN 73 (H) 8 - 23 mg/dL   Creatinine, Ser 20.21 (H) 0.61 - 1.24 mg/dL   Calcium 9.0 8.9 - 10.3 mg/dL   Phosphorus 8.8 (H) 2.5 - 4.6 mg/dL   Albumin 2.8 (L) 3.5 - 5.0 g/dL   GFR, Estimated 2 (L) >60 mL/min    Comment: (NOTE) Calculated using the CKD-EPI Creatinine Equation (2021)    Anion gap 15 5 - 15    Comment: Performed at [place] [address] [place], [place]  [address]  Hepatic function panel     Status: Abnormal   Collection Time: 07/24/21  2:54 AM  Result Value Ref Range   Total Protein 6.5 6.5 - 8.1 g/dL   Albumin 3.0 (L) 3.5 - 5.0 g/dL  AST 22 15 - 41 U/L   ALT 19 0 - 44 U/L   Alkaline Phosphatase 67 38 - 126 U/L   Total Bilirubin 0.5 0.3 - 1.2 mg/dL   Bilirubin, Direct <0.1 0.0 - 0.2 mg/dL   Indirect Bilirubin NOT CALCULATED 0.3 - 0.9 mg/dL    Comment: Performed at [facility] [address] [place], [place] [address]  Glucose, capillary     Status: None   Collection Time: 07/24/21  8:23 AM  Result Value Ref Range   Glucose-Capillary 87 70 - 99 mg/dL    Comment: Glucose reference range applies only to samples taken after fasting for at least 8 hours.  Glucose, capillary     Status: Abnormal   Collection Time: 07/24/21 12:16 PM  Result Value Ref Range   Glucose-Capillary 126 (H) 70 - 99 mg/dL    Comment: Glucose reference range applies only to samples taken after fasting for at least 8 hours.  Glucose, capillary     Status: Abnormal   Collection Time: 07/24/21  4:06 PM  Result Value Ref Range   Glucose-Capillary 106 (H) 70 - 99 mg/dL    Comment: Glucose reference range applies only to samples taken after fasting for at least 8 hours.  Glucose, capillary     Status: Abnormal   Collection Time: 07/24/21  7:46 PM  Result Value Ref Range   Glucose-Capillary 103 (H) 70 - 99 mg/dL    Comment: Glucose reference range applies only to samples taken after fasting for at least 8 hours.  CBC     Status: Abnormal   Collection Time: 07/25/21  2:01 AM  Result Value Ref Range   WBC 4.5 4.0 - 10.5 K/uL   RBC 3.25 (L) 4.22 - 5.81 MIL/uL   Hemoglobin 9.5 (L) 13.0 - 17.0 g/dL   HCT 29.7 (L) 39.0 - 52.0 %   MCV 91.4 80.0 - 100.0 fL   MCH 29.2 26.0 - 34.0 pg   MCHC 32.0 30.0 - 36.0 g/dL   RDW 13.0 11.5 - 15.5 %   Platelets 82 (L) 150 - 400 K/uL    Comment: Immature Platelet Fraction may be clinically indicated, consider ordering this  additional test [id number] CONSISTENT WITH PREVIOUS RESULT REPEATED TO VERIFY    nRBC 0.0 0.0 - 0.2 %    Comment: Performed at [facility], [place] [address], [place], [place] [address]  Heparin level (unfractionated)     Status: None   Collection Time: 07/25/21  2:01 AM  Result Value Ref Range   Heparin Unfractionated 0.59 0.30 - 0.70 IU/mL    Comment: (NOTE) The clinical reportable range upper limit is being lowered to >1.10 to align with the FDA approved guidance for the current laboratory assay.  If heparin results are below expected values, and patient dosage has  been confirmed, suggest follow up testing of antithrombin III levels. Performed at [facility], [place] [address] [place], [place] [address]   Basic metabolic panel     Status: Abnormal   Collection Time: 07/25/21  2:01 AM  Result Value Ref Range   Sodium 132 (L) 135 - 145 mmol/L   Potassium 3.6 3.5 - 5.1 mmol/L   Chloride 92 (L) 98 - 111 mmol/L   CO2 18 (L) 22 - 32 mmol/L   Glucose, Bld 111 (H) 70 - 99 mg/dL    Comment: Glucose reference range applies only to samples taken after fasting for at least 8 hours.  BUN 87 (H) 8 - 23 mg/dL   Creatinine, Ser 20.76 (H) 0.61 - 1.24 mg/dL   Calcium 8.6 (L) 8.9 - 10.3 mg/dL   GFR, Estimated NOT CALCULATED >60 mL/min    Comment: (NOTE) Calculated using the CKD-EPI Creatinine Equation (2021)    Anion gap 22 (H) 5 - 15    Comment: Electrolytes repeated to confirm. Performed at [facility], [place] [address], [place], [place] [address]   Glucose, capillary     Status: None   Collection Time: 07/25/21  8:10 AM  Result Value Ref Range   Glucose-Capillary 87 70 - 99 mg/dL    Comment: Glucose reference range applies only to samples taken after fasting for at least 8 hours.    ECG   Sinus rhythm with 1st degree AVB, LBBB at 72 - Personally Reviewed  Telemetry   Sinus rhythm with LBBB - Personally Reviewed  Radiology    No results found.  Cardiac  Studies   Patient Name:   ONYX SCHIRMER Date of Exam: 07/23/2021  Medical Rec #:  [id number]      Height:       64.0 in  Accession #:    [id number]     Weight:       230.0 lb  Date of Birth:  10-21-1950     BSA:          2.076 m  Patient Age:    38 years       BP:           122/61 mmHg  Patient Gender: M              HR:           83 bpm.  Exam Location:  [facility]   Procedure: 2D Echo, Cardiac Doppler and Color Doppler   Indications:    Chest Pain     History:        Patient has prior history of Echocardiogram examinations,  most                  recent 08/29/2018. CAD and Previous Myocardial Infarction,                  Signs/Symptoms:Chest Pain; Risk Factors:Hypertension,  Diabetes,                  Dyslipidemia and Former Smoker. COVID +.     Sonographer:    Wenda Low  Referring Phys: Elizabeth     1. Left ventricular ejection fraction, by estimation, is 55 to 60%. The  left ventricle has normal function. The left ventricle has no regional  wall motion abnormalities. There is severe left ventricular hypertrophy.  Left ventricular diastolic parameters   are consistent with Grade I diastolic dysfunction (impaired relaxation).  Elevated left atrial pressure.   2. Right ventricular systolic function is normal. The right ventricular  size is normal. There is normal pulmonary artery systolic pressure.   3. Left atrial size was mildly dilated.   4. Mild mitral valve regurgitation.   5. AV is thickened with minimally restricted motion. Peak and mean  gradients through the valve aer 19 and 10 mm Hg respectively. . The aortic  valve is tricuspid. Aortic valve regurgitation is trivial. Aortic valve  sclerosis is present, with no evidence  of aortic valve stenosis.  Assessment   Principal Problem:   NSTEMI (non-ST elevated myocardial infarction) [facility]) Active Problems:  Hyperlipidemia   CAD (coronary artery disease)   Coronary artery disease  involving native coronary artery of native heart without angina pectoris   End stage renal disease (HCC)   Chest pain   Plan   NSTEMI Mr. Barnhill presents with chest pain and NSTEMI with troponin to the 3000's now downtrending.  He has end-stage renal disease on home peritoneal dialysis.  He has a history of some mild coronary disease by cath in 2014 but is done well on medical therapy.  Based on these findings he will need a cardiac catheterization which is scheduled today.  He is incidentally found to be COVID-positive however does not appear to have a pneumonia or active respiratory symptoms.  This however may have contributed to his MI.  Echo was reassuring with normal systolic function and no regional wall motion abnormalities. Plan left heart cath today Continue aspirin, heparin, losartan and metoprolol. Continue torsemide as per nephrology ESRD on PD Management per nephrology, on losartan and torsemide COVID+ Supportive care, not currently on treatment for COVID-19.  Time Spent Directly with Patient:  I have spent a total of 25 minutes with the patient reviewing hospital notes, telemetry, EKGs, labs and examining the patient as well as establishing an assessment and plan that was discussed personally with the patient.  > 50% of time was spent in direct patient care.  Length of Stay:  LOS: 3 days   Pixie Casino, MD, [facility], [place] Director of the [facility]  [facility] Diplomate of the American Board of Clinical Lipidology Attending Cardiologist  Direct Dial: [phone]   Fax: [phone]  Website:  [email or web address] [email or web address] 07/25/2021, 9:24 AM

## 2021-07-25 NOTE — Plan of Care (Signed)
°  Problem: Nutrition: °Goal: Adequate nutrition will be maintained °Outcome: Progressing °  °Problem: Coping: °Goal: Level of anxiety will decrease °Outcome: Progressing °  °Problem: Safety: °Goal: Ability to remain free from injury will improve °Outcome: Progressing °  °

## 2021-07-25 NOTE — Progress Notes (Signed)
Cath lab nurse at bedside. Right groin sight swollen. Pressure being applied.

## 2021-07-25 NOTE — Progress Notes (Signed)
PROGRESS NOTE    [name] [name]  OEV:[id number] DOB: Apr 08, 1951 DOA: 07/22/2021 PCP: [name], MD    Brief Narrative:   [name] [name]  is a 71 y.o. male, with past medical history of ESRD, on peritoneal dialysis, diabetes mellitus, hypertension, hyperlipidemia, CAD, with mild on cardiac cath 03/2013. -Patient presents to ED secondary to complaints of chest pain, reports in the center of his chest, started yesterday morning, has worsened in the evening with minimal activity as he was loading some wood into a wheelbarrow, reports chest pain is severe, 10 out of 10 intensity, accompanied by some nausea, and vomiting, and some dyspnea as well, he called cardiology office who recommended him to come to ED, chest pain 5 out of 10 on presentation, but he is currently chest pain-free,. -In ED work-up significant for EKG with sinus rhythm, Mobitz 1 second-degree AV block, blood pressure 109/71 on presentation, saturating 93% on room air, initial troponin significantly elevated at 3428, creatinine of 18, sodium of 135, hemoglobin of 10, platelet of 100 K, chest x-ray with no acute findings, patient was seen by cardiology where he started on aspirin, full dose heparin for NSTEMI, and recommendation has been made to transfer to [facility] for [facility].      1/9 plan for cath this a.m.   Consultants:  Nephrology, cardiology  Procedures:PD  Antimicrobials:     Subjective: Denies chest pain or shortness of breath.  Still with diarrhea.  Discussed with patient this is likely from his [place].  Objective: Vitals:   07/25/21 0454 07/25/21 0642 07/25/21 0907 07/25/21 1510  BP: 110/73 104/64 125/72   Pulse: 82  82   Resp: 16 19    Temp: 98.5 F (36.9 C) 98.3 F (36.8 C)    TempSrc: Oral     SpO2: 97% 98%  99%  Weight: 104.1 kg 100 kg    Height:        Intake/Output Summary (Last 24 hours) at 07/25/2021 1521 Last data filed at 07/25/2021 8299 Gross per 24 hour  Intake 8007 ml  Output --  Net  8007 ml   Filed Weights   07/24/21 0444 07/25/21 0454 07/25/21 0642  Weight: 105.2 kg 104.1 kg 100 kg    Examination: NAD, calm CTA no wheezing Regular S1-S2 no gallops Soft benign positive bowel sounds Positive edema Awake and oriented x3   Data Reviewed: I have personally reviewed following labs and imaging studies  CBC: Recent Labs  Lab 07/22/21 1416 07/23/21 0532 07/24/21 0254 07/25/21 0201  WBC 5.0 4.3 3.5* 4.5  HGB 10.2* 9.5* 10.1* 9.5*  HCT 31.1* 28.9* 30.9* 29.7*  MCV 94.0 92.9 91.7 91.4  PLT 100* 83* 80* 82*   Basic Metabolic Panel: Recent Labs  Lab 07/22/21 1416 07/24/21 0254 07/25/21 0201  NA 135 130* 132*  K 3.7 3.3* 3.6  CL 92* 89* 92*  CO2 27 26 18*  GLUCOSE 145* 97 111*  BUN 59* 73* 87*  CREATININE 17.84* 20.21* 20.76*  CALCIUM 9.2 9.0 8.6*  PHOS  --  8.8*  --    GFR: Estimated Creatinine Clearance: 3.5 mL/min (A) (by C-G formula based on SCr of 20.76 mg/dL (H)). Liver Function Tests: Recent Labs  Lab 07/24/21 0254  AST 22  ALT 19  ALKPHOS 67  BILITOT 0.5  PROT 6.5  ALBUMIN 3.0*   2.8*   No results for input(s): LIPASE, AMYLASE in the last 168 hours. No results for input(s): AMMONIA in the last 168 hours. Coagulation Profile:  No results for input(s): INR, PROTIME in the last 168 hours. Cardiac Enzymes: No results for input(s): CKTOTAL, CKMB, CKMBINDEX, TROPONINI in the last 168 hours. BNP (last 3 results) No results for input(s): PROBNP in the last 8760 hours. HbA1C: No results for input(s): HGBA1C in the last 72 hours.  CBG: Recent Labs  Lab 07/24/21 1216 07/24/21 1606 07/24/21 1946 07/25/21 0810 07/25/21 1159  GLUCAP 126* 106* 103* 87 89   Lipid Profile: No results for input(s): CHOL, HDL, LDLCALC, TRIG, CHOLHDL, LDLDIRECT in the last 72 hours. Thyroid Function Tests: No results for input(s): TSH, T4TOTAL, FREET4, T3FREE, THYROIDAB in the last 72 hours. Anemia Panel: No results for input(s): VITAMINB12, FOLATE,  FERRITIN, TIBC, IRON, RETICCTPCT in the last 72 hours. Sepsis Labs: No results for input(s): PROCALCITON, LATICACIDVEN in the last 168 hours.  Recent Results (from the past 240 hour(s))  Resp Panel by RT-PCR (Flu A&B, Covid) Nasopharyngeal Swab     Status: Abnormal   Collection Time: 07/22/21  3:42 PM   Specimen: Nasopharyngeal Swab; Nasopharyngeal(NP) swabs in vial transport medium  Result Value Ref Range Status   SARS Coronavirus 2 by RT PCR POSITIVE (A) NEGATIVE Final    Comment: (NOTE) SARS-CoV-2 target nucleic acids are DETECTED.  The SARS-CoV-2 RNA is generally detectable in upper respiratory specimens during the acute phase of infection. Positive results are indicative of the presence of the identified virus, but do not rule out bacterial infection or co-infection with other pathogens not detected by the test. Clinical correlation with patient history and other diagnostic information is necessary to determine patient infection status. The expected result is Negative.  Fact Sheet for Patients: EntrepreneurPulse.com.au  Fact Sheet for Healthcare Providers: IncredibleEmployment.be  This test is not yet approved or cleared by the [place] FDA and  has been authorized for detection and/or diagnosis of SARS-CoV-2 by FDA under an Emergency Use Authorization (EUA).  This EUA will remain in effect (meaning this test can be used) for the duration of  the COVID-19 declaration under Section 564(b)(1) of the A ct, 21 U.S.C. section 360bbb-3(b)(1), unless the authorization is terminated or revoked sooner.     Influenza A by PCR NEGATIVE NEGATIVE Final   Influenza B by PCR NEGATIVE NEGATIVE Final    Comment: (NOTE) The Xpert Xpress SARS-CoV-2/FLU/RSV plus assay is intended as an aid in the diagnosis of influenza from Nasopharyngeal swab specimens and should not be used as a sole basis for treatment. Nasal washings and aspirates are  unacceptable for Xpert Xpress SARS-CoV-2/FLU/RSV testing.  Fact Sheet for Patients: EntrepreneurPulse.com.au  Fact Sheet for Healthcare Providers: IncredibleEmployment.be  This test is not yet approved or cleared by the [place] FDA and has been authorized for detection and/or diagnosis of SARS-CoV-2 by FDA under an Emergency Use Authorization (EUA). This EUA will remain in effect (meaning this test can be used) for the duration of the COVID-19 declaration under Section 564(b)(1) of the Act, 21 U.S.C. section 360bbb-3(b)(1), unless the authorization is terminated or revoked.  Performed at [facility], [address] [place], [place] [address]   Gastrointestinal Panel by PCR , Stool     Status: None   Collection Time: 07/23/21  6:51 AM   Specimen: Stool  Result Value Ref Range Status   Campylobacter species NOT DETECTED NOT DETECTED Final   Plesimonas shigelloides NOT DETECTED NOT DETECTED Final   Salmonella species NOT DETECTED NOT DETECTED Final   Yersinia enterocolitica NOT DETECTED NOT DETECTED Final   Vibrio species NOT DETECTED  NOT DETECTED Final   Vibrio cholerae NOT DETECTED NOT DETECTED Final   Enteroaggregative E coli (EAEC) NOT DETECTED NOT DETECTED Final   Enteropathogenic E coli (EPEC) NOT DETECTED NOT DETECTED Final   Enterotoxigenic E coli (ETEC) NOT DETECTED NOT DETECTED Final   Shiga like toxin producing E coli (STEC) NOT DETECTED NOT DETECTED Final   Shigella/Enteroinvasive E coli (EIEC) NOT DETECTED NOT DETECTED Final   Cryptosporidium NOT DETECTED NOT DETECTED Final   Cyclospora cayetanensis NOT DETECTED NOT DETECTED Final   Entamoeba histolytica NOT DETECTED NOT DETECTED Final   Giardia lamblia NOT DETECTED NOT DETECTED Final   Adenovirus F40/41 NOT DETECTED NOT DETECTED Final   Astrovirus NOT DETECTED NOT DETECTED Final   Norovirus GI/GII NOT DETECTED NOT DETECTED Final   Rotavirus A NOT DETECTED NOT DETECTED Final    Sapovirus (I, II, IV, and V) NOT DETECTED NOT DETECTED Final    Comment: Performed at [facility], [address] [place], [place] [address]  C Difficile Quick Screen w PCR reflex     Status: Abnormal   Collection Time: 07/23/21  6:52 AM   Specimen: STOOL  Result Value Ref Range Status   C Diff antigen POSITIVE (A) NEGATIVE Final   C Diff toxin NEGATIVE NEGATIVE Final   C Diff interpretation Results are indeterminate. See PCR results.  Final    Comment: Performed at [facility], [place] [address], [place], [place] [address]  C. Diff by PCR, Reflexed     Status: None   Collection Time: 07/23/21  6:52 AM  Result Value Ref Range Status   Toxigenic C. Difficile by PCR NEGATIVE NEGATIVE Final    Comment: Patient is colonized with non toxigenic C. difficile. May not need treatment unless significant symptoms are present. Performed at [facility], [place] [address] [place], [place] [address]          Radiology Studies: No results found.      Scheduled Meds:  [MAR Hold] aspirin EC  81 mg Oral Daily   [MAR Hold] calcitRIOL  0.25 mcg Oral Daily   [MAR Hold] Chlorhexidine Gluconate Cloth  6 each Topical Daily   [MAR Hold] fluticasone  1 spray Each Nare Daily   [MAR Hold] gentamicin cream  1 application Topical Daily   [MAR Hold] insulin aspart  0-5 Units Subcutaneous QHS   [MAR Hold] insulin aspart  0-9 Units Subcutaneous TID WC   [MAR Hold] insulin detemir  20 Units Subcutaneous QHS   [MAR Hold] losartan  50 mg Oral Daily   [MAR Hold] metoprolol succinate  25 mg Oral Daily   [MAR Hold] mometasone-formoterol  2 puff Inhalation BID   [MAR Hold] pantoprazole  40 mg Oral BID AC   sodium chloride flush  3 mL Intravenous Q12H   [MAR Hold] torsemide  100 mg Oral Daily   Continuous Infusions:  sodium chloride     sodium chloride 10 mL/hr at 07/25/21 0532   [MAR Hold] dialysis solution 2.5% low-MG/low-CA     heparin Stopped (07/25/21 1426)    Assessment &  Plan:   Principal Problem:   NSTEMI (non-ST elevated myocardial infarction) ([place]) Active Problems:   Hyperlipidemia   CAD (coronary artery disease)   Coronary artery disease involving native coronary artery of native heart without angina pectoris   End stage renal disease (HCC)   Chest pain   1.NSTEMI Cards following Heparing gtt, asa, beta blk, statin to be continued Echo nml EF 1/9 plan for  cardiac cath today  2.Hyperlipidemia LDL at goal, 53 1/9 continue statins  3. CAD  Continue beta-blockers, statin, aspirin  Cath today as above     4.ESRD Nephrology following PD continued here   5.IDDM with nephropathy A1 6.6 Continue riss  6.GERD Continue PPI  7. Covid infection No respiratory sx Continue to monitor   8.Obesity Class 2 Monitor weight, consider wt loss and increase activity as tolerated  9.h/xo copd-  without acute exacerbation Continue inhalers  10. Thrombocytopenia No s/sx of bleed Unclear etiology Is it covid related?  Monitor cbc  11.Diarrhea Likely from COVID GI panel negative C.  Difficile PCR negative Continue supportive care    DVT prophylaxis: heparin Code Status:Full Family Communication: none at bedside Disposition Plan:  Status is: Inpatient  Remains inpatient appropriate because: IV treatment. Needs LHC today            LOS: 3 days   Time spent: 35 min with >50% on coc    Nolberto Hanlon, MD [facility] Pager [phone]  If 7PM-7AM, please contact night-coverage 07/25/2021, 3:21 PM

## 2021-07-26 ENCOUNTER — Other Ambulatory Visit (HOSPITAL_COMMUNITY): Payer: Self-pay

## 2021-07-26 ENCOUNTER — Encounter (HOSPITAL_COMMUNITY): Payer: Self-pay | Admitting: Cardiovascular Disease

## 2021-07-26 DIAGNOSIS — D509 Iron deficiency anemia, unspecified: Secondary | ICD-10-CM | POA: Diagnosis not present

## 2021-07-26 DIAGNOSIS — N186 End stage renal disease: Secondary | ICD-10-CM | POA: Diagnosis not present

## 2021-07-26 DIAGNOSIS — D631 Anemia in chronic kidney disease: Secondary | ICD-10-CM | POA: Diagnosis not present

## 2021-07-26 DIAGNOSIS — Z955 Presence of coronary angioplasty implant and graft: Secondary | ICD-10-CM

## 2021-07-26 DIAGNOSIS — Z992 Dependence on renal dialysis: Secondary | ICD-10-CM | POA: Diagnosis not present

## 2021-07-26 DIAGNOSIS — N2581 Secondary hyperparathyroidism of renal origin: Secondary | ICD-10-CM | POA: Diagnosis not present

## 2021-07-26 DIAGNOSIS — I200 Unstable angina: Secondary | ICD-10-CM | POA: Diagnosis not present

## 2021-07-26 DIAGNOSIS — I214 Non-ST elevation (NSTEMI) myocardial infarction: Secondary | ICD-10-CM | POA: Diagnosis not present

## 2021-07-26 LAB — GLUCOSE, CAPILLARY
Glucose-Capillary: 89 mg/dL (ref 70–99)
Glucose-Capillary: 104 mg/dL — ABNORMAL HIGH (ref 70–99)
Glucose-Capillary: 126 mg/dL — ABNORMAL HIGH (ref 70–99)
Glucose-Capillary: 107 mg/dL — ABNORMAL HIGH (ref 70–99)
Glucose-Capillary: 161 mg/dL — ABNORMAL HIGH (ref 70–99)

## 2021-07-26 LAB — BASIC METABOLIC PANEL
Anion gap: 17 — ABNORMAL HIGH (ref 5–15)
BUN: 81 mg/dL — ABNORMAL HIGH (ref 8–23)
CO2: 23 mmol/L (ref 22–32)
Calcium: 8.8 mg/dL — ABNORMAL LOW (ref 8.9–10.3)
Chloride: 93 mmol/L — ABNORMAL LOW (ref 98–111)
Creatinine, Ser: 21.6 mg/dL — ABNORMAL HIGH (ref 0.61–1.24)
GFR, Estimated: 2 mL/min — ABNORMAL LOW (ref >60)
Glucose, Bld: 128 mg/dL — ABNORMAL HIGH (ref 70–99)
Potassium: 3.6 mmol/L (ref 3.5–5.1)
Sodium: 133 mmol/L — ABNORMAL LOW (ref 135–145)

## 2021-07-26 LAB — CBC
HCT: 27.6 % — ABNORMAL LOW (ref 39.0–52.0)
Hemoglobin: 9.2 g/dL — ABNORMAL LOW (ref 13.0–17.0)
MCH: 30 pg (ref 26.0–34.0)
MCHC: 33.3 g/dL (ref 30.0–36.0)
MCV: 89.9 fL (ref 80.0–100.0)
Platelets: 77 10*3/uL — ABNORMAL LOW (ref 150–400)
RBC: 3.07 MIL/uL — ABNORMAL LOW (ref 4.22–5.81)
RDW: 13.1 % (ref 11.5–15.5)
WBC: 4.1 10*3/uL (ref 4.0–10.5)
nRBC: 0 % (ref 0.0–0.2)

## 2021-07-26 MED ORDER — LOPERAMIDE HCL 1 MG/7.5ML PO SUSP
1.0000 mg | ORAL | Status: DC | PRN
  Administered 2021-07-26 – 2021-07-27 (×2): 1 mg via ORAL
  Filled 2021-07-26 (×3): qty 7.5

## 2021-07-26 MED FILL — Ondansetron HCl Inj 4 MG/2ML (2 MG/ML): INTRAMUSCULAR | Qty: 2 | Status: AC

## 2021-07-26 MED FILL — Verapamil HCl IV Soln 2.5 MG/ML: INTRAVENOUS | Qty: 2 | Status: AC

## 2021-07-26 NOTE — Progress Notes (Signed)
Will not ambulate due to covid restrictions. Called pt and discussed education by phone. Pt very receptive. Discussed MI, stent, Brilinta importance, restrictions, exercise, NTG, and CRPII. Will defer diet to RD as pt sees twice a month. Will refer to [facility].  [phone] Yves Dill CES, ACSM 11:03 AM 07/26/2021

## 2021-07-26 NOTE — Plan of Care (Signed)
°  Problem: Education: Goal: Knowledge of General Education information will improve Description: Including pain rating scale, medication(s)/side effects and non-pharmacologic comfort measures Outcome: Progressing   Problem: Clinical Measurements: Goal: Ability to maintain clinical measurements within normal limits will improve Outcome: Progressing   Problem: Clinical Measurements: Goal: Respiratory complications will improve Outcome: Progressing   Problem: Clinical Measurements: Goal: Cardiovascular complication will be avoided Outcome: Progressing   Problem: Pain Managment: Goal: General experience of comfort will improve Outcome: Progressing   Problem: Safety: Goal: Ability to remain free from injury will improve Outcome: Progressing   Problem: Education: Goal: Understanding of CV disease, CV risk reduction, and recovery process will improve Outcome: Progressing   Problem: Activity: Goal: Ability to return to baseline activity level will improve Outcome: Progressing

## 2021-07-26 NOTE — Progress Notes (Signed)
[facility] Progress Note  Name: NKOSI CORTRIGHT MRN: [id number] DOB: 29-Jan-1951  Subjective:  Remote rounding note: s/p LHC with complex RCA stenosis s/p PCI.  Still having diarrhea.      Intake/Output Summary (Last 24 hours) at 07/26/2021 1454 Last data filed at 07/26/2021 1400 Gross per 24 hour  Intake 2003 ml  Output 8011 ml  Net -6008 ml    Vitals:  Vitals:   07/26/21 0815 07/26/21 0819 07/26/21 0915 07/26/21 1206  BP: 124/64  122/78 107/74  Pulse: 77  84 91  Resp: (!) 22  20 20   Temp:    97.6 F (36.4 C)  TempSrc:    Oral  SpO2: 97% 100% 94% 99%  Weight:      Height:         Physical Exam: 07/25/21  GEN: NAD Neck supple trachea midline Lungs clear Cardiology RRR Abdomen soft nontender nondistended Extremities no edema lower extremities Psych normal mood and affect Neuro alert and oriented x 3 provides hx and follows commands Access: tenckhoff catheter in place bandaged; LUE AVF present with thrill     Medications reviewed   Labs:  BMP Latest Ref Rng & Units 07/26/2021 07/25/2021 07/24/2021  Glucose 70 - 99 mg/dL 128(H) 111(H) 97  BUN 8 - 23 mg/dL 81(H) 87(H) 73(H)  Creatinine 0.61 - 1.24 mg/dL 21.60(H) 20.76(H) 20.21(H)  BUN/Creat Ratio 6 - 22 (calc) - - -  Sodium 135 - 145 mmol/L 133(L) 132(L) 130(L)  Potassium 3.5 - 5.1 mmol/L 3.6 3.6 3.3(L)  Chloride 98 - 111 mmol/L 93(L) 92(L) 89(L)  CO2 22 - 32 mmol/L 23 18(L) 26  Calcium 8.9 - 10.3 mg/dL 8.8(L) 8.6(L) 9.0     Assessment/Plan:   # NSTEMI - per cardiology  - s/p left heart cath 07/25/21, complex RCA stenosis   # Covid positive - therapies per primary team    # ESRD on PD - continue PD here - all 2.5% today, adding exchange tonight given increased catabolic state with NSTEMI   # HTN - controlled. UF with PD as above  # Hypokalemia - potassium 20 meq once 07/25/21   # Anemia CKD - will need to request outpatient records. Hold ESA in setting of NSTEMI   # Secondary  hyperpara/metabolic bone disease - will need to request outpatient records. Start calcitriol 0.25 mcg daily.  Hyperphos - no binder on med list.  He can't remember what he takes and is going to call his wife to check when she's home  # thrombocytopenia - per primary team    Madelon Lips, MD 07/26/2021 2:54 PM

## 2021-07-26 NOTE — Progress Notes (Signed)
PROGRESS NOTE    BEECHER FURIO  YSA:[id number] DOB: May 17, 1951 DOA: 07/22/2021 PCP: [name], MD    Brief Narrative:   Sylvester Minton  is a 71 y.o. male, with past medical history of ESRD, on peritoneal dialysis, diabetes mellitus, hypertension, hyperlipidemia, CAD, with mild on cardiac cath 03/2013. -Patient presents to ED secondary to complaints of chest pain, reports in the center of his chest, started yesterday morning, has worsened in the evening with minimal activity as he was loading some wood into a wheelbarrow, reports chest pain is severe, 10 out of 10 intensity, accompanied by some nausea, and vomiting, and some dyspnea as well, he called cardiology office who recommended him to come to ED, chest pain 5 out of 10 on presentation, but he is currently chest pain-free,. -In ED work-up significant for EKG with sinus rhythm, Mobitz 1 second-degree AV block, blood pressure 109/71 on presentation, saturating 93% on room air, initial troponin significantly elevated at 3428, creatinine of 18, sodium of 135, hemoglobin of 10, platelet of 100 K, chest x-ray with no acute findings, patient was seen by cardiology where he started on aspirin, full dose heparin for NSTEMI, and recommendation has been made to transfer to [facility] for [facility].     1/10 underwent LHC with pci on 1/9. Platelet dropping. Still with 6x /day diarrhea.    Consultants:  Nephrology, cardiology  Procedures:PD  Antimicrobials:     Subjective: No sob, cp, dizziness  Objective: Vitals:   07/26/21 0815 07/26/21 0819 07/26/21 0915 07/26/21 1206  BP: 124/64  122/78 107/74  Pulse: 77  84 91  Resp: (!) 22  20 20   Temp:    97.6 F (36.4 C)  TempSrc:    Oral  SpO2: 97% 100% 94% 99%  Weight:      Height:        Intake/Output Summary (Last 24 hours) at 07/26/2021 1432 Last data filed at 07/26/2021 1400 Gross per 24 hour  Intake 2003 ml  Output 8011 ml  Net -6008 ml   Filed Weights   07/25/21 0642 07/25/21 1853  07/26/21 0700  Weight: 100 kg 101.6 kg 98.4 kg    Examination: NAD, calm CTA no wheezing Regular S1-S2 no gallops Soft benign positive bowel sounds Positive edema Awake and oriented x3   Data Reviewed: I have personally reviewed following labs and imaging studies  CBC: Recent Labs  Lab 07/22/21 1416 07/23/21 0532 07/24/21 0254 07/25/21 0201 07/26/21 0555  WBC 5.0 4.3 3.5* 4.5 4.1  HGB 10.2* 9.5* 10.1* 9.5* 9.2*  HCT 31.1* 28.9* 30.9* 29.7* 27.6*  MCV 94.0 92.9 91.7 91.4 89.9  PLT 100* 83* 80* 82* 77*   Basic Metabolic Panel: Recent Labs  Lab 07/22/21 1416 07/24/21 0254 07/25/21 0201 07/26/21 0555  NA 135 130* 132* 133*  K 3.7 3.3* 3.6 3.6  CL 92* 89* 92* 93*  CO2 27 26 18* 23  GLUCOSE 145* 97 111* 128*  BUN 59* 73* 87* 81*  CREATININE 17.84* 20.21* 20.76* 21.60*  CALCIUM 9.2 9.0 8.6* 8.8*  PHOS  --  8.8*  --   --    GFR: Estimated Creatinine Clearance: 3.3 mL/min (A) (by C-G formula based on SCr of 21.6 mg/dL (H)). Liver Function Tests: Recent Labs  Lab 07/24/21 0254  AST 22  ALT 19  ALKPHOS 67  BILITOT 0.5  PROT 6.5  ALBUMIN 3.0*   2.8*   No results for input(s): LIPASE, AMYLASE in the last 168 hours. No results for input(s):  AMMONIA in the last 168 hours. Coagulation Profile: No results for input(s): INR, PROTIME in the last 168 hours. Cardiac Enzymes: No results for input(s): CKTOTAL, CKMB, CKMBINDEX, TROPONINI in the last 168 hours. BNP (last 3 results) No results for input(s): PROBNP in the last 8760 hours. HbA1C: No results for input(s): HGBA1C in the last 72 hours.  CBG: Recent Labs  Lab 07/25/21 1159 07/25/21 1718 07/25/21 2203 07/26/21 0832 07/26/21 1123  GLUCAP 89 89 145* 104* 126*   Lipid Profile: No results for input(s): CHOL, HDL, LDLCALC, TRIG, CHOLHDL, LDLDIRECT in the last 72 hours. Thyroid Function Tests: No results for input(s): TSH, T4TOTAL, FREET4, T3FREE, THYROIDAB in the last 72 hours. Anemia Panel: No results  for input(s): VITAMINB12, FOLATE, FERRITIN, TIBC, IRON, RETICCTPCT in the last 72 hours. Sepsis Labs: No results for input(s): PROCALCITON, LATICACIDVEN in the last 168 hours.  Recent Results (from the past 240 hour(s))  Resp Panel by RT-PCR (Flu A&B, Covid) Nasopharyngeal Swab     Status: Abnormal   Collection Time: 07/22/21  3:42 PM   Specimen: Nasopharyngeal Swab; Nasopharyngeal(NP) swabs in vial transport medium  Result Value Ref Range Status   SARS Coronavirus 2 by RT PCR POSITIVE (A) NEGATIVE Final    Comment: (NOTE) SARS-CoV-2 target nucleic acids are DETECTED.  The SARS-CoV-2 RNA is generally detectable in upper respiratory specimens during the acute phase of infection. Positive results are indicative of the presence of the identified virus, but do not rule out bacterial infection or co-infection with other pathogens not detected by the test. Clinical correlation with patient history and other diagnostic information is necessary to determine patient infection status. The expected result is Negative.  Fact Sheet for Patients: EntrepreneurPulse.com.au  Fact Sheet for Healthcare Providers: IncredibleEmployment.be  This test is not yet approved or cleared by the [place] FDA and  has been authorized for detection and/or diagnosis of SARS-CoV-2 by FDA under an Emergency Use Authorization (EUA).  This EUA will remain in effect (meaning this test can be used) for the duration of  the COVID-19 declaration under Section 564(b)(1) of the A ct, 21 U.S.C. section 360bbb-3(b)(1), unless the authorization is terminated or revoked sooner.     Influenza A by PCR NEGATIVE NEGATIVE Final   Influenza B by PCR NEGATIVE NEGATIVE Final    Comment: (NOTE) The Xpert Xpress SARS-CoV-2/FLU/RSV plus assay is intended as an aid in the diagnosis of influenza from Nasopharyngeal swab specimens and should not be used as a sole basis for treatment. Nasal  washings and aspirates are unacceptable for Xpert Xpress SARS-CoV-2/FLU/RSV testing.  Fact Sheet for Patients: EntrepreneurPulse.com.au  Fact Sheet for Healthcare Providers: IncredibleEmployment.be  This test is not yet approved or cleared by the [place] FDA and has been authorized for detection and/or diagnosis of SARS-CoV-2 by FDA under an Emergency Use Authorization (EUA). This EUA will remain in effect (meaning this test can be used) for the duration of the COVID-19 declaration under Section 564(b)(1) of the Act, 21 U.S.C. section 360bbb-3(b)(1), unless the authorization is terminated or revoked.  Performed at [facility], [address], [place], [place] [address]   Gastrointestinal Panel by PCR , Stool     Status: None   Collection Time: 07/23/21  6:51 AM   Specimen: Stool  Result Value Ref Range Status   Campylobacter species NOT DETECTED NOT DETECTED Final   Plesimonas shigelloides NOT DETECTED NOT DETECTED Final   Salmonella species NOT DETECTED NOT DETECTED Final   Yersinia enterocolitica NOT DETECTED NOT  DETECTED Final   Vibrio species NOT DETECTED NOT DETECTED Final   Vibrio cholerae NOT DETECTED NOT DETECTED Final   Enteroaggregative E coli (EAEC) NOT DETECTED NOT DETECTED Final   Enteropathogenic E coli (EPEC) NOT DETECTED NOT DETECTED Final   Enterotoxigenic E coli (ETEC) NOT DETECTED NOT DETECTED Final   Shiga like toxin producing E coli (STEC) NOT DETECTED NOT DETECTED Final   Shigella/Enteroinvasive E coli (EIEC) NOT DETECTED NOT DETECTED Final   Cryptosporidium NOT DETECTED NOT DETECTED Final   Cyclospora cayetanensis NOT DETECTED NOT DETECTED Final   Entamoeba histolytica NOT DETECTED NOT DETECTED Final   Giardia lamblia NOT DETECTED NOT DETECTED Final   Adenovirus F40/41 NOT DETECTED NOT DETECTED Final   Astrovirus NOT DETECTED NOT DETECTED Final   Norovirus GI/GII NOT DETECTED NOT DETECTED Final   Rotavirus A NOT  DETECTED NOT DETECTED Final   Sapovirus (I, II, IV, and V) NOT DETECTED NOT DETECTED Final    Comment: Performed at [facility], [address] [place], [place] [address]  C Difficile Quick Screen w PCR reflex     Status: Abnormal   Collection Time: 07/23/21  6:52 AM   Specimen: STOOL  Result Value Ref Range Status   C Diff antigen POSITIVE (A) NEGATIVE Final   C Diff toxin NEGATIVE NEGATIVE Final   C Diff interpretation Results are indeterminate. See PCR results.  Final    Comment: Performed at [facility], [place] [address], [place], [place] [address]  C. Diff by PCR, Reflexed     Status: None   Collection Time: 07/23/21  6:52 AM  Result Value Ref Range Status   Toxigenic C. Difficile by PCR NEGATIVE NEGATIVE Final    Comment: Patient is colonized with non toxigenic C. difficile. May not need treatment unless significant symptoms are present. Performed at [facility], [place] [address], [place], [place] [address]          Radiology Studies: CARDIAC CATHETERIZATION  Result Date: 07/26/2021   Mid RCA-1 lesion is 80% stenosed.   Mid RCA-2 lesion is 75% stenosed.   Prox RCA lesion is 80% stenosed.   Prox LAD lesion is 50% stenosed.   1st Mrg lesion is 50% stenosed.   A drug-eluting stent was successfully placed using a STENT ONYX FRONTIER 3.5X15.   Balloon angioplasty was performed using a BALLN SAPPHIRE 3.0X15.   Balloon angioplasty was performed using a BALLN SAPPHIRE 3.0X15.   Post intervention, there is a 30% residual stenosis.   Post intervention, there is a 30% residual stenosis.   Post intervention, there is a 10% residual stenosis. 1.  Severe complex RCA stenosis with severe disease in the proximal, mid, and distal vessel, treated successfully with PTCA and stenting of the proximal lesion, and PTCA alone of the mid and distal lesions.  A 3.5 x 15 mm Onyx frontier DES is used to treat the proximal lesion.  The mid and distal RCA lesions had appearance of calcific  and thrombotic disease, both improved with PTCA. 2.  Mild to moderate nonobstructive calcific plaquing in the proximal LAD with no severe stenoses throughout the LAD distribution. 3.  Mild to moderate calcific stenosis of the first OM branch of the circumflex, with no high-grade stenoses throughout the circumflex distribution. Recommendations: Dual antiplatelet therapy with aspirin and ticagrelor x12 months as tolerated.  Post MI medical therapy.        Scheduled Meds:  aspirin EC  81 mg Oral Daily   calcitRIOL  0.25 mcg Oral Daily   Chlorhexidine Gluconate Cloth  6 each Topical Daily   fluticasone  1 spray Each Nare Daily   gentamicin cream  1 application Topical Daily   heparin  5,000 Units Subcutaneous Q8H   insulin aspart  0-5 Units Subcutaneous QHS   insulin aspart  0-9 Units Subcutaneous TID WC   insulin detemir  20 Units Subcutaneous QHS   losartan  50 mg Oral Daily   metoprolol succinate  25 mg Oral Daily   mometasone-formoterol  2 puff Inhalation BID   pantoprazole  40 mg Oral BID AC   sodium chloride flush  3 mL Intravenous Q12H   ticagrelor  90 mg Oral BID   torsemide  100 mg Oral Daily   Continuous Infusions:  sodium chloride     dialysis solution 2.5% low-MG/low-CA      Assessment & Plan:   Principal Problem:   NSTEMI (non-ST elevated myocardial infarction) ([place]) Active Problems:   Hyperlipidemia   CAD (coronary artery disease)   Coronary artery disease involving native coronary artery of native heart without angina pectoris   End stage renal disease (HCC)   Chest pain   1.NSTEMI Cards following Echo nml EF 1/10 s/p LHC -severe RCA stenosis status post successful DES Continue aspirin, losartan, metoprolol, Brilinta Was on statin at home   2.Hyperlipidemia LDL at goal, 53 1/10 was on statin at home , was d/c'd here? Unsure why. If ok by nephrology, would continue.   3. Diarrhea 2/2 covid Having 6x/day GI panel negative  C. difficile PCR negative   Will add Imodium as needed   4.Thrombocytopenia No s/sx of bleed Unclear etiology Is it covid related?  1/10 Plt continues to drop. Will monitor to see if stabilizing before d/c'ing him home      4.ESRD Nephrology following PD continued here   5.IDDM with nephropathy A1 6.6 Continue riss  6.GERD Continue PPI  7. Covid infection No respiratory sx Continue to monitor   8.Obesity Class 2 Monitor weight, consider wt loss and increase activity as tolerated  9.h/xo copd-  without acute exacerbation Continue inhalers  10.CAD  Continue beta-blockers, statin, aspirin       DVT prophylaxis: heparin Code Status:Full Family Communication: none at bedside Disposition Plan:  Status is: Inpatient  Remains inpatient appropriate because: IV treatment. . Platelets are dropping needs to monitor to make sure they stabilize prior to discharge.  Patient is also having 6-7 bouts of diarrhea per day started on Imodium need to slow this down prior to discharge.            LOS: 4 days   Time spent: 35 min with >50% on coc    Nolberto Hanlon, MD [facility] Pager [phone]  If 7PM-7AM, please contact night-coverage 07/26/2021, 2:32 PM

## 2021-07-26 NOTE — TOC Benefit Eligibility Note (Signed)
Patient Teacher, English as a foreign language completed.    The patient is currently admitted and upon discharge could be taking Brilinta 90 mg.  The current 30 day co-pay is, $10.35.   The patient is insured through [place], [employer] Patient Advocate Specialist [place] Patient Advocate Team Direct Number: [phone]  Fax: [phone]

## 2021-07-26 NOTE — Progress Notes (Signed)
Pt's heart rate dropping into 50s currently 82, BP 125/72. Paged cardiology, Isaac Laud, [place], to advise if okay with losartan and metoprolol being given before cath? Advised to hold losartan and administer metoprolol.

## 2021-07-26 NOTE — Care Management Important Message (Signed)
Important Message  Patient Details  Name: [name] [name] MRN: [id number] Date of Birth: [birth year]   Medicare Important Message Given:  Yes     [name] 07/26/2021, 8:26 AM

## 2021-07-26 NOTE — Progress Notes (Addendum)
DAILY PROGRESS NOTE   Patient Name: [name] [name] Date of Encounter: 07/26/2021 Cardiologist: Pixie Casino, MD  Chief Complaint   No further chest pain  Patient Profile   [name] [name] is a 71 y.o. male with a hx of CAD (mild on cath 03/2013), IDDM, hyperlipidemia, hypertension, morbid obesity, OSA, ESRD on PD who is being seen 07/24/2021 for the evaluation of NSTEMI at the request of Dr Roderic Palau.  Subjective   Chest burning has resolved. Successful PCI to the RCA yesterday by Dr. Burt Knack. He has mild to moderate residual disease of the LAD and LCx. Will manage medically.  Objective   Vitals:   07/26/21 0700 07/26/21 0815 07/26/21 0819 07/26/21 0915  BP: (!) 135/97 124/64  122/78  Pulse: 84 77  84  Resp: 16 (!) 22  20  Temp: (!) 97.2 F (36.2 C)     TempSrc: Tympanic     SpO2: 99% 97% 100% 94%  Weight: 98.4 kg     Height:        Intake/Output Summary (Last 24 hours) at 07/26/2021 1025 Last data filed at 07/26/2021 0900 Gross per 24 hour  Intake 1763 ml  Output 8011 ml  Net -6248 ml   Filed Weights   07/25/21 0642 07/25/21 1853 07/26/21 0700  Weight: 100 kg 101.6 kg 98.4 kg    Physical Exam   General appearance: alert, no distress, and morbidly obese Lungs: clear to auscultation bilaterally Heart: regular rate and rhythm Extremities: extremities normal, atraumatic, no cyanosis or edema Neurologic: Grossly normal  Inpatient Medications    Scheduled Meds:  aspirin EC  81 mg Oral Daily   calcitRIOL  0.25 mcg Oral Daily   Chlorhexidine Gluconate Cloth  6 each Topical Daily   fluticasone  1 spray Each Nare Daily   gentamicin cream  1 application Topical Daily   heparin  5,000 Units Subcutaneous Q8H   insulin aspart  0-5 Units Subcutaneous QHS   insulin aspart  0-9 Units Subcutaneous TID WC   insulin detemir  20 Units Subcutaneous QHS   losartan  50 mg Oral Daily   metoprolol succinate  25 mg Oral Daily   mometasone-formoterol  2 puff Inhalation BID    pantoprazole  40 mg Oral BID AC   sodium chloride flush  3 mL Intravenous Q12H   ticagrelor  90 mg Oral BID   torsemide  100 mg Oral Daily    Continuous Infusions:  sodium chloride     dialysis solution 2.5% low-MG/low-CA      PRN Meds: sodium chloride, acetaminophen, alum & mag hydroxide-simeth, fentaNYL (SUBLIMAZE) injection, dianeal solution for CAPD/CCPD with heparin, Ipratropium-Albuterol, nitroGLYCERIN, ondansetron (ZOFRAN) IV, sodium chloride flush   Labs   Results for orders placed or performed during the hospital encounter of 07/22/21 (from the past 48 hour(s))  Glucose, capillary     Status: Abnormal   Collection Time: 07/24/21 12:16 PM  Result Value Ref Range   Glucose-Capillary 126 (H) 70 - 99 mg/dL    Comment: Glucose reference range applies only to samples taken after fasting for at least 8 hours.  Glucose, capillary     Status: Abnormal   Collection Time: 07/24/21  4:06 PM  Result Value Ref Range   Glucose-Capillary 106 (H) 70 - 99 mg/dL    Comment: Glucose reference range applies only to samples taken after fasting for at least 8 hours.  Glucose, capillary     Status: Abnormal   Collection Time: 07/24/21  7:46  PM  Result Value Ref Range   Glucose-Capillary 103 (H) 70 - 99 mg/dL    Comment: Glucose reference range applies only to samples taken after fasting for at least 8 hours.  CBC     Status: Abnormal   Collection Time: 07/25/21  2:01 AM  Result Value Ref Range   WBC 4.5 4.0 - 10.5 K/uL   RBC 3.25 (L) 4.22 - 5.81 MIL/uL   Hemoglobin 9.5 (L) 13.0 - 17.0 g/dL   HCT 29.7 (L) 39.0 - 52.0 %   MCV 91.4 80.0 - 100.0 fL   MCH 29.2 26.0 - 34.0 pg   MCHC 32.0 30.0 - 36.0 g/dL   RDW 13.0 11.5 - 15.5 %   Platelets 82 (L) 150 - 400 K/uL    Comment: Immature Platelet Fraction may be clinically indicated, consider ordering this additional test [id number] CONSISTENT WITH PREVIOUS RESULT REPEATED TO VERIFY    nRBC 0.0 0.0 - 0.2 %    Comment: Performed at [facility], [place] [address], [place], [place] [address]  Heparin level (unfractionated)     Status: None   Collection Time: 07/25/21  2:01 AM  Result Value Ref Range   Heparin Unfractionated 0.59 0.30 - 0.70 IU/mL    Comment: (NOTE) The clinical reportable range upper limit is being lowered to >1.10 to align with the FDA approved guidance for the current laboratory assay.  If heparin results are below expected values, and patient dosage has  been confirmed, suggest follow up testing of antithrombin III levels. Performed at [facility], [place] [address] [place], [place] [address]   Basic metabolic panel     Status: Abnormal   Collection Time: 07/25/21  2:01 AM  Result Value Ref Range   Sodium 132 (L) 135 - 145 mmol/L   Potassium 3.6 3.5 - 5.1 mmol/L   Chloride 92 (L) 98 - 111 mmol/L   CO2 18 (L) 22 - 32 mmol/L   Glucose, Bld 111 (H) 70 - 99 mg/dL    Comment: Glucose reference range applies only to samples taken after fasting for at least 8 hours.   BUN 87 (H) 8 - 23 mg/dL   Creatinine, Ser 20.76 (H) 0.61 - 1.24 mg/dL   Calcium 8.6 (L) 8.9 - 10.3 mg/dL   GFR, Estimated NOT CALCULATED >60 mL/min    Comment: (NOTE) Calculated using the CKD-EPI Creatinine Equation (2021)    Anion gap 22 (H) 5 - 15    Comment: Electrolytes repeated to confirm. Performed at [facility], [place] [address] [place], [place] [address]   Glucose, capillary     Status: None   Collection Time: 07/25/21  8:10 AM  Result Value Ref Range   Glucose-Capillary 87 70 - 99 mg/dL    Comment: Glucose reference range applies only to samples taken after fasting for at least 8 hours.  Glucose, capillary     Status: None   Collection Time: 07/25/21 11:59 AM  Result Value Ref Range   Glucose-Capillary 89 70 - 99 mg/dL    Comment: Glucose reference range applies only to samples taken after fasting for at least 8 hours.  POCT Activated clotting time     Status: None   Collection Time: 07/25/21  3:53 PM  Result  Value Ref Range   Activated Clotting Time 317 seconds    Comment: Reference range 74-137 seconds for patients not on anticoagulant therapy.  POCT Activated clotting time     Status: None  Collection Time: 07/25/21  4:31 PM  Result Value Ref Range   Activated Clotting Time 293 seconds    Comment: Reference range 74-137 seconds for patients not on anticoagulant therapy.  Glucose, capillary     Status: None   Collection Time: 07/25/21  5:18 PM  Result Value Ref Range   Glucose-Capillary 89 70 - 99 mg/dL    Comment: Glucose reference range applies only to samples taken after fasting for at least 8 hours.  Glucose, capillary     Status: Abnormal   Collection Time: 07/25/21 10:03 PM  Result Value Ref Range   Glucose-Capillary 145 (H) 70 - 99 mg/dL    Comment: Glucose reference range applies only to samples taken after fasting for at least 8 hours.  Basic metabolic panel     Status: Abnormal   Collection Time: 07/26/21  5:55 AM  Result Value Ref Range   Sodium 133 (L) 135 - 145 mmol/L   Potassium 3.6 3.5 - 5.1 mmol/L   Chloride 93 (L) 98 - 111 mmol/L   CO2 23 22 - 32 mmol/L   Glucose, Bld 128 (H) 70 - 99 mg/dL    Comment: Glucose reference range applies only to samples taken after fasting for at least 8 hours.   BUN 81 (H) 8 - 23 mg/dL   Creatinine, Ser 21.60 (H) 0.61 - 1.24 mg/dL   Calcium 8.8 (L) 8.9 - 10.3 mg/dL   GFR, Estimated 2 (L) >60 mL/min    Comment: (NOTE) Calculated using the CKD-EPI Creatinine Equation (2021)    Anion gap 17 (H) 5 - 15    Comment: Performed at [facility] [address], [place], [place] [address]  CBC     Status: Abnormal   Collection Time: 07/26/21  5:55 AM  Result Value Ref Range   WBC 4.1 4.0 - 10.5 K/uL   RBC 3.07 (L) 4.22 - 5.81 MIL/uL   Hemoglobin 9.2 (L) 13.0 - 17.0 g/dL   HCT 27.6 (L) 39.0 - 52.0 %   MCV 89.9 80.0 - 100.0 fL   MCH 30.0 26.0 - 34.0 pg   MCHC 33.3 30.0 - 36.0 g/dL   RDW 13.1 11.5 - 15.5 %   Platelets 77 (L) 150 -  400 K/uL    Comment: Immature Platelet Fraction may be clinically indicated, consider ordering this additional test [id number] CONSISTENT WITH PREVIOUS RESULT REPEATED TO VERIFY    nRBC 0.0 0.0 - 0.2 %    Comment: Performed at [facility], [place] [address], [place], [place] [address]  Glucose, capillary     Status: Abnormal   Collection Time: 07/26/21  8:32 AM  Result Value Ref Range   Glucose-Capillary 104 (H) 70 - 99 mg/dL    Comment: Glucose reference range applies only to samples taken after fasting for at least 8 hours.    ECG   N/A  Telemetry   Sinus rhythm with LBBB - Personally Reviewed  Radiology    CARDIAC CATHETERIZATION  Result Date: 07/26/2021   Mid RCA-1 lesion is 80% stenosed.   Mid RCA-2 lesion is 75% stenosed.   Prox RCA lesion is 80% stenosed.   Prox LAD lesion is 50% stenosed.   1st Mrg lesion is 50% stenosed.   A drug-eluting stent was successfully placed using a STENT ONYX FRONTIER 3.5X15.   Balloon angioplasty was performed using a BALLN SAPPHIRE 3.0X15.   Balloon angioplasty was performed using a BALLN SAPPHIRE 3.0X15.   Post intervention, there is  a 30% residual stenosis.   Post intervention, there is a 30% residual stenosis.   Post intervention, there is a 10% residual stenosis. 1.  Severe complex RCA stenosis with severe disease in the proximal, mid, and distal vessel, treated successfully with PTCA and stenting of the proximal lesion, and PTCA alone of the mid and distal lesions.  A 3.5 x 15 mm Onyx frontier DES is used to treat the proximal lesion.  The mid and distal RCA lesions had appearance of calcific and thrombotic disease, both improved with PTCA. 2.  Mild to moderate nonobstructive calcific plaquing in the proximal LAD with no severe stenoses throughout the LAD distribution. 3.  Mild to moderate calcific stenosis of the first OM branch of the circumflex, with no high-grade stenoses throughout the circumflex distribution. Recommendations: Dual  antiplatelet therapy with aspirin and ticagrelor x12 months as tolerated.  Post MI medical therapy.    Cardiac Studies   Patient Name:   [name] [name] Date of Exam: 07/23/2021  Medical Rec #:  [id number]      Height:       64.0 in  Accession #:    [id number]     Weight:       230.0 lb  Date of Birth:  [birth year]     BSA:          2.076 m  Patient Age:    64 years       BP:           122/61 mmHg  Patient Gender: M              HR:           83 bpm.  Exam Location:  [facility]   Procedure: 2D Echo, Cardiac Doppler and Color Doppler   Indications:    Chest Pain     History:        Patient has prior history of Echocardiogram examinations,  most                  recent 08/29/2018. CAD and Previous Myocardial Infarction,                  Signs/Symptoms:Chest Pain; Risk Factors:Hypertension,  Diabetes,                  Dyslipidemia and Former Smoker. COVID +.     Sonographer:    Wenda Low  Referring Phys: [place]     1. Left ventricular ejection fraction, by estimation, is 55 to 60%. The  left ventricle has normal function. The left ventricle has no regional  wall motion abnormalities. There is severe left ventricular hypertrophy.  Left ventricular diastolic parameters   are consistent with Grade I diastolic dysfunction (impaired relaxation).  Elevated left atrial pressure.   2. Right ventricular systolic function is normal. The right ventricular  size is normal. There is normal pulmonary artery systolic pressure.   3. Left atrial size was mildly dilated.   4. Mild mitral valve regurgitation.   5. AV is thickened with minimally restricted motion. Peak and mean  gradients through the valve aer 19 and 10 mm Hg respectively. . The aortic  valve is tricuspid. Aortic valve regurgitation is trivial. Aortic valve  sclerosis is present, with no evidence  of aortic valve stenosis.  Assessment   Principal Problem:   NSTEMI (non-ST elevated myocardial  infarction) [facility]) Active Problems:   Hyperlipidemia   CAD (coronary artery  disease)   Coronary artery disease involving native coronary artery of native heart without angina pectoris   End stage renal disease (HCC)   Chest pain   Plan   NSTEMI Mr. Mabey presents with chest pain and NSTEMI with troponin to the 3000's now downtrending.  He has end-stage renal disease on home peritoneal dialysis.  He has a history of some mild coronary disease by cath in 2014 but is done well on medical therapy.  Based on these findings he will need a cardiac catheterization which is scheduled today.  He is incidentally found to be COVID-positive however does not appear to have a pneumonia or active respiratory symptoms.  This however may have contributed to his MI.  Echo was reassuring with normal systolic function and no regional wall motion abnormalities. LHC showed severe RCA stenosis - s/p successful DES Continue aspirin, heparin, losartan and metoprolol. Continue torsemide as per nephrology Will need minimum of 12 months of Brilinta as well Not on statin d/t paucity of evidence in ESRD patients ESRD on PD Management per nephrology, on losartan and torsemide COVID+ Supportive care, not currently on treatment for COVID-19.  Could be discharged today from a cardiology standpoint. We will arrange follow-up with cardiology APP and then me in the office.  Time Spent Directly with Patient:  I have spent a total of 25 minutes with the patient reviewing hospital notes, telemetry, EKGs, labs and examining the patient as well as establishing an assessment and plan that was discussed personally with the patient.  > 50% of time was spent in direct patient care.   Length of Stay:  LOS: 4 days   Pixie Casino, MD, [facility], [place] Director of the [facility]  [facility] Diplomate of the American Board of Clinical  Lipidology Attending Cardiologist  Direct Dial: [phone]   Fax: [phone]  Website:  [email or web address] 07/26/2021, 10:25 AM

## 2021-07-26 NOTE — Progress Notes (Signed)
Per verbal from Nephrology, okay to hold daily dose of torsemide until tomorrow.

## 2021-07-27 DIAGNOSIS — E782 Mixed hyperlipidemia: Secondary | ICD-10-CM | POA: Diagnosis not present

## 2021-07-27 DIAGNOSIS — I200 Unstable angina: Secondary | ICD-10-CM

## 2021-07-27 DIAGNOSIS — I214 Non-ST elevation (NSTEMI) myocardial infarction: Secondary | ICD-10-CM | POA: Diagnosis not present

## 2021-07-27 DIAGNOSIS — N186 End stage renal disease: Secondary | ICD-10-CM | POA: Diagnosis not present

## 2021-07-27 LAB — CBC
HCT: 26.3 % — ABNORMAL LOW (ref 39.0–52.0)
Hemoglobin: 8.6 g/dL — ABNORMAL LOW (ref 13.0–17.0)
MCH: 29.4 pg (ref 26.0–34.0)
MCHC: 32.7 g/dL (ref 30.0–36.0)
MCV: 89.8 fL (ref 80.0–100.0)
Platelets: 83 K/uL — ABNORMAL LOW (ref 150–400)
RBC: 2.93 MIL/uL — ABNORMAL LOW (ref 4.22–5.81)
RDW: 13.2 % (ref 11.5–15.5)
WBC: 4.7 K/uL (ref 4.0–10.5)
nRBC: 0 % (ref 0.0–0.2)

## 2021-07-27 LAB — GLUCOSE, CAPILLARY: Glucose-Capillary: 118 mg/dL — ABNORMAL HIGH (ref 70–99)

## 2021-07-27 MED ORDER — LOPERAMIDE HCL 1 MG/7.5ML PO SUSP: 1.0000 mg | ORAL | 0 refills | Status: DC | PRN

## 2021-07-27 MED ORDER — TICAGRELOR 90 MG PO TABS: 90.0000 mg | ORAL_TABLET | Freq: Two times a day (BID) | ORAL | 3 refills | Status: DC

## 2021-07-27 NOTE — Discharge Summary (Signed)
Physician Discharge Summary  [name] [name] JJO:[id number] DOB: 1951-07-07 DOA: 07/22/2021  PCP: [name], MD  Admit date: 07/22/2021 Discharge date: 07/27/2021  Admitted From: Home.  Disposition:  Home.   Recommendations for Outpatient Follow-up:  Follow up with PCP in 1-2 weeks Please obtain BMP/CBC in one week Please follow up with cardiology and nephrology as recommended.     Discharge Condition: guarded.  CODE STATUS:full code.  Diet recommendation: Heart Healthy   Brief/Interim Summary:  [name] [name]  is a 71 y.o. male, with past medical history of ESRD, on peritoneal dialysis, diabetes mellitus, hypertension, hyperlipidemia, CAD, with mild on cardiac cath 03/2013. -Patient presents to ED secondary to complaints of chest pain, reports in the center of his chest, started yesterday morning, has worsened in the evening with minimal activity as he was loading some wood into a wheelbarrow, reports chest pain is severe, 10 out of 10 intensity, accompanied by some nausea, and vomiting, and some dyspnea as well, he called cardiology office who recommended him to come to ED, chest pain 5 out of 10 on presentation, but he is currently chest pain-free,. -In ED work-up significant for EKG with sinus rhythm, Mobitz 1 second-degree AV block, blood pressure 109/71 on presentation, saturating 93% on room air, initial troponin significantly elevated at 3428, creatinine of 18, sodium of 135, hemoglobin of 10, platelet of 100 K, chest x-ray with no acute findings, patient was seen by cardiology where he started on aspirin, full dose heparin for NSTEMI, and recommendation has been made to transfer to [facility] for [facility]. Discharge Diagnoses:  Principal Problem:   NSTEMI (non-ST elevated myocardial infarction) [facility]) Active Problems:   Hyperlipidemia   CAD (coronary artery disease)   Coronary artery disease involving native coronary artery of native heart without angina pectoris   End stage renal  disease (HCC)   Chest pain   1.NSTEMI Cards following Echo nml EF 1/10 s/p LHC -severe RCA stenosis status post successful DES Continue aspirin, losartan, metoprolol, Brilinta Recommend outpatient follow up with cardiology.      2.Hyperlipidemia Resume statin.      3. Diarrhea only 1 episode since monring.  2/2 covid GI panel negative  C. difficile PCR negative  Will add Imodium as needed      4.Thrombocytopenia No s/sx of bleed Unclear etiology, possibly covid related? Platelets improved with am labs, no signs of bleeding. Pt requested to go home and follow up with PCP, .        4.ESRD Nephrology recommendations appreciated.    5.IDDM with nephropathy A1 6.6 Continue riss   6.GERD Continue PPI   7. Covid infection No respiratory sx Continue to monitor    8.Obesity Class 2 Monitor weight, consider wt loss and increase activity as tolerated   9.h/xo copd-  without acute exacerbation Continue inhalers   10.CAD  Continue beta-blockers, statin, aspirin            Discharge Instructions  Discharge Instructions     Amb Referral to Cardiac Rehabilitation   Complete by: As directed    Diagnosis:  Coronary Stents NSTEMI PTCA     After initial evaluation and assessments completed: Virtual Based Care may be provided alone or in conjunction with Phase 2 Cardiac Rehab based on patient barriers.: Yes   Diet - low sodium heart healthy   Complete by: As directed    Discharge instructions   Complete by: As directed    Please follow up with cardiology and nephrology as recommended.  Increase activity slowly   Complete by: As directed    No wound care   Complete by: As directed       Allergies as of 07/27/2021       Reactions   Opana [oxymorphone Hcl] Itching   Oxymorphone Itching   Tramadol Itching        Medication List     STOP taking these medications    amLODipine 5 MG tablet Commonly known as: NORVASC   diclofenac 50 MG  tablet Commonly known as: CATAFLAM   fluticasone 50 MCG/ACT nasal spray Commonly known as: FLONASE   HYDROcodone-acetaminophen 5-325 MG tablet Commonly known as: NORCO/VICODIN   tiZANidine 4 MG tablet Commonly known as: Zanaflex       TAKE these medications    albuterol 108 (90 Base) MCG/ACT inhaler Commonly known as: VENTOLIN HFA Inhale 2 puffs into the lungs every 6 (six) hours as needed for wheezing or shortness of breath.   aspirin EC 81 MG tablet Take 81 mg by mouth daily. Swallow whole.   budesonide-formoterol 160-4.5 MCG/ACT inhaler Commonly known as: SYMBICORT Inhale 2 puffs into the lungs 2 (two) times daily.   calcitRIOL 0.25 MCG capsule Commonly known as: ROCALTROL Take 0.25 mcg by mouth daily.   clobetasol 0.05 % external solution Commonly known as: TEMOVATE Apply topically.   Droplet Pen Needles 31G X 5 MM Misc Generic drug: Insulin Pen Needle Use to inject insulin daily as directed   glucose blood test strip 1 each by Other route 2 (two) times daily. Use as instructed to check blood glucose twice daily   hydrocortisone 2.5 % rectal cream Commonly known as: ANUSOL-HC Place 1 application rectally 2 (two) times daily.   Lantus SoloStar 100 UNIT/ML Solostar Pen Generic drug: insulin glargine INJECT  30 UNITS SUBCUTANEOUSLY AT BEDTIME What changed: See the new instructions.   loperamide HCl 1 MG/7.5ML suspension Commonly known as: IMODIUM Take 7.5 mLs (1 mg total) by mouth as needed for diarrhea or loose stools.   losartan 50 MG tablet Commonly known as: COZAAR Take 50 mg by mouth daily.   metoprolol succinate 25 MG 24 hr tablet Commonly known as: TOPROL-XL Take 25 mg by mouth daily.   mometasone 50 MCG/ACT nasal spray Commonly known as: NASONEX Place 2 sprays into the nose 2 (two) times daily as needed (allergies).   pantoprazole 40 MG tablet Commonly known as: PROTONIX Take 1 tablet (40 mg total) by mouth 2 (two) times daily before a  meal. What changed: See the new instructions.   simvastatin 20 MG tablet Commonly known as: ZOCOR Take 20 mg by mouth every morning.   ticagrelor 90 MG Tabs tablet Commonly known as: BRILINTA Take 1 tablet (90 mg total) by mouth 2 (two) times daily.   torsemide 100 MG tablet Commonly known as: DEMADEX Take 100 mg by mouth daily.   Tradjenta 5 MG Tabs tablet Generic drug: linagliptin TAKE 1 TABLET EVERY DAY   Vitamin D (Ergocalciferol) 1.25 MG (50000 UNIT) Caps capsule Commonly known as: DRISDOL Take 50,000 Units by mouth every 30 (thirty) days.        Allergies  Allergen Reactions   Opana [Oxymorphone Hcl] Itching   Oxymorphone Itching   Tramadol Itching    Consultations: Cardiology Nephrology.    Procedures/Studies: CARDIAC CATHETERIZATION  Result Date: 07/26/2021   Mid RCA-1 lesion is 80% stenosed.   Mid RCA-2 lesion is 75% stenosed.   Prox RCA lesion is 80% stenosed.   Prox LAD lesion is  50% stenosed.   1st Mrg lesion is 50% stenosed.   A drug-eluting stent was successfully placed using a STENT ONYX FRONTIER 3.5X15.   Balloon angioplasty was performed using a BALLN SAPPHIRE 3.0X15.   Balloon angioplasty was performed using a BALLN SAPPHIRE 3.0X15.   Post intervention, there is a 30% residual stenosis.   Post intervention, there is a 30% residual stenosis.   Post intervention, there is a 10% residual stenosis. 1.  Severe complex RCA stenosis with severe disease in the proximal, mid, and distal vessel, treated successfully with PTCA and stenting of the proximal lesion, and PTCA alone of the mid and distal lesions.  A 3.5 x 15 mm Onyx frontier DES is used to treat the proximal lesion.  The mid and distal RCA lesions had appearance of calcific and thrombotic disease, both improved with PTCA. 2.  Mild to moderate nonobstructive calcific plaquing in the proximal LAD with no severe stenoses throughout the LAD distribution. 3.  Mild to moderate calcific stenosis of the first OM  branch of the circumflex, with no high-grade stenoses throughout the circumflex distribution. Recommendations: Dual antiplatelet therapy with aspirin and ticagrelor x12 months as tolerated.  Post MI medical therapy.   DG Chest Port 1 View  Result Date: 07/22/2021 CLINICAL DATA:  chest pain EXAM: PORTABLE CHEST 1 VIEW COMPARISON:  August 29, 2018. FINDINGS: No consolidation. No visible pleural effusions or pneumothorax. Cardiomediastinal silhouette is within normal limits. No evidence of acute osseous abnormality. IMPRESSION: No evidence of acute cardiopulmonary disease. Electronically Signed   By: Margaretha Sheffield M.D.   On: 07/22/2021 14:31   ECHOCARDIOGRAM COMPLETE  Result Date: 07/23/2021    ECHOCARDIOGRAM REPORT   Patient Name:   RONDAL VANDEVELDE Date of Exam: 07/23/2021 Medical Rec #:  [id number]      Height:       64.0 in Accession #:    [id number]     Weight:       230.0 lb Date of Birth:  Mar 18, 1951     BSA:          2.076 m Patient Age:    46 years       BP:           122/61 mmHg Patient Gender: M              HR:           83 bpm. Exam Location:  [facility] Procedure: 2D Echo, Cardiac Doppler and Color Doppler Indications:    Chest Pain  History:        Patient has prior history of Echocardiogram examinations, most                 recent 08/29/2018. CAD and Previous Myocardial Infarction,                 Signs/Symptoms:Chest Pain; Risk Factors:Hypertension, Diabetes,                 Dyslipidemia and Former Smoker. COVID +.  Sonographer:    Wenda Low Referring Phys: Griffin  1. Left ventricular ejection fraction, by estimation, is 55 to 60%. The left ventricle has normal function. The left ventricle has no regional wall motion abnormalities. There is severe left ventricular hypertrophy. Left ventricular diastolic parameters  are consistent with Grade I diastolic dysfunction (impaired relaxation). Elevated left atrial pressure.  2. Right ventricular systolic function is  normal. The right ventricular size is normal. There is normal pulmonary  artery systolic pressure.  3. Left atrial size was mildly dilated.  4. Mild mitral valve regurgitation.  5. AV is thickened with minimally restricted motion. Peak and mean gradients through the valve aer 19 and 10 mm Hg respectively. . The aortic valve is tricuspid. Aortic valve regurgitation is trivial. Aortic valve sclerosis is present, with no evidence of aortic valve stenosis. FINDINGS  Left Ventricle: Left ventricular ejection fraction, by estimation, is 55 to 60%. The left ventricle has normal function. The left ventricle has no regional wall motion abnormalities. The left ventricular internal cavity size was normal in size. There is  severe left ventricular hypertrophy. Left ventricular diastolic parameters are consistent with Grade I diastolic dysfunction (impaired relaxation). Elevated left atrial pressure. Right Ventricle: The right ventricular size is normal. Right vetricular wall thickness was not assessed. Right ventricular systolic function is normal. There is normal pulmonary artery systolic pressure. The tricuspid regurgitant velocity is 2.73 m/s, and with an assumed right atrial pressure of 3 mmHg, the estimated right ventricular systolic pressure is 23.5 mmHg. Left Atrium: Left atrial size was mildly dilated. Right Atrium: Right atrial size was normal in size. Pericardium: Trivial pericardial effusion is present. Mitral Valve: There is mild thickening of the mitral valve leaflet(s). Mild mitral annular calcification. Mild mitral valve regurgitation. MV peak gradient, 8.8 mmHg. The mean mitral valve gradient is 4.0 mmHg. Tricuspid Valve: The tricuspid valve is normal in structure. Tricuspid valve regurgitation is trivial. Aortic Valve: AV is thickened with minimally restricted motion. Peak and mean gradients through the valve aer 19 and 10 mm Hg respectively. The aortic valve is tricuspid. Aortic valve regurgitation is trivial.  Aortic valve sclerosis is present, with no evidence of aortic valve stenosis. Aortic valve mean gradient measures 10.5 mmHg. Aortic valve peak gradient measures 20.5 mmHg. Aortic valve area, by VTI measures 2.02 cm. Pulmonic Valve: The pulmonic valve was normal in structure. Pulmonic valve regurgitation is not visualized. Aorta: The aortic root and ascending aorta are structurally normal, with no evidence of dilitation. IAS/Shunts: No atrial level shunt detected by color flow Doppler.  LEFT VENTRICLE PLAX 2D LVIDd:         4.20 cm     Diastology LVIDs:         2.90 cm     LV e' medial:    5.50 cm/s LV PW:         2.03 cm     LV E/e' medial:  17.6 LV IVS:        1.80 cm     LV e' lateral:   5.87 cm/s LVOT diam:     2.00 cm     LV E/e' lateral: 16.5 LV SV:         88 LV SV Index:   43 LVOT Area:     3.14 cm  LV Volumes (MOD) LV vol d, MOD A2C: 72.1 ml LV vol d, MOD A4C: 88.9 ml LV vol s, MOD A2C: 29.2 ml LV vol s, MOD A4C: 41.1 ml LV SV MOD A2C:     42.9 ml LV SV MOD A4C:     88.9 ml LV SV MOD BP:      44.8 ml RIGHT VENTRICLE RV Basal diam:  3.30 cm RV Mid diam:    3.00 cm RV S prime:     11.49 cm/s TAPSE (M-mode): 2.9 cm LEFT ATRIUM             Index        RIGHT  ATRIUM           Index LA diam:        5.20 cm 2.50 cm/m   RA Area:     20.00 cm LA Vol (A2C):   65.5 ml 31.55 ml/m  RA Volume:   49.60 ml  23.89 ml/m LA Vol (A4C):   79.3 ml 38.20 ml/m LA Biplane Vol: 74.4 ml 35.84 ml/m  AORTIC VALVE                     PULMONIC VALVE AV Area (Vmax):    1.93 cm      PV Vmax:       1.07 m/s AV Area (Vmean):   1.94 cm      PV Peak grad:  4.6 mmHg AV Area (VTI):     2.02 cm AV Vmax:           226.50 cm/s AV Vmean:          149.500 cm/s AV VTI:            0.436 m AV Peak Grad:      20.5 mmHg AV Mean Grad:      10.5 mmHg LVOT Vmax:         139.00 cm/s LVOT Vmean:        92.200 cm/s LVOT VTI:          0.281 m LVOT/AV VTI ratio: 0.64  AORTA Ao Root diam: 2.80 cm Ao Asc diam:  3.40 cm MITRAL VALVE                 TRICUSPID VALVE MV Area (PHT): 5.62 cm     TR Peak grad:   29.8 mmHg MV Area VTI:   2.52 cm     TR Vmax:        273.00 cm/s MV Peak grad:  8.8 mmHg MV Mean grad:  4.0 mmHg     SHUNTS MV Vmax:       1.48 m/s     Systemic VTI:  0.28 m MV Vmean:      87.3 cm/s    Systemic Diam: 2.00 cm MV Decel Time: 135 msec MV E velocity: 96.80 cm/s MV A velocity: 130.00 cm/s MV E/A ratio:  0.74 Dorris Carnes MD Electronically signed by Dorris Carnes MD Signature Date/Time: 07/23/2021/1:57:30 PM    Final    (Echo, Carotid, EGD, Colonoscopy, ERCP)    Subjective:   Discharge Exam: Vitals:   07/27/21 0754 07/27/21 0821  BP:    Pulse: 82   Resp:    Temp: 97.8 F (36.6 C)   SpO2: 98% 96%   Vitals:   07/27/21 0500 07/27/21 0505 07/27/21 0754 07/27/21 0821  BP:  131/83    Pulse:  79 82   Resp:  18    Temp:  98.3 F (36.8 C) 97.8 F (36.6 C)   TempSrc:  Oral Oral   SpO2:  99% 98% 96%  Weight: 101.9 kg     Height:        General: Pt is alert, awake, not in acute distress Cardiovascular: RRR, S1/S2 +, no rubs, no gallops Respiratory: CTA bilaterally, no wheezing, no rhonchi Abdominal: Soft, NT, ND, bowel sounds + Extremities: no edema, no cyanosis    The results of significant diagnostics from this hospitalization (including imaging, microbiology, ancillary and laboratory) are listed below for reference.     Microbiology: Recent Results (from the past 240 hour(s))  Resp Panel by RT-PCR (Flu  A&B, Covid) Nasopharyngeal Swab     Status: Abnormal   Collection Time: 07/22/21  3:42 PM   Specimen: Nasopharyngeal Swab; Nasopharyngeal(NP) swabs in vial transport medium  Result Value Ref Range Status   SARS Coronavirus 2 by RT PCR POSITIVE (A) NEGATIVE Final    Comment: (NOTE) SARS-CoV-2 target nucleic acids are DETECTED.  The SARS-CoV-2 RNA is generally detectable in upper respiratory specimens during the acute phase of infection. Positive results are indicative of the presence of the identified virus,  but do not rule out bacterial infection or co-infection with other pathogens not detected by the test. Clinical correlation with patient history and other diagnostic information is necessary to determine patient infection status. The expected result is Negative.  Fact Sheet for Patients: EntrepreneurPulse.com.au  Fact Sheet for Healthcare Providers: IncredibleEmployment.be  This test is not yet approved or cleared by the [place] FDA and  has been authorized for detection and/or diagnosis of SARS-CoV-2 by FDA under an Emergency Use Authorization (EUA).  This EUA will remain in effect (meaning this test can be used) for the duration of  the COVID-19 declaration under Section 564(b)(1) of the A ct, 21 U.S.C. section 360bbb-3(b)(1), unless the authorization is terminated or revoked sooner.     Influenza A by PCR NEGATIVE NEGATIVE Final   Influenza B by PCR NEGATIVE NEGATIVE Final    Comment: (NOTE) The Xpert Xpress SARS-CoV-2/FLU/RSV plus assay is intended as an aid in the diagnosis of influenza from Nasopharyngeal swab specimens and should not be used as a sole basis for treatment. Nasal washings and aspirates are unacceptable for Xpert Xpress SARS-CoV-2/FLU/RSV testing.  Fact Sheet for Patients: EntrepreneurPulse.com.au  Fact Sheet for Healthcare Providers: IncredibleEmployment.be  This test is not yet approved or cleared by the [place] FDA and has been authorized for detection and/or diagnosis of SARS-CoV-2 by FDA under an Emergency Use Authorization (EUA). This EUA will remain in effect (meaning this test can be used) for the duration of the COVID-19 declaration under Section 564(b)(1) of the Act, 21 U.S.C. section 360bbb-3(b)(1), unless the authorization is terminated or revoked.  Performed at [facility], [address], [place], [place] [address]   Gastrointestinal Panel by PCR , Stool      Status: None   Collection Time: 07/23/21  6:51 AM   Specimen: Stool  Result Value Ref Range Status   Campylobacter species NOT DETECTED NOT DETECTED Final   Plesimonas shigelloides NOT DETECTED NOT DETECTED Final   Salmonella species NOT DETECTED NOT DETECTED Final   Yersinia enterocolitica NOT DETECTED NOT DETECTED Final   Vibrio species NOT DETECTED NOT DETECTED Final   Vibrio cholerae NOT DETECTED NOT DETECTED Final   Enteroaggregative E coli (EAEC) NOT DETECTED NOT DETECTED Final   Enteropathogenic E coli (EPEC) NOT DETECTED NOT DETECTED Final   Enterotoxigenic E coli (ETEC) NOT DETECTED NOT DETECTED Final   Shiga like toxin producing E coli (STEC) NOT DETECTED NOT DETECTED Final   Shigella/Enteroinvasive E coli (EIEC) NOT DETECTED NOT DETECTED Final   Cryptosporidium NOT DETECTED NOT DETECTED Final   Cyclospora cayetanensis NOT DETECTED NOT DETECTED Final   Entamoeba histolytica NOT DETECTED NOT DETECTED Final   Giardia lamblia NOT DETECTED NOT DETECTED Final   Adenovirus F40/41 NOT DETECTED NOT DETECTED Final   Astrovirus NOT DETECTED NOT DETECTED Final   Norovirus GI/GII NOT DETECTED NOT DETECTED Final   Rotavirus A NOT DETECTED NOT DETECTED Final   Sapovirus (I, II, IV, and V) NOT DETECTED NOT DETECTED Final  Comment: Performed at [facility], [place], [place] [address]  C Difficile Quick Screen w PCR reflex     Status: Abnormal   Collection Time: 07/23/21  6:52 AM   Specimen: STOOL  Result Value Ref Range Status   C Diff antigen POSITIVE (A) NEGATIVE Final   C Diff toxin NEGATIVE NEGATIVE Final   C Diff interpretation Results are indeterminate. See PCR results.  Final    Comment: Performed at [facility], [place] [address], [place], [place] [address]  C. Diff by PCR, Reflexed     Status: None   Collection Time: 07/23/21  6:52 AM  Result Value Ref Range Status   Toxigenic C. Difficile by PCR NEGATIVE NEGATIVE Final    Comment: Patient is  colonized with non toxigenic C. difficile. May not need treatment unless significant symptoms are present. Performed at [facility], [place] [address] [place], [place] [address]      Labs: BNP (last 3 results) No results for input(s): BNP in the last 8760 hours. Basic Metabolic Panel: Recent Labs  Lab 07/22/21 1416 07/24/21 0254 07/25/21 0201 07/26/21 0555  NA 135 130* 132* 133*  K 3.7 3.3* 3.6 3.6  CL 92* 89* 92* 93*  CO2 27 26 18* 23  GLUCOSE 145* 97 111* 128*  BUN 59* 73* 87* 81*  CREATININE 17.84* 20.21* 20.76* 21.60*  CALCIUM 9.2 9.0 8.6* 8.8*  PHOS  --  8.8*  --   --    Liver Function Tests: Recent Labs  Lab 07/24/21 0254  AST 22  ALT 19  ALKPHOS 67  BILITOT 0.5  PROT 6.5  ALBUMIN 3.0*   2.8*   No results for input(s): LIPASE, AMYLASE in the last 168 hours. No results for input(s): AMMONIA in the last 168 hours. CBC: Recent Labs  Lab 07/23/21 0532 07/24/21 0254 07/25/21 0201 07/26/21 0555 07/27/21 0239  WBC 4.3 3.5* 4.5 4.1 4.7  HGB 9.5* 10.1* 9.5* 9.2* 8.6*  HCT 28.9* 30.9* 29.7* 27.6* 26.3*  MCV 92.9 91.7 91.4 89.9 89.8  PLT 83* 80* 82* 77* 83*   Cardiac Enzymes: No results for input(s): CKTOTAL, CKMB, CKMBINDEX, TROPONINI in the last 168 hours. BNP: Invalid input(s): POCBNP CBG: Recent Labs  Lab 07/26/21 0832 07/26/21 1123 07/26/21 1604 07/26/21 2036 07/27/21 0800  GLUCAP 104* 126* 107* 161* 118*   D-Dimer No results for input(s): DDIMER in the last 72 hours. Hgb A1c No results for input(s): HGBA1C in the last 72 hours. Lipid Profile No results for input(s): CHOL, HDL, LDLCALC, TRIG, CHOLHDL, LDLDIRECT in the last 72 hours. Thyroid function studies No results for input(s): TSH, T4TOTAL, T3FREE, THYROIDAB in the last 72 hours.  Invalid input(s): FREET3 Anemia work up No results for input(s): VITAMINB12, FOLATE, FERRITIN, TIBC, IRON, RETICCTPCT in the last 72 hours. Urinalysis    Component Value Date/Time   COLORURINE YELLOW  01/15/2014 2320   APPEARANCEUR CLEAR 01/15/2014 2320   LABSPEC 1.010 01/15/2014 2320   PHURINE 5.5 01/15/2014 2320   GLUCOSEU NEGATIVE 01/15/2014 2320   HGBUR TRACE (A) 01/15/2014 2320   BILIRUBINUR NEGATIVE 01/15/2014 2320   KETONESUR NEGATIVE 01/15/2014 2320   PROTEINUR 30 (A) 01/15/2014 2320   UROBILINOGEN 0.2 01/15/2014 2320   NITRITE NEGATIVE 01/15/2014 2320   LEUKOCYTESUR NEGATIVE 01/15/2014 2320   Sepsis Labs Invalid input(s): PROCALCITONIN,  WBC,  LACTICIDVEN Microbiology Recent Results (from the past 240 hour(s))  Resp Panel by RT-PCR (Flu A&B, Covid) Nasopharyngeal Swab  Status: Abnormal   Collection Time: 07/22/21  3:42 PM   Specimen: Nasopharyngeal Swab; Nasopharyngeal(NP) swabs in vial transport medium  Result Value Ref Range Status   SARS Coronavirus 2 by RT PCR POSITIVE (A) NEGATIVE Final    Comment: (NOTE) SARS-CoV-2 target nucleic acids are DETECTED.  The SARS-CoV-2 RNA is generally detectable in upper respiratory specimens during the acute phase of infection. Positive results are indicative of the presence of the identified virus, but do not rule out bacterial infection or co-infection with other pathogens not detected by the test. Clinical correlation with patient history and other diagnostic information is necessary to determine patient infection status. The expected result is Negative.  Fact Sheet for Patients: EntrepreneurPulse.com.au  Fact Sheet for Healthcare Providers: IncredibleEmployment.be  This test is not yet approved or cleared by the [place] FDA and  has been authorized for detection and/or diagnosis of SARS-CoV-2 by FDA under an Emergency Use Authorization (EUA).  This EUA will remain in effect (meaning this test can be used) for the duration of  the COVID-19 declaration under Section 564(b)(1) of the A ct, 21 U.S.C. section 360bbb-3(b)(1), unless the authorization is terminated or revoked  sooner.     Influenza A by PCR NEGATIVE NEGATIVE Final   Influenza B by PCR NEGATIVE NEGATIVE Final    Comment: (NOTE) The Xpert Xpress SARS-CoV-2/FLU/RSV plus assay is intended as an aid in the diagnosis of influenza from Nasopharyngeal swab specimens and should not be used as a sole basis for treatment. Nasal washings and aspirates are unacceptable for Xpert Xpress SARS-CoV-2/FLU/RSV testing.  Fact Sheet for Patients: EntrepreneurPulse.com.au  Fact Sheet for Healthcare Providers: IncredibleEmployment.be  This test is not yet approved or cleared by the [place] FDA and has been authorized for detection and/or diagnosis of SARS-CoV-2 by FDA under an Emergency Use Authorization (EUA). This EUA will remain in effect (meaning this test can be used) for the duration of the COVID-19 declaration under Section 564(b)(1) of the Act, 21 U.S.C. section 360bbb-3(b)(1), unless the authorization is terminated or revoked.  Performed at [facility], [address] [place], [place] [address]   Gastrointestinal Panel by PCR , Stool     Status: None   Collection Time: 07/23/21  6:51 AM   Specimen: Stool  Result Value Ref Range Status   Campylobacter species NOT DETECTED NOT DETECTED Final   Plesimonas shigelloides NOT DETECTED NOT DETECTED Final   Salmonella species NOT DETECTED NOT DETECTED Final   Yersinia enterocolitica NOT DETECTED NOT DETECTED Final   Vibrio species NOT DETECTED NOT DETECTED Final   Vibrio cholerae NOT DETECTED NOT DETECTED Final   Enteroaggregative E coli (EAEC) NOT DETECTED NOT DETECTED Final   Enteropathogenic E coli (EPEC) NOT DETECTED NOT DETECTED Final   Enterotoxigenic E coli (ETEC) NOT DETECTED NOT DETECTED Final   Shiga like toxin producing E coli (STEC) NOT DETECTED NOT DETECTED Final   Shigella/Enteroinvasive E coli (EIEC) NOT DETECTED NOT DETECTED Final   Cryptosporidium NOT DETECTED NOT DETECTED Final   Cyclospora  cayetanensis NOT DETECTED NOT DETECTED Final   Entamoeba histolytica NOT DETECTED NOT DETECTED Final   Giardia lamblia NOT DETECTED NOT DETECTED Final   Adenovirus F40/41 NOT DETECTED NOT DETECTED Final   Astrovirus NOT DETECTED NOT DETECTED Final   Norovirus GI/GII NOT DETECTED NOT DETECTED Final   Rotavirus A NOT DETECTED NOT DETECTED Final   Sapovirus (I, II, IV, and V) NOT DETECTED NOT DETECTED Final    Comment: Performed at [facility],  [place], [place] [address]  C Difficile Quick Screen w PCR reflex     Status: Abnormal   Collection Time: 07/23/21  6:52 AM   Specimen: STOOL  Result Value Ref Range Status   C Diff antigen POSITIVE (A) NEGATIVE Final   C Diff toxin NEGATIVE NEGATIVE Final   C Diff interpretation Results are indeterminate. See PCR results.  Final    Comment: Performed at [facility], [place] [address], [place], [place] [address]  C. Diff by PCR, Reflexed     Status: None   Collection Time: 07/23/21  6:52 AM  Result Value Ref Range Status   Toxigenic C. Difficile by PCR NEGATIVE NEGATIVE Final    Comment: Patient is colonized with non toxigenic C. difficile. May not need treatment unless significant symptoms are present. Performed at [facility], [place] [address] [place], [place] [address]      Time coordinating discharge: 39 minutes.   SIGNED:   Hosie Poisson, MD  [facility] 07/27/2021, 6:06 PM

## 2021-07-27 NOTE — Progress Notes (Signed)
Seen patient on chair, complaining of burning sensation/ discomfort on bilateral shoulder blades, 8/10. Denied chest pain. Stable vitals signs. PRN Tylenol given per request of the patient. PD monitor shows completed treatment. Will monitor.    Willadean Carol Carlton Buskey, BSN, [employer]

## 2021-07-28 DIAGNOSIS — Z992 Dependence on renal dialysis: Secondary | ICD-10-CM | POA: Diagnosis not present

## 2021-07-28 DIAGNOSIS — N2581 Secondary hyperparathyroidism of renal origin: Secondary | ICD-10-CM | POA: Diagnosis not present

## 2021-07-28 DIAGNOSIS — N186 End stage renal disease: Secondary | ICD-10-CM | POA: Diagnosis not present

## 2021-07-28 DIAGNOSIS — D509 Iron deficiency anemia, unspecified: Secondary | ICD-10-CM | POA: Diagnosis not present

## 2021-07-28 DIAGNOSIS — D631 Anemia in chronic kidney disease: Secondary | ICD-10-CM | POA: Diagnosis not present

## 2021-07-29 ENCOUNTER — Emergency Department (HOSPITAL_COMMUNITY): Payer: Medicare Other

## 2021-07-29 ENCOUNTER — Emergency Department (HOSPITAL_COMMUNITY)
Admission: EM | Admit: 2021-07-29 | Discharge: 2021-07-29 | Disposition: A | Discharge status: Home / Self Care | Payer: Medicare Other | Attending: Emergency Medicine | Admitting: Emergency Medicine

## 2021-07-29 ENCOUNTER — Encounter (HOSPITAL_COMMUNITY): Payer: Self-pay | Admitting: *Deleted

## 2021-07-29 DIAGNOSIS — E1122 Type 2 diabetes mellitus with diabetic chronic kidney disease: Secondary | ICD-10-CM | POA: Insufficient documentation

## 2021-07-29 DIAGNOSIS — Z794 Long term (current) use of insulin: Secondary | ICD-10-CM | POA: Diagnosis not present

## 2021-07-29 DIAGNOSIS — Z992 Dependence on renal dialysis: Secondary | ICD-10-CM | POA: Diagnosis not present

## 2021-07-29 DIAGNOSIS — J449 Chronic obstructive pulmonary disease, unspecified: Secondary | ICD-10-CM | POA: Insufficient documentation

## 2021-07-29 DIAGNOSIS — Z7951 Long term (current) use of inhaled steroids: Secondary | ICD-10-CM | POA: Diagnosis not present

## 2021-07-29 DIAGNOSIS — N186 End stage renal disease: Secondary | ICD-10-CM | POA: Insufficient documentation

## 2021-07-29 DIAGNOSIS — R0602 Shortness of breath: Secondary | ICD-10-CM | POA: Insufficient documentation

## 2021-07-29 DIAGNOSIS — Z792 Long term (current) use of antibiotics: Secondary | ICD-10-CM | POA: Insufficient documentation

## 2021-07-29 DIAGNOSIS — Z79899 Other long term (current) drug therapy: Secondary | ICD-10-CM | POA: Insufficient documentation

## 2021-07-29 DIAGNOSIS — I959 Hypotension, unspecified: Secondary | ICD-10-CM | POA: Insufficient documentation

## 2021-07-29 DIAGNOSIS — I2510 Atherosclerotic heart disease of native coronary artery without angina pectoris: Secondary | ICD-10-CM | POA: Diagnosis not present

## 2021-07-29 DIAGNOSIS — I1311 Hypertensive heart and chronic kidney disease without heart failure, with stage 5 chronic kidney disease, or end stage renal disease: Secondary | ICD-10-CM | POA: Diagnosis not present

## 2021-07-29 DIAGNOSIS — D509 Iron deficiency anemia, unspecified: Secondary | ICD-10-CM | POA: Diagnosis not present

## 2021-07-29 DIAGNOSIS — N2581 Secondary hyperparathyroidism of renal origin: Secondary | ICD-10-CM | POA: Diagnosis not present

## 2021-07-29 DIAGNOSIS — D631 Anemia in chronic kidney disease: Secondary | ICD-10-CM | POA: Diagnosis not present

## 2021-07-29 DIAGNOSIS — I4891 Unspecified atrial fibrillation: Secondary | ICD-10-CM

## 2021-07-29 LAB — BASIC METABOLIC PANEL
Anion gap: 17 — ABNORMAL HIGH (ref 5–15)
BUN: 67 mg/dL — ABNORMAL HIGH (ref 8–23)
CO2: 27 mmol/L (ref 22–32)
Calcium: 9.4 mg/dL (ref 8.9–10.3)
Chloride: 90 mmol/L — ABNORMAL LOW (ref 98–111)
Creatinine, Ser: 20.22 mg/dL — ABNORMAL HIGH (ref 0.61–1.24)
GFR, Estimated: 2 mL/min — ABNORMAL LOW (ref >60)
Glucose, Bld: 124 mg/dL — ABNORMAL HIGH (ref 70–99)
Potassium: 4 mmol/L (ref 3.5–5.1)
Sodium: 134 mmol/L — ABNORMAL LOW (ref 135–145)

## 2021-07-29 LAB — CBC
HCT: 31 % — ABNORMAL LOW (ref 39.0–52.0)
Hemoglobin: 9.9 g/dL — ABNORMAL LOW (ref 13.0–17.0)
MCH: 30.2 pg (ref 26.0–34.0)
MCHC: 31.9 g/dL (ref 30.0–36.0)
MCV: 94.5 fL (ref 80.0–100.0)
Platelets: 144 10*3/uL — ABNORMAL LOW (ref 150–400)
RBC: 3.28 MIL/uL — ABNORMAL LOW (ref 4.22–5.81)
RDW: 13.4 % (ref 11.5–15.5)
WBC: 6.5 10*3/uL (ref 4.0–10.5)
nRBC: 0 % (ref 0.0–0.2)

## 2021-07-29 LAB — TROPONIN I (HIGH SENSITIVITY)
Troponin I (High Sensitivity): 1120 ng/L (ref <18)
Troponin I (High Sensitivity): 1114 ng/L (ref <18)

## 2021-07-29 LAB — MAGNESIUM: Magnesium: 2.4 mg/dL (ref 1.7–2.4)

## 2021-07-29 LAB — TSH: TSH: 3.386 u[IU]/mL (ref 0.350–4.500)

## 2021-07-29 LAB — BRAIN NATRIURETIC PEPTIDE: B Natriuretic Peptide: 154 pg/mL — ABNORMAL HIGH (ref 0.0–100.0)

## 2021-07-29 MED ORDER — SODIUM CHLORIDE 0.9 % IV BOLUS: 250.0000 mL | Freq: Once | INTRAVENOUS | Status: DC

## 2021-07-29 MED ORDER — SODIUM CHLORIDE 0.9 % IV BOLUS
500.0000 mL | Freq: Once | INTRAVENOUS | Status: AC
  Administered 2021-07-29: 500 mL via INTRAVENOUS

## 2021-07-29 NOTE — Discharge Instructions (Addendum)
Please discontinue taking your losartan, take your blood pressure twice a day and if your blood pressures are low do not take your losartan.  If your blood pressures are continuing to stay very low even without this medication you may need to consider cutting your metoprolol in half.  It is very importantly follow-up very closely with the prescribing physician and Dr. Debara Pickett as soon as possible.  Return for any new or worsening symptoms

## 2021-07-29 NOTE — ED Notes (Signed)
Patient refusing IV, labs x two Rns in room. PA aware.

## 2021-07-29 NOTE — ED Triage Notes (Signed)
Referred from PCP due to low blood pressure reading, recently had stent placement

## 2021-07-29 NOTE — ED Provider Notes (Signed)
[facility] EMERGENCY DEPARTMENT Provider Note   CSN: [id number] Arrival date & time: 07/29/21  1230     History {Add pertinent medical, surgical, social history, OB history to  Chief Complaint  Patient presents with   Hypotension    [name] [name] is a 71 y.o. male with a past medical history of diabetes, COPD, CAD, hypertension, hyperlipidemia, diabetes and end-stage renal disease on peritoneal dialysis at home nightly.  Patient suffered an NSTEMI and was hospitalized.  He was discharged 2 days ago.  On Monday the patient underwent a heart catheterization that showed severe RCA disease.  He is status post PCI with stent and significant improvement in his blood flow.  Patient went to his primary care doctor today and was immediately sent to the emergency per patient due to "low blood pressure."  Patient states that he has been weak since he was hospitalized but denies any near syncopal feelings, palpitations, shortness of breath.  Patient states that  HPI     Home Medications Prior to Admission medications   Medication Sig Start Date End Date Taking? Authorizing Provider  albuterol (VENTOLIN HFA) 108 (90 Base) MCG/ACT inhaler Inhale 2 puffs into the lungs every 6 (six) hours as needed for wheezing or shortness of breath. 03/25/21  Yes Rigoberto Noel, MD  aspirin EC 81 MG tablet Take 81 mg by mouth daily. Swallow whole.   Yes [provider]  budesonide-formoterol (SYMBICORT) 160-4.5 MCG/ACT inhaler Inhale 2 puffs into the lungs 2 (two) times daily. 04/07/20  Yes Collene Gobble, MD  calcitRIOL (ROCALTROL) 0.25 MCG capsule Take 0.25 mcg by mouth daily.   Yes [provider]  glucose blood test strip 1 each by Other route 2 (two) times daily. Use as instructed to check blood glucose twice daily 02/22/21  Yes Reardon, Loree Fee J, NP  Insulin Pen Needle (DROPLET PEN NEEDLES) 31G X 5 MM MISC Use to inject insulin daily as directed 12/30/20  Yes Nida, Marella Chimes, MD  LANTUS  SOLOSTAR 100 UNIT/ML Solostar Pen INJECT  30 UNITS SUBCUTANEOUSLY AT BEDTIME Patient taking differently: 30 Units at bedtime. 12/15/20  Yes Reardon, Juanetta Beets, NP  losartan (COZAAR) 50 MG tablet Take 50 mg by mouth daily. 05/14/21  Yes [provider]  metoprolol succinate (TOPROL-XL) 25 MG 24 hr tablet Take 25 mg by mouth daily.   Yes [provider]  pantoprazole (PROTONIX) 40 MG tablet Take 1 tablet (40 mg total) by mouth 2 (two) times daily before a meal. 07/25/21  Yes Jodi Mourning, Kristen S, PA-C  simvastatin (ZOCOR) 20 MG tablet Take 20 mg by mouth every morning.   Yes [provider]  ticagrelor (BRILINTA) 90 MG TABS tablet Take 1 tablet (90 mg total) by mouth 2 (two) times daily. 07/27/21  Yes Hosie Poisson, MD  torsemide (DEMADEX) 100 MG tablet Take 100 mg by mouth daily. 05/03/21  Yes [provider]  TRADJENTA 5 MG TABS tablet TAKE 1 TABLET EVERY DAY 07/19/21  Yes Reardon, Juanetta Beets, NP  Vitamin D, Ergocalciferol, (DRISDOL) 1.25 MG (50000 UNIT) CAPS capsule Take 50,000 Units by mouth every 30 (thirty) days. 05/03/21  Yes [provider]  hydrocortisone (ANUSOL-HC) 2.5 % rectal cream Place 1 application rectally 2 (two) times daily. Patient not taking: Reported on 07/29/2021 07/13/21   Annitta Needs, NP  loperamide HCl (IMODIUM) 1 MG/7.5ML suspension Take 7.5 mLs (1 mg total) by mouth as needed for diarrhea or loose stools. Patient not taking: Reported on 07/29/2021 07/27/21  Hosie Poisson, MD      Allergies    Opana [oxymorphone hcl], Oxymorphone, and Tramadol    Review of Systems   Review of Systems  Physical Exam Updated Vital Signs BP 103/78    Pulse 88    Temp 98.7 F (37.1 C) (Oral)    Resp 16    SpO2 100%  Physical Exam  ED Results / Procedures / Treatments   Labs (all labs ordered are listed, but only abnormal results are displayed) Labs Reviewed  BASIC METABOLIC PANEL - Abnormal; Notable for the following components:      Result Value    Sodium 134 (*)    Chloride 90 (*)    Glucose, Bld 124 (*)    BUN 67 (*)    Creatinine, Ser 20.22 (*)    GFR, Estimated 2 (*)    Anion gap 17 (*)    All other components within normal limits  CBC - Abnormal; Notable for the following components:   RBC 3.28 (*)    Hemoglobin 9.9 (*)    HCT 31.0 (*)    Platelets 144 (*)    All other components within normal limits  BRAIN NATRIURETIC PEPTIDE - Abnormal; Notable for the following components:   B Natriuretic Peptide 154.0 (*)    All other components within normal limits  TROPONIN I (HIGH SENSITIVITY) - Abnormal; Notable for the following components:   Troponin I (High Sensitivity) 1,120 (*)    All other components within normal limits  TROPONIN I (HIGH SENSITIVITY) - Abnormal; Notable for the following components:   Troponin I (High Sensitivity) 1,114 (*)    All other components within normal limits  MAGNESIUM  TSH    EKG None  Radiology DG Chest Port 1 View  Result Date: 07/29/2021 CLINICAL DATA:  Shortness of breath.  COPD.  Stent placed recently. EXAM: PORTABLE CHEST 1 VIEW COMPARISON:  AP chest 07/22/2021 FINDINGS: Cardiac silhouette and mediastinal contours are unchanged and within normal limits with mild calcification again seen within the aortic arch. The lungs are clear. No pleural effusion or pneumothorax. Mild chronic mineralization superolateral humeral head, possibly from chronic superior rotator cuff calcific tendinosis. IMPRESSION: No acute cardiopulmonary disease process. Electronically Signed   By: Yvonne Kendall M.D.   On: 07/29/2021 14:10    Procedures Procedures    Medications Ordered in ED Medications  sodium chloride 0.9 % bolus 500 mL (0 mLs Intravenous Stopped 07/29/21 1622)    ED Course/ Medical Decision Making/ A&P Clinical Course as of 07/29/21 1950  Fri Jul 29, 2021  1556 Consult with Dr. Domenic Polite- asks that we cut the Losartan to 25 at this time.  [AH]  1558 Patient states that he was only taking  25 mg of Losartan so I have advised him to discontinue the medication [AH]    Clinical Course User Index [AH] Margarita Mail, PA-C                           Medical Decision Making  This patient presents to the ED for concern of low blood pressure, this involves an extensive number of treatment options, and is a complaint that carries with it a high risk of complications and morbidity.  The differential diagnosis includes heart failure, bradycardia, overdiuresis is due to dialysis, antihypertensive medication overdose   Co morbidities that complicate the patient evaluation  End-stage renal disease, recent MI   Additional history obtained:  Additional history obtained from patient's  wife at bedside    Lab Tests:  I Ordered, and personally interpreted labs.  The pertinent results include:   Opponent elevated at 1114.  This is significantly improved from several days ago after patient's MI.  Patient also with end-stage renal disease and likely will not clear as fast.  No chest pain. BNP at 154 not significantly elevated, CBC with stable anemia, TSH within normal limits, normal mag level and BMP shows BUN of 67 creatinine of 20 consistent with history of end-stage renal disease   Imaging Studies ordered:  I ordered imaging studies including portable 1 view chest x-ray I independently visualized and interpreted imaging which showed no acute findings I agree with the radiologist interpretation   Cardiac Monitoring:  The patient was maintained on a cardiac monitor.  I personally viewed and interpreted the cardiac monitored which showed an underlying rhythm of: Sinus rhythm with ectopy    Medicines ordered and prescription drug management:  I ordered medication including IV fluid resuscitation for hypotension Reevaluation of the patient after these medicines showed that the patient improved I have reviewed the patients home medicines and have made adjustments as  needed         Consultations Obtained:  I requested consultation with the cardiologist Dr. Domenic Polite,  and discussed lab and imaging findings as well as pertinent plan - they recommend: No need for readmission, we reviewed the EKGs which showed no evidence of A. fib but more likely ectopy.  He has advised that we decrease his blood pressure medication starting with losartan   Problem List / ED Course:  Hypertension   Reevaluation:  After the interventions noted above, I reevaluated the patient and found that they have :improved   Social Determinants of Health:  Patient has close outpatient cardiology follow-up   Dispostion:  After consideration of the diagnostic results and the patients response to treatment, I feel that the patent would benefit from discharge.  Final Clinical Impression(s) / ED Diagnoses Final diagnoses:  Hypotension, unspecified hypotension type    Rx / DC Orders ED Discharge Orders          Ordered    Amb referral to [facility]  Status:  Canceled        07/29/21 [place], [place], PA-C 07/29/21 1957    Milton Ferguson, MD 07/30/21 1104

## 2021-07-30 DIAGNOSIS — Z992 Dependence on renal dialysis: Secondary | ICD-10-CM | POA: Diagnosis not present

## 2021-07-30 DIAGNOSIS — D631 Anemia in chronic kidney disease: Secondary | ICD-10-CM | POA: Diagnosis not present

## 2021-07-30 DIAGNOSIS — D509 Iron deficiency anemia, unspecified: Secondary | ICD-10-CM | POA: Diagnosis not present

## 2021-07-30 DIAGNOSIS — N186 End stage renal disease: Secondary | ICD-10-CM | POA: Diagnosis not present

## 2021-07-30 DIAGNOSIS — N2581 Secondary hyperparathyroidism of renal origin: Secondary | ICD-10-CM | POA: Diagnosis not present

## 2021-07-31 DIAGNOSIS — N186 End stage renal disease: Secondary | ICD-10-CM | POA: Diagnosis not present

## 2021-07-31 DIAGNOSIS — D509 Iron deficiency anemia, unspecified: Secondary | ICD-10-CM | POA: Diagnosis not present

## 2021-07-31 DIAGNOSIS — D631 Anemia in chronic kidney disease: Secondary | ICD-10-CM | POA: Diagnosis not present

## 2021-07-31 DIAGNOSIS — N2581 Secondary hyperparathyroidism of renal origin: Secondary | ICD-10-CM | POA: Diagnosis not present

## 2021-07-31 DIAGNOSIS — Z992 Dependence on renal dialysis: Secondary | ICD-10-CM | POA: Diagnosis not present

## 2021-08-01 DIAGNOSIS — D631 Anemia in chronic kidney disease: Secondary | ICD-10-CM | POA: Diagnosis not present

## 2021-08-01 DIAGNOSIS — N186 End stage renal disease: Secondary | ICD-10-CM | POA: Diagnosis not present

## 2021-08-01 DIAGNOSIS — Z992 Dependence on renal dialysis: Secondary | ICD-10-CM | POA: Diagnosis not present

## 2021-08-01 DIAGNOSIS — D509 Iron deficiency anemia, unspecified: Secondary | ICD-10-CM | POA: Diagnosis not present

## 2021-08-01 DIAGNOSIS — N2581 Secondary hyperparathyroidism of renal origin: Secondary | ICD-10-CM | POA: Diagnosis not present

## 2021-08-02 DIAGNOSIS — N2581 Secondary hyperparathyroidism of renal origin: Secondary | ICD-10-CM | POA: Diagnosis not present

## 2021-08-02 DIAGNOSIS — Z992 Dependence on renal dialysis: Secondary | ICD-10-CM | POA: Diagnosis not present

## 2021-08-02 DIAGNOSIS — D509 Iron deficiency anemia, unspecified: Secondary | ICD-10-CM | POA: Diagnosis not present

## 2021-08-02 DIAGNOSIS — D631 Anemia in chronic kidney disease: Secondary | ICD-10-CM | POA: Diagnosis not present

## 2021-08-02 DIAGNOSIS — N186 End stage renal disease: Secondary | ICD-10-CM | POA: Diagnosis not present

## 2021-08-03 DIAGNOSIS — D509 Iron deficiency anemia, unspecified: Secondary | ICD-10-CM | POA: Diagnosis not present

## 2021-08-03 DIAGNOSIS — N2581 Secondary hyperparathyroidism of renal origin: Secondary | ICD-10-CM | POA: Diagnosis not present

## 2021-08-03 DIAGNOSIS — N186 End stage renal disease: Secondary | ICD-10-CM | POA: Diagnosis not present

## 2021-08-03 DIAGNOSIS — D631 Anemia in chronic kidney disease: Secondary | ICD-10-CM | POA: Diagnosis not present

## 2021-08-03 DIAGNOSIS — Z992 Dependence on renal dialysis: Secondary | ICD-10-CM | POA: Diagnosis not present

## 2021-08-04 DIAGNOSIS — I872 Venous insufficiency (chronic) (peripheral): Secondary | ICD-10-CM | POA: Diagnosis not present

## 2021-08-04 DIAGNOSIS — L853 Xerosis cutis: Secondary | ICD-10-CM | POA: Diagnosis not present

## 2021-08-04 DIAGNOSIS — D509 Iron deficiency anemia, unspecified: Secondary | ICD-10-CM | POA: Diagnosis not present

## 2021-08-04 DIAGNOSIS — L308 Other specified dermatitis: Secondary | ICD-10-CM | POA: Diagnosis not present

## 2021-08-04 DIAGNOSIS — N2581 Secondary hyperparathyroidism of renal origin: Secondary | ICD-10-CM | POA: Diagnosis not present

## 2021-08-04 DIAGNOSIS — Z992 Dependence on renal dialysis: Secondary | ICD-10-CM | POA: Diagnosis not present

## 2021-08-04 DIAGNOSIS — D631 Anemia in chronic kidney disease: Secondary | ICD-10-CM | POA: Diagnosis not present

## 2021-08-04 DIAGNOSIS — N186 End stage renal disease: Secondary | ICD-10-CM | POA: Diagnosis not present

## 2021-08-05 DIAGNOSIS — N186 End stage renal disease: Secondary | ICD-10-CM | POA: Diagnosis not present

## 2021-08-05 DIAGNOSIS — Z992 Dependence on renal dialysis: Secondary | ICD-10-CM | POA: Diagnosis not present

## 2021-08-05 DIAGNOSIS — D509 Iron deficiency anemia, unspecified: Secondary | ICD-10-CM | POA: Diagnosis not present

## 2021-08-05 DIAGNOSIS — D631 Anemia in chronic kidney disease: Secondary | ICD-10-CM | POA: Diagnosis not present

## 2021-08-05 DIAGNOSIS — N2581 Secondary hyperparathyroidism of renal origin: Secondary | ICD-10-CM | POA: Diagnosis not present

## 2021-08-06 DIAGNOSIS — N186 End stage renal disease: Secondary | ICD-10-CM | POA: Diagnosis not present

## 2021-08-06 DIAGNOSIS — D509 Iron deficiency anemia, unspecified: Secondary | ICD-10-CM | POA: Diagnosis not present

## 2021-08-06 DIAGNOSIS — N2581 Secondary hyperparathyroidism of renal origin: Secondary | ICD-10-CM | POA: Diagnosis not present

## 2021-08-06 DIAGNOSIS — D631 Anemia in chronic kidney disease: Secondary | ICD-10-CM | POA: Diagnosis not present

## 2021-08-06 DIAGNOSIS — Z992 Dependence on renal dialysis: Secondary | ICD-10-CM | POA: Diagnosis not present

## 2021-08-07 DIAGNOSIS — D509 Iron deficiency anemia, unspecified: Secondary | ICD-10-CM | POA: Diagnosis not present

## 2021-08-07 DIAGNOSIS — D631 Anemia in chronic kidney disease: Secondary | ICD-10-CM | POA: Diagnosis not present

## 2021-08-07 DIAGNOSIS — N186 End stage renal disease: Secondary | ICD-10-CM | POA: Diagnosis not present

## 2021-08-07 DIAGNOSIS — N2581 Secondary hyperparathyroidism of renal origin: Secondary | ICD-10-CM | POA: Diagnosis not present

## 2021-08-07 DIAGNOSIS — Z992 Dependence on renal dialysis: Secondary | ICD-10-CM | POA: Diagnosis not present

## 2021-08-08 DIAGNOSIS — N186 End stage renal disease: Secondary | ICD-10-CM | POA: Diagnosis not present

## 2021-08-08 DIAGNOSIS — Z992 Dependence on renal dialysis: Secondary | ICD-10-CM | POA: Diagnosis not present

## 2021-08-08 DIAGNOSIS — D509 Iron deficiency anemia, unspecified: Secondary | ICD-10-CM | POA: Diagnosis not present

## 2021-08-08 DIAGNOSIS — N2581 Secondary hyperparathyroidism of renal origin: Secondary | ICD-10-CM | POA: Diagnosis not present

## 2021-08-08 DIAGNOSIS — D631 Anemia in chronic kidney disease: Secondary | ICD-10-CM | POA: Diagnosis not present

## 2021-08-09 ENCOUNTER — Ambulatory Visit (HOSPITAL_COMMUNITY)
Admission: RE | Admit: 2021-08-09 | Discharge: 2021-08-09 | Disposition: A | Discharge status: Home / Self Care | Payer: Medicare Other | Source: Ambulatory Visit | Attending: Gerontology | Admitting: Gerontology

## 2021-08-09 ENCOUNTER — Other Ambulatory Visit (HOSPITAL_COMMUNITY): Payer: Self-pay | Admitting: Gerontology

## 2021-08-09 ENCOUNTER — Other Ambulatory Visit: Payer: Self-pay

## 2021-08-09 DIAGNOSIS — R0989 Other specified symptoms and signs involving the circulatory and respiratory systems: Secondary | ICD-10-CM | POA: Insufficient documentation

## 2021-08-09 DIAGNOSIS — I959 Hypotension, unspecified: Secondary | ICD-10-CM | POA: Diagnosis not present

## 2021-08-09 DIAGNOSIS — N2581 Secondary hyperparathyroidism of renal origin: Secondary | ICD-10-CM | POA: Diagnosis not present

## 2021-08-09 DIAGNOSIS — E1159 Type 2 diabetes mellitus with other circulatory complications: Secondary | ICD-10-CM | POA: Diagnosis not present

## 2021-08-09 DIAGNOSIS — Z992 Dependence on renal dialysis: Secondary | ICD-10-CM | POA: Diagnosis not present

## 2021-08-09 DIAGNOSIS — R059 Cough, unspecified: Secondary | ICD-10-CM | POA: Insufficient documentation

## 2021-08-09 DIAGNOSIS — N186 End stage renal disease: Secondary | ICD-10-CM | POA: Diagnosis not present

## 2021-08-09 DIAGNOSIS — D509 Iron deficiency anemia, unspecified: Secondary | ICD-10-CM | POA: Diagnosis not present

## 2021-08-09 DIAGNOSIS — R109 Unspecified abdominal pain: Secondary | ICD-10-CM | POA: Diagnosis not present

## 2021-08-09 DIAGNOSIS — D631 Anemia in chronic kidney disease: Secondary | ICD-10-CM | POA: Diagnosis not present

## 2021-08-10 DIAGNOSIS — N186 End stage renal disease: Secondary | ICD-10-CM | POA: Diagnosis not present

## 2021-08-10 DIAGNOSIS — D509 Iron deficiency anemia, unspecified: Secondary | ICD-10-CM | POA: Diagnosis not present

## 2021-08-10 DIAGNOSIS — N2581 Secondary hyperparathyroidism of renal origin: Secondary | ICD-10-CM | POA: Diagnosis not present

## 2021-08-10 DIAGNOSIS — Z992 Dependence on renal dialysis: Secondary | ICD-10-CM | POA: Diagnosis not present

## 2021-08-10 DIAGNOSIS — D631 Anemia in chronic kidney disease: Secondary | ICD-10-CM | POA: Diagnosis not present

## 2021-08-11 DIAGNOSIS — D631 Anemia in chronic kidney disease: Secondary | ICD-10-CM | POA: Diagnosis not present

## 2021-08-11 DIAGNOSIS — D509 Iron deficiency anemia, unspecified: Secondary | ICD-10-CM | POA: Diagnosis not present

## 2021-08-11 DIAGNOSIS — Z992 Dependence on renal dialysis: Secondary | ICD-10-CM | POA: Diagnosis not present

## 2021-08-11 DIAGNOSIS — N2581 Secondary hyperparathyroidism of renal origin: Secondary | ICD-10-CM | POA: Diagnosis not present

## 2021-08-11 DIAGNOSIS — N186 End stage renal disease: Secondary | ICD-10-CM | POA: Diagnosis not present

## 2021-08-12 ENCOUNTER — Telehealth: Payer: Self-pay | Admitting: Pulmonary Disease

## 2021-08-12 DIAGNOSIS — N186 End stage renal disease: Secondary | ICD-10-CM | POA: Diagnosis not present

## 2021-08-12 DIAGNOSIS — Z992 Dependence on renal dialysis: Secondary | ICD-10-CM | POA: Diagnosis not present

## 2021-08-12 DIAGNOSIS — D509 Iron deficiency anemia, unspecified: Secondary | ICD-10-CM | POA: Diagnosis not present

## 2021-08-12 DIAGNOSIS — N2581 Secondary hyperparathyroidism of renal origin: Secondary | ICD-10-CM | POA: Diagnosis not present

## 2021-08-12 DIAGNOSIS — D631 Anemia in chronic kidney disease: Secondary | ICD-10-CM | POA: Diagnosis not present

## 2021-08-12 NOTE — Telephone Encounter (Signed)
Called and spoke to patient. Made him aware that RA said the week of 10/24/2021 was ok for an appointment. Scheduled patient for 4/10. Appt reminder mailed. Nothing further needed.

## 2021-08-12 NOTE — Telephone Encounter (Signed)
"  Pt received CPAP yesterday. Needs f/u in 30 days with RA.Pt sees Dr. Elsworth Soho in [place]. No openings during that time. Routing to [facility] clinical to see if they can overbook for RA. "  First available in [facility] office with Dr. Elsworth Soho is 10/24/2021 which would be between the 31-90 day guideline for insurance purposes. Dr. Elsworth Soho is this okay with you or would you like patient to be double booked on [facility] schedule?

## 2021-08-13 DIAGNOSIS — D509 Iron deficiency anemia, unspecified: Secondary | ICD-10-CM | POA: Diagnosis not present

## 2021-08-13 DIAGNOSIS — Z992 Dependence on renal dialysis: Secondary | ICD-10-CM | POA: Diagnosis not present

## 2021-08-13 DIAGNOSIS — D631 Anemia in chronic kidney disease: Secondary | ICD-10-CM | POA: Diagnosis not present

## 2021-08-13 DIAGNOSIS — N186 End stage renal disease: Secondary | ICD-10-CM | POA: Diagnosis not present

## 2021-08-13 DIAGNOSIS — N2581 Secondary hyperparathyroidism of renal origin: Secondary | ICD-10-CM | POA: Diagnosis not present

## 2021-08-14 ENCOUNTER — Encounter (HOSPITAL_COMMUNITY): Payer: Self-pay

## 2021-08-14 ENCOUNTER — Emergency Department (HOSPITAL_COMMUNITY)
Admission: EM | Admit: 2021-08-14 | Discharge: 2021-08-15 | Disposition: A | Discharge status: Home / Self Care | Payer: Medicare Other | Attending: Emergency Medicine | Admitting: Emergency Medicine

## 2021-08-14 ENCOUNTER — Other Ambulatory Visit: Payer: Self-pay

## 2021-08-14 DIAGNOSIS — I700 Atherosclerosis of aorta: Secondary | ICD-10-CM | POA: Diagnosis not present

## 2021-08-14 DIAGNOSIS — M546 Pain in thoracic spine: Secondary | ICD-10-CM | POA: Insufficient documentation

## 2021-08-14 DIAGNOSIS — R109 Unspecified abdominal pain: Secondary | ICD-10-CM | POA: Diagnosis not present

## 2021-08-14 DIAGNOSIS — I120 Hypertensive chronic kidney disease with stage 5 chronic kidney disease or end stage renal disease: Secondary | ICD-10-CM | POA: Diagnosis not present

## 2021-08-14 DIAGNOSIS — N261 Atrophy of kidney (terminal): Secondary | ICD-10-CM | POA: Diagnosis not present

## 2021-08-14 DIAGNOSIS — Z992 Dependence on renal dialysis: Secondary | ICD-10-CM | POA: Diagnosis not present

## 2021-08-14 DIAGNOSIS — N186 End stage renal disease: Secondary | ICD-10-CM | POA: Diagnosis not present

## 2021-08-14 DIAGNOSIS — M549 Dorsalgia, unspecified: Secondary | ICD-10-CM

## 2021-08-14 DIAGNOSIS — Z7982 Long term (current) use of aspirin: Secondary | ICD-10-CM | POA: Insufficient documentation

## 2021-08-14 DIAGNOSIS — Z79899 Other long term (current) drug therapy: Secondary | ICD-10-CM | POA: Diagnosis not present

## 2021-08-14 DIAGNOSIS — E1122 Type 2 diabetes mellitus with diabetic chronic kidney disease: Secondary | ICD-10-CM | POA: Insufficient documentation

## 2021-08-14 DIAGNOSIS — I2510 Atherosclerotic heart disease of native coronary artery without angina pectoris: Secondary | ICD-10-CM | POA: Diagnosis not present

## 2021-08-14 DIAGNOSIS — Z7984 Long term (current) use of oral hypoglycemic drugs: Secondary | ICD-10-CM | POA: Insufficient documentation

## 2021-08-14 DIAGNOSIS — M545 Low back pain, unspecified: Secondary | ICD-10-CM | POA: Diagnosis not present

## 2021-08-14 DIAGNOSIS — U071 COVID-19: Secondary | ICD-10-CM

## 2021-08-14 DIAGNOSIS — R059 Cough, unspecified: Secondary | ICD-10-CM | POA: Diagnosis present

## 2021-08-14 DIAGNOSIS — M48061 Spinal stenosis, lumbar region without neurogenic claudication: Secondary | ICD-10-CM | POA: Diagnosis not present

## 2021-08-14 DIAGNOSIS — R188 Other ascites: Secondary | ICD-10-CM | POA: Diagnosis not present

## 2021-08-14 DIAGNOSIS — R9431 Abnormal electrocardiogram [ECG] [EKG]: Secondary | ICD-10-CM | POA: Diagnosis not present

## 2021-08-14 DIAGNOSIS — Z794 Long term (current) use of insulin: Secondary | ICD-10-CM | POA: Insufficient documentation

## 2021-08-14 DIAGNOSIS — N3289 Other specified disorders of bladder: Secondary | ICD-10-CM | POA: Diagnosis not present

## 2021-08-14 DIAGNOSIS — D509 Iron deficiency anemia, unspecified: Secondary | ICD-10-CM | POA: Diagnosis not present

## 2021-08-14 DIAGNOSIS — D631 Anemia in chronic kidney disease: Secondary | ICD-10-CM | POA: Diagnosis not present

## 2021-08-14 DIAGNOSIS — N2581 Secondary hyperparathyroidism of renal origin: Secondary | ICD-10-CM | POA: Diagnosis not present

## 2021-08-14 LAB — CBC
HCT: 31.2 % — ABNORMAL LOW (ref 39.0–52.0)
Hemoglobin: 10.1 g/dL — ABNORMAL LOW (ref 13.0–17.0)
MCH: 30.1 pg (ref 26.0–34.0)
MCHC: 32.4 g/dL (ref 30.0–36.0)
MCV: 93.1 fL (ref 80.0–100.0)
Platelets: UNDETERMINED 10*3/uL (ref 150–400)
RBC: 3.35 MIL/uL — ABNORMAL LOW (ref 4.22–5.81)
RDW: 14.6 % (ref 11.5–15.5)
WBC: 6.2 10*3/uL (ref 4.0–10.5)
nRBC: 0 % (ref 0.0–0.2)

## 2021-08-14 LAB — COMPREHENSIVE METABOLIC PANEL
ALT: 21 U/L (ref 0–44)
AST: 18 U/L (ref 15–41)
Albumin: 2.7 g/dL — ABNORMAL LOW (ref 3.5–5.0)
Alkaline Phosphatase: 92 U/L (ref 38–126)
Anion gap: 16 — ABNORMAL HIGH (ref 5–15)
BUN: 50 mg/dL — ABNORMAL HIGH (ref 8–23)
CO2: 23 mmol/L (ref 22–32)
Calcium: 9.5 mg/dL (ref 8.9–10.3)
Chloride: 91 mmol/L — ABNORMAL LOW (ref 98–111)
Creatinine, Ser: 18.51 mg/dL — ABNORMAL HIGH (ref 0.61–1.24)
GFR, Estimated: 2 mL/min — ABNORMAL LOW (ref >60)
Glucose, Bld: 104 mg/dL — ABNORMAL HIGH (ref 70–99)
Potassium: 3.4 mmol/L — ABNORMAL LOW (ref 3.5–5.1)
Sodium: 130 mmol/L — ABNORMAL LOW (ref 135–145)
Total Bilirubin: 0.3 mg/dL (ref 0.3–1.2)
Total Protein: 6.6 g/dL (ref 6.5–8.1)

## 2021-08-14 LAB — RESP PANEL BY RT-PCR (FLU A&B, COVID) ARPGX2
Influenza A by PCR: NEGATIVE
Influenza B by PCR: NEGATIVE
SARS Coronavirus 2 by RT PCR: POSITIVE — AB

## 2021-08-14 LAB — LIPASE, BLOOD: Lipase: 199 U/L — ABNORMAL HIGH (ref 11–51)

## 2021-08-14 NOTE — ED Triage Notes (Addendum)
Pt presents to the ED from home with complaints of mid back pain radiating around to right flank onset 1 week ago. Pt also complaining of congestion and cough, since having heart attack, 07/22/21. Pt placed on brilinta on 07/22/21. Pt on peritoneal dialysis and states output fluid has been clear.

## 2021-08-15 ENCOUNTER — Emergency Department (HOSPITAL_COMMUNITY): Payer: Medicare Other

## 2021-08-15 DIAGNOSIS — I2510 Atherosclerotic heart disease of native coronary artery without angina pectoris: Secondary | ICD-10-CM | POA: Diagnosis not present

## 2021-08-15 DIAGNOSIS — M546 Pain in thoracic spine: Secondary | ICD-10-CM | POA: Diagnosis not present

## 2021-08-15 DIAGNOSIS — Z992 Dependence on renal dialysis: Secondary | ICD-10-CM | POA: Diagnosis not present

## 2021-08-15 DIAGNOSIS — N186 End stage renal disease: Secondary | ICD-10-CM | POA: Diagnosis not present

## 2021-08-15 DIAGNOSIS — N3289 Other specified disorders of bladder: Secondary | ICD-10-CM | POA: Diagnosis not present

## 2021-08-15 DIAGNOSIS — M545 Low back pain, unspecified: Secondary | ICD-10-CM | POA: Diagnosis not present

## 2021-08-15 DIAGNOSIS — N2581 Secondary hyperparathyroidism of renal origin: Secondary | ICD-10-CM | POA: Diagnosis not present

## 2021-08-15 DIAGNOSIS — D509 Iron deficiency anemia, unspecified: Secondary | ICD-10-CM | POA: Diagnosis not present

## 2021-08-15 DIAGNOSIS — I700 Atherosclerosis of aorta: Secondary | ICD-10-CM | POA: Diagnosis not present

## 2021-08-15 DIAGNOSIS — U071 COVID-19: Secondary | ICD-10-CM | POA: Diagnosis not present

## 2021-08-15 DIAGNOSIS — R188 Other ascites: Secondary | ICD-10-CM | POA: Diagnosis not present

## 2021-08-15 DIAGNOSIS — M48061 Spinal stenosis, lumbar region without neurogenic claudication: Secondary | ICD-10-CM | POA: Diagnosis not present

## 2021-08-15 DIAGNOSIS — D631 Anemia in chronic kidney disease: Secondary | ICD-10-CM | POA: Diagnosis not present

## 2021-08-15 DIAGNOSIS — N261 Atrophy of kidney (terminal): Secondary | ICD-10-CM | POA: Diagnosis not present

## 2021-08-15 MED ORDER — BENZONATATE 100 MG PO CAPS: 100.0000 mg | ORAL_CAPSULE | Freq: Three times a day (TID) | ORAL | 0 refills | Status: DC

## 2021-08-15 MED ORDER — HYDROCODONE-ACETAMINOPHEN 5-325 MG PO TABS: 2.0000 | ORAL_TABLET | ORAL | 0 refills | Status: DC | PRN

## 2021-08-15 MED ORDER — OXYCODONE-ACETAMINOPHEN 5-325 MG PO TABS
2.0000 | ORAL_TABLET | Freq: Once | ORAL | Status: AC
  Administered 2021-08-15: 2 via ORAL
  Filled 2021-08-15: qty 2

## 2021-08-15 NOTE — ED Provider Notes (Signed)
[facility] Provider Note   CSN: [id number] Arrival date & time: 08/14/21  2113     History  Chief Complaint  Patient presents with   Back Pain   Cough    [name] [name] is a 71 y.o. male with a history of diabetes mellitus type 2, ESRD on daily dialysis, CAD, fatty liver, HTN, and cardiac stent presenting today with mid back pain radiating across the right flank.  Pain described as sharp, with waxing and waning.  Pain worse with pressure, but not with light sensation.  This is accompanied with mild SOB and abdominal pain.  Also had congestion and productive cough with clear sputum that waxes and wanes since his heart cath on 07/27/2021.  Heat provided temporary relief.  Pain is worse when laying supine, with movements, or when using CPAP.  The CPAP causes the patient to "feel real full and swell up".  Denies nausea, vomiting, chills, fever, syncope.  07/29/2021 PCI with stent placement of the RCA.  The history is provided by the patient and medical records.  Back Pain Cough     Home Medications Prior to Admission medications   Medication Sig Start Date End Date Taking? Authorizing Provider  benzonatate (TESSALON) 100 MG capsule Take 1 capsule (100 mg total) by mouth every 8 (eight) hours. 08/15/21  Yes Prince Rome, PA-C  HYDROcodone-acetaminophen (NORCO/VICODIN) 5-325 MG tablet Take 2 tablets by mouth every 4 (four) hours as needed. 08/15/21  Yes Prince Rome, PA-C  albuterol (VENTOLIN HFA) 108 (90 Base) MCG/ACT inhaler Inhale 2 puffs into the lungs every 6 (six) hours as needed for wheezing or shortness of breath. 03/25/21   Rigoberto Noel, MD  aspirin EC 81 MG tablet Take 81 mg by mouth daily. Swallow whole.    [provider]  budesonide-formoterol (SYMBICORT) 160-4.5 MCG/ACT inhaler Inhale 2 puffs into the lungs 2 (two) times daily. 04/07/20   Collene Gobble, MD  calcitRIOL (ROCALTROL) 0.25 MCG capsule Take 0.25 mcg by  mouth daily.    [provider]  glucose blood test strip 1 each by Other route 2 (two) times daily. Use as instructed to check blood glucose twice daily 02/22/21   Brita Romp, NP  hydrocortisone (ANUSOL-HC) 2.5 % rectal cream Place 1 application rectally 2 (two) times daily. Patient not taking: Reported on 07/29/2021 07/13/21   Annitta Needs, NP  Insulin Pen Needle (DROPLET PEN NEEDLES) 31G X 5 MM MISC Use to inject insulin daily as directed 12/30/20   Cassandria Anger, MD  LANTUS SOLOSTAR 100 UNIT/ML Solostar Pen INJECT  30 UNITS SUBCUTANEOUSLY AT BEDTIME Patient taking differently: 30 Units at bedtime. 12/15/20   Brita Romp, NP  loperamide HCl (IMODIUM) 1 MG/7.5ML suspension Take 7.5 mLs (1 mg total) by mouth as needed for diarrhea or loose stools. Patient not taking: Reported on 07/29/2021 07/27/21   Hosie Poisson, MD  losartan (COZAAR) 50 MG tablet Take 50 mg by mouth daily. 05/14/21   [provider]  metoprolol succinate (TOPROL-XL) 25 MG 24 hr tablet Take 25 mg by mouth daily.    [provider]  pantoprazole (PROTONIX) 40 MG tablet Take 1 tablet (40 mg total) by mouth 2 (two) times daily before a meal. 07/25/21   Erenest Rasher, PA-C  simvastatin (ZOCOR) 20 MG tablet Take 20 mg by mouth every morning.    [provider]  ticagrelor (BRILINTA) 90 MG TABS tablet Take 1 tablet (90 mg  total) by mouth 2 (two) times daily. 07/27/21   Hosie Poisson, MD  torsemide (DEMADEX) 100 MG tablet Take 100 mg by mouth daily. 05/03/21   [provider]  TRADJENTA 5 MG TABS tablet TAKE 1 TABLET EVERY DAY 07/19/21   Brita Romp, NP  Vitamin D, Ergocalciferol, (DRISDOL) 1.25 MG (50000 UNIT) CAPS capsule Take 50,000 Units by mouth every 30 (thirty) days. 05/03/21   [provider]      Allergies    Opana [oxymorphone hcl], Oxymorphone, and Tramadol    Review of Systems   Review of Systems  Respiratory:  Positive for cough.    Musculoskeletal:  Positive for back pain.   Physical Exam Updated Vital Signs BP 110/70    Pulse 91    Temp 97.8 F (36.6 C)    Resp 20    Ht 5\' 4"  (1.626 m)    Wt 100.2 kg    SpO2 100%    BMI 37.93 kg/m  Physical Exam  ED Results / Procedures / Treatments   Labs (all labs ordered are listed, but only abnormal results are displayed) Labs Reviewed  RESP PANEL BY RT-PCR (FLU A&B, COVID) ARPGX2 - Abnormal; Notable for the following components:      Result Value   SARS Coronavirus 2 by RT PCR POSITIVE (*)    All other components within normal limits  COMPREHENSIVE METABOLIC PANEL - Abnormal; Notable for the following components:   Sodium 130 (*)    Potassium 3.4 (*)    Chloride 91 (*)    Glucose, Bld 104 (*)    BUN 50 (*)    Creatinine, Ser 18.51 (*)    Albumin 2.7 (*)    GFR, Estimated 2 (*)    Anion gap 16 (*)    All other components within normal limits  CBC - Abnormal; Notable for the following components:   RBC 3.35 (*)    Hemoglobin 10.1 (*)    HCT 31.2 (*)    All other components within normal limits  LIPASE, BLOOD - Abnormal; Notable for the following components:   Lipase 199 (*)    All other components within normal limits  URINALYSIS, ROUTINE W REFLEX MICROSCOPIC    EKG EKG Interpretation  Date/Time:  Monday August 15 2021 03:04:55 EST Ventricular Rate:  85 PR Interval:  172 QRS Duration: 131 QT Interval:  429 QTC Calculation: 511 R Axis:   -57 Text Interpretation: Sinus rhythm Sinus pause Probable left atrial enlargement Left bundle branch block Confirmed by Merrily Pew [id number] on 08/15/2021 5:27:31 AM  Radiology CT T-SPINE NO CHARGE  Result Date: 08/15/2021 CLINICAL DATA:  Mid back pain radiating around the right flank beginning 1 week ago. Cough and congestion EXAM: CT Thoracic and Lumbar spine without contrast TECHNIQUE: Multiplanar CT images of the thoracic and lumbar spine were reconstructed from contemporary CT of the Chest, Abdomen, and Pelvis.  RADIATION DOSE REDUCTION: This exam was performed according to the departmental dose-optimization program which includes automated exposure control, adjustment of the mA and/or kV according to patient size and/or use of iterative reconstruction technique. CONTRAST:  None COMPARISON:  None similar FINDINGS: Thinner slice source images are also reviewed for increased sensitivity. CT THORACIC SPINE FINDINGS Alignment: Normal in the thoracic spine Vertebrae: No evidence of fracture or bone lesion. Paraspinal and other soft tissues: Reported separately. No perispinal inflammation or masslike finding Disc levels: Diffusely preserved disc height for age with only mild scattered endplate spurs. There is more notable disc narrowing  and endplate/facet spurring at C6-7 and C7-T1, with mild C7-T1 anterolisthesis. No visible thoracic herniation or impingement to explain symptoms CT LUMBAR SPINE FINDINGS Segmentation: 5 lumbar type vertebrae based on the lowest ribs Alignment: Normal Vertebrae: No acute fracture or focal pathologic process. Paraspinal and other soft tissues: No perispinal mass or inflammation noted. Intra-abdominal findings described on dedicated source images Disc levels: Generalized disc bulging greatest at L3-4 and L4-5. Degenerative facet spurring at L4-5 and L5-S1 especially. High-grade spinal stenosis at L4-5. Short pedicles contribute to the narrowing at this level and at the adjacent levels. Foraminal narrowing asymmetric to the left at L3-4 and below. No focal right-sided foraminal impingement noted. IMPRESSION: 1. No emergent finding in the thoracic or lumbar spine. No focal or advanced right-sided foraminal impingement in the thoracic spine. 2. Lower lumbar degeneration with high-grade L4-5 spinal stenosis. Left more than right foraminal narrowing at L3-4 and below. Electronically Signed   By: Jorje Guild M.D.   On: 08/15/2021 05:06   CT L-SPINE NO CHARGE  Result Date: 08/15/2021 CLINICAL DATA:   Mid back pain radiating around the right flank beginning 1 week ago. Cough and congestion EXAM: CT Thoracic and Lumbar spine without contrast TECHNIQUE: Multiplanar CT images of the thoracic and lumbar spine were reconstructed from contemporary CT of the Chest, Abdomen, and Pelvis. RADIATION DOSE REDUCTION: This exam was performed according to the departmental dose-optimization program which includes automated exposure control, adjustment of the mA and/or kV according to patient size and/or use of iterative reconstruction technique. CONTRAST:  None COMPARISON:  None similar FINDINGS: Thinner slice source images are also reviewed for increased sensitivity. CT THORACIC SPINE FINDINGS Alignment: Normal in the thoracic spine Vertebrae: No evidence of fracture or bone lesion. Paraspinal and other soft tissues: Reported separately. No perispinal inflammation or masslike finding Disc levels: Diffusely preserved disc height for age with only mild scattered endplate spurs. There is more notable disc narrowing and endplate/facet spurring at C6-7 and C7-T1, with mild C7-T1 anterolisthesis. No visible thoracic herniation or impingement to explain symptoms CT LUMBAR SPINE FINDINGS Segmentation: 5 lumbar type vertebrae based on the lowest ribs Alignment: Normal Vertebrae: No acute fracture or focal pathologic process. Paraspinal and other soft tissues: No perispinal mass or inflammation noted. Intra-abdominal findings described on dedicated source images Disc levels: Generalized disc bulging greatest at L3-4 and L4-5. Degenerative facet spurring at L4-5 and L5-S1 especially. High-grade spinal stenosis at L4-5. Short pedicles contribute to the narrowing at this level and at the adjacent levels. Foraminal narrowing asymmetric to the left at L3-4 and below. No focal right-sided foraminal impingement noted. IMPRESSION: 1. No emergent finding in the thoracic or lumbar spine. No focal or advanced right-sided foraminal impingement in  the thoracic spine. 2. Lower lumbar degeneration with high-grade L4-5 spinal stenosis. Left more than right foraminal narrowing at L3-4 and below. Electronically Signed   By: Jorje Guild M.D.   On: 08/15/2021 05:06   CT CHEST ABDOMEN PELVIS WO CONTRAST  Result Date: 08/15/2021 CLINICAL DATA:  Flank pain. Mid back pain radiating around the right beginning 1 week ago. Evaluate for kidney stone EXAM: CT CHEST, ABDOMEN AND PELVIS WITHOUT CONTRAST TECHNIQUE: Multidetector CT imaging of the chest, abdomen and pelvis was performed following the standard protocol without IV contrast. RADIATION DOSE REDUCTION: This exam was performed according to the departmental dose-optimization program which includes automated exposure control, adjustment of the mA and/or kV according to patient size and/or use of iterative reconstruction technique. COMPARISON:  Abdominal CT 09/24/2019  FINDINGS: CT CHEST FINDINGS Cardiovascular: Normal heart size. No pericardial effusion. Extensive atheromatous calcification of the aorta and coronaries. Mediastinum/Nodes: Negative for adenopathy or mass Lungs/Pleura: There is no edema, consolidation, effusion, or pneumothorax. Musculoskeletal: Negative for fracture or destructive process. Dedicated thoracic spine imaging with no focal or definite right-sided impingement. CT ABDOMEN PELVIS FINDINGS Hepatobiliary: No focal liver abnormality.No evidence of biliary obstruction or stone. Pancreas: Unremarkable. Spleen: Unremarkable. Adrenals/Urinary Tract: Negative adrenals. Symmetrically atrophic kidneys with collapsed urinary bladder. No hydronephrosis or stone Stomach/Bowel: No obstruction. No visible bowel inflammation including appendicitis. Scattered colonic diverticula without focal inflammation. Vascular/Lymphatic: No acute vascular abnormality. Diffuse atheromatous calcification. Reproductive:Dystrophic prostate calcification, incidental. There is a penile prosthesis with stable flat appearing  reservoir in the right lower quadrant. Other: Moderate peritoneal fluid in the setting of peritoneal dialysis, mainly dependent. Dialysis catheter is in the lower left pericolic gutter. Scattered areas of mesenteric reticulation attributed to the dialysate fluid, not seen previously. Musculoskeletal: No acute finding.  Lumbar spine degeneration. IMPRESSION: 1. No definite cause for symptoms in the chest or abdomen. 2. Peritoneal dialysis. Scattered areas of mesenteric fat reticulation and moderate retention of fluid, are there any clinical signs of peritoneal inflammation. 3. Extensive atherosclerosis including the coronary arteries. Electronically Signed   By: Jorje Guild M.D.   On: 08/15/2021 05:13    Procedures Procedures    Medications Ordered in ED Medications  oxyCODONE-acetaminophen (PERCOCET/ROXICET) 5-325 MG per tablet 2 tablet (2 tablets Oral Given 08/15/21 0327)    ED Course/ Medical Decision Making/ A&P Clinical Course as of 08/15/21 0704  Mon Aug 15, 2021  0241 Lipase, blood(!) [JM]  4193 Lipase(!): 199 [JM]    Clinical Course User Index [JM] Mesner, Corene Cornea, MD                           Medical Decision Making Amount and/or Complexity of Data Reviewed Labs: ordered.   71 year old male presents to the ED for concern of mid back pain radiating to the right flank.  This involves an extensive number of treatment options, and is a complaint that carries with it a high risk of complications and morbidity.  The differential diagnosis includes spinal fracture, rib fracture, pancreatitis, pneumonia, herpes zoster, renal calculi, pneumothorax, PE, epidural abscess  Comorbidities that complicate the patient evaluation include end-stage renal disease on dialysis, diabetes mellitus type 2, CAD, chronic HTN, fatty liver disease, cardiac stent placement  Additional history obtained from patient's wife and internal/external records available via epic  I ordered, and personally  interpreted labs.  The pertinent results include: CBC showing mild anemia with a hemoglobin of 10.1 and an HCT of 31.2, but this is an improvement considering past labs over the last few months.  Potassium slightly low at 3.4.  Creatinine significantly elevated at 18.51 and BUN elevated at 50.  Glucose only has minor elevation at 104.  COVID was positive and influenza was negative, which matches his test results from visit 3 weeks ago.  Lipase minor elevation at 199, which is likely due to retention as patient is on dialysis.  I ordered imaging studies including CT of the chest and abdomen, lumbar spine, and thoracic spine.  I independently visualized and interpreted imaging which showed no acute pathological fracture or acute abdominal pathology.  I agree with the radiologist interpretation  The patient was maintained on a cardiac monitor.  I personally viewed and interpreted the cardiac monitored which showed an underlying rhythm of: heart rate  under 100 and a left bundle branch block which is consistent with previous rhythm interpretations  I ordered medication including Percocet for pain management.  Reevaluation of the patient after these medicines showed that the patient improved, but still had some lingering pain  .I have reviewed the patients home medicines and have made adjustments as needed  I considered ordering an MRI of the spine to assess for an abscess, but based on patient presentation vital signs, physical exam, and lab work, it is unlikely that he has a spinal abscess at this time.  ED Course: Imaging not supportive of rib fracture or spinal fracture, nor does it suggest a pneumothorax.  Lab work and presentation are not for suggestive of pancreatitis.  No evidence of pneumonia on imaging.  No rash or dermatological findings to suggest shingles, but could be early presentation. Physical exam and patient history are not supportive of renal calculi or pyelonephritis.  No abscess unlikely  as well as patient is nontachycardic, afebrile, and well-appearing.  Will provide Tessalon to help relieve cough and a few days of Norco for pain relief to hold him over until he can follow-up with his primary care.  Encourage patient to continue their at home dialysis treatments as usual.  Dispostion: I discussed the patient and their case with my attending, Dr. Dayna Barker, who agreed with the proposed treatment course.  After consideration of the diagnostic results and the patients response to treatment, I feel that the patent would benefit from discharge home and follow-up with primary care outpatient.  Overall, I am uncertain the exact etiology of the patient's symptoms.  However, I do not believe he is currently experiencing a medical, surgical, or psychiatric emergency.  Provided pain management and antitussive upon discharge.  Discussed course of treatment thoroughly with the patient and his wife and they demonstrated understanding.  Patient in agreement and has no further questions.        Final Clinical Impression(s) / ED Diagnoses Final diagnoses:  Mid back pain  COVID-19    Rx / DC Orders ED Discharge Orders          Ordered    benzonatate (TESSALON) 100 MG capsule  Every 8 hours        08/15/21 0647    HYDROcodone-acetaminophen (NORCO/VICODIN) 5-325 MG tablet  Every 4 hours PRN        08/15/21 0647              Prince Rome, PA-C 73/71/06 2694    Merrily Pew, MD 08/15/21 [phone]

## 2021-08-15 NOTE — Discharge Instructions (Addendum)
You have been provided pain medication by the name of Norco, take 1-2 every 8 hours for pain.  You have also been provided Tessalon, to be used taken every 8 hours for cough.  If you develop itching or rash with either these medications, stop taking them immediately.  Follow-up with primary care for continued medical management and reassessment.

## 2021-08-16 DIAGNOSIS — N186 End stage renal disease: Secondary | ICD-10-CM | POA: Diagnosis not present

## 2021-08-16 DIAGNOSIS — Z992 Dependence on renal dialysis: Secondary | ICD-10-CM | POA: Diagnosis not present

## 2021-08-16 DIAGNOSIS — D631 Anemia in chronic kidney disease: Secondary | ICD-10-CM | POA: Diagnosis not present

## 2021-08-16 DIAGNOSIS — N2581 Secondary hyperparathyroidism of renal origin: Secondary | ICD-10-CM | POA: Diagnosis not present

## 2021-08-16 DIAGNOSIS — D509 Iron deficiency anemia, unspecified: Secondary | ICD-10-CM | POA: Diagnosis not present

## 2021-08-17 DIAGNOSIS — N186 End stage renal disease: Secondary | ICD-10-CM | POA: Diagnosis not present

## 2021-08-17 DIAGNOSIS — N2581 Secondary hyperparathyroidism of renal origin: Secondary | ICD-10-CM | POA: Diagnosis not present

## 2021-08-17 DIAGNOSIS — D631 Anemia in chronic kidney disease: Secondary | ICD-10-CM | POA: Diagnosis not present

## 2021-08-17 DIAGNOSIS — Z992 Dependence on renal dialysis: Secondary | ICD-10-CM | POA: Diagnosis not present

## 2021-08-17 DIAGNOSIS — D509 Iron deficiency anemia, unspecified: Secondary | ICD-10-CM | POA: Diagnosis not present

## 2021-08-18 DIAGNOSIS — Z1159 Encounter for screening for other viral diseases: Secondary | ICD-10-CM | POA: Diagnosis not present

## 2021-08-18 DIAGNOSIS — Z992 Dependence on renal dialysis: Secondary | ICD-10-CM | POA: Diagnosis not present

## 2021-08-18 DIAGNOSIS — N2581 Secondary hyperparathyroidism of renal origin: Secondary | ICD-10-CM | POA: Diagnosis not present

## 2021-08-18 DIAGNOSIS — D631 Anemia in chronic kidney disease: Secondary | ICD-10-CM | POA: Diagnosis not present

## 2021-08-18 DIAGNOSIS — N186 End stage renal disease: Secondary | ICD-10-CM | POA: Diagnosis not present

## 2021-08-18 DIAGNOSIS — D509 Iron deficiency anemia, unspecified: Secondary | ICD-10-CM | POA: Diagnosis not present

## 2021-08-19 DIAGNOSIS — D631 Anemia in chronic kidney disease: Secondary | ICD-10-CM | POA: Diagnosis not present

## 2021-08-19 DIAGNOSIS — D509 Iron deficiency anemia, unspecified: Secondary | ICD-10-CM | POA: Diagnosis not present

## 2021-08-19 DIAGNOSIS — N2581 Secondary hyperparathyroidism of renal origin: Secondary | ICD-10-CM | POA: Diagnosis not present

## 2021-08-19 DIAGNOSIS — Z992 Dependence on renal dialysis: Secondary | ICD-10-CM | POA: Diagnosis not present

## 2021-08-19 DIAGNOSIS — N186 End stage renal disease: Secondary | ICD-10-CM | POA: Diagnosis not present

## 2021-08-20 DIAGNOSIS — D631 Anemia in chronic kidney disease: Secondary | ICD-10-CM | POA: Diagnosis not present

## 2021-08-20 DIAGNOSIS — N186 End stage renal disease: Secondary | ICD-10-CM | POA: Diagnosis not present

## 2021-08-20 DIAGNOSIS — Z992 Dependence on renal dialysis: Secondary | ICD-10-CM | POA: Diagnosis not present

## 2021-08-20 DIAGNOSIS — D509 Iron deficiency anemia, unspecified: Secondary | ICD-10-CM | POA: Diagnosis not present

## 2021-08-20 DIAGNOSIS — N2581 Secondary hyperparathyroidism of renal origin: Secondary | ICD-10-CM | POA: Diagnosis not present

## 2021-08-21 DIAGNOSIS — D509 Iron deficiency anemia, unspecified: Secondary | ICD-10-CM | POA: Diagnosis not present

## 2021-08-21 DIAGNOSIS — N2581 Secondary hyperparathyroidism of renal origin: Secondary | ICD-10-CM | POA: Diagnosis not present

## 2021-08-21 DIAGNOSIS — D631 Anemia in chronic kidney disease: Secondary | ICD-10-CM | POA: Diagnosis not present

## 2021-08-21 DIAGNOSIS — Z992 Dependence on renal dialysis: Secondary | ICD-10-CM | POA: Diagnosis not present

## 2021-08-21 DIAGNOSIS — N186 End stage renal disease: Secondary | ICD-10-CM | POA: Diagnosis not present

## 2021-08-22 DIAGNOSIS — N2581 Secondary hyperparathyroidism of renal origin: Secondary | ICD-10-CM | POA: Diagnosis not present

## 2021-08-22 DIAGNOSIS — N186 End stage renal disease: Secondary | ICD-10-CM | POA: Diagnosis not present

## 2021-08-22 DIAGNOSIS — D631 Anemia in chronic kidney disease: Secondary | ICD-10-CM | POA: Diagnosis not present

## 2021-08-22 DIAGNOSIS — Z992 Dependence on renal dialysis: Secondary | ICD-10-CM | POA: Diagnosis not present

## 2021-08-22 DIAGNOSIS — D509 Iron deficiency anemia, unspecified: Secondary | ICD-10-CM | POA: Diagnosis not present

## 2021-08-23 DIAGNOSIS — D509 Iron deficiency anemia, unspecified: Secondary | ICD-10-CM | POA: Diagnosis not present

## 2021-08-23 DIAGNOSIS — N2581 Secondary hyperparathyroidism of renal origin: Secondary | ICD-10-CM | POA: Diagnosis not present

## 2021-08-23 DIAGNOSIS — Z992 Dependence on renal dialysis: Secondary | ICD-10-CM | POA: Diagnosis not present

## 2021-08-23 DIAGNOSIS — D631 Anemia in chronic kidney disease: Secondary | ICD-10-CM | POA: Diagnosis not present

## 2021-08-23 DIAGNOSIS — N186 End stage renal disease: Secondary | ICD-10-CM | POA: Diagnosis not present

## 2021-08-24 DIAGNOSIS — N2581 Secondary hyperparathyroidism of renal origin: Secondary | ICD-10-CM | POA: Diagnosis not present

## 2021-08-24 DIAGNOSIS — D509 Iron deficiency anemia, unspecified: Secondary | ICD-10-CM | POA: Diagnosis not present

## 2021-08-24 DIAGNOSIS — N186 End stage renal disease: Secondary | ICD-10-CM | POA: Diagnosis not present

## 2021-08-24 DIAGNOSIS — Z992 Dependence on renal dialysis: Secondary | ICD-10-CM | POA: Diagnosis not present

## 2021-08-24 DIAGNOSIS — D631 Anemia in chronic kidney disease: Secondary | ICD-10-CM | POA: Diagnosis not present

## 2021-08-25 DIAGNOSIS — D631 Anemia in chronic kidney disease: Secondary | ICD-10-CM | POA: Diagnosis not present

## 2021-08-25 DIAGNOSIS — N2581 Secondary hyperparathyroidism of renal origin: Secondary | ICD-10-CM | POA: Diagnosis not present

## 2021-08-25 DIAGNOSIS — Z992 Dependence on renal dialysis: Secondary | ICD-10-CM | POA: Diagnosis not present

## 2021-08-25 DIAGNOSIS — D509 Iron deficiency anemia, unspecified: Secondary | ICD-10-CM | POA: Diagnosis not present

## 2021-08-25 DIAGNOSIS — N186 End stage renal disease: Secondary | ICD-10-CM | POA: Diagnosis not present

## 2021-08-26 DIAGNOSIS — N186 End stage renal disease: Secondary | ICD-10-CM | POA: Diagnosis not present

## 2021-08-26 DIAGNOSIS — N2581 Secondary hyperparathyroidism of renal origin: Secondary | ICD-10-CM | POA: Diagnosis not present

## 2021-08-26 DIAGNOSIS — D631 Anemia in chronic kidney disease: Secondary | ICD-10-CM | POA: Diagnosis not present

## 2021-08-26 DIAGNOSIS — D509 Iron deficiency anemia, unspecified: Secondary | ICD-10-CM | POA: Diagnosis not present

## 2021-08-26 DIAGNOSIS — Z992 Dependence on renal dialysis: Secondary | ICD-10-CM | POA: Diagnosis not present

## 2021-08-27 DIAGNOSIS — N186 End stage renal disease: Secondary | ICD-10-CM | POA: Diagnosis not present

## 2021-08-27 DIAGNOSIS — Z992 Dependence on renal dialysis: Secondary | ICD-10-CM | POA: Diagnosis not present

## 2021-08-27 DIAGNOSIS — D631 Anemia in chronic kidney disease: Secondary | ICD-10-CM | POA: Diagnosis not present

## 2021-08-27 DIAGNOSIS — D509 Iron deficiency anemia, unspecified: Secondary | ICD-10-CM | POA: Diagnosis not present

## 2021-08-27 DIAGNOSIS — N2581 Secondary hyperparathyroidism of renal origin: Secondary | ICD-10-CM | POA: Diagnosis not present

## 2021-08-28 DIAGNOSIS — N186 End stage renal disease: Secondary | ICD-10-CM | POA: Diagnosis not present

## 2021-08-28 DIAGNOSIS — D509 Iron deficiency anemia, unspecified: Secondary | ICD-10-CM | POA: Diagnosis not present

## 2021-08-28 DIAGNOSIS — N2581 Secondary hyperparathyroidism of renal origin: Secondary | ICD-10-CM | POA: Diagnosis not present

## 2021-08-28 DIAGNOSIS — D631 Anemia in chronic kidney disease: Secondary | ICD-10-CM | POA: Diagnosis not present

## 2021-08-28 DIAGNOSIS — Z992 Dependence on renal dialysis: Secondary | ICD-10-CM | POA: Diagnosis not present

## 2021-08-29 DIAGNOSIS — N2581 Secondary hyperparathyroidism of renal origin: Secondary | ICD-10-CM | POA: Diagnosis not present

## 2021-08-29 DIAGNOSIS — Z992 Dependence on renal dialysis: Secondary | ICD-10-CM | POA: Diagnosis not present

## 2021-08-29 DIAGNOSIS — D631 Anemia in chronic kidney disease: Secondary | ICD-10-CM | POA: Diagnosis not present

## 2021-08-29 DIAGNOSIS — D509 Iron deficiency anemia, unspecified: Secondary | ICD-10-CM | POA: Diagnosis not present

## 2021-08-29 DIAGNOSIS — N186 End stage renal disease: Secondary | ICD-10-CM | POA: Diagnosis not present

## 2021-08-30 DIAGNOSIS — Z992 Dependence on renal dialysis: Secondary | ICD-10-CM | POA: Diagnosis not present

## 2021-08-30 DIAGNOSIS — D509 Iron deficiency anemia, unspecified: Secondary | ICD-10-CM | POA: Diagnosis not present

## 2021-08-30 DIAGNOSIS — N186 End stage renal disease: Secondary | ICD-10-CM | POA: Diagnosis not present

## 2021-08-30 DIAGNOSIS — D631 Anemia in chronic kidney disease: Secondary | ICD-10-CM | POA: Diagnosis not present

## 2021-08-30 DIAGNOSIS — N2581 Secondary hyperparathyroidism of renal origin: Secondary | ICD-10-CM | POA: Diagnosis not present

## 2021-08-31 DIAGNOSIS — N2581 Secondary hyperparathyroidism of renal origin: Secondary | ICD-10-CM | POA: Diagnosis not present

## 2021-08-31 DIAGNOSIS — N186 End stage renal disease: Secondary | ICD-10-CM | POA: Diagnosis not present

## 2021-08-31 DIAGNOSIS — D631 Anemia in chronic kidney disease: Secondary | ICD-10-CM | POA: Diagnosis not present

## 2021-08-31 DIAGNOSIS — D509 Iron deficiency anemia, unspecified: Secondary | ICD-10-CM | POA: Diagnosis not present

## 2021-08-31 DIAGNOSIS — Z992 Dependence on renal dialysis: Secondary | ICD-10-CM | POA: Diagnosis not present

## 2021-09-01 ENCOUNTER — Ambulatory Visit (INDEPENDENT_AMBULATORY_CARE_PROVIDER_SITE_OTHER): Payer: Medicare Other | Admitting: Nurse Practitioner

## 2021-09-01 ENCOUNTER — Encounter (HOSPITAL_BASED_OUTPATIENT_CLINIC_OR_DEPARTMENT_OTHER): Payer: Self-pay | Admitting: Nurse Practitioner

## 2021-09-01 ENCOUNTER — Other Ambulatory Visit: Payer: Self-pay

## 2021-09-01 VITALS — BP 138/78 | HR 82 | Ht 64.0 in | Wt 229.1 lb

## 2021-09-01 DIAGNOSIS — N186 End stage renal disease: Secondary | ICD-10-CM | POA: Diagnosis not present

## 2021-09-01 DIAGNOSIS — I2510 Atherosclerotic heart disease of native coronary artery without angina pectoris: Secondary | ICD-10-CM

## 2021-09-01 DIAGNOSIS — N2581 Secondary hyperparathyroidism of renal origin: Secondary | ICD-10-CM | POA: Diagnosis not present

## 2021-09-01 DIAGNOSIS — I2111 ST elevation (STEMI) myocardial infarction involving right coronary artery: Secondary | ICD-10-CM

## 2021-09-01 DIAGNOSIS — D631 Anemia in chronic kidney disease: Secondary | ICD-10-CM | POA: Diagnosis not present

## 2021-09-01 DIAGNOSIS — I10 Essential (primary) hypertension: Secondary | ICD-10-CM | POA: Diagnosis not present

## 2021-09-01 DIAGNOSIS — Z992 Dependence on renal dialysis: Secondary | ICD-10-CM | POA: Diagnosis not present

## 2021-09-01 DIAGNOSIS — I5032 Chronic diastolic (congestive) heart failure: Secondary | ICD-10-CM

## 2021-09-01 DIAGNOSIS — D509 Iron deficiency anemia, unspecified: Secondary | ICD-10-CM | POA: Diagnosis not present

## 2021-09-01 DIAGNOSIS — E785 Hyperlipidemia, unspecified: Secondary | ICD-10-CM

## 2021-09-01 MED ORDER — LOSARTAN POTASSIUM 25 MG PO TABS: 25.0000 mg | ORAL_TABLET | Freq: Every day | ORAL | 6 refills | Status: DC

## 2021-09-01 NOTE — Patient Instructions (Signed)
Medication Instructions:  Your physician has recommended you make the following change in your medication:   Start: Losartan 25mg  only when your blood pressure is greater than 140/80  *If you need a refill on your cardiac medications before your next appointment, please call your pharmacy*   Lab Work: Please return for Lab work 1 week before your next appointment. Please be fasting for a Lipid Panel!  You may come to the...   [facility] (3rd floor) [address] [place], [place] [address]  Open: 8am-Noon and 1pm-4:30pm   [facility] [place]- Any location  **no appointments needed**  If you have labs (blood work) drawn today and your tests are completely normal, you will receive your results only by: [employer] (if you have MyChart) OR A paper copy in the mail If you have any lab test that is abnormal or we need to change your treatment, we will call you to review the results.   Testing/Procedures: None ordered today    Follow-Up: At [facility], you and your health needs are our priority.  As part of our continuing mission to provide you with exceptional heart care, we have created designated Provider Care Teams.  These Care Teams include your primary Cardiologist (physician) and Advanced Practice Providers (APPs -  Physician Assistants and Nurse Practitioners) who all work together to provide you with the care you need, when you need it.  We recommend signing up for the patient portal called "MyChart".  Sign up information is provided on this After Visit Summary.  MyChart is used to connect with patients for Virtual Visits (Telemedicine).  Patients are able to view lab/test results, encounter notes, upcoming appointments, etc.  Non-urgent messages can be sent to your provider as well.   To learn more about what you can do with MyChart, go to [email or web address].    Your next appointment:    Dr. Debara Pickett at the [facility] on May 18th at 1pm

## 2021-09-01 NOTE — Progress Notes (Addendum)
Cardiology Office Note:    Date:  09/05/2021   ID:  [name] [name], [name] [name] 1951-01-13, MRN [id number]  PCP:  Carrolyn Meiers, MD   [facility] Providers Cardiologist:  Pixie Casino, MD     Referring MD: Carrolyn Meiers*   Chief Complaint:   History of Present Illness:    [name] [name] is a 71 y.o. male with a hx of HTN, CAD s/p MI, DM, OSA, ESRD on home peritoneal dialysis, and hyperlipidemia.   He is a former patient of Dr. Rollene Fare and had a stress test in 2014.  This demonstrated reversible inferoapical ischemia was read as moderate risk. He underwent cardiac cath by Dr. Ellyn Hack on 03/28/2013 which demonstrated a small, possibly minimal aneurysmal bleb in the LAD but no significant disease.  Dominant RCA with no disease, no significant obstructive coronary disease.  At the time his EKG did show inferior T wave abnormalities and very small R waves but no evidence for Q waves.   He has followed routinely with our practice. Myoview 2/20 low risk study.   He was last seen in our office by Dr. Debara Pickett on 03/01/21, no changes to treatment plan were made, and he was advised to follow-up in 1 year.    He called our office on 07/22/2021 with concerns for chest pain and diaphoresis while loading wood and was advised to go to the ED. At [facility] ED EKG showed SR, 2nd degree AV block Mobitz type I, rate 78 with repolarization abnormalities, negative for signs of ischemia. Hs troponin was elevated at > 3000 x 2. Cardiology was consulted and patient was transferred to [facility] for cardiac catheterization.  Cath revealed severe complex RCA stenosis with severe disease in the proximal mid and distal vessel, treated successfully with PTCA and DES of the proximal lesion and PTCA alone of the mid and distal lesions.  Mid and distal RCA lesions had appearance of calcific and thrombotic disease, both improved with PTCA.  Mild to moderate nonobstructive calcific plaquing in the proximal LAD with no severe  stenosis throughout the LAD distribution.  Mild to moderate calcific stenosis of the first OM branch of the circumflex, with no high-grade stenosis throughout the circumflex distribution.  DAPT x 12 months recommended with aspirin and Brilinta. Echo with LVEF 55-60%, mild MVR, severely increased left ventricular wall thickness, pseudonormalization of diastolic function. Moderately dilated LA and RA. He tested positive for COVID-19. CXR with no evidence of cardiopulmonary disease. He was discharged on 07/27/21 but unfortunately returned on 07/29/2021 for hypotension.  Losartan was discontinued.   Today, he is here with his wife and reports he continues to have shortness of breath since hospital discharge.  Recent history of COVID-19 infection and COPD.  Has follow-up with pulmonology scheduled for April, called for sooner appointment and is on wait list. He is weighing at home and wanted to know his discharge weight which was 222 pounds.  Always checks blood pressure prior to initiating peritoneal dialysis and reports it is labile. He denies chest pain, lower extremity edema, fatigue, palpitations, melena, hematuria, hemoptysis, diaphoresis, weakness, presyncope, syncope, orthopnea, and PND. Denies problems with medications.  Sees his nephrologist once per month.    Past Medical History:  Diagnosis Date   Anemia    Arthritis    Asthma    Bell palsy    CAD (coronary artery disease)    a. 2014 MV: abnl w/ infap ischemia; b. 03/2013 Cath: aneurysmal bleb in the LAD w/ otw nonobs  dzs-->Med Rx.   Chronic back pain    Chronic knee pain    a. 09/2015 s/p R TKA.   Chronic pain    Chronic shoulder pain    Chronic sinusitis    CKD (chronic kidney disease), stage IV ([place])    stage 4 per office visit note of Dr Lowanda Foster on 05/2015    COPD (chronic obstructive pulmonary disease) ([place])    Diabetes mellitus without complication ([place])    type II    Essential hypertension    GERD (gastroesophageal reflux disease)     Gout    Gout    Hepatomegaly    noted on noncontrast CT 2015   History of hiatal hernia    Hyperlipidemia    Lateral meniscus tear    Obesity    Truncal   Obstructive sleep apnea    does not use cpap    On home oxygen therapy    uses 2l when is going somewhere per patient    PUD (peptic ulcer disease)    remote, reports f/u EGD about 8 years ago unremarkable    Reactive airway disease    related to exposure to chemical during 9/11   Renal insufficiency    Sinusitis    Vitamin D deficiency     Past Surgical History:  Procedure Laterality Date   ASAD LT SHOULDER  12/15/2008   left shoulder   AV FISTULA PLACEMENT Left 08/09/2016   Procedure: BRACHIOCEPHALIC ARTERIOVENOUS (AV) FISTULA CREATION LEFT ARM;  Surgeon: [name], MD;  Location: [facility];  Service: Vascular;  Laterality: Left;   CAPD INSERTION N/A 10/07/2018   Procedure: LAPAROSCOPIC PERITONEAL CATHETER PLACEMENT;  Surgeon: Mickeal Skinner, MD;  [facility];  Service: General;  Laterality: N/A;   CATARACT EXTRACTION W/PHACO Left 03/28/2016   Procedure: CATARACT EXTRACTION PHACO AND INTRAOCULAR LENS PLACEMENT LEFT EYE;  Surgeon: [name], MD;  [facility];  Service: Ophthalmology;  Laterality: Left;  CDE: 4.77   CATARACT EXTRACTION W/PHACO Right 04/11/2016   Procedure: CATARACT EXTRACTION PHACO AND INTRAOCULAR LENS PLACEMENT RIGHT EYE; CDE:  4.74;  Surgeon: [name], MD;  [facility];  Service: Ophthalmology;  Laterality: Right;   COLONOSCOPY  10/15/2008   Fields: Rectal polyp obliterated, not retrieved, hemorrhoids, single ascending colon diverticulum near the CV. Next colonoscopy April 2020   COLONOSCOPY N/A 12/25/2014   SLF: 1. Colorectal polyps (2) removed 2. Small internal hemorrhoids 3. the left colon is severely redundant. hyperplastic polyps   CORONARY STENT INTERVENTION N/A 07/25/2021   Procedure: CORONARY STENT INTERVENTION;  Surgeon: Sherren Mocha, MD;  Location: [facility];  Service: Cardiovascular;  Laterality: N/A;   DOPPLER ECHOCARDIOGRAPHY     ESOPHAGOGASTRODUODENOSCOPY N/A 12/25/2014   SLF: 1. Anemia most likely due to CRI, gastritis, gastric polyps 2. Moderate non-erosive gastriits and mild duodenitis.  3.TWo large gstric polyps removed.    EYE SURGERY  12/22/2010   tear duct probing-[place]   FOREIGN BODY REMOVAL  03/29/2011   Procedure: REMOVAL FOREIGN BODY EXTREMITY;  Surgeon: Arther Abbott, MD;  [facility];  Service: Orthopedics;  Laterality: Right;  Removal Foreign Body Right Thumb   IR FLUORO GUIDE CV LINE RIGHT  08/06/2018   IR US GUIDE VASC ACCESS RIGHT  08/06/2018   KNEE ARTHROSCOPY  10/16/2007   left   KNEE ARTHROSCOPY WITH LATERAL MENISECTOMY Right 10/14/2015   Procedure: LEFT KNEE ARTHROSCOPY WITH PARTIAL LATERAL MENISECTOMY;  Surgeon: Carole Civil, MD;  [facility];  Service: Orthopedics;  Laterality: Right;   LEFT HEART CATH AND CORONARY ANGIOGRAPHY N/A 07/25/2021   Procedure: LEFT HEART CATH AND CORONARY ANGIOGRAPHY;  Surgeon: Sherren Mocha, MD;  Location: [facility];  Service: Cardiovascular;  Laterality: N/A;   LEFT HEART CATHETERIZATION WITH CORONARY ANGIOGRAM N/A 03/28/2013   Procedure: LEFT HEART CATHETERIZATION WITH CORONARY ANGIOGRAM;  Surgeon: Leonie Man, MD;  Location: [facility] C[facility];  Service: Cardiovascular;  Laterality: N/A;   NM MYOVIEW LTD     PENILE PROSTHESIS IMPLANT N/A 08/16/2015   Procedure: PENILE PROTHESIS INFLATABLE, three piece, Excisional biopsy of Penile ulcer, Penile molding;  Surgeon: Carolan Clines, MD;  [facility];  Service: Urology;  Laterality: N/A;   PENILE PROSTHESIS IMPLANT N/A 12/24/2017   Procedure: REMOVAL AND  REPLACEMENT  COLOPLAST PENILE PROSTHESIS;  Surgeon: Lucas Mallow, MD;  [facility];  Service: Urology;  Laterality: N/A;   QUADRICEPS TENDON REPAIR  07/21/2011   Procedure: REPAIR QUADRICEP TENDON;  Surgeon: Arther Abbott, MD;   [facility];  Service: Orthopedics;  Laterality: Right;   TOENAIL EXCISION     removed [id number]   UMBILICAL HERNIA REPAIR  07/17/2005   [place]    Current Medications: Current Meds  Medication Sig   albuterol (VENTOLIN HFA) 108 (90 Base) MCG/ACT inhaler Inhale 2 puffs into the lungs every 6 (six) hours as needed for wheezing or shortness of breath.   aspirin EC 81 MG tablet Take 81 mg by mouth daily. Swallow whole.   benzonatate (TESSALON) 100 MG capsule Take 1 capsule (100 mg total) by mouth every 8 (eight) hours.   budesonide-formoterol (SYMBICORT) 160-4.5 MCG/ACT inhaler Inhale 2 puffs into the lungs 2 (two) times daily.   calcitRIOL (ROCALTROL) 0.25 MCG capsule Take 0.25 mcg by mouth daily.   glucose blood test strip 1 each by Other route 2 (two) times daily. Use as instructed to check blood glucose twice daily   HYDROcodone-acetaminophen (NORCO/VICODIN) 5-325 MG tablet Take 2 tablets by mouth every 4 (four) hours as needed.   hydrocortisone (ANUSOL-HC) 2.5 % rectal cream Place 1 application rectally 2 (two) times daily.   Insulin Pen Needle (DROPLET PEN NEEDLES) 31G X 5 MM MISC Use to inject insulin daily as directed   LANTUS SOLOSTAR 100 UNIT/ML Solostar Pen INJECT  30 UNITS SUBCUTANEOUSLY AT BEDTIME (Patient taking differently: 30 Units at bedtime.)   loperamide HCl (IMODIUM) 1 MG/7.5ML suspension Take 7.5 mLs (1 mg total) by mouth as needed for diarrhea or loose stools.   losartan (COZAAR) 25 MG tablet Take 1 tablet (25 mg total) by mouth daily.   metoprolol succinate (TOPROL-XL) 25 MG 24 hr tablet Take 25 mg by mouth daily.   pantoprazole (PROTONIX) 40 MG tablet Take 1 tablet (40 mg total) by mouth 2 (two) times daily before a meal.   simvastatin (ZOCOR) 20 MG tablet Take 20 mg by mouth every morning.   ticagrelor (BRILINTA) 90 MG TABS tablet Take 1 tablet (90 mg total) by mouth 2 (two) times daily.   torsemide (DEMADEX) 100 MG tablet Take 100 mg by mouth daily.    TRADJENTA 5 MG TABS tablet TAKE 1 TABLET EVERY DAY   Vitamin D, Ergocalciferol, (DRISDOL) 1.25 MG (50000 UNIT) CAPS capsule Take 50,000 Units by mouth every 30 (thirty) days.     Allergies:   Opana [oxymorphone hcl], Oxymorphone, and Tramadol   Social History   Socioeconomic History   Marital status: Married    Spouse name: Not on file  Number of children: 2   Years of education: 12th grade   Highest education level: Not on file  Occupational History   Occupation: disabled   Occupation:      Fish farm manager: UNEMPLOYED  Tobacco Use   Smoking status: Former    Packs/day: 1.00    Years: 25.00    Pack years: 25.00    Types: Cigarettes    Quit date: 03/27/2010    Years since quitting: 11.4   Smokeless tobacco: Never   Tobacco comments:    Quit x 7 years  Vaping Use   Vaping Use: Never used  Substance and Sexual Activity   Alcohol use: Yes    Alcohol/week: 0.0 standard drinks    Comment: occasionally   Drug use: Not Currently    Types: Marijuana    Comment: cocaine- last time used- 11/24/2017 , marijuana-    Sexual activity: Not on file  Other Topics Concern   Not on file  Social History Narrative   He quit smoking in 2010. 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 He developed pulmonary problems, became disabled because of lower airway disease in 2009.       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   Social Determinants of Health   Financial Resource Strain: Not on file  Food Insecurity: Not on file  Transportation Needs: Not on file  Physical Activity: Not on file  Stress: Not on file  Social Connections: Not on file     Family History: The patient's family history includes Arthritis in an other family member; Asthma in an other family member; Cancer in his mother; Diabetes in his father, mother, and sister; Hypertension in his father, mother, and sister; Lung disease in an other family member. There is no history of Anesthesia  problems, Hypotension, Malignant hyperthermia, Pseudochol deficiency, or Colon cancer.  ROS:   Please see the history of present illness.    + shortness of breath All other systems reviewed and are negative.  Labs/Other Studies Reviewed:    The following studies were reviewed today:  LHC 07/25/21    Mid RCA-1 lesion is 80% stenosed.   Mid RCA-2 lesion is 75% stenosed.   Prox RCA lesion is 80% stenosed.   Prox LAD lesion is 50% stenosed.   1st Mrg lesion is 50% stenosed.   A drug-eluting stent was successfully placed using a STENT ONYX FRONTIER 3.5X15.   Balloon angioplasty was performed using a BALLN SAPPHIRE 3.0X15.   Balloon angioplasty was performed using a BALLN SAPPHIRE 3.0X15.   Post intervention, there is a 30% residual stenosis.   Post intervention, there is a 30% residual stenosis.   Post intervention, there is a 10% residual stenosis.   1.  Severe complex RCA stenosis with severe disease in the proximal, mid, and distal vessel, treated successfully with PTCA and stenting of the proximal lesion, and PTCA alone of the mid and distal lesions.  A 3.5 x 15 mm Onyx frontier DES is used to treat the proximal lesion.  The mid and distal RCA lesions had appearance of calcific and thrombotic disease, both improved with PTCA. 2.  Mild to moderate nonobstructive calcific plaquing in the proximal LAD with no severe stenoses throughout the LAD distribution. 3.  Mild to moderate calcific stenosis of the first OM branch of the circumflex, with no high-grade stenoses throughout the circumflex distribution.  Recommendations: Dual antiplatelet therapy with aspirin and ticagrelor x12 months as tolerated.  Post MI medical  therapy.    Echo 07/23/21  Left Ventricle: Left ventricular ejection fraction, by estimation, is 55  to 60%. The left ventricle has normal function. The left ventricle has no  regional wall motion abnormalities. The left ventricular internal cavity  size was normal in size.  There is  severe left ventricular hypertrophy. Left ventricular diastolic  parameters are consistent with Grade I diastolic dysfunction (impaired  relaxation). Elevated left atrial pressure.  Right Ventricle: The right ventricular size is normal. Right vetricular  wall thickness was not assessed. Right ventricular systolic function is  normal. There is normal pulmonary artery systolic pressure. The tricuspid  regurgitant velocity is 2.73 m/s,  and with an assumed right atrial pressure of 3 mmHg, the estimated right  ventricular systolic pressure is 50.0 mmHg.  Left Atrium: Left atrial size was mildly dilated.  Right Atrium: Right atrial size was normal in size.  Pericardium: Trivial pericardial effusion is present.  Mitral Valve: There is mild thickening of the mitral valve leaflet(s).  Mild mitral annular calcification. Mild mitral valve regurgitation. MV  peak gradient, 8.8 mmHg. The mean mitral valve gradient is 4.0 mmHg.  Tricuspid Valve: The tricuspid valve is normal in structure. Tricuspid  valve regurgitation is trivial.  Aortic Valve: AV is thickened with minimally restricted motion. Peak and  mean gradients through the valve aer 19 and 10 mm Hg respectively. The  aortic valve is tricuspid. Aortic valve regurgitation is trivial. Aortic  valve sclerosis is present, with no evidence of aortic valve stenosis. Aortic valve mean gradient measures  10.5 mmHg. Aortic valve peak gradient measures 20.5 mmHg. Aortic valve  area, by VTI measures 2.02 cm.  Pulmonic Valve: The pulmonic valve was normal in structure. Pulmonic valve  regurgitation is not visualized.  Aorta: The aortic root and ascending aorta are structurally normal, with  no evidence of dilitation.  IAS/Shunts: No atrial level shunt detected by color flow Doppler.   Myoview 2/20 There was no ST segment deviation noted during stress. This is a low risk study. The study is normal. There are no perfusion defects consistent  with prior infarct or current ischemia. The left ventricular ejection fraction is normal (55-65%).   Echo 08/29/18  Left Ventricle: The left ventricle has low normal systolic function, with  an ejection fraction of 50-55%. The cavity size was normal. There is  severely increased left ventricular wall thickness. Left ventricular  diastolic Doppler parameters are  consistent with pseudonormalization Elevated mean left atrial pressure  Right Ventricle: The right ventricle has normal systolic function. The  cavity was normal. There is no increase in right ventricular wall  thickness.  Left Atrium: left atrial size was moderately dilated  Right Atrium: right atrial size was moderately dilated Right atrial  pressure is estimated at 3 mmHg.  Interatrial Septum: No atrial level shunt detected by color flow Doppler.  Pericardium: There is no evidence of pericardial effusion.  Mitral Valve: The mitral valve is normal in structure. Mitral valve  regurgitation is mild by color flow Doppler. No evidence of mitral valve  stenosis.  Tricuspid Valve: The tricuspid valve is normal in structure. Tricuspid  valve regurgitation was not visualized by color flow Doppler.  Aortic Valve: The aortic valve is tricuspid There is mild thickening of  the aortic valve. Aortic valve regurgitation was not visualized by color  flow Doppler.  Pulmonic Valve: The pulmonic valve was not well visualized. The pulmonic  valve was grossly normal. Pulmonic valve regurgitation is not visualized  by color  flow Doppler. No evidence of pulmonic stenosis.  Aorta: The aortic root is normal in size and structure.  Venous: The inferior vena cava is normal in size with greater than 50%  respiratory variability.    Recent Labs: 07/29/2021: B Natriuretic Peptide 154.0; Magnesium 2.4; TSH 3.386 08/14/2021: ALT 21; BUN 50; Creatinine, Ser 18.51; Hemoglobin 10.1; Platelets PLATELET CLUMPS NOTED ON SMEAR, UNABLE TO ESTIMATE; Potassium  3.4; Sodium 130   Recent Lipid Panel    Component Value Date/Time   CHOL 104 06/10/2021 1337   TRIG 110 06/10/2021 1337   HDL 30 (L) 06/10/2021 1337   CHOLHDL 3.5 06/10/2021 1337   CHOLHDL 2.8 09/06/2018 1018   VLDL 38 (H) 06/16/2016 0956   LDLCALC 53 06/10/2021 1337   LDLCALC 52 09/06/2018 1018     Risk Assessment/Calculations:       Physical Exam:    VS:  BP 138/78    Pulse 82    Ht 5\' 4"  (1.626 m)    Wt 229 lb 1.6 oz (103.9 kg)    BMI 39.32 kg/m     Wt Readings from Last 3 Encounters:  09/01/21 229 lb 1.6 oz (103.9 kg)  08/14/21 221 lb (100.2 kg)  07/27/21 224 lb 10.4 oz (101.9 kg)     GEN:  Well nourished, well developed in no acute distress HEENT: Normal NECK: No JVD; No carotid bruits CARDIAC: RRR, no murmurs, rubs, gallops RESPIRATORY:  Diminished breath sounds bilaterally without rales, wheezing or rhonchi  ABDOMEN: Soft, non-tender, non-distended MUSCULOSKELETAL:  No edema; No deformity. 2+ pedal pulses, equal bilaterally SKIN: Warm and dry NEUROLOGIC:  Alert and oriented x 3 PSYCHIATRIC:  Normal affect   EKG:  EKG is not ordered today.   Diagnoses:    1. Hyperlipidemia LDL goal <70   2. Coronary artery disease involving native coronary artery of native heart without angina pectoris   3. ST elevation myocardial infarction involving right coronary artery ([place])   4. Essential hypertension   5. ESRD (end stage renal disease) ([place])   6. Chronic heart failure with preserved ejection fraction (HFpEF) (HCC)    Assessment and Plan:    CAD without angina s/p STEMI s/p DES to RCA: He denies chest pain. Has dyspnea but I am not suspicious for angina equivalent. No bleeding concerns on DAPT. 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  He reports his right groin cath site has healed without any problem.  Encouraged him to progress slowly. Encouraged low sodium diet, regular physical activity, weight loss. Continue GDMT  including aspirin, Brilinta, metoprolol, statin.    HFpEF: LVEF 55-60%, G1DD, severe LVH by echo 07/23/21.  Continues to have shortness of breath which is difficult to discern due to recent COVID-19 infection and history of COPD. Appears euvolemic today. He denies weight gain, edema, orthopnea, PND. Encouraged him to monitor daily weight. Encouraged soon follow-up with pulmonology.  Continue torsemide, losartan.   Essential hypertension: BP well controlled today.  Labile blood pressure readings at home.  Losartan was recently stopped in the setting of hypotension. Advised him to take losartan 25 mg as needed for BP > 140/80.  Continue metoprolol, torsemide.   Hyperlipidemia LDL goal < 70: LDL 53.  We will recheck prior to next office visit in 3 months.  Continue simvastatin.   ESRD: Creatinine stable on 08/14/21.  He continues peritoneal dialysis at home. Management per nephrology.  Disposition: 3 months with Dr. Debara Pickett  Medication Adjustments/Labs and Tests Ordered: Current medicines are reviewed at length with the patient today.  Concerns regarding medicines are outlined above.  Orders Placed This Encounter  Procedures   Lipid panel   Meds ordered this encounter  Medications   losartan (COZAAR) 25 MG tablet    Sig: Take 1 tablet (25 mg total) by mouth daily.    Dispense:  30 tablet    Refill:  6    Patient Instructions  Medication Instructions:  Your physician has recommended you make the following change in your medication:   Start: Losartan 25mg  only when your blood pressure is greater than 140/80  *If you need a refill on your cardiac medications before your next appointment, please call your pharmacy*   Lab Work: Please return for Lab work 1 week before your next appointment. Please be fasting for a Lipid Panel!  You may come to the...   [facility] (3rd floor) [address] [place], [place] [address]  Open: 8am-Noon and 1pm-4:30pm   [place] [facility]- Any location  **no appointments needed**  If you have labs (blood work) drawn today and your tests are completely normal, you will receive your results only by: [employer] (if you have MyChart) OR A paper copy in the mail If you have any lab test that is abnormal or we need to change your treatment, we will call you to review the results.   Testing/Procedures: None ordered today    Follow-Up: At [facility], you and your health needs are our priority.  As part of our continuing mission to provide you with exceptional heart care, we have created designated Provider Care Teams.  These Care Teams include your primary Cardiologist (physician) and Advanced Practice Providers (APPs -  Physician Assistants and Nurse Practitioners) who all work together to provide you with the care you need, when you need it.  We recommend signing up for the patient portal called "MyChart".  Sign up information is provided on this After Visit Summary.  MyChart is used to connect with patients for Virtual Visits (Telemedicine).  Patients are able to view lab/test results, encounter notes, upcoming appointments, etc.  Non-urgent messages can be sent to your provider as well.   To learn more about what you can do with MyChart, go to [email or web address].    Your next appointment:   Dr. Debara Pickett at the [facility] on May 18th at [place], Emmaline Life, NP  09/05/2021 1:40 PM    [place]

## 2021-09-02 DIAGNOSIS — N2581 Secondary hyperparathyroidism of renal origin: Secondary | ICD-10-CM | POA: Diagnosis not present

## 2021-09-02 DIAGNOSIS — D631 Anemia in chronic kidney disease: Secondary | ICD-10-CM | POA: Diagnosis not present

## 2021-09-02 DIAGNOSIS — D509 Iron deficiency anemia, unspecified: Secondary | ICD-10-CM | POA: Diagnosis not present

## 2021-09-02 DIAGNOSIS — Z992 Dependence on renal dialysis: Secondary | ICD-10-CM | POA: Diagnosis not present

## 2021-09-02 DIAGNOSIS — N186 End stage renal disease: Secondary | ICD-10-CM | POA: Diagnosis not present

## 2021-09-03 DIAGNOSIS — N2581 Secondary hyperparathyroidism of renal origin: Secondary | ICD-10-CM | POA: Diagnosis not present

## 2021-09-03 DIAGNOSIS — D631 Anemia in chronic kidney disease: Secondary | ICD-10-CM | POA: Diagnosis not present

## 2021-09-03 DIAGNOSIS — D509 Iron deficiency anemia, unspecified: Secondary | ICD-10-CM | POA: Diagnosis not present

## 2021-09-03 DIAGNOSIS — N186 End stage renal disease: Secondary | ICD-10-CM | POA: Diagnosis not present

## 2021-09-03 DIAGNOSIS — Z992 Dependence on renal dialysis: Secondary | ICD-10-CM | POA: Diagnosis not present

## 2021-09-04 DIAGNOSIS — Z992 Dependence on renal dialysis: Secondary | ICD-10-CM | POA: Diagnosis not present

## 2021-09-04 DIAGNOSIS — D631 Anemia in chronic kidney disease: Secondary | ICD-10-CM | POA: Diagnosis not present

## 2021-09-04 DIAGNOSIS — D509 Iron deficiency anemia, unspecified: Secondary | ICD-10-CM | POA: Diagnosis not present

## 2021-09-04 DIAGNOSIS — N186 End stage renal disease: Secondary | ICD-10-CM | POA: Diagnosis not present

## 2021-09-04 DIAGNOSIS — N2581 Secondary hyperparathyroidism of renal origin: Secondary | ICD-10-CM | POA: Diagnosis not present

## 2021-09-05 ENCOUNTER — Telehealth: Payer: Self-pay | Admitting: Nurse Practitioner

## 2021-09-05 DIAGNOSIS — D631 Anemia in chronic kidney disease: Secondary | ICD-10-CM | POA: Diagnosis not present

## 2021-09-05 DIAGNOSIS — D509 Iron deficiency anemia, unspecified: Secondary | ICD-10-CM | POA: Diagnosis not present

## 2021-09-05 DIAGNOSIS — N186 End stage renal disease: Secondary | ICD-10-CM | POA: Diagnosis not present

## 2021-09-05 DIAGNOSIS — Z992 Dependence on renal dialysis: Secondary | ICD-10-CM | POA: Diagnosis not present

## 2021-09-05 DIAGNOSIS — N2581 Secondary hyperparathyroidism of renal origin: Secondary | ICD-10-CM | POA: Diagnosis not present

## 2021-09-05 NOTE — Telephone Encounter (Signed)
Please advise 

## 2021-09-05 NOTE — Telephone Encounter (Signed)
Patient's wife wants to know if it's okay of him to [facility] the [employer] and do [facility]obics.

## 2021-09-05 NOTE — Telephone Encounter (Signed)
Called patient to update them on using the [employer] for working out!    Patient is closest to the [facility] in [place] and will try some water aerobics classes there.   Okay per Christen Bame, NP

## 2021-09-05 NOTE — Telephone Encounter (Signed)
Absolutely. If he wants to visit the [facility] in downtown [place] or the [place], we can refer him to the PREP program.

## 2021-09-06 DIAGNOSIS — Z992 Dependence on renal dialysis: Secondary | ICD-10-CM | POA: Diagnosis not present

## 2021-09-06 DIAGNOSIS — N186 End stage renal disease: Secondary | ICD-10-CM | POA: Diagnosis not present

## 2021-09-06 DIAGNOSIS — N2581 Secondary hyperparathyroidism of renal origin: Secondary | ICD-10-CM | POA: Diagnosis not present

## 2021-09-06 DIAGNOSIS — D631 Anemia in chronic kidney disease: Secondary | ICD-10-CM | POA: Diagnosis not present

## 2021-09-06 DIAGNOSIS — D509 Iron deficiency anemia, unspecified: Secondary | ICD-10-CM | POA: Diagnosis not present

## 2021-09-07 DIAGNOSIS — N186 End stage renal disease: Secondary | ICD-10-CM | POA: Diagnosis not present

## 2021-09-07 DIAGNOSIS — D631 Anemia in chronic kidney disease: Secondary | ICD-10-CM | POA: Diagnosis not present

## 2021-09-07 DIAGNOSIS — N2581 Secondary hyperparathyroidism of renal origin: Secondary | ICD-10-CM | POA: Diagnosis not present

## 2021-09-07 DIAGNOSIS — D509 Iron deficiency anemia, unspecified: Secondary | ICD-10-CM | POA: Diagnosis not present

## 2021-09-07 DIAGNOSIS — Z992 Dependence on renal dialysis: Secondary | ICD-10-CM | POA: Diagnosis not present

## 2021-09-08 DIAGNOSIS — D509 Iron deficiency anemia, unspecified: Secondary | ICD-10-CM | POA: Diagnosis not present

## 2021-09-08 DIAGNOSIS — Z992 Dependence on renal dialysis: Secondary | ICD-10-CM | POA: Diagnosis not present

## 2021-09-08 DIAGNOSIS — D631 Anemia in chronic kidney disease: Secondary | ICD-10-CM | POA: Diagnosis not present

## 2021-09-08 DIAGNOSIS — N186 End stage renal disease: Secondary | ICD-10-CM | POA: Diagnosis not present

## 2021-09-08 DIAGNOSIS — N2581 Secondary hyperparathyroidism of renal origin: Secondary | ICD-10-CM | POA: Diagnosis not present

## 2021-09-09 DIAGNOSIS — N186 End stage renal disease: Secondary | ICD-10-CM | POA: Diagnosis not present

## 2021-09-09 DIAGNOSIS — D631 Anemia in chronic kidney disease: Secondary | ICD-10-CM | POA: Diagnosis not present

## 2021-09-09 DIAGNOSIS — N2581 Secondary hyperparathyroidism of renal origin: Secondary | ICD-10-CM | POA: Diagnosis not present

## 2021-09-09 DIAGNOSIS — Z992 Dependence on renal dialysis: Secondary | ICD-10-CM | POA: Diagnosis not present

## 2021-09-09 DIAGNOSIS — D509 Iron deficiency anemia, unspecified: Secondary | ICD-10-CM | POA: Diagnosis not present

## 2021-09-10 DIAGNOSIS — D631 Anemia in chronic kidney disease: Secondary | ICD-10-CM | POA: Diagnosis not present

## 2021-09-10 DIAGNOSIS — N186 End stage renal disease: Secondary | ICD-10-CM | POA: Diagnosis not present

## 2021-09-10 DIAGNOSIS — N2581 Secondary hyperparathyroidism of renal origin: Secondary | ICD-10-CM | POA: Diagnosis not present

## 2021-09-10 DIAGNOSIS — Z992 Dependence on renal dialysis: Secondary | ICD-10-CM | POA: Diagnosis not present

## 2021-09-10 DIAGNOSIS — D509 Iron deficiency anemia, unspecified: Secondary | ICD-10-CM | POA: Diagnosis not present

## 2021-09-11 DIAGNOSIS — N2581 Secondary hyperparathyroidism of renal origin: Secondary | ICD-10-CM | POA: Diagnosis not present

## 2021-09-11 DIAGNOSIS — N186 End stage renal disease: Secondary | ICD-10-CM | POA: Diagnosis not present

## 2021-09-11 DIAGNOSIS — D509 Iron deficiency anemia, unspecified: Secondary | ICD-10-CM | POA: Diagnosis not present

## 2021-09-11 DIAGNOSIS — D631 Anemia in chronic kidney disease: Secondary | ICD-10-CM | POA: Diagnosis not present

## 2021-09-11 DIAGNOSIS — Z992 Dependence on renal dialysis: Secondary | ICD-10-CM | POA: Diagnosis not present

## 2021-09-12 DIAGNOSIS — N2581 Secondary hyperparathyroidism of renal origin: Secondary | ICD-10-CM | POA: Diagnosis not present

## 2021-09-12 DIAGNOSIS — N186 End stage renal disease: Secondary | ICD-10-CM | POA: Diagnosis not present

## 2021-09-12 DIAGNOSIS — D631 Anemia in chronic kidney disease: Secondary | ICD-10-CM | POA: Diagnosis not present

## 2021-09-12 DIAGNOSIS — D509 Iron deficiency anemia, unspecified: Secondary | ICD-10-CM | POA: Diagnosis not present

## 2021-09-12 DIAGNOSIS — Z992 Dependence on renal dialysis: Secondary | ICD-10-CM | POA: Diagnosis not present

## 2021-09-13 DIAGNOSIS — D631 Anemia in chronic kidney disease: Secondary | ICD-10-CM | POA: Diagnosis not present

## 2021-09-13 DIAGNOSIS — N2581 Secondary hyperparathyroidism of renal origin: Secondary | ICD-10-CM | POA: Diagnosis not present

## 2021-09-13 DIAGNOSIS — E1142 Type 2 diabetes mellitus with diabetic polyneuropathy: Secondary | ICD-10-CM | POA: Diagnosis not present

## 2021-09-13 DIAGNOSIS — N186 End stage renal disease: Secondary | ICD-10-CM | POA: Diagnosis not present

## 2021-09-13 DIAGNOSIS — Z992 Dependence on renal dialysis: Secondary | ICD-10-CM | POA: Diagnosis not present

## 2021-09-13 DIAGNOSIS — D509 Iron deficiency anemia, unspecified: Secondary | ICD-10-CM | POA: Diagnosis not present

## 2021-09-13 DIAGNOSIS — B351 Tinea unguium: Secondary | ICD-10-CM | POA: Diagnosis not present

## 2021-09-14 DIAGNOSIS — Z992 Dependence on renal dialysis: Secondary | ICD-10-CM | POA: Diagnosis not present

## 2021-09-14 DIAGNOSIS — N186 End stage renal disease: Secondary | ICD-10-CM | POA: Diagnosis not present

## 2021-09-14 DIAGNOSIS — D631 Anemia in chronic kidney disease: Secondary | ICD-10-CM | POA: Diagnosis not present

## 2021-09-14 DIAGNOSIS — D509 Iron deficiency anemia, unspecified: Secondary | ICD-10-CM | POA: Diagnosis not present

## 2021-09-14 DIAGNOSIS — N2581 Secondary hyperparathyroidism of renal origin: Secondary | ICD-10-CM | POA: Diagnosis not present

## 2021-09-15 DIAGNOSIS — D631 Anemia in chronic kidney disease: Secondary | ICD-10-CM | POA: Diagnosis not present

## 2021-09-15 DIAGNOSIS — Z992 Dependence on renal dialysis: Secondary | ICD-10-CM | POA: Diagnosis not present

## 2021-09-15 DIAGNOSIS — N2581 Secondary hyperparathyroidism of renal origin: Secondary | ICD-10-CM | POA: Diagnosis not present

## 2021-09-15 DIAGNOSIS — N186 End stage renal disease: Secondary | ICD-10-CM | POA: Diagnosis not present

## 2021-09-15 DIAGNOSIS — D509 Iron deficiency anemia, unspecified: Secondary | ICD-10-CM | POA: Diagnosis not present

## 2021-09-16 DIAGNOSIS — N2581 Secondary hyperparathyroidism of renal origin: Secondary | ICD-10-CM | POA: Diagnosis not present

## 2021-09-16 DIAGNOSIS — D631 Anemia in chronic kidney disease: Secondary | ICD-10-CM | POA: Diagnosis not present

## 2021-09-16 DIAGNOSIS — N186 End stage renal disease: Secondary | ICD-10-CM | POA: Diagnosis not present

## 2021-09-16 DIAGNOSIS — D509 Iron deficiency anemia, unspecified: Secondary | ICD-10-CM | POA: Diagnosis not present

## 2021-09-16 DIAGNOSIS — Z992 Dependence on renal dialysis: Secondary | ICD-10-CM | POA: Diagnosis not present

## 2021-09-17 DIAGNOSIS — Z992 Dependence on renal dialysis: Secondary | ICD-10-CM | POA: Diagnosis not present

## 2021-09-17 DIAGNOSIS — N2581 Secondary hyperparathyroidism of renal origin: Secondary | ICD-10-CM | POA: Diagnosis not present

## 2021-09-17 DIAGNOSIS — N186 End stage renal disease: Secondary | ICD-10-CM | POA: Diagnosis not present

## 2021-09-17 DIAGNOSIS — D509 Iron deficiency anemia, unspecified: Secondary | ICD-10-CM | POA: Diagnosis not present

## 2021-09-17 DIAGNOSIS — D631 Anemia in chronic kidney disease: Secondary | ICD-10-CM | POA: Diagnosis not present

## 2021-09-18 DIAGNOSIS — Z992 Dependence on renal dialysis: Secondary | ICD-10-CM | POA: Diagnosis not present

## 2021-09-18 DIAGNOSIS — N186 End stage renal disease: Secondary | ICD-10-CM | POA: Diagnosis not present

## 2021-09-18 DIAGNOSIS — N2581 Secondary hyperparathyroidism of renal origin: Secondary | ICD-10-CM | POA: Diagnosis not present

## 2021-09-18 DIAGNOSIS — D509 Iron deficiency anemia, unspecified: Secondary | ICD-10-CM | POA: Diagnosis not present

## 2021-09-18 DIAGNOSIS — D631 Anemia in chronic kidney disease: Secondary | ICD-10-CM | POA: Diagnosis not present

## 2021-09-19 DIAGNOSIS — N2581 Secondary hyperparathyroidism of renal origin: Secondary | ICD-10-CM | POA: Diagnosis not present

## 2021-09-19 DIAGNOSIS — Z20822 Contact with and (suspected) exposure to covid-19: Secondary | ICD-10-CM | POA: Diagnosis not present

## 2021-09-19 DIAGNOSIS — D631 Anemia in chronic kidney disease: Secondary | ICD-10-CM | POA: Diagnosis not present

## 2021-09-19 DIAGNOSIS — D509 Iron deficiency anemia, unspecified: Secondary | ICD-10-CM | POA: Diagnosis not present

## 2021-09-19 DIAGNOSIS — Z992 Dependence on renal dialysis: Secondary | ICD-10-CM | POA: Diagnosis not present

## 2021-09-19 DIAGNOSIS — N186 End stage renal disease: Secondary | ICD-10-CM | POA: Diagnosis not present

## 2021-09-20 DIAGNOSIS — D631 Anemia in chronic kidney disease: Secondary | ICD-10-CM | POA: Diagnosis not present

## 2021-09-20 DIAGNOSIS — Z992 Dependence on renal dialysis: Secondary | ICD-10-CM | POA: Diagnosis not present

## 2021-09-20 DIAGNOSIS — N2581 Secondary hyperparathyroidism of renal origin: Secondary | ICD-10-CM | POA: Diagnosis not present

## 2021-09-20 DIAGNOSIS — D509 Iron deficiency anemia, unspecified: Secondary | ICD-10-CM | POA: Diagnosis not present

## 2021-09-20 DIAGNOSIS — N186 End stage renal disease: Secondary | ICD-10-CM | POA: Diagnosis not present

## 2021-09-21 DIAGNOSIS — D631 Anemia in chronic kidney disease: Secondary | ICD-10-CM | POA: Diagnosis not present

## 2021-09-21 DIAGNOSIS — D509 Iron deficiency anemia, unspecified: Secondary | ICD-10-CM | POA: Diagnosis not present

## 2021-09-21 DIAGNOSIS — N186 End stage renal disease: Secondary | ICD-10-CM | POA: Diagnosis not present

## 2021-09-21 DIAGNOSIS — Z992 Dependence on renal dialysis: Secondary | ICD-10-CM | POA: Diagnosis not present

## 2021-09-21 DIAGNOSIS — N2581 Secondary hyperparathyroidism of renal origin: Secondary | ICD-10-CM | POA: Diagnosis not present

## 2021-09-22 DIAGNOSIS — D509 Iron deficiency anemia, unspecified: Secondary | ICD-10-CM | POA: Diagnosis not present

## 2021-09-22 DIAGNOSIS — N2581 Secondary hyperparathyroidism of renal origin: Secondary | ICD-10-CM | POA: Diagnosis not present

## 2021-09-22 DIAGNOSIS — Z992 Dependence on renal dialysis: Secondary | ICD-10-CM | POA: Diagnosis not present

## 2021-09-22 DIAGNOSIS — D631 Anemia in chronic kidney disease: Secondary | ICD-10-CM | POA: Diagnosis not present

## 2021-09-22 DIAGNOSIS — N186 End stage renal disease: Secondary | ICD-10-CM | POA: Diagnosis not present

## 2021-09-23 DIAGNOSIS — D631 Anemia in chronic kidney disease: Secondary | ICD-10-CM | POA: Diagnosis not present

## 2021-09-23 DIAGNOSIS — Z992 Dependence on renal dialysis: Secondary | ICD-10-CM | POA: Diagnosis not present

## 2021-09-23 DIAGNOSIS — N186 End stage renal disease: Secondary | ICD-10-CM | POA: Diagnosis not present

## 2021-09-23 DIAGNOSIS — D509 Iron deficiency anemia, unspecified: Secondary | ICD-10-CM | POA: Diagnosis not present

## 2021-09-23 DIAGNOSIS — N2581 Secondary hyperparathyroidism of renal origin: Secondary | ICD-10-CM | POA: Diagnosis not present

## 2021-09-24 DIAGNOSIS — N2581 Secondary hyperparathyroidism of renal origin: Secondary | ICD-10-CM | POA: Diagnosis not present

## 2021-09-24 DIAGNOSIS — D509 Iron deficiency anemia, unspecified: Secondary | ICD-10-CM | POA: Diagnosis not present

## 2021-09-24 DIAGNOSIS — N186 End stage renal disease: Secondary | ICD-10-CM | POA: Diagnosis not present

## 2021-09-24 DIAGNOSIS — Z992 Dependence on renal dialysis: Secondary | ICD-10-CM | POA: Diagnosis not present

## 2021-09-24 DIAGNOSIS — D631 Anemia in chronic kidney disease: Secondary | ICD-10-CM | POA: Diagnosis not present

## 2021-09-25 DIAGNOSIS — D509 Iron deficiency anemia, unspecified: Secondary | ICD-10-CM | POA: Diagnosis not present

## 2021-09-25 DIAGNOSIS — N2581 Secondary hyperparathyroidism of renal origin: Secondary | ICD-10-CM | POA: Diagnosis not present

## 2021-09-25 DIAGNOSIS — Z992 Dependence on renal dialysis: Secondary | ICD-10-CM | POA: Diagnosis not present

## 2021-09-25 DIAGNOSIS — D631 Anemia in chronic kidney disease: Secondary | ICD-10-CM | POA: Diagnosis not present

## 2021-09-25 DIAGNOSIS — N186 End stage renal disease: Secondary | ICD-10-CM | POA: Diagnosis not present

## 2021-09-26 DIAGNOSIS — D631 Anemia in chronic kidney disease: Secondary | ICD-10-CM | POA: Diagnosis not present

## 2021-09-26 DIAGNOSIS — Z992 Dependence on renal dialysis: Secondary | ICD-10-CM | POA: Diagnosis not present

## 2021-09-26 DIAGNOSIS — N2581 Secondary hyperparathyroidism of renal origin: Secondary | ICD-10-CM | POA: Diagnosis not present

## 2021-09-26 DIAGNOSIS — N186 End stage renal disease: Secondary | ICD-10-CM | POA: Diagnosis not present

## 2021-09-26 DIAGNOSIS — D509 Iron deficiency anemia, unspecified: Secondary | ICD-10-CM | POA: Diagnosis not present

## 2021-09-27 DIAGNOSIS — D631 Anemia in chronic kidney disease: Secondary | ICD-10-CM | POA: Diagnosis not present

## 2021-09-27 DIAGNOSIS — N2581 Secondary hyperparathyroidism of renal origin: Secondary | ICD-10-CM | POA: Diagnosis not present

## 2021-09-27 DIAGNOSIS — Z992 Dependence on renal dialysis: Secondary | ICD-10-CM | POA: Diagnosis not present

## 2021-09-27 DIAGNOSIS — N186 End stage renal disease: Secondary | ICD-10-CM | POA: Diagnosis not present

## 2021-09-27 DIAGNOSIS — D509 Iron deficiency anemia, unspecified: Secondary | ICD-10-CM | POA: Diagnosis not present

## 2021-09-28 DIAGNOSIS — D631 Anemia in chronic kidney disease: Secondary | ICD-10-CM | POA: Diagnosis not present

## 2021-09-28 DIAGNOSIS — N186 End stage renal disease: Secondary | ICD-10-CM | POA: Diagnosis not present

## 2021-09-28 DIAGNOSIS — N2581 Secondary hyperparathyroidism of renal origin: Secondary | ICD-10-CM | POA: Diagnosis not present

## 2021-09-28 DIAGNOSIS — Z992 Dependence on renal dialysis: Secondary | ICD-10-CM | POA: Diagnosis not present

## 2021-09-28 DIAGNOSIS — D509 Iron deficiency anemia, unspecified: Secondary | ICD-10-CM | POA: Diagnosis not present

## 2021-09-29 ENCOUNTER — Encounter (HOSPITAL_COMMUNITY): Payer: Self-pay

## 2021-09-29 ENCOUNTER — Encounter (HOSPITAL_COMMUNITY)
Admission: RE | Admit: 2021-09-29 | Discharge: 2021-09-29 | Disposition: A | Discharge status: Home / Self Care | Payer: Medicare Other | Source: Ambulatory Visit | Attending: Internal Medicine | Admitting: Internal Medicine

## 2021-09-29 VITALS — BP 118/72 | HR 68 | Ht 64.0 in | Wt 239.0 lb

## 2021-09-29 DIAGNOSIS — D631 Anemia in chronic kidney disease: Secondary | ICD-10-CM | POA: Diagnosis not present

## 2021-09-29 DIAGNOSIS — Z9861 Coronary angioplasty status: Secondary | ICD-10-CM | POA: Diagnosis not present

## 2021-09-29 DIAGNOSIS — N2581 Secondary hyperparathyroidism of renal origin: Secondary | ICD-10-CM | POA: Diagnosis not present

## 2021-09-29 DIAGNOSIS — D509 Iron deficiency anemia, unspecified: Secondary | ICD-10-CM | POA: Diagnosis not present

## 2021-09-29 DIAGNOSIS — Z992 Dependence on renal dialysis: Secondary | ICD-10-CM | POA: Diagnosis not present

## 2021-09-29 DIAGNOSIS — I214 Non-ST elevation (NSTEMI) myocardial infarction: Secondary | ICD-10-CM | POA: Insufficient documentation

## 2021-09-29 DIAGNOSIS — Z955 Presence of coronary angioplasty implant and graft: Secondary | ICD-10-CM | POA: Diagnosis not present

## 2021-09-29 DIAGNOSIS — N186 End stage renal disease: Secondary | ICD-10-CM | POA: Diagnosis not present

## 2021-09-29 NOTE — Progress Notes (Signed)
Cardiac Individual Treatment Plan ? ?Patient Details  ?Name: [name] [name] ?MRN: [id number] ?Date of Birth: 05-10-1951 ?Referring Provider:   ?Flowsheet Row CARDIAC REHAB PHASE II ORIENTATION from 09/29/2021 in [place]  ?Referring Provider Dr. Kurtis Bushman  ? ?  ? ? ?Initial Encounter Date:  ?Flowsheet Row CARDIAC REHAB PHASE II ORIENTATION from 09/29/2021 in [place]  ?Date 09/29/21  ? ?  ? ? ?Visit Diagnosis: S/P coronary artery stent placement ? ?NSTEMI (non-ST elevated myocardial infarction) ([place]) ? ?S/P PTCA (percutaneous transluminal coronary angioplasty) ? ?Patient's Home Medications on Admission: ? ?Current Outpatient Medications:  ?  albuterol (VENTOLIN HFA) 108 (90 Base) MCG/ACT inhaler, Inhale 2 puffs into the lungs every 6 (six) hours as needed for wheezing or shortness of breath., Disp: 18 g, Rfl: 11 ?  aspirin EC 81 MG tablet, Take 81 mg by mouth daily. Swallow whole., Disp: , Rfl:  ?  budesonide-formoterol (SYMBICORT) 160-4.5 MCG/ACT inhaler, Inhale 2 puffs into the lungs 2 (two) times daily., Disp: 1 each, Rfl: 6 ?  calcitRIOL (ROCALTROL) 0.25 MCG capsule, Take 0.25 mcg by mouth daily., Disp: , Rfl:  ?  glucose blood test strip, 1 each by Other route 2 (two) times daily. Use as instructed to check blood glucose twice daily, Disp: 200 each, Rfl: 2 ?  Insulin Pen Needle (DROPLET PEN NEEDLES) 31G X 5 MM MISC, Use to inject insulin daily as directed, Disp: 150 each, Rfl: 2 ?  LANTUS SOLOSTAR 100 UNIT/ML Solostar Pen, INJECT  30 UNITS SUBCUTANEOUSLY AT BEDTIME (Patient taking differently: 30 Units at bedtime.), Disp: 30 mL, Rfl: 1 ?  loperamide HCl (IMODIUM) 1 MG/7.5ML suspension, Take 7.5 mLs (1 mg total) by mouth as needed for diarrhea or loose stools., Disp: 120 mL, Rfl: 0 ?  losartan (COZAAR) 25 MG tablet, Take 1 tablet (25 mg total) by mouth daily., Disp: 30 tablet, Rfl: 6 ?  metoprolol succinate (TOPROL-XL) 25 MG 24 hr tablet, Take 25 mg by mouth daily.,  Disp: , Rfl:  ?  pantoprazole (PROTONIX) 40 MG tablet, Take 1 tablet (40 mg total) by mouth 2 (two) times daily before a meal., Disp: 60 tablet, Rfl: 5 ?  simvastatin (ZOCOR) 20 MG tablet, Take 20 mg by mouth every morning., Disp: , Rfl:  ?  ticagrelor (BRILINTA) 90 MG TABS tablet, Take 1 tablet (90 mg total) by mouth 2 (two) times daily., Disp: 60 tablet, Rfl: 3 ?  torsemide (DEMADEX) 100 MG tablet, Take 100 mg by mouth daily., Disp: , Rfl:  ?  TRADJENTA 5 MG TABS tablet, TAKE 1 TABLET EVERY DAY, Disp: 90 tablet, Rfl: 1 ?  Vitamin D, Ergocalciferol, (DRISDOL) 1.25 MG (50000 UNIT) CAPS capsule, Take 50,000 Units by mouth every 7 (seven) days., Disp: , Rfl:  ?  benzonatate (TESSALON) 100 MG capsule, Take 1 capsule (100 mg total) by mouth every 8 (eight) hours., Disp: 21 capsule, Rfl: 0 ?  HYDROcodone-acetaminophen (NORCO/VICODIN) 5-325 MG tablet, Take 2 tablets by mouth every 4 (four) hours as needed. (Patient not taking: Reported on 09/29/2021), Disp: 6 tablet, Rfl: 0 ?  hydrocortisone (ANUSOL-HC) 2.5 % rectal cream, Place 1 application rectally 2 (two) times daily. (Patient not taking: Reported on 09/29/2021), Disp: 30 g, Rfl: 1 ? ?Past Medical History: ?Past Medical History:  ?Diagnosis Date  ? Anemia   ? Arthritis   ? Asthma   ? Bell palsy   ? CAD (coronary artery disease)   ? a. 2014 MV: abnl w/ infap ischemia;  b. 03/2013 Cath: aneurysmal bleb in the LAD w/ otw nonobs dzs-->Med Rx.  ? Chronic back pain   ? Chronic knee pain   ? a. 09/2015 s/p R TKA.  ? Chronic pain   ? Chronic shoulder pain   ? Chronic sinusitis   ? CKD (chronic kidney disease), stage IV ([place])   ? stage 4 per office visit note of Dr Lowanda Foster on 05/2015   ? COPD (chronic obstructive pulmonary disease) ([place])   ? Diabetes mellitus without complication ([place])   ? type II   ? Essential hypertension   ? GERD (gastroesophageal reflux disease)   ? Gout   ? Gout   ? Hepatomegaly   ? noted on noncontrast CT 2015  ? History of hiatal hernia   ? Hyperlipidemia    ? Lateral meniscus tear   ? Obesity   ? Truncal  ? Obstructive sleep apnea   ? does not use cpap   ? On home oxygen therapy   ? uses 2l when is going somewhere per patient   ? PUD (peptic ulcer disease)   ? remote, reports f/u EGD about 8 years ago unremarkable   ? Reactive airway disease   ? related to exposure to chemical during 9/11  ? Renal insufficiency   ? Sinusitis   ? Vitamin D deficiency   ? ? ?Tobacco Use: ?Social History  ? ?Tobacco Use  ?Smoking Status Former  ? Packs/day: 1.00  ? Years: 25.00  ? Pack years: 25.00  ? Types: Cigarettes  ? Quit date: 03/27/2010  ? Years since quitting: 11.5  ?Smokeless Tobacco Never  ?Tobacco Comments  ? Quit x 7 years  ? ? ?Labs: ?Recent Review Flowsheet Data   ? ? Labs for [facility] Cardiac and Pulmonary Rehab Latest Ref Rng & Units 06/16/2020 12/15/2020 06/10/2021 06/16/2021 07/22/2021  ? Cholestrol 100 - 199 mg/dL - - 104 - -  ? LDLCALC 0 - 99 mg/dL - - 53 - -  ? HDL >39 mg/dL - - 30(L) - -  ? Trlycerides 0 - 149 mg/dL - - 110 - -  ? Hemoglobin A1c 4.8 - 5.6 % 6.1 6.2(A) - 6.2 6.6(H)  ? PHART 7.350 - 7.450 - - - - -  ? PCO2ART 35.0 - 45.0 mmHg - - - - -  ? HCO3 20.0 - 24.0 mEq/L - - - - -  ? TCO2 0 - 100 mmol/L - - - - -  ? O2SAT % - - - - -  ? ?  ? ? ?Capillary Blood Glucose: ?Lab Results  ?Component Value Date  ? GLUCAP 118 (H) 07/27/2021  ? GLUCAP 161 (H) 07/26/2021  ? GLUCAP 107 (H) 07/26/2021  ? GLUCAP 126 (H) 07/26/2021  ? GLUCAP 104 (H) 07/26/2021  ? ? ? ?Exercise Target Goals: ?Exercise Program Goal: ?Individual exercise prescription set using results from initial 6 min walk test and THRR while considering  patient?s activity barriers and safety.  ? ?Exercise Prescription Goal: ?Starting with aerobic activity 30 plus minutes a day, 3 days per week for initial exercise prescription. Provide home exercise prescription and guidelines that participant acknowledges understanding prior to discharge. ? ?Activity Barriers & Risk Stratification: ? Activity Barriers & Cardiac Risk  Stratification - 09/29/21 1242   ? ?  ? Activity Barriers & Cardiac Risk Stratification  ? Activity Barriers Arthritis;Neck/Spine Problems;Back Problems;Shortness of Breath   ? Cardiac Risk Stratification High   ? ?  ?  ? ?  ? ? ?6  Minute Walk: ? 6 Minute Walk   ? ? [place] Name 09/29/21 1339  ?  ?  ?  ? 6 Minute Walk  ? Phase Initial    ? Distance 1150 feet    ? Walk Time 6 minutes    ? # of Rest Breaks 0    ? MPH 2.17    ? METS 1.97    ? RPE 12    ? VO2 Peak 6.92    ? Symptoms No    ? Resting HR 65 bpm    ? Resting BP 118/72    ? Resting Oxygen Saturation  98 %    ? Exercise Oxygen Saturation  during 6 min walk 94 %    ? Max Ex. HR 108 bpm    ? Max Ex. BP 142/60    ? 2 Minute Post BP 120/60    ? ?  ?  ? ?  ? ? ?Oxygen Initial Assessment: ? ? ?Oxygen Re-Evaluation: ? ? ?Oxygen Discharge (Final Oxygen Re-Evaluation): ? ? ?Initial Exercise Prescription: ? Initial Exercise Prescription - 09/29/21 1300   ? ?  ? Date of Initial Exercise RX and Referring Provider  ? Date 09/29/21   ? Referring Provider Dr. Kurtis Bushman   ? Expected Discharge Date 12/23/21   ?  ? Treadmill  ? MPH 1.3   ? Grade 0   ? Minutes 17   ?  ? NuStep  ? Level 1   ? SPM 60   ? Minutes 22   ?  ? Prescription Details  ? Frequency (times per week) 3   ? Duration Progress to 30 minutes of continuous aerobic without signs/symptoms of physical distress   ?  ? Intensity  ? THRR 40-80% of Max Heartrate 60-119   ? Ratings of Perceived Exertion 11-13   ?  ? Progression  ? Progression Continue progressive overload as per policy without signs/symptoms or physical distress.   ?  ? Resistance Training  ? Training Prescription Yes   ? Weight 3   ? Reps 10-15   ? ?  ?  ? ?  ? ? ?Perform Capillary Blood Glucose checks as needed. ? ?Exercise Prescription Changes: ? ? ?Exercise Comments: ? ? ?Exercise Goals and Review: ? ? Exercise Goals   ? ? [place] Name 09/29/21 1350  ?  ?  ?  ?  ?  ? Exercise Goals  ? Increase Physical Activity Yes      ? Intervention Provide advice, education,  support and counseling about physical activity/exercise needs.;Develop an individualized exercise prescription for aerobic and resistive training based on initial evaluation findings, risk stratification,

## 2021-09-30 DIAGNOSIS — D631 Anemia in chronic kidney disease: Secondary | ICD-10-CM | POA: Diagnosis not present

## 2021-09-30 DIAGNOSIS — N2581 Secondary hyperparathyroidism of renal origin: Secondary | ICD-10-CM | POA: Diagnosis not present

## 2021-09-30 DIAGNOSIS — Z992 Dependence on renal dialysis: Secondary | ICD-10-CM | POA: Diagnosis not present

## 2021-09-30 DIAGNOSIS — N186 End stage renal disease: Secondary | ICD-10-CM | POA: Diagnosis not present

## 2021-09-30 DIAGNOSIS — D509 Iron deficiency anemia, unspecified: Secondary | ICD-10-CM | POA: Diagnosis not present

## 2021-10-01 DIAGNOSIS — D631 Anemia in chronic kidney disease: Secondary | ICD-10-CM | POA: Diagnosis not present

## 2021-10-01 DIAGNOSIS — N186 End stage renal disease: Secondary | ICD-10-CM | POA: Diagnosis not present

## 2021-10-01 DIAGNOSIS — D509 Iron deficiency anemia, unspecified: Secondary | ICD-10-CM | POA: Diagnosis not present

## 2021-10-01 DIAGNOSIS — Z992 Dependence on renal dialysis: Secondary | ICD-10-CM | POA: Diagnosis not present

## 2021-10-01 DIAGNOSIS — N2581 Secondary hyperparathyroidism of renal origin: Secondary | ICD-10-CM | POA: Diagnosis not present

## 2021-10-02 DIAGNOSIS — N186 End stage renal disease: Secondary | ICD-10-CM | POA: Diagnosis not present

## 2021-10-02 DIAGNOSIS — N2581 Secondary hyperparathyroidism of renal origin: Secondary | ICD-10-CM | POA: Diagnosis not present

## 2021-10-02 DIAGNOSIS — D509 Iron deficiency anemia, unspecified: Secondary | ICD-10-CM | POA: Diagnosis not present

## 2021-10-02 DIAGNOSIS — D631 Anemia in chronic kidney disease: Secondary | ICD-10-CM | POA: Diagnosis not present

## 2021-10-02 DIAGNOSIS — Z992 Dependence on renal dialysis: Secondary | ICD-10-CM | POA: Diagnosis not present

## 2021-10-03 ENCOUNTER — Encounter (HOSPITAL_COMMUNITY)
Admission: RE | Admit: 2021-10-03 | Discharge: 2021-10-03 | Disposition: A | Discharge status: Home / Self Care | Payer: Medicare Other | Source: Ambulatory Visit | Attending: Internal Medicine | Admitting: Internal Medicine

## 2021-10-03 DIAGNOSIS — Z955 Presence of coronary angioplasty implant and graft: Secondary | ICD-10-CM | POA: Diagnosis not present

## 2021-10-03 DIAGNOSIS — Z9861 Coronary angioplasty status: Secondary | ICD-10-CM

## 2021-10-03 DIAGNOSIS — Z992 Dependence on renal dialysis: Secondary | ICD-10-CM | POA: Diagnosis not present

## 2021-10-03 DIAGNOSIS — D509 Iron deficiency anemia, unspecified: Secondary | ICD-10-CM | POA: Diagnosis not present

## 2021-10-03 DIAGNOSIS — I214 Non-ST elevation (NSTEMI) myocardial infarction: Secondary | ICD-10-CM | POA: Diagnosis not present

## 2021-10-03 DIAGNOSIS — N2581 Secondary hyperparathyroidism of renal origin: Secondary | ICD-10-CM | POA: Diagnosis not present

## 2021-10-03 DIAGNOSIS — D631 Anemia in chronic kidney disease: Secondary | ICD-10-CM | POA: Diagnosis not present

## 2021-10-03 DIAGNOSIS — N186 End stage renal disease: Secondary | ICD-10-CM | POA: Diagnosis not present

## 2021-10-03 NOTE — Progress Notes (Signed)
Daily Session Note ? ?Patient Details  ?Name: [name] [name] ?MRN: [id number] ?Date of Birth: 11/27/1950 ?Referring Provider:   ?Flowsheet Row CARDIAC REHAB PHASE II ORIENTATION from 09/29/2021 in [place]  ?Referring Provider Dr. Kurtis Bushman  ? ?  ? ? ?Encounter Date: 10/03/2021 ? ?Check In: ? Session Check In - 10/03/21 1100   ? ?  ? Check-In  ? Supervising physician immediately available to respond to emergencies [facility] MD immediately available   ? Physician(s) Dr Domenic Polite   ? Location [facility]   ? Staff Present [name], BS, Exercise Physiologist;Debra Wynetta Emery, RN, Joanette Gula, RN, Madlyn Frankel, RN, BSN   ? Virtual Visit No   ? Medication changes reported     No   ? Fall or balance concerns reported    No   ? Tobacco Cessation No Change   ? Warm-up and Cool-down Performed as group-led instruction   ? Resistance Training Performed No   ? VAD Patient? No   ? PAD/SET Patient? No   ?  ? Pain Assessment  ? Currently in Pain? No/denies   ? Pain Score 1    ? Multiple Pain Sites No   ? ?  ?  ? ?  ? ? ?Capillary Blood Glucose: ?No results found for this or any previous visit (from the past 24 hour(s)). ? ? ? ?Social History  ? ?Tobacco Use  ?Smoking Status Former  ? Packs/day: 1.00  ? Years: 25.00  ? Pack years: 25.00  ? Types: Cigarettes  ? Quit date: 03/27/2010  ? Years since quitting: 11.5  ?Smokeless Tobacco Never  ?Tobacco Comments  ? Quit x 7 years  ? ? ?Goals Met:  ?Independence with exercise equipment ?Exercise tolerated well ?No report of concerns or symptoms today ?Strength training completed today ? ?Goals Unmet:  ?Not Applicable ? ?Comments: checkout 1200 ? ? ? ?Dr. Carlyle Dolly is Medical Director for [place] ?

## 2021-10-04 DIAGNOSIS — D631 Anemia in chronic kidney disease: Secondary | ICD-10-CM | POA: Diagnosis not present

## 2021-10-04 DIAGNOSIS — N2581 Secondary hyperparathyroidism of renal origin: Secondary | ICD-10-CM | POA: Diagnosis not present

## 2021-10-04 DIAGNOSIS — N186 End stage renal disease: Secondary | ICD-10-CM | POA: Diagnosis not present

## 2021-10-04 DIAGNOSIS — D509 Iron deficiency anemia, unspecified: Secondary | ICD-10-CM | POA: Diagnosis not present

## 2021-10-04 DIAGNOSIS — Z992 Dependence on renal dialysis: Secondary | ICD-10-CM | POA: Diagnosis not present

## 2021-10-05 ENCOUNTER — Encounter (HOSPITAL_COMMUNITY)
Admission: RE | Admit: 2021-10-05 | Discharge: 2021-10-05 | Disposition: A | Discharge status: Home / Self Care | Payer: Medicare Other | Source: Ambulatory Visit | Attending: Internal Medicine | Admitting: Internal Medicine

## 2021-10-05 DIAGNOSIS — I214 Non-ST elevation (NSTEMI) myocardial infarction: Secondary | ICD-10-CM

## 2021-10-05 DIAGNOSIS — Z992 Dependence on renal dialysis: Secondary | ICD-10-CM | POA: Diagnosis not present

## 2021-10-05 DIAGNOSIS — Z9861 Coronary angioplasty status: Secondary | ICD-10-CM | POA: Diagnosis not present

## 2021-10-05 DIAGNOSIS — D631 Anemia in chronic kidney disease: Secondary | ICD-10-CM | POA: Diagnosis not present

## 2021-10-05 DIAGNOSIS — N186 End stage renal disease: Secondary | ICD-10-CM | POA: Diagnosis not present

## 2021-10-05 DIAGNOSIS — D509 Iron deficiency anemia, unspecified: Secondary | ICD-10-CM | POA: Diagnosis not present

## 2021-10-05 DIAGNOSIS — Z955 Presence of coronary angioplasty implant and graft: Secondary | ICD-10-CM | POA: Diagnosis not present

## 2021-10-05 DIAGNOSIS — N2581 Secondary hyperparathyroidism of renal origin: Secondary | ICD-10-CM | POA: Diagnosis not present

## 2021-10-05 NOTE — Progress Notes (Signed)
Daily Session Note ? ?Patient Details  ?Name: [name] [name] ?MRN: [id number] ?Date of Birth: [birth year] ?Referring Provider:   ?Flowsheet Row CARDIAC REHAB PHASE II ORIENTATION from 09/29/2021 in [place]  ?Referring Provider Dr. Kurtis Bushman  ? ?  ? ? ?Encounter Date: 10/05/2021 ? ?Check In: ? Session Check In - 10/05/21 1100   ? ?  ? Check-In  ? Supervising physician immediately available to respond to emergencies [facility] MD immediately available   ? Physician(s) Dr. Domenic Polite   ? Location [facility]   ? Staff Present Geanie Cooley, RN;Heather Otho Ket, BS, Exercise Physiologist;Dalton Fletcher, MS, ACSM-CEP, Exercise Physiologist   ? Virtual Visit No   ? Medication changes reported     No   ? Fall or balance concerns reported    No   ? Tobacco Cessation No Change   ? Warm-up and Cool-down Performed as group-led instruction   ? Resistance Training Performed Yes   ? VAD Patient? No   ? PAD/SET Patient? No   ?  ? Pain Assessment  ? Currently in Pain? No/denies   ? Multiple Pain Sites No   ? ?  ?  ? ?  ? ? ?Capillary Blood Glucose: ?No results found for this or any previous visit (from the past 24 hour(s)). ? ? ? ?Social History  ? ?Tobacco Use  ?Smoking Status Former  ? Packs/day: 1.00  ? Years: 25.00  ? Pack years: 25.00  ? Types: Cigarettes  ? Quit date: 03/27/2010  ? Years since quitting: 11.5  ?Smokeless Tobacco Never  ?Tobacco Comments  ? Quit x 7 years  ? ? ?Goals Met:  ?Independence with exercise equipment ?Exercise tolerated well ?No report of concerns or symptoms today ?Strength training completed today ? ?Goals Unmet:  ?Not Applicable ? ?Comments: check out @ 12:00pm ? ? ?Dr. Carlyle Dolly is Medical Director for [place] ?

## 2021-10-06 DIAGNOSIS — D631 Anemia in chronic kidney disease: Secondary | ICD-10-CM | POA: Diagnosis not present

## 2021-10-06 DIAGNOSIS — D509 Iron deficiency anemia, unspecified: Secondary | ICD-10-CM | POA: Diagnosis not present

## 2021-10-06 DIAGNOSIS — Z992 Dependence on renal dialysis: Secondary | ICD-10-CM | POA: Diagnosis not present

## 2021-10-06 DIAGNOSIS — N2581 Secondary hyperparathyroidism of renal origin: Secondary | ICD-10-CM | POA: Diagnosis not present

## 2021-10-06 DIAGNOSIS — N186 End stage renal disease: Secondary | ICD-10-CM | POA: Diagnosis not present

## 2021-10-07 ENCOUNTER — Encounter (HOSPITAL_COMMUNITY)
Admission: RE | Admit: 2021-10-07 | Discharge: 2021-10-07 | Disposition: A | Discharge status: Home / Self Care | Payer: Medicare Other | Source: Ambulatory Visit | Attending: Internal Medicine | Admitting: Internal Medicine

## 2021-10-07 DIAGNOSIS — Z955 Presence of coronary angioplasty implant and graft: Secondary | ICD-10-CM | POA: Diagnosis not present

## 2021-10-07 DIAGNOSIS — I214 Non-ST elevation (NSTEMI) myocardial infarction: Secondary | ICD-10-CM

## 2021-10-07 DIAGNOSIS — D631 Anemia in chronic kidney disease: Secondary | ICD-10-CM | POA: Diagnosis not present

## 2021-10-07 DIAGNOSIS — Z9861 Coronary angioplasty status: Secondary | ICD-10-CM

## 2021-10-07 DIAGNOSIS — Z992 Dependence on renal dialysis: Secondary | ICD-10-CM | POA: Diagnosis not present

## 2021-10-07 DIAGNOSIS — D509 Iron deficiency anemia, unspecified: Secondary | ICD-10-CM | POA: Diagnosis not present

## 2021-10-07 DIAGNOSIS — N2581 Secondary hyperparathyroidism of renal origin: Secondary | ICD-10-CM | POA: Diagnosis not present

## 2021-10-07 DIAGNOSIS — N186 End stage renal disease: Secondary | ICD-10-CM | POA: Diagnosis not present

## 2021-10-07 NOTE — Progress Notes (Signed)
I have reviewed a Home Exercise Prescription with [name] . Hyde is currently exercising at home.  The patient was advised to walk 2 days a week for 30-45 minutes.  Adolf and I discussed how to progress their exercise prescription.  The patient stated that their goals were lose weight.  The patient stated that they understand the exercise prescription.  We reviewed exercise guidelines, target heart rate during exercise, RPE Scale, weather conditions, NTG use, endpoints for exercise, warmup and cool down.  Patient is encouraged to come to me with any questions. I will continue to follow up with the patient to assist them with progression and safety.    ?

## 2021-10-07 NOTE — Progress Notes (Signed)
Daily Session Note ? ?Patient Details  ?Name: [name] [name] ?MRN: [id number] ?Date of Birth: 12-19-1950 ?Referring Provider:   ?Flowsheet Row CARDIAC REHAB PHASE II ORIENTATION from 09/29/2021 in [place]  ?Referring Provider Dr. Kurtis Bushman  ? ?  ? ? ?Encounter Date: 10/07/2021 ? ?Check In: ? Session Check In - 10/07/21 1100   ? ?  ? Check-In  ? Supervising physician immediately available to respond to emergencies [facility] MD immediately available   ? Physician(s) Dr Gardiner Rhyme   ? Location [facility]   ? Staff Present [name], BS, Exercise Physiologist;Christy Oletta Lamas, RN, Joanette Gula, RN, BSN   ? Virtual Visit No   ? Medication changes reported     No   ? Fall or balance concerns reported    No   ? Tobacco Cessation No Change   ? Warm-up and Cool-down Performed as group-led instruction   ? Resistance Training Performed Yes   ? VAD Patient? No   ?  ? Pain Assessment  ? Currently in Pain? No/denies   ? Pain Score 0-No pain   ? Multiple Pain Sites No   ? ?  ?  ? ?  ? ? ?Capillary Blood Glucose: ?No results found for this or any previous visit (from the past 24 hour(s)). ? ? ? ?Social History  ? ?Tobacco Use  ?Smoking Status Former  ? Packs/day: 1.00  ? Years: 25.00  ? Pack years: 25.00  ? Types: Cigarettes  ? Quit date: 03/27/2010  ? Years since quitting: 11.5  ?Smokeless Tobacco Never  ?Tobacco Comments  ? Quit x 7 years  ? ? ?Goals Met:  ?Independence with exercise equipment ?Exercise tolerated well ?No report of concerns or symptoms today ?Strength training completed today ? ?Goals Unmet:  ?Not Applicable ? ?Comments: Check out 1200. ? ? ?Dr. Carlyle Dolly is Medical Director for [place] ?

## 2021-10-08 DIAGNOSIS — N186 End stage renal disease: Secondary | ICD-10-CM | POA: Diagnosis not present

## 2021-10-08 DIAGNOSIS — D631 Anemia in chronic kidney disease: Secondary | ICD-10-CM | POA: Diagnosis not present

## 2021-10-08 DIAGNOSIS — D509 Iron deficiency anemia, unspecified: Secondary | ICD-10-CM | POA: Diagnosis not present

## 2021-10-08 DIAGNOSIS — N2581 Secondary hyperparathyroidism of renal origin: Secondary | ICD-10-CM | POA: Diagnosis not present

## 2021-10-08 DIAGNOSIS — Z992 Dependence on renal dialysis: Secondary | ICD-10-CM | POA: Diagnosis not present

## 2021-10-09 DIAGNOSIS — N2581 Secondary hyperparathyroidism of renal origin: Secondary | ICD-10-CM | POA: Diagnosis not present

## 2021-10-09 DIAGNOSIS — D631 Anemia in chronic kidney disease: Secondary | ICD-10-CM | POA: Diagnosis not present

## 2021-10-09 DIAGNOSIS — N186 End stage renal disease: Secondary | ICD-10-CM | POA: Diagnosis not present

## 2021-10-09 DIAGNOSIS — D509 Iron deficiency anemia, unspecified: Secondary | ICD-10-CM | POA: Diagnosis not present

## 2021-10-09 DIAGNOSIS — Z992 Dependence on renal dialysis: Secondary | ICD-10-CM | POA: Diagnosis not present

## 2021-10-10 ENCOUNTER — Encounter (HOSPITAL_COMMUNITY)
Admission: RE | Admit: 2021-10-10 | Discharge: 2021-10-10 | Disposition: A | Discharge status: Home / Self Care | Payer: Medicare Other | Source: Ambulatory Visit | Attending: Internal Medicine | Admitting: Internal Medicine

## 2021-10-10 VITALS — Wt 233.2 lb

## 2021-10-10 DIAGNOSIS — I214 Non-ST elevation (NSTEMI) myocardial infarction: Secondary | ICD-10-CM | POA: Diagnosis not present

## 2021-10-10 DIAGNOSIS — N186 End stage renal disease: Secondary | ICD-10-CM | POA: Diagnosis not present

## 2021-10-10 DIAGNOSIS — Z955 Presence of coronary angioplasty implant and graft: Secondary | ICD-10-CM | POA: Diagnosis not present

## 2021-10-10 DIAGNOSIS — D631 Anemia in chronic kidney disease: Secondary | ICD-10-CM | POA: Diagnosis not present

## 2021-10-10 DIAGNOSIS — N2581 Secondary hyperparathyroidism of renal origin: Secondary | ICD-10-CM | POA: Diagnosis not present

## 2021-10-10 DIAGNOSIS — Z9861 Coronary angioplasty status: Secondary | ICD-10-CM | POA: Diagnosis not present

## 2021-10-10 DIAGNOSIS — Z992 Dependence on renal dialysis: Secondary | ICD-10-CM | POA: Diagnosis not present

## 2021-10-10 DIAGNOSIS — D509 Iron deficiency anemia, unspecified: Secondary | ICD-10-CM | POA: Diagnosis not present

## 2021-10-10 NOTE — Progress Notes (Signed)
Daily Session Note ? ?Patient Details  ?Name: [name] [name] ?MRN: [id number] ?Date of Birth: [birth year] ?Referring Provider:   ?Flowsheet Row CARDIAC REHAB PHASE II ORIENTATION from 09/29/2021 in [place]  ?Referring Provider Dr. Kurtis Bushman  ? ?  ? ? ?Encounter Date: 10/10/2021 ? ?Check In: ? Session Check In - 10/10/21 1100   ? ?  ? Check-In  ? Supervising physician immediately available to respond to emergencies [facility] MD immediately available   ? Physician(s) Dr Marlou Porch   ? Location [facility]   ? Staff Present Madelyn Flavors, RN, Bjorn Loser, MS, ACSM-CEP, Exercise Physiologist;Heather Zigmund Daniel, Exercise Physiologist   ? Virtual Visit No   ? Medication changes reported     No   ? Fall or balance concerns reported    No   ? Tobacco Cessation No Change   ? Warm-up and Cool-down Performed as group-led instruction   ? Resistance Training Performed Yes   ? VAD Patient? No   ? PAD/SET Patient? No   ?  ? Pain Assessment  ? Currently in Pain? No/denies   ? Pain Score 0-No pain   ? Multiple Pain Sites No   ? ?  ?  ? ?  ? ? ?Capillary Blood Glucose: ?No results found for this or any previous visit (from the past 24 hour(s)). ? ? ? ?Social History  ? ?Tobacco Use  ?Smoking Status Former  ? Packs/day: 1.00  ? Years: 25.00  ? Pack years: 25.00  ? Types: Cigarettes  ? Quit date: 03/27/2010  ? Years since quitting: 11.5  ?Smokeless Tobacco Never  ?Tobacco Comments  ? Quit x 7 years  ? ? ?Goals Met:  ?Independence with exercise equipment ?Exercise tolerated well ?No report of concerns or symptoms today ?Strength training completed today ? ?Goals Unmet:  ?Not Applicable ? ?Comments: Check out at 1200. ? ? ?Dr. Carlyle Dolly is Medical Director for [place] ?

## 2021-10-11 DIAGNOSIS — N2581 Secondary hyperparathyroidism of renal origin: Secondary | ICD-10-CM | POA: Diagnosis not present

## 2021-10-11 DIAGNOSIS — D631 Anemia in chronic kidney disease: Secondary | ICD-10-CM | POA: Diagnosis not present

## 2021-10-11 DIAGNOSIS — Z992 Dependence on renal dialysis: Secondary | ICD-10-CM | POA: Diagnosis not present

## 2021-10-11 DIAGNOSIS — D509 Iron deficiency anemia, unspecified: Secondary | ICD-10-CM | POA: Diagnosis not present

## 2021-10-11 DIAGNOSIS — N186 End stage renal disease: Secondary | ICD-10-CM | POA: Diagnosis not present

## 2021-10-12 ENCOUNTER — Encounter (HOSPITAL_COMMUNITY)
Admission: RE | Admit: 2021-10-12 | Discharge: 2021-10-12 | Disposition: A | Discharge status: Home / Self Care | Payer: Medicare Other | Source: Ambulatory Visit | Attending: Internal Medicine | Admitting: Internal Medicine

## 2021-10-12 DIAGNOSIS — N186 End stage renal disease: Secondary | ICD-10-CM | POA: Diagnosis not present

## 2021-10-12 DIAGNOSIS — D631 Anemia in chronic kidney disease: Secondary | ICD-10-CM | POA: Diagnosis not present

## 2021-10-12 DIAGNOSIS — D509 Iron deficiency anemia, unspecified: Secondary | ICD-10-CM | POA: Diagnosis not present

## 2021-10-12 DIAGNOSIS — Z9861 Coronary angioplasty status: Secondary | ICD-10-CM | POA: Diagnosis not present

## 2021-10-12 DIAGNOSIS — I214 Non-ST elevation (NSTEMI) myocardial infarction: Secondary | ICD-10-CM

## 2021-10-12 DIAGNOSIS — Z992 Dependence on renal dialysis: Secondary | ICD-10-CM | POA: Diagnosis not present

## 2021-10-12 DIAGNOSIS — Z955 Presence of coronary angioplasty implant and graft: Secondary | ICD-10-CM | POA: Diagnosis not present

## 2021-10-12 DIAGNOSIS — N2581 Secondary hyperparathyroidism of renal origin: Secondary | ICD-10-CM | POA: Diagnosis not present

## 2021-10-12 NOTE — Progress Notes (Signed)
Daily Session Note ? ?Patient Details  ?Name: [name] [name] ?MRN: [id number] ?Date of Birth: [birth year] ?Referring Provider:   ?Flowsheet Row CARDIAC REHAB PHASE II ORIENTATION from 09/29/2021 in [place]  ?Referring Provider Dr. Kurtis Bushman  ? ?  ? ? ?Encounter Date: 10/12/2021 ? ?Check In: ? Session Check In - 10/12/21 1100   ? ?  ? Check-In  ? Supervising physician immediately available to respond to emergencies [facility] MD immediately available   ? Physician(s) Dr Harl Bowie   ? Location [facility]   ? Staff Present [name], BS, Exercise Physiologist;Dalton Kris Mouton, MS, ACSM-CEP, Exercise Physiologist;Danny Russella Dar, RN, Joanette Gula, RN, BSN   ? Virtual Visit No   ? Medication changes reported     No   ? Fall or balance concerns reported    No   ? Tobacco Cessation No Change   ? Warm-up and Cool-down Performed as group-led instruction   ? Resistance Training Performed Yes   ? VAD Patient? No   ? PAD/SET Patient? No   ?  ? Pain Assessment  ? Currently in Pain? No/denies   ? Pain Score 0-No pain   ? Multiple Pain Sites No   ? ?  ?  ? ?  ? ? ?Capillary Blood Glucose: ?No results found for this or any previous visit (from the past 24 hour(s)). ? ? ? ?Social History  ? ?Tobacco Use  ?Smoking Status Former  ? Packs/day: 1.00  ? Years: 25.00  ? Pack years: 25.00  ? Types: Cigarettes  ? Quit date: 03/27/2010  ? Years since quitting: 11.5  ?Smokeless Tobacco Never  ?Tobacco Comments  ? Quit x 7 years  ? ? ?Goals Met:  ?Independence with exercise equipment ?Exercise tolerated well ?No report of concerns or symptoms today ?Strength training completed today ? ?Goals Unmet:  ?Not Applicable ? ?Comments: Checkout at 1200. ? ? ?Dr. Carlyle Dolly is Medical Director for [place] ?

## 2021-10-12 NOTE — Progress Notes (Signed)
Cardiac Individual Treatment Plan ? ?Patient Details  ?Name: [name] [name] ?MRN: [id number] ?Date of Birth: Jun 22, 1951 ?Referring Provider:   ?Flowsheet Row CARDIAC REHAB PHASE II ORIENTATION from 09/29/2021 in [facility]  ?Referring Provider Dr. Kurtis Bushman  ? ?  ? ? ?Initial Encounter Date:  ?Flowsheet Row CARDIAC REHAB PHASE II ORIENTATION from 09/29/2021 in [facility]  ?Date 09/29/21  ? ?  ? ? ?Visit Diagnosis: S/P coronary artery stent placement ? ?NSTEMI (non-ST elevated myocardial infarction) ([place]) ? ?S/P PTCA (percutaneous transluminal coronary angioplasty) ? ?Patient's Home Medications on Admission: ? ?Current Outpatient Medications:  ?  albuterol (VENTOLIN HFA) 108 (90 Base) MCG/ACT inhaler, Inhale 2 puffs into the lungs every 6 (six) hours as needed for wheezing or shortness of breath., Disp: 18 g, Rfl: 11 ?  aspirin EC 81 MG tablet, Take 81 mg by mouth daily. Swallow whole., Disp: , Rfl:  ?  benzonatate (TESSALON) 100 MG capsule, Take 1 capsule (100 mg total) by mouth every 8 (eight) hours., Disp: 21 capsule, Rfl: 0 ?  budesonide-formoterol (SYMBICORT) 160-4.5 MCG/ACT inhaler, Inhale 2 puffs into the lungs 2 (two) times daily., Disp: 1 each, Rfl: 6 ?  calcitRIOL (ROCALTROL) 0.25 MCG capsule, Take 0.25 mcg by mouth daily., Disp: , Rfl:  ?  glucose blood test strip, 1 each by Other route 2 (two) times daily. Use as instructed to check blood glucose twice daily, Disp: 200 each, Rfl: 2 ?  HYDROcodone-acetaminophen (NORCO/VICODIN) 5-325 MG tablet, Take 2 tablets by mouth every 4 (four) hours as needed. (Patient not taking: Reported on 09/29/2021), Disp: 6 tablet, Rfl: 0 ?  hydrocortisone (ANUSOL-HC) 2.5 % rectal cream, Place 1 application rectally 2 (two) times daily. (Patient not taking: Reported on 09/29/2021), Disp: 30 g, Rfl: 1 ?  Insulin Pen Needle (DROPLET PEN NEEDLES) 31G X 5 MM MISC, Use to inject insulin daily as directed, Disp: 150 each, Rfl: 2 ?  LANTUS SOLOSTAR  100 UNIT/ML Solostar Pen, INJECT  30 UNITS SUBCUTANEOUSLY AT BEDTIME (Patient taking differently: 30 Units at bedtime.), Disp: 30 mL, Rfl: 1 ?  loperamide HCl (IMODIUM) 1 MG/7.5ML suspension, Take 7.5 mLs (1 mg total) by mouth as needed for diarrhea or loose stools., Disp: 120 mL, Rfl: 0 ?  losartan (COZAAR) 25 MG tablet, Take 1 tablet (25 mg total) by mouth daily., Disp: 30 tablet, Rfl: 6 ?  metoprolol succinate (TOPROL-XL) 25 MG 24 hr tablet, Take 25 mg by mouth daily., Disp: , Rfl:  ?  pantoprazole (PROTONIX) 40 MG tablet, Take 1 tablet (40 mg total) by mouth 2 (two) times daily before a meal., Disp: 60 tablet, Rfl: 5 ?  simvastatin (ZOCOR) 20 MG tablet, Take 20 mg by mouth every morning., Disp: , Rfl:  ?  ticagrelor (BRILINTA) 90 MG TABS tablet, Take 1 tablet (90 mg total) by mouth 2 (two) times daily., Disp: 60 tablet, Rfl: 3 ?  torsemide (DEMADEX) 100 MG tablet, Take 100 mg by mouth daily., Disp: , Rfl:  ?  TRADJENTA 5 MG TABS tablet, TAKE 1 TABLET EVERY DAY, Disp: 90 tablet, Rfl: 1 ?  Vitamin D, Ergocalciferol, (DRISDOL) 1.25 MG (50000 UNIT) CAPS capsule, Take 50,000 Units by mouth every 7 (seven) days., Disp: , Rfl:  ? ?Past Medical History: ?Past Medical History:  ?Diagnosis Date  ? Anemia   ? Arthritis   ? Asthma   ? Bell palsy   ? CAD (coronary artery disease)   ? a. 2014 MV: abnl w/ infap ischemia;  b. 03/2013 Cath: aneurysmal bleb in the LAD w/ otw nonobs dzs-->Med Rx.  ? Chronic back pain   ? Chronic knee pain   ? a. 09/2015 s/p R TKA.  ? Chronic pain   ? Chronic shoulder pain   ? Chronic sinusitis   ? CKD (chronic kidney disease), stage IV ([place])   ? stage 4 per office visit note of Dr Lowanda Foster on 05/2015   ? COPD (chronic obstructive pulmonary disease) ([place])   ? Diabetes mellitus without complication ([place])   ? type II   ? Essential hypertension   ? GERD (gastroesophageal reflux disease)   ? Gout   ? Gout   ? Hepatomegaly   ? noted on noncontrast CT 2015  ? History of hiatal hernia   ? Hyperlipidemia   ?  Lateral meniscus tear   ? Obesity   ? Truncal  ? Obstructive sleep apnea   ? does not use cpap   ? On home oxygen therapy   ? uses 2l when is going somewhere per patient   ? PUD (peptic ulcer disease)   ? remote, reports f/u EGD about 8 years ago unremarkable   ? Reactive airway disease   ? related to exposure to chemical during 9/11  ? Renal insufficiency   ? Sinusitis   ? Vitamin D deficiency   ? ? ?Tobacco Use: ?Social History  ? ?Tobacco Use  ?Smoking Status Former  ? Packs/day: 1.00  ? Years: 25.00  ? Pack years: 25.00  ? Types: Cigarettes  ? Quit date: 03/27/2010  ? Years since quitting: 11.5  ?Smokeless Tobacco Never  ?Tobacco Comments  ? Quit x 7 years  ? ? ?Labs: ?Review Flowsheet   ? ?  ?  Latest Ref Rng & Units 06/16/2020 12/15/2020 06/10/2021 06/16/2021  ?Labs for ITP Cardiac and Pulmonary Rehab  ?Cholestrol 100 - 199 mg/dL   104     ?LDL (calc) 0 - 99 mg/dL   53     ?HDL-C >39 mg/dL   30     ?Trlycerides 0 - 149 mg/dL   110     ?Hemoglobin A1c 4.8 - 5.6 % 6.1   6.2    6.2    ? ?  07/22/2021  ?Labs for ITP Cardiac and Pulmonary Rehab  ?Cholestrol   ?LDL (calc)   ?HDL-C   ?Trlycerides   ?Hemoglobin A1c 6.6    ?  ? ? Multiple values from one day are sorted in reverse-chronological order  ?  ?  ? ? ?Capillary Blood Glucose: ?Lab Results  ?Component Value Date  ? GLUCAP 118 (H) 07/27/2021  ? GLUCAP 161 (H) 07/26/2021  ? GLUCAP 107 (H) 07/26/2021  ? GLUCAP 126 (H) 07/26/2021  ? GLUCAP 104 (H) 07/26/2021  ? ? ? ?Exercise Target Goals: ?Exercise Program Goal: ?Individual exercise prescription set using results from initial 6 min walk test and THRR while considering  patient?s activity barriers and safety.  ? ?Exercise Prescription Goal: ?Starting with aerobic activity 30 plus minutes a day, 3 days per week for initial exercise prescription. Provide home exercise prescription and guidelines that participant acknowledges understanding prior to discharge. ? ?Activity Barriers & Risk Stratification: ? Activity Barriers &  Cardiac Risk Stratification - 09/29/21 1242   ? ?  ? Activity Barriers & Cardiac Risk Stratification  ? Activity Barriers Arthritis;Neck/Spine Problems;Back Problems;Shortness of Breath   ? Cardiac Risk Stratification High   ? ?  ?  ? ?  ? ? ?6 Minute Walk: ?  6 Minute Walk   ? ? [place] Name 09/29/21 1339  ?  ?  ?  ? 6 Minute Walk  ? Phase Initial    ? Distance 1150 feet    ? Walk Time 6 minutes    ? # of Rest Breaks 0    ? MPH 2.17    ? METS 1.97    ? RPE 12    ? VO2 Peak 6.92    ? Symptoms No    ? Resting HR 65 bpm    ? Resting BP 118/72    ? Resting Oxygen Saturation  98 %    ? Exercise Oxygen Saturation  during 6 min walk 94 %    ? Max Ex. HR 108 bpm    ? Max Ex. BP 142/60    ? 2 Minute Post BP 120/60    ? ?  ?  ? ?  ? ? ?Oxygen Initial Assessment: ? ? ?Oxygen Re-Evaluation: ? ? ?Oxygen Discharge (Final Oxygen Re-Evaluation): ? ? ?Initial Exercise Prescription: ? Initial Exercise Prescription - 09/29/21 1300   ? ?  ? Date of Initial Exercise RX and Referring Provider  ? Date 09/29/21   ? Referring Provider Dr. Kurtis Bushman   ? Expected Discharge Date 12/23/21   ?  ? Treadmill  ? MPH 1.3   ? Grade 0   ? Minutes 17   ?  ? NuStep  ? Level 1   ? SPM 60   ? Minutes 22   ?  ? Prescription Details  ? Frequency (times per week) 3   ? Duration Progress to 30 minutes of continuous aerobic without signs/symptoms of physical distress   ?  ? Intensity  ? THRR 40-80% of Max Heartrate 60-119   ? Ratings of Perceived Exertion 11-13   ?  ? Progression  ? Progression Continue progressive overload as per policy without signs/symptoms or physical distress.   ?  ? Resistance Training  ? Training Prescription Yes   ? Weight 3   ? Reps 10-15   ? ?  ?  ? ?  ? ? ?Perform Capillary Blood Glucose checks as needed. ? ?Exercise Prescription Changes: ? ? Exercise Prescription Changes   ? ? [place] Name 10/07/21 1100 10/10/21 1400  ?  ?  ?  ?  ? Response to Exercise  ? Blood Pressure (Admit) -- 126/70     ? Blood Pressure (Exercise) -- 148/74     ? Blood  Pressure (Exit) -- 108/80     ? Heart Rate (Admit) -- 64 bpm     ? Heart Rate (Exercise) -- 121 bpm     ? Heart Rate (Exit) -- 73 bpm     ? Rating of Perceived Exertion (Exercise) -- 10     ? Duration -

## 2021-10-13 DIAGNOSIS — N2581 Secondary hyperparathyroidism of renal origin: Secondary | ICD-10-CM | POA: Diagnosis not present

## 2021-10-13 DIAGNOSIS — D631 Anemia in chronic kidney disease: Secondary | ICD-10-CM | POA: Diagnosis not present

## 2021-10-13 DIAGNOSIS — N186 End stage renal disease: Secondary | ICD-10-CM | POA: Diagnosis not present

## 2021-10-13 DIAGNOSIS — Z992 Dependence on renal dialysis: Secondary | ICD-10-CM | POA: Diagnosis not present

## 2021-10-13 DIAGNOSIS — D509 Iron deficiency anemia, unspecified: Secondary | ICD-10-CM | POA: Diagnosis not present

## 2021-10-14 ENCOUNTER — Encounter (HOSPITAL_COMMUNITY)
Admission: RE | Admit: 2021-10-14 | Discharge: 2021-10-14 | Disposition: A | Discharge status: Home / Self Care | Payer: Medicare Other | Source: Ambulatory Visit | Attending: Internal Medicine | Admitting: Internal Medicine

## 2021-10-14 DIAGNOSIS — D509 Iron deficiency anemia, unspecified: Secondary | ICD-10-CM | POA: Diagnosis not present

## 2021-10-14 DIAGNOSIS — Z955 Presence of coronary angioplasty implant and graft: Secondary | ICD-10-CM | POA: Diagnosis not present

## 2021-10-14 DIAGNOSIS — Z9861 Coronary angioplasty status: Secondary | ICD-10-CM | POA: Diagnosis not present

## 2021-10-14 DIAGNOSIS — I214 Non-ST elevation (NSTEMI) myocardial infarction: Secondary | ICD-10-CM

## 2021-10-14 DIAGNOSIS — Z992 Dependence on renal dialysis: Secondary | ICD-10-CM | POA: Diagnosis not present

## 2021-10-14 DIAGNOSIS — N2581 Secondary hyperparathyroidism of renal origin: Secondary | ICD-10-CM | POA: Diagnosis not present

## 2021-10-14 DIAGNOSIS — N186 End stage renal disease: Secondary | ICD-10-CM | POA: Diagnosis not present

## 2021-10-14 DIAGNOSIS — D631 Anemia in chronic kidney disease: Secondary | ICD-10-CM | POA: Diagnosis not present

## 2021-10-14 NOTE — Progress Notes (Signed)
Daily Session Note ? ?Patient Details  ?Name: [name] [name] ?MRN: [id number] ?Date of Birth: 1951/07/06 ?Referring Provider:   ?Flowsheet Row CARDIAC REHAB PHASE II ORIENTATION from 09/29/2021 in [facility]  ?Referring Provider Dr. Kurtis Bushman  ? ?  ? ? ?Encounter Date: 10/14/2021 ? ?Check In: ? Session Check In - 10/14/21 1100   ? ?  ? Check-In  ? Supervising physician immediately available to respond to emergencies [facility] MD immediately available   ? Physician(s) Dr Harl Bowie   ? Location [facility]   ? Staff Present Aundra Dubin, RN, Joanette Gula, RN, BSN;Christy Edwards, RN, Bjorn Loser, MS, ACSM-CEP, Exercise Physiologist   ? Virtual Visit No   ? Medication changes reported     No   ? Fall or balance concerns reported    No   ? Tobacco Cessation No Change   ? Warm-up and Cool-down Performed as group-led instruction   ? Resistance Training Performed Yes   ? VAD Patient? No   ? PAD/SET Patient? No   ?  ? Pain Assessment  ? Currently in Pain? No/denies   ? Pain Score 0-No pain   ? Multiple Pain Sites No   ? ?  ?  ? ?  ? ? ?Capillary Blood Glucose: ?No results found for this or any previous visit (from the past 24 hour(s)). ? ? ? ?Social History  ? ?Tobacco Use  ?Smoking Status Former  ? Packs/day: 1.00  ? Years: 25.00  ? Pack years: 25.00  ? Types: Cigarettes  ? Quit date: 03/27/2010  ? Years since quitting: 11.5  ?Smokeless Tobacco Never  ?Tobacco Comments  ? Quit x 7 years  ? ? ?Goals Met:  ?Independence with exercise equipment ?Exercise tolerated well ?No report of concerns or symptoms today ?Strength training completed today ? ?Goals Unmet:  ?Not Applicable ? ?Comments: Check out 1200. ? ? ?Dr. Carlyle Dolly is Medical Director for [place] ?

## 2021-10-15 DIAGNOSIS — D631 Anemia in chronic kidney disease: Secondary | ICD-10-CM | POA: Diagnosis not present

## 2021-10-15 DIAGNOSIS — N250 Renal osteodystrophy: Secondary | ICD-10-CM | POA: Diagnosis not present

## 2021-10-15 DIAGNOSIS — Z992 Dependence on renal dialysis: Secondary | ICD-10-CM | POA: Diagnosis not present

## 2021-10-15 DIAGNOSIS — N186 End stage renal disease: Secondary | ICD-10-CM | POA: Diagnosis not present

## 2021-10-15 DIAGNOSIS — D509 Iron deficiency anemia, unspecified: Secondary | ICD-10-CM | POA: Diagnosis not present

## 2021-10-16 DIAGNOSIS — N186 End stage renal disease: Secondary | ICD-10-CM | POA: Diagnosis not present

## 2021-10-16 DIAGNOSIS — D631 Anemia in chronic kidney disease: Secondary | ICD-10-CM | POA: Diagnosis not present

## 2021-10-16 DIAGNOSIS — N250 Renal osteodystrophy: Secondary | ICD-10-CM | POA: Diagnosis not present

## 2021-10-16 DIAGNOSIS — D509 Iron deficiency anemia, unspecified: Secondary | ICD-10-CM | POA: Diagnosis not present

## 2021-10-16 DIAGNOSIS — Z992 Dependence on renal dialysis: Secondary | ICD-10-CM | POA: Diagnosis not present

## 2021-10-17 ENCOUNTER — Encounter (HOSPITAL_COMMUNITY): Payer: Medicare Other

## 2021-10-17 DIAGNOSIS — D631 Anemia in chronic kidney disease: Secondary | ICD-10-CM | POA: Diagnosis not present

## 2021-10-17 DIAGNOSIS — D509 Iron deficiency anemia, unspecified: Secondary | ICD-10-CM | POA: Diagnosis not present

## 2021-10-17 DIAGNOSIS — E119 Type 2 diabetes mellitus without complications: Secondary | ICD-10-CM | POA: Diagnosis not present

## 2021-10-17 DIAGNOSIS — Z992 Dependence on renal dialysis: Secondary | ICD-10-CM | POA: Diagnosis not present

## 2021-10-17 DIAGNOSIS — Z794 Long term (current) use of insulin: Secondary | ICD-10-CM | POA: Diagnosis not present

## 2021-10-17 DIAGNOSIS — N186 End stage renal disease: Secondary | ICD-10-CM | POA: Diagnosis not present

## 2021-10-17 DIAGNOSIS — N250 Renal osteodystrophy: Secondary | ICD-10-CM | POA: Diagnosis not present

## 2021-10-18 DIAGNOSIS — D509 Iron deficiency anemia, unspecified: Secondary | ICD-10-CM | POA: Diagnosis not present

## 2021-10-18 DIAGNOSIS — Z992 Dependence on renal dialysis: Secondary | ICD-10-CM | POA: Diagnosis not present

## 2021-10-18 DIAGNOSIS — N250 Renal osteodystrophy: Secondary | ICD-10-CM | POA: Diagnosis not present

## 2021-10-18 DIAGNOSIS — N186 End stage renal disease: Secondary | ICD-10-CM | POA: Diagnosis not present

## 2021-10-18 DIAGNOSIS — D631 Anemia in chronic kidney disease: Secondary | ICD-10-CM | POA: Diagnosis not present

## 2021-10-19 ENCOUNTER — Encounter (HOSPITAL_COMMUNITY)
Admission: RE | Admit: 2021-10-19 | Discharge: 2021-10-19 | Disposition: A | Discharge status: Home / Self Care | Payer: Medicare Other | Source: Ambulatory Visit | Attending: Internal Medicine | Admitting: Internal Medicine

## 2021-10-19 DIAGNOSIS — Z992 Dependence on renal dialysis: Secondary | ICD-10-CM | POA: Diagnosis not present

## 2021-10-19 DIAGNOSIS — Z9861 Coronary angioplasty status: Secondary | ICD-10-CM | POA: Insufficient documentation

## 2021-10-19 DIAGNOSIS — I214 Non-ST elevation (NSTEMI) myocardial infarction: Secondary | ICD-10-CM | POA: Insufficient documentation

## 2021-10-19 DIAGNOSIS — N250 Renal osteodystrophy: Secondary | ICD-10-CM | POA: Diagnosis not present

## 2021-10-19 DIAGNOSIS — Z955 Presence of coronary angioplasty implant and graft: Secondary | ICD-10-CM | POA: Diagnosis not present

## 2021-10-19 DIAGNOSIS — N186 End stage renal disease: Secondary | ICD-10-CM | POA: Diagnosis not present

## 2021-10-19 DIAGNOSIS — D509 Iron deficiency anemia, unspecified: Secondary | ICD-10-CM | POA: Diagnosis not present

## 2021-10-19 DIAGNOSIS — D631 Anemia in chronic kidney disease: Secondary | ICD-10-CM | POA: Diagnosis not present

## 2021-10-19 NOTE — Progress Notes (Signed)
Daily Session Note ? ?Patient Details  ?Name: [name] [name] ?MRN: [id number] ?Date of Birth: March 15, 1951 ?Referring Provider:   ?Flowsheet Row CARDIAC REHAB PHASE II ORIENTATION from 09/29/2021 in [facility]  ?Referring Provider Dr. Kurtis Bushman  ? ?  ? ? ?Encounter Date: 10/19/2021 ? ?Check In: ? Session Check In - 10/19/21 1100   ? ?  ? Check-In  ? Supervising physician immediately available to respond to emergencies [facility] MD immediately available   ? Physician(s) Dr Harl Bowie   ? Location [facility]   ? Staff Present Aundra Dubin, RN, Joanette Gula, RN, BSN;Heather Otho Ket, BS, Exercise Physiologist   ? Virtual Visit No   ? Medication changes reported     No   ? Fall or balance concerns reported    No   ? Tobacco Cessation No Change   ? Warm-up and Cool-down Performed as group-led instruction   ? Resistance Training Performed Yes   ? VAD Patient? No   ? PAD/SET Patient? No   ?  ? Pain Assessment  ? Currently in Pain? No/denies   ? Pain Score 0-No pain   ? Multiple Pain Sites No   ? ?  ?  ? ?  ? ? ?Capillary Blood Glucose: ?No results found for this or any previous visit (from the past 24 hour(s)). ? ? ? ?Social History  ? ?Tobacco Use  ?Smoking Status Former  ? Packs/day: 1.00  ? Years: 25.00  ? Pack years: 25.00  ? Types: Cigarettes  ? Quit date: 03/27/2010  ? Years since quitting: 11.5  ?Smokeless Tobacco Never  ?Tobacco Comments  ? Quit x 7 years  ? ? ?Goals Met:  ?Independence with exercise equipment ?Exercise tolerated well ?No report of concerns or symptoms today ?Strength training completed today ? ?Goals Unmet:  ?Not Applicable ? ?Comments: Check out 1200. ? ? ?Dr. Carlyle Dolly is Medical Director for [place] ?

## 2021-10-20 DIAGNOSIS — Z992 Dependence on renal dialysis: Secondary | ICD-10-CM | POA: Diagnosis not present

## 2021-10-20 DIAGNOSIS — D509 Iron deficiency anemia, unspecified: Secondary | ICD-10-CM | POA: Diagnosis not present

## 2021-10-20 DIAGNOSIS — N250 Renal osteodystrophy: Secondary | ICD-10-CM | POA: Diagnosis not present

## 2021-10-20 DIAGNOSIS — D631 Anemia in chronic kidney disease: Secondary | ICD-10-CM | POA: Diagnosis not present

## 2021-10-20 DIAGNOSIS — N186 End stage renal disease: Secondary | ICD-10-CM | POA: Diagnosis not present

## 2021-10-21 ENCOUNTER — Encounter (HOSPITAL_COMMUNITY)
Admission: RE | Admit: 2021-10-21 | Discharge: 2021-10-21 | Disposition: A | Discharge status: Home / Self Care | Payer: Medicare Other | Source: Ambulatory Visit | Attending: Internal Medicine | Admitting: Internal Medicine

## 2021-10-21 DIAGNOSIS — I214 Non-ST elevation (NSTEMI) myocardial infarction: Secondary | ICD-10-CM | POA: Diagnosis not present

## 2021-10-21 DIAGNOSIS — D631 Anemia in chronic kidney disease: Secondary | ICD-10-CM | POA: Diagnosis not present

## 2021-10-21 DIAGNOSIS — Z955 Presence of coronary angioplasty implant and graft: Secondary | ICD-10-CM | POA: Diagnosis not present

## 2021-10-21 DIAGNOSIS — Z992 Dependence on renal dialysis: Secondary | ICD-10-CM | POA: Diagnosis not present

## 2021-10-21 DIAGNOSIS — D509 Iron deficiency anemia, unspecified: Secondary | ICD-10-CM | POA: Diagnosis not present

## 2021-10-21 DIAGNOSIS — N186 End stage renal disease: Secondary | ICD-10-CM | POA: Diagnosis not present

## 2021-10-21 DIAGNOSIS — Z9861 Coronary angioplasty status: Secondary | ICD-10-CM | POA: Diagnosis not present

## 2021-10-21 DIAGNOSIS — N250 Renal osteodystrophy: Secondary | ICD-10-CM | POA: Diagnosis not present

## 2021-10-21 NOTE — Progress Notes (Signed)
Daily Session Note ? ?Patient Details  ?Name: [name] [name] ?MRN: [id number] ?Date of Birth: [birth year] ?Referring Provider:   ?Flowsheet Row CARDIAC REHAB PHASE II ORIENTATION from 09/29/2021 in [facility]  ?Referring Provider Dr. Kurtis Bushman  ? ?  ? ? ?Encounter Date: 10/21/2021 ? ?Check In: ? Session Check In - 10/21/21 1100   ? ?  ? Check-In  ? Supervising physician immediately available to respond to emergencies [facility] MD immediately available   ? Physician(s) Dr Harrington Challenger   ? Location [facility]   ? Staff Present [name], BS, Exercise Physiologist;Yeilyn Gent Hassell Done, RN, Bjorn Loser, MS, ACSM-CEP, Exercise Physiologist   ? Virtual Visit No   ? Medication changes reported     No   ? Fall or balance concerns reported    No   ? Tobacco Cessation No Change   ? Warm-up and Cool-down Performed as group-led instruction   ? Resistance Training Performed Yes   ? VAD Patient? No   ? PAD/SET Patient? No   ?  ? Pain Assessment  ? Currently in Pain? No/denies   ? Pain Score 0-No pain   ? Multiple Pain Sites No   ? ?  ?  ? ?  ? ? ?Capillary Blood Glucose: ?No results found for this or any previous visit (from the past 24 hour(s)). ? ? ? ?Social History  ? ?Tobacco Use  ?Smoking Status Former  ? Packs/day: 1.00  ? Years: 25.00  ? Pack years: 25.00  ? Types: Cigarettes  ? Quit date: 03/27/2010  ? Years since quitting: 11.5  ?Smokeless Tobacco Never  ?Tobacco Comments  ? Quit x 7 years  ? ? ?Goals Met:  ?Independence with exercise equipment ?Exercise tolerated well ?No report of concerns or symptoms today ?Strength training completed today ? ?Goals Unmet:  ?Not Applicable ? ?Comments: Check out at 1200. ? ? ?Dr. Carlyle Dolly is Medical Director for [place] ?

## 2021-10-22 DIAGNOSIS — Z992 Dependence on renal dialysis: Secondary | ICD-10-CM | POA: Diagnosis not present

## 2021-10-22 DIAGNOSIS — N250 Renal osteodystrophy: Secondary | ICD-10-CM | POA: Diagnosis not present

## 2021-10-22 DIAGNOSIS — D509 Iron deficiency anemia, unspecified: Secondary | ICD-10-CM | POA: Diagnosis not present

## 2021-10-22 DIAGNOSIS — N186 End stage renal disease: Secondary | ICD-10-CM | POA: Diagnosis not present

## 2021-10-22 DIAGNOSIS — D631 Anemia in chronic kidney disease: Secondary | ICD-10-CM | POA: Diagnosis not present

## 2021-10-23 DIAGNOSIS — N186 End stage renal disease: Secondary | ICD-10-CM | POA: Diagnosis not present

## 2021-10-23 DIAGNOSIS — N250 Renal osteodystrophy: Secondary | ICD-10-CM | POA: Diagnosis not present

## 2021-10-23 DIAGNOSIS — D509 Iron deficiency anemia, unspecified: Secondary | ICD-10-CM | POA: Diagnosis not present

## 2021-10-23 DIAGNOSIS — Z992 Dependence on renal dialysis: Secondary | ICD-10-CM | POA: Diagnosis not present

## 2021-10-23 DIAGNOSIS — D631 Anemia in chronic kidney disease: Secondary | ICD-10-CM | POA: Diagnosis not present

## 2021-10-24 ENCOUNTER — Encounter: Payer: Self-pay | Admitting: Pulmonary Disease

## 2021-10-24 ENCOUNTER — Encounter (HOSPITAL_COMMUNITY): Payer: Medicare Other

## 2021-10-24 ENCOUNTER — Ambulatory Visit (INDEPENDENT_AMBULATORY_CARE_PROVIDER_SITE_OTHER): Payer: Medicare Other | Admitting: Pulmonary Disease

## 2021-10-24 VITALS — BP 134/82 | HR 68 | Temp 98.2°F | Ht 64.0 in | Wt 234.0 lb

## 2021-10-24 DIAGNOSIS — D509 Iron deficiency anemia, unspecified: Secondary | ICD-10-CM | POA: Diagnosis not present

## 2021-10-24 DIAGNOSIS — J4540 Moderate persistent asthma, uncomplicated: Secondary | ICD-10-CM

## 2021-10-24 DIAGNOSIS — Z9989 Dependence on other enabling machines and devices: Secondary | ICD-10-CM | POA: Diagnosis not present

## 2021-10-24 DIAGNOSIS — I2111 ST elevation (STEMI) myocardial infarction involving right coronary artery: Secondary | ICD-10-CM

## 2021-10-24 DIAGNOSIS — G4733 Obstructive sleep apnea (adult) (pediatric): Secondary | ICD-10-CM

## 2021-10-24 DIAGNOSIS — N186 End stage renal disease: Secondary | ICD-10-CM | POA: Diagnosis not present

## 2021-10-24 DIAGNOSIS — D631 Anemia in chronic kidney disease: Secondary | ICD-10-CM | POA: Diagnosis not present

## 2021-10-24 DIAGNOSIS — N250 Renal osteodystrophy: Secondary | ICD-10-CM | POA: Diagnosis not present

## 2021-10-24 DIAGNOSIS — Z992 Dependence on renal dialysis: Secondary | ICD-10-CM | POA: Diagnosis not present

## 2021-10-24 NOTE — Patient Instructions (Signed)
?  Continue symbicort ? ?X obtian DL on Luna machine from [place] ?

## 2021-10-24 NOTE — Progress Notes (Signed)
? ?  Subjective:  ? ? Patient ID: [name] [name], male    DOB: 1950/11/29, 71 y.o.   MRN: [id number] ? ?HPI ? 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  ?He carries a diagnosis of asthma, following exposure to [facility] dust on 9/11 maintained on Symbicort, albuterol.  He smoked about 25 pack years before he quit in 2011 started on oxygen with exertion 10 years ago, wears as needed ? ?He finally obtained CPAP machine from [place] in 2023 ?Unfortunately he had a myocardial infarction and was admitted in January 2023-reviewed cardiology consultation from February 2023, required DAPT , echo showed normal LVEF ? ?He is resting well denies any problems with mask or pressure.  He wakes up feeling rested denies somnolence or fatigue.  He is undergoing cardiac rehab ?He continues on PD ? ?Significant tests/ events reviewed ? ?CT chest/abdomen/pelvis 08/15/2021 clear lungs ?08/2020 HST >> AHI 21/h ?  ?PFTs 12/2019 ratio 74 FEV1 of 3 6 L (57% predicted), normal (pseudonormal) lung volumes, normal diffusion capacity. ? ?Review of Systems ?neg for any significant sore throat, dysphagia, itching, sneezing, nasal congestion or excess/ purulent secretions, fever, chills, sweats, unintended wt loss, pleuritic or exertional cp, hempoptysis, orthopnea pnd or change in chronic leg swelling. Also denies presyncope, palpitations, heartburn, abdominal pain, nausea, vomiting, diarrhea or change in bowel or urinary habits, dysuria,hematuria, rash, arthralgias, visual complaints, headache, numbness weakness or ataxia. ? ?   ?Objective:  ? Physical Exam ? ?Gen. Pleasant, obese, in no distress ?ENT - no lesions, no post nasal drip ?Neck: No JVD, no thyromegaly, no carotid bruits ?Lungs: no use of accessory muscles, no dullness to percussion, decreased without rales or rhonchi  ?Cardiovascular: Rhythm regular,  heart sounds  normal, no murmurs or gallops, no peripheral edema ?Musculoskeletal: No deformities, no cyanosis or clubbing , no tremors ? ? ? ? ?   ?Assessment & Plan:  ? ? ?

## 2021-10-24 NOTE — Assessment & Plan Note (Signed)
Continue Symbicort ?Albuterol for breakthrough ?

## 2021-10-24 NOTE — Assessment & Plan Note (Signed)
He has a Designer, multimedia. ?We reviewed CPAP download which shows residual AHI 5/hour on auto settings 5 to 20 cm with average pressure of 7.6 and maximum pressure of 9 cm.  AHI varies up to 10-15/hour on 5 nights out of 30 ?We will change auto CPAP to 5 to 12 cm ?CPAP supplies will be renewed ? ?Weight loss encouraged, compliance with goal of at least 4-6 hrs every night is the expectation. ?Advised against medications with sedative side effects ?Cautioned against driving when sleepy - understanding that sleepiness will vary on a day to day basis ? ?

## 2021-10-25 DIAGNOSIS — D631 Anemia in chronic kidney disease: Secondary | ICD-10-CM | POA: Diagnosis not present

## 2021-10-25 DIAGNOSIS — N250 Renal osteodystrophy: Secondary | ICD-10-CM | POA: Diagnosis not present

## 2021-10-25 DIAGNOSIS — Z992 Dependence on renal dialysis: Secondary | ICD-10-CM | POA: Diagnosis not present

## 2021-10-25 DIAGNOSIS — D509 Iron deficiency anemia, unspecified: Secondary | ICD-10-CM | POA: Diagnosis not present

## 2021-10-25 DIAGNOSIS — N186 End stage renal disease: Secondary | ICD-10-CM | POA: Diagnosis not present

## 2021-10-26 ENCOUNTER — Encounter (HOSPITAL_COMMUNITY)
Admission: RE | Admit: 2021-10-26 | Discharge: 2021-10-26 | Disposition: A | Discharge status: Home / Self Care | Payer: Medicare Other | Source: Ambulatory Visit | Attending: Internal Medicine | Admitting: Internal Medicine

## 2021-10-26 DIAGNOSIS — I214 Non-ST elevation (NSTEMI) myocardial infarction: Secondary | ICD-10-CM

## 2021-10-26 DIAGNOSIS — Z9861 Coronary angioplasty status: Secondary | ICD-10-CM

## 2021-10-26 DIAGNOSIS — Z955 Presence of coronary angioplasty implant and graft: Secondary | ICD-10-CM | POA: Diagnosis not present

## 2021-10-26 DIAGNOSIS — Z992 Dependence on renal dialysis: Secondary | ICD-10-CM | POA: Diagnosis not present

## 2021-10-26 DIAGNOSIS — D631 Anemia in chronic kidney disease: Secondary | ICD-10-CM | POA: Diagnosis not present

## 2021-10-26 DIAGNOSIS — D509 Iron deficiency anemia, unspecified: Secondary | ICD-10-CM | POA: Diagnosis not present

## 2021-10-26 DIAGNOSIS — N186 End stage renal disease: Secondary | ICD-10-CM | POA: Diagnosis not present

## 2021-10-26 DIAGNOSIS — N250 Renal osteodystrophy: Secondary | ICD-10-CM | POA: Diagnosis not present

## 2021-10-26 NOTE — Progress Notes (Signed)
Daily Session Note ? ?Patient Details  ?Name: [name] [name] ?MRN: [id number] ?Date of Birth: 22-Apr-1951 ?Referring Provider:   ?Flowsheet Row CARDIAC REHAB PHASE II ORIENTATION from 09/29/2021 in [facility]  ?Referring Provider Dr. Kurtis Bushman  ? ?  ? ? ?Encounter Date: 10/26/2021 ? ?Check In: ? Session Check In - 10/26/21 1100   ? ?  ? Check-In  ? Supervising physician immediately available to respond to emergencies [facility] MD immediately available   ? Physician(s) Dr Gasper Sells   ? Location [facility]   ? Staff Present Geanie Cooley, RN;Heather Otho Ket, [place], Exercise Physiologist;Jevonte Clanton Hassell Done, RN, Bjorn Loser, MS, ACSM-CEP, Exercise Physiologist   ? Virtual Visit No   ? Medication changes reported     No   ? Fall or balance concerns reported    No   ? Tobacco Cessation No Change   ? Warm-up and Cool-down Performed as group-led instruction   ? Resistance Training Performed Yes   ? VAD Patient? No   ? PAD/SET Patient? No   ?  ? Pain Assessment  ? Currently in Pain? No/denies   ? Pain Score 0-No pain   ? Multiple Pain Sites No   ? ?  ?  ? ?  ? ? ?Capillary Blood Glucose: ?No results found for this or any previous visit (from the past 24 hour(s)). ? ? ? ?Social History  ? ?Tobacco Use  ?Smoking Status Former  ? Packs/day: 1.00  ? Years: 25.00  ? Pack years: 25.00  ? Types: Cigarettes  ? Quit date: 03/27/2010  ? Years since quitting: 11.5  ?Smokeless Tobacco Never  ?Tobacco Comments  ? Quit x 7 years  ? ? ?Goals Met:  ?Independence with exercise equipment ?Exercise tolerated well ?No report of concerns or symptoms today ?Strength training completed today ? ?Goals Unmet:  ?Not Applicable ? ?Comments: Check out at 1200. ? ? ?Dr. Carlyle Dolly is Medical Director for [place] ?

## 2021-10-27 DIAGNOSIS — E1100 Type 2 diabetes mellitus with hyperosmolarity without nonketotic hyperglycemic-hyperosmolar coma (NKHHC): Secondary | ICD-10-CM | POA: Diagnosis not present

## 2021-10-27 DIAGNOSIS — E1165 Type 2 diabetes mellitus with hyperglycemia: Secondary | ICD-10-CM | POA: Diagnosis not present

## 2021-10-27 DIAGNOSIS — I2510 Atherosclerotic heart disease of native coronary artery without angina pectoris: Secondary | ICD-10-CM | POA: Diagnosis not present

## 2021-10-27 DIAGNOSIS — D509 Iron deficiency anemia, unspecified: Secondary | ICD-10-CM | POA: Diagnosis not present

## 2021-10-27 DIAGNOSIS — I10 Essential (primary) hypertension: Secondary | ICD-10-CM | POA: Diagnosis not present

## 2021-10-27 DIAGNOSIS — N250 Renal osteodystrophy: Secondary | ICD-10-CM | POA: Diagnosis not present

## 2021-10-27 DIAGNOSIS — D631 Anemia in chronic kidney disease: Secondary | ICD-10-CM | POA: Diagnosis not present

## 2021-10-27 DIAGNOSIS — Z992 Dependence on renal dialysis: Secondary | ICD-10-CM | POA: Diagnosis not present

## 2021-10-27 DIAGNOSIS — N186 End stage renal disease: Secondary | ICD-10-CM | POA: Diagnosis not present

## 2021-10-28 ENCOUNTER — Encounter (HOSPITAL_COMMUNITY)
Admission: RE | Admit: 2021-10-28 | Discharge: 2021-10-28 | Disposition: A | Discharge status: Home / Self Care | Payer: Medicare Other | Source: Ambulatory Visit | Attending: Internal Medicine | Admitting: Internal Medicine

## 2021-10-28 DIAGNOSIS — Z992 Dependence on renal dialysis: Secondary | ICD-10-CM | POA: Diagnosis not present

## 2021-10-28 DIAGNOSIS — D631 Anemia in chronic kidney disease: Secondary | ICD-10-CM | POA: Diagnosis not present

## 2021-10-28 DIAGNOSIS — D509 Iron deficiency anemia, unspecified: Secondary | ICD-10-CM | POA: Diagnosis not present

## 2021-10-28 DIAGNOSIS — Z9861 Coronary angioplasty status: Secondary | ICD-10-CM | POA: Diagnosis not present

## 2021-10-28 DIAGNOSIS — Z955 Presence of coronary angioplasty implant and graft: Secondary | ICD-10-CM | POA: Diagnosis not present

## 2021-10-28 DIAGNOSIS — N186 End stage renal disease: Secondary | ICD-10-CM | POA: Diagnosis not present

## 2021-10-28 DIAGNOSIS — I214 Non-ST elevation (NSTEMI) myocardial infarction: Secondary | ICD-10-CM

## 2021-10-28 DIAGNOSIS — N250 Renal osteodystrophy: Secondary | ICD-10-CM | POA: Diagnosis not present

## 2021-10-28 NOTE — Progress Notes (Signed)
Daily Session Note ? ?Patient Details  ?Name: [name] [name] ?MRN: [id number] ?Date of Birth: 1951/03/20 ?Referring Provider:   ?Flowsheet Row CARDIAC REHAB PHASE II ORIENTATION from 09/29/2021 in [facility]  ?Referring Provider Dr. Kurtis Bushman  ? ?  ? ? ?Encounter Date: 10/28/2021 ? ?Check In: ? Session Check In - 10/28/21 1100   ? ?  ? Check-In  ? Supervising physician immediately available to respond to emergencies [facility] MD immediately available   ? Physician(s) Nishan   ? Location [facility]   ? Staff Present [name], BS, Exercise Physiologist;Terressa Evola Wynetta Emery, RN, Bjorn Loser, MS, ACSM-CEP, Exercise Physiologist   ? Virtual Visit No   ? Medication changes reported     No   ? Fall or balance concerns reported    No   ? Tobacco Cessation No Change   ? Warm-up and Cool-down Performed as group-led instruction   ? Resistance Training Performed Yes   ? VAD Patient? No   ? PAD/SET Patient? No   ?  ? Pain Assessment  ? Currently in Pain? No/denies   ? Pain Score 0-No pain   ? Multiple Pain Sites No   ? ?  ?  ? ?  ? ? ?Capillary Blood Glucose: ?No results found for this or any previous visit (from the past 24 hour(s)). ? ? ? ?Social History  ? ?Tobacco Use  ?Smoking Status Former  ? Packs/day: 1.00  ? Years: 25.00  ? Pack years: 25.00  ? Types: Cigarettes  ? Quit date: 03/27/2010  ? Years since quitting: 11.5  ?Smokeless Tobacco Never  ?Tobacco Comments  ? Quit x 7 years  ? ? ?Goals Met:  ?Independence with exercise equipment ?Exercise tolerated well ?No report of concerns or symptoms today ?Strength training completed today ? ?Goals Unmet:  ?Not Applicable ? ?Comments: Check out 1200. ? ? ?Dr. Carlyle Dolly is Medical Director for [place] ?

## 2021-10-29 DIAGNOSIS — D631 Anemia in chronic kidney disease: Secondary | ICD-10-CM | POA: Diagnosis not present

## 2021-10-29 DIAGNOSIS — N186 End stage renal disease: Secondary | ICD-10-CM | POA: Diagnosis not present

## 2021-10-29 DIAGNOSIS — Z992 Dependence on renal dialysis: Secondary | ICD-10-CM | POA: Diagnosis not present

## 2021-10-29 DIAGNOSIS — N250 Renal osteodystrophy: Secondary | ICD-10-CM | POA: Diagnosis not present

## 2021-10-29 DIAGNOSIS — D509 Iron deficiency anemia, unspecified: Secondary | ICD-10-CM | POA: Diagnosis not present

## 2021-10-30 DIAGNOSIS — D509 Iron deficiency anemia, unspecified: Secondary | ICD-10-CM | POA: Diagnosis not present

## 2021-10-30 DIAGNOSIS — N186 End stage renal disease: Secondary | ICD-10-CM | POA: Diagnosis not present

## 2021-10-30 DIAGNOSIS — Z992 Dependence on renal dialysis: Secondary | ICD-10-CM | POA: Diagnosis not present

## 2021-10-30 DIAGNOSIS — N250 Renal osteodystrophy: Secondary | ICD-10-CM | POA: Diagnosis not present

## 2021-10-30 DIAGNOSIS — D631 Anemia in chronic kidney disease: Secondary | ICD-10-CM | POA: Diagnosis not present

## 2021-10-31 ENCOUNTER — Encounter (HOSPITAL_COMMUNITY)
Admission: RE | Admit: 2021-10-31 | Discharge: 2021-10-31 | Disposition: A | Discharge status: Home / Self Care | Payer: Medicare Other | Source: Ambulatory Visit | Attending: Internal Medicine | Admitting: Internal Medicine

## 2021-10-31 DIAGNOSIS — Z9861 Coronary angioplasty status: Secondary | ICD-10-CM

## 2021-10-31 DIAGNOSIS — N250 Renal osteodystrophy: Secondary | ICD-10-CM | POA: Diagnosis not present

## 2021-10-31 DIAGNOSIS — Z955 Presence of coronary angioplasty implant and graft: Secondary | ICD-10-CM

## 2021-10-31 DIAGNOSIS — D631 Anemia in chronic kidney disease: Secondary | ICD-10-CM | POA: Diagnosis not present

## 2021-10-31 DIAGNOSIS — I214 Non-ST elevation (NSTEMI) myocardial infarction: Secondary | ICD-10-CM

## 2021-10-31 DIAGNOSIS — Z992 Dependence on renal dialysis: Secondary | ICD-10-CM | POA: Diagnosis not present

## 2021-10-31 DIAGNOSIS — D509 Iron deficiency anemia, unspecified: Secondary | ICD-10-CM | POA: Diagnosis not present

## 2021-10-31 DIAGNOSIS — N186 End stage renal disease: Secondary | ICD-10-CM | POA: Diagnosis not present

## 2021-10-31 NOTE — Progress Notes (Signed)
Daily Session Note ? ?Patient Details  ?Name: [name] [name] ?MRN: [id number] ?Date of Birth: [birth year] ?Referring Provider:   ?Flowsheet Row CARDIAC REHAB PHASE II ORIENTATION from 09/29/2021 in [facility]  ?Referring Provider Dr. Kurtis Bushman  ? ?  ? ? ?Encounter Date: 10/31/2021 ? ?Check In: ? Session Check In - 10/31/21 1100   ? ?  ? Check-In  ? Supervising physician immediately available to respond to emergencies [facility] MD immediately available   ? Physician(s) Dr Johnsie Cancel   ? Location [facility]   ? Staff Present [name], BS, Exercise Physiologist;Rhona Fusilier Hassell Done, RN, Bjorn Loser, MS, ACSM-CEP, Exercise Physiologist   ? Virtual Visit No   ? Medication changes reported     No   ? Fall or balance concerns reported    No   ? Tobacco Cessation No Change   ? Warm-up and Cool-down Performed as group-led instruction   ? Resistance Training Performed Yes   ? VAD Patient? No   ? PAD/SET Patient? No   ?  ? Pain Assessment  ? Currently in Pain? No/denies   ? Pain Score 0-No pain   ? Multiple Pain Sites No   ? ?  ?  ? ?  ? ? ?Capillary Blood Glucose: ?No results found for this or any previous visit (from the past 24 hour(s)). ? ? ? ?Social History  ? ?Tobacco Use  ?Smoking Status Former  ? Packs/day: 1.00  ? Years: 25.00  ? Pack years: 25.00  ? Types: Cigarettes  ? Quit date: 03/27/2010  ? Years since quitting: 11.6  ?Smokeless Tobacco Never  ?Tobacco Comments  ? Quit x 7 years  ? ? ?Goals Met:  ?Independence with exercise equipment ?Changing diet to healthy choices, watching portion sizes ?No report of concerns or symptoms today ?Strength training completed today ? ?Goals Unmet:  ?Not Applicable ? ?Comments: checkout at 1200. ? ? ?Dr. Carlyle Dolly is Medical Director for [place] ?

## 2021-11-01 DIAGNOSIS — D631 Anemia in chronic kidney disease: Secondary | ICD-10-CM | POA: Diagnosis not present

## 2021-11-01 DIAGNOSIS — D509 Iron deficiency anemia, unspecified: Secondary | ICD-10-CM | POA: Diagnosis not present

## 2021-11-01 DIAGNOSIS — Z992 Dependence on renal dialysis: Secondary | ICD-10-CM | POA: Diagnosis not present

## 2021-11-01 DIAGNOSIS — N250 Renal osteodystrophy: Secondary | ICD-10-CM | POA: Diagnosis not present

## 2021-11-01 DIAGNOSIS — N186 End stage renal disease: Secondary | ICD-10-CM | POA: Diagnosis not present

## 2021-11-02 ENCOUNTER — Encounter (HOSPITAL_COMMUNITY): Payer: Medicare Other

## 2021-11-02 ENCOUNTER — Encounter: Payer: Self-pay | Admitting: Gastroenterology

## 2021-11-02 ENCOUNTER — Ambulatory Visit (INDEPENDENT_AMBULATORY_CARE_PROVIDER_SITE_OTHER): Payer: Medicare Other | Admitting: Gastroenterology

## 2021-11-02 VITALS — BP 118/78 | HR 75 | Temp 97.3°F | Ht 64.0 in | Wt 234.4 lb

## 2021-11-02 DIAGNOSIS — D509 Iron deficiency anemia, unspecified: Secondary | ICD-10-CM | POA: Diagnosis not present

## 2021-11-02 DIAGNOSIS — K640 First degree hemorrhoids: Secondary | ICD-10-CM | POA: Diagnosis not present

## 2021-11-02 DIAGNOSIS — I2111 ST elevation (STEMI) myocardial infarction involving right coronary artery: Secondary | ICD-10-CM

## 2021-11-02 DIAGNOSIS — N186 End stage renal disease: Secondary | ICD-10-CM | POA: Diagnosis not present

## 2021-11-02 DIAGNOSIS — K219 Gastro-esophageal reflux disease without esophagitis: Secondary | ICD-10-CM

## 2021-11-02 DIAGNOSIS — Z992 Dependence on renal dialysis: Secondary | ICD-10-CM | POA: Diagnosis not present

## 2021-11-02 DIAGNOSIS — N250 Renal osteodystrophy: Secondary | ICD-10-CM | POA: Diagnosis not present

## 2021-11-02 DIAGNOSIS — D631 Anemia in chronic kidney disease: Secondary | ICD-10-CM | POA: Diagnosis not present

## 2021-11-02 NOTE — Patient Instructions (Signed)
Continue to avoid straining, limit toilet time to 2-3 minutes.  ? ?You can use hemorrhoid wipes, Tuck's pads as needed, or the soap/water after bowel movement.  ? ?You can use the cream if needed for worsening symptoms. ? ?If any further bleeding, let me know! ? ?We can see you in 1 year. Let me know if anything changes or worsens! ? ?I enjoyed seeing you again today! As you know, I value our relationship and want to provide genuine, compassionate, and quality care. I welcome your feedback. If you receive a survey regarding your visit,  I greatly appreciate you taking time to fill this out. See you next time! ? ?Annitta Needs, PhD, ANP-BC ?[facility]  ? ?

## 2021-11-02 NOTE — Progress Notes (Signed)
? ? ?Gastroenterology Office Note   ? ? ?Primary Care Physician:  Carrolyn Meiers, MD  ?Primary Gastroenterologist: Dr. Abbey Chatters ? ? ?Chief Complaint  ? ?Chief Complaint  ?Patient presents with  ? Follow-up  ?  Only has itching when going to the bathroom, no blood on tissue or in toilet. Bm's are normal.  ? ? ? ?History of Present Illness  ? ?[name] [name] is a 71 y.o. male presenting today in follow-up with a history of chronic GERD and hemorrhoids. Last colonoscopy 2016 with hyperplastic polyps. In interim from last visit, had STEMI s/p DES to RCA Jan 2023. On DAPT currently.  ? ?No rectal bleeding. Only notes itching when having a BM. Cream or using soap/water for wiping resolves this. No prolapsing tissue. No straining. No constipation. No abdominal pain.  ? ? ? ? ? ? ?Past Medical History:  ?Diagnosis Date  ? Anemia   ? Arthritis   ? Asthma   ? Bell palsy   ? CAD (coronary artery disease)   ? a. 2014 MV: abnl w/ infap ischemia; b. 03/2013 Cath: aneurysmal bleb in the LAD w/ otw nonobs dzs-->Med Rx.  ? Chronic back pain   ? Chronic knee pain   ? a. 09/2015 s/p R TKA.  ? Chronic pain   ? Chronic shoulder pain   ? Chronic sinusitis   ? CKD (chronic kidney disease), stage IV ([place])   ? stage 4 per office visit note of Dr Lowanda Foster on 05/2015   ? COPD (chronic obstructive pulmonary disease) ([place])   ? Diabetes mellitus without complication ([place])   ? type II   ? Essential hypertension   ? GERD (gastroesophageal reflux disease)   ? Gout   ? Gout   ? Hepatomegaly   ? noted on noncontrast CT 2015  ? History of hiatal hernia   ? Hyperlipidemia   ? Lateral meniscus tear   ? Obesity   ? Truncal  ? Obstructive sleep apnea   ? does not use cpap   ? On home oxygen therapy   ? uses 2l when is going somewhere per patient   ? PUD (peptic ulcer disease)   ? remote, reports f/u EGD about 8 years ago unremarkable   ? Reactive airway disease   ? related to exposure to chemical during 9/11  ? Renal insufficiency   ? Sinusitis   ?  Vitamin D deficiency   ? ? ?Past Surgical History:  ?Procedure Laterality Date  ? ASAD LT SHOULDER  12/15/2008  ? left shoulder  ? AV FISTULA PLACEMENT Left 08/09/2016  ? Procedure: BRACHIOCEPHALIC ARTERIOVENOUS (AV) FISTULA CREATION LEFT ARM;  Surgeon: [name], MD;  Location: [facility];  Service: Vascular;  Laterality: Left;  ? CAPD INSERTION N/A 10/07/2018  ? Procedure: LAPAROSCOPIC PERITONEAL CATHETER PLACEMENT;  Surgeon: Kinsinger, Arta Bruce, MD;  Location: [facility];  Service: General;  Laterality: N/A;  ? CATARACT EXTRACTION W/PHACO Left 03/28/2016  ? Procedure: CATARACT EXTRACTION PHACO AND INTRAOCULAR LENS PLACEMENT LEFT EYE;  Surgeon: [name], MD;  Location: [facility];  Service: Ophthalmology;  Laterality: Left;  CDE: 4.77  ? CATARACT EXTRACTION W/PHACO Right 04/11/2016  ? Procedure: CATARACT EXTRACTION PHACO AND INTRAOCULAR LENS PLACEMENT RIGHT EYE; CDE:  4.74;  Surgeon: [name], MD;  Location: [facility];  Service: Ophthalmology;  Laterality: Right;  ? COLONOSCOPY  10/15/2008  ? Fields: Rectal polyp obliterated, not retrieved, hemorrhoids, single ascending colon diverticulum near the CV. Next colonoscopy April 2020  ?  COLONOSCOPY N/A 12/25/2014  ? SLF: 1. Colorectal polyps (2) removed 2. Small internal hemorrhoids 3. the left colon is severely redundant. hyperplastic polyps  ? CORONARY STENT INTERVENTION N/A 07/25/2021  ? Procedure: CORONARY STENT INTERVENTION;  Surgeon: Sherren Mocha, MD;  Location: [facility];  Service: Cardiovascular;  Laterality: N/A;  ? DOPPLER ECHOCARDIOGRAPHY    ? ESOPHAGOGASTRODUODENOSCOPY N/A 12/25/2014  ? SLF: 1. Anemia most likely due to CRI, gastritis, gastric polyps 2. Moderate non-erosive gastriits and mild duodenitis.  3.TWo large gstric polyps removed.   ? EYE SURGERY  12/22/2010  ? tear duct probing-[place]  ? FOREIGN BODY REMOVAL  03/29/2011  ? Procedure: REMOVAL FOREIGN BODY EXTREMITY;  Surgeon: Arther Abbott, MD;  Location: [facility];  Service:  Orthopedics;  Laterality: Right;  Removal Foreign Body Right Thumb  ? IR FLUORO GUIDE CV LINE RIGHT  08/06/2018  ? IR US GUIDE VASC ACCESS RIGHT  08/06/2018  ? KNEE ARTHROSCOPY  10/16/2007  ? left  ? KNEE ARTHROSCOPY WITH LATERAL MENISECTOMY Right 10/14/2015  ? Procedure: LEFT KNEE ARTHROSCOPY WITH PARTIAL LATERAL MENISECTOMY;  Surgeon: Carole Civil, MD;  Location: [facility];  Service: Orthopedics;  Laterality: Right;  ? LEFT HEART CATH AND CORONARY ANGIOGRAPHY N/A 07/25/2021  ? Procedure: LEFT HEART CATH AND CORONARY ANGIOGRAPHY;  Surgeon: Sherren Mocha, MD;  Location: [facility];  Service: Cardiovascular;  Laterality: N/A;  ? LEFT HEART CATHETERIZATION WITH CORONARY ANGIOGRAM N/A 03/28/2013  ? Procedure: LEFT HEART CATHETERIZATION WITH CORONARY ANGIOGRAM;  Surgeon: Leonie Man, MD;  Location: [facility];  Service: Cardiovascular;  Laterality: N/A;  ? NM MYOVIEW LTD    ? PENILE PROSTHESIS IMPLANT N/A 08/16/2015  ? Procedure: PENILE PROTHESIS INFLATABLE, three piece, Excisional biopsy of Penile ulcer, Penile molding;  Surgeon: Carolan Clines, MD;  [facility];  Service: Urology;  Laterality: N/A;  ? PENILE PROSTHESIS IMPLANT N/A 12/24/2017  ? Procedure: REMOVAL AND  REPLACEMENT  COLOPLAST PENILE PROSTHESIS;  Surgeon: Lucas Mallow, MD;  [facility];  Service: Urology;  Laterality: N/A;  ? QUADRICEPS TENDON REPAIR  07/21/2011  ? Procedure: REPAIR QUADRICEP TENDON;  Surgeon: Arther Abbott, MD;  Location: [facility];  Service: Orthopedics;  Laterality: Right;  ? TOENAIL EXCISION    ? removed [id number]  ? UMBILICAL HERNIA REPAIR  07/17/2005  ? [place]  ? ? ?Current Outpatient Medications  ?Medication Sig Dispense Refill  ? albuterol (VENTOLIN HFA) 108 (90 Base) MCG/ACT inhaler Inhale 2 puffs into the lungs every 6 (six) hours as needed for wheezing or shortness of breath. 18 g 11  ? aspirin EC 81 MG tablet Take 81 mg by mouth daily. Swallow whole.    ? budesonide-formoterol  (SYMBICORT) 160-4.5 MCG/ACT inhaler Inhale 2 puffs into the lungs 2 (two) times daily. 1 each 6  ? calcitRIOL (ROCALTROL) 0.25 MCG capsule Take 0.25 mcg by mouth daily.    ? glucose blood test strip 1 each by Other route 2 (two) times daily. Use as instructed to check blood glucose twice daily 200 each 2  ? hydrocortisone (ANUSOL-HC) 2.5 % rectal cream Place 1 application rectally 2 (two) times daily. 30 g 1  ? Insulin Pen Needle (DROPLET PEN NEEDLES) 31G X 5 MM MISC Use to inject insulin daily as directed 150 each 2  ? LANTUS SOLOSTAR 100 UNIT/ML Solostar Pen INJECT  30 UNITS SUBCUTANEOUSLY AT BEDTIME (Patient taking differently: 30 Units at bedtime.) 30 mL 1  ? losartan (COZAAR) 25 MG tablet Take 1  tablet (25 mg total) by mouth daily. 30 tablet 6  ? metoprolol succinate (TOPROL-XL) 25 MG 24 hr tablet Take 25 mg by mouth daily.    ? pantoprazole (PROTONIX) 40 MG tablet Take 1 tablet (40 mg total) by mouth 2 (two) times daily before a meal. 60 tablet 5  ? simvastatin (ZOCOR) 20 MG tablet Take 20 mg by mouth every morning.    ? ticagrelor (BRILINTA) 90 MG TABS tablet Take 1 tablet (90 mg total) by mouth 2 (two) times daily. 60 tablet 3  ? torsemide (DEMADEX) 100 MG tablet Take 100 mg by mouth daily.    ? TRADJENTA 5 MG TABS tablet TAKE 1 TABLET EVERY DAY 90 tablet 1  ? Vitamin D, Ergocalciferol, (DRISDOL) 1.25 MG (50000 UNIT) CAPS capsule Take 50,000 Units by mouth every 7 (seven) days.    ? ?No current facility-administered medications for this visit.  ? ? ?Allergies as of 11/02/2021 - Review Complete 11/02/2021  ?Allergen Reaction Noted  ? Opana [oxymorphone hcl] Itching 01/15/2014  ? Oxymorphone Itching 01/15/2014  ? Tramadol Itching 07/10/2011  ? ? ?Family History  ?Problem Relation Age of Onset  ? Hypertension Mother   ?     MI  ? Cancer Mother   ?     breast   ? Diabetes Mother   ? Diabetes Father   ? Hypertension Father   ? Hypertension Sister   ? Diabetes Sister   ? Arthritis Other   ? Asthma Other   ? Lung  disease Other   ? Anesthesia problems Neg Hx   ? Hypotension Neg Hx   ? Malignant hyperthermia Neg Hx   ? Pseudochol deficiency Neg Hx   ? Colon cancer Neg Hx   ? ? ?Social History  ? ?Socioeconomic History  ? Marital s

## 2021-11-03 DIAGNOSIS — N186 End stage renal disease: Secondary | ICD-10-CM | POA: Diagnosis not present

## 2021-11-03 DIAGNOSIS — D509 Iron deficiency anemia, unspecified: Secondary | ICD-10-CM | POA: Diagnosis not present

## 2021-11-03 DIAGNOSIS — Z992 Dependence on renal dialysis: Secondary | ICD-10-CM | POA: Diagnosis not present

## 2021-11-03 DIAGNOSIS — N250 Renal osteodystrophy: Secondary | ICD-10-CM | POA: Diagnosis not present

## 2021-11-03 DIAGNOSIS — D631 Anemia in chronic kidney disease: Secondary | ICD-10-CM | POA: Diagnosis not present

## 2021-11-04 ENCOUNTER — Encounter (HOSPITAL_COMMUNITY)
Admission: RE | Admit: 2021-11-04 | Discharge: 2021-11-04 | Disposition: A | Discharge status: Home / Self Care | Payer: Medicare Other | Source: Ambulatory Visit | Attending: Internal Medicine | Admitting: Internal Medicine

## 2021-11-04 DIAGNOSIS — Z955 Presence of coronary angioplasty implant and graft: Secondary | ICD-10-CM | POA: Diagnosis not present

## 2021-11-04 DIAGNOSIS — D631 Anemia in chronic kidney disease: Secondary | ICD-10-CM | POA: Diagnosis not present

## 2021-11-04 DIAGNOSIS — Z9861 Coronary angioplasty status: Secondary | ICD-10-CM | POA: Diagnosis not present

## 2021-11-04 DIAGNOSIS — N186 End stage renal disease: Secondary | ICD-10-CM | POA: Diagnosis not present

## 2021-11-04 DIAGNOSIS — Z992 Dependence on renal dialysis: Secondary | ICD-10-CM | POA: Diagnosis not present

## 2021-11-04 DIAGNOSIS — N250 Renal osteodystrophy: Secondary | ICD-10-CM | POA: Diagnosis not present

## 2021-11-04 DIAGNOSIS — I214 Non-ST elevation (NSTEMI) myocardial infarction: Secondary | ICD-10-CM

## 2021-11-04 DIAGNOSIS — D509 Iron deficiency anemia, unspecified: Secondary | ICD-10-CM | POA: Diagnosis not present

## 2021-11-04 NOTE — Progress Notes (Signed)
Daily Session Note ? ?Patient Details  ?Name: [name] [name] ?MRN: [id number] ?Date of Birth: 1951-05-05 ?Referring Provider:   ?Flowsheet Row CARDIAC REHAB PHASE II ORIENTATION from 09/29/2021 in [facility]  ?Referring Provider Dr. Kurtis Bushman  ? ?  ? ? ?Encounter Date: 11/04/2021 ? ?Check In: ? Session Check In - 11/04/21 1055   ? ?  ? Check-In  ? Supervising physician immediately available to respond to emergencies [facility] MD immediately available   ? Physician(s) Dr Harrington Challenger   ? Location [facility]   ? Staff Present Madelyn Flavors, RN, BSN;Christy Edwards, RN, BSN;Heather Otho Ket, BS, Exercise Physiologist   ? Virtual Visit No   ? Medication changes reported     No   ? Fall or balance concerns reported    No   ? Tobacco Cessation No Change   ? Warm-up and Cool-down Performed as group-led instruction   ? Resistance Training Performed Yes   ? VAD Patient? No   ? PAD/SET Patient? No   ?  ? Pain Assessment  ? Currently in Pain? No/denies   ? Pain Score 0-No pain   ? Multiple Pain Sites No   ? ?  ?  ? ?  ? ? ?Capillary Blood Glucose: ?No results found for this or any previous visit (from the past 24 hour(s)). ? ? ? ?Social History  ? ?Tobacco Use  ?Smoking Status Former  ? Packs/day: 1.00  ? Years: 25.00  ? Pack years: 25.00  ? Types: Cigarettes  ? Quit date: 03/27/2010  ? Years since quitting: 11.6  ?Smokeless Tobacco Never  ?Tobacco Comments  ? Quit x 7 years  ? ? ?Goals Met:  ?Independence with exercise equipment ?Exercise tolerated well ?No report of concerns or symptoms today ?Strength training completed today ? ?Goals Unmet:  ?Not Applicable ? ?Comments: Check out at 1200. ? ? ?Dr. Carlyle Dolly is Medical Director for [place] ?

## 2021-11-05 DIAGNOSIS — N250 Renal osteodystrophy: Secondary | ICD-10-CM | POA: Diagnosis not present

## 2021-11-05 DIAGNOSIS — N186 End stage renal disease: Secondary | ICD-10-CM | POA: Diagnosis not present

## 2021-11-05 DIAGNOSIS — D509 Iron deficiency anemia, unspecified: Secondary | ICD-10-CM | POA: Diagnosis not present

## 2021-11-05 DIAGNOSIS — Z992 Dependence on renal dialysis: Secondary | ICD-10-CM | POA: Diagnosis not present

## 2021-11-05 DIAGNOSIS — D631 Anemia in chronic kidney disease: Secondary | ICD-10-CM | POA: Diagnosis not present

## 2021-11-06 DIAGNOSIS — D631 Anemia in chronic kidney disease: Secondary | ICD-10-CM | POA: Diagnosis not present

## 2021-11-06 DIAGNOSIS — D509 Iron deficiency anemia, unspecified: Secondary | ICD-10-CM | POA: Diagnosis not present

## 2021-11-06 DIAGNOSIS — Z992 Dependence on renal dialysis: Secondary | ICD-10-CM | POA: Diagnosis not present

## 2021-11-06 DIAGNOSIS — N186 End stage renal disease: Secondary | ICD-10-CM | POA: Diagnosis not present

## 2021-11-06 DIAGNOSIS — N250 Renal osteodystrophy: Secondary | ICD-10-CM | POA: Diagnosis not present

## 2021-11-07 ENCOUNTER — Encounter (HOSPITAL_COMMUNITY)
Admission: RE | Admit: 2021-11-07 | Discharge: 2021-11-07 | Disposition: A | Discharge status: Home / Self Care | Payer: Medicare Other | Source: Ambulatory Visit | Attending: Internal Medicine | Admitting: Internal Medicine

## 2021-11-07 VITALS — Wt 237.0 lb

## 2021-11-07 DIAGNOSIS — I214 Non-ST elevation (NSTEMI) myocardial infarction: Secondary | ICD-10-CM | POA: Diagnosis not present

## 2021-11-07 DIAGNOSIS — Z955 Presence of coronary angioplasty implant and graft: Secondary | ICD-10-CM | POA: Diagnosis not present

## 2021-11-07 DIAGNOSIS — N250 Renal osteodystrophy: Secondary | ICD-10-CM | POA: Diagnosis not present

## 2021-11-07 DIAGNOSIS — N186 End stage renal disease: Secondary | ICD-10-CM | POA: Diagnosis not present

## 2021-11-07 DIAGNOSIS — Z9861 Coronary angioplasty status: Secondary | ICD-10-CM

## 2021-11-07 DIAGNOSIS — Z992 Dependence on renal dialysis: Secondary | ICD-10-CM | POA: Diagnosis not present

## 2021-11-07 DIAGNOSIS — D631 Anemia in chronic kidney disease: Secondary | ICD-10-CM | POA: Diagnosis not present

## 2021-11-07 DIAGNOSIS — D509 Iron deficiency anemia, unspecified: Secondary | ICD-10-CM | POA: Diagnosis not present

## 2021-11-07 NOTE — Progress Notes (Signed)
Daily Session Note ? ?Patient Details  ?Name: [name] [name] ?MRN: [id number] ?Date of Birth: Jan 18, 1951 ?Referring Provider:   ?Flowsheet Row CARDIAC REHAB PHASE II ORIENTATION from 09/29/2021 in [facility]  ?Referring Provider Dr. Kurtis Bushman  ? ?  ? ? ?Encounter Date: 11/07/2021 ? ?Check In: ? Session Check In - 11/07/21 1100   ? ?  ? Check-In  ? Supervising physician immediately available to respond to emergencies [facility] MD immediately available   ? Physician(s) Chandrasekhar   ? Location [facility]   ? Staff Present [name], BS, Exercise Physiologist;Randall Colden Wynetta Emery, RN, BSN   ? Virtual Visit No   ? Medication changes reported     No   ? Fall or balance concerns reported    No   ? Tobacco Cessation No Change   ? Warm-up and Cool-down Performed as group-led instruction   ? Resistance Training Performed Yes   ? VAD Patient? No   ? PAD/SET Patient? No   ?  ? Pain Assessment  ? Currently in Pain? No/denies   ? Pain Score 0-No pain   ? Multiple Pain Sites No   ? ?  ?  ? ?  ? ? ?Capillary Blood Glucose: ?No results found for this or any previous visit (from the past 24 hour(s)). ? ? ? ?Social History  ? ?Tobacco Use  ?Smoking Status Former  ? Packs/day: 1.00  ? Years: 25.00  ? Pack years: 25.00  ? Types: Cigarettes  ? Quit date: 03/27/2010  ? Years since quitting: 11.6  ?Smokeless Tobacco Never  ?Tobacco Comments  ? Quit x 7 years  ? ? ?Goals Met:  ?Independence with exercise equipment ?Exercise tolerated well ?No report of concerns or symptoms today ?Strength training completed today ? ?Goals Unmet:  ?Not Applicable ? ?Comments: Check out 1200. ? ? ?Dr. Carlyle Dolly is Medical Director for [place] ?

## 2021-11-08 DIAGNOSIS — N250 Renal osteodystrophy: Secondary | ICD-10-CM | POA: Diagnosis not present

## 2021-11-08 DIAGNOSIS — Z992 Dependence on renal dialysis: Secondary | ICD-10-CM | POA: Diagnosis not present

## 2021-11-08 DIAGNOSIS — D631 Anemia in chronic kidney disease: Secondary | ICD-10-CM | POA: Diagnosis not present

## 2021-11-08 DIAGNOSIS — N186 End stage renal disease: Secondary | ICD-10-CM | POA: Diagnosis not present

## 2021-11-08 DIAGNOSIS — D509 Iron deficiency anemia, unspecified: Secondary | ICD-10-CM | POA: Diagnosis not present

## 2021-11-09 ENCOUNTER — Encounter (HOSPITAL_COMMUNITY)
Admission: RE | Admit: 2021-11-09 | Discharge: 2021-11-09 | Disposition: A | Discharge status: Home / Self Care | Payer: Medicare Other | Source: Ambulatory Visit | Attending: Internal Medicine | Admitting: Internal Medicine

## 2021-11-09 ENCOUNTER — Encounter (HOSPITAL_COMMUNITY): Payer: Medicare Other

## 2021-11-09 DIAGNOSIS — Z992 Dependence on renal dialysis: Secondary | ICD-10-CM | POA: Diagnosis not present

## 2021-11-09 DIAGNOSIS — Z955 Presence of coronary angioplasty implant and graft: Secondary | ICD-10-CM

## 2021-11-09 DIAGNOSIS — I214 Non-ST elevation (NSTEMI) myocardial infarction: Secondary | ICD-10-CM | POA: Diagnosis not present

## 2021-11-09 DIAGNOSIS — Z9861 Coronary angioplasty status: Secondary | ICD-10-CM | POA: Diagnosis not present

## 2021-11-09 DIAGNOSIS — D509 Iron deficiency anemia, unspecified: Secondary | ICD-10-CM | POA: Diagnosis not present

## 2021-11-09 DIAGNOSIS — N250 Renal osteodystrophy: Secondary | ICD-10-CM | POA: Diagnosis not present

## 2021-11-09 DIAGNOSIS — N186 End stage renal disease: Secondary | ICD-10-CM | POA: Diagnosis not present

## 2021-11-09 DIAGNOSIS — D631 Anemia in chronic kidney disease: Secondary | ICD-10-CM | POA: Diagnosis not present

## 2021-11-09 NOTE — Progress Notes (Signed)
Daily Session Note ? ?Patient Details  ?Name: [name] [name] ?MRN: [id number] ?Date of Birth: 1951/06/11 ?Referring Provider:   ?Flowsheet Row CARDIAC REHAB PHASE II ORIENTATION from 09/29/2021 in [facility]  ?Referring Provider Dr. Kurtis Bushman  ? ?  ? ? ?Encounter Date: 11/09/2021 ? ?Check In: ? Session Check In - 11/09/21 1100   ? ?  ? Check-In  ? Supervising physician immediately available to respond to emergencies [facility] MD immediately available   ? Physician(s) Dr. Domenic Polite   ? Location [facility]   ? Staff Present Hoy Register, MS, ACSM-CEP, Exercise Physiologist;Heather Zigmund Daniel, Exercise Physiologist   ? Virtual Visit No   ? Medication changes reported     No   ? Fall or balance concerns reported    No   ? Tobacco Cessation No Change   ? Warm-up and Cool-down Performed as group-led instruction   ? Resistance Training Performed Yes   ? VAD Patient? No   ? PAD/SET Patient? No   ?  ? Pain Assessment  ? Currently in Pain? No/denies   ? Pain Score 0-No pain   ? Multiple Pain Sites No   ? ?  ?  ? ?  ? ? ?Capillary Blood Glucose: ?No results found for this or any previous visit (from the past 24 hour(s)). ? ? ? ?Social History  ? ?Tobacco Use  ?Smoking Status Former  ? Packs/day: 1.00  ? Years: 25.00  ? Pack years: 25.00  ? Types: Cigarettes  ? Quit date: 03/27/2010  ? Years since quitting: 11.6  ?Smokeless Tobacco Never  ?Tobacco Comments  ? Quit x 7 years  ? ? ?Goals Met:  ?Independence with exercise equipment ?Exercise tolerated well ?No report of concerns or symptoms today ?Strength training completed today ? ?Goals Unmet:  ?Not Applicable ? ?Comments: checkout time Is 1200 ? ? ?Dr. Carlyle Dolly is Medical Director for [place] ?

## 2021-11-09 NOTE — Progress Notes (Signed)
Cardiac Individual Treatment Plan ? ?Patient Details  ?Name: [name] [name] ?MRN: [id number] ?Date of Birth: February 24, 1951 ?Referring Provider:   ?Flowsheet Row CARDIAC REHAB PHASE II ORIENTATION from 09/29/2021 in [facility]  ?Referring Provider Dr. Kurtis Bushman  ? ?  ? ? ?Initial Encounter Date:  ?Flowsheet Row CARDIAC REHAB PHASE II ORIENTATION from 09/29/2021 in [facility]  ?Date 09/29/21  ? ?  ? ? ?Visit Diagnosis: S/P coronary artery stent placement ? ?NSTEMI (non-ST elevated myocardial infarction) ([place]) ? ?S/P PTCA (percutaneous transluminal coronary angioplasty) ? ?Patient's Home Medications on Admission: ? ?Current Outpatient Medications:  ?  albuterol (VENTOLIN HFA) 108 (90 Base) MCG/ACT inhaler, Inhale 2 puffs into the lungs every 6 (six) hours as needed for wheezing or shortness of breath., Disp: 18 g, Rfl: 11 ?  aspirin EC 81 MG tablet, Take 81 mg by mouth daily. Swallow whole., Disp: , Rfl:  ?  budesonide-formoterol (SYMBICORT) 160-4.5 MCG/ACT inhaler, Inhale 2 puffs into the lungs 2 (two) times daily., Disp: 1 each, Rfl: 6 ?  calcitRIOL (ROCALTROL) 0.25 MCG capsule, Take 0.25 mcg by mouth daily., Disp: , Rfl:  ?  glucose blood test strip, 1 each by Other route 2 (two) times daily. Use as instructed to check blood glucose twice daily, Disp: 200 each, Rfl: 2 ?  hydrocortisone (ANUSOL-HC) 2.5 % rectal cream, Place 1 application rectally 2 (two) times daily., Disp: 30 g, Rfl: 1 ?  Insulin Pen Needle (DROPLET PEN NEEDLES) 31G X 5 MM MISC, Use to inject insulin daily as directed, Disp: 150 each, Rfl: 2 ?  LANTUS SOLOSTAR 100 UNIT/ML Solostar Pen, INJECT  30 UNITS SUBCUTANEOUSLY AT BEDTIME (Patient taking differently: 30 Units at bedtime.), Disp: 30 mL, Rfl: 1 ?  losartan (COZAAR) 25 MG tablet, Take 1 tablet (25 mg total) by mouth daily., Disp: 30 tablet, Rfl: 6 ?  metoprolol succinate (TOPROL-XL) 25 MG 24 hr tablet, Take 25 mg by mouth daily., Disp: , Rfl:  ?  pantoprazole  (PROTONIX) 40 MG tablet, Take 1 tablet (40 mg total) by mouth 2 (two) times daily before a meal., Disp: 60 tablet, Rfl: 5 ?  simvastatin (ZOCOR) 20 MG tablet, Take 20 mg by mouth every morning., Disp: , Rfl:  ?  ticagrelor (BRILINTA) 90 MG TABS tablet, Take 1 tablet (90 mg total) by mouth 2 (two) times daily., Disp: 60 tablet, Rfl: 3 ?  torsemide (DEMADEX) 100 MG tablet, Take 100 mg by mouth daily., Disp: , Rfl:  ?  TRADJENTA 5 MG TABS tablet, TAKE 1 TABLET EVERY DAY, Disp: 90 tablet, Rfl: 1 ?  Vitamin D, Ergocalciferol, (DRISDOL) 1.25 MG (50000 UNIT) CAPS capsule, Take 50,000 Units by mouth every 7 (seven) days., Disp: , Rfl:  ? ?Past Medical History: ?Past Medical History:  ?Diagnosis Date  ? Anemia   ? Arthritis   ? Asthma   ? Bell palsy   ? CAD (coronary artery disease)   ? a. 2014 MV: abnl w/ infap ischemia; b. 03/2013 Cath: aneurysmal bleb in the LAD w/ otw nonobs dzs-->Med Rx.  ? Chronic back pain   ? Chronic knee pain   ? a. 09/2015 s/p R TKA.  ? Chronic pain   ? Chronic shoulder pain   ? Chronic sinusitis   ? CKD (chronic kidney disease), stage IV ([place])   ? stage 4 per office visit note of Dr Lowanda Foster on 05/2015   ? COPD (chronic obstructive pulmonary disease) ([place])   ? Diabetes mellitus without complication (  [place])   ? type II   ? Essential hypertension   ? GERD (gastroesophageal reflux disease)   ? Gout   ? Gout   ? Hepatomegaly   ? noted on noncontrast CT 2015  ? History of hiatal hernia   ? Hyperlipidemia   ? Lateral meniscus tear   ? Obesity   ? Truncal  ? Obstructive sleep apnea   ? does not use cpap   ? On home oxygen therapy   ? uses 2l when is going somewhere per patient   ? PUD (peptic ulcer disease)   ? remote, reports f/u EGD about 8 years ago unremarkable   ? Reactive airway disease   ? related to exposure to chemical during 9/11  ? Renal insufficiency   ? Sinusitis   ? Vitamin D deficiency   ? ? ?Tobacco Use: ?Social History  ? ?Tobacco Use  ?Smoking Status Former  ? Packs/day: 1.00  ? Years:  25.00  ? Pack years: 25.00  ? Types: Cigarettes  ? Quit date: 03/27/2010  ? Years since quitting: 11.6  ?Smokeless Tobacco Never  ?Tobacco Comments  ? Quit x 7 years  ? ? ?Labs: ?Review Flowsheet   ? ?  ?  Latest Ref Rng & Units 06/16/2020 12/15/2020 06/10/2021 06/16/2021  ?Labs for [facility] Cardiac and Pulmonary Rehab  ?Cholestrol 100 - 199 mg/dL   104     ?LDL (calc) 0 - 99 mg/dL   53     ?HDL-C >39 mg/dL   30     ?Trlycerides 0 - 149 mg/dL   110     ?Hemoglobin A1c 4.8 - 5.6 % 6.1   6.2    6.2    ? ?  07/22/2021  ?Labs for [facility] Cardiac and Pulmonary Rehab  ?Cholestrol   ?LDL (calc)   ?HDL-C   ?Trlycerides   ?Hemoglobin A1c 6.6    ?  ? ? Multiple values from one day are sorted in reverse-chronological order  ?  ?  ? ? ?Capillary Blood Glucose: ?Lab Results  ?Component Value Date  ? GLUCAP 118 (H) 07/27/2021  ? GLUCAP 161 (H) 07/26/2021  ? GLUCAP 107 (H) 07/26/2021  ? GLUCAP 126 (H) 07/26/2021  ? GLUCAP 104 (H) 07/26/2021  ? ? ? ?Exercise Target Goals: ?Exercise Program Goal: ?Individual exercise prescription set using results from initial 6 min walk test and THRR while considering  patient?s activity barriers and safety.  ? ?Exercise Prescription Goal: ?Starting with aerobic activity 30 plus minutes a day, 3 days per week for initial exercise prescription. Provide home exercise prescription and guidelines that participant acknowledges understanding prior to discharge. ? ?Activity Barriers & Risk Stratification: ? Activity Barriers & Cardiac Risk Stratification - 09/29/21 1242   ? ?  ? Activity Barriers & Cardiac Risk Stratification  ? Activity Barriers Arthritis;Neck/Spine Problems;Back Problems;Shortness of Breath   ? Cardiac Risk Stratification High   ? ?  ?  ? ?  ? ? ?6 Minute Walk: ? 6 Minute Walk   ? ? [place] Name 09/29/21 1339  ?  ?  ?  ? 6 Minute Walk  ? Phase Initial    ? Distance 1150 feet    ? Walk Time 6 minutes    ? # of Rest Breaks 0    ? MPH 2.17    ? METS 1.97    ? RPE 12    ? VO2 Peak 6.92    ? Symptoms No    ?  Resting  HR 65 bpm    ? Resting BP 118/72    ? Resting Oxygen Saturation  98 %    ? Exercise Oxygen Saturation  during 6 min walk 94 %    ? Max Ex. HR 108 bpm    ? Max Ex. BP 142/60    ? 2 Minute Post BP 120/60    ? ?  ?  ? ?  ? ? ?Oxygen Initial Assessment: ? ? ?Oxygen Re-Evaluation: ? ? ?Oxygen Discharge (Final Oxygen Re-Evaluation): ? ? ?Initial Exercise Prescription: ? Initial Exercise Prescription - 09/29/21 1300   ? ?  ? Date of Initial Exercise RX and Referring Provider  ? Date 09/29/21   ? Referring Provider Dr. Kurtis Bushman   ? Expected Discharge Date 12/23/21   ?  ? Treadmill  ? MPH 1.3   ? Grade 0   ? Minutes 17   ?  ? NuStep  ? Level 1   ? SPM 60   ? Minutes 22   ?  ? Prescription Details  ? Frequency (times per week) 3   ? Duration Progress to 30 minutes of continuous aerobic without signs/symptoms of physical distress   ?  ? Intensity  ? THRR 40-80% of Max Heartrate 60-119   ? Ratings of Perceived Exertion 11-13   ?  ? Progression  ? Progression Continue progressive overload as per policy without signs/symptoms or physical distress.   ?  ? Resistance Training  ? Training Prescription Yes   ? Weight 3   ? Reps 10-15   ? ?  ?  ? ?  ? ? ?Perform Capillary Blood Glucose checks as needed. ? ?Exercise Prescription Changes: ? ? Exercise Prescription Changes   ? ? [place] Name 10/07/21 1100 10/10/21 1400 10/21/21 1509 11/07/21 1200  ?  ?  ? Response to Exercise  ? Blood Pressure (Admit) -- 126/70 132/76 126/60   ? Blood Pressure (Exercise) -- 148/74 116/60 120/60   ? Blood Pressure (Exit) -- 108/80 94/50 140/70   ? Heart Rate (Admit) -- 64 bpm 72 bpm 65 bpm   ? Heart Rate (Exercise) -- 121 bpm 108 bpm 106 bpm   ? Heart Rate (Exit) -- 73 bpm 81 bpm 74 bpm   ? Rating of Perceived Exertion (Exercise) -- '10 11 11   '$ ? Duration -- Continue with 30 min of aerobic exercise without signs/symptoms of physical distress. Continue with 30 min of aerobic exercise without signs/symptoms of physical distress. Continue with 30 min of  aerobic exercise without signs/symptoms of physical distress.   ? Intensity -- THRR unchanged THRR unchanged THRR unchanged   ?  ? Progression  ? Progression -- Continue to progress workloads to maintain intensi

## 2021-11-10 DIAGNOSIS — D631 Anemia in chronic kidney disease: Secondary | ICD-10-CM | POA: Diagnosis not present

## 2021-11-10 DIAGNOSIS — N186 End stage renal disease: Secondary | ICD-10-CM | POA: Diagnosis not present

## 2021-11-10 DIAGNOSIS — Z992 Dependence on renal dialysis: Secondary | ICD-10-CM | POA: Diagnosis not present

## 2021-11-10 DIAGNOSIS — D509 Iron deficiency anemia, unspecified: Secondary | ICD-10-CM | POA: Diagnosis not present

## 2021-11-10 DIAGNOSIS — N250 Renal osteodystrophy: Secondary | ICD-10-CM | POA: Diagnosis not present

## 2021-11-11 ENCOUNTER — Encounter (HOSPITAL_COMMUNITY)
Admission: RE | Admit: 2021-11-11 | Discharge: 2021-11-11 | Disposition: A | Discharge status: Home / Self Care | Payer: Medicare Other | Source: Ambulatory Visit | Attending: Internal Medicine | Admitting: Internal Medicine

## 2021-11-11 DIAGNOSIS — D631 Anemia in chronic kidney disease: Secondary | ICD-10-CM | POA: Diagnosis not present

## 2021-11-11 DIAGNOSIS — Z955 Presence of coronary angioplasty implant and graft: Secondary | ICD-10-CM

## 2021-11-11 DIAGNOSIS — I214 Non-ST elevation (NSTEMI) myocardial infarction: Secondary | ICD-10-CM

## 2021-11-11 DIAGNOSIS — D509 Iron deficiency anemia, unspecified: Secondary | ICD-10-CM | POA: Diagnosis not present

## 2021-11-11 DIAGNOSIS — N186 End stage renal disease: Secondary | ICD-10-CM | POA: Diagnosis not present

## 2021-11-11 DIAGNOSIS — N250 Renal osteodystrophy: Secondary | ICD-10-CM | POA: Diagnosis not present

## 2021-11-11 DIAGNOSIS — Z9861 Coronary angioplasty status: Secondary | ICD-10-CM | POA: Diagnosis not present

## 2021-11-11 DIAGNOSIS — Z992 Dependence on renal dialysis: Secondary | ICD-10-CM | POA: Diagnosis not present

## 2021-11-11 NOTE — Progress Notes (Signed)
Daily Session Note ? ?Patient Details  ?Name: [name] [name] ?MRN: [id number] ?Date of Birth: [birth year] ?Referring Provider:   ?Flowsheet Row CARDIAC REHAB PHASE II ORIENTATION from 09/29/2021 in [facility]  ?Referring Provider Dr. Kurtis Bushman  ? ?  ? ? ?Encounter Date: 11/11/2021 ? ?Check In: ? Session Check In - 11/11/21 1058   ? ?  ? Check-In  ? Supervising physician immediately available to respond to emergencies [facility] MD immediately available   ? Physician(s) Dr. Domenic Polite   ? Location [facility]   ? Staff Present Aundra Dubin, RN, BSN;Heather Otho Ket, BS, Exercise Physiologist   ? Virtual Visit No   ? Medication changes reported     No   ? Fall or balance concerns reported    No   ? Tobacco Cessation No Change   ? Warm-up and Cool-down Performed as group-led instruction   ? Resistance Training Performed Yes   ? VAD Patient? No   ? PAD/SET Patient? No   ?  ? Pain Assessment  ? Currently in Pain? No/denies   ? Pain Score 0-No pain   ? Multiple Pain Sites No   ? ?  ?  ? ?  ? ? ?Capillary Blood Glucose: ?No results found for this or any previous visit (from the past 24 hour(s)). ? ? ? ?Social History  ? ?Tobacco Use  ?Smoking Status Former  ? Packs/day: 1.00  ? Years: 25.00  ? Pack years: 25.00  ? Types: Cigarettes  ? Quit date: 03/27/2010  ? Years since quitting: 11.6  ?Smokeless Tobacco Never  ?Tobacco Comments  ? Quit x 7 years  ? ? ?Goals Met:  ?Independence with exercise equipment ?Exercise tolerated well ?No report of concerns or symptoms today ?Strength training completed today ? ?Goals Unmet:  ?Not Applicable ? ?Comments: Check out at 1200. ? ? ?Dr. Carlyle Dolly is Medical Director for [place] ?

## 2021-11-12 DIAGNOSIS — N186 End stage renal disease: Secondary | ICD-10-CM | POA: Diagnosis not present

## 2021-11-12 DIAGNOSIS — N250 Renal osteodystrophy: Secondary | ICD-10-CM | POA: Diagnosis not present

## 2021-11-12 DIAGNOSIS — D631 Anemia in chronic kidney disease: Secondary | ICD-10-CM | POA: Diagnosis not present

## 2021-11-12 DIAGNOSIS — Z992 Dependence on renal dialysis: Secondary | ICD-10-CM | POA: Diagnosis not present

## 2021-11-12 DIAGNOSIS — D509 Iron deficiency anemia, unspecified: Secondary | ICD-10-CM | POA: Diagnosis not present

## 2021-11-13 DIAGNOSIS — N186 End stage renal disease: Secondary | ICD-10-CM | POA: Diagnosis not present

## 2021-11-13 DIAGNOSIS — D631 Anemia in chronic kidney disease: Secondary | ICD-10-CM | POA: Diagnosis not present

## 2021-11-13 DIAGNOSIS — Z992 Dependence on renal dialysis: Secondary | ICD-10-CM | POA: Diagnosis not present

## 2021-11-13 DIAGNOSIS — D509 Iron deficiency anemia, unspecified: Secondary | ICD-10-CM | POA: Diagnosis not present

## 2021-11-13 DIAGNOSIS — N250 Renal osteodystrophy: Secondary | ICD-10-CM | POA: Diagnosis not present

## 2021-11-14 ENCOUNTER — Encounter (HOSPITAL_COMMUNITY)
Admission: RE | Admit: 2021-11-14 | Discharge: 2021-11-14 | Disposition: A | Discharge status: Home / Self Care | Payer: Medicare Other | Source: Ambulatory Visit | Attending: Internal Medicine | Admitting: Internal Medicine

## 2021-11-14 DIAGNOSIS — N186 End stage renal disease: Secondary | ICD-10-CM | POA: Diagnosis not present

## 2021-11-14 DIAGNOSIS — D509 Iron deficiency anemia, unspecified: Secondary | ICD-10-CM | POA: Diagnosis not present

## 2021-11-14 DIAGNOSIS — I214 Non-ST elevation (NSTEMI) myocardial infarction: Secondary | ICD-10-CM | POA: Diagnosis not present

## 2021-11-14 DIAGNOSIS — Z9861 Coronary angioplasty status: Secondary | ICD-10-CM | POA: Diagnosis not present

## 2021-11-14 DIAGNOSIS — Z992 Dependence on renal dialysis: Secondary | ICD-10-CM | POA: Diagnosis not present

## 2021-11-14 DIAGNOSIS — Z955 Presence of coronary angioplasty implant and graft: Secondary | ICD-10-CM | POA: Insufficient documentation

## 2021-11-14 DIAGNOSIS — N250 Renal osteodystrophy: Secondary | ICD-10-CM | POA: Diagnosis not present

## 2021-11-14 DIAGNOSIS — D631 Anemia in chronic kidney disease: Secondary | ICD-10-CM | POA: Diagnosis not present

## 2021-11-14 NOTE — Progress Notes (Signed)
Daily Session Note ? ?Patient Details  ?Name: [name] [name] ?MRN: [id number] ?Date of Birth: 02/13/1951 ?Referring Provider:   ?Flowsheet Row CARDIAC REHAB PHASE II ORIENTATION from 09/29/2021 in [place]  ?Referring Provider Dr. Kurtis Bushman  ? ?  ? ? ?Encounter Date: 11/14/2021 ? ?Check In: ? Session Check In - 11/14/21 1100   ? ?  ? Check-In  ? Supervising physician immediately available to respond to emergencies [facility] MD immediately available   ? Physician(s) Dr Harl Bowie   ? Location [facility]   ? Staff Present Aundra Dubin, RN, BSN;Heather Otho Ket, BS, Exercise Physiologist;Euell Schiff Hassell Done, RN, BSN   ? Virtual Visit No   ? Medication changes reported     No   ? Fall or balance concerns reported    No   ? Tobacco Cessation No Change   ? Warm-up and Cool-down Performed as group-led instruction   ? Resistance Training Performed Yes   ? VAD Patient? No   ? PAD/SET Patient? No   ?  ? Pain Assessment  ? Currently in Pain? No/denies   ? Pain Score 0-No pain   ? Multiple Pain Sites No   ? ?  ?  ? ?  ? ? ?Capillary Blood Glucose: ?No results found for this or any previous visit (from the past 24 hour(s)). ? ? ? ?Social History  ? ?Tobacco Use  ?Smoking Status Former  ? Packs/day: 1.00  ? Years: 25.00  ? Pack years: 25.00  ? Types: Cigarettes  ? Quit date: 03/27/2010  ? Years since quitting: 11.6  ?Smokeless Tobacco Never  ?Tobacco Comments  ? Quit x 7 years  ? ? ?Goals Met:  ?Independence with exercise equipment ?Exercise tolerated well ?No report of concerns or symptoms today ?Strength training completed today ? ?Goals Unmet:  ?Not Applicable ? ?Comments: Check out at 1200. ? ? ?Dr. Carlyle Dolly is Medical Director for [place] ?

## 2021-11-15 DIAGNOSIS — D631 Anemia in chronic kidney disease: Secondary | ICD-10-CM | POA: Diagnosis not present

## 2021-11-15 DIAGNOSIS — D509 Iron deficiency anemia, unspecified: Secondary | ICD-10-CM | POA: Diagnosis not present

## 2021-11-15 DIAGNOSIS — N250 Renal osteodystrophy: Secondary | ICD-10-CM | POA: Diagnosis not present

## 2021-11-15 DIAGNOSIS — Z992 Dependence on renal dialysis: Secondary | ICD-10-CM | POA: Diagnosis not present

## 2021-11-15 DIAGNOSIS — N186 End stage renal disease: Secondary | ICD-10-CM | POA: Diagnosis not present

## 2021-11-16 ENCOUNTER — Encounter (HOSPITAL_COMMUNITY)
Admission: RE | Admit: 2021-11-16 | Discharge: 2021-11-16 | Disposition: A | Discharge status: Home / Self Care | Payer: Medicare Other | Source: Ambulatory Visit | Attending: Internal Medicine | Admitting: Internal Medicine

## 2021-11-16 DIAGNOSIS — Z955 Presence of coronary angioplasty implant and graft: Secondary | ICD-10-CM

## 2021-11-16 DIAGNOSIS — D509 Iron deficiency anemia, unspecified: Secondary | ICD-10-CM | POA: Diagnosis not present

## 2021-11-16 DIAGNOSIS — D631 Anemia in chronic kidney disease: Secondary | ICD-10-CM | POA: Diagnosis not present

## 2021-11-16 DIAGNOSIS — Z9861 Coronary angioplasty status: Secondary | ICD-10-CM | POA: Diagnosis not present

## 2021-11-16 DIAGNOSIS — I214 Non-ST elevation (NSTEMI) myocardial infarction: Secondary | ICD-10-CM

## 2021-11-16 DIAGNOSIS — N186 End stage renal disease: Secondary | ICD-10-CM | POA: Diagnosis not present

## 2021-11-16 DIAGNOSIS — N250 Renal osteodystrophy: Secondary | ICD-10-CM | POA: Diagnosis not present

## 2021-11-16 DIAGNOSIS — Z992 Dependence on renal dialysis: Secondary | ICD-10-CM | POA: Diagnosis not present

## 2021-11-16 NOTE — Progress Notes (Signed)
Daily Session Note ? ?Patient Details  ?Name: [name] [name] ?MRN: [id number] ?Date of Birth: 1951-03-22 ?Referring Provider:   ?Flowsheet Row CARDIAC REHAB PHASE II ORIENTATION from 09/29/2021 in [place]  ?Referring Provider Dr. Kurtis Bushman  ? ?  ? ? ?Encounter Date: 11/16/2021 ? ?Check In: ? Session Check In - 11/16/21 1125   ? ?  ? Check-In  ? Supervising physician immediately available to respond to emergencies [facility] MD immediately available   ? Physician(s) Dr. Gardiner Rhyme   ? Location [facility]   ? Staff Present Aundra Dubin, RN, BSN;Ott Zimmerle Otho Ket, BS, Exercise Physiologist   ? Virtual Visit No   ? Medication changes reported     No   ? Fall or balance concerns reported    No   ? Tobacco Cessation No Change   ? Warm-up and Cool-down Performed as group-led instruction   ? Resistance Training Performed Yes   ? VAD Patient? No   ? PAD/SET Patient? No   ?  ? Pain Assessment  ? Currently in Pain? No/denies   ? Pain Score 0-No pain   ? Multiple Pain Sites No   ? ?  ?  ? ?  ? ? ?Capillary Blood Glucose: ?No results found for this or any previous visit (from the past 24 hour(s)). ? ? ? ?Social History  ? ?Tobacco Use  ?Smoking Status Former  ? Packs/day: 1.00  ? Years: 25.00  ? Pack years: 25.00  ? Types: Cigarettes  ? Quit date: 03/27/2010  ? Years since quitting: 11.6  ?Smokeless Tobacco Never  ?Tobacco Comments  ? Quit x 7 years  ? ? ?Goals Met:  ?Independence with exercise equipment ?Exercise tolerated well ?No report of concerns or symptoms today ?Strength training completed today ? ?Goals Unmet:  ?Not Applicable ? ?Comments: check out 1200 ? ? ?Dr. Carlyle Dolly is Medical Director for [place] ?

## 2021-11-17 DIAGNOSIS — Z992 Dependence on renal dialysis: Secondary | ICD-10-CM | POA: Diagnosis not present

## 2021-11-17 DIAGNOSIS — D631 Anemia in chronic kidney disease: Secondary | ICD-10-CM | POA: Diagnosis not present

## 2021-11-17 DIAGNOSIS — N250 Renal osteodystrophy: Secondary | ICD-10-CM | POA: Diagnosis not present

## 2021-11-17 DIAGNOSIS — N186 End stage renal disease: Secondary | ICD-10-CM | POA: Diagnosis not present

## 2021-11-17 DIAGNOSIS — D509 Iron deficiency anemia, unspecified: Secondary | ICD-10-CM | POA: Diagnosis not present

## 2021-11-18 ENCOUNTER — Encounter (HOSPITAL_COMMUNITY)
Admission: RE | Admit: 2021-11-18 | Discharge: 2021-11-18 | Disposition: A | Discharge status: Home / Self Care | Payer: Medicare Other | Source: Ambulatory Visit | Attending: Internal Medicine | Admitting: Internal Medicine

## 2021-11-18 DIAGNOSIS — D631 Anemia in chronic kidney disease: Secondary | ICD-10-CM | POA: Diagnosis not present

## 2021-11-18 DIAGNOSIS — I214 Non-ST elevation (NSTEMI) myocardial infarction: Secondary | ICD-10-CM | POA: Diagnosis not present

## 2021-11-18 DIAGNOSIS — D509 Iron deficiency anemia, unspecified: Secondary | ICD-10-CM | POA: Diagnosis not present

## 2021-11-18 DIAGNOSIS — Z9861 Coronary angioplasty status: Secondary | ICD-10-CM | POA: Diagnosis not present

## 2021-11-18 DIAGNOSIS — N250 Renal osteodystrophy: Secondary | ICD-10-CM | POA: Diagnosis not present

## 2021-11-18 DIAGNOSIS — N186 End stage renal disease: Secondary | ICD-10-CM | POA: Diagnosis not present

## 2021-11-18 DIAGNOSIS — Z955 Presence of coronary angioplasty implant and graft: Secondary | ICD-10-CM | POA: Diagnosis not present

## 2021-11-18 DIAGNOSIS — Z992 Dependence on renal dialysis: Secondary | ICD-10-CM | POA: Diagnosis not present

## 2021-11-18 NOTE — Progress Notes (Signed)
Daily Session Note ? ?Patient Details  ?Name: [name] [name] ?MRN: [id number] ?Date of Birth: [birth year] ?Referring Provider:   ?Flowsheet Row CARDIAC REHAB PHASE II ORIENTATION from 09/29/2021 in [place]  ?Referring Provider Dr. Kurtis Bushman  ? ?  ? ? ?Encounter Date: 11/18/2021 ? ?Check In: ? Session Check In - 11/18/21 1100   ? ?  ? Check-In  ? Supervising physician immediately available to respond to emergencies [facility] MD immediately available   ? Physician(s) Dr Radford Pax   ? Location [facility]   ? Staff Present [name], BS, Exercise Physiologist;Laticha Ferrucci Hassell Done, RN, Bjorn Loser, MS, ACSM-CEP, Exercise Physiologist   ? Virtual Visit No   ? Medication changes reported     No   ? Fall or balance concerns reported    No   ? Tobacco Cessation No Change   ? Warm-up and Cool-down Performed as group-led instruction   ? Resistance Training Performed Yes   ? VAD Patient? No   ?  ? Pain Assessment  ? Currently in Pain? No/denies   ? Pain Score 0-No pain   ? Multiple Pain Sites No   ? ?  ?  ? ?  ? ? ?Capillary Blood Glucose: ?No results found for this or any previous visit (from the past 24 hour(s)). ? ? ? ?Social History  ? ?Tobacco Use  ?Smoking Status Former  ? Packs/day: 1.00  ? Years: 25.00  ? Pack years: 25.00  ? Types: Cigarettes  ? Quit date: 03/27/2010  ? Years since quitting: 11.6  ?Smokeless Tobacco Never  ?Tobacco Comments  ? Quit x 7 years  ? ? ?Goals Met:  ?Independence with exercise equipment ?Exercise tolerated well ?No report of concerns or symptoms today ?Strength training completed today ? ?Goals Unmet:  ?Not Applicable ? ?Comments: Check out at 1200 ? ? ?Dr. Carlyle Dolly is Medical Director for [place] ?

## 2021-11-19 DIAGNOSIS — D631 Anemia in chronic kidney disease: Secondary | ICD-10-CM | POA: Diagnosis not present

## 2021-11-19 DIAGNOSIS — Z992 Dependence on renal dialysis: Secondary | ICD-10-CM | POA: Diagnosis not present

## 2021-11-19 DIAGNOSIS — N250 Renal osteodystrophy: Secondary | ICD-10-CM | POA: Diagnosis not present

## 2021-11-19 DIAGNOSIS — D509 Iron deficiency anemia, unspecified: Secondary | ICD-10-CM | POA: Diagnosis not present

## 2021-11-19 DIAGNOSIS — N186 End stage renal disease: Secondary | ICD-10-CM | POA: Diagnosis not present

## 2021-11-20 DIAGNOSIS — D631 Anemia in chronic kidney disease: Secondary | ICD-10-CM | POA: Diagnosis not present

## 2021-11-20 DIAGNOSIS — Z992 Dependence on renal dialysis: Secondary | ICD-10-CM | POA: Diagnosis not present

## 2021-11-20 DIAGNOSIS — D509 Iron deficiency anemia, unspecified: Secondary | ICD-10-CM | POA: Diagnosis not present

## 2021-11-20 DIAGNOSIS — N186 End stage renal disease: Secondary | ICD-10-CM | POA: Diagnosis not present

## 2021-11-20 DIAGNOSIS — N250 Renal osteodystrophy: Secondary | ICD-10-CM | POA: Diagnosis not present

## 2021-11-21 ENCOUNTER — Encounter (HOSPITAL_COMMUNITY)
Admission: RE | Admit: 2021-11-21 | Discharge: 2021-11-21 | Disposition: A | Discharge status: Home / Self Care | Payer: Medicare Other | Source: Ambulatory Visit | Attending: Internal Medicine | Admitting: Internal Medicine

## 2021-11-21 ENCOUNTER — Telehealth: Payer: Self-pay | Admitting: Nurse Practitioner

## 2021-11-21 VITALS — Wt 239.2 lb

## 2021-11-21 DIAGNOSIS — N250 Renal osteodystrophy: Secondary | ICD-10-CM | POA: Diagnosis not present

## 2021-11-21 DIAGNOSIS — I214 Non-ST elevation (NSTEMI) myocardial infarction: Secondary | ICD-10-CM | POA: Diagnosis not present

## 2021-11-21 DIAGNOSIS — Z955 Presence of coronary angioplasty implant and graft: Secondary | ICD-10-CM | POA: Diagnosis not present

## 2021-11-21 DIAGNOSIS — D631 Anemia in chronic kidney disease: Secondary | ICD-10-CM | POA: Diagnosis not present

## 2021-11-21 DIAGNOSIS — R059 Cough, unspecified: Secondary | ICD-10-CM | POA: Diagnosis not present

## 2021-11-21 DIAGNOSIS — Z9861 Coronary angioplasty status: Secondary | ICD-10-CM

## 2021-11-21 DIAGNOSIS — N186 End stage renal disease: Secondary | ICD-10-CM | POA: Diagnosis not present

## 2021-11-21 DIAGNOSIS — D509 Iron deficiency anemia, unspecified: Secondary | ICD-10-CM | POA: Diagnosis not present

## 2021-11-21 DIAGNOSIS — Z992 Dependence on renal dialysis: Secondary | ICD-10-CM | POA: Diagnosis not present

## 2021-11-21 DIAGNOSIS — R051 Acute cough: Secondary | ICD-10-CM | POA: Diagnosis not present

## 2021-11-21 DIAGNOSIS — Z20822 Contact with and (suspected) exposure to covid-19: Secondary | ICD-10-CM | POA: Diagnosis not present

## 2021-11-21 NOTE — Progress Notes (Signed)
Daily Session Note ? ?Patient Details  ?Name: [name] [name] ?MRN: [id number] ?Date of Birth: 12-11-1950 ?Referring Provider:   ?Flowsheet Row CARDIAC REHAB PHASE II ORIENTATION from 09/29/2021 in [place]  ?Referring Provider Dr. Kurtis Bushman  ? ?  ? ? ?Encounter Date: 11/21/2021 ? ?Check In: ? Session Check In - 11/21/21 1100   ? ?  ? Check-In  ? Supervising physician immediately available to respond to emergencies [facility] MD immediately available   ? Physician(s) [place]   ? Location [facility]   ? Staff Present [name], BS, Exercise Physiologist;Paidyn Mcferran Wynetta Emery, RN, Bjorn Loser, MS, ACSM-CEP, Exercise Physiologist   ? Virtual Visit No   ? Medication changes reported     No   ? Fall or balance concerns reported    No   ? Tobacco Cessation No Change   ? Warm-up and Cool-down Performed as group-led instruction   ? Resistance Training Performed Yes   ? VAD Patient? No   ? PAD/SET Patient? No   ?  ? Pain Assessment  ? Currently in Pain? No/denies   ? Pain Score 0-No pain   ? Multiple Pain Sites No   ? ?  ?  ? ?  ? ? ?Capillary Blood Glucose: ?No results found for this or any previous visit (from the past 24 hour(s)). ? ? ? ?Social History  ? ?Tobacco Use  ?Smoking Status Former  ? Packs/day: 1.00  ? Years: 25.00  ? Pack years: 25.00  ? Types: Cigarettes  ? Quit date: 03/27/2010  ? Years since quitting: 11.6  ?Smokeless Tobacco Never  ?Tobacco Comments  ? Quit x 7 years  ? ? ?Goals Met:  ?Independence with exercise equipment ?Exercise tolerated well ?No report of concerns or symptoms today ?Strength training completed today ? ?Goals Unmet:  ?Not Applicable ? ?Comments: Check out 1200. ? ? ?Dr. Carlyle Dolly is Medical Director for [place] ?

## 2021-11-21 NOTE — Telephone Encounter (Signed)
Spoke to patient's wife.Advised ok to have lipid panel done at [facility] in [place].Orders already in chart. ?

## 2021-11-21 NOTE — Telephone Encounter (Signed)
Patient's wife calling to see if the patient's lab orders are released so he can get his lab work at [facility] in [place]. ?

## 2021-11-22 DIAGNOSIS — Z992 Dependence on renal dialysis: Secondary | ICD-10-CM | POA: Diagnosis not present

## 2021-11-22 DIAGNOSIS — N186 End stage renal disease: Secondary | ICD-10-CM | POA: Diagnosis not present

## 2021-11-22 DIAGNOSIS — D509 Iron deficiency anemia, unspecified: Secondary | ICD-10-CM | POA: Diagnosis not present

## 2021-11-22 DIAGNOSIS — N250 Renal osteodystrophy: Secondary | ICD-10-CM | POA: Diagnosis not present

## 2021-11-22 DIAGNOSIS — E1142 Type 2 diabetes mellitus with diabetic polyneuropathy: Secondary | ICD-10-CM | POA: Diagnosis not present

## 2021-11-22 DIAGNOSIS — B351 Tinea unguium: Secondary | ICD-10-CM | POA: Diagnosis not present

## 2021-11-22 DIAGNOSIS — D631 Anemia in chronic kidney disease: Secondary | ICD-10-CM | POA: Diagnosis not present

## 2021-11-23 ENCOUNTER — Encounter (HOSPITAL_COMMUNITY)
Admission: RE | Admit: 2021-11-23 | Discharge: 2021-11-23 | Disposition: A | Discharge status: Home / Self Care | Payer: Medicare Other | Source: Ambulatory Visit | Attending: Internal Medicine | Admitting: Internal Medicine

## 2021-11-23 ENCOUNTER — Other Ambulatory Visit: Payer: Self-pay | Admitting: Internal Medicine

## 2021-11-23 ENCOUNTER — Other Ambulatory Visit: Payer: Self-pay

## 2021-11-23 ENCOUNTER — Telehealth: Payer: Self-pay | Admitting: Internal Medicine

## 2021-11-23 DIAGNOSIS — N186 End stage renal disease: Secondary | ICD-10-CM | POA: Diagnosis not present

## 2021-11-23 DIAGNOSIS — E785 Hyperlipidemia, unspecified: Secondary | ICD-10-CM | POA: Diagnosis not present

## 2021-11-23 DIAGNOSIS — N250 Renal osteodystrophy: Secondary | ICD-10-CM | POA: Diagnosis not present

## 2021-11-23 DIAGNOSIS — Z9861 Coronary angioplasty status: Secondary | ICD-10-CM | POA: Diagnosis not present

## 2021-11-23 DIAGNOSIS — D631 Anemia in chronic kidney disease: Secondary | ICD-10-CM | POA: Diagnosis not present

## 2021-11-23 DIAGNOSIS — Z955 Presence of coronary angioplasty implant and graft: Secondary | ICD-10-CM | POA: Diagnosis not present

## 2021-11-23 DIAGNOSIS — I214 Non-ST elevation (NSTEMI) myocardial infarction: Secondary | ICD-10-CM

## 2021-11-23 DIAGNOSIS — Z992 Dependence on renal dialysis: Secondary | ICD-10-CM | POA: Diagnosis not present

## 2021-11-23 DIAGNOSIS — D509 Iron deficiency anemia, unspecified: Secondary | ICD-10-CM | POA: Diagnosis not present

## 2021-11-23 NOTE — Telephone Encounter (Signed)
Ew Message: ? ? ? ? ?Patient says he needs a lab order, so he can have his lab work at [facility] in [place]. He went yesterday , he could not get the lab work. ?

## 2021-11-23 NOTE — Telephone Encounter (Signed)
Patient called for lab order prior to Dr. Debara Pickett visit. Lipid panel ordered. Patient is fasting. ?

## 2021-11-23 NOTE — Progress Notes (Signed)
Daily Session Note ? ?Patient Details  ?Name: [name] [name] ?MRN: [id number] ?Date of Birth: 1950/11/04 ?Referring Provider:   ?Flowsheet Row CARDIAC REHAB PHASE II ORIENTATION from 09/29/2021 in [place]  ?Referring Provider Dr. Kurtis Bushman  ? ?  ? ? ?Encounter Date: 11/23/2021 ? ?Check In: ? Session Check In - 11/23/21 1100   ? ?  ? Check-In  ? Supervising physician immediately available to respond to emergencies [facility] MD immediately available   ? Physician(s) Dr. Harrington Challenger   ? Location [facility]   ? Staff Present Geanie Cooley, RN;Debra Wynetta Emery, RN, Bjorn Loser, MS, ACSM-CEP, Exercise Physiologist;Daphyne Hassell Done, RN, BSN   ? Virtual Visit No   ? Medication changes reported     No   ? Fall or balance concerns reported    No   ? Tobacco Cessation No Change   ? Warm-up and Cool-down Performed as group-led instruction   ? Resistance Training Performed Yes   ? VAD Patient? No   ? PAD/SET Patient? No   ?  ? Pain Assessment  ? Currently in Pain? No/denies   ? Pain Score 0-No pain   ? Multiple Pain Sites No   ? ?  ?  ? ?  ? ? ?Capillary Blood Glucose: ?No results found for this or any previous visit (from the past 24 hour(s)). ? ? ? ?Social History  ? ?Tobacco Use  ?Smoking Status Former  ? Packs/day: 1.00  ? Years: 25.00  ? Pack years: 25.00  ? Types: Cigarettes  ? Quit date: 03/27/2010  ? Years since quitting: 11.6  ?Smokeless Tobacco Never  ?Tobacco Comments  ? Quit x 7 years  ? ? ?Goals Met:  ?Independence with exercise equipment ?Exercise tolerated well ?No report of concerns or symptoms today ?Strength training completed today ? ?Goals Unmet:  ?Not Applicable ? ?Comments: checkout @ 12:00pm ? ? ?Dr. Carlyle Dolly is Medical Director for [place] ?

## 2021-11-24 DIAGNOSIS — N186 End stage renal disease: Secondary | ICD-10-CM | POA: Diagnosis not present

## 2021-11-24 DIAGNOSIS — Z992 Dependence on renal dialysis: Secondary | ICD-10-CM | POA: Diagnosis not present

## 2021-11-24 DIAGNOSIS — D509 Iron deficiency anemia, unspecified: Secondary | ICD-10-CM | POA: Diagnosis not present

## 2021-11-24 DIAGNOSIS — D631 Anemia in chronic kidney disease: Secondary | ICD-10-CM | POA: Diagnosis not present

## 2021-11-24 DIAGNOSIS — N250 Renal osteodystrophy: Secondary | ICD-10-CM | POA: Diagnosis not present

## 2021-11-24 LAB — LIPID PANEL
Cholesterol: 99 mg/dL (ref <200)
HDL: 29 mg/dL — ABNORMAL LOW (ref >=40)
LDL Cholesterol (Calc): 47 mg/dL (calc)
Non-HDL Cholesterol (Calc): 70 mg/dL (calc) (ref <130)
Total CHOL/HDL Ratio: 3.4 (calc) (ref <5.0)
Triglycerides: 148 mg/dL (ref <150)

## 2021-11-25 ENCOUNTER — Encounter (HOSPITAL_COMMUNITY): Payer: Medicare Other

## 2021-11-25 DIAGNOSIS — N250 Renal osteodystrophy: Secondary | ICD-10-CM | POA: Diagnosis not present

## 2021-11-25 DIAGNOSIS — D509 Iron deficiency anemia, unspecified: Secondary | ICD-10-CM | POA: Diagnosis not present

## 2021-11-25 DIAGNOSIS — N186 End stage renal disease: Secondary | ICD-10-CM | POA: Diagnosis not present

## 2021-11-25 DIAGNOSIS — Z992 Dependence on renal dialysis: Secondary | ICD-10-CM | POA: Diagnosis not present

## 2021-11-25 DIAGNOSIS — D631 Anemia in chronic kidney disease: Secondary | ICD-10-CM | POA: Diagnosis not present

## 2021-11-26 DIAGNOSIS — D509 Iron deficiency anemia, unspecified: Secondary | ICD-10-CM | POA: Diagnosis not present

## 2021-11-26 DIAGNOSIS — N250 Renal osteodystrophy: Secondary | ICD-10-CM | POA: Diagnosis not present

## 2021-11-26 DIAGNOSIS — N186 End stage renal disease: Secondary | ICD-10-CM | POA: Diagnosis not present

## 2021-11-26 DIAGNOSIS — Z992 Dependence on renal dialysis: Secondary | ICD-10-CM | POA: Diagnosis not present

## 2021-11-26 DIAGNOSIS — D631 Anemia in chronic kidney disease: Secondary | ICD-10-CM | POA: Diagnosis not present

## 2021-11-27 DIAGNOSIS — Z992 Dependence on renal dialysis: Secondary | ICD-10-CM | POA: Diagnosis not present

## 2021-11-27 DIAGNOSIS — N250 Renal osteodystrophy: Secondary | ICD-10-CM | POA: Diagnosis not present

## 2021-11-27 DIAGNOSIS — N186 End stage renal disease: Secondary | ICD-10-CM | POA: Diagnosis not present

## 2021-11-27 DIAGNOSIS — D631 Anemia in chronic kidney disease: Secondary | ICD-10-CM | POA: Diagnosis not present

## 2021-11-27 DIAGNOSIS — D509 Iron deficiency anemia, unspecified: Secondary | ICD-10-CM | POA: Diagnosis not present

## 2021-11-28 ENCOUNTER — Encounter (HOSPITAL_COMMUNITY)
Admission: RE | Admit: 2021-11-28 | Discharge: 2021-11-28 | Disposition: A | Discharge status: Home / Self Care | Payer: Medicare Other | Source: Ambulatory Visit | Attending: Internal Medicine | Admitting: Internal Medicine

## 2021-11-28 DIAGNOSIS — Z9861 Coronary angioplasty status: Secondary | ICD-10-CM

## 2021-11-28 DIAGNOSIS — I214 Non-ST elevation (NSTEMI) myocardial infarction: Secondary | ICD-10-CM

## 2021-11-28 DIAGNOSIS — D509 Iron deficiency anemia, unspecified: Secondary | ICD-10-CM | POA: Diagnosis not present

## 2021-11-28 DIAGNOSIS — Z992 Dependence on renal dialysis: Secondary | ICD-10-CM | POA: Diagnosis not present

## 2021-11-28 DIAGNOSIS — N250 Renal osteodystrophy: Secondary | ICD-10-CM | POA: Diagnosis not present

## 2021-11-28 DIAGNOSIS — Z955 Presence of coronary angioplasty implant and graft: Secondary | ICD-10-CM

## 2021-11-28 DIAGNOSIS — N186 End stage renal disease: Secondary | ICD-10-CM | POA: Diagnosis not present

## 2021-11-28 DIAGNOSIS — D631 Anemia in chronic kidney disease: Secondary | ICD-10-CM | POA: Diagnosis not present

## 2021-11-28 NOTE — Progress Notes (Signed)
Daily Session Note ? ?Patient Details  ?Name: [name] [name] ?MRN: [id number] ?Date of Birth: [birth year] ?Referring Provider:   ?Flowsheet Row CARDIAC REHAB PHASE II ORIENTATION from 09/29/2021 in [place]  ?Referring Provider Dr. Kurtis Bushman  ? ?  ? ? ?Encounter Date: 11/28/2021 ? ?Check In: ? Session Check In - 11/28/21 1051   ? ?  ? Check-In  ? Supervising physician immediately available to respond to emergencies [facility] MD immediately available   ? Physician(s) Dr Harl Bowie   ? Location [facility]   ? Staff Present Aundra Dubin, RN, Bjorn Loser, MS, ACSM-CEP, Exercise Physiologist;Alton Tremblay Hassell Done, RN, BSN;Heather Otho Ket, BS, Exercise Physiologist   ? Virtual Visit No   ? Medication changes reported     No   ? Fall or balance concerns reported    No   ? Tobacco Cessation No Change   ? Warm-up and Cool-down Performed as group-led instruction   ? Resistance Training Performed Yes   ? VAD Patient? No   ? PAD/SET Patient? No   ?  ? Pain Assessment  ? Currently in Pain? No/denies   ? Pain Score 0-No pain   ? Multiple Pain Sites No   ? ?  ?  ? ?  ? ? ?Capillary Blood Glucose: ?No results found for this or any previous visit (from the past 24 hour(s)). ? ? ? ?Social History  ? ?Tobacco Use  ?Smoking Status Former  ? Packs/day: 1.00  ? Years: 25.00  ? Pack years: 25.00  ? Types: Cigarettes  ? Quit date: 03/27/2010  ? Years since quitting: 11.6  ?Smokeless Tobacco Never  ?Tobacco Comments  ? Quit x 7 years  ? ? ?Goals Met:  ?Independence with exercise equipment ?Exercise tolerated well ?No report of concerns or symptoms today ?Strength training completed today ? ?Goals Unmet:  ?Not Applicable ? ?Comments: checkout at 1200. ? ? ?Dr. Carlyle Dolly is Medical Director for [place] ?

## 2021-11-29 DIAGNOSIS — N250 Renal osteodystrophy: Secondary | ICD-10-CM | POA: Diagnosis not present

## 2021-11-29 DIAGNOSIS — Z992 Dependence on renal dialysis: Secondary | ICD-10-CM | POA: Diagnosis not present

## 2021-11-29 DIAGNOSIS — D631 Anemia in chronic kidney disease: Secondary | ICD-10-CM | POA: Diagnosis not present

## 2021-11-29 DIAGNOSIS — D509 Iron deficiency anemia, unspecified: Secondary | ICD-10-CM | POA: Diagnosis not present

## 2021-11-29 DIAGNOSIS — N186 End stage renal disease: Secondary | ICD-10-CM | POA: Diagnosis not present

## 2021-11-30 ENCOUNTER — Encounter (HOSPITAL_COMMUNITY)
Admission: RE | Admit: 2021-11-30 | Discharge: 2021-11-30 | Disposition: A | Discharge status: Home / Self Care | Payer: Medicare Other | Source: Ambulatory Visit | Attending: Internal Medicine | Admitting: Internal Medicine

## 2021-11-30 DIAGNOSIS — D631 Anemia in chronic kidney disease: Secondary | ICD-10-CM | POA: Diagnosis not present

## 2021-11-30 DIAGNOSIS — Z9861 Coronary angioplasty status: Secondary | ICD-10-CM | POA: Diagnosis not present

## 2021-11-30 DIAGNOSIS — Z992 Dependence on renal dialysis: Secondary | ICD-10-CM | POA: Diagnosis not present

## 2021-11-30 DIAGNOSIS — N186 End stage renal disease: Secondary | ICD-10-CM | POA: Diagnosis not present

## 2021-11-30 DIAGNOSIS — Z955 Presence of coronary angioplasty implant and graft: Secondary | ICD-10-CM

## 2021-11-30 DIAGNOSIS — D509 Iron deficiency anemia, unspecified: Secondary | ICD-10-CM | POA: Diagnosis not present

## 2021-11-30 DIAGNOSIS — I214 Non-ST elevation (NSTEMI) myocardial infarction: Secondary | ICD-10-CM

## 2021-11-30 DIAGNOSIS — N250 Renal osteodystrophy: Secondary | ICD-10-CM | POA: Diagnosis not present

## 2021-11-30 NOTE — Progress Notes (Signed)
Daily Session Note ? ?Patient Details  ?Name: [name] [name] ?MRN: [id number] ?Date of Birth: [birth year] ?Referring Provider:   ?Flowsheet Row CARDIAC REHAB PHASE II ORIENTATION from 09/29/2021 in [place]  ?Referring Provider Dr. Kurtis Bushman  ? ?  ? ? ?Encounter Date: 11/30/2021 ? ?Check In: ? Session Check In - 11/30/21 1100   ? ?  ? Check-In  ? Supervising physician immediately available to respond to emergencies [facility] MD immediately available   ? Physician(s) Dr Harl Bowie   ? Location [facility]   ? Staff Present Aundra Dubin, RN, Joanette Gula, RN, BSN;Heather Otho Ket, BS, Exercise Physiologist;Macyn Remmert Windy Kalata, RN   ? Virtual Visit No   ? Medication changes reported     No   ? Fall or balance concerns reported    No   ? Tobacco Cessation No Change   ? Warm-up and Cool-down Performed as group-led instruction   ? Resistance Training Performed Yes   ? VAD Patient? No   ? PAD/SET Patient? No   ?  ? Pain Assessment  ? Currently in Pain? No/denies   ? Pain Score 0-No pain   ? Multiple Pain Sites No   ? ?  ?  ? ?  ? ? ?Capillary Blood Glucose: ?No results found for this or any previous visit (from the past 24 hour(s)). ? ? ? ?Social History  ? ?Tobacco Use  ?Smoking Status Former  ? Packs/day: 1.00  ? Years: 25.00  ? Pack years: 25.00  ? Types: Cigarettes  ? Quit date: 03/27/2010  ? Years since quitting: 11.6  ?Smokeless Tobacco Never  ?Tobacco Comments  ? Quit x 7 years  ? ? ?Goals Met:  ?Independence with exercise equipment ?Exercise tolerated well ?No report of concerns or symptoms today ?Strength training completed today ? ?Goals Unmet:  ?Not Applicable ? ?Comments: check out @ 12:00pm ? ? ?Dr. Carlyle Dolly is Medical Director for [place] ?

## 2021-12-01 ENCOUNTER — Ambulatory Visit (INDEPENDENT_AMBULATORY_CARE_PROVIDER_SITE_OTHER): Payer: Medicare Other | Admitting: Internal Medicine

## 2021-12-01 VITALS — BP 155/88 | HR 71 | Ht 64.0 in | Wt 236.8 lb

## 2021-12-01 DIAGNOSIS — Z992 Dependence on renal dialysis: Secondary | ICD-10-CM | POA: Diagnosis not present

## 2021-12-01 DIAGNOSIS — D509 Iron deficiency anemia, unspecified: Secondary | ICD-10-CM | POA: Diagnosis not present

## 2021-12-01 DIAGNOSIS — I2510 Atherosclerotic heart disease of native coronary artery without angina pectoris: Secondary | ICD-10-CM | POA: Diagnosis not present

## 2021-12-01 DIAGNOSIS — I10 Essential (primary) hypertension: Secondary | ICD-10-CM | POA: Diagnosis not present

## 2021-12-01 DIAGNOSIS — N186 End stage renal disease: Secondary | ICD-10-CM

## 2021-12-01 DIAGNOSIS — N250 Renal osteodystrophy: Secondary | ICD-10-CM | POA: Diagnosis not present

## 2021-12-01 DIAGNOSIS — I2111 ST elevation (STEMI) myocardial infarction involving right coronary artery: Secondary | ICD-10-CM | POA: Diagnosis not present

## 2021-12-01 DIAGNOSIS — D631 Anemia in chronic kidney disease: Secondary | ICD-10-CM | POA: Diagnosis not present

## 2021-12-01 DIAGNOSIS — E785 Hyperlipidemia, unspecified: Secondary | ICD-10-CM

## 2021-12-01 NOTE — Patient Instructions (Signed)
Medication Instructions:  Your physician recommends that you continue on your current medications as directed. Please refer to the Current Medication list given to you today.  *If you need a refill on your cardiac medications before your next appointment, please call your pharmacy*   Follow-Up: At [facility], you and your health needs are our priority.  As part of our continuing mission to provide you with exceptional heart care, we have created designated Provider Care Teams.  These Care Teams include your primary Cardiologist (physician) and Advanced Practice Providers (APPs -  Physician Assistants and Nurse Practitioners) who all work together to provide you with the care you need, when you need it.  We recommend signing up for the patient portal called "MyChart".  Sign up information is provided on this After Visit Summary.  MyChart is used to connect with patients for Virtual Visits (Telemedicine).  Patients are able to view lab/test results, encounter notes, upcoming appointments, etc.  Non-urgent messages can be sent to your provider as well.   To learn more about what you can do with MyChart, go to [email or web address].    Your next appointment:    January 2024 with Dr. Debara Pickett

## 2021-12-01 NOTE — Progress Notes (Signed)
OFFICE NOTE  Chief Complaint:  No complaint  Primary Care Physician: Carrolyn Meiers, MD  HPI:  [name] [name] Is a pleasant 71 year old male with a history of insulin-dependent diabetes, dyslipidemia, and hypertension. He's been a diabetic since 1999. He's been on insulin for only 3-4 years. He was a former patient of Dr. Rollene Fare who ordered a stress test in 2014. This demonstrated reversible inferoapical ischemia and was read as moderate risk. He ultimately underwent heart catheterization by Dr. Ellyn Hack on 03/28/2013 which demonstrated a small, possibly minimal aneurysmal bleb in the LAD but no significant disease. There is a dominant RCA with no disease. Essentially there was no significant obstructive coronary disease. At the time his EKG did show inferior T-wave abnormalities and very small R waves but no evidence for Q waves. Today he is referred back for abnormalities EKG which directly compared to his old EKG shows no significant change. There is an IVCD and very small R waves inferiorly, which the computer may have this read as inferior infarct. I do not see any significant change compared to his previous studies. He denies any new or worsening chest pain or shortness of breath.  [name] returns today for follow-up. Overall he is feeling fairly well. He denies any chest pain or shortness of breath. Blood pressure is slightly elevated to 162/94, however recheck was 140/84. Please not been able to lose much weight. He is on chronic Plavix therapy due to what was thought to be plaque rupture but did not require stent by catheterization in 2014. This was a preferred agent over aspirin and he's remained on that, but certainly could come off of that if he were to undergo any procedures he is contemplating a penile implant for erectile dysfunction.  05/01/2016  [name] was seen today in follow-up. He recently saw [name], NP, in April 2016 for follow-up. Blood pressure  was somewhat elevated at the time. It was not is ambulatory due to problems with his knee. There was a discussion about stopping Plavix and keeping him on aspirin however he remained on Plavix. Unfortunately he has stage IV chronic kidney disease and there is discussion about placement of a fistula. He scheduled for upper extremity Dopplers at the end of this month and follow-up with Dr. Oneida Alar. He had a number of questions today regarding dialysis and his fistula, but I reassured him based on his creatinine that he should consider this even if his numbers seem to have stabilized. He denies any chest pain or worsening shortness of breath. He did get new CPAP equipment but reports intermittent compliance with it. He still has some daytime fatigue.  10/27/2016  [name] was seen today in follow-up. He underwent recent placement of a fistula left upper arm. This has a positive thrill today. Blood pressure is elevated somewhat 152/88. Recently his creatinine was increased up to 5.08 which is progression from 3.74 in December. I suspect he will have to start on dialysis soon. Otherwise hemoglobin A1c is stable around 7.0. He is cholesterol also has looked reasonably well controlled with total cholesterol 146, HDL-C 36, LDL-C 72 and triglycerides 190.  11/13/2017  [name] was seen today for routine follow-up.  Overall he is doing very well.  He denies any chest pain or worsening shortness of breath.  His creatinine remains around 5.7.  This is been fairly stable and he is managed to remain off dialysis.  He does have left upper extremity fistula.  Hemoglobin A1c  is well-controlled at 6.2 and is followed by Dr. Dorris Fetch in [place].  His total cholesterol was 110, HDL 38, LDL 54 triglycerides 101, demonstrating excellent control on simvastatin.  Blood pressure is at goal today 136/78.  11/14/2019  [name] is seen today in follow-up.  He continues on home peritoneal dialysis.  Blood pressure was noted to  be very low today at 86/56.  He denies any fevers or chills or other signs or symptoms of infection.  His dialysis center had recommended stopping his hydralazine.  Despite this his blood pressure remains low.  He is also on amlodipine, metoprolol and losartan.  He reports some fatigue but denies chest pain or shortness of breath.  01/28/2020  [name] is seen today in follow-up.  Overall he continues to do well.  He is on PD which he is now doing at night.  He is overdue for a lipid profile which we will order.  He says he stopped Plavix and aspirin.  He has multiple indications for this including diabetes and prior history of coronary disease.  He denies any chest pain or shortness of breath.  Blood pressures well controlled today 120/70 and at times at home can be as low as the 12X systolic but he is asymptomatic with this.  03/01/2021  [name] returns today for follow-up.  Overall he says he is doing well.  He continues with home peritoneal dialysis.  Denies any chest pain.  He has had some worsening shortness of breath but primarily I suspect this is due to wheezing and chronic lung disease.  He has a follow-up with pulmonary in September.  Cholesterol is fairly low with total 117, triglycerides 157, HDL 27 and LDL 63.  Blood pressure appears to be well controlled today 128/88.  EKG is unchanged showing sinus rhythm with first-degree AV block and nonspecific T wave changes.  12/01/2021  [name] returns today for follow-up.  Overall he says he is doing well.  Back in January he underwent cardiac catheterization and having a drug-eluting stent to the proximal RCA.  There were some mid and distal vessel lesions that seem to improve with more flow.  He had some residual plaque in the LAD and circumflex marginal branch as well.  Plan was for 1 year of dual antiplatelet therapy with aspirin and Brilinta.  He seems to be doing well with that.  He reports being taken off of metoprolol due to lower  blood pressures at dialysis.  He is undergoing cardiac rehab seems to be doing well.  He denies any chest pain.  Recent lipids are well controlled with total cholesterol 99, HDL 29 current triglycerides 148 and LDL 47.  A1c is 6.6%.  PMHx:  Past Medical History:  Diagnosis Date   Anemia    Arthritis    Asthma    Bell palsy    CAD (coronary artery disease)    a. 2014 MV: abnl w/ infap ischemia; b. 03/2013 Cath: aneurysmal bleb in the LAD w/ otw nonobs dzs-->Med Rx.   Chronic back pain    Chronic knee pain    a. 09/2015 s/p R TKA.   Chronic pain    Chronic shoulder pain    Chronic sinusitis    CKD (chronic kidney disease), stage IV (HCC)    stage 4 per office visit note of Dr Lowanda Foster on 05/2015    COPD (chronic obstructive pulmonary disease) ([place])    Diabetes mellitus without complication ([place])    type II  Essential hypertension    GERD (gastroesophageal reflux disease)    Gout    Gout    Hepatomegaly    noted on noncontrast CT 2015   History of hiatal hernia    Hyperlipidemia    Lateral meniscus tear    Obesity    Truncal   Obstructive sleep apnea    does not use cpap    On home oxygen therapy    uses 2l when is going somewhere per patient    PUD (peptic ulcer disease)    remote, reports f/u EGD about 8 years ago unremarkable    Reactive airway disease    related to exposure to chemical during 9/11   Renal insufficiency    Sinusitis    Vitamin D deficiency     Past Surgical History:  Procedure Laterality Date   ASAD LT SHOULDER  12/15/2008   left shoulder   AV FISTULA PLACEMENT Left 08/09/2016   Procedure: BRACHIOCEPHALIC ARTERIOVENOUS (AV) FISTULA CREATION LEFT ARM;  Surgeon: [name], MD;  Location: [facility];  Service: Vascular;  Laterality: Left;   CAPD INSERTION N/A 10/07/2018   Procedure: LAPAROSCOPIC PERITONEAL CATHETER PLACEMENT;  Surgeon: Mickeal Skinner, MD;  Location: [facility];  Service: General;  Laterality: N/A;   CATARACT EXTRACTION W/PHACO  Left 03/28/2016   Procedure: CATARACT EXTRACTION PHACO AND INTRAOCULAR LENS PLACEMENT LEFT EYE;  Surgeon: [name], MD;  Location: [facility];  Service: Ophthalmology;  Laterality: Left;  CDE: 4.77   CATARACT EXTRACTION W/PHACO Right 04/11/2016   Procedure: CATARACT EXTRACTION PHACO AND INTRAOCULAR LENS PLACEMENT RIGHT EYE; CDE:  4.74;  Surgeon: [name], MD;  Location: [facility];  Service: Ophthalmology;  Laterality: Right;   COLONOSCOPY  10/15/2008   Fields: Rectal polyp obliterated, not retrieved, hemorrhoids, single ascending colon diverticulum near the CV. Next colonoscopy April 2020   COLONOSCOPY N/A 12/25/2014   SLF: 1. Colorectal polyps (2) removed 2. Small internal hemorrhoids 3. the left colon is severely redundant. hyperplastic polyps   CORONARY STENT INTERVENTION N/A 07/25/2021   Procedure: CORONARY STENT INTERVENTION;  Surgeon: Sherren Mocha, MD;  Location: [facility];  Service: Cardiovascular;  Laterality: N/A;   DOPPLER ECHOCARDIOGRAPHY     ESOPHAGOGASTRODUODENOSCOPY N/A 12/25/2014   SLF: 1. Anemia most likely due to CRI, gastritis, gastric polyps 2. Moderate non-erosive gastriits and mild duodenitis.  3.TWo large gstric polyps removed.    EYE SURGERY  12/22/2010   tear duct probing-[place]   FOREIGN BODY REMOVAL  03/29/2011   Procedure: REMOVAL FOREIGN BODY EXTREMITY;  Surgeon: Arther Abbott, MD;  Location: [facility];  Service: Orthopedics;  Laterality: Right;  Removal Foreign Body Right Thumb   IR FLUORO GUIDE CV LINE RIGHT  08/06/2018   IR US GUIDE VASC ACCESS RIGHT  08/06/2018   KNEE ARTHROSCOPY  10/16/2007   left   KNEE ARTHROSCOPY WITH LATERAL MENISECTOMY Right 10/14/2015   Procedure: LEFT KNEE ARTHROSCOPY WITH PARTIAL LATERAL MENISECTOMY;  Surgeon: Carole Civil, MD;  Location: [facility];  Service: Orthopedics;  Laterality: Right;   LEFT HEART CATH AND CORONARY ANGIOGRAPHY N/A 07/25/2021   Procedure: LEFT HEART CATH AND CORONARY ANGIOGRAPHY;  Surgeon: Sherren Mocha, MD;  Location: [facility];  Service: Cardiovascular;  Laterality: N/A;   LEFT HEART CATHETERIZATION WITH CORONARY ANGIOGRAM N/A 03/28/2013   Procedure: LEFT HEART CATHETERIZATION WITH CORONARY ANGIOGRAM;  Surgeon: Leonie Man, MD;  Location: [facility];  Service: Cardiovascular;  Laterality: N/A;   NM MYOVIEW LTD  PENILE PROSTHESIS IMPLANT N/A 08/16/2015   Procedure: PENILE PROTHESIS INFLATABLE, three piece, Excisional biopsy of Penile ulcer, Penile molding;  Surgeon: Carolan Clines, MD;  Location[facility];  Service: Urology;  Laterality: N/A;   PENILE PROSTHESIS IMPLANT N/A 12/24/2017   Procedure: REMOVAL AND  REPLACEMENT  COLOPLAST PENILE PROSTHESIS;  Surgeon: Lucas Mallow, MD;  [facility];  Service: Urology;  Laterality: N/A;   QUADRICEPS TENDON REPAIR  07/21/2011   Procedure: REPAIR QUADRICEP TENDON;  Surgeon: Arther Abbott, MD;  Location: [facility];  Service: Orthopedics;  Laterality: Right;   TOENAIL EXCISION     removed [id number]   UMBILICAL HERNIA REPAIR  07/17/2005   [place]    FAMHx:  Family History  Problem Relation Age of Onset   Hypertension Mother        MI   Cancer Mother        breast    Diabetes Mother    Diabetes Father    Hypertension Father    Hypertension Sister    Diabetes Sister    Arthritis Other    Asthma Other    Lung disease Other    Anesthesia problems Neg Hx    Hypotension Neg Hx    Malignant hyperthermia Neg Hx    Pseudochol deficiency Neg Hx    Colon cancer Neg Hx     SOCHx:   reports that he quit smoking about 11 years ago. His smoking use included cigarettes. He has a 25.00 pack-year smoking history. He has never used smokeless tobacco. He reports current alcohol use. He reports current drug use. Drug: Marijuana.  ALLERGIES:  Allergies  Allergen Reactions   Opana [Oxymorphone Hcl] Itching   Oxymorphone Itching   Tramadol Itching    ROS: A comprehensive review of systems was negative.  HOME  MEDS: Current Outpatient Medications  Medication Sig Dispense Refill   albuterol (VENTOLIN HFA) 108 (90 Base) MCG/ACT inhaler Inhale 2 puffs into the lungs every 6 (six) hours as needed for wheezing or shortness of breath. 18 g 11   aspirin EC 81 MG tablet Take 81 mg by mouth daily. Swallow whole.     budesonide-formoterol (SYMBICORT) 160-4.5 MCG/ACT inhaler Inhale 2 puffs into the lungs 2 (two) times daily. 1 each 6   calcitRIOL (ROCALTROL) 0.25 MCG capsule Take 0.25 mcg by mouth daily.     glucose blood test strip 1 each by Other route 2 (two) times daily. Use as instructed to check blood glucose twice daily 200 each 2   hydrocortisone (ANUSOL-HC) 2.5 % rectal cream Place 1 application rectally 2 (two) times daily. 30 g 1   Insulin Pen Needle (DROPLET PEN NEEDLES) 31G X 5 MM MISC Use to inject insulin daily as directed 150 each 2   LANTUS SOLOSTAR 100 UNIT/ML Solostar Pen INJECT  30 UNITS SUBCUTANEOUSLY AT BEDTIME (Patient taking differently: 30 Units at bedtime.) 30 mL 1   pantoprazole (PROTONIX) 40 MG tablet Take 1 tablet (40 mg total) by mouth 2 (two) times daily before a meal. 60 tablet 5   simvastatin (ZOCOR) 20 MG tablet Take 20 mg by mouth every morning.     ticagrelor (BRILINTA) 90 MG TABS tablet Take 1 tablet (90 mg total) by mouth 2 (two) times daily. 60 tablet 3   torsemide (DEMADEX) 100 MG tablet Take 100 mg by mouth daily.     TRADJENTA 5 MG TABS tablet TAKE 1 TABLET EVERY DAY 90 tablet 1   Vitamin D, Ergocalciferol, (DRISDOL) 1.25 MG (50000  UNIT) CAPS capsule Take 50,000 Units by mouth every 7 (seven) days.     losartan (COZAAR) 25 MG tablet Take 1 tablet (25 mg total) by mouth daily. 30 tablet 6   No current facility-administered medications for this visit.    LABS/IMAGING: No results found for this or any previous visit (from the past 48 hour(s)). No results found.  VITALS: BP (!) 155/88   Pulse 71   Ht '5\' 4"'$  (1.626 m)   Wt 236 lb 12.8 oz (107.4 kg)   SpO2 100%   BMI  40.65 kg/m   EXAM: General appearance: alert and no distress Neck: no carotid bruit and no JVD Lungs: bilateral end-expiratory wheezes Heart: regular rate and rhythm, S1, S2 normal, no murmur, click, rub or gallop, mild tenderness to palpation over the anterior chest wall Abdomen: soft, non-tender; bowel sounds normal; no masses,  no organomegaly and morbidly obse Extremities: extremities normal, atraumatic, no cyanosis or edema Pulses: 2+ and symmetric Skin: Skin color, texture, turgor normal. No rashes or lesions Neurologic: Grossly normal Psyvh: Normal  EKG: N/A  ASSESSMENT:  Mild coronary artery disease by cath in September 2014, question plaque rupture-on Plavix (no stent) Insulin-dependent diabetes Dyslipidemia-controlled Hypertension Morbid obesity Erectile dysfunction - s/p implant OSA on CPAP ESRD - s/p LUE AV fistula, on PD QHS  PLAN: 1.   Mr. Laminack had recent NSTEMI in January and was referred for cardiac catheterization.  He was found to have significant disease of the RCA.  He underwent PCI with a drug-eluting stent and has done well since then.  He is currently on dual antiplatelet therapy with aspirin and Brilinta.  No bleeding issues.  Cholesterol is well controlled.  His diabetes is reasonably controlled.  He has had some labile blood pressures.  He still undergoing dialysis.  We will continue with his current medications.  Plan follow-up with me in January 2024 which time we will likely discontinue his Brilinta  Pixie Casino, MD, [facility], [place] Director of the [facility] Lip[facility] &  Cardiovascular Risk Reduction Clinic Diplomate of the American Board of Clinical Lipidology Attending Cardiologist  Direct Dial: [phone]  Fax: [phone]  Website:  [email or web address] 12/01/2021, 1:23 PM

## 2021-12-02 ENCOUNTER — Encounter (HOSPITAL_COMMUNITY)
Admission: RE | Admit: 2021-12-02 | Discharge: 2021-12-02 | Disposition: A | Discharge status: Home / Self Care | Payer: Medicare Other | Source: Ambulatory Visit | Attending: Internal Medicine | Admitting: Internal Medicine

## 2021-12-02 DIAGNOSIS — Z955 Presence of coronary angioplasty implant and graft: Secondary | ICD-10-CM

## 2021-12-02 DIAGNOSIS — Z9861 Coronary angioplasty status: Secondary | ICD-10-CM

## 2021-12-02 DIAGNOSIS — N250 Renal osteodystrophy: Secondary | ICD-10-CM | POA: Diagnosis not present

## 2021-12-02 DIAGNOSIS — D509 Iron deficiency anemia, unspecified: Secondary | ICD-10-CM | POA: Diagnosis not present

## 2021-12-02 DIAGNOSIS — I214 Non-ST elevation (NSTEMI) myocardial infarction: Secondary | ICD-10-CM | POA: Diagnosis not present

## 2021-12-02 DIAGNOSIS — Z992 Dependence on renal dialysis: Secondary | ICD-10-CM | POA: Diagnosis not present

## 2021-12-02 DIAGNOSIS — N186 End stage renal disease: Secondary | ICD-10-CM | POA: Diagnosis not present

## 2021-12-02 DIAGNOSIS — D631 Anemia in chronic kidney disease: Secondary | ICD-10-CM | POA: Diagnosis not present

## 2021-12-02 NOTE — Progress Notes (Signed)
Daily Session Note  Patient Details  Name: [name] [name] MRN: [id number] Date of Birth: [birth year] Referring Provider:   Flowsheet Row CARDIAC REHAB PHASE II ORIENTATION from 09/29/2021 in [place]  Referring Provider Dr. Kurtis Bushman       Encounter Date: 12/02/2021  Check In:  Session Check In - 12/02/21 1100       Check-In   Supervising physician immediately available to respond to emergencies [facility] MD immediately available    Physician(s) Dr Vanita Panda    Location [facility]    Staff Present Rebekah Sprinkle Hassell Done, RN, BSN;Heather Otho Ket, BS, Exercise Physiologist;Phyllis Billingsley, RN    Virtual Visit No    Medication changes reported     No    Fall or balance concerns reported    No    Tobacco Cessation No Change    Warm-up and Cool-down Performed as group-led instruction    Resistance Training Performed Yes    VAD Patient? No    PAD/SET Patient? No      Pain Assessment   Currently in Pain? No/denies    Multiple Pain Sites No             Capillary Blood Glucose: No results found for this or any previous visit (from the past 24 hour(s)).    Social History   Tobacco Use  Smoking Status Former   Packs/day: 1.00   Years: 25.00   Pack years: 25.00   Types: Cigarettes   Quit date: 03/27/2010   Years since quitting: 11.6  Smokeless Tobacco Never  Tobacco Comments   Quit x 7 years    Goals Met:  Independence with exercise equipment Exercise tolerated well No report of concerns or symptoms today Strength training completed today  Goals Unmet:  Not Applicable  Comments: check out at 1200.   Dr. Carlyle Dolly is Medical Director for [facility]

## 2021-12-03 DIAGNOSIS — N250 Renal osteodystrophy: Secondary | ICD-10-CM | POA: Diagnosis not present

## 2021-12-03 DIAGNOSIS — Z992 Dependence on renal dialysis: Secondary | ICD-10-CM | POA: Diagnosis not present

## 2021-12-03 DIAGNOSIS — D509 Iron deficiency anemia, unspecified: Secondary | ICD-10-CM | POA: Diagnosis not present

## 2021-12-03 DIAGNOSIS — N186 End stage renal disease: Secondary | ICD-10-CM | POA: Diagnosis not present

## 2021-12-03 DIAGNOSIS — D631 Anemia in chronic kidney disease: Secondary | ICD-10-CM | POA: Diagnosis not present

## 2021-12-04 DIAGNOSIS — N186 End stage renal disease: Secondary | ICD-10-CM | POA: Diagnosis not present

## 2021-12-04 DIAGNOSIS — D631 Anemia in chronic kidney disease: Secondary | ICD-10-CM | POA: Diagnosis not present

## 2021-12-04 DIAGNOSIS — D509 Iron deficiency anemia, unspecified: Secondary | ICD-10-CM | POA: Diagnosis not present

## 2021-12-04 DIAGNOSIS — N250 Renal osteodystrophy: Secondary | ICD-10-CM | POA: Diagnosis not present

## 2021-12-04 DIAGNOSIS — Z992 Dependence on renal dialysis: Secondary | ICD-10-CM | POA: Diagnosis not present

## 2021-12-05 ENCOUNTER — Telehealth: Payer: Self-pay | Admitting: Internal Medicine

## 2021-12-05 ENCOUNTER — Encounter (HOSPITAL_COMMUNITY)
Admission: RE | Admit: 2021-12-05 | Discharge: 2021-12-05 | Disposition: A | Discharge status: Home / Self Care | Payer: Medicare Other | Source: Ambulatory Visit | Attending: Internal Medicine | Admitting: Internal Medicine

## 2021-12-05 VITALS — Wt 239.6 lb

## 2021-12-05 DIAGNOSIS — Z9861 Coronary angioplasty status: Secondary | ICD-10-CM | POA: Diagnosis not present

## 2021-12-05 DIAGNOSIS — Z992 Dependence on renal dialysis: Secondary | ICD-10-CM | POA: Diagnosis not present

## 2021-12-05 DIAGNOSIS — I214 Non-ST elevation (NSTEMI) myocardial infarction: Secondary | ICD-10-CM | POA: Diagnosis not present

## 2021-12-05 DIAGNOSIS — Z955 Presence of coronary angioplasty implant and graft: Secondary | ICD-10-CM | POA: Diagnosis not present

## 2021-12-05 DIAGNOSIS — L02821 Furuncle of head [any part, except face]: Secondary | ICD-10-CM | POA: Diagnosis not present

## 2021-12-05 DIAGNOSIS — N186 End stage renal disease: Secondary | ICD-10-CM | POA: Diagnosis not present

## 2021-12-05 DIAGNOSIS — D631 Anemia in chronic kidney disease: Secondary | ICD-10-CM | POA: Diagnosis not present

## 2021-12-05 DIAGNOSIS — N250 Renal osteodystrophy: Secondary | ICD-10-CM | POA: Diagnosis not present

## 2021-12-05 DIAGNOSIS — D509 Iron deficiency anemia, unspecified: Secondary | ICD-10-CM | POA: Diagnosis not present

## 2021-12-05 DIAGNOSIS — B9689 Other specified bacterial agents as the cause of diseases classified elsewhere: Secondary | ICD-10-CM | POA: Diagnosis not present

## 2021-12-05 NOTE — Telephone Encounter (Signed)
Pt's wife calling to f/u on refill request from this morning. Wife states that pharmacy still has not received refill and pt has been without medication for 2 days. Please advise

## 2021-12-05 NOTE — Telephone Encounter (Signed)
*  STAT* If patient is at the pharmacy, call can be transferred to refill team.   1. Which medications need to be refilled? (please list name of each medication and dose if known)  ticagrelor (BRILINTA) 90 MG TABS tablet  2. Which pharmacy/location (including street and city if local pharmacy) is medication to be sent to? [id number] - [place], [address]  3. Do they need a 30 day or 90 day supply? 30 day supply   Patient has been out of medication for two days.

## 2021-12-06 DIAGNOSIS — N250 Renal osteodystrophy: Secondary | ICD-10-CM | POA: Diagnosis not present

## 2021-12-06 DIAGNOSIS — D509 Iron deficiency anemia, unspecified: Secondary | ICD-10-CM | POA: Diagnosis not present

## 2021-12-06 DIAGNOSIS — N186 End stage renal disease: Secondary | ICD-10-CM | POA: Diagnosis not present

## 2021-12-06 DIAGNOSIS — D631 Anemia in chronic kidney disease: Secondary | ICD-10-CM | POA: Diagnosis not present

## 2021-12-06 DIAGNOSIS — Z992 Dependence on renal dialysis: Secondary | ICD-10-CM | POA: Diagnosis not present

## 2021-12-06 MED ORDER — TICAGRELOR 90 MG PO TABS: 90.0000 mg | ORAL_TABLET | Freq: Two times a day (BID) | ORAL | 3 refills | Status: DC

## 2021-12-07 ENCOUNTER — Encounter (HOSPITAL_COMMUNITY)
Admission: RE | Admit: 2021-12-07 | Discharge: 2021-12-07 | Disposition: A | Discharge status: Home / Self Care | Payer: Medicare Other | Source: Ambulatory Visit | Attending: Internal Medicine | Admitting: Internal Medicine

## 2021-12-07 DIAGNOSIS — I214 Non-ST elevation (NSTEMI) myocardial infarction: Secondary | ICD-10-CM | POA: Diagnosis not present

## 2021-12-07 DIAGNOSIS — N250 Renal osteodystrophy: Secondary | ICD-10-CM | POA: Diagnosis not present

## 2021-12-07 DIAGNOSIS — D509 Iron deficiency anemia, unspecified: Secondary | ICD-10-CM | POA: Diagnosis not present

## 2021-12-07 DIAGNOSIS — Z9861 Coronary angioplasty status: Secondary | ICD-10-CM | POA: Diagnosis not present

## 2021-12-07 DIAGNOSIS — Z955 Presence of coronary angioplasty implant and graft: Secondary | ICD-10-CM | POA: Diagnosis not present

## 2021-12-07 DIAGNOSIS — Z992 Dependence on renal dialysis: Secondary | ICD-10-CM | POA: Diagnosis not present

## 2021-12-07 DIAGNOSIS — D631 Anemia in chronic kidney disease: Secondary | ICD-10-CM | POA: Diagnosis not present

## 2021-12-07 DIAGNOSIS — N186 End stage renal disease: Secondary | ICD-10-CM | POA: Diagnosis not present

## 2021-12-07 NOTE — Progress Notes (Signed)
Cardiac Individual Treatment Plan  Patient Details  Name: [name] [name] MRN: [id number] Date of Birth: July 25, 1950 Referring Provider:   Flowsheet Row CARDIAC REHAB PHASE II ORIENTATION from 09/29/2021 in [place]  Referring Provider Dr. Kurtis Bushman       Initial Encounter Date:  Flowsheet Row CARDIAC REHAB PHASE II ORIENTATION from 09/29/2021 in [facility]  Date 09/29/21       Visit Diagnosis: S/P coronary artery stent placement  NSTEMI (non-ST elevated myocardial infarction) ([place])  S/P PTCA (percutaneous transluminal coronary angioplasty)  Patient's Home Medications on Admission:  Current Outpatient Medications:    albuterol (VENTOLIN HFA) 108 (90 Base) MCG/ACT inhaler, Inhale 2 puffs into the lungs every 6 (six) hours as needed for wheezing or shortness of breath., Disp: 18 g, Rfl: 11   aspirin EC 81 MG tablet, Take 81 mg by mouth daily. Swallow whole., Disp: , Rfl:    budesonide-formoterol (SYMBICORT) 160-4.5 MCG/ACT inhaler, Inhale 2 puffs into the lungs 2 (two) times daily., Disp: 1 each, Rfl: 6   calcitRIOL (ROCALTROL) 0.25 MCG capsule, Take 0.25 mcg by mouth daily., Disp: , Rfl:    glucose blood test strip, 1 each by Other route 2 (two) times daily. Use as instructed to check blood glucose twice daily, Disp: 200 each, Rfl: 2   hydrocortisone (ANUSOL-HC) 2.5 % rectal cream, Place 1 application rectally 2 (two) times daily., Disp: 30 g, Rfl: 1   Insulin Pen Needle (DROPLET PEN NEEDLES) 31G X 5 MM MISC, Use to inject insulin daily as directed, Disp: 150 each, Rfl: 2   LANTUS SOLOSTAR 100 UNIT/ML Solostar Pen, INJECT  30 UNITS SUBCUTANEOUSLY AT BEDTIME (Patient taking differently: 30 Units at bedtime.), Disp: 30 mL, Rfl: 1   losartan (COZAAR) 25 MG tablet, Take 1 tablet (25 mg total) by mouth daily., Disp: 30 tablet, Rfl: 6   pantoprazole (PROTONIX) 40 MG tablet, Take 1 tablet (40 mg total) by mouth 2 (two) times daily before a meal.,  Disp: 60 tablet, Rfl: 5   simvastatin (ZOCOR) 20 MG tablet, Take 20 mg by mouth every morning., Disp: , Rfl:    ticagrelor (BRILINTA) 90 MG TABS tablet, Take 1 tablet (90 mg total) by mouth 2 (two) times daily., Disp: 60 tablet, Rfl: 3   torsemide (DEMADEX) 100 MG tablet, Take 100 mg by mouth daily., Disp: , Rfl:    TRADJENTA 5 MG TABS tablet, TAKE 1 TABLET EVERY DAY, Disp: 90 tablet, Rfl: 1   Vitamin D, Ergocalciferol, (DRISDOL) 1.25 MG (50000 UNIT) CAPS capsule, Take 50,000 Units by mouth every 7 (seven) days., Disp: , Rfl:   Past Medical History: Past Medical History:  Diagnosis Date   Anemia    Arthritis    Asthma    Bell palsy    CAD (coronary artery disease)    a. 2014 MV: abnl w/ infap ischemia; b. 03/2013 Cath: aneurysmal bleb in the LAD w/ otw nonobs dzs-->Med Rx.   Chronic back pain    Chronic knee pain    a. 09/2015 s/p R TKA.   Chronic pain    Chronic shoulder pain    Chronic sinusitis    CKD (chronic kidney disease), stage IV (HCC)    stage 4 per office visit note of Dr Lowanda Foster on 05/2015    COPD (chronic obstructive pulmonary disease) ([place])    Diabetes mellitus without complication ([place])    type II    Essential hypertension    GERD (gastroesophageal reflux disease)  Gout    Gout    Hepatomegaly    noted on noncontrast CT 2015   History of hiatal hernia    Hyperlipidemia    Lateral meniscus tear    Obesity    Truncal   Obstructive sleep apnea    does not use cpap    On home oxygen therapy    uses 2l when is going somewhere per patient    PUD (peptic ulcer disease)    remote, reports f/u EGD about 8 years ago unremarkable    Reactive airway disease    related to exposure to chemical during 9/11   Renal insufficiency    Sinusitis    Vitamin D deficiency     Tobacco Use: Social History   Tobacco Use  Smoking Status Former   Packs/day: 1.00   Years: 25.00   Pack years: 25.00   Types: Cigarettes   Quit date: 03/27/2010   Years since quitting: 11.7   Smokeless Tobacco Never  Tobacco Comments   Quit x 7 years    Labs: Review Flowsheet        Latest Ref Rng & Units 12/15/2020 06/10/2021 06/16/2021 07/22/2021  Labs for ITP Cardiac and Pulmonary Rehab  Cholestrol <200 mg/dL  104      LDL (calc) mg/dL (calc)  53      HDL-C > OR = 40 mg/dL  30      Trlycerides <150 mg/dL  110      Hemoglobin A1c 4.8 - 5.6 % 6.2    6.2   6.6       11/23/2021  Labs for ITP Cardiac and Pulmonary Rehab  Cholestrol 99    LDL (calc) 47    HDL-C 29    Trlycerides 148    Hemoglobin A1c           Capillary Blood Glucose: Lab Results  Component Value Date   GLUCAP 118 (H) 07/27/2021   GLUCAP 161 (H) 07/26/2021   GLUCAP 107 (H) 07/26/2021   GLUCAP 126 (H) 07/26/2021   GLUCAP 104 (H) 07/26/2021     Exercise Target Goals: Exercise Program Goal: Individual exercise prescription set using results from initial 6 min walk test and THRR while considering  patient's activity barriers and safety.   Exercise Prescription Goal: Starting with aerobic activity 30 plus minutes a day, 3 days per week for initial exercise prescription. Provide home exercise prescription and guidelines that participant acknowledges understanding prior to discharge.  Activity Barriers & Risk Stratification:  Activity Barriers & Cardiac Risk Stratification - 09/29/21 1242       Activity Barriers & Cardiac Risk Stratification   Activity Barriers Arthritis;Neck/Spine Problems;Back Problems;Shortness of Breath    Cardiac Risk Stratification High             6 Minute Walk:  6 Minute Walk     Row Name 09/29/21 1339         6 Minute Walk   Phase Initial     Distance 1150 feet     Walk Time 6 minutes     # of Rest Breaks 0     MPH 2.17     METS 1.97     RPE 12     VO2 Peak 6.92     Symptoms No     Resting HR 65 bpm     Resting BP 118/72     Resting Oxygen Saturation  98 %     Exercise Oxygen Saturation  during 6 min walk  94 %     Max Ex. HR 108 bpm     Max Ex.  BP 142/60     2 Minute Post BP 120/60              Oxygen Initial Assessment:   Oxygen Re-Evaluation:   Oxygen Discharge (Final Oxygen Re-Evaluation):   Initial Exercise Prescription:  Initial Exercise Prescription - 09/29/21 1300       Date of Initial Exercise RX and Referring Provider   Date 09/29/21    Referring Provider Dr. Kurtis Bushman    Expected Discharge Date 12/23/21      Treadmill   MPH 1.3    Grade 0    Minutes 17      NuStep   Level 1    SPM 60    Minutes 22      Prescription Details   Frequency (times per week) 3    Duration Progress to 30 minutes of continuous aerobic without signs/symptoms of physical distress      Intensity   THRR 40-80% of Max Heartrate 60-119    Ratings of Perceived Exertion 11-13      Progression   Progression Continue progressive overload as per policy without signs/symptoms or physical distress.      Resistance Training   Training Prescription Yes    Weight 3    Reps 10-15             Perform Capillary Blood Glucose checks as needed.  Exercise Prescription Changes:   Exercise Prescription Changes     Row Name 10/07/21 1100 10/10/21 1400 10/21/21 1509 11/07/21 1200 11/21/21 1200     Response to Exercise   Blood Pressure (Admit) -- 126/70 132/76 126/60 138/70   Blood Pressure (Exercise) -- 148/74 116/60 120/60 142/78   Blood Pressure (Exit) -- 108/80 94/50 140/70 118/78   Heart Rate (Admit) -- 64 bpm 72 bpm 65 bpm 63 bpm   Heart Rate (Exercise) -- 121 bpm 108 bpm 106 bpm 95 bpm   Heart Rate (Exit) -- 73 bpm 81 bpm 74 bpm 72 bpm   Rating of Perceived Exertion (Exercise) -- '10 11 11 10   '$ Duration -- Continue with 30 min of aerobic exercise without signs/symptoms of physical distress. Continue with 30 min of aerobic exercise without signs/symptoms of physical distress. Continue with 30 min of aerobic exercise without signs/symptoms of physical distress. Continue with 30 min of aerobic exercise without signs/symptoms  of physical distress.   Intensity -- THRR unchanged THRR unchanged THRR unchanged THRR unchanged     Progression   Progression -- Continue to progress workloads to maintain intensity without signs/symptoms of physical distress. Continue to progress workloads to maintain intensity without signs/symptoms of physical distress. Continue to progress workloads to maintain intensity without signs/symptoms of physical distress. Continue to progress workloads to maintain intensity without signs/symptoms of physical distress.     Resistance Training   Training Prescription -- Yes Yes Yes Yes   Weight -- '5 5 5 5   '$ Reps -- 10-15 10-15 10-15 10-15   Time -- 10 Minutes 10 Minutes 10 Minutes 10 Minutes     Treadmill   MPH -- 1.7 1.'9 2 2   '$ Grade -- 0 0 0 0   Minutes -- '17 17 17 17   '$ METs -- 2.3 2.45 2.53 2.53     NuStep   Level -- '1 3 5 4   '$ SPM -- 89 79 72 81   Minutes -- '22 22 22 '$ 22  METs -- 2.26 2.18 2.53 2.39     Home Exercise Plan   Plans to continue exercise at Home (comment) -- -- -- --   Frequency Add 2 additional days to program exercise sessions. -- -- -- --   Initial Home Exercises Provided 10/07/21 -- -- -- --    [place] Name 12/05/21 1500             Response to Exercise   Blood Pressure (Admit) 148/90       Blood Pressure (Exercise) 142/82       Blood Pressure (Exit) 148/72       Heart Rate (Admit) 73 bpm       Heart Rate (Exercise) 109 bpm       Heart Rate (Exit) 82 bpm       Rating of Perceived Exertion (Exercise) 11       Duration Continue with 30 min of aerobic exercise without signs/symptoms of physical distress.       Intensity THRR unchanged         Progression   Progression Continue to progress workloads to maintain intensity without signs/symptoms of physical distress.         Resistance Training   Training Prescription Yes       Weight 5       Reps 10-15       Time 10 Minutes         Treadmill   MPH 1.5       Grade 6       Minutes 17       METs 3.39          NuStep   Level 5       SPM 74       Minutes 22       METs 2.42                Exercise Comments:   Exercise Comments     Row Name 10/07/21 1129           Exercise Comments home exercise reviewed                Exercise Goals and Review:   Exercise Goals     Row Name 09/29/21 1350 10/10/21 1421 11/07/21 1241 12/05/21 1539       Exercise Goals   Increase Physical Activity Yes Yes Yes Yes    Intervention Provide advice, education, support and counseling about physical activity/exercise needs.;Develop an individualized exercise prescription for aerobic and resistive training based on initial evaluation findings, risk stratification, comorbidities and participant's personal goals. Provide advice, education, support and counseling about physical activity/exercise needs.;Develop an individualized exercise prescription for aerobic and resistive training based on initial evaluation findings, risk stratification, comorbidities and participant's personal goals. Provide advice, education, support and counseling about physical activity/exercise needs.;Develop an individualized exercise prescription for aerobic and resistive training based on initial evaluation findings, risk stratification, comorbidities and participant's personal goals. Provide advice, education, support and counseling about physical activity/exercise needs.;Develop an individualized exercise prescription for aerobic and resistive training based on initial evaluation findings, risk stratification, comorbidities and participant's personal goals.    Expected Outcomes Short Term: Attend rehab on a regular basis to increase amount of physical activity.;Long Term: Add in home exercise to make exercise part of routine and to increase amount of physical activity.;Long Term: Exercising regularly at least 3-5 days a week. Short Term: Attend rehab on a regular basis to increase amount of physical activity.;Long Term: Add  in  home exercise to make exercise part of routine and to increase amount of physical activity.;Long Term: Exercising regularly at least 3-5 days a week. Short Term: Attend rehab on a regular basis to increase amount of physical activity.;Long Term: Add in home exercise to make exercise part of routine and to increase amount of physical activity.;Long Term: Exercising regularly at least 3-5 days a week. Short Term: Attend rehab on a regular basis to increase amount of physical activity.;Long Term: Add in home exercise to make exercise part of routine and to increase amount of physical activity.;Long Term: Exercising regularly at least 3-5 days a week.    Increase Strength and Stamina Yes Yes Yes Yes    Intervention Provide advice, education, support and counseling about physical activity/exercise needs.;Develop an individualized exercise prescription for aerobic and resistive training based on initial evaluation findings, risk stratification, comorbidities and participant's personal goals. Provide advice, education, support and counseling about physical activity/exercise needs.;Develop an individualized exercise prescription for aerobic and resistive training based on initial evaluation findings, risk stratification, comorbidities and participant's personal goals. Provide advice, education, support and counseling about physical activity/exercise needs.;Develop an individualized exercise prescription for aerobic and resistive training based on initial evaluation findings, risk stratification, comorbidities and participant's personal goals. Provide advice, education, support and counseling about physical activity/exercise needs.;Develop an individualized exercise prescription for aerobic and resistive training based on initial evaluation findings, risk stratification, comorbidities and participant's personal goals.    Expected Outcomes Short Term: Increase workloads from initial exercise prescription for resistance,  speed, and METs.;Short Term: Perform resistance training exercises routinely during rehab and add in resistance training at home;Long Term: Improve cardiorespiratory fitness, muscular endurance and strength as measured by increased METs and functional capacity (6MWT) Short Term: Increase workloads from initial exercise prescription for resistance, speed, and METs.;Short Term: Perform resistance training exercises routinely during rehab and add in resistance training at home;Long Term: Improve cardiorespiratory fitness, muscular endurance and strength as measured by increased METs and functional capacity (6MWT) Short Term: Increase workloads from initial exercise prescription for resistance, speed, and METs.;Short Term: Perform resistance training exercises routinely during rehab and add in resistance training at home;Long Term: Improve cardiorespiratory fitness, muscular endurance and strength as measured by increased METs and functional capacity (6MWT) Short Term: Increase workloads from initial exercise prescription for resistance, speed, and METs.;Short Term: Perform resistance training exercises routinely during rehab and add in resistance training at home;Long Term: Improve cardiorespiratory fitness, muscular endurance and strength as measured by increased METs and functional capacity (6MWT)    Able to understand and use rate of perceived exertion (RPE) scale Yes Yes Yes Yes    Intervention Provide education and explanation on how to use RPE scale Provide education and explanation on how to use RPE scale Provide education and explanation on how to use RPE scale Provide education and explanation on how to use RPE scale    Expected Outcomes Short Term: Able to use RPE daily in rehab to express subjective intensity level;Long Term:  Able to use RPE to guide intensity level when exercising independently Short Term: Able to use RPE daily in rehab to express subjective intensity level;Long Term:  Able to use RPE  to guide intensity level when exercising independently Short Term: Able to use RPE daily in rehab to express subjective intensity level;Long Term:  Able to use RPE to guide intensity level when exercising independently Short Term: Able to use RPE daily in rehab to express subjective intensity level;Long Term:  Able to  use RPE to guide intensity level when exercising independently    Knowledge and understanding of Target Heart Rate Range (THRR) Yes Yes Yes Yes    Intervention Provide education and explanation of THRR including how the numbers were predicted and where they are located for reference Provide education and explanation of THRR including how the numbers were predicted and where they are located for reference Provide education and explanation of THRR including how the numbers were predicted and where they are located for reference Provide education and explanation of THRR including how the numbers were predicted and where they are located for reference    Expected Outcomes Short Term: Able to state/look up THRR;Long Term: Able to use THRR to govern intensity when exercising independently;Short Term: Able to use daily as guideline for intensity in rehab Short Term: Able to state/look up THRR;Long Term: Able to use THRR to govern intensity when exercising independently;Short Term: Able to use daily as guideline for intensity in rehab Short Term: Able to state/look up THRR;Long Term: Able to use THRR to govern intensity when exercising independently;Short Term: Able to use daily as guideline for intensity in rehab Short Term: Able to state/look up THRR;Long Term: Able to use THRR to govern intensity when exercising independently;Short Term: Able to use daily as guideline for intensity in rehab    Able to check pulse independently Yes Yes Yes Yes    Intervention Provide education and demonstration on how to check pulse in carotid and radial arteries.;Review the importance of being able to check your own  pulse for safety during independent exercise Provide education and demonstration on how to check pulse in carotid and radial arteries.;Review the importance of being able to check your own pulse for safety during independent exercise Provide education and demonstration on how to check pulse in carotid and radial arteries.;Review the importance of being able to check your own pulse for safety during independent exercise Provide education and demonstration on how to check pulse in carotid and radial arteries.;Review the importance of being able to check your own pulse for safety during independent exercise    Expected Outcomes Short Term: Able to explain why pulse checking is important during independent exercise;Long Term: Able to check pulse independently and accurately Short Term: Able to explain why pulse checking is important during independent exercise;Long Term: Able to check pulse independently and accurately Short Term: Able to explain why pulse checking is important during independent exercise;Long Term: Able to check pulse independently and accurately Short Term: Able to explain why pulse checking is important during independent exercise;Long Term: Able to check pulse independently and accurately    Understanding of Exercise Prescription Yes Yes Yes Yes    Intervention Provide education, explanation, and written materials on patient's individual exercise prescription Provide education, explanation, and written materials on patient's individual exercise prescription Provide education, explanation, and written materials on patient's individual exercise prescription Provide education, explanation, and written materials on patient's individual exercise prescription    Expected Outcomes Short Term: Able to explain program exercise prescription;Long Term: Able to explain home exercise prescription to exercise independently Short Term: Able to explain program exercise prescription;Long Term: Able to explain  home exercise prescription to exercise independently Short Term: Able to explain program exercise prescription;Long Term: Able to explain home exercise prescription to exercise independently Short Term: Able to explain program exercise prescription;Long Term: Able to explain home exercise prescription to exercise independently             Exercise Goals Re-Evaluation :  Exercise Goals Re-Evaluation     [place] Name 10/10/21 1421 11/07/21 1242 12/05/21 1543         Exercise Goal Re-Evaluation   Exercise Goals Review Increase Physical Activity;Increase Strength and Stamina;Able to understand and use rate of perceived exertion (RPE) scale;Knowledge and understanding of Target Heart Rate Range (THRR);Able to check pulse independently;Understanding of Exercise Prescription Increase Physical Activity;Increase Strength and Stamina;Able to understand and use rate of perceived exertion (RPE) scale;Knowledge and understanding of Target Heart Rate Range (THRR);Able to check pulse independently;Understanding of Exercise Prescription Increase Physical Activity;Increase Strength and Stamina;Able to understand and use rate of perceived exertion (RPE) scale;Knowledge and understanding of Target Heart Rate Range (THRR);Able to check pulse independently;Understanding of Exercise Prescription     Comments Pt has completed 5 sessions of cardiac rehab. He is progressing while in class and increasing his workload. He is motiviated while in class. He is currently exercising at 2.30 METs on the treadmill. Will continue to monitor and progress as able. Pt has completed 14 sessions of cardiac rehab. He continues to be motivated while in class and is progressing his workload. He is curretnly exercising at 2.53 METs on the treadmill. Will continue to monitor and progress as able. Pt has completed 24 sessions of cardiac rehab. He continues to be motivated while in class and continues to progress his workload. He is having high BP  reading at rest and during exercise. He is currently exercising at 3.39 METs on the treadmill. Will continue to monitor and progress as able.     Expected Outcomes Through exercise at home and at rehab, the patient will meet their stated goals. Through exercise at home and at rehab, the patient will meet their stated goals. Through exercise at home and at rehab, the patient will meet their stated goals.               Discharge Exercise Prescription (Final Exercise Prescription Changes):  Exercise Prescription Changes - 12/05/21 1500       Response to Exercise   Blood Pressure (Admit) 148/90    Blood Pressure (Exercise) 142/82    Blood Pressure (Exit) 148/72    Heart Rate (Admit) 73 bpm    Heart Rate (Exercise) 109 bpm    Heart Rate (Exit) 82 bpm    Rating of Perceived Exertion (Exercise) 11    Duration Continue with 30 min of aerobic exercise without signs/symptoms of physical distress.    Intensity THRR unchanged      Progression   Progression Continue to progress workloads to maintain intensity without signs/symptoms of physical distress.      Resistance Training   Training Prescription Yes    Weight 5    Reps 10-15    Time 10 Minutes      Treadmill   MPH 1.5    Grade 6    Minutes 17    METs 3.39      NuStep   Level 5    SPM 74    Minutes 22    METs 2.42             Nutrition:  Target Goals: Understanding of nutrition guidelines, daily intake of sodium '1500mg'$ , cholesterol '200mg'$ , calories 30% from fat and 7% or less from saturated fats, daily to have 5 or more servings of fruits and vegetables.  Biometrics:  Pre Biometrics - 09/29/21 1352       Pre Biometrics   Height '5\' 4"'$  (1.626 m)    Weight 108.4 kg  Waist Circumference 51 inches    Hip Circumference 44.5 inches    Waist to Hip Ratio 1.15 %    BMI (Calculated) 41    Triceps Skinfold 16 mm    % Body Fat 38.9 %    Grip Strength 37.4 kg    Flexibility 7 in    Single Leg Stand 10 seconds               Nutrition Therapy Plan and Nutrition Goals:  Nutrition Therapy & Goals - 09/29/21 1412       Personal Nutrition Goals   Comments Patient scored 48 on his diet assessment. Handout provided and explained regarding DM and healthier choices. Patient verbalized understanding. He says he follows a low sugar, NA, and renal diet. We offer 2 educational sessions on heart healthy nutrition with handouts and assistance with RD referral if patient is interested.      Intervention Plan   Intervention Nutrition handout(s) given to patient.    Expected Outcomes Short Term Goal: Understand basic principles of dietary content, such as calories, fat, sodium, cholesterol and nutrients.             Nutrition Assessments:  Nutrition Assessments - 09/29/21 1413       MEDFICTS Scores   Pre Score 48            MEDIFICTS Score Key: ?70 Need to make dietary changes  40-70 Heart Healthy Diet ? 40 Therapeutic Level Cholesterol Diet   Picture Your Plate Scores: <73 Unhealthy dietary pattern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

## 2021-12-08 DIAGNOSIS — D509 Iron deficiency anemia, unspecified: Secondary | ICD-10-CM | POA: Diagnosis not present

## 2021-12-08 DIAGNOSIS — D631 Anemia in chronic kidney disease: Secondary | ICD-10-CM | POA: Diagnosis not present

## 2021-12-08 DIAGNOSIS — N186 End stage renal disease: Secondary | ICD-10-CM | POA: Diagnosis not present

## 2021-12-08 DIAGNOSIS — Z992 Dependence on renal dialysis: Secondary | ICD-10-CM | POA: Diagnosis not present

## 2021-12-08 DIAGNOSIS — N250 Renal osteodystrophy: Secondary | ICD-10-CM | POA: Diagnosis not present

## 2021-12-09 ENCOUNTER — Encounter (HOSPITAL_COMMUNITY)
Admission: RE | Admit: 2021-12-09 | Discharge: 2021-12-09 | Disposition: A | Discharge status: Home / Self Care | Payer: Medicare Other | Source: Ambulatory Visit | Attending: Internal Medicine | Admitting: Internal Medicine

## 2021-12-09 DIAGNOSIS — Z9861 Coronary angioplasty status: Secondary | ICD-10-CM | POA: Diagnosis not present

## 2021-12-09 DIAGNOSIS — Z955 Presence of coronary angioplasty implant and graft: Secondary | ICD-10-CM

## 2021-12-09 DIAGNOSIS — I214 Non-ST elevation (NSTEMI) myocardial infarction: Secondary | ICD-10-CM | POA: Diagnosis not present

## 2021-12-09 DIAGNOSIS — D631 Anemia in chronic kidney disease: Secondary | ICD-10-CM | POA: Diagnosis not present

## 2021-12-09 DIAGNOSIS — N250 Renal osteodystrophy: Secondary | ICD-10-CM | POA: Diagnosis not present

## 2021-12-09 DIAGNOSIS — N186 End stage renal disease: Secondary | ICD-10-CM | POA: Diagnosis not present

## 2021-12-09 DIAGNOSIS — Z992 Dependence on renal dialysis: Secondary | ICD-10-CM | POA: Diagnosis not present

## 2021-12-09 DIAGNOSIS — D509 Iron deficiency anemia, unspecified: Secondary | ICD-10-CM | POA: Diagnosis not present

## 2021-12-09 NOTE — Progress Notes (Signed)
Daily Session Note  Patient Details  Name: [name] [name] MRN: [id number] Date of Birth: Aug 22, 1950 Referring Provider:   Flowsheet Row CARDIAC REHAB PHASE II ORIENTATION from 09/29/2021 in [place]  Referring Provider Dr. Kurtis Bushman       Encounter Date: 12/09/2021  Check In:  Session Check In - 12/09/21 1100       Check-In   Supervising physician immediately available to respond to emergencies [facility] MD immediately available    Physician(s) Dr Debara Pickett    Location [facility]    Staff Present [name], BS, Exercise Physiologist;Christy Oletta Lamas, RN, Joanette Gula, RN, BSN    Virtual Visit No    Medication changes reported     No    Fall or balance concerns reported    No    Tobacco Cessation No Change    Warm-up and Cool-down Performed as group-led instruction    Resistance Training Performed Yes    VAD Patient? No    PAD/SET Patient? No      Pain Assessment   Currently in Pain? No/denies    Pain Score 0-No pain    Multiple Pain Sites No             Capillary Blood Glucose: No results found for this or any previous visit (from the past 24 hour(s)).    Social History   Tobacco Use  Smoking Status Former   Packs/day: 1.00   Years: 25.00   Pack years: 25.00   Types: Cigarettes   Quit date: 03/27/2010   Years since quitting: 11.7  Smokeless Tobacco Never  Tobacco Comments   Quit x 7 years    Goals Met:  Independence with exercise equipment Exercise tolerated well No report of concerns or symptoms today Strength training completed today  Goals Unmet:  Not Applicable  Comments: checkout at 1200.   Dr. Carlyle Dolly is Medical Director for [facility]

## 2021-12-10 DIAGNOSIS — D631 Anemia in chronic kidney disease: Secondary | ICD-10-CM | POA: Diagnosis not present

## 2021-12-10 DIAGNOSIS — D509 Iron deficiency anemia, unspecified: Secondary | ICD-10-CM | POA: Diagnosis not present

## 2021-12-10 DIAGNOSIS — Z992 Dependence on renal dialysis: Secondary | ICD-10-CM | POA: Diagnosis not present

## 2021-12-10 DIAGNOSIS — N250 Renal osteodystrophy: Secondary | ICD-10-CM | POA: Diagnosis not present

## 2021-12-10 DIAGNOSIS — N186 End stage renal disease: Secondary | ICD-10-CM | POA: Diagnosis not present

## 2021-12-11 DIAGNOSIS — N186 End stage renal disease: Secondary | ICD-10-CM | POA: Diagnosis not present

## 2021-12-11 DIAGNOSIS — D631 Anemia in chronic kidney disease: Secondary | ICD-10-CM | POA: Diagnosis not present

## 2021-12-11 DIAGNOSIS — N250 Renal osteodystrophy: Secondary | ICD-10-CM | POA: Diagnosis not present

## 2021-12-11 DIAGNOSIS — D509 Iron deficiency anemia, unspecified: Secondary | ICD-10-CM | POA: Diagnosis not present

## 2021-12-11 DIAGNOSIS — Z992 Dependence on renal dialysis: Secondary | ICD-10-CM | POA: Diagnosis not present

## 2021-12-12 ENCOUNTER — Encounter (HOSPITAL_COMMUNITY): Payer: Medicare Other

## 2021-12-12 DIAGNOSIS — D509 Iron deficiency anemia, unspecified: Secondary | ICD-10-CM | POA: Diagnosis not present

## 2021-12-12 DIAGNOSIS — D631 Anemia in chronic kidney disease: Secondary | ICD-10-CM | POA: Diagnosis not present

## 2021-12-12 DIAGNOSIS — N186 End stage renal disease: Secondary | ICD-10-CM | POA: Diagnosis not present

## 2021-12-12 DIAGNOSIS — N250 Renal osteodystrophy: Secondary | ICD-10-CM | POA: Diagnosis not present

## 2021-12-12 DIAGNOSIS — Z992 Dependence on renal dialysis: Secondary | ICD-10-CM | POA: Diagnosis not present

## 2021-12-13 ENCOUNTER — Encounter (HOSPITAL_COMMUNITY): Payer: Self-pay

## 2021-12-13 ENCOUNTER — Emergency Department (HOSPITAL_COMMUNITY): Payer: Medicare Other

## 2021-12-13 ENCOUNTER — Other Ambulatory Visit: Payer: Self-pay

## 2021-12-13 DIAGNOSIS — J449 Chronic obstructive pulmonary disease, unspecified: Secondary | ICD-10-CM | POA: Insufficient documentation

## 2021-12-13 DIAGNOSIS — I2510 Atherosclerotic heart disease of native coronary artery without angina pectoris: Secondary | ICD-10-CM | POA: Diagnosis not present

## 2021-12-13 DIAGNOSIS — Z87891 Personal history of nicotine dependence: Secondary | ICD-10-CM | POA: Insufficient documentation

## 2021-12-13 DIAGNOSIS — D631 Anemia in chronic kidney disease: Secondary | ICD-10-CM | POA: Diagnosis not present

## 2021-12-13 DIAGNOSIS — R110 Nausea: Secondary | ICD-10-CM | POA: Insufficient documentation

## 2021-12-13 DIAGNOSIS — E1122 Type 2 diabetes mellitus with diabetic chronic kidney disease: Secondary | ICD-10-CM | POA: Diagnosis not present

## 2021-12-13 DIAGNOSIS — R079 Chest pain, unspecified: Secondary | ICD-10-CM | POA: Insufficient documentation

## 2021-12-13 DIAGNOSIS — R0789 Other chest pain: Secondary | ICD-10-CM | POA: Diagnosis not present

## 2021-12-13 DIAGNOSIS — Z9861 Coronary angioplasty status: Secondary | ICD-10-CM | POA: Insufficient documentation

## 2021-12-13 DIAGNOSIS — I129 Hypertensive chronic kidney disease with stage 1 through stage 4 chronic kidney disease, or unspecified chronic kidney disease: Secondary | ICD-10-CM | POA: Diagnosis not present

## 2021-12-13 DIAGNOSIS — D509 Iron deficiency anemia, unspecified: Secondary | ICD-10-CM | POA: Diagnosis not present

## 2021-12-13 DIAGNOSIS — J45909 Unspecified asthma, uncomplicated: Secondary | ICD-10-CM | POA: Insufficient documentation

## 2021-12-13 DIAGNOSIS — N184 Chronic kidney disease, stage 4 (severe): Secondary | ICD-10-CM | POA: Diagnosis not present

## 2021-12-13 DIAGNOSIS — Z992 Dependence on renal dialysis: Secondary | ICD-10-CM | POA: Diagnosis not present

## 2021-12-13 DIAGNOSIS — N186 End stage renal disease: Secondary | ICD-10-CM | POA: Diagnosis not present

## 2021-12-13 DIAGNOSIS — N250 Renal osteodystrophy: Secondary | ICD-10-CM | POA: Diagnosis not present

## 2021-12-13 NOTE — ED Triage Notes (Signed)
Center chest pain since 0300 today. Feels like pressure. 8/10

## 2021-12-14 ENCOUNTER — Encounter (HOSPITAL_COMMUNITY): Payer: Medicare Other

## 2021-12-14 ENCOUNTER — Emergency Department (HOSPITAL_COMMUNITY)
Admission: EM | Admit: 2021-12-14 | Discharge: 2021-12-14 | Disposition: A | Discharge status: Home / Self Care | Payer: Medicare Other | Attending: Emergency Medicine | Admitting: Emergency Medicine

## 2021-12-14 DIAGNOSIS — R079 Chest pain, unspecified: Secondary | ICD-10-CM

## 2021-12-14 DIAGNOSIS — D631 Anemia in chronic kidney disease: Secondary | ICD-10-CM | POA: Diagnosis not present

## 2021-12-14 DIAGNOSIS — D509 Iron deficiency anemia, unspecified: Secondary | ICD-10-CM | POA: Diagnosis not present

## 2021-12-14 DIAGNOSIS — N186 End stage renal disease: Secondary | ICD-10-CM | POA: Diagnosis not present

## 2021-12-14 DIAGNOSIS — N250 Renal osteodystrophy: Secondary | ICD-10-CM | POA: Diagnosis not present

## 2021-12-14 DIAGNOSIS — Z992 Dependence on renal dialysis: Secondary | ICD-10-CM | POA: Diagnosis not present

## 2021-12-14 LAB — CBC
HCT: 32.8 % — ABNORMAL LOW (ref 39.0–52.0)
Hemoglobin: 10.6 g/dL — ABNORMAL LOW (ref 13.0–17.0)
MCH: 31.1 pg (ref 26.0–34.0)
MCHC: 32.3 g/dL (ref 30.0–36.0)
MCV: 96.2 fL (ref 80.0–100.0)
Platelets: 166 10*3/uL (ref 150–400)
RBC: 3.41 MIL/uL — ABNORMAL LOW (ref 4.22–5.81)
RDW: 13.5 % (ref 11.5–15.5)
WBC: 6.6 10*3/uL (ref 4.0–10.5)
nRBC: 0.3 % — ABNORMAL HIGH (ref 0.0–0.2)

## 2021-12-14 LAB — TROPONIN I (HIGH SENSITIVITY)
Troponin I (High Sensitivity): 51 ng/L — ABNORMAL HIGH (ref <18)
Troponin I (High Sensitivity): 53 ng/L — ABNORMAL HIGH (ref <18)

## 2021-12-14 LAB — BASIC METABOLIC PANEL
Anion gap: 12 (ref 5–15)
BUN: 67 mg/dL — ABNORMAL HIGH (ref 8–23)
CO2: 25 mmol/L (ref 22–32)
Calcium: 9.3 mg/dL (ref 8.9–10.3)
Chloride: 98 mmol/L (ref 98–111)
Creatinine, Ser: 18.41 mg/dL — ABNORMAL HIGH (ref 0.61–1.24)
GFR, Estimated: 2 mL/min — ABNORMAL LOW (ref >60)
Glucose, Bld: 123 mg/dL — ABNORMAL HIGH (ref 70–99)
Potassium: 3.7 mmol/L (ref 3.5–5.1)
Sodium: 135 mmol/L (ref 135–145)

## 2021-12-14 MED ORDER — NITROGLYCERIN 0.4 MG SL SUBL
0.4000 mg | SUBLINGUAL_TABLET | Freq: Once | SUBLINGUAL | Status: AC
Start: 2021-12-14 — End: 2021-12-14
  Administered 2021-12-14: 0.4 mg via SUBLINGUAL
  Filled 2021-12-14: qty 1

## 2021-12-14 MED ORDER — ONDANSETRON 4 MG PO TBDP
4.0000 mg | ORAL_TABLET | Freq: Once | ORAL | Status: AC
Start: 2021-12-14 — End: 2021-12-14
  Administered 2021-12-14: 4 mg via ORAL
  Filled 2021-12-14: qty 1

## 2021-12-14 MED ORDER — HYDROMORPHONE HCL 1 MG/ML IJ SOLN
0.5000 mg | Freq: Once | INTRAMUSCULAR | Status: AC
  Administered 2021-12-14: 0.5 mg via INTRAVENOUS
  Filled 2021-12-14: qty 0.5

## 2021-12-14 MED ORDER — NITROGLYCERIN 0.4 MG SL SUBL: 0.4000 mg | SUBLINGUAL_TABLET | SUBLINGUAL | 1 refills | Status: DC | PRN

## 2021-12-14 MED ORDER — ASPIRIN 325 MG PO TABS
325.0000 mg | ORAL_TABLET | Freq: Every day | ORAL | Status: DC
  Administered 2021-12-14: 325 mg via ORAL
  Filled 2021-12-14: qty 1

## 2021-12-14 MED ORDER — METOPROLOL SUCCINATE ER 25 MG PO TB24
25.0000 mg | ORAL_TABLET | Freq: Once | ORAL | Status: AC
  Administered 2021-12-14: 25 mg via ORAL
  Filled 2021-12-14: qty 1

## 2021-12-14 MED ORDER — METOPROLOL SUCCINATE ER 25 MG PO TB24: 25.0000 mg | ORAL_TABLET | Freq: Every day | ORAL | 1 refills | Status: DC

## 2021-12-14 MED ORDER — OXYCODONE HCL 5 MG PO TABS
5.0000 mg | ORAL_TABLET | Freq: Once | ORAL | Status: AC
  Administered 2021-12-14: 5 mg via ORAL
  Filled 2021-12-14: qty 1

## 2021-12-14 NOTE — ED Provider Notes (Addendum)
[facility] Emergency Department Provider Note MRN:  [id number]  Arrival date & time: 12/14/21     Chief Complaint   Chest Pain   History of Present Illness   [name] [name] is a 71 y.o. year-old male with a history of CAD, MI presenting to the ED with chief complaint of chest pain.  Sudden onset chest pain 3 AM yesterday, now has been intermittent for 24 hours.  Described as a pressure, nonradiating, feels very similar to his heart attack back in January.  Associated with nausea.  Review of Systems  A thorough review of systems was obtained and all systems are negative except as noted in the HPI and PMH.   Patient's Health History    Past Medical History:  Diagnosis Date   Anemia    Arthritis    Asthma    Bell palsy    CAD (coronary artery disease)    a. 2014 MV: abnl w/ infap ischemia; b. 03/2013 Cath: aneurysmal bleb in the LAD w/ otw nonobs dzs-->Med Rx.   Chronic back pain    Chronic knee pain    a. 09/2015 s/p R TKA.   Chronic pain    Chronic shoulder pain    Chronic sinusitis    CKD (chronic kidney disease), stage IV ([place])    stage 4 per office visit note of Dr Lowanda Foster on 05/2015    COPD (chronic obstructive pulmonary disease) ([place])    Diabetes mellitus without complication ([place])    type II    Essential hypertension    GERD (gastroesophageal reflux disease)    Gout    Gout    Hepatomegaly    noted on noncontrast CT 2015   History of hiatal hernia    Hyperlipidemia    Lateral meniscus tear    Obesity    Truncal   Obstructive sleep apnea    does not use cpap    On home oxygen therapy    uses 2l when is going somewhere per patient    PUD (peptic ulcer disease)    remote, reports f/u EGD about 8 years ago unremarkable    Reactive airway disease    related to exposure to chemical during 9/11   Renal insufficiency    Sinusitis    Vitamin D deficiency     Past Surgical History:  Procedure Laterality Date   ASAD LT SHOULDER   12/15/2008   left shoulder   AV FISTULA PLACEMENT Left 08/09/2016   Procedure: BRACHIOCEPHALIC ARTERIOVENOUS (AV) FISTULA CREATION LEFT ARM;  Surgeon: [name], MD;  Location: [facility];  Service: Vascular;  Laterality: Left;   CAPD INSERTION N/A 10/07/2018   Procedure: LAPAROSCOPIC PERITONEAL CATHETER PLACEMENT;  Surgeon: Mickeal Skinner, MD;  Location: [facility];  Service: General;  Laterality: N/A;   CATARACT EXTRACTION W/PHACO Left 03/28/2016   Procedure: CATARACT EXTRACTION PHACO AND INTRAOCULAR LENS PLACEMENT LEFT EYE;  Surgeon: [name], MD;  Location: [facility];  Service: Ophthalmology;  Laterality: Left;  CDE: 4.77   CATARACT EXTRACTION W/PHACO Right 04/11/2016   Procedure: CATARACT EXTRACTION PHACO AND INTRAOCULAR LENS PLACEMENT RIGHT EYE; CDE:  4.74;  Surgeon: [name], MD;  Location: [facility];  Service: Ophthalmology;  Laterality: Right;   COLONOSCOPY  10/15/2008   Fields: Rectal polyp obliterated, not retrieved, hemorrhoids, single ascending colon diverticulum near the CV. Next colonoscopy April 2020   COLONOSCOPY N/A 12/25/2014   SLF: 1. Colorectal polyps (2) removed 2. Small internal hemorrhoids 3. the left colon  is severely redundant. hyperplastic polyps   CORONARY STENT INTERVENTION N/A 07/25/2021   Procedure: CORONARY STENT INTERVENTION;  Surgeon: Sherren Mocha, MD;  Location: [place] [facility];  Service: Cardiovascular;  Laterality: N/A;   DOPPLER ECHOCARDIOGRAPHY     ESOPHAGOGASTRODUODENOSCOPY N/A 12/25/2014   SLF: 1. Anemia most likely due to CRI, gastritis, gastric polyps 2. Moderate non-erosive gastriits and mild duodenitis.  3.TWo large gstric polyps removed.    EYE SURGERY  12/22/2010   tear duct probing-[place]   FOREIGN BODY REMOVAL  03/29/2011   Procedure: REMOVAL FOREIGN BODY EXTREMITY;  Surgeon: Arther Abbott, MD;  Location: [facility];  Service: Orthopedics;  Laterality: Right;  Removal Foreign Body Right Thumb   IR FLUORO GUIDE CV LINE RIGHT   08/06/2018   IR US GUIDE VASC ACCESS RIGHT  08/06/2018   KNEE ARTHROSCOPY  10/16/2007   left   KNEE ARTHROSCOPY WITH LATERAL MENISECTOMY Right 10/14/2015   Procedure: LEFT KNEE ARTHROSCOPY WITH PARTIAL LATERAL MENISECTOMY;  Surgeon: Carole Civil, MD;  [facility];  Service: Orthopedics;  Laterality: Right;   LEFT HEART CATH AND CORONARY ANGIOGRAPHY N/A 07/25/2021   Procedure: LEFT HEART CATH AND CORONARY ANGIOGRAPHY;  Surgeon: Sherren Mocha, MD;  Location: [place] [facility];  Service: Cardiovascular;  Laterality: N/A;   LEFT HEART CATHETERIZATION WITH CORONARY ANGIOGRAM N/A 03/28/2013   Procedure: LEFT HEART CATHETERIZATION WITH CORONARY ANGIOGRAM;  Surgeon: Leonie Man, MD;  Location: [facility];  Service: Cardiovascular;  Laterality: N/A;   NM MYOVIEW LTD     PENILE PROSTHESIS IMPLANT N/A 08/16/2015   Procedure: PENILE PROTHESIS INFLATABLE, three piece, Excisional biopsy of Penile ulcer, Penile molding;  Surgeon: Carolan Clines, MD;  [facility];  Service: Urology;  Laterality: N/A;   PENILE PROSTHESIS IMPLANT N/A 12/24/2017   Procedure: REMOVAL AND  REPLACEMENT  COLOPLAST PENILE PROSTHESIS;  Surgeon: Lucas Mallow, MD;  [facility];  Service: Urology;  Laterality: N/A;   QUADRICEPS TENDON REPAIR  07/21/2011   Procedure: REPAIR QUADRICEP TENDON;  Surgeon: Arther Abbott, MD;  Location: [facility];  Service: Orthopedics;  Laterality: Right;   TOENAIL EXCISION     removed [id number]   UMBILICAL HERNIA REPAIR  07/17/2005   [place]    Family History  Problem Relation Age of Onset   Hypertension Mother        MI   Cancer Mother        breast    Diabetes Mother    Diabetes Father    Hypertension Father    Hypertension Sister    Diabetes Sister    Arthritis Other    Asthma Other    Lung disease Other    Anesthesia problems Neg Hx    Hypotension Neg Hx    Malignant hyperthermia Neg Hx    Pseudochol deficiency Neg Hx    Colon cancer Neg Hx      Social History   Socioeconomic History   Marital status: Married    Spouse name: Not on file   Number of children: 2   Years of education: 12th grade   Highest education level: Not on file  Occupational History   Occupation: disabled   Occupation:      Fish farm manager: UNEMPLOYED  Tobacco Use   Smoking status: Former    Packs/day: 1.00    Years: 25.00    Pack years: 25.00    Types: Cigarettes    Quit date: 03/27/2010    Years since quitting: 11.7   Smokeless  tobacco: Never   Tobacco comments:    Quit x 7 years  Vaping Use   Vaping Use: Never used  Substance and Sexual Activity   Alcohol use: Yes    Alcohol/week: 0.0 standard drinks    Comment: occasionally   Drug use: Yes    Types: Marijuana    Comment: cocaine- last time used- 11/24/2017 , marijuana-    Sexual activity: Yes  Other Topics Concern   Not on file  Social History Narrative   He quit smoking in 2010. 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 He developed pulmonary problems, became disabled because of lower airway disease in 2009.       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   Social Determinants of Health   Financial Resource Strain: Not on file  Food Insecurity: Not on file  Transportation Needs: Not on file  Physical Activity: Not on file  Stress: Not on file  Social Connections: Not on file  Intimate Partner Violence: Not on file     Physical Exam   Vitals:   12/14/21 0600 12/14/21 0630  BP: 115/76 123/66  Pulse: 74 67  Resp: 16 18  Temp:    SpO2: 95% 96%    CONSTITUTIONAL: Well-appearing, NAD NEURO/PSYCH:  Alert and oriented x 3, no focal deficits EYES:  eyes equal and reactive ENT/NECK:  no LAD, no JVD CARDIO: Regular rate, well-perfused, normal S1 and S2 PULM:  CTAB no wheezing or rhonchi GI/GU:  non-distended, non-tender MSK/SPINE:  No gross deformities, no edema SKIN:  no rash, atraumatic   *Additional and/or pertinent findings included in  MDM below  Diagnostic and Interventional Summary    EKG Interpretation  Date/Time:  Tuesday Dec 13 2021 23:20:56 EDT Ventricular Rate:  91 PR Interval:  320 QRS Duration: 116 QT Interval:  358 QTC Calculation: 440 R Axis:   -36 Text Interpretation: Sinus rhythm with 1st degree A-V block Left axis deviation Left ventricular hypertrophy with QRS widening ( R in aVL , Cornell product ) T wave abnormality, consider lateral ischemia Abnormal ECG When compared with ECG of 15-Aug-2021 03:04, PREVIOUS ECG IS PRESENT Confirmed by Gerlene Fee [phone]) on 12/14/2021 12:56:53 AM       Labs Reviewed  BASIC METABOLIC PANEL - Abnormal; Notable for the following components:      Result Value   Glucose, Bld 123 (*)    BUN 67 (*)    Creatinine, Ser 18.41 (*)    GFR, Estimated 2 (*)    All other components within normal limits  CBC - Abnormal; Notable for the following components:   RBC 3.41 (*)    Hemoglobin 10.6 (*)    HCT 32.8 (*)    nRBC 0.3 (*)    All other components within normal limits  TROPONIN I (HIGH SENSITIVITY) - Abnormal; Notable for the following components:   Troponin I (High Sensitivity) 51 (*)    All other components within normal limits  TROPONIN I (HIGH SENSITIVITY) - Abnormal; Notable for the following components:   Troponin I (High Sensitivity) 53 (*)    All other components within normal limits    DG Chest 2 View  Final Result      Medications  aspirin tablet 325 mg (325 mg Oral Given 12/14/21 0146)  HYDROmorphone (DILAUDID) injection 0.5 mg (0.5 mg Intravenous Given 12/14/21 0143)  metoprolol succinate (TOPROL-XL) 24 hr tablet 25 mg (25 mg Oral Given 12/14/21 0323)  nitroGLYCERIN (NITROSTAT) SL tablet  0.4 mg (0.4 mg Sublingual Given 12/14/21 0324)  ondansetron (ZOFRAN-ODT) disintegrating tablet 4 mg (4 mg Oral Given 12/14/21 0323)  oxyCODONE (Oxy IR/ROXICODONE) immediate release tablet 5 mg (5 mg Oral Given 12/14/21 0323)     Procedures  /  Critical  Care Procedures  ED Course and Medical Decision Making  Initial Impression and Ddx Concern for cardiac chest pain given patient's history and description.  Vital signs reassuring.  Overall doubt PE, doubt dissection.  EKG is largely unchanged, troponins flat at 53.  Case was discussed with [facility] cardiology, patient feeling generally well, could potentially go home with close follow-up on new beta-blocker and nitro tablets.  Past medical/surgical history that increases complexity of ED encounter: CAD  Interpretation of Diagnostics I personally reviewed the EKG and my interpretation is as follows: Sinus rhythm, no significant changes from prior    Labs reveal no significant blood count or electrolyte disturbance.  Patient Reassessment and Ultimate Disposition/Management Patient continuing to have pain and nausea, given first dose, took, nitro, oxycodone.  Reevaluated a few hours later and still having intermittent pain, 7 out of 10, uncomfortable going home.  Case rediscussed with cardiology, will request hospitalist admission.  7 AM update: On reassessment patient is feeling all the way better.  We discussed options, admission versus home management with new medications per the original plan from cardiology.  Patient prefers going home, will come back if symptoms worsen.  Patient management required discussion with the following services or consulting groups:  Hospitalist Service and Cardiology  Complexity of Problems Addressed Acute illness or injury that poses threat of life of bodily function  Additional Data Reviewed and Analyzed Further history obtained from: Further history from spouse/family member  Additional Factors Impacting ED Encounter Risk Use of parenteral controlled substances and Consideration of hospitalization  Barth Kirks. Sedonia Small, MD [place] [email or web address]  Final Clinical Impressions(s) / ED Diagnoses      ICD-10-CM   1. Chest pain, unspecified type  R07.9       ED Discharge Orders          Ordered    metoprolol succinate (TOPROL-XL) 25 MG 24 hr tablet  Daily        12/14/21 0705    nitroGLYCERIN (NITROSTAT) 0.4 MG SL tablet  Every 5 min PRN        12/14/21 0705             Discharge Instructions Discussed with and Provided to Patient:     Discharge Instructions      You were evaluated in the Emergency Department and after careful evaluation, we did not find any emergent condition requiring admission or further testing in the hospital.  You may be experiencing angina pain related to your heart.  Very important that you follow-up closely with your cardiologist.  We discussed your case with the on-call cardiologist here at [facility]  Recommend starting a few new medications.  Start taking the Toprol medication daily but do not start this medicine until Thursday morning.  You can take the nitro tablets as needed for pain or discomfort.  Please return to the Emergency Department if you experience any worsening of your condition.   Thank you for allowing [place] to be a part of your care.       Maudie Flakes, MD 12/14/21 2703    Maudie Flakes, MD 12/14/21 [phone]

## 2021-12-14 NOTE — Discharge Instructions (Signed)
You were evaluated in the Emergency Department and after careful evaluation, we did not find any emergent condition requiring admission or further testing in the hospital.  You may be experiencing angina pain related to your heart.  Very important that you follow-up closely with your cardiologist.  We discussed your case with the on-call cardiologist here at [facility].  Recommend starting a few new medications.  Start taking the Toprol medication daily but do not start this medicine until Thursday morning.  You can take the nitro tablets as needed for pain or discomfort.  Please return to the Emergency Department if you experience any worsening of your condition.   Thank you for allowing [place] to be a part of your care.

## 2021-12-15 ENCOUNTER — Ambulatory Visit: Payer: Medicare Other | Admitting: Nurse Practitioner

## 2021-12-15 DIAGNOSIS — D509 Iron deficiency anemia, unspecified: Secondary | ICD-10-CM | POA: Diagnosis not present

## 2021-12-15 DIAGNOSIS — N250 Renal osteodystrophy: Secondary | ICD-10-CM | POA: Diagnosis not present

## 2021-12-15 DIAGNOSIS — N186 End stage renal disease: Secondary | ICD-10-CM | POA: Diagnosis not present

## 2021-12-15 DIAGNOSIS — D631 Anemia in chronic kidney disease: Secondary | ICD-10-CM | POA: Diagnosis not present

## 2021-12-15 DIAGNOSIS — Z992 Dependence on renal dialysis: Secondary | ICD-10-CM | POA: Diagnosis not present

## 2021-12-16 ENCOUNTER — Encounter (HOSPITAL_COMMUNITY)
Admission: RE | Admit: 2021-12-16 | Discharge: 2021-12-16 | Disposition: A | Discharge status: Home / Self Care | Payer: Medicare Other | Source: Ambulatory Visit | Attending: Internal Medicine | Admitting: Internal Medicine

## 2021-12-16 DIAGNOSIS — N186 End stage renal disease: Secondary | ICD-10-CM | POA: Diagnosis not present

## 2021-12-16 DIAGNOSIS — Z955 Presence of coronary angioplasty implant and graft: Secondary | ICD-10-CM | POA: Insufficient documentation

## 2021-12-16 DIAGNOSIS — I214 Non-ST elevation (NSTEMI) myocardial infarction: Secondary | ICD-10-CM | POA: Insufficient documentation

## 2021-12-16 DIAGNOSIS — D631 Anemia in chronic kidney disease: Secondary | ICD-10-CM | POA: Diagnosis not present

## 2021-12-16 DIAGNOSIS — Z9861 Coronary angioplasty status: Secondary | ICD-10-CM | POA: Insufficient documentation

## 2021-12-16 DIAGNOSIS — D509 Iron deficiency anemia, unspecified: Secondary | ICD-10-CM | POA: Diagnosis not present

## 2021-12-16 DIAGNOSIS — N250 Renal osteodystrophy: Secondary | ICD-10-CM | POA: Diagnosis not present

## 2021-12-16 DIAGNOSIS — Z992 Dependence on renal dialysis: Secondary | ICD-10-CM | POA: Diagnosis not present

## 2021-12-16 NOTE — Progress Notes (Signed)
Daily Session Note  Patient Details  Name: [name] [name] MRN: [id number] Date of Birth: Nov 19, 1950 Referring Provider:   Flowsheet Row CARDIAC REHAB PHASE II ORIENTATION from 09/29/2021 in [place]  Referring Provider Dr. Kurtis Bushman       Encounter Date: 12/16/2021  Check In:  Session Check In - 12/16/21 1100       Check-In   Supervising physician immediately available to respond to emergencies [facility] MD immediately available    Physician(s) Dr Gardiner Rhyme    Location [facility]    Staff Present [name], BS, Exercise Physiologist;Debra Wynetta Emery, RN, Joanette Gula, RN, Bjorn Loser, MS, ACSM-CEP, Exercise Physiologist    Virtual Visit No    Medication changes reported     No    Fall or balance concerns reported    No    Tobacco Cessation No Change    Warm-up and Cool-down Performed as group-led instruction    Resistance Training Performed Yes    VAD Patient? No    PAD/SET Patient? No      Pain Assessment   Currently in Pain? No/denies    Pain Score 0-No pain    Multiple Pain Sites No             Capillary Blood Glucose: No results found for this or any previous visit (from the past 24 hour(s)).    Social History   Tobacco Use  Smoking Status Former   Packs/day: 1.00   Years: 25.00   Pack years: 25.00   Types: Cigarettes   Quit date: 03/27/2010   Years since quitting: 11.7  Smokeless Tobacco Never  Tobacco Comments   Quit x 7 years    Goals Met:  Independence with exercise equipment Exercise tolerated well No report of concerns or symptoms today Strength training completed today  Goals Unmet:  Not Applicable  Comments: checkout at 1200.   Dr. Carlyle Dolly is Medical Director for [facility]

## 2021-12-17 DIAGNOSIS — N250 Renal osteodystrophy: Secondary | ICD-10-CM | POA: Diagnosis not present

## 2021-12-17 DIAGNOSIS — D509 Iron deficiency anemia, unspecified: Secondary | ICD-10-CM | POA: Diagnosis not present

## 2021-12-17 DIAGNOSIS — D631 Anemia in chronic kidney disease: Secondary | ICD-10-CM | POA: Diagnosis not present

## 2021-12-17 DIAGNOSIS — Z992 Dependence on renal dialysis: Secondary | ICD-10-CM | POA: Diagnosis not present

## 2021-12-17 DIAGNOSIS — N186 End stage renal disease: Secondary | ICD-10-CM | POA: Diagnosis not present

## 2021-12-17 LAB — HEMOGLOBIN A1C: Hemoglobin A1C: 6.3

## 2021-12-18 DIAGNOSIS — Z992 Dependence on renal dialysis: Secondary | ICD-10-CM | POA: Diagnosis not present

## 2021-12-18 DIAGNOSIS — D631 Anemia in chronic kidney disease: Secondary | ICD-10-CM | POA: Diagnosis not present

## 2021-12-18 DIAGNOSIS — N186 End stage renal disease: Secondary | ICD-10-CM | POA: Diagnosis not present

## 2021-12-18 DIAGNOSIS — N250 Renal osteodystrophy: Secondary | ICD-10-CM | POA: Diagnosis not present

## 2021-12-18 DIAGNOSIS — D509 Iron deficiency anemia, unspecified: Secondary | ICD-10-CM | POA: Diagnosis not present

## 2021-12-19 ENCOUNTER — Encounter (HOSPITAL_COMMUNITY)
Admission: RE | Admit: 2021-12-19 | Discharge: 2021-12-19 | Disposition: A | Discharge status: Home / Self Care | Payer: Medicare Other | Source: Ambulatory Visit | Attending: Internal Medicine | Admitting: Internal Medicine

## 2021-12-19 DIAGNOSIS — Z955 Presence of coronary angioplasty implant and graft: Secondary | ICD-10-CM | POA: Diagnosis not present

## 2021-12-19 DIAGNOSIS — D631 Anemia in chronic kidney disease: Secondary | ICD-10-CM | POA: Diagnosis not present

## 2021-12-19 DIAGNOSIS — Z9861 Coronary angioplasty status: Secondary | ICD-10-CM | POA: Diagnosis not present

## 2021-12-19 DIAGNOSIS — D509 Iron deficiency anemia, unspecified: Secondary | ICD-10-CM | POA: Diagnosis not present

## 2021-12-19 DIAGNOSIS — I214 Non-ST elevation (NSTEMI) myocardial infarction: Secondary | ICD-10-CM

## 2021-12-19 DIAGNOSIS — N250 Renal osteodystrophy: Secondary | ICD-10-CM | POA: Diagnosis not present

## 2021-12-19 DIAGNOSIS — Z992 Dependence on renal dialysis: Secondary | ICD-10-CM | POA: Diagnosis not present

## 2021-12-19 DIAGNOSIS — N186 End stage renal disease: Secondary | ICD-10-CM | POA: Diagnosis not present

## 2021-12-19 LAB — GLUCOSE, CAPILLARY: Glucose-Capillary: 117 mg/dL — ABNORMAL HIGH (ref 70–99)

## 2021-12-19 NOTE — Progress Notes (Addendum)
Incomplete Session Note  Patient Details  Name: PAL SHELL MRN: [id number] Date of Birth: [birth year] Referring Provider:   Flowsheet Row CARDIAC REHAB PHASE II ORIENTATION from 09/29/2021 in [place]  Referring Provider Dr. Rachel Bo did not complete his rehab session.  Pt complained of feeling nauseous and burning in his chest at halfway during first set of exercise. His BP was 150/80 and sat down and drank fluids. His BP went up to 160/60 and his blood sugar was 117 mg/dL. Pt advised to make an follow up appointment with ER Dr. Jaymes Graff PCP after recent ED visit. Pt was advised to go home.

## 2021-12-20 ENCOUNTER — Telehealth: Payer: Self-pay | Admitting: Internal Medicine

## 2021-12-20 ENCOUNTER — Telehealth: Payer: Self-pay

## 2021-12-20 DIAGNOSIS — D631 Anemia in chronic kidney disease: Secondary | ICD-10-CM | POA: Diagnosis not present

## 2021-12-20 DIAGNOSIS — N186 End stage renal disease: Secondary | ICD-10-CM | POA: Diagnosis not present

## 2021-12-20 DIAGNOSIS — N250 Renal osteodystrophy: Secondary | ICD-10-CM | POA: Diagnosis not present

## 2021-12-20 DIAGNOSIS — D509 Iron deficiency anemia, unspecified: Secondary | ICD-10-CM | POA: Diagnosis not present

## 2021-12-20 DIAGNOSIS — Z992 Dependence on renal dialysis: Secondary | ICD-10-CM | POA: Diagnosis not present

## 2021-12-20 NOTE — Telephone Encounter (Signed)
-  Pt called stating he developed chest discomfort and shortness of breath during cardiac rehab yesterday. -Pt describe symptoms as a sharp pain and state symptoms felt like when he had his MI in January. -Pt developed similar symptoms on 5/31 as was evaluated in the ER -Pt denies symptoms currently  Appointment scheduled for 6/8 for further evaluations. Pt also made aware of ED precaution should any new symptoms develop or worsen.

## 2021-12-20 NOTE — Telephone Encounter (Incomplete)
Patient called in stating he is having LUE numbness for a few weeks. Pt currently dialyzes at home from PD cath. He states he gets his supplies from [place] in [place]. Pt denies excessive coldness, hand locking up, no wounds and/or discoloration.

## 2021-12-20 NOTE — Telephone Encounter (Signed)
Pt c/o Syncope: STAT if syncope occurred within 30 minutes and pt complains of lightheadedness High Priority if episode of passing out, completely, today or in last 24 hours   Did you pass out today? No    When is the last time you passed out? yesterday   Has this occurred multiple times? Yes    Did you have any symptoms prior to passing out? Burning in chest, nauseated

## 2021-12-21 ENCOUNTER — Encounter (HOSPITAL_COMMUNITY): Payer: Medicare Other

## 2021-12-21 DIAGNOSIS — Z992 Dependence on renal dialysis: Secondary | ICD-10-CM | POA: Diagnosis not present

## 2021-12-21 DIAGNOSIS — D509 Iron deficiency anemia, unspecified: Secondary | ICD-10-CM | POA: Diagnosis not present

## 2021-12-21 DIAGNOSIS — N250 Renal osteodystrophy: Secondary | ICD-10-CM | POA: Diagnosis not present

## 2021-12-21 DIAGNOSIS — D631 Anemia in chronic kidney disease: Secondary | ICD-10-CM | POA: Diagnosis not present

## 2021-12-21 DIAGNOSIS — N186 End stage renal disease: Secondary | ICD-10-CM | POA: Diagnosis not present

## 2021-12-21 NOTE — Progress Notes (Signed)
Office Visit    Patient Name: [name] [name] Date of Encounter: 12/21/2021  Primary Care Provider:  Carrolyn Meiers, MD Primary Cardiologist:  Pixie Casino, MD Primary Electrophysiologist: None  Chief Complaint    [name] [name] is a 71 y.o. male with PMH of HTN, HLD, CAD s/p MI 2014 and NSTEMI 2023 treated with DES x1 to proximal RCA lesion with angioplasty to mid RCA lesions IDDM, OSA, polysubstance abuse with cocaine, ESRD on home peritoneal dialysis presents today for follow-up of coronary artery disease.  Past Medical History    Past Medical History:  Diagnosis Date   Anemia    Arthritis    Asthma    Bell palsy    CAD (coronary artery disease)    a. 2014 MV: abnl w/ infap ischemia; b. 03/2013 Cath: aneurysmal bleb in the LAD w/ otw nonobs dzs-->Med Rx.   Chronic back pain    Chronic knee pain    a. 09/2015 s/p R TKA.   Chronic pain    Chronic shoulder pain    Chronic sinusitis    CKD (chronic kidney disease), stage IV ([place])    stage 4 per office visit note of Dr Lowanda Foster on 05/2015    COPD (chronic obstructive pulmonary disease) ([place])    Diabetes mellitus without complication ([place])    type II    Essential hypertension    GERD (gastroesophageal reflux disease)    Gout    Gout    Hepatomegaly    noted on noncontrast CT 2015   History of hiatal hernia    Hyperlipidemia    Lateral meniscus tear    Obesity    Truncal   Obstructive sleep apnea    does not use cpap    On home oxygen therapy    uses 2l when is going somewhere per patient    PUD (peptic ulcer disease)    remote, reports f/u EGD about 8 years ago unremarkable    Reactive airway disease    related to exposure to chemical during 9/11   Renal insufficiency    Sinusitis    Vitamin D deficiency    Past Surgical History:  Procedure Laterality Date   ASAD LT SHOULDER  12/15/2008   left shoulder   AV FISTULA PLACEMENT Left 08/09/2016   Procedure: BRACHIOCEPHALIC ARTERIOVENOUS (AV) FISTULA  CREATION LEFT ARM;  Surgeon: [name], MD;  Location: [facility];  Service: Vascular;  Laterality: Left;   CAPD INSERTION N/A 10/07/2018   Procedure: LAPAROSCOPIC PERITONEAL CATHETER PLACEMENT;  Surgeon: Mickeal Skinner, MD;  Location: [facility];  Service: General;  Laterality: N/A;   CATARACT EXTRACTION W/PHACO Left 03/28/2016   Procedure: CATARACT EXTRACTION PHACO AND INTRAOCULAR LENS PLACEMENT LEFT EYE;  Surgeon: [name], MD;  Location: [facility];  Service: Ophthalmology;  Laterality: Left;  CDE: 4.77   CATARACT EXTRACTION W/PHACO Right 04/11/2016   Procedure: CATARACT EXTRACTION PHACO AND INTRAOCULAR LENS PLACEMENT RIGHT EYE; CDE:  4.74;  Surgeon: [name], MD;  Location: [facility];  Service: Ophthalmology;  Laterality: Right;   COLONOSCOPY  10/15/2008   Fields: Rectal polyp obliterated, not retrieved, hemorrhoids, single ascending colon diverticulum near the CV. Next colonoscopy April 2020   COLONOSCOPY N/A 12/25/2014   SLF: 1. Colorectal polyps (2) removed 2. Small internal hemorrhoids 3. the left colon is severely redundant. hyperplastic polyps   CORONARY STENT INTERVENTION N/A 07/25/2021   Procedure: CORONARY STENT INTERVENTION;  Surgeon: Sherren Mocha, MD;  Location: [facility];  Service: Cardiovascular;  Laterality: N/A;   DOPPLER ECHOCARDIOGRAPHY     ESOPHAGOGASTRODUODENOSCOPY N/A 12/25/2014   SLF: 1. Anemia most likely due to CRI, gastritis, gastric polyps 2. Moderate non-erosive gastriits and mild duodenitis.  3.TWo large gstric polyps removed.    EYE SURGERY  12/22/2010   tear duct probing-[place]   FOREIGN BODY REMOVAL  03/29/2011   Procedure: REMOVAL FOREIGN BODY EXTREMITY;  Surgeon: Arther Abbott, MD;  Location: [facility];  Service: Orthopedics;  Laterality: Right;  Removal Foreign Body Right Thumb   IR FLUORO GUIDE CV LINE RIGHT  08/06/2018   IR US GUIDE VASC ACCESS RIGHT  08/06/2018   KNEE ARTHROSCOPY  10/16/2007   left   KNEE ARTHROSCOPY WITH LATERAL  MENISECTOMY Right 10/14/2015   Procedure: LEFT KNEE ARTHROSCOPY WITH PARTIAL LATERAL MENISECTOMY;  Surgeon: Carole Civil, MD;  Location: [facility];  Service: Orthopedics;  Laterality: Right;   LEFT HEART CATH AND CORONARY ANGIOGRAPHY N/A 07/25/2021   Procedure: LEFT HEART CATH AND CORONARY ANGIOGRAPHY;  Surgeon: Sherren Mocha, MD;  Location: [facility];  Service: Cardiovascular;  Laterality: N/A;   LEFT HEART CATHETERIZATION WITH CORONARY ANGIOGRAM N/A 03/28/2013   Procedure: LEFT HEART CATHETERIZATION WITH CORONARY ANGIOGRAM;  Surgeon: Leonie Man, MD;  Location: [facility];  Service: Cardiovascular;  Laterality: N/A;   NM MYOVIEW LTD     PENILE PROSTHESIS IMPLANT N/A 08/16/2015   Procedure: PENILE PROTHESIS INFLATABLE, three piece, Excisional biopsy of Penile ulcer, Penile molding;  Surgeon: Carolan Clines, MD;  Location: [facility];  Service: Urology;  Laterality: N/A;   PENILE PROSTHESIS IMPLANT N/A 12/24/2017   Procedure: REMOVAL AND  REPLACEMENT  COLOPLAST PENILE PROSTHESIS;  Surgeon: Lucas Mallow, MD;  Location: [facility];  Service: Urology;  Laterality: N/A;   QUADRICEPS TENDON REPAIR  07/21/2011   Procedure: REPAIR QUADRICEP TENDON;  Surgeon: Arther Abbott, MD;  Location: [facility];  Service: Orthopedics;  Laterality: Right;   TOENAIL EXCISION     removed [id number]   UMBILICAL HERNIA REPAIR  07/17/2005   [place]    Allergies  Allergies  Allergen Reactions   Opana [Oxymorphone Hcl] Itching   Oxymorphone Itching   Tramadol Itching    History of Present Illness    [name] [name] is a 71 year old male with the above-mentioned past medical history who presents today for follow-up of coronary artery disease following NSTEMI in January 2023.  Mr. Fyock is a former patient of Dr. Rollene Fare who is now followed by Dr. Debara Pickett.  Patient had initial left heart cath in 03/2013 following abnormal Myoview that revealed minimal CAD with no significant disease.  He was seen  in the ED 08/2018 with complaint of left-sided intermittent chest pain.  ACS was ruled out and 2D echo was completed with EF of 50-55% and severely increased left ventricular wall, moderately dilated RA and LA, with normal valve function.  Nuclear stress test was also completed that revealed low risk normal study.  Patient contacted cardiology office on 07/22/2021 with complaint for chest pain while loading wood and was advised to go to the ED.  In the ED his chest pain resolved prior to examination.  His work-up was significant for Mobitz 1 second-degree AV block and troponins were elevated at 3000 428.  Patient was ruled in for NSTEMI and started on aspirin and full dose heparin.  He was transferred to [facility] for left heart cath that revealed severe RCA stenosis treated with drug-eluting stent and angioplasty.  He was started on aspirin and Brilinta for 1 year.  2D echo completed following cath with EF of 55-60% and mild MVR with mildly dilated LA and RA, with pseudo normalization of diastolic function.  He also tested positive for COVID-19 with chest x-ray clear for pulmonary disease.  He returned to the emergency room 2 days later with complaint of hypotension and losartan was discontinued.  He was seen in follow-up on 09/01/2021 by Christen Bame, NP.  He was scheduled to have follow-up with pulmonology and was noted to have labile BP at home.  He was also experiencing dyspnea but with no CP and was felt not related to ischemia.  He was seen later on 12/01/2021 by Dr. Debara Pickett and reported doing well with no complaints of chest pain.  No medication changes were made at that time.  Mr. Zanetti returned to [facility], ED with complaint of chest pain. His pain was described as nonradiating and associated with nausea.  Pain was 7 out of 10 and intermittent in nature resolved with nitro and oxycodone.  He was given the option of admission versus going home and he chose to go home.  Patient reported to cardiac  rehab on 12/19/2021 and during exercise developed nausea and burning chest pain.  Patient states he felt better after drinking ginger ale and was advised to follow-up with cardiologist.  Since last being seen in the office patient reports that he is been experiencing anginal symptoms with shortness of breath.  He is accompanied today by his wife who states that these complaints originally occurred after drinking cold water in the middle of the night.  EKG obtained today which revealed new second-degree AV block that is asymptomatic. Today he denies chest pain but does report pain yesterday evening that resolved with Pepto-Bismol.  Patient wife also states that he admits 3 doses of DAPT last month and has not taken his prescribed dose of Toprol-XL since his heart attack.  Patient denies chest pain, palpitations, dyspnea, PND, orthopnea, nausea, vomiting, dizziness, syncope, edema, weight gain, or early satiety.  Home Medications    Current Outpatient Medications  Medication Sig Dispense Refill   albuterol (VENTOLIN HFA) 108 (90 Base) MCG/ACT inhaler Inhale 2 puffs into the lungs every 6 (six) hours as needed for wheezing or shortness of breath. 18 g 11   aspirin EC 81 MG tablet Take 81 mg by mouth daily. Swallow whole.     budesonide-formoterol (SYMBICORT) 160-4.5 MCG/ACT inhaler Inhale 2 puffs into the lungs 2 (two) times daily. 1 each 6   calcitRIOL (ROCALTROL) 0.25 MCG capsule Take 0.25 mcg by mouth daily.     glucose blood test strip 1 each by Other route 2 (two) times daily. Use as instructed to check blood glucose twice daily 200 each 2   hydrocortisone (ANUSOL-HC) 2.5 % rectal cream Place 1 application rectally 2 (two) times daily. 30 g 1   Insulin Pen Needle (DROPLET PEN NEEDLES) 31G X 5 MM MISC Use to inject insulin daily as directed 150 each 2   LANTUS SOLOSTAR 100 UNIT/ML Solostar Pen INJECT  30 UNITS SUBCUTANEOUSLY AT BEDTIME (Patient taking differently: 30 Units at bedtime.) 30 mL 1    losartan (COZAAR) 25 MG tablet Take 1 tablet (25 mg total) by mouth daily. 30 tablet 6   metoprolol succinate (TOPROL-XL) 25 MG 24 hr tablet Take 1 tablet (25 mg total) by mouth daily. 30 tablet 1   nitroGLYCERIN (NITROSTAT) 0.4 MG SL tablet Place 1 tablet (0.4 mg  total) under the tongue every 5 (five) minutes as needed for chest pain. 30 tablet 1   pantoprazole (PROTONIX) 40 MG tablet Take 1 tablet (40 mg total) by mouth 2 (two) times daily before a meal. 60 tablet 5   simvastatin (ZOCOR) 20 MG tablet Take 20 mg by mouth every morning.     ticagrelor (BRILINTA) 90 MG TABS tablet Take 1 tablet (90 mg total) by mouth 2 (two) times daily. 60 tablet 3   torsemide (DEMADEX) 100 MG tablet Take 100 mg by mouth daily.     TRADJENTA 5 MG TABS tablet TAKE 1 TABLET EVERY DAY 90 tablet 1   Vitamin D, Ergocalciferol, (DRISDOL) 1.25 MG (50000 UNIT) CAPS capsule Take 50,000 Units by mouth every 7 (seven) days.     No current facility-administered medications for this visit.     Review of Systems  Please see the history of present illness.    (+) Chest pressure (+) Nausea  All other systems reviewed and are otherwise negative except as noted above.  Physical Exam    Wt Readings from Last 3 Encounters:  12/05/21 239 lb 10.2 oz (108.7 kg)  12/01/21 236 lb 12.8 oz (107.4 kg)  11/21/21 239 lb 3.2 oz (108.5 kg)   OF:BPZWC were no vitals filed for this visit.,There is no height or weight on file to calculate BMI.  Constitutional:      Appearance: Healthy appearance. Not in distress.  Neck:     Vascular: JVD normal.  Pulmonary:     Effort: Pulmonary effort is normal.     Breath sounds: No wheezing. No rales. Diminished in the bases Cardiovascular:     Normal rate. Regular rhythm. Normal S1. Normal S2.      Murmurs: There is no murmur.  Edema:    Peripheral edema absent.  Abdominal:     Palpations: Abdomen is soft non tender. There is no hepatomegaly.  Skin:    General: Skin is warm and dry.   Neurological:     General: No focal deficit present.     Mental Status: Alert and oriented to person, place and time.     Cranial Nerves: Cranial nerves are intact.  EKG/LABS/Other Studies Reviewed    ECG personally reviewed by me today -sinus rhythm with second-degree AV block Mobitz type I with left axis deviation and left bundle branch block.   Lab Results  Component Value Date   WBC 6.6 12/14/2021   HGB 10.6 (L) 12/14/2021   HCT 32.8 (L) 12/14/2021   MCV 96.2 12/14/2021   PLT 166 12/14/2021   Lab Results  Component Value Date   CREATININE 18.41 (H) 12/14/2021   BUN 67 (H) 12/14/2021   NA 135 12/14/2021   K 3.7 12/14/2021   CL 98 12/14/2021   CO2 25 12/14/2021   Lab Results  Component Value Date   ALT 21 08/14/2021   AST 18 08/14/2021   ALKPHOS 92 08/14/2021   BILITOT 0.3 08/14/2021   Lab Results  Component Value Date   CHOL 99 11/23/2021   HDL 29 (L) 11/23/2021   LDLCALC 47 11/23/2021   TRIG 148 11/23/2021   CHOLHDL 3.4 11/23/2021    Lab Results  Component Value Date   HGBA1C 6.6 (H) 07/22/2021    Assessment & Plan    1.  Coronary artery disease: - s/p NSTEMI 07/2021 treated with DES x1 to proximal and mid RCA lesion. -Patient was recently seen in ED on May 31 and presents today with complaint  of unstable angina. -DOD consulted based on patient's presenting symptoms and new onset heart block  -Recommendation to repeat LHC to reevaluate coronary arteries due to complaint of unstable angina -Start Imdur 30 mg daily -Patient advised to seek care at nearest ED if symptoms recur and pain develops that is not relieved with sublingual nitroglycerin -Continue DAPT with Brilinta 90 mg twice daily and 81 mg ASA daily and no bleeding issues noted. -Continue GDMT with 81 mg ASA and Zocor 20 mg daily -Discontinue Toprol XL 25 mg due to new onset second-degree AV block type I -BMET and CBC today  2.  HFpEF: -2D echo completed 07/2021 with EF of 55-60% -Patient  appears euvolemic on examination today -Continue losartan 25 mg daily and torsemide 20 mg daily -Low sodium diet, fluid restriction <2L, and daily weights encouraged. Educated to contact our office for weight gain of 2 lbs overnight or 5 lbs in one week.   3.  Hyperlipidemia: -Continue Zocor 20 mg daily -Last LDL cholesterol was 47 on 11/23/2021 -Continue low-sodium heart healthy diet  4.  ESRD: -Patient performs peritoneal dialysis at home and is managed by nephrology  5. Essential hypertension: -Blood pressure well controlled at 128/70 -Continue current antihypertensive regimen as noted above -Discontinue Toprol-XL as noted above  6.  Obesity: -Current BMI is 39.31kg/m -Patient advised to reduce calorie intake and continue exercise routine after cardiac rehab is complete   Disposition: Follow-up with Pixie Casino, MD or APP in 1 months Shared Decision Making/Informed Consent The risks [stroke (1 in 1000), death (1 in 1000), kidney failure [usually temporary] (1 in 500), bleeding (1 in 200), allergic reaction [possibly serious] (1 in 200)], benefits (diagnostic support and management of coronary artery disease) and alternatives of a cardiac catheterization were discussed in detail with Mr. Pellicane and he is willing to proceed.   Medication Adjustments/Labs and Tests Ordered: Current medicines are reviewed at length with the patient today.  Concerns regarding medicines are outlined above.   Signed, Mable Fill, Marissa Nestle, NP 12/21/2021, 12:10 PM [place]

## 2021-12-21 NOTE — Progress Notes (Incomplete)
Called patient to instruct to hold Rehab today and seen MD as schedule on June 8th and bring note to return to CR.  Patient understands and will bring note upon return to Clinic.

## 2021-12-21 NOTE — H&P (View-Only) (Signed)
Office Visit    Patient Name: [name] [name] Date of Encounter: 12/21/2021  Primary Care Provider:  Carrolyn Meiers, MD Primary Cardiologist:  Pixie Casino, MD Primary Electrophysiologist: None  Chief Complaint    [name] [name] is a 71 y.o. male with PMH of HTN, HLD, CAD s/p MI 2014 and NSTEMI 2023 treated with DES x1 to proximal RCA lesion with angioplasty to mid RCA lesions IDDM, OSA, polysubstance abuse with cocaine, ESRD on home peritoneal dialysis presents today for follow-up of coronary artery disease.  Past Medical History    Past Medical History:  Diagnosis Date   Anemia    Arthritis    Asthma    Bell palsy    CAD (coronary artery disease)    a. 2014 MV: abnl w/ infap ischemia; b. 03/2013 Cath: aneurysmal bleb in the LAD w/ otw nonobs dzs-->Med Rx.   Chronic back pain    Chronic knee pain    a. 09/2015 s/p R TKA.   Chronic pain    Chronic shoulder pain    Chronic sinusitis    CKD (chronic kidney disease), stage IV ([place])    stage 4 per office visit note of Dr Lowanda Foster on 05/2015    COPD (chronic obstructive pulmonary disease) ([place])    Diabetes mellitus without complication ([place])    type II    Essential hypertension    GERD (gastroesophageal reflux disease)    Gout    Gout    Hepatomegaly    noted on noncontrast CT 2015   History of hiatal hernia    Hyperlipidemia    Lateral meniscus tear    Obesity    Truncal   Obstructive sleep apnea    does not use cpap    On home oxygen therapy    uses 2l when is going somewhere per patient    PUD (peptic ulcer disease)    remote, reports f/u EGD about 8 years ago unremarkable    Reactive airway disease    related to exposure to chemical during 9/11   Renal insufficiency    Sinusitis    Vitamin D deficiency    Past Surgical History:  Procedure Laterality Date   ASAD LT SHOULDER  12/15/2008   left shoulder   AV FISTULA PLACEMENT Left 08/09/2016   Procedure: BRACHIOCEPHALIC ARTERIOVENOUS (AV) FISTULA  CREATION LEFT ARM;  Surgeon: [name], MD;  Location: [facility];  Service: Vascular;  Laterality: Left;   CAPD INSERTION N/A 10/07/2018   Procedure: LAPAROSCOPIC PERITONEAL CATHETER PLACEMENT;  Surgeon: Mickeal Skinner, MD;  Location: [facility];  Service: General;  Laterality: N/A;   CATARACT EXTRACTION W/PHACO Left 03/28/2016   Procedure: CATARACT EXTRACTION PHACO AND INTRAOCULAR LENS PLACEMENT LEFT EYE;  Surgeon: [name], MD;  Location: [facility];  Service: Ophthalmology;  Laterality: Left;  CDE: 4.77   CATARACT EXTRACTION W/PHACO Right 04/11/2016   Procedure: CATARACT EXTRACTION PHACO AND INTRAOCULAR LENS PLACEMENT RIGHT EYE; CDE:  4.74;  Surgeon: [name], MD;  Location: [facility];  Service: Ophthalmology;  Laterality: Right;   COLONOSCOPY  10/15/2008   Fields: Rectal polyp obliterated, not retrieved, hemorrhoids, single ascending colon diverticulum near the CV. Next colonoscopy April 2020   COLONOSCOPY N/A 12/25/2014   SLF: 1. Colorectal polyps (2) removed 2. Small internal hemorrhoids 3. the left colon is severely redundant. hyperplastic polyps   CORONARY STENT INTERVENTION N/A 07/25/2021   Procedure: CORONARY STENT INTERVENTION;  Surgeon: Sherren Mocha, MD;  Location: [facility];  Service: Cardiovascular;  Laterality: N/A;   DOPPLER ECHOCARDIOGRAPHY     ESOPHAGOGASTRODUODENOSCOPY N/A 12/25/2014   SLF: 1. Anemia most likely due to CRI, gastritis, gastric polyps 2. Moderate non-erosive gastriits and mild duodenitis.  3.TWo large gstric polyps removed.    EYE SURGERY  12/22/2010   tear duct probing-[place]   FOREIGN BODY REMOVAL  03/29/2011   Procedure: REMOVAL FOREIGN BODY EXTREMITY;  Surgeon: Arther Abbott, MD;  [facility];  Service: Orthopedics;  Laterality: Right;  Removal Foreign Body Right Thumb   IR FLUORO GUIDE CV LINE RIGHT  08/06/2018   IR US GUIDE VASC ACCESS RIGHT  08/06/2018   KNEE ARTHROSCOPY  10/16/2007   left   KNEE ARTHROSCOPY WITH LATERAL  MENISECTOMY Right 10/14/2015   Procedure: LEFT KNEE ARTHROSCOPY WITH PARTIAL LATERAL MENISECTOMY;  Surgeon: Carole Civil, MD;  [facility];  Service: Orthopedics;  Laterality: Right;   LEFT HEART CATH AND CORONARY ANGIOGRAPHY N/A 07/25/2021   Procedure: LEFT HEART CATH AND CORONARY ANGIOGRAPHY;  Surgeon: Sherren Mocha, MD;  Location: [facility];  Service: Cardiovascular;  Laterality: N/A;   LEFT HEART CATHETERIZATION WITH CORONARY ANGIOGRAM N/A 03/28/2013   Procedure: LEFT HEART CATHETERIZATION WITH CORONARY ANGIOGRAM;  Surgeon: Leonie Man, MD;  Location: [facility];  Service: Cardiovascular;  Laterality: N/A;   NM MYOVIEW LTD     PENILE PROSTHESIS IMPLANT N/A 08/16/2015   Procedure: PENILE PROTHESIS INFLATABLE, three piece, Excisional biopsy of Penile ulcer, Penile molding;  Surgeon: Carolan Clines, MD;  [facility];  Service: Urology;  Laterality: N/A;   PENILE PROSTHESIS IMPLANT N/A 12/24/2017   Procedure: REMOVAL AND  REPLACEMENT  COLOPLAST PENILE PROSTHESIS;  Surgeon: Lucas Mallow, MD;  [facility];  Service: Urology;  Laterality: N/A;   QUADRICEPS TENDON REPAIR  07/21/2011   Procedure: REPAIR QUADRICEP TENDON;  Surgeon: Arther Abbott, MD;  [facility];  Service: Orthopedics;  Laterality: Right;   TOENAIL EXCISION     removed [id number]   UMBILICAL HERNIA REPAIR  07/17/2005   [place]    Allergies  Allergies  Allergen Reactions   Opana [Oxymorphone Hcl] Itching   Oxymorphone Itching   Tramadol Itching    History of Present Illness    [name] [name] is a 71 year old male with the above-mentioned past medical history who presents today for follow-up of coronary artery disease following NSTEMI in January 2023.  [name] [name] is a former patient of Dr. Rollene Fare who is now followed by Dr. Debara Pickett.  Patient had initial left heart cath in 03/2013 following abnormal Myoview that revealed minimal CAD with no significant disease.  He was seen  in the ED 08/2018 with complaint of left-sided intermittent chest pain.  ACS was ruled out and 2D echo was completed with EF of 50-55% and severely increased left ventricular wall, moderately dilated RA and LA, with normal valve function.  Nuclear stress test was also completed that revealed low risk normal study.  Patient contacted cardiology office on 07/22/2021 with complaint for chest pain while loading wood and was advised to go to the ED.  In the ED his chest pain resolved prior to examination.  His work-up was significant for Mobitz 1 second-degree AV block and troponins were elevated at 3000 428.  Patient was ruled in for NSTEMI and started on aspirin and full dose heparin.  He was transferred to [facility] for left heart cath that revealed severe RCA stenosis treated with drug-eluting stent and angioplasty.  He was started on aspirin and Brilinta for 1 year.  2D echo completed following cath with EF of 55-60% and mild MVR with mildly dilated LA and RA, with pseudo normalization of diastolic function.  He also tested positive for COVID-19 with chest x-ray clear for pulmonary disease.  He returned to the emergency room 2 days later with complaint of hypotension and losartan was discontinued.  He was seen in follow-up on 09/01/2021 by Christen Bame, NP.  He was scheduled to have follow-up with pulmonology and was noted to have labile BP at home.  He was also experiencing dyspnea but with no CP and was felt not related to ischemia.  He was seen later on 12/01/2021 by Dr. Debara Pickett and reported doing well with no complaints of chest pain.  No medication changes were made at that time.  [name] [name] returned to [facility], ED with complaint of chest pain. His pain was described as nonradiating and associated with nausea.  Pain was 7 out of 10 and intermittent in nature resolved with nitro and oxycodone.  He was given the option of admission versus going home and he chose to go home.  Patient reported to cardiac  rehab on 12/19/2021 and during exercise developed nausea and burning chest pain.  Patient states he felt better after drinking ginger ale and was advised to follow-up with cardiologist.  Since last being seen in the office patient reports that he is been experiencing anginal symptoms with shortness of breath.  He is accompanied today by his wife who states that these complaints originally occurred after drinking cold water in the middle of the night.  EKG obtained today which revealed new second-degree AV block that is asymptomatic. Today he denies chest pain but does report pain yesterday evening that resolved with Pepto-Bismol.  Patient wife also states that he admits 3 doses of DAPT last month and has not taken his prescribed dose of Toprol-XL since his heart attack.  Patient denies chest pain, palpitations, dyspnea, PND, orthopnea, nausea, vomiting, dizziness, syncope, edema, weight gain, or early satiety.  Home Medications    Current Outpatient Medications  Medication Sig Dispense Refill   albuterol (VENTOLIN HFA) 108 (90 Base) MCG/ACT inhaler Inhale 2 puffs into the lungs every 6 (six) hours as needed for wheezing or shortness of breath. 18 g 11   aspirin EC 81 MG tablet Take 81 mg by mouth daily. Swallow whole.     budesonide-formoterol (SYMBICORT) 160-4.5 MCG/ACT inhaler Inhale 2 puffs into the lungs 2 (two) times daily. 1 each 6   calcitRIOL (ROCALTROL) 0.25 MCG capsule Take 0.25 mcg by mouth daily.     glucose blood test strip 1 each by Other route 2 (two) times daily. Use as instructed to check blood glucose twice daily 200 each 2   hydrocortisone (ANUSOL-HC) 2.5 % rectal cream Place 1 application rectally 2 (two) times daily. 30 g 1   Insulin Pen Needle (DROPLET PEN NEEDLES) 31G X 5 MM MISC Use to inject insulin daily as directed 150 each 2   LANTUS SOLOSTAR 100 UNIT/ML Solostar Pen INJECT  30 UNITS SUBCUTANEOUSLY AT BEDTIME (Patient taking differently: 30 Units at bedtime.) 30 mL 1    losartan (COZAAR) 25 MG tablet Take 1 tablet (25 mg total) by mouth daily. 30 tablet 6   metoprolol succinate (TOPROL-XL) 25 MG 24 hr tablet Take 1 tablet (25 mg total) by mouth daily. 30 tablet 1   nitroGLYCERIN (NITROSTAT) 0.4 MG SL tablet Place 1 tablet (0.4 mg  total) under the tongue every 5 (five) minutes as needed for chest pain. 30 tablet 1   pantoprazole (PROTONIX) 40 MG tablet Take 1 tablet (40 mg total) by mouth 2 (two) times daily before a meal. 60 tablet 5   simvastatin (ZOCOR) 20 MG tablet Take 20 mg by mouth every morning.     ticagrelor (BRILINTA) 90 MG TABS tablet Take 1 tablet (90 mg total) by mouth 2 (two) times daily. 60 tablet 3   torsemide (DEMADEX) 100 MG tablet Take 100 mg by mouth daily.     TRADJENTA 5 MG TABS tablet TAKE 1 TABLET EVERY DAY 90 tablet 1   Vitamin D, Ergocalciferol, (DRISDOL) 1.25 MG (50000 UNIT) CAPS capsule Take 50,000 Units by mouth every 7 (seven) days.     No current facility-administered medications for this visit.     Review of Systems  Please see the history of present illness.    (+) Chest pressure (+) Nausea  All other systems reviewed and are otherwise negative except as noted above.  Physical Exam    Wt Readings from Last 3 Encounters:  12/05/21 239 lb 10.2 oz (108.7 kg)  12/01/21 236 lb 12.8 oz (107.4 kg)  11/21/21 239 lb 3.2 oz (108.5 kg)   IZ:TIWPY were no vitals filed for this visit.,There is no height or weight on file to calculate BMI.  Constitutional:      Appearance: Healthy appearance. Not in distress.  Neck:     Vascular: JVD normal.  Pulmonary:     Effort: Pulmonary effort is normal.     Breath sounds: No wheezing. No rales. Diminished in the bases Cardiovascular:     Normal rate. Regular rhythm. Normal S1. Normal S2.      Murmurs: There is no murmur.  Edema:    Peripheral edema absent.  Abdominal:     Palpations: Abdomen is soft non tender. There is no hepatomegaly.  Skin:    General: Skin is warm and dry.   Neurological:     General: No focal deficit present.     Mental Status: Alert and oriented to person, place and time.     Cranial Nerves: Cranial nerves are intact.  EKG/LABS/Other Studies Reviewed    ECG personally reviewed by me today -sinus rhythm with second-degree AV block Mobitz type I with left axis deviation and left bundle branch block.   Lab Results  Component Value Date   WBC 6.6 12/14/2021   HGB 10.6 (L) 12/14/2021   HCT 32.8 (L) 12/14/2021   MCV 96.2 12/14/2021   PLT 166 12/14/2021   Lab Results  Component Value Date   CREATININE 18.41 (H) 12/14/2021   BUN 67 (H) 12/14/2021   NA 135 12/14/2021   K 3.7 12/14/2021   CL 98 12/14/2021   CO2 25 12/14/2021   Lab Results  Component Value Date   ALT 21 08/14/2021   AST 18 08/14/2021   ALKPHOS 92 08/14/2021   BILITOT 0.3 08/14/2021   Lab Results  Component Value Date   CHOL 99 11/23/2021   HDL 29 (L) 11/23/2021   LDLCALC 47 11/23/2021   TRIG 148 11/23/2021   CHOLHDL 3.4 11/23/2021    Lab Results  Component Value Date   HGBA1C 6.6 (H) 07/22/2021    Assessment & Plan    1.  Coronary artery disease: - s/p NSTEMI 07/2021 treated with DES x1 to proximal and mid RCA lesion. -Patient was recently seen in ED on May 31 and presents today with complaint  of unstable angina. -DOD consulted based on patient's presenting symptoms and new onset heart block  -Recommendation to repeat LHC to reevaluate coronary arteries due to complaint of unstable angina -Start Imdur 30 mg daily -Patient advised to seek care at nearest ED if symptoms recur and pain develops that is not relieved with sublingual nitroglycerin -Continue DAPT with Brilinta 90 mg twice daily and 81 mg ASA daily and no bleeding issues noted. -Continue GDMT with 81 mg ASA and Zocor 20 mg daily -Discontinue Toprol XL 25 mg due to new onset second-degree AV block type I -BMET and CBC today  2.  HFpEF: -2D echo completed 07/2021 with EF of 55-60% -Patient  appears euvolemic on examination today -Continue losartan 25 mg daily and torsemide 20 mg daily -Low sodium diet, fluid restriction <2L, and daily weights encouraged. Educated to contact our office for weight gain of 2 lbs overnight or 5 lbs in one week.   3.  Hyperlipidemia: -Continue Zocor 20 mg daily -Last LDL cholesterol was 47 on 11/23/2021 -Continue low-sodium heart healthy diet  4.  ESRD: -Patient performs peritoneal dialysis at home and is managed by nephrology  5. Essential hypertension: -Blood pressure well controlled at 128/70 -Continue current antihypertensive regimen as noted above -Discontinue Toprol-XL as noted above  6.  Obesity: -Current BMI is 39.31kg/m -Patient advised to reduce calorie intake and continue exercise routine after cardiac rehab is complete   Disposition: Follow-up with Pixie Casino, MD or APP in 1 months Shared Decision Making/Informed Consent The risks [stroke (1 in 1000), death (1 in 1000), kidney failure [usually temporary] (1 in 500), bleeding (1 in 200), allergic reaction [possibly serious] (1 in 200)], benefits (diagnostic support and management of coronary artery disease) and alternatives of a cardiac catheterization were discussed in detail with [name] [name] and he is willing to proceed.   Medication Adjustments/Labs and Tests Ordered: Current medicines are reviewed at length with the patient today.  Concerns regarding medicines are outlined above.   Signed, Mable Fill, Marissa Nestle, NP 12/21/2021, 12:10 PM [place]

## 2021-12-22 ENCOUNTER — Ambulatory Visit (INDEPENDENT_AMBULATORY_CARE_PROVIDER_SITE_OTHER): Payer: Medicare Other | Admitting: Nurse Practitioner

## 2021-12-22 ENCOUNTER — Other Ambulatory Visit: Payer: Self-pay | Admitting: Nurse Practitioner

## 2021-12-22 ENCOUNTER — Encounter: Payer: Self-pay | Admitting: Physician Assistant

## 2021-12-22 VITALS — BP 128/70 | HR 58 | Ht 64.0 in | Wt 229.0 lb

## 2021-12-22 DIAGNOSIS — Z992 Dependence on renal dialysis: Secondary | ICD-10-CM | POA: Diagnosis not present

## 2021-12-22 DIAGNOSIS — D509 Iron deficiency anemia, unspecified: Secondary | ICD-10-CM | POA: Diagnosis not present

## 2021-12-22 DIAGNOSIS — I25110 Atherosclerotic heart disease of native coronary artery with unstable angina pectoris: Secondary | ICD-10-CM

## 2021-12-22 DIAGNOSIS — D631 Anemia in chronic kidney disease: Secondary | ICD-10-CM | POA: Diagnosis not present

## 2021-12-22 DIAGNOSIS — I10 Essential (primary) hypertension: Secondary | ICD-10-CM | POA: Diagnosis not present

## 2021-12-22 DIAGNOSIS — N186 End stage renal disease: Secondary | ICD-10-CM

## 2021-12-22 DIAGNOSIS — I2510 Atherosclerotic heart disease of native coronary artery without angina pectoris: Secondary | ICD-10-CM

## 2021-12-22 DIAGNOSIS — E785 Hyperlipidemia, unspecified: Secondary | ICD-10-CM | POA: Diagnosis not present

## 2021-12-22 DIAGNOSIS — I5040 Unspecified combined systolic (congestive) and diastolic (congestive) heart failure: Secondary | ICD-10-CM

## 2021-12-22 DIAGNOSIS — E66812 Obesity, class 2: Secondary | ICD-10-CM

## 2021-12-22 DIAGNOSIS — Z6839 Body mass index (BMI) 39.0-39.9, adult: Secondary | ICD-10-CM

## 2021-12-22 DIAGNOSIS — N250 Renal osteodystrophy: Secondary | ICD-10-CM | POA: Diagnosis not present

## 2021-12-22 DIAGNOSIS — Z01818 Encounter for other preprocedural examination: Secondary | ICD-10-CM | POA: Diagnosis not present

## 2021-12-22 DIAGNOSIS — I2111 ST elevation (STEMI) myocardial infarction involving right coronary artery: Secondary | ICD-10-CM

## 2021-12-22 LAB — CBC
Hematocrit: 33.7 % — ABNORMAL LOW (ref 37.5–51.0)
Hemoglobin: 11.3 g/dL — ABNORMAL LOW (ref 13.0–17.7)
MCH: 30.4 pg (ref 26.6–33.0)
MCHC: 33.5 g/dL (ref 31.5–35.7)
MCV: 91 fL (ref 79–97)
Platelets: 117 10*3/uL — ABNORMAL LOW (ref 150–450)
RBC: 3.72 x10E6/uL — ABNORMAL LOW (ref 4.14–5.80)
RDW: 13.7 % (ref 11.6–15.4)
WBC: 6.1 10*3/uL (ref 3.4–10.8)

## 2021-12-22 LAB — BASIC METABOLIC PANEL
BUN/Creatinine Ratio: 3 — ABNORMAL LOW (ref 10–24)
BUN: 58 mg/dL — ABNORMAL HIGH (ref 8–27)
CO2: 28 mmol/L (ref 20–29)
Calcium: 10.5 mg/dL — ABNORMAL HIGH (ref 8.6–10.2)
Chloride: 92 mmol/L — ABNORMAL LOW (ref 96–106)
Creatinine, Ser: 16.87 mg/dL (ref 0.76–1.27)
Glucose: 117 mg/dL — ABNORMAL HIGH (ref 70–99)
Potassium: 4.6 mmol/L (ref 3.5–5.2)
Sodium: 136 mmol/L (ref 134–144)
eGFR: 3 mL/min/{1.73_m2} — ABNORMAL LOW (ref >59)

## 2021-12-22 MED ORDER — ISOSORBIDE MONONITRATE ER 30 MG PO TB24: 30.0000 mg | ORAL_TABLET | Freq: Every day | ORAL | 3 refills | Status: DC

## 2021-12-22 MED ORDER — SODIUM CHLORIDE 0.9% FLUSH: 3.0000 mL | Freq: Two times a day (BID) | INTRAVENOUS | Status: DC

## 2021-12-22 NOTE — Patient Instructions (Addendum)
Medication Instructions:  START Imdur 30 mg daily *If you need a refill on your cardiac medications before your next appointment, please call your pharmacy*  Lab Work: Your physician recommends that you return for lab work TODAY:  BMET CBC If you have labs (blood work) drawn today and your tests are completely normal, you will receive your results only by: [place] (if you have MyChart) OR A paper copy in the mail If you have any lab test that is abnormal or we need to change your treatment, we will call you to review the results.  Testing/Procedures: Your physician has requested that you have a cardiac catheterization. Cardiac catheterization is used to diagnose and/or treat various heart conditions. Doctors may recommend this procedure for a number of different reasons. The most common reason is to evaluate chest pain. Chest pain can be a symptom of coronary artery disease (CAD), and cardiac catheterization can show whether plaque is narrowing or blocking your heart's arteries. This procedure is also used to evaluate the valves, as well as measure the blood flow and oxygen levels in different parts of your heart. For further information please visit [email or web address]. Please follow instruction sheet, as given.  Scheduled for Monday 12/26/21 at 9:00 AM  Follow-Up: At [facility], you and your health needs are our priority.  As part of our continuing mission to provide you with exceptional heart care, we have created designated Provider Care Teams.  These Care Teams include your primary Cardiologist (physician) and Advanced Practice Providers (APPs -  Physician Assistants and Nurse Practitioners) who all work together to provide you with the care you need, when you need it.  We recommend signing up for the patient portal called "MyChart".  Sign up information is provided on this After Visit Summary.  MyChart is used to connect with patients for Virtual Visits (Telemedicine).   Patients are able to view lab/test results, encounter notes, upcoming appointments, etc.  Non-urgent messages can be sent to your provider as well.   To learn more about what you can do with MyChart, go to [email or web address].    Your next appointment:   2-3 week(s) post heart catheterization  The format for your next appointment:   In Person  Provider:   Pixie Casino, MD  or  APP         Other Instructions  [address] [place] [address] Dept: [phone] Loc: [place]  12/22/2021  You are scheduled for a Cardiac Catheterization on Monday, June 12 with Dr. Harrell Gave End.  1. Please arrive at the Main Entrance A at [facility]: [place], [place] [address] at 7:00 AM (This time is two hours before your procedure to ensure your preparation). Free valet parking service is available.   Special note: Every effort is made to have your procedure done on time. Please understand that emergencies sometimes delay scheduled procedures.  2. Diet: Do not eat solid foods after midnight.  You may have clear liquids until 5 AM upon the day of the procedure.  3. Labs: You will need to have blood drawn on Thursday, June 8 at [place]  Open: [place] (Lunch 12:30 - 1:30)   Phone: [phone]. You do not need to be fasting.  4. Medication instructions in preparation for your procedure:   Contrast Allergy: No   Stop taking, Cozaar (Losartan) Monday,  June 12, HOLD Torsemide the the day before Cath  Take only 15 units of insulin the night before your procedure. Do not take any insulin on the day of the procedure.    On the morning of your procedure, take Aspirin and any morning medicines NOT listed above.  You may use sips of water.  5. Plan to go home the same day, you will only stay overnight if  medically necessary. 6. You MUST have a responsible adult to drive you home. 7. An adult MUST be with you the first 24 hours after you arrive home. 8. Bring a current list of your medications, and the last time and date medication taken. 9. Bring ID and current insurance cards. 10.Please wear clothes that are easy to get on and off and wear slip-on shoes.  Thank you for allowing [place] to care for you!   -- [facility]   Important Information About [place]

## 2021-12-22 NOTE — Progress Notes (Signed)
Discharge Progress Report  Patient Details  Name: [name] [name] MRN: [id number] Date of Birth: 06-20-1951 Referring Provider:   Flowsheet Row CARDIAC REHAB PHASE II ORIENTATION from 09/29/2021 in [place]  Referring Provider Dr. Kurtis Bushman        Number of Visits: 29  Reason for Discharge: Early Exit: cardiac cath   Smoking History:  Social History   Tobacco Use  Smoking Status Former   Packs/day: 1.00   Years: 25.00   Total pack years: 25.00   Types: Cigarettes   Quit date: 03/27/2010   Years since quitting: 11.7  Smokeless Tobacco Never  Tobacco Comments   Quit x 7 years    Diagnosis:  S/P coronary artery stent placement  NSTEMI (non-ST elevated myocardial infarction) ([place])  S/P PTCA (percutaneous transluminal coronary angioplasty)  ADL [facility]:   Initial Exercise Prescription:  Initial Exercise Prescription - 09/29/21 1300       Date of Initial Exercise RX and Referring Provider   Date 09/29/21    Referring Provider Dr. Kurtis Bushman    Expected Discharge Date 12/23/21      Treadmill   MPH 1.3    Grade 0    Minutes 17      NuStep   Level 1    SPM 60    Minutes 22      Prescription Details   Frequency (times per week) 3    Duration Progress to 30 minutes of continuous aerobic without signs/symptoms of physical distress      Intensity   THRR 40-80% of Max Heartrate 60-119    Ratings of Perceived Exertion 11-13      Progression   Progression Continue progressive overload as per policy without signs/symptoms or physical distress.      Resistance Training   Training Prescription Yes    Weight 3    Reps 10-15             Discharge Exercise Prescription (Final Exercise Prescription Changes):  Exercise Prescription Changes - 12/05/21 1500       Response to Exercise   Blood Pressure (Admit) 148/90    Blood Pressure (Exercise) 142/82    Blood Pressure (Exit) 148/72    Heart Rate (Admit) 73 bpm    Heart Rate (Exercise) 109  bpm    Heart Rate (Exit) 82 bpm    Rating of Perceived Exertion (Exercise) 11    Duration Continue with 30 min of aerobic exercise without signs/symptoms of physical distress.    Intensity THRR unchanged      Progression   Progression Continue to progress workloads to maintain intensity without signs/symptoms of physical distress.      Resistance Training   Training Prescription Yes    Weight 5    Reps 10-15    Time 10 Minutes      Treadmill   MPH 1.5    Grade 6    Minutes 17    METs 3.39      NuStep   Level 5    SPM 74    Minutes 22    METs 2.42             Functional Capacity:  6 Minute Walk     Row Name 09/29/21 1339         6 Minute Walk   Phase Initial     Distance 1150 feet     Walk Time 6 minutes     # of Rest Breaks 0  MPH 2.17     METS 1.97     RPE 12     VO2 Peak 6.92     Symptoms No     Resting HR 65 bpm     Resting BP 118/72     Resting Oxygen Saturation  98 %     Exercise Oxygen Saturation  during 6 min walk 94 %     Max Ex. HR 108 bpm     Max Ex. BP 142/60     2 Minute Post BP 120/60              Psychological, QOL, Others - Outcomes: PHQ 2/9:    09/29/2021    2:10 PM 03/07/2018    9:54 AM 11/05/2017    9:20 AM 07/02/2017    9:13 AM 04/02/2017    9:32 AM  Depression screen PHQ 2/9  Decreased Interest 0 0 0 0 0  Down, Depressed, Hopeless 0  0 0 0  PHQ - 2 Score 0 0 0 0 0  Altered sleeping 0      Tired, decreased energy 3      Change in appetite 1      Feeling bad or failure about yourself  2      Trouble concentrating 1      Moving slowly or fidgety/restless 0      Suicidal thoughts 0      PHQ-9 Score 7      Difficult doing work/chores Somewhat difficult        Quality of Life:  Quality of Life - 09/29/21 1353       Quality of Life   Select Quality of Life      Quality of Life Scores   Health/Function Pre 24.73 %    Socioeconomic Pre 14.38 %    Psych/Spiritual Pre 28.29 %    Family Pre 27.6 %    GLOBAL Pre  25.77 %             Personal Goals: Goals established at orientation with interventions provided to work toward goal.  Personal Goals and Risk Factors at Admission - 09/29/21 1414       Core Components/Risk Factors/Patient Goals on Admission    Weight Management Obesity    Improve shortness of breath with ADL's Yes    Intervention Provide education, individualized exercise plan and daily activity instruction to help decrease symptoms of SOB with activities of daily living.    Expected Outcomes Short Term: Improve cardiorespiratory fitness to achieve a reduction of symptoms when performing ADLs;Long Term: Be able to perform more ADLs without symptoms or delay the onset of symptoms    Diabetes Yes    Intervention Provide education about signs/symptoms and action to take for hypo/hyperglycemia.;Provide education about proper nutrition, including hydration, and aerobic/resistive exercise prescription along with prescribed medications to achieve blood glucose in normal ranges: Fasting glucose 65-99 mg/dL    Expected Outcomes Short Term: Participant verbalizes understanding of the signs/symptoms and immediate care of hyper/hypoglycemia, proper foot care and importance of medication, aerobic/resistive exercise and nutrition plan for blood glucose control.;Long Term: Attainment of HbA1C < 7%.    Personal Goal Other Yes    Personal Goal Patient wants to lose 20 lbs in the program and more long term and get healthier overall.    Intervention Patient will attend the CR program 3 days/week with exercise and education and supplement with exercise at home.    Expected Outcomes Patient will complete the program meeting  both personal and program goals.              Personal Goals Discharge:  Goals and Risk Factor Review     Row Name 10/03/21 0916 10/31/21 1301 11/28/21 1315         Core Components/Risk Factors/Patient Goals Review   Personal Goals Review Weight  Management/Obesity;Diabetes;Improve shortness of breath with ADL's Weight Management/Obesity;Diabetes;Improve shortness of breath with ADL's Weight Management/Obesity;Diabetes;Improve shortness of breath with ADL's     Review Patient was referred to CR with DES/NSTEMI/PTCA. He has multiple risk factors for CAD and is participating in the program for risk modification. He plans to start today. His last A1C on file was 07/22/21 at 6.6%. He is on insulin for DM control. His personal goals for the program are to lose 20 lbs and get healthier overall. We will continue to monitor his progress as he works towards meeting these goals. Patient has completed 11 sessions. His current weight is 234.5 lbs losing 4.5 lbs since last 30 day review. He is doing well in the program with consistent attendance and progressions. His last A1C on file was 07/22/21 at 6.6%. He is on insulin for DM control. His blood pressure is above goal. We will contact his MD. He continues on pertineal dialysis.He saw Dr. Elsworth Soho for OSA. No changes made. His personal goals for the program continue to be to lose 20 lbs and get healthier overall. We will continue to monitor his progress as he works towards meeting these goals. Patient has completed 20 sessions. His current weight is 239.4 lbs gaining 4.9 lbs since last 30 day review. He continues to do well in the program with consistent attendance and progressions. His last A1C on file was 07/22/21 at 6.6%. He is on insulin for DM control. His blood pressure continues to be above goal. Patient has an appointment with Dr. Debara Pickett 5/18. We will send progress report and send Dr. Debara Pickett a note.  He continues on pertineal dialysis. His personal goals for the program continue to be to lose 20 lbs and get healthier overall. We will continue to monitor his progress as he works towards meeting these goals.     Expected Outcomes Patient will complete the program meeting both program and personal goals. Patient will  complete the program meeting both program and personal goals. Patient will complete the program meeting both program and personal goals.              Exercise Goals and Review:  Exercise Goals     Row Name 09/29/21 1350 10/10/21 1421 11/07/21 1241 12/05/21 1539       Exercise Goals   Increase Physical Activity Yes Yes Yes Yes    Intervention Provide advice, education, support and counseling about physical activity/exercise needs.;Develop an individualized exercise prescription for aerobic and resistive training based on initial evaluation findings, risk stratification, comorbidities and participant's personal goals. Provide advice, education, support and counseling about physical activity/exercise needs.;Develop an individualized exercise prescription for aerobic and resistive training based on initial evaluation findings, risk stratification, comorbidities and participant's personal goals. Provide advice, education, support and counseling about physical activity/exercise needs.;Develop an individualized exercise prescription for aerobic and resistive training based on initial evaluation findings, risk stratification, comorbidities and participant's personal goals. Provide advice, education, support and counseling about physical activity/exercise needs.;Develop an individualized exercise prescription for aerobic and resistive training based on initial evaluation findings, risk stratification, comorbidities and participant's personal goals.    Expected Outcomes Short Term: Attend rehab on  a regular basis to increase amount of physical activity.;Long Term: Add in home exercise to make exercise part of routine and to increase amount of physical activity.;Long Term: Exercising regularly at least 3-5 days a week. Short Term: Attend rehab on a regular basis to increase amount of physical activity.;Long Term: Add in home exercise to make exercise part of routine and to increase amount of physical  activity.;Long Term: Exercising regularly at least 3-5 days a week. Short Term: Attend rehab on a regular basis to increase amount of physical activity.;Long Term: Add in home exercise to make exercise part of routine and to increase amount of physical activity.;Long Term: Exercising regularly at least 3-5 days a week. Short Term: Attend rehab on a regular basis to increase amount of physical activity.;Long Term: Add in home exercise to make exercise part of routine and to increase amount of physical activity.;Long Term: Exercising regularly at least 3-5 days a week.    Increase Strength and Stamina Yes Yes Yes Yes    Intervention Provide advice, education, support and counseling about physical activity/exercise needs.;Develop an individualized exercise prescription for aerobic and resistive training based on initial evaluation findings, risk stratification, comorbidities and participant's personal goals. Provide advice, education, support and counseling about physical activity/exercise needs.;Develop an individualized exercise prescription for aerobic and resistive training based on initial evaluation findings, risk stratification, comorbidities and participant's personal goals. Provide advice, education, support and counseling about physical activity/exercise needs.;Develop an individualized exercise prescription for aerobic and resistive training based on initial evaluation findings, risk stratification, comorbidities and participant's personal goals. Provide advice, education, support and counseling about physical activity/exercise needs.;Develop an individualized exercise prescription for aerobic and resistive training based on initial evaluation findings, risk stratification, comorbidities and participant's personal goals.    Expected Outcomes Short Term: Increase workloads from initial exercise prescription for resistance, speed, and METs.;Short Term: Perform resistance training exercises routinely during  rehab and add in resistance training at home;Long Term: Improve cardiorespiratory fitness, muscular endurance and strength as measured by increased METs and functional capacity (6MWT) Short Term: Increase workloads from initial exercise prescription for resistance, speed, and METs.;Short Term: Perform resistance training exercises routinely during rehab and add in resistance training at home;Long Term: Improve cardiorespiratory fitness, muscular endurance and strength as measured by increased METs and functional capacity (6MWT) Short Term: Increase workloads from initial exercise prescription for resistance, speed, and METs.;Short Term: Perform resistance training exercises routinely during rehab and add in resistance training at home;Long Term: Improve cardiorespiratory fitness, muscular endurance and strength as measured by increased METs and functional capacity (6MWT) Short Term: Increase workloads from initial exercise prescription for resistance, speed, and METs.;Short Term: Perform resistance training exercises routinely during rehab and add in resistance training at home;Long Term: Improve cardiorespiratory fitness, muscular endurance and strength as measured by increased METs and functional capacity (6MWT)    Able to understand and use rate of perceived exertion (RPE) scale Yes Yes Yes Yes    Intervention Provide education and explanation on how to use RPE scale Provide education and explanation on how to use RPE scale Provide education and explanation on how to use RPE scale Provide education and explanation on how to use RPE scale    Expected Outcomes Short Term: Able to use RPE daily in rehab to express subjective intensity level;Long Term:  Able to use RPE to guide intensity level when exercising independently Short Term: Able to use RPE daily in rehab to express subjective intensity level;Long Term:  Able to use RPE to  guide intensity level when exercising independently Short Term: Able to use RPE  daily in rehab to express subjective intensity level;Long Term:  Able to use RPE to guide intensity level when exercising independently Short Term: Able to use RPE daily in rehab to express subjective intensity level;Long Term:  Able to use RPE to guide intensity level when exercising independently    Knowledge and understanding of Target Heart Rate Range (THRR) Yes Yes Yes Yes    Intervention Provide education and explanation of THRR including how the numbers were predicted and where they are located for reference Provide education and explanation of THRR including how the numbers were predicted and where they are located for reference Provide education and explanation of THRR including how the numbers were predicted and where they are located for reference Provide education and explanation of THRR including how the numbers were predicted and where they are located for reference    Expected Outcomes Short Term: Able to state/look up THRR;Long Term: Able to use THRR to govern intensity when exercising independently;Short Term: Able to use daily as guideline for intensity in rehab Short Term: Able to state/look up THRR;Long Term: Able to use THRR to govern intensity when exercising independently;Short Term: Able to use daily as guideline for intensity in rehab Short Term: Able to state/look up THRR;Long Term: Able to use THRR to govern intensity when exercising independently;Short Term: Able to use daily as guideline for intensity in rehab Short Term: Able to state/look up THRR;Long Term: Able to use THRR to govern intensity when exercising independently;Short Term: Able to use daily as guideline for intensity in rehab    Able to check pulse independently Yes Yes Yes Yes    Intervention Provide education and demonstration on how to check pulse in carotid and radial arteries.;Review the importance of being able to check your own pulse for safety during independent exercise Provide education and demonstration on  how to check pulse in carotid and radial arteries.;Review the importance of being able to check your own pulse for safety during independent exercise Provide education and demonstration on how to check pulse in carotid and radial arteries.;Review the importance of being able to check your own pulse for safety during independent exercise Provide education and demonstration on how to check pulse in carotid and radial arteries.;Review the importance of being able to check your own pulse for safety during independent exercise    Expected Outcomes Short Term: Able to explain why pulse checking is important during independent exercise;Long Term: Able to check pulse independently and accurately Short Term: Able to explain why pulse checking is important during independent exercise;Long Term: Able to check pulse independently and accurately Short Term: Able to explain why pulse checking is important during independent exercise;Long Term: Able to check pulse independently and accurately Short Term: Able to explain why pulse checking is important during independent exercise;Long Term: Able to check pulse independently and accurately    Understanding of Exercise Prescription Yes Yes Yes Yes    Intervention Provide education, explanation, and written materials on patient's individual exercise prescription Provide education, explanation, and written materials on patient's individual exercise prescription Provide education, explanation, and written materials on patient's individual exercise prescription Provide education, explanation, and written materials on patient's individual exercise prescription    Expected Outcomes Short Term: Able to explain program exercise prescription;Long Term: Able to explain home exercise prescription to exercise independently Short Term: Able to explain program exercise prescription;Long Term: Able to explain home exercise prescription to exercise independently  Short Term: Able to explain  program exercise prescription;Long Term: Able to explain home exercise prescription to exercise independently Short Term: Able to explain program exercise prescription;Long Term: Able to explain home exercise prescription to exercise independently             Exercise Goals Re-Evaluation:  Exercise Goals Re-Evaluation     Row Name 10/10/21 1421 11/07/21 1242 12/05/21 1543         Exercise Goal Re-Evaluation   Exercise Goals Review Increase Physical Activity;Increase Strength and Stamina;Able to understand and use rate of perceived exertion (RPE) scale;Knowledge and understanding of Target Heart Rate Range (THRR);Able to check pulse independently;Understanding of Exercise Prescription Increase Physical Activity;Increase Strength and Stamina;Able to understand and use rate of perceived exertion (RPE) scale;Knowledge and understanding of Target Heart Rate Range (THRR);Able to check pulse independently;Understanding of Exercise Prescription Increase Physical Activity;Increase Strength and Stamina;Able to understand and use rate of perceived exertion (RPE) scale;Knowledge and understanding of Target Heart Rate Range (THRR);Able to check pulse independently;Understanding of Exercise Prescription     Comments Pt has completed 5 sessions of cardiac rehab. He is progressing while in class and increasing his workload. He is motiviated while in class. He is currently exercising at 2.30 METs on the treadmill. Will continue to monitor and progress as able. Pt has completed 14 sessions of cardiac rehab. He continues to be motivated while in class and is progressing his workload. He is curretnly exercising at 2.53 METs on the treadmill. Will continue to monitor and progress as able. Pt has completed 24 sessions of cardiac rehab. He continues to be motivated while in class and continues to progress his workload. He is having high BP reading at rest and during exercise. He is currently exercising at 3.39 METs on the  treadmill. Will continue to monitor and progress as able.     Expected Outcomes Through exercise at home and at rehab, the patient will meet their stated goals. Through exercise at home and at rehab, the patient will meet their stated goals. Through exercise at home and at rehab, the patient will meet their stated goals.              Nutrition & Weight - Outcomes:  Pre Biometrics - 09/29/21 1352       Pre Biometrics   Height '5\' 4"'$  (1.626 m)    Weight 238 lb 15.7 oz (108.4 kg)    Waist Circumference 51 inches    Hip Circumference 44.5 inches    Waist to Hip Ratio 1.15 %    BMI (Calculated) 41    Triceps Skinfold 16 mm    % Body Fat 38.9 %    Grip Strength 37.4 kg    Flexibility 7 in    Single Leg Stand 10 seconds              Nutrition:  Nutrition Therapy & Goals - 09/29/21 1412       Personal Nutrition Goals   Comments Patient scored 48 on his diet assessment. Handout provided and explained regarding DM and healthier choices. Patient verbalized understanding. He says he follows a low sugar, NA, and renal diet. We offer 2 educational sessions on heart healthy nutrition with handouts and assistance with RD referral if patient is interested.      Intervention Plan   Intervention Nutrition handout(s) given to patient.    Expected Outcomes Short Term Goal: Understand basic principles of dietary content, such as calories, fat, sodium, cholesterol and nutrients.  Nutrition Discharge:  Nutrition Assessments - 09/29/21 1413       MEDFICTS Scores   Pre Score 48             Education Questionnaire Score:  Knowledge Questionnaire Score - 09/29/21 1413       Knowledge Questionnaire Score   Pre Score 20/24             Pt discharged from CR after 29 sessions. He has had several anginal episodes over the past month. During his last session of CR, on 12/19/2021, he was sent home due to angina during exercise. Cardiology has elected to do another  cardiac catherization on 12/26/2021. I spoke with Jenny Reichmann, and I informed him that we would be discharging him from the program at this time. I informed him that if he receives another stent, he will get a new CR referral, and we will start him over. I also informed him on our forever fit maintenance program incase he does not receive a new referral and he still wants to exercise in our facility. He was agreeable to this plan.

## 2021-12-23 ENCOUNTER — Encounter (HOSPITAL_COMMUNITY): Payer: Medicare Other

## 2021-12-23 DIAGNOSIS — Z992 Dependence on renal dialysis: Secondary | ICD-10-CM | POA: Diagnosis not present

## 2021-12-23 DIAGNOSIS — D631 Anemia in chronic kidney disease: Secondary | ICD-10-CM | POA: Diagnosis not present

## 2021-12-23 DIAGNOSIS — N250 Renal osteodystrophy: Secondary | ICD-10-CM | POA: Diagnosis not present

## 2021-12-23 DIAGNOSIS — D509 Iron deficiency anemia, unspecified: Secondary | ICD-10-CM | POA: Diagnosis not present

## 2021-12-23 DIAGNOSIS — N186 End stage renal disease: Secondary | ICD-10-CM | POA: Diagnosis not present

## 2021-12-24 DIAGNOSIS — Z992 Dependence on renal dialysis: Secondary | ICD-10-CM | POA: Diagnosis not present

## 2021-12-24 DIAGNOSIS — N250 Renal osteodystrophy: Secondary | ICD-10-CM | POA: Diagnosis not present

## 2021-12-24 DIAGNOSIS — N186 End stage renal disease: Secondary | ICD-10-CM | POA: Diagnosis not present

## 2021-12-24 DIAGNOSIS — D631 Anemia in chronic kidney disease: Secondary | ICD-10-CM | POA: Diagnosis not present

## 2021-12-24 DIAGNOSIS — D509 Iron deficiency anemia, unspecified: Secondary | ICD-10-CM | POA: Diagnosis not present

## 2021-12-25 DIAGNOSIS — Z992 Dependence on renal dialysis: Secondary | ICD-10-CM | POA: Diagnosis not present

## 2021-12-25 DIAGNOSIS — N186 End stage renal disease: Secondary | ICD-10-CM | POA: Diagnosis not present

## 2021-12-25 DIAGNOSIS — D509 Iron deficiency anemia, unspecified: Secondary | ICD-10-CM | POA: Diagnosis not present

## 2021-12-25 DIAGNOSIS — D631 Anemia in chronic kidney disease: Secondary | ICD-10-CM | POA: Diagnosis not present

## 2021-12-25 DIAGNOSIS — N250 Renal osteodystrophy: Secondary | ICD-10-CM | POA: Diagnosis not present

## 2021-12-26 ENCOUNTER — Encounter (HOSPITAL_COMMUNITY): Payer: Self-pay | Admitting: Internal Medicine

## 2021-12-26 ENCOUNTER — Other Ambulatory Visit: Payer: Self-pay

## 2021-12-26 ENCOUNTER — Observation Stay (HOSPITAL_COMMUNITY)
Admission: RE | Admit: 2021-12-26 | Discharge: 2021-12-27 | Disposition: A | Discharge status: Home / Self Care | Payer: Medicare Other | Attending: Internal Medicine | Admitting: Internal Medicine

## 2021-12-26 ENCOUNTER — Encounter (HOSPITAL_COMMUNITY)
Admission: RE | Disposition: A | Discharge status: Home / Self Care | Payer: Self-pay | Source: Home / Self Care | Attending: Internal Medicine

## 2021-12-26 DIAGNOSIS — I132 Hypertensive heart and chronic kidney disease with heart failure and with stage 5 chronic kidney disease, or end stage renal disease: Secondary | ICD-10-CM | POA: Diagnosis not present

## 2021-12-26 DIAGNOSIS — I5030 Unspecified diastolic (congestive) heart failure: Secondary | ICD-10-CM | POA: Diagnosis not present

## 2021-12-26 DIAGNOSIS — E1122 Type 2 diabetes mellitus with diabetic chronic kidney disease: Secondary | ICD-10-CM | POA: Insufficient documentation

## 2021-12-26 DIAGNOSIS — I441 Atrioventricular block, second degree: Secondary | ICD-10-CM

## 2021-12-26 DIAGNOSIS — Z794 Long term (current) use of insulin: Secondary | ICD-10-CM | POA: Insufficient documentation

## 2021-12-26 DIAGNOSIS — N186 End stage renal disease: Secondary | ICD-10-CM | POA: Diagnosis not present

## 2021-12-26 DIAGNOSIS — D631 Anemia in chronic kidney disease: Secondary | ICD-10-CM | POA: Diagnosis not present

## 2021-12-26 DIAGNOSIS — J449 Chronic obstructive pulmonary disease, unspecified: Secondary | ICD-10-CM | POA: Diagnosis not present

## 2021-12-26 DIAGNOSIS — I2510 Atherosclerotic heart disease of native coronary artery without angina pectoris: Secondary | ICD-10-CM | POA: Diagnosis not present

## 2021-12-26 DIAGNOSIS — I10 Essential (primary) hypertension: Secondary | ICD-10-CM | POA: Diagnosis present

## 2021-12-26 DIAGNOSIS — R0602 Shortness of breath: Secondary | ICD-10-CM | POA: Diagnosis present

## 2021-12-26 DIAGNOSIS — Z992 Dependence on renal dialysis: Secondary | ICD-10-CM | POA: Diagnosis not present

## 2021-12-26 DIAGNOSIS — I25110 Atherosclerotic heart disease of native coronary artery with unstable angina pectoris: Secondary | ICD-10-CM | POA: Diagnosis not present

## 2021-12-26 DIAGNOSIS — I214 Non-ST elevation (NSTEMI) myocardial infarction: Secondary | ICD-10-CM

## 2021-12-26 DIAGNOSIS — I252 Old myocardial infarction: Secondary | ICD-10-CM | POA: Diagnosis not present

## 2021-12-26 DIAGNOSIS — J45909 Unspecified asthma, uncomplicated: Secondary | ICD-10-CM | POA: Insufficient documentation

## 2021-12-26 DIAGNOSIS — N250 Renal osteodystrophy: Secondary | ICD-10-CM | POA: Diagnosis not present

## 2021-12-26 DIAGNOSIS — Z79899 Other long term (current) drug therapy: Secondary | ICD-10-CM | POA: Diagnosis not present

## 2021-12-26 DIAGNOSIS — I1311 Hypertensive heart and chronic kidney disease without heart failure, with stage 5 chronic kidney disease, or end stage renal disease: Secondary | ICD-10-CM | POA: Diagnosis not present

## 2021-12-26 DIAGNOSIS — Z7982 Long term (current) use of aspirin: Secondary | ICD-10-CM | POA: Insufficient documentation

## 2021-12-26 DIAGNOSIS — D509 Iron deficiency anemia, unspecified: Secondary | ICD-10-CM | POA: Diagnosis not present

## 2021-12-26 DIAGNOSIS — I200 Unstable angina: Secondary | ICD-10-CM

## 2021-12-26 DIAGNOSIS — Z955 Presence of coronary angioplasty implant and graft: Secondary | ICD-10-CM | POA: Diagnosis not present

## 2021-12-26 DIAGNOSIS — R079 Chest pain, unspecified: Secondary | ICD-10-CM | POA: Diagnosis not present

## 2021-12-26 DIAGNOSIS — E785 Hyperlipidemia, unspecified: Secondary | ICD-10-CM | POA: Diagnosis present

## 2021-12-26 HISTORY — DX: End stage renal disease: N18.6

## 2021-12-26 HISTORY — DX: Dependence on renal dialysis: Z99.2

## 2021-12-26 HISTORY — PX: CORONARY STENT INTERVENTION: CATH118234

## 2021-12-26 HISTORY — PX: LEFT HEART CATH AND CORONARY ANGIOGRAPHY: CATH118249

## 2021-12-26 LAB — GLUCOSE, CAPILLARY
Glucose-Capillary: 124 mg/dL — ABNORMAL HIGH (ref 70–99)
Glucose-Capillary: 120 mg/dL — ABNORMAL HIGH (ref 70–99)
Glucose-Capillary: 124 mg/dL — ABNORMAL HIGH (ref 70–99)
Glucose-Capillary: 124 mg/dL — ABNORMAL HIGH (ref 70–99)

## 2021-12-26 LAB — POCT ACTIVATED CLOTTING TIME
Activated Clotting Time: 287 seconds
Activated Clotting Time: 263 seconds
Activated Clotting Time: 275 seconds
Activated Clotting Time: 209 seconds
Activated Clotting Time: 191 seconds
Activated Clotting Time: 179 seconds

## 2021-12-26 SURGERY — LEFT HEART CATH AND CORONARY ANGIOGRAPHY
Anesthesia: LOCAL

## 2021-12-26 MED ORDER — SODIUM CHLORIDE 0.9 % IV SOLN: 250.0000 mL | INTRAVENOUS | Status: DC | PRN

## 2021-12-26 MED ORDER — FENTANYL CITRATE (PF) 100 MCG/2ML IJ SOLN
INTRAMUSCULAR | Status: AC
  Filled 2021-12-26: qty 2

## 2021-12-26 MED ORDER — ACETAMINOPHEN 325 MG PO TABS
ORAL_TABLET | ORAL | Status: AC
  Filled 2021-12-26: qty 2

## 2021-12-26 MED ORDER — MIDAZOLAM HCL 2 MG/2ML IJ SOLN
INTRAMUSCULAR | Status: AC
  Filled 2021-12-26: qty 2

## 2021-12-26 MED ORDER — GENTAMICIN SULFATE 0.1 % EX CREA
1.0000 "application " | TOPICAL_CREAM | Freq: Every day | CUTANEOUS | Status: DC
  Filled 2021-12-26: qty 15

## 2021-12-26 MED ORDER — LORAZEPAM 0.5 MG PO TABS: 0.5000 mg | ORAL_TABLET | Freq: Four times a day (QID) | ORAL | Status: DC | PRN

## 2021-12-26 MED ORDER — SODIUM CHLORIDE 0.9% FLUSH: 3.0000 mL | INTRAVENOUS | Status: DC | PRN

## 2021-12-26 MED ORDER — IOHEXOL 350 MG/ML SOLN
INTRAVENOUS | Status: DC | PRN
  Administered 2021-12-26: 170 mL

## 2021-12-26 MED ORDER — HEPARIN SODIUM (PORCINE) 1000 UNIT/ML IJ SOLN
INTRAMUSCULAR | Status: AC
Start: 2021-12-26 — End: ?
  Filled 2021-12-26: qty 10

## 2021-12-26 MED ORDER — INSULIN GLARGINE-YFGN 100 UNIT/ML ~~LOC~~ SOLN
30.0000 [IU] | Freq: Every day | SUBCUTANEOUS | Status: DC
  Administered 2021-12-26: 30 [IU] via SUBCUTANEOUS
  Filled 2021-12-26 (×2): qty 0.3

## 2021-12-26 MED ORDER — SODIUM CHLORIDE 0.9% FLUSH: 3.0000 mL | Freq: Two times a day (BID) | INTRAVENOUS | Status: DC

## 2021-12-26 MED ORDER — FENTANYL CITRATE (PF) 100 MCG/2ML IJ SOLN
INTRAMUSCULAR | Status: DC | PRN
  Administered 2021-12-26: 12.5 ug via INTRAVENOUS
  Administered 2021-12-26: 25 ug via INTRAVENOUS
  Administered 2021-12-26: 12.5 ug via INTRAVENOUS
  Administered 2021-12-26: 25 ug via INTRAVENOUS

## 2021-12-26 MED ORDER — ALUM & MAG HYDROXIDE-SIMETH 200-200-20 MG/5ML PO SUSP
30.0000 mL | Freq: Once | ORAL | Status: AC
Start: 2021-12-26 — End: 2021-12-26
  Administered 2021-12-26: 30 mL via ORAL
  Filled 2021-12-26: qty 30

## 2021-12-26 MED ORDER — SODIUM CHLORIDE 0.9 % IV SOLN
250.0000 mL | INTRAVENOUS | Status: DC | PRN
Start: 2021-12-26 — End: 2021-12-27

## 2021-12-26 MED ORDER — ACETAMINOPHEN 325 MG PO TABS
650.0000 mg | ORAL_TABLET | ORAL | Status: DC | PRN
  Administered 2021-12-26: 650 mg via ORAL

## 2021-12-26 MED ORDER — HEPARIN (PORCINE) IN NACL 1000-0.9 UT/500ML-% IV SOLN
INTRAVENOUS | Status: AC
Start: 2021-12-26 — End: ?
  Filled 2021-12-26: qty 1000

## 2021-12-26 MED ORDER — NITROGLYCERIN 0.4 MG SL SUBL
0.4000 mg | SUBLINGUAL_TABLET | SUBLINGUAL | Status: DC | PRN
Start: 2021-12-26 — End: 2021-12-27
  Administered 2021-12-26 (×4): 0.4 mg via SUBLINGUAL
  Filled 2021-12-26 (×2): qty 1

## 2021-12-26 MED ORDER — ISOSORBIDE MONONITRATE ER 30 MG PO TB24
30.0000 mg | ORAL_TABLET | Freq: Every day | ORAL | Status: DC
Start: 2021-12-27 — End: 2021-12-27
  Administered 2021-12-27: 30 mg via ORAL
  Filled 2021-12-26: qty 1

## 2021-12-26 MED ORDER — LIDOCAINE HCL (PF) 1 % IJ SOLN
INTRAMUSCULAR | Status: DC | PRN
  Administered 2021-12-26: 15 mL

## 2021-12-26 MED ORDER — TORSEMIDE 20 MG PO TABS
100.0000 mg | ORAL_TABLET | Freq: Every day | ORAL | Status: DC
Start: 2021-12-27 — End: 2021-12-27
  Administered 2021-12-27: 100 mg via ORAL
  Filled 2021-12-26: qty 5

## 2021-12-26 MED ORDER — ALBUTEROL SULFATE (2.5 MG/3ML) 0.083% IN NEBU
3.0000 mL | INHALATION_SOLUTION | Freq: Four times a day (QID) | RESPIRATORY_TRACT | Status: DC | PRN
Start: 2021-12-26 — End: 2021-12-27

## 2021-12-26 MED ORDER — SODIUM CHLORIDE 0.9 % IV SOLN: INTRAVENOUS | Status: DC

## 2021-12-26 MED ORDER — NITROGLYCERIN 1 MG/10 ML FOR IR/CATH LAB
INTRA_ARTERIAL | Status: DC | PRN
  Administered 2021-12-26: 200 ug via INTRACORONARY

## 2021-12-26 MED ORDER — ASPIRIN 81 MG PO CHEW: 81.0000 mg | CHEWABLE_TABLET | ORAL | Status: DC

## 2021-12-26 MED ORDER — CALCITRIOL 0.25 MCG PO CAPS
0.2500 ug | ORAL_CAPSULE | Freq: Every day | ORAL | Status: DC
Start: 2021-12-27 — End: 2021-12-27
  Administered 2021-12-27: 0.25 ug via ORAL
  Filled 2021-12-26: qty 1

## 2021-12-26 MED ORDER — DELFLEX-LC/2.5% DEXTROSE 394 MOSM/L IP SOLN: INTRAPERITONEAL | Status: DC

## 2021-12-26 MED ORDER — HEPARIN SODIUM (PORCINE) 5000 UNIT/ML IJ SOLN
5000.0000 [IU] | Freq: Three times a day (TID) | INTRAMUSCULAR | Status: DC
  Filled 2021-12-26 (×2): qty 1

## 2021-12-26 MED ORDER — MOMETASONE FURO-FORMOTEROL FUM 200-5 MCG/ACT IN AERO
2.0000 | INHALATION_SPRAY | Freq: Two times a day (BID) | RESPIRATORY_TRACT | Status: DC
  Filled 2021-12-26: qty 8.8

## 2021-12-26 MED ORDER — HYDRALAZINE HCL 20 MG/ML IJ SOLN
10.0000 mg | INTRAMUSCULAR | Status: AC | PRN
Start: 2021-12-26 — End: 2021-12-26
  Administered 2021-12-26: 10 mg via INTRAVENOUS
  Filled 2021-12-26: qty 1

## 2021-12-26 MED ORDER — HEPARIN SODIUM (PORCINE) 1000 UNIT/ML IJ SOLN
INTRAMUSCULAR | Status: AC
  Filled 2021-12-26: qty 10

## 2021-12-26 MED ORDER — INSULIN ASPART 100 UNIT/ML IJ SOLN: 0.0000 [IU] | Freq: Three times a day (TID) | INTRAMUSCULAR | Status: DC

## 2021-12-26 MED ORDER — LINAGLIPTIN 5 MG PO TABS
5.0000 mg | ORAL_TABLET | Freq: Every day | ORAL | Status: DC
Start: 2021-12-27 — End: 2021-12-27
  Administered 2021-12-27: 5 mg via ORAL
  Filled 2021-12-26: qty 1

## 2021-12-26 MED ORDER — MIDAZOLAM HCL 2 MG/2ML IJ SOLN
INTRAMUSCULAR | Status: DC | PRN
  Administered 2021-12-26: 1 mg via INTRAVENOUS

## 2021-12-26 MED ORDER — LIDOCAINE HCL (PF) 1 % IJ SOLN
INTRAMUSCULAR | Status: AC
  Filled 2021-12-26: qty 30

## 2021-12-26 MED ORDER — HEPARIN (PORCINE) IN NACL 1000-0.9 UT/500ML-% IV SOLN
INTRAVENOUS | Status: DC | PRN
  Administered 2021-12-26 (×2): 500 mL

## 2021-12-26 MED ORDER — NITROGLYCERIN 1 MG/10 ML FOR IR/CATH LAB
INTRA_ARTERIAL | Status: AC
  Filled 2021-12-26: qty 10

## 2021-12-26 MED ORDER — LOSARTAN POTASSIUM 25 MG PO TABS
25.0000 mg | ORAL_TABLET | Freq: Every day | ORAL | Status: DC
  Administered 2021-12-27: 25 mg via ORAL
  Filled 2021-12-26: qty 1

## 2021-12-26 MED ORDER — DELFLEX-LC/1.5% DEXTROSE 344 MOSM/L IP SOLN: INTRAPERITONEAL | Status: DC

## 2021-12-26 MED ORDER — PANTOPRAZOLE SODIUM 40 MG PO TBEC
40.0000 mg | DELAYED_RELEASE_TABLET | Freq: Two times a day (BID) | ORAL | Status: DC
  Administered 2021-12-26 – 2021-12-27 (×2): 40 mg via ORAL
  Filled 2021-12-26 (×2): qty 1

## 2021-12-26 MED ORDER — SEVELAMER CARBONATE 800 MG PO TABS
800.0000 mg | ORAL_TABLET | Freq: Three times a day (TID) | ORAL | Status: DC
  Administered 2021-12-26 – 2021-12-27 (×2): 800 mg via ORAL
  Filled 2021-12-26 (×2): qty 1

## 2021-12-26 MED ORDER — ASPIRIN 81 MG PO TBEC
81.0000 mg | DELAYED_RELEASE_TABLET | Freq: Every day | ORAL | Status: DC
Start: 2021-12-27 — End: 2021-12-27
  Administered 2021-12-27: 81 mg via ORAL
  Filled 2021-12-26: qty 1

## 2021-12-26 MED ORDER — SIMVASTATIN 20 MG PO TABS
20.0000 mg | ORAL_TABLET | Freq: Every morning | ORAL | Status: DC
Start: 2021-12-27 — End: 2021-12-27
  Administered 2021-12-27: 20 mg via ORAL
  Filled 2021-12-26: qty 1

## 2021-12-26 MED ORDER — HEPARIN SODIUM (PORCINE) 1000 UNIT/ML IJ SOLN
INTRAMUSCULAR | Status: DC | PRN
  Administered 2021-12-26: 9000 [IU] via INTRAVENOUS
  Administered 2021-12-26: 2000 [IU] via INTRAVENOUS

## 2021-12-26 MED ORDER — ONDANSETRON HCL 4 MG/2ML IJ SOLN: 4.0000 mg | Freq: Four times a day (QID) | INTRAMUSCULAR | Status: DC | PRN

## 2021-12-26 MED ORDER — TICAGRELOR 90 MG PO TABS
90.0000 mg | ORAL_TABLET | Freq: Two times a day (BID) | ORAL | Status: DC
Start: 2021-12-26 — End: 2021-12-27
  Administered 2021-12-26 – 2021-12-27 (×2): 90 mg via ORAL
  Filled 2021-12-26 (×2): qty 1

## 2021-12-26 SURGICAL SUPPLY — 23 items
BALL SAPPHIRE NC24 4.0X15 (BALLOONS) ×2
BALLN EUPHORA RX 2.5X12 (BALLOONS) ×2
BALLOON EUPHORA RX 2.5X12 (BALLOONS) IMPLANT
BALLOON SAPPHIRE NC24 4.0X15 (BALLOONS) IMPLANT
CATH INFINITI 5FR JL5 (CATHETERS) ×1 IMPLANT
CATH INFINITI 5FR MULTPACK ANG (CATHETERS) ×1 IMPLANT
CATH LAUNCHER 6FR AL1 (CATHETERS) IMPLANT
CATHETER LAUNCHER 6FR AL1 (CATHETERS) ×2
ELECT DEFIB PAD ADLT CADENCE (PAD) ×1 IMPLANT
KIT ENCORE 26 ADVANTAGE (KITS) ×1 IMPLANT
KIT HEART LEFT (KITS) ×3 IMPLANT
KIT MICROPUNCTURE NIT STIFF (SHEATH) ×1 IMPLANT
PACK CARDIAC CATHETERIZATION (CUSTOM PROCEDURE TRAY) ×3 IMPLANT
SHEATH PINNACLE 5F 10CM (SHEATH) ×1 IMPLANT
SHEATH PINNACLE 6F 10CM (SHEATH) ×1 IMPLANT
SHEATH PROBE COVER 6X72 (BAG) ×1 IMPLANT
STENT ONYX FRONTIER 3.5X18 (Permanent Stent) ×1 IMPLANT
STENT ONYX FRONTIER 3.5X26 (Permanent Stent) ×1 IMPLANT
TRANSDUCER W/STOPCOCK (MISCELLANEOUS) ×3 IMPLANT
TUBING CIL FLEX 10 FLL-RA (TUBING) ×3 IMPLANT
WIRE ASAHI GRAND SLAM 180CM (WIRE) ×1 IMPLANT
WIRE EMERALD 3MM-J .035X150CM (WIRE) ×1 IMPLANT
WIRE RUNTHROUGH .014X180CM (WIRE) ×1 IMPLANT

## 2021-12-26 NOTE — Interval H&P Note (Signed)
History and Physical Interval Note:  12/26/2021 7:36 AM  [name] [name]  has presented today for surgery, with the diagnosis of increased angina with shortness of breath.  The various methods of treatment have been discussed with the patient and family. After consideration of risks, benefits and other options for treatment, the patient has consented to  Procedure(s): LEFT HEART CATH AND CORONARY ANGIOGRAPHY (N/A) as a surgical intervention.  The patient's history has been reviewed, patient examined, no change in status, stable for surgery.  I have reviewed the patient's chart and labs.  Questions were answered to the patient's satisfaction.    Cath Lab Visit (complete for each Cath Lab visit)  Clinical Evaluation Leading to the Procedure:   ACS: No.  Non-ACS:    Anginal Classification: CCS IV  Anti-ischemic medical therapy: Minimal Therapy (1 class of medications)  Non-Invasive Test Results: No non-invasive testing performed  Prior CABG: No previous CABG  [name] [name]

## 2021-12-26 NOTE — Progress Notes (Signed)
   Pt has mid sternal chest discomfort. Has had since procedure.  Different from prior angina. His BP is elevated so will give NTG to see if it will improve.  EKG without acute ST changes.  He is in [place] 1 heart block, though asymptomatic. He has hx of this as well.  His BB was stopped but HR is 71.    Initially NTG seemed to help but then returned the pain. Will try GI cocktail.

## 2021-12-26 NOTE — Progress Notes (Signed)
ACt 209. Site level 0  Bilateral dp and pt pulses palpable.

## 2021-12-26 NOTE — Progress Notes (Signed)
Site area: Right groin a 6 french arterial sheath was removed  Site Prior to Removal:  Level 0  Pressure Applied For 20 MINUTES    Bedrest Beginning at 1500 X 4 hours  Manual:   Yes.    Patient Status During Pull:  stable  Post Pull Groin Site:  Level 0  Post Pull Instructions Given:  Yes.    Post Pull Pulses Present:  Yes.    Dressing Applied:  Yes.    Comments:

## 2021-12-26 NOTE — Progress Notes (Signed)
Patient transferred from cath lab at 1530hrs.  Right femoral site CDI, level zero, +2 pedal pulse.  Oriented to unit and plan of care for shift.  Patient verbalized understanding.

## 2021-12-26 NOTE — Progress Notes (Signed)
Patient c/o 5/10 chest pain, states center of chest, non-radiating.  Describes as dull ache in center of chest.  Skin warm and dry.  EKG done.  Mickel Baas NP here to see patient.

## 2021-12-26 NOTE — Consult Note (Signed)
Renal Service Consult Note [facility]  [name] [name] 12/26/2021 Sol Blazing, MD Requesting Physician: Dr. Saunders Revel, C.   Reason for Consult: ESRD on CCPD w/ nstemi now sp PCI HPI: The patient is a 71 y.o. year-old  w/ hx of anemia, CAD, ESRD on PD in [place] ([facility]), COPD, HTN, gout, HL, OSA, home O2, cocaine abuse, PUD who presented to ED last night w/ 8/10 chest pain off and on for about 24 hrs. CP similar to his heart attack in Jan 2023 when he had PCI to RCA x 3 (1 stent and 2 ptca). In ED here he re'cd sl ntg, oxycodone for persistent pain. Cardiology consulted and went for LHC this am. We are asked to see pt for ESRD.    Pt seen in room. If f/b Dr Juleen China at [facility]. On dialysis about 4 yrs, didn't short stint of HD w/ AVF which failed, then a TDC.  No hx peritonitis. Uses about 1/2 greens and 1/2 yellow bags. He does a last fill. He doesn't know the volumes. No issues w/ his PD. He lives at home w/ his wife who is trained as well. He usually does it himself.   ROS - denies CP, no joint pain, no HA, no blurry vision, no rash, no diarrhea, no nausea/ vomiting   Past Medical History  Past Medical History:  Diagnosis Date   Anemia    Arthritis    Asthma    Bell palsy    CAD (coronary artery disease)    a. 2014 MV: abnl w/ infap ischemia; b. 03/2013 Cath: aneurysmal bleb in the LAD w/ otw nonobs dzs-->Med Rx.   Chronic back pain    Chronic knee pain    a. 09/2015 s/p R TKA.   Chronic pain    Chronic shoulder pain    Chronic sinusitis    CKD (chronic kidney disease), stage IV (HCC)    stage 4 per office visit note of Dr Lowanda Foster on 05/2015    COPD (chronic obstructive pulmonary disease) ([place])    Diabetes mellitus without complication ([place])    type II    Essential hypertension    GERD (gastroesophageal reflux disease)    Gout    Gout    Hepatomegaly    noted on noncontrast CT 2015   History of hiatal hernia    Hyperlipidemia    Lateral meniscus  tear    Obesity    Truncal   Obstructive sleep apnea    does not use cpap    On home oxygen therapy    uses 2l when is going somewhere per patient    PUD (peptic ulcer disease)    remote, reports f/u EGD about 8 years ago unremarkable    Reactive airway disease    related to exposure to chemical during 9/11   Renal insufficiency    Sinusitis    Vitamin D deficiency    Past Surgical History  Past Surgical History:  Procedure Laterality Date   ASAD LT SHOULDER  12/15/2008   left shoulder   AV FISTULA PLACEMENT Left 08/09/2016   Procedure: BRACHIOCEPHALIC ARTERIOVENOUS (AV) FISTULA CREATION LEFT ARM;  Surgeon: [name], MD;  Location: [facility];  Service: Vascular;  Laterality: Left;   CAPD INSERTION N/A 10/07/2018   Procedure: LAPAROSCOPIC PERITONEAL CATHETER PLACEMENT;  Surgeon: Mickeal Skinner, MD;  Location: [facility];  Service: General;  Laterality: N/A;   CATARACT EXTRACTION W/PHACO Left 03/28/2016   Procedure: CATARACT EXTRACTION PHACO  AND INTRAOCULAR LENS PLACEMENT LEFT EYE;  Surgeon: [name], MD;  Location: [facility];  Service: Ophthalmology;  Laterality: Left;  CDE: 4.77   CATARACT EXTRACTION W/PHACO Right 04/11/2016   Procedure: CATARACT EXTRACTION PHACO AND INTRAOCULAR LENS PLACEMENT RIGHT EYE; CDE:  4.74;  Surgeon: [name], MD;  Location: [facility];  Service: Ophthalmology;  Laterality: Right;   COLONOSCOPY  10/15/2008   Fields: Rectal polyp obliterated, not retrieved, hemorrhoids, single ascending colon diverticulum near the CV. Next colonoscopy April 2020   COLONOSCOPY N/A 12/25/2014   SLF: 1. Colorectal polyps (2) removed 2. Small internal hemorrhoids 3. the left colon is severely redundant. hyperplastic polyps   CORONARY STENT INTERVENTION N/A 07/25/2021   Procedure: CORONARY STENT INTERVENTION;  Surgeon: Sherren Mocha, MD;  Location: [place] [facility];  Service: Cardiovascular;  Laterality: N/A;   DOPPLER ECHOCARDIOGRAPHY     ESOPHAGOGASTRODUODENOSCOPY  N/A 12/25/2014   SLF: 1. Anemia most likely due to CRI, gastritis, gastric polyps 2. Moderate non-erosive gastriits and mild duodenitis.  3.TWo large gstric polyps removed.    EYE SURGERY  12/22/2010   tear duct probing-[place]   FOREIGN BODY REMOVAL  03/29/2011   Procedure: REMOVAL FOREIGN BODY EXTREMITY;  Surgeon: Arther Abbott, MD;  Location: [facility];  Service: Orthopedics;  Laterality: Right;  Removal Foreign Body Right Thumb   IR FLUORO GUIDE CV LINE RIGHT  08/06/2018   IR US GUIDE VASC ACCESS RIGHT  08/06/2018   KNEE ARTHROSCOPY  10/16/2007   left   KNEE ARTHROSCOPY WITH LATERAL MENISECTOMY Right 10/14/2015   Procedure: LEFT KNEE ARTHROSCOPY WITH PARTIAL LATERAL MENISECTOMY;  Surgeon: Carole Civil, MD;  Location: [facility];  Service: Orthopedics;  Laterality: Right;   LEFT HEART CATH AND CORONARY ANGIOGRAPHY N/A 07/25/2021   Procedure: LEFT HEART CATH AND CORONARY ANGIOGRAPHY;  Surgeon: Sherren Mocha, MD;  Location: [place] [facility];  Service: Cardiovascular;  Laterality: N/A;   LEFT HEART CATHETERIZATION WITH CORONARY ANGIOGRAM N/A 03/28/2013   Procedure: LEFT HEART CATHETERIZATION WITH CORONARY ANGIOGRAM;  Surgeon: Leonie Man, MD;  Location: [facility];  Service: Cardiovascular;  Laterality: N/A;   NM MYOVIEW LTD     PENILE PROSTHESIS IMPLANT N/A 08/16/2015   Procedure: PENILE PROTHESIS INFLATABLE, three piece, Excisional biopsy of Penile ulcer, Penile molding;  Surgeon: Carolan Clines, MD;  Location: [facility];  Service: Urology;  Laterality: N/A;   PENILE PROSTHESIS IMPLANT N/A 12/24/2017   Procedure: REMOVAL AND  REPLACEMENT  COLOPLAST PENILE PROSTHESIS;  Surgeon: Lucas Mallow, MD;  Location: [facility];  Service: Urology;  Laterality: N/A;   QUADRICEPS TENDON REPAIR  07/21/2011   Procedure: REPAIR QUADRICEP TENDON;  Surgeon: Arther Abbott, MD;  Location: [facility];  Service: Orthopedics;  Laterality: Right;   TOENAIL EXCISION     removed [id number]   UMBILICAL  HERNIA REPAIR  07/17/2005   [place]   Family History  Family History  Problem Relation Age of Onset   Hypertension Mother        MI   Cancer Mother        breast    Diabetes Mother    Diabetes Father    Hypertension Father    Hypertension Sister    Diabetes Sister    Arthritis Other    Asthma Other    Lung disease Other    Anesthesia problems Neg Hx    Hypotension Neg Hx    Malignant hyperthermia Neg Hx    Pseudochol deficiency Neg Hx    Colon  cancer Neg Hx    Social History  reports that he quit smoking about 11 years ago. His smoking use included cigarettes. He has a 25.00 pack-year smoking history. He has never used smokeless tobacco. He reports current alcohol use. He reports current drug use. Drug: Marijuana. Allergies  Allergies  Allergen Reactions   Opana [Oxymorphone Hcl] Itching   Oxymorphone Itching   Tramadol Itching   Home medications Prior to Admission medications   Medication Sig Start Date End Date Taking? Authorizing Provider  albuterol (VENTOLIN HFA) 108 (90 Base) MCG/ACT inhaler Inhale 2 puffs into the lungs every 6 (six) hours as needed for wheezing or shortness of breath. 03/25/21  Yes Rigoberto Noel, MD  aspirin EC 81 MG tablet Take 81 mg by mouth daily. Swallow whole.   Yes [provider]  budesonide-formoterol (SYMBICORT) 160-4.5 MCG/ACT inhaler Inhale 2 puffs into the lungs 2 (two) times daily. 04/07/20  Yes Collene Gobble, MD  calcitRIOL (ROCALTROL) 0.25 MCG capsule Take 0.25 mcg by mouth daily.   Yes [provider]  isosorbide mononitrate (IMDUR) 30 MG 24 hr tablet Take 1 tablet (30 mg total) by mouth daily. 12/22/21  Yes Marylu Lund., NP  LANTUS SOLOSTAR 100 UNIT/ML Solostar Pen INJECT  30 UNITS SUBCUTANEOUSLY AT BEDTIME Patient taking differently: 30 Units at bedtime. 12/15/20  Yes Reardon, Juanetta Beets, NP  losartan (COZAAR) 25 MG tablet Take 1 tablet (25 mg total) by mouth daily. 09/01/21 12/26/21 Yes Swinyer, Lanice Schwab, NP   nitroGLYCERIN (NITROSTAT) 0.4 MG SL tablet Place 1 tablet (0.4 mg total) under the tongue every 5 (five) minutes as needed for chest pain. 12/14/21  Yes Maudie Flakes, MD  pantoprazole (PROTONIX) 40 MG tablet Take 1 tablet (40 mg total) by mouth 2 (two) times daily before a meal. 07/25/21  Yes Erenest Rasher, PA-C  sevelamer (RENAGEL) 800 MG tablet Take 2,400 mg by mouth 3 (three) times daily with meals.   Yes [provider]  simvastatin (ZOCOR) 20 MG tablet Take 20 mg by mouth every morning.   Yes [provider]  ticagrelor (BRILINTA) 90 MG TABS tablet Take 1 tablet (90 mg total) by mouth 2 (two) times daily. 12/06/21  Yes Hilty, Nadean Corwin, MD  torsemide (DEMADEX) 100 MG tablet Take 100 mg by mouth daily. 05/03/21  Yes [provider]  TRADJENTA 5 MG TABS tablet TAKE 1 TABLET EVERY DAY 07/19/21  Yes Reardon, Juanetta Beets, NP  Vitamin D, Ergocalciferol, (DRISDOL) 1.25 MG (50000 UNIT) CAPS capsule Take 50,000 Units by mouth every 7 (seven) days. Monday 05/03/21  Yes [provider]  glucose blood test strip 1 each by Other route 2 (two) times daily. Use as instructed to check blood glucose twice daily 02/22/21   Brita Romp, NP  Insulin Pen Needle (DROPLET PEN NEEDLES) 31G X 5 MM MISC Use to inject insulin daily as directed 12/30/20   Cassandria Anger, MD     Vitals:   12/26/21 1231 12/26/21 1235 12/26/21 1250 12/26/21 1305  BP: (!) 146/68 127/80 (!) 146/84 (!) 155/84  Pulse:  61 69 62  Resp: '18 18 15 13  '$ Temp:      TempSrc:      SpO2:  99% 99% 99%  Weight:      Height:       Exam Gen alert, no distress No rash, cyanosis or gangrene Sclera anicteric, throat clear  No jvd or bruits Chest clear bilat to bases, no rales/  wheezing RRR no MRG Abd soft ntnd no mass or ascites +bs, PD cath intact mid abdomen GU normal male MS no joint effusions or deformity Ext no LE or UE edema, no wounds or ulcers Neuro is alert, Ox 3 , nf       Home meds  include - albuterol, aspirin, budesonide- formoterol, calcitriol 0.25 qd, isosorbide mononitrate 30, insulin lantus, losartan 25, sl ntg, pantoprazole, sevelamer 2.4gm tid ac, simvastatin, ticagrelor, torsemide, tradjenta, ergocalciferol 50K per wk     OP PD: 5 exchanges per the pt, not sure volume, does do a last fill, uses 1/2 green and 1/2 yellow bags approx     Assessment/ Plan: CAD/ nstemi - pt w/ unstable angina, hx of MI in Jan 2023 when they did DES x 1 to RCA lesion. Pt went for LHC again today which showed progression of 2 other RCA lesins to 90-95%. Underwent placement of overlapping stents w/ good results. Per cardiology.  ESRD - on CPPD. F/b Dr Juleen China. Plan PD tonight w/ 5 fills and no day bag.  Volume - no gross vol excess, LVEDP wnl at 14. Will use half 1.5% and half 2.5% HTN - cont losartan, imdur, torsemide prn Anemia esrd - Hb 11, no esa needs MBD ckd - Ca at top of range. Cont binder.       Kelly Splinter  MD 12/26/2021, 2:19 PM Recent Labs  Lab 12/22/21 1017  HGB 11.3*  CALCIUM 10.5*  CREATININE 16.87*  K 4.6

## 2021-12-26 NOTE — Progress Notes (Signed)
Patient treated with NTG SL x 2 with not change in CP.  Given GI cocktail with no change in CP.  Turned to right side and states pain has improved. Rates 3/10 dull pain in center of chest.  Patient did not want ativan at this time, but will notify if changes his mind.

## 2021-12-27 ENCOUNTER — Other Ambulatory Visit: Payer: Self-pay | Admitting: Cardiology

## 2021-12-27 ENCOUNTER — Observation Stay (HOSPITAL_BASED_OUTPATIENT_CLINIC_OR_DEPARTMENT_OTHER): Payer: Medicare Other

## 2021-12-27 ENCOUNTER — Encounter (HOSPITAL_COMMUNITY): Payer: Self-pay | Admitting: Internal Medicine

## 2021-12-27 ENCOUNTER — Observation Stay (INDEPENDENT_AMBULATORY_CARE_PROVIDER_SITE_OTHER): Payer: Medicare Other

## 2021-12-27 DIAGNOSIS — N250 Renal osteodystrophy: Secondary | ICD-10-CM | POA: Diagnosis not present

## 2021-12-27 DIAGNOSIS — I441 Atrioventricular block, second degree: Secondary | ICD-10-CM

## 2021-12-27 DIAGNOSIS — D509 Iron deficiency anemia, unspecified: Secondary | ICD-10-CM | POA: Diagnosis not present

## 2021-12-27 DIAGNOSIS — J449 Chronic obstructive pulmonary disease, unspecified: Secondary | ICD-10-CM | POA: Diagnosis not present

## 2021-12-27 DIAGNOSIS — I25110 Atherosclerotic heart disease of native coronary artery with unstable angina pectoris: Secondary | ICD-10-CM | POA: Diagnosis not present

## 2021-12-27 DIAGNOSIS — N186 End stage renal disease: Secondary | ICD-10-CM | POA: Diagnosis not present

## 2021-12-27 DIAGNOSIS — I10 Essential (primary) hypertension: Secondary | ICD-10-CM | POA: Diagnosis not present

## 2021-12-27 DIAGNOSIS — I200 Unstable angina: Secondary | ICD-10-CM | POA: Diagnosis not present

## 2021-12-27 DIAGNOSIS — I248 Other forms of acute ischemic heart disease: Secondary | ICD-10-CM | POA: Diagnosis not present

## 2021-12-27 DIAGNOSIS — Z955 Presence of coronary angioplasty implant and graft: Secondary | ICD-10-CM | POA: Diagnosis not present

## 2021-12-27 DIAGNOSIS — D631 Anemia in chronic kidney disease: Secondary | ICD-10-CM | POA: Diagnosis not present

## 2021-12-27 DIAGNOSIS — E1122 Type 2 diabetes mellitus with diabetic chronic kidney disease: Secondary | ICD-10-CM | POA: Diagnosis not present

## 2021-12-27 DIAGNOSIS — Z992 Dependence on renal dialysis: Secondary | ICD-10-CM | POA: Diagnosis not present

## 2021-12-27 DIAGNOSIS — Z79899 Other long term (current) drug therapy: Secondary | ICD-10-CM | POA: Diagnosis not present

## 2021-12-27 DIAGNOSIS — J45909 Unspecified asthma, uncomplicated: Secondary | ICD-10-CM | POA: Diagnosis not present

## 2021-12-27 DIAGNOSIS — Z794 Long term (current) use of insulin: Secondary | ICD-10-CM | POA: Diagnosis not present

## 2021-12-27 DIAGNOSIS — Z7982 Long term (current) use of aspirin: Secondary | ICD-10-CM | POA: Diagnosis not present

## 2021-12-27 DIAGNOSIS — I132 Hypertensive heart and chronic kidney disease with heart failure and with stage 5 chronic kidney disease, or end stage renal disease: Secondary | ICD-10-CM | POA: Diagnosis not present

## 2021-12-27 DIAGNOSIS — I252 Old myocardial infarction: Secondary | ICD-10-CM | POA: Diagnosis not present

## 2021-12-27 LAB — CBC
HCT: 29 % — ABNORMAL LOW (ref 39.0–52.0)
Hemoglobin: 9.3 g/dL — ABNORMAL LOW (ref 13.0–17.0)
MCH: 29.6 pg (ref 26.0–34.0)
MCHC: 32.1 g/dL (ref 30.0–36.0)
MCV: 92.4 fL (ref 80.0–100.0)
Platelets: 109 10*3/uL — ABNORMAL LOW (ref 150–400)
RBC: 3.14 MIL/uL — ABNORMAL LOW (ref 4.22–5.81)
RDW: 13.2 % (ref 11.5–15.5)
WBC: 6.1 10*3/uL (ref 4.0–10.5)
nRBC: 0 % (ref 0.0–0.2)

## 2021-12-27 LAB — BASIC METABOLIC PANEL
Anion gap: 9 (ref 5–15)
BUN: 52 mg/dL — ABNORMAL HIGH (ref 8–23)
CO2: 29 mmol/L (ref 22–32)
Calcium: 9.1 mg/dL (ref 8.9–10.3)
Chloride: 95 mmol/L — ABNORMAL LOW (ref 98–111)
Creatinine, Ser: 17.44 mg/dL — ABNORMAL HIGH (ref 0.61–1.24)
GFR, Estimated: 3 mL/min — ABNORMAL LOW (ref >60)
Glucose, Bld: 134 mg/dL — ABNORMAL HIGH (ref 70–99)
Potassium: 3.6 mmol/L (ref 3.5–5.1)
Sodium: 133 mmol/L — ABNORMAL LOW (ref 135–145)

## 2021-12-27 LAB — ECHOCARDIOGRAM COMPLETE
AR max vel: 1.71 cm2
AV Area VTI: 1.61 cm2
AV Area mean vel: 1.84 cm2
AV Mean grad: 16 mmHg
AV Peak grad: 28.1 mmHg
Ao pk vel: 2.65 m/s
Area-P 1/2: 2.54 cm2
Calc EF: 63.9 %
Height: 64 in
P 1/2 time: 644 msec
S' Lateral: 3.1 cm
Single Plane A2C EF: 65.6 %
Single Plane A4C EF: 63.9 %
Weight: 3650.82 oz

## 2021-12-27 LAB — GLUCOSE, CAPILLARY: Glucose-Capillary: 118 mg/dL — ABNORMAL HIGH (ref 70–99)

## 2021-12-27 NOTE — Progress Notes (Signed)
Critical EKG result this morning [place] I - unchanged from prior - pt resting without c/o.  VSS.  Notified Dr. Alfred Levins via Shea Evans of critical EKG.

## 2021-12-27 NOTE — Discharge Summary (Addendum)
Discharge Summary    Patient ID: [name] [name] MRN: [id number]; DOB: 1950-09-10  Admit date: 12/26/2021 Discharge date: 12/27/2021  PCP:  Carrolyn Meiers, MD   [facility] Cardiologist:  Pixie Casino, MD      Discharge Diagnoses    Principal Problem:   Unstable angina [facility]) Active Problems:   Hyperlipidemia   Essential hypertension   End stage renal disease ([place])   Second degree AV block, Mobitz type I  Diagnostic Studies/Procedures    Cath: 12/26/21  Conclusions: Severe single-vessel coronary artery disease with sequential 90-95% mid RCA stenoses at sites of angioplasty in 07/2021.  Previously placed proximal RCA stent demonstrates mild-moderate in-stent restenosis in its distal third.  There is also 80% lesion in the distal rPDA. Stable appearance of mild-moderate, nonobstructive CAD involving the left coronary artery. Mildly elevated left ventricular filling pressure (LVEDP 15-20 mmHg). Successful PCI to sequential mid RCA stenoses using overlapping Onyx Frontier 3.5 x 18 mm and 3.5 x 26 mm drug-eluting stents (more proximal stent also overlaps previously placed stent) with 0% residual stenosis and TIMI-3 flow.   Recommendations: Admit for overnight extended recovery. Consult nephrology to assist with continuation of home PD regimen. Obtain echocardiogram to reassess LVEF. Continue dual antiplatelet therapy with aspirin and ticagrelor for at least 12 months from NSTEMI in 07/2021, though ideally indefinite. Aggressive secondary prevention.   Nelva Bush, MD [facility]  Diagnostic Dominance: Right  Intervention     Echo: 12/27/21  IMPRESSIONS     1. Left ventricular ejection fraction, by estimation, is 60 to 65%. The  left ventricle has normal function. The left ventricle has no regional  wall motion abnormalities. There is mild left ventricular hypertrophy.  Left ventricular diastolic parameters  are indeterminate.   2.  Right ventricular systolic function is normal. The right ventricular  size is normal. There is normal pulmonary artery systolic pressure. The  estimated right ventricular systolic pressure is 47.6 mmHg.   3. Left atrial size was mildly dilated.   4. Right atrial size was mildly dilated.   5. The mitral valve is degenerative. Trivial mitral valve regurgitation.  No evidence of mitral stenosis. Moderate mitral annular calcification.   6. The aortic valve is tricuspid. Aortic valve regurgitation is mild.  Mild to moderate aortic valve stenosis. Vmax 2.8 m/s, MG 46mHg, AVA 1.5  cm^2, DI 0.46   7. The inferior vena cava is normal in size with greater than 50%  respiratory variability, suggesting right atrial pressure of 3 mmHg.   FINDINGS   Left Ventricle: Left ventricular ejection fraction, by estimation, is 60  to 65%. The left ventricle has normal function. The left ventricle has no  regional wall motion abnormalities. The left ventricular internal cavity  size was normal in size. There is   mild left ventricular hypertrophy. Left ventricular diastolic parameters  are indeterminate.   Right Ventricle: The right ventricular size is normal. No increase in  right ventricular wall thickness. Right ventricular systolic function is  normal. There is normal pulmonary artery systolic pressure. The tricuspid  regurgitant velocity is 2.79 m/s, and   with an assumed right atrial pressure of 3 mmHg, the estimated right  ventricular systolic pressure is 354.6mmHg.   Left Atrium: Left atrial size was mildly dilated.   Right Atrium: Right atrial size was mildly dilated.   Pericardium: Trivial pericardial effusion is present.   Mitral Valve: The mitral valve is degenerative in appearance. Moderate  mitral annular calcification. Trivial mitral  valve regurgitation. No  evidence of mitral valve stenosis.   Tricuspid Valve: The tricuspid valve is normal in structure. Tricuspid  valve regurgitation  is trivial.   Aortic Valve: The aortic valve is tricuspid. Aortic valve regurgitation is  mild. Aortic regurgitation PHT measures 644 msec. Mild to moderate aortic  stenosis is present. Aortic valve mean gradient measures 16.0 mmHg. Aortic  valve peak gradient measures  28.1 mmHg. Aortic valve area, by VTI measures 1.61 cm.   Pulmonic Valve: The pulmonic valve was not well visualized. Pulmonic valve  regurgitation is not visualized.   Aorta: The aortic root and ascending aorta are structurally normal, with  no evidence of dilitation.   Venous: The inferior vena cava is normal in size with greater than 50%  respiratory variability, suggesting right atrial pressure of 3 mmHg.   IAS/Shunts: The interatrial septum was not well visualized.  _____________   History of Present Illness     [name] [name] is a 71 y.o. male with PMH of HTN, HLD, CAD s/p MI 2014 and NSTEMI 2023 treated with DES x1 to proximal RCA lesion with angioplasty to mid RCA lesions IDDM, OSA, polysubstance abuse with cocaine, ESRD on home peritoneal dialysis who presented to the office on 6/8 for follow up.   [name] [name] is a former patient of Dr. Rollene Fare who now follows with Dr. Debara Pickett. Patient had initial left heart cath in 03/2013 following abnormal Myoview that revealed minimal CAD with no significant disease.  He was seen in the ED 08/2018 with complaint of left-sided intermittent chest pain.  ACS was ruled out and 2D echo was completed with EF of 50-55% and severely increased left ventricular wall, moderately dilated RA and LA, with normal valve function.  Nuclear stress test was also completed that revealed low risk normal study.   Patient contacted cardiology office on 07/22/2021 with complaint for chest pain while loading wood and was advised to go to the ED.  In the ED his chest pain resolved prior to examination.  His work-up was significant for Mobitz 1 second-degree AV block and troponins were elevated at 3000 428.   Patient was ruled in for NSTEMI and started on aspirin and full dose heparin.  He was transferred to [facility] for left heart cath that revealed severe RCA stenosis treated with drug-eluting stent and angioplasty.  He was started on aspirin and Brilinta for 1 year.  2D echo completed following cath with EF of 55-60% and mild MVR with mildly dilated LA and RA, with pseudo normalization of diastolic function.  He also tested positive for COVID-19 with chest x-ray clear for pulmonary disease.  He returned to the emergency room 2 days later with complaint of hypotension and losartan was discontinued.   He was seen in follow-up on 09/01/2021 by Christen Bame, NP.  He was scheduled to have follow-up with pulmonology and was noted to have labile BP at home.  He was also experiencing dyspnea but with no CP and was felt not related to ischemia.  He was seen later on 12/01/2021 by Dr. Debara Pickett and reported doing well with no complaints of chest pain.  No medication changes were made at that time.  Mr. Teed returned to [facility], ED with complaint of chest pain. His pain was described as nonradiating and associated with nausea.  Pain was 7 out of 10 and intermittent in nature resolved with nitro and oxycodone.  He was given the option of admission versus going home and he chose  to go home. Patient reported to cardiac rehab on 12/19/2021 and during exercise developed nausea and burning chest pain.  Patient stated he felt better after drinking ginger ale and was advised to follow-up with cardiologist.   At recent office visit patient reported that he had been experiencing anginal symptoms with shortness of breath.  EKG obtained in the office showed new second-degree AV block that was asymptomatic. Patient wife also stated that he had missed several doses of DAPT last month and had not taken his prescribed dose of Toprol-XL since his heart attack. Given his on-going symptoms, he was set up for outpatient cardiac cath.    Hospital Course     Consultants: nephrolohgy  Underwent outpatient cardiac catheterization with Dr. And noted above with severe single-vessel CAD with sequential 90 to 95% mid RCA stenosis treated with PCI/DES x2 overlapping previously placed stent to proximal RCA.  Stable mild to moderate nonobstructive CAD in the LAD.  Recommendations to continue on DAPT with aspirin/Brilinta for at least 12 months likely indefinitely.  He was observed overnight without complication.  Follow-up echocardiogram showed LVEF of 60 to 65%, no regional wall motion abnormality, mild LVH, normal RV, mild biatrial enlargement, trivial MR, mild to moderate aortic stenosis.  He was noted to have continued episodes of second-degree AV block but remained asymptomatic.  Of note he is no longer on beta-blocker therapy, which was discontinued at his recent office visit.  He received peritoneal dialysis during this admission.  Given his newly diagnosed second-degree AV block we will plan for outpatient 14-day Zio patch.  Patient was seen by Dr. Gwenlyn Found and deemed stable for discharge home. Follow up in the office has been arranged.   Did the patient have an acute coronary syndrome (MI, NSTEMI, STEMI, etc) this admission?:  No                               Did the patient have a percutaneous coronary intervention (stent / angioplasty)?:  Yes.     Cath/PCI Registry Performance & Quality Measures: Aspirin prescribed? - Yes ADP Receptor Inhibitor (Plavix/Clopidogrel, Brilinta/Ticagrelor or Effient/Prasugrel) prescribed (includes medically managed patients)? - Yes High Intensity Statin (Lipitor 40-'80mg'$  or Crestor 20-'40mg'$ ) prescribed? - No - lipids well controlled, defer adjustment to PCP cardiologist For EF <40%, was ACEI/ARB prescribed? - Not Applicable (EF >/= 19%) For EF <40%, Aldosterone Antagonist (Spironolactone or Eplerenone) prescribed? - Not Applicable (EF >/= 37%) Cardiac Rehab Phase II ordered? - Yes   The patient will  be scheduled for a TOC follow up appointment in 10-14 days.  A message has been sent to the [facility] and Scheduling Pool at the office where the patient should be seen for follow up.  _____________  Discharge Vitals Blood pressure (!) 120/92, pulse 81, temperature 98.3 F (36.8 C), temperature source Oral, resp. rate 20, height '5\' 4"'$  (9.024 m), weight 103.5 kg, SpO2 100 %.  Filed Weights   12/26/21 0723 12/27/21 0800  Weight: 103.9 kg 103.5 kg    Labs & Radiologic Studies    CBC Recent Labs    12/27/21 0215  WBC 6.1  HGB 9.3*  HCT 29.0*  MCV 92.4  PLT 097*   Basic Metabolic Panel Recent Labs    12/27/21 0215  NA 133*  K 3.6  CL 95*  CO2 29  GLUCOSE 134*  BUN 52*  CREATININE 17.44*  CALCIUM 9.1   Liver Function Tests No results for  input(s): "AST", "ALT", "ALKPHOS", "BILITOT", "PROT", "ALBUMIN" in the last 72 hours. No results for input(s): "LIPASE", "AMYLASE" in the last 72 hours. High Sensitivity Troponin:   Recent Labs  Lab 12/14/21 0001 12/14/21 0142  TROPONINIHS 51* 53*    BNP Invalid input(s): "POCBNP" D-Dimer No results for input(s): "DDIMER" in the last 72 hours. Hemoglobin A1C No results for input(s): "HGBA1C" in the last 72 hours. Fasting Lipid Panel No results for input(s): "CHOL", "HDL", "LDLCALC", "TRIG", "CHOLHDL", "LDLDIRECT" in the last 72 hours. Thyroid Function Tests No results for input(s): "TSH", "T4TOTAL", "T3FREE", "THYROIDAB" in the last 72 hours.  Invalid input(s): "FREET3" _____________  ECHOCARDIOGRAM COMPLETE  Result Date: 12/27/2021    ECHOCARDIOGRAM REPORT   Patient Name:   [name] [name] [facility] Date of Exam: 12/27/2021 Medical Rec #:  [id number]      Height:       64.0 in Accession #:    [id number]     Weight:       228.2 lb Date of Birth:  Jun 08, 1951     BSA:          2.069 m Patient Age:    80 years       BP:           142/68 mmHg Patient Gender: M              HR:           67 bpm. Exam Location:  Inpatient Procedure: 2D Echo,  Cardiac Doppler and Color Doppler Indications:    Ischemic heart disease  History:        Patient has prior history of Echocardiogram examinations. CAD;                 Risk Factors:Hypertension, Diabetes and Sleep Apnea.  Sonographer:    Jyl Heinz Referring Phys: [phone] CHRISTOPHER END IMPRESSIONS  1. Left ventricular ejection fraction, by estimation, is 60 to 65%. The left ventricle has normal function. The left ventricle has no regional wall motion abnormalities. There is mild left ventricular hypertrophy. Left ventricular diastolic parameters are indeterminate.  2. Right ventricular systolic function is normal. The right ventricular size is normal. There is normal pulmonary artery systolic pressure. The estimated right ventricular systolic pressure is 47.4 mmHg.  3. Left atrial size was mildly dilated.  4. Right atrial size was mildly dilated.  5. The mitral valve is degenerative. Trivial mitral valve regurgitation. No evidence of mitral stenosis. Moderate mitral annular calcification.  6. The aortic valve is tricuspid. Aortic valve regurgitation is mild. Mild to moderate aortic valve stenosis. Vmax 2.8 m/s, MG 25mHg, AVA 1.5 cm^2, DI 0.46  7. The inferior vena cava is normal in size with greater than 50% respiratory variability, suggesting right atrial pressure of 3 mmHg. FINDINGS  Left Ventricle: Left ventricular ejection fraction, by estimation, is 60 to 65%. The left ventricle has normal function. The left ventricle has no regional wall motion abnormalities. The left ventricular internal cavity size was normal in size. There is  mild left ventricular hypertrophy. Left ventricular diastolic parameters are indeterminate. Right Ventricle: The right ventricular size is normal. No increase in right ventricular wall thickness. Right ventricular systolic function is normal. There is normal pulmonary artery systolic pressure. The tricuspid regurgitant velocity is 2.79 m/s, and  with an assumed right atrial  pressure of 3 mmHg, the estimated right ventricular systolic pressure is 325.9mmHg. Left Atrium: Left atrial size was mildly dilated. Right Atrium: Right atrial size was mildly dilated.  Pericardium: Trivial pericardial effusion is present. Mitral Valve: The mitral valve is degenerative in appearance. Moderate mitral annular calcification. Trivial mitral valve regurgitation. No evidence of mitral valve stenosis. Tricuspid Valve: The tricuspid valve is normal in structure. Tricuspid valve regurgitation is trivial. Aortic Valve: The aortic valve is tricuspid. Aortic valve regurgitation is mild. Aortic regurgitation PHT measures 644 msec. Mild to moderate aortic stenosis is present. Aortic valve mean gradient measures 16.0 mmHg. Aortic valve peak gradient measures 28.1 mmHg. Aortic valve area, by VTI measures 1.61 cm. Pulmonic Valve: The pulmonic valve was not well visualized. Pulmonic valve regurgitation is not visualized. Aorta: The aortic root and ascending aorta are structurally normal, with no evidence of dilitation. Venous: The inferior vena cava is normal in size with greater than 50% respiratory variability, suggesting right atrial pressure of 3 mmHg. IAS/Shunts: The interatrial septum was not well visualized.  LEFT VENTRICLE PLAX 2D LVIDd:         5.10 cm      Diastology LVIDs:         3.10 cm      LV e' medial:    8.92 cm/s LV PW:         1.30 cm      LV E/e' medial:  10.9 LV IVS:        1.30 cm      LV e' lateral:   9.46 cm/s LVOT diam:     2.00 cm      LV E/e' lateral: 10.3 LV SV:         83 LV SV Index:   40 LVOT Area:     3.14 cm  LV Volumes (MOD) LV vol d, MOD A2C: 115.0 ml LV vol d, MOD A4C: 129.0 ml LV vol s, MOD A2C: 39.6 ml LV vol s, MOD A4C: 46.6 ml LV SV MOD A2C:     75.4 ml LV SV MOD A4C:     129.0 ml LV SV MOD BP:      78.2 ml RIGHT VENTRICLE            IVC RV Basal diam:  3.20 cm    IVC diam: 1.40 cm RV Mid diam:    2.30 cm RV S prime:     9.68 cm/s TAPSE (M-mode): 2.0 cm LEFT ATRIUM              Index        RIGHT ATRIUM           Index LA diam:        4.50 cm 2.17 cm/m   RA Area:     21.40 cm LA Vol (A2C):   86.0 ml 41.56 ml/m  RA Volume:   63.10 ml  30.50 ml/m LA Vol (A4C):   72.4 ml 34.99 ml/m LA Biplane Vol: 82.6 ml 39.92 ml/m  AORTIC VALVE AV Area (Vmax):    1.71 cm AV Area (Vmean):   1.84 cm AV Area (VTI):     1.61 cm AV Vmax:           265.00 cm/s AV Vmean:          189.667 cm/s AV VTI:            0.512 m AV Peak Grad:      28.1 mmHg AV Mean Grad:      16.0 mmHg LVOT Vmax:         144.50 cm/s LVOT Vmean:        111.000 cm/s  LVOT VTI:          0.263 m LVOT/AV VTI ratio: 0.51 AI PHT:            644 msec  AORTA Ao Root diam: 3.20 cm Ao Asc diam:  3.40 cm MITRAL VALVE                TRICUSPID VALVE MV Area (PHT): 2.54 cm     TR Peak grad:   31.1 mmHg MV Decel Time: 299 msec     TR Vmax:        279.00 cm/s MV E velocity: 97.40 cm/s MV A velocity: 112.00 cm/s  SHUNTS MV E/A ratio:  0.87         Systemic VTI:  0.26 m                             Systemic Diam: 2.00 cm Oswaldo Milian MD Electronically signed by Oswaldo Milian MD Signature Date/Time: 12/27/2021/10:49:38 AM    Final    CARDIAC CATHETERIZATION  Result Date: 12/26/2021 Conclusions: Severe single-vessel coronary artery disease with sequential 90-95% mid RCA stenoses at sites of angioplasty in 07/2021.  Previously placed proximal RCA stent demonstrates mild-moderate in-stent restenosis in its distal third.  There is also 80% lesion in the distal rPDA. Stable appearance of mild-moderate, nonobstructive CAD involving the left coronary artery. Mildly elevated left ventricular filling pressure (LVEDP 15-20 mmHg). Successful PCI to sequential mid RCA stenoses using overlapping Onyx Frontier 3.5 x 18 mm and 3.5 x 26 mm drug-eluting stents (more proximal stent also overlaps previously placed stent) with 0% residual stenosis and TIMI-3 flow. Recommendations: Admit for overnight extended recovery. Consult nephrology to assist with  continuation of home PD regimen. Obtain echocardiogram to reassess LVEF. Continue dual antiplatelet therapy with aspirin and ticagrelor for at least 12 months from NSTEMI in 07/2021, though ideally indefinite. Aggressive secondary prevention. Nelva Bush, MD [facility]  DG Chest 2 View  Result Date: 12/14/2021 CLINICAL DATA:  Chest pain. EXAM: CHEST - 2 VIEW COMPARISON:  Chest x-ray 07/22/2021 FINDINGS: The heart size and mediastinal contours are stable. Both lungs are clear. The visualized skeletal structures are unremarkable. IMPRESSION: No active cardiopulmonary disease. Electronically Signed   By: Ronney Asters M.D.   On: 12/14/2021 00:00    Disposition   Pt is being discharged home today in good condition.  Follow-up Plans & Appointments     Follow-up Information     Almyra Deforest, [place] Follow up on 01/11/2022.   Specialties: Cardiology, Radiology Why: at 3:35pm for your follow up appt Contact information: [address] [place] [address] [phone]                Discharge Instructions     Amb Referral to Cardiac Rehabilitation   Complete by: As directed    Diagnosis: Coronary Stents   After initial evaluation and assessments completed: Virtual Based Care may be provided alone or in conjunction with Phase 2 Cardiac Rehab based on patient barriers.: Yes   Diet - low sodium heart healthy   Complete by: As directed    Discharge instructions   Complete by: As directed    Groin Site Care Refer to this sheet in the next few weeks. These instructions provide you with information on caring for yourself after your procedure. Your caregiver may also give you more specific instructions. Your treatment has been planned according to current medical practices, but problems  sometimes occur. Call your caregiver if you have any problems or questions after your procedure. HOME CARE INSTRUCTIONS You may shower 24 hours after the procedure. Remove the bandage  (dressing) and gently wash the site with plain soap and water. Gently pat the site dry.  Do not apply powder or lotion to the site.  Do not sit in a bathtub, swimming pool, or whirlpool for 5 to 7 days.  No bending, squatting, or lifting anything over 10 pounds (4.5 kg) as directed by your caregiver.  Inspect the site at least twice daily.  Do not drive home if you are discharged the same day of the procedure. Have someone else drive you.  You may drive 24 hours after the procedure unless otherwise instructed by your caregiver.  What to expect: Any bruising will usually fade within 1 to 2 weeks.  Blood that collects in the tissue (hematoma) may be painful to the touch. It should usually decrease in size and tenderness within 1 to 2 weeks.  SEEK IMMEDIATE MEDICAL CARE IF: You have unusual pain at the groin site or down the affected leg.  You have redness, warmth, swelling, or pain at the groin site.  You have drainage (other than a small amount of blood on the dressing).  You have chills.  You have a fever or persistent symptoms for more than 72 hours.  You have a fever and your symptoms suddenly get worse.  Your leg becomes pale, cool, tingly, or numb.  You have heavy bleeding from the site. Hold pressure on the site. Marland Kitchen  PLEASE DO NOT MISS ANY DOSES OF YOUR BRILINTA!!!!! Also keep a log of you blood pressures and bring back to your follow up appt. Please call the office with any questions.   Patients taking blood thinners should generally stay away from medicines like ibuprofen, Advil, Motrin, naproxen, and Aleve due to risk of stomach bleeding. You may take Tylenol as directed or talk to your primary doctor about alternatives.  PLEASE ENSURE THAT YOU DO NOT RUN OUT OF YOUR BRILINTA. This medication is very important to remain on for at least one year. IF you have issues obtaining this medication due to cost please CALL the office 3-5 business days prior to running out in order to prevent  missing doses of this medication.   Increase activity slowly   Complete by: As directed    No wound care   Complete by: As directed        Discharge Medications   Allergies as of 12/27/2021       Reactions   Opana [oxymorphone Hcl] Itching   Oxymorphone Itching   Tramadol Itching        Medication List     TAKE these medications    albuterol 108 (90 Base) MCG/ACT inhaler Commonly known as: VENTOLIN HFA Inhale 2 puffs into the lungs every 6 (six) hours as needed for wheezing or shortness of breath.   aspirin EC 81 MG tablet Take 81 mg by mouth daily. Swallow whole.   budesonide-formoterol 160-4.5 MCG/ACT inhaler Commonly known as: SYMBICORT Inhale 2 puffs into the lungs 2 (two) times daily.   calcitRIOL 0.25 MCG capsule Commonly known as: ROCALTROL Take 0.25 mcg by mouth daily.   Droplet Pen Needles 31G X 5 MM Misc Generic drug: Insulin Pen Needle Use to inject insulin daily as directed   glucose blood test strip 1 each by Other route 2 (two) times daily. Use as instructed to check blood glucose twice  daily   isosorbide mononitrate 30 MG 24 hr tablet Commonly known as: IMDUR Take 1 tablet (30 mg total) by mouth daily.   Lantus SoloStar 100 UNIT/ML Solostar Pen Generic drug: insulin glargine INJECT  30 UNITS SUBCUTANEOUSLY AT BEDTIME What changed: See the new instructions.   losartan 25 MG tablet Commonly known as: COZAAR Take 1 tablet (25 mg total) by mouth daily.   nitroGLYCERIN 0.4 MG SL tablet Commonly known as: NITROSTAT Place 1 tablet (0.4 mg total) under the tongue every 5 (five) minutes as needed for chest pain.   pantoprazole 40 MG tablet Commonly known as: PROTONIX Take 1 tablet (40 mg total) by mouth 2 (two) times daily before a meal.   sevelamer 800 MG tablet Commonly known as: RENAGEL Take 2,400 mg by mouth 3 (three) times daily with meals.   simvastatin 20 MG tablet Commonly known as: ZOCOR Take 20 mg by mouth every morning.    ticagrelor 90 MG Tabs tablet Commonly known as: BRILINTA Take 1 tablet (90 mg total) by mouth 2 (two) times daily.   torsemide 100 MG tablet Commonly known as: DEMADEX Take 100 mg by mouth daily.   Tradjenta 5 MG Tabs tablet Generic drug: linagliptin TAKE 1 TABLET EVERY DAY   Vitamin D (Ergocalciferol) 1.25 MG (50000 UNIT) Caps capsule Commonly known as: DRISDOL Take 50,000 Units by mouth every 7 (seven) days. Monday           Outstanding Labs/Studies   Outpatient ZIO patch  Duration of Discharge Encounter   Greater than 30 minutes including physician time.  Signed, Reino Bellis, NP 12/27/2021, 12:01 PM   Agree with note by Reino Bellis NP-C  Patient mated with accelerated chest pain.  Cardiac catheterization performed by Dr. Saunders Revel revealed several lesions in the RCA which underwent PCI drug-eluting stenting.  2D echo revealed normal LV function.  The left coronary system was free of disease.  Patient does have end-stage renal disease on hemodialysis.  He had no chest pain after the procedure and was deemed stable for discharge.  Lorretta Harp, M.D., [place], [facility], [place]verta [place] [address]. [place], [place]  [address]  [phone] 12/27/2021 4:51 PM

## 2021-12-27 NOTE — Progress Notes (Unsigned)
Enrolled patient for a 14 day Zio XT monitor to be mailed to patients home  Dr Hilty to read 

## 2021-12-27 NOTE — Progress Notes (Signed)
CARDIAC REHAB PHASE I   PRE:  Rate/Rhythm: 86 A-fib?  BP:  Sitting: 120/92      SaO2: 100  MODE:  Ambulation: 430 ft   POST:  Rate/Rhythm: 144 A-fib? & V-tach  BP:  Sitting: 156/70      SaO2: 100  Seen pt from [phone] pt was ambulated through hallways with no hands on assistance. During ambulation the telemetry showed that the pt was in a-fib and had a run of PVCs. During this event the pt denied feeling dizzy, chest pain, palpitations, blurred vision, and any other atypical symptoms. Dr. Gwenlyn Found is aware of this event. Pt was returned to chair and educated on stent procedure, Aspirin  & Birlinta med use, restrictions, ex guidelines, NTG use, heart healthy diet, diabetic diet, and CRPII. Pt is being referred to CRPII in [place].     [name] [name]  9:43 AM 12/27/2021

## 2021-12-28 DIAGNOSIS — Z992 Dependence on renal dialysis: Secondary | ICD-10-CM | POA: Diagnosis not present

## 2021-12-28 DIAGNOSIS — N250 Renal osteodystrophy: Secondary | ICD-10-CM | POA: Diagnosis not present

## 2021-12-28 DIAGNOSIS — N186 End stage renal disease: Secondary | ICD-10-CM | POA: Diagnosis not present

## 2021-12-28 DIAGNOSIS — D631 Anemia in chronic kidney disease: Secondary | ICD-10-CM | POA: Diagnosis not present

## 2021-12-28 DIAGNOSIS — D509 Iron deficiency anemia, unspecified: Secondary | ICD-10-CM | POA: Diagnosis not present

## 2021-12-29 ENCOUNTER — Other Ambulatory Visit: Payer: Self-pay | Admitting: *Deleted

## 2021-12-29 ENCOUNTER — Encounter: Payer: Self-pay | Admitting: *Deleted

## 2021-12-29 ENCOUNTER — Encounter: Payer: Medicare Other | Admitting: Vascular Surgery

## 2021-12-29 DIAGNOSIS — Z992 Dependence on renal dialysis: Secondary | ICD-10-CM | POA: Diagnosis not present

## 2021-12-29 DIAGNOSIS — D509 Iron deficiency anemia, unspecified: Secondary | ICD-10-CM | POA: Diagnosis not present

## 2021-12-29 DIAGNOSIS — N186 End stage renal disease: Secondary | ICD-10-CM | POA: Diagnosis not present

## 2021-12-29 DIAGNOSIS — D631 Anemia in chronic kidney disease: Secondary | ICD-10-CM | POA: Diagnosis not present

## 2021-12-29 DIAGNOSIS — I441 Atrioventricular block, second degree: Secondary | ICD-10-CM

## 2021-12-29 DIAGNOSIS — N250 Renal osteodystrophy: Secondary | ICD-10-CM | POA: Diagnosis not present

## 2021-12-29 NOTE — Patient Instructions (Signed)
Visit Information  Thank you for taking time to visit with me today. Please don't hesitate to contact me if I can be of assistance to you before our next scheduled telephone appointment.  Following are the goals we discussed today:  (Copy and paste patient goals from clinical care plan here)  Our next appointment is by telephone on Thursday, June 22nd at 1:00 pm.  Following is a copy of your care plan:  Care Plan : [facility] NP Plan of CARE  Updates made by Deloria Lair, NP since 12/29/2021 12:00 AM     Problem: Mutiple co-morbidities: CAD, ESRD, DM   Priority: High  Onset Date: 12/29/2021     Goal: Patient will not readmit in the next 30 days by pt and chart review.   Start Date: 12/29/2021  Expected End Date: 01/30/2022  Note:   Current Barriers:  Chronic Disease Management support and education needs related to CAD, DMII, and ESRD  RNCM Clinical Goal(s):  Patient will take all medications exactly as prescribed and will call provider for medication related questions as evidenced by pt report.    attend all scheduled medical appointments: cardiology, primary, endocrinology, renal as evidenced by pt report and chart review.        demonstrate improved adherence to prescribed treatment plan for CAD, DMII, and ESRD as evidenced by NO READMISSION, FOLLOWING DIET AND DAILY MONITORING  through collaboration with RN Care manager, provider, and care team.   Interventions: Inter-disciplinary care team collaboration (see longitudinal plan of care) Evaluation of current treatment plan related to  self management and patient's adherence to plan as established by provider  Patient Goals/Self-Care Activities: Take medications as prescribed   Attend all scheduled provider appointments Call pharmacy for medication refills 3-7 days in advance of running out of medications Call provider office for new concerns or questions       Goal: Patient will check 4 pm glucose levels and all dietary intake  on 3 days over the next week as evidenced by pt report.   Start Date: 12/29/2021  Expected End Date: 01/06/2022  Priority: Medium  Note:   Current Barriers:  Chronic Disease Management support and education needs related to CAD, DMII, and ESRD  RNCM Clinical Goal(s):  Patient will work with NP to establish carb knowledge and intake as evidenced by pt report  through collaboration with Consulting civil engineer, provider, and care team.   Interventions: Inter-disciplinary care team collaboration (see longitudinal plan of care) Evaluation of current treatment plan related to  self management and patient's adherence to plan as established by provider  Patient Goals/Self-Care Activities: Take medications as prescribed   Attend all scheduled provider appointments Call pharmacy for medication refills 3-7 days in advance of running out of medications Call provider office for new concerns or questions       Problem: Obesity (Class 2, BMI 39.17)   Priority: Low  Onset Date: 12/29/2021     Long-Range Goal: Pt will reduce carbs in an effort to lose 10# over the next 3 months   Start Date: 12/29/2021  Expected End Date: 04/14/2022  Priority: Low  Note:   Current Barriers:  Chronic Disease Management support and education needs related to lose weight by carb counting and reduction.  RNCM Clinical Goal(s):  Patient will demonstrate improved adherence to prescribed treatment plan for WEIGHT REDUCTION as evidenced by pt report  through collaboration with RN Care manager, provider, and care team.   Interventions: Inter-disciplinary care team collaboration (see longitudinal plan  of care) Evaluation of current treatment plan related to  self management and patient's adherence to plan as established by provider  Patient Goals/Self-Care Activities: Perform all self care activities independently  Diet diary and carb counting       The patient verbalized understanding of instructions, educational  materials, and care plan provided today and agreed to receive a mailed copy of patient instructions, educational materials, and care plan.   Telephone follow up appointment with care management team member scheduled for: 1 week.  [name] [name], MSN, [facility] Gerontological Nurse Practitioner [facility] Care Management [phone]

## 2021-12-29 NOTE — Patient Outreach (Signed)
[facility]) Care Management Geriatric Nurse Practitioner Note   12/29/2021 Name:  [name] [name] MRN:  [id number] DOB:  01-Aug-1950  Summary: NEW ACO REACH PT WITH FOCUS ON CAD, DM, ESRD  Recommendations/Changes made from today's visit: WORK WITH NPT TO [place].  Subjective: [name] [name] is an 71 y.o. year old male who is a primary patient of [name], [name], MD. The care management team was consulted for assistance with care management and/or care coordination needs.    Geriatric Nurse Practitioner completed Telephone Visit today.   Patient Active Problem List   Diagnosis Date Noted   Second degree AV block, Mobitz type I 12/27/2021   Unstable angina (HCC)    NSTEMI (non-ST elevated myocardial infarction) [facility])    Rectal bleeding 07/13/2021   Allergic rhinitis 04/07/2020   Asthma 10/02/2019   Chest pain 08/29/2018   Constipation 02/28/2018   End stage renal disease ([place]) 11/13/2017   Foul smelling urine 12/14/2016   Coronary artery disease involving native coronary artery of native heart without angina pectoris 10/27/2016   Chronic kidney disease, stage IV (severe) ([place]) 10/27/2016   Morbid obesity due to excess calories ([place]) 03/22/2016   CAD (coronary artery disease)    Erectile dysfunction 08/16/2015   Hemorrhoids 07/05/2015   Vitamin D deficiency 06/04/2015   OSA on CPAP 05/14/2015   Normocytic anemia 03/24/2015   Fatty liver 12/01/2014   Back pain 06/05/2013   OA (osteoarthritis) of knee 03/15/2011   CTS (carpal tunnel syndrome) 03/15/2011   Diabetes mellitus with stage 5 chronic kidney disease ([place]) 09/29/2008   Hyperlipidemia 09/29/2008   Essential hypertension 09/29/2008   GERD 09/29/2008   Patient was recently discharged from hospital and all medications have been reviewed. Outpatient Encounter Medications as of 01/05/2022  Medication Sig   albuterol (VENTOLIN HFA) 108 (90 Base) MCG/ACT inhaler Inhale 2  puffs into the lungs every 6 (six) hours as needed for wheezing or shortness of breath.   aspirin EC 81 MG tablet Take 81 mg by mouth daily. Swallow whole.   budesonide-formoterol (SYMBICORT) 160-4.5 MCG/ACT inhaler Inhale 2 puffs into the lungs 2 (two) times daily.   calcitRIOL (ROCALTROL) 0.25 MCG capsule Take 0.25 mcg by mouth daily.   glucose blood test strip 1 each by Other route 2 (two) times daily. Use as instructed to check blood glucose twice daily   Insulin Pen Needle (DROPLET PEN NEEDLES) 31G X 5 MM MISC Use to inject insulin daily as directed   isosorbide mononitrate (IMDUR) 30 MG 24 hr tablet Take 1 tablet (30 mg total) by mouth daily.   LANTUS SOLOSTAR 100 UNIT/ML Solostar Pen INJECT  30 UNITS SUBCUTANEOUSLY AT BEDTIME (Patient taking differently: 30 Units at bedtime.)   losartan (COZAAR) 25 MG tablet Take 1 tablet (25 mg total) by mouth daily.   nitroGLYCERIN (NITROSTAT) 0.4 MG SL tablet Place 1 tablet (0.4 mg total) under the tongue every 5 (five) minutes as needed for chest pain.   pantoprazole (PROTONIX) 40 MG tablet Take 1 tablet (40 mg total) by mouth 2 (two) times daily before a meal.   sevelamer (RENAGEL) 800 MG tablet Take 2,400 mg by mouth 3 (three) times daily with meals.   simvastatin (ZOCOR) 20 MG tablet Take 20 mg by mouth every morning.   ticagrelor (BRILINTA) 90 MG TABS tablet Take 1 tablet (90 mg total) by mouth 2 (two) times daily.   torsemide (DEMADEX) 100 MG tablet Take 100 mg by mouth daily.  TRADJENTA 5 MG TABS tablet TAKE 1 TABLET EVERY DAY   Vitamin D, Ergocalciferol, (DRISDOL) 1.25 MG (50000 UNIT) CAPS capsule Take 50,000 Units by mouth every 7 (seven) days. Monday   No facility-administered encounter medications on file as of 01/05/2022.   SDOH:  (Social Determinants of Health) assessments and interventions performed:  SDOH Interventions    Flowsheet Row Most Recent Value  SDOH Interventions   Financial Strain Interventions Intervention Not Indicated   Housing Interventions Intervention Not Indicated  Physical Activity Interventions Intervention Not Indicated, Cardiac Rehab  [name] [name] is paused at this time to allow pt to recover from his NSTEMI]  Stress Interventions Intervention Not Indicated  Social Connections Interventions Intervention Not Indicated       Care Plan  Review of patient past medical history, allergies, medications, health status, including review of consultants reports, laboratory and other test data, was performed as part of comprehensive evaluation for care management services.   Care Plan : [facility] NP Plan of CARE  Updates made by Deloria Lair, NP since 12/29/2021 12:00 AM     Problem: Mutiple co-morbidities: CAD, ESRD, DM   Priority: High  Onset Date: 12/29/2021     Goal: Patient will not readmit in the next 30 days by pt and chart review.   Start Date: 12/29/2021  Expected End Date: 01/30/2022  Note:   Current Barriers:  Chronic Disease Management support and education needs related to CAD, DMII, and ESRD  RNCM Clinical Goal(s):  Patient will take all medications exactly as prescribed and will call provider for medication related questions as evidenced by pt report.    attend all scheduled medical appointments: cardiology, primary, endocrinology, renal as evidenced by pt report and chart review.        demonstrate improved adherence to prescribed treatment plan for CAD, DMII, and ESRD as evidenced by NO READMISSION, FOLLOWING DIET AND DAILY MONITORING  through collaboration with RN Care manager, provider, and care team.   Interventions: Inter-disciplinary care team collaboration (see longitudinal plan of care) Evaluation of current treatment plan related to  self management and patient's adherence to plan as established by provider  Patient Goals/Self-Care Activities: Take medications as prescribed   Attend all scheduled provider appointments Call pharmacy for medication refills 3-7 days in advance of  running out of medications Call provider office for new concerns or questions       Goal: Patient will check 4 pm glucose levels and all dietary intake on 3 days over the next week as evidenced by pt report.   Start Date: 12/29/2021  Expected End Date: 01/06/2022  Priority: Medium  Note:   Current Barriers:  Chronic Disease Management support and education needs related to CAD, DMII, and ESRD  RNCM Clinical Goal(s):  Patient will work with NP to establish carb knowledge and intake as evidenced by pt report  through collaboration with Consulting civil engineer, provider, and care team.   Interventions: Inter-disciplinary care team collaboration (see longitudinal plan of care) Evaluation of current treatment plan related to  self management and patient's adherence to plan as established by provider  Patient Goals/Self-Care Activities: Take medications as prescribed   Attend all scheduled provider appointments Call pharmacy for medication refills 3-7 days in advance of running out of medications Call provider office for new concerns or questions       Problem: Obesity (Class 2, BMI 39.17)   Priority: Low  Onset Date: 12/29/2021     Long-Range Goal: Pt will reduce carbs in an effort  to lose 10# over the next 3 months   Start Date: 12/29/2021  Expected End Date: 04/14/2022  Priority: Low  Note:   Current Barriers:  Chronic Disease Management support and education needs related to lose weight by carb counting and reduction.  RNCM Clinical Goal(s):  Patient will demonstrate improved adherence to prescribed treatment plan for WEIGHT REDUCTION as evidenced by pt report  through collaboration with RN Care manager, provider, and care team.   Interventions: Inter-disciplinary care team collaboration (see longitudinal plan of care) Evaluation of current treatment plan related to  self management and patient's adherence to plan as established by provider  Patient Goals/Self-Care  Activities: Perform all self care activities independently  Diet diary and carb counting        Plan: Telephone follow up appointment with care management team member scheduled for:  NEXT [place]. PT AGREES TO PLAN OF CARE.  [name] [name], MSN, [facility] Gerontological Nurse Practitioner [facility] Care Management [phone]

## 2021-12-30 DIAGNOSIS — D509 Iron deficiency anemia, unspecified: Secondary | ICD-10-CM | POA: Diagnosis not present

## 2021-12-30 DIAGNOSIS — D631 Anemia in chronic kidney disease: Secondary | ICD-10-CM | POA: Diagnosis not present

## 2021-12-30 DIAGNOSIS — Z992 Dependence on renal dialysis: Secondary | ICD-10-CM | POA: Diagnosis not present

## 2021-12-30 DIAGNOSIS — N186 End stage renal disease: Secondary | ICD-10-CM | POA: Diagnosis not present

## 2021-12-30 DIAGNOSIS — N250 Renal osteodystrophy: Secondary | ICD-10-CM | POA: Diagnosis not present

## 2021-12-31 DIAGNOSIS — D631 Anemia in chronic kidney disease: Secondary | ICD-10-CM | POA: Diagnosis not present

## 2021-12-31 DIAGNOSIS — N250 Renal osteodystrophy: Secondary | ICD-10-CM | POA: Diagnosis not present

## 2021-12-31 DIAGNOSIS — D509 Iron deficiency anemia, unspecified: Secondary | ICD-10-CM | POA: Diagnosis not present

## 2021-12-31 DIAGNOSIS — N186 End stage renal disease: Secondary | ICD-10-CM | POA: Diagnosis not present

## 2021-12-31 DIAGNOSIS — Z992 Dependence on renal dialysis: Secondary | ICD-10-CM | POA: Diagnosis not present

## 2022-01-01 DIAGNOSIS — D631 Anemia in chronic kidney disease: Secondary | ICD-10-CM | POA: Diagnosis not present

## 2022-01-01 DIAGNOSIS — D509 Iron deficiency anemia, unspecified: Secondary | ICD-10-CM | POA: Diagnosis not present

## 2022-01-01 DIAGNOSIS — N250 Renal osteodystrophy: Secondary | ICD-10-CM | POA: Diagnosis not present

## 2022-01-01 DIAGNOSIS — Z992 Dependence on renal dialysis: Secondary | ICD-10-CM | POA: Diagnosis not present

## 2022-01-01 DIAGNOSIS — N186 End stage renal disease: Secondary | ICD-10-CM | POA: Diagnosis not present

## 2022-01-02 DIAGNOSIS — D509 Iron deficiency anemia, unspecified: Secondary | ICD-10-CM | POA: Diagnosis not present

## 2022-01-02 DIAGNOSIS — Z992 Dependence on renal dialysis: Secondary | ICD-10-CM | POA: Diagnosis not present

## 2022-01-02 DIAGNOSIS — N250 Renal osteodystrophy: Secondary | ICD-10-CM | POA: Diagnosis not present

## 2022-01-02 DIAGNOSIS — D631 Anemia in chronic kidney disease: Secondary | ICD-10-CM | POA: Diagnosis not present

## 2022-01-02 DIAGNOSIS — N186 End stage renal disease: Secondary | ICD-10-CM | POA: Diagnosis not present

## 2022-01-03 DIAGNOSIS — D631 Anemia in chronic kidney disease: Secondary | ICD-10-CM | POA: Diagnosis not present

## 2022-01-03 DIAGNOSIS — E1165 Type 2 diabetes mellitus with hyperglycemia: Secondary | ICD-10-CM | POA: Diagnosis not present

## 2022-01-03 DIAGNOSIS — I441 Atrioventricular block, second degree: Secondary | ICD-10-CM | POA: Diagnosis not present

## 2022-01-03 DIAGNOSIS — Z992 Dependence on renal dialysis: Secondary | ICD-10-CM | POA: Diagnosis not present

## 2022-01-03 DIAGNOSIS — N186 End stage renal disease: Secondary | ICD-10-CM | POA: Diagnosis not present

## 2022-01-03 DIAGNOSIS — N250 Renal osteodystrophy: Secondary | ICD-10-CM | POA: Diagnosis not present

## 2022-01-03 DIAGNOSIS — I200 Unstable angina: Secondary | ICD-10-CM | POA: Diagnosis not present

## 2022-01-03 DIAGNOSIS — D509 Iron deficiency anemia, unspecified: Secondary | ICD-10-CM | POA: Diagnosis not present

## 2022-01-04 DIAGNOSIS — Z992 Dependence on renal dialysis: Secondary | ICD-10-CM | POA: Diagnosis not present

## 2022-01-04 DIAGNOSIS — N250 Renal osteodystrophy: Secondary | ICD-10-CM | POA: Diagnosis not present

## 2022-01-04 DIAGNOSIS — N186 End stage renal disease: Secondary | ICD-10-CM | POA: Diagnosis not present

## 2022-01-04 DIAGNOSIS — D509 Iron deficiency anemia, unspecified: Secondary | ICD-10-CM | POA: Diagnosis not present

## 2022-01-04 DIAGNOSIS — D631 Anemia in chronic kidney disease: Secondary | ICD-10-CM | POA: Diagnosis not present

## 2022-01-05 ENCOUNTER — Other Ambulatory Visit: Payer: Self-pay | Admitting: *Deleted

## 2022-01-05 DIAGNOSIS — Z992 Dependence on renal dialysis: Secondary | ICD-10-CM | POA: Diagnosis not present

## 2022-01-05 DIAGNOSIS — N186 End stage renal disease: Secondary | ICD-10-CM | POA: Diagnosis not present

## 2022-01-05 DIAGNOSIS — N250 Renal osteodystrophy: Secondary | ICD-10-CM | POA: Diagnosis not present

## 2022-01-05 DIAGNOSIS — D509 Iron deficiency anemia, unspecified: Secondary | ICD-10-CM | POA: Diagnosis not present

## 2022-01-05 DIAGNOSIS — D631 Anemia in chronic kidney disease: Secondary | ICD-10-CM | POA: Diagnosis not present

## 2022-01-05 NOTE — Patient Outreach (Signed)
[facility]) Care Management Geriatric Nurse Practitioner Note   01/05/2022 Late entry for telephone visit on 12/29/21 Name:  [name] [name] MRN:  [id number] DOB:  01/09/1951  Summary: [name] [name] pt. FOCUS: CAD, DM, ESRD  Recommendations/Changes made from today's visit: FOLLOW DM, HEART, RENAL DIET, ATTEND APPTS, CALL FOR PROBLEMS AND QUESTIONS.  Subjective: [name] [name] is an 71 y.o. year old male who is a primary patient of [name] [name], [name] Baxter, MD. The care management team was consulted for assistance with care management and/or care coordination needs.    Geriatric Nurse Practitioner completed Telephone Visit today.   Outpatient Encounter Medications as of 12/29/2021  Medication Sig   albuterol (VENTOLIN HFA) 108 (90 Base) MCG/ACT inhaler Inhale 2 puffs into the lungs every 6 (six) hours as needed for wheezing or shortness of breath.   aspirin EC 81 MG tablet Take 81 mg by mouth daily. Swallow whole.   budesonide-formoterol (SYMBICORT) 160-4.5 MCG/ACT inhaler Inhale 2 puffs into the lungs 2 (two) times daily.   calcitRIOL (ROCALTROL) 0.25 MCG capsule Take 0.25 mcg by mouth daily.   glucose blood test strip 1 each by Other route 2 (two) times daily. Use as instructed to check blood glucose twice daily   Insulin Pen Needle (DROPLET PEN NEEDLES) 31G X 5 MM MISC Use to inject insulin daily as directed   isosorbide mononitrate (IMDUR) 30 MG 24 hr tablet Take 1 tablet (30 mg total) by mouth daily.   LANTUS SOLOSTAR 100 UNIT/ML Solostar Pen INJECT  30 UNITS SUBCUTANEOUSLY AT BEDTIME (Patient taking differently: 30 Units at bedtime.)   losartan (COZAAR) 25 MG tablet Take 1 tablet (25 mg total) by mouth daily.   nitroGLYCERIN (NITROSTAT) 0.4 MG SL tablet Place 1 tablet (0.4 mg total) under the tongue every 5 (five) minutes as needed for chest pain.   pantoprazole (PROTONIX) 40 MG tablet Take 1 tablet (40 mg total) by mouth 2 (two) times daily before a meal.   sevelamer  (RENAGEL) 800 MG tablet Take 2,400 mg by mouth 3 (three) times daily with meals.   simvastatin (ZOCOR) 20 MG tablet Take 20 mg by mouth every morning.   ticagrelor (BRILINTA) 90 MG TABS tablet Take 1 tablet (90 mg total) by mouth 2 (two) times daily.   torsemide (DEMADEX) 100 MG tablet Take 100 mg by mouth daily.   TRADJENTA 5 MG TABS tablet TAKE 1 TABLET EVERY DAY   Vitamin D, Ergocalciferol, (DRISDOL) 1.25 MG (50000 UNIT) CAPS capsule Take 50,000 Units by mouth every 7 (seven) days. Monday   No facility-administered encounter medications on file as of 12/29/2021.   SDOH:  (Social Determinants of Health) assessments and interventions performed:  SDOH Interventions    Flowsheet Row Most Recent Value  SDOH Interventions   Food Insecurity Interventions Intervention Not Indicated  Transportation Interventions Intervention Not Indicated      Care Plan  Review of patient past medical history, allergies, medications, health status, including review of consultants reports, laboratory and other test data, was performed as part of comprehensive evaluation for care management services.   Multiple co-morbidities: CAD, ESRD, DM: Patient will check 4 pm glucose levels and all dietary intake on 3 days over the next week as evidenced by pt report.  Obesity (Class 2, BMI 39.17) Pt will reduce carbs in an effort to lose 10# over the next 3 months  Plan: Telephone follow up appointment with care management team member scheduled for:  June 22. Pt agrees to the  plan of care.  [name] [name]. [name] [name] Neither, MSN, [facility] Gerontological Nurse Practitioner [facility] Care Management [phone]

## 2022-01-05 NOTE — Patient Outreach (Signed)
[facility]) Care Management Geriatric Nurse Practitioner Note   01/05/2022 Name:  [name] [name] MRN:  [id number] DOB:  [birth year]  Summary: Continues to recover from his NSTEMI and angioplasty. Very good glucose control and learning carb counting for glucose and wt control.  Recommendations/Changes made from today's visit: Continue current goals. Figure out carb intake of each meal.  Subjective: [name] [name] is an 71 y.o. year old male who is a primary patient of [name], [name], MD. The care management team was consulted for assistance with care management and/or care coordination needs.    Geriatric Nurse Practitioner completed Telephone Visit today.   Outpatient Encounter Medications as of 01/05/2022  Medication Sig   albuterol (VENTOLIN HFA) 108 (90 Base) MCG/ACT inhaler Inhale 2 puffs into the lungs every 6 (six) hours as needed for wheezing or shortness of breath.   aspirin EC 81 MG tablet Take 81 mg by mouth daily. Swallow whole.   budesonide-formoterol (SYMBICORT) 160-4.5 MCG/ACT inhaler Inhale 2 puffs into the lungs 2 (two) times daily.   calcitRIOL (ROCALTROL) 0.25 MCG capsule Take 0.25 mcg by mouth daily.   glucose blood test strip 1 each by Other route 2 (two) times daily. Use as instructed to check blood glucose twice daily   Insulin Pen Needle (DROPLET PEN NEEDLES) 31G X 5 MM MISC Use to inject insulin daily as directed   isosorbide mononitrate (IMDUR) 30 MG 24 hr tablet Take 1 tablet (30 mg total) by mouth daily.   LANTUS SOLOSTAR 100 UNIT/ML Solostar Pen INJECT  30 UNITS SUBCUTANEOUSLY AT BEDTIME (Patient taking differently: 30 Units at bedtime.)   losartan (COZAAR) 25 MG tablet Take 1 tablet (25 mg total) by mouth daily.   nitroGLYCERIN (NITROSTAT) 0.4 MG SL tablet Place 1 tablet (0.4 mg total) under the tongue every 5 (five) minutes as needed for chest pain.   pantoprazole (PROTONIX) 40 MG tablet Take 1 tablet (40 mg total) by mouth 2 (two)  times daily before a meal.   sevelamer (RENAGEL) 800 MG tablet Take 2,400 mg by mouth 3 (three) times daily with meals.   simvastatin (ZOCOR) 20 MG tablet Take 20 mg by mouth every morning.   ticagrelor (BRILINTA) 90 MG TABS tablet Take 1 tablet (90 mg total) by mouth 2 (two) times daily.   torsemide (DEMADEX) 100 MG tablet Take 100 mg by mouth daily.   TRADJENTA 5 MG TABS tablet TAKE 1 TABLET EVERY DAY   Vitamin D, Ergocalciferol, (DRISDOL) 1.25 MG (50000 UNIT) CAPS capsule Take 50,000 Units by mouth every 7 (seven) days. Monday   No facility-administered encounter medications on file as of 01/05/2022.   SDOH:  (Social Determinants of Health) assessments and interventions performed:  SDOH Interventions    Flowsheet Row Most Recent Value  SDOH Interventions   Financial Strain Interventions Intervention Not Indicated  Housing Interventions Intervention Not Indicated  Physical Activity Interventions Intervention Not Indicated, Cardiac Rehab  [name] [name] is paused at this time to allow pt to recover from his NSTEMI]  Stress Interventions Intervention Not Indicated  Social Connections Interventions Intervention Not Indicated      Care Plan  Review of patient past medical history, allergies, medications, health status, including review of consultants reports, laboratory and other test data, was performed as part of comprehensive evaluation for care management services.   Care Plan : [facility] NP Plan of CARE  Updates made by Deloria Lair, NP since 01/05/2022 12:00 AM     Problem: Mutiple co-morbidities: CAD, ESRD,  DM   Priority: High  Onset Date: 12/29/2021     Goal: Patient will not readmit in the next 30 days by pt and chart review.   Start Date: 12/29/2021  Expected End Date: 01/30/2022  This Visit's Progress: On track  Priority: High  Note:    Update 01/05/22 :  (Status: Goal on Track (progressing): YES.) Short Term Goal  Evaluation of current treatment plan related to READMISSION and  patient's adherence to plan as established by provider No readmission this week. He saw his primary care MD for follow up and attended his monthly dialysis meeting (does PD at home).  Current Barriers:  Chronic Disease Management support and education needs related to CAD, DMII, and ESRD  RNCM Clinical Goal(s):  Patient will take all medications exactly as prescribed and will call provider for medication related questions as evidenced by pt report.    attend all scheduled medical appointments: cardiology, primary, endocrinology, renal as evidenced by pt report and chart review.        demonstrate improved adherence to prescribed treatment plan for CAD, DMII, and ESRD as evidenced by NO READMISSION, FOLLOWING DIET AND DAILY MONITORING  through collaboration with RN Care manager, provider, and care team.   Interventions: Inter-disciplinary care team collaboration (see longitudinal plan of care) Evaluation of current treatment plan related to  self management and patient's adherence to plan as established by provider  Patient Goals/Self-Care Activities: Take medications as prescribed   Attend all scheduled provider appointments Call pharmacy for medication refills 3-7 days in advance of running out of medications Call provider office for new concerns or questions       Goal: Patient will check 4 pm glucose levels and all dietary intake on 3 days over the next week as evidenced by pt report.   Start Date: 12/29/2021  Expected End Date: 01/06/2022  This Visit's Progress: On track  Priority: Medium  Note:    Update 01/05/22:  (Status: Goal Met.) Short Term Goal  Evaluation of current treatment plan related to CHECKING 4 PM GLUCOSE LEVELS AND RECORDING DIETARY INTAKE  and patient's adherence to plan as established by provider Mr. [name] is in exemplary patient in that he did exactly what was asked of him. He checked his afternoon glucose levels and they were all between 100-120!!!! He  recorded his meals. We discussed what he eat in each meal. Identified the carbs, looked up serving sizes and calculated total carbs.  He is doing great on keeping his carb level down so that he can lose the wt he needs to also.   Current Barriers:  Chronic Disease Management support and education needs related to CAD, DMII, and ESRD  RNCM Clinical Goal(s):  Patient will work with NP to establish carb knowledge and intake as evidenced by pt report  through collaboration with Consulting civil engineer, provider, and care team.   Interventions: Inter-disciplinary care team collaboration (see longitudinal plan of care) Evaluation of current treatment plan related to  self management and patient's adherence to plan as established by provider  Patient Goals/Self-Care Activities: Take medications as prescribed   Attend all scheduled provider appointments Call pharmacy for medication refills 3-7 days in advance of running out of medications Call provider office for new concerns or questions       Problem: Obesity (Class 2, BMI 39.17)   Priority: Low  Onset Date: 12/29/2021     Long-Range Goal: Pt will reduce carbs in an effort to lose 10# over the next 3  months. (Starting wt 228#)   Start Date: 12/29/2021  Expected End Date: 04/14/2022  This Visit's Progress: On track  Priority: Medium  Note:    Update 01/05/22 (Status: Goal on Track (progressing): YES.) Long Term Goal  Evaluation of current treatment plan related to REDUCING CARBS FOR WT LOSS and patient's adherence to plan as established by provider Pt has received his carb education in the mail. He has glanced at it. He recorded several days meals and today we calculated his carb intake. He is already on tract with that and will not have to make many adjustments. His assignment for this week is to record his meal intake and figure out how many carbs he has had in each meal and tell NP on next call.  Current Barriers:  Chronic Disease Management  support and education needs related to lose weight by carb counting and reduction.  RNCM Clinical Goal(s):  Patient will demonstrate improved adherence to prescribed treatment plan for WEIGHT REDUCTION as evidenced by pt report  through collaboration with RN Care manager, provider, and care team.   Interventions: Inter-disciplinary care team collaboration (see longitudinal plan of care) Evaluation of current treatment plan related to  self management and patient's adherence to plan as established by provider  Patient Goals/Self-Care Activities: Perform all self care activities independently  Diet diary and carb counting        Plan: Telephone follow up appointment with care management team member scheduled for:  One week. Pt  agrees to the plan of care.  [name] [name], MSN, [facility] Gerontological Nurse Practitioner [facility] Care Management [phone]

## 2022-01-06 DIAGNOSIS — N250 Renal osteodystrophy: Secondary | ICD-10-CM | POA: Diagnosis not present

## 2022-01-06 DIAGNOSIS — Z992 Dependence on renal dialysis: Secondary | ICD-10-CM | POA: Diagnosis not present

## 2022-01-06 DIAGNOSIS — N186 End stage renal disease: Secondary | ICD-10-CM | POA: Diagnosis not present

## 2022-01-06 DIAGNOSIS — D631 Anemia in chronic kidney disease: Secondary | ICD-10-CM | POA: Diagnosis not present

## 2022-01-06 DIAGNOSIS — D509 Iron deficiency anemia, unspecified: Secondary | ICD-10-CM | POA: Diagnosis not present

## 2022-01-07 DIAGNOSIS — D509 Iron deficiency anemia, unspecified: Secondary | ICD-10-CM | POA: Diagnosis not present

## 2022-01-07 DIAGNOSIS — Z992 Dependence on renal dialysis: Secondary | ICD-10-CM | POA: Diagnosis not present

## 2022-01-07 DIAGNOSIS — N186 End stage renal disease: Secondary | ICD-10-CM | POA: Diagnosis not present

## 2022-01-07 DIAGNOSIS — N250 Renal osteodystrophy: Secondary | ICD-10-CM | POA: Diagnosis not present

## 2022-01-07 DIAGNOSIS — D631 Anemia in chronic kidney disease: Secondary | ICD-10-CM | POA: Diagnosis not present

## 2022-01-08 DIAGNOSIS — N250 Renal osteodystrophy: Secondary | ICD-10-CM | POA: Diagnosis not present

## 2022-01-08 DIAGNOSIS — D509 Iron deficiency anemia, unspecified: Secondary | ICD-10-CM | POA: Diagnosis not present

## 2022-01-08 DIAGNOSIS — D631 Anemia in chronic kidney disease: Secondary | ICD-10-CM | POA: Diagnosis not present

## 2022-01-08 DIAGNOSIS — N186 End stage renal disease: Secondary | ICD-10-CM | POA: Diagnosis not present

## 2022-01-08 DIAGNOSIS — Z992 Dependence on renal dialysis: Secondary | ICD-10-CM | POA: Diagnosis not present

## 2022-01-09 DIAGNOSIS — N250 Renal osteodystrophy: Secondary | ICD-10-CM | POA: Diagnosis not present

## 2022-01-09 DIAGNOSIS — D509 Iron deficiency anemia, unspecified: Secondary | ICD-10-CM | POA: Diagnosis not present

## 2022-01-09 DIAGNOSIS — N186 End stage renal disease: Secondary | ICD-10-CM | POA: Diagnosis not present

## 2022-01-09 DIAGNOSIS — Z992 Dependence on renal dialysis: Secondary | ICD-10-CM | POA: Diagnosis not present

## 2022-01-09 DIAGNOSIS — D631 Anemia in chronic kidney disease: Secondary | ICD-10-CM | POA: Diagnosis not present

## 2022-01-10 ENCOUNTER — Encounter: Payer: Self-pay | Admitting: Nurse Practitioner

## 2022-01-10 ENCOUNTER — Ambulatory Visit (INDEPENDENT_AMBULATORY_CARE_PROVIDER_SITE_OTHER): Payer: Medicare Other | Admitting: Nurse Practitioner

## 2022-01-10 VITALS — BP 117/66 | HR 76 | Ht 64.0 in | Wt 221.0 lb

## 2022-01-10 DIAGNOSIS — E1122 Type 2 diabetes mellitus with diabetic chronic kidney disease: Secondary | ICD-10-CM | POA: Diagnosis not present

## 2022-01-10 DIAGNOSIS — D631 Anemia in chronic kidney disease: Secondary | ICD-10-CM | POA: Diagnosis not present

## 2022-01-10 DIAGNOSIS — N250 Renal osteodystrophy: Secondary | ICD-10-CM | POA: Diagnosis not present

## 2022-01-10 DIAGNOSIS — Z992 Dependence on renal dialysis: Secondary | ICD-10-CM | POA: Diagnosis not present

## 2022-01-10 DIAGNOSIS — N185 Chronic kidney disease, stage 5: Secondary | ICD-10-CM

## 2022-01-10 DIAGNOSIS — E782 Mixed hyperlipidemia: Secondary | ICD-10-CM

## 2022-01-10 DIAGNOSIS — D509 Iron deficiency anemia, unspecified: Secondary | ICD-10-CM | POA: Diagnosis not present

## 2022-01-10 DIAGNOSIS — I10 Essential (primary) hypertension: Secondary | ICD-10-CM | POA: Diagnosis not present

## 2022-01-10 DIAGNOSIS — N186 End stage renal disease: Secondary | ICD-10-CM | POA: Diagnosis not present

## 2022-01-10 MED ORDER — LANTUS SOLOSTAR 100 UNIT/ML ~~LOC~~ SOPN: 22.0000 [IU] | PEN_INJECTOR | Freq: Every day | SUBCUTANEOUS | 1 refills | Status: DC

## 2022-01-11 ENCOUNTER — Ambulatory Visit: Payer: Medicare Other | Admitting: Physician Assistant

## 2022-01-11 ENCOUNTER — Ambulatory Visit (INDEPENDENT_AMBULATORY_CARE_PROVIDER_SITE_OTHER): Payer: Medicare Other | Admitting: Physician Assistant

## 2022-01-11 ENCOUNTER — Encounter: Payer: Self-pay | Admitting: Vascular Surgery

## 2022-01-11 ENCOUNTER — Ambulatory Visit (INDEPENDENT_AMBULATORY_CARE_PROVIDER_SITE_OTHER): Payer: Medicare Other | Admitting: Vascular Surgery

## 2022-01-11 ENCOUNTER — Encounter: Payer: Self-pay | Admitting: Physician Assistant

## 2022-01-11 VITALS — BP 124/64 | HR 68 | Ht 64.0 in | Wt 224.8 lb

## 2022-01-11 VITALS — BP 112/66 | HR 44 | Temp 96.6°F | Resp 16 | Ht 64.0 in | Wt 224.2 lb

## 2022-01-11 DIAGNOSIS — I2111 ST elevation (STEMI) myocardial infarction involving right coronary artery: Secondary | ICD-10-CM | POA: Diagnosis not present

## 2022-01-11 DIAGNOSIS — N186 End stage renal disease: Secondary | ICD-10-CM

## 2022-01-11 DIAGNOSIS — Z992 Dependence on renal dialysis: Secondary | ICD-10-CM | POA: Diagnosis not present

## 2022-01-11 DIAGNOSIS — I441 Atrioventricular block, second degree: Secondary | ICD-10-CM

## 2022-01-11 DIAGNOSIS — N250 Renal osteodystrophy: Secondary | ICD-10-CM | POA: Diagnosis not present

## 2022-01-11 DIAGNOSIS — E785 Hyperlipidemia, unspecified: Secondary | ICD-10-CM | POA: Diagnosis not present

## 2022-01-11 DIAGNOSIS — R079 Chest pain, unspecified: Secondary | ICD-10-CM

## 2022-01-11 DIAGNOSIS — D509 Iron deficiency anemia, unspecified: Secondary | ICD-10-CM | POA: Diagnosis not present

## 2022-01-11 DIAGNOSIS — I25119 Atherosclerotic heart disease of native coronary artery with unspecified angina pectoris: Secondary | ICD-10-CM | POA: Diagnosis not present

## 2022-01-11 DIAGNOSIS — D631 Anemia in chronic kidney disease: Secondary | ICD-10-CM | POA: Diagnosis not present

## 2022-01-11 DIAGNOSIS — I10 Essential (primary) hypertension: Secondary | ICD-10-CM | POA: Diagnosis not present

## 2022-01-11 MED ORDER — ISOSORBIDE MONONITRATE ER 60 MG PO TB24: 60.0000 mg | ORAL_TABLET | Freq: Every day | ORAL | 3 refills | Status: DC

## 2022-01-11 NOTE — Progress Notes (Signed)
Cardiology Office Note:    Date:  01/13/2022   ID:  [name], DOB 24-Jul-1950, MRN [id number]  PCP:  Carrolyn Meiers, MD   [facility] Providers Cardiologist:  Pixie Casino, MD     Referring MD: Carrolyn Meiers*   Chief Complaint  Patient presents with   Follow-up    Seen for Dr. Debara Pickett    History of Present Illness:    [name] [name] is a 71 y.o. male with a hx of hypertension, hyperlipidemia, CAD, IDDM, history of polysubstance abuse with cocaine, end-stage renal disease and obstructive sleep apnea.  Patient had initial cardiac catheterization in September 2014 following abnormal Myoview that revealed minimal CAD with no significant disease.  He also had NSTEMI in 2013 treated with drug-eluting stent to proximal RCA.  Patient was seen in the ED in February 2020 with complaint of left-sided chest pain.  ACS was ruled out, echocardiogram at the time showed EF 50 to 55%, severely increased left ventricular wall, moderately dilated RA and LA.  Nuclear stress test was completed that revealed low risk study.  Patient presented to the hospital again in January 2023 and was ruled in for NSTEMI.  Left heart cath revealed severe RCA disease treated with drug-eluting stent and angioplasty.  He was placed on aspirin and Brilinta for 1 year.  2D echo completed after the catheter showing EF 55 to 60%, mild MR.  He was also tested positive for COVID-19.  Patient was last seen by Ambrose Pancoast, NP on 12/22/2021 for chest pain.  He was having symptoms associated with unstable angina.  Imdur 30 mg was started.  Repeat cardiac catheterization on 12/26/2021 demonstrated severe single-vessel CAD with sequential 90-95% mid RCA stenosis at the site of angioplasty in January, previously placed RCA stent demonstrated mild to moderate in-stent restenosis in the distal third, there was also 80% distal RPDA lesion, patient underwent successful DES x2 to mid RCA.  It was recommended patient continue  dual antiplatelet therapy indefinitely.  Follow-up echocardiogram showed EF 60 to 65%, no regional wall motion abnormality.  Patient did have second-degree AV block, however remained asymptomatic.  Beta-blocker was discontinued during recent visit.  Due to newly diagnosed second-degree AV block, as outpatient 14-day heart monitor was recommended.  Since discharge from the hospital, his chest pain has significantly improved.  He still has a slight burning sensation in the chest with physical activity.  I recommended increasing Imdur to 60 mg daily.  He is currently on peritoneal dialysis.  He appears to be euvolemic on physical exam.  He is about to finish wearing the heart monitor, we will likely receive the final report in 2 weeks.  EKG today continue to show Mobitz type I heart block.  Patient denies any dizziness, blurred vision or feeling of passing out.  He can follow-up in 4 weeks to review heart monitor result and follow-up with Dr. Debara Pickett in 4 months.  Past Medical History:  Diagnosis Date   Anemia    Arthritis    Asthma    Bell palsy    CAD (coronary artery disease)    a. 2014 MV: abnl w/ infap ischemia; b. 03/2013 Cath: aneurysmal bleb in the LAD w/ otw nonobs dzs-->Med Rx.   Chronic back pain    Chronic knee pain    a. 09/2015 s/p R TKA.   Chronic pain    Chronic shoulder pain    Chronic sinusitis    COPD (chronic obstructive pulmonary disease) ([place])  Diabetes mellitus without complication ([place])    type II    ESRD on peritoneal dialysis ([place])    on peritoneal dialysis, [facility]   Essential hypertension    GERD (gastroesophageal reflux disease)    Gout    Gout    Hepatomegaly    noted on noncontrast CT 2015   History of hiatal hernia    Hyperlipidemia    Lateral meniscus tear    Obesity    Truncal   Obstructive sleep apnea    does not use cpap    On home oxygen therapy    uses 2l when is going somewhere per patient    PUD (peptic ulcer disease)    remote,  reports f/u EGD about 8 years ago unremarkable    Reactive airway disease    related to exposure to chemical during 9/11   Sinusitis    Vitamin D deficiency     Past Surgical History:  Procedure Laterality Date   ASAD LT SHOULDER  12/15/2008   left shoulder   AV FISTULA PLACEMENT Left 08/09/2016   Procedure: BRACHIOCEPHALIC ARTERIOVENOUS (AV) FISTULA CREATION LEFT ARM;  Surgeon: [name], MD;  Location: [facility] OR;  Service: Vascular;  Laterality: Left;   CAPD INSERTION N/A 10/07/2018   Procedure: LAPAROSCOPIC PERITONEAL CATHETER PLACEMENT;  Surgeon: Mickeal Skinner, MD;  Location: [facility]S;  Service: General;  Laterality: N/A;   CATARACT EXTRACTION W/PHACO Left 03/28/2016   Procedure: CATARACT EXTRACTION PHACO AND INTRAOCULAR LENS PLACEMENT LEFT EYE;  Surgeon: [name], MD;  Location: [facility];  Service: Ophthalmology;  Laterality: Left;  CDE: 4.77   CATARACT EXTRACTION W/PHACO Right 04/11/2016   Procedure: CATARACT EXTRACTION PHACO AND INTRAOCULAR LENS PLACEMENT RIGHT EYE; CDE:  4.74;  Surgeon: [name], MD;  Location: [facility];  Service: Ophthalmology;  Laterality: Right;   COLONOSCOPY  10/15/2008   Fields: Rectal polyp obliterated, not retrieved, hemorrhoids, single ascending colon diverticulum near the CV. Next colonoscopy April 2020   COLONOSCOPY N/A 12/25/2014   SLF: 1. Colorectal polyps (2) removed 2. Small internal hemorrhoids 3. the left colon is severely redundant. hyperplastic polyps   CORONARY STENT INTERVENTION N/A 07/25/2021   Procedure: CORONARY STENT INTERVENTION;  Surgeon: Sherren Mocha, MD;  Location: [place] CV LAB;  Service: Cardiovascular;  Laterality: N/A;   CORONARY STENT INTERVENTION N/A 12/26/2021   Procedure: CORONARY STENT INTERVENTION;  Surgeon: Nelva Bush, MD;  Location: [place] CV LAB;  Service: Cardiovascular;  Laterality: N/A;   DOPPLER ECHOCARDIOGRAPHY     ESOPHAGOGASTRODUODENOSCOPY N/A 12/25/2014   SLF: 1. Anemia most likely due  to CRI, gastritis, gastric polyps 2. Moderate non-erosive gastriits and mild duodenitis.  3.TWo large gstric polyps removed.    EYE SURGERY  12/22/2010   tear duct probing-[place]   FOREIGN BODY REMOVAL  03/29/2011   Procedure: REMOVAL FOREIGN BODY EXTREMITY;  Surgeon: Arther Abbott, MD;  Location: [facility];  Service: Orthopedics;  Laterality: Right;  Removal Foreign Body Right Thumb   IR FLUORO GUIDE CV LINE RIGHT  08/06/2018   IR US GUIDE VASC ACCESS RIGHT  08/06/2018   KNEE ARTHROSCOPY  10/16/2007   left   KNEE ARTHROSCOPY WITH LATERAL MENISECTOMY Right 10/14/2015   Procedure: LEFT KNEE ARTHROSCOPY WITH PARTIAL LATERAL MENISECTOMY;  Surgeon: Carole Civil, MD;  Location: [facility];  Service: Orthopedics;  Laterality: Right;   LEFT HEART CATH AND CORONARY ANGIOGRAPHY N/A 07/25/2021   Procedure: LEFT HEART CATH AND CORONARY ANGIOGRAPHY;  Surgeon: Sherren Mocha, MD;  Location:  [place] [facility];  Service: Cardiovascular;  Laterality: N/A;   LEFT HEART CATH AND CORONARY ANGIOGRAPHY N/A 12/26/2021   Procedure: LEFT HEART CATH AND CORONARY ANGIOGRAPHY;  Surgeon: Nelva Bush, MD;  Location: [place] [facility] LAB;  Service: Cardiovascular;  Laterality: N/A;   LEFT HEART CATHETERIZATION WITH CORONARY ANGIOGRAM N/A 03/28/2013   Procedure: LEFT HEART CATHETERIZATION WITH CORONARY ANGIOGRAM;  Surgeon: Leonie Man, MD;  Location[facility] C[facility] LAB;  Service: Cardiovascular;  Laterality: N/A;   NM MYOVIEW LTD     PENILE PROSTHESIS IMPLANT N/A 08/16/2015   Procedure: PENILE PROTHESIS INFLATABLE, three piece, Excisional biopsy of Penile ulcer, Penile molding;  Surgeon: Carolan Clines, MD;  Location: [facility];  Service: Urology;  Laterality: N/A;   PENILE PROSTHESIS IMPLANT N/A 12/24/2017   Procedure: REMOVAL AND  REPLACEMENT  COLOPLAST PENILE PROSTHESIS;  Surgeon: Lucas Mallow, MD;  Location: [facility];  Service: Urology;  Laterality: N/A;   QUADRICEPS TENDON REPAIR  07/21/2011   Procedure: REPAIR  QUADRICEP TENDON;  Surgeon: Arther Abbott, MD;  Location: [facility];  Service: Orthopedics;  Laterality: Right;   TOENAIL EXCISION     removed [id number]   UMBILICAL HERNIA REPAIR  07/17/2005   [place]    Current Medications: Current Meds  Medication Sig   albuterol (VENTOLIN HFA) 108 (90 Base) MCG/ACT inhaler Inhale 2 puffs into the lungs every 6 (six) hours as needed for wheezing or shortness of breath.   aspirin EC 81 MG tablet Take 81 mg by mouth daily. Swallow whole.   budesonide-formoterol (SYMBICORT) 160-4.5 MCG/ACT inhaler Inhale 2 puffs into the lungs 2 (two) times daily.   calcitRIOL (ROCALTROL) 0.25 MCG capsule Take 0.25 mcg by mouth daily.   glucose blood test strip 1 each by Other route 2 (two) times daily. Use as instructed to check blood glucose twice daily   insulin glargine (LANTUS SOLOSTAR) 100 UNIT/ML Solostar Pen Inject 22 Units into the skin at bedtime.   Insulin Pen Needle (DROPLET PEN NEEDLES) 31G X 5 MM MISC Use to inject insulin daily as directed   losartan (COZAAR) 25 MG tablet Take 1 tablet (25 mg total) by mouth daily.   nitroGLYCERIN (NITROSTAT) 0.4 MG SL tablet Place 1 tablet (0.4 mg total) under the tongue every 5 (five) minutes as needed for chest pain.   pantoprazole (PROTONIX) 40 MG tablet Take 1 tablet (40 mg total) by mouth 2 (two) times daily before a meal.   sevelamer (RENAGEL) 800 MG tablet Take 2,400 mg by mouth 3 (three) times daily with meals.   simvastatin (ZOCOR) 20 MG tablet Take 20 mg by mouth every morning.   ticagrelor (BRILINTA) 90 MG TABS tablet Take 1 tablet (90 mg total) by mouth 2 (two) times daily.   torsemide (DEMADEX) 100 MG tablet Take 100 mg by mouth daily.   TRADJENTA 5 MG TABS tablet TAKE 1 TABLET EVERY DAY   Vitamin D, Ergocalciferol, (DRISDOL) 1.25 MG (50000 UNIT) CAPS capsule Take 50,000 Units by mouth every 7 (seven) days. Monday   [DISCONTINUED] isosorbide mononitrate (IMDUR) 30 MG 24 hr tablet Take 1 tablet (30 mg total) by  mouth daily.     Allergies:   Opana [oxymorphone hcl], Oxymorphone, and Tramadol   Social History   Socioeconomic History   Marital status: Married    Spouse name: Not on file   Number of children: 2   Years of education: 12th grade   Highest education level: Not on file  Occupational History   Occupation:  disabled   Occupation:      Fish farm manager: UNEMPLOYED  Tobacco Use   Smoking status: Former    Packs/day: 1.00    Years: 25.00    Total pack years: 25.00    Types: Cigarettes    Quit date: 03/27/2010    Years since quitting: 11.8   Smokeless tobacco: Never   Tobacco comments:    Quit x 7 years  Vaping Use   Vaping Use: Never used  Substance and Sexual Activity   Alcohol use: Not Currently    Comment: occasionally   Drug use: Yes    Types: Marijuana    Comment: cocaine- last time used- 11/24/2017 , marijuana-    Sexual activity: Yes  Other Topics Concern   Not on file  Social History Narrative   He quit smoking in 2010. 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 He developed pulmonary problems, became disabled because of lower airway disease in 2009.       WATCHES BASKETBALL. HIS TEAM IS [employer].   Social Determinants of Health   Financial Resource Strain: Low Risk  (12/29/2021)   Overall Financial Resource Strain (CARDIA)    Difficulty of Paying Living Expenses: Not hard at all  Food Insecurity: No Food Insecurity (12/29/2021)   Hunger Vital Sign    Worried About Running Out of Food in the Last Year: Never true    Ran Out of Food in the Last Year: Never true  Transportation Needs: No Transportation Needs (12/29/2021)   PRAPARE - Hydrologist (Medical): No    Lack of Transportation (Non-Medical): No  Physical Activity: Sufficiently Active (12/29/2021)   Exercise Vital Sign    Days of Exercise per Week: 3 days    Minutes of Exercise per Session: 60 min  Stress: No Stress Concern Present (12/29/2021)    [place]    Feeling of Stress : Not at all  Social Connections: [place] (12/29/2021)   Social Connection and Isolation Panel [NHANES]    Frequency of Communication with Friends and Family: More than three times a week    Frequency of Social Gatherings with Friends and Family: More than three times a week    Attends Religious Services: More than 4 times per year    Active Member of [employer] or Organizations: Yes    Attends Music therapist: More than 4 times per year    Marital Status: Married     Family History: The patient's family history includes Arthritis in an other family member; Asthma in an other family member; Cancer in his mother; Diabetes in his father, mother, and sister; Hypertension in his father, mother, and sister; Lung disease in an other family member. There is no history of Anesthesia problems, Hypotension, Malignant hyperthermia, Pseudochol deficiency, or Colon cancer.  ROS:   Please see the history of present illness.     All other systems reviewed and are negative.  EKGs/Labs/Other Studies Reviewed:    The following studies were reviewed today:  Echo 12/27/2021  1. Left ventricular ejection fraction, by estimation, is 60 to 65%. The  left ventricle has normal function. The left ventricle has no regional  wall motion abnormalities. There is mild left ventricular hypertrophy.  Left ventricular diastolic parameters  are indeterminate.   2. Right ventricular systolic function is normal. The right ventricular  size is normal. There is normal pulmonary artery systolic pressure. The  estimated right ventricular systolic pressure is 36.6 mmHg.   3. Left atrial size was mildly dilated.   4. Right atrial size was mildly dilated.   5. The mitral valve is degenerative. Trivial mitral valve regurgitation.  No evidence of mitral stenosis. Moderate mitral annular calcification.   6. The  aortic valve is tricuspid. Aortic valve regurgitation is mild.  Mild to moderate aortic valve stenosis. Vmax 2.8 m/s, MG 79mHg, AVA 1.5  cm^2, DI 0.46   7. The inferior vena cava is normal in size with greater than 50%  respiratory variability, suggesting right atrial pressure of 3 mmHg.   EKG:  EKG is ordered today.  The ekg ordered today demonstrates normal sinus rhythm, LBBB, Mobitz 1 AV block.  Recent Labs: 07/29/2021: B Natriuretic Peptide 154.0; Magnesium 2.4; TSH 3.386 08/14/2021: ALT 21 12/27/2021: BUN 52; Creatinine, Ser 17.44; Hemoglobin 9.3; Platelets 109; Potassium 3.6; Sodium 133  Recent Lipid Panel    Component Value Date/Time   CHOL 99 11/23/2021 1330   CHOL 104 06/10/2021 1337   TRIG 148 11/23/2021 1330   HDL 29 (L) 11/23/2021 1330   HDL 30 (L) 06/10/2021 1337   CHOLHDL 3.4 11/23/2021 1330   VLDL 38 (H) 06/16/2016 0956   LDLCALC 47 11/23/2021 1330     Risk Assessment/Calculations:           Physical Exam:    VS:  BP 124/64   Pulse 68   Ht '5\' 4"'$  (1.626 m)   Wt 224 lb 12.8 oz (102 kg)   SpO2 95%   BMI 38.59 kg/m     Wt Readings from Last 3 Encounters:  01/11/22 224 lb 12.8 oz (102 kg)  01/11/22 224 lb 3.2 oz (101.7 kg)  01/10/22 221 lb (100.2 kg)     GEN:  Well nourished, well developed in no acute distress HEENT: Normal NECK: No JVD; No carotid bruits LYMPHATICS: No lymphadenopathy CARDIAC: RRR, no murmurs, rubs, gallops RESPIRATORY:  Clear to auscultation without rales, wheezing or rhonchi  ABDOMEN: Soft, non-tender, non-distended MUSCULOSKELETAL:  No edema; No deformity  SKIN: Warm and dry NEUROLOGIC:  Alert and oriented x 3 PSYCHIATRIC:  Normal affect   ASSESSMENT:    1. Coronary artery disease involving native coronary artery of native heart with angina pectoris ([place]   2. Primary hypertension   3. Hyperlipidemia LDL goal <70   4. [place]D [place] ([place]   5. Second degree AV block, Mobitz type I    PLAN:    In order of  problems listed above:  CAD: Patient recently underwent DES x2 to mid RCA.  It is recommended to continue dual antiplatelet therapy for at least 12 months but ideally indefinitely.  Since cardiac catheterization, his chest pain has significantly improved, he still have a mild burning sensation that occurs with more strenuous activity but not with every day activity.  I recommended increasing Imdur to 60 mg daily  Hypertension: Blood pressure stable.  Increasing Imdur to 60 mg daily for antianginal purposes  Hyperlipidemia: On Zocor  End-stage renal disease on peritoneal dialysis: Managed by nephrology service  Mobitz 1 heart block: Seen during hospitalization, currently wearing a heart monitor.  Will return in 4 weeks to follow-up on the result of the heart monitor.    Cardiac Rehabilitation Eligibility Assessment  The patient is ready to start cardiac rehabilitation from a cardiac standpoint.          Medication Adjustments/Labs and Tests Ordered: Current medicines are reviewed at length with  the patient today.  Concerns regarding medicines are outlined above.  Orders Placed This Encounter  Procedures   EKG 12-Lead   Meds ordered this encounter  Medications   isosorbide mononitrate (IMDUR) 60 MG 24 hr tablet    Sig: Take 1 tablet (60 mg total) by mouth daily.    Dispense:  90 tablet    Refill:  3    Patient Instructions  Medication Instructions:  INCREASE Imdur to 60 mg daily. May take 2 tablets of 30 mg   *If you need a refill on your cardiac medications before your next appointment, please call your pharmacy*  Lab Work: NONE ordered at this time of appointment   If you have labs (blood work) drawn today and your tests are completely normal, you will receive your results only by: [place] (if you have MyChart) OR A paper copy in the mail If you have any lab test that is abnormal or we need to change your treatment, we will call you to review the  results.  Testing/Procedures: NONE ordered at this time of appointment   Follow-Up: At [facility], you and your health needs are our priority.  As part of our continuing mission to provide you with exceptional heart care, we have created designated Provider Care Teams.  These Care Teams include your primary Cardiologist (physician) and Advanced Practice Providers (APPs -  Physician Assistants and Nurse Practitioners) who all work together to provide you with the care you need, when you need it.  We recommend signing up for the patient portal called "MyChart".  Sign up information is provided on this After Visit Summary.  MyChart is used to connect with patients for Virtual Visits (Telemedicine).  Patients are able to view lab/test results, encounter notes, upcoming appointments, etc.  Non-urgent messages can be sent to your provider as well.   To learn more about what you can do with MyChart, go to [email or web address].    Your next appointment:   4 week(s)  The format for your next appointment:   In Person  Provider:   APP on day Dr. Debara Pickett is in the office. Then, Pixie Casino, MD will plan to see you again in 4-6 month(s).    Other Instructions  How to Increase Your Level of Physical Activity Getting regular physical activity is important for your overall health and well-being. Most people do not get enough exercise. There are easy ways to increase your level of physical activity, even if you have not been very active in the past or if you are just starting out. What are the benefits of physical activity? Physical activity has many short-term and long-term benefits. Being active on a regular basis can improve your physical and mental health as well as provide other benefits. Physical health benefits Helping you lose weight or maintain a healthy weight. Strengthening your muscles and bones. Reducing your risk of certain long-term (chronic) diseases, including heart disease,  cancer, and diabetes. Being able to move around more easily and for longer periods of time without getting tired (increased endurance or stamina). Improving your ability to fight off illness (enhanced immunity). Being able to sleep better. Helping you stay healthy as you get older, including: Helping you stay mobile, or capable of walking and moving around. Preventing accidents, such as falls. Increasing life expectancy. Mental health benefits Boosting your mood and improving your self-esteem. Lowering your chance of having mental health problems, such as depression or anxiety. Helping you feel good about your body. Other  benefits Finding new sources of fun and enjoyment. Meeting new people who share a common interest. Before you begin If you have a chronic illness or have not been active for a while, check with your health care provider about how to get started. Ask your health care provider what activities are safe for you. Start out slowly. Walking or doing some simple chair exercises is a good place to start, especially if you have not been active before or for a long time. Set goals that you can work toward. Ask your health care provider how much exercise is best for you. In general, most adults should: Do moderate-intensity exercise for at least 150 minutes each week (30 minutes on most days of the week) or vigorous exercise for at least 75 minutes each week, or a combination of these. Moderate-intensity exercise can include walking at a quick pace, biking, yoga, water aerobics, or gardening. Vigorous exercise involves activities that take more effort, such as jogging or running, playing sports, swimming laps, or jumping rope. Do strength exercises on at least 2 days each week. This can include weight lifting, body weight exercises, and resistance-band exercises. How to be more physically active Make a plan  Try to find activities that you enjoy. You are more likely to commit to an  exercise routine if it does not feel like a chore. If you have bone or joint problems, choose low-impact exercises, like walking or swimming. Use these tips for being successful with an exercise plan: Find a workout partner for accountability. Join a group or class, such as an aerobics class, cycling class, or sports team. Make family time active. Go for a walk, bike, or swim. Include a variety of exercises each week. Consider using a fitness tracker, such as a mobile phone app or a device worn like a watch, that will count the number of steps you take each day. Many people strive to reach 10,000 steps a day. Find ways to be active in your daily routines Besides your formal exercise plans, you can find ways to do physical activity during your daily routines, such as: Walking or biking to work or to the store. Taking the stairs instead of the elevator. Parking farther away from the door at work or at the store. Planning walking meetings. Walking around while you are on the phone. Where to find more information Centers for Disease Control and Prevention: WorkDashboard.es President's Council on Fitness, Sports & Nutrition: www.fitness.gov ChooseMyPlate: MassVoice.es Contact a health care provider if: You have headaches, muscle aches, or joint pain that is concerning. You feel dizzy or light-headed while exercising. You faint. You feel your heart skipping, racing, or fluttering. You have chest pain while exercising. Summary Exercise benefits your mind and body at any age, even if you are just starting out. If you have a chronic illness or have not been active for a while, check with your health care provider before increasing your physical activity. Choose activities that are safe and enjoyable for you. Ask your health care provider what activities are safe for you. Start slowly. Tell your health care provider if you have problems as you start to increase your activity  level. This information is not intended to replace advice given to you by your health care provider. Make sure you discuss any questions you have with your health care provider. Document Revised: 10/29/2020 Document Reviewed: 10/29/2020 Elsevier Patient Education  [place]  Hilbert Corrigan, [place]  01/13/2022 10:48 PM    [facility]

## 2022-01-11 NOTE — Progress Notes (Signed)
[facility] [place]  Patient name: [name] [name] MRN: [id number] DOB: Mar 03, 1951 Sex: male  REASON FOR CONSULT: Evaluation discomfort in left forearm with history of prior left brachiocephalic fistula creation  HPI: [name] [name] is a 71 y.o. male, who is here today for evaluation.  He is here with his wife.  He has a history of prior left brachiocephalic fistula creation by Dr. Oneida Alar on 08/09/2016 he currently has a dialysis via peritoneal dialysis and is not using his fistula.  He has had progressive achy sensation and numbness from his left forearm into his hand and is seen today to determine if this could be related to the fistula.  He does have a history of carpal tunnel syndrome with release in his right hand in the past.  He denies any coolness of his left hand and no tissue loss.  Past Medical History:  Diagnosis Date   Anemia    Arthritis    Asthma    Bell palsy    CAD (coronary artery disease)    a. 2014 MV: abnl w/ infap ischemia; b. 03/2013 Cath: aneurysmal bleb in the LAD w/ otw nonobs dzs-->Med Rx.   Chronic back pain    Chronic knee pain    a. 09/2015 s/p R TKA.   Chronic pain    Chronic shoulder pain    Chronic sinusitis    COPD (chronic obstructive pulmonary disease) (HCC)    Diabetes mellitus without complication ([place])    type II    ESRD on peritoneal dialysis ([place])    on peritoneal dialysis, [facility]   Essential hypertension    GERD (gastroesophageal reflux disease)    Gout    Gout    Hepatomegaly    noted on noncontrast CT 2015   History of hiatal hernia    Hyperlipidemia    Lateral meniscus tear    Obesity    Truncal   Obstructive sleep apnea    does not use cpap    On home oxygen therapy    uses 2l when is going somewhere per patient    PUD (peptic ulcer disease)    remote, reports f/u EGD about 8 years ago unremarkable    Reactive airway disease    related to exposure to chemical  during 9/11   Sinusitis    Vitamin D deficiency     Family History  Problem Relation Age of Onset   Hypertension Mother        MI   Cancer Mother        breast    Diabetes Mother    Diabetes Father    Hypertension Father    Hypertension Sister    Diabetes Sister    Arthritis Other    Asthma Other    Lung disease Other    Anesthesia problems Neg Hx    Hypotension Neg Hx    Malignant hyperthermia Neg Hx    Pseudochol deficiency Neg Hx    Colon cancer Neg Hx     SOCIAL HISTORY: Social History   Socioeconomic History   Marital status: Married    Spouse name: Not on file   Number of children: 2   Years of education: 12th grade   Highest education level: Not on file  Occupational History   Occupation: disabled   Occupation:      Fish farm manager: UNEMPLOYED  Tobacco Use   Smoking status: Former    Packs/day: 1.00    Years: 25.00  Total pack years: 25.00    Types: Cigarettes    Quit date: 03/27/2010    Years since quitting: 11.8   Smokeless tobacco: Never   Tobacco comments:    Quit x 7 years  Vaping Use   Vaping Use: Never used  Substance and Sexual Activity   Alcohol use: Not Currently    Comment: occasionally   Drug use: Yes    Types: Marijuana    Comment: cocaine- last time used- 11/24/2017 , marijuana-    Sexual activity: Yes  Other Topics Concern   Not on file  Social History Narrative   He quit smoking in 2010. 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 He developed pulmonary problems, became disabled because of lower airway disease in 2009.       WATCHES BASKETBALL. HIS TEAM IS [place].   Social Determinants of Health   Financial Resource Strain: Low Risk  (12/29/2021)   Overall Financial Resource Strain (CARDIA)    Difficulty of Paying Living Expenses: Not hard at all  Food Insecurity: No Food Insecurity (12/29/2021)   Hunger Vital Sign    Worried About Running Out of Food in the Last Year: Never true    Ran Out  of Food in the Last Year: Never true  Transportation Needs: No Transportation Needs (12/29/2021)   PRAPARE - Hydrologist (Medical): No    Lack of Transportation (Non-Medical): No  Physical Activity: Sufficiently Active (12/29/2021)   Exercise Vital Sign    Days of Exercise per Week: 3 days    Minutes of Exercise per Session: 60 min  Stress: No Stress Concern Present (12/29/2021)   [place]    Feeling of Stress : Not at all  Social Connections: [place] (12/29/2021)   Social Connection and Isolation Panel [NHANES]    Frequency of Communication with Friends and Family: More than three times a week    Frequency of Social Gatherings with Friends and Family: More than three times a week    Attends Religious Services: More than 4 times per year    Active Member of [employer] or Organizations: Yes    Attends Music therapist: More than 4 times per year    Marital Status: Married  Human resources officer Violence: Not At Risk (12/29/2021)   Humiliation, Afraid, Rape, and Kick questionnaire    Fear of Current or Ex-Partner: No    Emotionally Abused: No    Physically Abused: No    Sexually Abused: No    Allergies  Allergen Reactions   Opana [Oxymorphone Hcl] Itching   Oxymorphone Itching   Tramadol Itching    Current Outpatient Medications  Medication Sig Dispense Refill   albuterol (VENTOLIN HFA) 108 (90 Base) MCG/ACT inhaler Inhale 2 puffs into the lungs every 6 (six) hours as needed for wheezing or shortness of breath. 18 g 11   aspirin EC 81 MG tablet Take 81 mg by mouth daily. Swallow whole.     budesonide-formoterol (SYMBICORT) 160-4.5 MCG/ACT inhaler Inhale 2 puffs into the lungs 2 (two) times daily. 1 each 6   calcitRIOL (ROCALTROL) 0.25 MCG capsule Take 0.25 mcg by mouth daily.     glucose blood test strip 1 each by Other route 2 (two) times daily. Use as instructed to check  blood glucose twice daily 200 each 2   insulin glargine (LANTUS SOLOSTAR) 100 UNIT/ML Solostar Pen Inject 22 Units into  the skin at bedtime. 30 mL 1   Insulin Pen Needle (DROPLET PEN NEEDLES) 31G X 5 MM MISC Use to inject insulin daily as directed 150 each 2   isosorbide mononitrate (IMDUR) 30 MG 24 hr tablet Take 1 tablet (30 mg total) by mouth daily. 90 tablet 3   nitroGLYCERIN (NITROSTAT) 0.4 MG SL tablet Place 1 tablet (0.4 mg total) under the tongue every 5 (five) minutes as needed for chest pain. 30 tablet 1   pantoprazole (PROTONIX) 40 MG tablet Take 1 tablet (40 mg total) by mouth 2 (two) times daily before a meal. 60 tablet 5   sevelamer (RENAGEL) 800 MG tablet Take 2,400 mg by mouth 3 (three) times daily with meals.     simvastatin (ZOCOR) 20 MG tablet Take 20 mg by mouth every morning.     ticagrelor (BRILINTA) 90 MG TABS tablet Take 1 tablet (90 mg total) by mouth 2 (two) times daily. 60 tablet 3   torsemide (DEMADEX) 100 MG tablet Take 100 mg by mouth daily.     TRADJENTA 5 MG TABS tablet TAKE 1 TABLET EVERY DAY 90 tablet 1   Vitamin D, Ergocalciferol, (DRISDOL) 1.25 MG (50000 UNIT) CAPS capsule Take 50,000 Units by mouth every 7 (seven) days. Monday     losartan (COZAAR) 25 MG tablet Take 1 tablet (25 mg total) by mouth daily. 30 tablet 6   No current facility-administered medications for this visit.    REVIEW OF SYSTEMS:  '[X]'$  denotes positive finding, '[ ]'$  denotes negative finding Cardiac  Comments:  Chest pain or chest pressure:    Shortness of breath upon exertion:    Short of breath when lying flat:    Irregular heart rhythm:        Vascular    Pain in calf, thigh, or hip brought on by ambulation:    Pain in feet at night that wakes you up from your sleep:     Blood clot in your veins:    Leg swelling:         Pulmonary    Oxygen at home:    Productive cough:     Wheezing:         Neurologic    Sudden weakness in arms or legs:     Sudden numbness in arms or  legs:     Sudden onset of difficulty speaking or slurred speech:    Temporary loss of vision in one eye:     Problems with dizziness:         Gastrointestinal    Blood in stool:     Vomited blood:         Genitourinary    Burning when urinating:     Blood in urine:        Psychiatric    Major depression:         Hematologic    Bleeding problems:    Problems with blood clotting too easily:        Skin    Rashes or ulcers:        Constitutional    Fever or chills:      PHYSICAL EXAM: Vitals:   01/11/22 0955  BP: 112/66  Pulse: (!) 44  Resp: 16  Temp: (!) 96.6 F (35.9 C)  TempSrc: Temporal  SpO2: 97%  Weight: 224 lb 3.2 oz (101.7 kg)  Height: '5\' 4"'$  (1.626 m)    GENERAL: The patient is a well-nourished male, in no acute distress. The vital  signs are documented above. CARDIOVASCULAR: 2+ left radial pulse with possibly slight augmentation with occlusion of his fistula.  He does have a good thrill in his left upper arm fistula.  This is tortuous but otherwise close to the surface with good size maturation PULMONARY: There is good air exchange  MUSCULOSKELETAL: There are no major deformities or cyanosis. NEUROLOGIC: No focal weakness or paresthesias are detected. SKIN: There are no ulcers or rashes noted. PSYCHIATRIC: The patient has a normal affect.  DATA:  None  MEDICAL ISSUES: I discussed his longstanding fistula with the patient.  Explained it would be very unusual to begin having symptoms this far out from his surgery.  His symptoms do not appear to be classic for steal.  He does have a history of carpal tunnel syndrome in the right hand.  His symptoms do sound more neurogenic with either cervical disc or carpal tunnel syndrome.  I reassured him that he has normal flow into the hand.  Also explained that be very straightforward to ligate his fistula to rule this out as a cause but I think this would have a low chance of giving him any improvement.  Also would "burn  a bridge" if he ever had contraindications for continued peritoneal dialysis.  He was reassured and will continue work-up for other possible etiologies of his discomfort with his primary care provider   Rosetta Posner, MD [facility] [phone] Pager [phone]  Note: Portions of this report may have been transcribed using voice recognition software.  Every effort has been made to ensure accuracy; however, inadvertent computerized transcription errors may still be present.

## 2022-01-11 NOTE — Patient Instructions (Addendum)
Medication Instructions:  INCREASE Imdur to 60 mg daily. May take 2 tablets of 30 mg   *If you need a refill on your cardiac medications before your next appointment, please call your pharmacy*  Lab Work: NONE ordered at this time of appointment   If you have labs (blood work) drawn today and your tests are completely normal, you will receive your results only by: [place] (if you have MyChart) OR A paper copy in the mail If you have any lab test that is abnormal or we need to change your treatment, we will call you to review the results.  Testing/Procedures: NONE ordered at this time of appointment   Follow-Up: At [facility], you and your health needs are our priority.  As part of our continuing mission to provide you with exceptional heart care, we have created designated Provider Care Teams.  These Care Teams include your primary Cardiologist (physician) and Advanced Practice Providers (APPs -  Physician Assistants and Nurse Practitioners) who all work together to provide you with the care you need, when you need it.  We recommend signing up for the patient portal called "MyChart".  Sign up information is provided on this After Visit Summary.  MyChart is used to connect with patients for Virtual Visits (Telemedicine).  Patients are able to view lab/test results, encounter notes, upcoming appointments, etc.  Non-urgent messages can be sent to your provider as well.   To learn more about what you can do with MyChart, go to [email or web address].    Your next appointment:   4 week(s)  The format for your next appointment:   In Person  Provider:   APP on day Dr. Debara Pickett is in the office. Then, Pixie Casino, MD will plan to see you again in 4-6 month(s).    Other Instructions  How to Increase Your Level of Physical Activity Getting regular physical activity is important for your overall health and well-being. Most people do not get enough exercise. There are easy ways  to increase your level of physical activity, even if you have not been very active in the past or if you are just starting out. What are the benefits of physical activity? Physical activity has many short-term and long-term benefits. Being active on a regular basis can improve your physical and mental health as well as provide other benefits. Physical health benefits Helping you lose weight or maintain a healthy weight. Strengthening your muscles and bones. Reducing your risk of certain long-term (chronic) diseases, including heart disease, cancer, and diabetes. Being able to move around more easily and for longer periods of time without getting tired (increased endurance or stamina). Improving your ability to fight off illness (enhanced immunity). Being able to sleep better. Helping you stay healthy as you get older, including: Helping you stay mobile, or capable of walking and moving around. Preventing accidents, such as falls. Increasing life expectancy. Mental health benefits Boosting your mood and improving your self-esteem. Lowering your chance of having mental health problems, such as depression or anxiety. Helping you feel good about your body. Other benefits Finding new sources of fun and enjoyment. Meeting new people who share a common interest. Before you begin If you have a chronic illness or have not been active for a while, check with your health care provider about how to get started. Ask your health care provider what activities are safe for you. Start out slowly. Walking or doing some simple chair exercises is a good place to start, especially  if you have not been active before or for a long time. Set goals that you can work toward. Ask your health care provider how much exercise is best for you. In general, most adults should: Do moderate-intensity exercise for at least 150 minutes each week (30 minutes on most days of the week) or vigorous exercise for at least 75 minutes  each week, or a combination of these. Moderate-intensity exercise can include walking at a quick pace, biking, yoga, water aerobics, or gardening. Vigorous exercise involves activities that take more effort, such as jogging or running, playing sports, swimming laps, or jumping rope. Do strength exercises on at least 2 days each week. This can include weight lifting, body weight exercises, and resistance-band exercises. How to be more physically active Make a plan  Try to find activities that you enjoy. You are more likely to commit to an exercise routine if it does not feel like a chore. If you have bone or joint problems, choose low-impact exercises, like walking or swimming. Use these tips for being successful with an exercise plan: Find a workout partner for accountability. Join a group or class, such as an aerobics class, cycling class, or sports team. Make family time active. Go for a walk, bike, or swim. Include a variety of exercises each week. Consider using a fitness tracker, such as a mobile phone app or a device worn like a watch, that will count the number of steps you take each day. Many people strive to reach 10,000 steps a day. Find ways to be active in your daily routines Besides your formal exercise plans, you can find ways to do physical activity during your daily routines, such as: Walking or biking to work or to the store. Taking the stairs instead of the elevator. Parking farther away from the door at work or at the store. Planning walking meetings. Walking around while you are on the phone. Where to find more information Centers for Disease Control and Prevention: WorkDashboard.es President's Council on Fitness, Sports & Nutrition: www.fitness.gov ChooseMyPlate: MassVoice.es Contact a health care provider if: You have headaches, muscle aches, or joint pain that is concerning. You feel dizzy or light-headed while exercising. You faint. You feel your  heart skipping, racing, or fluttering. You have chest pain while exercising. Summary Exercise benefits your mind and body at any age, even if you are just starting out. If you have a chronic illness or have not been active for a while, check with your health care provider before increasing your physical activity. Choose activities that are safe and enjoyable for you. Ask your health care provider what activities are safe for you. Start slowly. Tell your health care provider if you have problems as you start to increase your activity level. This information is not intended to replace advice given to you by your health care provider. Make sure you discuss any questions you have with your health care provider. Document Revised: 10/29/2020 Document Reviewed: 10/29/2020 Elsevier Patient Education  [place]

## 2022-01-12 ENCOUNTER — Ambulatory Visit: Payer: Self-pay | Admitting: *Deleted

## 2022-01-12 DIAGNOSIS — D631 Anemia in chronic kidney disease: Secondary | ICD-10-CM | POA: Diagnosis not present

## 2022-01-12 DIAGNOSIS — N186 End stage renal disease: Secondary | ICD-10-CM | POA: Diagnosis not present

## 2022-01-12 DIAGNOSIS — Z992 Dependence on renal dialysis: Secondary | ICD-10-CM | POA: Diagnosis not present

## 2022-01-12 DIAGNOSIS — N250 Renal osteodystrophy: Secondary | ICD-10-CM | POA: Diagnosis not present

## 2022-01-12 DIAGNOSIS — D509 Iron deficiency anemia, unspecified: Secondary | ICD-10-CM | POA: Diagnosis not present

## 2022-01-13 ENCOUNTER — Encounter: Payer: Self-pay | Admitting: Physician Assistant

## 2022-01-13 DIAGNOSIS — D631 Anemia in chronic kidney disease: Secondary | ICD-10-CM | POA: Diagnosis not present

## 2022-01-13 DIAGNOSIS — D509 Iron deficiency anemia, unspecified: Secondary | ICD-10-CM | POA: Diagnosis not present

## 2022-01-13 DIAGNOSIS — Z992 Dependence on renal dialysis: Secondary | ICD-10-CM | POA: Diagnosis not present

## 2022-01-13 DIAGNOSIS — N250 Renal osteodystrophy: Secondary | ICD-10-CM | POA: Diagnosis not present

## 2022-01-13 DIAGNOSIS — N186 End stage renal disease: Secondary | ICD-10-CM | POA: Diagnosis not present

## 2022-01-14 DIAGNOSIS — Z992 Dependence on renal dialysis: Secondary | ICD-10-CM | POA: Diagnosis not present

## 2022-01-14 DIAGNOSIS — N250 Renal osteodystrophy: Secondary | ICD-10-CM | POA: Diagnosis not present

## 2022-01-14 DIAGNOSIS — D509 Iron deficiency anemia, unspecified: Secondary | ICD-10-CM | POA: Diagnosis not present

## 2022-01-14 DIAGNOSIS — D631 Anemia in chronic kidney disease: Secondary | ICD-10-CM | POA: Diagnosis not present

## 2022-01-14 DIAGNOSIS — N186 End stage renal disease: Secondary | ICD-10-CM | POA: Diagnosis not present

## 2022-01-15 DIAGNOSIS — N250 Renal osteodystrophy: Secondary | ICD-10-CM | POA: Diagnosis not present

## 2022-01-15 DIAGNOSIS — Z992 Dependence on renal dialysis: Secondary | ICD-10-CM | POA: Diagnosis not present

## 2022-01-15 DIAGNOSIS — D509 Iron deficiency anemia, unspecified: Secondary | ICD-10-CM | POA: Diagnosis not present

## 2022-01-15 DIAGNOSIS — N186 End stage renal disease: Secondary | ICD-10-CM | POA: Diagnosis not present

## 2022-01-15 DIAGNOSIS — D631 Anemia in chronic kidney disease: Secondary | ICD-10-CM | POA: Diagnosis not present

## 2022-01-16 DIAGNOSIS — D509 Iron deficiency anemia, unspecified: Secondary | ICD-10-CM | POA: Diagnosis not present

## 2022-01-16 DIAGNOSIS — Z992 Dependence on renal dialysis: Secondary | ICD-10-CM | POA: Diagnosis not present

## 2022-01-16 DIAGNOSIS — N250 Renal osteodystrophy: Secondary | ICD-10-CM | POA: Diagnosis not present

## 2022-01-16 DIAGNOSIS — N186 End stage renal disease: Secondary | ICD-10-CM | POA: Diagnosis not present

## 2022-01-16 DIAGNOSIS — D631 Anemia in chronic kidney disease: Secondary | ICD-10-CM | POA: Diagnosis not present

## 2022-01-17 DIAGNOSIS — Z992 Dependence on renal dialysis: Secondary | ICD-10-CM | POA: Diagnosis not present

## 2022-01-17 DIAGNOSIS — N250 Renal osteodystrophy: Secondary | ICD-10-CM | POA: Diagnosis not present

## 2022-01-17 DIAGNOSIS — N186 End stage renal disease: Secondary | ICD-10-CM | POA: Diagnosis not present

## 2022-01-17 DIAGNOSIS — D509 Iron deficiency anemia, unspecified: Secondary | ICD-10-CM | POA: Diagnosis not present

## 2022-01-17 DIAGNOSIS — D631 Anemia in chronic kidney disease: Secondary | ICD-10-CM | POA: Diagnosis not present

## 2022-01-18 ENCOUNTER — Emergency Department (HOSPITAL_COMMUNITY): Payer: Medicare Other

## 2022-01-18 ENCOUNTER — Inpatient Hospital Stay (HOSPITAL_COMMUNITY)
Admission: EM | Admit: 2022-01-18 | Discharge: 2022-01-21 | DRG: 246 | Disposition: A | Discharge status: Home / Self Care | Payer: Medicare Other | Attending: Internal Medicine | Admitting: Internal Medicine

## 2022-01-18 ENCOUNTER — Encounter (HOSPITAL_COMMUNITY): Payer: Self-pay

## 2022-01-18 ENCOUNTER — Other Ambulatory Visit: Payer: Self-pay

## 2022-01-18 DIAGNOSIS — I10 Essential (primary) hypertension: Secondary | ICD-10-CM | POA: Diagnosis present

## 2022-01-18 DIAGNOSIS — Z87891 Personal history of nicotine dependence: Secondary | ICD-10-CM

## 2022-01-18 DIAGNOSIS — D631 Anemia in chronic kidney disease: Secondary | ICD-10-CM | POA: Diagnosis present

## 2022-01-18 DIAGNOSIS — I208 Other forms of angina pectoris: Secondary | ICD-10-CM | POA: Diagnosis not present

## 2022-01-18 DIAGNOSIS — R778 Other specified abnormalities of plasma proteins: Secondary | ICD-10-CM

## 2022-01-18 DIAGNOSIS — E1122 Type 2 diabetes mellitus with diabetic chronic kidney disease: Secondary | ICD-10-CM | POA: Diagnosis not present

## 2022-01-18 DIAGNOSIS — Z96651 Presence of right artificial knee joint: Secondary | ICD-10-CM | POA: Diagnosis present

## 2022-01-18 DIAGNOSIS — I252 Old myocardial infarction: Secondary | ICD-10-CM | POA: Diagnosis not present

## 2022-01-18 DIAGNOSIS — I120 Hypertensive chronic kidney disease with stage 5 chronic kidney disease or end stage renal disease: Secondary | ICD-10-CM | POA: Diagnosis not present

## 2022-01-18 DIAGNOSIS — I200 Unstable angina: Secondary | ICD-10-CM | POA: Diagnosis present

## 2022-01-18 DIAGNOSIS — M898X9 Other specified disorders of bone, unspecified site: Secondary | ICD-10-CM | POA: Diagnosis present

## 2022-01-18 DIAGNOSIS — Y831 Surgical operation with implant of artificial internal device as the cause of abnormal reaction of the patient, or of later complication, without mention of misadventure at the time of the procedure: Secondary | ICD-10-CM | POA: Diagnosis present

## 2022-01-18 DIAGNOSIS — N186 End stage renal disease: Secondary | ICD-10-CM | POA: Diagnosis present

## 2022-01-18 DIAGNOSIS — Z7902 Long term (current) use of antithrombotics/antiplatelets: Secondary | ICD-10-CM

## 2022-01-18 DIAGNOSIS — N250 Renal osteodystrophy: Secondary | ICD-10-CM | POA: Diagnosis not present

## 2022-01-18 DIAGNOSIS — R001 Bradycardia, unspecified: Secondary | ICD-10-CM | POA: Diagnosis not present

## 2022-01-18 DIAGNOSIS — R0602 Shortness of breath: Secondary | ICD-10-CM | POA: Diagnosis not present

## 2022-01-18 DIAGNOSIS — D649 Anemia, unspecified: Secondary | ICD-10-CM | POA: Diagnosis not present

## 2022-01-18 DIAGNOSIS — Z7951 Long term (current) use of inhaled steroids: Secondary | ICD-10-CM

## 2022-01-18 DIAGNOSIS — K219 Gastro-esophageal reflux disease without esophagitis: Secondary | ICD-10-CM | POA: Diagnosis present

## 2022-01-18 DIAGNOSIS — Z885 Allergy status to narcotic agent status: Secondary | ICD-10-CM | POA: Diagnosis not present

## 2022-01-18 DIAGNOSIS — K760 Fatty (change of) liver, not elsewhere classified: Secondary | ICD-10-CM | POA: Diagnosis not present

## 2022-01-18 DIAGNOSIS — I25110 Atherosclerotic heart disease of native coronary artery with unstable angina pectoris: Secondary | ICD-10-CM | POA: Diagnosis present

## 2022-01-18 DIAGNOSIS — Z9989 Dependence on other enabling machines and devices: Secondary | ICD-10-CM | POA: Diagnosis not present

## 2022-01-18 DIAGNOSIS — J449 Chronic obstructive pulmonary disease, unspecified: Secondary | ICD-10-CM | POA: Diagnosis not present

## 2022-01-18 DIAGNOSIS — G4733 Obstructive sleep apnea (adult) (pediatric): Secondary | ICD-10-CM

## 2022-01-18 DIAGNOSIS — M109 Gout, unspecified: Secondary | ICD-10-CM | POA: Diagnosis present

## 2022-01-18 DIAGNOSIS — Z992 Dependence on renal dialysis: Secondary | ICD-10-CM

## 2022-01-18 DIAGNOSIS — Z833 Family history of diabetes mellitus: Secondary | ICD-10-CM

## 2022-01-18 DIAGNOSIS — I25119 Atherosclerotic heart disease of native coronary artery with unspecified angina pectoris: Secondary | ICD-10-CM | POA: Diagnosis not present

## 2022-01-18 DIAGNOSIS — E785 Hyperlipidemia, unspecified: Secondary | ICD-10-CM | POA: Diagnosis present

## 2022-01-18 DIAGNOSIS — Z8249 Family history of ischemic heart disease and other diseases of the circulatory system: Secondary | ICD-10-CM

## 2022-01-18 DIAGNOSIS — E559 Vitamin D deficiency, unspecified: Secondary | ICD-10-CM | POA: Diagnosis present

## 2022-01-18 DIAGNOSIS — I2510 Atherosclerotic heart disease of native coronary artery without angina pectoris: Secondary | ICD-10-CM | POA: Diagnosis not present

## 2022-01-18 DIAGNOSIS — N185 Chronic kidney disease, stage 5: Secondary | ICD-10-CM

## 2022-01-18 DIAGNOSIS — T82855A Stenosis of coronary artery stent, initial encounter: Secondary | ICD-10-CM | POA: Diagnosis not present

## 2022-01-18 DIAGNOSIS — K279 Peptic ulcer, site unspecified, unspecified as acute or chronic, without hemorrhage or perforation: Secondary | ICD-10-CM | POA: Diagnosis not present

## 2022-01-18 DIAGNOSIS — R079 Chest pain, unspecified: Secondary | ICD-10-CM | POA: Diagnosis present

## 2022-01-18 DIAGNOSIS — Z955 Presence of coronary angioplasty implant and graft: Secondary | ICD-10-CM | POA: Diagnosis not present

## 2022-01-18 DIAGNOSIS — I25118 Atherosclerotic heart disease of native coronary artery with other forms of angina pectoris: Secondary | ICD-10-CM | POA: Diagnosis not present

## 2022-01-18 DIAGNOSIS — Z794 Long term (current) use of insulin: Secondary | ICD-10-CM

## 2022-01-18 DIAGNOSIS — Z6838 Body mass index (BMI) 38.0-38.9, adult: Secondary | ICD-10-CM

## 2022-01-18 DIAGNOSIS — G8929 Other chronic pain: Secondary | ICD-10-CM | POA: Diagnosis present

## 2022-01-18 DIAGNOSIS — I214 Non-ST elevation (NSTEMI) myocardial infarction: Secondary | ICD-10-CM | POA: Diagnosis present

## 2022-01-18 DIAGNOSIS — I209 Angina pectoris, unspecified: Secondary | ICD-10-CM | POA: Diagnosis not present

## 2022-01-18 DIAGNOSIS — I441 Atrioventricular block, second degree: Secondary | ICD-10-CM | POA: Diagnosis not present

## 2022-01-18 DIAGNOSIS — Z79899 Other long term (current) drug therapy: Secondary | ICD-10-CM

## 2022-01-18 DIAGNOSIS — R0789 Other chest pain: Secondary | ICD-10-CM | POA: Diagnosis not present

## 2022-01-18 DIAGNOSIS — Z825 Family history of asthma and other chronic lower respiratory diseases: Secondary | ICD-10-CM

## 2022-01-18 DIAGNOSIS — D509 Iron deficiency anemia, unspecified: Secondary | ICD-10-CM | POA: Diagnosis not present

## 2022-01-18 DIAGNOSIS — Z7982 Long term (current) use of aspirin: Secondary | ICD-10-CM

## 2022-01-18 LAB — CBC
HCT: 30.7 % — ABNORMAL LOW (ref 39.0–52.0)
Hemoglobin: 9.8 g/dL — ABNORMAL LOW (ref 13.0–17.0)
MCH: 30.5 pg (ref 26.0–34.0)
MCHC: 31.9 g/dL (ref 30.0–36.0)
MCV: 95.6 fL (ref 80.0–100.0)
Platelets: 159 10*3/uL (ref 150–400)
RBC: 3.21 MIL/uL — ABNORMAL LOW (ref 4.22–5.81)
RDW: 14.6 % (ref 11.5–15.5)
WBC: 5.7 10*3/uL (ref 4.0–10.5)
nRBC: 0 % (ref 0.0–0.2)

## 2022-01-18 LAB — CBC WITH DIFFERENTIAL/PLATELET
Abs Immature Granulocytes: 0.03 10*3/uL (ref 0.00–0.07)
Basophils Absolute: 0.1 10*3/uL (ref 0.0–0.1)
Basophils Relative: 1 %
Eosinophils Absolute: 0.2 10*3/uL (ref 0.0–0.5)
Eosinophils Relative: 4 %
HCT: 32.1 % — ABNORMAL LOW (ref 39.0–52.0)
Hemoglobin: 10.3 g/dL — ABNORMAL LOW (ref 13.0–17.0)
Immature Granulocytes: 1 %
Lymphocytes Relative: 11 %
Lymphs Abs: 0.6 10*3/uL — ABNORMAL LOW (ref 0.7–4.0)
MCH: 30.4 pg (ref 26.0–34.0)
MCHC: 32.1 g/dL (ref 30.0–36.0)
MCV: 94.7 fL (ref 80.0–100.0)
Monocytes Absolute: 0.6 10*3/uL (ref 0.1–1.0)
Monocytes Relative: 10 %
Neutro Abs: 4.1 10*3/uL (ref 1.7–7.7)
Neutrophils Relative %: 73 %
Platelets: 155 10*3/uL (ref 150–400)
RBC: 3.39 MIL/uL — ABNORMAL LOW (ref 4.22–5.81)
RDW: 14.7 % (ref 11.5–15.5)
WBC: 5.5 10*3/uL (ref 4.0–10.5)
nRBC: 0 % (ref 0.0–0.2)

## 2022-01-18 LAB — COMPREHENSIVE METABOLIC PANEL
ALT: 13 U/L (ref 0–44)
AST: 12 U/L — ABNORMAL LOW (ref 15–41)
Albumin: 3.2 g/dL — ABNORMAL LOW (ref 3.5–5.0)
Alkaline Phosphatase: 75 U/L (ref 38–126)
Anion gap: 12 (ref 5–15)
BUN: 40 mg/dL — ABNORMAL HIGH (ref 8–23)
CO2: 29 mmol/L (ref 22–32)
Calcium: 9.8 mg/dL (ref 8.9–10.3)
Chloride: 94 mmol/L — ABNORMAL LOW (ref 98–111)
Creatinine, Ser: 17.26 mg/dL — ABNORMAL HIGH (ref 0.61–1.24)
GFR, Estimated: 3 mL/min — ABNORMAL LOW (ref >60)
Glucose, Bld: 116 mg/dL — ABNORMAL HIGH (ref 70–99)
Potassium: 3.9 mmol/L (ref 3.5–5.1)
Sodium: 135 mmol/L (ref 135–145)
Total Bilirubin: 0.4 mg/dL (ref 0.3–1.2)
Total Protein: 7.2 g/dL (ref 6.5–8.1)

## 2022-01-18 LAB — CREATININE, SERUM
Creatinine, Ser: 17.08 mg/dL — ABNORMAL HIGH (ref 0.61–1.24)
GFR, Estimated: 3 mL/min — ABNORMAL LOW (ref >60)

## 2022-01-18 LAB — TROPONIN I (HIGH SENSITIVITY)
Troponin I (High Sensitivity): 448 ng/L (ref <18)
Troponin I (High Sensitivity): 506 ng/L (ref <18)
Troponin I (High Sensitivity): 541 ng/L (ref <18)
Troponin I (High Sensitivity): 533 ng/L (ref <18)

## 2022-01-18 LAB — GLUCOSE, CAPILLARY
Glucose-Capillary: 118 mg/dL — ABNORMAL HIGH (ref 70–99)
Glucose-Capillary: 130 mg/dL — ABNORMAL HIGH (ref 70–99)

## 2022-01-18 MED ORDER — NITROGLYCERIN 0.4 MG SL SUBL
0.4000 mg | SUBLINGUAL_TABLET | SUBLINGUAL | Status: DC | PRN
Start: 2022-01-18 — End: 2022-01-21
  Administered 2022-01-18: 0.4 mg via SUBLINGUAL
  Filled 2022-01-18: qty 1

## 2022-01-18 MED ORDER — SEVELAMER CARBONATE 800 MG PO TABS
2400.0000 mg | ORAL_TABLET | Freq: Three times a day (TID) | ORAL | Status: DC
  Administered 2022-01-18 – 2022-01-21 (×5): 2400 mg via ORAL
  Filled 2022-01-18 (×5): qty 3

## 2022-01-18 MED ORDER — INSULIN ASPART 100 UNIT/ML IJ SOLN
3.0000 [IU] | Freq: Three times a day (TID) | INTRAMUSCULAR | Status: DC
  Administered 2022-01-21: 3 [IU] via SUBCUTANEOUS

## 2022-01-18 MED ORDER — INSULIN ASPART 100 UNIT/ML IJ SOLN
0.0000 [IU] | Freq: Three times a day (TID) | INTRAMUSCULAR | Status: DC
  Administered 2022-01-20 – 2022-01-21 (×2): 1 [IU] via SUBCUTANEOUS

## 2022-01-18 MED ORDER — TICAGRELOR 90 MG PO TABS
90.0000 mg | ORAL_TABLET | Freq: Two times a day (BID) | ORAL | Status: DC
  Administered 2022-01-18 – 2022-01-21 (×6): 90 mg via ORAL
  Filled 2022-01-18 (×6): qty 1

## 2022-01-18 MED ORDER — PANTOPRAZOLE SODIUM 40 MG PO TBEC
40.0000 mg | DELAYED_RELEASE_TABLET | Freq: Two times a day (BID) | ORAL | Status: DC
  Administered 2022-01-19 – 2022-01-21 (×5): 40 mg via ORAL
  Filled 2022-01-18 (×6): qty 1

## 2022-01-18 MED ORDER — LOSARTAN POTASSIUM 25 MG PO TABS
25.0000 mg | ORAL_TABLET | Freq: Every day | ORAL | Status: DC
  Administered 2022-01-19 – 2022-01-21 (×3): 25 mg via ORAL
  Filled 2022-01-18 (×3): qty 1

## 2022-01-18 MED ORDER — LINAGLIPTIN 5 MG PO TABS
5.0000 mg | ORAL_TABLET | Freq: Every day | ORAL | Status: DC
  Administered 2022-01-19 – 2022-01-21 (×3): 5 mg via ORAL
  Filled 2022-01-18 (×3): qty 1

## 2022-01-18 MED ORDER — INSULIN GLARGINE-YFGN 100 UNIT/ML ~~LOC~~ SOLN
20.0000 [IU] | Freq: Every day | SUBCUTANEOUS | Status: DC
  Administered 2022-01-18 – 2022-01-21 (×3): 20 [IU] via SUBCUTANEOUS
  Filled 2022-01-18 (×6): qty 0.2

## 2022-01-18 MED ORDER — LINAGLIPTIN 5 MG PO TABS
5.0000 mg | ORAL_TABLET | Freq: Every day | ORAL | Status: DC
Start: 2022-01-18 — End: 2022-01-18

## 2022-01-18 MED ORDER — ISOSORBIDE MONONITRATE ER 60 MG PO TB24
60.0000 mg | ORAL_TABLET | Freq: Every day | ORAL | Status: DC
  Administered 2022-01-19: 60 mg via ORAL
  Filled 2022-01-18: qty 1

## 2022-01-18 MED ORDER — HEPARIN SODIUM (PORCINE) 5000 UNIT/ML IJ SOLN
5000.0000 [IU] | Freq: Three times a day (TID) | INTRAMUSCULAR | Status: DC
  Administered 2022-01-18: 5000 [IU] via SUBCUTANEOUS
  Filled 2022-01-18: qty 1

## 2022-01-18 MED ORDER — ONDANSETRON HCL 4 MG/2ML IJ SOLN
4.0000 mg | Freq: Four times a day (QID) | INTRAMUSCULAR | Status: DC | PRN
  Administered 2022-01-20: 4 mg via INTRAVENOUS
  Filled 2022-01-18: qty 2

## 2022-01-18 MED ORDER — LOSARTAN POTASSIUM 25 MG PO TABS: 25.0000 mg | ORAL_TABLET | Freq: Every day | ORAL | Status: DC

## 2022-01-18 MED ORDER — SIMVASTATIN 20 MG PO TABS
20.0000 mg | ORAL_TABLET | Freq: Every morning | ORAL | Status: DC
  Administered 2022-01-18 – 2022-01-21 (×4): 20 mg via ORAL
  Filled 2022-01-18: qty 2
  Filled 2022-01-18 (×2): qty 1

## 2022-01-18 MED ORDER — HEPARIN (PORCINE) 25000 UT/250ML-% IV SOLN
1100.0000 [IU]/h | INTRAVENOUS | Status: DC
  Administered 2022-01-18: 900 [IU]/h via INTRAVENOUS
  Filled 2022-01-18 (×2): qty 250

## 2022-01-18 MED ORDER — CALCITRIOL 0.25 MCG PO CAPS: 0.2500 ug | ORAL_CAPSULE | Freq: Every day | ORAL | Status: DC

## 2022-01-18 MED ORDER — ASPIRIN 81 MG PO TBEC
81.0000 mg | DELAYED_RELEASE_TABLET | Freq: Every day | ORAL | Status: DC
  Administered 2022-01-18 – 2022-01-21 (×4): 81 mg via ORAL
  Filled 2022-01-18 (×4): qty 1

## 2022-01-18 MED ORDER — ALBUTEROL SULFATE HFA 108 (90 BASE) MCG/ACT IN AERS: 2.0000 | INHALATION_SPRAY | Freq: Four times a day (QID) | RESPIRATORY_TRACT | Status: DC | PRN

## 2022-01-18 MED ORDER — HEPARIN BOLUS VIA INFUSION
2000.0000 [IU] | Freq: Once | INTRAVENOUS | Status: AC
  Administered 2022-01-18: 2000 [IU] via INTRAVENOUS

## 2022-01-18 MED ORDER — ACETAMINOPHEN 325 MG PO TABS
650.0000 mg | ORAL_TABLET | ORAL | Status: DC | PRN
  Administered 2022-01-18 – 2022-01-21 (×3): 650 mg via ORAL
  Filled 2022-01-18 (×4): qty 2

## 2022-01-18 MED ORDER — CALCITRIOL 0.25 MCG PO CAPS
0.2500 ug | ORAL_CAPSULE | Freq: Every day | ORAL | Status: DC
  Administered 2022-01-19 – 2022-01-21 (×3): 0.25 ug via ORAL
  Filled 2022-01-18 (×3): qty 1

## 2022-01-18 MED ORDER — ISOSORBIDE MONONITRATE ER 60 MG PO TB24: 60.0000 mg | ORAL_TABLET | Freq: Every day | ORAL | Status: DC

## 2022-01-18 MED ORDER — TORSEMIDE 20 MG PO TABS
100.0000 mg | ORAL_TABLET | Freq: Every day | ORAL | Status: DC
  Administered 2022-01-18 – 2022-01-21 (×4): 100 mg via ORAL
  Filled 2022-01-18: qty 5
  Filled 2022-01-18: qty 1
  Filled 2022-01-18: qty 5
  Filled 2022-01-18: qty 1

## 2022-01-18 NOTE — H&P (Signed)
History and Physical  [facility]  [name] [name] FGH:[id number] DOB: 28-Oct-1950 DOA: 01/18/2022  PCP: Carrolyn Meiers, MD  Patient coming from: sent from PCP office  Level of care: Telemetry Cardiac  I have personally briefly reviewed patient's old medical records in [place]  Chief Complaint: chest pain   HPI: [name] [name] is a 71 year old gentleman with end-stage renal disease on nightly peritoneal dialysis, type 2 diabetes mellitus, history of polysubstance abuse including cocaine and marijuana, OSA, extensive coronary artery disease status post recent drug-eluting stent and angioplasty to the RCA had a repeat cardiac catheterization 12/26/2021 which demonstrated severe single-vessel CAD with sequential 90-95% mid RCA stenosis at the site of angioplasty and previously placed RCA stent demonstrated mild to moderate in-stent restenosis in the distal third and patient subsequently had successful DES x2 to mid RCA.  He was also recently found to have an AV block.  His beta-blockers have been discontinued.  During his recent outpatient visit with cardiology his Imdur was increased from 30 mg to 60 mg.  Ports that he has continued to have chest pain since recent stents placed.  He describes the pain and burning sensation in the chest is worse with exertion.  He reports that he walks daily and has been experiencing more chest pain since that time.  He saw his PCP today and there was some concern about bradycardia and also concerning about ongoing chest pain symptoms and he was sent to the emergency department for further evaluation.  Cardiology was contacted by the emergency physician who recommended patient be observed in the hospital for further management due to rising high-sensitivity troponin.  Fortunately his bradycardia has resolved at this time however first-degree AV block noted on EKG and telemetry monitoring.  Patient also reports he had a recently completed outpatient  heart monitor study.  Review of Systems: Review of Systems  Constitutional:  Positive for malaise/fatigue.  HENT: Negative.    Eyes: Negative.   Respiratory:  Positive for cough. Negative for hemoptysis, sputum production, shortness of breath and wheezing.   Cardiovascular:  Positive for chest pain. Negative for leg swelling and PND.  Gastrointestinal:  Positive for heartburn. Negative for abdominal pain, constipation, nausea and vomiting.  Genitourinary: Negative.   Musculoskeletal: Negative.   Skin: Negative.   Neurological: Negative.   Endo/Heme/Allergies: Negative.   Psychiatric/Behavioral: Negative.    All other systems reviewed and are negative.    Past Medical History:  Diagnosis Date   Anemia    Arthritis    Asthma    Bell palsy    CAD (coronary artery disease)    a. 2014 MV: abnl w/ infap ischemia; b. 03/2013 Cath: aneurysmal bleb in the LAD w/ otw nonobs dzs-->Med Rx.   Chronic back pain    Chronic knee pain    a. 09/2015 s/p R TKA.   Chronic pain    Chronic shoulder pain    Chronic sinusitis    COPD (chronic obstructive pulmonary disease) ([place])    Diabetes mellitus without complication ([place])    type II    ESRD on peritoneal dialysis ([place])    on peritoneal dialysis, [facility]   Essential hypertension    GERD (gastroesophageal reflux disease)    Gout    Gout    Hepatomegaly    noted on noncontrast CT 2015   History of hiatal hernia    Hyperlipidemia    Lateral meniscus tear    Obesity    Truncal  Obstructive sleep apnea    does not use cpap    On home oxygen therapy    uses 2l when is going somewhere per patient    PUD (peptic ulcer disease)    remote, reports f/u EGD about 8 years ago unremarkable    Reactive airway disease    related to exposure to chemical during 9/11   Sinusitis    Vitamin D deficiency     Past Surgical History:  Procedure Laterality Date   ASAD LT SHOULDER  12/15/2008   left shoulder   AV FISTULA PLACEMENT Left  08/09/2016   Procedure: BRACHIOCEPHALIC ARTERIOVENOUS (AV) FISTULA CREATION LEFT ARM;  Surgeon: [name], MD;  Location: [facility];  Service: Vascular;  Laterality: Left;   CAPD INSERTION N/A 10/07/2018   Procedure: LAPAROSCOPIC PERITONEAL CATHETER PLACEMENT;  Surgeon: Mickeal Skinner, MD;  [facility];  Service: General;  Laterality: N/A;   CATARACT EXTRACTION W/PHACO Left 03/28/2016   Procedure: CATARACT EXTRACTION PHACO AND INTRAOCULAR LENS PLACEMENT LEFT EYE;  Surgeon: [name], MD;  Location: [facility];  Service: Ophthalmology;  Laterality: Left;  CDE: 4.77   CATARACT EXTRACTION W/PHACO Right 04/11/2016   Procedure: CATARACT EXTRACTION PHACO AND INTRAOCULAR LENS PLACEMENT RIGHT EYE; CDE:  4.74;  Surgeon: [name], MD;  [facility];  Service: Ophthalmology;  Laterality: Right;   COLONOSCOPY  10/15/2008   Fields: Rectal polyp obliterated, not retrieved, hemorrhoids, single ascending colon diverticulum near the CV. Next colonoscopy April 2020   COLONOSCOPY N/A 12/25/2014   SLF: 1. Colorectal polyps (2) removed 2. Small internal hemorrhoids 3. the left colon is severely redundant. hyperplastic polyps   CORONARY STENT INTERVENTION N/A 07/25/2021   Procedure: CORONARY STENT INTERVENTION;  Surgeon: Sherren Mocha, MD;  Location: [facility];  Service: Cardiovascular;  Laterality: N/A;   CORONARY STENT INTERVENTION N/A 12/26/2021   Procedure: CORONARY STENT INTERVENTION;  Surgeon: Nelva Bush, MD;  Location: [facility];  Service: Cardiovascular;  Laterality: N/A;   DOPPLER ECHOCARDIOGRAPHY     ESOPHAGOGASTRODUODENOSCOPY N/A 12/25/2014   SLF: 1. Anemia most likely due to CRI, gastritis, gastric polyps 2. Moderate non-erosive gastriits and mild duodenitis.  3.TWo large gstric polyps removed.    EYE SURGERY  12/22/2010   tear duct probing-[place]   FOREIGN BODY REMOVAL  03/29/2011   Procedure: REMOVAL FOREIGN BODY EXTREMITY;  Surgeon: Arther Abbott, MD;   [facility];  Service: Orthopedics;  Laterality: Right;  Removal Foreign Body Right Thumb   IR FLUORO GUIDE CV LINE RIGHT  08/06/2018   IR US GUIDE VASC ACCESS RIGHT  08/06/2018   KNEE ARTHROSCOPY  10/16/2007   left   KNEE ARTHROSCOPY WITH LATERAL MENISECTOMY Right 10/14/2015   Procedure: LEFT KNEE ARTHROSCOPY WITH PARTIAL LATERAL MENISECTOMY;  Surgeon: Carole Civil, MD;  [facility];  Service: Orthopedics;  Laterality: Right;   LEFT HEART CATH AND CORONARY ANGIOGRAPHY N/A 07/25/2021   Procedure: LEFT HEART CATH AND CORONARY ANGIOGRAPHY;  Surgeon: Sherren Mocha, MD;  Location: [facility];  Service: Cardiovascular;  Laterality: N/A;   LEFT HEART CATH AND CORONARY ANGIOGRAPHY N/A 12/26/2021   Procedure: LEFT HEART CATH AND CORONARY ANGIOGRAPHY;  Surgeon: Nelva Bush, MD;  Location: [facility];  Service: Cardiovascular;  Laterality: N/A;   LEFT HEART CATHETERIZATION WITH CORONARY ANGIOGRAM N/A 03/28/2013   Procedure: LEFT HEART CATHETERIZATION WITH CORONARY ANGIOGRAM;  Surgeon: Leonie Man, MD;  Location: [facility];  Service: Cardiovascular;  Laterality: N/A;  NM MYOVIEW LTD     PENILE PROSTHESIS IMPLANT N/A 08/16/2015   Procedure: PENILE PROTHESIS INFLATABLE, three piece, Excisional biopsy of Penile ulcer, Penile molding;  Surgeon: Carolan Clines, MD;  [facility];  Service: Urology;  Laterality: N/A;   PENILE PROSTHESIS IMPLANT N/A 12/24/2017   Procedure: REMOVAL AND  REPLACEMENT  COLOPLAST PENILE PROSTHESIS;  Surgeon: Lucas Mallow, MD;  [facility];  Service: Urology;  Laterality: N/A;   QUADRICEPS TENDON REPAIR  07/21/2011   Procedure: REPAIR QUADRICEP TENDON;  Surgeon: Arther Abbott, MD;  [facility];  Service: Orthopedics;  Laterality: Right;   TOENAIL EXCISION     removed [id number]   UMBILICAL HERNIA REPAIR  07/17/2005   [place]     reports that he quit smoking about 11 years ago. His smoking use included  cigarettes. He has a 25.00 pack-year smoking history. He has never used smokeless tobacco. He reports that he does not currently use alcohol. He reports current drug use. Drug: Marijuana.  Allergies  Allergen Reactions   Opana [Oxymorphone Hcl] Itching   Oxymorphone Itching   Tramadol Itching    Family History  Problem Relation Age of Onset   Hypertension Mother        MI   Cancer Mother        breast    Diabetes Mother    Diabetes Father    Hypertension Father    Hypertension Sister    Diabetes Sister    Arthritis Other    Asthma Other    Lung disease Other    Anesthesia problems Neg Hx    Hypotension Neg Hx    Malignant hyperthermia Neg Hx    Pseudochol deficiency Neg Hx    Colon cancer Neg Hx     Prior to Admission medications   Medication Sig Start Date End Date Taking? Authorizing Provider  albuterol (VENTOLIN HFA) 108 (90 Base) MCG/ACT inhaler Inhale 2 puffs into the lungs every 6 (six) hours as needed for wheezing or shortness of breath. 03/25/21  Yes Rigoberto Noel, MD  aspirin EC 81 MG tablet Take 81 mg by mouth daily. Swallow whole.   Yes [provider]  calcitRIOL (ROCALTROL) 0.25 MCG capsule Take 0.25 mcg by mouth daily.   Yes [provider]  insulin glargine (LANTUS SOLOSTAR) 100 UNIT/ML Solostar Pen Inject 22 Units into the skin at bedtime. 01/10/22  Yes Brita Romp, NP  isosorbide mononitrate (IMDUR) 60 MG 24 hr tablet Take 1 tablet (60 mg total) by mouth daily. 01/11/22  Yes Almyra Deforest, PA  losartan (COZAAR) 25 MG tablet Take 1 tablet (25 mg total) by mouth daily. 09/01/21 01/18/22 Yes Swinyer, Lanice Schwab, NP  nitroGLYCERIN (NITROSTAT) 0.4 MG SL tablet Place 1 tablet (0.4 mg total) under the tongue every 5 (five) minutes as needed for chest pain. 12/14/21  Yes Maudie Flakes, MD  pantoprazole (PROTONIX) 40 MG tablet Take 1 tablet (40 mg total) by mouth 2 (two) times daily before a meal. 07/25/21  Yes Erenest Rasher, PA-C  sevelamer (RENAGEL)  800 MG tablet Take 2,400 mg by mouth 3 (three) times daily with meals.   Yes [provider]  simvastatin (ZOCOR) 20 MG tablet Take 20 mg by mouth every morning.   Yes [provider]  ticagrelor (BRILINTA) 90 MG TABS tablet Take 1 tablet (90 mg total) by mouth 2 (two) times daily. 12/06/21  Yes Hilty, Nadean Corwin, MD  torsemide (DEMADEX) 100 MG tablet Take 100  mg by mouth daily. 05/03/21  Yes [provider]  TRADJENTA 5 MG TABS tablet TAKE 1 TABLET EVERY DAY 07/19/21  Yes Reardon, Juanetta Beets, NP  Vitamin D, Ergocalciferol, (DRISDOL) 1.25 MG (50000 UNIT) CAPS capsule Take 50,000 Units by mouth every 7 (seven) days. Monday 05/03/21  Yes [provider]  budesonide-formoterol (SYMBICORT) 160-4.5 MCG/ACT inhaler Inhale 2 puffs into the lungs 2 (two) times daily. Patient not taking: Reported on 01/18/2022 04/07/20   Collene Gobble, MD  glucose blood test strip 1 each by Other route 2 (two) times daily. Use as instructed to check blood glucose twice daily 02/22/21   Brita Romp, NP  Insulin Pen Needle (DROPLET PEN NEEDLES) 31G X 5 MM MISC Use to inject insulin daily as directed 12/30/20   Cassandria Anger, MD    Physical Exam: Vitals:   01/18/22 1121 01/18/22 1230 01/18/22 1300 01/18/22 1356  BP: (!) 157/130 94/68 (!) 85/71 109/74  Pulse: (!) 45 72 70 78  Resp:  (!) '23 15 18  '$ Temp: 98.1 F (36.7 C)     TempSrc: Oral     SpO2: 99% 98% 98% 97%  Weight:      Height:       Constitutional: appears overweight, NAD, calm, comfortable Eyes: PERRL, lids and conjunctivae normal ENMT: Mucous membranes are moist. Posterior pharynx clear of any exudate or lesions.  Neck: normal, supple, no masses, no thyromegaly Respiratory: clear to auscultation bilaterally, no wheezing, no crackles. Normal respiratory effort. No accessory muscle use.  Cardiovascular: normal s1, s2 sounds, no murmurs / rubs / gallops. No extremity edema. 2+ pedal pulses. No carotid bruits.   Abdomen: no tenderness, no masses palpated. No hepatosplenomegaly. Bowel sounds positive.  Musculoskeletal: no clubbing / cyanosis. No joint deformity upper and lower extremities. Good ROM, no contractures. Normal muscle tone.  Skin: no rashes, lesions, ulcers. No induration Neurologic: CN 2-12 grossly intact. Sensation intact, DTR normal. Strength 5/5 in all 4.  Psychiatric: Normal judgment and insight. Alert and oriented x 3. Normal mood.   Labs on Admission: I have personally reviewed following labs and imaging studies  CBC: Recent Labs  Lab 01/18/22 1149  WBC 5.5  NEUTROABS 4.1  HGB 10.3*  HCT 32.1*  MCV 94.7  PLT 993   Basic Metabolic Panel: Recent Labs  Lab 01/18/22 1149  NA 135  K 3.9  CL 94*  CO2 29  GLUCOSE 116*  BUN 40*  CREATININE 17.26*  CALCIUM 9.8   GFR: Estimated Creatinine Clearance: 4.2 mL/min (A) (by C-G formula based on SCr of 17.26 mg/dL (H)). Liver Function Tests: Recent Labs  Lab 01/18/22 1149  AST 12*  ALT 13  ALKPHOS 75  BILITOT 0.4  PROT 7.2  ALBUMIN 3.2*   No results for input(s): "LIPASE", "AMYLASE" in the last 168 hours. No results for input(s): "AMMONIA" in the last 168 hours. Coagulation Profile: No results for input(s): "INR", "PROTIME" in the last 168 hours. Cardiac Enzymes: No results for input(s): "CKTOTAL", "CKMB", "CKMBINDEX", "TROPONINI" in the last 168 hours. BNP (last 3 results) No results for input(s): "PROBNP" in the last 8760 hours. HbA1C: No results for input(s): "HGBA1C" in the last 72 hours. CBG: No results for input(s): "GLUCAP" in the last 168 hours. Lipid Profile: No results for input(s): "CHOL", "HDL", "LDLCALC", "TRIG", "CHOLHDL", "LDLDIRECT" in the last 72 hours. Thyroid Function Tests: No results for input(s): "TSH", "T4TOTAL", "FREET4", "T3FREE", "THYROIDAB" in the last 72 hours. Anemia Panel: No results for input(s): "VITAMINB12", "  FOLATE", "FERRITIN", "TIBC", "IRON", "RETICCTPCT" in the last 72  hours. Urine analysis:    Component Value Date/Time   COLORURINE YELLOW 01/15/2014 2320   APPEARANCEUR CLEAR 01/15/2014 2320   LABSPEC 1.010 01/15/2014 2320   PHURINE 5.5 01/15/2014 2320   GLUCOSEU NEGATIVE 01/15/2014 2320   HGBUR TRACE (A) 01/15/2014 2320   BILIRUBINUR NEGATIVE 01/15/2014 2320   KETONESUR NEGATIVE 01/15/2014 2320   PROTEINUR 30 (A) 01/15/2014 2320   UROBILINOGEN 0.2 01/15/2014 2320   NITRITE NEGATIVE 01/15/2014 2320   LEUKOCYTESUR NEGATIVE 01/15/2014 2320    Radiological Exams on Admission: DG Chest Port 1 View  Result Date: 01/18/2022 CLINICAL DATA:  71 year old male presenting for evaluation of shortness of breath. EXAM: PORTABLE CHEST 1 VIEW COMPARISON:  None Available. FINDINGS: The heart size and mediastinal contours are within normal limits. Both lungs are clear. The visualized skeletal structures are unremarkable. IMPRESSION: No active disease. Electronically Signed   By: Zetta Bills M.D.   On: 01/18/2022 12:08    EKG: Independently reviewed. First degree AVB  Assessment/Plan Principal Problem:   Chest pain Active Problems:   Diabetes mellitus with stage 5 chronic kidney disease (HCC)   Hyperlipidemia   Essential hypertension   GERD   Fatty liver   Normocytic anemia   OSA on CPAP   Vitamin D deficiency   CAD (coronary artery disease)   Morbid obesity due to excess calories (HCC)   End stage renal disease (HCC)   Unstable angina (HCC)   Second degree AV block, Mobitz type I   Assessment and Plan: * Chest pain -- due to unstable angina -- resume home imdur 60 mg daily  -- further recommendations pending cardiology consultation  -- IV pain management as needed   Unstable angina ([place]) -- ongoing symptoms of chest pain, admit for observation to rule out ACS  End stage renal disease ([place]) -- on PD, will have to go to [facility] due to no PD service at [facility]  -- will notify Dr. Hollie Salk of his admission and going to [facility]  Morbid obesity  due to excess calories ([place]) -- Pt has started walking daily for weight loss   CAD (coronary artery disease) -- further recs to follow from cardiology consultation   Vitamin D deficiency -- he is on weekly supplement  OSA on CPAP -- will offer nightly cpap while in hospital   Normocytic anemia -- stable from recent tests  Fatty liver -- stable, outpatient follow up with GI clinic   GERD -- protonix ordered for GI protection   Essential hypertension -- resume all home cardiac medications  Hyperlipidemia -- resume home simvastatin daily   Diabetes mellitus with stage 5 chronic kidney disease ([place]) -- resume home basal insulin, tradjenta and prandial coverage.  His BS has been well managed and he denies hypoglycemia, A1c 6.3% as recently tested in June 2023.    DVT prophylaxis: SQ heparin   Code Status: full   Family Communication: wife at bedside updated 7/5  Disposition Plan: tx to [facility] called: cardiology   Admission status: OBS   Level of care: Telemetry Cardiac [name] Brakeman MD [facility] How to contact the [facility] Attending or Consulting provider [place] or covering provider during after hours [place], for this patient?  Check the care team in [facility] and look for a) attending/consulting [facility] provider listed and b) the [facility] team listed Log into [email or web address] and use [place]'s universal password to access. If you do not  have the password, please contact the hospital operator. Locate the [facility] provider you are looking for under [facility] and page to a number that you can be directly reached. If you still have difficulty reaching the provider, please page the [facility] (Director on Call) for the Hospitalists listed on amion for assistance.   If 7PM-7AM, please contact night-coverage [email or web address] Password [email or web address]  01/18/2022, 2:56 PM

## 2022-01-18 NOTE — Assessment & Plan Note (Signed)
--   stable from recent tests

## 2022-01-18 NOTE — Progress Notes (Signed)
ANTICOAGULATION CONSULT NOTE - Initial Consult  Pharmacy Consult for heparin gtt  Indication: chest pain/ACS  Allergies  Allergen Reactions   Opana [Oxymorphone Hcl] Itching   Oxymorphone Itching   Tramadol Itching    Patient Measurements: Height: '5\' 4"'$  (162.6 cm) Weight: 101.6 kg (224 lb) IBW/kg (Calculated) : 59.2 Heparin Dosing Weight: HEPARIN DW (KG): 82.3   Vital Signs: Temp: 98.1 F (36.7 C) (07/05 1121) Temp Source: Oral (07/05 1121) BP: 109/74 (07/05 1356) Pulse Rate: 78 (07/05 1356)  Labs: Recent Labs    01/18/22 1149 01/18/22 1436  HGB 10.3* 9.8*  HCT 32.1* 30.7*  PLT 155 159  CREATININE 17.26* 17.08*    Estimated Creatinine Clearance: 4.3 mL/min (A) (by C-G formula based on SCr of 17.08 mg/dL (H)).   Medical History: Past Medical History:  Diagnosis Date   Anemia    Arthritis    Asthma    Bell palsy    CAD (coronary artery disease)    a. 2014 MV: abnl w/ infap ischemia; b. 03/2013 Cath: aneurysmal bleb in the LAD w/ otw nonobs dzs-->Med Rx.   Chronic back pain    Chronic knee pain    a. 09/2015 s/p R TKA.   Chronic pain    Chronic shoulder pain    Chronic sinusitis    COPD (chronic obstructive pulmonary disease) (HCC)    Diabetes mellitus without complication (HCC)    type II    ESRD on peritoneal dialysis ([place])    on peritoneal dialysis, [facility]   Essential hypertension    GERD (gastroesophageal reflux disease)    Gout    Gout    Hepatomegaly    noted on noncontrast CT 2015   History of hiatal hernia    Hyperlipidemia    Lateral meniscus tear    Obesity    Truncal   Obstructive sleep apnea    does not use cpap    On home oxygen therapy    uses 2l when is going somewhere per patient    PUD (peptic ulcer disease)    remote, reports f/u EGD about 8 years ago unremarkable    Reactive airway disease    related to exposure to chemical during 9/11   Sinusitis    Vitamin D deficiency     Medications:  (Not in a hospital  admission)  Scheduled:   aspirin EC  81 mg Oral Daily   [START ON 01/19/2022] calcitRIOL  0.25 mcg Oral Daily   heparin  2,000 Units Intravenous Once   insulin aspart  0-6 Units Subcutaneous TID WC   insulin aspart  3 Units Subcutaneous TID WC   insulin glargine-yfgn  20 Units Subcutaneous QHS   [START ON 01/19/2022] isosorbide mononitrate  60 mg Oral Daily   [START ON 01/19/2022] linagliptin  5 mg Oral Daily   [START ON 01/19/2022] losartan  25 mg Oral Daily   pantoprazole  40 mg Oral BID AC   sevelamer carbonate  2,400 mg Oral TID WC   simvastatin  20 mg Oral q morning   ticagrelor  90 mg Oral BID   torsemide  100 mg Oral Daily   Infusions:   heparin     PRN: acetaminophen, albuterol, nitroGLYCERIN, ondansetron (ZOFRAN) IV Anti-infectives (From admission, onward)    None       Assessment: [name] [name] a 71 y.o. male requires anticoagulation with a heparin iv infusion for the indication of  chest pain/ACS. Heparin gtt will be started following pharmacy protocol per  pharmacy consult. Patient is not on previous oral anticoagulant that will require aPTT/HL correlation before transitioning to only HL monitoring. Patient had 5000 units of subq heparin at 1443 on 7/5, so bolus may need to be reduced prior to infusion.   Goal of Therapy:  Heparin level 0.3-0.7 units/ml Monitor platelets by anticoagulation protocol: Yes   Plan:  Give 2000 units bolus x 1 Start heparin infusion at 900 units/hr Check anti-Xa level in 8 hours and daily while on heparin Continue to monitor H&H and platelets    Donna Christen Kathy Wahid 01/18/2022,4:11 PM

## 2022-01-18 NOTE — Progress Notes (Signed)
Patient request pain medication. Provider notified.

## 2022-01-18 NOTE — Plan of Care (Signed)
  Problem: Health Behavior/Discharge Planning: Goal: Ability to safely manage health-related needs after discharge will improve Outcome: Progressing   

## 2022-01-18 NOTE — Progress Notes (Signed)
Dialysis unit called to get information on patients dialysis tonight. Consent obtained and Dialysis nurse spoke with nephrologist for order and patient for information on his normal dialysis routine.

## 2022-01-18 NOTE — Assessment & Plan Note (Signed)
--   will offer nightly cpap while in hospital

## 2022-01-18 NOTE — Hospital Course (Signed)
71 year old gentleman with end-stage renal disease on nightly peritoneal dialysis, type 2 diabetes mellitus, history of polysubstance abuse including cocaine and marijuana, OSA, extensive coronary artery disease status post recent drug-eluting stent and angioplasty to the RCA had a repeat cardiac catheterization 12/26/2021 which demonstrated severe single-vessel CAD with sequential 90-95% mid RCA stenosis at the site of angioplasty and previously placed RCA stent demonstrated mild to moderate in-stent restenosis in the distal third and patient subsequently had successful DES x2 to mid RCA.  He was also recently found to have an AV block.  His beta-blockers have been discontinued.  During his recent outpatient visit with cardiology his Imdur was increased from 30 mg to 60 mg.  Ports that he has continued to have chest pain since recent stents placed.  He describes the pain and burning sensation in the chest is worse with exertion.  He reports that he walks daily and has been experiencing more chest pain since that time.  He saw his PCP today and there was some concern about bradycardia and also concerning about ongoing chest pain symptoms and he was sent to the emergency department for further evaluation.  Cardiology was contacted by the emergency physician who recommended patient be observed in the hospital for further management due to rising high-sensitivity troponin.  Fortunately his bradycardia has resolved at this time however first-degree AV block noted on EKG and telemetry monitoring.  Patient also reports he had a recently completed outpatient heart monitor study.

## 2022-01-18 NOTE — Assessment & Plan Note (Signed)
--   stable, outpatient follow up with GI clinic

## 2022-01-18 NOTE — Assessment & Plan Note (Signed)
--   he is on weekly supplement

## 2022-01-18 NOTE — Assessment & Plan Note (Signed)
--   ongoing symptoms of chest pain, admit for observation to rule out ACS

## 2022-01-18 NOTE — Progress Notes (Signed)
Patients tropnin 541, provider updated.

## 2022-01-18 NOTE — Assessment & Plan Note (Signed)
--   resume all home cardiac medications

## 2022-01-18 NOTE — Consult Note (Signed)
Cardiology Consultation:   Patient ID: [name] [name] MRN: [id number]; DOB: 06-06-1951  Admit date: 01/18/2022 Date of Consult: 01/18/2022  PCP:  [name] Meiers, MD   Deal Providers Cardiologist:  Pixie Casino, MD        Patient Profile:   [name] [name] is a 71 y.o. male with a hx of CAD (s/p DES to proximal RCA with PTCA of mid and distal RCA in 07/2021, cath in 12/2021 with DESx2 to mid RCA stenoses with residual 80% PDA stenosis), HTN, HLD, OSA and ESRD (on PD) who is being seen 01/18/2022 for the evaluation of chest pain and elevated troponin values at the request of Dr. Roderic Palau.  History of Present Illness:   [name] [name] was most recently admitted to [facility] in 12/2021 for a cardiac catheterization in the setting of having worsening anginal symptoms as an outpatient. He was found to have severe single-vessel CAD with sequential 90 to 95% mid RCA stenoses which was treated with DES x2 in overlapping the previously placed proximal RCA stent. He did have mild to moderate nonobstructive disease in the LAD with continued medical therapy recommended.  He did have episodes of second-degree AV block and an outpatient 14-day Zio patch was recommended. He did follow-up with Almyra Deforest, PA on 01/11/2022 and reported that his chest pain had improved from his hospitalization but he still had an occasional burning pain and was recommended to increase Imdur to 60 mg daily.  He presented to [facility] ED this morning for evaluation of chest pain and bradycardia at the instruction of his PCP. Reports having pain since procedure Pain can be exertional but fairly constant 24/7 no pleurisy. Has not used nitro Has not been wearing his CPAP for last week Needs new mask.   Initial labs showed WBC 5.5, Hgb 10.3 (close to baseline), platelets 155, Na+ 135, K+ 3.9 and creatinine 17.26. Initial Hs Troponin 448 with repeat values pending. CXR shows no active cardiopulmonary disease. EKG shows NSR,  HR 75 with 1st degree AV Block and IVCD with no acute ST changes.   Past Medical History:  Diagnosis Date   Anemia    Arthritis    Asthma    Bell palsy    CAD (coronary artery disease)    a. 2014 MV: abnl w/ infap ischemia; b. 03/2013 Cath: aneurysmal bleb in the LAD w/ otw nonobs dzs-->Med Rx.   Chronic back pain    Chronic knee pain    a. 09/2015 s/p R TKA.   Chronic pain    Chronic shoulder pain    Chronic sinusitis    COPD (chronic obstructive pulmonary disease) ([place])    Diabetes mellitus without complication ([place])    type II    ESRD on peritoneal dialysis ([place])    on peritoneal dialysis, [facility]   Essential hypertension    GERD (gastroesophageal reflux disease)    Gout    Gout    Hepatomegaly    noted on noncontrast CT 2015   History of hiatal hernia    Hyperlipidemia    Lateral meniscus tear    Obesity    Truncal   Obstructive sleep apnea    does not use cpap    On home oxygen therapy    uses 2l when is going somewhere per patient    PUD (peptic ulcer disease)    remote, reports f/u EGD about 8 years ago unremarkable    Reactive airway disease    related to  exposure to chemical during 9/11   Sinusitis    Vitamin D deficiency     Past Surgical History:  Procedure Laterality Date   ASAD LT SHOULDER  12/15/2008   left shoulder   AV FISTULA PLACEMENT Left 08/09/2016   Procedure: BRACHIOCEPHALIC ARTERIOVENOUS (AV) FISTULA CREATION LEFT ARM;  Surgeon: [name], MD;  Location: [place];  Service: Vascular;  Laterality: Left;   CAPD INSERTION N/A 10/07/2018   Procedure: LAPAROSCOPIC PERITONEAL CATHETER PLACEMENT;  Surgeon: Mickeal Skinner, MD;  Location: [facility];  Service: General;  Laterality: N/A;   CATARACT EXTRACTION W/PHACO Left 03/28/2016   Procedure: CATARACT EXTRACTION PHACO AND INTRAOCULAR LENS PLACEMENT LEFT EYE;  Surgeon: [name], MD;  Location: [facility];  Service: Ophthalmology;  Laterality: Left;  CDE: 4.77   CATARACT EXTRACTION  W/PHACO Right 04/11/2016   Procedure: CATARACT EXTRACTION PHACO AND INTRAOCULAR LENS PLACEMENT RIGHT EYE; CDE:  4.74;  Surgeon: [name], MD;  Location: [facility];  Service: Ophthalmology;  Laterality: Right;   COLONOSCOPY  10/15/2008   Fields: Rectal polyp obliterated, not retrieved, hemorrhoids, single ascending colon diverticulum near the CV. Next colonoscopy April 2020   COLONOSCOPY N/A 12/25/2014   SLF: 1. Colorectal polyps (2) removed 2. Small internal hemorrhoids 3. the left colon is severely redundant. hyperplastic polyps   CORONARY STENT INTERVENTION N/A 07/25/2021   Procedure: CORONARY STENT INTERVENTION;  Surgeon: Sherren Mocha, MD;  Location: [place] [facility];  Service: Cardiovascular;  Laterality: N/A;   CORONARY STENT INTERVENTION N/A 12/26/2021   Procedure: CORONARY STENT INTERVENTION;  Surgeon: Nelva Bush, MD;  Location: [place] [facility];  Service: Cardiovascular;  Laterality: N/A;   DOPPLER ECHOCARDIOGRAPHY     ESOPHAGOGASTRODUODENOSCOPY N/A 12/25/2014   SLF: 1. Anemia most likely due to CRI, gastritis, gastric polyps 2. Moderate non-erosive gastriits and mild duodenitis.  3.TWo large gstric polyps removed.    EYE SURGERY  12/22/2010   tear duct probing-[place]   FOREIGN BODY REMOVAL  03/29/2011   Procedure: REMOVAL FOREIGN BODY EXTREMITY;  Surgeon: Arther Abbott, MD;  Location: [facility];  Service: Orthopedics;  Laterality: Right;  Removal Foreign Body Right Thumb   IR FLUORO GUIDE CV LINE RIGHT  08/06/2018   IR US GUIDE VASC ACCESS RIGHT  08/06/2018   KNEE ARTHROSCOPY  10/16/2007   left   KNEE ARTHROSCOPY WITH LATERAL MENISECTOMY Right 10/14/2015   Procedure: LEFT KNEE ARTHROSCOPY WITH PARTIAL LATERAL MENISECTOMY;  Surgeon: Carole Civil, MD;  Location: [facility];  Service: Orthopedics;  Laterality: Right;   LEFT HEART CATH AND CORONARY ANGIOGRAPHY N/A 07/25/2021   Procedure: LEFT HEART CATH AND CORONARY ANGIOGRAPHY;  Surgeon: Sherren Mocha, MD;  Location: [place] [facility];  Service: Cardiovascular;  Laterality: N/A;   LEFT HEART CATH AND CORONARY ANGIOGRAPHY N/A 12/26/2021   Procedure: LEFT HEART CATH AND CORONARY ANGIOGRAPHY;  Surgeon: Nelva Bush, MD;  Location: [place] [facility];  Service: Cardiovascular;  Laterality: N/A;   LEFT HEART CATHETERIZATION WITH CORONARY ANGIOGRAM N/A 03/28/2013   Procedure: LEFT HEART CATHETERIZATION WITH CORONARY ANGIOGRAM;  Surgeon: [place] Man, MD;  [facility] C[facility];  Service: Cardiovascular;  Laterality: N/A;   NM MYOVIEW LTD     PENILE PROSTHESIS IMPLANT N/A 08/16/2015   Procedure: PENILE PROTHESIS INFLATABLE, three piece, Excisional biopsy of Penile ulcer, Penile molding;  Surgeon: Carolan Clines, MD;  [facility];  Service: Urology;  Laterality: N/A;   PENILE PROSTHESIS IMPLANT N/A 12/24/2017   Procedure: REMOVAL AND  REPLACEMENT  COLOPLAST PENILE PROSTHESIS;  Surgeon: Lucas Mallow, MD;  [facility];  Service: Urology;  Laterality: N/A;   QUADRICEPS TENDON REPAIR  07/21/2011   Procedure: REPAIR QUADRICEP TENDON;  Surgeon: Arther Abbott, MD;  Location: [facility];  Service: Orthopedics;  Laterality: Right;   TOENAIL EXCISION     removed [id number]   UMBILICAL HERNIA REPAIR  07/17/2005   [place]     Home Medications:  Prior to Admission medications   Medication Sig Start Date End Date Taking? Authorizing Provider  albuterol (VENTOLIN HFA) 108 (90 Base) MCG/ACT inhaler Inhale 2 puffs into the lungs every 6 (six) hours as needed for wheezing or shortness of breath. 03/25/21  Yes Rigoberto Noel, MD  aspirin EC 81 MG tablet Take 81 mg by mouth daily. Swallow whole.   Yes [provider]  calcitRIOL (ROCALTROL) 0.25 MCG capsule Take 0.25 mcg by mouth daily.   Yes [provider]  insulin glargine (LANTUS SOLOSTAR) 100 UNIT/ML Solostar Pen Inject 22 Units into the skin at bedtime. 01/10/22  Yes Brita Romp, NP  isosorbide mononitrate (IMDUR) 60 MG 24 hr  tablet Take 1 tablet (60 mg total) by mouth daily. 01/11/22  Yes Almyra Deforest, PA  losartan (COZAAR) 25 MG tablet Take 1 tablet (25 mg total) by mouth daily. 09/01/21 01/18/22 Yes Swinyer, Lanice Schwab, NP  nitroGLYCERIN (NITROSTAT) 0.4 MG SL tablet Place 1 tablet (0.4 mg total) under the tongue every 5 (five) minutes as needed for chest pain. 12/14/21  Yes Maudie Flakes, MD  pantoprazole (PROTONIX) 40 MG tablet Take 1 tablet (40 mg total) by mouth 2 (two) times daily before a meal. 07/25/21  Yes Erenest Rasher, PA-C  sevelamer (RENAGEL) 800 MG tablet Take 2,400 mg by mouth 3 (three) times daily with meals.   Yes [provider]  simvastatin (ZOCOR) 20 MG tablet Take 20 mg by mouth every morning.   Yes [provider]  ticagrelor (BRILINTA) 90 MG TABS tablet Take 1 tablet (90 mg total) by mouth 2 (two) times daily. 12/06/21  Yes Hilty, Nadean Corwin, MD  torsemide (DEMADEX) 100 MG tablet Take 100 mg by mouth daily. 05/03/21  Yes [provider]  TRADJENTA 5 MG TABS tablet TAKE 1 TABLET EVERY DAY 07/19/21  Yes Reardon, Juanetta Beets, NP  Vitamin D, Ergocalciferol, (DRISDOL) 1.25 MG (50000 UNIT) CAPS capsule Take 50,000 Units by mouth every 7 (seven) days. Monday 05/03/21  Yes [provider]  budesonide-formoterol (SYMBICORT) 160-4.5 MCG/ACT inhaler Inhale 2 puffs into the lungs 2 (two) times daily. Patient not taking: Reported on 01/18/2022 04/07/20   Collene Gobble, MD  glucose blood test strip 1 each by Other route 2 (two) times daily. Use as instructed to check blood glucose twice daily 02/22/21   Brita Romp, NP  Insulin Pen Needle (DROPLET PEN NEEDLES) 31G X 5 MM MISC Use to inject insulin daily as directed 12/30/20   Cassandria Anger, MD    Inpatient Medications: Scheduled Meds:  Continuous Infusions:  PRN Meds:   Allergies:    Allergies  Allergen Reactions   Opana [Oxymorphone Hcl] Itching   Oxymorphone Itching   Tramadol Itching    Social History:    Social History   Socioeconomic History   Marital status: Married    Spouse name: Not on file   Number of children: 2   Years of education: 12th grade   Highest education level: Not on file  Occupational History   Occupation:  disabled   Occupation:      Fish farm manager: UNEMPLOYED  Tobacco Use   Smoking status: Former    Packs/day: 1.00    Years: 25.00    Total pack years: 25.00    Types: Cigarettes    Quit date: 03/27/2010    Years since quitting: 11.8   Smokeless tobacco: Never   Tobacco comments:    Quit x 7 years  Vaping Use   Vaping Use: Never used  Substance and Sexual Activity   Alcohol use: Not Currently    Comment: occasionally   Drug use: Yes    Types: Marijuana    Comment: cocaine- last time used- 11/24/2017 , marijuana-    Sexual activity: Yes  Other Topics Concern   Not on file  Social History Narrative   He quit smoking in 2010. 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 He developed pulmonary problems, became disabled because of lower airway disease in 2009.       WATCHES BASKETBALL. HIS TEAM IS [place].   Social Determinants of Health   Financial Resource Strain: Low Risk  (12/29/2021)   Overall Financial Resource Strain (CARDIA)    Difficulty of Paying Living Expenses: Not hard at all  Food Insecurity: No Food Insecurity (12/29/2021)   Hunger Vital Sign    Worried About Running Out of Food in the Last Year: Never true    Ran Out of Food in the Last Year: Never true  Transportation Needs: No Transportation Needs (12/29/2021)   PRAPARE - Hydrologist (Medical): No    Lack of Transportation (Non-Medical): No  Physical Activity: Sufficiently Active (12/29/2021)   Exercise Vital Sign    Days of Exercise per Week: 3 days    Minutes of Exercise per Session: 60 min  Stress: No Stress Concern Present (12/29/2021)   [place]     Feeling of Stress : Not at all  Social Connections: [place] (12/29/2021)   Social Connection and Isolation Panel [NHANES]    Frequency of Communication with Friends and Family: More than three times a week    Frequency of Social Gatherings with Friends and Family: More than three times a week    Attends Religious Services: More than 4 times per year    Active Member of [employer] or Organizations: Yes    Attends Music therapist: More than 4 times per year    Marital Status: Married  Human resources officer Violence: Not At Risk (12/29/2021)   Humiliation, Afraid, Rape, and Kick questionnaire    Fear of Current or Ex-Partner: No    Emotionally Abused: No    Physically Abused: No    Sexually Abused: No    Family History:    Family History  Problem Relation Age of Onset   Hypertension Mother        MI   Cancer Mother        breast    Diabetes Mother    Diabetes Father    Hypertension Father    Hypertension Sister    Diabetes Sister    Arthritis Other    Asthma Other    Lung disease Other    Anesthesia problems Neg Hx    Hypotension Neg Hx    Malignant hyperthermia Neg Hx    Pseudochol deficiency Neg Hx    Colon cancer Neg Hx      ROS:  Please see the  history of present illness.   All other ROS reviewed and negative.     Physical Exam/Data:   Vitals:   01/18/22 1121 01/18/22 1230 01/18/22 1300 01/18/22 1356  BP: (!) 157/130 94/68 (!) 85/71 109/74  Pulse: (!) 45 72 70 78  Resp:  (!) '23 15 18  '$ Temp: 98.1 F (36.7 C)     TempSrc: Oral     SpO2: 99% 98% 98% 97%  Weight:      Height:       No intake or output data in the 24 hours ending 01/18/22 1419    01/18/2022   11:21 AM 01/11/2022    3:41 PM 01/11/2022    9:55 AM  Last 3 Weights  Weight (lbs) 224 lb 224 lb 12.8 oz 224 lb 3.2 oz  Weight (kg) 101.606 kg 101.969 kg 101.696 kg     Body mass index is 38.45 kg/m.   Obese black male Lungs clear Heart SEM no rub Abdomen benign peritoneal dialysis  catheter in LUQ Non functional fistula with no thrill in LUE Trace LE edema   EKG:  The EKG was personally reviewed and demonstrates: NSR, HR 75 with 1st degree AV Block and IVCD with no acute ST changes.   Relevant CV Studies:  LHC: 12/2021 Conclusions: Severe single-vessel coronary artery disease with sequential 90-95% mid RCA stenoses at sites of angioplasty in 07/2021.  Previously placed proximal RCA stent demonstrates mild-moderate in-stent restenosis in its distal third.  There is also 80% lesion in the distal rPDA. Stable appearance of mild-moderate, nonobstructive CAD involving the left coronary artery. Mildly elevated left ventricular filling pressure (LVEDP 15-20 mmHg). Successful PCI to sequential mid RCA stenoses using overlapping Onyx Frontier 3.5 x 18 mm and 3.5 x 26 mm drug-eluting stents (more proximal stent also overlaps previously placed stent) with 0% residual stenosis and TIMI-3 flow.   Recommendations: Admit for overnight extended recovery. Consult nephrology to assist with continuation of home PD regimen. Obtain echocardiogram to reassess LVEF. Continue dual antiplatelet therapy with aspirin and ticagrelor for at least 12 months from NSTEMI in 07/2021, though ideally indefinite. Aggressive secondary prevention.  Echocardiogram: 12/2021 IMPRESSIONS     1. Left ventricular ejection fraction, by estimation, is 60 to 65%. The  left ventricle has normal function. The left ventricle has no regional  wall motion abnormalities. There is mild left ventricular hypertrophy.  Left ventricular diastolic parameters  are indeterminate.   2. Right ventricular systolic function is normal. The right ventricular  size is normal. There is normal pulmonary artery systolic pressure. The  estimated right ventricular systolic pressure is 27.2 mmHg.   3. Left atrial size was mildly dilated.   4. Right atrial size was mildly dilated.   5. The mitral valve is degenerative. Trivial mitral  valve regurgitation.  No evidence of mitral stenosis. Moderate mitral annular calcification.   6. The aortic valve is tricuspid. Aortic valve regurgitation is mild.  Mild to moderate aortic valve stenosis. Vmax 2.8 m/s, MG 80mHg, AVA 1.5  cm^2, DI 0.46   7. The inferior vena cava is normal in size with greater than 50%  respiratory variability, suggesting right atrial pressure of 3 mmHg.   Laboratory Data:  High Sensitivity Troponin:   Recent Labs  Lab 01/18/22 1149  TROPONINIHS 448*     Chemistry Recent Labs  Lab 01/18/22 1149  NA 135  K 3.9  CL 94*  CO2 29  GLUCOSE 116*  BUN 40*  CREATININE 17.26*  CALCIUM 9.8  GFRNONAA 3*  ANIONGAP 12    Recent Labs  Lab 01/18/22 1149  PROT 7.2  ALBUMIN 3.2*  AST 12*  ALT 13  ALKPHOS 75  BILITOT 0.4   Lipids No results for input(s): "CHOL", "TRIG", "HDL", "LABVLDL", "LDLCALC", "CHOLHDL" in the last 168 hours.  Hematology Recent Labs  Lab 01/18/22 1149  WBC 5.5  RBC 3.39*  HGB 10.3*  HCT 32.1*  MCV 94.7  MCH 30.4  MCHC 32.1  RDW 14.7  PLT 155   Thyroid No results for input(s): "TSH", "FREET4" in the last 168 hours.  BNPNo results for input(s): "BNP", "PROBNP" in the last 168 hours.  DDimer No results for input(s): "DDIMER" in the last 168 hours.   Radiology/Studies:  DG Chest Port 1 View  Result Date: 01/18/2022 CLINICAL DATA:  71 year old male presenting for evaluation of shortness of breath. EXAM: PORTABLE CHEST 1 VIEW COMPARISON:  None Available. FINDINGS: The heart size and mediastinal contours are within normal limits. Both lungs are clear. The visualized skeletal structures are unremarkable. IMPRESSION: No active disease. Electronically Signed   By: Zetta Bills M.D.   On: 01/18/2022 12:08     Assessment and Plan:   Chest Pain: constant since procedure I reviewed his angiogram and had complex staged intervention to long area in RCA there was a distal area of lucency and some aneurysmal stenosis in mid PDA  where distal wire tip was No obvious distal embolus or wire dissection. Since he is on PD will need to be transported to [facility] for any admission. Trend troponins if continues to rise will need a re look cath this admission. ECG non acute start heparin Discussed transfer and possible need for repeat cath and patient / wife ok with this CXR NAD Hist enzymes were 1114 during original MI/intervention 07/29/21 but were done to 53 on 5/32/23. Lower possibility of myopericarditis Consider f/u TTE at [facility] as well    Risk Assessment/Risk Scores:     TIMI Risk Score for Unstable Angina or Non-ST Elevation MI:   The patient's TIMI risk score is 6, which indicates a 41% risk of all cause mortality, new or recurrent myocardial infarction or need for urgent revascularization in the next 14 days.   For questions or updates, please contact [place] Please consult [email or web address] for contact info under

## 2022-01-18 NOTE — Progress Notes (Signed)
Spoke with nephrologist, PD will start tomorrow. Will notify patient.

## 2022-01-18 NOTE — Assessment & Plan Note (Signed)
--   resume home basal insulin, tradjenta and prandial coverage.  His BS has been well managed and he denies hypoglycemia, A1c 6.3% as recently tested in June 2023.

## 2022-01-18 NOTE — ED Notes (Signed)
Date and time results received: 01/18/22 1510 (use smartphrase ".now" to insert current time)  Test: troponin Critical Value: 506  Name of Provider Notified: Dr. Wynetta Emery  Orders Received? Or Actions Taken?: no/na

## 2022-01-18 NOTE — ED Notes (Signed)
Attempted to call report to receiving facility, nurse stated she just had 2 admissions and would call back for report.

## 2022-01-18 NOTE — Assessment & Plan Note (Signed)
--   further recs to follow from cardiology consultation

## 2022-01-18 NOTE — Assessment & Plan Note (Signed)
--   on PD, will have to go to [facility] due to no PD service at [facility]  -- will notify Dr. Hollie Salk of his admission and going to [facility]

## 2022-01-18 NOTE — Assessment & Plan Note (Signed)
--   Pt has started walking daily for weight loss

## 2022-01-18 NOTE — ED Provider Notes (Signed)
[facility] EMERGENCY DEPARTMENT Provider Note   CSN: [id number] Arrival date & time: 01/18/22  1110     History  Chief Complaint  Patient presents with   Chest Pain    [name] [name] is a 71 y.o. male.  Patient has history of coronary artery disease and renal failure.  He does peritoneal dialysis.  Patient was seen by his doctor today and told him about the chest pain he has been having for the last month.  The history is provided by the patient and medical records. No language interpreter was used.  Chest Pain Pain location:  L chest Pain quality: aching   Pain radiates to:  Does not radiate Pain severity:  Mild Onset quality:  Gradual Duration:  1 month Timing:  Constant Progression:  Unchanged Chronicity:  New Relieved by:  Nothing Associated symptoms: no abdominal pain, no back pain, no cough, no fatigue and no headache        Home Medications Prior to Admission medications   Medication Sig Start Date End Date Taking? Authorizing Provider  albuterol (VENTOLIN HFA) 108 (90 Base) MCG/ACT inhaler Inhale 2 puffs into the lungs every 6 (six) hours as needed for wheezing or shortness of breath. 03/25/21  Yes Rigoberto Noel, MD  aspirin EC 81 MG tablet Take 81 mg by mouth daily. Swallow whole.   Yes [provider]  calcitRIOL (ROCALTROL) 0.25 MCG capsule Take 0.25 mcg by mouth daily.   Yes [provider]  insulin glargine (LANTUS SOLOSTAR) 100 UNIT/ML Solostar Pen Inject 22 Units into the skin at bedtime. 01/10/22  Yes Brita Romp, NP  isosorbide mononitrate (IMDUR) 60 MG 24 hr tablet Take 1 tablet (60 mg total) by mouth daily. 01/11/22  Yes Almyra Deforest, PA  losartan (COZAAR) 25 MG tablet Take 1 tablet (25 mg total) by mouth daily. 09/01/21 01/18/22 Yes Swinyer, Lanice Schwab, NP  nitroGLYCERIN (NITROSTAT) 0.4 MG SL tablet Place 1 tablet (0.4 mg total) under the tongue every 5 (five) minutes as needed for chest pain. 12/14/21  Yes Maudie Flakes, MD   pantoprazole (PROTONIX) 40 MG tablet Take 1 tablet (40 mg total) by mouth 2 (two) times daily before a meal. 07/25/21  Yes Erenest Rasher, PA-C  sevelamer (RENAGEL) 800 MG tablet Take 2,400 mg by mouth 3 (three) times daily with meals.   Yes [provider]  simvastatin (ZOCOR) 20 MG tablet Take 20 mg by mouth every morning.   Yes [provider]  ticagrelor (BRILINTA) 90 MG TABS tablet Take 1 tablet (90 mg total) by mouth 2 (two) times daily. 12/06/21  Yes Hilty, Nadean Corwin, MD  torsemide (DEMADEX) 100 MG tablet Take 100 mg by mouth daily. 05/03/21  Yes [provider]  TRADJENTA 5 MG TABS tablet TAKE 1 TABLET EVERY DAY 07/19/21  Yes Reardon, Juanetta Beets, NP  Vitamin D, Ergocalciferol, (DRISDOL) 1.25 MG (50000 UNIT) CAPS capsule Take 50,000 Units by mouth every 7 (seven) days. Monday 05/03/21  Yes [provider]  budesonide-formoterol (SYMBICORT) 160-4.5 MCG/ACT inhaler Inhale 2 puffs into the lungs 2 (two) times daily. Patient not taking: Reported on 01/18/2022 04/07/20   Collene Gobble, MD  glucose blood test strip 1 each by Other route 2 (two) times daily. Use as instructed to check blood glucose twice daily 02/22/21   Brita Romp, NP  Insulin Pen Needle (DROPLET PEN NEEDLES) 31G X 5 MM MISC Use to inject insulin daily as directed 12/30/20   Loni Beckwith  W, MD      Allergies    Opana [oxymorphone hcl], Oxymorphone, and Tramadol    Review of Systems   Review of Systems  Constitutional:  Negative for appetite change and fatigue.  HENT:  Negative for congestion, ear discharge and sinus pressure.   Eyes:  Negative for discharge.  Respiratory:  Negative for cough.   Cardiovascular:  Positive for chest pain.  Gastrointestinal:  Negative for abdominal pain and diarrhea.  Genitourinary:  Negative for frequency and hematuria.  Musculoskeletal:  Negative for back pain.  Skin:  Negative for rash.  Neurological:  Negative for seizures and headaches.   Psychiatric/Behavioral:  Negative for hallucinations.     Physical Exam Updated Vital Signs BP 109/74 (BP Location: Right Arm)   Pulse 78   Temp 98.1 F (36.7 C) (Oral)   Resp 18   Ht '5\' 4"'$  (1.626 m)   Wt 101.6 kg   SpO2 97%   BMI 38.45 kg/m  Physical Exam Vitals and nursing note reviewed.  Constitutional:      Appearance: He is well-developed.  HENT:     Head: Normocephalic.     Mouth/Throat:     Mouth: Mucous membranes are moist.  Eyes:     General: No scleral icterus.    Conjunctiva/sclera: Conjunctivae normal.  Neck:     Thyroid: No thyromegaly.  Cardiovascular:     Rate and Rhythm: Normal rate and regular rhythm.     Heart sounds: No murmur heard.    No friction rub. No gallop.  Pulmonary:     Breath sounds: No stridor. No wheezing or rales.  Chest:     Chest wall: No tenderness.  Abdominal:     General: There is no distension.     Tenderness: There is no abdominal tenderness. There is no rebound.  Musculoskeletal:        General: Normal range of motion.     Cervical back: Neck supple.  Lymphadenopathy:     Cervical: No cervical adenopathy.  Skin:    Findings: No erythema or rash.  Neurological:     Mental Status: He is alert and oriented to person, place, and time.     Motor: No abnormal muscle tone.     Coordination: Coordination normal.  Psychiatric:        Behavior: Behavior normal.     ED Results / Procedures / Treatments   Labs (all labs ordered are listed, but only abnormal results are displayed) Labs Reviewed  CBC WITH DIFFERENTIAL/PLATELET - Abnormal; Notable for the following components:      Result Value   RBC 3.39 (*)    Hemoglobin 10.3 (*)    HCT 32.1 (*)    Lymphs Abs 0.6 (*)    All other components within normal limits  COMPREHENSIVE METABOLIC PANEL - Abnormal; Notable for the following components:   Chloride 94 (*)    Glucose, Bld 116 (*)    BUN 40 (*)    Creatinine, Ser 17.26 (*)    Albumin 3.2 (*)    AST 12 (*)    GFR,  Estimated 3 (*)    All other components within normal limits  TROPONIN I (HIGH SENSITIVITY) - Abnormal; Notable for the following components:   Troponin I (High Sensitivity) 448 (*)    All other components within normal limits  TROPONIN I (HIGH SENSITIVITY)    EKG EKG Interpretation  Date/Time:  Wednesday January 18 2022 11:24:40 EDT Ventricular Rate:  75 PR Interval:  272 QRS  Duration: 122 QT Interval:  426 QTC Calculation: 475 R Axis:   -55 Text Interpretation: Sinus rhythm with marked sinus arrhythmia with 1st degree A-V block Left axis deviation Non-specific intra-ventricular conduction delay Nonspecific ST and T wave abnormality Abnormal ECG When compared with ECG of 27-Dec-2021 04:55, Sinus rhythm is no longer with 2nd degree A-V block (Mobitz I) Non-specific intra-ventricular conduction delay has replaced Incomplete left bundle branch block Confirmed by Milton Ferguson [phone]) on 01/18/2022 11:46:43 AM  Radiology DG Chest Port 1 View  Result Date: 01/18/2022 CLINICAL DATA:  71 year old male presenting for evaluation of shortness of breath. EXAM: PORTABLE CHEST 1 VIEW COMPARISON:  None Available. FINDINGS: The heart size and mediastinal contours are within normal limits. Both lungs are clear. The visualized skeletal structures are unremarkable. IMPRESSION: No active disease. Electronically Signed   By: Zetta Bills M.D.   On: 01/18/2022 12:08    Procedures Procedures    Medications Ordered in ED Medications - No data to display  ED Course/ Medical Decision Making/ A&P  CRITICAL CARE Performed by: Milton Ferguson Total critical care time: 40 minutes Critical care time was exclusive of separately billable procedures and treating other patients. Critical care was necessary to treat or prevent imminent or life-threatening deterioration. Critical care was time spent personally by me on the following activities: development of treatment plan with patient and/or surrogate as well as  nursing, discussions with consultants, evaluation of patient's response to treatment, examination of patient, obtaining history from patient or surrogate, ordering and performing treatments and interventions, ordering and review of laboratory studies, ordering and review of radiographic studies, pulse oximetry and re-evaluation of patient's condition.                          Medical Decision Making Amount and/or Complexity of Data Reviewed Labs: ordered. Radiology: ordered.  Risk Decision regarding hospitalization.  This patient presents to the ED for concern of chest pain, this involves an extensive number of treatment options, and is a complaint that carries with it a high risk of complications and morbidity.  The differential diagnosis includes coronary artery disease, chest wall pain   Co morbidities that complicate the patient evaluation  Peritoneal dialysis   Additional history obtained:  Additional history obtained from patient External records from outside source obtained and reviewed including full rec   Lab Tests:  I Ordered, and personally interpreted labs.  The pertinent results include: Troponin 48, creatinine 17.2  Imaging Studies ordered:  I ordered imaging studies including chest x-ray I independently visualized and interpreted imaging which showed unremarkable I agree with the radiologist interpretation   Cardiac Monitoring: / EKG:  The patient was maintained on a cardiac monitor.  I personally viewed and interpreted the cardiac monitored which showed an underlying rhythm of: Normal sinus rhythm   Consultations Obtained:  I requested consultation with the cardiology and hospitalist,  and discussed lab and imaging findings as well as pertinent plan - they recommend: Admit for observation   Problem List / ED Course / Critical interventions / Medication management  Coronary artery disease and kidney disease I ordered medication including no medicines  given Reevaluation of the patient after these medicines showed that the patient stayed the same I have reviewed the patients home medicines and have made adjustments as needed   Social Determinants of Health:  None   Test / Admission - Considered:  none  Patient with an elevated troponin with chronic chest discomfort and  coronary artery disease along with renal failure.  He will be admitted to medicine with cardiology consult        Final Clinical Impression(s) / ED Diagnoses Final diagnoses:  NSTEMI (non-ST elevated myocardial infarction) [facility])    Rx / [place] Orders ED Discharge Orders     None         Milton Ferguson, MD 01/18/22 1812

## 2022-01-18 NOTE — Assessment & Plan Note (Addendum)
--   due to unstable angina -- resume home imdur 60 mg daily  -- further recommendations pending cardiology consultation  -- IV pain management as needed

## 2022-01-18 NOTE — Assessment & Plan Note (Signed)
--  protonix ordered for GI protection  ?

## 2022-01-18 NOTE — Assessment & Plan Note (Signed)
--   resume home simvastatin daily

## 2022-01-18 NOTE — Progress Notes (Signed)
EKG done, provider notified.

## 2022-01-18 NOTE — ED Triage Notes (Signed)
Pt referred from Dr. Legrand Rams for chest pain and bradycardia. Chest pain since June 12th.

## 2022-01-18 NOTE — Progress Notes (Signed)
Arrived via [place] from [place] notified. Vital signs per protocol.   Standby transfers.

## 2022-01-18 NOTE — ED Notes (Signed)
Pt ambulated to the restroom x 1 assist, no difficulty walking, RN stood by and escorted pt for safety.

## 2022-01-18 NOTE — Progress Notes (Signed)
Patient did not want to wear cpap tonight, respiratory notified. Will monitor.

## 2022-01-18 NOTE — ED Notes (Signed)
Pt given water, saltine crackers and graham crackers.

## 2022-01-19 ENCOUNTER — Observation Stay (HOSPITAL_COMMUNITY): Payer: Medicare Other

## 2022-01-19 DIAGNOSIS — T82855A Stenosis of coronary artery stent, initial encounter: Secondary | ICD-10-CM | POA: Diagnosis present

## 2022-01-19 DIAGNOSIS — I120 Hypertensive chronic kidney disease with stage 5 chronic kidney disease or end stage renal disease: Secondary | ICD-10-CM | POA: Diagnosis present

## 2022-01-19 DIAGNOSIS — Z955 Presence of coronary angioplasty implant and graft: Secondary | ICD-10-CM | POA: Diagnosis not present

## 2022-01-19 DIAGNOSIS — E1122 Type 2 diabetes mellitus with diabetic chronic kidney disease: Secondary | ICD-10-CM | POA: Diagnosis present

## 2022-01-19 DIAGNOSIS — R079 Chest pain, unspecified: Secondary | ICD-10-CM | POA: Diagnosis not present

## 2022-01-19 DIAGNOSIS — G4733 Obstructive sleep apnea (adult) (pediatric): Secondary | ICD-10-CM | POA: Diagnosis present

## 2022-01-19 DIAGNOSIS — K760 Fatty (change of) liver, not elsewhere classified: Secondary | ICD-10-CM | POA: Diagnosis present

## 2022-01-19 DIAGNOSIS — N186 End stage renal disease: Secondary | ICD-10-CM | POA: Diagnosis present

## 2022-01-19 DIAGNOSIS — D649 Anemia, unspecified: Secondary | ICD-10-CM | POA: Diagnosis not present

## 2022-01-19 DIAGNOSIS — I441 Atrioventricular block, second degree: Secondary | ICD-10-CM

## 2022-01-19 DIAGNOSIS — I2510 Atherosclerotic heart disease of native coronary artery without angina pectoris: Secondary | ICD-10-CM | POA: Diagnosis not present

## 2022-01-19 DIAGNOSIS — I208 Other forms of angina pectoris: Secondary | ICD-10-CM

## 2022-01-19 DIAGNOSIS — Z992 Dependence on renal dialysis: Secondary | ICD-10-CM | POA: Diagnosis not present

## 2022-01-19 DIAGNOSIS — I214 Non-ST elevation (NSTEMI) myocardial infarction: Secondary | ICD-10-CM | POA: Diagnosis present

## 2022-01-19 DIAGNOSIS — D509 Iron deficiency anemia, unspecified: Secondary | ICD-10-CM | POA: Diagnosis not present

## 2022-01-19 DIAGNOSIS — G8929 Other chronic pain: Secondary | ICD-10-CM | POA: Diagnosis present

## 2022-01-19 DIAGNOSIS — E559 Vitamin D deficiency, unspecified: Secondary | ICD-10-CM | POA: Diagnosis present

## 2022-01-19 DIAGNOSIS — I200 Unstable angina: Secondary | ICD-10-CM | POA: Diagnosis not present

## 2022-01-19 DIAGNOSIS — I252 Old myocardial infarction: Secondary | ICD-10-CM | POA: Diagnosis not present

## 2022-01-19 DIAGNOSIS — Y831 Surgical operation with implant of artificial internal device as the cause of abnormal reaction of the patient, or of later complication, without mention of misadventure at the time of the procedure: Secondary | ICD-10-CM | POA: Diagnosis present

## 2022-01-19 DIAGNOSIS — Z6838 Body mass index (BMI) 38.0-38.9, adult: Secondary | ICD-10-CM | POA: Diagnosis not present

## 2022-01-19 DIAGNOSIS — I10 Essential (primary) hypertension: Secondary | ICD-10-CM | POA: Diagnosis not present

## 2022-01-19 DIAGNOSIS — I25118 Atherosclerotic heart disease of native coronary artery with other forms of angina pectoris: Secondary | ICD-10-CM

## 2022-01-19 DIAGNOSIS — E785 Hyperlipidemia, unspecified: Secondary | ICD-10-CM | POA: Diagnosis present

## 2022-01-19 DIAGNOSIS — M109 Gout, unspecified: Secondary | ICD-10-CM | POA: Diagnosis present

## 2022-01-19 DIAGNOSIS — Z885 Allergy status to narcotic agent status: Secondary | ICD-10-CM | POA: Diagnosis not present

## 2022-01-19 DIAGNOSIS — K219 Gastro-esophageal reflux disease without esophagitis: Secondary | ICD-10-CM | POA: Diagnosis present

## 2022-01-19 DIAGNOSIS — K279 Peptic ulcer, site unspecified, unspecified as acute or chronic, without hemorrhage or perforation: Secondary | ICD-10-CM | POA: Diagnosis present

## 2022-01-19 DIAGNOSIS — N185 Chronic kidney disease, stage 5: Secondary | ICD-10-CM | POA: Diagnosis not present

## 2022-01-19 DIAGNOSIS — I25110 Atherosclerotic heart disease of native coronary artery with unstable angina pectoris: Secondary | ICD-10-CM | POA: Diagnosis present

## 2022-01-19 DIAGNOSIS — Z96651 Presence of right artificial knee joint: Secondary | ICD-10-CM | POA: Diagnosis present

## 2022-01-19 DIAGNOSIS — N250 Renal osteodystrophy: Secondary | ICD-10-CM | POA: Diagnosis not present

## 2022-01-19 DIAGNOSIS — D631 Anemia in chronic kidney disease: Secondary | ICD-10-CM | POA: Diagnosis present

## 2022-01-19 DIAGNOSIS — J449 Chronic obstructive pulmonary disease, unspecified: Secondary | ICD-10-CM | POA: Diagnosis present

## 2022-01-19 LAB — CBC
HCT: 29.4 % — ABNORMAL LOW (ref 39.0–52.0)
Hemoglobin: 9.3 g/dL — ABNORMAL LOW (ref 13.0–17.0)
MCH: 29.8 pg (ref 26.0–34.0)
MCHC: 31.6 g/dL (ref 30.0–36.0)
MCV: 94.2 fL (ref 80.0–100.0)
Platelets: 132 10*3/uL — ABNORMAL LOW (ref 150–400)
RBC: 3.12 MIL/uL — ABNORMAL LOW (ref 4.22–5.81)
RDW: 14.5 % (ref 11.5–15.5)
WBC: 5.5 10*3/uL (ref 4.0–10.5)
nRBC: 0 % (ref 0.0–0.2)

## 2022-01-19 LAB — GLUCOSE, CAPILLARY
Glucose-Capillary: 101 mg/dL — ABNORMAL HIGH (ref 70–99)
Glucose-Capillary: 89 mg/dL (ref 70–99)
Glucose-Capillary: 115 mg/dL — ABNORMAL HIGH (ref 70–99)
Glucose-Capillary: 84 mg/dL (ref 70–99)
Glucose-Capillary: 106 mg/dL — ABNORMAL HIGH (ref 70–99)

## 2022-01-19 LAB — ECHOCARDIOGRAM LIMITED
AR max vel: 1.23 cm2
AV Area VTI: 1.17 cm2
AV Area mean vel: 1.61 cm2
AV Mean grad: 9.6 mmHg
AV Peak grad: 22.1 mmHg
Ao pk vel: 2.35 m/s
Height: 64 in
Weight: 3580.8 oz

## 2022-01-19 LAB — HEPARIN LEVEL (UNFRACTIONATED)
Heparin Unfractionated: 0.27 IU/mL — ABNORMAL LOW (ref 0.30–0.70)
Heparin Unfractionated: 0.36 IU/mL (ref 0.30–0.70)

## 2022-01-19 MED ORDER — GENTAMICIN SULFATE 0.1 % EX CREA
1.0000 | TOPICAL_CREAM | Freq: Every day | CUTANEOUS | Status: DC
  Administered 2022-01-21: 1 via TOPICAL
  Filled 2022-01-19 (×2): qty 15

## 2022-01-19 MED ORDER — ISOSORBIDE MONONITRATE ER 60 MG PO TB24
60.0000 mg | ORAL_TABLET | Freq: Once | ORAL | Status: AC
  Administered 2022-01-19: 60 mg via ORAL
  Filled 2022-01-19: qty 1

## 2022-01-19 MED ORDER — IBUPROFEN 600 MG PO TABS
800.0000 mg | ORAL_TABLET | Freq: Once | ORAL | Status: AC
  Administered 2022-01-19: 800 mg via ORAL
  Filled 2022-01-19: qty 1

## 2022-01-19 MED ORDER — SODIUM CHLORIDE 0.9 % WEIGHT BASED INFUSION
3.0000 mL/kg/h | INTRAVENOUS | Status: DC
  Administered 2022-01-20: 3 mL/kg/h via INTRAVENOUS

## 2022-01-19 MED ORDER — RANOLAZINE ER 500 MG PO TB12
500.0000 mg | ORAL_TABLET | Freq: Two times a day (BID) | ORAL | Status: DC
  Administered 2022-01-19: 500 mg via ORAL
  Filled 2022-01-19: qty 1

## 2022-01-19 MED ORDER — SODIUM CHLORIDE 0.9 % IV SOLN: 250.0000 mL | INTRAVENOUS | Status: DC | PRN

## 2022-01-19 MED ORDER — RANOLAZINE ER 500 MG PO TB12
1000.0000 mg | ORAL_TABLET | Freq: Two times a day (BID) | ORAL | Status: DC
Start: 2022-01-19 — End: 2022-01-20
  Administered 2022-01-19: 1000 mg via ORAL
  Filled 2022-01-19 (×2): qty 2

## 2022-01-19 MED ORDER — SODIUM CHLORIDE 0.9% FLUSH
3.0000 mL | Freq: Two times a day (BID) | INTRAVENOUS | Status: DC
  Administered 2022-01-19 – 2022-01-21 (×3): 3 mL via INTRAVENOUS

## 2022-01-19 MED ORDER — ISOSORBIDE MONONITRATE ER 60 MG PO TB24
120.0000 mg | ORAL_TABLET | Freq: Every day | ORAL | Status: DC
  Administered 2022-01-21: 120 mg via ORAL
  Filled 2022-01-19 (×2): qty 2

## 2022-01-19 MED ORDER — SODIUM CHLORIDE 0.9 % WEIGHT BASED INFUSION: 1.0000 mL/kg/h | INTRAVENOUS | Status: DC

## 2022-01-19 MED ORDER — SODIUM CHLORIDE 0.9% FLUSH: 3.0000 mL | INTRAVENOUS | Status: DC | PRN

## 2022-01-19 MED ORDER — DELFLEX-LC/2.5% DEXTROSE 394 MOSM/L IP SOLN: INTRAPERITONEAL | Status: DC

## 2022-01-19 NOTE — Consult Note (Addendum)
   [facility] CM Inpatient Consult   01/19/2022  JAHIEM FRANZONI 11-03-1950 [id number]  [facility] [ACO] Patient: Medicare ACO REACH  Primary Care Provider:  Carrolyn Meiers, MD   Addendum: 01/20/22 1055 am met with the patient at the bedside regarding post hospital follow up, gave an appointment reminder card to PCP and with NP-G, SDOH reviewed and up-to-date, does say things are tight but not out of reach but always willing to get free services.  Patient is currently active with [facility] for chronic disease management services.  Patient has been engaged by a [facility] NP-G.  Our community based plan of care has focused on disease management and community resource support.   Patient is currently less than 30 days readmission    Plan: Follow for progression and transition of care needs for post hospital care.  Of note, [facility] Care Management services does not replace or interfere with any services that are needed or arranged by inpatient [facility] care management team.  For additional questions or referrals please contact:  Natividad Brood, RN BSN [facility] Liaison  [phone] business mobile phone Toll free office [phone]  Fax number: [phone] [email or web address]

## 2022-01-19 NOTE — Progress Notes (Signed)
[facility] PROGRESS NOTE   [name] [name] DVV:[id number] DOB: January 05, 1951 DOA: 01/18/2022  PCP: Carrolyn Meiers, MD  Brief History/Interval Summary: 71 year old gentleman with end-stage renal disease on nightly peritoneal dialysis, type 2 diabetes mellitus, history of polysubstance abuse including cocaine and marijuana, OSA, extensive coronary artery disease status post recent drug-eluting stent and angioplasty to the RCA had a cardiac catheterization 12/26/2021 which demonstrated severe single-vessel CAD with sequential 90-95% mid RCA stenosis at the site of angioplasty and previously placed RCA stent demonstrated mild to moderate in-stent restenosis in the distal third and patient subsequently had successful DES x2 to mid RCA.  He was also recently found to have an AV block.  His beta-blockers have been discontinued.  During his recent outpatient visit with cardiology his Imdur was increased from 30 mg to 60 mg.  Reports that he has continued to have chest pain since recent stents placed.  He was hospitalized for further management.  Consultants: Cardiology.  Nephrology  Procedures: None yet    Subjective/Interval History: Patient mentions that he does not have any chest pain this morning.  No shortness of breath.  No nausea vomiting.     Assessment/Plan:  Chest pain in the setting of known coronary artery disease Patient has had multiple interventions in the last 7 months.  Initial cardiac cath was in January and then subsequently in June.  See cardiology note for details. Troponins noted to be elevated.  Patient remains on heparin infusion.  He is on dual antiplatelet treatment with Brilinta and aspirin.  Statin. Seen by cardiology while he was at [facility].  He was transferred here due to peritoneal dialysis requirements. Cardiology to evaluate today and determine further course of action.  End-stage renal disease on peritoneal dialysis Discussed with  nephrology today who will evaluate.  Followed by Dr. Marval Regal on a regular basis.  Obstructive sleep apnea On CPAP  Normocytic anemia Likely anemia of chronic kidney disease.  Essential hypertension Stable.  Continue home medications.  Noted to be on losartan.  Recent AV block Taken off of AV blocking agents.  Diabetes mellitus in the setting of end-stage renal disease Monitor CBGs.  Noted to be on glargine, Tradjenta.  On SSI.  Morbid obesity Estimated body mass index is 38.42 kg/m as calculated from the following:   Height as of this encounter: '5\' 4"'$  (1.626 m).   Weight as of this encounter: 101.5 kg.   DVT Prophylaxis: On IV heparin Code Status: Full code Family Communication: Discussed with patient Disposition Plan: Hopefully return home in improved  Status is: Observation The patient will require care spanning > 2 midnights and should be moved to inpatient because: Cardiac work-up      Medications: Scheduled:  aspirin EC  81 mg Oral Daily   calcitRIOL  0.25 mcg Oral Daily   insulin aspart  0-6 Units Subcutaneous TID WC   insulin aspart  3 Units Subcutaneous TID WC   insulin glargine-yfgn  20 Units Subcutaneous QHS   isosorbide mononitrate  60 mg Oral Daily   linagliptin  5 mg Oral Daily   losartan  25 mg Oral Daily   pantoprazole  40 mg Oral BID AC   sevelamer carbonate  2,400 mg Oral TID WC   simvastatin  20 mg Oral q morning   ticagrelor  90 mg Oral BID   torsemide  100 mg Oral Daily   Continuous:  heparin 1,000 Units/hr (01/19/22 0523)   GYI:RSWNIOEVOJJKK, albuterol, nitroGLYCERIN, ondansetron (ZOFRAN) IV  Antibiotics: Anti-infectives (From admission, onward)    None       Objective:  Vital Signs  Vitals:   01/18/22 2335 01/19/22 0314 01/19/22 0317 01/19/22 0728  BP: (!) 105/58  139/89 (!) 154/80  Pulse:   80 66  Resp: (!) '22  19 20  '$ Temp:   98 F (36.7 C) 97.8 F (36.6 C)  TempSrc:   Oral Oral  SpO2: 96%  94% 98%  Weight:  101.5  kg    Height:        Intake/Output Summary (Last 24 hours) at 01/19/2022 0959 Last data filed at 01/19/2022 0827 Gross per 24 hour  Intake 608.55 ml  Output 0 ml  Net 608.55 ml   Filed Weights   01/18/22 1121 01/19/22 0314  Weight: 101.6 kg 101.5 kg    General appearance: Awake alert.  In no distress Resp: Clear to auscultation bilaterally.  Normal effort Cardio: S1-S2 is normal regular.  No S3-S4.  No rubs murmurs or bruit GI: Abdomen is soft.  Nontender nondistended.  Bowel sounds are present normal.  No masses organomegaly.  Dialysis catheter noted Extremities: No edema.  Full range of motion of lower extremities. Neurologic: Alert and oriented x3.  No focal neurological deficits.    Lab Results:  Data Reviewed: I have personally reviewed following labs and reports of the imaging studies  CBC: Recent Labs  Lab 01/18/22 1149 01/18/22 1436 01/19/22 0044  WBC 5.5 5.7 5.5  NEUTROABS 4.1  --   --   HGB 10.3* 9.8* 9.3*  HCT 32.1* 30.7* 29.4*  MCV 94.7 95.6 94.2  PLT 155 159 132*    Basic Metabolic Panel: Recent Labs  Lab 01/18/22 1149 01/18/22 1436  NA 135  --   K 3.9  --   CL 94*  --   CO2 29  --   GLUCOSE 116*  --   BUN 40*  --   CREATININE 17.26* 17.08*  CALCIUM 9.8  --     GFR: Estimated Creatinine Clearance: 4.3 mL/min (A) (by C-G formula based on SCr of 17.08 mg/dL (H)).  Liver Function Tests: Recent Labs  Lab 01/18/22 1149  AST 12*  ALT 13  ALKPHOS 75  BILITOT 0.4  PROT 7.2  ALBUMIN 3.2*      CBG: Recent Labs  Lab 01/18/22 1714 01/18/22 2044 01/19/22 0312 01/19/22 0614  GLUCAP 118* 130* 101* 89    Radiology Studies: DG Chest Port 1 View  Result Date: 01/18/2022 CLINICAL DATA:  71 year old male presenting for evaluation of shortness of breath. EXAM: PORTABLE CHEST 1 VIEW COMPARISON:  None Available. FINDINGS: The heart size and mediastinal contours are within normal limits. Both lungs are clear. The visualized skeletal structures  are unremarkable. IMPRESSION: No active disease. Electronically Signed   By: Zetta Bills M.D.   On: 01/18/2022 12:08       LOS: 0 days   [facility] Pager on [email or web address]  01/19/2022, 9:59 AM

## 2022-01-19 NOTE — Progress Notes (Signed)
  Pt c/o of CP.  He had temporary relief after IBU last night.  Pain and burning have returned.  Please advise MD notified

## 2022-01-19 NOTE — Progress Notes (Signed)
Pt states he don't wish to wear CPAP tonight. 

## 2022-01-19 NOTE — Progress Notes (Signed)
Progress Note  Patient Name: [name] [name] Date of Encounter: 01/19/2022  Primary Cardiologist: Pixie Casino, MD   Subjective   Overnight has had improvement in his chest pain but increase in his chest burning. - troponin elevated by flat ~ 500. He notes that he has had two types of chest pain, chest pain and chest burning; they both resolved after PCI and not the chest burning is back.  Inpatient Medications    Scheduled Meds:  aspirin EC  81 mg Oral Daily   calcitRIOL  0.25 mcg Oral Daily   gentamicin cream  1 Application Topical Daily   insulin aspart  0-6 Units Subcutaneous TID WC   insulin aspart  3 Units Subcutaneous TID WC   insulin glargine-yfgn  20 Units Subcutaneous QHS   [START ON 01/20/2022] isosorbide mononitrate  120 mg Oral Daily   isosorbide mononitrate  60 mg Oral Once   linagliptin  5 mg Oral Daily   losartan  25 mg Oral Daily   pantoprazole  40 mg Oral BID AC   ranolazine  500 mg Oral BID   sevelamer carbonate  2,400 mg Oral TID WC   simvastatin  20 mg Oral q morning   ticagrelor  90 mg Oral BID   torsemide  100 mg Oral Daily   Continuous Infusions:  dialysis solution 2.5% low-MG/low-CA     heparin 1,000 Units/hr (01/19/22 0523)   PRN Meds: acetaminophen, albuterol, nitroGLYCERIN, ondansetron (ZOFRAN) IV   Vital Signs    Vitals:   01/19/22 0314 01/19/22 0317 01/19/22 0728 01/19/22 1114  BP:  139/89 (!) 154/80 127/64  Pulse:  80 66 (!) 57  Resp:  '19 20 20  '$ Temp:  98 F (36.7 C) 97.8 F (36.6 C) 97.7 F (36.5 C)  TempSrc:  Oral Oral Oral  SpO2:  94% 98% 96%  Weight: 101.5 kg     Height:        Intake/Output Summary (Last 24 hours) at 01/19/2022 1132 Last data filed at 01/19/2022 0827 Gross per 24 hour  Intake 608.55 ml  Output 0 ml  Net 608.55 ml   Filed Weights   01/18/22 1121 01/19/22 0314  Weight: 101.6 kg 101.5 kg    Telemetry    1st degree HB - Personally Reviewed  Physical Exam   Gen: no distress, morbid  obesity Neck: No JVD Cardiac: No Rubs or Gallops, no Murmur, systolic murmur +2 radial pulses Respiratory: Clear to auscultation bilaterally, normal effort, normal  respiratory rate GI: Soft, nontender, non-distended  MS: No  edema;  moves all extremities Integument: Skin feels warm Neuro:  At time of evaluation, alert and oriented to person/place/time/situation  Psych: Normal affect, patient feels ok   Labs    Chemistry Recent Labs  Lab 01/18/22 1149 01/18/22 1436  NA 135  --   K 3.9  --   CL 94*  --   CO2 29  --   GLUCOSE 116*  --   BUN 40*  --   CREATININE 17.26* 17.08*  CALCIUM 9.8  --   PROT 7.2  --   ALBUMIN 3.2*  --   AST 12*  --   ALT 13  --   ALKPHOS 75  --   BILITOT 0.4  --   GFRNONAA 3* 3*  ANIONGAP 12  --      Hematology Recent Labs  Lab 01/18/22 1149 01/18/22 1436 01/19/22 0044  WBC 5.5 5.7 5.5  RBC 3.39* 3.21* 3.12*  HGB  10.3* 9.8* 9.3*  HCT 32.1* 30.7* 29.4*  MCV 94.7 95.6 94.2  MCH 30.4 30.5 29.8  MCHC 32.1 31.9 31.6  RDW 14.7 14.6 14.5  PLT 155 159 132*    Cardiac EnzymesNo results for input(s): "TROPONINI" in the last 168 hours. No results for input(s): "TROPIPOC" in the last 168 hours.   BNPNo results for input(s): "BNP", "PROBNP" in the last 168 hours.   DDimer No results for input(s): "DDIMER" in the last 168 hours.   Radiology    DG Chest Port 1 View  Result Date: 01/18/2022 CLINICAL DATA:  71 year old male presenting for evaluation of shortness of breath. EXAM: PORTABLE CHEST 1 VIEW COMPARISON:  None Available. FINDINGS: The heart size and mediastinal contours are within normal limits. Both lungs are clear. The visualized skeletal structures are unremarkable. IMPRESSION: No active disease. Electronically Signed   By: Zetta Bills M.D.   On: 01/18/2022 12:08     Patient Profile     71 y.o. male hx of CAD (s/p DES to proximal RCA with PTCA of mid and distal RCA in 07/2021, cath in 12/2021 with DESx2 to mid RCA stenoses with  residual 80% PDA stenosis), HTN, HLD, OSA and ESRD (on PD) who is being seen 01/18/2022 for the evaluation of chest pain and elevated troponin values  Assessment & Plan     Chest pain in the setting of CAD (LHC twice this year with residual PDA stenosis) Peptic ulcer disease Mobtiz Type I HB and bradycardia not on BB for this reason HTN and HLD ESRD on PD Mild- Mod AS, Mild AI - today will attempt aggressive medical therapy for distal RCA disease (Imdur 120 mg PO daily and Ranexa 500) and will repeat limited echocardiogram - continue ASA and Brilinta - continue statin will need high dose risk reduction in future - if new WMA or limited echo or if breakthrough pain despite this will consider 01/20/22 LHC for PDA intervention  NPO at midnight order placed for potential LHC  For questions or updates, please contact [facility] Please consult [email or web address] for contact info under Cardiology/STEMI.      Rudean Haskell, MD [address] [place], [place] [address] [phone]  11:32 AM

## 2022-01-19 NOTE — Progress Notes (Signed)
ANTICOAGULATION CONSULT NOTE - Follow Up Consult  Pharmacy Consult for Heparin Indication: chest pain/ACS  Allergies  Allergen Reactions   Opana [Oxymorphone Hcl] Itching   Oxymorphone Itching   Tramadol Itching    Patient Measurements: Height: '5\' 4"'$  (162.6 cm) Weight: 101.5 kg (223 lb 12.8 oz) IBW/kg (Calculated) : 59.2 Heparin Dosing Weight: 82.3 kg  Vital Signs: Temp: 97.7 F (36.5 C) (07/06 1114) Temp Source: Oral (07/06 1114) BP: 127/64 (07/06 1114) Pulse Rate: 57 (07/06 1114)  Labs: Recent Labs    01/18/22 1149 01/18/22 1349 01/18/22 1436 01/18/22 1807 01/18/22 2017 01/19/22 0044 01/19/22 1013  HGB 10.3*  --  9.8*  --   --  9.3*  --   HCT 32.1*  --  30.7*  --   --  29.4*  --   PLT 155  --  159  --   --  132*  --   HEPARINUNFRC  --   --   --   --   --  0.27* 0.36  CREATININE 17.26*  --  17.08*  --   --   --   --   TROPONINIHS 448* 506*  --  541* 533*  --   --     Estimated Creatinine Clearance: 4.3 mL/min (A) (by C-G formula based on SCr of 17.08 mg/dL (H)).   Medications:  Scheduled:   aspirin EC  81 mg Oral Daily   calcitRIOL  0.25 mcg Oral Daily   gentamicin cream  1 Application Topical Daily   insulin aspart  0-6 Units Subcutaneous TID WC   insulin aspart  3 Units Subcutaneous TID WC   insulin glargine-yfgn  20 Units Subcutaneous QHS   [START ON 01/20/2022] isosorbide mononitrate  120 mg Oral Daily   isosorbide mononitrate  60 mg Oral Once   linagliptin  5 mg Oral Daily   losartan  25 mg Oral Daily   pantoprazole  40 mg Oral BID AC   ranolazine  500 mg Oral BID   sevelamer carbonate  2,400 mg Oral TID WC   simvastatin  20 mg Oral q morning   ticagrelor  90 mg Oral BID   torsemide  100 mg Oral Daily   Infusions:   dialysis solution 2.5% low-MG/low-CA     heparin 1,000 Units/hr (01/19/22 0523)    Assessment: 71 yo M with CAD s/p recent cath with stent placement.  Noted recurrence of chest pain / pressure and presented to [facility] ED.  Patient was  started on heparin infusion and transferred to [facility] for management. Heparin level is at low end of goal on 1000 units/hr.  Will increase rate to target middle of goal range.  Noted plans for aggressive med mgmt and if no improvement possible cardiac cath 01/20/22.  Goal of Therapy:  Heparin level 0.3-0.7 units/ml Monitor platelets by anticoagulation protocol: Yes   Plan:  Increase heparin to 1100 units/hr. Next heparin level with AM labs. Daily heparin level and CBC while on heparin.  [employer], Pharm.D., BCPS Clinical Pharmacist Clinical phone for 01/19/2022 from 7:30-3:00 is [phone].  **Pharmacist phone directory can be found on [email or web address] listed under [place].  01/19/2022 11:48 AM

## 2022-01-19 NOTE — TOC Progression Note (Signed)
Transition of Care [facility]) - Progression Note    Patient Details  Name: [name] [name] MRN: [id number] Date of Birth: May 11, 1951  [facility] CM/SW Contact  Zenon Mayo, RN Phone Number: 01/19/2022, 2:59 PM  Clinical Narrative:    chest pain, had two stents put in in Jan.  Peritoneal dialysis patient , for cath vs TTE, indep. TOC following.        Expected Discharge Plan and Services                                                 Social Determinants of Health (SDOH) Interventions    Readmission Risk Interventions     No data to display

## 2022-01-19 NOTE — Consult Note (Signed)
[facility] Renal Consultation Note    Indication for Consultation:  Management of ESRD/hemodialysis; anemia, hypertension/volume and secondary hyperparathyroidism  HPI: [name] [name] is a 71 y.o. male with a PMH significant for ESRD on CCPD, DM type 2, h/o polysubstance abuse, morbid obesity, OSA, and extensive CAD s/p PCI and stents placed on 12/26/21 who presented to [facility] ED with persistent chest pain since his stents were placed.  He was transferred to [facility] for further cardiac evaluation and we were consulted to provide PD during his hospitalization.  He reports that he is currently chest pain free.  Past Medical History:  Diagnosis Date   Anemia    Arthritis    Asthma    Bell palsy    CAD (coronary artery disease)    a. 2014 MV: abnl w/ infap ischemia; b. 03/2013 Cath: aneurysmal bleb in the LAD w/ otw nonobs dzs-->Med Rx.   Chronic back pain    Chronic knee pain    a. 09/2015 s/p R TKA.   Chronic pain    Chronic shoulder pain    Chronic sinusitis    COPD (chronic obstructive pulmonary disease) ([place])    Diabetes mellitus without complication ([place])    type II    ESRD on peritoneal dialysis ([place])    on peritoneal dialysis, [facility]   Essential hypertension    GERD (gastroesophageal reflux disease)    Gout    Gout    Hepatomegaly    noted on noncontrast CT 2015   History of hiatal hernia    Hyperlipidemia    Lateral meniscus tear    Obesity    Truncal   Obstructive sleep apnea    does not use cpap    On home oxygen therapy    uses 2l when is going somewhere per patient    PUD (peptic ulcer disease)    remote, reports f/u EGD about 8 years ago unremarkable    Reactive airway disease    related to exposure to chemical during 9/11   Sinusitis    Vitamin D deficiency    Past Surgical History:  Procedure Laterality Date   ASAD LT SHOULDER  12/15/2008   left shoulder   AV FISTULA PLACEMENT Left 08/09/2016   Procedure: BRACHIOCEPHALIC  ARTERIOVENOUS (AV) FISTULA CREATION LEFT ARM;  Surgeon: [name], MD;  Location: [place];  Service: Vascular;  Laterality: Left;   CAPD INSERTION N/A 10/07/2018   Procedure: LAPAROSCOPIC PERITONEAL CATHETER PLACEMENT;  Surgeon: Mickeal Skinner, MD;  [facility];  Service: General;  Laterality: N/A;   CATARACT EXTRACTION W/PHACO Left 03/28/2016   Procedure: CATARACT EXTRACTION PHACO AND INTRAOCULAR LENS PLACEMENT LEFT EYE;  Surgeon: [name], MD;  Location: [facility];  Service: Ophthalmology;  Laterality: Left;  CDE: 4.77   CATARACT EXTRACTION W/PHACO Right 04/11/2016   Procedure: CATARACT EXTRACTION PHACO AND INTRAOCULAR LENS PLACEMENT RIGHT EYE; CDE:  4.74;  Surgeon: [name], MD;  Location[facility];  Service: Ophthalmology;  Laterality: Right;   COLONOSCOPY  10/15/2008   Fields: Rectal polyp obliterated, not retrieved, hemorrhoids, single ascending colon diverticulum near the CV. Next colonoscopy April 2020   COLONOSCOPY N/A 12/25/2014   SLF: 1. Colorectal polyps (2) removed 2. Small internal hemorrhoids 3. the left colon is severely redundant. hyperplastic polyps   CORONARY STENT INTERVENTION N/A 07/25/2021   Procedure: CORONARY STENT INTERVENTION;  Surgeon: Sherren Mocha, MD;  Location: [place] CV LAB;  Service: Cardiovascular;  Laterality: N/A;   CORONARY STENT INTERVENTION  N/A 12/26/2021   Procedure: CORONARY STENT INTERVENTION;  Surgeon: Nelva Bush, MD;  Location: [place] CV LAB;  Service: Cardiovascular;  Laterality: N/A;   DOPPLER ECHOCARDIOGRAPHY     ESOPHAGOGASTRODUODENOSCOPY N/A 12/25/2014   SLF: 1. Anemia most likely due to CRI, gastritis, gastric polyps 2. Moderate non-erosive gastriits and mild duodenitis.  3.TWo large gstric polyps removed.    EYE SURGERY  12/22/2010   tear duct probing-[place]   FOREIGN BODY REMOVAL  03/29/2011   Procedure: REMOVAL FOREIGN BODY EXTREMITY;  Surgeon: Arther Abbott, MD;  Location: [facility];  Service: Orthopedics;   Laterality: Right;  Removal Foreign Body Right Thumb   IR FLUORO GUIDE CV LINE RIGHT  08/06/2018   IR US GUIDE VASC ACCESS RIGHT  08/06/2018   KNEE ARTHROSCOPY  10/16/2007   left   KNEE ARTHROSCOPY WITH LATERAL MENISECTOMY Right 10/14/2015   Procedure: LEFT KNEE ARTHROSCOPY WITH PARTIAL LATERAL MENISECTOMY;  Surgeon: Carole Civil, MD;  Location[facility];  Service: Orthopedics;  Laterality: Right;   LEFT HEART CATH AND CORONARY ANGIOGRAPHY N/A 07/25/2021   Procedure: LEFT HEART CATH AND CORONARY ANGIOGRAPHY;  Surgeon: Sherren Mocha, MD;  Location: [place] CV LAB;  Service: Cardiovascular;  Laterality: N/A;   LEFT HEART CATH AND CORONARY ANGIOGRAPHY N/A 12/26/2021   Procedure: LEFT HEART CATH AND CORONARY ANGIOGRAPHY;  Surgeon: Nelva Bush, MD;  Location: [place] CV LAB;  Service: Cardiovascular;  Laterality: N/A;   LEFT HEART CATHETERIZATION WITH CORONARY ANGIOGRAM N/A 03/28/2013   Procedure: LEFT HEART CATHETERIZATION WITH CORONARY ANGIOGRAM;  Surgeon: Leonie Man, MD;  Location: [facility] CATH LAB;  Service: Cardiovascular;  Laterality: N/A;   NM MYOVIEW LTD     PENILE PROSTHESIS IMPLANT N/A 08/16/2015   Procedure: PENILE PROTHESIS INFLATABLE, three piece, Excisional biopsy of Penile ulcer, Penile molding;  Surgeon: Carolan Clines, MD;  [facility];  Service: Urology;  Laterality: N/A;   PENILE PROSTHESIS IMPLANT N/A 12/24/2017   Procedure: REMOVAL AND  REPLACEMENT  COLOPLAST PENILE PROSTHESIS;  Surgeon: Lucas Mallow, MD;  [facility];  Service: Urology;  Laterality: N/A;   QUADRICEPS TENDON REPAIR  07/21/2011   Procedure: REPAIR QUADRICEP TENDON;  Surgeon: Arther Abbott, MD;  Location: [facility];  Service: Orthopedics;  Laterality: Right;   TOENAIL EXCISION     removed [id number]   UMBILICAL HERNIA REPAIR  07/17/2005   [place]   Family History:   Family History  Problem Relation Age of Onset   Hypertension Mother        MI   Cancer Mother         breast    Diabetes Mother    Diabetes Father    Hypertension Father    Hypertension Sister    Diabetes Sister    Arthritis Other    Asthma Other    Lung disease Other    Anesthesia problems Neg Hx    Hypotension Neg Hx    Malignant hyperthermia Neg Hx    Pseudochol deficiency Neg Hx    Colon cancer Neg Hx    Social History:  reports that he quit smoking about 11 years ago. His smoking use included cigarettes. He has a 25.00 pack-year smoking history. He has never used smokeless tobacco. He reports that he does not currently use alcohol. He reports current drug use. Drug: Marijuana. Allergies  Allergen Reactions   Opana [Oxymorphone Hcl] Itching   Oxymorphone Itching   Tramadol Itching   Prior to Admission medications   Medication Sig  Start Date End Date Taking? Authorizing Provider  albuterol (VENTOLIN HFA) 108 (90 Base) MCG/ACT inhaler Inhale 2 puffs into the lungs every 6 (six) hours as needed for wheezing or shortness of breath. 03/25/21  Yes Rigoberto Noel, MD  aspirin EC 81 MG tablet Take 81 mg by mouth daily. Swallow whole.   Yes [provider]  calcitRIOL (ROCALTROL) 0.25 MCG capsule Take 0.25 mcg by mouth daily.   Yes [provider]  insulin glargine (LANTUS SOLOSTAR) 100 UNIT/ML Solostar Pen Inject 22 Units into the skin at bedtime. 01/10/22  Yes Brita Romp, NP  isosorbide mononitrate (IMDUR) 60 MG 24 hr tablet Take 1 tablet (60 mg total) by mouth daily. 01/11/22  Yes Almyra Deforest, PA  losartan (COZAAR) 25 MG tablet Take 1 tablet (25 mg total) by mouth daily. 09/01/21 01/18/22 Yes Swinyer, Lanice Schwab, NP  nitroGLYCERIN (NITROSTAT) 0.4 MG SL tablet Place 1 tablet (0.4 mg total) under the tongue every 5 (five) minutes as needed for chest pain. 12/14/21  Yes Maudie Flakes, MD  pantoprazole (PROTONIX) 40 MG tablet Take 1 tablet (40 mg total) by mouth 2 (two) times daily before a meal. 07/25/21  Yes Erenest Rasher, PA-C  sevelamer (RENAGEL) 800 MG tablet Take  2,400 mg by mouth 3 (three) times daily with meals.   Yes [provider]  simvastatin (ZOCOR) 20 MG tablet Take 20 mg by mouth every morning.   Yes [provider]  ticagrelor (BRILINTA) 90 MG TABS tablet Take 1 tablet (90 mg total) by mouth 2 (two) times daily. 12/06/21  Yes Hilty, Nadean Corwin, MD  torsemide (DEMADEX) 100 MG tablet Take 100 mg by mouth daily. 05/03/21  Yes [provider]  TRADJENTA 5 MG TABS tablet TAKE 1 TABLET EVERY DAY 07/19/21  Yes Reardon, Juanetta Beets, NP  Vitamin D, Ergocalciferol, (DRISDOL) 1.25 MG (50000 UNIT) CAPS capsule Take 50,000 Units by mouth every 7 (seven) days. Monday 05/03/21  Yes [provider]  budesonide-formoterol (SYMBICORT) 160-4.5 MCG/ACT inhaler Inhale 2 puffs into the lungs 2 (two) times daily. Patient not taking: Reported on 01/18/2022 04/07/20   Collene Gobble, MD  glucose blood test strip 1 each by Other route 2 (two) times daily. Use as instructed to check blood glucose twice daily 02/22/21   Brita Romp, NP  Insulin Pen Needle (DROPLET PEN NEEDLES) 31G X 5 MM MISC Use to inject insulin daily as directed 12/30/20   Cassandria Anger, MD   Current Facility-Administered Medications  Medication Dose Route Frequency Provider Last Rate Last Admin   acetaminophen (TYLENOL) tablet 650 mg  650 mg Oral Q4H PRN Johnson, Clanford L, MD   650 mg at 01/18/22 2329   albuterol (VENTOLIN HFA) 108 (90 Base) MCG/ACT inhaler 2 puff  2 puff Inhalation Q6H PRN Johnson, Clanford L, MD       aspirin EC tablet 81 mg  81 mg Oral Daily Johnson, Clanford L, MD   81 mg at 01/19/22 0818   calcitRIOL (ROCALTROL) capsule 0.25 mcg  0.25 mcg Oral Daily Johnson, Clanford L, MD   0.25 mcg at 01/19/22 0818   heparin ADULT infusion 100 units/mL (25000 units/229m)  1,000 Units/hr Intravenous Continuous BLaren Everts RPH 10 mL/hr at 01/19/22 0523 1,000 Units/hr at 01/19/22 0523   insulin aspart (novoLOG) injection 0-6 Units  0-6 Units  Subcutaneous TID WC Johnson, Clanford L, MD       insulin aspart (novoLOG) injection 3 Units  3 Units Subcutaneous  TID WC Johnson, Clanford L, MD       insulin glargine-yfgn (SEMGLEE) injection 20 Units  20 Units Subcutaneous QHS Johnson, Clanford L, MD   20 Units at 01/18/22 2129   [START ON 01/20/2022] isosorbide mononitrate (IMDUR) 24 hr tablet 120 mg  120 mg Oral Daily Chandrasekhar, Mahesh A, MD       isosorbide mononitrate (IMDUR) 24 hr tablet 60 mg  60 mg Oral Once Chandrasekhar, Mahesh A, MD       linagliptin (TRADJENTA) tablet 5 mg  5 mg Oral Daily Johnson, Clanford L, MD   5 mg at 01/19/22 0817   losartan (COZAAR) tablet 25 mg  25 mg Oral Daily Johnson, Clanford L, MD   25 mg at 01/19/22 0817   nitroGLYCERIN (NITROSTAT) SL tablet 0.4 mg  0.4 mg Sublingual Q5 min PRN Wynetta Emery, Clanford L, MD   0.4 mg at 01/18/22 2330   ondansetron (ZOFRAN) injection 4 mg  4 mg Intravenous Q6H PRN Johnson, Clanford L, MD       pantoprazole (PROTONIX) EC tablet 40 mg  40 mg Oral BID AC Johnson, Clanford L, MD   40 mg at 01/19/22 0817   sevelamer carbonate (RENVELA) tablet 2,400 mg  2,400 mg Oral TID WC Johnson, Clanford L, MD   2,400 mg at 01/19/22 0818   simvastatin (ZOCOR) tablet 20 mg  20 mg Oral q morning Johnson, Clanford L, MD   20 mg at 01/19/22 0817   ticagrelor (BRILINTA) tablet 90 mg  90 mg Oral BID Johnson, Clanford L, MD   90 mg at 01/19/22 0817   torsemide (DEMADEX) tablet 100 mg  100 mg Oral Daily Johnson, Clanford L, MD   100 mg at 01/19/22 0817   Labs: Basic Metabolic Panel: Recent Labs  Lab 01/18/22 1149 01/18/22 1436  NA 135  --   K 3.9  --   CL 94*  --   CO2 29  --   GLUCOSE 116*  --   BUN 40*  --   CREATININE 17.26* 17.08*  CALCIUM 9.8  --    Liver Function Tests: Recent Labs  Lab 01/18/22 1149  AST 12*  ALT 13  ALKPHOS 75  BILITOT 0.4  PROT 7.2  ALBUMIN 3.2*   No results for input(s): "LIPASE", "AMYLASE" in the last 168 hours. No results for input(s): "AMMONIA" in  the last 168 hours. CBC: Recent Labs  Lab 01/18/22 1149 01/18/22 1436 01/19/22 0044  WBC 5.5 5.7 5.5  NEUTROABS 4.1  --   --   HGB 10.3* 9.8* 9.3*  HCT 32.1* 30.7* 29.4*  MCV 94.7 95.6 94.2  PLT 155 159 132*   Cardiac Enzymes: No results for input(s): "CKTOTAL", "CKMB", "CKMBINDEX", "TROPONINI" in the last 168 hours. CBG: Recent Labs  Lab 01/18/22 1714 01/18/22 2044 01/19/22 0312 01/19/22 0614  GLUCAP 118* 130* 101* 89   Iron Studies: No results for input(s): "IRON", "TIBC", "TRANSFERRIN", "FERRITIN" in the last 72 hours. Studies/Results: DG Chest Port 1 View  Result Date: 01/18/2022 CLINICAL DATA:  71 year old male presenting for evaluation of shortness of breath. EXAM: PORTABLE CHEST 1 VIEW COMPARISON:  None Available. FINDINGS: The heart size and mediastinal contours are within normal limits. Both lungs are clear. The visualized skeletal structures are unremarkable. IMPRESSION: No active disease. Electronically Signed   By: Zetta Bills M.D.   On: 01/18/2022 12:08    ROS: Pertinent items are noted in HPI. Physical Exam: Vitals:   01/18/22 2335 01/19/22 8299 01/19/22 3716 01/19/22 9678  BP: (!) 105/58  139/89 (!) 154/80  Pulse:   80 66  Resp: (!) '22  19 20  '$ Temp:   98 F (36.7 C) 97.8 F (36.6 C)  TempSrc:   Oral Oral  SpO2: 96%  94% 98%  Weight:  101.5 kg    Height:          Weight change:   Intake/Output Summary (Last 24 hours) at 01/19/2022 1031 Last data filed at 01/19/2022 0827 Gross per 24 hour  Intake 608.55 ml  Output 0 ml  Net 608.55 ml   BP (!) 154/80 (BP Location: Right Arm)   Pulse 66   Temp 97.8 F (36.6 C) (Oral)   Resp 20   Ht '5\' 4"'$  (1.626 m)   Wt 101.5 kg   SpO2 98%   BMI 38.42 kg/m  General appearance: alert, cooperative, and no distress Head: Normocephalic, without obvious abnormality, atraumatic Eyes: negative findings: lids and lashes normal, conjunctivae and sclerae normal, and corneas clear Resp: clear to auscultation  bilaterally Cardio: regular rate and rhythm, S1, S2 normal, no murmur, click, rub or gallop GI: obese, +BS, soft, NT/ND, PD catheter in LUQ without erythema or drainage Extremities: extremities normal, atraumatic, no cyanosis or edema Dialysis Access:  Dialysis Orders: [place] therapies.  CCPD, 5 exchanges 3 liter fill volumes, 10 min fill time, 1 hour 20 minutes dwell, 20 minute drain.  Last fill Icodextran.  EDW 103 kg. Uses combination of 1.5% and 2.5% depending upon BP and weight.  Assessment/Plan:  Chest pain - pt with complex CAD history.  He has had persistent CP   ESRD -  Will plan for CCPD tonight  Hypertension/volume  - below EDW.  Will use all 2.5% tonight since he did not get PD last night.  Anemia  - Will dose ESA while here.  Metabolic bone disease -  continue with home meds  Nutrition -  renal diet, carb modified.   Donetta Potts, MD [place] 01/19/2022, 10:31 AM

## 2022-01-19 NOTE — Plan of Care (Signed)
  Problem: Education: Goal: Understanding of CV disease, CV risk reduction, and recovery process will improve Outcome: Progressing   Problem: Activity: Goal: Risk for activity intolerance will decrease Outcome: Progressing   Problem: Pain Managment: Goal: General experience of comfort will improve Outcome: Progressing

## 2022-01-19 NOTE — Progress Notes (Signed)
ANTICOAGULATION CONSULT NOTE - Follow Up Consult  Pharmacy Consult for heparin Indication: chest pain/ACS  Labs: Recent Labs    01/18/22 1149 01/18/22 1349 01/18/22 1436 01/18/22 1807 01/18/22 2017 01/19/22 0044  HGB 10.3*  --  9.8*  --   --  9.3*  HCT 32.1*  --  30.7*  --   --  29.4*  PLT 155  --  159  --   --  132*  HEPARINUNFRC  --   --   --   --   --  0.27*  CREATININE 17.26*  --  17.08*  --   --   --   TROPONINIHS 448* 506*  --  541* 533*  --     Assessment: 70yo male subtherapeutic on heparin with initial dosing for CP; no infusion issues or signs of bleeding per RN.  Goal of Therapy:  Heparin level 0.3-0.7 units/ml   Plan:  Will increase heparin infusion slightly to 1000 units/hr and check level in 8 hours.   [name] [name] 01/19/2022,2:08 AM

## 2022-01-20 ENCOUNTER — Encounter (HOSPITAL_COMMUNITY)
Admission: EM | Disposition: A | Discharge status: Home / Self Care | Payer: Self-pay | Source: Home / Self Care | Attending: Internal Medicine

## 2022-01-20 DIAGNOSIS — I10 Essential (primary) hypertension: Secondary | ICD-10-CM | POA: Diagnosis not present

## 2022-01-20 DIAGNOSIS — E1122 Type 2 diabetes mellitus with diabetic chronic kidney disease: Secondary | ICD-10-CM | POA: Diagnosis not present

## 2022-01-20 DIAGNOSIS — I2510 Atherosclerotic heart disease of native coronary artery without angina pectoris: Secondary | ICD-10-CM

## 2022-01-20 DIAGNOSIS — I214 Non-ST elevation (NSTEMI) myocardial infarction: Secondary | ICD-10-CM

## 2022-01-20 DIAGNOSIS — I208 Other forms of angina pectoris: Secondary | ICD-10-CM | POA: Diagnosis not present

## 2022-01-20 DIAGNOSIS — I25118 Atherosclerotic heart disease of native coronary artery with other forms of angina pectoris: Secondary | ICD-10-CM | POA: Diagnosis not present

## 2022-01-20 DIAGNOSIS — N186 End stage renal disease: Secondary | ICD-10-CM | POA: Diagnosis not present

## 2022-01-20 HISTORY — PX: LEFT HEART CATH AND CORONARY ANGIOGRAPHY: CATH118249

## 2022-01-20 HISTORY — PX: CORONARY STENT INTERVENTION: CATH118234

## 2022-01-20 LAB — CBC
HCT: 27.9 % — ABNORMAL LOW (ref 39.0–52.0)
Hemoglobin: 9.1 g/dL — ABNORMAL LOW (ref 13.0–17.0)
MCH: 29.9 pg (ref 26.0–34.0)
MCHC: 32.6 g/dL (ref 30.0–36.0)
MCV: 91.8 fL (ref 80.0–100.0)
Platelets: 121 10*3/uL — ABNORMAL LOW (ref 150–400)
RBC: 3.04 MIL/uL — ABNORMAL LOW (ref 4.22–5.81)
RDW: 14.5 % (ref 11.5–15.5)
WBC: 4.9 10*3/uL (ref 4.0–10.5)
nRBC: 0 % (ref 0.0–0.2)

## 2022-01-20 LAB — GLUCOSE, CAPILLARY
Glucose-Capillary: 137 mg/dL — ABNORMAL HIGH (ref 70–99)
Glucose-Capillary: 153 mg/dL — ABNORMAL HIGH (ref 70–99)
Glucose-Capillary: 90 mg/dL (ref 70–99)
Glucose-Capillary: 91 mg/dL (ref 70–99)
Glucose-Capillary: 85 mg/dL (ref 70–99)

## 2022-01-20 LAB — POCT ACTIVATED CLOTTING TIME: Activated Clotting Time: 305 seconds

## 2022-01-20 LAB — BASIC METABOLIC PANEL
Anion gap: 14 (ref 5–15)
BUN: 51 mg/dL — ABNORMAL HIGH (ref 8–23)
CO2: 24 mmol/L (ref 22–32)
Calcium: 9.5 mg/dL (ref 8.9–10.3)
Chloride: 95 mmol/L — ABNORMAL LOW (ref 98–111)
Creatinine, Ser: 19.15 mg/dL — ABNORMAL HIGH (ref 0.61–1.24)
GFR, Estimated: 2 mL/min — ABNORMAL LOW (ref >60)
Glucose, Bld: 130 mg/dL — ABNORMAL HIGH (ref 70–99)
Potassium: 3.5 mmol/L (ref 3.5–5.1)
Sodium: 133 mmol/L — ABNORMAL LOW (ref 135–145)

## 2022-01-20 LAB — HEPARIN LEVEL (UNFRACTIONATED): Heparin Unfractionated: 0.4 IU/mL (ref 0.30–0.70)

## 2022-01-20 SURGERY — LEFT HEART CATH AND CORONARY ANGIOGRAPHY
Anesthesia: LOCAL

## 2022-01-20 MED ORDER — FENTANYL CITRATE (PF) 100 MCG/2ML IJ SOLN
INTRAMUSCULAR | Status: DC | PRN
  Administered 2022-01-20 (×2): 25 ug via INTRAVENOUS

## 2022-01-20 MED ORDER — LIDOCAINE HCL (PF) 1 % IJ SOLN
INTRAMUSCULAR | Status: DC | PRN
  Administered 2022-01-20: 15 mL

## 2022-01-20 MED ORDER — SODIUM CHLORIDE 0.9 % IV SOLN: INTRAVENOUS | Status: DC

## 2022-01-20 MED ORDER — HEPARIN (PORCINE) IN NACL 1000-0.9 UT/500ML-% IV SOLN
INTRAVENOUS | Status: AC
  Filled 2022-01-20: qty 1000

## 2022-01-20 MED ORDER — IOHEXOL 350 MG/ML SOLN
INTRAVENOUS | Status: DC | PRN
  Administered 2022-01-20: 115 mL

## 2022-01-20 MED ORDER — SODIUM CHLORIDE 0.9% FLUSH: 3.0000 mL | INTRAVENOUS | Status: DC | PRN

## 2022-01-20 MED ORDER — LIDOCAINE HCL (PF) 1 % IJ SOLN
INTRAMUSCULAR | Status: AC
  Filled 2022-01-20: qty 30

## 2022-01-20 MED ORDER — SODIUM CHLORIDE 0.9% FLUSH
3.0000 mL | Freq: Two times a day (BID) | INTRAVENOUS | Status: DC
  Administered 2022-01-20 – 2022-01-21 (×2): 3 mL via INTRAVENOUS

## 2022-01-20 MED ORDER — RANOLAZINE ER 500 MG PO TB12
500.0000 mg | ORAL_TABLET | Freq: Two times a day (BID) | ORAL | Status: DC
  Administered 2022-01-20 – 2022-01-21 (×3): 500 mg via ORAL
  Filled 2022-01-20 (×2): qty 1

## 2022-01-20 MED ORDER — MIDAZOLAM HCL 2 MG/2ML IJ SOLN
INTRAMUSCULAR | Status: AC
  Filled 2022-01-20: qty 2

## 2022-01-20 MED ORDER — LABETALOL HCL 5 MG/ML IV SOLN
10.0000 mg | INTRAVENOUS | Status: AC | PRN
Start: 2022-01-20 — End: 2022-01-21

## 2022-01-20 MED ORDER — SODIUM CHLORIDE 0.9 % IV SOLN: 250.0000 mL | INTRAVENOUS | Status: DC | PRN

## 2022-01-20 MED ORDER — FENTANYL CITRATE (PF) 100 MCG/2ML IJ SOLN
INTRAMUSCULAR | Status: AC
  Filled 2022-01-20: qty 2

## 2022-01-20 MED ORDER — HEPARIN (PORCINE) IN NACL 1000-0.9 UT/500ML-% IV SOLN
INTRAVENOUS | Status: DC | PRN
  Administered 2022-01-20 (×2): 500 mL

## 2022-01-20 MED ORDER — MIDAZOLAM HCL 2 MG/2ML IJ SOLN
INTRAMUSCULAR | Status: DC | PRN
  Administered 2022-01-20 (×2): 1 mg via INTRAVENOUS

## 2022-01-20 MED ORDER — HEPARIN SODIUM (PORCINE) 1000 UNIT/ML IJ SOLN
INTRAMUSCULAR | Status: DC | PRN
  Administered 2022-01-20: 10000 [IU] via INTRAVENOUS

## 2022-01-20 MED ORDER — HYDRALAZINE HCL 20 MG/ML IJ SOLN
10.0000 mg | INTRAMUSCULAR | Status: AC | PRN
Start: 2022-01-20 — End: 2022-01-21

## 2022-01-20 SURGICAL SUPPLY — 24 items
BALL SAPPHIRE NC24 4.5X10 (BALLOONS) ×2
BALLN SAPPHIRE 3.0X15 (BALLOONS) ×2
BALLOON SAPPHIRE 3.0X15 (BALLOONS) IMPLANT
BALLOON SAPPHIRE NC24 4.5X10 (BALLOONS) IMPLANT
CATH INFINITI 5FR MULTPACK ANG (CATHETERS) ×1 IMPLANT
CATH LAUNCHER 6FR AL1 (CATHETERS) IMPLANT
CATH VISTA GUIDE 6FR AL1 (CATHETERS) ×1 IMPLANT
CATHETER LAUNCHER 6FR AL1 (CATHETERS) ×2
CLOSURE PERCLOSE PROSTYLE (VASCULAR PRODUCTS) ×1 IMPLANT
GLIDESHEATH SLEND SS 6F .021 (SHEATH) IMPLANT
KIT ENCORE 26 ADVANTAGE (KITS) ×1 IMPLANT
KIT HEART LEFT (KITS) ×3 IMPLANT
KIT MICROPUNCTURE NIT STIFF (SHEATH) ×1 IMPLANT
PACK CARDIAC CATHETERIZATION (CUSTOM PROCEDURE TRAY) ×3 IMPLANT
SHEATH PINNACLE 5F 10CM (SHEATH) ×1 IMPLANT
SHEATH PINNACLE 6F 10CM (SHEATH) ×1 IMPLANT
SHEATH PROBE COVER 6X72 (BAG) ×1 IMPLANT
STENT SYNERGY XD 4.0X16 (Permanent Stent) IMPLANT
SYNERGY XD 4.0X16 (Permanent Stent) ×2 IMPLANT
TRANSDUCER W/STOPCOCK (MISCELLANEOUS) ×3 IMPLANT
TUBING CIL FLEX 10 FLL-RA (TUBING) ×3 IMPLANT
VALVE GUARDIAN II ~~LOC~~ HEMO (MISCELLANEOUS) ×1 IMPLANT
WIRE COUGAR XT STRL 190CM (WIRE) ×1 IMPLANT
WIRE EMERALD 3MM-J .035X150CM (WIRE) ×1 IMPLANT

## 2022-01-20 NOTE — Progress Notes (Signed)
[facility] PROGRESS NOTE   [name] [name] VOJ:[id number] DOB: [birth year] DOA: 01/18/2022  PCP: Carrolyn Meiers, MD  Brief History/Interval Summary: 71 year old gentleman with end-stage renal disease on nightly peritoneal dialysis, type 2 diabetes mellitus, history of polysubstance abuse including cocaine and marijuana, OSA, extensive coronary artery disease status post recent drug-eluting stent and angioplasty to the RCA had a cardiac catheterization 12/26/2021 which demonstrated severe single-vessel CAD with sequential 90-95% mid RCA stenosis at the site of angioplasty and previously placed RCA stent demonstrated mild to moderate in-stent restenosis in the distal third and patient subsequently had successful DES x2 to mid RCA.  He was also recently found to have an AV block.  His beta-blockers have been discontinued.  During his recent outpatient visit with cardiology his Imdur was increased from 30 mg to 60 mg.  Reports that he has continued to have chest pain since recent stents placed.  He was hospitalized for further management.  Consultants: Cardiology.  Nephrology  Procedures: Cardiac catheterization is planned for this afternoon.    Subjective/Interval History: Denies any chest pain currently.  No shortness of breath.  No nausea or vomiting.    Assessment/Plan:  Chest pain in the setting of known coronary artery disease Patient has had multiple interventions in the last 7 months.  Initial cardiac cath was in January and then subsequently in June.  See cardiology note for details. Troponins noted to be elevated.  Patient remains on heparin infusion.  He is on dual antiplatelet treatment with Brilinta and aspirin.  Statin. Seen by cardiology while he was at [facility].  He was transferred here due to peritoneal dialysis requirements. Plan is for cardiac catheterization this afternoon. Dose of Imdur was increased and Ranexa was added.  Echocardiogram shows  normal systolic function.  End-stage renal disease on peritoneal dialysis Nephrology is following.  Underwent peritoneal dialysis last night.  Obstructive sleep apnea On CPAP  Normocytic anemia Likely anemia of chronic kidney disease.  Hemoglobin is stable.  No evidence of overt bleeding.  Essential hypertension Stable.  Continue home medications.  Noted to be on losartan.  Pressure is reasonably well controlled.  Recent AV block Taken off of AV blocking agents.  Diabetes mellitus in the setting of end-stage renal disease Monitor CBGs.  Noted to be on glargine, Tradjenta.  On SSI.  CBGs are reasonably well controlled.  Morbid obesity Estimated body mass index is 38.11 kg/m as calculated from the following:   Height as of this encounter: '5\' 4"'$  (1.626 m).   Weight as of this encounter: 100.7 kg.   DVT Prophylaxis: On IV heparin Code Status: Full code Family Communication: Discussed with patient Disposition Plan: Hopefully return home when cleared by cardiology  Status is: Inpatient Remains inpatient appropriate because: Chest pain requiring cardiac evaluation.     Medications: Scheduled:  aspirin EC  81 mg Oral Daily   calcitRIOL  0.25 mcg Oral Daily   gentamicin cream  1 Application Topical Daily   insulin aspart  0-6 Units Subcutaneous TID WC   insulin aspart  3 Units Subcutaneous TID WC   insulin glargine-yfgn  20 Units Subcutaneous QHS   isosorbide mononitrate  120 mg Oral Daily   linagliptin  5 mg Oral Daily   losartan  25 mg Oral Daily   pantoprazole  40 mg Oral BID AC   ranolazine  500 mg Oral BID   sevelamer carbonate  2,400 mg Oral TID WC   simvastatin  20 mg Oral q  morning   sodium chloride flush  3 mL Intravenous Q12H   ticagrelor  90 mg Oral BID   torsemide  100 mg Oral Daily   Continuous:  sodium chloride     sodium chloride 10 mL/hr at 01/20/22 0457   dialysis solution 2.5% low-MG/low-CA     heparin 1,100 Units/hr (01/20/22 0457)   OVF:IEPPIR  chloride, acetaminophen, albuterol, nitroGLYCERIN, ondansetron (ZOFRAN) IV, sodium chloride flush  Antibiotics: Anti-infectives (From admission, onward)    None       Objective:  Vital Signs  Vitals:   01/19/22 1114 01/19/22 1900 01/20/22 0230 01/20/22 0314  BP: 127/64 (!) 106/55  119/63  Pulse: (!) 57 (!) 57  66  Resp: '20 20  20  '$ Temp: 97.7 F (36.5 C) 97.7 F (36.5 C)  (!) 97.5 F (36.4 C)  TempSrc: Oral Oral  Oral  SpO2: 96% 97%  93%  Weight:   100.7 kg   Height:        Intake/Output Summary (Last 24 hours) at 01/20/2022 1127 Last data filed at 01/20/2022 0457 Gross per 24 hour  Intake 909.94 ml  Output 0 ml  Net 909.94 ml    Filed Weights   01/18/22 1121 01/19/22 0314 01/20/22 0230  Weight: 101.6 kg 101.5 kg 100.7 kg    General appearance: Awake alert.  In no distress Resp: Clear to auscultation bilaterally.  Normal effort Cardio: S1-S2 is normal regular.  No S3-S4.  No rubs murmurs or bruit GI: Abdomen is soft.  Nontender nondistended.  Bowel sounds are present normal.  No masses organomegaly.  Peritoneal dialysis catheter is noted. Extremities: No edema.  Full range of motion of lower extremities. Neurologic: Alert and oriented x3.  No focal neurological deficits.    Lab Results:  Data Reviewed: I have personally reviewed following labs and reports of the imaging studies  CBC: Recent Labs  Lab 01/18/22 1149 01/18/22 1436 01/19/22 0044 01/20/22 0210  WBC 5.5 5.7 5.5 4.9  NEUTROABS 4.1  --   --   --   HGB 10.3* 9.8* 9.3* 9.1*  HCT 32.1* 30.7* 29.4* 27.9*  MCV 94.7 95.6 94.2 91.8  PLT 155 159 132* 121*     Basic Metabolic Panel: Recent Labs  Lab 01/18/22 1149 01/18/22 1436 01/20/22 0210  NA 135  --  133*  K 3.9  --  3.5  CL 94*  --  95*  CO2 29  --  24  GLUCOSE 116*  --  130*  BUN 40*  --  51*  CREATININE 17.26* 17.08* 19.15*  CALCIUM 9.8  --  9.5     GFR: Estimated Creatinine Clearance: 3.8 mL/min (A) (by C-G formula based on SCr  of 19.15 mg/dL (H)).  Liver Function Tests: Recent Labs  Lab 01/18/22 1149  AST 12*  ALT 13  ALKPHOS 75  BILITOT 0.4  PROT 7.2  ALBUMIN 3.2*       CBG: Recent Labs  Lab 01/19/22 1620 01/19/22 2047 01/20/22 0308 01/20/22 0606 01/20/22 1116  GLUCAP 84 106* 137* 153* 90     Radiology Studies: ECHOCARDIOGRAM LIMITED  Result Date: 01/19/2022    ECHOCARDIOGRAM LIMITED REPORT   Patient Name:   JEWELL RYANS Date of Exam: 01/19/2022 Medical Rec #:  [id number]      Height:       64.0 in Accession #:    [id number]     Weight:       223.8 lb Date of Birth:  05-Sep-1950  BSA:          2.052 m Patient Age:    89 years       BP:           127/64 mmHg Patient Gender: M              HR:           63 bpm. Exam Location:  Inpatient Procedure: Limited Echo, Limited Color Doppler and Cardiac Doppler Indications:    Chest Pain R07.9  History:        Patient has prior history of Echocardiogram examinations, most                 recent 12/27/2021. CAD, COPD; Risk Factors:Hypertension and                 Dyslipidemia. End stage renal disease                 1st degree HB.  Sonographer:    Ronny Flurry Sonographer#2:  Darlina Sicilian RDCS Referring Phys: [id number] [facility] A CHANDRASEKHAR IMPRESSIONS  1. Left ventricular ejection fraction, by estimation, is 65 to 70%. The left ventricle has normal function.  2. The mitral valve is grossly normal. No evidence of mitral valve regurgitation.  3. The aortic valve is calcified. There is mild calcification of the aortic valve. There is mild thickening of the aortic valve. Aortic valve regurgitation is not visualized. Aortic valve sclerosis is present, with no evidence of aortic valve stenosis. Aortic valve mean gradient measures 9.6 mmHg. Aortic valve Vmax measures 2.35 m/s. FINDINGS  Left Ventricle: Left ventricular ejection fraction, by estimation, is 65 to 70%. The left ventricle has normal function. Mitral Valve: The mitral valve is grossly normal. Mild mitral  annular calcification. Tricuspid Valve: The tricuspid valve is grossly normal. Tricuspid valve regurgitation is trivial. Aortic Valve: The aortic valve is calcified. There is mild calcification of the aortic valve. There is mild thickening of the aortic valve. Aortic valve regurgitation is not visualized. Aortic valve sclerosis is present, with no evidence of aortic valve stenosis. Aortic valve mean gradient measures 9.6 mmHg. Aortic valve peak gradient measures 22.1 mmHg. Aortic valve area, by VTI measures 1.17 cm. Aorta: The aortic root was not well visualized. LEFT VENTRICLE PLAX 2D LVOT diam:     1.60 cm LV SV:         48 LV SV Index:   24 LVOT Area:     2.01 cm  AORTIC VALVE AV Area (Vmax):    1.23 cm AV Area (Vmean):   1.61 cm AV Area (VTI):     1.17 cm AV Vmax:           234.83 cm/s AV Vmean:          135.199 cm/s AV VTI:            0.414 m AV Peak Grad:      22.1 mmHg AV Mean Grad:      9.6 mmHg LVOT Vmax:         144.00 cm/s LVOT Vmean:        108.000 cm/s LVOT VTI:          0.240 m LVOT/AV VTI ratio: 0.58  SHUNTS Systemic VTI:  0.24 m Systemic Diam: 1.60 cm Candee Furbish MD Electronically signed by Candee Furbish MD Signature Date/Time: 01/19/2022/2:22:29 PM    Final    DG Chest Port 1 View  Result Date: 01/18/2022 CLINICAL DATA:  71 year old male  presenting for evaluation of shortness of breath. EXAM: PORTABLE CHEST 1 VIEW COMPARISON:  None Available. FINDINGS: The heart size and mediastinal contours are within normal limits. Both lungs are clear. The visualized skeletal structures are unremarkable. IMPRESSION: No active disease. Electronically Signed   By: Zetta Bills M.D.   On: 01/18/2022 12:08       LOS: 1 day   [facility] Pager on [email or web address]  01/20/2022, 11:27 AM

## 2022-01-20 NOTE — Progress Notes (Addendum)
Patient ID: [name] [name], male   DOB: 1950-10-07, 71 y.o.   MRN: [id number] S: [place] [employer] last night. O:BP 119/63 (BP Location: Right Wrist)   Pulse 66   Temp (!) 97.5 F (36.4 C) (Oral)   Resp 20   Ht '5\' 4"'$  (1.626 m)   Wt 100.7 kg   SpO2 93%   BMI 38.11 kg/m   Intake/Output Summary (Last 24 hours) at 01/20/2022 0947 Last data filed at 01/20/2022 0457 Gross per 24 hour  Intake 909.94 ml  Output 0 ml  Net 909.94 ml   Intake/Output: I/O last 3 completed shifts: In: 1518.5 [P.O.:960; I.V.:558.5] Out: 0   Intake/Output this shift:  No intake/output data recorded. Weight change: -0.907 kg Gen: NAD CVS: RRR Resp:CTA Abd: obese, +BS, soft, nontender Ext: no edema  Recent Labs  Lab 01/18/22 1149 01/18/22 1436 01/20/22 0210  NA 135  --  133*  K 3.9  --  3.5  CL 94*  --  95*  CO2 29  --  24  GLUCOSE 116*  --  130*  BUN 40*  --  51*  CREATININE 17.26* 17.08* 19.15*  ALBUMIN 3.2*  --   --   CALCIUM 9.8  --  9.5  AST 12*  --   --   ALT 13  --   --    Liver Function Tests: Recent Labs  Lab 01/18/22 1149  AST 12*  ALT 13  ALKPHOS 75  BILITOT 0.4  PROT 7.2  ALBUMIN 3.2*   No results for input(s): "LIPASE", "AMYLASE" in the last 168 hours. No results for input(s): "AMMONIA" in the last 168 hours. CBC: Recent Labs  Lab 01/18/22 1149 01/18/22 1436 01/19/22 0044 01/20/22 0210  WBC 5.5 5.7 5.5 4.9  NEUTROABS 4.1  --   --   --   HGB 10.3* 9.8* 9.3* 9.1*  HCT 32.1* 30.7* 29.4* 27.9*  MCV 94.7 95.6 94.2 91.8  PLT 155 159 132* 121*   Cardiac Enzymes: No results for input(s): "CKTOTAL", "CKMB", "CKMBINDEX", "TROPONINI" in the last 168 hours. CBG: Recent Labs  Lab 01/19/22 1109 01/19/22 1620 01/19/22 2047 01/20/22 0308 01/20/22 0606  GLUCAP 115* 84 106* 137* 153*    Iron Studies: No results for input(s): "IRON", "TIBC", "TRANSFERRIN", "FERRITIN" in the last 72 hours. Studies/Results: ECHOCARDIOGRAM LIMITED  Result Date: 01/19/2022    ECHOCARDIOGRAM  LIMITED REPORT   Patient Name:   [name] [name] Date of Exam: 01/19/2022 Medical Rec #:  [id number]      Height:       64.0 in Accession #:    [id number]     Weight:       223.8 lb Date of Birth:  1950/08/15     BSA:          2.052 m Patient Age:    21 years       BP:           127/64 mmHg Patient Gender: M              HR:           63 bpm. Exam Location:  Inpatient Procedure: Limited Echo, Limited Color Doppler and Cardiac Doppler Indications:    Chest Pain R07.9  History:        Patient has prior history of Echocardiogram examinations, most                 recent 12/27/2021. CAD, COPD; Risk Factors:Hypertension and  Dyslipidemia. End stage renal disease                 1st degree HB.  Sonographer:    Ronny Flurry Sonographer#2:  Darlina Sicilian RDCS Referring Phys: [id number] [facility] A CHANDRASEKHAR IMPRESSIONS  1. Left ventricular ejection fraction, by estimation, is 65 to 70%. The left ventricle has normal function.  2. The mitral valve is grossly normal. No evidence of mitral valve regurgitation.  3. The aortic valve is calcified. There is mild calcification of the aortic valve. There is mild thickening of the aortic valve. Aortic valve regurgitation is not visualized. Aortic valve sclerosis is present, with no evidence of aortic valve stenosis. Aortic valve mean gradient measures 9.6 mmHg. Aortic valve Vmax measures 2.35 m/s. FINDINGS  Left Ventricle: Left ventricular ejection fraction, by estimation, is 65 to 70%. The left ventricle has normal function. Mitral Valve: The mitral valve is grossly normal. Mild mitral annular calcification. Tricuspid Valve: The tricuspid valve is grossly normal. Tricuspid valve regurgitation is trivial. Aortic Valve: The aortic valve is calcified. There is mild calcification of the aortic valve. There is mild thickening of the aortic valve. Aortic valve regurgitation is not visualized. Aortic valve sclerosis is present, with no evidence of aortic valve stenosis.  Aortic valve mean gradient measures 9.6 mmHg. Aortic valve peak gradient measures 22.1 mmHg. Aortic valve area, by VTI measures 1.17 cm. Aorta: The aortic root was not well visualized. LEFT VENTRICLE PLAX 2D LVOT diam:     1.60 cm LV SV:         48 LV SV Index:   24 LVOT Area:     2.01 cm  AORTIC VALVE AV Area (Vmax):    1.23 cm AV Area (Vmean):   1.61 cm AV Area (VTI):     1.17 cm AV Vmax:           234.83 cm/s AV Vmean:          135.199 cm/s AV VTI:            0.414 m AV Peak Grad:      22.1 mmHg AV Mean Grad:      9.6 mmHg LVOT Vmax:         144.00 cm/s LVOT Vmean:        108.000 cm/s LVOT VTI:          0.240 m LVOT/AV VTI ratio: 0.58  SHUNTS Systemic VTI:  0.24 m Systemic Diam: 1.60 cm Candee Furbish MD Electronically signed by Candee Furbish MD Signature Date/Time: 01/19/2022/2:22:29 PM    Final    DG Chest Port 1 View  Result Date: 01/18/2022 CLINICAL DATA:  71 year old male presenting for evaluation of shortness of breath. EXAM: PORTABLE CHEST 1 VIEW COMPARISON:  None Available. FINDINGS: The heart size and mediastinal contours are within normal limits. Both lungs are clear. The visualized skeletal structures are unremarkable. IMPRESSION: No active disease. Electronically Signed   By: Zetta Bills M.D.   On: 01/18/2022 12:08    aspirin EC  81 mg Oral Daily   calcitRIOL  0.25 mcg Oral Daily   gentamicin cream  1 Application Topical Daily   insulin aspart  0-6 Units Subcutaneous TID WC   insulin aspart  3 Units Subcutaneous TID WC   insulin glargine-yfgn  20 Units Subcutaneous QHS   isosorbide mononitrate  120 mg Oral Daily   linagliptin  5 mg Oral Daily   losartan  25 mg Oral Daily   pantoprazole  40 mg Oral  BID AC   ranolazine  1,000 mg Oral BID   sevelamer carbonate  2,400 mg Oral TID WC   simvastatin  20 mg Oral q morning   sodium chloride flush  3 mL Intravenous Q12H   ticagrelor  90 mg Oral BID   torsemide  100 mg Oral Daily    BMET    Component Value Date/Time   NA 133 (L)  01/20/2022 0210   NA 136 12/22/2021 1017   K 3.5 01/20/2022 0210   CL 95 (L) 01/20/2022 0210   CO2 24 01/20/2022 0210   GLUCOSE 130 (H) 01/20/2022 0210   BUN 51 (H) 01/20/2022 0210   BUN 58 (H) 12/22/2021 1017   CREATININE 19.15 (H) 01/20/2022 0210   CREATININE 18.89 (H) 02/09/2020 1126   CALCIUM 9.5 01/20/2022 0210   GFRNONAA 2 (L) 01/20/2022 0210   GFRNONAA 2 (L) 02/09/2020 1126   GFRAA <4 (L) 02/09/2020 1126   CBC    Component Value Date/Time   WBC 4.9 01/20/2022 0210   RBC 3.04 (L) 01/20/2022 0210   HGB 9.1 (L) 01/20/2022 0210   HGB 11.3 (L) 12/22/2021 1017   HCT 27.9 (L) 01/20/2022 0210   HCT 33.7 (L) 12/22/2021 1017   PLT 121 (L) 01/20/2022 0210   PLT 117 (L) 12/22/2021 1017   MCV 91.8 01/20/2022 0210   MCV 91 12/22/2021 1017   MCH 29.9 01/20/2022 0210   MCHC 32.6 01/20/2022 0210   RDW 14.5 01/20/2022 0210   RDW 13.7 12/22/2021 1017   LYMPHSABS 0.6 (L) 01/18/2022 1149   MONOABS 0.6 01/18/2022 1149   EOSABS 0.2 01/18/2022 1149   BASOSABS 0.1 01/18/2022 1149    Dialysis Orders: [facility].  CCPD, 5 exchanges 3 liter fill volumes, 10 min fill time, 1 hour 20 minutes dwell, 20 minute drain.  Last fill Icodextran.  EDW 103 kg. Uses combination of 1.5% and 2.5% depending upon BP and weight.   Assessment/Plan:  Chest pain - pt with complex CAD history.  He has had persistent CP   ESRD -  continue CCPD nightly   Hypertension/volume  - below EDW.  Will use all 2.5% again tonight pending cardiac cath results.  Anemia  - Will dose ESA while here.  Metabolic bone disease -  continue with home meds  Nutrition -  renal diet, carb modified.   Donetta Potts, MD [facility]

## 2022-01-20 NOTE — Interval H&P Note (Signed)
History and Physical Interval Note:  01/20/2022 6:04 PM  [name] [name]  has presented today for surgery, with the diagnosis of cad.  The various methods of treatment have been discussed with the patient and family. After consideration of risks, benefits and other options for treatment, the patient has consented to  Procedure(s): LEFT HEART CATH AND CORONARY ANGIOGRAPHY (N/A) as a surgical intervention.  The patient's history has been reviewed, patient examined, no change in status, stable for surgery.  I have reviewed the patient's chart and labs.  Questions were answered to the patient's satisfaction.     Sherren Mocha

## 2022-01-20 NOTE — TOC Initial Note (Signed)
Transition of Care [facility]) - Initial/Assessment Note    Patient Details  Name: [name] [name] MRN: [id number] Date of Birth: 11/27/1950  [facility]) CM/SW Contact:    Zenon Mayo, RN Phone Number: 01/20/2022, 3:21 PM  Clinical Narrative:                 From home with wife, indep, presents with chest pain, for cath today,   He has PCP, he goes to [facility] in [place] on [address]  His wife will transport him at [employer].    Expected Discharge Plan: Home/Self Care Barriers to Discharge: Continued Medical Work up   Patient Goals and CMS Choice Patient states their goals for this hospitalization and ongoing recovery are:: return home   Choice offered to / list presented to : NA  Expected Discharge Plan and Services Expected Discharge Plan: Home/Self Care In-house Referral: NA Discharge Planning Services: CM Consult Post Acute Care Choice: NA                             HH Arranged: NA          Prior Living Arrangements/Services   Lives with:: Spouse Patient language and need for interpreter reviewed:: Yes Do you feel safe going back to the place where you live?: Yes      Need for Family Participation in Patient Care: Yes (Comment) Care giver support system in place?: Yes (comment)   Criminal Activity/Legal Involvement Pertinent to Current Situation/Hospitalization: No - Comment as needed  Activities of Daily Living      Permission Sought/Granted                  Emotional Assessment Appearance:: Appears stated age Attitude/Demeanor/Rapport: Engaged Affect (typically observed): Appropriate Orientation: : Oriented to Self, Oriented to Place, Oriented to  Time, Oriented to Situation Alcohol / Substance Use: Not Applicable Psych Involvement: No (comment)  Admission diagnosis:  NSTEMI (non-ST elevated myocardial infarction) ([place]) [I21.4] Chest pain [R07.9] Patient Active Problem List   Diagnosis Date Noted   Second degree  AV block, Mobitz type I 12/27/2021   Unstable angina (HCC)    NSTEMI (non-ST elevated myocardial infarction) ([place])    Rectal bleeding 07/13/2021   Allergic rhinitis 04/07/2020   Asthma 10/02/2019   Chest pain 08/29/2018   Constipation 02/28/2018   End stage renal disease ([place]) 11/13/2017   Foul smelling urine 12/14/2016   Coronary artery disease involving native coronary artery of native heart without angina pectoris 10/27/2016   Chronic kidney disease, stage IV (severe) ([place]) 10/27/2016   Morbid obesity due to excess calories ([place]) 03/22/2016   CAD (coronary artery disease)    Erectile dysfunction 08/16/2015   Hemorrhoids 07/05/2015   Vitamin D deficiency 06/04/2015   OSA on CPAP 05/14/2015   Normocytic anemia 03/24/2015   Fatty liver 12/01/2014   Back pain 06/05/2013   OA (osteoarthritis) of knee 03/15/2011   CTS (carpal tunnel syndrome) 03/15/2011   Diabetes mellitus with stage 5 chronic kidney disease ([place]) 09/29/2008   Hyperlipidemia 09/29/2008   Essential hypertension 09/29/2008   GERD 09/29/2008   PCP:  Carrolyn Meiers, MD Pharmacy:   [place], [place]. [place] [address] Phone: [phone] Fax: [phone]  [facility] Delivery - [place], [address]  [place] [address] Phone: [phone] Fax: [phone]  [place], [place] - [address] [place] [address] Phone: [phone] Fax: [phone]     Social Determinants of Health (SDOH) Interventions    Readmission Risk Interventions    01/20/2022    3:18 PM  Readmission Risk Prevention Plan  Transportation Screening Complete  PCP or Specialist Appt within 3-5 Days Complete  HRI or [place] Complete  Social Work Consult for [place] Planning/Counseling Complete  Palliative Care  Screening Not Applicable  Medication Review Press photographer) Complete

## 2022-01-20 NOTE — Progress Notes (Signed)
Right Femoral site started bleeding. Held pressure for 20 minutes. Changed dressing. Site is currently level 0 hematoma, and no obvious sign of bleeding. Will continue to monitor.

## 2022-01-20 NOTE — Progress Notes (Signed)
ANTICOAGULATION CONSULT NOTE - Follow Up Consult  Pharmacy Consult for Heparin Indication: chest pain/ACS  Allergies  Allergen Reactions   Opana [Oxymorphone Hcl] Itching   Oxymorphone Itching   Tramadol Itching    Patient Measurements: Height: '5\' 4"'$  (162.6 cm) Weight: 100.7 kg (222 lb) IBW/kg (Calculated) : 59.2 Heparin Dosing Weight: 82.3 kg  Vital Signs: Temp: 97.5 F (36.4 C) (07/07 0314) Temp Source: Oral (07/07 0314) BP: 119/63 (07/07 0314) Pulse Rate: 66 (07/07 0314)  Labs: Recent Labs    01/18/22 1149 01/18/22 1349 01/18/22 1436 01/18/22 1807 01/18/22 2017 01/19/22 0044 01/19/22 1013 01/20/22 0210  HGB 10.3*  --  9.8*  --   --  9.3*  --  9.1*  HCT 32.1*  --  30.7*  --   --  29.4*  --  27.9*  PLT 155  --  159  --   --  132*  --  121*  HEPARINUNFRC  --   --   --   --   --  0.27* 0.36 0.40  CREATININE 17.26*  --  17.08*  --   --   --   --  19.15*  TROPONINIHS 448* 506*  --  541* 533*  --   --   --      Estimated Creatinine Clearance: 3.8 mL/min (A) (by C-G formula based on SCr of 19.15 mg/dL (H)).   Medications:  Scheduled:   aspirin EC  81 mg Oral Daily   calcitRIOL  0.25 mcg Oral Daily   gentamicin cream  1 Application Topical Daily   insulin aspart  0-6 Units Subcutaneous TID WC   insulin aspart  3 Units Subcutaneous TID WC   insulin glargine-yfgn  20 Units Subcutaneous QHS   isosorbide mononitrate  120 mg Oral Daily   linagliptin  5 mg Oral Daily   losartan  25 mg Oral Daily   pantoprazole  40 mg Oral BID AC   ranolazine  1,000 mg Oral BID   sevelamer carbonate  2,400 mg Oral TID WC   simvastatin  20 mg Oral q morning   sodium chloride flush  3 mL Intravenous Q12H   ticagrelor  90 mg Oral BID   torsemide  100 mg Oral Daily   Infusions:   sodium chloride     sodium chloride 10 mL/hr at 01/20/22 0457   dialysis solution 2.5% low-MG/low-CA     heparin 1,100 Units/hr (01/20/22 0457)    Assessment: 71 yo M with CAD s/p recent cath with  stent placement.  Noted recurrence of chest pain / pressure and presented to [facility] ED.  Patient was started on heparin infusion and transferred to [facility] for management. Heparin level is at goal on 1100 units/hr.    Noted plans for aggressive med mgmt and if no improvement possible cardiac cath 01/20/22.  Goal of Therapy:  Heparin level 0.3-0.7 units/ml Monitor platelets by anticoagulation protocol: Yes   Plan:  Continue heparin at 1100 units/hr. Daily heparin level and CBC while on heparin. Follow-up plans for cardiac cath.  [employer], Pharm.D., BCPS Clinical Pharmacist Clinical phone for 01/20/2022 from 7:30-3:00 is [phone].  **Pharmacist phone directory can be found on [email or web address] listed under [place].  01/20/2022 8:33 AM

## 2022-01-20 NOTE — Progress Notes (Signed)
Pt refuses CPAP 

## 2022-01-20 NOTE — H&P (View-Only) (Signed)
Progress Note  Patient Name: [name] [name] Date of Encounter: 01/20/2022  Primary Cardiologist: Pixie Casino, MD   Subjective   Has had persistent chest pain despite maximal GDMT. This AM is feeling ok.  He is starting to think this may not be his heart, but if its not his heart he is not sure what it is.  Inpatient Medications    Scheduled Meds:  aspirin EC  81 mg Oral Daily   calcitRIOL  0.25 mcg Oral Daily   gentamicin cream  1 Application Topical Daily   insulin aspart  0-6 Units Subcutaneous TID WC   insulin aspart  3 Units Subcutaneous TID WC   insulin glargine-yfgn  20 Units Subcutaneous QHS   isosorbide mononitrate  120 mg Oral Daily   linagliptin  5 mg Oral Daily   losartan  25 mg Oral Daily   pantoprazole  40 mg Oral BID AC   ranolazine  500 mg Oral BID   sevelamer carbonate  2,400 mg Oral TID WC   simvastatin  20 mg Oral q morning   sodium chloride flush  3 mL Intravenous Q12H   ticagrelor  90 mg Oral BID   torsemide  100 mg Oral Daily   Continuous Infusions:  sodium chloride     sodium chloride 10 mL/hr at 01/20/22 0457   dialysis solution 2.5% low-MG/low-CA     heparin 1,100 Units/hr (01/20/22 0457)   PRN Meds: sodium chloride, acetaminophen, albuterol, nitroGLYCERIN, ondansetron (ZOFRAN) IV, sodium chloride flush   Vital Signs    Vitals:   01/19/22 1114 01/19/22 1900 01/20/22 0230 01/20/22 0314  BP: 127/64 (!) 106/55  119/63  Pulse: (!) 57 (!) 57  66  Resp: '20 20  20  '$ Temp: 97.7 F (36.5 C) 97.7 F (36.5 C)  (!) 97.5 F (36.4 C)  TempSrc: Oral Oral  Oral  SpO2: 96% 97%  93%  Weight:   100.7 kg   Height:        Intake/Output Summary (Last 24 hours) at 01/20/2022 1030 Last data filed at 01/20/2022 0457 Gross per 24 hour  Intake 909.94 ml  Output 0 ml  Net 909.94 ml   Filed Weights   01/18/22 1121 01/19/22 0314 01/20/22 0230  Weight: 101.6 kg 101.5 kg 100.7 kg    Telemetry    SB with 1st HB and occasional Mobtiz HB; rate 40 at  lowest - Personally Reviewed  Physical Exam   Gen: no distress, morbid obesity Neck: No JVD Cardiac: No Rubs or Gallops, systolic murmur regular bradycardia  +2 radial pulses Respiratory: Clear to auscultation bilaterally, normal effort, normal  respiratory rate GI: Soft, nontender, non-distended  MS: No  edema;  moves all extremities Integument: Skin feels warm Neuro:  At time of evaluation, alert and oriented to person/place/time/situation  Psych: Normal affect, patient feels ok  Labs    Chemistry Recent Labs  Lab 01/18/22 1149 01/18/22 1436 01/20/22 0210  NA 135  --  133*  K 3.9  --  3.5  CL 94*  --  95*  CO2 29  --  24  GLUCOSE 116*  --  130*  BUN 40*  --  51*  CREATININE 17.26* 17.08* 19.15*  CALCIUM 9.8  --  9.5  PROT 7.2  --   --   ALBUMIN 3.2*  --   --   AST 12*  --   --   ALT 13  --   --   ALKPHOS 75  --   --  BILITOT 0.4  --   --   GFRNONAA 3* 3* 2*  ANIONGAP 12  --  14     Hematology Recent Labs  Lab 01/18/22 1436 01/19/22 0044 01/20/22 0210  WBC 5.7 5.5 4.9  RBC 3.21* 3.12* 3.04*  HGB 9.8* 9.3* 9.1*  HCT 30.7* 29.4* 27.9*  MCV 95.6 94.2 91.8  MCH 30.5 29.8 29.9  MCHC 31.9 31.6 32.6  RDW 14.6 14.5 14.5  PLT 159 132* 121*    Cardiac EnzymesNo results for input(s): "TROPONINI" in the last 168 hours. No results for input(s): "TROPIPOC" in the last 168 hours.   BNPNo results for input(s): "BNP", "PROBNP" in the last 168 hours.   DDimer No results for input(s): "DDIMER" in the last 168 hours.   Radiology    ECHOCARDIOGRAM LIMITED  Result Date: 01/19/2022    ECHOCARDIOGRAM LIMITED REPORT   Patient Name:   [name] [name] Date of Exam: 01/19/2022 Medical Rec #:  [id number]      Height:       64.0 in Accession #:    [id number]     Weight:       223.8 lb Date of Birth:  1951/01/29     BSA:          2.052 m Patient Age:    71 years       BP:           127/64 mmHg Patient Gender: M              HR:           63 bpm. Exam Location:  Inpatient Procedure:  Limited Echo, Limited Color Doppler and Cardiac Doppler Indications:    Chest Pain R07.9  History:        Patient has prior history of Echocardiogram examinations, most                 recent 12/27/2021. CAD, COPD; Risk Factors:Hypertension and                 Dyslipidemia. End stage renal disease                 1st degree HB.  Sonographer:    Ronny Flurry Sonographer#2:  Darlina Sicilian RDCS Referring Phys: [id number] [facility] Catalia Massett IMPRESSIONS  1. Left ventricular ejection fraction, by estimation, is 65 to 70%. The left ventricle has normal function.  2. The mitral valve is grossly normal. No evidence of mitral valve regurgitation.  3. The aortic valve is calcified. There is mild calcification of the aortic valve. There is mild thickening of the aortic valve. Aortic valve regurgitation is not visualized. Aortic valve sclerosis is present, with no evidence of aortic valve stenosis. Aortic valve mean gradient measures 9.6 mmHg. Aortic valve Vmax measures 2.35 m/s. FINDINGS  Left Ventricle: Left ventricular ejection fraction, by estimation, is 65 to 70%. The left ventricle has normal function. Mitral Valve: The mitral valve is grossly normal. Mild mitral annular calcification. Tricuspid Valve: The tricuspid valve is grossly normal. Tricuspid valve regurgitation is trivial. Aortic Valve: The aortic valve is calcified. There is mild calcification of the aortic valve. There is mild thickening of the aortic valve. Aortic valve regurgitation is not visualized. Aortic valve sclerosis is present, with no evidence of aortic valve stenosis. Aortic valve mean gradient measures 9.6 mmHg. Aortic valve peak gradient measures 22.1 mmHg. Aortic valve area, by VTI measures 1.17 cm. Aorta: The aortic root was not well visualized. LEFT VENTRICLE PLAX  2D LVOT diam:     1.60 cm LV SV:         48 LV SV Index:   24 LVOT Area:     2.01 cm  AORTIC VALVE AV Area (Vmax):    1.23 cm AV Area (Vmean):   1.61 cm AV Area (VTI):      1.17 cm AV Vmax:           234.83 cm/s AV Vmean:          135.199 cm/s AV VTI:            0.414 m AV Peak Grad:      22.1 mmHg AV Mean Grad:      9.6 mmHg LVOT Vmax:         144.00 cm/s LVOT Vmean:        108.000 cm/s LVOT VTI:          0.240 m LVOT/AV VTI ratio: 0.58  SHUNTS Systemic VTI:  0.24 m Systemic Diam: 1.60 cm Candee Furbish MD Electronically signed by Candee Furbish MD Signature Date/Time: 01/19/2022/2:22:29 PM    Final    DG Chest Port 1 View  Result Date: 01/18/2022 CLINICAL DATA:  71 year old male presenting for evaluation of shortness of breath. EXAM: PORTABLE CHEST 1 VIEW COMPARISON:  None Available. FINDINGS: The heart size and mediastinal contours are within normal limits. Both lungs are clear. The visualized skeletal structures are unremarkable. IMPRESSION: No active disease. Electronically Signed   By: Zetta Bills M.D.   On: 01/18/2022 12:08     Patient Profile     71 y.o. male hx of CAD (s/p DES to proximal RCA with PTCA of mid and distal RCA in 07/2021, cath in 12/2021 with DESx2 to mid RCA stenoses with residual 80% PDA stenosis), HTN, HLD, OSA and ESRD (on PD) who is being seen 01/18/2022 for the evaluation of chest pain and elevated troponin values  Assessment & Plan    Chest pain in the setting of CAD (LHC twice this year with residual PDA stenosis) Peptic ulcer disease Mobtiz Type I HB and bradycardia not on BB for this reason HTN and HLD ESRD on PD Mild- Mod AS, Mild AI - no WMA on Echo - continue ASA and Brilinta - continue statin  - continue IMDUR 120 mg and Ranexa (max dose while on PD) - PPI for his PUD - I worry this is not cardiac etiology, but given his PDA disease, elevate troponin (in the setting of ESRD), and him being on maximal medical tolerated therapy, will proceed with LHC - if no intervention will likely be able to sign off this PM; will around outpatient cardiology follow up  For questions or updates, please contact [facility] Please  consult [email or web address] for contact info under Cardiology/STEMI.      Rudean Haskell, MD [place], [address] [place], [place] [address] [phone]  10:30 AM

## 2022-01-20 NOTE — Progress Notes (Signed)
Progress Note  Patient Name: [name] [name] Date of Encounter: 01/20/2022  Primary Cardiologist: Pixie Casino, MD   Subjective   Has had persistent chest pain despite maximal GDMT. This AM is feeling ok.  He is starting to think this may not be his heart, but if its not his heart he is not sure what it is.  Inpatient Medications    Scheduled Meds:  aspirin EC  81 mg Oral Daily   calcitRIOL  0.25 mcg Oral Daily   gentamicin cream  1 Application Topical Daily   insulin aspart  0-6 Units Subcutaneous TID WC   insulin aspart  3 Units Subcutaneous TID WC   insulin glargine-yfgn  20 Units Subcutaneous QHS   isosorbide mononitrate  120 mg Oral Daily   linagliptin  5 mg Oral Daily   losartan  25 mg Oral Daily   pantoprazole  40 mg Oral BID AC   ranolazine  500 mg Oral BID   sevelamer carbonate  2,400 mg Oral TID WC   simvastatin  20 mg Oral q morning   sodium chloride flush  3 mL Intravenous Q12H   ticagrelor  90 mg Oral BID   torsemide  100 mg Oral Daily   Continuous Infusions:  sodium chloride     sodium chloride 10 mL/hr at 01/20/22 0457   dialysis solution 2.5% low-MG/low-CA     heparin 1,100 Units/hr (01/20/22 0457)   PRN Meds: sodium chloride, acetaminophen, albuterol, nitroGLYCERIN, ondansetron (ZOFRAN) IV, sodium chloride flush   Vital Signs    Vitals:   01/19/22 1114 01/19/22 1900 01/20/22 0230 01/20/22 0314  BP: 127/64 (!) 106/55  119/63  Pulse: (!) 57 (!) 57  66  Resp: '20 20  20  '$ Temp: 97.7 F (36.5 C) 97.7 F (36.5 C)  (!) 97.5 F (36.4 C)  TempSrc: Oral Oral  Oral  SpO2: 96% 97%  93%  Weight:   100.7 kg   Height:        Intake/Output Summary (Last 24 hours) at 01/20/2022 1030 Last data filed at 01/20/2022 0457 Gross per 24 hour  Intake 909.94 ml  Output 0 ml  Net 909.94 ml   Filed Weights   01/18/22 1121 01/19/22 0314 01/20/22 0230  Weight: 101.6 kg 101.5 kg 100.7 kg    Telemetry    SB with 1st HB and occasional Mobtiz HB; rate 40 at  lowest - Personally Reviewed  Physical Exam   Gen: no distress, morbid obesity Neck: No JVD Cardiac: No Rubs or Gallops, systolic murmur regular bradycardia  +2 radial pulses Respiratory: Clear to auscultation bilaterally, normal effort, normal  respiratory rate GI: Soft, nontender, non-distended  MS: No  edema;  moves all extremities Integument: Skin feels warm Neuro:  At time of evaluation, alert and oriented to person/place/time/situation  Psych: Normal affect, patient feels ok  Labs    Chemistry Recent Labs  Lab 01/18/22 1149 01/18/22 1436 01/20/22 0210  NA 135  --  133*  K 3.9  --  3.5  CL 94*  --  95*  CO2 29  --  24  GLUCOSE 116*  --  130*  BUN 40*  --  51*  CREATININE 17.26* 17.08* 19.15*  CALCIUM 9.8  --  9.5  PROT 7.2  --   --   ALBUMIN 3.2*  --   --   AST 12*  --   --   ALT 13  --   --   ALKPHOS 75  --   --  BILITOT 0.4  --   --   GFRNONAA 3* 3* 2*  ANIONGAP 12  --  14     Hematology Recent Labs  Lab 01/18/22 1436 01/19/22 0044 01/20/22 0210  WBC 5.7 5.5 4.9  RBC 3.21* 3.12* 3.04*  HGB 9.8* 9.3* 9.1*  HCT 30.7* 29.4* 27.9*  MCV 95.6 94.2 91.8  MCH 30.5 29.8 29.9  MCHC 31.9 31.6 32.6  RDW 14.6 14.5 14.5  PLT 159 132* 121*    Cardiac EnzymesNo results for input(s): "TROPONINI" in the last 168 hours. No results for input(s): "TROPIPOC" in the last 168 hours.   BNPNo results for input(s): "BNP", "PROBNP" in the last 168 hours.   DDimer No results for input(s): "DDIMER" in the last 168 hours.   Radiology    ECHOCARDIOGRAM LIMITED  Result Date: 01/19/2022    ECHOCARDIOGRAM LIMITED REPORT   Patient Name:   STEFANO TRULSON Date of Exam: 01/19/2022 Medical Rec #:  [id number]      Height:       64.0 in Accession #:    [id number]     Weight:       223.8 lb Date of Birth:  12/22/1950     BSA:          2.052 m Patient Age:    71 years       BP:           127/64 mmHg Patient Gender: M              HR:           63 bpm. Exam Location:  Inpatient Procedure:  Limited Echo, Limited Color Doppler and Cardiac Doppler Indications:    Chest Pain R07.9  History:        Patient has prior history of Echocardiogram examinations, most                 recent 12/27/2021. CAD, COPD; Risk Factors:Hypertension and                 Dyslipidemia. End stage renal disease                 1st degree HB.  Sonographer:    Ronny Flurry Sonographer#2:  Darlina Sicilian RDCS Referring Phys: [id number] [facility] [place] IMPRESSIONS  1. Left ventricular ejection fraction, by estimation, is 65 to 70%. The left ventricle has normal function.  2. The mitral valve is grossly normal. No evidence of mitral valve regurgitation.  3. The aortic valve is calcified. There is mild calcification of the aortic valve. There is mild thickening of the aortic valve. Aortic valve regurgitation is not visualized. Aortic valve sclerosis is present, with no evidence of aortic valve stenosis. Aortic valve mean gradient measures 9.6 mmHg. Aortic valve Vmax measures 2.35 m/s. FINDINGS  Left Ventricle: Left ventricular ejection fraction, by estimation, is 65 to 70%. The left ventricle has normal function. Mitral Valve: The mitral valve is grossly normal. Mild mitral annular calcification. Tricuspid Valve: The tricuspid valve is grossly normal. Tricuspid valve regurgitation is trivial. Aortic Valve: The aortic valve is calcified. There is mild calcification of the aortic valve. There is mild thickening of the aortic valve. Aortic valve regurgitation is not visualized. Aortic valve sclerosis is present, with no evidence of aortic valve stenosis. Aortic valve mean gradient measures 9.6 mmHg. Aortic valve peak gradient measures 22.1 mmHg. Aortic valve area, by VTI measures 1.17 cm. Aorta: The aortic root was not well visualized. LEFT VENTRICLE PLAX  2D LVOT diam:     1.60 cm LV SV:         48 LV SV Index:   24 LVOT Area:     2.01 cm  AORTIC VALVE AV Area (Vmax):    1.23 cm AV Area (Vmean):   1.61 cm AV Area (VTI):      1.17 cm AV Vmax:           234.83 cm/s AV Vmean:          135.199 cm/s AV VTI:            0.414 m AV Peak Grad:      22.1 mmHg AV Mean Grad:      9.6 mmHg LVOT Vmax:         144.00 cm/s LVOT Vmean:        108.000 cm/s LVOT VTI:          0.240 m LVOT/AV VTI ratio: 0.58  SHUNTS Systemic VTI:  0.24 m Systemic Diam: 1.60 cm Candee Furbish MD Electronically signed by Candee Furbish MD Signature Date/Time: 01/19/2022/2:22:29 PM    Final    DG Chest Port 1 View  Result Date: 01/18/2022 CLINICAL DATA:  71 year old male presenting for evaluation of shortness of breath. EXAM: PORTABLE CHEST 1 VIEW COMPARISON:  None Available. FINDINGS: The heart size and mediastinal contours are within normal limits. Both lungs are clear. The visualized skeletal structures are unremarkable. IMPRESSION: No active disease. Electronically Signed   By: Zetta Bills M.D.   On: 01/18/2022 12:08     Patient Profile     71 y.o. male hx of CAD (s/p DES to proximal RCA with PTCA of mid and distal RCA in 07/2021, cath in 12/2021 with DESx2 to mid RCA stenoses with residual 80% PDA stenosis), HTN, HLD, OSA and ESRD (on PD) who is being seen 01/18/2022 for the evaluation of chest pain and elevated troponin values  Assessment & Plan    Chest pain in the setting of CAD (LHC twice this year with residual PDA stenosis) Peptic ulcer disease Mobtiz Type I HB and bradycardia not on BB for this reason HTN and HLD ESRD on PD Mild- Mod AS, Mild AI - no WMA on Echo - continue ASA and Brilinta - continue statin  - continue IMDUR 120 mg and Ranexa (max dose while on PD) - PPI for his PUD - I worry this is not cardiac etiology, but given his PDA disease, elevate troponin (in the setting of ESRD), and him being on maximal medical tolerated therapy, will proceed with LHC - if no intervention will likely be able to sign off this PM; will around outpatient cardiology follow up  For questions or updates, please contact [facility] Please  consult [email or web address] for contact info under Cardiology/STEMI.      Rudean Haskell, MD [place], [address] [place], [place] [address] [phone]  10:30 AM

## 2022-01-21 DIAGNOSIS — D509 Iron deficiency anemia, unspecified: Secondary | ICD-10-CM | POA: Diagnosis not present

## 2022-01-21 DIAGNOSIS — I200 Unstable angina: Secondary | ICD-10-CM | POA: Diagnosis not present

## 2022-01-21 DIAGNOSIS — D631 Anemia in chronic kidney disease: Secondary | ICD-10-CM | POA: Diagnosis not present

## 2022-01-21 DIAGNOSIS — N186 End stage renal disease: Secondary | ICD-10-CM | POA: Diagnosis not present

## 2022-01-21 DIAGNOSIS — I25118 Atherosclerotic heart disease of native coronary artery with other forms of angina pectoris: Secondary | ICD-10-CM | POA: Diagnosis not present

## 2022-01-21 DIAGNOSIS — Z992 Dependence on renal dialysis: Secondary | ICD-10-CM | POA: Diagnosis not present

## 2022-01-21 DIAGNOSIS — N250 Renal osteodystrophy: Secondary | ICD-10-CM | POA: Diagnosis not present

## 2022-01-21 LAB — GLUCOSE, CAPILLARY: Glucose-Capillary: 197 mg/dL — ABNORMAL HIGH (ref 70–99)

## 2022-01-21 LAB — CBC
HCT: 29.2 % — ABNORMAL LOW (ref 39.0–52.0)
Hemoglobin: 9.6 g/dL — ABNORMAL LOW (ref 13.0–17.0)
MCH: 30.2 pg (ref 26.0–34.0)
MCHC: 32.9 g/dL (ref 30.0–36.0)
MCV: 91.8 fL (ref 80.0–100.0)
Platelets: 124 10*3/uL — ABNORMAL LOW (ref 150–400)
RBC: 3.18 MIL/uL — ABNORMAL LOW (ref 4.22–5.81)
RDW: 14.5 % (ref 11.5–15.5)
WBC: 4.5 10*3/uL (ref 4.0–10.5)
nRBC: 0 % (ref 0.0–0.2)

## 2022-01-21 LAB — BASIC METABOLIC PANEL
Anion gap: 12 (ref 5–15)
BUN: 47 mg/dL — ABNORMAL HIGH (ref 8–23)
CO2: 27 mmol/L (ref 22–32)
Calcium: 9.3 mg/dL (ref 8.9–10.3)
Chloride: 95 mmol/L — ABNORMAL LOW (ref 98–111)
Creatinine, Ser: 18.48 mg/dL — ABNORMAL HIGH (ref 0.61–1.24)
GFR, Estimated: 2 mL/min — ABNORMAL LOW (ref >60)
Glucose, Bld: 144 mg/dL — ABNORMAL HIGH (ref 70–99)
Potassium: 3.7 mmol/L (ref 3.5–5.1)
Sodium: 134 mmol/L — ABNORMAL LOW (ref 135–145)

## 2022-01-21 MED ORDER — ISOSORBIDE MONONITRATE ER 120 MG PO TB24: 120.0000 mg | ORAL_TABLET | Freq: Every day | ORAL | 2 refills | Status: DC

## 2022-01-21 MED ORDER — RANOLAZINE ER 500 MG PO TB12: 500.0000 mg | ORAL_TABLET | Freq: Two times a day (BID) | ORAL | 2 refills | Status: DC

## 2022-01-21 MED ORDER — ALBUTEROL SULFATE (2.5 MG/3ML) 0.083% IN NEBU
2.5000 mg | INHALATION_SOLUTION | Freq: Four times a day (QID) | RESPIRATORY_TRACT | Status: DC | PRN
  Administered 2022-01-21: 2.5 mg via RESPIRATORY_TRACT
  Filled 2022-01-21: qty 3

## 2022-01-21 NOTE — Progress Notes (Addendum)
Patient ID: [name] [name], male   DOB: 07-31-1950, 71 y.o.   MRN: [id number] S: No issues with PD overnight. Had Hominy yesterday - stent to mid RCA. No complaints this am. No chest pain/dyspnea.   O:BP (!) 98/52 (BP Location: Left Leg)   Pulse 62   Temp 97.6 F (36.4 C) (Oral)   Resp 17   Ht '5\' 4"'$  (1.626 m)   Wt 100.7 kg   SpO2 100%   BMI 38.11 kg/m   Intake/Output Summary (Last 24 hours) at 01/21/2022 2119 Last data filed at 01/21/2022 0800 Gross per 24 hour  Intake 120 ml  Output --  Net 120 ml    Intake/Output: I/O last 3 completed shifts: In: 669.9 [P.O.:240; I.V.:429.9] Out: 0   Intake/Output this shift:  Total I/O In: 120 [P.O.:120] Out: -  Weight change:  Gen: NAD CVS: RRR Resp:CTA Abd: obese, +BS, soft, nontender Ext: no edema  Recent Labs  Lab 01/18/22 1149 01/18/22 1436 01/20/22 0210 01/21/22 0229  NA 135  --  133* 134*  K 3.9  --  3.5 3.7  CL 94*  --  95* 95*  CO2 29  --  24 27  GLUCOSE 116*  --  130* 144*  BUN 40*  --  51* 47*  CREATININE 17.26* 17.08* 19.15* 18.48*  ALBUMIN 3.2*  --   --   --   CALCIUM 9.8  --  9.5 9.3  AST 12*  --   --   --   ALT 13  --   --   --     Liver Function Tests: Recent Labs  Lab 01/18/22 1149  AST 12*  ALT 13  ALKPHOS 75  BILITOT 0.4  PROT 7.2  ALBUMIN 3.2*    No results for input(s): "LIPASE", "AMYLASE" in the last 168 hours. No results for input(s): "AMMONIA" in the last 168 hours. CBC: Recent Labs  Lab 01/18/22 1149 01/18/22 1436 01/19/22 0044 01/20/22 0210 01/21/22 0229  WBC 5.5 5.7 5.5 4.9 4.5  NEUTROABS 4.1  --   --   --   --   HGB 10.3* 9.8* 9.3* 9.1* 9.6*  HCT 32.1* 30.7* 29.4* 27.9* 29.2*  MCV 94.7 95.6 94.2 91.8 91.8  PLT 155 159 132* 121* 124*    Cardiac Enzymes: No results for input(s): "CKTOTAL", "CKMB", "CKMBINDEX", "TROPONINI" in the last 168 hours. CBG: Recent Labs  Lab 01/20/22 0606 01/20/22 1116 01/20/22 1341 01/20/22 1547 01/21/22 0806  GLUCAP 153* 90 91 85 197*      Iron Studies: No results for input(s): "IRON", "TIBC", "TRANSFERRIN", "FERRITIN" in the last 72 hours. Studies/Results: CARDIAC CATHETERIZATION  Result Date: 01/20/2022 1.  Stable nonobstructive plaquing in the left main, LAD and left circumflex vessels 2.  Severe early in-stent restenosis in the mid RCA, treated successfully with a 4.0 x 16 mm Synergy DES postdilated to high-pressure with a 4.5 mm noncompliant balloon. 3.  Normal LVEDP Recommendations: Continue dual antiplatelet therapy with aspirin and ticagrelor at least 12 months, consider indefinite antiplatelet therapy in this patient with end-stage renal disease and multiple PCI procedures.  From a cardiac perspective, the patient should be eligible for hospital discharge tomorrow   ECHOCARDIOGRAM LIMITED  Result Date: 01/19/2022    ECHOCARDIOGRAM LIMITED REPORT   Patient Name:   [name] [name] Date of Exam: 01/19/2022 Medical Rec #:  [id number]      Height:       64.0 in Accession #:    [id number]  Weight:       223.8 lb Date of Birth:  1950/10/11     BSA:          2.052 m Patient Age:    38 years       BP:           127/64 mmHg Patient Gender: M              HR:           63 bpm. Exam Location:  Inpatient Procedure: Limited Echo, Limited Color Doppler and Cardiac Doppler Indications:    Chest Pain R07.9  History:        Patient has prior history of Echocardiogram examinations, most                 recent 12/27/2021. CAD, COPD; Risk Factors:Hypertension and                 Dyslipidemia. End stage renal disease                 1st degree HB.  Sonographer:    Ronny Flurry Sonographer#2:  Darlina Sicilian RDCS Referring Phys: [id number] [facility] A CHANDRASEKHAR IMPRESSIONS  1. Left ventricular ejection fraction, by estimation, is 65 to 70%. The left ventricle has normal function.  2. The mitral valve is grossly normal. No evidence of mitral valve regurgitation.  3. The aortic valve is calcified. There is mild calcification of the aortic valve. There  is mild thickening of the aortic valve. Aortic valve regurgitation is not visualized. Aortic valve sclerosis is present, with no evidence of aortic valve stenosis. Aortic valve mean gradient measures 9.6 mmHg. Aortic valve Vmax measures 2.35 m/s. FINDINGS  Left Ventricle: Left ventricular ejection fraction, by estimation, is 65 to 70%. The left ventricle has normal function. Mitral Valve: The mitral valve is grossly normal. Mild mitral annular calcification. Tricuspid Valve: The tricuspid valve is grossly normal. Tricuspid valve regurgitation is trivial. Aortic Valve: The aortic valve is calcified. There is mild calcification of the aortic valve. There is mild thickening of the aortic valve. Aortic valve regurgitation is not visualized. Aortic valve sclerosis is present, with no evidence of aortic valve stenosis. Aortic valve mean gradient measures 9.6 mmHg. Aortic valve peak gradient measures 22.1 mmHg. Aortic valve area, by VTI measures 1.17 cm. Aorta: The aortic root was not well visualized. LEFT VENTRICLE PLAX 2D LVOT diam:     1.60 cm LV SV:         48 LV SV Index:   24 LVOT Area:     2.01 cm  AORTIC VALVE AV Area (Vmax):    1.23 cm AV Area (Vmean):   1.61 cm AV Area (VTI):     1.17 cm AV Vmax:           234.83 cm/s AV Vmean:          135.199 cm/s AV VTI:            0.414 m AV Peak Grad:      22.1 mmHg AV Mean Grad:      9.6 mmHg LVOT Vmax:         144.00 cm/s LVOT Vmean:        108.000 cm/s LVOT VTI:          0.240 m LVOT/AV VTI ratio: 0.58  SHUNTS Systemic VTI:  0.24 m Systemic Diam: 1.60 cm Candee Furbish MD Electronically signed by Candee Furbish MD Signature Date/Time: 01/19/2022/2:22:29 PM  Final     aspirin EC  81 mg Oral Daily   calcitRIOL  0.25 mcg Oral Daily   gentamicin cream  1 Application Topical Daily   insulin aspart  0-6 Units Subcutaneous TID WC   insulin aspart  3 Units Subcutaneous TID WC   insulin glargine-yfgn  20 Units Subcutaneous QHS   isosorbide mononitrate  120 mg Oral Daily    linagliptin  5 mg Oral Daily   losartan  25 mg Oral Daily   pantoprazole  40 mg Oral BID AC   ranolazine  500 mg Oral BID   sevelamer carbonate  2,400 mg Oral TID WC   simvastatin  20 mg Oral q morning   sodium chloride flush  3 mL Intravenous Q12H   sodium chloride flush  3 mL Intravenous Q12H   ticagrelor  90 mg Oral BID   torsemide  100 mg Oral Daily    BMET    Component Value Date/Time   NA 134 (L) 01/21/2022 0229   NA 136 12/22/2021 1017   K 3.7 01/21/2022 0229   CL 95 (L) 01/21/2022 0229   CO2 27 01/21/2022 0229   GLUCOSE 144 (H) 01/21/2022 0229   BUN 47 (H) 01/21/2022 0229   BUN 58 (H) 12/22/2021 1017   CREATININE 18.48 (H) 01/21/2022 0229   CREATININE 18.89 (H) 02/09/2020 1126   CALCIUM 9.3 01/21/2022 0229   GFRNONAA 2 (L) 01/21/2022 0229   GFRNONAA 2 (L) 02/09/2020 1126   GFRAA <4 (L) 02/09/2020 1126   CBC    Component Value Date/Time   WBC 4.5 01/21/2022 0229   RBC 3.18 (L) 01/21/2022 0229   HGB 9.6 (L) 01/21/2022 0229   HGB 11.3 (L) 12/22/2021 1017   HCT 29.2 (L) 01/21/2022 0229   HCT 33.7 (L) 12/22/2021 1017   PLT 124 (L) 01/21/2022 0229   PLT 117 (L) 12/22/2021 1017   MCV 91.8 01/21/2022 0229   MCV 91 12/22/2021 1017   MCH 30.2 01/21/2022 0229   MCHC 32.9 01/21/2022 0229   RDW 14.5 01/21/2022 0229   RDW 13.7 12/22/2021 1017   LYMPHSABS 0.6 (L) 01/18/2022 1149   MONOABS 0.6 01/18/2022 1149   EOSABS 0.2 01/18/2022 1149   BASOSABS 0.1 01/18/2022 1149    Dialysis Orders: [facility].  CCPD, 5 exchanges 3 liter fill volumes, 10 min fill time, 1 hour 20 minutes dwell, 20 minute drain.  Last fill Icodextran.  EDW 103 kg. Uses combination of 1.5% and 2.5% depending upon BP and weight.   Assessment/Plan:  Chest pain - pt with complex CAD history.  He has had persistent CP. S/p LHC yesterday stent to mid RCA. Per cards.   ESRD -  continue CCPD nightly   Hypertension/volume  - below EDW.  Will use all 2.5%.  Normal LVEDP on cath.    Anemia  - Hgb 9.6. continue esa as outpatient .  Metabolic bone disease -  continue with home meds  Nutrition -  renal diet, carb modified.  Dispo: Ok for discharge from renal standpoint   Lynnda Child PA-C [facility] 01/21/2022,9:22 AM  I have seen and examined this patient and agree with plan and assessment in the above note with renal recommendations/intervention highlighted.  Pt stable for discharge from renal standpoint. Governor Rooks Alizay Bronkema,MD 01/21/2022 12:54 PM

## 2022-01-21 NOTE — Discharge Summary (Signed)
[facility]  Physician Discharge Summary   Patient ID: [name] [name] MRN: [id number] DOB/AGE: [birth year] 72 y.o.  Admit date: 01/18/2022 Discharge date: 01/21/2022    PCP: Carrolyn Meiers, MD  DISCHARGE DIAGNOSES:  Principal Problem:   Chest pain Active Problems:   Diabetes mellitus with stage 5 chronic kidney disease (HCC)   Hyperlipidemia   Essential hypertension   GERD   Fatty liver   Normocytic anemia   OSA on CPAP   Vitamin D deficiency   CAD (coronary artery disease)   Morbid obesity due to excess calories (HCC)   End stage renal disease (HCC)   Unstable angina (HCC)   Second degree AV block, Mobitz type I   RECOMMENDATIONS FOR OUTPATIENT FOLLOW UP: Cardiology to arrange outpatient follow-up   Home Health: None Equipment/Devices: None  CODE STATUS: Full code  DISCHARGE CONDITION: fair  Diet recommendation: As before  INITIAL HISTORY: 71 year old gentleman with end-stage renal disease on nightly peritoneal dialysis, type 2 diabetes mellitus, history of polysubstance abuse including cocaine and marijuana, OSA, extensive coronary artery disease status post recent drug-eluting stent and angioplasty to the RCA had a cardiac catheterization 12/26/2021 which demonstrated severe single-vessel CAD with sequential 90-95% mid RCA stenosis at the site of angioplasty and previously placed RCA stent demonstrated mild to moderate in-stent restenosis in the distal third and patient subsequently had successful DES x2 to mid RCA.  He was also recently found to have an AV block.  His beta-blockers have been discontinued.  During his recent outpatient visit with cardiology his Imdur was increased from 30 mg to 60 mg.  Reports that he has continued to have chest pain since recent stents placed.  He was hospitalized for further management.   Consultants: Cardiology.  Nephrology   Procedures: Cardiac catheterizatio    HOSPITAL COURSE:   Unstable angina in the  setting of coronary artery disease Patient has had multiple interventions in the last 7 months.  Initial cardiac cath was in January and then subsequently in June.  See cardiology note for details. Troponins noted to be elevated.  Patient remains on heparin infusion.  He is on dual antiplatelet treatment with Brilinta and aspirin.  Statin. Seen by cardiology while he was at [facility].  He was transferred here due to peritoneal dialysis requirements. Dose of Imdur was increased and Ranexa was added.  Echocardiogram shows normal systolic function. Cardiac catheterization did show in-stent stenosis of the RCA.  Intervention was performed.  Seen by cardiology.  Continue with dual antiplatelet treatment.  Cardiology to arrange outpatient follow-up.  Cleared by cardiology for discharge.   End-stage renal disease on peritoneal dialysis Seen by nephrology.  He was dialyzed in the hospital every night.  Obstructive sleep apnea On CPAP  Normocytic anemia Likely anemia of chronic kidney disease.  Hemoglobin is stable.  No evidence of overt bleeding.  Essential hypertension Stable  Recent AV block Taken off of AV blocking agents.   Diabetes mellitus in the setting of end-stage renal disease Continue home medication   Morbid obesity Estimated body mass index is 38.11 kg/m as calculated from the following:   Height as of this encounter: '5\' 4"'$  (1.626 m).   Weight as of this encounter: 100.7 kg.   Patient is stable.  Cleared by cardiology.  Feels well this morning.  Okay for discharge.  PERTINENT LABS:  The results of significant diagnostics from this hospitalization (including imaging, microbiology, ancillary and laboratory) are listed below for reference.  Labs:   Basic Metabolic Panel: Recent Labs  Lab 01/18/22 1149 01/18/22 1436 01/20/22 0210 01/21/22 0229  NA 135  --  133* 134*  K 3.9  --  3.5 3.7  CL 94*  --  95* 95*  CO2 29  --  24 27  GLUCOSE 116*  --  130*  144*  BUN 40*  --  51* 47*  CREATININE 17.26* 17.08* 19.15* 18.48*  CALCIUM 9.8  --  9.5 9.3   Liver Function Tests: Recent Labs  Lab 01/18/22 1149  AST 12*  ALT 13  ALKPHOS 75  BILITOT 0.4  PROT 7.2  ALBUMIN 3.2*    CBC: Recent Labs  Lab 01/18/22 1149 01/18/22 1436 01/19/22 0044 01/20/22 0210 01/21/22 0229  WBC 5.5 5.7 5.5 4.9 4.5  NEUTROABS 4.1  --   --   --   --   HGB 10.3* 9.8* 9.3* 9.1* 9.6*  HCT 32.1* 30.7* 29.4* 27.9* 29.2*  MCV 94.7 95.6 94.2 91.8 91.8  PLT 155 159 132* 121* 124*     CBG: Recent Labs  Lab 01/20/22 0606 01/20/22 1116 01/20/22 1341 01/20/22 1547 01/21/22 0806  GLUCAP 153* 90 91 85 197*     IMAGING STUDIES CARDIAC CATHETERIZATION  Result Date: 01/20/2022 1.  Stable nonobstructive plaquing in the left main, LAD and left circumflex vessels 2.  Severe early in-stent restenosis in the mid RCA, treated successfully with a 4.0 x 16 mm Synergy DES postdilated to high-pressure with a 4.5 mm noncompliant balloon. 3.  Normal LVEDP Recommendations: Continue dual antiplatelet therapy with aspirin and ticagrelor at least 12 months, consider indefinite antiplatelet therapy in this patient with end-stage renal disease and multiple PCI procedures.  From a cardiac perspective, the patient should be eligible for hospital discharge tomorrow   ECHOCARDIOGRAM LIMITED  Result Date: 01/19/2022    ECHOCARDIOGRAM LIMITED REPORT   Patient Name:   [name] [name] Date of Exam: 01/19/2022 Medical Rec #:  [id number]      Height:       64.0 in Accession #:    [id number]     Weight:       223.8 lb Date of Birth:  March 28, 1951     BSA:          2.052 m Patient Age:    71 years       BP:           127/64 mmHg Patient Gender: M              HR:           63 bpm. Exam Location:  Inpatient Procedure: Limited Echo, Limited Color Doppler and Cardiac Doppler Indications:    Chest Pain R07.9  History:        Patient has prior history of Echocardiogram examinations, most                  recent 12/27/2021. CAD, COPD; Risk Factors:Hypertension and                 Dyslipidemia. End stage renal disease                 1st degree HB.  Sonographer:    Ronny Flurry Sonographer#2:  Darlina Sicilian RDCS Referring Phys: [id number] [facility] A CHANDRASEKHAR IMPRESSIONS  1. Left ventricular ejection fraction, by estimation, is 65 to 70%. The left ventricle has normal function.  2. The mitral valve is grossly normal. No evidence of mitral valve regurgitation.  3.  The aortic valve is calcified. There is mild calcification of the aortic valve. There is mild thickening of the aortic valve. Aortic valve regurgitation is not visualized. Aortic valve sclerosis is present, with no evidence of aortic valve stenosis. Aortic valve mean gradient measures 9.6 mmHg. Aortic valve Vmax measures 2.35 m/s. FINDINGS  Left Ventricle: Left ventricular ejection fraction, by estimation, is 65 to 70%. The left ventricle has normal function. Mitral Valve: The mitral valve is grossly normal. Mild mitral annular calcification. Tricuspid Valve: The tricuspid valve is grossly normal. Tricuspid valve regurgitation is trivial. Aortic Valve: The aortic valve is calcified. There is mild calcification of the aortic valve. There is mild thickening of the aortic valve. Aortic valve regurgitation is not visualized. Aortic valve sclerosis is present, with no evidence of aortic valve stenosis. Aortic valve mean gradient measures 9.6 mmHg. Aortic valve peak gradient measures 22.1 mmHg. Aortic valve area, by VTI measures 1.17 cm. Aorta: The aortic root was not well visualized. LEFT VENTRICLE PLAX 2D LVOT diam:     1.60 cm LV SV:         48 LV SV Index:   24 LVOT Area:     2.01 cm  AORTIC VALVE AV Area (Vmax):    1.23 cm AV Area (Vmean):   1.61 cm AV Area (VTI):     1.17 cm AV Vmax:           234.83 cm/s AV Vmean:          135.199 cm/s AV VTI:            0.414 m AV Peak Grad:      22.1 mmHg AV Mean Grad:      9.6 mmHg LVOT Vmax:         144.00 cm/s  LVOT Vmean:        108.000 cm/s LVOT VTI:          0.240 m LVOT/AV VTI ratio: 0.58  SHUNTS Systemic VTI:  0.24 m Systemic Diam: 1.60 cm Candee Furbish MD Electronically signed by Candee Furbish MD Signature Date/Time: 01/19/2022/2:22:29 PM    Final    DG Chest Port 1 View  Result Date: 01/18/2022 CLINICAL DATA:  71 year old male presenting for evaluation of shortness of breath. EXAM: PORTABLE CHEST 1 VIEW COMPARISON:  None Available. FINDINGS: The heart size and mediastinal contours are within normal limits. Both lungs are clear. The visualized skeletal structures are unremarkable. IMPRESSION: No active disease. Electronically Signed   By: Zetta Bills M.D.   On: 01/18/2022 12:08   ECHOCARDIOGRAM COMPLETE  Result Date: 12/27/2021    ECHOCARDIOGRAM REPORT   Patient Name:   [name] [name] [facility] Date of Exam: 12/27/2021 Medical Rec #:  [id number]      Height:       64.0 in Accession #:    [id number]     Weight:       228.2 lb Date of Birth:  1951/04/05     BSA:          2.069 m Patient Age:    7 years       BP:           142/68 mmHg Patient Gender: M              HR:           67 bpm. Exam Location:  Inpatient Procedure: 2D Echo, Cardiac Doppler and Color Doppler Indications:    Ischemic heart disease  History:  Patient has prior history of Echocardiogram examinations. CAD;                 Risk Factors:Hypertension, Diabetes and Sleep Apnea.  Sonographer:    Jyl Heinz Referring Phys: [phone] [facility] IMPRESSIONS  1. Left ventricular ejection fraction, by estimation, is 60 to 65%. The left ventricle has normal function. The left ventricle has no regional wall motion abnormalities. There is mild left ventricular hypertrophy. Left ventricular diastolic parameters are indeterminate.  2. Right ventricular systolic function is normal. The right ventricular size is normal. There is normal pulmonary artery systolic pressure. The estimated right ventricular systolic pressure is 63.1 mmHg.  3. Left atrial size was mildly  dilated.  4. Right atrial size was mildly dilated.  5. The mitral valve is degenerative. Trivial mitral valve regurgitation. No evidence of mitral stenosis. Moderate mitral annular calcification.  6. The aortic valve is tricuspid. Aortic valve regurgitation is mild. Mild to moderate aortic valve stenosis. Vmax 2.8 m/s, MG 59mHg, AVA 1.5 cm^2, DI 0.46  7. The inferior vena cava is normal in size with greater than 50% respiratory variability, suggesting right atrial pressure of 3 mmHg. FINDINGS  Left Ventricle: Left ventricular ejection fraction, by estimation, is 60 to 65%. The left ventricle has normal function. The left ventricle has no regional wall motion abnormalities. The left ventricular internal cavity size was normal in size. There is  mild left ventricular hypertrophy. Left ventricular diastolic parameters are indeterminate. Right Ventricle: The right ventricular size is normal. No increase in right ventricular wall thickness. Right ventricular systolic function is normal. There is normal pulmonary artery systolic pressure. The tricuspid regurgitant velocity is 2.79 m/s, and  with an assumed right atrial pressure of 3 mmHg, the estimated right ventricular systolic pressure is 349.7mmHg. Left Atrium: Left atrial size was mildly dilated. Right Atrium: Right atrial size was mildly dilated. Pericardium: Trivial pericardial effusion is present. Mitral Valve: The mitral valve is degenerative in appearance. Moderate mitral annular calcification. Trivial mitral valve regurgitation. No evidence of mitral valve stenosis. Tricuspid Valve: The tricuspid valve is normal in structure. Tricuspid valve regurgitation is trivial. Aortic Valve: The aortic valve is tricuspid. Aortic valve regurgitation is mild. Aortic regurgitation PHT measures 644 msec. Mild to moderate aortic stenosis is present. Aortic valve mean gradient measures 16.0 mmHg. Aortic valve peak gradient measures 28.1 mmHg. Aortic valve area, by VTI measures  1.61 cm. Pulmonic Valve: The pulmonic valve was not well visualized. Pulmonic valve regurgitation is not visualized. Aorta: The aortic root and ascending aorta are structurally normal, with no evidence of dilitation. Venous: The inferior vena cava is normal in size with greater than 50% respiratory variability, suggesting right atrial pressure of 3 mmHg. IAS/Shunts: The interatrial septum was not well visualized.  LEFT VENTRICLE PLAX 2D LVIDd:         5.10 cm      Diastology LVIDs:         3.10 cm      LV e' medial:    8.92 cm/s LV PW:         1.30 cm      LV E/e' medial:  10.9 LV IVS:        1.30 cm      LV e' lateral:   9.46 cm/s LVOT diam:     2.00 cm      LV E/e' lateral: 10.3 LV SV:         83 LV SV Index:   40 LVOT  Area:     3.14 cm  LV Volumes (MOD) LV vol d, MOD A2C: 115.0 ml LV vol d, MOD A4C: 129.0 ml LV vol s, MOD A2C: 39.6 ml LV vol s, MOD A4C: 46.6 ml LV SV MOD A2C:     75.4 ml LV SV MOD A4C:     129.0 ml LV SV MOD BP:      78.2 ml RIGHT VENTRICLE            IVC RV Basal diam:  3.20 cm    IVC diam: 1.40 cm RV Mid diam:    2.30 cm RV S prime:     9.68 cm/s TAPSE (M-mode): 2.0 cm LEFT ATRIUM             Index        RIGHT ATRIUM           Index LA diam:        4.50 cm 2.17 cm/m   RA Area:     21.40 cm LA Vol (A2C):   86.0 ml 41.56 ml/m  RA Volume:   63.10 ml  30.50 ml/m LA Vol (A4C):   72.4 ml 34.99 ml/m LA Biplane Vol: 82.6 ml 39.92 ml/m  AORTIC VALVE AV Area (Vmax):    1.71 cm AV Area (Vmean):   1.84 cm AV Area (VTI):     1.61 cm AV Vmax:           265.00 cm/s AV Vmean:          189.667 cm/s AV VTI:            0.512 m AV Peak Grad:      28.1 mmHg AV Mean Grad:      16.0 mmHg LVOT Vmax:         144.50 cm/s LVOT Vmean:        111.000 cm/s LVOT VTI:          0.263 m LVOT/AV VTI ratio: 0.51 AI PHT:            644 msec  AORTA Ao Root diam: 3.20 cm Ao Asc diam:  3.40 cm MITRAL VALVE                TRICUSPID VALVE MV Area (PHT): 2.54 cm     TR Peak grad:   31.1 mmHg MV Decel Time: 299 msec     TR  Vmax:        279.00 cm/s MV E velocity: 97.40 cm/s MV A velocity: 112.00 cm/s  SHUNTS MV E/A ratio:  0.87         Systemic VTI:  0.26 m                             Systemic Diam: 2.00 cm Oswaldo Milian MD Electronically signed by Oswaldo Milian MD Signature Date/Time: 12/27/2021/10:49:38 AM    Final    CARDIAC CATHETERIZATION  Result Date: 12/26/2021 Conclusions: Severe single-vessel coronary artery disease with sequential 90-95% mid RCA stenoses at sites of angioplasty in 07/2021.  Previously placed proximal RCA stent demonstrates mild-moderate in-stent restenosis in its distal third.  There is also 80% lesion in the distal rPDA. Stable appearance of mild-moderate, nonobstructive CAD involving the left coronary artery. Mildly elevated left ventricular filling pressure (LVEDP 15-20 mmHg). Successful PCI to sequential mid RCA stenoses using overlapping Onyx Frontier 3.5 x 18 mm and 3.5 x 26 mm drug-eluting stents (more proximal stent also  overlaps previously placed stent) with 0% residual stenosis and TIMI-3 flow. Recommendations: Admit for overnight extended recovery. Consult nephrology to assist with continuation of home PD regimen. Obtain echocardiogram to reassess LVEF. Continue dual antiplatelet therapy with aspirin and ticagrelor for at least 12 months from NSTEMI in 07/2021, though ideally indefinite. Aggressive secondary prevention. Nelva Bush, MD [facility]   DISCHARGE EXAMINATION: Vitals:   01/20/22 1905 01/20/22 1941 01/21/22 0010 01/21/22 0421  BP: (!) 150/90 (!) 152/75 139/65 (!) 98/52  Pulse: (!) 0 61 (!) 139 62  Resp:  '20 19 17  '$ Temp:  97.7 F (36.5 C) 98.1 F (36.7 C) 97.6 F (36.4 C)  TempSrc:  Oral Oral Oral  SpO2:  98% 97% 100%  Weight:      Height:       General appearance: Awake alert.  In no distress Resp: Clear to auscultation bilaterally.  Normal effort Cardio: S1-S2 is normal regular.  No S3-S4.  No rubs murmurs or bruit GI: Abdomen is soft.   Nontender nondistended.  Bowel sounds are present normal.  No masses organomegaly Extremities: No edema.  Full range of motion of lower extremities. Neurologic: Alert and oriented x3.  No focal neurological deficits.    DISPOSITION: Home  Discharge Instructions     AMB Referral to Cardiac Rehabilitation - Phase II   Complete by: As directed    Diagnosis: NSTEMI   After initial evaluation and assessments completed: Virtual Based Care may be provided alone or in conjunction with Phase 2 Cardiac Rehab based on patient barriers.: Yes   Diet - low sodium heart healthy   Complete by: As directed    Diet Carb Modified   Complete by: As directed    Discharge instructions   Complete by: As directed    Please take your medications as prescribed.  Please be sure to follow-up with your cardiologist.  They will schedule an appointment for you.  You were cared for by a hospitalist during your hospital stay. If you have any questions about your discharge medications or the care you received while you were in the hospital after you are discharged, you can call the unit and asked to speak with the hospitalist on call if the hospitalist that took care of you is not available. Once you are discharged, your primary care physician will handle any further medical issues. Please note that NO REFILLS for any discharge medications will be authorized once you are discharged, as it is imperative that you return to your primary care physician (or establish a relationship with a primary care physician if you do not have one) for your aftercare needs so that they can reassess your need for medications and monitor your lab values. If you do not have a primary care physician, you can call [phone] for a physician referral.   Increase activity slowly   Complete by: As directed    No wound care   Complete by: As directed           Allergies as of 01/21/2022       Reactions   Opana [oxymorphone Hcl] Itching    Oxymorphone Itching   Tramadol Itching        Medication List     STOP taking these medications    budesonide-formoterol 160-4.5 MCG/ACT inhaler Commonly known as: SYMBICORT       TAKE these medications    albuterol 108 (90 Base) MCG/ACT inhaler Commonly known as: VENTOLIN HFA Inhale 2 puffs into the lungs every  6 (six) hours as needed for wheezing or shortness of breath.   aspirin EC 81 MG tablet Take 81 mg by mouth daily. Swallow whole.   calcitRIOL 0.25 MCG capsule Commonly known as: ROCALTROL Take 0.25 mcg by mouth daily.   Droplet Pen Needles 31G X 5 MM Misc Generic drug: Insulin Pen Needle Use to inject insulin daily as directed   glucose blood test strip 1 each by Other route 2 (two) times daily. Use as instructed to check blood glucose twice daily   isosorbide mononitrate 120 MG 24 hr tablet Commonly known as: IMDUR Take 1 tablet (120 mg total) by mouth daily. What changed:  medication strength how much to take   Lantus SoloStar 100 UNIT/ML Solostar Pen Generic drug: insulin glargine Inject 22 Units into the skin at bedtime.   losartan 25 MG tablet Commonly known as: COZAAR Take 1 tablet (25 mg total) by mouth daily.   nitroGLYCERIN 0.4 MG SL tablet Commonly known as: NITROSTAT Place 1 tablet (0.4 mg total) under the tongue every 5 (five) minutes as needed for chest pain.   pantoprazole 40 MG tablet Commonly known as: PROTONIX Take 1 tablet (40 mg total) by mouth 2 (two) times daily before a meal.   ranolazine 500 MG 12 hr tablet Commonly known as: RANEXA Take 1 tablet (500 mg total) by mouth 2 (two) times daily.   sevelamer 800 MG tablet Commonly known as: RENAGEL Take 2,400 mg by mouth 3 (three) times daily with meals.   simvastatin 20 MG tablet Commonly known as: ZOCOR Take 20 mg by mouth every morning.   ticagrelor 90 MG Tabs tablet Commonly known as: BRILINTA Take 1 tablet (90 mg total) by mouth 2 (two) times daily.   torsemide  100 MG tablet Commonly known as: DEMADEX Take 100 mg by mouth daily.   Tradjenta 5 MG Tabs tablet Generic drug: linagliptin TAKE 1 TABLET EVERY DAY   Vitamin D (Ergocalciferol) 1.25 MG (50000 UNIT) Caps capsule Commonly known as: DRISDOL Take 50,000 Units by mouth every 7 (seven) days. Monday          Follow-up Information     [name], [name], MD Follow up.   Specialty: Internal Medicine Why: follow-up on 01/24/22 '@08'$ :45 Contact information: [place] [address] [phone]         Warren Lacy, PA-C Follow up on 02/09/2022.   Specialty: Internal Medicine Why: '@9'$ :15 for hospital follow up with Dr. Lysbeth Penner PA Contact information: [address] [place] [address] [phone]                 TOTAL DISCHARGE TIME: 35 minutes  [facility] Pager on [email or web address]  01/22/2022, 12:14 PM

## 2022-01-21 NOTE — Progress Notes (Signed)
Progress Note  Patient Name: [name] [name] Date of Encounter: 01/21/2022  Primary Cardiologist: Pixie Casino, MD   Subjective   No further chest pain today.  Status post RCA stent  Inpatient Medications    Scheduled Meds:  aspirin EC  81 mg Oral Daily   calcitRIOL  0.25 mcg Oral Daily   gentamicin cream  1 Application Topical Daily   insulin aspart  0-6 Units Subcutaneous TID WC   insulin aspart  3 Units Subcutaneous TID WC   insulin glargine-yfgn  20 Units Subcutaneous QHS   isosorbide mononitrate  120 mg Oral Daily   linagliptin  5 mg Oral Daily   losartan  25 mg Oral Daily   pantoprazole  40 mg Oral BID AC   ranolazine  500 mg Oral BID   sevelamer carbonate  2,400 mg Oral TID WC   simvastatin  20 mg Oral q morning   sodium chloride flush  3 mL Intravenous Q12H   sodium chloride flush  3 mL Intravenous Q12H   ticagrelor  90 mg Oral BID   torsemide  100 mg Oral Daily   Continuous Infusions:  sodium chloride 10 mL/hr at 01/20/22 0457   sodium chloride     dialysis solution 2.5% low-MG/low-CA     PRN Meds: sodium chloride, acetaminophen, albuterol, nitroGLYCERIN, ondansetron (ZOFRAN) IV, sodium chloride flush   Vital Signs    Vitals:   01/20/22 1755 01/20/22 1941 01/21/22 0010 01/21/22 0421  BP:  (!) 152/75 139/65 (!) 98/52  Pulse:  61 (!) 139 62  Resp:  '20 19 17  '$ Temp:  97.7 F (36.5 C) 98.1 F (36.7 C) 97.6 F (36.4 C)  TempSrc:  Oral Oral Oral  SpO2: 97% 98% 97% 100%  Weight:      Height:        Intake/Output Summary (Last 24 hours) at 01/21/2022 0841 Last data filed at 01/21/2022 0800 Gross per 24 hour  Intake 120 ml  Output --  Net 120 ml    Filed Weights   01/18/22 1121 01/19/22 0314 01/20/22 0230  Weight: 101.6 kg 101.5 kg 100.7 kg    Telemetry    Sinus rhythm-personally reviewed  Physical Exam   GEN: Well nourished, well developed, in no acute distress  HEENT: normal  Neck: no JVD, carotid bruits, or masses Cardiac: RRR; no  murmurs, rubs, or gallops,no edema  Respiratory:  clear to auscultation bilaterally, normal work of breathing GI: soft, nontender, nondistended, + BS MS: no deformity or atrophy  Skin: warm and dry Neuro:  Strength and sensation are intact Psych: euthymic mood, full affect   Labs    Chemistry Recent Labs  Lab 01/18/22 1149 01/18/22 1436 01/20/22 0210 01/21/22 0229  NA 135  --  133* 134*  K 3.9  --  3.5 3.7  CL 94*  --  95* 95*  CO2 29  --  24 27  GLUCOSE 116*  --  130* 144*  BUN 40*  --  51* 47*  CREATININE 17.26* 17.08* 19.15* 18.48*  CALCIUM 9.8  --  9.5 9.3  PROT 7.2  --   --   --   ALBUMIN 3.2*  --   --   --   AST 12*  --   --   --   ALT 13  --   --   --   ALKPHOS 75  --   --   --   BILITOT 0.4  --   --   --  GFRNONAA 3* 3* 2* 2*  ANIONGAP 12  --  14 12      Hematology Recent Labs  Lab 01/19/22 0044 01/20/22 0210 01/21/22 0229  WBC 5.5 4.9 4.5  RBC 3.12* 3.04* 3.18*  HGB 9.3* 9.1* 9.6*  HCT 29.4* 27.9* 29.2*  MCV 94.2 91.8 91.8  MCH 29.8 29.9 30.2  MCHC 31.6 32.6 32.9  RDW 14.5 14.5 14.5  PLT 132* 121* 124*     Cardiac EnzymesNo results for input(s): "TROPONINI" in the last 168 hours. No results for input(s): "TROPIPOC" in the last 168 hours.   BNPNo results for input(s): "BNP", "PROBNP" in the last 168 hours.   DDimer No results for input(s): "DDIMER" in the last 168 hours.   Radiology    CARDIAC CATHETERIZATION  Result Date: 01/20/2022 1.  Stable nonobstructive plaquing in the left main, LAD and left circumflex vessels 2.  Severe early in-stent restenosis in the mid RCA, treated successfully with a 4.0 x 16 mm Synergy DES postdilated to high-pressure with a 4.5 mm noncompliant balloon. 3.  Normal LVEDP Recommendations: Continue dual antiplatelet therapy with aspirin and ticagrelor at least 12 months, consider indefinite antiplatelet therapy in this patient with end-stage renal disease and multiple PCI procedures.  From a cardiac perspective, the  patient should be eligible for hospital discharge tomorrow   ECHOCARDIOGRAM LIMITED  Result Date: 01/19/2022    ECHOCARDIOGRAM LIMITED REPORT   Patient Name:   [name] [name] Date of Exam: 01/19/2022 Medical Rec #:  [id number]      Height:       64.0 in Accession #:    [id number]     Weight:       223.8 lb Date of Birth:  September 20, 1950     BSA:          2.052 m Patient Age:    39 years       BP:           127/64 mmHg Patient Gender: M              HR:           63 bpm. Exam Location:  Inpatient Procedure: Limited Echo, Limited Color Doppler and Cardiac Doppler Indications:    Chest Pain R07.9  History:        Patient has prior history of Echocardiogram examinations, most                 recent 12/27/2021. CAD, COPD; Risk Factors:Hypertension and                 Dyslipidemia. End stage renal disease                 1st degree HB.  Sonographer:    Ronny Flurry Sonographer#2:  Darlina Sicilian RDCS Referring Phys: [id number] [facility] A CHANDRASEKHAR IMPRESSIONS  1. Left ventricular ejection fraction, by estimation, is 65 to 70%. The left ventricle has normal function.  2. The mitral valve is grossly normal. No evidence of mitral valve regurgitation.  3. The aortic valve is calcified. There is mild calcification of the aortic valve. There is mild thickening of the aortic valve. Aortic valve regurgitation is not visualized. Aortic valve sclerosis is present, with no evidence of aortic valve stenosis. Aortic valve mean gradient measures 9.6 mmHg. Aortic valve Vmax measures 2.35 m/s. FINDINGS  Left Ventricle: Left ventricular ejection fraction, by estimation, is 65 to 70%. The left ventricle has normal function. Mitral Valve: The mitral valve is grossly  normal. Mild mitral annular calcification. Tricuspid Valve: The tricuspid valve is grossly normal. Tricuspid valve regurgitation is trivial. Aortic Valve: The aortic valve is calcified. There is mild calcification of the aortic valve. There is mild thickening of the aortic valve.  Aortic valve regurgitation is not visualized. Aortic valve sclerosis is present, with no evidence of aortic valve stenosis. Aortic valve mean gradient measures 9.6 mmHg. Aortic valve peak gradient measures 22.1 mmHg. Aortic valve area, by VTI measures 1.17 cm. Aorta: The aortic root was not well visualized. LEFT VENTRICLE PLAX 2D LVOT diam:     1.60 cm LV SV:         48 LV SV Index:   24 LVOT Area:     2.01 cm  AORTIC VALVE AV Area (Vmax):    1.23 cm AV Area (Vmean):   1.61 cm AV Area (VTI):     1.17 cm AV Vmax:           234.83 cm/s AV Vmean:          135.199 cm/s AV VTI:            0.414 m AV Peak Grad:      22.1 mmHg AV Mean Grad:      9.6 mmHg LVOT Vmax:         144.00 cm/s LVOT Vmean:        108.000 cm/s LVOT VTI:          0.240 m LVOT/AV VTI ratio: 0.58  SHUNTS Systemic VTI:  0.24 m Systemic Diam: 1.60 cm Candee Furbish MD Electronically signed by Candee Furbish MD Signature Date/Time: 01/19/2022/2:22:29 PM    Final      Patient Profile     71 y.o. male hx of CAD (s/p DES to proximal RCA with PTCA of mid and distal RCA in 07/2021, cath in 12/2021 with DESx2 to mid RCA stenoses with residual 80% PDA stenosis), HTN, HLD, OSA and ESRD (on PD) who is being seen 01/18/2022 for the evaluation of chest pain and elevated troponin values  Assessment & Plan    1.  Unstable angina: Status post drug-eluting stent to the RCA for in-stent restenosis.  Malayasia Mirkin need aspirin and Brilinta for 12 months at least.  No further chest pain.  At this point, okay for discharge.  2.  Hyperlipidemia: Continue statin.  Goal LDL less than 70.  3.  Mobitz 1 AV block: Continue to hold rate controlling agents  4.  Mild to moderate aortic stenosis, mild aortic insufficiency: No acute symptoms.  Continue with current management.  5.  End-stage renal disease: Continue dialysis per nephrology   For questions or updates, please contact [facility] Please consult [email or web address] for contact info under Cardiology/STEMI.       Allegra Lai, MD Cardiologist [facility]  [address] [place], [place] [address] [phone]  8:41 AM

## 2022-01-21 NOTE — Progress Notes (Addendum)
CARDIAC REHAB PHASE I   Came to walk pt. Pt was doing dialysis so we discussed ed. Ed given to pt. Discussed restrictions, MI booklet, NTG use, ASA / Brilinta, heart healthy and diabetic diet, and exercise guidelines. Pt showed understanding of ed. Will refer to [place]. Pt stated that he has been attending there program. Will continue to follow.  [place], MS, ACSM-CEP 01/21/2022 9:45 AM

## 2022-01-21 NOTE — Progress Notes (Signed)
Discharge teaching done.  Patient waiting on wife to pick up.

## 2022-01-22 DIAGNOSIS — Z992 Dependence on renal dialysis: Secondary | ICD-10-CM | POA: Diagnosis not present

## 2022-01-22 DIAGNOSIS — D631 Anemia in chronic kidney disease: Secondary | ICD-10-CM | POA: Diagnosis not present

## 2022-01-22 DIAGNOSIS — N250 Renal osteodystrophy: Secondary | ICD-10-CM | POA: Diagnosis not present

## 2022-01-22 DIAGNOSIS — D509 Iron deficiency anemia, unspecified: Secondary | ICD-10-CM | POA: Diagnosis not present

## 2022-01-22 DIAGNOSIS — N186 End stage renal disease: Secondary | ICD-10-CM | POA: Diagnosis not present

## 2022-01-22 LAB — LIPOPROTEIN A (LPA): Lipoprotein (a): 51.2 nmol/L — ABNORMAL HIGH (ref <75.0)

## 2022-01-23 ENCOUNTER — Other Ambulatory Visit: Payer: Self-pay | Admitting: *Deleted

## 2022-01-23 ENCOUNTER — Encounter (HOSPITAL_COMMUNITY): Payer: Self-pay | Admitting: Cardiovascular Disease

## 2022-01-23 ENCOUNTER — Other Ambulatory Visit (HOSPITAL_COMMUNITY): Payer: Self-pay

## 2022-01-23 DIAGNOSIS — D631 Anemia in chronic kidney disease: Secondary | ICD-10-CM | POA: Diagnosis not present

## 2022-01-23 DIAGNOSIS — Z992 Dependence on renal dialysis: Secondary | ICD-10-CM | POA: Diagnosis not present

## 2022-01-23 DIAGNOSIS — Z955 Presence of coronary angioplasty implant and graft: Secondary | ICD-10-CM

## 2022-01-23 DIAGNOSIS — I214 Non-ST elevation (NSTEMI) myocardial infarction: Secondary | ICD-10-CM

## 2022-01-23 DIAGNOSIS — N186 End stage renal disease: Secondary | ICD-10-CM | POA: Diagnosis not present

## 2022-01-23 DIAGNOSIS — D509 Iron deficiency anemia, unspecified: Secondary | ICD-10-CM | POA: Diagnosis not present

## 2022-01-23 DIAGNOSIS — N250 Renal osteodystrophy: Secondary | ICD-10-CM | POA: Diagnosis not present

## 2022-01-23 NOTE — Patient Outreach (Signed)
  Care Coordination [facility] Note Transition Care Management Follow-up Telephone Call Date of discharge and from where: 01/21/22 [facility] How have you been since you were released from the hospital? OK, no problems. Any questions or concerns? No  Items Reviewed: Did the pt receive and understand the discharge instructions provided? Yes  Medications obtained and verified? Yes  Other? No  Any new allergies since your discharge? No  Dietary orders reviewed? Yes Do you have support at home? Yes   Home Care and Equipment/Supplies: Were home health services ordered? not applicable If so, what is the name of the agency? N/A  Has the agency set up a time to come to the patient's home? not applicable Were any new equipment or medical supplies ordered?  No What is the name of the medical supply agency? N/AWere you able to get the supplies/equipment? not applicable Do you have any questions related to the use of the equipment or supplies? No  Functional Questionnaire: (I = Independent and D = Dependent) ADLs: I  Bathing/Dressing- I   Meal Prep- I  Eating- I  Maintaining continence- I  Transferring/Ambulation- I  Managing Meds- I  Follow up appointments reviewed:  PCP Hospital f/u appt confirmed? Yes  Scheduled to see cARDIOLOGY  on 7/ 27  9:15 A,. [place] f/u appt confirmed? Yes  Scheduled to see CARDIOLOGY on 02/09/22 @ 9:15. Are transportation arrangements needed? No  If their condition worsens, is the pt aware to call PCP or go to the Emergency Dept.? Yes Was the patient provided with contact information for the PCP's office or ED? Yes Was to pt encouraged to call back with questions or concerns? Yes  SDOH assessments and interventions completed:   Yes  Care Coordination Interventions Activated:  Yes Care Coordination Interventions:  PCP follow up appointment requested  Encounter Outcome:  CALLED DR. [name], LEFT MESSAGE THAT PT NEEDS TO BE SCHEDULED FOR FOLLOW UP IF  NOT ALREADY DONE.  [name] [name], MSN, [facility] Gerontological Nurse Practitioner [facility] Care Management [phone]

## 2022-01-24 DIAGNOSIS — Z955 Presence of coronary angioplasty implant and graft: Secondary | ICD-10-CM | POA: Diagnosis not present

## 2022-01-24 DIAGNOSIS — I2510 Atherosclerotic heart disease of native coronary artery without angina pectoris: Secondary | ICD-10-CM | POA: Diagnosis not present

## 2022-01-24 DIAGNOSIS — D631 Anemia in chronic kidney disease: Secondary | ICD-10-CM | POA: Diagnosis not present

## 2022-01-24 DIAGNOSIS — N186 End stage renal disease: Secondary | ICD-10-CM | POA: Diagnosis not present

## 2022-01-24 DIAGNOSIS — E1165 Type 2 diabetes mellitus with hyperglycemia: Secondary | ICD-10-CM | POA: Diagnosis not present

## 2022-01-24 DIAGNOSIS — D509 Iron deficiency anemia, unspecified: Secondary | ICD-10-CM | POA: Diagnosis not present

## 2022-01-24 DIAGNOSIS — N250 Renal osteodystrophy: Secondary | ICD-10-CM | POA: Diagnosis not present

## 2022-01-24 DIAGNOSIS — Z992 Dependence on renal dialysis: Secondary | ICD-10-CM | POA: Diagnosis not present

## 2022-01-24 DIAGNOSIS — I200 Unstable angina: Secondary | ICD-10-CM | POA: Diagnosis not present

## 2022-01-25 DIAGNOSIS — D509 Iron deficiency anemia, unspecified: Secondary | ICD-10-CM | POA: Diagnosis not present

## 2022-01-25 DIAGNOSIS — N250 Renal osteodystrophy: Secondary | ICD-10-CM | POA: Diagnosis not present

## 2022-01-25 DIAGNOSIS — N186 End stage renal disease: Secondary | ICD-10-CM | POA: Diagnosis not present

## 2022-01-25 DIAGNOSIS — Z992 Dependence on renal dialysis: Secondary | ICD-10-CM | POA: Diagnosis not present

## 2022-01-25 DIAGNOSIS — D631 Anemia in chronic kidney disease: Secondary | ICD-10-CM | POA: Diagnosis not present

## 2022-01-26 ENCOUNTER — Telehealth: Payer: Self-pay | Admitting: Internal Medicine

## 2022-01-26 DIAGNOSIS — N250 Renal osteodystrophy: Secondary | ICD-10-CM | POA: Diagnosis not present

## 2022-01-26 DIAGNOSIS — N186 End stage renal disease: Secondary | ICD-10-CM | POA: Diagnosis not present

## 2022-01-26 DIAGNOSIS — D509 Iron deficiency anemia, unspecified: Secondary | ICD-10-CM | POA: Diagnosis not present

## 2022-01-26 DIAGNOSIS — I441 Atrioventricular block, second degree: Secondary | ICD-10-CM

## 2022-01-26 DIAGNOSIS — Z992 Dependence on renal dialysis: Secondary | ICD-10-CM | POA: Diagnosis not present

## 2022-01-26 DIAGNOSIS — D631 Anemia in chronic kidney disease: Secondary | ICD-10-CM | POA: Diagnosis not present

## 2022-01-26 NOTE — Telephone Encounter (Signed)
Patient returned RN's call regarding monitor alert.

## 2022-01-26 NOTE — Telephone Encounter (Signed)
-  Pt called reporting he has been asymptomatic -Pt made aware of MD recommendations and verbalized understanding -EP referral placed and message sent for scheduling

## 2022-01-26 NOTE — Telephone Encounter (Signed)
     Cardiac Monitor Alert  Date of alert:  01/26/2022   Patient Name: [name] [name]  DOB: 13-Mar-1951  MRN: [id number]   [facility] Cardiologist: Pixie Casino, MD  [facility] EP:  None    Monitor Information: Long Term Monitor [ZioXT]  Reason:  Newly diagnosed 2nd degree AV block  Ordering provider:  Dr. Debara Pickett   Alert Complete Heart Block This is the 1st alert for this rhythm. Patient had 4 events total in the 12 day period, these events were auto triggered, no symptoms reported to company  Next Cardiology Appointment {  Date:  02/09/22  Provider:  Caron Presume   The patient could NOT be reached by telephone today.  01/26/22  LEFT MESSAGE FOR PATIENT TO CALL BACK TO DETERMINE IF SYMPTOMATIC  Arrhythmia, symptoms and history reviewed with Dr. Percival Spanish.   Plan:  Keep follow up as scheduled, forward to Dr. Debara Pickett-  Chriss Driver, RN  01/26/2022 12:17 PM

## 2022-01-26 NOTE — Telephone Encounter (Signed)
Thanks .Marland Kitchen Looks like he has follow-up with Caron Presume in 2 weeks - will likely need to see EP asap.  Dr. Lemmie Evens

## 2022-01-26 NOTE — Telephone Encounter (Signed)
Irhythym calling with critical reading on Zio patch  Reference # [id number]

## 2022-01-27 DIAGNOSIS — Z992 Dependence on renal dialysis: Secondary | ICD-10-CM | POA: Diagnosis not present

## 2022-01-27 DIAGNOSIS — N186 End stage renal disease: Secondary | ICD-10-CM | POA: Diagnosis not present

## 2022-01-27 DIAGNOSIS — D509 Iron deficiency anemia, unspecified: Secondary | ICD-10-CM | POA: Diagnosis not present

## 2022-01-27 DIAGNOSIS — N250 Renal osteodystrophy: Secondary | ICD-10-CM | POA: Diagnosis not present

## 2022-01-27 DIAGNOSIS — D631 Anemia in chronic kidney disease: Secondary | ICD-10-CM | POA: Diagnosis not present

## 2022-01-28 DIAGNOSIS — Z992 Dependence on renal dialysis: Secondary | ICD-10-CM | POA: Diagnosis not present

## 2022-01-28 DIAGNOSIS — D631 Anemia in chronic kidney disease: Secondary | ICD-10-CM | POA: Diagnosis not present

## 2022-01-28 DIAGNOSIS — N186 End stage renal disease: Secondary | ICD-10-CM | POA: Diagnosis not present

## 2022-01-28 DIAGNOSIS — N250 Renal osteodystrophy: Secondary | ICD-10-CM | POA: Diagnosis not present

## 2022-01-28 DIAGNOSIS — D509 Iron deficiency anemia, unspecified: Secondary | ICD-10-CM | POA: Diagnosis not present

## 2022-01-29 DIAGNOSIS — N250 Renal osteodystrophy: Secondary | ICD-10-CM | POA: Diagnosis not present

## 2022-01-29 DIAGNOSIS — D509 Iron deficiency anemia, unspecified: Secondary | ICD-10-CM | POA: Diagnosis not present

## 2022-01-29 DIAGNOSIS — Z992 Dependence on renal dialysis: Secondary | ICD-10-CM | POA: Diagnosis not present

## 2022-01-29 DIAGNOSIS — N186 End stage renal disease: Secondary | ICD-10-CM | POA: Diagnosis not present

## 2022-01-29 DIAGNOSIS — D631 Anemia in chronic kidney disease: Secondary | ICD-10-CM | POA: Diagnosis not present

## 2022-01-30 DIAGNOSIS — Z794 Long term (current) use of insulin: Secondary | ICD-10-CM | POA: Diagnosis not present

## 2022-01-30 DIAGNOSIS — D631 Anemia in chronic kidney disease: Secondary | ICD-10-CM | POA: Diagnosis not present

## 2022-01-30 DIAGNOSIS — D509 Iron deficiency anemia, unspecified: Secondary | ICD-10-CM | POA: Diagnosis not present

## 2022-01-30 DIAGNOSIS — N186 End stage renal disease: Secondary | ICD-10-CM | POA: Diagnosis not present

## 2022-01-30 DIAGNOSIS — E119 Type 2 diabetes mellitus without complications: Secondary | ICD-10-CM | POA: Diagnosis not present

## 2022-01-30 DIAGNOSIS — N250 Renal osteodystrophy: Secondary | ICD-10-CM | POA: Diagnosis not present

## 2022-01-30 DIAGNOSIS — Z992 Dependence on renal dialysis: Secondary | ICD-10-CM | POA: Diagnosis not present

## 2022-01-31 ENCOUNTER — Telehealth: Payer: Self-pay | Admitting: Internal Medicine

## 2022-01-31 DIAGNOSIS — Z992 Dependence on renal dialysis: Secondary | ICD-10-CM | POA: Diagnosis not present

## 2022-01-31 DIAGNOSIS — D631 Anemia in chronic kidney disease: Secondary | ICD-10-CM | POA: Diagnosis not present

## 2022-01-31 DIAGNOSIS — D509 Iron deficiency anemia, unspecified: Secondary | ICD-10-CM | POA: Diagnosis not present

## 2022-01-31 DIAGNOSIS — N250 Renal osteodystrophy: Secondary | ICD-10-CM | POA: Diagnosis not present

## 2022-01-31 DIAGNOSIS — N186 End stage renal disease: Secondary | ICD-10-CM | POA: Diagnosis not present

## 2022-01-31 MED ORDER — ISOSORBIDE MONONITRATE ER 120 MG PO TB24: ORAL_TABLET | ORAL | 2 refills | Status: DC

## 2022-01-31 NOTE — Telephone Encounter (Signed)
Pt c/o medication issue:  1. Name of Medication: ranolazine (RANEXA) 500 MG 12 hr tablet isosorbide mononitrate (IMDUR) 120 MG 24 hr tablet 2. How are you currently taking this medication (dosage and times per day)? As prescribed   3. Are you having a reaction (difficulty breathing--STAT)?   4. What is your medication issue?  Pt states that he was in hospital recently and that he believes the change in these medication may be causing him to be sick. He states that he is nauseous and lightheaded. The ranolazine is a new med and the isosorbide mononitrate has gone up is dosage. Requesting call back to discuss this.

## 2022-01-31 NOTE — Telephone Encounter (Signed)
Spoke with pt, aware of the recommendations. He just picked up a large quantity of the 120 mg tablets. He will take 1/2 and 1/4 of the 120 mg tablet once daily. He is aware to call back if he continues to have issues.

## 2022-01-31 NOTE — Telephone Encounter (Signed)
Spoke with pt, he reports the nausea and lightheadedness is there all day. He reports he can not do anything but lay down. He also had an episode of vomiting yesterday and feels that way now. His blood pressure is running 109/65. He has not taken any medications this morning. Aware will forward to the pharm md to review and if they have any suggestions.

## 2022-01-31 NOTE — Telephone Encounter (Signed)
Have him drop the isosorbide down to 90 mg.  If symptoms continue much after that may need to d/c ranexa, but he should call first

## 2022-02-01 ENCOUNTER — Other Ambulatory Visit: Payer: Self-pay | Admitting: *Deleted

## 2022-02-01 ENCOUNTER — Telehealth: Payer: Self-pay | Admitting: Internal Medicine

## 2022-02-01 DIAGNOSIS — I25118 Atherosclerotic heart disease of native coronary artery with other forms of angina pectoris: Secondary | ICD-10-CM

## 2022-02-01 DIAGNOSIS — D509 Iron deficiency anemia, unspecified: Secondary | ICD-10-CM | POA: Diagnosis not present

## 2022-02-01 DIAGNOSIS — N186 End stage renal disease: Secondary | ICD-10-CM | POA: Diagnosis not present

## 2022-02-01 DIAGNOSIS — N250 Renal osteodystrophy: Secondary | ICD-10-CM | POA: Diagnosis not present

## 2022-02-01 DIAGNOSIS — D631 Anemia in chronic kidney disease: Secondary | ICD-10-CM | POA: Diagnosis not present

## 2022-02-01 DIAGNOSIS — Z992 Dependence on renal dialysis: Secondary | ICD-10-CM | POA: Diagnosis not present

## 2022-02-01 DIAGNOSIS — I214 Non-ST elevation (NSTEMI) myocardial infarction: Secondary | ICD-10-CM

## 2022-02-01 MED ORDER — ISOSORBIDE MONONITRATE ER 30 MG PO TB24: 30.0000 mg | ORAL_TABLET | Freq: Every day | ORAL | 2 refills | Status: DC

## 2022-02-01 MED ORDER — ISOSORBIDE MONONITRATE ER 60 MG PO TB24: 60.0000 mg | ORAL_TABLET | Freq: Every day | ORAL | 2 refills | Status: DC

## 2022-02-01 NOTE — Telephone Encounter (Signed)
Called pt, spoke with his wife. She states he stated having issues after he started this medication. "He is throwing up now and has a headache. It's so hard to cut those tiny pills. Can y'all send in a new prescription?" Will get message to pharmacy for advise.

## 2022-02-01 NOTE — Telephone Encounter (Signed)
Rx for Imdur '90mg'$  sent to pharmacy. ('30mg'$  tablet + '60mg'$  tablet)

## 2022-02-01 NOTE — Telephone Encounter (Signed)
  STAT if patient feels like he/she is going to faint   Are you dizzy now? yes  Do you feel faint or have you passed out? no  Do you have any other symptoms? Pt states he is experiencing nausea and he has a headache.   Have you checked your HR and BP (record if available)? No   Pt states he called yesterday about a med issue with   isosorbide mononitrate (IMDUR) 120 MG 24 hr tablet  . However, he hasnt taken the medication since this morning and he is still having the same symptoms. He states he feels very dizzy and loss of balance. He states he feels nauseated and he has a headache.

## 2022-02-01 NOTE — Telephone Encounter (Signed)
Spoke with Joelene Millin at [facility] and patient. Patietn has had headache and dry heaves all day. He contributes it to the dosage of Imdur that was increased yesterday. Patient stated he kept cereal down today. I recommended that he take small sips of fluid to stay hydrated. He stated he is BP last night before PD was 84/57. He took his evening dose of losartan before speaking with me. I spoke with pharmd C. Pavero who decreased Imdur to 60 mg daily and for patient to be seen at the ED regarding dry heaves and BP. Explained this to patient that I agreed with pharmacist for patient to be evaluated at the ED. He verbalized understanding. Joelene Millin thanked me for getting back to them.

## 2022-02-01 NOTE — Patient Outreach (Signed)
  Care Coordination   Follow Up Visit Note   02/01/2022 Name: EDU ON MRN: [id number] DOB: Apr 27, 1951  [name] Kassa is a 71 y.o. year old male who sees [name], [name], MD for primary care. I spoke with  [name] by phone today  What matters to the patients health and wellness today?  Dry heaving believes side effects from new hypertension medicine Had cereal only today  Last BP reading 84/57 before he completed peritoneal dialysis Last seen in office January 07 2022 & 124/68   Goals Addressed   None     SDOH assessments and interventions completed:   Yes   Care Coordination Interventions Activated:  Yes Care Coordination Interventions:   Yes, provided Outreach to his cardiology office Spoke with [facility] who sent an urgent message to the cardiology PA about pt with continued symptoms that were reported this morning. Gave him the [facility]  24 hr nurse  number Discussed his cardiology office on call services availability  Sarah called RN CM  RN CM completed a conference call to pt with Judson Roch to review concerns and recommended interventions Encouraged to sip on fluids Inderal was 120 then on on 01/07/22 changed to 60 mg  02/01/22 change to 90 mg  He has been encouraged to go back to the 60 mg vs 90 mg  Encouraged to use the on call services Education provided for hypotension He was advised to go to ED by cardiology staff after He confirms he just took his Losartan He reports each time he takes Isosorbide he has these symptoms He reports not taking it today   Follow up plan: Follow up call scheduled for further TOC services by assigned [facility] staff C Spinks This RN CM provided an update via e-mail to [place] and encouraged pt to call RN CM or MD office prn. Encouraged to seek EMS prn He voiced understanding  Encounter Outcome:  Pt. Visit Completed  Shemaiah Round L. Lavina Hamman, RN, BSN, [place] Coordinator Office number [phone] [facility]  number [phone] Fax number [phone]

## 2022-02-01 NOTE — Telephone Encounter (Signed)
Was fallling up to see if there was update for patient. Trying to keep patient from going back to the hospital. Ask that we give the patient a call. Please advise

## 2022-02-02 ENCOUNTER — Telehealth: Payer: Self-pay | Admitting: Internal Medicine

## 2022-02-02 DIAGNOSIS — D509 Iron deficiency anemia, unspecified: Secondary | ICD-10-CM | POA: Diagnosis not present

## 2022-02-02 DIAGNOSIS — N186 End stage renal disease: Secondary | ICD-10-CM | POA: Diagnosis not present

## 2022-02-02 DIAGNOSIS — Z992 Dependence on renal dialysis: Secondary | ICD-10-CM | POA: Diagnosis not present

## 2022-02-02 DIAGNOSIS — D631 Anemia in chronic kidney disease: Secondary | ICD-10-CM | POA: Diagnosis not present

## 2022-02-02 DIAGNOSIS — N250 Renal osteodystrophy: Secondary | ICD-10-CM | POA: Diagnosis not present

## 2022-02-02 NOTE — Telephone Encounter (Signed)
  Pt c/o medication issue:  1. Name of Medication: isosorbide mononitrate (IMDUR) 60 MG 24 hr tablet  ranolazine (RANEXA) 500 MG 12 hr tablet  2. How are you currently taking this medication (dosage and times per day)? As written  3. Are you having a reaction (difficulty breathing--STAT)?   4. What is your medication issue? Pt said, he went to his pharmacy and was told they cannot fill his isosorbide 60 mg because it was refilled last month. However he is out of meds. Also, he wanted to ask Dr. Debara Pickett if he needs to continue taking his ranexa

## 2022-02-02 NOTE — Telephone Encounter (Signed)
Ok to stop the Ranexa if he is not having chest pain, since he just had a stent. He had called in yesterday being sick on the high dose of Imdur - I agree with the lower dose of 60 mg if that is tolerated. Could get a new Rx for this. Did he go to the ER as instructed?  Dr. Lemmie Evens

## 2022-02-02 NOTE — Telephone Encounter (Addendum)
Patient stated he did not go to the ED because his BP came down to 127/65 and the dizziness was wearing off. Today, he feels better and has not been having chest pain. Informed patient that he can stop taking Ranexa. He is to call clinic if he does have chest pain. Spoke with pharmacy about patient getting Imdur '60mg'$  daily. Was told her just got a refill on 7/5. A 90-day supply will cost patient $71.89. a 30- day supple is $28.00. Patient said he will pay for a 30-day supply. Pharmacy advised.

## 2022-02-03 DIAGNOSIS — Z992 Dependence on renal dialysis: Secondary | ICD-10-CM | POA: Diagnosis not present

## 2022-02-03 DIAGNOSIS — N250 Renal osteodystrophy: Secondary | ICD-10-CM | POA: Diagnosis not present

## 2022-02-03 DIAGNOSIS — D631 Anemia in chronic kidney disease: Secondary | ICD-10-CM | POA: Diagnosis not present

## 2022-02-03 DIAGNOSIS — D509 Iron deficiency anemia, unspecified: Secondary | ICD-10-CM | POA: Diagnosis not present

## 2022-02-03 DIAGNOSIS — N186 End stage renal disease: Secondary | ICD-10-CM | POA: Diagnosis not present

## 2022-02-04 DIAGNOSIS — N186 End stage renal disease: Secondary | ICD-10-CM | POA: Diagnosis not present

## 2022-02-04 DIAGNOSIS — Z992 Dependence on renal dialysis: Secondary | ICD-10-CM | POA: Diagnosis not present

## 2022-02-04 DIAGNOSIS — N250 Renal osteodystrophy: Secondary | ICD-10-CM | POA: Diagnosis not present

## 2022-02-04 DIAGNOSIS — D631 Anemia in chronic kidney disease: Secondary | ICD-10-CM | POA: Diagnosis not present

## 2022-02-04 DIAGNOSIS — D509 Iron deficiency anemia, unspecified: Secondary | ICD-10-CM | POA: Diagnosis not present

## 2022-02-05 DIAGNOSIS — N186 End stage renal disease: Secondary | ICD-10-CM | POA: Diagnosis not present

## 2022-02-05 DIAGNOSIS — D509 Iron deficiency anemia, unspecified: Secondary | ICD-10-CM | POA: Diagnosis not present

## 2022-02-05 DIAGNOSIS — N250 Renal osteodystrophy: Secondary | ICD-10-CM | POA: Diagnosis not present

## 2022-02-05 DIAGNOSIS — D631 Anemia in chronic kidney disease: Secondary | ICD-10-CM | POA: Diagnosis not present

## 2022-02-05 DIAGNOSIS — Z992 Dependence on renal dialysis: Secondary | ICD-10-CM | POA: Diagnosis not present

## 2022-02-06 DIAGNOSIS — N186 End stage renal disease: Secondary | ICD-10-CM | POA: Diagnosis not present

## 2022-02-06 DIAGNOSIS — N250 Renal osteodystrophy: Secondary | ICD-10-CM | POA: Diagnosis not present

## 2022-02-06 DIAGNOSIS — Z992 Dependence on renal dialysis: Secondary | ICD-10-CM | POA: Diagnosis not present

## 2022-02-06 DIAGNOSIS — D509 Iron deficiency anemia, unspecified: Secondary | ICD-10-CM | POA: Diagnosis not present

## 2022-02-06 DIAGNOSIS — D631 Anemia in chronic kidney disease: Secondary | ICD-10-CM | POA: Diagnosis not present

## 2022-02-07 DIAGNOSIS — D631 Anemia in chronic kidney disease: Secondary | ICD-10-CM | POA: Diagnosis not present

## 2022-02-07 DIAGNOSIS — D509 Iron deficiency anemia, unspecified: Secondary | ICD-10-CM | POA: Diagnosis not present

## 2022-02-07 DIAGNOSIS — N186 End stage renal disease: Secondary | ICD-10-CM | POA: Diagnosis not present

## 2022-02-07 DIAGNOSIS — Z992 Dependence on renal dialysis: Secondary | ICD-10-CM | POA: Diagnosis not present

## 2022-02-07 DIAGNOSIS — N250 Renal osteodystrophy: Secondary | ICD-10-CM | POA: Diagnosis not present

## 2022-02-08 DIAGNOSIS — N186 End stage renal disease: Secondary | ICD-10-CM | POA: Diagnosis not present

## 2022-02-08 DIAGNOSIS — Z992 Dependence on renal dialysis: Secondary | ICD-10-CM | POA: Diagnosis not present

## 2022-02-08 DIAGNOSIS — D631 Anemia in chronic kidney disease: Secondary | ICD-10-CM | POA: Diagnosis not present

## 2022-02-08 DIAGNOSIS — D509 Iron deficiency anemia, unspecified: Secondary | ICD-10-CM | POA: Diagnosis not present

## 2022-02-08 DIAGNOSIS — N250 Renal osteodystrophy: Secondary | ICD-10-CM | POA: Diagnosis not present

## 2022-02-08 NOTE — Progress Notes (Unsigned)
Cardiology Office Note:    Date:  02/09/2022   ID:  [name], [name] 1950-08-21, MRN [id number]  PCP:  Carrolyn Meiers, [place] Cardiologist: Pixie Casino, MD   Reason for visit: Hospital follow-up  History of Present Illness:    [name] [name] is a 71 y.o. male with a hx of hypertension, hyperlipidemia, CAD, IDDM, history of polysubstance abuse with cocaine, end-stage renal disease on peritoneal dialysis and obstructive sleep apnea.    He had recent chest pain and underwent LHC December 26, 2021 with severe single-vessel CAD with sequential 90-95% mid RCA stenosis at the site of angioplasty in January, previously placed RCA stent demonstrated mild to moderate in-stent restenosis in the distal third, there was also 80% distal RPDA lesion, patient underwent successful DES x2 to mid RCA.  It was recommended patient continue dual antiplatelet therapy indefinitely.  Follow-up echocardiogram showed EF 60 to 65%, no regional wall motion abnormality.  Patient did have second-degree AV block, however remained asymptomatic.  Beta-blocker was discontinued during recent visit.  Due to newly diagnosed second-degree AV block, as outpatient 14-day heart monitor was recommended.  Saw Almyra Deforest January 11, 2022 chest pain had improved.  Imdur increased to 60 mg given slight burning sensation chest with exertion.    Patient mid July 5-January 21, 2022 for chest pain.  Imdur increased and Ranexa added.  Echo showed normal EF.  LHC showed Severe early in-stent restenosis in the mid RCA, treated successfully with a 4.0 x 16 mm Synergy DES.    Today, patient comes with his wife.  He states when he has had in-stent restenosis, he feels 8 out of 10 burning chest pain in the central and left chest.  Associated with shortness of breath and nausea.    Post PCI, he has occasional chest tightening with getting up fast or walking.  Prior to the last PCI, he walking 4 laps.  He states after Imdur was  increased to 120 mg and he was started Ranexa, he felt unbalanced, lightheaded & vomiting.  Ranexa was discontinued and Imdur was decreased to 60 mg.  Now he has manageable mild lightheadedness.  He just restarted walking yesterday and was able to do 1 lap.  He has chronic dyspnea on exertion that is stable.  Denies PND, orthopnea and lower extremity edema.  Denies palpitations and syncope.  He plans to return to cardiac rehab.    Past Medical History:  Diagnosis Date   Anemia    Arthritis    Asthma    Bell palsy    CAD (coronary artery disease)    a. 2014 MV: abnl w/ infap ischemia; b. 03/2013 Cath: aneurysmal bleb in the LAD w/ otw nonobs dzs-->Med Rx.   Chronic back pain    Chronic knee pain    a. 09/2015 s/p R TKA.   Chronic pain    Chronic shoulder pain    Chronic sinusitis    COPD (chronic obstructive pulmonary disease) ([place])    Diabetes mellitus without complication ([place])    type II    ESRD on peritoneal dialysis ([place])    on peritoneal dialysis, [facility]   Essential hypertension    GERD (gastroesophageal reflux disease)    Gout    Gout    Hepatomegaly    noted on noncontrast CT 2015   History of hiatal hernia    Hyperlipidemia    Lateral meniscus tear    Obesity    Trun[place]  Obstructive sleep apnea    does not use cpap    On home oxygen therapy    uses 2l when is going somewhere per patient    PUD (peptic ulcer disease)    remote, reports f/u EGD about 8 years ago unremarkable    Reactive airway disease    related to exposure to chemical during 9/11   Sinusitis    Vitamin D deficiency     Past Surgical History:  Procedure Laterality Date   ASAD LT SHOULDER  12/15/2008   left shoulder   AV FISTULA PLACEMENT Left 08/09/2016   Procedure: BRACHIOCEPHALIC ARTERIOVENOUS (AV) FISTULA CREATION LEFT ARM;  Surgeon: [name], MD;  Location: [place];  Service: Vascular;  Laterality: Left;   CAPD INSERTION N/A 10/07/2018   Procedure: LAPAROSCOPIC PERITONEAL  CATHETER PLACEMENT;  Surgeon: Mickeal Skinner, MD;  Location: [facility];  Service: General;  Laterality: N/A;   CATARACT EXTRACTION W/PHACO Left 03/28/2016   Procedure: CATARACT EXTRACTION PHACO AND INTRAOCULAR LENS PLACEMENT LEFT EYE;  Surgeon: [name], MD;  Location: [facility];  Service: Ophthalmology;  Laterality: Left;  CDE: 4.77   CATARACT EXTRACTION W/PHACO Right 04/11/2016   Procedure: CATARACT EXTRACTION PHACO AND INTRAOCULAR LENS PLACEMENT RIGHT EYE; CDE:  4.74;  Surgeon: [name], MD;  Location: [facility];  Service: Ophthalmology;  Laterality: Right;   COLONOSCOPY  10/15/2008   Fields: Rectal polyp obliterated, not retrieved, hemorrhoids, single ascending colon diverticulum near the CV. Next colonoscopy April 2020   COLONOSCOPY N/A 12/25/2014   SLF: 1. Colorectal polyps (2) removed 2. Small internal hemorrhoids 3. the left colon is severely redundant. hyperplastic polyps   CORONARY STENT INTERVENTION N/A 07/25/2021   Procedure: CORONARY STENT INTERVENTION;  Surgeon: Sherren Mocha, MD;  Location: [place] [facility];  Service: Cardiovascular;  Laterality: N/A;   CORONARY STENT INTERVENTION N/A 12/26/2021   Procedure: CORONARY STENT INTERVENTION;  Surgeon: Nelva Bush, MD;  Location: [place] [facility];  Service: Cardiovascular;  Laterality: N/A;   CORONARY STENT INTERVENTION N/A 01/20/2022   Procedure: CORONARY STENT INTERVENTION;  Surgeon: Sherren Mocha, MD;  Location: [place] [facility];  Service: Cardiovascular;  Laterality: N/A;   DOPPLER ECHOCARDIOGRAPHY     ESOPHAGOGASTRODUODENOSCOPY N/A 12/25/2014   SLF: 1. Anemia most likely due to CRI, gastritis, gastric polyps 2. Moderate non-erosive gastriits and mild duodenitis.  3.TWo large gstric polyps removed.    EYE SURGERY  12/22/2010   tear duct probing-[place]   FOREIGN BODY REMOVAL  03/29/2011   Procedure: REMOVAL FOREIGN BODY EXTREMITY;  Surgeon: Arther Abbott, MD;  [facility];  Service: Orthopedics;  Laterality:  Right;  Removal Foreign Body Right Thumb   IR FLUORO GUIDE CV LINE RIGHT  08/06/2018   IR US GUIDE VASC ACCESS RIGHT  08/06/2018   KNEE ARTHROSCOPY  10/16/2007   left   KNEE ARTHROSCOPY WITH LATERAL MENISECTOMY Right 10/14/2015   Procedure: LEFT KNEE ARTHROSCOPY WITH PARTIAL LATERAL MENISECTOMY;  Surgeon: Carole Civil, MD;  [facility];  Service: Orthopedics;  Laterality: Right;   LEFT HEART CATH AND CORONARY ANGIOGRAPHY N/A 07/25/2021   Procedure: LEFT HEART CATH AND CORONARY ANGIOGRAPHY;  Surgeon: Sherren Mocha, MD;  Location: [place] [facility];  Service: Cardiovascular;  Laterality: N/A;   LEFT HEART CATH AND CORONARY ANGIOGRAPHY N/A 12/26/2021   Procedure: LEFT HEART CATH AND CORONARY ANGIOGRAPHY;  Surgeon: Nelva Bush, MD;  Location: [place] [facility];  Service: Cardiovascular;  Laterality: N/A;   LEFT HEART CATH AND CORONARY  ANGIOGRAPHY N/A 01/20/2022   Procedure: LEFT HEART CATH AND CORONARY ANGIOGRAPHY;  Surgeon: Sherren Mocha, MD;  Location: [place] [facility];  Service: Cardiovascular;  Laterality: N/A;   LEFT HEART CATHETERIZATION WITH CORONARY ANGIOGRAM N/A 03/28/2013   Procedure: LEFT HEART CATHETERIZATION WITH CORONARY ANGIOGRAM;  Surgeon: Leonie Man, MD;  Location: [facility];  Service: Cardiovascular;  Laterality: N/A;   NM MYOVIEW LTD     PENILE PROSTHESIS IMPLANT N/A 08/16/2015   Procedure: PENILE PROTHESIS INFLATABLE, three piece, Excisional biopsy of Penile ulcer, Penile molding;  Surgeon: Carolan Clines, MD;  [facility];  Service: Urology;  Laterality: N/A;   PENILE PROSTHESIS IMPLANT N/A 12/24/2017   Procedure: REMOVAL AND  REPLACEMENT  COLOPLAST PENILE PROSTHESIS;  Surgeon: Lucas Mallow, MD;  [facility];  Service: Urology;  Laterality: N/A;   QUADRICEPS TENDON REPAIR  07/21/2011   Procedure: REPAIR QUADRICEP TENDON;  Surgeon: Arther Abbott, MD;  [facility];  Service: Orthopedics;  Laterality: Right;   TOENAIL EXCISION      removed [id number]   UMBILICAL HERNIA REPAIR  07/17/2005   [place]    Current Medications: Current Meds  Medication Sig   albuterol (VENTOLIN HFA) 108 (90 Base) MCG/ACT inhaler Inhale 2 puffs into the lungs every 6 (six) hours as needed for wheezing or shortness of breath.   aspirin EC 81 MG tablet Take 81 mg by mouth daily. Swallow whole.   calcitRIOL (ROCALTROL) 0.25 MCG capsule Take 0.25 mcg by mouth daily.   glucose blood test strip 1 each by Other route 2 (two) times daily. Use as instructed to check blood glucose twice daily   insulin glargine (LANTUS SOLOSTAR) 100 UNIT/ML Solostar Pen Inject 22 Units into the skin at bedtime.   Insulin Pen Needle (DROPLET PEN NEEDLES) 31G X 5 MM MISC Use to inject insulin daily as directed   isosorbide mononitrate (IMDUR) 30 MG 24 hr tablet Take 1 tablet (30 mg total) by mouth daily.   isosorbide mononitrate (IMDUR) 60 MG 24 hr tablet Take 1 tablet (60 mg total) by mouth daily.   losartan (COZAAR) 25 MG tablet Take 1 tablet (25 mg total) by mouth daily.   nitroGLYCERIN (NITROSTAT) 0.4 MG SL tablet Place 1 tablet (0.4 mg total) under the tongue every 5 (five) minutes as needed for chest pain.   pantoprazole (PROTONIX) 40 MG tablet Take 1 tablet (40 mg total) by mouth 2 (two) times daily before a meal.   ranolazine (RANEXA) 500 MG 12 hr tablet Take 1 tablet (500 mg total) by mouth 2 (two) times daily.   sevelamer (RENAGEL) 800 MG tablet Take 2,400 mg by mouth 3 (three) times daily with meals.   simvastatin (ZOCOR) 20 MG tablet Take 20 mg by mouth every morning.   ticagrelor (BRILINTA) 90 MG TABS tablet Take 1 tablet (90 mg total) by mouth 2 (two) times daily.   torsemide (DEMADEX) 100 MG tablet Take 100 mg by mouth daily.   TRADJENTA 5 MG TABS tablet TAKE 1 TABLET EVERY DAY   Vitamin D, Ergocalciferol, (DRISDOL) 1.25 MG (50000 UNIT) CAPS capsule Take 50,000 Units by mouth every 7 (seven) days. Monday     Allergies:   Opana [oxymorphone hcl],  Oxymorphone, and Tramadol   Social History   Socioeconomic History   Marital status: Married    Spouse name: Not on file   Number of children: 2   Years of education: 12th grade   Highest education level: Not on file  Occupational History   Occupation: disabled   Occupation:      Fish farm manager: UNEMPLOYED  Tobacco Use   Smoking status: Former    Packs/day: 1.00    Years: 25.00    Total pack years: 25.00    Types: Cigarettes    Quit date: 03/27/2010    Years since quitting: 11.8   Smokeless tobacco: Never   Tobacco comments:    Quit x 7 years  Vaping Use   Vaping Use: Never used  Substance and Sexual Activity   Alcohol use: Not Currently    Comment: occasionally   Drug use: Yes    Types: Marijuana    Comment: cocaine- last time used- 11/24/2017 , marijuana-    Sexual activity: Yes  Other Topics Concern   Not on file  Social History Narrative   He quit smoking in 2010. 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 He developed pulmonary problems, became disabled because of lower airway disease in 2009.       WATCHES BASKETBALL. HIS TEAM IS [place].   Social Determinants of Health   Financial Resource Strain: Low Risk  (12/29/2021)   Overall Financial Resource Strain (CARDIA)    Difficulty of Paying Living Expenses: Not hard at all  Food Insecurity: No Food Insecurity (12/29/2021)   Hunger Vital Sign    Worried About Running Out of Food in the Last Year: Never true    Ran Out of Food in the Last Year: Never true  Transportation Needs: No Transportation Needs (12/29/2021)   PRAPARE - Hydrologist (Medical): No    Lack of Transportation (Non-Medical): No  Physical Activity: Sufficiently Active (12/29/2021)   Exercise Vital Sign    Days of Exercise per Week: 3 days    Minutes of Exercise per Session: 60 min  Stress: No Stress Concern Present (12/29/2021)   [place]    Feeling of Stress : Not at all  Social Connections: [place] (12/29/2021)   Social Connection and Isolation Panel [NHANES]    Frequency of Communication with Friends and Family: More than three times a week    Frequency of Social Gatherings with Friends and Family: More than three times a week    Attends Religious Services: More than 4 times per year    Active Member of [employer] or Organizations: Yes    Attends Music therapist: More than 4 times per year    Marital Status: Married     Family History: The patient's family history includes Arthritis in an other family member; Asthma in an other family member; Cancer in his mother; Diabetes in his father, mother, and sister; Hypertension in his father, mother, and sister; Lung disease in an other family member. There is no history of Anesthesia problems, Hypotension, Malignant hyperthermia, Pseudochol deficiency, or Colon cancer.  ROS:   Please see the history of present illness.     EKGs/Labs/Other Studies Reviewed:    EKG:  The ekg ordered today demonstrates sinus rhythm with second-degree AV block Mobitz type I with 2-1 AV conduction, left bundle branch block, heart rate 71, QRS duration 128 ms.  Recent Labs: 07/29/2021: B Natriuretic Peptide 154.0; Magnesium 2.4; TSH 3.386 01/18/2022: ALT 13 01/21/2022: BUN 47; Creatinine, Ser 18.48; Hemoglobin 9.6; Platelets 124; Potassium 3.7; Sodium 134   Recent Lipid Panel Lab Results  Component Value Date/Time   CHOL 99 11/23/2021 01:30 PM  CHOL 104 06/10/2021 01:37 PM   TRIG 148 11/23/2021 01:30 PM   HDL 29 (L) 11/23/2021 01:30 PM   HDL 30 (L) 06/10/2021 01:37 PM   LDLCALC 47 11/23/2021 01:30 PM    Physical Exam:    VS:  BP 122/64   Pulse 71   Ht '5\' 4"'$  (1.626 m)   Wt 219 lb 6.4 oz (99.5 kg)   SpO2 100%   BMI 37.66 kg/m    No data found.       Wt Readings from Last 3 Encounters:  02/09/22 219 lb 6.4 oz (99.5 kg)  01/20/22 222 lb  (100.7 kg)  01/11/22 224 lb 12.8 oz (102 kg)     GEN:  Well nourished, well developed in no acute distress HEENT: Normal NECK: No JVD; No carotid bruits CARDIAC: Irregular irregular, no murmurs, rubs, gallops RESPIRATORY:  Clear to auscultation without rales, wheezing or rhonchi  ABDOMEN: Soft, non-tender, non-distended MUSCULOSKELETAL: No edema; No deformity  SKIN: Warm and dry NEUROLOGIC:  Alert and oriented PSYCHIATRIC:  Normal affect     ASSESSMENT AND PLAN   Coronary artery disease with stable angina -recently underwent DES x2 to mid RCA 12/2021 -LHC 01/20/22 showed very early in-stent restenosis of the mid RCA treated with DES. -Did not tolerate combo of Imdur '120mg'$  and Ranexa - had lightheadedness and vomiting.  Ranexa discontinued and Imdur decreased at 60 mg daily. -Continue Imdur 60 mg daily.  If baseline angina worsens, could see if patient could tolerate Imdur 60 mg twice daily. -Continue Brilinta and aspirin indefinite given multiple PCI's procedures -Cleared to restart cardiac rehab.  First-degree AV block Second Degree AV Block-Mobitz I  Third-degree AV block -13-day ZIO monitor 12/27/21 showed normal sinus rhythm, first-degree AV block, second-degree AV block-Mobitz 1, 4 episodes of third-degree AV block lasting a total of 49 seconds. -Toprol XL 25 mg discontinued December 22, 2021 -Has mild lightheadedness (relates to Imdur use), no syncope -Keep EP appointment for episodic complete heart block.  Hypertension, well controlled -Continue current medications. -Goal BP is <130/80.  Recommend DASH diet (high in vegetables, fruits, low-fat dairy products, whole grains, poultry, fish, and nuts and low in sweets, sugar-sweetened beverages, and red meats), salt restriction and increase physical activity.  Hyperlipidemia with goal LDL less than 55 -LDL 47 in May 2023.  Continue simvastatin.  Disposition - Follow-up in August with Dr. Caryl Comes to follow-up heart block.  Follow-up  with Dr. Debara Pickett as scheduled October.   Medication Adjustments/Labs and Tests Ordered: Current medicines are reviewed at length with the patient today.  Concerns regarding medicines are outlined above.  Orders Placed This Encounter  Procedures   EKG 12-Lead   No orders of the defined types were placed in this encounter.   Patient Instructions  Medication Instructions:  No changes *If you need a refill on your cardiac medications before your next appointment, please call your pharmacy*   Lab Work: No Labs If you have labs (blood work) drawn today and your tests are completely normal, you will receive your results only by: [place] (if you have MyChart) OR A paper copy in the mail If you have any lab test that is abnormal or we need to change your treatment, we will call you to review the results.   Testing/Procedures: No Testing   Follow-Up: At [facility], you and your health needs are our priority.  As part of our continuing mission to provide you with exceptional heart care, we have created designated Provider Care  Teams.  These Care Teams include your primary Cardiologist (physician) and Advanced Practice Providers (APPs -  Physician Assistants and Nurse Practitioners) who all work together to provide you with the care you need, when you need it.  We recommend signing up for the patient portal called "MyChart".  Sign up information is provided on this After Visit Summary.  MyChart is used to connect with patients for Virtual Visits (Telemedicine).  Patients are able to view lab/test results, encounter notes, upcoming appointments, etc.  Non-urgent messages can be sent to your provider as well.   To learn more about what you can do with MyChart, go to [email or web address].    Your next appointment:   Keep Scheduled Appointment  The format for your next appointment:   In Person  Provider:   Virl Axe, MD      Important Information About Sugar          Signed, Gaston Islam  02/09/2022 10:06 AM    [place]

## 2022-02-09 ENCOUNTER — Encounter: Payer: Self-pay | Admitting: Physician Assistant

## 2022-02-09 ENCOUNTER — Ambulatory Visit (INDEPENDENT_AMBULATORY_CARE_PROVIDER_SITE_OTHER): Payer: Medicare Other | Admitting: Physician Assistant

## 2022-02-09 VITALS — BP 122/64 | HR 71 | Ht 64.0 in | Wt 219.4 lb

## 2022-02-09 DIAGNOSIS — N250 Renal osteodystrophy: Secondary | ICD-10-CM | POA: Diagnosis not present

## 2022-02-09 DIAGNOSIS — D631 Anemia in chronic kidney disease: Secondary | ICD-10-CM | POA: Diagnosis not present

## 2022-02-09 DIAGNOSIS — I25118 Atherosclerotic heart disease of native coronary artery with other forms of angina pectoris: Secondary | ICD-10-CM | POA: Diagnosis not present

## 2022-02-09 DIAGNOSIS — I441 Atrioventricular block, second degree: Secondary | ICD-10-CM | POA: Diagnosis not present

## 2022-02-09 DIAGNOSIS — D509 Iron deficiency anemia, unspecified: Secondary | ICD-10-CM | POA: Diagnosis not present

## 2022-02-09 DIAGNOSIS — Z992 Dependence on renal dialysis: Secondary | ICD-10-CM | POA: Diagnosis not present

## 2022-02-09 DIAGNOSIS — I10 Essential (primary) hypertension: Secondary | ICD-10-CM | POA: Diagnosis not present

## 2022-02-09 DIAGNOSIS — N186 End stage renal disease: Secondary | ICD-10-CM | POA: Diagnosis not present

## 2022-02-09 NOTE — Patient Instructions (Signed)
Medication Instructions:  No changes *If you need a refill on your cardiac medications before your next appointment, please call your pharmacy*   Lab Work: No Labs If you have labs (blood work) drawn today and your tests are completely normal, you will receive your results only by: [place] (if you have MyChart) OR A paper copy in the mail If you have any lab test that is abnormal or we need to change your treatment, we will call you to review the results.   Testing/Procedures: No Testing   Follow-Up: At [facility], you and your health needs are our priority.  As part of our continuing mission to provide you with exceptional heart care, we have created designated Provider Care Teams.  These Care Teams include your primary Cardiologist (physician) and Advanced Practice Providers (APPs -  Physician Assistants and Nurse Practitioners) who all work together to provide you with the care you need, when you need it.  We recommend signing up for the patient portal called "MyChart".  Sign up information is provided on this After Visit Summary.  MyChart is used to connect with patients for Virtual Visits (Telemedicine).  Patients are able to view lab/test results, encounter notes, upcoming appointments, etc.  Non-urgent messages can be sent to your provider as well.   To learn more about what you can do with MyChart, go to [email or web address].    Your next appointment:   Keep Scheduled Appointment  The format for your next appointment:   In Person  Provider:   Virl Axe, MD      Important Information About Sugar

## 2022-02-10 DIAGNOSIS — D509 Iron deficiency anemia, unspecified: Secondary | ICD-10-CM | POA: Diagnosis not present

## 2022-02-10 DIAGNOSIS — N186 End stage renal disease: Secondary | ICD-10-CM | POA: Diagnosis not present

## 2022-02-10 DIAGNOSIS — D631 Anemia in chronic kidney disease: Secondary | ICD-10-CM | POA: Diagnosis not present

## 2022-02-10 DIAGNOSIS — Z992 Dependence on renal dialysis: Secondary | ICD-10-CM | POA: Diagnosis not present

## 2022-02-10 DIAGNOSIS — N250 Renal osteodystrophy: Secondary | ICD-10-CM | POA: Diagnosis not present

## 2022-02-11 DIAGNOSIS — N186 End stage renal disease: Secondary | ICD-10-CM | POA: Diagnosis not present

## 2022-02-11 DIAGNOSIS — D631 Anemia in chronic kidney disease: Secondary | ICD-10-CM | POA: Diagnosis not present

## 2022-02-11 DIAGNOSIS — Z992 Dependence on renal dialysis: Secondary | ICD-10-CM | POA: Diagnosis not present

## 2022-02-11 DIAGNOSIS — D509 Iron deficiency anemia, unspecified: Secondary | ICD-10-CM | POA: Diagnosis not present

## 2022-02-11 DIAGNOSIS — N250 Renal osteodystrophy: Secondary | ICD-10-CM | POA: Diagnosis not present

## 2022-02-12 DIAGNOSIS — N250 Renal osteodystrophy: Secondary | ICD-10-CM | POA: Diagnosis not present

## 2022-02-12 DIAGNOSIS — N186 End stage renal disease: Secondary | ICD-10-CM | POA: Diagnosis not present

## 2022-02-12 DIAGNOSIS — Z992 Dependence on renal dialysis: Secondary | ICD-10-CM | POA: Diagnosis not present

## 2022-02-12 DIAGNOSIS — D509 Iron deficiency anemia, unspecified: Secondary | ICD-10-CM | POA: Diagnosis not present

## 2022-02-12 DIAGNOSIS — D631 Anemia in chronic kidney disease: Secondary | ICD-10-CM | POA: Diagnosis not present

## 2022-02-13 DIAGNOSIS — N186 End stage renal disease: Secondary | ICD-10-CM | POA: Diagnosis not present

## 2022-02-13 DIAGNOSIS — D631 Anemia in chronic kidney disease: Secondary | ICD-10-CM | POA: Diagnosis not present

## 2022-02-13 DIAGNOSIS — D509 Iron deficiency anemia, unspecified: Secondary | ICD-10-CM | POA: Diagnosis not present

## 2022-02-13 DIAGNOSIS — Z992 Dependence on renal dialysis: Secondary | ICD-10-CM | POA: Diagnosis not present

## 2022-02-13 DIAGNOSIS — N250 Renal osteodystrophy: Secondary | ICD-10-CM | POA: Diagnosis not present

## 2022-02-14 DIAGNOSIS — D509 Iron deficiency anemia, unspecified: Secondary | ICD-10-CM | POA: Diagnosis not present

## 2022-02-14 DIAGNOSIS — Z992 Dependence on renal dialysis: Secondary | ICD-10-CM | POA: Diagnosis not present

## 2022-02-14 DIAGNOSIS — D631 Anemia in chronic kidney disease: Secondary | ICD-10-CM | POA: Diagnosis not present

## 2022-02-14 DIAGNOSIS — N186 End stage renal disease: Secondary | ICD-10-CM | POA: Diagnosis not present

## 2022-02-14 DIAGNOSIS — N250 Renal osteodystrophy: Secondary | ICD-10-CM | POA: Diagnosis not present

## 2022-02-15 ENCOUNTER — Encounter: Payer: Self-pay | Admitting: *Deleted

## 2022-02-15 DIAGNOSIS — N250 Renal osteodystrophy: Secondary | ICD-10-CM | POA: Diagnosis not present

## 2022-02-15 DIAGNOSIS — Z992 Dependence on renal dialysis: Secondary | ICD-10-CM | POA: Diagnosis not present

## 2022-02-15 DIAGNOSIS — D631 Anemia in chronic kidney disease: Secondary | ICD-10-CM | POA: Diagnosis not present

## 2022-02-15 DIAGNOSIS — N186 End stage renal disease: Secondary | ICD-10-CM | POA: Diagnosis not present

## 2022-02-15 DIAGNOSIS — D509 Iron deficiency anemia, unspecified: Secondary | ICD-10-CM | POA: Diagnosis not present

## 2022-02-15 NOTE — Telephone Encounter (Signed)
This encounter was created in error - please disregard.

## 2022-02-16 ENCOUNTER — Encounter: Payer: Self-pay | Admitting: *Deleted

## 2022-02-16 DIAGNOSIS — Z992 Dependence on renal dialysis: Secondary | ICD-10-CM | POA: Diagnosis not present

## 2022-02-16 DIAGNOSIS — N250 Renal osteodystrophy: Secondary | ICD-10-CM | POA: Diagnosis not present

## 2022-02-16 DIAGNOSIS — D631 Anemia in chronic kidney disease: Secondary | ICD-10-CM | POA: Diagnosis not present

## 2022-02-16 DIAGNOSIS — N186 End stage renal disease: Secondary | ICD-10-CM | POA: Diagnosis not present

## 2022-02-16 DIAGNOSIS — D509 Iron deficiency anemia, unspecified: Secondary | ICD-10-CM | POA: Diagnosis not present

## 2022-02-17 ENCOUNTER — Other Ambulatory Visit: Payer: Self-pay | Admitting: *Deleted

## 2022-02-17 DIAGNOSIS — N250 Renal osteodystrophy: Secondary | ICD-10-CM | POA: Diagnosis not present

## 2022-02-17 DIAGNOSIS — Z992 Dependence on renal dialysis: Secondary | ICD-10-CM | POA: Diagnosis not present

## 2022-02-17 DIAGNOSIS — D631 Anemia in chronic kidney disease: Secondary | ICD-10-CM | POA: Diagnosis not present

## 2022-02-17 DIAGNOSIS — D509 Iron deficiency anemia, unspecified: Secondary | ICD-10-CM | POA: Diagnosis not present

## 2022-02-17 DIAGNOSIS — N186 End stage renal disease: Secondary | ICD-10-CM | POA: Diagnosis not present

## 2022-02-17 NOTE — Patient Outreach (Signed)
  Care Coordination   Follow Up Visit Note   02/17/2022 Name: [name] [name] MRN: [id number] DOB: 12/12/1950  [name] [name] is a 71 y.o. year old male who sees [name] [name], [name] Baxter, MD for primary care. I spoke with  [name] [name] by phone today  What matters to the patients health and wellness today?  Weight Loss    Goals Addressed               This Visit's Progress     Patient Stated     I need to lose wt to be eligible for possible kidney transplant. Goal is 195#, current wt 219#, has already lost 16# in last 3 months. (pt-stated)        Discussed current diet. Does follow ADA recommendations, limits carbs. Currently exercises by walking most days. Glucose is well managed, last HgbA1C was 6.2. Discussed goal of losing 5# a month to goal wt of 195#. Pt is agreeable and has demonstrated that he can do this.        SDOH assessments and interventions completed:  Yes   Care Coordination Interventions Activated:  Yes  Care Coordination Interventions:  Yes, provided   Follow up plan: Follow up call scheduled for September 7th    Encounter Outcome:  Pt. Visit Completed   [name] [name]. [name] Neither, MSN, [facility] Gerontological Nurse Practitioner [facility] Care Management [phone]

## 2022-02-18 DIAGNOSIS — Z992 Dependence on renal dialysis: Secondary | ICD-10-CM | POA: Diagnosis not present

## 2022-02-18 DIAGNOSIS — D631 Anemia in chronic kidney disease: Secondary | ICD-10-CM | POA: Diagnosis not present

## 2022-02-18 DIAGNOSIS — N250 Renal osteodystrophy: Secondary | ICD-10-CM | POA: Diagnosis not present

## 2022-02-18 DIAGNOSIS — N186 End stage renal disease: Secondary | ICD-10-CM | POA: Diagnosis not present

## 2022-02-18 DIAGNOSIS — D509 Iron deficiency anemia, unspecified: Secondary | ICD-10-CM | POA: Diagnosis not present

## 2022-02-19 DIAGNOSIS — N250 Renal osteodystrophy: Secondary | ICD-10-CM | POA: Diagnosis not present

## 2022-02-19 DIAGNOSIS — D509 Iron deficiency anemia, unspecified: Secondary | ICD-10-CM | POA: Diagnosis not present

## 2022-02-19 DIAGNOSIS — D631 Anemia in chronic kidney disease: Secondary | ICD-10-CM | POA: Diagnosis not present

## 2022-02-19 DIAGNOSIS — Z992 Dependence on renal dialysis: Secondary | ICD-10-CM | POA: Diagnosis not present

## 2022-02-19 DIAGNOSIS — N186 End stage renal disease: Secondary | ICD-10-CM | POA: Diagnosis not present

## 2022-02-20 DIAGNOSIS — D509 Iron deficiency anemia, unspecified: Secondary | ICD-10-CM | POA: Diagnosis not present

## 2022-02-20 DIAGNOSIS — N186 End stage renal disease: Secondary | ICD-10-CM | POA: Diagnosis not present

## 2022-02-20 DIAGNOSIS — D631 Anemia in chronic kidney disease: Secondary | ICD-10-CM | POA: Diagnosis not present

## 2022-02-20 DIAGNOSIS — Z992 Dependence on renal dialysis: Secondary | ICD-10-CM | POA: Diagnosis not present

## 2022-02-20 DIAGNOSIS — N250 Renal osteodystrophy: Secondary | ICD-10-CM | POA: Diagnosis not present

## 2022-02-21 DIAGNOSIS — N250 Renal osteodystrophy: Secondary | ICD-10-CM | POA: Diagnosis not present

## 2022-02-21 DIAGNOSIS — D631 Anemia in chronic kidney disease: Secondary | ICD-10-CM | POA: Diagnosis not present

## 2022-02-21 DIAGNOSIS — Z992 Dependence on renal dialysis: Secondary | ICD-10-CM | POA: Diagnosis not present

## 2022-02-21 DIAGNOSIS — D509 Iron deficiency anemia, unspecified: Secondary | ICD-10-CM | POA: Diagnosis not present

## 2022-02-21 DIAGNOSIS — N186 End stage renal disease: Secondary | ICD-10-CM | POA: Diagnosis not present

## 2022-02-22 DIAGNOSIS — N250 Renal osteodystrophy: Secondary | ICD-10-CM | POA: Diagnosis not present

## 2022-02-22 DIAGNOSIS — N186 End stage renal disease: Secondary | ICD-10-CM | POA: Diagnosis not present

## 2022-02-22 DIAGNOSIS — D509 Iron deficiency anemia, unspecified: Secondary | ICD-10-CM | POA: Diagnosis not present

## 2022-02-22 DIAGNOSIS — Z992 Dependence on renal dialysis: Secondary | ICD-10-CM | POA: Diagnosis not present

## 2022-02-22 DIAGNOSIS — D631 Anemia in chronic kidney disease: Secondary | ICD-10-CM | POA: Diagnosis not present

## 2022-02-23 DIAGNOSIS — D631 Anemia in chronic kidney disease: Secondary | ICD-10-CM | POA: Diagnosis not present

## 2022-02-23 DIAGNOSIS — D509 Iron deficiency anemia, unspecified: Secondary | ICD-10-CM | POA: Diagnosis not present

## 2022-02-23 DIAGNOSIS — N250 Renal osteodystrophy: Secondary | ICD-10-CM | POA: Diagnosis not present

## 2022-02-23 DIAGNOSIS — Z992 Dependence on renal dialysis: Secondary | ICD-10-CM | POA: Diagnosis not present

## 2022-02-23 DIAGNOSIS — N186 End stage renal disease: Secondary | ICD-10-CM | POA: Diagnosis not present

## 2022-02-24 DIAGNOSIS — D631 Anemia in chronic kidney disease: Secondary | ICD-10-CM | POA: Diagnosis not present

## 2022-02-24 DIAGNOSIS — N186 End stage renal disease: Secondary | ICD-10-CM | POA: Diagnosis not present

## 2022-02-24 DIAGNOSIS — N250 Renal osteodystrophy: Secondary | ICD-10-CM | POA: Diagnosis not present

## 2022-02-24 DIAGNOSIS — Z992 Dependence on renal dialysis: Secondary | ICD-10-CM | POA: Diagnosis not present

## 2022-02-24 DIAGNOSIS — D509 Iron deficiency anemia, unspecified: Secondary | ICD-10-CM | POA: Diagnosis not present

## 2022-02-25 DIAGNOSIS — N186 End stage renal disease: Secondary | ICD-10-CM | POA: Diagnosis not present

## 2022-02-25 DIAGNOSIS — D509 Iron deficiency anemia, unspecified: Secondary | ICD-10-CM | POA: Diagnosis not present

## 2022-02-25 DIAGNOSIS — D631 Anemia in chronic kidney disease: Secondary | ICD-10-CM | POA: Diagnosis not present

## 2022-02-25 DIAGNOSIS — N250 Renal osteodystrophy: Secondary | ICD-10-CM | POA: Diagnosis not present

## 2022-02-25 DIAGNOSIS — Z992 Dependence on renal dialysis: Secondary | ICD-10-CM | POA: Diagnosis not present

## 2022-02-26 DIAGNOSIS — Z992 Dependence on renal dialysis: Secondary | ICD-10-CM | POA: Diagnosis not present

## 2022-02-26 DIAGNOSIS — N186 End stage renal disease: Secondary | ICD-10-CM | POA: Diagnosis not present

## 2022-02-26 DIAGNOSIS — N250 Renal osteodystrophy: Secondary | ICD-10-CM | POA: Diagnosis not present

## 2022-02-26 DIAGNOSIS — D631 Anemia in chronic kidney disease: Secondary | ICD-10-CM | POA: Diagnosis not present

## 2022-02-26 DIAGNOSIS — D509 Iron deficiency anemia, unspecified: Secondary | ICD-10-CM | POA: Diagnosis not present

## 2022-02-27 ENCOUNTER — Ambulatory Visit (INDEPENDENT_AMBULATORY_CARE_PROVIDER_SITE_OTHER): Payer: Medicare Other | Admitting: Internal Medicine

## 2022-02-27 ENCOUNTER — Encounter: Payer: Self-pay | Admitting: Internal Medicine

## 2022-02-27 VITALS — BP 98/66 | HR 63 | Ht 64.0 in | Wt 219.2 lb

## 2022-02-27 DIAGNOSIS — N250 Renal osteodystrophy: Secondary | ICD-10-CM | POA: Diagnosis not present

## 2022-02-27 DIAGNOSIS — D509 Iron deficiency anemia, unspecified: Secondary | ICD-10-CM | POA: Diagnosis not present

## 2022-02-27 DIAGNOSIS — Z992 Dependence on renal dialysis: Secondary | ICD-10-CM | POA: Diagnosis not present

## 2022-02-27 DIAGNOSIS — D631 Anemia in chronic kidney disease: Secondary | ICD-10-CM | POA: Diagnosis not present

## 2022-02-27 DIAGNOSIS — N186 End stage renal disease: Secondary | ICD-10-CM | POA: Diagnosis not present

## 2022-02-27 DIAGNOSIS — I441 Atrioventricular block, second degree: Secondary | ICD-10-CM

## 2022-02-27 NOTE — Patient Instructions (Signed)
Medication Instructions:  Your physician recommends that you continue on your current medications as directed. Please refer to the Current Medication list given to you today.  *If you need a refill on your cardiac medications before your next appointment, please call your pharmacy*   Lab Work: None ordered.  If you have labs (blood work) drawn today and your tests are completely normal, you will receive your results only by: MyChart Message (if you have MyChart) OR A paper copy in the mail If you have any lab test that is abnormal or we need to change your treatment, we will call you to review the results.   Testing/Procedures: None ordered.    Follow-Up: At [facility], you and your health needs are our priority.  As part of our continuing mission to provide you with exceptional heart care, we have created designated Provider Care Teams.  These Care Teams include your primary Cardiologist (physician) and Advanced Practice Providers (APPs -  Physician Assistants and Nurse Practitioners) who all work together to provide you with the care you need, when you need it.  We recommend signing up for the patient portal called "MyChart".  Sign up information is provided on this After Visit Summary.  MyChart is used to connect with patients for Virtual Visits (Telemedicine).  Patients are able to view lab/test results, encounter notes, upcoming appointments, etc.  Non-urgent messages can be sent to your provider as well.   To learn more about what you can do with MyChart, go to [email or web address].    Your next appointment:   Follow up with Dr Klein as needed  Important Information About Sugar       

## 2022-02-27 NOTE — Progress Notes (Signed)
ELECTROPHYSIOLOGY CONSULT NOTE  Patient ID: [name] [name], MRN: [id number], DOB/AGE: 02/24/1951 71 y.o. Admit date: (Not on file) Date of Consult: 02/27/2022  Primary Physician: Carrolyn Meiers, MD Primary Cardiologist: [facility]     [name] [name] is a 71 y.o. male who is being seen today for the evaluation of bradycardia and heart block at the request of [facility].    HPI  [name] is a 70 y.o. male with coronary artery disease and prior stenting, end-stage renal disease on home peritoneal dialysis, now for 4 years, obstructive sleep apnea with variable compliance, noted to have bradycardia with second-degree AV block type I first-degree AV block and electrocardiograms after which he was submitted for a event recorder demonstrating second-degree AV block 2: 1 and nocturnal complete heart block  No history of syncope.  Chronic dyspnea on exertion which improved somewhat with his most recent stent.  This also alleviated his chest discomfort.  Significant obesity    DATE TEST EF   6/23 Echo     % No LVH  7/23 Echo   65 %   7/23 LHC  RCA ISR > stent LAD/LCx stable non  obstructive   Date Cr K Hgb  7/23 18 3.7 9.6             Past Medical History:  Diagnosis Date   Anemia    Arthritis    Asthma    Bell palsy    CAD (coronary artery disease)    a. 2014 MV: abnl w/ infap ischemia; b. 03/2013 Cath: aneurysmal bleb in the LAD w/ otw nonobs dzs-->Med Rx.   Chronic back pain    Chronic knee pain    a. 09/2015 s/p R TKA.   Chronic pain    Chronic shoulder pain    Chronic sinusitis    COPD (chronic obstructive pulmonary disease) ([place])    Diabetes mellitus without complication ([place])    type II    ESRD on peritoneal dialysis ([place])    on peritoneal dialysis, [facility]   Essential hypertension    GERD (gastroesophageal reflux disease)    Gout    Gout    Hepatomegaly    noted on noncontrast CT 2015   History of hiatal hernia    Hyperlipidemia     Lateral meniscus tear    Obesity    Truncal   Obstructive sleep apnea    does not use cpap    On home oxygen therapy    uses 2l when is going somewhere per patient    PUD (peptic ulcer disease)    remote, reports f/u EGD about 8 years ago unremarkable    Reactive airway disease    related to exposure to chemical during 9/11   Sinusitis    Vitamin D deficiency       Surgical History:  Past Surgical History:  Procedure Laterality Date   ASAD LT SHOULDER  12/15/2008   left shoulder   AV FISTULA PLACEMENT Left 08/09/2016   Procedure: BRACHIOCEPHALIC ARTERIOVENOUS (AV) FISTULA CREATION LEFT ARM;  Surgeon: [name], MD;  Location: [place];  Service: Vascular;  Laterality: Left;   CAPD INSERTION N/A 10/07/2018   Procedure: LAPAROSCOPIC PERITONEAL CATHETER PLACEMENT;  Surgeon: Mickeal Skinner, MD;  [facility];  Service: General;  Laterality: N/A;   CATARACT EXTRACTION W/PHACO Left 03/28/2016   Procedure: CATARACT EXTRACTION PHACO AND INTRAOCULAR LENS PLACEMENT LEFT EYE;  Surgeon: [name], MD;  Location: [facility];  Service: Ophthalmology;  Laterality: Left;  CDE: 4.77   CATARACT EXTRACTION W/PHACO Right 04/11/2016   Procedure: CATARACT EXTRACTION PHACO AND INTRAOCULAR LENS PLACEMENT RIGHT EYE; CDE:  4.74;  Surgeon: [name], MD;  [facility]: [facility];  Service: Ophthalmology;  Laterality: Right;   COLONOSCOPY  10/15/2008   Fields: Rectal polyp obliterated, not retrieved, hemorrhoids, single ascending colon diverticulum near the CV. Next colonoscopy April 2020   COLONOSCOPY N/A 12/25/2014   SLF: 1. Colorectal polyps (2) removed 2. Small internal hemorrhoids 3. the left colon is severely redundant. hyperplastic polyps   CORONARY STENT INTERVENTION N/A 07/25/2021   Procedure: CORONARY STENT INTERVENTION;  Surgeon: Sherren Mocha, MD;  Location: [place] [facility];  Service: Cardiovascular;  Laterality: N/A;   CORONARY STENT INTERVENTION N/A 12/26/2021   Procedure: CORONARY  STENT INTERVENTION;  Surgeon: Nelva Bush, MD;  Location: [place] [facility];  Service: Cardiovascular;  Laterality: N/A;   CORONARY STENT INTERVENTION N/A 01/20/2022   Procedure: CORONARY STENT INTERVENTION;  Surgeon: Sherren Mocha, MD;  Location: [place] [facility];  Service: Cardiovascular;  Laterality: N/A;   DOPPLER ECHOCARDIOGRAPHY     ESOPHAGOGASTRODUODENOSCOPY N/A 12/25/2014   SLF: 1. Anemia most likely due to CRI, gastritis, gastric polyps 2. Moderate non-erosive gastriits and mild duodenitis.  3.TWo large gstric polyps removed.    EYE SURGERY  12/22/2010   tear duct probing-[place]   FOREIGN BODY REMOVAL  03/29/2011   Procedure: REMOVAL FOREIGN BODY EXTREMITY;  Surgeon: Arther Abbott, MD;  [facility];  Service: Orthopedics;  Laterality: Right;  Removal Foreign Body Right Thumb   IR FLUORO GUIDE CV LINE RIGHT  08/06/2018   IR US GUIDE VASC ACCESS RIGHT  08/06/2018   KNEE ARTHROSCOPY  10/16/2007   left   KNEE ARTHROSCOPY WITH LATERAL MENISECTOMY Right 10/14/2015   Procedure: LEFT KNEE ARTHROSCOPY WITH PARTIAL LATERAL MENISECTOMY;  Surgeon: Carole Civil, MD;  [facility];  Service: Orthopedics;  Laterality: Right;   LEFT HEART CATH AND CORONARY ANGIOGRAPHY N/A 07/25/2021   Procedure: LEFT HEART CATH AND CORONARY ANGIOGRAPHY;  Surgeon: Sherren Mocha, MD;  Location: [place] [facility];  Service: Cardiovascular;  Laterality: N/A;   LEFT HEART CATH AND CORONARY ANGIOGRAPHY N/A 12/26/2021   Procedure: LEFT HEART CATH AND CORONARY ANGIOGRAPHY;  Surgeon: Nelva Bush, MD;  Location: [place] [facility];  Service: Cardiovascular;  Laterality: N/A;   LEFT HEART CATH AND CORONARY ANGIOGRAPHY N/A 01/20/2022   Procedure: LEFT HEART CATH AND CORONARY ANGIOGRAPHY;  Surgeon: Sherren Mocha, MD;  Location: [place] [facility];  Service: Cardiovascular;  Laterality: N/A;   LEFT HEART CATHETERIZATION WITH CORONARY ANGIOGRAM N/A 03/28/2013   Procedure: LEFT HEART CATHETERIZATION  WITH CORONARY ANGIOGRAM;  Surgeon: Leonie Man, MD;  Location: [facility] C[facility];  Service: Cardiovascular;  Laterality: N/A;   NM MYOVIEW LTD     PENILE PROSTHESIS IMPLANT N/A 08/16/2015   Procedure: PENILE PROTHESIS INFLATABLE, three piece, Excisional biopsy of Penile ulcer, Penile molding;  Surgeon: Carolan Clines, MD;  [facility];  Service: Urology;  Laterality: N/A;   PENILE PROSTHESIS IMPLANT N/A 12/24/2017   Procedure: REMOVAL AND  REPLACEMENT  COLOPLAST PENILE PROSTHESIS;  Surgeon: Lucas Mallow, MD;  [facility];  Service: Urology;  Laterality: N/A;   QUADRICEPS TENDON REPAIR  07/21/2011   Procedure: REPAIR QUADRICEP TENDON;  Surgeon: Arther Abbott, MD;  [facility];  Service: Orthopedics;  Laterality: Right;   TOENAIL EXCISION     removed [id number]   UMBILICAL  HERNIA REPAIR  07/17/2005   [place]     Home Meds: Current Meds  Medication Sig   albuterol (VENTOLIN HFA) 108 (90 Base) MCG/ACT inhaler Inhale 2 puffs into the lungs every 6 (six) hours as needed for wheezing or shortness of breath.   aspirin EC 81 MG tablet Take 81 mg by mouth daily. Swallow whole.   calcitRIOL (ROCALTROL) 0.25 MCG capsule Take 0.25 mcg by mouth daily.   glucose blood test strip 1 each by Other route 2 (two) times daily. Use as instructed to check blood glucose twice daily   insulin glargine (LANTUS SOLOSTAR) 100 UNIT/ML Solostar Pen Inject 22 Units into the skin at bedtime.   Insulin Pen Needle (DROPLET PEN NEEDLES) 31G X 5 MM MISC Use to inject insulin daily as directed   isosorbide mononitrate (IMDUR) 60 MG 24 hr tablet Take 1 tablet (60 mg total) by mouth daily.   nitroGLYCERIN (NITROSTAT) 0.4 MG SL tablet Place 1 tablet (0.4 mg total) under the tongue every 5 (five) minutes as needed for chest pain.   pantoprazole (PROTONIX) 40 MG tablet Take 1 tablet (40 mg total) by mouth 2 (two) times daily before a meal.   sevelamer (RENAGEL) 800 MG tablet Take 2,400 mg by mouth 3  (three) times daily with meals.   simvastatin (ZOCOR) 20 MG tablet Take 20 mg by mouth every morning.   ticagrelor (BRILINTA) 90 MG TABS tablet Take 1 tablet (90 mg total) by mouth 2 (two) times daily.   torsemide (DEMADEX) 100 MG tablet Take 100 mg by mouth daily.   TRADJENTA 5 MG TABS tablet TAKE 1 TABLET EVERY DAY   Vitamin D, Ergocalciferol, (DRISDOL) 1.25 MG (50000 UNIT) CAPS capsule Take 50,000 Units by mouth every 7 (seven) days. Monday    Allergies:  Allergies  Allergen Reactions   Opana [Oxymorphone Hcl] Itching   Oxymorphone Itching   Tramadol Itching    Social History   Socioeconomic History   Marital status: Married    Spouse name: Not on file   Number of children: 2   Years of education: 12th grade   Highest education level: Not on file  Occupational History   Occupation: disabled   Occupation:      Fish farm manager: UNEMPLOYED  Tobacco Use   Smoking status: Former    Packs/day: 1.00    Years: 25.00    Total pack years: 25.00    Types: Cigarettes    Quit date: 03/27/2010    Years since quitting: 11.9   Smokeless tobacco: Never   Tobacco comments:    Quit x 7 years  Vaping Use   Vaping Use: Never used  Substance and Sexual Activity   Alcohol use: Not Currently    Comment: occasionally   Drug use: Yes    Types: Marijuana    Comment: cocaine- last time used- 11/24/2017 , marijuana-    Sexual activity: Yes  Other Topics Concern   Not on file  Social History Narrative   He quit smoking in 2010. 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 He developed pulmonary problems, became disabled because of lower airway disease in 2009.       WATCHES BASKETBALL. HIS TEAM IS [place].   Social Determinants of Health   Financial Resource Strain: Low Risk  (12/29/2021)   Overall Financial Resource Strain (CARDIA)    Difficulty of Paying Living Expenses: Not hard at all  Food Insecurity: No Food Insecurity (02/01/2022)   Hunger  Vital Sign     Worried About Charity fundraiser in the Last Year: Never true    Ran Out of Food in the Last Year: Never true  Transportation Needs: No Transportation Needs (02/17/2022)   PRAPARE - Hydrologist (Medical): No    Lack of Transportation (Non-Medical): No  Physical Activity: Sufficiently Active (12/29/2021)   Exercise Vital Sign    Days of Exercise per Week: 3 days    Minutes of Exercise per Session: 60 min  Stress: No Stress Concern Present (12/29/2021)   [place]    Feeling of Stress : Not at all  Social Connections: [place] (12/29/2021)   Social Connection and Isolation Panel [NHANES]    Frequency of Communication with Friends and Family: More than three times a week    Frequency of Social Gatherings with Friends and Family: More than three times a week    Attends Religious Services: More than 4 times per year    Active Member of [employer] or Organizations: Yes    Attends Music therapist: More than 4 times per year    Marital Status: Married  Human resources officer Violence: Not At Risk (12/29/2021)   Humiliation, Afraid, Rape, and Kick questionnaire    Fear of Current or Ex-Partner: No    Emotionally Abused: No    Physically Abused: No    Sexually Abused: No     Family History  Problem Relation Age of Onset   Hypertension Mother        MI   Cancer Mother        breast    Diabetes Mother    Diabetes Father    Hypertension Father    Hypertension Sister    Diabetes Sister    Arthritis Other    Asthma Other    Lung disease Other    Anesthesia problems Neg Hx    Hypotension Neg Hx    Malignant hyperthermia Neg Hx    Pseudochol deficiency Neg Hx    Colon cancer Neg Hx      ROS:  Please see the history of present illness.     All other systems reviewed and negative.    Physical Exam: Blood pressure 98/66, pulse 63, height '5\' 4"'$  (1.626 m), weight 219 lb 3.2 oz  (99.4 kg), SpO2 96 %. General: Well developed, Morbidly obese  male in no acute distress. Head: Normocephalic, atraumatic, sclera non-icteric, no xanthomas, nares are without discharge. EENT: normal  Lymph Nodes:  none Neck: Negative for carotid bruits.   Back:without scoliosis kyphosis  Lungs: Clear bilaterally to auscultation without wheezes, rales, or rhonchi. Breathing is unlabored. Heart: RRR with S1 S2. No  murmur . No rubs, or gallops appreciated. Abdomen: Soft, non-tender, non-distended with normoactive bowel sounds. No hepatomegaly. No rebound/guarding. No obvious abdominal masses. Msk:  Strength and tone appear normal for age. Extremities: No clubbing or cyanosis. No edema.  Distal pedal pulses are 2+ and equal bilaterally. Skin: Warm and Dry Neuro: Alert and oriented X 3. CN III-XII intact Grossly normal sensory and motor function . Psych:  Responds to questions appropriately with a normal affect.        EKG: Sinus at 80 with Mobitz 1 second-degree  AV block.  We have lead placement error on this tracing, tracing was reviewed from 7/23 with a QRS duration of 128 ms and a left bundle branch like pattern  Recorder was reviewed with  the patient.  Reasonable heart rate excursion with mean daytime heart rates in the 80s and 90s and adequate maximum rate  Assessment and Plan:  Ischemic heart disease with prior stenting and normal LV function  End-stage renal disease on peritoneal dialysis   AV conduction disease with first-degree AV block, second-degree AV block type I, 2: 1 block and nocturnal complete heart block  Short of sleep apnea with variable compliance  Obesity     The patient has AV conduction system disease which is not clearly contributing to any symptoms.  There is been no syncope.  The complete heart block is nocturnal.  Likely related to his variable compliance with his sleep apnea.  The daytime bradycardia seems at rest as his heart rate excursion on his  monitor is normal.  At this juncture there is no indication for pacing.  He is encouraged to lose some weight.  If he has syncope he is to let us know.   Virl Axe

## 2022-02-28 DIAGNOSIS — D631 Anemia in chronic kidney disease: Secondary | ICD-10-CM | POA: Diagnosis not present

## 2022-02-28 DIAGNOSIS — B351 Tinea unguium: Secondary | ICD-10-CM | POA: Diagnosis not present

## 2022-02-28 DIAGNOSIS — N186 End stage renal disease: Secondary | ICD-10-CM | POA: Diagnosis not present

## 2022-02-28 DIAGNOSIS — Z992 Dependence on renal dialysis: Secondary | ICD-10-CM | POA: Diagnosis not present

## 2022-02-28 DIAGNOSIS — D509 Iron deficiency anemia, unspecified: Secondary | ICD-10-CM | POA: Diagnosis not present

## 2022-02-28 DIAGNOSIS — N250 Renal osteodystrophy: Secondary | ICD-10-CM | POA: Diagnosis not present

## 2022-02-28 DIAGNOSIS — E1142 Type 2 diabetes mellitus with diabetic polyneuropathy: Secondary | ICD-10-CM | POA: Diagnosis not present

## 2022-03-01 DIAGNOSIS — Z992 Dependence on renal dialysis: Secondary | ICD-10-CM | POA: Diagnosis not present

## 2022-03-01 DIAGNOSIS — N250 Renal osteodystrophy: Secondary | ICD-10-CM | POA: Diagnosis not present

## 2022-03-01 DIAGNOSIS — D631 Anemia in chronic kidney disease: Secondary | ICD-10-CM | POA: Diagnosis not present

## 2022-03-01 DIAGNOSIS — D509 Iron deficiency anemia, unspecified: Secondary | ICD-10-CM | POA: Diagnosis not present

## 2022-03-01 DIAGNOSIS — N186 End stage renal disease: Secondary | ICD-10-CM | POA: Diagnosis not present

## 2022-03-02 DIAGNOSIS — D509 Iron deficiency anemia, unspecified: Secondary | ICD-10-CM | POA: Diagnosis not present

## 2022-03-02 DIAGNOSIS — N250 Renal osteodystrophy: Secondary | ICD-10-CM | POA: Diagnosis not present

## 2022-03-02 DIAGNOSIS — N186 End stage renal disease: Secondary | ICD-10-CM | POA: Diagnosis not present

## 2022-03-02 DIAGNOSIS — Z992 Dependence on renal dialysis: Secondary | ICD-10-CM | POA: Diagnosis not present

## 2022-03-02 DIAGNOSIS — D631 Anemia in chronic kidney disease: Secondary | ICD-10-CM | POA: Diagnosis not present

## 2022-03-03 DIAGNOSIS — N250 Renal osteodystrophy: Secondary | ICD-10-CM | POA: Diagnosis not present

## 2022-03-03 DIAGNOSIS — N186 End stage renal disease: Secondary | ICD-10-CM | POA: Diagnosis not present

## 2022-03-03 DIAGNOSIS — Z992 Dependence on renal dialysis: Secondary | ICD-10-CM | POA: Diagnosis not present

## 2022-03-03 DIAGNOSIS — D631 Anemia in chronic kidney disease: Secondary | ICD-10-CM | POA: Diagnosis not present

## 2022-03-03 DIAGNOSIS — D509 Iron deficiency anemia, unspecified: Secondary | ICD-10-CM | POA: Diagnosis not present

## 2022-03-04 DIAGNOSIS — N186 End stage renal disease: Secondary | ICD-10-CM | POA: Diagnosis not present

## 2022-03-04 DIAGNOSIS — N250 Renal osteodystrophy: Secondary | ICD-10-CM | POA: Diagnosis not present

## 2022-03-04 DIAGNOSIS — D509 Iron deficiency anemia, unspecified: Secondary | ICD-10-CM | POA: Diagnosis not present

## 2022-03-04 DIAGNOSIS — D631 Anemia in chronic kidney disease: Secondary | ICD-10-CM | POA: Diagnosis not present

## 2022-03-04 DIAGNOSIS — Z992 Dependence on renal dialysis: Secondary | ICD-10-CM | POA: Diagnosis not present

## 2022-03-05 DIAGNOSIS — N186 End stage renal disease: Secondary | ICD-10-CM | POA: Diagnosis not present

## 2022-03-05 DIAGNOSIS — D509 Iron deficiency anemia, unspecified: Secondary | ICD-10-CM | POA: Diagnosis not present

## 2022-03-05 DIAGNOSIS — Z992 Dependence on renal dialysis: Secondary | ICD-10-CM | POA: Diagnosis not present

## 2022-03-05 DIAGNOSIS — D631 Anemia in chronic kidney disease: Secondary | ICD-10-CM | POA: Diagnosis not present

## 2022-03-05 DIAGNOSIS — N250 Renal osteodystrophy: Secondary | ICD-10-CM | POA: Diagnosis not present

## 2022-03-06 DIAGNOSIS — D509 Iron deficiency anemia, unspecified: Secondary | ICD-10-CM | POA: Diagnosis not present

## 2022-03-06 DIAGNOSIS — N250 Renal osteodystrophy: Secondary | ICD-10-CM | POA: Diagnosis not present

## 2022-03-06 DIAGNOSIS — D631 Anemia in chronic kidney disease: Secondary | ICD-10-CM | POA: Diagnosis not present

## 2022-03-06 DIAGNOSIS — Z992 Dependence on renal dialysis: Secondary | ICD-10-CM | POA: Diagnosis not present

## 2022-03-06 DIAGNOSIS — N186 End stage renal disease: Secondary | ICD-10-CM | POA: Diagnosis not present

## 2022-03-07 ENCOUNTER — Other Ambulatory Visit: Payer: Self-pay | Admitting: *Deleted

## 2022-03-07 DIAGNOSIS — D631 Anemia in chronic kidney disease: Secondary | ICD-10-CM | POA: Diagnosis not present

## 2022-03-07 DIAGNOSIS — D509 Iron deficiency anemia, unspecified: Secondary | ICD-10-CM | POA: Diagnosis not present

## 2022-03-07 DIAGNOSIS — N186 End stage renal disease: Secondary | ICD-10-CM | POA: Diagnosis not present

## 2022-03-07 DIAGNOSIS — N250 Renal osteodystrophy: Secondary | ICD-10-CM | POA: Diagnosis not present

## 2022-03-07 DIAGNOSIS — Z992 Dependence on renal dialysis: Secondary | ICD-10-CM | POA: Diagnosis not present

## 2022-03-07 NOTE — Patient Outreach (Signed)
[facility]) Care Management  03/07/2022  [name] [name] 17-Jun-1951 [id number]  Follow up message received regarding Meals on Wheels. [name] said he figured out that [name] is the Education officer, museum at [facility] dialysis [facility]. He is planning on talking with her about this service on his next visit. No other needs today. We agreed to talk on our next scheduled appt 03/23/22 or if anything comes up he knows he can call me.  [name] [name], MSN, [facility] Gerontological Nurse Practitioner [facility] Care Management [phone]

## 2022-03-08 DIAGNOSIS — D631 Anemia in chronic kidney disease: Secondary | ICD-10-CM | POA: Diagnosis not present

## 2022-03-08 DIAGNOSIS — Z992 Dependence on renal dialysis: Secondary | ICD-10-CM | POA: Diagnosis not present

## 2022-03-08 DIAGNOSIS — N250 Renal osteodystrophy: Secondary | ICD-10-CM | POA: Diagnosis not present

## 2022-03-08 DIAGNOSIS — N186 End stage renal disease: Secondary | ICD-10-CM | POA: Diagnosis not present

## 2022-03-08 DIAGNOSIS — D509 Iron deficiency anemia, unspecified: Secondary | ICD-10-CM | POA: Diagnosis not present

## 2022-03-09 DIAGNOSIS — D509 Iron deficiency anemia, unspecified: Secondary | ICD-10-CM | POA: Diagnosis not present

## 2022-03-09 DIAGNOSIS — D631 Anemia in chronic kidney disease: Secondary | ICD-10-CM | POA: Diagnosis not present

## 2022-03-09 DIAGNOSIS — N186 End stage renal disease: Secondary | ICD-10-CM | POA: Diagnosis not present

## 2022-03-09 DIAGNOSIS — N250 Renal osteodystrophy: Secondary | ICD-10-CM | POA: Diagnosis not present

## 2022-03-09 DIAGNOSIS — Z992 Dependence on renal dialysis: Secondary | ICD-10-CM | POA: Diagnosis not present

## 2022-03-10 DIAGNOSIS — D509 Iron deficiency anemia, unspecified: Secondary | ICD-10-CM | POA: Diagnosis not present

## 2022-03-10 DIAGNOSIS — Z992 Dependence on renal dialysis: Secondary | ICD-10-CM | POA: Diagnosis not present

## 2022-03-10 DIAGNOSIS — N250 Renal osteodystrophy: Secondary | ICD-10-CM | POA: Diagnosis not present

## 2022-03-10 DIAGNOSIS — D631 Anemia in chronic kidney disease: Secondary | ICD-10-CM | POA: Diagnosis not present

## 2022-03-10 DIAGNOSIS — N186 End stage renal disease: Secondary | ICD-10-CM | POA: Diagnosis not present

## 2022-03-11 DIAGNOSIS — D509 Iron deficiency anemia, unspecified: Secondary | ICD-10-CM | POA: Diagnosis not present

## 2022-03-11 DIAGNOSIS — Z992 Dependence on renal dialysis: Secondary | ICD-10-CM | POA: Diagnosis not present

## 2022-03-11 DIAGNOSIS — N186 End stage renal disease: Secondary | ICD-10-CM | POA: Diagnosis not present

## 2022-03-11 DIAGNOSIS — D631 Anemia in chronic kidney disease: Secondary | ICD-10-CM | POA: Diagnosis not present

## 2022-03-11 DIAGNOSIS — N250 Renal osteodystrophy: Secondary | ICD-10-CM | POA: Diagnosis not present

## 2022-03-12 DIAGNOSIS — D509 Iron deficiency anemia, unspecified: Secondary | ICD-10-CM | POA: Diagnosis not present

## 2022-03-12 DIAGNOSIS — N250 Renal osteodystrophy: Secondary | ICD-10-CM | POA: Diagnosis not present

## 2022-03-12 DIAGNOSIS — Z992 Dependence on renal dialysis: Secondary | ICD-10-CM | POA: Diagnosis not present

## 2022-03-12 DIAGNOSIS — D631 Anemia in chronic kidney disease: Secondary | ICD-10-CM | POA: Diagnosis not present

## 2022-03-12 DIAGNOSIS — N186 End stage renal disease: Secondary | ICD-10-CM | POA: Diagnosis not present

## 2022-03-13 DIAGNOSIS — D509 Iron deficiency anemia, unspecified: Secondary | ICD-10-CM | POA: Diagnosis not present

## 2022-03-13 DIAGNOSIS — N250 Renal osteodystrophy: Secondary | ICD-10-CM | POA: Diagnosis not present

## 2022-03-13 DIAGNOSIS — D631 Anemia in chronic kidney disease: Secondary | ICD-10-CM | POA: Diagnosis not present

## 2022-03-13 DIAGNOSIS — N186 End stage renal disease: Secondary | ICD-10-CM | POA: Diagnosis not present

## 2022-03-13 DIAGNOSIS — Z992 Dependence on renal dialysis: Secondary | ICD-10-CM | POA: Diagnosis not present

## 2022-03-14 DIAGNOSIS — N250 Renal osteodystrophy: Secondary | ICD-10-CM | POA: Diagnosis not present

## 2022-03-14 DIAGNOSIS — N186 End stage renal disease: Secondary | ICD-10-CM | POA: Diagnosis not present

## 2022-03-14 DIAGNOSIS — D631 Anemia in chronic kidney disease: Secondary | ICD-10-CM | POA: Diagnosis not present

## 2022-03-14 DIAGNOSIS — D509 Iron deficiency anemia, unspecified: Secondary | ICD-10-CM | POA: Diagnosis not present

## 2022-03-14 DIAGNOSIS — Z992 Dependence on renal dialysis: Secondary | ICD-10-CM | POA: Diagnosis not present

## 2022-03-15 DIAGNOSIS — D509 Iron deficiency anemia, unspecified: Secondary | ICD-10-CM | POA: Diagnosis not present

## 2022-03-15 DIAGNOSIS — N186 End stage renal disease: Secondary | ICD-10-CM | POA: Diagnosis not present

## 2022-03-15 DIAGNOSIS — Z992 Dependence on renal dialysis: Secondary | ICD-10-CM | POA: Diagnosis not present

## 2022-03-15 DIAGNOSIS — D631 Anemia in chronic kidney disease: Secondary | ICD-10-CM | POA: Diagnosis not present

## 2022-03-15 DIAGNOSIS — N250 Renal osteodystrophy: Secondary | ICD-10-CM | POA: Diagnosis not present

## 2022-03-16 ENCOUNTER — Other Ambulatory Visit: Payer: Self-pay | Admitting: "Endocrinology

## 2022-03-16 DIAGNOSIS — D509 Iron deficiency anemia, unspecified: Secondary | ICD-10-CM | POA: Diagnosis not present

## 2022-03-16 DIAGNOSIS — Z992 Dependence on renal dialysis: Secondary | ICD-10-CM | POA: Diagnosis not present

## 2022-03-16 DIAGNOSIS — N186 End stage renal disease: Secondary | ICD-10-CM | POA: Diagnosis not present

## 2022-03-16 DIAGNOSIS — D631 Anemia in chronic kidney disease: Secondary | ICD-10-CM | POA: Diagnosis not present

## 2022-03-16 DIAGNOSIS — N250 Renal osteodystrophy: Secondary | ICD-10-CM | POA: Diagnosis not present

## 2022-03-16 DIAGNOSIS — E1122 Type 2 diabetes mellitus with diabetic chronic kidney disease: Secondary | ICD-10-CM

## 2022-03-17 DIAGNOSIS — N186 End stage renal disease: Secondary | ICD-10-CM | POA: Diagnosis not present

## 2022-03-17 DIAGNOSIS — Z992 Dependence on renal dialysis: Secondary | ICD-10-CM | POA: Diagnosis not present

## 2022-03-17 DIAGNOSIS — D631 Anemia in chronic kidney disease: Secondary | ICD-10-CM | POA: Diagnosis not present

## 2022-03-17 DIAGNOSIS — D509 Iron deficiency anemia, unspecified: Secondary | ICD-10-CM | POA: Diagnosis not present

## 2022-03-17 DIAGNOSIS — N250 Renal osteodystrophy: Secondary | ICD-10-CM | POA: Diagnosis not present

## 2022-03-18 DIAGNOSIS — D631 Anemia in chronic kidney disease: Secondary | ICD-10-CM | POA: Diagnosis not present

## 2022-03-18 DIAGNOSIS — N250 Renal osteodystrophy: Secondary | ICD-10-CM | POA: Diagnosis not present

## 2022-03-18 DIAGNOSIS — D509 Iron deficiency anemia, unspecified: Secondary | ICD-10-CM | POA: Diagnosis not present

## 2022-03-18 DIAGNOSIS — N186 End stage renal disease: Secondary | ICD-10-CM | POA: Diagnosis not present

## 2022-03-18 DIAGNOSIS — Z992 Dependence on renal dialysis: Secondary | ICD-10-CM | POA: Diagnosis not present

## 2022-03-19 DIAGNOSIS — D509 Iron deficiency anemia, unspecified: Secondary | ICD-10-CM | POA: Diagnosis not present

## 2022-03-19 DIAGNOSIS — N250 Renal osteodystrophy: Secondary | ICD-10-CM | POA: Diagnosis not present

## 2022-03-19 DIAGNOSIS — Z992 Dependence on renal dialysis: Secondary | ICD-10-CM | POA: Diagnosis not present

## 2022-03-19 DIAGNOSIS — N186 End stage renal disease: Secondary | ICD-10-CM | POA: Diagnosis not present

## 2022-03-19 DIAGNOSIS — D631 Anemia in chronic kidney disease: Secondary | ICD-10-CM | POA: Diagnosis not present

## 2022-03-20 ENCOUNTER — Other Ambulatory Visit: Payer: Self-pay | Admitting: Nurse Practitioner

## 2022-03-20 DIAGNOSIS — D631 Anemia in chronic kidney disease: Secondary | ICD-10-CM | POA: Diagnosis not present

## 2022-03-20 DIAGNOSIS — N185 Chronic kidney disease, stage 5: Secondary | ICD-10-CM

## 2022-03-20 DIAGNOSIS — N250 Renal osteodystrophy: Secondary | ICD-10-CM | POA: Diagnosis not present

## 2022-03-20 DIAGNOSIS — D509 Iron deficiency anemia, unspecified: Secondary | ICD-10-CM | POA: Diagnosis not present

## 2022-03-20 DIAGNOSIS — Z992 Dependence on renal dialysis: Secondary | ICD-10-CM | POA: Diagnosis not present

## 2022-03-20 DIAGNOSIS — N186 End stage renal disease: Secondary | ICD-10-CM | POA: Diagnosis not present

## 2022-03-21 DIAGNOSIS — D631 Anemia in chronic kidney disease: Secondary | ICD-10-CM | POA: Diagnosis not present

## 2022-03-21 DIAGNOSIS — D509 Iron deficiency anemia, unspecified: Secondary | ICD-10-CM | POA: Diagnosis not present

## 2022-03-21 DIAGNOSIS — Z992 Dependence on renal dialysis: Secondary | ICD-10-CM | POA: Diagnosis not present

## 2022-03-21 DIAGNOSIS — N250 Renal osteodystrophy: Secondary | ICD-10-CM | POA: Diagnosis not present

## 2022-03-21 DIAGNOSIS — N186 End stage renal disease: Secondary | ICD-10-CM | POA: Diagnosis not present

## 2022-03-22 DIAGNOSIS — N250 Renal osteodystrophy: Secondary | ICD-10-CM | POA: Diagnosis not present

## 2022-03-22 DIAGNOSIS — N186 End stage renal disease: Secondary | ICD-10-CM | POA: Diagnosis not present

## 2022-03-22 DIAGNOSIS — Z992 Dependence on renal dialysis: Secondary | ICD-10-CM | POA: Diagnosis not present

## 2022-03-22 DIAGNOSIS — D631 Anemia in chronic kidney disease: Secondary | ICD-10-CM | POA: Diagnosis not present

## 2022-03-22 DIAGNOSIS — D509 Iron deficiency anemia, unspecified: Secondary | ICD-10-CM | POA: Diagnosis not present

## 2022-03-23 ENCOUNTER — Telehealth: Payer: Self-pay | Admitting: *Deleted

## 2022-03-23 ENCOUNTER — Ambulatory Visit: Payer: Self-pay | Admitting: *Deleted

## 2022-03-23 DIAGNOSIS — Z992 Dependence on renal dialysis: Secondary | ICD-10-CM | POA: Diagnosis not present

## 2022-03-23 DIAGNOSIS — D631 Anemia in chronic kidney disease: Secondary | ICD-10-CM | POA: Diagnosis not present

## 2022-03-23 DIAGNOSIS — N186 End stage renal disease: Secondary | ICD-10-CM | POA: Diagnosis not present

## 2022-03-23 DIAGNOSIS — D509 Iron deficiency anemia, unspecified: Secondary | ICD-10-CM | POA: Diagnosis not present

## 2022-03-23 DIAGNOSIS — N250 Renal osteodystrophy: Secondary | ICD-10-CM | POA: Diagnosis not present

## 2022-03-23 NOTE — Patient Outreach (Signed)
  Care Coordination   Follow Up Visit Note   03/23/2022 Name: [name] [name] MRN: [id number] DOB: 06/19/1951  [name] [name] is a 71 y.o. year old male who sees [name] [name], [name] Baxter, MD for primary care. I spoke with  [name] [name] by phone today.  What matters to the patients health and wellness today?  Continue working on wt loss.    Goals Addressed               This Visit's Progress     Patient Stated     I need to lose wt to be eligible for possible kidney transplant. Goal is 195#, current wt 219#, has already lost 16# in last 3 months. (pt-stated)   Not on track     Care Coordination Interventions: Obtained follow up report from pt. His wt went up 1#. We had to convert Kgs to Lbs = 220# His home scale is set on Kgs but when he changes this to lbs it does not come out what the conversion chart says. Request pt to take in scale and have them explain or trouble shoot his home scale. Discussed if peritoneal fluids are included in his wt measurements. He states that it is subtracted. Request for him to find out how much wt the fluids are on his next visit at the end of the month.  Request that since it seems he has reached a wt plateau if he can be considered for the transplant now or if he must lose down to 195# and if that is the dry wt (-5# +/-). Encouraged pt to continue his adherence to his ADA diet and exercise.        SDOH assessments and interventions completed:  No     Care Coordination Interventions Activated:  Yes  Care Coordination Interventions:  Yes, provided   Follow up plan: Follow up call scheduled for October 6th.    Encounter Outcome:  Pt. Visit Completed   Kayleen Memos C. [name], MSN, [facility] Gerontological Nurse Practitioner [facility] Care Management [phone]

## 2022-03-24 DIAGNOSIS — N186 End stage renal disease: Secondary | ICD-10-CM | POA: Diagnosis not present

## 2022-03-24 DIAGNOSIS — N250 Renal osteodystrophy: Secondary | ICD-10-CM | POA: Diagnosis not present

## 2022-03-24 DIAGNOSIS — Z992 Dependence on renal dialysis: Secondary | ICD-10-CM | POA: Diagnosis not present

## 2022-03-24 DIAGNOSIS — D631 Anemia in chronic kidney disease: Secondary | ICD-10-CM | POA: Diagnosis not present

## 2022-03-24 DIAGNOSIS — D509 Iron deficiency anemia, unspecified: Secondary | ICD-10-CM | POA: Diagnosis not present

## 2022-03-25 DIAGNOSIS — Z992 Dependence on renal dialysis: Secondary | ICD-10-CM | POA: Diagnosis not present

## 2022-03-25 DIAGNOSIS — D631 Anemia in chronic kidney disease: Secondary | ICD-10-CM | POA: Diagnosis not present

## 2022-03-25 DIAGNOSIS — N186 End stage renal disease: Secondary | ICD-10-CM | POA: Diagnosis not present

## 2022-03-25 DIAGNOSIS — D509 Iron deficiency anemia, unspecified: Secondary | ICD-10-CM | POA: Diagnosis not present

## 2022-03-25 DIAGNOSIS — N250 Renal osteodystrophy: Secondary | ICD-10-CM | POA: Diagnosis not present

## 2022-03-26 DIAGNOSIS — N186 End stage renal disease: Secondary | ICD-10-CM | POA: Diagnosis not present

## 2022-03-26 DIAGNOSIS — N250 Renal osteodystrophy: Secondary | ICD-10-CM | POA: Diagnosis not present

## 2022-03-26 DIAGNOSIS — D631 Anemia in chronic kidney disease: Secondary | ICD-10-CM | POA: Diagnosis not present

## 2022-03-26 DIAGNOSIS — D509 Iron deficiency anemia, unspecified: Secondary | ICD-10-CM | POA: Diagnosis not present

## 2022-03-26 DIAGNOSIS — Z992 Dependence on renal dialysis: Secondary | ICD-10-CM | POA: Diagnosis not present

## 2022-03-27 DIAGNOSIS — N186 End stage renal disease: Secondary | ICD-10-CM | POA: Diagnosis not present

## 2022-03-27 DIAGNOSIS — D509 Iron deficiency anemia, unspecified: Secondary | ICD-10-CM | POA: Diagnosis not present

## 2022-03-27 DIAGNOSIS — N250 Renal osteodystrophy: Secondary | ICD-10-CM | POA: Diagnosis not present

## 2022-03-27 DIAGNOSIS — Z992 Dependence on renal dialysis: Secondary | ICD-10-CM | POA: Diagnosis not present

## 2022-03-27 DIAGNOSIS — D631 Anemia in chronic kidney disease: Secondary | ICD-10-CM | POA: Diagnosis not present

## 2022-03-28 DIAGNOSIS — Z992 Dependence on renal dialysis: Secondary | ICD-10-CM | POA: Diagnosis not present

## 2022-03-28 DIAGNOSIS — D509 Iron deficiency anemia, unspecified: Secondary | ICD-10-CM | POA: Diagnosis not present

## 2022-03-28 DIAGNOSIS — N250 Renal osteodystrophy: Secondary | ICD-10-CM | POA: Diagnosis not present

## 2022-03-28 DIAGNOSIS — N186 End stage renal disease: Secondary | ICD-10-CM | POA: Diagnosis not present

## 2022-03-28 DIAGNOSIS — D631 Anemia in chronic kidney disease: Secondary | ICD-10-CM | POA: Diagnosis not present

## 2022-03-29 DIAGNOSIS — N250 Renal osteodystrophy: Secondary | ICD-10-CM | POA: Diagnosis not present

## 2022-03-29 DIAGNOSIS — N186 End stage renal disease: Secondary | ICD-10-CM | POA: Diagnosis not present

## 2022-03-29 DIAGNOSIS — Z992 Dependence on renal dialysis: Secondary | ICD-10-CM | POA: Diagnosis not present

## 2022-03-29 DIAGNOSIS — D509 Iron deficiency anemia, unspecified: Secondary | ICD-10-CM | POA: Diagnosis not present

## 2022-03-29 DIAGNOSIS — D631 Anemia in chronic kidney disease: Secondary | ICD-10-CM | POA: Diagnosis not present

## 2022-03-30 DIAGNOSIS — N186 End stage renal disease: Secondary | ICD-10-CM | POA: Diagnosis not present

## 2022-03-30 DIAGNOSIS — N250 Renal osteodystrophy: Secondary | ICD-10-CM | POA: Diagnosis not present

## 2022-03-30 DIAGNOSIS — Z992 Dependence on renal dialysis: Secondary | ICD-10-CM | POA: Diagnosis not present

## 2022-03-30 DIAGNOSIS — D509 Iron deficiency anemia, unspecified: Secondary | ICD-10-CM | POA: Diagnosis not present

## 2022-03-30 DIAGNOSIS — D631 Anemia in chronic kidney disease: Secondary | ICD-10-CM | POA: Diagnosis not present

## 2022-03-31 DIAGNOSIS — Z992 Dependence on renal dialysis: Secondary | ICD-10-CM | POA: Diagnosis not present

## 2022-03-31 DIAGNOSIS — N186 End stage renal disease: Secondary | ICD-10-CM | POA: Diagnosis not present

## 2022-03-31 DIAGNOSIS — N250 Renal osteodystrophy: Secondary | ICD-10-CM | POA: Diagnosis not present

## 2022-03-31 DIAGNOSIS — D509 Iron deficiency anemia, unspecified: Secondary | ICD-10-CM | POA: Diagnosis not present

## 2022-03-31 DIAGNOSIS — D631 Anemia in chronic kidney disease: Secondary | ICD-10-CM | POA: Diagnosis not present

## 2022-04-01 DIAGNOSIS — D509 Iron deficiency anemia, unspecified: Secondary | ICD-10-CM | POA: Diagnosis not present

## 2022-04-01 DIAGNOSIS — Z992 Dependence on renal dialysis: Secondary | ICD-10-CM | POA: Diagnosis not present

## 2022-04-01 DIAGNOSIS — N250 Renal osteodystrophy: Secondary | ICD-10-CM | POA: Diagnosis not present

## 2022-04-01 DIAGNOSIS — N186 End stage renal disease: Secondary | ICD-10-CM | POA: Diagnosis not present

## 2022-04-01 DIAGNOSIS — D631 Anemia in chronic kidney disease: Secondary | ICD-10-CM | POA: Diagnosis not present

## 2022-04-02 DIAGNOSIS — D509 Iron deficiency anemia, unspecified: Secondary | ICD-10-CM | POA: Diagnosis not present

## 2022-04-02 DIAGNOSIS — N186 End stage renal disease: Secondary | ICD-10-CM | POA: Diagnosis not present

## 2022-04-02 DIAGNOSIS — Z992 Dependence on renal dialysis: Secondary | ICD-10-CM | POA: Diagnosis not present

## 2022-04-02 DIAGNOSIS — N250 Renal osteodystrophy: Secondary | ICD-10-CM | POA: Diagnosis not present

## 2022-04-02 DIAGNOSIS — D631 Anemia in chronic kidney disease: Secondary | ICD-10-CM | POA: Diagnosis not present

## 2022-04-03 DIAGNOSIS — Z992 Dependence on renal dialysis: Secondary | ICD-10-CM | POA: Diagnosis not present

## 2022-04-03 DIAGNOSIS — D631 Anemia in chronic kidney disease: Secondary | ICD-10-CM | POA: Diagnosis not present

## 2022-04-03 DIAGNOSIS — D509 Iron deficiency anemia, unspecified: Secondary | ICD-10-CM | POA: Diagnosis not present

## 2022-04-03 DIAGNOSIS — N250 Renal osteodystrophy: Secondary | ICD-10-CM | POA: Diagnosis not present

## 2022-04-03 DIAGNOSIS — N186 End stage renal disease: Secondary | ICD-10-CM | POA: Diagnosis not present

## 2022-04-04 DIAGNOSIS — N186 End stage renal disease: Secondary | ICD-10-CM | POA: Diagnosis not present

## 2022-04-04 DIAGNOSIS — N250 Renal osteodystrophy: Secondary | ICD-10-CM | POA: Diagnosis not present

## 2022-04-04 DIAGNOSIS — Z992 Dependence on renal dialysis: Secondary | ICD-10-CM | POA: Diagnosis not present

## 2022-04-04 DIAGNOSIS — D631 Anemia in chronic kidney disease: Secondary | ICD-10-CM | POA: Diagnosis not present

## 2022-04-04 DIAGNOSIS — D509 Iron deficiency anemia, unspecified: Secondary | ICD-10-CM | POA: Diagnosis not present

## 2022-04-05 DIAGNOSIS — N186 End stage renal disease: Secondary | ICD-10-CM | POA: Diagnosis not present

## 2022-04-05 DIAGNOSIS — B9689 Other specified bacterial agents as the cause of diseases classified elsewhere: Secondary | ICD-10-CM | POA: Diagnosis not present

## 2022-04-05 DIAGNOSIS — L820 Inflamed seborrheic keratosis: Secondary | ICD-10-CM | POA: Diagnosis not present

## 2022-04-05 DIAGNOSIS — N250 Renal osteodystrophy: Secondary | ICD-10-CM | POA: Diagnosis not present

## 2022-04-05 DIAGNOSIS — Z992 Dependence on renal dialysis: Secondary | ICD-10-CM | POA: Diagnosis not present

## 2022-04-05 DIAGNOSIS — D631 Anemia in chronic kidney disease: Secondary | ICD-10-CM | POA: Diagnosis not present

## 2022-04-05 DIAGNOSIS — D509 Iron deficiency anemia, unspecified: Secondary | ICD-10-CM | POA: Diagnosis not present

## 2022-04-05 DIAGNOSIS — L02821 Furuncle of head [any part, except face]: Secondary | ICD-10-CM | POA: Diagnosis not present

## 2022-04-06 ENCOUNTER — Other Ambulatory Visit: Payer: Self-pay

## 2022-04-06 DIAGNOSIS — D509 Iron deficiency anemia, unspecified: Secondary | ICD-10-CM | POA: Diagnosis not present

## 2022-04-06 DIAGNOSIS — N186 End stage renal disease: Secondary | ICD-10-CM | POA: Diagnosis not present

## 2022-04-06 DIAGNOSIS — N250 Renal osteodystrophy: Secondary | ICD-10-CM | POA: Diagnosis not present

## 2022-04-06 DIAGNOSIS — Z992 Dependence on renal dialysis: Secondary | ICD-10-CM | POA: Diagnosis not present

## 2022-04-06 DIAGNOSIS — D631 Anemia in chronic kidney disease: Secondary | ICD-10-CM | POA: Diagnosis not present

## 2022-04-06 MED ORDER — LOSARTAN POTASSIUM 25 MG PO TABS: 25.0000 mg | ORAL_TABLET | Freq: Every day | ORAL | 9 refills | Status: DC

## 2022-04-07 DIAGNOSIS — Z992 Dependence on renal dialysis: Secondary | ICD-10-CM | POA: Diagnosis not present

## 2022-04-07 DIAGNOSIS — D509 Iron deficiency anemia, unspecified: Secondary | ICD-10-CM | POA: Diagnosis not present

## 2022-04-07 DIAGNOSIS — N250 Renal osteodystrophy: Secondary | ICD-10-CM | POA: Diagnosis not present

## 2022-04-07 DIAGNOSIS — D631 Anemia in chronic kidney disease: Secondary | ICD-10-CM | POA: Diagnosis not present

## 2022-04-07 DIAGNOSIS — N186 End stage renal disease: Secondary | ICD-10-CM | POA: Diagnosis not present

## 2022-04-08 DIAGNOSIS — D509 Iron deficiency anemia, unspecified: Secondary | ICD-10-CM | POA: Diagnosis not present

## 2022-04-08 DIAGNOSIS — Z992 Dependence on renal dialysis: Secondary | ICD-10-CM | POA: Diagnosis not present

## 2022-04-08 DIAGNOSIS — N250 Renal osteodystrophy: Secondary | ICD-10-CM | POA: Diagnosis not present

## 2022-04-08 DIAGNOSIS — D631 Anemia in chronic kidney disease: Secondary | ICD-10-CM | POA: Diagnosis not present

## 2022-04-08 DIAGNOSIS — N186 End stage renal disease: Secondary | ICD-10-CM | POA: Diagnosis not present

## 2022-04-09 ENCOUNTER — Other Ambulatory Visit: Payer: Self-pay | Admitting: Internal Medicine

## 2022-04-09 DIAGNOSIS — Z992 Dependence on renal dialysis: Secondary | ICD-10-CM | POA: Diagnosis not present

## 2022-04-09 DIAGNOSIS — N186 End stage renal disease: Secondary | ICD-10-CM | POA: Diagnosis not present

## 2022-04-09 DIAGNOSIS — D509 Iron deficiency anemia, unspecified: Secondary | ICD-10-CM | POA: Diagnosis not present

## 2022-04-09 DIAGNOSIS — N250 Renal osteodystrophy: Secondary | ICD-10-CM | POA: Diagnosis not present

## 2022-04-09 DIAGNOSIS — D631 Anemia in chronic kidney disease: Secondary | ICD-10-CM | POA: Diagnosis not present

## 2022-04-10 DIAGNOSIS — N186 End stage renal disease: Secondary | ICD-10-CM | POA: Diagnosis not present

## 2022-04-10 DIAGNOSIS — D509 Iron deficiency anemia, unspecified: Secondary | ICD-10-CM | POA: Diagnosis not present

## 2022-04-10 DIAGNOSIS — Z992 Dependence on renal dialysis: Secondary | ICD-10-CM | POA: Diagnosis not present

## 2022-04-10 DIAGNOSIS — N250 Renal osteodystrophy: Secondary | ICD-10-CM | POA: Diagnosis not present

## 2022-04-10 DIAGNOSIS — D631 Anemia in chronic kidney disease: Secondary | ICD-10-CM | POA: Diagnosis not present

## 2022-04-11 ENCOUNTER — Other Ambulatory Visit: Payer: Self-pay | Admitting: Nurse Practitioner

## 2022-04-11 DIAGNOSIS — N186 End stage renal disease: Secondary | ICD-10-CM | POA: Diagnosis not present

## 2022-04-11 DIAGNOSIS — Z992 Dependence on renal dialysis: Secondary | ICD-10-CM | POA: Diagnosis not present

## 2022-04-11 DIAGNOSIS — E1122 Type 2 diabetes mellitus with diabetic chronic kidney disease: Secondary | ICD-10-CM

## 2022-04-11 DIAGNOSIS — D631 Anemia in chronic kidney disease: Secondary | ICD-10-CM | POA: Diagnosis not present

## 2022-04-11 DIAGNOSIS — N250 Renal osteodystrophy: Secondary | ICD-10-CM | POA: Diagnosis not present

## 2022-04-11 DIAGNOSIS — D509 Iron deficiency anemia, unspecified: Secondary | ICD-10-CM | POA: Diagnosis not present

## 2022-04-12 ENCOUNTER — Other Ambulatory Visit: Payer: Self-pay | Admitting: *Deleted

## 2022-04-12 DIAGNOSIS — Z992 Dependence on renal dialysis: Secondary | ICD-10-CM | POA: Diagnosis not present

## 2022-04-12 DIAGNOSIS — D631 Anemia in chronic kidney disease: Secondary | ICD-10-CM | POA: Diagnosis not present

## 2022-04-12 DIAGNOSIS — N186 End stage renal disease: Secondary | ICD-10-CM | POA: Diagnosis not present

## 2022-04-12 DIAGNOSIS — N250 Renal osteodystrophy: Secondary | ICD-10-CM | POA: Diagnosis not present

## 2022-04-12 DIAGNOSIS — D509 Iron deficiency anemia, unspecified: Secondary | ICD-10-CM | POA: Diagnosis not present

## 2022-04-12 NOTE — Telephone Encounter (Signed)
I am waiting to address this request with other nurse.

## 2022-04-13 DIAGNOSIS — N186 End stage renal disease: Secondary | ICD-10-CM | POA: Diagnosis not present

## 2022-04-13 DIAGNOSIS — Z992 Dependence on renal dialysis: Secondary | ICD-10-CM | POA: Diagnosis not present

## 2022-04-13 DIAGNOSIS — N250 Renal osteodystrophy: Secondary | ICD-10-CM | POA: Diagnosis not present

## 2022-04-13 DIAGNOSIS — D631 Anemia in chronic kidney disease: Secondary | ICD-10-CM | POA: Diagnosis not present

## 2022-04-13 DIAGNOSIS — D509 Iron deficiency anemia, unspecified: Secondary | ICD-10-CM | POA: Diagnosis not present

## 2022-04-14 DIAGNOSIS — Z992 Dependence on renal dialysis: Secondary | ICD-10-CM | POA: Diagnosis not present

## 2022-04-14 DIAGNOSIS — N250 Renal osteodystrophy: Secondary | ICD-10-CM | POA: Diagnosis not present

## 2022-04-14 DIAGNOSIS — N186 End stage renal disease: Secondary | ICD-10-CM | POA: Diagnosis not present

## 2022-04-14 DIAGNOSIS — D631 Anemia in chronic kidney disease: Secondary | ICD-10-CM | POA: Diagnosis not present

## 2022-04-14 DIAGNOSIS — D509 Iron deficiency anemia, unspecified: Secondary | ICD-10-CM | POA: Diagnosis not present

## 2022-04-15 DIAGNOSIS — Z992 Dependence on renal dialysis: Secondary | ICD-10-CM | POA: Diagnosis not present

## 2022-04-15 DIAGNOSIS — D509 Iron deficiency anemia, unspecified: Secondary | ICD-10-CM | POA: Diagnosis not present

## 2022-04-15 DIAGNOSIS — N186 End stage renal disease: Secondary | ICD-10-CM | POA: Diagnosis not present

## 2022-04-15 DIAGNOSIS — N250 Renal osteodystrophy: Secondary | ICD-10-CM | POA: Diagnosis not present

## 2022-04-15 DIAGNOSIS — D631 Anemia in chronic kidney disease: Secondary | ICD-10-CM | POA: Diagnosis not present

## 2022-04-16 DIAGNOSIS — N250 Renal osteodystrophy: Secondary | ICD-10-CM | POA: Diagnosis not present

## 2022-04-16 DIAGNOSIS — D631 Anemia in chronic kidney disease: Secondary | ICD-10-CM | POA: Diagnosis not present

## 2022-04-16 DIAGNOSIS — D509 Iron deficiency anemia, unspecified: Secondary | ICD-10-CM | POA: Diagnosis not present

## 2022-04-16 DIAGNOSIS — Z992 Dependence on renal dialysis: Secondary | ICD-10-CM | POA: Diagnosis not present

## 2022-04-16 DIAGNOSIS — N186 End stage renal disease: Secondary | ICD-10-CM | POA: Diagnosis not present

## 2022-04-17 DIAGNOSIS — N250 Renal osteodystrophy: Secondary | ICD-10-CM | POA: Diagnosis not present

## 2022-04-17 DIAGNOSIS — D631 Anemia in chronic kidney disease: Secondary | ICD-10-CM | POA: Diagnosis not present

## 2022-04-17 DIAGNOSIS — Z992 Dependence on renal dialysis: Secondary | ICD-10-CM | POA: Diagnosis not present

## 2022-04-17 DIAGNOSIS — D509 Iron deficiency anemia, unspecified: Secondary | ICD-10-CM | POA: Diagnosis not present

## 2022-04-17 DIAGNOSIS — N186 End stage renal disease: Secondary | ICD-10-CM | POA: Diagnosis not present

## 2022-04-18 DIAGNOSIS — D631 Anemia in chronic kidney disease: Secondary | ICD-10-CM | POA: Diagnosis not present

## 2022-04-18 DIAGNOSIS — N186 End stage renal disease: Secondary | ICD-10-CM | POA: Diagnosis not present

## 2022-04-18 DIAGNOSIS — Z992 Dependence on renal dialysis: Secondary | ICD-10-CM | POA: Diagnosis not present

## 2022-04-18 DIAGNOSIS — D509 Iron deficiency anemia, unspecified: Secondary | ICD-10-CM | POA: Diagnosis not present

## 2022-04-18 DIAGNOSIS — N250 Renal osteodystrophy: Secondary | ICD-10-CM | POA: Diagnosis not present

## 2022-04-19 ENCOUNTER — Telehealth: Payer: Self-pay | Admitting: *Deleted

## 2022-04-19 ENCOUNTER — Encounter: Payer: Self-pay | Admitting: *Deleted

## 2022-04-19 DIAGNOSIS — Z794 Long term (current) use of insulin: Secondary | ICD-10-CM | POA: Diagnosis not present

## 2022-04-19 DIAGNOSIS — Z1331 Encounter for screening for depression: Secondary | ICD-10-CM | POA: Diagnosis not present

## 2022-04-19 DIAGNOSIS — E119 Type 2 diabetes mellitus without complications: Secondary | ICD-10-CM | POA: Diagnosis not present

## 2022-04-19 DIAGNOSIS — N186 End stage renal disease: Secondary | ICD-10-CM | POA: Diagnosis not present

## 2022-04-19 DIAGNOSIS — I10 Essential (primary) hypertension: Secondary | ICD-10-CM | POA: Diagnosis not present

## 2022-04-19 DIAGNOSIS — I441 Atrioventricular block, second degree: Secondary | ICD-10-CM | POA: Diagnosis not present

## 2022-04-19 DIAGNOSIS — Z992 Dependence on renal dialysis: Secondary | ICD-10-CM | POA: Diagnosis not present

## 2022-04-19 DIAGNOSIS — Z23 Encounter for immunization: Secondary | ICD-10-CM | POA: Diagnosis not present

## 2022-04-19 DIAGNOSIS — D631 Anemia in chronic kidney disease: Secondary | ICD-10-CM | POA: Diagnosis not present

## 2022-04-19 DIAGNOSIS — Z1389 Encounter for screening for other disorder: Secondary | ICD-10-CM | POA: Diagnosis not present

## 2022-04-19 DIAGNOSIS — N250 Renal osteodystrophy: Secondary | ICD-10-CM | POA: Diagnosis not present

## 2022-04-19 DIAGNOSIS — E1165 Type 2 diabetes mellitus with hyperglycemia: Secondary | ICD-10-CM | POA: Diagnosis not present

## 2022-04-19 DIAGNOSIS — D509 Iron deficiency anemia, unspecified: Secondary | ICD-10-CM | POA: Diagnosis not present

## 2022-04-19 DIAGNOSIS — I2510 Atherosclerotic heart disease of native coronary artery without angina pectoris: Secondary | ICD-10-CM | POA: Diagnosis not present

## 2022-04-19 NOTE — Patient Outreach (Signed)
  Care Coordination   Follow Up Visit Note   04/19/2022 Name: [name] [name] MRN: [id number] DOB: 02-15-1951  [name] [name] is a 71 y.o. year old male who sees [name], [name], MD for primary care. I spoke with  [name] [name] by phone today.  What matters to the patients health and wellness today?  Keep working on wt loss to be eligible for kidney by the end of the year.    Goals Addressed               This Visit's Progress     Patient Stated     I need to lose wt to be eligible for possible kidney transplant. Goal is 195#, current wt 219#, has already lost 16# in last 3 months. (pt-stated)        Care Coordination Interventions: Assessed: [name] [name] weighed 217.5# at dialysis this am and 221# at his PCP office visit this am.We liked the 217.5 and are going with that which means he has lost 1.5#. He continues to watch what he is eating (carbs and serving sizes) and walking a mile at least 3 times a week! [name] [name]! Encouraged his continued efforts. Discussed possibly having reached a plateau on this loss goal and he just has to keep working at it and not give up.         SDOH assessments and interventions completed:  No   Care Coordination Interventions Activated:  Yes  Care Coordination Interventions:  Yes, provided   Follow up plan: Follow up call scheduled for 1 month.    Encounter Outcome:  Pt. Visit Completed   [name] [name] Neither, MSN, [facility] Gerontological Nurse Practitioner [facility] Care Management [phone]

## 2022-04-20 DIAGNOSIS — N186 End stage renal disease: Secondary | ICD-10-CM | POA: Diagnosis not present

## 2022-04-20 DIAGNOSIS — Z992 Dependence on renal dialysis: Secondary | ICD-10-CM | POA: Diagnosis not present

## 2022-04-20 DIAGNOSIS — N250 Renal osteodystrophy: Secondary | ICD-10-CM | POA: Diagnosis not present

## 2022-04-20 DIAGNOSIS — D631 Anemia in chronic kidney disease: Secondary | ICD-10-CM | POA: Diagnosis not present

## 2022-04-20 DIAGNOSIS — D509 Iron deficiency anemia, unspecified: Secondary | ICD-10-CM | POA: Diagnosis not present

## 2022-04-21 ENCOUNTER — Encounter: Payer: Self-pay | Admitting: *Deleted

## 2022-04-21 DIAGNOSIS — D631 Anemia in chronic kidney disease: Secondary | ICD-10-CM | POA: Diagnosis not present

## 2022-04-21 DIAGNOSIS — N250 Renal osteodystrophy: Secondary | ICD-10-CM | POA: Diagnosis not present

## 2022-04-21 DIAGNOSIS — Z992 Dependence on renal dialysis: Secondary | ICD-10-CM | POA: Diagnosis not present

## 2022-04-21 DIAGNOSIS — D509 Iron deficiency anemia, unspecified: Secondary | ICD-10-CM | POA: Diagnosis not present

## 2022-04-21 DIAGNOSIS — N186 End stage renal disease: Secondary | ICD-10-CM | POA: Diagnosis not present

## 2022-04-22 DIAGNOSIS — D631 Anemia in chronic kidney disease: Secondary | ICD-10-CM | POA: Diagnosis not present

## 2022-04-22 DIAGNOSIS — N250 Renal osteodystrophy: Secondary | ICD-10-CM | POA: Diagnosis not present

## 2022-04-22 DIAGNOSIS — D509 Iron deficiency anemia, unspecified: Secondary | ICD-10-CM | POA: Diagnosis not present

## 2022-04-22 DIAGNOSIS — Z992 Dependence on renal dialysis: Secondary | ICD-10-CM | POA: Diagnosis not present

## 2022-04-22 DIAGNOSIS — N186 End stage renal disease: Secondary | ICD-10-CM | POA: Diagnosis not present

## 2022-04-23 DIAGNOSIS — D631 Anemia in chronic kidney disease: Secondary | ICD-10-CM | POA: Diagnosis not present

## 2022-04-23 DIAGNOSIS — N250 Renal osteodystrophy: Secondary | ICD-10-CM | POA: Diagnosis not present

## 2022-04-23 DIAGNOSIS — N186 End stage renal disease: Secondary | ICD-10-CM | POA: Diagnosis not present

## 2022-04-23 DIAGNOSIS — D509 Iron deficiency anemia, unspecified: Secondary | ICD-10-CM | POA: Diagnosis not present

## 2022-04-23 DIAGNOSIS — Z992 Dependence on renal dialysis: Secondary | ICD-10-CM | POA: Diagnosis not present

## 2022-04-24 DIAGNOSIS — D631 Anemia in chronic kidney disease: Secondary | ICD-10-CM | POA: Diagnosis not present

## 2022-04-24 DIAGNOSIS — Z992 Dependence on renal dialysis: Secondary | ICD-10-CM | POA: Diagnosis not present

## 2022-04-24 DIAGNOSIS — D509 Iron deficiency anemia, unspecified: Secondary | ICD-10-CM | POA: Diagnosis not present

## 2022-04-24 DIAGNOSIS — N250 Renal osteodystrophy: Secondary | ICD-10-CM | POA: Diagnosis not present

## 2022-04-24 DIAGNOSIS — N186 End stage renal disease: Secondary | ICD-10-CM | POA: Diagnosis not present

## 2022-04-25 DIAGNOSIS — N186 End stage renal disease: Secondary | ICD-10-CM | POA: Diagnosis not present

## 2022-04-25 DIAGNOSIS — N250 Renal osteodystrophy: Secondary | ICD-10-CM | POA: Diagnosis not present

## 2022-04-25 DIAGNOSIS — Z992 Dependence on renal dialysis: Secondary | ICD-10-CM | POA: Diagnosis not present

## 2022-04-25 DIAGNOSIS — D631 Anemia in chronic kidney disease: Secondary | ICD-10-CM | POA: Diagnosis not present

## 2022-04-25 DIAGNOSIS — D509 Iron deficiency anemia, unspecified: Secondary | ICD-10-CM | POA: Diagnosis not present

## 2022-04-26 DIAGNOSIS — N186 End stage renal disease: Secondary | ICD-10-CM | POA: Diagnosis not present

## 2022-04-26 DIAGNOSIS — D631 Anemia in chronic kidney disease: Secondary | ICD-10-CM | POA: Diagnosis not present

## 2022-04-26 DIAGNOSIS — D509 Iron deficiency anemia, unspecified: Secondary | ICD-10-CM | POA: Diagnosis not present

## 2022-04-26 DIAGNOSIS — N250 Renal osteodystrophy: Secondary | ICD-10-CM | POA: Diagnosis not present

## 2022-04-26 DIAGNOSIS — Z992 Dependence on renal dialysis: Secondary | ICD-10-CM | POA: Diagnosis not present

## 2022-04-27 DIAGNOSIS — N250 Renal osteodystrophy: Secondary | ICD-10-CM | POA: Diagnosis not present

## 2022-04-27 DIAGNOSIS — D631 Anemia in chronic kidney disease: Secondary | ICD-10-CM | POA: Diagnosis not present

## 2022-04-27 DIAGNOSIS — Z992 Dependence on renal dialysis: Secondary | ICD-10-CM | POA: Diagnosis not present

## 2022-04-27 DIAGNOSIS — D509 Iron deficiency anemia, unspecified: Secondary | ICD-10-CM | POA: Diagnosis not present

## 2022-04-27 DIAGNOSIS — N186 End stage renal disease: Secondary | ICD-10-CM | POA: Diagnosis not present

## 2022-04-28 DIAGNOSIS — N186 End stage renal disease: Secondary | ICD-10-CM | POA: Diagnosis not present

## 2022-04-28 DIAGNOSIS — Z992 Dependence on renal dialysis: Secondary | ICD-10-CM | POA: Diagnosis not present

## 2022-04-28 DIAGNOSIS — D631 Anemia in chronic kidney disease: Secondary | ICD-10-CM | POA: Diagnosis not present

## 2022-04-28 DIAGNOSIS — N250 Renal osteodystrophy: Secondary | ICD-10-CM | POA: Diagnosis not present

## 2022-04-28 DIAGNOSIS — D509 Iron deficiency anemia, unspecified: Secondary | ICD-10-CM | POA: Diagnosis not present

## 2022-04-29 ENCOUNTER — Other Ambulatory Visit: Payer: Self-pay | Admitting: Nurse Practitioner

## 2022-04-29 DIAGNOSIS — N250 Renal osteodystrophy: Secondary | ICD-10-CM | POA: Diagnosis not present

## 2022-04-29 DIAGNOSIS — Z992 Dependence on renal dialysis: Secondary | ICD-10-CM | POA: Diagnosis not present

## 2022-04-29 DIAGNOSIS — D509 Iron deficiency anemia, unspecified: Secondary | ICD-10-CM | POA: Diagnosis not present

## 2022-04-29 DIAGNOSIS — D631 Anemia in chronic kidney disease: Secondary | ICD-10-CM | POA: Diagnosis not present

## 2022-04-29 DIAGNOSIS — N186 End stage renal disease: Secondary | ICD-10-CM | POA: Diagnosis not present

## 2022-04-30 DIAGNOSIS — D509 Iron deficiency anemia, unspecified: Secondary | ICD-10-CM | POA: Diagnosis not present

## 2022-04-30 DIAGNOSIS — Z992 Dependence on renal dialysis: Secondary | ICD-10-CM | POA: Diagnosis not present

## 2022-04-30 DIAGNOSIS — N186 End stage renal disease: Secondary | ICD-10-CM | POA: Diagnosis not present

## 2022-04-30 DIAGNOSIS — D631 Anemia in chronic kidney disease: Secondary | ICD-10-CM | POA: Diagnosis not present

## 2022-04-30 DIAGNOSIS — N250 Renal osteodystrophy: Secondary | ICD-10-CM | POA: Diagnosis not present

## 2022-05-01 DIAGNOSIS — D631 Anemia in chronic kidney disease: Secondary | ICD-10-CM | POA: Diagnosis not present

## 2022-05-01 DIAGNOSIS — N186 End stage renal disease: Secondary | ICD-10-CM | POA: Diagnosis not present

## 2022-05-01 DIAGNOSIS — Z992 Dependence on renal dialysis: Secondary | ICD-10-CM | POA: Diagnosis not present

## 2022-05-01 DIAGNOSIS — N250 Renal osteodystrophy: Secondary | ICD-10-CM | POA: Diagnosis not present

## 2022-05-01 DIAGNOSIS — D509 Iron deficiency anemia, unspecified: Secondary | ICD-10-CM | POA: Diagnosis not present

## 2022-05-02 ENCOUNTER — Ambulatory Visit (INDEPENDENT_AMBULATORY_CARE_PROVIDER_SITE_OTHER): Payer: Medicare Other | Admitting: Pulmonary Disease

## 2022-05-02 ENCOUNTER — Encounter: Payer: Self-pay | Admitting: Pulmonary Disease

## 2022-05-02 VITALS — BP 134/82 | HR 90 | Temp 98.5°F | Ht 64.0 in | Wt 217.4 lb

## 2022-05-02 DIAGNOSIS — G4733 Obstructive sleep apnea (adult) (pediatric): Secondary | ICD-10-CM | POA: Diagnosis not present

## 2022-05-02 DIAGNOSIS — I25118 Atherosclerotic heart disease of native coronary artery with other forms of angina pectoris: Secondary | ICD-10-CM | POA: Diagnosis not present

## 2022-05-02 DIAGNOSIS — I2111 ST elevation (STEMI) myocardial infarction involving right coronary artery: Secondary | ICD-10-CM | POA: Diagnosis not present

## 2022-05-02 DIAGNOSIS — N250 Renal osteodystrophy: Secondary | ICD-10-CM | POA: Diagnosis not present

## 2022-05-02 DIAGNOSIS — D631 Anemia in chronic kidney disease: Secondary | ICD-10-CM | POA: Diagnosis not present

## 2022-05-02 DIAGNOSIS — Z992 Dependence on renal dialysis: Secondary | ICD-10-CM | POA: Diagnosis not present

## 2022-05-02 DIAGNOSIS — N186 End stage renal disease: Secondary | ICD-10-CM | POA: Diagnosis not present

## 2022-05-02 DIAGNOSIS — D509 Iron deficiency anemia, unspecified: Secondary | ICD-10-CM | POA: Diagnosis not present

## 2022-05-02 NOTE — Assessment & Plan Note (Signed)
He reports what sounds like angina on exertion.  Reviewed last cardiology consultation plan was to increase Imdur to 60 twice a day. I asked him to do that for today until his visit for tomorrow

## 2022-05-02 NOTE — Assessment & Plan Note (Signed)
We discussed lowering humidity on CPAP, he will contact DME.  He can also try using CPAP in the meantime without water or with minimal water.  CPAP was effective when he used this and certainly helped improve his daytime somnolence and fatigue We will discussed benefits from a cardiovascular standpoint  Weight loss encouraged, compliance with goal of at least 4-6 hrs every night is the expectation. Advised against medications with sedative side effects Cautioned against driving when sleepy - understanding that sleepiness will vary on a day to day basis

## 2022-05-02 NOTE — Patient Instructions (Signed)
  X decrease humidity on Luna machine - [employer] can try using without water or less water

## 2022-05-02 NOTE — Progress Notes (Signed)
   Subjective:    Patient ID: [name] [name], male    DOB: March 13, 1951, 71 y.o.   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  He smoked about 25 pack years before he quit in 2011 started on oxygen with exertion 10 years ago, wears as needed   He  obtained CPAP machine from [place] in 2023 He used this for a few months and download showed average pressure of 8 to 9 cm.  However the water started coming in the tubing and he has not used it for the past few weeks. He is accompanied by his wife today who reports that he has started snoring again he is not resting well. He reports dyspnea and angina on exertion, he has a cardiology appointment tomorrow.  Reviewed last cardiology consultation, Ranexa was stopped.  Imdur was started for angina and plan was to increase dose if persisted. Reviewed EP consultation, he has nocturnal third-degree AV block     Significant tests/ events reviewed  CT chest/abdomen/pelvis 08/15/2021 clear lungs 08/2020 HST >> AHI 21/h   PFTs 12/2019 ratio 74 FEV1 of 3 6 L (57% predicted), normal (pseudonormal) lung volumes, normal diffusion capacity.  Review of Systems neg for any significant sore throat, dysphagia, itching, sneezing, nasal congestion or excess/ purulent secretions, fever, chills, sweats, unintended wt loss, pleuritic or exertional cp, hempoptysis, orthopnea pnd or change in chronic leg swelling. Also denies presyncope, palpitations, heartburn, abdominal pain, nausea, vomiting, diarrhea or change in bowel or urinary habits, dysuria,hematuria, rash, arthralgias, visual complaints, headache, numbness  weakness or ataxia.     Objective:   Physical Exam   Gen. Pleasant, obese, in no distress ENT - no lesions, no post nasal drip Neck: No JVD, no thyromegaly, no carotid bruits Lungs: no use of accessory muscles, no dullness to percussion, decreased without rales or rhonchi  Cardiovascular: Rhythm regular, heart sounds  normal, no murmurs or gallops, no peripheral edema Musculoskeletal: No deformities, no cyanosis or clubbing , no tremors        Assessment & Plan:

## 2022-05-03 ENCOUNTER — Ambulatory Visit: Payer: Medicare Other | Attending: Internal Medicine | Admitting: Internal Medicine

## 2022-05-03 ENCOUNTER — Encounter: Payer: Self-pay | Admitting: Internal Medicine

## 2022-05-03 VITALS — BP 83/50 | HR 80 | Ht 64.0 in | Wt 220.6 lb

## 2022-05-03 DIAGNOSIS — I959 Hypotension, unspecified: Secondary | ICD-10-CM

## 2022-05-03 DIAGNOSIS — Z992 Dependence on renal dialysis: Secondary | ICD-10-CM | POA: Diagnosis not present

## 2022-05-03 DIAGNOSIS — I2111 ST elevation (STEMI) myocardial infarction involving right coronary artery: Secondary | ICD-10-CM

## 2022-05-03 DIAGNOSIS — D631 Anemia in chronic kidney disease: Secondary | ICD-10-CM | POA: Diagnosis not present

## 2022-05-03 DIAGNOSIS — G4733 Obstructive sleep apnea (adult) (pediatric): Secondary | ICD-10-CM | POA: Diagnosis not present

## 2022-05-03 DIAGNOSIS — N186 End stage renal disease: Secondary | ICD-10-CM | POA: Diagnosis not present

## 2022-05-03 DIAGNOSIS — I25118 Atherosclerotic heart disease of native coronary artery with other forms of angina pectoris: Secondary | ICD-10-CM

## 2022-05-03 DIAGNOSIS — N250 Renal osteodystrophy: Secondary | ICD-10-CM | POA: Diagnosis not present

## 2022-05-03 DIAGNOSIS — D509 Iron deficiency anemia, unspecified: Secondary | ICD-10-CM | POA: Diagnosis not present

## 2022-05-03 NOTE — Patient Instructions (Signed)
Medication Instructions:  STOP losartan  *If you need a refill on your cardiac medications before your next appointment, please call your pharmacy*    Follow-Up: At [facility], you and your health needs are our priority.  As part of our continuing mission to provide you with exceptional heart care, we have created designated Provider Care Teams.  These Care Teams include your primary Cardiologist (physician) and Advanced Practice Providers (APPs -  Physician Assistants and Nurse Practitioners) who all work together to provide you with the care you need, when you need it.  We recommend signing up for the patient portal called "MyChart".  Sign up information is provided on this After Visit Summary.  MyChart is used to connect with patients for Virtual Visits (Telemedicine).  Patients are able to view lab/test results, encounter notes, upcoming appointments, etc.  Non-urgent messages can be sent to your provider as well.   To learn more about what you can do with MyChart, go to [email or web address].    Your next appointment:    3 months with PA or NP

## 2022-05-04 DIAGNOSIS — N250 Renal osteodystrophy: Secondary | ICD-10-CM | POA: Diagnosis not present

## 2022-05-04 DIAGNOSIS — Z992 Dependence on renal dialysis: Secondary | ICD-10-CM | POA: Diagnosis not present

## 2022-05-04 DIAGNOSIS — N186 End stage renal disease: Secondary | ICD-10-CM | POA: Diagnosis not present

## 2022-05-04 DIAGNOSIS — D509 Iron deficiency anemia, unspecified: Secondary | ICD-10-CM | POA: Diagnosis not present

## 2022-05-04 DIAGNOSIS — D631 Anemia in chronic kidney disease: Secondary | ICD-10-CM | POA: Diagnosis not present

## 2022-05-04 NOTE — Progress Notes (Signed)
OFFICE NOTE  Chief Complaint:  Follow-up  Primary Care Physician: Carrolyn Meiers, MD  HPI:  [name] [name] Is a pleasant 71 year old male with a history of insulin-dependent diabetes, dyslipidemia, and hypertension. He's been a diabetic since 1999. He's been on insulin for only 3-4 years. He was a former patient of Dr. Rollene Fare who ordered a stress test in 2014. This demonstrated reversible inferoapical ischemia and was read as moderate risk. He ultimately underwent heart catheterization by Dr. Ellyn Hack on 03/28/2013 which demonstrated a small, possibly minimal aneurysmal bleb in the LAD but no significant disease. There is a dominant RCA with no disease. Essentially there was no significant obstructive coronary disease. At the time his EKG did show inferior T-wave abnormalities and very small R waves but no evidence for Q waves. Today he is referred back for abnormalities EKG which directly compared to his old EKG shows no significant change. There is an IVCD and very small R waves inferiorly, which the computer may have this read as inferior infarct. I do not see any significant change compared to his previous studies. He denies any new or worsening chest pain or shortness of breath.  [name] [name] returns today for follow-up. Overall he is feeling fairly well. He denies any chest pain or shortness of breath. Blood pressure is slightly elevated to 162/94, however recheck was 140/84. Please not been able to lose much weight. He is on chronic Plavix therapy due to what was thought to be plaque rupture but did not require stent by catheterization in 2014. This was a preferred agent over aspirin and he's remained on that, but certainly could come off of that if he were to undergo any procedures he is contemplating a penile implant for erectile dysfunction.  05/01/2016  [name] [name] was seen today in follow-up. He recently saw [name], NP, in April 2016 for follow-up. Blood pressure was  somewhat elevated at the time. It was not is ambulatory due to problems with his knee. There was a discussion about stopping Plavix and keeping him on aspirin however he remained on Plavix. Unfortunately he has stage IV chronic kidney disease and there is discussion about placement of a fistula. He scheduled for upper extremity Dopplers at the end of this month and follow-up with Dr. Oneida Alar. He had a number of questions today regarding dialysis and his fistula, but I reassured him based on his creatinine that he should consider this even if his numbers seem to have stabilized. He denies any chest pain or worsening shortness of breath. He did get new CPAP equipment but reports intermittent compliance with it. He still has some daytime fatigue.  10/27/2016  [name] [name] was seen today in follow-up. He underwent recent placement of a fistula left upper arm. This has a positive thrill today. Blood pressure is elevated somewhat 152/88. Recently his creatinine was increased up to 5.08 which is progression from 3.74 in December. I suspect he will have to start on dialysis soon. Otherwise hemoglobin A1c is stable around 7.0. He is cholesterol also has looked reasonably well controlled with total cholesterol 146, HDL-C 36, LDL-C 72 and triglycerides 190.  11/13/2017  [name] [name] was seen today for routine follow-up.  Overall he is doing very well.  He denies any chest pain or worsening shortness of breath.  His creatinine remains around 5.7.  This is been fairly stable and he is managed to remain off dialysis.  He does have left upper extremity fistula.  Hemoglobin A1c  is well-controlled at 6.2 and is followed by Dr. Dorris Fetch in [place].  His total cholesterol was 110, HDL 38, LDL 54 triglycerides 101, demonstrating excellent control on simvastatin.  Blood pressure is at goal today 136/78.  11/14/2019  [name] [name] is seen today in follow-up.  He continues on home peritoneal dialysis.  Blood pressure was noted to be  very low today at 86/56.  He denies any fevers or chills or other signs or symptoms of infection.  His dialysis center had recommended stopping his hydralazine.  Despite this his blood pressure remains low.  He is also on amlodipine, metoprolol and losartan.  He reports some fatigue but denies chest pain or shortness of breath.  01/28/2020  [name] [name] is seen today in follow-up.  Overall he continues to do well.  He is on PD which he is now doing at night.  He is overdue for a lipid profile which we will order.  He says he stopped Plavix and aspirin.  He has multiple indications for this including diabetes and prior history of coronary disease.  He denies any chest pain or shortness of breath.  Blood pressures well controlled today 120/70 and at times at home can be as low as the 16R systolic but he is asymptomatic with this.  03/01/2021  [name] [name] returns today for follow-up.  Overall he says he is doing well.  He continues with home peritoneal dialysis.  Denies any chest pain.  He has had some worsening shortness of breath but primarily I suspect this is due to wheezing and chronic lung disease.  He has a follow-up with pulmonary in September.  Cholesterol is fairly low with total 117, triglycerides 157, HDL 27 and LDL 63.  Blood pressure appears to be well controlled today 128/88.  EKG is unchanged showing sinus rhythm with first-degree AV block and nonspecific T wave changes.  12/01/2021  [name] [name] returns today for follow-up.  Overall he says he is doing well.  Back in January he underwent cardiac catheterization and having a drug-eluting stent to the proximal RCA.  There were some mid and distal vessel lesions that seem to improve with more flow.  He had some residual plaque in the LAD and circumflex marginal branch as well.  Plan was for 1 year of dual antiplatelet therapy with aspirin and Brilinta.  He seems to be doing well with that.  He reports being taken off of metoprolol due to lower  blood pressures at dialysis.  He is undergoing cardiac rehab seems to be doing well.  He denies any chest pain.  Recent lipids are well controlled with total cholesterol 99, HDL 29 current triglycerides 148 and LDL 47.  A1c is 6.6%.  05/04/2022  [name] [name] returns today for follow-up.  He was seen in July by Caron Presume, PA-C, this was follow-up after he had presented with chest pain and underwent drug-eluting stenting x2 to the mid RCA in June 2023 and then unfortunately had in-stent restenosis of the RCA treated with a drug-eluting stent in July.  Subsequently he has had some continued anginal symptoms.  He was initially placed on high-dose Imdur 120 mg daily and Ranexa but had some lightheadedness and vomiting.  The Ranexa was stopped and the Imdur was decreased to 60 mg daily.  He was noted to have a first-degree AV block and Mobitz 1 AV block and underwent a 13-day ZIO monitor which showed some high degree AV block up to 49 seconds.  He was referred to  electrophysiology and seen by Dr. Caryl Comes who felt that this was all nocturnal and probably related to sleep apnea.  [name] [name] reports variable compliance with his CPAP and has had issues with his equipment recently but is working with pulmonary to get that reestablished.  Pacemaker was not recommended.  Today he says he still gets some discomfort in his chest with exertion.  This sounds like stable angina.  He continues to do home peritoneal dialysis.  What was unusual was his blood pressure today at 83/50.  He said more recently he has seen lower blood pressures which is concerning.  He is also on a high dose of torsemide 100 mg daily by nephrology.  PMHx:  Past Medical History:  Diagnosis Date   Anemia    Arthritis    Asthma    Bell palsy    CAD (coronary artery disease)    a. 2014 MV: abnl w/ infap ischemia; b. 03/2013 Cath: aneurysmal bleb in the LAD w/ otw nonobs dzs-->Med Rx.   Chronic back pain    Chronic knee pain    a. 09/2015 s/p R  TKA.   Chronic pain    Chronic shoulder pain    Chronic sinusitis    COPD (chronic obstructive pulmonary disease) ([place])    Diabetes mellitus without complication ([place])    type II    ESRD on peritoneal dialysis ([place])    on peritoneal dialysis, [facility]   Essential hypertension    GERD (gastroesophageal reflux disease)    Gout    Gout    Hepatomegaly    noted on noncontrast CT 2015   History of hiatal hernia    Hyperlipidemia    Lateral meniscus tear    Obesity    Truncal   Obstructive sleep apnea    does not use cpap    On home oxygen therapy    uses 2l when is going somewhere per patient    PUD (peptic ulcer disease)    remote, reports f/u EGD about 8 years ago unremarkable    Reactive airway disease    related to exposure to chemical during 9/11   Sinusitis    Vitamin D deficiency     Past Surgical History:  Procedure Laterality Date   ASAD LT SHOULDER  12/15/2008   left shoulder   AV FISTULA PLACEMENT Left 08/09/2016   Procedure: BRACHIOCEPHALIC ARTERIOVENOUS (AV) FISTULA CREATION LEFT ARM;  Surgeon: [name], MD;  Location: [facility];  Service: Vascular;  Laterality: Left;   CAPD INSERTION N/A 10/07/2018   Procedure: LAPAROSCOPIC PERITONEAL CATHETER PLACEMENT;  Surgeon: Mickeal Skinner, MD;  [facility];  Service: General;  Laterality: N/A;   CATARACT EXTRACTION W/PHACO Left 03/28/2016   Procedure: CATARACT EXTRACTION PHACO AND INTRAOCULAR LENS PLACEMENT LEFT EYE;  Surgeon: [name], MD;  Location: [facility];  Service: Ophthalmology;  Laterality: Left;  CDE: 4.77   CATARACT EXTRACTION W/PHACO Right 04/11/2016   Procedure: CATARACT EXTRACTION PHACO AND INTRAOCULAR LENS PLACEMENT RIGHT EYE; CDE:  4.74;  Surgeon: [name], MD;  Location: [facility];  Service: Ophthalmology;  Laterality: Right;   COLONOSCOPY  10/15/2008   Fields: Rectal polyp obliterated, not retrieved, hemorrhoids, single ascending colon diverticulum near the CV. Next colonoscopy  April 2020   COLONOSCOPY N/A 12/25/2014   SLF: 1. Colorectal polyps (2) removed 2. Small internal hemorrhoids 3. the left colon is severely redundant. hyperplastic polyps   CORONARY STENT INTERVENTION N/A 07/25/2021   Procedure: CORONARY STENT INTERVENTION;  Surgeon:  Sherren Mocha, MD;  Location: [facility];  Service: Cardiovascular;  Laterality: N/A;   CORONARY STENT INTERVENTION N/A 12/26/2021   Procedure: CORONARY STENT INTERVENTION;  Surgeon: Nelva Bush, MD;  Location: [facility];  Service: Cardiovascular;  Laterality: N/A;   CORONARY STENT INTERVENTION N/A 01/20/2022   Procedure: CORONARY STENT INTERVENTION;  Surgeon: Sherren Mocha, MD;  Location: [place];  Service: Cardiovascular;  Laterality: N/A;   DOPPLER ECHOCARDIOGRAPHY     ESOPHAGOGASTRODUODENOSCOPY N/A 12/25/2014   SLF: 1. Anemia most likely due to CRI, gastritis, gastric polyps 2. Moderate non-erosive gastriits and mild duodenitis.  3.TWo large gstric polyps removed.    EYE SURGERY  12/22/2010   tear duct probing-[place]   FOREIGN BODY REMOVAL  03/29/2011   Procedure: REMOVAL FOREIGN BODY EXTREMITY;  Surgeon: Arther Abbott, MD;  [facility];  Service: Orthopedics;  Laterality: Right;  Removal Foreign Body Right Thumb   IR FLUORO GUIDE CV LINE RIGHT  08/06/2018   IR US GUIDE VASC ACCESS RIGHT  08/06/2018   KNEE ARTHROSCOPY  10/16/2007   left   KNEE ARTHROSCOPY WITH LATERAL MENISECTOMY Right 10/14/2015   Procedure: LEFT KNEE ARTHROSCOPY WITH PARTIAL LATERAL MENISECTOMY;  Surgeon: Carole Civil, MD;  [facility];  Service: Orthopedics;  Laterality: Right;   LEFT HEART CATH AND CORONARY ANGIOGRAPHY N/A 07/25/2021   Procedure: LEFT HEART CATH AND CORONARY ANGIOGRAPHY;  Surgeon: Sherren Mocha, MD;  Location: [place] [facility];  Service: Cardiovascular;  Laterality: N/A;   LEFT HEART CATH AND CORONARY ANGIOGRAPHY N/A 12/26/2021   Procedure: LEFT HEART CATH AND CORONARY ANGIOGRAPHY;   Surgeon: Nelva Bush, MD;  Location: [place] [facility];  Service: Cardiovascular;  Laterality: N/A;   LEFT HEART CATH AND CORONARY ANGIOGRAPHY N/A 01/20/2022   Procedure: LEFT HEART CATH AND CORONARY ANGIOGRAPHY;  Surgeon: Sherren Mocha, MD;  Location: [place] [facility];  Service: Cardiovascular;  Laterality: N/A;   LEFT HEART CATHETERIZATION WITH CORONARY ANGIOGRAM N/A 03/28/2013   Procedure: LEFT HEART CATHETERIZATION WITH CORONARY ANGIOGRAM;  Surgeon: Leonie Man, MD;  Location: [facility];  Service: Cardiovascular;  Laterality: N/A;   NM MYOVIEW LTD     PENILE PROSTHESIS IMPLANT N/A 08/16/2015   Procedure: PENILE PROTHESIS INFLATABLE, three piece, Excisional biopsy of Penile ulcer, Penile molding;  Surgeon: Carolan Clines, MD;  [facility];  Service: Urology;  Laterality: N/A;   PENILE PROSTHESIS IMPLANT N/A 12/24/2017   Procedure: REMOVAL AND  REPLACEMENT  COLOPLAST PENILE PROSTHESIS;  Surgeon: Lucas Mallow, MD;  [facility];  Service: Urology;  Laterality: N/A;   QUADRICEPS TENDON REPAIR  07/21/2011   Procedure: REPAIR QUADRICEP TENDON;  Surgeon: Arther Abbott, MD;  [facility];  Service: Orthopedics;  Laterality: Right;   TOENAIL EXCISION     removed [id number]   UMBILICAL HERNIA REPAIR  07/17/2005   [place]    FAMHx:  Family History  Problem Relation Age of Onset   Hypertension Mother        MI   Cancer Mother        breast    Diabetes Mother    Diabetes Father    Hypertension Father    Hypertension Sister    Diabetes Sister    Arthritis Other    Asthma Other    Lung disease Other    Anesthesia problems Neg Hx    Hypotension Neg Hx    Malignant hyperthermia Neg Hx    Pseudochol deficiency Neg Hx  Colon cancer Neg Hx     SOCHx:   reports that he quit smoking about 12 years ago. His smoking use included cigarettes. He has a 25.00 pack-year smoking history. He has never used smokeless tobacco. He reports that he does not  currently use alcohol. He reports current drug use. Drug: Marijuana.  ALLERGIES:  Allergies  Allergen Reactions   Opana [Oxymorphone Hcl] Itching   Oxymorphone Itching   Tramadol Itching    ROS: A comprehensive review of systems was negative.  HOME MEDS: Current Outpatient Medications  Medication Sig Dispense Refill   albuterol (VENTOLIN HFA) 108 (90 Base) MCG/ACT inhaler Inhale 2 puffs into the lungs every 6 (six) hours as needed for wheezing or shortness of breath. 18 g 11   aspirin EC 81 MG tablet Take 81 mg by mouth daily. Swallow whole.     calcitRIOL (ROCALTROL) 0.25 MCG capsule Take 0.25 mcg by mouth daily.     DROPLET PEN NEEDLES 31G X 5 MM MISC USE TO INJECT INSULIN DAILY AS DIRECTED 100 each 1   insulin glargine (LANTUS SOLOSTAR) 100 UNIT/ML Solostar Pen Inject 22 Units into the skin at bedtime. 30 mL 0   isosorbide mononitrate (IMDUR) 60 MG 24 hr tablet Take 1 tablet (60 mg total) by mouth daily. 30 tablet 2   nitroGLYCERIN (NITROSTAT) 0.4 MG SL tablet Place 1 tablet (0.4 mg total) under the tongue every 5 (five) minutes as needed for chest pain. 30 tablet 1   ONETOUCH ULTRA test strip USE TO CHECK BLOOD SUGAR TWICE DAILY 200 strip 2   pantoprazole (PROTONIX) 40 MG tablet Take 1 tablet (40 mg total) by mouth 2 (two) times daily before a meal. 60 tablet 5   sevelamer (RENAGEL) 800 MG tablet Take 2,400 mg by mouth 3 (three) times daily with meals.     simvastatin (ZOCOR) 20 MG tablet Take 20 mg by mouth every morning.     ticagrelor (BRILINTA) 90 MG TABS tablet TAKE 1 TABLET(90 MG) BY MOUTH TWICE DAILY 180 tablet 3   torsemide (DEMADEX) 100 MG tablet Take 100 mg by mouth daily.     TRADJENTA 5 MG TABS tablet TAKE 1 TABLET EVERY DAY 90 tablet 10   Vitamin D, Ergocalciferol, (DRISDOL) 1.25 MG (50000 UNIT) CAPS capsule Take 50,000 Units by mouth every 7 (seven) days. Monday     No current facility-administered medications for this visit.    LABS/IMAGING: No results found for  this or any previous visit (from the past 48 hour(s)). No results found.  VITALS: BP (!) 83/50   Pulse 80   Ht '5\' 4"'$  (1.626 m)   Wt 220 lb 9.6 oz (100.1 kg)   SpO2 97%   BMI 37.87 kg/m   EXAM: General appearance: alert and no distress Neck: no carotid bruit and no JVD Lungs: bilateral end-expiratory wheezes Heart: regular rate and rhythm, S1, S2 normal, no murmur, click, rub or gallop, mild tenderness to palpation over the anterior chest wall Abdomen: soft, non-tender; bowel sounds normal; no masses,  no organomegaly and morbidly obse Extremities: extremities normal, atraumatic, no cyanosis or edema Pulses: 2+ and symmetric Skin: Skin color, texture, turgor normal. No rashes or lesions Neurologic: Grossly normal Psyvh: Normal  EKG: Deferred  ASSESSMENT: Symptomatic hypotension Coronary artery disease with DES x2 to the mid RCA (12/2829), complicated by early in-stent restenosis of the mid RCA, treated with DES in 01/2022 Insulin-dependent diabetes Dyslipidemia-controlled Hypertension Morbid obesity Erectile dysfunction - s/p implant OSA on CPAP ESRD -  s/p LUE AV fistula, on PD QHS  PLAN: 1.   [name] [name] had recent unstable angina and drug-eluting stents to the RCA complicated by early in-stent restenosis which was also stented.  Subsequently he has had stable anginal symptoms but did not tolerate combination of high-dose nitrate and Ranexa.  This has been decreased and he seems somewhat better on the 60 mg of Imdur and no Ranexa.  Blood pressure was noted to be low today.  He said this has been consistent recently.  He denies any fever or chills or abdominal pain but does do home peritoneal dialysis.  He is only on 25 mg losartan for blood pressure.  I advise discontinuing that as there is also little evidence for this in patients with end-stage renal disease.  He should monitor blood pressure and reach out to [place] on this.  I am hesitant to increase his antianginal medications  further because of his side effects.   Plan close follow-up in 3 months with APP.  Pixie Casino, MD, [facility], [place] Director of the Advanced Lipid Disorders &  Cardiovascular Risk Reduction Clinic Diplomate of the American Board of Clinical Lipidology Attending Cardiologist  Direct Dial: [phone]  Fax: [phone]  Website:  [name] 05/04/2022, 8:29 AM

## 2022-05-05 ENCOUNTER — Other Ambulatory Visit: Payer: Self-pay | Admitting: Cardiovascular Disease

## 2022-05-05 DIAGNOSIS — N250 Renal osteodystrophy: Secondary | ICD-10-CM | POA: Diagnosis not present

## 2022-05-05 DIAGNOSIS — I25118 Atherosclerotic heart disease of native coronary artery with other forms of angina pectoris: Secondary | ICD-10-CM

## 2022-05-05 DIAGNOSIS — Z992 Dependence on renal dialysis: Secondary | ICD-10-CM | POA: Diagnosis not present

## 2022-05-05 DIAGNOSIS — N186 End stage renal disease: Secondary | ICD-10-CM | POA: Diagnosis not present

## 2022-05-05 DIAGNOSIS — I214 Non-ST elevation (NSTEMI) myocardial infarction: Secondary | ICD-10-CM

## 2022-05-05 DIAGNOSIS — D509 Iron deficiency anemia, unspecified: Secondary | ICD-10-CM | POA: Diagnosis not present

## 2022-05-05 DIAGNOSIS — D631 Anemia in chronic kidney disease: Secondary | ICD-10-CM | POA: Diagnosis not present

## 2022-05-06 DIAGNOSIS — D509 Iron deficiency anemia, unspecified: Secondary | ICD-10-CM | POA: Diagnosis not present

## 2022-05-06 DIAGNOSIS — D631 Anemia in chronic kidney disease: Secondary | ICD-10-CM | POA: Diagnosis not present

## 2022-05-06 DIAGNOSIS — N250 Renal osteodystrophy: Secondary | ICD-10-CM | POA: Diagnosis not present

## 2022-05-06 DIAGNOSIS — N186 End stage renal disease: Secondary | ICD-10-CM | POA: Diagnosis not present

## 2022-05-06 DIAGNOSIS — Z992 Dependence on renal dialysis: Secondary | ICD-10-CM | POA: Diagnosis not present

## 2022-05-07 DIAGNOSIS — Z992 Dependence on renal dialysis: Secondary | ICD-10-CM | POA: Diagnosis not present

## 2022-05-07 DIAGNOSIS — D631 Anemia in chronic kidney disease: Secondary | ICD-10-CM | POA: Diagnosis not present

## 2022-05-07 DIAGNOSIS — N250 Renal osteodystrophy: Secondary | ICD-10-CM | POA: Diagnosis not present

## 2022-05-07 DIAGNOSIS — N186 End stage renal disease: Secondary | ICD-10-CM | POA: Diagnosis not present

## 2022-05-07 DIAGNOSIS — D509 Iron deficiency anemia, unspecified: Secondary | ICD-10-CM | POA: Diagnosis not present

## 2022-05-08 DIAGNOSIS — D509 Iron deficiency anemia, unspecified: Secondary | ICD-10-CM | POA: Diagnosis not present

## 2022-05-08 DIAGNOSIS — N186 End stage renal disease: Secondary | ICD-10-CM | POA: Diagnosis not present

## 2022-05-08 DIAGNOSIS — N250 Renal osteodystrophy: Secondary | ICD-10-CM | POA: Diagnosis not present

## 2022-05-08 DIAGNOSIS — Z992 Dependence on renal dialysis: Secondary | ICD-10-CM | POA: Diagnosis not present

## 2022-05-08 DIAGNOSIS — D631 Anemia in chronic kidney disease: Secondary | ICD-10-CM | POA: Diagnosis not present

## 2022-05-09 DIAGNOSIS — N250 Renal osteodystrophy: Secondary | ICD-10-CM | POA: Diagnosis not present

## 2022-05-09 DIAGNOSIS — E1142 Type 2 diabetes mellitus with diabetic polyneuropathy: Secondary | ICD-10-CM | POA: Diagnosis not present

## 2022-05-09 DIAGNOSIS — D509 Iron deficiency anemia, unspecified: Secondary | ICD-10-CM | POA: Diagnosis not present

## 2022-05-09 DIAGNOSIS — N186 End stage renal disease: Secondary | ICD-10-CM | POA: Diagnosis not present

## 2022-05-09 DIAGNOSIS — B351 Tinea unguium: Secondary | ICD-10-CM | POA: Diagnosis not present

## 2022-05-09 DIAGNOSIS — Z992 Dependence on renal dialysis: Secondary | ICD-10-CM | POA: Diagnosis not present

## 2022-05-09 DIAGNOSIS — D631 Anemia in chronic kidney disease: Secondary | ICD-10-CM | POA: Diagnosis not present

## 2022-05-10 DIAGNOSIS — D509 Iron deficiency anemia, unspecified: Secondary | ICD-10-CM | POA: Diagnosis not present

## 2022-05-10 DIAGNOSIS — N186 End stage renal disease: Secondary | ICD-10-CM | POA: Diagnosis not present

## 2022-05-10 DIAGNOSIS — D631 Anemia in chronic kidney disease: Secondary | ICD-10-CM | POA: Diagnosis not present

## 2022-05-10 DIAGNOSIS — Z992 Dependence on renal dialysis: Secondary | ICD-10-CM | POA: Diagnosis not present

## 2022-05-10 DIAGNOSIS — N250 Renal osteodystrophy: Secondary | ICD-10-CM | POA: Diagnosis not present

## 2022-05-11 DIAGNOSIS — N250 Renal osteodystrophy: Secondary | ICD-10-CM | POA: Diagnosis not present

## 2022-05-11 DIAGNOSIS — D631 Anemia in chronic kidney disease: Secondary | ICD-10-CM | POA: Diagnosis not present

## 2022-05-11 DIAGNOSIS — D509 Iron deficiency anemia, unspecified: Secondary | ICD-10-CM | POA: Diagnosis not present

## 2022-05-11 DIAGNOSIS — N186 End stage renal disease: Secondary | ICD-10-CM | POA: Diagnosis not present

## 2022-05-11 DIAGNOSIS — Z992 Dependence on renal dialysis: Secondary | ICD-10-CM | POA: Diagnosis not present

## 2022-05-12 DIAGNOSIS — N186 End stage renal disease: Secondary | ICD-10-CM | POA: Diagnosis not present

## 2022-05-12 DIAGNOSIS — N250 Renal osteodystrophy: Secondary | ICD-10-CM | POA: Diagnosis not present

## 2022-05-12 DIAGNOSIS — D509 Iron deficiency anemia, unspecified: Secondary | ICD-10-CM | POA: Diagnosis not present

## 2022-05-12 DIAGNOSIS — D631 Anemia in chronic kidney disease: Secondary | ICD-10-CM | POA: Diagnosis not present

## 2022-05-12 DIAGNOSIS — Z992 Dependence on renal dialysis: Secondary | ICD-10-CM | POA: Diagnosis not present

## 2022-05-13 DIAGNOSIS — N186 End stage renal disease: Secondary | ICD-10-CM | POA: Diagnosis not present

## 2022-05-13 DIAGNOSIS — D631 Anemia in chronic kidney disease: Secondary | ICD-10-CM | POA: Diagnosis not present

## 2022-05-13 DIAGNOSIS — D509 Iron deficiency anemia, unspecified: Secondary | ICD-10-CM | POA: Diagnosis not present

## 2022-05-13 DIAGNOSIS — N250 Renal osteodystrophy: Secondary | ICD-10-CM | POA: Diagnosis not present

## 2022-05-13 DIAGNOSIS — Z992 Dependence on renal dialysis: Secondary | ICD-10-CM | POA: Diagnosis not present

## 2022-05-14 DIAGNOSIS — N250 Renal osteodystrophy: Secondary | ICD-10-CM | POA: Diagnosis not present

## 2022-05-14 DIAGNOSIS — Z992 Dependence on renal dialysis: Secondary | ICD-10-CM | POA: Diagnosis not present

## 2022-05-14 DIAGNOSIS — D631 Anemia in chronic kidney disease: Secondary | ICD-10-CM | POA: Diagnosis not present

## 2022-05-14 DIAGNOSIS — N186 End stage renal disease: Secondary | ICD-10-CM | POA: Diagnosis not present

## 2022-05-14 DIAGNOSIS — D509 Iron deficiency anemia, unspecified: Secondary | ICD-10-CM | POA: Diagnosis not present

## 2022-05-15 DIAGNOSIS — D509 Iron deficiency anemia, unspecified: Secondary | ICD-10-CM | POA: Diagnosis not present

## 2022-05-15 DIAGNOSIS — N250 Renal osteodystrophy: Secondary | ICD-10-CM | POA: Diagnosis not present

## 2022-05-15 DIAGNOSIS — Z992 Dependence on renal dialysis: Secondary | ICD-10-CM | POA: Diagnosis not present

## 2022-05-15 DIAGNOSIS — N186 End stage renal disease: Secondary | ICD-10-CM | POA: Diagnosis not present

## 2022-05-15 DIAGNOSIS — D631 Anemia in chronic kidney disease: Secondary | ICD-10-CM | POA: Diagnosis not present

## 2022-05-16 DIAGNOSIS — Z992 Dependence on renal dialysis: Secondary | ICD-10-CM | POA: Diagnosis not present

## 2022-05-16 DIAGNOSIS — N250 Renal osteodystrophy: Secondary | ICD-10-CM | POA: Diagnosis not present

## 2022-05-16 DIAGNOSIS — N186 End stage renal disease: Secondary | ICD-10-CM | POA: Diagnosis not present

## 2022-05-16 DIAGNOSIS — D509 Iron deficiency anemia, unspecified: Secondary | ICD-10-CM | POA: Diagnosis not present

## 2022-05-16 DIAGNOSIS — D631 Anemia in chronic kidney disease: Secondary | ICD-10-CM | POA: Diagnosis not present

## 2022-05-17 DIAGNOSIS — N186 End stage renal disease: Secondary | ICD-10-CM | POA: Diagnosis not present

## 2022-05-17 DIAGNOSIS — N250 Renal osteodystrophy: Secondary | ICD-10-CM | POA: Diagnosis not present

## 2022-05-17 DIAGNOSIS — D631 Anemia in chronic kidney disease: Secondary | ICD-10-CM | POA: Diagnosis not present

## 2022-05-17 DIAGNOSIS — D509 Iron deficiency anemia, unspecified: Secondary | ICD-10-CM | POA: Diagnosis not present

## 2022-05-17 DIAGNOSIS — Z992 Dependence on renal dialysis: Secondary | ICD-10-CM | POA: Diagnosis not present

## 2022-05-18 DIAGNOSIS — N250 Renal osteodystrophy: Secondary | ICD-10-CM | POA: Diagnosis not present

## 2022-05-18 DIAGNOSIS — Z992 Dependence on renal dialysis: Secondary | ICD-10-CM | POA: Diagnosis not present

## 2022-05-18 DIAGNOSIS — D631 Anemia in chronic kidney disease: Secondary | ICD-10-CM | POA: Diagnosis not present

## 2022-05-18 DIAGNOSIS — D509 Iron deficiency anemia, unspecified: Secondary | ICD-10-CM | POA: Diagnosis not present

## 2022-05-18 DIAGNOSIS — N186 End stage renal disease: Secondary | ICD-10-CM | POA: Diagnosis not present

## 2022-05-19 DIAGNOSIS — D631 Anemia in chronic kidney disease: Secondary | ICD-10-CM | POA: Diagnosis not present

## 2022-05-19 DIAGNOSIS — N186 End stage renal disease: Secondary | ICD-10-CM | POA: Diagnosis not present

## 2022-05-19 DIAGNOSIS — D509 Iron deficiency anemia, unspecified: Secondary | ICD-10-CM | POA: Diagnosis not present

## 2022-05-19 DIAGNOSIS — Z992 Dependence on renal dialysis: Secondary | ICD-10-CM | POA: Diagnosis not present

## 2022-05-19 DIAGNOSIS — N250 Renal osteodystrophy: Secondary | ICD-10-CM | POA: Diagnosis not present

## 2022-05-20 DIAGNOSIS — Z992 Dependence on renal dialysis: Secondary | ICD-10-CM | POA: Diagnosis not present

## 2022-05-20 DIAGNOSIS — N186 End stage renal disease: Secondary | ICD-10-CM | POA: Diagnosis not present

## 2022-05-20 DIAGNOSIS — N250 Renal osteodystrophy: Secondary | ICD-10-CM | POA: Diagnosis not present

## 2022-05-20 DIAGNOSIS — D509 Iron deficiency anemia, unspecified: Secondary | ICD-10-CM | POA: Diagnosis not present

## 2022-05-20 DIAGNOSIS — D631 Anemia in chronic kidney disease: Secondary | ICD-10-CM | POA: Diagnosis not present

## 2022-05-21 DIAGNOSIS — D631 Anemia in chronic kidney disease: Secondary | ICD-10-CM | POA: Diagnosis not present

## 2022-05-21 DIAGNOSIS — N186 End stage renal disease: Secondary | ICD-10-CM | POA: Diagnosis not present

## 2022-05-21 DIAGNOSIS — D509 Iron deficiency anemia, unspecified: Secondary | ICD-10-CM | POA: Diagnosis not present

## 2022-05-21 DIAGNOSIS — N250 Renal osteodystrophy: Secondary | ICD-10-CM | POA: Diagnosis not present

## 2022-05-21 DIAGNOSIS — Z992 Dependence on renal dialysis: Secondary | ICD-10-CM | POA: Diagnosis not present

## 2022-05-22 ENCOUNTER — Ambulatory Visit: Payer: Self-pay | Admitting: *Deleted

## 2022-05-22 ENCOUNTER — Ambulatory Visit (INDEPENDENT_AMBULATORY_CARE_PROVIDER_SITE_OTHER): Payer: Medicare Other | Admitting: Orthopedic Surgery

## 2022-05-22 DIAGNOSIS — D631 Anemia in chronic kidney disease: Secondary | ICD-10-CM | POA: Diagnosis not present

## 2022-05-22 DIAGNOSIS — D509 Iron deficiency anemia, unspecified: Secondary | ICD-10-CM | POA: Diagnosis not present

## 2022-05-22 DIAGNOSIS — I2111 ST elevation (STEMI) myocardial infarction involving right coronary artery: Secondary | ICD-10-CM

## 2022-05-22 DIAGNOSIS — M62838 Other muscle spasm: Secondary | ICD-10-CM | POA: Diagnosis not present

## 2022-05-22 DIAGNOSIS — Z992 Dependence on renal dialysis: Secondary | ICD-10-CM | POA: Diagnosis not present

## 2022-05-22 DIAGNOSIS — N186 End stage renal disease: Secondary | ICD-10-CM | POA: Diagnosis not present

## 2022-05-22 DIAGNOSIS — N250 Renal osteodystrophy: Secondary | ICD-10-CM | POA: Diagnosis not present

## 2022-05-22 NOTE — Patient Outreach (Signed)
  Care Coordination   05/23/2022 Name: [name] [name] MRN: [id number] DOB: [birth year]   Care Coordination Outreach Attempts:  A second unsuccessful outreach was attempted today to offer the patient with information about available care coordination services as a benefit of their health plan.     Follow Up Plan:  Additional outreach attempts will be made to offer the patient care coordination information and services.   Encounter Outcome:  No Answer  Care Coordination Interventions Activated:  No   Care Coordination Interventions:  No, not indicated    SIG Paitlyn Mcclatchey C. Burgess Estelle, MSN, [facility] Gerontological Nurse Practitioner [facility] Care Management [phone]

## 2022-05-22 NOTE — Progress Notes (Signed)
Chief Complaint  Patient presents with   Right Shoulder - Pain    NO NEW INJURY Started hurting within the last month   Left Shoulder - Pain    NO NEW INJURY Started hurting within the last month   [name] [name] complains of pain in his upper trapezius and the posterior aspect near the clavicle  Exam shows tenderness in these areas with normal strength in the rotator cuff negative impingement signs  I recommend some dry needling  He is on an anticoagulant and cannot take anti-inflammatories.  He tried some muscle creams and muscle rubs that did not work  His neck exam was normal  Recommend dry needling

## 2022-05-23 ENCOUNTER — Encounter: Payer: Self-pay | Admitting: *Deleted

## 2022-05-23 DIAGNOSIS — Z992 Dependence on renal dialysis: Secondary | ICD-10-CM | POA: Diagnosis not present

## 2022-05-23 DIAGNOSIS — N186 End stage renal disease: Secondary | ICD-10-CM | POA: Diagnosis not present

## 2022-05-23 DIAGNOSIS — D631 Anemia in chronic kidney disease: Secondary | ICD-10-CM | POA: Diagnosis not present

## 2022-05-23 DIAGNOSIS — D509 Iron deficiency anemia, unspecified: Secondary | ICD-10-CM | POA: Diagnosis not present

## 2022-05-23 DIAGNOSIS — Z961 Presence of intraocular lens: Secondary | ICD-10-CM | POA: Diagnosis not present

## 2022-05-23 DIAGNOSIS — E119 Type 2 diabetes mellitus without complications: Secondary | ICD-10-CM | POA: Diagnosis not present

## 2022-05-23 DIAGNOSIS — N250 Renal osteodystrophy: Secondary | ICD-10-CM | POA: Diagnosis not present

## 2022-05-23 DIAGNOSIS — Z794 Long term (current) use of insulin: Secondary | ICD-10-CM | POA: Diagnosis not present

## 2022-05-24 DIAGNOSIS — N186 End stage renal disease: Secondary | ICD-10-CM | POA: Diagnosis not present

## 2022-05-24 DIAGNOSIS — N250 Renal osteodystrophy: Secondary | ICD-10-CM | POA: Diagnosis not present

## 2022-05-24 DIAGNOSIS — D509 Iron deficiency anemia, unspecified: Secondary | ICD-10-CM | POA: Diagnosis not present

## 2022-05-24 DIAGNOSIS — Z992 Dependence on renal dialysis: Secondary | ICD-10-CM | POA: Diagnosis not present

## 2022-05-24 DIAGNOSIS — D631 Anemia in chronic kidney disease: Secondary | ICD-10-CM | POA: Diagnosis not present

## 2022-05-25 DIAGNOSIS — Z992 Dependence on renal dialysis: Secondary | ICD-10-CM | POA: Diagnosis not present

## 2022-05-25 DIAGNOSIS — N250 Renal osteodystrophy: Secondary | ICD-10-CM | POA: Diagnosis not present

## 2022-05-25 DIAGNOSIS — D631 Anemia in chronic kidney disease: Secondary | ICD-10-CM | POA: Diagnosis not present

## 2022-05-25 DIAGNOSIS — N186 End stage renal disease: Secondary | ICD-10-CM | POA: Diagnosis not present

## 2022-05-25 DIAGNOSIS — D509 Iron deficiency anemia, unspecified: Secondary | ICD-10-CM | POA: Diagnosis not present

## 2022-05-26 ENCOUNTER — Encounter: Payer: Self-pay | Admitting: *Deleted

## 2022-05-26 ENCOUNTER — Telehealth: Payer: Self-pay | Admitting: *Deleted

## 2022-05-26 DIAGNOSIS — N250 Renal osteodystrophy: Secondary | ICD-10-CM | POA: Diagnosis not present

## 2022-05-26 DIAGNOSIS — Z992 Dependence on renal dialysis: Secondary | ICD-10-CM | POA: Diagnosis not present

## 2022-05-26 DIAGNOSIS — D509 Iron deficiency anemia, unspecified: Secondary | ICD-10-CM | POA: Diagnosis not present

## 2022-05-26 DIAGNOSIS — D631 Anemia in chronic kidney disease: Secondary | ICD-10-CM | POA: Diagnosis not present

## 2022-05-26 DIAGNOSIS — N186 End stage renal disease: Secondary | ICD-10-CM | POA: Diagnosis not present

## 2022-05-26 NOTE — Patient Outreach (Signed)
  Care Coordination   Follow Up Visit Note and Case Closure   05/26/2022 Name: [name] [name] MRN: [id number] DOB: 1950-09-02  [name] [name] is a 71 y.o. year old male who sees [name], [name] Baxter, MD for primary care. I spoke with  [name] [name] by phone today.  What matters to the patients health and wellness today?  Continuing to follow my disease management requirements.    Goals Addressed               This Visit's Progress     Patient Stated     COMPLETED: I need to lose wt to be eligible for possible kidney transplant. Goal is 195#, current wt 219#, has already lost 16# in last 3 months. (pt-stated)        Care Coordination Interventions: Assessed the Patient understanding of chronic kidney disease    Evaluation of current treatment plan related to chronic kidney disease self management and patient's adherence to plan as established by provider      Reviewed medications with patient and discussed importance of compliance    Counseled on the importance of exercise goals with target of 150 minutes per week     Discussed plans with patient for ongoing care management follow up and provided patient with direct contact information for care management team     [name] [name] is very motivated to achieve his wt loss goal. Today's wt was 217#. He continues to do home dialysis and goes to the dialysis center once a month for an assessment. He follows the recommended diet, exercises regularly.  He had a tough year, NSTEMI 07/22/21 1/10 s/p LHC -severe RCA stenosis status post successful DES.  1/13 ED visit for hypotension. 1/29 ED visit for back pain and was COVID 19+ 5/31 ED visit for CP -evaluated and sent home with caution 6/8 Admitted with Unstable angina, new heart block, cath revealed stenoses requiring additional          angioplasties. 7/5 Admitted for CP, Stenting required again for in stent stenosis of RCA  [facility] Team met with Dr. Legrand Rams and his office staff 05/25/22.  They provide CCM services. NP has advised pt that she will close his case and report to Dr. Legrand Rams for possible CCM. [facility] NP followed from June 2023 to present.  [name] [name] has been a pleasure to work with!          SDOH assessments and interventions completed:  Yes Previously assessed.   Care Coordination Interventions Activated:  Yes  Care Coordination Interventions:  Yes, provided   Follow up plan: No further intervention required.   Encounter Outcome:  Pt. Visit Completed   Kayleen Memos C. [name], MSN, [facility] Gerontological Nurse Practitioner [facility] Care Management [phone]

## 2022-05-27 DIAGNOSIS — Z992 Dependence on renal dialysis: Secondary | ICD-10-CM | POA: Diagnosis not present

## 2022-05-27 DIAGNOSIS — D509 Iron deficiency anemia, unspecified: Secondary | ICD-10-CM | POA: Diagnosis not present

## 2022-05-27 DIAGNOSIS — N250 Renal osteodystrophy: Secondary | ICD-10-CM | POA: Diagnosis not present

## 2022-05-27 DIAGNOSIS — D631 Anemia in chronic kidney disease: Secondary | ICD-10-CM | POA: Diagnosis not present

## 2022-05-27 DIAGNOSIS — N186 End stage renal disease: Secondary | ICD-10-CM | POA: Diagnosis not present

## 2022-05-28 DIAGNOSIS — Z992 Dependence on renal dialysis: Secondary | ICD-10-CM | POA: Diagnosis not present

## 2022-05-28 DIAGNOSIS — D509 Iron deficiency anemia, unspecified: Secondary | ICD-10-CM | POA: Diagnosis not present

## 2022-05-28 DIAGNOSIS — N250 Renal osteodystrophy: Secondary | ICD-10-CM | POA: Diagnosis not present

## 2022-05-28 DIAGNOSIS — N186 End stage renal disease: Secondary | ICD-10-CM | POA: Diagnosis not present

## 2022-05-28 DIAGNOSIS — D631 Anemia in chronic kidney disease: Secondary | ICD-10-CM | POA: Diagnosis not present

## 2022-05-29 DIAGNOSIS — Z992 Dependence on renal dialysis: Secondary | ICD-10-CM | POA: Diagnosis not present

## 2022-05-29 DIAGNOSIS — N250 Renal osteodystrophy: Secondary | ICD-10-CM | POA: Diagnosis not present

## 2022-05-29 DIAGNOSIS — D509 Iron deficiency anemia, unspecified: Secondary | ICD-10-CM | POA: Diagnosis not present

## 2022-05-29 DIAGNOSIS — D631 Anemia in chronic kidney disease: Secondary | ICD-10-CM | POA: Diagnosis not present

## 2022-05-29 DIAGNOSIS — N186 End stage renal disease: Secondary | ICD-10-CM | POA: Diagnosis not present

## 2022-05-30 ENCOUNTER — Emergency Department (HOSPITAL_COMMUNITY): Payer: Medicare Other

## 2022-05-30 ENCOUNTER — Other Ambulatory Visit: Payer: Self-pay

## 2022-05-30 ENCOUNTER — Encounter (HOSPITAL_COMMUNITY): Payer: Self-pay

## 2022-05-30 ENCOUNTER — Inpatient Hospital Stay (HOSPITAL_COMMUNITY)
Admission: EM | Admit: 2022-05-30 | Discharge: 2022-06-19 | DRG: 233 | Disposition: A | Discharge status: Home Health | Payer: Medicare Other | Attending: Thoracic Surgery (Cardiothoracic Vascular Surgery) | Admitting: Thoracic Surgery (Cardiothoracic Vascular Surgery)

## 2022-05-30 ENCOUNTER — Encounter (HOSPITAL_COMMUNITY)
Admission: EM | Disposition: A | Discharge status: Home Health | Payer: Self-pay | Source: Home / Self Care | Attending: Thoracic Surgery (Cardiothoracic Vascular Surgery)

## 2022-05-30 DIAGNOSIS — Z86010 Personal history of colonic polyps: Secondary | ICD-10-CM

## 2022-05-30 DIAGNOSIS — I25110 Atherosclerotic heart disease of native coronary artery with unstable angina pectoris: Secondary | ICD-10-CM | POA: Diagnosis present

## 2022-05-30 DIAGNOSIS — I152 Hypertension secondary to endocrine disorders: Secondary | ICD-10-CM

## 2022-05-30 DIAGNOSIS — Z01818 Encounter for other preprocedural examination: Secondary | ICD-10-CM | POA: Diagnosis not present

## 2022-05-30 DIAGNOSIS — R079 Chest pain, unspecified: Secondary | ICD-10-CM | POA: Diagnosis not present

## 2022-05-30 DIAGNOSIS — G4733 Obstructive sleep apnea (adult) (pediatric): Secondary | ICD-10-CM

## 2022-05-30 DIAGNOSIS — Z794 Long term (current) use of insulin: Secondary | ICD-10-CM | POA: Diagnosis not present

## 2022-05-30 DIAGNOSIS — Z9989 Dependence on other enabling machines and devices: Secondary | ICD-10-CM

## 2022-05-30 DIAGNOSIS — I10 Essential (primary) hypertension: Secondary | ICD-10-CM | POA: Diagnosis not present

## 2022-05-30 DIAGNOSIS — Z87891 Personal history of nicotine dependence: Secondary | ICD-10-CM

## 2022-05-30 DIAGNOSIS — Z7902 Long term (current) use of antithrombotics/antiplatelets: Secondary | ICD-10-CM

## 2022-05-30 DIAGNOSIS — I442 Atrioventricular block, complete: Secondary | ICD-10-CM | POA: Diagnosis not present

## 2022-05-30 DIAGNOSIS — D509 Iron deficiency anemia, unspecified: Secondary | ICD-10-CM | POA: Diagnosis not present

## 2022-05-30 DIAGNOSIS — J90 Pleural effusion, not elsewhere classified: Secondary | ICD-10-CM | POA: Diagnosis not present

## 2022-05-30 DIAGNOSIS — K5900 Constipation, unspecified: Secondary | ICD-10-CM | POA: Diagnosis not present

## 2022-05-30 DIAGNOSIS — F05 Delirium due to known physiological condition: Secondary | ICD-10-CM | POA: Diagnosis not present

## 2022-05-30 DIAGNOSIS — Z825 Family history of asthma and other chronic lower respiratory diseases: Secondary | ICD-10-CM

## 2022-05-30 DIAGNOSIS — I252 Old myocardial infarction: Secondary | ICD-10-CM

## 2022-05-30 DIAGNOSIS — Z96651 Presence of right artificial knee joint: Secondary | ICD-10-CM | POA: Diagnosis present

## 2022-05-30 DIAGNOSIS — N186 End stage renal disease: Secondary | ICD-10-CM | POA: Diagnosis not present

## 2022-05-30 DIAGNOSIS — I200 Unstable angina: Secondary | ICD-10-CM | POA: Diagnosis not present

## 2022-05-30 DIAGNOSIS — E1122 Type 2 diabetes mellitus with diabetic chronic kidney disease: Secondary | ICD-10-CM | POA: Diagnosis present

## 2022-05-30 DIAGNOSIS — K219 Gastro-esophageal reflux disease without esophagitis: Secondary | ICD-10-CM | POA: Diagnosis not present

## 2022-05-30 DIAGNOSIS — T82855A Stenosis of coronary artery stent, initial encounter: Secondary | ICD-10-CM | POA: Diagnosis present

## 2022-05-30 DIAGNOSIS — Y831 Surgical operation with implant of artificial internal device as the cause of abnormal reaction of the patient, or of later complication, without mention of misadventure at the time of the procedure: Secondary | ICD-10-CM | POA: Diagnosis present

## 2022-05-30 DIAGNOSIS — I4430 Unspecified atrioventricular block: Secondary | ICD-10-CM

## 2022-05-30 DIAGNOSIS — R001 Bradycardia, unspecified: Secondary | ICD-10-CM | POA: Diagnosis present

## 2022-05-30 DIAGNOSIS — Z4682 Encounter for fitting and adjustment of non-vascular catheter: Secondary | ICD-10-CM | POA: Diagnosis not present

## 2022-05-30 DIAGNOSIS — I509 Heart failure, unspecified: Secondary | ICD-10-CM | POA: Diagnosis present

## 2022-05-30 DIAGNOSIS — Z8261 Family history of arthritis: Secondary | ICD-10-CM

## 2022-05-30 DIAGNOSIS — R739 Hyperglycemia, unspecified: Secondary | ICD-10-CM | POA: Diagnosis not present

## 2022-05-30 DIAGNOSIS — E1159 Type 2 diabetes mellitus with other circulatory complications: Secondary | ICD-10-CM

## 2022-05-30 DIAGNOSIS — Z833 Family history of diabetes mellitus: Secondary | ICD-10-CM

## 2022-05-30 DIAGNOSIS — Z992 Dependence on renal dialysis: Secondary | ICD-10-CM | POA: Diagnosis not present

## 2022-05-30 DIAGNOSIS — I088 Other rheumatic multiple valve diseases: Secondary | ICD-10-CM | POA: Diagnosis not present

## 2022-05-30 DIAGNOSIS — I9581 Postprocedural hypotension: Secondary | ICD-10-CM | POA: Diagnosis not present

## 2022-05-30 DIAGNOSIS — I214 Non-ST elevation (NSTEMI) myocardial infarction: Secondary | ICD-10-CM | POA: Diagnosis not present

## 2022-05-30 DIAGNOSIS — Z1152 Encounter for screening for COVID-19: Secondary | ICD-10-CM | POA: Diagnosis not present

## 2022-05-30 DIAGNOSIS — R0789 Other chest pain: Secondary | ICD-10-CM | POA: Diagnosis not present

## 2022-05-30 DIAGNOSIS — I120 Hypertensive chronic kidney disease with stage 5 chronic kidney disease or end stage renal disease: Secondary | ICD-10-CM | POA: Diagnosis present

## 2022-05-30 DIAGNOSIS — Z951 Presence of aortocoronary bypass graft: Secondary | ICD-10-CM | POA: Diagnosis not present

## 2022-05-30 DIAGNOSIS — Z01810 Encounter for preprocedural cardiovascular examination: Secondary | ICD-10-CM | POA: Diagnosis not present

## 2022-05-30 DIAGNOSIS — R109 Unspecified abdominal pain: Secondary | ICD-10-CM | POA: Diagnosis not present

## 2022-05-30 DIAGNOSIS — R579 Shock, unspecified: Secondary | ICD-10-CM | POA: Diagnosis not present

## 2022-05-30 DIAGNOSIS — D62 Acute posthemorrhagic anemia: Secondary | ICD-10-CM | POA: Diagnosis not present

## 2022-05-30 DIAGNOSIS — E785 Hyperlipidemia, unspecified: Secondary | ICD-10-CM | POA: Diagnosis not present

## 2022-05-30 DIAGNOSIS — Z6841 Body Mass Index (BMI) 40.0 and over, adult: Secondary | ICD-10-CM

## 2022-05-30 DIAGNOSIS — I517 Cardiomegaly: Secondary | ICD-10-CM | POA: Diagnosis not present

## 2022-05-30 DIAGNOSIS — I441 Atrioventricular block, second degree: Secondary | ICD-10-CM

## 2022-05-30 DIAGNOSIS — J811 Chronic pulmonary edema: Secondary | ICD-10-CM | POA: Diagnosis not present

## 2022-05-30 DIAGNOSIS — N250 Renal osteodystrophy: Secondary | ICD-10-CM | POA: Diagnosis not present

## 2022-05-30 DIAGNOSIS — J9601 Acute respiratory failure with hypoxia: Secondary | ICD-10-CM

## 2022-05-30 DIAGNOSIS — R0602 Shortness of breath: Secondary | ICD-10-CM | POA: Diagnosis not present

## 2022-05-30 DIAGNOSIS — J4489 Other specified chronic obstructive pulmonary disease: Secondary | ICD-10-CM | POA: Diagnosis present

## 2022-05-30 DIAGNOSIS — Z8249 Family history of ischemic heart disease and other diseases of the circulatory system: Secondary | ICD-10-CM

## 2022-05-30 DIAGNOSIS — M898X9 Other specified disorders of bone, unspecified site: Secondary | ICD-10-CM | POA: Diagnosis present

## 2022-05-30 DIAGNOSIS — Z452 Encounter for adjustment and management of vascular access device: Secondary | ICD-10-CM | POA: Diagnosis not present

## 2022-05-30 DIAGNOSIS — J9811 Atelectasis: Secondary | ICD-10-CM | POA: Diagnosis present

## 2022-05-30 DIAGNOSIS — N5082 Scrotal pain: Secondary | ICD-10-CM | POA: Diagnosis not present

## 2022-05-30 DIAGNOSIS — J810 Acute pulmonary edema: Secondary | ICD-10-CM | POA: Diagnosis not present

## 2022-05-30 DIAGNOSIS — G4700 Insomnia, unspecified: Secondary | ICD-10-CM | POA: Diagnosis not present

## 2022-05-30 DIAGNOSIS — R570 Cardiogenic shock: Secondary | ICD-10-CM | POA: Diagnosis not present

## 2022-05-30 DIAGNOSIS — I2510 Atherosclerotic heart disease of native coronary artery without angina pectoris: Secondary | ICD-10-CM | POA: Diagnosis not present

## 2022-05-30 DIAGNOSIS — Z8711 Personal history of peptic ulcer disease: Secondary | ICD-10-CM

## 2022-05-30 DIAGNOSIS — E1165 Type 2 diabetes mellitus with hyperglycemia: Secondary | ICD-10-CM | POA: Diagnosis present

## 2022-05-30 DIAGNOSIS — R5381 Other malaise: Secondary | ICD-10-CM | POA: Diagnosis not present

## 2022-05-30 DIAGNOSIS — T68XXXA Hypothermia, initial encounter: Secondary | ICD-10-CM | POA: Diagnosis not present

## 2022-05-30 DIAGNOSIS — D631 Anemia in chronic kidney disease: Secondary | ICD-10-CM | POA: Diagnosis present

## 2022-05-30 DIAGNOSIS — I959 Hypotension, unspecified: Secondary | ICD-10-CM | POA: Diagnosis not present

## 2022-05-30 DIAGNOSIS — E875 Hyperkalemia: Secondary | ICD-10-CM | POA: Diagnosis not present

## 2022-05-30 DIAGNOSIS — R300 Dysuria: Secondary | ICD-10-CM | POA: Diagnosis not present

## 2022-05-30 DIAGNOSIS — Z79899 Other long term (current) drug therapy: Secondary | ICD-10-CM

## 2022-05-30 DIAGNOSIS — Z77098 Contact with and (suspected) exposure to other hazardous, chiefly nonmedicinal, chemicals: Secondary | ICD-10-CM | POA: Diagnosis present

## 2022-05-30 DIAGNOSIS — I25118 Atherosclerotic heart disease of native coronary artery with other forms of angina pectoris: Secondary | ICD-10-CM | POA: Diagnosis not present

## 2022-05-30 DIAGNOSIS — J449 Chronic obstructive pulmonary disease, unspecified: Secondary | ICD-10-CM | POA: Diagnosis not present

## 2022-05-30 DIAGNOSIS — Z7982 Long term (current) use of aspirin: Secondary | ICD-10-CM

## 2022-05-30 DIAGNOSIS — Z885 Allergy status to narcotic agent status: Secondary | ICD-10-CM

## 2022-05-30 DIAGNOSIS — E662 Morbid (severe) obesity with alveolar hypoventilation: Secondary | ICD-10-CM | POA: Diagnosis present

## 2022-05-30 DIAGNOSIS — R918 Other nonspecific abnormal finding of lung field: Secondary | ICD-10-CM | POA: Diagnosis not present

## 2022-05-30 DIAGNOSIS — I872 Venous insufficiency (chronic) (peripheral): Secondary | ICD-10-CM | POA: Diagnosis present

## 2022-05-30 HISTORY — PX: RIGHT/LEFT HEART CATH AND CORONARY ANGIOGRAPHY: CATH118266

## 2022-05-30 LAB — TROPONIN I (HIGH SENSITIVITY)
Troponin I (High Sensitivity): 100 ng/L (ref <18)
Troponin I (High Sensitivity): 142 ng/L (ref <18)
Troponin I (High Sensitivity): 330 ng/L (ref <18)

## 2022-05-30 LAB — BASIC METABOLIC PANEL
Anion gap: 14 (ref 5–15)
BUN: 44 mg/dL — ABNORMAL HIGH (ref 8–23)
CO2: 26 mmol/L (ref 22–32)
Calcium: 9.9 mg/dL (ref 8.9–10.3)
Chloride: 96 mmol/L — ABNORMAL LOW (ref 98–111)
Creatinine, Ser: 17.29 mg/dL — ABNORMAL HIGH (ref 0.61–1.24)
GFR, Estimated: 3 mL/min — ABNORMAL LOW (ref >60)
Glucose, Bld: 128 mg/dL — ABNORMAL HIGH (ref 70–99)
Potassium: 3.6 mmol/L (ref 3.5–5.1)
Sodium: 136 mmol/L (ref 135–145)

## 2022-05-30 LAB — CBC WITH DIFFERENTIAL/PLATELET
Abs Immature Granulocytes: 0.02 10*3/uL (ref 0.00–0.07)
Basophils Absolute: 0 10*3/uL (ref 0.0–0.1)
Basophils Relative: 1 %
Eosinophils Absolute: 0.1 10*3/uL (ref 0.0–0.5)
Eosinophils Relative: 3 %
HCT: 26.8 % — ABNORMAL LOW (ref 39.0–52.0)
Hemoglobin: 8.9 g/dL — ABNORMAL LOW (ref 13.0–17.0)
Immature Granulocytes: 1 %
Lymphocytes Relative: 11 %
Lymphs Abs: 0.5 10*3/uL — ABNORMAL LOW (ref 0.7–4.0)
MCH: 31.2 pg (ref 26.0–34.0)
MCHC: 33.2 g/dL (ref 30.0–36.0)
MCV: 94 fL (ref 80.0–100.0)
Monocytes Absolute: 0.4 10*3/uL (ref 0.1–1.0)
Monocytes Relative: 9 %
Neutro Abs: 3.3 10*3/uL (ref 1.7–7.7)
Neutrophils Relative %: 75 %
Platelets: 126 10*3/uL — ABNORMAL LOW (ref 150–400)
RBC: 2.85 MIL/uL — ABNORMAL LOW (ref 4.22–5.81)
RDW: 15 % (ref 11.5–15.5)
WBC: 4.4 10*3/uL (ref 4.0–10.5)
nRBC: 0 % (ref 0.0–0.2)

## 2022-05-30 LAB — POCT I-STAT EG7
Acid-Base Excess: 1 mmol/L (ref 0.0–2.0)
Acid-Base Excess: 2 mmol/L (ref 0.0–2.0)
Acid-Base Excess: 2 mmol/L (ref 0.0–2.0)
Bicarbonate: 26.7 mmol/L (ref 20.0–28.0)
Bicarbonate: 28.3 mmol/L — ABNORMAL HIGH (ref 20.0–28.0)
Bicarbonate: 28.4 mmol/L — ABNORMAL HIGH (ref 20.0–28.0)
Calcium, Ion: 1.27 mmol/L (ref 1.15–1.40)
Calcium, Ion: 1.29 mmol/L (ref 1.15–1.40)
Calcium, Ion: 1.32 mmol/L (ref 1.15–1.40)
HCT: 25 % — ABNORMAL LOW (ref 39.0–52.0)
HCT: 25 % — ABNORMAL LOW (ref 39.0–52.0)
HCT: 26 % — ABNORMAL LOW (ref 39.0–52.0)
Hemoglobin: 8.5 g/dL — ABNORMAL LOW (ref 13.0–17.0)
Hemoglobin: 8.5 g/dL — ABNORMAL LOW (ref 13.0–17.0)
Hemoglobin: 8.8 g/dL — ABNORMAL LOW (ref 13.0–17.0)
O2 Saturation: 63 %
O2 Saturation: 65 %
O2 Saturation: 97 %
Potassium: 3.5 mmol/L (ref 3.5–5.1)
Potassium: 3.5 mmol/L (ref 3.5–5.1)
Potassium: 3.5 mmol/L (ref 3.5–5.1)
Sodium: 135 mmol/L (ref 135–145)
Sodium: 135 mmol/L (ref 135–145)
Sodium: 135 mmol/L (ref 135–145)
TCO2: 28 mmol/L (ref 22–32)
TCO2: 30 mmol/L (ref 22–32)
TCO2: 30 mmol/L (ref 22–32)
pCO2, Ven: 47 mmHg (ref 44–60)
pCO2, Ven: 53.3 mmHg (ref 44–60)
pCO2, Ven: 55 mmHg (ref 44–60)
pH, Ven: 7.321 (ref 7.25–7.43)
pH, Ven: 7.334 (ref 7.25–7.43)
pH, Ven: 7.362 (ref 7.25–7.43)
pO2, Ven: 36 mmHg (ref 32–45)
pO2, Ven: 36 mmHg (ref 32–45)
pO2, Ven: 91 mmHg — ABNORMAL HIGH (ref 32–45)

## 2022-05-30 LAB — HEMOGLOBIN A1C
Hgb A1c MFr Bld: 5.9 % — ABNORMAL HIGH (ref 4.8–5.6)
Mean Plasma Glucose: 122.63 mg/dL

## 2022-05-30 LAB — GLUCOSE, CAPILLARY
Glucose-Capillary: 88 mg/dL (ref 70–99)
Glucose-Capillary: 91 mg/dL (ref 70–99)

## 2022-05-30 LAB — HEPATIC FUNCTION PANEL
ALT: 13 U/L (ref 0–44)
AST: 14 U/L — ABNORMAL LOW (ref 15–41)
Albumin: 3 g/dL — ABNORMAL LOW (ref 3.5–5.0)
Alkaline Phosphatase: 68 U/L (ref 38–126)
Bilirubin, Direct: <0.1 mg/dL (ref 0.0–0.2)
Total Bilirubin: 0.3 mg/dL (ref 0.3–1.2)
Total Protein: 6.4 g/dL — ABNORMAL LOW (ref 6.5–8.1)

## 2022-05-30 LAB — TSH: TSH: 2.033 u[IU]/mL (ref 0.350–4.500)

## 2022-05-30 LAB — CBG MONITORING, ED: Glucose-Capillary: 98 mg/dL (ref 70–99)

## 2022-05-30 LAB — MAGNESIUM: Magnesium: 2.1 mg/dL (ref 1.7–2.4)

## 2022-05-30 SURGERY — RIGHT/LEFT HEART CATH AND CORONARY ANGIOGRAPHY
Anesthesia: LOCAL

## 2022-05-30 MED ORDER — OXYCODONE-ACETAMINOPHEN 5-325 MG PO TABS: 1.0000 | ORAL_TABLET | Freq: Once | ORAL | Status: DC

## 2022-05-30 MED ORDER — MORPHINE SULFATE (PF) 4 MG/ML IV SOLN
4.0000 mg | INTRAVENOUS | Status: DC | PRN
  Administered 2022-05-30: 4 mg via INTRAVENOUS
  Filled 2022-05-30: qty 1

## 2022-05-30 MED ORDER — SIMVASTATIN 20 MG PO TABS
20.0000 mg | ORAL_TABLET | Freq: Every morning | ORAL | Status: DC
  Administered 2022-05-30 – 2022-06-08 (×9): 20 mg via ORAL
  Filled 2022-05-30 (×9): qty 1

## 2022-05-30 MED ORDER — PANTOPRAZOLE SODIUM 40 MG PO TBEC
40.0000 mg | DELAYED_RELEASE_TABLET | Freq: Two times a day (BID) | ORAL | Status: DC
  Administered 2022-05-31 – 2022-06-04 (×10): 40 mg via ORAL
  Filled 2022-05-30 (×10): qty 1

## 2022-05-30 MED ORDER — ACETAMINOPHEN 325 MG PO TABS
650.0000 mg | ORAL_TABLET | ORAL | Status: DC | PRN
  Administered 2022-05-31 – 2022-06-03 (×2): 650 mg via ORAL
  Filled 2022-05-30 (×4): qty 2

## 2022-05-30 MED ORDER — SODIUM CHLORIDE 0.9% FLUSH: 3.0000 mL | INTRAVENOUS | Status: DC | PRN

## 2022-05-30 MED ORDER — SODIUM CHLORIDE 0.9 % IV SOLN: 250.0000 mL | INTRAVENOUS | Status: DC | PRN

## 2022-05-30 MED ORDER — FENTANYL CITRATE (PF) 100 MCG/2ML IJ SOLN
INTRAMUSCULAR | Status: DC | PRN
  Administered 2022-05-30 (×2): 25 ug via INTRAVENOUS

## 2022-05-30 MED ORDER — ONDANSETRON HCL 4 MG/2ML IJ SOLN
4.0000 mg | Freq: Four times a day (QID) | INTRAMUSCULAR | Status: DC | PRN
  Administered 2022-05-30: 4 mg via INTRAVENOUS
  Filled 2022-05-30: qty 2

## 2022-05-30 MED ORDER — ASPIRIN 81 MG PO CHEW: 81.0000 mg | CHEWABLE_TABLET | ORAL | Status: DC

## 2022-05-30 MED ORDER — INSULIN GLARGINE-YFGN 100 UNIT/ML ~~LOC~~ SOLN
15.0000 [IU] | Freq: Every day | SUBCUTANEOUS | Status: DC
  Filled 2022-05-30 (×3): qty 0.15

## 2022-05-30 MED ORDER — IOHEXOL 350 MG/ML SOLN
INTRAVENOUS | Status: DC | PRN
  Administered 2022-05-30: 40 mL

## 2022-05-30 MED ORDER — HEPARIN (PORCINE) 25000 UT/250ML-% IV SOLN
1100.0000 [IU]/h | INTRAVENOUS | Status: DC
  Administered 2022-05-30: 1100 [IU]/h via INTRAVENOUS
  Filled 2022-05-30: qty 250

## 2022-05-30 MED ORDER — ASPIRIN 81 MG PO TBEC: 81.0000 mg | DELAYED_RELEASE_TABLET | Freq: Every day | ORAL | Status: DC

## 2022-05-30 MED ORDER — HEPARIN (PORCINE) IN NACL 1000-0.9 UT/500ML-% IV SOLN
INTRAVENOUS | Status: AC
  Filled 2022-05-30: qty 500

## 2022-05-30 MED ORDER — ACETAMINOPHEN 325 MG PO TABS: 650.0000 mg | ORAL_TABLET | ORAL | Status: DC | PRN

## 2022-05-30 MED ORDER — LINAGLIPTIN 5 MG PO TABS
5.0000 mg | ORAL_TABLET | Freq: Every day | ORAL | Status: DC
  Administered 2022-05-30 – 2022-06-04 (×6): 5 mg via ORAL
  Filled 2022-05-30 (×6): qty 1

## 2022-05-30 MED ORDER — HEPARIN BOLUS VIA INFUSION
4000.0000 [IU] | Freq: Once | INTRAVENOUS | Status: AC
  Administered 2022-05-30: 4000 [IU] via INTRAVENOUS
  Filled 2022-05-30: qty 4000

## 2022-05-30 MED ORDER — HYDRALAZINE HCL 20 MG/ML IJ SOLN: 5.0000 mg | Freq: Four times a day (QID) | INTRAMUSCULAR | Status: DC | PRN

## 2022-05-30 MED ORDER — SODIUM CHLORIDE 0.9 % IV SOLN: INTRAVENOUS | Status: DC

## 2022-05-30 MED ORDER — ONDANSETRON HCL 4 MG/2ML IJ SOLN: 4.0000 mg | Freq: Four times a day (QID) | INTRAMUSCULAR | Status: DC | PRN

## 2022-05-30 MED ORDER — SODIUM CHLORIDE 0.9% FLUSH
3.0000 mL | Freq: Two times a day (BID) | INTRAVENOUS | Status: DC
  Administered 2022-05-31: 3 mL via INTRAVENOUS

## 2022-05-30 MED ORDER — LIDOCAINE HCL (PF) 1 % IJ SOLN
INTRAMUSCULAR | Status: AC
  Filled 2022-05-30: qty 30

## 2022-05-30 MED ORDER — MORPHINE SULFATE (PF) 2 MG/ML IV SOLN
INTRAVENOUS | Status: AC
  Filled 2022-05-30: qty 2

## 2022-05-30 MED ORDER — FENTANYL CITRATE (PF) 100 MCG/2ML IJ SOLN
INTRAMUSCULAR | Status: AC
  Filled 2022-05-30: qty 2

## 2022-05-30 MED ORDER — MORPHINE SULFATE (PF) 2 MG/ML IV SOLN
1.0000 mg | INTRAVENOUS | Status: DC | PRN
  Administered 2022-05-30 – 2022-05-31 (×3): 2 mg via INTRAVENOUS
  Filled 2022-05-30 (×2): qty 1

## 2022-05-30 MED ORDER — TORSEMIDE 20 MG PO TABS
100.0000 mg | ORAL_TABLET | Freq: Every day | ORAL | Status: DC
  Administered 2022-05-30 – 2022-06-04 (×6): 100 mg via ORAL
  Filled 2022-05-30 (×6): qty 5

## 2022-05-30 MED ORDER — ASPIRIN 81 MG PO TBEC
81.0000 mg | DELAYED_RELEASE_TABLET | Freq: Every day | ORAL | Status: DC
  Administered 2022-05-31 – 2022-06-04 (×5): 81 mg via ORAL
  Filled 2022-05-30 (×5): qty 1

## 2022-05-30 MED ORDER — ASPIRIN 81 MG PO CHEW: 81.0000 mg | CHEWABLE_TABLET | Freq: Every day | ORAL | Status: DC

## 2022-05-30 MED ORDER — INSULIN ASPART 100 UNIT/ML IJ SOLN: 0.0000 [IU] | Freq: Three times a day (TID) | INTRAMUSCULAR | Status: DC

## 2022-05-30 MED ORDER — HYDRALAZINE HCL 20 MG/ML IJ SOLN: 10.0000 mg | INTRAMUSCULAR | Status: DC | PRN

## 2022-05-30 MED ORDER — TICAGRELOR 90 MG PO TABS
90.0000 mg | ORAL_TABLET | Freq: Two times a day (BID) | ORAL | Status: DC
  Administered 2022-05-30: 90 mg via ORAL
  Filled 2022-05-30: qty 1

## 2022-05-30 MED ORDER — LABETALOL HCL 5 MG/ML IV SOLN: 10.0000 mg | INTRAVENOUS | Status: DC | PRN

## 2022-05-30 MED ORDER — HEPARIN (PORCINE) 25000 UT/250ML-% IV SOLN
1650.0000 [IU]/h | INTRAVENOUS | Status: DC
  Administered 2022-05-31: 1100 [IU]/h via INTRAVENOUS
  Administered 2022-06-01: 1450 [IU]/h via INTRAVENOUS
  Administered 2022-06-02: 1500 [IU]/h via INTRAVENOUS
  Administered 2022-06-03 – 2022-06-04 (×2): 1650 [IU]/h via INTRAVENOUS
  Filled 2022-05-30 (×6): qty 250

## 2022-05-30 MED ORDER — SODIUM CHLORIDE 0.9% FLUSH
3.0000 mL | Freq: Two times a day (BID) | INTRAVENOUS | Status: DC
  Administered 2022-05-30 – 2022-06-04 (×7): 3 mL via INTRAVENOUS

## 2022-05-30 MED ORDER — MIDAZOLAM HCL 2 MG/2ML IJ SOLN
INTRAMUSCULAR | Status: DC | PRN
  Administered 2022-05-30 (×2): 1 mg via INTRAVENOUS

## 2022-05-30 MED ORDER — ALBUTEROL SULFATE (2.5 MG/3ML) 0.083% IN NEBU
2.5000 mg | INHALATION_SOLUTION | Freq: Four times a day (QID) | RESPIRATORY_TRACT | Status: DC | PRN
  Administered 2022-06-04: 2.5 mg via RESPIRATORY_TRACT
  Filled 2022-05-30: qty 3

## 2022-05-30 MED ORDER — ASPIRIN 81 MG PO CHEW: 324.0000 mg | CHEWABLE_TABLET | ORAL | Status: DC

## 2022-05-30 MED ORDER — ASPIRIN 300 MG RE SUPP: 300.0000 mg | RECTAL | Status: DC

## 2022-05-30 MED ORDER — HEPARIN (PORCINE) IN NACL 1000-0.9 UT/500ML-% IV SOLN
INTRAVENOUS | Status: DC | PRN
  Administered 2022-05-30 (×2): 500 mL

## 2022-05-30 MED ORDER — CALCITRIOL 0.25 MCG PO CAPS
0.2500 ug | ORAL_CAPSULE | Freq: Every day | ORAL | Status: DC
  Administered 2022-05-30 – 2022-06-04 (×6): 0.25 ug via ORAL
  Filled 2022-05-30 (×6): qty 1

## 2022-05-30 MED ORDER — SEVELAMER CARBONATE 800 MG PO TABS
1600.0000 mg | ORAL_TABLET | Freq: Three times a day (TID) | ORAL | Status: DC
  Administered 2022-05-30 – 2022-06-04 (×16): 1600 mg via ORAL
  Filled 2022-05-30 (×17): qty 2

## 2022-05-30 MED ORDER — NITROGLYCERIN 2 % TD OINT
0.5000 [in_us] | TOPICAL_OINTMENT | Freq: Once | TRANSDERMAL | Status: AC
  Administered 2022-05-30: 0.5 [in_us] via TOPICAL
  Filled 2022-05-30: qty 1

## 2022-05-30 MED ORDER — MIDAZOLAM HCL 2 MG/2ML IJ SOLN
INTRAMUSCULAR | Status: AC
  Filled 2022-05-30: qty 2

## 2022-05-30 MED ORDER — LIDOCAINE HCL (PF) 1 % IJ SOLN
INTRAMUSCULAR | Status: DC | PRN
  Administered 2022-05-30: 20 mL
  Administered 2022-05-30: 15 mL

## 2022-05-30 MED ORDER — NITROGLYCERIN 0.4 MG SL SUBL
0.4000 mg | SUBLINGUAL_TABLET | SUBLINGUAL | Status: DC | PRN
  Administered 2022-06-01 – 2022-06-05 (×6): 0.4 mg via SUBLINGUAL
  Filled 2022-05-30 (×5): qty 1

## 2022-05-30 SURGICAL SUPPLY — 11 items
CATH INFINITI 5FR MULTPACK ANG (CATHETERS) IMPLANT
CATH SWAN GANZ 7F STRAIGHT (CATHETERS) IMPLANT
ELECT DEFIB PAD ADLT CADENCE (PAD) IMPLANT
KIT HEART LEFT (KITS) ×2 IMPLANT
PACK CARDIAC CATHETERIZATION (CUSTOM PROCEDURE TRAY) ×2 IMPLANT
SHEATH PINNACLE 5F 10CM (SHEATH) IMPLANT
SHEATH PINNACLE 7F 10CM (SHEATH) IMPLANT
SHEATH PROBE COVER 6X72 (BAG) IMPLANT
TRANSDUCER W/STOPCOCK (MISCELLANEOUS) ×2 IMPLANT
TUBING CIL FLEX 10 FLL-RA (TUBING) ×2 IMPLANT
WIRE EMERALD 3MM-J .035X150CM (WIRE) IMPLANT

## 2022-05-30 NOTE — Interval H&P Note (Signed)
Cath Lab Visit (complete for each Cath Lab visit)  Clinical Evaluation Leading to the Procedure:   ACS: Yes.    Non-ACS:    Anginal Classification: CCS IV  Anti-ischemic medical therapy: Minimal Therapy (1 class of medications)  Non-Invasive Test Results: No non-invasive testing performed  Prior CABG: No previous CABG      History and Physical Interval Note:  05/30/2022 2:31 PM  [name] [name]  has presented today for surgery, with the diagnosis of unstable angina.  The various methods of treatment have been discussed with the patient and family. After consideration of risks, benefits and other options for treatment, the patient has consented to  Procedure(s): LEFT HEART CATH AND CORONARY ANGIOGRAPHY (N/A) as a surgical intervention.  The patient's history has been reviewed, patient examined, no change in status, stable for surgery.  I have reviewed the patient's chart and labs.  Questions were answered to the patient's satisfaction.     Larae Grooms

## 2022-05-30 NOTE — H&P (View-Only) (Signed)
Cardiology Consultation   Patient ID: [name] [name] MRN: [id number]; DOB: March 19, 1951  Admit date: 05/30/2022 Date of Consult: 05/30/2022  PCP:  Carrolyn Meiers, [place] [facility] Cardiologist:  Pixie Casino, MD        Patient Profile:   [name] [name] is a 71 y.o. male with a hx of IDDM, HLD, HTN,, OSA on CPAP, ESRD on peritoneal dialysis, CAD with DES to RCA 07/2021-and  DES X 2 12/2021 early restenosis 01/2022 with DES placed RCA who is being seen 05/30/2022 for the evaluation of chest pain at the request of Dr. Alvino Chapel.  History of Present Illness:   [name] [name] with hx as above including NSTEMI 07/27/21 with DES to RCA and PTCA alone X 2,  EF 55-60% severe LVH, G1DD.  12/27/21 admitted with unstable angina and cath with single vessel disease with sequential 90-95% mid RCA stenosis at sites of angioplasty in 07/2021 with overlapping stents.  Echo with EF 60-65%,  mild LVH, mild to mod AS.  01/21/22 admitted with chest pain and cath with severe early in stent restenosis in mRCA, with DES added.  Limited echo at that time no AS, EF 65-70%.     He did have Mobitz 1 AV block BB stopped.  Wore outpt monitor revealed CHB and [name] 02/2022. Monitor with bradycardia with second-degree AV block type I first-degree AV block and electrocardiograms after which he was submitted for a event recorder demonstrating second-degree AV block 2: 1 and nocturnal complete heart block --Dr. Caryl Comes noted CHB nocturnal and pt not always compliant with CPAP.  Daytime brady is at rest and his heart rate excursion on his monitor is normal.  At this juncture there is no indication for pacing.Marland Kitchen  Pt presents with chest pain, similar to other admits with [place], NSTEMI.  He has taken NTG X 3 and ASA  EKG:  The EKG was personally reviewed and demonstrates:  Mobitz 1 block  HR 48 non specific ST abnormalities no acute ST elevation  Telemetry:  Telemetry was personally reviewed  and demonstrates:  Mobitz 1 HR to 43  Sitting on side of bed mild discomfort but if moves around much worse. This is similar to MI in Jan 2023.  Since then he has some mild discomfort but over last few weeks with walking he had chest burning and pain more sharp but it was sharp in Jan 2023.  No nausea SOB or diaphoresis.  The pain over at least one weak has increased in severity.  With rest it resolves,  he tries to work through it but by last pm he could not.  Pain was 10/10, he called EMS and took NTG the NTG relieved the pain but it would return.  He took 8 of 81 mg ASA.  Initially he declined to come to ER but pain returned after EMS left so he called them back and came to the hospital.  His peritoneal dialysis is at night,  he does have AV graft.    He is using his CPAP again, had it fixed.  No lightheadedness, syncope or dizziness.    Na 136, K+ 3.6 BUN 44 Cr 17.29 hs troponin 100 and 142 Hgb 8.9 WBC 4.4 plts 126  2V CXR NAD   BP 141/72 to 119/80 afebrile, HR 46 to 56 R 15-17  Past Medical History:  Diagnosis Date   Anemia    Arthritis    Asthma  Bell palsy    CAD (coronary artery disease)    a. 2014 MV: abnl w/ infap ischemia; b. 03/2013 Cath: aneurysmal bleb in the LAD w/ otw nonobs dzs-->Med Rx.   Chronic back pain    Chronic knee pain    a. 09/2015 s/p R TKA.   Chronic pain    Chronic shoulder pain    Chronic sinusitis    COPD (chronic obstructive pulmonary disease) ([place])    Diabetes mellitus without complication ([place])    type II    ESRD on peritoneal dialysis ([place])    on peritoneal dialysis, [facility]   Essential hypertension    GERD (gastroesophageal reflux disease)    Gout    Gout    Hepatomegaly    noted on noncontrast CT 2015   History of hiatal hernia    Hyperlipidemia    Lateral meniscus tear    Obesity    Truncal   Obstructive sleep apnea    does not use cpap    On home oxygen therapy    uses 2l when is going somewhere per patient    PUD (peptic  ulcer disease)    remote, reports f/u EGD about 8 years ago unremarkable    Reactive airway disease    related to exposure to chemical during 9/11   Sinusitis    Vitamin D deficiency     Past Surgical History:  Procedure Laterality Date   ASAD LT SHOULDER  12/15/2008   left shoulder   AV FISTULA PLACEMENT Left 08/09/2016   Procedure: BRACHIOCEPHALIC ARTERIOVENOUS (AV) FISTULA CREATION LEFT ARM;  Surgeon: [name], MD;  Location: [facility];  Service: Vascular;  Laterality: Left;   CAPD INSERTION N/A 10/07/2018   Procedure: LAPAROSCOPIC PERITONEAL CATHETER PLACEMENT;  Surgeon: Mickeal Skinner, MD;  Location: [facility];  Service: General;  Laterality: N/A;   CATARACT EXTRACTION W/PHACO Left 03/28/2016   Procedure: CATARACT EXTRACTION PHACO AND INTRAOCULAR LENS PLACEMENT LEFT EYE;  Surgeon: [name], MD;  Location: [facility];  Service: Ophthalmology;  Laterality: Left;  CDE: 4.77   CATARACT EXTRACTION W/PHACO Right 04/11/2016   Procedure: CATARACT EXTRACTION PHACO AND INTRAOCULAR LENS PLACEMENT RIGHT EYE; CDE:  4.74;  Surgeon: [name], MD;  Location: [facility];  Service: Ophthalmology;  Laterality: Right;   COLONOSCOPY  10/15/2008   Fields: Rectal polyp obliterated, not retrieved, hemorrhoids, single ascending colon diverticulum near the CV. Next colonoscopy April 2020   COLONOSCOPY N/A 12/25/2014   SLF: 1. Colorectal polyps (2) removed 2. Small internal hemorrhoids 3. the left colon is severely redundant. hyperplastic polyps   CORONARY STENT INTERVENTION N/A 07/25/2021   Procedure: CORONARY STENT INTERVENTION;  Surgeon: Sherren Mocha, MD;  Location: [facility];  Service: Cardiovascular;  Laterality: N/A;   CORONARY STENT INTERVENTION N/A 12/26/2021   Procedure: CORONARY STENT INTERVENTION;  Surgeon: Nelva Bush, MD;  Location: [facility];  Service: Cardiovascular;  Laterality: N/A;   CORONARY STENT INTERVENTION N/A 01/20/2022   Procedure: CORONARY STENT  INTERVENTION;  Surgeon: Sherren Mocha, MD;  Location: [facility];  Service: Cardiovascular;  Laterality: N/A;   DOPPLER ECHOCARDIOGRAPHY     ESOPHAGOGASTRODUODENOSCOPY N/A 12/25/2014   SLF: 1. Anemia most likely due to CRI, gastritis, gastric polyps 2. Moderate non-erosive gastriits and mild duodenitis.  3.TWo large gstric polyps removed.    EYE SURGERY  12/22/2010   tear duct probing-[place]   FOREIGN BODY REMOVAL  03/29/2011   Procedure: REMOVAL FOREIGN BODY EXTREMITY;  Surgeon: Dorothyann Peng  Aline Brochure, MD;  Location: [facility];  Service: Orthopedics;  Laterality: Right;  Removal Foreign Body Right Thumb   IR FLUORO GUIDE CV LINE RIGHT  08/06/2018   IR US GUIDE VASC ACCESS RIGHT  08/06/2018   KNEE ARTHROSCOPY  10/16/2007   left   KNEE ARTHROSCOPY WITH LATERAL MENISECTOMY Right 10/14/2015   Procedure: LEFT KNEE ARTHROSCOPY WITH PARTIAL LATERAL MENISECTOMY;  Surgeon: Carole Civil, MD;  [facility];  Service: Orthopedics;  Laterality: Right;   LEFT HEART CATH AND CORONARY ANGIOGRAPHY N/A 07/25/2021   Procedure: LEFT HEART CATH AND CORONARY ANGIOGRAPHY;  Surgeon: Sherren Mocha, MD;  Location: [facility];  Service: Cardiovascular;  Laterality: N/A;   LEFT HEART CATH AND CORONARY ANGIOGRAPHY N/A 12/26/2021   Procedure: LEFT HEART CATH AND CORONARY ANGIOGRAPHY;  Surgeon: Nelva Bush, MD;  Location: [facility];  Service: Cardiovascular;  Laterality: N/A;   LEFT HEART CATH AND CORONARY ANGIOGRAPHY N/A 01/20/2022   Procedure: LEFT HEART CATH AND CORONARY ANGIOGRAPHY;  Surgeon: Sherren Mocha, MD;  Location: [facility];  Service: Cardiovascular;  Laterality: N/A;   LEFT HEART CATHETERIZATION WITH CORONARY ANGIOGRAM N/A 03/28/2013   Procedure: LEFT HEART CATHETERIZATION WITH CORONARY ANGIOGRAM;  Surgeon: Leonie Man, MD;  [facility];  Service: Cardiovascular;  Laterality: N/A;   NM MYOVIEW LTD     PENILE PROSTHESIS IMPLANT N/A 08/16/2015    Procedure: PENILE PROTHESIS INFLATABLE, three piece, Excisional biopsy of Penile ulcer, Penile molding;  Surgeon: Carolan Clines, MD;  [facility];  Service: Urology;  Laterality: N/A;   PENILE PROSTHESIS IMPLANT N/A 12/24/2017   Procedure: REMOVAL AND  REPLACEMENT  COLOPLAST PENILE PROSTHESIS;  Surgeon: Lucas Mallow, MD;  [facility];  Service: Urology;  Laterality: N/A;   QUADRICEPS TENDON REPAIR  07/21/2011   Procedure: REPAIR QUADRICEP TENDON;  Surgeon: Arther Abbott, MD;  [facility];  Service: Orthopedics;  Laterality: Right;   TOENAIL EXCISION     removed [id number]   UMBILICAL HERNIA REPAIR  07/17/2005   [place]     Home Medications:  Prior to Admission medications   Medication Sig Start Date End Date Taking? Authorizing Provider  albuterol (VENTOLIN HFA) 108 (90 Base) MCG/ACT inhaler Inhale 2 puffs into the lungs every 6 (six) hours as needed for wheezing or shortness of breath. 03/25/21  Yes Rigoberto Noel, MD  aspirin EC 81 MG tablet Take 81 mg by mouth daily. Swallow whole.   Yes [provider]  calcitRIOL (ROCALTROL) 0.25 MCG capsule Take 0.25 mcg by mouth daily.   Yes [provider]  insulin glargine (LANTUS SOLOSTAR) 100 UNIT/ML Solostar Pen Inject 22 Units into the skin at bedtime. 04/12/22  Yes Brita Romp, NP  isosorbide mononitrate (IMDUR) 60 MG 24 hr tablet TAKE 1 TABLET(60 MG) BY MOUTH DAILY Patient taking differently: Take 60 mg by mouth daily. 05/05/22  Yes Troy Sine, MD  nitroGLYCERIN (NITROSTAT) 0.4 MG SL tablet Place 1 tablet (0.4 mg total) under the tongue every 5 (five) minutes as needed for chest pain. 12/14/21  Yes Maudie Flakes, MD  pantoprazole (PROTONIX) 40 MG tablet Take 1 tablet (40 mg total) by mouth 2 (two) times daily before a meal. 07/25/21  Yes Erenest Rasher, PA-C  sevelamer (RENAGEL) 800 MG tablet Take 2,400 mg by mouth 3 (three) times daily with meals. Take 1 tablet with snacks   Yes  [provider]  simvastatin (ZOCOR) 20 MG tablet Take  20 mg by mouth every morning.   Yes [provider]  ticagrelor (BRILINTA) 90 MG TABS tablet TAKE 1 TABLET(90 MG) BY MOUTH TWICE DAILY Patient taking differently: Take 90 mg by mouth 2 (two) times daily. 04/10/22  Yes Hilty, Nadean Corwin, MD  torsemide (DEMADEX) 100 MG tablet Take 100 mg by mouth daily. 05/03/21  Yes [provider]  TRADJENTA 5 MG TABS tablet TAKE 1 TABLET EVERY DAY Patient taking differently: Take 5 mg by mouth daily. 05/01/22  Yes Brita Romp, NP  DROPLET PEN NEEDLES 31G X 5 MM MISC USE TO INJECT INSULIN DAILY AS DIRECTED 03/17/22   Nida, Marella Chimes, MD  ONETOUCH ULTRA test strip USE TO CHECK BLOOD SUGAR TWICE DAILY 03/21/22   Brita Romp, NP  Vitamin D, Ergocalciferol, (DRISDOL) 1.25 MG (50000 UNIT) CAPS capsule Take 50,000 Units by mouth every 7 (seven) days. Monday Patient not taking: Reported on 05/30/2022 05/03/21   [provider]    Inpatient Medications: Scheduled Meds:  [START ON 05/31/2022] aspirin EC  81 mg Oral Daily   calcitRIOL  0.25 mcg Oral Daily   insulin aspart  0-6 Units Subcutaneous TID WC   insulin glargine-yfgn  15 Units Subcutaneous Daily   linagliptin  5 mg Oral Daily   pantoprazole  40 mg Oral BID AC   sevelamer carbonate  1,600 mg Oral TID WC   simvastatin  20 mg Oral q morning   ticagrelor  90 mg Oral BID   torsemide  100 mg Oral Daily   Continuous Infusions:  heparin 1,100 Units/hr (05/30/22 0857)   PRN Meds:   Allergies:    Allergies  Allergen Reactions   Opana [Oxymorphone Hcl] Itching   Tramadol Itching    Social History:   Social History   Socioeconomic History   Marital status: Married    Spouse name: Not on file   Number of children: 2   Years of education: 12th grade   Highest education level: Not on file  Occupational History   Occupation: disabled   Occupation:      Fish farm manager: UNEMPLOYED  Tobacco Use    Smoking status: Former    Packs/day: 1.00    Years: 25.00    Total pack years: 25.00    Types: Cigarettes    Quit date: 03/27/2010    Years since quitting: 12.1   Smokeless tobacco: Never   Tobacco comments:    Quit x 7 years  Vaping Use   Vaping Use: Never used  Substance and Sexual Activity   Alcohol use: Not Currently    Comment: occasionally   Drug use: Yes    Types: Marijuana    Comment: cocaine- last time used- 11/24/2017 , marijuana-    Sexual activity: Yes  Other Topics Concern   Not on file  Social History Narrative   He quit smoking in 2010. 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 He developed pulmonary problems, became disabled because of lower airway disease in 2009.       WATCHES BASKETBALL. HIS TEAM IS [place].   Social Determinants of Health   Financial Resource Strain: Low Risk  (12/29/2021)   Overall Financial Resource Strain (CARDIA)    Difficulty of Paying Living Expenses: Not hard at all  Food Insecurity: No Food Insecurity (02/01/2022)   Hunger Vital Sign    Worried About Running Out of Food in the Last Year: Never true    Ran Out of Food  in the Last Year: Never true  Transportation Needs: No Transportation Needs (02/17/2022)   PRAPARE - Hydrologist (Medical): No    Lack of Transportation (Non-Medical): No  Physical Activity: Sufficiently Active (12/29/2021)   Exercise Vital Sign    Days of Exercise per Week: 3 days    Minutes of Exercise per Session: 60 min  Stress: No Stress Concern Present (12/29/2021)   [place]    Feeling of Stress : Not at all  Social Connections: [place] (12/29/2021)   Social Connection and Isolation Panel [NHANES]    Frequency of Communication with Friends and Family: More than three times a week    Frequency of Social Gatherings with Friends and Family: More than three times a week     Attends Religious Services: More than 4 times per year    Active Member of [employer] or Organizations: Yes    Attends Music therapist: More than 4 times per year    Marital Status: Married  Human resources officer Violence: Not At Risk (12/29/2021)   Humiliation, Afraid, Rape, and Kick questionnaire    Fear of Current or Ex-Partner: No    Emotionally Abused: No    Physically Abused: No    Sexually Abused: No    Family History:    Family History  Problem Relation Age of Onset   Hypertension Mother        MI   Cancer Mother        breast    Diabetes Mother    Diabetes Father    Hypertension Father    Hypertension Sister    Diabetes Sister    Arthritis Other    Asthma Other    Lung disease Other    Anesthesia problems Neg Hx    Hypotension Neg Hx    Malignant hyperthermia Neg Hx    Pseudochol deficiency Neg Hx    Colon cancer Neg Hx      ROS:  Please see the history of present illness.  General:no colds or fevers, no weight changes Skin:no rashes or ulcers HEENT:no blurred vision, no congestion CV:see HPI PUL:see HPI GI:no diarrhea constipation or melena, no indigestion GU:no hematuria, no dysuria MS:no joint pain, no claudication Neuro:no syncope, no lightheadedness Endo:no diabetes, no thyroid disease  All other ROS reviewed and negative.     Physical Exam/Data:   Vitals:   05/30/22 1015 05/30/22 1030 05/30/22 1045 05/30/22 1100  BP: 139/82 137/82 137/73 119/80  Pulse: (!) 128 (!) 125 (!) 119 (!) 55  Resp: _0 Temp:      TempSrc:      SpO2: 96% 96% 97% 98%  Weight:      Height:       No intake or output data in the 24 hours ending 05/30/22 1144    05/30/2022    3:46 AM 05/03/2022   10:21 AM 05/02/2022   11:47 AM  Last 3 Weights  Weight (lbs) 217 lb 220 lb 9.6 oz 217 lb 6.4 oz  Weight (kg) 98.431 kg 100.064 kg 98.612 kg     Body mass index is 37.25 kg/m.  General:  Well nourished, well developed, in no acute distress HEENT:  normal Neck: no JVD Vascular: No carotid bruits; Distal pulses 2+ bilaterally Cardiac:  normal S1, S2; RRR; no murmur gallup rub or click Lungs:  clear to auscultation bilaterally, no wheezing, rhonchi or rales  Abd: soft, nontender,  no hepatomegaly  Ext: no edema Musculoskeletal:  No deformities, BUE and BLE strength normal and equal Skin: warm and dry  Neuro:  alert and oriented X 3 MAE follows commands, no focal abnormalities noted Psych:  Normal affect     Relevant CV Studies: Most recent cath 01/20/2022  Diagnostic Dominance: Right  Intervention     1.  Stable nonobstructive plaquing in the left main, LAD and left circumflex vessels 2.  Severe early in-stent restenosis in the mid RCA, treated successfully with a 4.0 x 16 mm Synergy DES postdilated to high-pressure with a 4.5 mm noncompliant balloon. 3.  Normal LVEDP  Recommendations: Continue dual antiplatelet therapy with aspirin and ticagrelor at least 12 months, consider indefinite antiplatelet therapy in this patient with end-stage renal disease and multiple PCI procedures.  From a cardiac perspective, the patient should be eligible for hospital discharge tomorrow   Echo 01/19/22     ECHOCARDIOGRAM LIMITED REPORT       Patient Name:   [name] [name] Date of Exam: 01/19/2022 Medical Rec #:  [id number]      Height:       64.0 in Accession #:    [id number]     Weight:       223.8 lb Date of Birth:  11-Nov-1950     BSA:          2.052 m Patient Age:    61 years       BP:           127/64 mmHg Patient Gender: M              HR:           63 bpm. Exam Location:  Inpatient  Procedure: Limited Echo, Limited Color Doppler and Cardiac Doppler  Indications:    Chest Pain R07.9   History:        Patient has prior history of Echocardiogram examinations, most                 recent 12/27/2021. CAD, COPD; Risk Factors:Hypertension and                 Dyslipidemia. End stage renal disease                 1st degree HB.    Sonographer:    Ronny Flurry Sonographer#2:  Darlina Sicilian RDCS Referring Phys: [id number] [facility] A CHANDRASEKHAR  IMPRESSIONS    1. Left ventricular ejection fraction, by estimation, is 65 to 70%. The left ventricle has normal function.  2. The mitral valve is grossly normal. No evidence of mitral valve regurgitation.  3. The aortic valve is calcified. There is mild calcification of the aortic valve. There is mild thickening of the aortic valve. Aortic valve regurgitation is not visualized. Aortic valve sclerosis is present, with no evidence of aortic valve stenosis. Aortic valve mean gradient measures 9.6 mmHg. Aortic valve Vmax measures 2.35 m/s.  FINDINGS  Left Ventricle: Left ventricular ejection fraction, by estimation, is 65 to 70%. The left ventricle has normal function.  Mitral Valve: The mitral valve is grossly normal. Mild mitral annular calcification.  Tricuspid Valve: The tricuspid valve is grossly normal. Tricuspid valve regurgitation is trivial.  Aortic Valve: The aortic valve is calcified. There is mild calcification of the aortic valve. There is mild thickening of the aortic valve. Aortic valve regurgitation is not visualized. Aortic valve sclerosis is present, with no evidence of aortic valve stenosis. Aortic valve mean gradient  measures 9.6 mmHg. Aortic valve peak gradient measures 22.1 mmHg. Aortic valve area, by VTI measures 1.17 cm.  Aorta: The aortic root was not well visualized.    monitor 12/2021  Patient had a min HR of 31 bpm, max HR of 125 bpm, and avg HR of 62 bpm. Predominant underlying rhythm was Sinus Rhythm. First Degree AV Block was present. Bundle Branch Block/IVCD was present. QRS morphology changes were present throughout recording. 4  episode(s) of AV Block (3rd) occurred, lasting a total of 49 secs. Second Degree AV Block-Mobitz I (Wenckebach) was present. Isolated SVEs were rare (<1.0%), and no SVE Couplets or SVE Triplets were  present. Isolated VEs were occasional (3.1%, [id number]),  VE Couplets were rare (<1.0%, 1), and VE Triplets were rare (<1.0%, 1). MD notification criteria for Complete Heart Block met - report posted prior to notification per account request (MA).   Laboratory Data:  High Sensitivity Troponin:   Recent Labs  Lab 05/30/22 0341 05/30/22 0640  TROPONINIHS 100* 142*     Chemistry Recent Labs  Lab 05/30/22 0341  NA 136  K 3.6  CL 96*  CO2 26  GLUCOSE 128*  BUN 44*  CREATININE 17.29*  CALCIUM 9.9  GFRNONAA 3*  ANIONGAP 14    No results for input(s): "PROT", "ALBUMIN", "AST", "ALT", "ALKPHOS", "BILITOT" in the last 168 hours. Lipids No results for input(s): "CHOL", "TRIG", "HDL", "LABVLDL", "LDLCALC", "CHOLHDL" in the last 168 hours.  Hematology Recent Labs  Lab 05/30/22 0341  WBC 4.4  RBC 2.85*  HGB 8.9*  HCT 26.8*  MCV 94.0  MCH 31.2  MCHC 33.2  RDW 15.0  PLT 126*   Thyroid No results for input(s): "TSH", "FREET4" in the last 168 hours.  BNPNo results for input(s): "BNP", "PROBNP" in the last 168 hours.  DDimer No results for input(s): "DDIMER" in the last 168 hours.   Radiology/Studies:  DG Chest 2 View  Result Date: 05/30/2022 CLINICAL DATA:  Chest pain EXAM: CHEST - 2 VIEW COMPARISON:  01/18/2022 FINDINGS: The heart size and mediastinal contours are within normal limits. Atheromatous calcification both lungs are clear. The visualized skeletal structures are unremarkable. IMPRESSION: No active cardiopulmonary disease. Electronically Signed   By: Jorje Guild M.D.   On: 05/30/2022 04:18     Assessment and Plan:   [place]/NSTEMI with increasing chest pain.  At rest is comfortable. Has had ASA and NTG.  His meds did change in October, with stopping Ranexa and decrease of Imdur to 60- he was very weak and tired on both.  His losartan was stopped as well.  I ordered 3rd troponin.  He is on IV heparin.  Possible cath, will defer to Dr. Stanford Breed.  ? Increase imdur  CAD with  significant RCA disease with NSTEMI in 07/2021 with stent to RCA, June -[place] with 2 stents to RCA at prior angioplasty sites, and then early re-stenosis in July with mRCA stent.  He also has non obstructive disease in Lt main and LAD, LCX up to 50%.   Mobitz I and nocturnal CHB, now using his CPAP more.  HR awake on side of bed 40-50s.  No symptoms  (has been evaluated by Dr. Caryl Comes and with exertion HR increased, CHB only nocturnal)  HLD on statin continue OSA with CPAP continue IDDM per IM ESRD on peritoneal dialysis at night -he does have fistula. Per nephrology.  (He continues on Demadex 100 daily)  Risk Assessment/Risk Scores:     TIMI Risk Score for Unstable Angina  or Non-ST Elevation MI:   The patient's TIMI risk score is 6, which indicates a 41% risk of all cause mortality, new or recurrent myocardial infarction or need for urgent revascularization in the next 14 days.  For questions or updates, please contact [place] Please consult [email or web address] for contact info under   Signed, Cecilie Kicks, NP  05/30/2022 11:44 AM As above, patient seen and examined.  Briefly he is a 71 year old male with past medical history of diabetes mellitus, hypertension, hyperlipidemia, obstructive sleep apnea, end-stage renal disease on peritoneal dialysis, coronary artery disease with history of PCI of the right coronary artery for evaluation of unstable angina.  Patient states that since his last PCI in July he has had stable angina described as a burning pain in the substernal area followed by sharp pain in the left chest.  This occurs after walking 1 mile and typically resolves with stopping his activities.  However beginning last night his symptoms occurred with minimal activity and also occurred at rest.  His symptoms are similar to his prior infarct pain.  He is admitted and cardiology now asked to evaluate. Electrocardiogram shows sinus rhythm with Mobitz 1 second-degree AV block, nonspecific  ST changes.  Troponins are 101 142.  Creatinine is 17.29.  Hemoglobin 8.9 with normal MCV.  Chest x-ray with no infiltrates.  1 unstable angina-patient's symptoms are identical to his infarct pain.  They have increased in severity over the past 24 hours.  Plan will be to proceed with cardiac catheterization.  The risk and benefits including myocardial infarction, CVA and death discussed and he agrees to proceed.  Would repeat echocardiogram to reassess LV function.  Continue aspirin, Brilinta, heparin and statin.  2 end-stage renal disease-on peritoneal dialysis.  We will ask nephrology to see.  Note patient is on Demadex which may be able to be discontinued.  3 Mobitz 1 second-degree AV block-noted on ECG and telemetry.  He has been found to have Mobitz 1 second-degree AV block, 2-1 AV block and previously found to have complete heart block at night.  He saw Dr. Caryl Comes and this was felt secondary to not using CPAP.  We will continue to follow but no indication for pacemaker at present.  4 hyperlipidemia-continue statin.  5 obstructive sleep apnea-continue CPAP.  Kirk Ruths

## 2022-05-30 NOTE — H&P (Signed)
History and Physical    [name] [name] WRU:[id number] DOB: 09-20-1950 DOA: 05/30/2022  PCP: Carrolyn Meiers, MD (Confirm with patient/family/NH records and if not entered, this has to be entered at [facility] point of entry) Patient coming from: Home  I have personally briefly reviewed patient's old medical records in [place]  Chief Complaint: Chest pain  HPI: [name] [name] is a 71 y.o. male with medical history significant of unstable angina, CAD with chronic RCA in-stent stenosis limited, ESRD on PD, anemia secondary to CKD, COPD, presented with worsening of chest pains.  Patient has a known RCA stenosis including in-stent stenosis identified in the cardiac cath earlier this year, for which patient has been following with cardiologist and has been on dual antiplatelet therapy including aspirin and Brilinta.  Despite, patient has had unstable angina episodes at home.  He reported usually those episodes responded to nitroglycerin, however last night, about 10 PM, patient started to have severe chest pain, pressure-like, associated with shortness of breath, he immediately took 3 nitroglycerin, and chest pain partial relief but in the matter of 20 to 30 minutes, chest pain came back again. ED Course: Patient was given nitroglycerin sublingual and then switched to Nitropaste and chest pain partially relieved.  Blood pressure elevated, x-ray showed mild pulmonary congestion but no hypoxia.  Troponin trending 100>142  Review of Systems: As per HPI otherwise 14 point review of systems negative.   Past Medical History:  Diagnosis Date   Anemia    Arthritis    Asthma    Bell palsy    CAD (coronary artery disease)    a. 2014 MV: abnl w/ infap ischemia; b. 03/2013 Cath: aneurysmal bleb in the LAD w/ otw nonobs dzs-->Med Rx.   Chronic back pain    Chronic knee pain    a. 09/2015 s/p R TKA.   Chronic pain    Chronic shoulder pain    Chronic sinusitis    COPD (chronic obstructive  pulmonary disease) ([place])    Diabetes mellitus without complication ([place])    type II    ESRD on peritoneal dialysis ([place])    on peritoneal dialysis, [facility]    Essential hypertension    GERD (gastroesophageal reflux disease)    Gout    Gout    Hepatomegaly    noted on noncontrast CT 2015   History of hiatal hernia    Hyperlipidemia    Lateral meniscus tear    Obesity    Truncal   Obstructive sleep apnea    does not use cpap    On home oxygen therapy    uses 2l when is going somewhere per patient    PUD (peptic ulcer disease)    remote, reports f/u EGD about 8 years ago unremarkable    Reactive airway disease    related to exposure to chemical during 9/11   Sinusitis    Vitamin D deficiency     Past Surgical History:  Procedure Laterality Date   ASAD LT SHOULDER  12/15/2008   left shoulder   AV FISTULA PLACEMENT Left 08/09/2016   Procedure: BRACHIOCEPHALIC ARTERIOVENOUS (AV) FISTULA CREATION LEFT ARM;  Surgeon: [name], MD;  Location: [facility];  Service: Vascular;  Laterality: Left;   CAPD INSERTION N/A 10/07/2018   Procedure: LAPAROSCOPIC PERITONEAL CATHETER PLACEMENT;  Surgeon: Mickeal Skinner, MD;  [facility];  Service: General;  Laterality: N/A;   CATARACT EXTRACTION W/PHACO Left 03/28/2016   Procedure: CATARACT EXTRACTION PHACO AND INTRAOCULAR  LENS PLACEMENT LEFT EYE;  Surgeon: [name], MD;  Location: [facility];  Service: Ophthalmology;  Laterality: Left;  CDE: 4.77   CATARACT EXTRACTION W/PHACO Right 04/11/2016   Procedure: CATARACT EXTRACTION PHACO AND INTRAOCULAR LENS PLACEMENT RIGHT EYE; CDE:  4.74;  Surgeon: [name], MD;  [facility]: [facility];  Service: Ophthalmology;  Laterality: Right;   COLONOSCOPY  10/15/2008   Fields: Rectal polyp obliterated, not retrieved, hemorrhoids, single ascending colon diverticulum near the CV. Next colonoscopy April 2020   COLONOSCOPY N/A 12/25/2014   SLF: 1. Colorectal polyps (2) removed 2. Small internal  hemorrhoids 3. the left colon is severely redundant. hyperplastic polyps   CORONARY STENT INTERVENTION N/A 07/25/2021   Procedure: CORONARY STENT INTERVENTION;  Surgeon: Sherren Mocha, MD;  Location: [place] [facility];  Service: Cardiovascular;  Laterality: N/A;   CORONARY STENT INTERVENTION N/A 12/26/2021   Procedure: CORONARY STENT INTERVENTION;  Surgeon: Nelva Bush, MD;  Location: [place] [facility];  Service: Cardiovascular;  Laterality: N/A;   CORONARY STENT INTERVENTION N/A 01/20/2022   Procedure: CORONARY STENT INTERVENTION;  Surgeon: Sherren Mocha, MD;  Location: [place] [facility];  Service: Cardiovascular;  Laterality: N/A;   DOPPLER ECHOCARDIOGRAPHY     ESOPHAGOGASTRODUODENOSCOPY N/A 12/25/2014   SLF: 1. Anemia most likely due to CRI, gastritis, gastric polyps 2. Moderate non-erosive gastriits and mild duodenitis.  3.TWo large gstric polyps removed.    EYE SURGERY  12/22/2010   tear duct probing-[place]   FOREIGN BODY REMOVAL  03/29/2011   Procedure: REMOVAL FOREIGN BODY EXTREMITY;  Surgeon: Arther Abbott, MD;  [facility];  Service: Orthopedics;  Laterality: Right;  Removal Foreign Body Right Thumb   IR FLUORO GUIDE CV LINE RIGHT  08/06/2018   IR US GUIDE VASC ACCESS RIGHT  08/06/2018   KNEE ARTHROSCOPY  10/16/2007   left   KNEE ARTHROSCOPY WITH LATERAL MENISECTOMY Right 10/14/2015   Procedure: LEFT KNEE ARTHROSCOPY WITH PARTIAL LATERAL MENISECTOMY;  Surgeon: Carole Civil, MD;  [facility];  Service: Orthopedics;  Laterality: Right;   LEFT HEART CATH AND CORONARY ANGIOGRAPHY N/A 07/25/2021   Procedure: LEFT HEART CATH AND CORONARY ANGIOGRAPHY;  Surgeon: Sherren Mocha, MD;  Location: [place] [facility];  Service: Cardiovascular;  Laterality: N/A;   LEFT HEART CATH AND CORONARY ANGIOGRAPHY N/A 12/26/2021   Procedure: LEFT HEART CATH AND CORONARY ANGIOGRAPHY;  Surgeon: Nelva Bush, MD;  Location: [place] [facility];  Service: Cardiovascular;  Laterality:  N/A;   LEFT HEART CATH AND CORONARY ANGIOGRAPHY N/A 01/20/2022   Procedure: LEFT HEART CATH AND CORONARY ANGIOGRAPHY;  Surgeon: Sherren Mocha, MD;  Location: [place] [facility];  Service: Cardiovascular;  Laterality: N/A;   LEFT HEART CATHETERIZATION WITH CORONARY ANGIOGRAM N/A 03/28/2013   Procedure: LEFT HEART CATHETERIZATION WITH CORONARY ANGIOGRAM;  Surgeon: Leonie Man, MD;  [facility];  Service: Cardiovascular;  Laterality: N/A;   NM MYOVIEW LTD     PENILE PROSTHESIS IMPLANT N/A 08/16/2015   Procedure: PENILE PROTHESIS INFLATABLE, three piece, Excisional biopsy of Penile ulcer, Penile molding;  Surgeon: Carolan Clines, MD;  [facility];  Service: Urology;  Laterality: N/A;   PENILE PROSTHESIS IMPLANT N/A 12/24/2017   Procedure: REMOVAL AND  REPLACEMENT  COLOPLAST PENILE PROSTHESIS;  Surgeon: Lucas Mallow, MD;  [facility];  Service: Urology;  Laterality: N/A;   QUADRICEPS TENDON REPAIR  07/21/2011   Procedure: REPAIR QUADRICEP TENDON;  Surgeon: Arther Abbott, MD;  [facility];  Service: Orthopedics;  Laterality:  Right;   TOENAIL EXCISION     removed [id number]   UMBILICAL HERNIA REPAIR  07/17/2005   [place]     reports that he quit smoking about 12 years ago. His smoking use included cigarettes. He has a 25.00 pack-year smoking history. He has never used smokeless tobacco. He reports that he does not currently use alcohol. He reports current drug use. Drug: Marijuana.  Allergies  Allergen Reactions   Opana [Oxymorphone Hcl] Itching   Tramadol Itching    Family History  Problem Relation Age of Onset   Hypertension Mother        MI   Cancer Mother        breast    Diabetes Mother    Diabetes Father    Hypertension Father    Hypertension Sister    Diabetes Sister    Arthritis Other    Asthma Other    Lung disease Other    Anesthesia problems Neg Hx    Hypotension Neg Hx    Malignant hyperthermia Neg Hx    Pseudochol deficiency  Neg Hx    Colon cancer Neg Hx      Prior to Admission medications   Medication Sig Start Date End Date Taking? Authorizing Provider  albuterol (VENTOLIN HFA) 108 (90 Base) MCG/ACT inhaler Inhale 2 puffs into the lungs every 6 (six) hours as needed for wheezing or shortness of breath. 03/25/21  Yes Rigoberto Noel, MD  aspirin EC 81 MG tablet Take 81 mg by mouth daily. Swallow whole.   Yes [provider]  calcitRIOL (ROCALTROL) 0.25 MCG capsule Take 0.25 mcg by mouth daily.   Yes [provider]  insulin glargine (LANTUS SOLOSTAR) 100 UNIT/ML Solostar Pen Inject 22 Units into the skin at bedtime. 04/12/22  Yes Brita Romp, NP  isosorbide mononitrate (IMDUR) 60 MG 24 hr tablet TAKE 1 TABLET(60 MG) BY MOUTH DAILY Patient taking differently: Take 60 mg by mouth daily. 05/05/22  Yes Troy Sine, MD  nitroGLYCERIN (NITROSTAT) 0.4 MG SL tablet Place 1 tablet (0.4 mg total) under the tongue every 5 (five) minutes as needed for chest pain. 12/14/21  Yes Maudie Flakes, MD  pantoprazole (PROTONIX) 40 MG tablet Take 1 tablet (40 mg total) by mouth 2 (two) times daily before a meal. 07/25/21  Yes Erenest Rasher, PA-C  sevelamer (RENAGEL) 800 MG tablet Take 2,400 mg by mouth 3 (three) times daily with meals. Take 1 tablet with snacks   Yes [provider]  simvastatin (ZOCOR) 20 MG tablet Take 20 mg by mouth every morning.   Yes [provider]  ticagrelor (BRILINTA) 90 MG TABS tablet TAKE 1 TABLET(90 MG) BY MOUTH TWICE DAILY Patient taking differently: Take 90 mg by mouth 2 (two) times daily. 04/10/22  Yes Hilty, Nadean Corwin, MD  torsemide (DEMADEX) 100 MG tablet Take 100 mg by mouth daily. 05/03/21  Yes [provider]  TRADJENTA 5 MG TABS tablet TAKE 1 TABLET EVERY DAY Patient taking differently: Take 5 mg by mouth daily. 05/01/22  Yes Brita Romp, NP  DROPLET PEN NEEDLES 31G X 5 MM MISC USE TO INJECT INSULIN DAILY AS DIRECTED 03/17/22   Nida,  Marella Chimes, MD  ONETOUCH ULTRA test strip USE TO CHECK BLOOD SUGAR TWICE DAILY 03/21/22   Brita Romp, NP  Vitamin D, Ergocalciferol, (DRISDOL) 1.25 MG (50000 UNIT) CAPS capsule Take 50,000 Units by mouth every 7 (seven) days. Monday Patient not taking: Reported on 05/30/2022 05/03/21  [provider]    Physical Exam: Vitals:   05/30/22 1015 05/30/22 1030 05/30/22 1045 05/30/22 1100  BP: 139/82 137/82 137/73 119/80  Pulse: (!) 128 (!) 125 (!) 119 (!) 55  Resp: '17 16 12 17  '$ Temp:      TempSrc:      SpO2: 96% 96% 97% 98%  Weight:      Height:        Constitutional: NAD, calm, comfortable Vitals:   05/30/22 1015 05/30/22 1030 05/30/22 1045 05/30/22 1100  BP: 139/82 137/82 137/73 119/80  Pulse: (!) 128 (!) 125 (!) 119 (!) 55  Resp: '17 16 12 17  '$ Temp:      TempSrc:      SpO2: 96% 96% 97% 98%  Weight:      Height:       Eyes: PERRL, lids and conjunctivae normal ENMT: Mucous membranes are moist. Posterior pharynx clear of any exudate or lesions.Normal dentition.  Neck: normal, supple, no masses, no thyromegaly Respiratory: clear to auscultation bilaterally, no wheezing, no crackles. Normal respiratory effort. No accessory muscle use.  Cardiovascular: Regular rate and rhythm, no murmurs / rubs / gallops. No extremity edema. 2+ pedal pulses. No carotid bruits.  Abdomen: no tenderness, no masses palpated. No hepatosplenomegaly. Bowel sounds positive.  Musculoskeletal: no clubbing / cyanosis. No joint deformity upper and lower extremities. Good ROM, no contractures. Normal muscle tone.  Skin: no rashes, lesions, ulcers. No induration Neurologic: CN 2-12 grossly intact. Sensation intact, DTR normal. Strength 5/5 in all 4.  Psychiatric: Normal judgment and insight. Alert and oriented x 3. Normal mood.    Labs on Admission: I have personally reviewed following labs and imaging studies  CBC: Recent Labs  Lab 05/30/22 0341  WBC 4.4  NEUTROABS 3.3  HGB 8.9*   HCT 26.8*  MCV 94.0  PLT 625*   Basic Metabolic Panel: Recent Labs  Lab 05/30/22 0341  NA 136  K 3.6  CL 96*  CO2 26  GLUCOSE 128*  BUN 44*  CREATININE 17.29*  CALCIUM 9.9   GFR: Estimated Creatinine Clearance: 4.2 mL/min (A) (by C-G formula based on SCr of 17.29 mg/dL (H)). Liver Function Tests: No results for input(s): "AST", "ALT", "ALKPHOS", "BILITOT", "PROT", "ALBUMIN" in the last 168 hours. No results for input(s): "LIPASE", "AMYLASE" in the last 168 hours. No results for input(s): "AMMONIA" in the last 168 hours. Coagulation Profile: No results for input(s): "INR", "PROTIME" in the last 168 hours. Cardiac Enzymes: No results for input(s): "CKTOTAL", "CKMB", "CKMBINDEX", "TROPONINI" in the last 168 hours. BNP (last 3 results) No results for input(s): "PROBNP" in the last 8760 hours. HbA1C: No results for input(s): "HGBA1C" in the last 72 hours. CBG: No results for input(s): "GLUCAP" in the last 168 hours. Lipid Profile: No results for input(s): "CHOL", "HDL", "LDLCALC", "TRIG", "CHOLHDL", "LDLDIRECT" in the last 72 hours. Thyroid Function Tests: No results for input(s): "TSH", "T4TOTAL", "FREET4", "T3FREE", "THYROIDAB" in the last 72 hours. Anemia Panel: No results for input(s): "VITAMINB12", "FOLATE", "FERRITIN", "TIBC", "IRON", "RETICCTPCT" in the last 72 hours. Urine analysis:    Component Value Date/Time   COLORURINE YELLOW 01/15/2014 2320   APPEARANCEUR CLEAR 01/15/2014 2320   LABSPEC 1.010 01/15/2014 2320   PHURINE 5.5 01/15/2014 2320   GLUCOSEU NEGATIVE 01/15/2014 2320   HGBUR TRACE (A) 01/15/2014 2320   BILIRUBINUR NEGATIVE 01/15/2014 2320   KETONESUR NEGATIVE 01/15/2014 2320   PROTEINUR 30 (A) 01/15/2014 2320   UROBILINOGEN 0.2 01/15/2014 2320   NITRITE NEGATIVE 01/15/2014  [place] 01/15/2014 2320    Radiological Exams on Admission: DG Chest 2 View  Result Date: 05/30/2022 CLINICAL DATA:  Chest pain EXAM: CHEST - 2 VIEW  COMPARISON:  01/18/2022 FINDINGS: The heart size and mediastinal contours are within normal limits. Atheromatous calcification both lungs are clear. The visualized skeletal structures are unremarkable. IMPRESSION: No active cardiopulmonary disease. Electronically Signed   By: Jorje Guild M.D.   On: 05/30/2022 04:18    EKG: Independently reviewed.  Sinus, chronic LBBB  Assessment/Plan Principal Problem:   NSTEMI (non-ST elevated myocardial infarction) ([place])  (please populate well all problems here in Problem List. (For example, if patient is on BP meds at home and you resume or decide to hold them, it is a problem that needs to be her. Same for CAD, COPD, HLD and so on)  NSTEMI with known CAD and chronic RCA in-stent stenosis -On heparin drip -On nitro paste, will hold his Imdur for today -Continue aspirin, Brilinta and statin -Echocardiogram -For breakthrough pains, will add PRN Morphine -Cardiology on board  HTN, uncontrolled -Continue Nitropaste -On torsemide -As needed hydralazine, hold off Imdur for today as patient also on Nitropaste  ESRD on PD -Neprho consulted and will set up PD for tonight  IDDM -Cut Lantus to 15 units daily, add sliding scale.  COPD -No acute concern, continue as needed albuterol  DVT prophylaxis: Heparin drip Code Status: Full code Family Communication: None at bedside Disposition Plan: Expect more than 2 midnight hospice, patient is sick with NSTEMI requiring IV heparin x48-72 hours and inpatient cardiology work-up. Consults called: Cardiology, nephrology Admission status: PCU   Lequita Halt MD [facility] Pager [phone]  05/30/2022, 11:12 AM

## 2022-05-30 NOTE — Progress Notes (Signed)
ANTICOAGULATION CONSULT NOTE - Initial Consult  Pharmacy Consult for heparin Indication: chest pain/ACS  Allergies  Allergen Reactions   Opana [Oxymorphone Hcl] Itching   Tramadol Itching    Patient Measurements: Height: '5\' 4"'$  (162.6 cm) Weight: 98.4 kg (217 lb) IBW/kg (Calculated) : 59.2 Heparin Dosing Weight: 81.3kg  Vital Signs: Temp: 97.6 F (36.4 C) (11/14 0634) Temp Source: Oral (11/14 0634) BP: 162/74 (11/14 0634) Pulse Rate: 55 (11/14 0634)  Labs: Recent Labs    05/30/22 0341  HGB 8.9*  HCT 26.8*  PLT 126*  CREATININE 17.29*  TROPONINIHS 100*    Estimated Creatinine Clearance: 4.2 mL/min (A) (by C-G formula based on SCr of 17.29 mg/dL (H)).   Medical History: Past Medical History:  Diagnosis Date   Anemia    Arthritis    Asthma    Bell palsy    CAD (coronary artery disease)    a. 2014 MV: abnl w/ infap ischemia; b. 03/2013 Cath: aneurysmal bleb in the LAD w/ otw nonobs dzs-->Med Rx.   Chronic back pain    Chronic knee pain    a. 09/2015 s/p R TKA.   Chronic pain    Chronic shoulder pain    Chronic sinusitis    COPD (chronic obstructive pulmonary disease) ([facility])    Diabetes mellitus without complication ([place])    type II    ESRD on peritoneal dialysis ([place])    on peritoneal dialysis, [facility]   Essential hypertension    GERD (gastroesophageal reflux disease)    Gout    Gout    Hepatomegaly    noted on noncontrast CT 2015   History of hiatal hernia    Hyperlipidemia    Lateral meniscus tear    Obesity    Truncal   Obstructive sleep apnea    does not use cpap    On home oxygen therapy    uses 2l when is going somewhere per patient    PUD (peptic ulcer disease)    remote, reports f/u EGD about 8 years ago unremarkable    Reactive airway disease    related to exposure to chemical during 9/11   Sinusitis    Vitamin D deficiency     Medications:  Infusions:   heparin      Assessment: 20 yom presented to the ED with CP. To  start IV heparin. Baseline Hgb is low at 8.9 and platelets are low at 126. He is not on anticoagulation PTA.   Goal of Therapy:  Heparin level 0.3-0.7 units/ml Monitor platelets by anticoagulation protocol: Yes   Plan:  Heparin bolus 4000 units IV x 1 Heparin gtt 1100 units/hr Check an 8 hr heparin level Daily heparin level and CBC Watch platelets closely  Bethzy Hauck, Rande Lawman 05/30/2022,8:23 AM

## 2022-05-30 NOTE — ED Provider Triage Note (Signed)
Emergency Medicine Provider Triage Evaluation Note  [name] [name] , a 71 y.o. male  was evaluated in triage.  Pt complains of chest pain onset at 2200. Took 3 SL NTG PTA and reports this helped his pain. Also took 8 tablets of '81mg'$  ASA. No associated lightheadedness, diaphoresis, vomiting. Patient states he is SOB, but this is chronic for him since January and is not new/worse since onset of his chest pain tonight.  Hx of NSTEMI, CAD s/p PCI, peritoneal dialysis.  Review of Systems  Positive: As above Negative: As above  Physical Exam  There were no vitals taken for this visit. Gen:   Awake, no distress   Resp:  Normal effort  MSK:   Moves extremities without difficulty  Other:  No BLE edema.  Medical Decision Making  Medically screening exam initiated at 3:38 AM.  Appropriate orders placed.  [name] [name] was informed that the remainder of the evaluation will be completed by another provider, this initial triage assessment does not replace that evaluation, and the importance of remaining in the ED until their evaluation is complete.  Undifferentiated CP - work up initiated.   [name] Breach, PA-C 05/30/22 0345

## 2022-05-30 NOTE — ED Provider Notes (Signed)
[facility] EMERGENCY DEPARTMENT Provider Note   CSN: [id number] Arrival date & time: 05/30/22  6803     History  Chief Complaint  Patient presents with   Chest Pain    [name] [name] is a 71 y.o. male.   Chest Pain Patient presents with chest pain.  Started yesterday at around 10 PM.  Took 3 nitroglycerin that helped somewhat but then returned.  Still having some pain.  Took aspirin.  States it feels like previous heart attack.  Is a peritoneal dialysis patient.  States he has had no issues with his dialysis recently.  No fevers chills.  No coughing.  Pain is dull anterior chest.    Past Medical History:  Diagnosis Date   Anemia    Arthritis    Asthma    Bell palsy    CAD (coronary artery disease)    a. 2014 MV: abnl w/ infap ischemia; b. 03/2013 Cath: aneurysmal bleb in the LAD w/ otw nonobs dzs-->Med Rx.   Chronic back pain    Chronic knee pain    a. 09/2015 s/p R TKA.   Chronic pain    Chronic shoulder pain    Chronic sinusitis    COPD (chronic obstructive pulmonary disease) ([place])    Diabetes mellitus without complication ([place])    type II    ESRD on peritoneal dialysis ([place])    on peritoneal dialysis, [facility]   Essential hypertension    GERD (gastroesophageal reflux disease)    Gout    Gout    Hepatomegaly    noted on noncontrast CT 2015   History of hiatal hernia    Hyperlipidemia    Lateral meniscus tear    Obesity    Truncal   Obstructive sleep apnea    does not use cpap    On home oxygen therapy    uses 2l when is going somewhere per patient    PUD (peptic ulcer disease)    remote, reports f/u EGD about 8 years ago unremarkable    Reactive airway disease    related to exposure to chemical during 9/11   Sinusitis    Vitamin D deficiency     Home Medications Prior to Admission medications   Medication Sig Start Date End Date Taking? Authorizing Provider  albuterol (VENTOLIN HFA) 108 (90 Base) MCG/ACT inhaler Inhale 2  puffs into the lungs every 6 (six) hours as needed for wheezing or shortness of breath. 03/25/21  Yes Rigoberto Noel, MD  aspirin EC 81 MG tablet Take 81 mg by mouth daily. Swallow whole.   Yes [provider]  calcitRIOL (ROCALTROL) 0.25 MCG capsule Take 0.25 mcg by mouth daily.   Yes [provider]  insulin glargine (LANTUS SOLOSTAR) 100 UNIT/ML Solostar Pen Inject 22 Units into the skin at bedtime. 04/12/22  Yes Brita Romp, NP  isosorbide mononitrate (IMDUR) 60 MG 24 hr tablet TAKE 1 TABLET(60 MG) BY MOUTH DAILY Patient taking differently: Take 60 mg by mouth daily. 05/05/22  Yes Troy Sine, MD  nitroGLYCERIN (NITROSTAT) 0.4 MG SL tablet Place 1 tablet (0.4 mg total) under the tongue every 5 (five) minutes as needed for chest pain. 12/14/21  Yes Maudie Flakes, MD  pantoprazole (PROTONIX) 40 MG tablet Take 1 tablet (40 mg total) by mouth 2 (two) times daily before a meal. 07/25/21  Yes Erenest Rasher, PA-C  sevelamer (RENAGEL) 800 MG tablet Take 2,400 mg by mouth 3 (three) times daily with  meals. Take 1 tablet with snacks   Yes [provider]  simvastatin (ZOCOR) 20 MG tablet Take 20 mg by mouth every morning.   Yes [provider]  ticagrelor (BRILINTA) 90 MG TABS tablet TAKE 1 TABLET(90 MG) BY MOUTH TWICE DAILY Patient taking differently: Take 90 mg by mouth 2 (two) times daily. 04/10/22  Yes Hilty, Nadean Corwin, MD  torsemide (DEMADEX) 100 MG tablet Take 100 mg by mouth daily. 05/03/21  Yes [provider]  TRADJENTA 5 MG TABS tablet TAKE 1 TABLET EVERY DAY Patient taking differently: Take 5 mg by mouth daily. 05/01/22  Yes Brita Romp, NP  DROPLET PEN NEEDLES 31G X 5 MM MISC USE TO INJECT INSULIN DAILY AS DIRECTED 03/17/22   Nida, Marella Chimes, MD  ONETOUCH ULTRA test strip USE TO CHECK BLOOD SUGAR TWICE DAILY 03/21/22   Brita Romp, NP  Vitamin D, Ergocalciferol, (DRISDOL) 1.25 MG (50000 UNIT) CAPS capsule Take 50,000 Units  by mouth every 7 (seven) days. Monday Patient not taking: Reported on 05/30/2022 05/03/21   [provider]      Allergies    Opana [oxymorphone hcl] and Tramadol    Review of Systems   Review of Systems  Cardiovascular:  Positive for chest pain.    Physical Exam Updated Vital Signs BP 126/81   Pulse (!) 50   Temp 97.6 F (36.4 C) (Oral)   Resp 18   Ht '5\' 4"'$  (1.626 m)   Wt 98.4 kg   SpO2 97%   BMI 37.25 kg/m  Physical Exam Vitals and nursing note reviewed.  Cardiovascular:     Rate and Rhythm: Normal rate and regular rhythm.  Pulmonary:     Breath sounds: No wheezing or rhonchi.  Chest:     Chest wall: No tenderness.  Abdominal:     Tenderness: There is no abdominal tenderness.  Musculoskeletal:     Right lower leg: No edema.     Left lower leg: No edema.  Skin:    Capillary Refill: Capillary refill takes less than 2 seconds.  Neurological:     Mental Status: He is alert.     ED Results / Procedures / Treatments   Labs (all labs ordered are listed, but only abnormal results are displayed) Labs Reviewed  BASIC METABOLIC PANEL - Abnormal; Notable for the following components:      Result Value   Chloride 96 (*)    Glucose, Bld 128 (*)    BUN 44 (*)    Creatinine, Ser 17.29 (*)    GFR, Estimated 3 (*)    All other components within normal limits  CBC WITH DIFFERENTIAL/PLATELET - Abnormal; Notable for the following components:   RBC 2.85 (*)    Hemoglobin 8.9 (*)    HCT 26.8 (*)    Platelets 126 (*)    Lymphs Abs 0.5 (*)    All other components within normal limits  TROPONIN I (HIGH SENSITIVITY) - Abnormal; Notable for the following components:   Troponin I (High Sensitivity) 100 (*)    All other components within normal limits  TROPONIN I (HIGH SENSITIVITY) - Abnormal; Notable for the following components:   Troponin I (High Sensitivity) 142 (*)    All other components within normal limits  HEPARIN LEVEL (UNFRACTIONATED)    EKG EKG  Interpretation  Date/Time:  Tuesday May 30 2022 03:28:38 EST Ventricular Rate:  48 PR Interval:  218 QRS Duration: 120 QT Interval:  452 QTC Calculation: 403 R Axis:   -  50 Text Interpretation: with 2nd degree A-V block ([place] I) Septal infarct , age undetermined T wave abnormality, consider lateral ischemia Abnormal ECG When compared with ECG of 21-Jan-2022 04:18, PREVIOUS ECG IS PRESENT Confirmed by Davonna Belling [phone]) on 05/30/2022 8:08:01 AM  Radiology DG Chest 2 View  Result Date: 05/30/2022 CLINICAL DATA:  Chest pain EXAM: CHEST - 2 VIEW COMPARISON:  01/18/2022 FINDINGS: The heart size and mediastinal contours are within normal limits. Atheromatous calcification both lungs are clear. The visualized skeletal structures are unremarkable. IMPRESSION: No active cardiopulmonary disease. Electronically Signed   By: Jorje Guild M.D.   On: 05/30/2022 04:18    Procedures Procedures    Medications Ordered in ED Medications  heparin ADULT infusion 100 units/mL (25000 units/255m) (1,100 Units/hr Intravenous New Bag/Given 05/30/22 0857)  nitroGLYCERIN (NITROGLYN) 2 % ointment 0.5 inch (0.5 inches Topical Given 05/30/22 0645)  heparin bolus via infusion 4,000 Units (4,000 Units Intravenous Bolus from Bag 05/30/22 0858)    ED Course/ Medical Decision Making/ A&P Clinical Course as of 05/30/22 09470 Tue May 30, 2022  0630 Patient c/o persistent pain. Will order NTG paste [KH]    Clinical Course User Index [KH] HAntonietta Breach PA-C                           Medical Decision Making Amount and/or Complexity of Data Reviewed Labs: ordered.  Risk Prescription drug management.   Patient anterior chest pain.  Began yesterday.  Previous coronary artery disease.  Differential diagnoses long but does include coronary artery disease and other serious causes of pain.  Chest x-ray independently interpreted and reassuring.  Shows no pneumonia or other clear cause.  EKG is  reassuring.  However does have elevated troponins.  Has had looks like somewhat chronically elevated troponins but usually not at this level.  We will check a delta troponin but with pain feeling like previous MI we will start heparin drip.  Already has nitro placed on and still has some mild pain.  Will repeat EKG and discussed with cardiology.  Repeat EKG stable to prior.  Second troponin just slightly increased from before from 100 up to 140.  Discussed with cardiology who will see patient.  Patient is on heparin drip.  However will admit to internal medicine.  CRITICAL CARE Performed by: NDavonna BellingTotal critical care time: 30 minutes Critical care time was exclusive of separately billable procedures and treating other patients. Critical care was necessary to treat or prevent imminent or life-threatening deterioration. Critical care was time spent personally by me on the following activities: development of treatment plan with patient and/or surrogate as well as nursing, discussions with consultants, evaluation of patient's response to treatment, examination of patient, obtaining history from patient or surrogate, ordering and performing treatments and interventions, ordering and review of laboratory studies, ordering and review of radiographic studies, pulse oximetry and re-evaluation of patient's condition.         Final Clinical Impression(s) / ED Diagnoses Final diagnoses:  End stage renal disease on dialysis ([facility]  NSTEMI (non-ST elevated myocardial infarction) ([facility]    Rx / DWainihaOrders ED Discharge Orders     None         PDavonna Belling MD 05/30/22 [id number]

## 2022-05-30 NOTE — ED Triage Notes (Signed)
Pt to ED via [facility] from home.  Pt c/o chest pain, states it feels same as when he previously had a NSTEMI.  Pt has dialysis fistula in left arm, currently does peritoneal dialysis. Pt has had ntg x 3, ASA '81mg'$  x 8.   130/80 HR 43-63 18g RAC

## 2022-05-30 NOTE — Progress Notes (Signed)
Site area: right groin  Site Prior to Removal:  Level 0  Pressure Applied For 22 MINUTES    Minutes Beginning at 1638  Manual:   Yes.    Patient Status During Pull:  Stable  Post Pull Groin Site:  Level 0  Post Pull Instructions Given:  Yes.    Post Pull Pulses Present:  Yes.    Dressing Applied:  Yes.    Comments:  Bed rest started at 1700 X 4 hr.

## 2022-05-30 NOTE — Consult Note (Addendum)
Cardiology Consultation   Patient ID: [name] [name] MRN: [id number]; DOB: [birth year]  Admit date: 05/30/2022 Date of Consult: 05/30/2022  PCP:  [name] [name], [facility] Cardiologist:  [name] Casino, MD        Patient Profile:   [name] [name] is a 71 y.o. male with a hx of IDDM, HLD, HTN,, OSA on CPAP, ESRD on peritoneal dialysis, CAD with DES to RCA 07/2021-and  DES X 2 12/2021 early restenosis 01/2022 with DES placed RCA who is being seen 05/30/2022 for the evaluation of chest pain at the request of [name] [name].  History of Present Illness:   [name] [name] with hx as above including NSTEMI 07/27/21 with DES to RCA and PTCA alone X 2,  EF 55-60% severe LVH, G1DD.  12/27/21 admitted with unstable angina and cath with single vessel disease with sequential 90-95% mid RCA stenosis at sites of angioplasty in 07/2021 with overlapping stents.  Echo with EF 60-65%,  mild LVH, mild to mod AS.  01/21/22 admitted with chest pain and cath with severe early in stent restenosis in mRCA, with DES added.  Limited echo at that time no AS, EF 65-70%.     He did have Mobitz 1 AV block BB stopped.  Wore outpt monitor revealed CHB and pt referred to Dr. Caryl [name] 02/2022. Monitor with bradycardia with second-degree AV block type I first-degree AV block and electrocardiograms after which he was submitted for a event recorder demonstrating second-degree AV block 2: 1 and nocturnal complete heart block --Dr. Caryl [name] noted CHB nocturnal and pt not always compliant with CPAP.  Daytime brady is at rest and his heart rate excursion on his monitor is normal.  At this juncture there is no indication for pacing.[name] [name]  Pt presents with chest pain, similar to other admits with [place], NSTEMI.  He has taken NTG X 3 and ASA  EKG:  The EKG was personally reviewed and demonstrates:  Mobitz 1 block  HR 48 non specific ST abnormalities no acute ST elevation  Telemetry:  Telemetry was personally reviewed  and demonstrates:  Mobitz 1 HR to 43  Sitting on side of bed mild discomfort but if moves around much worse. This is similar to MI in Jan 2023.  Since then he has some mild discomfort but over last few weeks with walking he had chest burning and pain more sharp but it was sharp in Jan 2023.  No nausea SOB or diaphoresis.  The pain over at least one weak has increased in severity.  With rest it resolves,  he tries to work through it but by last pm he could not.  Pain was 10/10, he called EMS and took NTG the NTG relieved the pain but it would return.  He took 8 of 81 mg ASA.  Initially he declined to come to ER but pain returned after EMS left so he called them back and came to the hospital.  His peritoneal dialysis is at night,  he does have AV graft.    He is using his CPAP again, had it fixed.  No lightheadedness, syncope or dizziness.    Na 136, K+ 3.6 BUN 44 Cr 17.29 hs troponin 100 and 142 Hgb 8.9 WBC 4.4 plts 126  2V CXR NAD   BP 141/72 to 119/80 afebrile, HR 46 to 56 R 15-17  Past Medical History:  Diagnosis Date   Anemia    Arthritis    Asthma  Bell palsy    CAD (coronary artery disease)    a. 2014 MV: abnl w/ infap ischemia; b. 03/2013 Cath: aneurysmal bleb in the LAD w/ otw nonobs dzs-->Med Rx.   Chronic back pain    Chronic knee pain    a. 09/2015 s/p R TKA.   Chronic pain    Chronic shoulder pain    Chronic sinusitis    COPD (chronic obstructive pulmonary disease) ([place])    Diabetes mellitus without complication ([place])    type II    ESRD on peritoneal dialysis ([place])    on peritoneal dialysis, [facility]   Essential hypertension    GERD (gastroesophageal reflux disease)    Gout    Gout    Hepatomegaly    noted on noncontrast CT 2015   History of hiatal hernia    Hyperlipidemia    Lateral meniscus tear    Obesity    [place]   Obstructive sleep apnea    does not use cpap    On home oxygen therapy    uses 2l when is going somewhere per patient    PUD (peptic  ulcer disease)    remote, reports f/u EGD about 8 years ago unremarkable    Reactive airway disease    related to exposure to chemical during 9/11   Sinusitis    Vitamin D deficiency     Past Surgical History:  Procedure Laterality Date   ASAD LT SHOULDER  12/15/2008   left shoulder   AV FISTULA PLACEMENT Left 08/09/2016   Procedure: BRACHIOCEPHALIC ARTERIOVENOUS (AV) FISTULA CREATION LEFT ARM;  Surgeon: [name], MD;  Location: [place];  Service: Vascular;  Laterality: Left;   CAPD INSERTION N/A 10/07/2018   Procedure: LAPAROSCOPIC PERITONEAL CATHETER PLACEMENT;  Surgeon: Mickeal Skinner, MD;  Location[facility];  Service: General;  Laterality: N/A;   CATARACT EXTRACTION W/PHACO Left 03/28/2016   Procedure: CATARACT EXTRACTION PHACO AND INTRAOCULAR LENS PLACEMENT LEFT EYE;  Surgeon: [name], MD;  Location: [facility];  Service: Ophthalmology;  Laterality: Left;  CDE: 4.77   CATARACT EXTRACTION W/PHACO Right 04/11/2016   Procedure: CATARACT EXTRACTION PHACO AND INTRAOCULAR LENS PLACEMENT RIGHT EYE; CDE:  4.74;  Surgeon: [name], MD;  Location: [facility];  Service: Ophthalmology;  Laterality: Right;   COLONOSCOPY  10/15/2008   Fields: Rectal polyp obliterated, not retrieved, hemorrhoids, single ascending colon diverticulum near the CV. Next colonoscopy April 2020   COLONOSCOPY N/A 12/25/2014   SLF: 1. Colorectal polyps (2) removed 2. Small internal hemorrhoids 3. the left colon is severely redundant. hyperplastic polyps   CORONARY STENT INTERVENTION N/A 07/25/2021   Procedure: CORONARY STENT INTERVENTION;  Surgeon: Sherren Mocha, MD;  Location: [place] [facility];  Service: Cardiovascular;  Laterality: N/A;   CORONARY STENT INTERVENTION N/A 12/26/2021   Procedure: CORONARY STENT INTERVENTION;  Surgeon: Nelva Bush, MD;  Location: [place] [facility];  Service: Cardiovascular;  Laterality: N/A;   CORONARY STENT INTERVENTION N/A 01/20/2022   Procedure: CORONARY STENT  INTERVENTION;  Surgeon: Sherren Mocha, MD;  Location: [place] [facility];  Service: Cardiovascular;  Laterality: N/A;   DOPPLER ECHOCARDIOGRAPHY     ESOPHAGOGASTRODUODENOSCOPY N/A 12/25/2014   SLF: 1. Anemia most likely due to CRI, gastritis, gastric polyps 2. Moderate non-erosive gastriits and mild duodenitis.  3.TWo large gstric polyps removed.    EYE SURGERY  12/22/2010   tear duct probing-[place]   FOREIGN BODY REMOVAL  03/29/2011   Procedure: REMOVAL FOREIGN BODY EXTREMITY;  Surgeon: Dorothyann Peng  Aline Brochure, MD;  Location: [facility];  Service: Orthopedics;  Laterality: Right;  Removal Foreign Body Right Thumb   IR FLUORO GUIDE CV LINE RIGHT  08/06/2018   IR US GUIDE VASC ACCESS RIGHT  08/06/2018   KNEE ARTHROSCOPY  10/16/2007   left   KNEE ARTHROSCOPY WITH LATERAL MENISECTOMY Right 10/14/2015   Procedure: LEFT KNEE ARTHROSCOPY WITH PARTIAL LATERAL MENISECTOMY;  Surgeon: Carole Civil, MD;  Location: [facility];  Service: Orthopedics;  Laterality: Right;   LEFT HEART CATH AND CORONARY ANGIOGRAPHY N/A 07/25/2021   Procedure: LEFT HEART CATH AND CORONARY ANGIOGRAPHY;  Surgeon: Sherren Mocha, MD;  Location: [place] [facility];  Service: Cardiovascular;  Laterality: N/A;   LEFT HEART CATH AND CORONARY ANGIOGRAPHY N/A 12/26/2021   Procedure: LEFT HEART CATH AND CORONARY ANGIOGRAPHY;  Surgeon: Nelva Bush, MD;  Location: [place] [facility];  Service: Cardiovascular;  Laterality: N/A;   LEFT HEART CATH AND CORONARY ANGIOGRAPHY N/A 01/20/2022   Procedure: LEFT HEART CATH AND CORONARY ANGIOGRAPHY;  Surgeon: Sherren Mocha, MD;  Location: [place] [facility];  Service: Cardiovascular;  Laterality: N/A;   LEFT HEART CATHETERIZATION WITH CORONARY ANGIOGRAM N/A 03/28/2013   Procedure: LEFT HEART CATHETERIZATION WITH CORONARY ANGIOGRAM;  Surgeon: Leonie Man, MD;  Location: [facility];  Service: Cardiovascular;  Laterality: N/A;   NM MYOVIEW LTD     PENILE PROSTHESIS IMPLANT N/A 08/16/2015    Procedure: PENILE PROTHESIS INFLATABLE, three piece, Excisional biopsy of Penile ulcer, Penile molding;  Surgeon: Carolan Clines, MD;  [facility];  Service: Urology;  Laterality: N/A;   PENILE PROSTHESIS IMPLANT N/A 12/24/2017   Procedure: REMOVAL AND  REPLACEMENT  COLOPLAST PENILE PROSTHESIS;  Surgeon: Lucas Mallow, MD;  [facility];  Service: Urology;  Laterality: N/A;   QUADRICEPS TENDON REPAIR  07/21/2011   Procedure: REPAIR QUADRICEP TENDON;  Surgeon: Arther Abbott, MD;  [facility];  Service: Orthopedics;  Laterality: Right;   TOENAIL EXCISION     removed [id number]   UMBILICAL HERNIA REPAIR  07/17/2005   [place]     Home Medications:  Prior to Admission medications   Medication Sig Start Date End Date Taking? Authorizing Provider  albuterol (VENTOLIN HFA) 108 (90 Base) MCG/ACT inhaler Inhale 2 puffs into the lungs every 6 (six) hours as needed for wheezing or shortness of breath. 03/25/21  Yes Rigoberto Noel, MD  aspirin EC 81 MG tablet Take 81 mg by mouth daily. Swallow whole.   Yes [provider]  calcitRIOL (ROCALTROL) 0.25 MCG capsule Take 0.25 mcg by mouth daily.   Yes [provider]  insulin glargine (LANTUS SOLOSTAR) 100 UNIT/ML Solostar Pen Inject 22 Units into the skin at bedtime. 04/12/22  Yes Brita Romp, NP  isosorbide mononitrate (IMDUR) 60 MG 24 hr tablet TAKE 1 TABLET(60 MG) BY MOUTH DAILY Patient taking differently: Take 60 mg by mouth daily. 05/05/22  Yes Troy Sine, MD  nitroGLYCERIN (NITROSTAT) 0.4 MG SL tablet Place 1 tablet (0.4 mg total) under the tongue every 5 (five) minutes as needed for chest pain. 12/14/21  Yes Maudie Flakes, MD  pantoprazole (PROTONIX) 40 MG tablet Take 1 tablet (40 mg total) by mouth 2 (two) times daily before a meal. 07/25/21  Yes Erenest Rasher, PA-C  sevelamer (RENAGEL) 800 MG tablet Take 2,400 mg by mouth 3 (three) times daily with meals. Take 1 tablet with snacks   Yes  [provider]  simvastatin (ZOCOR) 20 MG tablet Take  20 mg by mouth every morning.   Yes [provider]  ticagrelor (BRILINTA) 90 MG TABS tablet TAKE 1 TABLET(90 MG) BY MOUTH TWICE DAILY Patient taking differently: Take 90 mg by mouth 2 (two) times daily. 04/10/22  Yes Hilty, Nadean Corwin, MD  torsemide (DEMADEX) 100 MG tablet Take 100 mg by mouth daily. 05/03/21  Yes [provider]  TRADJENTA 5 MG TABS tablet TAKE 1 TABLET EVERY DAY Patient taking differently: Take 5 mg by mouth daily. 05/01/22  Yes Brita Romp, NP  DROPLET PEN NEEDLES 31G X 5 MM MISC USE TO INJECT INSULIN DAILY AS DIRECTED 03/17/22   Nida, Marella Chimes, MD  ONETOUCH ULTRA test strip USE TO CHECK BLOOD SUGAR TWICE DAILY 03/21/22   Brita Romp, NP  Vitamin D, Ergocalciferol, (DRISDOL) 1.25 MG (50000 UNIT) CAPS capsule Take 50,000 Units by mouth every 7 (seven) days. Monday Patient not taking: Reported on 05/30/2022 05/03/21   [provider]    Inpatient Medications: Scheduled Meds:  [START ON 05/31/2022] aspirin EC  81 mg Oral Daily   calcitRIOL  0.25 mcg Oral Daily   insulin aspart  0-6 Units Subcutaneous TID WC   insulin glargine-yfgn  15 Units Subcutaneous Daily   linagliptin  5 mg Oral Daily   pantoprazole  40 mg Oral BID AC   sevelamer carbonate  1,600 mg Oral TID WC   simvastatin  20 mg Oral q morning   ticagrelor  90 mg Oral BID   torsemide  100 mg Oral Daily   Continuous Infusions:  heparin 1,100 Units/hr (05/30/22 0857)   PRN Meds:   Allergies:    Allergies  Allergen Reactions   Opana [Oxymorphone Hcl] Itching   Tramadol Itching    Social History:   Social History   Socioeconomic History   Marital status: Married    Spouse name: Not on file   Number of children: 2   Years of education: 12th grade   Highest education level: Not on file  Occupational History   Occupation: disabled   Occupation:      Fish farm manager: UNEMPLOYED  Tobacco Use    Smoking status: Former    Packs/day: 1.00    Years: 25.00    Total pack years: 25.00    Types: Cigarettes    Quit date: 03/27/2010    Years since quitting: 12.1   Smokeless tobacco: Never   Tobacco comments:    Quit x 7 years  Vaping Use   Vaping Use: Never used  Substance and Sexual Activity   Alcohol use: Not Currently    Comment: occasionally   Drug use: Yes    Types: Marijuana    Comment: cocaine- last time used- 11/24/2017 , marijuana-    Sexual activity: Yes  Other Topics Concern   Not on file  Social History Narrative   He quit smoking in 2010. 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 He developed pulmonary problems, became disabled because of lower airway disease in 2009.       WATCHES BASKETBALL. HIS TEAM IS [employer].   Social Determinants of Health   Financial Resource Strain: Low Risk  (12/29/2021)   Overall Financial Resource Strain (CARDIA)    Difficulty of Paying Living Expenses: Not hard at all  Food Insecurity: No Food Insecurity (02/01/2022)   Hunger Vital Sign    Worried About Running Out of Food in the Last Year: Never true    Ran Out of Food  in the Last Year: Never true  Transportation Needs: No Transportation Needs (02/17/2022)   PRAPARE - Hydrologist (Medical): No    Lack of Transportation (Non-Medical): No  Physical Activity: Sufficiently Active (12/29/2021)   Exercise Vital Sign    Days of Exercise per Week: 3 days    Minutes of Exercise per Session: 60 min  Stress: No Stress Concern Present (12/29/2021)   [place]    Feeling of Stress : Not at all  Social Connections: [place] (12/29/2021)   Social Connection and Isolation Panel [NHANES]    Frequency of Communication with Friends and Family: More than three times a week    Frequency of Social Gatherings with Friends and Family: More than three times a week     Attends Religious Services: More than 4 times per year    Active Member of [employer] or Organizations: Yes    Attends Music therapist: More than 4 times per year    Marital Status: Married  Human resources officer Violence: Not At Risk (12/29/2021)   Humiliation, Afraid, Rape, and Kick questionnaire    Fear of Current or Ex-Partner: No    Emotionally Abused: No    Physically Abused: No    Sexually Abused: No    Family History:    Family History  Problem Relation Age of Onset   Hypertension Mother        MI   Cancer Mother        breast    Diabetes Mother    Diabetes Father    Hypertension Father    Hypertension Sister    Diabetes Sister    Arthritis Other    Asthma Other    Lung disease Other    Anesthesia problems Neg Hx    Hypotension Neg Hx    Malignant hyperthermia Neg Hx    Pseudochol deficiency Neg Hx    Colon cancer Neg Hx      ROS:  Please see the history of present illness.  General:no colds or fevers, no weight changes Skin:no rashes or ulcers HEENT:no blurred vision, no congestion CV:see HPI PUL:see HPI GI:no diarrhea constipation or melena, no indigestion GU:no hematuria, no dysuria MS:no joint pain, no claudication Neuro:no syncope, no lightheadedness Endo:no diabetes, no thyroid disease  All other ROS reviewed and negative.     Physical Exam/Data:   Vitals:   05/30/22 1015 05/30/22 1030 05/30/22 1045 05/30/22 1100  BP: 139/82 137/82 137/73 119/80  Pulse: (!) 128 (!) 125 (!) 119 (!) 55  Resp: _0 Temp:      TempSrc:      SpO2: 96% 96% 97% 98%  Weight:      Height:       No intake or output data in the 24 hours ending 05/30/22 1144    05/30/2022    3:46 AM 05/03/2022   10:21 AM 05/02/2022   11:47 AM  Last 3 Weights  Weight (lbs) 217 lb 220 lb 9.6 oz 217 lb 6.4 oz  Weight (kg) 98.431 kg 100.064 kg 98.612 kg     Body mass index is 37.25 kg/m.  General:  Well nourished, well developed, in no acute distress HEENT:  normal Neck: no JVD Vascular: No carotid bruits; Distal pulses 2+ bilaterally Cardiac:  normal S1, S2; RRR; no murmur gallup rub or click Lungs:  clear to auscultation bilaterally, no wheezing, rhonchi or rales  Abd: soft, nontender,  no hepatomegaly  Ext: no edema Musculoskeletal:  No deformities, BUE and BLE strength normal and equal Skin: warm and dry  Neuro:  alert and oriented X 3 MAE follows commands, no focal abnormalities noted Psych:  Normal affect     Relevant CV Studies: Most recent cath 01/20/2022  Diagnostic Dominance: Right  Intervention     1.  Stable nonobstructive plaquing in the left main, LAD and left circumflex vessels 2.  Severe early in-stent restenosis in the mid RCA, treated successfully with a 4.0 x 16 mm Synergy DES postdilated to high-pressure with a 4.5 mm noncompliant balloon. 3.  Normal LVEDP  Recommendations: Continue dual antiplatelet therapy with aspirin and ticagrelor at least 12 months, consider indefinite antiplatelet therapy in this patient with end-stage renal disease and multiple PCI procedures.  From a cardiac perspective, the patient should be eligible for hospital discharge tomorrow   Echo 01/19/22     ECHOCARDIOGRAM LIMITED REPORT       Patient Name:   VERE DIANTONIO Date of Exam: 01/19/2022 Medical Rec #:  [id number]      Height:       64.0 in Accession #:    [id number]     Weight:       223.8 lb Date of Birth:  1950-08-13     BSA:          2.052 m Patient Age:    15 years       BP:           127/64 mmHg Patient Gender: M              HR:           63 bpm. Exam Location:  Inpatient  Procedure: Limited Echo, Limited Color Doppler and Cardiac Doppler  Indications:    Chest Pain R07.9   History:        Patient has prior history of Echocardiogram examinations, most                 recent 12/27/2021. CAD, COPD; Risk Factors:Hypertension and                 Dyslipidemia. End stage renal disease                 1st degree HB.    Sonographer:    Ronny Flurry Sonographer#2:  Darlina Sicilian RDCS Referring Phys: [id number] [facility] A CHANDRASEKHAR  IMPRESSIONS    1. Left ventricular ejection fraction, by estimation, is 65 to 70%. The left ventricle has normal function.  2. The mitral valve is grossly normal. No evidence of mitral valve regurgitation.  3. The aortic valve is calcified. There is mild calcification of the aortic valve. There is mild thickening of the aortic valve. Aortic valve regurgitation is not visualized. Aortic valve sclerosis is present, with no evidence of aortic valve stenosis. Aortic valve mean gradient measures 9.6 mmHg. Aortic valve Vmax measures 2.35 m/s.  FINDINGS  Left Ventricle: Left ventricular ejection fraction, by estimation, is 65 to 70%. The left ventricle has normal function.  Mitral Valve: The mitral valve is grossly normal. Mild mitral annular calcification.  Tricuspid Valve: The tricuspid valve is grossly normal. Tricuspid valve regurgitation is trivial.  Aortic Valve: The aortic valve is calcified. There is mild calcification of the aortic valve. There is mild thickening of the aortic valve. Aortic valve regurgitation is not visualized. Aortic valve sclerosis is present, with no evidence of aortic valve stenosis. Aortic valve mean gradient  measures 9.6 mmHg. Aortic valve peak gradient measures 22.1 mmHg. Aortic valve area, by VTI measures 1.17 cm.  Aorta: The aortic root was not well visualized.    monitor 12/2021  Patient had a min HR of 31 bpm, max HR of 125 bpm, and avg HR of 62 bpm. Predominant underlying rhythm was Sinus Rhythm. First Degree AV Block was present. Bundle Branch Block/IVCD was present. QRS morphology changes were present throughout recording. 4  episode(s) of AV Block (3rd) occurred, lasting a total of 49 secs. Second Degree AV Block-Mobitz I (Wenckebach) was present. Isolated SVEs were rare (<1.0%), and no SVE Couplets or SVE Triplets were  present. Isolated VEs were occasional (3.1%, [id number]),  VE Couplets were rare (<1.0%, 1), and VE Triplets were rare (<1.0%, 1). MD notification criteria for Complete Heart Block met - report posted prior to notification per account request (MA).   Laboratory Data:  High Sensitivity Troponin:   Recent Labs  Lab 05/30/22 0341 05/30/22 0640  TROPONINIHS 100* 142*     Chemistry Recent Labs  Lab 05/30/22 0341  NA 136  K 3.6  CL 96*  CO2 26  GLUCOSE 128*  BUN 44*  CREATININE 17.29*  CALCIUM 9.9  GFRNONAA 3*  ANIONGAP 14    No results for input(s): "PROT", "ALBUMIN", "AST", "ALT", "ALKPHOS", "BILITOT" in the last 168 hours. Lipids No results for input(s): "CHOL", "TRIG", "HDL", "LABVLDL", "LDLCALC", "CHOLHDL" in the last 168 hours.  Hematology Recent Labs  Lab 05/30/22 0341  WBC 4.4  RBC 2.85*  HGB 8.9*  HCT 26.8*  MCV 94.0  MCH 31.2  MCHC 33.2  RDW 15.0  PLT 126*   Thyroid No results for input(s): "TSH", "FREET4" in the last 168 hours.  BNPNo results for input(s): "BNP", "PROBNP" in the last 168 hours.  DDimer No results for input(s): "DDIMER" in the last 168 hours.   Radiology/Studies:  DG Chest 2 View  Result Date: 05/30/2022 CLINICAL DATA:  Chest pain EXAM: CHEST - 2 VIEW COMPARISON:  01/18/2022 FINDINGS: The heart size and mediastinal contours are within normal limits. Atheromatous calcification both lungs are clear. The visualized skeletal structures are unremarkable. IMPRESSION: No active cardiopulmonary disease. Electronically Signed   By: Jorje Guild M.D.   On: 05/30/2022 04:18     Assessment and Plan:   [place]/NSTEMI with increasing chest pain.  At rest is comfortable. Has had ASA and NTG.  His meds did change in October, with stopping Ranexa and decrease of Imdur to 60- he was very weak and tired on both.  His losartan was stopped as well.  I ordered 3rd troponin.  He is on IV heparin.  Possible cath, will defer to Dr. Stanford Breed.  ? Increase imdur  CAD with  significant RCA disease with NSTEMI in 07/2021 with stent to RCA, June -[place] with 2 stents to RCA at prior angioplasty sites, and then early re-stenosis in July with mRCA stent.  He also has non obstructive disease in Lt main and LAD, LCX up to 50%.   Mobitz I and nocturnal CHB, now using his CPAP more.  HR awake on side of bed 40-50s.  No symptoms  (has been evaluated by Dr. Caryl [name] and with exertion HR increased, CHB only nocturnal)  HLD on statin continue OSA with CPAP continue IDDM per IM ESRD on peritoneal dialysis at night -he does have fistula. Per nephrology.  (He continues on Demadex 100 daily)  Risk Assessment/Risk Scores:     TIMI Risk Score for Unstable Angina  or Non-ST Elevation MI:   The patient's TIMI risk score is 6, which indicates a 41% risk of all cause mortality, new or recurrent myocardial infarction or need for urgent revascularization in the next 14 days.  For questions or updates, please contact [place] Please consult [email or web address] for contact info under   Signed, Cecilie Kicks, NP  05/30/2022 11:44 AM As above, patient seen and examined.  Briefly he is a 71 year old male with past medical history of diabetes mellitus, hypertension, hyperlipidemia, obstructive sleep apnea, end-stage renal disease on peritoneal dialysis, coronary artery disease with history of PCI of the right coronary artery for evaluation of unstable angina.  Patient states that since his last PCI in July he has had stable angina described as a burning pain in the substernal area followed by sharp pain in the left chest.  This occurs after walking 1 mile and typically resolves with stopping his activities.  However beginning last night his symptoms occurred with minimal activity and also occurred at rest.  His symptoms are similar to his prior infarct pain.  He is admitted and cardiology now asked to evaluate. Electrocardiogram shows sinus rhythm with Mobitz 1 second-degree AV block, nonspecific  ST changes.  Troponins are 101 142.  Creatinine is 17.29.  Hemoglobin 8.9 with normal MCV.  Chest x-ray with no infiltrates.  1 unstable angina-patient's symptoms are identical to his infarct pain.  They have increased in severity over the past 24 hours.  Plan will be to proceed with cardiac catheterization.  The risk and benefits including myocardial infarction, CVA and death discussed and he agrees to proceed.  Would repeat echocardiogram to reassess LV function.  Continue aspirin, Brilinta, heparin and statin.  2 end-stage renal disease-on peritoneal dialysis.  We will ask nephrology to see.  Note patient is on Demadex which may be able to be discontinued.  3 Mobitz 1 second-degree AV block-noted on ECG and telemetry.  He has been found to have Mobitz 1 second-degree AV block, 2-1 AV block and previously found to have complete heart block at night.  He saw Dr. Caryl [name] and this was felt secondary to not using CPAP.  We will continue to follow but no indication for pacemaker at present.  4 hyperlipidemia-continue statin.  5 obstructive sleep apnea-continue CPAP.  Kirk Ruths

## 2022-05-30 NOTE — Progress Notes (Signed)
[name] [name] for heparin Indication: chest pain/ACS and post-cath with cardiac surgery consult  Allergies  Allergen Reactions   Opana [Oxymorphone Hcl] Itching   Tramadol Itching    Patient Measurements: Height: '5\' 4"'$  (162.6 cm) Weight: 98.4 kg (217 lb) IBW/kg (Calculated) : 59.2 Heparin Dosing Weight: 81.3kg  Vital Signs: Temp: 97.9 F (36.6 C) (11/14 1206) Temp Source: Oral (11/14 1206) BP: 121/68 (11/14 1547) Pulse Rate: 53 (11/14 1547)  Labs: Recent Labs    05/30/22 0341 05/30/22 0640 05/30/22 1148  HGB 8.9*  --   --   HCT 26.8*  --   --   PLT 126*  --   --   CREATININE 17.29*  --   --   TROPONINIHS 100* 142* 330*    Estimated Creatinine Clearance: 4.2 mL/min (A) (by C-G formula based on SCr of 17.29 mg/dL (H)).  Medications:  Infusions:   sodium chloride     sodium chloride     heparin 1,100 Units/hr (05/30/22 0857)    Assessment: 44 yom with significant cardiac history including multiple episodes of restenosis presented to the ED with CP and was initiated on IV Heparin.   Patient went for cardiac catheterization today and found to have changed proximal LAD lesion. Plan for cardiac surgery consult and pharmacy consulted to resume IV Heparin 8 hours post-sheath pull.    Goal of Therapy:  Heparin level 0.3-0.7 units/ml Monitor platelets by anticoagulation protocol: Yes   Plan:  At 23:24 PM, restart Heparin infusion at rate of 1100 units/hr Check an 8 hr heparin level Daily heparin level and CBC Watch platelets closely  Sloan Leiter, PharmD, BCPS, BCCCP Clinical Pharmacist Please refer to [facility] for [place] numbers 05/30/2022,4:44 PM

## 2022-05-30 NOTE — ED Notes (Signed)
Pt transported to cath lab.  

## 2022-05-31 ENCOUNTER — Inpatient Hospital Stay (HOSPITAL_COMMUNITY): Payer: Medicare Other

## 2022-05-31 ENCOUNTER — Encounter (HOSPITAL_COMMUNITY): Payer: Self-pay | Admitting: Interventional Cardiology

## 2022-05-31 DIAGNOSIS — I214 Non-ST elevation (NSTEMI) myocardial infarction: Secondary | ICD-10-CM

## 2022-05-31 DIAGNOSIS — I2510 Atherosclerotic heart disease of native coronary artery without angina pectoris: Secondary | ICD-10-CM | POA: Diagnosis not present

## 2022-05-31 LAB — BASIC METABOLIC PANEL
Anion gap: 11 (ref 5–15)
BUN: 48 mg/dL — ABNORMAL HIGH (ref 8–23)
CO2: 27 mmol/L (ref 22–32)
Calcium: 9.4 mg/dL (ref 8.9–10.3)
Chloride: 98 mmol/L (ref 98–111)
Creatinine, Ser: 19.27 mg/dL — ABNORMAL HIGH (ref 0.61–1.24)
GFR, Estimated: 2 mL/min — ABNORMAL LOW (ref >60)
Glucose, Bld: 101 mg/dL — ABNORMAL HIGH (ref 70–99)
Potassium: 4 mmol/L (ref 3.5–5.1)
Sodium: 136 mmol/L (ref 135–145)

## 2022-05-31 LAB — ECHOCARDIOGRAM COMPLETE
AR max vel: 1.52 cm2
AV Area VTI: 1.54 cm2
AV Area mean vel: 1.51 cm2
AV Mean grad: 9.2 mmHg
AV Peak grad: 18.7 mmHg
Ao pk vel: 2.16 m/s
Height: 64 in
MV M vel: 4.85 m/s
MV Peak grad: 94.1 mmHg
MV VTI: 1.4 cm2
S' Lateral: 3.6 cm
Weight: 3472 oz

## 2022-05-31 LAB — CBC
HCT: 28.8 % — ABNORMAL LOW (ref 39.0–52.0)
Hemoglobin: 9.5 g/dL — ABNORMAL LOW (ref 13.0–17.0)
MCH: 30.6 pg (ref 26.0–34.0)
MCHC: 33 g/dL (ref 30.0–36.0)
MCV: 92.9 fL (ref 80.0–100.0)
Platelets: 115 10*3/uL — ABNORMAL LOW (ref 150–400)
RBC: 3.1 MIL/uL — ABNORMAL LOW (ref 4.22–5.81)
RDW: 14.9 % (ref 11.5–15.5)
WBC: 5.3 10*3/uL (ref 4.0–10.5)
nRBC: 0 % (ref 0.0–0.2)

## 2022-05-31 LAB — GLUCOSE, CAPILLARY
Glucose-Capillary: 99 mg/dL (ref 70–99)
Glucose-Capillary: 116 mg/dL — ABNORMAL HIGH (ref 70–99)
Glucose-Capillary: 98 mg/dL (ref 70–99)
Glucose-Capillary: 148 mg/dL — ABNORMAL HIGH (ref 70–99)
Glucose-Capillary: 127 mg/dL — ABNORMAL HIGH (ref 70–99)

## 2022-05-31 LAB — HEPARIN LEVEL (UNFRACTIONATED): Heparin Unfractionated: 0.19 IU/mL — ABNORMAL LOW (ref 0.30–0.70)

## 2022-05-31 MED ORDER — GENTAMICIN SULFATE 0.1 % EX CREA
1.0000 | TOPICAL_CREAM | Freq: Every day | CUTANEOUS | Status: DC
  Administered 2022-05-31 – 2022-06-03 (×4): 1 via TOPICAL
  Filled 2022-05-31: qty 15

## 2022-05-31 MED ORDER — NITROGLYCERIN 2 % TD OINT
1.0000 [in_us] | TOPICAL_OINTMENT | Freq: Four times a day (QID) | TRANSDERMAL | Status: DC
  Administered 2022-05-31 – 2022-06-04 (×19): 1 [in_us] via TOPICAL
  Filled 2022-05-31 (×21): qty 1

## 2022-05-31 MED ORDER — SODIUM CHLORIDE 0.9% FLUSH: 3.0000 mL | INTRAVENOUS | Status: DC | PRN

## 2022-05-31 MED ORDER — MORPHINE SULFATE (PF) 2 MG/ML IV SOLN
1.0000 mg | INTRAVENOUS | Status: DC | PRN
  Administered 2022-06-02 – 2022-06-04 (×3): 1 mg via INTRAVENOUS
  Filled 2022-05-31 (×3): qty 1

## 2022-05-31 MED ORDER — DELFLEX-LC/2.5% DEXTROSE 394 MOSM/L IP SOLN: INTRAPERITONEAL | Status: DC

## 2022-05-31 MED ORDER — PERFLUTREN LIPID MICROSPHERE
1.0000 mL | INTRAVENOUS | Status: AC | PRN
  Administered 2022-05-31: 2 mL via INTRAVENOUS

## 2022-05-31 MED ORDER — DELFLEX-LC/1.5% DEXTROSE 344 MOSM/L IP SOLN: INTRAPERITONEAL | Status: DC

## 2022-05-31 NOTE — Progress Notes (Incomplete)
Echocardiogram 2D Echocardiogram has been performed.  [name] [name] 05/31/2022, 1:17 PM

## 2022-05-31 NOTE — Progress Notes (Signed)
ANTICOAGULATION CONSULT NOTE   Pharmacy Consult for heparin Indication: chest pain/ACS and post-cath with cardiac surgery consult  Allergies  Allergen Reactions   Opana [Oxymorphone Hcl] Itching   Tramadol Itching    Patient Measurements: Height: '5\' 4"'$  (162.6 cm) Weight: 98.4 kg (217 lb) IBW/kg (Calculated) : 59.2 Heparin Dosing Weight: 81.3kg  Vital Signs:    Labs: Recent Labs    05/30/22 0341 05/30/22 0640 05/30/22 1148 05/30/22 1507 05/30/22 1513 05/30/22 1517 05/31/22 0252 05/31/22 1742  HGB 8.9*  --   --    < > 8.5* 8.8* 9.5*  --   HCT 26.8*  --   --    < > 25.0* 26.0* 28.8*  --   PLT 126*  --   --   --   --   --  115*  --   HEPARINUNFRC  --   --   --   --   --   --   --  0.19*  CREATININE 17.29*  --   --   --   --   --  19.27*  --   TROPONINIHS 100* 142* 330*  --   --   --   --   --    < > = values in this interval not displayed.     Estimated Creatinine Clearance: 3.7 mL/min (A) (by C-G formula based on SCr of 19.27 mg/dL (H)).  Medications:  Infusions:   sodium chloride     dialysis solution 1.5% low-MG/low-CA     dialysis solution 2.5% low-MG/low-CA     heparin 1,100 Units/hr (05/31/22 1916)    Assessment: 46 yom with significant cardiac history including multiple episodes of restenosis presented to the ED with CP and was initiated on IV Heparin.   Patient went for cardiac catheterization today and found to have changed proximal LAD lesion. Plan for cardiac surgery consult and pharmacy consulted to resume IV Heparin 8 hours post-sheath pull.   Initial heparin level delayed due to difficulty with lab draws.  Finally able to obtain blood, and heparin level below goal at 0.19.  No overt bleeding or complications noted.   Goal of Therapy:  Heparin level 0.3-0.7 units/ml Monitor platelets by anticoagulation protocol: Yes   Plan:  Increase IV heparin to 1300 units/hr. Repeat heparin level in 8 hrs with AM labs. Daily heparin level and  CBC.  [place] Crane, Roylene Reason, BCCP Clinical Pharmacist  05/31/2022 7:40 PM   [facility] pharmacy phone numbers are listed on [email or web address]

## 2022-05-31 NOTE — Progress Notes (Signed)
Pt c/o 7/10 CP & nausea, upon returning w/ 2g IV morphine & '4mg'$  zofran, pain now 10/10 & vomiting. Notified by CCMD that strip was saved showing what they believed to be 2nd degree type 2. EKG obtained & resulted as 2nd degree type 1. Pain decreased to where pt was able to fall asleep. On call [facility] provider paged, per return call from Tannu, MD, page again if severe pain returns

## 2022-05-31 NOTE — Progress Notes (Signed)
PROGRESS NOTE    [name] [name]  BJS:[id number] DOB: Jan 20, 1951 DOA: 05/30/2022 PCP: Carrolyn Meiers, MD  Chief Complaint  Patient presents with   Chest Pain    Brief Narrative:  [name] [name] is [name] [name] 71 y.o. male with medical history significant of unstable angina, CAD with chronic RCA in-stent stenosis limited, ESRD on PD, anemia secondary to CKD, COPD, presented with worsening of chest pains.   Patient has Sherae Santino known RCA stenosis including in-stent stenosis identified in the cardiac cath earlier this year, for which patient has been following with cardiologist and has been on dual antiplatelet therapy including aspirin and Brilinta.  Despite, patient has had unstable angina episodes at home.  He reported usually those episodes responded to nitroglycerin, however last night, about 10 PM, patient started to have severe chest pain, pressure-like, associated with shortness of breath, he immediately took 3 nitroglycerin, and chest pain partial relief but in the matter of 20 to 30 minutes, chest pain came back again. ED Course: Patient was given nitroglycerin sublingual and then switched to Nitropaste and chest pain partially relieved.  Blood pressure elevated, x-ray showed mild pulmonary congestion but no hypoxia.   Assessment & Plan: Principal Problem:   NSTEMI (non-ST elevated myocardial infarction) ([place])  NSTEMI with known CAD and chronic RCA in-stent stenosis -On heparin drip -nitro paste ordered -Continue aspirin, Brilinta and statin - no RWMA, low normal EF -For breakthrough pains, will add PRN Morphine -Cardiology and CT surgery following, planning for 2 vessel off pump coronary bypass grafting   HTN, uncontrolled -Continue Nitropaste (placed this morning) -On torsemide   ESRD on PD -Neprho consulted    IDDM -Cut Lantus to 15 units daily, add sliding scale.   COPD -No acute concern, continue as needed albuterol     DVT prophylaxis: heparin gtt Code Status:  full Family Communication: none Disposition:   Status is: Inpatient Remains inpatient appropriate because: need for CT surgery eval   Consultants:  Cardiology CT surgery renal  Procedures:  Echo IMPRESSIONS     1. Left ventricular ejection fraction, by estimation, is 50 to 55%. The  left ventricle has low normal function. The left ventricle demonstrates  regional wall motion abnormalities (see scoring diagram/findings for  description). There is mild left  ventricular hypertrophy. Left ventricular diastolic parameters are  indeterminate.   2. Right ventricular systolic function is normal. The right ventricular  size is normal.   3. The mitral valve is normal in structure. Mild mitral valve  regurgitation. No evidence of mitral stenosis.   4. The aortic valve is calcified. There is mild calcification of the  aortic valve. Aortic valve regurgitation is mild. Aortic valve sclerosis  is present, with no evidence of aortic valve stenosis. Aortic valve mean  gradient measures 9.2 mmHg. Aortic  valve Vmax measures 2.16 m/s.   5. The inferior vena cava is normal in size with greater than 50%  respiratory variability, suggesting right atrial pressure of 3 mmHg.   LHC 2nd Diag lesion is 30% stenosed.   RV Branch lesion is 90% stenosed.   RPDA lesion is 80% stenosed.   Mid RCA-1 lesion is 90% stenosed.   Mid RCA-2 lesion is 90% stenosed.   Dist RCA lesion is 75% stenosed.   Prox LAD lesion is 75% stenosed.   Mid Cx lesion is 50% stenosed.   Non-stenotic Prox RCA lesion was previously treated.   LV end diastolic pressure is mildly elevated.   There is no aortic valve  stenosis.   Aortic saturation 97%, PA saturation 65%, PA pressure 35/16, mean PA pressure 24 mmHg, mean pulmonary capillary wedge pressure 17 mmHg, right atrial pressure 4/7 mmHg, cardiac output 6.94 L/min, cardiac index 3.43.   Severe in-stent restenosis in the mid RCA.  Denovo distal RCA lesion.  Back in July 2023,  there was Berdine Rasmusson moderate lesion but this has increased significantly. Proximal LAD lesion worsened as well compared to July 2023.  There is now an irregular 75% stenosis. Essentially normal right heart pressures.   Given multiple episodes of restenosis, repeat PCI will not likely given the long-term benefit.  Now with proximal LAD lesion which has changed in the last few months, will get cardiac surgery consult.  Will hold antiplatelet therapy in the event that he is Carla Rashad candidate for surgery.  May need to start IV cangrelor for antiplatelet therapy.   Antimicrobials:  Anti-infectives (From admission, onward)    None       Subjective: Notes cp, comes and goes 5/10  Objective: Vitals:   05/30/22 2030 05/30/22 2251 05/31/22 0016 05/31/22 0349  BP: 138/82 96/60 120/64 120/72  Pulse: 88  86   Resp: '18  18 17  '$ Temp: (!) 97.5 F (36.4 C)  (!) 97.5 F (36.4 C) (!) 97.4 F (36.3 C)  TempSrc: Oral  Oral Oral  SpO2: 91%  99% 96%  Weight:      Height:        Intake/Output Summary (Last 24 hours) at 05/31/2022 1429 Last data filed at 05/30/2022 2057 Gross per 24 hour  Intake 240 ml  Output --  Net 240 ml   Filed Weights   05/30/22 0346  Weight: 98.4 kg    Examination:  General exam: Appears calm and comfortable sitting in chair, moves to bed Respiratory system: Clear to auscultation. Respiratory effort normal. Cardiovascular system: RRR Gastrointestinal system: Abdomen is nondistended, soft and nontender.  Central nervous system: Alert and oriented. No focal neurological deficits. Extremities: bi KEE   Data Reviewed: I have personally reviewed following labs and imaging studies  CBC: Recent Labs  Lab 05/30/22 0341 05/30/22 1507 05/30/22 1513 05/30/22 1517 05/31/22 0252  WBC 4.4  --   --   --  5.3  NEUTROABS 3.3  --   --   --   --   HGB 8.9* 8.5* 8.5* 8.8* 9.5*  HCT 26.8* 25.0* 25.0* 26.0* 28.8*  MCV 94.0  --   --   --  92.9  PLT 126*  --   --   --  115*     Basic Metabolic Panel: Recent Labs  Lab 05/30/22 0341 05/30/22 1148 05/30/22 1507 05/30/22 1513 05/30/22 1517 05/31/22 0252  NA 136  --  135 135 135 136  K 3.6  --  3.5 3.5 3.5 4.0  CL 96*  --   --   --   --  98  CO2 26  --   --   --   --  27  GLUCOSE 128*  --   --   --   --  101*  BUN 44*  --   --   --   --  48*  CREATININE 17.29*  --   --   --   --  19.27*  CALCIUM 9.9  --   --   --   --  9.4  MG  --  2.1  --   --   --   --     GFR: Estimated  Creatinine Clearance: 3.7 mL/min (Melora Menon) (by C-G formula based on SCr of 19.27 mg/dL (H)).  Liver Function Tests: Recent Labs  Lab 05/30/22 1148  AST 14*  ALT 13  ALKPHOS 68  BILITOT 0.3  PROT 6.4*  ALBUMIN 3.0*    CBG: Recent Labs  Lab 05/30/22 1146 05/30/22 1557 05/30/22 2253 05/31/22 0803 05/31/22 1214  GLUCAP 98 88 91 99 116*     No results found for this or any previous visit (from the past 240 hour(s)).       Radiology Studies: ECHOCARDIOGRAM COMPLETE  Result Date: 05/31/2022    ECHOCARDIOGRAM REPORT   Patient Name:   [name] [name] Date of Exam: 05/31/2022 Medical Rec #:  [id number]      Height:       64.0 in Accession #:    [id number]     Weight:       217.0 lb Date of Birth:  02/08/1951     BSA:          2.025 m Patient Age:    45 years       BP:           120/72 mmHg Patient Gender: M              HR:           49 bpm. Exam Location:  Inpatient Procedure: 2D Echo, Cardiac Doppler, Color Doppler and Intracardiac            Opacification Agent Indications:    NSTEMI I21..4  History:        Patient has prior history of Echocardiogram examinations, most                 recent 01/19/2022. CAD, Previous Myocardial Infarction and Angina,                 Signs/Symptoms:Chest Pain; Risk Factors:Hypertension, Sleep                 Apnea, Diabetes and Dyslipidemia.  Sonographer:    Ronny Flurry Sonographer#2:  Clayton Lefort RDCS (AE) Referring Phys: [id number] Jettie Booze  Sonographer Comments: Image acquisition  challenging due to respiratory motion. IMPRESSIONS  1. Left ventricular ejection fraction, by estimation, is 50 to 55%. The left ventricle has low normal function. The left ventricle demonstrates regional wall motion abnormalities (see scoring diagram/findings for description). There is mild left ventricular hypertrophy. Left ventricular diastolic parameters are indeterminate.  2. Right ventricular systolic function is normal. The right ventricular size is normal.  3. The mitral valve is normal in structure. Mild mitral valve regurgitation. No evidence of mitral stenosis.  4. The aortic valve is calcified. There is mild calcification of the aortic valve. Aortic valve regurgitation is mild. Aortic valve sclerosis is present, with no evidence of aortic valve stenosis. Aortic valve mean gradient measures 9.2 mmHg. Aortic valve Vmax measures 2.16 m/s.  5. The inferior vena cava is normal in size with greater than 50% respiratory variability, suggesting right atrial pressure of 3 mmHg. FINDINGS  Left Ventricle: Left ventricular ejection fraction, by estimation, is 50 to 55%. The left ventricle has low normal function. The left ventricle demonstrates regional wall motion abnormalities. Definity contrast agent was given IV to delineate the left ventricular endocardial borders. The left ventricular internal cavity size was normal in size. There is mild left ventricular hypertrophy. Left ventricular diastolic parameters are indeterminate.  LV Wall Scoring: The mid inferoseptal segment, apical septal segment, and apical inferior segment  are akinetic. Right Ventricle: The right ventricular size is normal. No increase in right ventricular wall thickness. Right ventricular systolic function is normal. Left Atrium: Left atrial size was normal in size. Right Atrium: Right atrial size was normal in size. Pericardium: There is no evidence of pericardial effusion. Mitral Valve: The mitral valve is normal in structure. Mild mitral  valve regurgitation. No evidence of mitral valve stenosis. MV peak gradient, 18.5 mmHg. The mean mitral valve gradient is 6.0 mmHg. Tricuspid Valve: The tricuspid valve is normal in structure. Tricuspid valve regurgitation is mild . No evidence of tricuspid stenosis. Aortic Valve: The aortic valve is calcified. There is mild calcification of the aortic valve. Aortic valve regurgitation is mild. Aortic valve sclerosis is present, with no evidence of aortic valve stenosis. Aortic valve mean gradient measures 9.2 mmHg. Aortic valve peak gradient measures 18.7 mmHg. Aortic valve area, by VTI measures 1.54 cm. Pulmonic Valve: The pulmonic valve was normal in structure. Pulmonic valve regurgitation is not visualized. No evidence of pulmonic stenosis. Aorta: The aortic root is normal in size and structure. Venous: The inferior vena cava is normal in size with greater than 50% respiratory variability, suggesting right atrial pressure of 3 mmHg. IAS/Shunts: No atrial level shunt detected by color flow Doppler.  LEFT VENTRICLE PLAX 2D LVIDd:         5.70 cm     Diastology LVIDs:         3.60 cm     LV e' medial:    9.57 cm/s LV PW:         1.20 cm     LV E/e' medial:  14.9 LV IVS:        1.10 cm     LV e' lateral:   12.30 cm/s LVOT diam:     1.70 cm     LV E/e' lateral: 11.6 LV SV:         73 LV SV Index:   36 LVOT Area:     2.27 cm  LV Volumes (MOD) LV vol d, MOD A4C: 84.5 ml LV SV MOD A4C:     84.5 ml RIGHT VENTRICLE             IVC RV S prime:     15.70 cm/s  IVC diam: 2.10 cm TAPSE (M-mode): 2.9 cm LEFT ATRIUM             Index        RIGHT ATRIUM           Index LA diam:        4.90 cm 2.42 cm/m   RA Area:     19.20 cm LA Vol (A2C):   41.1 ml 20.29 ml/m  RA Volume:   50.80 ml  25.08 ml/m LA Vol (A4C):   56.5 ml 27.90 ml/m LA Biplane Vol: 48.4 ml 23.90 ml/m  AORTIC VALVE AV Area (Vmax):    1.52 cm AV Area (Vmean):   1.51 cm AV Area (VTI):     1.54 cm AV Vmax:           216.20 cm/s AV Vmean:          138.200  cm/s AV VTI:            0.472 m AV Peak Grad:      18.7 mmHg AV Mean Grad:      9.2 mmHg LVOT Vmax:         144.67 cm/s LVOT Vmean:  91.800 cm/s LVOT VTI:          0.320 m LVOT/AV VTI ratio: 0.68  AORTA Ao Root diam: 3.20 cm Ao Asc diam:  3.60 cm MITRAL VALVE                TRICUSPID VALVE MV Area VTI:  1.40 cm      TR Peak grad:   34.6 mmHg MV Peak grad: 18.5 mmHg     TR Vmax:        294.00 cm/s MV Mean grad: 6.0 mmHg MV Vmax:      2.15 m/s      SHUNTS MV Vmean:     104.0 cm/s    Systemic VTI:  0.32 m MR Peak grad: 94.1 mmHg     Systemic Diam: 1.70 cm MR Mean grad: 67.0 mmHg MR Vmax:      485.00 cm/s MR Vmean:     390.0 cm/s MV E velocity: 143.00 cm/s Candee Furbish MD Electronically signed by Candee Furbish MD Signature Date/Time: 05/31/2022/1:39:19 PM    Final    CARDIAC CATHETERIZATION  Addendum Date: 05/30/2022     2nd Diag lesion is 30% stenosed.   RV Branch lesion is 90% stenosed.   RPDA lesion is 80% stenosed.   Mid RCA-1 lesion is 90% stenosed.   Mid RCA-2 lesion is 90% stenosed.   Dist RCA lesion is 75% stenosed.   Prox LAD lesion is 75% stenosed.   Mid Cx lesion is 50% stenosed.   Non-stenotic Prox RCA lesion was previously treated.   LV end diastolic pressure is mildly elevated.   There is no aortic valve stenosis.   Aortic saturation 97%, PA saturation 65%, PA pressure 35/16, mean PA pressure 24 mmHg, mean pulmonary capillary wedge pressure 17 mmHg, right atrial pressure 4/7 mmHg, cardiac output 6.94 L/min, cardiac index 3.43. Severe in-stent restenosis in the mid RCA.  Denovo distal RCA lesion.  Back in July 2023, there was Lenita Peregrina moderate lesion but this has increased significantly. Proximal LAD lesion worsened as well compared to July 2023.  There is now an irregular 75% stenosis. Essentially normal right heart pressures. Given multiple episodes of restenosis, repeat PCI will not likely given the long-term benefit.  Now with proximal LAD lesion which has changed in the last few months, will get  cardiac surgery consult.  Will hold antiplatelet therapy in the event that he is Deepa Barthel candidate for surgery.  May need to start IV cangrelor for antiplatelet therapy.  Result Date: 05/30/2022   2nd Diag lesion is 30% stenosed.   RV Branch lesion is 90% stenosed.   RPDA lesion is 80% stenosed.   Mid RCA-1 lesion is 90% stenosed.   Mid RCA-2 lesion is 90% stenosed.   Dist RCA lesion is 75% stenosed.   Prox LAD lesion is 75% stenosed.   Mid Cx lesion is 50% stenosed.   Non-stenotic Prox RCA lesion was previously treated.   LV end diastolic pressure is mildly elevated.   There is no aortic valve stenosis.   Aortic saturation 97%, PA saturation 65%, PA pressure 35/16, mean PA pressure 24 mmHg, mean pulmonary capillary wedge pressure 17 mmHg, right atrial pressure 4/7 mmHg, cardiac output 6.94 L/min, cardiac index 3.43. Severe in-stent restenosis in the mid RCA.  Denovo distal RCA lesion.  Back in July 2023, there was Brecklynn Jian moderate lesion but this has increased significantly. Proximal LAD lesion worsened as well compared to July 2023.  There is now an irregular 75% stenosis. Essentially  normal right heart pressures. Given multiple episodes of restenosis, repeat PCI will not likely given the long-term benefit.  Now with proximal LAD lesion which has changed in the last few months, will get cardiac surgery consult.  Will hold antiplatelet therapy in the event that he is Marque Rademaker candidate for surgery.  May need to start IV cangrelor for antiplatelet therapy.   DG Chest 2 View  Result Date: 05/30/2022 CLINICAL DATA:  Chest pain EXAM: CHEST - 2 VIEW COMPARISON:  01/18/2022 FINDINGS: The heart size and mediastinal contours are within normal limits. Atheromatous calcification both lungs are clear. The visualized skeletal structures are unremarkable. IMPRESSION: No active cardiopulmonary disease. Electronically Signed   By: Jorje Guild M.D.   On: 05/30/2022 04:18        Scheduled Meds:  aspirin EC  81 mg Oral Daily    calcitRIOL  0.25 mcg Oral Daily   gentamicin cream  1 Application Topical Daily   insulin aspart  0-6 Units Subcutaneous TID WC   insulin glargine-yfgn  15 Units Subcutaneous Daily   linagliptin  5 mg Oral Daily   nitroGLYCERIN  1 inch Topical Q6H   pantoprazole  40 mg Oral BID AC   sevelamer carbonate  1,600 mg Oral TID WC   simvastatin  20 mg Oral q morning   sodium chloride flush  3 mL Intravenous Q12H   sodium chloride flush  3 mL Intravenous Q12H   torsemide  100 mg Oral Daily   Continuous Infusions:  sodium chloride     dialysis solution 1.5% low-MG/low-CA     dialysis solution 2.5% low-MG/low-CA     heparin 1,100 Units/hr (05/31/22 0142)     LOS: 1 day    Time spent: over 30 min    Fayrene Helper, MD [facility]   To contact the attending provider between 7A-7P or the covering provider during after hours 7P-7A, please log into the web site [email or web address] and access using universal  password for that web site. If you do not have the password, please call the hospital operator.  05/31/2022, 2:29 PM

## 2022-05-31 NOTE — Consult Note (Cosign Needed)
[address]       [place] [address]             [phone]        Krishiv W Lombardozzi [place] Medical Record #[id number] Date of Birth: Aug 03, 1950  Referring: Lelon Perla, MD  Primary Care: Carrolyn Meiers, MD Primary Cardiologist:Kenneth Wells Guiles, MD  Reason for Consult: Progressive two-vessel coronary artery disease presenting with unstable angina pectoris / NSTEMI   History of Present Illness:   Mr. Aariz Maish is a 71 year old gentleman with a past history of hypertension, end-stage renal disease managed with nocturnal peritoneal dialysis, type 2 diabetes mellitus, obstructive sleep apnea, second-degree Mobitz type I heart block with known nocturnal complete heart block followed by Dr. Caryl Comes.  Mr. Mcclintock is a former 25-pack-year tobacco smoker having quit about 12 years ago.  He has known coronary artery disease presented with acute angina in January of this year.  He was found to have severe obstructive disease of the right coronary artery treated with a drug-eluting stent.  He returned in June of this year with recurring symptoms and was found to have an in-stent stenosis treated with drug-eluting stent.  He has been on aspirin and Brilinta since then.  He has continued to have angina off and on but had a particularly intense episode prior to his admission 2 days ago.  Work-up in the emergency room included high-sensitivity troponin elevated at 100 and was "level was 142.  EKG showed second-degree Mobitz type I heart block similar to previous EKGs.  He had no acute ST or T wave abnormalities.  He was admitted to the hospital and treated medically for his angina with no further chest pain.  He underwent left heart catheterization earlier today demonstrating severe in-stent RCA stenosis as well as progression of his LAD stenosis from 50% back in June to 75% today.  It is an irregular lesion. CT surgery has been asked to evaluate Mr. Melrose for  consideration of coronary bypass grafting.  Repeat echo gram is pending but of the echo obtained earlier this year shows preserved biventricular function with left ventricular ejection fraction of 65 to 70%.  There was mitral and aortic annular calcification but no peripheral or functional valve abnormalities.  Currently, Mr. Marsalis is resting in bed.  He reports having 4/10 substernal chest pain.  He has heparin infusing. He tells me he was fairly active before this most recent episode of chest pain.  He had been walking about 1 mile a day.  He lives at home with his wife who is in good health and will be able to assist him after discharge.  Current Activity/ Functional Status: Patient is independent with mobility/ambulation, transfers, ADL's, IADL's.   Zubrod Score: At the time of surgery this patient's most appropriate activity status/level should be described as: '[]'$     0    Normal activity, no symptoms '[]'$     1    Restricted in physical strenuous activity but ambulatory, able to do out light work '[]'$     2    Ambulatory and capable of self care, unable to do work activities, up and about                 more than 50%  Of the time                            '[x]'$   3    Only limited self care, in bed greater than 50% of waking hours '[]'$     4    Completely disabled, no self care, confined to bed or chair '[]'$     5    Moribund  Past Medical History:  Diagnosis Date   Anemia    Arthritis    Asthma    Bell palsy    CAD (coronary artery disease)    a. 2014 MV: abnl w/ infap ischemia; b. 03/2013 Cath: aneurysmal bleb in the LAD w/ otw nonobs dzs-->Med Rx.   Chronic back pain    Chronic knee pain    a. 09/2015 s/p R TKA.   Chronic pain    Chronic shoulder pain    Chronic sinusitis    COPD (chronic obstructive pulmonary disease) ([place])    Diabetes mellitus without complication ([place])    type II    ESRD on peritoneal dialysis ([place])    on peritoneal dialysis, [facility]   Essential  hypertension    GERD (gastroesophageal reflux disease)    Gout    Gout    Hepatomegaly    noted on noncontrast CT 2015   History of hiatal hernia    Hyperlipidemia    Lateral meniscus tear    Obesity    Truncal   Obstructive sleep apnea    does not use cpap    On home oxygen therapy    uses 2l when is going somewhere per patient    PUD (peptic ulcer disease)    remote, reports f/u EGD about 8 years ago unremarkable    Reactive airway disease    related to exposure to chemical during 9/11   Sinusitis    Vitamin D deficiency     Past Surgical History:  Procedure Laterality Date   ASAD LT SHOULDER  12/15/2008   left shoulder   AV FISTULA PLACEMENT Left 08/09/2016   Procedure: BRACHIOCEPHALIC ARTERIOVENOUS (AV) FISTULA CREATION LEFT ARM;  Surgeon: [name], MD;  Location: [facility];  Service: Vascular;  Laterality: Left;   CAPD INSERTION N/A 10/07/2018   Procedure: LAPAROSCOPIC PERITONEAL CATHETER PLACEMENT;  Surgeon: Mickeal Skinner, MD;  Location: [facility];  Service: General;  Laterality: N/A;   CATARACT EXTRACTION W/PHACO Left 03/28/2016   Procedure: CATARACT EXTRACTION PHACO AND INTRAOCULAR LENS PLACEMENT LEFT EYE;  Surgeon: [name], MD;  Location: [facility];  Service: Ophthalmology;  Laterality: Left;  CDE: 4.77   CATARACT EXTRACTION W/PHACO Right 04/11/2016   Procedure: CATARACT EXTRACTION PHACO AND INTRAOCULAR LENS PLACEMENT RIGHT EYE; CDE:  4.74;  Surgeon: [name], MD;  Location: [facility];  Service: Ophthalmology;  Laterality: Right;   COLONOSCOPY  10/15/2008   Fields: Rectal polyp obliterated, not retrieved, hemorrhoids, single ascending colon diverticulum near the CV. Next colonoscopy April 2020   COLONOSCOPY N/A 12/25/2014   SLF: 1. Colorectal polyps (2) removed 2. Small internal hemorrhoids 3. the left colon is severely redundant. hyperplastic polyps   CORONARY STENT INTERVENTION N/A 07/25/2021   Procedure: CORONARY STENT INTERVENTION;  Surgeon: Sherren Mocha, MD;  Location: [facility];  Service: Cardiovascular;  Laterality: N/A;   CORONARY STENT INTERVENTION N/A 12/26/2021   Procedure: CORONARY STENT INTERVENTION;  Surgeon: Nelva Bush, MD;  Location: [facility];  Service: Cardiovascular;  Laterality: N/A;   CORONARY STENT INTERVENTION N/A 01/20/2022   Procedure: CORONARY STENT INTERVENTION;  Surgeon: Sherren Mocha, MD;  Location: [facility];  Service: Cardiovascular;  Laterality: N/A;  DOPPLER ECHOCARDIOGRAPHY     ESOPHAGOGASTRODUODENOSCOPY N/A 12/25/2014   SLF: 1. Anemia most likely due to CRI, gastritis, gastric polyps 2. Moderate non-erosive gastriits and mild duodenitis.  3.TWo large gstric polyps removed.    EYE SURGERY  12/22/2010   tear duct probing-[place]   FOREIGN BODY REMOVAL  03/29/2011   Procedure: REMOVAL FOREIGN BODY EXTREMITY;  Surgeon: Arther Abbott, MD;  [facility];  Service: Orthopedics;  Laterality: Right;  Removal Foreign Body Right Thumb   IR FLUORO GUIDE CV LINE RIGHT  08/06/2018   IR US GUIDE VASC ACCESS RIGHT  08/06/2018   KNEE ARTHROSCOPY  10/16/2007   left   KNEE ARTHROSCOPY WITH LATERAL MENISECTOMY Right 10/14/2015   Procedure: LEFT KNEE ARTHROSCOPY WITH PARTIAL LATERAL MENISECTOMY;  Surgeon: Carole Civil, MD;  [facility];  Service: Orthopedics;  Laterality: Right;   LEFT HEART CATH AND CORONARY ANGIOGRAPHY N/A 07/25/2021   Procedure: LEFT HEART CATH AND CORONARY ANGIOGRAPHY;  Surgeon: Sherren Mocha, MD;  Location: [facility];  Service: Cardiovascular;  Laterality: N/A;   LEFT HEART CATH AND CORONARY ANGIOGRAPHY N/A 12/26/2021   Procedure: LEFT HEART CATH AND CORONARY ANGIOGRAPHY;  Surgeon: Nelva Bush, MD;  Location: [facility];  Service: Cardiovascular;  Laterality: N/A;   LEFT HEART CATH AND CORONARY ANGIOGRAPHY N/A 01/20/2022   Procedure: LEFT HEART CATH AND CORONARY ANGIOGRAPHY;  Surgeon: Sherren Mocha, MD;  Location: [facility];  Service: Cardiovascular;  Laterality: N/A;   LEFT HEART CATHETERIZATION WITH CORONARY ANGIOGRAM N/A 03/28/2013   Procedure: LEFT HEART CATHETERIZATION WITH CORONARY ANGIOGRAM;  Surgeon: Leonie Man, MD;  Location: [facility];  Service: Cardiovascular;  Laterality: N/A;   NM MYOVIEW LTD     PENILE PROSTHESIS IMPLANT N/A 08/16/2015   Procedure: PENILE PROTHESIS INFLATABLE, three piece, Excisional biopsy of Penile ulcer, Penile molding;  Surgeon: Carolan Clines, MD;  [facility];  Service: Urology;  Laterality: N/A;   PENILE PROSTHESIS IMPLANT N/A 12/24/2017   Procedure: REMOVAL AND  REPLACEMENT  COLOPLAST PENILE PROSTHESIS;  Surgeon: Lucas Mallow, MD;  [facility];  Service: Urology;  Laterality: N/A;   QUADRICEPS TENDON REPAIR  07/21/2011   Procedure: REPAIR QUADRICEP TENDON;  Surgeon: Arther Abbott, MD;  [facility]: [facility];  Service: Orthopedics;  Laterality: Right;   RIGHT/LEFT HEART CATH AND CORONARY ANGIOGRAPHY N/A 05/30/2022   Procedure: RIGHT/LEFT HEART CATH AND CORONARY ANGIOGRAPHY;  Surgeon: Jettie Booze, MD;  Location: [facility];  Service: Cardiovascular;  Laterality: N/A;   TOENAIL EXCISION     removed [id number]   UMBILICAL HERNIA REPAIR  07/17/2005   [place]    Social History   Tobacco Use  Smoking Status Former   Packs/day: 1.00   Years: 25.00   Total pack years: 25.00   Types: Cigarettes   Quit date: 03/27/2010   Years since quitting: 12.1  Smokeless Tobacco Never  Tobacco Comments   Quit x 7 years    Social History   Substance and Sexual Activity  Alcohol Use Not Currently   Comment: occasionally     Allergies  Allergen Reactions   Opana [Oxymorphone Hcl] Itching   Tramadol Itching    Current Facility-Administered Medications  Medication Dose Route Frequency Provider Last Rate Last Admin   0.9 %  sodium chloride infusion  250 mL Intravenous PRN Jettie Booze, MD       acetaminophen (TYLENOL) tablet  650 mg  650 mg Oral Q4H PRN Irish Lack,  Charlann Lange, MD   650 mg at 05/31/22 0350   albuterol (PROVENTIL) (2.5 MG/3ML) 0.083% nebulizer solution 2.5 mg  2.5 mg Inhalation Q6H PRN Jettie Booze, MD       aspirin EC tablet 81 mg  81 mg Oral Daily Jettie Booze, MD   81 mg at 05/31/22 [id number]   calcitRIOL (ROCALTROL) capsule 0.25 mcg  0.25 mcg Oral Daily Jettie Booze, MD   0.25 mcg at 05/31/22 0842   heparin ADULT infusion 100 units/mL (25000 units/237m)  1,100 Units/hr Intravenous Continuous MPriscella Mann RPH 11 mL/hr at 05/31/22 0142 1,100 Units/hr at 05/31/22 0142   insulin aspart (novoLOG) injection 0-6 Units  0-6 Units Subcutaneous TID WC VJettie Booze MD       insulin glargine-yfgn ([facility] injection 15 Units  15 Units Subcutaneous Daily VJettie Booze MD       linagliptin (TRADJENTA) tablet 5 mg  5 mg Oral Daily VJettie Booze MD   5 mg at 05/31/22 0842   morphine (PF) 2 MG/ML injection 1-4 mg  1-4 mg Intravenous Q4H PRN VJettie Booze MD   2 mg at 05/30/22 2221   nitroGLYCERIN (NITROGLYN) 2 % ointment 1 inch  1 inch Topical Q6H CLelon Perla MD       nitroGLYCERIN (NITROSTAT) SL tablet 0.4 mg  0.4 mg Sublingual Q5 Min x 3 PRN VLarae GroomsS, MD       ondansetron ([facility] injection 4 mg  4 mg Intravenous Q6H PRN VJettie Booze MD   4 mg at 05/30/22 2221   pantoprazole (PROTONIX) EC tablet 40 mg  40 mg Oral BID AC VJettie Booze MD   40 mg at 05/31/22 03419  sevelamer carbonate (RENVELA) tablet 1,600 mg  1,600 mg Oral TID WC VJettie Booze MD   1,600 mg at 05/31/22 0842   simvastatin (ZOCOR) tablet 20 mg  20 mg Oral q morning VJettie Booze MD   20 mg at 05/31/22 0842   sodium chloride flush (NS) 0.9 % injection 3 mL  3 mL Intravenous Q12H VJettie Booze MD   3 mL at 05/30/22 1219   sodium chloride flush (NS) 0.9 % injection 3 mL  3 mL Intravenous Q12H VJettie Booze MD   3 mL at 05/31/22 0846    sodium chloride flush (NS) 0.9 % injection 3 mL  3 mL Intravenous PRN VJettie Booze MD       torsemide ([facility] tablet 100 mg  100 mg Oral Daily VJettie Booze MD   100 mg at 05/31/22 0841    Medications Prior to Admission  Medication Sig Dispense Refill Last Dose   albuterol (VENTOLIN HFA) 108 (90 Base) MCG/ACT inhaler Inhale 2 puffs into the lungs every 6 (six) hours as needed for wheezing or shortness of breath. 18 g 11 05/27/2022 at pm   aspirin EC 81 MG tablet Take 81 mg by mouth daily. Swallow whole.   05/29/2022 at am   calcitRIOL (ROCALTROL) 0.25 MCG capsule Take 0.25 mcg by mouth daily.   05/29/2022   insulin glargine (LANTUS SOLOSTAR) 100 UNIT/ML Solostar Pen Inject 22 Units into the skin at bedtime. 30 mL 0 05/28/2022   isosorbide mononitrate (IMDUR) 60 MG 24 hr tablet TAKE 1 TABLET(60 MG) BY MOUTH DAILY (Patient taking differently: Take 60 mg by mouth daily.) 30 tablet 10 05/29/2022   nitroGLYCERIN (NITROSTAT) 0.4 MG SL tablet Place 1 tablet (0.4 mg total) under the  tongue every 5 (five) minutes as needed for chest pain. 30 tablet 1 05/29/2022   pantoprazole (PROTONIX) 40 MG tablet Take 1 tablet (40 mg total) by mouth 2 (two) times daily before a meal. 60 tablet 5 05/29/2022 at am   sevelamer (RENAGEL) 800 MG tablet Take 2,400 mg by mouth 3 (three) times daily with meals. Take 1 tablet with snacks   05/29/2022 at afternoon   simvastatin (ZOCOR) 20 MG tablet Take 20 mg by mouth every morning.   05/29/2022 at am   ticagrelor (BRILINTA) 90 MG TABS tablet TAKE 1 TABLET(90 MG) BY MOUTH TWICE DAILY (Patient taking differently: Take 90 mg by mouth 2 (two) times daily.) 180 tablet 3 05/29/2022 at 0500   torsemide (DEMADEX) 100 MG tablet Take 100 mg by mouth daily.   05/29/2022 at am   TRADJENTA 5 MG TABS tablet TAKE 1 TABLET EVERY DAY (Patient taking differently: Take 5 mg by mouth daily.) 90 tablet 10 05/29/2022 at am   DROPLET PEN NEEDLES 31G X 5 MM MISC USE TO INJECT  INSULIN DAILY AS DIRECTED 100 each 1    ONETOUCH ULTRA test strip USE TO CHECK BLOOD SUGAR TWICE DAILY 200 strip 2    Vitamin D, Ergocalciferol, (DRISDOL) 1.25 MG (50000 UNIT) CAPS capsule Take 50,000 Units by mouth every 7 (seven) days. Monday (Patient not taking: Reported on 05/30/2022)   Not Taking    Family History  Problem Relation Age of Onset   Hypertension Mother        MI   Cancer Mother        breast    Diabetes Mother    Diabetes Father    Hypertension Father    Hypertension Sister    Diabetes Sister    Arthritis Other    Asthma Other    Lung disease Other    Anesthesia problems Neg Hx    Hypotension Neg Hx    Malignant hyperthermia Neg Hx    Pseudochol deficiency Neg Hx    Colon cancer Neg Hx      Review of Systems:       Cardiac Review of Systems: Y or  [    ]= no  Chest Pain [  x  ]  Resting SOB [   ] Exertional SOB  [ x ]  Orthopnea [  ]   Pedal Edema [   ]    Palpitations [  ] Syncope  [  ]   Presyncope [   ]  General Review of Systems: [Y] = yes [  ]=no Constitional: recent weight change [  ]; anorexia [  ]; fatigue [  ]; nausea [  ]; night sweats [  ]; fever [  ]; or chills [  ]                                                                Eye : blurred vision [  ]; diplopia [   ]; vision changes [  ];  Amaurosis fugax[  ]; Resp: cough [  ];  wheezing[  ];  hemoptysis[  ]; shortness of breath[  ]; paroxysmal nocturnal dyspnea[  ]; dyspnea on exertion[x  ]; or orthopnea[  ];  GI:  gallstones[  ], vomiting[  ];  dysphagia[  ]; melena[  ];  hematochezia [  ]; heartburn[  ];   Hx of  Colonoscopy[  ]; GU: kidney stones [  ]; hematuria[  ];   dysuria [  ];  nocturia[  ];  history of     obstruction [  ]; urinary frequency [  ]             Skin: rash, swelling[  ];, hair loss[  ];  peripheral edema[  ];  or itching[  ]; Musculosketetal: myalgias[  ];  joint swelling[  ];  joint erythema[  ];  joint pain[  ];  back pain[  ];  Heme/Lymph: bruising[  ];  bleeding[   ];  anemia[  ];  Neuro: TIA[  ];  headaches[  ];  stroke[  ];  vertigo[  ];  seizures[  ];   paresthesias[  ];  difficulty walking[  ];  Psych:depression[  ]; anxiety[  ];  Endocrine: diabetes[ x ];  thyroid dysfunction[  ];                 Physical Exam: BP 120/72 (BP Location: Right Arm)   Pulse 86   Temp (!) 97.4 F (36.3 C) (Oral)   Resp 17   Ht '5\' 4"'$  (1.626 m)   Wt 98.4 kg   SpO2 96%   BMI 37.25 kg/m    General appearance: alert, cooperative, and mild distress Head: Normocephalic, without obvious abnormality, atraumatic Neck: no adenopathy, no carotid bruit, and supple, symmetrical, trachea midline Resp: Mild wheezing bilateral. Cardio: Heart rate around 45.  Monitor shows second-degree heart block 2:1 conduction. GI: Soft, nontender, normal bowel sounds Extremities: No obvious deformities.  All are well-perfused with palpable distal pulses.  He has an AV fistula in his left upper arm that never matured to the point to allow for hemodialysis access. Neurologic: Grossly normal  Diagnostic Studies & Laboratory data:     Recent Radiology Findings:    RIGHT/LEFT HEART CATH AND CORONARY ANGIOGRAPHY   Conclusion      2nd Diag lesion is 30% stenosed.   RV Branch lesion is 90% stenosed.   RPDA lesion is 80% stenosed.   Mid RCA-1 lesion is 90% stenosed.   Mid RCA-2 lesion is 90% stenosed.   Dist RCA lesion is 75% stenosed.   Prox LAD lesion is 75% stenosed.   Mid Cx lesion is 50% stenosed.   Non-stenotic Prox RCA lesion was previously treated.   LV end diastolic pressure is mildly elevated.   There is no aortic valve stenosis.   Aortic saturation 97%, PA saturation 65%, PA pressure 35/16, mean PA pressure 24 mmHg, mean pulmonary capillary wedge pressure 17 mmHg, right atrial pressure 4/7 mmHg, cardiac output 6.94 L/min, cardiac index 3.43.   Severe in-stent restenosis in the mid RCA.  Denovo distal RCA lesion.  Back in July 2023, there was a moderate lesion but this  has increased significantly. Proximal LAD lesion worsened as well compared to July 2023.  There is now an irregular 75% stenosis. Essentially normal right heart pressures.   Given multiple episodes of restenosis, repeat PCI will not likely given the long-term benefit.  Now with proximal LAD lesion which has changed in the last few months, will get cardiac surgery consult.  Will hold antiplatelet therapy in the event that he is a candidate for surgery.  May need to start IV cangrelor for antiplatelet therapy..   Coronary Findings  Diagnostic Dominance: Right Left Main  There  is mild diffuse disease throughout the vessel.    Left Anterior Descending  There is mild diffuse disease throughout the vessel.  Prox LAD lesion is 75% stenosed. The lesion is calcified.    Second Diagonal Branch  2nd Diag lesion is 30% stenosed.    Left Circumflex  There is mild diffuse disease throughout the vessel.  Mid Cx lesion is 50% stenosed.    First Obtuse Marginal Branch    Right Coronary Artery  Non-stenotic Prox RCA lesion was previously treated. The lesion is severely calcified.  Mid RCA-1 lesion is 90% stenosed. The lesion is calcified. The lesion was previously treated using angioplasty between 1-5 months ago. Exophytic calcification  Mid RCA-2 lesion is 90% stenosed. The lesion is moderately calcified. The lesion was previously treated using angioplasty between 1-5 months ago. Severe in-stent restenosis is noted.  Dist RCA lesion is 75% stenosed. Exophytic calcified lesion.    Right Ventricular Branch  RV Branch lesion is 90% stenosed.    Right Posterior Descending Artery  RPDA lesion is 80% stenosed.    Intervention   No interventions have been documented.   Right Heart  Right Heart Pressures Aortic saturation 97%, PA saturation 65%, PA pressure 35/16, mean PA pressure 24 mmHg, mean pulmonary capillary wedge pressure 17 mmHg, right atrial pressure 4/7 mmHg, cardiac output 6.94 L/min,  cardiac index 3.43.   Left Heart  Left Ventricle LV end diastolic pressure is mildly elevated.  Aortic Valve There is no aortic valve stenosis.   Coronary Diagrams  Diagnostic Dominance: Right     ECHOCARDIOGRAM LIMITED REPORT       Patient Name:   MANFORD SPRONG Date of Exam: 01/19/2022  Medical Rec #:  [id number]      Height:       64.0 in  Accession #:    [id number]     Weight:       223.8 lb  Date of Birth:  [birth year]     BSA:          2.052 m  Patient Age:    83 years       BP:           127/64 mmHg  Patient Gender: M              HR:           63 bpm.  Exam Location:  Inpatient   Procedure: Limited Echo, Limited Color Doppler and Cardiac Doppler   Indications:    Chest Pain R07.9    History:        Patient has prior history of Echocardiogram examinations,  most                 recent 12/27/2021. CAD, COPD; Risk Factors:Hypertension and                  Dyslipidemia. End stage renal disease                  1st degree HB.    Sonographer:    Ronny Flurry  Sonographer#2:  Darlina Sicilian RDCS  Referring Phys: [id number] [facility] A CHANDRASEKHAR   IMPRESSIONS     1. Left ventricular ejection fraction, by estimation, is 65 to 70%. The  left ventricle has normal function.   2. The mitral valve is grossly normal. No evidence of mitral valve  regurgitation.   3. The aortic valve is calcified. There is mild calcification of the  aortic valve. There is  mild thickening of the aortic valve. Aortic valve  regurgitation is not visualized. Aortic valve sclerosis is present, with  no evidence of aortic valve stenosis.  Aortic valve mean gradient measures 9.6 mmHg. Aortic valve Vmax measures  2.35 m/s.   FINDINGS   Left Ventricle: Left ventricular ejection fraction, by estimation, is 65  to 70%. The left ventricle has normal function.   Mitral Valve: The mitral valve is grossly normal. Mild mitral annular  calcification.   Tricuspid Valve: The tricuspid valve is grossly  normal. Tricuspid valve  regurgitation is trivial.   Aortic Valve: The aortic valve is calcified. There is mild calcification  of the aortic valve. There is mild thickening of the aortic valve. Aortic  valve regurgitation is not visualized. Aortic valve sclerosis is present,  with no evidence of aortic valve  stenosis. Aortic valve mean gradient measures 9.6 mmHg. Aortic valve peak  gradient measures 22.1 mmHg. Aortic valve area, by VTI measures 1.17 cm.   Aorta: The aortic root was not well visualized.   LEFT VENTRICLE  PLAX 2D  LVOT diam:     1.60 cm  LV SV:         48  LV SV Index:   24  LVOT Area:     2.01 cm     AORTIC VALVE  AV Area (Vmax):    1.23 cm  AV Area (Vmean):   1.61 cm  AV Area (VTI):     1.17 cm  AV Vmax:           234.83 cm/s  AV Vmean:          135.199 cm/s  AV VTI:            0.414 m  AV Peak Grad:      22.1 mmHg  AV Mean Grad:      9.6 mmHg  LVOT Vmax:         144.00 cm/s  LVOT Vmean:        108.000 cm/s  LVOT VTI:          0.240 m  LVOT/AV VTI ratio: 0.58     SHUNTS  Systemic VTI:  0.24 m  Systemic Diam: 1.60 cm   Candee Furbish MD  Electronically signed by Candee Furbish MD  Signature Date/Time: 01/19/2022/2:22:29 PM      DG Chest 2 View  Result Date: 05/30/2022 CLINICAL DATA:  Chest pain EXAM: CHEST - 2 VIEW COMPARISON:  01/18/2022 FINDINGS: The heart size and mediastinal contours are within normal limits. Atheromatous calcification both lungs are clear. The visualized skeletal structures are unremarkable. IMPRESSION: No active cardiopulmonary disease. Electronically Signed   By: Jorje Guild M.D.   On: 05/30/2022 04:18     I have independently reviewed the above radiologic studies and discussed with the patient   Recent Lab Findings: Lab Results  Component Value Date   WBC 5.3 05/31/2022   HGB 9.5 (L) 05/31/2022   HCT 28.8 (L) 05/31/2022   PLT 115 (L) 05/31/2022   GLUCOSE 101 (H) 05/31/2022   CHOL 99 11/23/2021   TRIG 148  11/23/2021   HDL 29 (L) 11/23/2021   LDLCALC 47 11/23/2021   ALT 13 05/30/2022   AST 14 (L) 05/30/2022   NA 136 05/31/2022   K 4.0 05/31/2022   CL 98 05/31/2022   CREATININE 19.27 (H) 05/31/2022   BUN 48 (H) 05/31/2022   CO2 27 05/31/2022   TSH 2.033 05/30/2022   INR 0.95 08/06/2018  HGBA1C 5.9 (H) 05/30/2022      Assessment / Plan:      -70 year old gentleman with history of hypertension, dyslipidemia, type 2 diabetes mellitus, and end-stage renal disease managed with nocturnal peritoneal dialysis and known coronary artery disease that has been treated.  This year with drug-eluting stents to the RCA.  He presents with unstable angina and mild bump in his high-sensitivity troponin on admission.  He has preserved LV function and no significant valvular.  Dr. Kipp Brood has reviewed Mr. Wanke coronary angiography and tentatively plans for two-vessel off-pump coronary bypass grafting on Monday, 06/05/2022 after sufficient time was allowed for Brilinta washout. The procedure and expected perioperative course were discussed with Mr. Armwood and questions are answered.  He would like for [place] to proceed with preoperative planning and testing in anticipation of surgery on Monday.  The echocardiogram was repeated this morning.  We will follow-up on the results.  We will also obtain carotid and lower extremity duplex studies.  I  spent 15 minutes counseling the patient face to face.   Antony Odea, PA-C  05/31/2022 10:12 AM  Agree with above 71yo male admitted for NSTEMI, and 3V CAD.  He has preserved biventricular function on echo, and no significant valvular disease.  He has a hx of ESRD, HTN, and DM.  He is on peritoneal dialysis, and likely will need a temporary catheter.  He will need a Brilinta washout prior to surgery.  Harrell Bary Leriche

## 2022-05-31 NOTE — Progress Notes (Signed)
Pt stated he has to have nasal pillows for CPAP. The hospital doesn't have nasal pillow. Encourage pt to bring home device with nasal pillow so he can wear while at the hospital.

## 2022-05-31 NOTE — Care Management (Signed)
  Transition of Care [facility]) Screening Note   Patient Details  Name: [name] [name] Date of Birth: 12/16/1950   Transition of Care [facility]) CM/SW Contact:    Bethena Roys, RN Phone Number: 05/31/2022, 2:50 PM    Transition of Care Department [facility]) has reviewed the patient and no TOC needs have been identified at this time. PTA patient reports that he was from home with spouse. Patient states he has PCP and gets to appointments without any issues. Case Manager will continue to monitor patient advancement through interdisciplinary progression rounds. If new patient transition needs arise, please place a TOC consult.

## 2022-05-31 NOTE — Progress Notes (Addendum)
Rounding Note    Patient Name: [name] [name] Date of Encounter: 05/31/2022  [facility] Cardiologist: Pixie Casino, MD   Subjective   Mild chest pain.  No dyspnea.  Inpatient Medications    Scheduled Meds:  aspirin EC  81 mg Oral Daily   calcitRIOL  0.25 mcg Oral Daily   insulin aspart  0-6 Units Subcutaneous TID WC   insulin glargine-yfgn  15 Units Subcutaneous Daily   linagliptin  5 mg Oral Daily   pantoprazole  40 mg Oral BID AC   sevelamer carbonate  1,600 mg Oral TID WC   simvastatin  20 mg Oral q morning   sodium chloride flush  3 mL Intravenous Q12H   sodium chloride flush  3 mL Intravenous Q12H   torsemide  100 mg Oral Daily   Continuous Infusions:  sodium chloride     heparin 1,100 Units/hr (05/31/22 0142)   PRN Meds: sodium chloride, acetaminophen, albuterol, morphine injection, nitroGLYCERIN, ondansetron (ZOFRAN) IV, sodium chloride flush   Vital Signs    Vitals:   05/30/22 2030 05/30/22 2251 05/31/22 0016 05/31/22 0349  BP: 138/82 96/60 120/64 120/72  Pulse: 88  86   Resp: '18  18 17  '$ Temp: (!) 97.5 F (36.4 C)  (!) 97.5 F (36.4 C) (!) 97.4 F (36.3 C)  TempSrc: Oral  Oral Oral  SpO2: 91%  99% 96%  Weight:      Height:        Intake/Output Summary (Last 24 hours) at 05/31/2022 0745 Last data filed at 05/30/2022 2057 Gross per 24 hour  Intake 240 ml  Output --  Net 240 ml      05/30/2022    3:46 AM 05/03/2022   10:21 AM 05/02/2022   11:47 AM  Last 3 Weights  Weight (lbs) 217 lb 220 lb 9.6 oz 217 lb 6.4 oz  Weight (kg) 98.431 kg 100.064 kg 98.612 kg      Telemetry    Sinus with intermittent Mobitz 1 second-degree AV block and 2-1 AV block; no prolonged pauses- Personally Reviewed   Physical Exam   GEN: No acute distress.   Neck: No JVD Cardiac: RRR, no murmurs, rubs, or gallops.  Respiratory: Clear to auscultation bilaterally. GI: Soft, nontender, non-distended, right groin with no hematoma and no bruit. MS:  No edema Neuro:  Nonfocal  Psych: Normal affect   Labs    High Sensitivity Troponin:   Recent Labs  Lab 05/30/22 0341 05/30/22 0640 05/30/22 1148  TROPONINIHS 100* 142* 330*     Chemistry Recent Labs  Lab 05/30/22 0341 05/30/22 1148 05/30/22 1507 05/30/22 1513 05/30/22 1517 05/31/22 0252  NA 136  --    < > 135 135 136  K 3.6  --    < > 3.5 3.5 4.0  CL 96*  --   --   --   --  98  CO2 26  --   --   --   --  27  GLUCOSE 128*  --   --   --   --  101*  BUN 44*  --   --   --   --  48*  CREATININE 17.29*  --   --   --   --  19.27*  CALCIUM 9.9  --   --   --   --  9.4  MG  --  2.1  --   --   --   --   PROT  --  6.4*  --   --   --   --   ALBUMIN  --  3.0*  --   --   --   --   AST  --  14*  --   --   --   --   ALT  --  13  --   --   --   --   ALKPHOS  --  68  --   --   --   --   BILITOT  --  0.3  --   --   --   --   GFRNONAA 3*  --   --   --   --  2*  ANIONGAP 14  --   --   --   --  11   < > = values in this interval not displayed.     Hematology Recent Labs  Lab 05/30/22 0341 05/30/22 1507 05/30/22 1513 05/30/22 1517 05/31/22 0252  WBC 4.4  --   --   --  5.3  RBC 2.85*  --   --   --  3.10*  HGB 8.9*   < > 8.5* 8.8* 9.5*  HCT 26.8*   < > 25.0* 26.0* 28.8*  MCV 94.0  --   --   --  92.9  MCH 31.2  --   --   --  30.6  MCHC 33.2  --   --   --  33.0  RDW 15.0  --   --   --  14.9  PLT 126*  --   --   --  115*   < > = values in this interval not displayed.   Thyroid  Recent Labs  Lab 05/30/22 1148  TSH 2.033     Radiology    CARDIAC CATHETERIZATION  Addendum Date: 05/30/2022     2nd Diag lesion is 30% stenosed.   RV Branch lesion is 90% stenosed.   RPDA lesion is 80% stenosed.   Mid RCA-1 lesion is 90% stenosed.   Mid RCA-2 lesion is 90% stenosed.   Dist RCA lesion is 75% stenosed.   Prox LAD lesion is 75% stenosed.   Mid Cx lesion is 50% stenosed.   Non-stenotic Prox RCA lesion was previously treated.   LV end diastolic pressure is mildly elevated.   There is  no aortic valve stenosis.   Aortic saturation 97%, PA saturation 65%, PA pressure 35/16, mean PA pressure 24 mmHg, mean pulmonary capillary wedge pressure 17 mmHg, right atrial pressure 4/7 mmHg, cardiac output 6.94 L/min, cardiac index 3.43. Severe in-stent restenosis in the mid RCA.  Denovo distal RCA lesion.  Back in July 2023, there was a moderate lesion but this has increased significantly. Proximal LAD lesion worsened as well compared to July 2023.  There is now an irregular 75% stenosis. Essentially normal right heart pressures. Given multiple episodes of restenosis, repeat PCI will not likely given the long-term benefit.  Now with proximal LAD lesion which has changed in the last few months, will get cardiac surgery consult.  Will hold antiplatelet therapy in the event that he is a candidate for surgery.  May need to start IV cangrelor for antiplatelet therapy.  Result Date: 05/30/2022   2nd Diag lesion is 30% stenosed.   RV Branch lesion is 90% stenosed.   RPDA lesion is 80% stenosed.   Mid RCA-1 lesion is 90% stenosed.   Mid RCA-2 lesion is 90% stenosed.   Dist RCA lesion is 75% stenosed.  Prox LAD lesion is 75% stenosed.   Mid Cx lesion is 50% stenosed.   Non-stenotic Prox RCA lesion was previously treated.   LV end diastolic pressure is mildly elevated.   There is no aortic valve stenosis.   Aortic saturation 97%, PA saturation 65%, PA pressure 35/16, mean PA pressure 24 mmHg, mean pulmonary capillary wedge pressure 17 mmHg, right atrial pressure 4/7 mmHg, cardiac output 6.94 L/min, cardiac index 3.43. Severe in-stent restenosis in the mid RCA.  Denovo distal RCA lesion.  Back in July 2023, there was a moderate lesion but this has increased significantly. Proximal LAD lesion worsened as well compared to July 2023.  There is now an irregular 75% stenosis. Essentially normal right heart pressures. Given multiple episodes of restenosis, repeat PCI will not likely given the long-term benefit.  Now with  proximal LAD lesion which has changed in the last few months, will get cardiac surgery consult.  Will hold antiplatelet therapy in the event that he is a candidate for surgery.  May need to start IV cangrelor for antiplatelet therapy.   DG Chest 2 View  Result Date: 05/30/2022 CLINICAL DATA:  Chest pain EXAM: CHEST - 2 VIEW COMPARISON:  01/18/2022 FINDINGS: The heart size and mediastinal contours are within normal limits. Atheromatous calcification both lungs are clear. The visualized skeletal structures are unremarkable. IMPRESSION: No active cardiopulmonary disease. Electronically Signed   By: Jorje Guild M.D.   On: 05/30/2022 04:18     Patient Profile     71 year old male with past medical history of diabetes mellitus, hypertension, hyperlipidemia, obstructive sleep apnea, end-stage renal disease on peritoneal dialysis, coronary artery disease with history of PCI of the right coronary artery for evaluation of unstable angina.  Cardiac catheterization revealed severe in-stent restenosis in the mid right coronary artery as well as the distal RCA lesion, 75% proximal LAD and 50% circumflex.  Assessment & Plan    1 unstable angina/NSTEMI-cardiac catheterization performed yesterday with results as above.  Patient noted to have severe in-stent restenosis in the right coronary artery and also with proximal LAD disease.  We will await surgical input for possible coronary artery bypass graft.  Continue aspirin, heparin and statin.  Patient is not on a beta-blocker given baseline bradycardia.  Add Nitropaste.  Await echocardiogram.  2 Mobitz 1 second-degree AV block-this has been noted previously and he has had no prolonged pauses.  He did have complete heart block at night and saw Dr. Caryl Comes but patient apparently was not on CPAP and has been managed conservatively.  No indication for pacemaker at present but likely will require in the future.  3 end-stage renal disease-dialysis dependent.  Will ask  nephrology to follow.  4 hyperlipidemia-continue statin.  5 obstructive sleep apnea-continue CPAP.  For questions or updates, please contact [facility] Please consult [email or web address] for contact info under        Signed, Kirk Ruths, MD  05/31/2022, 7:45 AM

## 2022-05-31 NOTE — Progress Notes (Signed)
Lab tried twice this afternoon to get a blood draw for the heparin level but was unsuccessful both times. Pt refused any more attempts. MD and pharmacy made aware. Lab going to re-attempt blood draw after their shift change this evening.

## 2022-05-31 NOTE — Consult Note (Addendum)
[facility] Nephrology Consult Note: Reason for Consult: To manage dialysis and dialysis related needs Referring Physician: Dr. Florene Glen, A caldwell  HPI:  [name] [name] is an 71 y.o. male with past medical history significant for OSA, COPD, type II DM, CAD, ESRD on PD, anemia who was presented with chest pain, and NSTEMI, seen as a consultation for the management of ESRD. The patient had PCI of the right coronary artery in the past.  He underwent cardiac cath on 11/14 with severe in-stent restenosis in the mid RCA, 75% proximal LAD and 50% circumflex.  The CT surgery was consulted for possible CABG.  He is currently on IV heparin. The patient said he has been on PD for about 4 years and [facility].  The cause of ESRD was diabetic nephropathy.  He does 5 exchanges including 1 day dwell.  Patient said he uses 2.5 L of fill volume mix of 1.5 and 2.5% dextrose solution.  He does have left upper extremity AV fistula which was never used before. Currently he denies fever, chills, nausea, vomiting, shortness of breath.  Chest pain is intermittent.  Dialysis Orders from previous admission in 01/2022.: [facility] therapies.  CCPD, 5 exchanges 3 liter fill volumes, 10 min fill time, 1 hour 20 minutes dwell, 20 minute drain.  Last fill Icodextran.  EDW 103 kg. Uses combination of 1.5% and 2.5% depending upon BP and weight   Past Medical History:  Diagnosis Date   Anemia    Arthritis    Asthma    Bell palsy    CAD (coronary artery disease)    a. 2014 MV: abnl w/ infap ischemia; b. 03/2013 Cath: aneurysmal bleb in the LAD w/ otw nonobs dzs-->Med Rx.   Chronic back pain    Chronic knee pain    a. 09/2015 s/p R TKA.   Chronic pain    Chronic shoulder pain    Chronic sinusitis    COPD (chronic obstructive pulmonary disease) ([place])    Diabetes mellitus without complication ([place])    type II    ESRD on peritoneal dialysis ([place])    on peritoneal dialysis, [facility]    Essential hypertension    GERD (gastroesophageal reflux disease)    Gout    Gout    Hepatomegaly    noted on noncontrast CT 2015   History of hiatal hernia    Hyperlipidemia    Lateral meniscus tear    Obesity    Truncal   Obstructive sleep apnea    does not use cpap    On home oxygen therapy    uses 2l when is going somewhere per patient    PUD (peptic ulcer disease)    remote, reports f/u EGD about 8 years ago unremarkable    Reactive airway disease    related to exposure to chemical during 9/11   Sinusitis    Vitamin D deficiency     Past Surgical History:  Procedure Laterality Date   ASAD LT SHOULDER  12/15/2008   left shoulder   AV FISTULA PLACEMENT Left 08/09/2016   Procedure: BRACHIOCEPHALIC ARTERIOVENOUS (AV) FISTULA CREATION LEFT ARM;  Surgeon: [name], MD;  Location: [facility];  Service: Vascular;  Laterality: Left;   CAPD INSERTION N/A 10/07/2018   Procedure: LAPAROSCOPIC PERITONEAL CATHETER PLACEMENT;  Surgeon: Mickeal Skinner, MD;  Location: [facility];  Service: General;  Laterality: N/A;   CATARACT EXTRACTION W/PHACO Left 03/28/2016   Procedure: CATARACT EXTRACTION PHACO AND INTRAOCULAR LENS  PLACEMENT LEFT EYE;  Surgeon: [name], MD;  Location: [facility];  Service: Ophthalmology;  Laterality: Left;  CDE: 4.77   CATARACT EXTRACTION W/PHACO Right 04/11/2016   Procedure: CATARACT EXTRACTION PHACO AND INTRAOCULAR LENS PLACEMENT RIGHT EYE; CDE:  4.74;  Surgeon: [name], MD;  Location: [facility];  Service: Ophthalmology;  Laterality: Right;   COLONOSCOPY  10/15/2008   Fields: Rectal polyp obliterated, not retrieved, hemorrhoids, single ascending colon diverticulum near the CV. Next colonoscopy April 2020   COLONOSCOPY N/A 12/25/2014   SLF: 1. Colorectal polyps (2) removed 2. Small internal hemorrhoids 3. the left colon is severely redundant. hyperplastic polyps   CORONARY STENT INTERVENTION N/A 07/25/2021   Procedure: CORONARY STENT INTERVENTION;  Surgeon:  Sherren Mocha, MD;  Location: [facility];  Service: Cardiovascular;  Laterality: N/A;   CORONARY STENT INTERVENTION N/A 12/26/2021   Procedure: CORONARY STENT INTERVENTION;  Surgeon: Nelva Bush, MD;  Location: [facility];  Service: Cardiovascular;  Laterality: N/A;   CORONARY STENT INTERVENTION N/A 01/20/2022   Procedure: CORONARY STENT INTERVENTION;  Surgeon: Sherren Mocha, MD;  Location: [facility];  Service: Cardiovascular;  Laterality: N/A;   DOPPLER ECHOCARDIOGRAPHY     ESOPHAGOGASTRODUODENOSCOPY N/A 12/25/2014   SLF: 1. Anemia most likely due to CRI, gastritis, gastric polyps 2. Moderate non-erosive gastriits and mild duodenitis.  3.TWo large gstric polyps removed.    EYE SURGERY  12/22/2010   tear duct probing-[place]   FOREIGN BODY REMOVAL  03/29/2011   Procedure: REMOVAL FOREIGN BODY EXTREMITY;  Surgeon: Arther Abbott, MD;  Location[facility];  Service: Orthopedics;  Laterality: Right;  Removal Foreign Body Right Thumb   IR FLUORO GUIDE CV LINE RIGHT  08/06/2018   IR US GUIDE VASC ACCESS RIGHT  08/06/2018   KNEE ARTHROSCOPY  10/16/2007   left   KNEE ARTHROSCOPY WITH LATERAL MENISECTOMY Right 10/14/2015   Procedure: LEFT KNEE ARTHROSCOPY WITH PARTIAL LATERAL MENISECTOMY;  Surgeon: Carole Civil, MD;  [facility];  Service: Orthopedics;  Laterality: Right;   LEFT HEART CATH AND CORONARY ANGIOGRAPHY N/A 07/25/2021   Procedure: LEFT HEART CATH AND CORONARY ANGIOGRAPHY;  Surgeon: Sherren Mocha, MD;  Location: [place] [facility];  Service: Cardiovascular;  Laterality: N/A;   LEFT HEART CATH AND CORONARY ANGIOGRAPHY N/A 12/26/2021   Procedure: LEFT HEART CATH AND CORONARY ANGIOGRAPHY;  Surgeon: Nelva Bush, MD;  Location: [facility];  Service: Cardiovascular;  Laterality: N/A;   LEFT HEART CATH AND CORONARY ANGIOGRAPHY N/A 01/20/2022   Procedure: LEFT HEART CATH AND CORONARY ANGIOGRAPHY;  Surgeon: Sherren Mocha, MD;  Location: [facility];  Service: Cardiovascular;  Laterality: N/A;   LEFT HEART CATHETERIZATION WITH CORONARY ANGIOGRAM N/A 03/28/2013   Procedure: LEFT HEART CATHETERIZATION WITH CORONARY ANGIOGRAM;  Surgeon: Leonie Man, MD;  Location: [facility] C[facility];  Service: Cardiovascular;  Laterality: N/A;   NM MYOVIEW LTD     PENILE PROSTHESIS IMPLANT N/A 08/16/2015   Procedure: PENILE PROTHESIS INFLATABLE, three piece, Excisional biopsy of Penile ulcer, Penile molding;  Surgeon: Carolan Clines, MD;  [facility];  Service: Urology;  Laterality: N/A;   PENILE PROSTHESIS IMPLANT N/A 12/24/2017   Procedure: REMOVAL AND  REPLACEMENT  COLOPLAST PENILE PROSTHESIS;  Surgeon: Lucas Mallow, MD;  [facility];  Service: Urology;  Laterality: N/A;   QUADRICEPS TENDON REPAIR  07/21/2011   Procedure: REPAIR QUADRICEP TENDON;  Surgeon: Arther Abbott, MD;  [facility];  Service: Orthopedics;  Laterality: Right;  RIGHT/LEFT HEART CATH AND CORONARY ANGIOGRAPHY N/A 05/30/2022   Procedure: RIGHT/LEFT HEART CATH AND CORONARY ANGIOGRAPHY;  Surgeon: Jettie Booze, MD;  Location: [facility];  Service: Cardiovascular;  Laterality: N/A;   TOENAIL EXCISION     removed [id number]   UMBILICAL HERNIA REPAIR  07/17/2005   [place]    Family History  Problem Relation Age of Onset   Hypertension Mother        MI   Cancer Mother        breast    Diabetes Mother    Diabetes Father    Hypertension Father    Hypertension Sister    Diabetes Sister    Arthritis Other    Asthma Other    Lung disease Other    Anesthesia problems Neg Hx    Hypotension Neg Hx    Malignant hyperthermia Neg Hx    Pseudochol deficiency Neg Hx    Colon cancer Neg Hx     Social History:  reports that he quit smoking about 12 years ago. His smoking use included cigarettes. He has a 25.00 pack-year smoking history. He has never used smokeless tobacco. He reports that he does not currently use alcohol. He reports  current drug use. Drug: Marijuana.  Allergies:  Allergies  Allergen Reactions   Opana [Oxymorphone Hcl] Itching   Tramadol Itching    Medications: I have reviewed the patient's current medications.   Results for orders placed or performed during the hospital encounter of 05/30/22 (from the past 48 hour(s))  Basic metabolic panel     Status: Abnormal   Collection Time: 05/30/22  3:41 AM  Result Value Ref Range   Sodium 136 135 - 145 mmol/L   Potassium 3.6 3.5 - 5.1 mmol/L   Chloride 96 (L) 98 - 111 mmol/L   CO2 26 22 - 32 mmol/L   Glucose, Bld 128 (H) 70 - 99 mg/dL    Comment: Glucose reference range applies only to samples taken after fasting for at least 8 hours.   BUN 44 (H) 8 - 23 mg/dL   Creatinine, Ser 17.29 (H) 0.61 - 1.24 mg/dL   Calcium 9.9 8.9 - 10.3 mg/dL   GFR, Estimated 3 (L) >60 mL/min    Comment: (NOTE) Calculated using the CKD-EPI Creatinine Equation (2021)    Anion gap 14 5 - 15    Comment: Performed at [facility] [address], [place], [place] [address]  Troponin I (High Sensitivity)     Status: Abnormal   Collection Time: 05/30/22  3:41 AM  Result Value Ref Range   Troponin I (High Sensitivity) 100 (HH) <18 ng/L    Comment: CRITICAL RESULT CALLED TO, READ BACK BY AND VERIFIED WITH CRYSTAL BLACK RN 05/30/22 0441 M KOROLESKI (NOTE) Elevated high sensitivity troponin I (hsTnI) values and significant  changes across serial measurements may suggest ACS but many other  chronic and acute conditions are known to elevate hsTnI results.  Refer to the "Links" section for chest pain algorithms and additional  guidance. Performed at [facility], [place] [address] [place], [place] [address]   CBC with Differential/Platelet     Status: Abnormal   Collection Time: 05/30/22  3:41 AM  Result Value Ref Range   WBC 4.4 4.0 - 10.5 K/uL   RBC 2.85 (L) 4.22 - 5.81 MIL/uL   Hemoglobin 8.9 (L) 13.0 - 17.0 g/dL   HCT 26.8 (L) 39.0 - 52.0 %   MCV 94.0 80.0 -  100.0 fL   MCH 31.2 26.0 - 34.0 pg   MCHC 33.2 30.0 - 36.0 g/dL   RDW 15.0 11.5 - 15.5 %   Platelets 126 (L) 150 - 400 K/uL   nRBC 0.0 0.0 - 0.2 %   Neutrophils Relative % 75 %   Neutro Abs 3.3 1.7 - 7.7 K/uL   Lymphocytes Relative 11 %   Lymphs Abs 0.5 (L) 0.7 - 4.0 K/uL   Monocytes Relative 9 %   Monocytes Absolute 0.4 0.1 - 1.0 K/uL   Eosinophils Relative 3 %   Eosinophils Absolute 0.1 0.0 - 0.5 K/uL   Basophils Relative 1 %   Basophils Absolute 0.0 0.0 - 0.1 K/uL   Immature Granulocytes 1 %   Abs Immature Granulocytes 0.02 0.00 - 0.07 K/uL    Comment: Performed at [facility] [address], [place], [place] [address]  Troponin I (High Sensitivity)     Status: Abnormal   Collection Time: 05/30/22  6:40 AM  Result Value Ref Range   Troponin I (High Sensitivity) 142 (HH) <18 ng/L    Comment: CRITICAL VALUE NOTED. VALUE IS CONSISTENT WITH PREVIOUSLY REPORTED/CALLED VALUE (NOTE) Elevated high sensitivity troponin I (hsTnI) values and significant  changes across serial measurements may suggest ACS but many other  chronic and acute conditions are known to elevate hsTnI results.  Refer to the "Links" section for chest pain algorithms and additional  guidance. Performed at [facility], [place] [address] [place], [place] [address]   CBG monitoring, ED     Status: None   Collection Time: 05/30/22 11:46 AM  Result Value Ref Range   Glucose-Capillary 98 70 - 99 mg/dL    Comment: Glucose reference range applies only to samples taken after fasting for at least 8 hours.  Troponin I (High Sensitivity)     Status: Abnormal   Collection Time: 05/30/22 11:48 AM  Result Value Ref Range   Troponin I (High Sensitivity) 330 (HH) <18 ng/L    Comment: CRITICAL VALUE NOTED. VALUE IS CONSISTENT WITH PREVIOUSLY REPORTED/CALLED VALUE (NOTE) Elevated high sensitivity troponin I (hsTnI) values and significant  changes across serial measurements may suggest ACS but many other  chronic  and acute conditions are known to elevate hsTnI results.  Refer to the Links section for chest pain algorithms and additional  guidance. Performed at [facility], [place] [address], [place], [place] [address]   Magnesium     Status: None   Collection Time: 05/30/22 11:48 AM  Result Value Ref Range   Magnesium 2.1 1.7 - 2.4 mg/dL    Comment: Performed at [facility] [address] [place], [place] [address]  Hemoglobin A1c     Status: Abnormal   Collection Time: 05/30/22 11:48 AM  Result Value Ref Range   Hgb A1c MFr Bld 5.9 (H) 4.8 - 5.6 %    Comment: (NOTE) Pre diabetes:          5.7%-6.4%  Diabetes:              >6.4%  Glycemic control for   <7.0% adults with diabetes    Mean Plasma Glucose 122.63 mg/dL    Comment: Performed at [facility] [address] [place], [place] [address]  TSH     Status: None   Collection Time: 05/30/22 11:48 AM  Result Value Ref Range   TSH 2.033 0.350 - 4.500 uIU/mL    Comment: Performed by a 3rd  Generation assay with a functional sensitivity of <=0.01 uIU/mL. Performed at [facility], [place] [address] [place], [place] [address]   Hepatic function panel     Status: Abnormal   Collection Time: 05/30/22 11:48 AM  Result Value Ref Range   Total Protein 6.4 (L) 6.5 - 8.1 g/dL   Albumin 3.0 (L) 3.5 - 5.0 g/dL   AST 14 (L) 15 - 41 U/L   ALT 13 0 - 44 U/L   Alkaline Phosphatase 68 38 - 126 U/L   Total Bilirubin 0.3 0.3 - 1.2 mg/dL   Bilirubin, Direct <0.1 0.0 - 0.2 mg/dL   Indirect Bilirubin NOT CALCULATED 0.3 - 0.9 mg/dL    Comment: Performed at [facility] [address] [place], [place] [address]  POCT I-Stat EG7     Status: Abnormal   Collection Time: 05/30/22  3:07 PM  Result Value Ref Range   pH, Ven 7.362 7.25 - 7.43   pCO2, Ven 47.0 44 - 60 mmHg   pO2, Ven 91 (H) 32 - 45 mmHg   Bicarbonate 26.7 20.0 - 28.0 mmol/L   TCO2 28 22 - 32 mmol/L   O2 Saturation 97 %   Acid-Base Excess 1.0 0.0 - 2.0  mmol/L   Sodium 135 135 - 145 mmol/L   Potassium 3.5 3.5 - 5.1 mmol/L   Calcium, Ion 1.27 1.15 - 1.40 mmol/L   HCT 25.0 (L) 39.0 - 52.0 %   Hemoglobin 8.5 (L) 13.0 - 17.0 g/dL   Sample type VENOUS   POCT I-Stat EG7     Status: Abnormal   Collection Time: 05/30/22  3:13 PM  Result Value Ref Range   pH, Ven 7.321 7.25 - 7.43   pCO2, Ven 55.0 44 - 60 mmHg   pO2, Ven 36 32 - 45 mmHg   Bicarbonate 28.4 (H) 20.0 - 28.0 mmol/L   TCO2 30 22 - 32 mmol/L   O2 Saturation 63 %   Acid-Base Excess 2.0 0.0 - 2.0 mmol/L   Sodium 135 135 - 145 mmol/L   Potassium 3.5 3.5 - 5.1 mmol/L   Calcium, Ion 1.32 1.15 - 1.40 mmol/L   HCT 25.0 (L) 39.0 - 52.0 %   Hemoglobin 8.5 (L) 13.0 - 17.0 g/dL   Sample type VENOUS    Comment NOTIFIED PHYSICIAN   POCT I-Stat EG7     Status: Abnormal   Collection Time: 05/30/22  3:17 PM  Result Value Ref Range   pH, Ven 7.334 7.25 - 7.43   pCO2, Ven 53.3 44 - 60 mmHg   pO2, Ven 36 32 - 45 mmHg   Bicarbonate 28.3 (H) 20.0 - 28.0 mmol/L   TCO2 30 22 - 32 mmol/L   O2 Saturation 65 %   Acid-Base Excess 2.0 0.0 - 2.0 mmol/L   Sodium 135 135 - 145 mmol/L   Potassium 3.5 3.5 - 5.1 mmol/L   Calcium, Ion 1.29 1.15 - 1.40 mmol/L   HCT 26.0 (L) 39.0 - 52.0 %   Hemoglobin 8.8 (L) 13.0 - 17.0 g/dL   Sample type VENOUS    Comment NOTIFIED PHYSICIAN   Glucose, capillary     Status: None   Collection Time: 05/30/22  3:57 PM  Result Value Ref Range   Glucose-Capillary 88 70 - 99 mg/dL    Comment: Glucose reference range applies only to samples taken after fasting for at least 8 hours.  Glucose, capillary     Status: None   Collection Time:  05/30/22 10:53 PM  Result Value Ref Range   Glucose-Capillary 91 70 - 99 mg/dL    Comment: Glucose reference range applies only to samples taken after fasting for at least 8 hours.  CBC     Status: Abnormal   Collection Time: 05/31/22  2:52 AM  Result Value Ref Range   WBC 5.3 4.0 - 10.5 K/uL   RBC 3.10 (L) 4.22 - 5.81 MIL/uL    Hemoglobin 9.5 (L) 13.0 - 17.0 g/dL   HCT 28.8 (L) 39.0 - 52.0 %   MCV 92.9 80.0 - 100.0 fL   MCH 30.6 26.0 - 34.0 pg   MCHC 33.0 30.0 - 36.0 g/dL   RDW 14.9 11.5 - 15.5 %   Platelets 115 (L) 150 - 400 K/uL    Comment: REPEATED TO VERIFY   nRBC 0.0 0.0 - 0.2 %    Comment: Performed at [facility], [place] [address] [place], [place] [address]  Basic metabolic panel     Status: Abnormal   Collection Time: 05/31/22  2:52 AM  Result Value Ref Range   Sodium 136 135 - 145 mmol/L   Potassium 4.0 3.5 - 5.1 mmol/L   Chloride 98 98 - 111 mmol/L   CO2 27 22 - 32 mmol/L   Glucose, Bld 101 (H) 70 - 99 mg/dL    Comment: Glucose reference range applies only to samples taken after fasting for at least 8 hours.   BUN 48 (H) 8 - 23 mg/dL   Creatinine, Ser 19.27 (H) 0.61 - 1.24 mg/dL   Calcium 9.4 8.9 - 10.3 mg/dL   GFR, Estimated 2 (L) >60 mL/min    Comment: (NOTE) Calculated using the CKD-EPI Creatinine Equation (2021)    Anion gap 11 5 - 15    Comment: Performed at [facility] [address], [place], [place] [address]  Glucose, capillary     Status: None   Collection Time: 05/31/22  8:03 AM  Result Value Ref Range   Glucose-Capillary 99 70 - 99 mg/dL    Comment: Glucose reference range applies only to samples taken after fasting for at least 8 hours.    CARDIAC CATHETERIZATION  Addendum Date: 05/30/2022     2nd Diag lesion is 30% stenosed.   RV Branch lesion is 90% stenosed.   RPDA lesion is 80% stenosed.   Mid RCA-1 lesion is 90% stenosed.   Mid RCA-2 lesion is 90% stenosed.   Dist RCA lesion is 75% stenosed.   Prox LAD lesion is 75% stenosed.   Mid Cx lesion is 50% stenosed.   Non-stenotic Prox RCA lesion was previously treated.   LV end diastolic pressure is mildly elevated.   There is no aortic valve stenosis.   Aortic saturation 97%, PA saturation 65%, PA pressure 35/16, mean PA pressure 24 mmHg, mean pulmonary capillary wedge pressure 17 mmHg, right atrial pressure 4/7  mmHg, cardiac output 6.94 L/min, cardiac index 3.43. Severe in-stent restenosis in the mid RCA.  Denovo distal RCA lesion.  Back in July 2023, there was a moderate lesion but this has increased significantly. Proximal LAD lesion worsened as well compared to July 2023.  There is now an irregular 75% stenosis. Essentially normal right heart pressures. Given multiple episodes of restenosis, repeat PCI will not likely given the long-term benefit.  Now with proximal LAD lesion which has changed in the last few months, will get cardiac surgery consult.  Will hold antiplatelet therapy in the event that he  is a candidate for surgery.  May need to start IV cangrelor for antiplatelet therapy.  Result Date: 05/30/2022   2nd Diag lesion is 30% stenosed.   RV Branch lesion is 90% stenosed.   RPDA lesion is 80% stenosed.   Mid RCA-1 lesion is 90% stenosed.   Mid RCA-2 lesion is 90% stenosed.   Dist RCA lesion is 75% stenosed.   Prox LAD lesion is 75% stenosed.   Mid Cx lesion is 50% stenosed.   Non-stenotic Prox RCA lesion was previously treated.   LV end diastolic pressure is mildly elevated.   There is no aortic valve stenosis.   Aortic saturation 97%, PA saturation 65%, PA pressure 35/16, mean PA pressure 24 mmHg, mean pulmonary capillary wedge pressure 17 mmHg, right atrial pressure 4/7 mmHg, cardiac output 6.94 L/min, cardiac index 3.43. Severe in-stent restenosis in the mid RCA.  Denovo distal RCA lesion.  Back in July 2023, there was a moderate lesion but this has increased significantly. Proximal LAD lesion worsened as well compared to July 2023.  There is now an irregular 75% stenosis. Essentially normal right heart pressures. Given multiple episodes of restenosis, repeat PCI will not likely given the long-term benefit.  Now with proximal LAD lesion which has changed in the last few months, will get cardiac surgery consult.  Will hold antiplatelet therapy in the event that he is a candidate for surgery.  May need to  start IV cangrelor for antiplatelet therapy.   DG Chest 2 View  Result Date: 05/30/2022 CLINICAL DATA:  Chest pain EXAM: CHEST - 2 VIEW COMPARISON:  01/18/2022 FINDINGS: The heart size and mediastinal contours are within normal limits. Atheromatous calcification both lungs are clear. The visualized skeletal structures are unremarkable. IMPRESSION: No active cardiopulmonary disease. Electronically Signed   By: Jorje Guild M.D.   On: 05/30/2022 04:18    ROS: As per H&P.  Rest of the systems reviewed and negative.  Blood pressure 120/72, pulse 86, temperature (!) 97.4 F (36.3 C), temperature source Oral, resp. rate 17, height '5\' 4"'$  (1.626 m), weight 98.4 kg, SpO2 96 %. Gen: NAD, comfortable Respiratory: Clear bilateral, no wheezing or crackle Cardiovascular: Regular rate rhythm S1-S2 normal, no rubs GI: Abdomen soft, nontender, nondistended Extremities, no cyanosis or clubbing, no edema Skin: No rash or ulcer Neurology: Alert, awake, following commands, oriented Dialysis Access:  Assessment/Plan:  #NSTEMI/unstable angina: Cardiac cath on 11/14 with severe in in-stent restenosis in the right coronary artery and proximal LAD disease.  Currently on heparin and cardiac medication.  CT surgery was consulted for possible CABG, possibly surgery on Monday.  Please avoid morphine for the pain medication.  I will let team know.  # ESRD on PD: Resume CCPD at night, 5 exchanges, 2.5 L fill volume, mix of 1.5 and 2.5% solution.  He does have left upper extremity AV fistula.  We will try to obtain outpatient record from outpatient unit.  # Hypertension: Blood pressure acceptable.  No edema.  Continue current cardiac medication.  # Anemia of ESRD: Hemoglobin 9.5.  We will need to obtain records from outpatient treatment and medication.  May need ESA.  Check iron in the morning.  #CKD-MBD: Check phosphorus level.  Currently on sevelamer and calcitriol. Discussed with the dialysis nurse.  Thank you  for the consult, we will continue to follow.  Stevee Valenta [name] 05/31/2022, 11:48 AM

## 2022-05-31 NOTE — Progress Notes (Signed)
Spoke with Dr. Jonnie Finner who will see the patient and dialyze him tonight.   CVTS on board, per CVTS, Dr. Kipp Brood will tentatively do his procedure on Monday after Brilinta washout.

## 2022-06-01 ENCOUNTER — Inpatient Hospital Stay (HOSPITAL_COMMUNITY): Payer: Medicare Other

## 2022-06-01 DIAGNOSIS — Z01810 Encounter for preprocedural cardiovascular examination: Secondary | ICD-10-CM

## 2022-06-01 DIAGNOSIS — I214 Non-ST elevation (NSTEMI) myocardial infarction: Secondary | ICD-10-CM | POA: Diagnosis not present

## 2022-06-01 LAB — COMPREHENSIVE METABOLIC PANEL
ALT: 13 U/L (ref 0–44)
AST: 32 U/L (ref 15–41)
Albumin: 2.6 g/dL — ABNORMAL LOW (ref 3.5–5.0)
Alkaline Phosphatase: 46 U/L (ref 38–126)
Anion gap: 14 (ref 5–15)
BUN: 50 mg/dL — ABNORMAL HIGH (ref 8–23)
CO2: 26 mmol/L (ref 22–32)
Calcium: 9.3 mg/dL (ref 8.9–10.3)
Chloride: 96 mmol/L — ABNORMAL LOW (ref 98–111)
Creatinine, Ser: 19.46 mg/dL — ABNORMAL HIGH (ref 0.61–1.24)
GFR, Estimated: 2 mL/min — ABNORMAL LOW (ref >60)
Glucose, Bld: 121 mg/dL — ABNORMAL HIGH (ref 70–99)
Potassium: 3.9 mmol/L (ref 3.5–5.1)
Sodium: 136 mmol/L (ref 135–145)
Total Bilirubin: 0.4 mg/dL (ref 0.3–1.2)
Total Protein: 5.8 g/dL — ABNORMAL LOW (ref 6.5–8.1)

## 2022-06-01 LAB — CBC WITH DIFFERENTIAL/PLATELET
Abs Immature Granulocytes: 0.02 10*3/uL (ref 0.00–0.07)
Basophils Absolute: 0 10*3/uL (ref 0.0–0.1)
Basophils Relative: 1 %
Eosinophils Absolute: 0.2 10*3/uL (ref 0.0–0.5)
Eosinophils Relative: 3 %
HCT: 27.8 % — ABNORMAL LOW (ref 39.0–52.0)
Hemoglobin: 9 g/dL — ABNORMAL LOW (ref 13.0–17.0)
Immature Granulocytes: 0 %
Lymphocytes Relative: 8 %
Lymphs Abs: 0.4 10*3/uL — ABNORMAL LOW (ref 0.7–4.0)
MCH: 30.2 pg (ref 26.0–34.0)
MCHC: 32.4 g/dL (ref 30.0–36.0)
MCV: 93.3 fL (ref 80.0–100.0)
Monocytes Absolute: 0.6 10*3/uL (ref 0.1–1.0)
Monocytes Relative: 10 %
Neutro Abs: 4.4 10*3/uL (ref 1.7–7.7)
Neutrophils Relative %: 78 %
Platelets: 105 10*3/uL — ABNORMAL LOW (ref 150–400)
RBC: 2.98 MIL/uL — ABNORMAL LOW (ref 4.22–5.81)
RDW: 14.9 % (ref 11.5–15.5)
WBC: 5.6 10*3/uL (ref 4.0–10.5)
nRBC: 0 % (ref 0.0–0.2)

## 2022-06-01 LAB — PHOSPHORUS: Phosphorus: 3.3 mg/dL (ref 2.5–4.6)

## 2022-06-01 LAB — GLUCOSE, CAPILLARY
Glucose-Capillary: 102 mg/dL — ABNORMAL HIGH (ref 70–99)
Glucose-Capillary: 90 mg/dL (ref 70–99)
Glucose-Capillary: 130 mg/dL — ABNORMAL HIGH (ref 70–99)
Glucose-Capillary: 80 mg/dL (ref 70–99)
Glucose-Capillary: 129 mg/dL — ABNORMAL HIGH (ref 70–99)

## 2022-06-01 LAB — HEPARIN LEVEL (UNFRACTIONATED): Heparin Unfractionated: 0.2 IU/mL — ABNORMAL LOW (ref 0.30–0.70)

## 2022-06-01 LAB — MAGNESIUM: Magnesium: 2 mg/dL (ref 1.7–2.4)

## 2022-06-01 MED ORDER — POLYETHYLENE GLYCOL 3350 17 G PO PACK
17.0000 g | PACK | Freq: Two times a day (BID) | ORAL | Status: DC
  Administered 2022-06-01 – 2022-06-02 (×2): 17 g via ORAL
  Filled 2022-06-01 (×5): qty 1

## 2022-06-01 NOTE — Plan of Care (Signed)

## 2022-06-01 NOTE — Progress Notes (Signed)
Pre cabg has been completed.   Preliminary results in CV Proc.   [name] [name] [name] [name] 06/01/2022 11:23 AM

## 2022-06-01 NOTE — Progress Notes (Signed)
Rounding Note    Patient Name: MAEJOR ERVEN Date of Encounter: 06/01/2022  [employer] Cardiologist: Pixie Casino, MD   Subjective   No CP or dyspnea  Inpatient Medications    Scheduled Meds:  aspirin EC  81 mg Oral Daily   calcitRIOL  0.25 mcg Oral Daily   gentamicin cream  1 Application Topical Daily   insulin aspart  0-6 Units Subcutaneous TID WC   insulin glargine-yfgn  15 Units Subcutaneous Daily   linagliptin  5 mg Oral Daily   nitroGLYCERIN  1 inch Topical Q6H   pantoprazole  40 mg Oral BID AC   sevelamer carbonate  1,600 mg Oral TID WC   simvastatin  20 mg Oral q morning   sodium chloride flush  3 mL Intravenous Q12H   torsemide  100 mg Oral Daily   Continuous Infusions:  dialysis solution 1.5% low-MG/low-CA     dialysis solution 2.5% low-MG/low-CA     heparin Stopped (05/31/22 2347)   PRN Meds: acetaminophen, albuterol, morphine injection, nitroGLYCERIN, ondansetron (ZOFRAN) IV, sodium chloride flush   Vital Signs    Vitals:   05/30/22 2251 05/31/22 0016 05/31/22 0349 05/31/22 2016  BP: 96/60 120/64 120/72 (!) 145/75  Pulse:  86  60  Resp:  '18 17 20  '$ Temp:  (!) 97.5 F (36.4 C) (!) 97.4 F (36.3 C) (!) 97.3 F (36.3 C)  TempSrc:  Oral Oral Oral  SpO2:  99% 96% 98%  Weight:      Height:        Intake/Output Summary (Last 24 hours) at 06/01/2022 0753 Last data filed at 06/01/2022 0307 Gross per 24 hour  Intake 521.86 ml  Output --  Net 521.86 ml       05/30/2022    3:46 AM 05/03/2022   10:21 AM 05/02/2022   11:47 AM  Last 3 Weights  Weight (lbs) 217 lb 220 lb 9.6 oz 217 lb 6.4 oz  Weight (kg) 98.431 kg 100.064 kg 98.612 kg      Telemetry    Sinus with intermittent Mobitz 1 second-degree AV block and 2-1 AV block; no prolonged pauses- Personally Reviewed   Physical Exam   GEN: NAD Neck: supple Cardiac: irregular and bradycardic Respiratory: CTA GI: Soft, NT/ND MS: No edema Neuro:  Grossly intact Psych:  Normal affect   Labs    High Sensitivity Troponin:   Recent Labs  Lab 05/30/22 0341 05/30/22 0640 05/30/22 1148  TROPONINIHS 100* 142* 330*      Chemistry Recent Labs  Lab 05/30/22 0341 05/30/22 1148 05/30/22 1507 05/30/22 1517 05/31/22 0252 06/01/22 0212  NA 136  --    < > 135 136 136  K 3.6  --    < > 3.5 4.0 3.9  CL 96*  --   --   --  98 96*  CO2 26  --   --   --  27 26  GLUCOSE 128*  --   --   --  101* 121*  BUN 44*  --   --   --  48* 50*  CREATININE 17.29*  --   --   --  19.27* 19.46*  CALCIUM 9.9  --   --   --  9.4 9.3  MG  --  2.1  --   --   --  2.0  PROT  --  6.4*  --   --   --  5.8*  ALBUMIN  --  3.0*  --   --   --  2.6*  AST  --  14*  --   --   --  32  ALT  --  13  --   --   --  13  ALKPHOS  --  68  --   --   --  46  BILITOT  --  0.3  --   --   --  0.4  GFRNONAA 3*  --   --   --  2* 2*  ANIONGAP 14  --   --   --  11 14   < > = values in this interval not displayed.      Hematology Recent Labs  Lab 05/30/22 0341 05/30/22 1507 05/30/22 1517 05/31/22 0252 06/01/22 0212  WBC 4.4  --   --  5.3 5.6  RBC 2.85*  --   --  3.10* 2.98*  HGB 8.9*   < > 8.8* 9.5* 9.0*  HCT 26.8*   < > 26.0* 28.8* 27.8*  MCV 94.0  --   --  92.9 93.3  MCH 31.2  --   --  30.6 30.2  MCHC 33.2  --   --  33.0 32.4  RDW 15.0  --   --  14.9 14.9  PLT 126*  --   --  115* 105*   < > = values in this interval not displayed.    Thyroid  Recent Labs  Lab 05/30/22 1148  TSH 2.033      Radiology    ECHOCARDIOGRAM COMPLETE  Result Date: 05/31/2022    ECHOCARDIOGRAM REPORT   Patient Name:   LUNDON VERDEJO Date of Exam: 05/31/2022 Medical Rec #:  [id number]      Height:       64.0 in Accession #:    [id number]     Weight:       217.0 lb Date of Birth:  Sep 03, 1950     BSA:          2.025 m Patient Age:    71 years       BP:           120/72 mmHg Patient Gender: M              HR:           49 bpm. Exam Location:  Inpatient Procedure: 2D Echo, Cardiac Doppler, Color Doppler and  Intracardiac            Opacification Agent Indications:    NSTEMI I21..4  History:        Patient has prior history of Echocardiogram examinations, most                 recent 01/19/2022. CAD, Previous Myocardial Infarction and Angina,                 Signs/Symptoms:Chest Pain; Risk Factors:Hypertension, Sleep                 Apnea, Diabetes and Dyslipidemia.  Sonographer:    Ronny Flurry Sonographer#2:  Clayton Lefort RDCS (AE) Referring Phys: [id number] Jettie Booze  Sonographer Comments: Image acquisition challenging due to respiratory motion. IMPRESSIONS  1. Left ventricular ejection fraction, by estimation, is 50 to 55%. The left ventricle has low normal function. The left ventricle demonstrates regional wall motion abnormalities (see scoring diagram/findings for description). There is mild left ventricular hypertrophy. Left ventricular diastolic parameters are indeterminate.  2. Right ventricular systolic function is normal. The right ventricular size is normal.  3. The mitral  valve is normal in structure. Mild mitral valve regurgitation. No evidence of mitral stenosis.  4. The aortic valve is calcified. There is mild calcification of the aortic valve. Aortic valve regurgitation is mild. Aortic valve sclerosis is present, with no evidence of aortic valve stenosis. Aortic valve mean gradient measures 9.2 mmHg. Aortic valve Vmax measures 2.16 m/s.  5. The inferior vena cava is normal in size with greater than 50% respiratory variability, suggesting right atrial pressure of 3 mmHg. FINDINGS  Left Ventricle: Left ventricular ejection fraction, by estimation, is 50 to 55%. The left ventricle has low normal function. The left ventricle demonstrates regional wall motion abnormalities. Definity contrast agent was given IV to delineate the left ventricular endocardial borders. The left ventricular internal cavity size was normal in size. There is mild left ventricular hypertrophy. Left ventricular diastolic parameters  are indeterminate.  LV Wall Scoring: The mid inferoseptal segment, apical septal segment, and apical inferior segment are akinetic. Right Ventricle: The right ventricular size is normal. No increase in right ventricular wall thickness. Right ventricular systolic function is normal. Left Atrium: Left atrial size was normal in size. Right Atrium: Right atrial size was normal in size. Pericardium: There is no evidence of pericardial effusion. Mitral Valve: The mitral valve is normal in structure. Mild mitral valve regurgitation. No evidence of mitral valve stenosis. MV peak gradient, 18.5 mmHg. The mean mitral valve gradient is 6.0 mmHg. Tricuspid Valve: The tricuspid valve is normal in structure. Tricuspid valve regurgitation is mild . No evidence of tricuspid stenosis. Aortic Valve: The aortic valve is calcified. There is mild calcification of the aortic valve. Aortic valve regurgitation is mild. Aortic valve sclerosis is present, with no evidence of aortic valve stenosis. Aortic valve mean gradient measures 9.2 mmHg. Aortic valve peak gradient measures 18.7 mmHg. Aortic valve area, by VTI measures 1.54 cm. Pulmonic Valve: The pulmonic valve was normal in structure. Pulmonic valve regurgitation is not visualized. No evidence of pulmonic stenosis. Aorta: The aortic root is normal in size and structure. Venous: The inferior vena cava is normal in size with greater than 50% respiratory variability, suggesting right atrial pressure of 3 mmHg. IAS/Shunts: No atrial level shunt detected by color flow Doppler.  LEFT VENTRICLE PLAX 2D LVIDd:         5.70 cm     Diastology LVIDs:         3.60 cm     LV e' medial:    9.57 cm/s LV PW:         1.20 cm     LV E/e' medial:  14.9 LV IVS:        1.10 cm     LV e' lateral:   12.30 cm/s LVOT diam:     1.70 cm     LV E/e' lateral: 11.6 LV SV:         73 LV SV Index:   36 LVOT Area:     2.27 cm  LV Volumes (MOD) LV vol d, MOD A4C: 84.5 ml LV SV MOD A4C:     84.5 ml RIGHT VENTRICLE              IVC RV S prime:     15.70 cm/s  IVC diam: 2.10 cm TAPSE (M-mode): 2.9 cm LEFT ATRIUM             Index        RIGHT ATRIUM           Index LA diam:  4.90 cm 2.42 cm/m   RA Area:     19.20 cm LA Vol (A2C):   41.1 ml 20.29 ml/m  RA Volume:   50.80 ml  25.08 ml/m LA Vol (A4C):   56.5 ml 27.90 ml/m LA Biplane Vol: 48.4 ml 23.90 ml/m  AORTIC VALVE AV Area (Vmax):    1.52 cm AV Area (Vmean):   1.51 cm AV Area (VTI):     1.54 cm AV Vmax:           216.20 cm/s AV Vmean:          138.200 cm/s AV VTI:            0.472 m AV Peak Grad:      18.7 mmHg AV Mean Grad:      9.2 mmHg LVOT Vmax:         144.67 cm/s LVOT Vmean:        91.800 cm/s LVOT VTI:          0.320 m LVOT/AV VTI ratio: 0.68  AORTA Ao Root diam: 3.20 cm Ao Asc diam:  3.60 cm MITRAL VALVE                TRICUSPID VALVE MV Area VTI:  1.40 cm      TR Peak grad:   34.6 mmHg MV Peak grad: 18.5 mmHg     TR Vmax:        294.00 cm/s MV Mean grad: 6.0 mmHg MV Vmax:      2.15 m/s      SHUNTS MV Vmean:     104.0 cm/s    Systemic VTI:  0.32 m MR Peak grad: 94.1 mmHg     Systemic Diam: 1.70 cm MR Mean grad: 67.0 mmHg MR Vmax:      485.00 cm/s MR Vmean:     390.0 cm/s MV E velocity: 143.00 cm/s Candee Furbish MD Electronically signed by Candee Furbish MD Signature Date/Time: 05/31/2022/1:39:19 PM    Final    CARDIAC CATHETERIZATION  Addendum Date: 05/30/2022     2nd Diag lesion is 30% stenosed.   RV Branch lesion is 90% stenosed.   RPDA lesion is 80% stenosed.   Mid RCA-1 lesion is 90% stenosed.   Mid RCA-2 lesion is 90% stenosed.   Dist RCA lesion is 75% stenosed.   Prox LAD lesion is 75% stenosed.   Mid Cx lesion is 50% stenosed.   Non-stenotic Prox RCA lesion was previously treated.   LV end diastolic pressure is mildly elevated.   There is no aortic valve stenosis.   Aortic saturation 97%, PA saturation 65%, PA pressure 35/16, mean PA pressure 24 mmHg, mean pulmonary capillary wedge pressure 17 mmHg, right atrial pressure 4/7 mmHg, cardiac output 6.94  L/min, cardiac index 3.43. Severe in-stent restenosis in the mid RCA.  Denovo distal RCA lesion.  Back in July 2023, there was a moderate lesion but this has increased significantly. Proximal LAD lesion worsened as well compared to July 2023.  There is now an irregular 75% stenosis. Essentially normal right heart pressures. Given multiple episodes of restenosis, repeat PCI will not likely given the long-term benefit.  Now with proximal LAD lesion which has changed in the last few months, will get cardiac surgery consult.  Will hold antiplatelet therapy in the event that he is a candidate for surgery.  May need to start IV cangrelor for antiplatelet therapy.  Result Date: 05/30/2022   2nd Diag lesion is 30% stenosed.   RV  Branch lesion is 90% stenosed.   RPDA lesion is 80% stenosed.   Mid RCA-1 lesion is 90% stenosed.   Mid RCA-2 lesion is 90% stenosed.   Dist RCA lesion is 75% stenosed.   Prox LAD lesion is 75% stenosed.   Mid Cx lesion is 50% stenosed.   Non-stenotic Prox RCA lesion was previously treated.   LV end diastolic pressure is mildly elevated.   There is no aortic valve stenosis.   Aortic saturation 97%, PA saturation 65%, PA pressure 35/16, mean PA pressure 24 mmHg, mean pulmonary capillary wedge pressure 17 mmHg, right atrial pressure 4/7 mmHg, cardiac output 6.94 L/min, cardiac index 3.43. Severe in-stent restenosis in the mid RCA.  Denovo distal RCA lesion.  Back in July 2023, there was a moderate lesion but this has increased significantly. Proximal LAD lesion worsened as well compared to July 2023.  There is now an irregular 75% stenosis. Essentially normal right heart pressures. Given multiple episodes of restenosis, repeat PCI will not likely given the long-term benefit.  Now with proximal LAD lesion which has changed in the last few months, will get cardiac surgery consult.  Will hold antiplatelet therapy in the event that he is a candidate for surgery.  May need to start IV cangrelor for  antiplatelet therapy.     Patient Profile     71 year old male with past medical history of diabetes mellitus, hypertension, hyperlipidemia, obstructive sleep apnea, end-stage renal disease on peritoneal dialysis, coronary artery disease with history of PCI of the right coronary artery for evaluation of unstable angina.  Cardiac catheterization revealed severe in-stent restenosis in the mid right coronary artery as well as the distal RCA lesion, 75% proximal LAD and 50% circumflex.  Echocardiogram shows normal LV function and mild aortic insufficiency.  Assessment & Plan    1 unstable angina/NSTEMI-cardiac catheterization revealed severe in-stent restenosis in the right coronary artery and also with proximal LAD disease.  Echocardiogram shows preserved LV function.  Plan tentatively is to proceed with coronary artery bypass and graft on Monday.  Continue aspirin, Nitropaste, heparin and statin.  Patient is not on a beta-blocker given baseline bradycardia.  2 Mobitz 1 second-degree AV block-this has been noted previously and he has had no prolonged pauses.  He did have complete heart block at night and saw Dr. Caryl Comes but patient apparently was not on CPAP and has been managed conservatively.  No indication for pacemaker at present but likely will require in the future.  Continue telemetry.  3 end-stage renal disease-peritoneal dialysis dependent. Nephrology following.  4 hyperlipidemia-continue statin.  5 obstructive sleep apnea-continue CPAP.  For questions or updates, please contact [place] Please consult [email or web address] for contact info under        Signed, Kirk Ruths, MD  06/01/2022, 7:53 AM

## 2022-06-01 NOTE — Progress Notes (Signed)
Pre op education completed, education included sternal precautions, expected LOS, IS instructions (Baseline 1588m), OHS book and careguide, importance of mobility, and d/c planning. Wife will be with pt upon d/c.   [id number] JChristen Bame MKingston EstatesEP 06/01/2022 10:04 AM

## 2022-06-01 NOTE — TOC Initial Note (Addendum)
Transition of Care [facility]) - Initial/Assessment Note    Patient Details  Name: [name] [name] MRN: [id number] Date of Birth: [birth year]  Transition of [facility]) CM/SW Contact:    Bethena Roys, RN Phone Number: 06/01/2022, 11:39 AM  Clinical Narrative: Risk for readmission assessment completed. Patient presented for chest home-from home with spouse. Plan is tentatively for CABG on 06-05-22. Case Manager will continue to follow for transition of care needs as the patient progresses.                   Expected Discharge Plan: [place] Barriers to Discharge: Continued Medical Work up   Patient Goals and CMS Choice Patient states their goals for this hospitalization and ongoing recovery are:: to return home      Expected Discharge Plan and Services Expected Discharge Plan: [place] In-house Referral: NA Discharge Planning Services: CM Consult   Living arrangements for the past 2 months: Single Family Home                   DME Agency: NA    Prior Living Arrangements/Services Living arrangements for the past 2 months: Single Family Home Lives with:: Spouse Patient language and need for interpreter reviewed:: Yes Do you feel safe going back to the place where you live?: Yes      Need for Family Participation in Patient Care: Yes (Comment) Care giver support system in place?: Yes (comment)   Criminal Activity/Legal Involvement Pertinent to Current Situation/Hospitalization: No - Comment as needed  Activities of Daily Living      Permission Sought/Granted Permission sought to share information with : Family Supports, Case Manager                Emotional Assessment Appearance:: Appears stated age Attitude/Demeanor/Rapport: Engaged Affect (typically observed): Appropriate Orientation: : Oriented to Self, Oriented to Place, Oriented to  Time, Oriented to Situation Alcohol / Substance Use: Not Applicable Psych  Involvement: No (comment)  Admission diagnosis:  End stage renal disease on dialysis ([place]) [N18.6, Z99.2] NSTEMI (non-ST elevated myocardial infarction) [facility]) [I21.4] Patient Active Problem List   Diagnosis Date Noted   Second degree AV block, Mobitz type I 12/27/2021   Unstable angina (HCC)    NSTEMI (non-ST elevated myocardial infarction) ([place])    Rectal bleeding 07/13/2021   Allergic rhinitis 04/07/2020   Asthma 10/02/2019   Chest pain 08/29/2018   Constipation 02/28/2018   End stage renal disease ([place]) 11/13/2017   Foul smelling urine 12/14/2016   Coronary artery disease involving native coronary artery of native heart without angina pectoris 10/27/2016   Chronic kidney disease, stage IV (severe) ([place]) 10/27/2016   Morbid obesity due to excess calories ([place]) 03/22/2016   CAD (coronary artery disease)    Erectile dysfunction 08/16/2015   Hemorrhoids 07/05/2015   Vitamin D deficiency 06/04/2015   OSA on CPAP 05/14/2015   Normocytic anemia 03/24/2015   Fatty liver 12/01/2014   Back pain 06/05/2013   OA (osteoarthritis) of knee 03/15/2011   CTS (carpal tunnel syndrome) 03/15/2011   Diabetes mellitus with stage 5 chronic kidney disease ([place]) 09/29/2008   Hyperlipidemia 09/29/2008   Essential hypertension 09/29/2008   GERD 09/29/2008   PCP:  Carrolyn Meiers, MD Pharmacy:   [facility], [address] AT [address] [place] [address] Phone: [phone] Fax: [phone]  [facility] [place], [address] [place] [address] Phone: [phone] Fax: [phone]  [place], [place] - [address] [place] [address] Phone: [phone] Fax: [phone]  Readmission Risk Interventions    01/20/2022    3:18 PM  Readmission Risk Prevention Plan  Transportation Screening Complete   PCP or Specialist Appt within 3-5 Days Complete  HRI or [place] Complete  Social Work Consult for [place] Planning/Counseling Complete  Palliative Care Screening Not Applicable  Medication Review Press photographer) Complete

## 2022-06-01 NOTE — Progress Notes (Signed)
[facility] NEPHROLOGY PROGRESS NOTE  Assessment/ Plan: Pt is a 71 y.o. yo male   with past medical history significant for OSA, COPD, type II DM, CAD, ESRD on PD, anemia who was presented with chest pain, and NSTEMI, seen as a consultation for the management of ESRD.   Dialysis Orders from previous admission in 01/2022.: [facility] therapies.  CCPD, 5 exchanges 3 liter fill volumes, 10 min fill time, 1 hour 20 minutes dwell, 20 minute drain.  Last fill Icodextran.  EDW 103 kg. Uses combination of 1.5% and 2.5% depending upon BP and weight    #NSTEMI/unstable angina: Cardiac cath on 11/14 with severe in in-stent restenosis in the right coronary artery and proximal LAD disease.  Currently on heparin and cardiac medication.  Plan for CABG on Monday.     # ESRD on PD: Continue CCPD at night, 5 exchanges, 2.5 L fill volume, mix of 1.5 and 2.5% solution.  He does have left upper extremity AV fistula.  We will try to obtain outpatient record from outpatient unit.  No issue with PD last night, continue same prescription today.   # Hypertension: Blood pressure acceptable.  No edema.  Continue current cardiac medication.   # Anemia of ESRD: Hemoglobin around 9.  We will need to obtain records from outpatient treatment and medication.  May need ESA.  Check iron in the morning.   #CKD-MBD: Calcium and phosphorus level acceptable.  Currently on sevelamer and calcitriol.  Discussed with the dialysis nurse. Subjective: Seen and examined.  Denies nausea, vomiting, chest pain, shortness of breath.  Tolerated PD well without any problem.  UF around 1.1 L per nurse. Objective Vital signs in last 24 hours: Vitals:   05/30/22 2251 05/31/22 0016 05/31/22 0349 05/31/22 2016  BP: 96/60 120/64 120/72 (!) 145/75  Pulse:  86  60  Resp:  '18 17 20  '$ Temp:  (!) 97.5 F (36.4 C) (!) 97.4 F (36.3 C) (!) 97.3 F (36.3 C)  TempSrc:  Oral Oral Oral  SpO2:  99% 96% 98%  Weight:      Height:        Weight change:   Intake/Output Summary (Last 24 hours) at 06/01/2022 1107 Last data filed at 06/01/2022 0908 Gross per 24 hour  Intake 521.86 ml  Output 1154 ml  Net -632.14 ml       Labs: RENAL PANEL Recent Labs    07/24/21 0254 07/25/21 0201 07/29/21 1410 08/14/21 2152 12/14/21 0001 12/22/21 1017 12/27/21 0215 01/18/22 1149 01/18/22 1436 01/20/22 0210 01/21/22 0229 05/30/22 0341 05/30/22 1148 05/30/22 1507 05/30/22 1513 05/30/22 1517 05/31/22 0252 06/01/22 0212  NA 130*   < > 134* 130* 135 136 133* 135  --  133* 134* 136  --  135 135 135 136 136  K 3.3*   < > 4.0 3.4* 3.7 4.6 3.6 3.9  --  3.5 3.7 3.6  --  3.5 3.5 3.5 4.0 3.9  CL 89*   < > 90* 91* 98 92* 95* 94*  --  95* 95* 96*  --   --   --   --  98 96*  CO2 26   < > '27 23 25 28 29 29  '$ --  '24 27 26  '$ --   --   --   --  27 26  GLUCOSE 97   < > 124* 104* 123* 117* 134* 116*  --  130* 144* 128*  --   --   --   --  101* 121*  BUN 73*   < > 67* 50* 67* 58* 52* 40*  --  51* 47* 44*  --   --   --   --  48* 50*  CREATININE 20.21*   < > 20.22* 18.51* 18.41* 16.87* 17.44* 17.26* 17.08* 19.15* 18.48* 17.29*  --   --   --   --  19.27* 19.46*  CALCIUM 9.0   < > 9.4 9.5 9.3 10.5* 9.1 9.8  --  9.5 9.3 9.9  --   --   --   --  9.4 9.3  MG  --   --  2.4  --   --   --   --   --   --   --   --   --  2.1  --   --   --   --  2.0  PHOS 8.8*  --   --   --   --   --   --   --   --   --   --   --   --   --   --   --   --  3.3  ALBUMIN 3.0*  2.8*  --   --  2.7*  --   --   --  3.2*  --   --   --   --  3.0*  --   --   --   --  2.6*   < > = values in this interval not displayed.     Liver Function Tests: Recent Labs  Lab 05/30/22 1148 06/01/22 0212  AST 14* 32  ALT 13 13  ALKPHOS 68 46  BILITOT 0.3 0.4  PROT 6.4* 5.8*  ALBUMIN 3.0* 2.6*   No results for input(s): "LIPASE", "AMYLASE" in the last 168 hours. No results for input(s): "AMMONIA" in the last 168 hours. CBC: Recent Labs    05/30/22 1507 05/30/22 1513  05/30/22 1517 05/31/22 0252 06/01/22 0212  HGB 8.5* 8.5* 8.8* 9.5* 9.0*  MCV  --   --   --  92.9 93.3    Cardiac Enzymes: No results for input(s): "CKTOTAL", "CKMB", "CKMBINDEX", "TROPONINI" in the last 168 hours. CBG: Recent Labs  Lab 05/31/22 1636 05/31/22 2033 05/31/22 2308 06/01/22 0513 06/01/22 0811  GLUCAP 98 148* 127* 102* 90    Iron Studies: No results for input(s): "IRON", "TIBC", "TRANSFERRIN", "FERRITIN" in the last 72 hours. Studies/Results: ECHOCARDIOGRAM COMPLETE  Result Date: 05/31/2022    ECHOCARDIOGRAM REPORT   Patient Name:   [name] [name] Date of Exam: 05/31/2022 Medical Rec #:  [id number]      Height:       64.0 in Accession #:    [id number]     Weight:       217.0 lb Date of Birth:  03/21/1951     BSA:          2.025 m Patient Age:    64 years       BP:           120/72 mmHg Patient Gender: M              HR:           49 bpm. Exam Location:  Inpatient Procedure: 2D Echo, Cardiac Doppler, Color Doppler and Intracardiac            Opacification Agent Indications:    NSTEMI I21..4  History:        Patient has prior  history of Echocardiogram examinations, most                 recent 01/19/2022. CAD, Previous Myocardial Infarction and Angina,                 Signs/Symptoms:Chest Pain; Risk Factors:Hypertension, Sleep                 Apnea, Diabetes and Dyslipidemia.  Sonographer:    Ronny Flurry Sonographer#2:  Clayton Lefort RDCS (AE) Referring Phys: [id number] Jettie Booze  Sonographer Comments: Image acquisition challenging due to respiratory motion. IMPRESSIONS  1. Left ventricular ejection fraction, by estimation, is 50 to 55%. The left ventricle has low normal function. The left ventricle demonstrates regional wall motion abnormalities (see scoring diagram/findings for description). There is mild left ventricular hypertrophy. Left ventricular diastolic parameters are indeterminate.  2. Right ventricular systolic function is normal. The right ventricular size is normal.   3. The mitral valve is normal in structure. Mild mitral valve regurgitation. No evidence of mitral stenosis.  4. The aortic valve is calcified. There is mild calcification of the aortic valve. Aortic valve regurgitation is mild. Aortic valve sclerosis is present, with no evidence of aortic valve stenosis. Aortic valve mean gradient measures 9.2 mmHg. Aortic valve Vmax measures 2.16 m/s.  5. The inferior vena cava is normal in size with greater than 50% respiratory variability, suggesting right atrial pressure of 3 mmHg. FINDINGS  Left Ventricle: Left ventricular ejection fraction, by estimation, is 50 to 55%. The left ventricle has low normal function. The left ventricle demonstrates regional wall motion abnormalities. Definity contrast agent was given IV to delineate the left ventricular endocardial borders. The left ventricular internal cavity size was normal in size. There is mild left ventricular hypertrophy. Left ventricular diastolic parameters are indeterminate.  LV Wall Scoring: The mid inferoseptal segment, apical septal segment, and apical inferior segment are akinetic. Right Ventricle: The right ventricular size is normal. No increase in right ventricular wall thickness. Right ventricular systolic function is normal. Left Atrium: Left atrial size was normal in size. Right Atrium: Right atrial size was normal in size. Pericardium: There is no evidence of pericardial effusion. Mitral Valve: The mitral valve is normal in structure. Mild mitral valve regurgitation. No evidence of mitral valve stenosis. MV peak gradient, 18.5 mmHg. The mean mitral valve gradient is 6.0 mmHg. Tricuspid Valve: The tricuspid valve is normal in structure. Tricuspid valve regurgitation is mild . No evidence of tricuspid stenosis. Aortic Valve: The aortic valve is calcified. There is mild calcification of the aortic valve. Aortic valve regurgitation is mild. Aortic valve sclerosis is present, with no evidence of aortic valve  stenosis. Aortic valve mean gradient measures 9.2 mmHg. Aortic valve peak gradient measures 18.7 mmHg. Aortic valve area, by VTI measures 1.54 cm. Pulmonic Valve: The pulmonic valve was normal in structure. Pulmonic valve regurgitation is not visualized. No evidence of pulmonic stenosis. Aorta: The aortic root is normal in size and structure. Venous: The inferior vena cava is normal in size with greater than 50% respiratory variability, suggesting right atrial pressure of 3 mmHg. IAS/Shunts: No atrial level shunt detected by color flow Doppler.  LEFT VENTRICLE PLAX 2D LVIDd:         5.70 cm     Diastology LVIDs:         3.60 cm     LV e' medial:    9.57 cm/s LV PW:         1.20 cm  LV E/e' medial:  14.9 LV IVS:        1.10 cm     LV e' lateral:   12.30 cm/s LVOT diam:     1.70 cm     LV E/e' lateral: 11.6 LV SV:         73 LV SV Index:   36 LVOT Area:     2.27 cm  LV Volumes (MOD) LV vol d, MOD A4C: 84.5 ml LV SV MOD A4C:     84.5 ml RIGHT VENTRICLE             IVC RV S prime:     15.70 cm/s  IVC diam: 2.10 cm TAPSE (M-mode): 2.9 cm LEFT ATRIUM             Index        RIGHT ATRIUM           Index LA diam:        4.90 cm 2.42 cm/m   RA Area:     19.20 cm LA Vol (A2C):   41.1 ml 20.29 ml/m  RA Volume:   50.80 ml  25.08 ml/m LA Vol (A4C):   56.5 ml 27.90 ml/m LA Biplane Vol: 48.4 ml 23.90 ml/m  AORTIC VALVE AV Area (Vmax):    1.52 cm AV Area (Vmean):   1.51 cm AV Area (VTI):     1.54 cm AV Vmax:           216.20 cm/s AV Vmean:          138.200 cm/s AV VTI:            0.472 m AV Peak Grad:      18.7 mmHg AV Mean Grad:      9.2 mmHg LVOT Vmax:         144.67 cm/s LVOT Vmean:        91.800 cm/s LVOT VTI:          0.320 m LVOT/AV VTI ratio: 0.68  AORTA Ao Root diam: 3.20 cm Ao Asc diam:  3.60 cm MITRAL VALVE                TRICUSPID VALVE MV Area VTI:  1.40 cm      TR Peak grad:   34.6 mmHg MV Peak grad: 18.5 mmHg     TR Vmax:        294.00 cm/s MV Mean grad: 6.0 mmHg MV Vmax:      2.15 m/s      SHUNTS MV  Vmean:     104.0 cm/s    Systemic VTI:  0.32 m MR Peak grad: 94.1 mmHg     Systemic Diam: 1.70 cm MR Mean grad: 67.0 mmHg MR Vmax:      485.00 cm/s MR Vmean:     390.0 cm/s MV E velocity: 143.00 cm/s Candee Furbish MD Electronically signed by Candee Furbish MD Signature Date/Time: 05/31/2022/1:39:19 PM    Final    CARDIAC CATHETERIZATION  Addendum Date: 05/30/2022     2nd Diag lesion is 30% stenosed.   RV Branch lesion is 90% stenosed.   RPDA lesion is 80% stenosed.   Mid RCA-1 lesion is 90% stenosed.   Mid RCA-2 lesion is 90% stenosed.   Dist RCA lesion is 75% stenosed.   Prox LAD lesion is 75% stenosed.   Mid Cx lesion is 50% stenosed.   Non-stenotic Prox RCA lesion was previously treated.   LV end diastolic pressure is mildly elevated.  There is no aortic valve stenosis.   Aortic saturation 97%, PA saturation 65%, PA pressure 35/16, mean PA pressure 24 mmHg, mean pulmonary capillary wedge pressure 17 mmHg, right atrial pressure 4/7 mmHg, cardiac output 6.94 L/min, cardiac index 3.43. Severe in-stent restenosis in the mid RCA.  Denovo distal RCA lesion.  Back in July 2023, there was a moderate lesion but this has increased significantly. Proximal LAD lesion worsened as well compared to July 2023.  There is now an irregular 75% stenosis. Essentially normal right heart pressures. Given multiple episodes of restenosis, repeat PCI will not likely given the long-term benefit.  Now with proximal LAD lesion which has changed in the last few months, will get cardiac surgery consult.  Will hold antiplatelet therapy in the event that he is a candidate for surgery.  May need to start IV cangrelor for antiplatelet therapy.  Result Date: 05/30/2022   2nd Diag lesion is 30% stenosed.   RV Branch lesion is 90% stenosed.   RPDA lesion is 80% stenosed.   Mid RCA-1 lesion is 90% stenosed.   Mid RCA-2 lesion is 90% stenosed.   Dist RCA lesion is 75% stenosed.   Prox LAD lesion is 75% stenosed.   Mid Cx lesion is 50% stenosed.    Non-stenotic Prox RCA lesion was previously treated.   LV end diastolic pressure is mildly elevated.   There is no aortic valve stenosis.   Aortic saturation 97%, PA saturation 65%, PA pressure 35/16, mean PA pressure 24 mmHg, mean pulmonary capillary wedge pressure 17 mmHg, right atrial pressure 4/7 mmHg, cardiac output 6.94 L/min, cardiac index 3.43. Severe in-stent restenosis in the mid RCA.  Denovo distal RCA lesion.  Back in July 2023, there was a moderate lesion but this has increased significantly. Proximal LAD lesion worsened as well compared to July 2023.  There is now an irregular 75% stenosis. Essentially normal right heart pressures. Given multiple episodes of restenosis, repeat PCI will not likely given the long-term benefit.  Now with proximal LAD lesion which has changed in the last few months, will get cardiac surgery consult.  Will hold antiplatelet therapy in the event that he is a candidate for surgery.  May need to start IV cangrelor for antiplatelet therapy.    Medications: Infusions:  dialysis solution 1.5% low-MG/low-CA     dialysis solution 2.5% low-MG/low-CA     heparin 1,450 Units/hr (06/01/22 0957)    Scheduled Medications:  aspirin EC  81 mg Oral Daily   calcitRIOL  0.25 mcg Oral Daily   gentamicin cream  1 Application Topical Daily   insulin aspart  0-6 Units Subcutaneous TID WC   insulin glargine-yfgn  15 Units Subcutaneous Daily   linagliptin  5 mg Oral Daily   nitroGLYCERIN  1 inch Topical Q6H   pantoprazole  40 mg Oral BID AC   sevelamer carbonate  1,600 mg Oral TID WC   simvastatin  20 mg Oral q morning   sodium chloride flush  3 mL Intravenous Q12H   torsemide  100 mg Oral Daily    have reviewed scheduled and prn medications.  Physical Exam: General:NAD, comfortable Heart:RRR, s1s2 nl Lungs:clear b/l, no crackle Abdomen:soft, Non-tender, non-distended Extremities:No edema Dialysis Access: PD catheter site clean, left upper extremity AV fistula has  good thrill and bruit.  Kandace Elrod Prasad Ademola Vert 06/01/2022,11:07 AM  LOS: 2 days

## 2022-06-01 NOTE — Progress Notes (Signed)
ANTICOAGULATION CONSULT NOTE   Pharmacy Consult for heparin Indication: chest pain/ACS and post-cath with cardiac surgery consult  Allergies  Allergen Reactions   Opana [Oxymorphone Hcl] Itching   Tramadol Itching    Patient Measurements: Height: '5\' 4"'$  (162.6 cm) Weight: 98.4 kg (217 lb) IBW/kg (Calculated) : 59.2 Heparin Dosing Weight: 81.3kg  Vital Signs:    Labs: Recent Labs    05/30/22 0341 05/30/22 0640 05/30/22 1148 05/30/22 1507 05/30/22 1517 05/31/22 0252 05/31/22 1742 06/01/22 0212 06/01/22 0747  HGB 8.9*  --   --    < > 8.8* 9.5*  --  9.0*  --   HCT 26.8*  --   --    < > 26.0* 28.8*  --  27.8*  --   PLT 126*  --   --   --   --  115*  --  105*  --   HEPARINUNFRC  --   --   --   --   --   --  0.19*  --  0.20*  CREATININE 17.29*  --   --   --   --  19.27*  --  19.46*  --   TROPONINIHS 100* 142* 330*  --   --   --   --   --   --    < > = values in this interval not displayed.     Estimated Creatinine Clearance: 3.7 mL/min (A) (by C-G formula based on SCr of 19.46 mg/dL (H)).  Medications:  Infusions:   dialysis solution 1.5% low-MG/low-CA     dialysis solution 2.5% low-MG/low-CA     heparin Stopped (05/31/22 2347)    Assessment: 1 yom with significant cardiac history including multiple episodes of restenosis presented to the ED with CP and was initiated on IV heparin. Pt s/p LHC with mvCAD, now planning for CABG next week. Heparin resumed post/cath.  Heparin level is subtherapeutic at 0.2, however there have been numerous difficulties obtaining heparin level samples. L arm is restricted and heparin is infusing via R AC and paused prior to lab collection. Will defer recheck until am labs.  Goal of Therapy:  Heparin level 0.3-0.7 units/ml Monitor platelets by anticoagulation protocol: Yes   Plan:  Increase IV heparin to 1450 units/h Recheck heparin level with morning labs   Arrie Senate, PharmD, BCPS, [facility] Clinical Pharmacist [phone] Please  check AMION for all [facility] numbers 06/01/2022

## 2022-06-01 NOTE — Progress Notes (Signed)
PROGRESS NOTE    [name] [name]  GNF:[id number] DOB: [birth year] DOA: 05/30/2022 PCP: Carrolyn Meiers, MD  Chief Complaint  Patient presents with   Chest Pain    Brief Narrative:  [name] [name] is [name] [name] 71 y.o. male with medical history significant of unstable angina, CAD with chronic RCA in-stent stenosis limited, ESRD on PD, anemia secondary to CKD, COPD, presented with worsening of chest pains.   Patient has [name] [name] known RCA stenosis including in-stent stenosis identified in the cardiac cath earlier this year, for which patient has been following with cardiologist and has been on dual antiplatelet therapy including aspirin and Brilinta.  Despite, patient has had unstable angina episodes at home.  He reported usually those episodes responded to nitroglycerin, however last night, about 10 PM, patient started to have severe chest pain, pressure-like, associated with shortness of breath, he immediately took 3 nitroglycerin, and chest pain partial relief but in the matter of 20 to 30 minutes, chest pain came back again. ED Course: Patient was given nitroglycerin sublingual and then switched to Nitropaste and chest pain partially relieved.  Blood pressure elevated, x-ray showed mild pulmonary congestion but no hypoxia.   Assessment & Plan: Principal Problem:   NSTEMI (non-ST elevated myocardial infarction) ([place])  NSTEMI with known CAD and chronic RCA in-stent stenosis -On heparin drip -nitro paste ordered -Continue aspirin, Brilinta and statin - no RWMA, low normal EF - scheduled nitro paste and prn nitro for recurrent CP - appreciate cardiology assistance -Cardiology and CT surgery following, planning for 2 vessel off pump coronary bypass grafting   HTN, uncontrolled -Continue Nitropaste q6  -On torsemide   ESRD on PD -Neprho consulted    IDDM - not receiving basal insulin, BG's look ok on SSI alone - will hold basal insulin for now, follow    COPD -No acute concern, continue  as needed albuterol     DVT prophylaxis: heparin gtt Code Status: full Family Communication: none Disposition:   Status is: Inpatient Remains inpatient appropriate because: need for CT surgery eval   Consultants:  Cardiology CT surgery renal  Procedures:  Echo IMPRESSIONS     1. Left ventricular ejection fraction, by estimation, is 50 to 55%. The  left ventricle has low normal function. The left ventricle demonstrates  regional wall motion abnormalities (see scoring diagram/findings for  description). There is mild left  ventricular hypertrophy. Left ventricular diastolic parameters are  indeterminate.   2. Right ventricular systolic function is normal. The right ventricular  size is normal.   3. The mitral valve is normal in structure. Mild mitral valve  regurgitation. No evidence of mitral stenosis.   4. The aortic valve is calcified. There is mild calcification of the  aortic valve. Aortic valve regurgitation is mild. Aortic valve sclerosis  is present, with no evidence of aortic valve stenosis. Aortic valve mean  gradient measures 9.2 mmHg. Aortic  valve Vmax measures 2.16 m/s.   5. The inferior vena cava is normal in size with greater than 50%  respiratory variability, suggesting right atrial pressure of 3 mmHg.   LHC 2nd Diag lesion is 30% stenosed.   RV Branch lesion is 90% stenosed.   RPDA lesion is 80% stenosed.   Mid RCA-1 lesion is 90% stenosed.   Mid RCA-2 lesion is 90% stenosed.   Dist RCA lesion is 75% stenosed.   Prox LAD lesion is 75% stenosed.   Mid Cx lesion is 50% stenosed.   Non-stenotic Prox RCA lesion was previously  treated.   LV end diastolic pressure is mildly elevated.   There is no aortic valve stenosis.   Aortic saturation 97%, PA saturation 65%, PA pressure 35/16, mean PA pressure 24 mmHg, mean pulmonary capillary wedge pressure 17 mmHg, right atrial pressure 4/7 mmHg, cardiac output 6.94 L/min, cardiac index 3.43.   Severe in-stent  restenosis in the mid RCA.  Denovo distal RCA lesion.  Back in July 2023, there was [name] [name] moderate lesion but this has increased significantly. Proximal LAD lesion worsened as well compared to July 2023.  There is now an irregular 75% stenosis. Essentially normal right heart pressures.   Given multiple episodes of restenosis, repeat PCI will not likely given the long-term benefit.  Now with proximal LAD lesion which has changed in the last few months, will get cardiac surgery consult.  Will hold antiplatelet therapy in the event that he is [name] [name] candidate for surgery.  May need to start IV cangrelor for antiplatelet therapy.   Antimicrobials:  Anti-infectives (From admission, onward)    None       Subjective: No CP at rest Had CP when getting up to bathroom, it'd resolved by the time I saw him  Objective: Vitals:   05/31/22 0349 05/31/22 2016 06/01/22 1627 06/01/22 1700  BP: 120/72 (!) 145/75 116/64 124/62  Pulse:  60  (!) 49  Resp: '17 20  18  '$ Temp: (!) 97.4 F (36.3 C) (!) 97.3 F (36.3 C)  97.6 F (36.4 C)  TempSrc: Oral Oral  Oral  SpO2: 96% 98%  91%  Weight:      Height:        Intake/Output Summary (Last 24 hours) at 06/01/2022 1823 Last data filed at 06/01/2022 0908 Gross per 24 hour  Intake 521.86 ml  Output 1154 ml  Net -632.14 ml   Filed Weights   05/30/22 0346  Weight: 98.4 kg    Examination:  General: No acute distress. Cardiovascular: RRR Lungs: unlabored Abdomen: Soft, nontender, nondistended  Neurological: Alert and oriented 3. Moves all extremities 4 with equal strength. Cranial nerves II through XII grossly intact. Extremities: No clubbing or cyanosis. No edema.   Data Reviewed: I have personally reviewed following labs and imaging studies  CBC: Recent Labs  Lab 05/30/22 0341 05/30/22 1507 05/30/22 1513 05/30/22 1517 05/31/22 0252 06/01/22 0212  WBC 4.4  --   --   --  5.3 5.6  NEUTROABS 3.3  --   --   --   --  4.4  HGB 8.9* 8.5* 8.5* 8.8*  9.5* 9.0*  HCT 26.8* 25.0* 25.0* 26.0* 28.8* 27.8*  MCV 94.0  --   --   --  92.9 93.3  PLT 126*  --   --   --  115* 105*    Basic Metabolic Panel: Recent Labs  Lab 05/30/22 0341 05/30/22 1148 05/30/22 1507 05/30/22 1513 05/30/22 1517 05/31/22 0252 06/01/22 0212  NA 136  --  135 135 135 136 136  K 3.6  --  3.5 3.5 3.5 4.0 3.9  CL 96*  --   --   --   --  98 96*  CO2 26  --   --   --   --  27 26  GLUCOSE 128*  --   --   --   --  101* 121*  BUN 44*  --   --   --   --  48* 50*  CREATININE 17.29*  --   --   --   --  19.27* 19.46*  CALCIUM 9.9  --   --   --   --  9.4 9.3  MG  --  2.1  --   --   --   --  2.0  PHOS  --   --   --   --   --   --  3.3    GFR: Estimated Creatinine Clearance: 3.7 mL/min ([name] [name]) (by C-G formula based on SCr of 19.46 mg/dL (H)).  Liver Function Tests: Recent Labs  Lab 05/30/22 1148 06/01/22 0212  AST 14* 32  ALT 13 13  ALKPHOS 68 46  BILITOT 0.3 0.4  PROT 6.4* 5.8*  ALBUMIN 3.0* 2.6*    CBG: Recent Labs  Lab 05/31/22 2308 06/01/22 0513 06/01/22 0811 06/01/22 1145 06/01/22 1610  GLUCAP 127* 102* 90 130* 80     No results found for this or any previous visit (from the past 240 hour(s)).       Radiology Studies: VAS US DOPPLER PRE CABG  Result Date: 06/01/2022 PREOPERATIVE VASCULAR EVALUATION Patient Name:  [name] [name]  Date of Exam:   06/01/2022 Medical Rec #: [id number]       Accession #:    [id number] Date of Birth: 07-26-1950      Patient Gender: M Patient Age:   46 years Exam Location:  [facility] Procedure:      VAS US DOPPLER PRE CABG Referring Phys: HARRELL LIGHTFOOT --------------------------------------------------------------------------------  Indications:            Pre-CABG. Risk Factors:           Hypertension, hyperlipidemia, Diabetes, coronary artery                         disease. Vascular Interventions: Left AVF. Performing Technologist: Archie Patten RVS  Examination Guidelines: Savi Lastinger complete evaluation  includes B-mode imaging, spectral Doppler, color Doppler, and power Doppler as needed of all accessible portions of each vessel. Bilateral testing is considered an integral part of Dakari Stabler complete examination. Limited examinations for reoccurring indications may be performed as noted.  Right Carotid Findings: +----------+--------+--------+--------+------------+--------+           PSV cm/sEDV cm/sStenosisDescribe    Comments +----------+--------+--------+--------+------------+--------+ CCA Prox  88      22              heterogenous         +----------+--------+--------+--------+------------+--------+ CCA Distal89      20              heterogenous         +----------+--------+--------+--------+------------+--------+ ICA Prox  101     24      1-39%   heterogenous         +----------+--------+--------+--------+------------+--------+ ICA Distal59      23                                   +----------+--------+--------+--------+------------+--------+ ECA       244                                          +----------+--------+--------+--------+------------+--------+ +----------+--------+-------+--------+------------+           PSV cm/sEDV cmsDescribeArm Pressure +----------+--------+-------+--------+------------+ [id number]                                 +----------+--------+-------+--------+------------+ +---------+--------+--+--------+--+---------+  VertebralPSV cm/s48EDV cm/s15Antegrade +---------+--------+--+--------+--+---------+ Left Carotid Findings: +----------+--------+--------+--------+------------+---------+           PSV cm/sEDV cm/sStenosisDescribe    Comments  +----------+--------+--------+--------+------------+---------+ CCA Prox  96      25              heterogenous          +----------+--------+--------+--------+------------+---------+ CCA Distal78      20              heterogenous           +----------+--------+--------+--------+------------+---------+ ICA Prox  65      24      1-39%   heterogenousShadowing +----------+--------+--------+--------+------------+---------+ ICA Distal90      33                                    +----------+--------+--------+--------+------------+---------+ ECA       157     23                                    +----------+--------+--------+--------+------------+---------+ +----------+--------+--------+--------+------------+ SubclavianPSV cm/sEDV cm/sDescribeArm Pressure +----------+--------+--------+--------+------------+           101                                  +----------+--------+--------+--------+------------+ +---------+--------+--+--------+--+---------+ VertebralPSV cm/s39EDV cm/s17Antegrade +---------+--------+--+--------+--+---------+  ABI Findings: +---------+------------------+-----+---------+--------+ Right    Rt Pressure (mmHg)IndexWaveform Comment  +---------+------------------+-----+---------+--------+ Brachial 123                    triphasic         +---------+------------------+-----+---------+--------+ PTA      119               0.97 triphasic         +---------+------------------+-----+---------+--------+ DP       114               0.93 triphasic         +---------+------------------+-----+---------+--------+ Audrie Lia               0.97 Normal            +---------+------------------+-----+---------+--------+ +---------+------------------+-----+---------+-------+ Left     Lt Pressure (mmHg)IndexWaveform Comment +---------+------------------+-----+---------+-------+ Brachial                        triphasicAVF     +---------+------------------+-----+---------+-------+ PTA      124               1.01 triphasic        +---------+------------------+-----+---------+-------+ DP       103               0.84 triphasic         +---------+------------------+-----+---------+-------+ Great Toe109               0.89 Normal           +---------+------------------+-----+---------+-------+ +-------+---------------+----------------+ ABI/TBIToday's ABI/TBIPrevious ABI/TBI +-------+---------------+----------------+ Right  0.97           0.97             +-------+---------------+----------------+ Left   1.01           0.89             +-------+---------------+----------------+  Right Doppler  Findings: +--------+--------+-----+---------+--------+ Site    PressureIndexDoppler  Comments +--------+--------+-----+---------+--------+ [id number]          triphasic         +--------+--------+-----+---------+--------+ Radial               triphasic         +--------+--------+-----+---------+--------+ Ulnar                triphasic         +--------+--------+-----+---------+--------+  Left Doppler Findings: +--------+--------+-----+---------+--------+ Site    PressureIndexDoppler  Comments +--------+--------+-----+---------+--------+ Brachial             triphasicAVF      +--------+--------+-----+---------+--------+ Radial               triphasic         +--------+--------+-----+---------+--------+ Ulnar                triphasic         +--------+--------+-----+---------+--------+   Summary: Right Carotid: Velocities in the right ICA are consistent with Lela Gell 1-39% stenosis. Left Carotid: Velocities in the left ICA are consistent with Damarko Stitely 1-39% stenosis. Vertebrals: Bilateral vertebral arteries demonstrate antegrade flow. Right ABI: Resting right ankle-brachial index is within normal range. Left ABI: Resting left ankle-brachial index is within normal range. Right Upper Extremity: Doppler waveform obliterate with right radial compression. Doppler waveforms remain within normal limits with right ulnar compression. Left Upper Extremity: Doppler waveforms decrease 50% with left radial compression.  Doppler waveforms remain within normal limits with left ulnar compression.    Preliminary    ECHOCARDIOGRAM COMPLETE  Result Date: 05/31/2022    ECHOCARDIOGRAM REPORT   Patient Name:   [name] [name] Date of Exam: 05/31/2022 Medical Rec #:  [id number]      Height:       64.0 in Accession #:    [id number]     Weight:       217.0 lb Date of Birth:  [birth year]     BSA:          2.025 m Patient Age:    83 years       BP:           120/72 mmHg Patient Gender: M              HR:           49 bpm. Exam Location:  Inpatient Procedure: 2D Echo, Cardiac Doppler, Color Doppler and Intracardiac            Opacification Agent Indications:    NSTEMI I21..4  History:        Patient has prior history of Echocardiogram examinations, most                 recent 01/19/2022. CAD, Previous Myocardial Infarction and Angina,                 Signs/Symptoms:Chest Pain; Risk Factors:Hypertension, Sleep                 Apnea, Diabetes and Dyslipidemia.  Sonographer:    Ronny Flurry Sonographer#2:  Clayton Lefort RDCS (AE) Referring Phys: [id number] Jettie Booze  Sonographer Comments: Image acquisition challenging due to respiratory motion. IMPRESSIONS  1. Left ventricular ejection fraction, by estimation, is 50 to 55%. The left ventricle has low normal function. The left ventricle demonstrates regional wall motion abnormalities (see scoring diagram/findings for description). There is mild left ventricular hypertrophy. Left ventricular diastolic parameters are indeterminate.  2. Right ventricular  systolic function is normal. The right ventricular size is normal.  3. The mitral valve is normal in structure. Mild mitral valve regurgitation. No evidence of mitral stenosis.  4. The aortic valve is calcified. There is mild calcification of the aortic valve. Aortic valve regurgitation is mild. Aortic valve sclerosis is present, with no evidence of aortic valve stenosis. Aortic valve mean gradient measures 9.2 mmHg. Aortic valve Vmax measures  2.16 m/s.  5. The inferior vena cava is normal in size with greater than 50% respiratory variability, suggesting right atrial pressure of 3 mmHg. FINDINGS  Left Ventricle: Left ventricular ejection fraction, by estimation, is 50 to 55%. The left ventricle has low normal function. The left ventricle demonstrates regional wall motion abnormalities. Definity contrast agent was given IV to delineate the left ventricular endocardial borders. The left ventricular internal cavity size was normal in size. There is mild left ventricular hypertrophy. Left ventricular diastolic parameters are indeterminate.  LV Wall Scoring: The mid inferoseptal segment, apical septal segment, and apical inferior segment are akinetic. Right Ventricle: The right ventricular size is normal. No increase in right ventricular wall thickness. Right ventricular systolic function is normal. Left Atrium: Left atrial size was normal in size. Right Atrium: Right atrial size was normal in size. Pericardium: There is no evidence of pericardial effusion. Mitral Valve: The mitral valve is normal in structure. Mild mitral valve regurgitation. No evidence of mitral valve stenosis. MV peak gradient, 18.5 mmHg. The mean mitral valve gradient is 6.0 mmHg. Tricuspid Valve: The tricuspid valve is normal in structure. Tricuspid valve regurgitation is mild . No evidence of tricuspid stenosis. Aortic Valve: The aortic valve is calcified. There is mild calcification of the aortic valve. Aortic valve regurgitation is mild. Aortic valve sclerosis is present, with no evidence of aortic valve stenosis. Aortic valve mean gradient measures 9.2 mmHg. Aortic valve peak gradient measures 18.7 mmHg. Aortic valve area, by VTI measures 1.54 cm. Pulmonic Valve: The pulmonic valve was normal in structure. Pulmonic valve regurgitation is not visualized. No evidence of pulmonic stenosis. Aorta: The aortic root is normal in size and structure. Venous: The inferior vena cava is normal  in size with greater than 50% respiratory variability, suggesting right atrial pressure of 3 mmHg. IAS/Shunts: No atrial level shunt detected by color flow Doppler.  LEFT VENTRICLE PLAX 2D LVIDd:         5.70 cm     Diastology LVIDs:         3.60 cm     LV e' medial:    9.57 cm/s LV PW:         1.20 cm     LV E/e' medial:  14.9 LV IVS:        1.10 cm     LV e' lateral:   12.30 cm/s LVOT diam:     1.70 cm     LV E/e' lateral: 11.6 LV SV:         73 LV SV Index:   36 LVOT Area:     2.27 cm  LV Volumes (MOD) LV vol d, MOD A4C: 84.5 ml LV SV MOD A4C:     84.5 ml RIGHT VENTRICLE             IVC RV S prime:     15.70 cm/s  IVC diam: 2.10 cm TAPSE (M-mode): 2.9 cm LEFT ATRIUM             Index        RIGHT ATRIUM  Index LA diam:        4.90 cm 2.42 cm/m   RA Area:     19.20 cm LA Vol (A2C):   41.1 ml 20.29 ml/m  RA Volume:   50.80 ml  25.08 ml/m LA Vol (A4C):   56.5 ml 27.90 ml/m LA Biplane Vol: 48.4 ml 23.90 ml/m  AORTIC VALVE AV Area (Vmax):    1.52 cm AV Area (Vmean):   1.51 cm AV Area (VTI):     1.54 cm AV Vmax:           216.20 cm/s AV Vmean:          138.200 cm/s AV VTI:            0.472 m AV Peak Grad:      18.7 mmHg AV Mean Grad:      9.2 mmHg LVOT Vmax:         144.67 cm/s LVOT Vmean:        91.800 cm/s LVOT VTI:          0.320 m LVOT/AV VTI ratio: 0.68  AORTA Ao Root diam: 3.20 cm Ao Asc diam:  3.60 cm MITRAL VALVE                TRICUSPID VALVE MV Area VTI:  1.40 cm      TR Peak grad:   34.6 mmHg MV Peak grad: 18.5 mmHg     TR Vmax:        294.00 cm/s MV Mean grad: 6.0 mmHg MV Vmax:      2.15 m/s      SHUNTS MV Vmean:     104.0 cm/s    Systemic VTI:  0.32 m MR Peak grad: 94.1 mmHg     Systemic Diam: 1.70 cm MR Mean grad: 67.0 mmHg MR Vmax:      485.00 cm/s MR Vmean:     390.0 cm/s MV E velocity: 143.00 cm/s Candee Furbish MD Electronically signed by Candee Furbish MD Signature Date/Time: 05/31/2022/1:39:19 PM    Final         Scheduled Meds:  aspirin EC  81 mg Oral Daily   calcitRIOL  0.25  mcg Oral Daily   gentamicin cream  1 Application Topical Daily   insulin aspart  0-6 Units Subcutaneous TID WC   insulin glargine-yfgn  15 Units Subcutaneous Daily   linagliptin  5 mg Oral Daily   nitroGLYCERIN  1 inch Topical Q6H   pantoprazole  40 mg Oral BID AC   polyethylene glycol  17 g Oral BID   sevelamer carbonate  1,600 mg Oral TID WC   simvastatin  20 mg Oral q morning   sodium chloride flush  3 mL Intravenous Q12H   torsemide  100 mg Oral Daily   Continuous Infusions:  dialysis solution 1.5% low-MG/low-CA     dialysis solution 2.5% low-MG/low-CA     heparin 1,450 Units/hr (06/01/22 0957)     LOS: 2 days    Time spent: over 30 min    Fayrene Helper, MD [facility]   To contact the attending provider between 7A-7P or the covering provider during after hours 7P-7A, please log into the web site [email or web address] and access using universal [place] password for that web site. If you do not have the password, please call the hospital operator.  06/01/2022, 6:23 PM

## 2022-06-01 NOTE — Progress Notes (Signed)
   Pt had CP today when oob. Resolved w/ rest By the time ECG obtained, sx had resolved and ECG ok Is on 1" nitro paste q 6 hr and IV Heparin - tentatively for CABG 11/20  MD review and advise on changing nitrates to IV.  Rosaria Ferries, PA-C 06/01/2022 4:56 PM

## 2022-06-02 DIAGNOSIS — I214 Non-ST elevation (NSTEMI) myocardial infarction: Secondary | ICD-10-CM | POA: Diagnosis not present

## 2022-06-02 LAB — HEPARIN LEVEL (UNFRACTIONATED): Heparin Unfractionated: 0.32 IU/mL (ref 0.30–0.70)

## 2022-06-02 LAB — GLUCOSE, CAPILLARY
Glucose-Capillary: 111 mg/dL — ABNORMAL HIGH (ref 70–99)
Glucose-Capillary: 130 mg/dL — ABNORMAL HIGH (ref 70–99)
Glucose-Capillary: 101 mg/dL — ABNORMAL HIGH (ref 70–99)
Glucose-Capillary: 139 mg/dL — ABNORMAL HIGH (ref 70–99)

## 2022-06-02 LAB — CBC WITH DIFFERENTIAL/PLATELET
Abs Immature Granulocytes: 0.03 10*3/uL (ref 0.00–0.07)
Basophils Absolute: 0 10*3/uL (ref 0.0–0.1)
Basophils Relative: 0 %
Eosinophils Absolute: 0.2 10*3/uL (ref 0.0–0.5)
Eosinophils Relative: 3 %
HCT: 27.2 % — ABNORMAL LOW (ref 39.0–52.0)
Hemoglobin: 8.5 g/dL — ABNORMAL LOW (ref 13.0–17.0)
Immature Granulocytes: 1 %
Lymphocytes Relative: 11 %
Lymphs Abs: 0.5 10*3/uL — ABNORMAL LOW (ref 0.7–4.0)
MCH: 29.8 pg (ref 26.0–34.0)
MCHC: 31.3 g/dL (ref 30.0–36.0)
MCV: 95.4 fL (ref 80.0–100.0)
Monocytes Absolute: 0.4 10*3/uL (ref 0.1–1.0)
Monocytes Relative: 8 %
Neutro Abs: 3.8 10*3/uL (ref 1.7–7.7)
Neutrophils Relative %: 77 %
Platelets: 106 10*3/uL — ABNORMAL LOW (ref 150–400)
RBC: 2.85 MIL/uL — ABNORMAL LOW (ref 4.22–5.81)
RDW: 14.9 % (ref 11.5–15.5)
WBC: 4.9 10*3/uL (ref 4.0–10.5)
nRBC: 0 % (ref 0.0–0.2)

## 2022-06-02 LAB — COMPREHENSIVE METABOLIC PANEL
ALT: 14 U/L (ref 0–44)
AST: 24 U/L (ref 15–41)
Albumin: 2.7 g/dL — ABNORMAL LOW (ref 3.5–5.0)
Alkaline Phosphatase: 42 U/L (ref 38–126)
Anion gap: 14 (ref 5–15)
BUN: 47 mg/dL — ABNORMAL HIGH (ref 8–23)
CO2: 26 mmol/L (ref 22–32)
Calcium: 9 mg/dL (ref 8.9–10.3)
Chloride: 94 mmol/L — ABNORMAL LOW (ref 98–111)
Creatinine, Ser: 19.06 mg/dL — ABNORMAL HIGH (ref 0.61–1.24)
GFR, Estimated: 2 mL/min — ABNORMAL LOW (ref >60)
Glucose, Bld: 132 mg/dL — ABNORMAL HIGH (ref 70–99)
Potassium: 3.9 mmol/L (ref 3.5–5.1)
Sodium: 134 mmol/L — ABNORMAL LOW (ref 135–145)
Total Bilirubin: 0.4 mg/dL (ref 0.3–1.2)
Total Protein: 5.9 g/dL — ABNORMAL LOW (ref 6.5–8.1)

## 2022-06-02 LAB — PHOSPHORUS: Phosphorus: 3.3 mg/dL (ref 2.5–4.6)

## 2022-06-02 LAB — IRON AND TIBC
Iron: 79 ug/dL (ref 45–182)
Saturation Ratios: 27 % (ref 17.9–39.5)
TIBC: 293 ug/dL (ref 250–450)
UIBC: 214 ug/dL

## 2022-06-02 LAB — FERRITIN: Ferritin: 1105 ng/mL — ABNORMAL HIGH (ref 24–336)

## 2022-06-02 LAB — MAGNESIUM: Magnesium: 2 mg/dL (ref 1.7–2.4)

## 2022-06-02 MED ORDER — DARBEPOETIN ALFA 60 MCG/0.3ML IJ SOSY
60.0000 ug | PREFILLED_SYRINGE | Freq: Once | INTRAMUSCULAR | Status: AC
  Administered 2022-06-02: 60 ug via SUBCUTANEOUS
  Filled 2022-06-02: qty 0.3

## 2022-06-02 NOTE — Plan of Care (Signed)
  Problem: Cardiovascular: Goal: Ability to achieve and maintain adequate cardiovascular perfusion will improve Outcome: Progressing Goal: Vascular access site(s) Level 0-1 will be maintained Outcome: Progressing   Problem: Clinical Measurements: Goal: Ability to maintain clinical measurements within normal limits will improve Outcome: Progressing Goal: Will remain free from infection Outcome: Progressing Goal: Diagnostic test results will improve Outcome: Progressing Goal: Respiratory complications will improve Outcome: Progressing Goal: Cardiovascular complication will be avoided Outcome: Progressing   

## 2022-06-02 NOTE — Progress Notes (Signed)
ANTICOAGULATION CONSULT NOTE   Pharmacy Consult for heparin Indication: chest pain/ACS and post-cath with cardiac surgery consult  Allergies  Allergen Reactions   Opana [Oxymorphone Hcl] Itching   Tramadol Itching    Patient Measurements: Height: '5\' 4"'$  (162.6 cm) Weight: 103.5 kg (228 lb 2.8 oz) IBW/kg (Calculated) : 59.2 Heparin Dosing Weight: 81.3kg  Vital Signs: Temp: 98.7 F (37.1 C) (11/17 0507) Temp Source: Oral (11/17 0507) BP: 117/66 (11/17 0507) Pulse Rate: 48 (11/17 0507)  Labs: Recent Labs    05/30/22 1148 05/30/22 1507 05/31/22 0252 05/31/22 1742 06/01/22 0212 06/01/22 0747 06/02/22 0312  HGB  --    < > 9.5*  --  9.0*  --  8.5*  HCT  --    < > 28.8*  --  27.8*  --  27.2*  PLT  --   --  115*  --  105*  --  106*  HEPARINUNFRC  --   --   --  0.19*  --  0.20* 0.32  CREATININE  --   --  19.27*  --  19.46*  --  19.06*  TROPONINIHS 330*  --   --   --   --   --   --    < > = values in this interval not displayed.     Estimated Creatinine Clearance: 3.9 mL/min (A) (by C-G formula based on SCr of 19.06 mg/dL (H)).  Medications:  Infusions:   dialysis solution 1.5% low-MG/low-CA     dialysis solution 2.5% low-MG/low-CA     heparin 1,450 Units/hr (06/01/22 1958)    Assessment: 8 yom with significant cardiac history including multiple episodes of restenosis presented to the ED with CP and was initiated on IV heparin. Pt s/p LHC with mvCAD, now planning for CABG next week. Heparin resumed post/cath.  Heparin level is now at goal at 0.32. Numerous difficulties obtaining heparin level samples so limiting lab draws. L arm is restricted and heparin is infusing via R AC and paused prior to lab collection.   Goal of Therapy:  Heparin level 0.3-0.7 units/ml Monitor platelets by anticoagulation protocol: Yes   Plan:  Increase IV heparin to 1500 units/hr to keep within range Recheck heparin level with morning labs   Erin Hearing PharmD., BCPS Clinical  Pharmacist 06/02/2022 8:23 AM

## 2022-06-02 NOTE — Progress Notes (Signed)
[facility] NEPHROLOGY PROGRESS NOTE  Assessment/ Plan: Pt is a 71 y.o. yo male   with past medical history significant for OSA, COPD, type II DM, CAD, ESRD on PD, anemia who was presented with chest pain, and NSTEMI, seen as a consultation for the management of ESRD.   Dialysis Orders from previous admission in 01/2022.: [facility] therapies.  CCPD, 5 exchanges 3 liter fill volumes, 10 min fill time, 1 hour 20 minutes dwell, 20 minute drain.  Last fill Icodextran.  EDW 103 kg. Uses combination of 1.5% and 2.5% depending upon BP and weight    #NSTEMI/unstable angina: Cardiac cath on 11/14 with severe in in-stent restenosis in the right coronary artery and proximal LAD disease.  Currently on heparin and cardiac medication.  Plan for CABG on Monday.     # ESRD on PD: Continue CCPD at night, 5 exchanges, 2.5 L fill volume, mix of 1.5 and 2.5% solution.  He does have left upper extremity AV fistula.  We will try to obtain outpatient record from outpatient unit.  Doing PD without any issue, UF around 1 L.  Resume PD tonight.   # Hypertension: Blood pressure acceptable.  No edema.  Continue current cardiac medication.   # Anemia of ESRD: Hemoglobin around 9.  Iron saturation 27 with high ferritin.  We will order a dose of Aranesp.  #CKD-MBD: Calcium and phosphorus level acceptable.  Currently on sevelamer and calcitriol.  Discussed with the dialysis nurse. Subjective: Seen and examined.  No new event.  No issue with PD.  Denies nausea, vomiting, chest pain, shortness of breath. Objective Vital signs in last 24 hours: Vitals:   06/01/22 1700 06/01/22 1955 06/01/22 2153 06/02/22 0507  BP: 124/62 119/60 (!) 103/55 117/66  Pulse: (!) 49 (!) 51  (!) 48  Resp: '18 18  16  '$ Temp: 97.6 F (36.4 C) 97.8 F (36.6 C)  98.7 F (37.1 C)  TempSrc: Oral Oral  Oral  SpO2: 91% 92%  100%  Weight:    103.5 kg  Height:       Weight change:   Intake/Output Summary (Last 24 hours)  at 06/02/2022 1040 Last data filed at 06/02/2022 0830 Gross per 24 hour  Intake 262.09 ml  Output 100 ml  Net 162.09 ml        Labs: RENAL PANEL Recent Labs    07/24/21 0254 07/25/21 0201 07/29/21 1410 08/14/21 2152 12/14/21 0001 12/22/21 1017 12/27/21 0215 01/18/22 1149 01/18/22 1436 01/20/22 0210 01/21/22 0229 05/30/22 0341 05/30/22 1148 05/30/22 1507 05/30/22 1513 05/30/22 1517 05/31/22 0252 06/01/22 0212 06/02/22 0312  NA 130*   < > 134* 130* 135 136 133* 135  --  133* 134* 136  --  135 135 135 136 136 134*  K 3.3*   < > 4.0 3.4* 3.7 4.6 3.6 3.9  --  3.5 3.7 3.6  --  3.5 3.5 3.5 4.0 3.9 3.9  CL 89*   < > 90* 91* 98 92* 95* 94*  --  95* 95* 96*  --   --   --   --  98 96* 94*  CO2 26   < > '27 23 25 28 29 29  '$ --  '24 27 26  '$ --   --   --   --  '27 26 26  '$ GLUCOSE 97   < > 124* 104* 123* 117* 134* 116*  --  130* 144* 128*  --   --   --   --  101* 121* 132*  BUN 73*   < > 67* 50* 67* 58* 52* 40*  --  51* 47* 44*  --   --   --   --  48* 50* 47*  CREATININE 20.21*   < > 20.22* 18.51* 18.41* 16.87* 17.44* 17.26* 17.08* 19.15* 18.48* 17.29*  --   --   --   --  19.27* 19.46* 19.06*  CALCIUM 9.0   < > 9.4 9.5 9.3 10.5* 9.1 9.8  --  9.5 9.3 9.9  --   --   --   --  9.4 9.3 9.0  MG  --   --  2.4  --   --   --   --   --   --   --   --   --  2.1  --   --   --   --  2.0 2.0  PHOS 8.8*  --   --   --   --   --   --   --   --   --   --   --   --   --   --   --   --  3.3 3.3  ALBUMIN 3.0*  2.8*  --   --  2.7*  --   --   --  3.2*  --   --   --   --  3.0*  --   --   --   --  2.6* 2.7*   < > = values in this interval not displayed.      Liver Function Tests: Recent Labs  Lab 05/30/22 1148 06/01/22 0212 06/02/22 0312  AST 14* 32 24  ALT '13 13 14  '$ ALKPHOS 68 46 42  BILITOT 0.3 0.4 0.4  PROT 6.4* 5.8* 5.9*  ALBUMIN 3.0* 2.6* 2.7*    No results for input(s): "LIPASE", "AMYLASE" in the last 168 hours. No results for input(s): "AMMONIA" in the last 168 hours. CBC: Recent Labs     05/30/22 1513 05/30/22 1517 05/31/22 0252 06/01/22 0212 06/02/22 0312  HGB 8.5* 8.8* 9.5* 9.0* 8.5*  MCV  --   --  92.9 93.3 95.4  FERRITIN  --   --   --   --  1,105*  TIBC  --   --   --   --  293  IRON  --   --   --   --  79     Cardiac Enzymes: No results for input(s): "CKTOTAL", "CKMB", "CKMBINDEX", "TROPONINI" in the last 168 hours. CBG: Recent Labs  Lab 06/01/22 0811 06/01/22 1145 06/01/22 1610 06/01/22 2131 06/02/22 0757  GLUCAP 90 130* 80 129* 111*     Iron Studies:  Recent Labs    06/02/22 0312  IRON 79  TIBC 293  FERRITIN 1,105*   Studies/Results: VAS US DOPPLER PRE CABG  Result Date: 06/01/2022 PREOPERATIVE VASCULAR EVALUATION Patient Name:  LEGACY LACIVITA  Date of Exam:   06/01/2022 Medical Rec #: [id number]       Accession #:    [id number] Date of Birth: 1950-12-30      Patient Gender: M Patient Age:   57 years Exam Location:  [facility] Procedure:      VAS US DOPPLER PRE CABG Referring Phys: HARRELL LIGHTFOOT --------------------------------------------------------------------------------  Indications:            Pre-CABG. Risk Factors:           Hypertension, hyperlipidemia, Diabetes, coronary artery  disease. Vascular Interventions: Left AVF. Performing Technologist: Archie Patten RVS  Examination Guidelines: A complete evaluation includes B-mode imaging, spectral Doppler, color Doppler, and power Doppler as needed of all accessible portions of each vessel. Bilateral testing is considered an integral part of a complete examination. Limited examinations for reoccurring indications may be performed as noted.  Right Carotid Findings: +----------+--------+--------+--------+------------+--------+           PSV cm/sEDV cm/sStenosisDescribe    Comments +----------+--------+--------+--------+------------+--------+ CCA Prox  88      22              heterogenous          +----------+--------+--------+--------+------------+--------+ CCA Distal89      20              heterogenous         +----------+--------+--------+--------+------------+--------+ ICA Prox  101     24      1-39%   heterogenous         +----------+--------+--------+--------+------------+--------+ ICA Distal59      23                                   +----------+--------+--------+--------+------------+--------+ ECA       244                                          +----------+--------+--------+--------+------------+--------+ +----------+--------+-------+--------+------------+           PSV cm/sEDV cmsDescribeArm Pressure +----------+--------+-------+--------+------------+ Subclavian189                                 +----------+--------+-------+--------+------------+ +---------+--------+--+--------+--+---------+ VertebralPSV cm/s48EDV cm/s15Antegrade +---------+--------+--+--------+--+---------+ Left Carotid Findings: +----------+--------+--------+--------+------------+---------+           PSV cm/sEDV cm/sStenosisDescribe    Comments  +----------+--------+--------+--------+------------+---------+ CCA Prox  96      25              heterogenous          +----------+--------+--------+--------+------------+---------+ CCA Distal78      20              heterogenous          +----------+--------+--------+--------+------------+---------+ ICA Prox  65      24      1-39%   heterogenousShadowing +----------+--------+--------+--------+------------+---------+ ICA Distal90      33                                    +----------+--------+--------+--------+------------+---------+ ECA       157     23                                    +----------+--------+--------+--------+------------+---------+ +----------+--------+--------+--------+------------+ SubclavianPSV cm/sEDV cm/sDescribeArm Pressure  +----------+--------+--------+--------+------------+           101                                  +----------+--------+--------+--------+------------+ +---------+--------+--+--------+--+---------+ VertebralPSV cm/s39EDV cm/s17Antegrade +---------+--------+--+--------+--+---------+  ABI Findings: +---------+------------------+-----+---------+--------+ Right    Rt Pressure (mmHg)IndexWaveform Comment  +---------+------------------+-----+---------+--------+ Brachial 123  triphasic         +---------+------------------+-----+---------+--------+ PTA      119               0.97 triphasic         +---------+------------------+-----+---------+--------+ DP       114               0.93 triphasic         +---------+------------------+-----+---------+--------+ Great Toe119               0.97 Normal            +---------+------------------+-----+---------+--------+ +---------+------------------+-----+---------+-------+ Left     Lt Pressure (mmHg)IndexWaveform Comment +---------+------------------+-----+---------+-------+ Brachial                        triphasicAVF     +---------+------------------+-----+---------+-------+ PTA      124               1.01 triphasic        +---------+------------------+-----+---------+-------+ DP       103               0.84 triphasic        +---------+------------------+-----+---------+-------+ Great Toe109               0.89 Normal           +---------+------------------+-----+---------+-------+ +-------+---------------+----------------+ ABI/TBIToday's ABI/TBIPrevious ABI/TBI +-------+---------------+----------------+ Right  0.97           0.97             +-------+---------------+----------------+ Left   1.01           0.89             +-------+---------------+----------------+  Right Doppler Findings: +--------+--------+-----+---------+--------+ Site    PressureIndexDoppler  Comments  +--------+--------+-----+---------+--------+ [id number]          triphasic         +--------+--------+-----+---------+--------+ Radial               triphasic         +--------+--------+-----+---------+--------+ Ulnar                triphasic         +--------+--------+-----+---------+--------+  Left Doppler Findings: +--------+--------+-----+---------+--------+ Site    PressureIndexDoppler  Comments +--------+--------+-----+---------+--------+ Brachial             triphasicAVF      +--------+--------+-----+---------+--------+ Radial               triphasic         +--------+--------+-----+---------+--------+ Ulnar                triphasic         +--------+--------+-----+---------+--------+   Summary: Right Carotid: Velocities in the right ICA are consistent with a 1-39% stenosis. Left Carotid: Velocities in the left ICA are consistent with a 1-39% stenosis. Vertebrals: Bilateral vertebral arteries demonstrate antegrade flow. Right ABI: Resting right ankle-brachial index is within normal range. Left ABI: Resting left ankle-brachial index is within normal range. Right Upper Extremity: Doppler waveform obliterate with right radial compression. Doppler waveforms remain within normal limits with right ulnar compression. Left Upper Extremity: Doppler waveforms decrease 50% with left radial compression. Doppler waveforms remain within normal limits with left ulnar compression.    Preliminary    ECHOCARDIOGRAM COMPLETE  Result Date: 05/31/2022    ECHOCARDIOGRAM REPORT   Patient Name:   IDUS RATHKE Date of Exam: 05/31/2022 Medical Rec #:  [id number]  Height:       64.0 in Accession #:    [id number]     Weight:       217.0 lb Date of Birth:  May 05, 1951     BSA:          2.025 m Patient Age:    2 years       BP:           120/72 mmHg Patient Gender: M              HR:           49 bpm. Exam Location:  Inpatient Procedure: 2D Echo, Cardiac Doppler, Color Doppler and  Intracardiac            Opacification Agent Indications:    NSTEMI I21..4  History:        Patient has prior history of Echocardiogram examinations, most                 recent 01/19/2022. CAD, Previous Myocardial Infarction and Angina,                 Signs/Symptoms:Chest Pain; Risk Factors:Hypertension, Sleep                 Apnea, Diabetes and Dyslipidemia.  Sonographer:    Ronny Flurry Sonographer#2:  Clayton Lefort RDCS (AE) Referring Phys: [id number] Jettie Booze  Sonographer Comments: Image acquisition challenging due to respiratory motion. IMPRESSIONS  1. Left ventricular ejection fraction, by estimation, is 50 to 55%. The left ventricle has low normal function. The left ventricle demonstrates regional wall motion abnormalities (see scoring diagram/findings for description). There is mild left ventricular hypertrophy. Left ventricular diastolic parameters are indeterminate.  2. Right ventricular systolic function is normal. The right ventricular size is normal.  3. The mitral valve is normal in structure. Mild mitral valve regurgitation. No evidence of mitral stenosis.  4. The aortic valve is calcified. There is mild calcification of the aortic valve. Aortic valve regurgitation is mild. Aortic valve sclerosis is present, with no evidence of aortic valve stenosis. Aortic valve mean gradient measures 9.2 mmHg. Aortic valve Vmax measures 2.16 m/s.  5. The inferior vena cava is normal in size with greater than 50% respiratory variability, suggesting right atrial pressure of 3 mmHg. FINDINGS  Left Ventricle: Left ventricular ejection fraction, by estimation, is 50 to 55%. The left ventricle has low normal function. The left ventricle demonstrates regional wall motion abnormalities. Definity contrast agent was given IV to delineate the left ventricular endocardial borders. The left ventricular internal cavity size was normal in size. There is mild left ventricular hypertrophy. Left ventricular diastolic parameters  are indeterminate.  LV Wall Scoring: The mid inferoseptal segment, apical septal segment, and apical inferior segment are akinetic. Right Ventricle: The right ventricular size is normal. No increase in right ventricular wall thickness. Right ventricular systolic function is normal. Left Atrium: Left atrial size was normal in size. Right Atrium: Right atrial size was normal in size. Pericardium: There is no evidence of pericardial effusion. Mitral Valve: The mitral valve is normal in structure. Mild mitral valve regurgitation. No evidence of mitral valve stenosis. MV peak gradient, 18.5 mmHg. The mean mitral valve gradient is 6.0 mmHg. Tricuspid Valve: The tricuspid valve is normal in structure. Tricuspid valve regurgitation is mild . No evidence of tricuspid stenosis. Aortic Valve: The aortic valve is calcified. There is mild calcification of the aortic valve. Aortic valve regurgitation is mild. Aortic valve sclerosis is present, with  no evidence of aortic valve stenosis. Aortic valve mean gradient measures 9.2 mmHg. Aortic valve peak gradient measures 18.7 mmHg. Aortic valve area, by VTI measures 1.54 cm. Pulmonic Valve: The pulmonic valve was normal in structure. Pulmonic valve regurgitation is not visualized. No evidence of pulmonic stenosis. Aorta: The aortic root is normal in size and structure. Venous: The inferior vena cava is normal in size with greater than 50% respiratory variability, suggesting right atrial pressure of 3 mmHg. IAS/Shunts: No atrial level shunt detected by color flow Doppler.  LEFT VENTRICLE PLAX 2D LVIDd:         5.70 cm     Diastology LVIDs:         3.60 cm     LV e' medial:    9.57 cm/s LV PW:         1.20 cm     LV E/e' medial:  14.9 LV IVS:        1.10 cm     LV e' lateral:   12.30 cm/s LVOT diam:     1.70 cm     LV E/e' lateral: 11.6 LV SV:         73 LV SV Index:   36 LVOT Area:     2.27 cm  LV Volumes (MOD) LV vol d, MOD A4C: 84.5 ml LV SV MOD A4C:     84.5 ml RIGHT VENTRICLE              IVC RV S prime:     15.70 cm/s  IVC diam: 2.10 cm TAPSE (M-mode): 2.9 cm LEFT ATRIUM             Index        RIGHT ATRIUM           Index LA diam:        4.90 cm 2.42 cm/m   RA Area:     19.20 cm LA Vol (A2C):   41.1 ml 20.29 ml/m  RA Volume:   50.80 ml  25.08 ml/m LA Vol (A4C):   56.5 ml 27.90 ml/m LA Biplane Vol: 48.4 ml 23.90 ml/m  AORTIC VALVE AV Area (Vmax):    1.52 cm AV Area (Vmean):   1.51 cm AV Area (VTI):     1.54 cm AV Vmax:           216.20 cm/s AV Vmean:          138.200 cm/s AV VTI:            0.472 m AV Peak Grad:      18.7 mmHg AV Mean Grad:      9.2 mmHg LVOT Vmax:         144.67 cm/s LVOT Vmean:        91.800 cm/s LVOT VTI:          0.320 m LVOT/AV VTI ratio: 0.68  AORTA Ao Root diam: 3.20 cm Ao Asc diam:  3.60 cm MITRAL VALVE                TRICUSPID VALVE MV Area VTI:  1.40 cm      TR Peak grad:   34.6 mmHg MV Peak grad: 18.5 mmHg     TR Vmax:        294.00 cm/s MV Mean grad: 6.0 mmHg MV Vmax:      2.15 m/s      SHUNTS MV Vmean:     104.0 cm/s    Systemic VTI:  0.32  m MR Peak grad: 94.1 mmHg     Systemic Diam: 1.70 cm MR Mean grad: 67.0 mmHg MR Vmax:      485.00 cm/s MR Vmean:     390.0 cm/s MV E velocity: 143.00 cm/s Candee Furbish MD Electronically signed by Candee Furbish MD Signature Date/Time: 05/31/2022/1:39:19 PM    Final     Medications: Infusions:  dialysis solution 1.5% low-MG/low-CA     dialysis solution 2.5% low-MG/low-CA     heparin 1,500 Units/hr (06/02/22 0848)    Scheduled Medications:  aspirin EC  81 mg Oral Daily   calcitRIOL  0.25 mcg Oral Daily   gentamicin cream  1 Application Topical Daily   insulin aspart  0-6 Units Subcutaneous TID WC   linagliptin  5 mg Oral Daily   nitroGLYCERIN  1 inch Topical Q6H   pantoprazole  40 mg Oral BID AC   polyethylene glycol  17 g Oral BID   sevelamer carbonate  1,600 mg Oral TID WC   simvastatin  20 mg Oral q morning   sodium chloride flush  3 mL Intravenous Q12H   torsemide  100 mg Oral Daily    have  reviewed scheduled and prn medications.  Physical Exam: General:NAD, comfortable Heart:RRR, s1s2 nl Lungs:clear b/l, no crackle Abdomen:soft, Non-tender, non-distended Extremities:No edema Dialysis Access: PD catheter site clean, left upper extremity AV fistula has good thrill and bruit.  Tomas Schamp Prasad Noel Henandez 06/02/2022,10:40 AM  LOS: 3 days

## 2022-06-02 NOTE — Progress Notes (Signed)
PROGRESS NOTE    [name] [name]  VOZ:[id number] DOB: March 11, 1951 DOA: 05/30/2022 PCP: Carrolyn Meiers, MD  Chief Complaint  Patient presents with   Chest Pain    Brief Narrative:  ROMON DEVEREUX is [name] [name] 71 y.o. male with medical history significant of unstable angina, CAD with chronic RCA in-stent stenosis limited, ESRD on PD, anemia secondary to CKD, COPD, presented with worsening of chest pains.   Plan for cabg at this point.   Assessment & Plan: Principal Problem:   NSTEMI (non-ST elevated myocardial infarction) ([place])  NSTEMI with known CAD and chronic RCA in-stent stenosis -On heparin drip -Continue aspirin, heparin, and statin - no RWMA, low normal EF - scheduled nitro paste and prn nitro for recurrent CP - appreciate cardiology assistance - LHC with severe in stent restenosis in mid RCA, de novo distal RCA lesion.  Proximal LAD lesion worsened. -Cardiology and CT surgery following, planning for 2 vessel off pump coronary bypass grafting   HTN, uncontrolled -Continue Nitropaste q6  -On torsemide   ESRD on PD -Neprho consulted    IDDM - not receiving basal insulin, BG's look ok on SSI alone - will hold basal insulin for now, follow    COPD -No acute concern, continue as needed albuterol     DVT prophylaxis: heparin gtt Code Status: full Family Communication: none Disposition:   Status is: Inpatient Remains inpatient appropriate because: need for CT surgery eval   Consultants:  Cardiology CT surgery renal  Procedures:  Echo IMPRESSIONS     1. Left ventricular ejection fraction, by estimation, is 50 to 55%. The  left ventricle has low normal function. The left ventricle demonstrates  regional wall motion abnormalities (see scoring diagram/findings for  description). There is mild left  ventricular hypertrophy. Left ventricular diastolic parameters are  indeterminate.   2. Right ventricular systolic function is normal. The right ventricular   size is normal.   3. The mitral valve is normal in structure. Mild mitral valve  regurgitation. No evidence of mitral stenosis.   4. The aortic valve is calcified. There is mild calcification of the  aortic valve. Aortic valve regurgitation is mild. Aortic valve sclerosis  is present, with no evidence of aortic valve stenosis. Aortic valve mean  gradient measures 9.2 mmHg. Aortic  valve Vmax measures 2.16 m/s.   5. The inferior vena cava is normal in size with greater than 50%  respiratory variability, suggesting right atrial pressure of 3 mmHg.   LHC 2nd Diag lesion is 30% stenosed.   RV Branch lesion is 90% stenosed.   RPDA lesion is 80% stenosed.   Mid RCA-1 lesion is 90% stenosed.   Mid RCA-2 lesion is 90% stenosed.   Dist RCA lesion is 75% stenosed.   Prox LAD lesion is 75% stenosed.   Mid Cx lesion is 50% stenosed.   Non-stenotic Prox RCA lesion was previously treated.   LV end diastolic pressure is mildly elevated.   There is no aortic valve stenosis.   Aortic saturation 97%, PA saturation 65%, PA pressure 35/16, mean PA pressure 24 mmHg, mean pulmonary capillary wedge pressure 17 mmHg, right atrial pressure 4/7 mmHg, cardiac output 6.94 L/min, cardiac index 3.43.   Severe in-stent restenosis in the mid RCA.  Denovo distal RCA lesion.  Back in July 2023, there was [name] [name] moderate lesion but this has increased significantly. Proximal LAD lesion worsened as well compared to July 2023.  There is now an irregular 75% stenosis. Essentially normal right heart  pressures.   Given multiple episodes of restenosis, repeat PCI will not likely given the long-term benefit.  Now with proximal LAD lesion which has changed in the last few months, will get cardiac surgery consult.  Will hold antiplatelet therapy in the event that he is Marsa Matteo candidate for surgery.  May need to start IV cangrelor for antiplatelet therapy.   Antimicrobials:  Anti-infectives (From admission, onward)    None        Subjective: Continued CP with exertion  Objective: Vitals:   05/31/22 0349 05/31/22 2016 06/01/22 1627 06/01/22 1700  BP: 120/72 (!) 145/75 116/64 124/62  Pulse:  60  (!) 49  Resp: '17 20  18  '$ Temp: (!) 97.4 F (36.3 C) (!) 97.3 F (36.3 C)  97.6 F (36.4 C)  TempSrc: Oral Oral  Oral  SpO2: 96% 98%  91%  Weight:      Height:        Intake/Output Summary (Last 24 hours) at 06/01/2022 1823 Last data filed at 06/01/2022 0908 Gross per 24 hour  Intake 521.86 ml  Output 1154 ml  Net -632.14 ml   Filed Weights   05/30/22 0346  Weight: 98.4 kg    Examination:  General: No acute distress. Cardiovascular: Heart sounds show Lakina Mcintire regular rate, and rhythm. Lungs: Clear to auscultation bilaterally  Abdomen: Soft, nontender, nondistended  Neurological: Alert and oriented 3. Moves all extremities 4 with equal strength. Cranial nerves II through XII grossly intact. Extremities: No clubbing or cyanosis. No edema.   Data Reviewed: I have personally reviewed following labs and imaging studies  CBC: Recent Labs  Lab 05/30/22 0341 05/30/22 1507 05/30/22 1513 05/30/22 1517 05/31/22 0252 06/01/22 0212  WBC 4.4  --   --   --  5.3 5.6  NEUTROABS 3.3  --   --   --   --  4.4  HGB 8.9* 8.5* 8.5* 8.8* 9.5* 9.0*  HCT 26.8* 25.0* 25.0* 26.0* 28.8* 27.8*  MCV 94.0  --   --   --  92.9 93.3  PLT 126*  --   --   --  115* 105*    Basic Metabolic Panel: Recent Labs  Lab 05/30/22 0341 05/30/22 1148 05/30/22 1507 05/30/22 1513 05/30/22 1517 05/31/22 0252 06/01/22 0212  NA 136  --  135 135 135 136 136  K 3.6  --  3.5 3.5 3.5 4.0 3.9  CL 96*  --   --   --   --  98 96*  CO2 26  --   --   --   --  27 26  GLUCOSE 128*  --   --   --   --  101* 121*  BUN 44*  --   --   --   --  48* 50*  CREATININE 17.29*  --   --   --   --  19.27* 19.46*  CALCIUM 9.9  --   --   --   --  9.4 9.3  MG  --  2.1  --   --   --   --  2.0  PHOS  --   --   --   --   --   --  3.3    GFR: Estimated  Creatinine Clearance: 3.7 mL/min (Jania Steinke) (by C-G formula based on SCr of 19.46 mg/dL (H)).  Liver Function Tests: Recent Labs  Lab 05/30/22 1148 06/01/22 0212  AST 14* 32  ALT 13 13  ALKPHOS 68 46  BILITOT 0.3  0.4  PROT 6.4* 5.8*  ALBUMIN 3.0* 2.6*    CBG: Recent Labs  Lab 05/31/22 2308 06/01/22 0513 06/01/22 0811 06/01/22 1145 06/01/22 1610  GLUCAP 127* 102* 90 130* 80     No results found for this or any previous visit (from the past 240 hour(s)).       Radiology Studies: VAS US DOPPLER PRE CABG  Result Date: 06/01/2022 PREOPERATIVE VASCULAR EVALUATION Patient Name:  PERRIS CONWELL  Date of Exam:   06/01/2022 Medical Rec #: [id number]       Accession #:    [id number] Date of Birth: [birth year]      Patient Gender: M Patient Age:   68 years Exam Location:  [facility] Procedure:      VAS US DOPPLER PRE CABG Referring Phys: HARRELL LIGHTFOOT --------------------------------------------------------------------------------  Indications:            Pre-CABG. Risk Factors:           Hypertension, hyperlipidemia, Diabetes, coronary artery                         disease. Vascular Interventions: Left AVF. Performing Technologist: Archie Patten RVS  Examination Guidelines: Marcellus Pulliam complete evaluation includes B-mode imaging, spectral Doppler, color Doppler, and power Doppler as needed of all accessible portions of each vessel. Bilateral testing is considered an integral part of Justene Jensen complete examination. Limited examinations for reoccurring indications may be performed as noted.  Right Carotid Findings: +----------+--------+--------+--------+------------+--------+           PSV cm/sEDV cm/sStenosisDescribe    Comments +----------+--------+--------+--------+------------+--------+ CCA Prox  88      22              heterogenous         +----------+--------+--------+--------+------------+--------+ CCA Distal89      20              heterogenous          +----------+--------+--------+--------+------------+--------+ ICA Prox  101     24      1-39%   heterogenous         +----------+--------+--------+--------+------------+--------+ ICA Distal59      23                                   +----------+--------+--------+--------+------------+--------+ ECA       244                                          +----------+--------+--------+--------+------------+--------+ +----------+--------+-------+--------+------------+           PSV cm/sEDV cmsDescribeArm Pressure +----------+--------+-------+--------+------------+ Subclavian189                                 +----------+--------+-------+--------+------------+ +---------+--------+--+--------+--+---------+ VertebralPSV cm/s48EDV cm/s15Antegrade +---------+--------+--+--------+--+---------+ Left Carotid Findings: +----------+--------+--------+--------+------------+---------+           PSV cm/sEDV cm/sStenosisDescribe    Comments  +----------+--------+--------+--------+------------+---------+ CCA Prox  96      25              heterogenous          +----------+--------+--------+--------+------------+---------+ CCA Distal78      20              heterogenous          +----------+--------+--------+--------+------------+---------+  ICA Prox  65      24      1-39%   heterogenousShadowing +----------+--------+--------+--------+------------+---------+ ICA Distal90      33                                    +----------+--------+--------+--------+------------+---------+ ECA       157     23                                    +----------+--------+--------+--------+------------+---------+ +----------+--------+--------+--------+------------+ SubclavianPSV cm/sEDV cm/sDescribeArm Pressure +----------+--------+--------+--------+------------+           101                                  +----------+--------+--------+--------+------------+  +---------+--------+--+--------+--+---------+ VertebralPSV cm/s39EDV cm/s17Antegrade +---------+--------+--+--------+--+---------+  ABI Findings: +---------+------------------+-----+---------+--------+ Right    Rt Pressure (mmHg)IndexWaveform Comment  +---------+------------------+-----+---------+--------+ Brachial 123                    triphasic         +---------+------------------+-----+---------+--------+ PTA      119               0.97 triphasic         +---------+------------------+-----+---------+--------+ DP       114               0.93 triphasic         +---------+------------------+-----+---------+--------+ Audrie Lia               0.97 Normal            +---------+------------------+-----+---------+--------+ +---------+------------------+-----+---------+-------+ Left     Lt Pressure (mmHg)IndexWaveform Comment +---------+------------------+-----+---------+-------+ Brachial                        triphasicAVF     +---------+------------------+-----+---------+-------+ PTA      124               1.01 triphasic        +---------+------------------+-----+---------+-------+ DP       103               0.84 triphasic        +---------+------------------+-----+---------+-------+ Great Toe109               0.89 Normal           +---------+------------------+-----+---------+-------+ +-------+---------------+----------------+ ABI/TBIToday's ABI/TBIPrevious ABI/TBI +-------+---------------+----------------+ Right  0.97           0.97             +-------+---------------+----------------+ Left   1.01           0.89             +-------+---------------+----------------+  Right Doppler Findings: +--------+--------+-----+---------+--------+ Site    PressureIndexDoppler  Comments +--------+--------+-----+---------+--------+ [id number]          triphasic         +--------+--------+-----+---------+--------+ Radial                triphasic         +--------+--------+-----+---------+--------+ Ulnar                triphasic         +--------+--------+-----+---------+--------+  Left Doppler Findings: +--------+--------+-----+---------+--------+ Site    PressureIndexDoppler  Comments +--------+--------+-----+---------+--------+  Brachial             triphasicAVF      +--------+--------+-----+---------+--------+ Radial               triphasic         +--------+--------+-----+---------+--------+ Ulnar                triphasic         +--------+--------+-----+---------+--------+   Summary: Right Carotid: Velocities in the right ICA are consistent with Versa Craton 1-39% stenosis. Left Carotid: Velocities in the left ICA are consistent with Jada Fass 1-39% stenosis. Vertebrals: Bilateral vertebral arteries demonstrate antegrade flow. Right ABI: Resting right ankle-brachial index is within normal range. Left ABI: Resting left ankle-brachial index is within normal range. Right Upper Extremity: Doppler waveform obliterate with right radial compression. Doppler waveforms remain within normal limits with right ulnar compression. Left Upper Extremity: Doppler waveforms decrease 50% with left radial compression. Doppler waveforms remain within normal limits with left ulnar compression.    Preliminary    ECHOCARDIOGRAM COMPLETE  Result Date: 05/31/2022    ECHOCARDIOGRAM REPORT   Patient Name:   SOL ENGLERT Date of Exam: 05/31/2022 Medical Rec #:  [id number]      Height:       64.0 in Accession #:    [id number]     Weight:       217.0 lb Date of Birth:  10-May-1951     BSA:          2.025 m Patient Age:    55 years       BP:           120/72 mmHg Patient Gender: M              HR:           49 bpm. Exam Location:  Inpatient Procedure: 2D Echo, Cardiac Doppler, Color Doppler and Intracardiac            Opacification Agent Indications:    NSTEMI I21..4  History:        Patient has prior history of Echocardiogram examinations, most                  recent 01/19/2022. CAD, Previous Myocardial Infarction and Angina,                 Signs/Symptoms:Chest Pain; Risk Factors:Hypertension, Sleep                 Apnea, Diabetes and Dyslipidemia.  Sonographer:    Ronny Flurry Sonographer#2:  Clayton Lefort RDCS (AE) Referring Phys: [id number] Jettie Booze  Sonographer Comments: Image acquisition challenging due to respiratory motion. IMPRESSIONS  1. Left ventricular ejection fraction, by estimation, is 50 to 55%. The left ventricle has low normal function. The left ventricle demonstrates regional wall motion abnormalities (see scoring diagram/findings for description). There is mild left ventricular hypertrophy. Left ventricular diastolic parameters are indeterminate.  2. Right ventricular systolic function is normal. The right ventricular size is normal.  3. The mitral valve is normal in structure. Mild mitral valve regurgitation. No evidence of mitral stenosis.  4. The aortic valve is calcified. There is mild calcification of the aortic valve. Aortic valve regurgitation is mild. Aortic valve sclerosis is present, with no evidence of aortic valve stenosis. Aortic valve mean gradient measures 9.2 mmHg. Aortic valve Vmax measures 2.16 m/s.  5. The inferior vena cava is normal in size with greater than 50% respiratory variability, suggesting right atrial pressure of  3 mmHg. FINDINGS  Left Ventricle: Left ventricular ejection fraction, by estimation, is 50 to 55%. The left ventricle has low normal function. The left ventricle demonstrates regional wall motion abnormalities. Definity contrast agent was given IV to delineate the left ventricular endocardial borders. The left ventricular internal cavity size was normal in size. There is mild left ventricular hypertrophy. Left ventricular diastolic parameters are indeterminate.  LV Wall Scoring: The mid inferoseptal segment, apical septal segment, and apical inferior segment are akinetic. Right Ventricle: The right  ventricular size is normal. No increase in right ventricular wall thickness. Right ventricular systolic function is normal. Left Atrium: Left atrial size was normal in size. Right Atrium: Right atrial size was normal in size. Pericardium: There is no evidence of pericardial effusion. Mitral Valve: The mitral valve is normal in structure. Mild mitral valve regurgitation. No evidence of mitral valve stenosis. MV peak gradient, 18.5 mmHg. The mean mitral valve gradient is 6.0 mmHg. Tricuspid Valve: The tricuspid valve is normal in structure. Tricuspid valve regurgitation is mild . No evidence of tricuspid stenosis. Aortic Valve: The aortic valve is calcified. There is mild calcification of the aortic valve. Aortic valve regurgitation is mild. Aortic valve sclerosis is present, with no evidence of aortic valve stenosis. Aortic valve mean gradient measures 9.2 mmHg. Aortic valve peak gradient measures 18.7 mmHg. Aortic valve area, by VTI measures 1.54 cm. Pulmonic Valve: The pulmonic valve was normal in structure. Pulmonic valve regurgitation is not visualized. No evidence of pulmonic stenosis. Aorta: The aortic root is normal in size and structure. Venous: The inferior vena cava is normal in size with greater than 50% respiratory variability, suggesting right atrial pressure of 3 mmHg. IAS/Shunts: No atrial level shunt detected by color flow Doppler.  LEFT VENTRICLE PLAX 2D LVIDd:         5.70 cm     Diastology LVIDs:         3.60 cm     LV e' medial:    9.57 cm/s LV PW:         1.20 cm     LV E/e' medial:  14.9 LV IVS:        1.10 cm     LV e' lateral:   12.30 cm/s LVOT diam:     1.70 cm     LV E/e' lateral: 11.6 LV SV:         73 LV SV Index:   36 LVOT Area:     2.27 cm  LV Volumes (MOD) LV vol d, MOD A4C: 84.5 ml LV SV MOD A4C:     84.5 ml RIGHT VENTRICLE             IVC RV S prime:     15.70 cm/s  IVC diam: 2.10 cm TAPSE (M-mode): 2.9 cm LEFT ATRIUM             Index        RIGHT ATRIUM           Index LA diam:         4.90 cm 2.42 cm/m   RA Area:     19.20 cm LA Vol (A2C):   41.1 ml 20.29 ml/m  RA Volume:   50.80 ml  25.08 ml/m LA Vol (A4C):   56.5 ml 27.90 ml/m LA Biplane Vol: 48.4 ml 23.90 ml/m  AORTIC VALVE AV Area (Vmax):    1.52 cm AV Area (Vmean):   1.51 cm AV Area (VTI):  1.54 cm AV Vmax:           216.20 cm/s AV Vmean:          138.200 cm/s AV VTI:            0.472 m AV Peak Grad:      18.7 mmHg AV Mean Grad:      9.2 mmHg LVOT Vmax:         144.67 cm/s LVOT Vmean:        91.800 cm/s LVOT VTI:          0.320 m LVOT/AV VTI ratio: 0.68  AORTA Ao Root diam: 3.20 cm Ao Asc diam:  3.60 cm MITRAL VALVE                TRICUSPID VALVE MV Area VTI:  1.40 cm      TR Peak grad:   34.6 mmHg MV Peak grad: 18.5 mmHg     TR Vmax:        294.00 cm/s MV Mean grad: 6.0 mmHg MV Vmax:      2.15 m/s      SHUNTS MV Vmean:     104.0 cm/s    Systemic VTI:  0.32 m MR Peak grad: 94.1 mmHg     Systemic Diam: 1.70 cm MR Mean grad: 67.0 mmHg MR Vmax:      485.00 cm/s MR Vmean:     390.0 cm/s MV E velocity: 143.00 cm/s Candee Furbish MD Electronically signed by Candee Furbish MD Signature Date/Time: 05/31/2022/1:39:19 PM    Final         Scheduled Meds:  aspirin EC  81 mg Oral Daily   calcitRIOL  0.25 mcg Oral Daily   gentamicin cream  1 Application Topical Daily   insulin aspart  0-6 Units Subcutaneous TID WC   insulin glargine-yfgn  15 Units Subcutaneous Daily   linagliptin  5 mg Oral Daily   nitroGLYCERIN  1 inch Topical Q6H   pantoprazole  40 mg Oral BID AC   polyethylene glycol  17 g Oral BID   sevelamer carbonate  1,600 mg Oral TID WC   simvastatin  20 mg Oral q morning   sodium chloride flush  3 mL Intravenous Q12H   torsemide  100 mg Oral Daily   Continuous Infusions:  dialysis solution 1.5% low-MG/low-CA     dialysis solution 2.5% low-MG/low-CA     heparin 1,450 Units/hr (06/01/22 0957)     LOS: 2 days    Time spent: over 30 min    Fayrene Helper, MD [facility]   To contact the  attending provider between 7A-7P or the covering provider during after hours 7P-7A, please log into the web site [email or web address] and access using universal [place] password for that web site. If you do not have the password, please call the hospital operator.  06/01/2022, 6:23 PM

## 2022-06-02 NOTE — Progress Notes (Signed)
Patient refused CPAP HS we dont have the mask he wants to wear with CPAP.

## 2022-06-02 NOTE — Progress Notes (Signed)
Rounding Note    Patient Name: [name] [name] Date of Encounter: 06/02/2022  [place] Cardiologist: Pixie Casino, MD   Subjective   No further CP this AM; no dyspnea  Inpatient Medications    Scheduled Meds:  aspirin EC  81 mg Oral Daily   calcitRIOL  0.25 mcg Oral Daily   gentamicin cream  1 Application Topical Daily   insulin aspart  0-6 Units Subcutaneous TID WC   linagliptin  5 mg Oral Daily   nitroGLYCERIN  1 inch Topical Q6H   pantoprazole  40 mg Oral BID AC   polyethylene glycol  17 g Oral BID   sevelamer carbonate  1,600 mg Oral TID WC   simvastatin  20 mg Oral q morning   sodium chloride flush  3 mL Intravenous Q12H   torsemide  100 mg Oral Daily   Continuous Infusions:  dialysis solution 1.5% low-MG/low-CA     dialysis solution 2.5% low-MG/low-CA     heparin 1,450 Units/hr (06/01/22 1958)   PRN Meds: acetaminophen, albuterol, morphine injection, nitroGLYCERIN, ondansetron (ZOFRAN) IV, sodium chloride flush   Vital Signs    Vitals:   06/01/22 1700 06/01/22 1955 06/01/22 2153 06/02/22 0507  BP: 124/62 119/60 (!) 103/55 117/66  Pulse: (!) 49 (!) 51  (!) 48  Resp: '18 18  16  '$ Temp: 97.6 F (36.4 C) 97.8 F (36.6 C)  98.7 F (37.1 C)  TempSrc: Oral Oral  Oral  SpO2: 91% 92%  100%  Weight:    103.5 kg  Height:        Intake/Output Summary (Last 24 hours) at 06/02/2022 0758 Last data filed at 06/01/2022 2336 Gross per 24 hour  Intake 262.09 ml  Output 1154 ml  Net -891.91 ml       06/02/2022    5:07 AM 05/30/2022    3:46 AM 05/03/2022   10:21 AM  Last 3 Weights  Weight (lbs) 228 lb 2.8 oz 217 lb 220 lb 9.6 oz  Weight (kg) 103.5 kg 98.431 kg 100.064 kg      Telemetry    Sinus with intermittent Mobitz 1 second-degree AV block; no prolonged pauses- Personally Reviewed   Physical Exam   GEN: NAD WD WN Neck: No JVD Cardiac: irregular and bradycardic, no rub Respiratory: CTA, no wheeze GI: Soft, not tender MS: No  edema Neuro:  No focal findings Psych: Normal affect   Labs    High Sensitivity Troponin:   Recent Labs  Lab 05/30/22 0341 05/30/22 0640 05/30/22 1148  TROPONINIHS 100* 142* 330*      Chemistry Recent Labs  Lab 05/30/22 1148 05/30/22 1507 05/31/22 0252 06/01/22 0212 06/02/22 0312  NA  --    < > 136 136 134*  K  --    < > 4.0 3.9 3.9  CL  --   --  98 96* 94*  CO2  --   --  '27 26 26  '$ GLUCOSE  --   --  101* 121* 132*  BUN  --   --  48* 50* 47*  CREATININE  --   --  19.27* 19.46* 19.06*  CALCIUM  --   --  9.4 9.3 9.0  MG 2.1  --   --  2.0 2.0  PROT 6.4*  --   --  5.8* 5.9*  ALBUMIN 3.0*  --   --  2.6* 2.7*  AST 14*  --   --  32 24  ALT 13  --   --  13 14  ALKPHOS 68  --   --  46 42  BILITOT 0.3  --   --  0.4 0.4  GFRNONAA  --   --  2* 2* 2*  ANIONGAP  --   --  '11 14 14   '$ < > = values in this interval not displayed.      Hematology Recent Labs  Lab 05/31/22 0252 06/01/22 0212 06/02/22 0312  WBC 5.3 5.6 4.9  RBC 3.10* 2.98* 2.85*  HGB 9.5* 9.0* 8.5*  HCT 28.8* 27.8* 27.2*  MCV 92.9 93.3 95.4  MCH 30.6 30.2 29.8  MCHC 33.0 32.4 31.3  RDW 14.9 14.9 14.9  PLT 115* 105* 106*    Thyroid  Recent Labs  Lab 05/30/22 1148  TSH 2.033      Radiology    VAS US DOPPLER PRE CABG  Result Date: 06/01/2022 PREOPERATIVE VASCULAR EVALUATION Patient Name:  [name] [name]  Date of Exam:   06/01/2022 Medical Rec #: [id number]       Accession #:    [id number] Date of Birth: [birth year]      Patient Gender: M Patient Age:   71 years Exam Location:  [facility] Procedure:      VAS US DOPPLER PRE CABG Referring Phys: HARRELL LIGHTFOOT --------------------------------------------------------------------------------  Indications:            Pre-CABG. Risk Factors:           Hypertension, hyperlipidemia, Diabetes, coronary artery                         disease. Vascular Interventions: Left AVF. Performing Technologist: Archie Patten RVS  Examination Guidelines: A  complete evaluation includes B-mode imaging, spectral Doppler, color Doppler, and power Doppler as needed of all accessible portions of each vessel. Bilateral testing is considered an integral part of a complete examination. Limited examinations for reoccurring indications may be performed as noted.  Right Carotid Findings: +----------+--------+--------+--------+------------+--------+           PSV cm/sEDV cm/sStenosisDescribe    Comments +----------+--------+--------+--------+------------+--------+ CCA Prox  88      22              heterogenous         +----------+--------+--------+--------+------------+--------+ CCA Distal89      20              heterogenous         +----------+--------+--------+--------+------------+--------+ ICA Prox  101     24      1-39%   heterogenous         +----------+--------+--------+--------+------------+--------+ ICA Distal59      23                                   +----------+--------+--------+--------+------------+--------+ ECA       244                                          +----------+--------+--------+--------+------------+--------+ +----------+--------+-------+--------+------------+           PSV cm/sEDV cmsDescribeArm Pressure +----------+--------+-------+--------+------------+ Subclavian189                                 +----------+--------+-------+--------+------------+ +---------+--------+--+--------+--+---------+ VertebralPSV cm/s48EDV cm/s15Antegrade +---------+--------+--+--------+--+---------+ Left Carotid Findings: +----------+--------+--------+--------+------------+---------+  PSV cm/sEDV cm/sStenosisDescribe    Comments  +----------+--------+--------+--------+------------+---------+ CCA Prox  96      25              heterogenous          +----------+--------+--------+--------+------------+---------+ CCA Distal78      20              heterogenous           +----------+--------+--------+--------+------------+---------+ ICA Prox  65      24      1-39%   heterogenousShadowing +----------+--------+--------+--------+------------+---------+ ICA Distal90      33                                    +----------+--------+--------+--------+------------+---------+ ECA       157     23                                    +----------+--------+--------+--------+------------+---------+ +----------+--------+--------+--------+------------+ SubclavianPSV cm/sEDV cm/sDescribeArm Pressure +----------+--------+--------+--------+------------+           101                                  +----------+--------+--------+--------+------------+ +---------+--------+--+--------+--+---------+ VertebralPSV cm/s39EDV cm/s17Antegrade +---------+--------+--+--------+--+---------+  ABI Findings: +---------+------------------+-----+---------+--------+ Right    Rt Pressure (mmHg)IndexWaveform Comment  +---------+------------------+-----+---------+--------+ Brachial 123                    triphasic         +---------+------------------+-----+---------+--------+ PTA      119               0.97 triphasic         +---------+------------------+-----+---------+--------+ DP       114               0.93 triphasic         +---------+------------------+-----+---------+--------+ Great Toe119               0.97 Normal            +---------+------------------+-----+---------+--------+ +---------+------------------+-----+---------+-------+ Left     Lt Pressure (mmHg)IndexWaveform Comment +---------+------------------+-----+---------+-------+ Brachial                        triphasicAVF     +---------+------------------+-----+---------+-------+ PTA      124               1.01 triphasic        +---------+------------------+-----+---------+-------+ DP       103               0.84 triphasic         +---------+------------------+-----+---------+-------+ Great Toe109               0.89 Normal           +---------+------------------+-----+---------+-------+ +-------+---------------+----------------+ ABI/TBIToday's ABI/TBIPrevious ABI/TBI +-------+---------------+----------------+ Right  0.97           0.97             +-------+---------------+----------------+ Left   1.01           0.89             +-------+---------------+----------------+  Right Doppler Findings: +--------+--------+-----+---------+--------+ Site    PressureIndexDoppler  Comments +--------+--------+-----+---------+--------+ [id number]  triphasic         +--------+--------+-----+---------+--------+ Radial               triphasic         +--------+--------+-----+---------+--------+ Ulnar                triphasic         +--------+--------+-----+---------+--------+  Left Doppler Findings: +--------+--------+-----+---------+--------+ Site    PressureIndexDoppler  Comments +--------+--------+-----+---------+--------+ Brachial             triphasicAVF      +--------+--------+-----+---------+--------+ Radial               triphasic         +--------+--------+-----+---------+--------+ Ulnar                triphasic         +--------+--------+-----+---------+--------+   Summary: Right Carotid: Velocities in the right ICA are consistent with a 1-39% stenosis. Left Carotid: Velocities in the left ICA are consistent with a 1-39% stenosis. Vertebrals: Bilateral vertebral arteries demonstrate antegrade flow. Right ABI: Resting right ankle-brachial index is within normal range. Left ABI: Resting left ankle-brachial index is within normal range. Right Upper Extremity: Doppler waveform obliterate with right radial compression. Doppler waveforms remain within normal limits with right ulnar compression. Left Upper Extremity: Doppler waveforms decrease 50% with left radial compression.  Doppler waveforms remain within normal limits with left ulnar compression.    Preliminary    ECHOCARDIOGRAM COMPLETE  Result Date: 05/31/2022    ECHOCARDIOGRAM REPORT   Patient Name:   [name] [name] Date of Exam: 05/31/2022 Medical Rec #:  [id number]      Height:       64.0 in Accession #:    [id number]     Weight:       217.0 lb Date of Birth:  November 09, 1950     BSA:          2.025 m Patient Age:    63 years       BP:           120/72 mmHg Patient Gender: M              HR:           49 bpm. Exam Location:  Inpatient Procedure: 2D Echo, Cardiac Doppler, Color Doppler and Intracardiac            Opacification Agent Indications:    NSTEMI I21..4  History:        Patient has prior history of Echocardiogram examinations, most                 recent 01/19/2022. CAD, Previous Myocardial Infarction and Angina,                 Signs/Symptoms:Chest Pain; Risk Factors:Hypertension, Sleep                 Apnea, Diabetes and Dyslipidemia.  Sonographer:    Ronny Flurry Sonographer#2:  Clayton Lefort RDCS (AE) Referring Phys: [id number] Jettie Booze  Sonographer Comments: Image acquisition challenging due to respiratory motion. IMPRESSIONS  1. Left ventricular ejection fraction, by estimation, is 50 to 55%. The left ventricle has low normal function. The left ventricle demonstrates regional wall motion abnormalities (see scoring diagram/findings for description). There is mild left ventricular hypertrophy. Left ventricular diastolic parameters are indeterminate.  2. Right ventricular systolic function is normal. The right ventricular size is normal.  3. The mitral valve is normal in structure. Mild  mitral valve regurgitation. No evidence of mitral stenosis.  4. The aortic valve is calcified. There is mild calcification of the aortic valve. Aortic valve regurgitation is mild. Aortic valve sclerosis is present, with no evidence of aortic valve stenosis. Aortic valve mean gradient measures 9.2 mmHg. Aortic valve Vmax measures  2.16 m/s.  5. The inferior vena cava is normal in size with greater than 50% respiratory variability, suggesting right atrial pressure of 3 mmHg. FINDINGS  Left Ventricle: Left ventricular ejection fraction, by estimation, is 50 to 55%. The left ventricle has low normal function. The left ventricle demonstrates regional wall motion abnormalities. Definity contrast agent was given IV to delineate the left ventricular endocardial borders. The left ventricular internal cavity size was normal in size. There is mild left ventricular hypertrophy. Left ventricular diastolic parameters are indeterminate.  LV Wall Scoring: The mid inferoseptal segment, apical septal segment, and apical inferior segment are akinetic. Right Ventricle: The right ventricular size is normal. No increase in right ventricular wall thickness. Right ventricular systolic function is normal. Left Atrium: Left atrial size was normal in size. Right Atrium: Right atrial size was normal in size. Pericardium: There is no evidence of pericardial effusion. Mitral Valve: The mitral valve is normal in structure. Mild mitral valve regurgitation. No evidence of mitral valve stenosis. MV peak gradient, 18.5 mmHg. The mean mitral valve gradient is 6.0 mmHg. Tricuspid Valve: The tricuspid valve is normal in structure. Tricuspid valve regurgitation is mild . No evidence of tricuspid stenosis. Aortic Valve: The aortic valve is calcified. There is mild calcification of the aortic valve. Aortic valve regurgitation is mild. Aortic valve sclerosis is present, with no evidence of aortic valve stenosis. Aortic valve mean gradient measures 9.2 mmHg. Aortic valve peak gradient measures 18.7 mmHg. Aortic valve area, by VTI measures 1.54 cm. Pulmonic Valve: The pulmonic valve was normal in structure. Pulmonic valve regurgitation is not visualized. No evidence of pulmonic stenosis. Aorta: The aortic root is normal in size and structure. Venous: The inferior vena cava is normal  in size with greater than 50% respiratory variability, suggesting right atrial pressure of 3 mmHg. IAS/Shunts: No atrial level shunt detected by color flow Doppler.  LEFT VENTRICLE PLAX 2D LVIDd:         5.70 cm     Diastology LVIDs:         3.60 cm     LV e' medial:    9.57 cm/s LV PW:         1.20 cm     LV E/e' medial:  14.9 LV IVS:        1.10 cm     LV e' lateral:   12.30 cm/s LVOT diam:     1.70 cm     LV E/e' lateral: 11.6 LV SV:         73 LV SV Index:   36 LVOT Area:     2.27 cm  LV Volumes (MOD) LV vol d, MOD A4C: 84.5 ml LV SV MOD A4C:     84.5 ml RIGHT VENTRICLE             IVC RV S prime:     15.70 cm/s  IVC diam: 2.10 cm TAPSE (M-mode): 2.9 cm LEFT ATRIUM             Index        RIGHT ATRIUM           Index LA diam:  4.90 cm 2.42 cm/m   RA Area:     19.20 cm LA Vol (A2C):   41.1 ml 20.29 ml/m  RA Volume:   50.80 ml  25.08 ml/m LA Vol (A4C):   56.5 ml 27.90 ml/m LA Biplane Vol: 48.4 ml 23.90 ml/m  AORTIC VALVE AV Area (Vmax):    1.52 cm AV Area (Vmean):   1.51 cm AV Area (VTI):     1.54 cm AV Vmax:           216.20 cm/s AV Vmean:          138.200 cm/s AV VTI:            0.472 m AV Peak Grad:      18.7 mmHg AV Mean Grad:      9.2 mmHg LVOT Vmax:         144.67 cm/s LVOT Vmean:        91.800 cm/s LVOT VTI:          0.320 m LVOT/AV VTI ratio: 0.68  AORTA Ao Root diam: 3.20 cm Ao Asc diam:  3.60 cm MITRAL VALVE                TRICUSPID VALVE MV Area VTI:  1.40 cm      TR Peak grad:   34.6 mmHg MV Peak grad: 18.5 mmHg     TR Vmax:        294.00 cm/s MV Mean grad: 6.0 mmHg MV Vmax:      2.15 m/s      SHUNTS MV Vmean:     104.0 cm/s    Systemic VTI:  0.32 m MR Peak grad: 94.1 mmHg     Systemic Diam: 1.70 cm MR Mean grad: 67.0 mmHg MR Vmax:      485.00 cm/s MR Vmean:     390.0 cm/s MV E velocity: 143.00 cm/s Candee Furbish MD Electronically signed by Candee Furbish MD Signature Date/Time: 05/31/2022/1:39:19 PM    Final      Patient Profile     71 year old male with past medical history of  diabetes mellitus, hypertension, hyperlipidemia, obstructive sleep apnea, end-stage renal disease on peritoneal dialysis, coronary artery disease with history of PCI of the right coronary artery for evaluation of unstable angina.  Cardiac catheterization revealed severe in-stent restenosis in the mid right coronary artery as well as the distal RCA lesion, 75% proximal LAD and 50% circumflex.  Echocardiogram shows normal LV function and mild aortic insufficiency.  Assessment & Plan    1 unstable angina/NSTEMI-cardiac catheterization revealed severe in-stent restenosis in the right coronary artery and also with proximal LAD disease.  Echocardiogram shows preserved LV function.  Plan is for coronary artery bypass and graft on Monday.  Continue present medications including aspirin, statin, heparin and nitroglycerin.  Patient is not on beta-blockade given baseline bradycardia.   2 Mobitz 1 second-degree AV block-this has been noted previously and he has had no prolonged pauses.  He did have complete heart block at night and saw Dr. Caryl Comes but patient apparently was not on CPAP and has been managed conservatively.  No indication for pacemaker at present but likely will require in the future.  Continue telemetry.  3 end-stage renal disease-peritoneal dialysis dependent. Nephrology following.  4 hyperlipidemia-continue statin.  5 obstructive sleep apnea-continue CPAP.  For questions or updates, please contact [place] Please consult [email or web address] for contact info under        Signed, Kirk Ruths, MD  06/02/2022, 7:58 AM

## 2022-06-02 NOTE — Progress Notes (Addendum)
Patient PD treatment started at 1830. After first fill. Patient c/o unable to breathing. I Used manual to taking off 2.5 L. Pt got better.Dr. Carolin Sicks notified and order received as follow : change fill volume to 2 L. Treatment will restart at 21:00.

## 2022-06-02 NOTE — Care Management Important Message (Signed)
Important Message  Patient Details  Name: [name] [name] MRN: [id number] Date of Birth: [birth year]   Medicare Important Message Given:  Yes     [name] 06/02/2022, 9:31 AM

## 2022-06-03 DIAGNOSIS — I214 Non-ST elevation (NSTEMI) myocardial infarction: Secondary | ICD-10-CM | POA: Diagnosis not present

## 2022-06-03 LAB — CBC WITH DIFFERENTIAL/PLATELET
Abs Immature Granulocytes: 0.02 10*3/uL (ref 0.00–0.07)
Basophils Absolute: 0 10*3/uL (ref 0.0–0.1)
Basophils Relative: 1 %
Eosinophils Absolute: 0.1 10*3/uL (ref 0.0–0.5)
Eosinophils Relative: 3 %
HCT: 25 % — ABNORMAL LOW (ref 39.0–52.0)
Hemoglobin: 8.3 g/dL — ABNORMAL LOW (ref 13.0–17.0)
Immature Granulocytes: 0 %
Lymphocytes Relative: 10 %
Lymphs Abs: 0.5 10*3/uL — ABNORMAL LOW (ref 0.7–4.0)
MCH: 31.2 pg (ref 26.0–34.0)
MCHC: 33.2 g/dL (ref 30.0–36.0)
MCV: 94 fL (ref 80.0–100.0)
Monocytes Absolute: 0.5 10*3/uL (ref 0.1–1.0)
Monocytes Relative: 9 %
Neutro Abs: 4 10*3/uL (ref 1.7–7.7)
Neutrophils Relative %: 77 %
Platelets: 90 10*3/uL — ABNORMAL LOW (ref 150–400)
RBC: 2.66 MIL/uL — ABNORMAL LOW (ref 4.22–5.81)
RDW: 14.9 % (ref 11.5–15.5)
WBC: 5.1 10*3/uL (ref 4.0–10.5)
nRBC: 0.4 % — ABNORMAL HIGH (ref 0.0–0.2)

## 2022-06-03 LAB — COMPREHENSIVE METABOLIC PANEL
ALT: 14 U/L (ref 0–44)
AST: 20 U/L (ref 15–41)
Albumin: 2.7 g/dL — ABNORMAL LOW (ref 3.5–5.0)
Alkaline Phosphatase: 35 U/L — ABNORMAL LOW (ref 38–126)
Anion gap: 14 (ref 5–15)
BUN: 50 mg/dL — ABNORMAL HIGH (ref 8–23)
CO2: 25 mmol/L (ref 22–32)
Calcium: 9.2 mg/dL (ref 8.9–10.3)
Chloride: 98 mmol/L (ref 98–111)
Creatinine, Ser: 20.4 mg/dL — ABNORMAL HIGH (ref 0.61–1.24)
GFR, Estimated: 2 mL/min — ABNORMAL LOW (ref >60)
Glucose, Bld: 127 mg/dL — ABNORMAL HIGH (ref 70–99)
Potassium: 4.1 mmol/L (ref 3.5–5.1)
Sodium: 137 mmol/L (ref 135–145)
Total Bilirubin: 0.3 mg/dL (ref 0.3–1.2)
Total Protein: 5.9 g/dL — ABNORMAL LOW (ref 6.5–8.1)

## 2022-06-03 LAB — PHOSPHORUS: Phosphorus: 4 mg/dL (ref 2.5–4.6)

## 2022-06-03 LAB — LIPID PANEL
Cholesterol: 88 mg/dL (ref 0–200)
HDL: 32 mg/dL — ABNORMAL LOW (ref >40)
LDL Cholesterol: 36 mg/dL (ref 0–99)
Total CHOL/HDL Ratio: 2.8 RATIO
Triglycerides: 98 mg/dL (ref <150)
VLDL: 20 mg/dL (ref 0–40)

## 2022-06-03 LAB — HEPARIN LEVEL (UNFRACTIONATED): Heparin Unfractionated: 0.29 IU/mL — ABNORMAL LOW (ref 0.30–0.70)

## 2022-06-03 LAB — GLUCOSE, CAPILLARY
Glucose-Capillary: 113 mg/dL — ABNORMAL HIGH (ref 70–99)
Glucose-Capillary: 128 mg/dL — ABNORMAL HIGH (ref 70–99)
Glucose-Capillary: 119 mg/dL — ABNORMAL HIGH (ref 70–99)
Glucose-Capillary: 163 mg/dL — ABNORMAL HIGH (ref 70–99)

## 2022-06-03 LAB — MAGNESIUM: Magnesium: 2.1 mg/dL (ref 1.7–2.4)

## 2022-06-03 MED ORDER — OXYMETAZOLINE HCL 0.05 % NA SOLN
1.0000 | Freq: Two times a day (BID) | NASAL | Status: DC
  Administered 2022-06-03 – 2022-06-04 (×3): 1 via NASAL
  Filled 2022-06-03: qty 30

## 2022-06-03 NOTE — Progress Notes (Signed)
Pt was complaining of 7/10 CP with exertion when ambulating in the room. This RN arrived and pt stated that his CP was 4/10, 1x SL Nitro given, 12-lead EKG obtained. Pt's CP subsided, BP 112/64 MAP 78. Will continue to monitor.   Elaina Hoops, RN

## 2022-06-03 NOTE — Progress Notes (Signed)
[facility] NEPHROLOGY PROGRESS NOTE  Assessment/ Plan: Pt is a 71 y.o. yo male   with past medical history significant for OSA, COPD, type II DM, CAD, ESRD on PD, anemia who was presented with chest pain, and NSTEMI, seen as a consultation for the management of ESRD.   Dialysis Orders from previous admission in 01/2022.: [facility] therapies.  CCPD, 5 exchanges 3 liter fill volumes, 10 min fill time, 1 hour 20 minutes dwell, 20 minute drain.  Last fill Icodextran.  EDW 103 kg. Uses combination of 1.5% and 2.5% depending upon BP and weight    #NSTEMI/unstable angina: Cardiac cath on 11/14 with severe in in-stent restenosis in the right coronary artery and proximal LAD disease.  Currently on heparin and cardiac medication.  Plan for CABG on Monday.     # ESRD on PD: Continue CCPD at night, 5 exchanges, 2-2.5 L fill volume, mix of 1.5 and 2.5% solution.  He does have left upper extremity AV fistula.  We will try to obtain outpatient record from outpatient unit.  Patient was complaining of abdominal distention and difficulty with 2.5 L of fluid volume last night.  We lower fill volume to 2 L and he tolerated well.  Not much UF, reports good BM.  I will continue 2 L fill volume and 5 exchanges tonight.    # Hypertension: Blood pressure acceptable.  No edema.  Continue current cardiac medication.   # Anemia of ESRD: Hemoglobin around 9.  Iron saturation 27 with high ferritin.  Received Aranesp on 11/17.Marland Kitchen  #CKD-MBD: Calcium and phosphorus level acceptable.  Currently on sevelamer and calcitriol. Discussed with the dialysis nurse. Subjective: Seen and examined.  Overnight had abdominal distention and discomfort therefore field volume was lowered to 2 L.  Tolerated well.  This morning he denies nausea, vomiting, chest pain, shortness of breath.  Reports good BM.  Objective Vital signs in last 24 hours: Vitals:   06/02/22 0507 06/02/22 1156 06/02/22 2252 06/03/22 0518  BP:  117/66 99/64 (!) 112/50 (!) 103/47  Pulse: (!) 48 (!) 47 (!) 49 (!) 46  Resp: '16 16 18 20  '$ Temp: 98.7 F (37.1 C) 98.5 F (36.9 C) 98.6 F (37 C) 97.9 F (36.6 C)  TempSrc: Oral Oral Oral Axillary  SpO2: 100% 98% 96% 99%  Weight: 103.5 kg     Height:       Weight change:   Intake/Output Summary (Last 24 hours) at 06/03/2022 0930 Last data filed at 06/02/2022 2320 Gross per 24 hour  Intake 548.86 ml  Output --  Net 548.86 ml        Labs: RENAL PANEL Recent Labs    07/24/21 0254 07/25/21 0201 07/29/21 1410 08/14/21 2152 12/14/21 0001 12/22/21 1017 12/27/21 0215 01/18/22 1149 01/18/22 1436 01/20/22 0210 01/21/22 0229 05/30/22 0341 05/30/22 1148 05/30/22 1507 05/30/22 1513 05/30/22 1517 05/31/22 0252 06/01/22 0212 06/02/22 0312 06/03/22 0648  NA 130*   < > 134* 130*   < > 136 133* 135  --  133* 134* 136  --  135 135 135 136 136 134* 137  K 3.3*   < > 4.0 3.4*   < > 4.6 3.6 3.9  --  3.5 3.7 3.6  --  3.5 3.5 3.5 4.0 3.9 3.9 4.1  CL 89*   < > 90* 91*   < > 92* 95* 94*  --  95* 95* 96*  --   --   --   --  98  96* 94* 98  CO2 26   < > 27 23   < > '28 29 29  '$ --  '24 27 26  '$ --   --   --   --  '27 26 26 25  '$ GLUCOSE 97   < > 124* 104*   < > 117* 134* 116*  --  130* 144* 128*  --   --   --   --  101* 121* 132* 127*  BUN 73*   < > 67* 50*   < > 58* 52* 40*  --  51* 47* 44*  --   --   --   --  48* 50* 47* 50*  CREATININE 20.21*   < > 20.22* 18.51*   < > 16.87* 17.44* 17.26* 17.08* 19.15* 18.48* 17.29*  --   --   --   --  19.27* 19.46* 19.06* 20.40*  CALCIUM 9.0   < > 9.4 9.5   < > 10.5* 9.1 9.8  --  9.5 9.3 9.9  --   --   --   --  9.4 9.3 9.0 9.2  MG  --   --  2.4  --   --   --   --   --   --   --   --   --  2.1  --   --   --   --  2.0 2.0 2.1  PHOS 8.8*  --   --   --   --   --   --   --   --   --   --   --   --   --   --   --   --  3.3 3.3 4.0  ALBUMIN 3.0*  2.8*  --   --  2.7*  --   --   --  3.2*  --   --   --   --  3.0*  --   --   --   --  2.6* 2.7* 2.7*   < > = values  in this interval not displayed.      Liver Function Tests: Recent Labs  Lab 06/01/22 0212 06/02/22 0312 06/03/22 0648  AST 32 24 20  ALT '13 14 14  '$ ALKPHOS 46 42 35*  BILITOT 0.4 0.4 0.3  PROT 5.8* 5.9* 5.9*  ALBUMIN 2.6* 2.7* 2.7*    No results for input(s): "LIPASE", "AMYLASE" in the last 168 hours. No results for input(s): "AMMONIA" in the last 168 hours. CBC: Recent Labs    05/30/22 1517 05/31/22 0252 06/01/22 0212 06/02/22 0312 06/03/22 0648  HGB 8.8* 9.5* 9.0* 8.5* 8.3*  MCV  --  92.9 93.3 95.4 94.0  FERRITIN  --   --   --  1,105*  --   TIBC  --   --   --  293  --   IRON  --   --   --  79  --      Cardiac Enzymes: No results for input(s): "CKTOTAL", "CKMB", "CKMBINDEX", "TROPONINI" in the last 168 hours. CBG: Recent Labs  Lab 06/02/22 0757 06/02/22 1154 06/02/22 1615 06/02/22 2256 06/03/22 0741  GLUCAP 111* 130* 101* 139* 113*     Iron Studies:  Recent Labs    06/02/22 0312  IRON 79  TIBC 293  FERRITIN 1,105*    Studies/Results: VAS US DOPPLER PRE CABG  Result Date: 06/02/2022 PREOPERATIVE VASCULAR EVALUATION Patient Name:  LUCIA MCCREADIE  Date of Exam:  06/01/2022 Medical Rec #: [id number]       Accession #:    [id number] Date of Birth: 1951/05/01      Patient Gender: M Patient Age:   67 years Exam Location:  [facility] Procedure:      VAS US DOPPLER PRE CABG Referring Phys: HARRELL LIGHTFOOT --------------------------------------------------------------------------------  Indications:            Pre-CABG. Risk Factors:           Hypertension, hyperlipidemia, Diabetes, coronary artery                         disease. Vascular Interventions: Left AVF. Performing Technologist: Archie Patten RVS  Examination Guidelines: A complete evaluation includes B-mode imaging, spectral Doppler, color Doppler, and power Doppler as needed of all accessible portions of each vessel. Bilateral testing is considered an integral part of a complete  examination. Limited examinations for reoccurring indications may be performed as noted.  Right Carotid Findings: +----------+--------+--------+--------+------------+--------+           PSV cm/sEDV cm/sStenosisDescribe    Comments +----------+--------+--------+--------+------------+--------+ CCA Prox  88      22              heterogenous         +----------+--------+--------+--------+------------+--------+ CCA Distal89      20              heterogenous         +----------+--------+--------+--------+------------+--------+ ICA Prox  101     24      1-39%   heterogenous         +----------+--------+--------+--------+------------+--------+ ICA Distal59      23                                   +----------+--------+--------+--------+------------+--------+ ECA       244                                          +----------+--------+--------+--------+------------+--------+ +----------+--------+-------+--------+------------+           PSV cm/sEDV cmsDescribeArm Pressure +----------+--------+-------+--------+------------+ Subclavian189                                 +----------+--------+-------+--------+------------+ +---------+--------+--+--------+--+---------+ VertebralPSV cm/s48EDV cm/s15Antegrade +---------+--------+--+--------+--+---------+ Left Carotid Findings: +----------+--------+--------+--------+------------+---------+           PSV cm/sEDV cm/sStenosisDescribe    Comments  +----------+--------+--------+--------+------------+---------+ CCA Prox  96      25              heterogenous          +----------+--------+--------+--------+------------+---------+ CCA Distal78      20              heterogenous          +----------+--------+--------+--------+------------+---------+ ICA Prox  65      24      1-39%   heterogenousShadowing +----------+--------+--------+--------+------------+---------+ ICA Distal90      33                                     +----------+--------+--------+--------+------------+---------+ ECA       157     23                                    +----------+--------+--------+--------+------------+---------+ +----------+--------+--------+--------+------------+  SubclavianPSV cm/sEDV cm/sDescribeArm Pressure +----------+--------+--------+--------+------------+           101                                  +----------+--------+--------+--------+------------+ +---------+--------+--+--------+--+---------+ VertebralPSV cm/s39EDV cm/s17Antegrade +---------+--------+--+--------+--+---------+  ABI Findings: +---------+------------------+-----+---------+--------+ Right    Rt Pressure (mmHg)IndexWaveform Comment  +---------+------------------+-----+---------+--------+ Brachial 123                    triphasic         +---------+------------------+-----+---------+--------+ PTA      119               0.97 triphasic         +---------+------------------+-----+---------+--------+ DP       114               0.93 triphasic         +---------+------------------+-----+---------+--------+ Great Toe119               0.97 Normal            +---------+------------------+-----+---------+--------+ +---------+------------------+-----+---------+-------+ Left     Lt Pressure (mmHg)IndexWaveform Comment +---------+------------------+-----+---------+-------+ Brachial                        triphasicAVF     +---------+------------------+-----+---------+-------+ PTA      124               1.01 triphasic        +---------+------------------+-----+---------+-------+ DP       103               0.84 triphasic        +---------+------------------+-----+---------+-------+ Great Toe109               0.89 Normal           +---------+------------------+-----+---------+-------+ +-------+---------------+----------------+ ABI/TBIToday's ABI/TBIPrevious ABI/TBI  +-------+---------------+----------------+ Right  0.97           0.97             +-------+---------------+----------------+ Left   1.01           0.89             +-------+---------------+----------------+  Right Doppler Findings: +--------+--------+-----+---------+--------+ Site    PressureIndexDoppler  Comments +--------+--------+-----+---------+--------+ [id number]          triphasic         +--------+--------+-----+---------+--------+ Radial               triphasic         +--------+--------+-----+---------+--------+ Ulnar                triphasic         +--------+--------+-----+---------+--------+  Left Doppler Findings: +--------+--------+-----+---------+--------+ Site    PressureIndexDoppler  Comments +--------+--------+-----+---------+--------+ Brachial             triphasicAVF      +--------+--------+-----+---------+--------+ Radial               triphasic         +--------+--------+-----+---------+--------+ Ulnar                triphasic         +--------+--------+-----+---------+--------+   Summary: Right Carotid: Velocities in the right ICA are consistent with a 1-39% stenosis. Left Carotid: Velocities in the left ICA are consistent with a 1-39% stenosis. Vertebrals: Bilateral vertebral arteries demonstrate antegrade flow. Right ABI: Resting right ankle-brachial index is  within normal range. Left ABI: Resting left ankle-brachial index is within normal range. Right Upper Extremity: Doppler waveform obliterate with right radial compression. Doppler waveforms remain within normal limits with right ulnar compression. Left Upper Extremity: Doppler waveforms decrease 50% with left radial compression. Doppler waveforms remain within normal limits with left ulnar compression.  Electronically signed by Deitra Mayo MD on 06/02/2022 at 3:01:03 PM.    Final     Medications: Infusions:  dialysis solution 1.5% low-MG/low-CA     dialysis solution  2.5% low-MG/low-CA     heparin 1,650 Units/hr (06/03/22 0854)    Scheduled Medications:  aspirin EC  81 mg Oral Daily   calcitRIOL  0.25 mcg Oral Daily   gentamicin cream  1 Application Topical Daily   insulin aspart  0-6 Units Subcutaneous TID WC   linagliptin  5 mg Oral Daily   nitroGLYCERIN  1 inch Topical Q6H   pantoprazole  40 mg Oral BID AC   polyethylene glycol  17 g Oral BID   sevelamer carbonate  1,600 mg Oral TID WC   simvastatin  20 mg Oral q morning   sodium chloride flush  3 mL Intravenous Q12H   torsemide  100 mg Oral Daily    have reviewed scheduled and prn medications.  Physical Exam: General:NAD, comfortable Heart:RRR, s1s2 nl Lungs: Clear b/l, no crackle Abdomen:soft, Non-tender, non-distended Extremities:No leg edema Dialysis Access: PD catheter site clean, left upper extremity AV fistula has good thrill and bruit.  Taziyah Iannuzzi Prasad Verneta Hamidi 06/03/2022,9:30 AM  LOS: 4 days

## 2022-06-03 NOTE — Progress Notes (Addendum)
[place] for heparin Indication: chest pain/ACS and post-cath with cardiac surgery consult  Allergies  Allergen Reactions   Opana [Oxymorphone Hcl] Itching   Tramadol Itching    Patient Measurements: Height: '5\' 4"'$  (162.6 cm) Weight: 103.5 kg (228 lb 2.8 oz) IBW/kg (Calculated) : 59.2 Heparin Dosing Weight: 81.3kg  Vital Signs: Temp: 97.9 F (36.6 C) (11/18 0518) Temp Source: Axillary (11/18 0518) BP: 103/47 (11/18 0518) Pulse Rate: 46 (11/18 0518)  Labs: Recent Labs    06/01/22 0212 06/01/22 0747 06/02/22 0312 06/03/22 0648  HGB 9.0*  --  8.5* 8.3*  HCT 27.8*  --  27.2* 25.0*  PLT 105*  --  106* 90*  HEPARINUNFRC  --  0.20* 0.32 0.29*  CREATININE 19.46*  --  19.06* 20.40*     Estimated Creatinine Clearance: 3.6 mL/min (A) (by C-G formula based on SCr of 20.4 mg/dL (H)).  Medications:  Infusions:   dialysis solution 1.5% low-MG/low-CA     dialysis solution 2.5% low-MG/low-CA     heparin 1,500 Units/hr (06/02/22 1906)    Assessment: 67 yom with significant cardiac history including multiple episodes of restenosis presented to the ED with CP and was initiated on IV heparin. Pt s/p LHC with mvCAD, now planning for CABG next week. Heparin resumed post/cath.  Heparin level is just below goal at 0.29 this morning. Hgb 8.3, platelets 90. No issues with infusion reported, no signs/symptoms of bleeding reported.   Numerous difficulties obtaining heparin level samples so limiting lab draws. L arm is restricted and heparin is infusing via R AC and paused prior to lab collection.   Goal of Therapy:  Heparin level 0.3-0.7 units/ml Monitor platelets by anticoagulation protocol: Yes   Plan:  Increase heparin infusion to 1650 units/hr  Recheck heparin level with morning labs Monitor daily heparin levels, CBC, and signs/symptoms of bleeding  Louanne Belton, PharmD, [facility] PGY1 Pharmacy Resident 06/03/2022 8:27 AM

## 2022-06-03 NOTE — Progress Notes (Addendum)
[facility] PROGRESS NOTE  [name] [name] (DOB: [birth year]) LZJ:[id number] PCP: Carrolyn Meiers, MD  Brief Narrative: [name] [name] is a 71 y.o. male with a history of CAD w/chronic RCA ISR, ESRD on PD, AOCKD, COPD who presented to the ED on 05/30/2022 with chest pain, found to have obstructive CAD for which CABG is planned 11/20.  Subjective: Minor complaint is several weeks of left ear pain with chewing at times and muffled hearing in that ear. He actually says he's had similar symptoms "all my life." No PND, URI symptoms, or fever. No current chest pain.   Objective: BP (!) 105/57 (BP Location: Right Arm)   Pulse (!) 50   Temp 98.3 F (36.8 C) (Oral)   Resp 17   Ht '5\' 4"'$  (1.626 m)   Wt 103.5 kg   SpO2 97%   BMI 39.17 kg/m   Gen: No distress HEENT: Right ear normal. Left ear without tenderness to palpation of temple, mastoid or pinna. +tenderness at TMJ with possible dislocation on jaw ROM. Left TM is without inflammation, bubble superiorly suggesting clear effusion, no bulging Pulm: Clear, nonlabored  CV: Regular borderline bradycardia, no pitting edema GI: Soft, NT, ND, +BS, PD cath c/d/i Neuro: Alert and oriented. No new focal deficits. Ext: Warm, no deformities Skin: No rashes, lesions or ulcers on visualized skin   Assessment & Plan: NSTEMI, CAD with multivessel obstructive disease, HLD: LHC with severe in stent restenosis in mid RCA, de novo distal RCA lesion.  Proximal LAD lesion worsened.  - Consulted CT surgery, planning CABG 11/20. Preoperative work up per them.  - Continue heparin gtt, ASA '81mg'$ , statin,  - Bradycardia limits BB - Continue prn nitropaste  Left middle ear effusion: No evidence of infection.  - Afrin IN - Consider ENT referral if symptoms persistent.   ESRD:  - Continue PD per nephrology. Appreciate their assistance.  - Calcitriol, sevelamer  IDT2DM: HbA1c 5.9% indicating perhaps too tight of control - Continue SSI - Continue  home linagliptin - Holding basal insulin currently. Based on low A1c, and controlled CBGs while inpatient, would consider lowering/holding glargine 22u at discharge.    HTN: BPs are soft.  - Hold imdur - Continue UF with PD, torsemide, nitropaste q6h.   2nd degree AVB, Mobitz 1: Currently sinus bradycardia without pauses, has hx nocturnal CHB managed with Dr. Caryl Comes with reinitiation of CPAP.  - Cardiology suspected the patient will eventually require pacemaker, but no present indication.   COPD: Quiescent. Wears O2 as needed per PCCM note. Hx tobacco use, occupational WTC dust - prn BDs.  - Note PFTs 12/2019 ratio 74 FEV1 of 3 6 L (57% predicted), normal (pseudonormal) lung volumes, normal diffusion capacity.   OSA:  - Continue CPAP qHS  GERD:  - PPI  Obesity: Body mass index is 39.17 kg/m.   [name], MD [facility] [email or web address] 06/03/2022, 1:42 PM

## 2022-06-03 NOTE — Progress Notes (Signed)
Rounding Note    Patient Name: [name] [name] Date of Encounter: 06/03/2022  [place] Cardiologist: Pixie Casino, MD   Subjective   Denies CP or dyspnea  Inpatient Medications    Scheduled Meds:  aspirin EC  81 mg Oral Daily   calcitRIOL  0.25 mcg Oral Daily   gentamicin cream  1 Application Topical Daily   insulin aspart  0-6 Units Subcutaneous TID WC   linagliptin  5 mg Oral Daily   nitroGLYCERIN  1 inch Topical Q6H   pantoprazole  40 mg Oral BID AC   polyethylene glycol  17 g Oral BID   sevelamer carbonate  1,600 mg Oral TID WC   simvastatin  20 mg Oral q morning   sodium chloride flush  3 mL Intravenous Q12H   torsemide  100 mg Oral Daily   Continuous Infusions:  dialysis solution 1.5% low-MG/low-CA     dialysis solution 2.5% low-MG/low-CA     heparin 1,500 Units/hr (06/02/22 1906)   PRN Meds: acetaminophen, albuterol, morphine injection, nitroGLYCERIN, ondansetron (ZOFRAN) IV, sodium chloride flush   Vital Signs    Vitals:   06/02/22 0507 06/02/22 1156 06/02/22 2252 06/03/22 0518  BP: 117/66 99/64 (!) 112/50 (!) 103/47  Pulse: (!) 48 (!) 47 (!) 49 (!) 46  Resp: '16 16 18 20  '$ Temp: 98.7 F (37.1 C) 98.5 F (36.9 C) 98.6 F (37 C) 97.9 F (36.6 C)  TempSrc: Oral Oral Oral Axillary  SpO2: 100% 98% 96% 99%  Weight: 103.5 kg     Height:        Intake/Output Summary (Last 24 hours) at 06/03/2022 0606 Last data filed at 06/02/2022 2320 Gross per 24 hour  Intake 548.86 ml  Output 100 ml  Net 448.86 ml       06/02/2022    5:07 AM 05/30/2022    3:46 AM 05/03/2022   10:21 AM  Last 3 Weights  Weight (lbs) 228 lb 2.8 oz 217 lb 220 lb 9.6 oz  Weight (kg) 103.5 kg 98.431 kg 100.064 kg      Telemetry    Sinus bradycardia- Personally Reviewed   Physical Exam   GEN: No acute distress Neck: supple Cardiac: irregular and bradycardic Respiratory: CTA, no rhonchi GI: Soft, NT MS: No edema Neuro:  Grossly intact Psych: Normal  affect   Labs    High Sensitivity Troponin:   Recent Labs  Lab 05/30/22 0341 05/30/22 0640 05/30/22 1148  TROPONINIHS 100* 142* 330*      Chemistry Recent Labs  Lab 05/30/22 1148 05/30/22 1507 05/31/22 0252 06/01/22 0212 06/02/22 0312  NA  --    < > 136 136 134*  K  --    < > 4.0 3.9 3.9  CL  --   --  98 96* 94*  CO2  --   --  '27 26 26  '$ GLUCOSE  --   --  101* 121* 132*  BUN  --   --  48* 50* 47*  CREATININE  --   --  19.27* 19.46* 19.06*  CALCIUM  --   --  9.4 9.3 9.0  MG 2.1  --   --  2.0 2.0  PROT 6.4*  --   --  5.8* 5.9*  ALBUMIN 3.0*  --   --  2.6* 2.7*  AST 14*  --   --  32 24  ALT 13  --   --  13 14  ALKPHOS 68  --   --  46 42  BILITOT 0.3  --   --  0.4 0.4  GFRNONAA  --   --  2* 2* 2*  ANIONGAP  --   --  '11 14 14   '$ < > = values in this interval not displayed.      Hematology Recent Labs  Lab 05/31/22 0252 06/01/22 0212 06/02/22 0312  WBC 5.3 5.6 4.9  RBC 3.10* 2.98* 2.85*  HGB 9.5* 9.0* 8.5*  HCT 28.8* 27.8* 27.2*  MCV 92.9 93.3 95.4  MCH 30.6 30.2 29.8  MCHC 33.0 32.4 31.3  RDW 14.9 14.9 14.9  PLT 115* 105* 106*    Thyroid  Recent Labs  Lab 05/30/22 1148  TSH 2.033      Radiology    VAS US DOPPLER PRE CABG  Result Date: 06/02/2022 PREOPERATIVE VASCULAR EVALUATION Patient Name:  MATTHE SLOANE  Date of Exam:   06/01/2022 Medical Rec #: [id number]       Accession #:    [id number] Date of Birth: 1951-01-16      Patient Gender: M Patient Age:   29 years Exam Location:  [facility] Procedure:      VAS US DOPPLER PRE CABG Referring Phys: HARRELL LIGHTFOOT --------------------------------------------------------------------------------  Indications:            Pre-CABG. Risk Factors:           Hypertension, hyperlipidemia, Diabetes, coronary artery                         disease. Vascular Interventions: Left AVF. Performing Technologist: Archie Patten RVS  Examination Guidelines: A complete evaluation includes B-mode imaging, spectral  Doppler, color Doppler, and power Doppler as needed of all accessible portions of each vessel. Bilateral testing is considered an integral part of a complete examination. Limited examinations for reoccurring indications may be performed as noted.  Right Carotid Findings: +----------+--------+--------+--------+------------+--------+           PSV cm/sEDV cm/sStenosisDescribe    Comments +----------+--------+--------+--------+------------+--------+ CCA Prox  88      22              heterogenous         +----------+--------+--------+--------+------------+--------+ CCA Distal89      20              heterogenous         +----------+--------+--------+--------+------------+--------+ ICA Prox  101     24      1-39%   heterogenous         +----------+--------+--------+--------+------------+--------+ ICA Distal59      23                                   +----------+--------+--------+--------+------------+--------+ ECA       244                                          +----------+--------+--------+--------+------------+--------+ +----------+--------+-------+--------+------------+           PSV cm/sEDV cmsDescribeArm Pressure +----------+--------+-------+--------+------------+ Subclavian189                                 +----------+--------+-------+--------+------------+ +---------+--------+--+--------+--+---------+ VertebralPSV cm/s48EDV cm/s15Antegrade +---------+--------+--+--------+--+---------+ Left Carotid Findings: +----------+--------+--------+--------+------------+---------+           PSV  cm/sEDV cm/sStenosisDescribe    Comments  +----------+--------+--------+--------+------------+---------+ CCA Prox  96      25              heterogenous          +----------+--------+--------+--------+------------+---------+ CCA Distal78      20              heterogenous          +----------+--------+--------+--------+------------+---------+ ICA Prox   65      24      1-39%   heterogenousShadowing +----------+--------+--------+--------+------------+---------+ ICA Distal90      33                                    +----------+--------+--------+--------+------------+---------+ ECA       157     23                                    +----------+--------+--------+--------+------------+---------+ +----------+--------+--------+--------+------------+ SubclavianPSV cm/sEDV cm/sDescribeArm Pressure +----------+--------+--------+--------+------------+           101                                  +----------+--------+--------+--------+------------+ +---------+--------+--+--------+--+---------+ VertebralPSV cm/s39EDV cm/s17Antegrade +---------+--------+--+--------+--+---------+  ABI Findings: +---------+------------------+-----+---------+--------+ Right    Rt Pressure (mmHg)IndexWaveform Comment  +---------+------------------+-----+---------+--------+ Brachial 123                    triphasic         +---------+------------------+-----+---------+--------+ PTA      119               0.97 triphasic         +---------+------------------+-----+---------+--------+ DP       114               0.93 triphasic         +---------+------------------+-----+---------+--------+ Great Toe119               0.97 Normal            +---------+------------------+-----+---------+--------+ +---------+------------------+-----+---------+-------+ Left     Lt Pressure (mmHg)IndexWaveform Comment +---------+------------------+-----+---------+-------+ Brachial                        triphasicAVF     +---------+------------------+-----+---------+-------+ PTA      124               1.01 triphasic        +---------+------------------+-----+---------+-------+ DP       103               0.84 triphasic        +---------+------------------+-----+---------+-------+ Great Toe109               0.89 Normal            +---------+------------------+-----+---------+-------+ +-------+---------------+----------------+ ABI/TBIToday's ABI/TBIPrevious ABI/TBI +-------+---------------+----------------+ Right  0.97           0.97             +-------+---------------+----------------+ Left   1.01           0.89             +-------+---------------+----------------+  Right Doppler Findings: +--------+--------+-----+---------+--------+ Site    PressureIndexDoppler  Comments +--------+--------+-----+---------+--------+ [id number]  triphasic         +--------+--------+-----+---------+--------+ Radial               triphasic         +--------+--------+-----+---------+--------+ Ulnar                triphasic         +--------+--------+-----+---------+--------+  Left Doppler Findings: +--------+--------+-----+---------+--------+ Site    PressureIndexDoppler  Comments +--------+--------+-----+---------+--------+ Brachial             triphasicAVF      +--------+--------+-----+---------+--------+ Radial               triphasic         +--------+--------+-----+---------+--------+ Ulnar                triphasic         +--------+--------+-----+---------+--------+   Summary: Right Carotid: Velocities in the right ICA are consistent with a 1-39% stenosis. Left Carotid: Velocities in the left ICA are consistent with a 1-39% stenosis. Vertebrals: Bilateral vertebral arteries demonstrate antegrade flow. Right ABI: Resting right ankle-brachial index is within normal range. Left ABI: Resting left ankle-brachial index is within normal range. Right Upper Extremity: Doppler waveform obliterate with right radial compression. Doppler waveforms remain within normal limits with right ulnar compression. Left Upper Extremity: Doppler waveforms decrease 50% with left radial compression. Doppler waveforms remain within normal limits with left ulnar compression.  Electronically signed by Deitra Mayo MD on 06/02/2022 at 3:01:03 PM.    Final      Patient Profile     71 year old male with past medical history of diabetes mellitus, hypertension, hyperlipidemia, obstructive sleep apnea, end-stage renal disease on peritoneal dialysis, coronary artery disease with history of PCI of the right coronary artery for evaluation of unstable angina.  Cardiac catheterization revealed severe in-stent restenosis in the mid right coronary artery as well as the distal RCA lesion, 75% proximal LAD and 50% circumflex.  Echocardiogram shows normal LV function and mild aortic insufficiency.  Assessment & Plan    1 unstable angina/NSTEMI-cardiac catheterization revealed severe in-stent restenosis in the right coronary artery and also with proximal LAD disease.  Echocardiogram shows preserved LV function.  Plan is for coronary artery bypass and graft on Monday.  Continue present medications including aspirin, statin, heparin and nitroglycerin.  Patient is not on beta-blockade given baseline bradycardia.   2 Mobitz 1 second-degree AV block-presently with sinus bradycardia.  He has not had prolonged pauses this admission.  He did have complete heart block at night on previous monitor and saw Dr. Caryl Comes but patient apparently was not on CPAP and has been managed conservatively.  No indication for pacemaker at present but likely will require in the future.  Continue telemetry.  3 end-stage renal disease-peritoneal dialysis dependent. Nephrology following.  4 hyperlipidemia-continue statin.  5 obstructive sleep apnea-continue CPAP.  For questions or updates, please contact [place] Please consult [email or web address] for contact info under        Signed, Kirk Ruths, MD  06/03/2022, 6:06 AM

## 2022-06-04 DIAGNOSIS — I214 Non-ST elevation (NSTEMI) myocardial infarction: Secondary | ICD-10-CM | POA: Diagnosis not present

## 2022-06-04 LAB — CBC
HCT: 25.8 % — ABNORMAL LOW (ref 39.0–52.0)
Hemoglobin: 8.3 g/dL — ABNORMAL LOW (ref 13.0–17.0)
MCH: 30.6 pg (ref 26.0–34.0)
MCHC: 32.2 g/dL (ref 30.0–36.0)
MCV: 95.2 fL (ref 80.0–100.0)
Platelets: 92 10*3/uL — ABNORMAL LOW (ref 150–400)
RBC: 2.71 MIL/uL — ABNORMAL LOW (ref 4.22–5.81)
RDW: 14.9 % (ref 11.5–15.5)
WBC: 4.2 10*3/uL (ref 4.0–10.5)
nRBC: 0 % (ref 0.0–0.2)

## 2022-06-04 LAB — PROTIME-INR
INR: 1 (ref 0.8–1.2)
Prothrombin Time: 13.2 seconds (ref 11.4–15.2)

## 2022-06-04 LAB — PREPARE RBC (CROSSMATCH)

## 2022-06-04 LAB — GLUCOSE, CAPILLARY
Glucose-Capillary: 97 mg/dL (ref 70–99)
Glucose-Capillary: 130 mg/dL — ABNORMAL HIGH (ref 70–99)
Glucose-Capillary: 92 mg/dL (ref 70–99)

## 2022-06-04 LAB — APTT: aPTT: 88 seconds — ABNORMAL HIGH (ref 24–36)

## 2022-06-04 LAB — HEPARIN LEVEL (UNFRACTIONATED): Heparin Unfractionated: 0.47 IU/mL (ref 0.30–0.70)

## 2022-06-04 LAB — SURGICAL PCR SCREEN
MRSA, PCR: NEGATIVE
Staphylococcus aureus: NEGATIVE

## 2022-06-04 LAB — ABO/RH: ABO/RH(D): B POS

## 2022-06-04 MED ORDER — VANCOMYCIN HCL 1500 MG/300ML IV SOLN
1500.0000 mg | INTRAVENOUS | Status: AC
  Administered 2022-06-05: 1500 mg via INTRAVENOUS
  Filled 2022-06-04: qty 300

## 2022-06-04 MED ORDER — TRANEXAMIC ACID (OHS) PUMP PRIME SOLUTION
2.0000 mg/kg | INTRAVENOUS | Status: DC
  Filled 2022-06-04: qty 2.14

## 2022-06-04 MED ORDER — PLASMA-LYTE A IV SOLN
INTRAVENOUS | Status: DC
  Filled 2022-06-04: qty 2.5

## 2022-06-04 MED ORDER — TRANEXAMIC ACID 1000 MG/10ML IV SOLN
1.5000 mg/kg/h | INTRAVENOUS | Status: AC
  Administered 2022-06-05: 1.5 mg/kg/h via INTRAVENOUS
  Filled 2022-06-04 (×2): qty 25

## 2022-06-04 MED ORDER — MANNITOL 20 % IV SOLN
INTRAVENOUS | Status: DC
  Filled 2022-06-04: qty 13

## 2022-06-04 MED ORDER — TRANEXAMIC ACID (OHS) BOLUS VIA INFUSION
15.0000 mg/kg | INTRAVENOUS | Status: AC
  Administered 2022-06-05: 1602 mg via INTRAVENOUS
  Filled 2022-06-04: qty 1602

## 2022-06-04 MED ORDER — DEXMEDETOMIDINE HCL IN NACL 400 MCG/100ML IV SOLN
0.1000 ug/kg/h | INTRAVENOUS | Status: AC
  Administered 2022-06-05: .7 ug/kg/h via INTRAVENOUS
  Filled 2022-06-04: qty 100

## 2022-06-04 MED ORDER — METOPROLOL TARTRATE 12.5 MG HALF TABLET
12.5000 mg | ORAL_TABLET | Freq: Once | ORAL | Status: DC
  Filled 2022-06-04: qty 1

## 2022-06-04 MED ORDER — MILRINONE LACTATE IN DEXTROSE 20-5 MG/100ML-% IV SOLN
0.3000 ug/kg/min | INTRAVENOUS | Status: DC
  Filled 2022-06-04: qty 100

## 2022-06-04 MED ORDER — TEMAZEPAM 7.5 MG PO CAPS
15.0000 mg | ORAL_CAPSULE | Freq: Once | ORAL | Status: DC | PRN
  Filled 2022-06-04: qty 2

## 2022-06-04 MED ORDER — EPINEPHRINE HCL 5 MG/250ML IV SOLN IN NS
0.0000 ug/min | INTRAVENOUS | Status: DC
  Filled 2022-06-04: qty 250

## 2022-06-04 MED ORDER — INSULIN REGULAR(HUMAN) IN NACL 100-0.9 UT/100ML-% IV SOLN
INTRAVENOUS | Status: AC
  Administered 2022-06-05: 1.9 [IU]/h via INTRAVENOUS
  Filled 2022-06-04: qty 100

## 2022-06-04 MED ORDER — PHENYLEPHRINE HCL-NACL 20-0.9 MG/250ML-% IV SOLN
30.0000 ug/min | INTRAVENOUS | Status: AC
  Administered 2022-06-05: 25 ug/min via INTRAVENOUS
  Filled 2022-06-04: qty 250

## 2022-06-04 MED ORDER — BISACODYL 5 MG PO TBEC
5.0000 mg | DELAYED_RELEASE_TABLET | Freq: Once | ORAL | Status: AC
  Administered 2022-06-04: 5 mg via ORAL
  Filled 2022-06-04: qty 1

## 2022-06-04 MED ORDER — CHLORHEXIDINE GLUCONATE CLOTH 2 % EX PADS
6.0000 | MEDICATED_PAD | Freq: Once | CUTANEOUS | Status: AC
  Administered 2022-06-04: 6 via TOPICAL

## 2022-06-04 MED ORDER — CHLORHEXIDINE GLUCONATE 0.12 % MT SOLN
15.0000 mL | Freq: Once | OROMUCOSAL | Status: AC
  Administered 2022-06-05: 15 mL via OROMUCOSAL
  Filled 2022-06-04: qty 15

## 2022-06-04 MED ORDER — TRANEXAMIC ACID 1000 MG/10ML IV SOLN
1.5000 mg/kg/h | INTRAVENOUS | Status: DC
  Filled 2022-06-04: qty 25

## 2022-06-04 MED ORDER — GENTAMICIN SULFATE 0.1 % EX CREA
1.0000 | TOPICAL_CREAM | Freq: Every day | CUTANEOUS | Status: DC
  Administered 2022-06-04 – 2022-06-14 (×9): 1 via TOPICAL
  Filled 2022-06-04 (×2): qty 15

## 2022-06-04 MED ORDER — CEFAZOLIN SODIUM-DEXTROSE 2-4 GM/100ML-% IV SOLN
2.0000 g | INTRAVENOUS | Status: AC
  Administered 2022-06-05: 2 g via INTRAVENOUS
  Filled 2022-06-04: qty 100

## 2022-06-04 MED ORDER — POTASSIUM CHLORIDE 2 MEQ/ML IV SOLN
80.0000 meq | INTRAVENOUS | Status: DC
  Filled 2022-06-04: qty 40

## 2022-06-04 MED ORDER — CEFAZOLIN SODIUM-DEXTROSE 2-4 GM/100ML-% IV SOLN
2.0000 g | INTRAVENOUS | Status: DC
  Filled 2022-06-04: qty 100

## 2022-06-04 MED ORDER — NOREPINEPHRINE 4 MG/250ML-% IV SOLN
0.0000 ug/min | INTRAVENOUS | Status: AC
  Administered 2022-06-05: 3 ug/min via INTRAVENOUS
  Filled 2022-06-04: qty 250

## 2022-06-04 MED ORDER — HEPARIN 30,000 UNITS/1000 ML (OHS) CELLSAVER SOLUTION
Status: DC
  Filled 2022-06-04: qty 1000

## 2022-06-04 NOTE — Progress Notes (Signed)
[place] for heparin Indication: chest pain/ACS and post-cath with cardiac surgery consult  Allergies  Allergen Reactions   Opana [Oxymorphone Hcl] Itching   Tramadol Itching    Patient Measurements: Height: '5\' 4"'$  (162.6 cm) Weight: 106.8 kg (235 lb 7.2 oz) IBW/kg (Calculated) : 59.2 Heparin Dosing Weight: 81.3kg  Vital Signs: Temp: 98 F (36.7 C) (11/19 0519) Temp Source: Oral (11/19 0519) BP: 104/63 (11/19 0548) Pulse Rate: 45 (11/19 0519)  Labs: Recent Labs    06/02/22 0312 06/03/22 0648 06/04/22 0557  HGB 8.5* 8.3* 8.3*  HCT 27.2* 25.0* 25.8*  PLT 106* 90* 92*  HEPARINUNFRC 0.32 0.29* 0.47  CREATININE 19.06* 20.40*  --      Estimated Creatinine Clearance: 3.7 mL/min (A) (by C-G formula based on SCr of 20.4 mg/dL (H)).  Medications:  Infusions:   dialysis solution 1.5% low-MG/low-CA     dialysis solution 2.5% low-MG/low-CA     heparin 1,650 Units/hr (06/03/22 1900)    Assessment: 10 yom with significant cardiac history including multiple episodes of restenosis presented to the ED with CP and was initiated on IV heparin. Pt s/p LHC with mvCAD, now planning for CABG next week. Heparin resumed post/cath.  Heparin level therapeutic at 0.47 this morning. Hgb 8.3, platelets 92. No issues with infusion reported, no signs/symptoms of bleeding reported.   Numerous difficulties obtaining heparin level samples so limiting lab draws. L arm is restricted and heparin is infusing via R AC and paused prior to lab collection.   Goal of Therapy:  Heparin level 0.3-0.7 units/ml Monitor platelets by anticoagulation protocol: Yes   Plan:  Continue heparin infusion at 1650 units/hr  Monitor daily heparin levels, CBC, and signs/symptoms of bleeding  Louanne Belton, PharmD, [facility] PGY1 Pharmacy Resident 06/04/2022 7:55 AM

## 2022-06-04 NOTE — Progress Notes (Signed)
RT unable to obtain abg and pt. Refused to do it again.

## 2022-06-04 NOTE — Progress Notes (Signed)
Rounding Note    Patient Name: [name] [name] Date of Encounter: 06/04/2022  [place] Cardiologist: Pixie Casino, MD   Subjective   No dyspnea; some CP yesterday but none this AM  Inpatient Medications    Scheduled Meds:  aspirin EC  81 mg Oral Daily   calcitRIOL  0.25 mcg Oral Daily   gentamicin cream  1 Application Topical Daily   insulin aspart  0-6 Units Subcutaneous TID WC   linagliptin  5 mg Oral Daily   nitroGLYCERIN  1 inch Topical Q6H   oxymetazoline  1 spray Each Nare BID   pantoprazole  40 mg Oral BID AC   polyethylene glycol  17 g Oral BID   sevelamer carbonate  1,600 mg Oral TID WC   simvastatin  20 mg Oral q morning   sodium chloride flush  3 mL Intravenous Q12H   torsemide  100 mg Oral Daily   Continuous Infusions:  dialysis solution 1.5% low-MG/low-CA     dialysis solution 2.5% low-MG/low-CA     heparin 1,650 Units/hr (06/03/22 1900)   PRN Meds: acetaminophen, albuterol, morphine injection, nitroGLYCERIN, ondansetron (ZOFRAN) IV, sodium chloride flush   Vital Signs    Vitals:   06/04/22 0238 06/04/22 0519 06/04/22 0521 06/04/22 0548  BP:  (!) 87/46 104/63 104/63  Pulse:  (!) 45    Resp:  16    Temp:  98 F (36.7 C)    TempSrc:  Oral    SpO2:      Weight: 103.5 kg 106.8 kg    Height:        Intake/Output Summary (Last 24 hours) at 06/04/2022 0806 Last data filed at 06/03/2022 1900 Gross per 24 hour  Intake 776.71 ml  Output 1352 ml  Net -575.29 ml       06/04/2022    5:19 AM 06/04/2022    2:38 AM 06/04/2022    2:00 AM  Last 3 Weights  Weight (lbs) 235 lb 7.2 oz 228 lb 2.8 oz 228 lb 2.8 oz  Weight (kg) 106.8 kg 103.5 kg 103.5 kg      Telemetry    Sinus bradycardia- Personally Reviewed   Physical Exam   GEN: NAD Neck: supple, no JVD Cardiac: irregular and bradycardic, no rub Respiratory: CTA, no wheeze GI: Soft, NT, mildly distended MS: No edema Neuro:  No focal findings Psych: Normal affect   Labs     High Sensitivity Troponin:   Recent Labs  Lab 05/30/22 0341 05/30/22 0640 05/30/22 1148  TROPONINIHS 100* 142* 330*      Chemistry Recent Labs  Lab 06/01/22 0212 06/02/22 0312 06/03/22 0648  NA 136 134* 137  K 3.9 3.9 4.1  CL 96* 94* 98  CO2 '26 26 25  '$ GLUCOSE 121* 132* 127*  BUN 50* 47* 50*  CREATININE 19.46* 19.06* 20.40*  CALCIUM 9.3 9.0 9.2  MG 2.0 2.0 2.1  PROT 5.8* 5.9* 5.9*  ALBUMIN 2.6* 2.7* 2.7*  AST 32 24 20  ALT '13 14 14  '$ ALKPHOS 46 42 35*  BILITOT 0.4 0.4 0.3  GFRNONAA 2* 2* 2*  ANIONGAP '14 14 14      '$ Hematology Recent Labs  Lab 06/02/22 0312 06/03/22 0648 06/04/22 0557  WBC 4.9 5.1 4.2  RBC 2.85* 2.66* 2.71*  HGB 8.5* 8.3* 8.3*  HCT 27.2* 25.0* 25.8*  MCV 95.4 94.0 95.2  MCH 29.8 31.2 30.6  MCHC 31.3 33.2 32.2  RDW 14.9 14.9 14.9  PLT 106* 90* 92*  Thyroid  Recent Labs  Lab 05/30/22 1148  TSH 2.033       Patient Profile     71 year old male with past medical history of diabetes mellitus, hypertension, hyperlipidemia, obstructive sleep apnea, end-stage renal disease on peritoneal dialysis, coronary artery disease with history of PCI of the right coronary artery for evaluation of unstable angina.  Cardiac catheterization revealed severe in-stent restenosis in the mid right coronary artery as well as the distal RCA lesion, 75% proximal LAD and 50% circumflex.  Echocardiogram shows normal LV function and mild aortic insufficiency.  Assessment & Plan    1 unstable angina/NSTEMI-cardiac catheterization revealed severe in-stent restenosis in the right coronary artery and also with proximal LAD disease.  Echocardiogram shows preserved LV function.  Plan is for coronary artery bypass and graft tomorrow.  Continue present medications including aspirin, statin, heparin and nitroglycerin.  Patient is not on beta-blockade given baseline bradycardia.   2 Mobitz 1 second-degree AV block-presently with sinus bradycardia.  He has not had  prolonged pauses this admission.  He did have complete heart block at night on previous monitor and saw Dr. Caryl Comes but patient apparently was not on CPAP and has been managed conservatively.  No indication for pacemaker at present but likely will require in the future.  Continue telemetry.  3 end-stage renal disease-peritoneal dialysis dependent. Nephrology following.  4 hyperlipidemia-continue statin.  5 obstructive sleep apnea-continue CPAP.  For questions or updates, please contact [place] Please consult [email or web address] for contact info under        Signed, Kirk Ruths, MD  06/04/2022, 8:06 AM

## 2022-06-04 NOTE — Progress Notes (Signed)
[facility] NEPHROLOGY PROGRESS NOTE  Assessment/ Plan: Pt is a 71 y.o. yo male   with past medical history significant for OSA, COPD, type II DM, CAD, ESRD on PD, anemia who was presented with chest pain, and NSTEMI, seen as a consultation for the management of ESRD.   Dialysis Orders from previous admission in 01/2022.: [facility] therapies.  CCPD, 5 exchanges 3 liter fill volumes, 10 min fill time, 1 hour 20 minutes dwell, 20 minute drain.  Last fill Icodextran.  EDW 103 kg. Uses combination of 1.5% and 2.5% depending upon BP and weight    #NSTEMI/unstable angina: Cardiac cath on 11/14 with severe in in-stent restenosis in the right coronary artery and proximal LAD disease.  Currently on heparin and cardiac medication.  Plan for CABG on Monday.     # ESRD on PD: Continue CCPD at night, 5 exchanges, 2-2.5 L fill volume, mix of 1.5 and 2.5% solution.  He does have left upper extremity AV fistula.  We will try to obtain outpatient record from outpatient unit.  Patient was complaining of abdominal distention and difficulty with 2.5 L of fill volume on 11/17.  We lowered fill volume to 2 L and he tolerated well.  Continue PD.  Patient understands that he may need HD after CABG.    # Hypertension: Blood pressure acceptable.  No edema.  Continue current cardiac medication.   # Anemia of ESRD: Hemoglobin around 8-9.  Iron saturation 27 with high ferritin.  Received Aranesp on 11/17.Marland Kitchen  #CKD-MBD: Calcium and phosphorus level acceptable.  Currently on sevelamer and calcitriol.  Subjective: Seen and examined.  Tolerated PD well overnight.  Denies nausea, vomiting, chest pain, shortness of breath.  No issue with BM. Objective Vital signs in last 24 hours: Vitals:   06/04/22 0238 06/04/22 0519 06/04/22 0521 06/04/22 0548  BP:  (!) 87/46 104/63 104/63  Pulse:  (!) 45    Resp:  16    Temp:  98 F (36.7 C)    TempSrc:  Oral    SpO2:      Weight: 103.5 kg 106.8 kg     Height:       Weight change:   Intake/Output Summary (Last 24 hours) at 06/04/2022 1045 Last data filed at 06/03/2022 1900 Gross per 24 hour  Intake 536.71 ml  Output --  Net 536.71 ml        Labs: RENAL PANEL Recent Labs    07/24/21 0254 07/25/21 0201 07/29/21 1410 08/14/21 2152 12/14/21 0001 12/22/21 1017 12/27/21 0215 01/18/22 1149 01/18/22 1436 01/20/22 0210 01/21/22 0229 05/30/22 0341 05/30/22 1148 05/30/22 1507 05/30/22 1513 05/30/22 1517 05/31/22 0252 06/01/22 0212 06/02/22 0312 06/03/22 0648  NA 130*   < > 134* 130*   < > 136 133* 135  --  133* 134* 136  --  135 135 135 136 136 134* 137  K 3.3*   < > 4.0 3.4*   < > 4.6 3.6 3.9  --  3.5 3.7 3.6  --  3.5 3.5 3.5 4.0 3.9 3.9 4.1  CL 89*   < > 90* 91*   < > 92* 95* 94*  --  95* 95* 96*  --   --   --   --  98 96* 94* 98  CO2 26   < > 27 23   < > '28 29 29  '$ --  '24 27 26  '$ --   --   --   --  27  $'26 26 25  'a$ GLUCOSE 97   < > 124* 104*   < > 117* 134* 116*  --  130* 144* 128*  --   --   --   --  101* 121* 132* 127*  BUN 73*   < > 67* 50*   < > 58* 52* 40*  --  51* 47* 44*  --   --   --   --  48* 50* 47* 50*  CREATININE 20.21*   < > 20.22* 18.51*   < > 16.87* 17.44* 17.26* 17.08* 19.15* 18.48* 17.29*  --   --   --   --  19.27* 19.46* 19.06* 20.40*  CALCIUM 9.0   < > 9.4 9.5   < > 10.5* 9.1 9.8  --  9.5 9.3 9.9  --   --   --   --  9.4 9.3 9.0 9.2  MG  --   --  2.4  --   --   --   --   --   --   --   --   --  2.1  --   --   --   --  2.0 2.0 2.1  PHOS 8.8*  --   --   --   --   --   --   --   --   --   --   --   --   --   --   --   --  3.3 3.3 4.0  ALBUMIN 3.0*  2.8*  --   --  2.7*  --   --   --  3.2*  --   --   --   --  3.0*  --   --   --   --  2.6* 2.7* 2.7*   < > = values in this interval not displayed.      Liver Function Tests: Recent Labs  Lab 06/01/22 0212 06/02/22 0312 06/03/22 0648  AST 32 24 20  ALT '13 14 14  '$ ALKPHOS 46 42 35*  BILITOT 0.4 0.4 0.3  PROT 5.8* 5.9* 5.9*  ALBUMIN 2.6* 2.7* 2.7*     No results for input(s): "LIPASE", "AMYLASE" in the last 168 hours. No results for input(s): "AMMONIA" in the last 168 hours. CBC: Recent Labs    05/31/22 0252 06/01/22 0212 06/02/22 0312 06/03/22 0648 06/04/22 0557  HGB 9.5* 9.0* 8.5* 8.3* 8.3*  MCV 92.9 93.3 95.4 94.0 95.2  FERRITIN  --   --  1,105*  --   --   TIBC  --   --  293  --   --   IRON  --   --  79  --   --      Cardiac Enzymes: No results for input(s): "CKTOTAL", "CKMB", "CKMBINDEX", "TROPONINI" in the last 168 hours. CBG: Recent Labs  Lab 06/03/22 0741 06/03/22 1123 06/03/22 1618 06/03/22 2112 06/04/22 0831  GLUCAP 113* 128* 119* 163* 97     Iron Studies:  Recent Labs    06/02/22 0312  IRON 79  TIBC 293  FERRITIN 1,105*    Studies/Results: No results found.  Medications: Infusions:  [START ON 06/05/2022]  ceFAZolin (ANCEF) IV     [START ON 06/05/2022]  ceFAZolin (ANCEF) IV     [START ON 06/05/2022] dexmedetomidine     dialysis solution 1.5% low-MG/low-CA     dialysis solution 2.5% low-MG/low-CA     [START ON 06/05/2022] heparin 30,000 units/NS 1000 mL solution for CELLSAVER  heparin 1,650 Units/hr (06/03/22 1900)   [START ON 06/05/2022] milrinone     [START ON 06/05/2022] norepinephrine     [START ON 06/05/2022] tranexamic acid (CYKLOKAPRON) 2,500 mg in sodium chloride 0.9 % 250 mL (10 mg/mL) infusion     [START ON 06/05/2022] tranexamic acid (CYKLOKAPRON) 2,500 mg in sodium chloride 0.9 % 250 mL (10 mg/mL) infusion     [START ON 06/05/2022] vancomycin      Scheduled Medications:  aspirin EC  81 mg Oral Daily   calcitRIOL  0.25 mcg Oral Daily   [START ON 06/05/2022] epinephrine  0-10 mcg/min Intravenous To OR   gentamicin cream  1 Application Topical Daily   [START ON 06/05/2022] heparin sodium (porcine) 2,500 Units, papaverine 30 mg in electrolyte-A (PLASMALYTE-A PH 7.4) 500 mL irrigation   Irrigation To OR   insulin aspart  0-6 Units Subcutaneous TID WC   [START ON 06/05/2022]  insulin   Intravenous To OR   [START ON 06/05/2022] [facility] Blood Cardioplegia vial (lidocaine/magnesium/mannitol 0.26g-4g-6.4g)   Intracoronary To OR   linagliptin  5 mg Oral Daily   nitroGLYCERIN  1 inch Topical Q6H   oxymetazoline  1 spray Each Nare BID   pantoprazole  40 mg Oral BID AC   [START ON 06/05/2022] phenylephrine  30-200 mcg/min Intravenous To OR   polyethylene glycol  17 g Oral BID   [START ON 06/05/2022] potassium chloride  80 mEq Other To OR   sevelamer carbonate  1,600 mg Oral TID WC   simvastatin  20 mg Oral q morning   sodium chloride flush  3 mL Intravenous Q12H   torsemide  100 mg Oral Daily   [START ON 06/05/2022] tranexamic acid  15 mg/kg Intravenous To OR   [START ON 06/05/2022] tranexamic acid  2 mg/kg Intracatheter To OR    have reviewed scheduled and prn medications.  Physical Exam: General:NAD, comfortable Heart:RRR, s1s2 nl Lungs: Clear b/l, no crackle Abdomen:soft, Non-tender, non-distended Extremities: No leg edema Dialysis Access: PD catheter site clean, left upper extremity AV fistula has good thrill and bruit.  Dionisio Aragones Prasad Devika Dragovich 06/04/2022,10:45 AM  LOS: 5 days

## 2022-06-04 NOTE — Progress Notes (Signed)
[facility] PROGRESS NOTE  HARLAN ERVINE (DOB: 03/02/1951) ASN:[id number] PCP: Carrolyn Meiers, MD  Brief Narrative: ALCIDE MEMOLI is a 71 y.o. male with a history of CAD w/chronic RCA ISR, ESRD on PD, AOCKD, COPD who presented to the ED on 05/30/2022 with chest pain, found to have obstructive CAD for which CABG is planned 11/20.  Subjective: No new complaints currently. Has had waxing/waning chest discomfort still over the past 24 hours. Reports leg swelling any time his legs are dependent for years noticed first as a truck driver. They are unchanged currently.  Objective: BP (!) 144/69   Pulse (!) 45   Temp 98 F (36.7 C) (Oral)   Resp 16   Ht '5\' 4"'$  (1.626 m)   Wt 106.8 kg   SpO2 96%   BMI 40.42 kg/m   Gen: 71 y.o. male in no distress Pulm: Nonlabored breathing room air. Clear. CV: Regular rate and rhythm. No murmur, rub, or gallop. No JVD, L > R LE dependent edema. Nontender, negative Homan's.  GI: Abdomen soft, non-tender, non-distended, with normoactive bowel sounds. PD cath c/d/i Ext: Warm, no deformities Skin: No rashes, lesions or ulcers on visualized skin. Neuro: Alert and oriented. No focal neurological deficits. Psych: Judgement and insight appear fair. Mood euthymic & affect congruent. Behavior is appropriate.    Assessment & Plan: NSTEMI, CAD with multivessel obstructive disease, HLD: LHC with severe in stent restenosis in mid RCA, de novo distal RCA lesion.  Proximal LAD lesion worsened.  - Consulted CT surgery, planning CABG 11/20. Preoperative work up per them.  - Continue heparin gtt, ASA '81mg'$ , statin,  - Bradycardia limits BB - Continue prn nitropaste  Dependent edema consistent with venous insufficiency: Based on chronicity and his therapeutic anticoagulation throughout his stay here, there is very low suspicion for DVT at this time.  Left middle ear effusion: No evidence of infection.  - Afrin IN - Consider ENT referral if symptoms  persistent.   ESRD:  - Continue PD per nephrology. Appreciate their assistance.  - Calcitriol, sevelamer  IDT2DM: HbA1c 5.9% indicating perhaps too tight of control - Continue SSI - Continue home linagliptin - Holding basal insulin currently. Based on low A1c, and controlled CBGs while inpatient, would consider lowering/holding glargine 22u at discharge.  HTN: BPs are soft.  - Hold imdur - Continue UF with PD, torsemide, nitropaste q6h.   2nd degree AVB, Mobitz 1: Currently sinus bradycardia without pauses, has hx nocturnal CHB managed with Dr. Caryl Comes with reinitiation of CPAP.  - Cardiology suspected the patient will eventually require pacemaker, but no present indication.   COPD: Quiescent. Wears O2 as needed per PCCM note. Hx tobacco use, occupational WTC dust - prn BDs.  - Note PFTs 12/2019 ratio 74 FEV1 of 3 6 L (57% predicted), normal (pseudonormal) lung volumes, normal diffusion capacity.   OSA:  - Continue CPAP qHS  GERD:  - PPI  Obesity: Body mass index is 40.42 kg/m.   [name], MD [facility] [email or web address] 06/04/2022, 2:03 PM

## 2022-06-04 NOTE — Progress Notes (Signed)
     [address]       [name] [address]             [phone]       OR tomorrow for CABG  [name]  

## 2022-06-05 ENCOUNTER — Inpatient Hospital Stay (HOSPITAL_COMMUNITY): Payer: Medicare Other | Admitting: Registered Nurse

## 2022-06-05 ENCOUNTER — Inpatient Hospital Stay (HOSPITAL_COMMUNITY): Payer: Medicare Other

## 2022-06-05 ENCOUNTER — Other Ambulatory Visit: Payer: Self-pay

## 2022-06-05 ENCOUNTER — Inpatient Hospital Stay (HOSPITAL_COMMUNITY)
Admission: EM | Disposition: A | Discharge status: Home Health | Payer: Self-pay | Source: Home / Self Care | Attending: Thoracic Surgery (Cardiothoracic Vascular Surgery)

## 2022-06-05 ENCOUNTER — Encounter (HOSPITAL_COMMUNITY): Payer: Self-pay | Admitting: Internal Medicine

## 2022-06-05 DIAGNOSIS — N186 End stage renal disease: Secondary | ICD-10-CM

## 2022-06-05 DIAGNOSIS — I2510 Atherosclerotic heart disease of native coronary artery without angina pectoris: Secondary | ICD-10-CM

## 2022-06-05 DIAGNOSIS — J449 Chronic obstructive pulmonary disease, unspecified: Secondary | ICD-10-CM

## 2022-06-05 DIAGNOSIS — I120 Hypertensive chronic kidney disease with stage 5 chronic kidney disease or end stage renal disease: Secondary | ICD-10-CM

## 2022-06-05 DIAGNOSIS — E1122 Type 2 diabetes mellitus with diabetic chronic kidney disease: Secondary | ICD-10-CM

## 2022-06-05 DIAGNOSIS — Z87891 Personal history of nicotine dependence: Secondary | ICD-10-CM

## 2022-06-05 DIAGNOSIS — Z951 Presence of aortocoronary bypass graft: Secondary | ICD-10-CM | POA: Diagnosis not present

## 2022-06-05 DIAGNOSIS — Z992 Dependence on renal dialysis: Secondary | ICD-10-CM

## 2022-06-05 DIAGNOSIS — I214 Non-ST elevation (NSTEMI) myocardial infarction: Secondary | ICD-10-CM

## 2022-06-05 HISTORY — PX: TEE WITHOUT CARDIOVERSION: SHX5443

## 2022-06-05 HISTORY — PX: CORONARY ARTERY BYPASS GRAFT: SHX141

## 2022-06-05 LAB — CBC
HCT: 26.8 % — ABNORMAL LOW (ref 39.0–52.0)
HCT: 25.4 % — ABNORMAL LOW (ref 39.0–52.0)
HCT: 24.5 % — ABNORMAL LOW (ref 39.0–52.0)
Hemoglobin: 8.3 g/dL — ABNORMAL LOW (ref 13.0–17.0)
Hemoglobin: 8 g/dL — ABNORMAL LOW (ref 13.0–17.0)
Hemoglobin: 8 g/dL — ABNORMAL LOW (ref 13.0–17.0)
MCH: 30.1 pg (ref 26.0–34.0)
MCH: 30 pg (ref 26.0–34.0)
MCH: 30.7 pg (ref 26.0–34.0)
MCHC: 31 g/dL (ref 30.0–36.0)
MCHC: 31.5 g/dL (ref 30.0–36.0)
MCHC: 32.7 g/dL (ref 30.0–36.0)
MCV: 97.1 fL (ref 80.0–100.0)
MCV: 95.1 fL (ref 80.0–100.0)
MCV: 93.9 fL (ref 80.0–100.0)
Platelets: 123 10*3/uL — ABNORMAL LOW (ref 150–400)
Platelets: 105 10*3/uL — ABNORMAL LOW (ref 150–400)
Platelets: 98 10*3/uL — ABNORMAL LOW (ref 150–400)
RBC: 2.76 MIL/uL — ABNORMAL LOW (ref 4.22–5.81)
RBC: 2.67 MIL/uL — ABNORMAL LOW (ref 4.22–5.81)
RBC: 2.61 MIL/uL — ABNORMAL LOW (ref 4.22–5.81)
RDW: 15.2 % (ref 11.5–15.5)
RDW: 16.2 % — ABNORMAL HIGH (ref 11.5–15.5)
RDW: 16.4 % — ABNORMAL HIGH (ref 11.5–15.5)
WBC: 4.6 10*3/uL (ref 4.0–10.5)
WBC: 7.5 10*3/uL (ref 4.0–10.5)
WBC: 7.1 10*3/uL (ref 4.0–10.5)
nRBC: 0.4 % — ABNORMAL HIGH (ref 0.0–0.2)
nRBC: 0.4 % — ABNORMAL HIGH (ref 0.0–0.2)
nRBC: 0 % (ref 0.0–0.2)

## 2022-06-05 LAB — POCT I-STAT 7, (LYTES, BLD GAS, ICA,H+H)
Acid-Base Excess: 1 mmol/L (ref 0.0–2.0)
Acid-Base Excess: 0 mmol/L (ref 0.0–2.0)
Bicarbonate: 26.3 mmol/L (ref 20.0–28.0)
Bicarbonate: 24.7 mmol/L (ref 20.0–28.0)
Calcium, Ion: 1.2 mmol/L (ref 1.15–1.40)
Calcium, Ion: 1.16 mmol/L (ref 1.15–1.40)
HCT: 23 % — ABNORMAL LOW (ref 39.0–52.0)
HCT: 25 % — ABNORMAL LOW (ref 39.0–52.0)
Hemoglobin: 7.8 g/dL — ABNORMAL LOW (ref 13.0–17.0)
Hemoglobin: 8.5 g/dL — ABNORMAL LOW (ref 13.0–17.0)
O2 Saturation: 100 %
O2 Saturation: 99 %
Potassium: 4.8 mmol/L (ref 3.5–5.1)
Potassium: 4.1 mmol/L (ref 3.5–5.1)
Sodium: 137 mmol/L (ref 135–145)
Sodium: 137 mmol/L (ref 135–145)
TCO2: 28 mmol/L (ref 22–32)
TCO2: 26 mmol/L (ref 22–32)
pCO2 arterial: 45.2 mmHg (ref 32–48)
pCO2 arterial: 41 mmHg (ref 32–48)
pH, Arterial: 7.374 (ref 7.35–7.45)
pH, Arterial: 7.387 (ref 7.35–7.45)
pO2, Arterial: 231 mmHg — ABNORMAL HIGH (ref 83–108)
pO2, Arterial: 132 mmHg — ABNORMAL HIGH (ref 83–108)

## 2022-06-05 LAB — BASIC METABOLIC PANEL
Anion gap: 15 (ref 5–15)
BUN: 48 mg/dL — ABNORMAL HIGH (ref 8–23)
CO2: 22 mmol/L (ref 22–32)
Calcium: 7.9 mg/dL — ABNORMAL LOW (ref 8.9–10.3)
Chloride: 100 mmol/L (ref 98–111)
Creatinine, Ser: 19.52 mg/dL — ABNORMAL HIGH (ref 0.61–1.24)
GFR, Estimated: 2 mL/min — ABNORMAL LOW (ref >60)
Glucose, Bld: 148 mg/dL — ABNORMAL HIGH (ref 70–99)
Potassium: 5.3 mmol/L — ABNORMAL HIGH (ref 3.5–5.1)
Sodium: 137 mmol/L (ref 135–145)

## 2022-06-05 LAB — PROTIME-INR
INR: 1.2 (ref 0.8–1.2)
Prothrombin Time: 15.2 seconds (ref 11.4–15.2)

## 2022-06-05 LAB — RENAL FUNCTION PANEL
Albumin: 2.7 g/dL — ABNORMAL LOW (ref 3.5–5.0)
Anion gap: 13 (ref 5–15)
BUN: 52 mg/dL — ABNORMAL HIGH (ref 8–23)
CO2: 27 mmol/L (ref 22–32)
Calcium: 9.1 mg/dL (ref 8.9–10.3)
Chloride: 98 mmol/L (ref 98–111)
Creatinine, Ser: 20.83 mg/dL — ABNORMAL HIGH (ref 0.61–1.24)
GFR, Estimated: 2 mL/min — ABNORMAL LOW (ref >60)
Glucose, Bld: 143 mg/dL — ABNORMAL HIGH (ref 70–99)
Phosphorus: 3.6 mg/dL (ref 2.5–4.6)
Potassium: 4.3 mmol/L (ref 3.5–5.1)
Sodium: 138 mmol/L (ref 135–145)

## 2022-06-05 LAB — ECHO INTRAOPERATIVE TEE
AV Mean grad: 5 mmHg
AV Peak grad: 10.2 mmHg
Ao pk vel: 1.6 m/s
Area-P 1/2: 0.98 cm2
Height: 64 in
S' Lateral: 3.9 cm
Weight: 3767.22 oz

## 2022-06-05 LAB — POCT I-STAT, CHEM 8
BUN: 49 mg/dL — ABNORMAL HIGH (ref 8–23)
Calcium, Ion: 1.21 mmol/L (ref 1.15–1.40)
Chloride: 98 mmol/L (ref 98–111)
Creatinine, Ser: >18 mg/dL — ABNORMAL HIGH (ref 0.61–1.24)
Glucose, Bld: 128 mg/dL — ABNORMAL HIGH (ref 70–99)
HCT: 20 % — ABNORMAL LOW (ref 39.0–52.0)
Hemoglobin: 6.8 g/dL — CL (ref 13.0–17.0)
Potassium: 4.8 mmol/L (ref 3.5–5.1)
Sodium: 138 mmol/L (ref 135–145)
TCO2: 28 mmol/L (ref 22–32)

## 2022-06-05 LAB — GLUCOSE, CAPILLARY
Glucose-Capillary: 140 mg/dL — ABNORMAL HIGH (ref 70–99)
Glucose-Capillary: 150 mg/dL — ABNORMAL HIGH (ref 70–99)
Glucose-Capillary: 118 mg/dL — ABNORMAL HIGH (ref 70–99)
Glucose-Capillary: 117 mg/dL — ABNORMAL HIGH (ref 70–99)
Glucose-Capillary: 127 mg/dL — ABNORMAL HIGH (ref 70–99)
Glucose-Capillary: 142 mg/dL — ABNORMAL HIGH (ref 70–99)
Glucose-Capillary: 124 mg/dL — ABNORMAL HIGH (ref 70–99)
Glucose-Capillary: 147 mg/dL — ABNORMAL HIGH (ref 70–99)
Glucose-Capillary: 130 mg/dL — ABNORMAL HIGH (ref 70–99)
Glucose-Capillary: 165 mg/dL — ABNORMAL HIGH (ref 70–99)
Glucose-Capillary: 150 mg/dL — ABNORMAL HIGH (ref 70–99)
Glucose-Capillary: 131 mg/dL — ABNORMAL HIGH (ref 70–99)

## 2022-06-05 LAB — MAGNESIUM: Magnesium: 1.9 mg/dL (ref 1.7–2.4)

## 2022-06-05 LAB — PREPARE RBC (CROSSMATCH)

## 2022-06-05 LAB — SARS CORONAVIRUS 2 BY RT PCR: SARS Coronavirus 2 by RT PCR: NEGATIVE

## 2022-06-05 LAB — HEPARIN LEVEL (UNFRACTIONATED): Heparin Unfractionated: 0.5 IU/mL (ref 0.30–0.70)

## 2022-06-05 LAB — APTT: aPTT: 35 seconds (ref 24–36)

## 2022-06-05 SURGERY — OFF PUMP CORONARY ARTERY BYPASS GRAFTING (CABG)
Anesthesia: General | Site: Chest

## 2022-06-05 MED ORDER — EPHEDRINE SULFATE-NACL 50-0.9 MG/10ML-% IV SOSY
PREFILLED_SYRINGE | INTRAVENOUS | Status: DC | PRN
  Administered 2022-06-05: 7.5 mg via INTRAVENOUS

## 2022-06-05 MED ORDER — LACTATED RINGERS IV SOLN: INTRAVENOUS | Status: DC | PRN

## 2022-06-05 MED ORDER — FENTANYL CITRATE (PF) 250 MCG/5ML IJ SOLN
INTRAMUSCULAR | Status: AC
  Filled 2022-06-05: qty 5

## 2022-06-05 MED ORDER — ACETAMINOPHEN 160 MG/5ML PO SOLN
1000.0000 mg | Freq: Four times a day (QID) | ORAL | Status: AC
  Administered 2022-06-06 (×2): 1000 mg
  Filled 2022-06-05 (×2): qty 40.6

## 2022-06-05 MED ORDER — POTASSIUM CHLORIDE 10 MEQ/50ML IV SOLN: 10.0000 meq | INTRAVENOUS | Status: AC

## 2022-06-05 MED ORDER — CHLORHEXIDINE GLUCONATE CLOTH 2 % EX PADS
6.0000 | MEDICATED_PAD | Freq: Every day | CUTANEOUS | Status: DC
  Administered 2022-06-05 – 2022-06-19 (×15): 6 via TOPICAL

## 2022-06-05 MED ORDER — METOCLOPRAMIDE HCL 5 MG/ML IJ SOLN
10.0000 mg | Freq: Four times a day (QID) | INTRAMUSCULAR | Status: AC
  Administered 2022-06-05 – 2022-06-06 (×4): 10 mg via INTRAVENOUS
  Filled 2022-06-05 (×4): qty 2

## 2022-06-05 MED ORDER — LACTATED RINGERS IV SOLN: INTRAVENOUS | Status: DC

## 2022-06-05 MED ORDER — SUCCINYLCHOLINE CHLORIDE 200 MG/10ML IV SOSY
PREFILLED_SYRINGE | INTRAVENOUS | Status: AC
  Filled 2022-06-05: qty 10

## 2022-06-05 MED ORDER — FENTANYL CITRATE (PF) 250 MCG/5ML IJ SOLN
INTRAMUSCULAR | Status: DC | PRN
  Administered 2022-06-05: 100 ug via INTRAVENOUS
  Administered 2022-06-05 (×2): 150 ug via INTRAVENOUS
  Administered 2022-06-05: 100 ug via INTRAVENOUS
  Administered 2022-06-05: 50 ug via INTRAVENOUS
  Administered 2022-06-05: 150 ug via INTRAVENOUS
  Administered 2022-06-05 (×3): 100 ug via INTRAVENOUS

## 2022-06-05 MED ORDER — PANTOPRAZOLE SODIUM 40 MG PO TBEC
40.0000 mg | DELAYED_RELEASE_TABLET | Freq: Every day | ORAL | Status: DC
  Administered 2022-06-07 – 2022-06-19 (×13): 40 mg via ORAL
  Filled 2022-06-05 (×13): qty 1

## 2022-06-05 MED ORDER — HEPARIN SODIUM (PORCINE) 1000 UNIT/ML IJ SOLN
INTRAMUSCULAR | Status: AC
  Filled 2022-06-05: qty 1

## 2022-06-05 MED ORDER — SUCCINYLCHOLINE CHLORIDE 200 MG/10ML IV SOSY
PREFILLED_SYRINGE | INTRAVENOUS | Status: DC | PRN
  Administered 2022-06-05: 140 mg via INTRAVENOUS

## 2022-06-05 MED ORDER — SODIUM CHLORIDE 0.9 % IV SOLN: INTRAVENOUS | Status: DC | PRN

## 2022-06-05 MED ORDER — LACTATED RINGERS IV SOLN: 500.0000 mL | Freq: Once | INTRAVENOUS | Status: DC | PRN

## 2022-06-05 MED ORDER — METOPROLOL TARTRATE 5 MG/5ML IV SOLN
2.5000 mg | INTRAVENOUS | Status: DC | PRN
  Administered 2022-06-05: 5 mg via INTRAVENOUS
  Filled 2022-06-05: qty 5

## 2022-06-05 MED ORDER — ROCURONIUM BROMIDE 10 MG/ML (PF) SYRINGE
PREFILLED_SYRINGE | INTRAVENOUS | Status: AC
  Filled 2022-06-05: qty 10

## 2022-06-05 MED ORDER — SODIUM CHLORIDE 0.9 % IV SOLN: INTRAVENOUS | Status: DC

## 2022-06-05 MED ORDER — PLASMA-LYTE A IV SOLN: INTRAVENOUS | Status: DC | PRN

## 2022-06-05 MED ORDER — SODIUM CHLORIDE 0.45 % IV SOLN: INTRAVENOUS | Status: DC | PRN

## 2022-06-05 MED ORDER — AMIODARONE HCL IN DEXTROSE 360-4.14 MG/200ML-% IV SOLN
60.0000 mg/h | INTRAVENOUS | Status: AC
  Administered 2022-06-05 (×2): 60 mg/h via INTRAVENOUS
  Filled 2022-06-05 (×2): qty 200

## 2022-06-05 MED ORDER — PHENYLEPHRINE HCL-NACL 20-0.9 MG/250ML-% IV SOLN
0.0000 ug/min | INTRAVENOUS | Status: DC
  Administered 2022-06-06: 5 ug/min via INTRAVENOUS
  Filled 2022-06-05: qty 250

## 2022-06-05 MED ORDER — ALBUMIN HUMAN 5 % IV SOLN: INTRAVENOUS | Status: DC | PRN

## 2022-06-05 MED ORDER — SODIUM CHLORIDE 0.9 % IV SOLN
20.0000 ug | Freq: Once | INTRAVENOUS | Status: AC
  Administered 2022-06-05: 20 ug via INTRAVENOUS
  Filled 2022-06-05 (×2): qty 5

## 2022-06-05 MED ORDER — CEFAZOLIN SODIUM-DEXTROSE 2-4 GM/100ML-% IV SOLN
2.0000 g | Freq: Three times a day (TID) | INTRAVENOUS | Status: DC
  Administered 2022-06-05: 2 g via INTRAVENOUS
  Filled 2022-06-05: qty 100

## 2022-06-05 MED ORDER — HEPARIN SODIUM (PORCINE) 1000 UNIT/ML IJ SOLN
INTRAMUSCULAR | Status: DC | PRN
  Administered 2022-06-05: 5000 [IU] via INTRAVENOUS

## 2022-06-05 MED ORDER — ACETAMINOPHEN 160 MG/5ML PO SOLN: 650.0000 mg | Freq: Once | ORAL | Status: AC

## 2022-06-05 MED ORDER — MAGNESIUM SULFATE 4 GM/100ML IV SOLN: 4.0000 g | Freq: Once | INTRAVENOUS | Status: DC

## 2022-06-05 MED ORDER — TRAMADOL HCL 50 MG PO TABS: 50.0000 mg | ORAL_TABLET | ORAL | Status: DC | PRN

## 2022-06-05 MED ORDER — MIDAZOLAM HCL (PF) 5 MG/ML IJ SOLN
INTRAMUSCULAR | Status: DC | PRN
  Administered 2022-06-05: 1 mg via INTRAVENOUS
  Administered 2022-06-05 (×2): 2 mg via INTRAVENOUS
  Administered 2022-06-05: 1 mg via INTRAVENOUS

## 2022-06-05 MED ORDER — FAMOTIDINE IN NACL 20-0.9 MG/50ML-% IV SOLN
20.0000 mg | Freq: Two times a day (BID) | INTRAVENOUS | Status: AC
  Administered 2022-06-05 (×2): 20 mg via INTRAVENOUS
  Filled 2022-06-05: qty 50

## 2022-06-05 MED ORDER — METOPROLOL TARTRATE 12.5 MG HALF TABLET: 12.5000 mg | ORAL_TABLET | Freq: Two times a day (BID) | ORAL | Status: DC

## 2022-06-05 MED ORDER — DEXMEDETOMIDINE HCL IN NACL 400 MCG/100ML IV SOLN
0.0000 ug/kg/h | INTRAVENOUS | Status: DC
  Administered 2022-06-06 (×2): 0.2 ug/kg/h via INTRAVENOUS
  Filled 2022-06-05 (×3): qty 100

## 2022-06-05 MED ORDER — OXYCODONE HCL 5 MG PO TABS
5.0000 mg | ORAL_TABLET | ORAL | Status: DC | PRN
  Administered 2022-06-07 (×4): 10 mg via ORAL
  Administered 2022-06-08 – 2022-06-09 (×4): 5 mg via ORAL
  Administered 2022-06-10: 10 mg via ORAL
  Administered 2022-06-10: 5 mg via ORAL
  Administered 2022-06-12: 10 mg via ORAL
  Administered 2022-06-12: 5 mg via ORAL
  Administered 2022-06-12 – 2022-06-14 (×4): 10 mg via ORAL
  Administered 2022-06-14: 5 mg via ORAL
  Administered 2022-06-14 – 2022-06-18 (×8): 10 mg via ORAL
  Filled 2022-06-05: qty 2
  Filled 2022-06-05: qty 1
  Filled 2022-06-05 (×2): qty 2
  Filled 2022-06-05: qty 1
  Filled 2022-06-05 (×3): qty 2
  Filled 2022-06-05: qty 1
  Filled 2022-06-05 (×5): qty 2
  Filled 2022-06-05: qty 1
  Filled 2022-06-05 (×3): qty 2
  Filled 2022-06-05: qty 1
  Filled 2022-06-05: qty 2
  Filled 2022-06-05 (×3): qty 1
  Filled 2022-06-05 (×3): qty 2

## 2022-06-05 MED ORDER — ALBUMIN HUMAN 5 % IV SOLN
250.0000 mL | INTRAVENOUS | Status: AC | PRN
  Administered 2022-06-05 – 2022-06-06 (×4): 12.5 g via INTRAVENOUS
  Filled 2022-06-05 (×2): qty 250

## 2022-06-05 MED ORDER — PROPOFOL 10 MG/ML IV BOLUS
INTRAVENOUS | Status: DC | PRN
  Administered 2022-06-05: 120 mg via INTRAVENOUS

## 2022-06-05 MED ORDER — LIDOCAINE 2% (20 MG/ML) 5 ML SYRINGE
INTRAMUSCULAR | Status: AC
  Filled 2022-06-05: qty 5

## 2022-06-05 MED ORDER — PROTAMINE SULFATE 10 MG/ML IV SOLN
INTRAVENOUS | Status: AC
  Filled 2022-06-05: qty 25

## 2022-06-05 MED ORDER — NICARDIPINE HCL IN NACL 20-0.86 MG/200ML-% IV SOLN
3.0000 mg/h | INTRAVENOUS | Status: DC
  Administered 2022-06-05: 5 mg/h via INTRAVENOUS
  Filled 2022-06-05 (×2): qty 200

## 2022-06-05 MED ORDER — 0.9 % SODIUM CHLORIDE (POUR BTL) OPTIME
TOPICAL | Status: DC | PRN
  Administered 2022-06-05: 4000 mL

## 2022-06-05 MED ORDER — PHENYLEPHRINE 80 MCG/ML (10ML) SYRINGE FOR IV PUSH (FOR BLOOD PRESSURE SUPPORT)
PREFILLED_SYRINGE | INTRAVENOUS | Status: DC | PRN
  Administered 2022-06-05 (×2): 80 ug via INTRAVENOUS
  Administered 2022-06-05: 160 ug via INTRAVENOUS

## 2022-06-05 MED ORDER — NITROGLYCERIN IN D5W 200-5 MCG/ML-% IV SOLN: 0.0000 ug/min | INTRAVENOUS | Status: DC

## 2022-06-05 MED ORDER — SODIUM CHLORIDE (PF) 0.9 % IJ SOLN: OROMUCOSAL | Status: DC | PRN

## 2022-06-05 MED ORDER — ASPIRIN 81 MG PO CHEW
324.0000 mg | CHEWABLE_TABLET | Freq: Every day | ORAL | Status: DC
  Administered 2022-06-06 – 2022-06-11 (×2): 324 mg
  Filled 2022-06-05 (×2): qty 4

## 2022-06-05 MED ORDER — SODIUM CHLORIDE 0.9% IV SOLUTION: Freq: Once | INTRAVENOUS | Status: DC

## 2022-06-05 MED ORDER — VANCOMYCIN HCL IN DEXTROSE 1-5 GM/200ML-% IV SOLN
1000.0000 mg | Freq: Once | INTRAVENOUS | Status: AC
  Administered 2022-06-05: 1000 mg via INTRAVENOUS
  Filled 2022-06-05: qty 200

## 2022-06-05 MED ORDER — NITROGLYCERIN IN D5W 200-5 MCG/ML-% IV SOLN
INTRAVENOUS | Status: DC | PRN
  Administered 2022-06-05: 16.6 ug/min via INTRAVENOUS

## 2022-06-05 MED ORDER — PROPOFOL 10 MG/ML IV BOLUS
INTRAVENOUS | Status: AC
  Filled 2022-06-05: qty 20

## 2022-06-05 MED ORDER — BISACODYL 10 MG RE SUPP
10.0000 mg | Freq: Every day | RECTAL | Status: DC
  Administered 2022-06-06: 10 mg via RECTAL
  Filled 2022-06-05 (×2): qty 1

## 2022-06-05 MED ORDER — INSULIN REGULAR(HUMAN) IN NACL 100-0.9 UT/100ML-% IV SOLN
INTRAVENOUS | Status: DC
  Administered 2022-06-05: 0.5 [IU]/h via INTRAVENOUS
  Filled 2022-06-05: qty 100

## 2022-06-05 MED ORDER — NOREPINEPHRINE 4 MG/250ML-% IV SOLN
0.0000 ug/min | INTRAVENOUS | Status: DC
  Administered 2022-06-05: 4 ug/min via INTRAVENOUS
  Administered 2022-06-06: 10 ug/min via INTRAVENOUS
  Filled 2022-06-05 (×3): qty 250

## 2022-06-05 MED ORDER — SODIUM CHLORIDE 0.9% FLUSH: 3.0000 mL | INTRAVENOUS | Status: DC | PRN

## 2022-06-05 MED ORDER — CEFAZOLIN SODIUM-DEXTROSE 1-4 GM/50ML-% IV SOLN
1.0000 g | INTRAVENOUS | Status: AC
  Administered 2022-06-06: 1 g via INTRAVENOUS
  Filled 2022-06-05: qty 50

## 2022-06-05 MED ORDER — MORPHINE SULFATE (PF) 2 MG/ML IV SOLN
1.0000 mg | INTRAVENOUS | Status: DC | PRN
  Administered 2022-06-05: 2 mg via INTRAVENOUS
  Administered 2022-06-05 – 2022-06-06 (×2): 1 mg via INTRAVENOUS
  Administered 2022-06-06 (×2): 2 mg via INTRAVENOUS
  Administered 2022-06-06: 4 mg via INTRAVENOUS
  Administered 2022-06-06 – 2022-06-16 (×5): 2 mg via INTRAVENOUS
  Filled 2022-06-05 (×9): qty 1
  Filled 2022-06-05: qty 2
  Filled 2022-06-05: qty 1

## 2022-06-05 MED ORDER — SODIUM CHLORIDE 0.9% FLUSH
3.0000 mL | Freq: Two times a day (BID) | INTRAVENOUS | Status: DC
  Administered 2022-06-06 – 2022-06-18 (×21): 3 mL via INTRAVENOUS

## 2022-06-05 MED ORDER — ROCURONIUM BROMIDE 10 MG/ML (PF) SYRINGE
PREFILLED_SYRINGE | INTRAVENOUS | Status: DC | PRN
  Administered 2022-06-05: 50 mg via INTRAVENOUS
  Administered 2022-06-05: 100 mg via INTRAVENOUS
  Administered 2022-06-05: 50 mg via INTRAVENOUS

## 2022-06-05 MED ORDER — CEFAZOLIN SODIUM-DEXTROSE 1-4 GM/50ML-% IV SOLN: 1.0000 g | INTRAVENOUS | Status: DC

## 2022-06-05 MED ORDER — ACETAMINOPHEN 650 MG RE SUPP
650.0000 mg | Freq: Once | RECTAL | Status: AC
  Administered 2022-06-05: 650 mg via RECTAL

## 2022-06-05 MED ORDER — DEXTROSE 50 % IV SOLN: 0.0000 mL | INTRAVENOUS | Status: DC | PRN

## 2022-06-05 MED ORDER — MIDAZOLAM HCL 2 MG/2ML IJ SOLN
2.0000 mg | INTRAMUSCULAR | Status: DC | PRN
  Administered 2022-06-06: 2 mg via INTRAVENOUS
  Filled 2022-06-05: qty 2

## 2022-06-05 MED ORDER — CHLORHEXIDINE GLUCONATE 0.12 % MT SOLN
15.0000 mL | OROMUCOSAL | Status: AC
  Administered 2022-06-05: 15 mL via OROMUCOSAL
  Filled 2022-06-05: qty 15

## 2022-06-05 MED ORDER — MIDAZOLAM HCL (PF) 10 MG/2ML IJ SOLN
INTRAMUSCULAR | Status: AC
  Filled 2022-06-05: qty 2

## 2022-06-05 MED ORDER — SODIUM CHLORIDE 0.9 % IV SOLN: 250.0000 mL | INTRAVENOUS | Status: DC

## 2022-06-05 MED ORDER — DOCUSATE SODIUM 100 MG PO CAPS
200.0000 mg | ORAL_CAPSULE | Freq: Every day | ORAL | Status: DC
  Administered 2022-06-07 – 2022-06-08 (×2): 200 mg via ORAL
  Filled 2022-06-05 (×2): qty 2

## 2022-06-05 MED ORDER — ASPIRIN 325 MG PO TBEC
325.0000 mg | DELAYED_RELEASE_TABLET | Freq: Every day | ORAL | Status: DC
  Administered 2022-06-07 – 2022-06-19 (×12): 325 mg via ORAL
  Filled 2022-06-05 (×12): qty 1

## 2022-06-05 MED ORDER — BISACODYL 5 MG PO TBEC
10.0000 mg | DELAYED_RELEASE_TABLET | Freq: Every day | ORAL | Status: DC
  Administered 2022-06-07 – 2022-06-18 (×10): 10 mg via ORAL
  Filled 2022-06-05 (×10): qty 2

## 2022-06-05 MED ORDER — METOPROLOL TARTRATE 25 MG/10 ML ORAL SUSPENSION: 12.5000 mg | Freq: Two times a day (BID) | ORAL | Status: DC

## 2022-06-05 MED ORDER — LIDOCAINE 2% (20 MG/ML) 5 ML SYRINGE
INTRAMUSCULAR | Status: DC | PRN
  Administered 2022-06-05: 20 mg via INTRAVENOUS

## 2022-06-05 MED ORDER — ONDANSETRON HCL 4 MG/2ML IJ SOLN
4.0000 mg | Freq: Four times a day (QID) | INTRAMUSCULAR | Status: DC | PRN
  Administered 2022-06-09 – 2022-06-18 (×3): 4 mg via INTRAVENOUS
  Filled 2022-06-05 (×3): qty 2

## 2022-06-05 MED ORDER — ACETAMINOPHEN 500 MG PO TABS
1000.0000 mg | ORAL_TABLET | Freq: Four times a day (QID) | ORAL | Status: AC
  Administered 2022-06-07 – 2022-06-10 (×13): 1000 mg via ORAL
  Filled 2022-06-05 (×13): qty 2

## 2022-06-05 SURGICAL SUPPLY — 88 items
ADH SKN CLS APL DERMABOND .7 (GAUZE/BANDAGES/DRESSINGS) ×2
BAG DECANTER FOR FLEXI CONT (MISCELLANEOUS) ×6 IMPLANT
BLADE CLIPPER SURG (BLADE) ×3 IMPLANT
BLADE STERNUM SYSTEM 6 (BLADE) ×3 IMPLANT
BLADE SURG 11 STRL SS (BLADE) IMPLANT
BLOWER MISTER CAL-MED (MISCELLANEOUS) ×3 IMPLANT
BNDG ELASTIC 4X5.8 VLCR STR LF (GAUZE/BANDAGES/DRESSINGS) ×3 IMPLANT
BNDG ELASTIC 6X5.8 VLCR STR LF (GAUZE/BANDAGES/DRESSINGS) ×3 IMPLANT
BNDG GAUZE DERMACEA FLUFF 4 (GAUZE/BANDAGES/DRESSINGS) ×3 IMPLANT
BNDG GZE DERMACEA 4 6PLY (GAUZE/BANDAGES/DRESSINGS) ×2
CABLE SURGICAL S-101-97-12 (CABLE) IMPLANT
CANISTER SUCT 3000ML PPV (MISCELLANEOUS) ×3 IMPLANT
CANNULA MC2 2 STG 29/37 NON-V (CANNULA) ×3 IMPLANT
CANNULA MC2 TWO STAGE (CANNULA) ×2
CANNULA NON VENT 20FR 12 (CANNULA) ×3 IMPLANT
CLIP VESOCCLUDE MED 24/CT (CLIP) IMPLANT
CLIP VESOCCLUDE SM WIDE 24/CT (CLIP) IMPLANT
CONN ST 1/2X1/2  BEN (MISCELLANEOUS) ×2
CONN ST 1/2X1/2 BEN (MISCELLANEOUS) ×3 IMPLANT
CONNECTOR BLAKE 2:1 CARIO BLK (MISCELLANEOUS) IMPLANT
DEFOGGER ANTIFOG KIT (MISCELLANEOUS) IMPLANT
DERMABOND ADVANCED .7 DNX12 (GAUZE/BANDAGES/DRESSINGS) IMPLANT
DRAIN CHANNEL 19F RND (DRAIN) IMPLANT
DRAPE CARDIOVASCULAR INCISE (DRAPES) ×2
DRAPE INCISE IOBAN 66X45 STRL (DRAPES) ×3 IMPLANT
DRAPE SRG 135.5X100X77XCV INC (DRAPES) IMPLANT
DRAPE WARM FLUID 44X44 (DRAPES) ×3 IMPLANT
DRSG AQUACEL AG ADV 3.5X10 (GAUZE/BANDAGES/DRESSINGS) IMPLANT
DRSG COVADERM 4X14 (GAUZE/BANDAGES/DRESSINGS) ×3 IMPLANT
ELECT BLADE 4.0 EZ CLEAN MEGAD (MISCELLANEOUS) ×2
ELECT REM PT RETURN 9FT ADLT (ELECTROSURGICAL) ×4
ELECTRODE BLDE 4.0 EZ CLN MEGD (MISCELLANEOUS) IMPLANT
ELECTRODE REM PT RTRN 9FT ADLT (ELECTROSURGICAL) ×6 IMPLANT
FELT TEFLON 1X6 (MISCELLANEOUS) ×6 IMPLANT
GAUZE SPONGE 4X4 12PLY STRL (GAUZE/BANDAGES/DRESSINGS) ×6 IMPLANT
GLOVE BIO SURGEON STRL SZ 6.5 (GLOVE) IMPLANT
GLOVE BIO SURGEON STRL SZ7 (GLOVE) ×6 IMPLANT
GLOVE BIOGEL PI IND STRL 7.0 (GLOVE) IMPLANT
GLOVE BIOGEL PI IND STRL 7.5 (GLOVE) ×6 IMPLANT
GLOVE INDICATOR 7.5 STRL GRN (GLOVE) IMPLANT
GLOVE SS BIOGEL STRL SZ 6 (GLOVE) IMPLANT
GLOVE SURG SS PI 7.0 STRL IVOR (GLOVE) IMPLANT
GOWN STRL REUS W/ TWL LRG LVL3 (GOWN DISPOSABLE) ×12 IMPLANT
GOWN STRL REUS W/ TWL XL LVL3 (GOWN DISPOSABLE) ×6 IMPLANT
GOWN STRL REUS W/TWL LRG LVL3 (GOWN DISPOSABLE) ×10
GOWN STRL REUS W/TWL XL LVL3 (GOWN DISPOSABLE) ×4
HEMOSTAT POWDER SURGIFOAM 1G (HEMOSTASIS) ×9 IMPLANT
INSERT SUTURE HOLDER (MISCELLANEOUS) ×3 IMPLANT
KIT BASIN OR (CUSTOM PROCEDURE TRAY) ×3 IMPLANT
KIT SUCTION CATH 14FR (SUCTIONS) ×9 IMPLANT
KIT TURNOVER KIT B (KITS) ×3 IMPLANT
KIT VASOVIEW HEMOPRO 2 VH 4000 (KITS) ×3 IMPLANT
LEAD PACING MYOCARDI (MISCELLANEOUS) ×3 IMPLANT
MARKER GRAFT CORONARY BYPASS (MISCELLANEOUS) ×9 IMPLANT
NS IRRIG 1000ML POUR BTL (IV SOLUTION) ×15 IMPLANT
OFFPUMP STABILIZER SUV (MISCELLANEOUS) ×3 IMPLANT
PACK E OPEN HEART (SUTURE) ×3 IMPLANT
PACK OPEN HEART (CUSTOM PROCEDURE TRAY) ×3 IMPLANT
PAD ARMBOARD 7.5X6 YLW CONV (MISCELLANEOUS) ×6 IMPLANT
PAD ELECT DEFIB RADIOL ZOLL (MISCELLANEOUS) ×3 IMPLANT
PENCIL BUTTON HOLSTER BLD 10FT (ELECTRODE) ×3 IMPLANT
POSITIONER ACROBAT-I OFFPUMP (MISCELLANEOUS) ×3 IMPLANT
POSITIONER HEAD DONUT 9IN (MISCELLANEOUS) ×3 IMPLANT
PUNCH AORTIC ROTATE 4.0MM (MISCELLANEOUS) IMPLANT
PUNCH AORTIC ROTATE 4.5MM 8IN (MISCELLANEOUS) IMPLANT
SHUNT FLO COIL 1.50MM (MISCELLANEOUS) IMPLANT
SPONGE T-LAP 18X18 ~~LOC~~+RFID (SPONGE) IMPLANT
SUPPORT HEART JANKE-BARRON (MISCELLANEOUS) ×3 IMPLANT
SUT BONE WAX W31G (SUTURE) ×3 IMPLANT
SUT MNCRL AB 3-0 PS2 18 (SUTURE) ×6 IMPLANT
SUT MNCRL AB 4-0 PS2 18 (SUTURE) IMPLANT
SUT PDS AB 1 CTX 36 (SUTURE) ×6 IMPLANT
SUT PROLENE 3 0 SH DA (SUTURE) ×3 IMPLANT
SUT PROLENE 5 0 C 1 36 (SUTURE) ×9 IMPLANT
SUT PROLENE BLUE 7 0 (SUTURE) ×3 IMPLANT
SUT STEEL 6MS V (SUTURE) ×6 IMPLANT
SUT STEEL STERNAL CCS#1 18IN (SUTURE) IMPLANT
SUT VIC AB 2-0 CT1 36 (SUTURE) IMPLANT
SYSTEM SAHARA CHEST DRAIN ATS (WOUND CARE) ×3 IMPLANT
TAPE CLOTH SURG 4X10 WHT LF (GAUZE/BANDAGES/DRESSINGS) IMPLANT
TAPE PAPER 2X10 WHT MICROPORE (GAUZE/BANDAGES/DRESSINGS) IMPLANT
TAPES RETRACTO (MISCELLANEOUS) ×6 IMPLANT
TOWEL GREEN STERILE (TOWEL DISPOSABLE) ×3 IMPLANT
TOWEL GREEN STERILE FF (TOWEL DISPOSABLE) ×3 IMPLANT
TRAY FOLEY SLVR 16FR TEMP STAT (SET/KITS/TRAYS/PACK) ×3 IMPLANT
TUBING LAP HI FLOW INSUFFLATIO (TUBING) ×3 IMPLANT
UNDERPAD 30X36 HEAVY ABSORB (UNDERPADS AND DIAPERS) ×3 IMPLANT
WATER STERILE IRR 1000ML POUR (IV SOLUTION) ×6 IMPLANT

## 2022-06-05 NOTE — Brief Op Note (Signed)
05/30/2022 - 06/05/2022  3:18 PM  PATIENT:  [name]  71 y.o. male  PRE-OPERATIVE DIAGNOSIS:  Coronary Artery Disease  POST-OPERATIVE DIAGNOSIS:  Coronary Artery Disease  PROCEDURE:   OFF PUMP CORONARY ARTERY BYPASS GRAFTING (CABG) X BYPASSES USING LEFT INTERNAL MAMMARY ARTERY AND RIGHT LEG GREATER SAPHENOUS VEIN HARVESTED ENDOSCOPICALLY (N/A) -LIMA to LAD -SVG to PDA TRANSESOPHAGEAL ECHOCARDIOGRAM (TEE) (N/A) Vein harvest time: 29mn Vein prep time: 235m  SURGEON:  Surgeon(s) and Role: Lightfoot, HaLucile CraterMD - Primary  PHYSICIAN ASSISTANT: BaWynelle BeckmannA-C, DoLars PinksA-C  ASSISTANTS: MaAra KussmaulNFA   ANESTHESIA:   general  EBL:  475 mL   BLOOD ADMINISTERED:none  DRAINS:  Mediastinal and pleural chest tubes    LOCAL MEDICATIONS USED:  NONE  SPECIMEN:  No Specimen  DISPOSITION OF SPECIMEN:  N/A  COUNTS CORRECT:  YES  DICTATION: .Dragon Dictation  PLAN OF CARE: Admit to inpatient   PATIENT DISPOSITION:  ICU - intubated and hemodynamically stable.   Delay start of Pharmacological VTE agent (>24hrs) due to surgical blood loss or risk of bleeding: yes

## 2022-06-05 NOTE — Discharge Summary (Incomplete)
[address]       [place] [address]             [phone]    Physician Discharge Summary  Patient ID: [name] [name] MRN: [id number] DOB/AGE: 72-Oct-1952 72 y.o.  Admit date: 05/30/2022 Discharge date: 06/19/2022  Admission Diagnoses:  Patient Active Problem List   Diagnosis Date Noted   OSA (obstructive sleep apnea) 06/15/2022   Hypertension associated with diabetes ([place]) 06/15/2022   Heart block AV second degree 06/15/2022   Peritoneal dialysis catheter in place [facility]) 06/14/2022   End stage renal disease on dialysis ([place]) 06/14/2022   Hyperglycemia 06/12/2022   Shock ([place]) 06/07/2022   Acute respiratory failure with hypoxia ([place]) 06/07/2022   AV block 06/07/2022   S/P CABG x 2 06/05/2022   Second degree AV block, Mobitz type I 12/27/2021   Unstable angina (HCC)    NSTEMI (non-ST elevated myocardial infarction) [facility])    Rectal bleeding 07/13/2021   Allergic rhinitis 04/07/2020   Asthma 10/02/2019   Chest pain 08/29/2018   Constipation 02/28/2018   ESRD (end stage renal disease) ([place]) 11/13/2017   Foul smelling urine 12/14/2016   Coronary artery disease involving native coronary artery of native heart without angina pectoris 10/27/2016   Chronic kidney disease, stage IV (severe) ([place]) 10/27/2016   Morbid obesity due to excess calories ([place]) 03/22/2016   CAD (coronary artery disease)    Erectile dysfunction 08/16/2015   Hemorrhoids 07/05/2015   Vitamin D deficiency 06/04/2015   OSA on CPAP 05/14/2015   Normocytic anemia 03/24/2015   Fatty liver 12/01/2014   Back pain 06/05/2013   OA (osteoarthritis) of knee 03/15/2011   CTS (carpal tunnel syndrome) 03/15/2011   Diabetes mellitus with stage 5 chronic kidney disease ([place]) 09/29/2008   Hyperlipidemia 09/29/2008   Essential hypertension 09/29/2008   GERD 09/29/2008     Discharge Diagnoses:  Patient Active Problem List   Diagnosis Date Noted   OSA (obstructive sleep apnea) 06/15/2022    Hypertension associated with diabetes ([place]) 06/15/2022   Heart block AV second degree 06/15/2022   Peritoneal dialysis catheter in place [facility]) 06/14/2022   End stage renal disease on dialysis ([place]) 06/14/2022   Hyperglycemia 06/12/2022   Shock ([place]) 06/07/2022   Acute respiratory failure with hypoxia ([place]) 06/07/2022   AV block 06/07/2022   S/P CABG x 2 06/05/2022   Second degree AV block, Mobitz type I 12/27/2021   Unstable angina (HCC)    NSTEMI (non-ST elevated myocardial infarction) [facility])    Rectal bleeding 07/13/2021   Allergic rhinitis 04/07/2020   Asthma 10/02/2019   Chest pain 08/29/2018   Constipation 02/28/2018   ESRD (end stage renal disease) ([place]) 11/13/2017   Foul smelling urine 12/14/2016   Coronary artery disease involving native coronary artery of native heart without angina pectoris 10/27/2016   Chronic kidney disease, stage IV (severe) ([place]) 10/27/2016   Morbid obesity due to excess calories ([place]) 03/22/2016   CAD (coronary artery disease)    Erectile dysfunction 08/16/2015   Hemorrhoids 07/05/2015   Vitamin D deficiency 06/04/2015   OSA on CPAP 05/14/2015   Normocytic anemia 03/24/2015   Fatty liver 12/01/2014   Back pain 06/05/2013   OA (osteoarthritis) of knee 03/15/2011   CTS (carpal tunnel syndrome) 03/15/2011   Diabetes mellitus with stage 5 chronic kidney disease ([place]) 09/29/2008   Hyperlipidemia 09/29/2008   Essential hypertension 09/29/2008   GERD 09/29/2008    Discharged Condition: stable  HPI: [name] with a past history of hypertension, end-stage renal disease managed with nocturnal peritoneal dialysis, type 2 diabetes mellitus, obstructive sleep apnea, second-degree Mobitz type I heart block with known nocturnal complete heart block followed by Dr. Caryl Comes.  [name] [name] is a former 25-pack-year tobacco smoker having quit about 12 years ago.  He has known coronary artery disease presented with acute angina in January of this  year.  He was found to have severe obstructive disease of the right coronary artery treated with a drug-eluting stent.  He returned in June of this year with recurring symptoms and was found to have an in-stent stenosis treated with drug-eluting stent.  He has been on aspirin and Brilinta since then.  He has continued to have angina off and on but had a particularly intense episode prior to his admission 2 days ago.  Work-up in the emergency room included high-sensitivity troponin elevated at 100 and was "level was 142.  EKG showed second-degree Mobitz type I heart block similar to previous EKGs.  He had no acute ST or T wave abnormalities.  He was admitted to the hospital and treated medically for his angina with no further chest pain.  He underwent left heart catheterization earlier today demonstrating severe in-stent RCA stenosis as well as progression of his LAD stenosis from 50% back in June to 75% today.  It is an irregular lesion. CT surgery has been asked to evaluate [name] [name] for consideration of coronary bypass grafting.  Repeat echo gram is pending but of the echo obtained earlier this year shows preserved biventricular function with left ventricular ejection fraction of 65 to 70%.  There was mitral and aortic annular calcification but no peripheral or functional valve abnormalities. Dr. Kipp Brood discussed the need for coronary artery bypass grafting surgery. Potential risks, benefits, and complications of the surgery were discussed with the patient and he agreed to proceed with surgery.  Hospital Course:  The patient underwent an off pump CABG x 2 utilizing LIMA to LAD and SVG to PDA. He was transferred from the OR to [facility] ICU in stable condition. He failed 2 attempts to extubate the evening of surgery. He was extubated without complication on POD1. His drips were weaned as tolerated. He was started on low dose lopressor. Temporary HD catheter was placed by CCM for HD. He has T2DM, preop A1C was  5.9. CBGs were controlled, his insulin drip was discontinued and sliding scale insulin was started. He was started on CRRT by Nephrology. He was transfused packed red blood cells for blood loss anemia. He remained bradycardic so EP was consulted and recommended PPM placement prior to discharge as well as avoiding nodal blocking agents. Lopressor was held due to this. His chest tubes were removed without complication. His hemoglobin and hematocrit continued trending down so he was transfused with 1U packed red blood cells for blood loss anemia. On POD4 he remained on Levophed, Midodrine was started and titrated to 51m TID to assist wean. He remained on CRRT per nephrology. He was V paced due to underlying 2nd degree Mobitz type 1 heart block and bradycardia.  On postop day 6 all pressors were weaned off. CRRT clotted on 11/26 and was not restarted. Peritoneal dialysis was started per nephrology and tolerated well. Pacing was turned down to VVI 50, he continued to require intermittent pacing. He remained in the ICU as had second degree heart block (Mobitz 1) with intermittent pacing. Per EP evaluation 11/29, if patient ends up needing  PPM, would likely require a leadless to minimize risk of infection. EP ultimately determined that because of lack of daytime pacing or symptoms, there is no plan for PPM at this time. [name] [name] transferred from the ICU to 4E on 06/17/2022.  Peritoneal dialysis was continued.  He remained in a second-degree Mobitz type I heart block with occasional pacing with the pacer in backup mode at a rate of 50. EP decreased back up pacer rate to 35. Of note, patient asymptomatic when he had bradycardia.Per Dr. Kipp Brood, epicardial pacing wires were removed on 12/04. EKG showed second degree heart block. Nurse documented brief oxygen desaturation to 87%, put on oxygen. Since then, he is on room air with good oxygenation and does not meet criteria for home oxygen. He will start Plavix for NSTEMI  later on this evening 12/04. Per nephrology, patient to continue PD dialysis (has been doing for 5 years).Per Dr. Kipp Brood, patient to be discharged today.   Consults: pulmonary/intensive care, nephrology, and EP  Significant Diagnostic Studies:   CLINICAL DATA:  Status post CABG   EXAM: PORTABLE CHEST 1 VIEW   COMPARISON:  05/30/2022   FINDINGS: Endotracheal tube terminates 2.7 cm above carina. Right internal jugular line terminates at the mid to low SVC. Interval median sternotomy for CABG.   Cardiomegaly accentuated by AP portable technique. Right and probable left-sided chest tubes. Possible small left pleural effusion. No pneumothorax. Mild interstitial edema, accentuated by extremely low lung volumes. Mild left base compressive atelectasis.   IMPRESSION: Appropriate position of support apparatus, without pneumothorax or other complication.   Congestive heart failure with probable small left pleural effusion and adjacent atelectasis.     Electronically Signed   By: Abigail Miyamoto M.D.   On: 06/05/2022 13:14  Narrative & Impression  CLINICAL DATA:  Recent CABG.   EXAM: PORTABLE CHEST 1 VIEW   COMPARISON:  CXR 06/09/22   FINDINGS: Interval removal of the right-sided central venous catheter sheath. Left-sided central venous catheter is unchanged in positioning likely terminating near the confluence of the brachiocephalic veins. Status post median sternotomy and CABG. Unchanged cardiac and mediastinal contours.   Likely trace left pleural effusion. Low lung volumes. No focal airspace opacity. Prominent interstitial opacities are favored to represent bronchovascular crowding in the setting of low lung volumes. No pneumothorax. Visualized upper abdomen is unremarkable. No displaced rib fractures.   IMPRESSION: Possible trace left pleural effusion.     Electronically Signed   By: Marin Roberts M.D.   On: 06/15/2022 08:04      Treatments: surgery:  Off  pump CABG X 2.  LIMA LAD, RSVG PDA   Endoscopic greater saphenous vein harvest on the right by Dr. Kipp Brood on 06/05/2022.    Discharge Exam: Blood pressure 104/73, pulse 87, temperature (S) 98.3 F (36.8 C), temperature source Oral, resp. rate 20, height _0  (1.626 m), weight 102.8 kg, SpO2 94 %.  Cardiovascular: Per tele, second degree heart block, RRR Pulmonary: Clear to auscultation bilaterally; Abdomen: Soft, non tender, bowel sounds present. Extremities: Mild bilateral lower extremity edema. Wounds: Clean and dry.  No erythema or signs of infection.  Discharge Medications:  Discharge Instructions     Amb Referral to Cardiac Rehabilitation   Complete by: As directed    Diagnosis:  NSTEMI CABG     CABG X ___: 2   After initial evaluation and assessments completed: Virtual Based Care may be provided alone or in conjunction with Phase 2 Cardiac Rehab based on patient barriers.: Yes  Intensive Cardiac Rehabilitation (ICR) [place] location only OR Traditional Cardiac Rehabilitation (TCR) *If criteria for ICR are not met will enroll in TCR [facility] only): Yes      Allergies as of 06/19/2022       Reactions   Opana [oxymorphone Hcl] Itching   Tramadol Itching        Medication List     STOP taking these medications    Brilinta 90 MG Tabs tablet Generic drug: ticagrelor   isosorbide mononitrate 60 MG 24 hr tablet Commonly known as: IMDUR   nitroGLYCERIN 0.4 MG SL tablet Commonly known as: NITROSTAT   simvastatin 20 MG tablet Commonly known as: ZOCOR   torsemide 100 MG tablet Commonly known as: DEMADEX       TAKE these medications    albuterol 108 (90 Base) MCG/ACT inhaler Commonly known as: VENTOLIN HFA Inhale 2 puffs into the lungs every 6 (six) hours as needed for wheezing or shortness of breath.   aspirin EC 81 MG tablet Take 81 mg by mouth daily. Swallow whole.   atorvastatin 80 MG tablet Commonly known as: LIPITOR Take 1 tablet (80 mg total) by  mouth daily.   calcitRIOL 0.25 MCG capsule Commonly known as: ROCALTROL Take 0.25 mcg by mouth daily.   clopidogrel 75 MG tablet Commonly known as: Plavix Take 1 tablet (75 mg total) by mouth daily. Please take first dose at 9 pm on 12/4   Droplet Pen Needles 31G X 5 MM Misc Generic drug: Insulin Pen Needle USE TO INJECT INSULIN DAILY AS DIRECTED   Lantus SoloStar 100 UNIT/ML Solostar Pen Generic drug: insulin glargine Inject 22 Units into the skin at bedtime.   linagliptin 5 MG Tabs tablet Commonly known as: Tradjenta Take 1 tablet (5 mg total) by mouth daily.   midodrine 5 MG tablet Commonly known as: PROAMATINE Take 3 tablets (15 mg total) by mouth 3 (three) times daily with meals.   OneTouch Ultra test strip Generic drug: glucose blood USE TO CHECK BLOOD SUGAR TWICE DAILY   oxybutynin 5 MG tablet Commonly known as: DITROPAN Take 1 tablet (5 mg total) by mouth 2 (two) times daily.   oxyCODONE 5 MG immediate release tablet Commonly known as: Oxy IR/ROXICODONE Take 1 tablet (5 mg total) by mouth every 6 (six) hours as needed for severe pain.   pantoprazole 40 MG tablet Commonly known as: PROTONIX Take 1 tablet (40 mg total) by mouth 2 (two) times daily before a meal.   sevelamer 800 MG tablet Commonly known as: RENAGEL Take 2 tablets (1,600 mg total) by mouth 3 (three) times daily with meals. What changed:  how much to take additional instructions   Vitamin D (Ergocalciferol) 1.25 MG (50000 UNIT) Caps capsule Commonly known as: DRISDOL Take 50,000 Units by mouth every 7 (seven) days. Monday      The patient has been discharged on:   1.Beta Blocker:  Yes [   ]                              No   [  x ]                              If No, reason:Bradycardia, second degree heart block  2.Ace Inhibitor/ARB: Yes [   ]  No  [ x   ]                                     If No, reason:ESRD, labile BP  3.Statin:   Yes [x   ]                   No  [   ]                  If No, reason:  4.Ecasa:  Yes  [  x ]                  No   [   ]                  If No, reason:  Patient had ACS upon admission:Yes  Plavix/P2Y12 inhibitor: Yes [  x ]                                      No  [   ]  Follow-up Information     Lightfoot, Lucile Crater, MD Follow up.   Specialty: Cardiothoracic Surgery Why: Appointment is VIRTUAL. Please do NOT go to the office. Dr. Kipp Brood will call you on 06/22/2022 at 1:25 pm Contact information: [address] [place] [address] [phone]         Warren Lacy, PA-C Follow up on 06/28/2022.   Specialty: Internal Medicine Why: Cardiology appointment at 10:05AM Contact information: [address]  [place] [address] [place]. Follow up.   Why: Latricia Heft)- office protocol referral for [facility] needs- they will contact you to schedule Contact information: [place] [address] [place] Follow up.   Why: ([phone]) bedside commode arranged- to be delivered to room prior to discharge                Signed:  Nani Skillern, PA-C 06/20/2022, 12:15 PM

## 2022-06-05 NOTE — Anesthesia Procedure Notes (Signed)
Arterial Line Insertion Start/End11/20/2023 7:05 AM, 06/05/2022 7:13 AM Performed by: Colin Benton, CRNA, CRNA  Patient location: Pre-op. Preanesthetic checklist: patient identified, IV checked, site marked, risks and benefits discussed, surgical consent, monitors and equipment checked, pre-op evaluation and anesthesia consent Lidocaine 1% used for infiltration Right, radial was placed Catheter size: 20 G Hand hygiene performed  and maximum sterile barriers used   Attempts: 1 Procedure performed without using ultrasound guided technique. Ultrasound Notes:anatomy identified, needle tip was noted to be adjacent to the nerve/plexus identified and no ultrasound evidence of intravascular and/or intraneural injection Following insertion, dressing applied and Biopatch. Post procedure assessment: normal and unchanged  Patient tolerated the procedure well with no immediate complications.

## 2022-06-05 NOTE — Plan of Care (Signed)
  Problem: Education: Goal: Understanding of cardiac disease, CV risk reduction, and recovery process will improve Outcome: Progressing Goal: Individualized Educational Video(s) Outcome: Progressing   Problem: Activity: Goal: Ability to tolerate increased activity will improve Outcome: Progressing   Problem: Cardiac: Goal: Ability to achieve and maintain adequate cardiovascular perfusion will improve Outcome: Progressing   Problem: Coping: Goal: Ability to adjust to condition or change in health will improve Outcome: Progressing   Problem: Metabolic: Goal: Ability to maintain appropriate glucose levels will improve Outcome: Progressing   Problem: Skin Integrity: Goal: Risk for impaired skin integrity will decrease Outcome: Progressing   Problem: Tissue Perfusion: Goal: Adequacy of tissue perfusion will improve Outcome: Progressing

## 2022-06-05 NOTE — Anesthesia Procedure Notes (Signed)
Procedure Name: Intubation Date/Time: 06/05/2022 8:05 AM  Performed by: Harden Mo, CRNAPre-anesthesia Checklist: Patient identified, Emergency Drugs available, Suction available and Patient being monitored Patient Re-evaluated:Patient Re-evaluated prior to induction Oxygen Delivery Method: Circle System Utilized Preoxygenation: Pre-oxygenation with 100% oxygen Induction Type: IV induction and Rapid sequence Laryngoscope Size: Glidescope and 4 Grade View: Grade I Tube type: Oral Tube size: 8.0 mm Number of attempts: 1 Airway Equipment and Method: Stylet and Oral airway Placement Confirmation: ETT inserted through vocal cords under direct vision, positive ETCO2 and breath sounds checked- equal and bilateral Secured at: 23 cm Tube secured with: Tape Dental Injury: Teeth and Oropharynx as per pre-operative assessment

## 2022-06-05 NOTE — Consult Note (Signed)
NAME:  [name] [name], MRN:  [id number], DOB:  1951-02-10, LOS: 25 ADMISSION DATE:  05/30/2022, CONSULTATION DATE: 06/05/2022 REFERRING MD: Dr. Kipp Brood, CHIEF COMPLAINT: S/p CABG  History of Present Illness:  71 year old male with end-stage renal disease on peritoneal dialysis, diabetes type 2, hypertension, hyperlipidemia, coronary artery disease, OSA and COPD, who was admitted with unstable angina, on work-up noted to have multivessel coronary artery disease underwent CABG x2, postop requiring vasopressor support and remain on full mechanical ventilatory support. Received 2 units of PRBCs in OR PCCM was consulted for help evaluation medical management  Pertinent  Medical History   Past Medical History:  Diagnosis Date   Anemia    Arthritis    Asthma    Bell palsy    CAD (coronary artery disease)    a. 2014 MV: abnl w/ infap ischemia; b. 03/2013 Cath: aneurysmal bleb in the LAD w/ otw nonobs dzs-->Med Rx.   Chronic back pain    Chronic knee pain    a. 09/2015 s/p R TKA.   Chronic pain    Chronic shoulder pain    Chronic sinusitis    COPD (chronic obstructive pulmonary disease) ([place])    Diabetes mellitus without complication ([place])    type II    ESRD on peritoneal dialysis ([place])    on peritoneal dialysis, [facility]   Essential hypertension    GERD (gastroesophageal reflux disease)    Gout    Gout    Hepatomegaly    noted on noncontrast CT 2015   History of hiatal hernia    Hyperlipidemia    Lateral meniscus tear    Obesity    Truncal   Obstructive sleep apnea    does not use cpap    On home oxygen therapy    uses 2l when is going somewhere per patient    PUD (peptic ulcer disease)    remote, reports f/u EGD about 8 years ago unremarkable    Reactive airway disease    related to exposure to chemical during 9/11   Sinusitis    Vitamin D deficiency      Significant Hospital Events: Including procedures, antibiotic start and stop dates in addition to other  pertinent events     Interim History / Subjective:    Objective   Blood pressure 127/71, pulse (!) 47, temperature 98.8 F (37.1 C), temperature source Oral, resp. rate 12, height '5\' 4"'$  (1.626 m), weight 106.8 kg, SpO2 100 %.    Vent Mode: SIMV;PRVC;PSV FiO2 (%):  [50 %] 50 % Set Rate:  [12 bmp] 12 bmp Vt Set:  [470 mL] 470 mL PEEP:  [5 cmH20] 5 cmH20 Pressure Support:  [10 cmH20] 10 cmH20 Plateau Pressure:  [21 cmH20] 21 cmH20   Intake/Output Summary (Last 24 hours) at 06/05/2022 1330 Last data filed at 06/05/2022 1235 Gross per 24 hour  Intake 5224.45 ml  Output 500 ml  Net 4724.45 ml   Filed Weights   06/04/22 0200 06/04/22 0238 06/04/22 0519  Weight: 103.5 kg 103.5 kg 106.8 kg    Examination:   Physical exam: General: Crtitically ill-appearing male, orally intubated HEENT: [place]/AT, eyes anicteric.  ETT and OGT in place Neuro: Sedated, not following commands.  Eyes are closed.  Pupils 3 mm bilateral reactive to light Chest: Median sternotomy wound is dry and well dressed, coarse breath sounds, no wheezes or rhonchi Heart: Regular rate and rhythm, no murmurs or gallops Abdomen: Soft, peritoneal dialysis catheter in place, nondistended, bowel sounds present Skin:  No rash   Resolved Hospital Problem list     Assessment & Plan:  Coronary artery disease s/p 2-vessel CABG Acute respiratory insufficiency, postop Hypertension Hyperlipidemia Diabetes type 2 End-stage renal disease on peritoneal dialysis Acute blood loss anemia, perioperative Obesity hypoventilation syndrome/OSA   Continue aspirin and statin Chest tube management TCTS Continue to titrate Precedex with RASS goal 0/-1 Monitor H&H Rapid weaning protocol ordered is in place Continue pain control with tramadol, oxycodone and IV morphine Holding antihypertensive for now, as patient is requiring low-dose Levophed and phenylephrine due to vasoplegia postop Continue insulin infusion with CBG goal  140-180 Nephrology is following for dialysis needs, recommend holding off today Monitor urine output and labs Continue CPAP at night post extubation   Best Practice (right click and "Reselect all SmartList Selections" daily)   Diet/type: NPO DVT prophylaxis: SCD GI prophylaxis: H2B Lines: Central line and yes and it is still needed Foley:  Yes, and it is still needed Code Status:  full code Last date of multidisciplinary goals of care discussion [Per primary team]  Labs   CBC: Recent Labs  Lab 05/30/22 0341 05/30/22 1507 06/01/22 0212 06/02/22 0312 06/03/22 0648 06/04/22 0557 06/05/22 0539 06/05/22 0818 06/05/22 0823 06/05/22 1148  WBC 4.4   < > 5.6 4.9 5.1 4.2 4.6  --   --   --   NEUTROABS 3.3  --  4.4 3.8 4.0  --   --   --   --   --   HGB 8.9*   < > 9.0* 8.5* 8.3* 8.3* 8.3* 6.8* 7.8* 8.5*  HCT 26.8*   < > 27.8* 27.2* 25.0* 25.8* 26.8* 20.0* 23.0* 25.0*  MCV 94.0   < > 93.3 95.4 94.0 95.2 97.1  --   --   --   PLT 126*   < > 105* 106* 90* 92* 123*  --   --   --    < > = values in this interval not displayed.    Basic Metabolic Panel: Recent Labs  Lab 05/30/22 1148 05/30/22 1507 05/31/22 0252 06/01/22 4481 06/02/22 0312 06/03/22 8563 06/05/22 0539 06/05/22 0818 06/05/22 0823 06/05/22 1148  NA  --    < > 136 136 134* 137 138 138 137 137  K  --    < > 4.0 3.9 3.9 4.1 4.3 4.8 4.8 4.1  CL  --   --  98 96* 94* 98 98 98  --   --   CO2  --   --  '27 26 26 25 27  '$ --   --   --   GLUCOSE  --   --  101* 121* 132* 127* 143* 128*  --   --   BUN  --   --  48* 50* 47* 50* 52* 49*  --   --   CREATININE  --   --  19.27* 19.46* 19.06* 20.40* 20.83* >18.00*  --   --   CALCIUM  --   --  9.4 9.3 9.0 9.2 9.1  --   --   --   MG 2.1  --   --  2.0 2.0 2.1  --   --   --   --   PHOS  --   --   --  3.3 3.3 4.0 3.6  --   --   --    < > = values in this interval not displayed.   GFR: CrCl cannot be calculated (This lab value cannot be used to  calculate CrCl because it is not a  number: >18.00). Recent Labs  Lab 06/02/22 0312 06/03/22 0648 06/04/22 0557 06/05/22 0539  WBC 4.9 5.1 4.2 4.6    Liver Function Tests: Recent Labs  Lab 05/30/22 1148 06/01/22 0212 06/02/22 0312 06/03/22 0648 06/05/22 0539  AST 14* 32 24 20  --   ALT '13 13 14 14  '$ --   ALKPHOS 68 46 42 35*  --   BILITOT 0.3 0.4 0.4 0.3  --   PROT 6.4* 5.8* 5.9* 5.9*  --   ALBUMIN 3.0* 2.6* 2.7* 2.7* 2.7*   No results for input(s): "LIPASE", "AMYLASE" in the last 168 hours. No results for input(s): "AMMONIA" in the last 168 hours.  ABG    Component Value Date/Time   PHART 7.387 06/05/2022 1148   PCO2ART 41.0 06/05/2022 1148   PO2ART 132 (H) 06/05/2022 1148   HCO3 24.7 06/05/2022 1148   TCO2 26 06/05/2022 1148   ACIDBASEDEF 1.0 01/11/2007 0010   O2SAT 99 06/05/2022 1148     Coagulation Profile: Recent Labs  Lab 06/04/22 2118  INR 1.0    Cardiac Enzymes: No results for input(s): "CKTOTAL", "CKMB", "CKMBINDEX", "TROPONINI" in the last 168 hours.  HbA1C: Hemoglobin A1C  Date/Time Value Ref Range Status  12/17/2021 12:00 AM 6.3  Final  12/19/2016 12:00 AM 6.4  Final   HbA1c, POC (controlled diabetic range)  Date/Time Value Ref Range Status  06/16/2021 10:50 AM 6.2 0.0 - 7.0 % Final  06/16/2020 10:03 AM 6.1 0.0 - 7.0 % Final   Hgb A1c MFr Bld  Date/Time Value Ref Range Status  05/30/2022 11:48 AM 5.9 (H) 4.8 - 5.6 % Final    Comment:    (NOTE) Pre diabetes:          5.7%-6.4%  Diabetes:              >6.4%  Glycemic control for   <7.0% adults with diabetes   07/22/2021 02:16 PM 6.6 (H) 4.8 - 5.6 % Final    Comment:    (NOTE) Pre diabetes:          5.7%-6.4%  Diabetes:              >6.4%  Glycemic control for   <7.0% adults with diabetes     CBG: Recent Labs  Lab 06/04/22 0831 06/04/22 1246 06/04/22 1649 06/04/22 2120 06/05/22 0550  GLUCAP 97 130* 92 140* 150*    Review of Systems:   Unable to obtain as patient is intubated and sedated  Past  Medical History:  He,  has a past medical history of Anemia, Arthritis, Asthma, Bell palsy, CAD (coronary artery disease), Chronic back pain, Chronic knee pain, Chronic pain, Chronic shoulder pain, Chronic sinusitis, COPD (chronic obstructive pulmonary disease) ([place]), Diabetes mellitus without complication ([place]), ESRD on peritoneal dialysis ([place]), Essential hypertension, GERD (gastroesophageal reflux disease), Gout, Gout, Hepatomegaly, History of hiatal hernia, Hyperlipidemia, Lateral meniscus tear, Obesity, Obstructive sleep apnea, On home oxygen therapy, PUD (peptic ulcer disease), Reactive airway disease, Sinusitis, and Vitamin D deficiency.   Surgical History:   Past Surgical History:  Procedure Laterality Date   ASAD LT SHOULDER  12/15/2008   left shoulder   AV FISTULA PLACEMENT Left 08/09/2016   Procedure: BRACHIOCEPHALIC ARTERIOVENOUS (AV) FISTULA CREATION LEFT ARM;  Surgeon: [name], MD;  Location: [facility];  Service: Vascular;  Laterality: Left;   CAPD INSERTION N/A 10/07/2018   Procedure: LAPAROSCOPIC PERITONEAL CATHETER PLACEMENT;  Surgeon: Mickeal Skinner, MD;  Location: [facility];  Service: General;  Laterality: N/A;   CATARACT EXTRACTION W/PHACO Left 03/28/2016   Procedure: CATARACT EXTRACTION PHACO AND INTRAOCULAR LENS PLACEMENT LEFT EYE;  Surgeon: [name], MD;  Location: [facility];  Service: Ophthalmology;  Laterality: Left;  CDE: 4.77   CATARACT EXTRACTION W/PHACO Right 04/11/2016   Procedure: CATARACT EXTRACTION PHACO AND INTRAOCULAR LENS PLACEMENT RIGHT EYE; CDE:  4.74;  Surgeon: [name], MD;  Location: [facility];  Service: Ophthalmology;  Laterality: Right;   COLONOSCOPY  10/15/2008   Fields: Rectal polyp obliterated, not retrieved, hemorrhoids, single ascending colon diverticulum near the CV. Next colonoscopy April 2020   COLONOSCOPY N/A 12/25/2014   SLF: 1. Colorectal polyps (2) removed 2. Small internal hemorrhoids 3. the left colon is severely redundant.  hyperplastic polyps   CORONARY STENT INTERVENTION N/A 07/25/2021   Procedure: CORONARY STENT INTERVENTION;  Surgeon: Sherren Mocha, MD;  Location: [place] [facility];  Service: Cardiovascular;  Laterality: N/A;   CORONARY STENT INTERVENTION N/A 12/26/2021   Procedure: CORONARY STENT INTERVENTION;  Surgeon: Nelva Bush, MD;  Location: [facility];  Service: Cardiovascular;  Laterality: N/A;   CORONARY STENT INTERVENTION N/A 01/20/2022   Procedure: CORONARY STENT INTERVENTION;  Surgeon: Sherren Mocha, MD;  Location: [place] [facility];  Service: Cardiovascular;  Laterality: N/A;   DOPPLER ECHOCARDIOGRAPHY     ESOPHAGOGASTRODUODENOSCOPY N/A 12/25/2014   SLF: 1. Anemia most likely due to CRI, gastritis, gastric polyps 2. Moderate non-erosive gastriits and mild duodenitis.  3.TWo large gstric polyps removed.    EYE SURGERY  12/22/2010   tear duct probing-[place]   FOREIGN BODY REMOVAL  03/29/2011   Procedure: REMOVAL FOREIGN BODY EXTREMITY;  Surgeon: Arther Abbott, MD;  [facility];  Service: Orthopedics;  Laterality: Right;  Removal Foreign Body Right Thumb   IR FLUORO GUIDE CV LINE RIGHT  08/06/2018   IR US GUIDE VASC ACCESS RIGHT  08/06/2018   KNEE ARTHROSCOPY  10/16/2007   left   KNEE ARTHROSCOPY WITH LATERAL MENISECTOMY Right 10/14/2015   Procedure: LEFT KNEE ARTHROSCOPY WITH PARTIAL LATERAL MENISECTOMY;  Surgeon: Carole Civil, MD;  [facility];  Service: Orthopedics;  Laterality: Right;   LEFT HEART CATH AND CORONARY ANGIOGRAPHY N/A 07/25/2021   Procedure: LEFT HEART CATH AND CORONARY ANGIOGRAPHY;  Surgeon: Sherren Mocha, MD;  Location: [place] [facility];  Service: Cardiovascular;  Laterality: N/A;   LEFT HEART CATH AND CORONARY ANGIOGRAPHY N/A 12/26/2021   Procedure: LEFT HEART CATH AND CORONARY ANGIOGRAPHY;  Surgeon: Nelva Bush, MD;  Location: [place] [facility];  Service: Cardiovascular;  Laterality: N/A;   LEFT HEART CATH AND CORONARY ANGIOGRAPHY N/A  01/20/2022   Procedure: LEFT HEART CATH AND CORONARY ANGIOGRAPHY;  Surgeon: Sherren Mocha, MD;  Location: [place] [facility];  Service: Cardiovascular;  Laterality: N/A;   LEFT HEART CATHETERIZATION WITH CORONARY ANGIOGRAM N/A 03/28/2013   Procedure: LEFT HEART CATHETERIZATION WITH CORONARY ANGIOGRAM;  Surgeon: Leonie Man, MD;  Location: [facility];  Service: Cardiovascular;  Laterality: N/A;   NM MYOVIEW LTD     PENILE PROSTHESIS IMPLANT N/A 08/16/2015   Procedure: PENILE PROTHESIS INFLATABLE, three piece, Excisional biopsy of Penile ulcer, Penile molding;  Surgeon: Carolan Clines, MD;  [facility];  Service: Urology;  Laterality: N/A;   PENILE PROSTHESIS IMPLANT N/A 12/24/2017   Procedure: REMOVAL AND  REPLACEMENT  COLOPLAST PENILE PROSTHESIS;  Surgeon: Lucas Mallow, MD;  [facility];  Service: Urology;  Laterality: N/A;   QUADRICEPS  TENDON REPAIR  07/21/2011   Procedure: REPAIR QUADRICEP TENDON;  Surgeon: Arther Abbott, MD;  [facility];  Service: Orthopedics;  Laterality: Right;   RIGHT/LEFT HEART CATH AND CORONARY ANGIOGRAPHY N/A 05/30/2022   Procedure: RIGHT/LEFT HEART CATH AND CORONARY ANGIOGRAPHY;  Surgeon: Jettie Booze, MD;  Location: [place] [facility];  Service: Cardiovascular;  Laterality: N/A;   TOENAIL EXCISION     removed [id number]   UMBILICAL HERNIA REPAIR  07/17/2005   [place]     Social History:   reports that he quit smoking about 12 years ago. His smoking use included cigarettes. He has a 25.00 pack-year smoking history. He has never used smokeless tobacco. He reports that he does not currently use alcohol. He reports current drug use. Drug: Marijuana.   Family History:  His family history includes Arthritis in an other family member; Asthma in an other family member; Cancer in his mother; Diabetes in his father, mother, and sister; Hypertension in his father, mother, and sister; Lung disease in an other family member. There is  no history of Anesthesia problems, Hypotension, Malignant hyperthermia, Pseudochol deficiency, or Colon cancer.   Allergies Allergies  Allergen Reactions   Opana [Oxymorphone Hcl] Itching   Tramadol Itching     Home Medications  Prior to Admission medications   Medication Sig Start Date End Date Taking? Authorizing Provider  albuterol (VENTOLIN HFA) 108 (90 Base) MCG/ACT inhaler Inhale 2 puffs into the lungs every 6 (six) hours as needed for wheezing or shortness of breath. 03/25/21  Yes Rigoberto Noel, MD  aspirin EC 81 MG tablet Take 81 mg by mouth daily. Swallow whole.   Yes [provider]  calcitRIOL (ROCALTROL) 0.25 MCG capsule Take 0.25 mcg by mouth daily.   Yes [provider]  insulin glargine (LANTUS SOLOSTAR) 100 UNIT/ML Solostar Pen Inject 22 Units into the skin at bedtime. 04/12/22  Yes Brita Romp, NP  isosorbide mononitrate (IMDUR) 60 MG 24 hr tablet TAKE 1 TABLET(60 MG) BY MOUTH DAILY Patient taking differently: Take 60 mg by mouth daily. 05/05/22  Yes Troy Sine, MD  nitroGLYCERIN (NITROSTAT) 0.4 MG SL tablet Place 1 tablet (0.4 mg total) under the tongue every 5 (five) minutes as needed for chest pain. 12/14/21  Yes Maudie Flakes, MD  pantoprazole (PROTONIX) 40 MG tablet Take 1 tablet (40 mg total) by mouth 2 (two) times daily before a meal. 07/25/21  Yes Erenest Rasher, PA-C  sevelamer (RENAGEL) 800 MG tablet Take 2,400 mg by mouth 3 (three) times daily with meals. Take 1 tablet with snacks   Yes [provider]  simvastatin (ZOCOR) 20 MG tablet Take 20 mg by mouth every morning.   Yes [provider]  ticagrelor (BRILINTA) 90 MG TABS tablet TAKE 1 TABLET(90 MG) BY MOUTH TWICE DAILY Patient taking differently: Take 90 mg by mouth 2 (two) times daily. 04/10/22  Yes Hilty, Nadean Corwin, MD  torsemide (DEMADEX) 100 MG tablet Take 100 mg by mouth daily. 05/03/21  Yes [provider]  TRADJENTA 5 MG TABS tablet TAKE 1 TABLET  EVERY DAY Patient taking differently: Take 5 mg by mouth daily. 05/01/22  Yes Brita Romp, NP  DROPLET PEN NEEDLES 31G X 5 MM MISC USE TO INJECT INSULIN DAILY AS DIRECTED 03/17/22   Nida, Marella Chimes, MD  ONETOUCH ULTRA test strip USE TO CHECK BLOOD SUGAR TWICE DAILY 03/21/22   Brita Romp, NP  Vitamin D, Ergocalciferol, (DRISDOL) 1.25 MG (50000 UNIT)  CAPS capsule Take 50,000 Units by mouth every 7 (seven) days. Monday Patient not taking: Reported on 05/30/2022 05/03/21   [provider]     Critical care time:     Total critical care time: 34 minutes  Performed by: [place] care time was exclusive of separately billable procedures and treating other patients.   Critical care was necessary to treat or prevent imminent or life-threatening deterioration.   Critical care was time spent personally by me on the following activities: development of treatment plan with patient and/or surrogate as well as nursing, discussions with consultants, evaluation of patient's response to treatment, examination of patient, obtaining history from patient or surrogate, ordering and performing treatments and interventions, ordering and review of laboratory studies, ordering and review of radiographic studies, pulse oximetry and re-evaluation of patient's condition.   Jacky Kindle, MD [facility] See Amion for pager If no response to pager, please call [phone] until 7pm After 7pm, Please call E-link [phone]

## 2022-06-05 NOTE — Op Note (Signed)
     [address] SpringsSuite [address]       [place] [address]             [phone]                                         06/05/2022    Patient:  [name] [name] Pre-Op Dx: NSTEMI 2V CAD ESRD Obesity HTN HLP DM   Post-op Dx:  same Procedure: Off pump CABG X 2.  LIMA LAD, RSVG PDA   Endoscopic greater saphenous vein harvest on the right   Surgeon and Role:      * Rayden Dock, Lucile Crater, MD - Primary    * B. Stehler, PA-C - assisting   Anesthesia  general EBL:  551m Blood Administration: 2 units of pRBCs  Drains: 152F blake drain: L, mediastinal  Wires: ventricular Counts: correct   Indications: 89yomale admitted for NSTEMI, and 3V CAD.  He has preserved biventricular function on echo, and no significant valvular disease.  He has a hx of ESRD, HTN, and DM.  He is on peritoneal dialysis, and likely will need a temporary catheter.  He will need a Brilinta washout prior to surgery.   Findings: Small LIMA, good vein.  Large LAD target, calcified PDA target  Operative Technique: After the risks, benefits and alternatives were thoroughly discussed, the patient was brought to the operative theatre.  All invasive lines were placed in pre-op holding.  Anesthesia was induced, and the patient was prepped and draped in normal sterile fashion.  An appropriate surgical pause was performed, and pre-operative antibiotics were dosed accordingly.  We began with simultaneous incisions along the right leg for harvesting of the greater saphenous vein and the chest for the sternotomy.  In regards to the sternotomy, this was carried down with bovie cautery, and the sternum was divided with a reciprocating saw.  Meticulous hemostasis was obtained.  The left internal thoracic artery was exposed and harvested in in pedicled fashion.  The patient was systemically heparinized, and the artery was divided distally, and placed in a papaverine sponge.    The sternal elevator was removed, and a  retractor was placed.  The pericardium was divided in the midline and fashioned into a cradle with pericardial stitches.   Next, we exposed a good target on the  LAD, and fashioned an end to side anastomosis between it and the LITA.  We next exposed the posterior wall and identified a good target on PDA.  An end to side anastomosis between it and reverse saphenous vein.  Meticulous hemostasis was obtained.    A partial occludding clamp was then placed on the ascending aorta, and we created an end to side anastomosis between it and the proximal vein graft.  The proximal site was marked with a ring.  Hemostasis was obtained, and the heparin was reversed with protamine.  Chest tubes and wires were placed, and the sternum was re-approximated with with sternal wires.  The soft tissue and skin were re-approximated wth absorbable suture.    The patient tolerated the procedure without any immediate complications, and was transferred to the ICU in guarded condition.  Sakia Schrimpf OBary Leriche

## 2022-06-05 NOTE — Hospital Course (Addendum)
HPI: This is a 71 year old gentleman with a past history of hypertension, end-stage renal disease managed with nocturnal peritoneal dialysis, type 2 diabetes mellitus, obstructive sleep apnea, second-degree Mobitz type I heart block with known nocturnal complete heart block followed by Dr. Caryl Comes.  [name] [name] is a former 25-pack-year tobacco smoker having quit about 12 years ago.  He has known coronary artery disease presented with acute angina in January of this year.  He was found to have severe obstructive disease of the right coronary artery treated with a drug-eluting stent.  He returned in June of this year with recurring symptoms and was found to have an in-stent stenosis treated with drug-eluting stent.  He has been on aspirin and Brilinta since then.  He has continued to have angina off and on but had a particularly intense episode prior to his admission 2 days ago.  Work-up in the emergency room included high-sensitivity troponin elevated at 100 and was "level was 142.  EKG showed second-degree Mobitz type I heart block similar to previous EKGs.  He had no acute ST or T wave abnormalities.  He was admitted to the hospital and treated medically for his angina with no further chest pain.  He underwent left heart catheterization earlier today demonstrating severe in-stent RCA stenosis as well as progression of his LAD stenosis from 50% back in June to 75% today.  It is an irregular lesion. CT surgery has been asked to evaluate [name] [name] for consideration of coronary bypass grafting.  Repeat echo gram is pending but of the echo obtained earlier this year shows preserved biventricular function with left ventricular ejection fraction of 65 to 70%.  There was mitral and aortic annular calcification but no peripheral or functional valve abnormalities. Dr. Kipp Brood discussed the need for coronary artery bypass grafting surgery. Potential risks, benefits, and complications of the surgery were discussed with  the patient and he agreed to proceed with surgery.  Hospital Course:  The patient underwent an off pump CABG x 2 utilizing LIMA to LAD and SVG to PDA. He was transferred from the OR to [facility] ICU in stable condition. He failed 2 attempts to extubate the evening of surgery. He was extubated without complication on POD1. His drips were weaned as tolerated. He was started on low dose lopressor. Temporary HD catheter was placed by CCM for HD. He has T2DM, preop A1C was 5.9. CBGs were controlled, his insulin drip was discontinued and sliding scale insulin was started. He was started on CRRT by Nephrology. He was transfused packed red blood cells for blood loss anemia. He remained bradycardic so EP was consulted and recommended PPM placement prior to discharge as well as avoiding nodal blocking agents. Lopressor was held due to this. His chest tubes were removed without complication. His hemoglobin and hematocrit continued trending down so he was transfused with 1U packed red blood cells for blood loss anemia. On POD4 he remained on Levophed, Midodrine was started and titrated to '15mg'$  TID to assist wean. He remained on CRRT per nephrology. He was V paced due to underlying 2nd degree Mobitz type 1 heart block and bradycardia.  On postop day 6 all pressors were weaned off. CRRT clotted on 11/26 and was not restarted. Peritoneal dialysis was started per nephrology and tolerated well. Pacing was turned down to VVI 50, he continued to require intermittent pacing. He remained in the ICU as had second degree heart block (Mobitz 1) with intermittent pacing. Per EP evaluation 11/29, if patient ends up  needing PPM, would likely require a leadless to minimize risk of infection. EP ultimately determined that because of lack of daytime pacing or symptoms, there is no plan for PPM at this time. [name] [name] transferred from the ICU to 4E on 06/17/2022.  Peritoneal dialysis was continued.  He remained in a second-degree Mobitz type I  heart block with occasional pacing with the pacer in backup mode at a rate of 50. EP decreased back up pacer rate to 35. Of note, patient asymptomatic when he had bradycardia.Per Dr. Kipp Brood, epicardial pacing wires were removed on 12/04. EKG showed second degree heart block. Nurse documented brief oxygen desaturation to 87%, put on oxygen. Since then, he is on room air with good oxygenation and does not meet criteria for home oxygen. He will start Plavix for NSTEMI later on this evening 12/04. Per nephrology, patient to continue PD dialysis (has been doing for 5 years).Per Dr. Kipp Brood, patient to be discharged today.

## 2022-06-05 NOTE — Plan of Care (Signed)
  Problem: Education: Goal: Understanding of cardiac disease, CV risk reduction, and recovery process will improve 06/05/2022 2121 by Orie Fisherman, RN Outcome: Progressing 06/05/2022 2121 by Orie Fisherman, RN Outcome: Progressing 06/05/2022 2120 by Orie Fisherman, RN Outcome: Progressing Goal: Individualized Educational Video(s) 06/05/2022 2121 by Orie Fisherman, RN Outcome: Progressing 06/05/2022 2121 by Orie Fisherman, RN Outcome: Progressing 06/05/2022 2120 by Orie Fisherman, RN Outcome: Progressing   Problem: Activity: Goal: Ability to tolerate increased activity will improve 06/05/2022 2121 by Orie Fisherman, RN Outcome: Progressing 06/05/2022 2121 by Orie Fisherman, RN Outcome: Progressing 06/05/2022 2120 by Orie Fisherman, RN Outcome: Progressing   Problem: Activity: Goal: Ability to return to baseline activity level will improve 06/05/2022 2121 by Orie Fisherman, RN Outcome: Progressing 06/05/2022 2121 by Orie Fisherman, RN Outcome: Progressing 06/05/2022 2120 by Orie Fisherman, RN Outcome: Progressing   Problem: Education: Goal: Knowledge of General Education information will improve Description: Including pain rating scale, medication(s)/side effects and non-pharmacologic comfort measures 06/05/2022 2121 by Orie Fisherman, RN Outcome: Progressing 06/05/2022 2121 by Orie Fisherman, RN Outcome: Progressing 06/05/2022 2120 by Orie Fisherman, RN Outcome: Progressing   Problem: Activity: Goal: Risk for activity intolerance will decrease 06/05/2022 2121 by Orie Fisherman, RN Outcome: Progressing 06/05/2022 2121 by Orie Fisherman, RN Outcome: Progressing 06/05/2022 2120 by Orie Fisherman, RN Outcome: Progressing   Problem: Nutrition: Goal: Adequate nutrition will be maintained 06/05/2022 2121 by Orie Fisherman, RN Outcome: Progressing 06/05/2022 2121 by  Orie Fisherman, RN Outcome: Progressing 06/05/2022 2120 by Orie Fisherman, RN Outcome: Progressing

## 2022-06-05 NOTE — Progress Notes (Signed)
  Echocardiogram Echocardiogram Transesophageal has been performed.  [name] [name] 06/05/2022, 2:00 PM

## 2022-06-05 NOTE — Progress Notes (Signed)
     [address]       [place] [address]             [phone]       Some chest pain this am, on NG gtt  Vitals:   06/04/22 2124 06/05/22 0422  BP: (!) 127/59 (!) 121/56  Pulse: (!) 48 (!) 47  Resp: 18 18  Temp: 97.6 F (36.4 C) 98.8 F (37.1 C)  SpO2:  100%   Alert NAD Sinus brady EWOB  OR today for CABG  Athen Riel O Casimir Barcellos

## 2022-06-05 NOTE — Discharge Instructions (Signed)

## 2022-06-05 NOTE — Progress Notes (Signed)
[facility] NEPHROLOGY PROGRESS NOTE  Assessment/ Plan: Pt is a 71 y.o. yo male   with past medical history significant for OSA, COPD, type II DM, CAD, ESRD on PD, anemia who was presented with chest pain, and NSTEMI, seen as a consultation for the management of ESRD.   Dialysis Orders from previous admission in 01/2022.: [facility] therapies.  CCPD, 5 exchanges 3 liter fill volumes, 10 min fill time, 1 hour 20 minutes dwell, 20 minute drain.  Last fill Icodextran.  EDW 103 kg. Uses combination of 1.5% and 2.5% depending upon BP and weight    #NSTEMI/unstable angina: Cardiac cath on 11/14 with severe in in-stent restenosis in the right coronary artery and proximal LAD disease.  Underwent CABG on 11/20. Surgery following   # ESRD on PD: Continue CCPD at night, 5 exchanges, 2-2.5 L fill volume, mix of 1.5 and 2.5% solution.  He does have left upper extremity AV fistula. Patient was complaining of abdominal distention and difficulty with 2.5 L of fill volume on 11/17.  We lowered fill volume to 2 L and he tolerated well.  Continue PD.  I think we can hold RRT today and then further determine PD vs HD plan tomorrow.    # Hypertension: now with hypotension related surgery/shock. Pressors per primary   # Anemia of ESRD: Hemoglobin around 8-9.  Iron saturation 27 with high ferritin.  Received Aranesp on 11/17. Received transfusion today  #CKD-MBD: Calcium and phosphorus level acceptable.  Currently on sevelamer and calcitriol.  Subjective: Patient went for CABG today coming out on phenyl and norepi. Critically ill coming to ICU. No issues with PD last night.  Objective Vital signs in last 24 hours: Vitals:   06/04/22 1126 06/04/22 1645 06/04/22 2124 06/05/22 0422  BP: (!) 144/69 100/62 (!) 127/59 (!) 121/56  Pulse:  (!) 49 (!) 48 (!) 47  Resp:  '16 18 18  '$ Temp:  97.9 F (36.6 C) 97.6 F (36.4 C) 98.8 F (37.1 C)  TempSrc:  Oral Oral Oral  SpO2:  100%  100%  Weight:       Height:       Weight change:   Intake/Output Summary (Last 24 hours) at 06/05/2022 1304 Last data filed at 06/05/2022 1235 Gross per 24 hour  Intake 5224.45 ml  Output 500 ml  Net 4724.45 ml       Labs: RENAL PANEL Recent Labs    07/24/21 0254 07/25/21 0201 07/29/21 1410 08/14/21 2152 12/14/21 0001 12/27/21 0215 01/18/22 1149 01/18/22 1436 01/20/22 0210 01/21/22 0229 05/30/22 0341 05/30/22 1148 05/30/22 1507 05/30/22 1513 05/30/22 1517 05/31/22 0252 06/01/22 0212 06/02/22 0312 06/03/22 0648 06/05/22 0539 06/05/22 0818 06/05/22 0823 06/05/22 1148  NA 130*   < > 134* 130*   < > 133* 135  --  133* 134* 136  --    < > 135 135 136 136 134* 137 138 138 137 137  K 3.3*   < > 4.0 3.4*   < > 3.6 3.9  --  3.5 3.7 3.6  --    < > 3.5 3.5 4.0 3.9 3.9 4.1 4.3 4.8 4.8 4.1  CL 89*   < > 90* 91*   < > 95* 94*  --  95* 95* 96*  --   --   --   --  98 96* 94* 98 98 98  --   --   CO2 26   < > 27 23   < > 29  29  --  '24 27 26  '$ --   --   --   --  '27 26 26 25 27  '$ --   --   --   GLUCOSE 97   < > 124* 104*   < > 134* 116*  --  130* 144* 128*  --   --   --   --  101* 121* 132* 127* 143* 128*  --   --   BUN 73*   < > 67* 50*   < > 52* 40*  --  51* 47* 44*  --   --   --   --  48* 50* 47* 50* 52* 49*  --   --   CREATININE 20.21*   < > 20.22* 18.51*   < > 17.44* 17.26* 17.08* 19.15* 18.48* 17.29*  --   --   --   --  19.27* 19.46* 19.06* 20.40* 20.83* >18.00*  --   --   CALCIUM 9.0   < > 9.4 9.5   < > 9.1 9.8  --  9.5 9.3 9.9  --   --   --   --  9.4 9.3 9.0 9.2 9.1  --   --   --   MG  --   --  2.4  --   --   --   --   --   --   --   --  2.1  --   --   --   --  2.0 2.0 2.1  --   --   --   --   PHOS 8.8*  --   --   --   --   --   --   --   --   --   --   --   --   --   --   --  3.3 3.3 4.0 3.6  --   --   --   ALBUMIN 3.0*  2.8*  --   --  2.7*  --   --  3.2*  --   --   --   --  3.0*  --   --   --   --  2.6* 2.7* 2.7* 2.7*  --   --   --    < > = values in this interval not displayed.      Liver Function Tests: Recent Labs  Lab 06/01/22 0212 06/02/22 0312 06/03/22 0648 06/05/22 0539  AST 32 24 20  --   ALT '13 14 14  '$ --   ALKPHOS 46 42 35*  --   BILITOT 0.4 0.4 0.3  --   PROT 5.8* 5.9* 5.9*  --   ALBUMIN 2.6* 2.7* 2.7* 2.7*   No results for input(s): "LIPASE", "AMYLASE" in the last 168 hours. No results for input(s): "AMMONIA" in the last 168 hours. CBC: Recent Labs    06/02/22 0312 06/03/22 0648 06/04/22 0557 06/05/22 0539 06/05/22 0818 06/05/22 0823 06/05/22 1148  HGB 8.5*   < > 8.3* 8.3* 6.8* 7.8* 8.5*  MCV 95.4   < > 95.2 97.1  --   --   --   FERRITIN 1,105*  --   --   --   --   --   --   TIBC 293  --   --   --   --   --   --   IRON 79  --   --   --   --   --   --    < > =  values in this interval not displayed.    Cardiac Enzymes: No results for input(s): "CKTOTAL", "CKMB", "CKMBINDEX", "TROPONINI" in the last 168 hours. CBG: Recent Labs  Lab 06/04/22 0831 06/04/22 1246 06/04/22 1649 06/04/22 2120 06/05/22 0550  GLUCAP 97 130* 92 140* 150*    Iron Studies: No results for input(s): "IRON", "TIBC", "TRANSFERRIN", "FERRITIN" in the last 72 hours. Studies/Results: No results found.  Medications: Infusions:  sodium chloride     [START ON 06/06/2022] sodium chloride     sodium chloride     albumin human      ceFAZolin (ANCEF) IV     dexmedetomidine (PRECEDEX) IV infusion     dialysis solution 1.5% low-MG/low-CA     famotidine (PEPCID) IV     insulin     lactated ringers     lactated ringers     lactated ringers     magnesium sulfate     nitroGLYCERIN     norepinephrine (LEVOPHED) Adult infusion     phenylephrine (NEO-SYNEPHRINE) Adult infusion     potassium chloride     vancomycin      Scheduled Medications:  [START ON 06/06/2022] acetaminophen  1,000 mg Oral Q6H   Or   [START ON 06/06/2022] acetaminophen (TYLENOL) oral liquid 160 mg/5 mL  1,000 mg Per Tube Q6H   acetaminophen (TYLENOL) oral liquid 160 mg/5 mL  650 mg Per  Tube Once   Or   acetaminophen  650 mg Rectal Once   [START ON 06/06/2022] aspirin EC  325 mg Oral Daily   Or   [START ON 06/06/2022] aspirin  324 mg Per Tube Daily   [START ON 06/06/2022] bisacodyl  10 mg Oral Daily   Or   [START ON 06/06/2022] bisacodyl  10 mg Rectal Daily   chlorhexidine  15 mL Mouth/Throat NOW   [START ON 06/06/2022] docusate sodium  200 mg Oral Daily   gentamicin cream  1 Application Topical Daily   metoCLOPramide (REGLAN) injection  10 mg Intravenous Q6H   metoprolol tartrate  12.5 mg Oral BID   Or   metoprolol tartrate  12.5 mg Per Tube BID   [START ON 06/07/2022] pantoprazole  40 mg Oral Daily   simvastatin  20 mg Oral q morning   [START ON 06/06/2022] sodium chloride flush  3 mL Intravenous Q12H    have reviewed scheduled and prn medications.  Physical Exam: General:lying in bed, sedated Heart:normal rate, no edema Lungs: bilateral chest rise, ventilated Abdomen:soft, Non-tender, non-distended Extremities: warm and well perfused Dialysis Access: PD catheter site clean, left upper extremity AV fistula has good thrill and bruit.  Shaune Pollack Kamdon Reisig 06/05/2022,1:04 PM  LOS: 6 days

## 2022-06-05 NOTE — Anesthesia Postprocedure Evaluation (Signed)
Anesthesia Post Note  Patient: [name] [name]  Procedure(s) Performed: OFF PUMP CORONARY ARTERY BYPASS GRAFTING (CABG) X BYPASSES USING LEFT INTERNAL MAMMARY ARTERY AND RIGHT LEG GREATER SAPHENOUS VEIN HARVESTED ENDOSCOPICALLY (Chest) TRANSESOPHAGEAL ECHOCARDIOGRAM (TEE)     Patient location during evaluation: SICU Anesthesia Type: General Level of consciousness: sedated Pain management: pain level controlled Vital Signs Assessment: post-procedure vital signs reviewed and stable Respiratory status: patient remains intubated per anesthesia plan Cardiovascular status: stable Postop Assessment: no apparent nausea or vomiting Anesthetic complications: no   No notable events documented.  Last Vitals:  Vitals:   06/05/22 1736 06/05/22 1751  BP:    Pulse: 80 80  Resp: (!) 21 16  Temp: 36.7 C 36.7 C  SpO2: 98% 99%    Last Pain:  Vitals:   06/05/22 1300  TempSrc: Bladder  PainSc:                  Effie Berkshire

## 2022-06-05 NOTE — Progress Notes (Signed)
PHARMACY NOTE:  ANTIMICROBIAL RENAL DOSAGE ADJUSTMENT  Current antimicrobial regimen includes a mismatch between antimicrobial dosage and estimated renal function.  As per policy approved by the Pharmacy & Therapeutics and Medical Executive Committees, the antimicrobial dosage will be adjusted accordingly.  Current antimicrobial dosage:  Cefazolin 2g IV q8h x6 doses -- One dose given at 1430 PM post op.   Indication: Post-op surgical prophylaxis  Renal Function: ESRD on PD  CrCl cannot be calculated (This lab value cannot be used to calculate CrCl because it is not a number: >18.00). '[]'$      On intermittent HD, scheduled: '[]'$      On CRRT    Antimicrobial dosage has been changed to:  Cefazolin 1g IV q24 x1 day to complete 2 days post-op order  Additional comments:   Thank you for allowing pharmacy to be a part of this patient's care.  Sloan Leiter, PharmD, BCPS, BCCCP Clinical Pharmacist Please refer to [facility] for [place] numbers 06/05/2022 5:23 PM

## 2022-06-05 NOTE — Progress Notes (Signed)
S/p CABG with CT surgery. Appreciate their care.  Will sign off at this time.  Please let us know if any questions or concerns. [facility].

## 2022-06-05 NOTE — Transfer of Care (Signed)
Immediate Anesthesia Transfer of Care Note  Patient: [name] [name]  Procedure(s) Performed: OFF PUMP CORONARY ARTERY BYPASS GRAFTING (CABG) X BYPASSES USING LEFT INTERNAL MAMMARY ARTERY AND RIGHT LEG GREATER SAPHENOUS VEIN HARVESTED ENDOSCOPICALLY (Chest) TRANSESOPHAGEAL ECHOCARDIOGRAM (TEE)  Patient Location: ICU  Anesthesia Type:General  Level of Consciousness: sedated, unresponsive, and Patient remains intubated per anesthesia plan  Airway & Oxygen Therapy: Patient remains intubated per anesthesia plan and Patient placed on Ventilator (see vital sign flow sheet for setting)  Post-op Assessment: Report given to RN and Post -op Vital signs reviewed and stable  Post vital signs: Reviewed and stable  Last Vitals:  Vitals Value Taken Time  BP 127/71 06/05/22 1259  Temp 35 C 06/05/22 1321  Pulse 79 06/05/22 1321  Resp 20 06/05/22 1321  SpO2 96 % 06/05/22 1321  Vitals shown include unvalidated device data.  Last Pain:  Vitals:   06/05/22 0422  TempSrc: Oral  PainSc: 4       Patients Stated Pain Goal: 0 (75/64/33 2951)  Complications: No notable events documented.

## 2022-06-05 NOTE — Anesthesia Procedure Notes (Signed)
Central Venous Catheter Insertion Performed by: Effie Berkshire, MD, anesthesiologist Start/End11/20/2023 6:55 AM, 06/05/2022 7:00 AM Patient location: Pre-op. Preanesthetic checklist: patient identified, IV checked, site marked, risks and benefits discussed, surgical consent, monitors and equipment checked, pre-op evaluation, timeout performed and anesthesia consent Hand hygiene performed  and maximum sterile barriers used  Triple lumen Procedure performed without using ultrasound guided technique. Attempts: 1 Patient tolerated the procedure well with no immediate complications.

## 2022-06-05 NOTE — Anesthesia Preprocedure Evaluation (Addendum)
Anesthesia Evaluation  Patient identified by MRN, date of birth, ID band Patient awake    Reviewed: Allergy & Precautions, NPO status , Patient's Chart, lab work & pertinent test results  Airway Mallampati: III  TM Distance: >3 FB Neck ROM: Full    Dental  (+) Teeth Intact, Dental Advisory Given   Pulmonary asthma , sleep apnea , COPD, former smoker    + decreased breath sounds      Cardiovascular hypertension, + CAD  + dysrhythmias  Rhythm:Regular Rate:Normal  Echo: 1. Left ventricular ejection fraction, by estimation, is 50 to 55%. The  left ventricle has low normal function. The left ventricle demonstrates  regional wall motion abnormalities (see scoring diagram/findings for  description). There is mild left  ventricular hypertrophy. Left ventricular diastolic parameters are  indeterminate.   2. Right ventricular systolic function is normal. The right ventricular  size is normal.   3. The mitral valve is normal in structure. Mild mitral valve  regurgitation. No evidence of mitral stenosis.   4. The aortic valve is calcified. There is mild calcification of the  aortic valve. Aortic valve regurgitation is mild. Aortic valve sclerosis  is present, with no evidence of aortic valve stenosis. Aortic valve mean  gradient measures 9.2 mmHg. Aortic  valve Vmax measures 2.16 m/s.   5. The inferior vena cava is normal in size with greater than 50%  respiratory variability, suggesting right atrial pressure of 3 mmHg.      Neuro/Psych negative neurological ROS  negative psych ROS   GI/Hepatic Neg liver ROS, hiatal hernia, PUD,GERD  ,,  Endo/Other  diabetes    Renal/GU ESRF and DialysisRenal disease     Musculoskeletal   Abdominal   Peds  Hematology   Anesthesia Other Findings   Reproductive/Obstetrics                             Anesthesia Physical Anesthesia Plan  ASA: 4  Anesthesia  Plan: General   Post-op Pain Management:    Induction: Intravenous  PONV Risk Score and Plan: 2 and Ondansetron  Airway Management Planned: Oral ETT  Additional Equipment: ClearSight, CVP, TEE and Ultrasound Guidance Line Placement  Intra-op Plan:   Post-operative Plan: Post-operative intubation/ventilation  Informed Consent: I have reviewed the patients History and Physical, chart, labs and discussed the procedure including the risks, benefits and alternatives for the proposed anesthesia with the patient or authorized representative who has indicated his/her understanding and acceptance.     Dental advisory given  Plan Discussed with: CRNA  Anesthesia Plan Comments:        Anesthesia Quick Evaluation

## 2022-06-05 NOTE — Anesthesia Procedure Notes (Signed)
Central Venous Catheter Insertion Performed by: Effie Berkshire, MD, anesthesiologist Start/End11/20/2023 6:45 AM, 06/05/2022 6:55 AM Patient location: Pre-op. Preanesthetic checklist: patient identified, IV checked, site marked, risks and benefits discussed, surgical consent, monitors and equipment checked, pre-op evaluation, timeout performed and anesthesia consent Position: Trendelenburg Lidocaine 1% used for infiltration and patient sedated Hand hygiene performed , maximum sterile barriers used  and Seldinger technique used Catheter size: 8.5 Fr Total catheter length 10. Central line was placed.Sheath introducer Swan type:thermodilution PA Cath depth:50 Procedure performed using ultrasound guided technique. Ultrasound Notes:anatomy identified, needle tip was noted to be adjacent to the nerve/plexus identified, no ultrasound evidence of intravascular and/or intraneural injection and image(s) printed for medical record Attempts: 1 Following insertion, line sutured, dressing applied and Biopatch. Post procedure assessment: blood return through all ports, free fluid flow and no air  Patient tolerated the procedure well with no immediate complications.

## 2022-06-06 ENCOUNTER — Inpatient Hospital Stay (HOSPITAL_COMMUNITY): Payer: Medicare Other

## 2022-06-06 ENCOUNTER — Encounter (HOSPITAL_COMMUNITY): Payer: Self-pay | Admitting: Thoracic Surgery (Cardiothoracic Vascular Surgery)

## 2022-06-06 DIAGNOSIS — N186 End stage renal disease: Secondary | ICD-10-CM | POA: Diagnosis not present

## 2022-06-06 DIAGNOSIS — Z992 Dependence on renal dialysis: Secondary | ICD-10-CM | POA: Diagnosis not present

## 2022-06-06 DIAGNOSIS — Z951 Presence of aortocoronary bypass graft: Secondary | ICD-10-CM | POA: Diagnosis not present

## 2022-06-06 DIAGNOSIS — I214 Non-ST elevation (NSTEMI) myocardial infarction: Secondary | ICD-10-CM | POA: Diagnosis not present

## 2022-06-06 LAB — POCT I-STAT 7, (LYTES, BLD GAS, ICA,H+H)
Acid-base deficit: 10 mmol/L — ABNORMAL HIGH (ref 0.0–2.0)
Acid-base deficit: 2 mmol/L (ref 0.0–2.0)
Acid-base deficit: 4 mmol/L — ABNORMAL HIGH (ref 0.0–2.0)
Acid-base deficit: 8 mmol/L — ABNORMAL HIGH (ref 0.0–2.0)
Acid-base deficit: 9 mmol/L — ABNORMAL HIGH (ref 0.0–2.0)
Bicarbonate: 16 mmol/L — ABNORMAL LOW (ref 20.0–28.0)
Bicarbonate: 16.1 mmol/L — ABNORMAL LOW (ref 20.0–28.0)
Bicarbonate: 16.5 mmol/L — ABNORMAL LOW (ref 20.0–28.0)
Bicarbonate: 21.4 mmol/L (ref 20.0–28.0)
Bicarbonate: 24.4 mmol/L (ref 20.0–28.0)
Calcium, Ion: 0.98 mmol/L — ABNORMAL LOW (ref 1.15–1.40)
Calcium, Ion: 0.99 mmol/L — ABNORMAL LOW (ref 1.15–1.40)
Calcium, Ion: 1.01 mmol/L — ABNORMAL LOW (ref 1.15–1.40)
Calcium, Ion: 1.08 mmol/L — ABNORMAL LOW (ref 1.15–1.40)
Calcium, Ion: 1.14 mmol/L — ABNORMAL LOW (ref 1.15–1.40)
HCT: 16 % — ABNORMAL LOW (ref 39.0–52.0)
HCT: 18 % — ABNORMAL LOW (ref 39.0–52.0)
HCT: 19 % — ABNORMAL LOW (ref 39.0–52.0)
HCT: 24 % — ABNORMAL LOW (ref 39.0–52.0)
HCT: 26 % — ABNORMAL LOW (ref 39.0–52.0)
Hemoglobin: 5.4 g/dL — CL (ref 13.0–17.0)
Hemoglobin: 6.1 g/dL — CL (ref 13.0–17.0)
Hemoglobin: 6.5 g/dL — CL (ref 13.0–17.0)
Hemoglobin: 8.2 g/dL — ABNORMAL LOW (ref 13.0–17.0)
Hemoglobin: 8.8 g/dL — ABNORMAL LOW (ref 13.0–17.0)
O2 Saturation: 90 %
O2 Saturation: 91 %
O2 Saturation: 94 %
O2 Saturation: 98 %
O2 Saturation: 99 %
Patient temperature: 35
Patient temperature: 36.9
Patient temperature: 37.1
Patient temperature: 37.5
Patient temperature: 37.5
Potassium: 3.9 mmol/L (ref 3.5–5.1)
Potassium: 4.3 mmol/L (ref 3.5–5.1)
Potassium: 4.4 mmol/L (ref 3.5–5.1)
Potassium: 4.6 mmol/L (ref 3.5–5.1)
Potassium: 5.6 mmol/L — ABNORMAL HIGH (ref 3.5–5.1)
Sodium: 136 mmol/L (ref 135–145)
Sodium: 136 mmol/L (ref 135–145)
Sodium: 139 mmol/L (ref 135–145)
Sodium: 139 mmol/L (ref 135–145)
Sodium: 139 mmol/L (ref 135–145)
TCO2: 17 mmol/L — ABNORMAL LOW (ref 22–32)
TCO2: 17 mmol/L — ABNORMAL LOW (ref 22–32)
TCO2: 17 mmol/L — ABNORMAL LOW (ref 22–32)
TCO2: 23 mmol/L (ref 22–32)
TCO2: 26 mmol/L (ref 22–32)
pCO2 arterial: 29.5 mmHg — ABNORMAL LOW (ref 32–48)
pCO2 arterial: 29.9 mmHg — ABNORMAL LOW (ref 32–48)
pCO2 arterial: 33.7 mmHg (ref 32–48)
pCO2 arterial: 40.1 mmHg (ref 32–48)
pCO2 arterial: 46 mmHg (ref 32–48)
pH, Arterial: 7.287 — ABNORMAL LOW (ref 7.35–7.45)
pH, Arterial: 7.323 — ABNORMAL LOW (ref 7.35–7.45)
pH, Arterial: 7.335 — ABNORMAL LOW (ref 7.35–7.45)
pH, Arterial: 7.345 — ABNORMAL LOW (ref 7.35–7.45)
pH, Arterial: 7.353 (ref 7.35–7.45)
pO2, Arterial: 104 mmHg (ref 83–108)
pO2, Arterial: 129 mmHg — ABNORMAL HIGH (ref 83–108)
pO2, Arterial: 62 mmHg — ABNORMAL LOW (ref 83–108)
pO2, Arterial: 69 mmHg — ABNORMAL LOW (ref 83–108)
pO2, Arterial: 70 mmHg — ABNORMAL LOW (ref 83–108)

## 2022-06-06 LAB — GLUCOSE, CAPILLARY
Glucose-Capillary: 109 mg/dL — ABNORMAL HIGH (ref 70–99)
Glucose-Capillary: 110 mg/dL — ABNORMAL HIGH (ref 70–99)
Glucose-Capillary: 110 mg/dL — ABNORMAL HIGH (ref 70–99)
Glucose-Capillary: 122 mg/dL — ABNORMAL HIGH (ref 70–99)
Glucose-Capillary: 124 mg/dL — ABNORMAL HIGH (ref 70–99)
Glucose-Capillary: 124 mg/dL — ABNORMAL HIGH (ref 70–99)
Glucose-Capillary: 126 mg/dL — ABNORMAL HIGH (ref 70–99)
Glucose-Capillary: 132 mg/dL — ABNORMAL HIGH (ref 70–99)
Glucose-Capillary: 133 mg/dL — ABNORMAL HIGH (ref 70–99)
Glucose-Capillary: 134 mg/dL — ABNORMAL HIGH (ref 70–99)
Glucose-Capillary: 145 mg/dL — ABNORMAL HIGH (ref 70–99)
Glucose-Capillary: 155 mg/dL — ABNORMAL HIGH (ref 70–99)
Glucose-Capillary: 158 mg/dL — ABNORMAL HIGH (ref 70–99)
Glucose-Capillary: 163 mg/dL — ABNORMAL HIGH (ref 70–99)
Glucose-Capillary: 164 mg/dL — ABNORMAL HIGH (ref 70–99)
Glucose-Capillary: 178 mg/dL — ABNORMAL HIGH (ref 70–99)
Glucose-Capillary: 96 mg/dL (ref 70–99)

## 2022-06-06 LAB — CBC
HCT: 24.7 % — ABNORMAL LOW (ref 39.0–52.0)
HCT: 24 % — ABNORMAL LOW (ref 39.0–52.0)
HCT: 24.7 % — ABNORMAL LOW (ref 39.0–52.0)
Hemoglobin: 8.1 g/dL — ABNORMAL LOW (ref 13.0–17.0)
Hemoglobin: 7.5 g/dL — ABNORMAL LOW (ref 13.0–17.0)
Hemoglobin: 7.9 g/dL — ABNORMAL LOW (ref 13.0–17.0)
MCH: 30.8 pg (ref 26.0–34.0)
MCH: 30.2 pg (ref 26.0–34.0)
MCH: 30.4 pg (ref 26.0–34.0)
MCHC: 32.8 g/dL (ref 30.0–36.0)
MCHC: 31.3 g/dL (ref 30.0–36.0)
MCHC: 32 g/dL (ref 30.0–36.0)
MCV: 93.9 fL (ref 80.0–100.0)
MCV: 96.8 fL (ref 80.0–100.0)
MCV: 95 fL (ref 80.0–100.0)
Platelets: 104 10*3/uL — ABNORMAL LOW (ref 150–400)
Platelets: 108 10*3/uL — ABNORMAL LOW (ref 150–400)
Platelets: 133 10*3/uL — ABNORMAL LOW (ref 150–400)
RBC: 2.63 MIL/uL — ABNORMAL LOW (ref 4.22–5.81)
RBC: 2.48 MIL/uL — ABNORMAL LOW (ref 4.22–5.81)
RBC: 2.6 MIL/uL — ABNORMAL LOW (ref 4.22–5.81)
RDW: 16.5 % — ABNORMAL HIGH (ref 11.5–15.5)
RDW: 16.5 % — ABNORMAL HIGH (ref 11.5–15.5)
RDW: 16.7 % — ABNORMAL HIGH (ref 11.5–15.5)
WBC: 7.3 10*3/uL (ref 4.0–10.5)
WBC: 8.7 10*3/uL (ref 4.0–10.5)
WBC: 10.4 10*3/uL (ref 4.0–10.5)
nRBC: 0.6 % — ABNORMAL HIGH (ref 0.0–0.2)
nRBC: 0.3 % — ABNORMAL HIGH (ref 0.0–0.2)
nRBC: 0.6 % — ABNORMAL HIGH (ref 0.0–0.2)

## 2022-06-06 LAB — BASIC METABOLIC PANEL
Anion gap: 14 (ref 5–15)
Anion gap: 12 (ref 5–15)
BUN: 49 mg/dL — ABNORMAL HIGH (ref 8–23)
BUN: 47 mg/dL — ABNORMAL HIGH (ref 8–23)
CO2: 21 mmol/L — ABNORMAL LOW (ref 22–32)
CO2: 22 mmol/L (ref 22–32)
Calcium: 8 mg/dL — ABNORMAL LOW (ref 8.9–10.3)
Calcium: 7.6 mg/dL — ABNORMAL LOW (ref 8.9–10.3)
Chloride: 100 mmol/L (ref 98–111)
Chloride: 101 mmol/L (ref 98–111)
Creatinine, Ser: 19.02 mg/dL — ABNORMAL HIGH (ref 0.61–1.24)
Creatinine, Ser: 16.95 mg/dL — ABNORMAL HIGH (ref 0.61–1.24)
GFR, Estimated: 2 mL/min — ABNORMAL LOW (ref >60)
GFR, Estimated: 3 mL/min — ABNORMAL LOW (ref >60)
Glucose, Bld: 132 mg/dL — ABNORMAL HIGH (ref 70–99)
Glucose, Bld: 130 mg/dL — ABNORMAL HIGH (ref 70–99)
Potassium: 6.1 mmol/L — ABNORMAL HIGH (ref 3.5–5.1)
Potassium: 5.2 mmol/L — ABNORMAL HIGH (ref 3.5–5.1)
Sodium: 135 mmol/L (ref 135–145)
Sodium: 135 mmol/L (ref 135–145)

## 2022-06-06 LAB — POCT I-STAT, CHEM 8
BUN: 44 mg/dL — ABNORMAL HIGH (ref 8–23)
BUN: 43 mg/dL — ABNORMAL HIGH (ref 8–23)
Calcium, Ion: 1.08 mmol/L — ABNORMAL LOW (ref 1.15–1.40)
Calcium, Ion: 1.11 mmol/L — ABNORMAL LOW (ref 1.15–1.40)
Chloride: 100 mmol/L (ref 98–111)
Chloride: 103 mmol/L (ref 98–111)
Creatinine, Ser: >18 mg/dL — ABNORMAL HIGH (ref 0.61–1.24)
Creatinine, Ser: >18 mg/dL — ABNORMAL HIGH (ref 0.61–1.24)
Glucose, Bld: 148 mg/dL — ABNORMAL HIGH (ref 70–99)
Glucose, Bld: 106 mg/dL — ABNORMAL HIGH (ref 70–99)
HCT: 26 % — ABNORMAL LOW (ref 39.0–52.0)
HCT: 22 % — ABNORMAL LOW (ref 39.0–52.0)
Hemoglobin: 8.8 g/dL — ABNORMAL LOW (ref 13.0–17.0)
Hemoglobin: 7.5 g/dL — ABNORMAL LOW (ref 13.0–17.0)
Potassium: 4.9 mmol/L (ref 3.5–5.1)
Potassium: 3.9 mmol/L (ref 3.5–5.1)
Sodium: 136 mmol/L (ref 135–145)
Sodium: 139 mmol/L (ref 135–145)
TCO2: 26 mmol/L (ref 22–32)
TCO2: 22 mmol/L (ref 22–32)

## 2022-06-06 LAB — RENAL FUNCTION PANEL
Albumin: 2.7 g/dL — ABNORMAL LOW (ref 3.5–5.0)
Anion gap: 13 (ref 5–15)
BUN: 47 mg/dL — ABNORMAL HIGH (ref 8–23)
CO2: 22 mmol/L (ref 22–32)
Calcium: 7.7 mg/dL — ABNORMAL LOW (ref 8.9–10.3)
Chloride: 101 mmol/L (ref 98–111)
Creatinine, Ser: 16.63 mg/dL — ABNORMAL HIGH (ref 0.61–1.24)
GFR, Estimated: 3 mL/min — ABNORMAL LOW (ref >60)
Glucose, Bld: 129 mg/dL — ABNORMAL HIGH (ref 70–99)
Phosphorus: 4.6 mg/dL (ref 2.5–4.6)
Potassium: 5.2 mmol/L — ABNORMAL HIGH (ref 3.5–5.1)
Sodium: 136 mmol/L (ref 135–145)

## 2022-06-06 LAB — MAGNESIUM
Magnesium: 2 mg/dL (ref 1.7–2.4)
Magnesium: 2 mg/dL (ref 1.7–2.4)

## 2022-06-06 MED ORDER — PRISMASOL BGK 0/2.5 32-2.5 MEQ/L EC SOLN
Status: DC
  Filled 2022-06-06 (×2): qty 5000

## 2022-06-06 MED ORDER — PRISMASOL BGK 0/2.5 32-2.5 MEQ/L EC SOLN
Status: DC
  Filled 2022-06-06 (×12): qty 5000

## 2022-06-06 MED ORDER — ORAL CARE MOUTH RINSE: 15.0000 mL | OROMUCOSAL | Status: DC | PRN

## 2022-06-06 MED ORDER — NOREPINEPHRINE 16 MG/250ML-% IV SOLN
INTRAVENOUS | Status: AC
  Filled 2022-06-06: qty 250

## 2022-06-06 MED ORDER — SODIUM CHLORIDE 0.9 % FOR CRRT: INTRAVENOUS_CENTRAL | Status: DC | PRN

## 2022-06-06 MED ORDER — ENOXAPARIN SODIUM 60 MG/0.6ML IJ SOSY
55.0000 mg | PREFILLED_SYRINGE | Freq: Every day | INTRAMUSCULAR | Status: DC
  Filled 2022-06-06: qty 0.6

## 2022-06-06 MED ORDER — ORAL CARE MOUTH RINSE
15.0000 mL | OROMUCOSAL | Status: DC
  Administered 2022-06-06: 15 mL via OROMUCOSAL

## 2022-06-06 MED ORDER — ENOXAPARIN SODIUM 40 MG/0.4ML IJ SOSY
40.0000 mg | PREFILLED_SYRINGE | Freq: Every day | INTRAMUSCULAR | Status: DC
  Administered 2022-06-06: 40 mg via SUBCUTANEOUS
  Filled 2022-06-06: qty 0.4

## 2022-06-06 MED ORDER — PRISMASOL BGK 0/2.5 32-2.5 MEQ/L EC SOLN
Status: DC
  Filled 2022-06-06 (×3): qty 5000

## 2022-06-06 MED ORDER — AMIODARONE HCL IN DEXTROSE 360-4.14 MG/200ML-% IV SOLN
30.0000 mg/h | INTRAVENOUS | Status: DC
  Administered 2022-06-06 – 2022-06-07 (×3): 30 mg/h via INTRAVENOUS
  Filled 2022-06-06 (×4): qty 200

## 2022-06-06 MED ORDER — HEPARIN SODIUM (PORCINE) 1000 UNIT/ML DIALYSIS
1000.0000 [IU] | INTRAMUSCULAR | Status: DC | PRN
  Administered 2022-06-09: 2800 [IU] via INTRAVENOUS_CENTRAL
  Administered 2022-06-11: 2.8 [IU] via INTRAVENOUS_CENTRAL
  Filled 2022-06-06: qty 3
  Filled 2022-06-06 (×2): qty 6
  Filled 2022-06-06: qty 3

## 2022-06-06 MED ORDER — NOREPINEPHRINE 16 MG/250ML-% IV SOLN
0.0000 ug/min | INTRAVENOUS | Status: DC
  Administered 2022-06-06 – 2022-06-07 (×2): 10 ug/min via INTRAVENOUS
  Administered 2022-06-09: 9 ug/min via INTRAVENOUS
  Administered 2022-06-10: 2 ug/min via INTRAVENOUS
  Filled 2022-06-06 (×3): qty 250

## 2022-06-06 MED ORDER — SODIUM ZIRCONIUM CYCLOSILICATE 10 G PO PACK
10.0000 g | PACK | Freq: Once | ORAL | Status: AC
  Administered 2022-06-06: 10 g via ORAL
  Filled 2022-06-06: qty 1

## 2022-06-06 MED ORDER — ORAL CARE MOUTH RINSE
15.0000 mL | OROMUCOSAL | Status: DC
  Administered 2022-06-06 – 2022-06-08 (×12): 15 mL via OROMUCOSAL

## 2022-06-06 MED ORDER — INSULIN DETEMIR 100 UNIT/ML ~~LOC~~ SOLN
10.0000 [IU] | Freq: Every day | SUBCUTANEOUS | Status: DC
  Administered 2022-06-07 – 2022-06-10 (×4): 10 [IU] via SUBCUTANEOUS
  Filled 2022-06-06 (×4): qty 0.1

## 2022-06-06 MED ORDER — INSULIN ASPART 100 UNIT/ML IJ SOLN
0.0000 [IU] | INTRAMUSCULAR | Status: DC
  Administered 2022-06-07 – 2022-06-08 (×3): 2 [IU] via SUBCUTANEOUS

## 2022-06-06 MED ORDER — INSULIN DETEMIR 100 UNIT/ML ~~LOC~~ SOLN
10.0000 [IU] | Freq: Once | SUBCUTANEOUS | Status: AC
  Administered 2022-06-06: 10 [IU] via SUBCUTANEOUS
  Filled 2022-06-06: qty 0.1

## 2022-06-06 MED FILL — Potassium Chloride Inj 2 mEq/ML: INTRAVENOUS | Qty: 40 | Status: AC

## 2022-06-06 MED FILL — Heparin Sodium (Porcine) Inj 1000 Unit/ML: Qty: 1000 | Status: AC

## 2022-06-06 MED FILL — Lidocaine HCl Local Preservative Free (PF) Inj 2%: INTRAMUSCULAR | Qty: 14 | Status: AC

## 2022-06-06 MED FILL — Cefazolin Sodium-Dextrose IV Solution 2 GM/100ML-4%: INTRAVENOUS | Qty: 100 | Status: AC

## 2022-06-06 NOTE — Addendum Note (Signed)
Addendum  created 06/06/22 1536 by Effie Berkshire, MD   Clinical Note Signed, Intraprocedure Blocks edited

## 2022-06-06 NOTE — Progress Notes (Signed)
PHARMACIST - PHYSICIAN COMMUNICATION  CONCERNING:  Enoxaparin (Lovenox) for DVT Prophylaxis    RECOMMENDATION: Patient was prescribed enoxaprin '40mg'$  q24 hours for VTE prophylaxis.   Filed Weights   06/04/22 0238 06/04/22 0519 06/06/22 0315  Weight: 103.5 kg (228 lb 2.8 oz) 106.8 kg (235 lb 7.2 oz) 113 kg (249 lb 1.9 oz)    Body mass index is 42.76 kg/m.  Estimated Creatinine Clearance: 4 mL/min (A) (by C-G formula based on SCr of 19.02 mg/dL (H)).   Based on [place] patient is candidate for enoxaparin 0.'5mg'$ /kg TBW SQ every 24 hours based on BMI being >30.  DESCRIPTION: Pharmacy has adjusted enoxaparin dose per [facility] policy.  Patient is now receiving enoxaparin 55 mg every 24 hours    Berta Minor, PharmD Clinical Pharmacist  06/06/2022 5:24 PM

## 2022-06-06 NOTE — Progress Notes (Signed)
[facility] NEPHROLOGY PROGRESS NOTE  Assessment/ Plan: Pt is a 71 y.o. yo male   with past medical history significant for OSA, COPD, type II DM, CAD, ESRD on PD, anemia who was presented with chest pain, and NSTEMI, seen as a consultation for the management of ESRD.   Dialysis Orders from previous admission in 01/2022.: [facility] therapies.  CCPD, 5 exchanges 3 liter fill volumes, 10 min fill time, 1 hour 20 minutes dwell, 20 minute drain.  Last fill Icodextran.  EDW 103 kg. Uses combination of 1.5% and 2.5% depending upon BP and weight    #NSTEMI/unstable angina: Cardiac cath on 11/14 with severe in in-stent restenosis in the right coronary artery and proximal LAD disease.  Underwent CABG on 11/20. Surgery following   # ESRD on PD: Continue CCPD at night, 5 exchanges, 2-2.5 L fill volume, mix of 1.5 and 2.5% solution.  He does have left upper extremity AV fistula. Patient was complaining of abdominal distention and difficulty with 2.5 L of fill volume on 11/17.  We lowered fill volume to 2 L and he tolerated well.  Held RRT on 11/20 but now w/ hyperkalemia and likely excess volume. I think it would be best to proceed with CRRT to help manage hyperkalemia and volume status. Will ask CCM to help with temporary HD catheter  #Hyperkalemia: CRRT as above. Lokelma 10g once    # Hypertension/Shock: now with hypotension related surgery/shock. Pressors per primary   # Anemia of ESRD: Hemoglobin around 8-9.  Iron saturation 27 with high ferritin.  Received Aranesp on 11/17. Received transfusion with surgery  #CKD-MBD: Calcium and phosphorus level acceptable.  Currently on sevelamer and calcitriol.  Subjective: Remains on norepi 78mg. Failed extubation. Some abdominal distention.   Objective Vital signs in last 24 hours: Vitals:   06/06/22 0815 06/06/22 0830 06/06/22 0845 06/06/22 0900  BP:    (!) 86/64  Pulse: 84 84 84 84  Resp: 20 (!) '24 15 18  '$ Temp: 99.5 F (37.5  C) 99.5 F (37.5 C) 99.5 F (37.5 C) 99.5 F (37.5 C)  TempSrc:      SpO2: 100% 100% 100% 100%  Weight:      Height:       Weight change:   Intake/Output Summary (Last 24 hours) at 06/06/2022 0907 Last data filed at 06/06/2022 0900 Gross per 24 hour  Intake 5089.9 ml  Output 895 ml  Net 4194.9 ml       Labs: RENAL PANEL Recent Labs    07/24/21 0254 07/25/21 0201 07/29/21 1410 08/14/21 2152 12/14/21 0001 01/18/22 1149 01/18/22 1436 01/20/22 0210 01/21/22 0229 05/30/22 0341 05/30/22 1148 05/30/22 1507 05/31/22 0252 06/01/22 0212 06/02/22 0312 06/03/22 0950911/20/23 0539 06/05/22 0818 06/05/22 0823 06/05/22 1013 06/05/22 1148 06/05/22 1224 06/05/22 1321 06/05/22 1857 06/06/22 0006 06/06/22 0303  NA 130*   < > 134* 130*   < > 135  --  133* 134* 136  --    < > 136 136 134* '137 138 138 137 136 137 139 139 137 136 '$ 135  K 3.3*   < > 4.0 3.4*   < > 3.9  --  3.5 3.7 3.6  --    < > 4.0 3.9 3.9 4.1 4.3 4.8 4.8 4.9 4.1 3.9 4.3 5.3* 5.6* 6.1*  CL 89*   < > 90* 91*   < > 94*  --  95* 95* 96*  --   --  98 96* 94* 98 98 98  --  100  --  103  --  100  --  100  CO2 26   < > 27 23   < > 29  --  '24 27 26  '$ --   --  '27 26 26 25 27  '$ --   --   --   --   --   --  22  --  21*  GLUCOSE 97   < > 124* 104*   < > 116*  --  130* 144* 128*  --   --  101* 121* 132* 127* 143* 128*  --  148*  --  106*  --  148*  --  132*  BUN 73*   < > 67* 50*   < > 40*  --  51* 47* 44*  --   --  48* 50* 47* 50* 52* 49*  --  44*  --  43*  --  48*  --  49*  CREATININE 20.21*   < > 20.22* 18.51*   < > 17.26*   < > 19.15* 18.48* 17.29*  --   --  19.27* 19.46* 19.06* 20.40* 20.83* >18.00*  --  >18.00*  --  >18.00*  --  19.52*  --  19.02*  CALCIUM 9.0   < > 9.4 9.5   < > 9.8  --  9.5 9.3 9.9  --   --  9.4 9.3 9.0 9.2 9.1  --   --   --   --   --   --  7.9*  --  8.0*  MG  --   --  2.4  --   --   --   --   --   --   --  2.1  --   --  2.0 2.0 2.1  --   --   --   --   --   --   --  1.9  --  2.0  PHOS 8.8*  --   --    --   --   --   --   --   --   --   --   --   --  3.3 3.3 4.0 3.6  --   --   --   --   --   --   --   --   --   ALBUMIN 3.0*  2.8*  --   --  2.7*  --  3.2*  --   --   --   --  3.0*  --   --  2.6* 2.7* 2.7* 2.7*  --   --   --   --   --   --   --   --   --    < > = values in this interval not displayed.     Liver Function Tests: Recent Labs  Lab 06/01/22 0212 06/02/22 0312 06/03/22 0648 06/05/22 0539  AST 32 24 20  --   ALT '13 14 14  '$ --   ALKPHOS 46 42 35*  --   BILITOT 0.4 0.4 0.3  --   PROT 5.8* 5.9* 5.9*  --   ALBUMIN 2.6* 2.7* 2.7* 2.7*   No results for input(s): "LIPASE", "AMYLASE" in the last 168 hours. No results for input(s): "AMMONIA" in the last 168 hours. CBC: Recent Labs    06/02/22 0312 06/03/22 0648 06/05/22 1318 06/05/22 1321 06/05/22 1857 06/06/22 0006 06/06/22 0303  HGB 8.5*   < > 8.0* 8.8* 8.0* 8.2* 8.1*  MCV 95.4   < > 95.1  --  93.9  --  93.9  FERRITIN 1,105*  --   --   --   --   --   --   TIBC 293  --   --   --   --   --   --   IRON 79  --   --   --   --   --   --    < > = values in this interval not displayed.    Cardiac Enzymes: No results for input(s): "CKTOTAL", "CKMB", "CKMBINDEX", "TROPONINI" in the last 168 hours. CBG: Recent Labs  Lab 06/06/22 0306 06/06/22 0515 06/06/22 0617 06/06/22 0652 06/06/22 0813  GLUCAP 155* 96 134* 145* 109*    Iron Studies: No results for input(s): "IRON", "TIBC", "TRANSFERRIN", "FERRITIN" in the last 72 hours. Studies/Results: DG Chest Port 1 View  Result Date: 06/06/2022 CLINICAL DATA:  Status post CABG EXAM: PORTABLE CHEST 1 VIEW COMPARISON:  Radiograph 06/05/2022 FINDINGS: Median sternotomy with postsurgical changes of CABG. Right neck catheter tip overlies the mid SVC. Endotracheal tube tip overlies the midthoracic trachea. Nasogastric tube tip overlies the stomach. Stable chest tubes. There is bibasilar airspace disease and a small left pleural effusion, not simply changed from prior. Decreased  interstitial opacities. No evidence of pneumothorax. IMPRESSION: Postoperative chest with decreased pulmonary edema. Persistent bibasilar atelectasis and small left pleural effusion. Electronically Signed   By: Maurine Simmering M.D.   On: 06/06/2022 08:31   ECHO INTRAOPERATIVE TEE  Result Date: 06/05/2022  *INTRAOPERATIVE TRANSESOPHAGEAL REPORT *  Patient Name:   [name] [name] Date of Exam: 06/05/2022 Medical Rec #:  [id number]      Height:       64.0 in Accession #:    [id number]     Weight:       235.4 lb Date of Birth:  09/25/1950     BSA:          2.10 m Patient Age:    73 years       BP:           127/71 mmHg Patient Gender: M              HR:           50 bpm. Exam Location:  Inpatient Transesophogeal exam was perform intraoperatively during surgical procedure. Patient was closely monitored under general anesthesia during the entirety of examination. Indications:     CABG Performing Phys: Suella Broad MD Diagnosing Phys: Suella Broad MD Complications: No known complications during this procedure. POST-OP IMPRESSIONS      s/p CABG _ Left Ventricle: LVEF unchanged, RWA's slightly improved. _ Right Ventricle: The right ventricle appears unchanged from pre-bypass. _ Aorta: The aorta appears unchanged from pre-bypass. _ Left Atrium: The left atrium appears unchanged from pre-bypass. _ Left Atrial Appendage: The left atrial appendage appears unchanged from pre-bypass. _ Aortic Valve: The aortic valve appears unchanged from pre-bypass. _ Mitral Valve: The mitral valve appears unchanged from pre-bypass. _ Tricuspid Valve: The tricuspid valve appears unchanged from pre-bypass. _ Pulmonic Valve: The pulmonic valve appears unchanged from pre-bypass. _ Interatrial Septum: The interatrial septum appears unchanged from pre-bypass. _ Interventricular Septum: The interventricular septum appears unchanged from pre-bypass. _ Pericardium: The pericardium appears unchanged from pre-bypass. PRE-OP FINDINGS  Left Ventricle: The  left ventricle has mildly reduced systolic function, with an ejection fraction of 45-50%. The cavity size was normal. Moderate hypokinesis of the left ventricular,  entire septal wall and inferoseptal wall. There is borderline concentric left ventricular hypertrophy. Left ventricular diastolic function was not evaluated.  LV Wall Scoring: The inferior septum is hypokinetic.  Right Ventricle: The right ventricle has normal systolic function. The cavity was normal. There is no increase in right ventricular wall thickness. There is no aneurysm seen. Left Atrium: Left atrial size was normal in size. No left atrial/left atrial appendage thrombus was detected. The left atrial appendage is well visualized and there is no evidence of thrombus present. Left atrial appendage velocity is normal at greater than 40 cm/s. Right Atrium: Right atrial size was normal in size. Interatrial Septum: No atrial level shunt detected by color flow Doppler. There is no evidence of a patent foramen ovale. Pericardium: There is no evidence of pericardial effusion. There is no pleural effusion. Mitral Valve: The mitral valve is normal in structure. Mitral valve regurgitation is mild by color flow Doppler. The MR jet is centrally-directed. There is no evidence of mitral valve vegetation. There is no evidence of mitral stenosis. Tricuspid Valve: The tricuspid valve was normal in structure. Tricuspid valve regurgitation is mild by color flow Doppler. The jet is directed centrally. No evidence of tricuspid stenosis is present. There is no evidence of tricuspid valve vegetation. Aortic Valve: The aortic valve is tricuspid aortic valve regurgitation is trivial by color flow Doppler. The jet is centrally-directed. There is no stenosis of the aortic valve. Pulmonic Valve: The pulmonic valve was normal in structure No evidence of pumonic stenosis. Pulmonic valve regurgitation is not visualized by color flow Doppler. Aorta: There is evidence of plaque in  the aortic root, ascending aorta, aortic arch and descending aorta; Grade II, measuring 2-30m in size. Pulmonary Artery: The pulmonary artery is of normal size. Shunts: There is no evidence of an atrial septal defect. +--------------+--------++ LEFT VENTRICLE         +--------------+--------++ PLAX 2D                +--------------+--------++ LVIDd:        5.20 cm  +--------------+--------++ LVIDs:        3.90 cm  +--------------+--------++ LVOT diam:    2.00 cm  +--------------+--------++ LV SV:        64 ml    +--------------+--------++ LV SV Index:  28.25    +--------------+--------++ LVOT Area:    3.14 cm +--------------+--------++                        +--------------+--------++ +-------------+------------++ AORTIC VALVE              +-------------+------------++ AV Vmax:     160.00 cm/s  +-------------+------------++ AV Vmean:    101.000 cm/s +-------------+------------++ AV VTI:      0.312 m      +-------------+------------++ AV Peak Grad:10.2 mmHg    +-------------+------------++ AV Mean Grad:5.0 mmHg     +-------------+------------++  +-------------+-------++ AORTA                +-------------+-------++ Ao Root diam:3.00 cm +-------------+-------++ Ao Asc diam: 3.10 cm +-------------+-------++ +--------------+-----------++ MITRAL VALVE               +--------------+-------+ +--------------+-----------++  SHUNTS                MV Area (PHT):0.98 cm     +--------------+-------+ +--------------+-----------++  Systemic Diam:2.00 cm MV Peak grad: 14.1 mmHg    +--------------+-------+ +--------------+-----------++ MV Mean grad: 6.0 mmHg    +--------------+-----------++ MV Vmax:  1.88 m/s    +--------------+-----------++ MV Vmean:     111.0 cm/s  +--------------+-----------++ MV VTI:       0.46 m      +--------------+-----------++ MV PHT:       225.00 msec +--------------+-----------++  Suella Broad MD Electronically signed by Suella Broad MD Signature Date/Time: 06/05/2022/3:19:00 PM    Final    DG Chest Port 1 View  Result Date: 06/05/2022 CLINICAL DATA:  Status post CABG EXAM: PORTABLE CHEST 1 VIEW COMPARISON:  05/30/2022 FINDINGS: Endotracheal tube terminates 2.7 cm above carina. Right internal jugular line terminates at the mid to low SVC. Interval median sternotomy for CABG. Cardiomegaly accentuated by AP portable technique. Right and probable left-sided chest tubes. Possible small left pleural effusion. No pneumothorax. Mild interstitial edema, accentuated by extremely low lung volumes. Mild left base compressive atelectasis. IMPRESSION: Appropriate position of support apparatus, without pneumothorax or other complication. Congestive heart failure with probable small left pleural effusion and adjacent atelectasis. Electronically Signed   By: Abigail Miyamoto M.D.   On: 06/05/2022 13:14    Medications: Infusions:  sodium chloride 20 mL/hr at 06/06/22 0900   sodium chloride     sodium chloride 20 mL/hr at 06/06/22 0900   amiodarone 30 mg/hr (06/06/22 0900)    ceFAZolin (ANCEF) IV     dexmedetomidine (PRECEDEX) IV infusion Stopped (06/06/22 0032)   dialysis solution 1.5% low-MG/low-CA     insulin 0.5 Units/hr (06/06/22 0900)   lactated ringers     lactated ringers     lactated ringers     niCARDipine Stopped (06/06/22 0015)   nitroGLYCERIN Stopped (06/05/22 1601)   norepinephrine (LEVOPHED) Adult infusion Stopped (06/06/22 0818)   phenylephrine (NEO-SYNEPHRINE) Adult infusion Stopped (06/05/22 1322)    Scheduled Medications:  acetaminophen  1,000 mg Oral Q6H   Or   acetaminophen (TYLENOL) oral liquid 160 mg/5 mL  1,000 mg Per Tube Q6H   aspirin EC  325 mg Oral Daily   Or   aspirin  324 mg Per Tube Daily   bisacodyl  10 mg Oral Daily   Or   bisacodyl  10 mg Rectal Daily   Chlorhexidine Gluconate Cloth  6 each Topical Daily   docusate sodium  200 mg Oral Daily    gentamicin cream  1 Application Topical Daily   metoprolol tartrate  12.5 mg Oral BID   Or   metoprolol tartrate  12.5 mg Per Tube BID   [START ON 06/07/2022] pantoprazole  40 mg Oral Daily   simvastatin  20 mg Oral q morning   sodium chloride flush  3 mL Intravenous Q12H    have reviewed scheduled and prn medications.  Physical Exam: General:lying in bed, awake and in nad Heart:normal rate, no edema Lungs: bilateral chest rise, ventilated Abdomen:soft, Non-tender, moderate distention Extremities: warm and well perfused Dialysis Access: PD catheter site clean, left upper extremity AV fistula has good thrill and bruit but small and possibly deep  Shaune Pollack Laylee Schooley 06/06/2022,9:07 AM  LOS: 7 days

## 2022-06-06 NOTE — Progress Notes (Signed)
Failed SICU rapid wean. Pt became hypertensive, desaturated in mid 80's, low MVE. Placed back on full support ventilation.

## 2022-06-06 NOTE — Procedures (Signed)
Central Venous Catheter Insertion Procedure Note  [name] [name]  [id number]  [birth year]  Date:06/06/22  Time:2:08 PM   Provider Performing:Tabor Denham   Procedure: Insertion of Non-tunneled Central Venous Catheter(36556)with US guidance (62952)    Indication(s) Hemodialysis  Consent Risks of the procedure as well as the alternatives and risks of each were explained to the patient and/or caregiver.  Consent for the procedure was obtained and is signed in the bedside chart  Anesthesia Topical only with 1% lidocaine   Timeout Verified patient identification, verified procedure, site/side was marked, verified correct patient position, special equipment/implants available, medications/allergies/relevant history reviewed, required imaging and test results available.  Sterile Technique Maximal sterile technique including full sterile barrier drape, hand hygiene, sterile gown, sterile gloves, mask, hair covering, sterile ultrasound probe cover (if used).  Procedure Description Area of catheter insertion was cleaned with chlorhexidine and draped in sterile fashion.   With real-time ultrasound guidance a HD catheter was placed into the left internal jugular vein.  Nonpulsatile blood flow and easy flushing noted in all ports.  The catheter was sutured in place and sterile dressing applied.  Complications/Tolerance None; patient tolerated the procedure well. Chest X-ray is ordered to verify placement for internal jugular or subclavian cannulation.  Chest x-ray is not ordered for femoral cannulation.  EBL Minimal  Specimen(s) None

## 2022-06-06 NOTE — OR Nursing (Signed)
Date: 06/06/2022   Updated the surgical procedure on this date. Chart reverified.

## 2022-06-06 NOTE — Progress Notes (Signed)
NAME:  [name] [name], MRN:  [id number], DOB:  [birth year], LOS: 61 ADMISSION DATE:  05/30/2022, CONSULTATION DATE: 06/05/2022 REFERRING MD: Dr. Kipp Brood, CHIEF COMPLAINT: S/p CABG  History of Present Illness:  71 year old male with end-stage renal disease on peritoneal dialysis, diabetes type 2, hypertension, hyperlipidemia, coronary artery disease, OSA and COPD, who was admitted with unstable angina, on work-up noted to have multivessel coronary artery disease underwent CABG x2, postop requiring vasopressor support and remain on full mechanical ventilatory support. Received 2 units of PRBCs in OR PCCM was consulted for help evaluation medical management  Pertinent  Medical History   Past Medical History:  Diagnosis Date   Anemia    Arthritis    Asthma    Bell palsy    CAD (coronary artery disease)    a. 2014 MV: abnl w/ infap ischemia; b. 03/2013 Cath: aneurysmal bleb in the LAD w/ otw nonobs dzs-->Med Rx.   Chronic back pain    Chronic knee pain    a. 09/2015 s/p R TKA.   Chronic pain    Chronic shoulder pain    Chronic sinusitis    COPD (chronic obstructive pulmonary disease) ([place])    Diabetes mellitus without complication ([place])    type II    ESRD on peritoneal dialysis ([place])    on peritoneal dialysis, [facility]   Essential hypertension    GERD (gastroesophageal reflux disease)    Gout    Gout    Hepatomegaly    noted on noncontrast CT 2015   History of hiatal hernia    Hyperlipidemia    Lateral meniscus tear    Obesity    Truncal   Obstructive sleep apnea    does not use cpap    On home oxygen therapy    uses 2l when is going somewhere per patient    PUD (peptic ulcer disease)    remote, reports f/u EGD about 8 years ago unremarkable    Reactive airway disease    related to exposure to chemical during 9/11   Sinusitis    Vitamin D deficiency      Significant Hospital Events: Including procedures, antibiotic start and stop dates in addition to other  pertinent events     Interim History / Subjective:  Patient failed weaning from vent per rapid weaning protocol x 2 Currently on a spontaneous breathing trial, will try to extubate him today  Objective   Blood pressure 92/65, pulse 80, temperature 99.5 F (37.5 C), temperature source Bladder, resp. rate 20, height '5\' 4"'$  (1.626 m), weight 113 kg, SpO2 94 %. CVP:  [9 mmHg-31 mmHg] 20 mmHg  Vent Mode: SIMV;PRVC;PSV FiO2 (%):  [40 %-70 %] 60 % Set Rate:  [4 bmp-16 bmp] 4 bmp Vt Set:  [470 mL] 470 mL PEEP:  [5 cmH20-8 cmH20] 5 cmH20 Pressure Support:  [10 cmH20] 10 cmH20 Plateau Pressure:  [14 cmH20-21 cmH20] 19 cmH20   Intake/Output Summary (Last 24 hours) at 06/06/2022 1118 Last data filed at 06/06/2022 1100 Gross per 24 hour  Intake 4891.89 ml  Output 950 ml  Net 3941.89 ml   Filed Weights   06/04/22 0238 06/04/22 0519 06/06/22 0315  Weight: 103.5 kg 106.8 kg 113 kg    Examination:   Physical exam: General: Crtitically ill-appearing male, orally intubated HEENT: /AT, eyes anicteric.  ETT and OGT in place Neuro: Awake, follow commands, moving all 4 extremities Chest: Median sternotomy wound is dry and well dressed, coarse breath sounds, no wheezes or rhonchi.  Chest tube in place Heart: Regular rate and rhythm, no murmurs or gallops Abdomen: Soft, peritoneal dialysis catheter in place, nondistended, bowel sounds present Skin: No rash  Resolved Hospital Problem list     Assessment & Plan:  Coronary artery disease s/p 2-vessel CABG Acute hypoxic respiratory failure due to pulmonary edema Hypertension Hyperlipidemia Diabetes type 2 End-stage renal disease on peritoneal dialysis Hyperkalemia Acute blood loss anemia, perioperative Obesity hypoventilation syndrome/OSA   Continue aspirin and statin Chest tube management TCTS Continue to titrate Precedex with RASS goal 0/-1 Monitor H&H and transfuse per TCTS  Patient failed rapid weaning protocol twice yesterday He  is on a spontaneous breathing trial, will try to extubate him Continue pain control with tramadol, oxycodone and IV morphine Patient is off vasopressors but he has labile blood pressure, intermittently requiring Cardene infusion Continue insulin infusion with CBG goal 140-180, once extubated will switch it to long-acting insulin and sliding scale Spoke with nephrology, patient will likely require CRRT Patient received Lokelma, monitor electrolytes Monitor urine output and labs We will try CPAP postextubation   Best Practice (right click and "Reselect all SmartList Selections" daily)   Diet/type: NPO DVT prophylaxis: SCD GI prophylaxis: H2B Lines: Central line and yes and it is still needed Foley:  Yes, and it is still needed Code Status:  full code Last date of multidisciplinary goals of care discussion [Per primary team]  Labs   CBC: Recent Labs  Lab 06/01/22 0212 06/02/22 0312 06/03/22 0648 06/04/22 0557 06/05/22 0539 06/05/22 0818 06/05/22 1318 06/05/22 1321 06/05/22 1857 06/06/22 0006 06/06/22 0303  WBC 5.6 4.9 5.1 4.2 4.6  --  7.5  --  7.1  --  7.3  NEUTROABS 4.4 3.8 4.0  --   --   --   --   --   --   --   --   HGB 9.0* 8.5* 8.3* 8.3* 8.3*   < > 8.0* 8.8* 8.0* 8.2* 8.1*  HCT 27.8* 27.2* 25.0* 25.8* 26.8*   < > 25.4* 26.0* 24.5* 24.0* 24.7*  MCV 93.3 95.4 94.0 95.2 97.1  --  95.1  --  93.9  --  93.9  PLT 105* 106* 90* 92* 123*  --  105*  --  98*  --  104*   < > = values in this interval not displayed.    Basic Metabolic Panel: Recent Labs  Lab 06/01/22 0212 06/02/22 0312 06/03/22 0648 06/05/22 0539 06/05/22 0818 06/05/22 0823 06/05/22 1013 06/05/22 1148 06/05/22 1224 06/05/22 1321 06/05/22 1857 06/06/22 0006 06/06/22 0303  NA 136 134* 137 138 138   < > 136   < > 139 139 137 136 135  K 3.9 3.9 4.1 4.3 4.8   < > 4.9   < > 3.9 4.3 5.3* 5.6* 6.1*  CL 96* 94* 98 98 98  --  100  --  103  --  100  --  100  CO2 '26 26 25 27  '$ --   --   --   --   --   --  22   --  21*  GLUCOSE 121* 132* 127* 143* 128*  --  148*  --  106*  --  148*  --  132*  BUN 50* 47* 50* 52* 49*  --  44*  --  43*  --  48*  --  49*  CREATININE 19.46* 19.06* 20.40* 20.83* >18.00*  --  >18.00*  --  >18.00*  --  19.52*  --  19.02*  CALCIUM 9.3 9.0 9.2 9.1  --   --   --   --   --   --  7.9*  --  8.0*  MG 2.0 2.0 2.1  --   --   --   --   --   --   --  1.9  --  2.0  PHOS 3.3 3.3 4.0 3.6  --   --   --   --   --   --   --   --   --    < > = values in this interval not displayed.   GFR: Estimated Creatinine Clearance: 4 mL/min (A) (by C-G formula based on SCr of 19.02 mg/dL (H)). Recent Labs  Lab 06/05/22 0539 06/05/22 1318 06/05/22 1857 06/06/22 0303  WBC 4.6 7.5 7.1 7.3    Liver Function Tests: Recent Labs  Lab 05/30/22 1148 06/01/22 0212 06/02/22 0312 06/03/22 0648 06/05/22 0539  AST 14* 32 24 20  --   ALT '13 13 14 14  '$ --   ALKPHOS 68 46 42 35*  --   BILITOT 0.3 0.4 0.4 0.3  --   PROT 6.4* 5.8* 5.9* 5.9*  --   ALBUMIN 3.0* 2.6* 2.7* 2.7* 2.7*   No results for input(s): "LIPASE", "AMYLASE" in the last 168 hours. No results for input(s): "AMMONIA" in the last 168 hours.  ABG    Component Value Date/Time   PHART 7.335 (L) 06/06/2022 0006   PCO2ART 40.1 06/06/2022 0006   PO2ART 62 (L) 06/06/2022 0006   HCO3 21.4 06/06/2022 0006   TCO2 23 06/06/2022 0006   ACIDBASEDEF 4.0 (H) 06/06/2022 0006   O2SAT 90 06/06/2022 0006     Coagulation Profile: Recent Labs  Lab 06/04/22 2118 06/05/22 1318  INR 1.0 1.2    Cardiac Enzymes: No results for input(s): "CKTOTAL", "CKMB", "CKMBINDEX", "TROPONINI" in the last 168 hours.  HbA1C: Hemoglobin A1C  Date/Time Value Ref Range Status  12/17/2021 12:00 AM 6.3  Final  12/19/2016 12:00 AM 6.4  Final   HbA1c, POC (controlled diabetic range)  Date/Time Value Ref Range Status  06/16/2021 10:50 AM 6.2 0.0 - 7.0 % Final  06/16/2020 10:03 AM 6.1 0.0 - 7.0 % Final   Hgb A1c MFr Bld  Date/Time Value Ref Range Status   05/30/2022 11:48 AM 5.9 (H) 4.8 - 5.6 % Final    Comment:    (NOTE) Pre diabetes:          5.7%-6.4%  Diabetes:              >6.4%  Glycemic control for   <7.0% adults with diabetes   07/22/2021 02:16 PM 6.6 (H) 4.8 - 5.6 % Final    Comment:    (NOTE) Pre diabetes:          5.7%-6.4%  Diabetes:              >6.4%  Glycemic control for   <7.0% adults with diabetes     CBG: Recent Labs  Lab 06/06/22 0617 06/06/22 0652 06/06/22 0813 06/06/22 0935 06/06/22 1115  GLUCAP 134* 145* 109* 122* 178*   Critical care time:     Total critical care time: 36 minutes  Performed by: [place] care time was exclusive of separately billable procedures and treating other patients.   Critical care was necessary to treat or prevent imminent or life-threatening deterioration.   Critical care was time spent personally by me on the following activities: development of  treatment plan with patient and/or surrogate as well as nursing, discussions with consultants, evaluation of patient's response to treatment, examination of patient, obtaining history from patient or surrogate, ordering and performing treatments and interventions, ordering and review of laboratory studies, ordering and review of radiographic studies, pulse oximetry and re-evaluation of patient's condition.   Jacky Kindle, MD [facility] See Amion for pager If no response to pager, please call [phone] until 7pm After 7pm, Please call E-link [phone]

## 2022-06-06 NOTE — Procedures (Signed)
Extubation Procedure Note  Patient Details:   Name: [name] [name] DOB: [birth year] MRN: [id number]   Airway Documentation:    Vent end date: 06/06/22 Vent end time: 0945   Evaluation  O2 sats: stable throughout Complications: No apparent complications Patient did tolerate procedure well. Bilateral Breath Sounds: Clear, Diminished   Yes  Patient was extubated to a 4L [name] without any complications, dyspnea or stridor noted. NIF: -30, VC: 886m, positive cuff leak prior to extubation.   CClaretta Fraise11/21/2023, 09:45 AM

## 2022-06-06 NOTE — Progress Notes (Signed)
TCTS Progress Note: POD 0 CAB  Tried to wean x 2, no able to extubate thusfar  Hemodynamics stable  Chest tube output minimal by time of rounds.      Latest Ref Rng & Units 06/06/2022   12:06 AM 06/05/2022    6:57 PM 06/05/2022    1:21 PM  CBC  WBC 4.0 - 10.5 K/uL  7.1    Hemoglobin 13.0 - 17.0 g/dL 8.2  8.0  8.8   Hematocrit 39.0 - 52.0 % 24.0  24.5  26.0   Platelets 150 - 400 K/uL  98         Latest Ref Rng & Units 06/06/2022   12:06 AM 06/05/2022    6:57 PM 06/05/2022    1:21 PM  CMP  Glucose 70 - 99 mg/dL  148    BUN 8 - 23 mg/dL  48    Creatinine 0.61 - 1.24 mg/dL  19.52    Sodium 135 - 145 mmol/L 136  137  139   Potassium 3.5 - 5.1 mmol/L 5.6  5.3  4.3   Chloride 98 - 111 mmol/L  100    CO2 22 - 32 mmol/L  22    Calcium 8.9 - 10.3 mg/dL  7.9      ABG    Component Value Date/Time   PHART 7.335 (L) 06/06/2022 0006   PCO2ART 40.1 06/06/2022 0006   PO2ART 62 (L) 06/06/2022 0006   HCO3 21.4 06/06/2022 0006   TCO2 23 06/06/2022 0006   ACIDBASEDEF 4.0 (H) 06/06/2022 0006   O2SAT 90 06/06/2022 0006    Vent Mode: SIMV;PRVC;PSV FiO2 (%):  [40 %-70 %] 50 % Set Rate:  [4 bmp-16 bmp] 16 bmp Vt Set:  [470 mL] 470 mL PEEP:  [5 cmH20-8 cmH20] 8 cmH20 Pressure Support:  [10 cmH20] 10 cmH20 Plateau Pressure:  [14 cmH20-21 cmH20] 14 cmH20

## 2022-06-06 NOTE — Progress Notes (Signed)
[address]       [place] [address]             [phone]                 1 Day Post-Op Procedure(s) (LRB): OFF PUMP CORONARY ARTERY BYPASS GRAFTING (CABG) X BYPASSES USING LEFT INTERNAL MAMMARY ARTERY AND RIGHT LEG GREATER SAPHENOUS VEIN HARVESTED ENDOSCOPICALLY (N/A) TRANSESOPHAGEAL ECHOCARDIOGRAM (TEE) (N/A)   Events: Failed extubation overnight  _______________________________________________________________ Vitals: BP 104/67   Pulse 84   Temp 99.5 F (37.5 C)   Resp (!) 24   Ht '5\' 4"'$  (1.626 m)   Wt 113 kg   SpO2 100%   BMI 42.76 kg/m  Filed Weights   06/04/22 0238 06/04/22 0519 06/06/22 0315  Weight: 103.5 kg 106.8 kg 113 kg     - Neuro: alert NAD  - Cardiovascular: sinus  Drips: amio 30, Levo 1.   CVP:  [9 mmHg-21 mmHg] 20 mmHg  - Pulm:  Vent Mode: SIMV;PSV;PRVC FiO2 (%):  [40 %-70 %] 50 % Set Rate:  [4 bmp-16 bmp] 16 bmp Vt Set:  [470 mL] 470 mL PEEP:  [5 cmH20-8 cmH20] 8 cmH20 Pressure Support:  [10 cmH20] 10 cmH20 Plateau Pressure:  [14 cmH20-21 cmH20] 19 cmH20  ABG    Component Value Date/Time   PHART 7.335 (L) 06/06/2022 0006   PCO2ART 40.1 06/06/2022 0006   PO2ART 62 (L) 06/06/2022 0006   HCO3 21.4 06/06/2022 0006   TCO2 23 06/06/2022 0006   ACIDBASEDEF 4.0 (H) 06/06/2022 0006   O2SAT 90 06/06/2022 0006    - Abd: ND - Extremity: warm  .Intake/Output      11/20 0701 11/21 0700 11/21 0701 11/22 0700   P.O.     I.V. (mL/kg) 3455 (30.6) 70.2 (0.6)   Blood 845    Other 10 10   IV Piggyback 2405.9    Total Intake(mL/kg) 6715.9 (59.4) 80.2 (0.7)   Urine (mL/kg/hr) 125 (0) 15 (0.1)   Blood 475    Chest Tube 260 10   Total Output 860 25   Net +5855.9 +55.2           _______________________________________________________________ Labs:    Latest Ref Rng & Units 06/06/2022    3:03 AM 06/06/2022   12:06 AM 06/05/2022    6:57 PM  CBC  WBC 4.0 - 10.5 K/uL 7.3   7.1   Hemoglobin 13.0 - 17.0 g/dL 8.1   8.2  8.0   Hematocrit 39.0 - 52.0 % 24.7  24.0  24.5   Platelets 150 - 400 K/uL 104   98       Latest Ref Rng & Units 06/06/2022    3:03 AM 06/06/2022   12:06 AM 06/05/2022    6:57 PM  CMP  Glucose 70 - 99 mg/dL 132   148   BUN 8 - 23 mg/dL 49   48   Creatinine 0.61 - 1.24 mg/dL 19.02   19.52   Sodium 135 - 145 mmol/L 135  136  137   Potassium 3.5 - 5.1 mmol/L 6.1  5.6  5.3   Chloride 98 - 111 mmol/L 100   100   CO2 22 - 32 mmol/L 21   22   Calcium 8.9 - 10.3 mg/dL 8.0   7.9     CXR: Small L effusion  _______________________________________________________________  Assessment and Plan: POD 1 s/p CABG  Neuro: off sedation CV: on A/S/BB.  Will keep  wires for now.  Will add plavix for NSTEMI after wire removal Pulm: wean to extubated Renal: likely will need hemodialysis catheter for vent wean, and hyperK+ GI: NPO for now  Heme: stable ID: [email or web address] Endo: SSI Dispo: continue ICU care   Lajuana Matte 06/06/2022 8:55 AM

## 2022-06-06 NOTE — Progress Notes (Signed)
Failed second attempt at SICU rapid wean. Patient became hypertensive, desaturated to low 80's, low MVE. Secured ETT with tube holder 25@ lips. Placed back on full support.

## 2022-06-07 ENCOUNTER — Inpatient Hospital Stay (HOSPITAL_COMMUNITY): Payer: Medicare Other

## 2022-06-07 DIAGNOSIS — J9601 Acute respiratory failure with hypoxia: Secondary | ICD-10-CM | POA: Diagnosis not present

## 2022-06-07 DIAGNOSIS — I441 Atrioventricular block, second degree: Secondary | ICD-10-CM | POA: Diagnosis not present

## 2022-06-07 DIAGNOSIS — I214 Non-ST elevation (NSTEMI) myocardial infarction: Secondary | ICD-10-CM | POA: Diagnosis not present

## 2022-06-07 DIAGNOSIS — N186 End stage renal disease: Secondary | ICD-10-CM | POA: Diagnosis not present

## 2022-06-07 DIAGNOSIS — I4430 Unspecified atrioventricular block: Secondary | ICD-10-CM | POA: Diagnosis not present

## 2022-06-07 DIAGNOSIS — R579 Shock, unspecified: Secondary | ICD-10-CM

## 2022-06-07 DIAGNOSIS — Z951 Presence of aortocoronary bypass graft: Secondary | ICD-10-CM | POA: Diagnosis not present

## 2022-06-07 LAB — RENAL FUNCTION PANEL
Albumin: 2.7 g/dL — ABNORMAL LOW (ref 3.5–5.0)
Albumin: 2.7 g/dL — ABNORMAL LOW (ref 3.5–5.0)
Anion gap: 15 (ref 5–15)
Anion gap: 13 (ref 5–15)
BUN: 30 mg/dL — ABNORMAL HIGH (ref 8–23)
BUN: 23 mg/dL (ref 8–23)
CO2: 20 mmol/L — ABNORMAL LOW (ref 22–32)
CO2: 23 mmol/L (ref 22–32)
Calcium: 7.8 mg/dL — ABNORMAL LOW (ref 8.9–10.3)
Calcium: 8.3 mg/dL — ABNORMAL LOW (ref 8.9–10.3)
Chloride: 102 mmol/L (ref 98–111)
Chloride: 101 mmol/L (ref 98–111)
Creatinine, Ser: 11.4 mg/dL — ABNORMAL HIGH (ref 0.61–1.24)
Creatinine, Ser: 8.11 mg/dL — ABNORMAL HIGH (ref 0.61–1.24)
GFR, Estimated: 4 mL/min — ABNORMAL LOW (ref >60)
GFR, Estimated: 6 mL/min — ABNORMAL LOW (ref >60)
Glucose, Bld: 134 mg/dL — ABNORMAL HIGH (ref 70–99)
Glucose, Bld: 133 mg/dL — ABNORMAL HIGH (ref 70–99)
Phosphorus: 3.7 mg/dL (ref 2.5–4.6)
Phosphorus: 3.6 mg/dL (ref 2.5–4.6)
Potassium: 4.2 mmol/L (ref 3.5–5.1)
Potassium: 4.5 mmol/L (ref 3.5–5.1)
Sodium: 137 mmol/L (ref 135–145)
Sodium: 137 mmol/L (ref 135–145)

## 2022-06-07 LAB — POCT I-STAT 7, (LYTES, BLD GAS, ICA,H+H)
Acid-Base Excess: 1 mmol/L (ref 0.0–2.0)
Acid-base deficit: 11 mmol/L — ABNORMAL HIGH (ref 0.0–2.0)
Acid-base deficit: 2 mmol/L (ref 0.0–2.0)
Bicarbonate: 13.5 mmol/L — ABNORMAL LOW (ref 20.0–28.0)
Bicarbonate: 23 mmol/L (ref 20.0–28.0)
Bicarbonate: 26.4 mmol/L (ref 20.0–28.0)
Calcium, Ion: 0.9 mmol/L — ABNORMAL LOW (ref 1.15–1.40)
Calcium, Ion: 1.11 mmol/L — ABNORMAL LOW (ref 1.15–1.40)
Calcium, Ion: 1.15 mmol/L (ref 1.15–1.40)
HCT: 15 % — ABNORMAL LOW (ref 39.0–52.0)
HCT: 23 % — ABNORMAL LOW (ref 39.0–52.0)
HCT: 21 % — ABNORMAL LOW (ref 39.0–52.0)
Hemoglobin: 5.1 g/dL — CL (ref 13.0–17.0)
Hemoglobin: 7.8 g/dL — ABNORMAL LOW (ref 13.0–17.0)
Hemoglobin: 7.1 g/dL — ABNORMAL LOW (ref 13.0–17.0)
O2 Saturation: 98 %
O2 Saturation: 98 %
O2 Saturation: 99 %
Patient temperature: 98.2
Patient temperature: 98.2
Patient temperature: 98.7
Potassium: 2.7 mmol/L — CL (ref 3.5–5.1)
Potassium: 4.1 mmol/L (ref 3.5–5.1)
Potassium: 4.4 mmol/L (ref 3.5–5.1)
Sodium: 142 mmol/L (ref 135–145)
Sodium: 134 mmol/L — ABNORMAL LOW (ref 135–145)
Sodium: 136 mmol/L (ref 135–145)
TCO2: 14 mmol/L — ABNORMAL LOW (ref 22–32)
TCO2: 24 mmol/L (ref 22–32)
TCO2: 28 mmol/L (ref 22–32)
pCO2 arterial: 25.2 mmHg — ABNORMAL LOW (ref 32–48)
pCO2 arterial: 39.3 mmHg (ref 32–48)
pCO2 arterial: 45.6 mmHg (ref 32–48)
pH, Arterial: 7.336 — ABNORMAL LOW (ref 7.35–7.45)
pH, Arterial: 7.373 (ref 7.35–7.45)
pH, Arterial: 7.372 (ref 7.35–7.45)
pO2, Arterial: 115 mmHg — ABNORMAL HIGH (ref 83–108)
pO2, Arterial: 115 mmHg — ABNORMAL HIGH (ref 83–108)
pO2, Arterial: 156 mmHg — ABNORMAL HIGH (ref 83–108)

## 2022-06-07 LAB — CBC
HCT: 25 % — ABNORMAL LOW (ref 39.0–52.0)
Hemoglobin: 8.3 g/dL — ABNORMAL LOW (ref 13.0–17.0)
MCH: 31.3 pg (ref 26.0–34.0)
MCHC: 33.2 g/dL (ref 30.0–36.0)
MCV: 94.3 fL (ref 80.0–100.0)
Platelets: 134 10*3/uL — ABNORMAL LOW (ref 150–400)
RBC: 2.65 MIL/uL — ABNORMAL LOW (ref 4.22–5.81)
RDW: 16.5 % — ABNORMAL HIGH (ref 11.5–15.5)
WBC: 9.3 10*3/uL (ref 4.0–10.5)
nRBC: 0.4 % — ABNORMAL HIGH (ref 0.0–0.2)

## 2022-06-07 LAB — GLUCOSE, CAPILLARY
Glucose-Capillary: 107 mg/dL — ABNORMAL HIGH (ref 70–99)
Glucose-Capillary: 138 mg/dL — ABNORMAL HIGH (ref 70–99)
Glucose-Capillary: 127 mg/dL — ABNORMAL HIGH (ref 70–99)
Glucose-Capillary: 113 mg/dL — ABNORMAL HIGH (ref 70–99)
Glucose-Capillary: 112 mg/dL — ABNORMAL HIGH (ref 70–99)
Glucose-Capillary: 118 mg/dL — ABNORMAL HIGH (ref 70–99)

## 2022-06-07 LAB — MAGNESIUM: Magnesium: 2 mg/dL (ref 1.7–2.4)

## 2022-06-07 MED ORDER — PRISMASOL BGK 4/2.5 32-4-2.5 MEQ/L REPLACEMENT SOLN: Status: DC

## 2022-06-07 MED ORDER — CALCIUM CHLORIDE 10 % IV SOLN
INTRAVENOUS | Status: AC
  Filled 2022-06-07: qty 10

## 2022-06-07 MED ORDER — DEXMEDETOMIDINE HCL IN NACL 400 MCG/100ML IV SOLN
0.1000 ug/kg/h | INTRAVENOUS | Status: DC
  Administered 2022-06-07 – 2022-06-08 (×2): 0.2 ug/kg/h via INTRAVENOUS
  Administered 2022-06-09: 0.3 ug/kg/h via INTRAVENOUS
  Filled 2022-06-07 (×2): qty 100

## 2022-06-07 MED ORDER — PRISMASOL BGK 4/2.5 32-4-2.5 MEQ/L EC SOLN: Status: DC

## 2022-06-07 MED ORDER — HEPARIN SODIUM (PORCINE) 5000 UNIT/ML IJ SOLN
5000.0000 [IU] | Freq: Three times a day (TID) | INTRAMUSCULAR | Status: DC
  Administered 2022-06-07 – 2022-06-15 (×24): 5000 [IU] via SUBCUTANEOUS
  Filled 2022-06-07 (×24): qty 1

## 2022-06-07 MED ORDER — ENOXAPARIN SODIUM 30 MG/0.3ML IJ SOSY: 30.0000 mg | PREFILLED_SYRINGE | Freq: Every day | INTRAMUSCULAR | Status: DC

## 2022-06-07 MED ORDER — MIDODRINE HCL 5 MG PO TABS
10.0000 mg | ORAL_TABLET | Freq: Three times a day (TID) | ORAL | Status: DC
  Administered 2022-06-07 – 2022-06-08 (×3): 10 mg via ORAL
  Filled 2022-06-07 (×3): qty 2

## 2022-06-07 MED ORDER — CALCIUM CHLORIDE 10 % IV SOLN
1.0000 g | Freq: Once | INTRAVENOUS | Status: AC
  Administered 2022-06-07: 1 g via INTRAVENOUS

## 2022-06-07 NOTE — Consult Note (Addendum)
Cardiology Consultation   Patient ID: ANKUSH GINTZ MRN: [id number]; DOB: 09-02-1950  Admit date: 05/30/2022 Date of Consult: 06/07/2022  PCP:  Carrolyn Meiers, [place] Prov[facility] Cardiologist:  Pixie Casino, MD   {    Patient Profile:   [name] [name] is a 71 y.o. male with a hx of IDDM, HTN, HLD, CAD (prior DES to RCA Jan 2023 > June 2023 >> July 2023)ESRF on PD, OSA,  who is being seen 06/07/2022 for the evaluation of persistent heart block at the request of Dr. Kipp Brood.  History of Present Illness:   [name] [name] was referred to Dr. Caryl Comes 02/27/22 for heart bock on a monitor., in his review had bradycardia with second-degree AV block type I first-degree AV block and electrocardiograms after which he was submitted for a event recorder demonstrating second-degree AV block 2: 1 and nocturnal complete heart block with no history of syncope. Reasonable heart rate excursion with mean daytime heart rates in the 80s and 90s and adequate maximum rate. Nocturnal CHB suspect 2/2 intermittent noncompliance with CPAP, with no symptoms of bradycardia, not felt indicated for pacing. (Not on BB)  He was admitted 05/30/22 with CP, symptoms c/w unstable angina, again with severe in-stent restenois RCA and proc LAD disease referred to CTS.  08/05/21 > CABG   LIMA LAD, RSVG PDA  Initial attempts to extubated failed though did yesterday AM > 4L  [facility] following for PD > temp dialysis catheter was placed and on CRRT yesterday with plans to transition back to PD  Hypotension requiring pressor  EP is asked to see today for persistent bradycardia post op  Post-op metoprolol ordered (none given) and discontinued Post op started on amiodarone gtt >> d/c'd this AM Initially on Cardene and NTG gtts >>  levophed at 7.5 Neo weaned off   LABS K+ 4.2 > 2.7 (?) > 4.1 BUN/Creat 30/11.40 Ca 7.8 WBC 9.3  Hgb 8.3 > 5.1 (?) > 7.8 Plts 134   Past Medical  History:  Diagnosis Date   Anemia    Arthritis    Asthma    Bell palsy    CAD (coronary artery disease)    a. 2014 MV: abnl w/ infap ischemia; b. 03/2013 Cath: aneurysmal bleb in the LAD w/ otw nonobs dzs-->Med Rx.   Chronic back pain    Chronic knee pain    a. 09/2015 s/p R TKA.   Chronic pain    Chronic shoulder pain    Chronic sinusitis    COPD (chronic obstructive pulmonary disease) ([place])    Diabetes mellitus without complication ([place])    type II    ESRD on peritoneal dialysis ([place])    on peritoneal dialysis, [facility]   Essential hypertension    GERD (gastroesophageal reflux disease)    Gout    Gout    Hepatomegaly    noted on noncontrast CT 2015   History of hiatal hernia    Hyperlipidemia    Lateral meniscus tear    Obesity    Truncal   Obstructive sleep apnea    does not use cpap    On home oxygen therapy    uses 2l when is going somewhere per patient    PUD (peptic ulcer disease)    remote, reports f/u EGD about 8 years ago unremarkable    Reactive airway disease    related to exposure to chemical during 9/11   Sinusitis    Vitamin D  deficiency     Past Surgical History:  Procedure Laterality Date   ASAD LT SHOULDER  12/15/2008   left shoulder   AV FISTULA PLACEMENT Left 08/09/2016   Procedure: BRACHIOCEPHALIC ARTERIOVENOUS (AV) FISTULA CREATION LEFT ARM;  Surgeon: [name], MD;  Location: [place];  Service: Vascular;  Laterality: Left;   CAPD INSERTION N/A 10/07/2018   Procedure: LAPAROSCOPIC PERITONEAL CATHETER PLACEMENT;  Surgeon: Mickeal Skinner, MD;  Location: [facility];  Service: General;  Laterality: N/A;   CATARACT EXTRACTION W/PHACO Left 03/28/2016   Procedure: CATARACT EXTRACTION PHACO AND INTRAOCULAR LENS PLACEMENT LEFT EYE;  Surgeon: [name], MD;  Location: [facility];  Service: Ophthalmology;  Laterality: Left;  CDE: 4.77   CATARACT EXTRACTION W/PHACO Right 04/11/2016   Procedure: CATARACT EXTRACTION PHACO AND INTRAOCULAR LENS  PLACEMENT RIGHT EYE; CDE:  4.74;  Surgeon: [name], MD;  Location: [facility];  Service: Ophthalmology;  Laterality: Right;   COLONOSCOPY  10/15/2008   Fields: Rectal polyp obliterated, not retrieved, hemorrhoids, single ascending colon diverticulum near the CV. Next colonoscopy April 2020   COLONOSCOPY N/A 12/25/2014   SLF: 1. Colorectal polyps (2) removed 2. Small internal hemorrhoids 3. the left colon is severely redundant. hyperplastic polyps   CORONARY ARTERY BYPASS GRAFT N/A 06/05/2022   Procedure: OFF PUMP CORONARY ARTERY BYPASS GRAFTING (CABG) X 2 BYPASSES USING LEFT INTERNAL MAMMARY ARTERY AND RIGHT LEG GREATER SAPHENOUS VEIN HARVESTED ENDOSCOPICALLY;  Surgeon: Lajuana Matte, MD;  Location: [place];  Service: Open Heart Surgery;  Laterality: N/A;   CORONARY STENT INTERVENTION N/A 07/25/2021   Procedure: CORONARY STENT INTERVENTION;  Surgeon: Sherren Mocha, MD;  Location: [place] [facility];  Service: Cardiovascular;  Laterality: N/A;   CORONARY STENT INTERVENTION N/A 12/26/2021   Procedure: CORONARY STENT INTERVENTION;  Surgeon: Nelva Bush, MD;  Location: [place] [facility];  Service: Cardiovascular;  Laterality: N/A;   CORONARY STENT INTERVENTION N/A 01/20/2022   Procedure: CORONARY STENT INTERVENTION;  Surgeon: Sherren Mocha, MD;  Location: [place] [facility];  Service: Cardiovascular;  Laterality: N/A;   DOPPLER ECHOCARDIOGRAPHY     ESOPHAGOGASTRODUODENOSCOPY N/A 12/25/2014   SLF: 1. Anemia most likely due to CRI, gastritis, gastric polyps 2. Moderate non-erosive gastriits and mild duodenitis.  3.TWo large gstric polyps removed.    EYE SURGERY  12/22/2010   tear duct probing-[place]   FOREIGN BODY REMOVAL  03/29/2011   Procedure: REMOVAL FOREIGN BODY EXTREMITY;  Surgeon: Arther Abbott, MD;  [facility];  Service: Orthopedics;  Laterality: Right;  Removal Foreign Body Right Thumb   IR FLUORO GUIDE CV LINE RIGHT  08/06/2018   IR US GUIDE VASC ACCESS RIGHT   08/06/2018   KNEE ARTHROSCOPY  10/16/2007   left   KNEE ARTHROSCOPY WITH LATERAL MENISECTOMY Right 10/14/2015   Procedure: LEFT KNEE ARTHROSCOPY WITH PARTIAL LATERAL MENISECTOMY;  Surgeon: Carole Civil, MD;  [facility];  Service: Orthopedics;  Laterality: Right;   LEFT HEART CATH AND CORONARY ANGIOGRAPHY N/A 07/25/2021   Procedure: LEFT HEART CATH AND CORONARY ANGIOGRAPHY;  Surgeon: Sherren Mocha, MD;  Location: [place] [facility];  Service: Cardiovascular;  Laterality: N/A;   LEFT HEART CATH AND CORONARY ANGIOGRAPHY N/A 12/26/2021   Procedure: LEFT HEART CATH AND CORONARY ANGIOGRAPHY;  Surgeon: Nelva Bush, MD;  Location: [place] [facility];  Service: Cardiovascular;  Laterality: N/A;   LEFT HEART CATH AND CORONARY ANGIOGRAPHY N/A 01/20/2022   Procedure: LEFT HEART CATH AND CORONARY ANGIOGRAPHY;  Surgeon: Sherren Mocha, MD;  Location:  [place] [facility];  Service: Cardiovascular;  Laterality: N/A;   LEFT HEART CATHETERIZATION WITH CORONARY ANGIOGRAM N/A 03/28/2013   Procedure: LEFT HEART CATHETERIZATION WITH CORONARY ANGIOGRAM;  Surgeon: Leonie Man, MD;  Location: [facility];  Service: Cardiovascular;  Laterality: N/A;   NM MYOVIEW LTD     PENILE PROSTHESIS IMPLANT N/A 08/16/2015   Procedure: PENILE PROTHESIS INFLATABLE, three piece, Excisional biopsy of Penile ulcer, Penile molding;  Surgeon: Carolan Clines, MD;  [facility];  Service: Urology;  Laterality: N/A;   PENILE PROSTHESIS IMPLANT N/A 12/24/2017   Procedure: REMOVAL AND  REPLACEMENT  COLOPLAST PENILE PROSTHESIS;  Surgeon: Lucas Mallow, MD;  [facility];  Service: Urology;  Laterality: N/A;   QUADRICEPS TENDON REPAIR  07/21/2011   Procedure: REPAIR QUADRICEP TENDON;  Surgeon: Arther Abbott, MD;  [facility];  Service: Orthopedics;  Laterality: Right;   RIGHT/LEFT HEART CATH AND CORONARY ANGIOGRAPHY N/A 05/30/2022   Procedure: RIGHT/LEFT HEART CATH AND CORONARY ANGIOGRAPHY;  Surgeon:  [name] Booze, MD;  Location: [place] [facility];  Service: Cardiovascular;  Laterality: N/A;   TEE WITHOUT CARDIOVERSION N/A 06/05/2022   Procedure: TRANSESOPHAGEAL ECHOCARDIOGRAM (TEE);  Surgeon: Lajuana Matte, MD;  Location: [place];  Service: Open Heart Surgery;  Laterality: N/A;   TOENAIL EXCISION     removed [id number]   UMBILICAL HERNIA REPAIR  07/17/2005   [place]     Home Medications:  Prior to Admission medications   Medication Sig Start Date End Date Taking? Authorizing Provider  albuterol (VENTOLIN HFA) 108 (90 Base) MCG/ACT inhaler Inhale 2 puffs into the lungs every 6 (six) hours as needed for wheezing or shortness of breath. 03/25/21  Yes Rigoberto Noel, MD  aspirin EC 81 MG tablet Take 81 mg by mouth daily. Swallow whole.   Yes [provider]  calcitRIOL (ROCALTROL) 0.25 MCG capsule Take 0.25 mcg by mouth daily.   Yes [provider]  insulin glargine (LANTUS SOLOSTAR) 100 UNIT/ML Solostar Pen Inject 22 Units into the skin at bedtime. 04/12/22  Yes Brita Romp, NP  isosorbide mononitrate (IMDUR) 60 MG 24 hr tablet TAKE 1 TABLET(60 MG) BY MOUTH DAILY Patient taking differently: Take 60 mg by mouth daily. 05/05/22  Yes Troy Sine, MD  nitroGLYCERIN (NITROSTAT) 0.4 MG SL tablet Place 1 tablet (0.4 mg total) under the tongue every 5 (five) minutes as needed for chest pain. 12/14/21  Yes Maudie Flakes, MD  pantoprazole (PROTONIX) 40 MG tablet Take 1 tablet (40 mg total) by mouth 2 (two) times daily before a meal. 07/25/21  Yes Erenest Rasher, PA-C  sevelamer (RENAGEL) 800 MG tablet Take 2,400 mg by mouth 3 (three) times daily with meals. Take 1 tablet with snacks   Yes [provider]  simvastatin (ZOCOR) 20 MG tablet Take 20 mg by mouth every morning.   Yes [provider]  ticagrelor (BRILINTA) 90 MG TABS tablet TAKE 1 TABLET(90 MG) BY MOUTH TWICE DAILY Patient taking differently: Take 90 mg by mouth 2 (two) times  daily. 04/10/22  Yes Hilty, Nadean Corwin, MD  torsemide (DEMADEX) 100 MG tablet Take 100 mg by mouth daily. 05/03/21  Yes [provider]  TRADJENTA 5 MG TABS tablet TAKE 1 TABLET EVERY DAY Patient taking differently: Take 5 mg by mouth daily. 05/01/22  Yes Brita Romp, NP  DROPLET PEN NEEDLES 31G X 5 MM MISC USE TO INJECT INSULIN DAILY AS DIRECTED 03/17/22   Loni Beckwith  W, MD  [facility] ULTRA test strip USE TO CHECK BLOOD SUGAR TWICE DAILY 03/21/22   Brita Romp, NP  Vitamin D, Ergocalciferol, (DRISDOL) 1.25 MG (50000 UNIT) CAPS capsule Take 50,000 Units by mouth every 7 (seven) days. Monday Patient not taking: Reported on 05/30/2022 05/03/21   [provider]    Inpatient Medications: Scheduled Meds:  acetaminophen  1,000 mg Oral Q6H   Or   acetaminophen (TYLENOL) oral liquid 160 mg/5 mL  1,000 mg Per Tube Q6H   aspirin EC  325 mg Oral Daily   Or   aspirin  324 mg Per Tube Daily   bisacodyl  10 mg Oral Daily   Or   bisacodyl  10 mg Rectal Daily   Chlorhexidine Gluconate Cloth  6 each Topical Daily   docusate sodium  200 mg Oral Daily   enoxaparin (LOVENOX) injection  30 mg Subcutaneous QHS   gentamicin cream  1 Application Topical Daily   insulin aspart  0-24 Units Subcutaneous Q4H   insulin detemir  10 Units Subcutaneous Daily   midodrine  10 mg Oral TID WC   mouth rinse  15 mL Mouth Rinse Q2H   pantoprazole  40 mg Oral Daily   simvastatin  20 mg Oral q morning   sodium chloride flush  3 mL Intravenous Q12H   Continuous Infusions:   prismasol BGK 4/2.5 500 mL/hr at 06/07/22 0654    prismasol BGK 4/2.5 300 mL/hr at 06/07/22 0654   sodium chloride Stopped (06/07/22 0203)   sodium chloride     sodium chloride 10 mL/hr at 06/07/22 1200   dialysis solution 1.5% low-MG/low-CA     lactated ringers     lactated ringers     lactated ringers     norepinephrine (LEVOPHED) Adult infusion 8 mcg/min (06/07/22 1210)   [COMPLETED] norepinephrine      prismasol BGK 4/2.5 2,000 mL/hr at 06/07/22 1206   PRN Meds: sodium chloride, albuterol, heparin, lactated ringers, metoprolol tartrate, morphine injection, [COMPLETED] norepinephrine, ondansetron (ZOFRAN) IV, mouth rinse, oxyCODONE, sodium chloride, sodium chloride flush, traMADol  Allergies:    Allergies  Allergen Reactions   Opana [Oxymorphone Hcl] Itching   Tramadol Itching    Social History:   Social History   Socioeconomic History   Marital status: Married    Spouse name: Not on file   Number of children: 2   Years of education: 12th grade   Highest education level: Not on file  Occupational History   Occupation: disabled   Occupation:      Fish farm manager: UNEMPLOYED  Tobacco Use   Smoking status: Former    Packs/day: 1.00    Years: 25.00    Total pack years: 25.00    Types: Cigarettes    Quit date: 03/27/2010    Years since quitting: 12.2   Smokeless tobacco: Never   Tobacco comments:    Quit x 7 years  Vaping Use   Vaping Use: Never used  Substance and Sexual Activity   Alcohol use: Not Currently    Comment: occasionally   Drug use: Yes    Types: Marijuana    Comment: cocaine- last time used- 11/24/2017 , marijuana-    Sexual activity: Yes  Other Topics Concern   Not on file  Social History Narrative   He quit smoking in 2010. 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 He developed pulmonary problems, became disabled because of lower airway disease in 2009.  WATCHES BASKETBALL. HIS TEAM IS [place].   Social Determinants of Health   Financial Resource Strain: Low Risk  (12/29/2021)   Overall Financial Resource Strain (CARDIA)    Difficulty of Paying Living Expenses: Not hard at all  Food Insecurity: No Food Insecurity (06/07/2022)   Hunger Vital Sign    Worried About Running Out of Food in the Last Year: Never true    Ran Out of Food in the Last Year: Never true  Transportation Needs: No Transportation Needs  (06/07/2022)   PRAPARE - Hydrologist (Medical): No    Lack of Transportation (Non-Medical): No  Physical Activity: Sufficiently Active (12/29/2021)   Exercise Vital Sign    Days of Exercise per Week: 3 days    Minutes of Exercise per Session: 60 min  Stress: No Stress Concern Present (12/29/2021)   [place]    Feeling of Stress : Not at all  Social Connections: [place] (12/29/2021)   Social Connection and Isolation Panel [NHANES]    Frequency of Communication with Friends and Family: More than three times a week    Frequency of Social Gatherings with Friends and Family: More than three times a week    Attends Religious Services: More than 4 times per year    Active Member of [employer] or Organizations: Yes    Attends Music therapist: More than 4 times per year    Marital Status: Married  Human resources officer Violence: Not At Risk (06/07/2022)   Humiliation, Afraid, Rape, and Kick questionnaire    Fear of Current or Ex-Partner: No    Emotionally Abused: No    Physically Abused: No    Sexually Abused: No    Family History:   Family History  Problem Relation Age of Onset   Hypertension Mother        MI   Cancer Mother        breast    Diabetes Mother    Diabetes Father    Hypertension Father    Hypertension Sister    Diabetes Sister    Arthritis Other    Asthma Other    Lung disease Other    Anesthesia problems Neg Hx    Hypotension Neg Hx    Malignant hyperthermia Neg Hx    Pseudochol deficiency Neg Hx    Colon cancer Neg Hx      ROS:  Please see the history of present illness.  All other ROS reviewed and negative.     Physical Exam/Data:   Vitals:   06/07/22 1130 06/07/22 1145 06/07/22 1200 06/07/22 1215  BP:   (!) 116/96   Pulse: 80 80 82 80  Resp: (!) '22 20 18 19  '$ Temp:      TempSrc:      SpO2: 95% 92% 92% 92%  Weight:      Height:         Intake/Output Summary (Last 24 hours) at 06/07/2022 1224 Last data filed at 06/07/2022 1205 Gross per 24 hour  Intake 1878.47 ml  Output 4628 ml  Net -2749.53 ml      06/07/2022    5:00 AM 06/06/2022    3:15 AM 06/04/2022    5:19 AM  Last 3 Weights  Weight (lbs) 240 lb 4.8 oz 249 lb 1.9 oz 235 lb 7.2 oz  Weight (kg) 109 kg 113 kg 106.8 kg     Body mass index is 41.25 kg/m.  General:  Well nourished, well developed, in no acute distress HEENT: normal Neck: no JVD, Left IJ dialysis catheter Vascular: No carotid bruits Cardiac:  RRR; no murmurs, gallops or rubs Lungs: CTA b/l, no wheezing, rhonchi or rales  CT remains Abd: soft, nontender Ext: no edema Musculoskeletal:  No deformities Skin: warm and dry  Neuro:  no focal abnormalities noted Psych:  Normal affect   EKG:  The EKG was personally reviewed and demonstrates:    11//14/23, SR Mobitz one with some 2:1, 48bpm, QRS 139m, LAD Mobitz one 68bpm, QRS 1220m LAD 11/16, 2:1 AVBlock , LBB/LAD 12822m1/18, 2:1 AVBlock , LAD, LBBB 144m82m/20, 2:1 , LBBB, PVCs 11/21 SR, Mobitz one 62bpm, LBBB, LAD 154ms22mlemetry:  Telemetry was personally reviewed and demonstrates:   V pacing at 80 2:1 underneath about 40 with some Mobitz one as well  Relevant CV Studies:  06/05/22: inter-op TEE POST-OP IMPRESSIONS       s/p CABG  _ Left Ventricle: LVEF unchanged, RWA's slightly improved.  _ Right Ventricle: The right ventricle appears unchanged from pre-bypass.  _ Aorta: The aorta appears unchanged from pre-bypass.  _ Left Atrium: The left atrium appears unchanged from pre-bypass.  _ Left Atrial Appendage: The left atrial appendage appears unchanged from  pre-bypass.  _ Aortic Valve: The aortic valve appears unchanged from pre-bypass.  _ Mitral Valve: The mitral valve appears unchanged from pre-bypass.  _ Tricuspid Valve: The tricuspid valve appears unchanged from pre-bypass.  _ Pulmonic Valve: The pulmonic valve  appears unchanged from pre-bypass.  _ Interatrial Septum: The interatrial septum appears unchanged from  pre-bypass.  _ Interventricular Septum: The interventricular septum appears unchanged  from  pre-bypass.  _ Pericardium: The pericardium appears unchanged from pre-bypass.   PRE-OP FINDINGS   Left Ventricle: The left ventricle has mildly reduced systolic function,  with an ejection fraction of 45-50%. The cavity size was normal. Moderate  hypokinesis of the left ventricular, entire septal wall and inferoseptal  wall. There is borderline  concentric left ventricular hypertrophy. Left ventricular diastolic  function was not evaluated.     LV Wall Scoring:  The inferior septum is hypokinetic.     Right Ventricle: The right ventricle has normal systolic function. The  cavity was normal. There is no increase in right ventricular wall  thickness. There is no aneurysm seen.   Left Atrium: Left atrial size was normal in size. No left atrial/left  atrial appendage thrombus was detected. The left atrial appendage is well  visualized and there is no evidence of thrombus present. Left atrial  appendage velocity is normal at greater  than 40 cm/s.   Right Atrium: Right atrial size was normal in size.   Interatrial Septum: No atrial level shunt detected by color flow Doppler.  There is no evidence of a patent foramen ovale.   Pericardium: There is no evidence of pericardial effusion. There is no  pleural effusion.   Mitral Valve: The mitral valve is normal in structure. Mitral valve  regurgitation is mild by color flow Doppler. The MR jet is  centrally-directed. There is no evidence of mitral valve vegetation. There  is no evidence of mitral stenosis.   Tricuspid Valve: The tricuspid valve was normal in structure. Tricuspid  valve regurgitation is mild by color flow Doppler. The jet is directed  centrally. No evidence of tricuspid stenosis is present. There is no  evidence of  tricuspid valve vegetation.   Aortic Valve: The aortic valve is tricuspid aortic  valve regurgitation is  trivial by color flow Doppler. The jet is centrally-directed. There is no  stenosis of the aortic valve.   Pulmonic Valve: The pulmonic valve was normal in structure No evidence of  pumonic stenosis.  Pulmonic valve regurgitation is not visualized by color flow Doppler.    Aorta: There is evidence of plaque in the aortic root, ascending aorta,  aortic arch and descending aorta; Grade II, measuring 2-51m in size.   Pulmonary Artery: The pulmonary artery is of normal size.   Shunts: There is no evidence of an atrial septal defect.    05/30/22: LHC  2nd Diag lesion is 30% stenosed.   RV Branch lesion is 90% stenosed.   RPDA lesion is 80% stenosed.   Mid RCA-1 lesion is 90% stenosed.   Mid RCA-2 lesion is 90% stenosed.   Dist RCA lesion is 75% stenosed.   Prox LAD lesion is 75% stenosed.   Mid Cx lesion is 50% stenosed.   Non-stenotic Prox RCA lesion was previously treated.   LV end diastolic pressure is mildly elevated.   There is no aortic valve stenosis.   Aortic saturation 97%, PA saturation 65%, PA pressure 35/16, mean PA pressure 24 mmHg, mean pulmonary capillary wedge pressure 17 mmHg, right atrial pressure 4/7 mmHg, cardiac output 6.94 L/min, cardiac index 3.43.   Severe in-stent restenosis in the mid RCA.  Denovo distal RCA lesion.  Back in July 2023, there was a moderate lesion but this has increased significantly. Proximal LAD lesion worsened as well compared to July 2023.  There is now an irregular 75% stenosis. Essentially normal right heart pressures.   Given multiple episodes of restenosis, repeat PCI [name] [name] not likely given the long-term benefit.  Now with proximal LAD lesion which has changed in the last few months, [name] [name] get cardiac surgery consult.  [name] [name] hold antiplatelet therapy in the event that he is a candidate for surgery.  May need to start IV cangrelor for  antiplatelet therapy.  05/31/22: TTE 1. Left ventricular ejection fraction, by estimation, is 50 to 55%. The  left ventricle has low normal function. The left ventricle demonstrates  regional wall motion abnormalities (see scoring diagram/findings for  description). There is mild left  ventricular hypertrophy. Left ventricular diastolic parameters are  indeterminate.   2. Right ventricular systolic function is normal. The right ventricular  size is normal.   3. The mitral valve is normal in structure. Mild mitral valve  regurgitation. No evidence of mitral stenosis.   4. The aortic valve is calcified. There is mild calcification of the  aortic valve. Aortic valve regurgitation is mild. Aortic valve sclerosis  is present, with no evidence of aortic valve stenosis. Aortic valve mean  gradient measures 9.2 mmHg. Aortic  valve Vmax measures 2.16 m/s.   5. The inferior vena cava is normal in size with greater than 50%  respiratory variability, suggesting right atrial pressure of 3 mmHg.     Laboratory Data:  High Sensitivity Troponin:   Recent Labs  Lab 05/30/22 0341 05/30/22 0640 05/30/22 1148  TROPONINIHS 100* 142* 330*     Chemistry Recent Labs  Lab 06/06/22 0303 06/06/22 0938 06/06/22 1651 06/07/22 0344 06/07/22 0612 06/07/22 0630  NA 135   < > 136  135 137 142 134*  K 6.1*   < > 5.2*  5.2* 4.2 2.7* 4.1  CL 100  --  101  101 102  --   --   CO2 21*  --  22  22 20*  --   --   GLUCOSE 132*  --  129*  130* 134*  --   --   BUN 49*  --  47*  47* 30*  --   --   CREATININE 19.02*  --  16.63*  16.95* 11.40*  --   --   CALCIUM 8.0*  --  7.7*  7.6* 7.8*  --   --   MG 2.0  --  2.0 2.0  --   --   GFRNONAA 2*  --  3*  3* 4*  --   --   ANIONGAP 14  --  '13  12 15  '$ --   --    < > = values in this interval not displayed.    Recent Labs  Lab 06/01/22 0212 06/02/22 0312 06/03/22 0648 06/05/22 0539 06/06/22 1651 06/07/22 0344  PROT 5.8* 5.9* 5.9*  --   --   --    ALBUMIN 2.6* 2.7* 2.7* 2.7* 2.7* 2.7*  AST 32 24 20  --   --   --   ALT '13 14 14  '$ --   --   --   ALKPHOS 46 42 35*  --   --   --   BILITOT 0.4 0.4 0.3  --   --   --    Lipids  Recent Labs  Lab 06/03/22 0648  CHOL 88  TRIG 98  HDL 32*  LDLCALC 36  CHOLHDL 2.8    Hematology Recent Labs  Lab 06/06/22 1235 06/06/22 1346 06/06/22 1651 06/07/22 0344 06/07/22 0612 06/07/22 0630  WBC 8.7  --  10.4 9.3  --   --   RBC 2.48*  --  2.60* 2.65*  --   --   HGB 7.5*   < > 7.9* 8.3* 5.1* 7.8*  HCT 24.0*   < > 24.7* 25.0* 15.0* 23.0*  MCV 96.8  --  95.0 94.3  --   --   MCH 30.2  --  30.4 31.3  --   --   MCHC 31.3  --  32.0 33.2  --   --   RDW 16.5*  --  16.7* 16.5*  --   --   PLT 108*  --  133* 134*  --   --    < > = values in this interval not displayed.   Thyroid No results for input(s): "TSH", "FREET4" in the last 168 hours.  BNPNo results for input(s): "BNP", "PROBNP" in the last 168 hours.  DDimer No results for input(s): "DDIMER" in the last 168 hours.   Radiology/Studies:  DG Chest Port 1 View Result Date: 06/07/2022 CLINICAL DATA:  Chest tube present status post CABG. EXAM: PORTABLE CHEST 1 VIEW COMPARISON:  Chest radiograph 1 day prior FINDINGS: Bilateral IJ central venous catheters are in place terminating in the mid SVC. Bilateral chest tubes are stable. Median sternotomy wires are stable. The heart is enlarged, unchanged. The upper mediastinal contours are stable. Left worse than right basilar airspace disease and a probable small left pleural effusion are unchanged. There is no new or worsening focal airspace disease. There is no appreciable pneumothorax. The bones are stable. IMPRESSION: 1. Unchanged left worse than right basilar airspace opacity and probable small left pleural effusion. No new or worsening focal airspace disease. 2. Support devices as above. Electronically Signed   By: Valetta Mole M.D.   On: 06/07/2022 08:30     Assessment and Plan:   Mobitz one with  2:1 and  known nocturnal CHB at his baseline He is now s/p CABG POD # 2 QRS looks wider now then before I suspect PPM prior to discharge  He remains on some levophed and is getting CRRT (Via Left IJ), chest tube remains  EP [name] [name] follow along Avoid nodal blocking agents  Continue otherwise with CTS, attending cards teams   Risk Assessment/Risk Scores:    For questions or updates, please contact [place] Please consult [email or web address] for contact info under    Signed, Baldwin Jamaica, PA-C  06/07/2022 12:24 PM  I have seen and examined this patient with [name] [name].  Agree with above, note added to reflect my findings.  Patient is postop day 2 from CABG x2.  He is having episodes of Mobitz 1, 2-1, nocturnal complete heart block.  He is worn a cardiac monitor with node conduction system disease.  He currently feels well.  He is on dialysis.  He is getting CRRT.  He has plans of switching back to his peritoneal dialysis.  He also has IV access in his both right and left neck as well as a chest tube.  GEN: Well nourished, well developed, in no acute distress  HEENT: normal  Neck: no JVD, carotid bruits, or masses Cardiac: RRR; no murmurs, rubs, or gallops,no edema  Respiratory:  clear to auscultation bilaterally, normal work of breathing GI: soft, nontender, nondistended, + BS MS: no deformity or atrophy  Skin: warm and dry Neuro:  Strength and sensation are intact Psych: euthymic mood, full affect   Second-degree AV block: Patient has known conduction system disease and has had evidence in the past of second-degree AV block.  His QRS is somewhat wider.  He certainly may need pacemaker implant prior to discharge.  For now, EP service Mackson Botz continue to monitor.  He would need to go back on his peritoneal dialysis and have both central access points in his neck removed to avoid infection risk.  Would also prefer if we could wait until after he gets chest tubes removed.  We  Jostin Rue continue to follow and once tubes are removed we Jora Galluzzo reassess conduction system.  Neko Mcgeehan M. Tanay Massiah MD 06/07/2022 5:12 PM

## 2022-06-07 NOTE — Progress Notes (Signed)
NAME:  [name] [name], MRN:  [id number], DOB:  29-May-1951, LOS: 15 ADMISSION DATE:  05/30/2022, CONSULTATION DATE: 06/05/2022 REFERRING MD: Dr. Kipp Brood, CHIEF COMPLAINT: S/p CABG  History of Present Illness:  71 year old male with end-stage renal disease on peritoneal dialysis, diabetes type 2, hypertension, hyperlipidemia, coronary artery disease, OSA and COPD, who was admitted with unstable angina, on work-up noted to have multivessel coronary artery disease underwent CABG x2, postop requiring vasopressor support and remain on full mechanical ventilatory support. Received 2 units of PRBCs in OR PCCM was consulted for help evaluation medical management  Pertinent  Medical History   Past Medical History:  Diagnosis Date   Anemia    Arthritis    Asthma    Bell palsy    CAD (coronary artery disease)    a. 2014 MV: abnl w/ infap ischemia; b. 03/2013 Cath: aneurysmal bleb in the LAD w/ otw nonobs dzs-->Med Rx.   Chronic back pain    Chronic knee pain    a. 09/2015 s/p R TKA.   Chronic pain    Chronic shoulder pain    Chronic sinusitis    COPD (chronic obstructive pulmonary disease) ([place])    Diabetes mellitus without complication ([place])    type II    ESRD on peritoneal dialysis ([place])    on peritoneal dialysis, [facility]   Essential hypertension    GERD (gastroesophageal reflux disease)    Gout    Gout    Hepatomegaly    noted on noncontrast CT 2015   History of hiatal hernia    Hyperlipidemia    Lateral meniscus tear    Obesity    Truncal   Obstructive sleep apnea    does not use cpap    On home oxygen therapy    uses 2l when is going somewhere per patient    PUD (peptic ulcer disease)    remote, reports f/u EGD about 8 years ago unremarkable    Reactive airway disease    related to exposure to chemical during 9/11   Sinusitis    Vitamin D deficiency      Significant Hospital Events: Including procedures, antibiotic start and stop dates in addition to other  pertinent events     Interim History / Subjective:  Patient remained on BiPAP since yesterday, it was titrated off this morning currently on 4 L nasal cannula oxygen Started on CRRT, tolerating it well so far Still requiring vasopressor support with Levophed   Objective   Blood pressure 100/60, pulse 80, temperature 98.7 F (37.1 C), temperature source Oral, resp. rate 20, height '5\' 4"'$  (1.626 m), weight 109 kg, SpO2 92 %. CVP:  [11 mmHg-20 mmHg] 16 mmHg  Vent Mode: BIPAP FiO2 (%):  [40 %] 40 % Set Rate:  [15 bmp] 15 bmp PEEP:  [8 cmH20] 8 cmH20 Pressure Support:  [8 cmH20] 8 cmH20   Intake/Output Summary (Last 24 hours) at 06/07/2022 1205 Last data filed at 06/07/2022 1140 Gross per 24 hour  Intake 1830.2 ml  Output 4401 ml  Net -2570.8 ml   Filed Weights   06/04/22 0519 06/06/22 0315 06/07/22 0500  Weight: 106.8 kg 113 kg 109 kg    Examination: Physical exam: General: Acute on chronically ill-appearing morbidly obese male, lying on the bed HEENT: [place]/AT, eyes anicteric.  moist mucus membranes Neuro: Alert, awake following commands Chest: Coarse breath sounds, no wheezes or rhonchi Heart: Regular rate and rhythm, no murmurs or gallops Abdomen: Soft, nontender, nondistended, bowel sounds present.  Peritoneal dialysis catheter in place Skin: No rash  Chest tube output was 140 cc in last 24 hours  Resolved Hospital Problem list     Assessment & Plan:  Coronary artery disease s/p 2-vessel CABG Acute hypoxic respiratory failure due to pulmonary edema, currently on nasal cannula oxygen Hypertension Hyperlipidemia Diabetes type 2 End-stage renal disease on peritoneal dialysis.  Currently on CRRT Hyperkalemia Acute blood loss anemia, perioperative Obesity hypoventilation syndrome/OSA Morbid obesity   Continue aspirin and statin Chest tube management TCTS Continue to titrate Precedex with RASS goal 0/-1 Monitor H&H and transfuse per TCTS  Received 2 units PRBCs  yesterday as hemoglobin dropped to 5.1, current hemoglobin 7.8 Patient was successfully extubated yesterday, remains on BiPAP overnight, currently on 4 L nasal cannula oxygen, tolerating it well so far Continue pain control with tramadol, oxycodone and IV morphine Patient is requiring IV vasopressor support with Levophed, titrate with map goal 65 Started on midodrine Insulin infusion was transitioned to sliding scale and long-acting insulin Continue CRRT for now, transition back to PD once stable and off pressors Patient received Lokelma, monitor electrolytes   Best Practice (right click and "Reselect all SmartList Selections" daily)   Diet/type: Regular consistency DVT prophylaxis: Subcu Lovenox GI prophylaxis: Protonix Lines: Central line and yes and it is still needed Foley: Discontinue Code Status:  full code Last date of multidisciplinary goals of care discussion [Per primary team]  Labs   CBC: Recent Labs  Lab 06/01/22 0212 06/02/22 0312 06/03/22 0648 06/04/22 0557 06/05/22 1857 06/06/22 0006 06/06/22 0303 06/06/22 0938 06/06/22 1235 06/06/22 1346 06/06/22 1651 06/07/22 0344 06/07/22 0612 06/07/22 0630  WBC 5.6 4.9 5.1   < > 7.1  --  7.3  --  8.7  --  10.4 9.3  --   --   NEUTROABS 4.4 3.8 4.0  --   --   --   --   --   --   --   --   --   --   --   HGB 9.0* 8.5* 8.3*   < > 8.0*   < > 8.1*   < > 7.5* 5.4* 7.9* 8.3* 5.1* 7.8*  HCT 27.8* 27.2* 25.0*   < > 24.5*   < > 24.7*   < > 24.0* 16.0* 24.7* 25.0* 15.0* 23.0*  MCV 93.3 95.4 94.0   < > 93.9  --  93.9  --  96.8  --  95.0 94.3  --   --   PLT 105* 106* 90*   < > 98*  --  104*  --  108*  --  133* 134*  --   --    < > = values in this interval not displayed.    Basic Metabolic Panel: Recent Labs  Lab 06/02/22 0312 06/03/22 0648 06/05/22 0539 06/05/22 0818 06/05/22 1224 06/05/22 1321 06/05/22 1857 06/06/22 0006 06/06/22 0303 06/06/22 0938 06/06/22 1346 06/06/22 1651 06/07/22 0344 06/07/22 0612  06/07/22 0630  NA 134* 137 138   < > 139   < > 137   < > 135   < > 139 136  135 137 142 134*  K 3.9 4.1 4.3   < > 3.9   < > 5.3*   < > 6.1*   < > 4.4 5.2*  5.2* 4.2 2.7* 4.1  CL 94* 98 98   < > 103  --  100  --  100  --   --  101  101 102  --   --  CO2 '26 25 27  '$ --   --   --  22  --  21*  --   --  22  22 20*  --   --   GLUCOSE 132* 127* 143*   < > 106*  --  148*  --  132*  --   --  129*  130* 134*  --   --   BUN 47* 50* 52*   < > 43*  --  48*  --  49*  --   --  47*  47* 30*  --   --   CREATININE 19.06* 20.40* 20.83*   < > >18.00*  --  19.52*  --  19.02*  --   --  16.63*  16.95* 11.40*  --   --   CALCIUM 9.0 9.2 9.1  --   --   --  7.9*  --  8.0*  --   --  7.7*  7.6* 7.8*  --   --   MG 2.0 2.1  --   --   --   --  1.9  --  2.0  --   --  2.0 2.0  --   --   PHOS 3.3 4.0 3.6  --   --   --   --   --   --   --   --  4.6 3.7  --   --    < > = values in this interval not displayed.   GFR: Estimated Creatinine Clearance: 6.6 mL/min (A) (by C-G formula based on SCr of 11.4 mg/dL (H)). Recent Labs  Lab 06/06/22 0303 06/06/22 1235 06/06/22 1651 06/07/22 0344  WBC 7.3 8.7 10.4 9.3    Liver Function Tests: Recent Labs  Lab 06/01/22 0212 06/02/22 0312 06/03/22 0648 06/05/22 0539 06/06/22 1651 06/07/22 0344  AST 32 24 20  --   --   --   ALT '13 14 14  '$ --   --   --   ALKPHOS 46 42 35*  --   --   --   BILITOT 0.4 0.4 0.3  --   --   --   PROT 5.8* 5.9* 5.9*  --   --   --   ALBUMIN 2.6* 2.7* 2.7* 2.7* 2.7* 2.7*   No results for input(s): "LIPASE", "AMYLASE" in the last 168 hours. No results for input(s): "AMMONIA" in the last 168 hours.  ABG    Component Value Date/Time   PHART 7.373 06/07/2022 0630   PCO2ART 39.3 06/07/2022 0630   PO2ART 115 (H) 06/07/2022 0630   HCO3 23.0 06/07/2022 0630   TCO2 24 06/07/2022 0630   ACIDBASEDEF 2.0 06/07/2022 0630   O2SAT 98 06/07/2022 0630     Coagulation Profile: Recent Labs  Lab 06/04/22 2118 06/05/22 1318  INR 1.0 1.2    Cardiac  Enzymes: No results for input(s): "CKTOTAL", "CKMB", "CKMBINDEX", "TROPONINI" in the last 168 hours.  HbA1C: Hemoglobin A1C  Date/Time Value Ref Range Status  12/17/2021 12:00 AM 6.3  Final  12/19/2016 12:00 AM 6.4  Final   HbA1c, POC (controlled diabetic range)  Date/Time Value Ref Range Status  06/16/2021 10:50 AM 6.2 0.0 - 7.0 % Final  06/16/2020 10:03 AM 6.1 0.0 - 7.0 % Final   Hgb A1c MFr Bld  Date/Time Value Ref Range Status  05/30/2022 11:48 AM 5.9 (H) 4.8 - 5.6 % Final    Comment:    (NOTE) Pre diabetes:  5.7%-6.4%  Diabetes:              >6.4%  Glycemic control for   <7.0% adults with diabetes   07/22/2021 02:16 PM 6.6 (H) 4.8 - 5.6 % Final    Comment:    (NOTE) Pre diabetes:          5.7%-6.4%  Diabetes:              >6.4%  Glycemic control for   <7.0% adults with diabetes     CBG: Recent Labs  Lab 06/06/22 1949 06/06/22 2322 06/07/22 0342 06/07/22 0750 06/07/22 1116  GLUCAP 110* 110* 107* 138* 127*   Critical care time:     Total critical care time: 33 minutes  Performed by: [place] care time was exclusive of separately billable procedures and treating other patients.   Critical care was necessary to treat or prevent imminent or life-threatening deterioration.   Critical care was time spent personally by me on the following activities: development of treatment plan with patient and/or surrogate as well as nursing, discussions with consultants, evaluation of patient's response to treatment, examination of patient, obtaining history from patient or surrogate, ordering and performing treatments and interventions, ordering and review of laboratory studies, ordering and review of radiographic studies, pulse oximetry and re-evaluation of patient's condition.   Jacky Kindle, MD [facility] See Amion for pager If no response to pager, please call [phone] until 7pm After 7pm, Please call E-link  [phone]

## 2022-06-07 NOTE — Progress Notes (Signed)
[name] NEPHROLOGY PROGRESS NOTE  Assessment/ Plan: Pt is a 70 y.o. yo male   with past medical history significant for OSA, COPD, type II DM, CAD, ESRD on PD, anemia who was presented with chest pain, and NSTEMI, seen as a consultation for the management of ESRD.   Dialysis Orders from previous admission in 01/2022.: [facility] therapies.  CCPD, 5 exchanges 3 liter fill volumes, 10 min fill time, 1 hour 20 minutes dwell, 20 minute drain.  Last fill Icodextran.  EDW 103 kg. Uses combination of 1.5% and 2.5% depending upon BP and weight    #NSTEMI/unstable angina: Cardiac cath on 11/14 with severe in in-stent restenosis in the right coronary artery and proximal LAD disease.  Underwent CABG on 11/20. Surgery following   # ESRD on PD: Continue CCPD at night, 5 exchanges, 2-2.5 L fill volume, mix of 1.5 and 2.5% solution.  He does have left upper extremity AV fistula. Patient was complaining of abdominal distention and difficulty with 2.5 L of fill volume on 11/17.  We lowered fill volume to 2 L and he tolerated well.  Held RRT on 11/20 but then started CRRT on 11/21.  Temporary dialysis catheter placed by CCM.  Appreciate help.  Continue CRRT for now.  Transition back to PD as tolerated  #Hyperkalemia: Improved with CRRT    # Hypertension/Shock: now with hypotension related surgery/shock. Pressors per primary   # Anemia of ESRD: Hemoglobin around 8-9.  Iron saturation 27 with high ferritin.  Received Aranesp on 11/17. Received transfusion with surgery  #CKD-MBD: Calcium and phosphorus level acceptable.  Currently on sevelamer and calcitriol.  Subjective: Cards on phenylephrine yesterday and overnight.  Now on norepinephrine only.  Started CRRT yesterday afternoon.  Patient now extubated and feels fairly well.  Objective Vital signs in last 24 hours: Vitals:   06/07/22 0800 06/07/22 0815 06/07/22 0830 06/07/22 0845  BP: 116/67     Pulse: 80 80 80 80  Resp: (!) 22  (!) 21 (!) 22 17  Temp:      TempSrc:      SpO2: 96% 95% 98% 95%  Weight:      Height:       Weight change: -4 kg  Intake/Output Summary (Last 24 hours) at 06/07/2022 0854 Last data filed at 06/07/2022 0830 Gross per 24 hour  Intake 2134 ml  Output 3787 ml  Net -1653 ml       Labs: RENAL PANEL Recent Labs    07/24/21 0254 07/25/21 0201 07/29/21 1410 08/14/21 2152 12/14/21 0001 01/18/22 1149 01/18/22 1436 05/30/22 0341 05/30/22 1148 05/30/22 1507 05/31/22 0252 06/01/22 0212 06/02/22 0312 06/03/22 4765 06/05/22 0539 06/05/22 0818 06/05/22 0823 06/05/22 1013 06/05/22 1148 06/05/22 1224 06/05/22 1321 06/05/22 1857 06/06/22 0006 06/06/22 0303 06/06/22 0938 06/06/22 1116 06/06/22 1346 06/06/22 1651 06/07/22 0344 06/07/22 0612 06/07/22 0630  NA 130*   < > 134* 130*   < > 135   < > 136  --    < > 136 136 134* 137 138 138   < > 136   < > 139   < > 137 136 135 139 136 139 136  135 137 142 134*  K 3.3*   < > 4.0 3.4*   < > 3.9   < > 3.6  --    < > 4.0 3.9 3.9 4.1 4.3 4.8   < > 4.9   < > 3.9   < > 5.3* 5.6* 6.1* 4.6 3.9  4.4 5.2*  5.2* 4.2 2.7* 4.1  CL 89*   < > 90* 91*   < > 94*   < > 96*  --   --  98 96* 94* 98 98 98  --  100  --  103  --  100  --  100  --   --   --  101  101 102  --   --   CO2 26   < > 27 23   < > 29   < > 26  --   --  '27 26 26 25 27  '$ --   --   --   --   --   --  22  --  21*  --   --   --  22  22 20*  --   --   GLUCOSE 97   < > 124* 104*   < > 116*   < > 128*  --   --  101* 121* 132* 127* 143* 128*  --  148*  --  106*  --  148*  --  132*  --   --   --  129*  130* 134*  --   --   BUN 73*   < > 67* 50*   < > 40*   < > 44*  --   --  48* 50* 47* 50* 52* 49*  --  44*  --  43*  --  48*  --  49*  --   --   --  47*  47* 30*  --   --   CREATININE 20.21*   < > 20.22* 18.51*   < > 17.26*   < > 17.29*  --   --  19.27* 19.46* 19.06* 20.40* 20.83* >18.00*  --  >18.00*  --  >18.00*  --  19.52*  --  19.02*  --   --   --  16.63*  16.95* 11.40*  --   --    CALCIUM 9.0   < > 9.4 9.5   < > 9.8   < > 9.9  --   --  9.4 9.3 9.0 9.2 9.1  --   --   --   --   --   --  7.9*  --  8.0*  --   --   --  7.7*  7.6* 7.8*  --   --   MG  --   --  2.4  --   --   --   --   --  2.1  --   --  2.0 2.0 2.1  --   --   --   --   --   --   --  1.9  --  2.0  --   --   --  2.0 2.0  --   --   PHOS 8.8*  --   --   --   --   --   --   --   --   --   --  3.3 3.3 4.0 3.6  --   --   --   --   --   --   --   --   --   --   --   --  4.6 3.7  --   --   ALBUMIN 3.0*  2.8*  --   --  2.7*  --  3.2*  --   --  3.0*  --   --  2.6* 2.7* 2.7* 2.7*  --   --   --   --   --   --   --   --   --   --   --   --  2.7* 2.7*  --   --    < > = values in this interval not displayed.     Liver Function Tests: Recent Labs  Lab 06/01/22 0212 06/02/22 0312 06/03/22 0648 06/05/22 0539 06/06/22 1651 06/07/22 0344  AST 32 24 20  --   --   --   ALT '13 14 14  '$ --   --   --   ALKPHOS 46 42 35*  --   --   --   BILITOT 0.4 0.4 0.3  --   --   --   PROT 5.8* 5.9* 5.9*  --   --   --   ALBUMIN 2.6* 2.7* 2.7* 2.7* 2.7* 2.7*   No results for input(s): "LIPASE", "AMYLASE" in the last 168 hours. No results for input(s): "AMMONIA" in the last 168 hours. CBC: Recent Labs    06/02/22 0312 06/03/22 0648 06/06/22 1346 06/06/22 1651 06/07/22 0344 06/07/22 0612 06/07/22 0630  HGB 8.5*   < > 5.4* 7.9* 8.3* 5.1* 7.8*  MCV 95.4   < >  --  95.0 94.3  --   --   FERRITIN 1,105*  --   --   --   --   --   --   TIBC 293  --   --   --   --   --   --   IRON 79  --   --   --   --   --   --    < > = values in this interval not displayed.    Cardiac Enzymes: No results for input(s): "CKTOTAL", "CKMB", "CKMBINDEX", "TROPONINI" in the last 168 hours. CBG: Recent Labs  Lab 06/06/22 1833 06/06/22 1949 06/06/22 2322 06/07/22 0342 06/07/22 0750  GLUCAP 124* 110* 110* 107* 138*    Iron Studies: No results for input(s): "IRON", "TIBC", "TRANSFERRIN", "FERRITIN" in the last 72 hours. Studies/Results: DG Chest Port  1 View  Result Date: 06/07/2022 CLINICAL DATA:  Chest tube present status post CABG. EXAM: PORTABLE CHEST 1 VIEW COMPARISON:  Chest radiograph 1 day prior FINDINGS: Bilateral IJ central venous catheters are in place terminating in the mid SVC. Bilateral chest tubes are stable. Median sternotomy wires are stable. The heart is enlarged, unchanged. The upper mediastinal contours are stable. Left worse than right basilar airspace disease and a probable small left pleural effusion are unchanged. There is no new or worsening focal airspace disease. There is no appreciable pneumothorax. The bones are stable. IMPRESSION: 1. Unchanged left worse than right basilar airspace opacity and probable small left pleural effusion. No new or worsening focal airspace disease. 2. Support devices as above. Electronically Signed   By: Valetta Mole M.D.   On: 06/07/2022 08:30   DG CHEST PORT 1 VIEW  Result Date: 06/06/2022 CLINICAL DATA:  [id number] Encounter for central line placement [id number] EXAM: PORTABLE CHEST 1 VIEW COMPARISON:  Same day 06/06/2022 FINDINGS: Prior median sternotomy and postsurgical changes of CABG. Right neck catheter tip overlies the mid SVC. New left neck approach catheter tip overlies the mid SVC, pointing rightward. Unchanged enlarged cardiac silhouette. Endotracheal tube and orogastric tubes have been removed. There is bibasilar airspace disease and a small left pleural effusion, unchanged. No evidence of pneumothorax. Bones are unchanged. IMPRESSION:  New left neck approach catheter tip overlies the mid SVC, pointing rightward. Right neck catheter tip overlies the mid SVC. Unchanged bibasilar airspace disease and small left pleural effusion. Electronically Signed   By: Maurine Simmering M.D.   On: 06/06/2022 14:40   DG Chest Port 1 View  Result Date: 06/06/2022 CLINICAL DATA:  Status post CABG EXAM: PORTABLE CHEST 1 VIEW COMPARISON:  Radiograph 06/05/2022 FINDINGS: Median sternotomy with postsurgical changes of  CABG. Right neck catheter tip overlies the mid SVC. Endotracheal tube tip overlies the midthoracic trachea. Nasogastric tube tip overlies the stomach. Stable chest tubes. There is bibasilar airspace disease and a small left pleural effusion, not simply changed from prior. Decreased interstitial opacities. No evidence of pneumothorax. IMPRESSION: Postoperative chest with decreased pulmonary edema. Persistent bibasilar atelectasis and small left pleural effusion. Electronically Signed   By: Maurine Simmering M.D.   On: 06/06/2022 08:31   DG Chest Port 1 View  Result Date: 06/05/2022 CLINICAL DATA:  Status post CABG EXAM: PORTABLE CHEST 1 VIEW COMPARISON:  05/30/2022 FINDINGS: Endotracheal tube terminates 2.7 cm above carina. Right internal jugular line terminates at the mid to low SVC. Interval median sternotomy for CABG. Cardiomegaly accentuated by AP portable technique. Right and probable left-sided chest tubes. Possible small left pleural effusion. No pneumothorax. Mild interstitial edema, accentuated by extremely low lung volumes. Mild left base compressive atelectasis. IMPRESSION: Appropriate position of support apparatus, without pneumothorax or other complication. Congestive heart failure with probable small left pleural effusion and adjacent atelectasis. Electronically Signed   By: Abigail Miyamoto M.D.   On: 06/05/2022 13:14    Medications: Infusions:   prismasol BGK 4/2.5 500 mL/hr at 06/07/22 0654    prismasol BGK 4/2.5 300 mL/hr at 06/07/22 0654   sodium chloride Stopped (06/07/22 0203)   sodium chloride     sodium chloride 10 mL/hr at 06/07/22 0800   amiodarone 30 mg/hr (06/07/22 0800)   dexmedetomidine (PRECEDEX) IV infusion Stopped (06/07/22 0556)   dialysis solution 1.5% low-MG/low-CA     insulin Stopped (06/06/22 1949)   lactated ringers     lactated ringers     lactated ringers     niCARDipine Stopped (06/06/22 1115)   nitroGLYCERIN Stopped (06/05/22 1601)   norepinephrine (LEVOPHED)  Adult infusion 10 mcg/min (06/07/22 0800)   [COMPLETED] norepinephrine     phenylephrine (NEO-SYNEPHRINE) Adult infusion Stopped (06/06/22 2323)   prismasol BGK 4/2.5 2,000 mL/hr at 06/07/22 0654    Scheduled Medications:  acetaminophen  1,000 mg Oral Q6H   Or   acetaminophen (TYLENOL) oral liquid 160 mg/5 mL  1,000 mg Per Tube Q6H   aspirin EC  325 mg Oral Daily   Or   aspirin  324 mg Per Tube Daily   bisacodyl  10 mg Oral Daily   Or   bisacodyl  10 mg Rectal Daily   Chlorhexidine Gluconate Cloth  6 each Topical Daily   docusate sodium  200 mg Oral Daily   enoxaparin (LOVENOX) injection  40 mg Subcutaneous QHS   gentamicin cream  1 Application Topical Daily   insulin aspart  0-24 Units Subcutaneous Q4H   insulin detemir  10 Units Subcutaneous Daily   metoprolol tartrate  12.5 mg Oral BID   Or   metoprolol tartrate  12.5 mg Per Tube BID   mouth rinse  15 mL Mouth Rinse Q2H   pantoprazole  40 mg Oral Daily   simvastatin  20 mg Oral q morning   sodium chloride flush  3 mL  Intravenous Q12H    have reviewed scheduled and prn medications.  Physical Exam: General:lying in bed, awake and in nad Heart:normal rate, 1+ edema in BLE Lungs: bilateral chest rise, ventilated Abdomen:soft, Non-tender, moderate distention Extremities: warm and well perfused Dialysis Access: PD catheter site clean, left upper extremity AV fistula has good thrill and bruit but small and  deep  [name] [name] 06/07/2022,8:54 AM  LOS: 8 days

## 2022-06-07 NOTE — Progress Notes (Signed)
Rounding Note    Patient Name: [name] [name] Date of Encounter: 06/07/2022  [place] Cardiologist: Pixie Casino, MD   Subjective   Extubated.  On levophed at 10 mcg/min.  Reports chest soreness.  Denies any dyspnea.  Inpatient Medications    Scheduled Meds:  acetaminophen  1,000 mg Oral Q6H   Or   acetaminophen (TYLENOL) oral liquid 160 mg/5 mL  1,000 mg Per Tube Q6H   aspirin EC  325 mg Oral Daily   Or   aspirin  324 mg Per Tube Daily   bisacodyl  10 mg Oral Daily   Or   bisacodyl  10 mg Rectal Daily   Chlorhexidine Gluconate Cloth  6 each Topical Daily   docusate sodium  200 mg Oral Daily   enoxaparin (LOVENOX) injection  40 mg Subcutaneous QHS   gentamicin cream  1 Application Topical Daily   insulin aspart  0-24 Units Subcutaneous Q4H   insulin detemir  10 Units Subcutaneous Daily   metoprolol tartrate  12.5 mg Oral BID   Or   metoprolol tartrate  12.5 mg Per Tube BID   mouth rinse  15 mL Mouth Rinse Q2H   pantoprazole  40 mg Oral Daily   simvastatin  20 mg Oral q morning   sodium chloride flush  3 mL Intravenous Q12H   Continuous Infusions:   prismasol BGK 4/2.5 500 mL/hr at 06/07/22 0654    prismasol BGK 4/2.5 300 mL/hr at 06/07/22 0654   sodium chloride Stopped (06/07/22 0203)   sodium chloride     sodium chloride 10 mL/hr at 06/07/22 0900   amiodarone 30 mg/hr (06/07/22 0900)   dexmedetomidine (PRECEDEX) IV infusion Stopped (06/07/22 0556)   dialysis solution 1.5% low-MG/low-CA     insulin Stopped (06/06/22 1949)   lactated ringers     lactated ringers     lactated ringers     niCARDipine Stopped (06/06/22 1115)   nitroGLYCERIN Stopped (06/05/22 1601)   norepinephrine (LEVOPHED) Adult infusion 10 mcg/min (06/07/22 0900)   [COMPLETED] norepinephrine     phenylephrine (NEO-SYNEPHRINE) Adult infusion Stopped (06/06/22 2323)   prismasol BGK 4/2.5 2,000 mL/hr at 06/07/22 0654   PRN Meds: sodium chloride, albuterol, dextrose, heparin,  lactated ringers, metoprolol tartrate, midazolam, morphine injection, [COMPLETED] norepinephrine, ondansetron (ZOFRAN) IV, mouth rinse, oxyCODONE, sodium chloride, sodium chloride flush, traMADol   Vital Signs    Vitals:   06/07/22 0815 06/07/22 0830 06/07/22 0845 06/07/22 0900  BP:      Pulse: 80 80 80 80  Resp: (!) 21 (!) '22 17 19  '$ Temp:      TempSrc:      SpO2: 95% 98% 95% 97%  Weight:      Height:        Intake/Output Summary (Last 24 hours) at 06/07/2022 0915 Last data filed at 06/07/2022 0900 Gross per 24 hour  Intake 2001.25 ml  Output 3998 ml  Net -1996.75 ml      06/07/2022    5:00 AM 06/06/2022    3:15 AM 06/04/2022    5:19 AM  Last 3 Weights  Weight (lbs) 240 lb 4.8 oz 249 lb 1.9 oz 235 lb 7.2 oz  Weight (kg) 109 kg 113 kg 106.8 kg      Telemetry    V paced at 80.  Underlying rhythm is 2:1 block in 40s - Personally Reviewed  ECG    No new ECG - Personally Reviewed  Physical Exam   GEN: No acute  distress.   Neck:  RIJ CVC Cardiac: RRR, no murmurs Respiratory: Diminished breath sounds GI: Soft, nontende MS: 2+ edema Neuro:  Nonfocal  Psych: Normal affect   Labs    High Sensitivity Troponin:   Recent Labs  Lab 05/30/22 0341 05/30/22 0640 05/30/22 1148  TROPONINIHS 100* 142* 330*     Chemistry Recent Labs  Lab 06/01/22 0212 06/02/22 0312 06/03/22 6811 06/05/22 0539 06/05/22 0818 06/06/22 0303 06/06/22 0938 06/06/22 1651 06/07/22 0344 06/07/22 0612 06/07/22 0630  NA 136 134* 137 138   < > 135   < > 136  135 137 142 134*  K 3.9 3.9 4.1 4.3   < > 6.1*   < > 5.2*  5.2* 4.2 2.7* 4.1  CL 96* 94* 98 98   < > 100  --  101  101 102  --   --   CO2 '26 26 25 27   '$ < > 21*  --  22  22 20*  --   --   GLUCOSE 121* 132* 127* 143*   < > 132*  --  129*  130* 134*  --   --   BUN 50* 47* 50* 52*   < > 49*  --  47*  47* 30*  --   --   CREATININE 19.46* 19.06* 20.40* 20.83*   < > 19.02*  --  16.63*  16.95* 11.40*  --   --   CALCIUM 9.3 9.0 9.2  9.1   < > 8.0*  --  7.7*  7.6* 7.8*  --   --   MG 2.0 2.0 2.1  --    < > 2.0  --  2.0 2.0  --   --   PROT 5.8* 5.9* 5.9*  --   --   --   --   --   --   --   --   ALBUMIN 2.6* 2.7* 2.7* 2.7*  --   --   --  2.7* 2.7*  --   --   AST 32 24 20  --   --   --   --   --   --   --   --   ALT '13 14 14  '$ --   --   --   --   --   --   --   --   ALKPHOS 46 42 35*  --   --   --   --   --   --   --   --   BILITOT 0.4 0.4 0.3  --   --   --   --   --   --   --   --   GFRNONAA 2* 2* 2* 2*   < > 2*  --  3*  3* 4*  --   --   ANIONGAP '14 14 14 13   '$ < > 14  --  '13  12 15  '$ --   --    < > = values in this interval not displayed.    Lipids  Recent Labs  Lab 06/03/22 0648  CHOL 88  TRIG 98  HDL 32*  LDLCALC 36  CHOLHDL 2.8    Hematology Recent Labs  Lab 06/06/22 1235 06/06/22 1346 06/06/22 1651 06/07/22 0344 06/07/22 0612 06/07/22 0630  WBC 8.7  --  10.4 9.3  --   --   RBC 2.48*  --  2.60* 2.65*  --   --   HGB 7.5*   < >  7.9* 8.3* 5.1* 7.8*  HCT 24.0*   < > 24.7* 25.0* 15.0* 23.0*  MCV 96.8  --  95.0 94.3  --   --   MCH 30.2  --  30.4 31.3  --   --   MCHC 31.3  --  32.0 33.2  --   --   RDW 16.5*  --  16.7* 16.5*  --   --   PLT 108*  --  133* 134*  --   --    < > = values in this interval not displayed.   Thyroid No results for input(s): "TSH", "FREET4" in the last 168 hours.  BNPNo results for input(s): "BNP", "PROBNP" in the last 168 hours.  DDimer No results for input(s): "DDIMER" in the last 168 hours.   Radiology    DG Chest Port 1 View  Result Date: 06/07/2022 CLINICAL DATA:  Chest tube present status post CABG. EXAM: PORTABLE CHEST 1 VIEW COMPARISON:  Chest radiograph 1 day prior FINDINGS: Bilateral IJ central venous catheters are in place terminating in the mid SVC. Bilateral chest tubes are stable. Median sternotomy wires are stable. The heart is enlarged, unchanged. The upper mediastinal contours are stable. Left worse than right basilar airspace disease and a probable small left  pleural effusion are unchanged. There is no new or worsening focal airspace disease. There is no appreciable pneumothorax. The bones are stable. IMPRESSION: 1. Unchanged left worse than right basilar airspace opacity and probable small left pleural effusion. No new or worsening focal airspace disease. 2. Support devices as above. Electronically Signed   By: Valetta Mole M.D.   On: 06/07/2022 08:30   DG CHEST PORT 1 VIEW  Result Date: 06/06/2022 CLINICAL DATA:  [id number] Encounter for central line placement [id number] EXAM: PORTABLE CHEST 1 VIEW COMPARISON:  Same day 06/06/2022 FINDINGS: Prior median sternotomy and postsurgical changes of CABG. Right neck catheter tip overlies the mid SVC. New left neck approach catheter tip overlies the mid SVC, pointing rightward. Unchanged enlarged cardiac silhouette. Endotracheal tube and orogastric tubes have been removed. There is bibasilar airspace disease and a small left pleural effusion, unchanged. No evidence of pneumothorax. Bones are unchanged. IMPRESSION: New left neck approach catheter tip overlies the mid SVC, pointing rightward. Right neck catheter tip overlies the mid SVC. Unchanged bibasilar airspace disease and small left pleural effusion. Electronically Signed   By: Maurine Simmering M.D.   On: 06/06/2022 14:40   DG Chest Port 1 View  Result Date: 06/06/2022 CLINICAL DATA:  Status post CABG EXAM: PORTABLE CHEST 1 VIEW COMPARISON:  Radiograph 06/05/2022 FINDINGS: Median sternotomy with postsurgical changes of CABG. Right neck catheter tip overlies the mid SVC. Endotracheal tube tip overlies the midthoracic trachea. Nasogastric tube tip overlies the stomach. Stable chest tubes. There is bibasilar airspace disease and a small left pleural effusion, not simply changed from prior. Decreased interstitial opacities. No evidence of pneumothorax. IMPRESSION: Postoperative chest with decreased pulmonary edema. Persistent bibasilar atelectasis and small left pleural effusion.  Electronically Signed   By: Maurine Simmering M.D.   On: 06/06/2022 08:31   DG Chest Port 1 View  Result Date: 06/05/2022 CLINICAL DATA:  Status post CABG EXAM: PORTABLE CHEST 1 VIEW COMPARISON:  05/30/2022 FINDINGS: Endotracheal tube terminates 2.7 cm above carina. Right internal jugular line terminates at the mid to low SVC. Interval median sternotomy for CABG. Cardiomegaly accentuated by AP portable technique. Right and probable left-sided chest tubes. Possible small left pleural effusion. No pneumothorax. Mild interstitial edema, accentuated  by extremely low lung volumes. Mild left base compressive atelectasis. IMPRESSION: Appropriate position of support apparatus, without pneumothorax or other complication. Congestive heart failure with probable small left pleural effusion and adjacent atelectasis. Electronically Signed   By: Abigail Miyamoto M.D.   On: 06/05/2022 13:14    Cardiac Studies     Patient Profile     71 y.o. male  renal disease on peritoneal dialysis, coronary artery disease with history of PCI of the right coronary artery for evaluation of unstable angina.  Cardiac catheterization revealed severe in-stent restenosis in the mid right coronary artery as well as the distal RCA lesion, 75% proximal LAD and 50% circumflex.  Echocardiogram shows normal LV function and mild aortic insufficiency.   Assessment & Plan    NSTEMI/shock-cardiac catheterization revealed severe in-stent restenosis in the right coronary artery and also with proximal LAD disease.  Echocardiogram shows preserved LV function.  Status post CABG with LIMA-LAD, SVG-PDA on 06/05/2022.  Currently on levophed at 10 mcg/min -Continue aspirin, statin  -Wean pressors as tolerated -Volume removal via CRRT  Acute hypoxic respiratory failure: Due to pulmonary edema following surgery.  PCCM following.  Now extubated. Volume removal via CRRT  2:1 AV block: currently being with epicardial pacing, underlying rhythm is 2:1 AV block in  40s, LBBB with QRS 140.  From review of telemetry, appears was having 2:1 AV block in 40s prior to his surgery.  -Appears was started on amio prophylactically after his surgery, would discontinue -Suspect will need PPM, will d/w EP   End-stage renal disease-peritoneal dialysis dependent. Nephrology following, currently on CRRT   Hyperlipidemia-continue statin.   Obstructive sleep apnea-continue CPAP.    For questions or updates, please contact [place] Please consult [email or web address] for contact info under   CRITICAL CARE TIME: I have spent a total of 35 minutes with patient reviewing hospital notes, telemetry, EKGs, labs and examining the patient as well as establishing an assessment and plan that was discussed with the patient.  > 50% of time was spent in direct patient care. The patient is critically ill with multi-organ system failure and requires high complexity decision making for assessment and support, frequent evaluation and titration of therapies, application of advanced monitoring technologies and extensive interpretation of multiple databases.    Signed, Donato Heinz, MD  06/07/2022, 9:15 AM

## 2022-06-07 NOTE — Progress Notes (Signed)
Patient became more restless and forgetful throughout the day so Precedex was restarted on day shift. Levophed demands increased to the ceiling of 10 mcg. In attempts to have patient more compliant with Bi-Pap, Precedex dose was increased. A-line BP sitting at 90s/50s, MAPs in high 50s. In efforts to aid with BP, pacing rate increased to 90bpm, started pulling less/keeping even on CRRT while waiting for new orders.  Paged TCTS; ABG done. Orders for 1g calcium chloride and norepi ceiling increased to 40mg.

## 2022-06-07 NOTE — Progress Notes (Signed)
[address]       [place] [address]             [phone]                 2 Days Post-Op Procedure(s) (LRB): OFF PUMP CORONARY ARTERY BYPASS GRAFTING (CABG) X 2 BYPASSES USING LEFT INTERNAL MAMMARY ARTERY AND RIGHT LEG GREATER SAPHENOUS VEIN HARVESTED ENDOSCOPICALLY (N/A) TRANSESOPHAGEAL ECHOCARDIOGRAM (TEE) (N/A)   Events: Extubated yesterday. Remains bradycardic.  _______________________________________________________________ Vitals: BP 100/60   Pulse 80   Temp 98.2 F (36.8 C) (Axillary)   Resp (!) 21   Ht '5\' 4"'$  (1.626 m)   Wt 109 kg   SpO2 96%   BMI 41.25 kg/m  Filed Weights   06/04/22 0519 06/06/22 0315 06/07/22 0500  Weight: 106.8 kg 113 kg 109 kg     - Neuro: alert NAD  - Cardiovascular: Sinus bradycardia  Drips: amio 30, Levo 10.   CVP:  [11 mmHg-31 mmHg] 15 mmHg  - Pulm:  Easy work of breathing  ABG    Component Value Date/Time   PHART 7.373 06/07/2022 0630   PCO2ART 39.3 06/07/2022 0630   PO2ART 115 (H) 06/07/2022 0630   HCO3 23.0 06/07/2022 0630   TCO2 24 06/07/2022 0630   ACIDBASEDEF 2.0 06/07/2022 0630   O2SAT 98 06/07/2022 0630    - Abd: ND - Extremity: warm  .Intake/Output      11/21 0701 11/22 0700 11/22 0701 11/23 0700   P.O.  80   I.V. (mL/kg) 1848.1 (17) 94.1 (0.9)   Blood     Other 130 30   NG/GT 150    IV Piggyback 50    Total Intake(mL/kg) 2178.1 (20) 204.1 (1.9)   Urine (mL/kg/hr) 65 (0)    Blood     Chest Tube 140 10   CRRT 3387 659   Total Output 3592 669   Net -1413.9 -464.9           _______________________________________________________________ Labs:    Latest Ref Rng & Units 06/07/2022    6:30 AM 06/07/2022    6:12 AM 06/07/2022    3:44 AM  CBC  WBC 4.0 - 10.5 K/uL   9.3   Hemoglobin 13.0 - 17.0 g/dL 7.8  5.1  8.3   Hematocrit 39.0 - 52.0 % 23.0  15.0  25.0   Platelets 150 - 400 K/uL   134       Latest Ref Rng & Units 06/07/2022    6:30 AM 06/07/2022    6:12 AM  06/07/2022    3:44 AM  CMP  Glucose 70 - 99 mg/dL   134   BUN 8 - 23 mg/dL   30   Creatinine 0.61 - 1.24 mg/dL   11.40   Sodium 135 - 145 mmol/L 134  142  137   Potassium 3.5 - 5.1 mmol/L 4.1  2.7  4.2   Chloride 98 - 111 mmol/L   102   CO2 22 - 32 mmol/L   20   Calcium 8.9 - 10.3 mg/dL   7.8     -  _______________________________________________________________  Assessment and Plan: POD 2 s/p CABG  Neuro: Pain controlled CV: Holding beta-blocker and amiodarone for now.  EP has been consulted for his bradycardia.  Continue ventricular pacing.  Will add plavix for NSTEMI after wire removal.  Will add midodrine today and wean Levophed as tolerated. Pulm: Incentive spirometer and pulmonary toilet. Renal: Mains on CRRT  GI: Renal diet Heme: stable ID: [email or web address] Endo: SSI Dispo: continue ICU care   Lajuana Matte 06/07/2022 10:29 AM

## 2022-06-07 NOTE — Progress Notes (Signed)
CT surgery p.m. rounds  Patient extubated earlier today Now receiving hemodialysis P.m. labs satisfactory Inotrope requirements low to medium dose nor epi for blood pressure support EP following patient for second-degree AV block  Blood pressure (!) 102/57, pulse 81, temperature 98.8 F (37.1 C), temperature source Oral, resp. rate (!) 28, height '5\' 4"'$  (1.626 m), weight 109 kg, SpO2 100 %.

## 2022-06-07 NOTE — Procedures (Signed)
I saw and evaluated the patient on CRRT.  I reviewed the last 24 hours events.  Adjustments to CRRT prescription are made as needed.  Changed to 4K bath  Filed Weights   06/04/22 0519 06/06/22 0315 06/07/22 0500  Weight: 106.8 kg 113 kg 109 kg    Recent Labs  Lab 06/07/22 0344 06/07/22 0612 06/07/22 0630  NA 137   < > 134*  K 4.2   < > 4.1  CL 102  --   --   CO2 20*  --   --   GLUCOSE 134*  --   --   BUN 30*  --   --   CREATININE 11.40*  --   --   CALCIUM 7.8*  --   --   PHOS 3.7  --   --    < > = values in this interval not displayed.    Recent Labs  Lab 06/01/22 0212 06/02/22 0312 06/03/22 0648 06/04/22 0557 06/06/22 1235 06/06/22 1346 06/06/22 1651 06/07/22 0344 06/07/22 0612 06/07/22 0630  WBC 5.6 4.9 5.1   < > 8.7  --  10.4 9.3  --   --   NEUTROABS 4.4 3.8 4.0  --   --   --   --   --   --   --   HGB 9.0* 8.5* 8.3*   < > 7.5*   < > 7.9* 8.3* 5.1* 7.8*  HCT 27.8* 27.2* 25.0*   < > 24.0*   < > 24.7* 25.0* 15.0* 23.0*  MCV 93.3 95.4 94.0   < > 96.8  --  95.0 94.3  --   --   PLT 105* 106* 90*   < > 108*  --  133* 134*  --   --    < > = values in this interval not displayed.    Scheduled Meds:  acetaminophen  1,000 mg Oral Q6H   Or   acetaminophen (TYLENOL) oral liquid 160 mg/5 mL  1,000 mg Per Tube Q6H   aspirin EC  325 mg Oral Daily   Or   aspirin  324 mg Per Tube Daily   bisacodyl  10 mg Oral Daily   Or   bisacodyl  10 mg Rectal Daily   Chlorhexidine Gluconate Cloth  6 each Topical Daily   docusate sodium  200 mg Oral Daily   enoxaparin (LOVENOX) injection  40 mg Subcutaneous QHS   gentamicin cream  1 Application Topical Daily   insulin aspart  0-24 Units Subcutaneous Q4H   insulin detemir  10 Units Subcutaneous Daily   metoprolol tartrate  12.5 mg Oral BID   Or   metoprolol tartrate  12.5 mg Per Tube BID   mouth rinse  15 mL Mouth Rinse Q2H   pantoprazole  40 mg Oral Daily   simvastatin  20 mg Oral q morning   sodium chloride flush  3 mL  Intravenous Q12H   Continuous Infusions:   prismasol BGK 4/2.5 500 mL/hr at 06/07/22 0654    prismasol BGK 4/2.5 300 mL/hr at 06/07/22 0654   sodium chloride Stopped (06/07/22 0203)   sodium chloride     sodium chloride 10 mL/hr at 06/07/22 0800   amiodarone 30 mg/hr (06/07/22 0800)   dexmedetomidine (PRECEDEX) IV infusion Stopped (06/07/22 0556)   dialysis solution 1.5% low-MG/low-CA     insulin Stopped (06/06/22 1949)   lactated ringers     lactated ringers     lactated ringers     niCARDipine Stopped (  06/06/22 1115)   nitroGLYCERIN Stopped (06/05/22 1601)   norepinephrine (LEVOPHED) Adult infusion 10 mcg/min (06/07/22 0800)   [COMPLETED] norepinephrine     phenylephrine (NEO-SYNEPHRINE) Adult infusion Stopped (06/06/22 2323)   prismasol BGK 4/2.5 2,000 mL/hr at 06/07/22 0654   PRN Meds:.sodium chloride, albuterol, dextrose, heparin, lactated ringers, metoprolol tartrate, midazolam, morphine injection, [COMPLETED] norepinephrine, ondansetron (ZOFRAN) IV, mouth rinse, oxyCODONE, sodium chloride, sodium chloride flush, traMADol   Santiago Bumpers,  MD 06/07/2022, 8:56 AM

## 2022-06-07 NOTE — Consult Note (Signed)
   [facility] Inpatient Consult   06/07/2022  [name] [name] [birth year] [id number]  [name] [ACO] Patient: Medicare ACO REACH  Primary Care Provider:  Carrolyn Meiers, MD   Patient is currently active with [facility] for chronic disease management services.  Patient has been engaged by a [facility] NP-Geriatric.  Our community based plan of care has focused on disease management and community resource support.    *Acknowledgment of hospital admission and length of stay 8 days.  Patient is in ICU level of care at the time of this review as patient was extubated 06/05/22  Plan:  Continue to follow progress. Update [facility] NP-G of any known post hospital follow up needs.  Of note, [name] does not replace or interfere with any services that are needed or arranged by inpatient [facility] care management team.   For additional questions or referrals please contact:  Natividad Brood, RN BSN [place]  [phone] business mobile phone Toll free office [phone]  *[place]  [phone] Fax number: [phone] [name]

## 2022-06-08 ENCOUNTER — Inpatient Hospital Stay (HOSPITAL_COMMUNITY): Payer: Medicare Other

## 2022-06-08 DIAGNOSIS — I4430 Unspecified atrioventricular block: Secondary | ICD-10-CM | POA: Diagnosis not present

## 2022-06-08 DIAGNOSIS — R579 Shock, unspecified: Secondary | ICD-10-CM | POA: Diagnosis not present

## 2022-06-08 DIAGNOSIS — J9601 Acute respiratory failure with hypoxia: Secondary | ICD-10-CM | POA: Diagnosis not present

## 2022-06-08 DIAGNOSIS — I214 Non-ST elevation (NSTEMI) myocardial infarction: Secondary | ICD-10-CM | POA: Diagnosis not present

## 2022-06-08 DIAGNOSIS — Z951 Presence of aortocoronary bypass graft: Secondary | ICD-10-CM | POA: Diagnosis not present

## 2022-06-08 DIAGNOSIS — N186 End stage renal disease: Secondary | ICD-10-CM | POA: Diagnosis not present

## 2022-06-08 LAB — TYPE AND SCREEN
ABO/RH(D): B POS
Antibody Screen: NEGATIVE
Unit division: 0
Unit division: 0
Unit division: 0
Unit division: 0
Unit division: 0
Unit division: 0
Unit division: 0

## 2022-06-08 LAB — RENAL FUNCTION PANEL
Albumin: 2.5 g/dL — ABNORMAL LOW (ref 3.5–5.0)
Albumin: 2.4 g/dL — ABNORMAL LOW (ref 3.5–5.0)
Anion gap: 12 (ref 5–15)
Anion gap: 12 (ref 5–15)
BUN: 19 mg/dL (ref 8–23)
BUN: 18 mg/dL (ref 8–23)
CO2: 24 mmol/L (ref 22–32)
CO2: 24 mmol/L (ref 22–32)
Calcium: 8.6 mg/dL — ABNORMAL LOW (ref 8.9–10.3)
Calcium: 8.5 mg/dL — ABNORMAL LOW (ref 8.9–10.3)
Chloride: 103 mmol/L (ref 98–111)
Chloride: 102 mmol/L (ref 98–111)
Creatinine, Ser: 6.08 mg/dL — ABNORMAL HIGH (ref 0.61–1.24)
Creatinine, Ser: 5.1 mg/dL — ABNORMAL HIGH (ref 0.61–1.24)
GFR, Estimated: 9 mL/min — ABNORMAL LOW (ref >60)
GFR, Estimated: 11 mL/min — ABNORMAL LOW (ref >60)
Glucose, Bld: 126 mg/dL — ABNORMAL HIGH (ref 70–99)
Glucose, Bld: 129 mg/dL — ABNORMAL HIGH (ref 70–99)
Phosphorus: 2.8 mg/dL (ref 2.5–4.6)
Phosphorus: 2.5 mg/dL (ref 2.5–4.6)
Potassium: 4.5 mmol/L (ref 3.5–5.1)
Potassium: 4.3 mmol/L (ref 3.5–5.1)
Sodium: 139 mmol/L (ref 135–145)
Sodium: 138 mmol/L (ref 135–145)

## 2022-06-08 LAB — GLUCOSE, CAPILLARY
Glucose-Capillary: 118 mg/dL — ABNORMAL HIGH (ref 70–99)
Glucose-Capillary: 146 mg/dL — ABNORMAL HIGH (ref 70–99)
Glucose-Capillary: 136 mg/dL — ABNORMAL HIGH (ref 70–99)
Glucose-Capillary: 126 mg/dL — ABNORMAL HIGH (ref 70–99)
Glucose-Capillary: 91 mg/dL (ref 70–99)

## 2022-06-08 LAB — BPAM RBC
Blood Product Expiration Date: 202311252359
Blood Product Expiration Date: 202312092359
Blood Product Expiration Date: 202312102359
Blood Product Expiration Date: 202312102359
Blood Product Expiration Date: 202312122359
Blood Product Expiration Date: 202312122359
Blood Product Expiration Date: 202312122359
ISSUE DATE / TIME: 202311200650
ISSUE DATE / TIME: 202311200826
ISSUE DATE / TIME: 202311200826
ISSUE DATE / TIME: 202311211307
ISSUE DATE / TIME: 202311221607
Unit Type and Rh: 1700
Unit Type and Rh: 7300
Unit Type and Rh: 7300
Unit Type and Rh: 7300
Unit Type and Rh: 7300
Unit Type and Rh: 7300
Unit Type and Rh: 7300

## 2022-06-08 LAB — MAGNESIUM: Magnesium: 2.4 mg/dL (ref 1.7–2.4)

## 2022-06-08 LAB — CBC
HCT: 25.3 % — ABNORMAL LOW (ref 39.0–52.0)
Hemoglobin: 7.8 g/dL — ABNORMAL LOW (ref 13.0–17.0)
MCH: 30.1 pg (ref 26.0–34.0)
MCHC: 30.8 g/dL (ref 30.0–36.0)
MCV: 97.7 fL (ref 80.0–100.0)
Platelets: 155 10*3/uL (ref 150–400)
RBC: 2.59 MIL/uL — ABNORMAL LOW (ref 4.22–5.81)
RDW: 16.3 % — ABNORMAL HIGH (ref 11.5–15.5)
WBC: 5.1 10*3/uL (ref 4.0–10.5)
nRBC: 1 % — ABNORMAL HIGH (ref 0.0–0.2)

## 2022-06-08 MED ORDER — MIDODRINE HCL 5 MG PO TABS
15.0000 mg | ORAL_TABLET | Freq: Three times a day (TID) | ORAL | Status: DC
  Administered 2022-06-08 – 2022-06-19 (×34): 15 mg via ORAL
  Filled 2022-06-08 (×34): qty 3

## 2022-06-08 MED ORDER — SODIUM CHLORIDE 0.9 % IV SOLN: INTRAVENOUS | Status: DC | PRN

## 2022-06-08 MED ORDER — INSULIN ASPART 100 UNIT/ML IJ SOLN
0.0000 [IU] | Freq: Three times a day (TID) | INTRAMUSCULAR | Status: DC
  Administered 2022-06-08 – 2022-06-12 (×5): 2 [IU] via SUBCUTANEOUS

## 2022-06-08 MED ORDER — ORAL CARE MOUTH RINSE: 15.0000 mL | OROMUCOSAL | Status: DC | PRN

## 2022-06-08 NOTE — Procedures (Signed)
I saw and evaluated the patient on CRRT.  I reviewed the last 24 hours events.  Adjustments to CRRT prescription are made as needed.  4K bath. Limit UF and prioritize lowering pressor requirement. Then can increase UF back to 100-150cc/hr goal. Likely still has some excess volume  Filed Weights   06/06/22 0315 06/07/22 0500 06/08/22 0500  Weight: 113 kg 109 kg 105.1 kg    Recent Labs  Lab 06/08/22 0415  NA 139  K 4.5  CL 103  CO2 24  GLUCOSE 126*  BUN 19  CREATININE 6.08*  CALCIUM 8.6*  PHOS 2.8    Recent Labs  Lab 06/02/22 0312 06/03/22 0648 06/04/22 0557 06/06/22 1651 06/07/22 0344 06/07/22 0612 06/07/22 0630 06/07/22 2136 06/08/22 0415  WBC 4.9 5.1   < > 10.4 9.3  --   --   --  5.1  NEUTROABS 3.8 4.0  --   --   --   --   --   --   --   HGB 8.5* 8.3*   < > 7.9* 8.3*   < > 7.8* 7.1* 7.8*  HCT 27.2* 25.0*   < > 24.7* 25.0*   < > 23.0* 21.0* 25.3*  MCV 95.4 94.0   < > 95.0 94.3  --   --   --  97.7  PLT 106* 90*   < > 133* 134*  --   --   --  155   < > = values in this interval not displayed.    Scheduled Meds:  acetaminophen  1,000 mg Oral Q6H   Or   acetaminophen (TYLENOL) oral liquid 160 mg/5 mL  1,000 mg Per Tube Q6H   aspirin EC  325 mg Oral Daily   Or   aspirin  324 mg Per Tube Daily   bisacodyl  10 mg Oral Daily   Or   bisacodyl  10 mg Rectal Daily   Chlorhexidine Gluconate Cloth  6 each Topical Daily   docusate sodium  200 mg Oral Daily   gentamicin cream  1 Application Topical Daily   heparin injection (subcutaneous)  5,000 Units Subcutaneous Q8H   insulin aspart  0-24 Units Subcutaneous Q4H   insulin detemir  10 Units Subcutaneous Daily   midodrine  10 mg Oral TID WC   pantoprazole  40 mg Oral Daily   simvastatin  20 mg Oral q morning   sodium chloride flush  3 mL Intravenous Q12H   Continuous Infusions:   prismasol BGK 4/2.5 500 mL/hr at 06/08/22 0330    prismasol BGK 4/2.5 300 mL/hr at 06/07/22 2350   sodium chloride     dexmedetomidine  (PRECEDEX) IV infusion Stopped (06/08/22 0439)   dialysis solution 1.5% low-MG/low-CA     norepinephrine (LEVOPHED) Adult infusion 10 mcg/min (06/08/22 0800)   prismasol BGK 4/2.5 2,000 mL/hr at 06/08/22 0757   PRN Meds:.albuterol, heparin, metoprolol tartrate, morphine injection, ondansetron (ZOFRAN) IV, mouth rinse, oxyCODONE, sodium chloride, traMADol   Santiago Bumpers,  MD 06/08/2022, 8:18 AM

## 2022-06-08 NOTE — Progress Notes (Signed)
Rounding Note    Patient Name: [name] [name] Date of Encounter: 06/08/2022  [place] Cardiologist: Pixie Casino, MD   Subjective   Confusion overnight, required sedation.  Net negative 3.7L yesterday on CRRT.  CVP 10 this AM.  On Levophed at 11 mcg/min.  He denies any dyspnea, does report some chest soreness.  Inpatient Medications    Scheduled Meds:  acetaminophen  1,000 mg Oral Q6H   Or   acetaminophen (TYLENOL) oral liquid 160 mg/5 mL  1,000 mg Per Tube Q6H   aspirin EC  325 mg Oral Daily   Or   aspirin  324 mg Per Tube Daily   bisacodyl  10 mg Oral Daily   Or   bisacodyl  10 mg Rectal Daily   Chlorhexidine Gluconate Cloth  6 each Topical Daily   docusate sodium  200 mg Oral Daily   gentamicin cream  1 Application Topical Daily   heparin injection (subcutaneous)  5,000 Units Subcutaneous Q8H   insulin aspart  0-24 Units Subcutaneous TID AC & HS   insulin detemir  10 Units Subcutaneous Daily   midodrine  10 mg Oral TID WC   pantoprazole  40 mg Oral Daily   simvastatin  20 mg Oral q morning   sodium chloride flush  3 mL Intravenous Q12H   Continuous Infusions:   prismasol BGK 4/2.5 500 mL/hr at 06/08/22 0330    prismasol BGK 4/2.5 300 mL/hr at 06/07/22 2350   sodium chloride     sodium chloride 10 mL/hr at 06/08/22 0900   dexmedetomidine (PRECEDEX) IV infusion Stopped (06/08/22 0439)   dialysis solution 1.5% low-MG/low-CA     norepinephrine (LEVOPHED) Adult infusion 10 mcg/min (06/08/22 0900)   prismasol BGK 4/2.5 2,000 mL/hr at 06/08/22 0757   PRN Meds: sodium chloride, albuterol, heparin, metoprolol tartrate, morphine injection, ondansetron (ZOFRAN) IV, mouth rinse, mouth rinse, oxyCODONE, sodium chloride, traMADol   Vital Signs    Vitals:   06/08/22 0800 06/08/22 0815 06/08/22 0830 06/08/22 0845  BP: 98/66     Pulse: 89 90 90 90  Resp: (!) 22 20 (!) 22 16  Temp:      TempSrc:      SpO2: 100% 100% 100% 100%  Weight:      Height:         Intake/Output Summary (Last 24 hours) at 06/08/2022 0949 Last data filed at 06/08/2022 0907 Gross per 24 hour  Intake 1073.32 ml  Output 4716 ml  Net -3642.68 ml       06/08/2022    5:00 AM 06/07/2022    5:00 AM 06/06/2022    3:15 AM  Last 3 Weights  Weight (lbs) 231 lb 11.3 oz 240 lb 4.8 oz 249 lb 1.9 oz  Weight (kg) 105.1 kg 109 kg 113 kg      Telemetry    V paced at 90.  Underlying rhythm is 2:1 block in 40s - Personally Reviewed  ECG    No new ECG - Personally Reviewed  Physical Exam   GEN: No acute distress.   Neck:  RIJ CVC Cardiac: RRR, no murmurs Respiratory: Diminished breath sounds GI: Soft, nontende MS: 1+ edema Neuro:  Nonfocal  Psych: Normal affect   Labs    High Sensitivity Troponin:   Recent Labs  Lab 05/30/22 0341 05/30/22 0640 05/30/22 1148  TROPONINIHS 100* 142* 330*      Chemistry Recent Labs  Lab 06/02/22 0312 06/03/22 7564 06/05/22 0539 06/06/22 1651 06/07/22 0344  06/07/22 0612 06/07/22 1613 06/07/22 2136 06/08/22 0415  NA 134* 137   < > 136  135 137   < > 137 136 139  K 3.9 4.1   < > 5.2*  5.2* 4.2   < > 4.5 4.4 4.5  CL 94* 98   < > 101  101 102  --  101  --  103  CO2 26 25   < > 22  22 20*  --  23  --  24  GLUCOSE 132* 127*   < > 129*  130* 134*  --  133*  --  126*  BUN 47* 50*   < > 47*  47* 30*  --  23  --  19  CREATININE 19.06* 20.40*   < > 16.63*  16.95* 11.40*  --  8.11*  --  6.08*  CALCIUM 9.0 9.2   < > 7.7*  7.6* 7.8*  --  8.3*  --  8.6*  MG 2.0 2.1   < > 2.0 2.0  --   --   --  2.4  PROT 5.9* 5.9*  --   --   --   --   --   --   --   ALBUMIN 2.7* 2.7*   < > 2.7* 2.7*  --  2.7*  --  2.5*  AST 24 20  --   --   --   --   --   --   --   ALT 14 14  --   --   --   --   --   --   --   ALKPHOS 42 35*  --   --   --   --   --   --   --   BILITOT 0.4 0.3  --   --   --   --   --   --   --   GFRNONAA 2* 2*   < > 3*  3* 4*  --  6*  --  9*  ANIONGAP 14 14   < > '13  12 15  '$ --  13  --  12   < > = values in this  interval not displayed.     Lipids  Recent Labs  Lab 06/03/22 0648  CHOL 88  TRIG 98  HDL 32*  LDLCALC 36  CHOLHDL 2.8     Hematology Recent Labs  Lab 06/06/22 1651 06/07/22 0344 06/07/22 0612 06/07/22 0630 06/07/22 2136 06/08/22 0415  WBC 10.4 9.3  --   --   --  5.1  RBC 2.60* 2.65*  --   --   --  2.59*  HGB 7.9* 8.3*   < > 7.8* 7.1* 7.8*  HCT 24.7* 25.0*   < > 23.0* 21.0* 25.3*  MCV 95.0 94.3  --   --   --  97.7  MCH 30.4 31.3  --   --   --  30.1  MCHC 32.0 33.2  --   --   --  30.8  RDW 16.7* 16.5*  --   --   --  16.3*  PLT 133* 134*  --   --   --  155   < > = values in this interval not displayed.    Thyroid No results for input(s): "TSH", "FREET4" in the last 168 hours.  BNPNo results for input(s): "BNP", "PROBNP" in the last 168 hours.  DDimer No results for input(s): "DDIMER" in the last 168 hours.  Radiology    DG CHEST PORT 1 VIEW  Result Date: 06/08/2022 CLINICAL DATA:  Shortness of breath. EXAM: PORTABLE CHEST 1 VIEW COMPARISON:  06/07/2022 FINDINGS: Low volume film. The cardio pericardial silhouette is enlarged. Bilateral IJ central lines evident with both tips position in the region of the innominate vein confluence. Drains overlie the right heart border and left lung base. Persistent basilar atelectasis with probable tiny right effusion. No edema or focal airspace consolidation. No evidence for pneumothorax. Telemetry leads overlie the chest. IMPRESSION: Low volume film with bibasilar atelectasis and probable tiny right effusion. Electronically Signed   By: Misty Stanley M.D.   On: 06/08/2022 08:22   DG Chest Port 1 View  Result Date: 06/07/2022 CLINICAL DATA:  Chest tube present status post CABG. EXAM: PORTABLE CHEST 1 VIEW COMPARISON:  Chest radiograph 1 day prior FINDINGS: Bilateral IJ central venous catheters are in place terminating in the mid SVC. Bilateral chest tubes are stable. Median sternotomy wires are stable. The heart is enlarged,  unchanged. The upper mediastinal contours are stable. Left worse than right basilar airspace disease and a probable small left pleural effusion are unchanged. There is no new or worsening focal airspace disease. There is no appreciable pneumothorax. The bones are stable. IMPRESSION: 1. Unchanged left worse than right basilar airspace opacity and probable small left pleural effusion. No new or worsening focal airspace disease. 2. Support devices as above. Electronically Signed   By: Valetta Mole M.D.   On: 06/07/2022 08:30   DG CHEST PORT 1 VIEW  Result Date: 06/06/2022 CLINICAL DATA:  [id number] Encounter for central line placement [id number] EXAM: PORTABLE CHEST 1 VIEW COMPARISON:  Same day 06/06/2022 FINDINGS: Prior median sternotomy and postsurgical changes of CABG. Right neck catheter tip overlies the mid SVC. New left neck approach catheter tip overlies the mid SVC, pointing rightward. Unchanged enlarged cardiac silhouette. Endotracheal tube and orogastric tubes have been removed. There is bibasilar airspace disease and a small left pleural effusion, unchanged. No evidence of pneumothorax. Bones are unchanged. IMPRESSION: New left neck approach catheter tip overlies the mid SVC, pointing rightward. Right neck catheter tip overlies the mid SVC. Unchanged bibasilar airspace disease and small left pleural effusion. Electronically Signed   By: Maurine Simmering M.D.   On: 06/06/2022 14:40    Cardiac Studies     Patient Profile     72 y.o. male  renal disease on peritoneal dialysis, coronary artery disease with history of PCI of the right coronary artery for evaluation of unstable angina.  Cardiac catheterization revealed severe in-stent restenosis in the mid right coronary artery as well as the distal RCA lesion, 75% proximal LAD and 50% circumflex.  Echocardiogram shows normal LV function and mild aortic insufficiency.   Assessment & Plan    NSTEMI-cardiac catheterization revealed severe in-stent restenosis in  the right coronary artery and also with proximal LAD disease.  Echocardiogram shows preserved LV function.  Status post CABG with LIMA-LAD, SVG-PDA on 06/05/2022.   -Continue aspirin, statin   Shock: currently on levophed at 11 mcg/min.  Echo 11/15 showed preserved LV function.   -CVP 18 on 11/21, was net negative 3.7L yesterday and CVP improved to 10 this AM.  Volume removal via CRRT, planning for net even today -Started on midodrine, wean pressors as tolerated  Acute hypoxic respiratory failure: Due to pulmonary edema following surgery.  PCCM following.  Now extubated. Volume removal via CRRT  2:1 AV block: currently with epicardial pacing, underlying rhythm is 2:1 AV  block in 40s, LBBB.  From review of telemetry, appears was having 2:1 AV block in 40s prior to his surgery.  -Amiodarone and metoprolol discontinued -Suspect will need PPM, seen by EP: will monitor for now, would prefer to hold off on PPM until back on PD    End-stage renal disease-peritoneal dialysis dependent. Nephrology following, currently on CRRT     For questions or updates, please contact [place] Please consult [email or web address] for contact info under   CRITICAL CARE TIME: I have spent a total of 33 minutes with patient reviewing hospital notes, telemetry, EKGs, labs and examining the patient as well as establishing an assessment and plan that was discussed with the patient.  > 50% of time was spent in direct patient care. The patient is critically ill with multi-organ system failure and requires high complexity decision making for assessment and support, frequent evaluation and titration of therapies, application of advanced monitoring technologies and extensive interpretation of multiple databases.    Signed, Donato Heinz, MD  06/08/2022, 9:49 AM

## 2022-06-08 NOTE — Progress Notes (Signed)
[address]       [place] [address]             [phone]      3 Days Post-Op  Procedure(s) (LRB): OFF PUMP CORONARY ARTERY BYPASS GRAFTING (CABG) X 2 BYPASSES USING LEFT INTERNAL MAMMARY ARTERY AND RIGHT LEG GREATER SAPHENOUS VEIN HARVESTED ENDOSCOPICALLY (N/A) TRANSESOPHAGEAL ECHOCARDIOGRAM (TEE) (N/A)   Total Length of Stay:  LOS: 9 days   SUBJECTIVE: Events of yesterday noted. He had need for increased support with ongoing CRRT removal of fluid then had some delerium on BIPAP and now better on [place] O2.   Vitals:   06/08/22 0715 06/08/22 0730  BP:    Pulse: 90 90  Resp: (!) 23 17  Temp:    SpO2: 100% 100%    Intake/Output      11/22 0701 11/23 0700 11/23 0701 11/24 0700   P.O. 250 120   I.V. (mL/kg) 599.4 (5.7) 9.4 (0.1)   Other 130 10   NG/GT     IV Piggyback     Total Intake(mL/kg) 979.4 (9.3) 139.4 (1.3)   Urine (mL/kg/hr)     Chest Tube 90 0   CRRT 4559 273   Total Output 4649 273   Net -3669.6 -133.6             prismasol BGK 4/2.5 500 mL/hr at 06/08/22 0330    prismasol BGK 4/2.5 300 mL/hr at 06/07/22 2350   sodium chloride     dexmedetomidine (PRECEDEX) IV infusion Stopped (06/08/22 0439)   dialysis solution 1.5% low-MG/low-CA     norepinephrine (LEVOPHED) Adult infusion 10 mcg/min (06/08/22 0800)   prismasol BGK 4/2.5 2,000 mL/hr at 06/08/22 0757    CBC    Component Value Date/Time   WBC 5.1 06/08/2022 0415   RBC 2.59 (L) 06/08/2022 0415   HGB 7.8 (L) 06/08/2022 0415   HGB 11.3 (L) 12/22/2021 1017   HCT 25.3 (L) 06/08/2022 0415   HCT 33.7 (L) 12/22/2021 1017   PLT 155 06/08/2022 0415   PLT 117 (L) 12/22/2021 1017   MCV 97.7 06/08/2022 0415   MCV 91 12/22/2021 1017   MCH 30.1 06/08/2022 0415   MCHC 30.8 06/08/2022 0415   RDW 16.3 (H) 06/08/2022 0415   RDW 13.7 12/22/2021 1017   LYMPHSABS 0.5 (L) 06/03/2022 0648   MONOABS 0.5 06/03/2022 0648   EOSABS 0.1 06/03/2022 0648   BASOSABS 0.0 06/03/2022 0648    CMP     Component Value Date/Time   NA 139 06/08/2022 0415   NA 136 12/22/2021 1017   K 4.5 06/08/2022 0415   CL 103 06/08/2022 0415   CO2 24 06/08/2022 0415   GLUCOSE 126 (H) 06/08/2022 0415   BUN 19 06/08/2022 0415   BUN 58 (H) 12/22/2021 1017   CREATININE 6.08 (H) 06/08/2022 0415   CREATININE 18.89 (H) 02/09/2020 1126   CALCIUM 8.6 (L) 06/08/2022 0415   PROT 5.9 (L) 06/03/2022 0648   ALBUMIN 2.5 (L) 06/08/2022 0415   AST 20 06/03/2022 0648   ALT 14 06/03/2022 0648   ALKPHOS 35 (L) 06/03/2022 0648   BILITOT 0.3 06/03/2022 0648   GFRNONAA 9 (L) 06/08/2022 0415   GFRNONAA 2 (L) 02/09/2020 1126   GFRAA <4 (L) 02/09/2020 1126   ABG    Component Value Date/Time   PHART 7.372 06/07/2022 2136   PCO2ART 45.6 06/07/2022 2136   PO2ART 156 (H) 06/07/2022 2136   HCO3 26.4 06/07/2022 2136  TCO2 28 06/07/2022 2136   ACIDBASEDEF 2.0 06/07/2022 0630   O2SAT 99 06/07/2022 2136   CBG (last 3)  Recent Labs    06/07/22 2328 06/08/22 0415 06/08/22 0738  GLUCAP 118* 118* 146*  EXAM Lungs: decreased at bases Card: RR Ext: warm, trace edema  ASSESSMENT: POD #3 SP off pump CABG x 2 Hemodynamically requiring levo support but better BP this am Continue midodrine and slowly wean Levo today CRRT as per renal but hopefully will lower hourly amount to assist in BP Will DC chest tubes today.  Still with need for pacing. Awaiting timing of PPM as per EP CXR clear   Coralie Common, MD '@DATE'$ @

## 2022-06-08 NOTE — Progress Notes (Signed)
     [address]       [place] [address]             [phone]       EVENING ROUNDS  Had a good day Weaning levo Stood at bedside and ISB to 2500cc Still on CVVH

## 2022-06-08 NOTE — Progress Notes (Addendum)
NAME:  [name] [name], MRN:  [id number], DOB:  [birth year], LOS: 40 ADMISSION DATE:  05/30/2022, CONSULTATION DATE: 06/05/2022 REFERRING MD: Dr. Kipp Brood, CHIEF COMPLAINT: S/p CABG  History of Present Illness:  71 year old male with end-stage renal disease on peritoneal dialysis, diabetes type 2, hypertension, hyperlipidemia, coronary artery disease, OSA and COPD, who was admitted with unstable angina, on work-up noted to have multivessel coronary artery disease underwent CABG x2, postop requiring vasopressor support and remain on full mechanical ventilatory support. Received 2 units of PRBCs in OR PCCM was consulted for help evaluation medical management  Pertinent  Medical History   Past Medical History:  Diagnosis Date   Anemia    Arthritis    Asthma    Bell palsy    CAD (coronary artery disease)    a. 2014 MV: abnl w/ infap ischemia; b. 03/2013 Cath: aneurysmal bleb in the LAD w/ otw nonobs dzs-->Med Rx.   Chronic back pain    Chronic knee pain    a. 09/2015 s/p R TKA.   Chronic pain    Chronic shoulder pain    Chronic sinusitis    COPD (chronic obstructive pulmonary disease) ([place])    Diabetes mellitus without complication ([place])    type II    ESRD on peritoneal dialysis ([place])    on peritoneal dialysis, [facility]   Essential hypertension    GERD (gastroesophageal reflux disease)    Gout    Gout    Hepatomegaly    noted on noncontrast CT 2015   History of hiatal hernia    Hyperlipidemia    Lateral meniscus tear    Obesity    Truncal   Obstructive sleep apnea    does not use cpap    On home oxygen therapy    uses 2l when is going somewhere per patient    PUD (peptic ulcer disease)    remote, reports f/u EGD about 8 years ago unremarkable    Reactive airway disease    related to exposure to chemical during 9/11   Sinusitis    Vitamin D deficiency      Significant Hospital Events: Including procedures, antibiotic start and stop dates in addition to other  pertinent events     Interim History / Subjective:  Patient required BiPAP overnight, became delirious Now on nasal cannula oxygen Continue to require vasopressor support Still on CRRT, tolerating well so far   Objective   Blood pressure 100/79, pulse 90, temperature 98.4 F (36.9 C), temperature source Axillary, resp. rate 18, height '5\' 4"'$  (1.626 m), weight 105.1 kg, SpO2 100 %. CVP:  [9 mmHg-18 mmHg] 10 mmHg  Vent Mode: BIPAP FiO2 (%):  [40 %] 40 % Set Rate:  [15 bmp] 15 bmp   Intake/Output Summary (Last 24 hours) at 06/08/2022 1024 Last data filed at 06/08/2022 1000 Gross per 24 hour  Intake 1114.05 ml  Output 4659 ml  Net -3544.95 ml   Filed Weights   06/06/22 0315 06/07/22 0500 06/08/22 0500  Weight: 113 kg 109 kg 105.1 kg    Examination: Physical exam: General: Acute on chronically ill-appearing morbidly obese male, lying on the bed HEENT: [place]/AT, eyes anicteric.  moist mucus membranes Neuro: Alert, awake following commands Chest: Coarse breath sounds, no wheezes or rhonchi Heart: Pacer wire in place, currently in paced rhythm, no murmurs or gallops Abdomen: Soft, nontender, nondistended, bowel sounds present.  Peritoneal dialysis catheter in place Skin: No rash  Resolved Hospital Problem list     Assessment &  Plan:  Acute NSTEMI Coronary artery disease s/p 2-vessel CABG Cardiogenic shock Mobitz type II heart block Acute hypoxic respiratory failure due to pulmonary edema, currently on nasal cannula oxygen Acute delirium, improved Hypertension Hyperlipidemia Diabetes type 2 End-stage renal disease on peritoneal dialysis.  Currently on CRRT Hyperkalemia, resolved Acute blood loss anemia, perioperative Obesity hypoventilation syndrome/OSA on BiPAP Morbid obesity   Continue aspirin and statin Chest tubes were discontinued Continue to require vasopressor support with Levophed, titrate with map of 65 Increase midodrine to 15 mg 3 times daily Monitor intake  and output Overnight he was agitated and delirious on BiPAP Continue to titrate Precedex with RASS goal 0/-1 Now BiPAP is off, currently on nasal cannula oxygen Monitor H&H and transfuse per TCTS  Received 2 units PRBCs 2 days ago Chest tube discontinued Continue pain control with tramadol, oxycodone and IV morphine Blood sugar remain controlled Continue insulin with CBG goal 140-180 Continue CRRT for now, transition back to PD once stable and off pressors Patient is in paced rhythm, EP is following, patient may require PPM prior to discharge  Best Practice (right click and "Reselect all SmartList Selections" daily)   Diet/type: Regular consistency DVT prophylaxis: Subcu heparin GI prophylaxis: Protonix Lines: Central line and yes and it is still needed Foley: Discontinue Code Status:  full code Last date of multidisciplinary goals of care discussion [Per primary team]  Labs   CBC: Recent Labs  Lab 06/02/22 0312 06/03/22 0648 06/04/22 0557 06/06/22 0303 06/06/22 0938 06/06/22 1235 06/06/22 1346 06/06/22 1651 06/07/22 0344 06/07/22 0612 06/07/22 0630 06/07/22 2136 06/08/22 0415  WBC 4.9 5.1   < > 7.3  --  8.7  --  10.4 9.3  --   --   --  5.1  NEUTROABS 3.8 4.0  --   --   --   --   --   --   --   --   --   --   --   HGB 8.5* 8.3*   < > 8.1*   < > 7.5*   < > 7.9* 8.3* 5.1* 7.8* 7.1* 7.8*  HCT 27.2* 25.0*   < > 24.7*   < > 24.0*   < > 24.7* 25.0* 15.0* 23.0* 21.0* 25.3*  MCV 95.4 94.0   < > 93.9  --  96.8  --  95.0 94.3  --   --   --  97.7  PLT 106* 90*   < > 104*  --  108*  --  133* 134*  --   --   --  155   < > = values in this interval not displayed.    Basic Metabolic Panel: Recent Labs  Lab 06/05/22 0539 06/05/22 0818 06/05/22 1857 06/06/22 0006 06/06/22 0303 06/06/22 7858 06/06/22 1651 06/07/22 0344 06/07/22 0612 06/07/22 0630 06/07/22 1613 06/07/22 2136 06/08/22 0415  NA 138   < > 137   < > 135   < > 136  135 137 142 134* 137 136 139  K 4.3   < >  5.3*   < > 6.1*   < > 5.2*  5.2* 4.2 2.7* 4.1 4.5 4.4 4.5  CL 98   < > 100  --  100  --  101  101 102  --   --  101  --  103  CO2 27  --  22  --  21*  --  22  22 20*  --   --  23  --  24  GLUCOSE 143*   < > 148*  --  132*  --  129*  130* 134*  --   --  133*  --  126*  BUN 52*   < > 48*  --  49*  --  47*  47* 30*  --   --  23  --  19  CREATININE 20.83*   < > 19.52*  --  19.02*  --  16.63*  16.95* 11.40*  --   --  8.11*  --  6.08*  CALCIUM 9.1  --  7.9*  --  8.0*  --  7.7*  7.6* 7.8*  --   --  8.3*  --  8.6*  MG  --   --  1.9  --  2.0  --  2.0 2.0  --   --   --   --  2.4  PHOS 3.6  --   --   --   --   --  4.6 3.7  --   --  3.6  --  2.8   < > = values in this interval not displayed.   GFR: Estimated Creatinine Clearance: 12.1 mL/min (A) (by C-G formula based on SCr of 6.08 mg/dL (H)). Recent Labs  Lab 06/06/22 1235 06/06/22 1651 06/07/22 0344 06/08/22 0415  WBC 8.7 10.4 9.3 5.1    Liver Function Tests: Recent Labs  Lab 06/02/22 0312 06/03/22 0648 06/05/22 0539 06/06/22 1651 06/07/22 0344 06/07/22 1613 06/08/22 0415  AST 24 20  --   --   --   --   --   ALT 14 14  --   --   --   --   --   ALKPHOS 42 35*  --   --   --   --   --   BILITOT 0.4 0.3  --   --   --   --   --   PROT 5.9* 5.9*  --   --   --   --   --   ALBUMIN 2.7* 2.7* 2.7* 2.7* 2.7* 2.7* 2.5*   No results for input(s): "LIPASE", "AMYLASE" in the last 168 hours. No results for input(s): "AMMONIA" in the last 168 hours.  ABG    Component Value Date/Time   PHART 7.372 06/07/2022 2136   PCO2ART 45.6 06/07/2022 2136   PO2ART 156 (H) 06/07/2022 2136   HCO3 26.4 06/07/2022 2136   TCO2 28 06/07/2022 2136   ACIDBASEDEF 2.0 06/07/2022 0630   O2SAT 99 06/07/2022 2136     Coagulation Profile: Recent Labs  Lab 06/04/22 2118 06/05/22 1318  INR 1.0 1.2    Cardiac Enzymes: No results for input(s): "CKTOTAL", "CKMB", "CKMBINDEX", "TROPONINI" in the last 168 hours.  HbA1C: Hemoglobin A1C  Date/Time Value  Ref Range Status  12/17/2021 12:00 AM 6.3  Final  12/19/2016 12:00 AM 6.4  Final   HbA1c, POC (controlled diabetic range)  Date/Time Value Ref Range Status  06/16/2021 10:50 AM 6.2 0.0 - 7.0 % Final  06/16/2020 10:03 AM 6.1 0.0 - 7.0 % Final   Hgb A1c MFr Bld  Date/Time Value Ref Range Status  05/30/2022 11:48 AM 5.9 (H) 4.8 - 5.6 % Final    Comment:    (NOTE) Pre diabetes:          5.7%-6.4%  Diabetes:              >6.4%  Glycemic control for   <7.0% adults with diabetes   07/22/2021 02:16 PM 6.6 (H)  4.8 - 5.6 % Final    Comment:    (NOTE) Pre diabetes:          5.7%-6.4%  Diabetes:              >6.4%  Glycemic control for   <7.0% adults with diabetes     CBG: Recent Labs  Lab 06/07/22 1625 06/07/22 1922 06/07/22 2328 06/08/22 0415 06/08/22 0738  GLUCAP 113* 112* 118* 118* 146*   Critical care time:     Total critical care time: 32 minutes  Performed by: Zuehl care time was exclusive of separately billable procedures and treating other patients.   Critical care was necessary to treat or prevent imminent or life-threatening deterioration.   Critical care was time spent personally by me on the following activities: development of treatment plan with patient and/or surrogate as well as nursing, discussions with consultants, evaluation of patient's response to treatment, examination of patient, obtaining history from patient or surrogate, ordering and performing treatments and interventions, ordering and review of laboratory studies, ordering and review of radiographic studies, pulse oximetry and re-evaluation of patient's condition.   Jacky Kindle, MD [facility] See Amion for pager If no response to pager, please call [phone] until 7pm After 7pm, Please call E-link [phone]

## 2022-06-08 NOTE — Progress Notes (Addendum)
[facility] NEPHROLOGY PROGRESS NOTE  Assessment/ Plan: Pt is a 71 y.o. yo male   with past medical history significant for OSA, COPD, type II DM, CAD, ESRD on PD, anemia who was presented with chest pain, and NSTEMI, seen as a consultation for the management of ESRD.   Dialysis Orders from previous admission in 01/2022.: [facility] therapies.  CCPD, 5 exchanges 3 liter fill volumes, 10 min fill time, 1 hour 20 minutes dwell, 20 minute drain.  Last fill Icodextran.  EDW 103 kg. Uses combination of 1.5% and 2.5% depending upon BP and weight    #NSTEMI/unstable angina: Cardiac cath on 11/14 with severe in in-stent restenosis in the right coronary artery and proximal LAD disease.  Underwent CABG on 11/20. Surgery following   # ESRD on PD: CCPD at night, 5 exchanges, 2-2.5 L fill volume, mix of 1.5 and 2.5% solution.  He does have left upper extremity AV fistula but likely not fully functional. Patient was complaining of abdominal distention and difficulty with 2.5 L of fill volume on 11/17.  We lowered fill volume to 2 L and he tolerated well.  Held RRT on 11/20 but then started CRRT on 11/21.  Temporary dialysis catheter placed by CCM.  Appreciate help.  Continue CRRT for now.  Transition back to PD as tolerated likely in the next few days.  #Hyperkalemia: Improved with CRRT    # Hypertension/Shock: now with hypotension related surgery/shock. Pressors per primary.  Limit ultrafiltration so pressor requirement can be decreased.   # Anemia of ESRD: Hemoglobin around 8-9.  Iron saturation 27 with high ferritin.  Received Aranesp on 11/17. Received transfusion with surgery  #CKD-MBD: Calcium and phosphorus level acceptable.  Currently on sevelamer and calcitriol.  Subjective: Some confusion overnight required sedating medications and thus increased pressor requirement.  More alert and oriented this morning.  States he feels well.  -3.5 L over the past 24 hours.  On  norepinephrine 10  Objective Vital signs in last 24 hours: Vitals:   06/08/22 0600 06/08/22 0700 06/08/22 0715 06/08/22 0730  BP:      Pulse:  88 90 90  Resp: 16 18 (!) 23 17  Temp:      TempSrc:      SpO2: 100% 100% 100% 100%  Weight:      Height:       Weight change: -3.9 kg  Intake/Output Summary (Last 24 hours) at 06/08/2022 0816 Last data filed at 06/08/2022 0800 Gross per 24 hour  Intake 1052.67 ml  Output 4712 ml  Net -3659.33 ml       Labs: RENAL PANEL Recent Labs    07/24/21 0254 07/25/21 0201 07/29/21 1410 08/14/21 2152 01/18/22 1149 01/18/22 1436 05/30/22 1148 05/30/22 1507 06/01/22 0212 06/02/22 0312 06/03/22 0648 06/05/22 0539 06/05/22 0818 06/05/22 0823 06/05/22 1013 06/05/22 1148 06/05/22 1224 06/05/22 1321 06/05/22 1857 06/06/22 0006 06/06/22 0303 06/06/22 0938 06/06/22 1116 06/06/22 1346 06/06/22 1651 06/07/22 0344 06/07/22 0612 06/07/22 0630 06/07/22 1613 06/07/22 2136 06/08/22 0415  NA 130*   < > 134*   < > 135   < >  --    < > 136 134* 137 138 138   < > 136   < > 139   < > 137   < > 135 139 136 139 136  135 137 142 134* 137 136 139  K 3.3*   < > 4.0   < > 3.9   < >  --    < >  3.9 3.9 4.1 4.3 4.8   < > 4.9   < > 3.9   < > 5.3*   < > 6.1* 4.6 3.9 4.4 5.2*  5.2* 4.2 2.7* 4.1 4.5 4.4 4.5  CL 89*   < > 90*   < > 94*   < >  --    < > 96* 94* 98 98 98  --  100  --  103  --  100  --  100  --   --   --  101  101 102  --   --  101  --  103  CO2 26   < > 27   < > 29   < >  --    < > '26 26 25 27  '$ --   --   --   --   --   --  22  --  21*  --   --   --  22  22 20*  --   --  23  --  24  GLUCOSE 97   < > 124*   < > 116*   < >  --    < > 121* 132* 127* 143* 128*  --  148*  --  106*  --  148*  --  132*  --   --   --  129*  130* 134*  --   --  133*  --  126*  BUN 73*   < > 67*   < > 40*   < >  --    < > 50* 47* 50* 52* 49*  --  44*  --  43*  --  48*  --  49*  --   --   --  47*  47* 30*  --   --  23  --  19  CREATININE 20.21*   < > 20.22*   <  > 17.26*   < >  --    < > 19.46* 19.06* 20.40* 20.83* >18.00*  --  >18.00*  --  >18.00*  --  19.52*  --  19.02*  --   --   --  16.63*  16.95* 11.40*  --   --  8.11*  --  6.08*  CALCIUM 9.0   < > 9.4   < > 9.8   < >  --    < > 9.3 9.0 9.2 9.1  --   --   --   --   --   --  7.9*  --  8.0*  --   --   --  7.7*  7.6* 7.8*  --   --  8.3*  --  8.6*  MG  --   --  2.4  --   --   --  2.1  --  2.0 2.0 2.1  --   --   --   --   --   --   --  1.9  --  2.0  --   --   --  2.0 2.0  --   --   --   --  2.4  PHOS 8.8*  --   --   --   --   --   --   --  3.3 3.3 4.0 3.6  --   --   --   --   --   --   --   --   --   --   --   --  4.6 3.7  --   --  3.6  --  2.8  ALBUMIN 3.0*  2.8*  --   --    < > 3.2*  --  3.0*  --  2.6* 2.7* 2.7* 2.7*  --   --   --   --   --   --   --   --   --   --   --   --  2.7* 2.7*  --   --  2.7*  --  2.5*   < > = values in this interval not displayed.     Liver Function Tests: Recent Labs  Lab 06/02/22 0312 06/03/22 0648 06/05/22 0539 06/07/22 0344 06/07/22 1613 06/08/22 0415  AST 24 20  --   --   --   --   ALT 14 14  --   --   --   --   ALKPHOS 42 35*  --   --   --   --   BILITOT 0.4 0.3  --   --   --   --   PROT 5.9* 5.9*  --   --   --   --   ALBUMIN 2.7* 2.7*   < > 2.7* 2.7* 2.5*   < > = values in this interval not displayed.   No results for input(s): "LIPASE", "AMYLASE" in the last 168 hours. No results for input(s): "AMMONIA" in the last 168 hours. CBC: Recent Labs    06/02/22 0312 06/03/22 0648 06/07/22 0344 06/07/22 0612 06/07/22 0630 06/07/22 2136 06/08/22 0415  HGB 8.5*   < > 8.3* 5.1* 7.8* 7.1* 7.8*  MCV 95.4   < > 94.3  --   --   --  97.7  FERRITIN 1,105*  --   --   --   --   --   --   TIBC 293  --   --   --   --   --   --   IRON 79  --   --   --   --   --   --    < > = values in this interval not displayed.    Cardiac Enzymes: No results for input(s): "CKTOTAL", "CKMB", "CKMBINDEX", "TROPONINI" in the last 168 hours. CBG: Recent Labs  Lab 06/07/22 1625  06/07/22 1922 06/07/22 2328 06/08/22 0415 06/08/22 0738  GLUCAP 113* 112* 118* 118* 146*    Iron Studies: No results for input(s): "IRON", "TIBC", "TRANSFERRIN", "FERRITIN" in the last 72 hours. Studies/Results: DG Chest Port 1 View  Result Date: 06/07/2022 CLINICAL DATA:  Chest tube present status post CABG. EXAM: PORTABLE CHEST 1 VIEW COMPARISON:  Chest radiograph 1 day prior FINDINGS: Bilateral IJ central venous catheters are in place terminating in the mid SVC. Bilateral chest tubes are stable. Median sternotomy wires are stable. The heart is enlarged, unchanged. The upper mediastinal contours are stable. Left worse than right basilar airspace disease and a probable small left pleural effusion are unchanged. There is no new or worsening focal airspace disease. There is no appreciable pneumothorax. The bones are stable. IMPRESSION: 1. Unchanged left worse than right basilar airspace opacity and probable small left pleural effusion. No new or worsening focal airspace disease. 2. Support devices as above. Electronically Signed   By: Valetta Mole M.D.   On: 06/07/2022 08:30   DG CHEST PORT 1 VIEW  Result Date: 06/06/2022 CLINICAL DATA:  [id number] Encounter for central line placement [id number] EXAM: PORTABLE CHEST 1 VIEW COMPARISON:  Same day 06/06/2022 FINDINGS: Prior median sternotomy and postsurgical changes of CABG. Right neck catheter tip overlies the mid SVC. New left neck approach catheter tip overlies the mid SVC, pointing rightward. Unchanged enlarged cardiac silhouette. Endotracheal tube and orogastric tubes have been removed. There is bibasilar airspace disease and a small left pleural effusion, unchanged. No evidence of pneumothorax. Bones are unchanged. IMPRESSION: New left neck approach catheter tip overlies the mid SVC, pointing rightward. Right neck catheter tip overlies the mid SVC. Unchanged bibasilar airspace disease and small left pleural effusion. Electronically Signed   By: Maurine Simmering M.D.   On: 06/06/2022 14:40    Medications: Infusions:   prismasol BGK 4/2.5 500 mL/hr at 06/08/22 0330    prismasol BGK 4/2.5 300 mL/hr at 06/07/22 2350   sodium chloride     dexmedetomidine (PRECEDEX) IV infusion Stopped (06/08/22 0439)   dialysis solution 1.5% low-MG/low-CA     norepinephrine (LEVOPHED) Adult infusion 10 mcg/min (06/08/22 0800)   prismasol BGK 4/2.5 2,000 mL/hr at 06/08/22 0757    Scheduled Medications:  acetaminophen  1,000 mg Oral Q6H   Or   acetaminophen (TYLENOL) oral liquid 160 mg/5 mL  1,000 mg Per Tube Q6H   aspirin EC  325 mg Oral Daily   Or   aspirin  324 mg Per Tube Daily   bisacodyl  10 mg Oral Daily   Or   bisacodyl  10 mg Rectal Daily   Chlorhexidine Gluconate Cloth  6 each Topical Daily   docusate sodium  200 mg Oral Daily   gentamicin cream  1 Application Topical Daily   heparin injection (subcutaneous)  5,000 Units Subcutaneous Q8H   insulin aspart  0-24 Units Subcutaneous Q4H   insulin detemir  10 Units Subcutaneous Daily   midodrine  10 mg Oral TID WC   pantoprazole  40 mg Oral Daily   simvastatin  20 mg Oral q morning   sodium chloride flush  3 mL Intravenous Q12H    have reviewed scheduled and prn medications.  Physical Exam: General:lying in bed, awake and in nad Heart:normal rate, 1+ edema in BLE Lungs: bilateral chest rise, ventilated Abdomen:soft, Non-tender, moderate distention Extremities: warm and well perfused Dialysis Access: PD catheter site clean, left upper extremity AV fistula has good thrill and bruit but small and  deep  Shaune Pollack Javarus Dorner 06/08/2022,8:16 AM  LOS: 9 days

## 2022-06-09 ENCOUNTER — Inpatient Hospital Stay (HOSPITAL_COMMUNITY): Payer: Medicare Other

## 2022-06-09 DIAGNOSIS — N186 End stage renal disease: Secondary | ICD-10-CM | POA: Diagnosis not present

## 2022-06-09 DIAGNOSIS — R579 Shock, unspecified: Secondary | ICD-10-CM | POA: Diagnosis not present

## 2022-06-09 DIAGNOSIS — I4430 Unspecified atrioventricular block: Secondary | ICD-10-CM | POA: Diagnosis not present

## 2022-06-09 DIAGNOSIS — I214 Non-ST elevation (NSTEMI) myocardial infarction: Secondary | ICD-10-CM | POA: Diagnosis not present

## 2022-06-09 DIAGNOSIS — J9601 Acute respiratory failure with hypoxia: Secondary | ICD-10-CM | POA: Diagnosis not present

## 2022-06-09 DIAGNOSIS — Z951 Presence of aortocoronary bypass graft: Secondary | ICD-10-CM | POA: Diagnosis not present

## 2022-06-09 LAB — RENAL FUNCTION PANEL
Albumin: 2.2 g/dL — ABNORMAL LOW (ref 3.5–5.0)
Albumin: 2.3 g/dL — ABNORMAL LOW (ref 3.5–5.0)
Anion gap: 12 (ref 5–15)
Anion gap: 12 (ref 5–15)
BUN: 15 mg/dL (ref 8–23)
BUN: 15 mg/dL (ref 8–23)
CO2: 26 mmol/L (ref 22–32)
CO2: 24 mmol/L (ref 22–32)
Calcium: 8.5 mg/dL — ABNORMAL LOW (ref 8.9–10.3)
Calcium: 8 mg/dL — ABNORMAL LOW (ref 8.9–10.3)
Chloride: 102 mmol/L (ref 98–111)
Chloride: 103 mmol/L (ref 98–111)
Creatinine, Ser: 4.3 mg/dL — ABNORMAL HIGH (ref 0.61–1.24)
Creatinine, Ser: 3.67 mg/dL — ABNORMAL HIGH (ref 0.61–1.24)
GFR, Estimated: 14 mL/min — ABNORMAL LOW (ref >60)
GFR, Estimated: 17 mL/min — ABNORMAL LOW (ref >60)
Glucose, Bld: 105 mg/dL — ABNORMAL HIGH (ref 70–99)
Glucose, Bld: 109 mg/dL — ABNORMAL HIGH (ref 70–99)
Phosphorus: 2.2 mg/dL — ABNORMAL LOW (ref 2.5–4.6)
Phosphorus: 4.3 mg/dL (ref 2.5–4.6)
Potassium: 4.1 mmol/L (ref 3.5–5.1)
Potassium: 4.1 mmol/L (ref 3.5–5.1)
Sodium: 140 mmol/L (ref 135–145)
Sodium: 139 mmol/L (ref 135–145)

## 2022-06-09 LAB — PREPARE RBC (CROSSMATCH)

## 2022-06-09 LAB — CBC
HCT: 23.7 % — ABNORMAL LOW (ref 39.0–52.0)
HCT: 25.8 % — ABNORMAL LOW (ref 39.0–52.0)
Hemoglobin: 7.2 g/dL — ABNORMAL LOW (ref 13.0–17.0)
Hemoglobin: 8.3 g/dL — ABNORMAL LOW (ref 13.0–17.0)
MCH: 30 pg (ref 26.0–34.0)
MCH: 30.7 pg (ref 26.0–34.0)
MCHC: 30.4 g/dL (ref 30.0–36.0)
MCHC: 32.2 g/dL (ref 30.0–36.0)
MCV: 98.8 fL (ref 80.0–100.0)
MCV: 95.6 fL (ref 80.0–100.0)
Platelets: 163 10*3/uL (ref 150–400)
Platelets: 153 10*3/uL (ref 150–400)
RBC: 2.4 MIL/uL — ABNORMAL LOW (ref 4.22–5.81)
RBC: 2.7 MIL/uL — ABNORMAL LOW (ref 4.22–5.81)
RDW: 16.2 % — ABNORMAL HIGH (ref 11.5–15.5)
RDW: 16 % — ABNORMAL HIGH (ref 11.5–15.5)
WBC: 5.4 10*3/uL (ref 4.0–10.5)
WBC: 5.6 10*3/uL (ref 4.0–10.5)
nRBC: 1.3 % — ABNORMAL HIGH (ref 0.0–0.2)
nRBC: 2.5 % — ABNORMAL HIGH (ref 0.0–0.2)

## 2022-06-09 LAB — GLUCOSE, CAPILLARY
Glucose-Capillary: 110 mg/dL — ABNORMAL HIGH (ref 70–99)
Glucose-Capillary: 120 mg/dL — ABNORMAL HIGH (ref 70–99)
Glucose-Capillary: 97 mg/dL (ref 70–99)
Glucose-Capillary: 97 mg/dL (ref 70–99)

## 2022-06-09 LAB — MAGNESIUM: Magnesium: 2.4 mg/dL (ref 1.7–2.4)

## 2022-06-09 MED ORDER — ATORVASTATIN CALCIUM 80 MG PO TABS
80.0000 mg | ORAL_TABLET | Freq: Every day | ORAL | Status: DC
  Administered 2022-06-09 – 2022-06-19 (×11): 80 mg via ORAL
  Filled 2022-06-09 (×11): qty 1

## 2022-06-09 MED ORDER — POLYETHYLENE GLYCOL 3350 17 G PO PACK
17.0000 g | PACK | Freq: Every day | ORAL | Status: DC
  Administered 2022-06-09 – 2022-06-17 (×3): 17 g via ORAL
  Filled 2022-06-09 (×6): qty 1

## 2022-06-09 MED ORDER — DOCUSATE SODIUM 100 MG PO CAPS
200.0000 mg | ORAL_CAPSULE | Freq: Two times a day (BID) | ORAL | Status: DC
  Administered 2022-06-09 – 2022-06-18 (×15): 200 mg via ORAL
  Filled 2022-06-09 (×18): qty 2

## 2022-06-09 MED ORDER — SENNA 8.6 MG PO TABS
2.0000 | ORAL_TABLET | Freq: Every day | ORAL | Status: DC
  Administered 2022-06-09 – 2022-06-18 (×6): 17.2 mg via ORAL
  Filled 2022-06-09 (×7): qty 2

## 2022-06-09 MED ORDER — QUETIAPINE FUMARATE 50 MG PO TABS
50.0000 mg | ORAL_TABLET | Freq: Every day | ORAL | Status: DC
  Administered 2022-06-09: 50 mg via ORAL
  Filled 2022-06-09: qty 1

## 2022-06-09 MED ORDER — SODIUM PHOSPHATES 45 MMOLE/15ML IV SOLN
30.0000 mmol | Freq: Once | INTRAVENOUS | Status: AC
  Administered 2022-06-09: 30 mmol via INTRAVENOUS
  Filled 2022-06-09: qty 10

## 2022-06-09 MED ORDER — MELATONIN 3 MG PO TABS
3.0000 mg | ORAL_TABLET | Freq: Every day | ORAL | Status: DC
  Administered 2022-06-09 – 2022-06-18 (×10): 3 mg via ORAL
  Filled 2022-06-09 (×10): qty 1

## 2022-06-09 MED ORDER — DARBEPOETIN ALFA 150 MCG/0.3ML IJ SOSY
150.0000 ug | PREFILLED_SYRINGE | INTRAMUSCULAR | Status: DC
  Administered 2022-06-09: 150 ug via SUBCUTANEOUS
  Filled 2022-06-09: qty 0.3

## 2022-06-09 NOTE — Progress Notes (Signed)
     [address]       [place] [address]             [phone]       EVENING ROUNDS  Received unit of PRBCs today Now down on Levo, hopefully off by morning Continues with CRRT

## 2022-06-09 NOTE — Progress Notes (Addendum)
[address]       [place] [address]             [phone]      4 Days Post-Op Procedure(s) (LRB): OFF PUMP CORONARY ARTERY BYPASS GRAFTING (CABG) X 2 BYPASSES USING LEFT INTERNAL MAMMARY ARTERY AND RIGHT LEG GREATER SAPHENOUS VEIN HARVESTED ENDOSCOPICALLY (N/A) TRANSESOPHAGEAL ECHOCARDIOGRAM (TEE) (N/A) Subjective: Patient complains of some chest soreness.  Objective: Vital signs in last 24 hours: Temp:  [98.4 F (36.9 C)-98.7 F (37.1 C)] 98.7 F (37.1 C) (11/24 0737) Pulse Rate:  [88-97] 89 (11/24 0815) Cardiac Rhythm: Ventricular paced (11/24 0800) Resp:  [13-33] 26 (11/24 0830) BP: (79-115)/(57-88) 105/69 (11/24 0800) SpO2:  [90 %-100 %] 100 % (11/24 0815) Arterial Line BP: (70-139)/(46-86) 80/63 (11/24 0830) FiO2 (%):  [40 %] 40 % (11/24 0400) Weight:  [104.2 kg] 104.2 kg (11/24 0500)  Hemodynamic parameters for last 24 hours: CVP:  [6 mmHg-18 mmHg] 12 mmHg  Intake/Output from previous day: 11/23 0701 - 11/24 0700 In: 1224.1 [P.O.:620; I.V.:484.1] Out: 1742 [Urine:75] Intake/Output this shift: Total I/O In: 18.8 [I.V.:18.8] Out: -   General appearance: alert, cooperative, and no distress Neurologic: intact Heart: regular rate and rhythm, S1, S2 normal, no murmur, click, rub or gallop Lungs: Diminished breath sounds Abdomen: soft, non-tender; bowel sounds normal; no masses,  no organomegaly Extremities: edema trace Wound: Clean, dry, intact  Lab Results: Recent Labs    06/08/22 0415 06/09/22 0405  WBC 5.1 5.4  HGB 7.8* 7.2*  HCT 25.3* 23.7*  PLT 155 163   BMET:  Recent Labs    06/08/22 1510 06/09/22 0405  NA 138 140  K 4.3 4.1  CL 102 102  CO2 24 26  GLUCOSE 129* 105*  BUN 18 15  CREATININE 5.10* 4.30*  CALCIUM 8.5* 8.5*    PT/INR: No results for input(s): "LABPROT", "INR" in the last 72 hours. ABG    Component Value Date/Time   PHART 7.372 06/07/2022 2136   HCO3 26.4 06/07/2022 2136   TCO2 28 06/07/2022 2136    ACIDBASEDEF 2.0 06/07/2022 0630   O2SAT 99 06/07/2022 2136   CBG (last 3)  Recent Labs    06/08/22 1518 06/08/22 2110 06/09/22 0735  GLUCAP 126* 91 110*    Assessment/Plan: S/P Procedure(s) (LRB): OFF PUMP CORONARY ARTERY BYPASS GRAFTING (CABG) X 2 BYPASSES USING LEFT INTERNAL MAMMARY ARTERY AND RIGHT LEG GREATER SAPHENOUS VEIN HARVESTED ENDOSCOPICALLY (N/A) TRANSESOPHAGEAL ECHOCARDIOGRAM (TEE) (N/A)  Neuro: Patient has some delirium at night but has improved, likely sundowning. On precedex at night to relieve agitation with BIPAP. Some expected chest soreness but pain is otherwise controlled.  CV: Hx of second degree Mobitz heart block. V paced at 90. EP recommending PPM before discharge. Soft BP, SBP 105 this AM. On 55mg of Levophed, midodrine '15mg'$  TID. Continue to wean Levo. Plan to start Plavix for NSTEMI once EPW removed.   Pulm: Stable CXR, no pneumothorax, unchanged bibasilar atelectasis. Saturating 100% on 2L of [place]. Uses CPAP at night at home, currently using BIPAP at night. Encourage IS.   GI: On renal diet. Not a great appetite yet. No nausea. -BM, passing gas.  Endo: T2DM on SSI, okay control. CBGs 126/91/110.   Renal: On PD preop, held postop. On CRRT, 1667 out/24 hours. Cr 4.3. K 4.1. Followed by nephrology.  ID: Afebrile, WBC 5.4.  Expected postop ABLA: Trending down, H/H 7.2/23.7. Will discuss with surgeon transfusion. Continue to closely monitor.   Dispo:  Wean Levo today. Continue ICU care.    LOS: 10 days    Magdalene River, PA-C  06/09/2022

## 2022-06-09 NOTE — Progress Notes (Signed)
NAME:  [name] [name], MRN:  [id number], DOB:  January 11, 1951, LOS: 84 ADMISSION DATE:  05/30/2022, CONSULTATION DATE: 06/05/2022 REFERRING MD: Dr. Kipp Brood, CHIEF COMPLAINT: S/p CABG  History of Present Illness:  71 year old male with end-stage renal disease on peritoneal dialysis, diabetes type 2, hypertension, hyperlipidemia, coronary artery disease, OSA and COPD, who was admitted with unstable angina, on work-up noted to have multivessel coronary artery disease underwent CABG x2, postop requiring vasopressor support and remain on full mechanical ventilatory support. Received 2 units of PRBCs in OR PCCM was consulted for help evaluation medical management  Pertinent  Medical History   Past Medical History:  Diagnosis Date   Anemia    Arthritis    Asthma    Bell palsy    CAD (coronary artery disease)    a. 2014 MV: abnl w/ infap ischemia; b. 03/2013 Cath: aneurysmal bleb in the LAD w/ otw nonobs dzs-->Med Rx.   Chronic back pain    Chronic knee pain    a. 09/2015 s/p R TKA.   Chronic pain    Chronic shoulder pain    Chronic sinusitis    COPD (chronic obstructive pulmonary disease) ([place])    Diabetes mellitus without complication ([place])    type II    ESRD on peritoneal dialysis ([place])    on peritoneal dialysis, [facility]   Essential hypertension    GERD (gastroesophageal reflux disease)    Gout    Gout    Hepatomegaly    noted on noncontrast CT 2015   History of hiatal hernia    Hyperlipidemia    Lateral meniscus tear    Obesity    Truncal   Obstructive sleep apnea    does not use cpap    On home oxygen therapy    uses 2l when is going somewhere per patient    PUD (peptic ulcer disease)    remote, reports f/u EGD about 8 years ago unremarkable    Reactive airway disease    related to exposure to chemical during 9/11   Sinusitis    Vitamin D deficiency      Significant Hospital Events: Including procedures, antibiotic start and stop dates in addition to other  pertinent events     Interim History / Subjective:  Patient was placed on BiPAP overnight, required low-dose Precedex Now on nasal cannula oxygen Continue to require vasopressor support Still on CRRT, tolerating well so far   Objective   Blood pressure 105/69, pulse 89, temperature 98.7 F (37.1 C), temperature source Oral, resp. rate 19, height '5\' 4"'$  (1.626 m), weight 104.2 kg, SpO2 97 %. CVP:  [6 mmHg-18 mmHg] 10 mmHg  Vent Mode: BIPAP FiO2 (%):  [40 %] 40 % Set Rate:  [15 bmp] 15 bmp PEEP:  [8 cmH20] 8 cmH20   Intake/Output Summary (Last 24 hours) at 06/09/2022 1016 Last data filed at 06/09/2022 0900 Gross per 24 hour  Intake 923.55 ml  Output 1063 ml  Net -139.45 ml   Filed Weights   06/07/22 0500 06/08/22 0500 06/09/22 0500  Weight: 109 kg 105.1 kg 104.2 kg    Examination: Physical exam: General: Acute on chronically ill-appearing morbidly obese male, sitting in the couch HEENT: [place]/AT, eyes anicteric.  moist mucus membranes Neuro: Alert, awake following commands Chest: Coarse breath sounds, no wheezes or rhonchi Heart: Pacer wire in place, currently in paced rhythm, no murmurs or gallops Abdomen: Soft, nontender, nondistended, bowel sounds present.  Peritoneal dialysis catheter in place Skin: No rash  Resolved Hospital Problem list     Assessment & Plan:  Acute NSTEMI Coronary artery disease s/p 2-vessel CABG Cardiogenic shock Mobitz type II heart block Acute hypoxic respiratory failure due to pulmonary edema, currently on nasal cannula oxygen Acute delirium, improved Hypertension Hyperlipidemia Diabetes type 2 End-stage renal disease on peritoneal dialysis.  Currently on CRRT Hyperkalemia, resolved Acute blood loss anemia, perioperative Obesity hypoventilation syndrome/OSA on BiPAP Morbid obesity   Continue aspirin and statin Will be started on Plavix once pacer wires comes out Continue to require vasopressor support with Levophed, titrate with map  of 65 Continue midodrine to 15 mg 3 times daily Monitor intake and output He was placed on BiPAP overnight, required Precedex Started on Seroquel and melatonin, so we can avoid Precedex infusion Currently on nasal cannula oxygen Patient hemoglobin is slowly trending down Monitor H&H and transfuse per TCTS  Received 2 units PRBCs on this admission Continue pain control with tramadol, oxycodone and IV morphine Blood sugar remain controlled Continue insulin with CBG goal 140-180 Continue CRRT for now, transition back to PD once stable and off pressors Patient is in paced rhythm, EP is following, patient will probably require PPM prior to discharge  Best Practice (right click and "Reselect all SmartList Selections" daily)   Diet/type: Regular consistency DVT prophylaxis: Subcu heparin GI prophylaxis: Protonix Lines: Central line and yes and it is still needed Foley: Discontinue Code Status:  full code Last date of multidisciplinary goals of care discussion [Per primary team]  Labs   CBC: Recent Labs  Lab 06/03/22 0648 06/04/22 0557 06/06/22 1235 06/06/22 1346 06/06/22 1651 06/07/22 0344 06/07/22 0612 06/07/22 0630 06/07/22 2136 06/08/22 0415 06/09/22 0405  WBC 5.1   < > 8.7  --  10.4 9.3  --   --   --  5.1 5.4  NEUTROABS 4.0  --   --   --   --   --   --   --   --   --   --   HGB 8.3*   < > 7.5*   < > 7.9* 8.3* 5.1* 7.8* 7.1* 7.8* 7.2*  HCT 25.0*   < > 24.0*   < > 24.7* 25.0* 15.0* 23.0* 21.0* 25.3* 23.7*  MCV 94.0   < > 96.8  --  95.0 94.3  --   --   --  97.7 98.8  PLT 90*   < > 108*  --  133* 134*  --   --   --  155 163   < > = values in this interval not displayed.    Basic Metabolic Panel: Recent Labs  Lab 06/06/22 0303 06/06/22 9563 06/06/22 1651 06/07/22 0344 06/07/22 0612 06/07/22 1613 06/07/22 2136 06/08/22 0415 06/08/22 1510 06/09/22 0405  NA 135   < > 136  135 137   < > 137 136 139 138 140  K 6.1*   < > 5.2*  5.2* 4.2   < > 4.5 4.4 4.5 4.3 4.1   CL 100  --  101  101 102  --  101  --  103 102 102  CO2 21*  --  22  22 20*  --  23  --  '24 24 26  '$ GLUCOSE 132*  --  129*  130* 134*  --  133*  --  126* 129* 105*  BUN 49*  --  47*  47* 30*  --  23  --  '19 18 15  '$ CREATININE 19.02*  --  16.63*  16.95* 11.40*  --  8.11*  --  6.08* 5.10* 4.30*  CALCIUM 8.0*  --  7.7*  7.6* 7.8*  --  8.3*  --  8.6* 8.5* 8.5*  MG 2.0  --  2.0 2.0  --   --   --  2.4  --  2.4  PHOS  --   --  4.6 3.7  --  3.6  --  2.8 2.5 2.2*   < > = values in this interval not displayed.   GFR: Estimated Creatinine Clearance: 17 mL/min (A) (by C-G formula based on SCr of 4.3 mg/dL (H)). Recent Labs  Lab 06/06/22 1651 06/07/22 0344 06/08/22 0415 06/09/22 0405  WBC 10.4 9.3 5.1 5.4    Liver Function Tests: Recent Labs  Lab 06/03/22 0648 06/05/22 0539 06/07/22 0344 06/07/22 1613 06/08/22 0415 06/08/22 1510 06/09/22 0405  AST 20  --   --   --   --   --   --   ALT 14  --   --   --   --   --   --   ALKPHOS 35*  --   --   --   --   --   --   BILITOT 0.3  --   --   --   --   --   --   PROT 5.9*  --   --   --   --   --   --   ALBUMIN 2.7*   < > 2.7* 2.7* 2.5* 2.4* 2.2*   < > = values in this interval not displayed.   No results for input(s): "LIPASE", "AMYLASE" in the last 168 hours. No results for input(s): "AMMONIA" in the last 168 hours.  ABG    Component Value Date/Time   PHART 7.372 06/07/2022 2136   PCO2ART 45.6 06/07/2022 2136   PO2ART 156 (H) 06/07/2022 2136   HCO3 26.4 06/07/2022 2136   TCO2 28 06/07/2022 2136   ACIDBASEDEF 2.0 06/07/2022 0630   O2SAT 99 06/07/2022 2136     Coagulation Profile: Recent Labs  Lab 06/04/22 2118 06/05/22 1318  INR 1.0 1.2    Cardiac Enzymes: No results for input(s): "CKTOTAL", "CKMB", "CKMBINDEX", "TROPONINI" in the last 168 hours.  HbA1C: Hemoglobin A1C  Date/Time Value Ref Range Status  12/17/2021 12:00 AM 6.3  Final  12/19/2016 12:00 AM 6.4  Final   HbA1c, POC (controlled diabetic range)   Date/Time Value Ref Range Status  06/16/2021 10:50 AM 6.2 0.0 - 7.0 % Final  06/16/2020 10:03 AM 6.1 0.0 - 7.0 % Final   Hgb A1c MFr Bld  Date/Time Value Ref Range Status  05/30/2022 11:48 AM 5.9 (H) 4.8 - 5.6 % Final    Comment:    (NOTE) Pre diabetes:          5.7%-6.4%  Diabetes:              >6.4%  Glycemic control for   <7.0% adults with diabetes   07/22/2021 02:16 PM 6.6 (H) 4.8 - 5.6 % Final    Comment:    (NOTE) Pre diabetes:          5.7%-6.4%  Diabetes:              >6.4%  Glycemic control for   <7.0% adults with diabetes     CBG: Recent Labs  Lab 06/08/22 0738 06/08/22 1119 06/08/22 1518 06/08/22 2110 06/09/22 0735  GLUCAP 146* 136* 126* 91 110*   Critical care time:  Total critical care time: 32 minutes  Performed by: [place] care time was exclusive of separately billable procedures and treating other patients.   Critical care was necessary to treat or prevent imminent or life-threatening deterioration.   Critical care was time spent personally by me on the following activities: development of treatment plan with patient and/or surrogate as well as nursing, discussions with consultants, evaluation of patient's response to treatment, examination of patient, obtaining history from patient or surrogate, ordering and performing treatments and interventions, ordering and review of laboratory studies, ordering and review of radiographic studies, pulse oximetry and re-evaluation of patient's condition.   Jacky Kindle, MD [facility] See Amion for pager If no response to pager, please call [phone] until 7pm After 7pm, Please call E-link [phone]

## 2022-06-09 NOTE — Progress Notes (Signed)
[name] NEPHROLOGY PROGRESS NOTE  Assessment/ Plan: Pt is a 71 y.o. yo male   with past medical history significant for OSA, COPD, type II DM, CAD, ESRD on PD, anemia who was presented with chest pain, and NSTEMI, seen as a consultation for the management of ESRD.   Dialysis Orders from previous admission in 01/2022.: [facility] therapies.  CCPD, 5 exchanges 3 liter fill volumes, 10 min fill time, 1 hour 20 minutes dwell, 20 minute drain.  Last fill Icodextran.  EDW 103 kg. Uses combination of 1.5% and 2.5% depending upon BP and weight    #NSTEMI/unstable angina: Cardiac cath on 11/14 with severe in in-stent restenosis in the right coronary artery and proximal LAD disease.  Underwent CABG on 11/20. Surgery following   # ESRD on PD: CCPD at night, 5 exchanges, 2-2.5 L fill volume, mix of 1.5 and 2.5% solution.  He does have left upper extremity AV fistula but likely not fully functional. Patient was complaining of abdominal distention and difficulty with 2.5 L of fill volume on 11/17.  We lowered fill volume to 2 L and he tolerated well.  Held RRT on 11/20 but then started CRRT on 11/21.  Temporary dialysis catheter placed by CCM.  Appreciate help.  Continue CRRT for now.  Transition back to PD as tolerated likely in the next few days as his pressor requirement decreases.  #Hyperkalemia: Improved with CRRT    # Hypertension/Shock: now with hypotension related surgery/shock. Pressors per primary.  UF as tolerated   # Anemia of ESRD: Hemoglobin around 8-9.  Iron saturation 27 with high ferritin.  Aranesp on board. Received transfusion with surgery  #CKD-MBD: Calcium and phosphorus level acceptable.  Continue calcitriol.  Holding sevelamer  Subjective: Patient resting today with no complaints.  Norepinephrine continues to be around 9.  -500 cc over the past 24 hours.  Objective Vital signs in last 24 hours: Vitals:   06/09/22 0515 06/09/22 0600 06/09/22 0700  06/09/22 0737  BP:  96/65 104/69   Pulse:      Resp: (!) '30 19 20   '$ Temp:    98.7 F (37.1 C)  TempSrc:    Oral  SpO2: 100%     Weight:      Height:       Weight change: -0.9 kg  Intake/Output Summary (Last 24 hours) at 06/09/2022 0754 Last data filed at 06/09/2022 0700 Gross per 24 hour  Intake 1224.11 ml  Output 1742 ml  Net -517.89 ml       Labs: RENAL PANEL Recent Labs    05/30/22 1148 05/30/22 1507 06/01/22 0212 06/02/22 0312 06/03/22 0648 06/05/22 0539 06/05/22 0818 06/05/22 1013 06/05/22 1148 06/05/22 1224 06/05/22 1321 06/05/22 1857 06/06/22 0006 06/06/22 0303 06/06/22 0109 06/06/22 1346 06/06/22 1651 06/07/22 0344 06/07/22 0612 06/07/22 0630 06/07/22 1613 06/07/22 2136 06/08/22 0415 06/08/22 1510 06/09/22 0405  NA  --    < > 136 134* 137 138   < > 136   < > 139   < > 137   < > 135   < > 139 136  135 137 142 134* 137 136 139 138 140  K  --    < > 3.9 3.9 4.1 4.3   < > 4.9   < > 3.9   < > 5.3*   < > 6.1*   < > 4.4 5.2*  5.2* 4.2 2.7* 4.1 4.5 4.4 4.5 4.3 4.1  CL  --    < >  96* 94* 98 98   < > 100  --  103  --  100  --  100  --   --  101  101 102  --   --  101  --  103 102 102  CO2  --    < > '26 26 25 27  '$ --   --   --   --   --  22  --  21*  --   --  22  22 20*  --   --  23  --  '24 24 26  '$ GLUCOSE  --    < > 121* 132* 127* 143*   < > 148*  --  106*  --  148*  --  132*  --   --  129*  130* 134*  --   --  133*  --  126* 129* 105*  BUN  --    < > 50* 47* 50* 52*   < > 44*  --  43*  --  48*  --  49*  --   --  47*  47* 30*  --   --  23  --  '19 18 15  '$ CREATININE  --    < > 19.46* 19.06* 20.40* 20.83*   < > >18.00*  --  >18.00*  --  19.52*  --  19.02*  --   --  16.63*  16.95* 11.40*  --   --  8.11*  --  6.08* 5.10* 4.30*  CALCIUM  --    < > 9.3 9.0 9.2 9.1  --   --   --   --   --  7.9*  --  8.0*  --   --  7.7*  7.6* 7.8*  --   --  8.3*  --  8.6* 8.5* 8.5*  MG 2.1  --  2.0 2.0 2.1  --   --   --   --   --   --  1.9  --  2.0  --   --  2.0 2.0  --   --    --   --  2.4  --  2.4  PHOS  --   --  3.3 3.3 4.0 3.6  --   --   --   --   --   --   --   --   --   --  4.6 3.7  --   --  3.6  --  2.8 2.5 2.2*  ALBUMIN 3.0*  --  2.6* 2.7* 2.7* 2.7*  --   --   --   --   --   --   --   --   --   --  2.7* 2.7*  --   --  2.7*  --  2.5* 2.4* 2.2*   < > = values in this interval not displayed.     Liver Function Tests: Recent Labs  Lab 06/03/22 2130 06/05/22 0539 06/08/22 0415 06/08/22 1510 06/09/22 0405  AST 20  --   --   --   --   ALT 14  --   --   --   --   ALKPHOS 35*  --   --   --   --   BILITOT 0.3  --   --   --   --   PROT 5.9*  --   --   --   --   ALBUMIN 2.7*   < > 2.5*  2.4* 2.2*   < > = values in this interval not displayed.   No results for input(s): "LIPASE", "AMYLASE" in the last 168 hours. No results for input(s): "AMMONIA" in the last 168 hours. CBC: Recent Labs    06/02/22 0312 06/03/22 0648 06/07/22 0612 06/07/22 0630 06/07/22 2136 06/08/22 0415 06/09/22 0405  HGB 8.5*   < > 5.1* 7.8* 7.1* 7.8* 7.2*  MCV 95.4   < >  --   --   --  97.7 98.8  FERRITIN 1,105*  --   --   --   --   --   --   TIBC 293  --   --   --   --   --   --   IRON 79  --   --   --   --   --   --    < > = values in this interval not displayed.    Cardiac Enzymes: No results for input(s): "CKTOTAL", "CKMB", "CKMBINDEX", "TROPONINI" in the last 168 hours. CBG: Recent Labs  Lab 06/08/22 0738 06/08/22 1119 06/08/22 1518 06/08/22 2110 06/09/22 0735  GLUCAP 146* 136* 126* 91 110*    Iron Studies: No results for input(s): "IRON", "TIBC", "TRANSFERRIN", "FERRITIN" in the last 72 hours. Studies/Results: DG CHEST PORT 1 VIEW  Result Date: 06/08/2022 CLINICAL DATA:  Shortness of breath. EXAM: PORTABLE CHEST 1 VIEW COMPARISON:  06/07/2022 FINDINGS: Low volume film. The cardio pericardial silhouette is enlarged. Bilateral IJ central lines evident with both tips position in the region of the innominate vein confluence. Drains overlie the right heart border and  left lung base. Persistent basilar atelectasis with probable tiny right effusion. No edema or focal airspace consolidation. No evidence for pneumothorax. Telemetry leads overlie the chest. IMPRESSION: Low volume film with bibasilar atelectasis and probable tiny right effusion. Electronically Signed   By: Misty Stanley M.D.   On: 06/08/2022 08:22    Medications: Infusions:   prismasol BGK 4/2.5 500 mL/hr at 06/08/22 2325    prismasol BGK 4/2.5 300 mL/hr at 06/08/22 2325   sodium chloride     sodium chloride 10 mL/hr at 06/09/22 0700   dexmedetomidine (PRECEDEX) IV infusion Stopped (06/09/22 0532)   dialysis solution 1.5% low-MG/low-CA     norepinephrine (LEVOPHED) Adult infusion 9 mcg/min (06/09/22 0700)   prismasol BGK 4/2.5 2,000 mL/hr at 06/09/22 0359   sodium phosphate 30 mmol in dextrose 5 % 250 mL infusion      Scheduled Medications:  acetaminophen  1,000 mg Oral Q6H   Or   acetaminophen (TYLENOL) oral liquid 160 mg/5 mL  1,000 mg Per Tube Q6H   aspirin EC  325 mg Oral Daily   Or   aspirin  324 mg Per Tube Daily   bisacodyl  10 mg Oral Daily   Or   bisacodyl  10 mg Rectal Daily   Chlorhexidine Gluconate Cloth  6 each Topical Daily   docusate sodium  200 mg Oral Daily   gentamicin cream  1 Application Topical Daily   heparin injection (subcutaneous)  5,000 Units Subcutaneous Q8H   insulin aspart  0-24 Units Subcutaneous TID AC & HS   insulin detemir  10 Units Subcutaneous Daily   midodrine  15 mg Oral TID WC   pantoprazole  40 mg Oral Daily   simvastatin  20 mg Oral q morning   sodium chloride flush  3 mL Intravenous Q12H    have reviewed scheduled and prn medications.  Physical Exam: General:lying  in bed, awake and in nad Heart:normal rate, 1+ edema in BLE Lungs: bilateral chest rise, ventilated Abdomen:soft, Non-tender, moderate distention Extremities: warm and well perfused Dialysis Access: PD catheter site clean, left upper extremity AV fistula has good thrill and  bruit but small and  deep  [name] [name] [name] [name] 06/09/2022,7:54 AM  LOS: 10 days

## 2022-06-09 NOTE — Progress Notes (Signed)
Rounding Note    Patient Name: [name] [name] Date of Encounter: 06/09/2022  [place] Cardiologist: Pixie Casino, MD   Subjective   Net negative 0.5L yesterday on CRRT.  On Levophed at 74mg/min.  He denies any dyspnea, does report some chest soreness.  Inpatient Medications    Scheduled Meds:  acetaminophen  1,000 mg Oral Q6H   Or   acetaminophen (TYLENOL) oral liquid 160 mg/5 mL  1,000 mg Per Tube Q6H   aspirin EC  325 mg Oral Daily   Or   aspirin  324 mg Per Tube Daily   atorvastatin  80 mg Oral Daily   bisacodyl  10 mg Oral Daily   Or   bisacodyl  10 mg Rectal Daily   Chlorhexidine Gluconate Cloth  6 each Topical Daily   darbepoetin (ARANESP) injection - DIALYSIS  150 mcg Subcutaneous Q Fri-1800   docusate sodium  200 mg Oral BID   gentamicin cream  1 Application Topical Daily   heparin injection (subcutaneous)  5,000 Units Subcutaneous Q8H   insulin aspart  0-24 Units Subcutaneous TID AC & HS   insulin detemir  10 Units Subcutaneous Daily   melatonin  3 mg Oral QHS   midodrine  15 mg Oral TID WC   pantoprazole  40 mg Oral Daily   polyethylene glycol  17 g Oral Daily   QUEtiapine  50 mg Oral QHS   senna  2 tablet Oral Daily   sodium chloride flush  3 mL Intravenous Q12H   Continuous Infusions:   prismasol BGK 4/2.5 500 mL/hr at 06/09/22 0930    prismasol BGK 4/2.5 300 mL/hr at 06/08/22 2325   sodium chloride     sodium chloride 10 mL/hr at 06/09/22 0900   dexmedetomidine (PRECEDEX) IV infusion Stopped (06/09/22 0532)   dialysis solution 1.5% low-MG/low-CA     norepinephrine (LEVOPHED) Adult infusion 10 mcg/min (06/09/22 0900)   prismasol BGK 4/2.5 2,000 mL/hr at 06/09/22 0850   sodium phosphate 30 mmol in dextrose 5 % 250 mL infusion     PRN Meds: sodium chloride, albuterol, heparin, metoprolol tartrate, morphine injection, ondansetron (ZOFRAN) IV, mouth rinse, mouth rinse, oxyCODONE, sodium chloride, traMADol   Vital Signs    Vitals:    06/09/22 0900 06/09/22 0915 06/09/22 0930 06/09/22 0945  BP:      Pulse:   88 89  Resp: (!) 22 (!) 26 (!) 21 19  Temp:      TempSrc:      SpO2:   94% 97%  Weight:      Height:        Intake/Output Summary (Last 24 hours) at 06/09/2022 0958 Last data filed at 06/09/2022 0900 Gross per 24 hour  Intake 1013.44 ml  Output 1234 ml  Net -220.56 ml       06/09/2022    5:00 AM 06/08/2022    5:00 AM 06/07/2022    5:00 AM  Last 3 Weights  Weight (lbs) 229 lb 11.5 oz 231 lb 11.3 oz 240 lb 4.8 oz  Weight (kg) 104.2 kg 105.1 kg 109 kg      Telemetry    V paced at 90.  Underlying rhythm is 2:1 block in 40s - Personally Reviewed  ECG    No new ECG - Personally Reviewed  Physical Exam   GEN: No acute distress.   Neck:  RIJ CVC Cardiac: RRR, no murmurs Respiratory: Diminished breath sounds GI: Soft, nontender MS: trace edema Neuro:  Nonfocal  Psych: Normal affect   Labs    High Sensitivity Troponin:   Recent Labs  Lab 05/30/22 0341 05/30/22 0640 05/30/22 1148  TROPONINIHS 100* 142* 330*      Chemistry Recent Labs  Lab 06/03/22 0648 06/05/22 0539 06/07/22 0344 06/07/22 0612 06/08/22 0415 06/08/22 1510 06/09/22 0405  NA 137   < > 137   < > 139 138 140  K 4.1   < > 4.2   < > 4.5 4.3 4.1  CL 98   < > 102   < > 103 102 102  CO2 25   < > 20*   < > '24 24 26  '$ GLUCOSE 127*   < > 134*   < > 126* 129* 105*  BUN 50*   < > 30*   < > '19 18 15  '$ CREATININE 20.40*   < > 11.40*   < > 6.08* 5.10* 4.30*  CALCIUM 9.2   < > 7.8*   < > 8.6* 8.5* 8.5*  MG 2.1   < > 2.0  --  2.4  --  2.4  PROT 5.9*  --   --   --   --   --   --   ALBUMIN 2.7*   < > 2.7*   < > 2.5* 2.4* 2.2*  AST 20  --   --   --   --   --   --   ALT 14  --   --   --   --   --   --   ALKPHOS 35*  --   --   --   --   --   --   BILITOT 0.3  --   --   --   --   --   --   GFRNONAA 2*   < > 4*   < > 9* 11* 14*  ANIONGAP 14   < > 15   < > '12 12 12   '$ < > = values in this interval not displayed.     Lipids   Recent Labs  Lab 06/03/22 0648  CHOL 88  TRIG 98  HDL 32*  LDLCALC 36  CHOLHDL 2.8     Hematology Recent Labs  Lab 06/07/22 0344 06/07/22 0612 06/07/22 2136 06/08/22 0415 06/09/22 0405  WBC 9.3  --   --  5.1 5.4  RBC 2.65*  --   --  2.59* 2.40*  HGB 8.3*   < > 7.1* 7.8* 7.2*  HCT 25.0*   < > 21.0* 25.3* 23.7*  MCV 94.3  --   --  97.7 98.8  MCH 31.3  --   --  30.1 30.0  MCHC 33.2  --   --  30.8 30.4  RDW 16.5*  --   --  16.3* 16.2*  PLT 134*  --   --  155 163   < > = values in this interval not displayed.    Thyroid No results for input(s): "TSH", "FREET4" in the last 168 hours.  BNPNo results for input(s): "BNP", "PROBNP" in the last 168 hours.  DDimer No results for input(s): "DDIMER" in the last 168 hours.   Radiology    DG CHEST PORT 1 VIEW  Result Date: 06/09/2022 CLINICAL DATA:  Atelectasis. EXAM: PORTABLE CHEST 1 VIEW COMPARISON:  Chest radiograph 06/08/2022 FINDINGS: Sequelae of CABG are again identified. The cardiac silhouette remains enlarged. Bilateral jugular venous catheters remain in place, terminating over the upper SVC. A mediastinal drain  and left chest tube have been removed. No sizable pleural effusion or pneumothorax is identified. Mild bibasilar opacities are similar to the prior study. IMPRESSION: 1. Interval mediastinal drain and chest tube removal. No pneumothorax. 2. Unchanged bibasilar atelectasis. Electronically Signed   By: Logan Bores M.D.   On: 06/09/2022 08:37   DG CHEST PORT 1 VIEW  Result Date: 06/08/2022 CLINICAL DATA:  Shortness of breath. EXAM: PORTABLE CHEST 1 VIEW COMPARISON:  06/07/2022 FINDINGS: Low volume film. The cardio pericardial silhouette is enlarged. Bilateral IJ central lines evident with both tips position in the region of the innominate vein confluence. Drains overlie the right heart border and left lung base. Persistent basilar atelectasis with probable tiny right effusion. No edema or focal airspace consolidation. No  evidence for pneumothorax. Telemetry leads overlie the chest. IMPRESSION: Low volume film with bibasilar atelectasis and probable tiny right effusion. Electronically Signed   By: Misty Stanley M.D.   On: 06/08/2022 08:22    Cardiac Studies     Patient Profile     71 y.o. male  renal disease on peritoneal dialysis, coronary artery disease with history of PCI of the right coronary artery for evaluation of unstable angina.  Cardiac catheterization revealed severe in-stent restenosis in the mid right coronary artery as well as the distal RCA lesion, 75% proximal LAD and 50% circumflex.  Echocardiogram shows normal LV function and mild aortic insufficiency.   Assessment & Plan    NSTEMI-cardiac catheterization revealed severe in-stent restenosis in the right coronary artery and also with proximal LAD disease.  Echocardiogram shows preserved LV function.  Status post CABG with LIMA-LAD, SVG-PDA on 06/05/2022.   -Continue aspirin, statin   Shock: currently on levophed at 8 mcg/min.  Echo 11/15 showed preserved LV function.   -Volume removal via CRRT -Started on midodrine, weaning pressors as tolerated  Acute hypoxic respiratory failure: Due to pulmonary edema following surgery.  PCCM following.  Now extubated. Volume removal via CRRT  2:1 AV block: currently with epicardial pacing, underlying rhythm is 2:1 AV block in 40s, LBBB.  From review of telemetry, appears was having 2:1 AV block in 40s prior to his surgery.  -Amiodarone and metoprolol discontinued -Suspect will need PPM, seen by EP: will monitor for now, would prefer to hold off on PPM until back on PD    End-stage renal disease-peritoneal dialysis dependent. Nephrology following, currently on CRRT     For questions or updates, please contact [facility] Please consult [email or web address] for contact info under   CRITICAL CARE TIME: I have spent a total of 32 minutes with patient reviewing hospital notes, telemetry, EKGs, labs  and examining the patient as well as establishing an assessment and plan that was discussed with the patient.  > 50% of time was spent in direct patient care. The patient is critically ill with multi-organ system failure and requires high complexity decision making for assessment and support, frequent evaluation and titration of therapies, application of advanced monitoring technologies and extensive interpretation of multiple databases.    Signed, Donato Heinz, MD  06/09/2022, 9:58 AM

## 2022-06-09 NOTE — Procedures (Signed)
I saw and evaluated the patient on CRRT.  I reviewed the last 24 hours events.  Adjustments to CRRT prescription are made as needed.  4K bath. Pull more UF today as tolerated.  Filed Weights   06/07/22 0500 06/08/22 0500 06/09/22 0500  Weight: 109 kg 105.1 kg 104.2 kg    Recent Labs  Lab 06/09/22 0405  NA 140  K 4.1  CL 102  CO2 26  GLUCOSE 105*  BUN 15  CREATININE 4.30*  CALCIUM 8.5*  PHOS 2.2*    Recent Labs  Lab 06/03/22 0648 06/04/22 0557 06/07/22 0344 06/07/22 0612 06/07/22 2136 06/08/22 0415 06/09/22 0405  WBC 5.1   < > 9.3  --   --  5.1 5.4  NEUTROABS 4.0  --   --   --   --   --   --   HGB 8.3*   < > 8.3*   < > 7.1* 7.8* 7.2*  HCT 25.0*   < > 25.0*   < > 21.0* 25.3* 23.7*  MCV 94.0   < > 94.3  --   --  97.7 98.8  PLT 90*   < > 134*  --   --  155 163   < > = values in this interval not displayed.    Scheduled Meds:  acetaminophen  1,000 mg Oral Q6H   Or   acetaminophen (TYLENOL) oral liquid 160 mg/5 mL  1,000 mg Per Tube Q6H   aspirin EC  325 mg Oral Daily   Or   aspirin  324 mg Per Tube Daily   bisacodyl  10 mg Oral Daily   Or   bisacodyl  10 mg Rectal Daily   Chlorhexidine Gluconate Cloth  6 each Topical Daily   darbepoetin (ARANESP) injection - DIALYSIS  150 mcg Subcutaneous Q Fri-1800   docusate sodium  200 mg Oral Daily   gentamicin cream  1 Application Topical Daily   heparin injection (subcutaneous)  5,000 Units Subcutaneous Q8H   insulin aspart  0-24 Units Subcutaneous TID AC & HS   insulin detemir  10 Units Subcutaneous Daily   midodrine  15 mg Oral TID WC   pantoprazole  40 mg Oral Daily   simvastatin  20 mg Oral q morning   sodium chloride flush  3 mL Intravenous Q12H   Continuous Infusions:   prismasol BGK 4/2.5 500 mL/hr at 06/08/22 2325    prismasol BGK 4/2.5 300 mL/hr at 06/08/22 2325   sodium chloride     sodium chloride 10 mL/hr at 06/09/22 0700   dexmedetomidine (PRECEDEX) IV infusion Stopped (06/09/22 0532)   dialysis  solution 1.5% low-MG/low-CA     norepinephrine (LEVOPHED) Adult infusion 9 mcg/min (06/09/22 0700)   prismasol BGK 4/2.5 2,000 mL/hr at 06/09/22 0359   sodium phosphate 30 mmol in dextrose 5 % 250 mL infusion     PRN Meds:.sodium chloride, albuterol, heparin, metoprolol tartrate, morphine injection, ondansetron (ZOFRAN) IV, mouth rinse, mouth rinse, oxyCODONE, sodium chloride, traMADol   Santiago Bumpers,  MD 06/09/2022, 7:56 AM

## 2022-06-09 NOTE — Progress Notes (Signed)
RT set up patient home unit CPAP at beside. Patient able to place on when ready.

## 2022-06-09 NOTE — Progress Notes (Signed)
RT placed patient on BIPAP HS. Patient tolerating well at this time.  

## 2022-06-10 DIAGNOSIS — J9601 Acute respiratory failure with hypoxia: Secondary | ICD-10-CM | POA: Diagnosis not present

## 2022-06-10 DIAGNOSIS — I214 Non-ST elevation (NSTEMI) myocardial infarction: Secondary | ICD-10-CM | POA: Diagnosis not present

## 2022-06-10 DIAGNOSIS — N186 End stage renal disease: Secondary | ICD-10-CM | POA: Diagnosis not present

## 2022-06-10 DIAGNOSIS — Z951 Presence of aortocoronary bypass graft: Secondary | ICD-10-CM | POA: Diagnosis not present

## 2022-06-10 LAB — RENAL FUNCTION PANEL
Albumin: 2.2 g/dL — ABNORMAL LOW (ref 3.5–5.0)
Albumin: 2.2 g/dL — ABNORMAL LOW (ref 3.5–5.0)
Anion gap: 10 (ref 5–15)
Anion gap: 6 (ref 5–15)
BUN: 12 mg/dL (ref 8–23)
BUN: 11 mg/dL (ref 8–23)
CO2: 25 mmol/L (ref 22–32)
CO2: 26 mmol/L (ref 22–32)
Calcium: 8 mg/dL — ABNORMAL LOW (ref 8.9–10.3)
Calcium: 8 mg/dL — ABNORMAL LOW (ref 8.9–10.3)
Chloride: 101 mmol/L (ref 98–111)
Chloride: 104 mmol/L (ref 98–111)
Creatinine, Ser: 3.09 mg/dL — ABNORMAL HIGH (ref 0.61–1.24)
Creatinine, Ser: 2.99 mg/dL — ABNORMAL HIGH (ref 0.61–1.24)
GFR, Estimated: 21 mL/min — ABNORMAL LOW (ref >60)
GFR, Estimated: 21 mL/min — ABNORMAL LOW (ref >60)
Glucose, Bld: 98 mg/dL (ref 70–99)
Glucose, Bld: 132 mg/dL — ABNORMAL HIGH (ref 70–99)
Phosphorus: 2.4 mg/dL — ABNORMAL LOW (ref 2.5–4.6)
Phosphorus: 2.7 mg/dL (ref 2.5–4.6)
Potassium: 4.1 mmol/L (ref 3.5–5.1)
Potassium: 4 mmol/L (ref 3.5–5.1)
Sodium: 136 mmol/L (ref 135–145)
Sodium: 136 mmol/L (ref 135–145)

## 2022-06-10 LAB — GLUCOSE, CAPILLARY
Glucose-Capillary: 102 mg/dL — ABNORMAL HIGH (ref 70–99)
Glucose-Capillary: 95 mg/dL (ref 70–99)
Glucose-Capillary: 107 mg/dL — ABNORMAL HIGH (ref 70–99)
Glucose-Capillary: 108 mg/dL — ABNORMAL HIGH (ref 70–99)

## 2022-06-10 LAB — TYPE AND SCREEN
ABO/RH(D): B POS
Antibody Screen: NEGATIVE
Unit division: 0

## 2022-06-10 LAB — BPAM RBC
Blood Product Expiration Date: 202312162359
ISSUE DATE / TIME: 202311241122
Unit Type and Rh: 7300

## 2022-06-10 LAB — CBC
HCT: 25.6 % — ABNORMAL LOW (ref 39.0–52.0)
Hemoglobin: 8 g/dL — ABNORMAL LOW (ref 13.0–17.0)
MCH: 30.2 pg (ref 26.0–34.0)
MCHC: 31.3 g/dL (ref 30.0–36.0)
MCV: 96.6 fL (ref 80.0–100.0)
Platelets: 145 10*3/uL — ABNORMAL LOW (ref 150–400)
RBC: 2.65 MIL/uL — ABNORMAL LOW (ref 4.22–5.81)
RDW: 16.3 % — ABNORMAL HIGH (ref 11.5–15.5)
WBC: 4.7 10*3/uL (ref 4.0–10.5)
nRBC: 4.6 % — ABNORMAL HIGH (ref 0.0–0.2)

## 2022-06-10 LAB — MAGNESIUM: Magnesium: 2.5 mg/dL — ABNORMAL HIGH (ref 1.7–2.4)

## 2022-06-10 MED ORDER — INSULIN DETEMIR 100 UNIT/ML ~~LOC~~ SOLN
8.0000 [IU] | Freq: Every day | SUBCUTANEOUS | Status: DC
  Administered 2022-06-11 – 2022-06-19 (×9): 8 [IU] via SUBCUTANEOUS
  Filled 2022-06-10 (×9): qty 0.08

## 2022-06-10 MED ORDER — NEPRO/CARBSTEADY PO LIQD
237.0000 mL | Freq: Every day | ORAL | Status: DC
  Administered 2022-06-11 – 2022-06-14 (×3): 237 mL via ORAL
  Filled 2022-06-10: qty 237

## 2022-06-10 MED ORDER — SORBITOL 70 % SOLN
30.0000 mL | Freq: Once | Status: AC
  Administered 2022-06-10: 30 mL via ORAL
  Filled 2022-06-10: qty 30

## 2022-06-10 MED ORDER — SODIUM PHOSPHATES 45 MMOLE/15ML IV SOLN
15.0000 mmol | Freq: Once | INTRAVENOUS | Status: AC
  Administered 2022-06-10: 15 mmol via INTRAVENOUS
  Filled 2022-06-10: qty 5

## 2022-06-10 MED ORDER — LACTULOSE 10 GM/15ML PO SOLN: 20.0000 g | Freq: Four times a day (QID) | ORAL | Status: DC

## 2022-06-10 MED ORDER — QUETIAPINE FUMARATE 25 MG PO TABS
25.0000 mg | ORAL_TABLET | Freq: Every day | ORAL | Status: DC
  Administered 2022-06-10 – 2022-06-18 (×9): 25 mg via ORAL
  Filled 2022-06-10 (×9): qty 1

## 2022-06-10 NOTE — Procedures (Signed)
I saw and evaluated the patient on CRRT.  I reviewed the last 24 hours events.  Adjustments to CRRT prescription are made as needed.  4K bath. Pull more UF today as tolerated without increasing pressors  Filed Weights   06/08/22 0500 06/09/22 0500 06/10/22 0500  Weight: 105.1 kg 104.2 kg 105 kg    Recent Labs  Lab 06/10/22 0421  NA 136  K 4.1  CL 101  CO2 25  GLUCOSE 98  BUN 12  CREATININE 3.09*  CALCIUM 8.0*  PHOS 2.4*    Recent Labs  Lab 06/09/22 0405 06/09/22 1601 06/10/22 0421  WBC 5.4 5.6 4.7  HGB 7.2* 8.3* 8.0*  HCT 23.7* 25.8* 25.6*  MCV 98.8 95.6 96.6  PLT 163 153 145*    Scheduled Meds:  acetaminophen  1,000 mg Oral Q6H   Or   acetaminophen (TYLENOL) oral liquid 160 mg/5 mL  1,000 mg Per Tube Q6H   aspirin EC  325 mg Oral Daily   Or   aspirin  324 mg Per Tube Daily   atorvastatin  80 mg Oral Daily   bisacodyl  10 mg Oral Daily   Or   bisacodyl  10 mg Rectal Daily   Chlorhexidine Gluconate Cloth  6 each Topical Daily   darbepoetin (ARANESP) injection - DIALYSIS  150 mcg Subcutaneous Q Fri-1800   docusate sodium  200 mg Oral BID   gentamicin cream  1 Application Topical Daily   heparin injection (subcutaneous)  5,000 Units Subcutaneous Q8H   insulin aspart  0-24 Units Subcutaneous TID AC & HS   insulin detemir  10 Units Subcutaneous Daily   melatonin  3 mg Oral QHS   midodrine  15 mg Oral TID WC   pantoprazole  40 mg Oral Daily   polyethylene glycol  17 g Oral Daily   QUEtiapine  50 mg Oral QHS   senna  2 tablet Oral Daily   sodium chloride flush  3 mL Intravenous Q12H   Continuous Infusions:   prismasol BGK 4/2.5 500 mL/hr at 06/10/22 0753    prismasol BGK 4/2.5 300 mL/hr at 06/09/22 1820   sodium chloride     sodium chloride 10 mL/hr at 06/10/22 0700   dexmedetomidine (PRECEDEX) IV infusion Stopped (06/10/22 0527)   dialysis solution 1.5% low-MG/low-CA     norepinephrine (LEVOPHED) Adult infusion 6 mcg/min (06/10/22 0700)   prismasol BGK  4/2.5 2,000 mL/hr at 06/10/22 0753   sodium phosphate 15 mmol in dextrose 5 % 250 mL infusion 15 mmol (06/10/22 0758)   PRN Meds:.sodium chloride, albuterol, heparin, metoprolol tartrate, morphine injection, ondansetron (ZOFRAN) IV, mouth rinse, mouth rinse, oxyCODONE, sodium chloride, traMADol   Santiago Bumpers,  MD 06/10/2022, 8:05 AM

## 2022-06-10 NOTE — Progress Notes (Signed)
Rounding Note    Patient Name: [name] [name] Date of Encounter: 06/10/2022  [place] Cardiologist: Pixie Casino, MD    Subjective    71 yo with hx of CAD , NSTEMI, s/p CABG Requiring levophed support , midodrine On CRRT - has ESRD , typically has peritoneal dialysis  Bradycardic - amio and metoprolol have been stopped   Inpatient Medications    Scheduled Meds:  acetaminophen  1,000 mg Oral Q6H   Or   acetaminophen (TYLENOL) oral liquid 160 mg/5 mL  1,000 mg Per Tube Q6H   aspirin EC  325 mg Oral Daily   Or   aspirin  324 mg Per Tube Daily   atorvastatin  80 mg Oral Daily   bisacodyl  10 mg Oral Daily   Or   bisacodyl  10 mg Rectal Daily   Chlorhexidine Gluconate Cloth  6 each Topical Daily   darbepoetin (ARANESP) injection - DIALYSIS  150 mcg Subcutaneous Q Fri-1800   docusate sodium  200 mg Oral BID   gentamicin cream  1 Application Topical Daily   heparin injection (subcutaneous)  5,000 Units Subcutaneous Q8H   insulin aspart  0-24 Units Subcutaneous TID AC & HS   insulin detemir  10 Units Subcutaneous Daily   melatonin  3 mg Oral QHS   midodrine  15 mg Oral TID WC   pantoprazole  40 mg Oral Daily   polyethylene glycol  17 g Oral Daily   QUEtiapine  50 mg Oral QHS   senna  2 tablet Oral Daily   sodium chloride flush  3 mL Intravenous Q12H   sorbitol  30 mL Oral Once   Continuous Infusions:   prismasol BGK 4/2.5 500 mL/hr at 06/10/22 0753    prismasol BGK 4/2.5 300 mL/hr at 06/09/22 1820   sodium chloride     sodium chloride 10 mL/hr at 06/10/22 0800   dexmedetomidine (PRECEDEX) IV infusion Stopped (06/10/22 0527)   dialysis solution 1.5% low-MG/low-CA     norepinephrine (LEVOPHED) Adult infusion 6 mcg/min (06/10/22 0800)   prismasol BGK 4/2.5 2,000 mL/hr at 06/10/22 0753   sodium phosphate 15 mmol in dextrose 5 % 250 mL infusion 43 mL/hr at 06/10/22 0800   PRN Meds: sodium chloride, albuterol, heparin, metoprolol tartrate, morphine  injection, ondansetron (ZOFRAN) IV, mouth rinse, mouth rinse, oxyCODONE, sodium chloride, traMADol   Vital Signs    Vitals:   06/10/22 0700 06/10/22 0735 06/10/22 0800 06/10/22 0805  BP: 94/68 (!) 83/59 (!) 70/52 (!) 85/60  Pulse:   89   Resp: (!) 21 (!) 25 (!) 27 (!) 23  Temp:  97.7 F (36.5 C)    TempSrc:  Oral    SpO2:   97%   Weight:      Height:        Intake/Output Summary (Last 24 hours) at 06/10/2022 0857 Last data filed at 06/10/2022 0800 Gross per 24 hour  Intake 1253.2 ml  Output 1607.7 ml  Net -354.5 ml      06/10/2022    5:00 AM 06/09/2022    5:00 AM 06/08/2022    5:00 AM  Last 3 Weights  Weight (lbs) 231 lb 7.7 oz 229 lb 11.5 oz 231 lb 11.3 oz  Weight (kg) 105 kg 104.2 kg 105.1 kg      Telemetry    V pacing - Personally Reviewed  ECG     - Personally Reviewed  Physical Exam   GEN: generally uncomfortable .  Neck:  No JVD Cardiac: RRR, no murmurs, rubs, or gallops.  Respiratory: Clear to auscultation bilaterally. GI: Soft, nontender, non-distended  MS: No edema; No deformity. Neuro:  Nonfocal  Psych: Normal affect   Labs    High Sensitivity Troponin:   Recent Labs  Lab 05/30/22 0341 05/30/22 0640 05/30/22 1148  TROPONINIHS 100* 142* 330*     Chemistry Recent Labs  Lab 06/08/22 0415 06/08/22 1510 06/09/22 0405 06/09/22 1602 06/10/22 0421  NA 139   < > 140 139 136  K 4.5   < > 4.1 4.1 4.1  CL 103   < > 102 103 101  CO2 24   < > '26 24 25  '$ GLUCOSE 126*   < > 105* 109* 98  BUN 19   < > '15 15 12  '$ CREATININE 6.08*   < > 4.30* 3.67* 3.09*  CALCIUM 8.6*   < > 8.5* 8.0* 8.0*  MG 2.4  --  2.4  --  2.5*  ALBUMIN 2.5*   < > 2.2* 2.3* 2.2*  GFRNONAA 9*   < > 14* 17* 21*  ANIONGAP 12   < > '12 12 10   '$ < > = values in this interval not displayed.    Lipids No results for input(s): "CHOL", "TRIG", "HDL", "LABVLDL", "LDLCALC", "CHOLHDL" in the last 168 hours.  Hematology Recent Labs  Lab 06/09/22 0405 06/09/22 1601 06/10/22 0421   WBC 5.4 5.6 4.7  RBC 2.40* 2.70* 2.65*  HGB 7.2* 8.3* 8.0*  HCT 23.7* 25.8* 25.6*  MCV 98.8 95.6 96.6  MCH 30.0 30.7 30.2  MCHC 30.4 32.2 31.3  RDW 16.2* 16.0* 16.3*  PLT 163 153 145*   Thyroid No results for input(s): "TSH", "FREET4" in the last 168 hours.  BNPNo results for input(s): "BNP", "PROBNP" in the last 168 hours.  DDimer No results for input(s): "DDIMER" in the last 168 hours.   Radiology    DG CHEST PORT 1 VIEW  Result Date: 06/09/2022 CLINICAL DATA:  Atelectasis. EXAM: PORTABLE CHEST 1 VIEW COMPARISON:  Chest radiograph 06/08/2022 FINDINGS: Sequelae of CABG are again identified. The cardiac silhouette remains enlarged. Bilateral jugular venous catheters remain in place, terminating over the upper SVC. A mediastinal drain and left chest tube have been removed. No sizable pleural effusion or pneumothorax is identified. Mild bibasilar opacities are similar to the prior study. IMPRESSION: 1. Interval mediastinal drain and chest tube removal. No pneumothorax. 2. Unchanged bibasilar atelectasis. Electronically Signed   By: Logan Bores M.D.   On: 06/09/2022 08:37    Cardiac Studies      Patient Profile     71 y.o. male    Assessment & Plan         CAD :   Status post bypass grafting.  He is progressing very slowly.  2.  End-stage renal disease: Currently on continuous ultrafiltration.  3.  Bradycardia: Currently pacing. And metoprolol on hold.  I suspect that he will need a permanent pacemaker.  Will have him seen by electrophysiology at some point.  4.   Hypotension:  cont  current meds.      No new recommendations.      For questions or updates, please contact [place] Please consult [email or web address] for contact info under        Signed, Mertie Moores, MD  06/10/2022, 8:57 AM

## 2022-06-10 NOTE — Progress Notes (Signed)
[address]       [place] [address]             [phone]      5 Days Post-Op  Procedure(s) (LRB): OFF PUMP CORONARY ARTERY BYPASS GRAFTING (CABG) X 2 BYPASSES USING LEFT INTERNAL MAMMARY ARTERY AND RIGHT LEG GREATER SAPHENOUS VEIN HARVESTED ENDOSCOPICALLY (N/A) TRANSESOPHAGEAL ECHOCARDIOGRAM (TEE) (N/A)  Total Length of Stay:  LOS: 11 days   SUBJECTIVE: Doesn't feel as well today as yesterday Had seroquel last night and slept well Hasn't moved bowels for several days   Vitals:   06/10/22 0700 06/10/22 0735  BP: 94/68   Pulse:    Resp: (!) 21   Temp:  97.7 F (36.5 C)  SpO2:      Intake/Output      11/24 0701 11/25 0700 11/25 0701 11/26 0700   P.O. 600    I.V. (mL/kg) 197.4 (1.9)    Blood 315    Other     IV Piggyback 263.1    Total Intake(mL/kg) 1375.5 (13.1)    Urine (mL/kg/hr)     Chest Tube     CRRT 1647.7    Total Output 1647.7    Net -272.2              prismasol BGK 4/2.5 500 mL/hr at 06/10/22 0753    prismasol BGK 4/2.5 300 mL/hr at 06/09/22 1820   sodium chloride     sodium chloride 10 mL/hr at 06/10/22 0700   dexmedetomidine (PRECEDEX) IV infusion Stopped (06/10/22 0527)   dialysis solution 1.5% low-MG/low-CA     norepinephrine (LEVOPHED) Adult infusion 6 mcg/min (06/10/22 0700)   prismasol BGK 4/2.5 2,000 mL/hr at 06/10/22 0753   sodium phosphate 15 mmol in dextrose 5 % 250 mL infusion 15 mmol (06/10/22 0758)    CBC    Component Value Date/Time   WBC 4.7 06/10/2022 0421   RBC 2.65 (L) 06/10/2022 0421   HGB 8.0 (L) 06/10/2022 0421   HGB 11.3 (L) 12/22/2021 1017   HCT 25.6 (L) 06/10/2022 0421   HCT 33.7 (L) 12/22/2021 1017   PLT 145 (L) 06/10/2022 0421   PLT 117 (L) 12/22/2021 1017   MCV 96.6 06/10/2022 0421   MCV 91 12/22/2021 1017   MCH 30.2 06/10/2022 0421   MCHC 31.3 06/10/2022 0421   RDW 16.3 (H) 06/10/2022 0421   RDW 13.7 12/22/2021 1017   LYMPHSABS 0.5 (L) 06/03/2022 0648   MONOABS 0.5  06/03/2022 0648   EOSABS 0.1 06/03/2022 0648   BASOSABS 0.0 06/03/2022 0648   CMP     Component Value Date/Time   NA 136 06/10/2022 0421   NA 136 12/22/2021 1017   K 4.1 06/10/2022 0421   CL 101 06/10/2022 0421   CO2 25 06/10/2022 0421   GLUCOSE 98 06/10/2022 0421   BUN 12 06/10/2022 0421   BUN 58 (H) 12/22/2021 1017   CREATININE 3.09 (H) 06/10/2022 0421   CREATININE 18.89 (H) 02/09/2020 1126   CALCIUM 8.0 (L) 06/10/2022 0421   PROT 5.9 (L) 06/03/2022 0648   ALBUMIN 2.2 (L) 06/10/2022 0421   AST 20 06/03/2022 0648   ALT 14 06/03/2022 0648   ALKPHOS 35 (L) 06/03/2022 0648   BILITOT 0.3 06/03/2022 0648   GFRNONAA 21 (L) 06/10/2022 0421   GFRNONAA 2 (L) 02/09/2020 1126   GFRAA <4 (L) 02/09/2020 1126   ABG    Component Value Date/Time   PHART 7.372 06/07/2022 2136   PCO2ART  45.6 06/07/2022 2136   PO2ART 156 (H) 06/07/2022 2136   HCO3 26.4 06/07/2022 2136   TCO2 28 06/07/2022 2136   ACIDBASEDEF 2.0 06/07/2022 0630   O2SAT 99 06/07/2022 2136   CBG (last 3)  Recent Labs    06/09/22 1526 06/09/22 2108 06/10/22 0732  GLUCAP 97 97 102*   EXAM Lungs: overall decreased Card: rr Ext: no significant edema   ASSESSMENT: POD #5 Off pump CABG Hemodynamically still requiring support with Levophed. On midodrine, will check echo today to evaluate function/effusion Renal Failure: still on CVVH basically equal on I & Os yesterday Constipation: not eating very well, encourage diet and give suppository Bradycardia: awaiting PPM   Coralie Common, MD 06/10/2022

## 2022-06-10 NOTE — Progress Notes (Signed)
NAME:  [name] [name], MRN:  [id number], DOB:  09/16/1950, LOS: 106 ADMISSION DATE:  05/30/2022, CONSULTATION DATE: 06/05/2022 REFERRING MD: Dr. Kipp Brood, CHIEF COMPLAINT: S/p CABG  History of Present Illness:  71 year old male with end-stage renal disease on peritoneal dialysis, diabetes type 2, hypertension, hyperlipidemia, coronary artery disease, OSA and COPD, who was admitted with unstable angina, on work-up noted to have multivessel coronary artery disease underwent CABG x2, postop requiring vasopressor support and remain on full mechanical ventilatory support. Received 2 units of PRBCs in OR PCCM was consulted for help evaluation medical management  Pertinent  Medical History   Past Medical History:  Diagnosis Date   Anemia    Arthritis    Asthma    Bell palsy    CAD (coronary artery disease)    a. 2014 MV: abnl w/ infap ischemia; b. 03/2013 Cath: aneurysmal bleb in the LAD w/ otw nonobs dzs-->Med Rx.   Chronic back pain    Chronic knee pain    a. 09/2015 s/p R TKA.   Chronic pain    Chronic shoulder pain    Chronic sinusitis    COPD (chronic obstructive pulmonary disease) ([place])    Diabetes mellitus without complication ([place])    type II    ESRD on peritoneal dialysis ([place])    on peritoneal dialysis, [facility]   Essential hypertension    GERD (gastroesophageal reflux disease)    Gout    Gout    Hepatomegaly    noted on noncontrast CT 2015   History of hiatal hernia    Hyperlipidemia    Lateral meniscus tear    Obesity    Truncal   Obstructive sleep apnea    does not use cpap    On home oxygen therapy    uses 2l when is going somewhere per patient    PUD (peptic ulcer disease)    remote, reports f/u EGD about 8 years ago unremarkable    Reactive airway disease    related to exposure to chemical during 9/11   Sinusitis    Vitamin D deficiency      Significant Hospital Events: Including procedures, antibiotic start and stop dates in addition to other  pertinent events     Interim History / Subjective:  Patient received Seroquel overnight, then was placed on BiPAP Stated slept well  Now on nasal cannula oxygen Continue to require vasopressor support with low-dose Levophed Still on CRRT, tolerating well so far   Objective   Blood pressure (!) 85/60, pulse 88, temperature 97.7 F (36.5 C), temperature source Oral, resp. rate (!) 28, height '5\' 4"'$  (1.626 m), weight 105 kg, SpO2 90 %. CVP:  [4 mmHg-21 mmHg] 4 mmHg      Intake/Output Summary (Last 24 hours) at 06/10/2022 1035 Last data filed at 06/10/2022 0900 Gross per 24 hour  Intake 1394.99 ml  Output 1567.8 ml  Net -172.81 ml   Filed Weights   06/08/22 0500 06/09/22 0500 06/10/22 0500  Weight: 105.1 kg 104.2 kg 105 kg    Examination: Physical exam: General: Acute on chronically ill-appearing morbidly obese male, lying in the bed HEENT: [place]/AT, eyes anicteric.  moist mucus membranes Neuro: Sleepy today, opens eyes with vocal stimuli, following commands Chest: Coarse breath sounds, no wheezes or rhonchi Heart: Pacer wire in place, currently in paced rhythm, no murmurs or gallops Abdomen: Soft, nontender, nondistended, bowel sounds present.  Peritoneal dialysis catheter in place Skin: No rash  Resolved Hospital Problem list  Assessment & Plan:  Acute NSTEMI Coronary artery disease s/p 2-vessel CABG Cardiogenic shock Mobitz type II heart block Acute hypoxic respiratory failure due to pulmonary edema, currently on nasal cannula oxygen Acute delirium, improved Hypertension Hyperlipidemia Diabetes type 2 End-stage renal disease on peritoneal dialysis.  Currently on CRRT Hyperkalemia, resolved Acute blood loss anemia, perioperative Obesity hypoventilation syndrome/OSA on BiPAP Morbid obesity   Continue aspirin and statin Will be started on Plavix once pacer wires comes out Continue to require vasopressor support with Levophed, titrate with map of 65 Continue  midodrine to 15 mg 3 times daily Monitor intake and output He was placed on BiPAP overnight, he was given Seroquel and was started on low-dose Precedex, this morning he feels lethargic and not feeling well Will stop Precedex Decrease Seroquel to 25 mg at bedtime  Currently on nasal cannula oxygen Patient hemoglobin is slowly trending down He received 1 unit of PRBC yesterday, 2 units prior Monitor H&H and transfuse per TCTS  Continue pain control with tramadol, oxycodone and IV morphine Blood sugar remain controlled Decrease Levemir to 8 units daily Continue sliding scale insulin with CBG goal 140-180 Continue CRRT for now, transition back to PD once stable and off pressors Patient is in paced rhythm, EP is following, patient will probably require PPM prior to discharge  Best Practice (right click and "Reselect all SmartList Selections" daily)   Diet/type: Regular consistency DVT prophylaxis: Subcu heparin GI prophylaxis: Protonix Lines: Central line and yes and it is still needed Foley: Discontinue Code Status:  full code Last date of multidisciplinary goals of care discussion [Per primary team]  Labs   CBC: Recent Labs  Lab 06/07/22 0344 06/07/22 0612 06/07/22 2136 06/08/22 0415 06/09/22 0405 06/09/22 1601 06/10/22 0421  WBC 9.3  --   --  5.1 5.4 5.6 4.7  HGB 8.3*   < > 7.1* 7.8* 7.2* 8.3* 8.0*  HCT 25.0*   < > 21.0* 25.3* 23.7* 25.8* 25.6*  MCV 94.3  --   --  97.7 98.8 95.6 96.6  PLT 134*  --   --  155 163 153 145*   < > = values in this interval not displayed.    Basic Metabolic Panel: Recent Labs  Lab 06/06/22 1651 06/07/22 0344 06/07/22 0612 06/08/22 0415 06/08/22 1510 06/09/22 0405 06/09/22 1602 06/10/22 0421  NA 136  135 137   < > 139 138 140 139 136  K 5.2*  5.2* 4.2   < > 4.5 4.3 4.1 4.1 4.1  CL 101  101 102   < > 103 102 102 103 101  CO2 22  22 20*   < > '24 24 26 24 25  '$ GLUCOSE 129*  130* 134*   < > 126* 129* 105* 109* 98  BUN 47*  47* 30*    < > '19 18 15 15 12  '$ CREATININE 16.63*  16.95* 11.40*   < > 6.08* 5.10* 4.30* 3.67* 3.09*  CALCIUM 7.7*  7.6* 7.8*   < > 8.6* 8.5* 8.5* 8.0* 8.0*  MG 2.0 2.0  --  2.4  --  2.4  --  2.5*  PHOS 4.6 3.7   < > 2.8 2.5 2.2* 4.3 2.4*   < > = values in this interval not displayed.   GFR: Estimated Creatinine Clearance: 23.7 mL/min (A) (by C-G formula based on SCr of 3.09 mg/dL (H)). Recent Labs  Lab 06/08/22 0415 06/09/22 0405 06/09/22 1601 06/10/22 0421  WBC 5.1 5.4 5.6 4.7  Liver Function Tests: Recent Labs  Lab 06/08/22 0415 06/08/22 1510 06/09/22 0405 06/09/22 1602 06/10/22 0421  ALBUMIN 2.5* 2.4* 2.2* 2.3* 2.2*   No results for input(s): "LIPASE", "AMYLASE" in the last 168 hours. No results for input(s): "AMMONIA" in the last 168 hours.  ABG    Component Value Date/Time   PHART 7.372 06/07/2022 2136   PCO2ART 45.6 06/07/2022 2136   PO2ART 156 (H) 06/07/2022 2136   HCO3 26.4 06/07/2022 2136   TCO2 28 06/07/2022 2136   ACIDBASEDEF 2.0 06/07/2022 0630   O2SAT 99 06/07/2022 2136     Coagulation Profile: Recent Labs  Lab 06/04/22 2118 06/05/22 1318  INR 1.0 1.2    Cardiac Enzymes: No results for input(s): "CKTOTAL", "CKMB", "CKMBINDEX", "TROPONINI" in the last 168 hours.  HbA1C: Hemoglobin A1C  Date/Time Value Ref Range Status  12/17/2021 12:00 AM 6.3  Final  12/19/2016 12:00 AM 6.4  Final   HbA1c, POC (controlled diabetic range)  Date/Time Value Ref Range Status  06/16/2021 10:50 AM 6.2 0.0 - 7.0 % Final  06/16/2020 10:03 AM 6.1 0.0 - 7.0 % Final   Hgb A1c MFr Bld  Date/Time Value Ref Range Status  05/30/2022 11:48 AM 5.9 (H) 4.8 - 5.6 % Final    Comment:    (NOTE) Pre diabetes:          5.7%-6.4%  Diabetes:              >6.4%  Glycemic control for   <7.0% adults with diabetes   07/22/2021 02:16 PM 6.6 (H) 4.8 - 5.6 % Final    Comment:    (NOTE) Pre diabetes:          5.7%-6.4%  Diabetes:              >6.4%  Glycemic control for    <7.0% adults with diabetes     CBG: Recent Labs  Lab 06/09/22 0735 06/09/22 1104 06/09/22 1526 06/09/22 2108 06/10/22 0732  GLUCAP 110* 120* 97 97 102*   Critical care time:     Total critical care time: 33 minutes  Performed by: [place] care time was exclusive of separately billable procedures and treating other patients.   Critical care was necessary to treat or prevent imminent or life-threatening deterioration.   Critical care was time spent personally by me on the following activities: development of treatment plan with patient and/or surrogate as well as nursing, discussions with consultants, evaluation of patient's response to treatment, examination of patient, obtaining history from patient or surrogate, ordering and performing treatments and interventions, ordering and review of laboratory studies, ordering and review of radiographic studies, pulse oximetry and re-evaluation of patient's condition.   Jacky Kindle, MD [facility] See Amion for pager If no response to pager, please call [phone] until 7pm After 7pm, Please call E-link [phone]

## 2022-06-10 NOTE — Progress Notes (Signed)
[facility] NEPHROLOGY PROGRESS NOTE  Assessment/ Plan: Pt is a 71 y.o. yo male   with past medical history significant for OSA, COPD, type II DM, CAD, ESRD on PD, anemia who was presented with chest pain, and NSTEMI, seen as a consultation for the management of ESRD.   Dialysis Orders from previous admission in 01/2022.: [facility] therapies.  CCPD, 5 exchanges 3 liter fill volumes, 10 min fill time, 1 hour 20 minutes dwell, 20 minute drain.  Last fill Icodextran.  EDW 103 kg. Uses combination of 1.5% and 2.5% depending upon BP and weight    #NSTEMI/unstable angina: Cardiac cath on 11/14 with severe in in-stent restenosis in the right coronary artery and proximal LAD disease.  Underwent CABG on 11/20. Surgery following   # ESRD on PD: CCPD at night, 5 exchanges, 2-2.5 L fill volume, mix of 1.5 and 2.5% solution.  He does have left upper extremity AV fistula but likely not fully functional. Patient was complaining of abdominal distention and difficulty with 2.5 L of fill volume on 11/17.  We lowered fill volume to 2 L and he tolerated well.  Held RRT on 11/20 but then started CRRT on 11/21.  Temporary dialysis catheter placed by CCM.  Appreciate help.  Continue CRRT for now.  Transition back to PD as tolerated likely in the next few days as his pressor requirement decreases. -Will plan to flush PD catheter today  #Hyperkalemia: Improved with CRRT    # Hypertension/Shock: now with hypotension related surgery/shock. Pressors per primary.  UF as tolerated   # Anemia of ESRD: Hemoglobin around 8-9.  Iron saturation 27 with high ferritin.  Aranesp on board. Received transfusion with surgery  #CKD-MBD: Calcium and phosphorus level acceptable.  Continue calcitriol.  Holding sevelamer  Subjective: Patient resting today with no complaints.  Norepi requirement decreased but persistent at 4 this AM. Essentially even for 24 hours  Objective Vital signs in last 24 hours: Vitals:    06/10/22 0500 06/10/22 0600 06/10/22 0700 06/10/22 0735  BP:  (!) 70/50 94/68   Pulse:      Resp: (!) 22 (!) 24 (!) 21   Temp:    97.7 F (36.5 C)  TempSrc:    Oral  SpO2:      Weight: 105 kg     Height:       Weight change: 0.8 kg  Intake/Output Summary (Last 24 hours) at 06/10/2022 0803 Last data filed at 06/10/2022 0700 Gross per 24 hour  Intake 1236.69 ml  Output 1607.7 ml  Net -371.01 ml       Labs: RENAL PANEL Recent Labs    06/01/22 0212 06/02/22 0312 06/03/22 0648 06/05/22 0539 06/05/22 0818 06/05/22 1857 06/06/22 0006 06/06/22 0303 06/06/22 2979 06/06/22 1651 06/07/22 0344 06/07/22 0612 06/07/22 0630 06/07/22 1613 06/07/22 2136 06/08/22 0415 06/08/22 1510 06/09/22 0405 06/09/22 1602 06/10/22 0421  NA 136 134* 137 138   < > 137   < > 135   < > 136  135 137 142 134* 137 136 139 138 140 139 136  K 3.9 3.9 4.1 4.3   < > 5.3*   < > 6.1*   < > 5.2*  5.2* 4.2 2.7* 4.1 4.5 4.4 4.5 4.3 4.1 4.1 4.1  CL 96* 94* 98 98   < > 100  --  100  --  101  101 102  --   --  101  --  103 102 102 103 101  CO2 '26 26 25 27  '$ --  22  --  21*  --  22  22 20*  --   --  23  --  '24 24 26 24 25  '$ GLUCOSE 121* 132* 127* 143*   < > 148*  --  132*  --  129*  130* 134*  --   --  133*  --  126* 129* 105* 109* 98  BUN 50* 47* 50* 52*   < > 48*  --  49*  --  47*  47* 30*  --   --  23  --  '19 18 15 15 12  '$ CREATININE 19.46* 19.06* 20.40* 20.83*   < > 19.52*  --  19.02*  --  16.63*  16.95* 11.40*  --   --  8.11*  --  6.08* 5.10* 4.30* 3.67* 3.09*  CALCIUM 9.3 9.0 9.2 9.1  --  7.9*  --  8.0*  --  7.7*  7.6* 7.8*  --   --  8.3*  --  8.6* 8.5* 8.5* 8.0* 8.0*  MG 2.0 2.0 2.1  --   --  1.9  --  2.0  --  2.0 2.0  --   --   --   --  2.4  --  2.4  --  2.5*  PHOS 3.3 3.3 4.0 3.6  --   --   --   --   --  4.6 3.7  --   --  3.6  --  2.8 2.5 2.2* 4.3 2.4*  ALBUMIN 2.6* 2.7* 2.7* 2.7*  --   --   --   --   --  2.7* 2.7*  --   --  2.7*  --  2.5* 2.4* 2.2* 2.3* 2.2*   < > = values in this interval  not displayed.     Liver Function Tests: Recent Labs  Lab 06/09/22 0405 06/09/22 1602 06/10/22 0421  ALBUMIN 2.2* 2.3* 2.2*   No results for input(s): "LIPASE", "AMYLASE" in the last 168 hours. No results for input(s): "AMMONIA" in the last 168 hours. CBC: Recent Labs    06/02/22 0312 06/03/22 0648 06/07/22 2136 06/08/22 0415 06/09/22 0405 06/09/22 1601 06/10/22 0421  HGB 8.5*   < > 7.1* 7.8* 7.2* 8.3* 8.0*  MCV 95.4   < >  --  97.7 98.8 95.6 96.6  FERRITIN 1,105*  --   --   --   --   --   --   TIBC 293  --   --   --   --   --   --   IRON 79  --   --   --   --   --   --    < > = values in this interval not displayed.    Cardiac Enzymes: No results for input(s): "CKTOTAL", "CKMB", "CKMBINDEX", "TROPONINI" in the last 168 hours. CBG: Recent Labs  Lab 06/09/22 0735 06/09/22 1104 06/09/22 1526 06/09/22 2108 06/10/22 0732  GLUCAP 110* 120* 97 97 102*    Iron Studies: No results for input(s): "IRON", "TIBC", "TRANSFERRIN", "FERRITIN" in the last 72 hours. Studies/Results: DG CHEST PORT 1 VIEW  Result Date: 06/09/2022 CLINICAL DATA:  Atelectasis. EXAM: PORTABLE CHEST 1 VIEW COMPARISON:  Chest radiograph 06/08/2022 FINDINGS: Sequelae of CABG are again identified. The cardiac silhouette remains enlarged. Bilateral jugular venous catheters remain in place, terminating over the upper SVC. A mediastinal drain and left chest tube have been removed. No sizable pleural effusion or pneumothorax is  identified. Mild bibasilar opacities are similar to the prior study. IMPRESSION: 1. Interval mediastinal drain and chest tube removal. No pneumothorax. 2. Unchanged bibasilar atelectasis. Electronically Signed   By: Logan Bores M.D.   On: 06/09/2022 08:37    Medications: Infusions:   prismasol BGK 4/2.5 500 mL/hr at 06/10/22 0753    prismasol BGK 4/2.5 300 mL/hr at 06/09/22 1820   sodium chloride     sodium chloride 10 mL/hr at 06/10/22 0700   dexmedetomidine (PRECEDEX) IV infusion  Stopped (06/10/22 0527)   dialysis solution 1.5% low-MG/low-CA     norepinephrine (LEVOPHED) Adult infusion 6 mcg/min (06/10/22 0700)   prismasol BGK 4/2.5 2,000 mL/hr at 06/10/22 0753   sodium phosphate 15 mmol in dextrose 5 % 250 mL infusion 15 mmol (06/10/22 0758)    Scheduled Medications:  acetaminophen  1,000 mg Oral Q6H   Or   acetaminophen (TYLENOL) oral liquid 160 mg/5 mL  1,000 mg Per Tube Q6H   aspirin EC  325 mg Oral Daily   Or   aspirin  324 mg Per Tube Daily   atorvastatin  80 mg Oral Daily   bisacodyl  10 mg Oral Daily   Or   bisacodyl  10 mg Rectal Daily   Chlorhexidine Gluconate Cloth  6 each Topical Daily   darbepoetin (ARANESP) injection - DIALYSIS  150 mcg Subcutaneous Q Fri-1800   docusate sodium  200 mg Oral BID   gentamicin cream  1 Application Topical Daily   heparin injection (subcutaneous)  5,000 Units Subcutaneous Q8H   insulin aspart  0-24 Units Subcutaneous TID AC & HS   insulin detemir  10 Units Subcutaneous Daily   melatonin  3 mg Oral QHS   midodrine  15 mg Oral TID WC   pantoprazole  40 mg Oral Daily   polyethylene glycol  17 g Oral Daily   QUEtiapine  50 mg Oral QHS   senna  2 tablet Oral Daily   sodium chloride flush  3 mL Intravenous Q12H    have reviewed scheduled and prn medications.  Physical Exam: General:lying in bed, awake and in nad Heart:normal rate, 1+ edema in BLE Lungs: bilateral chest rise, ventilated Abdomen:soft, Non-tender, moderate distention Extremities: warm and well perfused Dialysis Access: PD catheter site clean, left upper extremity AV fistula has good thrill and bruit but small and  deep  Shaune Pollack Tahje Borawski 06/10/2022,8:03 AM  LOS: 11 days

## 2022-06-10 NOTE — Progress Notes (Signed)
     [address]       [place] [address]             [phone]       EVENING ROUNDS  Improved hemodynamics today  Now on lower dose levophed Had a BM and eating better Hold on echo for now

## 2022-06-10 NOTE — Progress Notes (Signed)
Pt has home CPAP.  

## 2022-06-11 ENCOUNTER — Other Ambulatory Visit (HOSPITAL_COMMUNITY): Payer: Medicare Other

## 2022-06-11 DIAGNOSIS — J9601 Acute respiratory failure with hypoxia: Secondary | ICD-10-CM | POA: Diagnosis not present

## 2022-06-11 DIAGNOSIS — I4430 Unspecified atrioventricular block: Secondary | ICD-10-CM | POA: Diagnosis not present

## 2022-06-11 DIAGNOSIS — I214 Non-ST elevation (NSTEMI) myocardial infarction: Secondary | ICD-10-CM | POA: Diagnosis not present

## 2022-06-11 DIAGNOSIS — N186 End stage renal disease: Secondary | ICD-10-CM | POA: Diagnosis not present

## 2022-06-11 LAB — RENAL FUNCTION PANEL
Albumin: 2.2 g/dL — ABNORMAL LOW (ref 3.5–5.0)
Albumin: 2.4 g/dL — ABNORMAL LOW (ref 3.5–5.0)
Anion gap: 8 (ref 5–15)
Anion gap: 9 (ref 5–15)
BUN: 10 mg/dL (ref 8–23)
BUN: 12 mg/dL (ref 8–23)
CO2: 26 mmol/L (ref 22–32)
CO2: 26 mmol/L (ref 22–32)
Calcium: 7.9 mg/dL — ABNORMAL LOW (ref 8.9–10.3)
Calcium: 8.4 mg/dL — ABNORMAL LOW (ref 8.9–10.3)
Chloride: 102 mmol/L (ref 98–111)
Chloride: 104 mmol/L (ref 98–111)
Creatinine, Ser: 2.98 mg/dL — ABNORMAL HIGH (ref 0.61–1.24)
Creatinine, Ser: 3.65 mg/dL — ABNORMAL HIGH (ref 0.61–1.24)
GFR, Estimated: 22 mL/min — ABNORMAL LOW (ref >60)
GFR, Estimated: 17 mL/min — ABNORMAL LOW (ref >60)
Glucose, Bld: 117 mg/dL — ABNORMAL HIGH (ref 70–99)
Glucose, Bld: 156 mg/dL — ABNORMAL HIGH (ref 70–99)
Phosphorus: 2.4 mg/dL — ABNORMAL LOW (ref 2.5–4.6)
Phosphorus: 2.3 mg/dL — ABNORMAL LOW (ref 2.5–4.6)
Potassium: 4 mmol/L (ref 3.5–5.1)
Potassium: 4.3 mmol/L (ref 3.5–5.1)
Sodium: 136 mmol/L (ref 135–145)
Sodium: 139 mmol/L (ref 135–145)

## 2022-06-11 LAB — CBC
HCT: 25.5 % — ABNORMAL LOW (ref 39.0–52.0)
Hemoglobin: 8 g/dL — ABNORMAL LOW (ref 13.0–17.0)
MCH: 30.4 pg (ref 26.0–34.0)
MCHC: 31.4 g/dL (ref 30.0–36.0)
MCV: 97 fL (ref 80.0–100.0)
Platelets: 160 10*3/uL (ref 150–400)
RBC: 2.63 MIL/uL — ABNORMAL LOW (ref 4.22–5.81)
RDW: 16.5 % — ABNORMAL HIGH (ref 11.5–15.5)
WBC: 6.4 10*3/uL (ref 4.0–10.5)
nRBC: 6.1 % — ABNORMAL HIGH (ref 0.0–0.2)

## 2022-06-11 LAB — GLUCOSE, CAPILLARY
Glucose-Capillary: 114 mg/dL — ABNORMAL HIGH (ref 70–99)
Glucose-Capillary: 114 mg/dL — ABNORMAL HIGH (ref 70–99)
Glucose-Capillary: 146 mg/dL — ABNORMAL HIGH (ref 70–99)
Glucose-Capillary: 67 mg/dL — ABNORMAL LOW (ref 70–99)
Glucose-Capillary: 121 mg/dL — ABNORMAL HIGH (ref 70–99)
Glucose-Capillary: 148 mg/dL — ABNORMAL HIGH (ref 70–99)

## 2022-06-11 LAB — MAGNESIUM: Magnesium: 2.4 mg/dL (ref 1.7–2.4)

## 2022-06-11 MED ORDER — PHENAZOPYRIDINE HCL 100 MG PO TABS
100.0000 mg | ORAL_TABLET | Freq: Once | ORAL | Status: AC
  Administered 2022-06-11: 100 mg via ORAL
  Filled 2022-06-11: qty 1

## 2022-06-11 NOTE — Progress Notes (Signed)
Placed patient on home CPAP machine

## 2022-06-11 NOTE — Progress Notes (Signed)
[address]       [place] [address]             [phone]      6 Days Post-Op  Procedure(s) (LRB): OFF PUMP CORONARY ARTERY BYPASS GRAFTING (CABG) X 2 BYPASSES USING LEFT INTERNAL MAMMARY ARTERY AND RIGHT LEG GREATER SAPHENOUS VEIN HARVESTED ENDOSCOPICALLY (N/A) TRANSESOPHAGEAL ECHOCARDIOGRAM (TEE) (N/A)  Total Length of Stay:  LOS: 12 days   SUBJECTIVE: Slept well Levo was able to be lowered to 38mg Has faster underlying rhythm CVVH clotted off Vitals:   06/11/22 0600 06/11/22 0700  BP: 108/64 116/66  Pulse: 91   Resp: (!) 35 (!) 29  Temp:    SpO2: 94%     Intake/Output      11/25 0701 11/26 0700 11/26 0701 11/27 0700   P.O. 380    I.V. (mL/kg) 307.1 (3)    Blood     IV Piggyback 255    Total Intake(mL/kg) 942.1 (9.2)    Stool 0    CRRT 1025.2    Total Output 1025.2    Net -83.1         Stool Occurrence 2 x         prismasol BGK 4/2.5 500 mL/hr at 06/10/22 2127    prismasol BGK 4/2.5 300 mL/hr at 06/10/22 2126   sodium chloride     sodium chloride 10 mL/hr at 06/11/22 0700   dialysis solution 1.5% low-MG/low-CA     norepinephrine (LEVOPHED) Adult infusion 2 mcg/min (06/11/22 0700)   prismasol BGK 4/2.5 2,000 mL/hr at 06/10/22 2353    CBC    Component Value Date/Time   WBC 6.4 06/11/2022 0417   RBC 2.63 (L) 06/11/2022 0417   HGB 8.0 (L) 06/11/2022 0417   HGB 11.3 (L) 12/22/2021 1017   HCT 25.5 (L) 06/11/2022 0417   HCT 33.7 (L) 12/22/2021 1017   PLT 160 06/11/2022 0417   PLT 117 (L) 12/22/2021 1017   MCV 97.0 06/11/2022 0417   MCV 91 12/22/2021 1017   MCH 30.4 06/11/2022 0417   MCHC 31.4 06/11/2022 0417   RDW 16.5 (H) 06/11/2022 0417   RDW 13.7 12/22/2021 1017   LYMPHSABS 0.5 (L) 06/03/2022 0648   MONOABS 0.5 06/03/2022 0648   EOSABS 0.1 06/03/2022 0648   BASOSABS 0.0 06/03/2022 0648   CMP     Component Value Date/Time   NA 136 06/11/2022 0417   NA 136 12/22/2021 1017   K 4.0 06/11/2022 0417   CL 102  06/11/2022 0417   CO2 26 06/11/2022 0417   GLUCOSE 117 (H) 06/11/2022 0417   BUN 10 06/11/2022 0417   BUN 58 (H) 12/22/2021 1017   CREATININE 2.98 (H) 06/11/2022 0417   CREATININE 18.89 (H) 02/09/2020 1126   CALCIUM 7.9 (L) 06/11/2022 0417   PROT 5.9 (L) 06/03/2022 0648   ALBUMIN 2.2 (L) 06/11/2022 0417   AST 20 06/03/2022 0648   ALT 14 06/03/2022 0648   ALKPHOS 35 (L) 06/03/2022 0648   BILITOT 0.3 06/03/2022 0648   GFRNONAA 22 (L) 06/11/2022 0417   GFRNONAA 2 (L) 02/09/2020 1126   GFRAA <4 (L) 02/09/2020 1126   ABG    Component Value Date/Time   PHART 7.372 06/07/2022 2136   PCO2ART 45.6 06/07/2022 2136   PO2ART 156 (H) 06/07/2022 2136   HCO3 26.4 06/07/2022 2136   TCO2 28 06/07/2022 2136   ACIDBASEDEF 2.0 06/07/2022 0630   O2SAT 99 06/07/2022 2136   CBG (last  3)  Recent Labs    06/10/22 1527 06/10/22 1603 06/10/22 2220  GLUCAP 107* 114* 108*  EXAM Lungs: decreased at bases Card: RR Wounds: clean and dry Ext: warm   ASSESSMENT: POD #6 sp off pump CABG Hemodynamically improving with lower pressor needs. Cont to wean today Renal Failure: Nephrology to use peritoneal dialysis if CVVH clots off again. Had equal I & Os. Bradycardia: improving. Will decrease VVI to 75bpm OOB today   Coralie Common, MD 06/11/2022

## 2022-06-11 NOTE — Progress Notes (Signed)
[facility] NEPHROLOGY PROGRESS NOTE  Assessment/ Plan: Pt is a 71 y.o. yo male   with past medical history significant for OSA, COPD, type II DM, CAD, ESRD on PD, anemia who was presented with chest pain, and NSTEMI, seen as a consultation for the management of ESRD.   Dialysis Orders from previous admission in 01/2022.: [facility] therapies.  CCPD, 5 exchanges 3 liter fill volumes, 10 min fill time, 1 hour 20 minutes dwell, 20 minute drain.  Last fill Icodextran.  EDW 103 kg. Uses combination of 1.5% and 2.5% depending upon BP and weight    #NSTEMI/unstable angina: Cardiac cath on 11/14 with severe in in-stent restenosis in the right coronary artery and proximal LAD disease.  Underwent CABG on 11/20. Surgery following   # ESRD on PD: CCPD at night, 5 exchanges, 2-2.5 L fill volume, mix of 1.5 and 2.5% solution.  He does have left upper extremity AV fistula but likely not fully functional. Patient was complaining of abdominal distention and difficulty with 2.5 L of fill volume on 11/17.  We lowered fill volume to 2 L and he tolerated well.  Held RRT on 11/20 but then started CRRT on 11/21.  Temporary dialysis catheter placed by CCM.  Appreciate help.  Continue CRRT for now.   -PD catheter flushed this morning with no issues -Can no restart CRRT and likely plan for PD on 11/27    # Hypertension/Shock: now with hypotension related surgery/shock. Pressors per primary.     # Anemia of ESRD: Hemoglobin around 8-9.  Iron saturation 27 with high ferritin.  Aranesp on board. Received transfusion with surgery  #CKD-MBD: Calcium and phosphorus level acceptable.  Continue calcitriol.  Holding sevelamer  Subjective: Patient continues on CRRT.  Small amount of norepinephrine on board.  Some clotting of CRRT over the past couple days.  Otherwise no complaints  Objective Vital signs in last 24 hours: Vitals:   06/11/22 0600 06/11/22 0700 06/11/22 0730 06/11/22 0800  BP: 108/64  116/66 93/71 103/61  Pulse: 91   89  Resp: (!) 35 (!) 29 (!) 30 (!) 26  Temp:    98.1 F (36.7 C)  TempSrc:    Oral  SpO2: 94%   (!) 71%  Weight:      Height:       Weight change: -2.1 kg  Intake/Output Summary (Last 24 hours) at 06/11/2022 0850 Last data filed at 06/11/2022 0800 Gross per 24 hour  Intake 937.42 ml  Output 1057.5 ml  Net -120.08 ml       Labs: RENAL PANEL Recent Labs    06/02/22 0312 06/03/22 0648 06/05/22 0539 06/05/22 1857 06/06/22 0006 06/06/22 0303 06/06/22 2423 06/06/22 1651 06/07/22 0344 06/07/22 0612 06/07/22 0630 06/07/22 1613 06/07/22 2136 06/08/22 0415 06/08/22 1510 06/09/22 0405 06/09/22 1602 06/10/22 0421 06/10/22 1600 06/11/22 0417  NA 134* 137   < > 137   < > 135   < > 136  135 137   < > 134* 137 136 139 138 140 139 136 136 136  K 3.9 4.1   < > 5.3*   < > 6.1*   < > 5.2*  5.2* 4.2   < > 4.1 4.5 4.4 4.5 4.3 4.1 4.1 4.1 4.0 4.0  CL 94* 98   < > 100  --  100  --  101  101 102  --   --  101  --  103 102 102 103 101 104 102  CO2  26 25   < > 22  --  21*  --  22  22 20*  --   --  23  --  '24 24 26 24 25 26 26  '$ GLUCOSE 132* 127*   < > 148*  --  132*  --  129*  130* 134*  --   --  133*  --  126* 129* 105* 109* 98 132* 117*  BUN 47* 50*   < > 48*  --  49*  --  47*  47* 30*  --   --  23  --  '19 18 15 15 12 11 10  '$ CREATININE 19.06* 20.40*   < > 19.52*  --  19.02*  --  16.63*  16.95* 11.40*  --   --  8.11*  --  6.08* 5.10* 4.30* 3.67* 3.09* 2.99* 2.98*  CALCIUM 9.0 9.2   < > 7.9*  --  8.0*  --  7.7*  7.6* 7.8*  --   --  8.3*  --  8.6* 8.5* 8.5* 8.0* 8.0* 8.0* 7.9*  MG 2.0 2.1  --  1.9  --  2.0  --  2.0 2.0  --   --   --   --  2.4  --  2.4  --  2.5*  --  2.4  PHOS 3.3 4.0   < >  --   --   --   --  4.6 3.7  --   --  3.6  --  2.8 2.5 2.2* 4.3 2.4* 2.7 2.4*  ALBUMIN 2.7* 2.7*   < >  --   --   --   --  2.7* 2.7*  --   --  2.7*  --  2.5* 2.4* 2.2* 2.3* 2.2* 2.2* 2.2*   < > = values in this interval not displayed.     Liver Function  Tests: Recent Labs  Lab 06/10/22 0421 06/10/22 1600 06/11/22 0417  ALBUMIN 2.2* 2.2* 2.2*   No results for input(s): "LIPASE", "AMYLASE" in the last 168 hours. No results for input(s): "AMMONIA" in the last 168 hours. CBC: Recent Labs    06/02/22 0312 06/03/22 0648 06/08/22 0415 06/09/22 0405 06/09/22 1601 06/10/22 0421 06/11/22 0417  HGB 8.5*   < > 7.8* 7.2* 8.3* 8.0* 8.0*  MCV 95.4   < > 97.7 98.8 95.6 96.6 97.0  FERRITIN 1,105*  --   --   --   --   --   --   TIBC 293  --   --   --   --   --   --   IRON 79  --   --   --   --   --   --    < > = values in this interval not displayed.    Cardiac Enzymes: No results for input(s): "CKTOTAL", "CKMB", "CKMBINDEX", "TROPONINI" in the last 168 hours. CBG: Recent Labs  Lab 06/10/22 0732 06/10/22 1129 06/10/22 1527 06/10/22 1603 06/10/22 2220  GLUCAP 102* 95 107* 114* 108*    Iron Studies: No results for input(s): "IRON", "TIBC", "TRANSFERRIN", "FERRITIN" in the last 72 hours. Studies/Results: No results found.  Medications: Infusions:   prismasol BGK 4/2.5 500 mL/hr at 06/10/22 2127    prismasol BGK 4/2.5 300 mL/hr at 06/11/22 0804   sodium chloride     sodium chloride 10 mL/hr at 06/11/22 0800   dialysis solution 1.5% low-MG/low-CA     norepinephrine (LEVOPHED) Adult infusion 1 mcg/min (06/11/22 0800)   prismasol  BGK 4/2.5 2,000 mL/hr at 06/10/22 2353    Scheduled Medications:  aspirin EC  325 mg Oral Daily   Or   aspirin  324 mg Per Tube Daily   atorvastatin  80 mg Oral Daily   bisacodyl  10 mg Oral Daily   Or   bisacodyl  10 mg Rectal Daily   Chlorhexidine Gluconate Cloth  6 each Topical Daily   darbepoetin (ARANESP) injection - DIALYSIS  150 mcg Subcutaneous Q Fri-1800   docusate sodium  200 mg Oral BID   feeding supplement (NEPRO CARB STEADY)  237 mL Oral Q1400   gentamicin cream  1 Application Topical Daily   heparin injection (subcutaneous)  5,000 Units Subcutaneous Q8H   insulin aspart  0-24 Units  Subcutaneous TID AC & HS   insulin detemir  8 Units Subcutaneous Daily   melatonin  3 mg Oral QHS   midodrine  15 mg Oral TID WC   pantoprazole  40 mg Oral Daily   polyethylene glycol  17 g Oral Daily   QUEtiapine  25 mg Oral QHS   senna  2 tablet Oral Daily   sodium chloride flush  3 mL Intravenous Q12H    have reviewed scheduled and prn medications.  Physical Exam: General:lying in bed, awake and in nad Heart:normal rate, trace edema in BLE Lungs: bilateral chest rise, ventilated Abdomen:soft, Non-tender, moderate distention Extremities: warm and well perfused Dialysis Access: PD catheter site clean, left upper extremity AV fistula has good thrill and bruit but small and  deep  Yusuke Beza J Adline Kirshenbaum 06/11/2022,8:50 AM  LOS: 12 days

## 2022-06-11 NOTE — Progress Notes (Signed)
NAME:  [name] [name], MRN:  [id number], DOB:  1951-04-22, LOS: 88 ADMISSION DATE:  05/30/2022, CONSULTATION DATE: 06/05/2022 REFERRING MD: Dr. Kipp Brood, CHIEF COMPLAINT: S/p CABG  History of Present Illness:  71 year old male with end-stage renal disease on peritoneal dialysis, diabetes type 2, hypertension, hyperlipidemia, coronary artery disease, OSA and COPD, who was admitted with unstable angina, on work-up noted to have multivessel coronary artery disease underwent CABG x2, postop requiring vasopressor support and remain on full mechanical ventilatory support. Received 2 units of PRBCs in OR PCCM was consulted for help evaluation medical management  Pertinent  Medical History   Past Medical History:  Diagnosis Date   Anemia    Arthritis    Asthma    Bell palsy    CAD (coronary artery disease)    a. 2014 MV: abnl w/ infap ischemia; b. 03/2013 Cath: aneurysmal bleb in the LAD w/ otw nonobs dzs-->Med Rx.   Chronic back pain    Chronic knee pain    a. 09/2015 s/p R TKA.   Chronic pain    Chronic shoulder pain    Chronic sinusitis    COPD (chronic obstructive pulmonary disease) ([place])    Diabetes mellitus without complication ([place])    type II    ESRD on peritoneal dialysis ([place])    on peritoneal dialysis, [facility]   Essential hypertension    GERD (gastroesophageal reflux disease)    Gout    Gout    Hepatomegaly    noted on noncontrast CT 2015   History of hiatal hernia    Hyperlipidemia    Lateral meniscus tear    Obesity    Truncal   Obstructive sleep apnea    does not use cpap    On home oxygen therapy    uses 2l when is going somewhere per patient    PUD (peptic ulcer disease)    remote, reports f/u EGD about 8 years ago unremarkable    Reactive airway disease    related to exposure to chemical during 9/11   Sinusitis    Vitamin D deficiency      Significant Hospital Events: Including procedures, antibiotic start and stop dates in addition to other  pertinent events     Interim History / Subjective:  Patient received Seroquel overnight, then was placed on BiPAP, slept well He is on room air now Vasopressors were titrated off Off CRRT  Objective   Blood pressure 104/71, pulse 94, temperature 98.1 F (36.7 C), temperature source Oral, resp. rate (!) 21, height '5\' 4"'$  (1.626 m), weight 102.9 kg, SpO2 90 %. CVP:  [4 mmHg-67 mmHg] 10 mmHg      Intake/Output Summary (Last 24 hours) at 06/11/2022 1554 Last data filed at 06/11/2022 1519 Gross per 24 hour  Intake 913 ml  Output 829.8 ml  Net 83.2 ml   Filed Weights   06/09/22 0500 06/10/22 0500 06/11/22 0500  Weight: 104.2 kg 105 kg 102.9 kg    Examination: Physical exam: General: Acute on chronically ill-appearing morbidly obese male, lying in the bed HEENT: [place]/AT, eyes anicteric.  moist mucus membranes Neuro: Alert, awake, following commands, moving all 4 extremities  Chest: Coarse breath sounds, no wheezes or rhonchi Heart: Pacer wire in place, currently in paced rhythm, no murmurs or gallops Abdomen: Soft, nontender, nondistended, bowel sounds present.  Peritoneal dialysis catheter in place Skin: No rash  Resolved Hospital Problem list   Cardiogenic shock, resolved Acute hypoxic respiratory failure due to pulmonary edema, currently on  nasal cannula oxygen Acute delirium, improved Hyperkalemia, resolved  Assessment & Plan:  Acute NSTEMI Coronary artery disease s/p 2-vessel CABG Mobitz type II heart block Hypertension Hyperlipidemia Diabetes type 2 End-stage renal disease on peritoneal dialysis Acute blood loss anemia, perioperative Obesity hypoventilation syndrome/OSA on BiPAP Morbid obesity   Continue aspirin and statin Will be started on Plavix once pacer wires comes out Vasopressors were titrated off Continue midodrine to 15 mg 3 times daily Monitor intake and output Continue BiPAP at night Continue low-dose Seroquel 25 mg nightly Not requiring Precedex  anymore Patient hemoglobin is slowly trending down He received 1 unit of PRBC yesterday, 2 units prior, total 3 units on this admission Monitor H&H and transfuse per TCTS  Continue pain control with tramadol, oxycodone and IV morphine Blood sugar remain controlled Continue Levemir to 8 units daily Continue sliding scale insulin with CBG goal 140-180 CRRT was stopped, he will be transitioned back to PD  Patient is in paced rhythm, EP is following, patient will probably require PPM prior to discharge  Best Practice (right click and "Reselect all SmartList Selections" daily)   Diet/type: Regular consistency DVT prophylaxis: Subcu heparin GI prophylaxis: Protonix Lines: Central line and yes and it is still needed Foley: Discontinue Code Status:  full code Last date of multidisciplinary goals of care discussion [Per primary team]  Labs   CBC: Recent Labs  Lab 06/08/22 0415 06/09/22 0405 06/09/22 1601 06/10/22 0421 06/11/22 0417  WBC 5.1 5.4 5.6 4.7 6.4  HGB 7.8* 7.2* 8.3* 8.0* 8.0*  HCT 25.3* 23.7* 25.8* 25.6* 25.5*  MCV 97.7 98.8 95.6 96.6 97.0  PLT 155 163 153 145* 469    Basic Metabolic Panel: Recent Labs  Lab 06/07/22 0344 06/07/22 0612 06/08/22 0415 06/08/22 1510 06/09/22 0405 06/09/22 1602 06/10/22 0421 06/10/22 1600 06/11/22 0417  NA 137   < > 139   < > 140 139 136 136 136  K 4.2   < > 4.5   < > 4.1 4.1 4.1 4.0 4.0  CL 102   < > 103   < > 102 103 101 104 102  CO2 20*   < > 24   < > '26 24 25 26 26  '$ GLUCOSE 134*   < > 126*   < > 105* 109* 98 132* 117*  BUN 30*   < > 19   < > '15 15 12 11 10  '$ CREATININE 11.40*   < > 6.08*   < > 4.30* 3.67* 3.09* 2.99* 2.98*  CALCIUM 7.8*   < > 8.6*   < > 8.5* 8.0* 8.0* 8.0* 7.9*  MG 2.0  --  2.4  --  2.4  --  2.5*  --  2.4  PHOS 3.7   < > 2.8   < > 2.2* 4.3 2.4* 2.7 2.4*   < > = values in this interval not displayed.   GFR: Estimated Creatinine Clearance: 24.3 mL/min (A) (by C-G formula based on SCr of 2.98 mg/dL  (H)). Recent Labs  Lab 06/09/22 0405 06/09/22 1601 06/10/22 0421 06/11/22 0417  WBC 5.4 5.6 4.7 6.4    Liver Function Tests: Recent Labs  Lab 06/09/22 0405 06/09/22 1602 06/10/22 0421 06/10/22 1600 06/11/22 0417  ALBUMIN 2.2* 2.3* 2.2* 2.2* 2.2*   No results for input(s): "LIPASE", "AMYLASE" in the last 168 hours. No results for input(s): "AMMONIA" in the last 168 hours.  ABG    Component Value Date/Time   PHART 7.372 06/07/2022 2136   PCO2ART  45.6 06/07/2022 2136   PO2ART 156 (H) 06/07/2022 2136   HCO3 26.4 06/07/2022 2136   TCO2 28 06/07/2022 2136   ACIDBASEDEF 2.0 06/07/2022 0630   O2SAT 99 06/07/2022 2136     Coagulation Profile: Recent Labs  Lab 06/04/22 2118 06/05/22 1318  INR 1.0 1.2    Cardiac Enzymes: No results for input(s): "CKTOTAL", "CKMB", "CKMBINDEX", "TROPONINI" in the last 168 hours.  HbA1C: Hemoglobin A1C  Date/Time Value Ref Range Status  12/17/2021 12:00 AM 6.3  Final  12/19/2016 12:00 AM 6.4  Final   HbA1c, POC (controlled diabetic range)  Date/Time Value Ref Range Status  06/16/2021 10:50 AM 6.2 0.0 - 7.0 % Final  06/16/2020 10:03 AM 6.1 0.0 - 7.0 % Final   Hgb A1c MFr Bld  Date/Time Value Ref Range Status  05/30/2022 11:48 AM 5.9 (H) 4.8 - 5.6 % Final    Comment:    (NOTE) Pre diabetes:          5.7%-6.4%  Diabetes:              >6.4%  Glycemic control for   <7.0% adults with diabetes   07/22/2021 02:16 PM 6.6 (H) 4.8 - 5.6 % Final    Comment:    (NOTE) Pre diabetes:          5.7%-6.4%  Diabetes:              >6.4%  Glycemic control for   <7.0% adults with diabetes     CBG: Recent Labs  Lab 06/10/22 1603 06/10/22 2220 06/11/22 0759 06/11/22 1144 06/11/22 1147  GLUCAP 114* 108* 114* 67* 146*      Jacky Kindle, MD [facility] See Amion for pager If no response to pager, please call [phone] until 7pm After 7pm, Please call E-link [phone]

## 2022-06-12 DIAGNOSIS — N186 End stage renal disease: Secondary | ICD-10-CM | POA: Diagnosis not present

## 2022-06-12 DIAGNOSIS — R739 Hyperglycemia, unspecified: Secondary | ICD-10-CM

## 2022-06-12 DIAGNOSIS — J9601 Acute respiratory failure with hypoxia: Secondary | ICD-10-CM | POA: Diagnosis not present

## 2022-06-12 DIAGNOSIS — Z951 Presence of aortocoronary bypass graft: Secondary | ICD-10-CM | POA: Diagnosis not present

## 2022-06-12 DIAGNOSIS — I214 Non-ST elevation (NSTEMI) myocardial infarction: Secondary | ICD-10-CM | POA: Diagnosis not present

## 2022-06-12 LAB — CBC
HCT: 25 % — ABNORMAL LOW (ref 39.0–52.0)
Hemoglobin: 7.7 g/dL — ABNORMAL LOW (ref 13.0–17.0)
MCH: 30.7 pg (ref 26.0–34.0)
MCHC: 30.8 g/dL (ref 30.0–36.0)
MCV: 99.6 fL (ref 80.0–100.0)
Platelets: 167 10*3/uL (ref 150–400)
RBC: 2.51 MIL/uL — ABNORMAL LOW (ref 4.22–5.81)
RDW: 16.7 % — ABNORMAL HIGH (ref 11.5–15.5)
WBC: 8.3 10*3/uL (ref 4.0–10.5)
nRBC: 10.8 % — ABNORMAL HIGH (ref 0.0–0.2)

## 2022-06-12 LAB — RENAL FUNCTION PANEL
Albumin: 2.3 g/dL — ABNORMAL LOW (ref 3.5–5.0)
Anion gap: 17 — ABNORMAL HIGH (ref 5–15)
BUN: 17 mg/dL (ref 8–23)
CO2: 23 mmol/L (ref 22–32)
Calcium: 8.6 mg/dL — ABNORMAL LOW (ref 8.9–10.3)
Chloride: 98 mmol/L (ref 98–111)
Creatinine, Ser: 5.23 mg/dL — ABNORMAL HIGH (ref 0.61–1.24)
GFR, Estimated: 11 mL/min — ABNORMAL LOW (ref >60)
Glucose, Bld: 148 mg/dL — ABNORMAL HIGH (ref 70–99)
Phosphorus: 2.5 mg/dL (ref 2.5–4.6)
Potassium: 4 mmol/L (ref 3.5–5.1)
Sodium: 138 mmol/L (ref 135–145)

## 2022-06-12 LAB — GLUCOSE, CAPILLARY
Glucose-Capillary: 147 mg/dL — ABNORMAL HIGH (ref 70–99)
Glucose-Capillary: 132 mg/dL — ABNORMAL HIGH (ref 70–99)
Glucose-Capillary: 113 mg/dL — ABNORMAL HIGH (ref 70–99)
Glucose-Capillary: 172 mg/dL — ABNORMAL HIGH (ref 70–99)

## 2022-06-12 LAB — MAGNESIUM: Magnesium: 2.5 mg/dL — ABNORMAL HIGH (ref 1.7–2.4)

## 2022-06-12 MED ORDER — INSULIN ASPART 100 UNIT/ML IJ SOLN: 0.0000 [IU] | Freq: Every day | INTRAMUSCULAR | Status: DC

## 2022-06-12 MED ORDER — GENTAMICIN SULFATE 0.1 % EX CREA: 1.0000 | TOPICAL_CREAM | Freq: Every day | CUTANEOUS | Status: DC

## 2022-06-12 MED ORDER — INSULIN ASPART 100 UNIT/ML IJ SOLN
0.0000 [IU] | Freq: Three times a day (TID) | INTRAMUSCULAR | Status: DC
  Administered 2022-06-12 – 2022-06-14 (×5): 1 [IU] via SUBCUTANEOUS
  Administered 2022-06-15: 2 [IU] via SUBCUTANEOUS
  Administered 2022-06-15 – 2022-06-17 (×7): 1 [IU] via SUBCUTANEOUS
  Administered 2022-06-17 – 2022-06-18 (×3): 2 [IU] via SUBCUTANEOUS
  Administered 2022-06-18 – 2022-06-19 (×3): 1 [IU] via SUBCUTANEOUS

## 2022-06-12 NOTE — Progress Notes (Signed)
[address]       [place] [address]             [phone]                 7 Days Post-Op Procedure(s) (LRB): OFF PUMP CORONARY ARTERY BYPASS GRAFTING (CABG) X 2 BYPASSES USING LEFT INTERNAL MAMMARY ARTERY AND RIGHT LEG GREATER SAPHENOUS VEIN HARVESTED ENDOSCOPICALLY (N/A) TRANSESOPHAGEAL ECHOCARDIOGRAM (TEE) (N/A)   Events: No events _______________________________________________________________ Vitals: BP (!) 98/59   Pulse 94   Temp 98.9 F (37.2 C) (Oral)   Resp 20   Ht '5\' 4"'$  (1.626 m)   Wt 104.1 kg   SpO2 92%   BMI 39.39 kg/m  Filed Weights   06/10/22 0500 06/11/22 0500 06/12/22 0354  Weight: 105 kg 102.9 kg 104.1 kg     - Neuro: alert NAD  - Cardiovascular: sinus in 70s  Drips: none.   CVP:  [6 mmHg-14 mmHg] 14 mmHg  - Pulm: EWOb    ABG    Component Value Date/Time   PHART 7.372 06/07/2022 2136   PCO2ART 45.6 06/07/2022 2136   PO2ART 156 (H) 06/07/2022 2136   HCO3 26.4 06/07/2022 2136   TCO2 28 06/07/2022 2136   ACIDBASEDEF 2.0 06/07/2022 0630   O2SAT 99 06/07/2022 2136    - Abd: ND - Extremity: warm  .Intake/Output      11/26 0701 11/27 0700 11/27 0701 11/28 0700   P.O. 907    I.V. (mL/kg) 32.8 (0.3)    IV Piggyback     Total Intake(mL/kg) 939.8 (9)    Stool 1    CRRT 251.6    Total Output 252.6    Net +687.2            _______________________________________________________________ Labs:    Latest Ref Rng & Units 06/12/2022    3:42 AM 06/11/2022    4:17 AM 06/10/2022    4:21 AM  CBC  WBC 4.0 - 10.5 K/uL 8.3  6.4  4.7   Hemoglobin 13.0 - 17.0 g/dL 7.7  8.0  8.0   Hematocrit 39.0 - 52.0 % 25.0  25.5  25.6   Platelets 150 - 400 K/uL 167  160  145       Latest Ref Rng & Units 06/12/2022    3:41 AM 06/11/2022    4:42 PM 06/11/2022    4:17 AM  CMP  Glucose 70 - 99 mg/dL 148  156  117   BUN 8 - 23 mg/dL '17  12  10   '$ Creatinine 0.61 - 1.24 mg/dL 5.23  3.65  2.98   Sodium 135 - 145 mmol/L 138  139   136   Potassium 3.5 - 5.1 mmol/L 4.0  4.3  4.0   Chloride 98 - 111 mmol/L 98  104  102   CO2 22 - 32 mmol/L '23  26  26   '$ Calcium 8.9 - 10.3 mg/dL 8.6  8.4  7.9     CXR: -  _______________________________________________________________  Assessment and Plan: POD 7 s/p CABG  Neuro: pain controlled CV: off gtts.  Holding BB for 2nd degree block.  Temp wires in place.  Awaiting final EP recs Pulm: IS, ambulation  Renal: possible peritoneal dialysis today.  If stable, will remove temp cath GI: on diet Heme: stable.  Will start plavix once wires remove, and PPM placed ID: [email or web address] Endo: SSI Dispo: continue ICU care for now given likelihood of PPM placement  Lajuana Matte 06/12/2022 7:55 AM

## 2022-06-12 NOTE — Progress Notes (Signed)
NAME:  [name] [name], MRN:  [id number], DOB:  May 29, 1951, LOS: 84 ADMISSION DATE:  05/30/2022, CONSULTATION DATE: 06/05/2022 REFERRING MD: Dr. Kipp Brood, CHIEF COMPLAINT: S/p CABG  History of Present Illness:  71 year old male with end-stage renal disease on peritoneal dialysis, diabetes type 2, hypertension, hyperlipidemia, coronary artery disease, OSA and COPD, who was admitted with unstable angina, on work-up noted to have multivessel coronary artery disease underwent CABG x2, postop requiring vasopressor support and remain on full mechanical ventilatory support. Received 2 units of PRBCs in OR PCCM was consulted for help evaluation medical management  Pertinent  Medical History   Past Medical History:  Diagnosis Date   Anemia    Arthritis    Asthma    Bell palsy    CAD (coronary artery disease)    a. 2014 MV: abnl w/ infap ischemia; b. 03/2013 Cath: aneurysmal bleb in the LAD w/ otw nonobs dzs-->Med Rx.   Chronic back pain    Chronic knee pain    a. 09/2015 s/p R TKA.   Chronic pain    Chronic shoulder pain    Chronic sinusitis    COPD (chronic obstructive pulmonary disease) ([place])    Diabetes mellitus without complication ([place])    type II    ESRD on peritoneal dialysis ([place])    on peritoneal dialysis, [facility]   Essential hypertension    GERD (gastroesophageal reflux disease)    Gout    Gout    Hepatomegaly    noted on noncontrast CT 2015   History of hiatal hernia    Hyperlipidemia    Lateral meniscus tear    Obesity    Truncal   Obstructive sleep apnea    does not use cpap    On home oxygen therapy    uses 2l when is going somewhere per patient    PUD (peptic ulcer disease)    remote, reports f/u EGD about 8 years ago unremarkable    Reactive airway disease    related to exposure to chemical during 9/11   Sinusitis    Vitamin D deficiency      Significant Hospital Events: Including procedures, antibiotic start and stop dates in addition to other  pertinent events     Interim History / Subjective:  Off pressors. Nephro transitioning to PD today. Having sternal incisional pain. Also having penile pain, unclear etiology, no drainage/discharge. On bladder scan only had 46m, attempted in/out for UA but no output.  Objective   Blood pressure 99/69, pulse 79, temperature 98.3 F (36.8 C), temperature source Oral, resp. rate (!) 27, height '5\' 4"'$  (1.626 m), weight 104.1 kg, SpO2 100 %. CVP:  [8 mmHg-19 mmHg] 12 mmHg      Intake/Output Summary (Last 24 hours) at 06/12/2022 1126 Last data filed at 06/12/2022 0955 Gross per 24 hour  Intake 617 ml  Output 51 ml  Net 566 ml    Filed Weights   06/10/22 0500 06/11/22 0500 06/12/22 0354  Weight: 105 kg 102.9 kg 104.1 kg    Examination: General: Adult male, resting in bed, in NAD. Neuro: A&O x 3, no deficits. HEENT: [place]/AT. Sclerae anicteric. EOMI. Cardiovascular: RRR, no M/R/G.  Lungs: Respirations even and unlabored.  CTA bilaterally, No W/R/R. Abdomen: BS x 4, soft, NT/ND.  Musculoskeletal: No gross deformities, no edema.  Skin: Intact, warm, no rashes. GU: Penis examined given pain. No rash or lesions. No drainage/discharge.   Resolved Hospital Problem list   Cardiogenic shock, resolved Acute hypoxic respiratory failure  due to pulmonary edema, currently on nasal cannula oxygen Acute delirium, improved Hyperkalemia, resolved  Assessment & Plan:    Acute NSTEMI - s/p 2-vessel CABG 11/20 ([place]). - Post op care per TCTS. - Continue aspirin and statin   Mobitz type II heart block. Hx HTN, HLD.  - Continue temp pacing. - Will be started on Plavix once pacer wires comes out and PPM placed, awaiting EP recs.  Hx OSA/OHS - on nocturnal CPAP. - Continue nocturnal CPAP.  ESRD on PD. - Nephrology following, planning to transition to PD today and assess if will tolerate (required CRRT and pressors previously). - Remove HD cath only once we know he will tolerate PD. -  Continue Midodrine.  Diabetes type 2. - SSI, Levemir.  Acute blood loss anemia. - Transfuse for Hgb < 7.  Penis pain - unclear etiology. No clear wounds/lesions/rash, no discharge. - Send UA if able (attempted in/out cath but no urine obtained). - Supportive care.   Best Practice (right click and "Reselect all SmartList Selections" daily)  Per primary team.   CC time: 30 min.   Montey Hora, [facility] For pager details, please see AMION or use Epic chat  After 1900, please call [facility] for cross coverage needs 06/12/2022, 11:35 AM

## 2022-06-12 NOTE — Progress Notes (Signed)
CT surgery PM rounds  Patient sitting in chair position in bed breathing comfortably Peritoneal dialysis planned for this evening Tolerating regular renal diet Stable vital signs with heart rate 76 second-degree block.  Epicardial wires at V backup rate 50  Blood pressure (!) 107/51, pulse 76, temperature 98.4 F (36.9 C), temperature source Oral, resp. rate (!) 33, height '5\' 4"'$  (1.626 m), weight 104.1 kg, SpO2 98 %.

## 2022-06-12 NOTE — Inpatient Diabetes Management (Signed)
Inpatient Diabetes Program Recommendations  AACE/ADA: New Consensus Statement on Inpatient Glycemic Control (2015)  Target Ranges:  Prepandial:   less than 140 mg/dL      Peak postprandial:   less than 180 mg/dL (1-2 hours)      Critically ill patients:  140 - 180 mg/dL   Lab Results  Component Value Date   GLUCAP 147 (H) 06/12/2022   HGBA1C 5.9 (H) 05/30/2022    Review of Glycemic Control  Latest Reference Range & Units 06/11/22 11:47 06/11/22 16:32 06/11/22 21:13 06/12/22 07:33  Glucose-Capillary 70 - 99 mg/dL 146 (H) 121 (H) 148 (H) 147 (H)   Diabetes history: DM Outpatient Diabetes medications: Lantus 22 units q HS, Tradjenta 5 mg daily Current orders for Inpatient glycemic control:  TCTS 0-24 units tid with meals and HS Levemir 8 units daily Inpatient Diabetes Program Recommendations:  Consider reducing Novolog correction to sensitive (0-9 units) tid with meals and HS.   Thanks,  Adah Perl, RN, BC-ADM Inpatient Diabetes Coordinator Pager [phone]  (8a-5p)

## 2022-06-12 NOTE — Progress Notes (Signed)
[facility] NEPHROLOGY PROGRESS NOTE  Assessment/ Plan: Pt is a 71 y.o. yo male   with past medical history significant for OSA, COPD, type II DM, CAD, ESRD on PD, anemia who was presented with chest pain, and NSTEMI, seen as a consultation for the management of ESRD.   Dialysis Orders from previous admission in 01/2022.: [facility] therapies patient.  CCPD, 5 exchanges 3 liter fill volumes, 10 min fill time, 1 hour 20 minutes dwell, 20 minute drain.  Last fill Icodextran.  EDW 103 kg. Uses combination of 1.5% and 2.5% depending upon BP and weight    #NSTEMI/unstable angina: Cardiac cath on 11/14 with severe in in-stent restenosis in the right coronary artery and proximal LAD disease.  Underwent CABG on 11/20.  Per CT Surgery   # ESRD on PD: CCPD at night, 5 exchanges, 2-2.5 L fill volume, mix of 1.5 and 2.5% solution.  He does have left upper extremity AV fistula but likely not fully functional. Patient was complaining of abdominal distention and difficulty with 2.5 L of fill volume on 11/17.  We lowered fill volume to 2 L and he tolerated well.  Held RRT on 11/20 but then started CRRT on 11/21.  Temporary dialysis catheter placed by CCM.  PD catheter flushed 11/26 am - Temporary dialysis catheter is in place - would retain for now to see how he does with the PD  - Resume PD tonight - mix of 1.5 and 2.5% dextrose     # Hypertension/Shock: with post-op hypotension related surgery/shock. Pressors per primary - now off.  Note patient on midodrine    # Anemia of ESRD: Iron saturation 27 with high ferritin.  Has been on aranesp 150 mcg every Friday.  Received transfusion with surgery per charting  #CKD-MBD:  Continue calcitriol.  Remain off of sevelamer in the setting of recent CRRT - trend before resuming  Disposition - continue inpatient monitoring   -------------------------------  Subjective:  CRRT was stopped on 11/26 after clotting and per MD order was not  restarted.  He had 252 mL UF over 11/26 with CRRT before stopping.  No urine output charted over 11/26. His RN has had him for the past several nights and he looks better tonight.  BP stabilized and he came off of levo yesterday.  Pt normally does mix of 1.5 and 2.5% dextrose at home.  His AVF has never been used  Review of systems:  Denies shortness of breath or chest pain  Denies n/v  Objective Vital signs in last 24 hours: Vitals:   06/12/22 0300 06/12/22 0354 06/12/22 0400 06/12/22 0500  BP: 95/80   (!) 98/59  Pulse:      Resp: (!) 33   20  Temp:   98.1 F (36.7 C)   TempSrc:   Oral   SpO2: 96%  92%   Weight:  104.1 kg    Height:       Weight change: 1.2 kg  Intake/Output Summary (Last 24 hours) at 06/12/2022 0605 Last data filed at 06/11/2022 1930 Gross per 24 hour  Intake 951.64 ml  Output 302.6 ml  Net 649.04 ml       Labs: RENAL PANEL Recent Labs    06/03/22 0648 06/05/22 0539 06/05/22 1857 06/06/22 0006 06/06/22 0303 06/06/22 6045 06/06/22 1651 06/07/22 0344 06/07/22 0612 06/07/22 1613 06/07/22 2136 06/08/22 0415 06/08/22 1510 06/09/22 0405 06/09/22 1602 06/10/22 0421 06/10/22 1600 06/11/22 0417 06/11/22 1642 06/12/22 0341  NA 137   < >  137   < > 135   < > 136  135 137   < > '137 136 139 138 140 139 136 136 136 139 '$ 138  K 4.1   < > 5.3*   < > 6.1*   < > 5.2*  5.2* 4.2   < > 4.5 4.4 4.5 4.3 4.1 4.1 4.1 4.0 4.0 4.3 4.0  CL 98   < > 100  --  100  --  101  101 102  --  101  --  103 102 102 103 101 104 102 104 98  CO2 25   < > 22  --  21*  --  22  22 20*  --  23  --  '24 24 26 24 25 26 26 26 23  '$ GLUCOSE 127*   < > 148*  --  132*  --  129*  130* 134*  --  133*  --  126* 129* 105* 109* 98 132* 117* 156* 148*  BUN 50*   < > 48*  --  49*  --  47*  47* 30*  --  23  --  '19 18 15 15 12 11 10 12 17  '$ CREATININE 20.40*   < > 19.52*  --  19.02*  --  16.63*  16.95* 11.40*  --  8.11*  --  6.08* 5.10* 4.30* 3.67* 3.09* 2.99* 2.98* 3.65* 5.23*  CALCIUM 9.2    < > 7.9*  --  8.0*  --  7.7*  7.6* 7.8*  --  8.3*  --  8.6* 8.5* 8.5* 8.0* 8.0* 8.0* 7.9* 8.4* 8.6*  MG 2.1  --  1.9  --  2.0  --  2.0 2.0  --   --   --  2.4  --  2.4  --  2.5*  --  2.4  --  2.5*  PHOS 4.0   < >  --   --   --   --  4.6 3.7  --  3.6  --  2.8 2.5 2.2* 4.3 2.4* 2.7 2.4* 2.3* 2.5  ALBUMIN 2.7*   < >  --   --   --   --  2.7* 2.7*  --  2.7*  --  2.5* 2.4* 2.2* 2.3* 2.2* 2.2* 2.2* 2.4* 2.3*   < > = values in this interval not displayed.     Liver Function Tests: Recent Labs  Lab 06/11/22 0417 06/11/22 1642 06/12/22 0341  ALBUMIN 2.2* 2.4* 2.3*   No results for input(s): "LIPASE", "AMYLASE" in the last 168 hours. No results for input(s): "AMMONIA" in the last 168 hours. CBC: Recent Labs    06/02/22 0312 06/03/22 0648 06/09/22 0405 06/09/22 1601 06/10/22 0421 06/11/22 0417 06/12/22 0342  HGB 8.5*   < > 7.2* 8.3* 8.0* 8.0* 7.7*  MCV 95.4   < > 98.8 95.6 96.6 97.0 99.6  FERRITIN 1,105*  --   --   --   --   --   --   TIBC 293  --   --   --   --   --   --   IRON 79  --   --   --   --   --   --    < > = values in this interval not displayed.    Cardiac Enzymes: No results for input(s): "CKTOTAL", "CKMB", "CKMBINDEX", "TROPONINI" in the last 168 hours. CBG: Recent Labs  Lab 06/11/22 0759 06/11/22 1144 06/11/22 1147 06/11/22 1632 06/11/22 2113  GLUCAP 114* 67* 146* 121* 148*    Iron Studies: No results for input(s): "IRON", "TIBC", "TRANSFERRIN", "FERRITIN" in the last 72 hours. Studies/Results: No results found.  Medications: Infusions:   prismasol BGK 4/2.5 Stopped (06/11/22 1300)    prismasol BGK 4/2.5 Stopped (06/11/22 1300)   sodium chloride     sodium chloride Stopped (06/11/22 0954)   dialysis solution 1.5% low-MG/low-CA     norepinephrine (LEVOPHED) Adult infusion Stopped (06/11/22 0954)   prismasol BGK 4/2.5 Stopped (06/11/22 1300)    Scheduled Medications:  aspirin EC  325 mg Oral Daily   Or   aspirin  324 mg Per Tube Daily   atorvastatin   80 mg Oral Daily   bisacodyl  10 mg Oral Daily   Or   bisacodyl  10 mg Rectal Daily   Chlorhexidine Gluconate Cloth  6 each Topical Daily   darbepoetin (ARANESP) injection - DIALYSIS  150 mcg Subcutaneous Q Fri-1800   docusate sodium  200 mg Oral BID   feeding supplement (NEPRO CARB STEADY)  237 mL Oral Q1400   gentamicin cream  1 Application Topical Daily   heparin injection (subcutaneous)  5,000 Units Subcutaneous Q8H   insulin aspart  0-24 Units Subcutaneous TID AC & HS   insulin detemir  8 Units Subcutaneous Daily   melatonin  3 mg Oral QHS   midodrine  15 mg Oral TID WC   pantoprazole  40 mg Oral Daily   polyethylene glycol  17 g Oral Daily   QUEtiapine  25 mg Oral QHS   senna  2 tablet Oral Daily   sodium chloride flush  3 mL Intravenous Q12H    have reviewed scheduled and prn medications.  Physical Exam: General adult male in bed in no acute distress HEENT normocephalic atraumatic extraocular movements intact sclera anicteric Neck supple trachea midline Lungs clear to auscultation bilaterally normal work of breathing at rest on room air Heart S1S2 Abdomen soft nontender distended and obese habitus Extremities no edema  Psych normal mood and affect Neuro - alert and oriented x 3 provides hx and follows commands Access PD catheter is dressed; AVF with bruit?pulse - seems small; left IJ nontunneled HD catheter    Marlane Hatcher Kurstyn Larios 06/12/2022,6:05 AM  LOS: 13 days

## 2022-06-12 NOTE — Progress Notes (Deleted)
Pt refused CPAP, says he will let RT know if he feels like he needs it throughout the night. RT will continue to monitor, RN aware.

## 2022-06-12 NOTE — Progress Notes (Signed)
Rounding Note    Patient Name: [name] [name] Date of Encounter: 06/12/2022  [place] Cardiologist: Pixie Casino, MD    Subjective    71 yo with hx of CAD , NSTEMI, s/p CABG, ESRD   Remains hypotensive  Has pain with urination Had scrotal pain and penis pain over the weekend   No CP    Inpatient Medications    Scheduled Meds:  aspirin EC  325 mg Oral Daily   Or   aspirin  324 mg Per Tube Daily   atorvastatin  80 mg Oral Daily   bisacodyl  10 mg Oral Daily   Or   bisacodyl  10 mg Rectal Daily   Chlorhexidine Gluconate Cloth  6 each Topical Daily   darbepoetin (ARANESP) injection - DIALYSIS  150 mcg Subcutaneous Q Fri-1800   docusate sodium  200 mg Oral BID   feeding supplement (NEPRO CARB STEADY)  237 mL Oral Q1400   gentamicin cream  1 Application Topical Daily   heparin injection (subcutaneous)  5,000 Units Subcutaneous Q8H   insulin aspart  0-24 Units Subcutaneous TID AC & HS   insulin detemir  8 Units Subcutaneous Daily   melatonin  3 mg Oral QHS   midodrine  15 mg Oral TID WC   pantoprazole  40 mg Oral Daily   polyethylene glycol  17 g Oral Daily   QUEtiapine  25 mg Oral QHS   senna  2 tablet Oral Daily   sodium chloride flush  3 mL Intravenous Q12H   Continuous Infusions:  sodium chloride     sodium chloride Stopped (06/11/22 0954)   dialysis solution 1.5% low-MG/low-CA     norepinephrine (LEVOPHED) Adult infusion Stopped (06/11/22 0954)   PRN Meds: sodium chloride, albuterol, morphine injection, ondansetron (ZOFRAN) IV, mouth rinse, mouth rinse, oxyCODONE, traMADol   Vital Signs    Vitals:   06/12/22 0354 06/12/22 0400 06/12/22 0500 06/12/22 0731  BP:   (!) 98/59   Pulse:      Resp:   20   Temp:  98.1 F (36.7 C)  98.9 F (37.2 C)  TempSrc:  Oral  Oral  SpO2:  92%    Weight: 104.1 kg     Height:        Intake/Output Summary (Last 24 hours) at 06/12/2022 0813 Last data filed at 06/11/2022 1930 Gross per 24 hour  Intake  927.91 ml  Output 200.6 ml  Net 727.31 ml       06/12/2022    3:54 AM 06/11/2022    5:00 AM 06/10/2022    5:00 AM  Last 3 Weights  Weight (lbs) 229 lb 8 oz 226 lb 13.7 oz 231 lb 7.7 oz  Weight (kg) 104.1 kg 102.9 kg 105 kg      Telemetry    V pacing - Personally Reviewed  ECG     - Personally Reviewed  Physical Exam   Physical Exam: Blood pressure (!) 98/59, pulse 94, temperature 98.9 F (37.2 C), temperature source Oral, resp. rate 20, height '5\' 4"'$  (1.626 m), weight 104.1 kg, SpO2 92 %.       GEN:  elderly male,   still complains of pain in his penis   HEENT: Normal NECK: No JVD; No carotid bruits LYMPHATICS: No lymphadenopathy CARDIAC: RRR , no murmurs, rubs, gallops RESPIRATORY:  Clear to auscultation without rales, wheezing or rhonchi  ABDOMEN: Soft, non-tender, non-distended MUSCULOSKELETAL:  No edema; No deformity  SKIN: Warm and dry NEUROLOGIC:  Alert and oriented x 3  Labs    High Sensitivity Troponin:   Recent Labs  Lab 05/30/22 0341 05/30/22 0640 05/30/22 1148  TROPONINIHS 100* 142* 330*      Chemistry Recent Labs  Lab 06/10/22 0421 06/10/22 1600 06/11/22 0417 06/11/22 1642 06/12/22 0341  NA 136   < > 136 139 138  K 4.1   < > 4.0 4.3 4.0  CL 101   < > 102 104 98  CO2 25   < > '26 26 23  '$ GLUCOSE 98   < > 117* 156* 148*  BUN 12   < > '10 12 17  '$ CREATININE 3.09*   < > 2.98* 3.65* 5.23*  CALCIUM 8.0*   < > 7.9* 8.4* 8.6*  MG 2.5*  --  2.4  --  2.5*  ALBUMIN 2.2*   < > 2.2* 2.4* 2.3*  GFRNONAA 21*   < > 22* 17* 11*  ANIONGAP 10   < > 8 9 17*   < > = values in this interval not displayed.     Lipids No results for input(s): "CHOL", "TRIG", "HDL", "LABVLDL", "LDLCALC", "CHOLHDL" in the last 168 hours.  Hematology Recent Labs  Lab 06/10/22 0421 06/11/22 0417 06/12/22 0342  WBC 4.7 6.4 8.3  RBC 2.65* 2.63* 2.51*  HGB 8.0* 8.0* 7.7*  HCT 25.6* 25.5* 25.0*  MCV 96.6 97.0 99.6  MCH 30.2 30.4 30.7  MCHC 31.3 31.4 30.8  RDW 16.3*  16.5* 16.7*  PLT 145* 160 167    Thyroid No results for input(s): "TSH", "FREET4" in the last 168 hours.  BNPNo results for input(s): "BNP", "PROBNP" in the last 168 hours.  DDimer No results for input(s): "DDIMER" in the last 168 hours.   Radiology    No results found.  Cardiac Studies      Patient Profile     71 y.o. male    Assessment & Plan         CAD :   Status post bypass grafting.  He is progressing very slowly.  2.  End-stage renal disease: plan is for peritoneal dialysis  .  3.  Bradycardia: Currently pacing. HR is 78 this am   4.   Hypotension:  getting midodrine  Off levophed   5.  Dysuria:   getting a UA . Will make further plans pending the results of the UA         For questions or updates, please contact [place] Please consult [email or web address] for contact info under        Signed, Mertie Moores, MD  06/12/2022, 8:13 AM

## 2022-06-13 DIAGNOSIS — I214 Non-ST elevation (NSTEMI) myocardial infarction: Secondary | ICD-10-CM | POA: Diagnosis not present

## 2022-06-13 DIAGNOSIS — D631 Anemia in chronic kidney disease: Secondary | ICD-10-CM

## 2022-06-13 DIAGNOSIS — N186 End stage renal disease: Secondary | ICD-10-CM | POA: Diagnosis not present

## 2022-06-13 DIAGNOSIS — R5381 Other malaise: Secondary | ICD-10-CM | POA: Diagnosis not present

## 2022-06-13 DIAGNOSIS — I441 Atrioventricular block, second degree: Secondary | ICD-10-CM | POA: Diagnosis not present

## 2022-06-13 LAB — CBC
HCT: 26.8 % — ABNORMAL LOW (ref 39.0–52.0)
Hemoglobin: 7.8 g/dL — ABNORMAL LOW (ref 13.0–17.0)
MCH: 30 pg (ref 26.0–34.0)
MCHC: 29.1 g/dL — ABNORMAL LOW (ref 30.0–36.0)
MCV: 103.1 fL — ABNORMAL HIGH (ref 80.0–100.0)
Platelets: 178 10*3/uL (ref 150–400)
RBC: 2.6 MIL/uL — ABNORMAL LOW (ref 4.22–5.81)
RDW: 17.2 % — ABNORMAL HIGH (ref 11.5–15.5)
WBC: 7.4 10*3/uL (ref 4.0–10.5)
nRBC: 4.9 % — ABNORMAL HIGH (ref 0.0–0.2)

## 2022-06-13 LAB — RENAL FUNCTION PANEL
Albumin: 2.5 g/dL — ABNORMAL LOW (ref 3.5–5.0)
Anion gap: 10 (ref 5–15)
BUN: 21 mg/dL (ref 8–23)
CO2: 26 mmol/L (ref 22–32)
Calcium: 8.6 mg/dL — ABNORMAL LOW (ref 8.9–10.3)
Chloride: 100 mmol/L (ref 98–111)
Creatinine, Ser: 7.16 mg/dL — ABNORMAL HIGH (ref 0.61–1.24)
GFR, Estimated: 8 mL/min — ABNORMAL LOW (ref >60)
Glucose, Bld: 173 mg/dL — ABNORMAL HIGH (ref 70–99)
Phosphorus: 3.8 mg/dL (ref 2.5–4.6)
Potassium: 3.8 mmol/L (ref 3.5–5.1)
Sodium: 136 mmol/L (ref 135–145)

## 2022-06-13 LAB — GLUCOSE, CAPILLARY
Glucose-Capillary: 121 mg/dL — ABNORMAL HIGH (ref 70–99)
Glucose-Capillary: 132 mg/dL — ABNORMAL HIGH (ref 70–99)
Glucose-Capillary: 131 mg/dL — ABNORMAL HIGH (ref 70–99)
Glucose-Capillary: 120 mg/dL — ABNORMAL HIGH (ref 70–99)
Glucose-Capillary: 146 mg/dL — ABNORMAL HIGH (ref 70–99)

## 2022-06-13 LAB — HIV ANTIBODY (ROUTINE TESTING W REFLEX): HIV Screen 4th Generation wRfx: NONREACTIVE

## 2022-06-13 LAB — PATHOLOGIST SMEAR REVIEW

## 2022-06-13 LAB — MAGNESIUM: Magnesium: 2.6 mg/dL — ABNORMAL HIGH (ref 1.7–2.4)

## 2022-06-13 MED ORDER — DELFLEX-LC/1.5% DEXTROSE 344 MOSM/L IP SOLN: INTRAPERITONEAL | Status: DC

## 2022-06-13 MED ORDER — OXYBUTYNIN CHLORIDE 5 MG PO TABS
5.0000 mg | ORAL_TABLET | Freq: Three times a day (TID) | ORAL | Status: DC
  Administered 2022-06-13 – 2022-06-19 (×18): 5 mg via ORAL
  Filled 2022-06-13 (×20): qty 1

## 2022-06-13 NOTE — Progress Notes (Signed)
[name]       [name] [address]             [phone]                 8 Days Post-Op Procedure(s) (LRB): OFF PUMP CORONARY ARTERY BYPASS GRAFTING (CABG) X 2 BYPASSES USING LEFT INTERNAL MAMMARY ARTERY AND RIGHT LEG GREATER SAPHENOUS VEIN HARVESTED ENDOSCOPICALLY (N/A) TRANSESOPHAGEAL ECHOCARDIOGRAM (TEE) (N/A)   Events: No events Tolerated peritoneal dialysis overnight _______________________________________________________________ Vitals: BP 104/72 (BP Location: Right Arm)   Pulse 89   Temp 98.7 F (37.1 C) (Oral)   Resp 19   Ht '5\' 4"'$  (1.626 m)   Wt 105.8 kg   SpO2 99%   BMI 40.04 kg/m  Filed Weights   06/11/22 0500 06/12/22 0354 06/13/22 0500  Weight: 102.9 kg 104.1 kg 105.8 kg     - Neuro: alert NAD  - Cardiovascular: sinus in 70s  Drips: none.   CVP:  [5 mmHg-35 mmHg] 16 mmHg  - Pulm: EWOb    ABG    Component Value Date/Time   PHART 7.372 06/07/2022 2136   PCO2ART 45.6 06/07/2022 2136   PO2ART 156 (H) 06/07/2022 2136   HCO3 26.4 06/07/2022 2136   TCO2 28 06/07/2022 2136   ACIDBASEDEF 2.0 06/07/2022 0630   O2SAT 99 06/07/2022 2136    - Abd: ND - Extremity: warm  .Intake/Output      11/27 0701 11/28 0700 11/28 0701 11/29 0700   P.O. 840 240   I.V. (mL/kg) 10 (0.1)    Other  648   Total Intake(mL/kg) 850 (8) 888 (8.4)   Urine (mL/kg/hr) 0 (0)    Stool 1    CRRT     Total Output 1    Net +849 +888        Stool Occurrence 1 x 1 x      _______________________________________________________________ Labs:    Latest Ref Rng & Units 06/13/2022    3:23 AM 06/12/2022    3:42 AM 06/11/2022    4:17 AM  CBC  WBC 4.0 - 10.5 K/uL 7.4  8.3  6.4   Hemoglobin 13.0 - 17.0 g/dL 7.8  7.7  8.0   Hematocrit 39.0 - 52.0 % 26.8  25.0  25.5   Platelets 150 - 400 K/uL 178  167  160       Latest Ref Rng & Units 06/13/2022    3:23 AM 06/12/2022    3:41 AM 06/11/2022    4:42 PM  CMP  Glucose 70 - 99 mg/dL 173  148  156    BUN 8 - 23 mg/dL '21  17  12   '$ Creatinine 0.61 - 1.24 mg/dL 7.16  5.23  3.65   Sodium 135 - 145 mmol/L 136  138  139   Potassium 3.5 - 5.1 mmol/L 3.8  4.0  4.3   Chloride 98 - 111 mmol/L 100  98  104   CO2 22 - 32 mmol/L '26  23  26   '$ Calcium 8.9 - 10.3 mg/dL 8.6  8.6  8.4     CXR: -  _______________________________________________________________  Assessment and Plan: POD 8 s/p CABG  Neuro: pain controlled CV: off gtts.  Holding BB for 2nd degree block.  Temp wires in place.  No urgent need for PPM as of now.   Pulm: IS, ambulation  Renal: possible peritoneal dialysis today.   GI: on diet Heme: stable.  Will start plavix  once wires remove, and PPM placed ID: [email or web address] Endo: SSI Dispo: continue ICU care for now given likelihood of PPM placement   [name] [name] O [name] [name] 06/13/2022 10:42 AM

## 2022-06-13 NOTE — Progress Notes (Addendum)
[facility] NEPHROLOGY PROGRESS NOTE  Assessment/ Plan: Pt is a 71 y.o. yo male   with past medical history significant for OSA, COPD, type II DM, CAD, ESRD on PD, anemia who was presented with chest pain, and NSTEMI, seen as a consultation for the management of ESRD.   Dialysis Orders from previous admission in 01/2022.: [facility] therapies patient.  CCPD, 5 exchanges 3 liter fill volumes, 10 min fill time, 1 hour 20 minutes dwell, 20 minute drain.  Last fill Icodextran.  EDW 103 kg. Uses combination of 1.5% and 2.5% depending upon BP and weight    #NSTEMI/unstable angina: Cardiac cath on 11/14 with severe in in-stent restenosis in the right coronary artery and proximal LAD disease.  Underwent CABG on 11/20.  Per CT Surgery   # ESRD on PD: CCPD at night, 5 exchanges, 2-2.5 L fill volume, mix of 1.5 and 2.5% solution.  He does have left upper extremity AV fistula but likely not fully functional. Patient was complaining of abdominal distention and difficulty with 2.5 L of fill volume on 11/17.  We lowered fill volume to 2 L and he tolerated well.  Held RRT on 11/20 but then started CRRT on 11/21.  Temporary dialysis catheter placed by CCM.  PD catheter flushed 11/26 am ------------------ - Temporary dialysis catheter is in place - would retain for now to see how he does with the PD especially in light of hypotension   - Continue PD tonight - transition to all 2.5% dextrose    # Hypotension: Pressors per primary - now off.  Note patient on midodrine    # Anemia of ESRD: Iron saturation 27 with high ferritin.  Has been on aranesp 150 mcg every Friday.  Received transfusion with surgery per charting  #CKD-MBD:  Continue calcitriol.  Remain off of sevelamer for now as phos lower after CRRT.  Anticipate will need to resume  # heart block - awaiting EP recs; per cardiology and CTS  # Dysuria - note UA was ordered not yet obtained and pt doesn't make much urine; pt  discomfort improved   Disposition - continue inpatient monitoring   -------------------------------  Subjective:  CRRT was stopped on 11/26 after clotting and per MD order was not restarted.  We ordered PD for 11/27 overnight and he has had 648 mL UF thus far (still on the machine).  He states normally doesn't get a whole lot off with PD but he's sleepy.  No urine output is charted over 11/27.  He makes a few drops of urine at home and is normally on torsemide - not resuming if this is his baseline.  Per report pt with burning sensation yesterday/dysuria but no actual urine output; she states that he felt better after in/out cath (which yielded no urine).    Review of systems:   Denies shortness of breath or chest pain  Denies n/v  Objective Vital signs in last 24 hours: Vitals:   06/13/22 0300 06/13/22 0400 06/13/22 0407 06/13/22 0500  BP: (!) 87/58 (!) 89/53  (!) 85/61  Pulse: 60 (!) 54 63 68  Resp: 14 15 (!) 22 16  Temp:      TempSrc:      SpO2: 100% (!) 89% 100% 100%  Weight:      Height:       Weight change:   Intake/Output Summary (Last 24 hours) at 06/13/2022 0545 Last data filed at 06/12/2022 2000 Gross per 24 hour  Intake 850 ml  Output 1 ml  Net 849 ml       Labs: RENAL PANEL Recent Labs    06/05/22 1857 06/06/22 0006 06/06/22 0303 06/06/22 5009 06/06/22 1651 06/07/22 0344 06/07/22 0612 06/08/22 0415 06/08/22 1510 06/09/22 0405 06/09/22 1602 06/10/22 0421 06/10/22 1600 06/11/22 0417 06/11/22 1642 06/12/22 0341 06/13/22 0323  NA 137   < > 135   < > 136  135 137   < > '139 138 140 139 136 136 136 139 138 136 '$  K 5.3*   < > 6.1*   < > 5.2*  5.2* 4.2   < > 4.5 4.3 4.1 4.1 4.1 4.0 4.0 4.3 4.0 3.8  CL 100  --  100  --  101  101 102   < > '103 102 102 103 101 104 102 104 98 100 '$  CO2 22  --  21*  --  22  22 20*   < > '24 24 26 24 25 26 26 26 23 26  '$ GLUCOSE 148*  --  132*  --  129*  130* 134*   < > 126* 129* 105* 109* 98 132* 117* 156* 148* 173*  BUN  48*  --  49*  --  47*  47* 30*   < > '19 18 15 15 12 11 10 12 17 21  '$ CREATININE 19.52*  --  19.02*  --  16.63*  16.95* 11.40*   < > 6.08* 5.10* 4.30* 3.67* 3.09* 2.99* 2.98* 3.65* 5.23* 7.16*  CALCIUM 7.9*  --  8.0*  --  7.7*  7.6* 7.8*   < > 8.6* 8.5* 8.5* 8.0* 8.0* 8.0* 7.9* 8.4* 8.6* 8.6*  MG 1.9  --  2.0  --  2.0 2.0  --  2.4  --  2.4  --  2.5*  --  2.4  --  2.5* 2.6*  PHOS  --   --   --   --  4.6 3.7   < > 2.8 2.5 2.2* 4.3 2.4* 2.7 2.4* 2.3* 2.5 3.8  ALBUMIN  --   --   --   --  2.7* 2.7*   < > 2.5* 2.4* 2.2* 2.3* 2.2* 2.2* 2.2* 2.4* 2.3* 2.5*   < > = values in this interval not displayed.     Liver Function Tests: Recent Labs  Lab 06/11/22 1642 06/12/22 0341 06/13/22 0323  ALBUMIN 2.4* 2.3* 2.5*   No results for input(s): "LIPASE", "AMYLASE" in the last 168 hours. No results for input(s): "AMMONIA" in the last 168 hours. CBC: Recent Labs    06/02/22 0312 06/03/22 0648 06/09/22 1601 06/10/22 0421 06/11/22 0417 06/12/22 0342 06/13/22 0323  HGB 8.5*   < > 8.3* 8.0* 8.0* 7.7* 7.8*  MCV 95.4   < > 95.6 96.6 97.0 99.6 103.1*  FERRITIN 1,105*  --   --   --   --   --   --   TIBC 293  --   --   --   --   --   --   IRON 79  --   --   --   --   --   --    < > = values in this interval not displayed.    Cardiac Enzymes: No results for input(s): "CKTOTAL", "CKMB", "CKMBINDEX", "TROPONINI" in the last 168 hours. CBG: Recent Labs  Lab 06/11/22 2113 06/12/22 0733 06/12/22 1116 06/12/22 1559 06/12/22 2205  GLUCAP 148* 147* 132* 113* 172*    Iron Studies: No results for input(s): "  IRON", "TIBC", "TRANSFERRIN", "FERRITIN" in the last 72 hours. Studies/Results: No results found.  Medications: Infusions:  sodium chloride     sodium chloride Stopped (06/11/22 0954)   dialysis solution 1.5% low-MG/low-CA     norepinephrine (LEVOPHED) Adult infusion Stopped (06/11/22 0954)    Scheduled Medications:  aspirin EC  325 mg Oral Daily   atorvastatin  80 mg Oral Daily    bisacodyl  10 mg Oral Daily   Or   bisacodyl  10 mg Rectal Daily   Chlorhexidine Gluconate Cloth  6 each Topical Daily   darbepoetin (ARANESP) injection - DIALYSIS  150 mcg Subcutaneous Q Fri-1800   docusate sodium  200 mg Oral BID   feeding supplement (NEPRO CARB STEADY)  237 mL Oral Q1400   gentamicin cream  1 Application Topical Daily   heparin injection (subcutaneous)  5,000 Units Subcutaneous Q8H   insulin aspart  0-5 Units Subcutaneous QHS   insulin aspart  0-9 Units Subcutaneous TID WC   insulin detemir  8 Units Subcutaneous Daily   melatonin  3 mg Oral QHS   midodrine  15 mg Oral TID WC   pantoprazole  40 mg Oral Daily   polyethylene glycol  17 g Oral Daily   QUEtiapine  25 mg Oral QHS   senna  2 tablet Oral Daily   sodium chloride flush  3 mL Intravenous Q12H    have reviewed scheduled and prn medications.  Physical Exam:   General adult male in bed in no acute distress HEENT normocephalic atraumatic extraocular movements intact sclera anicteric Neck supple trachea midline Lungs clear to auscultation bilaterally normal work of breathing at rest on low rate of supp oxygen Heart S1S2 Abdomen soft nontender distended and obese habitus Extremities 1+ lower extremity edema  Psych normal mood and affect Neuro - alert and oriented x 3 provides hx and follows commands Access PD catheter in place; AVF with bruit - seems small and no thrill; left IJ nontunneled HD catheter    Claudia Desanctis 06/13/2022,6:04 AM  LOS: 14 days

## 2022-06-13 NOTE — Progress Notes (Signed)
Patient ID: [name] [name], male   DOB: [birth year], 71 y.o.   MRN: [id number]  TCTS Evening Rounds:  Hemodynamically stable.   Intermittently pacing.  Sats 92%.   Continuing PD tonight per nephrology.  Ambulating.

## 2022-06-13 NOTE — Procedures (Signed)
  PD post treatment note:   PD treatment completed. Patient tolerated treatment well. PD effluent is clear. No specimen collected.   PD exit site clean, dry and intact. Patient is awake, oriented and in no acute distress.  Report given to bedside nurse.   See flow sheet for details

## 2022-06-13 NOTE — Progress Notes (Signed)
Pt refused CPAP

## 2022-06-13 NOTE — Progress Notes (Signed)
Pt refused CPAP at this time. RN aware, will continue to monitor as needed.

## 2022-06-13 NOTE — TOC Progression Note (Signed)
Transition of Care [facility]) - Progression Note    Patient Details  Name: [name] [name] MRN: [id number] Date of Birth: [birth year]  Transition of [facility]) CM/SW Contact  Graves-Bigelow, Ocie Cornfield, RN Phone Number: 06/13/2022, 2:39 PM  Clinical Narrative:  Patient was discussed in morning rounds. POD-8 CABG- Nephrology following for PD. Case Manager will continue to follow for transition of care needs as the patient progresses.   Expected Discharge Plan: [place] Barriers to Discharge: Continued Medical Work up  Expected Discharge Plan and Services Expected Discharge Plan: [place] In-house Referral: NA Discharge Planning Services: CM Consult   Living arrangements for the past 2 months: Single Family Home                   DME Agency: NA  Readmission Risk Interventions    06/01/2022   11:44 AM 01/20/2022    3:18 PM  Readmission Risk Prevention Plan  Transportation Screening Complete Complete  PCP or Specialist Appt within 3-5 Days  Complete  HRI or Home Care Consult Complete Complete  Social Work Consult for [place] Planning/Counseling Complete Complete  Palliative Care Screening Not Applicable Not Applicable  Medication Review Press photographer) Referral to Pharmacy Complete

## 2022-06-13 NOTE — Progress Notes (Signed)
NAME:  [name] [name], MRN:  [id number], DOB:  Sep 16, 1950, LOS: 47 ADMISSION DATE:  05/30/2022, CONSULTATION DATE: 06/05/2022 REFERRING MD: Dr. Kipp Brood, CHIEF COMPLAINT: S/p CABG  History of Present Illness:  71 year old male with end-stage renal disease on peritoneal dialysis, diabetes type 2, hypertension, hyperlipidemia, coronary artery disease, OSA and COPD, who was admitted with unstable angina, on work-up noted to have multivessel coronary artery disease underwent CABG x2, postop requiring vasopressor support and remain on full mechanical ventilatory support. Received 2 units of PRBCs in OR PCCM was consulted for help evaluation medical management  Pertinent  Medical History   Past Medical History:  Diagnosis Date   Anemia    Arthritis    Asthma    Bell palsy    CAD (coronary artery disease)    a. 2014 MV: abnl w/ infap ischemia; b. 03/2013 Cath: aneurysmal bleb in the LAD w/ otw nonobs dzs-->Med Rx.   Chronic back pain    Chronic knee pain    a. 09/2015 s/p R TKA.   Chronic pain    Chronic shoulder pain    Chronic sinusitis    COPD (chronic obstructive pulmonary disease) ([place])    Diabetes mellitus without complication ([place])    type II    ESRD on peritoneal dialysis ([place])    on peritoneal dialysis, [facility]   Essential hypertension    GERD (gastroesophageal reflux disease)    Gout    Gout    Hepatomegaly    noted on noncontrast CT 2015   History of hiatal hernia    Hyperlipidemia    Lateral meniscus tear    Obesity    Truncal   Obstructive sleep apnea    does not use cpap    On home oxygen therapy    uses 2l when is going somewhere per patient    PUD (peptic ulcer disease)    remote, reports f/u EGD about 8 years ago unremarkable    Reactive airway disease    related to exposure to chemical during 9/11   Sinusitis    Vitamin D deficiency      Significant Hospital Events: Including procedures, antibiotic start and stop dates in addition to other  pertinent events     Interim History / Subjective:  He denies SOB or CP. Feels well overall.  Objective   Blood pressure (!) 86/51, pulse 60, temperature 98.6 F (37 C), temperature source Oral, resp. rate (!) 21, height '5\' 4"'$  (1.626 m), weight 105.8 kg, SpO2 91 %. CVP:  [4 mmHg-35 mmHg] 4 mmHg      Intake/Output Summary (Last 24 hours) at 06/13/2022 1208 Last data filed at 06/13/2022 1109 Gross per 24 hour  Intake 1488 ml  Output 1 ml  Net 1487 ml    Filed Weights   06/11/22 0500 06/12/22 0354 06/13/22 0500  Weight: 102.9 kg 104.1 kg 105.8 kg    Examination: General: Chronically ill-appearing man sitting up in bed no acute distress, watching TV Neuro: Awake and alert, answering questions appropriately, moving all extremities.  Answering questions appropriately. HEENT: [place]/AT, eyes anicteric Cardiovascular: 2, regular rate and rhythm.  Intermittently requiring pacing Lungs: Breathing comfortably on room air, CTAB. Abdomen: Obese, soft, nontender Musculoskeletal: Pitting edema bilateral lower extremities, Skin: Warm, dry, no diffuse rashes  BUN 21 Cr 7.16 WBC 7.4 H/H 7.8/26.8 Platelets 178  Resolved Hospital Problem list   Cardiogenic shock, resolved Acute hypoxic respiratory failure due to pulmonary edema, currently on nasal cannula oxygen Acute delirium, improved Hyperkalemia,  resolved  Assessment & Plan:   Acute NSTEMI - s/p 2-vessel CABG 11/20 -Postop care per T CTS - Continue aspirin, statin,  -Metoprolol on hold due to heart block.   Mobitz type II heart block Hx HTN, HLD -Temporary pacing, continue percutaneous wires -Continue ICU while intermittently requiring pacing.  Backup pacing rate at 50. - Appreciate EP's management.  Needs dialysis catheter removed prior to being able to place pacemaker.  Hx OSA/OHS - on nocturnal CPAP. - Continue nocturnal CPAP.  ESRD on PD -Appreciate nephrology's management, transitioning to PD.  If this is successful  hopefully we can get his dialysis catheter out in the coming days. -Continue midodrine  Diabetes type 2 -Continue Levemir 8 units daily - Sliding scale insulin as needed - Goal blood glucose 140-180  Acute blood loss anemia.,  Anemia of end-stage renal disease - Transfuse for hemoglobin less than 7 or hemodynamically significant bleeding.  Deconditioning - PT, OT  Not addressed today: Penis pain - unclear etiology. No clear wounds/lesions/rash, no discharge. - Send UA if able (attempted in/out cath but no urine obtained). - Supportive care.   Best Practice (right click and "Reselect all SmartList Selections" daily)  Per primary team.    Julian Hy, DO 06/13/22 12:10 PM [facility]  For contact information, see Amion. If no response to pager, please call PCCM consult pager. After hours, 7PM- 7AM, please call Elink.

## 2022-06-13 NOTE — Progress Notes (Addendum)
  Pacing turned down to VVI 50.   This am pt with Second Degree Mobitz 1, [place]enckebach with intermittent pacing after a missed beat.   EP will continue to follow at a distance. Pt continues to have multiple indwelling catheters, and with improving conduction, no current urgent indication for pacing.  Reviewed with Dr. Georgiana Shore "[address] [place], [place]  06/13/2022 10:40 AM

## 2022-06-14 ENCOUNTER — Inpatient Hospital Stay (HOSPITAL_COMMUNITY): Payer: Medicare Other

## 2022-06-14 DIAGNOSIS — I441 Atrioventricular block, second degree: Secondary | ICD-10-CM | POA: Diagnosis not present

## 2022-06-14 DIAGNOSIS — Z992 Dependence on renal dialysis: Secondary | ICD-10-CM

## 2022-06-14 DIAGNOSIS — N186 End stage renal disease: Secondary | ICD-10-CM | POA: Diagnosis not present

## 2022-06-14 DIAGNOSIS — R739 Hyperglycemia, unspecified: Secondary | ICD-10-CM | POA: Diagnosis not present

## 2022-06-14 LAB — GLUCOSE, CAPILLARY
Glucose-Capillary: 134 mg/dL — ABNORMAL HIGH (ref 70–99)
Glucose-Capillary: 121 mg/dL — ABNORMAL HIGH (ref 70–99)
Glucose-Capillary: 115 mg/dL — ABNORMAL HIGH (ref 70–99)
Glucose-Capillary: 128 mg/dL — ABNORMAL HIGH (ref 70–99)
Glucose-Capillary: 171 mg/dL — ABNORMAL HIGH (ref 70–99)

## 2022-06-14 LAB — RENAL FUNCTION PANEL
Albumin: 2.2 g/dL — ABNORMAL LOW (ref 3.5–5.0)
Anion gap: 10 (ref 5–15)
BUN: 26 mg/dL — ABNORMAL HIGH (ref 8–23)
CO2: 24 mmol/L (ref 22–32)
Calcium: 8.8 mg/dL — ABNORMAL LOW (ref 8.9–10.3)
Chloride: 100 mmol/L (ref 98–111)
Creatinine, Ser: 8.95 mg/dL — ABNORMAL HIGH (ref 0.61–1.24)
GFR, Estimated: 6 mL/min — ABNORMAL LOW (ref >60)
Glucose, Bld: 140 mg/dL — ABNORMAL HIGH (ref 70–99)
Phosphorus: 3.9 mg/dL (ref 2.5–4.6)
Potassium: 3.9 mmol/L (ref 3.5–5.1)
Sodium: 134 mmol/L — ABNORMAL LOW (ref 135–145)

## 2022-06-14 LAB — RPR: RPR Ser Ql: NONREACTIVE

## 2022-06-14 LAB — MAGNESIUM: Magnesium: 2.4 mg/dL (ref 1.7–2.4)

## 2022-06-14 MED ORDER — POLYETHYLENE GLYCOL 3350 17 G PO PACK: 17.0000 g | PACK | Freq: Once | ORAL | Status: DC

## 2022-06-14 MED ORDER — METHOCARBAMOL 500 MG PO TABS
500.0000 mg | ORAL_TABLET | Freq: Three times a day (TID) | ORAL | Status: DC
  Administered 2022-06-14 – 2022-06-19 (×15): 500 mg via ORAL
  Filled 2022-06-14 (×15): qty 1

## 2022-06-14 MED ORDER — GENTAMICIN SULFATE 0.1 % EX CREA
1.0000 | TOPICAL_CREAM | Freq: Every day | CUTANEOUS | Status: DC
  Administered 2022-06-15 (×2): 1 via TOPICAL
  Filled 2022-06-14: qty 15

## 2022-06-14 NOTE — Progress Notes (Addendum)
[facility] NEPHROLOGY PROGRESS NOTE  Assessment/ Plan: Pt is a 71 y.o. yo male   with past medical history significant for OSA, COPD, type II DM, CAD, ESRD on PD, anemia who was presented with chest pain, and NSTEMI, seen as a consultation for the management of ESRD.   Dialysis Orders from previous admission in 01/2022.: [facility] therapies patient.  CCPD, 5 exchanges 3 liter fill volumes, 10 min fill time, 1 hour 20 minutes dwell, 20 minute drain.  Last fill Icodextran.  EDW 103 kg. Uses combination of 1.5% and 2.5% depending upon BP and weight    # NSTEMI/unstable angina: Cardiac cath on 11/14 with severe in in-stent restenosis in the right coronary artery and proximal LAD disease.  Underwent CABG on 11/20.  Per CT Surgery   # ESRD on PD: CCPD at night, 5 exchanges, 2-2.5 L fill volume, mix of 1.5 and 2.5% solution.  He does have left upper extremity AV fistula but likely not fully functional. Patient was complaining of abdominal distention and difficulty with 2.5 L of fill volume on 11/17.  We lowered fill volume to 2 L and he tolerated well.  Held RRT on 11/20 but then started CRRT on 11/21.  Temporary dialysis catheter placed by CCM.  PD catheter flushed 11/26 am ------------------ - Will discuss his temporary catheter with pulm as note that catheter needs to be removed prior to pacemaker placement - PD not really yielding UF/fluid - Continue PD tonight - not getting great UF with PD.  Try 4.25 and 2.5% dextrose to see if improves UF - He is on miralax - give an additional dose today      # Hypotension: Pressors per primary - now off.  Note patient on midodrine    # Anemia of ESRD: Iron saturation 27 with high ferritin.  on aranesp 150 mcg every Friday.  Received transfusion with surgery per charting  #CKD-MBD:  Continue calcitriol.  Remain off of sevelamer for now as phos lower after CRRT.  Anticipate will need to resume  # heart block - per cardiology and CTS.  Per charting needs dialysis catheter removed prior to being able to place a pacemaker.     # Dysuria - note UA was ordered not yet obtained and pt doesn't make much urine; pt discomfort improved   Disposition - continue inpatient monitoring   -------------------------------  Subjective:  He had 648 mL UF over 11/27 PM with PD.  Still on this am but machine indicates 218 mL UF.  He is not able to tell me how much he gets off at home.  Says abdomen is normally distended.  His only complaint is his backside and he wants to go home and sleep in a comfortable bed and he emphasizes this.   Review of systems:    Denies shortness of breath or chest pain  Denies n/v He had a BM yesterday.   Objective Vital signs in last 24 hours: Vitals:   06/14/22 0300 06/14/22 0400 06/14/22 0500 06/14/22 0630  BP: 93/68 94/66 (!) 87/61   Pulse: 73 67 71   Resp: 16 14 (!) 30   Temp:  98.5 F (36.9 C)  98.5 F (36.9 C)  TempSrc:  Oral  Oral  SpO2: 91% 96% 94%   Weight:   105.6 kg   Height:       Weight change: -0.2 kg  Intake/Output Summary (Last 24 hours) at 06/14/2022 6734 Last data filed at 06/13/2022 2300 Gross per  24 hour  Intake 1498 ml  Output --  Net 1498 ml       Labs: RENAL PANEL Recent Labs    06/06/22 0303 06/06/22 0938 06/06/22 1651 06/07/22 0344 06/07/22 0612 06/08/22 0415 06/08/22 1510 06/09/22 0405 06/09/22 1602 06/10/22 0421 06/10/22 1600 06/11/22 0417 06/11/22 1642 06/12/22 0341 06/13/22 0323 06/14/22 0531  NA 135   < > 136  135 137   < > '139 138 140 139 136 136 136 139 138 136 '$ 134*  K 6.1*   < > 5.2*  5.2* 4.2   < > 4.5 4.3 4.1 4.1 4.1 4.0 4.0 4.3 4.0 3.8 3.9  CL 100  --  101  101 102   < > '103 102 102 103 101 104 102 104 98 100 '$ 100  CO2 21*  --  22  22 20*   < > '24 24 26 24 25 26 26 26 23 26 24  '$ GLUCOSE 132*  --  129*  130* 134*   < > 126* 129* 105* 109* 98 132* 117* 156* 148* 173* 140*  BUN 49*  --  47*  47* 30*   < > '19 18 15 15 12 11 10 12 17 21  '$ 26*  CREATININE 19.02*  --  16.63*  16.95* 11.40*   < > 6.08* 5.10* 4.30* 3.67* 3.09* 2.99* 2.98* 3.65* 5.23* 7.16* 8.95*  CALCIUM 8.0*  --  7.7*  7.6* 7.8*   < > 8.6* 8.5* 8.5* 8.0* 8.0* 8.0* 7.9* 8.4* 8.6* 8.6* 8.8*  MG 2.0  --  2.0 2.0  --  2.4  --  2.4  --  2.5*  --  2.4  --  2.5* 2.6* 2.4  PHOS  --   --  4.6 3.7   < > 2.8 2.5 2.2* 4.3 2.4* 2.7 2.4* 2.3* 2.5 3.8 3.9  ALBUMIN  --   --  2.7* 2.7*   < > 2.5* 2.4* 2.2* 2.3* 2.2* 2.2* 2.2* 2.4* 2.3* 2.5* 2.2*   < > = values in this interval not displayed.     Liver Function Tests: Recent Labs  Lab 06/12/22 0341 06/13/22 0323 06/14/22 0531  ALBUMIN 2.3* 2.5* 2.2*   No results for input(s): "LIPASE", "AMYLASE" in the last 168 hours. No results for input(s): "AMMONIA" in the last 168 hours. CBC: Recent Labs    06/02/22 0312 06/03/22 0648 06/09/22 1601 06/10/22 0421 06/11/22 0417 06/12/22 0342 06/13/22 0323  HGB 8.5*   < > 8.3* 8.0* 8.0* 7.7* 7.8*  MCV 95.4   < > 95.6 96.6 97.0 99.6 103.1*  FERRITIN 1,105*  --   --   --   --   --   --   TIBC 293  --   --   --   --   --   --   IRON 79  --   --   --   --   --   --    < > = values in this interval not displayed.    Cardiac Enzymes: No results for input(s): "CKTOTAL", "CKMB", "CKMBINDEX", "TROPONINI" in the last 168 hours. CBG: Recent Labs  Lab 06/13/22 1133 06/13/22 1456 06/13/22 1640 06/13/22 2158 06/14/22 0628  GLUCAP 132* 131* 120* 146* 134*    Iron Studies: No results for input(s): "IRON", "TIBC", "TRANSFERRIN", "FERRITIN" in the last 72 hours. Studies/Results: No results found.  Medications: Infusions:  sodium chloride Stopped (06/11/22 0954)   dialysis solution 1.5% low-MG/low-CA     norepinephrine (LEVOPHED) Adult  infusion Stopped (06/11/22 0954)    Scheduled Medications:  aspirin EC  325 mg Oral Daily   atorvastatin  80 mg Oral Daily   bisacodyl  10 mg Oral Daily   Or   bisacodyl  10 mg Rectal Daily   Chlorhexidine Gluconate Cloth  6 each Topical  Daily   darbepoetin (ARANESP) injection - DIALYSIS  150 mcg Subcutaneous Q Fri-1800   docusate sodium  200 mg Oral BID   feeding supplement (NEPRO CARB STEADY)  237 mL Oral Q1400   gentamicin cream  1 Application Topical Daily   heparin injection (subcutaneous)  5,000 Units Subcutaneous Q8H   insulin aspart  0-5 Units Subcutaneous QHS   insulin aspart  0-9 Units Subcutaneous TID WC   insulin detemir  8 Units Subcutaneous Daily   melatonin  3 mg Oral QHS   midodrine  15 mg Oral TID WC   oxybutynin  5 mg Oral TID   pantoprazole  40 mg Oral Daily   polyethylene glycol  17 g Oral Daily   QUEtiapine  25 mg Oral QHS   senna  2 tablet Oral Daily   sodium chloride flush  3 mL Intravenous Q12H    have reviewed scheduled and prn medications.  Physical Exam:    General adult male in bed in no acute distress HEENT normocephalic atraumatic extraocular movements intact sclera anicteric Neck supple trachea midline Lungs clear to auscultation bilaterally normal work of breathing at rest on room air Heart S1S2 Abdomen soft nontender distended and obese habitus Extremities trace to no lower extremity edema  Psych normal mood and affect Neuro - alert and oriented x 3 provides hx and follows commands Access PD catheter in place; AVF with bruit - seems small and no thrill; left IJ nontunneled HD catheter    Claudia Desanctis 06/14/2022,7:07 AM  LOS: 15 days    Spoke with pulm re: line issues above and they are coordinating with CT surgery.    Measures as above to optimize PD and I will also get upright KUB.    May ultimately need to transition to HD   Claudia Desanctis, MD 7:38 AM 06/14/2022

## 2022-06-14 NOTE — Progress Notes (Addendum)
[facility] DAILY ICU PROGRESS NOTE                   [address]            [place] [address]          [phone]   9 Days Post-Op Procedure(s) (LRB): OFF PUMP CORONARY ARTERY BYPASS GRAFTING (CABG) X 2 BYPASSES USING LEFT INTERNAL MAMMARY ARTERY AND RIGHT LEG GREATER SAPHENOUS VEIN HARVESTED ENDOSCOPICALLY (N/A) TRANSESOPHAGEAL ECHOCARDIOGRAM (TEE) (N/A)  Total Length of Stay:  LOS: 15 days   Subjective: Patient states he did not walk yesterday as he was "hurting". He has pain in left buttock this am  Objective: Vital signs in last 24 hours: Temp:  [98.5 F (36.9 C)-99.1 F (37.3 C)] 98.5 F (36.9 C) (11/29 0630) Pulse Rate:  [60-90] 71 (11/29 0500) Cardiac Rhythm: Heart block (11/28 2000) Resp:  [14-31] 30 (11/29 0500) BP: (82-123)/(49-73) 87/61 (11/29 0500) SpO2:  [89 %-100 %] 94 % (11/29 0500) FiO2 (%):  [40 %] 40 % (11/28 1600) Weight:  [105.6 kg] 105.6 kg (11/29 0500)  Filed Weights   06/12/22 0354 06/13/22 0500 06/14/22 0500  Weight: 104.1 kg 105.8 kg 105.6 kg    Weight change: -0.2 kg   Hemodynamic parameters for last 24 hours: CVP:  [4 mmHg-16 mmHg] 10 mmHg  Intake/Output from previous day: 11/28 0701 - 11/29 0700 In: 9450 [P.O.:840; I.V.:10] Out: -   Intake/Output this shift: Total I/O In: 130 [P.O.:120; I.V.:10] Out: -   Current Meds: Scheduled Meds:  aspirin EC  325 mg Oral Daily   atorvastatin  80 mg Oral Daily   bisacodyl  10 mg Oral Daily   Or   bisacodyl  10 mg Rectal Daily   Chlorhexidine Gluconate Cloth  6 each Topical Daily   darbepoetin (ARANESP) injection - DIALYSIS  150 mcg Subcutaneous Q Fri-1800   docusate sodium  200 mg Oral BID   feeding supplement (NEPRO CARB STEADY)  237 mL Oral Q1400   gentamicin cream  1 Application Topical Daily   heparin injection (subcutaneous)  5,000 Units Subcutaneous Q8H   insulin aspart  0-5 Units Subcutaneous QHS   insulin aspart  0-9 Units Subcutaneous TID WC   insulin detemir  8  Units Subcutaneous Daily   melatonin  3 mg Oral QHS   midodrine  15 mg Oral TID WC   oxybutynin  5 mg Oral TID   pantoprazole  40 mg Oral Daily   polyethylene glycol  17 g Oral Daily   QUEtiapine  25 mg Oral QHS   senna  2 tablet Oral Daily   sodium chloride flush  3 mL Intravenous Q12H   Continuous Infusions:  sodium chloride Stopped (06/11/22 0954)   dialysis solution 1.5% low-MG/low-CA     norepinephrine (LEVOPHED) Adult infusion Stopped (06/11/22 0954)   PRN Meds:.sodium chloride, albuterol, morphine injection, ondansetron (ZOFRAN) IV, mouth rinse, oxyCODONE, traMADol  General appearance: alert, cooperative, and no distress Neurologic: intact Heart: RRR Lungs: Slightly diminished bibasilar breath sounds Abdomen: Soft, non tender, mildly obese, bowel sounds present Extremities: SCDs in place Wounds: Clean and dry  Lab Results: CBC: Recent Labs    06/12/22 0342 06/13/22 0323  WBC 8.3 7.4  HGB 7.7* 7.8*  HCT 25.0* 26.8*  PLT 167 178   BMET:  Recent Labs    06/13/22 0323 06/14/22 0531  NA 136 134*  K 3.8 3.9  CL 100 100  CO2 26 24  GLUCOSE 173* 140*  BUN 21 26*  CREATININE 7.16* 8.95*  CALCIUM 8.6* 8.8*    CMET: Lab Results  Component Value Date   WBC 7.4 06/13/2022   HGB 7.8 (L) 06/13/2022   HCT 26.8 (L) 06/13/2022   PLT 178 06/13/2022   GLUCOSE 140 (H) 06/14/2022   CHOL 88 06/03/2022   TRIG 98 06/03/2022   HDL 32 (L) 06/03/2022   LDLCALC 36 06/03/2022   ALT 14 06/03/2022   AST 20 06/03/2022   NA 134 (L) 06/14/2022   K 3.9 06/14/2022   CL 100 06/14/2022   CREATININE 8.95 (H) 06/14/2022   BUN 26 (H) 06/14/2022   CO2 24 06/14/2022   TSH 2.033 05/30/2022   INR 1.2 06/05/2022   HGBA1C 5.9 (H) 05/30/2022   MICROALBUR 7.4 02/09/2020      PT/INR: No results for input(s): "LABPROT", "INR" in the last 72 hours. Radiology: No results found.   Assessment/Plan: S/P Procedure(s) (LRB): OFF PUMP CORONARY ARTERY BYPASS GRAFTING (CABG) X 2 BYPASSES  USING LEFT INTERNAL MAMMARY ARTERY AND RIGHT LEG GREATER SAPHENOUS VEIN HARVESTED ENDOSCOPICALLY (N/A) TRANSESOPHAGEAL ECHOCARDIOGRAM (TEE) (N/A) CV-Previously, intermittently paced and second degree HB. Per EP evaluation yesterday, improving conduction and no current urgent need for pacing. SR with HR in the 80's this am. On Midodrine 15 mg tid Pulmonary-on room air. History of OSA-refused CPAP last night. Encourage incentive spirometer ESRD-Creatinine this am 8.95. Nephrology arranging for dialysis (had yesterday). Blood loss anemia/anemia of chronic disease-H and H 11/28 7.8 and 26.8. On Aranesp every Friday 5. DM-CBGs 120/146/134. On Insulin. Pre op HGA1C 5.9. Will restart Tradjenta later in hospital course 6. Regarding left buttock pain-no evidence of wound. ? Sciatica. Unable to give NSAIDS secondary to ESRD 7. Will discuss with Dr.Osmin Welz if should remain in ICU (heart block, intermittently paced) or may be transferred to [place] PA-C 06/14/2022 6:55 AM  Agree with above Pacer set to 35.  Will continue to watch.  Hopefully will remove temp wires tomorrow On peritoneal dialysis, concern for future hemodialysis Will keep in the unit while assessing rhythm  Jalonda Antigua O Shelden Raborn

## 2022-06-14 NOTE — Progress Notes (Signed)
NAME:  [name] [name], MRN:  [id number], DOB:  07/01/1951, LOS: 83 ADMISSION DATE:  05/30/2022, CONSULTATION DATE: 06/05/2022 REFERRING MD: Dr. Kipp Brood, CHIEF COMPLAINT: S/p CABG  History of Present Illness:  71 year old male with end-stage renal disease on peritoneal dialysis, diabetes type 2, hypertension, hyperlipidemia, coronary artery disease, OSA and COPD, who was admitted with unstable angina, on work-up noted to have multivessel coronary artery disease underwent CABG x2, postop requiring vasopressor support and remain on full mechanical ventilatory support. Received 2 units of PRBCs in OR PCCM was consulted for help evaluation medical management  Pertinent  Medical History   Past Medical History:  Diagnosis Date   Anemia    Arthritis    Asthma    Bell palsy    CAD (coronary artery disease)    a. 2014 MV: abnl w/ infap ischemia; b. 03/2013 Cath: aneurysmal bleb in the LAD w/ otw nonobs dzs-->Med Rx.   Chronic back pain    Chronic knee pain    a. 09/2015 s/p R TKA.   Chronic pain    Chronic shoulder pain    Chronic sinusitis    COPD (chronic obstructive pulmonary disease) ([place])    Diabetes mellitus without complication ([place])    type II    ESRD on peritoneal dialysis ([place])    on peritoneal dialysis, [facility]   Essential hypertension    GERD (gastroesophageal reflux disease)    Gout    Gout    Hepatomegaly    noted on noncontrast CT 2015   History of hiatal hernia    Hyperlipidemia    Lateral meniscus tear    Obesity    Truncal   Obstructive sleep apnea    does not use cpap    On home oxygen therapy    uses 2l when is going somewhere per patient    PUD (peptic ulcer disease)    remote, reports f/u EGD about 8 years ago unremarkable    Reactive airway disease    related to exposure to chemical during 9/11   Sinusitis    Vitamin D deficiency      Significant Hospital Events: Including procedures, antibiotic start and stop dates in addition to other  pertinent events   11/14 admitted with NSTEMI 11/20 CABG 11/29 pacer turned down to backup rate 35  Interim History / Subjective:  No new complaints today.  Objective   Blood pressure (!) 124/113, pulse 71, temperature 98.5 F (36.9 C), temperature source Oral, resp. rate 18, height '5\' 4"'$  (1.626 m), weight 105.6 kg, SpO2 94 %. CVP:  [4 mmHg-16 mmHg] 10 mmHg  FiO2 (%):  [40 %] 40 %   Intake/Output Summary (Last 24 hours) at 06/14/2022 0743 Last data filed at 06/13/2022 2300 Gross per 24 hour  Intake 850 ml  Output --  Net 850 ml    Filed Weights   06/12/22 0354 06/13/22 0500 06/14/22 0500  Weight: 104.1 kg 105.8 kg 105.6 kg    Examination: General: Chronically ill-appearing man lying in bed no acute distress Neuro: Awake and alert, watching TV, answering questions appropriately.  Moving all extremities. HEENT: [place]/AT, eyes anicteric Neck: RIJ CVC removed.  L IJ dialysis catheter. Cardiovascular: S1-S2, bradycardic but regular rhythm when sleeping, rate increases to around 70 when awake.  Not pacing during my exam. Lungs: Breathing comfortably on room air, CTAB Abdomen: Obese, soft, nontender.  PD catheter in place Musculoskeletal: No edema to the knees and bilateral lower extremities Skin: Warm, dry no diffuse rashes  BUN 26 Cr 8.95  Resolved Hospital Problem list   Cardiogenic shock, resolved Acute hypoxic respiratory failure due to pulmonary edema, currently on nasal cannula oxygen Acute delirium, improved Hyperkalemia, resolved  Assessment & Plan:   Acute NSTEMI - s/p 2-vessel CABG 11/20 -Postoperative care per T CTS - Continue aspirin, statin. - Metoprolol on hold due to variable heart block. - Continue telemetry monitoring - CBC removed today.   Mobitz type II heart block Hx HTN, HLD -Continue pacing wires per T CTS - Backup pacer decreased to rate of 30.  Had more significant bradycardia this afternoon with heart block.  Prescient EP's management  Hx  OSA/OHS - on nocturnal CPAP. -Nocturnal CPAP.  ESRD on PD - Appreciate nephrology's management.  Unsure if he long-term be able to maintain on peritoneal dialysis.  So far he has been having issues with volume removal-evaluation Diabetes type 2, hyperglycemia - Continue Levemir 8 units daily - Sliding scale insulin as needed - Goal blood glucose less than 180, currently controlled.  Acute blood loss anemia, anemia of end-stage renal disease -Transfuse for hemoglobin less than 7 or hemodynamically significant bleeding.  Deconditioning -PT, OT  Penis pain - unclear etiology. No clear wounds/lesions/rash, no discharge. -GC and Chlamydia probe -Unable to collect urine for UA  Best Practice (right click and "Reselect all SmartList Selections" daily)  Per primary team.    Julian Hy, DO 06/14/22 5:36 PM  Pulmonary & Critical Care  For contact information, see Amion. If no response to pager, please call PCCM consult pager. After hours, 7PM- 7AM, please call [place].

## 2022-06-14 NOTE — Progress Notes (Addendum)
Electrophysiology Rounding Note  Patient Name: [name] [name] Date of Encounter: 06/14/2022  Primary Cardiologist: Pixie Casino, MD Electrophysiologist: New   Subjective   NAEO. Frustrated and uncomfortable from sitting.   Inpatient Medications    Scheduled Meds:  aspirin EC  325 mg Oral Daily   atorvastatin  80 mg Oral Daily   bisacodyl  10 mg Oral Daily   Or   bisacodyl  10 mg Rectal Daily   Chlorhexidine Gluconate Cloth  6 each Topical Daily   darbepoetin (ARANESP) injection - DIALYSIS  150 mcg Subcutaneous Q Fri-1800   docusate sodium  200 mg Oral BID   feeding supplement (NEPRO CARB STEADY)  237 mL Oral Q1400   gentamicin cream  1 Application Topical Daily   gentamicin cream  1 Application Topical Daily   heparin injection (subcutaneous)  5,000 Units Subcutaneous Q8H   insulin aspart  0-5 Units Subcutaneous QHS   insulin aspart  0-9 Units Subcutaneous TID WC   insulin detemir  8 Units Subcutaneous Daily   melatonin  3 mg Oral QHS   midodrine  15 mg Oral TID WC   oxybutynin  5 mg Oral TID   pantoprazole  40 mg Oral Daily   polyethylene glycol  17 g Oral Daily   polyethylene glycol  17 g Oral Once   QUEtiapine  25 mg Oral QHS   senna  2 tablet Oral Daily   sodium chloride flush  3 mL Intravenous Q12H   Continuous Infusions:  sodium chloride Stopped (06/11/22 0954)   dialysis solution 1.5% low-MG/low-CA     norepinephrine (LEVOPHED) Adult infusion Stopped (06/11/22 0954)   PRN Meds: sodium chloride, albuterol, morphine injection, ondansetron (ZOFRAN) IV, mouth rinse, oxyCODONE, traMADol   Vital Signs    Vitals:   06/14/22 0600 06/14/22 0630 06/14/22 0700 06/14/22 0730  BP:   (!) 124/113   Pulse:   71   Resp: (!) 21  18   Temp:  98.5 F (36.9 C)  98.5 F (36.9 C)  TempSrc:  Oral  Oral  SpO2:   94%   Weight:      Height:        Intake/Output Summary (Last 24 hours) at 06/14/2022 0837 Last data filed at 06/13/2022 2300 Gross per 24 hour   Intake 610 ml  Output --  Net 610 ml   Filed Weights   06/12/22 0354 06/13/22 0500 06/14/22 0500  Weight: 104.1 kg 105.8 kg 105.6 kg    Physical Exam    GEN- The patient is well appearing, alert and oriented x 3 today.   Lungs- Slightly diminished.  Heart- Regular rate and rhythm, no murmurs, rubs or gallops GI- soft, NT, ND, + BS Extremities- SCDs in place.  Skin- no rash or lesion  Labs    CBC Recent Labs    06/12/22 0342 06/13/22 0323  WBC 8.3 7.4  HGB 7.7* 7.8*  HCT 25.0* 26.8*  MCV 99.6 103.1*  PLT 167 818   Basic Metabolic Panel Recent Labs    06/13/22 0323 06/14/22 0531  NA 136 134*  K 3.8 3.9  CL 100 100  CO2 26 24  GLUCOSE 173* 140*  BUN 21 26*  CREATININE 7.16* 8.95*  CALCIUM 8.6* 8.8*  MG 2.6* 2.4  PHOS 3.8 3.9   Liver Function Tests Recent Labs    06/13/22 0323 06/14/22 0531  ALBUMIN 2.5* 2.2*   No results for input(s): "LIPASE", "AMYLASE" in the last 72 hours. Cardiac Enzymes No  results for input(s): "CKTOTAL", "CKMB", "CKMBINDEX", "TROPONINI" in the last 72 hours.   Telemetry    NSR 70-80s, with grouped beating/Mobitz 1 at times. Has had 2:1 rarely, occasional V pacing after a dropped beat when set to VVI 50.  (personally reviewed)  Radiology    No results found.  Patient Profile     [name] [name] is a 71 y.o. male with a hx of IDDM, HTN, HLD, CAD (prior DES to RCA Jan 2023 > June 2023 >> July 2023)ESRF on PD, OSA,  who is being seen 06/07/2022 for the evaluation of persistent heart block at the request of Dr. Kipp [name].   Assessment & Plan    Advanced AV block Mobitz one with 2:1 and known nocturnal CHB at his baseline and has been relatively asymptomatic.  He has multiple indwelling catheters and there is some concern he [name] [name] need to transition back to HD.  His risk of infection is significantly elevated.  Pacer turned down to 35 VVI.  We hope to avoid permanent pacing, but if ends up needed, would likely require Leadless  to minimize risk of infection.  For questions or updates, please contact [name] [name] Please consult [email or web address] for contact info under Cardiology/STEMI.  Signed, [name] Friar, PA-C  06/14/2022, 8:37 AM   I have seen and examined this patient with [name] [name].  Agree with above, note added to reflect my findings.  Patient feeling poorly.  Frustrated and uncomfortable sitting in a chair.  Otherwise no complaints.  GEN: Well nourished, well developed, in no acute distress  HEENT: normal  Neck: no JVD, carotid bruits, or masses Cardiac: Irregular; no murmurs, rubs, or gallops,no edema  Respiratory:  clear to auscultation bilaterally, normal work of breathing GI: soft, nontender, nondistended, + BS MS: no deformity or atrophy  Skin: warm and dry Neuro:  Strength and sensation are intact Psych: euthymic mood, full affect   Secondary AV block: Patient has evidence of Mobitz 1 AV block currently.  He is having intermittent ventricular pacing.  I have turned his pacing rate down to VVI 35.  If he does not had any further pacing, pacing wires can be removed.  At this point, we [name] [name] hold off on pacemaker implant.  There is concerned that he may need to switch from peritoneal dialysis to hemodialysis.  If this occurs, would be at very high risk of pacemaker infection if device implant is needed.  [name] [name] M. [name] Kilner MD 06/14/2022 9:24 AM

## 2022-06-15 ENCOUNTER — Encounter (HOSPITAL_COMMUNITY): Payer: Self-pay | Admitting: Certified Registered Nurse Anesthetist

## 2022-06-15 ENCOUNTER — Inpatient Hospital Stay (HOSPITAL_COMMUNITY): Payer: Medicare Other

## 2022-06-15 DIAGNOSIS — I152 Hypertension secondary to endocrine disorders: Secondary | ICD-10-CM

## 2022-06-15 DIAGNOSIS — I441 Atrioventricular block, second degree: Secondary | ICD-10-CM | POA: Diagnosis not present

## 2022-06-15 DIAGNOSIS — R739 Hyperglycemia, unspecified: Secondary | ICD-10-CM | POA: Diagnosis not present

## 2022-06-15 DIAGNOSIS — E1159 Type 2 diabetes mellitus with other circulatory complications: Secondary | ICD-10-CM

## 2022-06-15 DIAGNOSIS — G4733 Obstructive sleep apnea (adult) (pediatric): Secondary | ICD-10-CM

## 2022-06-15 DIAGNOSIS — N186 End stage renal disease: Secondary | ICD-10-CM | POA: Diagnosis not present

## 2022-06-15 LAB — CBC
HCT: 26 % — ABNORMAL LOW (ref 39.0–52.0)
Hemoglobin: 7.8 g/dL — ABNORMAL LOW (ref 13.0–17.0)
MCH: 30.5 pg (ref 26.0–34.0)
MCHC: 30 g/dL (ref 30.0–36.0)
MCV: 101.6 fL — ABNORMAL HIGH (ref 80.0–100.0)
Platelets: 211 10*3/uL (ref 150–400)
RBC: 2.56 MIL/uL — ABNORMAL LOW (ref 4.22–5.81)
RDW: 17.9 % — ABNORMAL HIGH (ref 11.5–15.5)
WBC: 6.4 10*3/uL (ref 4.0–10.5)
nRBC: 3.7 % — ABNORMAL HIGH (ref 0.0–0.2)

## 2022-06-15 LAB — RENAL FUNCTION PANEL
Albumin: 2.3 g/dL — ABNORMAL LOW (ref 3.5–5.0)
Anion gap: 11 (ref 5–15)
BUN: 29 mg/dL — ABNORMAL HIGH (ref 8–23)
CO2: 26 mmol/L (ref 22–32)
Calcium: 9.1 mg/dL (ref 8.9–10.3)
Chloride: 100 mmol/L (ref 98–111)
Creatinine, Ser: 10.65 mg/dL — ABNORMAL HIGH (ref 0.61–1.24)
GFR, Estimated: 5 mL/min — ABNORMAL LOW (ref >60)
Glucose, Bld: 153 mg/dL — ABNORMAL HIGH (ref 70–99)
Phosphorus: 4.6 mg/dL (ref 2.5–4.6)
Potassium: 3.9 mmol/L (ref 3.5–5.1)
Sodium: 137 mmol/L (ref 135–145)

## 2022-06-15 LAB — LACTIC ACID, PLASMA
Lactic Acid, Venous: 1.4 mmol/L (ref 0.5–1.9)
Lactic Acid, Venous: 1.4 mmol/L (ref 0.5–1.9)

## 2022-06-15 LAB — MAGNESIUM: Magnesium: 2.5 mg/dL — ABNORMAL HIGH (ref 1.7–2.4)

## 2022-06-15 LAB — GLUCOSE, CAPILLARY
Glucose-Capillary: 121 mg/dL — ABNORMAL HIGH (ref 70–99)
Glucose-Capillary: 123 mg/dL — ABNORMAL HIGH (ref 70–99)
Glucose-Capillary: 123 mg/dL — ABNORMAL HIGH (ref 70–99)

## 2022-06-15 LAB — CORTISOL: Cortisol, Plasma: 18.4 ug/dL

## 2022-06-15 MED ORDER — DELFLEX-LC/2.5% DEXTROSE 394 MOSM/L IP SOLN: INTRAPERITONEAL | Status: DC

## 2022-06-15 MED ORDER — SODIUM CHLORIDE 0.9 % IV BOLUS
250.0000 mL | Freq: Once | INTRAVENOUS | Status: AC
  Administered 2022-06-15: 250 mL via INTRAVENOUS

## 2022-06-15 MED ORDER — GENTAMICIN SULFATE 0.1 % EX CREA: 1.0000 | TOPICAL_CREAM | Freq: Every day | CUTANEOUS | Status: DC

## 2022-06-15 MED ORDER — SODIUM CHLORIDE 0.9 % IV SOLN: INTRAVENOUS | Status: DC | PRN

## 2022-06-15 MED ORDER — GENTAMICIN SULFATE 0.1 % EX CREA
1.0000 | TOPICAL_CREAM | Freq: Every day | CUTANEOUS | Status: DC
  Administered 2022-06-16 – 2022-06-19 (×4): 1 via TOPICAL
  Filled 2022-06-15: qty 15

## 2022-06-15 MED ORDER — NEPRO/CARBSTEADY PO LIQD
237.0000 mL | Freq: Two times a day (BID) | ORAL | Status: DC
  Administered 2022-06-15 – 2022-06-19 (×6): 237 mL via ORAL

## 2022-06-15 MED ORDER — HEPARIN SODIUM (PORCINE) 1000 UNIT/ML IJ SOLN: INTRAPERITONEAL | Status: DC | PRN

## 2022-06-15 MED ORDER — POLYETHYLENE GLYCOL 3350 17 G PO PACK: 17.0000 g | PACK | Freq: Once | ORAL | Status: DC

## 2022-06-15 NOTE — Progress Notes (Signed)
Pt currently sleeping, Sats in 90s, will ask if he wants CPAP if he wakes up overnight.

## 2022-06-15 NOTE — Progress Notes (Signed)
Initial Nutrition Assessment  DOCUMENTATION CODES:   Obesity unspecified  INTERVENTION:   Liberalize diet to Regular with Fluid Restriction for now until po intake. Electrolytes acceptable, CBGs wdl.   Nepro Shake po BID, each supplement provides 425 kcal and 19 grams protein. Wife brining in Novasource Renal in [place] as we only have Nepro in [place] currently and pt only likes [place] and Chocolate. Ok to substitute with Ensure as needed  Renal MVI daily   NUTRITION DIAGNOSIS:   Inadequate oral intake related to poor appetite, acute illness as evidenced by per patient/family report.  GOAL:   Patient will meet greater than or equal to 90% of their needs  MONITOR:   PO intake, Supplement acceptance, Labs, Weight trends  REASON FOR ASSESSMENT:   Ventilator    ASSESSMENT:   71 yo male admitted with acute NSTEMI requiring 2V CABG. PMH includes ESRD on PD, CAD, COPD, DM, HTN,  11/14 Admit 11/20 CABG  Remains on PD, noted possible plan for iHD with possible need to resume CRRT Note UF 2.7 L overnight with PD with no immediate plans for iHD/CRRT  Outpatient PD prescription mix of 1.5 and 2.5% dextrose, 2-2.5 L fill volume and 5 exchanges. Pt receiving some calories via PD dialysate  Appetite is very poor; pt did not eat breakfast this AM and has not touched lunch tray on visit today (looks like it has been sitting there a bit has orange sherbet is melted). Pt reports he might eat something in a little while.   Pt denies any GI issues (no N/V/D/C), just no appetite  EDW 103 kg; current 105.6 kg  Labs: potassium wdl, sodium wdl, phosphorus wdl, Creatinine 10.65 (H), CBGs <200 Meds: ss novolog, miralax, colace    Diet Order:   Diet Order             Diet NPO time specified  Diet effective midnight           Diet regular Room service appropriate? Yes; Fluid consistency: Thin; Fluid restriction: 1200 mL Fluid  Diet effective now                    EDUCATION NEEDS:   Education needs have been addressed  Skin:  Skin Assessment: Reviewed RN Assessment  Last BM:  11/19  Height:   Ht Readings from Last 1 Encounters:  05/30/22 '5\' 4"'$  (1.626 m)    Weight:   Wt Readings from Last 1 Encounters:  06/14/22 105.6 kg    BMI:  Body mass index is 39.96 kg/m.  Estimated Nutritional Needs:   Kcal:  1800-2000 kcals  Protein:  90-110 g  Fluid:  1.5 L   Kerman Passey MS, RDN, LDN, CNSC Registered Dietitian 3 Clinical Nutrition RD Pager and On-Call Pager Number Located in [place]

## 2022-06-15 NOTE — Progress Notes (Signed)
   06/15/22 1820  Peritoneal Catheter Left lower abdomen  Placement Date/Time: 10/07/18 4196   Time out: Correct Patient;Correct Site;Correct Procedure;Special equipment/requirements available  Catheter Location: Left lower abdomen  Serial / Lot #: [id number]  Expiration Date: 11/14/22  Site Assessment Clean, Dry, Intact  Drainage Description None  Catheter status Capped;Clamped  Dressing Gauze/Drain sponge  Dressing Status Clean, Dry, Intact  Dressing Intervention Dressing changed  Cycler Setup  Total Number of Night Cycles 5  Night Fill Volume 2000  Dianeal Solution Dextrose 2.5% in 6000 mL Low Cal/Low Mag  Night Dwell Time per Cycle - Hour(s) 1  Night Dwell Time per Cycle - Minute(s) 30  Night Time Therapy - Minute(s) 56  Night Time Therapy - Hour(s) 9  Minimum Initial Drain Volume 0  Maximum Peritoneal Volume 3000  Night/Total Therapy Volume 10000  Day Exchange No  Hand-Off documentation  Handoff Given Given to shift RN/LPN  Report given to (Full Name) Clyde Lundborg, RN   Comment section of PD orders clearly state, "all 2.5% dextrose. This is a change in his prior orders for tonight - please use these updated orders." However, when I went to document the admin of the PD fluid on the MAR, the MAR only had listed 1.5% so I did not document against it. It would not let me modify the order. Will contact Dr. Candiss Norse (on call) to see if he can change the order so I can document against the proper PD fluids

## 2022-06-15 NOTE — Progress Notes (Signed)
Pt does not wish to wear CPAP at this time, said he'd be willing around 10pm. Will check back with pt closer to that time regarding the CPAP.

## 2022-06-15 NOTE — Progress Notes (Addendum)
[name] PROGRESS NOTE  Assessment/ Plan: Pt is a 71 y.o. yo male   with past medical history significant for [name] [name], [name] [name] [name], [name] [name] [name], [name] [name], [name] as a consultation for the management of [name].   Dialysis Orders from previous admission in 01/2022.: [facility] therapies patient.  CCPD, 5 exchanges 3 liter fill volumes, 10 min fill time, 1 hour 20 minutes dwell, 20 minute drain.  Last fill Icodextran.  EDW 103 kg. Uses combination of 1.5% [name] 2.5% depending upon BP [name] weight    # [name]/unstable angina: Cardiac cath on 11/14 with severe in in-stent restenosis in the right coronary artery [name] proximal LAD disease.  Underwent CABG on 11/20.  Per CT Surgery   # [name] [name]: CCPD at night, 5 exchanges, 2-2.5 L fill volume, mix of 1.5 [name] 2.5% solution.  He does have left upper extremity AV fistula but likely not fully functional. Patient was complaining of abdominal distention [name] difficulty with 2.5 L of fill volume on 11/17.  We lowered fill volume to 2 L [name] he tolerated well.  Held RRT on 11/20 but then started CRRT on 11/21.  Temporary dialysis catheter placed by CCM.  [name] catheter flushed 11/26 am ------------------ - I am concerned that he will need to do HD - await today's labs [name] overnight UF  - Continue [name] tonight - not getting great UF with [name].  Try 4.25 [name] 2.5% dextrose to see if improves UF.  I will follow-up his UF today - he's still on cycler  - He is on miralax - give an additional dose today (he didn't get the extra dose yesterday)    # Hypotension: Pressors per primary - now off.  Note patient on midodrine    # [name] of [name]: Iron saturation 27 with high ferritin.  on aranesp 150 mcg every Friday.  Received transfusion with surgery per charting  #CKD-MBD:  Continue calcitriol.  Remain off of sevelamer for now as phos lower after CRRT.  Anticipate will need to resume  # heart block  - per cardiology [name] CTS. Per charting needs dialysis catheter removed prior to being able to place a pacemaker.  I have discussed with pulm.  From a renal standpoint, would proceed with pacemaker.   Disposition - continue inpatient monitoring   -------------------------------  Subjective:  Spoke with nursing.  He took the initiate to call baxter rep on his own to try [name] troubleshoot because he thought his [name] machine wasn't working.   I looked back at [name] UF before he was taken off of [name] [name] transitioned to CRRT.  He is to possibly get a pacer per the RN.  No UOP over 11/29.  Mostly 40'G systolic per RN [name] on chart review.  Outlier 89/61.  His labs were just drawn.  He isn't excited about the idea of HD.    UF trends  11/28 pm - 218 ml 11/27 pm - 648 ml 11/19 pm - 894 ml 11/17 pm - 1352 mL 11/16 pm - 100 mL  Review of systems:    Denies shortness of breath or chest [name]  Denies n/v Having BM's   Objective Vital signs in last 24 hours: Vitals:   06/15/22 0100 06/15/22 0203 06/15/22 0500 06/15/22 0600  BP: 101/63 103/85 (!) 83/56 (!) 89/61  Pulse: 74     Resp: (!) 33 (!) '29 20 16  '$ Temp:  TempSrc:      SpO2: 93%     Weight:      Height:       Weight change:   Intake/Output Summary (Last 24 hours) at 06/15/2022 0625 Last data filed at 06/15/2022 0200 Gross per 24 hour  Intake 794 ml  Output 218 ml  Net 576 ml       Labs: RENAL PANEL Recent Labs    06/06/22 0303 06/06/22 3536 06/06/22 1651 06/07/22 0344 06/07/22 0612 06/08/22 0415 06/08/22 1510 06/09/22 0405 06/09/22 1602 06/10/22 0421 06/10/22 1600 06/11/22 0417 06/11/22 1642 06/12/22 0341 06/13/22 0323 06/14/22 0531  NA 135   < > 136  135 137   < > '139 138 140 139 136 136 136 139 138 136 '$ 134*  K 6.1*   < > 5.2*  5.2* 4.2   < > 4.5 4.3 4.1 4.1 4.1 4.0 4.0 4.3 4.0 3.8 3.9  CL 100  --  101  101 102   < > '103 102 102 103 101 104 102 104 98 100 '$ 100  CO2 21*  --  22  22 20*   < > '24 24 26 24 25 26  26 26 23 26 24  '$ GLUCOSE 132*  --  129*  130* 134*   < > 126* 129* 105* 109* 98 132* 117* 156* 148* 173* 140*  BUN 49*  --  47*  47* 30*   < > '19 18 15 15 12 11 10 12 17 21 '$ 26*  CREATININE 19.02*  --  16.63*  16.95* 11.40*   < > 6.08* 5.10* 4.30* 3.67* 3.09* 2.99* 2.98* 3.65* 5.23* 7.16* 8.95*  CALCIUM 8.0*  --  7.7*  7.6* 7.8*   < > 8.6* 8.5* 8.5* 8.0* 8.0* 8.0* 7.9* 8.4* 8.6* 8.6* 8.8*  MG 2.0  --  2.0 2.0  --  2.4  --  2.4  --  2.5*  --  2.4  --  2.5* 2.6* 2.4  PHOS  --   --  4.6 3.7   < > 2.8 2.5 2.2* 4.3 2.4* 2.7 2.4* 2.3* 2.5 3.8 3.9  ALBUMIN  --   --  2.7* 2.7*   < > 2.5* 2.4* 2.2* 2.3* 2.2* 2.2* 2.2* 2.4* 2.3* 2.5* 2.2*   < > = values in this interval not displayed.     Liver Function Tests: Recent Labs  Lab 06/12/22 0341 06/13/22 0323 06/14/22 0531  ALBUMIN 2.3* 2.5* 2.2*   No results for input(s): "LIPASE", "AMYLASE" in the last 168 hours. No results for input(s): "AMMONIA" in the last 168 hours. CBC: Recent Labs    06/02/22 0312 06/03/22 0648 06/09/22 1601 06/10/22 0421 06/11/22 0417 06/12/22 0342 06/13/22 0323  HGB 8.5*   < > 8.3* 8.0* 8.0* 7.7* 7.8*  MCV 95.4   < > 95.6 96.6 97.0 99.6 103.1*  FERRITIN 1,105*  --   --   --   --   --   --   TIBC 293  --   --   --   --   --   --   IRON 79  --   --   --   --   --   --    < > = values in this interval not displayed.    Cardiac Enzymes: No results for input(s): "CKTOTAL", "CKMB", "CKMBINDEX", "TROPONINI" in the last 168 hours. CBG: Recent Labs  Lab 06/14/22 0628 06/14/22 0853 06/14/22 1130 06/14/22 1620 06/14/22 2137  GLUCAP 134* 121* 115*  128* 171*    Iron Studies: No results for input(s): "IRON", "TIBC", "TRANSFERRIN", "FERRITIN" in the last 72 hours. Studies/Results: DG Abd Portable 1V  Result Date: 06/14/2022 CLINICAL DATA:  End-stage chronic [name] disease, peritoneal dialysis, chest [name], burning with urination. EXAM: PORTABLE ABDOMEN - 1 VIEW COMPARISON:  CT abdomen pelvis 09/24/2019. FINDINGS:  Bowel gas pattern is unremarkable. No radiopaque calculi. A 4.2 cm vertically oriented tubular radiodense structure is [name] to the left of L4 [name] L5, representing the metallic portion of a dialysis catheter. Small bilateral pleural effusions [name] bibasilar atelectasis. IMPRESSION: 1. No acute findings in the abdomen. 2. Small bilateral pleural effusions [name] bibasilar atelectasis. Electronically Signed   By: Lorin Picket M.D.   On: 06/14/2022 08:51    Medications: Infusions:  sodium chloride Stopped (06/11/22 0954)   dialysis solution 1.5% low-MG/low-CA      Scheduled Medications:  aspirin EC  325 mg Oral Daily   atorvastatin  80 mg Oral Daily   bisacodyl  10 mg Oral Daily   Or   bisacodyl  10 mg Rectal Daily   Chlorhexidine Gluconate Cloth  6 each Topical Daily   darbepoetin (ARANESP) injection - DIALYSIS  150 mcg Subcutaneous Q Fri-1800   docusate sodium  200 mg Oral BID   feeding supplement (NEPRO CARB STEADY)  237 mL Oral Q1400   gentamicin cream  1 Application Topical Daily   gentamicin cream  1 Application Topical Daily   heparin injection (subcutaneous)  5,000 Units Subcutaneous Q8H   insulin aspart  0-5 Units Subcutaneous QHS   insulin aspart  0-9 Units Subcutaneous TID WC   insulin detemir  8 Units Subcutaneous Daily   melatonin  3 mg Oral QHS   methocarbamol  500 mg Oral TID   midodrine  15 mg Oral TID WC   oxybutynin  5 mg Oral TID   pantoprazole  40 mg Oral Daily   polyethylene glycol  17 g Oral Daily   polyethylene glycol  17 g Oral Once   QUEtiapine  25 mg Oral QHS   senna  2 tablet Oral Daily   sodium chloride flush  3 mL Intravenous Q12H    have reviewed scheduled [name] prn medications.  Physical Exam:     General adult male in bed in no acute distress HEENT normocephalic atraumatic extraocular movements intact sclera anicteric Neck supple trachea midline Lungs clear to auscultation bilaterally normal work of breathing at rest on room air Heart S1S2 Abdomen  soft nontender distended [name] obese habitus Extremities trace to 1+ lower extremity edema with legs elevated  Psych normal mood [name] affect Neuro - alert [name] oriented x 3 provides hx [name] follows commands Access [name] catheter in place; LUE AVF - no bruit today - seems small [name] no thrill; left IJ nontunneled HD catheter    Marlane Hatcher Julio Zappia 06/15/2022,6:52 AM  LOS: 16 days    Please remove nontunneled dialysis catheter from my standpoint.  Prioritize pacemaker then will likely end up needing a tunneled dialysis catheter   Claudia Desanctis, MD 7:16 AM 06/15/2022   Patient had 2723 mL UF overnight from [name].  Reviewed labs.    No acute indication for transition to IHD thankfully.  For now will continue with [name].  Hypotension today - transition back to 2.5% dextrose.    Will let pt know as well  Claudia Desanctis, MD 11:05 AM 06/15/2022

## 2022-06-15 NOTE — Progress Notes (Signed)
   Rounding Note    Patient Name: [name] [name] Date of Encounter: 06/15/2022  [place] Cardiologist: Pixie Casino, MD    EP now also following  Had some intermittent pacing throughout the past 24 hours. Most of these appear to be inappropriate pacing due to  non sensing of QRS   Will have EP review  If he needs a pacer, it would likely need to be a leadless pacer to minimize his risk of pacer infection in the event that he needs to have hemodialysis instead of PD .       Signed, Mertie Moores, MD  06/15/2022, 8:42 AM

## 2022-06-15 NOTE — Progress Notes (Signed)
Pt placed on home CPAP at 10:40 PM

## 2022-06-15 NOTE — Progress Notes (Addendum)
NAME:  [name] [name], MRN:  [id number], DOB:  09-16-1950, LOS: 31 ADMISSION DATE:  05/30/2022, CONSULTATION DATE: 06/05/2022 REFERRING MD: Dr. Kipp Brood, CHIEF COMPLAINT: S/p CABG  History of Present Illness:  71 year old male with end-stage renal disease on peritoneal dialysis, diabetes type 2, hypertension, hyperlipidemia, coronary artery disease, OSA and COPD, who was admitted with unstable angina, on work-up noted to have multivessel coronary artery disease underwent CABG x2, postop requiring vasopressor support and remain on full mechanical ventilatory support. Received 2 units of PRBCs in OR PCCM was consulted for help evaluation medical management  Pertinent  Medical History   Past Medical History:  Diagnosis Date   Anemia    Arthritis    Asthma    Bell palsy    CAD (coronary artery disease)    a. 2014 MV: abnl w/ infap ischemia; b. 03/2013 Cath: aneurysmal bleb in the LAD w/ otw nonobs dzs-->Med Rx.   Chronic back pain    Chronic knee pain    a. 09/2015 s/p R TKA.   Chronic pain    Chronic shoulder pain    Chronic sinusitis    COPD (chronic obstructive pulmonary disease) ([place])    Diabetes mellitus without complication ([place])    type II    ESRD on peritoneal dialysis ([place])    on peritoneal dialysis, [facility]   Essential hypertension    GERD (gastroesophageal reflux disease)    Gout    Gout    Hepatomegaly    noted on noncontrast CT 2015   History of hiatal hernia    Hyperlipidemia    Lateral meniscus tear    Obesity    Truncal   Obstructive sleep apnea    does not use cpap    On home oxygen therapy    uses 2l when is going somewhere per patient    PUD (peptic ulcer disease)    remote, reports f/u EGD about 8 years ago unremarkable    Reactive airway disease    related to exposure to chemical during 9/11   Sinusitis    Vitamin D deficiency      Significant Hospital Events: Including procedures, antibiotic start and stop dates in addition to other  pertinent events   11/14 admitted with NSTEMI 11/20 CABG 11/29 pacer turned down to backup rate 35  Interim History / Subjective:  No new complaints   Objective   Blood pressure (Abnormal) 74/41, pulse (Abnormal) 47, temperature (Significant) 99 F (37.2 C), temperature source Oral, resp. rate (Abnormal) 25, height '5\' 4"'$  (1.626 m), weight 105.6 kg, SpO2 94 %.        Intake/Output Summary (Last 24 hours) at 06/15/2022 1025 Last data filed at 06/15/2022 2094 Gross per 24 hour  Intake 694 ml  Output 2723 ml  Net -2029 ml   Filed Weights   06/12/22 0354 06/13/22 0500 06/14/22 0500  Weight: 104.1 kg 105.8 kg 105.6 kg    Examination: General resting in bed no distress HENT NCAT no JVD  Pulm clear and on room air  Card irreg bradycardic  Abd soft Ext warm LE edema  Neuro intact   Resolved Hospital Problem list   Cardiogenic shock, resolved Acute hypoxic respiratory failure due to pulmonary edema, currently on nasal cannula oxygen Acute delirium, improved Hyperkalemia, resolved  Assessment & Plan:   Acute NSTEMI - s/p 2-vessel CABG 11/20 Plan Cont post op care per CVTS Cont asa and statin  Holding BB given HB   Hypotension  -on high dose midodrine.  Ck lactate 250 ml bolus Cont midodrine  May need to increase HR on PPM   Mobitz type II heart block Hx HTN, HLD -Continue pacing wires per T CTS - Backup pacer decreased to rate of 30.   Plan Cont tele For possible PPM 12/1  Hx OSA/OHS - on nocturnal CPAP. Plan CPAP at HS   ESRD on PD - Appreciate nephrology's management.  Unsure if he long-term be able to maintain on peritoneal dialysis.  So far he has been having issues with volume removal Plan Per nephro. I wonder if we need to consider CRRT   Diabetes type 2, hyperglycemia - Continue Levemir 8 units daily - Sliding scale insulin as needed - Goal blood glucose less than 180, currently controlled.  Acute blood loss anemia, anemia of end-stage renal  disease Plan Trend cbc Trigger for transfusion < 7   Deconditioning Plan Cont PT/OT  Penis pain - unclear etiology. No clear wounds/lesions/rash, no discharge. Plan GC and Chlamydia probe pending   Best Practice (right click and "Reselect all SmartList Selections" daily)  Per primary team.    Erick Colace ACNP-BC [place] Pager # [phone] OR # [phone] if no answer

## 2022-06-15 NOTE — Progress Notes (Signed)
     [address]       [place] [address]             [phone]       EVENING ROUNDS  Not needing pacing at this point On VVI 35 Awaiting decision on PPM tomorrow

## 2022-06-15 NOTE — Progress Notes (Addendum)
[facility] DAILY ICU PROGRESS NOTE                   [address]            [place] [address]          [phone]   10 Days Post-Op Procedure(s) (LRB): OFF PUMP CORONARY ARTERY BYPASS GRAFTING (CABG) X 2 BYPASSES USING LEFT INTERNAL MAMMARY ARTERY AND RIGHT LEG GREATER SAPHENOUS VEIN HARVESTED ENDOSCOPICALLY (N/A) TRANSESOPHAGEAL ECHOCARDIOGRAM (TEE) (N/A)  Total Length of Stay:  LOS: 16 days   Subjective: Patient  sleeping this am;awakened. Left buttock pain he had yesterday improved this am. He states he had abdominal distention/discomfort with PD yesterday.  Objective: Vital signs in last 24 hours: Temp:  [98 F (36.7 C)-99.3 F (37.4 C)] 98.6 F (37 C) (11/29 1945) Pulse Rate:  [60-141] 74 (11/30 0100) Cardiac Rhythm: Heart block (11/29 2000) Resp:  [16-47] 16 (11/30 0600) BP: (76-126)/(48-97) 89/61 (11/30 0600) SpO2:  [88 %-99 %] 93 % (11/30 0100)  Filed Weights   06/12/22 0354 06/13/22 0500 06/14/22 0500  Weight: 104.1 kg 105.8 kg 105.6 kg      Intake/Output from previous day: 11/29 0701 - 11/30 0700 In: 794 [P.O.:784; I.V.:10] Out: 218   Intake/Output this shift: No intake/output data recorded.  Current Meds: Scheduled Meds:  aspirin EC  325 mg Oral Daily   atorvastatin  80 mg Oral Daily   bisacodyl  10 mg Oral Daily   Or   bisacodyl  10 mg Rectal Daily   Chlorhexidine Gluconate Cloth  6 each Topical Daily   darbepoetin (ARANESP) injection - DIALYSIS  150 mcg Subcutaneous Q Fri-1800   docusate sodium  200 mg Oral BID   feeding supplement (NEPRO CARB STEADY)  237 mL Oral Q1400   gentamicin cream  1 Application Topical Daily   gentamicin cream  1 Application Topical Daily   heparin injection (subcutaneous)  5,000 Units Subcutaneous Q8H   insulin aspart  0-5 Units Subcutaneous QHS   insulin aspart  0-9 Units Subcutaneous TID WC   insulin detemir  8 Units Subcutaneous Daily   melatonin  3 mg Oral QHS   methocarbamol  500 mg Oral TID    midodrine  15 mg Oral TID WC   oxybutynin  5 mg Oral TID   pantoprazole  40 mg Oral Daily   polyethylene glycol  17 g Oral Daily   polyethylene glycol  17 g Oral Once   QUEtiapine  25 mg Oral QHS   senna  2 tablet Oral Daily   sodium chloride flush  3 mL Intravenous Q12H   Continuous Infusions:  sodium chloride Stopped (06/11/22 0954)   dialysis solution 1.5% low-MG/low-CA     PRN Meds:.sodium chloride, albuterol, morphine injection, ondansetron (ZOFRAN) IV, mouth rinse, oxyCODONE, traMADol  General appearance: Cooperative and in no distress Neurologic: intact Heart: RRR Lungs: Slightly diminished bibasilar breath sounds Abdomen: Soft, non tender, mildly obese, bowel sounds present, PD cathter present Extremities: SCDs in place Wounds: Sternal and RLE wounds are clean and dry  Lab Results: CBC: Recent Labs    06/13/22 0323  WBC 7.4  HGB 7.8*  HCT 26.8*  PLT 178    BMET:  Recent Labs    06/13/22 0323 06/14/22 0531  NA 136 134*  K 3.8 3.9  CL 100 100  CO2 26 24  GLUCOSE 173* 140*  BUN 21 26*  CREATININE 7.16* 8.95*  CALCIUM 8.6* 8.8*  CMET: Lab Results  Component Value Date   WBC 7.4 06/13/2022   HGB 7.8 (L) 06/13/2022   HCT 26.8 (L) 06/13/2022   PLT 178 06/13/2022   GLUCOSE 140 (H) 06/14/2022   CHOL 88 06/03/2022   TRIG 98 06/03/2022   HDL 32 (L) 06/03/2022   LDLCALC 36 06/03/2022   ALT 14 06/03/2022   AST 20 06/03/2022   NA 134 (L) 06/14/2022   K 3.9 06/14/2022   CL 100 06/14/2022   CREATININE 8.95 (H) 06/14/2022   BUN 26 (H) 06/14/2022   CO2 24 06/14/2022   TSH 2.033 05/30/2022   INR 1.2 06/05/2022   HGBA1C 5.9 (H) 05/30/2022   MICROALBUR 7.4 02/09/2020    PT/INR: No results for input(s): "LABPROT", "INR" in the last 72 hours. Radiology: DG Abd Portable 1V  Result Date: 06/14/2022 CLINICAL DATA:  End-stage chronic kidney disease, peritoneal dialysis, chest pain, burning with urination. EXAM: PORTABLE ABDOMEN - 1 VIEW COMPARISON:  CT  abdomen pelvis 09/24/2019. FINDINGS: Bowel gas pattern is unremarkable. No radiopaque calculi. A 4.2 cm vertically oriented tubular radiodense structure is seen to the left of L4 and L5, representing the metallic portion of a dialysis catheter. Small bilateral pleural effusions and bibasilar atelectasis. IMPRESSION: 1. No acute findings in the abdomen. 2. Small bilateral pleural effusions and bibasilar atelectasis. Electronically Signed   By: Lorin Picket M.D.   On: 06/14/2022 08:51     Assessment/Plan: S/P Procedure(s) (LRB): OFF PUMP CORONARY ARTERY BYPASS GRAFTING (CABG) X 2 BYPASSES USING LEFT INTERNAL MAMMARY ARTERY AND RIGHT LEG GREATER SAPHENOUS VEIN HARVESTED ENDOSCOPICALLY (N/A) TRANSESOPHAGEAL ECHOCARDIOGRAM (TEE) (N/A) CV-Previously, intermittently paced. Has second degree HB. Put on  back up pacer at 58 yesterday. Per EP evaluation yesterday, hope to avoid permanent pacing, but if ends up needed, would likely require Leadless to minimize risk of infection. Await EP evaluation this am. On Midodrine 15 mg tid for hypotension Pulmonary-on room air. History of OSA-refused CPAP last night. PA/LAT CXR this am appears stable (cardiomegaly, bibasilar atelectasis). Encourage incentive spirometer ESRD-Nephrology arranging for dialysis. He has a PD catheter and LUE AV fistula but likely not functional.  Blood loss anemia/anemia of chronic disease-H and H 11/28 7.8 and 26.8. On Aranesp every Friday. Await this am's result 5. DM-CBGs 115/128/171. On Insulin. Pre op HGA1C 5.9. Will restart Tradjenta later in hospital course 6. Regarding left buttock pain-no evidence of wound. ? Sciatica. Unable to give NSAIDS secondary to ESRD 7. To remain in ICU   Donielle Liston Alba PA-C 06/15/2022 7:09 AM  HR dropped to low 40-50 yesterday.  Brief episode Pacer cut back up to 50 Hemodynamically stable Awaiting final EP recs, if no plans for pacer, will remove epicardial wires, and transfer to  floor Nephrology leaning towards hemodialysis  Anzleigh Slaven Bary Leriche

## 2022-06-15 NOTE — Progress Notes (Addendum)
Electrophysiology Rounding Note  Patient Name: [name] [name] Date of Encounter: 06/15/2022  Primary Cardiologist: Pixie Casino, MD Electrophysiologist: New   Subjective   Getting PD up until 0900 this am.    Pacer turned up to 50 VVI yesterday for brief periods of non-sustained bradycardia  Inpatient Medications    Scheduled Meds:  aspirin EC  325 mg Oral Daily   atorvastatin  80 mg Oral Daily   bisacodyl  10 mg Oral Daily   Or   bisacodyl  10 mg Rectal Daily   Chlorhexidine Gluconate Cloth  6 each Topical Daily   darbepoetin (ARANESP) injection - DIALYSIS  150 mcg Subcutaneous Q Fri-1800   docusate sodium  200 mg Oral BID   feeding supplement (NEPRO CARB STEADY)  237 mL Oral Q1400   gentamicin cream  1 Application Topical Daily   gentamicin cream  1 Application Topical Daily   heparin injection (subcutaneous)  5,000 Units Subcutaneous Q8H   insulin aspart  0-5 Units Subcutaneous QHS   insulin aspart  0-9 Units Subcutaneous TID WC   insulin detemir  8 Units Subcutaneous Daily   melatonin  3 mg Oral QHS   methocarbamol  500 mg Oral TID   midodrine  15 mg Oral TID WC   oxybutynin  5 mg Oral TID   pantoprazole  40 mg Oral Daily   polyethylene glycol  17 g Oral Daily   polyethylene glycol  17 g Oral Once   QUEtiapine  25 mg Oral QHS   senna  2 tablet Oral Daily   sodium chloride flush  3 mL Intravenous Q12H   Continuous Infusions:  sodium chloride Stopped (06/11/22 0954)   dialysis solution 1.5% low-MG/low-CA     PRN Meds: sodium chloride, albuterol, morphine injection, ondansetron (ZOFRAN) IV, mouth rinse, oxyCODONE, traMADol   Vital Signs    Vitals:   06/15/22 0730 06/15/22 0745 06/15/22 0800 06/15/22 0815  BP:   (!) 80/66   Pulse: 66 68 (!) 50 63  Resp: (!) 30 (!) 35 (!) 27 (!) 23  Temp:      TempSrc:      SpO2: 95% 93% 98% 93%  Weight:      Height:        Intake/Output Summary (Last 24 hours) at 06/15/2022 0858 Last data filed at 06/15/2022  0200 Gross per 24 hour  Intake 794 ml  Output --  Net 794 ml   Filed Weights   06/12/22 0354 06/13/22 0500 06/14/22 0500  Weight: 104.1 kg 105.8 kg 105.6 kg    Physical Exam    GEN- The patient is chronically ill appearing, alert and oriented x 3 today.   Head- normocephalic, atraumatic Eyes-  Sclera clear, conjunctiva pink Ears- hearing intact Oropharynx- clear Neck- supple Lungs- Clear to ausculation bilaterally, normal work of breathing Heart-  Irregular  rate and rhythm due to intermittent skipped beats and wenckebach, no murmurs, rubs or gallops GI- soft, NT, ND, + BS Extremities- no clubbing or cyanosis. No edema Skin- no rash or lesion Psych- euthymic mood, full affect Neuro- strength and sensation are intact  Labs    CBC Recent Labs    06/13/22 0323 06/15/22 0615  WBC 7.4 6.4  HGB 7.8* 7.8*  HCT 26.8* 26.0*  MCV 103.1* 101.6*  PLT 178 570   Basic Metabolic Panel Recent Labs    06/14/22 0531 06/15/22 0615  NA 134* 137  K 3.9 3.9  CL 100 100  CO2 24 26  GLUCOSE 140* 153*  BUN 26* 29*  CREATININE 8.95* 10.65*  CALCIUM 8.8* 9.1  MG 2.4 2.5*  PHOS 3.9 4.6   Liver Function Tests Recent Labs    06/14/22 0531 06/15/22 0615  ALBUMIN 2.2* 2.3*   No results for input(s): "LIPASE", "AMYLASE" in the last 72 hours. Cardiac Enzymes No results for input(s): "CKTOTAL", "CKMB", "CKMBINDEX", "TROPONINI" in the last 72 hours.   Telemetry    NSR 50-80s, Has intermittent Second degree AV block. Brief periods of 2:1 followed by Mobitz 1 and sinus rhythm in 60-80s as well. No sustained bradycardia.  Brief period of "slow" pacing earlier today appears to possibly be undersensing on the monitor. (personally reviewed)  Radiology    DG CHEST PORT 1 VIEW  Result Date: 06/15/2022 CLINICAL DATA:  Recent CABG. EXAM: PORTABLE CHEST 1 VIEW COMPARISON:  CXR 06/09/22 FINDINGS: Interval removal of the right-sided central venous catheter sheath. Left-sided central  venous catheter is unchanged in positioning likely terminating near the confluence of the brachiocephalic veins. Status post median sternotomy and CABG. Unchanged cardiac and mediastinal contours. Likely trace left pleural effusion. Low lung volumes. No focal airspace opacity. Prominent interstitial opacities are favored to represent bronchovascular crowding in the setting of low lung volumes. No pneumothorax. Visualized upper abdomen is unremarkable. No displaced rib fractures. IMPRESSION: Possible trace left pleural effusion. Electronically Signed   By: Marin Roberts M.D.   On: 06/15/2022 08:04   DG Abd Portable 1V  Result Date: 06/14/2022 CLINICAL DATA:  End-stage chronic kidney disease, peritoneal dialysis, chest pain, burning with urination. EXAM: PORTABLE ABDOMEN - 1 VIEW COMPARISON:  CT abdomen pelvis 09/24/2019. FINDINGS: Bowel gas pattern is unremarkable. No radiopaque calculi. A 4.2 cm vertically oriented tubular radiodense structure is seen to the left of L4 and L5, representing the metallic portion of a dialysis catheter. Small bilateral pleural effusions and bibasilar atelectasis. IMPRESSION: 1. No acute findings in the abdomen. 2. Small bilateral pleural effusions and bibasilar atelectasis. Electronically Signed   By: Lorin Picket M.D.   On: 06/14/2022 08:51    Patient Profile     KOEHN [name] [name] is a 71 y.o. male with a hx of IDDM, HTN, HLD, CAD (prior DES to RCA Jan 2023 > June 2023 >> July 2023)ESRF on PD, OSA,  who is being seen 06/07/2022 for the evaluation of persistent heart block at the request of Dr. Kipp Brood.    Assessment & Plan    Advanced AV block Mobitz one with 2:1 and known nocturnal CHB at his baseline and has been relatively asymptomatic.  His pacer was turned up yesterday to 50 VVI, though before hand he had no sustained bradycardia. He had 2:1 and mobitz 1 periods of 5-7 beats at a time.  He has multiple indwelling catheters and there is some concern he Saloma Cadena need  to transition back to HD.  His risk of infection is significantly elevated.  Please leave temp pacer turned down to 35 VVI.  If he does not have significant sustained bradycardia, we hope to avoid pacing.   We have tentatively saved him a spot tomorrow, since a leadless pacemaker would require anaesthesia.  If pt has no sustained or significant bradycardia, we would plan to avoid permanent pacing due to his increased risk of complications.   For questions or updates, please contact [place] Please consult [email or web address] for contact info under Cardiology/STEMI.  Signed, Shirley Friar, PA-C  06/15/2022, 8:58 AM   I have seen and examined this patient with  [name].  Agree with above, note added to reflect my findings.  Patient sleeping today, and otherwise no complaints  GEN: Well nourished, well developed, in no acute distress  HEENT: normal  Neck: no JVD, carotid bruits, or masses Cardiac: RRR; no murmurs, rubs, or gallops,no edema  Respiratory:  clear to auscultation bilaterally, normal work of breathing GI: soft, nontender, nondistended, + BS MS: no deformity or atrophy  Skin: warm and dry, Neuro:  Strength and sensation are intact Psych: euthymic mood, full affect   Advanced heart block: Patient has been having 2-1 AV block.  Most of this occurs at night when the patient would likely be sleeping.  His pacing wire was turned down to 35 yesterday, but was turned back up.  He has been pacing at 50.  I have trended back down to 35.  Hopefully we can reassess his conduction throughout the day today and night tonight.  He should not be symptomatic of his heart rates are 35, additionally, if he has bradycardia overnight, would continue to hold off on turning up the pacemaker.  Hopefully we can avoid pacemaker implant.  If he does continue to have episodes of bradycardia during waking hours, would potentially need pacemaker implant.  Stark Aguinaga M. Steadman Prosperi MD 06/15/2022 10:26 AM

## 2022-06-16 ENCOUNTER — Encounter (HOSPITAL_COMMUNITY)
Admission: EM | Disposition: A | Discharge status: Home Health | Payer: Self-pay | Source: Home / Self Care | Attending: Thoracic Surgery (Cardiothoracic Vascular Surgery)

## 2022-06-16 DIAGNOSIS — D509 Iron deficiency anemia, unspecified: Secondary | ICD-10-CM | POA: Diagnosis not present

## 2022-06-16 DIAGNOSIS — N250 Renal osteodystrophy: Secondary | ICD-10-CM | POA: Diagnosis not present

## 2022-06-16 DIAGNOSIS — I25118 Atherosclerotic heart disease of native coronary artery with other forms of angina pectoris: Secondary | ICD-10-CM

## 2022-06-16 DIAGNOSIS — D631 Anemia in chronic kidney disease: Secondary | ICD-10-CM | POA: Diagnosis not present

## 2022-06-16 DIAGNOSIS — Z992 Dependence on renal dialysis: Secondary | ICD-10-CM | POA: Diagnosis not present

## 2022-06-16 DIAGNOSIS — R739 Hyperglycemia, unspecified: Secondary | ICD-10-CM | POA: Diagnosis not present

## 2022-06-16 DIAGNOSIS — I441 Atrioventricular block, second degree: Secondary | ICD-10-CM | POA: Diagnosis not present

## 2022-06-16 DIAGNOSIS — N186 End stage renal disease: Secondary | ICD-10-CM | POA: Diagnosis not present

## 2022-06-16 DIAGNOSIS — I214 Non-ST elevation (NSTEMI) myocardial infarction: Secondary | ICD-10-CM | POA: Diagnosis not present

## 2022-06-16 LAB — RENAL FUNCTION PANEL
Albumin: 2.5 g/dL — ABNORMAL LOW (ref 3.5–5.0)
Anion gap: 14 (ref 5–15)
BUN: 32 mg/dL — ABNORMAL HIGH (ref 8–23)
CO2: 24 mmol/L (ref 22–32)
Calcium: 9.4 mg/dL (ref 8.9–10.3)
Chloride: 98 mmol/L (ref 98–111)
Creatinine, Ser: 12.15 mg/dL — ABNORMAL HIGH (ref 0.61–1.24)
GFR, Estimated: 4 mL/min — ABNORMAL LOW (ref >60)
Glucose, Bld: 129 mg/dL — ABNORMAL HIGH (ref 70–99)
Phosphorus: 4.9 mg/dL — ABNORMAL HIGH (ref 2.5–4.6)
Potassium: 3.9 mmol/L (ref 3.5–5.1)
Sodium: 136 mmol/L (ref 135–145)

## 2022-06-16 LAB — GLUCOSE, CAPILLARY
Glucose-Capillary: 126 mg/dL — ABNORMAL HIGH (ref 70–99)
Glucose-Capillary: 136 mg/dL — ABNORMAL HIGH (ref 70–99)
Glucose-Capillary: 133 mg/dL — ABNORMAL HIGH (ref 70–99)
Glucose-Capillary: 108 mg/dL — ABNORMAL HIGH (ref 70–99)

## 2022-06-16 LAB — MAGNESIUM: Magnesium: 2.5 mg/dL — ABNORMAL HIGH (ref 1.7–2.4)

## 2022-06-16 SURGERY — PACEMAKER LEADLESS INSERTION
Anesthesia: General

## 2022-06-16 MED ORDER — POLYETHYLENE GLYCOL 3350 17 G PO PACK
17.0000 g | PACK | Freq: Once | ORAL | Status: AC
  Administered 2022-06-16: 17 g via ORAL
  Filled 2022-06-16: qty 1

## 2022-06-16 MED ORDER — DARBEPOETIN ALFA 200 MCG/0.4ML IJ SOSY
200.0000 ug | PREFILLED_SYRINGE | INTRAMUSCULAR | Status: DC
  Administered 2022-06-16: 200 ug via SUBCUTANEOUS
  Filled 2022-06-16: qty 0.4

## 2022-06-16 MED ORDER — SODIUM CHLORIDE 0.9 % IV SOLN: 250.0000 mL | INTRAVENOUS | Status: DC | PRN

## 2022-06-16 MED ORDER — SODIUM CHLORIDE 0.9% FLUSH: 3.0000 mL | INTRAVENOUS | Status: DC | PRN

## 2022-06-16 MED ORDER — ~~LOC~~ CARDIAC SURGERY, PATIENT & FAMILY EDUCATION: Freq: Once | Status: AC

## 2022-06-16 MED ORDER — SEVELAMER CARBONATE 800 MG PO TABS
800.0000 mg | ORAL_TABLET | Freq: Three times a day (TID) | ORAL | Status: DC
  Administered 2022-06-16 – 2022-06-17 (×5): 800 mg via ORAL
  Filled 2022-06-16 (×7): qty 1

## 2022-06-16 MED ORDER — SODIUM CHLORIDE 0.9% FLUSH
3.0000 mL | Freq: Two times a day (BID) | INTRAVENOUS | Status: DC
  Administered 2022-06-16 – 2022-06-19 (×5): 3 mL via INTRAVENOUS

## 2022-06-16 NOTE — Progress Notes (Addendum)
Electrophysiology Rounding Note  Patient Name: [name] [name] Date of Encounter: 06/16/2022  Primary Cardiologist: Pixie Casino, MD Electrophysiologist: None   Subjective   Feeling OK this am, sitting up in chair.  Inpatient Medications    Scheduled Meds:  aspirin EC  325 mg Oral Daily   atorvastatin  80 mg Oral Daily   bisacodyl  10 mg Oral Daily   Or   bisacodyl  10 mg Rectal Daily   Chlorhexidine Gluconate Cloth  6 each Topical Daily   darbepoetin (ARANESP) injection - DIALYSIS  200 mcg Subcutaneous Q Fri-1800   docusate sodium  200 mg Oral BID   feeding supplement (NEPRO CARB STEADY)  237 mL Oral BID BM   gentamicin cream  1 Application Topical Daily   gentamicin cream  1 Application Topical Daily   insulin aspart  0-5 Units Subcutaneous QHS   insulin aspart  0-9 Units Subcutaneous TID WC   insulin detemir  8 Units Subcutaneous Daily   melatonin  3 mg Oral QHS   methocarbamol  500 mg Oral TID   midodrine  15 mg Oral TID WC   oxybutynin  5 mg Oral TID   pantoprazole  40 mg Oral Daily   polyethylene glycol  17 g Oral Daily   polyethylene glycol  17 g Oral Once   QUEtiapine  25 mg Oral QHS   senna  2 tablet Oral Daily   sevelamer carbonate  800 mg Oral TID WC   sodium chloride flush  3 mL Intravenous Q12H   Continuous Infusions:  sodium chloride Stopped (06/11/22 0954)   sodium chloride     dialysis solution 1.5% low-MG/low-CA     dialysis solution 2.5% low-MG/low-CA     PRN Meds: sodium chloride, sodium chloride, albuterol, heparin sodium (porcine) 3,000 Units in dialysis solution 2.5% low-MG/low-CA 6,000 mL dialysis solution, morphine injection, ondansetron (ZOFRAN) IV, mouth rinse, oxyCODONE, traMADol   Vital Signs    Vitals:   06/16/22 0400 06/16/22 0500 06/16/22 0600 06/16/22 0700  BP: (!) 89/65 (!) 83/61    Pulse: 73     Resp: 20 20 (!) 21   Temp:    100 F (37.8 C)  TempSrc:    Oral  SpO2: 91%  91%   Weight:      Height:         Intake/Output Summary (Last 24 hours) at 06/16/2022 0842 Last data filed at 06/16/2022 4268 Gross per 24 hour  Intake 700.6 ml  Output 5210 ml  Net -4509.4 ml   Filed Weights   06/13/22 0500 06/14/22 0500 06/16/22 0330  Weight: 105.8 kg 105.6 kg 105.5 kg    Physical Exam    GEN- The patient is well appearing, alert and oriented x 3 today.   HEENT- No gross abnormality.  Lungs- Clear to ausculation bilaterally, normal work of breathing Heart-  Somewhat irregular at times due to Mobitz 1 , no murmurs, rubs or gallops GI- soft, NT, ND, + BS Extremities- no clubbing or cyanosis. No edema Neuro- No obvious focal abnormality.   Labs    CBC Recent Labs    06/15/22 0615  WBC 6.4  HGB 7.8*  HCT 26.0*  MCV 101.6*  PLT 341   Basic Metabolic Panel Recent Labs    06/15/22 0615 06/16/22 0655  NA 137 136  K 3.9 3.9  CL 100 98  CO2 26 24  GLUCOSE 153* 129*  BUN 29* 32*  CREATININE 10.65* 12.15*  CALCIUM 9.1 9.4  MG 2.5* 2.5*  PHOS 4.6 4.9*   Liver Function Tests Recent Labs    06/15/22 0615 06/16/22 0655  ALBUMIN 2.3* 2.5*   No results for input(s): "LIPASE", "AMYLASE" in the last 72 hours. Cardiac Enzymes No results for input(s): "CKTOTAL", "CKMB", "CKMBINDEX", "TROPONINI" in the last 72 hours.   Telemetry    NSR 70-80s, periods of 2:1 and Mobitz 1, Brief periods of 3-4 paced beats during nighttime hours (personally reviewed)  Radiology    DG CHEST PORT 1 VIEW  Result Date: 06/15/2022 CLINICAL DATA:  Recent CABG. EXAM: PORTABLE CHEST 1 VIEW COMPARISON:  CXR 06/09/22 FINDINGS: Interval removal of the right-sided central venous catheter sheath. Left-sided central venous catheter is unchanged in positioning likely terminating near the confluence of the brachiocephalic veins. Status post median sternotomy and CABG. Unchanged cardiac and mediastinal contours. Likely trace left pleural effusion. Low lung volumes. No focal airspace opacity. Prominent interstitial  opacities are favored to represent bronchovascular crowding in the setting of low lung volumes. No pneumothorax. Visualized upper abdomen is unremarkable. No displaced rib fractures. IMPRESSION: Possible trace left pleural effusion. Electronically Signed   By: Marin Roberts M.D.   On: 06/15/2022 08:04    Patient Profile     [name] [name] is a 71 y.o. male with a hx of IDDM, HTN, HLD, CAD (prior DES to RCA Jan 2023 > June 2023 >> July 2023)ESRF on PD, OSA,  who is being seen 06/07/2022 for the evaluation of persistent heart block at the request of Dr. Kipp Brood.     Assessment & Plan    Advanced AV block Mobitz one with 2:1 and known nocturnal CHB at his baseline and has been relatively asymptomatic.  He had brief periods of nocturnal pacing consistent with nocturnal pauses which were true at his baseline and at this time are not an indication for permanent pacing.  Given lack of daytime pacing or symptoms, we do not plan for permanent pacing at this time.  He has multiple indwelling catheters and there is some concern he Imre Vecchione need to transition back to HD.  His risk of infection is significantly elevated.   Dr. Curt Bears has seen.    For questions or updates, please contact [place] Please consult [email or web address] for contact info under Cardiology/STEMI.  Signed, Shirley Friar, PA-C  06/16/2022, 8:42 AM   I have seen and examined this patient with [name].  Agree with above, note added to reflect my findings.  Patient feeling well today.  Sitting in a chair.  GEN: Well nourished, well developed, in no acute distress  HEENT: normal  Neck: no JVD, carotid bruits, or masses Cardiac: RRR; no murmurs, rubs, or gallops,no edema  Respiratory:  clear to auscultation bilaterally, normal work of breathing GI: soft, nontender, nondistended, + BS MS: no deformity or atrophy  Skin: warm and dry Neuro:  Strength and sensation are intact Psych: euthymic mood, full affect    Second-degree AV block: In review of telemetry, he has not required any ventricular pacing during waking hours.  He did require ventricular pacing, but he states that he was sleeping at the time.  As he is having nocturnal pauses, no indication for pacemaker.  If he does develop significant pauses while awake, pacemaker implant would be indicated.  Arshan Jabs M. Shreyan Hinz MD 06/16/2022 9:57 AM

## 2022-06-16 NOTE — Progress Notes (Signed)
CT surgery PM rounds  Patient has stable day but did not ambulate Heart rate and rhythm stable-EP has decided patient does not meet criteria for permanent pacemaker. Peritoneal dialysis planned for this evening.  Blood pressure 100/70, pulse 69, temperature 98.7 F (37.1 C), temperature source Oral, resp. rate (!) 24, height '5\' 4"'$  (1.626 m), weight 105.5 kg, SpO2 96 %.

## 2022-06-16 NOTE — Progress Notes (Addendum)
[facility] DAILY ICU PROGRESS NOTE                   [address]            [place], [address]          [phone]   11 Days Post-Op Procedure(s) (LRB): OFF PUMP CORONARY ARTERY BYPASS GRAFTING (CABG) X 2 BYPASSES USING LEFT INTERNAL MAMMARY ARTERY AND RIGHT LEG GREATER SAPHENOUS VEIN HARVESTED ENDOSCOPICALLY (N/A) TRANSESOPHAGEAL ECHOCARDIOGRAM (TEE) (N/A)  Total Length of Stay:  LOS: 17 days   Subjective: Patient sitting in chair this am. He has no specific complaint this am.  Objective: Vital signs in last 24 hours: Temp:  [98.3 F (36.8 C)-100 F (37.8 C)] 100 F (37.8 C) (12/01 0700) Pulse Rate:  [45-131] 73 (12/01 0400) Cardiac Rhythm: Heart block;Bundle branch block;Ventricular paced (11/30 2101) Resp:  [11-62] 21 (12/01 0600) BP: (70-128)/(41-104) 83/61 (12/01 0500) SpO2:  [72 %-100 %] 91 % (12/01 0600) Weight:  [105.5 kg] 105.5 kg (12/01 0330)  Filed Weights   06/13/22 0500 06/14/22 0500 06/16/22 0330  Weight: 105.8 kg 105.6 kg 105.5 kg      Intake/Output from previous day: 11/30 0701 - 12/01 0700 In: 937.6 [P.O.:811; IV Piggyback:126.6] Out: 5210   Intake/Output this shift: No intake/output data recorded.  Current Meds: Scheduled Meds:  aspirin EC  325 mg Oral Daily   atorvastatin  80 mg Oral Daily   bisacodyl  10 mg Oral Daily   Or   bisacodyl  10 mg Rectal Daily   Chlorhexidine Gluconate Cloth  6 each Topical Daily   darbepoetin (ARANESP) injection - DIALYSIS  200 mcg Subcutaneous Q Fri-1800   docusate sodium  200 mg Oral BID   feeding supplement (NEPRO CARB STEADY)  237 mL Oral BID BM   gentamicin cream  1 Application Topical Daily   gentamicin cream  1 Application Topical Daily   insulin aspart  0-5 Units Subcutaneous QHS   insulin aspart  0-9 Units Subcutaneous TID WC   insulin detemir  8 Units Subcutaneous Daily   melatonin  3 mg Oral QHS   methocarbamol  500 mg Oral TID   midodrine  15 mg Oral TID WC   oxybutynin  5 mg Oral  TID   pantoprazole  40 mg Oral Daily   polyethylene glycol  17 g Oral Daily   polyethylene glycol  17 g Oral Once   QUEtiapine  25 mg Oral QHS   senna  2 tablet Oral Daily   sevelamer carbonate  800 mg Oral TID WC   sodium chloride flush  3 mL Intravenous Q12H   Continuous Infusions:  sodium chloride Stopped (06/11/22 0954)   sodium chloride     dialysis solution 1.5% low-MG/low-CA     dialysis solution 2.5% low-MG/low-CA     PRN Meds:.sodium chloride, sodium chloride, albuterol, heparin sodium (porcine) 3,000 Units in dialysis solution 2.5% low-MG/low-CA 6,000 mL dialysis solution, morphine injection, ondansetron (ZOFRAN) IV, mouth rinse, oxyCODONE, traMADol  General appearance: Cooperative and in no distress Heart: RRR Lungs: Slightly diminished bibasilar breath sounds L>R Abdomen: Soft, non tender, mildly obese, bowel sounds present, PD cathter present Extremities: SCDs in place Wounds: Sternal and RLE wounds are clean and dry  Lab Results: CBC: Recent Labs    06/15/22 0615  WBC 6.4  HGB 7.8*  HCT 26.0*  PLT 211    BMET:  Recent Labs    06/14/22 0531 06/15/22 0615  NA 134* 137  K 3.9 3.9  CL 100 100  CO2 24 26  GLUCOSE 140* 153*  BUN 26* 29*  CREATININE 8.95* 10.65*  CALCIUM 8.8* 9.1     CMET: Lab Results  Component Value Date   WBC 6.4 06/15/2022   HGB 7.8 (L) 06/15/2022   HCT 26.0 (L) 06/15/2022   PLT 211 06/15/2022   GLUCOSE 153 (H) 06/15/2022   CHOL 88 06/03/2022   TRIG 98 06/03/2022   HDL 32 (L) 06/03/2022   LDLCALC 36 06/03/2022   ALT 14 06/03/2022   AST 20 06/03/2022   NA 137 06/15/2022   K 3.9 06/15/2022   CL 100 06/15/2022   CREATININE 10.65 (H) 06/15/2022   BUN 29 (H) 06/15/2022   CO2 26 06/15/2022   TSH 2.033 05/30/2022   INR 1.2 06/05/2022   HGBA1C 5.9 (H) 05/30/2022   MICROALBUR 7.4 02/09/2020    PT/INR: No results for input(s): "LABPROT", "INR" in the last 72 hours. Radiology: No results found.   Assessment/Plan: S/P  Procedure(s) (LRB): OFF PUMP CORONARY ARTERY BYPASS GRAFTING (CABG) X 2 BYPASSES USING LEFT INTERNAL MAMMARY ARTERY AND RIGHT LEG GREATER SAPHENOUS VEIN HARVESTED ENDOSCOPICALLY (N/A) TRANSESOPHAGEAL ECHOCARDIOGRAM (TEE) (N/A) CV-Previously, intermittently paced. Has second degree HB. On  back up pacer at 35. Await EP evaluation this am to see if need PPM. On Midodrine 15 mg tid for hypotension Pulmonary-on room air. History of OSA-refused CPAP last night. PA/LAT CXR this am appears stable (cardiomegaly, bibasilar atelectasis). Encourage incentive spirometer ESRD-Nephrology arranging for dialysis. He has a PD catheter and LUE AV fistula but likely not functional. PD machine to be changed to see if better UF. Per nephrology, await renal function panel this am and he might need HD. Blood loss anemia/anemia of chronic disease-H and H 11/30 7.8 and 26. On Aranesp every Friday.  5. DM-CBGs 123/123/126. On Insulin. Pre op HGA1C 5.9. Will restart Tradjenta later in hospital course 6. Regarding left buttock pain-no evidence of wound. ? Sciatica. Unable to give NSAIDS secondary to ESRD 7. To remain in ICU   Donielle Liston Alba PA-C 06/16/2022 7:14 AM    Agree with above. No plans for pacemaker at this point. Will keep wires in place for now. Will transfer to the floor.  Sarha Bartelt Bary Leriche

## 2022-06-16 NOTE — Progress Notes (Signed)
[facility] NEPHROLOGY PROGRESS NOTE  Assessment/ Plan: [name] [name] is a 71 y.o. yo male   with past medical history significant for OSA, COPD, type II DM, CAD, ESRD on PD, anemia who was presented with chest pain, and NSTEMI, seen as a consultation for the management of ESRD.   Dialysis Orders from previous admission in 01/2022.: [facility] therapies patient.  CCPD, 5 exchanges 3 liter fill volumes, 10 min fill time, 1 hour 20 minutes dwell, 20 minute drain.  Last fill Icodextran.  EDW 103 kg. Uses combination of 1.5% and 2.5% depending upon BP and weight    # NSTEMI/unstable angina: Cardiac cath on 11/14 with severe in in-stent restenosis in the right coronary artery and proximal LAD disease.  Underwent CABG on 11/20.  Per CT Surgery   # ESRD on PD: CCPD at night, 5 exchanges, 2-2.5 L fill volume, mix of 1.5 and 2.5% solution.  He does have left upper extremity AV fistula but likely not fully functional. Patient was complaining of abdominal distention and difficulty with 2.5 L of fill volume on 11/17.  We lowered fill volume to 2 L and he tolerated well.  Held RRT on 11/20 but then started CRRT on 11/21.  Temporary dialysis catheter placed by CCM.  PD catheter flushed 11/26 am ------------------ - I am concerned that he will need to do HD - await today's labs and overnight UF  - await today's labs  - we are changing out his PD machine - Continue PD tonight - intermittently not getting great UF with PD.  Tonight will try 4.25 and 2.5% dextrose again to see if improves UF (the last time we did this he did have more UF).    - He is on miralax - give an additional dose today.  He hasn't gotten the additional dose both times that I have ordered it    # Hypotension: Pressors per primary - now off.  Note patient on midodrine    # Anemia of ESRD: Iron saturation 27 with high ferritin.  Increase aranesp to 200 mcg every Friday.  Received transfusion with surgery per  charting  #CKD-MBD:  Continue calcitriol.  Resume sevelamer  # heart block - per cardiology and CTS.  Anticipate pacemaker   Disposition - continue inpatient monitoring   -------------------------------  Subjective: Patient had just 236 mL UF overnight from PD - he's done but still connected.  Dialysis RN is here to assess him.  There was concern for some machine data being lost but this does not appear to be the case.  His dialysis catheter was removed yesterday.  Per RN anticipate pacemaker as bradycardia overnight but no definite scheduled plans today.   States BM yesterday and RN reports overnight.  He hasn't been getting the miralax and RN states [name] [name] refusing.  I spoke with him specifically about this.  HD RN is changing out his PD machine.    UF trends  11/30 pm - 236 mL  11/29 pm - 2723 mL (verbally confirmed with RN) 11/28 pm - 218 ml 11/27 pm - 648 ml 11/19 pm - 894 ml 11/17 pm - 1352 mL 11/16 pm - 100 mL  Review of systems:     He reports some shortness of breath  Denies chest pain  Denies n/v Having BM's   Objective Vital signs in last 24 hours: Vitals:   06/16/22 0300 06/16/22 0330 06/16/22 0353 06/16/22 0400  BP: (!) 128/104   (!) 89/65  Pulse:  73  Resp: '15 15 18 20  '$ Temp:      TempSrc:      SpO2: 94% 92% 97% 91%  Weight:  105.5 kg    Height:  '5\' 4"'$  (1.626 m)     Weight change:   Intake/Output Summary (Last 24 hours) at 06/16/2022 0623 Last data filed at 06/15/2022 2046 Gross per 24 hour  Intake 937.6 ml  Output 2723 ml  Net -1785.4 ml    Labs: RENAL PANEL Recent Labs    06/06/22 1651 06/07/22 0344 06/07/22 0612 06/08/22 0415 06/08/22 1510 06/09/22 0405 06/09/22 1602 06/10/22 0421 06/10/22 1600 06/11/22 0417 06/11/22 1642 06/12/22 0341 06/13/22 0323 06/14/22 0531 06/15/22 0615  NA 136  135 137   < > 139   < > 140 139 136 136 136 139 138 136 134* 137  K 5.2*  5.2* 4.2   < > 4.5   < > 4.1 4.1 4.1 4.0 4.0 4.3 4.0 3.8 3.9 3.9  CL  101  101 102   < > 103   < > '102 103 101 104 102 104 98 100 100 100 '$  CO2 22  22 20*   < > 24   < > '26 24 25 26 26 26 23 26 24 26  '$ GLUCOSE 129*  130* 134*   < > 126*   < > 105* 109* 98 132* 117* 156* 148* 173* 140* 153*  BUN 47*  47* 30*   < > 19   < > '15 15 12 11 10 12 17 21 '$ 26* 29*  CREATININE 16.63*  16.95* 11.40*   < > 6.08*   < > 4.30* 3.67* 3.09* 2.99* 2.98* 3.65* 5.23* 7.16* 8.95* 10.65*  CALCIUM 7.7*  7.6* 7.8*   < > 8.6*   < > 8.5* 8.0* 8.0* 8.0* 7.9* 8.4* 8.6* 8.6* 8.8* 9.1  MG 2.0 2.0  --  2.4  --  2.4  --  2.5*  --  2.4  --  2.5* 2.6* 2.4 2.5*  PHOS 4.6 3.7   < > 2.8   < > 2.2* 4.3 2.4* 2.7 2.4* 2.3* 2.5 3.8 3.9 4.6  ALBUMIN 2.7* 2.7*   < > 2.5*   < > 2.2* 2.3* 2.2* 2.2* 2.2* 2.4* 2.3* 2.5* 2.2* 2.3*   < > = values in this interval not displayed.     Liver Function Tests: Recent Labs  Lab 06/13/22 0323 06/14/22 0531 06/15/22 0615  ALBUMIN 2.5* 2.2* 2.3*   No results for input(s): "LIPASE", "AMYLASE" in the last 168 hours. No results for input(s): "AMMONIA" in the last 168 hours. CBC: Recent Labs    06/02/22 0312 06/03/22 0648 06/10/22 0421 06/11/22 0417 06/12/22 0342 06/13/22 0323 06/15/22 0615  HGB 8.5*   < > 8.0* 8.0* 7.7* 7.8* 7.8*  MCV 95.4   < > 96.6 97.0 99.6 103.1* 101.6*  FERRITIN 1,105*  --   --   --   --   --   --   TIBC 293  --   --   --   --   --   --   IRON 79  --   --   --   --   --   --    < > = values in this interval not displayed.    Cardiac Enzymes: No results for input(s): "CKTOTAL", "CKMB", "CKMBINDEX", "TROPONINI" in the last 168 hours. CBG: Recent Labs  Lab 06/14/22 1620 06/14/22 2137 06/15/22 1119 06/15/22 1547 06/15/22 2042  GLUCAP 128* 171* 121* 123* 123*    Iron Studies: No results for input(s): "IRON", "TIBC", "TRANSFERRIN", "FERRITIN" in the last 72 hours. Studies/Results: DG CHEST PORT 1 VIEW  Result Date: 06/15/2022 CLINICAL DATA:  Recent CABG. EXAM: PORTABLE CHEST 1 VIEW COMPARISON:  CXR 06/09/22 FINDINGS:  Interval removal of the right-sided central venous catheter sheath. Left-sided central venous catheter is unchanged in positioning likely terminating near the confluence of the brachiocephalic veins. Status post median sternotomy and CABG. Unchanged cardiac and mediastinal contours. Likely trace left pleural effusion. Low lung volumes. No focal airspace opacity. Prominent interstitial opacities are favored to represent bronchovascular crowding in the setting of low lung volumes. No pneumothorax. Visualized upper abdomen is unremarkable. No displaced rib fractures. IMPRESSION: Possible trace left pleural effusion. Electronically Signed   By: Marin Roberts M.D.   On: 06/15/2022 08:04   DG Abd Portable 1V  Result Date: 06/14/2022 CLINICAL DATA:  End-stage chronic kidney disease, peritoneal dialysis, chest pain, burning with urination. EXAM: PORTABLE ABDOMEN - 1 VIEW COMPARISON:  CT abdomen pelvis 09/24/2019. FINDINGS: Bowel gas pattern is unremarkable. No radiopaque calculi. A 4.2 cm vertically oriented tubular radiodense structure is seen to the left of L4 and L5, representing the metallic portion of a dialysis catheter. Small bilateral pleural effusions and bibasilar atelectasis. IMPRESSION: 1. No acute findings in the abdomen. 2. Small bilateral pleural effusions and bibasilar atelectasis. Electronically Signed   By: Lorin Picket M.D.   On: 06/14/2022 08:51    Medications: Infusions:  sodium chloride Stopped (06/11/22 0954)   sodium chloride     dialysis solution 1.5% low-MG/low-CA     dialysis solution 2.5% low-MG/low-CA      Scheduled Medications:  aspirin EC  325 mg Oral Daily   atorvastatin  80 mg Oral Daily   bisacodyl  10 mg Oral Daily   Or   bisacodyl  10 mg Rectal Daily   Chlorhexidine Gluconate Cloth  6 each Topical Daily   darbepoetin (ARANESP) injection - DIALYSIS  150 mcg Subcutaneous Q Fri-1800   docusate sodium  200 mg Oral BID   feeding supplement (NEPRO CARB STEADY)  237 mL  Oral BID BM   gentamicin cream  1 Application Topical Daily   gentamicin cream  1 Application Topical Daily   insulin aspart  0-5 Units Subcutaneous QHS   insulin aspart  0-9 Units Subcutaneous TID WC   insulin detemir  8 Units Subcutaneous Daily   melatonin  3 mg Oral QHS   methocarbamol  500 mg Oral TID   midodrine  15 mg Oral TID WC   oxybutynin  5 mg Oral TID   pantoprazole  40 mg Oral Daily   polyethylene glycol  17 g Oral Daily   polyethylene glycol  17 g Oral Once   QUEtiapine  25 mg Oral QHS   senna  2 tablet Oral Daily   sodium chloride flush  3 mL Intravenous Q12H    have reviewed scheduled and prn medications.  Physical Exam:      General adult male in bed in no acute distress HEENT normocephalic atraumatic extraocular movements intact sclera anicteric Neck supple trachea midline Lungs clear to auscultation bilaterally normal work of breathing at rest on room air Heart S1S2 Abdomen soft nontender distended and obese habitus Extremities 1+ lower extremity edema  Psych normal mood and affect Neuro - alert and oriented x 3 provides hx and follows commands Access PD catheter in place; LUE AVF has bruit no thrill  Claudia Desanctis 06/16/2022,6:23 AM  LOS: 17 days

## 2022-06-16 NOTE — Progress Notes (Signed)
Came to ambulate given tx order. However pt c/o 10/10 pain. Had received pain meds earlier, now RN is treating further. His lunch is in front of him but declining eating. Pt sts he just can't walk currently. Encouraged IS and walking later when pain is controlled.  [place], ACSM-CEP 06/16/2022 2:36 PM

## 2022-06-16 NOTE — Progress Notes (Signed)
NAME:  [name] [name], MRN:  [id number], DOB:  12-03-1950, LOS: 63 ADMISSION DATE:  05/30/2022, CONSULTATION DATE: 06/05/2022 REFERRING MD: Dr. Kipp Brood, CHIEF COMPLAINT: S/p CABG  History of Present Illness:  71 year old male with end-stage renal disease on peritoneal dialysis, diabetes type 2, hypertension, hyperlipidemia, coronary artery disease, OSA and COPD, who was admitted with unstable angina, on work-up noted to have multivessel coronary artery disease underwent CABG x2, postop requiring vasopressor support and remain on full mechanical ventilatory support. Received 2 units of PRBCs in OR PCCM was consulted for help evaluation medical management  Pertinent  Medical History   Past Medical History:  Diagnosis Date   Anemia    Arthritis    Asthma    Bell palsy    CAD (coronary artery disease)    a. 2014 MV: abnl w/ infap ischemia; b. 03/2013 Cath: aneurysmal bleb in the LAD w/ otw nonobs dzs-->Med Rx.   Chronic back pain    Chronic knee pain    a. 09/2015 s/p R TKA.   Chronic pain    Chronic shoulder pain    Chronic sinusitis    COPD (chronic obstructive pulmonary disease) ([place])    Diabetes mellitus without complication ([place])    type II    ESRD on peritoneal dialysis ([place])    on peritoneal dialysis, [facility]   Essential hypertension    GERD (gastroesophageal reflux disease)    Gout    Gout    Hepatomegaly    noted on noncontrast CT 2015   History of hiatal hernia    Hyperlipidemia    Lateral meniscus tear    Obesity    Truncal   Obstructive sleep apnea    does not use cpap    On home oxygen therapy    uses 2l when is going somewhere per patient    PUD (peptic ulcer disease)    remote, reports f/u EGD about 8 years ago unremarkable    Reactive airway disease    related to exposure to chemical during 9/11   Sinusitis    Vitamin D deficiency      Significant Hospital Events: Including procedures, antibiotic start and stop dates in addition to other  pertinent events   11/14 admitted with NSTEMI 11/20 CABG 11/29 pacer turned down to backup rate 35 12/1 intrinsic heart rate better  Interim History / Subjective:  No distress today.  Sitting up in chair.  Eating breakfast.  Objective   Blood pressure (Abnormal) 83/61, pulse 73, temperature 100 F (37.8 C), temperature source Oral, resp. rate (Abnormal) 21, height '5\' 4"'$  (1.626 m), weight 105.5 kg, SpO2 91 %.        Intake/Output Summary (Last 24 hours) at 06/16/2022 0729 Last data filed at 06/16/2022 1245 Gross per 24 hour  Intake 937.6 ml  Output 5210 ml  Net -4272.4 ml   Filed Weights   06/13/22 0500 06/14/22 0500 06/16/22 0330  Weight: 105.8 kg 105.6 kg 105.5 kg    Examination: General 71 year old male patient sitting up in chair he is currently in no acute distress HEENT normocephalic atraumatic no jugular venous distention appreciated Pulmonary: Clear, diminished bases no accessory use Cardiac:: Remains intermittently and heart block, does appear as though his intrinsic rate has improved compared to yesterday however report from last night, with several episodes down into the 30s and 40s Abdomen soft not tender Extremities: Dependent edema pulses palpable Neuro: Awake oriented no focal deficits.  Resolved Hospital Problem list   Cardiogenic shock, resolved  Acute hypoxic respiratory failure due to pulmonary edema, currently on nasal cannula oxygen Acute delirium, improved Hyperkalemia, resolved  Assessment & Plan:   Acute NSTEMI - s/p 2-vessel CABG 11/20 Plan Continue postop care per CVTS Continue aspirin and Plavix   Hypotension  -on high dose midodrine.  Seems to be at least somewhat exacerbated by bradycardia Plan Continue midodrine EP to decide on pacemaker   Mobitz type II heart block Hx HTN, HLD -Continue pacing wires per T CTS - Backup pacer decreased to rate of 30.   Plan Continue telemetry monitoring   Hx OSA/OHS - on nocturnal  CPAP. Plan CPAP at at bedtime  ESRD on PD - Appreciate nephrology's management.  Unsure if he long-term be able to maintain on peritoneal dialysis.  So far he has been having issues with volume removal Plan Peritoneal dialysis as ordered, nephrology to decide on possible transition to IHD  Diabetes type 2, hyperglycemia Plan Cont Levemir 8 units daily Ssi goal 140-180   Acute blood loss anemia, anemia of end-stage renal disease Plan Trend CBC Trigger for xfusion < 7   Deconditioning Plan PT/OT  Penis pain - unclear etiology. No clear wounds/lesions/rash, no discharge. Plan GC and Chlamydia pending GC  Best Practice (right click and "Reselect all SmartList Selections" daily)  Per primary team.    Erick Colace ACNP-BC [place] Pager # [phone] OR # [phone] if no answer

## 2022-06-17 LAB — RENAL FUNCTION PANEL
Albumin: 2.2 g/dL — ABNORMAL LOW (ref 3.5–5.0)
Anion gap: 14 (ref 5–15)
BUN: 40 mg/dL — ABNORMAL HIGH (ref 8–23)
CO2: 26 mmol/L (ref 22–32)
Calcium: 9.6 mg/dL (ref 8.9–10.3)
Chloride: 96 mmol/L — ABNORMAL LOW (ref 98–111)
Creatinine, Ser: 13.72 mg/dL — ABNORMAL HIGH (ref 0.61–1.24)
GFR, Estimated: 3 mL/min — ABNORMAL LOW (ref >60)
Glucose, Bld: 124 mg/dL — ABNORMAL HIGH (ref 70–99)
Phosphorus: 5.8 mg/dL — ABNORMAL HIGH (ref 2.5–4.6)
Potassium: 4.8 mmol/L (ref 3.5–5.1)
Sodium: 136 mmol/L (ref 135–145)

## 2022-06-17 LAB — MAGNESIUM: Magnesium: 2.5 mg/dL — ABNORMAL HIGH (ref 1.7–2.4)

## 2022-06-17 LAB — CBC
HCT: 26.2 % — ABNORMAL LOW (ref 39.0–52.0)
Hemoglobin: 8.3 g/dL — ABNORMAL LOW (ref 13.0–17.0)
MCH: 31.6 pg (ref 26.0–34.0)
MCHC: 31.7 g/dL (ref 30.0–36.0)
MCV: 99.6 fL (ref 80.0–100.0)
Platelets: 215 10*3/uL (ref 150–400)
RBC: 2.63 MIL/uL — ABNORMAL LOW (ref 4.22–5.81)
RDW: 18.1 % — ABNORMAL HIGH (ref 11.5–15.5)
WBC: 6.6 10*3/uL (ref 4.0–10.5)
nRBC: 0.9 % — ABNORMAL HIGH (ref 0.0–0.2)

## 2022-06-17 LAB — GLUCOSE, CAPILLARY
Glucose-Capillary: 136 mg/dL — ABNORMAL HIGH (ref 70–99)
Glucose-Capillary: 173 mg/dL — ABNORMAL HIGH (ref 70–99)
Glucose-Capillary: 137 mg/dL — ABNORMAL HIGH (ref 70–99)
Glucose-Capillary: 141 mg/dL — ABNORMAL HIGH (ref 70–99)

## 2022-06-17 MED ORDER — FUROSEMIDE 10 MG/ML IJ SOLN
80.0000 mg | Freq: Once | INTRAMUSCULAR | Status: AC
  Administered 2022-06-17: 80 mg via INTRAVENOUS
  Filled 2022-06-17: qty 8

## 2022-06-17 MED ORDER — POLYETHYLENE GLYCOL 3350 17 G PO PACK
17.0000 g | PACK | Freq: Once | ORAL | Status: AC
  Administered 2022-06-17: 17 g via ORAL

## 2022-06-17 NOTE — Progress Notes (Signed)
OT Cancellation Note  Patient Details Name: [name] [name] MRN: [id number] DOB: 09-Jan-1951   Cancelled Treatment:    Reason Eval/Treat Not Completed: Patient at procedure or test/ unavailable (PD, OT eval to f/u when able.)  Elliot Cousin 06/17/2022, 10:30 AM

## 2022-06-17 NOTE — Progress Notes (Signed)
Pt arrived to [facility] and had several episodes of HR dropping into 30s. Received verbal order to place pt back on pacemaker at VVI 50.

## 2022-06-17 NOTE — Progress Notes (Signed)
PT Cancellation Note  Patient Details Name: [name] [name] MRN: [id number] DOB: [birth year]   Cancelled Treatment:    Reason Eval/Treat Not Completed: Patient at procedure or test/unavailable (pt receiving PD)  Wyona Almas, PT, DPT Acute Rehabilitation Services Office [place] 06/17/2022, 9:39 AM

## 2022-06-17 NOTE — Progress Notes (Signed)
[address]       [employer] [address]             [phone]                 12 Days Post-Op Procedure(s) (LRB): OFF PUMP CORONARY ARTERY BYPASS GRAFTING (CABG) X 2 BYPASSES USING LEFT INTERNAL MAMMARY ARTERY AND RIGHT LEG GREATER SAPHENOUS VEIN HARVESTED ENDOSCOPICALLY (N/A) TRANSESOPHAGEAL ECHOCARDIOGRAM (TEE) (N/A)   Events: No events _______________________________________________________________ Vitals: BP 100/81   Pulse (!) 58   Temp 98.8 F (37.1 C) (Oral)   Resp 16   Ht '5\' 4"'$  (1.626 m)   Wt 105.5 kg   SpO2 94%   BMI 39.92 kg/m  Filed Weights   06/13/22 0500 06/14/22 0500 06/16/22 0330  Weight: 105.8 kg 105.6 kg 105.5 kg     - Neuro: alert NAD  - Cardiovascular: sinus   Drips: none.      - Pulm: EWOB    ABG    Component Value Date/Time   PHART 7.372 06/07/2022 2136   PCO2ART 45.6 06/07/2022 2136   PO2ART 156 (H) 06/07/2022 2136   HCO3 26.4 06/07/2022 2136   TCO2 28 06/07/2022 2136   ACIDBASEDEF 2.0 06/07/2022 0630   O2SAT 99 06/07/2022 2136    - Abd: NE - Extremity: trace edema  .Intake/Output      12/01 0701 12/02 0700 12/02 0701 12/03 0700   P.O. 577    I.V. (mL/kg) 3 (0)    IV Piggyback     Total Intake(mL/kg) 580 (5.5)    Urine (mL/kg/hr)     Other     Stool     Total Output     Net +580         Urine Occurrence 2 x    Stool Occurrence 2 x       _______________________________________________________________ Labs:    Latest Ref Rng & Units 06/17/2022    4:57 AM 06/15/2022    6:15 AM 06/13/2022    3:23 AM  CBC  WBC 4.0 - 10.5 K/uL 6.6  6.4  7.4   Hemoglobin 13.0 - 17.0 g/dL 8.3  7.8  7.8   Hematocrit 39.0 - 52.0 % 26.2  26.0  26.8   Platelets 150 - 400 K/uL 215  211  178       Latest Ref Rng & Units 06/17/2022    4:58 AM 06/16/2022    6:55 AM 06/15/2022    6:15 AM  CMP  Glucose 70 - 99 mg/dL 124  129  153   BUN 8 - 23 mg/dL 40  32  29   Creatinine 0.61 - 1.24 mg/dL 13.72  12.15  10.65    Sodium 135 - 145 mmol/L 136  136  137   Potassium 3.5 - 5.1 mmol/L 4.8  3.9  3.9   Chloride 98 - 111 mmol/L 96  98  100   CO2 22 - 32 mmol/L '26  24  26   '$ Calcium 8.9 - 10.3 mg/dL 9.6  9.4  9.1     CXR: -  _______________________________________________________________  Assessment and Plan: POD 12 s/p CABG, 2nd AV block, peritoneal dialysis  Neuro: pain controlled CV: will cap wires, no need to PPM Pulm: IS ambulation Renal: dialysis catheter removed.  On peritoneal GI: on diet Heme: stable.  Will start plavix once wires removed ID: [email or web address] Endo: SSI Dispo: awaiting floor bed   [name] [name] 06/17/2022 9:49 AM

## 2022-06-17 NOTE — Progress Notes (Signed)
[name] [name]  Assessment/ Plan: Pt is a 71 y.o. yo male   with past medical history significant for OSA, COPD, type II DM, CAD, ESRD on PD, anemia who was presented with chest pain, and NSTEMI, seen as a consultation for the management of ESRD.   Dialysis Orders from previous admission in 01/2022.: [facility] therapies patient.  CCPD, 5 exchanges 3 liter fill volumes, 10 min fill time, 1 hour 20 minutes dwell, 20 minute drain.  Last fill Icodextran.  EDW 103 kg. Uses combination of 1.5% and 2.5% depending upon BP and weight    # NSTEMI/unstable angina: Cardiac cath on 11/14 with severe in in-stent restenosis in the right coronary artery and proximal LAD disease.  Underwent CABG on 11/20.  Per CT Surgery   # ESRD on PD: CCPD at night, 5 exchanges, 2-2.5 L fill volume, mix of 1.5 and 2.5% solution.  He does have left upper extremity AV fistula but likely not fully functional. Patient was complaining of abdominal distention and difficulty with 2.5 L of fill volume on 11/17.  We lowered fill volume to 2 L and he tolerated well.  Held RRT on 11/20 but then started CRRT on 11/21.  Temporary dialysis catheter placed by CCM.  PD catheter flushed 11/26 am ------------------ - I am concerned that he will need to do HD - await UF trends - we are changing out his PD machine - Continue PD - HD RN to put patient on now.  Intermittently not getting great UF with PD.  Tonight will try 4.25 and 2.5% dextrose again to see if improves UF (the last time we did this he did have more UF).    - He is on miralax and finally got it on 12/1 - miralax daily  - I've asked the ICU RN to call me if he's not on by 9am     # Hypotension: Pressors per primary - now off.  [name] patient on midodrine    # Anemia of ESRD: Iron saturation 27 with high ferritin.  Increased aranesp to 200 mcg every Friday.  Received transfusion with surgery per charting  #CKD-MBD:  defer calcitriol  given rising calcium.  Resumed sevelamer  # heart block - per cardiology and CTS.  EP has elected not to place a pacemaker for now given infection risk and risk/benefits assessment   Disposition - continue inpatient monitoring   -------------------------------  Subjective: The patient was never put on PD overnight because the HD RN was called to [place] - I have contacted leadership within the HD unit.  I was notified of this issue at [place].  EP elected not to place a pacemaker given risks of infection per nursing report.  He has been a little confused overnight.  He had two unmeasured urine voids over 12/1.  Not yet weighed today.   UF trends  12/1 - per HD RN patient was never put on PD overnight  11/30 pm - 236 mL  11/29 pm - 2723 mL (verbally confirmed with RN) 11/28 pm - 218 ml 11/27 pm - 648 ml 11/19 pm - 894 ml 11/17 pm - 1352 mL 11/16 pm - 100 mL  Review of systems:    He denies shortness of breath  Denies chest pain  Denies n/v   Objective Vital signs in last 24 hours: Vitals:   06/16/22 2224 06/16/22 2300 06/17/22 0000 06/17/22 0100  BP:  (!) '88/64 95/64 98/62 '$  Pulse:   Marland Kitchen)  53   Resp: (!) 21 18 (!) 21 (!) 21  Temp:      TempSrc:      SpO2: 100%  97%   Weight:      Height:       Weight change:   Intake/Output Summary (Last 24 hours) at 06/17/2022 0656 Last data filed at 06/16/2022 2129 Gross per 24 hour  Intake 580 ml  Output --  Net 580 ml    Labs: RENAL PANEL Recent Labs    06/08/22 0415 06/08/22 1510 06/09/22 0405 06/09/22 1602 06/10/22 0421 06/10/22 1600 06/11/22 0417 06/11/22 1642 06/12/22 0341 06/13/22 0323 06/14/22 0531 06/15/22 0615 06/16/22 0655 06/17/22 0458  NA 139   < > 140   < > 136 136 136 139 138 136 134* 137 136 136  K 4.5   < > 4.1   < > 4.1 4.0 4.0 4.3 4.0 3.8 3.9 3.9 3.9 4.8  CL 103   < > 102   < > 101 104 102 104 98 100 100 100 98 96*  CO2 24   < > 26   < > '25 26 26 26 23 26 24 26 24 26  '$ GLUCOSE 126*   < > 105*   < > 98  132* 117* 156* 148* 173* 140* 153* 129* 124*  BUN 19   < > 15   < > '12 11 10 12 17 21 '$ 26* 29* 32* 40*  CREATININE 6.08*   < > 4.30*   < > 3.09* 2.99* 2.98* 3.65* 5.23* 7.16* 8.95* 10.65* 12.15* 13.72*  CALCIUM 8.6*   < > 8.5*   < > 8.0* 8.0* 7.9* 8.4* 8.6* 8.6* 8.8* 9.1 9.4 9.6  MG 2.4  --  2.4  --  2.5*  --  2.4  --  2.5* 2.6* 2.4 2.5* 2.5* 2.5*  PHOS 2.8   < > 2.2*   < > 2.4* 2.7 2.4* 2.3* 2.5 3.8 3.9 4.6 4.9* 5.8*  ALBUMIN 2.5*   < > 2.2*   < > 2.2* 2.2* 2.2* 2.4* 2.3* 2.5* 2.2* 2.3* 2.5* 2.2*   < > = values in this interval not displayed.     Liver Function Tests: Recent Labs  Lab 06/15/22 0615 06/16/22 0655 06/17/22 0458  ALBUMIN 2.3* 2.5* 2.2*   No results for input(s): "LIPASE", "AMYLASE" in the last 168 hours. No results for input(s): "AMMONIA" in the last 168 hours. CBC: Recent Labs    06/02/22 0312 06/03/22 0648 06/11/22 0417 06/12/22 0342 06/13/22 0323 06/15/22 0615 06/17/22 0457  HGB 8.5*   < > 8.0* 7.7* 7.8* 7.8* 8.3*  MCV 95.4   < > 97.0 99.6 103.1* 101.6* 99.6  FERRITIN 1,105*  --   --   --   --   --   --   TIBC 293  --   --   --   --   --   --   IRON 79  --   --   --   --   --   --    < > = values in this interval not displayed.    Cardiac Enzymes: No results for input(s): "CKTOTAL", "CKMB", "CKMBINDEX", "TROPONINI" in the last 168 hours. CBG: Recent Labs  Lab 06/15/22 2042 06/16/22 0657 06/16/22 1114 06/16/22 1606 06/16/22 2123  GLUCAP 123* 126* 136* 133* 108*    Iron Studies: No results for input(s): "IRON", "TIBC", "TRANSFERRIN", "FERRITIN" in the last 72 hours. Studies/Results: No results found.  Medications: Infusions:  sodium chloride Stopped (06/11/22 0954)   sodium chloride     sodium chloride     dialysis solution 1.5% low-MG/low-CA     dialysis solution 2.5% low-MG/low-CA      Scheduled Medications:  aspirin EC  325 mg Oral Daily   atorvastatin  80 mg Oral Daily   bisacodyl  10 mg Oral Daily   Or   bisacodyl  10 mg Rectal  Daily   Chlorhexidine Gluconate Cloth  6 each Topical Daily   darbepoetin (ARANESP) injection - DIALYSIS  200 mcg Subcutaneous Q Fri-1800   docusate sodium  200 mg Oral BID   feeding supplement (NEPRO CARB STEADY)  237 mL Oral BID BM   gentamicin cream  1 Application Topical Daily   gentamicin cream  1 Application Topical Daily   insulin aspart  0-5 Units Subcutaneous QHS   insulin aspart  0-9 Units Subcutaneous TID WC   insulin detemir  8 Units Subcutaneous Daily   melatonin  3 mg Oral QHS   methocarbamol  500 mg Oral TID   midodrine  15 mg Oral TID WC   oxybutynin  5 mg Oral TID   pantoprazole  40 mg Oral Daily   polyethylene glycol  17 g Oral Daily   QUEtiapine  25 mg Oral QHS   senna  2 tablet Oral Daily   sevelamer carbonate  800 mg Oral TID WC   sodium chloride flush  3 mL Intravenous Q12H   sodium chloride flush  3 mL Intravenous Q12H    have reviewed scheduled and prn medications.  Physical Exam:       General adult male in chair in no acute distress HEENT normocephalic atraumatic extraocular movements intact sclera anicteric Neck supple trachea midline Lungs clear to auscultation bilaterally normal work of breathing at rest on room air Heart S1S2 Abdomen soft nontender distended and obese habitus Extremities 1-2+ lower extremity edema  Psych normal mood and affect Neuro - alert and oriented x 3 provides hx and follows commands Access PD catheter in place; LUE AVF has bruit no thrill     Claudia Desanctis 06/17/2022,7:23 AM  LOS: 18 days

## 2022-06-17 NOTE — Progress Notes (Signed)
Dialysis still has not shown up to place pt on PD for night. Attempted to call multiple times, but no answer.

## 2022-06-18 LAB — GLUCOSE, CAPILLARY
Glucose-Capillary: 174 mg/dL — ABNORMAL HIGH (ref 70–99)
Glucose-Capillary: 168 mg/dL — ABNORMAL HIGH (ref 70–99)
Glucose-Capillary: 128 mg/dL — ABNORMAL HIGH (ref 70–99)
Glucose-Capillary: 158 mg/dL — ABNORMAL HIGH (ref 70–99)

## 2022-06-18 LAB — RENAL FUNCTION PANEL
Albumin: 2.3 g/dL — ABNORMAL LOW (ref 3.5–5.0)
Anion gap: 13 (ref 5–15)
BUN: 42 mg/dL — ABNORMAL HIGH (ref 8–23)
CO2: 27 mmol/L (ref 22–32)
Calcium: 9.5 mg/dL (ref 8.9–10.3)
Chloride: 98 mmol/L (ref 98–111)
Creatinine, Ser: 14.52 mg/dL — ABNORMAL HIGH (ref 0.61–1.24)
GFR, Estimated: 3 mL/min — ABNORMAL LOW (ref >60)
Glucose, Bld: 122 mg/dL — ABNORMAL HIGH (ref 70–99)
Phosphorus: 6 mg/dL — ABNORMAL HIGH (ref 2.5–4.6)
Potassium: 4.5 mmol/L (ref 3.5–5.1)
Sodium: 138 mmol/L (ref 135–145)

## 2022-06-18 LAB — MAGNESIUM: Magnesium: 2.5 mg/dL — ABNORMAL HIGH (ref 1.7–2.4)

## 2022-06-18 MED ORDER — SORBITOL 70 % SOLN
60.0000 mL | Freq: Once | Status: AC
  Administered 2022-06-18: 60 mL via ORAL
  Filled 2022-06-18: qty 60

## 2022-06-18 MED ORDER — SEVELAMER CARBONATE 800 MG PO TABS
1600.0000 mg | ORAL_TABLET | Freq: Three times a day (TID) | ORAL | Status: DC
  Administered 2022-06-18 – 2022-06-19 (×2): 1600 mg via ORAL
  Filled 2022-06-18 (×3): qty 2

## 2022-06-18 MED ORDER — CAMPHOR-MENTHOL 0.5-0.5 % EX LOTN: TOPICAL_LOTION | CUTANEOUS | Status: DC | PRN

## 2022-06-18 NOTE — Progress Notes (Addendum)
      [address]       [place] [address]             [phone]      13 Days Post-Op Procedure(s) (LRB): OFF PUMP CORONARY ARTERY BYPASS GRAFTING (CABG) X 2 BYPASSES USING LEFT INTERNAL MAMMARY ARTERY AND RIGHT LEG GREATER SAPHENOUS VEIN HARVESTED ENDOSCOPICALLY (N/A) TRANSESOPHAGEAL ECHOCARDIOGRAM (TEE) (N/A) Subjective: Transferred from ICU yesterday. Awake and alert, said he rested well and is comfortable. No new concerns.   Objective: Vital signs in last 24 hours: Temp:  [98 F (36.7 C)-98.8 F (37.1 C)] 98.5 F (36.9 C) (12/03 0812) Pulse Rate:  [54-89] 73 (12/03 0812) Cardiac Rhythm: Heart block (12/02 1926) Resp:  [20-24] 20 (12/03 0454) BP: (83-107)/(46-70) 101/64 (12/03 0812) SpO2:  [87 %-98 %] 98 % (12/03 0812)     Intake/Output from previous day: 12/02 0701 - 12/03 0700 In: 360 [P.O.:360] Out: 30 [Urine:30] Intake/Output this shift: No intake/output data recorded.  General appearance: alert, cooperative, and no distress Neurologic: intact Heart: SR alternating with 2nd degree HB and bradycardia into the high 40's as pre-op.  Lungs: breath sounds clear, normal work of breathing and O2 sats on RA Abdomen: firm, NT Extremities: all warm and well perfused. 1-2+ LE edema.  Wound: The sternotomy is well approximated and dry  Lab Results: Recent Labs    06/17/22 0457  WBC 6.6  HGB 8.3*  HCT 26.2*  PLT 215   BMET:  Recent Labs    06/17/22 0458 06/18/22 0052  NA 136 138  K 4.8 4.5  CL 96* 98  CO2 26 27  GLUCOSE 124* 122*  BUN 40* 42*  CREATININE 13.72* 14.52*  CALCIUM 9.6 9.5    PT/INR: No results for input(s): "LABPROT", "INR" in the last 72 hours. ABG    Component Value Date/Time   PHART 7.372 06/07/2022 2136   HCO3 26.4 06/07/2022 2136   TCO2 28 06/07/2022 2136   ACIDBASEDEF 2.0 06/07/2022 0630   O2SAT 99 06/07/2022 2136   CBG (last 3)  Recent Labs    06/17/22 1525 06/17/22 2108 06/18/22 0604  GLUCAP 137*  141* 174*    Assessment/Plan: S/P Procedure(s) (LRB): OFF PUMP CORONARY ARTERY BYPASS GRAFTING (CABG) X 2 BYPASSES USING LEFT INTERNAL MAMMARY ARTERY AND RIGHT LEG GREATER SAPHENOUS VEIN HARVESTED ENDOSCOPICALLY (N/A) TRANSESOPHAGEAL ECHOCARDIOGRAM (TEE) (N/A)  -POD13 off-pump CABG x 2. Stable VS. Progressing slowly with mobility.   On ASA, atorvastatin, midodrine. No BB due to h/o 2nd degree heart block.  -History of 2nd degree Mobitz type 1 heart block- rhythm stable with a few episodes of pacing for HR 48-50.   EP does not feel PPM will be required.   -ESRD- managed with PD, nephrology following.  -IDDM- Currently on Levemir 8 units daily and SSI with good control.  -GI- tolerating PO's, BM yesterday.  -Disposition- planning for eventual discharge to home with his wife. Would like to monitor rhythm 1 more day before removing the pacer wires. Start Plavix after PW removal. Needs more work on mobility.    LOS: 19 days    Antony Odea, PA-C [phone] 06/18/2022  No complaints of symptomatic bradycardia Pacing wire connected to the pacemaker which is turned off Requested laxative for BM and sorbitol ordered Incisions healing well.  Did not walk in the hall today.  .pvtcos

## 2022-06-18 NOTE — Progress Notes (Signed)
OT Cancellation Note  Patient Details Name: ESKER DEVER MRN: [id number] DOB: 1951/01/14   Cancelled Treatment:    Reason Eval/Treat Not Completed: Other (comment) (pt receiving PD, will follow up for OT evaluation as able.)  Renaye Rakers, OTD, OTR/L [facility] Acute [facility] [phone] - [place] 06/18/2022, 3:27 PM

## 2022-06-18 NOTE — Progress Notes (Signed)
[facility] NEPHROLOGY PROGRESS NOTE  Assessment/ Plan: Pt is a 71 y.o. yo male   with past medical history significant for OSA, COPD, type II DM, CAD, ESRD on PD, anemia who was presented with chest pain, and NSTEMI, seen as a consultation for the management of ESRD.   Dialysis Orders from previous admission in 01/2022.: [facility] therapies patient.  CCPD, 5 exchanges 3 liter fill volumes, 10 min fill time, 1 hour 20 minutes dwell, 20 minute drain.  Last fill Icodextran.  EDW 103 kg. Uses combination of 1.5% and 2.5% depending upon BP and weight    # NSTEMI/unstable angina: Cardiac cath on 11/14 with severe in in-stent restenosis in the right coronary artery and proximal LAD disease.  Underwent CABG on 11/20.  Per CT Surgery   # ESRD on PD: CCPD at night, 5 exchanges, 2-2.5 L fill volume, mix of 1.5 and 2.5% solution.  He does have left upper extremity AV fistula but likely not fully functional. Patient was complaining of abdominal distention and difficulty with 2.5 L of fill volume on 11/17.  We lowered fill volume to 2 L and he tolerated well.  Held RRT on 11/20 but then started CRRT on 11/21.  Temporary dialysis catheter placed by CCM.  PD catheter flushed 11/26 am ------------------ - Unfortunately his PD UF was not ever charted when he ultimately got treatment on 12/2 and was taken off later that night.  I do see the 1903 mL UF myself on his treatment from 12/3 which is and 12/2-12/3 overnight; improvement in UF is encouraging.   - we have changed out his PD machine - Continue PD with all 2.5% dextrose tonight - as above struggling with UF but also with getting treatment and PD documentation recently.  Clearance is has not yet plateaued after changing from CRRT to PD    - miralax daily (he finally did get it)    # Hypotension: Pressors per primary - now off.  Note patient on midodrine    # Anemia of ESRD: Iron saturation 27 with high ferritin.  Increased aranesp to  200 mcg every Friday.  Received transfusion with surgery per charting  #CKD-MBD:  defer calcitriol given rising calcium.  Resumed sevelamer  # heart block - per cardiology and CTS.  EP has elected not to place a pacemaker for now given infection risk and risk/benefits assessment   Disposition - continue inpatient monitoring   -------------------------------  Subjective: He was moved from the ICU to [place].  He had PD during the day yesterday to make up for missing treatment on 12/1 pm-12/2 am and then was put on PD again overnight.  Unfortunately the dialysis UF from PD was never charted for the 12/2 daytime treatment.  He is still connected to PD and 1903 mL is the UF from overnight 12/2-12/3 (he was put on late).    He had 30 ml UOP over 12/2 and 2 unmeasured urine voids over 12/1.  wasn't weighed since 12/1.    UF trends  12/2 pm - 12/3 am - no UF charted yet but on machine it reads as 1903 mL 12/2 - put on 12/2 late am - no UF charted for 12/2 by dialysis RN  12/1 - per HD RN patient was never put on PD overnight  11/30 pm - 236 mL  11/29 pm - 2723 mL (verbally confirmed with RN) 11/28 pm - 218 ml 11/27 pm - 648 ml 11/19 pm - 894 ml  11/17 pm - 1352 mL 11/16 pm - 100 mL  Review of systems:     He denies shortness of breath  Denies chest pain  Denies n/v No cramping  Objective Vital signs in last 24 hours: Vitals:   06/18/22 0025 06/18/22 0454 06/18/22 0812 06/18/22 1123  BP: (!) 95/57 94/60 101/64 105/69  Pulse: 89 (!) 54 73 64  Resp: 20 20    Temp: 98.4 F (36.9 C) 98.4 F (36.9 C) 98.5 F (36.9 C) 98.5 F (36.9 C)  TempSrc: Oral Oral Oral Oral  SpO2: 96% 95% 98% 95%  Weight:      Height:       Weight change:  No intake or output data in the 24 hours ending 06/18/22 1407   Labs: RENAL PANEL Recent Labs    06/09/22 0405 06/09/22 1602 06/10/22 0421 06/10/22 1600 06/11/22 0417 06/11/22 1642 06/12/22 0341 06/13/22 0323 06/14/22 0531 06/15/22 0615  06/16/22 0655 06/17/22 0458 06/18/22 0052  NA 140   < > 136 136 136 139 138 136 134* 137 136 136 138  K 4.1   < > 4.1 4.0 4.0 4.3 4.0 3.8 3.9 3.9 3.9 4.8 4.5  CL 102   < > 101 104 102 104 98 100 100 100 98 96* 98  CO2 26   < > '25 26 26 26 23 26 24 26 24 26 27  '$ GLUCOSE 105*   < > 98 132* 117* 156* 148* 173* 140* 153* 129* 124* 122*  BUN 15   < > '12 11 10 12 17 21 '$ 26* 29* 32* 40* 42*  CREATININE 4.30*   < > 3.09* 2.99* 2.98* 3.65* 5.23* 7.16* 8.95* 10.65* 12.15* 13.72* 14.52*  CALCIUM 8.5*   < > 8.0* 8.0* 7.9* 8.4* 8.6* 8.6* 8.8* 9.1 9.4 9.6 9.5  MG 2.4  --  2.5*  --  2.4  --  2.5* 2.6* 2.4 2.5* 2.5* 2.5* 2.5*  PHOS 2.2*   < > 2.4* 2.7 2.4* 2.3* 2.5 3.8 3.9 4.6 4.9* 5.8* 6.0*  ALBUMIN 2.2*   < > 2.2* 2.2* 2.2* 2.4* 2.3* 2.5* 2.2* 2.3* 2.5* 2.2* 2.3*   < > = values in this interval not displayed.     Liver Function Tests: Recent Labs  Lab 06/16/22 0655 06/17/22 0458 06/18/22 0052  ALBUMIN 2.5* 2.2* 2.3*   No results for input(s): "LIPASE", "AMYLASE" in the last 168 hours. No results for input(s): "AMMONIA" in the last 168 hours. CBC: Recent Labs    06/02/22 0312 06/03/22 0648 06/11/22 0417 06/12/22 0342 06/13/22 0323 06/15/22 0615 06/17/22 0457  HGB 8.5*   < > 8.0* 7.7* 7.8* 7.8* 8.3*  MCV 95.4   < > 97.0 99.6 103.1* 101.6* 99.6  FERRITIN 1,105*  --   --   --   --   --   --   TIBC 293  --   --   --   --   --   --   IRON 79  --   --   --   --   --   --    < > = values in this interval not displayed.    Cardiac Enzymes: No results for input(s): "CKTOTAL", "CKMB", "CKMBINDEX", "TROPONINI" in the last 168 hours. CBG: Recent Labs  Lab 06/17/22 1135 06/17/22 1525 06/17/22 2108 06/18/22 0604 06/18/22 1125  GLUCAP 173* 137* 141* 174* 168*    Iron Studies: No results for input(s): "IRON", "TIBC", "TRANSFERRIN", "FERRITIN" in the last 72 hours.  Studies/Results: No results found.  Medications: Infusions:  sodium chloride Stopped (06/11/22 0954)   sodium chloride      sodium chloride     dialysis solution 1.5% low-MG/low-CA     dialysis solution 2.5% low-MG/low-CA      Scheduled Medications:  aspirin EC  325 mg Oral Daily   atorvastatin  80 mg Oral Daily   bisacodyl  10 mg Oral Daily   Or   bisacodyl  10 mg Rectal Daily   Chlorhexidine Gluconate Cloth  6 each Topical Daily   darbepoetin (ARANESP) injection - DIALYSIS  200 mcg Subcutaneous Q Fri-1800   docusate sodium  200 mg Oral BID   feeding supplement (NEPRO CARB STEADY)  237 mL Oral BID BM   gentamicin cream  1 Application Topical Daily   gentamicin cream  1 Application Topical Daily   insulin aspart  0-5 Units Subcutaneous QHS   insulin aspart  0-9 Units Subcutaneous TID WC   insulin detemir  8 Units Subcutaneous Daily   melatonin  3 mg Oral QHS   methocarbamol  500 mg Oral TID   midodrine  15 mg Oral TID WC   oxybutynin  5 mg Oral TID   pantoprazole  40 mg Oral Daily   polyethylene glycol  17 g Oral Daily   QUEtiapine  25 mg Oral QHS   senna  2 tablet Oral Daily   sevelamer carbonate  800 mg Oral TID WC   sodium chloride flush  3 mL Intravenous Q12H   sodium chloride flush  3 mL Intravenous Q12H    have reviewed scheduled and prn medications.  Physical Exam:        General adult male in chair in no acute distress HEENT normocephalic atraumatic extraocular movements intact sclera anicteric Neck supple trachea midline Lungs clear to auscultation bilaterally normal work of breathing at rest on room air Heart S1S2 Abdomen soft nontender distended and obese habitus Extremities 1+ lower extremity edema  Psych normal mood and affect Neuro - alert and oriented x 3 provides hx and follows commands Access PD catheter in place; LUE AVF has bruit no thrill     Claudia Desanctis 06/18/2022,2:37 PM  LOS: 19 days

## 2022-06-18 NOTE — Progress Notes (Signed)
PT Cancellation Note  Patient Details Name: [name] [name] MRN: [id number] DOB: [birth year]   Cancelled Treatment:    Reason Eval/Treat Not Completed: Patient at procedure or test/unavailable Pt still connected to PD machine, although it is completed. Lauren, RN called dialysis staff, who stated they were unavailable to [facility] patient. Will re-attempt.  Wyona Almas, PT, DPT Acute Rehabilitation Services Office [phone]    Deno Etienne 06/18/2022, 4:06 PM

## 2022-06-18 NOTE — Progress Notes (Signed)
Patient has home CPAP for use and will self place when ready.  

## 2022-06-19 ENCOUNTER — Ambulatory Visit (HOSPITAL_COMMUNITY): Payer: Medicare Other | Admitting: Physical Therapy

## 2022-06-19 ENCOUNTER — Other Ambulatory Visit: Payer: Self-pay | Admitting: Physician Assistant

## 2022-06-19 DIAGNOSIS — D509 Iron deficiency anemia, unspecified: Secondary | ICD-10-CM | POA: Diagnosis not present

## 2022-06-19 DIAGNOSIS — D631 Anemia in chronic kidney disease: Secondary | ICD-10-CM | POA: Diagnosis not present

## 2022-06-19 DIAGNOSIS — I442 Atrioventricular block, complete: Secondary | ICD-10-CM

## 2022-06-19 DIAGNOSIS — Z951 Presence of aortocoronary bypass graft: Secondary | ICD-10-CM | POA: Diagnosis not present

## 2022-06-19 DIAGNOSIS — N250 Renal osteodystrophy: Secondary | ICD-10-CM | POA: Diagnosis not present

## 2022-06-19 DIAGNOSIS — I214 Non-ST elevation (NSTEMI) myocardial infarction: Secondary | ICD-10-CM | POA: Diagnosis not present

## 2022-06-19 DIAGNOSIS — Z992 Dependence on renal dialysis: Secondary | ICD-10-CM | POA: Diagnosis not present

## 2022-06-19 DIAGNOSIS — I441 Atrioventricular block, second degree: Secondary | ICD-10-CM | POA: Diagnosis not present

## 2022-06-19 DIAGNOSIS — N186 End stage renal disease: Secondary | ICD-10-CM | POA: Diagnosis not present

## 2022-06-19 LAB — RENAL FUNCTION PANEL
Albumin: 2.4 g/dL — ABNORMAL LOW (ref 3.5–5.0)
Anion gap: 16 — ABNORMAL HIGH (ref 5–15)
BUN: 39 mg/dL — ABNORMAL HIGH (ref 8–23)
CO2: 25 mmol/L (ref 22–32)
Calcium: 9.5 mg/dL (ref 8.9–10.3)
Chloride: 96 mmol/L — ABNORMAL LOW (ref 98–111)
Creatinine, Ser: 15.16 mg/dL — ABNORMAL HIGH (ref 0.61–1.24)
GFR, Estimated: 3 mL/min — ABNORMAL LOW (ref >60)
Glucose, Bld: 134 mg/dL — ABNORMAL HIGH (ref 70–99)
Phosphorus: 6.2 mg/dL — ABNORMAL HIGH (ref 2.5–4.6)
Potassium: 4 mmol/L (ref 3.5–5.1)
Sodium: 137 mmol/L (ref 135–145)

## 2022-06-19 LAB — MAGNESIUM: Magnesium: 2.6 mg/dL — ABNORMAL HIGH (ref 1.7–2.4)

## 2022-06-19 LAB — GLUCOSE, CAPILLARY
Glucose-Capillary: 134 mg/dL — ABNORMAL HIGH (ref 70–99)
Glucose-Capillary: 124 mg/dL — ABNORMAL HIGH (ref 70–99)

## 2022-06-19 MED ORDER — ATORVASTATIN CALCIUM 80 MG PO TABS: 80.0000 mg | ORAL_TABLET | Freq: Every day | ORAL | 1 refills | Status: AC

## 2022-06-19 MED ORDER — CLOPIDOGREL BISULFATE 75 MG PO TABS: 75.0000 mg | ORAL_TABLET | Freq: Every day | ORAL | 1 refills | Status: DC

## 2022-06-19 MED ORDER — SEVELAMER HCL 800 MG PO TABS: 1600.0000 mg | ORAL_TABLET | Freq: Three times a day (TID) | ORAL | 1 refills | Status: DC

## 2022-06-19 MED ORDER — TRAMADOL HCL 50 MG PO TABS: 50.0000 mg | ORAL_TABLET | Freq: Two times a day (BID) | ORAL | Status: DC | PRN

## 2022-06-19 MED ORDER — OXYCODONE HCL 5 MG PO TABS: 5.0000 mg | ORAL_TABLET | Freq: Four times a day (QID) | ORAL | 0 refills | Status: DC | PRN

## 2022-06-19 MED ORDER — LINAGLIPTIN 5 MG PO TABS: 5.0000 mg | ORAL_TABLET | Freq: Every day | ORAL | Status: DC

## 2022-06-19 MED ORDER — OXYBUTYNIN CHLORIDE 5 MG PO TABS: 5.0000 mg | ORAL_TABLET | Freq: Two times a day (BID) | ORAL | 1 refills | Status: DC

## 2022-06-19 MED ORDER — MIDODRINE HCL 5 MG PO TABS: 15.0000 mg | ORAL_TABLET | Freq: Three times a day (TID) | ORAL | 1 refills | Status: DC

## 2022-06-19 NOTE — Progress Notes (Signed)
Primary Cardiologist:  [place] EP:  [place]  Subjective:  Denies SSCP, palpitations or Dyspnea He has not been OOB Telemetry still with High grade AV block including this am    Objective:  Vitals:   06/18/22 2315 06/19/22 0432 06/19/22 0500 06/19/22 0854  BP: 96/74 91/65  97/72  Pulse: 64 70  62  Resp: 20 20    Temp: 98.1 F (36.7 C) 97.7 F (36.5 C)  98.5 F (36.9 C)  TempSrc: Oral Oral  Oral  SpO2: 90% 92%    Weight:   102.8 kg   Height:        Intake/Output from previous day:  Intake/Output Summary (Last 24 hours) at 06/19/2022 0914 Last data filed at 06/18/2022 1710 Gross per 24 hour  Intake 240 ml  Output 1903 ml  Net -1663 ml    Physical Exam: Obese black male Post sternotomy Peritoneal dialysis catheter Trace edema Fistula LUE antecubital   Lab Results: Basic Metabolic Panel: Recent Labs    06/18/22 0052 06/19/22 0128  NA 138 137  K 4.5 4.0  CL 98 96*  CO2 27 25  GLUCOSE 122* 134*  BUN 42* 39*  CREATININE 14.52* 15.16*  CALCIUM 9.5 9.5  MG 2.5* 2.6*  PHOS 6.0* 6.2*   Liver Function Tests: Recent Labs    06/18/22 0052 06/19/22 0128  ALBUMIN 2.3* 2.4*   No results for input(s): "LIPASE", "AMYLASE" in the last 72 hours. CBC: Recent Labs    06/17/22 0457  WBC 6.6  HGB 8.3*  HCT 26.2*  MCV 99.6  PLT 215     Imaging: No results found.  Cardiac Studies:  ECG: SR loss of precordial R waves wenkebach 06/13/22    Telemetry: CHB, 2:1 AV block Wenkebach  Echo: EF 50-55%  mild MR/AR   Medications:    aspirin EC  325 mg Oral Daily   atorvastatin  80 mg Oral Daily   bisacodyl  10 mg Oral Daily   Or   bisacodyl  10 mg Rectal Daily   Chlorhexidine Gluconate Cloth  6 each Topical Daily   darbepoetin (ARANESP) injection - DIALYSIS  200 mcg Subcutaneous Q Fri-1800   docusate sodium  200 mg Oral BID   feeding supplement (NEPRO CARB STEADY)  237 mL Oral BID BM   gentamicin cream  1 Application Topical Daily   insulin aspart  0-5  Units Subcutaneous QHS   insulin aspart  0-9 Units Subcutaneous TID WC   insulin detemir  8 Units Subcutaneous Daily   melatonin  3 mg Oral QHS   methocarbamol  500 mg Oral TID   midodrine  15 mg Oral TID WC   oxybutynin  5 mg Oral TID   pantoprazole  40 mg Oral Daily   polyethylene glycol  17 g Oral Daily   QUEtiapine  25 mg Oral QHS   senna  2 tablet Oral Daily   sevelamer carbonate  1,600 mg Oral TID WC   sodium chloride flush  3 mL Intravenous Q12H      sodium chloride Stopped (06/11/22 0954)   sodium chloride     sodium chloride     dialysis solution 2.5% low-MG/low-CA      Assessment/Plan:   High grade AV block:  not symptomatic but has not been OOB. Block occurring while awake and not just in PM while asleep. EP seeing Still some concern that he should have PPM this admission. Temp epicardial leads still in Would discuss with EP before pulling and  starting plavix  Will order ECG this am  CAD:  06/05/22 post off pump CABG with LIMA to LAD and SVG to PDA Dr Lowella Dell. EF 50-55% avoid beta blocker with AV block ASA/statin plavix being held until decision on PPM  CRF:  on peritoneal dialysis concern on EP part for higher risk of infection Has fistula in LUE never been used   [employer] 06/19/2022, 9:14 AM

## 2022-06-19 NOTE — Progress Notes (Signed)
      [address]       [place] [address]             [phone]        14 Days Post-Op Procedure(s) (LRB): OFF PUMP CORONARY ARTERY BYPASS GRAFTING (CABG) X 2 BYPASSES USING LEFT INTERNAL MAMMARY ARTERY AND RIGHT LEG GREATER SAPHENOUS VEIN HARVESTED ENDOSCOPICALLY (N/A) TRANSESOPHAGEAL ECHOCARDIOGRAM (TEE) (N/A)  Subjective: Patient just waking up this am.  Objective: Vital signs in last 24 hours: Temp:  [97.7 F (36.5 C)-98.7 F (37.1 C)] 97.7 F (36.5 C) (12/04 0432) Pulse Rate:  [63-73] 70 (12/04 0432) Cardiac Rhythm: Ventricular paced (12/03 2008) Resp:  [20] 20 (12/04 0432) BP: (86-131)/(52-118) 91/65 (12/04 0432) SpO2:  [90 %-98 %] 92 % (12/04 0432) Weight:  [99.8 kg-102.8 kg] 102.8 kg (12/04 0500)  Pre op weight 103.5 ? kg Current Weight  06/19/22 102.8 kg       Intake/Output from previous day: 12/03 0701 - 12/04 0700 In: 290 [P.O.:290] Out: 1903    Physical Exam:  Cardiovascular: Per tele, second degree heart block, RRR Pulmonary: Clear to auscultation bilaterally; Abdomen: Soft, non tender, bowel sounds present. Extremities: Mild bilateral lower extremity edema. Wounds: Clean and dry.  No erythema or signs of infection.  Lab Results: CBC: Recent Labs    06/17/22 0457  WBC 6.6  HGB 8.3*  HCT 26.2*  PLT 215   BMET:  Recent Labs    06/18/22 0052 06/19/22 0128  NA 138 137  K 4.5 4.0  CL 98 96*  CO2 27 25  GLUCOSE 122* 134*  BUN 42* 39*  CREATININE 14.52* 15.16*  CALCIUM 9.5 9.5    PT/INR:  Lab Results  Component Value Date   INR 1.2 06/05/2022   INR 1.0 06/04/2022   INR 0.95 08/06/2018   ABG:  INR: Will add last result for INR, ABG once components are confirmed Will add last 4 CBG results once components are confirmed  Assessment/Plan: CV-Previously, intermittently paced. Had 2:1 and Mobitz 1 heart block previously.  EP determined no need for PPM at this time and back up pacer set at 35 and this is not  be increased. May be able to remove EPW in am. Will start Plavix once EPW removed. On Midodrine 15 mg tid for hypotension Pulmonary-On room air. History of OSA-refused CPAP last night. PA/LAT CXR this am appears stable (cardiomegaly, bibasilar atelectasis). Encourage incentive spirometer ESRD-Creatinine this am up to 15.16. Nephrology arranging for dialysis. He has a PD catheter and LUE AV fistula but likely not functional. PD machine to be changed to see if better UF. Per nephrology, await renal function panel this am and he might need HD. Blood loss anemia/anemia of chronic disease-Last H and H was 8.3 and 26.2. On Aranesp every Friday.  5. DM-CBGs 128/158/134. On Insulin. Pre op HGA1C 5.9. Will restart Tradjenta later in hospital course     Sharalyn Ink [facility] 6:56 AM

## 2022-06-19 NOTE — Progress Notes (Signed)
Pacing wire pulled by MD. Performed EKG per order and placed it in pt's chart. TCTS PA aware of critical result of type 2 HB. Pt asymptomatic. Will continue to monitor the pt Lavenia Atlas, RN

## 2022-06-19 NOTE — Progress Notes (Signed)
[name] [name]  Assessment/ Plan: Pt is a 71 y.o. yo male   with past medical history significant for OSA, COPD, type II DM, CAD, ESRD on PD, anemia who was presented with chest pain, and NSTEMI, seen as a consultation for the management of ESRD.   Dialysis Orders from previous admission in 01/2022.: [facility] therapies patient.  CCPD, 5 exchanges 3 liter fill volumes, 10 min fill time, 1 hour 20 minutes dwell, 20 minute drain.  Last fill Icodextran.  EDW 103 kg. Uses combination of 1.5% and 2.5% depending upon BP and weight    # NSTEMI/unstable angina: Cardiac cath on 11/14 with severe in in-stent restenosis in the right coronary artery and proximal LAD disease.  Underwent CABG on 11/20.  Per CT Surgery ready for d/c today - OK by nephrology    # ESRD on PD: CCPD at night, 5 exchanges, 2-2.5 L fill volume, mix of 1.5 and 2.5% solution.  He does have left upper extremity AV fistula but doubt would be functional. Mildly volume overloaded but can do all 2.5% dialysate at home again tonight.  He has been doing PD for 5y and feels fully comfortable with d/c today. Cont miralax daily for reg BMs - has to go this AM.     # Hypotension: Pressors per primary - now off.  [name] patient on midodrine    # Anemia of ESRD: Iron saturation 27 with high ferritin.  Increased aranesp to 200 mcg every Friday.  Received transfusion with surgery per charting  #CKD-MBD:  defer calcitriol given rising calcium.  Resumed sevelamer  # heart block - per cardiology and CTS.  EP has elected not to place a pacemaker for now given infection risk and risk/benefits assessment   Disposition - continue inpatient monitoring   -------------------------------  Subjective:   Feeling fine today - hopeful for d/c.  Pacer wires were removed this AM and he's in bed for another 42mn but then plans to do BM.  No issues with PD reported but still feels mildly swollen in ankles.  Plans  to use all 2.5% dialysate again tonight.  Hopeful for d/c today.    He denies shortness of breath  Denies chest pain  Denies n/v No cramping  Objective Vital signs in last 24 hours: Vitals:   06/18/22 2315 06/19/22 0432 06/19/22 0500 06/19/22 0854  BP: 96/74 91/65  97/72  Pulse: 64 70  62  Resp: 20 20    Temp: 98.1 F (36.7 C) 97.7 F (36.5 C)  98.5 F (36.9 C)  TempSrc: Oral Oral  Oral  SpO2: 90% 92%  90%  Weight:   102.8 kg   Height:       Weight change:   Intake/Output Summary (Last 24 hours) at 06/19/2022 1029 Last data filed at 06/18/2022 1710 Gross per 24 hour  Intake 240 ml  Output 1903 ml  Net -1663 ml     Labs: RENAL PANEL Recent Labs    06/10/22 0421 06/10/22 1600 06/11/22 0417 06/11/22 1642 06/12/22 0341 06/13/22 0323 06/14/22 0531 06/15/22 0615 06/16/22 0655 06/17/22 0458 06/18/22 0052 06/19/22 0128  NA 136   < > 136 139 138 136 134* 137 136 136 138 137  K 4.1   < > 4.0 4.3 4.0 3.8 3.9 3.9 3.9 4.8 4.5 4.0  CL 101   < > 102 104 98 100 100 100 98 96* 98 96*  CO2 25   < > '26 26 23 '$ 26  $'24 26 24 26 27 25  'f$ GLUCOSE 98   < > 117* 156* 148* 173* 140* 153* 129* 124* 122* 134*  BUN 12   < > '10 12 17 21 '$ 26* 29* 32* 40* 42* 39*  CREATININE 3.09*   < > 2.98* 3.65* 5.23* 7.16* 8.95* 10.65* 12.15* 13.72* 14.52* 15.16*  CALCIUM 8.0*   < > 7.9* 8.4* 8.6* 8.6* 8.8* 9.1 9.4 9.6 9.5 9.5  MG 2.5*  --  2.4  --  2.5* 2.6* 2.4 2.5* 2.5* 2.5* 2.5* 2.6*  PHOS 2.4*   < > 2.4* 2.3* 2.5 3.8 3.9 4.6 4.9* 5.8* 6.0* 6.2*  ALBUMIN 2.2*   < > 2.2* 2.4* 2.3* 2.5* 2.2* 2.3* 2.5* 2.2* 2.3* 2.4*   < > = values in this interval not displayed.      Liver Function Tests: Recent Labs  Lab 06/17/22 0458 06/18/22 0052 06/19/22 0128  ALBUMIN 2.2* 2.3* 2.4*    No results for input(s): "LIPASE", "AMYLASE" in the last 168 hours. No results for input(s): "AMMONIA" in the last 168 hours. CBC: Recent Labs    06/02/22 0312 06/03/22 0648 06/11/22 0417 06/12/22 0342 06/13/22 0323  06/15/22 0615 06/17/22 0457  HGB 8.5*   < > 8.0* 7.7* 7.8* 7.8* 8.3*  MCV 95.4   < > 97.0 99.6 103.1* 101.6* 99.6  FERRITIN 1,105*  --   --   --   --   --   --   TIBC 293  --   --   --   --   --   --   IRON 79  --   --   --   --   --   --    < > = values in this interval not displayed.     Cardiac Enzymes: No results for input(s): "CKTOTAL", "CKMB", "CKMBINDEX", "TROPONINI" in the last 168 hours. CBG: Recent Labs  Lab 06/18/22 0604 06/18/22 1125 06/18/22 1704 06/18/22 2153 06/19/22 0652  GLUCAP 174* 168* 128* 158* 134*     Iron Studies: No results for input(s): "IRON", "TIBC", "TRANSFERRIN", "FERRITIN" in the last 72 hours. Studies/Results: No results found.  Medications: Infusions:  sodium chloride Stopped (06/11/22 0954)   sodium chloride     sodium chloride     dialysis solution 2.5% low-MG/low-CA      Scheduled Medications:  aspirin EC  325 mg Oral Daily   atorvastatin  80 mg Oral Daily   bisacodyl  10 mg Oral Daily   Or   bisacodyl  10 mg Rectal Daily   Chlorhexidine Gluconate Cloth  6 each Topical Daily   darbepoetin (ARANESP) injection - DIALYSIS  200 mcg Subcutaneous Q Fri-1800   docusate sodium  200 mg Oral BID   feeding supplement (NEPRO CARB STEADY)  237 mL Oral BID BM   gentamicin cream  1 Application Topical Daily   insulin aspart  0-5 Units Subcutaneous QHS   insulin aspart  0-9 Units Subcutaneous TID WC   insulin detemir  8 Units Subcutaneous Daily   melatonin  3 mg Oral QHS   methocarbamol  500 mg Oral TID   midodrine  15 mg Oral TID WC   oxybutynin  5 mg Oral TID   pantoprazole  40 mg Oral Daily   polyethylene glycol  17 g Oral Daily   QUEtiapine  25 mg Oral QHS   senna  2 tablet Oral Daily   sevelamer carbonate  1,600 mg Oral TID WC   sodium chloride flush  3 mL Intravenous Q12H    have reviewed scheduled and prn medications.  Physical Exam:        General adult male in chair in no acute distress HEENT normocephalic atraumatic  extraocular movements intact sclera anicteric Neck supple trachea midline Lungs clear to auscultation bilaterally normal work of breathing at rest on room air Heart S1S2 Abdomen soft nontender distended and obese habitus Extremities 1+ lower extremity edema Psych normal mood and affect Neuro - alert and oriented x 3 provides hx and follows commands Access PD catheter in place; LUE AVF has bruit - tortuous and small - wouldn't be functional.    Justin Mend 06/19/2022,10:29 AM  LOS: 20 days

## 2022-06-19 NOTE — Evaluation (Signed)
Occupational Therapy Evaluation Patient Details Name: [name] [name] MRN: [id number] DOB: 1951/04/03 Today's Date: 06/19/2022   History of Present Illness Pt is a 71 y.o. male admitted 05/30/22 with unstable angina. S/p CABGx2 on 11/20. ETT  Pt with Mobitz type II heart block requiring temporary pacer; per EP, do not plan for permanent PPM. PMH includes ESRD (on PD), DM, CAD, COPD, HLD, obesity, gout, arthritis, chronic pain.   Clinical Impression   PTA patient independent and driving. Admitted for above and presents with problem list below, including weakness, sternal precautions and decreased activity tolerance.  Today, he completes ADLs with up to min assist, transfers with supervision and in room mobility with supervision.  He will have good support of spouse at home, who can assist as needed. Encouraged progressive return to PLOF as tolerance increases. Discussed use of 3:1 as shower chair for tolerance and safety.  Pt/spouse voiced understanding, able to verbalize sternal precautions at end of session.  No further OT needs identified and OT will sign off.      Recommendations for follow up therapy are one component of a multi-disciplinary discharge planning process, led by the attending physician.  Recommendations may be updated based on patient status, additional functional criteria and insurance authorization.   Follow Up Recommendations  No OT follow up     Assistance Recommended at Discharge Intermittent Supervision/Assistance  Patient can return home with the following A little help with walking and/or transfers;A little help with bathing/dressing/bathroom;Assistance with cooking/housework;Assist for transportation;Help with stairs or ramp for entrance    Functional Status Assessment  Patient has had a recent decline in their functional status and demonstrates the ability to make significant improvements in function in a reasonable and predictable amount of time.  Equipment  Recommendations  BSC/3in1    Recommendations for Other Services       Precautions / Restrictions Precautions Precautions: Sternal Precaution Booklet Issued: Yes (comment) Precaution Comments: reviewed with pt Restrictions Weight Bearing Restrictions: Yes Other Position/Activity Restrictions: sternal precautions      Mobility Bed Mobility Overal bed mobility: Needs Assistance Bed Mobility: Rolling, Sidelying to Sit, Sit to Sidelying Rolling: Supervision Sidelying to sit: Supervision     Sit to sidelying: Supervision      Transfers Overall transfer level: Needs assistance   Transfers: Sit to/from Stand Sit to Stand: Supervision           General transfer comment: for safety, good adherance to hands on knees      Balance Overall balance assessment: Mild deficits observed, not formally tested                                         ADL either performed or assessed with clinical judgement   ADL Overall ADL's : Needs assistance/impaired     Grooming: Supervision/safety;Standing           Upper Body Dressing : Minimal assistance;Sitting   Lower Body Dressing: Supervision/safety;Sit to/from stand   Toilet Transfer: Supervision/safety;Ambulation           Functional mobility during ADLs: Supervision/safety;Cueing for safety General ADL Comments: reviewed compensatory techniques due to sternal precautions, will have spouse assist as needed.  Educated on use of 3:1 as shower chair due safety and activity tolerance.     Vision   Vision Assessment?: No apparent visual deficits     Perception     Praxis  Pertinent Vitals/Pain Pain Assessment Pain Assessment: Faces Faces Pain Scale: Hurts a little bit Pain Location: chest - incisional Pain Descriptors / Indicators: Discomfort, Sore Pain Intervention(s): Monitored during session, Repositioned     Hand Dominance Right   Extremity/Trunk Assessment Upper Extremity  Assessment Upper Extremity Assessment: Overall WFL for tasks assessed (within sternal precautions)   Lower Extremity Assessment Lower Extremity Assessment: Defer to PT evaluation       Communication Communication Communication: No difficulties   Cognition Arousal/Alertness: Awake/alert Behavior During Therapy: WFL for tasks assessed/performed Overall Cognitive Status: Within Functional Limits for tasks assessed                                       General Comments  spouse present and supportive; SpO2 on RA with good waveform 98%    Exercises     Shoulder Instructions      Home Living Family/patient expects to be discharged to:: Private residence Living Arrangements: Spouse/significant other Available Help at Discharge: Family Type of Home: House Home Access: Stairs to enter Technical brewer of Steps: 1 Entrance Stairs-Rails: Left Home Layout: One level;Laundry or work area in basement     [employer] Shower/Tub: Teacher, early years/pre: Handicapped height     Home Equipment: Conservation officer, nature (2 wheels);Crutches          Prior Functioning/Environment Prior Level of Function : Independent/Modified Independent;Driving             Mobility Comments: independent mobility ADLs Comments: independent ADLs, light IADLs, driving, manages meds        OT Problem List: Decreased knowledge of use of DME or AE;Decreased knowledge of precautions;Decreased activity tolerance      OT Treatment/Interventions:      OT Goals(Current goals can be found in the care plan section) Acute Rehab OT Goals Patient Stated Goal: home OT Goal Formulation: With patient Time For Goal Achievement: 07/03/22 Potential to Achieve Goals: Good  OT Frequency:      Co-evaluation              AM-PAC OT "6 Clicks" Daily Activity     Outcome Measure Help from another person eating meals?: None Help from another person taking care of personal grooming?: A  Little Help from another person toileting, which includes using toliet, bedpan, or urinal?: A Little Help from another person bathing (including washing, rinsing, drying)?: A Little Help from another person to put on and taking off regular upper body clothing?: A Little Help from another person to put on and taking off regular lower body clothing?: A Little 6 Click Score: 19   End of Session Nurse Communication: Mobility status  Activity Tolerance: Patient tolerated treatment well Patient left: with call bell/phone within reach;with family/visitor present;Other (comment) (EOB, PT present)  OT Visit Diagnosis: Other abnormalities of gait and mobility (R26.89);Muscle weakness (generalized) (M62.81)                Time: 6440-3474 OT Time Calculation (min): 27 min Charges:  OT General Charges $OT Visit: 1 Visit OT Evaluation $OT Eval Moderate Complexity: 1 Mod OT Treatments $Self Care/Home Management : 8-22 mins  Jolaine Artist, OT Acute Rehabilitation Services Office [phone]   Delight Stare 06/19/2022, 12:33 PM

## 2022-06-19 NOTE — Progress Notes (Signed)
OT Cancellation Note  Patient Details Name: [name] [name] MRN: [id number] DOB: 1950-10-27   Cancelled Treatment:    Reason Eval/Treat Not Completed: Other (comment)- RN reports MD just pulled pacing wires, bedrest until 11am.  Will follow and see as able.   Jolaine Artist, OT Acute Rehabilitation Services Office [phone]   Delight Stare 06/19/2022, 10:06 AM

## 2022-06-19 NOTE — Evaluation (Signed)
Physical Therapy Evaluation Patient Details Name: [name] [name] MRN: [id number] DOB: [birth year] Today's Date: 06/19/2022  History of Present Illness  Pt is a 71 y.o. male admitted 05/30/22 with unstable angina. S/p CABGx2 on 11/20. ETT  Pt with Mobitz type II heart block requiring temporary pacer; per EP, do not plan for permanent PPM. PMH includes ESRD (on PD), DM, CAD, COPD, HLD, obesity, gout, arthritis, chronic pain.  Clinical Impression   Pt presents with [facility] LE strength, mild dynamic standing balance deficits, decreased knowledge and application of sternal precautions, and decreased activity tolerance. Pt to benefit from acute PT to address deficits. Pt ambulated hallway distance without AD, overall pt is supervision for mobility at this time. Pt states Dr. Abran Duke office is setting up [facility] and HHPT, then will transition to cardiac rehab. PT to progress mobility as tolerated, and will continue to follow acutely.    SpO2 89-99% on RA with very irregular waveform. Pt with no DOE or clinical change when measuring in upper 80s. Pt had a period post-gait where reading was 83%, again pt without clinical change and speaking to PT normally, SPO2 quickly bumped back up to 90s. PT suspects pt is just a difficult read and did have cool-to-touch fingers, PT educated pt and family on monitoring for DOE and using pulse oximeter as needed.       Recommendations for follow up therapy are one component of a multi-disciplinary discharge planning process, led by the attending physician.  Recommendations may be updated based on patient status, additional functional criteria and insurance authorization.  Follow Up Recommendations Follow physician's recommendations for discharge plan and follow up therapies      Assistance Recommended at Discharge Set up Supervision/Assistance  Patient can return home with the following  A little help with walking and/or transfers;A little help with  bathing/dressing/bathroom    Equipment Recommendations None recommended by PT  Recommendations for Other Services       Functional Status Assessment Patient has had a recent decline in their functional status and demonstrates the ability to make significant improvements in function in a reasonable and predictable amount of time.     Precautions / Restrictions Precautions Precautions: Sternal Precaution Booklet Issued: Yes (comment) Precaution Comments: OT administered handout, PT reviewed sternal precautions functionally and pt with good recall from OT session Restrictions Weight Bearing Restrictions: Yes Other Position/Activity Restrictions: sternal precautions      Mobility  Bed Mobility               General bed mobility comments: sitting EOB, reviewed log roll for following sternal precautions    Transfers Overall transfer level: Needs assistance Equipment used: None Transfers: Sit to/from Stand Sit to Stand: Supervision                Ambulation/Gait Ambulation/Gait assistance: Supervision Gait Distance (Feet): 125 Feet Assistive device: None Gait Pattern/deviations: Step-through pattern, Decreased stride length Gait velocity: decr     General Gait Details: for safety, periods of weaving of gait when not paying attention but otherwise anticipate pt is at baseline  Stairs            Wheelchair Mobility    Modified Rankin (Stroke Patients Only)       Balance Overall balance assessment: Mild deficits observed, not formally tested (tolerates horizontal and vertical head turns, directional changes with increased time)  Pertinent Vitals/Pain Pain Assessment Pain Assessment: Faces Faces Pain Scale: No hurt Pain Intervention(s): Monitored during session    Home Living Family/patient expects to be discharged to:: Private residence Living Arrangements: Spouse/significant  other Available Help at Discharge: Family Type of Home: House Home Access: Stairs to enter Entrance Stairs-Rails: Left Entrance Stairs-Number of Steps: 1   Home Layout: One level;Laundry or work area in [place]: Conservation officer, nature (2 wheels);Crutches      Prior Function Prior Level of Function : Independent/Modified Independent;Driving             Mobility Comments: independent mobility ADLs Comments: independent ADLs, light IADLs, driving, manages meds     Hand Dominance   Dominant Hand: Right    Extremity/Trunk Assessment   Upper Extremity Assessment Upper Extremity Assessment: Defer to OT evaluation    Lower Extremity Assessment Lower Extremity Assessment: Overall WFL for tasks assessed    Cervical / Trunk Assessment Cervical / Trunk Assessment: Normal  Communication   Communication: No difficulties  Cognition Arousal/Alertness: Awake/alert Behavior During Therapy: WFL for tasks assessed/performed Overall Cognitive Status: Within Functional Limits for tasks assessed                                          General Comments General comments (skin integrity, edema, etc.): SpO2 89-99% on RA with very irregular waveform. Pt with no DOE or clinical change when measuring in upper 80s. Pt had a period post-gait where reading was 83%, again pt without clinical change and speaking to PT normally, SPO2 quickly bumped back up to 90s    Exercises     Assessment/Plan    PT Assessment Patient needs continued PT services  PT Problem List Decreased strength;Decreased mobility;Decreased activity tolerance;Decreased balance;Pain;Cardiopulmonary status limiting activity;Decreased safety awareness       PT Treatment Interventions DME instruction;Therapeutic activities;Gait training;Therapeutic exercise;Patient/family education;Balance training;Stair training;Neuromuscular re-education;Functional mobility training    PT Goals (Current goals can  be found in the Care Plan section)  Acute Rehab PT Goals Patient Stated Goal: home PT Goal Formulation: With patient Time For Goal Achievement: 07/03/22 Potential to Achieve Goals: Good    Frequency Min 3X/week     Co-evaluation               AM-PAC PT "6 Clicks" Mobility  Outcome Measure Help needed turning from your back to your side while in a flat bed without using bedrails?: None Help needed moving from lying on your back to sitting on the side of a flat bed without using bedrails?: None Help needed moving to and from a bed to a chair (including a wheelchair)?: None Help needed standing up from a chair using your arms (e.g., wheelchair or bedside chair)?: A Little Help needed to walk in hospital room?: A Little Help needed climbing 3-5 steps with a railing? : A Little 6 Click Score: 21    End of Session   Activity Tolerance: Patient tolerated treatment well Patient left: in bed;with call bell/phone within reach;with family/visitor present Nurse Communication: Mobility status PT Visit Diagnosis: Other abnormalities of gait and mobility (R26.89)    Time: 2778-2423 PT Time Calculation (min) (ACUTE ONLY): 22 min   Charges:   PT Evaluation $PT Eval Low Complexity: 1 Low          Ronnell Makarewicz S, PT DPT Acute Rehabilitation Services Pager [phone]  Office [phone]   Swan [place] E  [place] 06/19/2022, 2:42 PM

## 2022-06-19 NOTE — Progress Notes (Addendum)
Pt ambulated with PT recently. Discussed with pt and wife IS, sternal precautions, walking at home and CRPII. Receptive. Will refer to AP CRPII. Set up d/c video. Defer diet to RD at dialysis center.  [place], ACSM-CEP 06/19/2022 2:24 PM

## 2022-06-19 NOTE — Progress Notes (Signed)
Mobility Specialist Progress Note:   06/19/22 1120  Mobility  Activity Ambulated with assistance in room;Ambulated with assistance to bathroom  Level of Assistance Standby assist, set-up cues, supervision of patient - no hands on  Assistive Device None  Distance Ambulated (ft) 20 ft  Activity Response Tolerated well  $Mobility charge 1 Mobility   Pt in bed asking to go to bathroom. No complaints of pain. Left EOB with call bell in reach and all needs met.     Mobility Specialist Please contact via Secure Chat or  Rehab Office at [phone]  

## 2022-06-19 NOTE — Progress Notes (Signed)
Contacted by Theressa Stamps regarding pt's HD needs at d/c. Clinic advised pt for possible d/c today per chart review and clinicals faxed for continuation of care. Clinic aware pt should resume PD at d/c per nephrologist note. Will fax d/c summary to clinic once completed for continuation of care.   Melven Sartorius Renal Navigator [phone]

## 2022-06-19 NOTE — Progress Notes (Signed)
PD tx initation note:   Pre TX VS:   Pre TX weight: 99 KG  PD treatment initiated via aseptic technique. Consent signed and in chart. Patient is alert and oriented. No complaints of pain. No specimen collected. PD exit site clean, dry and intact. Gentamycin and new dressing applied. Bedside RN educated on PD machine and how to contact tech support when PD machine alarms.

## 2022-06-19 NOTE — Plan of Care (Signed)
Discharge instructions discussed with patient and spouse.  Patient instructed on home medications, restrictions, and follow up appointments. Belongings gathered and sent with patient.  Patients medications sent to preferred Pharmacy    Patient discharged via wheelchair by this writer, currently waiting on 3 in 1 BSC.

## 2022-06-19 NOTE — Progress Notes (Addendum)
Electrophysiology Rounding Note  Patient Name: [name] [name] Date of Encounter: 06/19/2022  Primary Cardiologist: Pixie Casino, MD Electrophysiologist: Dr. Curt Bears   Subjective   Feeling ok this am.   Inpatient Medications    Scheduled Meds:  aspirin EC  325 mg Oral Daily   atorvastatin  80 mg Oral Daily   bisacodyl  10 mg Oral Daily   Or   bisacodyl  10 mg Rectal Daily   Chlorhexidine Gluconate Cloth  6 each Topical Daily   darbepoetin (ARANESP) injection - DIALYSIS  200 mcg Subcutaneous Q Fri-1800   docusate sodium  200 mg Oral BID   feeding supplement (NEPRO CARB STEADY)  237 mL Oral BID BM   gentamicin cream  1 Application Topical Daily   gentamicin cream  1 Application Topical Daily   insulin aspart  0-5 Units Subcutaneous QHS   insulin aspart  0-9 Units Subcutaneous TID WC   insulin detemir  8 Units Subcutaneous Daily   melatonin  3 mg Oral QHS   methocarbamol  500 mg Oral TID   midodrine  15 mg Oral TID WC   oxybutynin  5 mg Oral TID   pantoprazole  40 mg Oral Daily   polyethylene glycol  17 g Oral Daily   QUEtiapine  25 mg Oral QHS   senna  2 tablet Oral Daily   sevelamer carbonate  1,600 mg Oral TID WC   sodium chloride flush  3 mL Intravenous Q12H   sodium chloride flush  3 mL Intravenous Q12H   Continuous Infusions:  sodium chloride Stopped (06/11/22 0954)   sodium chloride     sodium chloride     dialysis solution 1.5% low-MG/low-CA     dialysis solution 2.5% low-MG/low-CA     PRN Meds: sodium chloride, sodium chloride, sodium chloride, albuterol, camphor-menthol, heparin sodium (porcine) 3,000 Units in dialysis solution 2.5% low-MG/low-CA 6,000 mL dialysis solution, ondansetron (ZOFRAN) IV, mouth rinse, oxyCODONE, sodium chloride flush, traMADol   Vital Signs    Vitals:   06/18/22 2008 06/18/22 2315 06/19/22 0432 06/19/22 0500  BP: (!) 131/118 96/74 91/65   Pulse: 63 64 70   Resp: '20 20 20   '$ Temp: 98.7 F (37.1 C) 98.1 F (36.7 C) 97.7  F (36.5 C)   TempSrc: Oral Oral Oral   SpO2: 90% 90% 92%   Weight:    102.8 kg  Height:        Intake/Output Summary (Last 24 hours) at 06/19/2022 0741 Last data filed at 06/18/2022 1710 Gross per 24 hour  Intake 290 ml  Output 1903 ml  Net -1613 ml   Filed Weights   06/16/22 0330 06/18/22 1605 06/19/22 0500  Weight: 105.5 kg 99.8 kg 102.8 kg    Physical Exam    GEN- The patient is fatigued appearing, alert and oriented x 3 today.   HEENT- No gross abnormality.  Lungs- Clear to ausculation bilaterally, normal work of breathing Heart-  Somewhat irregular  rate and rhythm, no murmurs, rubs or gallops GI- soft, NT, ND, + BS Extremities- no clubbing or cyanosis. No edema Neuro- No obvious focal abnormality.   Labs    CBC Recent Labs    06/17/22 0457  WBC 6.6  HGB 8.3*  HCT 26.2*  MCV 99.6  PLT 630   Basic Metabolic Panel Recent Labs    06/18/22 0052 06/19/22 0128  NA 138 137  K 4.5 4.0  CL 98 96*  CO2 27 25  GLUCOSE 122* 134*  BUN 42* 39*  CREATININE 14.52* 15.16*  CALCIUM 9.5 9.5  MG 2.5* 2.6*  PHOS 6.0* 6.2*   Liver Function Tests Recent Labs    06/18/22 0052 06/19/22 0128  ALBUMIN 2.3* 2.4*   No results for input(s): "LIPASE", "AMYLASE" in the last 72 hours. Cardiac Enzymes No results for input(s): "CKTOTAL", "CKMB", "CKMBINDEX", "TROPONINI" in the last 72 hours.   Telemetry    NSR 70-80s, periods of 2:1 and Mobitz 1. V pacing at 50 for a portion of the weekend once pacer was turned up (personally reviewed)  Radiology    No results found.  Patient Profile     SUSANO CLECKLER is a 71 y.o. male with a hx of IDDM, HTN, HLD, CAD (prior DES to RCA Jan 2023 > June 2023 >> July 2023)ESRF on PD, OSA,  who is being seen 06/07/2022 for the evaluation of persistent heart block at the request of Dr. Kipp Brood.      Assessment & Plan    Advanced AV block Mobitz one with 2:1 and known nocturnal CHB at his baseline and has been relatively  asymptomatic.  Pacing again turned to VVI 50 over the weekend. Have turned down.  He had brief periods of nocturnal pacing consistent with nocturnal pauses which were true at his baseline and at this time are not an indication for permanent pacing.  Given lack of daytime pacing or symptoms, we do not plan for permanent pacing at this time.  He has multiple indwelling catheters and there is some concern he [facility] need to transition back to HD.  His risk of infection is significantly elevated.   Unless pt has sustained, symptomatic bradycardia during the daytime, please leave pacer turned down.   For questions or updates, please contact [place] Please consult [email or web address] for contact info under Cardiology/STEMI.  Signed, Shirley Friar, PA-C  06/19/2022, 7:41 AM   I have seen and examined this patient with [name].  Agree with above, note added to reflect my findings.  Pacemaker was again increased to 50 bpm.  He has not had any symptomatic bradycardia.  GEN: Well nourished, well developed, in no acute distress  HEENT: normal  Neck: no JVD, carotid bruits, or masses Cardiac: RRR; no murmurs, rubs, or gallops,no edema  Respiratory:  clear to auscultation bilaterally, normal work of breathing GI: soft, nontender, nondistended, + BS MS: no deformity or atrophy  Skin: warm and dry Neuro:  Strength and sensation are intact Psych: euthymic mood, full affect   Mobitz 1 AV block: Patient has not had any episodes of symptomatic bradycardia.  He has been bradycardic at night and very weak during the day, but has not had symptoms.  He has plans for likely hemodialysis.  We Miguel Christiana hold off on pacemaker implant for now unless he develops daytime symptoms.  Kannon Baum M. Alek Borges MD 06/19/2022 12:26 PM

## 2022-06-19 NOTE — Progress Notes (Signed)
Pt's 02 sat dropped to 87% at RA with a "good wave". Put pt on 2 L 02. PA informed.   Lavenia Atlas, RN

## 2022-06-19 NOTE — TOC Transition Note (Signed)
Transition of Care (TOC) - CM/SW Discharge Note Marvetta Gibbons RN, BSN Transitions of Care Unit 4E- RN Case Manager See Treatment Team for direct phone #   Patient Details  Name: CAROLE DEERE MRN: [id number] Date of Birth: [birth year]  Transition [facility]) CM/SW Contact:  Dawayne Patricia, RN Phone Number: 06/19/2022, 2:09 PM   Clinical Narrative:    Pt stable for transition home today, Order placed per OT for DME-3n1- no agency preference- CM has contacted [employer] for DME need and DME to be delivered to room prior to discharge.   Notification received from [facility] that they are following patient with MD office protocol referral prearranged for [facility] [place] needs. Lattie Haw with [facility] sent msg that pt going home today for start of care needs and scheduling. Confirmation of notice received.   Wife to transport home.  No further TOC needs noted.    Final next level of care: [place] Barriers to Discharge: Barriers Resolved   Patient Goals and CMS Choice Patient states their goals for this hospitalization and ongoing recovery are:: to return home      Discharge Placement                 Home w/ [facility]      Discharge Plan and Services In-house Referral: NA Discharge Planning Services: CM Consult            DME Arranged: 3-N-1 DME Agency: [employer] Date DME Agency Contacted: 06/19/22 Time DME Agency Contacted: [id number] Representative spoke with at DME Agency: Ulice Dash HH Arranged: RN, PT, OT [facility] Agency: [place] Date [place]: 06/19/22 Time [place]: [place] Representative spoke with at [place]: [place] ([place]) Interventions     Readmission Risk Interventions    06/19/2022    2:09 PM 06/01/2022   11:44 AM 01/20/2022    3:18 PM  Readmission Risk Prevention Plan  Transportation Screening Complete Complete Complete  PCP or Specialist Appt within 3-5 Days   Complete  HRI or [place]  Complete Complete  Social Work Consult for [place] Planning/Counseling  Complete Complete  Palliative Care Screening  Not Applicable Not Applicable  Medication Review Press photographer) Complete Referral to Pharmacy Complete  PCP or Specialist appointment within 3-5 days of discharge Complete    HRI or [place] Complete    SW Recovery Care/Counseling Consult Complete    [place] Not Applicable

## 2022-06-20 DIAGNOSIS — D631 Anemia in chronic kidney disease: Secondary | ICD-10-CM | POA: Diagnosis not present

## 2022-06-20 DIAGNOSIS — N186 End stage renal disease: Secondary | ICD-10-CM | POA: Diagnosis not present

## 2022-06-20 DIAGNOSIS — Z992 Dependence on renal dialysis: Secondary | ICD-10-CM | POA: Diagnosis not present

## 2022-06-20 DIAGNOSIS — N250 Renal osteodystrophy: Secondary | ICD-10-CM | POA: Diagnosis not present

## 2022-06-20 DIAGNOSIS — D509 Iron deficiency anemia, unspecified: Secondary | ICD-10-CM | POA: Diagnosis not present

## 2022-06-21 DIAGNOSIS — N250 Renal osteodystrophy: Secondary | ICD-10-CM | POA: Diagnosis not present

## 2022-06-21 DIAGNOSIS — D631 Anemia in chronic kidney disease: Secondary | ICD-10-CM | POA: Diagnosis not present

## 2022-06-21 DIAGNOSIS — N186 End stage renal disease: Secondary | ICD-10-CM | POA: Diagnosis not present

## 2022-06-21 DIAGNOSIS — Z992 Dependence on renal dialysis: Secondary | ICD-10-CM | POA: Diagnosis not present

## 2022-06-21 DIAGNOSIS — D509 Iron deficiency anemia, unspecified: Secondary | ICD-10-CM | POA: Diagnosis not present

## 2022-06-22 ENCOUNTER — Ambulatory Visit (INDEPENDENT_AMBULATORY_CARE_PROVIDER_SITE_OTHER): Payer: Self-pay | Admitting: Thoracic Surgery (Cardiothoracic Vascular Surgery)

## 2022-06-22 DIAGNOSIS — Z992 Dependence on renal dialysis: Secondary | ICD-10-CM | POA: Diagnosis not present

## 2022-06-22 DIAGNOSIS — D509 Iron deficiency anemia, unspecified: Secondary | ICD-10-CM | POA: Diagnosis not present

## 2022-06-22 DIAGNOSIS — D631 Anemia in chronic kidney disease: Secondary | ICD-10-CM | POA: Diagnosis not present

## 2022-06-22 DIAGNOSIS — Z951 Presence of aortocoronary bypass graft: Secondary | ICD-10-CM

## 2022-06-22 DIAGNOSIS — N250 Renal osteodystrophy: Secondary | ICD-10-CM | POA: Diagnosis not present

## 2022-06-22 DIAGNOSIS — N186 End stage renal disease: Secondary | ICD-10-CM | POA: Diagnosis not present

## 2022-06-22 NOTE — Progress Notes (Signed)
     [address]       [place] [address]             [phone]       Patient: Home Provider: Office Consent for Telemedicine visit obtained.  Today's visit was completed via a real-time telehealth (see specific modality noted below). The patient/authorized person provided oral consent at the time of the visit to engage in a telemedicine encounter with the present provider at [facility]. The patient/authorized person was informed of the potential benefits, limitations, and risks of telemedicine. The patient/authorized person expressed understanding that the laws that protect confidentiality also apply to telemedicine. The patient/authorized person acknowledged understanding that telemedicine does not provide emergency services and that he or she would need to call 911 or proceed to the nearest hospital for help if such a need arose.   Total time spent in the clinical discussion 10 minutes.  Telehealth Modality: Phone visit (audio only)  I had a telephone visit with  [name] [name] who is s/p CABG.  Overall doing well.   Pain is minimal.  Ambulating well. Vitals have been stable.  [name] [name] will see [place] back in 1 month with a chest x-ray for cardiac rehab clearance.  Marycruz Boehner Bary Leriche

## 2022-06-23 DIAGNOSIS — Z7902 Long term (current) use of antithrombotics/antiplatelets: Secondary | ICD-10-CM | POA: Diagnosis not present

## 2022-06-23 DIAGNOSIS — Z9989 Dependence on other enabling machines and devices: Secondary | ICD-10-CM | POA: Diagnosis not present

## 2022-06-23 DIAGNOSIS — I441 Atrioventricular block, second degree: Secondary | ICD-10-CM | POA: Diagnosis not present

## 2022-06-23 DIAGNOSIS — Z48812 Encounter for surgical aftercare following surgery on the circulatory system: Secondary | ICD-10-CM | POA: Diagnosis not present

## 2022-06-23 DIAGNOSIS — D509 Iron deficiency anemia, unspecified: Secondary | ICD-10-CM | POA: Diagnosis not present

## 2022-06-23 DIAGNOSIS — Z6837 Body mass index (BMI) 37.0-37.9, adult: Secondary | ICD-10-CM | POA: Diagnosis not present

## 2022-06-23 DIAGNOSIS — I509 Heart failure, unspecified: Secondary | ICD-10-CM | POA: Diagnosis not present

## 2022-06-23 DIAGNOSIS — Z992 Dependence on renal dialysis: Secondary | ICD-10-CM | POA: Diagnosis not present

## 2022-06-23 DIAGNOSIS — I214 Non-ST elevation (NSTEMI) myocardial infarction: Secondary | ICD-10-CM | POA: Diagnosis not present

## 2022-06-23 DIAGNOSIS — N250 Renal osteodystrophy: Secondary | ICD-10-CM | POA: Diagnosis not present

## 2022-06-23 DIAGNOSIS — E785 Hyperlipidemia, unspecified: Secondary | ICD-10-CM | POA: Diagnosis not present

## 2022-06-23 DIAGNOSIS — Z951 Presence of aortocoronary bypass graft: Secondary | ICD-10-CM | POA: Diagnosis not present

## 2022-06-23 DIAGNOSIS — K219 Gastro-esophageal reflux disease without esophagitis: Secondary | ICD-10-CM | POA: Diagnosis not present

## 2022-06-23 DIAGNOSIS — Z7982 Long term (current) use of aspirin: Secondary | ICD-10-CM | POA: Diagnosis not present

## 2022-06-23 DIAGNOSIS — G4733 Obstructive sleep apnea (adult) (pediatric): Secondary | ICD-10-CM | POA: Diagnosis not present

## 2022-06-23 DIAGNOSIS — Z87891 Personal history of nicotine dependence: Secondary | ICD-10-CM | POA: Diagnosis not present

## 2022-06-23 DIAGNOSIS — Z794 Long term (current) use of insulin: Secondary | ICD-10-CM | POA: Diagnosis not present

## 2022-06-23 DIAGNOSIS — Z7984 Long term (current) use of oral hypoglycemic drugs: Secondary | ICD-10-CM | POA: Diagnosis not present

## 2022-06-23 DIAGNOSIS — D631 Anemia in chronic kidney disease: Secondary | ICD-10-CM | POA: Diagnosis not present

## 2022-06-23 DIAGNOSIS — E1122 Type 2 diabetes mellitus with diabetic chronic kidney disease: Secondary | ICD-10-CM | POA: Diagnosis not present

## 2022-06-23 DIAGNOSIS — N186 End stage renal disease: Secondary | ICD-10-CM | POA: Diagnosis not present

## 2022-06-23 DIAGNOSIS — E1165 Type 2 diabetes mellitus with hyperglycemia: Secondary | ICD-10-CM | POA: Diagnosis not present

## 2022-06-23 DIAGNOSIS — J45909 Unspecified asthma, uncomplicated: Secondary | ICD-10-CM | POA: Diagnosis not present

## 2022-06-23 DIAGNOSIS — I2510 Atherosclerotic heart disease of native coronary artery without angina pectoris: Secondary | ICD-10-CM | POA: Diagnosis not present

## 2022-06-23 DIAGNOSIS — I132 Hypertensive heart and chronic kidney disease with heart failure and with stage 5 chronic kidney disease, or end stage renal disease: Secondary | ICD-10-CM | POA: Diagnosis not present

## 2022-06-24 DIAGNOSIS — Z992 Dependence on renal dialysis: Secondary | ICD-10-CM | POA: Diagnosis not present

## 2022-06-24 DIAGNOSIS — Z48812 Encounter for surgical aftercare following surgery on the circulatory system: Secondary | ICD-10-CM | POA: Diagnosis not present

## 2022-06-24 DIAGNOSIS — D509 Iron deficiency anemia, unspecified: Secondary | ICD-10-CM | POA: Diagnosis not present

## 2022-06-24 DIAGNOSIS — I132 Hypertensive heart and chronic kidney disease with heart failure and with stage 5 chronic kidney disease, or end stage renal disease: Secondary | ICD-10-CM | POA: Diagnosis not present

## 2022-06-24 DIAGNOSIS — I509 Heart failure, unspecified: Secondary | ICD-10-CM | POA: Diagnosis not present

## 2022-06-24 DIAGNOSIS — D631 Anemia in chronic kidney disease: Secondary | ICD-10-CM | POA: Diagnosis not present

## 2022-06-24 DIAGNOSIS — Z951 Presence of aortocoronary bypass graft: Secondary | ICD-10-CM | POA: Diagnosis not present

## 2022-06-24 DIAGNOSIS — N186 End stage renal disease: Secondary | ICD-10-CM | POA: Diagnosis not present

## 2022-06-24 DIAGNOSIS — I2510 Atherosclerotic heart disease of native coronary artery without angina pectoris: Secondary | ICD-10-CM | POA: Diagnosis not present

## 2022-06-24 DIAGNOSIS — I214 Non-ST elevation (NSTEMI) myocardial infarction: Secondary | ICD-10-CM | POA: Diagnosis not present

## 2022-06-24 DIAGNOSIS — N250 Renal osteodystrophy: Secondary | ICD-10-CM | POA: Diagnosis not present

## 2022-06-25 DIAGNOSIS — D631 Anemia in chronic kidney disease: Secondary | ICD-10-CM | POA: Diagnosis not present

## 2022-06-25 DIAGNOSIS — D509 Iron deficiency anemia, unspecified: Secondary | ICD-10-CM | POA: Diagnosis not present

## 2022-06-25 DIAGNOSIS — Z992 Dependence on renal dialysis: Secondary | ICD-10-CM | POA: Diagnosis not present

## 2022-06-25 DIAGNOSIS — N186 End stage renal disease: Secondary | ICD-10-CM | POA: Diagnosis not present

## 2022-06-25 DIAGNOSIS — N250 Renal osteodystrophy: Secondary | ICD-10-CM | POA: Diagnosis not present

## 2022-06-26 DIAGNOSIS — I509 Heart failure, unspecified: Secondary | ICD-10-CM | POA: Diagnosis not present

## 2022-06-26 DIAGNOSIS — N250 Renal osteodystrophy: Secondary | ICD-10-CM | POA: Diagnosis not present

## 2022-06-26 DIAGNOSIS — I132 Hypertensive heart and chronic kidney disease with heart failure and with stage 5 chronic kidney disease, or end stage renal disease: Secondary | ICD-10-CM | POA: Diagnosis not present

## 2022-06-26 DIAGNOSIS — Z992 Dependence on renal dialysis: Secondary | ICD-10-CM | POA: Diagnosis not present

## 2022-06-26 DIAGNOSIS — Z48812 Encounter for surgical aftercare following surgery on the circulatory system: Secondary | ICD-10-CM | POA: Diagnosis not present

## 2022-06-26 DIAGNOSIS — I2510 Atherosclerotic heart disease of native coronary artery without angina pectoris: Secondary | ICD-10-CM | POA: Diagnosis not present

## 2022-06-26 DIAGNOSIS — Z951 Presence of aortocoronary bypass graft: Secondary | ICD-10-CM | POA: Diagnosis not present

## 2022-06-26 DIAGNOSIS — D631 Anemia in chronic kidney disease: Secondary | ICD-10-CM | POA: Diagnosis not present

## 2022-06-26 DIAGNOSIS — D509 Iron deficiency anemia, unspecified: Secondary | ICD-10-CM | POA: Diagnosis not present

## 2022-06-26 DIAGNOSIS — I214 Non-ST elevation (NSTEMI) myocardial infarction: Secondary | ICD-10-CM | POA: Diagnosis not present

## 2022-06-26 DIAGNOSIS — N186 End stage renal disease: Secondary | ICD-10-CM | POA: Diagnosis not present

## 2022-06-27 DIAGNOSIS — Z48812 Encounter for surgical aftercare following surgery on the circulatory system: Secondary | ICD-10-CM | POA: Diagnosis not present

## 2022-06-27 DIAGNOSIS — D631 Anemia in chronic kidney disease: Secondary | ICD-10-CM | POA: Diagnosis not present

## 2022-06-27 DIAGNOSIS — D509 Iron deficiency anemia, unspecified: Secondary | ICD-10-CM | POA: Diagnosis not present

## 2022-06-27 DIAGNOSIS — I132 Hypertensive heart and chronic kidney disease with heart failure and with stage 5 chronic kidney disease, or end stage renal disease: Secondary | ICD-10-CM | POA: Diagnosis not present

## 2022-06-27 DIAGNOSIS — Z951 Presence of aortocoronary bypass graft: Secondary | ICD-10-CM | POA: Diagnosis not present

## 2022-06-27 DIAGNOSIS — I509 Heart failure, unspecified: Secondary | ICD-10-CM | POA: Diagnosis not present

## 2022-06-27 DIAGNOSIS — N250 Renal osteodystrophy: Secondary | ICD-10-CM | POA: Diagnosis not present

## 2022-06-27 DIAGNOSIS — I2510 Atherosclerotic heart disease of native coronary artery without angina pectoris: Secondary | ICD-10-CM | POA: Diagnosis not present

## 2022-06-27 DIAGNOSIS — N186 End stage renal disease: Secondary | ICD-10-CM | POA: Diagnosis not present

## 2022-06-27 DIAGNOSIS — I214 Non-ST elevation (NSTEMI) myocardial infarction: Secondary | ICD-10-CM | POA: Diagnosis not present

## 2022-06-27 DIAGNOSIS — Z992 Dependence on renal dialysis: Secondary | ICD-10-CM | POA: Diagnosis not present

## 2022-06-27 NOTE — Progress Notes (Unsigned)
Cardiology Office Note:    Date:  06/28/2022   ID:  [name] [name], DOB [birth year], MRN [id number]  PCP:  Carrolyn Meiers, MD [place] Cardiologist: Pixie Casino, MD   Reason for visit: Follow-up CABG  History of Present Illness:    [name] [name] is a 71 y.o. male with a hx of hypertension, hyperlipidemia, CAD, IDDM, history of polysubstance abuse with cocaine, end-stage renal disease on peritoneal dialysis and obstructive sleep apnea.   Hx of advanced heart block thought nocturnal & related to sleep apnea.  He was admitted 05/30/2022 with worsening chest pain with associated SOB, not relieved with SL nitro.  LHC showed severe in-stent RCA stenosis as well as progression of his LAD stenosis.  Pt underwent off pump CABG x 2 utilizing LIMA to LAD and SVG to PDA with Dr. Kipp Brood.  EP consulted for AV block & determined that because of lack of daytime pacing or symptoms, there is no plan for PPM at this time.  He had telemed appt with Dr. Kipp Brood on 12/7 - pt with minimal pain, ambulating and was planned to f/u in 1 month with a CXR for cardiac rehab clearance.  Today, he has some nausea after rushing to get here this morning.  Otherwise he says his anginal pain has improved post CABG.  He currently only has incisional pain.  No shortness of breath walking around the house.  He denies PND, orthopnea, palpitations, cough and lightheadedness.  He has occasional LE edema.  He states he had fluid retention during his hospitalization.  He & his wife state it is due to the fact that dialysis was done differently in the hospital.  With his normal peritoneal dialysis at home, they were able to get him back to his baseline weight.  He denies issues with his medications.  No bleeding issues with aspirin and Plavix.  He is taking midodrine for hypotension.    Past Medical History:  Diagnosis Date   Anemia    Arthritis    Asthma    Bell palsy    CAD (coronary artery disease)     a. 2014 MV: abnl w/ infap ischemia; b. 03/2013 Cath: aneurysmal bleb in the LAD w/ otw nonobs dzs-->Med Rx.   Chronic back pain    Chronic knee pain    a. 09/2015 s/p R TKA.   Chronic pain    Chronic shoulder pain    Chronic sinusitis    COPD (chronic obstructive pulmonary disease) ([place])    Diabetes mellitus without complication ([place])    type II    ESRD on peritoneal dialysis ([place])    on peritoneal dialysis, [facility]   Essential hypertension    GERD (gastroesophageal reflux disease)    Gout    Gout    Hepatomegaly    noted on noncontrast CT 2015   History of hiatal hernia    Hyperlipidemia    Lateral meniscus tear    Obesity    Truncal   Obstructive sleep apnea    does not use cpap    On home oxygen therapy    uses 2l when is going somewhere per patient    PUD (peptic ulcer disease)    remote, reports f/u EGD about 8 years ago unremarkable    Reactive airway disease    related to exposure to chemical during 9/11   Sinusitis    Vitamin D deficiency     Past Surgical History:  Procedure Laterality Date  ASAD LT SHOULDER  12/15/2008   left shoulder   AV FISTULA PLACEMENT Left 08/09/2016   Procedure: BRACHIOCEPHALIC ARTERIOVENOUS (AV) FISTULA CREATION LEFT ARM;  Surgeon: [name], MD;  Location: [place];  Service: Vascular;  Laterality: Left;   CAPD INSERTION N/A 10/07/2018   Procedure: LAPAROSCOPIC PERITONEAL CATHETER PLACEMENT;  Surgeon: Mickeal Skinner, MD;  [facility];  Service: General;  Laterality: N/A;   CATARACT EXTRACTION W/PHACO Left 03/28/2016   Procedure: CATARACT EXTRACTION PHACO AND INTRAOCULAR LENS PLACEMENT LEFT EYE;  Surgeon: [name], MD;  Location: [facility];  Service: Ophthalmology;  Laterality: Left;  CDE: 4.77   CATARACT EXTRACTION W/PHACO Right 04/11/2016   Procedure: CATARACT EXTRACTION PHACO AND INTRAOCULAR LENS PLACEMENT RIGHT EYE; CDE:  4.74;  Surgeon: [name], MD;  Location: [facility];  Service: Ophthalmology;   Laterality: Right;   COLONOSCOPY  10/15/2008   Fields: Rectal polyp obliterated, not retrieved, hemorrhoids, single ascending colon diverticulum near the CV. Next colonoscopy April 2020   COLONOSCOPY N/A 12/25/2014   SLF: 1. Colorectal polyps (2) removed 2. Small internal hemorrhoids 3. the left colon is severely redundant. hyperplastic polyps   CORONARY ARTERY BYPASS GRAFT N/A 06/05/2022   Procedure: OFF PUMP CORONARY ARTERY BYPASS GRAFTING (CABG) X 2 BYPASSES USING LEFT INTERNAL MAMMARY ARTERY AND RIGHT LEG GREATER SAPHENOUS VEIN HARVESTED ENDOSCOPICALLY;  Surgeon: Lajuana Matte, MD;  Location: [place];  Service: Open Heart Surgery;  Laterality: N/A;   CORONARY STENT INTERVENTION N/A 07/25/2021   Procedure: CORONARY STENT INTERVENTION;  Surgeon: Sherren Mocha, MD;  Location: [place] [facility];  Service: Cardiovascular;  Laterality: N/A;   CORONARY STENT INTERVENTION N/A 12/26/2021   Procedure: CORONARY STENT INTERVENTION;  Surgeon: Nelva Bush, MD;  Location: [place] [facility];  Service: Cardiovascular;  Laterality: N/A;   CORONARY STENT INTERVENTION N/A 01/20/2022   Procedure: CORONARY STENT INTERVENTION;  Surgeon: Sherren Mocha, MD;  Location: [place] [facility];  Service: Cardiovascular;  Laterality: N/A;   DOPPLER ECHOCARDIOGRAPHY     ESOPHAGOGASTRODUODENOSCOPY N/A 12/25/2014   SLF: 1. Anemia most likely due to CRI, gastritis, gastric polyps 2. Moderate non-erosive gastriits and mild duodenitis.  3.TWo large gstric polyps removed.    EYE SURGERY  12/22/2010   tear duct probing-[place]   FOREIGN BODY REMOVAL  03/29/2011   Procedure: REMOVAL FOREIGN BODY EXTREMITY;  Surgeon: Arther Abbott, MD;  [facility];  Service: Orthopedics;  Laterality: Right;  Removal Foreign Body Right Thumb   IR FLUORO GUIDE CV LINE RIGHT  08/06/2018   IR US GUIDE VASC ACCESS RIGHT  08/06/2018   KNEE ARTHROSCOPY  10/16/2007   left   KNEE ARTHROSCOPY WITH LATERAL MENISECTOMY Right 10/14/2015    Procedure: LEFT KNEE ARTHROSCOPY WITH PARTIAL LATERAL MENISECTOMY;  Surgeon: Carole Civil, MD;  [facility];  Service: Orthopedics;  Laterality: Right;   LEFT HEART CATH AND CORONARY ANGIOGRAPHY N/A 07/25/2021   Procedure: LEFT HEART CATH AND CORONARY ANGIOGRAPHY;  Surgeon: Sherren Mocha, MD;  Location: [place] [facility];  Service: Cardiovascular;  Laterality: N/A;   LEFT HEART CATH AND CORONARY ANGIOGRAPHY N/A 12/26/2021   Procedure: LEFT HEART CATH AND CORONARY ANGIOGRAPHY;  Surgeon: Nelva Bush, MD;  Location: [place] [facility];  Service: Cardiovascular;  Laterality: N/A;   LEFT HEART CATH AND CORONARY ANGIOGRAPHY N/A 01/20/2022   Procedure: LEFT HEART CATH AND CORONARY ANGIOGRAPHY;  Surgeon: Sherren Mocha, MD;  Location: [place] [facility];  Service: Cardiovascular;  Laterality: N/A;   LEFT HEART  CATHETERIZATION WITH CORONARY ANGIOGRAM N/A 03/28/2013   Procedure: LEFT HEART CATHETERIZATION WITH CORONARY ANGIOGRAM;  Surgeon: Leonie Man, MD;  Location: [facility] C[facility];  Service: Cardiovascular;  Laterality: N/A;   NM MYOVIEW LTD     PENILE PROSTHESIS IMPLANT N/A 08/16/2015   Procedure: PENILE PROTHESIS INFLATABLE, three piece, Excisional biopsy of Penile ulcer, Penile molding;  Surgeon: Carolan Clines, MD;  [facility];  Service: Urology;  Laterality: N/A;   PENILE PROSTHESIS IMPLANT N/A 12/24/2017   Procedure: REMOVAL AND  REPLACEMENT  COLOPLAST PENILE PROSTHESIS;  Surgeon: Lucas Mallow, MD;  [facility];  Service: Urology;  Laterality: N/A;   QUADRICEPS TENDON REPAIR  07/21/2011   Procedure: REPAIR QUADRICEP TENDON;  Surgeon: Arther Abbott, MD;  [facility];  Service: Orthopedics;  Laterality: Right;   RIGHT/LEFT HEART CATH AND CORONARY ANGIOGRAPHY N/A 05/30/2022   Procedure: RIGHT/LEFT HEART CATH AND CORONARY ANGIOGRAPHY;  Surgeon: Jettie Booze, MD;  Location: [place] [facility];  Service: Cardiovascular;  Laterality: N/A;   TEE WITHOUT  CARDIOVERSION N/A 06/05/2022   Procedure: TRANSESOPHAGEAL ECHOCARDIOGRAM (TEE);  Surgeon: Lajuana Matte, MD;  Location: [place];  Service: Open Heart Surgery;  Laterality: N/A;   TOENAIL EXCISION     removed [id number]   UMBILICAL HERNIA REPAIR  07/17/2005   [place]    Current Medications: Current Meds  Medication Sig   albuterol (VENTOLIN HFA) 108 (90 Base) MCG/ACT inhaler Inhale 2 puffs into the lungs every 6 (six) hours as needed for wheezing or shortness of breath.   aspirin EC 81 MG tablet Take 81 mg by mouth daily. Swallow whole.   atorvastatin (LIPITOR) 80 MG tablet Take 1 tablet (80 mg total) by mouth daily.   calcitRIOL (ROCALTROL) 0.25 MCG capsule Take 0.25 mcg by mouth daily.   clopidogrel (PLAVIX) 75 MG tablet Take 1 tablet (75 mg total) by mouth daily. Please take first dose at 9 pm on 12/4   DROPLET PEN NEEDLES 31G X 5 MM MISC USE TO INJECT INSULIN DAILY AS DIRECTED   insulin glargine (LANTUS SOLOSTAR) 100 UNIT/ML Solostar Pen Inject 22 Units into the skin at bedtime.   linagliptin (TRADJENTA) 5 MG TABS tablet Take 1 tablet (5 mg total) by mouth daily.   midodrine (PROAMATINE) 5 MG tablet Take 3 tablets (15 mg total) by mouth 3 (three) times daily with meals.   ONETOUCH ULTRA test strip USE TO CHECK BLOOD SUGAR TWICE DAILY   oxybutynin (DITROPAN) 5 MG tablet Take 1 tablet (5 mg total) by mouth 2 (two) times daily.   oxyCODONE (OXY IR/ROXICODONE) 5 MG immediate release tablet Take 1 tablet (5 mg total) by mouth every 6 (six) hours as needed for severe pain.   pantoprazole (PROTONIX) 40 MG tablet Take 1 tablet (40 mg total) by mouth 2 (two) times daily before a meal.   sevelamer (RENAGEL) 800 MG tablet Take 2 tablets (1,600 mg total) by mouth 3 (three) times daily with meals.   Vitamin D, Ergocalciferol, (DRISDOL) 1.25 MG (50000 UNIT) CAPS capsule Take 50,000 Units by mouth every 7 (seven) days. Monday     Allergies:   Opana [oxymorphone hcl] and Tramadol   Social  History   Socioeconomic History   Marital status: Married    Spouse name: Not on file   Number of children: 2   Years of education: 12th grade   Highest education level: Not on file  Occupational History   Occupation: disabled   Occupation:  Employer: UNEMPLOYED  Tobacco Use   Smoking status: Former    Packs/day: 1.00    Years: 25.00    Total pack years: 25.00    Types: Cigarettes    Quit date: 03/27/2010    Years since quitting: 12.2   Smokeless tobacco: Never   Tobacco comments:    Quit x 7 years  Vaping Use   Vaping Use: Never used  Substance and Sexual Activity   Alcohol use: Not Currently    Comment: occasionally   Drug use: Yes    Types: Marijuana    Comment: cocaine- last time used- 11/24/2017 , marijuana-    Sexual activity: Yes  Other Topics Concern   Not on file  Social History Narrative   He quit smoking in 2010. 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 He developed pulmonary problems, became disabled because of lower airway disease in 2009.       WATCHES BASKETBALL. HIS TEAM IS [place].   Social Determinants of Health   Financial Resource Strain: Low Risk  (12/29/2021)   Overall Financial Resource Strain (CARDIA)    Difficulty of Paying Living Expenses: Not hard at all  Food Insecurity: No Food Insecurity (06/07/2022)   Hunger Vital Sign    Worried About Running Out of Food in the Last Year: Never true    Ran Out of Food in the Last Year: Never true  Transportation Needs: No Transportation Needs (06/07/2022)   PRAPARE - Hydrologist (Medical): No    Lack of Transportation (Non-Medical): No  Physical Activity: Sufficiently Active (12/29/2021)   Exercise Vital Sign    Days of Exercise per Week: 3 days    Minutes of Exercise per Session: 60 min  Stress: No Stress Concern Present (12/29/2021)   [place]    Feeling of  Stress : Not at all  Social Connections: [place] (12/29/2021)   Social Connection and Isolation Panel [NHANES]    Frequency of Communication with Friends and Family: More than three times a week    Frequency of Social Gatherings with Friends and Family: More than three times a week    Attends Religious Services: More than 4 times per year    Active Member of [employer] or Organizations: Yes    Attends Music therapist: More than 4 times per year    Marital Status: Married     Family History: The patient's family history includes Arthritis in an other family member; Asthma in an other family member; Cancer in his mother; Diabetes in his father, mother, and sister; Hypertension in his father, mother, and sister; Lung disease in an other family member. There is no history of Anesthesia problems, Hypotension, Malignant hyperthermia, Pseudochol deficiency, or Colon cancer.  ROS:   Please see the history of present illness.     EKGs/Labs/Other Studies Reviewed:    Recent Labs: 07/29/2021: B Natriuretic Peptide 154.0 05/30/2022: TSH 2.033 06/03/2022: ALT 14 06/17/2022: Hemoglobin 8.3; Platelets 215 06/19/2022: BUN 39; Creatinine, Ser 15.16; Magnesium 2.6; Potassium 4.0; Sodium 137   Recent Lipid Panel Lab Results  Component Value Date/Time   CHOL 88 06/03/2022 06:48 AM   CHOL 104 06/10/2021 01:37 PM   TRIG 98 06/03/2022 06:48 AM   HDL 32 (L) 06/03/2022 06:48 AM   HDL 30 (L) 06/10/2021 01:37 PM   LDLCALC 36 06/03/2022 06:48 AM   LDLCALC 47 11/23/2021 01:30 PM  Physical Exam:    VS:  BP 96/61   Pulse (!) 46   Ht '4\' 11"'$  (1.499 m)   Wt 222 lb (100.7 kg)   SpO2 96%   BMI 44.84 kg/m    No data found.       Wt Readings from Last 3 Encounters:  06/28/22 222 lb (100.7 kg)  06/19/22 226 lb 10.1 oz (102.8 kg)  05/03/22 220 lb 9.6 oz (100.1 kg)     GEN:  Well nourished, well developed in no acute distress, in wheelchair HEENT: Normal NECK: No JVD; No carotid  bruits CARDIAC: RRR, no murmurs, rubs, gallops RESPIRATORY:  Clear to auscultation without rales, wheezing or rhonchi  ABDOMEN: Soft, non-tender, non-distended MUSCULOSKELETAL: No edema SKIN: Warm and dry; chest incision clean and dry, well-healed NEUROLOGIC:  Alert and oriented PSYCHIATRIC:  Normal affect     ASSESSMENT AND PLAN   Coronary artery disease without angina  -DES x2 to mid RCA 12/2021 -LHC 01/20/22 showed very early in-stent restenosis of the mid RCA treated with DES. -CABG x 2 utilizing LIMA to LAD and SVG to PDA with Dr. Kipp Brood 05/2022 -On exam -- pt euvolemic, lungs clear, sternal incision well healed. -Continue ASA & Plavix x 1 year for NSTEMI.   -No BB given hypotension/AV block.  No ACEI/ARB given hypotension. -Continue Lipitor 80 mg daily.  Hx of advanced heart block -Evaluated by EP 02/2022 & 05/2022 - [place] one with 2:1 and known nocturnal CHB at his baseline.  "Given lack of daytime pacing or symptoms, we do not plan for permanent pacing at this time."  -Not symptomatic, likely 2/2 OSA, continue to monitor  -Recommend CPAP use -asked to contact pulmonology for CPAP adjustments.  Hypotension -BP stable without lightheadedness -Continue midodrine.  Hyperlipidemia with goal LDL <70 -LDL 36 in November 2023.  Continue Lipitor.  Disposition - Follow-up in 3 months.  Patient is clear to start cardiac rehab from our standpoint, awaiting clearance from CT surgery (appt 1/9).   Medication Adjustments/Labs and Tests Ordered: Current medicines are reviewed at length with the patient today.  Concerns regarding medicines are outlined above.  No orders of the defined types were placed in this encounter.  No orders of the defined types were placed in this encounter.   Patient Instructions  Medication Instructions:  Your physician recommends that you continue on your current medications as directed. Please refer to the Current Medication list given to you  today.  *If you need a refill on your cardiac medications before your next appointment, please call your pharmacy*   Follow-Up: At [facility], you and your health needs are our priority.  As part of our continuing mission to provide you with exceptional heart care, we have created designated Provider Care Teams.  These Care Teams include your primary Cardiologist (physician) and Advanced Practice Providers (APPs -  Physician Assistants and Nurse Practitioners) who all work together to provide you with the care you need, when you need it.  We recommend signing up for the patient portal called "MyChart".  Sign up information is provided on this After Visit Summary.  MyChart is used to connect with patients for Virtual Visits (Telemedicine).  Patients are able to view lab/test results, encounter notes, upcoming appointments, etc.  Non-urgent messages can be sent to your provider as well.   To learn more about what you can do with MyChart, go to [email or web address].    Your next appointment:   3 month(s)  The format for your  next appointment:   In Person  Provider:   Pixie Casino, MD     Other Instructions Cleared to start Cardiac Rehab.  Please reach out to Dr. Bari Mantis office to discuss CPAP adjustments.   Signed, Warren Lacy, PA-C  06/28/2022 10:48 AM    [facility]

## 2022-06-28 ENCOUNTER — Ambulatory Visit: Payer: Medicare Other | Attending: Physician Assistant | Admitting: Physician Assistant

## 2022-06-28 ENCOUNTER — Encounter: Payer: Self-pay | Admitting: Physician Assistant

## 2022-06-28 VITALS — BP 96/61 | HR 46 | Ht 59.0 in | Wt 222.0 lb

## 2022-06-28 DIAGNOSIS — I441 Atrioventricular block, second degree: Secondary | ICD-10-CM | POA: Diagnosis not present

## 2022-06-28 DIAGNOSIS — I25118 Atherosclerotic heart disease of native coronary artery with other forms of angina pectoris: Secondary | ICD-10-CM | POA: Insufficient documentation

## 2022-06-28 DIAGNOSIS — Z992 Dependence on renal dialysis: Secondary | ICD-10-CM | POA: Diagnosis not present

## 2022-06-28 DIAGNOSIS — E785 Hyperlipidemia, unspecified: Secondary | ICD-10-CM | POA: Diagnosis not present

## 2022-06-28 DIAGNOSIS — N186 End stage renal disease: Secondary | ICD-10-CM | POA: Diagnosis not present

## 2022-06-28 DIAGNOSIS — N250 Renal osteodystrophy: Secondary | ICD-10-CM | POA: Diagnosis not present

## 2022-06-28 DIAGNOSIS — D509 Iron deficiency anemia, unspecified: Secondary | ICD-10-CM | POA: Diagnosis not present

## 2022-06-28 DIAGNOSIS — I10 Essential (primary) hypertension: Secondary | ICD-10-CM

## 2022-06-28 DIAGNOSIS — D631 Anemia in chronic kidney disease: Secondary | ICD-10-CM | POA: Diagnosis not present

## 2022-06-28 NOTE — Patient Instructions (Addendum)
Medication Instructions:  Your physician recommends that you continue on your current medications as directed. Please refer to the Current Medication list given to you today.  *If you need a refill on your cardiac medications before your next appointment, please call your pharmacy*   Follow-Up: At [facility], you and your health needs are our priority.  As part of our continuing mission to provide you with exceptional heart care, we have created designated Provider Care Teams.  These Care Teams include your primary Cardiologist (physician) and Advanced Practice Providers (APPs -  Physician Assistants and Nurse Practitioners) who all work together to provide you with the care you need, when you need it.  We recommend signing up for the patient portal called "MyChart".  Sign up information is provided on this After Visit Summary.  MyChart is used to connect with patients for Virtual Visits (Telemedicine).  Patients are able to view lab/test results, encounter notes, upcoming appointments, etc.  Non-urgent messages can be sent to your provider as well.   To learn more about what you can do with MyChart, go to [email or web address].    Your next appointment:   3 month(s)  The format for your next appointment:   In Person  Provider:   Pixie Casino, MD     Other Instructions Cleared to start Cardiac Rehab.  Please reach out to Dr. Bari Mantis office to discuss CPAP adjustments.

## 2022-06-29 DIAGNOSIS — I2510 Atherosclerotic heart disease of native coronary artery without angina pectoris: Secondary | ICD-10-CM | POA: Diagnosis not present

## 2022-06-29 DIAGNOSIS — Z992 Dependence on renal dialysis: Secondary | ICD-10-CM | POA: Diagnosis not present

## 2022-06-29 DIAGNOSIS — Z48812 Encounter for surgical aftercare following surgery on the circulatory system: Secondary | ICD-10-CM | POA: Diagnosis not present

## 2022-06-29 DIAGNOSIS — N250 Renal osteodystrophy: Secondary | ICD-10-CM | POA: Diagnosis not present

## 2022-06-29 DIAGNOSIS — I441 Atrioventricular block, second degree: Secondary | ICD-10-CM | POA: Diagnosis not present

## 2022-06-29 DIAGNOSIS — I509 Heart failure, unspecified: Secondary | ICD-10-CM | POA: Diagnosis not present

## 2022-06-29 DIAGNOSIS — D631 Anemia in chronic kidney disease: Secondary | ICD-10-CM | POA: Diagnosis not present

## 2022-06-29 DIAGNOSIS — I132 Hypertensive heart and chronic kidney disease with heart failure and with stage 5 chronic kidney disease, or end stage renal disease: Secondary | ICD-10-CM | POA: Diagnosis not present

## 2022-06-29 DIAGNOSIS — Z951 Presence of aortocoronary bypass graft: Secondary | ICD-10-CM | POA: Diagnosis not present

## 2022-06-29 DIAGNOSIS — E1165 Type 2 diabetes mellitus with hyperglycemia: Secondary | ICD-10-CM | POA: Diagnosis not present

## 2022-06-29 DIAGNOSIS — N186 End stage renal disease: Secondary | ICD-10-CM | POA: Diagnosis not present

## 2022-06-29 DIAGNOSIS — I214 Non-ST elevation (NSTEMI) myocardial infarction: Secondary | ICD-10-CM | POA: Diagnosis not present

## 2022-06-29 DIAGNOSIS — D509 Iron deficiency anemia, unspecified: Secondary | ICD-10-CM | POA: Diagnosis not present

## 2022-06-29 DIAGNOSIS — I10 Essential (primary) hypertension: Secondary | ICD-10-CM | POA: Diagnosis not present

## 2022-06-29 DIAGNOSIS — I25810 Atherosclerosis of coronary artery bypass graft(s) without angina pectoris: Secondary | ICD-10-CM | POA: Diagnosis not present

## 2022-06-30 DIAGNOSIS — D509 Iron deficiency anemia, unspecified: Secondary | ICD-10-CM | POA: Diagnosis not present

## 2022-06-30 DIAGNOSIS — Z992 Dependence on renal dialysis: Secondary | ICD-10-CM | POA: Diagnosis not present

## 2022-06-30 DIAGNOSIS — N186 End stage renal disease: Secondary | ICD-10-CM | POA: Diagnosis not present

## 2022-06-30 DIAGNOSIS — N250 Renal osteodystrophy: Secondary | ICD-10-CM | POA: Diagnosis not present

## 2022-06-30 DIAGNOSIS — D631 Anemia in chronic kidney disease: Secondary | ICD-10-CM | POA: Diagnosis not present

## 2022-07-01 DIAGNOSIS — N186 End stage renal disease: Secondary | ICD-10-CM | POA: Diagnosis not present

## 2022-07-01 DIAGNOSIS — N250 Renal osteodystrophy: Secondary | ICD-10-CM | POA: Diagnosis not present

## 2022-07-01 DIAGNOSIS — D509 Iron deficiency anemia, unspecified: Secondary | ICD-10-CM | POA: Diagnosis not present

## 2022-07-01 DIAGNOSIS — D631 Anemia in chronic kidney disease: Secondary | ICD-10-CM | POA: Diagnosis not present

## 2022-07-01 DIAGNOSIS — Z992 Dependence on renal dialysis: Secondary | ICD-10-CM | POA: Diagnosis not present

## 2022-07-02 ENCOUNTER — Other Ambulatory Visit: Payer: Self-pay | Admitting: Gastroenterology

## 2022-07-02 DIAGNOSIS — D509 Iron deficiency anemia, unspecified: Secondary | ICD-10-CM | POA: Diagnosis not present

## 2022-07-02 DIAGNOSIS — D631 Anemia in chronic kidney disease: Secondary | ICD-10-CM | POA: Diagnosis not present

## 2022-07-02 DIAGNOSIS — Z992 Dependence on renal dialysis: Secondary | ICD-10-CM | POA: Diagnosis not present

## 2022-07-02 DIAGNOSIS — N250 Renal osteodystrophy: Secondary | ICD-10-CM | POA: Diagnosis not present

## 2022-07-02 DIAGNOSIS — N186 End stage renal disease: Secondary | ICD-10-CM | POA: Diagnosis not present

## 2022-07-03 DIAGNOSIS — Z951 Presence of aortocoronary bypass graft: Secondary | ICD-10-CM | POA: Diagnosis not present

## 2022-07-03 DIAGNOSIS — D509 Iron deficiency anemia, unspecified: Secondary | ICD-10-CM | POA: Diagnosis not present

## 2022-07-03 DIAGNOSIS — N250 Renal osteodystrophy: Secondary | ICD-10-CM | POA: Diagnosis not present

## 2022-07-03 DIAGNOSIS — I132 Hypertensive heart and chronic kidney disease with heart failure and with stage 5 chronic kidney disease, or end stage renal disease: Secondary | ICD-10-CM | POA: Diagnosis not present

## 2022-07-03 DIAGNOSIS — Z992 Dependence on renal dialysis: Secondary | ICD-10-CM | POA: Diagnosis not present

## 2022-07-03 DIAGNOSIS — D631 Anemia in chronic kidney disease: Secondary | ICD-10-CM | POA: Diagnosis not present

## 2022-07-03 DIAGNOSIS — I214 Non-ST elevation (NSTEMI) myocardial infarction: Secondary | ICD-10-CM | POA: Diagnosis not present

## 2022-07-03 DIAGNOSIS — I509 Heart failure, unspecified: Secondary | ICD-10-CM | POA: Diagnosis not present

## 2022-07-03 DIAGNOSIS — N186 End stage renal disease: Secondary | ICD-10-CM | POA: Diagnosis not present

## 2022-07-03 DIAGNOSIS — I2510 Atherosclerotic heart disease of native coronary artery without angina pectoris: Secondary | ICD-10-CM | POA: Diagnosis not present

## 2022-07-03 DIAGNOSIS — Z48812 Encounter for surgical aftercare following surgery on the circulatory system: Secondary | ICD-10-CM | POA: Diagnosis not present

## 2022-07-03 NOTE — Progress Notes (Unsigned)
GI Office Note    Referring Provider: Carrolyn Meiers* Primary Care Physician:  Carrolyn Meiers, MD Primary Gastroenterologist: Elon Alas. Abbey Chatters, DO   Date:  07/04/2022  ID:  [name] [name], DOB 1951-07-10, MRN [id number]   Chief Complaint   Chief Complaint  Patient presents with   Gastroesophageal Reflux    Food seems to be sitting in chest area and burping a lot.     History of Present Illness  [name] [name] is a 71 y.o. male with a history of chronic GERD, hemorrhoids, anemia, CAD (recent open heart surgery in November 2023), STEMI s/p DES to RCA in January 2023 (on DAPT), chronic pain, COPD, type 2 diabetes, ESRD on peritoneal dialysis, HTN, peptic ulcer disease, OSA, vitamin D deficiency and gout presenting today with complaint of food sitting in the top of his chest, nausea but unable to vomit, and inability to belch.  Colonoscopy 2016 with hyperplastic polyps.  Advised colonoscopy in 10 years with overtube/Phenergan preop.  EGD 12/25/2014: -Normal esophagus -Nonerosive gastritis s/p biopsy -2 sessile polyps in the stomach, removed with snare with incomplete resection.  Clips placed, sites tattooed. -Mild duodenitis -Does not follow high-fiber/diabetic diet, monitor weig surgery ht loss  Last office visit in April 2023.  Patient denied any rectal bleeding, only occasional itching when having bowel movement usually resolve and he does have d with using a cream for cleansing with soap and water.  Denies any prolapsing tissue, straining, constipation, or abdominal pain.  GERD controlled on PPI.  Advised to continue PPI twice daily and supportive measures for hemorrhoids.  Return in 1 year.  Chest x-ray 05/19/2022: -S/p median sternotomy and CABG -Likely trace left pleural effusion  Patient hospitalized 05/30/2022 through 12//23.  HPI noted patient had recurrent anginal symptoms in June after having drug-eluting stent placed in January of this year.  In June  found to have stent stenosis and was treated with another drug-eluting stent and continued on aspirin Brilinta therapy.  Had ongoing intermittent angina prior to admission.  Had elevated troponins in the ED.  He underwent left heart cath which demonstrated severe in-stent RCA stenosis and progression of LAD stenosis.  He underwent CABG x 2 while inpatient.  He had some mild complications including inability to extubate immediately postop.  Was placed on insulin drip at 1 point and required CRRT.  He was found to be in a second-degree Mobitz type I heart block and bradycardia while inpatient and needed pressors and pacing.  No recommendation for permanent pacemaker on discharge.  Most recent hemoglobin 8.3 on 06/17/2022.  Seen by CT surgery 06/22/2022 via telehealth.  Advised to follow-up in the office in 1 month for chest x-ray and to discuss cardiac rehab.  Seen by cardiology 06/28/2022.  Advised to continue DAPT for 1 year given NSTEMI.  Advised to not use beta-blocker, ACE/ARB given hypotension and AV block.  Recommended CPAP for OSA.  Clearance given to start cardiac rehab.  Today: Keeps feeling like acid may be sitting in the upper part of his chest or mid throat area. No issue with eating or swallowing. Does have some early satiety and sometimes feels like he needs to vomit but doesn't. Has only been happening since ihe came home. Taking pantoprazole 40 mg twice daily. Denies melena or BRBPR. Does have some occasional intermittent upper abdominal pain. Constant feeling in his throat. Is getting his energy back , doing mild amount of activity. On a daily basis. No alcohol or tobacco use.  Has been on pantoprazole for a long time. Does have sensation like he needs to burp.   Having about 2-3 BM per day. Was constipated but not now.    Current Outpatient Medications  Medication Sig Dispense Refill   albuterol (VENTOLIN HFA) 108 (90 Base) MCG/ACT inhaler Inhale 2 puffs into the lungs every 6 (six)  hours as needed for wheezing or shortness of breath. 18 g 11   aspirin EC 81 MG tablet Take 81 mg by mouth daily. Swallow whole.     atorvastatin (LIPITOR) 80 MG tablet Take 1 tablet (80 mg total) by mouth daily. 30 tablet 1   calcitRIOL (ROCALTROL) 0.25 MCG capsule Take 0.25 mcg by mouth daily.     clopidogrel (PLAVIX) 75 MG tablet Take 1 tablet (75 mg total) by mouth daily. Please take first dose at 9 pm on 12/4 30 tablet 1   DROPLET PEN NEEDLES 31G X 5 MM MISC USE TO INJECT INSULIN DAILY AS DIRECTED 100 each 1   insulin glargine (LANTUS SOLOSTAR) 100 UNIT/ML Solostar Pen Inject 22 Units into the skin at bedtime. 30 mL 0   linagliptin (TRADJENTA) 5 MG TABS tablet Take 1 tablet (5 mg total) by mouth daily.     midodrine (PROAMATINE) 5 MG tablet Take 3 tablets (15 mg total) by mouth 3 (three) times daily with meals. 90 tablet 1   ONETOUCH ULTRA test strip USE TO CHECK BLOOD SUGAR TWICE DAILY 200 strip 2   oxybutynin (DITROPAN) 5 MG tablet Take 1 tablet (5 mg total) by mouth 2 (two) times daily. 60 tablet 1   oxyCODONE (OXY IR/ROXICODONE) 5 MG immediate release tablet Take 1 tablet (5 mg total) by mouth every 6 (six) hours as needed for severe pain. 30 tablet 0   pantoprazole (PROTONIX) 40 MG tablet TAKE 1 TABLET TWICE DAILY BEFORE A MEAL 60 tablet 3   sevelamer (RENAGEL) 800 MG tablet Take 2 tablets (1,600 mg total) by mouth 3 (three) times daily with meals. 180 tablet 1   Vitamin D, Ergocalciferol, (DRISDOL) 1.25 MG (50000 UNIT) CAPS capsule Take 50,000 Units by mouth every 7 (seven) days. Monday     No current facility-administered medications for this visit.    Past Medical History:  Diagnosis Date   Anemia    Arthritis    Asthma    Bell palsy    CAD (coronary artery disease)    a. 2014 MV: abnl w/ infap ischemia; b. 03/2013 Cath: aneurysmal bleb in the LAD w/ otw nonobs dzs-->Med Rx.   Chronic back pain    Chronic knee pain    a. 09/2015 s/p R TKA.   Chronic pain    Chronic shoulder  pain    Chronic sinusitis    COPD (chronic obstructive pulmonary disease) ([place])    Diabetes mellitus without complication ([place])    type II    ESRD on peritoneal dialysis ([place])    on peritoneal dialysis, [facility]   Essential hypertension    GERD (gastroesophageal reflux disease)    Gout    Gout    Hepatomegaly    noted on noncontrast CT 2015   History of hiatal hernia    Hyperlipidemia    Lateral meniscus tear    Obesity    Truncal   Obstructive sleep apnea    does not use cpap    On home oxygen therapy    uses 2l when is going somewhere per patient    PUD (peptic ulcer disease)  remote, reports f/u EGD about 8 years ago unremarkable    Reactive airway disease    related to exposure to chemical during 9/11   Sinusitis    Vitamin D deficiency     Past Surgical History:  Procedure Laterality Date   ASAD LT SHOULDER  12/15/2008   left shoulder   AV FISTULA PLACEMENT Left 08/09/2016   Procedure: BRACHIOCEPHALIC ARTERIOVENOUS (AV) FISTULA CREATION LEFT ARM;  Surgeon: [name], MD;  Location: [place];  Service: Vascular;  Laterality: Left;   CAPD INSERTION N/A 10/07/2018   Procedure: LAPAROSCOPIC PERITONEAL CATHETER PLACEMENT;  Surgeon: Mickeal Skinner, MD;  [facility];  Service: General;  Laterality: N/A;   CATARACT EXTRACTION W/PHACO Left 03/28/2016   Procedure: CATARACT EXTRACTION PHACO AND INTRAOCULAR LENS PLACEMENT LEFT EYE;  Surgeon: [name], MD;  Location: [facility];  Service: Ophthalmology;  Laterality: Left;  CDE: 4.77   CATARACT EXTRACTION W/PHACO Right 04/11/2016   Procedure: CATARACT EXTRACTION PHACO AND INTRAOCULAR LENS PLACEMENT RIGHT EYE; CDE:  4.74;  Surgeon: [name], MD;  Location: [facility];  Service: Ophthalmology;  Laterality: Right;   COLONOSCOPY  10/15/2008   Fields: Rectal polyp obliterated, not retrieved, hemorrhoids, single ascending colon diverticulum near the CV. Next colonoscopy April 2020   COLONOSCOPY N/A 12/25/2014    SLF: 1. Colorectal polyps (2) removed 2. Small internal hemorrhoids 3. the left colon is severely redundant. hyperplastic polyps   CORONARY ARTERY BYPASS GRAFT N/A 06/05/2022   Procedure: OFF PUMP CORONARY ARTERY BYPASS GRAFTING (CABG) X 2 BYPASSES USING LEFT INTERNAL MAMMARY ARTERY AND RIGHT LEG GREATER SAPHENOUS VEIN HARVESTED ENDOSCOPICALLY;  Surgeon: Lajuana Matte, MD;  Location: [place];  Service: Open Heart Surgery;  Laterality: N/A;   CORONARY STENT INTERVENTION N/A 07/25/2021   Procedure: CORONARY STENT INTERVENTION;  Surgeon: Sherren Mocha, MD;  Location: [place] [facility];  Service: Cardiovascular;  Laterality: N/A;   CORONARY STENT INTERVENTION N/A 12/26/2021   Procedure: CORONARY STENT INTERVENTION;  Surgeon: Nelva Bush, MD;  Location: [place] [facility];  Service: Cardiovascular;  Laterality: N/A;   CORONARY STENT INTERVENTION N/A 01/20/2022   Procedure: CORONARY STENT INTERVENTION;  Surgeon: Sherren Mocha, MD;  Location: [place] [facility];  Service: Cardiovascular;  Laterality: N/A;   DOPPLER ECHOCARDIOGRAPHY     ESOPHAGOGASTRODUODENOSCOPY N/A 12/25/2014   SLF: 1. Anemia most likely due to CRI, gastritis, gastric polyps 2. Moderate non-erosive gastriits and mild duodenitis.  3.TWo large gstric polyps removed.    EYE SURGERY  12/22/2010   tear duct probing-   FOREIGN BODY REMOVAL  03/29/2011   Procedure: REMOVAL FOREIGN BODY EXTREMITY;  Surgeon: Arther Abbott, MD;  [facility];  Service: Orthopedics;  Laterality: Right;  Removal Foreign Body Right Thumb   IR FLUORO GUIDE CV LINE RIGHT  08/06/2018   IR US GUIDE VASC ACCESS RIGHT  08/06/2018   KNEE ARTHROSCOPY  10/16/2007   left   KNEE ARTHROSCOPY WITH LATERAL MENISECTOMY Right 10/14/2015   Procedure: LEFT KNEE ARTHROSCOPY WITH PARTIAL LATERAL MENISECTOMY;  Surgeon: Carole Civil, MD;  [facility];  Service: Orthopedics;  Laterality: Right;   LEFT HEART CATH AND CORONARY ANGIOGRAPHY N/A  07/25/2021   Procedure: LEFT HEART CATH AND CORONARY ANGIOGRAPHY;  Surgeon: Sherren Mocha, MD;  Location: [place] [facility];  Service: Cardiovascular;  Laterality: N/A;   LEFT HEART CATH AND CORONARY ANGIOGRAPHY N/A 12/26/2021   Procedure: LEFT HEART CATH AND CORONARY ANGIOGRAPHY;  Surgeon: Nelva Bush, MD;  Location: [place] [facility];  Service: Cardiovascular;  Laterality: N/A;   LEFT HEART CATH AND CORONARY ANGIOGRAPHY N/A 01/20/2022   Procedure: LEFT HEART CATH AND CORONARY ANGIOGRAPHY;  Surgeon: Sherren Mocha, MD;  Location: [place] [facility];  Service: Cardiovascular;  Laterality: N/A;   LEFT HEART CATHETERIZATION WITH CORONARY ANGIOGRAM N/A 03/28/2013   Procedure: LEFT HEART CATHETERIZATION WITH CORONARY ANGIOGRAM;  Surgeon: Leonie Man, MD;  Location: [facility];  Service: Cardiovascular;  Laterality: N/A;   NM MYOVIEW LTD     PENILE PROSTHESIS IMPLANT N/A 08/16/2015   Procedure: PENILE PROTHESIS INFLATABLE, three piece, Excisional biopsy of Penile ulcer, Penile molding;  Surgeon: Carolan Clines, MD;  [facility];  Service: Urology;  Laterality: N/A;   PENILE PROSTHESIS IMPLANT N/A 12/24/2017   Procedure: REMOVAL AND  REPLACEMENT  COLOPLAST PENILE PROSTHESIS;  Surgeon: Lucas Mallow, MD;  [facility];  Service: Urology;  Laterality: N/A;   QUADRICEPS TENDON REPAIR  07/21/2011   Procedure: REPAIR QUADRICEP TENDON;  Surgeon: Arther Abbott, MD;  [facility];  Service: Orthopedics;  Laterality: Right;   RIGHT/LEFT HEART CATH AND CORONARY ANGIOGRAPHY N/A 05/30/2022   Procedure: RIGHT/LEFT HEART CATH AND CORONARY ANGIOGRAPHY;  Surgeon: Jettie Booze, MD;  Location: [place] [facility];  Service: Cardiovascular;  Laterality: N/A;   TEE WITHOUT CARDIOVERSION N/A 06/05/2022   Procedure: TRANSESOPHAGEAL ECHOCARDIOGRAM (TEE);  Surgeon: Lajuana Matte, MD;  Location: [place];  Service: Open Heart Surgery;  Laterality: N/A;   TOENAIL EXCISION      removed [id number]   UMBILICAL HERNIA REPAIR  07/17/2005   [place]    Family History  Problem Relation Age of Onset   Hypertension Mother        MI   Cancer Mother        breast    Diabetes Mother    Diabetes Father    Hypertension Father    Hypertension Sister    Diabetes Sister    Arthritis Other    Asthma Other    Lung disease Other    Anesthesia problems Neg Hx    Hypotension Neg Hx    Malignant hyperthermia Neg Hx    Pseudochol deficiency Neg Hx    Colon cancer Neg Hx     Allergies as of 07/04/2022 - Review Complete 07/04/2022  Allergen Reaction Noted   Opana [oxymorphone hcl] Itching 01/15/2014   Tramadol Itching 07/10/2011    Social History   Socioeconomic History   Marital status: Married    Spouse name: Not on file   Number of children: 2   Years of education: 12th grade   Highest education level: Not on file  Occupational History   Occupation: disabled   Occupation:      Fish farm manager: UNEMPLOYED  Tobacco Use   Smoking status: Former    Packs/day: 1.00    Years: 25.00    Total pack years: 25.00    Types: Cigarettes    Quit date: 03/27/2010    Years since quitting: 12.2   Smokeless tobacco: Never   Tobacco comments:    Quit x 7 years  Vaping Use   Vaping Use: Never used  Substance and Sexual Activity   Alcohol use: Not Currently    Comment: occasionally   Drug use: Yes    Types: Marijuana    Comment: cocaine- last time used- 11/24/2017 , marijuana-    Sexual activity: Yes  Other Topics Concern   Not on file  Social History Narrative   He  quit smoking in 2010. 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 He developed pulmonary problems, became disabled because of lower airway disease in 2009.       WATCHES BASKETBALL. HIS TEAM IS [place].   Social Determinants of Health   Financial Resource Strain: Low Risk  (12/29/2021)   Overall Financial Resource Strain (CARDIA)    Difficulty of Paying Living Expenses:  Not hard at all  Food Insecurity: No Food Insecurity (06/07/2022)   Hunger Vital Sign    Worried About Running Out of Food in the Last Year: Never true    Ran Out of Food in the Last Year: Never true  Transportation Needs: No Transportation Needs (06/07/2022)   PRAPARE - Hydrologist (Medical): No    Lack of Transportation (Non-Medical): No  Physical Activity: Sufficiently Active (12/29/2021)   Exercise Vital Sign    Days of Exercise per Week: 3 days    Minutes of Exercise per Session: 60 min  Stress: No Stress Concern Present (12/29/2021)   [place]    Feeling of Stress : Not at all  Social Connections: [place] (12/29/2021)   Social Connection and Isolation Panel [NHANES]    Frequency of Communication with Friends and Family: More than three times a week    Frequency of Social Gatherings with Friends and Family: More than three times a week    Attends Religious Services: More than 4 times per year    Active Member of [employer] or Organizations: Yes    Attends Music therapist: More than 4 times per year    Marital Status: Married     Review of Systems   Gen: Denies fever, chills, anorexia. Denies fatigue, weakness, weight loss.  CV: Denies chest pain, palpitations, syncope, peripheral edema, and claudication. Resp: Denies dyspnea at rest, cough, wheezing, coughing up blood, and pleurisy. GI: See HPI Derm: Denies rash, itching, dry skin Psych: Denies depression, anxiety, memory loss, confusion. No homicidal or suicidal ideation.  Heme: Denies bruising, bleeding, and enlarged lymph nodes.   Physical Exam   BP 108/68 (BP Location: Right Arm, Patient Position: Sitting, Cuff Size: Large)   Pulse 65   Temp 97.7 F (36.5 C) (Temporal)   Ht '5\' 4"'$  (1.626 m)   Wt 215 lb 12.8 oz (97.9 kg)   SpO2 97%   BMI 37.04 kg/m   General:   Alert and oriented. No distress  noted. Pleasant and cooperative.  Head:  Normocephalic and atraumatic. Eyes:  Conjuctiva clear without scleral icterus. Lungs:  Clear to auscultation bilaterally. No wheezes, rales, or rhonchi. No distress.  Heart:  S1, S2 present without murmurs appreciated.  Abdomen:  +BS, soft, non-distended.  Moderate TTP to epigastric region.  No rebound or guarding. No HSM or masses noted.  Peritoneal dialysis catheter site intact. Rectal: deferred Msk:  Symmetrical without gross deformities. Normal posture. Extremities:  Without edema. Neurologic:  Alert and  oriented x4 Psych:  Alert and cooperative. Normal mood and affect.   Assessment  [name] [name] is a 71 y.o. male with a history of chronic GERD, hemorrhoids, anemia, CAD (recent open heart surgery in November 2023), STEMI s/p DES to RCA in January 2023 (on DAPT), chronic pain, COPD, type 2 diabetes, ESRD on peritoneal dialysis, HTN, peptic ulcer disease, OSA, vitamin D deficiency and gout presenting today with complaint of food sitting in the top of his chest,  nausea but unable to vomit, and inability to belch.  GERD, early satiety: Chronic.  Denies any dysphagia.  Has ongoing constant sensation of feeling like acid is sitting in his throat/upper chest.  Has been maintained on pantoprazole 40 mg twice daily long-term.  Treated with PPI during recent hospitalization with open heart surgery.  Suspect he may be having a flare of GERD related to recent stress and body healing regarding recent surgery.  Does have some early satiety as well as a fullness sensation and intermittent upper abdominal pain.  Does have some nausea but without any vomiting.  We will plan to stop pantoprazole and start Nexium 40 mg twice daily.  Advised he may take famotidine once or twice daily as needed for breakthrough symptoms.  Regarding his sensation of feeling like he needs to burp but not able to, likely secondary to gas.  Advised him to take Gas-X or Phazyme, max dose of 250  mg daily for relief of symptoms.  Would avoid Tums or Mylanta given potential for excess calcium given his history of end-stage renal disease.  PLAN   Stop pantoprazole, start Nexium 40 mg twice daily Famotidine 20 mg once or twice daily as needed for any breakthrough symptoms Gas-X or Phazyme as needed, max of 250 mg daily. GERD diet/lifestyle modifications If ongoing feelings of acid reflux or any development of dysphagia, can evaluate with BP or consider H. pylori testing.  Will hold off on endoscopic evaluation right now given recent open heart surgery within the last month. Follow-up in 2-3 months.    Venetia Night, MSN, FNP-BC, AGACNP-BC [facility]

## 2022-07-04 ENCOUNTER — Encounter: Payer: Self-pay | Admitting: Gastroenterology

## 2022-07-04 ENCOUNTER — Ambulatory Visit (INDEPENDENT_AMBULATORY_CARE_PROVIDER_SITE_OTHER): Payer: Medicare Other | Admitting: Gastroenterology

## 2022-07-04 VITALS — BP 108/68 | HR 65 | Temp 97.7°F | Ht 64.0 in | Wt 215.8 lb

## 2022-07-04 DIAGNOSIS — N250 Renal osteodystrophy: Secondary | ICD-10-CM | POA: Diagnosis not present

## 2022-07-04 DIAGNOSIS — N186 End stage renal disease: Secondary | ICD-10-CM | POA: Diagnosis not present

## 2022-07-04 DIAGNOSIS — D631 Anemia in chronic kidney disease: Secondary | ICD-10-CM | POA: Diagnosis not present

## 2022-07-04 DIAGNOSIS — Z992 Dependence on renal dialysis: Secondary | ICD-10-CM | POA: Diagnosis not present

## 2022-07-04 DIAGNOSIS — D509 Iron deficiency anemia, unspecified: Secondary | ICD-10-CM | POA: Diagnosis not present

## 2022-07-04 DIAGNOSIS — R6881 Early satiety: Secondary | ICD-10-CM

## 2022-07-04 DIAGNOSIS — K219 Gastro-esophageal reflux disease without esophagitis: Secondary | ICD-10-CM | POA: Diagnosis not present

## 2022-07-04 MED ORDER — ESOMEPRAZOLE MAGNESIUM 40 MG PO CPDR: 40.0000 mg | DELAYED_RELEASE_CAPSULE | Freq: Two times a day (BID) | ORAL | 2 refills | Status: DC

## 2022-07-04 NOTE — Patient Instructions (Signed)
Please stop taking pantoprazole 40 mg twice daily. Begin taking Nexium 40 mg twice daily.  For any breakthrough symptoms, or constant sensation of acid in your throat after switching medication for couple weeks, you may use famotidine 20 mg once or twice daily as needed.  For fullness feeling, or increased gas production you may take simethicone (Gas-X, Phazyme) once or twice daily as needed.  Gas-X is usually 80 mg, Phazyme and is usually around 125 or 250 mg.  You may take a total of 250 mg/day for relief.  Follow a GERD diet:  Avoid fried, fatty, greasy, spicy, citrus foods. Avoid caffeine and carbonated beverages. Avoid chocolate. Try eating 4-6 small meals a day rather than 3 large meals. Do not eat within 3 hours of laying down. Prop head of bed up on wood or bricks to create a 6 inch incline.   If you continue to have issues after next few weeks of changing medication, please contact the office.  We will see you in 2 months for follow-up.  It was a pleasure to see you today. I want to create trusting relationships with patients. If you receive a survey regarding your visit,  I greatly appreciate you taking time to fill this out on paper or through your MyChart. I value your feedback.  Venetia Night, MSN, FNP-BC, AGACNP-BC [facility]

## 2022-07-05 DIAGNOSIS — Z992 Dependence on renal dialysis: Secondary | ICD-10-CM | POA: Diagnosis not present

## 2022-07-05 DIAGNOSIS — D631 Anemia in chronic kidney disease: Secondary | ICD-10-CM | POA: Diagnosis not present

## 2022-07-05 DIAGNOSIS — N186 End stage renal disease: Secondary | ICD-10-CM | POA: Diagnosis not present

## 2022-07-05 DIAGNOSIS — D509 Iron deficiency anemia, unspecified: Secondary | ICD-10-CM | POA: Diagnosis not present

## 2022-07-05 DIAGNOSIS — N250 Renal osteodystrophy: Secondary | ICD-10-CM | POA: Diagnosis not present

## 2022-07-06 DIAGNOSIS — D509 Iron deficiency anemia, unspecified: Secondary | ICD-10-CM | POA: Diagnosis not present

## 2022-07-06 DIAGNOSIS — Z992 Dependence on renal dialysis: Secondary | ICD-10-CM | POA: Diagnosis not present

## 2022-07-06 DIAGNOSIS — I214 Non-ST elevation (NSTEMI) myocardial infarction: Secondary | ICD-10-CM | POA: Diagnosis not present

## 2022-07-06 DIAGNOSIS — I132 Hypertensive heart and chronic kidney disease with heart failure and with stage 5 chronic kidney disease, or end stage renal disease: Secondary | ICD-10-CM | POA: Diagnosis not present

## 2022-07-06 DIAGNOSIS — Z951 Presence of aortocoronary bypass graft: Secondary | ICD-10-CM | POA: Diagnosis not present

## 2022-07-06 DIAGNOSIS — N186 End stage renal disease: Secondary | ICD-10-CM | POA: Diagnosis not present

## 2022-07-06 DIAGNOSIS — I509 Heart failure, unspecified: Secondary | ICD-10-CM | POA: Diagnosis not present

## 2022-07-06 DIAGNOSIS — N250 Renal osteodystrophy: Secondary | ICD-10-CM | POA: Diagnosis not present

## 2022-07-06 DIAGNOSIS — I2510 Atherosclerotic heart disease of native coronary artery without angina pectoris: Secondary | ICD-10-CM | POA: Diagnosis not present

## 2022-07-06 DIAGNOSIS — D631 Anemia in chronic kidney disease: Secondary | ICD-10-CM | POA: Diagnosis not present

## 2022-07-06 DIAGNOSIS — Z48812 Encounter for surgical aftercare following surgery on the circulatory system: Secondary | ICD-10-CM | POA: Diagnosis not present

## 2022-07-07 DIAGNOSIS — D631 Anemia in chronic kidney disease: Secondary | ICD-10-CM | POA: Diagnosis not present

## 2022-07-07 DIAGNOSIS — Z992 Dependence on renal dialysis: Secondary | ICD-10-CM | POA: Diagnosis not present

## 2022-07-07 DIAGNOSIS — N186 End stage renal disease: Secondary | ICD-10-CM | POA: Diagnosis not present

## 2022-07-07 DIAGNOSIS — N250 Renal osteodystrophy: Secondary | ICD-10-CM | POA: Diagnosis not present

## 2022-07-07 DIAGNOSIS — D509 Iron deficiency anemia, unspecified: Secondary | ICD-10-CM | POA: Diagnosis not present

## 2022-07-08 DIAGNOSIS — Z992 Dependence on renal dialysis: Secondary | ICD-10-CM | POA: Diagnosis not present

## 2022-07-08 DIAGNOSIS — D631 Anemia in chronic kidney disease: Secondary | ICD-10-CM | POA: Diagnosis not present

## 2022-07-08 DIAGNOSIS — N186 End stage renal disease: Secondary | ICD-10-CM | POA: Diagnosis not present

## 2022-07-08 DIAGNOSIS — D509 Iron deficiency anemia, unspecified: Secondary | ICD-10-CM | POA: Diagnosis not present

## 2022-07-08 DIAGNOSIS — N250 Renal osteodystrophy: Secondary | ICD-10-CM | POA: Diagnosis not present

## 2022-07-09 DIAGNOSIS — D631 Anemia in chronic kidney disease: Secondary | ICD-10-CM | POA: Diagnosis not present

## 2022-07-09 DIAGNOSIS — N250 Renal osteodystrophy: Secondary | ICD-10-CM | POA: Diagnosis not present

## 2022-07-09 DIAGNOSIS — Z992 Dependence on renal dialysis: Secondary | ICD-10-CM | POA: Diagnosis not present

## 2022-07-09 DIAGNOSIS — N186 End stage renal disease: Secondary | ICD-10-CM | POA: Diagnosis not present

## 2022-07-09 DIAGNOSIS — D509 Iron deficiency anemia, unspecified: Secondary | ICD-10-CM | POA: Diagnosis not present

## 2022-07-10 DIAGNOSIS — N250 Renal osteodystrophy: Secondary | ICD-10-CM | POA: Diagnosis not present

## 2022-07-10 DIAGNOSIS — D509 Iron deficiency anemia, unspecified: Secondary | ICD-10-CM | POA: Diagnosis not present

## 2022-07-10 DIAGNOSIS — Z992 Dependence on renal dialysis: Secondary | ICD-10-CM | POA: Diagnosis not present

## 2022-07-10 DIAGNOSIS — D631 Anemia in chronic kidney disease: Secondary | ICD-10-CM | POA: Diagnosis not present

## 2022-07-10 DIAGNOSIS — N186 End stage renal disease: Secondary | ICD-10-CM | POA: Diagnosis not present

## 2022-07-11 ENCOUNTER — Other Ambulatory Visit: Payer: Self-pay | Admitting: Physician Assistant

## 2022-07-11 DIAGNOSIS — D631 Anemia in chronic kidney disease: Secondary | ICD-10-CM | POA: Diagnosis not present

## 2022-07-11 DIAGNOSIS — N250 Renal osteodystrophy: Secondary | ICD-10-CM | POA: Diagnosis not present

## 2022-07-11 DIAGNOSIS — Z992 Dependence on renal dialysis: Secondary | ICD-10-CM | POA: Diagnosis not present

## 2022-07-11 DIAGNOSIS — N186 End stage renal disease: Secondary | ICD-10-CM | POA: Diagnosis not present

## 2022-07-11 DIAGNOSIS — D509 Iron deficiency anemia, unspecified: Secondary | ICD-10-CM | POA: Diagnosis not present

## 2022-07-12 ENCOUNTER — Encounter: Payer: Self-pay | Admitting: Nurse Practitioner

## 2022-07-12 ENCOUNTER — Ambulatory Visit (INDEPENDENT_AMBULATORY_CARE_PROVIDER_SITE_OTHER): Payer: Medicare Other | Admitting: Nurse Practitioner

## 2022-07-12 VITALS — BP 101/69 | Ht 64.0 in | Wt 208.2 lb

## 2022-07-12 DIAGNOSIS — E1122 Type 2 diabetes mellitus with diabetic chronic kidney disease: Secondary | ICD-10-CM | POA: Diagnosis not present

## 2022-07-12 DIAGNOSIS — N185 Chronic kidney disease, stage 5: Secondary | ICD-10-CM | POA: Diagnosis not present

## 2022-07-12 DIAGNOSIS — I10 Essential (primary) hypertension: Secondary | ICD-10-CM | POA: Diagnosis not present

## 2022-07-12 DIAGNOSIS — Z992 Dependence on renal dialysis: Secondary | ICD-10-CM | POA: Diagnosis not present

## 2022-07-12 DIAGNOSIS — E782 Mixed hyperlipidemia: Secondary | ICD-10-CM | POA: Diagnosis not present

## 2022-07-12 DIAGNOSIS — N186 End stage renal disease: Secondary | ICD-10-CM | POA: Diagnosis not present

## 2022-07-12 DIAGNOSIS — D509 Iron deficiency anemia, unspecified: Secondary | ICD-10-CM | POA: Diagnosis not present

## 2022-07-12 DIAGNOSIS — N250 Renal osteodystrophy: Secondary | ICD-10-CM | POA: Diagnosis not present

## 2022-07-12 DIAGNOSIS — D631 Anemia in chronic kidney disease: Secondary | ICD-10-CM | POA: Diagnosis not present

## 2022-07-12 MED ORDER — LANTUS SOLOSTAR 100 UNIT/ML ~~LOC~~ SOPN: 22.0000 [IU] | PEN_INJECTOR | Freq: Every day | SUBCUTANEOUS | 3 refills | Status: DC

## 2022-07-12 MED ORDER — LINAGLIPTIN 5 MG PO TABS: 5.0000 mg | ORAL_TABLET | Freq: Every day | ORAL | 3 refills | Status: DC

## 2022-07-12 NOTE — Progress Notes (Signed)
07/12/2022   Endocrinology follow-up note   Subjective:    Patient ID: [name] [name], male    DOB: Aug 25, 1950, PCP Carrolyn Meiers, MD   Past Medical History:  Diagnosis Date   Anemia    Arthritis    Asthma    Bell palsy    CAD (coronary artery disease)    a. 2014 MV: abnl w/ infap ischemia; b. 03/2013 Cath: aneurysmal bleb in the LAD w/ otw nonobs dzs-->Med Rx.   Chronic back pain    Chronic knee pain    a. 09/2015 s/p R TKA.   Chronic pain    Chronic shoulder pain    Chronic sinusitis    COPD (chronic obstructive pulmonary disease) ([place])    Diabetes mellitus without complication ([place])    type II    ESRD on peritoneal dialysis ([place])    on peritoneal dialysis, [facility]   Essential hypertension    GERD (gastroesophageal reflux disease)    Gout    Gout    Hepatomegaly    noted on noncontrast CT 2015   History of hiatal hernia    Hyperlipidemia    Lateral meniscus tear    Obesity    [place]un[place]   Obstructive sleep apnea    does not use cpap    On home oxygen therapy    uses 2l when is going somewhere per patient    PUD (peptic ulcer disease)    remote, reports f/u EGD about 8 years ago unremarkable    Reactive airway disease    related to exposure to chemical during 9/11   Sinusitis    Vitamin D deficiency    Past Surgical History:  Procedure Laterality Date   ASAD LT SHOULDER  12/15/2008   left shoulder   AV FISTULA PLACEMENT Left 08/09/2016   Procedure: BRACHIOCEPHALIC ARTERIOVENOUS (AV) FISTULA CREATION LEFT ARM;  Surgeon: [name], MD;  Location: [place];  Service: Vascular;  Laterality: Left;   CAPD INSERTION N/A 10/07/2018   Procedure: LAPAROSCOPIC PERITONEAL CATHETER PLACEMENT;  Surgeon: Mickeal Skinner, MD;  [facility];  Service: General;  Laterality: N/A;   CATARACT EXTRACTION W/PHACO Left 03/28/2016   Procedure: CATARACT EXTRACTION PHACO AND INTRAOCULAR LENS PLACEMENT LEFT EYE;  Surgeon: [name],  MD;  Location: [facility];  Service: Ophthalmology;  Laterality: Left;  CDE: 4.77   CATARACT EXTRACTION W/PHACO Right 04/11/2016   Procedure: CATARACT EXTRACTION PHACO AND INTRAOCULAR LENS PLACEMENT RIGHT EYE; CDE:  4.74;  Surgeon: [name], MD;  Location: [facility];  Service: Ophthalmology;  Laterality: Right;   COLONOSCOPY  10/15/2008   Fields: Rectal polyp obliterated, not retrieved, hemorrhoids, single ascending colon diverticulum near the CV. Next colonoscopy April 2020   COLONOSCOPY N/A 12/25/2014   SLF: 1. Colorectal polyps (2) removed 2. Small internal hemorrhoids 3. the left colon is severely redundant. hyperplastic polyps   CORONARY ARTERY BYPASS GRAFT N/A 06/05/2022   Procedure: OFF PUMP CORONARY ARTERY BYPASS GRAFTING (CABG) X 2 BYPASSES USING LEFT INTERNAL MAMMARY ARTERY AND RIGHT LEG GREATER SAPHENOUS VEIN HARVESTED ENDOSCOPICALLY;  Surgeon: Lajuana Matte, MD;  Location: [place];  Service: Open Heart Surgery;  Laterality: N/A;   CORONARY STENT INTERVENTION N/A 07/25/2021   Procedure: CORONARY STENT INTERVENTION;  Surgeon: Sherren Mocha, MD;  Location: [place] [facility];  Service: Cardiovascular;  Laterality: N/A;   CORONARY STENT INTERVENTION N/A 12/26/2021   Procedure: CORONARY STENT INTERVENTION;  Surgeon: Nelva Bush, MD;  Location: [place] [facility];  Service: Cardiovascular;  Laterality: N/A;   CORONARY STENT INTERVENTION N/A 01/20/2022   Procedure: CORONARY STENT INTERVENTION;  Surgeon: Sherren Mocha, MD;  Location: [place] [facility];  Service: Cardiovascular;  Laterality: N/A;   DOPPLER ECHOCARDIOGRAPHY     ESOPHAGOGASTRODUODENOSCOPY N/A 12/25/2014   SLF: 1. Anemia most likely due to CRI, gastritis, gastric polyps 2. Moderate non-erosive gastriits and mild duodenitis.  3.TWo large gstric polyps removed.    EYE SURGERY  12/22/2010   tear duct probing-[place]   FOREIGN BODY REMOVAL  03/29/2011   Procedure: REMOVAL FOREIGN BODY EXTREMITY;  Surgeon: Arther Abbott,  MD;  [facility];  Service: Orthopedics;  Laterality: Right;  Removal Foreign Body Right Thumb   IR FLUORO GUIDE CV LINE RIGHT  08/06/2018   IR US GUIDE VASC ACCESS RIGHT  08/06/2018   KNEE ARTHROSCOPY  10/16/2007   left   KNEE ARTHROSCOPY WITH LATERAL MENISECTOMY Right 10/14/2015   Procedure: LEFT KNEE ARTHROSCOPY WITH PARTIAL LATERAL MENISECTOMY;  Surgeon: Carole Civil, MD;  [facility];  Service: Orthopedics;  Laterality: Right;   LEFT HEART CATH AND CORONARY ANGIOGRAPHY N/A 07/25/2021   Procedure: LEFT HEART CATH AND CORONARY ANGIOGRAPHY;  Surgeon: Sherren Mocha, MD;  Location: [place] [facility];  Service: Cardiovascular;  Laterality: N/A;   LEFT HEART CATH AND CORONARY ANGIOGRAPHY N/A 12/26/2021   Procedure: LEFT HEART CATH AND CORONARY ANGIOGRAPHY;  Surgeon: Nelva Bush, MD;  Location: [place] [facility];  Service: Cardiovascular;  Laterality: N/A;   LEFT HEART CATH AND CORONARY ANGIOGRAPHY N/A 01/20/2022   Procedure: LEFT HEART CATH AND CORONARY ANGIOGRAPHY;  Surgeon: Sherren Mocha, MD;  Location: [place] [facility];  Service: Cardiovascular;  Laterality: N/A;   LEFT HEART CATHETERIZATION WITH CORONARY ANGIOGRAM N/A 03/28/2013   Procedure: LEFT HEART CATHETERIZATION WITH CORONARY ANGIOGRAM;  Surgeon: Leonie Man, MD;  Location: [facility];  Service: Cardiovascular;  Laterality: N/A;   NM MYOVIEW LTD     PENILE PROSTHESIS IMPLANT N/A 08/16/2015   Procedure: PENILE PROTHESIS INFLATABLE, three piece, Excisional biopsy of Penile ulcer, Penile molding;  Surgeon: Carolan Clines, MD;  [facility];  Service: Urology;  Laterality: N/A;   PENILE PROSTHESIS IMPLANT N/A 12/24/2017   Procedure: REMOVAL AND  REPLACEMENT  COLOPLAST PENILE PROSTHESIS;  Surgeon: Lucas Mallow, MD;  [facility];  Service: Urology;  Laterality: N/A;   QUADRICEPS TENDON REPAIR  07/21/2011   Procedure: REPAIR QUADRICEP TENDON;  Surgeon: Arther Abbott, MD;  [facility];   Service: Orthopedics;  Laterality: Right;   RIGHT/LEFT HEART CATH AND CORONARY ANGIOGRAPHY N/A 05/30/2022   Procedure: RIGHT/LEFT HEART CATH AND CORONARY ANGIOGRAPHY;  Surgeon: Jettie Booze, MD;  Location: [place] [facility];  Service: Cardiovascular;  Laterality: N/A;   TEE WITHOUT CARDIOVERSION N/A 06/05/2022   Procedure: TRANSESOPHAGEAL ECHOCARDIOGRAM (TEE);  Surgeon: Lajuana Matte, MD;  Location: [place];  Service: Open Heart Surgery;  Laterality: N/A;   TOENAIL EXCISION     removed [id number]   UMBILICAL HERNIA REPAIR  07/17/2005   [place]   Social History   Socioeconomic History   Marital status: Married    Spouse name: Not on file   Number of children: 2   Years of education: 12th grade   Highest education level: Not on file  Occupational History   Occupation: disabled   Occupation:      Fish farm manager: UNEMPLOYED  Tobacco Use   Smoking  status: Former    Packs/day: 1.00    Years: 25.00    Total pack years: 25.00    Types: Cigarettes    Quit date: 03/27/2010    Years since quitting: 12.3   Smokeless tobacco: Never   Tobacco comments:    Quit x 7 years  Vaping Use   Vaping Use: Never used  Substance and Sexual Activity   Alcohol use: Not Currently    Comment: occasionally   Drug use: Yes    Types: Marijuana    Comment: cocaine- last time used- 11/24/2017 , marijuana-    Sexual activity: Yes  Other Topics Concern   Not on file  Social History Narrative   He quit smoking in 2010. 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 He developed pulmonary problems, became disabled because of lower airway disease in 2009.       WATCHES BASKETBALL. HIS TEAM IS [employer].   Social Determinants of Health   Financial Resource Strain: Low Risk  (12/29/2021)   Overall Financial Resource Strain (CARDIA)    Difficulty of Paying Living Expenses: Not hard at all  Food Insecurity: No Food Insecurity (06/07/2022)   Hunger Vital Sign     Worried About Running Out of Food in the Last Year: Never true    Ran Out of Food in the Last Year: Never true  Transportation Needs: No Transportation Needs (06/07/2022)   PRAPARE - Hydrologist (Medical): No    Lack of Transportation (Non-Medical): No  Physical Activity: Sufficiently Active (12/29/2021)   Exercise Vital Sign    Days of Exercise per Week: 3 days    Minutes of Exercise per Session: 60 min  Stress: No Stress Concern Present (12/29/2021)   [place]    Feeling of Stress : Not at all  Social Connections: [place] (12/29/2021)   Social Connection and Isolation Panel [NHANES]    Frequency of Communication with Friends and Family: More than three times a week    Frequency of Social Gatherings with Friends and Family: More than three times a week    Attends Religious Services: More than 4 times per year    Active Member of [employer] or Organizations: Yes    Attends Archivist Meetings: More than 4 times per year    Marital Status: Married   Outpatient Encounter Medications as of 07/12/2022  Medication Sig   albuterol (VENTOLIN HFA) 108 (90 Base) MCG/ACT inhaler Inhale 2 puffs into the lungs every 6 (six) hours as needed for wheezing or shortness of breath.   aspirin EC 81 MG tablet Take 81 mg by mouth daily. Swallow whole.   atorvastatin (LIPITOR) 80 MG tablet Take 1 tablet (80 mg total) by mouth daily.   calcitRIOL (ROCALTROL) 0.25 MCG capsule Take 0.25 mcg by mouth daily.   clopidogrel (PLAVIX) 75 MG tablet Take 1 tablet (75 mg total) by mouth daily. Please take first dose at 9 pm on 12/4   DROPLET PEN NEEDLES 31G X 5 MM MISC USE TO INJECT INSULIN DAILY AS DIRECTED   esomeprazole (NEXIUM) 40 MG capsule Take 1 capsule (40 mg total) by mouth 2 (two) times daily before a meal.   midodrine (PROAMATINE) 5 MG tablet Take 3 tablets (15 mg total) by mouth 3 (three) times  daily with meals.   oxyCODONE (OXY IR/ROXICODONE) 5 MG immediate release tablet Take 1 tablet (5 mg total)  by mouth every 6 (six) hours as needed for severe pain.   sevelamer (RENAGEL) 800 MG tablet Take 2 tablets (1,600 mg total) by mouth 3 (three) times daily with meals.   Vitamin D, Ergocalciferol, (DRISDOL) 1.25 MG (50000 UNIT) CAPS capsule Take 50,000 Units by mouth every 7 (seven) days. Monday   [DISCONTINUED] insulin glargine (LANTUS SOLOSTAR) 100 UNIT/ML Solostar Pen Inject 22 Units into the skin at bedtime.   [DISCONTINUED] linagliptin (TRADJENTA) 5 MG TABS tablet Take 1 tablet (5 mg total) by mouth daily.   insulin glargine (LANTUS SOLOSTAR) 100 UNIT/ML Solostar Pen Inject 22 Units into the skin at bedtime.   linagliptin (TRADJENTA) 5 MG TABS tablet Take 1 tablet (5 mg total) by mouth daily.   ONETOUCH ULTRA test strip USE TO CHECK BLOOD SUGAR TWICE DAILY   oxybutynin (DITROPAN) 5 MG tablet Take 1 tablet (5 mg total) by mouth 2 (two) times daily. (Patient not taking: Reported on 07/12/2022)   No facility-administered encounter medications on file as of 07/12/2022.   ALLERGIES: Allergies  Allergen Reactions   Opana [Oxymorphone Hcl] Itching   Tramadol Itching   VACCINATION STATUS: Immunization History  Administered Date(s) Administered   Fluad Quad(high Dose 65+) 04/16/2022   Influenza, High Dose Seasonal PF 09/12/2018, 04/17/2019, 05/11/2020   Influenza-Unspecified 09/12/2018, 04/01/2019, 04/22/2021   Moderna Sars-Covid-2 Vaccination 09/18/2019, 10/16/2019, 05/24/2020   PPD Test 07/19/2018, 09/07/2018, 11/18/2019   Pneumococcal Polysaccharide-23 09/13/2018, 05/03/2021   Td 03/27/2011    Diabetes He presents for his follow-up diabetic visit. He has type 2 diabetes mellitus. Onset time: He was diagnosed approximately 20 years ago. His disease course has been improving. There are no hypoglycemic associated symptoms. Pertinent negatives for hypoglycemia include no confusion,  headaches, pallor or seizures. Associated symptoms include weight loss. Pertinent negatives for diabetes include no chest pain, no fatigue, no polydipsia, no polyphagia, no polyuria and no weakness. There are no hypoglycemic complications. Symptoms are stable. Diabetic complications include heart disease and nephropathy. (Patient with end-stage renal disease now on hemodialysis.) Risk factors for coronary artery disease include dyslipidemia, diabetes mellitus, hypertension, obesity, sedentary lifestyle, tobacco exposure and male sex. Current diabetic treatment includes insulin injections and oral agent (monotherapy). He is compliant with treatment most of the time. His weight is decreasing steadily. He is following a generally healthy diet. When asked about meal planning, he reported none. He has had a previous visit with a dietitian. He rarely participates in exercise. His home blood glucose trend is fluctuating minimally. His overall blood glucose range is 110-130 mg/dl. (He presents today, accompanied by his family member, with his meter and logs showing at goal glycemic profile.  His previsit A1c checked on 11/14 was 5.9%, decreasing from last visit of 6.3%.  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  Analysis of his meter shows 7-day average of 116, 14-day average of 114, 30-day average of 121.  He denies any hypoglycemia or symptoms of such.) An ACE inhibitor/angiotensin II receptor blocker is being taken. He sees a podiatrist.Eye exam is current.  Hypertension This is a chronic problem. The current episode started more than 1 year ago. The problem has been resolved since onset. The problem is controlled. Pertinent negatives include no chest pain, headaches, neck pain, palpitations or shortness of breath. There are no associated agents to hypertension. Risk factors for coronary artery disease include diabetes mellitus, dyslipidemia, obesity, smoking/tobacco exposure, sedentary lifestyle and  male gender. Past treatments include angiotensin blockers, calcium channel blockers, diuretics and  beta blockers. There are no compliance problems.  Hypertensive end-organ damage includes kidney disease and CAD/MI. Identifiable causes of hypertension include chronic renal disease and sleep apnea.  Hyperlipidemia This is a chronic problem. The current episode started more than 1 year ago. The problem is controlled. Recent lipid tests were reviewed and are normal (LDL normal at 63 triglycerides elevated at 157.). Exacerbating diseases include chronic renal disease, diabetes and obesity. Factors aggravating his hyperlipidemia include fatty foods. Pertinent negatives include no chest pain, myalgias or shortness of breath. Current antihyperlipidemic treatment includes statins. The current treatment provides moderate improvement of lipids. There are no compliance problems.  Risk factors for coronary artery disease include diabetes mellitus, dyslipidemia, hypertension, male sex, a sedentary lifestyle and obesity.    Review of systems  Constitutional: + steadily decreasing body weight,  current Body mass index is 35.74 kg/m. , no fatigue, no subjective hyperthermia, no subjective hypothermia Eyes: no blurry vision, no xerophthalmia ENT: no sore throat, no nodules palpated in throat, no dysphagia/odynophagia, no hoarseness Cardiovascular: no chest pain, no shortness of breath, no palpitations, no leg swelling,  (recently hospitalized for MI and required CABG) Respiratory: no cough, no shortness of breath Gastrointestinal: no nausea/vomiting/diarrhea Musculoskeletal: no muscle/joint aches Skin: no rashes, no hyperemia Neurological: no tremors, no numbness, no tingling, no dizziness Psychiatric: no depression, no anxiety   Objective:    BP 101/69 (BP Location: Right Arm, Patient Position: Sitting, Cuff Size: Large)   Ht '5\' 4"'$  (1.626 m)   Wt 208 lb 3.2 oz (94.4 kg)   BMI 35.74 kg/m   Wt Readings from  Last 3 Encounters:  07/12/22 208 lb 3.2 oz (94.4 kg)  07/04/22 215 lb 12.8 oz (97.9 kg)  06/28/22 222 lb (100.7 kg)    BP Readings from Last 3 Encounters:  07/12/22 101/69  07/04/22 108/68  06/28/22 96/61     Physical Exam- Limited  Constitutional:  Body mass index is 35.74 kg/m. , not in acute distress, normal state of mind Eyes:  EOMI, no exophthalmos Musculoskeletal: no gross deformities, strength intact in all four extremities, no gross restriction of joint movements Skin:  no rashes, no hyperemia Neurological: no tremor with outstretched hands   Diabetic Foot Exam - Simple   Simple Foot Form Visual Inspection No deformities, no ulcerations, no other skin breakdown bilaterally: Yes Sensation Testing Intact to touch and monofilament testing bilaterally: Yes Pulse Check Posterior Tibialis and Dorsalis pulse intact bilaterally: Yes Comments Sees Dr. Caprice Beaver for DM foot care      Results for orders placed or performed during the hospital encounter of 05/30/22  Surgical pcr screen   Specimen: Nasal Mucosa; Nasal Swab  Result Value Ref Range   MRSA, PCR NEGATIVE NEGATIVE   Staphylococcus aureus NEGATIVE NEGATIVE  SARS Coronavirus 2 by RT PCR (hospital order, performed in [facility] hospital lab) *cepheid single result test* Anterior Nasal Swab   Specimen: Anterior Nasal Swab  Result Value Ref Range   SARS Coronavirus 2 by RT PCR NEGATIVE NEGATIVE  Basic metabolic panel  Result Value Ref Range   Sodium 136 135 - 145 mmol/L   Potassium 3.6 3.5 - 5.1 mmol/L   Chloride 96 (L) 98 - 111 mmol/L   CO2 26 22 - 32 mmol/L   Glucose, Bld 128 (H) 70 - 99 mg/dL   BUN 44 (H) 8 - 23 mg/dL   Creatinine, Ser 17.29 (H) 0.61 - 1.24 mg/dL   Calcium 9.9 8.9 - 10.3 mg/dL   GFR, Estimated 3 (  L) >60 mL/min   Anion gap 14 5 - 15  CBC with Differential/Platelet  Result Value Ref Range   WBC 4.4 4.0 - 10.5 K/uL   RBC 2.85 (L) 4.22 - 5.81 MIL/uL   Hemoglobin 8.9 (L) 13.0 - 17.0 g/dL    HCT 26.8 (L) 39.0 - 52.0 %   MCV 94.0 80.0 - 100.0 fL   MCH 31.2 26.0 - 34.0 pg   MCHC 33.2 30.0 - 36.0 g/dL   RDW 15.0 11.5 - 15.5 %   Platelets 126 (L) 150 - 400 K/uL   nRBC 0.0 0.0 - 0.2 %   Neutrophils Relative % 75 %   Neutro Abs 3.3 1.7 - 7.7 K/uL   Lymphocytes Relative 11 %   Lymphs Abs 0.5 (L) 0.7 - 4.0 K/uL   Monocytes Relative 9 %   Monocytes Absolute 0.4 0.1 - 1.0 K/uL   Eosinophils Relative 3 %   Eosinophils Absolute 0.1 0.0 - 0.5 K/uL   Basophils Relative 1 %   Basophils Absolute 0.0 0.0 - 0.1 K/uL   Immature Granulocytes 1 %   Abs Immature Granulocytes 0.02 0.00 - 0.07 K/uL  Magnesium  Result Value Ref Range   Magnesium 2.1 1.7 - 2.4 mg/dL  Hemoglobin A1c  Result Value Ref Range   Hgb A1c MFr Bld 5.9 (H) 4.8 - 5.6 %   Mean Plasma Glucose 122.63 mg/dL  TSH  Result Value Ref Range   TSH 2.033 0.350 - 4.500 uIU/mL  Hepatic function panel  Result Value Ref Range   Total Protein 6.4 (L) 6.5 - 8.1 g/dL   Albumin 3.0 (L) 3.5 - 5.0 g/dL   AST 14 (L) 15 - 41 U/L   ALT 13 0 - 44 U/L   Alkaline Phosphatase 68 38 - 126 U/L   Total Bilirubin 0.3 0.3 - 1.2 mg/dL   Bilirubin, Direct <0.1 0.0 - 0.2 mg/dL   Indirect Bilirubin NOT CALCULATED 0.3 - 0.9 mg/dL  Glucose, capillary  Result Value Ref Range   Glucose-Capillary 88 70 - 99 mg/dL  CBC  Result Value Ref Range   WBC 5.3 4.0 - 10.5 K/uL   RBC 3.10 (L) 4.22 - 5.81 MIL/uL   Hemoglobin 9.5 (L) 13.0 - 17.0 g/dL   HCT 28.8 (L) 39.0 - 52.0 %   MCV 92.9 80.0 - 100.0 fL   MCH 30.6 26.0 - 34.0 pg   MCHC 33.0 30.0 - 36.0 g/dL   RDW 14.9 11.5 - 15.5 %   Platelets 115 (L) 150 - 400 K/uL   nRBC 0.0 0.0 - 0.2 %  Basic metabolic panel  Result Value Ref Range   Sodium 136 135 - 145 mmol/L   Potassium 4.0 3.5 - 5.1 mmol/L   Chloride 98 98 - 111 mmol/L   CO2 27 22 - 32 mmol/L   Glucose, Bld 101 (H) 70 - 99 mg/dL   BUN 48 (H) 8 - 23 mg/dL   Creatinine, Ser 19.27 (H) 0.61 - 1.24 mg/dL   Calcium 9.4 8.9 - 10.3 mg/dL    GFR, Estimated 2 (L) >60 mL/min   Anion gap 11 5 - 15  Glucose, capillary  Result Value Ref Range   Glucose-Capillary 91 70 - 99 mg/dL  Glucose, capillary  Result Value Ref Range   Glucose-Capillary 99 70 - 99 mg/dL  Glucose, capillary  Result Value Ref Range   Glucose-Capillary 116 (H) 70 - 99 mg/dL  Heparin level (unfractionated)  Result Value Ref Range   Heparin  Unfractionated 0.19 (L) 0.30 - 0.70 IU/mL  Glucose, capillary  Result Value Ref Range   Glucose-Capillary 98 70 - 99 mg/dL  CBC with Differential/Platelet  Result Value Ref Range   WBC 5.6 4.0 - 10.5 K/uL   RBC 2.98 (L) 4.22 - 5.81 MIL/uL   Hemoglobin 9.0 (L) 13.0 - 17.0 g/dL   HCT 27.8 (L) 39.0 - 52.0 %   MCV 93.3 80.0 - 100.0 fL   MCH 30.2 26.0 - 34.0 pg   MCHC 32.4 30.0 - 36.0 g/dL   RDW 14.9 11.5 - 15.5 %   Platelets 105 (L) 150 - 400 K/uL   nRBC 0.0 0.0 - 0.2 %   Neutrophils Relative % 78 %   Neutro Abs 4.4 1.7 - 7.7 K/uL   Lymphocytes Relative 8 %   Lymphs Abs 0.4 (L) 0.7 - 4.0 K/uL   Monocytes Relative 10 %   Monocytes Absolute 0.6 0.1 - 1.0 K/uL   Eosinophils Relative 3 %   Eosinophils Absolute 0.2 0.0 - 0.5 K/uL   Basophils Relative 1 %   Basophils Absolute 0.0 0.0 - 0.1 K/uL   Immature Granulocytes 0 %   Abs Immature Granulocytes 0.02 0.00 - 0.07 K/uL  Comprehensive metabolic panel  Result Value Ref Range   Sodium 136 135 - 145 mmol/L   Potassium 3.9 3.5 - 5.1 mmol/L   Chloride 96 (L) 98 - 111 mmol/L   CO2 26 22 - 32 mmol/L   Glucose, Bld 121 (H) 70 - 99 mg/dL   BUN 50 (H) 8 - 23 mg/dL   Creatinine, Ser 19.46 (H) 0.61 - 1.24 mg/dL   Calcium 9.3 8.9 - 10.3 mg/dL   Total Protein 5.8 (L) 6.5 - 8.1 g/dL   Albumin 2.6 (L) 3.5 - 5.0 g/dL   AST 32 15 - 41 U/L   ALT 13 0 - 44 U/L   Alkaline Phosphatase 46 38 - 126 U/L   Total Bilirubin 0.4 0.3 - 1.2 mg/dL   GFR, Estimated 2 (L) >60 mL/min   Anion gap 14 5 - 15  Magnesium  Result Value Ref Range   Magnesium 2.0 1.7 - 2.4 mg/dL  Phosphorus   Result Value Ref Range   Phosphorus 3.3 2.5 - 4.6 mg/dL  Glucose, capillary  Result Value Ref Range   Glucose-Capillary 148 (H) 70 - 99 mg/dL  Glucose, capillary  Result Value Ref Range   Glucose-Capillary 127 (H) 70 - 99 mg/dL  Glucose, capillary  Result Value Ref Range   Glucose-Capillary 102 (H) 70 - 99 mg/dL  Heparin level (unfractionated)  Result Value Ref Range   Heparin Unfractionated 0.20 (L) 0.30 - 0.70 IU/mL  Glucose, capillary  Result Value Ref Range   Glucose-Capillary 90 70 - 99 mg/dL  Glucose, capillary  Result Value Ref Range   Glucose-Capillary 130 (H) 70 - 99 mg/dL  Glucose, capillary  Result Value Ref Range   Glucose-Capillary 80 70 - 99 mg/dL  Heparin level (unfractionated)  Result Value Ref Range   Heparin Unfractionated 0.32 0.30 - 0.70 IU/mL  Iron and TIBC  Result Value Ref Range   Iron 79 45 - 182 ug/dL   TIBC 293 250 - 450 ug/dL   Saturation Ratios 27 17.9 - 39.5 %   UIBC 214 ug/dL  Ferritin  Result Value Ref Range   Ferritin 1,105 (H) 24 - 336 ng/mL  CBC with Differential/Platelet  Result Value Ref Range   WBC 4.9 4.0 - 10.5 K/uL   RBC  2.85 (L) 4.22 - 5.81 MIL/uL   Hemoglobin 8.5 (L) 13.0 - 17.0 g/dL   HCT 27.2 (L) 39.0 - 52.0 %   MCV 95.4 80.0 - 100.0 fL   MCH 29.8 26.0 - 34.0 pg   MCHC 31.3 30.0 - 36.0 g/dL   RDW 14.9 11.5 - 15.5 %   Platelets 106 (L) 150 - 400 K/uL   nRBC 0.0 0.0 - 0.2 %   Neutrophils Relative % 77 %   Neutro Abs 3.8 1.7 - 7.7 K/uL   Lymphocytes Relative 11 %   Lymphs Abs 0.5 (L) 0.7 - 4.0 K/uL   Monocytes Relative 8 %   Monocytes Absolute 0.4 0.1 - 1.0 K/uL   Eosinophils Relative 3 %   Eosinophils Absolute 0.2 0.0 - 0.5 K/uL   Basophils Relative 0 %   Basophils Absolute 0.0 0.0 - 0.1 K/uL   Immature Granulocytes 1 %   Abs Immature Granulocytes 0.03 0.00 - 0.07 K/uL  Comprehensive metabolic panel  Result Value Ref Range   Sodium 134 (L) 135 - 145 mmol/L   Potassium 3.9 3.5 - 5.1 mmol/L   Chloride 94 (L) 98 -  111 mmol/L   CO2 26 22 - 32 mmol/L   Glucose, Bld 132 (H) 70 - 99 mg/dL   BUN 47 (H) 8 - 23 mg/dL   Creatinine, Ser 19.06 (H) 0.61 - 1.24 mg/dL   Calcium 9.0 8.9 - 10.3 mg/dL   Total Protein 5.9 (L) 6.5 - 8.1 g/dL   Albumin 2.7 (L) 3.5 - 5.0 g/dL   AST 24 15 - 41 U/L   ALT 14 0 - 44 U/L   Alkaline Phosphatase 42 38 - 126 U/L   Total Bilirubin 0.4 0.3 - 1.2 mg/dL   GFR, Estimated 2 (L) >60 mL/min   Anion gap 14 5 - 15  Magnesium  Result Value Ref Range   Magnesium 2.0 1.7 - 2.4 mg/dL  Phosphorus  Result Value Ref Range   Phosphorus 3.3 2.5 - 4.6 mg/dL  Glucose, capillary  Result Value Ref Range   Glucose-Capillary 129 (H) 70 - 99 mg/dL  Glucose, capillary  Result Value Ref Range   Glucose-Capillary 111 (H) 70 - 99 mg/dL  Glucose, capillary  Result Value Ref Range   Glucose-Capillary 130 (H) 70 - 99 mg/dL  Glucose, capillary  Result Value Ref Range   Glucose-Capillary 101 (H) 70 - 99 mg/dL  Lipid panel  Result Value Ref Range   Cholesterol 88 0 - 200 mg/dL   Triglycerides 98 <150 mg/dL   HDL 32 (L) >40 mg/dL   Total CHOL/HDL Ratio 2.8 RATIO   VLDL 20 0 - 40 mg/dL   LDL Cholesterol 36 0 - 99 mg/dL  Glucose, capillary  Result Value Ref Range   Glucose-Capillary 139 (H) 70 - 99 mg/dL  CBC with Differential/Platelet  Result Value Ref Range   WBC 5.1 4.0 - 10.5 K/uL   RBC 2.66 (L) 4.22 - 5.81 MIL/uL   Hemoglobin 8.3 (L) 13.0 - 17.0 g/dL   HCT 25.0 (L) 39.0 - 52.0 %   MCV 94.0 80.0 - 100.0 fL   MCH 31.2 26.0 - 34.0 pg   MCHC 33.2 30.0 - 36.0 g/dL   RDW 14.9 11.5 - 15.5 %   Platelets 90 (L) 150 - 400 K/uL   nRBC 0.4 (H) 0.0 - 0.2 %   Neutrophils Relative % 77 %   Neutro Abs 4.0 1.7 - 7.7 K/uL   Lymphocytes Relative 10 %  Lymphs Abs 0.5 (L) 0.7 - 4.0 K/uL   Monocytes Relative 9 %   Monocytes Absolute 0.5 0.1 - 1.0 K/uL   Eosinophils Relative 3 %   Eosinophils Absolute 0.1 0.0 - 0.5 K/uL   Basophils Relative 1 %   Basophils Absolute 0.0 0.0 - 0.1 K/uL   Immature  Granulocytes 0 %   Abs Immature Granulocytes 0.02 0.00 - 0.07 K/uL  Comprehensive metabolic panel  Result Value Ref Range   Sodium 137 135 - 145 mmol/L   Potassium 4.1 3.5 - 5.1 mmol/L   Chloride 98 98 - 111 mmol/L   CO2 25 22 - 32 mmol/L   Glucose, Bld 127 (H) 70 - 99 mg/dL   BUN 50 (H) 8 - 23 mg/dL   Creatinine, Ser 20.40 (H) 0.61 - 1.24 mg/dL   Calcium 9.2 8.9 - 10.3 mg/dL   Total Protein 5.9 (L) 6.5 - 8.1 g/dL   Albumin 2.7 (L) 3.5 - 5.0 g/dL   AST 20 15 - 41 U/L   ALT 14 0 - 44 U/L   Alkaline Phosphatase 35 (L) 38 - 126 U/L   Total Bilirubin 0.3 0.3 - 1.2 mg/dL   GFR, Estimated 2 (L) >60 mL/min   Anion gap 14 5 - 15  Magnesium  Result Value Ref Range   Magnesium 2.1 1.7 - 2.4 mg/dL  Phosphorus  Result Value Ref Range   Phosphorus 4.0 2.5 - 4.6 mg/dL  Heparin level (unfractionated)  Result Value Ref Range   Heparin Unfractionated 0.29 (L) 0.30 - 0.70 IU/mL  Glucose, capillary  Result Value Ref Range   Glucose-Capillary 113 (H) 70 - 99 mg/dL  Glucose, capillary  Result Value Ref Range   Glucose-Capillary 128 (H) 70 - 99 mg/dL  Glucose, capillary  Result Value Ref Range   Glucose-Capillary 119 (H) 70 - 99 mg/dL  CBC  Result Value Ref Range   WBC 4.2 4.0 - 10.5 K/uL   RBC 2.71 (L) 4.22 - 5.81 MIL/uL   Hemoglobin 8.3 (L) 13.0 - 17.0 g/dL   HCT 25.8 (L) 39.0 - 52.0 %   MCV 95.2 80.0 - 100.0 fL   MCH 30.6 26.0 - 34.0 pg   MCHC 32.2 30.0 - 36.0 g/dL   RDW 14.9 11.5 - 15.5 %   Platelets 92 (L) 150 - 400 K/uL   nRBC 0.0 0.0 - 0.2 %  Heparin level (unfractionated)  Result Value Ref Range   Heparin Unfractionated 0.47 0.30 - 0.70 IU/mL  Glucose, capillary  Result Value Ref Range   Glucose-Capillary 163 (H) 70 - 99 mg/dL  Glucose, capillary  Result Value Ref Range   Glucose-Capillary 97 70 - 99 mg/dL  Glucose, capillary  Result Value Ref Range   Glucose-Capillary 130 (H) 70 - 99 mg/dL  Glucose, capillary  Result Value Ref Range   Glucose-Capillary 92 70 - 99  mg/dL  Heparin level (unfractionated)  Result Value Ref Range   Heparin Unfractionated 0.50 0.30 - 0.70 IU/mL  Renal function panel  Result Value Ref Range   Sodium 138 135 - 145 mmol/L   Potassium 4.3 3.5 - 5.1 mmol/L   Chloride 98 98 - 111 mmol/L   CO2 27 22 - 32 mmol/L   Glucose, Bld 143 (H) 70 - 99 mg/dL   BUN 52 (H) 8 - 23 mg/dL   Creatinine, Ser 20.83 (H) 0.61 - 1.24 mg/dL   Calcium 9.1 8.9 - 10.3 mg/dL   Phosphorus 3.6 2.5 - 4.6 mg/dL  Albumin 2.7 (L) 3.5 - 5.0 g/dL   GFR, Estimated 2 (L) >60 mL/min   Anion gap 13 5 - 15  Protime-INR  Result Value Ref Range   Prothrombin Time 13.2 11.4 - 15.2 seconds   INR 1.0 0.8 - 1.2  APTT  Result Value Ref Range   aPTT 88 (H) 24 - 36 seconds  Glucose, capillary  Result Value Ref Range   Glucose-Capillary 150 (H) 70 - 99 mg/dL  CBC  Result Value Ref Range   WBC 4.6 4.0 - 10.5 K/uL   RBC 2.76 (L) 4.22 - 5.81 MIL/uL   Hemoglobin 8.3 (L) 13.0 - 17.0 g/dL   HCT 26.8 (L) 39.0 - 52.0 %   MCV 97.1 80.0 - 100.0 fL   MCH 30.1 26.0 - 34.0 pg   MCHC 31.0 30.0 - 36.0 g/dL   RDW 15.2 11.5 - 15.5 %   Platelets 123 (L) 150 - 400 K/uL   nRBC 0.4 (H) 0.0 - 0.2 %  Glucose, capillary  Result Value Ref Range   Glucose-Capillary 140 (H) 70 - 99 mg/dL  CBC  Result Value Ref Range   WBC 7.5 4.0 - 10.5 K/uL   RBC 2.67 (L) 4.22 - 5.81 MIL/uL   Hemoglobin 8.0 (L) 13.0 - 17.0 g/dL   HCT 25.4 (L) 39.0 - 52.0 %   MCV 95.1 80.0 - 100.0 fL   MCH 30.0 26.0 - 34.0 pg   MCHC 31.5 30.0 - 36.0 g/dL   RDW 16.2 (H) 11.5 - 15.5 %   Platelets 105 (L) 150 - 400 K/uL   nRBC 0.4 (H) 0.0 - 0.2 %  Protime-INR  Result Value Ref Range   Prothrombin Time 15.2 11.4 - 15.2 seconds   INR 1.2 0.8 - 1.2  APTT  Result Value Ref Range   aPTT 35 24 - 36 seconds  Glucose, capillary  Result Value Ref Range   Glucose-Capillary 118 (H) 70 - 99 mg/dL  Glucose, capillary  Result Value Ref Range   Glucose-Capillary 117 (H) 70 - 99 mg/dL  Glucose, capillary  Result  Value Ref Range   Glucose-Capillary 127 (H) 70 - 99 mg/dL  Glucose, capillary  Result Value Ref Range   Glucose-Capillary 142 (H) 70 - 99 mg/dL  CBC  Result Value Ref Range   WBC 7.3 4.0 - 10.5 K/uL   RBC 2.63 (L) 4.22 - 5.81 MIL/uL   Hemoglobin 8.1 (L) 13.0 - 17.0 g/dL   HCT 24.7 (L) 39.0 - 52.0 %   MCV 93.9 80.0 - 100.0 fL   MCH 30.8 26.0 - 34.0 pg   MCHC 32.8 30.0 - 36.0 g/dL   RDW 16.5 (H) 11.5 - 15.5 %   Platelets 104 (L) 150 - 400 K/uL   nRBC 0.6 (H) 0.0 - 0.2 %  Basic metabolic panel  Result Value Ref Range   Sodium 135 135 - 145 mmol/L   Potassium 6.1 (H) 3.5 - 5.1 mmol/L   Chloride 100 98 - 111 mmol/L   CO2 21 (L) 22 - 32 mmol/L   Glucose, Bld 132 (H) 70 - 99 mg/dL   BUN 49 (H) 8 - 23 mg/dL   Creatinine, Ser 19.02 (H) 0.61 - 1.24 mg/dL   Calcium 8.0 (L) 8.9 - 10.3 mg/dL   GFR, Estimated 2 (L) >60 mL/min   Anion gap 14 5 - 15  Magnesium  Result Value Ref Range   Magnesium 2.0 1.7 - 2.4 mg/dL  Glucose, capillary  Result Value Ref  Range   Glucose-Capillary 124 (H) 70 - 99 mg/dL  Glucose, capillary  Result Value Ref Range   Glucose-Capillary 147 (H) 70 - 99 mg/dL  CBC  Result Value Ref Range   WBC 7.1 4.0 - 10.5 K/uL   RBC 2.61 (L) 4.22 - 5.81 MIL/uL   Hemoglobin 8.0 (L) 13.0 - 17.0 g/dL   HCT 24.5 (L) 39.0 - 52.0 %   MCV 93.9 80.0 - 100.0 fL   MCH 30.7 26.0 - 34.0 pg   MCHC 32.7 30.0 - 36.0 g/dL   RDW 16.4 (H) 11.5 - 15.5 %   Platelets 98 (L) 150 - 400 K/uL   nRBC 0.0 0.0 - 0.2 %  Basic metabolic panel  Result Value Ref Range   Sodium 137 135 - 145 mmol/L   Potassium 5.3 (H) 3.5 - 5.1 mmol/L   Chloride 100 98 - 111 mmol/L   CO2 22 22 - 32 mmol/L   Glucose, Bld 148 (H) 70 - 99 mg/dL   BUN 48 (H) 8 - 23 mg/dL   Creatinine, Ser 19.52 (H) 0.61 - 1.24 mg/dL   Calcium 7.9 (L) 8.9 - 10.3 mg/dL   GFR, Estimated 2 (L) >60 mL/min   Anion gap 15 5 - 15  Magnesium  Result Value Ref Range   Magnesium 1.9 1.7 - 2.4 mg/dL  Glucose, capillary  Result Value Ref  Range   Glucose-Capillary 130 (H) 70 - 99 mg/dL  Glucose, capillary  Result Value Ref Range   Glucose-Capillary 165 (H) 70 - 99 mg/dL  Glucose, capillary  Result Value Ref Range   Glucose-Capillary 150 (H) 70 - 99 mg/dL  Glucose, capillary  Result Value Ref Range   Glucose-Capillary 131 (H) 70 - 99 mg/dL  CBC  Result Value Ref Range   WBC 10.4 4.0 - 10.5 K/uL   RBC 2.60 (L) 4.22 - 5.81 MIL/uL   Hemoglobin 7.9 (L) 13.0 - 17.0 g/dL   HCT 24.7 (L) 39.0 - 52.0 %   MCV 95.0 80.0 - 100.0 fL   MCH 30.4 26.0 - 34.0 pg   MCHC 32.0 30.0 - 36.0 g/dL   RDW 16.7 (H) 11.5 - 15.5 %   Platelets 133 (L) 150 - 400 K/uL   nRBC 0.6 (H) 0.0 - 0.2 %  Glucose, capillary  Result Value Ref Range   Glucose-Capillary 158 (H) 70 - 99 mg/dL  Glucose, capillary  Result Value Ref Range   Glucose-Capillary 132 (H) 70 - 99 mg/dL  Glucose, capillary  Result Value Ref Range   Glucose-Capillary 155 (H) 70 - 99 mg/dL  Glucose, capillary  Result Value Ref Range   Glucose-Capillary 96 70 - 99 mg/dL  Glucose, capillary  Result Value Ref Range   Glucose-Capillary 134 (H) 70 - 99 mg/dL  Glucose, capillary  Result Value Ref Range   Glucose-Capillary 145 (H) 70 - 99 mg/dL  Glucose, capillary  Result Value Ref Range   Glucose-Capillary 109 (H) 70 - 99 mg/dL  Renal function panel (daily at 1600)  Result Value Ref Range   Sodium 136 135 - 145 mmol/L   Potassium 5.2 (H) 3.5 - 5.1 mmol/L   Chloride 101 98 - 111 mmol/L   CO2 22 22 - 32 mmol/L   Glucose, Bld 129 (H) 70 - 99 mg/dL   BUN 47 (H) 8 - 23 mg/dL   Creatinine, Ser 16.63 (H) 0.61 - 1.24 mg/dL   Calcium 7.7 (L) 8.9 - 10.3 mg/dL   Phosphorus 4.6 2.5 - 4.6  mg/dL   Albumin 2.7 (L) 3.5 - 5.0 g/dL   GFR, Estimated 3 (L) >60 mL/min   Anion gap 13 5 - 15  Glucose, capillary  Result Value Ref Range   Glucose-Capillary 122 (H) 70 - 99 mg/dL  Glucose, capillary  Result Value Ref Range   Glucose-Capillary 178 (H) 70 - 99 mg/dL  CBC  Result Value Ref Range    WBC 8.7 4.0 - 10.5 K/uL   RBC 2.48 (L) 4.22 - 5.81 MIL/uL   Hemoglobin 7.5 (L) 13.0 - 17.0 g/dL   HCT 24.0 (L) 39.0 - 52.0 %   MCV 96.8 80.0 - 100.0 fL   MCH 30.2 26.0 - 34.0 pg   MCHC 31.3 30.0 - 36.0 g/dL   RDW 16.5 (H) 11.5 - 15.5 %   Platelets 108 (L) 150 - 400 K/uL   nRBC 0.3 (H) 0.0 - 0.2 %  Basic metabolic panel  Result Value Ref Range   Sodium 135 135 - 145 mmol/L   Potassium 5.2 (H) 3.5 - 5.1 mmol/L   Chloride 101 98 - 111 mmol/L   CO2 22 22 - 32 mmol/L   Glucose, Bld 130 (H) 70 - 99 mg/dL   BUN 47 (H) 8 - 23 mg/dL   Creatinine, Ser 16.95 (H) 0.61 - 1.24 mg/dL   Calcium 7.6 (L) 8.9 - 10.3 mg/dL   GFR, Estimated 3 (L) >60 mL/min   Anion gap 12 5 - 15  Magnesium  Result Value Ref Range   Magnesium 2.0 1.7 - 2.4 mg/dL  Glucose, capillary  Result Value Ref Range   Glucose-Capillary 164 (H) 70 - 99 mg/dL  Glucose, capillary  Result Value Ref Range   Glucose-Capillary 163 (H) 70 - 99 mg/dL  Glucose, capillary  Result Value Ref Range   Glucose-Capillary 126 (H) 70 - 99 mg/dL  Glucose, capillary  Result Value Ref Range   Glucose-Capillary 124 (H) 70 - 99 mg/dL  Glucose, capillary  Result Value Ref Range   Glucose-Capillary 133 (H) 70 - 99 mg/dL  Renal function panel (daily at 0500)  Result Value Ref Range   Sodium 137 135 - 145 mmol/L   Potassium 4.2 3.5 - 5.1 mmol/L   Chloride 102 98 - 111 mmol/L   CO2 20 (L) 22 - 32 mmol/L   Glucose, Bld 134 (H) 70 - 99 mg/dL   BUN 30 (H) 8 - 23 mg/dL   Creatinine, Ser 11.40 (H) 0.61 - 1.24 mg/dL   Calcium 7.8 (L) 8.9 - 10.3 mg/dL   Phosphorus 3.7 2.5 - 4.6 mg/dL   Albumin 2.7 (L) 3.5 - 5.0 g/dL   GFR, Estimated 4 (L) >60 mL/min   Anion gap 15 5 - 15  Magnesium  Result Value Ref Range   Magnesium 2.0 1.7 - 2.4 mg/dL  CBC  Result Value Ref Range   WBC 9.3 4.0 - 10.5 K/uL   RBC 2.65 (L) 4.22 - 5.81 MIL/uL   Hemoglobin 8.3 (L) 13.0 - 17.0 g/dL   HCT 25.0 (L) 39.0 - 52.0 %   MCV 94.3 80.0 - 100.0 fL   MCH 31.3 26.0 -  34.0 pg   MCHC 33.2 30.0 - 36.0 g/dL   RDW 16.5 (H) 11.5 - 15.5 %   Platelets 134 (L) 150 - 400 K/uL   nRBC 0.4 (H) 0.0 - 0.2 %  Glucose, capillary  Result Value Ref Range   Glucose-Capillary 124 (H) 70 - 99 mg/dL  Glucose, capillary  Result Value Ref Range  Glucose-Capillary 110 (H) 70 - 99 mg/dL  Glucose, capillary  Result Value Ref Range   Glucose-Capillary 110 (H) 70 - 99 mg/dL  Renal function panel (daily at 1600)  Result Value Ref Range   Sodium 137 135 - 145 mmol/L   Potassium 4.5 3.5 - 5.1 mmol/L   Chloride 101 98 - 111 mmol/L   CO2 23 22 - 32 mmol/L   Glucose, Bld 133 (H) 70 - 99 mg/dL   BUN 23 8 - 23 mg/dL   Creatinine, Ser 8.11 (H) 0.61 - 1.24 mg/dL   Calcium 8.3 (L) 8.9 - 10.3 mg/dL   Phosphorus 3.6 2.5 - 4.6 mg/dL   Albumin 2.7 (L) 3.5 - 5.0 g/dL   GFR, Estimated 6 (L) >60 mL/min   Anion gap 13 5 - 15  Glucose, capillary  Result Value Ref Range   Glucose-Capillary 107 (H) 70 - 99 mg/dL  Glucose, capillary  Result Value Ref Range   Glucose-Capillary 138 (H) 70 - 99 mg/dL  Glucose, capillary  Result Value Ref Range   Glucose-Capillary 127 (H) 70 - 99 mg/dL  Glucose, capillary  Result Value Ref Range   Glucose-Capillary 113 (H) 70 - 99 mg/dL  Renal function panel (daily at 0500)  Result Value Ref Range   Sodium 139 135 - 145 mmol/L   Potassium 4.5 3.5 - 5.1 mmol/L   Chloride 103 98 - 111 mmol/L   CO2 24 22 - 32 mmol/L   Glucose, Bld 126 (H) 70 - 99 mg/dL   BUN 19 8 - 23 mg/dL   Creatinine, Ser 6.08 (H) 0.61 - 1.24 mg/dL   Calcium 8.6 (L) 8.9 - 10.3 mg/dL   Phosphorus 2.8 2.5 - 4.6 mg/dL   Albumin 2.5 (L) 3.5 - 5.0 g/dL   GFR, Estimated 9 (L) >60 mL/min   Anion gap 12 5 - 15  Magnesium  Result Value Ref Range   Magnesium 2.4 1.7 - 2.4 mg/dL  Glucose, capillary  Result Value Ref Range   Glucose-Capillary 112 (H) 70 - 99 mg/dL  Glucose, capillary  Result Value Ref Range   Glucose-Capillary 118 (H) 70 - 99 mg/dL  Renal function panel (daily at  1600)  Result Value Ref Range   Sodium 138 135 - 145 mmol/L   Potassium 4.3 3.5 - 5.1 mmol/L   Chloride 102 98 - 111 mmol/L   CO2 24 22 - 32 mmol/L   Glucose, Bld 129 (H) 70 - 99 mg/dL   BUN 18 8 - 23 mg/dL   Creatinine, Ser 5.10 (H) 0.61 - 1.24 mg/dL   Calcium 8.5 (L) 8.9 - 10.3 mg/dL   Phosphorus 2.5 2.5 - 4.6 mg/dL   Albumin 2.4 (L) 3.5 - 5.0 g/dL   GFR, Estimated 11 (L) >60 mL/min   Anion gap 12 5 - 15  CBC  Result Value Ref Range   WBC 5.1 4.0 - 10.5 K/uL   RBC 2.59 (L) 4.22 - 5.81 MIL/uL   Hemoglobin 7.8 (L) 13.0 - 17.0 g/dL   HCT 25.3 (L) 39.0 - 52.0 %   MCV 97.7 80.0 - 100.0 fL   MCH 30.1 26.0 - 34.0 pg   MCHC 30.8 30.0 - 36.0 g/dL   RDW 16.3 (H) 11.5 - 15.5 %   Platelets 155 150 - 400 K/uL   nRBC 1.0 (H) 0.0 - 0.2 %  Glucose, capillary  Result Value Ref Range   Glucose-Capillary 118 (H) 70 - 99 mg/dL  Glucose, capillary  Result Value Ref  Range   Glucose-Capillary 146 (H) 70 - 99 mg/dL  Glucose, capillary  Result Value Ref Range   Glucose-Capillary 136 (H) 70 - 99 mg/dL  Glucose, capillary  Result Value Ref Range   Glucose-Capillary 126 (H) 70 - 99 mg/dL  Renal function panel (daily at 0500)  Result Value Ref Range   Sodium 140 135 - 145 mmol/L   Potassium 4.1 3.5 - 5.1 mmol/L   Chloride 102 98 - 111 mmol/L   CO2 26 22 - 32 mmol/L   Glucose, Bld 105 (H) 70 - 99 mg/dL   BUN 15 8 - 23 mg/dL   Creatinine, Ser 4.30 (H) 0.61 - 1.24 mg/dL   Calcium 8.5 (L) 8.9 - 10.3 mg/dL   Phosphorus 2.2 (L) 2.5 - 4.6 mg/dL   Albumin 2.2 (L) 3.5 - 5.0 g/dL   GFR, Estimated 14 (L) >60 mL/min   Anion gap 12 5 - 15  CBC  Result Value Ref Range   WBC 5.4 4.0 - 10.5 K/uL   RBC 2.40 (L) 4.22 - 5.81 MIL/uL   Hemoglobin 7.2 (L) 13.0 - 17.0 g/dL   HCT 23.7 (L) 39.0 - 52.0 %   MCV 98.8 80.0 - 100.0 fL   MCH 30.0 26.0 - 34.0 pg   MCHC 30.4 30.0 - 36.0 g/dL   RDW 16.2 (H) 11.5 - 15.5 %   Platelets 163 150 - 400 K/uL   nRBC 1.3 (H) 0.0 - 0.2 %  Glucose, capillary  Result Value  Ref Range   Glucose-Capillary 91 70 - 99 mg/dL  Renal function panel (daily at 1600)  Result Value Ref Range   Sodium 139 135 - 145 mmol/L   Potassium 4.1 3.5 - 5.1 mmol/L   Chloride 103 98 - 111 mmol/L   CO2 24 22 - 32 mmol/L   Glucose, Bld 109 (H) 70 - 99 mg/dL   BUN 15 8 - 23 mg/dL   Creatinine, Ser 3.67 (H) 0.61 - 1.24 mg/dL   Calcium 8.0 (L) 8.9 - 10.3 mg/dL   Phosphorus 4.3 2.5 - 4.6 mg/dL   Albumin 2.3 (L) 3.5 - 5.0 g/dL   GFR, Estimated 17 (L) >60 mL/min   Anion gap 12 5 - 15  Magnesium  Result Value Ref Range   Magnesium 2.4 1.7 - 2.4 mg/dL  Glucose, capillary  Result Value Ref Range   Glucose-Capillary 110 (H) 70 - 99 mg/dL  Glucose, capillary  Result Value Ref Range   Glucose-Capillary 120 (H) 70 - 99 mg/dL  CBC  Result Value Ref Range   WBC 5.6 4.0 - 10.5 K/uL   RBC 2.70 (L) 4.22 - 5.81 MIL/uL   Hemoglobin 8.3 (L) 13.0 - 17.0 g/dL   HCT 25.8 (L) 39.0 - 52.0 %   MCV 95.6 80.0 - 100.0 fL   MCH 30.7 26.0 - 34.0 pg   MCHC 32.2 30.0 - 36.0 g/dL   RDW 16.0 (H) 11.5 - 15.5 %   Platelets 153 150 - 400 K/uL   nRBC 2.5 (H) 0.0 - 0.2 %  Glucose, capillary  Result Value Ref Range   Glucose-Capillary 97 70 - 99 mg/dL  Magnesium  Result Value Ref Range   Magnesium 2.5 (H) 1.7 - 2.4 mg/dL  CBC  Result Value Ref Range   WBC 4.7 4.0 - 10.5 K/uL   RBC 2.65 (L) 4.22 - 5.81 MIL/uL   Hemoglobin 8.0 (L) 13.0 - 17.0 g/dL   HCT 25.6 (L) 39.0 - 52.0 %   MCV  96.6 80.0 - 100.0 fL   MCH 30.2 26.0 - 34.0 pg   MCHC 31.3 30.0 - 36.0 g/dL   RDW 16.3 (H) 11.5 - 15.5 %   Platelets 145 (L) 150 - 400 K/uL   nRBC 4.6 (H) 0.0 - 0.2 %  Renal function panel (daily at 1600)  Result Value Ref Range   Sodium 136 135 - 145 mmol/L   Potassium 4.0 3.5 - 5.1 mmol/L   Chloride 104 98 - 111 mmol/L   CO2 26 22 - 32 mmol/L   Glucose, Bld 132 (H) 70 - 99 mg/dL   BUN 11 8 - 23 mg/dL   Creatinine, Ser 2.99 (H) 0.61 - 1.24 mg/dL   Calcium 8.0 (L) 8.9 - 10.3 mg/dL   Phosphorus 2.7 2.5 - 4.6  mg/dL   Albumin 2.2 (L) 3.5 - 5.0 g/dL   GFR, Estimated 21 (L) >60 mL/min   Anion gap 6 5 - 15  Glucose, capillary  Result Value Ref Range   Glucose-Capillary 97 70 - 99 mg/dL  Renal function panel  Result Value Ref Range   Sodium 136 135 - 145 mmol/L   Potassium 4.1 3.5 - 5.1 mmol/L   Chloride 101 98 - 111 mmol/L   CO2 25 22 - 32 mmol/L   Glucose, Bld 98 70 - 99 mg/dL   BUN 12 8 - 23 mg/dL   Creatinine, Ser 3.09 (H) 0.61 - 1.24 mg/dL   Calcium 8.0 (L) 8.9 - 10.3 mg/dL   Phosphorus 2.4 (L) 2.5 - 4.6 mg/dL   Albumin 2.2 (L) 3.5 - 5.0 g/dL   GFR, Estimated 21 (L) >60 mL/min   Anion gap 10 5 - 15  Pathologist smear review  Result Value Ref Range   Path Review      Normocytic, normochromic anemia. Increased nRBC's. Correlate with history.  Glucose, capillary  Result Value Ref Range   Glucose-Capillary 102 (H) 70 - 99 mg/dL  Glucose, capillary  Result Value Ref Range   Glucose-Capillary 107 (H) 70 - 99 mg/dL  Glucose, capillary  Result Value Ref Range   Glucose-Capillary 95 70 - 99 mg/dL  Renal function panel (daily at 0500)  Result Value Ref Range   Sodium 136 135 - 145 mmol/L   Potassium 4.0 3.5 - 5.1 mmol/L   Chloride 102 98 - 111 mmol/L   CO2 26 22 - 32 mmol/L   Glucose, Bld 117 (H) 70 - 99 mg/dL   BUN 10 8 - 23 mg/dL   Creatinine, Ser 2.98 (H) 0.61 - 1.24 mg/dL   Calcium 7.9 (L) 8.9 - 10.3 mg/dL   Phosphorus 2.4 (L) 2.5 - 4.6 mg/dL   Albumin 2.2 (L) 3.5 - 5.0 g/dL   GFR, Estimated 22 (L) >60 mL/min   Anion gap 8 5 - 15  CBC  Result Value Ref Range   WBC 6.4 4.0 - 10.5 K/uL   RBC 2.63 (L) 4.22 - 5.81 MIL/uL   Hemoglobin 8.0 (L) 13.0 - 17.0 g/dL   HCT 25.5 (L) 39.0 - 52.0 %   MCV 97.0 80.0 - 100.0 fL   MCH 30.4 26.0 - 34.0 pg   MCHC 31.4 30.0 - 36.0 g/dL   RDW 16.5 (H) 11.5 - 15.5 %   Platelets 160 150 - 400 K/uL   nRBC 6.1 (H) 0.0 - 0.2 %  Glucose, capillary  Result Value Ref Range   Glucose-Capillary 108 (H) 70 - 99 mg/dL  Renal function panel (daily at  1600)  Result  Value Ref Range   Sodium 139 135 - 145 mmol/L   Potassium 4.3 3.5 - 5.1 mmol/L   Chloride 104 98 - 111 mmol/L   CO2 26 22 - 32 mmol/L   Glucose, Bld 156 (H) 70 - 99 mg/dL   BUN 12 8 - 23 mg/dL   Creatinine, Ser 3.65 (H) 0.61 - 1.24 mg/dL   Calcium 8.4 (L) 8.9 - 10.3 mg/dL   Phosphorus 2.3 (L) 2.5 - 4.6 mg/dL   Albumin 2.4 (L) 3.5 - 5.0 g/dL   GFR, Estimated 17 (L) >60 mL/min   Anion gap 9 5 - 15  Glucose, capillary  Result Value Ref Range   Glucose-Capillary 114 (H) 70 - 99 mg/dL  Magnesium  Result Value Ref Range   Magnesium 2.4 1.7 - 2.4 mg/dL  Glucose, capillary  Result Value Ref Range   Glucose-Capillary 114 (H) 70 - 99 mg/dL  Glucose, capillary  Result Value Ref Range   Glucose-Capillary 67 (L) 70 - 99 mg/dL  Glucose, capillary  Result Value Ref Range   Glucose-Capillary 146 (H) 70 - 99 mg/dL  Glucose, capillary  Result Value Ref Range   Glucose-Capillary 121 (H) 70 - 99 mg/dL  Renal function panel (daily at 0500)  Result Value Ref Range   Sodium 138 135 - 145 mmol/L   Potassium 4.0 3.5 - 5.1 mmol/L   Chloride 98 98 - 111 mmol/L   CO2 23 22 - 32 mmol/L   Glucose, Bld 148 (H) 70 - 99 mg/dL   BUN 17 8 - 23 mg/dL   Creatinine, Ser 5.23 (H) 0.61 - 1.24 mg/dL   Calcium 8.6 (L) 8.9 - 10.3 mg/dL   Phosphorus 2.5 2.5 - 4.6 mg/dL   Albumin 2.3 (L) 3.5 - 5.0 g/dL   GFR, Estimated 11 (L) >60 mL/min   Anion gap 17 (H) 5 - 15  Magnesium  Result Value Ref Range   Magnesium 2.5 (H) 1.7 - 2.4 mg/dL  Glucose, capillary  Result Value Ref Range   Glucose-Capillary 148 (H) 70 - 99 mg/dL  CBC  Result Value Ref Range   WBC 8.3 4.0 - 10.5 K/uL   RBC 2.51 (L) 4.22 - 5.81 MIL/uL   Hemoglobin 7.7 (L) 13.0 - 17.0 g/dL   HCT 25.0 (L) 39.0 - 52.0 %   MCV 99.6 80.0 - 100.0 fL   MCH 30.7 26.0 - 34.0 pg   MCHC 30.8 30.0 - 36.0 g/dL   RDW 16.7 (H) 11.5 - 15.5 %   Platelets 167 150 - 400 K/uL   nRBC 10.8 (H) 0.0 - 0.2 %  Glucose, capillary  Result Value Ref Range    Glucose-Capillary 147 (H) 70 - 99 mg/dL  Glucose, capillary  Result Value Ref Range   Glucose-Capillary 132 (H) 70 - 99 mg/dL  Glucose, capillary  Result Value Ref Range   Glucose-Capillary 113 (H) 70 - 99 mg/dL  Renal function panel (daily at 0500)  Result Value Ref Range   Sodium 136 135 - 145 mmol/L   Potassium 3.8 3.5 - 5.1 mmol/L   Chloride 100 98 - 111 mmol/L   CO2 26 22 - 32 mmol/L   Glucose, Bld 173 (H) 70 - 99 mg/dL   BUN 21 8 - 23 mg/dL   Creatinine, Ser 7.16 (H) 0.61 - 1.24 mg/dL   Calcium 8.6 (L) 8.9 - 10.3 mg/dL   Phosphorus 3.8 2.5 - 4.6 mg/dL   Albumin 2.5 (L) 3.5 - 5.0 g/dL   GFR, Estimated  8 (L) >60 mL/min   Anion gap 10 5 - 15  Magnesium  Result Value Ref Range   Magnesium 2.6 (H) 1.7 - 2.4 mg/dL  CBC  Result Value Ref Range   WBC 7.4 4.0 - 10.5 K/uL   RBC 2.60 (L) 4.22 - 5.81 MIL/uL   Hemoglobin 7.8 (L) 13.0 - 17.0 g/dL   HCT 26.8 (L) 39.0 - 52.0 %   MCV 103.1 (H) 80.0 - 100.0 fL   MCH 30.0 26.0 - 34.0 pg   MCHC 29.1 (L) 30.0 - 36.0 g/dL   RDW 17.2 (H) 11.5 - 15.5 %   Platelets 178 150 - 400 K/uL   nRBC 4.9 (H) 0.0 - 0.2 %  Glucose, capillary  Result Value Ref Range   Glucose-Capillary 172 (H) 70 - 99 mg/dL  Glucose, capillary  Result Value Ref Range   Glucose-Capillary 121 (H) 70 - 99 mg/dL  Glucose, capillary  Result Value Ref Range   Glucose-Capillary 132 (H) 70 - 99 mg/dL  RPR  Result Value Ref Range   RPR Ser Ql NON REACTIVE NON REACTIVE  HIV Antibody (routine testing w rflx)  Result Value Ref Range   HIV Screen 4th Generation wRfx Non Reactive Non Reactive  Glucose, capillary  Result Value Ref Range   Glucose-Capillary 131 (H) 70 - 99 mg/dL  Glucose, capillary  Result Value Ref Range   Glucose-Capillary 120 (H) 70 - 99 mg/dL  Renal function panel (daily at 0500)  Result Value Ref Range   Sodium 134 (L) 135 - 145 mmol/L   Potassium 3.9 3.5 - 5.1 mmol/L   Chloride 100 98 - 111 mmol/L   CO2 24 22 - 32 mmol/L   Glucose, Bld 140 (H)  70 - 99 mg/dL   BUN 26 (H) 8 - 23 mg/dL   Creatinine, Ser 8.95 (H) 0.61 - 1.24 mg/dL   Calcium 8.8 (L) 8.9 - 10.3 mg/dL   Phosphorus 3.9 2.5 - 4.6 mg/dL   Albumin 2.2 (L) 3.5 - 5.0 g/dL   GFR, Estimated 6 (L) >60 mL/min   Anion gap 10 5 - 15  Magnesium  Result Value Ref Range   Magnesium 2.4 1.7 - 2.4 mg/dL  Glucose, capillary  Result Value Ref Range   Glucose-Capillary 146 (H) 70 - 99 mg/dL  Glucose, capillary  Result Value Ref Range   Glucose-Capillary 134 (H) 70 - 99 mg/dL  Glucose, capillary  Result Value Ref Range   Glucose-Capillary 121 (H) 70 - 99 mg/dL  Glucose, capillary  Result Value Ref Range   Glucose-Capillary 115 (H) 70 - 99 mg/dL  Glucose, capillary  Result Value Ref Range   Glucose-Capillary 128 (H) 70 - 99 mg/dL  Renal function panel (daily at 0500)  Result Value Ref Range   Sodium 137 135 - 145 mmol/L   Potassium 3.9 3.5 - 5.1 mmol/L   Chloride 100 98 - 111 mmol/L   CO2 26 22 - 32 mmol/L   Glucose, Bld 153 (H) 70 - 99 mg/dL   BUN 29 (H) 8 - 23 mg/dL   Creatinine, Ser 10.65 (H) 0.61 - 1.24 mg/dL   Calcium 9.1 8.9 - 10.3 mg/dL   Phosphorus 4.6 2.5 - 4.6 mg/dL   Albumin 2.3 (L) 3.5 - 5.0 g/dL   GFR, Estimated 5 (L) >60 mL/min   Anion gap 11 5 - 15  Magnesium  Result Value Ref Range   Magnesium 2.5 (H) 1.7 - 2.4 mg/dL  CBC  Result Value Ref Range  WBC 6.4 4.0 - 10.5 K/uL   RBC 2.56 (L) 4.22 - 5.81 MIL/uL   Hemoglobin 7.8 (L) 13.0 - 17.0 g/dL   HCT 26.0 (L) 39.0 - 52.0 %   MCV 101.6 (H) 80.0 - 100.0 fL   MCH 30.5 26.0 - 34.0 pg   MCHC 30.0 30.0 - 36.0 g/dL   RDW 17.9 (H) 11.5 - 15.5 %   Platelets 211 150 - 400 K/uL   nRBC 3.7 (H) 0.0 - 0.2 %  Glucose, capillary  Result Value Ref Range   Glucose-Capillary 171 (H) 70 - 99 mg/dL  Lactic acid, plasma  Result Value Ref Range   Lactic Acid, Venous 1.4 0.5 - 1.9 mmol/L  Lactic acid, plasma  Result Value Ref Range   Lactic Acid, Venous 1.4 0.5 - 1.9 mmol/L  Cortisol  Result Value Ref Range    Cortisol, Plasma 18.4 ug/dL  Glucose, capillary  Result Value Ref Range   Glucose-Capillary 121 (H) 70 - 99 mg/dL  Glucose, capillary  Result Value Ref Range   Glucose-Capillary 123 (H) 70 - 99 mg/dL  Renal function panel (daily at 0500)  Result Value Ref Range   Sodium 136 135 - 145 mmol/L   Potassium 3.9 3.5 - 5.1 mmol/L   Chloride 98 98 - 111 mmol/L   CO2 24 22 - 32 mmol/L   Glucose, Bld 129 (H) 70 - 99 mg/dL   BUN 32 (H) 8 - 23 mg/dL   Creatinine, Ser 12.15 (H) 0.61 - 1.24 mg/dL   Calcium 9.4 8.9 - 10.3 mg/dL   Phosphorus 4.9 (H) 2.5 - 4.6 mg/dL   Albumin 2.5 (L) 3.5 - 5.0 g/dL   GFR, Estimated 4 (L) >60 mL/min   Anion gap 14 5 - 15  Glucose, capillary  Result Value Ref Range   Glucose-Capillary 123 (H) 70 - 99 mg/dL  Glucose, capillary  Result Value Ref Range   Glucose-Capillary 126 (H) 70 - 99 mg/dL  Magnesium  Result Value Ref Range   Magnesium 2.5 (H) 1.7 - 2.4 mg/dL  Glucose, capillary  Result Value Ref Range   Glucose-Capillary 136 (H) 70 - 99 mg/dL  Glucose, capillary  Result Value Ref Range   Glucose-Capillary 133 (H) 70 - 99 mg/dL  Renal function panel (daily at 0500)  Result Value Ref Range   Sodium 136 135 - 145 mmol/L   Potassium 4.8 3.5 - 5.1 mmol/L   Chloride 96 (L) 98 - 111 mmol/L   CO2 26 22 - 32 mmol/L   Glucose, Bld 124 (H) 70 - 99 mg/dL   BUN 40 (H) 8 - 23 mg/dL   Creatinine, Ser 13.72 (H) 0.61 - 1.24 mg/dL   Calcium 9.6 8.9 - 10.3 mg/dL   Phosphorus 5.8 (H) 2.5 - 4.6 mg/dL   Albumin 2.2 (L) 3.5 - 5.0 g/dL   GFR, Estimated 3 (L) >60 mL/min   Anion gap 14 5 - 15  CBC  Result Value Ref Range   WBC 6.6 4.0 - 10.5 K/uL   RBC 2.63 (L) 4.22 - 5.81 MIL/uL   Hemoglobin 8.3 (L) 13.0 - 17.0 g/dL   HCT 26.2 (L) 39.0 - 52.0 %   MCV 99.6 80.0 - 100.0 fL   MCH 31.6 26.0 - 34.0 pg   MCHC 31.7 30.0 - 36.0 g/dL   RDW 18.1 (H) 11.5 - 15.5 %   Platelets 215 150 - 400 K/uL   nRBC 0.9 (H) 0.0 - 0.2 %  Glucose, capillary  Result Value  Ref Range    Glucose-Capillary 108 (H) 70 - 99 mg/dL  Magnesium  Result Value Ref Range   Magnesium 2.5 (H) 1.7 - 2.4 mg/dL  Glucose, capillary  Result Value Ref Range   Glucose-Capillary 136 (H) 70 - 99 mg/dL  Glucose, capillary  Result Value Ref Range   Glucose-Capillary 173 (H) 70 - 99 mg/dL  Glucose, capillary  Result Value Ref Range   Glucose-Capillary 137 (H) 70 - 99 mg/dL  Renal function panel (daily at 0500)  Result Value Ref Range   Sodium 138 135 - 145 mmol/L   Potassium 4.5 3.5 - 5.1 mmol/L   Chloride 98 98 - 111 mmol/L   CO2 27 22 - 32 mmol/L   Glucose, Bld 122 (H) 70 - 99 mg/dL   BUN 42 (H) 8 - 23 mg/dL   Creatinine, Ser 14.52 (H) 0.61 - 1.24 mg/dL   Calcium 9.5 8.9 - 10.3 mg/dL   Phosphorus 6.0 (H) 2.5 - 4.6 mg/dL   Albumin 2.3 (L) 3.5 - 5.0 g/dL   GFR, Estimated 3 (L) >60 mL/min   Anion gap 13 5 - 15  Glucose, capillary  Result Value Ref Range   Glucose-Capillary 141 (H) 70 - 99 mg/dL   Comment 1 Notify RN    Comment 2 Document in Chart   Magnesium  Result Value Ref Range   Magnesium 2.5 (H) 1.7 - 2.4 mg/dL  Glucose, capillary  Result Value Ref Range   Glucose-Capillary 174 (H) 70 - 99 mg/dL   Comment 1 Notify RN    Comment 2 Document in Chart   Glucose, capillary  Result Value Ref Range   Glucose-Capillary 168 (H) 70 - 99 mg/dL  Renal function panel (daily at 0500)  Result Value Ref Range   Sodium 137 135 - 145 mmol/L   Potassium 4.0 3.5 - 5.1 mmol/L   Chloride 96 (L) 98 - 111 mmol/L   CO2 25 22 - 32 mmol/L   Glucose, Bld 134 (H) 70 - 99 mg/dL   BUN 39 (H) 8 - 23 mg/dL   Creatinine, Ser 15.16 (H) 0.61 - 1.24 mg/dL   Calcium 9.5 8.9 - 10.3 mg/dL   Phosphorus 6.2 (H) 2.5 - 4.6 mg/dL   Albumin 2.4 (L) 3.5 - 5.0 g/dL   GFR, Estimated 3 (L) >60 mL/min   Anion gap 16 (H) 5 - 15  Glucose, capillary  Result Value Ref Range   Glucose-Capillary 128 (H) 70 - 99 mg/dL  Glucose, capillary  Result Value Ref Range   Glucose-Capillary 158 (H) 70 - 99 mg/dL  Magnesium   Result Value Ref Range   Magnesium 2.6 (H) 1.7 - 2.4 mg/dL  Glucose, capillary  Result Value Ref Range   Glucose-Capillary 134 (H) 70 - 99 mg/dL  Glucose, capillary  Result Value Ref Range   Glucose-Capillary 124 (H) 70 - 99 mg/dL  CBG monitoring, ED  Result Value Ref Range   Glucose-Capillary 98 70 - 99 mg/dL  POCT I-Stat EG7  Result Value Ref Range   pH, Ven 7.362 7.25 - 7.43   pCO2, Ven 47.0 44 - 60 mmHg   pO2, Ven 91 (H) 32 - 45 mmHg   Bicarbonate 26.7 20.0 - 28.0 mmol/L   TCO2 28 22 - 32 mmol/L   O2 Saturation 97 %   Acid-Base Excess 1.0 0.0 - 2.0 mmol/L   Sodium 135 135 - 145 mmol/L   Potassium 3.5 3.5 - 5.1 mmol/L   Calcium, Ion 1.27 1.15 -  1.40 mmol/L   HCT 25.0 (L) 39.0 - 52.0 %   Hemoglobin 8.5 (L) 13.0 - 17.0 g/dL   Sample type VENOUS   POCT I-Stat EG7  Result Value Ref Range   pH, Ven 7.321 7.25 - 7.43   pCO2, Ven 55.0 44 - 60 mmHg   pO2, Ven 36 32 - 45 mmHg   Bicarbonate 28.4 (H) 20.0 - 28.0 mmol/L   TCO2 30 22 - 32 mmol/L   O2 Saturation 63 %   Acid-Base Excess 2.0 0.0 - 2.0 mmol/L   Sodium 135 135 - 145 mmol/L   Potassium 3.5 3.5 - 5.1 mmol/L   Calcium, Ion 1.32 1.15 - 1.40 mmol/L   HCT 25.0 (L) 39.0 - 52.0 %   Hemoglobin 8.5 (L) 13.0 - 17.0 g/dL   Sample type VENOUS    Comment NOTIFIED PHYSICIAN   POCT I-Stat EG7  Result Value Ref Range   pH, Ven 7.334 7.25 - 7.43   pCO2, Ven 53.3 44 - 60 mmHg   pO2, Ven 36 32 - 45 mmHg   Bicarbonate 28.3 (H) 20.0 - 28.0 mmol/L   TCO2 30 22 - 32 mmol/L   O2 Saturation 65 %   Acid-Base Excess 2.0 0.0 - 2.0 mmol/L   Sodium 135 135 - 145 mmol/L   Potassium 3.5 3.5 - 5.1 mmol/L   Calcium, Ion 1.29 1.15 - 1.40 mmol/L   HCT 26.0 (L) 39.0 - 52.0 %   Hemoglobin 8.8 (L) 13.0 - 17.0 g/dL   Sample type VENOUS    Comment NOTIFIED PHYSICIAN   I-STAT, chem 8  Result Value Ref Range   Sodium 138 135 - 145 mmol/L   Potassium 4.8 3.5 - 5.1 mmol/L   Chloride 98 98 - 111 mmol/L   BUN 49 (H) 8 - 23 mg/dL   Creatinine, Ser  >18.00 (H) 0.61 - 1.24 mg/dL   Glucose, Bld 128 (H) 70 - 99 mg/dL   Calcium, Ion 1.21 1.15 - 1.40 mmol/L   TCO2 28 22 - 32 mmol/L   Hemoglobin 6.8 (LL) 13.0 - 17.0 g/dL   HCT 20.0 (L) 39.0 - 52.0 %  I-STAT 7, (LYTES, BLD GAS, ICA, H+H)  Result Value Ref Range   pH, Arterial 7.374 7.35 - 7.45   pCO2 arterial 45.2 32 - 48 mmHg   pO2, Arterial 231 (H) 83 - 108 mmHg   Bicarbonate 26.3 20.0 - 28.0 mmol/L   TCO2 28 22 - 32 mmol/L   O2 Saturation 100 %   Acid-Base Excess 1.0 0.0 - 2.0 mmol/L   Sodium 137 135 - 145 mmol/L   Potassium 4.8 3.5 - 5.1 mmol/L   Calcium, Ion 1.20 1.15 - 1.40 mmol/L   HCT 23.0 (L) 39.0 - 52.0 %   Hemoglobin 7.8 (L) 13.0 - 17.0 g/dL   Sample type ARTERIAL   I-STAT 7, (LYTES, BLD GAS, ICA, H+H)  Result Value Ref Range   pH, Arterial 7.387 7.35 - 7.45   pCO2 arterial 41.0 32 - 48 mmHg   pO2, Arterial 132 (H) 83 - 108 mmHg   Bicarbonate 24.7 20.0 - 28.0 mmol/L   TCO2 26 22 - 32 mmol/L   O2 Saturation 99 %   Acid-Base Excess 0.0 0.0 - 2.0 mmol/L   Sodium 137 135 - 145 mmol/L   Potassium 4.1 3.5 - 5.1 mmol/L   Calcium, Ion 1.16 1.15 - 1.40 mmol/L   HCT 25.0 (L) 39.0 - 52.0 %   Hemoglobin 8.5 (L) 13.0 -  17.0 g/dL   Sample type ARTERIAL   I-STAT 7, (LYTES, BLD GAS, ICA, H+H)  Result Value Ref Range   pH, Arterial 7.323 (L) 7.35 - 7.45   pCO2 arterial 46.0 32 - 48 mmHg   pO2, Arterial 69 (L) 83 - 108 mmHg   Bicarbonate 24.4 20.0 - 28.0 mmol/L   TCO2 26 22 - 32 mmol/L   O2 Saturation 94 %   Acid-base deficit 2.0 0.0 - 2.0 mmol/L   Sodium 139 135 - 145 mmol/L   Potassium 4.3 3.5 - 5.1 mmol/L   Calcium, Ion 1.14 (L) 1.15 - 1.40 mmol/L   HCT 26.0 (L) 39.0 - 52.0 %   Hemoglobin 8.8 (L) 13.0 - 17.0 g/dL   Patient temperature 35.0 C    Sample type ARTERIAL   I-STAT 7, (LYTES, BLD GAS, ICA, H+H)  Result Value Ref Range   pH, Arterial 7.335 (L) 7.35 - 7.45   pCO2 arterial 40.1 32 - 48 mmHg   pO2, Arterial 62 (L) 83 - 108 mmHg   Bicarbonate 21.4 20.0 - 28.0  mmol/L   TCO2 23 22 - 32 mmol/L   O2 Saturation 90 %   Acid-base deficit 4.0 (H) 0.0 - 2.0 mmol/L   Sodium 136 135 - 145 mmol/L   Potassium 5.6 (H) 3.5 - 5.1 mmol/L   Calcium, Ion 1.08 (L) 1.15 - 1.40 mmol/L   HCT 24.0 (L) 39.0 - 52.0 %   Hemoglobin 8.2 (L) 13.0 - 17.0 g/dL   Patient temperature 36.9 C    Sample type ARTERIAL   I-STAT, chem 8  Result Value Ref Range   Sodium 136 135 - 145 mmol/L   Potassium 4.9 3.5 - 5.1 mmol/L   Chloride 100 98 - 111 mmol/L   BUN 44 (H) 8 - 23 mg/dL   Creatinine, Ser >18.00 (H) 0.61 - 1.24 mg/dL   Glucose, Bld 148 (H) 70 - 99 mg/dL   Calcium, Ion 1.08 (L) 1.15 - 1.40 mmol/L   TCO2 26 22 - 32 mmol/L   Hemoglobin 8.8 (L) 13.0 - 17.0 g/dL   HCT 26.0 (L) 39.0 - 52.0 %  I-STAT, chem 8  Result Value Ref Range   Sodium 139 135 - 145 mmol/L   Potassium 3.9 3.5 - 5.1 mmol/L   Chloride 103 98 - 111 mmol/L   BUN 43 (H) 8 - 23 mg/dL   Creatinine, Ser >18.00 (H) 0.61 - 1.24 mg/dL   Glucose, Bld 106 (H) 70 - 99 mg/dL   Calcium, Ion 1.11 (L) 1.15 - 1.40 mmol/L   TCO2 22 22 - 32 mmol/L   Hemoglobin 7.5 (L) 13.0 - 17.0 g/dL   HCT 22.0 (L) 39.0 - 52.0 %  I-STAT 7, (LYTES, BLD GAS, ICA, H+H)  Result Value Ref Range   pH, Arterial 7.353 7.35 - 7.45   pCO2 arterial 29.9 (L) 32 - 48 mmHg   pO2, Arterial 129 (H) 83 - 108 mmHg   Bicarbonate 16.5 (L) 20.0 - 28.0 mmol/L   TCO2 17 (L) 22 - 32 mmol/L   O2 Saturation 99 %   Acid-base deficit 8.0 (H) 0.0 - 2.0 mmol/L   Sodium 139 135 - 145 mmol/L   Potassium 4.6 3.5 - 5.1 mmol/L   Calcium, Ion 0.98 (L) 1.15 - 1.40 mmol/L   HCT 19.0 (L) 39.0 - 52.0 %   Hemoglobin 6.5 (LL) 13.0 - 17.0 g/dL   Patient temperature 37.5 C    Collection site art line  Drawn by Operator    Sample type ARTERIAL   I-STAT 7, (LYTES, BLD GAS, ICA, H+H)  Result Value Ref Range   pH, Arterial 7.287 (L) 7.35 - 7.45   pCO2 arterial 33.7 32 - 48 mmHg   pO2, Arterial 70 (L) 83 - 108 mmHg   Bicarbonate 16.0 (L) 20.0 - 28.0 mmol/L   TCO2  17 (L) 22 - 32 mmol/L   O2 Saturation 91 %   Acid-base deficit 10.0 (H) 0.0 - 2.0 mmol/L   Sodium 136 135 - 145 mmol/L   Potassium 3.9 3.5 - 5.1 mmol/L   Calcium, Ion 1.01 (L) 1.15 - 1.40 mmol/L   HCT 18.0 (L) 39.0 - 52.0 %   Hemoglobin 6.1 (LL) 13.0 - 17.0 g/dL   Patient temperature 37.5 C    Sample type ARTERIAL   I-STAT 7, (LYTES, BLD GAS, ICA, H+H)  Result Value Ref Range   pH, Arterial 7.345 (L) 7.35 - 7.45   pCO2 arterial 29.5 (L) 32 - 48 mmHg   pO2, Arterial 104 83 - 108 mmHg   Bicarbonate 16.1 (L) 20.0 - 28.0 mmol/L   TCO2 17 (L) 22 - 32 mmol/L   O2 Saturation 98 %   Acid-base deficit 9.0 (H) 0.0 - 2.0 mmol/L   Sodium 139 135 - 145 mmol/L   Potassium 4.4 3.5 - 5.1 mmol/L   Calcium, Ion 0.99 (L) 1.15 - 1.40 mmol/L   HCT 16.0 (L) 39.0 - 52.0 %   Hemoglobin 5.4 (LL) 13.0 - 17.0 g/dL   Patient temperature 37.1 C    Sample type ARTERIAL   I-STAT 7, (LYTES, BLD GAS, ICA, H+H)  Result Value Ref Range   pH, Arterial 7.373 7.35 - 7.45   pCO2 arterial 39.3 32 - 48 mmHg   pO2, Arterial 115 (H) 83 - 108 mmHg   Bicarbonate 23.0 20.0 - 28.0 mmol/L   TCO2 24 22 - 32 mmol/L   O2 Saturation 98 %   Acid-base deficit 2.0 0.0 - 2.0 mmol/L   Sodium 134 (L) 135 - 145 mmol/L   Potassium 4.1 3.5 - 5.1 mmol/L   Calcium, Ion 1.11 (L) 1.15 - 1.40 mmol/L   HCT 23.0 (L) 39.0 - 52.0 %   Hemoglobin 7.8 (L) 13.0 - 17.0 g/dL   Patient temperature 98.2 F    Sample type ARTERIAL   I-STAT 7, (LYTES, BLD GAS, ICA, H+H)  Result Value Ref Range   pH, Arterial 7.336 (L) 7.35 - 7.45   pCO2 arterial 25.2 (L) 32 - 48 mmHg   pO2, Arterial 115 (H) 83 - 108 mmHg   Bicarbonate 13.5 (L) 20.0 - 28.0 mmol/L   TCO2 14 (L) 22 - 32 mmol/L   O2 Saturation 98 %   Acid-base deficit 11.0 (H) 0.0 - 2.0 mmol/L   Sodium 142 135 - 145 mmol/L   Potassium 2.7 (LL) 3.5 - 5.1 mmol/L   Calcium, Ion 0.90 (L) 1.15 - 1.40 mmol/L   HCT 15.0 (L) 39.0 - 52.0 %   Hemoglobin 5.1 (LL) 13.0 - 17.0 g/dL   Patient temperature  98.2 F    Sample type ARTERIAL   I-STAT 7, (LYTES, BLD GAS, ICA, H+H)  Result Value Ref Range   pH, Arterial 7.372 7.35 - 7.45   pCO2 arterial 45.6 32 - 48 mmHg   pO2, Arterial 156 (H) 83 - 108 mmHg   Bicarbonate 26.4 20.0 - 28.0 mmol/L   TCO2 28 22 - 32 mmol/L   O2 Saturation 99 %  Acid-Base Excess 1.0 0.0 - 2.0 mmol/L   Sodium 136 135 - 145 mmol/L   Potassium 4.4 3.5 - 5.1 mmol/L   Calcium, Ion 1.15 1.15 - 1.40 mmol/L   HCT 21.0 (L) 39.0 - 52.0 %   Hemoglobin 7.1 (L) 13.0 - 17.0 g/dL   Patient temperature 98.7 F    Sample type ARTERIAL   ECHOCARDIOGRAM COMPLETE  Result Value Ref Range   Weight 3,472 oz   Height 64 in   BP 120/72 mmHg   S' Lateral 3.60 cm   AR max vel 1.52 cm2   AV Area VTI 1.54 cm2   AV Mean grad 9.2 mmHg   AV Peak grad 18.7 mmHg   Ao pk vel 2.16 m/s   MV M vel 4.85 m/s   AV Area mean vel 1.51 cm2   MV VTI 1.40 cm2   MV Peak grad 94.1 mmHg   *Note: Due to a large number of results and/or encounters for the requested time period, some results have not been displayed. A complete set of results can be found in Results Review.   Diabetic Labs (most recent): Lab Results  Component Value Date   HGBA1C 5.9 (H) 05/30/2022   HGBA1C 6.3 12/17/2021   HGBA1C 6.6 (H) 07/22/2021   MICROALBUR 7.4 02/09/2020   MICROALBUR 57.0 06/26/2017   Lipid Panel     Component Value Date/Time   CHOL 88 06/03/2022 0648   CHOL 104 06/10/2021 1337   TRIG 98 06/03/2022 0648   HDL 32 (L) 06/03/2022 0648   HDL 30 (L) 06/10/2021 1337   CHOLHDL 2.8 06/03/2022 0648   VLDL 20 06/03/2022 0648   LDLCALC 36 06/03/2022 0648   LDLCALC 47 11/23/2021 1330     Assessment & Plan:   1) Type 2 diabetes mellitus with stage 4 chronic kidney disease, with long-term current use of insulin (HCC)  -His  Diabetes is complicated by end-stage renal disease recently initiated on hemodialysis,  and patient remains at a high risk for more acute and chronic complications of diabetes which  include CAD, CVA, CKD, retinopathy, and neuropathy. These are all discussed in detail with the patient.  He presents today, accompanied by his family member, with his meter and logs showing at goal glycemic profile.  His previsit A1c checked on 11/14 was 5.9%, decreasing from last visit of 6.3%.  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  Analysis of his meter shows 7-day average of 116, 14-day average of 114, 30-day average of 121.  He denies any hypoglycemia or symptoms of such.   Recent labs reviewed.  - Nutritional counseling repeated at each appointment due to patients tendency to fall back in to old habits.  - The patient admits there is a room for improvement in their diet and drink choices. -  Suggestion is made for the patient to avoid simple carbohydrates from their diet including Cakes, Sweet Desserts / Pastries, Ice Cream, Soda (diet and regular), Sweet Tea, Candies, Chips, Cookies, Sweet Pastries, Store Bought Juices, Alcohol in Excess of 1-2 drinks a day, Artificial Sweeteners, Coffee Creamer, and "Sugar-free" Products. This will help patient to have stable blood glucose profile and potentially avoid unintended weight gain.   - I encouraged the patient to switch to unprocessed or minimally processed complex starch and increased protein intake (animal or plant source), fruits, and vegetables.   - Patient is advised to stick to a routine mealtimes to eat 3 meals a day and avoid unnecessary  snacks (to snack only to correct hypoglycemia).  - I have approached patient with the following individualized plan to manage diabetes and patient agrees.  -He is advised to stay away from alcohol and smoking.  -Given his stable, at goal glycemic profile and lack of hypoglycemia, no changes will be made to his medication regimen today.  He is advised to continue Lantus 22 units SQ nightly and Tradjenta 5 mg po daily.   -He is encouraged to continue monitoring blood glucose twice  daily, before breakfast and before bed, and to call the clinic if he has readings less than 70 or greater than 200 for 3 tests in a row.  He is following with Jearld Fenton, CDE for DM education.   -He will continue to follow-up with nephrology given stage 5 CKD on PD.  -he is not a candidate for metformin and SGLT2 inhibitors due to CKD.   - Patient specific target  for A1c; LDL, HDL, Triglycerides, were discussed in detail.  2) BP/HTN:  His blood pressure is controlled to target. He is advised to continue Norvasc 5 mg po daily, Metoprolol 25 mg po daily, Demadex 100 mg po daily, and Losartan 100 mg po daily.    3) Lipids/HPL:  His recent lipid panel from 06/03/22 shows controlled LDL stable at 36.  He is advised to continue Simvastatin 20 mg p.o. nightly.    4)  Weight/Diet: His Body mass index is 35.74 kg/m.- clearly complicating his DM care. He is a candidate for modest weight loss.   CDE consult in progress, exercise, and carbohydrates information provided.  5) Chronic Care/Health Maintenance: -Patient is on ARB and Statin medications and encouraged to continue to follow up with Ophthalmology, nephrology (he is status post fistula placement in prep for hemodialysis) Podiatrist at least yearly or according to recommendations, and advised to quit smoking. I have recommended yearly flu vaccine and pneumonia vaccination at least every 5 years; moderate intensity exercise for up to 150 minutes weekly; and  sleep for at least 7 hours a day.  - I advised patient to maintain close follow up with his PCP for primary care needs.         I spent 30 minutes in the care of the patient today including review of labs from [place], Lipids, Thyroid Function, Hematology (current and previous including abstractions from other facilities); face-to-face time discussing  his blood glucose readings/logs, discussing hypoglycemia and hyperglycemia episodes and symptoms, medications doses, his options of short  and long term treatment based on the latest standards of care / guidelines;  discussion about incorporating lifestyle medicine;  and documenting the encounter. Risk reduction counseling performed per USPSTF guidelines to reduce obesity and cardiovascular risk factors.     Please refer to Patient Instructions for Blood Glucose Monitoring and Insulin/Medications Dosing Guide"  in media tab for additional information. Please  also refer to " Patient Self Inventory" in the Media  tab for reviewed elements of pertinent patient history.  [name] [name] participated in the discussions, expressed understanding, and voiced agreement with the above plans.  All questions were answered to his satisfaction. he is encouraged to contact clinic should he have any questions or concerns prior to his return visit.    Follow up plan: -Return in about 6 months (around 01/11/2023) for Diabetes F/U with A1c in office, No previsit labs, Bring meter and logs.     Rayetta Pigg, [facility] [address] [place], [place] [address] Phone: [phone] Fax:  [phone]   07/12/2022, 9:18 AM

## 2022-07-13 DIAGNOSIS — N186 End stage renal disease: Secondary | ICD-10-CM | POA: Diagnosis not present

## 2022-07-13 DIAGNOSIS — D509 Iron deficiency anemia, unspecified: Secondary | ICD-10-CM | POA: Diagnosis not present

## 2022-07-13 DIAGNOSIS — D631 Anemia in chronic kidney disease: Secondary | ICD-10-CM | POA: Diagnosis not present

## 2022-07-13 DIAGNOSIS — N250 Renal osteodystrophy: Secondary | ICD-10-CM | POA: Diagnosis not present

## 2022-07-13 DIAGNOSIS — Z992 Dependence on renal dialysis: Secondary | ICD-10-CM | POA: Diagnosis not present

## 2022-07-14 DIAGNOSIS — Z992 Dependence on renal dialysis: Secondary | ICD-10-CM | POA: Diagnosis not present

## 2022-07-14 DIAGNOSIS — D509 Iron deficiency anemia, unspecified: Secondary | ICD-10-CM | POA: Diagnosis not present

## 2022-07-14 DIAGNOSIS — I132 Hypertensive heart and chronic kidney disease with heart failure and with stage 5 chronic kidney disease, or end stage renal disease: Secondary | ICD-10-CM | POA: Diagnosis not present

## 2022-07-14 DIAGNOSIS — I509 Heart failure, unspecified: Secondary | ICD-10-CM | POA: Diagnosis not present

## 2022-07-14 DIAGNOSIS — Z48812 Encounter for surgical aftercare following surgery on the circulatory system: Secondary | ICD-10-CM | POA: Diagnosis not present

## 2022-07-14 DIAGNOSIS — Z951 Presence of aortocoronary bypass graft: Secondary | ICD-10-CM | POA: Diagnosis not present

## 2022-07-14 DIAGNOSIS — N250 Renal osteodystrophy: Secondary | ICD-10-CM | POA: Diagnosis not present

## 2022-07-14 DIAGNOSIS — I214 Non-ST elevation (NSTEMI) myocardial infarction: Secondary | ICD-10-CM | POA: Diagnosis not present

## 2022-07-14 DIAGNOSIS — I2510 Atherosclerotic heart disease of native coronary artery without angina pectoris: Secondary | ICD-10-CM | POA: Diagnosis not present

## 2022-07-14 DIAGNOSIS — D631 Anemia in chronic kidney disease: Secondary | ICD-10-CM | POA: Diagnosis not present

## 2022-07-14 DIAGNOSIS — N186 End stage renal disease: Secondary | ICD-10-CM | POA: Diagnosis not present

## 2022-07-15 DIAGNOSIS — D631 Anemia in chronic kidney disease: Secondary | ICD-10-CM | POA: Diagnosis not present

## 2022-07-15 DIAGNOSIS — N250 Renal osteodystrophy: Secondary | ICD-10-CM | POA: Diagnosis not present

## 2022-07-15 DIAGNOSIS — Z992 Dependence on renal dialysis: Secondary | ICD-10-CM | POA: Diagnosis not present

## 2022-07-15 DIAGNOSIS — N186 End stage renal disease: Secondary | ICD-10-CM | POA: Diagnosis not present

## 2022-07-15 DIAGNOSIS — D509 Iron deficiency anemia, unspecified: Secondary | ICD-10-CM | POA: Diagnosis not present

## 2022-07-16 DIAGNOSIS — D631 Anemia in chronic kidney disease: Secondary | ICD-10-CM | POA: Diagnosis not present

## 2022-07-16 DIAGNOSIS — D509 Iron deficiency anemia, unspecified: Secondary | ICD-10-CM | POA: Diagnosis not present

## 2022-07-16 DIAGNOSIS — N186 End stage renal disease: Secondary | ICD-10-CM | POA: Diagnosis not present

## 2022-07-16 DIAGNOSIS — Z992 Dependence on renal dialysis: Secondary | ICD-10-CM | POA: Diagnosis not present

## 2022-07-16 DIAGNOSIS — N250 Renal osteodystrophy: Secondary | ICD-10-CM | POA: Diagnosis not present

## 2022-07-17 DIAGNOSIS — D509 Iron deficiency anemia, unspecified: Secondary | ICD-10-CM | POA: Diagnosis not present

## 2022-07-17 DIAGNOSIS — N250 Renal osteodystrophy: Secondary | ICD-10-CM | POA: Diagnosis not present

## 2022-07-17 DIAGNOSIS — Z992 Dependence on renal dialysis: Secondary | ICD-10-CM | POA: Diagnosis not present

## 2022-07-17 DIAGNOSIS — D631 Anemia in chronic kidney disease: Secondary | ICD-10-CM | POA: Diagnosis not present

## 2022-07-17 DIAGNOSIS — N186 End stage renal disease: Secondary | ICD-10-CM | POA: Diagnosis not present

## 2022-07-18 DIAGNOSIS — M79672 Pain in left foot: Secondary | ICD-10-CM | POA: Diagnosis not present

## 2022-07-18 DIAGNOSIS — M79671 Pain in right foot: Secondary | ICD-10-CM | POA: Diagnosis not present

## 2022-07-18 DIAGNOSIS — D509 Iron deficiency anemia, unspecified: Secondary | ICD-10-CM | POA: Diagnosis not present

## 2022-07-18 DIAGNOSIS — N186 End stage renal disease: Secondary | ICD-10-CM | POA: Diagnosis not present

## 2022-07-18 DIAGNOSIS — N250 Renal osteodystrophy: Secondary | ICD-10-CM | POA: Diagnosis not present

## 2022-07-18 DIAGNOSIS — L84 Corns and callosities: Secondary | ICD-10-CM | POA: Diagnosis not present

## 2022-07-18 DIAGNOSIS — E1142 Type 2 diabetes mellitus with diabetic polyneuropathy: Secondary | ICD-10-CM | POA: Diagnosis not present

## 2022-07-18 DIAGNOSIS — D631 Anemia in chronic kidney disease: Secondary | ICD-10-CM | POA: Diagnosis not present

## 2022-07-18 DIAGNOSIS — Z992 Dependence on renal dialysis: Secondary | ICD-10-CM | POA: Diagnosis not present

## 2022-07-19 ENCOUNTER — Ambulatory Visit (HOSPITAL_COMMUNITY): Payer: Medicare Other | Attending: Orthopedic Surgery | Admitting: Physical Therapy

## 2022-07-19 DIAGNOSIS — R413 Other amnesia: Secondary | ICD-10-CM | POA: Diagnosis not present

## 2022-07-19 DIAGNOSIS — Z794 Long term (current) use of insulin: Secondary | ICD-10-CM | POA: Diagnosis not present

## 2022-07-19 DIAGNOSIS — I259 Chronic ischemic heart disease, unspecified: Secondary | ICD-10-CM | POA: Diagnosis not present

## 2022-07-19 DIAGNOSIS — E785 Hyperlipidemia, unspecified: Secondary | ICD-10-CM | POA: Diagnosis not present

## 2022-07-19 DIAGNOSIS — D509 Iron deficiency anemia, unspecified: Secondary | ICD-10-CM | POA: Diagnosis not present

## 2022-07-19 DIAGNOSIS — I10 Essential (primary) hypertension: Secondary | ICD-10-CM | POA: Diagnosis not present

## 2022-07-19 DIAGNOSIS — R251 Tremor, unspecified: Secondary | ICD-10-CM | POA: Diagnosis not present

## 2022-07-19 DIAGNOSIS — N250 Renal osteodystrophy: Secondary | ICD-10-CM | POA: Diagnosis not present

## 2022-07-19 DIAGNOSIS — E119 Type 2 diabetes mellitus without complications: Secondary | ICD-10-CM | POA: Diagnosis not present

## 2022-07-19 DIAGNOSIS — Z992 Dependence on renal dialysis: Secondary | ICD-10-CM | POA: Diagnosis not present

## 2022-07-19 DIAGNOSIS — N186 End stage renal disease: Secondary | ICD-10-CM | POA: Diagnosis not present

## 2022-07-19 DIAGNOSIS — E1165 Type 2 diabetes mellitus with hyperglycemia: Secondary | ICD-10-CM | POA: Diagnosis not present

## 2022-07-19 DIAGNOSIS — D631 Anemia in chronic kidney disease: Secondary | ICD-10-CM | POA: Diagnosis not present

## 2022-07-19 DIAGNOSIS — R3982 Chronic bladder pain: Secondary | ICD-10-CM | POA: Diagnosis not present

## 2022-07-19 NOTE — Therapy (Deleted)
OUTPATIENT PHYSICAL THERAPY CERVICAL EVALUATION   Patient Name: [name] [name] MRN: [id number] DOB:23-Oct-1950, 72 y.o., male Today's Date: 07/19/2022  END OF SESSION:   Past Medical History:  Diagnosis Date   Anemia    Arthritis    Asthma    Bell palsy    CAD (coronary artery disease)    a. 2014 MV: abnl w/ infap ischemia; b. 03/2013 Cath: aneurysmal bleb in the LAD w/ otw nonobs dzs-->Med Rx.   Chronic back pain    Chronic knee pain    a. 09/2015 s/p R TKA.   Chronic pain    Chronic shoulder pain    Chronic sinusitis    COPD (chronic obstructive pulmonary disease) ([place])    Diabetes mellitus without complication ([place])    type II    ESRD on peritoneal dialysis ([place])    on peritoneal dialysis, [facility]   Essential hypertension    GERD (gastroesophageal reflux disease)    Gout    Gout    Hepatomegaly    noted on noncontrast CT 2015   History of hiatal hernia    Hyperlipidemia    Lateral meniscus tear    Obesity    Truncal   Obstructive sleep apnea    does not use cpap    On home oxygen therapy    uses 2l when is going somewhere per patient    PUD (peptic ulcer disease)    remote, reports f/u EGD about 8 years ago unremarkable    Reactive airway disease    related to exposure to chemical during 9/11   Sinusitis    Vitamin D deficiency    Past Surgical History:  Procedure Laterality Date   ASAD LT SHOULDER  12/15/2008   left shoulder   AV FISTULA PLACEMENT Left 08/09/2016   Procedure: BRACHIOCEPHALIC ARTERIOVENOUS (AV) FISTULA CREATION LEFT ARM;  Surgeon: [name], MD;  Location: [place];  Service: Vascular;  Laterality: Left;   CAPD INSERTION N/A 10/07/2018   Procedure: LAPAROSCOPIC PERITONEAL CATHETER PLACEMENT;  Surgeon: Mickeal Skinner, MD;  [facility];  Service: General;  Laterality: N/A;   CATARACT EXTRACTION W/PHACO Left 03/28/2016   Procedure: CATARACT EXTRACTION PHACO AND INTRAOCULAR LENS PLACEMENT LEFT EYE;  Surgeon: [name], MD;  Location: [facility];  Service: Ophthalmology;  Laterality: Left;  CDE: 4.77   CATARACT EXTRACTION W/PHACO Right 04/11/2016   Procedure: CATARACT EXTRACTION PHACO AND INTRAOCULAR LENS PLACEMENT RIGHT EYE; CDE:  4.74;  Surgeon: [name], MD;  Location: [facility];  Service: Ophthalmology;  Laterality: Right;   COLONOSCOPY  10/15/2008   Fields: Rectal polyp obliterated, not retrieved, hemorrhoids, single ascending colon diverticulum near the CV. Next colonoscopy April 2020   COLONOSCOPY N/A 12/25/2014   SLF: 1. Colorectal polyps (2) removed 2. Small internal hemorrhoids 3. the left colon is severely redundant. hyperplastic polyps   CORONARY ARTERY BYPASS GRAFT N/A 06/05/2022   Procedure: OFF PUMP CORONARY ARTERY BYPASS GRAFTING (CABG) X 2 BYPASSES USING LEFT INTERNAL MAMMARY ARTERY AND RIGHT LEG GREATER SAPHENOUS VEIN HARVESTED ENDOSCOPICALLY;  Surgeon: Lajuana Matte, MD;  Location: [place];  Service: Open Heart Surgery;  Laterality: N/A;   CORONARY STENT INTERVENTION N/A 07/25/2021   Procedure: CORONARY STENT INTERVENTION;  Surgeon: Sherren Mocha, MD;  Location: [place] [facility];  Service: Cardiovascular;  Laterality: N/A;   CORONARY STENT INTERVENTION N/A 12/26/2021   Procedure: CORONARY STENT INTERVENTION;  Surgeon: Nelva Bush, MD;  Location: [place] [facility];  Service: Cardiovascular;  Laterality:  N/A;   CORONARY STENT INTERVENTION N/A 01/20/2022   Procedure: CORONARY STENT INTERVENTION;  Surgeon: Sherren Mocha, MD;  Location: [place] [facility];  Service: Cardiovascular;  Laterality: N/A;   DOPPLER ECHOCARDIOGRAPHY     ESOPHAGOGASTRODUODENOSCOPY N/A 12/25/2014   SLF: 1. Anemia most likely due to CRI, gastritis, gastric polyps 2. Moderate non-erosive gastriits and mild duodenitis.  3.TWo large gstric polyps removed.    EYE SURGERY  12/22/2010   tear duct probing-[place]   FOREIGN BODY REMOVAL  03/29/2011   Procedure: REMOVAL FOREIGN BODY EXTREMITY;  Surgeon: Arther Abbott, MD;  [facility];  Service: Orthopedics;  Laterality: Right;  Removal Foreign Body Right Thumb   IR FLUORO GUIDE CV LINE RIGHT  08/06/2018   IR US GUIDE VASC ACCESS RIGHT  08/06/2018   KNEE ARTHROSCOPY  10/16/2007   left   KNEE ARTHROSCOPY WITH LATERAL MENISECTOMY Right 10/14/2015   Procedure: LEFT KNEE ARTHROSCOPY WITH PARTIAL LATERAL MENISECTOMY;  Surgeon: Carole Civil, MD;  [facility];  Service: Orthopedics;  Laterality: Right;   LEFT HEART CATH AND CORONARY ANGIOGRAPHY N/A 07/25/2021   Procedure: LEFT HEART CATH AND CORONARY ANGIOGRAPHY;  Surgeon: Sherren Mocha, MD;  Location: [place] [facility];  Service: Cardiovascular;  Laterality: N/A;   LEFT HEART CATH AND CORONARY ANGIOGRAPHY N/A 12/26/2021   Procedure: LEFT HEART CATH AND CORONARY ANGIOGRAPHY;  Surgeon: Nelva Bush, MD;  Location: [place] [facility];  Service: Cardiovascular;  Laterality: N/A;   LEFT HEART CATH AND CORONARY ANGIOGRAPHY N/A 01/20/2022   Procedure: LEFT HEART CATH AND CORONARY ANGIOGRAPHY;  Surgeon: Sherren Mocha, MD;  Location: [place] [facility];  Service: Cardiovascular;  Laterality: N/A;   LEFT HEART CATHETERIZATION WITH CORONARY ANGIOGRAM N/A 03/28/2013   Procedure: LEFT HEART CATHETERIZATION WITH CORONARY ANGIOGRAM;  Surgeon: Leonie Man, MD;  Location[facility];  Service: Cardiovascular;  Laterality: N/A;   NM MYOVIEW LTD     PENILE PROSTHESIS IMPLANT N/A 08/16/2015   Procedure: PENILE PROTHESIS INFLATABLE, three piece, Excisional biopsy of Penile ulcer, Penile molding;  Surgeon: Carolan Clines, MD;  [facility];  Service: Urology;  Laterality: N/A;   PENILE PROSTHESIS IMPLANT N/A 12/24/2017   Procedure: REMOVAL AND  REPLACEMENT  COLOPLAST PENILE PROSTHESIS;  Surgeon: Lucas Mallow, MD;  [facility];  Service: Urology;  Laterality: N/A;   QUADRICEPS TENDON REPAIR  07/21/2011   Procedure: REPAIR QUADRICEP TENDON;  Surgeon: Arther Abbott, MD;  [facility];  Service: Orthopedics;  Laterality: Right;   RIGHT/LEFT HEART CATH AND CORONARY ANGIOGRAPHY N/A 05/30/2022   Procedure: RIGHT/LEFT HEART CATH AND CORONARY ANGIOGRAPHY;  Surgeon: Jettie Booze, MD;  Location: [place] [facility];  Service: Cardiovascular;  Laterality: N/A;   TEE WITHOUT CARDIOVERSION N/A 06/05/2022   Procedure: TRANSESOPHAGEAL ECHOCARDIOGRAM (TEE);  Surgeon: Lajuana Matte, MD;  Location: [place];  Service: Open Heart Surgery;  Laterality: N/A;   TOENAIL EXCISION     removed [id number]   UMBILICAL HERNIA REPAIR  07/17/2005   [place]   Patient Active Problem List   Diagnosis Date Noted   OSA (obstructive sleep apnea) 06/15/2022   Hypertension associated with diabetes ([place]) 06/15/2022   Heart block AV second degree 06/15/2022   Peritoneal dialysis catheter in place [facility]) 06/14/2022   End stage renal disease on dialysis ([place]) 06/14/2022   Hyperglycemia 06/12/2022   Shock ([place]) 06/07/2022   Acute respiratory failure with hypoxia ([place]) 06/07/2022   AV block 06/07/2022   S/P CABG  x 2 06/05/2022   Second degree AV block, Mobitz type I 12/27/2021   Unstable angina (HCC)    NSTEMI (non-ST elevated myocardial infarction) [facility])    Rectal bleeding 07/13/2021   Allergic rhinitis 04/07/2020   Asthma 10/02/2019   Chest pain 08/29/2018   Constipation 02/28/2018   ESRD (end stage renal disease) ([place]) 11/13/2017   Foul smelling urine 12/14/2016   Coronary artery disease involving native coronary artery of native heart without angina pectoris 10/27/2016   Chronic kidney disease, stage IV (severe) ([place]) 10/27/2016   Morbid obesity due to excess calories ([place]) 03/22/2016   CAD (coronary artery disease)    Erectile dysfunction 08/16/2015   Hemorrhoids 07/05/2015   Vitamin D deficiency 06/04/2015   OSA on CPAP 05/14/2015   Normocytic anemia 03/24/2015   Fatty liver 12/01/2014   Back pain 06/05/2013   OA (osteoarthritis) of knee 03/15/2011   CTS (carpal tunnel  syndrome) 03/15/2011   Diabetes mellitus with stage 5 chronic kidney disease ([place]) 09/29/2008   Hyperlipidemia 09/29/2008   Essential hypertension 09/29/2008   GERD 09/29/2008    PCP: [name]  REFERRING PROVIDER: Carole Civil, MD  REFERRING DIAG: [phone] (ICD-10-CM) - Trapezius muscle spasm  THERAPY DIAG:  Cervicalgia  Rationale for Evaluation and Treatment: Rehabilitation  ONSET DATE: ***  SUBJECTIVE:                                                                                                                                                                                                         SUBJECTIVE STATEMENT: ***  PERTINENT HISTORY:  Pt has had cervical pain since his admission into the hospital in November.   Pt was admitted on 05/30/22 with unstable angina. S/p CABGx2 on 11/20. ETT Pt with Mobitz type II heart block requiring temporary pacer; per EP, do not plan for permanent PPM. PMH includes ESRD (on PD), DM, CAD, COPD, HLD, obesity, gout, arthritis, chronic pain.  PAIN:  Are you having pain? Yes: NPRS scale: ***/10 Pain location: *** Pain description: *** Aggravating factors: *** Relieving factors: ***  PRECAUTIONS: None  WEIGHT BEARING RESTRICTIONS: No  FALLS:  Has patient fallen in last 6 months? {fallsyesno:27318}  LIVING ENVIRONMENT: Lives with: {OPRC lives with:25569::"lives with their family"} Lives in: {Lives in:25570} Stairs: {opstairs:27293} Has following equipment at home: {Assistive devices:23999}  OCCUPATION: retired  PLOF: Independent  PATIENT GOALS: less pain   NEXT MD VISIT:   OBJECTIVE:   DIAGNOSTIC FINDINGS:  ***  PATIENT SURVEYS:  {rehab surveys:24030}  COGNITION: Overall cognitive status: {cognition:24006}  SENSATION: {sensation:27233}  POSTURE: {  posture:25561}  PALPATION: ***   CERVICAL ROM:   {AROM/PROM:27142} ROM A/PROM (deg) eval  Flexion   Extension   Right lateral flexion   Left  lateral flexion   Right rotation   Left rotation    (Blank rows = not tested)  UPPER EXTREMITY ROM:  {AROM/PROM:27142} ROM Right eval Left eval  Shoulder flexion    Shoulder extension    Shoulder abduction    Shoulder adduction    Shoulder extension    Shoulder internal rotation    Shoulder external rotation    Elbow flexion    Elbow extension    Wrist flexion    Wrist extension    Wrist ulnar deviation    Wrist radial deviation    Wrist pronation    Wrist supination     (Blank rows = not tested)  UPPER EXTREMITY MMT:  MMT Right eval Left eval  Shoulder flexion    Shoulder extension    Shoulder abduction    Shoulder adduction    Shoulder extension    Shoulder internal rotation    Shoulder external rotation    Middle trapezius    Lower trapezius    Elbow flexion    Elbow extension    Wrist flexion    Wrist extension    Wrist ulnar deviation    Wrist radial deviation    Wrist pronation    Wrist supination    Grip strength     (Blank rows = not tested)  CERVICAL SPECIAL TESTS:  {Cervical special tests:25246}  FUNCTIONAL TESTS:  {Functional tests:24029}  TODAY'S TREATMENT:                                                                                                                              DATE: ***   PATIENT EDUCATION:  Education details: *** Person educated: {Person educated:25204} Education method: {Education Method:25205} Education comprehension: {Education Comprehension:25206}  HOME EXERCISE PROGRAM: ***  ASSESSMENT:  CLINICAL IMPRESSION: Patient is a *** y.o. *** who was seen today for physical therapy evaluation and treatment for ***.   OBJECTIVE IMPAIRMENTS: {opptimpairments:25111}.   ACTIVITY LIMITATIONS: {activitylimitations:27494}  PARTICIPATION LIMITATIONS: {participationrestrictions:25113}  PERSONAL FACTORS: {Personal factors:25162} are also affecting patient's functional outcome.   REHAB POTENTIAL:  {rehabpotential:25112}  CLINICAL DECISION MAKING: {clinical decision making:25114}  EVALUATION COMPLEXITY: {Evaluation complexity:25115}   GOALS: Goals reviewed with patient? {yes/no:20286}  SHORT TERM GOALS: Target date: ***  *** Baseline: *** Goal status: {GOALSTATUS:25110}  2.  *** Baseline: *** Goal status: {GOALSTATUS:25110}  3.  *** Baseline: *** Goal status: {GOALSTATUS:25110}  4.  *** Baseline: *** Goal status: {GOALSTATUS:25110}  5.  *** Baseline: *** Goal status: {GOALSTATUS:25110}  6.  *** Baseline: *** Goal status: {GOALSTATUS:25110}  LONG TERM GOALS: Target date: ***  *** Baseline: *** Goal status: {GOALSTATUS:25110}  2.  *** Baseline: *** Goal status: {GOALSTATUS:25110}  3.  *** Baseline: *** Goal status: {GOALSTATUS:25110}  4.  *** Baseline: *** Goal status: {GOALSTATUS:25110}  5.  ***  Baseline: *** Goal status: {GOALSTATUS:25110}  6.  *** Baseline: *** Goal status: {GOALSTATUS:25110}   PLAN:  PT FREQUENCY: {rehab frequency:25116}  PT DURATION: {rehab duration:25117}  PLANNED INTERVENTIONS: {rehab planned interventions:25118::"Therapeutic exercises","Therapeutic activity","Neuromuscular re-education","Balance training","Gait training","Patient/Family education","Self Care","Joint mobilization"}  PLAN FOR NEXT SESSION: ***   Zimir Kittleson,CINDY, PT 07/19/2022, 8:44 AM

## 2022-07-20 DIAGNOSIS — D509 Iron deficiency anemia, unspecified: Secondary | ICD-10-CM | POA: Diagnosis not present

## 2022-07-20 DIAGNOSIS — N186 End stage renal disease: Secondary | ICD-10-CM | POA: Diagnosis not present

## 2022-07-20 DIAGNOSIS — Z992 Dependence on renal dialysis: Secondary | ICD-10-CM | POA: Diagnosis not present

## 2022-07-20 DIAGNOSIS — N250 Renal osteodystrophy: Secondary | ICD-10-CM | POA: Diagnosis not present

## 2022-07-20 DIAGNOSIS — D631 Anemia in chronic kidney disease: Secondary | ICD-10-CM | POA: Diagnosis not present

## 2022-07-21 DIAGNOSIS — D631 Anemia in chronic kidney disease: Secondary | ICD-10-CM | POA: Diagnosis not present

## 2022-07-21 DIAGNOSIS — D509 Iron deficiency anemia, unspecified: Secondary | ICD-10-CM | POA: Diagnosis not present

## 2022-07-21 DIAGNOSIS — N186 End stage renal disease: Secondary | ICD-10-CM | POA: Diagnosis not present

## 2022-07-21 DIAGNOSIS — Z992 Dependence on renal dialysis: Secondary | ICD-10-CM | POA: Diagnosis not present

## 2022-07-21 DIAGNOSIS — N250 Renal osteodystrophy: Secondary | ICD-10-CM | POA: Diagnosis not present

## 2022-07-22 DIAGNOSIS — Z992 Dependence on renal dialysis: Secondary | ICD-10-CM | POA: Diagnosis not present

## 2022-07-22 DIAGNOSIS — D509 Iron deficiency anemia, unspecified: Secondary | ICD-10-CM | POA: Diagnosis not present

## 2022-07-22 DIAGNOSIS — D631 Anemia in chronic kidney disease: Secondary | ICD-10-CM | POA: Diagnosis not present

## 2022-07-22 DIAGNOSIS — N250 Renal osteodystrophy: Secondary | ICD-10-CM | POA: Diagnosis not present

## 2022-07-22 DIAGNOSIS — N186 End stage renal disease: Secondary | ICD-10-CM | POA: Diagnosis not present

## 2022-07-23 DIAGNOSIS — D509 Iron deficiency anemia, unspecified: Secondary | ICD-10-CM | POA: Diagnosis not present

## 2022-07-23 DIAGNOSIS — Z992 Dependence on renal dialysis: Secondary | ICD-10-CM | POA: Diagnosis not present

## 2022-07-23 DIAGNOSIS — D631 Anemia in chronic kidney disease: Secondary | ICD-10-CM | POA: Diagnosis not present

## 2022-07-23 DIAGNOSIS — N186 End stage renal disease: Secondary | ICD-10-CM | POA: Diagnosis not present

## 2022-07-23 DIAGNOSIS — N250 Renal osteodystrophy: Secondary | ICD-10-CM | POA: Diagnosis not present

## 2022-07-24 ENCOUNTER — Other Ambulatory Visit: Payer: Self-pay | Admitting: Thoracic Surgery (Cardiothoracic Vascular Surgery)

## 2022-07-24 DIAGNOSIS — D509 Iron deficiency anemia, unspecified: Secondary | ICD-10-CM | POA: Diagnosis not present

## 2022-07-24 DIAGNOSIS — N250 Renal osteodystrophy: Secondary | ICD-10-CM | POA: Diagnosis not present

## 2022-07-24 DIAGNOSIS — Z992 Dependence on renal dialysis: Secondary | ICD-10-CM | POA: Diagnosis not present

## 2022-07-24 DIAGNOSIS — D631 Anemia in chronic kidney disease: Secondary | ICD-10-CM | POA: Diagnosis not present

## 2022-07-24 DIAGNOSIS — Z951 Presence of aortocoronary bypass graft: Secondary | ICD-10-CM

## 2022-07-24 DIAGNOSIS — N186 End stage renal disease: Secondary | ICD-10-CM | POA: Diagnosis not present

## 2022-07-24 NOTE — Progress Notes (Addendum)
      [address]       [place] [address]             [phone]    HPI: Patient returns for routine postoperative follow-up having undergone CABG x 2 on 06/05/2022. The patient's early postoperative recovery while in the hospital was notable for anemia requiring tranfusion of blood products.  Type 2 heart block, patient asymptomatic and pacing was not felt to be indicated.  Short course of HD, prior to transition back to peritoneal dialysis.  Since hospital discharge the patient reports ***.   Current Outpatient Medications  Medication Sig Dispense Refill   albuterol (VENTOLIN HFA) 108 (90 Base) MCG/ACT inhaler Inhale 2 puffs into the lungs every 6 (six) hours as needed for wheezing or shortness of breath. 18 g 11   aspirin EC 81 MG tablet Take 81 mg by mouth daily. Swallow whole.     atorvastatin (LIPITOR) 80 MG tablet Take 1 tablet (80 mg total) by mouth daily. 30 tablet 1   calcitRIOL (ROCALTROL) 0.25 MCG capsule Take 0.25 mcg by mouth daily.     clopidogrel (PLAVIX) 75 MG tablet Take 1 tablet (75 mg total) by mouth daily. Please take first dose at 9 pm on 12/4 30 tablet 1   DROPLET PEN NEEDLES 31G X 5 MM MISC USE TO INJECT INSULIN DAILY AS DIRECTED 100 each 1   esomeprazole (NEXIUM) 40 MG capsule Take 1 capsule (40 mg total) by mouth 2 (two) times daily before a meal. 60 capsule 2   insulin glargine (LANTUS SOLOSTAR) 100 UNIT/ML Solostar Pen Inject 22 Units into the skin at bedtime. 18 mL 3   linagliptin (TRADJENTA) 5 MG TABS tablet Take 1 tablet (5 mg total) by mouth daily. 90 tablet 3   midodrine (PROAMATINE) 5 MG tablet Take 3 tablets (15 mg total) by mouth 3 (three) times daily with meals. 90 tablet 1   ONETOUCH ULTRA test strip USE TO CHECK BLOOD SUGAR TWICE DAILY 200 strip 2   oxybutynin (DITROPAN) 5 MG tablet Take 1 tablet (5 mg total) by mouth 2 (two) times daily. (Patient not taking: Reported on 07/12/2022) 60 tablet 1   oxyCODONE (OXY IR/ROXICODONE) 5 MG  immediate release tablet Take 1 tablet (5 mg total) by mouth every 6 (six) hours as needed for severe pain. 30 tablet 0   sevelamer (RENAGEL) 800 MG tablet Take 2 tablets (1,600 mg total) by mouth 3 (three) times daily with meals. 180 tablet 1   Vitamin D, Ergocalciferol, (DRISDOL) 1.25 MG (50000 UNIT) CAPS capsule Take 50,000 Units by mouth every 7 (seven) days. Monday     No current facility-administered medications for this visit.    Physical Exam: ***  Diagnostic Tests: ***   A/P:  S/P CABG x 2, off pump- complicated by Type 2 Mobitz HB and Hypotension- no BB or anti-hypertensive agents at this time.. continue Midodrine ESRD- on Peritoneal dialysis DM-good control is imperative for graft longevity Activity- increase ambulation as tolerated, okay to resume driving, sternal precautions as discussed Cardiac rehab- you are encouraged to enroll and participate if interested  Ellwood Handler, PA-C [facility] [phone]

## 2022-07-24 NOTE — Patient Instructions (Signed)
You are encouraged to enroll and participate in the outpatient cardiac rehab program beginning as soon as practical.  Make every effort to keep your diabetes under very tight control.  Follow up closely with your primary care physician or endocrinologist and strive to keep their hemoglobin A1c levels as low as possible, preferably near or below 6.0.  The long term benefits of strict control of diabetes are far reaching and critically important for your overall health and survival.  You may return to driving an automobile as long as you are no longer requiring oral narcotic pain relievers during the daytime.  It would be wise to start driving only short distances during the daylight and gradually increase from there as you feel comfortable.  Make every effort to maintain a "heart-healthy" lifestyle with regular physical exercise and adherence to a low-fat, low-carbohydrate diet.  Continue to seek regular follow-up appointments with your primary care physician and/or cardiologist.  You may continue to gradually increase your physical activity as tolerated.  Refrain from any heavy lifting or strenuous use of your arms and shoulders until at least 8 weeks from the time of your surgery, and avoid activities that cause increased pain in your chest on the side of your surgical incision.  Otherwise you may continue to increase activities without any particular limitations.  Increase the intensity and duration of physical activity gradually.

## 2022-07-25 ENCOUNTER — Ambulatory Visit (INDEPENDENT_AMBULATORY_CARE_PROVIDER_SITE_OTHER): Payer: Self-pay | Admitting: Physician Assistant

## 2022-07-25 ENCOUNTER — Ambulatory Visit
Admission: RE | Admit: 2022-07-25 | Discharge: 2022-07-25 | Disposition: A | Discharge status: Home / Self Care | Payer: Medicare Other | Source: Ambulatory Visit | Attending: Thoracic Surgery (Cardiothoracic Vascular Surgery) | Admitting: Thoracic Surgery (Cardiothoracic Vascular Surgery)

## 2022-07-25 VITALS — BP 120/74 | HR 50 | Resp 20 | Ht 64.0 in | Wt 213.0 lb

## 2022-07-25 DIAGNOSIS — D509 Iron deficiency anemia, unspecified: Secondary | ICD-10-CM | POA: Diagnosis not present

## 2022-07-25 DIAGNOSIS — D631 Anemia in chronic kidney disease: Secondary | ICD-10-CM | POA: Diagnosis not present

## 2022-07-25 DIAGNOSIS — N250 Renal osteodystrophy: Secondary | ICD-10-CM | POA: Diagnosis not present

## 2022-07-25 DIAGNOSIS — N186 End stage renal disease: Secondary | ICD-10-CM | POA: Diagnosis not present

## 2022-07-25 DIAGNOSIS — Z992 Dependence on renal dialysis: Secondary | ICD-10-CM | POA: Diagnosis not present

## 2022-07-25 DIAGNOSIS — Z951 Presence of aortocoronary bypass graft: Secondary | ICD-10-CM

## 2022-07-25 NOTE — Progress Notes (Unsigned)
PCP:  Carrolyn Meiers, MD Primary Cardiologist: Pixie Casino, MD Electrophysiologist: Will Meredith Leeds, MD   [name] [name] is a 72 y.o. male seen today for Will Meredith Leeds, MD for post hospital follow up.    Admitted 11/14 - 06/19/2022 for CABG. Post op course was complicated by bradycardia with borderline 1st and 2nd degree AV block. After pressors were weaned off, were able to turn down pacing to back up only, and after several days of asymptomatic bradycardia it was ultimately removed.   Since discharge from hospital the patient reports doing very well. Still having some sternal pain but seen by TCTS this week and felt to be within normal expected range.  he denies chest pain, palpitations, dyspnea, PND, orthopnea, nausea, vomiting, dizziness, syncope, edema, weight gain, or early satiety.   Past Medical History:  Diagnosis Date   Anemia    Arthritis    Asthma    Bell palsy    CAD (coronary artery disease)    a. 2014 MV: abnl w/ infap ischemia; b. 03/2013 Cath: aneurysmal bleb in the LAD w/ otw nonobs dzs-->Med Rx.   Chronic back pain    Chronic knee pain    a. 09/2015 s/p R TKA.   Chronic pain    Chronic shoulder pain    Chronic sinusitis    COPD (chronic obstructive pulmonary disease) ([place])    Diabetes mellitus without complication ([place])    type II    ESRD on peritoneal dialysis ([place])    on peritoneal dialysis, [facility]   Essential hypertension    GERD (gastroesophageal reflux disease)    Gout    Gout    Hepatomegaly    noted on noncontrast CT 2015   History of hiatal hernia    Hyperlipidemia    Lateral meniscus tear    Obesity    Truncal   Obstructive sleep apnea    does not use cpap    On home oxygen therapy    uses 2l when is going somewhere per patient    PUD (peptic ulcer disease)    remote, reports f/u EGD about 8 years ago unremarkable    Reactive airway disease    related to exposure to chemical during 9/11   Sinusitis     Vitamin D deficiency     Current Outpatient Medications  Medication Instructions   albuterol (VENTOLIN HFA) 108 (90 Base) MCG/ACT inhaler 2 puffs, Inhalation, Every 6 hours PRN   aspirin EC 81 mg, Oral, Daily, Swallow whole.    atorvastatin (LIPITOR) 80 mg, Oral, Daily   calcitRIOL (ROCALTROL) 0.25 mcg, Oral, Daily   clopidogrel (PLAVIX) 75 mg, Oral, Daily, Please take first dose at 9 pm on 12/4   DROPLET PEN NEEDLES 31G X 5 MM MISC USE TO INJECT INSULIN DAILY AS DIRECTED   esomeprazole (NEXIUM) 40 mg, Oral, 2 times daily before meals   Lantus SoloStar 22 Units, Subcutaneous, Daily at bedtime   linagliptin (TRADJENTA) 5 mg, Oral, Daily   midodrine (PROAMATINE) 15 mg, Oral, 3 times daily with meals   ONETOUCH ULTRA test strip USE TO CHECK BLOOD SUGAR TWICE DAILY   sevelamer (RENAGEL) 1,600 mg, Oral, 3 times daily with meals   Vitamin D (Ergocalciferol) (DRISDOL) 50,000 Units, Oral, Every 7 days, Monday    Physical Exam: Vitals:   07/27/22 1134  BP: 138/60  Pulse: (!) 52  SpO2: 97%  Weight: 213 lb 9.6 oz (96.9 kg)  Height: '5\' 4"'$  (1.626 m)  GEN- The patient is well appearing, alert and oriented x 3 today.   HEENT: normocephalic, atraumatic Lungs- Clear to ausculation bilaterally, normal work of breathing.   Heart- Regularly irregular rate and rhythm, no murmurs, rubs or gallops Extremities- Trace peripheral edema. no clubbing or cyanosis Skin- warm and dry, no rash or lesion Psych- euthymic mood, full affect  EKG is ordered. Personal review of EKG from today shows Mobitz 1 at 52 bpm  Additional studies reviewed include: Previous EP and admission notes   Assessment and Plan:  1. Advanced AV block Mobitz 1 with 2:1 and known nocturnal CHB at his baseline and has been asymptomatic.  No indication for pacing at this time.  Avoid AV nodal agents.  Given co-morbidities including HD he is a poor transvenous pacing candidate. Would need to consider leadless if he needs pacing  in the future.   2. CAD s/p CABG Denies s/s ischemia  3. ESRD Continues with PD at home, 10 hrs a day.   Follow up with Dr. Curt Bears in in 6 months  Shirley Friar, PA-C  07/27/22 11:46 AM

## 2022-07-26 DIAGNOSIS — D631 Anemia in chronic kidney disease: Secondary | ICD-10-CM | POA: Diagnosis not present

## 2022-07-26 DIAGNOSIS — D509 Iron deficiency anemia, unspecified: Secondary | ICD-10-CM | POA: Diagnosis not present

## 2022-07-26 DIAGNOSIS — N250 Renal osteodystrophy: Secondary | ICD-10-CM | POA: Diagnosis not present

## 2022-07-26 DIAGNOSIS — N186 End stage renal disease: Secondary | ICD-10-CM | POA: Diagnosis not present

## 2022-07-26 DIAGNOSIS — Z992 Dependence on renal dialysis: Secondary | ICD-10-CM | POA: Diagnosis not present

## 2022-07-27 ENCOUNTER — Encounter: Payer: Self-pay | Admitting: Gastroenterology

## 2022-07-27 ENCOUNTER — Ambulatory Visit: Payer: Medicare Other | Attending: Student | Admitting: Student

## 2022-07-27 ENCOUNTER — Encounter: Payer: Self-pay | Admitting: Student

## 2022-07-27 VITALS — BP 138/60 | HR 52 | Ht 64.0 in | Wt 213.6 lb

## 2022-07-27 DIAGNOSIS — I25118 Atherosclerotic heart disease of native coronary artery with other forms of angina pectoris: Secondary | ICD-10-CM | POA: Insufficient documentation

## 2022-07-27 DIAGNOSIS — N250 Renal osteodystrophy: Secondary | ICD-10-CM | POA: Diagnosis not present

## 2022-07-27 DIAGNOSIS — I441 Atrioventricular block, second degree: Secondary | ICD-10-CM | POA: Diagnosis not present

## 2022-07-27 DIAGNOSIS — Z992 Dependence on renal dialysis: Secondary | ICD-10-CM | POA: Diagnosis not present

## 2022-07-27 DIAGNOSIS — D509 Iron deficiency anemia, unspecified: Secondary | ICD-10-CM | POA: Diagnosis not present

## 2022-07-27 DIAGNOSIS — D631 Anemia in chronic kidney disease: Secondary | ICD-10-CM | POA: Diagnosis not present

## 2022-07-27 DIAGNOSIS — N186 End stage renal disease: Secondary | ICD-10-CM | POA: Diagnosis not present

## 2022-07-27 NOTE — Patient Instructions (Signed)
Medication Instructions:  Your physician recommends that you continue on your current medications as directed. Please refer to the Current Medication list given to you today.  *If you need a refill on your cardiac medications before your next appointment, please call your pharmacy*   Lab Work: None  If you have labs (blood work) drawn today and your tests are completely normal, you will receive your results only by: [place] (if you have MyChart) OR A paper copy in the mail If you have any lab test that is abnormal or we need to change your treatment, we will call you to review the results.   Follow-Up: At [facility], you and your health needs are our priority.  As part of our continuing mission to provide you with exceptional heart care, we have created designated Provider Care Teams.  These Care Teams include your primary Cardiologist (physician) and Advanced Practice Providers (APPs -  Physician Assistants and Nurse Practitioners) who all work together to provide you with the care you need, when you need it.  We recommend signing up for the patient portal called "MyChart".  Sign up information is provided on this After Visit Summary.  MyChart is used to connect with patients for Virtual Visits (Telemedicine).  Patients are able to view lab/test results, encounter notes, upcoming appointments, etc.  Non-urgent messages can be sent to your provider as well.   To learn more about what you can do with MyChart, go to [email or web address].    Your next appointment:   6 month(s)  Provider:   Allegra Lai, MD

## 2022-07-28 DIAGNOSIS — N250 Renal osteodystrophy: Secondary | ICD-10-CM | POA: Diagnosis not present

## 2022-07-28 DIAGNOSIS — D509 Iron deficiency anemia, unspecified: Secondary | ICD-10-CM | POA: Diagnosis not present

## 2022-07-28 DIAGNOSIS — D631 Anemia in chronic kidney disease: Secondary | ICD-10-CM | POA: Diagnosis not present

## 2022-07-28 DIAGNOSIS — N186 End stage renal disease: Secondary | ICD-10-CM | POA: Diagnosis not present

## 2022-07-28 DIAGNOSIS — Z992 Dependence on renal dialysis: Secondary | ICD-10-CM | POA: Diagnosis not present

## 2022-07-29 DIAGNOSIS — Z992 Dependence on renal dialysis: Secondary | ICD-10-CM | POA: Diagnosis not present

## 2022-07-29 DIAGNOSIS — N186 End stage renal disease: Secondary | ICD-10-CM | POA: Diagnosis not present

## 2022-07-29 DIAGNOSIS — D509 Iron deficiency anemia, unspecified: Secondary | ICD-10-CM | POA: Diagnosis not present

## 2022-07-29 DIAGNOSIS — D631 Anemia in chronic kidney disease: Secondary | ICD-10-CM | POA: Diagnosis not present

## 2022-07-29 DIAGNOSIS — N250 Renal osteodystrophy: Secondary | ICD-10-CM | POA: Diagnosis not present

## 2022-07-30 DIAGNOSIS — D509 Iron deficiency anemia, unspecified: Secondary | ICD-10-CM | POA: Diagnosis not present

## 2022-07-30 DIAGNOSIS — N186 End stage renal disease: Secondary | ICD-10-CM | POA: Diagnosis not present

## 2022-07-30 DIAGNOSIS — N250 Renal osteodystrophy: Secondary | ICD-10-CM | POA: Diagnosis not present

## 2022-07-30 DIAGNOSIS — D631 Anemia in chronic kidney disease: Secondary | ICD-10-CM | POA: Diagnosis not present

## 2022-07-30 DIAGNOSIS — Z992 Dependence on renal dialysis: Secondary | ICD-10-CM | POA: Diagnosis not present

## 2022-07-31 DIAGNOSIS — N250 Renal osteodystrophy: Secondary | ICD-10-CM | POA: Diagnosis not present

## 2022-07-31 DIAGNOSIS — N186 End stage renal disease: Secondary | ICD-10-CM | POA: Diagnosis not present

## 2022-07-31 DIAGNOSIS — Z992 Dependence on renal dialysis: Secondary | ICD-10-CM | POA: Diagnosis not present

## 2022-07-31 DIAGNOSIS — D631 Anemia in chronic kidney disease: Secondary | ICD-10-CM | POA: Diagnosis not present

## 2022-07-31 DIAGNOSIS — D509 Iron deficiency anemia, unspecified: Secondary | ICD-10-CM | POA: Diagnosis not present

## 2022-08-01 ENCOUNTER — Telehealth: Payer: Self-pay | Admitting: *Deleted

## 2022-08-01 ENCOUNTER — Other Ambulatory Visit: Payer: Self-pay | Admitting: Gastroenterology

## 2022-08-01 DIAGNOSIS — D631 Anemia in chronic kidney disease: Secondary | ICD-10-CM | POA: Diagnosis not present

## 2022-08-01 DIAGNOSIS — D509 Iron deficiency anemia, unspecified: Secondary | ICD-10-CM | POA: Diagnosis not present

## 2022-08-01 DIAGNOSIS — N250 Renal osteodystrophy: Secondary | ICD-10-CM | POA: Diagnosis not present

## 2022-08-01 DIAGNOSIS — R6881 Early satiety: Secondary | ICD-10-CM

## 2022-08-01 DIAGNOSIS — K219 Gastro-esophageal reflux disease without esophagitis: Secondary | ICD-10-CM

## 2022-08-01 DIAGNOSIS — N186 End stage renal disease: Secondary | ICD-10-CM | POA: Diagnosis not present

## 2022-08-01 DIAGNOSIS — Z992 Dependence on renal dialysis: Secondary | ICD-10-CM | POA: Diagnosis not present

## 2022-08-01 MED ORDER — ESOMEPRAZOLE MAGNESIUM 40 MG PO CPDR: 40.0000 mg | DELAYED_RELEASE_CAPSULE | Freq: Two times a day (BID) | ORAL | 1 refills | Status: DC

## 2022-08-01 NOTE — Telephone Encounter (Signed)
Pt's wife Stanton [facility]) called and would like esomprazole sent to [facility] pharmacy, because it's cheaper to get a 90 supply from them.

## 2022-08-02 DIAGNOSIS — D631 Anemia in chronic kidney disease: Secondary | ICD-10-CM | POA: Diagnosis not present

## 2022-08-02 DIAGNOSIS — N186 End stage renal disease: Secondary | ICD-10-CM | POA: Diagnosis not present

## 2022-08-02 DIAGNOSIS — N250 Renal osteodystrophy: Secondary | ICD-10-CM | POA: Diagnosis not present

## 2022-08-02 DIAGNOSIS — D509 Iron deficiency anemia, unspecified: Secondary | ICD-10-CM | POA: Diagnosis not present

## 2022-08-02 DIAGNOSIS — Z992 Dependence on renal dialysis: Secondary | ICD-10-CM | POA: Diagnosis not present

## 2022-08-02 NOTE — Telephone Encounter (Signed)
Noted  

## 2022-08-03 DIAGNOSIS — N250 Renal osteodystrophy: Secondary | ICD-10-CM | POA: Diagnosis not present

## 2022-08-03 DIAGNOSIS — Z992 Dependence on renal dialysis: Secondary | ICD-10-CM | POA: Diagnosis not present

## 2022-08-03 DIAGNOSIS — D631 Anemia in chronic kidney disease: Secondary | ICD-10-CM | POA: Diagnosis not present

## 2022-08-03 DIAGNOSIS — N186 End stage renal disease: Secondary | ICD-10-CM | POA: Diagnosis not present

## 2022-08-03 DIAGNOSIS — D509 Iron deficiency anemia, unspecified: Secondary | ICD-10-CM | POA: Diagnosis not present

## 2022-08-04 DIAGNOSIS — D631 Anemia in chronic kidney disease: Secondary | ICD-10-CM | POA: Diagnosis not present

## 2022-08-04 DIAGNOSIS — N250 Renal osteodystrophy: Secondary | ICD-10-CM | POA: Diagnosis not present

## 2022-08-04 DIAGNOSIS — N186 End stage renal disease: Secondary | ICD-10-CM | POA: Diagnosis not present

## 2022-08-04 DIAGNOSIS — D509 Iron deficiency anemia, unspecified: Secondary | ICD-10-CM | POA: Diagnosis not present

## 2022-08-04 DIAGNOSIS — Z992 Dependence on renal dialysis: Secondary | ICD-10-CM | POA: Diagnosis not present

## 2022-08-05 DIAGNOSIS — N186 End stage renal disease: Secondary | ICD-10-CM | POA: Diagnosis not present

## 2022-08-05 DIAGNOSIS — D631 Anemia in chronic kidney disease: Secondary | ICD-10-CM | POA: Diagnosis not present

## 2022-08-05 DIAGNOSIS — D509 Iron deficiency anemia, unspecified: Secondary | ICD-10-CM | POA: Diagnosis not present

## 2022-08-05 DIAGNOSIS — Z992 Dependence on renal dialysis: Secondary | ICD-10-CM | POA: Diagnosis not present

## 2022-08-05 DIAGNOSIS — N250 Renal osteodystrophy: Secondary | ICD-10-CM | POA: Diagnosis not present

## 2022-08-06 DIAGNOSIS — D631 Anemia in chronic kidney disease: Secondary | ICD-10-CM | POA: Diagnosis not present

## 2022-08-06 DIAGNOSIS — D509 Iron deficiency anemia, unspecified: Secondary | ICD-10-CM | POA: Diagnosis not present

## 2022-08-06 DIAGNOSIS — Z992 Dependence on renal dialysis: Secondary | ICD-10-CM | POA: Diagnosis not present

## 2022-08-06 DIAGNOSIS — N250 Renal osteodystrophy: Secondary | ICD-10-CM | POA: Diagnosis not present

## 2022-08-06 DIAGNOSIS — N186 End stage renal disease: Secondary | ICD-10-CM | POA: Diagnosis not present

## 2022-08-07 DIAGNOSIS — N186 End stage renal disease: Secondary | ICD-10-CM | POA: Diagnosis not present

## 2022-08-07 DIAGNOSIS — D509 Iron deficiency anemia, unspecified: Secondary | ICD-10-CM | POA: Diagnosis not present

## 2022-08-07 DIAGNOSIS — D631 Anemia in chronic kidney disease: Secondary | ICD-10-CM | POA: Diagnosis not present

## 2022-08-07 DIAGNOSIS — N250 Renal osteodystrophy: Secondary | ICD-10-CM | POA: Diagnosis not present

## 2022-08-07 DIAGNOSIS — Z992 Dependence on renal dialysis: Secondary | ICD-10-CM | POA: Diagnosis not present

## 2022-08-08 ENCOUNTER — Encounter (HOSPITAL_COMMUNITY): Payer: Self-pay

## 2022-08-08 ENCOUNTER — Encounter (HOSPITAL_COMMUNITY)
Admission: RE | Admit: 2022-08-08 | Discharge: 2022-08-08 | Disposition: A | Discharge status: Home / Self Care | Payer: Medicare Other | Source: Ambulatory Visit | Attending: Internal Medicine | Admitting: Internal Medicine

## 2022-08-08 VITALS — BP 138/80 | HR 55 | Ht 64.0 in | Wt 208.6 lb

## 2022-08-08 DIAGNOSIS — Z951 Presence of aortocoronary bypass graft: Secondary | ICD-10-CM | POA: Diagnosis not present

## 2022-08-08 DIAGNOSIS — Z992 Dependence on renal dialysis: Secondary | ICD-10-CM | POA: Diagnosis not present

## 2022-08-08 DIAGNOSIS — N250 Renal osteodystrophy: Secondary | ICD-10-CM | POA: Diagnosis not present

## 2022-08-08 DIAGNOSIS — I214 Non-ST elevation (NSTEMI) myocardial infarction: Secondary | ICD-10-CM

## 2022-08-08 DIAGNOSIS — D509 Iron deficiency anemia, unspecified: Secondary | ICD-10-CM | POA: Diagnosis not present

## 2022-08-08 DIAGNOSIS — D631 Anemia in chronic kidney disease: Secondary | ICD-10-CM | POA: Diagnosis not present

## 2022-08-08 DIAGNOSIS — N186 End stage renal disease: Secondary | ICD-10-CM | POA: Diagnosis not present

## 2022-08-08 LAB — GLUCOSE, CAPILLARY: Glucose-Capillary: 118 mg/dL — ABNORMAL HIGH (ref 70–99)

## 2022-08-08 NOTE — Progress Notes (Signed)
Cardiac Individual Treatment Plan  Patient Details  Name: [name] [name] MRN: [id number] Date of Birth: [birth year] Referring Provider:   Flowsheet Row CARDIAC REHAB PHASE II ORIENTATION from 08/08/2022 in [place]  Referring Provider Dr. Kipp Brood       Initial Encounter Date:  Flowsheet Row CARDIAC REHAB PHASE II ORIENTATION from 08/08/2022 in [place]  Date 08/08/22       Visit Diagnosis: NSTEMI (non-ST elevated myocardial infarction) ([place])  S/P CABG x 2  Patient's Home Medications on Admission:  Current Outpatient Medications:    albuterol (VENTOLIN HFA) 108 (90 Base) MCG/ACT inhaler, Inhale 2 puffs into the lungs every 6 (six) hours as needed for wheezing or shortness of breath., Disp: 18 g, Rfl: 11   aspirin EC 81 MG tablet, Take 81 mg by mouth daily. Swallow whole., Disp: , Rfl:    atorvastatin (LIPITOR) 80 MG tablet, Take 1 tablet (80 mg total) by mouth daily., Disp: 30 tablet, Rfl: 1   calcitRIOL (ROCALTROL) 0.25 MCG capsule, Take 0.25 mcg by mouth daily., Disp: , Rfl:    clopidogrel (PLAVIX) 75 MG tablet, Take 1 tablet (75 mg total) by mouth daily. Please take first dose at 9 pm on 12/4, Disp: 30 tablet, Rfl: 1   DROPLET PEN NEEDLES 31G X 5 MM MISC, USE TO INJECT INSULIN DAILY AS DIRECTED, Disp: 100 each, Rfl: 1   esomeprazole (NEXIUM) 40 MG capsule, Take 1 capsule (40 mg total) by mouth 2 (two) times daily before a meal., Disp: 180 capsule, Rfl: 1   insulin glargine (LANTUS SOLOSTAR) 100 UNIT/ML Solostar Pen, Inject 22 Units into the skin at bedtime., Disp: 18 mL, Rfl: 3   linagliptin (TRADJENTA) 5 MG TABS tablet, Take 1 tablet (5 mg total) by mouth daily., Disp: 90 tablet, Rfl: 3   midodrine (PROAMATINE) 5 MG tablet, Take 3 tablets (15 mg total) by mouth 3 (three) times daily with meals., Disp: 90 tablet, Rfl: 1   ONETOUCH ULTRA test strip, USE TO CHECK BLOOD SUGAR TWICE DAILY, Disp: 200 strip, Rfl: 2   sevelamer  (RENAGEL) 800 MG tablet, Take 2 tablets (1,600 mg total) by mouth 3 (three) times daily with meals., Disp: 180 tablet, Rfl: 1   Vitamin D, Ergocalciferol, (DRISDOL) 1.25 MG (50000 UNIT) CAPS capsule, Take 50,000 Units by mouth every 7 (seven) days. Monday, Disp: , Rfl:   Past Medical History: Past Medical History:  Diagnosis Date   Anemia    Arthritis    Asthma    Bell palsy    CAD (coronary artery disease)    a. 2014 MV: abnl w/ infap ischemia; b. 03/2013 Cath: aneurysmal bleb in the LAD w/ otw nonobs dzs-->Med Rx.   Chronic back pain    Chronic knee pain    a. 09/2015 s/p R TKA.   Chronic pain    Chronic shoulder pain    Chronic sinusitis    COPD (chronic obstructive pulmonary disease) ([place])    Diabetes mellitus without complication ([place])    type II    ESRD on peritoneal dialysis ([place])    on peritoneal dialysis, [facility]   Essential hypertension    GERD (gastroesophageal reflux disease)    Gout    Gout    Hepatomegaly    noted on noncontrast CT 2015   History of hiatal hernia    Hyperlipidemia    Lateral meniscus tear    Obesity    Truncal   Obstructive sleep apnea  does not use cpap    On home oxygen therapy    uses 2l when is going somewhere per patient    PUD (peptic ulcer disease)    remote, reports f/u EGD about 8 years ago unremarkable    Reactive airway disease    related to exposure to chemical during 9/11   Sinusitis    Vitamin D deficiency     Tobacco Use: Social History   Tobacco Use  Smoking Status Former   Packs/day: 1.00   Years: 25.00   Total pack years: 25.00   Types: Cigarettes   Quit date: 03/27/2010   Years since quitting: 12.3  Smokeless Tobacco Never  Tobacco Comments   Quit x 7 years    Labs: Review Flowsheet  More data exists      Latest Ref Rng & Units 05/30/2022 06/03/2022 06/05/2022 06/06/2022 06/07/2022  Labs for ITP Cardiac and Pulmonary Rehab  Cholestrol 0 - 200 mg/dL - 88  - - -  LDL (calc) 0 - 99 mg/dL - 36   - - -  HDL-C >40 mg/dL - 32  - - -  Trlycerides <150 mg/dL - 98  - - -  Hemoglobin A1c 4.8 - 5.6 % 5.9  - - - -  PH, Arterial 7.35 - 7.45 - - 7.323  7.387  7.374  7.345  7.287  7.353  7.335  7.372  7.373  7.336   PCO2 arterial 32 - 48 mmHg - - 46.0  41.0  45.2  29.5  33.7  29.9  40.1  45.6  39.3  25.2   Bicarbonate 20.0 - 28.0 mmol/L 28.3  28.4  26.7  - 24.4  24.7  26.3  16.1  16.0  16.5  21.4  26.4  23.0  13.5   TCO2 22 - 32 mmol/L '30  30  28  '$ - '26  22  26  26  28  28  17  17  17  23  28  24  14   '$ Acid-base deficit 0.0 - 2.0 mmol/L - - 2.0  9.0  10.0  8.0  4.0  2.0  11.0   O2 Saturation % 65  63  97  - 94  99  100  98  91  99  90  99  98  98     Capillary Blood Glucose: Lab Results  Component Value Date   GLUCAP 118 (H) 08/08/2022   GLUCAP 124 (H) 06/19/2022   GLUCAP 134 (H) 06/19/2022   GLUCAP 158 (H) 06/18/2022   GLUCAP 128 (H) 06/18/2022    POCT Glucose     Row Name 08/08/22 0835             POCT Blood Glucose   Pre-Exercise 118 mg/dL                Exercise Target Goals: Exercise Program Goal: Individual exercise prescription set using results from initial 6 min walk test and THRR while considering  patient's activity barriers and safety.   Exercise Prescription Goal: Starting with aerobic activity 30 plus minutes a day, 3 days per week for initial exercise prescription. Provide home exercise prescription and guidelines that participant acknowledges understanding prior to discharge.  Activity Barriers & Risk Stratification:  Activity Barriers & Cardiac Risk Stratification - 08/08/22 0844       Activity Barriers & Cardiac Risk Stratification   Activity Barriers Arthritis;Back Problems;Neck/Spine Problems;Joint Problems;Deconditioning;Muscular Weakness;Shortness of Breath;Incisional Pain;Balance Concerns;Assistive Device    Cardiac Risk Stratification  High             6 Minute Walk:  6 Minute Walk     Row Name 08/08/22 1000         6 Minute Walk    Phase Initial     Distance 1000 feet     Walk Time 6 minutes     # of Rest Breaks 0     MPH 1.89     METS 1.8     RPE 13     VO2 Peak 6.31     Symptoms No     Resting HR 55 bpm     Resting BP 138/80     Resting Oxygen Saturation  95 %     Exercise Oxygen Saturation  during 6 min walk 96 %     Max Ex. HR 90 bpm     Max Ex. BP 150/80     2 Minute Post BP 130/80              Oxygen Initial Assessment:   Oxygen Re-Evaluation:   Oxygen Discharge (Final Oxygen Re-Evaluation):   Initial Exercise Prescription:  Initial Exercise Prescription - 08/08/22 1000       Date of Initial Exercise RX and Referring Provider   Date 08/08/22    Referring Provider Dr. Kipp Brood    Expected Discharge Date 11/03/22      Treadmill   MPH 1    Grade 0    Minutes 17      NuStep   Level 1    SPM 80    Minutes 22      Prescription Details   Frequency (times per week) 3    Duration Progress to 30 minutes of continuous aerobic without signs/symptoms of physical distress      Intensity   THRR 40-80% of Max Heartrate 59-118    Ratings of Perceived Exertion 11-13      Resistance Training   Training Prescription Yes    Weight 4    Reps 10-15             Perform Capillary Blood Glucose checks as needed.  Exercise Prescription Changes:   Exercise Comments:   Exercise Goals and Review:   Exercise Goals     Row Name 08/08/22 1002             Exercise Goals   Increase Physical Activity Yes       Intervention Provide advice, education, support and counseling about physical activity/exercise needs.;Develop an individualized exercise prescription for aerobic and resistive training based on initial evaluation findings, risk stratification, comorbidities and participant's personal goals.       Expected Outcomes Short Term: Attend rehab on a regular basis to increase amount of physical activity.;Long Term: Add in home exercise to make exercise part of routine and to  increase amount of physical activity.;Long Term: Exercising regularly at least 3-5 days a week.       Increase Strength and Stamina Yes       Intervention Provide advice, education, support and counseling about physical activity/exercise needs.;Develop an individualized exercise prescription for aerobic and resistive training based on initial evaluation findings, risk stratification, comorbidities and participant's personal goals.       Expected Outcomes Short Term: Increase workloads from initial exercise prescription for resistance, speed, and METs.;Short Term: Perform resistance training exercises routinely during rehab and add in resistance training at home;Long Term: Improve cardiorespiratory fitness, muscular endurance and strength as measured by increased METs  and functional capacity (6MWT)       Able to understand and use rate of perceived exertion (RPE) scale Yes       Intervention Provide education and explanation on how to use RPE scale       Expected Outcomes Short Term: Able to use RPE daily in rehab to express subjective intensity level;Long Term:  Able to use RPE to guide intensity level when exercising independently       Knowledge and understanding of Target Heart Rate Range (THRR) Yes       Intervention Provide education and explanation of THRR including how the numbers were predicted and where they are located for reference       Expected Outcomes Short Term: Able to state/look up THRR;Long Term: Able to use THRR to govern intensity when exercising independently;Short Term: Able to use daily as guideline for intensity in rehab       Able to check pulse independently Yes       Intervention Provide education and demonstration on how to check pulse in carotid and radial arteries.;Review the importance of being able to check your own pulse for safety during independent exercise       Expected Outcomes Short Term: Able to explain why pulse checking is important during independent  exercise;Long Term: Able to check pulse independently and accurately       Understanding of Exercise Prescription Yes       Intervention Provide education, explanation, and written materials on patient's individual exercise prescription       Expected Outcomes Short Term: Able to explain program exercise prescription;Long Term: Able to explain home exercise prescription to exercise independently                Exercise Goals Re-Evaluation :    Discharge Exercise Prescription (Final Exercise Prescription Changes):   Nutrition:  Target Goals: Understanding of nutrition guidelines, daily intake of sodium '1500mg'$ , cholesterol '200mg'$ , calories 30% from fat and 7% or less from saturated fats, daily to have 5 or more servings of fruits and vegetables.  Biometrics:  Pre Biometrics - 08/08/22 1003       Pre Biometrics   Height '5\' 4"'$  (1.626 m)    Weight 208 lb 8.9 oz (94.6 kg)    Waist Circumference 46 inches    Hip Circumference 45 inches    Waist to Hip Ratio 1.02 %    BMI (Calculated) 35.78    Triceps Skinfold 25 mm    % Body Fat 36.3 %    Grip Strength 31.5 kg    Flexibility 0 in    Single Leg Stand 0 seconds              Nutrition Therapy Plan and Nutrition Goals:  Nutrition Therapy & Goals - 08/08/22 0826       Personal Nutrition Goals   Comments Pt scored a 19 on MEDFICTS. He follows a low NA, diabetic, and renal diet.      Intervention Plan   Intervention Nutrition handout(s) given to patient.    Expected Outcomes Short Term Goal: Understand basic principles of dietary content, such as calories, fat, sodium, cholesterol and nutrients.             Nutrition Assessments:  Nutrition Assessments - 08/08/22 0827       MEDFICTS Scores   Pre Score 19            MEDIFICTS Score Key: ?70 Need to make dietary changes  40-70 Heart Healthy  Diet ? 40 Therapeutic Level Cholesterol Diet   Picture Your Plate Scores: <09 Unhealthy dietary pattern with  much room for improvement. 41-50 Dietary pattern unlikely to meet recommendations for good health and room for improvement. 51-60 More healthful dietary pattern, with some room for improvement.  >60 Healthy dietary pattern, although there may be some specific behaviors that could be improved.    Nutrition Goals Re-Evaluation:   Nutrition Goals Discharge (Final Nutrition Goals Re-Evaluation):   Psychosocial: Target Goals: Acknowledge presence or absence of significant depression and/or stress, maximize coping skills, provide positive support system. Participant is able to verbalize types and ability to use techniques and skills needed for reducing stress and depression.  Initial Review & Psychosocial Screening:  Initial Psych Review & Screening - 08/08/22 0845       Initial Review   Current issues with None Identified      Family Dynamics   Good Support System? Yes    Comments His wife is his support system.      Barriers   Psychosocial barriers to participate in program There are no identifiable barriers or psychosocial needs.      Screening Interventions   Interventions Encouraged to exercise;Provide feedback about the scores to participant    Expected Outcomes Long Term goal: The participant improves quality of Life and PHQ9 Scores as seen by post scores and/or verbalization of changes;Short Term goal: Identification and review with participant of any Quality of Life or Depression concerns found by scoring the questionnaire.             Quality of Life Scores:  Quality of Life - 08/08/22 1003       Quality of Life   Select Quality of Life      Quality of Life Scores   Health/Function Pre 14.8 %    Socioeconomic Pre 23.07 %    Psych/Spiritual Pre 26.57 %    Family Pre 30 %    GLOBAL Pre 21.78 %            Scores of 19 and below usually indicate a poorer quality of life in these areas.  A difference of  2-3 points is a clinically meaningful difference.  A  difference of 2-3 points in the total score of the Quality of Life Index has been associated with significant improvement in overall quality of life, self-image, physical symptoms, and general health in studies assessing change in quality of life.  PHQ-9: Review Flowsheet  More data exists      08/08/2022 02/01/2022 12/29/2021 09/29/2021 03/07/2018  Depression screen PHQ 2/9  Decreased Interest 0 0 0 0 0  Down, Depressed, Hopeless 1 0 1 0 -  PHQ - 2 Score 1 0 1 0 0  Altered sleeping 0 - - 0 -  Tired, decreased energy 3 - - 3 -  Change in appetite 2 - - 1 -  Feeling bad or failure about yourself  0 - - 2 -  Trouble concentrating 0 - - 1 -  Moving slowly or fidgety/restless 0 - - 0 -  Suicidal thoughts 0 - - 0 -  PHQ-9 Score 6 - - 7 -  Difficult doing work/chores Somewhat difficult - - Somewhat difficult -   Interpretation of Total Score  Total Score Depression Severity:  1-4 = Minimal depression, 5-9 = Mild depression, 10-14 = Moderate depression, 15-19 = Moderately severe depression, 20-27 = Severe depression   Psychosocial Evaluation and Intervention:  Psychosocial Evaluation - 08/08/22 3267  Psychosocial Evaluation & Interventions   Interventions Stress management education;Relaxation education;Encouraged to exercise with the program and follow exercise prescription    Comments Pt has no barriers to participating in CR. He has no identiable psychosocial issues. He scored a 6 on a PHQ-9 and this relates to his chronic health conditions. He denies any depression. He reports that 2023 was a rough year for him. He had coronary stents placed in January, June, and July. He then had a CABG x 2 on his birthday in [birth year]. He reports he has felt weak since his surgery, and now he has to walk with a cane due to this. He is optimistic that 2024 will be a better year. He continues to cope well with his pertioneal dialysis that he has been undergoing for about 7 years now. He lists his wife as  his support system. His goals while in the program are to increase his strength and stamina and to be healthier. He previously completed 29 sessions of CR, but he was discharged in early June of 2023 due to ongoing anginal symptoms. He is excited to be back in the program and to get his strength back.    Expected Outcomes Patient will continue to have no psychosocial barriers or issues identified.    Continue Psychosocial Services  No Follow up required             Psychosocial Re-Evaluation:   Psychosocial Discharge (Final Psychosocial Re-Evaluation):   Vocational Rehabilitation: Provide vocational rehab assistance to qualifying candidates.   Vocational Rehab Evaluation & Intervention:  Vocational Rehab - 08/08/22 0825       Initial Vocational Rehab Evaluation & Intervention   Assessment shows need for Vocational Rehabilitation No      Vocational Rehab Re-Evaulation   Comments Patient is disabled and does not need voacational rehab.             Education: Education Goals: Education classes will be provided on a weekly basis, covering required topics. Participant will state understanding/return demonstration of topics presented.  Learning Barriers/Preferences:  Learning Barriers/Preferences - 08/08/22 0847       Learning Barriers/Preferences   Learning Barriers None    Learning Preferences Skilled Demonstration             Education Topics: Hypertension, Hypertension Reduction -Define heart disease and high blood pressure. Discus how high blood pressure affects the body and ways to reduce high blood pressure. Flowsheet Row CARDIAC REHAB PHASE II EXERCISE from 11/30/2021 in [place]  Date 10/19/21  Educator DM  Instruction Review Code 1- Verbalizes Understanding       Exercise and Your Heart -Discuss why it is important to exercise, the FITT principles of exercise, normal and abnormal responses to exercise, and how to exercise  safely. Flowsheet Row CARDIAC REHAB PHASE II EXERCISE from 11/30/2021 in [place]  Date 10/26/21  Educator [place]  Instruction Review Code 1- Verbalizes Understanding       Angina -Discuss definition of angina, causes of angina, treatment of angina, and how to decrease risk of having angina.   Cardiac Medications -Review what the following cardiac medications are used for, how they affect the body, and side effects that may occur when taking the medications.  Medications include Aspirin, Beta blockers, calcium channel blockers, ACE Inhibitors, angiotensin receptor blockers, diuretics, digoxin, and antihyperlipidemics. Flowsheet Row CARDIAC REHAB PHASE II EXERCISE from 11/30/2021 in [place]  Date 11/09/21  Educator DF  Instruction Review Code  2- Demonstrated Understanding       Congestive Heart Failure -Discuss the definition of CHF, how to live with CHF, the signs and symptoms of CHF, and how keep track of weight and sodium intake. Flowsheet Row CARDIAC REHAB PHASE II EXERCISE from 11/30/2021 in [place]  Date 11/16/21  Educator hj  Instruction Review Code 2- Demonstrated Understanding       Heart Disease and Intimacy -Discus the effect sexual activity has on the heart, how changes occur during intimacy as we age, and safety during sexual activity. Flowsheet Row CARDIAC REHAB PHASE II EXERCISE from 11/30/2021 in [place]  Date 11/23/21  Educator pb  Instruction Review Code 1- Verbalizes Understanding       Smoking Cessation / COPD -Discuss different methods to quit smoking, the health benefits of quitting smoking, and the definition of COPD. Flowsheet Row CARDIAC REHAB PHASE II EXERCISE from 11/30/2021 in [place]  Date 11/30/21  Educator pb  Instruction Review Code 1- Verbalizes Understanding       Nutrition I: Fats -Discuss the types of  cholesterol, what cholesterol does to the heart, and how cholesterol levels can be controlled.   Nutrition II: Labels -Discuss the different components of food labels and how to read food label   Heart Parts/Heart Disease and PAD -Discuss the anatomy of the heart, the pathway of blood circulation through the heart, and these are affected by heart disease.   Stress I: Signs and Symptoms -Discuss the causes of stress, how stress may lead to anxiety and depression, and ways to limit stress.   Stress II: Relaxation -Discuss different types of relaxation techniques to limit stress. Flowsheet Row CARDIAC REHAB PHASE II EXERCISE from 10/05/2021 in [place]  Date 10/05/21  Educator pb  Instruction Review Code 1- Verbalizes Understanding       Warning Signs of Stroke / TIA -Discuss definition of a stroke, what the signs and symptoms are of a stroke, and how to identify when someone is having stroke. Flowsheet Row CARDIAC REHAB PHASE II EXERCISE from 11/30/2021 in [place]  Date 10/12/21  Educator [place]  Instruction Review Code 1- Verbalizes Understanding       Knowledge Questionnaire Score:  Knowledge Questionnaire Score - 08/08/22 0848       Knowledge Questionnaire Score   Pre Score 21/24             Core Components/Risk Factors/Patient Goals at Admission:  Personal Goals and Risk Factors at Admission - 08/08/22 0849       Core Components/Risk Factors/Patient Goals on Admission    Weight Management Obesity;Yes    Intervention Obesity: Provide education and appropriate resources to help participant work on and attain dietary goals.    Expected Outcomes Understanding of distribution of calorie intake throughout the day with the consumption of 4-5 meals/snacks;Understanding recommendations for meals to include 15-35% energy as protein, 25-35% energy from fat, 35-60% energy from carbohydrates, less than '200mg'$  of dietary  cholesterol, 20-35 gm of total fiber daily;Long Term: Adherence to nutrition and physical activity/exercise program aimed toward attainment of established weight goal;Short Term: Continue to assess and modify interventions until short term weight is achieved    Improve shortness of breath with ADL's Yes    Intervention Provide education, individualized exercise plan and daily activity instruction to help decrease symptoms of SOB with activities of daily living.    Expected Outcomes Short Term: Improve cardiorespiratory fitness to achieve a  reduction of symptoms when performing ADLs;Long Term: Be able to perform more ADLs without symptoms or delay the onset of symptoms    Diabetes Yes    Intervention Provide education about signs/symptoms and action to take for hypo/hyperglycemia.;Provide education about proper nutrition, including hydration, and aerobic/resistive exercise prescription along with prescribed medications to achieve blood glucose in normal ranges: Fasting glucose 65-99 mg/dL    Expected Outcomes Short Term: Participant verbalizes understanding of the signs/symptoms and immediate care of hyper/hypoglycemia, proper foot care and importance of medication, aerobic/resistive exercise and nutrition plan for blood glucose control.;Long Term: Attainment of HbA1C < 7%.    Personal Goal Other Yes    Personal Goal Improve strength and stamina, be healthier    Intervention Patient will attend the CR program 3 days/week with exercise and education and supplement with exercise at home.    Expected Outcomes Patient will complete the program meeting both personal and program goals.             Core Components/Risk Factors/Patient Goals Review:    Core Components/Risk Factors/Patient Goals at Discharge (Final Review):    ITP Comments:   Comments: Patient arrived for 1st visit/orientation/education at 0800. Patient was referred to CR by Dr. Kipp Brood due to NSTEMI (I21.4) and S/P CABG x 2  (Z95.1). During orientation advised patient on arrival and appointment times what to wear, what to do before, during and after exercise. Reviewed attendance and class policy.  Pt is scheduled to return Cardiac Rehab on 08/14/2022 at 1100. Pt was advised to come to class 15 minutes before class starts.  Discussed RPE/Dpysnea scales. Patient participated in warm up stretches. Patient was able to complete 6 minute walk test.  Telemetry: sinus bradycardia with Mobitz type 1 heart block. Patient was measured for the equipment. Discussed equipment safety with patient. Took patient pre-anthropometric measurements. Patient finished visit at 0850.

## 2022-08-09 ENCOUNTER — Ambulatory Visit: Payer: Medicare Other | Admitting: Physician Assistant

## 2022-08-09 ENCOUNTER — Telehealth: Payer: Self-pay | Admitting: *Deleted

## 2022-08-09 DIAGNOSIS — N250 Renal osteodystrophy: Secondary | ICD-10-CM | POA: Diagnosis not present

## 2022-08-09 DIAGNOSIS — D631 Anemia in chronic kidney disease: Secondary | ICD-10-CM | POA: Diagnosis not present

## 2022-08-09 DIAGNOSIS — Z992 Dependence on renal dialysis: Secondary | ICD-10-CM | POA: Diagnosis not present

## 2022-08-09 DIAGNOSIS — N186 End stage renal disease: Secondary | ICD-10-CM | POA: Diagnosis not present

## 2022-08-09 DIAGNOSIS — D509 Iron deficiency anemia, unspecified: Secondary | ICD-10-CM | POA: Diagnosis not present

## 2022-08-09 NOTE — Telephone Encounter (Signed)
Received approval letter for Esomeprazole '40mg'$ . Sent copy to scan center.

## 2022-08-10 DIAGNOSIS — N186 End stage renal disease: Secondary | ICD-10-CM | POA: Diagnosis not present

## 2022-08-10 DIAGNOSIS — Z992 Dependence on renal dialysis: Secondary | ICD-10-CM | POA: Diagnosis not present

## 2022-08-10 DIAGNOSIS — N250 Renal osteodystrophy: Secondary | ICD-10-CM | POA: Diagnosis not present

## 2022-08-10 DIAGNOSIS — D509 Iron deficiency anemia, unspecified: Secondary | ICD-10-CM | POA: Diagnosis not present

## 2022-08-10 DIAGNOSIS — D631 Anemia in chronic kidney disease: Secondary | ICD-10-CM | POA: Diagnosis not present

## 2022-08-11 DIAGNOSIS — D631 Anemia in chronic kidney disease: Secondary | ICD-10-CM | POA: Diagnosis not present

## 2022-08-11 DIAGNOSIS — N186 End stage renal disease: Secondary | ICD-10-CM | POA: Diagnosis not present

## 2022-08-11 DIAGNOSIS — Z992 Dependence on renal dialysis: Secondary | ICD-10-CM | POA: Diagnosis not present

## 2022-08-11 DIAGNOSIS — N250 Renal osteodystrophy: Secondary | ICD-10-CM | POA: Diagnosis not present

## 2022-08-11 DIAGNOSIS — D509 Iron deficiency anemia, unspecified: Secondary | ICD-10-CM | POA: Diagnosis not present

## 2022-08-12 DIAGNOSIS — N186 End stage renal disease: Secondary | ICD-10-CM | POA: Diagnosis not present

## 2022-08-12 DIAGNOSIS — N250 Renal osteodystrophy: Secondary | ICD-10-CM | POA: Diagnosis not present

## 2022-08-12 DIAGNOSIS — D509 Iron deficiency anemia, unspecified: Secondary | ICD-10-CM | POA: Diagnosis not present

## 2022-08-12 DIAGNOSIS — Z992 Dependence on renal dialysis: Secondary | ICD-10-CM | POA: Diagnosis not present

## 2022-08-12 DIAGNOSIS — D631 Anemia in chronic kidney disease: Secondary | ICD-10-CM | POA: Diagnosis not present

## 2022-08-13 DIAGNOSIS — D509 Iron deficiency anemia, unspecified: Secondary | ICD-10-CM | POA: Diagnosis not present

## 2022-08-13 DIAGNOSIS — D631 Anemia in chronic kidney disease: Secondary | ICD-10-CM | POA: Diagnosis not present

## 2022-08-13 DIAGNOSIS — N250 Renal osteodystrophy: Secondary | ICD-10-CM | POA: Diagnosis not present

## 2022-08-13 DIAGNOSIS — N186 End stage renal disease: Secondary | ICD-10-CM | POA: Diagnosis not present

## 2022-08-13 DIAGNOSIS — Z992 Dependence on renal dialysis: Secondary | ICD-10-CM | POA: Diagnosis not present

## 2022-08-14 ENCOUNTER — Encounter (HOSPITAL_COMMUNITY)
Admission: RE | Admit: 2022-08-14 | Discharge: 2022-08-14 | Disposition: A | Discharge status: Home / Self Care | Payer: Medicare Other | Source: Ambulatory Visit | Attending: Internal Medicine | Admitting: Internal Medicine

## 2022-08-14 VITALS — Wt 219.1 lb

## 2022-08-14 DIAGNOSIS — D631 Anemia in chronic kidney disease: Secondary | ICD-10-CM | POA: Diagnosis not present

## 2022-08-14 DIAGNOSIS — Z992 Dependence on renal dialysis: Secondary | ICD-10-CM | POA: Diagnosis not present

## 2022-08-14 DIAGNOSIS — I214 Non-ST elevation (NSTEMI) myocardial infarction: Secondary | ICD-10-CM

## 2022-08-14 DIAGNOSIS — N250 Renal osteodystrophy: Secondary | ICD-10-CM | POA: Diagnosis not present

## 2022-08-14 DIAGNOSIS — Z951 Presence of aortocoronary bypass graft: Secondary | ICD-10-CM | POA: Diagnosis not present

## 2022-08-14 DIAGNOSIS — D509 Iron deficiency anemia, unspecified: Secondary | ICD-10-CM | POA: Diagnosis not present

## 2022-08-14 DIAGNOSIS — N186 End stage renal disease: Secondary | ICD-10-CM | POA: Diagnosis not present

## 2022-08-14 NOTE — Progress Notes (Signed)
Daily Session Note  Patient Details  Name: [name] [name] MRN: [id number] Date of Birth: 1950/09/06 Referring Provider:   Flowsheet Row CARDIAC REHAB PHASE II ORIENTATION from 08/08/2022 in [place]  Referring Provider Dr. Kipp Brood       Encounter Date: 08/14/2022  Check In:  Session Check In - 08/14/22 1100       Check-In   Supervising physician immediately available to respond to emergencies [facility] MD immediately available    Physician(s) Dr. Dellia Cloud    Location [facility]    Staff Present Leana Roe, BS, Exercise Physiologist;Dalton Sherrie George, MS, ACSM-CEP;Mrytle Bento Wynetta Emery, RN, BSN    Virtual Visit No    Medication changes reported     No    Fall or balance concerns reported    Yes    Comments He has not fallen, but he is currently walking with a cain due to poor balance and deconditioning.    Tobacco Cessation No Change    Warm-up and Cool-down Performed as group-led instruction    Resistance Training Performed Yes    VAD Patient? No    PAD/SET Patient? No      Pain Assessment   Currently in Pain? No/denies    Pain Score 0-No pain    Multiple Pain Sites No             Capillary Blood Glucose: No results found for this or any previous visit (from the past 24 hour(s)).    Social History   Tobacco Use  Smoking Status Former   Packs/day: 1.00   Years: 25.00   Total pack years: 25.00   Types: Cigarettes   Quit date: 03/27/2010   Years since quitting: 12.3  Smokeless Tobacco Never  Tobacco Comments   Quit x 7 years    Goals Met:  Independence with exercise equipment Exercise tolerated well No report of concerns or symptoms today Strength training completed today  Goals Unmet:  Not Applicable  Comments: Check out 1200.   Dr. Carlyle Dolly is Medical Director for [facility]

## 2022-08-15 DIAGNOSIS — D631 Anemia in chronic kidney disease: Secondary | ICD-10-CM | POA: Diagnosis not present

## 2022-08-15 DIAGNOSIS — E1142 Type 2 diabetes mellitus with diabetic polyneuropathy: Secondary | ICD-10-CM | POA: Diagnosis not present

## 2022-08-15 DIAGNOSIS — N186 End stage renal disease: Secondary | ICD-10-CM | POA: Diagnosis not present

## 2022-08-15 DIAGNOSIS — D509 Iron deficiency anemia, unspecified: Secondary | ICD-10-CM | POA: Diagnosis not present

## 2022-08-15 DIAGNOSIS — N250 Renal osteodystrophy: Secondary | ICD-10-CM | POA: Diagnosis not present

## 2022-08-15 DIAGNOSIS — Z992 Dependence on renal dialysis: Secondary | ICD-10-CM | POA: Diagnosis not present

## 2022-08-16 ENCOUNTER — Encounter (HOSPITAL_COMMUNITY)
Admission: RE | Admit: 2022-08-16 | Discharge: 2022-08-16 | Disposition: A | Discharge status: Home / Self Care | Payer: Medicare Other | Source: Ambulatory Visit | Attending: Internal Medicine | Admitting: Internal Medicine

## 2022-08-16 DIAGNOSIS — Z951 Presence of aortocoronary bypass graft: Secondary | ICD-10-CM | POA: Diagnosis not present

## 2022-08-16 DIAGNOSIS — N250 Renal osteodystrophy: Secondary | ICD-10-CM | POA: Diagnosis not present

## 2022-08-16 DIAGNOSIS — N186 End stage renal disease: Secondary | ICD-10-CM | POA: Diagnosis not present

## 2022-08-16 DIAGNOSIS — Z992 Dependence on renal dialysis: Secondary | ICD-10-CM | POA: Diagnosis not present

## 2022-08-16 DIAGNOSIS — I214 Non-ST elevation (NSTEMI) myocardial infarction: Secondary | ICD-10-CM

## 2022-08-16 DIAGNOSIS — D509 Iron deficiency anemia, unspecified: Secondary | ICD-10-CM | POA: Diagnosis not present

## 2022-08-16 DIAGNOSIS — D631 Anemia in chronic kidney disease: Secondary | ICD-10-CM | POA: Diagnosis not present

## 2022-08-16 NOTE — Progress Notes (Signed)
Daily Session Note  Patient Details  Name: [name] [name] MRN: [id number] Date of Birth: Nov 07, 1950 Referring Provider:   Flowsheet Row CARDIAC REHAB PHASE II ORIENTATION from 08/08/2022 in [place]  Referring Provider Dr. Kipp Brood       Encounter Date: 08/16/2022  Check In:  Session Check In - 08/16/22 1100       Check-In   Supervising physician immediately available to respond to emergencies [facility] MD immediately available    Physician(s) Dr. Dellia Cloud    Location [facility]    Staff Present Hoy Register MHA, MS, ACSM-CEP;Derrious Bologna Wynetta Emery, RN, Joanette Gula, RN, BSN;Heather Mel Almond, BS, Exercise Physiologist    Virtual Visit No    Medication changes reported     No    Fall or balance concerns reported    Yes    Comments He has not fallen, but he is currently walking with a cain due to poor balance and deconditioning.    Tobacco Cessation No Change    Warm-up and Cool-down Performed as group-led instruction    Resistance Training Performed Yes    VAD Patient? No    PAD/SET Patient? No      Pain Assessment   Currently in Pain? No/denies    Pain Score 0-No pain    Multiple Pain Sites No             Capillary Blood Glucose: No results found for this or any previous visit (from the past 24 hour(s)).    Social History   Tobacco Use  Smoking Status Former   Packs/day: 1.00   Years: 25.00   Total pack years: 25.00   Types: Cigarettes   Quit date: 03/27/2010   Years since quitting: 12.3  Smokeless Tobacco Never  Tobacco Comments   Quit x 7 years    Goals Met:  Independence with exercise equipment Exercise tolerated well No report of concerns or symptoms today Strength training completed today  Goals Unmet:  Not Applicable  Comments: Check out 1200.   Dr. Carlyle Dolly is Medical Director for [facility]

## 2022-08-16 NOTE — Progress Notes (Signed)
Cardiac Individual Treatment Plan  Patient Details  Name: [name] [name] MRN: [id number] Date of Birth: 01-Jan-1951 Referring Provider:   Flowsheet Row CARDIAC REHAB PHASE II ORIENTATION from 08/08/2022 in [facility]  Referring Provider Dr. Kipp Brood       Initial Encounter Date:  Flowsheet Row CARDIAC REHAB PHASE II ORIENTATION from 08/08/2022 in [facility]  Date 08/08/22       Visit Diagnosis: NSTEMI (non-ST elevated myocardial infarction) ([place])  S/P CABG x 2  Patient's Home Medications on Admission:  Current Outpatient Medications:    albuterol (VENTOLIN HFA) 108 (90 Base) MCG/ACT inhaler, Inhale 2 puffs into the lungs every 6 (six) hours as needed for wheezing or shortness of breath., Disp: 18 g, Rfl: 11   aspirin EC 81 MG tablet, Take 81 mg by mouth daily. Swallow whole., Disp: , Rfl:    atorvastatin (LIPITOR) 80 MG tablet, Take 1 tablet (80 mg total) by mouth daily., Disp: 30 tablet, Rfl: 1   calcitRIOL (ROCALTROL) 0.25 MCG capsule, Take 0.25 mcg by mouth daily., Disp: , Rfl:    clopidogrel (PLAVIX) 75 MG tablet, Take 1 tablet (75 mg total) by mouth daily. Please take first dose at 9 pm on 12/4, Disp: 30 tablet, Rfl: 1   DROPLET PEN NEEDLES 31G X 5 MM MISC, USE TO INJECT INSULIN DAILY AS DIRECTED, Disp: 100 each, Rfl: 1   esomeprazole (NEXIUM) 40 MG capsule, Take 1 capsule (40 mg total) by mouth 2 (two) times daily before a meal., Disp: 180 capsule, Rfl: 1   insulin glargine (LANTUS SOLOSTAR) 100 UNIT/ML Solostar Pen, Inject 22 Units into the skin at bedtime., Disp: 18 mL, Rfl: 3   linagliptin (TRADJENTA) 5 MG TABS tablet, Take 1 tablet (5 mg total) by mouth daily., Disp: 90 tablet, Rfl: 3   midodrine (PROAMATINE) 5 MG tablet, Take 3 tablets (15 mg total) by mouth 3 (three) times daily with meals., Disp: 90 tablet, Rfl: 1   ONETOUCH ULTRA test strip, USE TO CHECK BLOOD SUGAR TWICE DAILY, Disp: 200 strip, Rfl: 2   sevelamer  (RENAGEL) 800 MG tablet, Take 2 tablets (1,600 mg total) by mouth 3 (three) times daily with meals., Disp: 180 tablet, Rfl: 1   Vitamin D, Ergocalciferol, (DRISDOL) 1.25 MG (50000 UNIT) CAPS capsule, Take 50,000 Units by mouth every 7 (seven) days. Monday, Disp: , Rfl:   Past Medical History: Past Medical History:  Diagnosis Date   Anemia    Arthritis    Asthma    Bell palsy    CAD (coronary artery disease)    a. 2014 MV: abnl w/ infap ischemia; b. 03/2013 Cath: aneurysmal bleb in the LAD w/ otw nonobs dzs-->Med Rx.   Chronic back pain    Chronic knee pain    a. 09/2015 s/p R TKA.   Chronic pain    Chronic shoulder pain    Chronic sinusitis    COPD (chronic obstructive pulmonary disease) ([place])    Diabetes mellitus without complication ([place])    type II    ESRD on peritoneal dialysis ([place])    on peritoneal dialysis, [facility]   Essential hypertension    GERD (gastroesophageal reflux disease)    Gout    Gout    Hepatomegaly    noted on noncontrast CT 2015   History of hiatal hernia    Hyperlipidemia    Lateral meniscus tear    Obesity    Truncal   Obstructive sleep apnea  does not use cpap    On home oxygen therapy    uses 2l when is going somewhere per patient    PUD (peptic ulcer disease)    remote, reports f/u EGD about 8 years ago unremarkable    Reactive airway disease    related to exposure to chemical during 9/11   Sinusitis    Vitamin D deficiency     Tobacco Use: Social History   Tobacco Use  Smoking Status Former   Packs/day: 1.00   Years: 25.00   Total pack years: 25.00   Types: Cigarettes   Quit date: 03/27/2010   Years since quitting: 12.3  Smokeless Tobacco Never  Tobacco Comments   Quit x 7 years    Labs: Review Flowsheet  More data exists      Latest Ref Rng & Units 05/30/2022 06/03/2022 06/05/2022 06/06/2022 06/07/2022  Labs for ITP Cardiac and Pulmonary Rehab  Cholestrol 0 - 200 mg/dL - 88  - - -  LDL (calc) 0 - 99 mg/dL - 36   - - -  HDL-C >40 mg/dL - 32  - - -  Trlycerides <150 mg/dL - 98  - - -  Hemoglobin A1c 4.8 - 5.6 % 5.9  - - - -  PH, Arterial 7.35 - 7.45 - - 7.323  7.387  7.374  7.345  7.287  7.353  7.335  7.372  7.373  7.336   PCO2 arterial 32 - 48 mmHg - - 46.0  41.0  45.2  29.5  33.7  29.9  40.1  45.6  39.3  25.2   Bicarbonate 20.0 - 28.0 mmol/L 28.3  28.4  26.7  - 24.4  24.7  26.3  16.1  16.0  16.5  21.4  26.4  23.0  13.5   TCO2 22 - 32 mmol/L '30  30  28  '$ - '26  22  26  26  28  28  17  17  17  23  28  24  14   '$ Acid-base deficit 0.0 - 2.0 mmol/L - - 2.0  9.0  10.0  8.0  4.0  2.0  11.0   O2 Saturation % 65  63  97  - 94  99  100  98  91  99  90  99  98  98     Capillary Blood Glucose: Lab Results  Component Value Date   GLUCAP 118 (H) 08/08/2022   GLUCAP 124 (H) 06/19/2022   GLUCAP 134 (H) 06/19/2022   GLUCAP 158 (H) 06/18/2022   GLUCAP 128 (H) 06/18/2022    POCT Glucose     Row Name 08/08/22 0835             POCT Blood Glucose   Pre-Exercise 118 mg/dL                Exercise Target Goals: Exercise Program Goal: Individual exercise prescription set using results from initial 6 min walk test and THRR while considering  patient's activity barriers and safety.   Exercise Prescription Goal: Starting with aerobic activity 30 plus minutes a day, 3 days per week for initial exercise prescription. Provide home exercise prescription and guidelines that participant acknowledges understanding prior to discharge.  Activity Barriers & Risk Stratification:  Activity Barriers & Cardiac Risk Stratification - 08/08/22 0844       Activity Barriers & Cardiac Risk Stratification   Activity Barriers Arthritis;Back Problems;Neck/Spine Problems;Joint Problems;Deconditioning;Muscular Weakness;Shortness of Breath;Incisional Pain;Balance Concerns;Assistive Device    Cardiac Risk Stratification  High             6 Minute Walk:  6 Minute Walk     Row Name 08/08/22 1000         6 Minute Walk    Phase Initial     Distance 1000 feet     Walk Time 6 minutes     # of Rest Breaks 0     MPH 1.89     METS 1.8     RPE 13     VO2 Peak 6.31     Symptoms No     Resting HR 55 bpm     Resting BP 138/80     Resting Oxygen Saturation  95 %     Exercise Oxygen Saturation  during 6 min walk 96 %     Max Ex. HR 90 bpm     Max Ex. BP 150/80     2 Minute Post BP 130/80              Oxygen Initial Assessment:   Oxygen Re-Evaluation:   Oxygen Discharge (Final Oxygen Re-Evaluation):   Initial Exercise Prescription:  Initial Exercise Prescription - 08/08/22 1000       Date of Initial Exercise RX and Referring Provider   Date 08/08/22    Referring Provider Dr. Kipp Brood    Expected Discharge Date 11/03/22      Treadmill   MPH 1    Grade 0    Minutes 17      NuStep   Level 1    SPM 80    Minutes 22      Prescription Details   Frequency (times per week) 3    Duration Progress to 30 minutes of continuous aerobic without signs/symptoms of physical distress      Intensity   THRR 40-80% of Max Heartrate 59-118    Ratings of Perceived Exertion 11-13      Resistance Training   Training Prescription Yes    Weight 4    Reps 10-15             Perform Capillary Blood Glucose checks as needed.  Exercise Prescription Changes:   Exercise Prescription Changes     Row Name 08/14/22 1600             Response to Exercise   Blood Pressure (Admit) 128/80       Blood Pressure (Exercise) 152/68       Blood Pressure (Exit) 130/80       Heart Rate (Admit) 49 bpm       Heart Rate (Exercise) 77 bpm       Heart Rate (Exit) 58 bpm       Rating of Perceived Exertion (Exercise) 11       Duration Continue with 30 min of aerobic exercise without signs/symptoms of physical distress.       Intensity THRR unchanged         Progression   Progression Continue to progress workloads to maintain intensity without signs/symptoms of physical distress.         Resistance Training    Training Prescription Yes       Weight 3       Reps 10-15       Time 10 Minutes         Treadmill   MPH 1.1       Grade 0       Minutes 17       METs  1.84         NuStep   Level 1       SPM 82       Minutes 22       METs 2.08                Exercise Comments:   Exercise Goals and Review:   Exercise Goals     Row Name 08/08/22 1002 08/14/22 1601           Exercise Goals   Increase Physical Activity Yes Yes      Intervention Provide advice, education, support and counseling about physical activity/exercise needs.;Develop an individualized exercise prescription for aerobic and resistive training based on initial evaluation findings, risk stratification, comorbidities and participant's personal goals. Provide advice, education, support and counseling about physical activity/exercise needs.;Develop an individualized exercise prescription for aerobic and resistive training based on initial evaluation findings, risk stratification, comorbidities and participant's personal goals.      Expected Outcomes Short Term: Attend rehab on a regular basis to increase amount of physical activity.;Long Term: Add in home exercise to make exercise part of routine and to increase amount of physical activity.;Long Term: Exercising regularly at least 3-5 days a week. Short Term: Attend rehab on a regular basis to increase amount of physical activity.;Long Term: Add in home exercise to make exercise part of routine and to increase amount of physical activity.;Long Term: Exercising regularly at least 3-5 days a week.      Increase Strength and Stamina Yes Yes      Intervention Provide advice, education, support and counseling about physical activity/exercise needs.;Develop an individualized exercise prescription for aerobic and resistive training based on initial evaluation findings, risk stratification, comorbidities and participant's personal goals. Provide advice, education, support and counseling  about physical activity/exercise needs.;Develop an individualized exercise prescription for aerobic and resistive training based on initial evaluation findings, risk stratification, comorbidities and participant's personal goals.      Expected Outcomes Short Term: Increase workloads from initial exercise prescription for resistance, speed, and METs.;Short Term: Perform resistance training exercises routinely during rehab and add in resistance training at home;Long Term: Improve cardiorespiratory fitness, muscular endurance and strength as measured by increased METs and functional capacity (6MWT) Short Term: Increase workloads from initial exercise prescription for resistance, speed, and METs.;Short Term: Perform resistance training exercises routinely during rehab and add in resistance training at home;Long Term: Improve cardiorespiratory fitness, muscular endurance and strength as measured by increased METs and functional capacity (6MWT)      Able to understand and use rate of perceived exertion (RPE) scale Yes Yes      Intervention Provide education and explanation on how to use RPE scale Provide education and explanation on how to use RPE scale      Expected Outcomes Short Term: Able to use RPE daily in rehab to express subjective intensity level;Long Term:  Able to use RPE to guide intensity level when exercising independently Short Term: Able to use RPE daily in rehab to express subjective intensity level;Long Term:  Able to use RPE to guide intensity level when exercising independently      Knowledge and understanding of Target Heart Rate Range (THRR) Yes Yes      Intervention Provide education and explanation of THRR including how the numbers were predicted and where they are located for reference Provide education and explanation of THRR including how the numbers were predicted and where they are located for reference      Expected Outcomes  Short Term: Able to state/look up THRR;Long Term: Able to use  THRR to govern intensity when exercising independently;Short Term: Able to use daily as guideline for intensity in rehab Short Term: Able to state/look up THRR;Long Term: Able to use THRR to govern intensity when exercising independently;Short Term: Able to use daily as guideline for intensity in rehab      Able to check pulse independently Yes --      Intervention Provide education and demonstration on how to check pulse in carotid and radial arteries.;Review the importance of being able to check your own pulse for safety during independent exercise Provide education and demonstration on how to check pulse in carotid and radial arteries.;Review the importance of being able to check your own pulse for safety during independent exercise      Expected Outcomes Short Term: Able to explain why pulse checking is important during independent exercise;Long Term: Able to check pulse independently and accurately Short Term: Able to explain why pulse checking is important during independent exercise;Long Term: Able to check pulse independently and accurately      Understanding of Exercise Prescription Yes Yes      Intervention Provide education, explanation, and written materials on patient's individual exercise prescription Provide education, explanation, and written materials on patient's individual exercise prescription      Expected Outcomes Short Term: Able to explain program exercise prescription;Long Term: Able to explain home exercise prescription to exercise independently Short Term: Able to explain program exercise prescription;Long Term: Able to explain home exercise prescription to exercise independently               Exercise Goals Re-Evaluation :  Exercise Goals Re-Evaluation     Row Name 08/14/22 1602             Exercise Goal Re-Evaluation   Exercise Goals Review Increase Strength and Stamina;Increase Physical Activity;Able to understand and use rate of perceived exertion (RPE)  scale;Knowledge and understanding of Target Heart Rate Range (THRR);Able to check pulse independently;Understanding of Exercise Prescription       Comments Pt just completed his first session of CR. He has been through the program before and is egar to get started to improve his strength. He is currently exercising at 2.08 METs on the stepper. Will continue to monitor and progress as able.       Expected Outcomes Through exercise at home and at rehab, the patient will meet their stated goals.                 Discharge Exercise Prescription (Final Exercise Prescription Changes):  Exercise Prescription Changes - 08/14/22 1600       Response to Exercise   Blood Pressure (Admit) 128/80    Blood Pressure (Exercise) 152/68    Blood Pressure (Exit) 130/80    Heart Rate (Admit) 49 bpm    Heart Rate (Exercise) 77 bpm    Heart Rate (Exit) 58 bpm    Rating of Perceived Exertion (Exercise) 11    Duration Continue with 30 min of aerobic exercise without signs/symptoms of physical distress.    Intensity THRR unchanged      Progression   Progression Continue to progress workloads to maintain intensity without signs/symptoms of physical distress.      Resistance Training   Training Prescription Yes    Weight 3    Reps 10-15    Time 10 Minutes      Treadmill   MPH 1.1    Grade 0  Minutes 17    METs 1.84      NuStep   Level 1    SPM 82    Minutes 22    METs 2.08             Nutrition:  Target Goals: Understanding of nutrition guidelines, daily intake of sodium '1500mg'$ , cholesterol '200mg'$ , calories 30% from fat and 7% or less from saturated fats, daily to have 5 or more servings of fruits and vegetables.  Biometrics:  Pre Biometrics - 08/08/22 1003       Pre Biometrics   Height '5\' 4"'$  (1.626 m)    Weight 94.6 kg    Waist Circumference 46 inches    Hip Circumference 45 inches    Waist to Hip Ratio 1.02 %    BMI (Calculated) 35.78    Triceps Skinfold 25 mm    % Body  Fat 36.3 %    Grip Strength 31.5 kg    Flexibility 0 in    Single Leg Stand 0 seconds              Nutrition Therapy Plan and Nutrition Goals:  Nutrition Therapy & Goals - 08/08/22 0826       Personal Nutrition Goals   Comments Pt scored a 19 on MEDFICTS. He follows a low NA, diabetic, and renal diet.      Intervention Plan   Intervention Nutrition handout(s) given to patient.    Expected Outcomes Short Term Goal: Understand basic principles of dietary content, such as calories, fat, sodium, cholesterol and nutrients.             Nutrition Assessments:  Nutrition Assessments - 08/08/22 0827       MEDFICTS Scores   Pre Score 19            MEDIFICTS Score Key: ?70 Need to make dietary changes  40-70 Heart Healthy Diet ? 40 Therapeutic Level Cholesterol Diet   Picture Your Plate Scores: <69 Unhealthy dietary pattern with much room for improvement. 41-50 Dietary pattern unlikely to meet recommendations for good health and room for improvement. 51-60 More healthful dietary pattern, with some room for improvement.  >60 Healthy dietary pattern, although there may be some specific behaviors that could be improved.    Nutrition Goals Re-Evaluation:   Nutrition Goals Discharge (Final Nutrition Goals Re-Evaluation):   Psychosocial: Target Goals: Acknowledge presence or absence of significant depression and/or stress, maximize coping skills, provide positive support system. Participant is able to verbalize types and ability to use techniques and skills needed for reducing stress and depression.  Initial Review & Psychosocial Screening:  Initial Psych Review & Screening - 08/08/22 0845       Initial Review   Current issues with None Identified      Family Dynamics   Good Support System? Yes    Comments His wife is his support system.      Barriers   Psychosocial barriers to participate in program There are no identifiable barriers or psychosocial needs.       Screening Interventions   Interventions Encouraged to exercise;Provide feedback about the scores to participant    Expected Outcomes Long Term goal: The participant improves quality of Life and PHQ9 Scores as seen by post scores and/or verbalization of changes;Short Term goal: Identification and review with participant of any Quality of Life or Depression concerns found by scoring the questionnaire.             Quality of Life Scores:  Quality  of Life - 08/08/22 1003       Quality of Life   Select Quality of Life      Quality of Life Scores   Health/Function Pre 14.8 %    Socioeconomic Pre 23.07 %    Psych/Spiritual Pre 26.57 %    Family Pre 30 %    GLOBAL Pre 21.78 %            Scores of 19 and below usually indicate a poorer quality of life in these areas.  A difference of  2-3 points is a clinically meaningful difference.  A difference of 2-3 points in the total score of the Quality of Life Index has been associated with significant improvement in overall quality of life, self-image, physical symptoms, and general health in studies assessing change in quality of life.  PHQ-9: Review Flowsheet  More data exists      08/08/2022 02/01/2022 12/29/2021 09/29/2021 03/07/2018  Depression screen PHQ 2/9  Decreased Interest 0 0 0 0 0  Down, Depressed, Hopeless 1 0 1 0 -  PHQ - 2 Score 1 0 1 0 0  Altered sleeping 0 - - 0 -  Tired, decreased energy 3 - - 3 -  Change in appetite 2 - - 1 -  Feeling bad or failure about yourself  0 - - 2 -  Trouble concentrating 0 - - 1 -  Moving slowly or fidgety/restless 0 - - 0 -  Suicidal thoughts 0 - - 0 -  PHQ-9 Score 6 - - 7 -  Difficult doing work/chores Somewhat difficult - - Somewhat difficult -   Interpretation of Total Score  Total Score Depression Severity:  1-4 = Minimal depression, 5-9 = Mild depression, 10-14 = Moderate depression, 15-19 = Moderately severe depression, 20-27 = Severe depression   Psychosocial Evaluation and  Intervention:  Psychosocial Evaluation - 08/08/22 0911       Psychosocial Evaluation & Interventions   Interventions Stress management education;Relaxation education;Encouraged to exercise with the program and follow exercise prescription    Comments Pt has no barriers to participating in CR. He has no identiable psychosocial issues. He scored a 6 on a PHQ-9 and this relates to his chronic health conditions. He denies any depression. He reports that 2023 was a rough year for him. He had coronary stents placed in January, June, and July. He then had a CABG x 2 on his birthday in November. He reports he has felt weak since his surgery, and now he has to walk with a cane due to this. He is optimistic that 2024 will be a better year. He continues to cope well with his pertioneal dialysis that he has been undergoing for about 7 years now. He lists his wife as his support system. His goals while in the program are to increase his strength and stamina and to be healthier. He previously completed 29 sessions of CR, but he was discharged in early June of 2023 due to ongoing anginal symptoms. He is excited to be back in the program and to get his strength back.    Expected Outcomes Patient will continue to have no psychosocial barriers or issues identified.    Continue Psychosocial Services  No Follow up required             Psychosocial Re-Evaluation:   Psychosocial Discharge (Final Psychosocial Re-Evaluation):   Vocational Rehabilitation: Provide vocational rehab assistance to qualifying candidates.   Vocational Rehab Evaluation & Intervention:  Vocational Rehab - 08/08/22 1761  Initial Vocational Rehab Evaluation & Intervention   Assessment shows need for Vocational Rehabilitation No      Vocational Rehab Re-Evaulation   Comments Patient is disabled and does not need voacational rehab.             Education: Education Goals: Education classes will be provided on a weekly  basis, covering required topics. Participant will state understanding/return demonstration of topics presented.  Learning Barriers/Preferences:  Learning Barriers/Preferences - 08/08/22 0847       Learning Barriers/Preferences   Learning Barriers None    Learning Preferences Skilled Demonstration             Education Topics: Hypertension, Hypertension Reduction -Define heart disease and high blood pressure. Discus how high blood pressure affects the body and ways to reduce high blood pressure. Flowsheet Row CARDIAC REHAB PHASE II EXERCISE from 11/30/2021 in [facility]  Date 10/19/21  Educator DM  Instruction Review Code 1- Verbalizes Understanding       Exercise and Your Heart -Discuss why it is important to exercise, the FITT principles of exercise, normal and abnormal responses to exercise, and how to exercise safely. Flowsheet Row CARDIAC REHAB PHASE II EXERCISE from 11/30/2021 in [facility]  Date 10/26/21  Educator [place]  Instruction Review Code 1- Verbalizes Understanding       Angina -Discuss definition of angina, causes of angina, treatment of angina, and how to decrease risk of having angina.   Cardiac Medications -Review what the following cardiac medications are used for, how they affect the body, and side effects that may occur when taking the medications.  Medications include Aspirin, Beta blockers, calcium channel blockers, ACE Inhibitors, angiotensin receptor blockers, diuretics, digoxin, and antihyperlipidemics. Flowsheet Row CARDIAC REHAB PHASE II EXERCISE from 11/30/2021 in [facility]  Date 11/09/21  Educator DF  Instruction Review Code 2- Demonstrated Understanding       Congestive Heart Failure -Discuss the definition of CHF, how to live with CHF, the signs and symptoms of CHF, and how keep track of weight and sodium intake. Flowsheet Row CARDIAC REHAB PHASE II EXERCISE from  11/30/2021 in [place]  Date 11/16/21  Educator hj  Instruction Review Code 2- Demonstrated Understanding       Heart Disease and Intimacy -Discus the effect sexual activity has on the heart, how changes occur during intimacy as we age, and safety during sexual activity. Flowsheet Row CARDIAC REHAB PHASE II EXERCISE from 11/30/2021 in [place]  Date 11/23/21  Educator pb  Instruction Review Code 1- Verbalizes Understanding       Smoking Cessation / COPD -Discuss different methods to quit smoking, the health benefits of quitting smoking, and the definition of COPD. Flowsheet Row CARDIAC REHAB PHASE II EXERCISE from 11/30/2021 in [facility]  Date 11/30/21  Educator pb  Instruction Review Code 1- Verbalizes Understanding       Nutrition I: Fats -Discuss the types of cholesterol, what cholesterol does to the heart, and how cholesterol levels can be controlled.   Nutrition II: Labels -Discuss the different components of food labels and how to read food label   Heart Parts/Heart Disease and PAD -Discuss the anatomy of the heart, the pathway of blood circulation through the heart, and these are affected by heart disease.   Stress I: Signs and Symptoms -Discuss the causes of stress, how stress may lead to anxiety and depression, and ways to limit stress.   Stress  II: Relaxation -Discuss different types of relaxation techniques to limit stress. Flowsheet Row CARDIAC REHAB PHASE II EXERCISE from 10/05/2021 in [place]  Date 10/05/21  Educator pb  Instruction Review Code 1- Verbalizes Understanding       Warning Signs of Stroke / TIA -Discuss definition of a stroke, what the signs and symptoms are of a stroke, and how to identify when someone is having stroke. Flowsheet Row CARDIAC REHAB PHASE II EXERCISE from 11/30/2021 in [place]  Date 10/12/21   Educator [place]  Instruction Review Code 1- Verbalizes Understanding       Knowledge Questionnaire Score:  Knowledge Questionnaire Score - 08/08/22 0848       Knowledge Questionnaire Score   Pre Score 21/24             Core Components/Risk Factors/Patient Goals at Admission:  Personal Goals and Risk Factors at Admission - 08/08/22 0849       Core Components/Risk Factors/Patient Goals on Admission    Weight Management Obesity;Yes    Intervention Obesity: Provide education and appropriate resources to help participant work on and attain dietary goals.    Expected Outcomes Understanding of distribution of calorie intake throughout the day with the consumption of 4-5 meals/snacks;Understanding recommendations for meals to include 15-35% energy as protein, 25-35% energy from fat, 35-60% energy from carbohydrates, less than '200mg'$  of dietary cholesterol, 20-35 gm of total fiber daily;Long Term: Adherence to nutrition and physical activity/exercise program aimed toward attainment of established weight goal;Short Term: Continue to assess and modify interventions until short term weight is achieved    Improve shortness of breath with ADL's Yes    Intervention Provide education, individualized exercise plan and daily activity instruction to help decrease symptoms of SOB with activities of daily living.    Expected Outcomes Short Term: Improve cardiorespiratory fitness to achieve a reduction of symptoms when performing ADLs;Long Term: Be able to perform more ADLs without symptoms or delay the onset of symptoms    Diabetes Yes    Intervention Provide education about signs/symptoms and action to take for hypo/hyperglycemia.;Provide education about proper nutrition, including hydration, and aerobic/resistive exercise prescription along with prescribed medications to achieve blood glucose in normal ranges: Fasting glucose 65-99 mg/dL    Expected Outcomes Short Term: Participant verbalizes understanding  of the signs/symptoms and immediate care of hyper/hypoglycemia, proper foot care and importance of medication, aerobic/resistive exercise and nutrition plan for blood glucose control.;Long Term: Attainment of HbA1C < 7%.    Personal Goal Other Yes    Personal Goal Improve strength and stamina, be healthier    Intervention Patient will attend the CR program 3 days/week with exercise and education and supplement with exercise at home.    Expected Outcomes Patient will complete the program meeting both personal and program goals.             Core Components/Risk Factors/Patient Goals Review:   Goals and Risk Factor Review     Row Name 08/09/22 1040             Core Components/Risk Factors/Patient Goals Review   Personal Goals Review Weight Management/Obesity;Diabetes;Improve shortness of breath with ADL's;Other       Review Patient referred to CR with NSTEMI/CABGx2. He has multiple risk factors for CAD and is participating in the program again for risk modification. He was not able to complete the program due chest pain. He plans to start the program next week. His DM is managed with insulin. His  last A1C on file was 01/13/22 at 6.3. His personal goals for the program are to increase his strength and stamina and to be healthier overall.       Expected Outcomes Patient will complete the program meeting both program and personal goals.                Core Components/Risk Factors/Patient Goals at Discharge (Final Review):   Goals and Risk Factor Review - 08/09/22 1040       Core Components/Risk Factors/Patient Goals Review   Personal Goals Review Weight Management/Obesity;Diabetes;Improve shortness of breath with ADL's;Other    Review Patient referred to CR with NSTEMI/CABGx2. He has multiple risk factors for CAD and is participating in the program again for risk modification. He was not able to complete the program due chest pain. He plans to start the program next week. His DM is  managed with insulin. His last A1C on file was 01/13/22 at 6.3. His personal goals for the program are to increase his strength and stamina and to be healthier overall.    Expected Outcomes Patient will complete the program meeting both program and personal goals.             ITP Comments:   Comments: ITP REVIEW Pt is making expected progress toward Cardiac Rehab goals after completing 2 sessions. Recommend continued exercise, life style modification, education, and increased stamina and strength.

## 2022-08-17 DIAGNOSIS — D509 Iron deficiency anemia, unspecified: Secondary | ICD-10-CM | POA: Diagnosis not present

## 2022-08-17 DIAGNOSIS — N250 Renal osteodystrophy: Secondary | ICD-10-CM | POA: Diagnosis not present

## 2022-08-17 DIAGNOSIS — D631 Anemia in chronic kidney disease: Secondary | ICD-10-CM | POA: Diagnosis not present

## 2022-08-17 DIAGNOSIS — N186 End stage renal disease: Secondary | ICD-10-CM | POA: Diagnosis not present

## 2022-08-17 DIAGNOSIS — Z992 Dependence on renal dialysis: Secondary | ICD-10-CM | POA: Diagnosis not present

## 2022-08-18 ENCOUNTER — Encounter (HOSPITAL_COMMUNITY)
Admission: RE | Admit: 2022-08-18 | Discharge: 2022-08-18 | Disposition: A | Discharge status: Home / Self Care | Payer: Medicare Other | Source: Ambulatory Visit | Attending: Internal Medicine | Admitting: Internal Medicine

## 2022-08-18 DIAGNOSIS — Z955 Presence of coronary angioplasty implant and graft: Secondary | ICD-10-CM | POA: Diagnosis not present

## 2022-08-18 DIAGNOSIS — Z951 Presence of aortocoronary bypass graft: Secondary | ICD-10-CM | POA: Diagnosis not present

## 2022-08-18 DIAGNOSIS — N186 End stage renal disease: Secondary | ICD-10-CM | POA: Diagnosis not present

## 2022-08-18 DIAGNOSIS — N250 Renal osteodystrophy: Secondary | ICD-10-CM | POA: Diagnosis not present

## 2022-08-18 DIAGNOSIS — Z992 Dependence on renal dialysis: Secondary | ICD-10-CM | POA: Diagnosis not present

## 2022-08-18 DIAGNOSIS — I214 Non-ST elevation (NSTEMI) myocardial infarction: Secondary | ICD-10-CM | POA: Insufficient documentation

## 2022-08-18 DIAGNOSIS — Z9861 Coronary angioplasty status: Secondary | ICD-10-CM | POA: Insufficient documentation

## 2022-08-18 DIAGNOSIS — D509 Iron deficiency anemia, unspecified: Secondary | ICD-10-CM | POA: Diagnosis not present

## 2022-08-18 DIAGNOSIS — D631 Anemia in chronic kidney disease: Secondary | ICD-10-CM | POA: Diagnosis not present

## 2022-08-18 NOTE — Progress Notes (Signed)
Daily Session Note  Patient Details  Name: [name] [name] MRN: [id number] Date of Birth: 1950/10/12 Referring Provider:   Flowsheet Row CARDIAC REHAB PHASE II ORIENTATION from 08/08/2022 in [place]  Referring Provider Dr. Kipp Brood       Encounter Date: 08/18/2022  Check In:  Session Check In - 08/18/22 1054       Check-In   Supervising physician immediately available to respond to emergencies [facility] MD immediately available    Physician(s) Dr. Dellia Cloud    Location [facility]    Staff Present Hoy Register MHA, MS, ACSM-CEP;Andora Krull Wynetta Emery, RN, Joanette Gula, RN, BSN    Virtual Visit No    Medication changes reported     No    Fall or balance concerns reported    Yes    Comments He has not fallen, but he is currently walking with a cain due to poor balance and deconditioning.    Tobacco Cessation No Change    Warm-up and Cool-down Performed as group-led instruction    Resistance Training Performed Yes    VAD Patient? No    PAD/SET Patient? No      Pain Assessment   Currently in Pain? No/denies    Pain Score 0-No pain    Multiple Pain Sites No             Capillary Blood Glucose: No results found for this or any previous visit (from the past 24 hour(s)).    Social History   Tobacco Use  Smoking Status Former   Packs/day: 1.00   Years: 25.00   Total pack years: 25.00   Types: Cigarettes   Quit date: 03/27/2010   Years since quitting: 12.4  Smokeless Tobacco Never  Tobacco Comments   Quit x 7 years    Goals Met:  Independence with exercise equipment Exercise tolerated well No report of concerns or symptoms today Strength training completed today  Goals Unmet:  Not Applicable  Comments: Check out 1100.   Dr. Carlyle Dolly is Medical Director for [facility] Cardiac Rehab

## 2022-08-19 DIAGNOSIS — N186 End stage renal disease: Secondary | ICD-10-CM | POA: Diagnosis not present

## 2022-08-19 DIAGNOSIS — D509 Iron deficiency anemia, unspecified: Secondary | ICD-10-CM | POA: Diagnosis not present

## 2022-08-19 DIAGNOSIS — D631 Anemia in chronic kidney disease: Secondary | ICD-10-CM | POA: Diagnosis not present

## 2022-08-19 DIAGNOSIS — N250 Renal osteodystrophy: Secondary | ICD-10-CM | POA: Diagnosis not present

## 2022-08-19 DIAGNOSIS — Z992 Dependence on renal dialysis: Secondary | ICD-10-CM | POA: Diagnosis not present

## 2022-08-20 DIAGNOSIS — Z992 Dependence on renal dialysis: Secondary | ICD-10-CM | POA: Diagnosis not present

## 2022-08-20 DIAGNOSIS — N250 Renal osteodystrophy: Secondary | ICD-10-CM | POA: Diagnosis not present

## 2022-08-20 DIAGNOSIS — D631 Anemia in chronic kidney disease: Secondary | ICD-10-CM | POA: Diagnosis not present

## 2022-08-20 DIAGNOSIS — D509 Iron deficiency anemia, unspecified: Secondary | ICD-10-CM | POA: Diagnosis not present

## 2022-08-20 DIAGNOSIS — N186 End stage renal disease: Secondary | ICD-10-CM | POA: Diagnosis not present

## 2022-08-21 ENCOUNTER — Encounter (HOSPITAL_COMMUNITY)
Admission: RE | Admit: 2022-08-21 | Discharge: 2022-08-21 | Disposition: A | Discharge status: Home / Self Care | Payer: Medicare Other | Source: Ambulatory Visit | Attending: Internal Medicine | Admitting: Internal Medicine

## 2022-08-21 ENCOUNTER — Ambulatory Visit (INDEPENDENT_AMBULATORY_CARE_PROVIDER_SITE_OTHER): Payer: Medicare Other | Admitting: Urology

## 2022-08-21 VITALS — BP 133/77 | HR 69 | Ht 64.0 in | Wt 211.0 lb

## 2022-08-21 DIAGNOSIS — N250 Renal osteodystrophy: Secondary | ICD-10-CM | POA: Diagnosis not present

## 2022-08-21 DIAGNOSIS — Z992 Dependence on renal dialysis: Secondary | ICD-10-CM | POA: Diagnosis not present

## 2022-08-21 DIAGNOSIS — N186 End stage renal disease: Secondary | ICD-10-CM | POA: Diagnosis not present

## 2022-08-21 DIAGNOSIS — Z951 Presence of aortocoronary bypass graft: Secondary | ICD-10-CM

## 2022-08-21 DIAGNOSIS — R3129 Other microscopic hematuria: Secondary | ICD-10-CM

## 2022-08-21 DIAGNOSIS — Z9861 Coronary angioplasty status: Secondary | ICD-10-CM

## 2022-08-21 DIAGNOSIS — D631 Anemia in chronic kidney disease: Secondary | ICD-10-CM | POA: Diagnosis not present

## 2022-08-21 DIAGNOSIS — Z955 Presence of coronary angioplasty implant and graft: Secondary | ICD-10-CM

## 2022-08-21 DIAGNOSIS — I214 Non-ST elevation (NSTEMI) myocardial infarction: Secondary | ICD-10-CM

## 2022-08-21 DIAGNOSIS — I25118 Atherosclerotic heart disease of native coronary artery with other forms of angina pectoris: Secondary | ICD-10-CM | POA: Diagnosis not present

## 2022-08-21 DIAGNOSIS — D509 Iron deficiency anemia, unspecified: Secondary | ICD-10-CM | POA: Diagnosis not present

## 2022-08-21 NOTE — Progress Notes (Signed)
Daily Session Note  Patient Details  Name: [name] [name] MRN: [id number] Date of Birth: [birth year] Referring Provider:   Flowsheet Row CARDIAC REHAB PHASE II ORIENTATION from 08/08/2022 in [place]  Referring Provider Dr. Kipp Brood       Encounter Date: 08/21/2022  Check In:  Session Check In - 08/21/22 1057       Check-In   Supervising physician immediately available to respond to emergencies [facility] MD immediately available    Physician(s) Dr Harl Bowie    Location [facility]    Staff Present Aundra Dubin, RN, Joanette Gula, RN, BSN;Heather Mel Almond, BS, Exercise Physiologist    Virtual Visit No    Medication changes reported     No    Fall or balance concerns reported    Yes    Comments He has not fallen, but he is currently walking with a cain due to poor balance and deconditioning.    Tobacco Cessation No Change    Warm-up and Cool-down Performed as group-led instruction    Resistance Training Performed Yes    VAD Patient? No    PAD/SET Patient? No      Pain Assessment   Currently in Pain? No/denies    Pain Score 0-No pain    Multiple Pain Sites No             Capillary Blood Glucose: No results found for this or any previous visit (from the past 24 hour(s)).    Social History   Tobacco Use  Smoking Status Former   Packs/day: 1.00   Years: 25.00   Total pack years: 25.00   Types: Cigarettes   Quit date: 03/27/2010   Years since quitting: 12.4  Smokeless Tobacco Never  Tobacco Comments   Quit x 7 years    Goals Met:  Independence with exercise equipment Exercise tolerated well No report of concerns or symptoms today Strength training completed today  Goals Unmet:  Not Applicable  Comments: Checkout at 1200.   Dr. Carlyle Dolly is Medical Director for [facility] Cardiac Rehab

## 2022-08-21 NOTE — Progress Notes (Signed)
08/21/2022 2:10 PM   [name] [name] 1951/07/03 [id number]  Referring provider: Carrolyn Meiers, MD [place],  [place] [address]  No chief complaint on file.   HPI:  New patient-  1) dysuria - he noted penile pain in Oct 2023 and this was worse in Nov 2023 when a foley was placed and removed around the time of his CABG. The dysuria has resolved but he wonders what caused it. He is on HD - peritoneal. He voids a small amount each morning. No gross hematuria. AUASS = 6.   2) MH - 11-30 rbc on UA today  - Feb 2024. No further dysuria and no gross hematuria.   3) ED - he underwent removal and replacement of a 3 piece IPP 2019 with Dr. Gloriann Loan.  January 2023 CT of the chest abdomen pelvis was benign.  He had small kidneys.  The reservoir was in the right space of [place]. It cycles normally per pt.   Today, seen for the above.   He was a Administrator.   PMH: Past Medical History:  Diagnosis Date   Anemia    Arthritis    Asthma    Bell palsy    CAD (coronary artery disease)    a. 2014 MV: abnl w/ infap ischemia; b. 03/2013 Cath: aneurysmal bleb in the LAD w/ otw nonobs dzs-->Med Rx.   Chronic back pain    Chronic knee pain    a. 09/2015 s/p R TKA.   Chronic pain    Chronic shoulder pain    Chronic sinusitis    COPD (chronic obstructive pulmonary disease) (HCC)    Diabetes mellitus without complication ([place])    type II    ESRD on peritoneal dialysis ([place])    on peritoneal dialysis, [facility]   Essential hypertension    GERD (gastroesophageal reflux disease)    Gout    Gout    Hepatomegaly    noted on noncontrast CT 2015   History of hiatal hernia    Hyperlipidemia    Lateral meniscus tear    Obesity    Truncal   Obstructive sleep apnea    does not use cpap    On home oxygen therapy    uses 2l when is going somewhere per patient    PUD (peptic ulcer disease)    remote, reports f/u EGD about 8 years ago unremarkable    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 to chemical during 9/11   Sinusitis    Vitamin D deficiency     Surgical History: Past Surgical History:  Procedure Laterality Date   ASAD LT SHOULDER  12/15/2008   left shoulder   AV FISTULA PLACEMENT Left 08/09/2016   Procedure: BRACHIOCEPHALIC ARTERIOVENOUS (AV) FISTULA CREATION LEFT ARM;  Surgeon: [name], MD;  Location: [place];  Service: Vascular;  Laterality: Left;   CAPD INSERTION N/A 10/07/2018   Procedure: LAPAROSCOPIC PERITONEAL CATHETER PLACEMENT;  Surgeon: Mickeal Skinner, MD;  [facility];  Service: General;  Laterality: N/A;   CATARACT EXTRACTION W/PHACO Left 03/28/2016   Procedure: CATARACT EXTRACTION PHACO AND INTRAOCULAR LENS PLACEMENT LEFT EYE;  Surgeon: [name], MD;  Location: [facility];  Service: Ophthalmology;  Laterality: Left;  CDE: 4.77   CATARACT EXTRACTION W/PHACO Right 04/11/2016   Procedure: CATARACT EXTRACTION PHACO AND INTRAOCULAR LENS PLACEMENT RIGHT EYE; CDE:  4.74;  Surgeon: [name], MD;  Location: [facility];  Service: Ophthalmology;  Laterality: Right;  COLONOSCOPY  10/15/2008   Fields: Rectal polyp obliterated, not retrieved, hemorrhoids, single ascending colon diverticulum near the CV. Next colonoscopy April 2020   COLONOSCOPY N/A 12/25/2014   SLF: 1. Colorectal polyps (2) removed 2. Small internal hemorrhoids 3. the left colon is severely redundant. hyperplastic polyps   CORONARY ARTERY BYPASS GRAFT N/A 06/05/2022   Procedure: OFF PUMP CORONARY ARTERY BYPASS GRAFTING (CABG) X 2 BYPASSES USING LEFT INTERNAL MAMMARY ARTERY AND RIGHT LEG GREATER SAPHENOUS VEIN HARVESTED ENDOSCOPICALLY;  Surgeon: Lajuana Matte, MD;  Location: [place];  Service: Open Heart Surgery;  Laterality: N/A;   CORONARY STENT INTERVENTION N/A 07/25/2021   Procedure: CORONARY STENT INTERVENTION;  Surgeon: Sherren Mocha, MD;  Location: [place] [facility];  Service: Cardiovascular;  Laterality: N/A;   CORONARY STENT  INTERVENTION N/A 12/26/2021   Procedure: CORONARY STENT INTERVENTION;  Surgeon: Nelva Bush, MD;  Location: [place] [facility];  Service: Cardiovascular;  Laterality: N/A;   CORONARY STENT INTERVENTION N/A 01/20/2022   Procedure: CORONARY STENT INTERVENTION;  Surgeon: Sherren Mocha, MD;  Location: [place] [facility];  Service: Cardiovascular;  Laterality: N/A;   DOPPLER ECHOCARDIOGRAPHY     ESOPHAGOGASTRODUODENOSCOPY N/A 12/25/2014   SLF: 1. Anemia most likely due to CRI, gastritis, gastric polyps 2. Moderate non-erosive gastriits and mild duodenitis.  3.TWo large gstric polyps removed.    EYE SURGERY  12/22/2010   tear duct probing-[place]   FOREIGN BODY REMOVAL  03/29/2011   Procedure: REMOVAL FOREIGN BODY EXTREMITY;  Surgeon: Arther Abbott, MD;  Location[facility];  Service: Orthopedics;  Laterality: Right;  Removal Foreign Body Right Thumb   IR FLUORO GUIDE CV LINE RIGHT  08/06/2018   IR US GUIDE VASC ACCESS RIGHT  08/06/2018   KNEE ARTHROSCOPY  10/16/2007   left   KNEE ARTHROSCOPY WITH LATERAL MENISECTOMY Right 10/14/2015   Procedure: LEFT KNEE ARTHROSCOPY WITH PARTIAL LATERAL MENISECTOMY;  Surgeon: Carole Civil, MD;  Location[facility];  Service: Orthopedics;  Laterality: Right;   LEFT HEART CATH AND CORONARY ANGIOGRAPHY N/A 07/25/2021   Procedure: LEFT HEART CATH AND CORONARY ANGIOGRAPHY;  Surgeon: Sherren Mocha, MD;  Location: [place] [facility];  Service: Cardiovascular;  Laterality: N/A;   LEFT HEART CATH AND CORONARY ANGIOGRAPHY N/A 12/26/2021   Procedure: LEFT HEART CATH AND CORONARY ANGIOGRAPHY;  Surgeon: Nelva Bush, MD;  Location: [place] [facility];  Service: Cardiovascular;  Laterality: N/A;   LEFT HEART CATH AND CORONARY ANGIOGRAPHY N/A 01/20/2022   Procedure: LEFT HEART CATH AND CORONARY ANGIOGRAPHY;  Surgeon: Sherren Mocha, MD;  Location: [name] [facility];  Service: Cardiovascular;  Laterality: N/A;   LEFT HEART CATHETERIZATION WITH CORONARY ANGIOGRAM N/A  03/28/2013   Procedure: LEFT HEART CATHETERIZATION WITH CORONARY ANGIOGRAM;  Surgeon: Leonie Man, MD;  Location: [facility] C[facility];  Service: Cardiovascular;  Laterality: N/A;   NM MYOVIEW LTD     PENILE PROSTHESIS IMPLANT N/A 08/16/2015   Procedure: PENILE PROTHESIS INFLATABLE, three piece, Excisional biopsy of Penile ulcer, Penile molding;  Surgeon: Carolan Clines, MD;  [facility];  Service: Urology;  Laterality: N/A;   PENILE PROSTHESIS IMPLANT N/A 12/24/2017   Procedure: REMOVAL AND  REPLACEMENT  COLOPLAST PENILE PROSTHESIS;  Surgeon: Lucas Mallow, MD;  [facility];  Service: Urology;  Laterality: N/A;   QUADRICEPS TENDON REPAIR  07/21/2011   Procedure: REPAIR QUADRICEP TENDON;  Surgeon: Arther Abbott, MD;  Location[facility];  Service: Orthopedics;  Laterality: Right;   RIGHT/LEFT HEART CATH AND CORONARY ANGIOGRAPHY  N/A 05/30/2022   Procedure: RIGHT/LEFT HEART CATH AND CORONARY ANGIOGRAPHY;  Surgeon: Jettie Booze, MD;  Location: [place] [facility];  Service: Cardiovascular;  Laterality: N/A;   TEE WITHOUT CARDIOVERSION N/A 06/05/2022   Procedure: TRANSESOPHAGEAL ECHOCARDIOGRAM (TEE);  Surgeon: Lajuana Matte, MD;  Location: [place];  Service: Open Heart Surgery;  Laterality: N/A;   TOENAIL EXCISION     removed [id number]   UMBILICAL HERNIA REPAIR  07/17/2005   [place]    Home Medications:  Allergies as of 08/21/2022       Reactions   Opana [oxymorphone Hcl] Itching   Tramadol Itching        Medication List        Accurate as of August 21, 2022  2:10 PM. If you have any questions, ask your nurse or doctor.          albuterol 108 (90 Base) MCG/ACT inhaler Commonly known as: VENTOLIN HFA Inhale 2 puffs into the lungs every 6 (six) hours as needed for wheezing or shortness of breath.   aspirin EC 81 MG tablet Take 81 mg by mouth daily. Swallow whole.   atorvastatin 80 MG tablet Commonly known as: LIPITOR Take 1 tablet (80 mg total) by  mouth daily.   calcitRIOL 0.25 MCG capsule Commonly known as: ROCALTROL Take 0.25 mcg by mouth daily.   clopidogrel 75 MG tablet Commonly known as: Plavix Take 1 tablet (75 mg total) by mouth daily. Please take first dose at 9 pm on 12/4   Droplet Pen Needles 31G X 5 MM Misc Generic drug: Insulin Pen Needle USE TO INJECT INSULIN DAILY AS DIRECTED   esomeprazole 40 MG capsule Commonly known as: NEXIUM Take 1 capsule (40 mg total) by mouth 2 (two) times daily before a meal.   Lantus SoloStar 100 UNIT/ML Solostar Pen Generic drug: insulin glargine Inject 22 Units into the skin at bedtime.   linagliptin 5 MG Tabs tablet Commonly known as: Tradjenta Take 1 tablet (5 mg total) by mouth daily.   midodrine 5 MG tablet Commonly known as: PROAMATINE Take 3 tablets (15 mg total) by mouth 3 (three) times daily with meals.   OneTouch Ultra test strip Generic drug: glucose blood USE TO CHECK BLOOD SUGAR TWICE DAILY   sevelamer 800 MG tablet Commonly known as: RENAGEL Take 2 tablets (1,600 mg total) by mouth 3 (three) times daily with meals.   Vitamin D (Ergocalciferol) 1.25 MG (50000 UNIT) Caps capsule Commonly known as: DRISDOL Take 50,000 Units by mouth every 7 (seven) days. Monday        Allergies:  Allergies  Allergen Reactions   Opana [Oxymorphone Hcl] Itching   Tramadol Itching    Family History: Family History  Problem Relation Age of Onset   Hypertension Mother        MI   Cancer Mother        breast    Diabetes Mother    Diabetes Father    Hypertension Father    Hypertension Sister    Diabetes Sister    Arthritis Other    Asthma Other    Lung disease Other    Anesthesia problems Neg Hx    Hypotension Neg Hx    Malignant hyperthermia Neg Hx    Pseudochol deficiency Neg Hx    Colon cancer Neg Hx     Social History:  reports that he quit smoking about 12 years ago. His smoking use included cigarettes. He has a 25.00 pack-year smoking history. He  has  never used smokeless tobacco. He reports that he does not currently use alcohol. He reports current drug use. Drug: Marijuana.   Physical Exam: BP 133/77   Pulse 69   Ht '5\' 4"'$  (1.626 m)   Wt 211 lb (95.7 kg)   BMI 36.22 kg/m   Constitutional:  Alert and oriented, No acute distress. HEENT: [place] AT, moist mucus membranes.  Trachea midline, no masses. Cardiovascular: No clubbing, cyanosis, or edema. Respiratory: Normal respiratory effort, no increased work of breathing. GI: Abdomen is soft, nontender, nondistended, no abdominal masses GU: No CVA tenderness Lymph: No cervical or inguinal lymphadenopathy. Skin: No rashes, bruises or suspicious lesions. Neurologic: Grossly intact, no focal deficits, moving all 4 extremities. Psychiatric: Normal mood and affect. GU: Penis circumcised, normal foreskin, glans and meatus normal,  testicles descended bilaterally and palpably normal, bilateral epididymis palpably normal, scrotum normal - IPP in good position. Was slightly inflated and deflated normally.  DRE: Prostate 30 g, smooth without hard area or nodule   Laboratory Data: Lab Results  Component Value Date   WBC 6.6 06/17/2022   HGB 8.3 (L) 06/17/2022   HCT 26.2 (L) 06/17/2022   MCV 99.6 06/17/2022   PLT 215 06/17/2022    Lab Results  Component Value Date   CREATININE 15.16 (H) 06/19/2022    No results found for: "PSA"  No results found for: "TESTOSTERONE"  Lab Results  Component Value Date   HGBA1C 5.9 (H) 05/30/2022    Urinalysis    Component Value Date/Time   COLORURINE YELLOW 01/15/2014 2320   APPEARANCEUR CLEAR 01/15/2014 2320   LABSPEC 1.010 01/15/2014 2320   PHURINE 5.5 01/15/2014 2320   GLUCOSEU NEGATIVE 01/15/2014 2320   HGBUR TRACE (A) 01/15/2014 2320   BILIRUBINUR NEGATIVE 01/15/2014 2320   KETONESUR NEGATIVE 01/15/2014 2320   PROTEINUR 30 (A) 01/15/2014 2320   UROBILINOGEN 0.2 01/15/2014 2320   NITRITE NEGATIVE 01/15/2014 2320   LEUKOCYTESUR NEGATIVE  01/15/2014 2320    Lab Results  Component Value Date   BACTERIA RARE 01/15/2014    Pertinent Imaging:   Results for orders placed during the hospital encounter of 10/06/15  US Renal  Narrative CLINICAL DATA:  Chronic renal disease, stage III. Diabetes and hypertension.  EXAM: RENAL / URINARY TRACT ULTRASOUND COMPLETE  COMPARISON:  None.  FINDINGS: Right Kidney:  Length: 10.8 cm. Increased echogenicity without mass or hydronephrosis.  Left Kidney:  Length: 10.8 cm. Increased echogenicity without mass or hydronephrosis.  Bladder:  Appears normal for degree of bladder distention.  IMPRESSION: Increased echogenicity consistent with chronic renal disease. No hydronephrosis or mass. Normal renal size.   Electronically Signed By: Staci Righter M.D. On: 10/06/2015 09:11  No valid procedures specified. No results found for this or any previous visit.  No results found for this or any previous visit.   Assessment & Plan:    Dysuria - resolved but will eval with MH. Send urine for cx/   MH - check noncon CT and cystoscopy - disc benign and malignant causes of MH   No follow-ups on file.  Festus Aloe, MD  [facility]  [address] [place],  [address] [phone]

## 2022-08-22 DIAGNOSIS — D509 Iron deficiency anemia, unspecified: Secondary | ICD-10-CM | POA: Diagnosis not present

## 2022-08-22 DIAGNOSIS — N250 Renal osteodystrophy: Secondary | ICD-10-CM | POA: Diagnosis not present

## 2022-08-22 DIAGNOSIS — Z992 Dependence on renal dialysis: Secondary | ICD-10-CM | POA: Diagnosis not present

## 2022-08-22 DIAGNOSIS — N186 End stage renal disease: Secondary | ICD-10-CM | POA: Diagnosis not present

## 2022-08-22 DIAGNOSIS — D631 Anemia in chronic kidney disease: Secondary | ICD-10-CM | POA: Diagnosis not present

## 2022-08-22 LAB — MICROSCOPIC EXAMINATION: WBC, UA: >30 /hpf — AB (ref 0–5)

## 2022-08-22 LAB — URINALYSIS, ROUTINE W REFLEX MICROSCOPIC
Bilirubin, UA: NEGATIVE
Glucose, UA: NEGATIVE
Ketones, UA: NEGATIVE
Nitrite, UA: NEGATIVE
Specific Gravity, UA: 1.015 (ref 1.005–1.030)
Urobilinogen, Ur: 0.2 mg/dL (ref 0.2–1.0)
pH, UA: 6 (ref 5.0–7.5)

## 2022-08-23 ENCOUNTER — Encounter (HOSPITAL_COMMUNITY)
Admission: RE | Admit: 2022-08-23 | Discharge: 2022-08-23 | Disposition: A | Discharge status: Home / Self Care | Payer: Medicare Other | Source: Ambulatory Visit | Attending: Internal Medicine | Admitting: Internal Medicine

## 2022-08-23 DIAGNOSIS — D509 Iron deficiency anemia, unspecified: Secondary | ICD-10-CM | POA: Diagnosis not present

## 2022-08-23 DIAGNOSIS — Z9861 Coronary angioplasty status: Secondary | ICD-10-CM | POA: Diagnosis not present

## 2022-08-23 DIAGNOSIS — Z951 Presence of aortocoronary bypass graft: Secondary | ICD-10-CM | POA: Diagnosis not present

## 2022-08-23 DIAGNOSIS — N186 End stage renal disease: Secondary | ICD-10-CM | POA: Diagnosis not present

## 2022-08-23 DIAGNOSIS — I214 Non-ST elevation (NSTEMI) myocardial infarction: Secondary | ICD-10-CM | POA: Diagnosis not present

## 2022-08-23 DIAGNOSIS — Z955 Presence of coronary angioplasty implant and graft: Secondary | ICD-10-CM | POA: Diagnosis not present

## 2022-08-23 DIAGNOSIS — D631 Anemia in chronic kidney disease: Secondary | ICD-10-CM | POA: Diagnosis not present

## 2022-08-23 DIAGNOSIS — Z992 Dependence on renal dialysis: Secondary | ICD-10-CM | POA: Diagnosis not present

## 2022-08-23 DIAGNOSIS — N250 Renal osteodystrophy: Secondary | ICD-10-CM | POA: Diagnosis not present

## 2022-08-23 NOTE — Progress Notes (Signed)
Daily Session Note  Patient Details  Name: [name] [name] MRN: [id number] Date of Birth: [birth year] Referring Provider:   Flowsheet Row CARDIAC REHAB PHASE II ORIENTATION from 08/08/2022 in [place]  Referring Provider Dr. Kipp Brood       Encounter Date: 08/23/2022  Check In:  Session Check In - 08/23/22 1100       Check-In   Supervising physician immediately available to respond to emergencies [facility] MD immediately available    Physician(s) Dr Harl Bowie    Location [facility]    Staff Present Leana Roe, BS, Exercise Physiologist;Gifford Ballon BSN, RN;Debra Wynetta Emery, RN, BSN    Virtual Visit No    Medication changes reported     No    Fall or balance concerns reported    Yes    Comments He has not fallen, but he is currently walking with a cain due to poor balance and deconditioning.    Tobacco Cessation No Change    Warm-up and Cool-down Performed as group-led instruction    Resistance Training Performed Yes    VAD Patient? No    PAD/SET Patient? No      Pain Assessment   Currently in Pain? No/denies    Pain Score 0-No pain    Multiple Pain Sites No             Capillary Blood Glucose: No results found for this or any previous visit (from the past 24 hour(s)).    Social History   Tobacco Use  Smoking Status Former   Packs/day: 1.00   Years: 25.00   Total pack years: 25.00   Types: Cigarettes   Quit date: 03/27/2010   Years since quitting: 12.4  Smokeless Tobacco Never  Tobacco Comments   Quit x 7 years    Goals Met:  Independence with exercise equipment Exercise tolerated well No report of concerns or symptoms today Strength training completed today  Goals Unmet:  Not Applicable  Comments: check out at 12:00   Dr. Carlyle Dolly is Medical Director for [place]

## 2022-08-24 DIAGNOSIS — D631 Anemia in chronic kidney disease: Secondary | ICD-10-CM | POA: Diagnosis not present

## 2022-08-24 DIAGNOSIS — N186 End stage renal disease: Secondary | ICD-10-CM | POA: Diagnosis not present

## 2022-08-24 DIAGNOSIS — Z992 Dependence on renal dialysis: Secondary | ICD-10-CM | POA: Diagnosis not present

## 2022-08-24 DIAGNOSIS — D509 Iron deficiency anemia, unspecified: Secondary | ICD-10-CM | POA: Diagnosis not present

## 2022-08-24 DIAGNOSIS — N250 Renal osteodystrophy: Secondary | ICD-10-CM | POA: Diagnosis not present

## 2022-08-24 LAB — URINE CULTURE

## 2022-08-25 ENCOUNTER — Encounter (HOSPITAL_COMMUNITY)
Admission: RE | Admit: 2022-08-25 | Discharge: 2022-08-25 | Disposition: A | Discharge status: Home / Self Care | Payer: Medicare Other | Source: Ambulatory Visit | Attending: Internal Medicine | Admitting: Internal Medicine

## 2022-08-25 ENCOUNTER — Telehealth: Payer: Self-pay

## 2022-08-25 DIAGNOSIS — D509 Iron deficiency anemia, unspecified: Secondary | ICD-10-CM | POA: Diagnosis not present

## 2022-08-25 DIAGNOSIS — Z951 Presence of aortocoronary bypass graft: Secondary | ICD-10-CM

## 2022-08-25 DIAGNOSIS — Z955 Presence of coronary angioplasty implant and graft: Secondary | ICD-10-CM

## 2022-08-25 DIAGNOSIS — N186 End stage renal disease: Secondary | ICD-10-CM | POA: Diagnosis not present

## 2022-08-25 DIAGNOSIS — D631 Anemia in chronic kidney disease: Secondary | ICD-10-CM | POA: Diagnosis not present

## 2022-08-25 DIAGNOSIS — I214 Non-ST elevation (NSTEMI) myocardial infarction: Secondary | ICD-10-CM

## 2022-08-25 DIAGNOSIS — Z9861 Coronary angioplasty status: Secondary | ICD-10-CM | POA: Diagnosis not present

## 2022-08-25 DIAGNOSIS — N250 Renal osteodystrophy: Secondary | ICD-10-CM | POA: Diagnosis not present

## 2022-08-25 DIAGNOSIS — Z992 Dependence on renal dialysis: Secondary | ICD-10-CM | POA: Diagnosis not present

## 2022-08-25 MED ORDER — CEPHALEXIN 250 MG PO CAPS: 250.0000 mg | ORAL_CAPSULE | Freq: Two times a day (BID) | ORAL | 0 refills | Status: DC

## 2022-08-25 NOTE — Telephone Encounter (Signed)
Patient called with no answer. Detailed message left regarding positive urine culture and abt sent to pharmacy. Requested call back to office Monday morning with any questions or concerns.

## 2022-08-25 NOTE — Telephone Encounter (Signed)
-----   Message from Festus Aloe, MD sent at 08/25/2022 12:23 PM EST ----- He was asymptomatic that day but I will treat with a quick course of abx. He is on dialysis, so please send in cephalexin 250 mg, po BID, #6 (three days total). Thank you.    ----- Message ----- From: Audie Box, CMA Sent: 08/24/2022   5:16 PM EST To: Festus Aloe, MD; [facility], LPN  Please review, no treatment started.

## 2022-08-25 NOTE — Progress Notes (Signed)
Daily Session Note  Patient Details  Name: [name] [name] MRN: [id number] Date of Birth: 1951/02/27 Referring Provider:   Flowsheet Row CARDIAC REHAB PHASE II ORIENTATION from 08/08/2022 in [place]  Referring Provider Dr. Kipp Brood       Encounter Date: 08/25/2022  Check In:  Session Check In - 08/25/22 1056       Check-In   Supervising physician immediately available to respond to emergencies [facility] MD immediately available    Physician(s) Dr Harl Bowie    Location [facility]    Staff Present Leana Roe, BS, Exercise Physiologist;Debra Wynetta Emery, RN, Joanette Gula, RN, BSN    Virtual Visit No    Medication changes reported     No    Fall or balance concerns reported    Yes    Tobacco Cessation No Change    Warm-up and Cool-down Performed as group-led instruction    Resistance Training Performed Yes    VAD Patient? No    PAD/SET Patient? No      Pain Assessment   Currently in Pain? No/denies    Pain Score 0-No pain    Multiple Pain Sites No             Capillary Blood Glucose: No results found for this or any previous visit (from the past 24 hour(s)).    Social History   Tobacco Use  Smoking Status Former   Packs/day: 1.00   Years: 25.00   Total pack years: 25.00   Types: Cigarettes   Quit date: 03/27/2010   Years since quitting: 12.4  Smokeless Tobacco Never  Tobacco Comments   Quit x 7 years    Goals Met:  Independence with exercise equipment Exercise tolerated well No report of concerns or symptoms today Strength training completed today  Goals Unmet:  Not Applicable  Comments: Checkout at 1200.   Dr. Carlyle Dolly is Medical Director for [facility]

## 2022-08-26 DIAGNOSIS — N186 End stage renal disease: Secondary | ICD-10-CM | POA: Diagnosis not present

## 2022-08-26 DIAGNOSIS — N250 Renal osteodystrophy: Secondary | ICD-10-CM | POA: Diagnosis not present

## 2022-08-26 DIAGNOSIS — D509 Iron deficiency anemia, unspecified: Secondary | ICD-10-CM | POA: Diagnosis not present

## 2022-08-26 DIAGNOSIS — D631 Anemia in chronic kidney disease: Secondary | ICD-10-CM | POA: Diagnosis not present

## 2022-08-26 DIAGNOSIS — Z992 Dependence on renal dialysis: Secondary | ICD-10-CM | POA: Diagnosis not present

## 2022-08-27 DIAGNOSIS — Z992 Dependence on renal dialysis: Secondary | ICD-10-CM | POA: Diagnosis not present

## 2022-08-27 DIAGNOSIS — N186 End stage renal disease: Secondary | ICD-10-CM | POA: Diagnosis not present

## 2022-08-27 DIAGNOSIS — D509 Iron deficiency anemia, unspecified: Secondary | ICD-10-CM | POA: Diagnosis not present

## 2022-08-27 DIAGNOSIS — N250 Renal osteodystrophy: Secondary | ICD-10-CM | POA: Diagnosis not present

## 2022-08-27 DIAGNOSIS — D631 Anemia in chronic kidney disease: Secondary | ICD-10-CM | POA: Diagnosis not present

## 2022-08-28 ENCOUNTER — Encounter (HOSPITAL_COMMUNITY)
Admission: RE | Admit: 2022-08-28 | Discharge: 2022-08-28 | Disposition: A | Discharge status: Home / Self Care | Payer: Medicare Other | Source: Ambulatory Visit | Attending: Internal Medicine | Admitting: Internal Medicine

## 2022-08-28 DIAGNOSIS — I214 Non-ST elevation (NSTEMI) myocardial infarction: Secondary | ICD-10-CM

## 2022-08-28 DIAGNOSIS — D509 Iron deficiency anemia, unspecified: Secondary | ICD-10-CM | POA: Diagnosis not present

## 2022-08-28 DIAGNOSIS — Z9861 Coronary angioplasty status: Secondary | ICD-10-CM | POA: Diagnosis not present

## 2022-08-28 DIAGNOSIS — N250 Renal osteodystrophy: Secondary | ICD-10-CM | POA: Diagnosis not present

## 2022-08-28 DIAGNOSIS — N186 End stage renal disease: Secondary | ICD-10-CM | POA: Diagnosis not present

## 2022-08-28 DIAGNOSIS — Z951 Presence of aortocoronary bypass graft: Secondary | ICD-10-CM | POA: Diagnosis not present

## 2022-08-28 DIAGNOSIS — D631 Anemia in chronic kidney disease: Secondary | ICD-10-CM | POA: Diagnosis not present

## 2022-08-28 DIAGNOSIS — Z955 Presence of coronary angioplasty implant and graft: Secondary | ICD-10-CM | POA: Diagnosis not present

## 2022-08-28 DIAGNOSIS — Z992 Dependence on renal dialysis: Secondary | ICD-10-CM | POA: Diagnosis not present

## 2022-08-28 NOTE — Progress Notes (Signed)
Daily Session Note  Patient Details  Name: [name] [name] MRN: [id number] Date of Birth: [birth year] Referring Provider:   Flowsheet Row CARDIAC REHAB PHASE II ORIENTATION from 08/08/2022 in [place]  Referring Provider Dr. Kipp Brood       Encounter Date: 08/28/2022  Check In:  Session Check In - 08/28/22 1100       Check-In   Supervising physician immediately available to respond to emergencies [facility] MD immediately available    Physician(s) Dr. Domenic Polite    Location [facility]    Staff Present Leana Roe, BS, Exercise Physiologist;Debra Wynetta Emery, RN, BSN;Dalton Sherrie George, MS, ACSM-CEP    Virtual Visit No    Medication changes reported     No    Fall or balance concerns reported    Yes    Comments He has not fallen, but he is currently walking with a cain due to poor balance and deconditioning.    Tobacco Cessation No Change    Warm-up and Cool-down Performed as group-led instruction    Resistance Training Performed Yes    VAD Patient? No    PAD/SET Patient? No      Pain Assessment   Currently in Pain? No/denies    Pain Score 0-No pain    Multiple Pain Sites No             Capillary Blood Glucose: No results found for this or any previous visit (from the past 24 hour(s)).    Social History   Tobacco Use  Smoking Status Former   Packs/day: 1.00   Years: 25.00   Total pack years: 25.00   Types: Cigarettes   Quit date: 03/27/2010   Years since quitting: 12.4  Smokeless Tobacco Never  Tobacco Comments   Quit x 7 years    Goals Met:  Independence with exercise equipment Exercise tolerated well No report of concerns or symptoms today Strength training completed today  Goals Unmet:  Not Applicable  Comments: check out 1200    Dr. Carlyle Dolly is Medical Director for [facility]

## 2022-08-29 DIAGNOSIS — D631 Anemia in chronic kidney disease: Secondary | ICD-10-CM | POA: Diagnosis not present

## 2022-08-29 DIAGNOSIS — D509 Iron deficiency anemia, unspecified: Secondary | ICD-10-CM | POA: Diagnosis not present

## 2022-08-29 DIAGNOSIS — N250 Renal osteodystrophy: Secondary | ICD-10-CM | POA: Diagnosis not present

## 2022-08-29 DIAGNOSIS — Z992 Dependence on renal dialysis: Secondary | ICD-10-CM | POA: Diagnosis not present

## 2022-08-29 DIAGNOSIS — N186 End stage renal disease: Secondary | ICD-10-CM | POA: Diagnosis not present

## 2022-08-30 ENCOUNTER — Encounter (HOSPITAL_COMMUNITY)
Admission: RE | Admit: 2022-08-30 | Discharge: 2022-08-30 | Disposition: A | Discharge status: Home / Self Care | Payer: Medicare Other | Source: Ambulatory Visit | Attending: Internal Medicine | Admitting: Internal Medicine

## 2022-08-30 DIAGNOSIS — D631 Anemia in chronic kidney disease: Secondary | ICD-10-CM | POA: Diagnosis not present

## 2022-08-30 DIAGNOSIS — D509 Iron deficiency anemia, unspecified: Secondary | ICD-10-CM | POA: Diagnosis not present

## 2022-08-30 DIAGNOSIS — N250 Renal osteodystrophy: Secondary | ICD-10-CM | POA: Diagnosis not present

## 2022-08-30 DIAGNOSIS — Z992 Dependence on renal dialysis: Secondary | ICD-10-CM | POA: Diagnosis not present

## 2022-08-30 DIAGNOSIS — N186 End stage renal disease: Secondary | ICD-10-CM | POA: Diagnosis not present

## 2022-08-30 LAB — GLUCOSE, CAPILLARY: Glucose-Capillary: 92 mg/dL (ref 70–99)

## 2022-08-30 NOTE — Progress Notes (Signed)
Incomplete Session Note  Patient Details  Name: [name] [name] MRN: [id number] Date of Birth: 1951/02/01 Referring Provider:   Flowsheet Row CARDIAC REHAB PHASE II ORIENTATION from 08/08/2022 in [place]  Referring Provider Dr. Lacinda Axon did not complete his rehab session.  Patient's blood sugar was 92 which is below our exercise parameters for IDDM. Encouraged patient to eat something before he come to [facility]. He verbalized understanding. He was sympathetic.

## 2022-08-31 DIAGNOSIS — D509 Iron deficiency anemia, unspecified: Secondary | ICD-10-CM | POA: Diagnosis not present

## 2022-08-31 DIAGNOSIS — D631 Anemia in chronic kidney disease: Secondary | ICD-10-CM | POA: Diagnosis not present

## 2022-08-31 DIAGNOSIS — N186 End stage renal disease: Secondary | ICD-10-CM | POA: Diagnosis not present

## 2022-08-31 DIAGNOSIS — Z992 Dependence on renal dialysis: Secondary | ICD-10-CM | POA: Diagnosis not present

## 2022-08-31 DIAGNOSIS — N250 Renal osteodystrophy: Secondary | ICD-10-CM | POA: Diagnosis not present

## 2022-09-01 ENCOUNTER — Encounter (HOSPITAL_COMMUNITY): Payer: Medicare Other

## 2022-09-01 DIAGNOSIS — N250 Renal osteodystrophy: Secondary | ICD-10-CM | POA: Diagnosis not present

## 2022-09-01 DIAGNOSIS — Z992 Dependence on renal dialysis: Secondary | ICD-10-CM | POA: Diagnosis not present

## 2022-09-01 DIAGNOSIS — D631 Anemia in chronic kidney disease: Secondary | ICD-10-CM | POA: Diagnosis not present

## 2022-09-01 DIAGNOSIS — N186 End stage renal disease: Secondary | ICD-10-CM | POA: Diagnosis not present

## 2022-09-01 DIAGNOSIS — D509 Iron deficiency anemia, unspecified: Secondary | ICD-10-CM | POA: Diagnosis not present

## 2022-09-02 DIAGNOSIS — Z992 Dependence on renal dialysis: Secondary | ICD-10-CM | POA: Diagnosis not present

## 2022-09-02 DIAGNOSIS — D509 Iron deficiency anemia, unspecified: Secondary | ICD-10-CM | POA: Diagnosis not present

## 2022-09-02 DIAGNOSIS — N186 End stage renal disease: Secondary | ICD-10-CM | POA: Diagnosis not present

## 2022-09-02 DIAGNOSIS — D631 Anemia in chronic kidney disease: Secondary | ICD-10-CM | POA: Diagnosis not present

## 2022-09-02 DIAGNOSIS — N250 Renal osteodystrophy: Secondary | ICD-10-CM | POA: Diagnosis not present

## 2022-09-03 DIAGNOSIS — N186 End stage renal disease: Secondary | ICD-10-CM | POA: Diagnosis not present

## 2022-09-03 DIAGNOSIS — D631 Anemia in chronic kidney disease: Secondary | ICD-10-CM | POA: Diagnosis not present

## 2022-09-03 DIAGNOSIS — D509 Iron deficiency anemia, unspecified: Secondary | ICD-10-CM | POA: Diagnosis not present

## 2022-09-03 DIAGNOSIS — Z992 Dependence on renal dialysis: Secondary | ICD-10-CM | POA: Diagnosis not present

## 2022-09-03 DIAGNOSIS — N250 Renal osteodystrophy: Secondary | ICD-10-CM | POA: Diagnosis not present

## 2022-09-04 ENCOUNTER — Encounter (HOSPITAL_COMMUNITY)
Admission: RE | Admit: 2022-09-04 | Discharge: 2022-09-04 | Disposition: A | Discharge status: Home / Self Care | Payer: Medicare Other | Source: Ambulatory Visit | Attending: Internal Medicine | Admitting: Internal Medicine

## 2022-09-04 DIAGNOSIS — I214 Non-ST elevation (NSTEMI) myocardial infarction: Secondary | ICD-10-CM

## 2022-09-04 DIAGNOSIS — N186 End stage renal disease: Secondary | ICD-10-CM | POA: Diagnosis not present

## 2022-09-04 DIAGNOSIS — D509 Iron deficiency anemia, unspecified: Secondary | ICD-10-CM | POA: Diagnosis not present

## 2022-09-04 DIAGNOSIS — N250 Renal osteodystrophy: Secondary | ICD-10-CM | POA: Diagnosis not present

## 2022-09-04 DIAGNOSIS — Z955 Presence of coronary angioplasty implant and graft: Secondary | ICD-10-CM | POA: Diagnosis not present

## 2022-09-04 DIAGNOSIS — D631 Anemia in chronic kidney disease: Secondary | ICD-10-CM | POA: Diagnosis not present

## 2022-09-04 DIAGNOSIS — Z992 Dependence on renal dialysis: Secondary | ICD-10-CM | POA: Diagnosis not present

## 2022-09-04 DIAGNOSIS — Z9861 Coronary angioplasty status: Secondary | ICD-10-CM | POA: Diagnosis not present

## 2022-09-04 DIAGNOSIS — Z951 Presence of aortocoronary bypass graft: Secondary | ICD-10-CM | POA: Diagnosis not present

## 2022-09-04 NOTE — Progress Notes (Signed)
Daily Session Note  Patient Details  Name: [name] [name] MRN: [id number] Date of Birth: 11-29-1950 Referring Provider:   Flowsheet Row CARDIAC REHAB PHASE II ORIENTATION from 08/08/2022 in [place]  Referring Provider Dr. Kipp Brood       Encounter Date: 09/04/2022  Check In:  Session Check In - 09/04/22 1100       Check-In   Supervising physician immediately available to respond to emergencies [facility] MD immediately available    Physician(s) Dr. Dellia Cloud    Location [facility]    Staff Present Leana Roe, BS, Exercise Physiologist;Daphyne Hassell Done, RN, BSN    Virtual Visit No    Medication changes reported     No    Fall or balance concerns reported    Yes    Comments He has not fallen, but he is currently walking with a cain due to poor balance and deconditioning.    Tobacco Cessation No Change    Warm-up and Cool-down Performed as group-led instruction    Resistance Training Performed Yes    VAD Patient? No    PAD/SET Patient? No      Pain Assessment   Currently in Pain? No/denies    Pain Score 0-No pain    Multiple Pain Sites No             Capillary Blood Glucose: No results found for this or any previous visit (from the past 24 hour(s)).    Social History   Tobacco Use  Smoking Status Former   Packs/day: 1.00   Years: 25.00   Total pack years: 25.00   Types: Cigarettes   Quit date: 03/27/2010   Years since quitting: 12.4  Smokeless Tobacco Never  Tobacco Comments   Quit x 7 years    Goals Met:  Independence with exercise equipment Exercise tolerated well No report of concerns or symptoms today Strength training completed today  Goals Unmet:  Not Applicable  Comments: check out 1200   Dr. Carlyle Dolly is Medical Director for [facility]

## 2022-09-05 DIAGNOSIS — N186 End stage renal disease: Secondary | ICD-10-CM | POA: Diagnosis not present

## 2022-09-05 DIAGNOSIS — D631 Anemia in chronic kidney disease: Secondary | ICD-10-CM | POA: Diagnosis not present

## 2022-09-05 DIAGNOSIS — D509 Iron deficiency anemia, unspecified: Secondary | ICD-10-CM | POA: Diagnosis not present

## 2022-09-05 DIAGNOSIS — Z992 Dependence on renal dialysis: Secondary | ICD-10-CM | POA: Diagnosis not present

## 2022-09-05 DIAGNOSIS — N250 Renal osteodystrophy: Secondary | ICD-10-CM | POA: Diagnosis not present

## 2022-09-06 ENCOUNTER — Encounter (HOSPITAL_COMMUNITY)
Admission: RE | Admit: 2022-09-06 | Discharge: 2022-09-06 | Disposition: A | Discharge status: Home / Self Care | Payer: Medicare Other | Source: Ambulatory Visit | Attending: Internal Medicine | Admitting: Internal Medicine

## 2022-09-06 DIAGNOSIS — N186 End stage renal disease: Secondary | ICD-10-CM | POA: Diagnosis not present

## 2022-09-06 DIAGNOSIS — Z955 Presence of coronary angioplasty implant and graft: Secondary | ICD-10-CM | POA: Diagnosis not present

## 2022-09-06 DIAGNOSIS — I214 Non-ST elevation (NSTEMI) myocardial infarction: Secondary | ICD-10-CM

## 2022-09-06 DIAGNOSIS — D631 Anemia in chronic kidney disease: Secondary | ICD-10-CM | POA: Diagnosis not present

## 2022-09-06 DIAGNOSIS — Z951 Presence of aortocoronary bypass graft: Secondary | ICD-10-CM | POA: Diagnosis not present

## 2022-09-06 DIAGNOSIS — Z992 Dependence on renal dialysis: Secondary | ICD-10-CM | POA: Diagnosis not present

## 2022-09-06 DIAGNOSIS — Z9861 Coronary angioplasty status: Secondary | ICD-10-CM | POA: Diagnosis not present

## 2022-09-06 DIAGNOSIS — D509 Iron deficiency anemia, unspecified: Secondary | ICD-10-CM | POA: Diagnosis not present

## 2022-09-06 DIAGNOSIS — N250 Renal osteodystrophy: Secondary | ICD-10-CM | POA: Diagnosis not present

## 2022-09-06 NOTE — Progress Notes (Signed)
Daily Session Note  Patient Details  Name: ADONYS KADEN MRN: [id number] Date of Birth: Jun 21, 1951 Referring Provider:   Flowsheet Row CARDIAC REHAB PHASE II ORIENTATION from 08/08/2022 in [place]  Referring Provider Dr. Kipp Brood       Encounter Date: 09/06/2022  Check In:  Session Check In - 09/06/22 1100       Check-In   Supervising physician immediately available to respond to emergencies [facility] immediately available    Physician(s) Dr. Dellia Cloud    Location [facility]    Staff Present Leana Roe, BS, Exercise Physiologist;Dalton Sherrie George, MS, ACSM-CEP    Virtual Visit No    Medication changes reported     No    Fall or balance concerns reported    Yes    Comments He has not fallen, but he is currently walking with a cain due to poor balance and deconditioning.    Tobacco Cessation No Change    Warm-up and Cool-down Performed as group-led instruction    Resistance Training Performed Yes    VAD Patient? No    PAD/SET Patient? No      Pain Assessment   Currently in Pain? No/denies    Pain Score 0-No pain    Multiple Pain Sites No             Capillary Blood Glucose: No results found for this or any previous visit (from the past 24 hour(s)).    Social History   Tobacco Use  Smoking Status Former   Packs/day: 1.00   Years: 25.00   Total pack years: 25.00   Types: Cigarettes   Quit date: 03/27/2010   Years since quitting: 12.4  Smokeless Tobacco Never  Tobacco Comments   Quit x 7 years    Goals Met:  Independence with exercise equipment Exercise tolerated well No report of concerns or symptoms today Strength training completed today  Goals Unmet:  Not Applicable  Comments: check out 1200   Dr. Carlyle Dolly is Medical Director for [facility]

## 2022-09-07 DIAGNOSIS — Z992 Dependence on renal dialysis: Secondary | ICD-10-CM | POA: Diagnosis not present

## 2022-09-07 DIAGNOSIS — N250 Renal osteodystrophy: Secondary | ICD-10-CM | POA: Diagnosis not present

## 2022-09-07 DIAGNOSIS — D631 Anemia in chronic kidney disease: Secondary | ICD-10-CM | POA: Diagnosis not present

## 2022-09-07 DIAGNOSIS — D509 Iron deficiency anemia, unspecified: Secondary | ICD-10-CM | POA: Diagnosis not present

## 2022-09-07 DIAGNOSIS — N186 End stage renal disease: Secondary | ICD-10-CM | POA: Diagnosis not present

## 2022-09-08 ENCOUNTER — Encounter (HOSPITAL_COMMUNITY)
Admission: RE | Admit: 2022-09-08 | Discharge: 2022-09-08 | Disposition: A | Discharge status: Home / Self Care | Payer: Medicare Other | Source: Ambulatory Visit | Attending: Internal Medicine | Admitting: Internal Medicine

## 2022-09-08 DIAGNOSIS — I214 Non-ST elevation (NSTEMI) myocardial infarction: Secondary | ICD-10-CM | POA: Diagnosis not present

## 2022-09-08 DIAGNOSIS — Z955 Presence of coronary angioplasty implant and graft: Secondary | ICD-10-CM | POA: Diagnosis not present

## 2022-09-08 DIAGNOSIS — Z951 Presence of aortocoronary bypass graft: Secondary | ICD-10-CM | POA: Diagnosis not present

## 2022-09-08 DIAGNOSIS — N250 Renal osteodystrophy: Secondary | ICD-10-CM | POA: Diagnosis not present

## 2022-09-08 DIAGNOSIS — D631 Anemia in chronic kidney disease: Secondary | ICD-10-CM | POA: Diagnosis not present

## 2022-09-08 DIAGNOSIS — Z9861 Coronary angioplasty status: Secondary | ICD-10-CM | POA: Diagnosis not present

## 2022-09-08 DIAGNOSIS — D509 Iron deficiency anemia, unspecified: Secondary | ICD-10-CM | POA: Diagnosis not present

## 2022-09-08 DIAGNOSIS — Z992 Dependence on renal dialysis: Secondary | ICD-10-CM | POA: Diagnosis not present

## 2022-09-08 DIAGNOSIS — N186 End stage renal disease: Secondary | ICD-10-CM | POA: Diagnosis not present

## 2022-09-08 NOTE — Progress Notes (Signed)
Daily Session Note  Patient Details  Name: [name] [name] MRN: [id number] Date of Birth: Aug 31, 1950 Referring Provider:   Flowsheet Row CARDIAC REHAB PHASE II ORIENTATION from 08/08/2022 in [place]  Referring Provider Dr. Kipp Brood       Encounter Date: 09/08/2022  Check In:  Session Check In - 09/08/22 1100       Check-In   Supervising physician immediately available to respond to emergencies [facility] immediately available    Physician(s) Dr. Dellia Cloud    Location [facility]    Staff Present Hoy Register MHA, MS, ACSM-CEP;Leana Roe, BS, Exercise Physiologist;Debra Wynetta Emery, RN, BSN    Virtual Visit No    Medication changes reported     No    Fall or balance concerns reported    Yes    Comments He has not fallen, but he is currently walking with a cain due to poor balance and deconditioning.    Tobacco Cessation No Change    Warm-up and Cool-down Performed as group-led instruction    Resistance Training Performed Yes    VAD Patient? No    PAD/SET Patient? No      Pain Assessment   Currently in Pain? No/denies    Pain Score 0-No pain    Multiple Pain Sites No             Capillary Blood Glucose: No results found for this or any previous visit (from the past 24 hour(s)).    Social History   Tobacco Use  Smoking Status Former   Packs/day: 1.00   Years: 25.00   Total pack years: 25.00   Types: Cigarettes   Quit date: 03/27/2010   Years since quitting: 12.4  Smokeless Tobacco Never  Tobacco Comments   Quit x 7 years    Goals Met:  Independence with exercise equipment Exercise tolerated well No report of concerns or symptoms today Strength training completed today  Goals Unmet:  Not Applicable  Comments: checkout time is 1200   Dr. Carlyle Dolly is Medical Director for [facility]

## 2022-09-09 DIAGNOSIS — N186 End stage renal disease: Secondary | ICD-10-CM | POA: Diagnosis not present

## 2022-09-09 DIAGNOSIS — D509 Iron deficiency anemia, unspecified: Secondary | ICD-10-CM | POA: Diagnosis not present

## 2022-09-09 DIAGNOSIS — Z992 Dependence on renal dialysis: Secondary | ICD-10-CM | POA: Diagnosis not present

## 2022-09-09 DIAGNOSIS — D631 Anemia in chronic kidney disease: Secondary | ICD-10-CM | POA: Diagnosis not present

## 2022-09-09 DIAGNOSIS — N250 Renal osteodystrophy: Secondary | ICD-10-CM | POA: Diagnosis not present

## 2022-09-10 DIAGNOSIS — Z992 Dependence on renal dialysis: Secondary | ICD-10-CM | POA: Diagnosis not present

## 2022-09-10 DIAGNOSIS — D509 Iron deficiency anemia, unspecified: Secondary | ICD-10-CM | POA: Diagnosis not present

## 2022-09-10 DIAGNOSIS — D631 Anemia in chronic kidney disease: Secondary | ICD-10-CM | POA: Diagnosis not present

## 2022-09-10 DIAGNOSIS — N250 Renal osteodystrophy: Secondary | ICD-10-CM | POA: Diagnosis not present

## 2022-09-10 DIAGNOSIS — N186 End stage renal disease: Secondary | ICD-10-CM | POA: Diagnosis not present

## 2022-09-11 ENCOUNTER — Encounter: Payer: Self-pay | Admitting: Neurology

## 2022-09-11 ENCOUNTER — Encounter (HOSPITAL_COMMUNITY): Payer: Medicare Other

## 2022-09-11 ENCOUNTER — Ambulatory Visit (INDEPENDENT_AMBULATORY_CARE_PROVIDER_SITE_OTHER): Payer: Medicare Other | Admitting: Neurology

## 2022-09-11 VITALS — BP 147/74 | HR 41 | Ht 64.0 in | Wt 220.0 lb

## 2022-09-11 DIAGNOSIS — N250 Renal osteodystrophy: Secondary | ICD-10-CM | POA: Diagnosis not present

## 2022-09-11 DIAGNOSIS — D509 Iron deficiency anemia, unspecified: Secondary | ICD-10-CM | POA: Diagnosis not present

## 2022-09-11 DIAGNOSIS — R251 Tremor, unspecified: Secondary | ICD-10-CM | POA: Diagnosis not present

## 2022-09-11 DIAGNOSIS — N186 End stage renal disease: Secondary | ICD-10-CM | POA: Diagnosis not present

## 2022-09-11 DIAGNOSIS — R413 Other amnesia: Secondary | ICD-10-CM | POA: Diagnosis not present

## 2022-09-11 DIAGNOSIS — R269 Unspecified abnormalities of gait and mobility: Secondary | ICD-10-CM

## 2022-09-11 DIAGNOSIS — D631 Anemia in chronic kidney disease: Secondary | ICD-10-CM | POA: Diagnosis not present

## 2022-09-11 DIAGNOSIS — I25118 Atherosclerotic heart disease of native coronary artery with other forms of angina pectoris: Secondary | ICD-10-CM

## 2022-09-11 DIAGNOSIS — Z992 Dependence on renal dialysis: Secondary | ICD-10-CM | POA: Diagnosis not present

## 2022-09-11 MED ORDER — LORAZEPAM 1 MG PO TABS: ORAL_TABLET | ORAL | 0 refills | Status: DC

## 2022-09-11 NOTE — Progress Notes (Addendum)
Chief Complaint  Patient presents with   New Patient (Initial Visit)    Rm 17. Accompanied by wife, Stanton Kidney. NX [facility] (proc '17)/Paper referral for Persistent tremor / [name] MD. Patient has mild unintentional tremor noted to bilateral upper extremities.      ASSESSMENT AND PLAN  [name] [name] is a 72 y.o. male  Complains of worsening gait abnormality, memory loss since his open heart surgery in November 2023,  MoCA examination 21/30  MRI of the brain to rule out cerebrovascular event,  Laboratory evaluation to rule out treatable etiology for memory complaints  Encouraging moderate exercise  Return To Clinic With NP In 4 Months    DIAGNOSTIC DATA (LABS, IMAGING, TESTING) - I reviewed patient records, labs, notes, testing and imaging myself where available. Addendum MRI of the brain from [facility] September 27, 2022, no acute abnormality, mild to moderate paranasal sinus mucosal thickening, left greater than right,    MEDICAL HISTORY:  [name] [name] is a 72 year old male, seen in request by his primary care physician Dr. Legrand Rams, [name], for evaluation of worsening gait abnormality, memory loss, left hand tremor since open heart surgery, he is accompanied by his wife Stanton Kidney at today's clinical visit on September 11 2022  I reviewed and summarized the referring note. PMHX. CABG in Nov 2023 CAD in Jan 2023, s/p stent HLD Chronic low back pain DM HTN Left shoulder rotator cuff Left AV fistula placment,  ESRD-peritoneal dialysis since 2019. Penile prosthesis implant  He has end-stage renal disease, receiving peritoneal dialysis since 2019, has a left arm AV fistula, also had a history of left shoulder rotator cuff surgery in the past, around 2023, he noticed bilateral hands tremor, most noticeable when holding a cup, using utensils, writing, he denies family history of tremor  He also has a long history of diabetic peripheral neuropathy, numbness tingling, cold  sensation at bilateral toes, fingers, using gabapentin which seems to help him  He underwent open heart surgery for coronary artery disease in November 2023, since then he was noted to have worsening bilateral hands tremor, wife also concerned about his worsening gait abnormality, he can still ambulate without assistant, he denies significant low back, neck pain, denies bowel bladder incontinence    PHYSICAL EXAM:   Vitals:   09/11/22 1051  BP: (!) 147/74  Pulse: (!) 41  SpO2: 95%  Weight: 220 lb (99.8 kg)  Height: 5\' 4"  (1.626 m)   Not recorded     Body mass index is 37.76 kg/m.  PHYSICAL EXAMNIATION:  Gen: NAD, conversant, well nourised, well groomed                     Cardiovascular: Regular rate rhythm, no peripheral edema, warm, nontender. Eyes: Conjunctivae clear without exudates or hemorrhage Neck: Supple, no carotid bruits. Pulmonary: Clear to auscultation bilaterally   NEUROLOGICAL EXAM:  MENTAL STATUS: Speech/cognition: Awake, alert, oriented to history taking and casual conversation    09/11/2022   10:58 AM  Montreal Cognitive Assessment   Visuospatial/ Executive (0/5) 2  Naming (0/3) 3  Attention: Read list of digits (0/2) 1  Attention: Read list of letters (0/1) 1  Attention: Serial 7 subtraction starting at 100 (0/3) 3  Language: Repeat phrase (0/2) 1  Language : Fluency (0/1) 0  Abstraction (0/2) 2  Delayed Recall (0/5) 3  Orientation (0/6) 5  Total 21  Adjusted Score (based on education) 21    CRANIAL NERVES: CN II: Visual  fields are full to confrontation. Pupils are round equal and briskly reactive to light. CN III, IV, VI: extraocular movement are normal. No ptosis. CN V: Facial sensation is intact to light touch CN VII: Mild left upper and lower face weakness, synergistic movement CN VIII: Hearing is normal to causal conversation. CN IX, X: Phonation is normal. CN XI: Head turning and shoulder shrug are intact  MOTOR:  Muscle strength  is normal.  No rigidity, no bradykinesia, mild action tremor drawing spiral circle  REFLEXES: Reflexes are 1 and symmetric at the biceps, triceps, knees, and ankles. Plantar responses are flexor.  SENSORY: Intact to light touch, pinprick and vibratory sensation are intact in fingers and toes.  COORDINATION: There is no trunk or limb dysmetria noted.  GAIT/STANCE: Need push-up to get up from seated position, limited by his big body habitus, mildly unsteady  REVIEW OF SYSTEMS:  Full 14 system review of systems performed and notable only for as above All other review of systems were negative.   ALLERGIES: Allergies  Allergen Reactions   Opana [Oxymorphone Hcl] Itching   Tramadol Itching    HOME MEDICATIONS: Current Outpatient Medications  Medication Sig Dispense Refill   albuterol (VENTOLIN HFA) 108 (90 Base) MCG/ACT inhaler Inhale 2 puffs into the lungs every 6 (six) hours as needed for wheezing or shortness of breath. 18 g 11   aspirin EC 81 MG tablet Take 81 mg by mouth daily. Swallow whole.     atorvastatin (LIPITOR) 80 MG tablet Take 1 tablet (80 mg total) by mouth daily. 30 tablet 1   clopidogrel (PLAVIX) 75 MG tablet Take 1 tablet (75 mg total) by mouth daily. Please take first dose at 9 pm on 12/4 30 tablet 1   DROPLET PEN NEEDLES 31G X 5 MM MISC USE TO INJECT INSULIN DAILY AS DIRECTED 100 each 1   esomeprazole (NEXIUM) 40 MG capsule Take 1 capsule (40 mg total) by mouth 2 (two) times daily before a meal. 180 capsule 1   insulin glargine (LANTUS SOLOSTAR) 100 UNIT/ML Solostar Pen Inject 22 Units into the skin at bedtime. 18 mL 3   linagliptin (TRADJENTA) 5 MG TABS tablet Take 1 tablet (5 mg total) by mouth daily. 90 tablet 3   ONETOUCH ULTRA test strip USE TO CHECK BLOOD SUGAR TWICE DAILY 200 strip 2   sevelamer (RENAGEL) 800 MG tablet Take 2 tablets (1,600 mg total) by mouth 3 (three) times daily with meals. 180 tablet 1   Vitamin D, Ergocalciferol, (DRISDOL) 1.25 MG  (50000 UNIT) CAPS capsule Take 50,000 Units by mouth every 7 (seven) days. Monday     No current facility-administered medications for this visit.    PAST MEDICAL HISTORY: Past Medical History:  Diagnosis Date   Anemia    Arthritis    Asthma    Bell palsy    CAD (coronary artery disease)    a. 2014 MV: abnl w/ infap ischemia; b. 03/2013 Cath: aneurysmal bleb in the LAD w/ otw nonobs dzs-->Med Rx.   Chronic back pain    Chronic knee pain    a. 09/2015 s/p R TKA.   Chronic pain    Chronic shoulder pain    Chronic sinusitis    COPD (chronic obstructive pulmonary disease) ([place])    Diabetes mellitus without complication ([place])    type II    ESRD on peritoneal dialysis ([place])    on peritoneal dialysis, [facility]   Essential hypertension    GERD (gastroesophageal reflux disease)  Gout    Gout    Hepatomegaly    noted on noncontrast CT 2015   History of hiatal hernia    Hyperlipidemia    Lateral meniscus tear    Obesity    Truncal   Obstructive sleep apnea    does not use cpap    On home oxygen therapy    uses 2l when is going somewhere per patient    PUD (peptic ulcer disease)    remote, reports f/u EGD about 8 years ago unremarkable    Reactive airway disease    related to exposure to chemical during 9/11   Sinusitis    Vitamin D deficiency     PAST SURGICAL HISTORY: Past Surgical History:  Procedure Laterality Date   ASAD LT SHOULDER  12/15/2008   left shoulder   AV FISTULA PLACEMENT Left 08/09/2016   Procedure: BRACHIOCEPHALIC ARTERIOVENOUS (AV) FISTULA CREATION LEFT ARM;  Surgeon: [name], MD;  [facility];  Service: Vascular;  Laterality: Left;   CAPD INSERTION N/A 10/07/2018   Procedure: LAPAROSCOPIC PERITONEAL CATHETER PLACEMENT;  Surgeon: Mickeal Skinner, MD;  Location: [facility];  Service: General;  Laterality: N/A;   CATARACT EXTRACTION W/PHACO Left 03/28/2016   Procedure: CATARACT EXTRACTION PHACO AND INTRAOCULAR LENS PLACEMENT LEFT  EYE;  Surgeon: [name], MD;  Location: [facility];  Service: Ophthalmology;  Laterality: Left;  CDE: 4.77   CATARACT EXTRACTION W/PHACO Right 04/11/2016   Procedure: CATARACT EXTRACTION PHACO AND INTRAOCULAR LENS PLACEMENT RIGHT EYE; CDE:  4.74;  Surgeon: [name], MD;  Location: [facility];  Service: Ophthalmology;  Laterality: Right;   COLONOSCOPY  10/15/2008   Fields: Rectal polyp obliterated, not retrieved, hemorrhoids, single ascending colon diverticulum near the CV. Next colonoscopy April 2020   COLONOSCOPY N/A 12/25/2014   SLF: 1. Colorectal polyps (2) removed 2. Small internal hemorrhoids 3. the left colon is severely redundant. hyperplastic polyps   CORONARY ARTERY BYPASS GRAFT N/A 06/05/2022   Procedure: OFF PUMP CORONARY ARTERY BYPASS GRAFTING (CABG) X 2 BYPASSES USING LEFT INTERNAL MAMMARY ARTERY AND RIGHT LEG GREATER SAPHENOUS VEIN HARVESTED ENDOSCOPICALLY;  Surgeon: Lajuana Matte, MD;  Location: [place];  Service: Open Heart Surgery;  Laterality: N/A;   CORONARY STENT INTERVENTION N/A 07/25/2021   Procedure: CORONARY STENT INTERVENTION;  Surgeon: Sherren Mocha, MD;  Location: [place] CV LAB;  Service: Cardiovascular;  Laterality: N/A;   CORONARY STENT INTERVENTION N/A 12/26/2021   Procedure: CORONARY STENT INTERVENTION;  Surgeon: Nelva Bush, MD;  Location: [place] CV LAB;  Service: Cardiovascular;  Laterality: N/A;   CORONARY STENT INTERVENTION N/A 01/20/2022   Procedure: CORONARY STENT INTERVENTION;  Surgeon: Sherren Mocha, MD;  Location: [place] CV LAB;  Service: Cardiovascular;  Laterality: N/A;   DOPPLER ECHOCARDIOGRAPHY     ESOPHAGOGASTRODUODENOSCOPY N/A 12/25/2014   SLF: 1. Anemia most likely due to CRI, gastritis, gastric polyps 2. Moderate non-erosive gastriits and mild duodenitis.  3.TWo large gstric polyps removed.    EYE SURGERY  12/22/2010   tear duct probing-[place]   FOREIGN BODY REMOVAL  03/29/2011   Procedure: REMOVAL FOREIGN BODY EXTREMITY;   Surgeon: Arther Abbott, MD;  Location: [facility];  Service: Orthopedics;  Laterality: Right;  Removal Foreign Body Right Thumb   IR FLUORO GUIDE CV LINE RIGHT  08/06/2018   IR US GUIDE VASC ACCESS RIGHT  08/06/2018   KNEE ARTHROSCOPY  10/16/2007   left   KNEE ARTHROSCOPY WITH LATERAL MENISECTOMY Right 10/14/2015   Procedure: LEFT KNEE ARTHROSCOPY WITH  PARTIAL LATERAL MENISECTOMY;  Surgeon: Carole Civil, MD;  [facility];  Service: Orthopedics;  Laterality: Right;   LEFT HEART CATH AND CORONARY ANGIOGRAPHY N/A 07/25/2021   Procedure: LEFT HEART CATH AND CORONARY ANGIOGRAPHY;  Surgeon: Sherren Mocha, MD;  Location: [place] [facility];  Service: Cardiovascular;  Laterality: N/A;   LEFT HEART CATH AND CORONARY ANGIOGRAPHY N/A 12/26/2021   Procedure: LEFT HEART CATH AND CORONARY ANGIOGRAPHY;  Surgeon: Nelva Bush, MD;  Location: [place] CV LAB;  Service: Cardiovascular;  Laterality: N/A;   LEFT HEART CATH AND CORONARY ANGIOGRAPHY N/A 01/20/2022   Procedure: LEFT HEART CATH AND CORONARY ANGIOGRAPHY;  Surgeon: Sherren Mocha, MD;  Location: [place] [facility] LAB;  Service: Cardiovascular;  Laterality: N/A;   LEFT HEART CATHETERIZATION WITH CORONARY ANGIOGRAM N/A 03/28/2013   Procedure: LEFT HEART CATHETERIZATION WITH CORONARY ANGIOGRAM;  Surgeon: Leonie Man, MD;  Location: [place] CATH LAB;  Service: Cardiovascular;  Laterality: N/A;   NM MYOVIEW LTD     PENILE PROSTHESIS IMPLANT N/A 08/16/2015   Procedure: PENILE PROTHESIS INFLATABLE, three piece, Excisional biopsy of Penile ulcer, Penile molding;  Surgeon: Carolan Clines, MD;  [facility];  Service: Urology;  Laterality: N/A;   PENILE PROSTHESIS IMPLANT N/A 12/24/2017   Procedure: REMOVAL AND  REPLACEMENT  COLOPLAST PENILE PROSTHESIS;  Surgeon: Lucas Mallow, MD;  [facility];  Service: Urology;  Laterality: N/A;   QUADRICEPS TENDON REPAIR  07/21/2011   Procedure: REPAIR QUADRICEP TENDON;  Surgeon: Arther Abbott,  MD;  Location: [facility];  Service: Orthopedics;  Laterality: Right;   RIGHT/LEFT HEART CATH AND CORONARY ANGIOGRAPHY N/A 05/30/2022   Procedure: RIGHT/LEFT HEART CATH AND CORONARY ANGIOGRAPHY;  Surgeon: Jettie Booze, MD;  Location: [place] CV LAB;  Service: Cardiovascular;  Laterality: N/A;   TEE WITHOUT CARDIOVERSION N/A 06/05/2022   Procedure: TRANSESOPHAGEAL ECHOCARDIOGRAM (TEE);  Surgeon: Lajuana Matte, MD;  Location: [place];  Service: Open Heart Surgery;  Laterality: N/A;   TOENAIL EXCISION     removed [id number]   UMBILICAL HERNIA REPAIR  07/17/2005   [place]    FAMILY HISTORY: Family History  Problem Relation Age of Onset   Hypertension Mother        MI   Cancer Mother        breast    Diabetes Mother    Diabetes Father    Hypertension Father    Hypertension Sister    Diabetes Sister    Arthritis Other    Asthma Other    Lung disease Other    Anesthesia problems Neg Hx    Hypotension Neg Hx    Malignant hyperthermia Neg Hx    Pseudochol deficiency Neg Hx    Colon cancer Neg Hx     SOCIAL HISTORY: Social History   Socioeconomic History   Marital status: Married    Spouse name: Not on file   Number of children: 2   Years of education: 12th grade   Highest education level: Not on file  Occupational History   Occupation: disabled   Occupation:      Fish farm manager: UNEMPLOYED  Tobacco Use   Smoking status: Former    Packs/day: 1.00    Years: 25.00    Total pack years: 25.00    Types: Cigarettes    Quit date: 03/27/2010    Years since quitting: 12.4   Smokeless tobacco: Never   Tobacco comments:    Quit x 7 years  Vaping Use   Vaping Use:  Never used  Substance and Sexual Activity   Alcohol use: Not Currently    Comment: occasionally   Drug use: Yes    Types: Marijuana    Comment: cocaine- last time used- 11/24/2017 , marijuana-    Sexual activity: Yes  Other Topics Concern   Not on file  Social History Narrative   He quit smoking in  2010. 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 He developed pulmonary problems, became disabled because of lower airway disease in 2009.       WATCHES BASKETBALL. HIS TEAM IS [place].   Social Determinants of Health   Financial Resource Strain: Low Risk  (12/29/2021)   Overall Financial Resource Strain (CARDIA)    Difficulty of Paying Living Expenses: Not hard at all  Food Insecurity: No Food Insecurity (06/07/2022)   Hunger Vital Sign    Worried About Running Out of Food in the Last Year: Never true    Ran Out of Food in the Last Year: Never true  Transportation Needs: No Transportation Needs (06/07/2022)   PRAPARE - Hydrologist (Medical): No    Lack of Transportation (Non-Medical): No  Physical Activity: Sufficiently Active (12/29/2021)   Exercise Vital Sign    Days of Exercise per Week: 3 days    Minutes of Exercise per Session: 60 min  Stress: No Stress Concern Present (12/29/2021)   [place]    Feeling of Stress : Not at all  Social Connections: [place] (12/29/2021)   Social Connection and Isolation Panel [NHANES]    Frequency of Communication with Friends and Family: More than three times a week    Frequency of Social Gatherings with Friends and Family: More than three times a week    Attends Religious Services: More than 4 times per year    Active Member of [employer] or Organizations: Yes    Attends Archivist Meetings: More than 4 times per year    Marital Status: Married  Human resources officer Violence: Not At Risk (06/07/2022)   Humiliation, Afraid, Rape, and Kick questionnaire    Fear of Current or Ex-Partner: No    Emotionally Abused: No    Physically Abused: No    Sexually Abused: No      Marcial Pacas, M.D. Ph.D.  [facility] [address], [place], [place] [address] Ph: [phone] Fax:  [phone]  CC:  Carrolyn Meiers, MD [place],  [place] [address]  Carrolyn Meiers, MD  +----------------------------++-----+-++----+++----+---++--------------+-

## 2022-09-12 ENCOUNTER — Ambulatory Visit: Payer: Medicare Other | Admitting: Gastroenterology

## 2022-09-12 ENCOUNTER — Encounter: Payer: Self-pay | Admitting: Gastroenterology

## 2022-09-12 DIAGNOSIS — D509 Iron deficiency anemia, unspecified: Secondary | ICD-10-CM | POA: Diagnosis not present

## 2022-09-12 DIAGNOSIS — Z992 Dependence on renal dialysis: Secondary | ICD-10-CM | POA: Diagnosis not present

## 2022-09-12 DIAGNOSIS — D631 Anemia in chronic kidney disease: Secondary | ICD-10-CM | POA: Diagnosis not present

## 2022-09-12 DIAGNOSIS — N250 Renal osteodystrophy: Secondary | ICD-10-CM | POA: Diagnosis not present

## 2022-09-12 DIAGNOSIS — N186 End stage renal disease: Secondary | ICD-10-CM | POA: Diagnosis not present

## 2022-09-13 ENCOUNTER — Encounter (HOSPITAL_COMMUNITY)
Admission: RE | Admit: 2022-09-13 | Discharge: 2022-09-13 | Disposition: A | Discharge status: Home / Self Care | Payer: Medicare Other | Source: Ambulatory Visit | Attending: Internal Medicine | Admitting: Internal Medicine

## 2022-09-13 ENCOUNTER — Telehealth: Payer: Self-pay

## 2022-09-13 DIAGNOSIS — Z951 Presence of aortocoronary bypass graft: Secondary | ICD-10-CM

## 2022-09-13 DIAGNOSIS — Z9861 Coronary angioplasty status: Secondary | ICD-10-CM | POA: Diagnosis not present

## 2022-09-13 DIAGNOSIS — I214 Non-ST elevation (NSTEMI) myocardial infarction: Secondary | ICD-10-CM

## 2022-09-13 DIAGNOSIS — N250 Renal osteodystrophy: Secondary | ICD-10-CM | POA: Diagnosis not present

## 2022-09-13 DIAGNOSIS — Z955 Presence of coronary angioplasty implant and graft: Secondary | ICD-10-CM | POA: Diagnosis not present

## 2022-09-13 DIAGNOSIS — N186 End stage renal disease: Secondary | ICD-10-CM | POA: Diagnosis not present

## 2022-09-13 DIAGNOSIS — D509 Iron deficiency anemia, unspecified: Secondary | ICD-10-CM | POA: Diagnosis not present

## 2022-09-13 DIAGNOSIS — Z992 Dependence on renal dialysis: Secondary | ICD-10-CM | POA: Diagnosis not present

## 2022-09-13 DIAGNOSIS — D631 Anemia in chronic kidney disease: Secondary | ICD-10-CM | POA: Diagnosis not present

## 2022-09-13 LAB — RPR, QUANT+TP ABS (REFLEX)
Rapid Plasma Reagin, Quant: 1:1 {titer} — ABNORMAL HIGH
T Pallidum Abs: NONREACTIVE

## 2022-09-13 LAB — TSH: TSH: 1.86 u[IU]/mL (ref 0.450–4.500)

## 2022-09-13 LAB — VITAMIN B12: Vitamin B-12: 688 pg/mL (ref 232–1245)

## 2022-09-13 LAB — RPR: RPR Ser Ql: REACTIVE — AB

## 2022-09-13 NOTE — Progress Notes (Signed)
Daily Session Note  Patient Details  Name: [name] [name] MRN: [id number] Date of Birth: 1950/12/28 Referring Provider:   Flowsheet Row CARDIAC REHAB PHASE II ORIENTATION from 08/08/2022 in [place]  Referring Provider Dr. Kipp Brood       Encounter Date: 09/13/2022  Check In:  Session Check In - 09/13/22 1100       Check-In   Supervising physician immediately available to respond to emergencies [facility] MD immediately available    Physician(s) Dr. Harl Bowie    Location [facility]    Staff Present Leana Roe, BS, Exercise Physiologist;Debra Wynetta Emery, RN, BSN    Virtual Visit No    Medication changes reported     No    Fall or balance concerns reported    Yes    Comments He has not fallen, but he is currently walking with a cain due to poor balance and deconditioning.    Tobacco Cessation No Change    Warm-up and Cool-down Performed as group-led instruction    Resistance Training Performed Yes    VAD Patient? No    PAD/SET Patient? No      Pain Assessment   Currently in Pain? No/denies    Pain Score 0-No pain    Multiple Pain Sites No             Capillary Blood Glucose: No results found for this or any previous visit (from the past 24 hour(s)).    Social History   Tobacco Use  Smoking Status Former   Packs/day: 1.00   Years: 25.00   Total pack years: 25.00   Types: Cigarettes   Quit date: 03/27/2010   Years since quitting: 12.4  Smokeless Tobacco Never  Tobacco Comments   Quit x 7 years    Goals Met:  Independence with exercise equipment Exercise tolerated well No report of concerns or symptoms today Strength training completed today  Goals Unmet:  Not Applicable  Comments: check out 1200    Dr. Carlyle Dolly is Medical Director for [place]

## 2022-09-13 NOTE — Progress Notes (Signed)
Cardiac Individual Treatment Plan  Patient Details  Name: [name] [name] MRN: [id number] Date of Birth: 07-31-1950 Referring Provider:   Flowsheet Row CARDIAC REHAB PHASE II ORIENTATION from 08/08/2022 in [facility]  Referring Provider Dr. Kipp Brood       Initial Encounter Date:  Flowsheet Row CARDIAC REHAB PHASE II ORIENTATION from 08/08/2022 in [facility]  Date 08/08/22       Visit Diagnosis: S/P CABG x 2  NSTEMI (non-ST elevated myocardial infarction) ([place])  Patient's Home Medications on Admission:  Current Outpatient Medications:    albuterol (VENTOLIN HFA) 108 (90 Base) MCG/ACT inhaler, Inhale 2 puffs into the lungs every 6 (six) hours as needed for wheezing or shortness of breath., Disp: 18 g, Rfl: 11   aspirin EC 81 MG tablet, Take 81 mg by mouth daily. Swallow whole., Disp: , Rfl:    atorvastatin (LIPITOR) 80 MG tablet, Take 1 tablet (80 mg total) by mouth daily., Disp: 30 tablet, Rfl: 1   clopidogrel (PLAVIX) 75 MG tablet, Take 1 tablet (75 mg total) by mouth daily. Please take first dose at 9 pm on 12/4, Disp: 30 tablet, Rfl: 1   DROPLET PEN NEEDLES 31G X 5 MM MISC, USE TO INJECT INSULIN DAILY AS DIRECTED, Disp: 100 each, Rfl: 1   esomeprazole (NEXIUM) 40 MG capsule, Take 1 capsule (40 mg total) by mouth 2 (two) times daily before a meal., Disp: 180 capsule, Rfl: 1   insulin glargine (LANTUS SOLOSTAR) 100 UNIT/ML Solostar Pen, Inject 22 Units into the skin at bedtime., Disp: 18 mL, Rfl: 3   linagliptin (TRADJENTA) 5 MG TABS tablet, Take 1 tablet (5 mg total) by mouth daily., Disp: 90 tablet, Rfl: 3   LORazepam (ATIVAN) 1 MG tablet, Take 1-2 tablets 30 minutes prior to MRI, may repeat once as needed. Must have driver., Disp: 3 tablet, Rfl: 0   ONETOUCH ULTRA test strip, USE TO CHECK BLOOD SUGAR TWICE DAILY, Disp: 200 strip, Rfl: 2   sevelamer (RENAGEL) 800 MG tablet, Take 2 tablets (1,600 mg total) by mouth 3 (three) times daily  with meals., Disp: 180 tablet, Rfl: 1   Vitamin D, Ergocalciferol, (DRISDOL) 1.25 MG (50000 UNIT) CAPS capsule, Take 50,000 Units by mouth every 7 (seven) days. Monday, Disp: , Rfl:   Past Medical History: Past Medical History:  Diagnosis Date   Anemia    Arthritis    Asthma    Bell palsy    CAD (coronary artery disease)    a. 2014 MV: abnl w/ infap ischemia; b. 03/2013 Cath: aneurysmal bleb in the LAD w/ otw nonobs dzs-->Med Rx.   Chronic back pain    Chronic knee pain    a. 09/2015 s/p R TKA.   Chronic pain    Chronic shoulder pain    Chronic sinusitis    COPD (chronic obstructive pulmonary disease) ([place])    Diabetes mellitus without complication ([place])    type II    ESRD on peritoneal dialysis ([place])    on peritoneal dialysis, [facility]   Essential hypertension    GERD (gastroesophageal reflux disease)    Gout    Gout    Hepatomegaly    noted on noncontrast CT 2015   History of hiatal hernia    Hyperlipidemia    Lateral meniscus tear    Obesity    Truncal   Obstructive sleep apnea    does not use cpap    On home oxygen therapy    uses  2l when is going somewhere per patient    PUD (peptic ulcer disease)    remote, reports f/u EGD about 8 years ago unremarkable    Reactive airway disease    related to exposure to chemical during 9/11   Sinusitis    Vitamin D deficiency     Tobacco Use: Social History   Tobacco Use  Smoking Status Former   Packs/day: 1.00   Years: 25.00   Total pack years: 25.00   Types: Cigarettes   Quit date: 03/27/2010   Years since quitting: 12.4  Smokeless Tobacco Never  Tobacco Comments   Quit x 7 years    Labs: Review Flowsheet  More data exists      Latest Ref Rng & Units 05/30/2022 06/03/2022 06/05/2022 06/06/2022 06/07/2022  Labs for ITP Cardiac and Pulmonary Rehab  Cholestrol 0 - 200 mg/dL - 88  - - -  LDL (calc) 0 - 99 mg/dL - 36  - - -  HDL-C >40 mg/dL - 32  - - -  Trlycerides <150 mg/dL - 98  - - -  Hemoglobin  A1c 4.8 - 5.6 % 5.9  - - - -  PH, Arterial 7.35 - 7.45 - - 7.323  7.387  7.374  7.345  7.287  7.353  7.335  7.372  7.373  7.336   PCO2 arterial 32 - 48 mmHg - - 46.0  41.0  45.2  29.5  33.7  29.9  40.1  45.6  39.3  25.2   Bicarbonate 20.0 - 28.0 mmol/L 28.3  28.4  26.7  - 24.4  24.7  26.3  16.1  16.0  16.5  21.4  26.4  23.0  13.5   TCO2 22 - 32 mmol/L '30  30  28  '$ - '26  22  26  26  28  28  17  17  17  23  28  24  14   '$ Acid-base deficit 0.0 - 2.0 mmol/L - - 2.0  9.0  10.0  8.0  4.0  2.0  11.0   O2 Saturation % 65  63  97  - 94  99  100  98  91  99  90  99  98  98     Capillary Blood Glucose: Lab Results  Component Value Date   GLUCAP 92 08/30/2022   GLUCAP 118 (H) 08/08/2022   GLUCAP 124 (H) 06/19/2022   GLUCAP 134 (H) 06/19/2022   GLUCAP 158 (H) 06/18/2022    POCT Glucose     Row Name 08/08/22 0835             POCT Blood Glucose   Pre-Exercise 118 mg/dL                Exercise Target Goals: Exercise Program Goal: Individual exercise prescription set using results from initial 6 min walk test and THRR while considering  patient's activity barriers and safety.   Exercise Prescription Goal: Starting with aerobic activity 30 plus minutes a day, 3 days per week for initial exercise prescription. Provide home exercise prescription and guidelines that participant acknowledges understanding prior to discharge.  Activity Barriers & Risk Stratification:  Activity Barriers & Cardiac Risk Stratification - 08/08/22 0844       Activity Barriers & Cardiac Risk Stratification   Activity Barriers Arthritis;Back Problems;Neck/Spine Problems;Joint Problems;Deconditioning;Muscular Weakness;Shortness of Breath;Incisional Pain;Balance Concerns;Assistive Device    Cardiac Risk Stratification High             6 Minute Walk:  6 Minute Walk     Row Name 08/08/22 1000         6 Minute Walk   Phase Initial     Distance 1000 feet     Walk Time 6 minutes     # of Rest Breaks 0      MPH 1.89     METS 1.8     RPE 13     VO2 Peak 6.31     Symptoms No     Resting HR 55 bpm     Resting BP 138/80     Resting Oxygen Saturation  95 %     Exercise Oxygen Saturation  during 6 min walk 96 %     Max Ex. HR 90 bpm     Max Ex. BP 150/80     2 Minute Post BP 130/80              Oxygen Initial Assessment:   Oxygen Re-Evaluation:   Oxygen Discharge (Final Oxygen Re-Evaluation):   Initial Exercise Prescription:  Initial Exercise Prescription - 08/08/22 1000       Date of Initial Exercise RX and Referring Provider   Date 08/08/22    Referring Provider Dr. Kipp Brood    Expected Discharge Date 11/03/22      Treadmill   MPH 1    Grade 0    Minutes 17      NuStep   Level 1    SPM 80    Minutes 22      Prescription Details   Frequency (times per week) 3    Duration Progress to 30 minutes of continuous aerobic without signs/symptoms of physical distress      Intensity   THRR 40-80% of Max Heartrate 59-118    Ratings of Perceived Exertion 11-13      Resistance Training   Training Prescription Yes    Weight 4    Reps 10-15             Perform Capillary Blood Glucose checks as needed.  Exercise Prescription Changes:   Exercise Prescription Changes     Row Name 08/14/22 1600 08/28/22 1200 09/08/22 1424         Response to Exercise   Blood Pressure (Admit) 128/80 138/76 132/68     Blood Pressure (Exercise) 152/68 150/82 138/68     Blood Pressure (Exit) 130/80 132/70 120/70     Heart Rate (Admit) 49 bpm 79 bpm 62 bpm     Heart Rate (Exercise) 77 bpm 91 bpm 97 bpm     Heart Rate (Exit) 58 bpm 84 bpm 66 bpm     Rating of Perceived Exertion (Exercise) '11 12 12     '$ Duration Continue with 30 min of aerobic exercise without signs/symptoms of physical distress. Continue with 30 min of aerobic exercise without signs/symptoms of physical distress. Continue with 30 min of aerobic exercise without signs/symptoms of physical distress.     Intensity  THRR unchanged THRR unchanged THRR unchanged       Progression   Progression Continue to progress workloads to maintain intensity without signs/symptoms of physical distress. Continue to progress workloads to maintain intensity without signs/symptoms of physical distress. Continue to progress workloads to maintain intensity without signs/symptoms of physical distress.       Resistance Training   Training Prescription Yes Yes Yes     Weight '3 4 4     '$ Reps 10-15 10-15 10-15     Time 10 Minutes 10  Minutes 10 Minutes       Treadmill   MPH 1.1 2 1.7     Grade 0 0 0     Minutes '17 17 17     '$ METs 1.84 2.53 2.3       NuStep   Level '1 2 4     '$ SPM 82 93 80     Minutes '22 22 22     '$ METs 2.08 2.26 2.34              Exercise Comments:   Exercise Goals and Review:   Exercise Goals     Row Name 08/08/22 1002 08/14/22 1601 09/11/22 1424         Exercise Goals   Increase Physical Activity Yes Yes Yes     Intervention Provide advice, education, support and counseling about physical activity/exercise needs.;Develop an individualized exercise prescription for aerobic and resistive training based on initial evaluation findings, risk stratification, comorbidities and participant's personal goals. Provide advice, education, support and counseling about physical activity/exercise needs.;Develop an individualized exercise prescription for aerobic and resistive training based on initial evaluation findings, risk stratification, comorbidities and participant's personal goals. Provide advice, education, support and counseling about physical activity/exercise needs.;Develop an individualized exercise prescription for aerobic and resistive training based on initial evaluation findings, risk stratification, comorbidities and participant's personal goals.     Expected Outcomes Short Term: Attend rehab on a regular basis to increase amount of physical activity.;Long Term: Add in home exercise to make  exercise part of routine and to increase amount of physical activity.;Long Term: Exercising regularly at least 3-5 days a week. Short Term: Attend rehab on a regular basis to increase amount of physical activity.;Long Term: Add in home exercise to make exercise part of routine and to increase amount of physical activity.;Long Term: Exercising regularly at least 3-5 days a week. Short Term: Attend rehab on a regular basis to increase amount of physical activity.;Long Term: Add in home exercise to make exercise part of routine and to increase amount of physical activity.;Long Term: Exercising regularly at least 3-5 days a week.     Increase Strength and Stamina Yes Yes Yes     Intervention Provide advice, education, support and counseling about physical activity/exercise needs.;Develop an individualized exercise prescription for aerobic and resistive training based on initial evaluation findings, risk stratification, comorbidities and participant's personal goals. Provide advice, education, support and counseling about physical activity/exercise needs.;Develop an individualized exercise prescription for aerobic and resistive training based on initial evaluation findings, risk stratification, comorbidities and participant's personal goals. Provide advice, education, support and counseling about physical activity/exercise needs.;Develop an individualized exercise prescription for aerobic and resistive training based on initial evaluation findings, risk stratification, comorbidities and participant's personal goals.     Expected Outcomes Short Term: Increase workloads from initial exercise prescription for resistance, speed, and METs.;Short Term: Perform resistance training exercises routinely during rehab and add in resistance training at home;Long Term: Improve cardiorespiratory fitness, muscular endurance and strength as measured by increased METs and functional capacity (6MWT) Short Term: Increase workloads from  initial exercise prescription for resistance, speed, and METs.;Short Term: Perform resistance training exercises routinely during rehab and add in resistance training at home;Long Term: Improve cardiorespiratory fitness, muscular endurance and strength as measured by increased METs and functional capacity (6MWT) Short Term: Increase workloads from initial exercise prescription for resistance, speed, and METs.;Short Term: Perform resistance training exercises routinely during rehab and add in resistance training at home;Long Term: Improve cardiorespiratory fitness, muscular endurance  and strength as measured by increased METs and functional capacity (6MWT)     Able to understand and use rate of perceived exertion (RPE) scale Yes Yes Yes     Intervention Provide education and explanation on how to use RPE scale Provide education and explanation on how to use RPE scale Provide education and explanation on how to use RPE scale     Expected Outcomes Short Term: Able to use RPE daily in rehab to express subjective intensity level;Long Term:  Able to use RPE to guide intensity level when exercising independently Short Term: Able to use RPE daily in rehab to express subjective intensity level;Long Term:  Able to use RPE to guide intensity level when exercising independently Short Term: Able to use RPE daily in rehab to express subjective intensity level;Long Term:  Able to use RPE to guide intensity level when exercising independently     Knowledge and understanding of Target Heart Rate Range (THRR) Yes Yes Yes     Intervention Provide education and explanation of THRR including how the numbers were predicted and where they are located for reference Provide education and explanation of THRR including how the numbers were predicted and where they are located for reference Provide education and explanation of THRR including how the numbers were predicted and where they are located for reference     Expected Outcomes  Short Term: Able to state/look up THRR;Long Term: Able to use THRR to govern intensity when exercising independently;Short Term: Able to use daily as guideline for intensity in rehab Short Term: Able to state/look up THRR;Long Term: Able to use THRR to govern intensity when exercising independently;Short Term: Able to use daily as guideline for intensity in rehab Short Term: Able to state/look up THRR;Long Term: Able to use THRR to govern intensity when exercising independently;Short Term: Able to use daily as guideline for intensity in rehab     Able to check pulse independently Yes -- Yes     Intervention Provide education and demonstration on how to check pulse in carotid and radial arteries.;Review the importance of being able to check your own pulse for safety during independent exercise Provide education and demonstration on how to check pulse in carotid and radial arteries.;Review the importance of being able to check your own pulse for safety during independent exercise Provide education and demonstration on how to check pulse in carotid and radial arteries.;Review the importance of being able to check your own pulse for safety during independent exercise     Expected Outcomes Short Term: Able to explain why pulse checking is important during independent exercise;Long Term: Able to check pulse independently and accurately Short Term: Able to explain why pulse checking is important during independent exercise;Long Term: Able to check pulse independently and accurately Short Term: Able to explain why pulse checking is important during independent exercise;Long Term: Able to check pulse independently and accurately     Understanding of Exercise Prescription Yes Yes Yes     Intervention Provide education, explanation, and written materials on patient's individual exercise prescription Provide education, explanation, and written materials on patient's individual exercise prescription Provide education,  explanation, and written materials on patient's individual exercise prescription     Expected Outcomes Short Term: Able to explain program exercise prescription;Long Term: Able to explain home exercise prescription to exercise independently Short Term: Able to explain program exercise prescription;Long Term: Able to explain home exercise prescription to exercise independently Short Term: Able to explain program exercise prescription;Long Term: Able to explain  home exercise prescription to exercise independently              Exercise Goals Re-Evaluation :  Exercise Goals Re-Evaluation     Row Name 08/14/22 1602 09/11/22 1425           Exercise Goal Re-Evaluation   Exercise Goals Review Increase Strength and Stamina;Increase Physical Activity;Able to understand and use rate of perceived exertion (RPE) scale;Knowledge and understanding of Target Heart Rate Range (THRR);Able to check pulse independently;Understanding of Exercise Prescription Increase Physical Activity;Increase Strength and Stamina;Able to understand and use rate of perceived exertion (RPE) scale;Knowledge and understanding of Target Heart Rate Range (THRR);Able to check pulse independently;Understanding of Exercise Prescription      Comments Pt just completed his first session of CR. He has been through the program before and is egar to get started to improve his strength. He is currently exercising at 2.08 METs on the stepper. Will continue to monitor and progress as able. Pt has completed 11 sessions of cardiac rehab. He is egar to be in the class and is ready to improve his strength. He is currently exercising at 2.34 METs on the stepper. Will continue to monitor and progress as able.      Expected Outcomes Through exercise at home and at rehab, the patient will meet their stated goals. Through exercise at home and at rehab, the patient will meet their stated goals.                Discharge Exercise Prescription (Final  Exercise Prescription Changes):  Exercise Prescription Changes - 09/08/22 1424       Response to Exercise   Blood Pressure (Admit) 132/68    Blood Pressure (Exercise) 138/68    Blood Pressure (Exit) 120/70    Heart Rate (Admit) 62 bpm    Heart Rate (Exercise) 97 bpm    Heart Rate (Exit) 66 bpm    Rating of Perceived Exertion (Exercise) 12    Duration Continue with 30 min of aerobic exercise without signs/symptoms of physical distress.    Intensity THRR unchanged      Progression   Progression Continue to progress workloads to maintain intensity without signs/symptoms of physical distress.      Resistance Training   Training Prescription Yes    Weight 4    Reps 10-15    Time 10 Minutes      Treadmill   MPH 1.7    Grade 0    Minutes 17    METs 2.3      NuStep   Level 4    SPM 80    Minutes 22    METs 2.34             Nutrition:  Target Goals: Understanding of nutrition guidelines, daily intake of sodium '1500mg'$ , cholesterol '200mg'$ , calories 30% from fat and 7% or less from saturated fats, daily to have 5 or more servings of fruits and vegetables.  Biometrics:  Pre Biometrics - 08/08/22 1003       Pre Biometrics   Height '5\' 4"'$  (1.626 m)    Weight 94.6 kg    Waist Circumference 46 inches    Hip Circumference 45 inches    Waist to Hip Ratio 1.02 %    BMI (Calculated) 35.78    Triceps Skinfold 25 mm    % Body Fat 36.3 %    Grip Strength 31.5 kg    Flexibility 0 in    Single Leg Stand 0 seconds  Nutrition Therapy Plan and Nutrition Goals:  Nutrition Therapy & Goals - 09/06/22 1252       Personal Nutrition Goals   Comments We provide educational sessions on heart healthy nutrition with handouts and assistance with RD referral if patient is interested.      Intervention Plan   Intervention Nutrition handout(s) given to patient.    Expected Outcomes Short Term Goal: Understand basic principles of dietary content, such as calories, fat,  sodium, cholesterol and nutrients.             Nutrition Assessments:  Nutrition Assessments - 08/08/22 0827       MEDFICTS Scores   Pre Score 19            MEDIFICTS Score Key: ?70 Need to make dietary changes  40-70 Heart Healthy Diet ? 40 Therapeutic Level Cholesterol Diet   Picture Your Plate Scores: [id number] Unhealthy dietary pattern with much room for improvement. 41-50 Dietary pattern unlikely to meet recommendations for good health and room for improvement. 51-60 More healthful dietary pattern, with some room for improvement.  >60 Healthy dietary pattern, although there may be some specific behaviors that could be improved.    Nutrition Goals Re-Evaluation:   Nutrition Goals Discharge (Final Nutrition Goals Re-Evaluation):   Psychosocial: Target Goals: Acknowledge presence or absence of significant depression and/or stress, maximize coping skills, provide positive support system. Participant is able to verbalize types and ability to use techniques and skills needed for reducing stress and depression.  Initial Review & Psychosocial Screening:  Initial Psych Review & Screening - 08/08/22 0845       Initial Review   Current issues with None Identified      Family Dynamics   Good Support System? Yes    Comments His wife is his support system.      Barriers   Psychosocial barriers to participate in program There are no identifiable barriers or psychosocial needs.      Screening Interventions   Interventions Encouraged to exercise;Provide feedback about the scores to participant    Expected Outcomes Long Term goal: The participant improves quality of Life and PHQ9 Scores as seen by post scores and/or verbalization of changes;Short Term goal: Identification and review with participant of any Quality of Life or Depression concerns found by scoring the questionnaire.             Quality of Life Scores:  Quality of Life - 08/08/22 1003       Quality of  Life   Select Quality of Life      Quality of Life Scores   Health/Function Pre 14.8 %    Socioeconomic Pre 23.07 %    Psych/Spiritual Pre 26.57 %    Family Pre 30 %    GLOBAL Pre 21.78 %            Scores of 19 and below usually indicate a poorer quality of life in these areas.  A difference of  2-3 points is a clinically meaningful difference.  A difference of 2-3 points in the total score of the Quality of Life Index has been associated with significant improvement in overall quality of life, self-image, physical symptoms, and general health in studies assessing change in quality of life.  PHQ-9: Review Flowsheet  More data exists      08/08/2022 02/01/2022 12/29/2021 09/29/2021 03/07/2018  Depression screen PHQ 2/9  Decreased Interest 0 0 0 0 0  Down, Depressed, Hopeless 1 0 1 0 -  PHQ -  2 Score 1 0 1 0 0  Altered sleeping 0 - - 0 -  Tired, decreased energy 3 - - 3 -  Change in appetite 2 - - 1 -  Feeling bad or failure about yourself  0 - - 2 -  Trouble concentrating 0 - - 1 -  Moving slowly or fidgety/restless 0 - - 0 -  Suicidal thoughts 0 - - 0 -  PHQ-9 Score 6 - - 7 -  Difficult doing work/chores Somewhat difficult - - Somewhat difficult -   Interpretation of Total Score  Total Score Depression Severity:  1-4 = Minimal depression, 5-9 = Mild depression, 10-14 = Moderate depression, 15-19 = Moderately severe depression, 20-27 = Severe depression   Psychosocial Evaluation and Intervention:  Psychosocial Evaluation - 08/08/22 0911       Psychosocial Evaluation & Interventions   Interventions Stress management education;Relaxation education;Encouraged to exercise with the program and follow exercise prescription    Comments Pt has no barriers to participating in CR. He has no identiable psychosocial issues. He scored a 6 on a PHQ-9 and this relates to his chronic health conditions. He denies any depression. He reports that 2023 was a rough year for him. He had coronary  stents placed in January, June, and July. He then had a CABG x 2 on his birthday in November. He reports he has felt weak since his surgery, and now he has to walk with a cane due to this. He is optimistic that 2024 will be a better year. He continues to cope well with his pertioneal dialysis that he has been undergoing for about 7 years now. He lists his wife as his support system. His goals while in the program are to increase his strength and stamina and to be healthier. He previously completed 29 sessions of CR, but he was discharged in early June of 2023 due to ongoing anginal symptoms. He is excited to be back in the program and to get his strength back.    Expected Outcomes Patient will continue to have no psychosocial barriers or issues identified.    Continue Psychosocial Services  No Follow up required             Psychosocial Re-Evaluation:  Psychosocial Re-Evaluation     Indust[employer] Name 09/06/22 1247             Psychosocial Re-Evaluation   Current issues with None Identified       Comments Patient has completed 10 sessions and is doing well. He continues to have no psychosocial barriers identified. He enjoys the sessions and demonstrates an interest in improving his health. We will continue to monitor.       Expected Outcomes Patient will continue to have no psychosocial barriers or issues identified.       Interventions Encouraged to attend Cardiac Rehabilitation for the exercise;Stress management education;Relaxation education       Continue Psychosocial Services  No Follow up required                Psychosocial Discharge (Final Psychosocial Re-Evaluation):  Psychosocial Re-Evaluation - 09/06/22 1247       Psychosocial Re-Evaluation   Current issues with None Identified    Comments Patient has completed 10 sessions and is doing well. He continues to have no psychosocial barriers identified. He enjoys the sessions and demonstrates an interest in improving his health. We  will continue to monitor.    Expected Outcomes Patient will continue to have no psychosocial  barriers or issues identified.    Interventions Encouraged to attend Cardiac Rehabilitation for the exercise;Stress management education;Relaxation education    Continue Psychosocial Services  No Follow up required             Vocational Rehabilitation: Provide vocational rehab assistance to qualifying candidates.   Vocational Rehab Evaluation & Intervention:  Vocational Rehab - 08/08/22 0825       Initial Vocational Rehab Evaluation & Intervention   Assessment shows need for Vocational Rehabilitation No      Vocational Rehab Re-Evaulation   Comments Patient is disabled and does not need voacational rehab.             Education: Education Goals: Education classes will be provided on a weekly basis, covering required topics. Participant will state understanding/return demonstration of topics presented.  Learning Barriers/Preferences:  Learning Barriers/Preferences - 08/08/22 0847       Learning Barriers/Preferences   Learning Barriers None    Learning Preferences Skilled Demonstration             Education Topics: Hypertension, Hypertension Reduction -Define heart disease and high blood pressure. Discus how high blood pressure affects the body and ways to reduce high blood pressure. Flowsheet Row CARDIAC REHAB PHASE II EXERCISE from 11/30/2021 in [place]  Date 10/19/21  Educator DM  Instruction Review Code 1- Verbalizes Understanding       Exercise and Your Heart -Discuss why it is important to exercise, the FITT principles of exercise, normal and abnormal responses to exercise, and how to exercise safely. Flowsheet Row CARDIAC REHAB PHASE II EXERCISE from 11/30/2021 in [place]  Date 10/26/21  Educator [place]  Instruction Review Code 1- Verbalizes Understanding       Angina -Discuss definition of angina, causes  of angina, treatment of angina, and how to decrease risk of having angina.   Cardiac Medications -Review what the following cardiac medications are used for, how they affect the body, and side effects that may occur when taking the medications.  Medications include Aspirin, Beta blockers, calcium channel blockers, ACE Inhibitors, angiotensin receptor blockers, diuretics, digoxin, and antihyperlipidemics. Flowsheet Row CARDIAC REHAB PHASE II EXERCISE from 09/06/2022 in [place]  Date 08/16/22  Educator [place]  Instruction Review Code 1- Verbalizes Understanding       Congestive Heart Failure -Discuss the definition of CHF, how to live with CHF, the signs and symptoms of CHF, and how keep track of weight and sodium intake. Flowsheet Row CARDIAC REHAB PHASE II EXERCISE from 11/30/2021 in [name]  Date 11/16/21  Educator hj  Instruction Review Code 2- Demonstrated Understanding       Heart Disease and Intimacy -Discus the effect sexual activity has on the heart, how changes occur during intimacy as we age, and safety during sexual activity. Flowsheet Row CARDIAC REHAB PHASE II EXERCISE from 11/30/2021 in [place]  Date 11/23/21  Educator pb  Instruction Review Code 1- Verbalizes Understanding       Smoking Cessation / COPD -Discuss different methods to quit smoking, the health benefits of quitting smoking, and the definition of COPD. Flowsheet Row CARDIAC REHAB PHASE II EXERCISE from 09/06/2022 in [place]  Date 09/06/22  Educator [place]  Instruction Review Code 1- Verbalizes Understanding       Nutrition I: Fats -Discuss the types of cholesterol, what cholesterol does to the heart, and how cholesterol levels can be controlled.   Nutrition II: Labels -  Discuss the different components of food labels and how to read food label   Heart Parts/Heart Disease and PAD -Discuss the anatomy  of the heart, the pathway of blood circulation through the heart, and these are affected by heart disease.   Stress I: Signs and Symptoms -Discuss the causes of stress, how stress may lead to anxiety and depression, and ways to limit stress.   Stress II: Relaxation -Discuss different types of relaxation techniques to limit stress. Flowsheet Row CARDIAC REHAB PHASE II EXERCISE from 10/05/2021 in [place]  Date 10/05/21  Educator pb  Instruction Review Code 1- Verbalizes Understanding       Warning Signs of Stroke / TIA -Discuss definition of a stroke, what the signs and symptoms are of a stroke, and how to identify when someone is having stroke. Flowsheet Row CARDIAC REHAB PHASE II EXERCISE from 11/30/2021 in [place]  Date 10/12/21  Educator [place]  Instruction Review Code 1- Verbalizes Understanding       Knowledge Questionnaire Score:  Knowledge Questionnaire Score - 08/08/22 0848       Knowledge Questionnaire Score   Pre Score 21/24             Core Components/Risk Factors/Patient Goals at Admission:  Personal Goals and Risk Factors at Admission - 08/08/22 0849       Core Components/Risk Factors/Patient Goals on Admission    Weight Management Obesity;Yes    Intervention Obesity: Provide education and appropriate resources to help participant work on and attain dietary goals.    Expected Outcomes Understanding of distribution of calorie intake throughout the day with the consumption of 4-5 meals/snacks;Understanding recommendations for meals to include 15-35% energy as protein, 25-35% energy from fat, 35-60% energy from carbohydrates, less than '200mg'$  of dietary cholesterol, 20-35 gm of total fiber daily;Long Term: Adherence to nutrition and physical activity/exercise program aimed toward attainment of established weight goal;Short Term: Continue to assess and modify interventions until short term weight is achieved     Improve shortness of breath with ADL's Yes    Intervention Provide education, individualized exercise plan and daily activity instruction to help decrease symptoms of SOB with activities of daily living.    Expected Outcomes Short Term: Improve cardiorespiratory fitness to achieve a reduction of symptoms when performing ADLs;Long Term: Be able to perform more ADLs without symptoms or delay the onset of symptoms    Diabetes Yes    Intervention Provide education about signs/symptoms and action to take for hypo/hyperglycemia.;Provide education about proper nutrition, including hydration, and aerobic/resistive exercise prescription along with prescribed medications to achieve blood glucose in normal ranges: Fasting glucose 65-99 mg/dL    Expected Outcomes Short Term: Participant verbalizes understanding of the signs/symptoms and immediate care of hyper/hypoglycemia, proper foot care and importance of medication, aerobic/resistive exercise and nutrition plan for blood glucose control.;Long Term: Attainment of HbA1C < 7%.    Personal Goal Other Yes    Personal Goal Improve strength and stamina, be healthier    Intervention Patient will attend the CR program 3 days/week with exercise and education and supplement with exercise at home.    Expected Outcomes Patient will complete the program meeting both personal and program goals.             Core Components/Risk Factors/Patient Goals Review:   Goals and Risk Factor Review     Row Name 08/09/22 1040 09/06/22 1249           Core Components/Risk Factors/Patient Goals  Review   Personal Goals Review Weight Management/Obesity;Diabetes;Improve shortness of breath with ADL's;Other Weight Management/Obesity;Diabetes;Improve shortness of breath with ADL's;Other      Review Patient referred to CR with NSTEMI/CABGx2. He has multiple risk factors for CAD and is participating in the program again for risk modification. He was not able to complete the program  due chest pain. He plans to start the program next week. His DM is managed with insulin. His last A1C on file was 01/13/22 at 6.3. His personal goals for the program are to increase his strength and stamina and to be healthier overall. Patient has completed 10 sessions. He is doing well in the program with consistent attendance and progressions. He blood pressure is above goal at times. We will continue to monitor. His DM continues to be managed with insulin. His last A1C on file was 01/13/22 at 6.3. He saw urologist 2/5 and was treated for UTI. He has to miss 2 sessions for CBG below our parameters for exercise. He was encouraged to eat something before he comes to sessions. His personal goals for the program are to increase his strength and stamina and to be healthier overall.      Expected Outcomes Patient will complete the program meeting both program and personal goals. Patient will complete the program meeting both program and personal goals.               Core Components/Risk Factors/Patient Goals at Discharge (Final Review):   Goals and Risk Factor Review - 09/06/22 1249       Core Components/Risk Factors/Patient Goals Review   Personal Goals Review Weight Management/Obesity;Diabetes;Improve shortness of breath with ADL's;Other    Review Patient has completed 10 sessions. He is doing well in the program with consistent attendance and progressions. He blood pressure is above goal at times. We will continue to monitor. His DM continues to be managed with insulin. His last A1C on file was 01/13/22 at 6.3. He saw urologist 2/5 and was treated for UTI. He has to miss 2 sessions for CBG below our parameters for exercise. He was encouraged to eat something before he comes to sessions. His personal goals for the program are to increase his strength and stamina and to be healthier overall.    Expected Outcomes Patient will complete the program meeting both program and personal goals.              ITP Comments:   Comments: ITP REVIEW Pt is making expected progress toward Cardiac Rehab goals after completing 12 sessions. Recommend continued exercise, life style modification, education, and increased stamina and strength.

## 2022-09-13 NOTE — Telephone Encounter (Signed)
-----   Message from Marcial Pacas, MD sent at 09/13/2022 10:24 AM EST ----- Please call patient, laboratory evaluation showed no significant abnormalities.

## 2022-09-14 ENCOUNTER — Telehealth: Payer: Self-pay | Admitting: Neurology

## 2022-09-14 DIAGNOSIS — N186 End stage renal disease: Secondary | ICD-10-CM | POA: Diagnosis not present

## 2022-09-14 DIAGNOSIS — D631 Anemia in chronic kidney disease: Secondary | ICD-10-CM | POA: Diagnosis not present

## 2022-09-14 DIAGNOSIS — D509 Iron deficiency anemia, unspecified: Secondary | ICD-10-CM | POA: Diagnosis not present

## 2022-09-14 DIAGNOSIS — Z992 Dependence on renal dialysis: Secondary | ICD-10-CM | POA: Diagnosis not present

## 2022-09-14 DIAGNOSIS — N250 Renal osteodystrophy: Secondary | ICD-10-CM | POA: Diagnosis not present

## 2022-09-14 NOTE — Telephone Encounter (Signed)
medicare NPR sent to [facility] [phone]

## 2022-09-15 ENCOUNTER — Encounter (HOSPITAL_COMMUNITY)
Admission: RE | Admit: 2022-09-15 | Discharge: 2022-09-15 | Disposition: A | Discharge status: Home / Self Care | Payer: Medicare Other | Source: Ambulatory Visit | Attending: Internal Medicine | Admitting: Internal Medicine

## 2022-09-15 DIAGNOSIS — I214 Non-ST elevation (NSTEMI) myocardial infarction: Secondary | ICD-10-CM | POA: Insufficient documentation

## 2022-09-15 DIAGNOSIS — Z9861 Coronary angioplasty status: Secondary | ICD-10-CM | POA: Diagnosis not present

## 2022-09-15 DIAGNOSIS — Z955 Presence of coronary angioplasty implant and graft: Secondary | ICD-10-CM | POA: Insufficient documentation

## 2022-09-15 DIAGNOSIS — Z951 Presence of aortocoronary bypass graft: Secondary | ICD-10-CM

## 2022-09-15 DIAGNOSIS — N186 End stage renal disease: Secondary | ICD-10-CM | POA: Diagnosis not present

## 2022-09-15 DIAGNOSIS — Z992 Dependence on renal dialysis: Secondary | ICD-10-CM | POA: Diagnosis not present

## 2022-09-15 DIAGNOSIS — D509 Iron deficiency anemia, unspecified: Secondary | ICD-10-CM | POA: Diagnosis not present

## 2022-09-15 DIAGNOSIS — N250 Renal osteodystrophy: Secondary | ICD-10-CM | POA: Diagnosis not present

## 2022-09-15 DIAGNOSIS — D631 Anemia in chronic kidney disease: Secondary | ICD-10-CM | POA: Diagnosis not present

## 2022-09-15 NOTE — Progress Notes (Signed)
Daily Session Note  Patient Details  Name: [name] [name] MRN: [id number] Date of Birth: 1951-04-29 Referring Provider:   Flowsheet Row CARDIAC REHAB PHASE II ORIENTATION from 08/08/2022 in [place]  Referring Provider Dr. Kipp Brood       Encounter Date: 09/15/2022  Check In:  Session Check In - 09/15/22 1052       Check-In   Supervising physician immediately available to respond to emergencies [facility] MD immediately available    Physician(s) Dr. Harl Bowie    Location [facility]    Staff Present Leana Roe, BS, Exercise Physiologist;Debra Wynetta Emery, RN, Joanette Gula, RN, BSN    Virtual Visit No    Medication changes reported     No    Fall or balance concerns reported    Yes    Comments He has not fallen, but he is currently walking with a cain due to poor balance and deconditioning.    Tobacco Cessation No Change    Warm-up and Cool-down Performed as group-led instruction    Resistance Training Performed Yes    VAD Patient? No    PAD/SET Patient? No      Pain Assessment   Currently in Pain? No/denies    Pain Score 0-No pain    Multiple Pain Sites No             Capillary Blood Glucose: No results found for this or any previous visit (from the past 24 hour(s)).    Social History   Tobacco Use  Smoking Status Former   Packs/day: 1.00   Years: 25.00   Total pack years: 25.00   Types: Cigarettes   Quit date: 03/27/2010   Years since quitting: 12.4  Smokeless Tobacco Never  Tobacco Comments   Quit x 7 years    Goals Met:  Independence with exercise equipment Exercise tolerated well No report of concerns or symptoms today Strength training completed today  Goals Unmet:  Not Applicable  Comments:   Checkout at 1200.   Dr. Carlyle Dolly is Medical Director for [facility]

## 2022-09-16 ENCOUNTER — Emergency Department (HOSPITAL_COMMUNITY): Payer: No Typology Code available for payment source

## 2022-09-16 ENCOUNTER — Emergency Department (HOSPITAL_COMMUNITY)
Admission: EM | Admit: 2022-09-16 | Discharge: 2022-09-16 | Disposition: A | Discharge status: Home / Self Care | Payer: No Typology Code available for payment source | Attending: Student | Admitting: Student

## 2022-09-16 ENCOUNTER — Encounter (HOSPITAL_COMMUNITY): Payer: Self-pay

## 2022-09-16 ENCOUNTER — Other Ambulatory Visit: Payer: Self-pay

## 2022-09-16 DIAGNOSIS — Y92410 Unspecified street and highway as the place of occurrence of the external cause: Secondary | ICD-10-CM | POA: Diagnosis not present

## 2022-09-16 DIAGNOSIS — Z794 Long term (current) use of insulin: Secondary | ICD-10-CM | POA: Insufficient documentation

## 2022-09-16 DIAGNOSIS — N250 Renal osteodystrophy: Secondary | ICD-10-CM | POA: Diagnosis not present

## 2022-09-16 DIAGNOSIS — Z7982 Long term (current) use of aspirin: Secondary | ICD-10-CM | POA: Insufficient documentation

## 2022-09-16 DIAGNOSIS — M545 Low back pain, unspecified: Secondary | ICD-10-CM | POA: Diagnosis not present

## 2022-09-16 DIAGNOSIS — S80911A Unspecified superficial injury of right knee, initial encounter: Secondary | ICD-10-CM | POA: Diagnosis not present

## 2022-09-16 DIAGNOSIS — N186 End stage renal disease: Secondary | ICD-10-CM | POA: Diagnosis not present

## 2022-09-16 DIAGNOSIS — Z955 Presence of coronary angioplasty implant and graft: Secondary | ICD-10-CM | POA: Insufficient documentation

## 2022-09-16 DIAGNOSIS — I120 Hypertensive chronic kidney disease with stage 5 chronic kidney disease or end stage renal disease: Secondary | ICD-10-CM | POA: Insufficient documentation

## 2022-09-16 DIAGNOSIS — Z992 Dependence on renal dialysis: Secondary | ICD-10-CM | POA: Diagnosis not present

## 2022-09-16 DIAGNOSIS — R079 Chest pain, unspecified: Secondary | ICD-10-CM | POA: Diagnosis not present

## 2022-09-16 DIAGNOSIS — D509 Iron deficiency anemia, unspecified: Secondary | ICD-10-CM | POA: Diagnosis not present

## 2022-09-16 DIAGNOSIS — R0789 Other chest pain: Secondary | ICD-10-CM | POA: Diagnosis not present

## 2022-09-16 DIAGNOSIS — S3992XA Unspecified injury of lower back, initial encounter: Secondary | ICD-10-CM | POA: Diagnosis not present

## 2022-09-16 DIAGNOSIS — I10 Essential (primary) hypertension: Secondary | ICD-10-CM | POA: Diagnosis not present

## 2022-09-16 DIAGNOSIS — D631 Anemia in chronic kidney disease: Secondary | ICD-10-CM | POA: Diagnosis not present

## 2022-09-16 DIAGNOSIS — E1122 Type 2 diabetes mellitus with diabetic chronic kidney disease: Secondary | ICD-10-CM | POA: Diagnosis not present

## 2022-09-16 DIAGNOSIS — N185 Chronic kidney disease, stage 5: Secondary | ICD-10-CM | POA: Insufficient documentation

## 2022-09-16 DIAGNOSIS — M25561 Pain in right knee: Secondary | ICD-10-CM | POA: Diagnosis not present

## 2022-09-16 LAB — COMPREHENSIVE METABOLIC PANEL
ALT: 15 U/L (ref 0–44)
AST: 16 U/L (ref 15–41)
Albumin: 3.3 g/dL — ABNORMAL LOW (ref 3.5–5.0)
Alkaline Phosphatase: 124 U/L (ref 38–126)
Anion gap: 14 (ref 5–15)
BUN: 56 mg/dL — ABNORMAL HIGH (ref 8–23)
CO2: 25 mmol/L (ref 22–32)
Calcium: 9.6 mg/dL (ref 8.9–10.3)
Chloride: 96 mmol/L — ABNORMAL LOW (ref 98–111)
Creatinine, Ser: 12.85 mg/dL — ABNORMAL HIGH (ref 0.61–1.24)
GFR, Estimated: 4 mL/min — ABNORMAL LOW (ref >60)
Glucose, Bld: 117 mg/dL — ABNORMAL HIGH (ref 70–99)
Potassium: 4 mmol/L (ref 3.5–5.1)
Sodium: 135 mmol/L (ref 135–145)
Total Bilirubin: 0.6 mg/dL (ref 0.3–1.2)
Total Protein: 7.1 g/dL (ref 6.5–8.1)

## 2022-09-16 LAB — CBC WITH DIFFERENTIAL/PLATELET
Abs Immature Granulocytes: 0.02 10*3/uL (ref 0.00–0.07)
Basophils Absolute: 0 10*3/uL (ref 0.0–0.1)
Basophils Relative: 1 %
Eosinophils Absolute: 0.1 10*3/uL (ref 0.0–0.5)
Eosinophils Relative: 3 %
HCT: 32.3 % — ABNORMAL LOW (ref 39.0–52.0)
Hemoglobin: 9.7 g/dL — ABNORMAL LOW (ref 13.0–17.0)
Immature Granulocytes: 0 %
Lymphocytes Relative: 15 %
Lymphs Abs: 0.8 10*3/uL (ref 0.7–4.0)
MCH: 27.7 pg (ref 26.0–34.0)
MCHC: 30 g/dL (ref 30.0–36.0)
MCV: 92.3 fL (ref 80.0–100.0)
Monocytes Absolute: 0.4 10*3/uL (ref 0.1–1.0)
Monocytes Relative: 7 %
Neutro Abs: 3.9 10*3/uL (ref 1.7–7.7)
Neutrophils Relative %: 74 %
Platelets: 128 10*3/uL — ABNORMAL LOW (ref 150–400)
RBC: 3.5 MIL/uL — ABNORMAL LOW (ref 4.22–5.81)
RDW: 19.9 % — ABNORMAL HIGH (ref 11.5–15.5)
WBC: 5.2 10*3/uL (ref 4.0–10.5)
nRBC: 0.6 % — ABNORMAL HIGH (ref 0.0–0.2)
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
## 2022-09-17 DIAGNOSIS — D509 Iron deficiency anemia, unspecified: Secondary | ICD-10-CM | POA: Diagnosis not present

## 2022-09-17 DIAGNOSIS — N186 End stage renal disease: Secondary | ICD-10-CM | POA: Diagnosis not present

## 2022-09-17 DIAGNOSIS — Z992 Dependence on renal dialysis: Secondary | ICD-10-CM | POA: Diagnosis not present

## 2022-09-17 DIAGNOSIS — N250 Renal osteodystrophy: Secondary | ICD-10-CM | POA: Diagnosis not present

## 2022-09-17 DIAGNOSIS — D631 Anemia in chronic kidney disease: Secondary | ICD-10-CM | POA: Diagnosis not present

## 2022-09-18 ENCOUNTER — Encounter (HOSPITAL_COMMUNITY): Payer: Medicare Other

## 2022-09-18 DIAGNOSIS — D509 Iron deficiency anemia, unspecified: Secondary | ICD-10-CM | POA: Diagnosis not present

## 2022-09-18 DIAGNOSIS — Z992 Dependence on renal dialysis: Secondary | ICD-10-CM | POA: Diagnosis not present

## 2022-09-18 DIAGNOSIS — N186 End stage renal disease: Secondary | ICD-10-CM | POA: Diagnosis not present

## 2022-09-18 DIAGNOSIS — D631 Anemia in chronic kidney disease: Secondary | ICD-10-CM | POA: Diagnosis not present

## 2022-09-18 DIAGNOSIS — N250 Renal osteodystrophy: Secondary | ICD-10-CM | POA: Diagnosis not present

## 2022-09-19 ENCOUNTER — Other Ambulatory Visit: Payer: Medicare Other

## 2022-09-19 DIAGNOSIS — Z992 Dependence on renal dialysis: Secondary | ICD-10-CM | POA: Diagnosis not present

## 2022-09-19 DIAGNOSIS — N250 Renal osteodystrophy: Secondary | ICD-10-CM | POA: Diagnosis not present

## 2022-09-19 DIAGNOSIS — D509 Iron deficiency anemia, unspecified: Secondary | ICD-10-CM | POA: Diagnosis not present

## 2022-09-19 DIAGNOSIS — N186 End stage renal disease: Secondary | ICD-10-CM | POA: Diagnosis not present

## 2022-09-19 DIAGNOSIS — D631 Anemia in chronic kidney disease: Secondary | ICD-10-CM | POA: Diagnosis not present

## 2022-09-20 ENCOUNTER — Encounter: Payer: Self-pay | Admitting: Internal Medicine

## 2022-09-20 ENCOUNTER — Encounter (HOSPITAL_COMMUNITY): Payer: Medicare Other

## 2022-09-20 ENCOUNTER — Ambulatory Visit: Payer: Medicare Other | Attending: Internal Medicine | Admitting: Internal Medicine

## 2022-09-20 VITALS — BP 138/66 | HR 56 | Ht 64.0 in | Wt 227.0 lb

## 2022-09-20 DIAGNOSIS — Z992 Dependence on renal dialysis: Secondary | ICD-10-CM | POA: Insufficient documentation

## 2022-09-20 DIAGNOSIS — I25118 Atherosclerotic heart disease of native coronary artery with other forms of angina pectoris: Secondary | ICD-10-CM

## 2022-09-20 DIAGNOSIS — Z951 Presence of aortocoronary bypass graft: Secondary | ICD-10-CM | POA: Diagnosis not present

## 2022-09-20 DIAGNOSIS — G4733 Obstructive sleep apnea (adult) (pediatric): Secondary | ICD-10-CM

## 2022-09-20 DIAGNOSIS — N250 Renal osteodystrophy: Secondary | ICD-10-CM | POA: Diagnosis not present

## 2022-09-20 DIAGNOSIS — N186 End stage renal disease: Secondary | ICD-10-CM

## 2022-09-20 DIAGNOSIS — D509 Iron deficiency anemia, unspecified: Secondary | ICD-10-CM | POA: Diagnosis not present

## 2022-09-20 DIAGNOSIS — D631 Anemia in chronic kidney disease: Secondary | ICD-10-CM | POA: Diagnosis not present

## 2022-09-20 NOTE — Patient Instructions (Signed)
Medication Instructions:  Your physician recommends that you continue on your current medications as directed. Please refer to the Current Medication list given to you today.  *If you need a refill on your cardiac medications before your next appointment, please call your pharmacy*    Follow-Up: At [facility], you and your health needs are our priority.  As part of our continuing mission to provide you with exceptional heart care, we have created designated Provider Care Teams.  These Care Teams include your primary Cardiologist (physician) and Advanced Practice Providers (APPs -  Physician Assistants and Nurse Practitioners) who all work together to provide you with the care you need, when you need it.  We recommend signing up for the patient portal called "MyChart".  Sign up information is provided on this After Visit Summary.  MyChart is used to connect with patients for Virtual Visits (Telemedicine).  Patients are able to view lab/test results, encounter notes, upcoming appointments, etc.  Non-urgent messages can be sent to your provider as well.   To learn more about what you can do with MyChart, go to [email or web address].    Your next appointment:    6 months with Dr. Debara Pickett
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
## 2022-09-21 DIAGNOSIS — D631 Anemia in chronic kidney disease: Secondary | ICD-10-CM | POA: Diagnosis not present

## 2022-09-21 DIAGNOSIS — D509 Iron deficiency anemia, unspecified: Secondary | ICD-10-CM | POA: Diagnosis not present

## 2022-09-21 DIAGNOSIS — N250 Renal osteodystrophy: Secondary | ICD-10-CM | POA: Diagnosis not present

## 2022-09-21 DIAGNOSIS — Z992 Dependence on renal dialysis: Secondary | ICD-10-CM | POA: Diagnosis not present

## 2022-09-21 DIAGNOSIS — N186 End stage renal disease: Secondary | ICD-10-CM | POA: Diagnosis not present

## 2022-09-22 ENCOUNTER — Encounter (HOSPITAL_COMMUNITY): Payer: Medicare Other

## 2022-09-22 DIAGNOSIS — D509 Iron deficiency anemia, unspecified: Secondary | ICD-10-CM | POA: Diagnosis not present

## 2022-09-22 DIAGNOSIS — N250 Renal osteodystrophy: Secondary | ICD-10-CM | POA: Diagnosis not present

## 2022-09-22 DIAGNOSIS — N186 End stage renal disease: Secondary | ICD-10-CM | POA: Diagnosis not present

## 2022-09-22 DIAGNOSIS — D631 Anemia in chronic kidney disease: Secondary | ICD-10-CM | POA: Diagnosis not present

## 2022-09-22 DIAGNOSIS — Z992 Dependence on renal dialysis: Secondary | ICD-10-CM | POA: Diagnosis not present

## 2022-09-23 DIAGNOSIS — N250 Renal osteodystrophy: Secondary | ICD-10-CM | POA: Diagnosis not present

## 2022-09-23 DIAGNOSIS — D631 Anemia in chronic kidney disease: Secondary | ICD-10-CM | POA: Diagnosis not present

## 2022-09-23 DIAGNOSIS — D509 Iron deficiency anemia, unspecified: Secondary | ICD-10-CM | POA: Diagnosis not present

## 2022-09-23 DIAGNOSIS — Z992 Dependence on renal dialysis: Secondary | ICD-10-CM | POA: Diagnosis not present

## 2022-09-23 DIAGNOSIS — N186 End stage renal disease: Secondary | ICD-10-CM | POA: Diagnosis not present

## 2022-09-24 DIAGNOSIS — D509 Iron deficiency anemia, unspecified: Secondary | ICD-10-CM | POA: Diagnosis not present

## 2022-09-24 DIAGNOSIS — N186 End stage renal disease: Secondary | ICD-10-CM | POA: Diagnosis not present

## 2022-09-24 DIAGNOSIS — Z992 Dependence on renal dialysis: Secondary | ICD-10-CM | POA: Diagnosis not present

## 2022-09-24 DIAGNOSIS — N250 Renal osteodystrophy: Secondary | ICD-10-CM | POA: Diagnosis not present

## 2022-09-24 DIAGNOSIS — D631 Anemia in chronic kidney disease: Secondary | ICD-10-CM | POA: Diagnosis not present

## 2022-09-25 ENCOUNTER — Encounter (HOSPITAL_COMMUNITY): Payer: Medicare Other

## 2022-09-25 DIAGNOSIS — N250 Renal osteodystrophy: Secondary | ICD-10-CM | POA: Diagnosis not present

## 2022-09-25 DIAGNOSIS — Z992 Dependence on renal dialysis: Secondary | ICD-10-CM | POA: Diagnosis not present

## 2022-09-25 DIAGNOSIS — D509 Iron deficiency anemia, unspecified: Secondary | ICD-10-CM | POA: Diagnosis not present

## 2022-09-25 DIAGNOSIS — D631 Anemia in chronic kidney disease: Secondary | ICD-10-CM | POA: Diagnosis not present

## 2022-09-25 DIAGNOSIS — N186 End stage renal disease: Secondary | ICD-10-CM | POA: Diagnosis not present

## 2022-09-26 DIAGNOSIS — D631 Anemia in chronic kidney disease: Secondary | ICD-10-CM | POA: Diagnosis not present

## 2022-09-26 DIAGNOSIS — B351 Tinea unguium: Secondary | ICD-10-CM | POA: Diagnosis not present

## 2022-09-26 DIAGNOSIS — L84 Corns and callosities: Secondary | ICD-10-CM | POA: Diagnosis not present

## 2022-09-26 DIAGNOSIS — E1142 Type 2 diabetes mellitus with diabetic polyneuropathy: Secondary | ICD-10-CM | POA: Diagnosis not present

## 2022-09-26 DIAGNOSIS — N250 Renal osteodystrophy: Secondary | ICD-10-CM | POA: Diagnosis not present

## 2022-09-26 DIAGNOSIS — Z992 Dependence on renal dialysis: Secondary | ICD-10-CM | POA: Diagnosis not present

## 2022-09-26 DIAGNOSIS — D509 Iron deficiency anemia, unspecified: Secondary | ICD-10-CM | POA: Diagnosis not present

## 2022-09-26 DIAGNOSIS — N186 End stage renal disease: Secondary | ICD-10-CM | POA: Diagnosis not present

## 2022-09-27 ENCOUNTER — Ambulatory Visit (HOSPITAL_COMMUNITY)
Admission: RE | Admit: 2022-09-27 | Discharge: 2022-09-27 | Disposition: A | Discharge status: Home / Self Care | Payer: Medicare Other | Source: Ambulatory Visit | Attending: Urology | Admitting: Urology

## 2022-09-27 ENCOUNTER — Encounter (HOSPITAL_COMMUNITY): Payer: Medicare Other

## 2022-09-27 DIAGNOSIS — N186 End stage renal disease: Secondary | ICD-10-CM | POA: Diagnosis not present

## 2022-09-27 DIAGNOSIS — R413 Other amnesia: Secondary | ICD-10-CM | POA: Diagnosis not present

## 2022-09-27 DIAGNOSIS — R269 Unspecified abnormalities of gait and mobility: Secondary | ICD-10-CM | POA: Diagnosis not present

## 2022-09-27 DIAGNOSIS — Z992 Dependence on renal dialysis: Secondary | ICD-10-CM | POA: Diagnosis not present

## 2022-09-27 DIAGNOSIS — J3489 Other specified disorders of nose and nasal sinuses: Secondary | ICD-10-CM | POA: Diagnosis not present

## 2022-09-27 DIAGNOSIS — N250 Renal osteodystrophy: Secondary | ICD-10-CM | POA: Diagnosis not present

## 2022-09-27 DIAGNOSIS — R3129 Other microscopic hematuria: Secondary | ICD-10-CM | POA: Insufficient documentation

## 2022-09-27 DIAGNOSIS — D631 Anemia in chronic kidney disease: Secondary | ICD-10-CM | POA: Diagnosis not present

## 2022-09-27 DIAGNOSIS — R310 Gross hematuria: Secondary | ICD-10-CM | POA: Diagnosis not present

## 2022-09-27 DIAGNOSIS — R251 Tremor, unspecified: Secondary | ICD-10-CM | POA: Diagnosis not present

## 2022-09-27 DIAGNOSIS — D509 Iron deficiency anemia, unspecified: Secondary | ICD-10-CM | POA: Diagnosis not present

## 2022-09-28 DIAGNOSIS — D509 Iron deficiency anemia, unspecified: Secondary | ICD-10-CM | POA: Diagnosis not present

## 2022-09-28 DIAGNOSIS — D631 Anemia in chronic kidney disease: Secondary | ICD-10-CM | POA: Diagnosis not present

## 2022-09-28 DIAGNOSIS — N186 End stage renal disease: Secondary | ICD-10-CM | POA: Diagnosis not present

## 2022-09-28 DIAGNOSIS — N250 Renal osteodystrophy: Secondary | ICD-10-CM | POA: Diagnosis not present

## 2022-09-28 DIAGNOSIS — Z992 Dependence on renal dialysis: Secondary | ICD-10-CM | POA: Diagnosis not present

## 2022-09-29 ENCOUNTER — Encounter (HOSPITAL_COMMUNITY): Payer: Medicare Other

## 2022-09-29 DIAGNOSIS — Z992 Dependence on renal dialysis: Secondary | ICD-10-CM | POA: Diagnosis not present

## 2022-09-29 DIAGNOSIS — N250 Renal osteodystrophy: Secondary | ICD-10-CM | POA: Diagnosis not present

## 2022-09-29 DIAGNOSIS — D631 Anemia in chronic kidney disease: Secondary | ICD-10-CM | POA: Diagnosis not present

## 2022-09-29 DIAGNOSIS — N186 End stage renal disease: Secondary | ICD-10-CM | POA: Diagnosis not present

## 2022-09-29 DIAGNOSIS — D509 Iron deficiency anemia, unspecified: Secondary | ICD-10-CM | POA: Diagnosis not present

## 2022-09-30 DIAGNOSIS — D509 Iron deficiency anemia, unspecified: Secondary | ICD-10-CM | POA: Diagnosis not present

## 2022-09-30 DIAGNOSIS — Z992 Dependence on renal dialysis: Secondary | ICD-10-CM | POA: Diagnosis not present

## 2022-09-30 DIAGNOSIS — N186 End stage renal disease: Secondary | ICD-10-CM | POA: Diagnosis not present

## 2022-09-30 DIAGNOSIS — D631 Anemia in chronic kidney disease: Secondary | ICD-10-CM | POA: Diagnosis not present

## 2022-09-30 DIAGNOSIS — N250 Renal osteodystrophy: Secondary | ICD-10-CM | POA: Diagnosis not present

## 2022-10-01 DIAGNOSIS — N250 Renal osteodystrophy: Secondary | ICD-10-CM | POA: Diagnosis not present

## 2022-10-01 DIAGNOSIS — D631 Anemia in chronic kidney disease: Secondary | ICD-10-CM | POA: Diagnosis not present

## 2022-10-01 DIAGNOSIS — N186 End stage renal disease: Secondary | ICD-10-CM | POA: Diagnosis not present

## 2022-10-01 DIAGNOSIS — Z992 Dependence on renal dialysis: Secondary | ICD-10-CM | POA: Diagnosis not present

## 2022-10-01 DIAGNOSIS — D509 Iron deficiency anemia, unspecified: Secondary | ICD-10-CM | POA: Diagnosis not present

## 2022-10-02 ENCOUNTER — Encounter (HOSPITAL_COMMUNITY): Payer: Medicare Other

## 2022-10-02 ENCOUNTER — Ambulatory Visit (INDEPENDENT_AMBULATORY_CARE_PROVIDER_SITE_OTHER): Payer: Medicare Other | Admitting: Urology

## 2022-10-02 VITALS — BP 138/81 | HR 76 | Ht 64.0 in | Wt 227.0 lb

## 2022-10-02 DIAGNOSIS — N186 End stage renal disease: Secondary | ICD-10-CM | POA: Diagnosis not present

## 2022-10-02 DIAGNOSIS — D509 Iron deficiency anemia, unspecified: Secondary | ICD-10-CM | POA: Diagnosis not present

## 2022-10-02 DIAGNOSIS — R3129 Other microscopic hematuria: Secondary | ICD-10-CM | POA: Diagnosis not present

## 2022-10-02 DIAGNOSIS — N250 Renal osteodystrophy: Secondary | ICD-10-CM | POA: Diagnosis not present

## 2022-10-02 DIAGNOSIS — D631 Anemia in chronic kidney disease: Secondary | ICD-10-CM | POA: Diagnosis not present

## 2022-10-02 DIAGNOSIS — Z992 Dependence on renal dialysis: Secondary | ICD-10-CM | POA: Diagnosis not present

## 2022-10-02 MED ORDER — CIPROFLOXACIN HCL 500 MG PO TABS
500.0000 mg | ORAL_TABLET | Freq: Once | ORAL | Status: AC
  Administered 2022-10-02: 500 mg via ORAL

## 2022-10-02 NOTE — Addendum Note (Signed)
Encounter addended by: Dwana Melena, RN on: 10/02/2022 2:19 PM  Actions taken: Flowsheet data copied forward, Flowsheet accepted

## 2022-10-02 NOTE — Progress Notes (Signed)
10/02/2022 3:19 PM   [name] [name] 07/09/1951 [id number]  Referring provider: Carrolyn Meiers, MD [place],  [place] [address]  No chief complaint on file.   HPI:  F/u -    1) dysuria - he noted penile pain in Oct 2023 and this was worse in Nov 2023 when a foley was placed and removed around the time of his CABG. The dysuria has resolved but he wonders what caused it. He is on HD - peritoneal. He voids a small amount each morning. No gross hematuria. AUASS = 6.    2) MH - 11-30 rbc on UA - Feb 2024. No further dysuria and no gross hematuria.    3) ED - he underwent removal and replacement of a 3 piece IPP 2019 with Dr. Gloriann Loan.  January 2023 CT of the chest abdomen pelvis was benign.  He had small kidneys.  The reservoir was in the right space of [place]. It cycles normally per pt.    Today, seen for the above. Mar 2024 CT benign. There was high attenuation material in both collecting systems and proximal ureter thought to be from prior contrast administration (not stones). No hydro or mass. He is not having dysuria.    He was a Administrator.    PMH: Past Medical History:  Diagnosis Date   Anemia    Arthritis    Asthma    Bell palsy    CAD (coronary artery disease)    a. 2014 MV: abnl w/ infap ischemia; b. 03/2013 Cath: aneurysmal bleb in the LAD w/ otw nonobs dzs-->Med Rx.   Chronic back pain    Chronic knee pain    a. 09/2015 s/p R TKA.   Chronic pain    Chronic shoulder pain    Chronic sinusitis    COPD (chronic obstructive pulmonary disease) (HCC)    Diabetes mellitus without complication ([place])    type II    ESRD on peritoneal dialysis ([place])    on peritoneal dialysis, [facility]   Essential hypertension    GERD (gastroesophageal reflux disease)    Gout    Gout    Hepatomegaly    noted on noncontrast CT 2015   History of hiatal hernia    Hyperlipidemia    Lateral meniscus tear    Obesity    Truncal   Obstructive sleep  apnea    does not use cpap    On home oxygen therapy    uses 2l when is going somewhere per patient    PUD (peptic ulcer disease)    remote, reports f/u EGD about 8 years ago unremarkable    Reactive airway disease    related to exposure to chemical during 9/11   Sinusitis    Vitamin D deficiency     Surgical History: Past Surgical History:  Procedure Laterality Date   ASAD LT SHOULDER  12/15/2008   left shoulder   AV FISTULA PLACEMENT Left 08/09/2016   Procedure: BRACHIOCEPHALIC ARTERIOVENOUS (AV) FISTULA CREATION LEFT ARM;  Surgeon: [name], MD;  Location: [place];  Service: Vascular;  Laterality: Left;   CAPD INSERTION N/A 10/07/2018   Procedure: LAPAROSCOPIC PERITONEAL CATHETER PLACEMENT;  Surgeon: Mickeal Skinner, MD;  Location: [facility];  Service: General;  Laterality: N/A;   CATARACT EXTRACTION W/PHACO Left 03/28/2016   Procedure: CATARACT EXTRACTION PHACO AND INTRAOCULAR LENS PLACEMENT LEFT EYE;  Surgeon: [name], MD;  Location: [facility];  Service: Ophthalmology;  Laterality: Left;  CDE: 4.77   CATARACT EXTRACTION W/PHACO Right 04/11/2016   Procedure: CATARACT EXTRACTION PHACO AND INTRAOCULAR LENS PLACEMENT RIGHT EYE; CDE:  4.74;  Surgeon: [name], MD;  Location: [facility];  Service: Ophthalmology;  Laterality: Right;   COLONOSCOPY  10/15/2008   Fields: Rectal polyp obliterated, not retrieved, hemorrhoids, single ascending colon diverticulum near the CV. Next colonoscopy April 2020   COLONOSCOPY N/A 12/25/2014   SLF: 1. Colorectal polyps (2) removed 2. Small internal hemorrhoids 3. the left colon is severely redundant. hyperplastic polyps   CORONARY ARTERY BYPASS GRAFT N/A 06/05/2022   Procedure: OFF PUMP CORONARY ARTERY BYPASS GRAFTING (CABG) X 2 BYPASSES USING LEFT INTERNAL MAMMARY ARTERY AND RIGHT LEG GREATER SAPHENOUS VEIN HARVESTED ENDOSCOPICALLY;  Surgeon: Lajuana Matte, MD;  Location: [place];  Service: Open Heart Surgery;  Laterality: N/A;    CORONARY STENT INTERVENTION N/A 07/25/2021   Procedure: CORONARY STENT INTERVENTION;  Surgeon: Sherren Mocha, MD;  Location: [facility];  Service: Cardiovascular;  Laterality: N/A;   CORONARY STENT INTERVENTION N/A 12/26/2021   Procedure: CORONARY STENT INTERVENTION;  Surgeon: Nelva Bush, MD;  Location: [place] [facility];  Service: Cardiovascular;  Laterality: N/A;   CORONARY STENT INTERVENTION N/A 01/20/2022   Procedure: CORONARY STENT INTERVENTION;  Surgeon: Sherren Mocha, MD;  Location: [place] [facility];  Service: Cardiovascular;  Laterality: N/A;   DOPPLER ECHOCARDIOGRAPHY     ESOPHAGOGASTRODUODENOSCOPY N/A 12/25/2014   SLF: 1. Anemia most likely due to CRI, gastritis, gastric polyps 2. Moderate non-erosive gastriits and mild duodenitis.  3.TWo large gstric polyps removed.    EYE SURGERY  12/22/2010   tear duct probing-[place]   FOREIGN BODY REMOVAL  03/29/2011   Procedure: REMOVAL FOREIGN BODY EXTREMITY;  Surgeon: Arther Abbott, MD;  [facility]: [facility];  Service: Orthopedics;  Laterality: Right;  Removal Foreign Body Right Thumb   IR FLUORO GUIDE CV LINE RIGHT  08/06/2018   IR US GUIDE VASC ACCESS RIGHT  08/06/2018   KNEE ARTHROSCOPY  10/16/2007   left   KNEE ARTHROSCOPY WITH LATERAL MENISECTOMY Right 10/14/2015   Procedure: LEFT KNEE ARTHROSCOPY WITH PARTIAL LATERAL MENISECTOMY;  Surgeon: Carole Civil, MD;  [facility];  Service: Orthopedics;  Laterality: Right;   LEFT HEART CATH AND CORONARY ANGIOGRAPHY N/A 07/25/2021   Procedure: LEFT HEART CATH AND CORONARY ANGIOGRAPHY;  Surgeon: Sherren Mocha, MD;  Location: [place] [facility];  Service: Cardiovascular;  Laterality: N/A;   LEFT HEART CATH AND CORONARY ANGIOGRAPHY N/A 12/26/2021   Procedure: LEFT HEART CATH AND CORONARY ANGIOGRAPHY;  Surgeon: Nelva Bush, MD;  Location: [place] [facility];  Service: Cardiovascular;  Laterality: N/A;   LEFT HEART CATH AND CORONARY ANGIOGRAPHY N/A 01/20/2022   Procedure:  LEFT HEART CATH AND CORONARY ANGIOGRAPHY;  Surgeon: Sherren Mocha, MD;  Location: [place] [facility];  Service: Cardiovascular;  Laterality: N/A;   LEFT HEART CATHETERIZATION WITH CORONARY ANGIOGRAM N/A 03/28/2013   Procedure: LEFT HEART CATHETERIZATION WITH CORONARY ANGIOGRAM;  Surgeon: Leonie Man, MD;  Location: [facility];  Service: Cardiovascular;  Laterality: N/A;   NM MYOVIEW LTD     PENILE PROSTHESIS IMPLANT N/A 08/16/2015   Procedure: PENILE PROTHESIS INFLATABLE, three piece, Excisional biopsy of Penile ulcer, Penile molding;  Surgeon: Carolan Clines, MD;  [facility];  Service: Urology;  Laterality: N/A;   PENILE PROSTHESIS IMPLANT N/A 12/24/2017   Procedure: REMOVAL AND  REPLACEMENT  COLOPLAST PENILE PROSTHESIS;  Surgeon: Lucas Mallow, MD;  [facility];  Service: Urology;  Laterality: N/A;   QUADRICEPS TENDON REPAIR  07/21/2011   Procedure: REPAIR QUADRICEP TENDON;  Surgeon: Arther Abbott, MD;  Location: [facility];  Service: Orthopedics;  Laterality: Right;   RIGHT/LEFT HEART CATH AND CORONARY ANGIOGRAPHY N/A 05/30/2022   Procedure: RIGHT/LEFT HEART CATH AND CORONARY ANGIOGRAPHY;  Surgeon: Jettie Booze, MD;  Location: [place] [facility];  Service: Cardiovascular;  Laterality: N/A;   TEE WITHOUT CARDIOVERSION N/A 06/05/2022   Procedure: TRANSESOPHAGEAL ECHOCARDIOGRAM (TEE);  Surgeon: Lajuana Matte, MD;  Location: [place];  Service: Open Heart Surgery;  Laterality: N/A;   TOENAIL EXCISION     removed 0000000   UMBILICAL HERNIA REPAIR  07/17/2005   [place]    Home Medications:  Allergies as of 10/02/2022       Reactions   Opana [oxymorphone Hcl] Itching   Tramadol Itching        Medication List        Accurate as of October 02, 2022  3:19 PM. If you have any questions, ask your nurse or doctor.          albuterol 108 (90 Base) MCG/ACT inhaler Commonly known as: VENTOLIN HFA Inhale 2 puffs into the lungs every 6 (six) hours as  needed for wheezing or shortness of breath.   aspirin EC 81 MG tablet Take 81 mg by mouth daily. Swallow whole.   atorvastatin 80 MG tablet Commonly known as: LIPITOR Take 1 tablet (80 mg total) by mouth daily.   clopidogrel 75 MG tablet Commonly known as: Plavix Take 1 tablet (75 mg total) by mouth daily. Please take first dose at 9 pm on 12/4   Droplet Pen Needles 31G X 5 MM Misc Generic drug: Insulin Pen Needle USE TO INJECT INSULIN DAILY AS DIRECTED   esomeprazole 40 MG capsule Commonly known as: NEXIUM Take 1 capsule (40 mg total) by mouth 2 (two) times daily before a meal.   Lantus SoloStar 100 UNIT/ML Solostar Pen Generic drug: insulin glargine Inject 22 Units into the skin at bedtime.   linagliptin 5 MG Tabs tablet Commonly known as: Tradjenta Take 1 tablet (5 mg total) by mouth daily.   LORazepam 1 MG tablet Commonly known as: Ativan Take 1-2 tablets 30 minutes prior to MRI, may repeat once as needed. Must have driver.   OneTouch Ultra test strip Generic drug: glucose blood USE TO CHECK BLOOD SUGAR TWICE DAILY   sevelamer 800 MG tablet Commonly known as: RENAGEL Take 2 tablets (1,600 mg total) by mouth 3 (three) times daily with meals.   Vitamin D (Ergocalciferol) 1.25 MG (50000 UNIT) Caps capsule Commonly known as: DRISDOL Take 50,000 Units by mouth every 7 (seven) days. Monday        Allergies:  Allergies  Allergen Reactions   Opana [Oxymorphone Hcl] Itching   Tramadol Itching    Family History: Family History  Problem Relation Age of Onset   Hypertension Mother        MI   Cancer Mother        breast    Diabetes Mother    Diabetes Father    Hypertension Father    Hypertension Sister    Diabetes Sister    Arthritis Other    Asthma Other    Lung disease Other    Anesthesia problems Neg Hx    Hypotension Neg Hx    Malignant hyperthermia Neg Hx    Pseudochol deficiency Neg Hx    Colon cancer Neg Hx  Social History:  reports that  he quit smoking about 12 years ago. His smoking use included cigarettes. He has a 25.00 pack-year smoking history. He has never used smokeless tobacco. He reports that he does not currently use alcohol. He reports current drug use. Drug: Marijuana.  Blood pressure 138/81, pulse 76, height 5\' 4"  (1.626 m), weight 227 lb (103 kg). NED. A&Ox3.   No respiratory distress   Abd soft, NT, ND Normal phallus with bilateral descended testicles  Cystoscopy Procedure Note  Patient identification was confirmed, informed consent was obtained, and patient was prepped using Betadine solution.  Lidocaine jelly was administered per urethral meatus.     Pre-Procedure: - Inspection reveals a normal caliber ureteral meatus.  Procedure: The flexible cystoscope was introduced without difficulty - No urethral strictures/lesions are present. - normal prostate, mild bph, no obs  - normal bladder neck - Bilateral ureteral orifices identified - Bladder mucosa  reveals no ulcers, tumors, or lesions - No bladder stones - No trabeculation  Retroflexion shows normal bladder and BN   Post-Procedure: - Patient tolerated the procedure well   Laboratory Data: Lab Results  Component Value Date   WBC 5.2 09/16/2022   HGB 9.7 (L) 09/16/2022   HCT 32.3 (L) 09/16/2022   MCV 92.3 09/16/2022   PLT 128 (L) 09/16/2022    Lab Results  Component Value Date   CREATININE 12.85 (H) 09/16/2022    No results found for: "PSA"  No results found for: "TESTOSTERONE"  Lab Results  Component Value Date   HGBA1C 5.9 (H) 05/30/2022    Urinalysis    Component Value Date/Time   COLORURINE YELLOW 01/15/2014 2320   APPEARANCEUR Clear 08/21/2022 1440   LABSPEC 1.010 01/15/2014 2320   PHURINE 5.5 01/15/2014 2320   GLUCOSEU Negative 08/21/2022 1440   HGBUR TRACE (A) 01/15/2014 2320   BILIRUBINUR Negative 08/21/2022 1440   KETONESUR NEGATIVE 01/15/2014 2320   PROTEINUR 3+ (A) 08/21/2022 1440   PROTEINUR 30 (A)  01/15/2014 2320   UROBILINOGEN 0.2 01/15/2014 2320   NITRITE Negative 08/21/2022 1440   NITRITE NEGATIVE 01/15/2014 2320   LEUKOCYTESUR 1+ (A) 08/21/2022 1440    Lab Results  Component Value Date   LABMICR See below: 08/21/2022   WBCUA >30 (A) 08/21/2022   LABEPIT 0-10 08/21/2022   BACTERIA Moderate (A) 08/21/2022    Pertinent Imaging: CT images - 2024 reviewed   Results for orders placed during the hospital encounter of 10/06/15  US Renal  Narrative CLINICAL DATA:  Chronic renal disease, stage III. Diabetes and hypertension.  EXAM: RENAL / URINARY TRACT ULTRASOUND COMPLETE  COMPARISON:  None.  FINDINGS: Right Kidney:  Length: 10.8 cm. Increased echogenicity without mass or hydronephrosis.  Left Kidney:  Length: 10.8 cm. Increased echogenicity without mass or hydronephrosis.  Bladder:  Appears normal for degree of bladder distention.  IMPRESSION: Increased echogenicity consistent with chronic renal disease. No hydronephrosis or mass. Normal renal size.   Electronically Signed By: Staci Righter M.D. On: 10/06/2015 09:11  No valid procedures specified. No results found for this or any previous visit.  No results found for this or any previous visit.   Assessment & Plan:    1. Microhematuria Benign eval - see back in 1 year or sooner  - Urinalysis, Routine w reflex microscopic - ciprofloxacin (CIPRO) tablet 500 mg   No follow-ups on file.  Festus Aloe, MD  [facility]  [address] [place], [place] [address] [phone]

## 2022-10-03 DIAGNOSIS — Z992 Dependence on renal dialysis: Secondary | ICD-10-CM | POA: Diagnosis not present

## 2022-10-03 DIAGNOSIS — D631 Anemia in chronic kidney disease: Secondary | ICD-10-CM | POA: Diagnosis not present

## 2022-10-03 DIAGNOSIS — N250 Renal osteodystrophy: Secondary | ICD-10-CM | POA: Diagnosis not present

## 2022-10-03 DIAGNOSIS — N186 End stage renal disease: Secondary | ICD-10-CM | POA: Diagnosis not present

## 2022-10-03 DIAGNOSIS — D509 Iron deficiency anemia, unspecified: Secondary | ICD-10-CM | POA: Diagnosis not present

## 2022-10-04 ENCOUNTER — Telehealth: Payer: Self-pay | Admitting: Neurology

## 2022-10-04 ENCOUNTER — Encounter (HOSPITAL_COMMUNITY): Payer: Medicare Other

## 2022-10-04 DIAGNOSIS — D509 Iron deficiency anemia, unspecified: Secondary | ICD-10-CM | POA: Diagnosis not present

## 2022-10-04 DIAGNOSIS — Z992 Dependence on renal dialysis: Secondary | ICD-10-CM | POA: Diagnosis not present

## 2022-10-04 DIAGNOSIS — D631 Anemia in chronic kidney disease: Secondary | ICD-10-CM | POA: Diagnosis not present

## 2022-10-04 DIAGNOSIS — N250 Renal osteodystrophy: Secondary | ICD-10-CM | POA: Diagnosis not present

## 2022-10-04 DIAGNOSIS — N186 End stage renal disease: Secondary | ICD-10-CM | POA: Diagnosis not present

## 2022-10-04 NOTE — Telephone Encounter (Signed)
Called and LVM per DPR 

## 2022-10-04 NOTE — Telephone Encounter (Signed)
Please call patient MRI of the brain from [facility] September 27, 2022, no acute abnormality, mild to moderate paranasal sinus mucosal thickening, left greater than right,  If he has signs of active sinus infection may contact his primary care or ENT

## 2022-10-05 DIAGNOSIS — Z992 Dependence on renal dialysis: Secondary | ICD-10-CM | POA: Diagnosis not present

## 2022-10-05 DIAGNOSIS — N186 End stage renal disease: Secondary | ICD-10-CM | POA: Diagnosis not present

## 2022-10-05 DIAGNOSIS — N250 Renal osteodystrophy: Secondary | ICD-10-CM | POA: Diagnosis not present

## 2022-10-05 DIAGNOSIS — D631 Anemia in chronic kidney disease: Secondary | ICD-10-CM | POA: Diagnosis not present

## 2022-10-05 DIAGNOSIS — D509 Iron deficiency anemia, unspecified: Secondary | ICD-10-CM | POA: Diagnosis not present

## 2022-10-06 ENCOUNTER — Encounter (HOSPITAL_COMMUNITY): Payer: Medicare Other

## 2022-10-06 DIAGNOSIS — Z992 Dependence on renal dialysis: Secondary | ICD-10-CM | POA: Diagnosis not present

## 2022-10-06 DIAGNOSIS — D509 Iron deficiency anemia, unspecified: Secondary | ICD-10-CM | POA: Diagnosis not present

## 2022-10-06 DIAGNOSIS — N250 Renal osteodystrophy: Secondary | ICD-10-CM | POA: Diagnosis not present

## 2022-10-06 DIAGNOSIS — D631 Anemia in chronic kidney disease: Secondary | ICD-10-CM | POA: Diagnosis not present

## 2022-10-06 DIAGNOSIS — N186 End stage renal disease: Secondary | ICD-10-CM | POA: Diagnosis not present

## 2022-10-07 DIAGNOSIS — D509 Iron deficiency anemia, unspecified: Secondary | ICD-10-CM | POA: Diagnosis not present

## 2022-10-07 DIAGNOSIS — D631 Anemia in chronic kidney disease: Secondary | ICD-10-CM | POA: Diagnosis not present

## 2022-10-07 DIAGNOSIS — N186 End stage renal disease: Secondary | ICD-10-CM | POA: Diagnosis not present

## 2022-10-07 DIAGNOSIS — N250 Renal osteodystrophy: Secondary | ICD-10-CM | POA: Diagnosis not present

## 2022-10-07 DIAGNOSIS — Z992 Dependence on renal dialysis: Secondary | ICD-10-CM | POA: Diagnosis not present

## 2022-10-08 DIAGNOSIS — Z992 Dependence on renal dialysis: Secondary | ICD-10-CM | POA: Diagnosis not present

## 2022-10-08 DIAGNOSIS — D631 Anemia in chronic kidney disease: Secondary | ICD-10-CM | POA: Diagnosis not present

## 2022-10-08 DIAGNOSIS — N250 Renal osteodystrophy: Secondary | ICD-10-CM | POA: Diagnosis not present

## 2022-10-08 DIAGNOSIS — N186 End stage renal disease: Secondary | ICD-10-CM | POA: Diagnosis not present

## 2022-10-08 DIAGNOSIS — D509 Iron deficiency anemia, unspecified: Secondary | ICD-10-CM | POA: Diagnosis not present

## 2022-10-09 ENCOUNTER — Encounter (HOSPITAL_COMMUNITY): Payer: Medicare Other

## 2022-10-09 DIAGNOSIS — D631 Anemia in chronic kidney disease: Secondary | ICD-10-CM | POA: Diagnosis not present

## 2022-10-09 DIAGNOSIS — N186 End stage renal disease: Secondary | ICD-10-CM | POA: Diagnosis not present

## 2022-10-09 DIAGNOSIS — N250 Renal osteodystrophy: Secondary | ICD-10-CM | POA: Diagnosis not present

## 2022-10-09 DIAGNOSIS — D509 Iron deficiency anemia, unspecified: Secondary | ICD-10-CM | POA: Diagnosis not present

## 2022-10-09 DIAGNOSIS — Z992 Dependence on renal dialysis: Secondary | ICD-10-CM | POA: Diagnosis not present

## 2022-10-09 NOTE — Addendum Note (Signed)
Encounter addended by: Philis Kendall on: 10/09/2022 11:17 AM  Actions taken: Flowsheet data copied forward, Flowsheet accepted

## 2022-10-10 DIAGNOSIS — Z992 Dependence on renal dialysis: Secondary | ICD-10-CM | POA: Diagnosis not present

## 2022-10-10 DIAGNOSIS — D631 Anemia in chronic kidney disease: Secondary | ICD-10-CM | POA: Diagnosis not present

## 2022-10-10 DIAGNOSIS — E1165 Type 2 diabetes mellitus with hyperglycemia: Secondary | ICD-10-CM | POA: Diagnosis not present

## 2022-10-10 DIAGNOSIS — I10 Essential (primary) hypertension: Secondary | ICD-10-CM | POA: Diagnosis not present

## 2022-10-10 DIAGNOSIS — N250 Renal osteodystrophy: Secondary | ICD-10-CM | POA: Diagnosis not present

## 2022-10-10 DIAGNOSIS — D509 Iron deficiency anemia, unspecified: Secondary | ICD-10-CM | POA: Diagnosis not present

## 2022-10-10 DIAGNOSIS — N186 End stage renal disease: Secondary | ICD-10-CM | POA: Diagnosis not present

## 2022-10-10 DIAGNOSIS — J3089 Other allergic rhinitis: Secondary | ICD-10-CM | POA: Diagnosis not present

## 2022-10-11 ENCOUNTER — Encounter (HOSPITAL_COMMUNITY): Payer: Medicare Other

## 2022-10-11 DIAGNOSIS — N250 Renal osteodystrophy: Secondary | ICD-10-CM | POA: Diagnosis not present

## 2022-10-11 DIAGNOSIS — N186 End stage renal disease: Secondary | ICD-10-CM | POA: Diagnosis not present

## 2022-10-11 DIAGNOSIS — D631 Anemia in chronic kidney disease: Secondary | ICD-10-CM | POA: Diagnosis not present

## 2022-10-11 DIAGNOSIS — Z992 Dependence on renal dialysis: Secondary | ICD-10-CM | POA: Diagnosis not present

## 2022-10-11 DIAGNOSIS — D509 Iron deficiency anemia, unspecified: Secondary | ICD-10-CM | POA: Diagnosis not present

## 2022-10-11 NOTE — Addendum Note (Signed)
Encounter addended by: Dwana Melena, RN on: 10/11/2022 8:14 AM  Actions taken: Clinical Note Signed
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
## 2022-10-12 ENCOUNTER — Encounter: Payer: Self-pay | Admitting: Orthopedic Surgery

## 2022-10-12 ENCOUNTER — Ambulatory Visit (INDEPENDENT_AMBULATORY_CARE_PROVIDER_SITE_OTHER): Payer: Medicare Other | Admitting: Orthopedic Surgery

## 2022-10-12 ENCOUNTER — Other Ambulatory Visit (INDEPENDENT_AMBULATORY_CARE_PROVIDER_SITE_OTHER): Payer: Medicare Other

## 2022-10-12 VITALS — BP 134/89 | HR 67 | Ht 64.0 in | Wt 217.0 lb

## 2022-10-12 DIAGNOSIS — S8001XA Contusion of right knee, initial encounter: Secondary | ICD-10-CM | POA: Diagnosis not present

## 2022-10-12 DIAGNOSIS — M542 Cervicalgia: Secondary | ICD-10-CM

## 2022-10-12 DIAGNOSIS — S134XXA Sprain of ligaments of cervical spine, initial encounter: Secondary | ICD-10-CM | POA: Diagnosis not present

## 2022-10-12 DIAGNOSIS — M545 Low back pain, unspecified: Secondary | ICD-10-CM | POA: Diagnosis not present

## 2022-10-12 DIAGNOSIS — S8002XA Contusion of left knee, initial encounter: Secondary | ICD-10-CM

## 2022-10-12 DIAGNOSIS — Z992 Dependence on renal dialysis: Secondary | ICD-10-CM | POA: Diagnosis not present

## 2022-10-12 DIAGNOSIS — D631 Anemia in chronic kidney disease: Secondary | ICD-10-CM | POA: Diagnosis not present

## 2022-10-12 DIAGNOSIS — D509 Iron deficiency anemia, unspecified: Secondary | ICD-10-CM | POA: Diagnosis not present

## 2022-10-12 DIAGNOSIS — N186 End stage renal disease: Secondary | ICD-10-CM | POA: Diagnosis not present

## 2022-10-12 DIAGNOSIS — N250 Renal osteodystrophy: Secondary | ICD-10-CM | POA: Diagnosis not present

## 2022-10-12 MED ORDER — METHOCARBAMOL 750 MG PO TABS: 750.0000 mg | ORAL_TABLET | Freq: Four times a day (QID) | ORAL | 2 refills | Status: DC
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
## 2022-10-13 ENCOUNTER — Encounter (HOSPITAL_COMMUNITY): Payer: Medicare Other

## 2022-10-13 DIAGNOSIS — N186 End stage renal disease: Secondary | ICD-10-CM | POA: Diagnosis not present

## 2022-10-13 DIAGNOSIS — D631 Anemia in chronic kidney disease: Secondary | ICD-10-CM | POA: Diagnosis not present

## 2022-10-13 DIAGNOSIS — Z992 Dependence on renal dialysis: Secondary | ICD-10-CM | POA: Diagnosis not present

## 2022-10-13 DIAGNOSIS — D509 Iron deficiency anemia, unspecified: Secondary | ICD-10-CM | POA: Diagnosis not present

## 2022-10-13 DIAGNOSIS — N250 Renal osteodystrophy: Secondary | ICD-10-CM | POA: Diagnosis not present

## 2022-10-14 DIAGNOSIS — N250 Renal osteodystrophy: Secondary | ICD-10-CM | POA: Diagnosis not present

## 2022-10-14 DIAGNOSIS — Z992 Dependence on renal dialysis: Secondary | ICD-10-CM | POA: Diagnosis not present

## 2022-10-14 DIAGNOSIS — D509 Iron deficiency anemia, unspecified: Secondary | ICD-10-CM | POA: Diagnosis not present

## 2022-10-14 DIAGNOSIS — D631 Anemia in chronic kidney disease: Secondary | ICD-10-CM | POA: Diagnosis not present

## 2022-10-14 DIAGNOSIS — N186 End stage renal disease: Secondary | ICD-10-CM | POA: Diagnosis not present

## 2022-10-15 DIAGNOSIS — D509 Iron deficiency anemia, unspecified: Secondary | ICD-10-CM | POA: Diagnosis not present

## 2022-10-15 DIAGNOSIS — D631 Anemia in chronic kidney disease: Secondary | ICD-10-CM | POA: Diagnosis not present

## 2022-10-15 DIAGNOSIS — N186 End stage renal disease: Secondary | ICD-10-CM | POA: Diagnosis not present

## 2022-10-15 DIAGNOSIS — Z992 Dependence on renal dialysis: Secondary | ICD-10-CM | POA: Diagnosis not present

## 2022-10-15 DIAGNOSIS — N250 Renal osteodystrophy: Secondary | ICD-10-CM | POA: Diagnosis not present

## 2022-10-16 ENCOUNTER — Encounter (HOSPITAL_COMMUNITY): Payer: Medicare Other

## 2022-10-16 DIAGNOSIS — N250 Renal osteodystrophy: Secondary | ICD-10-CM | POA: Diagnosis not present

## 2022-10-16 DIAGNOSIS — Z992 Dependence on renal dialysis: Secondary | ICD-10-CM | POA: Diagnosis not present

## 2022-10-16 DIAGNOSIS — D509 Iron deficiency anemia, unspecified: Secondary | ICD-10-CM | POA: Diagnosis not present

## 2022-10-16 DIAGNOSIS — D631 Anemia in chronic kidney disease: Secondary | ICD-10-CM | POA: Diagnosis not present

## 2022-10-16 DIAGNOSIS — N186 End stage renal disease: Secondary | ICD-10-CM | POA: Diagnosis not present

## 2022-10-17 ENCOUNTER — Other Ambulatory Visit: Payer: Self-pay | Admitting: Student in an Organized Health Care Education/Training Program

## 2022-10-17 DIAGNOSIS — Z992 Dependence on renal dialysis: Secondary | ICD-10-CM | POA: Diagnosis not present

## 2022-10-17 DIAGNOSIS — D509 Iron deficiency anemia, unspecified: Secondary | ICD-10-CM | POA: Diagnosis not present

## 2022-10-17 DIAGNOSIS — D631 Anemia in chronic kidney disease: Secondary | ICD-10-CM | POA: Diagnosis not present

## 2022-10-17 DIAGNOSIS — N186 End stage renal disease: Secondary | ICD-10-CM | POA: Diagnosis not present

## 2022-10-17 DIAGNOSIS — N250 Renal osteodystrophy: Secondary | ICD-10-CM | POA: Diagnosis not present

## 2022-10-17 NOTE — Addendum Note (Signed)
Encounter addended by: Philis Kendall on: 10/17/2022 11:25 AM  Actions taken: Flowsheet accepted

## 2022-10-18 ENCOUNTER — Encounter (HOSPITAL_COMMUNITY): Admission: RE | Admit: 2022-10-18 | Payer: Medicare Other | Source: Ambulatory Visit

## 2022-10-18 DIAGNOSIS — Z992 Dependence on renal dialysis: Secondary | ICD-10-CM | POA: Diagnosis not present

## 2022-10-18 DIAGNOSIS — N250 Renal osteodystrophy: Secondary | ICD-10-CM | POA: Diagnosis not present

## 2022-10-18 DIAGNOSIS — D509 Iron deficiency anemia, unspecified: Secondary | ICD-10-CM | POA: Diagnosis not present

## 2022-10-18 DIAGNOSIS — N186 End stage renal disease: Secondary | ICD-10-CM | POA: Diagnosis not present

## 2022-10-18 DIAGNOSIS — D631 Anemia in chronic kidney disease: Secondary | ICD-10-CM | POA: Diagnosis not present

## 2022-10-19 DIAGNOSIS — Z794 Long term (current) use of insulin: Secondary | ICD-10-CM | POA: Diagnosis not present

## 2022-10-19 DIAGNOSIS — N186 End stage renal disease: Secondary | ICD-10-CM | POA: Diagnosis not present

## 2022-10-19 DIAGNOSIS — D631 Anemia in chronic kidney disease: Secondary | ICD-10-CM | POA: Diagnosis not present

## 2022-10-19 DIAGNOSIS — N250 Renal osteodystrophy: Secondary | ICD-10-CM | POA: Diagnosis not present

## 2022-10-19 DIAGNOSIS — D509 Iron deficiency anemia, unspecified: Secondary | ICD-10-CM | POA: Diagnosis not present

## 2022-10-19 DIAGNOSIS — E119 Type 2 diabetes mellitus without complications: Secondary | ICD-10-CM | POA: Diagnosis not present

## 2022-10-19 DIAGNOSIS — Z992 Dependence on renal dialysis: Secondary | ICD-10-CM | POA: Diagnosis not present

## 2022-10-20 ENCOUNTER — Encounter (HOSPITAL_COMMUNITY): Payer: Medicare Other

## 2022-10-20 DIAGNOSIS — N250 Renal osteodystrophy: Secondary | ICD-10-CM | POA: Diagnosis not present

## 2022-10-20 DIAGNOSIS — Z992 Dependence on renal dialysis: Secondary | ICD-10-CM | POA: Diagnosis not present

## 2022-10-20 DIAGNOSIS — D509 Iron deficiency anemia, unspecified: Secondary | ICD-10-CM | POA: Diagnosis not present

## 2022-10-20 DIAGNOSIS — N186 End stage renal disease: Secondary | ICD-10-CM | POA: Diagnosis not present

## 2022-10-20 DIAGNOSIS — D631 Anemia in chronic kidney disease: Secondary | ICD-10-CM | POA: Diagnosis not present

## 2022-10-21 DIAGNOSIS — D631 Anemia in chronic kidney disease: Secondary | ICD-10-CM | POA: Diagnosis not present

## 2022-10-21 DIAGNOSIS — N250 Renal osteodystrophy: Secondary | ICD-10-CM | POA: Diagnosis not present

## 2022-10-21 DIAGNOSIS — N186 End stage renal disease: Secondary | ICD-10-CM | POA: Diagnosis not present

## 2022-10-21 DIAGNOSIS — D509 Iron deficiency anemia, unspecified: Secondary | ICD-10-CM | POA: Diagnosis not present

## 2022-10-21 DIAGNOSIS — Z992 Dependence on renal dialysis: Secondary | ICD-10-CM | POA: Diagnosis not present

## 2022-10-22 DIAGNOSIS — Z992 Dependence on renal dialysis: Secondary | ICD-10-CM | POA: Diagnosis not present

## 2022-10-22 DIAGNOSIS — N186 End stage renal disease: Secondary | ICD-10-CM | POA: Diagnosis not present

## 2022-10-22 DIAGNOSIS — D631 Anemia in chronic kidney disease: Secondary | ICD-10-CM | POA: Diagnosis not present

## 2022-10-22 DIAGNOSIS — N250 Renal osteodystrophy: Secondary | ICD-10-CM | POA: Diagnosis not present

## 2022-10-22 DIAGNOSIS — D509 Iron deficiency anemia, unspecified: Secondary | ICD-10-CM | POA: Diagnosis not present

## 2022-10-23 ENCOUNTER — Encounter (HOSPITAL_COMMUNITY): Payer: Medicare Other

## 2022-10-23 DIAGNOSIS — N250 Renal osteodystrophy: Secondary | ICD-10-CM | POA: Diagnosis not present

## 2022-10-23 DIAGNOSIS — D631 Anemia in chronic kidney disease: Secondary | ICD-10-CM | POA: Diagnosis not present

## 2022-10-23 DIAGNOSIS — N186 End stage renal disease: Secondary | ICD-10-CM | POA: Diagnosis not present

## 2022-10-23 DIAGNOSIS — Z992 Dependence on renal dialysis: Secondary | ICD-10-CM | POA: Diagnosis not present

## 2022-10-23 DIAGNOSIS — D509 Iron deficiency anemia, unspecified: Secondary | ICD-10-CM | POA: Diagnosis not present

## 2022-10-24 DIAGNOSIS — D631 Anemia in chronic kidney disease: Secondary | ICD-10-CM | POA: Diagnosis not present

## 2022-10-24 DIAGNOSIS — N186 End stage renal disease: Secondary | ICD-10-CM | POA: Diagnosis not present

## 2022-10-24 DIAGNOSIS — N250 Renal osteodystrophy: Secondary | ICD-10-CM | POA: Diagnosis not present

## 2022-10-24 DIAGNOSIS — Z992 Dependence on renal dialysis: Secondary | ICD-10-CM | POA: Diagnosis not present

## 2022-10-24 DIAGNOSIS — D509 Iron deficiency anemia, unspecified: Secondary | ICD-10-CM | POA: Diagnosis not present
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
## 2022-10-24 NOTE — Addendum Note (Signed)
Encounter addended by: Alisia Ferrari on: 10/24/2022 9:18 AM  Actions taken: Flowsheet accepted, Clinical Note Signed

## 2022-10-25 ENCOUNTER — Encounter (HOSPITAL_COMMUNITY): Payer: Medicare Other

## 2022-10-25 DIAGNOSIS — N186 End stage renal disease: Secondary | ICD-10-CM | POA: Diagnosis not present

## 2022-10-25 DIAGNOSIS — D509 Iron deficiency anemia, unspecified: Secondary | ICD-10-CM | POA: Diagnosis not present

## 2022-10-25 DIAGNOSIS — N250 Renal osteodystrophy: Secondary | ICD-10-CM | POA: Diagnosis not present

## 2022-10-25 DIAGNOSIS — D631 Anemia in chronic kidney disease: Secondary | ICD-10-CM | POA: Diagnosis not present

## 2022-10-25 DIAGNOSIS — Z992 Dependence on renal dialysis: Secondary | ICD-10-CM | POA: Diagnosis not present

## 2022-10-26 ENCOUNTER — Encounter: Payer: Self-pay | Admitting: Gastroenterology

## 2022-10-26 ENCOUNTER — Ambulatory Visit (INDEPENDENT_AMBULATORY_CARE_PROVIDER_SITE_OTHER): Payer: Medicare Other | Admitting: Gastroenterology

## 2022-10-26 VITALS — BP 155/83 | HR 101 | Temp 98.7°F | Ht 64.0 in | Wt 219.8 lb

## 2022-10-26 DIAGNOSIS — K219 Gastro-esophageal reflux disease without esophagitis: Secondary | ICD-10-CM | POA: Diagnosis not present

## 2022-10-26 DIAGNOSIS — D631 Anemia in chronic kidney disease: Secondary | ICD-10-CM | POA: Diagnosis not present

## 2022-10-26 DIAGNOSIS — D509 Iron deficiency anemia, unspecified: Secondary | ICD-10-CM | POA: Diagnosis not present

## 2022-10-26 DIAGNOSIS — Z992 Dependence on renal dialysis: Secondary | ICD-10-CM | POA: Diagnosis not present

## 2022-10-26 DIAGNOSIS — N186 End stage renal disease: Secondary | ICD-10-CM | POA: Diagnosis not present

## 2022-10-26 DIAGNOSIS — N250 Renal osteodystrophy: Secondary | ICD-10-CM | POA: Diagnosis not present

## 2022-10-26 NOTE — Progress Notes (Signed)
Gastroenterology Office Note     Primary Care Physician:  Benetta Spar, MD  Primary Gastroenterologist: Dr. Marletta Lor   Chief Complaint   Chief Complaint  Patient presents with   Gastroesophageal Reflux    Follow up on GERD. Takes esomeprazole bid and states he is oing well with it.      History of Present Illness   [name] [name] is a 72 y.o. male presenting today in follow-up with a history of GERD, anemia of likely chronic disease, and hemorrhoids.  Last colonoscopy 2016 with hyperplastic polyps, here for routine follow-up.  Last seen in Dec 2023. Due to breakthrough GERD, he was changed from pantoprazole to Nexium BID. He is doing well currently with resolution of symptoms.   Nexium BID. No H2 blockers. No abdominal pain, N/V, changes in bowel habits, constipation, diarrhea, overt GI bleeding, GERD, dysphagia, unexplained weight loss, lack of appetite, unexplained weight gain.   Doing well today.    Past Medical History:  Diagnosis Date   Anemia    Arthritis    Asthma    Bell palsy    CAD (coronary artery disease)    a. 2014 MV: abnl w/ infap ischemia; b. 03/2013 Cath: aneurysmal bleb in the LAD w/ otw nonobs dzs-->Med Rx.   Chronic back pain    Chronic knee pain    a. 09/2015 s/p R TKA.   Chronic pain    Chronic shoulder pain    Chronic sinusitis    COPD (chronic obstructive pulmonary disease)    Diabetes mellitus without complication    type II    ESRD on peritoneal dialysis    on peritoneal dialysis, [facility]   Essential hypertension    GERD (gastroesophageal reflux disease)    Gout    Gout    Hepatomegaly    noted on noncontrast CT 2015   History of hiatal hernia    Hyperlipidemia    Lateral meniscus tear    Obesity    Truncal   Obstructive sleep apnea    does not use cpap    On home oxygen therapy    uses 2l when is going somewhere per patient    PUD (peptic ulcer disease)    remote, reports f/u EGD about 8 years ago  unremarkable    Reactive airway disease    related to exposure to chemical during 9/11   Sinusitis    Vitamin D deficiency     Past Surgical History:  Procedure Laterality Date   ASAD LT SHOULDER  12/15/2008   left shoulder   AV FISTULA PLACEMENT Left 08/09/2016   Procedure: BRACHIOCEPHALIC ARTERIOVENOUS (AV) FISTULA CREATION LEFT ARM;  Surgeon: Sherren Kerns, MD;  Location: [facility] OR;  Service: Vascular;  Laterality: Left;   CAPD INSERTION N/A 10/07/2018   Procedure: LAPAROSCOPIC PERITONEAL CATHETER PLACEMENT;  Surgeon: Rodman Pickle, MD;  Location: [facility] ORS;  Service: General;  Laterality: N/A;   CATARACT EXTRACTION W/PHACO Left 03/28/2016   Procedure: CATARACT EXTRACTION PHACO AND INTRAOCULAR LENS PLACEMENT LEFT EYE;  Surgeon: Jethro Bolus, MD;  Location: [facility] ORS;  Service: Ophthalmology;  Laterality: Left;  CDE: 4.77   CATARACT EXTRACTION W/PHACO Right 04/11/2016   Procedure: CATARACT EXTRACTION PHACO AND INTRAOCULAR LENS PLACEMENT RIGHT EYE; CDE:  4.74;  Surgeon: Jethro Bolus, MD;  Location: [facility] ORS;  Service: Ophthalmology;  Laterality: Right;   COLONOSCOPY  10/15/2008   Fields: Rectal polyp obliterated, not retrieved, hemorrhoids, single ascending colon diverticulum near the CV. Next colonoscopy  April 2020   COLONOSCOPY N/A 12/25/2014   SLF: 1. Colorectal polyps (2) removed 2. Small internal hemorrhoids 3. the left colon is severely redundant. hyperplastic polyps   CORONARY ARTERY BYPASS GRAFT N/A 06/05/2022   Procedure: OFF PUMP CORONARY ARTERY BYPASS GRAFTING (CABG) X 2 BYPASSES USING LEFT INTERNAL MAMMARY ARTERY AND RIGHT LEG GREATER SAPHENOUS VEIN HARVESTED ENDOSCOPICALLY;  Surgeon: Corliss Skains, MD;  Location: [facility] OR;  Service: Open Heart Surgery;  Laterality: N/A;   CORONARY STENT INTERVENTION N/A 07/25/2021   Procedure: CORONARY STENT INTERVENTION;  Surgeon: Tonny Bollman, MD;  Location: [facility] INVASIVE CV LAB;  Service: Cardiovascular;  Laterality: N/A;   CORONARY  STENT INTERVENTION N/A 12/26/2021   Procedure: CORONARY STENT INTERVENTION;  Surgeon: Yvonne Kendall, MD;  Location: [facility] INVASIVE CV LAB;  Service: Cardiovascular;  Laterality: N/A;   CORONARY STENT INTERVENTION N/A 01/20/2022   Procedure: CORONARY STENT INTERVENTION;  Surgeon: Tonny Bollman, MD;  Location: [facility] INVASIVE CV LAB;  Service: Cardiovascular;  Laterality: N/A;   DOPPLER ECHOCARDIOGRAPHY     ESOPHAGOGASTRODUODENOSCOPY N/A 12/25/2014   SLF: 1. Anemia most likely due to CRI, gastritis, gastric polyps 2. Moderate non-erosive gastriits and mild duodenitis.  3.TWo large gstric polyps removed.    EYE SURGERY  12/22/2010   tear duct probing-[place]   FOREIGN BODY REMOVAL  03/29/2011   Procedure: REMOVAL FOREIGN BODY EXTREMITY;  Surgeon: Fuller Canada, MD;  Location: [facility];  Service: Orthopedics;  Laterality: Right;  Removal Foreign Body Right Thumb   IR FLUORO GUIDE CV LINE RIGHT  08/06/2018   IR US GUIDE VASC ACCESS RIGHT  08/06/2018   KNEE ARTHROSCOPY  10/16/2007   left   KNEE ARTHROSCOPY WITH LATERAL MENISECTOMY Right 10/14/2015   Procedure: LEFT KNEE ARTHROSCOPY WITH PARTIAL LATERAL MENISECTOMY;  Surgeon: Vickki Hearing, MD;  Location: [facility];  Service: Orthopedics;  Laterality: Right;   LEFT HEART CATH AND CORONARY ANGIOGRAPHY N/A 07/25/2021   Procedure: LEFT HEART CATH AND CORONARY ANGIOGRAPHY;  Surgeon: Tonny Bollman, MD;  Location: [facility] INVASIVE CV LAB;  Service: Cardiovascular;  Laterality: N/A;   LEFT HEART CATH AND CORONARY ANGIOGRAPHY N/A 12/26/2021   Procedure: LEFT HEART CATH AND CORONARY ANGIOGRAPHY;  Surgeon: Yvonne Kendall, MD;  Location: [facility] INVASIVE CV LAB;  Service: Cardiovascular;  Laterality: N/A;   LEFT HEART CATH AND CORONARY ANGIOGRAPHY N/A 01/20/2022   Procedure: LEFT HEART CATH AND CORONARY ANGIOGRAPHY;  Surgeon: Tonny Bollman, MD;  Location: [facility] INVASIVE CV LAB;  Service: Cardiovascular;  Laterality: N/A;   LEFT HEART CATHETERIZATION WITH CORONARY ANGIOGRAM  N/A 03/28/2013   Procedure: LEFT HEART CATHETERIZATION WITH CORONARY ANGIOGRAM;  Surgeon: Marykay Lex, MD;  Location: [facility] CATH LAB;  Service: Cardiovascular;  Laterality: N/A;   NM MYOVIEW LTD     PENILE PROSTHESIS IMPLANT N/A 08/16/2015   Procedure: PENILE PROTHESIS INFLATABLE, three piece, Excisional biopsy of Penile ulcer, Penile molding;  Surgeon: Jethro Bolus, MD;  Location: [facility] ORS;  Service: Urology;  Laterality: N/A;   PENILE PROSTHESIS IMPLANT N/A 12/24/2017   Procedure: REMOVAL AND  REPLACEMENT  COLOPLAST PENILE PROSTHESIS;  Surgeon: Crista Elliot, MD;  Location: [facility] ORS;  Service: Urology;  Laterality: N/A;   QUADRICEPS TENDON REPAIR  07/21/2011   Procedure: REPAIR QUADRICEP TENDON;  Surgeon: Fuller Canada, MD;  Location: [facility];  Service: Orthopedics;  Laterality: Right;   RIGHT/LEFT HEART CATH AND CORONARY ANGIOGRAPHY N/A 05/30/2022   Procedure: RIGHT/LEFT HEART CATH AND CORONARY ANGIOGRAPHY;  Surgeon: Corky Crafts, MD;  Location: [facility] INVASIVE  CV LAB;  Service: Cardiovascular;  Laterality: N/A;   TEE WITHOUT CARDIOVERSION N/A 06/05/2022   Procedure: TRANSESOPHAGEAL ECHOCARDIOGRAM (TEE);  Surgeon: Corliss SkainsLightfoot, Harrell O, MD;  [facility];  Service: Open Heart Surgery;  Laterality: N/A;   TOENAIL EXCISION     removed x2-bilateral   UMBILICAL HERNIA REPAIR  07/17/2005   [place]    Current Outpatient Medications  Medication Sig Dispense Refill   albuterol (VENTOLIN HFA) 108 (90 Base) MCG/ACT inhaler Inhale 2 puffs into the lungs every 6 (six) hours as needed for wheezing or shortness of breath. 18 g 11   aspirin EC 81 MG tablet Take 81 mg by mouth daily. Swallow whole.     atorvastatin (LIPITOR) 80 MG tablet Take 1 tablet (80 mg total) by mouth daily. 30 tablet 1   budesonide-formoterol (SYMBICORT) 160-4.5 MCG/ACT inhaler Symbicort 160 mcg-4.5 mcg/actuation HFA aerosol inhaler     clopidogrel (PLAVIX) 75 MG tablet Take 1 tablet (75 mg total) by mouth daily.  Please take first dose at 9 pm on 12/4 30 tablet 1   DROPLET PEN NEEDLES 31G X 5 MM MISC USE TO INJECT INSULIN DAILY AS DIRECTED 100 each 1   esomeprazole (NEXIUM) 40 MG capsule Take 1 capsule (40 mg total) by mouth 2 (two) times daily before a meal. 180 capsule 1   fluticasone (FLONASE) 50 MCG/ACT nasal spray Place 1 spray into both nostrils daily.     gabapentin (NEURONTIN) 100 MG capsule Take 100 mg by mouth 3 (three) times daily.     insulin glargine (LANTUS SOLOSTAR) 100 UNIT/ML Solostar Pen Inject 22 Units into the skin at bedtime. 18 mL 3   linagliptin (TRADJENTA) 5 MG TABS tablet Take 1 tablet (5 mg total) by mouth daily. 90 tablet 3   methocarbamol (ROBAXIN) 750 MG tablet Take 1 tablet (750 mg total) by mouth 4 (four) times daily. 60 tablet 2   midodrine (PROAMATINE) 5 MG tablet Take 5 mg by mouth 3 (three) times daily with meals.     ONETOUCH ULTRA test strip USE TO CHECK BLOOD SUGAR TWICE DAILY 200 strip 2   sevelamer (RENAGEL) 800 MG tablet Take 2 tablets (1,600 mg total) by mouth 3 (three) times daily with meals. 180 tablet 1   Vitamin D, Ergocalciferol, (DRISDOL) 1.25 MG (50000 UNIT) CAPS capsule Take 50,000 Units by mouth every 7 (seven) days. Monday     predniSONE (DELTASONE) 20 MG tablet Take 20 mg by mouth daily. (Patient not taking: Reported on 10/26/2022)     No current facility-administered medications for this visit.    Allergies as of 10/26/2022 - Review Complete 10/26/2022  Allergen Reaction Noted   Opana [oxymorphone hcl] Itching 01/15/2014   Tramadol Itching 07/10/2011    Family History  Problem Relation Age of Onset   Hypertension Mother        MI   Cancer Mother        breast    Diabetes Mother    Diabetes Father    Hypertension Father    Hypertension Sister    Diabetes Sister    Arthritis Other    Asthma Other    Lung disease Other    Anesthesia problems Neg Hx    Hypotension Neg Hx    Malignant hyperthermia Neg Hx    Pseudochol deficiency Neg Hx     Colon cancer Neg Hx     Social History   Socioeconomic History   Marital status: Married    Spouse name: Not on file  Number of children: 2   Years of education: 12th grade   Highest education level: Not on file  Occupational History   Occupation: disabled   Occupation:      Associate Professor: UNEMPLOYED  Tobacco Use   Smoking status: Former    Packs/day: 1.00    Years: 25.00    Additional pack years: 0.00    Total pack years: 25.00    Types: Cigarettes    Quit date: 03/27/2010    Years since quitting: 12.5   Smokeless tobacco: Never   Tobacco comments:    Quit x 7 years  Vaping Use   Vaping Use: Never used  Substance and Sexual Activity   Alcohol use: Not Currently    Comment: occasionally   Drug use: Yes    Types: Marijuana    Comment: cocaine- last time used- 11/24/2017 , marijuana-    Sexual activity: Yes  Other Topics Concern   Not on file  Social History Narrative   He quit smoking in 2010. 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 He developed pulmonary problems, became disabled because of lower airway disease in 2009.       WATCHES BASKETBALL. HIS TEAM IS [place].   Social Determinants of Health   Financial Resource Strain: Low Risk  (12/29/2021)   Overall Financial Resource Strain (CARDIA)    Difficulty of Paying Living Expenses: Not hard at all  Food Insecurity: No Food Insecurity (06/07/2022)   Hunger Vital Sign    Worried About Running Out of Food in the Last Year: Never true    Ran Out of Food in the Last Year: Never true  Transportation Needs: No Transportation Needs (06/07/2022)   PRAPARE - Administrator, Civil Service (Medical): No    Lack of Transportation (Non-Medical): No  Physical Activity: Sufficiently Active (12/29/2021)   Exercise Vital Sign    Days of Exercise per Week: 3 days    Minutes of Exercise per Session: 60 min  Stress: No Stress Concern Present (12/29/2021)   [employer] of  Occupational Health - Occupational Stress Questionnaire    Feeling of Stress : Not at all  Social Connections: Socially Integrated (12/29/2021)   Social Connection and Isolation Panel [NHANES]    Frequency of Communication with Friends and Family: More than three times a week    Frequency of Social Gatherings with Friends and Family: More than three times a week    Attends Religious Services: More than 4 times per year    Active Member of [employer] or Organizations: Yes    Attends Engineer, structural: More than 4 times per year    Marital Status: Married  Catering manager Violence: Not At Risk (06/07/2022)   Humiliation, Afraid, Rape, and Kick questionnaire    Fear of Current or Ex-Partner: No    Emotionally Abused: No    Physically Abused: No    Sexually Abused: No     Review of Systems   Gen: Denies any fever, chills, fatigue, weight loss, lack of appetite.  CV: Denies chest pain, heart palpitations, peripheral edema, syncope.  Resp: Denies shortness of breath at rest or with exertion. Denies wheezing or cough.  GI: Denies dysphagia or odynophagia. Denies jaundice, hematemesis, fecal incontinence. GU : Denies urinary burning, urinary frequency, urinary hesitancy MS: Denies joint pain, muscle weakness, cramps, or limitation of movement.  Derm: Denies rash, itching, dry skin Psych: Denies depression, anxiety, memory loss, and confusion Heme: Denies  bruising, bleeding, and enlarged lymph nodes.   Physical Exam   BP (!) 155/83 Comment: has not take bp med today  Pulse (!) 101   Temp 98.7 F (37.1 C) (Oral)   Ht 5\' 4"  (1.626 m)   Wt 219 lb 12.8 oz (99.7 kg)   BMI 37.73 kg/m  General:   Alert and oriented. Pleasant and cooperative. Well-nourished and well-developed.  Head:  Normocephalic and atraumatic. Eyes:  Without icterus Abdomen:  +BS, soft, non-tender and non-distended. No HSM noted. No guarding or rebound. No masses appreciated. PD catheter in place.  Rectal:   Deferred  Msk:  Symmetrical without gross deformities. Normal posture. Extremities:  Without edema. Neurologic:  Alert and  oriented x4;  grossly normal neurologically. Skin:  Intact without significant lesions or rashes. Psych:  Alert and cooperative. Normal mood and affect.  Lab Results  Component Value Date   WBC 5.2 09/16/2022   HGB 9.7 (L) 09/16/2022   HCT 32.3 (L) 09/16/2022   MCV 92.3 09/16/2022   PLT 128 (L) 09/16/2022   Lab Results  Component Value Date   IRON 79 06/02/2022   TIBC 293 06/02/2022   FERRITIN 1,105 (H) 06/02/2022     Assessment   [name] [name] is a 72 y.o. male presenting today in follow-up with a history GERD, anemia of likely chronic disease, and hemorrhoids.    GERD: doing well on Nexium BID. I have asked him to decrease this to once daily so he may be on lowest effective dose. If needed, may increase to BID prn.   Hyperplastic polyps: due for colonoscopy in 2026. No concerning lower GI signs/symptoms.   Anemia of chronic disease: no overt GI bleeding.    PLAN    Follow-up in 1 year Decrease Nexium to once daily. May take BID if needed Call if any concerns   Gelene Mink, PhD, ANP-BC [facility] Gastroenterology

## 2022-10-26 NOTE — Patient Instructions (Signed)
You can decrease Nexium to once daily, 30 minutes before breakfast. If needed, you can take up to twice a day.  As you are doing well, we will see you back in 1 year!  Please call with any concerns in the meantime!  I enjoyed seeing you again today! At our first visit, I mentioned how I value our relationship and want to provide genuine, compassionate, and quality care. You may receive a survey regarding your visit with me, and I welcome your feedback! Thanks so much for taking the time to complete this. I look forward to seeing you again.   Gelene Mink, PhD, ANP-BC [facility] Gastroenterology

## 2022-10-27 ENCOUNTER — Encounter (HOSPITAL_COMMUNITY): Payer: Medicare Other

## 2022-10-27 DIAGNOSIS — D631 Anemia in chronic kidney disease: Secondary | ICD-10-CM | POA: Diagnosis not present

## 2022-10-27 DIAGNOSIS — N186 End stage renal disease: Secondary | ICD-10-CM | POA: Diagnosis not present

## 2022-10-27 DIAGNOSIS — D509 Iron deficiency anemia, unspecified: Secondary | ICD-10-CM | POA: Diagnosis not present

## 2022-10-27 DIAGNOSIS — N250 Renal osteodystrophy: Secondary | ICD-10-CM | POA: Diagnosis not present

## 2022-10-27 DIAGNOSIS — Z992 Dependence on renal dialysis: Secondary | ICD-10-CM | POA: Diagnosis not present

## 2022-10-28 DIAGNOSIS — Z992 Dependence on renal dialysis: Secondary | ICD-10-CM | POA: Diagnosis not present

## 2022-10-28 DIAGNOSIS — N186 End stage renal disease: Secondary | ICD-10-CM | POA: Diagnosis not present

## 2022-10-28 DIAGNOSIS — N250 Renal osteodystrophy: Secondary | ICD-10-CM | POA: Diagnosis not present

## 2022-10-28 DIAGNOSIS — D509 Iron deficiency anemia, unspecified: Secondary | ICD-10-CM | POA: Diagnosis not present

## 2022-10-28 DIAGNOSIS — D631 Anemia in chronic kidney disease: Secondary | ICD-10-CM | POA: Diagnosis not present

## 2022-10-29 DIAGNOSIS — D509 Iron deficiency anemia, unspecified: Secondary | ICD-10-CM | POA: Diagnosis not present

## 2022-10-29 DIAGNOSIS — N250 Renal osteodystrophy: Secondary | ICD-10-CM | POA: Diagnosis not present

## 2022-10-29 DIAGNOSIS — D631 Anemia in chronic kidney disease: Secondary | ICD-10-CM | POA: Diagnosis not present

## 2022-10-29 DIAGNOSIS — N186 End stage renal disease: Secondary | ICD-10-CM | POA: Diagnosis not present

## 2022-10-29 DIAGNOSIS — Z992 Dependence on renal dialysis: Secondary | ICD-10-CM | POA: Diagnosis not present

## 2022-10-30 ENCOUNTER — Encounter (HOSPITAL_COMMUNITY): Payer: Medicare Other

## 2022-10-30 DIAGNOSIS — N250 Renal osteodystrophy: Secondary | ICD-10-CM | POA: Diagnosis not present

## 2022-10-30 DIAGNOSIS — D509 Iron deficiency anemia, unspecified: Secondary | ICD-10-CM | POA: Diagnosis not present

## 2022-10-30 DIAGNOSIS — Z992 Dependence on renal dialysis: Secondary | ICD-10-CM | POA: Diagnosis not present

## 2022-10-30 DIAGNOSIS — N186 End stage renal disease: Secondary | ICD-10-CM | POA: Diagnosis not present

## 2022-10-30 DIAGNOSIS — D631 Anemia in chronic kidney disease: Secondary | ICD-10-CM | POA: Diagnosis not present

## 2022-10-31 DIAGNOSIS — Z992 Dependence on renal dialysis: Secondary | ICD-10-CM | POA: Diagnosis not present

## 2022-10-31 DIAGNOSIS — N186 End stage renal disease: Secondary | ICD-10-CM | POA: Diagnosis not present

## 2022-10-31 DIAGNOSIS — D509 Iron deficiency anemia, unspecified: Secondary | ICD-10-CM | POA: Diagnosis not present

## 2022-10-31 DIAGNOSIS — D631 Anemia in chronic kidney disease: Secondary | ICD-10-CM | POA: Diagnosis not present

## 2022-10-31 DIAGNOSIS — N250 Renal osteodystrophy: Secondary | ICD-10-CM | POA: Diagnosis not present

## 2022-11-01 ENCOUNTER — Encounter (HOSPITAL_COMMUNITY): Payer: Medicare Other

## 2022-11-01 DIAGNOSIS — N186 End stage renal disease: Secondary | ICD-10-CM | POA: Diagnosis not present

## 2022-11-01 DIAGNOSIS — D509 Iron deficiency anemia, unspecified: Secondary | ICD-10-CM | POA: Diagnosis not present

## 2022-11-01 DIAGNOSIS — D631 Anemia in chronic kidney disease: Secondary | ICD-10-CM | POA: Diagnosis not present

## 2022-11-01 DIAGNOSIS — N250 Renal osteodystrophy: Secondary | ICD-10-CM | POA: Diagnosis not present

## 2022-11-01 DIAGNOSIS — Z992 Dependence on renal dialysis: Secondary | ICD-10-CM | POA: Diagnosis not present

## 2022-11-02 DIAGNOSIS — N250 Renal osteodystrophy: Secondary | ICD-10-CM | POA: Diagnosis not present

## 2022-11-02 DIAGNOSIS — D509 Iron deficiency anemia, unspecified: Secondary | ICD-10-CM | POA: Diagnosis not present

## 2022-11-02 DIAGNOSIS — D631 Anemia in chronic kidney disease: Secondary | ICD-10-CM | POA: Diagnosis not present

## 2022-11-02 DIAGNOSIS — N186 End stage renal disease: Secondary | ICD-10-CM | POA: Diagnosis not present

## 2022-11-02 DIAGNOSIS — Z992 Dependence on renal dialysis: Secondary | ICD-10-CM | POA: Diagnosis not present

## 2022-11-03 ENCOUNTER — Encounter (HOSPITAL_COMMUNITY): Payer: Medicare Other

## 2022-11-03 DIAGNOSIS — Z992 Dependence on renal dialysis: Secondary | ICD-10-CM | POA: Diagnosis not present

## 2022-11-03 DIAGNOSIS — N250 Renal osteodystrophy: Secondary | ICD-10-CM | POA: Diagnosis not present

## 2022-11-03 DIAGNOSIS — N186 End stage renal disease: Secondary | ICD-10-CM | POA: Diagnosis not present

## 2022-11-03 DIAGNOSIS — D509 Iron deficiency anemia, unspecified: Secondary | ICD-10-CM | POA: Diagnosis not present

## 2022-11-03 DIAGNOSIS — D631 Anemia in chronic kidney disease: Secondary | ICD-10-CM | POA: Diagnosis not present

## 2022-11-04 DIAGNOSIS — N250 Renal osteodystrophy: Secondary | ICD-10-CM | POA: Diagnosis not present

## 2022-11-04 DIAGNOSIS — D509 Iron deficiency anemia, unspecified: Secondary | ICD-10-CM | POA: Diagnosis not present

## 2022-11-04 DIAGNOSIS — D631 Anemia in chronic kidney disease: Secondary | ICD-10-CM | POA: Diagnosis not present

## 2022-11-04 DIAGNOSIS — Z992 Dependence on renal dialysis: Secondary | ICD-10-CM | POA: Diagnosis not present

## 2022-11-04 DIAGNOSIS — N186 End stage renal disease: Secondary | ICD-10-CM | POA: Diagnosis not present

## 2022-11-05 DIAGNOSIS — D509 Iron deficiency anemia, unspecified: Secondary | ICD-10-CM | POA: Diagnosis not present

## 2022-11-05 DIAGNOSIS — N186 End stage renal disease: Secondary | ICD-10-CM | POA: Diagnosis not present

## 2022-11-05 DIAGNOSIS — Z992 Dependence on renal dialysis: Secondary | ICD-10-CM | POA: Diagnosis not present

## 2022-11-05 DIAGNOSIS — N250 Renal osteodystrophy: Secondary | ICD-10-CM | POA: Diagnosis not present

## 2022-11-05 DIAGNOSIS — D631 Anemia in chronic kidney disease: Secondary | ICD-10-CM | POA: Diagnosis not present

## 2022-11-06 DIAGNOSIS — Z992 Dependence on renal dialysis: Secondary | ICD-10-CM | POA: Diagnosis not present

## 2022-11-06 DIAGNOSIS — N250 Renal osteodystrophy: Secondary | ICD-10-CM | POA: Diagnosis not present

## 2022-11-06 DIAGNOSIS — N186 End stage renal disease: Secondary | ICD-10-CM | POA: Diagnosis not present

## 2022-11-06 DIAGNOSIS — D631 Anemia in chronic kidney disease: Secondary | ICD-10-CM | POA: Diagnosis not present

## 2022-11-06 DIAGNOSIS — D509 Iron deficiency anemia, unspecified: Secondary | ICD-10-CM | POA: Diagnosis not present

## 2022-11-07 DIAGNOSIS — D631 Anemia in chronic kidney disease: Secondary | ICD-10-CM | POA: Diagnosis not present

## 2022-11-07 DIAGNOSIS — D509 Iron deficiency anemia, unspecified: Secondary | ICD-10-CM | POA: Diagnosis not present

## 2022-11-07 DIAGNOSIS — Z992 Dependence on renal dialysis: Secondary | ICD-10-CM | POA: Diagnosis not present

## 2022-11-07 DIAGNOSIS — N250 Renal osteodystrophy: Secondary | ICD-10-CM | POA: Diagnosis not present

## 2022-11-07 DIAGNOSIS — N186 End stage renal disease: Secondary | ICD-10-CM | POA: Diagnosis not present

## 2022-11-08 DIAGNOSIS — N186 End stage renal disease: Secondary | ICD-10-CM | POA: Diagnosis not present

## 2022-11-08 DIAGNOSIS — D631 Anemia in chronic kidney disease: Secondary | ICD-10-CM | POA: Diagnosis not present

## 2022-11-08 DIAGNOSIS — N250 Renal osteodystrophy: Secondary | ICD-10-CM | POA: Diagnosis not present

## 2022-11-08 DIAGNOSIS — Z992 Dependence on renal dialysis: Secondary | ICD-10-CM | POA: Diagnosis not present

## 2022-11-08 DIAGNOSIS — D509 Iron deficiency anemia, unspecified: Secondary | ICD-10-CM | POA: Diagnosis not present

## 2022-11-09 DIAGNOSIS — D631 Anemia in chronic kidney disease: Secondary | ICD-10-CM | POA: Diagnosis not present

## 2022-11-09 DIAGNOSIS — Z992 Dependence on renal dialysis: Secondary | ICD-10-CM | POA: Diagnosis not present

## 2022-11-09 DIAGNOSIS — N250 Renal osteodystrophy: Secondary | ICD-10-CM | POA: Diagnosis not present

## 2022-11-09 DIAGNOSIS — D509 Iron deficiency anemia, unspecified: Secondary | ICD-10-CM | POA: Diagnosis not present

## 2022-11-09 DIAGNOSIS — N186 End stage renal disease: Secondary | ICD-10-CM | POA: Diagnosis not present

## 2022-11-10 DIAGNOSIS — N250 Renal osteodystrophy: Secondary | ICD-10-CM | POA: Diagnosis not present

## 2022-11-10 DIAGNOSIS — D631 Anemia in chronic kidney disease: Secondary | ICD-10-CM | POA: Diagnosis not present

## 2022-11-10 DIAGNOSIS — Z992 Dependence on renal dialysis: Secondary | ICD-10-CM | POA: Diagnosis not present

## 2022-11-10 DIAGNOSIS — D509 Iron deficiency anemia, unspecified: Secondary | ICD-10-CM | POA: Diagnosis not present

## 2022-11-10 DIAGNOSIS — N186 End stage renal disease: Secondary | ICD-10-CM | POA: Diagnosis not present

## 2022-11-11 DIAGNOSIS — D631 Anemia in chronic kidney disease: Secondary | ICD-10-CM | POA: Diagnosis not present

## 2022-11-11 DIAGNOSIS — D509 Iron deficiency anemia, unspecified: Secondary | ICD-10-CM | POA: Diagnosis not present

## 2022-11-11 DIAGNOSIS — N186 End stage renal disease: Secondary | ICD-10-CM | POA: Diagnosis not present

## 2022-11-11 DIAGNOSIS — Z992 Dependence on renal dialysis: Secondary | ICD-10-CM | POA: Diagnosis not present

## 2022-11-11 DIAGNOSIS — N250 Renal osteodystrophy: Secondary | ICD-10-CM | POA: Diagnosis not present

## 2022-11-12 DIAGNOSIS — N250 Renal osteodystrophy: Secondary | ICD-10-CM | POA: Diagnosis not present

## 2022-11-12 DIAGNOSIS — N186 End stage renal disease: Secondary | ICD-10-CM | POA: Diagnosis not present

## 2022-11-12 DIAGNOSIS — D631 Anemia in chronic kidney disease: Secondary | ICD-10-CM | POA: Diagnosis not present

## 2022-11-12 DIAGNOSIS — D509 Iron deficiency anemia, unspecified: Secondary | ICD-10-CM | POA: Diagnosis not present

## 2022-11-12 DIAGNOSIS — Z992 Dependence on renal dialysis: Secondary | ICD-10-CM | POA: Diagnosis not present

## 2022-11-13 DIAGNOSIS — N186 End stage renal disease: Secondary | ICD-10-CM | POA: Diagnosis not present

## 2022-11-13 DIAGNOSIS — D509 Iron deficiency anemia, unspecified: Secondary | ICD-10-CM | POA: Diagnosis not present

## 2022-11-13 DIAGNOSIS — D631 Anemia in chronic kidney disease: Secondary | ICD-10-CM | POA: Diagnosis not present

## 2022-11-13 DIAGNOSIS — Z992 Dependence on renal dialysis: Secondary | ICD-10-CM | POA: Diagnosis not present

## 2022-11-13 DIAGNOSIS — N250 Renal osteodystrophy: Secondary | ICD-10-CM | POA: Diagnosis not present

## 2022-11-14 DIAGNOSIS — D631 Anemia in chronic kidney disease: Secondary | ICD-10-CM | POA: Diagnosis not present

## 2022-11-14 DIAGNOSIS — N250 Renal osteodystrophy: Secondary | ICD-10-CM | POA: Diagnosis not present

## 2022-11-14 DIAGNOSIS — N186 End stage renal disease: Secondary | ICD-10-CM | POA: Diagnosis not present

## 2022-11-14 DIAGNOSIS — D509 Iron deficiency anemia, unspecified: Secondary | ICD-10-CM | POA: Diagnosis not present

## 2022-11-14 DIAGNOSIS — Z992 Dependence on renal dialysis: Secondary | ICD-10-CM | POA: Diagnosis not present

## 2022-11-15 DIAGNOSIS — N186 End stage renal disease: Secondary | ICD-10-CM | POA: Diagnosis not present

## 2022-11-15 DIAGNOSIS — D509 Iron deficiency anemia, unspecified: Secondary | ICD-10-CM | POA: Diagnosis not present

## 2022-11-15 DIAGNOSIS — Z992 Dependence on renal dialysis: Secondary | ICD-10-CM | POA: Diagnosis not present

## 2022-11-15 DIAGNOSIS — D631 Anemia in chronic kidney disease: Secondary | ICD-10-CM | POA: Diagnosis not present

## 2022-11-15 DIAGNOSIS — N250 Renal osteodystrophy: Secondary | ICD-10-CM | POA: Diagnosis not present

## 2022-11-16 ENCOUNTER — Encounter: Payer: Self-pay | Admitting: Pulmonary Disease

## 2022-11-16 ENCOUNTER — Ambulatory Visit (INDEPENDENT_AMBULATORY_CARE_PROVIDER_SITE_OTHER): Payer: Medicare Other | Admitting: Pulmonary Disease

## 2022-11-16 VITALS — BP 146/75 | HR 56 | Ht 64.0 in | Wt 224.8 lb

## 2022-11-16 DIAGNOSIS — N250 Renal osteodystrophy: Secondary | ICD-10-CM | POA: Diagnosis not present

## 2022-11-16 DIAGNOSIS — Z992 Dependence on renal dialysis: Secondary | ICD-10-CM | POA: Diagnosis not present

## 2022-11-16 DIAGNOSIS — D509 Iron deficiency anemia, unspecified: Secondary | ICD-10-CM | POA: Diagnosis not present

## 2022-11-16 DIAGNOSIS — D631 Anemia in chronic kidney disease: Secondary | ICD-10-CM | POA: Diagnosis not present

## 2022-11-16 DIAGNOSIS — J4540 Moderate persistent asthma, uncomplicated: Secondary | ICD-10-CM

## 2022-11-16 DIAGNOSIS — G4733 Obstructive sleep apnea (adult) (pediatric): Secondary | ICD-10-CM | POA: Diagnosis not present

## 2022-11-16 DIAGNOSIS — N186 End stage renal disease: Secondary | ICD-10-CM | POA: Diagnosis not present

## 2022-11-16 MED ORDER — AZITHROMYCIN 250 MG PO TABS: ORAL_TABLET | ORAL | 0 refills | Status: AC

## 2022-11-16 NOTE — Assessment & Plan Note (Signed)
Continue Symbicort.  Does not seem like an exacerbation. Cough may be related to allergies but will also treat for asthmatic bronchitis given brown sputum with Z-Pak. Asked him to take Mucinex for 5 days. Use Zyrtec for allergies and continue Flonase

## 2022-11-16 NOTE — Patient Instructions (Signed)
X Rx for zpak  Take Mucinex 600 mg twice daily for 5 days. Okay to take Zyrtec 10 mg daily for allergies  Continue on Symbicort 2 puffs twice daily  x obtain download on Luna machine
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
## 2022-11-16 NOTE — Addendum Note (Signed)
Addended by: Jaynee Eagles on: 11/16/2022 11:33 AM   Modules accepted: Orders

## 2022-11-16 NOTE — Assessment & Plan Note (Addendum)
He is very compliant and CPAP is only helped decrease his daytime somnolence and fatigue. Will obtain download on his Luna machine.  His compliance has been affected by his bad cough  Weight loss encouraged, compliance with goal of at least 4-6 hrs every night is the expectation. Advised against medications with sedative side effects Cautioned against driving when sleepy - understanding that sleepiness will vary on a day to day basis

## 2022-11-17 DIAGNOSIS — N186 End stage renal disease: Secondary | ICD-10-CM | POA: Diagnosis not present

## 2022-11-17 DIAGNOSIS — N250 Renal osteodystrophy: Secondary | ICD-10-CM | POA: Diagnosis not present

## 2022-11-17 DIAGNOSIS — Z992 Dependence on renal dialysis: Secondary | ICD-10-CM | POA: Diagnosis not present

## 2022-11-17 DIAGNOSIS — D509 Iron deficiency anemia, unspecified: Secondary | ICD-10-CM | POA: Diagnosis not present

## 2022-11-17 DIAGNOSIS — D631 Anemia in chronic kidney disease: Secondary | ICD-10-CM | POA: Diagnosis not present

## 2022-11-18 DIAGNOSIS — N250 Renal osteodystrophy: Secondary | ICD-10-CM | POA: Diagnosis not present

## 2022-11-18 DIAGNOSIS — D509 Iron deficiency anemia, unspecified: Secondary | ICD-10-CM | POA: Diagnosis not present

## 2022-11-18 DIAGNOSIS — D631 Anemia in chronic kidney disease: Secondary | ICD-10-CM | POA: Diagnosis not present

## 2022-11-18 DIAGNOSIS — N186 End stage renal disease: Secondary | ICD-10-CM | POA: Diagnosis not present

## 2022-11-18 DIAGNOSIS — Z992 Dependence on renal dialysis: Secondary | ICD-10-CM | POA: Diagnosis not present

## 2022-11-19 DIAGNOSIS — N186 End stage renal disease: Secondary | ICD-10-CM | POA: Diagnosis not present

## 2022-11-19 DIAGNOSIS — D509 Iron deficiency anemia, unspecified: Secondary | ICD-10-CM | POA: Diagnosis not present

## 2022-11-19 DIAGNOSIS — Z992 Dependence on renal dialysis: Secondary | ICD-10-CM | POA: Diagnosis not present

## 2022-11-19 DIAGNOSIS — N250 Renal osteodystrophy: Secondary | ICD-10-CM | POA: Diagnosis not present

## 2022-11-19 DIAGNOSIS — D631 Anemia in chronic kidney disease: Secondary | ICD-10-CM | POA: Diagnosis not present

## 2022-11-20 DIAGNOSIS — Z992 Dependence on renal dialysis: Secondary | ICD-10-CM | POA: Diagnosis not present

## 2022-11-20 DIAGNOSIS — N186 End stage renal disease: Secondary | ICD-10-CM | POA: Diagnosis not present

## 2022-11-20 DIAGNOSIS — D509 Iron deficiency anemia, unspecified: Secondary | ICD-10-CM | POA: Diagnosis not present

## 2022-11-20 DIAGNOSIS — N250 Renal osteodystrophy: Secondary | ICD-10-CM | POA: Diagnosis not present

## 2022-11-20 DIAGNOSIS — D631 Anemia in chronic kidney disease: Secondary | ICD-10-CM | POA: Diagnosis not present

## 2022-11-21 DIAGNOSIS — N186 End stage renal disease: Secondary | ICD-10-CM | POA: Diagnosis not present

## 2022-11-21 DIAGNOSIS — D631 Anemia in chronic kidney disease: Secondary | ICD-10-CM | POA: Diagnosis not present

## 2022-11-21 DIAGNOSIS — Z992 Dependence on renal dialysis: Secondary | ICD-10-CM | POA: Diagnosis not present

## 2022-11-21 DIAGNOSIS — N250 Renal osteodystrophy: Secondary | ICD-10-CM | POA: Diagnosis not present

## 2022-11-21 DIAGNOSIS — D509 Iron deficiency anemia, unspecified: Secondary | ICD-10-CM | POA: Diagnosis not present

## 2022-11-22 DIAGNOSIS — Z992 Dependence on renal dialysis: Secondary | ICD-10-CM | POA: Diagnosis not present

## 2022-11-22 DIAGNOSIS — N250 Renal osteodystrophy: Secondary | ICD-10-CM | POA: Diagnosis not present

## 2022-11-22 DIAGNOSIS — D631 Anemia in chronic kidney disease: Secondary | ICD-10-CM | POA: Diagnosis not present

## 2022-11-22 DIAGNOSIS — N186 End stage renal disease: Secondary | ICD-10-CM | POA: Diagnosis not present

## 2022-11-22 DIAGNOSIS — D509 Iron deficiency anemia, unspecified: Secondary | ICD-10-CM | POA: Diagnosis not present

## 2022-11-23 ENCOUNTER — Ambulatory Visit (INDEPENDENT_AMBULATORY_CARE_PROVIDER_SITE_OTHER): Payer: Medicare Other | Admitting: Orthopedic Surgery

## 2022-11-23 DIAGNOSIS — F40240 Claustrophobia: Secondary | ICD-10-CM | POA: Diagnosis not present

## 2022-11-23 DIAGNOSIS — G8929 Other chronic pain: Secondary | ICD-10-CM

## 2022-11-23 DIAGNOSIS — N250 Renal osteodystrophy: Secondary | ICD-10-CM | POA: Diagnosis not present

## 2022-11-23 DIAGNOSIS — S134XXA Sprain of ligaments of cervical spine, initial encounter: Secondary | ICD-10-CM

## 2022-11-23 DIAGNOSIS — N186 End stage renal disease: Secondary | ICD-10-CM | POA: Diagnosis not present

## 2022-11-23 DIAGNOSIS — S8001XD Contusion of right knee, subsequent encounter: Secondary | ICD-10-CM | POA: Diagnosis not present

## 2022-11-23 DIAGNOSIS — D631 Anemia in chronic kidney disease: Secondary | ICD-10-CM | POA: Diagnosis not present

## 2022-11-23 DIAGNOSIS — M542 Cervicalgia: Secondary | ICD-10-CM | POA: Diagnosis not present

## 2022-11-23 DIAGNOSIS — M25511 Pain in right shoulder: Secondary | ICD-10-CM | POA: Diagnosis not present

## 2022-11-23 DIAGNOSIS — S8002XD Contusion of left knee, subsequent encounter: Secondary | ICD-10-CM

## 2022-11-23 DIAGNOSIS — Z992 Dependence on renal dialysis: Secondary | ICD-10-CM | POA: Diagnosis not present

## 2022-11-23 DIAGNOSIS — D509 Iron deficiency anemia, unspecified: Secondary | ICD-10-CM | POA: Diagnosis not present

## 2022-11-23 MED ORDER — ALPRAZOLAM 0.25 MG PO TABS: 0.2500 mg | ORAL_TABLET | Freq: Every evening | ORAL | 0 refills | Status: DC | PRN

## 2022-11-23 NOTE — Patient Instructions (Signed)
While we are working on your approval for MRI please go ahead and call to schedule your appointment with [facility] within at least one (1) week.   Central Scheduling [phone]  
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
## 2022-11-24 DIAGNOSIS — D631 Anemia in chronic kidney disease: Secondary | ICD-10-CM | POA: Diagnosis not present

## 2022-11-24 DIAGNOSIS — Z992 Dependence on renal dialysis: Secondary | ICD-10-CM | POA: Diagnosis not present

## 2022-11-24 DIAGNOSIS — N186 End stage renal disease: Secondary | ICD-10-CM | POA: Diagnosis not present

## 2022-11-24 DIAGNOSIS — D509 Iron deficiency anemia, unspecified: Secondary | ICD-10-CM | POA: Diagnosis not present

## 2022-11-24 DIAGNOSIS — N250 Renal osteodystrophy: Secondary | ICD-10-CM | POA: Diagnosis not present

## 2022-11-25 DIAGNOSIS — N186 End stage renal disease: Secondary | ICD-10-CM | POA: Diagnosis not present

## 2022-11-25 DIAGNOSIS — D631 Anemia in chronic kidney disease: Secondary | ICD-10-CM | POA: Diagnosis not present

## 2022-11-25 DIAGNOSIS — Z992 Dependence on renal dialysis: Secondary | ICD-10-CM | POA: Diagnosis not present

## 2022-11-25 DIAGNOSIS — N250 Renal osteodystrophy: Secondary | ICD-10-CM | POA: Diagnosis not present

## 2022-11-25 DIAGNOSIS — D509 Iron deficiency anemia, unspecified: Secondary | ICD-10-CM | POA: Diagnosis not present

## 2022-11-26 DIAGNOSIS — Z992 Dependence on renal dialysis: Secondary | ICD-10-CM | POA: Diagnosis not present

## 2022-11-26 DIAGNOSIS — D631 Anemia in chronic kidney disease: Secondary | ICD-10-CM | POA: Diagnosis not present

## 2022-11-26 DIAGNOSIS — N186 End stage renal disease: Secondary | ICD-10-CM | POA: Diagnosis not present

## 2022-11-26 DIAGNOSIS — N250 Renal osteodystrophy: Secondary | ICD-10-CM | POA: Diagnosis not present

## 2022-11-26 DIAGNOSIS — D509 Iron deficiency anemia, unspecified: Secondary | ICD-10-CM | POA: Diagnosis not present

## 2022-11-27 DIAGNOSIS — D631 Anemia in chronic kidney disease: Secondary | ICD-10-CM | POA: Diagnosis not present

## 2022-11-27 DIAGNOSIS — Z992 Dependence on renal dialysis: Secondary | ICD-10-CM | POA: Diagnosis not present

## 2022-11-27 DIAGNOSIS — D509 Iron deficiency anemia, unspecified: Secondary | ICD-10-CM | POA: Diagnosis not present

## 2022-11-27 DIAGNOSIS — N250 Renal osteodystrophy: Secondary | ICD-10-CM | POA: Diagnosis not present

## 2022-11-27 DIAGNOSIS — N186 End stage renal disease: Secondary | ICD-10-CM | POA: Diagnosis not present

## 2022-11-28 DIAGNOSIS — N250 Renal osteodystrophy: Secondary | ICD-10-CM | POA: Diagnosis not present

## 2022-11-28 DIAGNOSIS — D509 Iron deficiency anemia, unspecified: Secondary | ICD-10-CM | POA: Diagnosis not present

## 2022-11-28 DIAGNOSIS — D631 Anemia in chronic kidney disease: Secondary | ICD-10-CM | POA: Diagnosis not present

## 2022-11-28 DIAGNOSIS — N186 End stage renal disease: Secondary | ICD-10-CM | POA: Diagnosis not present

## 2022-11-28 DIAGNOSIS — Z992 Dependence on renal dialysis: Secondary | ICD-10-CM | POA: Diagnosis not present

## 2022-11-29 DIAGNOSIS — D509 Iron deficiency anemia, unspecified: Secondary | ICD-10-CM | POA: Diagnosis not present

## 2022-11-29 DIAGNOSIS — Z992 Dependence on renal dialysis: Secondary | ICD-10-CM | POA: Diagnosis not present

## 2022-11-29 DIAGNOSIS — N250 Renal osteodystrophy: Secondary | ICD-10-CM | POA: Diagnosis not present

## 2022-11-29 DIAGNOSIS — N186 End stage renal disease: Secondary | ICD-10-CM | POA: Diagnosis not present

## 2022-11-29 DIAGNOSIS — D631 Anemia in chronic kidney disease: Secondary | ICD-10-CM | POA: Diagnosis not present

## 2022-11-30 DIAGNOSIS — N250 Renal osteodystrophy: Secondary | ICD-10-CM | POA: Diagnosis not present

## 2022-11-30 DIAGNOSIS — N186 End stage renal disease: Secondary | ICD-10-CM | POA: Diagnosis not present

## 2022-11-30 DIAGNOSIS — D631 Anemia in chronic kidney disease: Secondary | ICD-10-CM | POA: Diagnosis not present

## 2022-11-30 DIAGNOSIS — D509 Iron deficiency anemia, unspecified: Secondary | ICD-10-CM | POA: Diagnosis not present

## 2022-11-30 DIAGNOSIS — Z992 Dependence on renal dialysis: Secondary | ICD-10-CM | POA: Diagnosis not present

## 2022-12-01 DIAGNOSIS — N250 Renal osteodystrophy: Secondary | ICD-10-CM | POA: Diagnosis not present

## 2022-12-01 DIAGNOSIS — Z992 Dependence on renal dialysis: Secondary | ICD-10-CM | POA: Diagnosis not present

## 2022-12-01 DIAGNOSIS — N186 End stage renal disease: Secondary | ICD-10-CM | POA: Diagnosis not present

## 2022-12-01 DIAGNOSIS — D509 Iron deficiency anemia, unspecified: Secondary | ICD-10-CM | POA: Diagnosis not present

## 2022-12-01 DIAGNOSIS — D631 Anemia in chronic kidney disease: Secondary | ICD-10-CM | POA: Diagnosis not present

## 2022-12-02 DIAGNOSIS — Z992 Dependence on renal dialysis: Secondary | ICD-10-CM | POA: Diagnosis not present

## 2022-12-02 DIAGNOSIS — N250 Renal osteodystrophy: Secondary | ICD-10-CM | POA: Diagnosis not present

## 2022-12-02 DIAGNOSIS — N186 End stage renal disease: Secondary | ICD-10-CM | POA: Diagnosis not present

## 2022-12-02 DIAGNOSIS — D631 Anemia in chronic kidney disease: Secondary | ICD-10-CM | POA: Diagnosis not present

## 2022-12-02 DIAGNOSIS — D509 Iron deficiency anemia, unspecified: Secondary | ICD-10-CM | POA: Diagnosis not present

## 2022-12-03 DIAGNOSIS — D631 Anemia in chronic kidney disease: Secondary | ICD-10-CM | POA: Diagnosis not present

## 2022-12-03 DIAGNOSIS — D509 Iron deficiency anemia, unspecified: Secondary | ICD-10-CM | POA: Diagnosis not present

## 2022-12-03 DIAGNOSIS — N250 Renal osteodystrophy: Secondary | ICD-10-CM | POA: Diagnosis not present

## 2022-12-03 DIAGNOSIS — N186 End stage renal disease: Secondary | ICD-10-CM | POA: Diagnosis not present

## 2022-12-03 DIAGNOSIS — Z992 Dependence on renal dialysis: Secondary | ICD-10-CM | POA: Diagnosis not present

## 2022-12-04 DIAGNOSIS — D631 Anemia in chronic kidney disease: Secondary | ICD-10-CM | POA: Diagnosis not present

## 2022-12-04 DIAGNOSIS — Z992 Dependence on renal dialysis: Secondary | ICD-10-CM | POA: Diagnosis not present

## 2022-12-04 DIAGNOSIS — N186 End stage renal disease: Secondary | ICD-10-CM | POA: Diagnosis not present

## 2022-12-04 DIAGNOSIS — N250 Renal osteodystrophy: Secondary | ICD-10-CM | POA: Diagnosis not present

## 2022-12-04 DIAGNOSIS — D509 Iron deficiency anemia, unspecified: Secondary | ICD-10-CM | POA: Diagnosis not present

## 2022-12-05 DIAGNOSIS — N250 Renal osteodystrophy: Secondary | ICD-10-CM | POA: Diagnosis not present

## 2022-12-05 DIAGNOSIS — D631 Anemia in chronic kidney disease: Secondary | ICD-10-CM | POA: Diagnosis not present

## 2022-12-05 DIAGNOSIS — L84 Corns and callosities: Secondary | ICD-10-CM | POA: Diagnosis not present

## 2022-12-05 DIAGNOSIS — E1142 Type 2 diabetes mellitus with diabetic polyneuropathy: Secondary | ICD-10-CM | POA: Diagnosis not present

## 2022-12-05 DIAGNOSIS — Z992 Dependence on renal dialysis: Secondary | ICD-10-CM | POA: Diagnosis not present

## 2022-12-05 DIAGNOSIS — B351 Tinea unguium: Secondary | ICD-10-CM | POA: Diagnosis not present

## 2022-12-05 DIAGNOSIS — N186 End stage renal disease: Secondary | ICD-10-CM | POA: Diagnosis not present

## 2022-12-05 DIAGNOSIS — D509 Iron deficiency anemia, unspecified: Secondary | ICD-10-CM | POA: Diagnosis not present

## 2022-12-06 DIAGNOSIS — N250 Renal osteodystrophy: Secondary | ICD-10-CM | POA: Diagnosis not present

## 2022-12-06 DIAGNOSIS — D509 Iron deficiency anemia, unspecified: Secondary | ICD-10-CM | POA: Diagnosis not present

## 2022-12-06 DIAGNOSIS — D631 Anemia in chronic kidney disease: Secondary | ICD-10-CM | POA: Diagnosis not present

## 2022-12-06 DIAGNOSIS — Z992 Dependence on renal dialysis: Secondary | ICD-10-CM | POA: Diagnosis not present

## 2022-12-06 DIAGNOSIS — N186 End stage renal disease: Secondary | ICD-10-CM | POA: Diagnosis not present

## 2022-12-07 DIAGNOSIS — Z992 Dependence on renal dialysis: Secondary | ICD-10-CM | POA: Diagnosis not present

## 2022-12-07 DIAGNOSIS — N186 End stage renal disease: Secondary | ICD-10-CM | POA: Diagnosis not present

## 2022-12-07 DIAGNOSIS — D631 Anemia in chronic kidney disease: Secondary | ICD-10-CM | POA: Diagnosis not present

## 2022-12-07 DIAGNOSIS — D509 Iron deficiency anemia, unspecified: Secondary | ICD-10-CM | POA: Diagnosis not present

## 2022-12-07 DIAGNOSIS — N250 Renal osteodystrophy: Secondary | ICD-10-CM | POA: Diagnosis not present

## 2022-12-08 DIAGNOSIS — D509 Iron deficiency anemia, unspecified: Secondary | ICD-10-CM | POA: Diagnosis not present

## 2022-12-08 DIAGNOSIS — N250 Renal osteodystrophy: Secondary | ICD-10-CM | POA: Diagnosis not present

## 2022-12-08 DIAGNOSIS — N186 End stage renal disease: Secondary | ICD-10-CM | POA: Diagnosis not present

## 2022-12-08 DIAGNOSIS — Z992 Dependence on renal dialysis: Secondary | ICD-10-CM | POA: Diagnosis not present

## 2022-12-08 DIAGNOSIS — D631 Anemia in chronic kidney disease: Secondary | ICD-10-CM | POA: Diagnosis not present

## 2022-12-09 DIAGNOSIS — N250 Renal osteodystrophy: Secondary | ICD-10-CM | POA: Diagnosis not present

## 2022-12-09 DIAGNOSIS — N186 End stage renal disease: Secondary | ICD-10-CM | POA: Diagnosis not present

## 2022-12-09 DIAGNOSIS — Z992 Dependence on renal dialysis: Secondary | ICD-10-CM | POA: Diagnosis not present

## 2022-12-09 DIAGNOSIS — D509 Iron deficiency anemia, unspecified: Secondary | ICD-10-CM | POA: Diagnosis not present

## 2022-12-09 DIAGNOSIS — D631 Anemia in chronic kidney disease: Secondary | ICD-10-CM | POA: Diagnosis not present

## 2022-12-10 DIAGNOSIS — Z992 Dependence on renal dialysis: Secondary | ICD-10-CM | POA: Diagnosis not present

## 2022-12-10 DIAGNOSIS — N250 Renal osteodystrophy: Secondary | ICD-10-CM | POA: Diagnosis not present

## 2022-12-10 DIAGNOSIS — D631 Anemia in chronic kidney disease: Secondary | ICD-10-CM | POA: Diagnosis not present

## 2022-12-10 DIAGNOSIS — N186 End stage renal disease: Secondary | ICD-10-CM | POA: Diagnosis not present

## 2022-12-10 DIAGNOSIS — D509 Iron deficiency anemia, unspecified: Secondary | ICD-10-CM | POA: Diagnosis not present

## 2022-12-11 DIAGNOSIS — D631 Anemia in chronic kidney disease: Secondary | ICD-10-CM | POA: Diagnosis not present

## 2022-12-11 DIAGNOSIS — D509 Iron deficiency anemia, unspecified: Secondary | ICD-10-CM | POA: Diagnosis not present

## 2022-12-11 DIAGNOSIS — Z992 Dependence on renal dialysis: Secondary | ICD-10-CM | POA: Diagnosis not present

## 2022-12-11 DIAGNOSIS — N186 End stage renal disease: Secondary | ICD-10-CM | POA: Diagnosis not present

## 2022-12-11 DIAGNOSIS — N250 Renal osteodystrophy: Secondary | ICD-10-CM | POA: Diagnosis not present

## 2022-12-12 DIAGNOSIS — Z992 Dependence on renal dialysis: Secondary | ICD-10-CM | POA: Diagnosis not present

## 2022-12-12 DIAGNOSIS — D509 Iron deficiency anemia, unspecified: Secondary | ICD-10-CM | POA: Diagnosis not present

## 2022-12-12 DIAGNOSIS — D631 Anemia in chronic kidney disease: Secondary | ICD-10-CM | POA: Diagnosis not present

## 2022-12-12 DIAGNOSIS — N186 End stage renal disease: Secondary | ICD-10-CM | POA: Diagnosis not present

## 2022-12-12 DIAGNOSIS — N250 Renal osteodystrophy: Secondary | ICD-10-CM | POA: Diagnosis not present

## 2022-12-13 DIAGNOSIS — N250 Renal osteodystrophy: Secondary | ICD-10-CM | POA: Diagnosis not present

## 2022-12-13 DIAGNOSIS — Z992 Dependence on renal dialysis: Secondary | ICD-10-CM | POA: Diagnosis not present

## 2022-12-13 DIAGNOSIS — D631 Anemia in chronic kidney disease: Secondary | ICD-10-CM | POA: Diagnosis not present

## 2022-12-13 DIAGNOSIS — N186 End stage renal disease: Secondary | ICD-10-CM | POA: Diagnosis not present

## 2022-12-13 DIAGNOSIS — D509 Iron deficiency anemia, unspecified: Secondary | ICD-10-CM | POA: Diagnosis not present

## 2022-12-14 DIAGNOSIS — Z992 Dependence on renal dialysis: Secondary | ICD-10-CM | POA: Diagnosis not present

## 2022-12-14 DIAGNOSIS — N250 Renal osteodystrophy: Secondary | ICD-10-CM | POA: Diagnosis not present

## 2022-12-14 DIAGNOSIS — D509 Iron deficiency anemia, unspecified: Secondary | ICD-10-CM | POA: Diagnosis not present

## 2022-12-14 DIAGNOSIS — N186 End stage renal disease: Secondary | ICD-10-CM | POA: Diagnosis not present

## 2022-12-14 DIAGNOSIS — D631 Anemia in chronic kidney disease: Secondary | ICD-10-CM | POA: Diagnosis not present

## 2022-12-15 DIAGNOSIS — D509 Iron deficiency anemia, unspecified: Secondary | ICD-10-CM | POA: Diagnosis not present

## 2022-12-15 DIAGNOSIS — N186 End stage renal disease: Secondary | ICD-10-CM | POA: Diagnosis not present

## 2022-12-15 DIAGNOSIS — D631 Anemia in chronic kidney disease: Secondary | ICD-10-CM | POA: Diagnosis not present

## 2022-12-15 DIAGNOSIS — Z992 Dependence on renal dialysis: Secondary | ICD-10-CM | POA: Diagnosis not present

## 2022-12-15 DIAGNOSIS — N250 Renal osteodystrophy: Secondary | ICD-10-CM | POA: Diagnosis not present

## 2022-12-16 DIAGNOSIS — D631 Anemia in chronic kidney disease: Secondary | ICD-10-CM | POA: Diagnosis not present

## 2022-12-16 DIAGNOSIS — N186 End stage renal disease: Secondary | ICD-10-CM | POA: Diagnosis not present

## 2022-12-16 DIAGNOSIS — N250 Renal osteodystrophy: Secondary | ICD-10-CM | POA: Diagnosis not present

## 2022-12-16 DIAGNOSIS — Z992 Dependence on renal dialysis: Secondary | ICD-10-CM | POA: Diagnosis not present

## 2022-12-16 DIAGNOSIS — D509 Iron deficiency anemia, unspecified: Secondary | ICD-10-CM | POA: Diagnosis not present

## 2022-12-17 DIAGNOSIS — Z992 Dependence on renal dialysis: Secondary | ICD-10-CM | POA: Diagnosis not present

## 2022-12-17 DIAGNOSIS — N250 Renal osteodystrophy: Secondary | ICD-10-CM | POA: Diagnosis not present

## 2022-12-17 DIAGNOSIS — N186 End stage renal disease: Secondary | ICD-10-CM | POA: Diagnosis not present

## 2022-12-17 DIAGNOSIS — D509 Iron deficiency anemia, unspecified: Secondary | ICD-10-CM | POA: Diagnosis not present

## 2022-12-17 DIAGNOSIS — D631 Anemia in chronic kidney disease: Secondary | ICD-10-CM | POA: Diagnosis not present

## 2022-12-18 DIAGNOSIS — D631 Anemia in chronic kidney disease: Secondary | ICD-10-CM | POA: Diagnosis not present

## 2022-12-18 DIAGNOSIS — D509 Iron deficiency anemia, unspecified: Secondary | ICD-10-CM | POA: Diagnosis not present

## 2022-12-18 DIAGNOSIS — Z992 Dependence on renal dialysis: Secondary | ICD-10-CM | POA: Diagnosis not present

## 2022-12-18 DIAGNOSIS — N186 End stage renal disease: Secondary | ICD-10-CM | POA: Diagnosis not present

## 2022-12-18 DIAGNOSIS — N250 Renal osteodystrophy: Secondary | ICD-10-CM | POA: Diagnosis not present

## 2022-12-19 DIAGNOSIS — N186 End stage renal disease: Secondary | ICD-10-CM | POA: Diagnosis not present

## 2022-12-19 DIAGNOSIS — N250 Renal osteodystrophy: Secondary | ICD-10-CM | POA: Diagnosis not present

## 2022-12-19 DIAGNOSIS — Z992 Dependence on renal dialysis: Secondary | ICD-10-CM | POA: Diagnosis not present

## 2022-12-19 DIAGNOSIS — D509 Iron deficiency anemia, unspecified: Secondary | ICD-10-CM | POA: Diagnosis not present

## 2022-12-19 DIAGNOSIS — D631 Anemia in chronic kidney disease: Secondary | ICD-10-CM | POA: Diagnosis not present

## 2022-12-20 DIAGNOSIS — N250 Renal osteodystrophy: Secondary | ICD-10-CM | POA: Diagnosis not present

## 2022-12-20 DIAGNOSIS — Z992 Dependence on renal dialysis: Secondary | ICD-10-CM | POA: Diagnosis not present

## 2022-12-20 DIAGNOSIS — D631 Anemia in chronic kidney disease: Secondary | ICD-10-CM | POA: Diagnosis not present

## 2022-12-20 DIAGNOSIS — N186 End stage renal disease: Secondary | ICD-10-CM | POA: Diagnosis not present

## 2022-12-20 DIAGNOSIS — D509 Iron deficiency anemia, unspecified: Secondary | ICD-10-CM | POA: Diagnosis not present

## 2022-12-21 ENCOUNTER — Telehealth: Payer: Self-pay | Admitting: Internal Medicine

## 2022-12-21 ENCOUNTER — Ambulatory Visit (HOSPITAL_COMMUNITY)
Admission: RE | Admit: 2022-12-21 | Discharge: 2022-12-21 | Disposition: A | Discharge status: Home / Self Care | Payer: Medicare Other | Source: Ambulatory Visit | Attending: Orthopedic Surgery | Admitting: Orthopedic Surgery

## 2022-12-21 DIAGNOSIS — M25511 Pain in right shoulder: Secondary | ICD-10-CM | POA: Diagnosis not present

## 2022-12-21 DIAGNOSIS — D631 Anemia in chronic kidney disease: Secondary | ICD-10-CM | POA: Diagnosis not present

## 2022-12-21 DIAGNOSIS — N186 End stage renal disease: Secondary | ICD-10-CM | POA: Diagnosis not present

## 2022-12-21 DIAGNOSIS — Z992 Dependence on renal dialysis: Secondary | ICD-10-CM | POA: Diagnosis not present

## 2022-12-21 DIAGNOSIS — D509 Iron deficiency anemia, unspecified: Secondary | ICD-10-CM | POA: Diagnosis not present

## 2022-12-21 DIAGNOSIS — N250 Renal osteodystrophy: Secondary | ICD-10-CM | POA: Diagnosis not present

## 2022-12-21 DIAGNOSIS — G8929 Other chronic pain: Secondary | ICD-10-CM | POA: Insufficient documentation

## 2022-12-21 NOTE — Telephone Encounter (Signed)
Patient is asking for a referral for cardiac rehab. He said that his referral has expired.

## 2022-12-21 NOTE — Telephone Encounter (Signed)
Call to patient, LVM to return call to office

## 2022-12-22 DIAGNOSIS — D509 Iron deficiency anemia, unspecified: Secondary | ICD-10-CM | POA: Diagnosis not present

## 2022-12-22 DIAGNOSIS — Z992 Dependence on renal dialysis: Secondary | ICD-10-CM | POA: Diagnosis not present

## 2022-12-22 DIAGNOSIS — N186 End stage renal disease: Secondary | ICD-10-CM | POA: Diagnosis not present

## 2022-12-22 DIAGNOSIS — N250 Renal osteodystrophy: Secondary | ICD-10-CM | POA: Diagnosis not present

## 2022-12-22 DIAGNOSIS — D631 Anemia in chronic kidney disease: Secondary | ICD-10-CM | POA: Diagnosis not present

## 2022-12-22 NOTE — Telephone Encounter (Signed)
Second attempt. LVM to call office

## 2022-12-23 DIAGNOSIS — Z992 Dependence on renal dialysis: Secondary | ICD-10-CM | POA: Diagnosis not present

## 2022-12-23 DIAGNOSIS — N250 Renal osteodystrophy: Secondary | ICD-10-CM | POA: Diagnosis not present

## 2022-12-23 DIAGNOSIS — N186 End stage renal disease: Secondary | ICD-10-CM | POA: Diagnosis not present

## 2022-12-23 DIAGNOSIS — D631 Anemia in chronic kidney disease: Secondary | ICD-10-CM | POA: Diagnosis not present

## 2022-12-23 DIAGNOSIS — D509 Iron deficiency anemia, unspecified: Secondary | ICD-10-CM | POA: Diagnosis not present

## 2022-12-24 DIAGNOSIS — D631 Anemia in chronic kidney disease: Secondary | ICD-10-CM | POA: Diagnosis not present

## 2022-12-24 DIAGNOSIS — D509 Iron deficiency anemia, unspecified: Secondary | ICD-10-CM | POA: Diagnosis not present

## 2022-12-24 DIAGNOSIS — Z992 Dependence on renal dialysis: Secondary | ICD-10-CM | POA: Diagnosis not present

## 2022-12-24 DIAGNOSIS — N250 Renal osteodystrophy: Secondary | ICD-10-CM | POA: Diagnosis not present

## 2022-12-24 DIAGNOSIS — N186 End stage renal disease: Secondary | ICD-10-CM | POA: Diagnosis not present

## 2022-12-25 DIAGNOSIS — Z992 Dependence on renal dialysis: Secondary | ICD-10-CM | POA: Diagnosis not present

## 2022-12-25 DIAGNOSIS — D631 Anemia in chronic kidney disease: Secondary | ICD-10-CM | POA: Diagnosis not present

## 2022-12-25 DIAGNOSIS — N186 End stage renal disease: Secondary | ICD-10-CM | POA: Diagnosis not present

## 2022-12-25 DIAGNOSIS — N250 Renal osteodystrophy: Secondary | ICD-10-CM | POA: Diagnosis not present

## 2022-12-25 DIAGNOSIS — D509 Iron deficiency anemia, unspecified: Secondary | ICD-10-CM | POA: Diagnosis not present

## 2022-12-25 NOTE — Telephone Encounter (Signed)
Patient is returning call. Requesting call back 

## 2022-12-25 NOTE — Telephone Encounter (Signed)
Spoke with pt regarding needing a new order for cardiac rehab. Placed new order for cardiac rehab and let pt know that I did this. Pt verbalizes understanding.

## 2022-12-26 DIAGNOSIS — N250 Renal osteodystrophy: Secondary | ICD-10-CM | POA: Diagnosis not present

## 2022-12-26 DIAGNOSIS — D509 Iron deficiency anemia, unspecified: Secondary | ICD-10-CM | POA: Diagnosis not present

## 2022-12-26 DIAGNOSIS — N186 End stage renal disease: Secondary | ICD-10-CM | POA: Diagnosis not present

## 2022-12-26 DIAGNOSIS — D631 Anemia in chronic kidney disease: Secondary | ICD-10-CM | POA: Diagnosis not present

## 2022-12-26 DIAGNOSIS — Z992 Dependence on renal dialysis: Secondary | ICD-10-CM | POA: Diagnosis not present

## 2022-12-27 DIAGNOSIS — N186 End stage renal disease: Secondary | ICD-10-CM | POA: Diagnosis not present

## 2022-12-27 DIAGNOSIS — D631 Anemia in chronic kidney disease: Secondary | ICD-10-CM | POA: Diagnosis not present

## 2022-12-27 DIAGNOSIS — Z992 Dependence on renal dialysis: Secondary | ICD-10-CM | POA: Diagnosis not present

## 2022-12-27 DIAGNOSIS — D509 Iron deficiency anemia, unspecified: Secondary | ICD-10-CM | POA: Diagnosis not present

## 2022-12-27 DIAGNOSIS — N250 Renal osteodystrophy: Secondary | ICD-10-CM | POA: Diagnosis not present

## 2022-12-28 ENCOUNTER — Encounter: Payer: Self-pay | Admitting: Orthopedic Surgery

## 2022-12-28 ENCOUNTER — Other Ambulatory Visit (HOSPITAL_COMMUNITY): Payer: Self-pay

## 2022-12-28 ENCOUNTER — Ambulatory Visit (INDEPENDENT_AMBULATORY_CARE_PROVIDER_SITE_OTHER): Payer: Medicare Other | Admitting: Orthopedic Surgery

## 2022-12-28 VITALS — BP 155/84 | HR 56 | Ht 64.0 in | Wt 228.0 lb

## 2022-12-28 DIAGNOSIS — S134XXA Sprain of ligaments of cervical spine, initial encounter: Secondary | ICD-10-CM | POA: Diagnosis not present

## 2022-12-28 DIAGNOSIS — D509 Iron deficiency anemia, unspecified: Secondary | ICD-10-CM | POA: Diagnosis not present

## 2022-12-28 DIAGNOSIS — I214 Non-ST elevation (NSTEMI) myocardial infarction: Secondary | ICD-10-CM

## 2022-12-28 DIAGNOSIS — M6780 Other specified disorders of synovium and tendon, unspecified site: Secondary | ICD-10-CM | POA: Diagnosis not present

## 2022-12-28 DIAGNOSIS — G8929 Other chronic pain: Secondary | ICD-10-CM | POA: Diagnosis not present

## 2022-12-28 DIAGNOSIS — Z951 Presence of aortocoronary bypass graft: Secondary | ICD-10-CM

## 2022-12-28 DIAGNOSIS — N186 End stage renal disease: Secondary | ICD-10-CM | POA: Diagnosis not present

## 2022-12-28 DIAGNOSIS — Z992 Dependence on renal dialysis: Secondary | ICD-10-CM | POA: Diagnosis not present

## 2022-12-28 DIAGNOSIS — M25511 Pain in right shoulder: Secondary | ICD-10-CM

## 2022-12-28 DIAGNOSIS — D631 Anemia in chronic kidney disease: Secondary | ICD-10-CM | POA: Diagnosis not present

## 2022-12-28 DIAGNOSIS — N250 Renal osteodystrophy: Secondary | ICD-10-CM | POA: Diagnosis not present

## 2022-12-28 NOTE — Patient Instructions (Signed)
Physical therapy has been ordered for you at . They should call you to schedule, [phone] is the phone number to call, if you want to call to schedule.   

## 2022-12-28 NOTE — Progress Notes (Signed)
Encounter Diagnoses  Name Primary?   Chronic right shoulder pain    Whiplash injuries, initial encounter    Tendonosis shoulder Yes   Chief Complaint  Patient presents with   Shoulder Pain    Mri results of right shoulder    IMPRESSION: 1. Moderate tendinosis of the supraspinatus tendon with a small insertional interstitial tear anteriorly with a tiny area of bursal surface extension. 2. Severe tendinosis of the infraspinatus tendon with a tiny interstitial tear at the musculotendinous junction. 3. Severe tendinosis of the intra-articular portion of the long head of the biceps tendon. 4. Severe degeneration of the superior labrum with a tear and 17 mm paralabral cyst. 5. Mild osteoarthritis of the right glenohumeral joint.     Electronically Signed   By: Elige Ko M.D.   On: 12/27/2022 14:33  Lamoyne is still having trouble with his shoulder.  His MRI is listed above in terms of the report I did look at it there is no repairable cuff tear  Recommend physical therapy come back in 7 weeks

## 2022-12-29 DIAGNOSIS — N186 End stage renal disease: Secondary | ICD-10-CM | POA: Diagnosis not present

## 2022-12-29 DIAGNOSIS — D509 Iron deficiency anemia, unspecified: Secondary | ICD-10-CM | POA: Diagnosis not present

## 2022-12-29 DIAGNOSIS — Z992 Dependence on renal dialysis: Secondary | ICD-10-CM | POA: Diagnosis not present

## 2022-12-29 DIAGNOSIS — D631 Anemia in chronic kidney disease: Secondary | ICD-10-CM | POA: Diagnosis not present

## 2022-12-29 DIAGNOSIS — N250 Renal osteodystrophy: Secondary | ICD-10-CM | POA: Diagnosis not present

## 2022-12-30 DIAGNOSIS — D631 Anemia in chronic kidney disease: Secondary | ICD-10-CM | POA: Diagnosis not present

## 2022-12-30 DIAGNOSIS — N186 End stage renal disease: Secondary | ICD-10-CM | POA: Diagnosis not present

## 2022-12-30 DIAGNOSIS — Z992 Dependence on renal dialysis: Secondary | ICD-10-CM | POA: Diagnosis not present

## 2022-12-30 DIAGNOSIS — D509 Iron deficiency anemia, unspecified: Secondary | ICD-10-CM | POA: Diagnosis not present

## 2022-12-30 DIAGNOSIS — N250 Renal osteodystrophy: Secondary | ICD-10-CM | POA: Diagnosis not present

## 2022-12-31 DIAGNOSIS — D631 Anemia in chronic kidney disease: Secondary | ICD-10-CM | POA: Diagnosis not present

## 2022-12-31 DIAGNOSIS — Z992 Dependence on renal dialysis: Secondary | ICD-10-CM | POA: Diagnosis not present

## 2022-12-31 DIAGNOSIS — D509 Iron deficiency anemia, unspecified: Secondary | ICD-10-CM | POA: Diagnosis not present

## 2022-12-31 DIAGNOSIS — N186 End stage renal disease: Secondary | ICD-10-CM | POA: Diagnosis not present

## 2022-12-31 DIAGNOSIS — N250 Renal osteodystrophy: Secondary | ICD-10-CM | POA: Diagnosis not present

## 2023-01-01 DIAGNOSIS — D631 Anemia in chronic kidney disease: Secondary | ICD-10-CM | POA: Diagnosis not present

## 2023-01-01 DIAGNOSIS — N250 Renal osteodystrophy: Secondary | ICD-10-CM | POA: Diagnosis not present

## 2023-01-01 DIAGNOSIS — D509 Iron deficiency anemia, unspecified: Secondary | ICD-10-CM | POA: Diagnosis not present

## 2023-01-01 DIAGNOSIS — N186 End stage renal disease: Secondary | ICD-10-CM | POA: Diagnosis not present

## 2023-01-01 DIAGNOSIS — Z992 Dependence on renal dialysis: Secondary | ICD-10-CM | POA: Diagnosis not present

## 2023-01-02 DIAGNOSIS — D631 Anemia in chronic kidney disease: Secondary | ICD-10-CM | POA: Diagnosis not present

## 2023-01-02 DIAGNOSIS — D509 Iron deficiency anemia, unspecified: Secondary | ICD-10-CM | POA: Diagnosis not present

## 2023-01-02 DIAGNOSIS — N186 End stage renal disease: Secondary | ICD-10-CM | POA: Diagnosis not present

## 2023-01-02 DIAGNOSIS — N250 Renal osteodystrophy: Secondary | ICD-10-CM | POA: Diagnosis not present

## 2023-01-02 DIAGNOSIS — Z992 Dependence on renal dialysis: Secondary | ICD-10-CM | POA: Diagnosis not present

## 2023-01-03 ENCOUNTER — Encounter (HOSPITAL_COMMUNITY)
Admission: RE | Admit: 2023-01-03 | Discharge: 2023-01-03 | Disposition: A | Discharge status: Home / Self Care | Payer: Medicare Other | Source: Ambulatory Visit | Attending: Internal Medicine | Admitting: Internal Medicine

## 2023-01-03 VITALS — BP 118/84 | HR 56

## 2023-01-03 DIAGNOSIS — Z992 Dependence on renal dialysis: Secondary | ICD-10-CM | POA: Diagnosis not present

## 2023-01-03 DIAGNOSIS — I214 Non-ST elevation (NSTEMI) myocardial infarction: Secondary | ICD-10-CM | POA: Insufficient documentation

## 2023-01-03 DIAGNOSIS — N250 Renal osteodystrophy: Secondary | ICD-10-CM | POA: Diagnosis not present

## 2023-01-03 DIAGNOSIS — D631 Anemia in chronic kidney disease: Secondary | ICD-10-CM | POA: Diagnosis not present

## 2023-01-03 DIAGNOSIS — N186 End stage renal disease: Secondary | ICD-10-CM | POA: Diagnosis not present

## 2023-01-03 DIAGNOSIS — Z951 Presence of aortocoronary bypass graft: Secondary | ICD-10-CM | POA: Insufficient documentation

## 2023-01-03 DIAGNOSIS — D509 Iron deficiency anemia, unspecified: Secondary | ICD-10-CM | POA: Diagnosis not present

## 2023-01-03 LAB — GLUCOSE, CAPILLARY: Glucose-Capillary: 102 mg/dL — ABNORMAL HIGH (ref 70–99)
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
## 2023-01-03 NOTE — Progress Notes (Signed)
..  Cardiac Rehab Medication Review by a Nurse   Does the patient  feel that his/her medications are working for him/her?  Yes   Has the patient been experiencing any side effects to the medications prescribed?  No   Does the patient measure his/her own blood pressure or blood glucose at home?  Yes he checks both.   Does the patient have any problems obtaining medications due to transportation or finances?   No   Understanding of regimen: good Understanding of indications: good Potential of compliance: good      Nurse comments:   Reviewed pt's medications and allergies during orientation.

## 2023-01-04 DIAGNOSIS — D631 Anemia in chronic kidney disease: Secondary | ICD-10-CM | POA: Diagnosis not present

## 2023-01-04 DIAGNOSIS — Z992 Dependence on renal dialysis: Secondary | ICD-10-CM | POA: Diagnosis not present

## 2023-01-04 DIAGNOSIS — D509 Iron deficiency anemia, unspecified: Secondary | ICD-10-CM | POA: Diagnosis not present

## 2023-01-04 DIAGNOSIS — N250 Renal osteodystrophy: Secondary | ICD-10-CM | POA: Diagnosis not present

## 2023-01-04 DIAGNOSIS — N186 End stage renal disease: Secondary | ICD-10-CM | POA: Diagnosis not present

## 2023-01-05 DIAGNOSIS — N186 End stage renal disease: Secondary | ICD-10-CM | POA: Diagnosis not present

## 2023-01-05 DIAGNOSIS — D631 Anemia in chronic kidney disease: Secondary | ICD-10-CM | POA: Diagnosis not present

## 2023-01-05 DIAGNOSIS — N250 Renal osteodystrophy: Secondary | ICD-10-CM | POA: Diagnosis not present

## 2023-01-05 DIAGNOSIS — Z992 Dependence on renal dialysis: Secondary | ICD-10-CM | POA: Diagnosis not present

## 2023-01-05 DIAGNOSIS — D509 Iron deficiency anemia, unspecified: Secondary | ICD-10-CM | POA: Diagnosis not present

## 2023-01-06 ENCOUNTER — Other Ambulatory Visit: Payer: Self-pay | Admitting: Emergency Medicine

## 2023-01-06 DIAGNOSIS — N250 Renal osteodystrophy: Secondary | ICD-10-CM | POA: Diagnosis not present

## 2023-01-06 DIAGNOSIS — D631 Anemia in chronic kidney disease: Secondary | ICD-10-CM | POA: Diagnosis not present

## 2023-01-06 DIAGNOSIS — Z992 Dependence on renal dialysis: Secondary | ICD-10-CM | POA: Diagnosis not present

## 2023-01-06 DIAGNOSIS — D509 Iron deficiency anemia, unspecified: Secondary | ICD-10-CM | POA: Diagnosis not present

## 2023-01-06 DIAGNOSIS — N186 End stage renal disease: Secondary | ICD-10-CM | POA: Diagnosis not present

## 2023-01-07 DIAGNOSIS — Z992 Dependence on renal dialysis: Secondary | ICD-10-CM | POA: Diagnosis not present

## 2023-01-07 DIAGNOSIS — D509 Iron deficiency anemia, unspecified: Secondary | ICD-10-CM | POA: Diagnosis not present

## 2023-01-07 DIAGNOSIS — N186 End stage renal disease: Secondary | ICD-10-CM | POA: Diagnosis not present

## 2023-01-07 DIAGNOSIS — D631 Anemia in chronic kidney disease: Secondary | ICD-10-CM | POA: Diagnosis not present

## 2023-01-07 DIAGNOSIS — N250 Renal osteodystrophy: Secondary | ICD-10-CM | POA: Diagnosis not present

## 2023-01-08 ENCOUNTER — Encounter (HOSPITAL_COMMUNITY)
Admission: RE | Admit: 2023-01-08 | Discharge: 2023-01-08 | Disposition: A | Discharge status: Home / Self Care | Payer: Medicare Other | Source: Ambulatory Visit | Attending: Internal Medicine | Admitting: Internal Medicine

## 2023-01-08 VITALS — Ht 66.0 in | Wt 221.3 lb

## 2023-01-08 DIAGNOSIS — N186 End stage renal disease: Secondary | ICD-10-CM | POA: Diagnosis not present

## 2023-01-08 DIAGNOSIS — I214 Non-ST elevation (NSTEMI) myocardial infarction: Secondary | ICD-10-CM | POA: Diagnosis not present

## 2023-01-08 DIAGNOSIS — N250 Renal osteodystrophy: Secondary | ICD-10-CM | POA: Diagnosis not present

## 2023-01-08 DIAGNOSIS — Z992 Dependence on renal dialysis: Secondary | ICD-10-CM | POA: Diagnosis not present

## 2023-01-08 DIAGNOSIS — D509 Iron deficiency anemia, unspecified: Secondary | ICD-10-CM | POA: Diagnosis not present

## 2023-01-08 DIAGNOSIS — Z951 Presence of aortocoronary bypass graft: Secondary | ICD-10-CM | POA: Diagnosis not present

## 2023-01-08 DIAGNOSIS — D631 Anemia in chronic kidney disease: Secondary | ICD-10-CM | POA: Diagnosis not present

## 2023-01-08 LAB — GLUCOSE, CAPILLARY: Glucose-Capillary: 147 mg/dL — ABNORMAL HIGH (ref 70–99)

## 2023-01-08 NOTE — Progress Notes (Signed)
Daily Session Note  Patient Details  Name: [name] [name] MRN: [id number] Date of Birth: [birth year] Referring Provider:   Flowsheet Row CARDIAC REHAB PHASE II ORIENTATION from 08/08/2022 in [facility]  Referring Provider Dr. Cliffton Asters       Encounter Date: 01/08/2023  Check In:  Session Check In - 01/08/23 1100       Check-In   Supervising physician immediately available to respond to emergencies [facility] MD immediately available    Physician(s) Dr. Lia Hopping    Location [facility]    Staff Present Ross Ludwig, BS, Exercise Physiologist;Daphyne Daphine Deutscher, RN, BSN    Virtual Visit No    Medication changes reported     No    Fall or balance concerns reported    Yes    Comments Pt has had 2 falls with no injury, and he uses a cane as needed due to poor balance.    Tobacco Cessation No Change    Warm-up and Cool-down Performed as group-led instruction    Resistance Training Performed Yes    VAD Patient? No    PAD/SET Patient? No      Pain Assessment   Currently in Pain? No/denies    Pain Score 0-No pain    Multiple Pain Sites No             Capillary Blood Glucose: Results for orders placed or performed during the hospital encounter of 01/03/23 (from the past 24 hour(s))  Glucose, capillary     Status: Abnormal   Collection Time: 01/08/23 10:48 AM  Result Value Ref Range   Glucose-Capillary 147 (H) 70 - 99 mg/dL      Social History   Tobacco Use  Smoking Status Former   Packs/day: 1.00   Years: 25.00   Additional pack years: 0.00   Total pack years: 25.00   Types: Cigarettes   Quit date: 03/27/2010   Years since quitting: 12.7  Smokeless Tobacco Never  Tobacco Comments   Quit x 7 years    Goals Met:  Independence with exercise equipment Exercise tolerated well No report of concerns or symptoms today Strength training completed today  Goals Unmet:  Not Applicable  Comments: check out 1200   Dr. Dina Rich is Medical Director for [facility] Cardiac Rehab

## 2023-01-09 DIAGNOSIS — D631 Anemia in chronic kidney disease: Secondary | ICD-10-CM | POA: Diagnosis not present

## 2023-01-09 DIAGNOSIS — Z992 Dependence on renal dialysis: Secondary | ICD-10-CM | POA: Diagnosis not present

## 2023-01-09 DIAGNOSIS — N186 End stage renal disease: Secondary | ICD-10-CM | POA: Diagnosis not present

## 2023-01-09 DIAGNOSIS — N250 Renal osteodystrophy: Secondary | ICD-10-CM | POA: Diagnosis not present

## 2023-01-09 DIAGNOSIS — D509 Iron deficiency anemia, unspecified: Secondary | ICD-10-CM | POA: Diagnosis not present

## 2023-01-10 ENCOUNTER — Encounter (HOSPITAL_COMMUNITY)
Admission: RE | Admit: 2023-01-10 | Discharge: 2023-01-10 | Disposition: A | Discharge status: Home / Self Care | Payer: Medicare Other | Source: Ambulatory Visit | Attending: Internal Medicine | Admitting: Internal Medicine

## 2023-01-10 DIAGNOSIS — Z951 Presence of aortocoronary bypass graft: Secondary | ICD-10-CM | POA: Diagnosis not present

## 2023-01-10 DIAGNOSIS — D631 Anemia in chronic kidney disease: Secondary | ICD-10-CM | POA: Diagnosis not present

## 2023-01-10 DIAGNOSIS — N250 Renal osteodystrophy: Secondary | ICD-10-CM | POA: Diagnosis not present

## 2023-01-10 DIAGNOSIS — I214 Non-ST elevation (NSTEMI) myocardial infarction: Secondary | ICD-10-CM

## 2023-01-10 DIAGNOSIS — N186 End stage renal disease: Secondary | ICD-10-CM | POA: Diagnosis not present

## 2023-01-10 DIAGNOSIS — D509 Iron deficiency anemia, unspecified: Secondary | ICD-10-CM | POA: Diagnosis not present

## 2023-01-10 DIAGNOSIS — Z992 Dependence on renal dialysis: Secondary | ICD-10-CM | POA: Diagnosis not present

## 2023-01-10 LAB — GLUCOSE, CAPILLARY: Glucose-Capillary: 119 mg/dL — ABNORMAL HIGH (ref 70–99)

## 2023-01-10 NOTE — Progress Notes (Signed)
Daily Session Note  Patient Details  Name: [name] [name] MRN: [id number] Date of Birth: 07-Nov-1950 Referring Provider:   Flowsheet Row CARDIAC REHAB PHASE II EXERCISE from 01/08/2023 in [facility] CARDIAC REHABILITATION  Referring Provider Dr. Rennis Golden       Encounter Date: 01/10/2023  Check In:  Session Check In - 01/10/23 1100       Check-In   Supervising physician immediately available to respond to emergencies [facility] MD immediately available    Physician(s) Dr. Lia Hopping    Location [facility]    Staff Present Ross Ludwig, BS, Exercise Physiologist;Rilya Longo BSN, RN;Debra Laural Benes, RN, BSN    Virtual Visit No    Medication changes reported     No    Fall or balance concerns reported    Yes    Comments Pt has had 2 falls with no injury, and he uses a cane as needed due to poor balance.    Tobacco Cessation No Change    Warm-up and Cool-down Performed as group-led instruction    Resistance Training Performed Yes    VAD Patient? No    PAD/SET Patient? No      Pain Assessment   Currently in Pain? Yes    Pain Score 5     Pain Location Shoulder    Pain Orientation Right    Pain Descriptors / Indicators Aching    Pain Type Chronic pain    Pain Onset More than a month ago    Pain Frequency Constant    Multiple Pain Sites No             Capillary Blood Glucose: Results for orders placed or performed during the hospital encounter of 01/08/23 (from the past 24 hour(s))  Glucose, capillary     Status: Abnormal   Collection Time: 01/10/23 10:47 AM  Result Value Ref Range   Glucose-Capillary 119 (H) 70 - 99 mg/dL      Social History   Tobacco Use  Smoking Status Former   Packs/day: 1.00   Years: 25.00   Additional pack years: 0.00   Total pack years: 25.00   Types: Cigarettes   Quit date: 03/27/2010   Years since quitting: 12.8  Smokeless Tobacco Never  Tobacco Comments   Quit x 7 years    Goals Met:  Independence with exercise  equipment Exercise tolerated well No report of concerns or symptoms today Strength training completed today  Goals Unmet:  Not Applicable  Comments: check out at 12:00   Dr. Dina Rich is Medical Director for [facility] Card[facility]

## 2023-01-11 ENCOUNTER — Encounter (HOSPITAL_COMMUNITY): Payer: Self-pay | Admitting: Occupational Therapy

## 2023-01-11 ENCOUNTER — Ambulatory Visit (HOSPITAL_COMMUNITY): Payer: Medicare Other | Attending: Orthopedic Surgery | Admitting: Occupational Therapy

## 2023-01-11 DIAGNOSIS — M25611 Stiffness of right shoulder, not elsewhere classified: Secondary | ICD-10-CM | POA: Insufficient documentation

## 2023-01-11 DIAGNOSIS — N250 Renal osteodystrophy: Secondary | ICD-10-CM | POA: Diagnosis not present

## 2023-01-11 DIAGNOSIS — D509 Iron deficiency anemia, unspecified: Secondary | ICD-10-CM | POA: Diagnosis not present

## 2023-01-11 DIAGNOSIS — M25511 Pain in right shoulder: Secondary | ICD-10-CM | POA: Diagnosis not present

## 2023-01-11 DIAGNOSIS — G8929 Other chronic pain: Secondary | ICD-10-CM | POA: Insufficient documentation

## 2023-01-11 DIAGNOSIS — M6780 Other specified disorders of synovium and tendon, unspecified site: Secondary | ICD-10-CM | POA: Insufficient documentation

## 2023-01-11 DIAGNOSIS — N186 End stage renal disease: Secondary | ICD-10-CM | POA: Diagnosis not present

## 2023-01-11 DIAGNOSIS — D631 Anemia in chronic kidney disease: Secondary | ICD-10-CM | POA: Diagnosis not present

## 2023-01-11 DIAGNOSIS — R29898 Other symptoms and signs involving the musculoskeletal system: Secondary | ICD-10-CM | POA: Insufficient documentation

## 2023-01-11 DIAGNOSIS — Z992 Dependence on renal dialysis: Secondary | ICD-10-CM | POA: Diagnosis not present
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
## 2023-01-12 ENCOUNTER — Encounter (HOSPITAL_COMMUNITY): Payer: Medicare Other

## 2023-01-12 DIAGNOSIS — D509 Iron deficiency anemia, unspecified: Secondary | ICD-10-CM | POA: Diagnosis not present

## 2023-01-12 DIAGNOSIS — Z992 Dependence on renal dialysis: Secondary | ICD-10-CM | POA: Diagnosis not present

## 2023-01-12 DIAGNOSIS — I25810 Atherosclerosis of coronary artery bypass graft(s) without angina pectoris: Secondary | ICD-10-CM | POA: Diagnosis not present

## 2023-01-12 DIAGNOSIS — E1165 Type 2 diabetes mellitus with hyperglycemia: Secondary | ICD-10-CM | POA: Diagnosis not present

## 2023-01-12 DIAGNOSIS — I10 Essential (primary) hypertension: Secondary | ICD-10-CM | POA: Diagnosis not present

## 2023-01-12 DIAGNOSIS — D631 Anemia in chronic kidney disease: Secondary | ICD-10-CM | POA: Diagnosis not present

## 2023-01-12 DIAGNOSIS — N250 Renal osteodystrophy: Secondary | ICD-10-CM | POA: Diagnosis not present

## 2023-01-12 DIAGNOSIS — N186 End stage renal disease: Secondary | ICD-10-CM | POA: Diagnosis not present

## 2023-01-13 DIAGNOSIS — D631 Anemia in chronic kidney disease: Secondary | ICD-10-CM | POA: Diagnosis not present

## 2023-01-13 DIAGNOSIS — D509 Iron deficiency anemia, unspecified: Secondary | ICD-10-CM | POA: Diagnosis not present

## 2023-01-13 DIAGNOSIS — Z992 Dependence on renal dialysis: Secondary | ICD-10-CM | POA: Diagnosis not present

## 2023-01-13 DIAGNOSIS — N186 End stage renal disease: Secondary | ICD-10-CM | POA: Diagnosis not present

## 2023-01-13 DIAGNOSIS — N250 Renal osteodystrophy: Secondary | ICD-10-CM | POA: Diagnosis not present

## 2023-01-14 DIAGNOSIS — Z992 Dependence on renal dialysis: Secondary | ICD-10-CM | POA: Diagnosis not present

## 2023-01-14 DIAGNOSIS — N250 Renal osteodystrophy: Secondary | ICD-10-CM | POA: Diagnosis not present

## 2023-01-14 DIAGNOSIS — N186 End stage renal disease: Secondary | ICD-10-CM | POA: Diagnosis not present

## 2023-01-14 DIAGNOSIS — D631 Anemia in chronic kidney disease: Secondary | ICD-10-CM | POA: Diagnosis not present

## 2023-01-14 DIAGNOSIS — D509 Iron deficiency anemia, unspecified: Secondary | ICD-10-CM | POA: Diagnosis not present

## 2023-01-15 ENCOUNTER — Encounter (HOSPITAL_COMMUNITY)
Admission: RE | Admit: 2023-01-15 | Discharge: 2023-01-15 | Disposition: A | Discharge status: Home / Self Care | Payer: Medicare Other | Source: Ambulatory Visit | Attending: Internal Medicine | Admitting: Internal Medicine

## 2023-01-15 DIAGNOSIS — Z951 Presence of aortocoronary bypass graft: Secondary | ICD-10-CM | POA: Insufficient documentation

## 2023-01-15 DIAGNOSIS — N186 End stage renal disease: Secondary | ICD-10-CM | POA: Diagnosis not present

## 2023-01-15 DIAGNOSIS — D631 Anemia in chronic kidney disease: Secondary | ICD-10-CM | POA: Diagnosis not present

## 2023-01-15 DIAGNOSIS — D509 Iron deficiency anemia, unspecified: Secondary | ICD-10-CM | POA: Diagnosis not present

## 2023-01-15 DIAGNOSIS — I214 Non-ST elevation (NSTEMI) myocardial infarction: Secondary | ICD-10-CM | POA: Diagnosis not present

## 2023-01-15 DIAGNOSIS — N250 Renal osteodystrophy: Secondary | ICD-10-CM | POA: Diagnosis not present

## 2023-01-15 DIAGNOSIS — Z992 Dependence on renal dialysis: Secondary | ICD-10-CM | POA: Diagnosis not present

## 2023-01-15 LAB — GLUCOSE, CAPILLARY: Glucose-Capillary: 119 mg/dL — ABNORMAL HIGH (ref 70–99)

## 2023-01-15 NOTE — Progress Notes (Signed)
Daily Session Note  Patient Details  Name: [name] [name] MRN: [id number] Date of Birth: [birth year] Referring Provider:   Flowsheet Row CARDIAC REHAB PHASE II EXERCISE from 01/08/2023 in [facility] CARDIAC REHABILITATION  Referring Provider Dr. Rennis Golden       Encounter Date: 01/15/2023  Check In:  Session Check In - 01/15/23 1100       Check-In   Supervising physician immediately available to respond to emergencies [facility] MD immediately available    Physician(s) Dr. Wyline Mood    Location [facility]    Staff Present Ross Ludwig, BS, Exercise Physiologist;Debra Laural Benes, RN, BSN    Virtual Visit No    Medication changes reported     No    Fall or balance concerns reported    Yes    Comments Pt has had 2 falls with no injury, and he uses a cane as needed due to poor balance.    Tobacco Cessation No Change    Warm-up and Cool-down Performed as group-led instruction    Resistance Training Performed Yes    VAD Patient? No    PAD/SET Patient? No      Pain Assessment   Currently in Pain? No/denies    Pain Score 5     Pain Location Shoulder    Pain Orientation Right    Pain Descriptors / Indicators Aching    Pain Type Chronic pain    Pain Onset More than a month ago    Pain Frequency Constant    Multiple Pain Sites No             Capillary Blood Glucose: No results found for this or any previous visit (from the past 24 hour(s)).    Social History   Tobacco Use  Smoking Status Former   Packs/day: 1.00   Years: 25.00   Additional pack years: 0.00   Total pack years: 25.00   Types: Cigarettes   Quit date: 03/27/2010   Years since quitting: 12.8  Smokeless Tobacco Never  Tobacco Comments   Quit x 7 years    Goals Met:  Independence with exercise equipment Exercise tolerated well No report of concerns or symptoms today Strength training completed today  Goals Unmet:  Not Applicable  Comments: check out 1200   Dr. Dina Rich is Medical  Director for [facility] Cardiac Rehab

## 2023-01-16 ENCOUNTER — Ambulatory Visit: Payer: Medicare Other | Admitting: Nurse Practitioner

## 2023-01-16 DIAGNOSIS — D631 Anemia in chronic kidney disease: Secondary | ICD-10-CM | POA: Diagnosis not present

## 2023-01-16 DIAGNOSIS — N250 Renal osteodystrophy: Secondary | ICD-10-CM | POA: Diagnosis not present

## 2023-01-16 DIAGNOSIS — N186 End stage renal disease: Secondary | ICD-10-CM | POA: Diagnosis not present

## 2023-01-16 DIAGNOSIS — Z992 Dependence on renal dialysis: Secondary | ICD-10-CM | POA: Diagnosis not present

## 2023-01-16 DIAGNOSIS — D509 Iron deficiency anemia, unspecified: Secondary | ICD-10-CM | POA: Diagnosis not present

## 2023-01-17 ENCOUNTER — Encounter (HOSPITAL_COMMUNITY)
Admission: RE | Admit: 2023-01-17 | Discharge: 2023-01-17 | Disposition: A | Discharge status: Home / Self Care | Payer: Medicare Other | Source: Ambulatory Visit | Attending: Internal Medicine | Admitting: Internal Medicine

## 2023-01-17 DIAGNOSIS — Z951 Presence of aortocoronary bypass graft: Secondary | ICD-10-CM | POA: Diagnosis not present

## 2023-01-17 DIAGNOSIS — I214 Non-ST elevation (NSTEMI) myocardial infarction: Secondary | ICD-10-CM | POA: Diagnosis not present

## 2023-01-17 DIAGNOSIS — D631 Anemia in chronic kidney disease: Secondary | ICD-10-CM | POA: Diagnosis not present

## 2023-01-17 DIAGNOSIS — Z992 Dependence on renal dialysis: Secondary | ICD-10-CM | POA: Diagnosis not present

## 2023-01-17 DIAGNOSIS — N186 End stage renal disease: Secondary | ICD-10-CM | POA: Diagnosis not present

## 2023-01-17 DIAGNOSIS — N250 Renal osteodystrophy: Secondary | ICD-10-CM | POA: Diagnosis not present

## 2023-01-17 DIAGNOSIS — D509 Iron deficiency anemia, unspecified: Secondary | ICD-10-CM | POA: Diagnosis not present

## 2023-01-17 NOTE — Progress Notes (Signed)
Daily Session Note  Patient Details  Name: [name] [name] MRN: [id number] Date of Birth: [birth year] Referring Provider:   Flowsheet Row CARDIAC REHAB PHASE II EXERCISE from 01/08/2023 in [facility] CARDIAC REHABILITATION  Referring Provider Dr. Rennis Golden       Encounter Date: 01/17/2023  Check In:  Session Check In - 01/17/23 1104       Check-In   Supervising physician immediately available to respond to emergencies [facility] MD immediately available    Physician(s) Dr. Wyline Mood    Location [facility]    Staff Present Ross Ludwig, BS, Exercise Physiologist;Hillary Troutman BSN, RN    Virtual Visit No    Medication changes reported     No    Fall or balance concerns reported    Yes    Comments Pt has had 2 falls with no injury, and he uses a cane as needed due to poor balance.    Tobacco Cessation No Change    Warm-up and Cool-down Performed as group-led instruction    Resistance Training Performed Yes    VAD Patient? No    PAD/SET Patient? No      Pain Assessment   Currently in Pain? No/denies    Pain Score 0-No pain    Multiple Pain Sites No             Capillary Blood Glucose: No results found for this or any previous visit (from the past 24 hour(s)).    Social History   Tobacco Use  Smoking Status Former   Packs/day: 1.00   Years: 25.00   Additional pack years: 0.00   Total pack years: 25.00   Types: Cigarettes   Quit date: 03/27/2010   Years since quitting: 12.8  Smokeless Tobacco Never  Tobacco Comments   Quit x 7 years    Goals Met:  Independence with exercise equipment Exercise tolerated well No report of concerns or symptoms today Strength training completed today  Goals Unmet:  Not Applicable  Comments: check out 1200   Dr. Dina Rich is Medical Director for [facility] Cardiac Rehab

## 2023-01-18 DIAGNOSIS — N250 Renal osteodystrophy: Secondary | ICD-10-CM | POA: Diagnosis not present

## 2023-01-18 DIAGNOSIS — D631 Anemia in chronic kidney disease: Secondary | ICD-10-CM | POA: Diagnosis not present

## 2023-01-18 DIAGNOSIS — D509 Iron deficiency anemia, unspecified: Secondary | ICD-10-CM | POA: Diagnosis not present

## 2023-01-18 DIAGNOSIS — Z992 Dependence on renal dialysis: Secondary | ICD-10-CM | POA: Diagnosis not present

## 2023-01-18 DIAGNOSIS — N186 End stage renal disease: Secondary | ICD-10-CM | POA: Diagnosis not present

## 2023-01-19 ENCOUNTER — Encounter (HOSPITAL_COMMUNITY)
Admission: RE | Admit: 2023-01-19 | Discharge: 2023-01-19 | Disposition: A | Discharge status: Home / Self Care | Payer: Medicare Other | Source: Ambulatory Visit | Attending: Internal Medicine | Admitting: Internal Medicine

## 2023-01-19 DIAGNOSIS — D509 Iron deficiency anemia, unspecified: Secondary | ICD-10-CM | POA: Diagnosis not present

## 2023-01-19 DIAGNOSIS — Z992 Dependence on renal dialysis: Secondary | ICD-10-CM | POA: Diagnosis not present

## 2023-01-19 DIAGNOSIS — D631 Anemia in chronic kidney disease: Secondary | ICD-10-CM | POA: Diagnosis not present

## 2023-01-19 DIAGNOSIS — I214 Non-ST elevation (NSTEMI) myocardial infarction: Secondary | ICD-10-CM | POA: Diagnosis not present

## 2023-01-19 DIAGNOSIS — Z951 Presence of aortocoronary bypass graft: Secondary | ICD-10-CM

## 2023-01-19 DIAGNOSIS — N186 End stage renal disease: Secondary | ICD-10-CM | POA: Diagnosis not present

## 2023-01-19 DIAGNOSIS — N250 Renal osteodystrophy: Secondary | ICD-10-CM | POA: Diagnosis not present

## 2023-01-19 NOTE — Progress Notes (Signed)
Daily Session Note  Patient Details  Name: [name] [name] MRN: [id number] Date of Birth: [birth year] Referring Provider:   Flowsheet Row CARDIAC REHAB PHASE II EXERCISE from 01/08/2023 in [facility] CARDIAC REHABILITATION  Referring Provider Dr. Rennis Golden       Encounter Date: 01/19/2023  Check In:  Session Check In - 01/19/23 1100       Check-In   Supervising physician immediately available to respond to emergencies [facility] MD immediately available    Physician(s) Dr. Diona Browner    Location [facility]    Staff Present Ross Ludwig, BS, Exercise Physiologist;Jarryn Altland Laural Benes, RN, BSN    Virtual Visit No    Medication changes reported     No    Fall or balance concerns reported    Yes    Comments Pt has had 2 falls with no injury, and he uses a cane as needed due to poor balance.    Tobacco Cessation No Change    Warm-up and Cool-down Performed as group-led instruction    Resistance Training Performed Yes    VAD Patient? No    PAD/SET Patient? No      Pain Assessment   Currently in Pain? No/denies    Pain Score 0-No pain    Multiple Pain Sites No             Capillary Blood Glucose: No results found for this or any previous visit (from the past 24 hour(s)).    Social History   Tobacco Use  Smoking Status Former   Packs/day: 1.00   Years: 25.00   Additional pack years: 0.00   Total pack years: 25.00   Types: Cigarettes   Quit date: 03/27/2010   Years since quitting: 12.8  Smokeless Tobacco Never  Tobacco Comments   Quit x 7 years    Goals Met:  Independence with exercise equipment Exercise tolerated well No report of concerns or symptoms today Strength training completed today  Goals Unmet:  Not Applicable  Comments: Check out 1200.   Dr. Dina Rich is Medical Director for [facility] Cardiac Rehab

## 2023-01-20 DIAGNOSIS — N186 End stage renal disease: Secondary | ICD-10-CM | POA: Diagnosis not present

## 2023-01-20 DIAGNOSIS — Z992 Dependence on renal dialysis: Secondary | ICD-10-CM | POA: Diagnosis not present

## 2023-01-20 DIAGNOSIS — D631 Anemia in chronic kidney disease: Secondary | ICD-10-CM | POA: Diagnosis not present

## 2023-01-20 DIAGNOSIS — D509 Iron deficiency anemia, unspecified: Secondary | ICD-10-CM | POA: Diagnosis not present

## 2023-01-20 DIAGNOSIS — N250 Renal osteodystrophy: Secondary | ICD-10-CM | POA: Diagnosis not present

## 2023-01-21 DIAGNOSIS — N250 Renal osteodystrophy: Secondary | ICD-10-CM | POA: Diagnosis not present

## 2023-01-21 DIAGNOSIS — Z992 Dependence on renal dialysis: Secondary | ICD-10-CM | POA: Diagnosis not present

## 2023-01-21 DIAGNOSIS — D631 Anemia in chronic kidney disease: Secondary | ICD-10-CM | POA: Diagnosis not present

## 2023-01-21 DIAGNOSIS — N186 End stage renal disease: Secondary | ICD-10-CM | POA: Diagnosis not present

## 2023-01-21 DIAGNOSIS — D509 Iron deficiency anemia, unspecified: Secondary | ICD-10-CM | POA: Diagnosis not present

## 2023-01-22 ENCOUNTER — Encounter (HOSPITAL_COMMUNITY): Payer: Medicare Other

## 2023-01-22 DIAGNOSIS — N250 Renal osteodystrophy: Secondary | ICD-10-CM | POA: Diagnosis not present

## 2023-01-22 DIAGNOSIS — D509 Iron deficiency anemia, unspecified: Secondary | ICD-10-CM | POA: Diagnosis not present

## 2023-01-22 DIAGNOSIS — Z992 Dependence on renal dialysis: Secondary | ICD-10-CM | POA: Diagnosis not present

## 2023-01-22 DIAGNOSIS — D631 Anemia in chronic kidney disease: Secondary | ICD-10-CM | POA: Diagnosis not present

## 2023-01-22 DIAGNOSIS — N186 End stage renal disease: Secondary | ICD-10-CM | POA: Diagnosis not present

## 2023-01-23 DIAGNOSIS — N186 End stage renal disease: Secondary | ICD-10-CM | POA: Diagnosis not present

## 2023-01-23 DIAGNOSIS — D631 Anemia in chronic kidney disease: Secondary | ICD-10-CM | POA: Diagnosis not present

## 2023-01-23 DIAGNOSIS — Z794 Long term (current) use of insulin: Secondary | ICD-10-CM | POA: Diagnosis not present

## 2023-01-23 DIAGNOSIS — Z992 Dependence on renal dialysis: Secondary | ICD-10-CM | POA: Diagnosis not present

## 2023-01-23 DIAGNOSIS — E119 Type 2 diabetes mellitus without complications: Secondary | ICD-10-CM | POA: Diagnosis not present

## 2023-01-23 DIAGNOSIS — D509 Iron deficiency anemia, unspecified: Secondary | ICD-10-CM | POA: Diagnosis not present

## 2023-01-23 DIAGNOSIS — N250 Renal osteodystrophy: Secondary | ICD-10-CM | POA: Diagnosis not present

## 2023-01-24 ENCOUNTER — Encounter (HOSPITAL_COMMUNITY): Payer: Medicare Other

## 2023-01-24 DIAGNOSIS — N250 Renal osteodystrophy: Secondary | ICD-10-CM | POA: Diagnosis not present

## 2023-01-24 DIAGNOSIS — D631 Anemia in chronic kidney disease: Secondary | ICD-10-CM | POA: Diagnosis not present

## 2023-01-24 DIAGNOSIS — Z992 Dependence on renal dialysis: Secondary | ICD-10-CM | POA: Diagnosis not present

## 2023-01-24 DIAGNOSIS — D509 Iron deficiency anemia, unspecified: Secondary | ICD-10-CM | POA: Diagnosis not present

## 2023-01-24 DIAGNOSIS — N186 End stage renal disease: Secondary | ICD-10-CM | POA: Diagnosis not present

## 2023-01-25 DIAGNOSIS — N186 End stage renal disease: Secondary | ICD-10-CM | POA: Diagnosis not present

## 2023-01-25 DIAGNOSIS — N250 Renal osteodystrophy: Secondary | ICD-10-CM | POA: Diagnosis not present

## 2023-01-25 DIAGNOSIS — D509 Iron deficiency anemia, unspecified: Secondary | ICD-10-CM | POA: Diagnosis not present

## 2023-01-25 DIAGNOSIS — D631 Anemia in chronic kidney disease: Secondary | ICD-10-CM | POA: Diagnosis not present

## 2023-01-25 DIAGNOSIS — Z992 Dependence on renal dialysis: Secondary | ICD-10-CM | POA: Diagnosis not present

## 2023-01-26 ENCOUNTER — Encounter (HOSPITAL_COMMUNITY)
Admission: RE | Admit: 2023-01-26 | Discharge: 2023-01-26 | Disposition: A | Discharge status: Home / Self Care | Payer: Medicare Other | Source: Ambulatory Visit | Attending: Internal Medicine | Admitting: Internal Medicine

## 2023-01-26 DIAGNOSIS — Z951 Presence of aortocoronary bypass graft: Secondary | ICD-10-CM | POA: Diagnosis not present

## 2023-01-26 DIAGNOSIS — N186 End stage renal disease: Secondary | ICD-10-CM | POA: Diagnosis not present

## 2023-01-26 DIAGNOSIS — D509 Iron deficiency anemia, unspecified: Secondary | ICD-10-CM | POA: Diagnosis not present

## 2023-01-26 DIAGNOSIS — N250 Renal osteodystrophy: Secondary | ICD-10-CM | POA: Diagnosis not present

## 2023-01-26 DIAGNOSIS — Z992 Dependence on renal dialysis: Secondary | ICD-10-CM | POA: Diagnosis not present

## 2023-01-26 DIAGNOSIS — D631 Anemia in chronic kidney disease: Secondary | ICD-10-CM | POA: Diagnosis not present

## 2023-01-26 DIAGNOSIS — I214 Non-ST elevation (NSTEMI) myocardial infarction: Secondary | ICD-10-CM | POA: Diagnosis not present

## 2023-01-26 NOTE — Progress Notes (Signed)
Daily Session Note  Patient Details  Name: [name] [name] MRN: [id number] Date of Birth: 03/02/1951 Referring Provider:   Flowsheet Row CARDIAC REHAB PHASE II EXERCISE from 01/08/2023 in [facility] CARDIAC REHABILITATION  Referring Provider Dr. Rennis Golden       Encounter Date: 01/26/2023  Check In:  Session Check In - 01/26/23 1056       Check-In   Supervising physician immediately available to respond to emergencies [facility] MD immediately available    Physician(s) Dr. Lequita Asal    Location [facility]    Staff Present Rodena Medin, RN, BSN;Jessica Hawkins, MA, RCEP, CCRP, CCET    Virtual Visit No    Medication changes reported     No    Fall or balance concerns reported    Yes    Comments Pt has had 2 falls with no injury, and he uses a cane as needed due to poor balance.    Warm-up and Cool-down Performed as group-led Writer Performed Yes    VAD Patient? No    PAD/SET Patient? No      Pain Assessment   Currently in Pain? No/denies    Pain Score 0-No pain    Multiple Pain Sites No             Capillary Blood Glucose: No results found for this or any previous visit (from the past 24 hour(s)).    Social History   Tobacco Use  Smoking Status Former   Current packs/day: 0.00   Average packs/day: 1 pack/day for 25.0 years (25.0 ttl pk-yrs)   Types: Cigarettes   Start date: 03/27/1985   Quit date: 03/27/2010   Years since quitting: 12.8  Smokeless Tobacco Never  Tobacco Comments   Quit x 7 years    Goals Met:  Independence with exercise equipment Exercise tolerated well No report of concerns or symptoms today Strength training completed today  Goals Unmet:  Not Applicable  Comments: Pt able to follow exercise prescription today without complaint.  Will continue to monitor for progression.    Dr. Dina Rich is Medical Director for [facility] Cardiac Rehab

## 2023-01-26 NOTE — Progress Notes (Signed)
Reviewed home exercise with pt today.  Pt plans to walk and use seated ellipitcal at home for exercise.  Reviewed THR, pulse, RPE, sign and symptoms, pulse oximetery and when to call 911 or MD.  Also discussed weather considerations and indoor options.  Pt voiced understanding.

## 2023-01-27 DIAGNOSIS — D631 Anemia in chronic kidney disease: Secondary | ICD-10-CM | POA: Diagnosis not present

## 2023-01-27 DIAGNOSIS — N250 Renal osteodystrophy: Secondary | ICD-10-CM | POA: Diagnosis not present

## 2023-01-27 DIAGNOSIS — N186 End stage renal disease: Secondary | ICD-10-CM | POA: Diagnosis not present

## 2023-01-27 DIAGNOSIS — D509 Iron deficiency anemia, unspecified: Secondary | ICD-10-CM | POA: Diagnosis not present

## 2023-01-27 DIAGNOSIS — Z992 Dependence on renal dialysis: Secondary | ICD-10-CM | POA: Diagnosis not present

## 2023-01-28 DIAGNOSIS — N250 Renal osteodystrophy: Secondary | ICD-10-CM | POA: Diagnosis not present

## 2023-01-28 DIAGNOSIS — Z992 Dependence on renal dialysis: Secondary | ICD-10-CM | POA: Diagnosis not present

## 2023-01-28 DIAGNOSIS — D509 Iron deficiency anemia, unspecified: Secondary | ICD-10-CM | POA: Diagnosis not present

## 2023-01-28 DIAGNOSIS — N186 End stage renal disease: Secondary | ICD-10-CM | POA: Diagnosis not present

## 2023-01-28 DIAGNOSIS — D631 Anemia in chronic kidney disease: Secondary | ICD-10-CM | POA: Diagnosis not present

## 2023-01-29 ENCOUNTER — Encounter (HOSPITAL_COMMUNITY)
Admission: RE | Admit: 2023-01-29 | Discharge: 2023-01-29 | Disposition: A | Discharge status: Home / Self Care | Payer: Medicare Other | Source: Ambulatory Visit | Attending: Internal Medicine | Admitting: Internal Medicine

## 2023-01-29 DIAGNOSIS — N250 Renal osteodystrophy: Secondary | ICD-10-CM | POA: Diagnosis not present

## 2023-01-29 DIAGNOSIS — J3089 Other allergic rhinitis: Secondary | ICD-10-CM | POA: Diagnosis not present

## 2023-01-29 DIAGNOSIS — I214 Non-ST elevation (NSTEMI) myocardial infarction: Secondary | ICD-10-CM | POA: Diagnosis not present

## 2023-01-29 DIAGNOSIS — Z951 Presence of aortocoronary bypass graft: Secondary | ICD-10-CM | POA: Diagnosis not present

## 2023-01-29 DIAGNOSIS — N186 End stage renal disease: Secondary | ICD-10-CM | POA: Diagnosis not present

## 2023-01-29 DIAGNOSIS — Z992 Dependence on renal dialysis: Secondary | ICD-10-CM | POA: Diagnosis not present

## 2023-01-29 DIAGNOSIS — H9202 Otalgia, left ear: Secondary | ICD-10-CM | POA: Diagnosis not present

## 2023-01-29 DIAGNOSIS — H6692 Otitis media, unspecified, left ear: Secondary | ICD-10-CM | POA: Diagnosis not present

## 2023-01-29 DIAGNOSIS — D631 Anemia in chronic kidney disease: Secondary | ICD-10-CM | POA: Diagnosis not present

## 2023-01-29 DIAGNOSIS — D509 Iron deficiency anemia, unspecified: Secondary | ICD-10-CM | POA: Diagnosis not present

## 2023-01-29 NOTE — Progress Notes (Signed)
Daily Session Note  Patient Details  Name: [name] [name] MRN: [id number] Date of Birth: 1951-03-26 Referring Provider:   Flowsheet Row CARDIAC REHAB PHASE II EXERCISE from 01/08/2023 in [facility] CARDIAC REHABILITATION  Referring Provider Dr. Rennis Golden       Encounter Date: 01/29/2023  Check In:  Session Check In - 01/29/23 1055       Check-In   Supervising physician immediately available to respond to emergencies [facility] MD immediately available    Physician(s) Dr. Jenene Slicker    Location [facility]    Staff Present Ross Ludwig, BS, Exercise Physiologist;Daphyne Daphine Deutscher, RN, BSN    Virtual Visit No    Medication changes reported     No    Fall or balance concerns reported    Yes    Comments Pt has had 2 falls with no injury, and he uses a cane as needed due to poor balance.    Tobacco Cessation No Change    Warm-up and Cool-down Performed on first and last piece of equipment    Resistance Training Performed Yes    VAD Patient? No    PAD/SET Patient? No      Pain Assessment   Currently in Pain? No/denies    Pain Score 0-No pain    Multiple Pain Sites No             Capillary Blood Glucose: No results found for this or any previous visit (from the past 24 hour(s)).    Social History   Tobacco Use  Smoking Status Former   Current packs/day: 0.00   Average packs/day: 1 pack/day for 25.0 years (25.0 ttl pk-yrs)   Types: Cigarettes   Start date: 03/27/1985   Quit date: 03/27/2010   Years since quitting: 12.8  Smokeless Tobacco Never  Tobacco Comments   Quit x 7 years    Goals Met:  Independence with exercise equipment Exercise tolerated well No report of concerns or symptoms today Strength training completed today  Goals Unmet:  Not Applicable  Comments: Pt able to follow exercise prescription today without complaint.  Will continue to monitor for progression.    Dr. Dina Rich is Medical Director for [facility] Card[facility]

## 2023-01-30 ENCOUNTER — Ambulatory Visit (HOSPITAL_COMMUNITY): Payer: Medicare Other | Attending: Orthopedic Surgery | Admitting: Occupational Therapy

## 2023-01-30 ENCOUNTER — Telehealth (HOSPITAL_COMMUNITY): Payer: Self-pay | Admitting: Occupational Therapy

## 2023-01-30 DIAGNOSIS — D631 Anemia in chronic kidney disease: Secondary | ICD-10-CM | POA: Diagnosis not present

## 2023-01-30 DIAGNOSIS — R29898 Other symptoms and signs involving the musculoskeletal system: Secondary | ICD-10-CM | POA: Insufficient documentation

## 2023-01-30 DIAGNOSIS — N186 End stage renal disease: Secondary | ICD-10-CM | POA: Diagnosis not present

## 2023-01-30 DIAGNOSIS — Z992 Dependence on renal dialysis: Secondary | ICD-10-CM | POA: Diagnosis not present

## 2023-01-30 DIAGNOSIS — M25511 Pain in right shoulder: Secondary | ICD-10-CM | POA: Insufficient documentation

## 2023-01-30 DIAGNOSIS — M25611 Stiffness of right shoulder, not elsewhere classified: Secondary | ICD-10-CM | POA: Insufficient documentation

## 2023-01-30 DIAGNOSIS — G8929 Other chronic pain: Secondary | ICD-10-CM | POA: Insufficient documentation

## 2023-01-30 DIAGNOSIS — N250 Renal osteodystrophy: Secondary | ICD-10-CM | POA: Diagnosis not present

## 2023-01-30 DIAGNOSIS — D509 Iron deficiency anemia, unspecified: Secondary | ICD-10-CM | POA: Diagnosis not present

## 2023-01-30 NOTE — Telephone Encounter (Signed)
No-Show #1  Called pt re: no-show for 7/16 appt. Left vm reminding of next appt on 7/18 at 10:30 and requested return call if unable to keep appt.    Ezra Sites, OTR/L  [phone] 01/30/23

## 2023-01-31 ENCOUNTER — Encounter (HOSPITAL_COMMUNITY): Payer: Self-pay | Admitting: *Deleted

## 2023-01-31 ENCOUNTER — Encounter (HOSPITAL_COMMUNITY)
Admission: RE | Admit: 2023-01-31 | Discharge: 2023-01-31 | Disposition: A | Discharge status: Home / Self Care | Payer: Medicare Other | Source: Ambulatory Visit | Attending: Internal Medicine | Admitting: Internal Medicine

## 2023-01-31 DIAGNOSIS — Z951 Presence of aortocoronary bypass graft: Secondary | ICD-10-CM

## 2023-01-31 DIAGNOSIS — I214 Non-ST elevation (NSTEMI) myocardial infarction: Secondary | ICD-10-CM

## 2023-01-31 DIAGNOSIS — Z992 Dependence on renal dialysis: Secondary | ICD-10-CM | POA: Diagnosis not present

## 2023-01-31 DIAGNOSIS — N186 End stage renal disease: Secondary | ICD-10-CM | POA: Diagnosis not present

## 2023-01-31 DIAGNOSIS — N250 Renal osteodystrophy: Secondary | ICD-10-CM | POA: Diagnosis not present

## 2023-01-31 DIAGNOSIS — D631 Anemia in chronic kidney disease: Secondary | ICD-10-CM | POA: Diagnosis not present

## 2023-01-31 DIAGNOSIS — D509 Iron deficiency anemia, unspecified: Secondary | ICD-10-CM | POA: Diagnosis not present

## 2023-01-31 NOTE — Progress Notes (Signed)
Daily Session Note  Patient Details  Name: [name] [name] MRN: [id number] Date of Birth: May 29, 1951 Referring Provider:   Flowsheet Row CARDIAC REHAB PHASE II EXERCISE from 01/08/2023 in [facility] CARDIAC REHABILIT[facility]  Referring Provider Dr. Rennis Golden       Encounter Date: 01/31/2023  Check In:  Session Check In - 01/31/23 1100       Check-In   Supervising physician immediately available to respond to emergencies [facility] MD immediately available    Physician(s) Dr. Jenene Slicker    Location [facility]    Staff Present Rodena Medin, RN, BSN;Jessica Hawkins, MA, RCEP, CCRP, CCET    Virtual Visit No    Medication changes reported     No    Fall or balance concerns reported    Yes    Comments Pt has had 2 falls with no injury, and he uses a cane as needed due to poor balance.    Tobacco Cessation No Change    Warm-up and Cool-down Performed as group-led instruction    Resistance Training Performed Yes    VAD Patient? No    PAD/SET Patient? No      Pain Assessment   Currently in Pain? No/denies    Pain Score 0-No pain    Multiple Pain Sites No             Capillary Blood Glucose: No results found for this or any previous visit (from the past 24 hour(s)).    Social History   Tobacco Use  Smoking Status Former   Current packs/day: 0.00   Average packs/day: 1 pack/day for 25.0 years (25.0 ttl pk-yrs)   Types: Cigarettes   Start date: 03/27/1985   Quit date: 03/27/2010   Years since quitting: 12.8  Smokeless Tobacco Never  Tobacco Comments   Quit x 7 years    Goals Met:  Independence with exercise equipment Exercise tolerated well No report of concerns or symptoms today Strength training completed today  Goals Unmet:  Not Applicable  Comments: Check out 1200.   Dr. Dina Rich is Medical Director for [facility] Cardiac Rehab

## 2023-01-31 NOTE — Progress Notes (Signed)
Cardiac Individual Treatment Plan  Patient Details  Name: [name] [name] MRN: [id number] Date of Birth: 1950/07/22 Referring Provider:   Flowsheet Row CARDIAC REHAB PHASE II EXERCISE from 01/08/2023 in [facility] CARDIAC REHABILITATION  Referring Provider Dr. Rennis Golden       Initial Encounter Date:  Flowsheet Row CARDIAC REHAB PHASE II EXERCISE from 01/08/2023 in [place] CARDIAC REHABILITATION  Date 01/08/23       Visit Diagnosis: NSTEMI (non-ST elevated myocardial infarction) (HCC)  S/P CABG x 2  Patient's Home Medications on Admission:  Current Outpatient Medications:    albuterol (VENTOLIN HFA) 108 (90 Base) MCG/ACT inhaler, Inhale 2 puffs into the lungs every 6 (six) hours as needed for wheezing or shortness of breath., Disp: 18 g, Rfl: 11   ALPRAZolam (XANAX) 0.25 MG tablet, Take 1 tablet (0.25 mg total) by mouth at bedtime as needed for anxiety. Take before MRI (Patient not taking: Reported on 01/03/2023), Disp: 1 tablet, Rfl: 0   aspirin EC 81 MG tablet, Take 81 mg by mouth daily. Swallow whole., Disp: , Rfl:    atorvastatin (LIPITOR) 80 MG tablet, Take 1 tablet (80 mg total) by mouth daily., Disp: 30 tablet, Rfl: 1   clopidogrel (PLAVIX) 75 MG tablet, Take 1 tablet (75 mg total) by mouth daily. Please take first dose at 9 pm on 12/4, Disp: 30 tablet, Rfl: 1   DROPLET PEN NEEDLES 31G X 5 MM MISC, USE TO INJECT INSULIN DAILY AS DIRECTED, Disp: 100 each, Rfl: 1   esomeprazole (NEXIUM) 40 MG capsule, Take 1 capsule (40 mg total) by mouth 2 (two) times daily before a meal., Disp: 180 capsule, Rfl: 1   fluticasone (FLONASE) 50 MCG/ACT nasal spray, Place 1 spray into both nostrils as needed for allergies or rhinitis., Disp: , Rfl:    gabapentin (NEURONTIN) 100 MG capsule, Take 100 mg by mouth 3 (three) times daily., Disp: , Rfl:    insulin glargine (LANTUS SOLOSTAR) 100 UNIT/ML Solostar Pen, Inject 22 Units into the skin at bedtime., Disp: 18 mL, Rfl: 3   linagliptin (TRADJENTA) 5 MG  TABS tablet, Take 1 tablet (5 mg total) by mouth daily., Disp: 90 tablet, Rfl: 3   midodrine (PROAMATINE) 5 MG tablet, Take 5 mg by mouth 3 (three) times daily with meals., Disp: , Rfl:    ONETOUCH ULTRA test strip, USE TO CHECK BLOOD SUGAR TWICE DAILY, Disp: 200 strip, Rfl: 2   pantoprazole (PROTONIX) 40 MG tablet, Take 40 mg by mouth 2 (two) times daily., Disp: , Rfl:    sevelamer (RENAGEL) 800 MG tablet, Take 2 tablets (1,600 mg total) by mouth 3 (three) times daily with meals., Disp: 180 tablet, Rfl: 1   SYMBICORT 160-4.5 MCG/ACT inhaler, INHALE 2 PUFFS INTO THE LUNGS TWICE DAILY., Disp: 10.2 g, Rfl: 6   Vitamin D, Ergocalciferol, (DRISDOL) 1.25 MG (50000 UNIT) CAPS capsule, Take 50,000 Units by mouth every 7 (seven) days. Monday, Disp: , Rfl:   Past Medical History: Past Medical History:  Diagnosis Date   Anemia    Arthritis    Asthma    Bell palsy    CAD (coronary artery disease)    a. 2014 MV: abnl w/ infap ischemia; b. 03/2013 Cath: aneurysmal bleb in the LAD w/ otw nonobs dzs-->Med Rx.   Chronic back pain    Chronic knee pain    a. 09/2015 s/p R TKA.   Chronic pain    Chronic shoulder pain    Chronic sinusitis    COPD (  chronic obstructive pulmonary disease) (HCC)    Diabetes mellitus without complication (HCC)    type II    ESRD on peritoneal dialysis (HCC)    on peritoneal dialysis, [facility]   Essential hypertension    GERD (gastroesophageal reflux disease)    Gout    Gout    Hepatomegaly    noted on noncontrast CT 2015   History of hiatal hernia    Hyperlipidemia    Lateral meniscus tear    Obesity    Truncal   Obstructive sleep apnea    does not use cpap    On home oxygen therapy    uses 2l when is going somewhere per patient    PUD (peptic ulcer disease)    remote, reports f/u EGD about 8 years ago unremarkable    Reactive airway disease    related to exposure to chemical during 9/11   Sinusitis    Vitamin D deficiency     Tobacco Use: Social  History   Tobacco Use  Smoking Status Former   Current packs/day: 0.00   Average packs/day: 1 pack/day for 25.0 years (25.0 ttl pk-yrs)   Types: Cigarettes   Start date: 03/27/1985   Quit date: 03/27/2010   Years since quitting: 12.8  Smokeless Tobacco Never  Tobacco Comments   Quit x 7 years    Labs: Review Flowsheet  More data exists      Latest Ref Rng & Units 05/30/2022 06/03/2022 06/05/2022 06/06/2022 06/07/2022  Labs for ITP Cardiac and Pulmonary Rehab  Cholestrol 0 - 200 mg/dL - 88  - - -  LDL (calc) 0 - 99 mg/dL - 36  - - -  HDL-C >16 mg/dL - 32  - - -  Trlycerides <150 mg/dL - 98  - - -  Hemoglobin A1c 4.8 - 5.6 % 5.9  - - - -  PH, Arterial 7.35 - 7.45 - - 7.323  7.387  7.374  7.345  7.287  7.353  7.335  7.372  7.373  7.336   PCO2 arterial 32 - 48 mmHg - - 46.0  41.0  45.2  29.5  33.7  29.9  40.1  45.6  39.3  25.2   Bicarbonate 20.0 - 28.0 mmol/L 28.3  28.4  26.7  - 24.4  24.7  26.3  16.1  16.0  16.5  21.4  26.4  23.0  13.5   TCO2 22 - 32 mmol/L 30  30  28   - 26  22  26  26  28  28  17  17  17  23  28  24  14    Acid-base deficit 0.0 - 2.0 mmol/L - - 2.0  9.0  10.0  8.0  4.0  2.0  11.0   O2 Saturation % 65  63  97  - 94  99  100  98  91  99  90  99  98  98     Details       Multiple values from one day are sorted in reverse-chronological order         Capillary Blood Glucose: Lab Results  Component Value Date   GLUCAP 119 (H) 01/15/2023   GLUCAP 119 (H) 01/10/2023   GLUCAP 147 (H) 01/08/2023   GLUCAP 102 (H) 01/03/2023   GLUCAP 92 08/30/2022     Exercise Target Goals: Exercise Program Goal: Individual exercise prescription set using results from initial 6 min walk test and THRR while considering  patient's activity barriers  and safety.   Exercise Prescription Goal: Starting with aerobic activity 30 plus minutes a day, 3 days per week for initial exercise prescription. Provide home exercise prescription and guidelines that participant acknowledges  understanding prior to discharge.  Activity Barriers & Risk Stratification:  Activity Barriers & Cardiac Risk Stratification - 01/03/23 1027       Activity Barriers & Cardiac Risk Stratification   Activity Barriers Arthritis;Back Problems;Neck/Spine Problems;Joint Problems;Deconditioning;Shortness of Breath;Balance Concerns;Assistive Device    Cardiac Risk Stratification High             6 Minute Walk:  6 Minute Walk     Row Name 01/08/23 1200         6 Minute Walk   Phase Initial     Distance 1000 feet     Walk Time 5 minutes     # of Rest Breaks 0     MPH 1.89     METS 1.84     RPE 12     VO2 Peak 6.45     Symptoms No     Resting HR 66 bpm     Resting BP 120/72     Resting Oxygen Saturation  96 %     Exercise Oxygen Saturation  during 6 min walk 96 %     Max Ex. HR 99 bpm     Max Ex. BP 140/76     2 Minute Post BP 126/76              Oxygen Initial Assessment:   Oxygen Re-Evaluation:   Oxygen Discharge (Final Oxygen Re-Evaluation):   Initial Exercise Prescription:  Initial Exercise Prescription - 01/08/23 1200       Date of Initial Exercise RX and Referring Provider   Date 01/08/23    Referring Provider Dr. Rennis Golden    Expected Discharge Date 03/30/23      Treadmill   MPH 1.5    Grade 0    Minutes 17      NuStep   Level 1    SPM 80    Minutes 22      Prescription Details   Frequency (times per week) 3    Duration Progress to 30 minutes of continuous aerobic without signs/symptoms of physical distress      Intensity   THRR 40-80% of Max Heartrate 59-118    Ratings of Perceived Exertion 11-13    Perceived Dyspnea 0-4      Resistance Training   Training Prescription Yes    Weight 3    Reps 10-15             Perform Capillary Blood Glucose checks as needed.  Exercise Prescription Changes:   Exercise Prescription Changes     Row Name 01/17/23 1300 01/26/23 1100 01/29/23 1200         Response to Exercise   Blood  Pressure (Admit) 130/64 -- 126/62     Blood Pressure (Exercise) 140/64 -- 152/84     Blood Pressure (Exit) 136/62 -- 120/70     Heart Rate (Admit) 67 bpm -- 71 bpm     Heart Rate (Exercise) 79 bpm -- 91 bpm     Heart Rate (Exit) 82 bpm -- 70 bpm     Rating of Perceived Exertion (Exercise) 12 -- 12     Duration Continue with 30 min of aerobic exercise without signs/symptoms of physical distress. -- Continue with 30 min of aerobic exercise without signs/symptoms of physical distress.  Intensity THRR unchanged -- THRR unchanged       Progression   Progression Continue to progress workloads to maintain intensity without signs/symptoms of physical distress. -- Continue to progress workloads to maintain intensity without signs/symptoms of physical distress.       Resistance Training   Training Prescription Yes -- Yes     Weight 2 -- 2     Reps 10-15 -- 10-15     Time 10 Minutes -- --       Treadmill   MPH 1.5 -- 1.5     Grade 0 -- 0     Minutes 17 -- 15     METs 2.15 -- 2.15       NuStep   Level 2 -- 2     SPM 63 -- 100     Minutes 22 -- 15     METs 1.9 -- 2.2       Home Exercise Plan   Plans to continue exercise at -- Home (comment)  walking, seated elliptical --     Frequency -- Add 2 additional days to program exercise sessions. --     Initial Home Exercises Provided -- 01/26/23 --       Oxygen   Maintain Oxygen Saturation -- -- 88% or higher              Exercise Comments:   Exercise Goals and Review:   Exercise Goals     Row Name 01/09/23 0746 01/30/23 0840           Exercise Goals   Increase Physical Activity Yes Yes      Intervention Provide advice, education, support and counseling about physical activity/exercise needs.;Develop an individualized exercise prescription for aerobic and resistive training based on initial evaluation findings, risk stratification, comorbidities and participant's personal goals. Provide advice, education, support and  counseling about physical activity/exercise needs.;Develop an individualized exercise prescription for aerobic and resistive training based on initial evaluation findings, risk stratification, comorbidities and participant's personal goals.      Expected Outcomes Short Term: Attend rehab on a regular basis to increase amount of physical activity.;Long Term: Add in home exercise to make exercise part of routine and to increase amount of physical activity.;Long Term: Exercising regularly at least 3-5 days a week. Short Term: Attend rehab on a regular basis to increase amount of physical activity.;Long Term: Add in home exercise to make exercise part of routine and to increase amount of physical activity.;Long Term: Exercising regularly at least 3-5 days a week.      Increase Strength and Stamina Yes Yes      Intervention Provide advice, education, support and counseling about physical activity/exercise needs.;Develop an individualized exercise prescription for aerobic and resistive training based on initial evaluation findings, risk stratification, comorbidities and participant's personal goals. Provide advice, education, support and counseling about physical activity/exercise needs.;Develop an individualized exercise prescription for aerobic and resistive training based on initial evaluation findings, risk stratification, comorbidities and participant's personal goals.      Expected Outcomes Short Term: Increase workloads from initial exercise prescription for resistance, speed, and METs.;Short Term: Perform resistance training exercises routinely during rehab and add in resistance training at home;Long Term: Improve cardiorespiratory fitness, muscular endurance and strength as measured by increased METs and functional capacity ( ) Short Term: Increase workloads from initial exercise prescription for resistance, speed, and METs.;Short Term: Perform resistance training exercises routinely during rehab and add  in resistance training at home;Long Term: Improve cardiorespiratory fitness, muscular endurance  and strength as measured by increased METs and functional capacity ( )      Able to understand and use rate of perceived exertion (RPE) scale Yes Yes      Intervention Provide education and explanation on how to use RPE scale Provide education and explanation on how to use RPE scale      Expected Outcomes Short Term: Able to use RPE daily in rehab to express subjective intensity level;Long Term:  Able to use RPE to guide intensity level when exercising independently Short Term: Able to use RPE daily in rehab to express subjective intensity level;Long Term:  Able to use RPE to guide intensity level when exercising independently      Knowledge and understanding of Target Heart Rate Range (THRR) Yes Yes      Intervention Provide education and explanation of THRR including how the numbers were predicted and where they are located for reference Provide education and explanation of THRR including how the numbers were predicted and where they are located for reference      Expected Outcomes Short Term: Able to state/look up THRR;Long Term: Able to use THRR to govern intensity when exercising independently;Short Term: Able to use daily as guideline for intensity in rehab Short Term: Able to state/look up THRR;Long Term: Able to use THRR to govern intensity when exercising independently;Short Term: Able to use daily as guideline for intensity in rehab      Able to check pulse independently Yes Yes      Intervention Provide education and demonstration on how to check pulse in carotid and radial arteries.;Review the importance of being able to check your own pulse for safety during independent exercise Provide education and demonstration on how to check pulse in carotid and radial arteries.;Review the importance of being able to check your own pulse for safety during independent exercise      Expected Outcomes Short  Term: Able to explain why pulse checking is important during independent exercise;Long Term: Able to check pulse independently and accurately Short Term: Able to explain why pulse checking is important during independent exercise;Long Term: Able to check pulse independently and accurately      Understanding of Exercise Prescription Yes Yes      Intervention Provide education, explanation, and written materials on patient's individual exercise prescription Provide education, explanation, and written materials on patient's individual exercise prescription      Expected Outcomes Short Term: Able to explain program exercise prescription;Long Term: Able to explain home exercise prescription to exercise independently Short Term: Able to explain program exercise prescription;Long Term: Able to explain home exercise prescription to exercise independently               Exercise Goals Re-Evaluation :  Exercise Goals Re-Evaluation     Row Name 01/26/23 1135 01/30/23 0840           Exercise Goal Re-Evaluation   Exercise Goals Review Increase Physical Activity;Increase Strength and Stamina;Able to understand and use rate of perceived exertion (RPE) scale;Able to understand and use Dyspnea scale;Knowledge and understanding of Target Heart Rate Range (THRR);Able to check pulse independently;Understanding of Exercise Prescription Increase Physical Activity;Increase Strength and Stamina;Able to understand and use rate of perceived exertion (RPE) scale;Knowledge and understanding of Target Heart Rate Range (THRR);Able to check pulse independently;Understanding of Exercise Prescription      Comments Reviewed home exercise with pt today.  Pt plans to walk and use seated ellipitcal at home for exercise.  Reviewed THR, pulse, RPE, sign and symptoms,  pulse oximetery and when to call 911 or MD.  Also discussed weather considerations and indoor options.  Pt voiced understanding. Pt has completed 8 sessions of cardiac  rehab. He is currently exercising at 2.2 METs on the stepper. He is planning on walking and using seated ellipitcal at home to exercise on days that he is not in class. Will continue to monitor and progress as able.      Expected Outcomes Short: Start to add in exercise at home on off days Long: Continue to exercise indpedently Through exercise at rehab and home, patient will acheive their goals.                Discharge Exercise Prescription (Final Exercise Prescription Changes):  Exercise Prescription Changes - 01/29/23 1200       Response to Exercise   Blood Pressure (Admit) 126/62    Blood Pressure (Exercise) 152/84    Blood Pressure (Exit) 120/70    Heart Rate (Admit) 71 bpm    Heart Rate (Exercise) 91 bpm    Heart Rate (Exit) 70 bpm    Rating of Perceived Exertion (Exercise) 12    Duration Continue with 30 min of aerobic exercise without signs/symptoms of physical distress.    Intensity THRR unchanged      Progression   Progression Continue to progress workloads to maintain intensity without signs/symptoms of physical distress.      Resistance Training   Training Prescription Yes    Weight 2    Reps 10-15      Treadmill   MPH 1.5    Grade 0    Minutes 15    METs 2.15      NuStep   Level 2    SPM 100    Minutes 15    METs 2.2      Oxygen   Maintain Oxygen Saturation 88% or higher             Nutrition:  Target Goals: Understanding of nutrition guidelines, daily intake of sodium 1500mg , cholesterol 200mg , calories 30% from fat and 7% or less from saturated fats, daily to have 5 or more servings of fruits and vegetables.  Biometrics:  Pre Biometrics - 01/09/23 0747       Pre Biometrics   Height 5\' 6"  (1.676 m)    Weight 221 lb 5.5 oz (100.4 kg)    Waist Circumference 47 inches    Hip Circumference 45 inches    Waist to Hip Ratio 1.04 %    BMI (Calculated) 35.74    Triceps Skinfold 25 mm    % Body Fat 26.4 %    Grip Strength 34.1 kg     Flexibility 0 in    Single Leg Stand 0 seconds              Nutrition Therapy Plan and Nutrition Goals:  Nutrition Therapy & Goals - 01/22/23 1042       Nutrition Therapy   RD appointment deferred Yes      Personal Nutrition Goals   Comments We provide educational sessions on heart healthy nutrition with handouts during the program.      Intervention Plan   Intervention Nutrition handout(s) given to patient.    Expected Outcomes Short Term Goal: Understand basic principles of dietary content, such as calories, fat, sodium, cholesterol and nutrients.             Nutrition Assessments:  Nutrition Assessments - 01/03/23 1040       MEDFICTS  Scores   Pre Score 26            MEDIFICTS Score Key: ?70 Need to make dietary changes  40-70 Heart Healthy Diet ? 40 Therapeutic Level Cholesterol Diet   Picture Your Plate Scores: <40 Unhealthy dietary pattern with much room for improvement. 41-50 Dietary pattern unlikely to meet recommendations for good health and room for improvement. 51-60 More healthful dietary pattern, with some room for improvement.  >60 Healthy dietary pattern, although there may be some specific behaviors that could be improved.    Nutrition Goals Re-Evaluation:   Nutrition Goals Discharge (Final Nutrition Goals Re-Evaluation):   Psychosocial: Target Goals: Acknowledge presence or absence of significant depression and/or stress, maximize coping skills, provide positive support system. Participant is able to verbalize types and ability to use techniques and skills needed for reducing stress and depression.  Initial Review & Psychosocial Screening:  Initial Psych Review & Screening - 01/03/23 1034       Initial Review   Current issues with Current Sleep Concerns      Family Dynamics   Good Support System? Yes    Comments The pt's wife and his 2 daughters are his support system.      Barriers   Psychosocial barriers to participate in  program There are no identifiable barriers or psychosocial needs.      Screening Interventions   Interventions Encouraged to exercise;To provide support and resources with identified psychosocial needs;Provide feedback about the scores to participant    Expected Outcomes Short Term goal: Identification and review with participant of any Quality of Life or Depression concerns found by scoring the questionnaire.;Long Term goal: The participant improves quality of Life and PHQ9 Scores as seen by post scores and/or verbalization of changes             Quality of Life Scores:  Scores of 19 and below usually indicate a poorer quality of life in these areas.  A difference of  2-3 points is a clinically meaningful difference.  A difference of 2-3 points in the total score of the Quality of Life Index has been associated with significant improvement in overall quality of life, self-image, physical symptoms, and general health in studies assessing change in quality of life.  PHQ-9: Review Flowsheet  More data exists      01/03/2023 08/08/2022 02/01/2022 12/29/2021 09/29/2021  Depression screen PHQ 2/9  Decreased Interest 3 0 0 0 0  Down, Depressed, Hopeless 0 1 0 1 0  PHQ - 2 Score 3 1 0 1 0  Altered sleeping 3 0 - - 0  Tired, decreased energy 3 3 - - 3  Change in appetite 2 2 - - 1  Feeling bad or failure about yourself  0 0 - - 2  Trouble concentrating 0 0 - - 1  Moving slowly or fidgety/restless 0 0 - - 0  Suicidal thoughts 0 0 - - 0  PHQ-9 Score 11 6 - - 7  Difficult doing work/chores Somewhat difficult Somewhat difficult - - Somewhat difficult    Details           Interpretation of Total Score  Total Score Depression Severity:  1-4 = Minimal depression, 5-9 = Mild depression, 10-14 = Moderate depression, 15-19 = Moderately severe depression, 20-27 = Severe depression   Psychosocial Evaluation and Intervention:  Psychosocial Evaluation - 01/03/23 1132       Psychosocial  Evaluation & Interventions   Interventions Stress management education;Relaxation education;Encouraged to exercise with  the program and follow exercise prescription    Comments Pt has no identifiable psychosocial barriers to completing the CR program.  He scored an 11 on his PHQ-9 related to his fatigue, trouble staying asleep, and having little interest in doing things (because of his fatigue).  He denies having any anxiety/depression, and he does not want to be placed on any sleep aids.  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  The pt states he has had a poor appetite since his CABG which is potentially contributing to his fatigue, but he did not want to be referred to a dietician.  He continues to undergo peritoneal dialysis at home.  The pt has a good support system with his wife and 2 daughters.  His goal for the program is to improve his overall health, and we will continue to monitor his progress as he works toward meeting this goal.    Expected Outcomes Pt will continue to have no identifiable psychosocial barriers.    Continue Psychosocial Services  No Follow up required             Psychosocial Re-Evaluation:  Psychosocial Re-Evaluation     Row Name 01/22/23 1043             Psychosocial Re-Evaluation   Current issues with None Identified       Comments Patient is new to the program. He has completed 6 sessions. He continues to have no psychosocial barriers identified. He seems to enjoy the program and demonstrates an interest in improving his health. We will continue to montior his progress.       Expected Outcomes Patient will continue to have no psychosocial barriers or issues identified.       Interventions Encouraged to attend Cardiac Rehabilitation for the exercise;Stress management education;Relaxation education       Continue Psychosocial Services  No Follow up required                Psychosocial  Discharge (Final Psychosocial Re-Evaluation):  Psychosocial Re-Evaluation - 01/22/23 1043       Psychosocial Re-Evaluation   Current issues with None Identified    Comments Patient is new to the program. He has completed 6 sessions. He continues to have no psychosocial barriers identified. He seems to enjoy the program and demonstrates an interest in improving his health. We will continue to montior his progress.    Expected Outcomes Patient will continue to have no psychosocial barriers or issues identified.    Interventions Encouraged to attend Cardiac Rehabilitation for the exercise;Stress management education;Relaxation education    Continue Psychosocial Services  No Follow up required             Vocational Rehabilitation: Provide vocational rehab assistance to qualifying candidates.   Vocational Rehab Evaluation & Intervention:  Vocational Rehab - 01/03/23 1048       Initial Vocational Rehab Evaluation & Intervention   Assessment shows need for Vocational Rehabilitation Yes      Vocational Rehab Re-Evaulation   Comments Pt is interested in talking to a counselor about to discuss vocational rehab services.             Education: Education Goals: Education classes will be provided on a weekly basis, covering required topics. Participant will state understanding/return demonstration of topics presented.  Learning Barriers/Preferences:  Learning Barriers/Preferences - 01/03/23 1043       Learning Barriers/Preferences   Learning Barriers  None    Learning Preferences Verbal Instruction;Written Material             Education Topics: Hypertension, Hypertension Reduction -Define heart disease and high blood pressure. Discus how high blood pressure affects the body and ways to reduce high blood pressure. Flowsheet Row CARDIAC REHAB PHASE II EXERCISE from 01/31/2023 in [place] CARDIAC REHABILITATION  Date 01/17/23  Educator HB  Instruction Review Code 1-  Verbalizes Understanding       Exercise and Your Heart -Discuss why it is important to exercise, the FITT principles of exercise, normal and abnormal responses to exercise, and how to exercise safely. Flowsheet Row CARDIAC REHAB PHASE II EXERCISE from 11/30/2021 in [place] CARDIAC REHABILITATION  Date 10/26/21  Educator HJ  Instruction Review Code 1- Verbalizes Understanding       Angina -Discuss definition of angina, causes of angina, treatment of angina, and how to decrease risk of having angina. Flowsheet Row CARDIAC REHAB PHASE II EXERCISE from 01/31/2023 in [place] CARDIAC REHABILITATION  Date 01/31/23  Educator HW  Instruction Review Code 1- Verbalizes Understanding       Cardiac Medications -Review what the following cardiac medications are used for, how they affect the body, and side effects that may occur when taking the medications.  Medications include Aspirin, Beta blockers, calcium channel blockers, ACE Inhibitors, angiotensin receptor blockers, diuretics, digoxin, and antihyperlipidemics. Flowsheet Row CARDIAC REHAB PHASE II EXERCISE from 09/13/2022 in [place] CARDIAC REHABILITATION  Date 08/16/22  Educator HJ  Instruction Review Code 1- Verbalizes Understanding       Congestive Heart Failure -Discuss the definition of CHF, how to live with CHF, the signs and symptoms of CHF, and how keep track of weight and sodium intake. Flowsheet Row CARDIAC REHAB PHASE II EXERCISE from 11/30/2021 in [place] CARDIAC REHABILITATION  Date 11/16/21  Educator hj  Instruction Review Code 2- Demonstrated Understanding       Heart Disease and Intimacy -Discus the effect sexual activity has on the heart, how changes occur during intimacy as we age, and safety during sexual activity. Flowsheet Row CARDIAC REHAB PHASE II EXERCISE from 11/30/2021 in [place] CARDIAC REHABILITATION  Date 11/23/21  Educator pb  Instruction Review Code 1- Verbalizes Understanding        Smoking Cessation / COPD -Discuss different methods to quit smoking, the health benefits of quitting smoking, and the definition of COPD. Flowsheet Row CARDIAC REHAB PHASE II EXERCISE from 09/13/2022 in [place] CARDIAC REHABILITATION  Date 09/06/22  Educator HB  Instruction Review Code 1- Verbalizes Understanding       Nutrition I: Fats -Discuss the types of cholesterol, what cholesterol does to the heart, and how cholesterol levels can be controlled. Flowsheet Row CARDIAC REHAB PHASE II EXERCISE from 09/13/2022 in [place] CARDIAC REHABILITATION  Date 09/13/22  Educator HB  Instruction Review Code 2- Demonstrated Understanding       Nutrition II: Labels -Discuss the different components of food labels and how to read food label   Heart Parts/Heart Disease and PAD -Discuss the anatomy of the heart, the pathway of blood circulation through the heart, and these are affected by heart disease.   Stress I: Signs and Symptoms -Discuss the causes of stress, how stress may lead to anxiety and depression, and ways to limit stress.   Stress II: Relaxation -Discuss different types of relaxation techniques to limit stress. Flowsheet Row CARDIAC REHAB PHASE II EXERCISE from 10/05/2021 in [place] CARDIAC REHABILITATION  Date 10/05/21  Educator pb  Instruction Review Code 1- Verbalizes Understanding       Warning Signs of Stroke / TIA -Discuss definition of a stroke, what the signs and symptoms are of a stroke, and how to identify when someone is having stroke. Flowsheet Row CARDIAC REHAB PHASE II EXERCISE from 01/10/2023 in [place] CARDIAC REHABILITATION  Date 01/10/23  Educator HB       Knowledge Questionnaire Score:  Knowledge Questionnaire Score - 01/03/23 1044       Knowledge Questionnaire Score   Pre Score 20/24             Core Components/Risk Factors/Patient Goals at Admission:  Personal Goals and Risk Factors at Admission - 01/03/23 1051        Core Components/Risk Factors/Patient Goals on Admission    Weight Management Yes;Obesity    Intervention Obesity: Provide education and appropriate resources to help participant work on and attain dietary goals.    Expected Outcomes Short Term: Continue to assess and modify interventions until short term weight is achieved;Long Term: Adherence to nutrition and physical activity/exercise program aimed toward attainment of established weight goal;Weight Loss: Understanding of general recommendations for a balanced deficit meal plan, which promotes 1-2 lb weight loss per week and includes a negative energy balance of [phone] kcal/d;Understanding of distribution of calorie intake throughout the day with the consumption of 4-5 meals/snacks;Understanding recommendations for meals to include 15-35% energy as protein, 25-35% energy from fat, 35-60% energy from carbohydrates, less than 200mg  of dietary cholesterol, 20-35 gm of total fiber daily    Improve shortness of breath with ADL's Yes    Intervention Provide education, individualized exercise plan and daily activity instruction to help decrease symptoms of SOB with activities of daily living.    Expected Outcomes Short Term: Improve cardiorespiratory fitness to achieve a reduction of symptoms when performing ADLs;Long Term: Be able to perform more ADLs without symptoms or delay the onset of symptoms    Diabetes Yes    Intervention Provide education about signs/symptoms and action to take for hypo/hyperglycemia.;Provide education about proper nutrition, including hydration, and aerobic/resistive exercise prescription along with prescribed medications to achieve blood glucose in normal ranges: Fasting glucose 65-99 mg/dL    Expected Outcomes Short Term: Participant verbalizes understanding of the signs/symptoms and immediate care of hyper/hypoglycemia, proper foot care and importance of medication, aerobic/resistive exercise and nutrition plan for blood  glucose control.;Long Term: Attainment of HbA1C < 7%.    Hypertension Yes    Intervention Provide education on lifestyle modifcations including regular physical activity/exercise, weight management, moderate sodium restriction and increased consumption of fresh fruit, vegetables, and low fat dairy, alcohol moderation, and smoking cessation.;Monitor prescription use compliance.    Expected Outcomes Short Term: Continued assessment and intervention until BP is < 140/70mm HG in hypertensive participants. < 130/95mm HG in hypertensive participants with diabetes, heart failure or chronic kidney disease.;Long Term: Maintenance of blood pressure at goal levels.    Lipids Yes    Intervention Provide education and support for participant on nutrition & aerobic/resistive exercise along with prescribed medications to achieve LDL 70mg , HDL >40mg .    Expected Outcomes Short Term: Participant states understanding of desired cholesterol values and is compliant with medications prescribed. Participant is following exercise prescription and nutrition guidelines.;Long Term: Cholesterol controlled with medications as prescribed, with individualized exercise RX and with personalized nutrition plan. Value goals: LDL < 70mg , HDL > 40 mg.    Personal Goal Other Yes    Personal Goal Pt wants  to improve his overall health.    Intervention Pt will attend CR program 3 times a week and also start a home exercise program.    Expected Outcomes Pt will complete the program meeting both his personal and program goals.             Core Components/Risk Factors/Patient Goals Review:   Goals and Risk Factor Review     Row Name 01/22/23 1044             Core Components/Risk Factors/Patient Goals Review   Personal Goals Review Weight Management/Obesity;Diabetes;Improve shortness of breath with ADL's;Other;Hypertension;Lipids       Review Patient was referred to CR with [facility]. He has multiple risk factors for CAD and is  participating in the program for risk modification. He has completed 6 sessions. His current weight is 225.4 lbs maintained from his orientation visit. Patient has participated in the program twice. His blood pressure is labile. He is being evaluated by PT for chronic right shoulder pain. His last A1C was 5.9% 05/30/22. His DM is managed with Insulin. His personal goals for the program is to improve his overall health. We will continue to montior his progress as he works towards meeting these goals.       Expected Outcomes Patient will complete the program meeting both program and personal goals.                Core Components/Risk Factors/Patient Goals at Discharge (Final Review):   Goals and Risk Factor Review - 01/22/23 1044       Core Components/Risk Factors/Patient Goals Review   Personal Goals Review Weight Management/Obesity;Diabetes;Improve shortness of breath with ADL's;Other;Hypertension;Lipids    Review Patient was referred to CR with [facility]. He has multiple risk factors for CAD and is participating in the program for risk modification. He has completed 6 sessions. His current weight is 225.4 lbs maintained from his orientation visit. Patient has participated in the program twice. His blood pressure is labile. He is being evaluated by PT for chronic right shoulder pain. His last A1C was 5.9% 05/30/22. His DM is managed with Insulin. His personal goals for the program is to improve his overall health. We will continue to montior his progress as he works towards meeting these goals.    Expected Outcomes Patient will complete the program meeting both program and personal goals.             ITP Comments:  ITP Comments     Row Name 01/31/23 1305           ITP Comments 30 day review completed. ITP sent to Dr. Dina Rich, Medical Director of Cardiac Rehab. Continue with ITP unless changes are made by physician.                Comments: 30 day review

## 2023-02-01 ENCOUNTER — Encounter (HOSPITAL_COMMUNITY): Payer: Self-pay | Admitting: Occupational Therapy

## 2023-02-01 ENCOUNTER — Ambulatory Visit (HOSPITAL_COMMUNITY): Payer: Medicare Other | Admitting: Occupational Therapy

## 2023-02-01 DIAGNOSIS — G8929 Other chronic pain: Secondary | ICD-10-CM

## 2023-02-01 DIAGNOSIS — Z992 Dependence on renal dialysis: Secondary | ICD-10-CM | POA: Diagnosis not present

## 2023-02-01 DIAGNOSIS — M25611 Stiffness of right shoulder, not elsewhere classified: Secondary | ICD-10-CM | POA: Diagnosis not present

## 2023-02-01 DIAGNOSIS — D509 Iron deficiency anemia, unspecified: Secondary | ICD-10-CM | POA: Diagnosis not present

## 2023-02-01 DIAGNOSIS — R29898 Other symptoms and signs involving the musculoskeletal system: Secondary | ICD-10-CM

## 2023-02-01 DIAGNOSIS — D631 Anemia in chronic kidney disease: Secondary | ICD-10-CM | POA: Diagnosis not present

## 2023-02-01 DIAGNOSIS — N186 End stage renal disease: Secondary | ICD-10-CM | POA: Diagnosis not present

## 2023-02-01 DIAGNOSIS — M25511 Pain in right shoulder: Secondary | ICD-10-CM | POA: Diagnosis not present

## 2023-02-01 DIAGNOSIS — N250 Renal osteodystrophy: Secondary | ICD-10-CM | POA: Diagnosis not present
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
## 2023-02-01 NOTE — Patient Instructions (Signed)
Repeat all exercises 10-15 times, 1-2 times per day.  1) Shoulder Protraction    Begin with elbows by your side, slowly "punch" straight out in front of you.      2) Shoulder Flexion  Supine:     Standing:         Begin with arms at your side with thumbs pointed up, slowly raise both arms up and forward towards overhead.               3) Horizontal abduction/adduction  Supine:   Standing:           Begin with arms straight out in front of you, bring out to the side in at "T" shape. Keep arms straight entire time.                 4) Internal & External Rotation   Supine:     Standing:     Stand with elbows at the side and elbows bent 90 degrees. Move your forearms away from your body, then bring back inward toward the body.     5) Shoulder Abduction  Supine:     Standing:       Lying on your back begin with your arms flat on the table next to your side. Slowly move your arms out to the side so that they go overhead, in a jumping jack or snow angel movement.          

## 2023-02-02 ENCOUNTER — Encounter (HOSPITAL_COMMUNITY): Payer: Medicare Other

## 2023-02-02 ENCOUNTER — Telehealth (HOSPITAL_COMMUNITY): Payer: Self-pay | Admitting: *Deleted

## 2023-02-02 DIAGNOSIS — N186 End stage renal disease: Secondary | ICD-10-CM | POA: Diagnosis not present

## 2023-02-02 DIAGNOSIS — N250 Renal osteodystrophy: Secondary | ICD-10-CM | POA: Diagnosis not present

## 2023-02-02 DIAGNOSIS — D631 Anemia in chronic kidney disease: Secondary | ICD-10-CM | POA: Diagnosis not present

## 2023-02-02 DIAGNOSIS — D509 Iron deficiency anemia, unspecified: Secondary | ICD-10-CM | POA: Diagnosis not present

## 2023-02-02 DIAGNOSIS — Z992 Dependence on renal dialysis: Secondary | ICD-10-CM | POA: Diagnosis not present
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
## 2023-02-03 DIAGNOSIS — D631 Anemia in chronic kidney disease: Secondary | ICD-10-CM | POA: Diagnosis not present

## 2023-02-03 DIAGNOSIS — D509 Iron deficiency anemia, unspecified: Secondary | ICD-10-CM | POA: Diagnosis not present

## 2023-02-03 DIAGNOSIS — N186 End stage renal disease: Secondary | ICD-10-CM | POA: Diagnosis not present

## 2023-02-03 DIAGNOSIS — Z992 Dependence on renal dialysis: Secondary | ICD-10-CM | POA: Diagnosis not present

## 2023-02-03 DIAGNOSIS — N250 Renal osteodystrophy: Secondary | ICD-10-CM | POA: Diagnosis not present

## 2023-02-04 DIAGNOSIS — Z992 Dependence on renal dialysis: Secondary | ICD-10-CM | POA: Diagnosis not present

## 2023-02-04 DIAGNOSIS — N186 End stage renal disease: Secondary | ICD-10-CM | POA: Diagnosis not present

## 2023-02-04 DIAGNOSIS — N250 Renal osteodystrophy: Secondary | ICD-10-CM | POA: Diagnosis not present

## 2023-02-04 DIAGNOSIS — D509 Iron deficiency anemia, unspecified: Secondary | ICD-10-CM | POA: Diagnosis not present

## 2023-02-04 DIAGNOSIS — D631 Anemia in chronic kidney disease: Secondary | ICD-10-CM | POA: Diagnosis not present

## 2023-02-05 ENCOUNTER — Encounter (HOSPITAL_COMMUNITY)
Admission: RE | Admit: 2023-02-05 | Discharge: 2023-02-05 | Disposition: A | Discharge status: Home / Self Care | Payer: Medicare Other | Source: Ambulatory Visit | Attending: Internal Medicine | Admitting: Internal Medicine

## 2023-02-05 DIAGNOSIS — Z951 Presence of aortocoronary bypass graft: Secondary | ICD-10-CM | POA: Diagnosis not present

## 2023-02-05 DIAGNOSIS — I214 Non-ST elevation (NSTEMI) myocardial infarction: Secondary | ICD-10-CM | POA: Diagnosis not present

## 2023-02-05 DIAGNOSIS — D631 Anemia in chronic kidney disease: Secondary | ICD-10-CM | POA: Diagnosis not present

## 2023-02-05 DIAGNOSIS — N186 End stage renal disease: Secondary | ICD-10-CM | POA: Diagnosis not present

## 2023-02-05 DIAGNOSIS — D509 Iron deficiency anemia, unspecified: Secondary | ICD-10-CM | POA: Diagnosis not present

## 2023-02-05 DIAGNOSIS — Z992 Dependence on renal dialysis: Secondary | ICD-10-CM | POA: Diagnosis not present

## 2023-02-05 DIAGNOSIS — N250 Renal osteodystrophy: Secondary | ICD-10-CM | POA: Diagnosis not present

## 2023-02-05 NOTE — Progress Notes (Signed)
Daily Session Note  Patient Details  Name: [name] [name] MRN: [id number] Date of Birth: Aug 24, 1950 Referring Provider:   Flowsheet Row CARDIAC REHAB PHASE II EXERCISE from 01/08/2023 in [facility] CARDIAC REHABILITATION  Referring Provider Dr. Rennis Golden       Encounter Date: 02/05/2023  Check In:  Session Check In - 02/05/23 1100       Check-In   Supervising physician immediately available to respond to emergencies See telemetry face sheet for immediately available MD    Location [facility]    Staff Present Ross Ludwig, BS, Exercise Physiologist;Daphyne Daphine Deutscher, RN, BSN    Virtual Visit No    Medication changes reported     No    Fall or balance concerns reported    Yes    Comments Pt has had 2 falls with no injury, and he uses a cane as needed due to poor balance.    Tobacco Cessation No Change    Warm-up and Cool-down Performed on first and last piece of equipment    Resistance Training Performed Yes    VAD Patient? No    PAD/SET Patient? No      Pain Assessment   Currently in Pain? No/denies    Pain Score 0-No pain    Multiple Pain Sites No             Capillary Blood Glucose: No results found for this or any previous visit (from the past 24 hour(s)).    Social History   Tobacco Use  Smoking Status Former   Current packs/day: 0.00   Average packs/day: 1 pack/day for 25.0 years (25.0 ttl pk-yrs)   Types: Cigarettes   Start date: 03/27/1985   Quit date: 03/27/2010   Years since quitting: 12.8  Smokeless Tobacco Never  Tobacco Comments   Quit x 7 years    Goals Met:  Independence with exercise equipment Exercise tolerated well No report of concerns or symptoms today Strength training completed today  Goals Unmet:  Not Applicable  Comments: Pt able to follow exercise prescription today without complaint.  Will continue to monitor for progression.    Dr. Dina Rich is Medical Director for [facility] Cardiac [facility]

## 2023-02-06 ENCOUNTER — Ambulatory Visit (HOSPITAL_COMMUNITY): Payer: Medicare Other | Admitting: Occupational Therapy

## 2023-02-06 ENCOUNTER — Encounter (HOSPITAL_COMMUNITY): Payer: Self-pay | Admitting: Occupational Therapy

## 2023-02-06 DIAGNOSIS — N250 Renal osteodystrophy: Secondary | ICD-10-CM | POA: Diagnosis not present

## 2023-02-06 DIAGNOSIS — G8929 Other chronic pain: Secondary | ICD-10-CM | POA: Diagnosis not present

## 2023-02-06 DIAGNOSIS — D509 Iron deficiency anemia, unspecified: Secondary | ICD-10-CM | POA: Diagnosis not present

## 2023-02-06 DIAGNOSIS — M25611 Stiffness of right shoulder, not elsewhere classified: Secondary | ICD-10-CM

## 2023-02-06 DIAGNOSIS — D631 Anemia in chronic kidney disease: Secondary | ICD-10-CM | POA: Diagnosis not present

## 2023-02-06 DIAGNOSIS — R29898 Other symptoms and signs involving the musculoskeletal system: Secondary | ICD-10-CM | POA: Diagnosis not present

## 2023-02-06 DIAGNOSIS — M25511 Pain in right shoulder: Secondary | ICD-10-CM | POA: Diagnosis not present

## 2023-02-06 DIAGNOSIS — N186 End stage renal disease: Secondary | ICD-10-CM | POA: Diagnosis not present

## 2023-02-06 DIAGNOSIS — Z992 Dependence on renal dialysis: Secondary | ICD-10-CM | POA: Diagnosis not present
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
## 2023-02-06 NOTE — Patient Instructions (Signed)
1) Strengthening: Chest Pull - Resisted   Hold Theraband in front of body with hands about shoulder width a part. Pull band a part and back together slowly. Repeat __10-15__ times. Complete ___1_ set(s) per session.. Repeat ___1_ session(s) per day.  http://orth.exer.us/926   Copyright  VHI. All rights reserved.   2) PNF Strengthening: Resisted   Standing with resistive band around each hand, bring right arm up and away, thumb back. Repeat _10-15___ times per set. Do __1__ sets per session. Do __1__ sessions per day.    3) Resisted External Rotation: in Neutral - Bilateral   Sit or stand, tubing in both hands, elbows at sides, bent to 90, forearms forward. Pinch shoulder blades together and rotate forearms out. Keep elbows at sides. Repeat _10-15___ times per set. Do __1__ sets per session. Do _1___ sessions per day.  http://orth.exer.us/966   Copyright  VHI. All rights reserved.   4) PNF Strengthening: Resisted   Standing, hold resistive band above head. Bring right arm down and out from side. Repeat _10-15___ times per set. Do __1__ sets per session. Do __1__ sessions per day.  http://orth.exer.us/922   Copyright  VHI. All rights reserved.     Complete _______ repetitions each. Complete _______ time a day.  Wall taps with theraband  With a looped elastic band around forearms/wrists and arms at a 90 angle pressed against the wall. Keeping elbows on the wall, tap right arm out to the right. Hold for 1 second. Return right arm back to neutral. Repeat with left arm.    Wall V slides with Theraband  Place a band loop around hands/forearms and face a wall. Extend both arms diagonally into a V shape on the wall. Hold this stretch for specified amount of time. Lower arms slowly back into neutral position. Repeat.       scap clocks  Tie a loop with a theraband and place around your wrists.  Stand with a wall in front of you.  Picture a clock in front of you.  Place  both palms on the wall, arms straight.  While keeping the left/right hand planted, use the right/left hand to pull away and tap each number (1, 3, 5- right OR 11,9,7 left), coming back to center each time.    1) (Home) Extension: Isometric / Bilateral Arm Retraction - Sitting   Facing anchor, hold hands and elbow at shoulder height, with elbow bent.  Pull arms back to squeeze shoulder blades together. Repeat 10-15 times. 1-3 times/day.   2) (Clinic) Extension / Flexion (Assist)   Face anchor, pull arms back, keeping elbow straight, and squeze shoulder blades together. Repeat 10-15 times. 1-3 times/day.   Copyright  VHI. All rights reserved.   3) (Home) Retraction: Row - Bilateral (Anchor)   Facing anchor, arms reaching forward, pull hands toward stomach, keeping elbows bent and at your sides and pinching shoulder blades together. Repeat 10-15 times. 1-3 times/day.   Copyright  VHI. All rights reserved.

## 2023-02-07 ENCOUNTER — Encounter (HOSPITAL_COMMUNITY)
Admission: RE | Admit: 2023-02-07 | Discharge: 2023-02-07 | Disposition: A | Discharge status: Home / Self Care | Payer: Medicare Other | Source: Ambulatory Visit | Attending: Internal Medicine | Admitting: Internal Medicine

## 2023-02-07 DIAGNOSIS — I214 Non-ST elevation (NSTEMI) myocardial infarction: Secondary | ICD-10-CM

## 2023-02-07 DIAGNOSIS — D631 Anemia in chronic kidney disease: Secondary | ICD-10-CM | POA: Diagnosis not present

## 2023-02-07 DIAGNOSIS — Z951 Presence of aortocoronary bypass graft: Secondary | ICD-10-CM

## 2023-02-07 DIAGNOSIS — N250 Renal osteodystrophy: Secondary | ICD-10-CM | POA: Diagnosis not present

## 2023-02-07 DIAGNOSIS — N186 End stage renal disease: Secondary | ICD-10-CM | POA: Diagnosis not present

## 2023-02-07 DIAGNOSIS — D509 Iron deficiency anemia, unspecified: Secondary | ICD-10-CM | POA: Diagnosis not present

## 2023-02-07 DIAGNOSIS — Z992 Dependence on renal dialysis: Secondary | ICD-10-CM | POA: Diagnosis not present

## 2023-02-07 LAB — GLUCOSE, CAPILLARY: Glucose-Capillary: 117 mg/dL — ABNORMAL HIGH (ref 70–99)

## 2023-02-07 NOTE — Progress Notes (Signed)
Daily Session Note  Patient Details  Name: [name] [name] MRN: [id number] Date of Birth: [birth year] Referring Provider:   Flowsheet Row CARDIAC REHAB PHASE II EXERCISE from 01/08/2023 in [facility] CARDIAC REHABILITATION  Referring Provider Dr. Rennis Golden       Encounter Date: 02/07/2023  Check In:  Session Check In - 02/07/23 1100       Check-In   Supervising physician immediately available to respond to emergencies See telemetry face sheet for immediately available MD    Physician(s) Dr. Wyline Mood    Location [facility]    Staff Present Ross Ludwig, BS, Exercise Physiologist;Hillary Troutman BSN, RN    Virtual Visit No    Medication changes reported     No    Fall or balance concerns reported    Yes    Comments Pt has had 2 falls with no injury, and he uses a cane as needed due to poor balance.    Tobacco Cessation No Change    Warm-up and Cool-down Performed on first and last piece of equipment    Resistance Training Performed Yes    VAD Patient? No    PAD/SET Patient? No      Pain Assessment   Currently in Pain? No/denies    Pain Score 0-No pain             Capillary Blood Glucose: Results for orders placed or performed during the hospital encounter of 02/05/23 (from the past 24 hour(s))  Glucose, capillary     Status: Abnormal   Collection Time: 02/07/23 10:50 AM  Result Value Ref Range   Glucose-Capillary 117 (H) 70 - 99 mg/dL      Social History   Tobacco Use  Smoking Status Former   Current packs/day: 0.00   Average packs/day: 1 pack/day for 25.0 years (25.0 ttl pk-yrs)   Types: Cigarettes   Start date: 03/27/1985   Quit date: 03/27/2010   Years since quitting: 12.8  Smokeless Tobacco Never  Tobacco Comments   Quit x 7 years    Goals Met:  Independence with exercise equipment Exercise tolerated well No report of concerns or symptoms today Strength training completed today  Goals Unmet:  Not Applicable  Comments: Pt able to  follow exercise prescription today without complaint.  Will continue to monitor for progression.    Dr. Dina Rich is Medical Director for [facility] Cardiac Rehab

## 2023-02-08 ENCOUNTER — Encounter (HOSPITAL_COMMUNITY): Payer: Medicare Other | Admitting: Occupational Therapy

## 2023-02-08 DIAGNOSIS — D631 Anemia in chronic kidney disease: Secondary | ICD-10-CM | POA: Diagnosis not present

## 2023-02-08 DIAGNOSIS — N250 Renal osteodystrophy: Secondary | ICD-10-CM | POA: Diagnosis not present

## 2023-02-08 DIAGNOSIS — D509 Iron deficiency anemia, unspecified: Secondary | ICD-10-CM | POA: Diagnosis not present

## 2023-02-08 DIAGNOSIS — Z992 Dependence on renal dialysis: Secondary | ICD-10-CM | POA: Diagnosis not present

## 2023-02-08 DIAGNOSIS — N186 End stage renal disease: Secondary | ICD-10-CM | POA: Diagnosis not present

## 2023-02-09 ENCOUNTER — Encounter (HOSPITAL_COMMUNITY): Payer: Medicare Other

## 2023-02-09 DIAGNOSIS — Z992 Dependence on renal dialysis: Secondary | ICD-10-CM | POA: Diagnosis not present

## 2023-02-09 DIAGNOSIS — D631 Anemia in chronic kidney disease: Secondary | ICD-10-CM | POA: Diagnosis not present

## 2023-02-09 DIAGNOSIS — D509 Iron deficiency anemia, unspecified: Secondary | ICD-10-CM | POA: Diagnosis not present

## 2023-02-09 DIAGNOSIS — N186 End stage renal disease: Secondary | ICD-10-CM | POA: Diagnosis not present

## 2023-02-09 DIAGNOSIS — N250 Renal osteodystrophy: Secondary | ICD-10-CM | POA: Diagnosis not present

## 2023-02-10 DIAGNOSIS — D631 Anemia in chronic kidney disease: Secondary | ICD-10-CM | POA: Diagnosis not present

## 2023-02-10 DIAGNOSIS — N250 Renal osteodystrophy: Secondary | ICD-10-CM | POA: Diagnosis not present

## 2023-02-10 DIAGNOSIS — Z992 Dependence on renal dialysis: Secondary | ICD-10-CM | POA: Diagnosis not present

## 2023-02-10 DIAGNOSIS — U071 COVID-19: Secondary | ICD-10-CM | POA: Diagnosis not present

## 2023-02-10 DIAGNOSIS — D509 Iron deficiency anemia, unspecified: Secondary | ICD-10-CM | POA: Diagnosis not present

## 2023-02-10 DIAGNOSIS — N186 End stage renal disease: Secondary | ICD-10-CM | POA: Diagnosis not present

## 2023-02-11 DIAGNOSIS — N250 Renal osteodystrophy: Secondary | ICD-10-CM | POA: Diagnosis not present

## 2023-02-11 DIAGNOSIS — D631 Anemia in chronic kidney disease: Secondary | ICD-10-CM | POA: Diagnosis not present

## 2023-02-11 DIAGNOSIS — D509 Iron deficiency anemia, unspecified: Secondary | ICD-10-CM | POA: Diagnosis not present

## 2023-02-11 DIAGNOSIS — N186 End stage renal disease: Secondary | ICD-10-CM | POA: Diagnosis not present

## 2023-02-11 DIAGNOSIS — Z992 Dependence on renal dialysis: Secondary | ICD-10-CM | POA: Diagnosis not present

## 2023-02-12 ENCOUNTER — Encounter (HOSPITAL_COMMUNITY): Payer: Medicare Other

## 2023-02-12 DIAGNOSIS — N250 Renal osteodystrophy: Secondary | ICD-10-CM | POA: Diagnosis not present

## 2023-02-12 DIAGNOSIS — N186 End stage renal disease: Secondary | ICD-10-CM | POA: Diagnosis not present

## 2023-02-12 DIAGNOSIS — D509 Iron deficiency anemia, unspecified: Secondary | ICD-10-CM | POA: Diagnosis not present

## 2023-02-12 DIAGNOSIS — R531 Weakness: Secondary | ICD-10-CM | POA: Diagnosis not present

## 2023-02-12 DIAGNOSIS — D631 Anemia in chronic kidney disease: Secondary | ICD-10-CM | POA: Diagnosis not present

## 2023-02-12 DIAGNOSIS — Z992 Dependence on renal dialysis: Secondary | ICD-10-CM | POA: Diagnosis not present

## 2023-02-12 DIAGNOSIS — R509 Fever, unspecified: Secondary | ICD-10-CM | POA: Diagnosis not present

## 2023-02-13 ENCOUNTER — Encounter (HOSPITAL_COMMUNITY): Payer: Medicare Other | Admitting: Occupational Therapy

## 2023-02-13 DIAGNOSIS — D631 Anemia in chronic kidney disease: Secondary | ICD-10-CM | POA: Diagnosis not present

## 2023-02-13 DIAGNOSIS — N250 Renal osteodystrophy: Secondary | ICD-10-CM | POA: Diagnosis not present

## 2023-02-13 DIAGNOSIS — N186 End stage renal disease: Secondary | ICD-10-CM | POA: Diagnosis not present

## 2023-02-13 DIAGNOSIS — D509 Iron deficiency anemia, unspecified: Secondary | ICD-10-CM | POA: Diagnosis not present

## 2023-02-13 DIAGNOSIS — Z992 Dependence on renal dialysis: Secondary | ICD-10-CM | POA: Diagnosis not present

## 2023-02-14 ENCOUNTER — Inpatient Hospital Stay (HOSPITAL_COMMUNITY)
Admission: EM | Admit: 2023-02-14 | Discharge: 2023-03-02 | Disposition: A | Discharge status: Other Acute Inpatient Hospital | Payer: Medicare Other | Source: Home / Self Care | Attending: Internal Medicine | Admitting: Internal Medicine

## 2023-02-14 ENCOUNTER — Encounter (HOSPITAL_COMMUNITY): Payer: Self-pay | Admitting: Emergency Medicine

## 2023-02-14 ENCOUNTER — Other Ambulatory Visit: Payer: Self-pay

## 2023-02-14 ENCOUNTER — Emergency Department (HOSPITAL_COMMUNITY): Payer: Medicare Other

## 2023-02-14 ENCOUNTER — Encounter (HOSPITAL_COMMUNITY): Payer: Self-pay

## 2023-02-14 ENCOUNTER — Encounter (HOSPITAL_COMMUNITY): Payer: Medicare Other

## 2023-02-14 DIAGNOSIS — D631 Anemia in chronic kidney disease: Secondary | ICD-10-CM | POA: Diagnosis not present

## 2023-02-14 DIAGNOSIS — K650 Generalized (acute) peritonitis: Secondary | ICD-10-CM | POA: Diagnosis not present

## 2023-02-14 DIAGNOSIS — Z77098 Contact with and (suspected) exposure to other hazardous, chiefly nonmedicinal, chemicals: Secondary | ICD-10-CM | POA: Diagnosis present

## 2023-02-14 DIAGNOSIS — R0989 Other specified symptoms and signs involving the circulatory and respiratory systems: Secondary | ICD-10-CM | POA: Diagnosis not present

## 2023-02-14 DIAGNOSIS — R413 Other amnesia: Secondary | ICD-10-CM | POA: Diagnosis present

## 2023-02-14 DIAGNOSIS — G9341 Metabolic encephalopathy: Secondary | ICD-10-CM | POA: Diagnosis not present

## 2023-02-14 DIAGNOSIS — Z833 Family history of diabetes mellitus: Secondary | ICD-10-CM

## 2023-02-14 DIAGNOSIS — I132 Hypertensive heart and chronic kidney disease with heart failure and with stage 5 chronic kidney disease, or end stage renal disease: Secondary | ICD-10-CM | POA: Diagnosis present

## 2023-02-14 DIAGNOSIS — J440 Chronic obstructive pulmonary disease with acute lower respiratory infection: Secondary | ICD-10-CM | POA: Diagnosis present

## 2023-02-14 DIAGNOSIS — N186 End stage renal disease: Secondary | ICD-10-CM | POA: Diagnosis present

## 2023-02-14 DIAGNOSIS — J9601 Acute respiratory failure with hypoxia: Secondary | ICD-10-CM | POA: Diagnosis not present

## 2023-02-14 DIAGNOSIS — N250 Renal osteodystrophy: Secondary | ICD-10-CM | POA: Diagnosis not present

## 2023-02-14 DIAGNOSIS — J40 Bronchitis, not specified as acute or chronic: Secondary | ICD-10-CM | POA: Diagnosis not present

## 2023-02-14 DIAGNOSIS — G4733 Obstructive sleep apnea (adult) (pediatric): Secondary | ICD-10-CM

## 2023-02-14 DIAGNOSIS — Z992 Dependence on renal dialysis: Secondary | ICD-10-CM | POA: Diagnosis not present

## 2023-02-14 DIAGNOSIS — N179 Acute kidney failure, unspecified: Secondary | ICD-10-CM | POA: Diagnosis not present

## 2023-02-14 DIAGNOSIS — R410 Disorientation, unspecified: Secondary | ICD-10-CM | POA: Diagnosis present

## 2023-02-14 DIAGNOSIS — N400 Enlarged prostate without lower urinary tract symptoms: Secondary | ICD-10-CM | POA: Diagnosis not present

## 2023-02-14 DIAGNOSIS — R6520 Severe sepsis without septic shock: Secondary | ICD-10-CM | POA: Diagnosis not present

## 2023-02-14 DIAGNOSIS — Z825 Family history of asthma and other chronic lower respiratory diseases: Secondary | ICD-10-CM

## 2023-02-14 DIAGNOSIS — Z452 Encounter for adjustment and management of vascular access device: Secondary | ICD-10-CM | POA: Diagnosis not present

## 2023-02-14 DIAGNOSIS — K7200 Acute and subacute hepatic failure without coma: Secondary | ICD-10-CM | POA: Diagnosis not present

## 2023-02-14 DIAGNOSIS — D72810 Lymphocytopenia: Secondary | ICD-10-CM | POA: Diagnosis present

## 2023-02-14 DIAGNOSIS — E871 Hypo-osmolality and hyponatremia: Secondary | ICD-10-CM | POA: Diagnosis present

## 2023-02-14 DIAGNOSIS — Z8616 Personal history of COVID-19: Secondary | ICD-10-CM

## 2023-02-14 DIAGNOSIS — Z7902 Long term (current) use of antithrombotics/antiplatelets: Secondary | ICD-10-CM

## 2023-02-14 DIAGNOSIS — I5A Non-ischemic myocardial injury (non-traumatic): Secondary | ICD-10-CM | POA: Diagnosis present

## 2023-02-14 DIAGNOSIS — E11649 Type 2 diabetes mellitus with hypoglycemia without coma: Secondary | ICD-10-CM | POA: Diagnosis not present

## 2023-02-14 DIAGNOSIS — Z961 Presence of intraocular lens: Secondary | ICD-10-CM | POA: Diagnosis present

## 2023-02-14 DIAGNOSIS — Y95 Nosocomial condition: Secondary | ICD-10-CM | POA: Diagnosis present

## 2023-02-14 DIAGNOSIS — D696 Thrombocytopenia, unspecified: Secondary | ICD-10-CM | POA: Diagnosis present

## 2023-02-14 DIAGNOSIS — I5022 Chronic systolic (congestive) heart failure: Secondary | ICD-10-CM | POA: Diagnosis present

## 2023-02-14 DIAGNOSIS — E46 Unspecified protein-calorie malnutrition: Secondary | ICD-10-CM | POA: Diagnosis present

## 2023-02-14 DIAGNOSIS — T8571XA Infection and inflammatory reaction due to peritoneal dialysis catheter, initial encounter: Principal | ICD-10-CM | POA: Diagnosis present

## 2023-02-14 DIAGNOSIS — Y831 Surgical operation with implant of artificial internal device as the cause of abnormal reaction of the patient, or of later complication, without mention of misadventure at the time of the procedure: Secondary | ICD-10-CM | POA: Diagnosis present

## 2023-02-14 DIAGNOSIS — Z4902 Encounter for fitting and adjustment of peritoneal dialysis catheter: Secondary | ICD-10-CM | POA: Diagnosis not present

## 2023-02-14 DIAGNOSIS — A411 Sepsis due to other specified staphylococcus: Secondary | ICD-10-CM | POA: Diagnosis not present

## 2023-02-14 DIAGNOSIS — R0682 Tachypnea, not elsewhere classified: Secondary | ICD-10-CM | POA: Diagnosis not present

## 2023-02-14 DIAGNOSIS — B377 Candidal sepsis: Secondary | ICD-10-CM | POA: Diagnosis present

## 2023-02-14 DIAGNOSIS — Y92239 Unspecified place in hospital as the place of occurrence of the external cause: Secondary | ICD-10-CM | POA: Diagnosis not present

## 2023-02-14 DIAGNOSIS — E876 Hypokalemia: Secondary | ICD-10-CM | POA: Diagnosis present

## 2023-02-14 DIAGNOSIS — Z79899 Other long term (current) drug therapy: Secondary | ICD-10-CM

## 2023-02-14 DIAGNOSIS — Z7982 Long term (current) use of aspirin: Secondary | ICD-10-CM

## 2023-02-14 DIAGNOSIS — I25118 Atherosclerotic heart disease of native coronary artery with other forms of angina pectoris: Secondary | ICD-10-CM | POA: Diagnosis not present

## 2023-02-14 DIAGNOSIS — F419 Anxiety disorder, unspecified: Secondary | ICD-10-CM | POA: Diagnosis present

## 2023-02-14 DIAGNOSIS — J1282 Pneumonia due to coronavirus disease 2019: Secondary | ICD-10-CM | POA: Diagnosis not present

## 2023-02-14 DIAGNOSIS — R609 Edema, unspecified: Secondary | ICD-10-CM | POA: Diagnosis not present

## 2023-02-14 DIAGNOSIS — Z951 Presence of aortocoronary bypass graft: Secondary | ICD-10-CM

## 2023-02-14 DIAGNOSIS — D509 Iron deficiency anemia, unspecified: Secondary | ICD-10-CM | POA: Diagnosis not present

## 2023-02-14 DIAGNOSIS — J189 Pneumonia, unspecified organism: Secondary | ICD-10-CM | POA: Diagnosis not present

## 2023-02-14 DIAGNOSIS — T3695XA Adverse effect of unspecified systemic antibiotic, initial encounter: Secondary | ICD-10-CM | POA: Diagnosis not present

## 2023-02-14 DIAGNOSIS — I4891 Unspecified atrial fibrillation: Secondary | ICD-10-CM | POA: Diagnosis not present

## 2023-02-14 DIAGNOSIS — R0902 Hypoxemia: Secondary | ICD-10-CM | POA: Diagnosis not present

## 2023-02-14 DIAGNOSIS — U071 COVID-19: Secondary | ICD-10-CM | POA: Diagnosis present

## 2023-02-14 DIAGNOSIS — K219 Gastro-esophageal reflux disease without esophagitis: Secondary | ICD-10-CM | POA: Diagnosis present

## 2023-02-14 DIAGNOSIS — J159 Unspecified bacterial pneumonia: Secondary | ICD-10-CM | POA: Diagnosis present

## 2023-02-14 DIAGNOSIS — I959 Hypotension, unspecified: Secondary | ICD-10-CM | POA: Diagnosis not present

## 2023-02-14 DIAGNOSIS — G9340 Encephalopathy, unspecified: Secondary | ICD-10-CM | POA: Diagnosis present

## 2023-02-14 DIAGNOSIS — Z6835 Body mass index (BMI) 35.0-35.9, adult: Secondary | ICD-10-CM

## 2023-02-14 DIAGNOSIS — E785 Hyperlipidemia, unspecified: Secondary | ICD-10-CM | POA: Diagnosis not present

## 2023-02-14 DIAGNOSIS — A419 Sepsis, unspecified organism: Secondary | ICD-10-CM | POA: Diagnosis not present

## 2023-02-14 DIAGNOSIS — I9589 Other hypotension: Secondary | ICD-10-CM | POA: Diagnosis present

## 2023-02-14 DIAGNOSIS — J9690 Respiratory failure, unspecified, unspecified whether with hypoxia or hypercapnia: Secondary | ICD-10-CM | POA: Diagnosis not present

## 2023-02-14 DIAGNOSIS — I38 Endocarditis, valve unspecified: Secondary | ICD-10-CM | POA: Diagnosis not present

## 2023-02-14 DIAGNOSIS — K7460 Unspecified cirrhosis of liver: Secondary | ICD-10-CM | POA: Diagnosis present

## 2023-02-14 DIAGNOSIS — K521 Toxic gastroenteritis and colitis: Secondary | ICD-10-CM | POA: Diagnosis not present

## 2023-02-14 DIAGNOSIS — J90 Pleural effusion, not elsewhere classified: Secondary | ICD-10-CM | POA: Diagnosis not present

## 2023-02-14 DIAGNOSIS — K659 Peritonitis, unspecified: Secondary | ICD-10-CM | POA: Diagnosis not present

## 2023-02-14 DIAGNOSIS — Z7984 Long term (current) use of oral hypoglycemic drugs: Secondary | ICD-10-CM

## 2023-02-14 DIAGNOSIS — I2510 Atherosclerotic heart disease of native coronary artery without angina pectoris: Secondary | ICD-10-CM | POA: Diagnosis present

## 2023-02-14 DIAGNOSIS — R918 Other nonspecific abnormal finding of lung field: Secondary | ICD-10-CM | POA: Diagnosis not present

## 2023-02-14 DIAGNOSIS — N2581 Secondary hyperparathyroidism of renal origin: Secondary | ICD-10-CM | POA: Diagnosis present

## 2023-02-14 DIAGNOSIS — I10 Essential (primary) hypertension: Secondary | ICD-10-CM | POA: Diagnosis not present

## 2023-02-14 DIAGNOSIS — Z9981 Dependence on supplemental oxygen: Secondary | ICD-10-CM

## 2023-02-14 DIAGNOSIS — T85611A Breakdown (mechanical) of intraperitoneal dialysis catheter, initial encounter: Secondary | ICD-10-CM | POA: Diagnosis not present

## 2023-02-14 DIAGNOSIS — J984 Other disorders of lung: Secondary | ICD-10-CM | POA: Diagnosis not present

## 2023-02-14 DIAGNOSIS — R932 Abnormal findings on diagnostic imaging of liver and biliary tract: Secondary | ICD-10-CM | POA: Diagnosis not present

## 2023-02-14 DIAGNOSIS — I6782 Cerebral ischemia: Secondary | ICD-10-CM | POA: Diagnosis not present

## 2023-02-14 DIAGNOSIS — E1165 Type 2 diabetes mellitus with hyperglycemia: Secondary | ICD-10-CM | POA: Diagnosis present

## 2023-02-14 DIAGNOSIS — R000 Tachycardia, unspecified: Secondary | ICD-10-CM | POA: Diagnosis not present

## 2023-02-14 DIAGNOSIS — R059 Cough, unspecified: Secondary | ICD-10-CM | POA: Diagnosis not present

## 2023-02-14 DIAGNOSIS — G253 Myoclonus: Secondary | ICD-10-CM | POA: Diagnosis not present

## 2023-02-14 DIAGNOSIS — A4181 Sepsis due to Enterococcus: Secondary | ICD-10-CM | POA: Diagnosis not present

## 2023-02-14 DIAGNOSIS — I214 Non-ST elevation (NSTEMI) myocardial infarction: Secondary | ICD-10-CM | POA: Diagnosis present

## 2023-02-14 DIAGNOSIS — T82898A Other specified complication of vascular prosthetic devices, implants and grafts, initial encounter: Secondary | ICD-10-CM | POA: Diagnosis not present

## 2023-02-14 DIAGNOSIS — Z794 Long term (current) use of insulin: Secondary | ICD-10-CM

## 2023-02-14 DIAGNOSIS — R7401 Elevation of levels of liver transaminase levels: Secondary | ICD-10-CM | POA: Diagnosis not present

## 2023-02-14 DIAGNOSIS — R0603 Acute respiratory distress: Secondary | ICD-10-CM | POA: Diagnosis not present

## 2023-02-14 DIAGNOSIS — E1122 Type 2 diabetes mellitus with diabetic chronic kidney disease: Secondary | ICD-10-CM | POA: Diagnosis present

## 2023-02-14 DIAGNOSIS — Z781 Physical restraint status: Secondary | ICD-10-CM

## 2023-02-14 DIAGNOSIS — Z7952 Long term (current) use of systemic steroids: Secondary | ICD-10-CM

## 2023-02-14 DIAGNOSIS — Z96651 Presence of right artificial knee joint: Secondary | ICD-10-CM | POA: Diagnosis present

## 2023-02-14 DIAGNOSIS — J449 Chronic obstructive pulmonary disease, unspecified: Secondary | ICD-10-CM | POA: Diagnosis not present

## 2023-02-14 DIAGNOSIS — Z8249 Family history of ischemic heart disease and other diseases of the circulatory system: Secondary | ICD-10-CM

## 2023-02-14 DIAGNOSIS — Z9841 Cataract extraction status, right eye: Secondary | ICD-10-CM

## 2023-02-14 DIAGNOSIS — G4710 Hypersomnia, unspecified: Secondary | ICD-10-CM | POA: Diagnosis present

## 2023-02-14 DIAGNOSIS — I493 Ventricular premature depolarization: Secondary | ICD-10-CM | POA: Diagnosis present

## 2023-02-14 DIAGNOSIS — Z87891 Personal history of nicotine dependence: Secondary | ICD-10-CM

## 2023-02-14 DIAGNOSIS — R4182 Altered mental status, unspecified: Secondary | ICD-10-CM | POA: Diagnosis not present

## 2023-02-14 DIAGNOSIS — G8929 Other chronic pain: Secondary | ICD-10-CM | POA: Diagnosis present

## 2023-02-14 DIAGNOSIS — Z5320 Procedure and treatment not carried out because of patient's decision for unspecified reasons: Secondary | ICD-10-CM | POA: Diagnosis present

## 2023-02-14 DIAGNOSIS — Z7951 Long term (current) use of inhaled steroids: Secondary | ICD-10-CM

## 2023-02-14 DIAGNOSIS — R188 Other ascites: Secondary | ICD-10-CM | POA: Diagnosis not present

## 2023-02-14 DIAGNOSIS — R0602 Shortness of breath: Secondary | ICD-10-CM | POA: Diagnosis not present

## 2023-02-14 DIAGNOSIS — I447 Left bundle-branch block, unspecified: Secondary | ICD-10-CM | POA: Diagnosis present

## 2023-02-14 DIAGNOSIS — Z885 Allergy status to narcotic agent status: Secondary | ICD-10-CM

## 2023-02-14 DIAGNOSIS — Z955 Presence of coronary angioplasty implant and graft: Secondary | ICD-10-CM

## 2023-02-14 DIAGNOSIS — Z8711 Personal history of peptic ulcer disease: Secondary | ICD-10-CM

## 2023-02-14 DIAGNOSIS — Z9842 Cataract extraction status, left eye: Secondary | ICD-10-CM

## 2023-02-14 DIAGNOSIS — B49 Unspecified mycosis: Secondary | ICD-10-CM | POA: Diagnosis not present

## 2023-02-14 MED ORDER — ONDANSETRON HCL 4 MG/2ML IJ SOLN
4.0000 mg | Freq: Once | INTRAMUSCULAR | Status: AC
  Administered 2023-02-15: 4 mg via INTRAVENOUS
  Filled 2023-02-14: qty 2

## 2023-02-14 NOTE — ED Triage Notes (Signed)
Pt complains of productive cough, nausea and vomiting. Fevers on and off x 3 days.

## 2023-02-14 NOTE — ED Triage Notes (Signed)
BIB EMS COVID + x10 days with productive cough. Wife told ems pt had n/v pt denies   EMS Afib on monitor for EMS.   BP 118/64

## 2023-02-15 ENCOUNTER — Encounter (HOSPITAL_COMMUNITY): Payer: Medicare Other | Admitting: Occupational Therapy

## 2023-02-15 ENCOUNTER — Inpatient Hospital Stay (HOSPITAL_COMMUNITY): Payer: Medicare Other

## 2023-02-15 ENCOUNTER — Ambulatory Visit: Payer: Medicare Other | Admitting: Orthopedic Surgery

## 2023-02-15 ENCOUNTER — Encounter (HOSPITAL_COMMUNITY): Payer: Self-pay | Admitting: Family Medicine

## 2023-02-15 ENCOUNTER — Emergency Department (HOSPITAL_COMMUNITY): Payer: Medicare Other

## 2023-02-15 DIAGNOSIS — R0989 Other specified symptoms and signs involving the circulatory and respiratory systems: Secondary | ICD-10-CM | POA: Diagnosis not present

## 2023-02-15 DIAGNOSIS — Y95 Nosocomial condition: Secondary | ICD-10-CM | POA: Diagnosis present

## 2023-02-15 DIAGNOSIS — A4181 Sepsis due to Enterococcus: Secondary | ICD-10-CM | POA: Diagnosis present

## 2023-02-15 DIAGNOSIS — N186 End stage renal disease: Secondary | ICD-10-CM | POA: Diagnosis present

## 2023-02-15 DIAGNOSIS — J9690 Respiratory failure, unspecified, unspecified whether with hypoxia or hypercapnia: Secondary | ICD-10-CM | POA: Diagnosis not present

## 2023-02-15 DIAGNOSIS — Z951 Presence of aortocoronary bypass graft: Secondary | ICD-10-CM | POA: Diagnosis not present

## 2023-02-15 DIAGNOSIS — J159 Unspecified bacterial pneumonia: Secondary | ICD-10-CM | POA: Diagnosis present

## 2023-02-15 DIAGNOSIS — J440 Chronic obstructive pulmonary disease with acute lower respiratory infection: Secondary | ICD-10-CM | POA: Diagnosis present

## 2023-02-15 DIAGNOSIS — J9601 Acute respiratory failure with hypoxia: Secondary | ICD-10-CM | POA: Diagnosis present

## 2023-02-15 DIAGNOSIS — G9340 Encephalopathy, unspecified: Secondary | ICD-10-CM | POA: Diagnosis not present

## 2023-02-15 DIAGNOSIS — I959 Hypotension, unspecified: Secondary | ICD-10-CM | POA: Diagnosis not present

## 2023-02-15 DIAGNOSIS — Z992 Dependence on renal dialysis: Secondary | ICD-10-CM | POA: Diagnosis not present

## 2023-02-15 DIAGNOSIS — U071 COVID-19: Secondary | ICD-10-CM | POA: Diagnosis present

## 2023-02-15 DIAGNOSIS — Z4902 Encounter for fitting and adjustment of peritoneal dialysis catheter: Secondary | ICD-10-CM | POA: Diagnosis not present

## 2023-02-15 DIAGNOSIS — E1122 Type 2 diabetes mellitus with diabetic chronic kidney disease: Secondary | ICD-10-CM | POA: Diagnosis present

## 2023-02-15 DIAGNOSIS — R0902 Hypoxemia: Secondary | ICD-10-CM | POA: Diagnosis not present

## 2023-02-15 DIAGNOSIS — N179 Acute kidney failure, unspecified: Secondary | ICD-10-CM | POA: Diagnosis not present

## 2023-02-15 DIAGNOSIS — R410 Disorientation, unspecified: Secondary | ICD-10-CM | POA: Diagnosis present

## 2023-02-15 DIAGNOSIS — I25118 Atherosclerotic heart disease of native coronary artery with other forms of angina pectoris: Secondary | ICD-10-CM | POA: Diagnosis not present

## 2023-02-15 DIAGNOSIS — K659 Peritonitis, unspecified: Secondary | ICD-10-CM | POA: Diagnosis not present

## 2023-02-15 DIAGNOSIS — Y92239 Unspecified place in hospital as the place of occurrence of the external cause: Secondary | ICD-10-CM | POA: Diagnosis not present

## 2023-02-15 DIAGNOSIS — R0603 Acute respiratory distress: Secondary | ICD-10-CM | POA: Diagnosis not present

## 2023-02-15 DIAGNOSIS — K7200 Acute and subacute hepatic failure without coma: Secondary | ICD-10-CM | POA: Diagnosis not present

## 2023-02-15 DIAGNOSIS — K7460 Unspecified cirrhosis of liver: Secondary | ICD-10-CM | POA: Diagnosis present

## 2023-02-15 DIAGNOSIS — J90 Pleural effusion, not elsewhere classified: Secondary | ICD-10-CM | POA: Diagnosis not present

## 2023-02-15 DIAGNOSIS — B377 Candidal sepsis: Secondary | ICD-10-CM | POA: Diagnosis present

## 2023-02-15 DIAGNOSIS — J449 Chronic obstructive pulmonary disease, unspecified: Secondary | ICD-10-CM | POA: Diagnosis not present

## 2023-02-15 DIAGNOSIS — N400 Enlarged prostate without lower urinary tract symptoms: Secondary | ICD-10-CM | POA: Diagnosis not present

## 2023-02-15 DIAGNOSIS — J984 Other disorders of lung: Secondary | ICD-10-CM | POA: Diagnosis not present

## 2023-02-15 DIAGNOSIS — N250 Renal osteodystrophy: Secondary | ICD-10-CM | POA: Diagnosis not present

## 2023-02-15 DIAGNOSIS — I38 Endocarditis, valve unspecified: Secondary | ICD-10-CM | POA: Diagnosis not present

## 2023-02-15 DIAGNOSIS — R0682 Tachypnea, not elsewhere classified: Secondary | ICD-10-CM | POA: Diagnosis not present

## 2023-02-15 DIAGNOSIS — Z452 Encounter for adjustment and management of vascular access device: Secondary | ICD-10-CM | POA: Diagnosis not present

## 2023-02-15 DIAGNOSIS — I6782 Cerebral ischemia: Secondary | ICD-10-CM | POA: Diagnosis not present

## 2023-02-15 DIAGNOSIS — T8571XA Infection and inflammatory reaction due to peritoneal dialysis catheter, initial encounter: Secondary | ICD-10-CM | POA: Diagnosis present

## 2023-02-15 DIAGNOSIS — K650 Generalized (acute) peritonitis: Secondary | ICD-10-CM | POA: Diagnosis present

## 2023-02-15 DIAGNOSIS — J1282 Pneumonia due to coronavirus disease 2019: Secondary | ICD-10-CM | POA: Diagnosis present

## 2023-02-15 DIAGNOSIS — D696 Thrombocytopenia, unspecified: Secondary | ICD-10-CM | POA: Diagnosis present

## 2023-02-15 DIAGNOSIS — R918 Other nonspecific abnormal finding of lung field: Secondary | ICD-10-CM | POA: Diagnosis not present

## 2023-02-15 DIAGNOSIS — R609 Edema, unspecified: Secondary | ICD-10-CM | POA: Diagnosis not present

## 2023-02-15 DIAGNOSIS — R4182 Altered mental status, unspecified: Secondary | ICD-10-CM | POA: Diagnosis not present

## 2023-02-15 DIAGNOSIS — Y831 Surgical operation with implant of artificial internal device as the cause of abnormal reaction of the patient, or of later complication, without mention of misadventure at the time of the procedure: Secondary | ICD-10-CM | POA: Diagnosis present

## 2023-02-15 DIAGNOSIS — R932 Abnormal findings on diagnostic imaging of liver and biliary tract: Secondary | ICD-10-CM | POA: Diagnosis not present

## 2023-02-15 DIAGNOSIS — B49 Unspecified mycosis: Secondary | ICD-10-CM | POA: Diagnosis not present

## 2023-02-15 DIAGNOSIS — E785 Hyperlipidemia, unspecified: Secondary | ICD-10-CM | POA: Diagnosis not present

## 2023-02-15 DIAGNOSIS — I214 Non-ST elevation (NSTEMI) myocardial infarction: Secondary | ICD-10-CM | POA: Diagnosis not present

## 2023-02-15 DIAGNOSIS — E46 Unspecified protein-calorie malnutrition: Secondary | ICD-10-CM | POA: Diagnosis present

## 2023-02-15 DIAGNOSIS — I10 Essential (primary) hypertension: Secondary | ICD-10-CM | POA: Diagnosis not present

## 2023-02-15 DIAGNOSIS — A411 Sepsis due to other specified staphylococcus: Secondary | ICD-10-CM | POA: Diagnosis present

## 2023-02-15 DIAGNOSIS — G9341 Metabolic encephalopathy: Secondary | ICD-10-CM | POA: Diagnosis present

## 2023-02-15 DIAGNOSIS — I132 Hypertensive heart and chronic kidney disease with heart failure and with stage 5 chronic kidney disease, or end stage renal disease: Secondary | ICD-10-CM | POA: Diagnosis present

## 2023-02-15 DIAGNOSIS — R0602 Shortness of breath: Secondary | ICD-10-CM | POA: Diagnosis not present

## 2023-02-15 DIAGNOSIS — G4733 Obstructive sleep apnea (adult) (pediatric): Secondary | ICD-10-CM | POA: Diagnosis not present

## 2023-02-15 DIAGNOSIS — R188 Other ascites: Secondary | ICD-10-CM | POA: Diagnosis not present

## 2023-02-15 DIAGNOSIS — R6520 Severe sepsis without septic shock: Secondary | ICD-10-CM | POA: Diagnosis present

## 2023-02-15 DIAGNOSIS — Z8616 Personal history of COVID-19: Secondary | ICD-10-CM | POA: Diagnosis not present

## 2023-02-15 DIAGNOSIS — T82898A Other specified complication of vascular prosthetic devices, implants and grafts, initial encounter: Secondary | ICD-10-CM | POA: Diagnosis not present

## 2023-02-15 DIAGNOSIS — R7401 Elevation of levels of liver transaminase levels: Secondary | ICD-10-CM | POA: Diagnosis not present

## 2023-02-15 DIAGNOSIS — A419 Sepsis, unspecified organism: Secondary | ICD-10-CM | POA: Diagnosis not present

## 2023-02-15 DIAGNOSIS — D631 Anemia in chronic kidney disease: Secondary | ICD-10-CM | POA: Diagnosis present

## 2023-02-15 DIAGNOSIS — D72810 Lymphocytopenia: Secondary | ICD-10-CM | POA: Diagnosis present

## 2023-02-15 LAB — ACETAMINOPHEN LEVEL: Acetaminophen (Tylenol), Serum: 26 ug/mL (ref 10–30)

## 2023-02-15 LAB — HEPATITIS PANEL, ACUTE
HCV Ab: NONREACTIVE
Hep A IgM: NONREACTIVE
Hep B C IgM: NONREACTIVE
Hepatitis B Surface Ag: NONREACTIVE

## 2023-02-15 LAB — COMPREHENSIVE METABOLIC PANEL
ALT: 2053 U/L — ABNORMAL HIGH (ref 0–44)
AST: 4152 U/L — ABNORMAL HIGH (ref 15–41)
Albumin: 1.9 g/dL — ABNORMAL LOW (ref 3.5–5.0)
Alkaline Phosphatase: 111 U/L (ref 38–126)
Anion gap: 18 — ABNORMAL HIGH (ref 5–15)
BUN: 49 mg/dL — ABNORMAL HIGH (ref 8–23)
CO2: 24 mmol/L (ref 22–32)
Calcium: 7.8 mg/dL — ABNORMAL LOW (ref 8.9–10.3)
Chloride: 90 mmol/L — ABNORMAL LOW (ref 98–111)
Creatinine, Ser: 13.32 mg/dL — ABNORMAL HIGH (ref 0.61–1.24)
GFR, Estimated: 4 mL/min — ABNORMAL LOW (ref >60)
Glucose, Bld: 126 mg/dL — ABNORMAL HIGH (ref 70–99)
Potassium: 4 mmol/L (ref 3.5–5.1)
Sodium: 132 mmol/L — ABNORMAL LOW (ref 135–145)
Total Bilirubin: 1 mg/dL (ref 0.3–1.2)
Total Protein: 5.9 g/dL — ABNORMAL LOW (ref 6.5–8.1)

## 2023-02-15 LAB — HEPATIC FUNCTION PANEL
ALT: 2456 U/L — ABNORMAL HIGH (ref 0–44)
AST: 5049 U/L — ABNORMAL HIGH (ref 15–41)
Albumin: 2.1 g/dL — ABNORMAL LOW (ref 3.5–5.0)
Alkaline Phosphatase: 99 U/L (ref 38–126)
Bilirubin, Direct: 0.4 mg/dL — ABNORMAL HIGH (ref 0.0–0.2)
Indirect Bilirubin: 0.9 mg/dL (ref 0.3–0.9)
Total Bilirubin: 1.3 mg/dL — ABNORMAL HIGH (ref 0.3–1.2)
Total Protein: 6.4 g/dL — ABNORMAL LOW (ref 6.5–8.1)

## 2023-02-15 LAB — BLOOD GAS, VENOUS
Acid-Base Excess: 0.1 mmol/L (ref 0.0–2.0)
Bicarbonate: 27.6 mmol/L (ref 20.0–28.0)
Drawn by: 44
O2 Saturation: 36.8 %
Patient temperature: 37.6
pCO2, Ven: 58 mmHg (ref 44–60)
pH, Ven: 7.29 (ref 7.25–7.43)
pO2, Ven: <31 mmHg — CL (ref 32–45)

## 2023-02-15 LAB — PROTIME-INR
INR: 1.2 (ref 0.8–1.2)
Prothrombin Time: 15.7 seconds — ABNORMAL HIGH (ref 11.4–15.2)

## 2023-02-15 LAB — PROCALCITONIN: Procalcitonin: >150 ng/mL

## 2023-02-15 LAB — LIPASE, BLOOD: Lipase: 173 U/L — ABNORMAL HIGH (ref 11–51)

## 2023-02-15 LAB — TROPONIN I (HIGH SENSITIVITY)
Troponin I (High Sensitivity): 423 ng/L (ref <18)
Troponin I (High Sensitivity): 573 ng/L (ref <18)

## 2023-02-15 LAB — GLUCOSE, CAPILLARY
Glucose-Capillary: 146 mg/dL — ABNORMAL HIGH (ref 70–99)
Glucose-Capillary: 196 mg/dL — ABNORMAL HIGH (ref 70–99)

## 2023-02-15 LAB — CK: Total CK: 335 U/L (ref 49–397)

## 2023-02-15 LAB — C-REACTIVE PROTEIN: CRP: 19.4 mg/dL — ABNORMAL HIGH (ref <1.0)

## 2023-02-15 LAB — CULTURE, BLOOD (ROUTINE X 2): Special Requests: ADEQUATE

## 2023-02-15 LAB — AMMONIA: Ammonia: 16 umol/L (ref 9–35)

## 2023-02-15 MED ORDER — HYDROCODONE BIT-HOMATROP MBR 5-1.5 MG/5ML PO SOLN: 5.0000 mL | Freq: Four times a day (QID) | ORAL | 0 refills | Status: DC | PRN

## 2023-02-15 MED ORDER — MIDODRINE HCL 5 MG PO TABS
5.0000 mg | ORAL_TABLET | Freq: Three times a day (TID) | ORAL | Status: DC
  Administered 2023-02-16 – 2023-02-24 (×11): 5 mg via ORAL
  Filled 2023-02-15 (×12): qty 1

## 2023-02-15 MED ORDER — SODIUM CHLORIDE 0.9 % IV SOLN
1.0000 g | INTRAVENOUS | Status: DC
  Administered 2023-02-16 – 2023-02-17 (×2): 1 g via INTRAVENOUS
  Filled 2023-02-15 (×3): qty 10

## 2023-02-15 MED ORDER — ASPIRIN 81 MG PO TBEC
81.0000 mg | DELAYED_RELEASE_TABLET | Freq: Every day | ORAL | Status: DC
  Administered 2023-02-15 – 2023-03-02 (×15): 81 mg via ORAL
  Filled 2023-02-15 (×15): qty 1

## 2023-02-15 MED ORDER — AZITHROMYCIN 250 MG PO TABS: 250.0000 mg | ORAL_TABLET | Freq: Every day | ORAL | 0 refills | Status: DC

## 2023-02-15 MED ORDER — GENTAMICIN SULFATE 0.1 % EX CREA
1.0000 | TOPICAL_CREAM | Freq: Every day | CUTANEOUS | Status: DC
  Administered 2023-02-16 – 2023-02-17 (×2): 1 via TOPICAL
  Filled 2023-02-15: qty 15

## 2023-02-15 MED ORDER — ONDANSETRON 8 MG PO TBDP
8.0000 mg | ORAL_TABLET | Freq: Once | ORAL | Status: AC
  Administered 2023-02-15: 8 mg via ORAL
  Filled 2023-02-15: qty 1

## 2023-02-15 MED ORDER — SODIUM CHLORIDE 0.9% FLUSH
3.0000 mL | Freq: Two times a day (BID) | INTRAVENOUS | Status: DC
  Administered 2023-02-15 – 2023-03-02 (×29): 3 mL via INTRAVENOUS

## 2023-02-15 MED ORDER — PREDNISONE 20 MG PO TABS: 40.0000 mg | ORAL_TABLET | Freq: Every day | ORAL | 0 refills | Status: DC

## 2023-02-15 MED ORDER — IPRATROPIUM-ALBUTEROL 0.5-2.5 (3) MG/3ML IN SOLN
9.0000 mL | Freq: Once | RESPIRATORY_TRACT | Status: AC
  Administered 2023-02-15: 9 mL via RESPIRATORY_TRACT
  Filled 2023-02-15: qty 9

## 2023-02-15 MED ORDER — METRONIDAZOLE 500 MG/100ML IV SOLN
500.0000 mg | Freq: Two times a day (BID) | INTRAVENOUS | Status: DC
  Administered 2023-02-15 – 2023-02-16 (×3): 500 mg via INTRAVENOUS
  Filled 2023-02-15 (×3): qty 100

## 2023-02-15 MED ORDER — SEVELAMER CARBONATE 800 MG PO TABS
1600.0000 mg | ORAL_TABLET | Freq: Three times a day (TID) | ORAL | Status: DC
Start: 2023-02-16 — End: 2023-02-15

## 2023-02-15 MED ORDER — SODIUM CHLORIDE 0.9 % IV SOLN: 1.0000 g | INTRAVENOUS | Status: DC

## 2023-02-15 MED ORDER — INSULIN ASPART 100 UNIT/ML IJ SOLN
0.0000 [IU] | INTRAMUSCULAR | Status: DC
  Administered 2023-02-15: 2 [IU] via SUBCUTANEOUS
  Administered 2023-02-15: 1 [IU] via SUBCUTANEOUS
  Administered 2023-02-16: 2 [IU] via SUBCUTANEOUS
  Administered 2023-02-16 (×2): 5 [IU] via SUBCUTANEOUS
  Administered 2023-02-16 (×2): 7 [IU] via SUBCUTANEOUS
  Administered 2023-02-16: 3 [IU] via SUBCUTANEOUS
  Administered 2023-02-17: 1 [IU] via SUBCUTANEOUS
  Administered 2023-02-17: 2 [IU] via SUBCUTANEOUS
  Administered 2023-02-17: 3 [IU] via SUBCUTANEOUS
  Administered 2023-02-17: 2 [IU] via SUBCUTANEOUS
  Administered 2023-02-18: 1 [IU] via SUBCUTANEOUS

## 2023-02-15 MED ORDER — ONDANSETRON 4 MG PO TBDP: ORAL_TABLET | ORAL | 0 refills | Status: DC

## 2023-02-15 MED ORDER — ALBUTEROL SULFATE (2.5 MG/3ML) 0.083% IN NEBU: 2.5000 mg | INHALATION_SOLUTION | RESPIRATORY_TRACT | Status: DC | PRN

## 2023-02-15 MED ORDER — CLOPIDOGREL BISULFATE 75 MG PO TABS
75.0000 mg | ORAL_TABLET | Freq: Every day | ORAL | Status: DC
  Administered 2023-02-15 – 2023-03-02 (×15): 75 mg via ORAL
  Filled 2023-02-15 (×15): qty 1

## 2023-02-15 MED ORDER — HEPARIN SODIUM (PORCINE) 5000 UNIT/ML IJ SOLN
5000.0000 [IU] | Freq: Three times a day (TID) | INTRAMUSCULAR | Status: DC
  Administered 2023-02-15 – 2023-03-02 (×42): 5000 [IU] via SUBCUTANEOUS
  Filled 2023-02-15 (×39): qty 1

## 2023-02-15 MED ORDER — SODIUM CHLORIDE 0.9 % IV SOLN
2.0000 g | INTRAVENOUS | Status: DC
  Administered 2023-02-15: 2 g via INTRAVENOUS
  Filled 2023-02-15: qty 12.5

## 2023-02-15 MED ORDER — MOMETASONE FURO-FORMOTEROL FUM 200-5 MCG/ACT IN AERO
2.0000 | INHALATION_SPRAY | Freq: Two times a day (BID) | RESPIRATORY_TRACT | Status: DC
  Administered 2023-02-16 – 2023-03-02 (×20): 2 via RESPIRATORY_TRACT
  Filled 2023-02-15 (×5): qty 8.8

## 2023-02-15 MED ORDER — METHYLPREDNISOLONE SODIUM SUCC 40 MG IJ SOLR
40.0000 mg | Freq: Two times a day (BID) | INTRAMUSCULAR | Status: DC
  Administered 2023-02-15 – 2023-02-16 (×2): 40 mg via INTRAVENOUS
  Filled 2023-02-15 (×2): qty 1

## 2023-02-15 MED ORDER — SEVELAMER CARBONATE 800 MG PO TABS
800.0000 mg | ORAL_TABLET | Freq: Three times a day (TID) | ORAL | Status: DC
Start: 2023-02-16 — End: 2023-02-15

## 2023-02-15 MED ORDER — SODIUM CHLORIDE 0.9 % IV SOLN
2.0000 g | Freq: Once | INTRAVENOUS | Status: AC
  Administered 2023-02-15: 2 g via INTRAVENOUS
  Filled 2023-02-15: qty 20

## 2023-02-15 MED ORDER — SODIUM CHLORIDE 0.9 % IV SOLN
500.0000 mg | INTRAVENOUS | Status: DC
  Administered 2023-02-15: 500 mg via INTRAVENOUS
  Filled 2023-02-15: qty 5

## 2023-02-15 MED ORDER — IPRATROPIUM-ALBUTEROL 0.5-2.5 (3) MG/3ML IN SOLN
3.0000 mL | Freq: Four times a day (QID) | RESPIRATORY_TRACT | Status: DC
  Administered 2023-02-16 – 2023-02-18 (×9): 3 mL via RESPIRATORY_TRACT
  Filled 2023-02-15 (×10): qty 3

## 2023-02-15 MED ORDER — VANCOMYCIN HCL 2000 MG/400ML IV SOLN
2000.0000 mg | Freq: Once | INTRAVENOUS | Status: AC
  Administered 2023-02-15: 2000 mg via INTRAVENOUS
  Filled 2023-02-15: qty 400

## 2023-02-15 MED ORDER — VANCOMYCIN VARIABLE DOSE PER UNSTABLE RENAL FUNCTION (PHARMACIST DOSING): Status: DC

## 2023-02-15 MED ORDER — PANTOPRAZOLE SODIUM 40 MG PO TBEC
40.0000 mg | DELAYED_RELEASE_TABLET | Freq: Two times a day (BID) | ORAL | Status: DC
  Administered 2023-02-16 – 2023-02-21 (×9): 40 mg via ORAL
  Filled 2023-02-15 (×10): qty 1

## 2023-02-15 MED ORDER — ACETAMINOPHEN 10 MG/ML IV SOLN
1000.0000 mg | Freq: Once | INTRAVENOUS | Status: AC
  Administered 2023-02-15: 1000 mg via INTRAVENOUS

## 2023-02-15 MED ORDER — SEVELAMER CARBONATE 800 MG PO TABS
2400.0000 mg | ORAL_TABLET | Freq: Three times a day (TID) | ORAL | Status: DC
  Administered 2023-02-16 – 2023-02-26 (×13): 2400 mg via ORAL
  Filled 2023-02-15 (×15): qty 3

## 2023-02-15 MED ORDER — METHYLPREDNISOLONE SODIUM SUCC 125 MG IJ SOLR
125.0000 mg | Freq: Once | INTRAMUSCULAR | Status: AC
  Administered 2023-02-15: 125 mg via INTRAVENOUS
  Filled 2023-02-15: qty 2

## 2023-02-15 MED ORDER — DELFLEX-LC/2.5% DEXTROSE 394 MOSM/L IP SOLN: INTRAPERITONEAL | Status: DC

## 2023-02-15 NOTE — H&P (Addendum)
History and Physical    Patient: [name] [name] ZOX:[id number] DOB: 12-28-1950 DOA: 02/14/2023 DOS: the patient was seen and examined on 02/15/2023 PCP: [name] Spar, MD  Patient coming from: Home  Chief Complaint:  Chief Complaint  Patient presents with   Cough   HPI: [name] [name] is a 72 y.o. male with a history of CABG Nov 2023, COPD, IDT2DM, GERD, and ESRD on PD with hypotension on midodrine who was brought to the ED today by wife for confusion and progressive shortness of breath over the past several days. He had been diagnosed with covid-19 10 days PTA but was only treated with augmentin. Has continued to have a productive cough, poor oral intake and more lethargic of late. He denies chest pain, is oriented to location and year but not more specifically. Falls asleep quickly during exam.   Axillary temperature 99.73F, tachypneic, borderline tachycardic with normal BP and hypoxia requiring 2L O2. VBG shows normal pH at 7.29, pCO2 58.  Labs consistent with ESRD with normal bicarb and potassium, elevated anion gap. AST 5k, ALT 2k, TBili 1.3. INR 1.2. BNP 1,479, troponin 423. WBC 9.4k with neutrophilia and lymphopenia. Tylenol level was 26. CXR without infiltrate though subsequent CT abd/pelvis revealed bibasilar pulmonary opacities, hepatic changes suggestive of cirrhosis without biliary dilatation, mild free fluid and peritoneal dialysis catheter noted. Abdominal exam is benign.    Review of Systems: unable to review all systems due to the inability of the patient to answer questions. Past Medical History:  Diagnosis Date   Anemia    Arthritis    Asthma    Bell palsy    CAD (coronary artery disease)    a. 2014 MV: abnl w/ infap ischemia; b. 03/2013 Cath: aneurysmal bleb in the LAD w/ otw nonobs dzs-->Med Rx.   Chronic back pain    Chronic knee pain    a. 09/2015 s/p R TKA.   Chronic pain    Chronic shoulder pain    Chronic sinusitis    COPD (chronic obstructive pulmonary  disease) (HCC)    Diabetes mellitus without complication (HCC)    type II    ESRD on peritoneal dialysis (HCC)    on peritoneal dialysis, [facility]   Essential hypertension    GERD (gastroesophageal reflux disease)    Gout    Gout    Hepatomegaly    noted on noncontrast CT 2015   History of hiatal hernia    Hyperlipidemia    Lateral meniscus tear    Obesity    Truncal   Obstructive sleep apnea    does not use cpap    On home oxygen therapy    uses 2l when is going somewhere per patient    PUD (peptic ulcer disease)    remote, reports f/u EGD about 8 years ago unremarkable    Reactive airway disease    related to exposure to chemical during 9/11   Sinusitis    Vitamin D deficiency    Past Surgical History:  Procedure Laterality Date   ASAD LT SHOULDER  12/15/2008   left shoulder   AV FISTULA PLACEMENT Left 08/09/2016   Procedure: BRACHIOCEPHALIC ARTERIOVENOUS (AV) FISTULA CREATION LEFT ARM;  Surgeon: Sherren Kerns, MD;  Location: [facility] OR;  Service: Vascular;  Laterality: Left;   CAPD INSERTION N/A 10/07/2018   Procedure: LAPAROSCOPIC PERITONEAL CATHETER PLACEMENT;  Surgeon: Rodman Pickle, MD;  Location: [facility] ORS;  Service: General;  Laterality: N/A;   CATARACT EXTRACTION W/PHACO Left  03/28/2016   Procedure: CATARACT EXTRACTION PHACO AND INTRAOCULAR LENS PLACEMENT LEFT EYE;  Surgeon: Jethro Bolus, MD;  Location: [facility] ORS;  Service: Ophthalmology;  Laterality: Left;  CDE: 4.77   CATARACT EXTRACTION W/PHACO Right 04/11/2016   Procedure: CATARACT EXTRACTION PHACO AND INTRAOCULAR LENS PLACEMENT RIGHT EYE; CDE:  4.74;  Surgeon: Jethro Bolus, MD;  Location: [facility] ORS;  Service: Ophthalmology;  Laterality: Right;   COLONOSCOPY  10/15/2008   Fields: Rectal polyp obliterated, not retrieved, hemorrhoids, single ascending colon diverticulum near the CV. Next colonoscopy April 2020   COLONOSCOPY N/A 12/25/2014   SLF: 1. Colorectal polyps (2) removed 2. Small internal hemorrhoids  3. the left colon is severely redundant. hyperplastic polyps   CORONARY ARTERY BYPASS GRAFT N/A 06/05/2022   Procedure: OFF PUMP CORONARY ARTERY BYPASS GRAFTING (CABG) X 2 BYPASSES USING LEFT INTERNAL MAMMARY ARTERY AND RIGHT LEG GREATER SAPHENOUS VEIN HARVESTED ENDOSCOPICALLY;  Surgeon: Corliss Skains, MD;  Location: [facility] OR;  Service: Open Heart Surgery;  Laterality: N/A;   CORONARY STENT INTERVENTION N/A 07/25/2021   Procedure: CORONARY STENT INTERVENTION;  Surgeon: Tonny Bollman, MD;  Location: [facility] INVASIVE CV LAB;  Service: Cardiovascular;  Laterality: N/A;   CORONARY STENT INTERVENTION N/A 12/26/2021   Procedure: CORONARY STENT INTERVENTION;  Surgeon: Yvonne Kendall, MD;  Location: [facility] INVASIVE CV LAB;  Service: Cardiovascular;  Laterality: N/A;   CORONARY STENT INTERVENTION N/A 01/20/2022   Procedure: CORONARY STENT INTERVENTION;  Surgeon: Tonny Bollman, MD;  Location: [facility] INVASIVE CV LAB;  Service: Cardiovascular;  Laterality: N/A;   DOPPLER ECHOCARDIOGRAPHY     ESOPHAGOGASTRODUODENOSCOPY N/A 12/25/2014   SLF: 1. Anemia most likely due to CRI, gastritis, gastric polyps 2. Moderate non-erosive gastriits and mild duodenitis.  3.TWo large gstric polyps removed.    EYE SURGERY  12/22/2010   tear duct probing-[place]   FOREIGN BODY REMOVAL  03/29/2011   Procedure: REMOVAL FOREIGN BODY EXTREMITY;  Surgeon: Fuller Canada, MD;  Location: [facility]S;  Service: Orthopedics;  Laterality: Right;  Removal Foreign Body Right Thumb   IR FLUORO GUIDE CV LINE RIGHT  08/06/2018   IR US GUIDE VASC ACCESS RIGHT  08/06/2018   KNEE ARTHROSCOPY  10/16/2007   left   KNEE ARTHROSCOPY WITH LATERAL MENISECTOMY Right 10/14/2015   Procedure: LEFT KNEE ARTHROSCOPY WITH PARTIAL LATERAL MENISECTOMY;  Surgeon: Vickki Hearing, MD;  Location: [facility];  Service: Orthopedics;  Laterality: Right;   LEFT HEART CATH AND CORONARY ANGIOGRAPHY N/A 07/25/2021   Procedure: LEFT HEART CATH AND CORONARY ANGIOGRAPHY;  Surgeon:  Tonny Bollman, MD;  Location: [facility] INVASIVE CV LAB;  Service: Cardiovascular;  Laterality: N/A;   LEFT HEART CATH AND CORONARY ANGIOGRAPHY N/A 12/26/2021   Procedure: LEFT HEART CATH AND CORONARY ANGIOGRAPHY;  Surgeon: Yvonne Kendall, MD;  Location: [facility] INVASIVE CV LAB;  Service: Cardiovascular;  Laterality: N/A;   LEFT HEART CATH AND CORONARY ANGIOGRAPHY N/A 01/20/2022   Procedure: LEFT HEART CATH AND CORONARY ANGIOGRAPHY;  Surgeon: Tonny Bollman, MD;  Location: [facility] INVASIVE CV LAB;  Service: Cardiovascular;  Laterality: N/A;   LEFT HEART CATHETERIZATION WITH CORONARY ANGIOGRAM N/A 03/28/2013   Procedure: LEFT HEART CATHETERIZATION WITH CORONARY ANGIOGRAM;  Surgeon: Marykay Lex, MD;  Location: [facility] CATH LAB;  Service: Cardiovascular;  Laterality: N/A;   NM MYOVIEW LTD     PENILE PROSTHESIS IMPLANT N/A 08/16/2015   Procedure: PENILE PROTHESIS INFLATABLE, three piece, Excisional biopsy of Penile ulcer, Penile molding;  Surgeon: Jethro Bolus, MD;  Location: [facility] ORS;  Service: Urology;  Laterality: N/A;  PENILE PROSTHESIS IMPLANT N/A 12/24/2017   Procedure: REMOVAL AND  REPLACEMENT  COLOPLAST PENILE PROSTHESIS;  Surgeon: Crista Elliot, MD;  Location: [facility] ORS;  Service: Urology;  Laterality: N/A;   QUADRICEPS TENDON REPAIR  07/21/2011   Procedure: REPAIR QUADRICEP TENDON;  Surgeon: Fuller Canada, MD;  Location: [facility];  Service: Orthopedics;  Laterality: Right;   RIGHT/LEFT HEART CATH AND CORONARY ANGIOGRAPHY N/A 05/30/2022   Procedure: RIGHT/LEFT HEART CATH AND CORONARY ANGIOGRAPHY;  Surgeon: Corky Crafts, MD;  Location: [facility] INVASIVE CV LAB;  Service: Cardiovascular;  Laterality: N/A;   TEE WITHOUT CARDIOVERSION N/A 06/05/2022   Procedure: TRANSESOPHAGEAL ECHOCARDIOGRAM (TEE);  Surgeon: Corliss Skains, MD;  Location: [facility] OR;  Service: Open Heart Surgery;  Laterality: N/A;   TOENAIL EXCISION     removed x2-bilateral   UMBILICAL HERNIA REPAIR  07/17/2005   [place]   Social  History:  reports that he quit smoking about 12 years ago. His smoking use included cigarettes. He started smoking about 37 years ago. He has a 25 pack-year smoking history. He has never used smokeless tobacco. He reports that he does not currently use alcohol. He reports current drug use. Drug: Marijuana.  Allergies  Allergen Reactions   Opana [Oxymorphone Hcl] Itching   Tramadol Itching    Family History  Problem Relation Age of Onset   Hypertension Mother        MI   Cancer Mother        breast    Diabetes Mother    Diabetes Father    Hypertension Father    Hypertension Sister    Diabetes Sister    Arthritis Other    Asthma Other    Lung disease Other    Anesthesia problems Neg Hx    Hypotension Neg Hx    Malignant hyperthermia Neg Hx    Pseudochol deficiency Neg Hx    Colon cancer Neg Hx     Prior to Admission medications   Medication Sig Start Date End Date Taking? Authorizing Provider  azithromycin (ZITHROMAX) 250 MG tablet Take 1 tablet (250 mg total) by mouth daily. Take first 2 tablets together, then 1 every day until finished. 02/15/23  Yes Pollina, Canary Brim, MD  HYDROcodone bit-homatropine (HYCODAN) 5-1.5 MG/5ML syrup Take 5 mLs by mouth every 6 (six) hours as needed for cough. 02/15/23  Yes Gilda Crease, MD  ondansetron (ZOFRAN-ODT) 4 MG disintegrating tablet 4mg  ODT q4 hours prn nausea/vomit 02/15/23  Yes Pollina, Canary Brim, MD  predniSONE (DELTASONE) 20 MG tablet Take 2 tablets (40 mg total) by mouth daily with breakfast. 02/15/23  Yes Pollina, Canary Brim, MD  albuterol (VENTOLIN HFA) 108 (90 Base) MCG/ACT inhaler Inhale 2 puffs into the lungs every 6 (six) hours as needed for wheezing or shortness of breath. 03/25/21   Oretha Milch, MD  ALPRAZolam Prudy Feeler) 0.25 MG tablet Take 1 tablet (0.25 mg total) by mouth at bedtime as needed for anxiety. Take before MRI Patient not taking: Reported on 01/03/2023 11/23/22   Vickki Hearing, MD  aspirin EC 81 MG  tablet Take 81 mg by mouth daily. Swallow whole.    [provider]  atorvastatin (LIPITOR) 80 MG tablet Take 1 tablet (80 mg total) by mouth daily. 06/20/22   Ardelle Balls, PA-C  clopidogrel (PLAVIX) 75 MG tablet Take 1 tablet (75 mg total) by mouth daily. Please take first dose at 9 pm on 12/4 06/19/22   Ardelle Balls, PA-C  DROPLET PEN  NEEDLES 31G X 5 MM MISC USE TO INJECT INSULIN DAILY AS DIRECTED 03/17/22   Nida, Denman George, MD  esomeprazole (NEXIUM) 40 MG capsule Take 1 capsule (40 mg total) by mouth 2 (two) times daily before a meal. 08/01/22 01/28/23  Mahon, Frederik Schmidt, NP  fluticasone (FLONASE) 50 MCG/ACT nasal spray Place 1 spray into both nostrils as needed for allergies or rhinitis. 10/11/22   [provider]  gabapentin (NEURONTIN) 100 MG capsule Take 100 mg by mouth 3 (three) times daily. 08/18/22   [provider]  insulin glargine (LANTUS SOLOSTAR) 100 UNIT/ML Solostar Pen Inject 22 Units into the skin at bedtime. 07/12/22   Dani Gobble, NP  linagliptin (TRADJENTA) 5 MG TABS tablet Take 1 tablet (5 mg total) by mouth daily. 07/12/22   Dani Gobble, NP  midodrine (PROAMATINE) 5 MG tablet Take 5 mg by mouth 3 (three) times daily with meals.    [provider]  [facility] ULTRA test strip USE TO CHECK BLOOD SUGAR TWICE DAILY 03/21/22   Dani Gobble, NP  pantoprazole (PROTONIX) 40 MG tablet Take 40 mg by mouth 2 (two) times daily. 07/26/22   [provider]  sevelamer (RENAGEL) 800 MG tablet Take 2 tablets (1,600 mg total) by mouth 3 (three) times daily with meals. 06/19/22   Doree Fudge M, PA-C  SYMBICORT 160-4.5 MCG/ACT inhaler INHALE 2 PUFFS INTO THE LUNGS TWICE DAILY. 01/08/23   Leslye Peer, MD  Vitamin D, Ergocalciferol, (DRISDOL) 1.25 MG (50000 UNIT) CAPS capsule Take 50,000 Units by mouth every 7 (seven) days. Monday 05/03/21   [provider]    Physical Exam: Vitals:   02/15/23 0800  02/15/23 0810 02/15/23 0945 02/15/23 1000  BP: (!) 146/99  110/75 104/78  Pulse: 83  80 86  Resp: (!) 26  (!) 25 20  Temp:      TempSrc:      SpO2: 99% 97% 97% 98%  Weight:      Height:      Gen: Older gentleman laying quietly in no acute distress Pulm: Expiratory wheezing in all lung fields, no inspiratory crackles, nonlabored with supplemental oxygen  CV: RRR, no MRG, no pitting edema GI: Soft, NT, ND, +BS, PD cath site c/d/I, no erythema or discharge. Not at all tender. Neuro: Rousable and follows single questions, answer appropriately but minimally, MAE. No new focal deficits. Ext: Warm, no deformities. Dry.  Skin: No new rashes, lesions or ulcers on visualized skin   Data Reviewed: ECG: LBBB, QTc .  VBG shows normal pH at 7.29, pCO2 58.  Labs consistent with ESRD with normal bicarb and potassium, elevated anion gap. AST 5k, ALT 2k, TBili 1.3. INR 1.2. BNP 1,479, troponin 423. WBC 9.4k with neutrophilia and lymphopenia. Tylenol level was 26. CXR without infiltrate though subsequent CT abd/pelvis revealed bibasilar pulmonary opacities, hepatic changes suggestive of cirrhosis without biliary dilatation, mild free fluid and peritoneal dialysis catheter noted.  Assessment and Plan: Acute metabolic encephalopathy: ?due to infection, coincides with covid-19 diagnosis. CT head nonacute. He moves all extremities. Note evaluation in Feb 2024 for memory loss with neurology, Dr. Terrace Arabia, so there is some chronic component. - Start lactulose given hepatic dysfunction - Monitor neuro checks q4h, delirium precautions.  - Currently maintaining airway.  - Consider MRI if not improving.  - Hold xanax, gabapentin.   AST > ALT elevation: No biliary dilatation on CT. Lipase 173, no pancreatic inflammation on CT or abdominal tenderness.  - RUQ U/S -  Acute hepatitis panel.  - ?if toxic insult we haven't identified, tylenol level was detectable but low.  UDS pending. - GI consulted by EDP, Dr.  Tasia Catchings.  - Hold statin.Check CK.  Acute hypoxic respiratory failure, multifocal pneumonia:  - Start IV steroids given persistent wheezing, this would also help later covid.  - Continue scheduled and prn nebs - History of covid-19 infection (cause of lymphopenia most likely), suspect superimposed bacterial PNA. Will check CRP and PCT - Send peritoneal fluid sample for gram stain and culture - PCT >150, fever up >102F after admission order placed, pt w/rigors. Broaden abx to Vancomycin/cefepime/flagyl. RN reports we're unable to get peritoneal culture until he gets a bed at [facility]. Upgraded level of care to progressive.  - Continue monitoring blood cultures, get sputum culture if possible  CAD s/p CABG nov 2023 with demand myocardial ischemia.  - Trend troponin to confirm peak, though suspect without chest pain troponin elevation (which appears chronic) represents demand ischemia due to respiratory distress in ESRD patient. ECG uninterpretable with chronic LBBB.  ESRD:  - Nephrology consulted. Will arrange for PD while inpatient, needs to be at [facility] for this. - Normally on midodrine TID, will reorder but hold if BP elevated.   IDT2DM: Med rec reports lantus and tradjenta at home, I was unable to confirm this at admission and med rec not yet completed.  - Give sensitive SSI for now and monitor.   Thrombocytopenia: Had low platelets in March as well.  - Given relatively recent CABG, will continue DAPT and monitor CBC  Morbid obesity: Body mass index is 35.51 kg/m.    Advance Care Planning: Full code presumed.   Consults: Nephrology, GI  Family Communication: None at bedside  Severity of Illness: The appropriate patient status for this patient is INPATIENT. Inpatient status is judged to be reasonable and necessary in order to provide the required intensity of service to ensure the patient's safety. The patient's presenting symptoms, physical exam findings, and initial radiographic and  laboratory data in the context of their chronic comorbidities is felt to place them at high risk for further clinical deterioration. Furthermore, it is not anticipated that the patient will be medically stable for discharge from the hospital within 2 midnights of admission.   * I certify that at the point of admission it is my clinical judgment that the patient will require inpatient hospital care spanning beyond 2 midnights from the point of admission due to high intensity of service, high risk for further deterioration and high frequency of surveillance required.*  Author: Tyrone Nine, MD 02/15/2023 10:19 AM  For on call review [email or web address].

## 2023-02-15 NOTE — ED Notes (Signed)
LVM for wife with room number at [facility]

## 2023-02-15 NOTE — ED Notes (Signed)
Report given to Paramedic Makayla Pt able to stand and pivot to EMS stretcher

## 2023-02-15 NOTE — ED Notes (Signed)
Medicated per MAR Pt remains awake and alert Vitals listed  Gave ice water

## 2023-02-15 NOTE — Progress Notes (Signed)
Pharmacy Antibiotic Note - Initial [name] [name] is a 72 y.o. male admitted on 02/14/2023 with  sepsis . Possibly intra-abdominal or pulmonary source. Pharmacy has been consulted for cefepime and vancomycin dosing. Transferred from [facility] ED to [facility]. ESRD on PD prior to admission, to resume inpatient. Received vancomycin 2g IV and cefepime 2g IV today (8/1).  Plan: Adjust cefepime to 1g IV Q24H Obtain vancomycin level in 3-4 days (8/4 or 8/5) Redose vancomycin once level < 20 Flagyl per MD F/u nephrology recs, culture data, MRSA swab, and LOT  Height: 5\' 6"  (167.6 cm) Weight: 99.8 kg (220 lb) IBW/kg (Calculated) : 63.8  Temp (24hrs), Avg:99.5 F (37.5 C), Min:97.9 F (36.6 C), Max:102.5 F (39.2 C)  Recent Labs  Lab 02/15/23 0006 02/15/23 0827  WBC 9.4  --   CREATININE 12.73* 13.32*    Estimated Creatinine Clearance: 5.5 mL/min (A) (by C-G formula based on SCr of 13.32 mg/dL (H)).    Allergies  Allergen Reactions   Opana [Oxymorphone Hcl] Itching   Tramadol Itching    Antimicrobials this admission: Cefepime 8/1 >> Vancomycin x1 8/1 >> Flagyl 8/1 >> Azithro 8/1 x 1 dose CTX 8/1  x 1 dose  Dose adjustments this admission: Cefepime 2g Q24H >> 1g Q24H  Microbiology results: 8/1 Bcx at [facility]: pending Peritoneal cx: needs to be collected MRSA PCR: needs to be collected Sputum cx: needs to be collected  Thank you for allowing pharmacy to be a part of this patient's care.  Nicole Kindred, PharmD PGY1 Pharmacy Resident 02/15/2023 5:24 PM

## 2023-02-15 NOTE — ED Provider Notes (Addendum)
[place] [facility] AT [facility] Provider Note   CSN: [id number] Arrival date & time: 02/14/23  2128     History  Chief Complaint  Patient presents with   Cough    [name] [name] is a 72 y.o. male.  Patient presents to the emergency department for evaluation of cough.  He tested positive for COVID 10 days ago.  He reports that he is continuing to have a productive cough.  He denies shortness of breath, he does use oxygen at home, has a history of asthma/COPD.  Patient reports that he has been having nausea and vomiting, has not been able to hold much down over the last couple of days.       Home Medications Prior to Admission medications   Medication Sig Start Date End Date Taking? Authorizing Provider  azithromycin (ZITHROMAX) 250 MG tablet Take 1 tablet (250 mg total) by mouth daily. Take first 2 tablets together, then 1 every day until finished. 02/15/23  Yes Gilda Crease, MD  ondansetron (ZOFRAN-ODT) 4 MG disintegrating tablet 4mg  ODT q4 hours prn nausea/vomit 02/15/23  Yes Junie Engram, Canary Brim, MD  predniSONE (DELTASONE) 20 MG tablet Take 2 tablets (40 mg total) by mouth daily with breakfast. 02/15/23  Yes Shaquetta Arcos, Canary Brim, MD  albuterol (VENTOLIN HFA) 108 (90 Base) MCG/ACT inhaler Inhale 2 puffs into the lungs every 6 (six) hours as needed for wheezing or shortness of breath. 03/25/21   Oretha Milch, MD  ALPRAZolam Prudy Feeler) 0.25 MG tablet Take 1 tablet (0.25 mg total) by mouth at bedtime as needed for anxiety. Take before MRI Patient not taking: Reported on 01/03/2023 11/23/22   Vickki Hearing, MD  aspirin EC 81 MG tablet Take 81 mg by mouth daily. Swallow whole.    [provider]  atorvastatin (LIPITOR) 80 MG tablet Take 1 tablet (80 mg total) by mouth daily. 06/20/22   Ardelle Balls, PA-C  clopidogrel (PLAVIX) 75 MG tablet Take 1 tablet (75 mg total) by mouth daily. Please take first dose at 9 pm on 12/4 06/19/22    Ardelle Balls, PA-C  DROPLET PEN NEEDLES 31G X 5 MM MISC USE TO INJECT INSULIN DAILY AS DIRECTED 03/17/22   Roma Kayser, MD  esomeprazole (NEXIUM) 40 MG capsule Take 1 capsule (40 mg total) by mouth 2 (two) times daily before a meal. 08/01/22 01/28/23  Mahon, Frederik Schmidt, NP  fluticasone (FLONASE) 50 MCG/ACT nasal spray Place 1 spray into both nostrils as needed for allergies or rhinitis. 10/11/22   [provider]  gabapentin (NEURONTIN) 100 MG capsule Take 100 mg by mouth 3 (three) times daily. 08/18/22   [provider]  insulin glargine (LANTUS SOLOSTAR) 100 UNIT/ML Solostar Pen Inject 22 Units into the skin at bedtime. 07/12/22   Dani Gobble, NP  linagliptin (TRADJENTA) 5 MG TABS tablet Take 1 tablet (5 mg total) by mouth daily. 07/12/22   Dani Gobble, NP  midodrine (PROAMATINE) 5 MG tablet Take 5 mg by mouth 3 (three) times daily with meals.    [provider]  [facility] ULTRA test strip USE TO CHECK BLOOD SUGAR TWICE DAILY 03/21/22   Dani Gobble, NP  pantoprazole (PROTONIX) 40 MG tablet Take 40 mg by mouth 2 (two) times daily. 07/26/22   [provider]  sevelamer (RENAGEL) 800 MG tablet Take 2 tablets (1,600 mg total) by mouth 3 (three) times daily with meals. 06/19/22   Ardelle Balls, PA-C  SYMBICORT 160-4.5 MCG/ACT inhaler INHALE 2 PUFFS INTO THE LUNGS TWICE DAILY. 01/08/23   Leslye Peer, MD  Vitamin D, Ergocalciferol, (DRISDOL) 1.25 MG (50000 UNIT) CAPS capsule Take 50,000 Units by mouth every 7 (seven) days. Monday 05/03/21   [provider]      Allergies    Opana [oxymorphone hcl] and Tramadol    Review of Systems   Review of Systems  Physical Exam Updated Vital Signs BP 121/81   Pulse 92   Temp 98.8 F (37.1 C) (Oral)   Resp 16   Ht 5\' 6"  (1.676 m)   Wt 99.8 kg   SpO2 93%   BMI 35.51 kg/m  Physical Exam Vitals and nursing note reviewed.  Constitutional:      General: He is not in acute  distress.    Appearance: He is well-developed.  HENT:     Head: Normocephalic and atraumatic.     Mouth/Throat:     Mouth: Mucous membranes are moist.  Eyes:     General: Vision grossly intact. Gaze aligned appropriately.     Extraocular Movements: Extraocular movements intact.     Conjunctiva/sclera: Conjunctivae normal.  Cardiovascular:     Rate and Rhythm: Normal rate and regular rhythm.     Pulses: Normal pulses.     Heart sounds: Normal heart sounds, S1 normal and S2 normal. No murmur heard.    No friction rub. No gallop.  Pulmonary:     Effort: Pulmonary effort is normal. No respiratory distress.     Breath sounds: Normal breath sounds.  Abdominal:     Palpations: Abdomen is soft.     Tenderness: There is no abdominal tenderness. There is no guarding or rebound.     Hernia: No hernia is present.  Musculoskeletal:        General: No swelling.     Cervical back: Full passive range of motion without pain, normal range of motion and neck supple. No pain with movement, spinous process tenderness or muscular tenderness. Normal range of motion.     Right lower leg: No edema.     Left lower leg: No edema.  Skin:    General: Skin is warm and dry.     Capillary Refill: Capillary refill takes less than 2 seconds.     Findings: No ecchymosis, erythema, lesion or wound.  Neurological:     Mental Status: He is alert and oriented to person, place, and time.     GCS: GCS eye subscore is 4. GCS verbal subscore is 5. GCS motor subscore is 6.     Cranial Nerves: Cranial nerves 2-12 are intact.     Sensory: Sensation is intact.     Motor: Motor function is intact. No weakness or abnormal muscle tone.     Coordination: Coordination is intact.  Psychiatric:        Mood and Affect: Mood normal.        Speech: Speech normal.        Behavior: Behavior normal.     ED Results / Procedures / Treatments   Labs (all labs ordered are listed, but only abnormal results are displayed) Labs  Reviewed  CBC WITH DIFFERENTIAL/PLATELET - Abnormal; Notable for the following components:      Result Value   Hemoglobin 12.2 (*)    RDW 16.0 (*)    Platelets 139 (*)    nRBC 0.6 (*)    Neutro Abs 9.2 (*)    Lymphs Abs 0.2 (*)  Monocytes Absolute 0.0 (*)    nRBC 2 (*)    All other components within normal limits  BASIC METABOLIC PANEL - Abnormal; Notable for the following components:   Sodium 132 (*)    Chloride 89 (*)    Glucose, Bld 147 (*)    BUN 43 (*)    Creatinine, Ser 12.73 (*)    Calcium 8.1 (*)    GFR, Estimated 4 (*)    Anion gap 20 (*)    All other components within normal limits  BRAIN NATRIURETIC PEPTIDE - Abnormal; Notable for the following components:   B Natriuretic Peptide 1,479.0 (*)    All other components within normal limits    EKG None  Radiology DG Chest 2 View  Result Date: 02/14/2023 CLINICAL DATA:  Cough EXAM: CHEST - 2 VIEW COMPARISON:  Chest x-ray 09/16/2022 FINDINGS: Sternotomy wires are again seen. The heart size and mediastinal contours are within normal limits. Both lungs are clear. The visualized skeletal structures are unremarkable. IMPRESSION: No active cardiopulmonary disease. Electronically Signed   By: Darliss Cheney M.D.   On: 02/14/2023 23:30    Procedures Procedures    Medications Ordered in ED Medications  ondansetron (ZOFRAN) injection 4 mg (4 mg Intravenous Given 02/15/23 0004)    ED Course/ Medical Decision Making/ A&P                                 Medical Decision Making Amount and/or Complexity of Data Reviewed Labs: ordered. Radiology: ordered.  Risk Prescription drug management.   Differential Diagnosis considered includes, but not limited to: COVID-19; influenza; RSV; simple viral URI; pneumonia  Resents to the emergency department for productive cough.  Symptoms ongoing for 10 days since he was diagnosed with COVID.  Patient also has a history of COPD.  He does use bronchodilators and inhaled steroids.   Oxygen saturations are adequate here in the ED.  Chest x-ray is clear, no evidence of pneumonia.  No overt heart failure.  Electrolytes normal.  Patient is a dialysis patient, does not require emergent dialysis.  Patient treated with Zofran, no vomiting here.  Will discharge with treatment for bronchitis/asthma/COPD exacerbation, nausea, follow-up with PCP.  Return if symptoms worsen.   Addendum: Wife has finally called June and reports that she thinks that he has been confused since he got COVID 10 days ago.  She reports that he is not himself.  He seemed to be answering questions appropriately during evaluation here.  Will order CT head and some additional test, wife will come up and determine if he is altered.  Review of records reveals that he was evaluated in neurology in February of this year for memory loss.  Suspect this is multifactorial.   Final Clinical Impression(s) / ED Diagnoses Final diagnoses:  Bronchitis    Rx / DC Orders ED Discharge Orders          Ordered    azithromycin (ZITHROMAX) 250 MG tablet  Daily        02/15/23 0155    predniSONE (DELTASONE) 20 MG tablet  Daily with breakfast        02/15/23 0155    ondansetron (ZOFRAN-ODT) 4 MG disintegrating tablet        02/15/23 0155              Gilda Crease, MD 02/15/23 0155    Gilda Crease, MD 02/15/23 [phone]  Gilda Crease, MD 02/15/23 [phone]

## 2023-02-15 NOTE — ED Notes (Signed)
Report given to [facility]. ETA 15 mins.

## 2023-02-15 NOTE — ED Notes (Signed)
EDP in with pt at this time.

## 2023-02-15 NOTE — Consult Note (Signed)
[facility] Renal Consultation Note  Indication for Consultation:  Management of ESRD/hemodialysis; anemia, hypertension/volume and secondary hyperparathyroidism  HPI: [name] [name] is a 72 y.o. male  with HO  ESRD ON CCPD (Followed  by [facility]) ,CAD, Cabg  Nov 2023 , DM T2 ID, Copd, Gerd , Hypotension on Midodrine who was brought to the [facility] ER by his wife for confusion and progressive shortness of breath over the past several days.  He was diagnosed with COVID about 10 days ago and treated only with Augmentin.  His wife reports continued productive cough with decreased PO intake and becoming more Lethargic.  At [facility], he was oriented on presentation to location and year but not much else specifically and fell asleep during the admitting team exam.  CT head demonstrated to acute process and had moderate atrophy.  CT abdomen pelvis demonstrated multiple patchy airspace opacities concerning for multifocal pneumonia.  Scan was also suggestive of possible hepatic cirrhosis.  There was a mild amount of free fluid noted in the pelvis and his PD catheter was noted in the left lower quadrant.  He had stable mild prostatic enlargement.  He was transferred to [facility] for further management given the requirement for peritoneal dialysis.  He has received ceftriaxone, azithromycin, flagyl, Solu-Medrol, and is now on vancomycin and cefepime.  Blood cultures were drawn and pending.  The patient reports that PD has been going fine.  Fluid is clear and no abdominal pain.  He reports breathing is doing ok and is more conversant/oriented with me than apparently he was previously.  He uses two 2.5 % bags and one 1.5% bag.  PD orders obtained by my colleague earlier today - patient states that 3 liters sounds high for fill volume - he thinks maybe 2.5 liters.  Last bag fill of ICO "purple bag" is 1.5 liters which matches below.  He barely makes any urine.  States he takes three  renvela, not two.    Past Medical History:  Diagnosis Date   Anemia    Arthritis    Asthma    Bell palsy    CAD (coronary artery disease)    a. 2014 MV: abnl w/ infap ischemia; b. 03/2013 Cath: aneurysmal bleb in the LAD w/ otw nonobs dzs-->Med Rx.   Chronic back pain    Chronic knee pain    a. 09/2015 s/p R TKA.   Chronic pain    Chronic shoulder pain    Chronic sinusitis    COPD (chronic obstructive pulmonary disease) (HCC)    Diabetes mellitus without complication (HCC)    type II    ESRD on peritoneal dialysis (HCC)    on peritoneal dialysis, [facility]   Essential hypertension    GERD (gastroesophageal reflux disease)    Gout    Gout    Hepatomegaly    noted on noncontrast CT 2015   History of hiatal hernia    Hyperlipidemia    Lateral meniscus tear    Obesity    Truncal   Obstructive sleep apnea    does not use cpap    On home oxygen therapy    uses 2l when is going somewhere per patient    PUD (peptic ulcer disease)    remote, reports f/u EGD about 8 years ago unremarkable    Reactive airway disease    related to exposure to chemical during 9/11   Sinusitis    Vitamin D deficiency  Past Surgical History:  Procedure Laterality Date   ASAD LT SHOULDER  12/15/2008   left shoulder   AV FISTULA PLACEMENT Left 08/09/2016   Procedure: BRACHIOCEPHALIC ARTERIOVENOUS (AV) FISTULA CREATION LEFT ARM;  Surgeon: Sherren Kerns, MD;  Location: [facility] OR;  Service: Vascular;  Laterality: Left;   CAPD INSERTION N/A 10/07/2018   Procedure: LAPAROSCOPIC PERITONEAL CATHETER PLACEMENT;  Surgeon: Rodman Pickle, MD;  Location: [facility] ORS;  Service: General;  Laterality: N/A;   CATARACT EXTRACTION W/PHACO Left 03/28/2016   Procedure: CATARACT EXTRACTION PHACO AND INTRAOCULAR LENS PLACEMENT LEFT EYE;  Surgeon: Jethro Bolus, MD;  Location: [facility] ORS;  Service: Ophthalmology;  Laterality: Left;  CDE: 4.77   CATARACT EXTRACTION W/PHACO Right 04/11/2016   Procedure: CATARACT  EXTRACTION PHACO AND INTRAOCULAR LENS PLACEMENT RIGHT EYE; CDE:  4.74;  Surgeon: Jethro Bolus, MD;  Location: [facility] ORS;  Service: Ophthalmology;  Laterality: Right;   COLONOSCOPY  10/15/2008   Fields: Rectal polyp obliterated, not retrieved, hemorrhoids, single ascending colon diverticulum near the CV. Next colonoscopy April 2020   COLONOSCOPY N/A 12/25/2014   SLF: 1. Colorectal polyps (2) removed 2. Small internal hemorrhoids 3. the left colon is severely redundant. hyperplastic polyps   CORONARY ARTERY BYPASS GRAFT N/A 06/05/2022   Procedure: OFF PUMP CORONARY ARTERY BYPASS GRAFTING (CABG) X 2 BYPASSES USING LEFT INTERNAL MAMMARY ARTERY AND RIGHT LEG GREATER SAPHENOUS VEIN HARVESTED ENDOSCOPICALLY;  Surgeon: Corliss Skains, MD;  Location: [facility] OR;  Service: Open Heart Surgery;  Laterality: N/A;   CORONARY STENT INTERVENTION N/A 07/25/2021   Procedure: CORONARY STENT INTERVENTION;  Surgeon: Tonny Bollman, MD;  Location: [facility] INVASIVE CV LAB;  Service: Cardiovascular;  Laterality: N/A;   CORONARY STENT INTERVENTION N/A 12/26/2021   Procedure: CORONARY STENT INTERVENTION;  Surgeon: Yvonne Kendall, MD;  Location: [facility] INVASIVE CV LAB;  Service: Cardiovascular;  Laterality: N/A;   CORONARY STENT INTERVENTION N/A 01/20/2022   Procedure: CORONARY STENT INTERVENTION;  Surgeon: Tonny Bollman, MD;  Location: [facility] INVASIVE CV LAB;  Service: Cardiovascular;  Laterality: N/A;   DOPPLER ECHOCARDIOGRAPHY     ESOPHAGOGASTRODUODENOSCOPY N/A 12/25/2014   SLF: 1. Anemia most likely due to CRI, gastritis, gastric polyps 2. Moderate non-erosive gastriits and mild duodenitis.  3.TWo large gstric polyps removed.    EYE SURGERY  12/22/2010   tear duct probing-[place]   FOREIGN BODY REMOVAL  03/29/2011   Procedure: REMOVAL FOREIGN BODY EXTREMITY;  Surgeon: Fuller Canada, MD;  Location: [facility] ORS;  Service: Orthopedics;  Laterality: Right;  Removal Foreign Body Right Thumb   IR FLUORO GUIDE CV LINE RIGHT  08/06/2018   IR  US GUIDE VASC ACCESS RIGHT  08/06/2018   KNEE ARTHROSCOPY  10/16/2007   left   KNEE ARTHROSCOPY WITH LATERAL MENISECTOMY Right 10/14/2015   Procedure: LEFT KNEE ARTHROSCOPY WITH PARTIAL LATERAL MENISECTOMY;  Surgeon: Vickki Hearing, MD;  Location: [facility] ORS;  Service: Orthopedics;  Laterality: Right;   LEFT HEART CATH AND CORONARY ANGIOGRAPHY N/A 07/25/2021   Procedure: LEFT HEART CATH AND CORONARY ANGIOGRAPHY;  Surgeon: Tonny Bollman, MD;  Location: [facility] INVASIVE CV LAB;  Service: Cardiovascular;  Laterality: N/A;   LEFT HEART CATH AND CORONARY ANGIOGRAPHY N/A 12/26/2021   Procedure: LEFT HEART CATH AND CORONARY ANGIOGRAPHY;  Surgeon: Yvonne Kendall, MD;  Location: [facility] INVASIVE CV LAB;  Service: Cardiovascular;  Laterality: N/A;   LEFT HEART CATH AND CORONARY ANGIOGRAPHY N/A 01/20/2022   Procedure: LEFT HEART CATH AND CORONARY ANGIOGRAPHY;  Surgeon: Tonny Bollman, MD;  Location: [facility] INVASIVE CV LAB;  Service: Cardiovascular;  Laterality: N/A;   LEFT HEART CATHETERIZATION WITH CORONARY ANGIOGRAM N/A 03/28/2013   Procedure: LEFT HEART CATHETERIZATION WITH CORONARY ANGIOGRAM;  Surgeon: Marykay Lex, MD;  Location: [facility] CATH LAB;  Service: Cardiovascular;  Laterality: N/A;   NM MYOVIEW LTD     PENILE PROSTHESIS IMPLANT N/A 08/16/2015   Procedure: PENILE PROTHESIS INFLATABLE, three piece, Excisional biopsy of Penile ulcer, Penile molding;  Surgeon: Jethro Bolus, MD;  Location: [facility] ORS;  Service: Urology;  Laterality: N/A;   PENILE PROSTHESIS IMPLANT N/A 12/24/2017   Procedure: REMOVAL AND  REPLACEMENT  COLOPLAST PENILE PROSTHESIS;  Surgeon: Crista Elliot, MD;  Location: [facility] ORS;  Service: Urology;  Laterality: N/A;   QUADRICEPS TENDON REPAIR  07/21/2011   Procedure: REPAIR QUADRICEP TENDON;  Surgeon: Fuller Canada, MD;  Location: [facility]S;  Service: Orthopedics;  Laterality: Right;   RIGHT/LEFT HEART CATH AND CORONARY ANGIOGRAPHY N/A 05/30/2022   Procedure: RIGHT/LEFT HEART CATH AND  CORONARY ANGIOGRAPHY;  Surgeon: Corky Crafts, MD;  Location: [facility] INVASIVE CV LAB;  Service: Cardiovascular;  Laterality: N/A;   TEE WITHOUT CARDIOVERSION N/A 06/05/2022   Procedure: TRANSESOPHAGEAL ECHOCARDIOGRAM (TEE);  Surgeon: Corliss Skains, MD;  Location: [facility] OR;  Service: Open Heart Surgery;  Laterality: N/A;   TOENAIL EXCISION     removed x2-bilateral   UMBILICAL HERNIA REPAIR  07/17/2005   [place]      Family History  Problem Relation Age of Onset   Hypertension Mother        MI   Cancer Mother        breast    Diabetes Mother    Diabetes Father    Hypertension Father    Hypertension Sister    Diabetes Sister    Arthritis Other    Asthma Other    Lung disease Other    Anesthesia problems Neg Hx    Hypotension Neg Hx    Malignant hyperthermia Neg Hx    Pseudochol deficiency Neg Hx    Colon cancer Neg Hx       reports that he quit smoking about 12 years ago. His smoking use included cigarettes. He started smoking about 37 years ago. He has a 25 pack-year smoking history. He has never used smokeless tobacco. He reports that he does not currently use alcohol. He reports current drug use. Drug: Marijuana.   Allergies  Allergen Reactions   Opana [Oxymorphone Hcl] Itching   Tramadol Itching    Prior to Admission medications   Medication Sig Start Date End Date Taking? Authorizing Provider  azithromycin (ZITHROMAX) 250 MG tablet Take 1 tablet (250 mg total) by mouth daily. Take first 2 tablets together, then 1 every day until finished. 02/15/23  Yes Pollina, Canary Brim, MD  HYDROcodone bit-homatropine (HYCODAN) 5-1.5 MG/5ML syrup Take 5 mLs by mouth every 6 (six) hours as needed for cough. 02/15/23  Yes Gilda Crease, MD  ondansetron (ZOFRAN-ODT) 4 MG disintegrating tablet 4mg  ODT q4 hours prn nausea/vomit 02/15/23  Yes Pollina, Canary Brim, MD  predniSONE (DELTASONE) 20 MG tablet Take 2 tablets (40 mg total) by mouth daily with breakfast. 02/15/23  Yes  Pollina, Canary Brim, MD  albuterol (VENTOLIN HFA) 108 (90 Base) MCG/ACT inhaler Inhale 2 puffs into the lungs every 6 (six) hours as needed for wheezing or shortness of breath. 03/25/21   Oretha Milch, MD  ALPRAZolam Prudy Feeler) 0.25 MG tablet Take 1 tablet (0.25 mg total) by mouth at bedtime as needed for anxiety. Take before  MRI Patient not taking: Reported on 01/03/2023 11/23/22   Vickki Hearing, MD  aspirin EC 81 MG tablet Take 81 mg by mouth daily. Swallow whole.    [provider]  atorvastatin (LIPITOR) 80 MG tablet Take 1 tablet (80 mg total) by mouth daily. 06/20/22   Ardelle Balls, PA-C  clopidogrel (PLAVIX) 75 MG tablet Take 1 tablet (75 mg total) by mouth daily. Please take first dose at 9 pm on 12/4 06/19/22   Ardelle Balls, PA-C  DROPLET PEN NEEDLES 31G X 5 MM MISC USE TO INJECT INSULIN DAILY AS DIRECTED 03/17/22   Roma Kayser, MD  esomeprazole (NEXIUM) 40 MG capsule Take 1 capsule (40 mg total) by mouth 2 (two) times daily before a meal. 08/01/22 01/28/23  Mahon, Frederik Schmidt, NP  fluticasone (FLONASE) 50 MCG/ACT nasal spray Place 1 spray into both nostrils as needed for allergies or rhinitis. 10/11/22   [provider]  gabapentin (NEURONTIN) 100 MG capsule Take 100 mg by mouth 3 (three) times daily. 08/18/22   [provider]  insulin glargine (LANTUS SOLOSTAR) 100 UNIT/ML Solostar Pen Inject 22 Units into the skin at bedtime. 07/12/22   Dani Gobble, NP  linagliptin (TRADJENTA) 5 MG TABS tablet Take 1 tablet (5 mg total) by mouth daily. 07/12/22   Dani Gobble, NP  midodrine (PROAMATINE) 5 MG tablet Take 5 mg by mouth 3 (three) times daily with meals.    [provider]  [facility] ULTRA test strip USE TO CHECK BLOOD SUGAR TWICE DAILY 03/21/22   Dani Gobble, NP  pantoprazole (PROTONIX) 40 MG tablet Take 40 mg by mouth 2 (two) times daily. 07/26/22   [provider]  sevelamer (RENAGEL) 800 MG tablet Take 2  tablets (1,600 mg total) by mouth 3 (three) times daily with meals. 06/19/22   Doree Fudge M, PA-C  SYMBICORT 160-4.5 MCG/ACT inhaler INHALE 2 PUFFS INTO THE LUNGS TWICE DAILY. 01/08/23   Leslye Peer, MD  Vitamin D, Ergocalciferol, (DRISDOL) 1.25 MG (50000 UNIT) CAPS capsule Take 50,000 Units by mouth every 7 (seven) days. Monday 05/03/21   [provider]    I have reviewed the patient's current and reported prior to admission medications.  Lab Results  Component Value Date   NA 132 (L) 02/15/2023   K 4.0 02/15/2023   CO2 24 02/15/2023   GLUCOSE 126 (H) 02/15/2023   BUN 49 (H) 02/15/2023   CREATININE 13.32 (H) 02/15/2023   CALCIUM 7.8 (L) 02/15/2023   EGFR 3 (L) 12/22/2021   GFRNONAA 4 (L) 02/15/2023     ROS:  Comprehensive review of systems is negative except as noted above    Physical Exam: Vitals:   02/15/23 1530 02/15/23 1600  BP: 116/84 107/77  Pulse: 78 90  Resp: (!) 25 (!) 30  Temp:    SpO2: 96% 95%     Physical exam:   General adult male in bed in no acute distress HEENT normocephalic atraumatic extraocular movements intact sclera anicteric Neck supple trachea midline Lungs clear to auscultation bilaterally normal work of breathing at rest on 2 liters oxygen Heart regular rate  Abdomen soft nontender distended/obese habitus and nontender Extremities no pitting edema  Psych normal mood and affect Neuro - alert and oriented to person, year, and location; provides some basic history Access LUE AVF with thrill; PD catheter    Outpatient PD orders:  Followed by [facility] (note his PD RN was on vacation  but  nephrology called [place]) for orders earlier today)  CCPD 5 cycles, 3000 fill vol (note pt states on consult he thinks is 2.5 liters?) Total time: 10 hrs Last fill 1500 cc Meds: Mircera 150 mcg q monthly Venofer 100mg  once a month   Assessment/Plan  # ESRD  - Continue PD here - CCPD  -  Will obtain cell count and culture however appears less likely peritonitis by hx - He states that he thinks fill volume is 2.5 liters which is what I have used for this evening.  No last bag fill while here  - Will do PD with all 2.5% dextrose this evening  - Called dialysis unit and appreciate their assistance  # Covid PNA - new oxygen requirement noted - per primary team   # CAP  - antibiotics per primary team  - superimposed bacterial PNA in the setting of covid   # Confusion/encephalopathy  - Setting of covid and superimposed bacterial PNA  # Chronic hypotension  - on midodrine - continue   # Transaminitis  - persistent, setting of covid, chronic hypotension - note that abdominal imaging is suggestive of possible cirrhosis, as well, and decreased hepatic reserve   # Anemia of CKD  - mild and Hb 12.2 - above goal  - on ESA monthly outpatient as above; defer ESA here for now  # Metabolic bone disease  - continue home renvela  - phos in AM  Thank you for the consult.  Please do not hesitate to contact me with any questions regarding the patient   Estanislado Emms, MD 02/15/2023 7:02 PM

## 2023-02-15 NOTE — Progress Notes (Signed)
A consult was received from an ED and patient transferring to [facility]. Vancomycin 2gm IV and Cefepime 2gm IV x 1  for sepsis per pharmacy dosing.  The patient's profile has been reviewed for ht/wt/allergies/indication/available labs.   A one time order has been placed for Vancomycin and cefepime.  Further antibiotics/pharmacy consults should be ordered by admitting physician if indicated.                       Thank you, Tera Mater 02/15/2023  12:56 PM

## 2023-02-15 NOTE — ED Provider Notes (Signed)
  Physical Exam  BP 104/78   Pulse 86   Temp 99.7 F (37.6 C) (Axillary)   Resp 20   Ht 5\' 6"  (1.676 m)   Wt 99.8 kg   SpO2 98%   BMI 35.51 kg/m   Physical Exam Constitutional:      General: He is not in acute distress.    Appearance: Normal appearance. He is ill-appearing.  HENT:     Head: Normocephalic and atraumatic.     Nose: No congestion or rhinorrhea.  Eyes:     General:        Right eye: No discharge.        Left eye: No discharge.     Extraocular Movements: Extraocular movements intact.     Pupils: Pupils are equal, round, and reactive to light.  Cardiovascular:     Rate and Rhythm: Normal rate and regular rhythm.     Heart sounds: No murmur heard. Pulmonary:     Effort: No respiratory distress.     Breath sounds: Wheezing present. No rales.  Abdominal:     General: There is no distension.     Tenderness: There is no abdominal tenderness.  Musculoskeletal:        General: Normal range of motion.     Cervical back: Normal range of motion.  Skin:    General: Skin is warm and dry.  Neurological:     General: No focal deficit present.     Mental Status: He is alert.     Procedures  Procedures  ED Course / MDM    Medical Decision Making Amount and/or Complexity of Data Reviewed Labs: ordered. Radiology: ordered.  Risk Prescription drug management. Decision regarding hospitalization.   Patient received in handoff.  Recent COVID-19 infection with shortness of breath, new oxygen requirement and altered mental status.  Pending laboratory evaluation and CT.  CT reassuring but labs showing a very significant increase in the patient's LFTs in the thousands.  This was hemolyzed so I repeated it but this does appear to be correct.  Ammonia is normal.  Tylenol level normal.  INR is normal.  CT abdomen pelvis without acute findings but does show some ascites.  I spoke with the nephrologist on-call Dr. Thedore Mins who will help with peritoneal dialysis over at [facility].  I spoke with the gastroenterologist on-call who is recommending right upper quadrant ultrasound, lactulose and hepatology evaluation at [facility].  Given normal INR he has lower suspicion for fulminant liver failure.  However, given patient's altered mental status, he does recommend lactulose as ammonia levels are not shown to correlate well with level of hepatic encephalopathy.  Patient then admitted to the hospitalist       Glendora Score, MD 02/15/23 1015

## 2023-02-15 NOTE — Progress Notes (Signed)
Informed of patient in ER. P/W cough, positive for COVID 10 days ago. Now with AMS and elevated LFT's-to be evaluated. Patient is ESRD on PD and will be transferred to [facility]. Full consult to follow. Called [facility] clinic, RN on vacation. Called central [place] location Cheree Ditto) for orders therefore full orders not known as of now Outpatient orders: CCPD 5 cycles, 3000 fill vol Total time: 10 hrs Last fill 1500 cc Meds: Mircera 150 mcg q monthly Venofer 100mg  once a month  Plan: -Anticipate CCPD tonight -Will be seen at [facility], full consult to follow -Please call with any questions/concerns  Anthony Sar, MD [facility]

## 2023-02-15 NOTE — ED Notes (Signed)
Lab drew blood cultures x2 Vitals assessed Pt alert and talking in full sentences  Pt stated he does dialysis MWF and PD every day of the week .

## 2023-02-15 NOTE — ED Notes (Signed)
Unable to collect UA, pt stated that he does not make any urine

## 2023-02-15 NOTE — ED Notes (Signed)
IV APAP completed Pt no longer shaking and is alert and looking around

## 2023-02-16 ENCOUNTER — Encounter (HOSPITAL_COMMUNITY): Payer: Medicare Other

## 2023-02-16 ENCOUNTER — Inpatient Hospital Stay (HOSPITAL_COMMUNITY): Payer: Medicare Other

## 2023-02-16 DIAGNOSIS — G9340 Encephalopathy, unspecified: Secondary | ICD-10-CM | POA: Diagnosis not present

## 2023-02-16 LAB — GLUCOSE, CAPILLARY
Glucose-Capillary: 200 mg/dL — ABNORMAL HIGH (ref 70–99)
Glucose-Capillary: 213 mg/dL — ABNORMAL HIGH (ref 70–99)
Glucose-Capillary: 251 mg/dL — ABNORMAL HIGH (ref 70–99)
Glucose-Capillary: 275 mg/dL — ABNORMAL HIGH (ref 70–99)
Glucose-Capillary: 321 mg/dL — ABNORMAL HIGH (ref 70–99)
Glucose-Capillary: 337 mg/dL — ABNORMAL HIGH (ref 70–99)

## 2023-02-16 LAB — C-REACTIVE PROTEIN: CRP: 18.8 mg/dL — ABNORMAL HIGH (ref <1.0)

## 2023-02-16 LAB — PROCALCITONIN: Procalcitonin: >150 ng/mL

## 2023-02-16 LAB — BRAIN NATRIURETIC PEPTIDE: B Natriuretic Peptide: 826.8 pg/mL — ABNORMAL HIGH (ref 0.0–100.0)

## 2023-02-16 MED ORDER — INSULIN GLARGINE-YFGN 100 UNIT/ML ~~LOC~~ SOLN: 12.0000 [IU] | Freq: Every day | SUBCUTANEOUS | Status: DC

## 2023-02-16 MED ORDER — HEPARIN SODIUM (PORCINE) 1000 UNIT/ML IJ SOLN
INTRAPERITONEAL | Status: DC | PRN
  Filled 2023-02-16 (×2): qty 6000

## 2023-02-16 MED ORDER — INSULIN GLARGINE-YFGN 100 UNIT/ML ~~LOC~~ SOLN
15.0000 [IU] | Freq: Every day | SUBCUTANEOUS | Status: DC
  Administered 2023-02-16 – 2023-02-19 (×3): 15 [IU] via SUBCUTANEOUS
  Filled 2023-02-16 (×5): qty 0.15

## 2023-02-16 NOTE — Evaluation (Signed)
Physical Therapy Evaluation Patient Details Name: [name] [name] MRN: [id number] DOB: 01-09-1951 Today's Date: 02/16/2023  History of Present Illness  Pt is a 72 y/o male admitted 7/31 for confusion and progressive SOB, did not bounce back well from recent Covid-19.  PMH  CABG, 11/23, COPD, IDT2DM, ESRD on PD  Clinical Impression  Pt admitted with/for the problems stated above.  Pt in not quite at baseline due to general weakness, but improving per wife.  Pt currently limited functionally due to the problems listed below.  (see problems list.)  Pt will benefit from PT to maximize function and safety to be able to get home safely with available assist.         If plan is discharge home, recommend the following: A little help with bathing/dressing/bathroom;A little help with walking and/or transfers;Assistance with cooking/housework;Help with stairs or ramp for entrance   Can travel by private vehicle        Equipment Recommendations None recommended by PT  Recommendations for Other Services       Functional Status Assessment Patient has had a recent decline in their functional status and/or demonstrates limited ability to make significant improvements in function in a reasonable and predictable amount of time     Precautions / Restrictions Precautions Precautions: None      Mobility  Bed Mobility Overal bed mobility:  (OOB on arrival, returned to sitting EOB for bath)                  Transfers Overall transfer level: Needs assistance   Transfers: Sit to/from Stand Sit to Stand: Min guard           General transfer comment: used UE's appropriately, no assist needed, pt report mild general weakness    Ambulation/Gait Ambulation/Gait assistance: Min guard Gait Distance (Feet): 50 Feet (with RW in confined space limited by PD) Assistive device: Rolling walker (2 wheels) Gait Pattern/deviations: Step-through pattern, Decreased step length - right, Decreased step  length - left, Decreased stride length   Gait velocity interpretation: <1.8 ft/sec, indicate of risk for recurrent falls   General Gait Details: pt asked to use RW due to feeling weak having not been allow to work in [employer]	[place] day  Acupuncturist Bed    Modified Rankin (Stroke Patients Only)       Balance Overall balance assessment: Needs assistance Sitting-balance support: No upper extremity supported, Feet supported Sitting balance-Leahy Scale: Good       Standing balance-Leahy Scale: Fair                               Pertinent Vitals/Pain Pain Assessment Pain Assessment: No/denies pain    Home Living Family/patient expects to be discharged to:: Private residence Living Arrangements: Spouse/significant other Available Help at Discharge: Family;Available 24 hours/day Type of Home: House Home Access: Stairs to enter Entrance Stairs-Rails: Left Entrance Stairs-Number of Steps: 1   Home Layout: One level;Laundry or work area in [employer] Equipment: Agricultural consultant (2 wheels);Crutches;BSC/3in1      Prior Function Prior Level of Function : Independent/Modified Independent;Driving             Mobility Comments: independent mobility ADLs Comments: independent ADLs, light IADLs, driving, manages meds     Hand Dominance   Dominant Hand: Right    Extremity/Trunk Assessment  Lower Extremity Assessment Lower Extremity Assessment: Generalized weakness    Cervical / Trunk Assessment Cervical / Trunk Assessment: Normal  Communication   Communication: No difficulties  Cognition Arousal/Alertness: Awake/alert Behavior During Therapy: WFL for tasks assessed/performed Overall Cognitive Status: Within Functional Limits for tasks assessed                                          General Comments      Exercises     Assessment/Plan    PT Assessment Patient needs continued PT services   PT Problem List Decreased strength;Decreased activity tolerance;Decreased mobility;Decreased balance       PT Treatment Interventions Gait training;Stair training;Functional mobility training;Therapeutic activities;Balance training;Patient/family education    PT Goals (Current goals can be found in the Care Plan section)  Acute Rehab PT Goals Patient Stated Goal: back home after problems addressed PT Goal Formulation: With patient Time For Goal Achievement: 02/23/23 Potential to Achieve Goals: Good    Frequency Min 1X/week     Co-evaluation               AM-PAC PT "6 Clicks" Mobility  Outcome Measure Help needed turning from your back to your side while in a flat bed without using bedrails?: A Little Help needed moving from lying on your back to sitting on the side of a flat bed without using bedrails?: A Little Help needed moving to and from a bed to a chair (including a wheelchair)?: A Little Help needed standing up from a chair using your arms (e.g., wheelchair or bedside chair)?: A Little Help needed to walk in hospital room?: A Little Help needed climbing 3-5 steps with a railing? : A Lot 6 Click Score: 17    End of Session   Activity Tolerance: Patient tolerated treatment well Patient left: in bed;with call bell/phone within reach;with family/visitor present (sitting EOB) Nurse Communication: Mobility status PT Visit Diagnosis: Muscle weakness (generalized) (M62.81);Unsteadiness on feet (R26.81)    Time: 1478-2956 PT Time Calculation (min) (ACUTE ONLY): 28 min   Charges:   PT Evaluation $PT Eval Moderate Complexity: 1 Mod PT Treatments $Gait Training: 8-22 mins PT General Charges $$ ACUTE PT VISIT: 1 Visit         02/16/2023  Jacinto Halim., PT Acute Rehabilitation Services [phone]  (office)  Eliseo Gum  02/16/2023, 12:06 PM

## 2023-02-16 NOTE — Progress Notes (Signed)
Subjective: Awoken from sleep, denies abdominal pain, asking for food, alert and oriented  Objective Vital signs in last 24 hours: Vitals:   02/16/23 0700 02/16/23 0739 02/16/23 0800 02/16/23 1150  BP: 112/81  (!) 86/62 (!) 87/71  Pulse:      Resp: 16  16 17   Temp: 98.9 F (37.2 C) 98.9 F (37.2 C)  98.7 F (37.1 C)  TempSrc: Axillary   Oral  SpO2: 97%     Weight:      Height:       Weight change: 1.309 kg  Physical Exam: General: Adult male in bed in NAD pleasant Heart: RRR no MRG Lungs: CTA, nonlabored breathing room air Abdomen: NABS, soft NT, ND, PD catheter site dressing present dry Extremities: No pedal edema Dialysis Access: PD cath present LUA AVF positive thrill   Outpatient PD orders:  Followed by [facility] (note his PD RN was on vacation  but nephrology called [place] location Cheree Ditto) for orders earlier today)   CCPD 5 cycles, 3000 fill vol (note pt states on consult he thinks is 2.5 liters?) Total time: 10 hrs Last fill 1500 cc Meds: Mircera 150 mcg q monthly Venofer 100mg  once a month  Problem/Plan:   Peritonitis = cell count 2200 , hazy fluid on PD this morning, continue current antibiotics = vancomycin, cefepime until culture back  ESRD -continue PD here-CCPD use 2.5% bags this evening Metabolic encephalopathy due to sepsis from peritonitis/multifocal pneumonia /recent COVID not on restrictions currently-plan per admit team-mental status vastly improving on IV antibiotics-  Acute hide toxic respiratory failure multifocal pneumonia -O2 sat normal now  HTN/volume -no excess volume, on midodrine chronically /some low BPs asymptomatic this morning Transaminitis -workup per admitting,persistent, setting of covid, chronic hypotension- note that abdominal imaging is suggestive of possible cirrhosis, as well, and decreased hepatic reserve  Anemia -of CKD-Hgb 10.6 <12.2 no ESA needed currently monitor Secondary hyperparathyroidism  -corrected calcium 10, no vitamin D, phosphorus 8.2, Renvela binder starting    Lenny Pastel, PA-C [facility] Beeper [phone] 02/16/2023,11:56 AM  LOS: 1 day   Labs: Basic Metabolic Panel: Recent Labs  Lab 02/15/23 0006 02/15/23 0827 02/16/23 0117  NA 132* 132* 130*  K 5.0 4.0 4.3  CL 89* 90* 91*  CO2 23 24 19*  GLUCOSE 147* 126* 284*  BUN 43* 49* 57*  CREATININE 12.73* 13.32* 14.71*  CALCIUM 8.1* 7.8* 7.2*  PHOS  --   --  8.2*   Liver Function Tests: Recent Labs  Lab 02/15/23 0006 02/15/23 0827 02/16/23 0117  AST 5,049* 4,152* 2,628*  ALT 2,456* 2,053* 1,431*  ALKPHOS 99 111 99  BILITOT 1.3* 1.0 0.8  PROT 6.4* 5.9* 5.3*  ALBUMIN 2.1* 1.9* <1.5*   Recent Labs  Lab 02/15/23 0006  LIPASE 173*   Recent Labs  Lab 02/15/23 0827  AMMONIA 16   CBC: Recent Labs  Lab 02/15/23 0006 02/16/23 0117  WBC 9.4 5.1  NEUTROABS 9.2* 5.1  HGB 12.2* 10.6*  HCT 39.1 33.3*  MCV 87.7 86.7  PLT 139* 133*   Cardiac Enzymes: Recent Labs  Lab 02/15/23 1110  CKTOTAL 335   CBG: Recent Labs  Lab 02/15/23 1708 02/15/23 2052 02/16/23 0041 02/16/23 0321 02/16/23 0741  GLUCAP 146* 196* 251* 321* 275*    Studies/Results: DG Chest Port 1 View  Result Date: 02/16/2023 CLINICAL DATA:  Shortness of breath. COPD and end-stage kidney disease. EXAM: PORTABLE CHEST 1 VIEW COMPARISON:  02/14/23 FINDINGS: Stable cardiac enlargement. Status  post median sternotomy and CABG procedure. Blunting of the left costophrenic angle is concerning for small effusion. Bilateral mid and lower lung zone hazy opacities are identified compatible with multifocal pneumonia as noted on CT from prior day. Visualized osseous structures are unremarkable. IMPRESSION: 1. Bilateral mid and lower lung zone hazy opacities compatible with multifocal pneumonia as noted on CT from prior day. 2. Small left pleural effusion. Electronically Signed   By: Signa Kell M.D.   On: 02/16/2023 07:06   US  Abdomen Limited RUQ (LIVER/GB)  Result Date: 02/15/2023 CLINICAL DATA:  Transaminitis. EXAM: ULTRASOUND ABDOMEN LIMITED RIGHT UPPER QUADRANT COMPARISON:  CT scan of same day. FINDINGS: Gallbladder: No gallstones or wall thickening visualized. No sonographic Murphy sign noted by sonographer. Possible 3 mm polyp. Common bile duct: Diameter: 3 mm which is within normal limits. Liver: No focal lesion identified. Heterogeneous echotexture of hepatic parenchyma is noted with nodular contours consistent with hepatic cirrhosis. Portal vein is patent on color Doppler imaging with normal direction of blood flow towards the liver. Other: Mild ascites is noted around the liver. IMPRESSION: Hepatic cirrhosis with mild surrounding ascites. Possible 3 mm gallbladder polyp. Electronically Signed   By: Lupita Raider M.D.   On: 02/15/2023 10:31   CT ABDOMEN PELVIS WO CONTRAST  Result Date: 02/15/2023 CLINICAL DATA:  Epigastric abdominal pain. EXAM: CT ABDOMEN AND PELVIS WITHOUT CONTRAST TECHNIQUE: Multidetector CT imaging of the abdomen and pelvis was performed following the standard protocol without IV contrast. RADIATION DOSE REDUCTION: This exam was performed according to the departmental dose-optimization program which includes automated exposure control, adjustment of the mA and/or kV according to patient size and/or use of iterative reconstruction technique. COMPARISON:  September 27, 2022. FINDINGS: Lower chest: Multiple patchy opacities are noted in both lower lobes concerning for multifocal pneumonia. Hepatobiliary: No cholelithiasis or biliary dilatation is noted. Stable probable hepatic cysts. Slightly nodular hepatic contour is noted suggesting possible hepatic cirrhosis. Pancreas: Unremarkable. No pancreatic ductal dilatation or surrounding inflammatory changes. Spleen: Normal in size without focal abnormality. Adrenals/Urinary Tract: Adrenal glands appear normal. Bilateral renal atrophy is noted consistent with  history of end-stage renal disease. Grossly stable high density material is noted within both intrarenal collecting systems as well as in the proximal ureters. It is uncertain if these represent calcifications or residual contrast material. Stable complex cystic lesion seen involving lower pole of left kidney for which no further follow-up is required. Urinary bladder is decompressed. Stomach/Bowel: There is no evidence of bowel obstruction or inflammation. Stomach is unremarkable. The appendix is not clearly visualized. Vascular/Lymphatic: Aortic atherosclerosis. No enlarged abdominal or pelvic lymph nodes. Reproductive: Stable mild prostatic enlargement. Penile reservoir is noted in right lower quadrant. Other: Peritoneal dialysis catheter is noted with tip in left lower quadrant. Mild amount of free fluid is noted in the abdomen and pelvis, but most prominently seen in right upper quadrant. Musculoskeletal: No acute or significant osseous findings. IMPRESSION: Multiple patchy airspace opacities are seen in visualized lung bases concerning for multifocal pneumonia. Bilateral renal atrophy is noted consistent with history of end-stage renal disease. Grossly stable high density material is noted within both nondilated intrarenal collecting systems as well as proximal ureters, concerning for either calcifications or residual contrast material as described on prior exam. Probable hepatic cirrhosis. Peritoneal dialysis catheter tip is noted in left lower quadrant. Mild amount of free fluid is noted in the abdomen and pelvis, but most prominently seen in right upper quadrant. Stable mild prostatic enlargement. Aortic Atherosclerosis (ICD10-I70.0).  Electronically Signed   By: Lupita Raider M.D.   On: 02/15/2023 09:04   CT HEAD WO CONTRAST ( )  Result Date: 02/15/2023 CLINICAL DATA:  Mental status change with unknown cause.  COVID EXAM: CT HEAD WITHOUT CONTRAST TECHNIQUE: Contiguous axial images were obtained from  the base of the skull through the vertex without intravenous contrast. RADIATION DOSE REDUCTION: This exam was performed according to the departmental dose-optimization program which includes automated exposure control, adjustment of the mA and/or kV according to patient size and/or use of iterative reconstruction technique. COMPARISON:  None Available. FINDINGS: Brain: No evidence of acute infarction, hemorrhage, hydrocephalus, extra-axial collection or mass lesion/mass effect. Generalized atrophy. Mild chronic small vessel ischemia in the cerebral white matter. Vascular: No hyperdense vessel or unexpected calcification. Skull: Normal. Negative for fracture or focal lesion. Sinuses/Orbits: Remote blowout fracture at the right orbit. IMPRESSION: 1. No acute or reversible finding. 2. Moderate atrophy. Electronically Signed   By: Tiburcio Pea M.D.   On: 02/15/2023 07:14   DG Chest 2 View  Result Date: 02/14/2023 CLINICAL DATA:  Cough EXAM: CHEST - 2 VIEW COMPARISON:  Chest x-ray 09/16/2022 FINDINGS: Sternotomy wires are again seen. The heart size and mediastinal contours are within normal limits. Both lungs are clear. The visualized skeletal structures are unremarkable. IMPRESSION: No active cardiopulmonary disease. Electronically Signed   By: Darliss Cheney M.D.   On: 02/14/2023 23:30   Medications:  ceFEPime (MAXIPIME) IV     dialysis solution 2.5% low-MG/low-CA     metronidazole Stopped (02/16/23 0254)    aspirin EC  81 mg Oral Daily   clopidogrel  75 mg Oral Daily   gentamicin cream  1 Application Topical Daily   heparin  5,000 Units Subcutaneous Q8H   insulin aspart  0-9 Units Subcutaneous Q4H   insulin glargine-yfgn  15 Units Subcutaneous Daily   ipratropium-albuterol  3 mL Nebulization Q6H   midodrine  5 mg Oral TID WC   mometasone-formoterol  2 puff Inhalation BID   pantoprazole  40 mg Oral BID   sevelamer carbonate  2,400 mg Oral TID WC   sodium chloride flush  3 mL Intravenous Q12H    vancomycin variable dose per unstable renal function (pharmacist dosing)   Does not apply See admin instructions

## 2023-02-16 NOTE — Care Management (Signed)
Transition of Care [facility]) - Inpatient Brief Assessment   Patient Details  Name: [name] [name] MRN: [id number] Date of Birth: 1950/07/19  [facility] of [facility]) CM/SW Contact:    Lockie Pares, RN Phone Number: 02/16/2023, 8:33 AM   Clinical Narrative:  Presented with pneumonia with COVID 10 days ago. History of ESRD, does PD at home. High readmission risk due to multiple chronic conditions. No needs ID at this time Last had home health in 2023 with [facility]  Transition of Care Asessment: Insurance and Status: Insurance coverage has been reviewed Patient has primary care physician: Yes Home environment has been reviewed: yes Prior level of function:: Independent   Social Determinants of Health Reivew: SDOH reviewed no interventions necessary Readmission risk has been reviewed: Yes (30% high readmission risk) Transition of care needs: no transition of care needs at this time

## 2023-02-16 NOTE — Procedures (Signed)
I was present at this dialysis session, have reviewed the session itself and made  appropriate changes  PD treatment running Fluid mildly hazy Abd slightly TTP - no rebound or guarding  Estill Bakes MD [facility] pager [phone]   02/16/2023, 12:42 PM

## 2023-02-16 NOTE — Progress Notes (Signed)
Ok to stop flagyl per Lenny Pastel.  Ulyses Southward, PharmD, BCIDP, AAHIVP, CPP Infectious Disease Pharmacist 02/16/2023 1:47 PM

## 2023-02-16 NOTE — Progress Notes (Signed)
PROGRESS NOTE                                                                                                                                                                                                             Patient Demographics:    [name] [name], is a 72 y.o. male, DOB - [birth year], ZOX:[id number]  Outpatient Primary MD for the patient is Benetta Spar, MD    LOS - 1  Admit date - 02/14/2023    Chief Complaint  Patient presents with   Cough       Brief Narrative (HPI from H&P)   72 y.o. male with a history of CABG Nov 2023, COPD, IDT2DM, GERD, and ESRD on PD with hypotension on midodrine who was brought to the ED today by wife for confusion and progressive shortness of breath over the past several days. He had been diagnosed with covid-19 10 days PTA but was only treated with augmentin.  He presented to [facility] with lethargy, initially was thought to have pneumonia further workup suggests he has not SBP from his PD catheter.  Transferred to [name] for further care.   Subjective:    [name] [name] today has, No headache, No chest pain, No abdominal pain - No Nausea, No new weakness tingling or numbness, no SOB.   Assessment  & Plan :   Acute metabolic encephalopathy due to sepsis from peritonitis: Due to peritonitis in a patient with ESRD on peritoneal dialysis with catheter present on admission.  Currently responding very well to empiric IV antibiotics which will be continued, follow PD fluid culture results and blood cultures, sepsis pathophysiology has resolved, mentation close to baseline.  No headache, head CT unremarkable.  Continue to monitor.  Continue to hold Xanax and Neurontin.   Symptomatic transaminitis with stable bilirubin and alk phos: No biliary dilatation on CT. Lipase 173, no pancreatic inflammation on CT or abdominal tenderness.,  Right upper quadrant ultrasound suggest  cirrhosis.  Acute hepatitis panel is negative, statin on hold.  He is asymptomatic we will continue to monitor, could be due to peritonitis but will continue to monitor.   Acute hypoxic respiratory failure, multifocal pneumonia: Resolved.  CAD s/p CABG nov 2023 with demand myocardial ischemia. -  represents demand ischemia due to respiratory distress in ESRD patient. ECG uninterpretable with chronic LBBB.  Trend is stable and in non-ACS pattern, chest pain-free, already on DAPT, will commence statin once LFTs have stabilized.  Outpatient follow-up with cardiology postdischarge   ESRD:- Nephrology consulted and on board.  Case discussed with Dr. Glenna Fellows 02/16/2023.  Chronic Thrombocytopenia: Had low platelets in March as well. Given relatively recent CABG, will continue DAPT and monitor CBC.  Outpatient monitoring by PCP.   Morbid obesity: Body mass index is 35.51 kg/m.  Follow-up with PCP for weight loss.   IDT2DM: On SSI add Semglee.  Lab Results  Component Value Date   HGBA1C 5.9 (H) 05/30/2022   CBG (last 3)  Recent Labs    02/16/23 0041 02/16/23 0321 02/16/23 0741  GLUCAP 251* 321* 275*        Condition - Extremely Guarded  Family Communication  :  None  Code Status :  Full  Consults  :  Renal  PUD Prophylaxis : PPI   Procedures  :     CT head.  Nonacute.    Right upper quad ultrasound.  Possible cirrhosis.    CT abdomen pelvis. Multiple patchy airspace opacities are seen in visualized lung bases concerning for multifocal pneumonia. Bilateral renal atrophy is noted consistent with history of end-stage renal disease. Grossly stable high density material is noted within both nondilated intrarenal collecting systems as well as proximal ureters, concerning for either calcifications or residual contrast material as described on prior exam. Probable hepatic cirrhosis. Peritoneal dialysis catheter tip is noted in left lower quadrant. Mild amount of free fluid is noted in the  abdomen and pelvis, but most prominently seen in right upper quadrant. Stable mild prostatic enlargement. Aortic Atherosclerosis       Disposition Plan  :    Status is: Inpatient   DVT Prophylaxis  :    heparin injection 5,000 Units Start: 02/15/23 1745  Lab Results  Component Value Date   PLT 133 (L) 02/16/2023    Diet :  Diet Order             Diet NPO time specified Except for: Sips with Meds  Diet effective now                    Inpatient Medications  Scheduled Meds:  aspirin EC  81 mg Oral Daily   clopidogrel  75 mg Oral Daily   gentamicin cream  1 Application Topical Daily   heparin  5,000 Units Subcutaneous Q8H   insulin aspart  0-9 Units Subcutaneous Q4H   ipratropium-albuterol  3 mL Nebulization Q6H   methylPREDNISolone (SOLU-MEDROL) injection  40 mg Intravenous Q12H   midodrine  5 mg Oral TID WC   mometasone-formoterol  2 puff Inhalation BID   pantoprazole  40 mg Oral BID   sevelamer carbonate  2,400 mg Oral TID WC   sodium chloride flush  3 mL Intravenous Q12H   vancomycin variable dose per unstable renal function (pharmacist dosing)   Does not apply See admin instructions   Continuous Infusions:  ceFEPime (MAXIPIME) IV     dialysis solution 2.5% low-MG/low-CA     metronidazole Stopped (02/16/23 0254)   PRN Meds:.albuterol    Objective:   Vitals:   02/16/23 0042 02/16/23 0126 02/16/23 0325 02/16/23 0700  BP: (!) 136/103  111/66 112/81  Pulse:      Resp: 19  17 16   Temp: (!) 97.2 F (36.2 C)  (!) 96.4 F (35.8 C) 98.9 F (37.2 C)  TempSrc: Oral  Axillary Axillary  SpO2:  97%  97%  Weight: 101.1 kg     Height:        Wt Readings from Last 3 Encounters:  02/16/23 101.1 kg  01/09/23 100.4 kg  12/28/22 103.4 kg     Intake/Output Summary (Last 24 hours) at 02/16/2023 0907 Last data filed at 02/16/2023 0546 Gross per 24 hour  Intake 247.18 ml  Output --  Net 247.18 ml     Physical Exam  Awake Alert, No new F.N deficits,  Normal affect [place].AT,PERRAL Supple Neck, No JVD,   Symmetrical Chest wall movement, Good air movement bilaterally, CTAB RRR,No Gallops,Rubs or new Murmurs,  +ve B.Sounds, Abd Soft, No tenderness,   No Cyanosis, Clubbing or edema       Data Review:    Recent Labs  Lab 02/15/23 0006 02/16/23 0117  WBC 9.4 5.1  HGB 12.2* 10.6*  HCT 39.1 33.3*  PLT 139* 133*  MCV 87.7 86.7  MCH 27.4 27.6  MCHC 31.2 31.8  RDW 16.0* 16.1*  LYMPHSABS 0.2* 0.0*  MONOABS 0.0* 0.0*  EOSABS 0.0 0.0  BASOSABS 0.0 0.0    Recent Labs  Lab 02/15/23 0006 02/15/23 0827 02/16/23 0117  NA 132* 132* 130*  K 5.0 4.0 4.3  CL 89* 90* 91*  CO2 23 24 19*  ANIONGAP 20* 18* 20*  GLUCOSE 147* 126* 284*  BUN 43* 49* 57*  CREATININE 12.73* 13.32* 14.71*  AST 5,049* 4,152* 2,628*  ALT 2,456* 2,053* 1,431*  ALKPHOS 99 111 99  BILITOT 1.3* 1.0 0.8  ALBUMIN 2.1* 1.9* <1.5*  CRP  --  19.4* 18.8*  PROCALCITON  --  >150.00  --   INR  --  1.2 1.3*  AMMONIA  --  16  --   BNP 1,479.0*  --  826.8*  CALCIUM 8.1* 7.8* 7.2*      Recent Labs  Lab 02/15/23 0006 02/15/23 0827 02/16/23 0117  CRP  --  19.4* 18.8*  PROCALCITON  --  >150.00  --   INR  --  1.2 1.3*  AMMONIA  --  16  --   BNP 1,479.0*  --  826.8*  CALCIUM 8.1* 7.8* 7.2*     Micro Results Recent Results (from the past 240 hour(s))  Culture, blood (Routine X 2) w Reflex to ID Panel     Status: None (Preliminary result)   Collection Time: 02/15/23 12:09 PM   Specimen: BLOOD  Result Value Ref Range Status   Specimen Description BLOOD BLOOD RIGHT HAND  Final   Special Requests   Final    BOTTLES DRAWN AEROBIC AND ANAEROBIC Blood Culture adequate volume   Culture   Final    NO GROWTH < 24 HOURS Performed at [facility], [address], [place], [place] [address]    Report Status PENDING  Incomplete  Culture, blood (Routine X 2) w Reflex to ID Panel     Status: None (Preliminary result)   Collection Time: 02/15/23 12:09 PM   Specimen: BLOOD   Result Value Ref Range Status   Specimen Description BLOOD BLOOD RIGHT WRIST  Final   Special Requests   Final    BOTTLES DRAWN AEROBIC AND ANAEROBIC Blood Culture adequate volume   Culture   Final    NO GROWTH < 24 HOURS Performed at [facility], [address] [place], [place] [address]    Report Status PENDING  Incomplete  Body fluid culture w Gram Stain     Status: None (Preliminary result)   Collection Time: 02/15/23 11:30  PM   Specimen: Peritoneal Dialysate; Body Fluid  Result Value Ref Range Status   Specimen Description PERITONEAL DIALYSATE  Final   Special Requests NONE  Final   Gram Stain   Final    FEW WBC PRESENT,BOTH PMN AND MONONUCLEAR NO ORGANISMS SEEN Performed at [facility], [address] [place], [place] [address]    Culture PENDING  Incomplete   Report Status PENDING  Incomplete    Radiology Reports DG Chest Port 1 View  Result Date: 02/16/2023 CLINICAL DATA:  Shortness of breath. COPD and end-stage kidney disease. EXAM: PORTABLE CHEST 1 VIEW COMPARISON:  02/14/23 FINDINGS: Stable cardiac enlargement. Status post median sternotomy and CABG procedure. Blunting of the left costophrenic angle is concerning for small effusion. Bilateral mid and lower lung zone hazy opacities are identified compatible with multifocal pneumonia as noted on CT from prior day. Visualized osseous structures are unremarkable. IMPRESSION: 1. Bilateral mid and lower lung zone hazy opacities compatible with multifocal pneumonia as noted on CT from prior day. 2. Small left pleural effusion. Electronically Signed   By: Signa Kell M.D.   On: 02/16/2023 07:06   US Abdomen Limited RUQ (LIVER/GB)  Result Date: 02/15/2023 CLINICAL DATA:  Transaminitis. EXAM: ULTRASOUND ABDOMEN LIMITED RIGHT UPPER QUADRANT COMPARISON:  CT scan of same day. FINDINGS: Gallbladder: No gallstones or wall thickening visualized. No sonographic Murphy sign noted by sonographer. Possible 3 mm polyp. Common bile  duct: Diameter: 3 mm which is within normal limits. Liver: No focal lesion identified. Heterogeneous echotexture of hepatic parenchyma is noted with nodular contours consistent with hepatic cirrhosis. Portal vein is patent on color Doppler imaging with normal direction of blood flow towards the liver. Other: Mild ascites is noted around the liver. IMPRESSION: Hepatic cirrhosis with mild surrounding ascites. Possible 3 mm gallbladder polyp. Electronically Signed   By: Lupita Raider M.D.   On: 02/15/2023 10:31   CT ABDOMEN PELVIS WO CONTRAST  Result Date: 02/15/2023 CLINICAL DATA:  Epigastric abdominal pain. EXAM: CT ABDOMEN AND PELVIS WITHOUT CONTRAST TECHNIQUE: Multidetector CT imaging of the abdomen and pelvis was performed following the standard protocol without IV contrast. RADIATION DOSE REDUCTION: This exam was performed according to the departmental dose-optimization program which includes automated exposure control, adjustment of the mA and/or kV according to patient size and/or use of iterative reconstruction technique. COMPARISON:  September 27, 2022. FINDINGS: Lower chest: Multiple patchy opacities are noted in both lower lobes concerning for multifocal pneumonia. Hepatobiliary: No cholelithiasis or biliary dilatation is noted. Stable probable hepatic cysts. Slightly nodular hepatic contour is noted suggesting possible hepatic cirrhosis. Pancreas: Unremarkable. No pancreatic ductal dilatation or surrounding inflammatory changes. Spleen: Normal in size without focal abnormality. Adrenals/Urinary Tract: Adrenal glands appear normal. Bilateral renal atrophy is noted consistent with history of end-stage renal disease. Grossly stable high density material is noted within both intrarenal collecting systems as well as in the proximal ureters. It is uncertain if these represent calcifications or residual contrast material. Stable complex cystic lesion seen involving lower pole of left kidney for which no further  follow-up is required. Urinary bladder is decompressed. Stomach/Bowel: There is no evidence of bowel obstruction or inflammation. Stomach is unremarkable. The appendix is not clearly visualized. Vascular/Lymphatic: Aortic atherosclerosis. No enlarged abdominal or pelvic lymph nodes. Reproductive: Stable mild prostatic enlargement. Penile reservoir is noted in right lower quadrant. Other: Peritoneal dialysis catheter is noted with tip in left lower quadrant. Mild amount of free fluid is noted in the abdomen and pelvis,  but most prominently seen in right upper quadrant. Musculoskeletal: No acute or significant osseous findings. IMPRESSION: Multiple patchy airspace opacities are seen in visualized lung bases concerning for multifocal pneumonia. Bilateral renal atrophy is noted consistent with history of end-stage renal disease. Grossly stable high density material is noted within both nondilated intrarenal collecting systems as well as proximal ureters, concerning for either calcifications or residual contrast material as described on prior exam. Probable hepatic cirrhosis. Peritoneal dialysis catheter tip is noted in left lower quadrant. Mild amount of free fluid is noted in the abdomen and pelvis, but most prominently seen in right upper quadrant. Stable mild prostatic enlargement. Aortic Atherosclerosis (ICD10-I70.0). Electronically Signed   By: Lupita Raider M.D.   On: 02/15/2023 09:04   CT HEAD WO CONTRAST ( )  Result Date: 02/15/2023 CLINICAL DATA:  Mental status change with unknown cause.  COVID EXAM: CT HEAD WITHOUT CONTRAST TECHNIQUE: Contiguous axial images were obtained from the base of the skull through the vertex without intravenous contrast. RADIATION DOSE REDUCTION: This exam was performed according to the departmental dose-optimization program which includes automated exposure control, adjustment of the mA and/or kV according to patient size and/or use of iterative reconstruction technique.  COMPARISON:  None Available. FINDINGS: Brain: No evidence of acute infarction, hemorrhage, hydrocephalus, extra-axial collection or mass lesion/mass effect. Generalized atrophy. Mild chronic small vessel ischemia in the cerebral white matter. Vascular: No hyperdense vessel or unexpected calcification. Skull: Normal. Negative for fracture or focal lesion. Sinuses/Orbits: Remote blowout fracture at the right orbit. IMPRESSION: 1. No acute or reversible finding. 2. Moderate atrophy. Electronically Signed   By: Tiburcio Pea M.D.   On: 02/15/2023 07:14   DG Chest 2 View  Result Date: 02/14/2023 CLINICAL DATA:  Cough EXAM: CHEST - 2 VIEW COMPARISON:  Chest x-ray 09/16/2022 FINDINGS: Sternotomy wires are again seen. The heart size and mediastinal contours are within normal limits. Both lungs are clear. The visualized skeletal structures are unremarkable. IMPRESSION: No active cardiopulmonary disease. Electronically Signed   By: Darliss Cheney M.D.   On: 02/14/2023 23:30      Signature  -   Susa Raring M.D on 02/16/2023 at 9:07 AM   -  To page go to [email or web address]

## 2023-02-16 NOTE — Progress Notes (Signed)
   02/16/23 1230  Peritoneal Catheter Left lower abdomen  Placement Date/Time: 10/07/18 1610   Time out: Correct Patient;Correct Site;Correct Procedure;Special equipment/requirements available  Catheter Location: Left lower abdomen  Serial / Lot #: [id number]  Expiration Date: 11/14/22  Site Assessment Clean, Dry, Intact  Drainage Description None  Catheter status Deaccessed  Dressing Gauze/Drain sponge  Dressing Status Clean, Dry, Intact  Completion  Treatment Status Complete  Initial Drain Volume 1899  Average Dwell Time-Hour(s) 1  Average Dwell Time-Min(s) 30  Average Drain Time 77  Total Therapy Volume 9999  Total Therapy Time-Hour(s) 12  Total Therapy Time-Min(s) 12  Weight after Drain 222 lb 14.2 oz (101.1 kg)  Effluent Appearance Amber  Cell Count on Daytime Exchange N/A  Fluid Balance - CCPD  Total UF (+ value on cycler, pt loss) 156 mL  Procedure Comments  Tolerated treatment well? Yes  Peritoneal Dialysis Comments tx completed  Education / Care Plan  Dialysis Education Provided Yes  Hand-off documentation  Hand-off Given Given to shift RN/LPN   PD post treatment Note  PD treatment completed. Patient tolerated treatment well. PD effluent is clear. No specimen collected. PD exit site clean, dry and intact. Patient is awake, oriented and in no acute distress. Report given to bedside nurse.   Post treatment VS:  Total UF removed:  Post treatment weight:    [name] [name] Dialysis Nurse

## 2023-02-16 NOTE — Progress Notes (Signed)
PD tx initation note:   Pre TX VS: 124/101 (109)  Pre TX weight: 97.7 kg  PD treatment initiated via aseptic technique. Consent signed and in chart. Patient is alert and oriented. No complaints of pain. No specimen collected. PD exit site clean, dry and intact.  No Gentamycin and new dressing applied. Bedside RN educated on PD machine and how to contact tech support when PD machine alarms.PD tx initation note:

## 2023-02-17 ENCOUNTER — Inpatient Hospital Stay (HOSPITAL_COMMUNITY): Payer: Medicare Other

## 2023-02-17 DIAGNOSIS — G9340 Encephalopathy, unspecified: Secondary | ICD-10-CM | POA: Diagnosis not present

## 2023-02-17 HISTORY — PX: IR US GUIDE VASC ACCESS RIGHT: IMG2390

## 2023-02-17 HISTORY — PX: IR FLUORO GUIDE CV LINE RIGHT: IMG2283

## 2023-02-17 LAB — GLUCOSE, CAPILLARY
Glucose-Capillary: 116 mg/dL — ABNORMAL HIGH (ref 70–99)
Glucose-Capillary: 120 mg/dL — ABNORMAL HIGH (ref 70–99)
Glucose-Capillary: 122 mg/dL — ABNORMAL HIGH (ref 70–99)
Glucose-Capillary: 149 mg/dL — ABNORMAL HIGH (ref 70–99)
Glucose-Capillary: 186 mg/dL — ABNORMAL HIGH (ref 70–99)
Glucose-Capillary: 250 mg/dL — ABNORMAL HIGH (ref 70–99)

## 2023-02-17 LAB — HEPATITIS B SURFACE ANTIGEN: Hepatitis B Surface Ag: NONREACTIVE

## 2023-02-17 MED ORDER — LIDOCAINE-PRILOCAINE 2.5-2.5 % EX CREA: 1.0000 | TOPICAL_CREAM | CUTANEOUS | Status: DC | PRN

## 2023-02-17 MED ORDER — OXIDIZED CELLULOSE EX PADS
1.0000 | MEDICATED_PAD | Freq: Once | CUTANEOUS | Status: AC
  Administered 2023-02-17: 1 via TOPICAL
  Filled 2023-02-17: qty 1

## 2023-02-17 MED ORDER — MICROFIBRILLAR COLL HEMOSTAT EX POWD: 1.0000 g | Freq: Once | CUTANEOUS | Status: DC

## 2023-02-17 MED ORDER — ANTICOAGULANT SODIUM CITRATE 4% (200MG/5ML) IV SOLN: 5.0000 mL | Status: DC | PRN

## 2023-02-17 MED ORDER — PENTAFLUOROPROP-TETRAFLUOROETH EX AERO: 1.0000 | INHALATION_SPRAY | CUTANEOUS | Status: DC | PRN

## 2023-02-17 MED ORDER — HEPARIN SODIUM (PORCINE) 1000 UNIT/ML IJ SOLN
INTRAMUSCULAR | Status: AC
  Filled 2023-02-17: qty 10

## 2023-02-17 MED ORDER — LIDOCAINE HCL 1 % IJ SOLN
20.0000 mL | Freq: Once | INTRAMUSCULAR | Status: AC
  Administered 2023-02-17: 8 mL
  Filled 2023-02-17: qty 20

## 2023-02-17 MED ORDER — LIDOCAINE HCL (PF) 1 % IJ SOLN: 5.0000 mL | INTRAMUSCULAR | Status: DC | PRN

## 2023-02-17 MED ORDER — HEPARIN SODIUM (PORCINE) 1000 UNIT/ML DIALYSIS
1000.0000 [IU] | INTRAMUSCULAR | Status: DC | PRN
  Filled 2023-02-17: qty 1

## 2023-02-17 MED ORDER — ALTEPLASE 2 MG IJ SOLR: 2.0000 mg | Freq: Once | INTRAMUSCULAR | Status: DC | PRN

## 2023-02-17 MED ORDER — VANCOMYCIN HCL IN DEXTROSE 1-5 GM/200ML-% IV SOLN
1000.0000 mg | Freq: Once | INTRAVENOUS | Status: AC
  Administered 2023-02-17: 1000 mg via INTRAVENOUS
  Filled 2023-02-17: qty 200

## 2023-02-17 MED ORDER — LIDOCAINE HCL 1 % IJ SOLN
INTRAMUSCULAR | Status: AC
  Filled 2023-02-17: qty 20

## 2023-02-17 MED ORDER — CHLORHEXIDINE GLUCONATE CLOTH 2 % EX PADS
6.0000 | MEDICATED_PAD | Freq: Every day | CUTANEOUS | Status: DC
  Administered 2023-02-17 – 2023-02-22 (×8): 6 via TOPICAL

## 2023-02-17 MED ORDER — HEPARIN SODIUM (PORCINE) 1000 UNIT/ML IJ SOLN
10.0000 mL | Freq: Once | INTRAMUSCULAR | Status: AC
  Administered 2023-02-17: 2800 [IU] via INTRAVENOUS
  Filled 2023-02-17: qty 10

## 2023-02-17 MED ORDER — ACETAMINOPHEN 10 MG/ML IV SOLN
1000.0000 mg | Freq: Once | INTRAVENOUS | Status: AC | PRN
  Administered 2023-02-17: 1000 mg via INTRAVENOUS
  Filled 2023-02-17: qty 100

## 2023-02-17 NOTE — Procedures (Signed)
Interventional Radiology Procedure Note  Procedure: Temporary HD Catheter  Indication: Renal Failure  Findings:  Catheter tip is at the cavo-atrial junction. Catheter is ready for sure. Please refer to procedural dictation for full description.  Complications: None  EBL: < 10 mL  Acquanetta Belling, MD [phone]

## 2023-02-17 NOTE — Plan of Care (Signed)

## 2023-02-17 NOTE — Progress Notes (Signed)
PROGRESS NOTE                                                                                                                                                                                                             Patient Demographics:    [name] [name], is a 72 y.o. male, DOB - [birth year], BMW:[id number]  Outpatient Primary MD for the patient is Benetta Spar, MD    LOS - 2  Admit date - 02/14/2023    Chief Complaint  Patient presents with   Cough       Brief Narrative (HPI from H&P)   72 y.o. male with a history of CABG Nov 2023, COPD, IDT2DM, GERD, and ESRD on PD with hypotension on midodrine who was brought to the ED today by wife for confusion and progressive shortness of breath over the past several days. He had been diagnosed with covid-19 10 days PTA but was only treated with augmentin.  He presented to [facility] with lethargy, initially was thought to have pneumonia further workup suggests he has not SBP from his PD catheter.  Transferred to [name] for further care.   Subjective:    [name] [name] today has, No headache, No chest pain, No abdominal pain - No Nausea, No new weakness tingling or numbness, no SOB.   Assessment  & Plan :   Acute metabolic encephalopathy due to sepsis from peritonitis: Due to peritonitis in a patient with ESRD on peritoneal dialysis with catheter present on admission.  Currently responding very well to empiric IV antibiotics which will be continued, follow PD fluid culture results and blood cultures, sepsis pathophysiology has resolved, mentation close to baseline.  No headache, head CT unremarkable.  Continue to monitor.  Continue to hold Xanax and Neurontin.   ESRD:- Nephrology consulted and on board.  Case discussed with Dr. Glenna Fellows 02/16/2023 and again on 02/17/2023, looks like his PD catheter is now malfunctioning, IR consulted for temporary HD catheter placement on  02/17/2023, will require HD thereafter, nephrology aware.  Asymptomatic transaminitis with stable bilirubin and alk phos: No biliary dilatation on CT. Lipase 173, no pancreatic inflammation on CT or abdominal tenderness.,  Right upper quadrant ultrasound suggest cirrhosis.  Acute hepatitis panel is negative, statin on hold.  He is asymptomatic we will continue to monitor, could be due to peritonitis  but will continue to monitor.   Acute hypoxic respiratory failure, multifocal pneumonia: Resolved.  CAD s/p CABG nov 2023 with demand myocardial ischemia. -  represents demand ischemia due to respiratory distress in ESRD patient. ECG uninterpretable with chronic LBBB.  Trend is stable and in non-ACS pattern, chest pain-free, already on DAPT, will commence statin once LFTs have stabilized.  Outpatient follow-up with cardiology postdischarge   Chronic Thrombocytopenia: Had low platelets in March as well. Given relatively recent CABG, will continue DAPT and monitor CBC.  Outpatient monitoring by PCP.   Morbid obesity: Body mass index is 35.51 kg/m.  Follow-up with PCP for weight loss.   IDT2DM: On SSI add Semglee.  Lab Results  Component Value Date   HGBA1C 5.9 (H) 05/30/2022   CBG (last 3)  Recent Labs    02/17/23 0033 02/17/23 0401 02/17/23 0832  GLUCAP 250* 186* 122*        Condition - Extremely Guarded  Family Communication  :  None  Code Status :  Full  Consults  :  Renal  PUD Prophylaxis : PPI   Procedures  :     CT head.  Nonacute.    Right upper quad ultrasound.  Possible cirrhosis.    CT abdomen pelvis. Multiple patchy airspace opacities are seen in visualized lung bases concerning for multifocal pneumonia. Bilateral renal atrophy is noted consistent with history of end-stage renal disease. Grossly stable high density material is noted within both nondilated intrarenal collecting systems as well as proximal ureters, concerning for either calcifications or residual  contrast material as described on prior exam. Probable hepatic cirrhosis. Peritoneal dialysis catheter tip is noted in left lower quadrant. Mild amount of free fluid is noted in the abdomen and pelvis, but most prominently seen in right upper quadrant. Stable mild prostatic enlargement. Aortic Atherosclerosis       Disposition Plan  :    Status is: Inpatient   DVT Prophylaxis  :    heparin injection 5,000 Units Start: 02/15/23 1745  Lab Results  Component Value Date   PLT 139 (L) 02/17/2023    Diet :  Diet Order             Diet renal/carb modified with fluid restriction Diet-HS Snack? Nothing; Fluid restriction: 1200 mL Fluid; Room service appropriate? Yes; Fluid consistency: Thin  Diet effective now                    Inpatient Medications  Scheduled Meds:  aspirin EC  81 mg Oral Daily   Chlorhexidine Gluconate Cloth  6 each Topical Q0600   clopidogrel  75 mg Oral Daily   gentamicin cream  1 Application Topical Daily   heparin  5,000 Units Subcutaneous Q8H   insulin aspart  0-9 Units Subcutaneous Q4H   insulin glargine-yfgn  15 Units Subcutaneous Daily   ipratropium-albuterol  3 mL Nebulization Q6H   midodrine  5 mg Oral TID WC   mometasone-formoterol  2 puff Inhalation BID   pantoprazole  40 mg Oral BID   sevelamer carbonate  2,400 mg Oral TID WC   sodium chloride flush  3 mL Intravenous Q12H   vancomycin variable dose per unstable renal function (pharmacist dosing)   Does not apply See admin instructions   Continuous Infusions:  ceFEPime (MAXIPIME) IV Stopped (02/16/23 1440)   dialysis solution 2.5% low-MG/low-CA     PRN Meds:.albuterol, heparin sodium (porcine) 3,000 Units in dialysis solution 2.5% low-MG/low-CA 6,000 mL dialysis solution  Objective:   Vitals:   02/17/23 0000 02/17/23 0337 02/17/23 0400 02/17/23 0500  BP:  97/78 103/79   Pulse:   62   Resp: (!) 24 20 20    Temp:  98.1 F (36.7 C)    TempSrc:  Oral    SpO2:  96% 91%   Weight:     99.6 kg  Height:        Wt Readings from Last 3 Encounters:  02/17/23 99.6 kg  01/09/23 100.4 kg  12/28/22 103.4 kg     Intake/Output Summary (Last 24 hours) at 02/17/2023 0940 Last data filed at 02/17/2023 0815 Gross per 24 hour  Intake -1324 ml  Output 156 ml  Net -1480 ml     Physical Exam  Awake Alert, No new F.N deficits, Normal affect .AT,PERRAL Supple Neck, No JVD,   Symmetrical Chest wall movement, Good air movement bilaterally, bilateral wheezing RRR,No Gallops,Rubs or new Murmurs,  +ve B.Sounds, Abd Soft, Mild tenderness,   No Cyanosis, Clubbing or edema       Data Review:    Recent Labs  Lab 02/15/23 0006 02/16/23 0117 02/17/23 0351  WBC 9.4 5.1 6.7  HGB 12.2* 10.6* 10.4*  HCT 39.1 33.3* 32.3*  PLT 139* 133* 139*  MCV 87.7 86.7 83.9  MCH 27.4 27.6 27.0  MCHC 31.2 31.8 32.2  RDW 16.0* 16.1* 16.1*  LYMPHSABS 0.2* 0.0* 0.2*  MONOABS 0.0* 0.0* 0.2  EOSABS 0.0 0.0 0.0  BASOSABS 0.0 0.0 0.0    Recent Labs  Lab 02/15/23 0006 02/15/23 0827 02/16/23 0116 02/16/23 0117 02/17/23 0351  NA 132* 132*  --  130* 131*  K 5.0 4.0  --  4.3 4.3  CL 89* 90*  --  91* 90*  CO2 23 24  --  19* 23  ANIONGAP 20* 18*  --  20* 18*  GLUCOSE 147* 126*  --  284* 210*  BUN 43* 49*  --  57* 69*  CREATININE 12.73* 13.32*  --  14.71* 15.03*  AST 5,049* 4,152*  --  2,628* 1,252*  ALT 2,456* 2,053*  --  1,431* 1,018*  ALKPHOS 99 111  --  99 127*  BILITOT 1.3* 1.0  --  0.8 0.7  ALBUMIN 2.1* 1.9*  --  <1.5* 1.6*  CRP  --  19.4*  --  18.8* 10.6*  PROCALCITON  --  >150.00 >150.00  --  >150.00  INR  --  1.2  --  1.3*  --   AMMONIA  --  16  --   --   --   BNP 1,479.0*  --   --  826.8* 1,278.7*  MG  --   --   --   --  1.8  CALCIUM 8.1* 7.8*  --  7.2* 7.3*      Recent Labs  Lab 02/15/23 0006 02/15/23 0827 02/16/23 0116 02/16/23 0117 02/17/23 0351  CRP  --  19.4*  --  18.8* 10.6*  PROCALCITON  --  >150.00 >150.00  --  >150.00  INR  --  1.2  --  1.3*  --    AMMONIA  --  16  --   --   --   BNP 1,479.0*  --   --  826.8* 1,278.7*  MG  --   --   --   --  1.8  CALCIUM 8.1* 7.8*  --  7.2* 7.3*     Micro Results Recent Results (from the past 240 hour(s))  Culture, blood (Routine X 2) w Reflex  to ID Panel     Status: None (Preliminary result)   Collection Time: 02/15/23 12:09 PM   Specimen: BLOOD  Result Value Ref Range Status   Specimen Description BLOOD BLOOD RIGHT HAND  Final   Special Requests   Final    BOTTLES DRAWN AEROBIC AND ANAEROBIC Blood Culture adequate volume   Culture   Final    NO GROWTH 2 DAYS Performed at [facility], [address], [place], [place] [address]    Report Status PENDING  Incomplete  Culture, blood (Routine X 2) w Reflex to ID Panel     Status: None (Preliminary result)   Collection Time: 02/15/23 12:09 PM   Specimen: BLOOD  Result Value Ref Range Status   Specimen Description BLOOD BLOOD RIGHT WRIST  Final   Special Requests   Final    BOTTLES DRAWN AEROBIC AND ANAEROBIC Blood Culture adequate volume   Culture   Final    NO GROWTH 2 DAYS Performed at [facility], [address], [place], [place] [address]    Report Status PENDING  Incomplete  Body fluid culture w Gram Stain     Status: None (Preliminary result)   Collection Time: 02/15/23 11:30 PM   Specimen: Peritoneal Dialysate; Body Fluid  Result Value Ref Range Status   Specimen Description PERITONEAL DIALYSATE  Final   Special Requests NONE  Final   Gram Stain   Final    FEW WBC PRESENT,BOTH PMN AND MONONUCLEAR NO ORGANISMS SEEN    Culture   Final    NO GROWTH 1 DAY Performed at [facility], [address], [place], [place] [address]    Report Status PENDING  Incomplete    Radiology Reports DG Chest Port 1 View  Result Date: 02/16/2023 CLINICAL DATA:  Shortness of breath. COPD and end-stage kidney disease. EXAM: PORTABLE CHEST 1 VIEW COMPARISON:  02/14/23 FINDINGS: Stable cardiac enlargement. Status post median sternotomy and CABG  procedure. Blunting of the left costophrenic angle is concerning for small effusion. Bilateral mid and lower lung zone hazy opacities are identified compatible with multifocal pneumonia as noted on CT from prior day. Visualized osseous structures are unremarkable. IMPRESSION: 1. Bilateral mid and lower lung zone hazy opacities compatible with multifocal pneumonia as noted on CT from prior day. 2. Small left pleural effusion. Electronically Signed   By: Signa Kell M.D.   On: 02/16/2023 07:06   US Abdomen Limited RUQ (LIVER/GB)  Result Date: 02/15/2023 CLINICAL DATA:  Transaminitis. EXAM: ULTRASOUND ABDOMEN LIMITED RIGHT UPPER QUADRANT COMPARISON:  CT scan of same day. FINDINGS: Gallbladder: No gallstones or wall thickening visualized. No sonographic Murphy sign noted by sonographer. Possible 3 mm polyp. Common bile duct: Diameter: 3 mm which is within normal limits. Liver: No focal lesion identified. Heterogeneous echotexture of hepatic parenchyma is noted with nodular contours consistent with hepatic cirrhosis. Portal vein is patent on color Doppler imaging with normal direction of blood flow towards the liver. Other: Mild ascites is noted around the liver. IMPRESSION: Hepatic cirrhosis with mild surrounding ascites. Possible 3 mm gallbladder polyp. Electronically Signed   By: Lupita Raider M.D.   On: 02/15/2023 10:31   CT ABDOMEN PELVIS WO CONTRAST  Result Date: 02/15/2023 CLINICAL DATA:  Epigastric abdominal pain. EXAM: CT ABDOMEN AND PELVIS WITHOUT CONTRAST TECHNIQUE: Multidetector CT imaging of the abdomen and pelvis was performed following the standard protocol without IV contrast. RADIATION DOSE REDUCTION: This exam was performed according to the departmental dose-optimization program which includes automated  exposure control, adjustment of the mA and/or kV according to patient size and/or use of iterative reconstruction technique. COMPARISON:  September 27, 2022. FINDINGS: Lower chest: Multiple patchy  opacities are noted in both lower lobes concerning for multifocal pneumonia. Hepatobiliary: No cholelithiasis or biliary dilatation is noted. Stable probable hepatic cysts. Slightly nodular hepatic contour is noted suggesting possible hepatic cirrhosis. Pancreas: Unremarkable. No pancreatic ductal dilatation or surrounding inflammatory changes. Spleen: Normal in size without focal abnormality. Adrenals/Urinary Tract: Adrenal glands appear normal. Bilateral renal atrophy is noted consistent with history of end-stage renal disease. Grossly stable high density material is noted within both intrarenal collecting systems as well as in the proximal ureters. It is uncertain if these represent calcifications or residual contrast material. Stable complex cystic lesion seen involving lower pole of left kidney for which no further follow-up is required. Urinary bladder is decompressed. Stomach/Bowel: There is no evidence of bowel obstruction or inflammation. Stomach is unremarkable. The appendix is not clearly visualized. Vascular/Lymphatic: Aortic atherosclerosis. No enlarged abdominal or pelvic lymph nodes. Reproductive: Stable mild prostatic enlargement. Penile reservoir is noted in right lower quadrant. Other: Peritoneal dialysis catheter is noted with tip in left lower quadrant. Mild amount of free fluid is noted in the abdomen and pelvis, but most prominently seen in right upper quadrant. Musculoskeletal: No acute or significant osseous findings. IMPRESSION: Multiple patchy airspace opacities are seen in visualized lung bases concerning for multifocal pneumonia. Bilateral renal atrophy is noted consistent with history of end-stage renal disease. Grossly stable high density material is noted within both nondilated intrarenal collecting systems as well as proximal ureters, concerning for either calcifications or residual contrast material as described on prior exam. Probable hepatic cirrhosis. Peritoneal dialysis catheter  tip is noted in left lower quadrant. Mild amount of free fluid is noted in the abdomen and pelvis, but most prominently seen in right upper quadrant. Stable mild prostatic enlargement. Aortic Atherosclerosis (ICD10-I70.0). Electronically Signed   By: Lupita Raider M.D.   On: 02/15/2023 09:04   CT HEAD WO CONTRAST ( )  Result Date: 02/15/2023 CLINICAL DATA:  Mental status change with unknown cause.  COVID EXAM: CT HEAD WITHOUT CONTRAST TECHNIQUE: Contiguous axial images were obtained from the base of the skull through the vertex without intravenous contrast. RADIATION DOSE REDUCTION: This exam was performed according to the departmental dose-optimization program which includes automated exposure control, adjustment of the mA and/or kV according to patient size and/or use of iterative reconstruction technique. COMPARISON:  None Available. FINDINGS: Brain: No evidence of acute infarction, hemorrhage, hydrocephalus, extra-axial collection or mass lesion/mass effect. Generalized atrophy. Mild chronic small vessel ischemia in the cerebral white matter. Vascular: No hyperdense vessel or unexpected calcification. Skull: Normal. Negative for fracture or focal lesion. Sinuses/Orbits: Remote blowout fracture at the right orbit. IMPRESSION: 1. No acute or reversible finding. 2. Moderate atrophy. Electronically Signed   By: Tiburcio Pea M.D.   On: 02/15/2023 07:14   DG Chest 2 View  Result Date: 02/14/2023 CLINICAL DATA:  Cough EXAM: CHEST - 2 VIEW COMPARISON:  Chest x-ray 09/16/2022 FINDINGS: Sternotomy wires are again seen. The heart size and mediastinal contours are within normal limits. Both lungs are clear. The visualized skeletal structures are unremarkable. IMPRESSION: No active cardiopulmonary disease. Electronically Signed   By: Darliss Cheney M.D.   On: 02/14/2023 23:30      Signature  -   Susa Raring M.D on 02/17/2023 at 9:40 AM   -  To page go to [email or web address]

## 2023-02-17 NOTE — Progress Notes (Addendum)
Subjective: PD catheter malfunction overnight --> initially drained but did not drain rest of PM. C/o diffuse abd pain.  Afebrile.  Worsening dyspnea.   Objective Vital signs in last 24 hours: Vitals:   02/17/23 0000 02/17/23 0337 02/17/23 0400 02/17/23 0500  BP:  97/78 103/79   Pulse:   62   Resp: (!) 24 (!) 24 20   Temp:  98.1 F (36.7 C)    TempSrc:  Oral    SpO2:  96% 91%   Weight:    99.6 kg  Height:       Weight change: -3.4 kg  Physical Exam: General: Adult male in bed looking mildly uncomfortable Heart: RRR no MRG Lungs: wet sounding cough, diffuse rhonchi, dec BS bases Abdomen: NABS, soft, mildly TTP but no rebound or guarding, ND, PD catheter site dressing present dry Extremities: No pedal edema Dialysis Access: PD cath present LUA AVF positive thrill   Outpatient PD orders:  Followed by [facility] (note his PD RN was on vacation  but nephrology called [place] location Cheree Ditto) for orders earlier today)   CCPD 5 cycles, 3000 fill vol (note pt states on consult he thinks is 2.5 liters?) Total time: 10 hrs Last fill 1500 cc Meds: Mircera 150 mcg q monthly Venofer 100mg  once a month  Problem/Plan:   Peritonitis = cell count 2200, 93% PMN, continue current antibiotics = vancomycin, cefepime until culture back  - currently have not resulted ESRD - PD not working overnight, consult IR for temp HD catheter given worsening volume.  Obtain KUB assess position of PD catheter. Metabolic encephalopathy due to sepsis from peritonitis/multifocal pneumonia /recent COVID not on restrictions currently-plan per admit team-mental status vastly improving on IV antibiotics-  Acute hide toxic respiratory failure multifocal pneumonia - concern for worsening volume status playing role now too, UF with HD today HTN/volume - UF with HD today.   Transaminitis -workup per admitting,persistent, setting of covid, chronic hypotension- note that abdominal imaging is  suggestive of possible cirrhosis, as well.  Improving Anemia -of CKD-Hgb 10.6 <12.2 no ESA needed currently monitor Secondary hyperparathyroidism -corrected calcium 10, no vitamin D, phosphorus 8.2, Renvela binder started  Updated wife on phone re: plan around 11:30am today.   Estill Bakes MD [facility] Pager [phone]   Labs: Basic Metabolic Panel: Recent Labs  Lab 02/15/23 0827 02/16/23 0117 02/17/23 0351  NA 132* 130* 131*  K 4.0 4.3 4.3  CL 90* 91* 90*  CO2 24 19* 23  GLUCOSE 126* 284* 210*  BUN 49* 57* 69*  CREATININE 13.32* 14.71* 15.03*  CALCIUM 7.8* 7.2* 7.3*  PHOS  --  8.2*  --    Liver Function Tests: Recent Labs  Lab 02/15/23 0827 02/16/23 0117 02/17/23 0351  AST 4,152* 2,628* 1,252*  ALT 2,053* 1,431* 1,018*  ALKPHOS 111 99 127*  BILITOT 1.0 0.8 0.7  PROT 5.9* 5.3* 5.2*  ALBUMIN 1.9* <1.5* 1.6*   Recent Labs  Lab 02/15/23 0006  LIPASE 173*   Recent Labs  Lab 02/15/23 0827  AMMONIA 16   CBC: Recent Labs  Lab 02/15/23 0006 02/16/23 0117 02/17/23 0351  WBC 9.4 5.1 6.7  NEUTROABS 9.2* 5.1 5.7  HGB 12.2* 10.6* 10.4*  HCT 39.1 33.3* 32.3*  MCV 87.7 86.7 83.9  PLT 139* 133* 139*   Cardiac Enzymes: Recent Labs  Lab 02/15/23 1110  CKTOTAL 335   CBG: Recent Labs  Lab 02/16/23 1149 02/16/23 1626 02/16/23 2009 02/17/23 0033 02/17/23 0401  GLUCAP  337* 213* 200* 250* 186*    Studies/Results: DG Chest Port 1 View  Result Date: 02/16/2023 CLINICAL DATA:  Shortness of breath. COPD and end-stage kidney disease. EXAM: PORTABLE CHEST 1 VIEW COMPARISON:  02/14/23 FINDINGS: Stable cardiac enlargement. Status post median sternotomy and CABG procedure. Blunting of the left costophrenic angle is concerning for small effusion. Bilateral mid and lower lung zone hazy opacities are identified compatible with multifocal pneumonia as noted on CT from prior day. Visualized osseous structures are unremarkable. IMPRESSION: 1. Bilateral mid  and lower lung zone hazy opacities compatible with multifocal pneumonia as noted on CT from prior day. 2. Small left pleural effusion. Electronically Signed   By: Signa Kell M.D.   On: 02/16/2023 07:06   US Abdomen Limited RUQ (LIVER/GB)  Result Date: 02/15/2023 CLINICAL DATA:  Transaminitis. EXAM: ULTRASOUND ABDOMEN LIMITED RIGHT UPPER QUADRANT COMPARISON:  CT scan of same day. FINDINGS: Gallbladder: No gallstones or wall thickening visualized. No sonographic Murphy sign noted by sonographer. Possible 3 mm polyp. Common bile duct: Diameter: 3 mm which is within normal limits. Liver: No focal lesion identified. Heterogeneous echotexture of hepatic parenchyma is noted with nodular contours consistent with hepatic cirrhosis. Portal vein is patent on color Doppler imaging with normal direction of blood flow towards the liver. Other: Mild ascites is noted around the liver. IMPRESSION: Hepatic cirrhosis with mild surrounding ascites. Possible 3 mm gallbladder polyp. Electronically Signed   By: Lupita Raider M.D.   On: 02/15/2023 10:31   CT ABDOMEN PELVIS WO CONTRAST  Result Date: 02/15/2023 CLINICAL DATA:  Epigastric abdominal pain. EXAM: CT ABDOMEN AND PELVIS WITHOUT CONTRAST TECHNIQUE: Multidetector CT imaging of the abdomen and pelvis was performed following the standard protocol without IV contrast. RADIATION DOSE REDUCTION: This exam was performed according to the departmental dose-optimization program which includes automated exposure control, adjustment of the mA and/or kV according to patient size and/or use of iterative reconstruction technique. COMPARISON:  September 27, 2022. FINDINGS: Lower chest: Multiple patchy opacities are noted in both lower lobes concerning for multifocal pneumonia. Hepatobiliary: No cholelithiasis or biliary dilatation is noted. Stable probable hepatic cysts. Slightly nodular hepatic contour is noted suggesting possible hepatic cirrhosis. Pancreas: Unremarkable. No pancreatic  ductal dilatation or surrounding inflammatory changes. Spleen: Normal in size without focal abnormality. Adrenals/Urinary Tract: Adrenal glands appear normal. Bilateral renal atrophy is noted consistent with history of end-stage renal disease. Grossly stable high density material is noted within both intrarenal collecting systems as well as in the proximal ureters. It is uncertain if these represent calcifications or residual contrast material. Stable complex cystic lesion seen involving lower pole of left kidney for which no further follow-up is required. Urinary bladder is decompressed. Stomach/Bowel: There is no evidence of bowel obstruction or inflammation. Stomach is unremarkable. The appendix is not clearly visualized. Vascular/Lymphatic: Aortic atherosclerosis. No enlarged abdominal or pelvic lymph nodes. Reproductive: Stable mild prostatic enlargement. Penile reservoir is noted in right lower quadrant. Other: Peritoneal dialysis catheter is noted with tip in left lower quadrant. Mild amount of free fluid is noted in the abdomen and pelvis, but most prominently seen in right upper quadrant. Musculoskeletal: No acute or significant osseous findings. IMPRESSION: Multiple patchy airspace opacities are seen in visualized lung bases concerning for multifocal pneumonia. Bilateral renal atrophy is noted consistent with history of end-stage renal disease. Grossly stable high density material is noted within both nondilated intrarenal collecting systems as well as proximal ureters, concerning for either calcifications or residual contrast material as described on  prior exam. Probable hepatic cirrhosis. Peritoneal dialysis catheter tip is noted in left lower quadrant. Mild amount of free fluid is noted in the abdomen and pelvis, but most prominently seen in right upper quadrant. Stable mild prostatic enlargement. Aortic Atherosclerosis (ICD10-I70.0). Electronically Signed   By: Lupita Raider M.D.   On: 02/15/2023  09:04   Medications:  ceFEPime (MAXIPIME) IV Stopped (02/16/23 1440)   dialysis solution 2.5% low-MG/low-CA      aspirin EC  81 mg Oral Daily   clopidogrel  75 mg Oral Daily   gentamicin cream  1 Application Topical Daily   heparin  5,000 Units Subcutaneous Q8H   insulin aspart  0-9 Units Subcutaneous Q4H   insulin glargine-yfgn  15 Units Subcutaneous Daily   ipratropium-albuterol  3 mL Nebulization Q6H   midodrine  5 mg Oral TID WC   mometasone-formoterol  2 puff Inhalation BID   pantoprazole  40 mg Oral BID   sevelamer carbonate  2,400 mg Oral TID WC   sodium chloride flush  3 mL Intravenous Q12H   vancomycin variable dose per unstable renal function (pharmacist dosing)   Does not apply See admin instructions

## 2023-02-17 NOTE — Progress Notes (Signed)
Pharmacy Antibiotic Note - Initial [name] [name] is a 72 y.o. male admitted on 02/14/2023 with  sepsis . (Possible peritonitis). Pharmacy has been consulted for cefepime and vancomycin dosing. Transferred from [facility] ED to [facility]. ESRD on PD prior to admission, to resume inpatient.  -WBC= 6.7, afebrile -peritoneal fluid: cell count 2200, neutrophils 93 -He is undergoing HD today   Plan: -Continue cefepime 1gm IV q24h -Vancomycin 1gm IV post HD today -Will follow plans for HD   Height: 5\' 6"  (167.6 cm) Weight:  (bed not working for weight) IBW/kg (Calculated) : 63.8  Temp (24hrs), Avg:98.8 F (37.1 C), Min:97.8 F (36.6 C), Max:101 F (38.3 C)  Recent Labs  Lab 02/15/23 0006 02/15/23 0827 02/16/23 0117 02/17/23 0351  WBC 9.4  --  5.1 6.7  CREATININE 12.73* 13.32* 14.71* 15.03*    Estimated Creatinine Clearance: 4.9 mL/min (A) (by C-G formula based on SCr of 15.03 mg/dL (H)).    Allergies  Allergen Reactions   Opana [Oxymorphone Hcl] Itching   Tramadol Itching    Antimicrobials this admission: Cefepime 8/1 >> Vancomycin x1 8/1 >> Flagyl 8/1 >> Azithro 8/1 x 1 dose CTX 8/1  x 1 dose  Dose adjustments this admission:   Microbiology results: 8/1 Bcx at AP: ngtd Peritoneal cx: ngtd MRSA PCR: neg   Thank you for allowing pharmacy to be a part of this patient's care.  Harland German, PharmD Clinical Pharmacist **Pharmacist phone directory can now be found on [email or web address] ([email or web address]).  Listed under [facility].

## 2023-02-17 NOTE — Progress Notes (Signed)
Patient PD cycler alarm alarming and pausing "Patient Slow Flow" during draining of cycle 1.  Copywriter, advertising to try and trouble shoot without success, called PD nurse and PD nurse said they were having trouble with the flow being slow the previous night and to keep pushing resume.

## 2023-02-17 NOTE — Progress Notes (Signed)
PD post treatment note  PD treatment  not completed as expected due to not enough drain volume removed during  the first drain. Pt is complaining of SOB and abdominal pain. MD made aware.  PD exit site clean, dry and intact. Patient is awake, oriented and in no acute distress.  Report given to bedside nurse.       02/17/23 0815  Peritoneal Catheter Left lower abdomen  Placement Date/Time: 10/07/18 9528   Time out: Correct Patient;Correct Site;Correct Procedure;Special equipment/requirements available  Catheter Location: Left lower abdomen  Serial / Lot #: [id number]  Expiration Date: 11/14/22  Site Assessment Clean, Dry, Intact  Drainage Description None  Catheter status Deaccessed  Dressing Gauze/Drain sponge  Dressing Status Clean, Dry, Intact  Dressing Intervention Dressing reinforced  Completion  Treatment Status Complete (not completed.)  Initial Drain Volume 3  Average Dwell Time-Hour(s) 1  Average Dwell Time-Min(s) 30  Average Drain Time 584  Total Therapy Volume 2500  Total Therapy Time-Hour(s) 11  Total Therapy Time-Min(s) 28  Weight after Drain 222 lb 10.6 oz (101 kg)  Effluent Appearance Amber  Cell Count on Daytime Exchange N/A  Fluid Balance - CCPD  Total UF (- value on cycler, pt gain) -1524 mL  Education / Care Plan  Dialysis Education Provided Yes  Hand-off documentation  Hand-off Given Given to shift RN/LPN  Report given to (Full Name) Arline Asp  Hand-off Received Received from shift RN/LPN  Report received from (Full Name) , , Rn

## 2023-02-18 ENCOUNTER — Inpatient Hospital Stay (HOSPITAL_COMMUNITY): Payer: Medicare Other

## 2023-02-18 ENCOUNTER — Encounter (HOSPITAL_COMMUNITY): Payer: Medicare Other

## 2023-02-18 DIAGNOSIS — R609 Edema, unspecified: Secondary | ICD-10-CM | POA: Diagnosis not present

## 2023-02-18 DIAGNOSIS — G9340 Encephalopathy, unspecified: Secondary | ICD-10-CM

## 2023-02-18 DIAGNOSIS — J9601 Acute respiratory failure with hypoxia: Secondary | ICD-10-CM | POA: Diagnosis not present

## 2023-02-18 LAB — CBC WITH DIFFERENTIAL/PLATELET
Abs Immature Granulocytes: 0.1 10*3/uL — ABNORMAL HIGH (ref 0.00–0.07)
Basophils Absolute: 0 10*3/uL (ref 0.0–0.1)
Basophils Relative: 0 %
Blasts: 2 %
Eosinophils Absolute: 0 10*3/uL (ref 0.0–0.5)
Eosinophils Relative: 0 %
HCT: 29.8 % — ABNORMAL LOW (ref 39.0–52.0)
Hemoglobin: 9.8 g/dL — ABNORMAL LOW (ref 13.0–17.0)
Lymphocytes Relative: 2 %
Lymphs Abs: 0.1 10*3/uL — ABNORMAL LOW (ref 0.7–4.0)
MCH: 27.6 pg (ref 26.0–34.0)
MCHC: 32.9 g/dL (ref 30.0–36.0)
MCV: 83.9 fL (ref 80.0–100.0)
Monocytes Absolute: 0.2 10*3/uL (ref 0.1–1.0)
Monocytes Relative: 3 %
Myelocytes: 2 %
Neutro Abs: 4.6 10*3/uL (ref 1.7–7.7)
Neutrophils Relative %: 91 %
Platelets: 116 10*3/uL — ABNORMAL LOW (ref 150–400)
RBC: 3.55 MIL/uL — ABNORMAL LOW (ref 4.22–5.81)
RDW: 15.8 % — ABNORMAL HIGH (ref 11.5–15.5)
WBC: 5.1 10*3/uL (ref 4.0–10.5)
nRBC: 14 /100{WBCs} — ABNORMAL HIGH
nRBC: 4.4 % — ABNORMAL HIGH (ref 0.0–0.2)

## 2023-02-18 LAB — GLUCOSE, CAPILLARY
Glucose-Capillary: 109 mg/dL — ABNORMAL HIGH (ref 70–99)
Glucose-Capillary: 130 mg/dL — ABNORMAL HIGH (ref 70–99)
Glucose-Capillary: 132 mg/dL — ABNORMAL HIGH (ref 70–99)
Glucose-Capillary: 43 mg/dL — CL (ref 70–99)
Glucose-Capillary: 71 mg/dL (ref 70–99)
Glucose-Capillary: 74 mg/dL (ref 70–99)
Glucose-Capillary: 80 mg/dL (ref 70–99)
Glucose-Capillary: 89 mg/dL (ref 70–99)
Glucose-Capillary: 96 mg/dL (ref 70–99)

## 2023-02-18 LAB — COMPREHENSIVE METABOLIC PANEL WITH GFR
ALT: 594 U/L — ABNORMAL HIGH (ref 0–44)
ALT: 686 U/L — ABNORMAL HIGH (ref 0–44)
AST: 520 U/L — ABNORMAL HIGH (ref 15–41)
AST: 635 U/L — ABNORMAL HIGH (ref 15–41)
Albumin: 1.6 g/dL — ABNORMAL LOW (ref 3.5–5.0)
Albumin: <1.5 g/dL — ABNORMAL LOW (ref 3.5–5.0)
Alkaline Phosphatase: 142 U/L — ABNORMAL HIGH (ref 38–126)
Alkaline Phosphatase: 152 U/L — ABNORMAL HIGH (ref 38–126)
Anion gap: 13 (ref 5–15)
Anion gap: 16 — ABNORMAL HIGH (ref 5–15)
BUN: 35 mg/dL — ABNORMAL HIGH (ref 8–23)
BUN: 38 mg/dL — ABNORMAL HIGH (ref 8–23)
CO2: 25 mmol/L (ref 22–32)
CO2: 26 mmol/L (ref 22–32)
Calcium: 7.2 mg/dL — ABNORMAL LOW (ref 8.9–10.3)
Calcium: 7.3 mg/dL — ABNORMAL LOW (ref 8.9–10.3)
Chloride: 92 mmol/L — ABNORMAL LOW (ref 98–111)
Chloride: 93 mmol/L — ABNORMAL LOW (ref 98–111)
Creatinine, Ser: 8.36 mg/dL — ABNORMAL HIGH (ref 0.61–1.24)
Creatinine, Ser: 9.61 mg/dL — ABNORMAL HIGH (ref 0.61–1.24)
GFR, Estimated: 5 mL/min — ABNORMAL LOW (ref >60)
GFR, Estimated: 6 mL/min — ABNORMAL LOW (ref >60)
Glucose, Bld: 154 mg/dL — ABNORMAL HIGH (ref 70–99)
Glucose, Bld: 92 mg/dL (ref 70–99)
Potassium: 3.1 mmol/L — ABNORMAL LOW (ref 3.5–5.1)
Potassium: 3.3 mmol/L — ABNORMAL LOW (ref 3.5–5.1)
Sodium: 132 mmol/L — ABNORMAL LOW (ref 135–145)
Sodium: 133 mmol/L — ABNORMAL LOW (ref 135–145)
Total Bilirubin: 1 mg/dL (ref 0.3–1.2)
Total Bilirubin: 1.3 mg/dL — ABNORMAL HIGH (ref 0.3–1.2)
Total Protein: 5.1 g/dL — ABNORMAL LOW (ref 6.5–8.1)
Total Protein: 5.3 g/dL — ABNORMAL LOW (ref 6.5–8.1)

## 2023-02-18 LAB — RESPIRATORY PANEL BY PCR

## 2023-02-18 LAB — DIC (DISSEMINATED INTRAVASCULAR COAGULATION)PANEL
D-Dimer, Quant: 10.99 ug/mL-FEU — ABNORMAL HIGH (ref 0.00–0.50)
Fibrinogen: 492 mg/dL — ABNORMAL HIGH (ref 210–475)
INR: 1.2 (ref 0.8–1.2)
Platelets: 126 10*3/uL — ABNORMAL LOW (ref 150–400)
Prothrombin Time: 15.5 seconds — ABNORMAL HIGH (ref 11.4–15.2)
Smear Review: NONE SEEN
aPTT: 54 seconds — ABNORMAL HIGH (ref 24–36)

## 2023-02-18 LAB — CBC
HCT: 30.9 % — ABNORMAL LOW (ref 39.0–52.0)
Hemoglobin: 10.3 g/dL — ABNORMAL LOW (ref 13.0–17.0)
MCH: 27.2 pg (ref 26.0–34.0)
MCHC: 33.3 g/dL (ref 30.0–36.0)
MCV: 81.5 fL (ref 80.0–100.0)
Platelets: 125 10*3/uL — ABNORMAL LOW (ref 150–400)
RBC: 3.79 MIL/uL — ABNORMAL LOW (ref 4.22–5.81)
RDW: 15.9 % — ABNORMAL HIGH (ref 11.5–15.5)
WBC: 5.4 10*3/uL (ref 4.0–10.5)
nRBC: 5.6 % — ABNORMAL HIGH (ref 0.0–0.2)

## 2023-02-18 LAB — BODY FLUID CELL COUNT WITH DIFFERENTIAL
Eos, Fluid: 0 %
Lymphs, Fluid: 49 %
Monocyte-Macrophage-Serous Fluid: 5 % — ABNORMAL LOW (ref 50–90)
Neutrophil Count, Fluid: 46 % — ABNORMAL HIGH (ref 0–25)
Total Nucleated Cell Count, Fluid: 135 cu mm (ref 0–1000)

## 2023-02-18 LAB — PROTIME-INR
INR: 1.2 (ref 0.8–1.2)
Prothrombin Time: 15.5 s — ABNORMAL HIGH (ref 11.4–15.2)

## 2023-02-18 LAB — LACTIC ACID, PLASMA: Lactic Acid, Venous: 1.6 mmol/L (ref 0.5–1.9)

## 2023-02-18 LAB — BLOOD GAS, VENOUS
Acid-Base Excess: 5.4 mmol/L — ABNORMAL HIGH (ref 0.0–2.0)
Bicarbonate: 29.9 mmol/L — ABNORMAL HIGH (ref 20.0–28.0)
O2 Saturation: 60.5 %
Patient temperature: 37
pCO2, Ven: 42 mmHg — ABNORMAL LOW (ref 44–60)
pH, Ven: 7.46 — ABNORMAL HIGH (ref 7.25–7.43)
pO2, Ven: 38 mmHg (ref 32–45)

## 2023-02-18 LAB — D-DIMER, QUANTITATIVE: D-Dimer, Quant: 10.53 ug{FEU}/mL — ABNORMAL HIGH (ref 0.00–0.50)

## 2023-02-18 LAB — PROCALCITONIN
Procalcitonin: 130.47 ng/mL
Procalcitonin: 132.63 ng/mL

## 2023-02-18 LAB — MAGNESIUM: Magnesium: 1.7 mg/dL (ref 1.7–2.4)

## 2023-02-18 LAB — BRAIN NATRIURETIC PEPTIDE: B Natriuretic Peptide: 1871.8 pg/mL — ABNORMAL HIGH (ref 0.0–100.0)

## 2023-02-18 LAB — C-REACTIVE PROTEIN
CRP: 10.5 mg/dL — ABNORMAL HIGH (ref <1.0)
CRP: 12.3 mg/dL — ABNORMAL HIGH (ref <1.0)

## 2023-02-18 MED ORDER — IPRATROPIUM-ALBUTEROL 0.5-2.5 (3) MG/3ML IN SOLN
3.0000 mL | Freq: Three times a day (TID) | RESPIRATORY_TRACT | Status: DC
  Administered 2023-02-19 – 2023-02-21 (×7): 3 mL via RESPIRATORY_TRACT
  Filled 2023-02-18 (×7): qty 3

## 2023-02-18 MED ORDER — PIPERACILLIN-TAZOBACTAM IN DEX 2-0.25 GM/50ML IV SOLN
2.2500 g | Freq: Three times a day (TID) | INTRAVENOUS | Status: DC
  Administered 2023-02-18 – 2023-02-19 (×3): 2.25 g via INTRAVENOUS
  Filled 2023-02-18 (×5): qty 50

## 2023-02-18 MED ORDER — POTASSIUM CHLORIDE CRYS ER 20 MEQ PO TBCR
20.0000 meq | EXTENDED_RELEASE_TABLET | Freq: Once | ORAL | Status: DC
  Filled 2023-02-18: qty 1

## 2023-02-18 MED ORDER — FENTANYL CITRATE PF 50 MCG/ML IJ SOSY: 25.0000 ug | PREFILLED_SYRINGE | Freq: Once | INTRAMUSCULAR | Status: DC

## 2023-02-18 MED ORDER — DEXTROSE 5 % IV SOLN: INTRAVENOUS | Status: DC

## 2023-02-18 MED ORDER — DELFLEX-LC/1.5% DEXTROSE 344 MOSM/L IP SOLN: Freq: Once | INTRAPERITONEAL | Status: DC

## 2023-02-18 MED ORDER — DEXTROSE 50 % IV SOLN
INTRAVENOUS | Status: AC
  Administered 2023-02-18: 50 mL
  Filled 2023-02-18: qty 50

## 2023-02-18 MED ORDER — ACETAMINOPHEN 10 MG/ML IV SOLN
1000.0000 mg | Freq: Once | INTRAVENOUS | Status: AC
  Administered 2023-02-18: 1000 mg via INTRAVENOUS
  Filled 2023-02-18: qty 100

## 2023-02-18 MED ORDER — IBUPROFEN 200 MG PO TABS
600.0000 mg | ORAL_TABLET | ORAL | Status: AC
  Administered 2023-02-18: 600 mg via ORAL
  Filled 2023-02-18: qty 3

## 2023-02-18 NOTE — Significant Event (Signed)
Rapid Response Event Note   Code Stroke called at [phone]. For left facial droop, weakness and confusion. LKW some time yesterday per CCM.    On arrival, pt alert and oriented to self and place. Able to follow simple commands. Tremors noted to BUE and weakness in all extremities.   Dr Amada Jupiter to bedside and code stroke cancelled at [phone]. Will get EEG  Mordecai Rasmussen, RN

## 2023-02-18 NOTE — Consult Note (Signed)
6Neurology Consultation Reason for Consult: Code stroke Referring Physician: Delton Coombes, R  CC: Confusion  History is obtained from: Patient, chart review  HPI: [name] [name] is a 72 y.o. male admitted with sepsis due to peritonitis who has been somewhat more confused since dialysis.  He has tremor at baseline, but there was question about some focal tremor earlier and therefore code stroke was activated.  His wife states that he has had tremor since an open heart surgery years ago, but that it gets worse whenever he is tired or anxious.  Due to these findings, neurology was consulted emergently.  LKW: 8/3 tnk given?: no, outside of window  Past Medical History:  Diagnosis Date   Anemia    Arthritis    Asthma    Bell palsy    CAD (coronary artery disease)    a. 2014 MV: abnl w/ infap ischemia; b. 03/2013 Cath: aneurysmal bleb in the LAD w/ otw nonobs dzs-->Med Rx.   Chronic back pain    Chronic knee pain    a. 09/2015 s/p R TKA.   Chronic pain    Chronic shoulder pain    Chronic sinusitis    COPD (chronic obstructive pulmonary disease) (HCC)    Diabetes mellitus without complication (HCC)    type II    ESRD on peritoneal dialysis (HCC)    on peritoneal dialysis, [facility]   Essential hypertension    GERD (gastroesophageal reflux disease)    Gout    Gout    Hepatomegaly    noted on noncontrast CT 2015   History of hiatal hernia    Hyperlipidemia    Lateral meniscus tear    Obesity    Truncal   Obstructive sleep apnea    does not use cpap    On home oxygen therapy    uses 2l when is going somewhere per patient    PUD (peptic ulcer disease)    remote, reports f/u EGD about 8 years ago unremarkable    Reactive airway disease    related to exposure to chemical during 9/11   Sinusitis    Vitamin D deficiency      Family History  Problem Relation Age of Onset   Hypertension Mother        MI   Cancer Mother        breast    Diabetes Mother    Diabetes Father     Hypertension Father    Hypertension Sister    Diabetes Sister    Arthritis Other    Asthma Other    Lung disease Other    Anesthesia problems Neg Hx    Hypotension Neg Hx    Malignant hyperthermia Neg Hx    Pseudochol deficiency Neg Hx    Colon cancer Neg Hx      Social History:  reports that he quit smoking about 12 years ago. His smoking use included cigarettes. He started smoking about 37 years ago. He has a 25 pack-year smoking history. He has never used smokeless tobacco. He reports that he does not currently use alcohol. He reports current drug use. Drug: Marijuana.   Exam: Current vital signs: BP (!) 142/112   Pulse 74   Temp (!) 101.5 F (38.6 C)   Resp (!) 27   Ht 5\' 6"  (1.676 m)   Wt 99.6 kg   SpO2 97%   BMI 35.44 kg/m  Vital signs in last 24 hours: Temp:  [98.6 F (37 C)-101.5 F (38.6 C)]  101.5 F (38.6 C) (08/04 1109) Pulse Rate:  [64-88] 74 (08/04 0801) Resp:  [22-33] 27 (08/04 0830) BP: (71-177)/(54-144) 142/112 (08/04 0830) SpO2:  [89 %-100 %] 97 % (08/04 0830) FiO2 (%):  [40 %] 40 % (08/04 0500)   Physical Exam  Appears well-developed and well-nourished.   Neuro: Mental Status: Patient is awake, alert, oriented to person, place, year, and situation. He does have some mild confusion, but no aphasia. Cranial Nerves: II: Visual Fields are full. Pupils are equal, round, and reactive to light.   III,IV, VI: EOMI without ptosis or diploplia.  V: Facial sensation is symmetric to temperature VII: Facial movement is symmetric.  VIII: hearing is intact to voice X: Uvula elevates symmetrically XI: Shoulder shrug is symmetric. XII: tongue is midline without atrophy or fasciculations.  Motor: He is able to hold all extremities without drift.  He has a coarse resting tremor in all four extremities almost appearing polymyoclonic.  He is able to voluntarily suppress this for brief periods, but it returns. Sensory: Sensation is symmetric to light touch  and temperature in the arms and legs. Cerebellar: No definite ataxia     I have reviewed labs in epic and the results pertinent to this consultation are: Procalcitonin 132 Elevated LFTs  I have reviewed the images obtained: CT head from 8/1 was negative  Impression: 72 year old male with encephalopathy in the setting of peritonitis and sepsis.  I suspect his tremor is likely related to his underlying tremor with worsening in the setting of sepsis.  I suspect his encephalopathy is likewise a septic encephalopathy.  Could consider rechecking an ammonia given his elevated LFTs.  At this time, I do not feel strongly about imaging, but if he were not to improve, could consider MRI of his brain in the later time.  EEG is pending.  Recommendations: 1) follow-up EEG 2) consider ammonia considering elevated LFTs 3) neurology will be available as needed, please call with further questions or concerns.  Ritta Slot, MD [facility] [phone]  If 7pm- 7am, please page neurology on call as listed in AMION.

## 2023-02-18 NOTE — Progress Notes (Signed)
   02/18/23 2301  BiPAP/CPAP/SIPAP  $ Non-Invasive Ventilator  Non-Invasive Vent Initial  $ Face Mask Large  Yes  BiPAP/CPAP/SIPAP Pt Type Adult  BiPAP/CPAP/SIPAP V60  Mask Type Full face mask  Mask Size Large  Set Rate 10 breaths/min  Respiratory Rate 18 breaths/min  IPAP 10 cmH20  EPAP 5 cmH2O  FiO2 (%) 30 %  Minute Ventilation 11.3  Leak 15  Peak Inspiratory Pressure (PIP) 9  Tidal Volume (Vt) 560  Patient Home Equipment No  Auto Titrate No  Press High Alarm 25 cmH2O  Press Low Alarm 5 cmH2O  BiPAP/CPAP /SiPAP Vitals  Pulse Rate 73  Resp 18  BP 132/65  SpO2 99 %  MEWS Score/Color  MEWS Score 0  MEWS Score Color [name] [name]

## 2023-02-18 NOTE — Consult Note (Signed)
NAME:  [name] [name], MRN:  [id number], DOB:  [birth year], LOS: 3 ADMISSION DATE:  02/14/2023, CONSULTATION DATE:  02/18/23 REFERRING MD:  Reggie Pile of [facility] X-cver, CHIEF COMPLAINT:  SIRS, sepsis, encephalopathy resp distress    History of Present Illness:   72 year old BMI 35.5 obese male with a history of bypass in 2023, COPD not otherwise specified, type 2 diabetes mellitus, acid reflux, ESRD on peritoneal dialysis, hypotension on midodrine.  Sometime mid June 2024 had COVID-19 and treated with Augmentin.  Subsequent to that had cough and poor oral intake and lethargy.  Despite t his mid juoy 2-24 - 1000 feeet in 5 min on room air without desaturation.   Presented to [facility] on 02/14/2023 for confusion and worsening shortness of breath and hypersomnolence.  At the time of admission is requiring 2 L nasal cannula and he had transaminitis.  Chest x-ray suggested pneumonia and a procalcitonin over 150 with a fever of 102.  Started on broad antibiotics and transferred to [facility] campus.  Working diagnosis was peritonitis from his peritoneal dialysis catheter based on cell count 2200 and hazy fluid in the peritoneal dialysate  Subsequent course - 02/16/2023 felt to be responding to antibiotics more awake.  Nursing noticed him to be ambulatory to the bathroom.  Home Xanax and Neurontin were being held.  For his symptomatic transaminitis he had right upper quadrant ultrasound that suggested cirrhosis.  He did have peritoneal dialysis on 02/16/2023  -02/17/2023: Malfunction of peritoneal dialysis catheter with complaints of abdominal pain.  Has subsequent status post temporary HD catheter right internal jugular vein.  He then underwent emergent dialysis and returned back to the unit at around 10 PM 02/17/2023.  Nursing noticed marked difference.  He was less interactive.  He was alert and having a gaze but not following commands.  He would mumble a word.  His temperature was  101.5.  Tachypneic having tremors oxygen needs now at 5 L [.  Hemodialysis was at 4 L] and saturating 100% status post -1.2 L on hemodialysis.  Chest x-ray done showed worsening pulm infiltrates particularly lower lobes compared to admission.  Critical care medicine consulted.  By hospitalist.   Significant Hospital Events:  02/14/2023 - admit - 02/16/2023 felt to be responding to antibiotics more awake.  Nursing noticed him to be ambulatory to the bathroom.  Home Xanax and Neurontin were being held.  For his symptomatic transaminitis he had right upper quadrant ultrasound that suggested cirrhosis.  He did have peritoneal dialysis on 02/16/2023  -02/17/2023: Malfunction of peritoneal dialysis catheter with complaints of abdominal pain.  Has subsequent status post temporary HD catheter right internal jugular vein.  He then underwent emergent dialysis and returned back to the unit at around 10 PM 02/17/2023.  Nursing noticed marked difference.  He was less interactive.  He was alert and having a gaze but not following commands.  He would mumble a word.  His temperature was 101.5.  Tachypneic having tremors oxygen needs now at 5 L [.  Hemodialysis was at 4 L] and saturating 100% status post -1.2 L on hemodialysis.  Chest x-ray done showed worsening pulm infiltrates particularly lower lobes compared to admission.  Critical care medicine consulted.  By hospitalis  02/18/23: early hours - CCM consult  Interim History / Subjective:  Persistent tremor, confusion this morning Protecting airway Hemodynamically > evolving hypertension  Objective   Blood pressure (!) 142/112, pulse 74, temperature (!) 101.5 F (38.6 C), resp.  rate (!) 27, height 5\' 6"  (1.676 m), weight 99.6 kg, SpO2 97%.    FiO2 (%):  [40 %] 40 %   Intake/Output Summary (Last 24 hours) at 02/18/2023 1140 Last data filed at 02/17/2023 2121 Gross per 24 hour  Intake --  Output 1200 ml  Net -1200 ml   Filed Weights   02/16/23 0042 02/16/23 2023  02/17/23 0500  Weight: 101.1 kg 97.7 kg 99.6 kg    Examination: General: Obese man, tremulous, awake HENT: Oropharynx clear.  Protecting airway.  No secretions.  Pupils equal Lungs: Tachypneic but good air movement.  No crackles or wheezes Cardiovascular: Irregular, frequent PVC, no murmur Abdomen: Obese, somewhat distended, generalized diffuse tenderness.  PD catheter in the left upper quadrant Extremities: 1+ lower extremity edema Neuro: Right upper extremity and shoulder, facial tremor.  Awake, appears to track.  A bit slow to answer questions but will answer.  Oriented to self, place but not time.  Follows commands.  Question very slight right facial droop but tongue is midline, moves normally.  Both lower extremities weak, does not raise against gravity.  Moves upper extremities against gravity, again some weakness. GU: Deferred  Resolved Hospital Problem list   x  Assessment & Plan:   Sepsis with encephalopathy, rigors.  Suspect source is peritonitis from his PD and PD catheter.  Consider pneumonia/HCAP but no corresponding clinical symptoms. -Agree with vancomycin, Zosyn -Follow culture data -Trend lactic acid -Planning for repeat cell count on peritoneal fluid to compare with presentation.  I suspect that the PD catheter will need to be removed  Acute metabolic encephalopathy with confusion at admission 7/3 and 8/1 and subsequent improvement with treatment of peritonitis.  Recurrent confusion again 02/17/2023 after hemodialysis.  Suspect continued septic encephalopathy, possibly complicated by volume shifts from HD -Continue supportive care, treatment of underlying sepsis -Minimize sedating medications including his home Xanax, home gabapentin -HD as per nephrology plans  Acute hypoxemic respiratory failure with bibasilar infiltrates on CT and chest x-ray.  Consider volume overload, acute lung injury due to sepsis, possibly also pneumonia although no clinical history of cough,  sputum, etc. Encephalopathy and airway protection also playing a role here -Follow chest x-ray -Wean FiO2 as able -He is protecting airway, does not require intubation at this time -Will likely benefit from volume removal with his dialysis -BiPAP if needed, no indication at this time -Probably can reinitiate oral meds, diet -RVP pending   End-stage renal disease on peritoneal dialysis in  Insertion of new hemodialysis catheter 8/3 and status post first dialysis 8/3 -HD as per nephrology plans and based on his ability to tolerate hemodynamically.  He would benefit from volume removal -Patient's wife's goal is to continue on PD, preserve the peritoneal dialysis catheter if possible.  Unclear whether he will recover from sepsis if the catheter remains in place.  Will continue to follow closely, decide over the next 24 hours  Tremor, possible face symmetry -EEG ordered -Case reviewed with neurology.  On exam most consistent with metabolic encephalopathy as opposed to focal process, CVA.  Per the patient's wife he has baseline tremor at home, worsens when he is ill.  This presentation is consistent with what she has seen in the past  Transaminitis, suspect shock liver -Follow LFT, follow coag   Best practice (daily eval):  Diet: npo, will attempt to restart diet 8/4 Pain/Anxiety/Delirium protocol (if indicated): x VAP protocol (if indicated): x DVT prophylaxis: heparin sq GI prophylaxis:  PPI Glucose control: SSI +  semglee Mobility: bed rest Code Status: full code Family Communication: status and plans reviewed with pt's wife 8/4 Disposition: ICU     ATTESTATION & SIGNATURE   The patient DERMOT GREMILLION is critically ill with multiple organ systems failure and requires high complexity decision making for assessment and support, frequent evaluation and titration of therapies, application of advanced monitoring technologies and extensive interpretation of multiple databases.    Critical Care Time devoted to patient care services described in this note is 60 Minutes. This time reflects time of care of this signee Dr Levy Pupa. This critical care time does not reflect procedure time, or teaching time or supervisory time of PA/NP/Med student/Med Resident etc but could involve care discussion time    SIGNATURE   Levy Pupa, MD, PhD 02/18/2023, 12:06 PM [facility] [phone] or if no answer before 7:00PM call [phone] For any issues after 7:00PM please call eLink [phone]   LABS    PULMONARY Recent Labs  Lab 02/15/23 0827 02/18/23 0154  HCO3 27.6 29.9*  O2SAT 36.8 60.5    CBC Recent Labs  Lab 02/17/23 0351 02/17/23 2332 02/18/23 0425  HGB 10.4* 10.3* 9.8*  HCT 32.3* 30.9* 29.8*  WBC 6.7 5.4 5.1  PLT 139* 125* 126*  116*    COAGULATION Recent Labs  Lab 02/15/23 0827 02/16/23 0117 02/17/23 2332 02/18/23 0425  INR 1.2 1.3* 1.2 1.2    CARDIAC  No results for input(s): "TROPONINI" in the last 168 hours. No results for input(s): "PROBNP" in the last 168 hours.  CHEMISTRY Recent Labs  Lab 02/15/23 0827 02/16/23 0117 02/17/23 0351 02/17/23 2332 02/18/23 0425  NA 132* 130* 131* 133* 132*  K 4.0 4.3 4.3 3.3* 3.1*  CL 90* 91* 90* 92* 93*  CO2 24 19* 23 25 26   GLUCOSE 126* 284* 210* 154* 92  BUN 49* 57* 69* 35* 38*  CREATININE 13.32* 14.71* 15.03* 8.36* 9.61*  CALCIUM 7.8* 7.2* 7.3* 7.3* 7.2*  MG  --   --  1.8  --  1.7  PHOS  --  8.2*  --   --   --    Estimated Creatinine Clearance: 7.7 mL/min (A) (by C-G formula based on SCr of 9.61 mg/dL (H)).   LIVER Recent Labs  Lab 02/15/23 0827 02/16/23 0117 02/17/23 0351 02/17/23 2332 02/18/23 0425  AST 4,152* 2,628* 1,252* 635* 520*  ALT 2,053* 1,431* 1,018* 686* 594*  ALKPHOS 111 99 127* 152* 142*  BILITOT 1.0 0.8 0.7 1.3* 1.0  PROT 5.9* 5.3* 5.2* 5.3* 5.1*  ALBUMIN 1.9* <1.5* 1.6* 1.6* <1.5*  INR 1.2 1.3*  --  1.2 1.2      INFECTIOUS Recent Labs  Lab 02/17/23 0351 02/17/23 2332 02/18/23 0425  LATICACIDVEN  --  1.6  --   PROCALCITON >150.00 130.47 132.63     ENDOCRINE CBG (last 3)  Recent Labs    02/18/23 0757 02/18/23 0824 02/18/23 1051  GLUCAP 74 71 80         IMAGING x48h  - image(s) personally visualized  -   highlighted in bold EEG adult  Result Date: 02/18/2023 Windell Norfolk, MD     02/18/2023  9:49 AM Patient Name: [name] [name] MRN: [id number] Epilepsy Attending: Windell Norfolk Referring Physician/Provider: Dr. Delton Coombes Date: 02/18/2023 Duration: 23 minutes Patient history: 78 year man with sepsis and AMS,  EEG to evaluate for seizure Level of alertness: lethargic AEDs during EEG study: None Technical aspects: This EEG study was done with scalp  electrodes positioned according to the 10-20 International system of electrode placement. Electrical activity was reviewed with band pass filter of 1-70Hz , sensitivity of 7 uV/mm, display speed of 14mm/sec with a 60Hz  notched filter applied as appropriate. EEG data were recorded continuously and digitally stored.  Video monitoring was available and reviewed as appropriate. Description: EEG showed continuous/intermittent generalized polymorphic sharply contoured 3 to 6 Hz theta-delta slowing. Sleep was not seen. Hyperventilation and photic stimulation were not performed. No seizure or epileptiform discharges seen during this recording. ABNORMALITY - Continuous slow, generalized IMPRESSION: This study is suggestive of mild to moderate diffuse encephalopathy, nonspecific etiology but likely related to sedation, toxic-metabolic etiology. [name] [name]   DG CHEST PORT 1 VIEW  Result Date: 02/17/2023 CLINICAL DATA:  Respiratory distress, shaking. EXAM: PORTABLE CHEST 1 VIEW COMPARISON:  02/16/2023. FINDINGS: The heart size and mediastinal contours are stable. There is atherosclerotic calcification of the aorta. Lung volumes are low and mild airspace disease is  noted in the mid to lower lung fields bilaterally. No effusion or pneumothorax. A right internal jugular central venous catheter terminates over the right atrium. Sternotomy wires are present over the midline. IMPRESSION: 1. Mild airspace opacities in the mid to lower lung fields, possible multifocal pneumonia. 2. Right internal jugular central venous catheter terminating in the right atrium. Electronically Signed   By: Thornell Sartorius M.D.   On: 02/17/2023 23:56   IR Fluoro Guide CV Line Right  Result Date: 02/17/2023 INDICATION: Acute renal failure EXAM: Ultrasound fluoroscopy guided temporary right IJ hemodialysis catheter placement MEDICATIONS: None ANESTHESIA/SEDATION: None FLUOROSCOPY: Radiation Exposure Index (as provided by the fluoroscopic device): 5 mGy Kerma COMPLICATIONS: None immediate. PROCEDURE: Informed written consent was obtained from the patient after a thorough discussion of the procedural risks, benefits and alternatives. All questions were addressed. Maximal Sterile Barrier Technique was utilized including caps, mask, sterile gowns, sterile gloves, sterile drape, hand hygiene and skin antiseptic. A timeout was performed prior to the initiation of the procedure. Right neck prepped and draped in the usual sterile fashion. All elements of maximal sterile barrier were utilized including, cap, mask, sterile gown, sterile gloves, large sterile drape, hand scrubbing and 2% Chlorhexidine for skin cleaning. The right internal jugular vein was evaluated with ultrasound and shown to be patent. A permanent ultrasound image was obtained and placed in the patient's medical record. Using sterile gel and a sterile probe cover, the right internal jugular vein was entered with a 21 ga needle during real time ultrasound guidance. 0.018 inch guidewire placed and 21 ga needle exchanged for transitional dilator set. Utilizing fluoroscopy, 0.035 inch guidewire advanced through the dilator without difficulty. Serial  dilation performed, and catheter inserted over the guidewire. The tip was positioned in the right atrium. All lumens of the catheter aspirated and flushed well. The dialysis lumens were locked with Heparin. The catheter was secured to the skin with suture. The insertion site was covered with sterile dressing. IMPRESSION: Temporary right IJ hemodialysis catheter (20 cm) is ready for use. Electronically Signed   By: Acquanetta Belling M.D.   On: 02/17/2023 11:25   IR US Guide Vasc Access Right  Result Date: 02/17/2023 INDICATION: Acute renal failure EXAM: Ultrasound fluoroscopy guided temporary right IJ hemodialysis catheter placement MEDICATIONS: None ANESTHESIA/SEDATION: None FLUOROSCOPY: Radiation Exposure Index (as provided by the fluoroscopic device): 5 mGy Kerma COMPLICATIONS: None immediate. PROCEDURE: Informed written consent was obtained from the patient after a thorough discussion of the procedural risks, benefits and alternatives. All questions were addressed. Maximal  Sterile Barrier Technique was utilized including caps, mask, sterile gowns, sterile gloves, sterile drape, hand hygiene and skin antiseptic. A timeout was performed prior to the initiation of the procedure. Right neck prepped and draped in the usual sterile fashion. All elements of maximal sterile barrier were utilized including, cap, mask, sterile gown, sterile gloves, large sterile drape, hand scrubbing and 2% Chlorhexidine for skin cleaning. The right internal jugular vein was evaluated with ultrasound and shown to be patent. A permanent ultrasound image was obtained and placed in the patient's medical record. Using sterile gel and a sterile probe cover, the right internal jugular vein was entered with a 21 ga needle during real time ultrasound guidance. 0.018 inch guidewire placed and 21 ga needle exchanged for transitional dilator set. Utilizing fluoroscopy, 0.035 inch guidewire advanced through the dilator without difficulty. Serial  dilation performed, and catheter inserted over the guidewire. The tip was positioned in the right atrium. All lumens of the catheter aspirated and flushed well. The dialysis lumens were locked with Heparin. The catheter was secured to the skin with suture. The insertion site was covered with sterile dressing. IMPRESSION: Temporary right IJ hemodialysis catheter (20 cm) is ready for use. Electronically Signed   By: Acquanetta Belling M.D.   On: 02/17/2023 11:25   DG Abd 1 View  Result Date: 02/17/2023 CLINICAL DATA:  End-stage renal disease. EXAM: ABDOMEN - 1 VIEW COMPARISON:  No comparison studies available. FINDINGS: The bowel gas pattern is normal. No radio-opaque calculi or other significant radiographic abnormality are seen. Peritoneal dialysis catheter overlies the lower abdomen. IMPRESSION: Negative. Electronically Signed   By: Kennith Center M.D.   On: 02/17/2023 10:41

## 2023-02-18 NOTE — Progress Notes (Signed)
Pt pulling at BiPAP mask and stated he feels like he can't breath with it on.  Pressures adjusted multiple times but pt still stated he can tolerate it.  Pt taken off BIPAP and placed back on 4L [place].

## 2023-02-18 NOTE — Progress Notes (Signed)
   02/18/23 0155  BiPAP/CPAP/SIPAP  $ Non-Invasive Ventilator  Non-Invasive Vent Initial  $ Face Mask Large  Yes  BiPAP/CPAP/SIPAP Pt Type Adult  BiPAP/CPAP/SIPAP V60  Mask Type Full face mask  Mask Size Large  Set Rate 10 breaths/min  Respiratory Rate 24 breaths/min  IPAP 12 cmH20  EPAP 8 cmH2O  FiO2 (%) 40 %  Minute Ventilation 11.5  Leak 18  Peak Inspiratory Pressure (PIP) 12  Tidal Volume (Vt) 476  Patient Home Equipment No  Auto Titrate No  Press High Alarm 25 cmH2O  Press Low Alarm 5 cmH2O  CPAP/SIPAP surface wiped down Yes  BiPAP/CPAP /SiPAP Vitals  Pulse Rate 84  Resp (!) 28  SpO2 (!) 89 %  MEWS Score/Color  MEWS Score 3  MEWS Score Color Yellow

## 2023-02-18 NOTE — Progress Notes (Signed)
   02/18/23 0616  BiPAP/CPAP/SIPAP  $ Non-Invasive Ventilator  Non-Invasive Vent Initial  $ Face Mask Large  Yes  BiPAP/CPAP/SIPAP Pt Type Adult  BiPAP/CPAP/SIPAP V60  Mask Type Full face mask  Mask Size Large  Set Rate 10 breaths/min  Respiratory Rate 24 breaths/min  IPAP 12 cmH20  EPAP 8 cmH2O  FiO2 (%) 40 %  Minute Ventilation 11.5  Leak 18  Peak Inspiratory Pressure (PIP) 12  Tidal Volume (Vt) 476  Patient Home Equipment No  Auto Titrate No  Press High Alarm 25 cmH2O  Press Low Alarm 5 cmH2O  CPAP/SIPAP surface wiped down Yes  BiPAP/CPAP /SiPAP Vitals  SpO2 97 %  Bilateral Breath Sounds Diminished

## 2023-02-18 NOTE — Plan of Care (Signed)
  Problem: Education: Goal: Knowledge of disease or condition will improve Outcome: Progressing Goal: Knowledge of the prescribed therapeutic regimen will improve Outcome: Progressing Goal: Individualized Educational Video(s) Outcome: Progressing   Problem: Activity: Goal: Will verbalize the importance of balancing activity with adequate rest periods Outcome: Progressing   Problem: Respiratory: Goal: Ability to maintain a clear airway will improve Outcome: Progressing Goal: Levels of oxygenation will improve Outcome: Progressing Goal: Ability to maintain adequate ventilation will improve Outcome: Progressing   Problem: Education: Goal: Ability to describe self-care measures that may prevent or decrease complications (Diabetes Survival Skills Education) will improve Outcome: Progressing Goal: Individualized Educational Video(s) Outcome: Progressing   Problem: Coping: Goal: Ability to adjust to condition or change in health will improve Outcome: Progressing   Problem: Fluid Volume: Goal: Ability to maintain a balanced intake and output will improve Outcome: Progressing   Problem: Health Behavior/Discharge Planning: Goal: Ability to identify and utilize available resources and services will improve Outcome: Progressing Goal: Ability to manage health-related needs will improve Outcome: Progressing   Problem: Metabolic: Goal: Ability to maintain appropriate glucose levels will improve Outcome: Progressing   Problem: Nutritional: Goal: Maintenance of adequate nutrition will improve Outcome: Progressing Goal: Progress toward achieving an optimal weight will improve Outcome: Progressing   Problem: Skin Integrity: Goal: Risk for impaired skin integrity will decrease Outcome: Progressing   Problem: Tissue Perfusion: Goal: Adequacy of tissue perfusion will improve Outcome: Progressing   Problem: Education: Goal: Knowledge of General Education information will  improve Description: Including pain rating scale, medication(s)/side effects and non-pharmacologic comfort measures Outcome: Progressing   Problem: Health Behavior/Discharge Planning: Goal: Ability to manage health-related needs will improve Outcome: Progressing   Problem: Clinical Measurements: Goal: Ability to maintain clinical measurements within normal limits will improve Outcome: Progressing Goal: Will remain free from infection Outcome: Progressing Goal: Diagnostic test results will improve Outcome: Progressing Goal: Respiratory complications will improve Outcome: Progressing Goal: Cardiovascular complication will be avoided Outcome: Progressing   Problem: Coping: Goal: Level of anxiety will decrease Outcome: Progressing   Problem: Elimination: Goal: Will not experience complications related to bowel motility Outcome: Progressing Goal: Will not experience complications related to urinary retention Outcome: Progressing   Problem: Pain Managment: Goal: General experience of comfort will improve Outcome: Progressing   Problem: Safety: Goal: Ability to remain free from injury will improve Outcome: Progressing   Problem: Skin Integrity: Goal: Risk for impaired skin integrity will decrease Outcome: Progressing

## 2023-02-18 NOTE — Progress Notes (Signed)
   02/18/23 1817  Peritoneal Catheter Left lower abdomen  Placement Date/Time: 10/07/18 1610   Time out: Correct Patient;Correct Site;Correct Procedure;Special equipment/requirements available  Catheter Location: Left lower abdomen  Serial / Lot #: [id number]  Expiration Date: 11/14/22  Site Assessment Clean, Dry, Intact  Drainage Description None  Catheter status Clamped  Dressing Gauze/Drain sponge;Occlusive  Dressing Status Clean, Dry, Intact  Dressing Intervention Dressing reinforced  Initial Treatment  Volume in Prior to Admission (mL) 1000 kg  First Volume Out (mL) 800 kg  Effluent Appearance Amber;Clear  Effluent to Lab Yes  Peritoneal Dialysis-Flush  Dianeal Solution Dextrose 1.5% in 2000 mL Low Cal/Low Mag  Flush Volume In 1000 ml  Flush Volume Out 800 ml  Effluent appearance Amber;Clear  Tolerated treatment well? Yes  Flush Comments Pt c/o abdomen pain.  Peritoneal Dialysis - Drain  Drain Exchange Number 1  Effluent Volume Out (mL) 800 ml  Effluent Appearance Amber;Clear  Effluent to Lab Yes  Fluid Balance - CAPD  Intake this Exchange (mL) 1000 ml  Output this Exchange (mL) 800 ml  Procedure Comments  Tolerated treatment well? Yes  Hand-off documentation  Report given to (Full Name) Georgeanne Nim. Manda RN

## 2023-02-18 NOTE — Progress Notes (Signed)
Lower extremity venous bilateral study completed.   Please see CV Proc for preliminary results.    , RDMS, RVT  

## 2023-02-18 NOTE — Procedures (Signed)
Patient Name: [name] [name]  MRN: [id number]  Epilepsy Attending: Windell Norfolk  Referring Physician/Provider: Dr. Delton Coombes  Date: 02/18/2023 Duration: 23 minutes   Patient history: 36 year man with sepsis and AMS,  EEG to evaluate for seizure   Level of alertness: lethargic   AEDs during EEG study: None   Technical aspects: This EEG study was done with scalp electrodes positioned according to the 10-20 International system of electrode placement. Electrical activity was reviewed with band pass filter of 1-70Hz , sensitivity of 7 uV/mm, display speed of 18mm/sec with a 60Hz  notched filter applied as appropriate. EEG data were recorded continuously and digitally stored.  Video monitoring was available and reviewed as appropriate.  Description: EEG showed continuous/intermittent generalized polymorphic sharply contoured 3 to 6 Hz theta-delta slowing. Sleep was not seen. Hyperventilation and photic stimulation were not performed. No seizure or epileptiform discharges seen during this recording.  ABNORMALITY - Continuous slow, generalized   IMPRESSION: This study is suggestive of mild to moderate diffuse encephalopathy, nonspecific etiology but likely related to sedation, toxic-metabolic etiology.    

## 2023-02-18 NOTE — Progress Notes (Signed)
CODE Stroke called on patient. EEG Techs on standby.

## 2023-02-18 NOTE — Progress Notes (Signed)
Patient returned from dialysis with significant tremors, temp 101.5, HR 80's Afib, BP 158/92, Resp 29, alert but not verbally responding.  Secure chatted Dr. Loney Loh regarding condition and requesting something to decrease temp. Dr. Marchelle Gearing was consulted and came to see pt at bedside.  Multiple new orders, IV tylenol for fever.  ABG and bipap ordered.

## 2023-02-18 NOTE — Progress Notes (Signed)
Patient hasreally bad tremors at the moment. Stuck patient twice for abg and each time patient moved and cried out in pain. Was unable to obtain abg. Patient was on 5L [place] and satting 97%. Asked the nurse to ask the Dr. To get a VBG.

## 2023-02-18 NOTE — Progress Notes (Signed)
EEG complete - results pending.   GMD/ TM 

## 2023-02-18 NOTE — Consult Note (Signed)
NAME:  [name] [name], MRN:  [id number], DOB:  [birth year], LOS: 3 ADMISSION DATE:  02/14/2023, CONSULTATION DATE:  02/18/23 REFERRING MD:  Reggie Pile of [facility] MD X-cver, CHIEF COMPLAINT:  SIRS, sepsis, encephalopathy resp distress    History of Present Illness:   72 year old BMI 35.5 obese male with a history of bypass in 2023, COPD not otherwise specified, type 2 diabetes mellitus, acid reflux, ESRD on peritoneal dialysis, hypotension on midodrine.  Sometime mid June 2024 had COVID-19 and treated with Augmentin.  Subsequent to that had cough and poor oral intake and lethargy.  Despite t his mid juoy 2-24 - 1000 feeet in 5 min on room air without desaturation.   Presented to [facility] on 02/14/2023 for confusion and worsening shortness of breath and hypersomnolence.  At the time of admission is requiring 2 L nasal cannula and he had transaminitis.  Chest x-ray suggested pneumonia and a procalcitonin over 150 with a fever of 102.  Started on broad antibiotics and transferred to [facility].  Working diagnosis was peritonitis from his peritoneal dialysis catheter based on cell count 2200 and hazy fluid in the peritoneal dialysate  Subsequent course - 02/16/2023 felt to be responding to antibiotics more awake.  Nursing noticed him to be ambulatory to the bathroom.  Home Xanax and Neurontin were being held.  For his symptomatic transaminitis he had right upper quadrant ultrasound that suggested cirrhosis.  He did have peritoneal dialysis on 02/16/2023  -02/17/2023: Malfunction of peritoneal dialysis catheter with complaints of abdominal pain.  Has subsequent status post temporary HD catheter right internal jugular vein.  He then underwent emergent dialysis and returned back to the unit at around 10 PM 02/17/2023.  Nursing noticed marked difference.  He was less interactive.  He was alert and having a gaze but not following commands.  He would mumble a word.  His temperature was  101.5.  Tachypneic having tremors oxygen needs now at 5 L [.  Hemodialysis was at 4 L] and saturating 100% status post -1.2 L on hemodialysis.  Chest x-ray done showed worsening pulm infiltrates particularly lower lobes compared to admission.  Critical care medicine consulted.  By hospitalist.   Past Medical History:    has a past medical history of Anemia, Arthritis, Asthma, Bell palsy, CAD (coronary artery disease), Chronic back pain, Chronic knee pain, Chronic pain, Chronic shoulder pain, Chronic sinusitis, COPD (chronic obstructive pulmonary disease) (HCC), Diabetes mellitus without complication (HCC), ESRD on peritoneal dialysis (HCC), Essential hypertension, GERD (gastroesophageal reflux disease), Gout, Gout, Hepatomegaly, History of hiatal hernia, Hyperlipidemia, Lateral meniscus tear, Obesity, Obstructive sleep apnea, On home oxygen therapy, PUD (peptic ulcer disease), Reactive airway disease, Sinusitis, and Vitamin D deficiency.   reports that he quit smoking about 12 years ago. His smoking use included cigarettes. He started smoking about 37 years ago. He has a 25 pack-year smoking history. He has never used smokeless tobacco.  Past Surgical History:  Procedure Laterality Date   ASAD LT SHOULDER  12/15/2008   left shoulder   AV FISTULA PLACEMENT Left 08/09/2016   Procedure: BRACHIOCEPHALIC ARTERIOVENOUS (AV) FISTULA CREATION LEFT ARM;  Surgeon: Sherren Kerns, MD;  Location: [facility] OR;  Service: Vascular;  Laterality: Left;   CAPD INSERTION N/A 10/07/2018   Procedure: LAPAROSCOPIC PERITONEAL CATHETER PLACEMENT;  Surgeon: Rodman Pickle, MD;  Location: [facility] ORS;  Service: General;  Laterality: N/A;   CATARACT EXTRACTION W/PHACO Left 03/28/2016   Procedure: CATARACT EXTRACTION PHACO AND INTRAOCULAR LENS  PLACEMENT LEFT EYE;  Surgeon: Jethro Bolus, MD;  Location: [facility] ORS;  Service: Ophthalmology;  Laterality: Left;  CDE: 4.77   CATARACT EXTRACTION W/PHACO Right 04/11/2016   Procedure:  CATARACT EXTRACTION PHACO AND INTRAOCULAR LENS PLACEMENT RIGHT EYE; CDE:  4.74;  Surgeon: Jethro Bolus, MD;  Location: [facility] ORS;  Service: Ophthalmology;  Laterality: Right;   COLONOSCOPY  10/15/2008   Fields: Rectal polyp obliterated, not retrieved, hemorrhoids, single ascending colon diverticulum near the CV. Next colonoscopy April 2020   COLONOSCOPY N/A 12/25/2014   SLF: 1. Colorectal polyps (2) removed 2. Small internal hemorrhoids 3. the left colon is severely redundant. hyperplastic polyps   CORONARY ARTERY BYPASS GRAFT N/A 06/05/2022   Procedure: OFF PUMP CORONARY ARTERY BYPASS GRAFTING (CABG) X 2 BYPASSES USING LEFT INTERNAL MAMMARY ARTERY AND RIGHT LEG GREATER SAPHENOUS VEIN HARVESTED ENDOSCOPICALLY;  Surgeon: Corliss Skains, MD;  Location: [facility] OR;  Service: Open Heart Surgery;  Laterality: N/A;   CORONARY STENT INTERVENTION N/A 07/25/2021   Procedure: CORONARY STENT INTERVENTION;  Surgeon: Tonny Bollman, MD;  Location: [facility] INVASIVE CV LAB;  Service: Cardiovascular;  Laterality: N/A;   CORONARY STENT INTERVENTION N/A 12/26/2021   Procedure: CORONARY STENT INTERVENTION;  Surgeon: Yvonne Kendall, MD;  Location: [facility] INVASIVE CV LAB;  Service: Cardiovascular;  Laterality: N/A;   CORONARY STENT INTERVENTION N/A 01/20/2022   Procedure: CORONARY STENT INTERVENTION;  Surgeon: Tonny Bollman, MD;  Location: [facility] INVASIVE CV LAB;  Service: Cardiovascular;  Laterality: N/A;   DOPPLER ECHOCARDIOGRAPHY     ESOPHAGOGASTRODUODENOSCOPY N/A 12/25/2014   SLF: 1. Anemia most likely due to CRI, gastritis, gastric polyps 2. Moderate non-erosive gastriits and mild duodenitis.  3.TWo large gstric polyps removed.    EYE SURGERY  12/22/2010   tear duct probing-[place]   FOREIGN BODY REMOVAL  03/29/2011   Procedure: REMOVAL FOREIGN BODY EXTREMITY;  Surgeon: Fuller Canada, MD;  Location: [facility]S;  Service: Orthopedics;  Laterality: Right;  Removal Foreign Body Right Thumb   IR FLUORO GUIDE CV LINE RIGHT   08/06/2018   IR FLUORO GUIDE CV LINE RIGHT  02/17/2023   IR US GUIDE VASC ACCESS RIGHT  08/06/2018   IR US GUIDE VASC ACCESS RIGHT  02/17/2023   KNEE ARTHROSCOPY  10/16/2007   left   KNEE ARTHROSCOPY WITH LATERAL MENISECTOMY Right 10/14/2015   Procedure: LEFT KNEE ARTHROSCOPY WITH PARTIAL LATERAL MENISECTOMY;  Surgeon: Vickki Hearing, MD;  Location: [facility] ORS;  Service: Orthopedics;  Laterality: Right;   LEFT HEART CATH AND CORONARY ANGIOGRAPHY N/A 07/25/2021   Procedure: LEFT HEART CATH AND CORONARY ANGIOGRAPHY;  Surgeon: Tonny Bollman, MD;  Location: [facility] INVASIVE CV LAB;  Service: Cardiovascular;  Laterality: N/A;   LEFT HEART CATH AND CORONARY ANGIOGRAPHY N/A 12/26/2021   Procedure: LEFT HEART CATH AND CORONARY ANGIOGRAPHY;  Surgeon: Yvonne Kendall, MD;  Location: [facility] INVASIVE CV LAB;  Service: Cardiovascular;  Laterality: N/A;   LEFT HEART CATH AND CORONARY ANGIOGRAPHY N/A 01/20/2022   Procedure: LEFT HEART CATH AND CORONARY ANGIOGRAPHY;  Surgeon: Tonny Bollman, MD;  Location: [facility] INVASIVE CV LAB;  Service: Cardiovascular;  Laterality: N/A;   LEFT HEART CATHETERIZATION WITH CORONARY ANGIOGRAM N/A 03/28/2013   Procedure: LEFT HEART CATHETERIZATION WITH CORONARY ANGIOGRAM;  Surgeon: Marykay Lex, MD;  Location: [facility] CATH LAB;  Service: Cardiovascular;  Laterality: N/A;   NM MYOVIEW LTD     PENILE PROSTHESIS IMPLANT N/A 08/16/2015   Procedure: PENILE PROTHESIS INFLATABLE, three piece, Excisional biopsy of Penile ulcer, Penile molding;  Surgeon: Jethro Bolus, MD;  Location: [facility] ORS;  Service: Urology;  Laterality: N/A;   PENILE PROSTHESIS IMPLANT N/A 12/24/2017   Procedure: REMOVAL AND  REPLACEMENT  COLOPLAST PENILE PROSTHESIS;  Surgeon: Crista Elliot, MD;  Location: [facility] ORS;  Service: Urology;  Laterality: N/A;   QUADRICEPS TENDON REPAIR  07/21/2011   Procedure: REPAIR QUADRICEP TENDON;  Surgeon: Fuller Canada, MD;  Location: [facility] ORS;  Service: Orthopedics;  Laterality: Right;    RIGHT/LEFT HEART CATH AND CORONARY ANGIOGRAPHY N/A 05/30/2022   Procedure: RIGHT/LEFT HEART CATH AND CORONARY ANGIOGRAPHY;  Surgeon: Corky Crafts, MD;  Location: [facility] INVASIVE CV LAB;  Service: Cardiovascular;  Laterality: N/A;   TEE WITHOUT CARDIOVERSION N/A 06/05/2022   Procedure: TRANSESOPHAGEAL ECHOCARDIOGRAM (TEE);  Surgeon: Corliss Skains, MD;  Location: [facility] OR;  Service: Open Heart Surgery;  Laterality: N/A;   TOENAIL EXCISION     removed x2-bilateral   UMBILICAL HERNIA REPAIR  07/17/2005   [place]    Allergies  Allergen Reactions   Opana [Oxymorphone Hcl] Itching   Tramadol Itching    Immunization History  Administered Date(s) Administered   Fluad Quad(high Dose 65+) 04/16/2022   Influenza, High Dose Seasonal PF 09/12/2018, 04/17/2019, 05/11/2020   Influenza-Unspecified 09/12/2018, 04/01/2019, 04/22/2021   Moderna Sars-Covid-2 Vaccination 09/18/2019, 10/16/2019, 05/24/2020   PPD Test 07/19/2018, 09/07/2018, 11/18/2019   Pneumococcal Polysaccharide-23 09/13/2018, 05/03/2021   Td 03/27/2011    Family History  Problem Relation Age of Onset   Hypertension Mother        MI   Cancer Mother        breast    Diabetes Mother    Diabetes Father    Hypertension Father    Hypertension Sister    Diabetes Sister    Arthritis Other    Asthma Other    Lung disease Other    Anesthesia problems Neg Hx    Hypotension Neg Hx    Malignant hyperthermia Neg Hx    Pseudochol deficiency Neg Hx    Colon cancer Neg Hx      Current Facility-Administered Medications:    albuterol (PROVENTIL) (2.5 MG/3ML) 0.083% nebulizer solution 2.5 mg, 2.5 mg, Nebulization, Q2H PRN, Tyrone Nine, MD   aspirin EC tablet 81 mg, 81 mg, Oral, Daily, Hazeline Junker B, MD, 81 mg at 02/17/23 0842   ceFEPIme (MAXIPIME) 1 g in sodium chloride 0.9 % 100 mL IVPB, 1 g, Intravenous, Q24H, Jenita Seashore, RPH, Last Rate: 200 mL/hr at 02/17/23 1346, 1 g at 02/17/23 1346   Chlorhexidine Gluconate Cloth  2 % PADS 6 each, 6 each, Topical, Q0600, Tyler Pita, MD, 6 each at 02/17/23 0843   clopidogrel (PLAVIX) tablet 75 mg, 75 mg, Oral, Daily, Hazeline Junker B, MD, 75 mg at 02/17/23 1610   dialysis solution 2.5% low-MG/low-CA dianeal solution, , Intraperitoneal, Q24H, Vallery Sa C, MD   gentamicin cream (GARAMYCIN) 0.1 % 1 Application, 1 Application, Topical, Daily, Estanislado Emms, MD, 1 Application at 02/17/23 0843   heparin injection 5,000 Units, 5,000 Units, Subcutaneous, Q8H, Hazeline Junker B, MD, 5,000 Units at 02/17/23 0717   heparin sodium (porcine) 3,000 Units in dialysis solution 2.5% low-MG/low-CA 6,000 mL dialysis solution, , Peritoneal Dialysis, PRN, Zeyfang, David, PA-C   insulin aspart (novoLOG) injection 0-9 Units, 0-9 Units, Subcutaneous, Q4H, Tyrone Nine, MD, 1 Units at 02/17/23 1144   insulin glargine-yfgn (SEMGLEE) injection 15 Units, 15 Units, Subcutaneous, Daily, Leroy Sea, MD, 15 Units at 02/17/23 0842   ipratropium-albuterol (DUONEB) 0.5-2.5 (3) MG/3ML  nebulizer solution 3 mL, 3 mL, Nebulization, Q6H, Hazeline Junker B, MD, 3 mL at 02/17/23 1408   midodrine (PROAMATINE) tablet 5 mg, 5 mg, Oral, TID WC, Hazeline Junker B, MD, 5 mg at 02/17/23 1143   mometasone-formoterol (DULERA) 200-5 MCG/ACT inhaler 2 puff, 2 puff, Inhalation, BID, Tyrone Nine, MD, 2 puff at 02/17/23 0828   pantoprazole (PROTONIX) EC tablet 40 mg, 40 mg, Oral, BID, Hazeline Junker B, MD, 40 mg at 02/17/23 9147   sevelamer carbonate (RENVELA) tablet 2,400 mg, 2,400 mg, Oral, TID WC, Vallery Sa C, MD, 2,400 mg at 02/17/23 1143   sodium chloride flush (NS) 0.9 % injection 3 mL, 3 mL, Intravenous, Q12H, Hazeline Junker B, MD, 3 mL at 02/17/23 0844   vancomycin variable dose per unstable renal function (pharmacist dosing), , Does not apply, See admin instructions, Jenita Seashore, [facility]     Significant Hospital Events:  02/14/2023 - admit - 02/16/2023 felt to be responding to antibiotics more awake.  Nursing noticed  him to be ambulatory to the bathroom.  Home Xanax and Neurontin were being held.  For his symptomatic transaminitis he had right upper quadrant ultrasound that suggested cirrhosis.  He did have peritoneal dialysis on 02/16/2023  -02/17/2023: Malfunction of peritoneal dialysis catheter with complaints of abdominal pain.  Has subsequent status post temporary HD catheter right internal jugular vein.  He then underwent emergent dialysis and returned back to the unit at around 10 PM 02/17/2023.  Nursing noticed marked difference.  He was less interactive.  He was alert and having a gaze but not following commands.  He would mumble a word.  His temperature was 101.5.  Tachypneic having tremors oxygen needs now at 5 L [.  Hemodialysis was at 4 L] and saturating 100% status post -1.2 L on hemodialysis.  Chest x-ray done showed worsening pulm infiltrates particularly lower lobes compared to admission.  Critical care medicine consulted.  By hospitalis  02/18/23: early h ours - CCM consult    Interim History / Subjective:   02/18/2023 - x  Objective   Blood pressure 136/69, pulse 79, temperature (!) 101.5 F (38.6 C), temperature source Axillary, resp. rate (!) 33, height 5\' 6"  (1.676 m), weight 99.6 kg, SpO2 92%.        Intake/Output Summary (Last 24 hours) at 02/18/2023 0007 Last data filed at 02/17/2023 2121 Gross per 24 hour  Intake -1524 ml  Output 1200 ml  Net -2724 ml   Filed Weights   02/16/23 0042 02/16/23 2023 02/17/23 0500  Weight: 101.1 kg 97.7 kg 99.6 kg    Examination: General: obese male. Lying still in bed HENT: 5L [place]. Mallampatti class 3.  RIJ HD cath Lungs: Tachypnenic high 20s but not paradoxical. Cardiovascular: normal heart sound Abdomen: soft Extremities: itnact with chronic venous stasis edema Neuro: Awake, good tone in all 4s. No focal deficit . Mumbles to question. Gazes but does not interact GU: not examined  Resolved Hospital Problem list   x  Assessment & Plan:     Acute hypoxemic respiratory failure due to pneumonia on chest x-ray and requiring 2 L at admission 02/14/2023  02/18/2023 -> worsening to 4-5 L gradually worsening chest x-ray and SIRS/sepsis physiology  P:   Check blood gas Continue oxygen for pulse ox greater than 94% [racial pulse ox] Start BiPAP after checking blood gas Intubate if wrose Start NPO  Check RVP for secondary virusss   Anxiety on home Xanax -on hold since admission Chronic pain on  home gabapentin -on hold since admission  Acute encephalopathy with confusion at admission 02/14/2023 and 02/15/2023 and subsequent improvement with treatment of peritonitis.  Recurrent confusion again 02/17/2023 after hemodialysis.  Suggestive of septic encephalopathy based on worsening chest x-ray    P:   Se 4respiratory and sepsis section Continue to hold Xanax and gabapentin    Peritonitis from peritoneal dialysis catheter on admission 02/14/2023 -responded to initial antibiotics Recurrent sepsis physiology post hemodialysis 02/17/2023 -> probably worsening HCAP    P:   Broaden antibiotics - per [facility] Get sepsis biomarkers procalcitonin lactate Check repeat labs Check DIC panel   Zosyn 02/18/23 >> Vanc 02/15/23 >> Cefepime 02/15/23 . 02/18/23 Flagyl 8/12/4 - 02/16/23 Azithro 02/15/23  - 8/1 Ceftriaxone 8/1 - 8/1    End-stage renal disease on peritoneal dialysis in [place] Removal of peritoneal dialysis catheter 02/16/2023 following malfunction Insertion of new hemodialysis catheter 02/17/2023 and status post first dialysis 02/17/2023    P:  Per renal    Transaminiits   Plan  - per [facility]   REST  - per [facility]  Best practice (daily eval):  Diet: npo Pain/Anxiety/Delirium protocol (if indicated): x VAP protocol (if indicated): x DVT prophylaxis: per [facility] GI prophylaxis:  per [facility] Glucose control: per [facility] Mobility: bed rest Code Status: full cod3 Family Communication: per [facility] Disposition: remain on 5W -> lab  follow by [facility] -> but move to ICU of worse     ATTESTATION & SIGNATURE   The patient [name] [name] is critically ill with multiple organ systems failure and requires high complexity decision making for assessment and support, frequent evaluation and titration of therapies, application of advanced monitoring technologies and extensive interpretation of multiple databases.   Critical Care Time devoted to patient care services described in this note is  40  Minutes. This time reflects time of care of this signee Dr Kalman Shan. This critical care time does not reflect procedure time, or teaching time or supervisory time of PA/NP/Med student/Med Resident etc but could involve care discussion time      SIGNATURE    Dr. Kalman Shan, M.D., F.C.C.P,  Pulmonary and Critical Care Medicine Staff Physician, [facility] Center Director - Interstitial Lung Disease  Program  Pulmonary Fibrosis [facility] [place], [place], [address]  NPI Number:  NPI #[id number]  Pager: [phone], If no answer  -> Check AMION or Try [phone] Telephone (clinical office): [phone] Telephone (research): [phone]  12:07 AM 02/18/2023   02/18/2023 12:07 AM    LABS    PULMONARY Recent Labs  Lab 02/15/23 0827  HCO3 27.6  O2SAT 36.8    CBC Recent Labs  Lab 02/15/23 0006 02/16/23 0117 02/17/23 0351  HGB 12.2* 10.6* 10.4*  HCT 39.1 33.3* 32.3*  WBC 9.4 5.1 6.7  PLT 139* 133* 139*    COAGULATION Recent Labs  Lab 02/15/23 0827 02/16/23 0117  INR 1.2 1.3*    CARDIAC  No results for input(s): "TROPONINI" in the last 168 hours. No results for input(s): "PROBNP" in the last 168 hours.  CHEMISTRY Recent Labs  Lab 02/15/23 0006 02/15/23 0827 02/16/23 0117 02/17/23 0351  NA 132* 132* 130* 131*  K 5.0 4.0 4.3 4.3  CL 89* 90* 91* 90*  CO2 23 24 19* 23  GLUCOSE 147* 126* 284* 210*  BUN 43* 49* 57* 69*  CREATININE  12.73* 13.32* 14.71* 15.03*  CALCIUM 8.1* 7.8* 7.2* 7.3*  MG  --   --   --  1.8  PHOS  --   --  8.2*  --    Estimated Creatinine Clearance: 4.9 mL/min (A) (by C-G formula based on SCr of 15.03 mg/dL (H)).   LIVER Recent Labs  Lab 02/15/23 0006 02/15/23 0827 02/16/23 0117 02/17/23 0351  AST 5,049* 4,152* 2,628* 1,252*  ALT 2,456* 2,053* 1,431* 1,018*  ALKPHOS 99 111 99 127*  BILITOT 1.3* 1.0 0.8 0.7  PROT 6.4* 5.9* 5.3* 5.2*  ALBUMIN 2.1* 1.9* <1.5* 1.6*  INR  --  1.2 1.3*  --      INFECTIOUS Recent Labs  Lab 02/15/23 0827 02/16/23 0116 02/17/23 0351  PROCALCITON >150.00 >150.00 >150.00     ENDOCRINE CBG (last 3)  Recent Labs    02/17/23 1948 02/17/23 2232 02/18/23 0001  GLUCAP 116* 120* 130*         IMAGING x48h  - image(s) personally visualized  -   highlighted in bold DG CHEST PORT 1 VIEW  Result Date: 02/17/2023 CLINICAL DATA:  Respiratory distress, shaking. EXAM: PORTABLE CHEST 1 VIEW COMPARISON:  02/16/2023. FINDINGS: The heart size and mediastinal contours are stable. There is atherosclerotic calcification of the aorta. Lung volumes are low and mild airspace disease is noted in the mid to lower lung fields bilaterally. No effusion or pneumothorax. A right internal jugular central venous catheter terminates over the right atrium. Sternotomy wires are present over the midline. IMPRESSION: 1. Mild airspace opacities in the mid to lower lung fields, possible multifocal pneumonia. 2. Right internal jugular central venous catheter terminating in the right atrium. Electronically Signed   By: Thornell Sartorius M.D.   On: 02/17/2023 23:56   IR Fluoro Guide CV Line Right  Result Date: 02/17/2023 INDICATION: Acute renal failure EXAM: Ultrasound fluoroscopy guided temporary right IJ hemodialysis catheter placement MEDICATIONS: None ANESTHESIA/SEDATION: None FLUOROSCOPY: Radiation Exposure Index (as provided by the fluoroscopic device): 5 mGy Kerma COMPLICATIONS: None  immediate. PROCEDURE: Informed written consent was obtained from the patient after a thorough discussion of the procedural risks, benefits and alternatives. All questions were addressed. Maximal Sterile Barrier Technique was utilized including caps, mask, sterile gowns, sterile gloves, sterile drape, hand hygiene and skin antiseptic. A timeout was performed prior to the initiation of the procedure. Right neck prepped and draped in the usual sterile fashion. All elements of maximal sterile barrier were utilized including, cap, mask, sterile gown, sterile gloves, large sterile drape, hand scrubbing and 2% Chlorhexidine for skin cleaning. The right internal jugular vein was evaluated with ultrasound and shown to be patent. A permanent ultrasound image was obtained and placed in the patient's medical record. Using sterile gel and a sterile probe cover, the right internal jugular vein was entered with a 21 ga needle during real time ultrasound guidance. 0.018 inch guidewire placed and 21 ga needle exchanged for transitional dilator set. Utilizing fluoroscopy, 0.035 inch guidewire advanced through the dilator without difficulty. Serial dilation performed, and catheter inserted over the guidewire. The tip was positioned in the right atrium. All lumens of the catheter aspirated and flushed well. The dialysis lumens were locked with Heparin. The catheter was secured to the skin with suture. The insertion site was covered with sterile dressing. IMPRESSION: Temporary right IJ hemodialysis catheter (20 cm) is ready for use. Electronically Signed   By: Acquanetta Belling M.D.   On: 02/17/2023 11:25   IR US Guide Vasc Access Right  Result Date: 02/17/2023 INDICATION: Acute renal failure EXAM: Ultrasound fluoroscopy guided temporary right IJ hemodialysis catheter placement MEDICATIONS: None ANESTHESIA/SEDATION: None  FLUOROSCOPY: Radiation Exposure Index (as provided by the fluoroscopic device): 5 mGy Kerma COMPLICATIONS: None  immediate. PROCEDURE: Informed written consent was obtained from the patient after a thorough discussion of the procedural risks, benefits and alternatives. All questions were addressed. Maximal Sterile Barrier Technique was utilized including caps, mask, sterile gowns, sterile gloves, sterile drape, hand hygiene and skin antiseptic. A timeout was performed prior to the initiation of the procedure. Right neck prepped and draped in the usual sterile fashion. All elements of maximal sterile barrier were utilized including, cap, mask, sterile gown, sterile gloves, large sterile drape, hand scrubbing and 2% Chlorhexidine for skin cleaning. The right internal jugular vein was evaluated with ultrasound and shown to be patent. A permanent ultrasound image was obtained and placed in the patient's medical record. Using sterile gel and a sterile probe cover, the right internal jugular vein was entered with a 21 ga needle during real time ultrasound guidance. 0.018 inch guidewire placed and 21 ga needle exchanged for transitional dilator set. Utilizing fluoroscopy, 0.035 inch guidewire advanced through the dilator without difficulty. Serial dilation performed, and catheter inserted over the guidewire. The tip was positioned in the right atrium. All lumens of the catheter aspirated and flushed well. The dialysis lumens were locked with Heparin. The catheter was secured to the skin with suture. The insertion site was covered with sterile dressing. IMPRESSION: Temporary right IJ hemodialysis catheter (20 cm) is ready for use. Electronically Signed   By: Acquanetta Belling M.D.   On: 02/17/2023 11:25   DG Abd 1 View  Result Date: 02/17/2023 CLINICAL DATA:  End-stage renal disease. EXAM: ABDOMEN - 1 VIEW COMPARISON:  No comparison studies available. FINDINGS: The bowel gas pattern is normal. No radio-opaque calculi or other significant radiographic abnormality are seen. Peritoneal dialysis catheter overlies the lower abdomen.  IMPRESSION: Negative. Electronically Signed   By: Kennith Center M.D.   On: 02/17/2023 10:41   DG Chest Port 1 View  Result Date: 02/16/2023 CLINICAL DATA:  Shortness of breath. COPD and end-stage kidney disease. EXAM: PORTABLE CHEST 1 VIEW COMPARISON:  02/14/23 FINDINGS: Stable cardiac enlargement. Status post median sternotomy and CABG procedure. Blunting of the left costophrenic angle is concerning for small effusion. Bilateral mid and lower lung zone hazy opacities are identified compatible with multifocal pneumonia as noted on CT from prior day. Visualized osseous structures are unremarkable. IMPRESSION: 1. Bilateral mid and lower lung zone hazy opacities compatible with multifocal pneumonia as noted on CT from prior day. 2. Small left pleural effusion. Electronically Signed   By: Signa Kell M.D.   On: 02/16/2023 07:06

## 2023-02-18 NOTE — Progress Notes (Signed)
Subjective: Temp HD catheter placed yesterday due to PD catheter malfunction.  Had HD for UF - tolerated 1.2L.  On returning to floor had increased O2 requirement, AMS, fever Tm 101.5. CXR last PM with mild BL opacities c/w multifocal PNA.    Objective Vital signs in last 24 hours: Vitals:   02/18/23 0616 02/18/23 0736 02/18/23 0801 02/18/23 0830  BP:  133/68 127/77 (!) 142/112  Pulse:  75 74   Resp:  (!) 27 (!) 29 (!) 27  Temp:  (!) 100.9 F (38.3 C)    TempSrc:  Axillary    SpO2: 97%  97%   Weight:      Height:       Weight change:   Physical Exam: General: ill appearing  Heart: RRR no MRG Lungs: no coughing, BL rhonchi, no rales Abdomen: soft, mildly TTP RUQ o/w no rebound or guarding Extremities: No pedal edema Dialysis Access: PD cath present LUA AVF positive thrill but very small and tortuous, temp HD catheter RIJ dressing with pressure dressing Neuro: sleepy, some myoclonic jerks noted, will follow commands and answer simple questions   Outpatient PD orders:  Followed by [facility] (note his PD RN was on vacation  but nephrology called [facility] [place] [facility] Cheree Ditto) for orders    CCPD 5 cycles, 3000 fill vol (note pt states on consult he thinks is 2.5 liters) Total time: 10 hrs Last fill 1500 cc Meds: Mircera 150 mcg q monthly Venofer 100mg  once a month  Problem/Plan:   Peritonitis = cell count 2200, 93% PMN, continue current antibiotics = vancomycin, cefepime until culture back  - cultures currently neg s/p 1d.  Will repeat cell count today to ensure clearing -- if not will need PD catheter removed.  Abd exam is fairly reassuring today ESRD - PD not working 8/2,  Had IR place HD catheter  and underwent UF treatment 8/3 PM.  KUB shows well positioned PD catheter.   Will try PD again tonight - wife says has had low drain volume alarms off/on at home.   Metabolic encephalopathy due to sepsis from peritonitis/multifocal pneumonia /recent COVID  -  mental status initially improved but again worsened in the setting of worsening sepsis. Acute hide toxic respiratory failure multifocal pneumonia - worse s/p UF 8/3 - repeat CXR showing no PNA but multifocal PNA, abx per primary HTN/volume - UF with HD 8.3 - 1.2L, PD overnight 2.5% dialysate, can do HD for UF tomorrow if needed Transaminitis -workup per admitting,persistent, setting of covid, chronic hypotension- note that abdominal imaging is suggestive of possible cirrhosis, as well.  Improving Anemia -of CKD-Hgb 10.6 <12.2 no ESA needed currently monitor Secondary hyperparathyroidism -corrected calcium 10, no vitamin D, phosphorus 8.2, Renvela binder  D/w wife bedside and PCCM MD   Estill Bakes MD [facility] Pager [phone]   Labs: Basic Metabolic Panel: Recent Labs  Lab 02/16/23 0117 02/17/23 0351 02/17/23 2332 02/18/23 0425  NA 130* 131* 133* 132*  K 4.3 4.3 3.3* 3.1*  CL 91* 90* 92* 93*  CO2 19* 23 25 26   GLUCOSE 284* 210* 154* 92  BUN 57* 69* 35* 38*  CREATININE 14.71* 15.03* 8.36* 9.61*  CALCIUM 7.2* 7.3* 7.3* 7.2*  PHOS 8.2*  --   --   --    Liver Function Tests: Recent Labs  Lab 02/17/23 0351 02/17/23 2332 02/18/23 0425  AST 1,252* 635* 520*  ALT 1,018* 686* 594*  ALKPHOS 127* 152* 142*  BILITOT 0.7 1.3* 1.0  PROT 5.2* 5.3* 5.1*  ALBUMIN 1.6* 1.6* <1.5*   Recent Labs  Lab 02/15/23 0006  LIPASE 173*   Recent Labs  Lab 02/15/23 0827  AMMONIA 16   CBC: Recent Labs  Lab 02/15/23 0006 02/16/23 0117 02/17/23 0351 02/17/23 2332 02/18/23 0425  WBC 9.4 5.1 6.7 5.4 5.1  NEUTROABS 9.2* 5.1 5.7  --  4.6  HGB 12.2* 10.6* 10.4* 10.3* 9.8*  HCT 39.1 33.3* 32.3* 30.9* 29.8*  MCV 87.7 86.7 83.9 81.5 83.9  PLT 139* 133* 139* 125* 126*  116*   Cardiac Enzymes: Recent Labs  Lab 02/15/23 1110  CKTOTAL 335   CBG: Recent Labs  Lab 02/17/23 2232 02/18/23 0001 02/18/23 0533 02/18/23 0757 02/18/23 0824  GLUCAP 120* 130* 89 74 71     Studies/Results: DG CHEST PORT 1 VIEW  Result Date: 02/17/2023 CLINICAL DATA:  Respiratory distress, shaking. EXAM: PORTABLE CHEST 1 VIEW COMPARISON:  02/16/2023. FINDINGS: The heart size and mediastinal contours are stable. There is atherosclerotic calcification of the aorta. Lung volumes are low and mild airspace disease is noted in the mid to lower lung fields bilaterally. No effusion or pneumothorax. A right internal jugular central venous catheter terminates over the right atrium. Sternotomy wires are present over the midline. IMPRESSION: 1. Mild airspace opacities in the mid to lower lung fields, possible multifocal pneumonia. 2. Right internal jugular central venous catheter terminating in the right atrium. Electronically Signed   By: Thornell Sartorius M.D.   On: 02/17/2023 23:56   IR Fluoro Guide CV Line Right  Result Date: 02/17/2023 INDICATION: Acute renal failure EXAM: Ultrasound fluoroscopy guided temporary right IJ hemodialysis catheter placement MEDICATIONS: None ANESTHESIA/SEDATION: None FLUOROSCOPY: Radiation Exposure Index (as provided by the fluoroscopic device): 5 mGy Kerma COMPLICATIONS: None immediate. PROCEDURE: Informed written consent was obtained from the patient after a thorough discussion of the procedural risks, benefits and alternatives. All questions were addressed. Maximal Sterile Barrier Technique was utilized including caps, mask, sterile gowns, sterile gloves, sterile drape, hand hygiene and skin antiseptic. A timeout was performed prior to the initiation of the procedure. Right neck prepped and draped in the usual sterile fashion. All elements of maximal sterile barrier were utilized including, cap, mask, sterile gown, sterile gloves, large sterile drape, hand scrubbing and 2% Chlorhexidine for skin cleaning. The right internal jugular vein was evaluated with ultrasound and shown to be patent. A permanent ultrasound image was obtained and placed in the patient's medical  record. Using sterile gel and a sterile probe cover, the right internal jugular vein was entered with a 21 ga needle during real time ultrasound guidance. 0.018 inch guidewire placed and 21 ga needle exchanged for transitional dilator set. Utilizing fluoroscopy, 0.035 inch guidewire advanced through the dilator without difficulty. Serial dilation performed, and catheter inserted over the guidewire. The tip was positioned in the right atrium. All lumens of the catheter aspirated and flushed well. The dialysis lumens were locked with Heparin. The catheter was secured to the skin with suture. The insertion site was covered with sterile dressing. IMPRESSION: Temporary right IJ hemodialysis catheter (20 cm) is ready for use. Electronically Signed   By: Acquanetta Belling M.D.   On: 02/17/2023 11:25   IR US Guide Vasc Access Right  Result Date: 02/17/2023 INDICATION: Acute renal failure EXAM: Ultrasound fluoroscopy guided temporary right IJ hemodialysis catheter placement MEDICATIONS: None ANESTHESIA/SEDATION: None FLUOROSCOPY: Radiation Exposure Index (as provided by the fluoroscopic device): 5 mGy Kerma COMPLICATIONS: None immediate. PROCEDURE: Informed written consent was obtained from  the patient after a thorough discussion of the procedural risks, benefits and alternatives. All questions were addressed. Maximal Sterile Barrier Technique was utilized including caps, mask, sterile gowns, sterile gloves, sterile drape, hand hygiene and skin antiseptic. A timeout was performed prior to the initiation of the procedure. Right neck prepped and draped in the usual sterile fashion. All elements of maximal sterile barrier were utilized including, cap, mask, sterile gown, sterile gloves, large sterile drape, hand scrubbing and 2% Chlorhexidine for skin cleaning. The right internal jugular vein was evaluated with ultrasound and shown to be patent. A permanent ultrasound image was obtained and placed in the patient's medical record.  Using sterile gel and a sterile probe cover, the right internal jugular vein was entered with a 21 ga needle during real time ultrasound guidance. 0.018 inch guidewire placed and 21 ga needle exchanged for transitional dilator set. Utilizing fluoroscopy, 0.035 inch guidewire advanced through the dilator without difficulty. Serial dilation performed, and catheter inserted over the guidewire. The tip was positioned in the right atrium. All lumens of the catheter aspirated and flushed well. The dialysis lumens were locked with Heparin. The catheter was secured to the skin with suture. The insertion site was covered with sterile dressing. IMPRESSION: Temporary right IJ hemodialysis catheter (20 cm) is ready for use. Electronically Signed   By: Acquanetta Belling M.D.   On: 02/17/2023 11:25   DG Abd 1 View  Result Date: 02/17/2023 CLINICAL DATA:  End-stage renal disease. EXAM: ABDOMEN - 1 VIEW COMPARISON:  No comparison studies available. FINDINGS: The bowel gas pattern is normal. No radio-opaque calculi or other significant radiographic abnormality are seen. Peritoneal dialysis catheter overlies the lower abdomen. IMPRESSION: Negative. Electronically Signed   By: Kennith Center M.D.   On: 02/17/2023 10:41   Medications:  dialysis solution 2.5% low-MG/low-CA     piperacillin-tazobactam (ZOSYN)  IV      aspirin EC  81 mg Oral Daily   Chlorhexidine Gluconate Cloth  6 each Topical Q0600   clopidogrel  75 mg Oral Daily   gentamicin cream  1 Application Topical Daily   heparin  5,000 Units Subcutaneous Q8H   insulin aspart  0-9 Units Subcutaneous Q4H   insulin glargine-yfgn  15 Units Subcutaneous Daily   ipratropium-albuterol  3 mL Nebulization Q6H   midodrine  5 mg Oral TID WC   mometasone-formoterol  2 puff Inhalation BID   pantoprazole  40 mg Oral BID   potassium chloride  20 mEq Oral Once   sevelamer carbonate  2,400 mg Oral TID WC   sodium chloride flush  3 mL Intravenous Q12H   vancomycin variable dose  per unstable renal function (pharmacist dosing)   Does not apply See admin instructions

## 2023-02-18 NOTE — Progress Notes (Addendum)
PROGRESS NOTE                                                                                                                                                                                                             Patient Demographics:    [name] [name], is a 72 y.o. male, DOB - [birth year], ZOX:[id number]  Outpatient Primary MD for the patient is Benetta Spar, MD    LOS - 3  Admit date - 02/14/2023    Chief Complaint  Patient presents with   Cough       Brief Narrative (HPI from H&P)   72 y.o. male with a history of CABG Nov 2023, COPD, IDT2DM, GERD, and ESRD on PD with hypotension on midodrine who was brought to the ED today by wife for confusion and progressive shortness of breath over the past several days. He had been diagnosed with covid-19 10 days PTA but was only treated with augmentin.  He presented to [facility] with lethargy, initially was thought to have pneumonia further workup suggests he has not SBP from his PD catheter.  Transferred to [name] for further care.   Subjective:   Patient in bed visibly confused this morning, denies any headache or chest pain, does have some abdominal pain.   Assessment  & Plan :   Acute metabolic encephalopathy due to sepsis from peritonitis: Due to peritonitis in a patient with ESRD on peritoneal dialysis with catheter present on admission.  Initially responded well to antibiotics, case was discussed in detail with nephrology in terms of PD catheter management, they will manage the PD catheter, overnight on 02/18/2023 frankly septic, metabolic encephalopathy much worse, antibiotics have been broadened, cultures still pending, transferring to ICU as he appears toxic Case discussed with ICU team in the morning,, procalcitonin continues to be elevated, have informed nephrology again that PD catheter might need to be removed.   ESRD:- Nephrology consulted and on  board.  Case discussed with Dr. Glenna Fellows 02/16/2023 and again on 02/17/2023, looks like his PD catheter is now malfunctioning, IR consulted for temporary HD catheter placement on 02/17/2023, will require HD thereafter, nephrology aware.  Asymptomatic transaminitis with stable bilirubin and alk phos: No biliary dilatation on CT. Lipase 173, no pancreatic inflammation on CT or abdominal tenderness.,  Right upper quadrant ultrasound suggest cirrhosis.  Acute  hepatitis panel is negative, statin on hold.  He is asymptomatic we will continue to monitor, could be due to peritonitis but will continue to monitor.   Acute hypoxic respiratory failure, multifocal pneumonia: Resolved.  CAD s/p CABG nov 2023 with demand myocardial ischemia. -  represents demand ischemia due to respiratory distress in ESRD patient. ECG uninterpretable with chronic LBBB.  Trend is stable and in non-ACS pattern, chest pain-free, already on DAPT, will commence statin once LFTs have stabilized.  Outpatient follow-up with cardiology postdischarge   Chronic Thrombocytopenia: Had low platelets in March as well. Given relatively recent CABG, will continue DAPT and monitor CBC.  Outpatient monitoring by PCP.   Hypokalemia.  Replaced.    Morbid obesity: Body mass index is 35.51 kg/m.  Follow-up with PCP for weight loss.   IDT2DM: On SSI add Semglee.  Lab Results  Component Value Date   HGBA1C 5.9 (H) 05/30/2022   CBG (last 3)  Recent Labs    02/17/23 2232 02/18/23 0001 02/18/23 0533  GLUCAP 120* 130* 89        Condition - Extremely Guarded  Family Communication  :  None  Code Status :  Full  Consults  :  Renal, IR, PCCM  PUD Prophylaxis : PPI   Procedures  :     CT head.  Nonacute.    Right upper quad ultrasound.  Possible cirrhosis.    CT abdomen pelvis. Multiple patchy airspace opacities are seen in visualized lung bases concerning for multifocal pneumonia. Bilateral renal atrophy is noted consistent with history of  end-stage renal disease. Grossly stable high density material is noted within both nondilated intrarenal collecting systems as well as proximal ureters, concerning for either calcifications or residual contrast material as described on prior exam. Probable hepatic cirrhosis. Peritoneal dialysis catheter tip is noted in left lower quadrant. Mild amount of free fluid is noted in the abdomen and pelvis, but most prominently seen in right upper quadrant. Stable mild prostatic enlargement. Aortic Atherosclerosis       Disposition Plan  :    Status is: Inpatient   DVT Prophylaxis  :    heparin injection 5,000 Units Start: 02/15/23 1745  Lab Results  Component Value Date   PLT 116 (L) 02/18/2023   PLT 126 (L) 02/18/2023    Diet :  Diet Order             Diet NPO time specified  Diet effective ____                    Inpatient Medications  Scheduled Meds:  aspirin EC  81 mg Oral Daily   Chlorhexidine Gluconate Cloth  6 each Topical Q0600   clopidogrel  75 mg Oral Daily   gentamicin cream  1 Application Topical Daily   heparin  5,000 Units Subcutaneous Q8H   insulin aspart  0-9 Units Subcutaneous Q4H   insulin glargine-yfgn  15 Units Subcutaneous Daily   ipratropium-albuterol  3 mL Nebulization Q6H   midodrine  5 mg Oral TID WC   mometasone-formoterol  2 puff Inhalation BID   pantoprazole  40 mg Oral BID   potassium chloride  20 mEq Oral Once   sevelamer carbonate  2,400 mg Oral TID WC   sodium chloride flush  3 mL Intravenous Q12H   vancomycin variable dose per unstable renal function (pharmacist dosing)   Does not apply See admin instructions   Continuous Infusions:  dialysis solution 2.5% low-MG/low-CA  piperacillin-tazobactam (ZOSYN)  IV     PRN Meds:.albuterol, heparin sodium (porcine) 3,000 Units in dialysis solution 2.5% low-MG/low-CA 6,000 mL dialysis solution    Objective:   Vitals:   02/18/23 0533 02/18/23 0616 02/18/23 0736 02/18/23 0801  BP: 125/81   133/68 127/77  Pulse: 77  75 74  Resp: (!) 28  (!) 27 (!) 29  Temp: (!) 100.4 F (38 C)  (!) 100.9 F (38.3 C)   TempSrc: Axillary  Axillary   SpO2: 96% 97%  97%  Weight:      Height:        Wt Readings from Last 3 Encounters:  02/17/23 99.6 kg  01/09/23 100.4 kg  12/28/22 103.4 kg     Intake/Output Summary (Last 24 hours) at 02/18/2023 0802 Last data filed at 02/17/2023 2121 Gross per 24 hour  Intake -1524 ml  Output 1200 ml  Net -2724 ml     Physical Exam  Awake but now confused, No new F.N deficits, Normal affect [place].AT,PERRAL Supple Neck, No JVD,   Symmetrical Chest wall movement, Good air movement bilaterally, bilateral wheezing RRR,No Gallops,Rubs or new Murmurs,  +ve B.Sounds, Abd Soft, Mild tenderness,   No Cyanosis, Clubbing or edema       Data Review:    Recent Labs  Lab 02/15/23 0006 02/16/23 0117 02/17/23 0351 02/17/23 2332 02/18/23 0425  WBC 9.4 5.1 6.7 5.4 5.1  HGB 12.2* 10.6* 10.4* 10.3* 9.8*  HCT 39.1 33.3* 32.3* 30.9* 29.8*  PLT 139* 133* 139* 125* 126*  116*  MCV 87.7 86.7 83.9 81.5 83.9  MCH 27.4 27.6 27.0 27.2 27.6  MCHC 31.2 31.8 32.2 33.3 32.9  RDW 16.0* 16.1* 16.1* 15.9* 15.8*  LYMPHSABS 0.2* 0.0* 0.2*  --  0.1*  MONOABS 0.0* 0.0* 0.2  --  0.2  EOSABS 0.0 0.0 0.0  --  0.0  BASOSABS 0.0 0.0 0.0  --  0.0    Recent Labs  Lab 02/15/23 0006 02/15/23 0827 02/16/23 0116 02/16/23 0117 02/17/23 0351 02/17/23 2332 02/18/23 0425  NA 132* 132*  --  130* 131* 133* 132*  K 5.0 4.0  --  4.3 4.3 3.3* 3.1*  CL 89* 90*  --  91* 90* 92* 93*  CO2 23 24  --  19* 23 25 26   ANIONGAP 20* 18*  --  20* 18* 16* 13  GLUCOSE 147* 126*  --  284* 210* 154* 92  BUN 43* 49*  --  57* 69* 35* 38*  CREATININE 12.73* 13.32*  --  14.71* 15.03* 8.36* 9.61*  AST 5,049* 4,152*  --  2,628* 1,252* 635* 520*  ALT 2,456* 2,053*  --  1,431* 1,018* 686* 594*  ALKPHOS 99 111  --  99 127* 152* 142*  BILITOT 1.3* 1.0  --  0.8 0.7 1.3* 1.0  ALBUMIN 2.1* 1.9*  --   <1.5* 1.6* 1.6* <1.5*  CRP  --  19.4*  --  18.8* 10.6* 10.5* 12.3*  DDIMER  --   --   --   --   --  10.53* 10.99*  PROCALCITON  --  >150.00 >150.00  --  >150.00 130.47 132.63  LATICACIDVEN  --   --   --   --   --  1.6  --   INR  --  1.2  --  1.3*  --  1.2 1.2  AMMONIA  --  16  --   --   --   --   --   BNP 1,479.0*  --   --  826.8* 1,278.7*  --  1,871.8*  MG  --   --   --   --  1.8  --  1.7  CALCIUM 8.1* 7.8*  --  7.2* 7.3* 7.3* 7.2*      Recent Labs  Lab 02/15/23 0006 02/15/23 0827 02/16/23 0116 02/16/23 0117 02/17/23 0351 02/17/23 2332 02/18/23 0425  CRP  --  19.4*  --  18.8* 10.6* 10.5* 12.3*  DDIMER  --   --   --   --   --  10.53* 10.99*  PROCALCITON  --  >150.00 >150.00  --  >150.00 130.47 132.63  LATICACIDVEN  --   --   --   --   --  1.6  --   INR  --  1.2  --  1.3*  --  1.2 1.2  AMMONIA  --  16  --   --   --   --   --   BNP 1,479.0*  --   --  826.8* 1,278.7*  --  1,871.8*  MG  --   --   --   --  1.8  --  1.7  CALCIUM 8.1* 7.8*  --  7.2* 7.3* 7.3* 7.2*     Micro Results Recent Results (from the past 240 hour(s))  Culture, blood (Routine X 2) w Reflex to ID Panel     Status: None (Preliminary result)   Collection Time: 02/15/23 12:09 PM   Specimen: BLOOD  Result Value Ref Range Status   Specimen Description BLOOD BLOOD RIGHT HAND  Final   Special Requests   Final    BOTTLES DRAWN AEROBIC AND ANAEROBIC Blood Culture adequate volume   Culture   Final    NO GROWTH 2 DAYS Performed at [facility], [address] [place], [place] [address]    Report Status PENDING  Incomplete  Culture, blood (Routine X 2) w Reflex to ID Panel     Status: None (Preliminary result)   Collection Time: 02/15/23 12:09 PM   Specimen: BLOOD  Result Value Ref Range Status   Specimen Description BLOOD BLOOD RIGHT WRIST  Final   Special Requests   Final    BOTTLES DRAWN AEROBIC AND ANAEROBIC Blood Culture adequate volume   Culture   Final    NO GROWTH 2 DAYS Performed at [facility], [address], [place], [place] [address]    Report Status PENDING  Incomplete  MRSA Next Gen by PCR, Nasal     Status: None   Collection Time: 02/15/23  5:25 PM   Specimen: Nasal Mucosa; Nasal Swab  Result Value Ref Range Status   MRSA by PCR Next Gen NOT DETECTED NOT DETECTED Final    Comment: (NOTE) The GeneXpert MRSA Assay (FDA approved for NASAL specimens only), is one component of a comprehensive MRSA colonization surveillance program. It is not intended to diagnose MRSA infection nor to guide or monitor treatment for MRSA infections. Test performance is not FDA approved in patients less than 24 years old. Performed at [facility], [address], [place], [place] [address]   Body fluid culture w Gram Stain     Status: None (Preliminary result)   Collection Time: 02/15/23 11:30 PM   Specimen: Peritoneal Dialysate; Body Fluid  Result Value Ref Range Status   Specimen Description PERITONEAL DIALYSATE  Final   Special Requests NONE  Final   Gram Stain   Final    FEW WBC PRESENT,BOTH PMN AND MONONUCLEAR NO ORGANISMS SEEN  Culture   Final    NO GROWTH 1 DAY Performed at [facility], [address] [place], [place] [address]    Report Status PENDING  Incomplete    Radiology Reports DG CHEST PORT 1 VIEW  Result Date: 02/17/2023 CLINICAL DATA:  Respiratory distress, shaking. EXAM: PORTABLE CHEST 1 VIEW COMPARISON:  02/16/2023. FINDINGS: The heart size and mediastinal contours are stable. There is atherosclerotic calcification of the aorta. Lung volumes are low and mild airspace disease is noted in the mid to lower lung fields bilaterally. No effusion or pneumothorax. A right internal jugular central venous catheter terminates over the right atrium. Sternotomy wires are present over the midline. IMPRESSION: 1. Mild airspace opacities in the mid to lower lung fields, possible multifocal pneumonia. 2. Right internal jugular central venous catheter terminating in the  right atrium. Electronically Signed   By: Thornell Sartorius M.D.   On: 02/17/2023 23:56   IR Fluoro Guide CV Line Right  Result Date: 02/17/2023 INDICATION: Acute renal failure EXAM: Ultrasound fluoroscopy guided temporary right IJ hemodialysis catheter placement MEDICATIONS: None ANESTHESIA/SEDATION: None FLUOROSCOPY: Radiation Exposure Index (as provided by the fluoroscopic device): 5 mGy Kerma COMPLICATIONS: None immediate. PROCEDURE: Informed written consent was obtained from the patient after a thorough discussion of the procedural risks, benefits and alternatives. All questions were addressed. Maximal Sterile Barrier Technique was utilized including caps, mask, sterile gowns, sterile gloves, sterile drape, hand hygiene and skin antiseptic. A timeout was performed prior to the initiation of the procedure. Right neck prepped and draped in the usual sterile fashion. All elements of maximal sterile barrier were utilized including, cap, mask, sterile gown, sterile gloves, large sterile drape, hand scrubbing and 2% Chlorhexidine for skin cleaning. The right internal jugular vein was evaluated with ultrasound and shown to be patent. A permanent ultrasound image was obtained and placed in the patient's medical record. Using sterile gel and a sterile probe cover, the right internal jugular vein was entered with a 21 ga needle during real time ultrasound guidance. 0.018 inch guidewire placed and 21 ga needle exchanged for transitional dilator set. Utilizing fluoroscopy, 0.035 inch guidewire advanced through the dilator without difficulty. Serial dilation performed, and catheter inserted over the guidewire. The tip was positioned in the right atrium. All lumens of the catheter aspirated and flushed well. The dialysis lumens were locked with Heparin. The catheter was secured to the skin with suture. The insertion site was covered with sterile dressing. IMPRESSION: Temporary right IJ hemodialysis catheter (20 cm) is ready  for use. Electronically Signed   By: Acquanetta Belling M.D.   On: 02/17/2023 11:25   IR US Guide Vasc Access Right  Result Date: 02/17/2023 INDICATION: Acute renal failure EXAM: Ultrasound fluoroscopy guided temporary right IJ hemodialysis catheter placement MEDICATIONS: None ANESTHESIA/SEDATION: None FLUOROSCOPY: Radiation Exposure Index (as provided by the fluoroscopic device): 5 mGy Kerma COMPLICATIONS: None immediate. PROCEDURE: Informed written consent was obtained from the patient after a thorough discussion of the procedural risks, benefits and alternatives. All questions were addressed. Maximal Sterile Barrier Technique was utilized including caps, mask, sterile gowns, sterile gloves, sterile drape, hand hygiene and skin antiseptic. A timeout was performed prior to the initiation of the procedure. Right neck prepped and draped in the usual sterile fashion. All elements of maximal sterile barrier were utilized including, cap, mask, sterile gown, sterile gloves, large sterile drape, hand scrubbing and 2% Chlorhexidine for skin cleaning. The right internal jugular vein was evaluated with ultrasound and shown to be patent.  A permanent ultrasound image was obtained and placed in the patient's medical record. Using sterile gel and a sterile probe cover, the right internal jugular vein was entered with a 21 ga needle during real time ultrasound guidance. 0.018 inch guidewire placed and 21 ga needle exchanged for transitional dilator set. Utilizing fluoroscopy, 0.035 inch guidewire advanced through the dilator without difficulty. Serial dilation performed, and catheter inserted over the guidewire. The tip was positioned in the right atrium. All lumens of the catheter aspirated and flushed well. The dialysis lumens were locked with Heparin. The catheter was secured to the skin with suture. The insertion site was covered with sterile dressing. IMPRESSION: Temporary right IJ hemodialysis catheter (20 cm) is ready for  use. Electronically Signed   By: Acquanetta Belling M.D.   On: 02/17/2023 11:25   DG Abd 1 View  Result Date: 02/17/2023 CLINICAL DATA:  End-stage renal disease. EXAM: ABDOMEN - 1 VIEW COMPARISON:  No comparison studies available. FINDINGS: The bowel gas pattern is normal. No radio-opaque calculi or other significant radiographic abnormality are seen. Peritoneal dialysis catheter overlies the lower abdomen. IMPRESSION: Negative. Electronically Signed   By: Kennith Center M.D.   On: 02/17/2023 10:41   DG Chest Port 1 View  Result Date: 02/16/2023 CLINICAL DATA:  Shortness of breath. COPD and end-stage kidney disease. EXAM: PORTABLE CHEST 1 VIEW COMPARISON:  02/14/23 FINDINGS: Stable cardiac enlargement. Status post median sternotomy and CABG procedure. Blunting of the left costophrenic angle is concerning for small effusion. Bilateral mid and lower lung zone hazy opacities are identified compatible with multifocal pneumonia as noted on CT from prior day. Visualized osseous structures are unremarkable. IMPRESSION: 1. Bilateral mid and lower lung zone hazy opacities compatible with multifocal pneumonia as noted on CT from prior day. 2. Small left pleural effusion. Electronically Signed   By: Signa Kell M.D.   On: 02/16/2023 07:06   US Abdomen Limited RUQ (LIVER/GB)  Result Date: 02/15/2023 CLINICAL DATA:  Transaminitis. EXAM: ULTRASOUND ABDOMEN LIMITED RIGHT UPPER QUADRANT COMPARISON:  CT scan of same day. FINDINGS: Gallbladder: No gallstones or wall thickening visualized. No sonographic Murphy sign noted by sonographer. Possible 3 mm polyp. Common bile duct: Diameter: 3 mm which is within normal limits. Liver: No focal lesion identified. Heterogeneous echotexture of hepatic parenchyma is noted with nodular contours consistent with hepatic cirrhosis. Portal vein is patent on color Doppler imaging with normal direction of blood flow towards the liver. Other: Mild ascites is noted around the liver. IMPRESSION:  Hepatic cirrhosis with mild surrounding ascites. Possible 3 mm gallbladder polyp. Electronically Signed   By: Lupita Raider M.D.   On: 02/15/2023 10:31   CT ABDOMEN PELVIS WO CONTRAST  Result Date: 02/15/2023 CLINICAL DATA:  Epigastric abdominal pain. EXAM: CT ABDOMEN AND PELVIS WITHOUT CONTRAST TECHNIQUE: Multidetector CT imaging of the abdomen and pelvis was performed following the standard protocol without IV contrast. RADIATION DOSE REDUCTION: This exam was performed according to the departmental dose-optimization program which includes automated exposure control, adjustment of the mA and/or kV according to patient size and/or use of iterative reconstruction technique. COMPARISON:  September 27, 2022. FINDINGS: Lower chest: Multiple patchy opacities are noted in both lower lobes concerning for multifocal pneumonia. Hepatobiliary: No cholelithiasis or biliary dilatation is noted. Stable probable hepatic cysts. Slightly nodular hepatic contour is noted suggesting possible hepatic cirrhosis. Pancreas: Unremarkable. No pancreatic ductal dilatation or surrounding inflammatory changes. Spleen: Normal in size without focal abnormality. Adrenals/Urinary Tract: Adrenal glands appear normal. Bilateral renal atrophy  is noted consistent with history of end-stage renal disease. Grossly stable high density material is noted within both intrarenal collecting systems as well as in the proximal ureters. It is uncertain if these represent calcifications or residual contrast material. Stable complex cystic lesion seen involving lower pole of left kidney for which no further follow-up is required. Urinary bladder is decompressed. Stomach/Bowel: There is no evidence of bowel obstruction or inflammation. Stomach is unremarkable. The appendix is not clearly visualized. Vascular/Lymphatic: Aortic atherosclerosis. No enlarged abdominal or pelvic lymph nodes. Reproductive: Stable mild prostatic enlargement. Penile reservoir is noted in  right lower quadrant. Other: Peritoneal dialysis catheter is noted with tip in left lower quadrant. Mild amount of free fluid is noted in the abdomen and pelvis, but most prominently seen in right upper quadrant. Musculoskeletal: No acute or significant osseous findings. IMPRESSION: Multiple patchy airspace opacities are seen in visualized lung bases concerning for multifocal pneumonia. Bilateral renal atrophy is noted consistent with history of end-stage renal disease. Grossly stable high density material is noted within both nondilated intrarenal collecting systems as well as proximal ureters, concerning for either calcifications or residual contrast material as described on prior exam. Probable hepatic cirrhosis. Peritoneal dialysis catheter tip is noted in left lower quadrant. Mild amount of free fluid is noted in the abdomen and pelvis, but most prominently seen in right upper quadrant. Stable mild prostatic enlargement. Aortic Atherosclerosis (ICD10-I70.0). Electronically Signed   By: Lupita Raider M.D.   On: 02/15/2023 09:04   CT HEAD WO CONTRAST ( )  Result Date: 02/15/2023 CLINICAL DATA:  Mental status change with unknown cause.  COVID EXAM: CT HEAD WITHOUT CONTRAST TECHNIQUE: Contiguous axial images were obtained from the base of the skull through the vertex without intravenous contrast. RADIATION DOSE REDUCTION: This exam was performed according to the departmental dose-optimization program which includes automated exposure control, adjustment of the mA and/or kV according to patient size and/or use of iterative reconstruction technique. COMPARISON:  None Available. FINDINGS: Brain: No evidence of acute infarction, hemorrhage, hydrocephalus, extra-axial collection or mass lesion/mass effect. Generalized atrophy. Mild chronic small vessel ischemia in the cerebral white matter. Vascular: No hyperdense vessel or unexpected calcification. Skull: Normal. Negative for fracture or focal lesion.  Sinuses/Orbits: Remote blowout fracture at the right orbit. IMPRESSION: 1. No acute or reversible finding. 2. Moderate atrophy. Electronically Signed   By: Tiburcio Pea M.D.   On: 02/15/2023 07:14   DG Chest 2 View  Result Date: 02/14/2023 CLINICAL DATA:  Cough EXAM: CHEST - 2 VIEW COMPARISON:  Chest x-ray 09/16/2022 FINDINGS: Sternotomy wires are again seen. The heart size and mediastinal contours are within normal limits. Both lungs are clear. The visualized skeletal structures are unremarkable. IMPRESSION: No active cardiopulmonary disease. Electronically Signed   By: Darliss Cheney M.D.   On: 02/14/2023 23:30      Signature  -   Susa Raring M.D on 02/18/2023 at 8:02 AM   -  To page go to [email or web address]

## 2023-02-19 ENCOUNTER — Inpatient Hospital Stay (HOSPITAL_COMMUNITY): Payer: Medicare Other

## 2023-02-19 ENCOUNTER — Inpatient Hospital Stay (HOSPITAL_COMMUNITY): Payer: Medicare Other | Admitting: Anesthesiology

## 2023-02-19 ENCOUNTER — Encounter (HOSPITAL_COMMUNITY)
Admission: EM | Disposition: A | Discharge status: Nursing Home (Includes ICF) | Payer: Self-pay | Source: Home / Self Care | Attending: Internal Medicine

## 2023-02-19 ENCOUNTER — Other Ambulatory Visit: Payer: Self-pay

## 2023-02-19 ENCOUNTER — Encounter (HOSPITAL_COMMUNITY): Payer: Medicare Other

## 2023-02-19 DIAGNOSIS — A419 Sepsis, unspecified organism: Secondary | ICD-10-CM

## 2023-02-19 DIAGNOSIS — K659 Peritonitis, unspecified: Secondary | ICD-10-CM | POA: Diagnosis not present

## 2023-02-19 DIAGNOSIS — B49 Unspecified mycosis: Secondary | ICD-10-CM | POA: Diagnosis not present

## 2023-02-19 DIAGNOSIS — G9341 Metabolic encephalopathy: Secondary | ICD-10-CM | POA: Diagnosis not present

## 2023-02-19 DIAGNOSIS — I38 Endocarditis, valve unspecified: Secondary | ICD-10-CM | POA: Diagnosis not present

## 2023-02-19 DIAGNOSIS — T82898A Other specified complication of vascular prosthetic devices, implants and grafts, initial encounter: Secondary | ICD-10-CM

## 2023-02-19 DIAGNOSIS — R6520 Severe sepsis without septic shock: Secondary | ICD-10-CM

## 2023-02-19 DIAGNOSIS — G9340 Encephalopathy, unspecified: Secondary | ICD-10-CM | POA: Diagnosis not present

## 2023-02-19 HISTORY — PX: PORT-A-CATH REMOVAL: SHX5289

## 2023-02-19 LAB — ECHOCARDIOGRAM COMPLETE
AR max vel: 1.36 cm2
AV Area VTI: 1.57 cm2
AV Area mean vel: 1.26 cm2
AV Mean grad: 15 mmHg
AV Peak grad: 27.2 mmHg
Ao pk vel: 2.61 m/s
Area-P 1/2: 5.84 cm2
Calc EF: 45.3 %
Height: 66 in
S' Lateral: 3.8 cm
Single Plane A2C EF: 50.7 %
Single Plane A4C EF: 50.2 %
Weight: 3516.78 oz

## 2023-02-19 LAB — GLUCOSE, CAPILLARY
Glucose-Capillary: 96 mg/dL (ref 70–99)
Glucose-Capillary: 111 mg/dL — ABNORMAL HIGH (ref 70–99)
Glucose-Capillary: 121 mg/dL — ABNORMAL HIGH (ref 70–99)
Glucose-Capillary: 85 mg/dL (ref 70–99)
Glucose-Capillary: 97 mg/dL (ref 70–99)
Glucose-Capillary: 109 mg/dL — ABNORMAL HIGH (ref 70–99)

## 2023-02-19 LAB — AEROBIC/ANAEROBIC CULTURE W GRAM STAIN (SURGICAL/DEEP WOUND)

## 2023-02-19 LAB — HEMOGLOBIN A1C
Hgb A1c MFr Bld: 7.1 % — ABNORMAL HIGH (ref 4.8–5.6)
Mean Plasma Glucose: 157.07 mg/dL

## 2023-02-19 SURGERY — REMOVAL PORT-A-CATH
Anesthesia: General | Site: Abdomen

## 2023-02-19 MED ORDER — PERFLUTREN LIPID MICROSPHERE
1.0000 mL | INTRAVENOUS | Status: AC | PRN
  Administered 2023-02-19: 5 mL via INTRAVENOUS
  Filled 2023-02-19: qty 10

## 2023-02-19 MED ORDER — PROPOFOL 10 MG/ML IV BOLUS
INTRAVENOUS | Status: DC | PRN
Start: 2023-02-19 — End: 2023-02-19
  Administered 2023-02-19: 80 mg via INTRAVENOUS

## 2023-02-19 MED ORDER — VASOPRESSIN 20 UNIT/ML IV SOLN
INTRAVENOUS | Status: AC
  Filled 2023-02-19: qty 1

## 2023-02-19 MED ORDER — LIDOCAINE HCL (PF) 1 % IJ SOLN
INTRAMUSCULAR | Status: AC
  Filled 2023-02-19: qty 30

## 2023-02-19 MED ORDER — ONDANSETRON HCL 4 MG/2ML IJ SOLN: 4.0000 mg | Freq: Once | INTRAMUSCULAR | Status: DC | PRN

## 2023-02-19 MED ORDER — SUGAMMADEX SODIUM 200 MG/2ML IV SOLN
INTRAVENOUS | Status: DC | PRN
  Administered 2023-02-19: 200 mg via INTRAVENOUS

## 2023-02-19 MED ORDER — FENTANYL CITRATE (PF) 250 MCG/5ML IJ SOLN
INTRAMUSCULAR | Status: DC | PRN
  Administered 2023-02-19: 50 ug via INTRAVENOUS

## 2023-02-19 MED ORDER — PHENYLEPHRINE HCL-NACL 20-0.9 MG/250ML-% IV SOLN
INTRAVENOUS | Status: DC | PRN
  Administered 2023-02-19: 50 ug/min via INTRAVENOUS

## 2023-02-19 MED ORDER — PHENYLEPHRINE HCL-NACL 20-0.9 MG/250ML-% IV SOLN: INTRAVENOUS | Status: DC | PRN

## 2023-02-19 MED ORDER — LIDOCAINE 2% (20 MG/ML) 5 ML SYRINGE
INTRAMUSCULAR | Status: DC | PRN
  Administered 2023-02-19: 100 mg via INTRAVENOUS

## 2023-02-19 MED ORDER — PHENYLEPHRINE 80 MCG/ML (10ML) SYRINGE FOR IV PUSH (FOR BLOOD PRESSURE SUPPORT)
PREFILLED_SYRINGE | INTRAVENOUS | Status: DC | PRN
  Administered 2023-02-19 (×2): 80 ug via INTRAVENOUS
  Administered 2023-02-19: 160 ug via INTRAVENOUS

## 2023-02-19 MED ORDER — FENTANYL CITRATE (PF) 250 MCG/5ML IJ SOLN
INTRAMUSCULAR | Status: AC
  Filled 2023-02-19: qty 5

## 2023-02-19 MED ORDER — FENTANYL CITRATE (PF) 100 MCG/2ML IJ SOLN: 25.0000 ug | INTRAMUSCULAR | Status: DC | PRN

## 2023-02-19 MED ORDER — LIDOCAINE-EPINEPHRINE (PF) 1 %-1:200000 IJ SOLN
INTRAMUSCULAR | Status: DC | PRN
  Administered 2023-02-19: 15 mL

## 2023-02-19 MED ORDER — 0.9 % SODIUM CHLORIDE (POUR BTL) OPTIME
TOPICAL | Status: DC | PRN
  Administered 2023-02-19: 1000 mL

## 2023-02-19 MED ORDER — CHLORHEXIDINE GLUCONATE CLOTH 2 % EX PADS: 6.0000 | MEDICATED_PAD | Freq: Every day | CUTANEOUS | Status: DC

## 2023-02-19 MED ORDER — LIDOCAINE-EPINEPHRINE (PF) 1 %-1:200000 IJ SOLN
INTRAMUSCULAR | Status: AC
  Filled 2023-02-19: qty 30

## 2023-02-19 MED ORDER — PROPOFOL 10 MG/ML IV BOLUS
INTRAVENOUS | Status: AC
  Filled 2023-02-19: qty 20

## 2023-02-19 MED ORDER — INSULIN ASPART 100 UNIT/ML IJ SOLN: 0.0000 [IU] | Freq: Every day | INTRAMUSCULAR | Status: DC

## 2023-02-19 MED ORDER — OXYCODONE HCL 5 MG PO TABS: 5.0000 mg | ORAL_TABLET | Freq: Once | ORAL | Status: DC | PRN

## 2023-02-19 MED ORDER — SODIUM CHLORIDE 0.9 % IV SOLN
100.0000 mg | INTRAVENOUS | Status: DC
  Administered 2023-02-19 – 2023-02-23 (×5): 100 mg via INTRAVENOUS
  Filled 2023-02-19 (×5): qty 5

## 2023-02-19 MED ORDER — PHENYLEPHRINE HCL (PRESSORS) 10 MG/ML IV SOLN
INTRAVENOUS | Status: AC
  Filled 2023-02-19: qty 1

## 2023-02-19 MED ORDER — ONDANSETRON HCL 4 MG/2ML IJ SOLN
4.0000 mg | Freq: Four times a day (QID) | INTRAMUSCULAR | Status: DC | PRN
  Administered 2023-02-19: 4 mg via INTRAVENOUS
  Filled 2023-02-19: qty 2

## 2023-02-19 MED ORDER — ACETAMINOPHEN 10 MG/ML IV SOLN: 1000.0000 mg | Freq: Once | INTRAVENOUS | Status: DC | PRN

## 2023-02-19 MED ORDER — LACTATED RINGERS IV SOLN: INTRAVENOUS | Status: DC | PRN

## 2023-02-19 MED ORDER — CHLORHEXIDINE GLUCONATE 0.12 % MT SOLN
OROMUCOSAL | Status: AC
  Filled 2023-02-19: qty 15

## 2023-02-19 MED ORDER — ALBUTEROL SULFATE (2.5 MG/3ML) 0.083% IN NEBU
INHALATION_SOLUTION | RESPIRATORY_TRACT | Status: AC
  Administered 2023-02-19: 2.5 mg
  Filled 2023-02-19: qty 3

## 2023-02-19 MED ORDER — OXYCODONE HCL 5 MG/5ML PO SOLN: 5.0000 mg | Freq: Once | ORAL | Status: DC | PRN

## 2023-02-19 MED ORDER — SODIUM CHLORIDE 0.9 % IV SOLN
2.0000 g | INTRAVENOUS | Status: DC
  Administered 2023-02-19: 2 g via INTRAVENOUS
  Filled 2023-02-19: qty 20

## 2023-02-19 MED ORDER — ALBUTEROL SULFATE (2.5 MG/3ML) 0.083% IN NEBU: 2.5000 mg | INHALATION_SOLUTION | Freq: Once | RESPIRATORY_TRACT | Status: DC

## 2023-02-19 MED ORDER — ROCURONIUM BROMIDE 10 MG/ML (PF) SYRINGE
PREFILLED_SYRINGE | INTRAVENOUS | Status: DC | PRN
  Administered 2023-02-19: 80 mg via INTRAVENOUS

## 2023-02-19 MED ORDER — INSULIN ASPART 100 UNIT/ML IJ SOLN
0.0000 [IU] | Freq: Three times a day (TID) | INTRAMUSCULAR | Status: DC
  Administered 2023-02-23 – 2023-03-02 (×10): 1 [IU] via SUBCUTANEOUS

## 2023-02-19 MED ORDER — ONDANSETRON HCL 4 MG/2ML IJ SOLN
INTRAMUSCULAR | Status: DC | PRN
Start: 2023-02-19 — End: 2023-02-19
  Administered 2023-02-19: 4 mg via INTRAVENOUS

## 2023-02-19 SURGICAL SUPPLY — 46 items
ADH SKN CLS APL DERMABOND .7 (GAUZE/BANDAGES/DRESSINGS) ×1
BAG COUNTER SPONGE SURGICOUNT (BAG) ×2 IMPLANT
BAG DECANTER FOR FLEXI CONT (MISCELLANEOUS) ×2 IMPLANT
BAG SPNG CNTER NS LX DISP (BAG) ×1
BIOPATCH RED 1 DISK 7.0 (GAUZE/BANDAGES/DRESSINGS) ×2 IMPLANT
CATH PALINDROME-P 19CM W/VT (CATHETERS) IMPLANT
CATH PALINDROME-P 23CM W/VT (CATHETERS) IMPLANT
CATH PALINDROME-P 28CM W/VT (CATHETERS) IMPLANT
CATH STRAIGHT 5FR 65CM (CATHETERS) IMPLANT
COVER PROBE W GEL 5X96 (DRAPES) ×2 IMPLANT
COVER SURGICAL LIGHT HANDLE (MISCELLANEOUS) ×2 IMPLANT
DERMABOND ADVANCED .7 DNX12 (GAUZE/BANDAGES/DRESSINGS) ×2 IMPLANT
DRAPE C-ARM 42X72 X-RAY (DRAPES) ×2 IMPLANT
DRAPE CHEST BREAST 15X10 FENES (DRAPES) ×2 IMPLANT
GAUZE 4X4 16PLY ~~LOC~~+RFID DBL (SPONGE) ×2 IMPLANT
GAUZE SPONGE 2X2 8PLY STRL LF (GAUZE/BANDAGES/DRESSINGS) IMPLANT
GLOVE BIOGEL PI IND STRL 8 (GLOVE) ×2 IMPLANT
GOWN STRL REUS W/ TWL LRG LVL3 (GOWN DISPOSABLE) ×4 IMPLANT
GOWN STRL REUS W/TWL 2XL LVL3 (GOWN DISPOSABLE) ×4 IMPLANT
GOWN STRL REUS W/TWL LRG LVL3 (GOWN DISPOSABLE) ×2
KIT BASIN OR (CUSTOM PROCEDURE TRAY) ×2 IMPLANT
KIT PALINDROME-P 55CM (CATHETERS) IMPLANT
KIT TURNOVER KIT B (KITS) ×2 IMPLANT
NDL 18GX1X1/2 (RX/OR ONLY) (NEEDLE) ×2 IMPLANT
NDL HYPO 25GX1X1/2 BEV (NEEDLE) ×2 IMPLANT
NEEDLE 18GX1X1/2 (RX/OR ONLY) (NEEDLE) ×1 IMPLANT
NEEDLE HYPO 25GX1X1/2 BEV (NEEDLE) IMPLANT
NS IRRIG 1000ML POUR BTL (IV SOLUTION) ×2 IMPLANT
PACK BASIC III (CUSTOM PROCEDURE TRAY) ×1
PACK SRG BSC III STRL LF ECLPS (CUSTOM PROCEDURE TRAY) ×2 IMPLANT
PAD ARMBOARD 7.5X6 YLW CONV (MISCELLANEOUS) ×4 IMPLANT
SET MICROPUNCTURE 5F STIFF (MISCELLANEOUS) IMPLANT
SOAP 2 % CHG 4 OZ (WOUND CARE) ×2 IMPLANT
SPIKE FLUID TRANSFER (MISCELLANEOUS) ×2 IMPLANT
SUT ETHIBOND X763 2 0 SH 1 (SUTURE) IMPLANT
SUT ETHILON 3 0 PS 1 (SUTURE) ×2 IMPLANT
SUT MNCRL AB 4-0 PS2 18 (SUTURE) ×2 IMPLANT
SUT PROLENE 6 0 BV (SUTURE) IMPLANT
SYR 10ML LL (SYRINGE) ×2 IMPLANT
SYR 20ML LL LF (SYRINGE) ×4 IMPLANT
SYR 5ML LL (SYRINGE) ×2 IMPLANT
SYR CONTROL 10ML LL (SYRINGE) ×2 IMPLANT
TOWEL GREEN STERILE (TOWEL DISPOSABLE) ×2 IMPLANT
TOWEL GREEN STERILE FF (TOWEL DISPOSABLE) ×2 IMPLANT
WATER STERILE IRR 1000ML POUR (IV SOLUTION) ×2 IMPLANT
WIRE AMPLATZ SS-J .035X180CM (WIRE) IMPLANT

## 2023-02-19 NOTE — Progress Notes (Signed)
eLink Physician-Brief Progress Note Patient Name: [name] [name] DOB: 01-Jul-1951 MRN: [id number]   Date of Service  02/19/2023  HPI/Events of Note  Notified by pharmacy of positive blood culture results.  Cultures are positive for staph species as well as Candida glabrata.  Already on vancomycin and Zosyn.  eICU Interventions  Micafungin added to regimen.  He might need a TEE and an ophthalmologic exam.     Intervention Category Minor Interventions: Other:  Carilyn Goodpasture 02/19/2023, 1:07 AM

## 2023-02-19 NOTE — Anesthesia Preprocedure Evaluation (Addendum)
Anesthesia Evaluation  Patient identified by MRN, date of birth, ID band Patient awake    Reviewed: Allergy & Precautions, NPO status , Patient's Chart, lab work & pertinent test results, reviewed documented beta blocker date and time   History of Anesthesia Complications Negative for: history of anesthetic complications  Airway Mallampati: II  TM Distance: >3 FB Neck ROM: Full    Dental no notable dental hx. (+) Dental Advisory Given   Pulmonary asthma , sleep apnea , COPD, former smoker   Pulmonary exam normal        Cardiovascular Exercise Tolerance: Poor hypertension, + angina  + CAD, + Past MI and + CABG  + dysrhythmias  Rhythm:Regular Rate:Tachycardia - Systolic murmurs    Neuro/Psych  Neuromuscular disease    GI/Hepatic hiatal hernia, PUD,GERD  ,,  Endo/Other  diabetes    Renal/GU ESRF and DialysisRenal disease     Musculoskeletal  (+) Arthritis ,    Abdominal   Peds  Hematology  (+) Blood dyscrasia, anemia   Anesthesia Other Findings   Reproductive/Obstetrics                             Anesthesia Physical Anesthesia Plan  ASA: 4  Anesthesia Plan: General   Post-op Pain Management:    Induction: Intravenous  PONV Risk Score and Plan: 1 and Treatment may vary due to age or medical condition  Airway Management Planned: Oral ETT  Additional Equipment:   Intra-op Plan:   Post-operative Plan: Extubation in OR and Possible Post-op intubation/ventilation  Informed Consent:      Dental advisory given  Plan Discussed with: Surgeon  Anesthesia Plan Comments:        Anesthesia Quick Evaluation

## 2023-02-19 NOTE — Op Note (Signed)
    NAME: IZAYAH GUILLORY    MRN: [id number] DOB: 09-29-1950    DATE OF OPERATION: 02/19/2023  PREOP DIAGNOSIS:    Infected peritoneal dialysis catheter  POSTOP DIAGNOSIS:    Same  PROCEDURE:    Peritoneal dialysis catheter removal  SURGEON: Victorino Sparrow  ASSIST: Kayren Eaves, PA  ANESTHESIA: General   EBL: 10ml  INDICATIONS:    KENNISON CAMPI is a 72 y.o. male with previous history of peritoneal dialysis catheter placement.  He presented with COVID as well as pneumonia and severe abdominal pain.  Cell count demonstrates peritoneal infection.  After discussing risk and benefits of peritoneal dialysis catheter removal for infection, Louise elected to proceed.  FINDINGS:   Successful removal of tunneled dialysis catheter removal through 3 separate incisions  TECHNIQUE:   Patient is brought to the OR laid in supine position.  General anesthesia induced and patient was prepped and draped in standard fashion.  The case began with ultrasound insonation of the entirety of the peritoneal dialysis catheter.  This catheter been placed at outside hospital, and all 3 of the felt tamps were in the abdominal muscles.  3 separate incisions were made over the felt rings.  These were taken down to the abdominal wall, and rectus fascia opened.  The entirety of the catheter was excised.  Cultures were taken.  Wound beds were closed in layers with Ethibond deep at the muscular fascia, followed by 2-0 Vicryl, Monocryl and Dermabond at the level of the skin.    Ladonna Snide, MD [facility] DATE OF DICTATION:   02/19/2023

## 2023-02-19 NOTE — Progress Notes (Signed)
   02/19/23 2339  BiPAP/CPAP/SIPAP  $ Non-Invasive Ventilator  Non-Invasive Vent Initial  BiPAP/CPAP/SIPAP Pt Type Adult  BiPAP/CPAP/SIPAP V60  Mask Type Full face mask  Mask Size Medium  Set Rate 14 breaths/min  Respiratory Rate 26 breaths/min  IPAP 15 cmH20  EPAP 5 cmH2O  FiO2 (%) 40 %  Minute Ventilation 16.5  Leak 1  Peak Inspiratory Pressure (PIP) 11  Tidal Volume (Vt) 519  Patient Home Equipment No  Auto Titrate No  Press High Alarm 25 cmH2O  Press Low Alarm 5 cmH2O  BiPAP/CPAP /SiPAP Vitals  Pulse Rate 82  Resp (!) 26  SpO2 94 %

## 2023-02-19 NOTE — Consult Note (Addendum)
Hospital Consult    Reason for Consult:  PD catheter removal Requesting Physician:  Arlean Hopping, MD MRN #:  [id number]  History of Present Illness: [name] [name] is a 72 y.o. male with a PMH of Bells Palsy, CAD, COPD, DMII, HTN, HLD, and ESRD on peritoneal dialysis who was admitted to [facility] on 7/31 with encephalopathy and shortness of breath. He was found to have pneumonia and transaminitis and was started on broad spectrum antibiotics. Upon transfer to [facility] it was felt he had peritonitis due to an infected PD catheter. Peritoneal fluid samples were hazy and had elevated cell counts of 2200.  The patient underwent peritoneal dialysis on 8/2 and it was hopeful his infection could be cleared without removal of his catheter. Unfortunately his PD catheter malfunctioned on 8/3 and he required temporary right internal jugular catheter placement. He then underwent emergent dialysis with his internal jugular catheter.   We were consulted for removal of his PD catheter. It has been felt the patient cannot clear his infection without removal of his catheter. He is currently still receiving IV abx and his encephalopathy is improved this morning. He had positive blood cultures this morning for Staph and Candida.  On exam this morning the patient is resting in bed. He denies any pain but feels very tired. He understands his catheter needs to be removed. He has not eaten or drank anything today.  Past Medical History:  Diagnosis Date   Anemia    Arthritis    Asthma    Bell palsy    CAD (coronary artery disease)    a. 2014 MV: abnl w/ infap ischemia; b. 03/2013 Cath: aneurysmal bleb in the LAD w/ otw nonobs dzs-->Med Rx.   Chronic back pain    Chronic knee pain    a. 09/2015 s/p R TKA.   Chronic pain    Chronic shoulder pain    Chronic sinusitis    COPD (chronic obstructive pulmonary disease) (HCC)    Diabetes mellitus without complication (HCC)    type II    ESRD on peritoneal dialysis (HCC)    on  peritoneal dialysis, [facility]   Essential hypertension    GERD (gastroesophageal reflux disease)    Gout    Gout    Hepatomegaly    noted on noncontrast CT 2015   History of hiatal hernia    Hyperlipidemia    Lateral meniscus tear    Obesity    Truncal   Obstructive sleep apnea    does not use cpap    On home oxygen therapy    uses 2l when is going somewhere per patient    PUD (peptic ulcer disease)    remote, reports f/u EGD about 8 years ago unremarkable    Reactive airway disease    related to exposure to chemical during 9/11   Sinusitis    Vitamin D deficiency     Past Surgical History:  Procedure Laterality Date   ASAD LT SHOULDER  12/15/2008   left shoulder   AV FISTULA PLACEMENT Left 08/09/2016   Procedure: BRACHIOCEPHALIC ARTERIOVENOUS (AV) FISTULA CREATION LEFT ARM;  Surgeon: Sherren Kerns, MD;  Location: [facility] OR;  Service: Vascular;  Laterality: Left;   CAPD INSERTION N/A 10/07/2018   Procedure: LAPAROSCOPIC PERITONEAL CATHETER PLACEMENT;  Surgeon: Rodman Pickle, MD;  Location: [facility] ORS;  Service: General;  Laterality: N/A;   CATARACT EXTRACTION W/PHACO Left 03/28/2016   Procedure: CATARACT EXTRACTION PHACO AND INTRAOCULAR LENS PLACEMENT LEFT EYE;  Surgeon:  Jethro Bolus, MD;  Location: [facility] ORS;  Service: Ophthalmology;  Laterality: Left;  CDE: 4.77   CATARACT EXTRACTION W/PHACO Right 04/11/2016   Procedure: CATARACT EXTRACTION PHACO AND INTRAOCULAR LENS PLACEMENT RIGHT EYE; CDE:  4.74;  Surgeon: Jethro Bolus, MD;  Location: [facility] ORS;  Service: Ophthalmology;  Laterality: Right;   COLONOSCOPY  10/15/2008   Fields: Rectal polyp obliterated, not retrieved, hemorrhoids, single ascending colon diverticulum near the CV. Next colonoscopy April 2020   COLONOSCOPY N/A 12/25/2014   SLF: 1. Colorectal polyps (2) removed 2. Small internal hemorrhoids 3. the left colon is severely redundant. hyperplastic polyps   CORONARY ARTERY BYPASS GRAFT N/A 06/05/2022    Procedure: OFF PUMP CORONARY ARTERY BYPASS GRAFTING (CABG) X 2 BYPASSES USING LEFT INTERNAL MAMMARY ARTERY AND RIGHT LEG GREATER SAPHENOUS VEIN HARVESTED ENDOSCOPICALLY;  Surgeon: Corliss Skains, MD;  Location: [facility] OR;  Service: Open Heart Surgery;  Laterality: N/A;   CORONARY STENT INTERVENTION N/A 07/25/2021   Procedure: CORONARY STENT INTERVENTION;  Surgeon: Tonny Bollman, MD;  Location: [facility] INVASIVE CV LAB;  Service: Cardiovascular;  Laterality: N/A;   CORONARY STENT INTERVENTION N/A 12/26/2021   Procedure: CORONARY STENT INTERVENTION;  Surgeon: Yvonne Kendall, MD;  Location: [facility] INVASIVE CV LAB;  Service: Cardiovascular;  Laterality: N/A;   CORONARY STENT INTERVENTION N/A 01/20/2022   Procedure: CORONARY STENT INTERVENTION;  Surgeon: Tonny Bollman, MD;  Location: [facility] INVASIVE CV LAB;  Service: Cardiovascular;  Laterality: N/A;   DOPPLER ECHOCARDIOGRAPHY     ESOPHAGOGASTRODUODENOSCOPY N/A 12/25/2014   SLF: 1. Anemia most likely due to CRI, gastritis, gastric polyps 2. Moderate non-erosive gastriits and mild duodenitis.  3.TWo large gstric polyps removed.    EYE SURGERY  12/22/2010   tear duct probing-[place]   FOREIGN BODY REMOVAL  03/29/2011   Procedure: REMOVAL FOREIGN BODY EXTREMITY;  Surgeon: Fuller Canada, MD;  Location: [facility] ORS;  Service: Orthopedics;  Laterality: Right;  Removal Foreign Body Right Thumb   IR FLUORO GUIDE CV LINE RIGHT  08/06/2018   IR FLUORO GUIDE CV LINE RIGHT  02/17/2023   IR US GUIDE VASC ACCESS RIGHT  08/06/2018   IR US GUIDE VASC ACCESS RIGHT  02/17/2023   KNEE ARTHROSCOPY  10/16/2007   left   KNEE ARTHROSCOPY WITH LATERAL MENISECTOMY Right 10/14/2015   Procedure: LEFT KNEE ARTHROSCOPY WITH PARTIAL LATERAL MENISECTOMY;  Surgeon: Vickki Hearing, MD;  Location: [facility]S;  Service: Orthopedics;  Laterality: Right;   LEFT HEART CATH AND CORONARY ANGIOGRAPHY N/A 07/25/2021   Procedure: LEFT HEART CATH AND CORONARY ANGIOGRAPHY;  Surgeon: Tonny Bollman, MD;   Location: [facility] INVASIVE CV LAB;  Service: Cardiovascular;  Laterality: N/A;   LEFT HEART CATH AND CORONARY ANGIOGRAPHY N/A 12/26/2021   Procedure: LEFT HEART CATH AND CORONARY ANGIOGRAPHY;  Surgeon: Yvonne Kendall, MD;  Location: [facility] INVASIVE CV LAB;  Service: Cardiovascular;  Laterality: N/A;   LEFT HEART CATH AND CORONARY ANGIOGRAPHY N/A 01/20/2022   Procedure: LEFT HEART CATH AND CORONARY ANGIOGRAPHY;  Surgeon: Tonny Bollman, MD;  Location: [facility] INVASIVE CV LAB;  Service: Cardiovascular;  Laterality: N/A;   LEFT HEART CATHETERIZATION WITH CORONARY ANGIOGRAM N/A 03/28/2013   Procedure: LEFT HEART CATHETERIZATION WITH CORONARY ANGIOGRAM;  Surgeon: Marykay Lex, MD;  Location: [facility] CATH LAB;  Service: Cardiovascular;  Laterality: N/A;   NM MYOVIEW LTD     PENILE PROSTHESIS IMPLANT N/A 08/16/2015   Procedure: PENILE PROTHESIS INFLATABLE, three piece, Excisional biopsy of Penile ulcer, Penile molding;  Surgeon: Jethro Bolus, MD;  Location: [facility] ORS;  Service:  Urology;  Laterality: N/A;   PENILE PROSTHESIS IMPLANT N/A 12/24/2017   Procedure: REMOVAL AND  REPLACEMENT  COLOPLAST PENILE PROSTHESIS;  Surgeon: Crista Elliot, MD;  Location: [facility] ORS;  Service: Urology;  Laterality: N/A;   QUADRICEPS TENDON REPAIR  07/21/2011   Procedure: REPAIR QUADRICEP TENDON;  Surgeon: Fuller Canada, MD;  Location: [facility];  Service: Orthopedics;  Laterality: Right;   RIGHT/LEFT HEART CATH AND CORONARY ANGIOGRAPHY N/A 05/30/2022   Procedure: RIGHT/LEFT HEART CATH AND CORONARY ANGIOGRAPHY;  Surgeon: Corky Crafts, MD;  Location: [facility] INVASIVE CV LAB;  Service: Cardiovascular;  Laterality: N/A;   TEE WITHOUT CARDIOVERSION N/A 06/05/2022   Procedure: TRANSESOPHAGEAL ECHOCARDIOGRAM (TEE);  Surgeon: Corliss Skains, MD;  Location: [facility] OR;  Service: Open Heart Surgery;  Laterality: N/A;   TOENAIL EXCISION     removed x2-bilateral   UMBILICAL HERNIA REPAIR  07/17/2005   [place]    Allergies  Allergen  Reactions   Opana [Oxymorphone Hcl] Itching   Tramadol Itching    Prior to Admission medications   Medication Sig Start Date End Date Taking? Authorizing Provider  albuterol (VENTOLIN HFA) 108 (90 Base) MCG/ACT inhaler Inhale 2 puffs into the lungs every 6 (six) hours as needed for wheezing or shortness of breath. 03/25/21  Yes Oretha Milch, MD  ALPRAZolam Prudy Feeler) 0.25 MG tablet Take 1 tablet (0.25 mg total) by mouth at bedtime as needed for anxiety. Take before MRI 11/23/22  Yes Vickki Hearing, MD  aspirin EC 81 MG tablet Take 81 mg by mouth daily. Swallow whole.   Yes [provider]  atorvastatin (LIPITOR) 80 MG tablet Take 1 tablet (80 mg total) by mouth daily. 06/20/22  Yes Doree Fudge M, PA-C  azithromycin (ZITHROMAX) 250 MG tablet Take 1 tablet (250 mg total) by mouth daily. Take first 2 tablets together, then 1 every day until finished. 02/15/23  Yes Pollina, Canary Brim, MD  clopidogrel (PLAVIX) 75 MG tablet Take 1 tablet (75 mg total) by mouth daily. Please take first dose at 9 pm on 12/4 06/19/22  Yes Doree Fudge M, PA-C  esomeprazole (NEXIUM) 40 MG capsule Take 1 capsule (40 mg total) by mouth 2 (two) times daily before a meal. 08/01/22 02/16/23 Yes Mahon, Frederik Schmidt, NP  gabapentin (NEURONTIN) 100 MG capsule Take 100 mg by mouth 3 (three) times daily. 08/18/22  Yes [provider]  HYDROcodone bit-homatropine (HYCODAN) 5-1.5 MG/5ML syrup Take 5 mLs by mouth every 6 (six) hours as needed for cough. 02/15/23  Yes Pollina, Canary Brim, MD  insulin glargine (LANTUS SOLOSTAR) 100 UNIT/ML Solostar Pen Inject 22 Units into the skin at bedtime. 07/12/22  Yes Reardon, Freddi Starr, NP  linagliptin (TRADJENTA) 5 MG TABS tablet Take 1 tablet (5 mg total) by mouth daily. 07/12/22  Yes Reardon, Freddi Starr, NP  midodrine (PROAMATINE) 5 MG tablet Take 5 mg by mouth 3 (three) times daily with meals.   Yes [provider]  ondansetron (ZOFRAN-ODT) 4 MG disintegrating  tablet 4mg  ODT q4 hours prn nausea/vomit 02/15/23  Yes Pollina, Canary Brim, MD  predniSONE (DELTASONE) 20 MG tablet Take 2 tablets (40 mg total) by mouth daily with breakfast. 02/15/23  Yes Pollina, Canary Brim, MD  SYMBICORT 160-4.5 MCG/ACT inhaler INHALE 2 PUFFS INTO THE LUNGS TWICE DAILY. 01/08/23  Yes Leslye Peer, MD  Vitamin D, Ergocalciferol, (DRISDOL) 1.25 MG (50000 UNIT) CAPS capsule Take 50,000 Units by mouth every 7 (seven) days. Monday 05/03/21  Yes [provider]  DROPLET PEN NEEDLES 31G X 5 MM MISC USE TO INJECT INSULIN DAILY AS DIRECTED 03/17/22   Nida, Denman George, MD  fluticasone (FLONASE) 50 MCG/ACT nasal spray Place 1 spray into both nostrils as needed for allergies or rhinitis. Patient not taking: Reported on 02/16/2023 10/11/22   [provider]  [facility] ULTRA test strip USE TO CHECK BLOOD SUGAR TWICE DAILY 03/21/22   Dani Gobble, NP  sevelamer (RENAGEL) 800 MG tablet Take 2 tablets (1,600 mg total) by mouth 3 (three) times daily with meals. Patient not taking: Reported on 02/16/2023 06/19/22   Ardelle Balls, PA-C    Social History   Socioeconomic History   Marital status: Married    Spouse name: Not on file   Number of children: 2   Years of education: 12th grade   Highest education level: Not on file  Occupational History   Occupation: disabled   Occupation:      Associate Professor: UNEMPLOYED  Tobacco Use   Smoking status: Former    Current packs/day: 0.00    Average packs/day: 1 pack/day for 25.0 years (25.0 ttl pk-yrs)    Types: Cigarettes    Start date: 03/27/1985    Quit date: 03/27/2010    Years since quitting: 12.9   Smokeless tobacco: Never   Tobacco comments:    Quit x 7 years  Vaping Use   Vaping status: Never Used  Substance and Sexual Activity   Alcohol use: Not Currently    Comment: occasionally   Drug use: Yes    Types: Marijuana    Comment: cocaine- last time used- 11/24/2017 , marijuana-    Sexual activity: Yes   Other Topics Concern   Not on file  Social History Narrative   He quit smoking in 2010. 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 He developed pulmonary problems, became disabled because of lower airway disease in 2009.       WATCHES BASKETBALL. HIS TEAM IS [place].   Social Determinants of Health   Financial Resource Strain: Low Risk  (12/29/2021)   Overall Financial Resource Strain (CARDIA)    Difficulty of Paying Living Expenses: Not hard at all  Food Insecurity: No Food Insecurity (06/07/2022)   Hunger Vital Sign    Worried About Running Out of Food in the Last Year: Never true    Ran Out of Food in the Last Year: Never true  Transportation Needs: No Transportation Needs (06/07/2022)   PRAPARE - Administrator, Civil Service (Medical): No    Lack of Transportation (Non-Medical): No  Physical Activity: Sufficiently Active (12/29/2021)   Exercise Vital Sign    Days of Exercise per Week: 3 days    Minutes of Exercise per Session: 60 min  Stress: No Stress Concern Present (12/29/2021)   [employer] of Occupational Health - Occupational Stress Questionnaire    Feeling of Stress : Not at all  Social Connections: Unknown (09/18/2022)   [facility] from [facility]   Social Network    Social Network: Not on file  Intimate Partner Violence: Unknown (09/18/2022)   Received from [facility]   HITS    Physically Hurt: Not on file    Insult or Talk Down To: Not on file    Threaten Physical Harm: Not on file    Scream or Curse: Not on file     Family History  Problem Relation Age of Onset   Hypertension Mother  MI   Cancer Mother        breast    Diabetes Mother    Diabetes Father    Hypertension Father    Hypertension Sister    Diabetes Sister    Arthritis Other    Asthma Other    Lung disease Other    Anesthesia problems Neg Hx    Hypotension Neg Hx    Malignant hyperthermia Neg Hx    Pseudochol deficiency Neg  Hx    Colon cancer Neg Hx     ROS: [x]  Positive   [ ]  Negative   [ ]  All sytems reviewed and are negative  Cardiac: []  chest pain/pressure []  hx MI []  SOB   Vascular: []  pain in legs while walking []  pain in legs at rest []  pain in legs at night []  non-healing ulcers []  hx of DVT []  swelling in legs  Pulmonary: [x]  asthma/wheezing []  home O2  Neurologic: []  hx of CVA []  mini stroke   Hematologic: []  hx of cancer  Endocrine:   [x]  diabetes []  thyroid disease  GI []  GERD  GU: [x]  CKD/renal failure []  HD--[]  M/W/F or []  T/T/S  Psychiatric: []  anxiety []  depression  Musculoskeletal: []  arthritis []  joint pain  Integumentary: []  rashes []  ulcers  Constitutional: []  fever  []  chills  Physical Examination  Vitals:   02/19/23 0830 02/19/23 0900  BP: 98/77 102/71  Pulse: 92 79  Resp: (!) 22 (!) 22  Temp:    SpO2: (!) 89% 93%   Body mass index is 35.48 kg/m.  General: obese male, laying in bed comfortably Gait: Not observed HENT: WNL, normocephalic Pulmonary: slightly increased work of breathing, on 4L nasal cannula Cardiac: irregularly irregular Abdomen: soft, mild diffuse tenderness with PD catheter in place Skin: without rashes Vascular Exam/Pulses: BLE warm and well perfused Extremities: 1+ BLE edema Musculoskeletal: generalized weakness Neurologic: A&O X 3 Psychiatric:  The pt has Normal affect.   CBC    Component Value Date/Time   WBC 5.0 02/19/2023 0458   RBC 3.45 (L) 02/19/2023 0458   HGB 9.4 (L) 02/19/2023 0458   HGB 11.3 (L) 12/22/2021 1017   HCT 28.9 (L) 02/19/2023 0458   HCT 33.7 (L) 12/22/2021 1017   PLT 104 (L) 02/19/2023 0458   PLT 117 (L) 12/22/2021 1017   MCV 83.8 02/19/2023 0458   MCV 91 12/22/2021 1017   MCH 27.2 02/19/2023 0458   MCHC 32.5 02/19/2023 0458   RDW 16.1 (H) 02/19/2023 0458   RDW 13.7 12/22/2021 1017   LYMPHSABS 0.1 (L) 02/19/2023 0458   MONOABS 0.1 02/19/2023 0458   EOSABS 0.0 02/19/2023 0458    BASOSABS 0.0 02/19/2023 0458    BMET    Component Value Date/Time   NA 135 02/19/2023 0458   NA 136 12/22/2021 1017   K 3.4 (L) 02/19/2023 0458   CL 94 (L) 02/19/2023 0458   CO2 29 02/19/2023 0458   GLUCOSE 109 (H) 02/19/2023 0458   BUN 55 (H) 02/19/2023 0458   BUN 58 (H) 12/22/2021 1017   CREATININE 11.66 (H) 02/19/2023 0458   CREATININE 18.89 (H) 02/09/2020 1126   CALCIUM 7.8 (L) 02/19/2023 0458   GFRNONAA 4 (L) 02/19/2023 0458   GFRNONAA 2 (L) 02/09/2020 1126   GFRAA <4 (L) 02/09/2020 1126    COAGS: Lab Results  Component Value Date   INR 1.2 02/18/2023   INR 1.2 02/17/2023   INR 1.3 (H) 02/16/2023      ASSESSMENT/PLAN: This is a  72 y.o. male in need of PD cath removal  -Patient was admitted on 7/31 with encephalopathy, pneumonia, and peritonitis. Likely source of infection from PD cath. Has been on broad spectrum antibiotics and recent blood cultures today are positive for Staph and Candida -We have been consulted for removal of the patients PD cath to clear his source of infection. He underwent dialysis on 8/3 with no plans for dialysis today. He has a temporary right internal jugular cath for dialysis needs. -On exam the patient is comfortable with slightly increased work of breathing. His PD cath is in place with generalized abdominal tenderness -We can plan for removal of PD cath in the OR today. Patient will remain NPO. The patient is agreeable and would like his wife to be informed.  -Dr. Karin Lieu will evaluate the patient later this morning and determine further treatment plans   Loel Dubonnet, PA-C [facility] [phone]   VASCULAR STAFF ADDENDUM: I have independently interviewed and examined the patient. I agree with the above.  Pt seen and examined. Dicussed the importance of PD catheter removal as it is a source for infection.  After discussing the risks and benefits, Jeramy elected to proceed.   Fara Olden, MD [facility] Phone Number: [phone] 02/19/2023 3:39 PM

## 2023-02-19 NOTE — Progress Notes (Signed)
Contacted by Nolon Lennert with request that pt's H and P and renal consult/notes be faxed to clinic for pt's PD RN to review. Clinicals faxed to clinic per their request. Will assist as needed.   Olivia Canter Renal Navigator [phone]

## 2023-02-19 NOTE — Progress Notes (Signed)
Subjective: blood cx's from 8/04 are growing yeast and GPC. BCID showed Staph + and c. Glabrata +. PD fluid TNC was 2200 on 8/1 and down to 135 yesterday 8/04.   Objective Vital signs in last 24 hours: Vitals:   02/19/23 0819 02/19/23 0830 02/19/23 0900 02/19/23 1116  BP: 118/86 98/77 102/71   Pulse:  92 79   Resp: 14 (!) 22 (!) 22   Temp:    (!) 97.5 F (36.4 C)  TempSrc:    Oral  SpO2:  (!) 89% 93%   Weight:      Height:       Weight change:   Physical Exam: General: ill appearing  Heart: RRR no MRG Lungs: no coughing, BL rhonchi, no rales Abdomen: soft, mild TTP over the abdomen Extremities: No pedal edema Dialysis Access: PD cath present  LUA AVF positive thrill but very small and tortuous temp HD cath RIJ in place Neuro: more alert today and jerky movement resolved   Home med --> albuterol, xanax, aspirin, aotrvastatin, clopidogrel, esomeprazole, gabapentin, insulin glargine, linagliptin, midodrine   Outpatient PD orders:  Followed by [facility] (note his PD RN was on vacation  but nephrology called [facility] for orders  [facility] - davita Baraga Edw - 100kg 5 cycles, 3000 fill vol (note pt states on consult he thinks is 2.5 liters), total time: 10 hrs, last fill 1500 cc Mircera 150 mcg q monthly Venofer 100mg  once a month    CXR 8/05 - IMPRESSION: Persistent bibasilar opacities, which may represent atelectasis or pneumonia.  Assessment/ Plan: Peritonitis PD catheter related peritonitis - initial cell count 2200, 93% PMNs. Started on IV vanc/ cefepime. PD fluid cx is negative today to date, however, blood cx's today are + for GPC and fungus (BCID showing Staph and C. Glabrata). Abx changed to rocephin and micafungin. Will need to remove PD cath given fungemia. Have consulted [facility], appreciate assistance.  ESRD - PD not working 8/2 --> IR placed temp HD catheter  and underwent HD 8/3.  Will use temp cath for HD but will need a line holiday at  some point. Will need PD cath removed as above. HD tomorrow.  Metabolic encephalopathy - due to sepsis from peritonitis, seems to be improving Acute hypoxic resp failure - extubated, much better HTN/volume - BP's a bit soft in 100s, is at his dry wt, euvolemic on exam.  Transaminitis - low BP's possibly, others, per pmd Anemia -of CKD-Hgb 10.6 <12.2 no ESA needed currently monitor Secondary hyperparathyroidism -corrected calcium 10, no vitamin D, phosphorus 8.2, Renvela binder   Rob   MD  [facility] 02/19/2023, 12:48 PM  Recent Labs  Lab 02/16/23 0117 02/17/23 0351 02/18/23 0425 02/19/23 0458  HGB 10.6*   < > 9.8* 9.4*  ALBUMIN <1.5*   < > <1.5* <1.5*  CALCIUM 7.2*   < > 7.2* 7.8*  PHOS 8.2*  --   --   --   CREATININE 14.71*   < > 9.61* 11.66*  K 4.3   < > 3.1* 3.4*   < > = values in this interval not displayed.    Inpatient medications:  aspirin EC  81 mg Oral Daily   Chlorhexidine Gluconate Cloth  6 each Topical Q0600   clopidogrel  75 mg Oral Daily   heparin  5,000 Units Subcutaneous Q8H   insulin aspart  0-5 Units Subcutaneous QHS   insulin aspart  0-6 Units Subcutaneous TID WC   insulin glargine-yfgn  15  Units Subcutaneous Daily   ipratropium-albuterol  3 mL Nebulization TID   midodrine  5 mg Oral TID WC   mometasone-formoterol  2 puff Inhalation BID   pantoprazole  40 mg Oral BID   potassium chloride  20 mEq Oral Once   sevelamer carbonate  2,400 mg Oral TID WC   sodium chloride flush  3 mL Intravenous Q12H    cefTRIAXone (ROCEPHIN)  IV     dextrose 25 mL/hr at 02/19/23 0830   micafungin (MYCAMINE) 100 mg in sodium chloride 0.9 % 100 mL IVPB Stopped (02/19/23 0316)   albuterol, heparin sodium (porcine) 3,000 Units in dialysis solution 2.5% low-MG/low-CA 6,000 mL dialysis solution, ondansetron (ZOFRAN) IV         Labs: Basic Metabolic Panel: Recent Labs  Lab 02/16/23 0117 02/17/23 0351 02/17/23 2332 02/18/23 0425 02/19/23 0458  NA 130*   < > 133*  132* 135  K 4.3   < > 3.3* 3.1* 3.4*  CL 91*   < > 92* 93* 94*  CO2 19*   < > 25 26 29   GLUCOSE 284*   < > 154* 92 109*  BUN 57*   < > 35* 38* 55*  CREATININE 14.71*   < > 8.36* 9.61* 11.66*  CALCIUM 7.2*   < > 7.3* 7.2* 7.8*  PHOS 8.2*  --   --   --   --    < > = values in this interval not displayed.   Liver Function Tests: Recent Labs  Lab 02/17/23 2332 02/18/23 0425 02/19/23 0458  AST 635* 520* 248*  ALT 686* 594* 359*  ALKPHOS 152* 142* 99  BILITOT 1.3* 1.0 1.2  PROT 5.3* 5.1* 5.0*  ALBUMIN 1.6* <1.5* <1.5*   Recent Labs  Lab 02/15/23 0006  LIPASE 173*   Recent Labs  Lab 02/15/23 0827  AMMONIA 16   CBC: Recent Labs  Lab 02/16/23 0117 02/17/23 0351 02/17/23 2332 02/18/23 0425 02/19/23 0458  WBC 5.1 6.7 5.4 5.1 5.0  NEUTROABS 5.1 5.7  --  4.6 4.7  HGB 10.6* 10.4* 10.3* 9.8* 9.4*  HCT 33.3* 32.3* 30.9* 29.8* 28.9*  MCV 86.7 83.9 81.5 83.9 83.8  PLT 133* 139* 125* 126*  116* 104*   Cardiac Enzymes: Recent Labs  Lab 02/15/23 1110  CKTOTAL 335   CBG: Recent Labs  Lab 02/18/23 1935 02/18/23 2339 02/19/23 0332 02/19/23 0732 02/19/23 1115  GLUCAP 132* 96 96 111* 121*    Studies/Results: DG Chest Port 1 View  Result Date: 02/19/2023 CLINICAL DATA:  40981 Respiratory failure (HCC) 19147 EXAM: PORTABLE CHEST 1 VIEW COMPARISON:  02/17/2023 FINDINGS: Right IJ approach central venous catheter terminates at the level of the right atrium. Stable heart size status post sternotomy and CABG. Slightly low lung volumes. Persistent bibasilar opacities, similar to slightly progressed from prior. No pleural effusion or pneumothorax. IMPRESSION: Persistent bibasilar opacities, which may represent atelectasis or pneumonia. Electronically Signed   By: Duanne Guess D.O.   On: 02/19/2023 08:30   VAS [place] LOWER EXTREMITY VENOUS (DVT)  Result Date: 02/18/2023  Lower Venous DVT Study Patient Name:  [name] [name]  Date of Exam:   02/18/2023 Medical Rec #: [id number]        Accession #:    [id number] Date of Birth: 24-Jul-1950      Patient Gender: M Patient Age:   72 years Exam Location:  [facility] Procedure:      VAS [place] LOWER EXTREMITY VENOUS (DVT) Referring Phys: MURALI RAMASWAMY --------------------------------------------------------------------------------  Indications: Edema.  Comparison Study: No recent prior studies. Performing Technologist: Jean Rosenthal RDMS, RVT  Examination Guidelines: A complete evaluation includes B-mode imaging, spectral Doppler, color Doppler, and power Doppler as needed of all accessible portions of each vessel. Bilateral testing is considered an integral part of a complete examination. Limited examinations for reoccurring indications may be performed as noted. The reflux portion of the exam is performed with the patient in reverse Trendelenburg.  +---------+---------------+---------+-----------+----------+--------------+ RIGHT    CompressibilityPhasicitySpontaneityPropertiesThrombus Aging +---------+---------------+---------+-----------+----------+--------------+ CFV      Full           Yes      Yes                                 +---------+---------------+---------+-----------+----------+--------------+ SFJ      Full                                                        +---------+---------------+---------+-----------+----------+--------------+ FV Prox  Full                                                        +---------+---------------+---------+-----------+----------+--------------+ FV Mid   Full                                                        +---------+---------------+---------+-----------+----------+--------------+ FV DistalFull                                                        +---------+---------------+---------+-----------+----------+--------------+ PFV      Full                                                         +---------+---------------+---------+-----------+----------+--------------+ POP      Full           Yes      Yes                                 +---------+---------------+---------+-----------+----------+--------------+ PTV      Full                                                        +---------+---------------+---------+-----------+----------+--------------+ PERO     Full                                                        +---------+---------------+---------+-----------+----------+--------------+   +---------+---------------+---------+-----------+----------+--------------+  LEFT     CompressibilityPhasicitySpontaneityPropertiesThrombus Aging +---------+---------------+---------+-----------+----------+--------------+ CFV      Full           Yes      Yes                                 +---------+---------------+---------+-----------+----------+--------------+ SFJ      Full                                                        +---------+---------------+---------+-----------+----------+--------------+ FV Prox  Full                                                        +---------+---------------+---------+-----------+----------+--------------+ FV Mid   Full                                                        +---------+---------------+---------+-----------+----------+--------------+ FV DistalFull                                                        +---------+---------------+---------+-----------+----------+--------------+ PFV      Full                                                        +---------+---------------+---------+-----------+----------+--------------+ POP      Full           Yes      Yes                                 +---------+---------------+---------+-----------+----------+--------------+ PTV      Full                                                         +---------+---------------+---------+-----------+----------+--------------+ PERO     Full                                                        +---------+---------------+---------+-----------+----------+--------------+     Summary: RIGHT: - There is no evidence of deep vein thrombosis in the lower extremity.  - No cystic structure found in the popliteal fossa.  LEFT: - There is no evidence of deep vein thrombosis in the lower extremity.  - No  cystic structure found in the popliteal fossa.  *See table(s) above for measurements and observations. Electronically signed by Heath Lark on 02/18/2023 at 5:05:26 PM.    Final    EEG adult  Result Date: 02/18/2023 Windell Norfolk, MD     02/18/2023  9:49 AM Patient Name: [name] [name] MRN: [id number] Epilepsy Attending: Windell Norfolk Referring Physician/Provider: Dr. Delton Coombes Date: 02/18/2023 Duration: 23 minutes Patient history: 43 year man with sepsis and AMS,  EEG to evaluate for seizure Level of alertness: lethargic AEDs during EEG study: None Technical aspects: This EEG study was done with scalp electrodes positioned according to the 10-20 International system of electrode placement. Electrical activity was reviewed with band pass filter of 1-70Hz , sensitivity of 7 uV/mm, display speed of 8mm/sec with a 60Hz  notched filter applied as appropriate. EEG data were recorded continuously and digitally stored.  Video monitoring was available and reviewed as appropriate. Description: EEG showed continuous/intermittent generalized polymorphic sharply contoured 3 to 6 Hz theta-delta slowing. Sleep was not seen. Hyperventilation and photic stimulation were not performed. No seizure or epileptiform discharges seen during this recording. ABNORMALITY - Continuous slow, generalized IMPRESSION: This study is suggestive of mild to moderate diffuse encephalopathy, nonspecific etiology but likely related to sedation, toxic-metabolic etiology. [name] [name]   DG CHEST PORT 1  VIEW  Result Date: 02/17/2023 CLINICAL DATA:  Respiratory distress, shaking. EXAM: PORTABLE CHEST 1 VIEW COMPARISON:  02/16/2023. FINDINGS: The heart size and mediastinal contours are stable. There is atherosclerotic calcification of the aorta. Lung volumes are low and mild airspace disease is noted in the mid to lower lung fields bilaterally. No effusion or pneumothorax. A right internal jugular central venous catheter terminates over the right atrium. Sternotomy wires are present over the midline. IMPRESSION: 1. Mild airspace opacities in the mid to lower lung fields, possible multifocal pneumonia. 2. Right internal jugular central venous catheter terminating in the right atrium. Electronically Signed   By: Thornell Sartorius M.D.   On: 02/17/2023 23:56   Medications:  cefTRIAXone (ROCEPHIN)  IV     dextrose 25 mL/hr at 02/19/23 0830   micafungin (MYCAMINE) 100 mg in sodium chloride 0.9 % 100 mL IVPB Stopped (02/19/23 0316)    aspirin EC  81 mg Oral Daily   Chlorhexidine Gluconate Cloth  6 each Topical Q0600   clopidogrel  75 mg Oral Daily   heparin  5,000 Units Subcutaneous Q8H   insulin aspart  0-5 Units Subcutaneous QHS   insulin aspart  0-6 Units Subcutaneous TID WC   insulin glargine-yfgn  15 Units Subcutaneous Daily   ipratropium-albuterol  3 mL Nebulization TID   midodrine  5 mg Oral TID WC   mometasone-formoterol  2 puff Inhalation BID   pantoprazole  40 mg Oral BID   potassium chloride  20 mEq Oral Once   sevelamer carbonate  2,400 mg Oral TID WC   sodium chloride flush  3 mL Intravenous Q12H

## 2023-02-19 NOTE — Consult Note (Signed)
[facility]  Total days of antibiotics 5               Reason for Consult:candidemia and PD related peritonitis    Referring Physician: byrum  Principal Problem:   Acute encephalopathy Active Problems:   Hyperlipidemia   Essential hypertension   GERD   OSA on CPAP   CAD (coronary artery disease)   Morbid obesity due to excess calories (HCC)   ESRD (end stage renal disease) (HCC)   NSTEMI (non-ST elevated myocardial infarction) (HCC)   S/P CABG x 2   Peritoneal dialysis catheter in place [facility])   Memory loss    HPI: [name] [name] is a 72 y.o. male with hx of ESRD with PD, hx of CAD s/p CABG in 2023, T2DM, COPd, chronic hypotension on midodrine admitted for worsening shortness of breath and confusion on 8/1. He was recent diagnosed with covid in the previous 10 days and was treated with amox/clav. On admit, he was febrile to 102F, WBC of 9.4, he was empirically started on vanco/ctx/metro/azithro. His  CT abdomen pelvis demonstrated multiple patchy airspace opacities concerning for multifocal pneumonia.  His infectious work up showed no growth to date on 8/1 but his blood cx were repeated as he was persistently febrile to 101F. His repeat blood cx from 8/3 now did + yeast, and GPC. BCID identifiying as staph species and c.glabrata. Due to complaint of abdominal pain, he underwent emergent hd as well as peritoneal fluid analysis which showed WBC of 2200 with 93% segs on 8/1 and repeated on 8/4 which showed improvement with wbc of 135 and 46% N. Peritoneal cultures remain negative.he was started on enchinocandins last night. He is now afebrile for < 24hr, however still has abdominal pain. His abtx have been changed to piptazo plus micafungin. He is being followed by pulmonary team, and nephrology team. Vascular surgery has been consulted for removal of PD catheter.  Past Medical History:  Diagnosis Date   Anemia    Arthritis    Asthma    Bell palsy    CAD  (coronary artery disease)    a. 2014 MV: abnl w/ infap ischemia; b. 03/2013 Cath: aneurysmal bleb in the LAD w/ otw nonobs dzs-->Med Rx.   Chronic back pain    Chronic knee pain    a. 09/2015 s/p R TKA.   Chronic pain    Chronic shoulder pain    Chronic sinusitis    COPD (chronic obstructive pulmonary disease) (HCC)    Diabetes mellitus without complication (HCC)    type II    ESRD on peritoneal dialysis (HCC)    on peritoneal dialysis, [facility]   Essential hypertension    GERD (gastroesophageal reflux disease)    Gout    Gout    Hepatomegaly    noted on noncontrast CT 2015   History of hiatal hernia    Hyperlipidemia    Lateral meniscus tear    Obesity    Truncal   Obstructive sleep apnea    does not use cpap    On home oxygen therapy    uses 2l when is going somewhere per patient    PUD (peptic ulcer disease)    remote, reports f/u EGD about 8 years ago unremarkable    Reactive airway disease    related to exposure to chemical during 9/11   Sinusitis    Vitamin D deficiency     Allergies:  Allergies  Allergen Reactions  Opana [Oxymorphone Hcl] Itching   Tramadol Itching    MEDICATIONS:  aspirin EC  81 mg Oral Daily   Chlorhexidine Gluconate Cloth  6 each Topical Q0600   [START ON 02/20/2023] Chlorhexidine Gluconate Cloth  6 each Topical Q0600   clopidogrel  75 mg Oral Daily   heparin  5,000 Units Subcutaneous Q8H   insulin aspart  0-5 Units Subcutaneous QHS   insulin aspart  0-6 Units Subcutaneous TID WC   insulin glargine-yfgn  15 Units Subcutaneous Daily   ipratropium-albuterol  3 mL Nebulization TID   midodrine  5 mg Oral TID WC   mometasone-formoterol  2 puff Inhalation BID   pantoprazole  40 mg Oral BID   potassium chloride  20 mEq Oral Once   sevelamer carbonate  2,400 mg Oral TID WC   sodium chloride flush  3 mL Intravenous Q12H    Social History   Tobacco Use   Smoking status: Former    Current packs/day: 0.00    Average packs/day: 1  pack/day for 25.0 years (25.0 ttl pk-yrs)    Types: Cigarettes    Start date: 03/27/1985    Quit date: 03/27/2010    Years since quitting: 12.9   Smokeless tobacco: Never   Tobacco comments:    Quit x 7 years  Vaping Use   Vaping status: Never Used  Substance Use Topics   Alcohol use: Not Currently    Comment: occasionally   Drug use: Yes    Types: Marijuana    Comment: cocaine- last time used- 11/24/2017 , marijuana-     Family History  Problem Relation Age of Onset   Hypertension Mother        MI   Cancer Mother        breast    Diabetes Mother    Diabetes Father    Hypertension Father    Hypertension Sister    Diabetes Sister    Arthritis Other    Asthma Other    Lung disease Other    Anesthesia problems Neg Hx    Hypotension Neg Hx    Malignant hyperthermia Neg Hx    Pseudochol deficiency Neg Hx    Colon cancer Neg Hx     Review of Systems -  +abdominal pain, fatigue. 12 point ros is otherwise negative  OBJECTIVE: Temp:  [97.5 F (36.4 C)-100.9 F (38.3 C)] 97.5 F (36.4 C) (08/05 1116) Pulse Rate:  [61-200] 79 (08/05 0900) Resp:  [14-38] 22 (08/05 0900) BP: (91-167)/(53-118) 102/71 (08/05 0900) SpO2:  [76 %-99 %] 93 % (08/05 0900) FiO2 (%):  [30 %] 30 % (08/04 2301) Weight:  [99.7 kg] 99.7 kg (08/05 0500) Physical Exam  Constitutional: He is oriented to person, place, and time. He appears well-developed and well-nourished. No distress.  HENT:  Mouth/Throat: Oropharynx is clear and moist. No oropharyngeal exudate.  Cardiovascular: Normal rate, regular rhythm and normal heart sounds. Exam reveals no gallop and no friction rub.  No murmur heard.  Neck: right internal jugular/HD in place Pulmonary/Chest: Effort normal and breath sounds normal. No respiratory distress. He has no wheezes.  Abdominal: Soft. Bowel sounds are normal. He exhibits no distension. There is no tenderness.  Lymphadenopathy:  He has no cervical adenopathy.  Neurological: He is alert  and oriented to person, place, and time.  Skin: Skin is warm and dry. No rash noted. No erythema.  Psychiatric: He has a normal mood and affect. His behavior is normal.   LABS: Results for orders placed  or performed during the hospital encounter of 02/14/23 (from the past 48 hour(s))  Glucose, capillary     Status: Abnormal   Collection Time: 02/17/23  7:48 PM  Result Value Ref Range   Glucose-Capillary 116 (H) 70 - 99 mg/dL    Comment: Glucose reference range applies only to samples taken after fasting for at least 8 hours.  Glucose, capillary     Status: Abnormal   Collection Time: 02/17/23 10:32 PM  Result Value Ref Range   Glucose-Capillary 120 (H) 70 - 99 mg/dL    Comment: Glucose reference range applies only to samples taken after fasting for at least 8 hours.  Hemoglobin A1c     Status: Abnormal   Collection Time: 02/17/23 11:30 PM  Result Value Ref Range   Hgb A1c MFr Bld 7.1 (H) 4.8 - 5.6 %    Comment: (NOTE) Pre diabetes:          5.7%-6.4%  Diabetes:              >6.4%  Glycemic control for   <7.0% adults with diabetes    Mean Plasma Glucose 157.07 mg/dL    Comment: Performed at [facility], [address], [place], [place] [address]  Lactic acid, plasma     Status: None   Collection Time: 02/17/23 11:32 PM  Result Value Ref Range   Lactic Acid, Venous 1.6 0.5 - 1.9 mmol/L    Comment: Performed at [facility], [address] [place], [place] [address]  CBC     Status: Abnormal   Collection Time: 02/17/23 11:32 PM  Result Value Ref Range   WBC 5.4 4.0 - 10.5 K/uL   RBC 3.79 (L) 4.22 - 5.81 MIL/uL   Hemoglobin 10.3 (L) 13.0 - 17.0 g/dL   HCT 19.1 (L) 47.8 - 29.5 %   MCV 81.5 80.0 - 100.0 fL   MCH 27.2 26.0 - 34.0 pg   MCHC 33.3 30.0 - 36.0 g/dL   RDW 62.1 (H) 30.8 - 65.7 %   Platelets 125 (L) 150 - 400 K/uL    Comment: REPEATED TO VERIFY   nRBC 5.6 (H) 0.0 - 0.2 %    Comment: Performed at [facility], [address] [place], [place]  [address]  Comprehensive metabolic panel     Status: Abnormal   Collection Time: 02/17/23 11:32 PM  Result Value Ref Range   Sodium 133 (L) 135 - 145 mmol/L   Potassium 3.3 (L) 3.5 - 5.1 mmol/L   Chloride 92 (L) 98 - 111 mmol/L   CO2 25 22 - 32 mmol/L   Glucose, Bld 154 (H) 70 - 99 mg/dL    Comment: Glucose reference range applies only to samples taken after fasting for at least 8 hours.   BUN 35 (H) 8 - 23 mg/dL   Creatinine, Ser 2.95 (H) 0.61 - 1.24 mg/dL   Calcium 7.3 (L) 8.9 - 10.3 mg/dL   Total Protein 5.3 (L) 6.5 - 8.1 g/dL   Albumin 1.6 (L) 3.5 - 5.0 g/dL   AST 284 (H) 15 - 41 U/L   ALT 686 (H) 0 - 44 U/L   Alkaline Phosphatase 152 (H) 38 - 126 U/L   Total Bilirubin 1.3 (H) 0.3 - 1.2 mg/dL   GFR, Estimated 6 (L) >60 mL/min    Comment: (NOTE) Calculated using the CKD-EPI Creatinine Equation (2021)    Anion gap 16 (H) 5 - 15    Comment: Performed at  [facility], [address] [place], [place] [address]  Protime-INR     Status: Abnormal   Collection Time: 02/17/23 11:32 PM  Result Value Ref Range   Prothrombin Time 15.5 (H) 11.4 - 15.2 seconds   INR 1.2 0.8 - 1.2    Comment: (NOTE) INR goal varies based on device and disease states. Performed at [facility], [address] [place], [place] [address]   D-dimer, quantitative     Status: Abnormal   Collection Time: 02/17/23 11:32 PM  Result Value Ref Range   D-Dimer, Quant 10.53 (H) 0.00 - 0.50 ug/mL-FEU    Comment: (NOTE) At the manufacturer cut-off value of 0.5 g/mL FEU, this assay has a negative predictive value of 95-100%.This assay is intended for use in conjunction with a clinical pretest probability (PTP) assessment model to exclude pulmonary embolism (PE) and deep venous thrombosis (DVT) in outpatients suspected of PE or DVT. Results should be correlated with clinical presentation. Performed at [facility], [address], [place], [place] [address]   Procalcitonin     Status: None    Collection Time: 02/17/23 11:32 PM  Result Value Ref Range   Procalcitonin 130.47 ng/mL    Comment:        Interpretation: PCT >= 10 ng/mL: Important systemic inflammatory response, almost exclusively due to severe bacterial sepsis or septic shock. (NOTE)       Sepsis PCT Algorithm           Lower Respiratory Tract                                      Infection PCT Algorithm    ----------------------------     ----------------------------         PCT < 0.25 ng/mL                PCT < 0.10 ng/mL          Strongly encourage             Strongly discourage   discontinuation of antibiotics    initiation of antibiotics    ----------------------------     -----------------------------       PCT 0.25 - 0.50 ng/mL            PCT 0.10 - 0.25 ng/mL               OR       >80% decrease in PCT            Discourage initiation of                                            antibiotics      Encourage discontinuation           of antibiotics    ----------------------------     -----------------------------         PCT >= 0.50 ng/mL              PCT 0.26 - 0.50 ng/mL                AND       <80% decrease in PCT             Encourage initiation of  antibiotics       Encourage continuation           of antibiotics    ----------------------------     -----------------------------        PCT >= 0.50 ng/mL                  PCT > 0.50 ng/mL               AND         increase in PCT                  Strongly encourage                                      initiation of antibiotics    Strongly encourage escalation           of antibiotics                                     -----------------------------                                           PCT <= 0.25 ng/mL                                                 OR                                        > 80% decrease in PCT                                      Discontinue / Do not initiate                                              antibiotics  Performed at [facility], [address] [place], [place] [address]   C-reactive protein     Status: Abnormal   Collection Time: 02/17/23 11:32 PM  Result Value Ref Range   CRP 10.5 (H) <1.0 mg/dL    Comment: Performed at [facility], [address] [place], [place] [address]  Glucose, capillary     Status: Abnormal   Collection Time: 02/18/23 12:01 AM  Result Value Ref Range   Glucose-Capillary 130 (H) 70 - 99 mg/dL    Comment: Glucose reference range applies only to samples taken after fasting for at least 8 hours.  Respiratory (~20 pathogens) panel by PCR     Status: None   Collection Time: 02/18/23 12:45 AM   Specimen: Nasopharyngeal Swab; Respiratory  Result Value Ref Range   Adenovirus NOT DETECTED NOT DETECTED   Coronavirus 229E NOT DETECTED NOT DETECTED    Comment: (NOTE) The Coronavirus on the Respiratory Panel, DOES NOT test for the  novel  Coronavirus (2019 nCoV)    Coronavirus HKU1 NOT DETECTED NOT DETECTED   Coronavirus NL63 NOT DETECTED NOT DETECTED   Coronavirus OC43 NOT DETECTED NOT DETECTED   Metapneumovirus NOT DETECTED NOT DETECTED   Rhinovirus / Enterovirus NOT DETECTED NOT DETECTED   Influenza A NOT DETECTED NOT DETECTED   Influenza B NOT DETECTED NOT DETECTED   Parainfluenza Virus 1 NOT DETECTED NOT DETECTED   Parainfluenza Virus 2 NOT DETECTED NOT DETECTED   Parainfluenza Virus 3 NOT DETECTED NOT DETECTED   Parainfluenza Virus 4 NOT DETECTED NOT DETECTED   Respiratory Syncytial Virus NOT DETECTED NOT DETECTED   Bordetella pertussis NOT DETECTED NOT DETECTED   Bordetella Parapertussis NOT DETECTED NOT DETECTED   Chlamydophila pneumoniae NOT DETECTED NOT DETECTED   Mycoplasma pneumoniae NOT DETECTED NOT DETECTED    Comment: Performed at [facility], [address] [place], [place] [address]  Blood gas, venous     Status: Abnormal   Collection Time: 02/18/23  1:54 AM  Result Value Ref Range    pH, Ven 7.46 (H) 7.25 - 7.43   pCO2, Ven 42 (L) 44 - 60 mmHg   pO2, Ven 38 32 - 45 mmHg   Bicarbonate 29.9 (H) 20.0 - 28.0 mmol/L   Acid-Base Excess 5.4 (H) 0.0 - 2.0 mmol/L   O2 Saturation 60.5 %   Patient temperature 37.0     Comment: Performed at [facility], [address], [place], [place] [address]  Culture, blood (Routine X 2) w Reflex to ID Panel     Status: None (Preliminary result)   Collection Time: 02/18/23  4:16 AM   Specimen: BLOOD RIGHT HAND  Result Value Ref Range   Specimen Description BLOOD RIGHT HAND    Special Requests      BOTTLES DRAWN AEROBIC AND ANAEROBIC Blood Culture adequate volume   Culture      NO GROWTH 1 DAY Performed at [facility], [address] [place], [place] [address]    Report Status PENDING   Magnesium     Status: None   Collection Time: 02/18/23  4:25 AM  Result Value Ref Range   Magnesium 1.7 1.7 - 2.4 mg/dL    Comment: Performed at [facility], [address], [place], [place] [address]  CBC with Differential/Platelet     Status: Abnormal   Collection Time: 02/18/23  4:25 AM  Result Value Ref Range   WBC 5.1 4.0 - 10.5 K/uL   RBC 3.55 (L) 4.22 - 5.81 MIL/uL   Hemoglobin 9.8 (L) 13.0 - 17.0 g/dL   HCT 62.1 (L) 30.8 - 65.7 %   MCV 83.9 80.0 - 100.0 fL   MCH 27.6 26.0 - 34.0 pg   MCHC 32.9 30.0 - 36.0 g/dL   RDW 84.6 (H) 96.2 - 95.2 %   Platelets 116 (L) 150 - 400 K/uL    Comment: REPEATED TO VERIFY   nRBC 4.4 (H) 0.0 - 0.2 %   Neutrophils Relative % 91 %   Neutro Abs 4.6 1.7 - 7.7 K/uL   Lymphocytes Relative 2 %   Lymphs Abs 0.1 (L) 0.7 - 4.0 K/uL   Monocytes Relative 3 %   Monocytes Absolute 0.2 0.1 - 1.0 K/uL   Eosinophils Relative 0 %   Eosinophils Absolute 0.0 0.0 - 0.5 K/uL   Basophils Relative 0 %   Basophils Absolute 0.0 0.0 - 0.1 K/uL   nRBC 14 (H) 0 /100 WBC  Myelocytes 2 %   Blasts 2 %   Abs Immature Granulocytes 0.10 (H) 0.00 - 0.07 K/uL   Burr Cells PRESENT     Comment: Performed at  [facility], [address] [place], [place] [address]  Comprehensive metabolic panel     Status: Abnormal   Collection Time: 02/18/23  4:25 AM  Result Value Ref Range   Sodium 132 (L) 135 - 145 mmol/L   Potassium 3.1 (L) 3.5 - 5.1 mmol/L   Chloride 93 (L) 98 - 111 mmol/L   CO2 26 22 - 32 mmol/L   Glucose, Bld 92 70 - 99 mg/dL    Comment: Glucose reference range applies only to samples taken after fasting for at least 8 hours.   BUN 38 (H) 8 - 23 mg/dL   Creatinine, Ser 6.04 (H) 0.61 - 1.24 mg/dL   Calcium 7.2 (L) 8.9 - 10.3 mg/dL   Total Protein 5.1 (L) 6.5 - 8.1 g/dL   Albumin <5.4 (L) 3.5 - 5.0 g/dL   AST 098 (H) 15 - 41 U/L   ALT 594 (H) 0 - 44 U/L   Alkaline Phosphatase 142 (H) 38 - 126 U/L   Total Bilirubin 1.0 0.3 - 1.2 mg/dL   GFR, Estimated 5 (L) >60 mL/min    Comment: (NOTE) Calculated using the CKD-EPI Creatinine Equation (2021)    Anion gap 13 5 - 15    Comment: Performed at [facility], [address], [place], [place] [address]  C-reactive protein     Status: Abnormal   Collection Time: 02/18/23  4:25 AM  Result Value Ref Range   CRP 12.3 (H) <1.0 mg/dL    Comment: Performed at [facility], [address], [place], [place] [address]  Procalcitonin     Status: None   Collection Time: 02/18/23  4:25 AM  Result Value Ref Range   Procalcitonin 132.63 ng/mL    Comment:        Interpretation: PCT >= 10 ng/mL: Important systemic inflammatory response, almost exclusively due to severe bacterial sepsis or septic shock. (NOTE)       Sepsis PCT Algorithm           Lower Respiratory Tract                                      Infection PCT Algorithm    ----------------------------     ----------------------------         PCT < 0.25 ng/mL                PCT < 0.10 ng/mL          Strongly encourage             Strongly discourage   discontinuation of antibiotics    initiation of antibiotics    ----------------------------      -----------------------------       PCT 0.25 - 0.50 ng/mL            PCT 0.10 - 0.25 ng/mL               OR       >80% decrease in PCT            Discourage initiation of  antibiotics      Encourage discontinuation           of antibiotics    ----------------------------     -----------------------------         PCT >= 0.50 ng/mL              PCT 0.26 - 0.50 ng/mL                AND       <80% decrease in PCT             Encourage initiation of                                             antibiotics       Encourage continuation           of antibiotics    ----------------------------     -----------------------------        PCT >= 0.50 ng/mL                  PCT > 0.50 ng/mL               AND         increase in PCT                  Strongly encourage                                      initiation of antibiotics    Strongly encourage escalation           of antibiotics                                     -----------------------------                                           PCT <= 0.25 ng/mL                                                 OR                                        > 80% decrease in PCT                                      Discontinue / Do not initiate                                             antibiotics  Performed at [facility], [address] [place], [place] [address]   Brain natriuretic peptide     Status: Abnormal   Collection  Time: 02/18/23  4:25 AM  Result Value Ref Range   B Natriuretic Peptide 1,871.8 (H) 0.0 - 100.0 pg/mL    Comment: Performed at [facility], [address] [place], [place] [address]  DIC Panel Tomorrow AM 0500     Status: Abnormal   Collection Time: 02/18/23  4:25 AM  Result Value Ref Range   Prothrombin Time 15.5 (H) 11.4 - 15.2 seconds   INR 1.2 0.8 - 1.2    Comment: (NOTE) INR goal varies based on device and disease states.    aPTT 54 (H) 24 - 36 seconds     Comment:        IF BASELINE aPTT IS ELEVATED, SUGGEST PATIENT RISK ASSESSMENT BE USED TO DETERMINE APPROPRIATE ANTICOAGULANT THERAPY.    Fibrinogen 492 (H) 210 - 475 mg/dL    Comment: (NOTE) Fibrinogen results may be underestimated in patients receiving thrombolytic therapy.    D-Dimer, Quant 10.99 (H) 0.00 - 0.50 ug/mL-FEU    Comment: (NOTE) At the manufacturer cut-off value of 0.5 g/mL FEU, this assay has a negative predictive value of 95-100%.This assay is intended for use in conjunction with a clinical pretest probability (PTP) assessment model to exclude pulmonary embolism (PE) and deep venous thrombosis (DVT) in outpatients suspected of PE or DVT. Results should be correlated with clinical presentation.    Platelets 126 (L) 150 - 400 K/uL   Smear Review NO SCHISTOCYTES SEEN     Comment: Performed at [facility], [address] [place], [place] [address]  Culture, blood (Routine X 2) w Reflex to ID Panel     Status: None (Preliminary result)   Collection Time: 02/18/23  4:41 AM   Specimen: BLOOD  Result Value Ref Range   Specimen Description BLOOD RIGHT THUMB    Special Requests      BOTTLES DRAWN AEROBIC AND ANAEROBIC Blood Culture adequate volume   Culture  Setup Time      Organism ID to follow GRAM POSITIVE COCCI YEAST ANAEROBIC BOTTLE ONLY CRITICAL RESULT CALLED TO, READ BACK BY AND VERIFIED WITH: J WYLAND,[email or web address]  02/19/23 MK    Culture      GRAM POSITIVE COCCI YEAST CULTURE REINCUBATED FOR BETTER GROWTH Performed at [facility], [address] [place], [place] [address]    Report Status PENDING   Blood Culture ID Panel (Reflexed)     Status: Abnormal   Collection Time: 02/18/23  4:41 AM  Result Value Ref Range   Enterococcus faecalis NOT DETECTED NOT DETECTED   Enterococcus Faecium NOT DETECTED NOT DETECTED   Listeria monocytogenes NOT DETECTED NOT DETECTED   Staphylococcus species DETECTED (A) NOT DETECTED    Comment: CRITICAL RESULT  CALLED TO, READ BACK BY AND VERIFIED WITH: J WYLAND,[email or web address]  02/19/23 MK    Staphylococcus aureus (BCID) NOT DETECTED NOT DETECTED   Staphylococcus epidermidis NOT DETECTED NOT DETECTED   Staphylococcus lugdunensis NOT DETECTED NOT DETECTED   Streptococcus species NOT DETECTED NOT DETECTED   Streptococcus agalactiae NOT DETECTED NOT DETECTED   Streptococcus pneumoniae NOT DETECTED NOT DETECTED   Streptococcus pyogenes NOT DETECTED NOT DETECTED   A.calcoaceticus-baumannii NOT DETECTED NOT DETECTED   Bacteroides fragilis NOT DETECTED NOT DETECTED   Enterobacterales NOT DETECTED NOT DETECTED   Enterobacter cloacae complex NOT DETECTED NOT DETECTED   Escherichia coli NOT DETECTED NOT DETECTED   Klebsiella aerogenes NOT DETECTED NOT DETECTED   Klebsiella oxytoca NOT DETECTED NOT DETECTED   Klebsiella pneumoniae NOT DETECTED NOT DETECTED  Proteus species NOT DETECTED NOT DETECTED   Salmonella species NOT DETECTED NOT DETECTED   Serratia marcescens NOT DETECTED NOT DETECTED   Haemophilus influenzae NOT DETECTED NOT DETECTED   Neisseria meningitidis NOT DETECTED NOT DETECTED   Pseudomonas aeruginosa NOT DETECTED NOT DETECTED   Stenotrophomonas maltophilia NOT DETECTED NOT DETECTED   Candida albicans NOT DETECTED NOT DETECTED   Candida auris NOT DETECTED NOT DETECTED   Candida glabrata DETECTED (A) NOT DETECTED    Comment: CRITICAL RESULT CALLED TO, READ BACK BY AND VERIFIED WITH: J WYLAND,[email or web address]  02/19/23 MK    Candida krusei NOT DETECTED NOT DETECTED   Candida parapsilosis NOT DETECTED NOT DETECTED   Candida tropicalis NOT DETECTED NOT DETECTED   Cryptococcus neoformans/gattii NOT DETECTED NOT DETECTED    Comment: Performed at [facility], [address], [place], [place] [address]  Glucose, capillary     Status: None   Collection Time: 02/18/23  5:33 AM  Result Value Ref Range   Glucose-Capillary 89 70 - 99 mg/dL    Comment: Glucose reference range applies only to  samples taken after fasting for at least 8 hours.  Glucose, capillary     Status: None   Collection Time: 02/18/23  7:57 AM  Result Value Ref Range   Glucose-Capillary 74 70 - 99 mg/dL    Comment: Glucose reference range applies only to samples taken after fasting for at least 8 hours.  Glucose, capillary     Status: None   Collection Time: 02/18/23  8:24 AM  Result Value Ref Range   Glucose-Capillary 71 70 - 99 mg/dL    Comment: Glucose reference range applies only to samples taken after fasting for at least 8 hours.  Glucose, capillary     Status: None   Collection Time: 02/18/23 10:51 AM  Result Value Ref Range   Glucose-Capillary 80 70 - 99 mg/dL    Comment: Glucose reference range applies only to samples taken after fasting for at least 8 hours.  Glucose, capillary     Status: Abnormal   Collection Time: 02/18/23  4:51 PM  Result Value Ref Range   Glucose-Capillary 43 (LL) 70 - 99 mg/dL    Comment: Glucose reference range applies only to samples taken after fasting for at least 8 hours.  Glucose, capillary     Status: Abnormal   Collection Time: 02/18/23  5:17 PM  Result Value Ref Range   Glucose-Capillary 109 (H) 70 - 99 mg/dL    Comment: Glucose reference range applies only to samples taken after fasting for at least 8 hours.  Body fluid cell count with differential     Status: Abnormal   Collection Time: 02/18/23  6:00 PM  Result Value Ref Range   Fluid Type-FCT PERITONEAL FLUID NO CYTO     Comment: CORRECTED ON 08/04 AT 1812: PREVIOUSLY REPORTED AS Peritoneal   Color, Fluid COLORLESS (A) YELLOW   Appearance, Fluid CLEAR (A) CLEAR   Total Nucleated Cell Count, Fluid 135 0 - 1,000 cu mm   Neutrophil Count, Fluid 46 (H) 0 - 25 %   Lymphs, Fluid 49 %   Monocyte-Macrophage-Serous Fluid 5 (L) 50 - 90 %   Eos, Fluid 0 %   Other Cells, Fluid OTHER CELLS IDENTIFIED AS MESOTHELIAL CELLS %    Comment: Performed at [facility], [address] [place], [place] [address]   Glucose, capillary     Status: Abnormal   Collection Time: 02/18/23  7:35 PM  Result Value  Ref Range   Glucose-Capillary 132 (H) 70 - 99 mg/dL    Comment: Glucose reference range applies only to samples taken after fasting for at least 8 hours.  Glucose, capillary     Status: None   Collection Time: 02/18/23 11:39 PM  Result Value Ref Range   Glucose-Capillary 96 70 - 99 mg/dL    Comment: Glucose reference range applies only to samples taken after fasting for at least 8 hours.  Glucose, capillary     Status: None   Collection Time: 02/19/23  3:32 AM  Result Value Ref Range   Glucose-Capillary 96 70 - 99 mg/dL    Comment: Glucose reference range applies only to samples taken after fasting for at least 8 hours.  Magnesium     Status: None   Collection Time: 02/19/23  4:58 AM  Result Value Ref Range   Magnesium 1.8 1.7 - 2.4 mg/dL    Comment: Performed at [facility], [address], [place], [place] [address]  CBC with Differential/Platelet     Status: Abnormal   Collection Time: 02/19/23  4:58 AM  Result Value Ref Range   WBC 5.0 4.0 - 10.5 K/uL   RBC 3.45 (L) 4.22 - 5.81 MIL/uL   Hemoglobin 9.4 (L) 13.0 - 17.0 g/dL   HCT 60.4 (L) 54.0 - 98.1 %   MCV 83.8 80.0 - 100.0 fL   MCH 27.2 26.0 - 34.0 pg   MCHC 32.5 30.0 - 36.0 g/dL   RDW 19.1 (H) 47.8 - 29.5 %   Platelets 104 (L) 150 - 400 K/uL   nRBC 1.2 (H) 0.0 - 0.2 %   Neutrophils Relative % 95 %   Neutro Abs 4.7 1.7 - 7.7 K/uL   Lymphocytes Relative 2 %   Lymphs Abs 0.1 (L) 0.7 - 4.0 K/uL   Monocytes Relative 2 %   Monocytes Absolute 0.1 0.1 - 1.0 K/uL   Eosinophils Relative 0 %   Eosinophils Absolute 0.0 0.0 - 0.5 K/uL   Basophils Relative 0 %   Basophils Absolute 0.0 0.0 - 0.1 K/uL   WBC Morphology MORPHOLOGY UNREMARKABLE    RBC Morphology See Note    Smear Review Normal platelet morphology    Immature Granulocytes 1 %   Abs Immature Granulocytes 0.06 0.00 - 0.07 K/uL   Polychromasia PRESENT    Ovalocytes  PRESENT     Comment: Performed at [facility], [address] [place], [place] [address]  Comprehensive metabolic panel     Status: Abnormal   Collection Time: 02/19/23  4:58 AM  Result Value Ref Range   Sodium 135 135 - 145 mmol/L   Potassium 3.4 (L) 3.5 - 5.1 mmol/L   Chloride 94 (L) 98 - 111 mmol/L   CO2 29 22 - 32 mmol/L   Glucose, Bld 109 (H) 70 - 99 mg/dL    Comment: Glucose reference range applies only to samples taken after fasting for at least 8 hours.   BUN 55 (H) 8 - 23 mg/dL   Creatinine, Ser 86.57 (H) 0.61 - 1.24 mg/dL   Calcium 7.8 (L) 8.9 - 10.3 mg/dL   Total Protein 5.0 (L) 6.5 - 8.1 g/dL   Albumin <8.4 (L) 3.5 - 5.0 g/dL   AST 696 (H) 15 - 41 U/L   ALT 359 (H) 0 - 44 U/L   Alkaline Phosphatase 99 38 - 126 U/L   Total Bilirubin 1.2 0.3 - 1.2 mg/dL   GFR, Estimated 4 (L) >  60 mL/min    Comment: (NOTE) Calculated using the CKD-EPI Creatinine Equation (2021)    Anion gap 12 5 - 15    Comment: Performed at [facility], [address], [place], [place] [address]  C-reactive protein     Status: Abnormal   Collection Time: 02/19/23  4:58 AM  Result Value Ref Range   CRP 22.1 (H) <1.0 mg/dL    Comment: Performed at [facility], [address], [place], [place] [address]  Procalcitonin     Status: None   Collection Time: 02/19/23  4:58 AM  Result Value Ref Range   Procalcitonin >150.00 ng/mL    Comment:        Interpretation: PCT >= 10 ng/mL: Important systemic inflammatory response, almost exclusively due to severe bacterial sepsis or septic shock. (NOTE)       Sepsis PCT Algorithm           Lower Respiratory Tract                                      Infection PCT Algorithm    ----------------------------     ----------------------------         PCT < 0.25 ng/mL                PCT < 0.10 ng/mL          Strongly encourage             Strongly discourage   discontinuation of antibiotics    initiation of antibiotics     ----------------------------     -----------------------------       PCT 0.25 - 0.50 ng/mL            PCT 0.10 - 0.25 ng/mL               OR       >80% decrease in PCT            Discourage initiation of                                            antibiotics      Encourage discontinuation           of antibiotics    ----------------------------     -----------------------------         PCT >= 0.50 ng/mL              PCT 0.26 - 0.50 ng/mL                AND       <80% decrease in PCT             Encourage initiation of                                             antibiotics       Encourage continuation           of antibiotics    ----------------------------     -----------------------------        PCT >= 0.50 ng/mL  PCT > 0.50 ng/mL               AND         increase in PCT                  Strongly encourage                                      initiation of antibiotics    Strongly encourage escalation           of antibiotics                                     -----------------------------                                           PCT <= 0.25 ng/mL                                                 OR                                        > 80% decrease in PCT                                      Discontinue / Do not initiate                                             antibiotics  Performed at [facility], [address] [place], [place] [address]   Vancomycin, random     Status: None   Collection Time: 02/19/23  4:58 AM  Result Value Ref Range   Vancomycin Rm 26 ug/mL    Comment:        Random Vancomycin therapeutic range is dependent on dosage and time of specimen collection. A peak range is 20-40 ug/mL A trough range is 5-15 ug/mL        Performed at [facility], [address], [place], [place] [address]   Brain natriuretic peptide     Status: Abnormal   Collection Time: 02/19/23  4:59 AM  Result Value Ref Range   B Natriuretic  Peptide 2,260.0 (H) 0.0 - 100.0 pg/mL    Comment: Performed at [facility], [address], [place], [place] [address]  Glucose, capillary     Status: Abnormal   Collection Time: 02/19/23  7:32 AM  Result Value Ref Range   Glucose-Capillary 111 (H) 70 - 99 mg/dL    Comment: Glucose reference range applies only to samples taken after fasting for at least 8 hours.  Glucose, capillary     Status: Abnormal   Collection Time: 02/19/23 11:15 AM  Result Value Ref Range   Glucose-Capillary 121 (H) 70 -  99 mg/dL    Comment: Glucose reference range applies only to samples taken after fasting for at least 8 hours.    MICRO:  IMAGING: DG Chest Port 1 View  Result Date: 02/19/2023 CLINICAL DATA:  [address] Respiratory failure (HCC) [phone] EXAM: PORTABLE CHEST 1 VIEW COMPARISON:  02/17/2023 FINDINGS: Right IJ approach central venous catheter terminates at the level of the right atrium. Stable heart size status post sternotomy and CABG. Slightly low lung volumes. Persistent bibasilar opacities, similar to slightly progressed from prior. No pleural effusion or pneumothorax. IMPRESSION: Persistent bibasilar opacities, which may represent atelectasis or pneumonia. Electronically Signed   By: Duanne Guess D.O.   On: 02/19/2023 08:30   VAS [place] LOWER EXTREMITY VENOUS (DVT)  Result Date: 02/18/2023  Lower Venous DVT Study Patient Name:  [name] [name] [facility]  Date of Exam:   02/18/2023 Medical Rec #: [id number]       Accession #:    [id number] Date of Birth: Apr 01, 1951      Patient Gender: M Patient Age:   32 years Exam Location:  [facility] Procedure:      VAS [place] LOWER EXTREMITY VENOUS (DVT) Referring Phys: MURALI RAMASWAMY --------------------------------------------------------------------------------  Indications: Edema.  Comparison Study: No recent prior studies. Performing Technologist: Jean Rosenthal RDMS, RVT  Examination Guidelines: A complete evaluation includes B-mode imaging, spectral Doppler, color  Doppler, and power Doppler as needed of all accessible portions of each vessel. Bilateral testing is considered an integral part of a complete examination. Limited examinations for reoccurring indications may be performed as noted. The reflux portion of the exam is performed with the patient in reverse Trendelenburg.  +---------+---------------+---------+-----------+----------+--------------+ RIGHT    CompressibilityPhasicitySpontaneityPropertiesThrombus Aging +---------+---------------+---------+-----------+----------+--------------+ CFV      Full           Yes      Yes                                 +---------+---------------+---------+-----------+----------+--------------+ SFJ      Full                                                        +---------+---------------+---------+-----------+----------+--------------+ FV Prox  Full                                                        +---------+---------------+---------+-----------+----------+--------------+ FV Mid   Full                                                        +---------+---------------+---------+-----------+----------+--------------+ FV DistalFull                                                        +---------+---------------+---------+-----------+----------+--------------+ PFV      Full                                                        +---------+---------------+---------+-----------+----------+--------------+  POP      Full           Yes      Yes                                 +---------+---------------+---------+-----------+----------+--------------+ PTV      Full                                                        +---------+---------------+---------+-----------+----------+--------------+ PERO     Full                                                        +---------+---------------+---------+-----------+----------+--------------+    +---------+---------------+---------+-----------+----------+--------------+ LEFT     CompressibilityPhasicitySpontaneityPropertiesThrombus Aging +---------+---------------+---------+-----------+----------+--------------+ CFV      Full           Yes      Yes                                 +---------+---------------+---------+-----------+----------+--------------+ SFJ      Full                                                        +---------+---------------+---------+-----------+----------+--------------+ FV Prox  Full                                                        +---------+---------------+---------+-----------+----------+--------------+ FV Mid   Full                                                        +---------+---------------+---------+-----------+----------+--------------+ FV DistalFull                                                        +---------+---------------+---------+-----------+----------+--------------+ PFV      Full                                                        +---------+---------------+---------+-----------+----------+--------------+ POP      Full           Yes      Yes                                 +---------+---------------+---------+-----------+----------+--------------+  PTV      Full                                                        +---------+---------------+---------+-----------+----------+--------------+ PERO     Full                                                        +---------+---------------+---------+-----------+----------+--------------+     Summary: RIGHT: - There is no evidence of deep vein thrombosis in the lower extremity.  - No cystic structure found in the popliteal fossa.  LEFT: - There is no evidence of deep vein thrombosis in the lower extremity.  - No cystic structure found in the popliteal fossa.  *See table(s) above for measurements and observations. Electronically signed  by Heath Lark on 02/18/2023 at 5:05:26 PM.    Final    EEG adult  Result Date: 02/18/2023 Windell Norfolk, MD     02/18/2023  9:49 AM Patient Name: [name] [name] MRN: [id number] Epilepsy Attending: Windell Norfolk Referring Physician/Provider: Dr. Delton Coombes Date: 02/18/2023 Duration: 23 minutes Patient history: 34 year man with sepsis and AMS,  EEG to evaluate for seizure Level of alertness: lethargic AEDs during EEG study: None Technical aspects: This EEG study was done with scalp electrodes positioned according to the 10-20 International system of electrode placement. Electrical activity was reviewed with band pass filter of 1-70Hz , sensitivity of 7 uV/mm, display speed of 50mm/sec with a 60Hz  notched filter applied as appropriate. EEG data were recorded continuously and digitally stored.  Video monitoring was available and reviewed as appropriate. Description: EEG showed continuous/intermittent generalized polymorphic sharply contoured 3 to 6 Hz theta-delta slowing. Sleep was not seen. Hyperventilation and photic stimulation were not performed. No seizure or epileptiform discharges seen during this recording. ABNORMALITY - Continuous slow, generalized IMPRESSION: This study is suggestive of mild to moderate diffuse encephalopathy, nonspecific etiology but likely related to sedation, toxic-metabolic etiology. Amadou Camara   DG CHEST PORT 1 VIEW  Result Date: 02/17/2023 CLINICAL DATA:  Respiratory distress, shaking. EXAM: PORTABLE CHEST 1 VIEW COMPARISON:  02/16/2023. FINDINGS: The heart size and mediastinal contours are stable. There is atherosclerotic calcification of the aorta. Lung volumes are low and mild airspace disease is noted in the mid to lower lung fields bilaterally. No effusion or pneumothorax. A right internal jugular central venous catheter terminates over the right atrium. Sternotomy wires are present over the midline. IMPRESSION: 1. Mild airspace opacities in the mid to lower lung fields, possible  multifocal pneumonia. 2. Right internal jugular central venous catheter terminating in the right atrium. Electronically Signed   By: Thornell Sartorius M.D.   On: 02/17/2023 23:56     Assessment/Plan:  72yo M with ESRD on PD admitted for sepsis and subsequently found to have candidemia with culture negative peritonitis -  Agree with plan to remove PD catheter  - continue with current broad spectrum for peritonitis - micafungin for candidemia - recommend repeat blood cx after 2-3 doses of micafungin (and removal of PD catheter) - will plan to get TTE but likely need TEE if TTE is unrevealing - appears to be transient fungemia/bacteremia since it was not  identified on initial cultures on 02/15/2023.   Duke Salvia Drue Second MD MPH [facility] [phone]

## 2023-02-19 NOTE — Anesthesia Procedure Notes (Signed)
Procedure Name: Intubation Date/Time: 02/19/2023 3:40 PM  Performed by: Mariann Barter, MDPre-anesthesia Checklist: Patient identified, Emergency Drugs available, Suction available and Patient being monitored Patient Re-evaluated:Patient Re-evaluated prior to induction Oxygen Delivery Method: Circle System Utilized Preoxygenation: Pre-oxygenation with 100% oxygen Induction Type: IV induction Ventilation: Mask ventilation without difficulty Laryngoscope Size: Miller and 2 Grade View: Grade I Tube type: Oral Number of attempts: 1 Airway Equipment and Method: Stylet and Oral airway Placement Confirmation: ETT inserted through vocal cords under direct vision, positive ETCO2 and breath sounds checked- equal and bilateral Tube secured with: Tape Dental Injury: Teeth and Oropharynx as per pre-operative assessment

## 2023-02-19 NOTE — Anesthesia Postprocedure Evaluation (Signed)
Anesthesia Post Note  Patient: [name] [name]  Procedure(s) Performed: REMOVAL OF PERITONEAL DIALYSIS CATHETER (Abdomen)     Patient location during evaluation: PACU Anesthesia Type: General Level of consciousness: awake and alert Pain management: pain level controlled Vital Signs Assessment: post-procedure vital signs reviewed and stable Respiratory status: spontaneous breathing, nonlabored ventilation, respiratory function stable and patient connected to nasal cannula oxygen Cardiovascular status: blood pressure returned to baseline and stable Postop Assessment: no apparent nausea or vomiting Anesthetic complications: no   No notable events documented.  Last Vitals:  Vitals:   02/19/23 1116 02/19/23 1328  BP:    Pulse:    Resp:    Temp: (!) 36.4 C   SpO2:  93%    Last Pain:  Vitals:   02/19/23 1116  TempSrc: Oral  PainSc:                  Mariann Barter

## 2023-02-19 NOTE — Progress Notes (Signed)
Echocardiogram 2D Echocardiogram has been performed.  Irish Lack, RDCS 02/19/2023, 4:34 PM

## 2023-02-19 NOTE — Progress Notes (Signed)
PHARMACY - PHYSICIAN COMMUNICATION CRITICAL VALUE ALERT - BLOOD CULTURE IDENTIFICATION (BCID)  [name] [name] is an 72 y.o. male who presented to [facility] on 02/14/2023 with a chief complaint of sepsis due to peritonitis. 1/4 Bcx growing staph spp with no gene resistance and candida glabrata in the anaerobic bottle.  Name of physician (or Provider) Contacted: Dr. Cloyd Stagers  Current antibiotics: zosyn 2.25g IV every 8 hours and vancomycin per pharmacy  Changes to prescribed antibiotics recommended:   -Recommend adding micafungin 100 mg IV once daily (accepted)   Results for orders placed or performed during the hospital encounter of 02/14/23  Blood Culture ID Panel (Reflexed) (Collected: 02/18/2023  4:41 AM)  Result Value Ref Range   Enterococcus faecalis NOT DETECTED NOT DETECTED   Enterococcus Faecium NOT DETECTED NOT DETECTED   Listeria monocytogenes NOT DETECTED NOT DETECTED   Staphylococcus species DETECTED (A) NOT DETECTED   Staphylococcus aureus (BCID) NOT DETECTED NOT DETECTED   Staphylococcus epidermidis NOT DETECTED NOT DETECTED   Staphylococcus lugdunensis NOT DETECTED NOT DETECTED   Streptococcus species NOT DETECTED NOT DETECTED   Streptococcus agalactiae NOT DETECTED NOT DETECTED   Streptococcus pneumoniae NOT DETECTED NOT DETECTED   Streptococcus pyogenes NOT DETECTED NOT DETECTED   A.calcoaceticus-baumannii NOT DETECTED NOT DETECTED   Bacteroides fragilis NOT DETECTED NOT DETECTED   Enterobacterales NOT DETECTED NOT DETECTED   Enterobacter cloacae complex NOT DETECTED NOT DETECTED   Escherichia coli NOT DETECTED NOT DETECTED   Klebsiella aerogenes NOT DETECTED NOT DETECTED   Klebsiella oxytoca NOT DETECTED NOT DETECTED   Klebsiella pneumoniae NOT DETECTED NOT DETECTED   Proteus species NOT DETECTED NOT DETECTED   Salmonella species NOT DETECTED NOT DETECTED   Serratia marcescens NOT DETECTED NOT DETECTED   Haemophilus influenzae NOT DETECTED NOT DETECTED   Neisseria  meningitidis NOT DETECTED NOT DETECTED   Pseudomonas aeruginosa NOT DETECTED NOT DETECTED   Stenotrophomonas maltophilia NOT DETECTED NOT DETECTED   Candida albicans NOT DETECTED NOT DETECTED   Candida auris NOT DETECTED NOT DETECTED   Candida glabrata DETECTED (A) NOT DETECTED   Candida krusei NOT DETECTED NOT DETECTED   Candida parapsilosis NOT DETECTED NOT DETECTED   Candida tropicalis NOT DETECTED NOT DETECTED   Cryptococcus neoformans/gattii NOT DETECTED NOT DETECTED    [name], PharmD. Clinical Pharmacist 02/19/2023 1:06 AM

## 2023-02-19 NOTE — Consult Note (Signed)
NAME:  [name] [name], MRN:  [id number], DOB:  [birth year], LOS: 4 ADMISSION DATE:  02/14/2023, CONSULTATION DATE:  02/18/23 REFERRING MD:  Reggie Pile [facility], CHIEF COMPLAINT:  SIRS, sepsis, encephalopathy resp distress    History of Present Illness:   72 year old BMI 35.5 obese male with a history of bypass in 2023, COPD not otherwise specified, type 2 diabetes mellitus, acid reflux, ESRD on peritoneal dialysis, hypotension on midodrine.  Sometime mid June 2024 had COVID-19 and treated with Augmentin.  Subsequent to that had cough and poor oral intake and lethargy.  Despite t his mid juoy 2-24 - 1000 feeet in 5 min on room air without desaturation.   Presented to [facility] on 02/14/2023 for confusion and worsening shortness of breath and hypersomnolence.  At the time of admission is requiring 2 L nasal cannula and he had transaminitis.  Chest x-ray suggested pneumonia and a procalcitonin over 150 with a fever of 102.  Started on broad antibiotics and transferred to [facility].  Working diagnosis was peritonitis from his peritoneal dialysis catheter based on cell count 2200 and hazy fluid in the peritoneal dialysate  Subsequent course - 02/16/2023 felt to be responding to antibiotics more awake.  Nursing noticed him to be ambulatory to the bathroom.  Home Xanax and Neurontin were being held.  For his symptomatic transaminitis he had right upper quadrant ultrasound that suggested cirrhosis.  He did have peritoneal dialysis on 02/16/2023  -02/17/2023: Malfunction of peritoneal dialysis catheter with complaints of abdominal pain.  Has subsequent status post temporary HD catheter right internal jugular vein.  He then underwent emergent dialysis and returned back to the unit at around 10 PM 02/17/2023.  Nursing noticed marked difference.  He was less interactive.  He was alert and having a gaze but not following commands.  He would mumble a word.  His temperature was  101.5.  Tachypneic having tremors oxygen needs now at 5 L [.  Hemodialysis was at 4 L] and saturating 100% status post -1.2 L on hemodialysis.  Chest x-ray done showed worsening pulm infiltrates particularly lower lobes compared to admission.  Critical care medicine consulted.  By hospitalist.   Significant Hospital Events:  02/14/2023 - admit - 02/16/2023 felt to be responding to antibiotics more awake.  Nursing noticed him to be ambulatory to the bathroom.  Home Xanax and Neurontin were being held.  For his symptomatic transaminitis he had right upper quadrant ultrasound that suggested cirrhosis.  He did have peritoneal dialysis on 02/16/2023  -02/17/2023: Malfunction of peritoneal dialysis catheter with complaints of abdominal pain.  Has subsequent status post temporary HD catheter right internal jugular vein.  He then underwent emergent dialysis and returned back to the unit at around 10 PM 02/17/2023.  Nursing noticed marked difference.  He was less interactive.  He was alert and having a gaze but not following commands.  He would mumble a word.  His temperature was 101.5.  Tachypneic having tremors oxygen needs now at 5 L [.  Hemodialysis was at 4 L] and saturating 100% status post -1.2 L on hemodialysis.  Chest x-ray done showed worsening pulm infiltrates particularly lower lobes compared to admission.  Critical care medicine consulted.  By hospitalis  02/18/23: early hours - CCM consult  8/5 blood cultures >> Candida glabrata, staph species  Interim History / Subjective:   Significantly improved mental status, no tremor Hemodynamically stable 4 L nasal cannula Did not get PD overnight last  night Peritoneal cell count WBC 135 <<2200 (4 days ago)  Objective   Blood pressure 98/77, pulse 92, temperature (!) 97.5 F (36.4 C), temperature source Oral, resp. rate (!) 22, height 5\' 6"  (1.676 m), weight 99.7 kg, SpO2 (!) 89%.    FiO2 (%):  [30 %] 30 %   Intake/Output Summary (Last 24 hours) at  02/19/2023 8119 Last data filed at 02/19/2023 0830 Gross per 24 hour  Intake 1729.4 ml  Output 800 ml  Net 929.4 ml   Filed Weights   02/16/23 2023 02/17/23 0500 02/19/23 0500  Weight: 97.7 kg 99.6 kg 99.7 kg    Examination: General: Obese man, laying in bed, comfortable, HENT: Pupils equal, oropharynx clear, no secretions, strong voice Lungs: Decreased at both bases but more comfortable respiratory pattern, no crackles or wheezes Cardiovascular: Irregularly irregular, no murmur Abdomen: Obese and somewhat protuberant, generalized diffuse tenderness but no rebound or guarding.  PD catheter is in place Extremities: 1+ lower extremity edema Neuro: Awake, alert, interacting appropriately, better oriented and able to answer questions.  Follows commands.  Very slight right upper extremity tremor (back to his baseline).  Globally weak but able to lift arms and legs GU: Deferred  Resolved Hospital Problem list   x  Assessment & Plan:   Sepsis.  Cell count consistent with peritonitis from his PD and PD catheter, fluid culture no growth to date.  Blood cultures now with Candida glabrata, also staph species of unclear significance (question contaminant).  Pneumonia/HCAP was considered as possible source at presentation but no corresponding clinical symptoms, unlikely -Micafungin added 8/5 -DC vancomycin, Zosyn, change to ceftriaxone on 8/5 -Discussed with nephrology, will need to have his PD catheter removed.  Could consider replacing a new 1 after he recovers from this illness -His right IJ HD catheter was placed prior to culture results, prior to initiation of antifungals.  Suspect he will need a line holiday on therapy -Will need echocardiogram, check TTE.  May possibly require TEE  Acute metabolic encephalopathy with confusion at admission 7/3 and 8/1 and subsequent improvement with treatment of peritonitis.  Recurrent confusion again 02/17/2023 after hemodialysis.  Suspect continued septic  encephalopathy, possibly complicated by volume shifts from HD.  Again improved 8/4-5 -Continue supportive care, treatment of underlying sepsis, fungemia -Minimize sedating medications.  Home gabapentin, Xanax are on hold -Dialysis as per nephrology plans  Acute hypoxemic respiratory failure with bibasilar infiltrates on CT and chest x-ray.  Consider volume overload, acute lung injury due to sepsis, possibly also pneumonia although no clinical history of cough, sputum, etc. Encephalopathy and airway protection also a factor.  Significantly improved, room air -Follow intermittent chest x-ray -Do not feel that we need to continue broad antibiotics for HCAP given lower suspicion that this was septic source on presentation -Pulmonary hygiene -Okay to stop BiPAP  End-stage renal disease on peritoneal dialysis in [place] Insertion of new hemodialysis catheter 8/3 and status post first dialysis 8/3 -Believe that his PD catheter will need to be removed.  He will have to have HD at least temporarily.  As above will likely require a line exchange/line holiday given the fungemia and possible bacteremia -Follow BMP -Appreciate nephrology management  Tremor, significantly worse during critical illness, now improved -Following  Transaminitis, suspect shock liver -Follow LFT, follow coag   Best practice (daily eval):  Diet: Renal diet Pain/Anxiety/Delirium protocol (if indicated): x VAP protocol (if indicated): x DVT prophylaxis: heparin sq GI prophylaxis:  PPI Glucose control: SSI + semglee Mobility: bed  rest Code Status: full code Family Communication: status and plans reviewed with pt's wife 8/4 Disposition: Okay to transition to progressive on 8/5   SIGNATURE   Levy Pupa, MD, PhD 02/19/2023, 9:07 AM [facility] [phone] or if no answer before 7:00PM call [phone] For any issues after 7:00PM please call eLink [phone]   LABS     PULMONARY Recent Labs  Lab 02/15/23 0827 02/18/23 0154  HCO3 27.6 29.9*  O2SAT 36.8 60.5    CBC Recent Labs  Lab 02/17/23 2332 02/18/23 0425 02/19/23 0458  HGB 10.3* 9.8* 9.4*  HCT 30.9* 29.8* 28.9*  WBC 5.4 5.1 5.0  PLT 125* 126*  116* 104*    COAGULATION Recent Labs  Lab 02/15/23 0827 02/16/23 0117 02/17/23 2332 02/18/23 0425  INR 1.2 1.3* 1.2 1.2    CARDIAC  No results for input(s): "TROPONINI" in the last 168 hours. No results for input(s): "PROBNP" in the last 168 hours.  CHEMISTRY Recent Labs  Lab 02/16/23 0117 02/17/23 0351 02/17/23 2332 02/18/23 0425 02/19/23 0458  NA 130* 131* 133* 132* 135  K 4.3 4.3 3.3* 3.1* 3.4*  CL 91* 90* 92* 93* 94*  CO2 19* 23 25 26 29   GLUCOSE 284* 210* 154* 92 109*  BUN 57* 69* 35* 38* 55*  CREATININE 14.71* 15.03* 8.36* 9.61* 11.66*  CALCIUM 7.2* 7.3* 7.3* 7.2* 7.8*  MG  --  1.8  --  1.7 1.8  PHOS 8.2*  --   --   --   --    Estimated Creatinine Clearance: 6.3 mL/min (A) (by C-G formula based on SCr of 11.66 mg/dL (H)).   LIVER Recent Labs  Lab 02/15/23 0827 02/16/23 0117 02/17/23 0351 02/17/23 2332 02/18/23 0425 02/19/23 0458  AST 4,152* 2,628* 1,252* 635* 520* 248*  ALT 2,053* 1,431* 1,018* 686* 594* 359*  ALKPHOS 111 99 127* 152* 142* 99  BILITOT 1.0 0.8 0.7 1.3* 1.0 1.2  PROT 5.9* 5.3* 5.2* 5.3* 5.1* 5.0*  ALBUMIN 1.9* <1.5* 1.6* 1.6* <1.5* <1.5*  INR 1.2 1.3*  --  1.2 1.2  --      INFECTIOUS Recent Labs  Lab 02/17/23 2332 02/18/23 0425 02/19/23 0458  LATICACIDVEN 1.6  --   --   PROCALCITON 130.47 132.63 >150.00     ENDOCRINE CBG (last 3)  Recent Labs    02/18/23 2339 02/19/23 0332 02/19/23 0732  GLUCAP 96 96 111*

## 2023-02-19 NOTE — Transfer of Care (Signed)
Immediate Anesthesia Transfer of Care Note  Patient: [name] [name]  Procedure(s) Performed: REMOVAL OF PERITONEAL DIALYSIS CATHETER (Abdomen)  Patient Location: PACU  Anesthesia Type:General  Level of Consciousness: awake and alert   Airway & Oxygen Therapy: Patient Spontanous Breathing and Patient connected to face mask oxygen  Post-op Assessment: Report given to RN and Post -op Vital signs reviewed and stable  Post vital signs: Reviewed and stable  Last Vitals:  Vitals Value Taken Time  BP    Temp    Pulse    Resp 0 02/19/23 1700  SpO2    Vitals shown include unfiled device data.  Last Pain:  Vitals:   02/19/23 1116  TempSrc: Oral  PainSc:          Complications: No notable events documented.

## 2023-02-20 ENCOUNTER — Encounter (HOSPITAL_COMMUNITY): Payer: Self-pay | Admitting: Vascular Surgery

## 2023-02-20 ENCOUNTER — Encounter (HOSPITAL_COMMUNITY): Payer: Medicare Other | Admitting: Occupational Therapy

## 2023-02-20 DIAGNOSIS — B49 Unspecified mycosis: Secondary | ICD-10-CM | POA: Diagnosis not present

## 2023-02-20 DIAGNOSIS — K659 Peritonitis, unspecified: Secondary | ICD-10-CM | POA: Diagnosis not present

## 2023-02-20 DIAGNOSIS — G9340 Encephalopathy, unspecified: Secondary | ICD-10-CM | POA: Diagnosis not present

## 2023-02-20 LAB — POCT I-STAT 7, (LYTES, BLD GAS, ICA,H+H)
Acid-Base Excess: 0 mmol/L (ref 0.0–2.0)
Bicarbonate: 23.7 mmol/L (ref 20.0–28.0)
Calcium, Ion: 1.01 mmol/L — ABNORMAL LOW (ref 1.15–1.40)
HCT: 28 % — ABNORMAL LOW (ref 39.0–52.0)
Hemoglobin: 9.5 g/dL — ABNORMAL LOW (ref 13.0–17.0)
O2 Saturation: 92 %
Potassium: 3.8 mmol/L (ref 3.5–5.1)
Sodium: 133 mmol/L — ABNORMAL LOW (ref 135–145)
TCO2: 25 mmol/L (ref 22–32)
pCO2 arterial: 35.7 mmHg (ref 32–48)
pH, Arterial: 7.43 (ref 7.35–7.45)
pO2, Arterial: 62 mmHg — ABNORMAL LOW (ref 83–108)

## 2023-02-20 LAB — GLUCOSE, CAPILLARY
Glucose-Capillary: 90 mg/dL (ref 70–99)
Glucose-Capillary: 107 mg/dL — ABNORMAL HIGH (ref 70–99)
Glucose-Capillary: 77 mg/dL (ref 70–99)

## 2023-02-20 LAB — HEPATITIS B SURFACE ANTIBODY, QUANTITATIVE: Hep B S AB Quant (Post): <3.5 m[IU]/mL — ABNORMAL LOW

## 2023-02-20 MED ORDER — SODIUM CHLORIDE 0.9 % IV SOLN: 3.0000 g | Freq: Two times a day (BID) | INTRAVENOUS | Status: DC

## 2023-02-20 MED ORDER — HEPARIN SODIUM (PORCINE) 1000 UNIT/ML DIALYSIS: 2000.0000 [IU] | INTRAMUSCULAR | Status: DC | PRN

## 2023-02-20 MED ORDER — FENTANYL CITRATE PF 50 MCG/ML IJ SOSY: 12.5000 ug | PREFILLED_SYRINGE | INTRAMUSCULAR | Status: DC | PRN

## 2023-02-20 MED ORDER — HEPARIN SODIUM (PORCINE) 1000 UNIT/ML IJ SOLN
2000.0000 [IU] | Freq: Once | INTRAMUSCULAR | Status: AC
  Administered 2023-02-20: 2000 [IU]
  Filled 2023-02-20: qty 2

## 2023-02-20 MED ORDER — FENTANYL CITRATE PF 50 MCG/ML IJ SOSY
12.5000 ug | PREFILLED_SYRINGE | INTRAMUSCULAR | Status: DC | PRN
  Administered 2023-02-20 – 2023-02-21 (×7): 12.5 ug via INTRAVENOUS
  Filled 2023-02-20 (×7): qty 1

## 2023-02-20 MED ORDER — POTASSIUM CHLORIDE 20 MEQ PO PACK: 20.0000 meq | PACK | Freq: Once | ORAL | Status: DC

## 2023-02-20 MED ORDER — POTASSIUM CHLORIDE 20 MEQ PO PACK
20.0000 meq | PACK | Freq: Two times a day (BID) | ORAL | Status: DC
  Administered 2023-02-20: 20 meq via ORAL
  Filled 2023-02-20: qty 1

## 2023-02-20 MED ORDER — HYDROMORPHONE HCL 2 MG PO TABS
1.0000 mg | ORAL_TABLET | ORAL | Status: DC | PRN
  Administered 2023-02-20: 1 mg via ORAL
  Filled 2023-02-20: qty 1

## 2023-02-20 MED ORDER — POTASSIUM CHLORIDE CRYS ER 20 MEQ PO TBCR
40.0000 meq | EXTENDED_RELEASE_TABLET | Freq: Once | ORAL | Status: DC
  Filled 2023-02-20: qty 2

## 2023-02-20 MED ORDER — INSULIN GLARGINE-YFGN 100 UNIT/ML ~~LOC~~ SOLN
10.0000 [IU] | Freq: Every day | SUBCUTANEOUS | Status: DC
  Filled 2023-02-20: qty 0.1

## 2023-02-20 MED ORDER — SODIUM CHLORIDE 0.9 % IV SOLN
3.0000 g | Freq: Once | INTRAVENOUS | Status: AC
  Administered 2023-02-20: 3 g via INTRAVENOUS
  Filled 2023-02-20: qty 8

## 2023-02-20 MED ORDER — ALPRAZOLAM 0.5 MG PO TABS
0.2500 mg | ORAL_TABLET | Freq: Once | ORAL | Status: AC | PRN
  Administered 2023-02-20: 0.25 mg via ORAL
  Filled 2023-02-20: qty 1

## 2023-02-20 MED ORDER — HEPARIN SODIUM (PORCINE) 1000 UNIT/ML DIALYSIS
2000.0000 [IU] | Freq: Once | INTRAMUSCULAR | Status: DC
  Filled 2023-02-20: qty 2

## 2023-02-20 MED ORDER — MAGNESIUM SULFATE 2 GM/50ML IV SOLN
2.0000 g | Freq: Once | INTRAVENOUS | Status: AC
  Administered 2023-02-20: 2 g via INTRAVENOUS
  Filled 2023-02-20: qty 50

## 2023-02-20 MED ORDER — SODIUM CHLORIDE 0.9 % IV SOLN
3.0000 g | Freq: Two times a day (BID) | INTRAVENOUS | Status: DC
  Administered 2023-02-21 – 2023-03-02 (×19): 3 g via INTRAVENOUS
  Filled 2023-02-20 (×19): qty 8

## 2023-02-20 NOTE — Progress Notes (Signed)
Subjective: went to OR yest for PD cath removal.  Seen in ICU this am. More lethargic today, but O x 3. No c/o's.   Objective Vital signs in last 24 hours: Vitals:   02/20/23 0500 02/20/23 0600 02/20/23 0735 02/20/23 0800  BP: 121/69 123/69  114/61  Pulse: 86 80  75  Resp: 19 20  (!) 24  Temp:   98.2 F (36.8 C)   TempSrc:   Oral   SpO2: 98% 100%  99%  Weight:      Height:       Weight change: 0.6 kg  Physical Exam: General: ill appearing , lethargic, Ox 3 Heart: RRR no MRG Lungs: no coughing, BL rhonchi, no rales Abdomen: soft, obese, PD cath has been removed Extremities: No pedal edema Dialysis Access: PD cath present  LUA AVF positive thrill but very small and tortuous temp HD cath RIJ in place Neuro: as above   Home med --> albuterol, xanax, aspirin, aotrvastatin, clopidogrel, esomeprazole, gabapentin, insulin glargine, linagliptin, midodrine   Outpatient PD orders:  Followed by [facility] (note his PD RN was on vacation  but nephrology called [place] location for orders  [facility] - davita [place] [facility] - 100kg 5 cycles, 3000 fill vol (note pt states on consult he thinks is 2.5 liters), total time: 10 hrs, last fill 1500 cc Mircera 150 mcg q monthly Venofer 100mg  once a month L AVF +thrill but small + tortuous    CXR 8/05 - IMPRESSION: Persistent bibasilar opacities, which may represent atelectasis or pneumonia.  Assessment/ Plan: Peritonitis PD catheter related peritonitis - initial cell count 2200, 93% PMNs. Pt initially rec'd IV cefepime, vanc and flagyl. PD fluid cx was negative and initial BCx's done 8/01 were negative.  Repeat blood cx's however done 8/04 were + for GPC and fungus (BCID showing Staph and C. Glabrata). ID consulting and abx changed to IV rocephin and micafungin. PD cath removed 8/05 due to fungemia. Per ccm / ID.  ESRD - PD not working 8/2 --> IR placed temp HD catheter  and underwent HD 8/3.  PD cath removed 8/05. Using temp  cath for HD but may need a line holiday at some point. HD today.  Metabolic encephalopathy - due to sepsis from peritonitis, intermittent Acute hypoxic resp failure - extubated, much better BP/volume - BP's a bit soft in 100s, at his dry wt, euvolemic on exam.  Transaminitis - low BP's possibly Anemia esrd - Hb 10.6 <12.2 no ESA needed currently monitor Secondary hyperparathyroidism -corrected calcium 10, no vitamin D, phosphorus 8.2, Renvela binder   Rob   MD  [facility] 02/20/2023, 10:17 AM  Recent Labs  Lab 02/16/23 0117 02/17/23 0351 02/18/23 0425 02/19/23 0458 02/20/23 0126 02/20/23 0538  HGB 10.6*   < > 9.8* 9.4* 9.5* 8.7*  ALBUMIN <1.5*   < > <1.5* <1.5*  --   --   CALCIUM 7.2*   < > 7.2* 7.8*  --  6.5*  PHOS 8.2*  --   --   --   --  5.7*  CREATININE 14.71*   < > 9.61* 11.66*  --  11.53*  K 4.3   < > 3.1* 3.4* 3.8 3.1*   < > = values in this interval not displayed.    Inpatient medications:  aspirin EC  81 mg Oral Daily   Chlorhexidine Gluconate Cloth  6 each Topical Q0600   clopidogrel  75 mg Oral Daily   [START ON 02/21/2023] heparin  2,000  Units Dialysis Once in dialysis   heparin  5,000 Units Subcutaneous Q8H   insulin aspart  0-5 Units Subcutaneous QHS   insulin aspart  0-6 Units Subcutaneous TID WC   insulin glargine-yfgn  15 Units Subcutaneous Daily   ipratropium-albuterol  3 mL Nebulization TID   midodrine  5 mg Oral TID WC   mometasone-formoterol  2 puff Inhalation BID   pantoprazole  40 mg Oral BID   sevelamer carbonate  2,400 mg Oral TID WC   sodium chloride flush  3 mL Intravenous Q12H    cefTRIAXone (ROCEPHIN)  IV Stopped (02/19/23 1404)   dextrose Stopped (02/19/23 1511)   micafungin (MYCAMINE) 100 mg in sodium chloride 0.9 % 100 mL IVPB Stopped (02/20/23 0332)   albuterol, [START ON 02/21/2023] heparin, heparin sodium (porcine) 3,000 Units in dialysis solution 2.5% low-MG/low-CA 6,000 mL dialysis solution, ondansetron (ZOFRAN)  IV         Labs: Basic Metabolic Panel: Recent Labs  Lab 02/16/23 0117 02/17/23 0351 02/18/23 0425 02/19/23 0458 02/20/23 0126 02/20/23 0538  NA 130*   < > 132* 135 133* 137  K 4.3   < > 3.1* 3.4* 3.8 3.1*  CL 91*   < > 93* 94*  --  99  CO2 19*   < > 26 29  --  23  GLUCOSE 284*   < > 92 109*  --  89  BUN 57*   < > 38* 55*  --  57*  CREATININE 14.71*   < > 9.61* 11.66*  --  11.53*  CALCIUM 7.2*   < > 7.2* 7.8*  --  6.5*  PHOS 8.2*  --   --   --   --  5.7*   < > = values in this interval not displayed.   Liver Function Tests: Recent Labs  Lab 02/17/23 2332 02/18/23 0425 02/19/23 0458  AST 635* 520* 248*  ALT 686* 594* 359*  ALKPHOS 152* 142* 99  BILITOT 1.3* 1.0 1.2  PROT 5.3* 5.1* 5.0*  ALBUMIN 1.6* <1.5* <1.5*   Recent Labs  Lab 02/15/23 0006  LIPASE 173*   Recent Labs  Lab 02/15/23 0827  AMMONIA 16   CBC: Recent Labs  Lab 02/17/23 0351 02/17/23 2332 02/18/23 0425 02/19/23 0458 02/20/23 0126 02/20/23 0538  WBC 6.7 5.4 5.1 5.0  --  6.6  NEUTROABS 5.7  --  4.6 4.7  --   --   HGB 10.4* 10.3* 9.8* 9.4* 9.5* 8.7*  HCT 32.3* 30.9* 29.8* 28.9* 28.0* 26.4*  MCV 83.9 81.5 83.9 83.8  --  83.5  PLT 139* 125* 126*  116* 104*  --  107*   Cardiac Enzymes: Recent Labs  Lab 02/15/23 1110  CKTOTAL 335   CBG: Recent Labs  Lab 02/19/23 1115 02/19/23 1731 02/19/23 1759 02/19/23 2123 02/20/23 0734  GLUCAP 121* 85 97 109* 90    Studies/Results: ECHOCARDIOGRAM COMPLETE  Result Date: 02/19/2023    ECHOCARDIOGRAM REPORT   Patient Name:   MCRAE DRACE Date of Exam: 02/19/2023 Medical Rec #:  [id number]      Height:       66.0 in Accession #:    [id number]     Weight:       219.8 lb Date of Birth:  [birth year]     BSA:          2.082 m Patient Age:    72 years       BP:  110/93 mmHg Patient Gender: M              HR:           74 bpm. Exam Location:  Inpatient Procedure: 2D Echo, Color Doppler, Cardiac Doppler and Intracardiac             Opacification Agent Indications:     Endocarditis  History:         Patient has prior history of Echocardiogram examinations, most                  recent 05/31/2022. Previous Myocardial Infarction and CAD,                  COPD, Arrythmias:NSTEMI; Risk Factors:Sleep Apnea, Diabetes and                  Hypertension.  Sonographer:     Raeford Razor Referring Phys:  Levy Pupa, S Diagnosing Phys: [facility]  Sonographer Comments: Technically difficult study due to poor echo windows. Image acquisition challenging due to patient body habitus. IMPRESSIONS  1. Akinesis of the anterior /anteroseptal/inferoseptal wall from base to apex. Left ventricular ejection fraction, by estimation, is 40 to 45%. The left ventricle has mildly decreased function. Left ventricular diastolic parameters are indeterminate.  2. Right ventricular systolic function is moderately reduced. The right ventricular size is normal.  3. Left atrial size was mildly dilated.  4. The mitral valve was not well visualized. No evidence of mitral valve regurgitation.  5. The aortic valve was not well visualized. Aortic valve regurgitation is not visualized. Mild aortic valve stenosis.  6. The inferior vena cava is normal in size with greater than 50% respiratory variability, suggesting right atrial pressure of 3 mmHg. Comparison(s): No significant change from prior study. FINDINGS  Left Ventricle: Akinesis of the anterior /anteroseptal/inferoseptal wall from base to apex. Left ventricular ejection fraction, by estimation, is 40 to 45%. The left ventricle has mildly decreased function. Definity contrast agent was given IV to delineate the left ventricular endocardial borders. The left ventricular internal cavity size was normal in size. There is no left ventricular hypertrophy. Left ventricular diastolic parameters are indeterminate. Right Ventricle: The right ventricular size is normal. Right ventricular systolic function is moderately reduced. Left Atrium:  Left atrial size was mildly dilated. Right Atrium: Right atrial size was normal in size. Pericardium: There is no evidence of pericardial effusion. Mitral Valve: The mitral valve was not well visualized. No evidence of mitral valve regurgitation. Tricuspid Valve: Tricuspid valve regurgitation is mild. Aortic Valve: The aortic valve was not well visualized. Aortic valve regurgitation is not visualized. Mild aortic stenosis is present. Aortic valve mean gradient measures 15.0 mmHg. Aortic valve peak gradient measures 27.2 mmHg. Aortic valve area, by VTI  measures 1.57 cm. Pulmonic Valve: Pulmonic valve regurgitation is not visualized. Aorta: The aortic root and ascending aorta are structurally normal, with no evidence of dilitation. Venous: The inferior vena cava is normal in size with greater than 50% respiratory variability, suggesting right atrial pressure of 3 mmHg. IAS/Shunts: No atrial level shunt detected by color flow Doppler.  LEFT VENTRICLE PLAX 2D LVIDd:         5.20 cm     Diastology LVIDs:         3.80 cm     LV e' medial:    10.70 cm/s LV PW:         1.00 cm     LV E/e' medial:  8.5  LV IVS:        1.00 cm     LV e' lateral:   8.49 cm/s LVOT diam:     2.00 cm     LV E/e' lateral: 10.7 LV SV:         68 LV SV Index:   33 LVOT Area:     3.14 cm  LV Volumes (MOD) LV vol d, MOD A2C: 95.4 ml LV vol d, MOD A4C: 49.6 ml LV vol s, MOD A2C: 47.0 ml LV vol s, MOD A4C: 24.7 ml LV SV MOD A2C:     48.4 ml LV SV MOD A4C:     49.6 ml LV SV MOD BP:      33.3 ml RIGHT VENTRICLE            IVC RV Basal diam:  3.30 cm    IVC diam: 1.20 cm RV S prime:     6.31 cm/s TAPSE (M-mode): 1.7 cm LEFT ATRIUM           Index        RIGHT ATRIUM           Index LA diam:      3.90 cm 1.87 cm/m   RA Area:     18.80 cm LA Vol (A4C): 82.5 ml 39.63 ml/m  RA Volume:   49.40 ml  23.73 ml/m  AORTIC VALVE AV Area (Vmax):    1.36 cm AV Area (Vmean):   1.26 cm AV Area (VTI):     1.57 cm AV Vmax:           261.00 cm/s AV Vmean:           181.000 cm/s AV VTI:            0.431 m AV Peak Grad:      27.2 mmHg AV Mean Grad:      15.0 mmHg LVOT Vmax:         113.00 cm/s LVOT Vmean:        72.600 cm/s LVOT VTI:          0.216 m LVOT/AV VTI ratio: 0.50  AORTA Ao Root diam: 3.00 cm Ao Asc diam:  3.10 cm MITRAL VALVE MV Area (PHT): 5.84 cm    SHUNTS MV Decel Time: 130 msec    Systemic VTI:  0.22 m MV E velocity: 91.20 cm/s  Systemic Diam: 2.00 cm Carolan Clines Electronically signed by Carolan Clines Signature Date/Time: 02/19/2023/6:44:33 PM    Final (Updated)    DG Chest Port 1 View  Result Date: 02/19/2023 CLINICAL DATA:  03474 Respiratory failure (HCC) 25956 EXAM: PORTABLE CHEST 1 VIEW COMPARISON:  02/17/2023 FINDINGS: Right IJ approach central venous catheter terminates at the level of the right atrium. Stable heart size status post sternotomy and CABG. Slightly low lung volumes. Persistent bibasilar opacities, similar to slightly progressed from prior. No pleural effusion or pneumothorax. IMPRESSION: Persistent bibasilar opacities, which may represent atelectasis or pneumonia. Electronically Signed   By: Duanne Guess D.O.   On: 02/19/2023 08:30   VAS [place] LOWER EXTREMITY VENOUS (DVT)  Result Date: 02/18/2023  Lower Venous DVT Study Patient Name:  MACALLAN PEREGRINO  Date of Exam:   02/18/2023 Medical Rec #: [id number]       Accession #:    [id number] Date of Birth: 1951/05/04      Patient Gender: M Patient Age:   20 years Exam Location:  [facility] Procedure:      VAS  [place] LOWER EXTREMITY VENOUS (DVT) Referring Phys: MURALI RAMASWAMY --------------------------------------------------------------------------------  Indications: Edema.  Comparison Study: No recent prior studies. Performing Technologist: Jean Rosenthal RDMS, RVT  Examination Guidelines: A complete evaluation includes B-mode imaging, spectral Doppler, color Doppler, and power Doppler as needed of all accessible portions of each vessel. Bilateral testing is considered an integral part of a  complete examination. Limited examinations for reoccurring indications may be performed as noted. The reflux portion of the exam is performed with the patient in reverse Trendelenburg.  +---------+---------------+---------+-----------+----------+--------------+ RIGHT    CompressibilityPhasicitySpontaneityPropertiesThrombus Aging +---------+---------------+---------+-----------+----------+--------------+ CFV      Full           Yes      Yes                                 +---------+---------------+---------+-----------+----------+--------------+ SFJ      Full                                                        +---------+---------------+---------+-----------+----------+--------------+ FV Prox  Full                                                        +---------+---------------+---------+-----------+----------+--------------+ FV Mid   Full                                                        +---------+---------------+---------+-----------+----------+--------------+ FV DistalFull                                                        +---------+---------------+---------+-----------+----------+--------------+ PFV      Full                                                        +---------+---------------+---------+-----------+----------+--------------+ POP      Full           Yes      Yes                                 +---------+---------------+---------+-----------+----------+--------------+ PTV      Full                                                        +---------+---------------+---------+-----------+----------+--------------+ PERO     Full                                                        +---------+---------------+---------+-----------+----------+--------------+   +---------+---------------+---------+-----------+----------+--------------+  LEFT     CompressibilityPhasicitySpontaneityPropertiesThrombus Aging  +---------+---------------+---------+-----------+----------+--------------+ CFV      Full           Yes      Yes                                 +---------+---------------+---------+-----------+----------+--------------+ SFJ      Full                                                        +---------+---------------+---------+-----------+----------+--------------+ FV Prox  Full                                                        +---------+---------------+---------+-----------+----------+--------------+ FV Mid   Full                                                        +---------+---------------+---------+-----------+----------+--------------+ FV DistalFull                                                        +---------+---------------+---------+-----------+----------+--------------+ PFV      Full                                                        +---------+---------------+---------+-----------+----------+--------------+ POP      Full           Yes      Yes                                 +---------+---------------+---------+-----------+----------+--------------+ PTV      Full                                                        +---------+---------------+---------+-----------+----------+--------------+ PERO     Full                                                        +---------+---------------+---------+-----------+----------+--------------+     Summary: RIGHT: - There is no evidence of deep vein thrombosis in the lower extremity.  - No cystic structure found in the popliteal fossa.  LEFT: - There is no evidence of deep vein thrombosis in the lower extremity.  - No  cystic structure found in the popliteal fossa.  *See table(s) above for measurements and observations. Electronically signed by Heath Lark on 02/18/2023 at 5:05:26 PM.    Final    Medications:  cefTRIAXone (ROCEPHIN)  IV Stopped (02/19/23 1404)   dextrose Stopped (02/19/23  1511)   micafungin (MYCAMINE) 100 mg in sodium chloride 0.9 % 100 mL IVPB Stopped (02/20/23 0332)    aspirin EC  81 mg Oral Daily   Chlorhexidine Gluconate Cloth  6 each Topical Q0600   clopidogrel  75 mg Oral Daily   [START ON 02/21/2023] heparin  2,000 Units Dialysis Once in dialysis   heparin  5,000 Units Subcutaneous Q8H   insulin aspart  0-5 Units Subcutaneous QHS   insulin aspart  0-6 Units Subcutaneous TID WC   insulin glargine-yfgn  15 Units Subcutaneous Daily   ipratropium-albuterol  3 mL Nebulization TID   midodrine  5 mg Oral TID WC   mometasone-formoterol  2 puff Inhalation BID   pantoprazole  40 mg Oral BID   sevelamer carbonate  2,400 mg Oral TID WC   sodium chloride flush  3 mL Intravenous Q12H

## 2023-02-20 NOTE — Progress Notes (Addendum)
[name] [name]  ZOX:[id number] DOB: [birth year] DOA: 02/14/2023 PCP: Benetta Spar, MD    Brief Narrative:  72 year old with a history of CABG November 2023, COPD, DM2, GERD, chronic hypotension on midodrine, and ESRD on PD who was brought to [facility] by his wife 02/15/2023 with significant confusion and progressive shortness of breath over a few days.  In the ER he was found to have an axillary temperature of 99.7 and was tachypneic.  CXR was without a focal infiltrate, though CT abdomen raise a question of some pulmonary infiltrates in lower fields.  He was initially thought to be suffering with a pneumonia, but further evaluation revealed spontaneous bacterial peritonitis felt to be related to his PD catheter.  He was transferred to [facility] 8/2 for ongoing care.  Significant Events: 7/31-8/1 admit via [facility] ED for AMS - transferred to [facility]  8/3 malfunction of PD catheter - R IJ temporary HD cath placed with HD performed 8/4 decline in status post HD prompting PCCM consultation 8/5 blood cultures positive for Candida glabrata and staph species 8/5 PD catheter removed in OR  Goals of Care:   Code Status: Full Code   DVT prophylaxis: heparin injection 5,000 Units Start: 02/15/23 1745  Interim Hx: Afebrile.  Vital signs stable.  Requiring elevated oxygen support on 8 L but with saturations at 99-100%.  Affect is flat but the patient denies any specific complaints.  He appears to be in no acute distress at time of my exam.  Assessment & Plan:  Acute culture negative peritonitis related to peritoneal dialysis catheter with sepsis Antimicrobial per ID - PD catheter removed in OR 8/5  Polymicrobial/Candida glabrata bacteremia R internal jugular HD cath was placed prior to culture results and will likely need to be removed -TTE pending - may require TEE -treatment being guided by ID team  Metabolic encephalopathy due to acute peritonitis Mental status appears to be slowly improving  -avoid sedatives as able -delirium precautions  ESRD on home peritoneal dialysis Has now been transitioned to hemodialysis with nephrology following  Transaminitis -shock liver No biliary dilatation on CT -no pancreatic inflammation on CT -R [place] consistent with cirrhosis -acute viral hepatitis panel negative -statin on hold -LFTs slowly improving  Recent Labs  Lab 02/16/23 0117 02/17/23 0351 02/17/23 2332 02/18/23 0425 02/19/23 0458  AST 2,628* 1,252* 635* 520* 248*  ALT 1,431* 1,018* 686* 594* 359*  ALKPHOS 99 127* 152* 142* 99  BILITOT 0.8 0.7 1.3* 1.0 1.2  PROT 5.3* 5.2* 5.3* 5.1* 5.0*  ALBUMIN <1.5* 1.6* 1.6* <1.5* <1.5*    CAD status post CABG November 2023 -demand myocardial ischemia Chronic LBBB on EKG - chest pain-free - on DAPT  Hypokalemia Not severe -avoid replacement for now and monitor in this patient with ESRD  Hypomagnesemia Gently replaced in this patient with ESRD  DM2 CBG well-controlled  Obesity - Body mass index is 35.69 kg/m.   Family Communication: Spoke with multiple family members at bedside Disposition: Suspect patient will require rehab prior to discharge   Objective: Blood pressure 114/61, pulse 75, temperature 98.2 F (36.8 C), temperature source Oral, resp. rate (!) 24, height 5\' 6"  (1.676 m), weight 100.3 kg, SpO2 99%.  Intake/Output Summary (Last 24 hours) at 02/20/2023 0826 Last data filed at 02/20/2023 0400 Gross per 24 hour  Intake 584.04 ml  Output --  Net 584.04 ml   Filed Weights   02/17/23 0500 02/19/23 0500 02/20/23 0423  Weight: 99.6 kg 99.7 kg 100.3  kg    Examination: General: No acute respiratory distress Lungs: Clear to auscultation bilaterally -no wheezing Cardiovascular: Regular rate and rhythm without murmur  Abdomen: Nontender, nondistended, soft, bowel sounds positive, no rebound, no ascites, no appreciable mass Extremities: Trace edema bilateral lower extremities  CBC: Recent Labs  Lab 02/17/23 0351  02/17/23 2332 02/18/23 0425 02/19/23 0458 02/20/23 0126 02/20/23 0538  WBC 6.7   < > 5.1 5.0  --  6.6  NEUTROABS 5.7  --  4.6 4.7  --   --   HGB 10.4*   < > 9.8* 9.4* 9.5* 8.7*  HCT 32.3*   < > 29.8* 28.9* 28.0* 26.4*  MCV 83.9   < > 83.9 83.8  --  83.5  PLT 139*   < > 126*  116* 104*  --  107*   < > = values in this interval not displayed.   Basic Metabolic Panel: Recent Labs  Lab 02/16/23 0117 02/17/23 0351 02/18/23 0425 02/19/23 0458 02/20/23 0126 02/20/23 0538  NA 130*   < > 132* 135 133* 137  K 4.3   < > 3.1* 3.4* 3.8 3.1*  CL 91*   < > 93* 94*  --  99  CO2 19*   < > 26 29  --  23  GLUCOSE 284*   < > 92 109*  --  89  BUN 57*   < > 38* 55*  --  57*  CREATININE 14.71*   < > 9.61* 11.66*  --  11.53*  CALCIUM 7.2*   < > 7.2* 7.8*  --  6.5*  MG  --    < > 1.7 1.8  --  1.6*  PHOS 8.2*  --   --   --   --  5.7*   < > = values in this interval not displayed.   GFR: Estimated Creatinine Clearance: 6.4 mL/min (A) (by C-G formula based on SCr of 11.53 mg/dL (H)).   Scheduled Meds:  aspirin EC  81 mg Oral Daily   Chlorhexidine Gluconate Cloth  6 each Topical Q0600   clopidogrel  75 mg Oral Daily   heparin  5,000 Units Subcutaneous Q8H   insulin aspart  0-5 Units Subcutaneous QHS   insulin aspart  0-6 Units Subcutaneous TID WC   insulin glargine-yfgn  15 Units Subcutaneous Daily   ipratropium-albuterol  3 mL Nebulization TID   midodrine  5 mg Oral TID WC   mometasone-formoterol  2 puff Inhalation BID   pantoprazole  40 mg Oral BID   potassium chloride  20 mEq Oral Once   sevelamer carbonate  2,400 mg Oral TID WC   sodium chloride flush  3 mL Intravenous Q12H   Continuous Infusions:  cefTRIAXone (ROCEPHIN)  IV Stopped (02/19/23 1404)   dextrose Stopped (02/19/23 1511)   micafungin (MYCAMINE) 100 mg in sodium chloride 0.9 % 100 mL IVPB Stopped (02/20/23 0332)     LOS: 5 days   Lonia Blood, MD [facility] Office  [phone] Pager - Text Page per  Loretha Stapler  If 7PM-7AM, please contact night-coverage per Amion 02/20/2023, 8:26 AM

## 2023-02-20 NOTE — Progress Notes (Signed)
Pharmacy Antibiotic Note  [name] [name] is a 72 y.o. male admitted on 02/14/2023 with concern for infected PD cath/peritonitis and now found to have candida glabrata, E avium, and staph haemolyticus in blood cultures. Pharmacy has been consulted for Unasyn dosing.  PD catheter removed 8/5, noted RIJ temp HD cath placed with plans for HD today  Plan: - Unasyn 3g IV x 1 dose now followed by 3g IV every 12 hours for now - Will continue to follow HD schedule/duration, culture results, LOT, and antibiotic de-escalation plans   Height: 5\' 6"  (167.6 cm) Weight: 100.3 kg (221 lb 1.9 oz) IBW/kg (Calculated) : 63.8  Temp (24hrs), Avg:98.4 F (36.9 C), Min:97.6 F (36.4 C), Max:99.7 F (37.6 C)  Recent Labs  Lab 02/17/23 0351 02/17/23 2332 02/18/23 0425 02/19/23 0458 02/20/23 0538  WBC 6.7 5.4 5.1 5.0 6.6  CREATININE 15.03* 8.36* 9.61* 11.66* 11.53*  LATICACIDVEN  --  1.6  --   --   --   VANCORANDOM  --   --   --  26  --     Estimated Creatinine Clearance: 6.4 mL/min (A) (by C-G formula based on SCr of 11.53 mg/dL (H)).    Allergies  Allergen Reactions   Opana [Oxymorphone Hcl] Itching   Tramadol Itching    Antimicrobials this admission: Vanc 8/1 >> 8/5 (VR 26 on 8/5 AM) Flagyl 8/1 >> 8/2 Cefepime 8/1 >> 8/3 Zosyn 8/4 >> 8/5 Rocephin 8/5 >> 8/6 Unasyn 8/6 >> Mica 8/5 >>  Dose adjustments this admission:   Microbiology results: 8/1 BCx >> ngx4d 8/1 Peritoneal fluid >> ngx3d 8/4 BCx >> 1 set with E avium, C glabrata, S haemolyticus, other set ngtd 8/5 BCx >> ngtd 8/5 PD fluid >> ngtd  Thank you for allowing pharmacy to be a part of this patient's care.  Georgina Pillion, PharmD, BCPS, BCIDP Infectious Diseases Clinical Pharmacist 02/20/2023 1:32 PM   **Pharmacist phone directory can now be found on [email or web address] ([email or web address]).  Listed under [facility].

## 2023-02-20 NOTE — Progress Notes (Signed)
[facility]    Date of Admission:  02/14/2023   Total days of antibiotics 5          ID: [name] [name] is a 72 y.o. male with bacterial peritonitis and secondary bacteremia/fungemia. Hd temp catheter placed on 8/3 Principal Problem:   Acute encephalopathy Active Problems:   Hyperlipidemia   Essential hypertension   GERD   OSA on CPAP   CAD (coronary artery disease)   Morbid obesity due to excess calories (HCC)   ESRD (end stage renal disease) (HCC)   NSTEMI (non-ST elevated myocardial infarction) (HCC)   S/P CABG x 2   Peritoneal dialysis catheter in place [facility])   Memory loss    Subjective: Febrile this morning, still has some localized abdominal pain.   Medications:   aspirin EC  81 mg Oral Daily   Chlorhexidine Gluconate Cloth  6 each Topical Q0600   clopidogrel  75 mg Oral Daily   heparin  5,000 Units Subcutaneous Q8H   insulin aspart  0-5 Units Subcutaneous QHS   insulin aspart  0-6 Units Subcutaneous TID WC   insulin glargine-yfgn  15 Units Subcutaneous Daily   ipratropium-albuterol  3 mL Nebulization TID   midodrine  5 mg Oral TID WC   mometasone-formoterol  2 puff Inhalation BID   pantoprazole  40 mg Oral BID   sevelamer carbonate  2,400 mg Oral TID WC   sodium chloride flush  3 mL Intravenous Q12H    Objective: Vital signs in last 24 hours: Temp:  [97.5 F (36.4 C)-99.7 F (37.6 C)] 98.2 F (36.8 C) (08/06 0735) Pulse Rate:  [69-165] 75 (08/06 0800) Resp:  [18-32] 24 (08/06 0800) BP: (101-127)/(55-98) 114/61 (08/06 0800) SpO2:  [85 %-100 %] 99 % (08/06 0800) FiO2 (%):  [40 %] 40 % (08/05 2339) Weight:  [100.3 kg] 100.3 kg (08/06 0423)  Physical Exam  Constitutional: He is oriented to person, place, and time. He appears well-developed and well-nourished. No distress.  HENT:  Mouth/Throat: Oropharynx is clear and moist. No oropharyngeal exudate.  Cardiovascular: Normal rate, regular rhythm and normal heart sounds. Exam  reveals no gallop and no friction rub.  No murmur heard.  Neck: hd temp cath Pulmonary/Chest: Effort normal and breath sounds normal. No respiratory distress. He has no wheezes.  Abdominal: Soft. Bowel sounds are normal. He exhibits no distension. There is no tenderness. Bandaged site Lymphadenopathy:  He has no cervical adenopathy.  Neurological: He is alert and oriented to person, place, and time.  Skin: Skin is warm and dry. No rash noted. No erythema.  Psychiatric: He has a normal mood and affect. His behavior is normal.    Lab Results Recent Labs    02/19/23 0458 02/20/23 0126 02/20/23 0538  WBC 5.0  --  6.6  HGB 9.4* 9.5* 8.7*  HCT 28.9* 28.0* 26.4*  NA 135 133* 137  K 3.4* 3.8 3.1*  CL 94*  --  99  CO2 29  --  23  BUN 55*  --  57*  CREATININE 11.66*  --  11.53*   Liver Panel Recent Labs    02/18/23 0425 02/19/23 0458  PROT 5.1* 5.0*  ALBUMIN <1.5* <1.5*  AST 520* 248*  ALT 594* 359*  ALKPHOS 142* 99  BILITOT 1.0 1.2    C-Reactive Protein Recent Labs    02/18/23 0425 02/19/23 0458  CRP 12.3* 22.1*    Microbiology: 8/5 blood cx ngtd 8/5 peritoneal fluid/catheter removal = NGTD  8/4 blood cx am - NGTD 8/4 blood cx -    Abnormal  CANDIDA GLABRATA ENTEROCOCCUS AVIUM STAPHYLOCOCCUS HAEMOLYTICUS  8/4 blood cx - NGTD 8/1 peritoneal fluid - NGTD 8/1 blood cx - NGTD Studies/Results: ECHOCARDIOGRAM COMPLETE  Result Date: 02/19/2023    ECHOCARDIOGRAM REPORT   Patient Name:   [name] [name] Date of Exam: 02/19/2023 Medical Rec #:  [id number]      Height:       66.0 in Accession #:    [id number]     Weight:       219.8 lb Date of Birth:  05-03-1951     BSA:          2.082 m Patient Age:    71 years       BP:           110/93 mmHg Patient Gender: M              HR:           74 bpm. Exam Location:  Inpatient Procedure: 2D Echo, Color Doppler, Cardiac Doppler and Intracardiac            Opacification Agent Indications:     Endocarditis  History:         Patient has  prior history of Echocardiogram examinations, most                  recent 05/31/2022. Previous Myocardial Infarction and CAD,                  COPD, Arrythmias:NSTEMI; Risk Factors:Sleep Apnea, Diabetes and                  Hypertension.  Sonographer:     Raeford Razor Referring Phys:  Levy Pupa, S Diagnosing Phys: [facility]  Sonographer Comments: Technically difficult study due to poor echo windows. Image acquisition challenging due to patient body habitus. IMPRESSIONS  1. Akinesis of the anterior /anteroseptal/inferoseptal wall from base to apex. Left ventricular ejection fraction, by estimation, is 40 to 45%. The left ventricle has mildly decreased function. Left ventricular diastolic parameters are indeterminate.  2. Right ventricular systolic function is moderately reduced. The right ventricular size is normal.  3. Left atrial size was mildly dilated.  4. The mitral valve was not well visualized. No evidence of mitral valve regurgitation.  5. The aortic valve was not well visualized. Aortic valve regurgitation is not visualized. Mild aortic valve stenosis.  6. The inferior vena cava is normal in size with greater than 50% respiratory variability, suggesting right atrial pressure of 3 mmHg. Comparison(s): No significant change from prior study. FINDINGS  Left Ventricle: Akinesis of the anterior /anteroseptal/inferoseptal wall from base to apex. Left ventricular ejection fraction, by estimation, is 40 to 45%. The left ventricle has mildly decreased function. Definity contrast agent was given IV to delineate the left ventricular endocardial borders. The left ventricular internal cavity size was normal in size. There is no left ventricular hypertrophy. Left ventricular diastolic parameters are indeterminate. Right Ventricle: The right ventricular size is normal. Right ventricular systolic function is moderately reduced. Left Atrium: Left atrial size was mildly dilated. Right Atrium: Right atrial size was normal  in size. Pericardium: There is no evidence of pericardial effusion. Mitral Valve: The mitral valve was not well visualized. No evidence of mitral valve regurgitation. Tricuspid Valve: Tricuspid valve regurgitation is mild. Aortic Valve: The aortic valve was not well visualized. Aortic valve regurgitation is not visualized. Mild aortic stenosis  is present. Aortic valve mean gradient measures 15.0 mmHg. Aortic valve peak gradient measures 27.2 mmHg. Aortic valve area, by VTI  measures 1.57 cm. Pulmonic Valve: Pulmonic valve regurgitation is not visualized. Aorta: The aortic root and ascending aorta are structurally normal, with no evidence of dilitation. Venous: The inferior vena cava is normal in size with greater than 50% respiratory variability, suggesting right atrial pressure of 3 mmHg. IAS/Shunts: No atrial level shunt detected by color flow Doppler.  LEFT VENTRICLE PLAX 2D LVIDd:         5.20 cm     Diastology LVIDs:         3.80 cm     LV e' medial:    10.70 cm/s LV PW:         1.00 cm     LV E/e' medial:  8.5 LV IVS:        1.00 cm     LV e' lateral:   8.49 cm/s LVOT diam:     2.00 cm     LV E/e' lateral: 10.7 LV SV:         68 LV SV Index:   33 LVOT Area:     3.14 cm  LV Volumes (MOD) LV vol d, MOD A2C: 95.4 ml LV vol d, MOD A4C: 49.6 ml LV vol s, MOD A2C: 47.0 ml LV vol s, MOD A4C: 24.7 ml LV SV MOD A2C:     48.4 ml LV SV MOD A4C:     49.6 ml LV SV MOD BP:      33.3 ml RIGHT VENTRICLE            IVC RV Basal diam:  3.30 cm    IVC diam: 1.20 cm RV S prime:     6.31 cm/s TAPSE (M-mode): 1.7 cm LEFT ATRIUM           Index        RIGHT ATRIUM           Index LA diam:      3.90 cm 1.87 cm/m   RA Area:     18.80 cm LA Vol (A4C): 82.5 ml 39.63 ml/m  RA Volume:   49.40 ml  23.73 ml/m  AORTIC VALVE AV Area (Vmax):    1.36 cm AV Area (Vmean):   1.26 cm AV Area (VTI):     1.57 cm AV Vmax:           261.00 cm/s AV Vmean:          181.000 cm/s AV VTI:            0.431 m AV Peak Grad:      27.2 mmHg AV Mean  Grad:      15.0 mmHg LVOT Vmax:         113.00 cm/s LVOT Vmean:        72.600 cm/s LVOT VTI:          0.216 m LVOT/AV VTI ratio: 0.50  AORTA Ao Root diam: 3.00 cm Ao Asc diam:  3.10 cm MITRAL VALVE MV Area (PHT): 5.84 cm    SHUNTS MV Decel Time: 130 msec    Systemic VTI:  0.22 m MV E velocity: 91.20 cm/s  Systemic Diam: 2.00 cm Carolan Clines Electronically signed by Carolan Clines Signature Date/Time: 02/19/2023/6:44:33 PM    Final (Updated)    DG Chest Port 1 View  Result Date: 02/19/2023 CLINICAL DATA:  16109 Respiratory failure (HCC) 60454 EXAM: PORTABLE CHEST 1 VIEW  COMPARISON:  02/17/2023 FINDINGS: Right IJ approach central venous catheter terminates at the level of the right atrium. Stable heart size status post sternotomy and CABG. Slightly low lung volumes. Persistent bibasilar opacities, similar to slightly progressed from prior. No pleural effusion or pneumothorax. IMPRESSION: Persistent bibasilar opacities, which may represent atelectasis or pneumonia. Electronically Signed   By: Duanne Guess D.O.   On: 02/19/2023 08:30   VAS [place] LOWER EXTREMITY VENOUS (DVT)  Result Date: 02/18/2023  Lower Venous DVT Study Patient Name:  JOSELITO CHOUDRY [facility]  Date of Exam:   02/18/2023 Medical Rec #: [id number]       Accession #:    [id number] Date of Birth: 05/15/1951      Patient Gender: M Patient Age:   65 years Exam Location:  [facility] Procedure:      VAS [place] LOWER EXTREMITY VENOUS (DVT) Referring Phys: MURALI RAMASWAMY --------------------------------------------------------------------------------  Indications: Edema.  Comparison Study: No recent prior studies. Performing Technologist: Jean Rosenthal RDMS, RVT  Examination Guidelines: A complete evaluation includes B-mode imaging, spectral Doppler, color Doppler, and power Doppler as needed of all accessible portions of each vessel. Bilateral testing is considered an integral part of a complete examination. Limited examinations for reoccurring indications may be  performed as noted. The reflux portion of the exam is performed with the patient in reverse Trendelenburg.  +---------+---------------+---------+-----------+----------+--------------+ RIGHT    CompressibilityPhasicitySpontaneityPropertiesThrombus Aging +---------+---------------+---------+-----------+----------+--------------+ CFV      Full           Yes      Yes                                 +---------+---------------+---------+-----------+----------+--------------+ SFJ      Full                                                        +---------+---------------+---------+-----------+----------+--------------+ FV Prox  Full                                                        +---------+---------------+---------+-----------+----------+--------------+ FV Mid   Full                                                        +---------+---------------+---------+-----------+----------+--------------+ FV DistalFull                                                        +---------+---------------+---------+-----------+----------+--------------+ PFV      Full                                                        +---------+---------------+---------+-----------+----------+--------------+  POP      Full           Yes      Yes                                 +---------+---------------+---------+-----------+----------+--------------+ PTV      Full                                                        +---------+---------------+---------+-----------+----------+--------------+ PERO     Full                                                        +---------+---------------+---------+-----------+----------+--------------+   +---------+---------------+---------+-----------+----------+--------------+ LEFT     CompressibilityPhasicitySpontaneityPropertiesThrombus Aging +---------+---------------+---------+-----------+----------+--------------+ CFV      Full            Yes      Yes                                 +---------+---------------+---------+-----------+----------+--------------+ SFJ      Full                                                        +---------+---------------+---------+-----------+----------+--------------+ FV Prox  Full                                                        +---------+---------------+---------+-----------+----------+--------------+ FV Mid   Full                                                        +---------+---------------+---------+-----------+----------+--------------+ FV DistalFull                                                        +---------+---------------+---------+-----------+----------+--------------+ PFV      Full                                                        +---------+---------------+---------+-----------+----------+--------------+ POP      Full           Yes      Yes                                 +---------+---------------+---------+-----------+----------+--------------+  PTV      Full                                                        +---------+---------------+---------+-----------+----------+--------------+ PERO     Full                                                        +---------+---------------+---------+-----------+----------+--------------+     Summary: RIGHT: - There is no evidence of deep vein thrombosis in the lower extremity.  - No cystic structure found in the popliteal fossa.  LEFT: - There is no evidence of deep vein thrombosis in the lower extremity.  - No cystic structure found in the popliteal fossa.  *See table(s) above for measurements and observations. Electronically signed by Heath Lark on 02/18/2023 at 5:05:26 PM.    Final    EEG adult  Result Date: 02/18/2023 Windell Norfolk, MD     02/18/2023  9:49 AM Patient Name: EXTON SUHRE MRN: [id number] Epilepsy Attending: Windell Norfolk Referring Physician/Provider:  Dr. Delton Coombes Date: 02/18/2023 Duration: 23 minutes Patient history: 53 year man with sepsis and AMS,  EEG to evaluate for seizure Level of alertness: lethargic AEDs during EEG study: None Technical aspects: This EEG study was done with scalp electrodes positioned according to the 10-20 International system of electrode placement. Electrical activity was reviewed with band pass filter of 1-70Hz , sensitivity of 7 uV/mm, display speed of 75mm/sec with a 60Hz  notched filter applied as appropriate. EEG data were recorded continuously and digitally stored.  Video monitoring was available and reviewed as appropriate. Description: EEG showed continuous/intermittent generalized polymorphic sharply contoured 3 to 6 Hz theta-delta slowing. Sleep was not seen. Hyperventilation and photic stimulation were not performed. No seizure or epileptiform discharges seen during this recording. ABNORMALITY - Continuous slow, generalized IMPRESSION: This study is suggestive of mild to moderate diffuse encephalopathy, nonspecific etiology but likely related to sedation, toxic-metabolic etiology. Amadou Camara     Assessment/Plan: PD related peritonitis = currently on broad spectrum abtx. Cultures remain negative but cell count c/w peritonitis  Bacteremia/fungemia - patient has candidemia glabrata, in addition to enterococcal avium and staph haemolyticus. Suspect it is secondary to peritonitis/gi source. Will change abtx to amp/sub (instead of ceftriaxone) and continue micafungin for c.glabrata fungemia. Plan for at least 2 wk of treatment. Await for sensitivities to be resulted.  Thus far appears only in 1 set of blood cx -> will wait on doing TEE for the time being, suspect transient translocation of bacteria.  Agree with plan for HD temp catheter removal and line holiday since it was placed on 8/3--as discussed with dr Arlean Hopping.  ESRD = will need to do HD today, then line holiday  Fevers = appeared resolved. Will continue to  monitor    [facility] Pager: [phone]  02/20/2023, 9:05 AM

## 2023-02-20 NOTE — Progress Notes (Signed)
Doing well POD1 TDC removal.   Alert, oriented, no change in mental status compared to yesterday.  Pain controlled.  Incisions dry   Pt is not a candidate for long-term HD access at this time due to current clinical condition. Will sign off at this point. Will follow up for fistula in the outpt setting.    Victorino Sparrow MD

## 2023-02-20 NOTE — Progress Notes (Signed)
   02/20/23 2104  Vitals  Temp 98.3 F (36.8 C)  Temp Source Oral  BP (!) 122/97  BP Method Automatic  Pulse Rate (!) 103  Pulse Rate Source Monitor  Resp (!) 23  Oxygen Therapy  SpO2 100 %  During Treatment Monitoring  Intra-Hemodialysis Comments See progress note (post rinseback)  Post Treatment  Liters Processed 95  Tolerated HD Treatment Yes  Post-Hemodialysis Comments pt stable  Hemodialysis Catheter Right Internal jugular Triple lumen Temporary (Non-Tunneled)  Placement Date/Time: 02/17/23 1021   Placed prior to admission: No  Serial / Lot #: [id number]  Expiration Date: 05/19/27  Time Out: Correct patient;Correct site;Correct procedure  Maximum sterile barrier precautions: Hand hygiene;Cap;Mask;Sterile gow...  Site Condition No complications  Blue Lumen Status Heparin locked  Red Lumen Status Heparin locked  Catheter fill volume (Arterial) 1.5 cc  Catheter fill volume (Venous) 1.5  Dressing Type Transparent  Dressing Status Clean, Dry, Intact  Interventions New dressing  Drainage Description None  Dressing Change Due 02/24/23  Post treatment catheter status Capped and Clamped   Pt transported to room safely. Report given to J.Caryn Section RN

## 2023-02-20 NOTE — Progress Notes (Signed)
Physical Therapy Treatment Patient Details Name: [name] [name] MRN: [id number] DOB: 1950-09-21 Today's Date: 02/20/2023   History of Present Illness 72 y/o male admitted 7/31 for confusion and progressive SOB, pt with PNA and transaminitis.8/3 PD catheter malfunction. 8/5 Peritoneal dialysis catheter removed. PMHx:  CABG, COPD, IDT2DM, ESRD on PD, CAD, Rt TKA, HTN, gout    PT Comments  Pt with flat affect, limited verbalization and interactions with extremely slow processing. Pt requiring mod assist to transition to EOb with max cues and repeated attempts before pt would engage and initiate movement. Pt only able to pivot to chair with RW with lack of awareness for deficits or need for assist. Pt in chair with tray end of session and stated he wanted applesauce which was present but pt would not attempt to feed self even with cues and initiation. Pt would eat when fed. Pt also keeping rt hand in flexion throughout. Pt with decliner in cognition and function since eval with D/C plan updated. Patient will benefit from continued inpatient follow up therapy, <3 hours/day. Will continue to follow.  95% on 6L     If plan is discharge home, recommend the following: A lot of help with walking and/or transfers;A lot of help with bathing/dressing/bathroom;Assistance with cooking/housework;Assistance with feeding;Direct supervision/assist for financial management;Direct supervision/assist for medications management   Can travel by private vehicle     No  Equipment Recommendations  BSC/3in1;Wheelchair (measurements PT)    Recommendations for Other Services       Precautions / Restrictions Precautions Precautions: Fall;Other (comment) Precaution Comments: watch sats Restrictions Weight Bearing Restrictions: No     Mobility  Bed Mobility Overal bed mobility: Needs Assistance Bed Mobility: Supine to Sit     Supine to sit: Mod assist, HOB elevated     General bed mobility comments: HOB 30  degrees, max assist to initiatemovement with mod assist to move legs toward EOB    Transfers Overall transfer level: Needs assistance   Transfers: Sit to/from Stand, Bed to chair/wheelchair/BSC Sit to Stand: Min assist   Step pivot transfers: Min assist       General transfer comment: min assist to stand from bed with cues for hand placement and sequence, increased time. Min assist to step pivot to chair as pt began taking short shuffling steps but could not sufficiently advance with mod assist for proximity to RW and control of RW. Pt then stepped back 2 steps to chair    Ambulation/Gait               General Gait Details: not currently able   Stairs             Wheelchair Mobility     Tilt Bed    Modified Rankin (Stroke Patients Only)       Balance Overall balance assessment: Needs assistance Sitting-balance support: No upper extremity supported, Feet supported Sitting balance-Leahy Scale: Fair     Standing balance support: Bilateral upper extremity supported, During functional activity, Reliant on assistive device for balance Standing balance-Leahy Scale: Poor Standing balance comment: RW in standing                            Cognition Arousal/Alertness: Awake/alert Behavior During Therapy: Flat affect Overall Cognitive Status: Impaired/Different from baseline Area of Impairment: Orientation, Attention, Memory, Following commands, Safety/judgement, Awareness, Problem solving                 Orientation Level:  Disoriented to, Time, Situation Current Attention Level: Focused Memory: Decreased short-term memory Following Commands: Follows one step commands inconsistently, Follows one step commands with increased time Safety/Judgement: Decreased awareness of deficits, Decreased awareness of safety   Problem Solving: Slow processing, Requires verbal cues, Requires tactile cues, Decreased initiation General Comments: Pt with  significant cognitive decline from eval with pt requiring cueing x 3 before pt would respond to name, would not answer date. Consistently with blank stare then random periods of answering questions promptly. Increased time and max cues to initiate transfers and mobility        Exercises      General Comments        Pertinent Vitals/Pain Pain Assessment Pain Assessment: No/denies pain    Home Living                          Prior Function            PT Goals (current goals can now be found in the care plan section) Progress towards PT goals: Not progressing toward goals - comment (cognitive and physical decline from eval)    Frequency    Min 1X/week      PT Plan Discharge plan needs to be updated    Co-evaluation              AM-PAC PT "6 Clicks" Mobility   Outcome Measure  Help needed turning from your back to your side while in a flat bed without using bedrails?: A Lot Help needed moving from lying on your back to sitting on the side of a flat bed without using bedrails?: A Lot Help needed moving to and from a bed to a chair (including a wheelchair)?: A Little Help needed standing up from a chair using your arms (e.g., wheelchair or bedside chair)?: A Little Help needed to walk in hospital room?: A Lot Help needed climbing 3-5 steps with a railing? : Total 6 Click Score: 13    End of Session Equipment Utilized During Treatment: Gait belt;Oxygen Activity Tolerance: Patient tolerated treatment well Patient left: in chair;with call bell/phone within reach;with chair alarm set Nurse Communication: Mobility status PT Visit Diagnosis: Muscle weakness (generalized) (M62.81);Unsteadiness on feet (R26.81)     Time: 2130-8657 PT Time Calculation (min) (ACUTE ONLY): 24 min  Charges:    $Therapeutic Activity: 23-37 mins PT General Charges $$ ACUTE PT VISIT: 1 Visit                     Merryl Hacker, PT Acute Rehabilitation Services Office:  [phone]    Enedina Finner  02/20/2023, 10:52 AM

## 2023-02-20 NOTE — Progress Notes (Signed)
Pt pulled BIPAP mask off.  RN and RT encourage pt to wear BIPAP for the night. Pt still refusing.  Pt placed back on HFNC.

## 2023-02-20 NOTE — Progress Notes (Addendum)
eLink Physician-Brief Progress Note Patient Name: [name] [name] DOB: June 15, 1951 MRN: [id number]   Date of Service  02/20/2023  HPI/Events of Note  Can the pt get anything for anxiety, he keeps pulling off his nasal cannula and bipap causing him to desat. QTc is 465  Discussed with RN. S/p OR for PD cath removal. ESRD. Cirrhosis. CABG/COPD  Camera  HR 80. MAP good.   eICU Interventions  Get Abg stat, if pco2 rising, need precedex and BiPAP. Asp precautions     Intervention Category Intermediate Interventions: Other: Minor Interventions: Agitation / anxiety - evaluation and management  Ranee Gosselin 02/20/2023, 1:13 AM  01:45 ABG is back, pco2 at 35. Hco3 at 23. Takes xanax at home.  Low dose xanax ordered .

## 2023-02-21 ENCOUNTER — Encounter (HOSPITAL_COMMUNITY): Payer: Medicare Other

## 2023-02-21 ENCOUNTER — Encounter (HOSPITAL_COMMUNITY): Payer: Self-pay | Admitting: *Deleted

## 2023-02-21 ENCOUNTER — Inpatient Hospital Stay (HOSPITAL_COMMUNITY): Payer: Medicare Other

## 2023-02-21 DIAGNOSIS — I25118 Atherosclerotic heart disease of native coronary artery with other forms of angina pectoris: Secondary | ICD-10-CM

## 2023-02-21 DIAGNOSIS — I10 Essential (primary) hypertension: Secondary | ICD-10-CM | POA: Diagnosis not present

## 2023-02-21 DIAGNOSIS — N186 End stage renal disease: Secondary | ICD-10-CM

## 2023-02-21 DIAGNOSIS — K659 Peritonitis, unspecified: Secondary | ICD-10-CM | POA: Diagnosis not present

## 2023-02-21 DIAGNOSIS — G9340 Encephalopathy, unspecified: Secondary | ICD-10-CM | POA: Diagnosis not present

## 2023-02-21 DIAGNOSIS — Z951 Presence of aortocoronary bypass graft: Secondary | ICD-10-CM

## 2023-02-21 DIAGNOSIS — B49 Unspecified mycosis: Secondary | ICD-10-CM | POA: Diagnosis not present

## 2023-02-21 DIAGNOSIS — I214 Non-ST elevation (NSTEMI) myocardial infarction: Secondary | ICD-10-CM

## 2023-02-21 LAB — GLUCOSE, CAPILLARY
Glucose-Capillary: 130 mg/dL — ABNORMAL HIGH (ref 70–99)
Glucose-Capillary: 68 mg/dL — ABNORMAL LOW (ref 70–99)
Glucose-Capillary: 82 mg/dL (ref 70–99)
Glucose-Capillary: 92 mg/dL (ref 70–99)
Glucose-Capillary: 85 mg/dL (ref 70–99)
Glucose-Capillary: 87 mg/dL (ref 70–99)

## 2023-02-21 LAB — BLOOD GAS, ARTERIAL
Acid-Base Excess: 2.9 mmol/L — ABNORMAL HIGH (ref 0.0–2.0)
Bicarbonate: 25.9 mmol/L (ref 20.0–28.0)
O2 Saturation: 100 %
Patient temperature: 37.5
pCO2 arterial: 35 mmHg (ref 32–48)
pH, Arterial: 7.48 — ABNORMAL HIGH (ref 7.35–7.45)
pO2, Arterial: 143 mmHg — ABNORMAL HIGH (ref 83–108)

## 2023-02-21 LAB — CULTURE, BLOOD (ROUTINE X 2)
Culture: NO GROWTH
Special Requests: ADEQUATE

## 2023-02-21 MED ORDER — ACETAMINOPHEN 650 MG RE SUPP: 650.0000 mg | Freq: Once | RECTAL | Status: DC | PRN

## 2023-02-21 MED ORDER — CHLORHEXIDINE GLUCONATE CLOTH 2 % EX PADS: 6.0000 | MEDICATED_PAD | Freq: Every day | CUTANEOUS | Status: DC

## 2023-02-21 MED ORDER — PANTOPRAZOLE SODIUM 40 MG IV SOLR
40.0000 mg | Freq: Two times a day (BID) | INTRAVENOUS | Status: DC
  Administered 2023-02-21 – 2023-02-24 (×6): 40 mg via INTRAVENOUS
  Filled 2023-02-21 (×6): qty 10

## 2023-02-21 MED ORDER — IPRATROPIUM-ALBUTEROL 0.5-2.5 (3) MG/3ML IN SOLN
3.0000 mL | Freq: Two times a day (BID) | RESPIRATORY_TRACT | Status: DC
  Administered 2023-02-21 – 2023-02-25 (×9): 3 mL via RESPIRATORY_TRACT
  Filled 2023-02-21 (×10): qty 3

## 2023-02-21 MED ORDER — DEXTROSE 50 % IV SOLN
INTRAVENOUS | Status: AC
  Filled 2023-02-21: qty 50

## 2023-02-21 MED ORDER — ACETAMINOPHEN 500 MG PO TABS: 1000.0000 mg | ORAL_TABLET | Freq: Once | ORAL | Status: DC | PRN

## 2023-02-21 MED ORDER — IOHEXOL 9 MG/ML PO SOLN: 500.0000 mL | ORAL | Status: AC

## 2023-02-21 MED ORDER — HYDROMORPHONE HCL 1 MG/ML IJ SOLN
0.5000 mg | INTRAMUSCULAR | Status: DC | PRN
  Administered 2023-02-22 – 2023-02-26 (×10): 0.5 mg via INTRAVENOUS
  Filled 2023-02-21 (×13): qty 0.5

## 2023-02-21 MED ORDER — LORAZEPAM 2 MG/ML IJ SOLN
1.0000 mg | Freq: Once | INTRAMUSCULAR | Status: AC
  Administered 2023-02-21: 1 mg via INTRAVENOUS
  Filled 2023-02-21: qty 1

## 2023-02-21 NOTE — Progress Notes (Signed)
eLink Physician-Brief Progress Note Patient Name: [name] [name] DOB: [birth year] MRN: [id number]   Date of Service  02/21/2023  HPI/Events of Note  71 year old male that was admitted with HCAP and peritonitis complaining of some chest pain but is still confused.  He is refusing to take oral medications.  Currently, prescribed hydromorphone and as needed fentanyl for breakthrough.  eICU Interventions  Maintain hydromorphone, switch to IV  Switch Protonix to IV.  Also on midodrine, will initiate peripheral vasopressors if necessary.   4098 -patient is restrained but still try to get out of bed.  Received Ativan at 2133 with short-term effect.  QTc 480.  At the time of evaluation, the patient is calm and resting.  Will add on Haldol x 2 as needed.  Intervention Category Intermediate Interventions: Pain - evaluation and management    02/21/2023, 9:14 PM

## 2023-02-21 NOTE — Progress Notes (Signed)
OT Cancellation Note  Patient Details Name: [name] [name] MRN: [id number] DOB: [birth year]   Cancelled Treatment:    Reason Eval/Treat Not Completed: Medical issues which prohibited therapy (Pt transferred to ICU this morning due to worsening hypoxia and encephalopathy. RN requests holding OT evaluation at this time. OT to reattempt skilled OT eval at a later time as appropriate/available.)   "Orson Eva., OTR/L, MA Acute Rehab [phone]   Lendon Colonel 02/21/2023, 9:22 AM

## 2023-02-21 NOTE — Progress Notes (Signed)
   02/20/23 2230  BiPAP/CPAP/SIPAP  $ Non-Invasive Ventilator  Non-Invasive Vent Set Up  $ Face Mask Large  Yes  BiPAP/CPAP/SIPAP Pt Type Adult  BiPAP/CPAP/SIPAP V60  Reason BIPAP/CPAP not in use Non-compliant (pt refused bipap)  Mask Type Full face mask  Mask Size Large  BiPAP/CPAP /SiPAP Vitals  SpO2 95 %

## 2023-02-21 NOTE — Progress Notes (Addendum)
Called consult to [facility] associates for on call Ophtho. Left a message for triage nurse to call back.  Steffanie Dunn, DO 02/21/23 8:27 AM [facility]  For contact information, see Amion. If no response to pager, please call PCCM consult pager. After hours, 7PM- 7AM, please call Elink.      Spoke to triage nurse, consult will be completed today.  Steffanie Dunn, DO 02/21/23 9:21 AM [facility]  For contact information, see Amion. If no response to pager, please call PCCM consult pager. After hours, 7PM- 7AM, please call Elink.

## 2023-02-21 NOTE — Progress Notes (Signed)
[facility]    Date of Admission:  02/14/2023   Total days of antibiotics 7           ID: [name] [name] is a 72 y.o. male with  Principal Problem:   Acute encephalopathy Active Problems:   Hyperlipidemia   Essential hypertension   GERD   OSA on CPAP   CAD (coronary artery disease)   Morbid obesity due to excess calories (HCC)   ESRD (end stage renal disease) (HCC)   NSTEMI (non-ST elevated myocardial infarction) (HCC)   S/P CABG x 2   Peritoneal dialysis catheter in place [facility])   Memory loss    Subjective: Having ongoing fevers, confusion, requiring to be in restraints, in ICU. Had HD last night, removed temp HD.  Ophtho evaluated patient -negative for endophthalmitis  Medications:   aspirin EC  81 mg Oral Daily   Chlorhexidine Gluconate Cloth  6 each Topical Q0600   [START ON 02/22/2023] Chlorhexidine Gluconate Cloth  6 each Topical Q0600   clopidogrel  75 mg Oral Daily   heparin  5,000 Units Subcutaneous Q8H   insulin aspart  0-5 Units Subcutaneous QHS   insulin aspart  0-6 Units Subcutaneous TID WC   ipratropium-albuterol  3 mL Nebulization BID   midodrine  5 mg Oral TID WC   mometasone-formoterol  2 puff Inhalation BID   pantoprazole  40 mg Oral BID   potassium chloride  20 mEq Oral Once   sevelamer carbonate  2,400 mg Oral TID WC   sodium chloride flush  3 mL Intravenous Q12H    Objective: Vital signs in last 24 hours: Temp:  [98.3 F (36.8 C)-100.7 F (38.2 C)] 100.5 F (38.1 C) (08/07 1542) Pulse Rate:  [25-127] 113 (08/07 1050) Resp:  [17-47] 21 (08/07 1050) BP: (94-144)/(57-97) 119/74 (08/07 1050) SpO2:  [69 %-100 %] 94 % (08/07 1050) Weight:  [97.4 kg-97.8 kg] 97.4 kg (08/07 0823)  Physical Exam  Constitutional: He is oriented to person, only. He appears well-developed and well-nourished. No distress.  HENT:  Mouth/Throat: Oropharynx is clear and moist. No oropharyngeal exudate.  Cardiovascular: Normal rate, regular  rhythm and normal heart sounds. Exam reveals no gallop and no friction rub.  No murmur heard.  Pulmonary/Chest: Effort normal and breath sounds normal. No respiratory distress. He has no wheezes.  Abdominal: Soft. Bowel sounds are normal. He exhibits no distension. Mild left lower tenderness.  Lymphadenopathy:  He has no cervical adenopathy.  Neurological: He is alert and oriented to person, place, and time.  Skin: Skin is warm and dry. No rash noted. No erythema.  Psychiatric: He has a normal mood and affect. His behavior is normal.    Lab Results Recent Labs    02/20/23 0538 02/21/23 0407  WBC 6.6 8.7  HGB 8.7* 8.6*  HCT 26.4* 26.3*  NA 137 135  K 3.1* 4.0  CL 99 94*  CO2 23 26  BUN 57* 34*  CREATININE 11.53* 8.88*   Liver Panel Recent Labs    02/19/23 0458 02/21/23 0407  PROT 5.0* 5.1*  ALBUMIN <1.5* <1.5*  AST 248* 75*  ALT 359* 178*  ALKPHOS 99 87  BILITOT 1.2 1.2   Sedimentation Rate No results for input(s): "ESRSEDRATE" in the last 72 hours. C-Reactive Protein Recent Labs    02/19/23 0458  CRP 22.1*    Microbiology: 8/5 blood cx ngtd 8/5 peritoneal fluid NGTD 8/5 am blood cx NGTD 8/4 blood cx c.glabrata, staph haemolyticus  and e.avium 8/4 blood cx NGTD 8/1 blood cx NGTD 8/1 peritoneal fluid cx NGTD MRSA screen NEGATIVE Studies/Results: DG CHEST PORT 1 VIEW  Result Date: 02/21/2023 CLINICAL DATA:  72 year old male with history of tachypnea. EXAM: PORTABLE CHEST 1 VIEW COMPARISON:  Chest x-ray 02/19/2023. FINDINGS: Right internal jugular central venous catheter with tip terminating in the right atrium. Lung volumes are very low. Patchy areas of interstitial prominence, diffuse peribronchial cuffing, and ill-defined airspace disease noted throughout the lungs bilaterally, most evident in the mid to lower lungs. Small bilateral pleural effusions. No pneumothorax. Pulmonary vasculature is largely obscured. Heart size is mildly enlarged. Upper mediastinal  contours are unremarkable. Status post median sternotomy for CABG. IMPRESSION: 1. The appearance of the chest is suggestive of multilobar bronchopneumonia, although some degree of underlying pulmonary edema is not excluded. 2. Small bilateral pleural effusions. 3. Mild cardiomegaly. Electronically Signed   By: Trudie Reed M.D.   On: 02/21/2023 06:41     Assessment/Plan:  PD related peritonitis - peritoneal fluid cx have been negative, but did have +polymicrobial bacteremia -suspect translocation. Will continue on micafungin plus unasyn  ESRD on PD s/p removal on 8/5. Also had temp catheter removed on 8/6 due to being placed during bacteremia/fungemia - scheduled for next HD tomorrow.-does have AVF that maybe possible to use  New low grade fevers = cxr from 8/7 showing congestion vs. New infiltrates c/w pneumonia- continue with unasyn for pneumonia coverage. Consider adding vancomycin back, though he is not colonized with MRSA  Transaminitis= from hypotension/shock - steadily improving   Fungemia = c.glabrata appears transient. Only isolated on 1 of 5 sets of blood cx. Repeat blood cx on 8/5 NGTD but still too early to declare if cleared. Continue with micafungin  Staph haemolyticus and enterococcal bacteremia = continue on amp/sub  [facility] Pager: [phone]  02/21/2023, 4:33 PM

## 2023-02-21 NOTE — Progress Notes (Signed)
IVT consult placed for additional access. Patient has 1 functional PIV + HD cath with pigtail. Due to change in acuity, line holiday is requested and HD cath to be removed. PIV was flushed and assessed; line clamped and ready to be used. USGPIV attempted x1 in right forearm. Unable to fully thread catheter.   Of note, 'restricted extremity' band on left arm. No fistula or PICC line present on left arm. BP cuff was present on left upper arm. Primary RN inquiring about arm band and why arm is restricted. Due to low GFR, unable to place midline at this time without nephrology sign off.   Discussed with primary RN that IVT avoids placing "just in case" peripheral IV's due to limited peripheral access, increased risk for vascular damage, infection and lack of use. Current med list requires only 1 PIV at this time, however if needs change new IVT consult can be placed. Primary RN to reach out to primary team to determine next steps.

## 2023-02-21 NOTE — Progress Notes (Signed)
Subjective: went to floor yest then back to ICU this am due to confusion  Objective Vital signs in last 24 hours: Vitals:   02/21/23 0945 02/21/23 1000 02/21/23 1050 02/21/23 1125  BP: 114/79 111/74 119/74   Pulse: 91 98 (!) 113   Resp: (!) 29 (!) 24 (!) 21   Temp:    99.5 F (37.5 C)  TempSrc:    Oral  SpO2: 96% (!) 87% 94%   Weight:      Height:       Weight change: -3.7 kg  Physical Exam: General: ill appearing , states he is at "[employer]" Heart: RRR no MRG Lungs: no coughing, no rhonchi, no rales Abdomen: soft, obese, PD cath has been removed Extremities: No pedal edema LUA AVF +positive thrill  temp HD cath RIJ in place Neuro: as above   Home med --> albuterol, xanax, aspirin, aotrvastatin, clopidogrel, esomeprazole, gabapentin, insulin glargine, linagliptin, midodrine   Outpatient PD orders:  Followed by [facility] (note his PD RN was on vacation  but nephrology called [facility] for orders  [facility] - davita Gerr[facility] - 100kg 5 cycles, 3000 fill vol (note pt states on consult he thinks is 2.5 liters), total time: 10 hrs, last fill 1500 cc Mircera 150 mcg q monthly Venofer 100mg  once a month L AVF +thrill but small + tortuous    CXR 8/05 - IMPRESSION: Persistent bibasilar opacities, which may represent atelectasis or pneumonia.  Assessment/ Plan: Peritonitis PD catheter related peritonitis - initial cell count 2200, 93% PMNs. Pt initially rec'd IV cefepime, vanc and flagyl. PD fluid cx was negative and initial BCx's done 8/01 were negative.  Repeat blood cx's however done 8/04 were + for GPC and fungus (BCID showing Staph and C. Glabrata). ID consulting and abx changed to IV rocephin and micafungin. PD cath removed 8/05 due to fungemia. Per ccm / ID.  ESRD - PD not working 8/2 --> IR placed temp HD catheter  and underwent HD 8/3.  PD cath removed 8/05. Temp cath to be removed today for line holiday. Had AVF made in 2018, not sure if/  when it was used.  AVF is of reasonable size - will plan to stick AVF for dialysis tomorrow, hopefully it will work.  HD access - L BC AVF created in 2018, w/ f/u visits starting ready to use. Not sure if/ when it was used, could not reach family today. Reasonable size AVF on exam, will plan HD using AVF. If doesn't work will need replacement of temp cath (vs TDC).  Metabolic encephalopathy - due to sepsis from peritonitis, intermittent Acute hypoxic resp failure - extubated, much better BP/volume - BP's a bit soft in 100s, at his dry wt, euvolemic on exam.  Transaminitis - low BP's possibly Anemia esrd - Hb 10.6 <12.2 no ESA needed currently monitor Secondary hyperparathyroidism -corrected calcium 10, no vitamin D, phosphorus 8.2, Renvela binder   Rob   MD  [facility] 02/21/2023, 12:18 PM  Recent Labs  Lab 02/16/23 0117 02/17/23 0351 02/19/23 0458 02/20/23 0126 02/20/23 0538 02/21/23 0407  HGB 10.6*   < > 9.4*   < > 8.7* 8.6*  ALBUMIN <1.5*   < > <1.5*  --   --  <1.5*  CALCIUM 7.2*   < > 7.8*  --  6.5* 7.4*  PHOS 8.2*  --   --   --  5.7*  --   CREATININE 14.71*   < > 11.66*  --  11.53* 8.88*  K 4.3   < > 3.4*   < > 3.1* 4.0   < > = values in this interval not displayed.    Inpatient medications:  aspirin EC  81 mg Oral Daily   Chlorhexidine Gluconate Cloth  6 each Topical Q0600   clopidogrel  75 mg Oral Daily   heparin  5,000 Units Subcutaneous Q8H   insulin aspart  0-5 Units Subcutaneous QHS   insulin aspart  0-6 Units Subcutaneous TID WC   ipratropium-albuterol  3 mL Nebulization BID   midodrine  5 mg Oral TID WC   mometasone-formoterol  2 puff Inhalation BID   pantoprazole  40 mg Oral BID   potassium chloride  20 mEq Oral Once   sevelamer carbonate  2,400 mg Oral TID WC   sodium chloride flush  3 mL Intravenous Q12H    ampicillin-sulbactam (UNASYN) IV 3 g (02/21/23 1110)   micafungin (MYCAMINE) 100 mg in sodium chloride 0.9 % 100 mL IVPB Stopped (02/21/23 0255)    acetaminophen, albuterol, fentaNYL (SUBLIMAZE) injection, heparin sodium (porcine) 3,000 Units in dialysis solution 2.5% low-MG/low-CA 6,000 mL dialysis solution, HYDROmorphone, ondansetron (ZOFRAN) IV         Labs: Basic Metabolic Panel: Recent Labs  Lab 02/16/23 0117 02/17/23 0351 02/19/23 0458 02/20/23 0126 02/20/23 0538 02/21/23 0407  NA 130*   < > 135 133* 137 135  K 4.3   < > 3.4* 3.8 3.1* 4.0  CL 91*   < > 94*  --  99 94*  CO2 19*   < > 29  --  23 26  GLUCOSE 284*   < > 109*  --  89 81  BUN 57*   < > 55*  --  57* 34*  CREATININE 14.71*   < > 11.66*  --  11.53* 8.88*  CALCIUM 7.2*   < > 7.8*  --  6.5* 7.4*  PHOS 8.2*  --   --   --  5.7*  --    < > = values in this interval not displayed.   Liver Function Tests: Recent Labs  Lab 02/18/23 0425 02/19/23 0458 02/21/23 0407  AST 520* 248* 75*  ALT 594* 359* 178*  ALKPHOS 142* 99 87  BILITOT 1.0 1.2 1.2  PROT 5.1* 5.0* 5.1*  ALBUMIN <1.5* <1.5* <1.5*   Recent Labs  Lab 02/15/23 0006  LIPASE 173*   Recent Labs  Lab 02/15/23 0827  AMMONIA 16   CBC: Recent Labs  Lab 02/17/23 0351 02/17/23 2332 02/18/23 0425 02/19/23 0458 02/20/23 0126 02/20/23 0538 02/21/23 0407  WBC 6.7 5.4 5.1 5.0  --  6.6 8.7  NEUTROABS 5.7  --  4.6 4.7  --   --   --   HGB 10.4* 10.3* 9.8* 9.4* 9.5* 8.7* 8.6*  HCT 32.3* 30.9* 29.8* 28.9* 28.0* 26.4* 26.3*  MCV 83.9 81.5 83.9 83.8  --  83.5 84.0  PLT 139* 125* 126*  116* 104*  --  107* 127*   Cardiac Enzymes: Recent Labs  Lab 02/15/23 1110  CKTOTAL 335   CBG: Recent Labs  Lab 02/20/23 2137 02/21/23 0741 02/21/23 0804 02/21/23 1116 02/21/23 1123  GLUCAP 77 68* 130* 82 92    Studies/Results: DG CHEST PORT 1 VIEW  Result Date: 02/21/2023 CLINICAL DATA:  72 year old male with history of tachypnea. EXAM: PORTABLE CHEST 1 VIEW COMPARISON:  Chest x-ray 02/19/2023. FINDINGS: Right internal jugular central venous catheter with tip terminating in the right atrium.  Lung volumes are  very low. Patchy areas of interstitial prominence, diffuse peribronchial cuffing, and ill-defined airspace disease noted throughout the lungs bilaterally, most evident in the mid to lower lungs. Small bilateral pleural effusions. No pneumothorax. Pulmonary vasculature is largely obscured. Heart size is mildly enlarged. Upper mediastinal contours are unremarkable. Status post median sternotomy for CABG. IMPRESSION: 1. The appearance of the chest is suggestive of multilobar bronchopneumonia, although some degree of underlying pulmonary edema is not excluded. 2. Small bilateral pleural effusions. 3. Mild cardiomegaly. Electronically Signed   By: Trudie Reed M.D.   On: 02/21/2023 06:41   ECHOCARDIOGRAM COMPLETE  Result Date: 02/19/2023    ECHOCARDIOGRAM REPORT   Patient Name:   DEMARRIUS OVERALL Date of Exam: 02/19/2023 Medical Rec #:  [id number]      Height:       66.0 in Accession #:    [id number]     Weight:       219.8 lb Date of Birth:  [birth year]     BSA:          2.082 m Patient Age:    71 years       BP:           110/93 mmHg Patient Gender: M              HR:           74 bpm. Exam Location:  Inpatient Procedure: 2D Echo, Color Doppler, Cardiac Doppler and Intracardiac            Opacification Agent Indications:     Endocarditis  History:         Patient has prior history of Echocardiogram examinations, most                  recent 05/31/2022. Previous Myocardial Infarction and CAD,                  COPD, Arrythmias:NSTEMI; Risk Factors:Sleep Apnea, Diabetes and                  Hypertension.  Sonographer:     Raeford Razor Referring Phys:  Levy Pupa, S Diagnosing Phys: [facility]  Sonographer Comments: Technically difficult study due to poor echo windows. Image acquisition challenging due to patient body habitus. IMPRESSIONS  1. Akinesis of the anterior /anteroseptal/inferoseptal wall from base to apex. Left ventricular ejection fraction, by estimation, is 40 to 45%. The left ventricle has  mildly decreased function. Left ventricular diastolic parameters are indeterminate.  2. Right ventricular systolic function is moderately reduced. The right ventricular size is normal.  3. Left atrial size was mildly dilated.  4. The mitral valve was not well visualized. No evidence of mitral valve regurgitation.  5. The aortic valve was not well visualized. Aortic valve regurgitation is not visualized. Mild aortic valve stenosis.  6. The inferior vena cava is normal in size with greater than 50% respiratory variability, suggesting right atrial pressure of 3 mmHg. Comparison(s): No significant change from prior study. FINDINGS  Left Ventricle: Akinesis of the anterior /anteroseptal/inferoseptal wall from base to apex. Left ventricular ejection fraction, by estimation, is 40 to 45%. The left ventricle has mildly decreased function. Definity contrast agent was given IV to delineate the left ventricular endocardial borders. The left ventricular internal cavity size was normal in size. There is no left ventricular hypertrophy. Left ventricular diastolic parameters are indeterminate. Right Ventricle: The right ventricular size is normal. Right ventricular systolic function is moderately reduced. Left Atrium: Left atrial  size was mildly dilated. Right Atrium: Right atrial size was normal in size. Pericardium: There is no evidence of pericardial effusion. Mitral Valve: The mitral valve was not well visualized. No evidence of mitral valve regurgitation. Tricuspid Valve: Tricuspid valve regurgitation is mild. Aortic Valve: The aortic valve was not well visualized. Aortic valve regurgitation is not visualized. Mild aortic stenosis is present. Aortic valve mean gradient measures 15.0 mmHg. Aortic valve peak gradient measures 27.2 mmHg. Aortic valve area, by VTI  measures 1.57 cm. Pulmonic Valve: Pulmonic valve regurgitation is not visualized. Aorta: The aortic root and ascending aorta are structurally normal, with no evidence  of dilitation. Venous: The inferior vena cava is normal in size with greater than 50% respiratory variability, suggesting right atrial pressure of 3 mmHg. IAS/Shunts: No atrial level shunt detected by color flow Doppler.  LEFT VENTRICLE PLAX 2D LVIDd:         5.20 cm     Diastology LVIDs:         3.80 cm     LV e' medial:    10.70 cm/s LV PW:         1.00 cm     LV E/e' medial:  8.5 LV IVS:        1.00 cm     LV e' lateral:   8.49 cm/s LVOT diam:     2.00 cm     LV E/e' lateral: 10.7 LV SV:         68 LV SV Index:   33 LVOT Area:     3.14 cm  LV Volumes (MOD) LV vol d, MOD A2C: 95.4 ml LV vol d, MOD A4C: 49.6 ml LV vol s, MOD A2C: 47.0 ml LV vol s, MOD A4C: 24.7 ml LV SV MOD A2C:     48.4 ml LV SV MOD A4C:     49.6 ml LV SV MOD BP:      33.3 ml RIGHT VENTRICLE            IVC RV Basal diam:  3.30 cm    IVC diam: 1.20 cm RV S prime:     6.31 cm/s TAPSE (M-mode): 1.7 cm LEFT ATRIUM           Index        RIGHT ATRIUM           Index LA diam:      3.90 cm 1.87 cm/m   RA Area:     18.80 cm LA Vol (A4C): 82.5 ml 39.63 ml/m  RA Volume:   49.40 ml  23.73 ml/m  AORTIC VALVE AV Area (Vmax):    1.36 cm AV Area (Vmean):   1.26 cm AV Area (VTI):     1.57 cm AV Vmax:           261.00 cm/s AV Vmean:          181.000 cm/s AV VTI:            0.431 m AV Peak Grad:      27.2 mmHg AV Mean Grad:      15.0 mmHg LVOT Vmax:         113.00 cm/s LVOT Vmean:        72.600 cm/s LVOT VTI:          0.216 m LVOT/AV VTI ratio: 0.50  AORTA Ao Root diam: 3.00 cm Ao Asc diam:  3.10 cm MITRAL VALVE MV Area (PHT): 5.84 cm    SHUNTS MV Decel Time:  130 msec    Systemic VTI:  0.22 m MV E velocity: 91.20 cm/s  Systemic Diam: 2.00 cm Carolan Clines Electronically signed by Carolan Clines Signature Date/Time: 02/19/2023/6:44:33 PM    Final (Updated)    Medications:  ampicillin-sulbactam (UNASYN) IV 3 g (02/21/23 1110)   micafungin (MYCAMINE) 100 mg in sodium chloride 0.9 % 100 mL IVPB Stopped (02/21/23 0255)    aspirin EC  81 mg Oral Daily    Chlorhexidine Gluconate Cloth  6 each Topical Q0600   clopidogrel  75 mg Oral Daily   heparin  5,000 Units Subcutaneous Q8H   insulin aspart  0-5 Units Subcutaneous QHS   insulin aspart  0-6 Units Subcutaneous TID WC   ipratropium-albuterol  3 mL Nebulization BID   midodrine  5 mg Oral TID WC   mometasone-formoterol  2 puff Inhalation BID   pantoprazole  40 mg Oral BID   potassium chloride  20 mEq Oral Once   sevelamer carbonate  2,400 mg Oral TID WC   sodium chloride flush  3 mL Intravenous Q12H

## 2023-02-21 NOTE — Consult Note (Signed)
CC: Fungemia, evaluate for endophthalmitis   HPI: [name] [name] is a 72 y.o. male w/  ESRD on peritoneal dialysis who had recent positive blood cultures. Ophthalmology consulted to evaluate for endophthalmitis OU. Pt alert and able to answer questions, however family is helpful with history. Denies any recent vision changes or new flashing lights/floaters or eye pain/redness. Wears readers for near vision.   ROS: Negative except as otherwise stated.  PMH: Past Medical History:  Diagnosis Date   Anemia    Arthritis    Asthma    Bell palsy    CAD (coronary artery disease)    a. 2014 MV: abnl w/ infap ischemia; b. 03/2013 Cath: aneurysmal bleb in the LAD w/ otw nonobs dzs-->Med Rx.   Chronic back pain    Chronic knee pain    a. 09/2015 s/p R TKA.   Chronic pain    Chronic shoulder pain    Chronic sinusitis    COPD (chronic obstructive pulmonary disease) (HCC)    Diabetes mellitus without complication (HCC)    type II    ESRD on peritoneal dialysis (HCC)    on peritoneal dialysis, [facility]   Essential hypertension    GERD (gastroesophageal reflux disease)    Gout    Gout    Hepatomegaly    noted on noncontrast CT 2015   History of hiatal hernia    Hyperlipidemia    Lateral meniscus tear    Obesity    Truncal   Obstructive sleep apnea    does not use cpap    On home oxygen therapy    uses 2l when is going somewhere per patient    PUD (peptic ulcer disease)    remote, reports f/u EGD about 8 years ago unremarkable    Reactive airway disease    related to exposure to chemical during 9/11   Sinusitis    Vitamin D deficiency     PSH: Past Surgical History:  Procedure Laterality Date   ASAD LT SHOULDER  12/15/2008   left shoulder   AV FISTULA PLACEMENT Left 08/09/2016   Procedure: BRACHIOCEPHALIC ARTERIOVENOUS (AV) FISTULA CREATION LEFT ARM;  Surgeon: Sherren Kerns, MD;  Location: [facility] OR;  Service: Vascular;  Laterality: Left;   CAPD INSERTION N/A 10/07/2018    Procedure: LAPAROSCOPIC PERITONEAL CATHETER PLACEMENT;  Surgeon: Rodman Pickle, MD;  Location: [facility] ORS;  Service: General;  Laterality: N/A;   CATARACT EXTRACTION W/PHACO Left 03/28/2016   Procedure: CATARACT EXTRACTION PHACO AND INTRAOCULAR LENS PLACEMENT LEFT EYE;  Surgeon: Jethro Bolus, MD;  Location: [facility] ORS;  Service: Ophthalmology;  Laterality: Left;  CDE: 4.77   CATARACT EXTRACTION W/PHACO Right 04/11/2016   Procedure: CATARACT EXTRACTION PHACO AND INTRAOCULAR LENS PLACEMENT RIGHT EYE; CDE:  4.74;  Surgeon: Jethro Bolus, MD;  Location: [facility] ORS;  Service: Ophthalmology;  Laterality: Right;   COLONOSCOPY  10/15/2008   Fields: Rectal polyp obliterated, not retrieved, hemorrhoids, single ascending colon diverticulum near the CV. Next colonoscopy April 2020   COLONOSCOPY N/A 12/25/2014   SLF: 1. Colorectal polyps (2) removed 2. Small internal hemorrhoids 3. the left colon is severely redundant. hyperplastic polyps   CORONARY ARTERY BYPASS GRAFT N/A 06/05/2022   Procedure: OFF PUMP CORONARY ARTERY BYPASS GRAFTING (CABG) X 2 BYPASSES USING LEFT INTERNAL MAMMARY ARTERY AND RIGHT LEG GREATER SAPHENOUS VEIN HARVESTED ENDOSCOPICALLY;  Surgeon: Corliss Skains, MD;  Location: [facility] OR;  Service: Open Heart Surgery;  Laterality: N/A;   CORONARY STENT INTERVENTION N/A 07/25/2021  Procedure: CORONARY STENT INTERVENTION;  Surgeon: Tonny Bollman, MD;  Location: [facility] INVASIVE CV LAB;  Service: Cardiovascular;  Laterality: N/A;   CORONARY STENT INTERVENTION N/A 12/26/2021   Procedure: CORONARY STENT INTERVENTION;  Surgeon: Yvonne Kendall, MD;  Location: [facility] INVASIVE CV LAB;  Service: Cardiovascular;  Laterality: N/A;   CORONARY STENT INTERVENTION N/A 01/20/2022   Procedure: CORONARY STENT INTERVENTION;  Surgeon: Tonny Bollman, MD;  Location: [facility] INVASIVE CV LAB;  Service: Cardiovascular;  Laterality: N/A;   DOPPLER ECHOCARDIOGRAPHY     ESOPHAGOGASTRODUODENOSCOPY N/A 12/25/2014   SLF: 1. Anemia most  likely due to CRI, gastritis, gastric polyps 2. Moderate non-erosive gastriits and mild duodenitis.  3.TWo large gstric polyps removed.    EYE SURGERY  12/22/2010   tear duct probing-[place]   FOREIGN BODY REMOVAL  03/29/2011   Procedure: REMOVAL FOREIGN BODY EXTREMITY;  Surgeon: Fuller Canada, MD;  Location: [facility];  Service: Orthopedics;  Laterality: Right;  Removal Foreign Body Right Thumb   IR FLUORO GUIDE CV LINE RIGHT  08/06/2018   IR FLUORO GUIDE CV LINE RIGHT  02/17/2023   IR US GUIDE VASC ACCESS RIGHT  08/06/2018   IR US GUIDE VASC ACCESS RIGHT  02/17/2023   KNEE ARTHROSCOPY  10/16/2007   left   KNEE ARTHROSCOPY WITH LATERAL MENISECTOMY Right 10/14/2015   Procedure: LEFT KNEE ARTHROSCOPY WITH PARTIAL LATERAL MENISECTOMY;  Surgeon: Vickki Hearing, MD;  Location: [facility];  Service: Orthopedics;  Laterality: Right;   LEFT HEART CATH AND CORONARY ANGIOGRAPHY N/A 07/25/2021   Procedure: LEFT HEART CATH AND CORONARY ANGIOGRAPHY;  Surgeon: Tonny Bollman, MD;  Location: [facility] INVASIVE CV LAB;  Service: Cardiovascular;  Laterality: N/A;   LEFT HEART CATH AND CORONARY ANGIOGRAPHY N/A 12/26/2021   Procedure: LEFT HEART CATH AND CORONARY ANGIOGRAPHY;  Surgeon: Yvonne Kendall, MD;  Location: [facility] INVASIVE CV LAB;  Service: Cardiovascular;  Laterality: N/A;   LEFT HEART CATH AND CORONARY ANGIOGRAPHY N/A 01/20/2022   Procedure: LEFT HEART CATH AND CORONARY ANGIOGRAPHY;  Surgeon: Tonny Bollman, MD;  Location: [facility] INVASIVE CV LAB;  Service: Cardiovascular;  Laterality: N/A;   LEFT HEART CATHETERIZATION WITH CORONARY ANGIOGRAM N/A 03/28/2013   Procedure: LEFT HEART CATHETERIZATION WITH CORONARY ANGIOGRAM;  Surgeon: Marykay Lex, MD;  Location: [facility] CATH LAB;  Service: Cardiovascular;  Laterality: N/A;   NM MYOVIEW LTD     PENILE PROSTHESIS IMPLANT N/A 08/16/2015   Procedure: PENILE PROTHESIS INFLATABLE, three piece, Excisional biopsy of Penile ulcer, Penile molding;  Surgeon: Jethro Bolus, MD;   Location: [facility] ORS;  Service: Urology;  Laterality: N/A;   PENILE PROSTHESIS IMPLANT N/A 12/24/2017   Procedure: REMOVAL AND  REPLACEMENT  COLOPLAST PENILE PROSTHESIS;  Surgeon: Crista Elliot, MD;  Location: [facility] ORS;  Service: Urology;  Laterality: N/A;   PORT-A-CATH REMOVAL N/A 02/19/2023   Procedure: REMOVAL OF PERITONEAL DIALYSIS CATHETER;  Surgeon: Victorino Sparrow, MD;  Location: [facility] OR;  Service: Vascular;  Laterality: N/A;   QUADRICEPS TENDON REPAIR  07/21/2011   Procedure: REPAIR QUADRICEP TENDON;  Surgeon: Fuller Canada, MD;  Location: [facility];  Service: Orthopedics;  Laterality: Right;   RIGHT/LEFT HEART CATH AND CORONARY ANGIOGRAPHY N/A 05/30/2022   Procedure: RIGHT/LEFT HEART CATH AND CORONARY ANGIOGRAPHY;  Surgeon: Corky Crafts, MD;  Location: [facility] INVASIVE CV LAB;  Service: Cardiovascular;  Laterality: N/A;   TEE WITHOUT CARDIOVERSION N/A 06/05/2022   Procedure: TRANSESOPHAGEAL ECHOCARDIOGRAM (TEE);  Surgeon: Corliss Skains, MD;  Location: [facility] OR;  Service: Open Heart Surgery;  Laterality: N/A;  TOENAIL EXCISION     removed x2-bilateral   UMBILICAL HERNIA REPAIR  07/17/2005   [place]    Meds: No gtts  Past Ocular History: Pseudophakia OU (Dr. Nile Riggs) in 2017  Last Eye Exam: Nov 2023 (Dr. Nile Riggs)  Primary Eye Care: Dr. Nile Riggs  Exam:  Extraocular Motility:  Full ductions and versions, both eyes  External:   Normal Symmetry, V1-3 intact, infraorbital nerve appears intact.   Pupils  OD: 4mm to 3mm reactive without afferent pupillary defect (APD)  OS: 4mm to 3mm reactive without afferent pupillary defect (APD)   Slit Lamp Exam:  Lids/Lashes  OD: Normal Lids and lashes, No lesion or injury  OS: Normal lids and lashes, nor lesion or injury  Conjunctiva/Sclera  OD: White and quiet  OS: White and quiet  Cornea  OD: Clear without abrasion or defect  OS: Clear without abrasion or defect  Anterior Chamber  OD: Deep and quiet  OS: Deep and  quiet  Iris  OD: Normal iris architecture  OS: Normal Iris Architecture   Lens  OD: PCIOL  OS: PCIOL  Anterior Vitreous  OD: Clear, without cell  OS: Clear without cell   POSTERIOR POLE EXAM (Dilated with phenylephrine and tropicamide.  Dilation may last up to 24 hours) Vitreous:   OD: Clear, no cell  OS: Clear, no cell Disc:   OD: flat, sharp margin, with appropriate color  OS: flat, sharp margin, with appropriate color Macula  OD: Flat, with appropriate light reflex  OS: Flat with appropriate light reflex Vessels  OD: Normal vasculature  OS: Normal vasculature Periphery  OD: Flat 360 degrees without tear, hole or detachment  OS: Flat 360 degrees without tear, hole or detachment  Assessment and Plan:  Positive fungal blood cultures without signs of intraocular infection/endophthalmitis OU. Agree with continued antibiotic coverage and may f/u as an outpatient with his primary eye doctor, Dr. Nile Riggs, for any vision changes.   Arnette Felts, MD Ophthalmology [facility] [phone]

## 2023-02-21 NOTE — Progress Notes (Signed)
[name] [name]  JSE:[id number] DOB: 1951-01-12 DOA: 02/14/2023 PCP: Benetta Spar, MD    Brief Narrative:  72 year old with a history of CABG November 2023, COPD, DM2, GERD, chronic hypotension on midodrine, and ESRD on PD who was brought to the [facility] ER by his wife 02/15/2023 with significant confusion and progressive shortness of breath over a few days.  He was found to have sepsis secondary to peritoneal dialysis associated catheter peritonitis and polymicrobial bacteremia/fungemia.  Hospital course complicated by worsening encephalopathy requiring transfer to the ICU on 8/4-he was stabilized and transferred back to the floor, however on 8/7-patient developed worsening hypoxia, encephalopathy-and was transferred back to the ICU.   Significant Events: 7/31-8/1>> admit via [facility] ED for AMS - transferred to [facility]  8/3>> malfunction of PD catheter - R IJ temporary HD cath placed with HD performed 8/4>>decline in status post HD prompting PCCM consultation 8/5>> blood cultures positive for Candida glabrata and staph species 8/5>> PD catheter removed in OR 8/7>> encephalopathic/severe abdominal pain-not keeping his oxygen on-ICU consult as patient on 15 L of oxygen.    Goals of Care:   Code Status: Full Code   DVT prophylaxis: heparin injection 5,000 Units Start: 02/15/23 1745  Interim Hx: Restless-confused-not keeping his oxygen on-on 15 L of oxygen-down to the 70s when he is off oxygen.  Very tender abdomen all over.  Assessment & Plan: Acute metabolic encephalopathy Worsening confusion overnight-suspect this is provoked by sepsis physiology Unfortunately-he is also very hypoxic and requiring 15 L of HFNC-due to encephalopathy-he is very restless and unable to keep his oxygen on-PCCM consulted with plans to transfer back to the ICU for close monitoring.  Severe sepsis secondary to PD associated catheter peritonitis with Candida glabrata fungemia, enterococcal Avium/staph hemolyticus  bacteremia. Worsening encephalopathy-diffusely tender abdomen this morning Stat CT abdomen/pelvis ordered-to ensure no perforation On Unasyn/micafungin-ID following Note-PD catheter removed in the OR on 8/5 Currently no central access-line holiday in progress. TTE 8/5-without any obvious endocarditis Defer to ID if TEE is necessary.  Acute hypoxic respiratory failure Suspect due to combination of multifocal pneumonia (?septic emboli) and some amount of volume overload Hypoxia seems to have worsened overnight-very restless/encephalopathic-not keeping his oxygen on.  Thankfully protecting airway otherwise. On 15 L of HFNC Being transferred to the ICU-see above  ESRD on home peritoneal dialysis Has been transitioned to HD Nephrology following.  Transaminitis -shock liver LFTs improving  CAD status post CABG November 2023 -demand myocardial ischemia Chronic LBBB on EKG - chest pain-free  DAPT   Hypokalemia Repleted  Hypomagnesemia Repleted  DM2 SSI but slightly hypoglycemic this morning-follow closely.  Recent Labs    02/20/23 2137 02/21/23 0741 02/21/23 0804  GLUCAP 77 68* 130*     Obesity - Body mass index is 34.66 kg/m.   Family Communication: None at side Disposition: Back to the ICU   Objective: Blood pressure (!) 104/58, pulse 77, temperature (!) 100.7 F (38.2 C), temperature source Axillary, resp. rate 19, height 5\' 6"  (1.676 m), weight 97.4 kg, SpO2 99%.  Intake/Output Summary (Last 24 hours) at 02/21/2023 0843 Last data filed at 02/20/2023 1843 Gross per 24 hour  Intake --  Output -3338.44 ml  Net 3338.44 ml   Filed Weights   02/20/23 1608 02/21/23 0500 02/21/23 0823  Weight: (S) 96.6 kg 97.8 kg 97.4 kg    Examination: Gen Exam: Confused-restless HEENT:atraumatic, normocephalic Chest: B/L clear to auscultation anteriorly CVS:S1S2 regular Abdomen: Soft-diffusely tender all over. Extremities:no edema Neurology: Non focal Skin: no  rash  CBC: Recent Labs  Lab 02/17/23 0351 02/17/23 2332 02/18/23 0425 02/19/23 0458 02/20/23 0126 02/20/23 0538 02/21/23 0407  WBC 6.7   < > 5.1 5.0  --  6.6 8.7  NEUTROABS 5.7  --  4.6 4.7  --   --   --   HGB 10.4*   < > 9.8* 9.4* 9.5* 8.7* 8.6*  HCT 32.3*   < > 29.8* 28.9* 28.0* 26.4* 26.3*  MCV 83.9   < > 83.9 83.8  --  83.5 84.0  PLT 139*   < > 126*  116* 104*  --  107* 127*   < > = values in this interval not displayed.   Basic Metabolic Panel: Recent Labs  Lab 02/16/23 0117 02/17/23 0351 02/19/23 0458 02/20/23 0126 02/20/23 0538 02/21/23 0407  NA 130*   < > 135 133* 137 135  K 4.3   < > 3.4* 3.8 3.1* 4.0  CL 91*   < > 94*  --  99 94*  CO2 19*   < > 29  --  23 26  GLUCOSE 284*   < > 109*  --  89 81  BUN 57*   < > 55*  --  57* 34*  CREATININE 14.71*   < > 11.66*  --  11.53* 8.88*  CALCIUM 7.2*   < > 7.8*  --  6.5* 7.4*  MG  --    < > 1.8  --  1.6* 2.0  PHOS 8.2*  --   --   --  5.7*  --    < > = values in this interval not displayed.   GFR: Estimated Creatinine Clearance: 8.2 mL/min (A) (by C-G formula based on SCr of 8.88 mg/dL (H)).   Scheduled Meds:  aspirin EC  81 mg Oral Daily   Chlorhexidine Gluconate Cloth  6 each Topical Q0600   clopidogrel  75 mg Oral Daily   dextrose       heparin  5,000 Units Subcutaneous Q8H   insulin aspart  0-5 Units Subcutaneous QHS   insulin aspart  0-6 Units Subcutaneous TID WC   insulin glargine-yfgn  10 Units Subcutaneous Daily   iohexol  500 mL Oral Q1H   ipratropium-albuterol  3 mL Nebulization TID   midodrine  5 mg Oral TID WC   mometasone-formoterol  2 puff Inhalation BID   pantoprazole  40 mg Oral BID   potassium chloride  20 mEq Oral Once   sevelamer carbonate  2,400 mg Oral TID WC   sodium chloride flush  3 mL Intravenous Q12H   Continuous Infusions:  ampicillin-sulbactam (UNASYN) IV     micafungin (MYCAMINE) 100 mg in sodium chloride 0.9 % 100 mL IVPB 100 mg (02/21/23 0155)     LOS: 6 days   [place] [place] [facility] Office  [phone] Pager - Text Page per Loretha Stapler  If 7PM-7AM, please contact night-coverage per Amion 02/21/2023, 8:43 AM

## 2023-02-21 NOTE — Consult Note (Signed)
NAME:  [name] [name], MRN:  [id number], DOB:  1950-08-01, LOS: 6 ADMISSION DATE:  02/14/2023, CONSULTATION DATE:  02/18/23 REFERRING MD:  Reggie Pile of [facility] X-cver, CHIEF COMPLAINT:  SIRS, sepsis, encephalopathy resp distress    History of Present Illness:   72 year old BMI 35.5 obese male with a history of bypass in 2023, COPD not otherwise specified, type 2 diabetes mellitus, acid reflux, ESRD on peritoneal dialysis, hypotension on midodrine.  Sometime mid June 2024 had COVID-19 and treated with Augmentin.  Subsequent to that had cough and poor oral intake and lethargy.  Despite t his mid juoy 2-24 - 1000 feeet in 5 min on room air without desaturation.   Presented to [facility] on 02/14/2023 for confusion and worsening shortness of breath and hypersomnolence.  At the time of admission is requiring 2 L nasal cannula and he had transaminitis.  Chest x-ray suggested pneumonia and a procalcitonin over 150 with a fever of 102.  Started on broad antibiotics and transferred to [facility] campus.  Working diagnosis was peritonitis from his peritoneal dialysis catheter based on cell count 2200 and hazy fluid in the peritoneal dialysate  Subsequent course - 02/16/2023 felt to be responding to antibiotics more awake.  Nursing noticed him to be ambulatory to the bathroom.  Home Xanax and Neurontin were being held.  For his symptomatic transaminitis he had right upper quadrant ultrasound that suggested cirrhosis.  He did have peritoneal dialysis on 02/16/2023  -02/17/2023: Malfunction of peritoneal dialysis catheter with complaints of abdominal pain.  Has subsequent status post temporary HD catheter right internal jugular vein.  He then underwent emergent dialysis and returned back to the unit at around 10 PM 02/17/2023.  Nursing noticed marked difference.  He was less interactive.  He was alert and having a gaze but not following commands.  He would mumble a word.  His temperature was  101.5.  Tachypneic having tremors oxygen needs now at 5 L [.  Hemodialysis was at 4 L] and saturating 100% status post -1.2 L on hemodialysis.  Chest x-ray done showed worsening pulm infiltrates particularly lower lobes compared to admission.  Critical care medicine consulted.  By hospitalist.   Significant Hospital Events:  -02/14/2023 - admit -02/16/2023 felt to be responding to antibiotics more awake.  Nursing noticed him to be ambulatory to the bathroom.  Home Xanax and Neurontin were being held.  For his symptomatic transaminitis he had right upper quadrant ultrasound that suggested cirrhosis.  He did have peritoneal dialysis on 02/16/2023 -02/17/2023: Malfunction of peritoneal dialysis catheter with complaints of abdominal pain.  Has subsequent status post temporary HD catheter right internal jugular vein.  He then underwent emergent dialysis and returned back to the unit at around 10 PM 02/17/2023.  Nursing noticed marked difference.  He was less interactive.  He was alert and having a gaze but not following commands.  He would mumble a word.  His temperature was 101.5.  Tachypneic having tremors oxygen needs now at 5 L [.  Hemodialysis was at 4 L] and saturating 100% status post -1.2 L on hemodialysis.  Chest x-ray done showed worsening pulm infiltrates particularly lower lobes compared to admission.  Critical care medicine consulted.   -02/18/23: early hours - CCM consult -8/5 blood cultures >> Candida glabrata, staph species -8/5 PD catheter removed by vascular surgery -8/6 micafungin.  Ceftriaxone changed to Unasyn based on culture data -8/7 back to ICU w encephalopathy   Interim History / Subjective:  More confusion reported beginning afternoon 8/6, certainly worse post HD evening 8/6.  Not leaving on his oxygen, complaining of arm pain and abdominal pain Desaturations as low as 70s on room air No BiPAP overnight  Objective   Blood pressure (!) 104/58, pulse 87, temperature (!) 100.6 F (38.1  C), temperature source Axillary, resp. rate (!) 32, height 5\' 6"  (1.676 m), weight 97.8 kg, SpO2 95%.        Intake/Output Summary (Last 24 hours) at 02/21/2023 0736 Last data filed at 02/20/2023 1843 Gross per 24 hour  Intake --  Output -3338.44 ml  Net 3338.44 ml   Filed Weights   02/20/23 0423 02/20/23 1608 02/21/23 0500  Weight: 100.3 kg (S) 96.6 kg 97.8 kg    Examination: General: Obese man, uncomfortable and slightly agitated in bed HENT: Strong voice, oropharynx clear, no secretions Lungs: Scattered bilateral inspiratory crackles.  Comfortable respiratory pattern.  SpO2 85% on room air Cardiovascular: Irregular, distant, no murmur Abdomen: Obese, soft but tender in all quadrants with some rebound.  Positive bowel sounds, PD catheter has been removed Extremities: Trace lower extremity edema Neuro: Awake, interacts but confused, distracted.  Slightly agitated.  Subtle right upper extremity and right facial tremor (baseline) GU: Deferred  Resolved Hospital Problem list   x  Assessment & Plan:   Recurrent acute metabolic and septic encephalopathy.  Concerning for progression of his sepsis, underlying peritonitis.  Note encephalopathy has been worse post HD -Push to treat sepsis as below -Minimize sedating/mind altering medications as able.  He may require some intermittent Ativan or possibly even Precedex to allow care -His home Xanax and gabapentin remain on hold -Correct electrolytes, metabolic disarray with HD.  It would appear that he tolerates volume she has poorly.  This is the second time he has been worse after HD -Move back to the ICU given worsening encephalopathy  Sepsis due to peritonitis, Candida glabrata and multi organism bacteremia (Enterococcus, staph hemolyticus) -Continue micafungin, ceftriaxone was changed to Unasyn on 8/6.  Consider broadening given concern for progressive intra-abdominal process -consider CT abdomen now to evaluate for any progression  peritonitis - may be a difficult interpretation immediately post-op from PD catheter removal.  -PD catheter removed -Needs his HD catheter out today, line holiday.  -TTE done, await final interpretation.  Suspect he will need a TEE depending on results -ultimately will need optho eval given fungemia  Acute hypoxemic respiratory failure with bibasilar infiltrates on CT and chest x-ray.  Overall picture most consistent with pulmonary edema versus ALI.  He was treated empirically for possible pneumonia on presentation.  Question whether he has OSA/OHS -Hypoxemia overnight and this morning, has not worn his oxygen reliably.  Question whether he may require BiPAP nightly long-term -Push pulmonary hygiene -Check chest x-ray now -Careful volume removal with HD per nephrology plans  End-stage renal disease on peritoneal dialysis in [place].  PD catheter out. Insertion of new hemodialysis catheter 8/3, removed 8/6 -Appreciate nephrology management.  Will need to discuss possibility that one of his stressors and reasons for decompensation is his ability to tolerate HD -Agree with line holiday -Follow BMP  Tremor, significantly worse during critical illness, now improved -Stable at this time, continue to follow  Transaminitis, suspect shock liver -Follow LFT and coags   Best practice (daily eval):  Diet: Renal diet Pain/Anxiety/Delirium protocol (if indicated): x VAP protocol (if indicated): x DVT prophylaxis: heparin sq GI prophylaxis:  PPI Glucose control: SSI + semglee Mobility: bed rest Code Status: full  code Family Communication:  Disposition: Transfer back to ICU 8/7   SIGNATURE   Critical care time 60 minutes  Levy Pupa, MD, PhD 02/21/2023, 7:36 AM [facility] [phone] or if no answer before 7:00PM call [phone] For any issues after 7:00PM please call eLink [phone]   LABS    PULMONARY Recent Labs  Lab 02/15/23 0827  02/18/23 0154 02/20/23 0126 02/21/23 0453  PHART  --   --  7.430 7.48*  PCO2ART  --   --  35.7 35  PO2ART  --   --  62* 143*  HCO3 27.6 29.9* 23.7 25.9  TCO2  --   --  25  --   O2SAT 36.8 60.5 92 100    CBC Recent Labs  Lab 02/19/23 0458 02/20/23 0126 02/20/23 0538 02/21/23 0407  HGB 9.4* 9.5* 8.7* 8.6*  HCT 28.9* 28.0* 26.4* 26.3*  WBC 5.0  --  6.6 8.7  PLT 104*  --  107* 127*    COAGULATION Recent Labs  Lab 02/15/23 0827 02/16/23 0117 02/17/23 2332 02/18/23 0425  INR 1.2 1.3* 1.2 1.2    CARDIAC  No results for input(s): "TROPONINI" in the last 168 hours. No results for input(s): "PROBNP" in the last 168 hours.  CHEMISTRY Recent Labs  Lab 02/16/23 0117 02/17/23 0351 02/17/23 2332 02/18/23 0425 02/19/23 0458 02/20/23 0126 02/20/23 0538 02/21/23 0407  NA 130* 131* 133* 132* 135 133* 137 135  K 4.3 4.3 3.3* 3.1* 3.4* 3.8 3.1* 4.0  CL 91* 90* 92* 93* 94*  --  99 94*  CO2 19* 23 25 26 29   --  23 26  GLUCOSE 284* 210* 154* 92 109*  --  89 81  BUN 57* 69* 35* 38* 55*  --  57* 34*  CREATININE 14.71* 15.03* 8.36* 9.61* 11.66*  --  11.53* 8.88*  CALCIUM 7.2* 7.3* 7.3* 7.2* 7.8*  --  6.5* 7.4*  MG  --  1.8  --  1.7 1.8  --  1.6* 2.0  PHOS 8.2*  --   --   --   --   --  5.7*  --    Estimated Creatinine Clearance: 8.2 mL/min (A) (by C-G formula based on SCr of 8.88 mg/dL (H)).   LIVER Recent Labs  Lab 02/15/23 0827 02/16/23 0117 02/17/23 0351 02/17/23 2332 02/18/23 0425 02/19/23 0458 02/21/23 0407  AST 4,152* 2,628* 1,252* 635* 520* 248* 75*  ALT 2,053* 1,431* 1,018* 686* 594* 359* 178*  ALKPHOS 111 99 127* 152* 142* 99 87  BILITOT 1.0 0.8 0.7 1.3* 1.0 1.2 1.2  PROT 5.9* 5.3* 5.2* 5.3* 5.1* 5.0* 5.1*  ALBUMIN 1.9* <1.5* 1.6* 1.6* <1.5* <1.5* <1.5*  INR 1.2 1.3*  --  1.2 1.2  --   --      INFECTIOUS Recent Labs  Lab 02/17/23 2332 02/18/23 0425 02/19/23 0458  LATICACIDVEN 1.6  --   --   PROCALCITON 130.47 132.63 >150.00      ENDOCRINE CBG (last 3)  Recent Labs    02/20/23 0734 02/20/23 1240 02/20/23 2137  GLUCAP 90 107* 77

## 2023-02-21 NOTE — Progress Notes (Signed)
Contacted [facility] and left message for facility administrator requesting a return call to navigator to discuss pt's case. Will await a return call.   Olivia Canter Renal Navigator [phone]

## 2023-02-21 NOTE — Progress Notes (Signed)
Pt refused BIPAP. RT informed pt to tell RN if they want BIPAP anytime during the night. Pt is currently on 5 L [place] with stable vitals. RT will monitor as needed.      02/21/23 1918  BiPAP/CPAP/SIPAP  BiPAP/CPAP/SIPAP Pt Type Adult  BiPAP/CPAP/SIPAP V60  Reason BIPAP/CPAP not in use Non-compliant (Pt refused BIPAP)  Mask Type Full face mask  Mask Size Large  Patient Home Equipment No  Auto Titrate No  BiPAP/CPAP /SiPAP Vitals  Bilateral Breath Sounds Diminished

## 2023-02-22 ENCOUNTER — Other Ambulatory Visit: Payer: Self-pay | Admitting: Gastroenterology

## 2023-02-22 ENCOUNTER — Encounter (HOSPITAL_COMMUNITY): Payer: Medicare Other | Admitting: Occupational Therapy

## 2023-02-22 ENCOUNTER — Other Ambulatory Visit: Payer: Self-pay | Admitting: "Endocrinology

## 2023-02-22 DIAGNOSIS — R6881 Early satiety: Secondary | ICD-10-CM

## 2023-02-22 DIAGNOSIS — K219 Gastro-esophageal reflux disease without esophagitis: Secondary | ICD-10-CM

## 2023-02-22 DIAGNOSIS — G9340 Encephalopathy, unspecified: Secondary | ICD-10-CM | POA: Diagnosis not present

## 2023-02-22 DIAGNOSIS — E1122 Type 2 diabetes mellitus with diabetic chronic kidney disease: Secondary | ICD-10-CM

## 2023-02-22 DIAGNOSIS — B49 Unspecified mycosis: Secondary | ICD-10-CM | POA: Diagnosis not present

## 2023-02-22 DIAGNOSIS — K659 Peritonitis, unspecified: Secondary | ICD-10-CM | POA: Diagnosis not present

## 2023-02-22 LAB — GLUCOSE, CAPILLARY
Glucose-Capillary: 83 mg/dL (ref 70–99)
Glucose-Capillary: 88 mg/dL (ref 70–99)
Glucose-Capillary: 100 mg/dL — ABNORMAL HIGH (ref 70–99)
Glucose-Capillary: 123 mg/dL — ABNORMAL HIGH (ref 70–99)
Glucose-Capillary: 181 mg/dL — ABNORMAL HIGH (ref 70–99)

## 2023-02-22 MED ORDER — HALOPERIDOL LACTATE 5 MG/ML IJ SOLN
5.0000 mg | Freq: Four times a day (QID) | INTRAMUSCULAR | Status: AC | PRN
  Administered 2023-02-22 – 2023-02-23 (×2): 5 mg via INTRAVENOUS
  Filled 2023-02-22 (×2): qty 1

## 2023-02-22 MED ORDER — GERHARDT'S BUTT CREAM
TOPICAL_CREAM | Freq: Four times a day (QID) | CUTANEOUS | Status: DC
  Filled 2023-02-22 (×3): qty 1

## 2023-02-22 MED ORDER — NOREPINEPHRINE 4 MG/250ML-% IV SOLN: 2.0000 ug/min | INTRAVENOUS | Status: DC

## 2023-02-22 MED ORDER — LOPERAMIDE HCL 2 MG PO CAPS
2.0000 mg | ORAL_CAPSULE | ORAL | Status: DC | PRN
  Administered 2023-02-22 (×2): 2 mg via ORAL
  Filled 2023-02-22 (×3): qty 1

## 2023-02-22 MED ORDER — SODIUM CHLORIDE 0.9 % IV SOLN: 250.0000 mL | INTRAVENOUS | Status: DC

## 2023-02-22 MED ORDER — ORAL CARE MOUTH RINSE: 15.0000 mL | OROMUCOSAL | Status: DC | PRN

## 2023-02-22 MED ORDER — HEPARIN SODIUM (PORCINE) 1000 UNIT/ML IJ SOLN
INTRAMUSCULAR | Status: AC
  Filled 2023-02-22: qty 6

## 2023-02-22 MED ORDER — ENSURE ENLIVE PO LIQD
237.0000 mL | Freq: Two times a day (BID) | ORAL | Status: DC
  Administered 2023-02-22 – 2023-03-02 (×8): 237 mL via ORAL

## 2023-02-22 MED ORDER — HALOPERIDOL LACTATE 5 MG/ML IJ SOLN
5.0000 mg | Freq: Four times a day (QID) | INTRAMUSCULAR | Status: AC | PRN
  Administered 2023-02-22 (×2): 5 mg via INTRAVENOUS
  Filled 2023-02-22 (×2): qty 1

## 2023-02-22 MED ORDER — HEPARIN SODIUM (PORCINE) 1000 UNIT/ML IJ SOLN
INTRAMUSCULAR | Status: AC
  Filled 2023-02-22: qty 3

## 2023-02-22 NOTE — Progress Notes (Signed)
eLink Physician-Brief Progress Note Patient Name: [name] [name] DOB: 08-15-1950 MRN: [id number]   Date of Service  02/22/2023  HPI/Events of Note  72 year old admitted with HCAP and peritonitis that has been dealing with ongoing severe agitation requiring sitter at bedside and repeated doses of Ativan/Haldol.  Adequate pain control.  He continues to try to get out of bed and is quite agitated  Having frequent bowel movements with significant excoriation  eICU Interventions  Renew wrist restraints, Haldol  Insert rectal tube.     Intervention Category Minor Interventions: Agitation / anxiety - evaluation and management;Routine modifications to care plan (e.g. PRN medications for pain, fever)    02/22/2023, 9:01 PM

## 2023-02-22 NOTE — Evaluation (Signed)
Occupational Therapy Evaluation Patient Details Name: [name] [name] MRN: [id number] DOB: Nov 09, 1950 Today's Date: 02/22/2023   History of Present Illness 72 y/o male admitted 7/31 for confusion and progressive SOB, pt with PNA and transaminitis.8/3 PD catheter malfunction. 8/5 Peritoneal dialysis catheter removed. PMHx:  CABG, COPD, IDT2DM, ESRD on PD, CAD, Rt TKA, HTN, gout   Clinical Impression   PTA, per chart, pt lived with spouse and was mod I for ADL and IADL. Upon eval, pt presents with oxygen dependence, poor balance, decreased strength, cardiopulmonary endurance, safety, and cognition. Pt requiring up to min A for UB ADL and max A for LB ADL. Pt able to perform oral care seated without back support and sustain task without cues ~2 mins this session. Needing encouragement to perform transfers due to fatigue but able to perform basic transfers with CGA and up to mod cues for safety/hand placement. Will continue to follow. At this time, patient will benefit from continued inpatient follow up therapy, <3 hours/day.        If plan is discharge home, recommend the following: A little help with walking and/or transfers;A little help with bathing/dressing/bathroom;A lot of help with bathing/dressing/bathroom;Assistance with cooking/housework;Direct supervision/assist for medications management;Assist for transportation;Help with stairs or ramp for entrance;Direct supervision/assist for financial management;Assistance with feeding    Functional Status Assessment  Patient has had a recent decline in their functional status and demonstrates the ability to make significant improvements in function in a reasonable and predictable amount of time.  Equipment Recommendations  Other (comment) (defer)    Recommendations for Other Services       Precautions / Restrictions Precautions Precautions: Fall;Other (comment) Precaution Comments: watch sats Restrictions Weight Bearing Restrictions: No       Mobility Bed Mobility               General bed mobility comments: up in recliner on arrival and departure    Transfers Overall transfer level: Needs assistance Equipment used: Rolling walker (2 wheels) Transfers: Sit to/from Stand, Bed to chair/wheelchair/BSC Sit to Stand: Contact guard assist     Step pivot transfers: Contact guard assist     General transfer comment: cues for hand placement; slow, steady rise.      Balance Overall balance assessment: Needs assistance Sitting-balance support: No upper extremity supported, Feet supported Sitting balance-Leahy Scale: Fair     Standing balance support: Bilateral upper extremity supported, During functional activity, Reliant on assistive device for balance Standing balance-Leahy Scale: Poor Standing balance comment: RW in standing                           ADL either performed or assessed with clinical judgement   ADL Overall ADL's : Needs assistance/impaired Eating/Feeding: Set up;Sitting Eating/Feeding Details (indicate cue type and reason): drinking milk from carton with straw. Set-up only. Able to bring to mouth easily, however, when scheduling with RN earlier in day, pt had just spilled open cup, so perhaps more difficulty with open cup/requirement of more hand/wrist mobility. Grooming: Oral care;Cueing for sequencing;Sitting Grooming Details (indicate cue type and reason): step by step cues to set-up and utilize suction with toothbrush and rinse. Pt very motivated and able to sustain this task ~2 mins without cues to continue Upper Body Bathing: Sitting;Contact guard assist   Lower Body Bathing: Maximal assistance;Sit to/from stand   Upper Body Dressing : Minimal assistance;Sitting   Lower Body Dressing: Maximal assistance;Sit to/from stand   Toilet Transfer: Stand-pivot;Rolling  walker (2 wheels);BSC/3in1;Contact guard assist;Cueing for sequencing;Cueing for safety Toilet Transfer Details  (indicate cue type and reason): for balance. Cues for safety Toileting- Clothing Manipulation and Hygiene: Minimal assistance;Sit to/from stand Toileting - Clothing Manipulation Details (indicate cue type and reason): for balance.     Functional mobility during ADLs: Rolling walker (2 wheels);Contact guard assist General ADL Comments: light min A up from chair, cues for hand palcement/technique. Pt reporting fatigue between all activities and benefitting from frequent rest breaks. VSS     Vision Ability to See in Adequate Light: 0 Adequate Patient Visual Report: No change from baseline Additional Comments: Pt denies visual changes     Perception         Praxis         Pertinent Vitals/Pain Pain Assessment Pain Assessment: No/denies pain     Extremity/Trunk Assessment Upper Extremity Assessment Upper Extremity Assessment: Generalized weakness (Per RN, pt with BUE tremor but not observed in session. Pt with generalized weakness and poor endurance)   Lower Extremity Assessment Lower Extremity Assessment: Defer to PT evaluation   Cervical / Trunk Assessment Cervical / Trunk Assessment: Normal   Communication Communication Communication: No apparent difficulties   Cognition Arousal: Alert, Lethargic (waxing/waning) Behavior During Therapy: Flat affect Overall Cognitive Status: Impaired/Different from baseline Area of Impairment: Attention, Memory, Following commands, Safety/judgement, Awareness, Problem solving, Orientation                 Orientation Level: Disoriented to, Time (Able to recall year, not month) Current Attention Level: Focused, Sustained (sustained for short periods) Memory: Decreased short-term memory Following Commands: Follows one step commands inconsistently, Follows one step commands with increased time Safety/Judgement: Decreased awareness of deficits, Decreased awareness of safety Awareness: Intellectual, Emergent (aware of fatigue and asking  for rest breaks.) Problem Solving: Slow processing, Requires verbal cues, Requires tactile cues, Decreased initiation General Comments: Eager to perform ADL     General Comments  SpO2 95% on 5L    Exercises     Shoulder Instructions      Home Living Family/patient expects to be discharged to:: Private residence Living Arrangements: Spouse/significant other Available Help at Discharge: Family;Available 24 hours/day Type of Home: House Home Access: Stairs to enter [employer] of Steps: 1 Entrance Stairs-Rails: Left Home Layout: One level;Laundry or work area in basement     [employer] Shower/Tub: Chief Strategy Officer: Handicapped height     Home Equipment: Agricultural consultant (2 wheels);Crutches;BSC/3in1          Prior Functioning/Environment Prior Level of Function : Independent/Modified Independent;Driving             Mobility Comments: independent mobility ADLs Comments: independent ADLs, light IADLs, driving, manages meds        OT Problem List: Decreased strength;Impaired balance (sitting and/or standing);Decreased activity tolerance;Decreased knowledge of use of DME or AE;Decreased safety awareness;Decreased cognition;Decreased coordination;Impaired UE functional use      OT Treatment/Interventions: Self-care/ADL training;Therapeutic exercise;DME and/or AE instruction;Balance training;Patient/family education;Therapeutic activities;Cognitive remediation/compensation    OT Goals(Current goals can be found in the care plan section) Acute Rehab OT Goals Patient Stated Goal: get better OT Goal Formulation: With patient Time For Goal Achievement: 03/08/23 Potential to Achieve Goals: Good  OT Frequency: Min 2X/week    Co-evaluation              AM-PAC OT "6 Clicks" Daily Activity     Outcome Measure Help from another person eating meals?: A Little Help from another person taking  care of personal grooming?: A Little Help from another  person toileting, which includes using toliet, bedpan, or urinal?: A Little Help from another person bathing (including washing, rinsing, drying)?: A Lot Help from another person to put on and taking off regular upper body clothing?: A Little Help from another person to put on and taking off regular lower body clothing?: A Lot 6 Click Score: 16   End of Session Equipment Utilized During Treatment: Gait belt;Rolling walker (2 wheels) Nurse Communication: Mobility status  Activity Tolerance: Patient tolerated treatment well Patient left: in chair;with call bell/phone within reach;with chair alarm set  OT Visit Diagnosis: Unsteadiness on feet (R26.81);Muscle weakness (generalized) (M62.81);Other symptoms and signs involving cognitive function                Time: 1245-1304 OT Time Calculation (min): 19 min Charges:  OT General Charges $OT Visit: 1 Visit OT Evaluation $OT Eval Moderate Complexity: 1 Mod  [name], OTR/L [facility] Acute Rehabilitation Office: [phone]   [name] 02/22/2023, 3:05 PM

## 2023-02-22 NOTE — Progress Notes (Signed)
   02/22/23 2312  BiPAP/CPAP/SIPAP  BiPAP/CPAP/SIPAP Pt Type Adult  BiPAP/CPAP/SIPAP V60  Mask Type Full face mask  Mask Size Large  Set Rate 15 breaths/min  Respiratory Rate 22 breaths/min  IPAP 22 cmH20  EPAP 8 cmH2O  FiO2 (%) 50 %  Minute Ventilation 15.7  Leak 3  Peak Inspiratory Pressure (PIP) 21  Tidal Volume (Vt) 720  Patient Home Equipment No  Auto Titrate No  CPAP/SIPAP surface wiped down Yes   Pt resting comfortably on bipap w/no distress.  RT will continue to monitor

## 2023-02-22 NOTE — Plan of Care (Signed)
  Problem: Respiratory: Goal: Ability to maintain a clear airway will improve Outcome: Progressing   Problem: Respiratory: Goal: Levels of oxygenation will improve Outcome: Progressing   Problem: Respiratory: Goal: Ability to maintain adequate ventilation will improve Outcome: Progressing   Problem: Skin Integrity: Goal: Risk for impaired skin integrity will decrease Outcome: Progressing   Problem: Nutritional: Goal: Maintenance of adequate nutrition will improve Outcome: Progressing   Problem: Metabolic: Goal: Ability to maintain appropriate glucose levels will improve Outcome: Progressing

## 2023-02-22 NOTE — Progress Notes (Signed)
Physical Therapy Treatment Patient Details Name: [name] [name] MRN: [id number] DOB: 05-06-1951 Today's Date: 02/22/2023   History of Present Illness 72 y/o male admitted 7/31 for confusion and progressive SOB, pt with PNA and transaminitis.8/3 PD catheter malfunction. 8/5 Peritoneal dialysis catheter removed. PMHx:  CABG, COPD, IDT2DM, ESRD on PD, CAD, Rt TKA, HTN, gout    PT Comments  Pt greeted resting in bed and eager for OOB mobility with continued progress towards acute goals. Pt able to come to sitting EOB with min A to elevate trunk, transfer to stand and step pivot to chair with up to min A to steady and for cues for sequencing. Pt able to complete standing marching at chair however pt continues to fatigue quickly with pt desatting into upper 80s% on 5L HFNC, recovering to >90% in <5 seconds once seated. Pt agreeable to time up in chair at end of session with chair alarm active and call bell in reach. Current plan remains appropriate to address deficits and maximize functional independence and decrease caregiver burden. Pt continues to benefit from skilled PT services to progress toward functional mobility goals.      If plan is discharge home, recommend the following: A lot of help with walking and/or transfers;A lot of help with bathing/dressing/bathroom;Assistance with cooking/housework;Assistance with feeding;Direct supervision/assist for financial management;Direct supervision/assist for medications management   Can travel by private vehicle     No  Equipment Recommendations  BSC/3in1;Wheelchair (measurements PT)    Recommendations for Other Services       Precautions / Restrictions Precautions Precautions: Fall;Other (comment) Precaution Comments: watch sats Restrictions Weight Bearing Restrictions: No     Mobility  Bed Mobility Overal bed mobility: Needs Assistance Bed Mobility: Supine to Sit     Supine to sit: HOB elevated, Min assist     General bed mobility  comments: pt with good initiation, cues to utilize bedrail min A to elevate trunk and bring LLE off EOB    Transfers Overall transfer level: Needs assistance Equipment used: Rolling walker (2 wheels) Transfers: Sit to/from Stand, Bed to chair/wheelchair/BSC Sit to Stand: Min assist   Step pivot transfers: Contact guard assist       General transfer comment: min assist to stand from bed with cues for hand placement and sequence, CGA to step pivot to chair    Ambulation/Gait             Pre-gait activities: standing marching x6 each side before max fatigue and pt needing to sit General Gait Details: not able to progress this date   Stairs             Wheelchair Mobility     Tilt Bed    Modified Rankin (Stroke Patients Only)       Balance Overall balance assessment: Needs assistance Sitting-balance support: No upper extremity supported, Feet supported Sitting balance-Leahy Scale: Fair     Standing balance support: Bilateral upper extremity supported, During functional activity, Reliant on assistive device for balance Standing balance-Leahy Scale: Poor Standing balance comment: RW in standing                            Cognition Arousal: Alert, Lethargic (waxing/waning) Behavior During Therapy: Flat affect Overall Cognitive Status: Impaired/Different from baseline Area of Impairment: Attention, Memory, Following commands, Safety/judgement, Awareness, Problem solving                   Current Attention Level: Focused Memory:  Decreased short-term memory Following Commands: Follows one step commands inconsistently, Follows one step commands with increased time Safety/Judgement: Decreased awareness of deficits, Decreased awareness of safety   Problem Solving: Slow processing, Requires verbal cues, Requires tactile cues, Decreased initiation General Comments: A&Ox4, intermittently closing eyes needing cues to remain alert, pt eager for OOB  to chair        Exercises Other Exercises Other Exercises: standing marching x12    General Comments General comments (skin integrity, edema, etc.): SpO2 95% on 5L on arrival, varrying poor pleth throughout session, dropping to mid 80s during standing marching, recovering quickly (<5 seconds) to >90% BP slightly elevated but stable      Pertinent Vitals/Pain Pain Assessment Pain Assessment: No/denies pain    Home Living                          Prior Function            PT Goals (current goals can now be found in the care plan section) Acute Rehab PT Goals Patient Stated Goal: to get to chair and have some milk PT Goal Formulation: With patient Time For Goal Achievement: 02/23/23 Progress towards PT goals: Progressing toward goals    Frequency    Min 1X/week      PT Plan      Co-evaluation              AM-PAC PT "6 Clicks" Mobility   Outcome Measure  Help needed turning from your back to your side while in a flat bed without using bedrails?: A Lot Help needed moving from lying on your back to sitting on the side of a flat bed without using bedrails?: A Lot Help needed moving to and from a bed to a chair (including a wheelchair)?: A Little Help needed standing up from a chair using your arms (e.g., wheelchair or bedside chair)?: A Little Help needed to walk in hospital room?: A Lot Help needed climbing 3-5 steps with a railing? : Total 6 Click Score: 13    End of Session Equipment Utilized During Treatment: Gait belt;Oxygen Activity Tolerance: Patient tolerated treatment well Patient left: in chair;with call bell/phone within reach;with chair alarm set Nurse Communication: Mobility status;Other (comment) (gave pt 1 carton milk per pt request (pt on 1200 ml fluid restriction)) PT Visit Diagnosis: Muscle weakness (generalized) (M62.81);Unsteadiness on feet (R26.81)     Time: 8469-6295 PT Time Calculation (min) (ACUTE ONLY): 24  min  Charges:    $Gait Training: 8-22 mins $Therapeutic Activity: 8-22 mins PT General Charges $$ ACUTE PT VISIT: 1 Visit                      R. PTA Acute Rehabilitation Services Office: [phone]   Catalina Antigua 02/22/2023, 12:43 PM

## 2023-02-22 NOTE — Progress Notes (Signed)
Pt access has present thrill and bruit. Pt access to cannulate 16 g needles with flash but infiltrated d/t AVF deep. short and weak. Ice bag given Dr. Arlean Hopping notified . Order received as follow: wil schedule out  tomorrow.

## 2023-02-22 NOTE — Progress Notes (Signed)
Subjective: pt is more responsive and interactive today  Objective Vital signs in last 24 hours: Vitals:   02/22/23 0800 02/22/23 0900 02/22/23 1000 02/22/23 1130  BP: 124/85 129/77 (!) 148/86   Pulse: 75 85 81   Resp: (!) 23 20 16    Temp:    98 F (36.7 C)  TempSrc:    Oral  SpO2: 96% 99% 97%   Weight:      Height:       Weight change: 0.8 kg  Physical Exam: General: more alert today Heart: RRR no MRG Lungs: no coughing, no rhonchi, no rales Abdomen: soft, obese, PD cath has been removed Extremities: No pedal edema LUA AVF +positive thrill  temp HD cath has been removed Neuro: as above   Home med --> albuterol, xanax, aspirin, aotrvastatin, clopidogrel, esomeprazole, gabapentin, insulin glargine, linagliptin, midodrine   Outpatient PD orders:  Followed by [facility] (note his PD RN was on vacation  but nephrology called [place] location for orders  [facility] - davita Chadron Ed[facility] - 100kg 5 cycles, 3000 fill vol (note pt states on consult he thinks is 2.5 liters), total time: 10 hrs, last fill 1500 cc Mircera 150 mcg q monthly Venofer 100mg  once a month L AVF +thrill but small + tortuous    CXR 8/05 - IMPRESSION: Persistent bibasilar opacities, which may represent atelectasis or pneumonia.  Assessment/ Plan: Peritonitis PD catheter related peritonitis - initial cell count 2200, 93% PMNs. Pt initially rec'd IV cefepime, vanc and flagyl. PD fluid cx was negative and initial BCx's done 8/01 were negative.  Repeat blood cx's however done 8/04 were + for GPC and fungus (BCID showing Staph and C. Glabrata). ID consulting and abx changed to IV rocephin and micafungin. PD cath removed 8/05 due to fungemia. Per ccm / ID.  ESRD - PD not working 8/2 --> IR placed temp HD catheter and underwent HD 8/3.  PD cath removed 8/05 due to fungal sepsis which was new as inpatient. Had iHD on 8/06 via temp cath then it was dc'd. Next HD today, will attempt using the L arm  AVF.  HD access - temp HD cath placed was dc'd yesterday 8/07 for a line holiday. His L BC AVF was created in 2018 and ready to use per notes back then. Plan to try to use the AVF today with his dialysis. If doesn't work will need replacement of temp cath (vs TDC).  Metabolic encephalopathy - due to sepsis from peritonitis. More alert again today.   Acute hypoxic resp failure - resolved BP/volume - BP's a bit soft in 100s, 4kg under dry wt, no vol^ on exam. May be a bit dry , not eating. Keep even w/ HD today.  Transaminitis - low BP's possibly Anemia esrd - Hb 10.6 <12.2 no ESA needed currently monitor Secondary hyperparathyroidism -corrected calcium 10, no vitamin D, phosphorus 8.2, Renvela binder   Rob   MD  [facility] 02/22/2023, 12:14 PM  Recent Labs  Lab 02/19/23 0458 02/20/23 0126 02/20/23 0538 02/21/23 0407 02/22/23 0023  HGB 9.4*   < > 8.7* 8.6* 8.9*  ALBUMIN <1.5*  --   --  <1.5*  --   CALCIUM 7.8*  --  6.5* 7.4* 7.9*  PHOS  --   --  5.7*  --  5.1*  CREATININE 11.66*  --  11.53* 8.88* 10.57*  K 3.4*   < > 3.1* 4.0 4.0   < > = values in this interval not displayed.  Inpatient medications:  aspirin EC  81 mg Oral Daily   Chlorhexidine Gluconate Cloth  6 each Topical Q0600   Chlorhexidine Gluconate Cloth  6 each Topical Q0600   clopidogrel  75 mg Oral Daily   feeding supplement  237 mL Oral BID BM   heparin  5,000 Units Subcutaneous Q8H   insulin aspart  0-5 Units Subcutaneous QHS   insulin aspart  0-6 Units Subcutaneous TID WC   ipratropium-albuterol  3 mL Nebulization BID   midodrine  5 mg Oral TID WC   mometasone-formoterol  2 puff Inhalation BID   pantoprazole (PROTONIX) IV  40 mg Intravenous Q12H   sevelamer carbonate  2,400 mg Oral TID WC   sodium chloride flush  3 mL Intravenous Q12H    sodium chloride     ampicillin-sulbactam (UNASYN) IV Stopped (02/21/23 2213)   micafungin (MYCAMINE) 100 mg in sodium chloride 0.9 % 100 mL IVPB Stopped (02/22/23 0216)    norepinephrine (LEVOPHED) Adult infusion     acetaminophen, albuterol, fentaNYL (SUBLIMAZE) injection, haloperidol lactate, heparin sodium (porcine) 3,000 Units in dialysis solution 2.5% low-MG/low-CA 6,000 mL dialysis solution, HYDROmorphone (DILAUDID) injection, ondansetron (ZOFRAN) IV, mouth rinse         Labs: Basic Metabolic Panel: Recent Labs  Lab 02/16/23 0117 02/17/23 0351 02/20/23 0538 02/21/23 0407 02/22/23 0023  NA 130*   < > 137 135 134*  K 4.3   < > 3.1* 4.0 4.0  CL 91*   < > 99 94* 93*  CO2 19*   < > 23 26 24   GLUCOSE 284*   < > 89 81 86  BUN 57*   < > 57* 34* 46*  CREATININE 14.71*   < > 11.53* 8.88* 10.57*  CALCIUM 7.2*   < > 6.5* 7.4* 7.9*  PHOS 8.2*  --  5.7*  --  5.1*   < > = values in this interval not displayed.   Liver Function Tests: Recent Labs  Lab 02/18/23 0425 02/19/23 0458 02/21/23 0407  AST 520* 248* 75*  ALT 594* 359* 178*  ALKPHOS 142* 99 87  BILITOT 1.0 1.2 1.2  PROT 5.1* 5.0* 5.1*  ALBUMIN <1.5* <1.5* <1.5*   No results for input(s): "LIPASE", "AMYLASE" in the last 168 hours.  No results for input(s): "AMMONIA" in the last 168 hours.  CBC: Recent Labs  Lab 02/17/23 0351 02/17/23 2332 02/18/23 0425 02/19/23 0458 02/20/23 0126 02/20/23 0538 02/21/23 0407 02/22/23 0023  WBC 6.7   < > 5.1 5.0  --  6.6 8.7 11.9*  NEUTROABS 5.7  --  4.6 4.7  --   --   --   --   HGB 10.4*   < > 9.8* 9.4*   < > 8.7* 8.6* 8.9*  HCT 32.3*   < > 29.8* 28.9*   < > 26.4* 26.3* 27.9*  MCV 83.9   < > 83.9 83.8  --  83.5 84.0 85.1  PLT 139*   < > 126*  116* 104*  --  107* 127* 133*   < > = values in this interval not displayed.   Cardiac Enzymes: No results for input(s): "CKTOTAL", "CKMB", "CKMBINDEX", "TROPONINI" in the last 168 hours.  CBG: Recent Labs  Lab 02/21/23 1534 02/21/23 2148 02/22/23 0424 02/22/23 0749 02/22/23 1122  GLUCAP 85 87 83 88 100*    Studies/Results: DG CHEST PORT 1 VIEW  Result Date: 02/21/2023 CLINICAL DATA:   72 year old male with history of tachypnea. EXAM: PORTABLE CHEST 1 VIEW COMPARISON:  Chest x-ray 02/19/2023. FINDINGS: Right internal jugular central venous catheter with tip terminating in the right atrium. Lung volumes are very low. Patchy areas of interstitial prominence, diffuse peribronchial cuffing, and ill-defined airspace disease noted throughout the lungs bilaterally, most evident in the mid to lower lungs. Small bilateral pleural effusions. No pneumothorax. Pulmonary vasculature is largely obscured. Heart size is mildly enlarged. Upper mediastinal contours are unremarkable. Status post median sternotomy for CABG. IMPRESSION: 1. The appearance of the chest is suggestive of multilobar bronchopneumonia, although some degree of underlying pulmonary edema is not excluded. 2. Small bilateral pleural effusions. 3. Mild cardiomegaly. Electronically Signed   By: Trudie Reed M.D.   On: 02/21/2023 06:41   Medications:  sodium chloride     ampicillin-sulbactam (UNASYN) IV Stopped (02/21/23 2213)   micafungin (MYCAMINE) 100 mg in sodium chloride 0.9 % 100 mL IVPB Stopped (02/22/23 0216)   norepinephrine (LEVOPHED) Adult infusion      aspirin EC  81 mg Oral Daily   Chlorhexidine Gluconate Cloth  6 each Topical Q0600   Chlorhexidine Gluconate Cloth  6 each Topical Q0600   clopidogrel  75 mg Oral Daily   feeding supplement  237 mL Oral BID BM   heparin  5,000 Units Subcutaneous Q8H   insulin aspart  0-5 Units Subcutaneous QHS   insulin aspart  0-6 Units Subcutaneous TID WC   ipratropium-albuterol  3 mL Nebulization BID   midodrine  5 mg Oral TID WC   mometasone-formoterol  2 puff Inhalation BID   pantoprazole (PROTONIX) IV  40 mg Intravenous Q12H   sevelamer carbonate  2,400 mg Oral TID WC   sodium chloride flush  3 mL Intravenous Q12H

## 2023-02-22 NOTE — Progress Notes (Addendum)
Initial Nutrition Assessment  DOCUMENTATION CODES:   Not applicable  INTERVENTION:   - Ensure Enlive po BID, each supplement provides 350 kcal and 20 grams of protein  - Magic Cup TID with meals, each supplement provides 290 kcal and 9 grams of protein  - Liberalize diet to Regular with 1800 ml fluid restriction  - Renal MVI daily  NUTRITION DIAGNOSIS:   Increased nutrient needs related to acute illness, chronic illness as evidenced by estimated needs.  GOAL:   Patient will meet greater than or equal to 90% of their needs  MONITOR:   PO intake, Supplement acceptance, Labs, Weight trends, I & O's  REASON FOR ASSESSMENT:   Consult Assessment of nutrition requirement/status  ASSESSMENT:   72 year old male who presented to the ED on 7/31 with cough, N/V, positive COVID test 10 days prior to presentation. PMH of CABG in November 2023, COPD, T2DM, GERD, ESRD on CCPD. Pt admitted with acute metabolic encephalopathy, possible HCAP, peritonitis.  08/02 - renal/carb modified diet with 1200 ml fluid restriction 08/03 - malfunction of peritoneal dialysis catheter, s/p temporary HD catheter placement in IR 08/04 - NPO 08/05 - NPO, s/p peritoneal dialysis catheter removal, diet advanced to renal with 1200 ml fluid restriction 08/06 - temporary HD catheter removal 08/07 - back to ICU with recurrent encephalopathy  Discussed pt with RN and during ICU rounds. Pt with bacterial peritonitis and polymicrobial/candida glabrata bacteremia. Pt is currently on a line holiday. Per notes, pt will need TEE.  Per RN, pt ate something for breakfast for the first time in several days. Pt consumed applesauce, 1 bite of pancake, milk, and some cookies.  Spoke with pt at bedside. Pt is a poor historian. Pt reports appetite is okay. He states that he typically eats well at home and consumes 2-3 meals daily. He is unable to provide more information on diet PTA.  Pt denies any recent weight changes. He  is unsure of his UBW. He reports that it is "200 something."  Reviewed weight history in chart. Pt's weight has fluctuated between 95.7-103.4 kg over the last 6 months. Pt has had a 7.2 kg weight loss since 12/28/22. This is a 7% weight loss in 2 months which is significant for timeframe. However, it is difficult to determine how much of weight loss is true dry weight vs volume shifts due to ESRD on PD. Current weight of 96.2 kg is lowest weight since 09/16/22 when pt weighed 95.7 kg. Suspect true dry weight loss in present.  Pt states that he does like strawberry supplements. Will order strawberry Ensure BID between meals. Will also liberalize diet to Regular to provide maximum options at meals. Discussed with Nephrology and PCCM MD. Molli Knock to liberalize fluid restriction as well.  Admit weight: 99.8 kg Current weight: 96.2 kg  Meal Completion: 50% x 1 documented meal on 8/02  Medications reviewed and include: SSI, IV protonix, renvela 2400 mg TID with meals, IV abx, IV micafungin, levophed gtt ordered but not started  Labs reviewed: sodium 134, chloride 93, phosphorus 5.1, WBC 11.9, hemoglobin 8.9, platelets 133 CBG's: 82-100 x 24 hours  I/O's: +2.9 L since admit  NUTRITION - FOCUSED PHYSICAL EXAM:  Flowsheet Row Most Recent Value  Orbital Region Mild depletion  Upper Arm Region No depletion  Thoracic and Lumbar Region No depletion  Buccal Region No depletion  Temple Region No depletion  Clavicle Bone Region Mild depletion  Clavicle and Acromion Bone Region Mild depletion  Scapular Bone Region No depletion  Dorsal Hand No depletion  Patellar Region No depletion  Anterior Thigh Region Mild depletion  Posterior Calf Region Mild depletion  Edema (RD Assessment) Mild  Hair Reviewed  Eyes Reviewed  Mouth Reviewed  Skin Reviewed  Nails Reviewed    Diet Order:   Diet Order             Diet regular Room service appropriate? Yes; Fluid consistency: Thin; Fluid restriction: 1800 mL  Fluid  Diet effective now                   EDUCATION NEEDS:   Not appropriate for education at this time  Skin:  Skin Assessment: Skin Integrity Issues: Incisions: abd Other: IAD anus  Last BM:  02/21/23 multiple type 6  Height:   Ht Readings from Last 1 Encounters:  02/14/23 5\' 6"  (1.676 m)    Weight:   Wt Readings from Last 1 Encounters:  02/22/23 96.2 kg    Ideal Body Weight:  64.5 kg  BMI:  Body mass index is 34.23 kg/m.  Estimated Nutritional Needs:   Kcal:  2100-2300  Protein:  110-125 grams  Fluid:  1000 ml + UOP    Mertie Clause, MS, RD, LDN Registered Dietitian II Please see AMiON for contact information.

## 2023-02-22 NOTE — Progress Notes (Signed)
Progress update  Patient was unable to receive HD today due to inability to cannulate his AVF. Discussed with nephrology, he will need another temp HD line for HD tomorrow. Will defer placement tonight to give him a complete line holiday. Other issues - having non-watery diarrhea related to abx administration, so will order imodium. Having anxiety and agitation which is briefly responsive to haldol.   Consider precedex if haldol prn not enough.  Additional cc time 35 minutes.   Durel Salts, MD Pulmonary and Critical Care Medicine [facility] 02/22/2023 6:11 PM Pager: see AMION  If no response to pager, please call critical care on call (see AMION) until 7pm After 7:00 pm call Elink

## 2023-02-22 NOTE — Progress Notes (Addendum)
NAME:  [name] [name], MRN:  [id number], DOB:  29-Jul-1950, LOS: 7 ADMISSION DATE:  02/14/2023, CONSULTATION DATE:  02/18/23 REFERRING MD:  Reggie Pile of [facility] MD X-cver, CHIEF COMPLAINT:  SIRS, sepsis, encephalopathy resp distress    History of Present Illness:   72 year old BMI 35.5 obese male with a history of bypass in 2023, COPD not otherwise specified, type 2 diabetes mellitus, acid reflux, ESRD on peritoneal dialysis, hypotension on midodrine.  Sometime mid June 2024 had COVID-19 and treated with Augmentin.  Subsequent to that had cough and poor oral intake and lethargy.  Despite t his mid juoy 2-24 - 1000 feeet in 5 min on room air without desaturation.   Presented to [facility] on 02/14/2023 for confusion and worsening shortness of breath and hypersomnolence.  At the time of admission is requiring 2 L nasal cannula and he had transaminitis.  Chest x-ray suggested pneumonia and a procalcitonin over 150 with a fever of 102.  Started on broad antibiotics and transferred to [facility] campus.  Working diagnosis was peritonitis from his peritoneal dialysis catheter based on cell count 2200 and hazy fluid in the peritoneal dialysate  Subsequent course - 02/16/2023 felt to be responding to antibiotics more awake.  Nursing noticed him to be ambulatory to the bathroom.  Home Xanax and Neurontin were being held.  For his symptomatic transaminitis he had right upper quadrant ultrasound that suggested cirrhosis.  He did have peritoneal dialysis on 02/16/2023  -02/17/2023: Malfunction of peritoneal dialysis catheter with complaints of abdominal pain.  Has subsequent status post temporary HD catheter right internal jugular vein.  He then underwent emergent dialysis and returned back to the unit at around 10 PM 02/17/2023.  Nursing noticed marked difference.  He was less interactive.  He was alert and having a gaze but not following commands.  He would mumble a word.  His temperature was  101.5.  Tachypneic having tremors oxygen needs now at 5 L [.  Hemodialysis was at 4 L] and saturating 100% status post -1.2 L on hemodialysis.  Chest x-ray done showed worsening pulm infiltrates particularly lower lobes compared to admission.  Critical care medicine consulted.  By hospitalist.   Significant Hospital Events:  -02/14/2023 - admit -02/16/2023 felt to be responding to antibiotics more awake.  Nursing noticed him to be ambulatory to the bathroom.  Home Xanax and Neurontin were being held.  For his symptomatic transaminitis he had right upper quadrant ultrasound that suggested cirrhosis.  He did have peritoneal dialysis on 02/16/2023 -02/17/2023: Malfunction of peritoneal dialysis catheter with complaints of abdominal pain.  Has subsequent status post temporary HD catheter right internal jugular vein.  He then underwent emergent dialysis and returned back to the unit at around 10 PM 02/17/2023.  Nursing noticed marked difference.  He was less interactive.  He was alert and having a gaze but not following commands.  He would mumble a word.  His temperature was 101.5.  Tachypneic having tremors oxygen needs now at 5 L [.  Hemodialysis was at 4 L] and saturating 100% status post -1.2 L on hemodialysis.  Chest x-ray done showed worsening pulm infiltrates particularly lower lobes compared to admission.  Critical care medicine consulted.   -02/18/23: early hours - CCM consult -8/5 blood cultures >> Candida glabrata, staph species -8/5 PD catheter removed by vascular surgery -8/6 micafungin.  Ceftriaxone changed to Unasyn based on culture data -8/7 back to ICU w encephalopathy   Interim History / Subjective:  Still encephalopathic overnight. Not requiring any continuous sedation but requiring restraints for pulling off oxygen No po intake.  Having loose stools.  Says he is hungry and would like to try eating.  Objective   Blood pressure 104/66, pulse 74, temperature 97.6 F (36.4 C), temperature  source Oral, resp. rate (!) 22, height 5\' 6"  (1.676 m), weight 96.2 kg, SpO2 98%.        Intake/Output Summary (Last 24 hours) at 02/22/2023 0827 Last data filed at 02/22/2023 0400 Gross per 24 hour  Intake 325.02 ml  Output --  Net 325.02 ml   Filed Weights   02/21/23 0823 02/21/23 2208 02/22/23 0330  Weight: 97.4 kg 96.2 kg 96.2 kg    Examination: Gen:      Laying flat in bed comfortably, no distress HEENT:  on nasal cannula Lungs:    diminished bilateral base, no wheeze CV:         RRR Abd:      + bowel sounds; soft, non-tender; no palpable masses, no distension Ext:    No edema, LUE AVR +thrill and bruit Skin:      Warm and dry; no rashes Neuro:   awake, oriented to self, follows commands, intermittently agitated and impulsive requiring restraints.   Labs and imaging reviewed Na 134 Cr 10.57 BUN 46 K 4.9 Phos 5.1 Hgb 8.9  Resolved Hospital Problem list   Tremor  Assessment & Plan:  Acute metabolic/septic encephalopathy - treat sepsis as below -Minimize sedating/mind altering medications as able.  He may require some intermittent Ativan or possibly even Precedex to allow care -His home Xanax and gabapentin remain on hold (probable outside of window for withdrawal so wil continue to hold - has been having encephalopathy following HD, ? If he is having some watershed distribution hypoperfusion during HD.  - continue to monitor in ICU today with HD trial.   Sepsis due to peritonitis, Candida glabrata and multi organism bacteremia (Enterococcus, staph hemolyticus) -Continue micafungin, ceftriaxone was changed to Unasyn on 8/6.  - suspected source PD catheter removed. Temp HD catheter is now also out. Currently on line holiday - appreciate ID consultation -TTE reviewed, negative for vegetation, will need TEE - ophtho eval negative for endophthalmitis  Acute hypoxemic respiratory failure, currently on 5LNC< will titrate down for goal sats >90% Acute pulmonary edema due to  volume overload from ESRD Suspected OHS/OSA - Question whether he may require BiPAP nightly long-term -chest xray reviewed - cardiomegaly and acute pulmonary edema -Careful volume removal with HD per nephrology plans  End-stage renal disease on peritoneal dialysis in [place].  PD catheter out. Insertion of new hemodialysis catheter 8/3, removed 8/6 -Appreciate nephrology management.  Will need to discuss possibility that one of his stressors and reasons for decompensation is his ability to tolerate HD -Agree with line holiday -Follow BMP - trial of HD today while in ICU - continue midodrine 5 mg TID. May require low dose pressors to achieve this. Goal SBP>100  Transaminitis, suspect shock liver -Follow LFT and coags  Protein calorie malnutrition - need to consider cortrak - consult to RD and SLP  Best practice (daily eval):  Diet: Renal diet  Pain/Anxiety/Delirium protocol (if indicated): x VAP protocol (if indicated): x DVT prophylaxis: heparin sq GI prophylaxis:  PPI Glucose control: SSI  Mobility: bed rest Code Status: full code Family Communication:  Disposition: continue to monitor in ICU today  SIGNATURE   The patient is critically ill due to encephalopathy.  Critical care was necessary  to treat or prevent imminent or life-threatening deterioration.  Critical care was time spent personally by me on the following activities: development of treatment plan with patient and/or surrogate as well as nursing, discussions with consultants, evaluation of patient's response to treatment, examination of patient, obtaining history from patient or surrogate, ordering and performing treatments and interventions, ordering and review of laboratory studies, ordering and review of radiographic studies, pulse oximetry, re-evaluation of patient's condition and participation in multidisciplinary rounds.   Critical Care Time devoted to patient care services described in this note is 41  minutes. This time reflects time of care of this signee Charlott Holler . This critical care time does not reflect separately billable procedures or procedure time, teaching time or supervisory time of PA/NP/Med student/Med Resident etc but could involve care discussion time.       Charlott Holler [facility] 02/22/2023 8:28 AM  Pager: see AMION  If no response to pager , please call critical care on call (see AMION) until 7pm After 7:00 pm call Elink

## 2023-02-22 NOTE — Progress Notes (Addendum)
[facility]    Date of Admission:  02/14/2023   Total days of antibiotics 8          ID: [name] [name] is a 72 y.o. male with  PD related peritonitis and secondary bacteremia/fungemia Principal Problem:   Acute encephalopathy Active Problems:   Hyperlipidemia   Essential hypertension   GERD   OSA on CPAP   CAD (coronary artery disease)   Morbid obesity due to excess calories (HCC)   ESRD (end stage renal disease) (HCC)   NSTEMI (non-ST elevated myocardial infarction) (HCC)   S/P CABG x 2   Peritoneal dialysis catheter in place [facility])   Memory loss    Subjective: Afebrile this morning much more alert and eating. More at his baseline. Received haldol last night; less abdominal pain.  Medications:   aspirin EC  81 mg Oral Daily   Chlorhexidine Gluconate Cloth  6 each Topical Q0600   Chlorhexidine Gluconate Cloth  6 each Topical Q0600   clopidogrel  75 mg Oral Daily   feeding supplement  237 mL Oral BID BM   heparin  5,000 Units Subcutaneous Q8H   heparin sodium (porcine)       insulin aspart  0-5 Units Subcutaneous QHS   insulin aspart  0-6 Units Subcutaneous TID WC   ipratropium-albuterol  3 mL Nebulization BID   midodrine  5 mg Oral TID WC   mometasone-formoterol  2 puff Inhalation BID   pantoprazole (PROTONIX) IV  40 mg Intravenous Q12H   sevelamer carbonate  2,400 mg Oral TID WC   sodium chloride flush  3 mL Intravenous Q12H    Objective: Vital signs in last 24 hours: Temp:  [97.6 F (36.4 C)-99.2 F (37.3 C)] 97.6 F (36.4 C) (08/08 1530) Pulse Rate:  [73-111] 100 (08/08 1300) Resp:  [0-43] 21 (08/08 1300) BP: (80-186)/(52-153) 165/122 (08/08 1300) SpO2:  [80 %-99 %] 88 % (08/08 1300) Weight:  [96.2 kg] 96.2 kg (08/08 0330)  Physical Exam  Constitutional: He is oriented to person, place, and time. He appears well-developed and well-nourished. No distress.  HENT:  Mouth/Throat: Oropharynx is clear and moist. No oropharyngeal  exudate.  Cardiovascular: Normal rate, regular rhythm and normal heart sounds. Exam reveals no gallop and no friction rub.  No murmur heard.  Pulmonary/Chest: Effort normal and breath sounds normal. No respiratory distress. He has no wheezes.  Abdominal: Soft. Bowel sounds are normal. He exhibits no distension. There is no tenderness.  Lymphadenopathy:  He has no cervical adenopathy.  Neurological: He is alert and oriented to person, place, and time.  Skin: Skin is warm and dry. No rash noted. No erythema.  Psychiatric: He has a normal mood and affect. His behavior is normal.    Lab Results Recent Labs    02/21/23 0407 02/22/23 0023  WBC 8.7 11.9*  HGB 8.6* 8.9*  HCT 26.3* 27.9*  NA 135 134*  K 4.0 4.0  CL 94* 93*  CO2 26 24  BUN 34* 46*  CREATININE 8.88* 10.57*   Liver Panel Recent Labs    02/21/23 0407  PROT 5.1*  ALBUMIN <1.5*  AST 75*  ALT 178*  ALKPHOS 87  BILITOT 1.2    Microbiology: reviewed Studies/Results: DG CHEST PORT 1 VIEW  Result Date: 02/21/2023 CLINICAL DATA:  72 year old male with history of tachypnea. EXAM: PORTABLE CHEST 1 VIEW COMPARISON:  Chest x-ray 02/19/2023. FINDINGS: Right internal jugular central venous catheter with tip terminating in the right atrium.  Lung volumes are very low. Patchy areas of interstitial prominence, diffuse peribronchial cuffing, and ill-defined airspace disease noted throughout the lungs bilaterally, most evident in the mid to lower lungs. Small bilateral pleural effusions. No pneumothorax. Pulmonary vasculature is largely obscured. Heart size is mildly enlarged. Upper mediastinal contours are unremarkable. Status post median sternotomy for CABG. IMPRESSION: 1. The appearance of the chest is suggestive of multilobar bronchopneumonia, although some degree of underlying pulmonary edema is not excluded. 2. Small bilateral pleural effusions. 3. Mild cardiomegaly. Electronically Signed   By: Trudie Reed M.D.   On: 02/21/2023  06:41     Assessment/Plan: C.glabrata and enterococcal bacteremia = continue on amp/sub plus micafungin. For the time being, plan to continue for 14 days using 8/7 as day 1 (since removal of temp HD catheter) -through 03/06/2022. No need for TEE due to transient bacteremia from peritonitis  PD peritonitis = PD catheter has been removed  ESRD = getting HD today or tomorrow via AVF vs. Needing new temp catheter  Fevers/altered mental status = no new source of infection identified, appears improving. AMS appears improved with haldol  [facility] Pager: [phone]  02/22/2023, 4:00 PM

## 2023-02-23 ENCOUNTER — Inpatient Hospital Stay (HOSPITAL_COMMUNITY): Payer: Medicare Other

## 2023-02-23 ENCOUNTER — Encounter (HOSPITAL_COMMUNITY): Payer: Medicare Other

## 2023-02-23 DIAGNOSIS — G9340 Encephalopathy, unspecified: Secondary | ICD-10-CM | POA: Diagnosis not present

## 2023-02-23 LAB — GLUCOSE, CAPILLARY
Glucose-Capillary: 140 mg/dL — ABNORMAL HIGH (ref 70–99)
Glucose-Capillary: 145 mg/dL — ABNORMAL HIGH (ref 70–99)
Glucose-Capillary: 157 mg/dL — ABNORMAL HIGH (ref 70–99)
Glucose-Capillary: 137 mg/dL — ABNORMAL HIGH (ref 70–99)

## 2023-02-23 LAB — CBC
HCT: 26.8 % — ABNORMAL LOW (ref 39.0–52.0)
Hemoglobin: 8.6 g/dL — ABNORMAL LOW (ref 13.0–17.0)
MCH: 28 pg (ref 26.0–34.0)
MCHC: 32.1 g/dL (ref 30.0–36.0)
MCV: 87.3 fL (ref 80.0–100.0)
Platelets: 106 10*3/uL — ABNORMAL LOW (ref 150–400)
RBC: 3.07 MIL/uL — ABNORMAL LOW (ref 4.22–5.81)
RDW: 17.8 % — ABNORMAL HIGH (ref 11.5–15.5)
WBC: 9.4 10*3/uL (ref 4.0–10.5)
nRBC: 2.6 % — ABNORMAL HIGH (ref 0.0–0.2)

## 2023-02-23 LAB — RENAL FUNCTION PANEL
Albumin: <1.5 g/dL — ABNORMAL LOW (ref 3.5–5.0)
Anion gap: 18 — ABNORMAL HIGH (ref 5–15)
BUN: 61 mg/dL — ABNORMAL HIGH (ref 8–23)
CO2: 24 mmol/L (ref 22–32)
Calcium: 7.9 mg/dL — ABNORMAL LOW (ref 8.9–10.3)
Chloride: 95 mmol/L — ABNORMAL LOW (ref 98–111)
Creatinine, Ser: 12.36 mg/dL — ABNORMAL HIGH (ref 0.61–1.24)
GFR, Estimated: 4 mL/min — ABNORMAL LOW (ref >60)
Glucose, Bld: 184 mg/dL — ABNORMAL HIGH (ref 70–99)
Phosphorus: 6.2 mg/dL — ABNORMAL HIGH (ref 2.5–4.6)
Potassium: 3.9 mmol/L (ref 3.5–5.1)
Sodium: 137 mmol/L (ref 135–145)

## 2023-02-23 LAB — CULTURE, BLOOD (ROUTINE X 2)
Culture: NO GROWTH
Special Requests: ADEQUATE

## 2023-02-23 LAB — MAGNESIUM: Magnesium: 2.5 mg/dL — ABNORMAL HIGH (ref 1.7–2.4)

## 2023-02-23 MED ORDER — HEPARIN SODIUM (PORCINE) 1000 UNIT/ML DIALYSIS: 4000.0000 [IU] | Freq: Once | INTRAMUSCULAR | Status: AC

## 2023-02-23 MED ORDER — CHLORHEXIDINE GLUCONATE CLOTH 2 % EX PADS
6.0000 | MEDICATED_PAD | CUTANEOUS | Status: DC
  Administered 2023-02-23 – 2023-03-02 (×8): 6 via TOPICAL

## 2023-02-23 MED ORDER — CHLORHEXIDINE GLUCONATE CLOTH 2 % EX PADS
6.0000 | MEDICATED_PAD | Freq: Every day | CUTANEOUS | Status: DC
  Administered 2023-02-23: 6 via TOPICAL

## 2023-02-23 MED ORDER — HEPARIN SODIUM (PORCINE) 1000 UNIT/ML DIALYSIS
2000.0000 [IU] | INTRAMUSCULAR | Status: AC | PRN
  Administered 2023-02-27: 2000 [IU] via INTRAVENOUS_CENTRAL

## 2023-02-23 MED ORDER — MIDODRINE HCL 5 MG PO TABS
5.0000 mg | ORAL_TABLET | Freq: Once | ORAL | Status: AC
  Administered 2023-02-23: 5 mg via ORAL
  Filled 2023-02-23: qty 1

## 2023-02-23 MED ORDER — HEPARIN SODIUM (PORCINE) 1000 UNIT/ML IJ SOLN: 2000.0000 [IU] | INTRAMUSCULAR | Status: DC | PRN

## 2023-02-23 MED ORDER — DARBEPOETIN ALFA 60 MCG/0.3ML IJ SOSY
60.0000 ug | PREFILLED_SYRINGE | INTRAMUSCULAR | Status: DC
  Administered 2023-02-23: 60 ug via SUBCUTANEOUS
  Filled 2023-02-23: qty 0.3

## 2023-02-23 MED ORDER — SODIUM CHLORIDE 0.9 % IV SOLN
200.0000 mg | INTRAVENOUS | Status: DC
  Administered 2023-02-24 – 2023-03-02 (×7): 200 mg via INTRAVENOUS
  Filled 2023-02-23 (×7): qty 10

## 2023-02-23 MED ORDER — HEPARIN SODIUM (PORCINE) 1000 UNIT/ML DIALYSIS
3000.0000 [IU] | INTRAMUSCULAR | Status: AC | PRN
  Administered 2023-03-01 (×2): 3000 [IU] via INTRAVENOUS_CENTRAL
  Filled 2023-02-23 (×4): qty 3

## 2023-02-23 MED ORDER — HEPARIN SODIUM (PORCINE) 1000 UNIT/ML IJ SOLN
1000.0000 [IU] | INTRAMUSCULAR | Status: DC | PRN
  Administered 2023-02-27 (×2): 1000 [IU]
  Filled 2023-02-23 (×2): qty 1

## 2023-02-23 MED ORDER — HEPARIN SODIUM (PORCINE) 1000 UNIT/ML IJ SOLN
4000.0000 [IU] | Freq: Once | INTRAMUSCULAR | Status: AC
  Administered 2023-02-23: 4000 [IU] via INTRAVENOUS

## 2023-02-23 NOTE — Progress Notes (Signed)
Subjective: unable to use the AVF for HD yesterday, per RN it was small, deep and very short  Objective Vital signs in last 24 hours: Vitals:   02/23/23 0630 02/23/23 0645 02/23/23 0700 02/23/23 0728  BP: 93/65 98/66 107/77   Pulse: 75 80 75   Resp: 20 (!) 21 20   Temp:    98.3 F (36.8 C)  TempSrc:    Axillary  SpO2: 98% 97% 97%   Weight:      Height:       Weight change: 0.1 kg  Physical Exam: General: more alert today Heart: RRR no MRG Lungs: no coughing, no rhonchi, no rales Abdomen: soft, obese, PD cath has been removed Extremities: No pedal edema LUA AVF +positive thrill  temp HD cath removed Neuro: as above   Home med --> albuterol, xanax, aspirin, aotrvastatin, clopidogrel, esomeprazole, gabapentin, insulin glargine, linagliptin, midodrine   Outpatient PD orders:  Followed by [facility] (note his PD RN was on vacation  but nephrology called [place] location for orders  [facility] - [facility] [place] [facility] - 100kg 5 cycles, 3000 fill vol (note pt states on consult he thinks is 2.5 liters), total time: 10 hrs, last fill 1500 cc Mircera 150 mcg q monthly Venofer 100mg  once a month L AVF +thrill but small + tortuous    CXR 8/05 - IMPRESSION: Persistent bibasilar opacities, which may represent atelectasis or pneumonia.  Assessment/ Plan: Peritonitis PD catheter related peritonitis - initial cell count 2200, 93% PMNs. Pt initially rec'd IV cefepime, vanc and flagyl. PD fluid cx was negative and initial BCx's done 8/01 were negative.  Repeat blood cx's however done 8/04 were + for GPC and fungus (BCID showing Staph and C. Glabrata). ID consulting and abx changed to IV rocephin and micafungin. PD cath removed 8/05 due to fungemia. Per ccm / ID.  ESRD - PD not working 8/2 --> IR placed temp HD catheter and underwent HD 8/3.  PD cath removed 8/05 due to fungal sepsis. Had iHD on 8/06 via temp cath then it was dc'd for line holiday. Plan HD today/ tonight after  temp cath replaced.  HD access - temp HD cath placed was dc'd yesterday 8/07 for a line holiday. His L BC AVF was created in 2018, not sure if ever used. Unable to access AVF yesterday (very short and deep per RN). CCM to replace temp cath today.    Metabolic encephalopathy - due to sepsis from peritonitis. MS improving.  Acute hypoxic resp failure - resolved BP/volume - BP's a bit soft in 100s, 2.5kg under dry wt. Last CXR diffuse infiltrates can't r/o edema. ^UF to 2.5 L attempt w/ HD today.  Transaminitis  Anemia esrd - Hb 8's, will start darbe 60 mcg weekly while here.  Secondary hyperparathyroidism -corrected calcium 10, no vitamin D, phosphorus 8.2, Renvela binder   Rob   MD  [facility] 02/23/2023, 9:10 AM  Recent Labs  Lab 02/21/23 0407 02/22/23 0023 02/23/23 0352  HGB 8.6* 8.9* 8.6*  ALBUMIN <1.5*  --  <1.5*  CALCIUM 7.4* 7.9* 7.9*  PHOS  --  5.1* 6.2*  CREATININE 8.88* 10.57* 12.36*  K 4.0 4.0 3.9    Inpatient medications:  aspirin EC  81 mg Oral Daily   Chlorhexidine Gluconate Cloth  6 each Topical Daily   clopidogrel  75 mg Oral Daily   feeding supplement  237 mL Oral BID BM   Gerhardt's butt cream   Topical QID   heparin  5,000 Units Subcutaneous Q8H   insulin aspart  0-5 Units Subcutaneous QHS   insulin aspart  0-6 Units Subcutaneous TID WC   ipratropium-albuterol  3 mL Nebulization BID   midodrine  5 mg Oral TID WC   midodrine  5 mg Oral Once in dialysis   mometasone-formoterol  2 puff Inhalation BID   pantoprazole (PROTONIX) IV  40 mg Intravenous Q12H   sevelamer carbonate  2,400 mg Oral TID WC   sodium chloride flush  3 mL Intravenous Q12H    sodium chloride Stopped (02/22/23 1238)   ampicillin-sulbactam (UNASYN) IV Stopped (02/22/23 2221)   [START ON 02/24/2023] micafungin (MYCAMINE) 200 mg in sodium chloride 0.9 % 100 mL IVPB     acetaminophen, albuterol, heparin sodium (porcine) 3,000 Units in dialysis solution 2.5% low-MG/low-CA 6,000 mL dialysis  solution, HYDROmorphone (DILAUDID) injection, loperamide, ondansetron (ZOFRAN) IV, mouth rinse         Labs: Basic Metabolic Panel: Recent Labs  Lab 02/20/23 0538 02/21/23 0407 02/22/23 0023 02/23/23 0352  NA 137 135 134* 137  K 3.1* 4.0 4.0 3.9  CL 99 94* 93* 95*  CO2 23 26 24 24   GLUCOSE 89 81 86 184*  BUN 57* 34* 46* 61*  CREATININE 11.53* 8.88* 10.57* 12.36*  CALCIUM 6.5* 7.4* 7.9* 7.9*  PHOS 5.7*  --  5.1* 6.2*   Liver Function Tests: Recent Labs  Lab 02/18/23 0425 02/19/23 0458 02/21/23 0407 02/23/23 0352  AST 520* 248* 75*  --   ALT 594* 359* 178*  --   ALKPHOS 142* 99 87  --   BILITOT 1.0 1.2 1.2  --   PROT 5.1* 5.0* 5.1*  --   ALBUMIN <1.5* <1.5* <1.5* <1.5*   No results for input(s): "LIPASE", "AMYLASE" in the last 168 hours.  No results for input(s): "AMMONIA" in the last 168 hours.  CBC: Recent Labs  Lab 02/17/23 0351 02/17/23 2332 02/18/23 0425 02/19/23 0458 02/20/23 0126 02/20/23 0538 02/21/23 0407 02/22/23 0023 02/23/23 0352  WBC 6.7   < > 5.1 5.0  --  6.6 8.7 11.9* 9.4  NEUTROABS 5.7  --  4.6 4.7  --   --   --   --   --   HGB 10.4*   < > 9.8* 9.4*   < > 8.7* 8.6* 8.9* 8.6*  HCT 32.3*   < > 29.8* 28.9*   < > 26.4* 26.3* 27.9* 26.8*  MCV 83.9   < > 83.9 83.8  --  83.5 84.0 85.1 87.3  PLT 139*   < > 126*  116* 104*  --  107* 127* 133* 106*   < > = values in this interval not displayed.   Cardiac Enzymes: No results for input(s): "CKTOTAL", "CKMB", "CKMBINDEX", "TROPONINI" in the last 168 hours.  CBG: Recent Labs  Lab 02/22/23 0749 02/22/23 1122 02/22/23 1528 02/22/23 2202 02/23/23 0727  GLUCAP 88 100* 123* 181* 140*    Studies/Results: No results found. Medications:  sodium chloride Stopped (02/22/23 1238)   ampicillin-sulbactam (UNASYN) IV Stopped (02/22/23 2221)   [START ON 02/24/2023] micafungin (MYCAMINE) 200 mg in sodium chloride 0.9 % 100 mL IVPB      aspirin EC  81 mg Oral Daily   Chlorhexidine Gluconate Cloth   6 each Topical Daily   clopidogrel  75 mg Oral Daily   feeding supplement  237 mL Oral BID BM   Gerhardt's butt cream   Topical QID   heparin  5,000 Units Subcutaneous Q8H   insulin  aspart  0-5 Units Subcutaneous QHS   insulin aspart  0-6 Units Subcutaneous TID WC   ipratropium-albuterol  3 mL Nebulization BID   midodrine  5 mg Oral TID WC   midodrine  5 mg Oral Once in dialysis   mometasone-formoterol  2 puff Inhalation BID   pantoprazole (PROTONIX) IV  40 mg Intravenous Q12H   sevelamer carbonate  2,400 mg Oral TID WC   sodium chloride flush  3 mL Intravenous Q12H

## 2023-02-23 NOTE — Procedures (Signed)
Central Venous Hemodialysis Catheter Insertion Procedure Note  [name] [name]  [id number]  12-27-1950  Date:02/23/23  Time:10:57 AM   Provider Performing:Brooke Lodema Hong   Procedure: Insertion of  temporary Non-tunneled Central Venous hemodialysis Catheter(36556) with US guidance (40981)   Indication(s) Hemodialysis  Consent Risks of the procedure as well as the alternatives and risks of each were explained to the patient and/or caregiver.  Consent for the procedure was obtained and is signed in the bedside chart  Anesthesia Topical only with 1% lidocaine   Timeout Verified patient identification, verified procedure, site/side was marked, verified correct patient position, special equipment/implants available, medications/allergies/relevant history reviewed, required imaging and test results available.  Sterile Technique Maximal sterile technique including full sterile barrier drape, hand hygiene, sterile gown, sterile gloves, mask, hair covering, sterile ultrasound probe cover (if used).  Procedure Description Area of catheter insertion was cleaned with chlorhexidine and draped in sterile fashion.  With real-time ultrasound guidance a HD catheter was placed into the right internal jugular vein. Nonpulsatile blood flow and easy flushing noted in all ports.  The catheter was sutured in place and sterile dressing applied.    Complications/Tolerance None; patient tolerated the procedure well. Chest X-ray is ordered to verify placement for internal jugular or subclavian cannulation.   EBL Minimal  Specimen(s) None     Posey Boyer, MSN, NP, AG-ACNP-BC [facility] 02/23/2023, 10:59 AM  See Amion for pager If no response to pager , please call [phone] until 7pm After 7:00 pm call Elink  [phone]

## 2023-02-23 NOTE — Progress Notes (Signed)
NAME:  CARYL FERDINAND, MRN:  [id number], DOB:  [birth year], LOS: 8 ADMISSION DATE:  02/14/2023, CONSULTATION DATE:  02/18/23 REFERRING MD:  Reggie Pile of [facility] MD X-cver, CHIEF COMPLAINT:  SIRS, sepsis, encephalopathy resp distress    History of Present Illness:   72 year old BMI 35.5 obese male with a history of bypass in 2023, COPD not otherwise specified, type 2 diabetes mellitus, acid reflux, ESRD on peritoneal dialysis, hypotension on midodrine.  Sometime mid June 2024 had COVID-19 and treated with Augmentin.  Subsequent to that had cough and poor oral intake and lethargy.  Despite t his mid juoy 2-24 - 1000 feeet in 5 min on room air without desaturation.   Presented to [facility] on 02/14/2023 for confusion and worsening shortness of breath and hypersomnolence.  At the time of admission is requiring 2 L nasal cannula and he had transaminitis.  Chest x-ray suggested pneumonia and a procalcitonin over 150 with a fever of 102.  Started on broad antibiotics and transferred to [facility].  Working diagnosis was peritonitis from his peritoneal dialysis catheter based on cell count 2200 and hazy fluid in the peritoneal dialysate  Subsequent course - 02/16/2023 felt to be responding to antibiotics more awake.  Nursing noticed him to be ambulatory to the bathroom.  Home Xanax and Neurontin were being held.  For his symptomatic transaminitis he had right upper quadrant ultrasound that suggested cirrhosis.  He did have peritoneal dialysis on 02/16/2023  -02/17/2023: Malfunction of peritoneal dialysis catheter with complaints of abdominal pain.  Has subsequent status post temporary HD catheter right internal jugular vein.  He then underwent emergent dialysis and returned back to the unit at around 10 PM 02/17/2023.  Nursing noticed marked difference.  He was less interactive.  He was alert and having a gaze but not following commands.  He would mumble a word.  His temperature was  101.5.  Tachypneic having tremors oxygen needs now at 5 L [.  Hemodialysis was at 4 L] and saturating 100% status post -1.2 L on hemodialysis.  Chest x-ray done showed worsening pulm infiltrates particularly lower lobes compared to admission.  Critical care medicine consulted.  By hospitalist.   Significant Hospital Events:  -02/14/2023 - admit -02/16/2023 felt to be responding to antibiotics more awake.  Nursing noticed him to be ambulatory to the bathroom.  Home Xanax and Neurontin were being held.  For his symptomatic transaminitis he had right upper quadrant ultrasound that suggested cirrhosis.  He did have peritoneal dialysis on 02/16/2023 -02/17/2023: Malfunction of peritoneal dialysis catheter with complaints of abdominal pain.  Has subsequent status post temporary HD catheter right internal jugular vein.  He then underwent emergent dialysis and returned back to the unit at around 10 PM 02/17/2023.  Nursing noticed marked difference.  He was less interactive.  He was alert and having a gaze but not following commands.  He would mumble a word.  His temperature was 101.5.  Tachypneic having tremors oxygen needs now at 5 L [.  Hemodialysis was at 4 L] and saturating 100% status post -1.2 L on hemodialysis.  Chest x-ray done showed worsening pulm infiltrates particularly lower lobes compared to admission.  Critical care medicine consulted.   -02/18/23: early hours - CCM consult -8/5 blood cultures >> Candida glabrata, staph species -8/5 PD catheter removed by vascular surgery -8/6 micafungin.  Ceftriaxone changed to Unasyn based on culture data -8/7 back to ICU w encephalopathy -8/8 line holiday. unable to cannulate  AVF for HD. -8/9 temp HD line placed.    Interim History / Subjective:  Loose stools requiring flexiseal last night. Requiring haldol prn and ativan.   Objective   Blood pressure 107/77, pulse 75, temperature 98.3 F (36.8 C), temperature source Axillary, resp. rate 20, height 5\' 6"  (1.676  m), weight 97.5 kg, SpO2 97%.    FiO2 (%):  [50 %] 50 %   Intake/Output Summary (Last 24 hours) at 02/23/2023 0933 Last data filed at 02/23/2023 0500 Gross per 24 hour  Intake 1144.82 ml  Output 88 ml  Net 1056.82 ml   Filed Weights   02/21/23 2208 02/22/23 0330 02/23/23 0433  Weight: 96.2 kg 96.2 kg 97.5 kg    Examination: Gen:      Laying flat in bed on nasal cannula HEENT:  mmm Lungs:    diminished bibasilar, no wheeze CV:         RRR Abd:      + bowel sounds; soft, non-tender; no palpable masses, no distension Ext:    No edema, LUE AVF +thrill and bruit Skin:      Warm and dry; no rashes Neuro:   off sedation, intermittently agitated requiring restraints, follows commands   Labs and imaging reviewed Na 137 K 3.9 Cr 12.36 BUN 61 Phos 6.2  Alb<1.5  Resolved Hospital Problem list   Tremor  Assessment & Plan:  Acute metabolic/septic encephalopathy - treat sepsis as below -Minimize sedating/mind altering medications as able.  He may require some intermittent Ativan or possibly even Precedex to allow care -His home Xanax and gabapentin remain on hold (probable outside of window for withdrawal so wil continue to hold - has been having encephalopathy following HD, ? If he is having some watershed distribution hypoperfusion during HD.  - continue to monitor in ICU today with HD trial.   Sepsis due to peritonitis, Candida glabrata and multi organism bacteremia (Enterococcus, staph hemolyticus) -Continue micafungin, ceftriaxone was changed to Unasyn on 8/6.  - suspected source PD catheter removed. Temp HD catheter is now also out. Currently on line holiday - appreciate ID consultation -TTE reviewed, negative for vegetation, will need TEE - ophtho eval negative for endophthalmitis  Acute hypoxemic respiratory failure, currently on 5LNC< will titrate down for goal sats >90% Acute pulmonary edema due to volume overload from ESRD Suspected OHS/OSA - Question whether he may  require BiPAP nightly long-term -chest xray reviewed - cardiomegaly and acute pulmonary edema -Careful volume removal with HD per nephrology plans  End-stage renal disease on peritoneal dialysis in [place].  PD catheter out. Insertion of new hemodialysis catheter 8/3, removed 8/6 -Appreciate nephrology management.  Will need to discuss possibility that one of his stressors and reasons for decompensation is his ability to tolerate HD -Agree with line holiday -Follow BMP - trial of HD today while in ICU - will need temp line today - continue midodrine 5 mg TID. Will increase midodrine with dialysis to see if goal SBP>100 will help encephalopathy  Transaminitis, suspect shock liver -Follow LFT and coags  Protein calorie malnutrition - appreciate RD input, liberalizing diet  Best practice (daily eval):  Diet: regular diet, no fluid restrictions based on RD evaluation 8/8 Pain/Anxiety/Delirium protocol (if indicated): x VAP protocol (if indicated): x DVT prophylaxis: heparin sq GI prophylaxis:  PPI Glucose control: SSI  Mobility: bed rest Code Status: full code Family Communication: called mother but no answer, I called daughter crystal 8/9 to consent for HD line and to give an update Disposition:  continue to monitor in ICU today  SIGNATURE   The patient is critically ill due to encephalopathy.  Critical care was necessary to treat or prevent imminent or life-threatening deterioration.  Critical care was time spent personally by me on the following activities: development of treatment plan with patient and/or surrogate as well as nursing, discussions with consultants, evaluation of patient's response to treatment, examination of patient, obtaining history from patient or surrogate, ordering and performing treatments and interventions, ordering and review of laboratory studies, ordering and review of radiographic studies, pulse oximetry, re-evaluation of patient's condition and  participation in multidisciplinary rounds.   Critical Care Time devoted to patient care services described in this note is 32 minutes. This time reflects time of care of this signee Charlott Holler . This critical care time does not reflect separately billable procedures or procedure time, teaching time or supervisory time of PA/NP/Med student/Med Resident etc but could involve care discussion time.       Charlott Holler [facility] 02/23/2023 9:33 AM  Pager: see AMION  If no response to pager , please call critical care on call (see AMION) until 7pm After 7:00 pm call Elink

## 2023-02-23 NOTE — Progress Notes (Signed)
Pharmacy Antibiotic Note  [name] [name] is a 72 y.o. male admitted on 02/14/2023 with concern for infected PD cath/peritonitis and now found to have candida glabrata, E avium, and staph haemolyticus in blood cultures. Pharmacy has been consulted for Unasyn dosing. Also on micafungin per MD. PD transitioned to IHD.  PD catheter removed 8/5. Plan temp cath replacement 8/9.  Plan: Unasyn 3g IV every 12 hours Micafungin 200mg  IV q24h Will continue to follow HD schedule/duration, culture results, LOT, and antibiotic de-escalation plans   Height: 5\' 6"  (167.6 cm) Weight: 97.5 kg (214 lb 15.2 oz) IBW/kg (Calculated) : 63.8  Temp (24hrs), Avg:98.3 F (36.8 C), Min:97.6 F (36.4 C), Max:99.6 F (37.6 C)  Recent Labs  Lab 02/17/23 2332 02/18/23 0425 02/19/23 0458 02/20/23 0538 02/21/23 0407 02/22/23 0023 02/23/23 0352  WBC 5.4   < > 5.0 6.6 8.7 11.9* 9.4  CREATININE 8.36*   < > 11.66* 11.53* 8.88* 10.57* 12.36*  LATICACIDVEN 1.6  --   --   --   --   --   --   VANCORANDOM  --   --  26  --   --   --   --    < > = values in this interval not displayed.    Estimated Creatinine Clearance: 5.9 mL/min (A) (by C-G formula based on SCr of 12.36 mg/dL (H)).    Allergies  Allergen Reactions   Opana [Oxymorphone Hcl] Itching   Tramadol Itching    Leia Alf, PharmD, BCPS Please check AMION for all [facility] contact numbers Clinical Pharmacist 02/23/2023 10:09 AM

## 2023-02-23 NOTE — Plan of Care (Signed)
  Problem: Education: Goal: Knowledge of disease or condition will improve Outcome: Not Progressing Goal: Knowledge of the prescribed therapeutic regimen will improve Outcome: Not Progressing   Problem: Activity: Goal: Ability to tolerate increased activity will improve Outcome: Progressing   Problem: Respiratory: Goal: Ability to maintain a clear airway will improve Outcome: Progressing

## 2023-02-24 DIAGNOSIS — Z992 Dependence on renal dialysis: Secondary | ICD-10-CM | POA: Diagnosis not present

## 2023-02-24 DIAGNOSIS — N186 End stage renal disease: Secondary | ICD-10-CM | POA: Diagnosis not present

## 2023-02-24 DIAGNOSIS — G9340 Encephalopathy, unspecified: Secondary | ICD-10-CM | POA: Diagnosis not present

## 2023-02-24 LAB — GLUCOSE, CAPILLARY
Glucose-Capillary: 133 mg/dL — ABNORMAL HIGH (ref 70–99)
Glucose-Capillary: 126 mg/dL — ABNORMAL HIGH (ref 70–99)
Glucose-Capillary: 144 mg/dL — ABNORMAL HIGH (ref 70–99)
Glucose-Capillary: 132 mg/dL — ABNORMAL HIGH (ref 70–99)

## 2023-02-24 LAB — POCT I-STAT 7, (LYTES, BLD GAS, ICA,H+H)
Acid-Base Excess: 5 mmol/L — ABNORMAL HIGH (ref 0.0–2.0)
Bicarbonate: 29.5 mmol/L — ABNORMAL HIGH (ref 20.0–28.0)
Calcium, Ion: 1.1 mmol/L — ABNORMAL LOW (ref 1.15–1.40)
HCT: 27 % — ABNORMAL LOW (ref 39.0–52.0)
Hemoglobin: 9.2 g/dL — ABNORMAL LOW (ref 13.0–17.0)
O2 Saturation: 99 %
Patient temperature: 98.6
Potassium: 3.8 mmol/L (ref 3.5–5.1)
Sodium: 135 mmol/L (ref 135–145)
TCO2: 31 mmol/L (ref 22–32)
pCO2 arterial: 44.3 mmHg (ref 32–48)
pH, Arterial: 7.431 (ref 7.35–7.45)
pO2, Arterial: 122 mmHg — ABNORMAL HIGH (ref 83–108)

## 2023-02-24 MED ORDER — DEXMEDETOMIDINE HCL IN NACL 400 MCG/100ML IV SOLN: 0.0000 ug/kg/h | INTRAVENOUS | Status: DC

## 2023-02-24 MED ORDER — MAGNESIUM SULFATE 2 GM/50ML IV SOLN
2.0000 g | Freq: Once | INTRAVENOUS | Status: AC
  Administered 2023-02-24: 2 g via INTRAVENOUS
  Filled 2023-02-24: qty 50

## 2023-02-24 MED ORDER — MIDODRINE HCL 5 MG PO TABS: 10.0000 mg | ORAL_TABLET | ORAL | Status: DC

## 2023-02-24 MED ORDER — DEXMEDETOMIDINE HCL IN NACL 400 MCG/100ML IV SOLN
INTRAVENOUS | Status: AC
  Filled 2023-02-24: qty 100

## 2023-02-24 MED ORDER — DEXMEDETOMIDINE HCL IN NACL 400 MCG/100ML IV SOLN
0.0000 ug/kg/h | INTRAVENOUS | Status: DC
  Administered 2023-02-24: 0.4 ug/kg/h via INTRAVENOUS
  Filled 2023-02-24 (×2): qty 100

## 2023-02-24 MED ORDER — CALCIUM GLUCONATE-NACL 2-0.675 GM/100ML-% IV SOLN
2.0000 g | Freq: Once | INTRAVENOUS | Status: AC
  Administered 2023-02-24: 2000 mg via INTRAVENOUS
  Filled 2023-02-24: qty 100

## 2023-02-24 MED ORDER — PANTOPRAZOLE SODIUM 40 MG PO TBEC
40.0000 mg | DELAYED_RELEASE_TABLET | Freq: Two times a day (BID) | ORAL | Status: DC
  Administered 2023-02-24 – 2023-02-26 (×4): 40 mg via ORAL
  Filled 2023-02-24 (×4): qty 1

## 2023-02-24 NOTE — Progress Notes (Signed)
NAME:  [name] [name], MRN:  [id number], DOB:  [birth year], LOS: 9 ADMISSION DATE:  02/14/2023, CONSULTATION DATE:  02/18/23 REFERRING MD:  Reggie Pile of [place] MD X-cver, CHIEF COMPLAINT:  SIRS, sepsis, encephalopathy resp distress    History of Present Illness:   72 year old BMI 35.5 obese male with a history of bypass in 2023, COPD not otherwise specified, type 2 diabetes mellitus, acid reflux, ESRD on peritoneal dialysis, hypotension on midodrine.  Sometime mid June 2024 had COVID-19 and treated with Augmentin.  Subsequent to that had cough and poor oral intake and lethargy.    Presented to [facility] on 02/14/2023 for confusion and worsening shortness of breath and hypersomnolence.  At the time of admission is requiring 2 L nasal cannula and he had transaminitis.  Chest x-ray suggested pneumonia and a procalcitonin over 150 with a fever of 102.  Started on broad antibiotics and transferred to [facility] campus.  Working diagnosis was peritonitis from his peritoneal dialysis catheter based on cell count 2200 and hazy fluid in the peritoneal dialysate  Subsequent course - 02/16/2023 felt to be responding to antibiotics more awake.  Nursing noticed him to be ambulatory to the bathroom.  Home Xanax and Neurontin were being held.  For his symptomatic transaminitis he had right upper quadrant ultrasound that suggested cirrhosis.  He did have peritoneal dialysis on 02/16/2023  -02/17/2023: Malfunction of peritoneal dialysis catheter with complaints of abdominal pain.  Has subsequent status post temporary HD catheter right internal jugular vein.  He then underwent emergent dialysis and returned back to the unit at around 10 PM 02/17/2023.  Nursing noticed marked difference.  He was less interactive.  He was alert and having a gaze but not following commands.  He would mumble a word.  His temperature was 101.5.  Tachypneic having tremors oxygen needs now at 5 L [.  Hemodialysis was at 4 L] and  saturating 100% status post -1.2 L on hemodialysis.  Chest x-ray done showed worsening pulm infiltrates particularly lower lobes compared to admission.  Critical care medicine consulted.  By hospitalist.   Significant Hospital Events:  -02/14/2023 - admit -02/16/2023 felt to be responding to antibiotics more awake.  Nursing noticed him to be ambulatory to the bathroom.  Home Xanax and Neurontin were being held.  For his symptomatic transaminitis he had right upper quadrant ultrasound that suggested cirrhosis.  He did have peritoneal dialysis on 02/16/2023 -02/17/2023: Malfunction of peritoneal dialysis catheter with complaints of abdominal pain.  Has subsequent status post temporary HD catheter right internal jugular vein.  He then underwent emergent dialysis and returned back to the unit at around 10 PM 02/17/2023.  Nursing noticed marked difference.  He was less interactive.  He was alert and having a gaze but not following commands.  He would mumble a word.  His temperature was 101.5.  Tachypneic having tremors oxygen needs now at 5 L [.  Hemodialysis was at 4 L] and saturating 100% status post -1.2 L on hemodialysis.  Chest x-ray done showed worsening pulm infiltrates particularly lower lobes compared to admission.  Critical care medicine consulted.   -02/18/23: early hours - CCM consult -8/5 blood cultures >> Candida glabrata, staph species -8/5 PD catheter removed by vascular surgery -8/6 micafungin.  Ceftriaxone changed to Unasyn based on culture data -8/7 back to ICU w encephalopathy -8/8 line holiday. unable to cannulate AVF for HD. -8/9 temp HD line placed.    Interim History / Subjective:  Continues to  have loose stools, rectal tube in place Gets agitated and restless, started pulling out lines and tubes Remained afebrile  Objective   Blood pressure 98/72, pulse 85, temperature 100.1 F (37.8 C), temperature source Axillary, resp. rate 19, height 5\' 6"  (1.676 m), weight 96.5 kg, SpO2 98%.         Intake/Output Summary (Last 24 hours) at 02/24/2023 1102 Last data filed at 02/24/2023 0300 Gross per 24 hour  Intake 1049.82 ml  Output 19586.25 ml  Net -18536.43 ml   Filed Weights   02/22/23 0330 02/23/23 0433 02/24/23 0332  Weight: 96.2 kg 97.5 kg 96.5 kg    Examination: General: Acute on chronically ill-appearing obese male, lying on the bed, on BiPAP HEENT: [place]/AT, eyes anicteric.  moist mucus membranes Neuro:, Confused, restless and agitated Chest: Reduced air entry at the bases bilaterally, no wheezes or rhonchi Heart: Regular rate and rhythm, no murmurs or gallops Abdomen: Soft, nontender, nondistended, bowel sounds present Skin: No rash  Labs and imaging reviewed Na 133 K 3.5 Cr 6.82 BUN 26 Phos 3.4  Alb<1.5  Resolved Hospital Problem list   Tremor  Assessment & Plan:  Acute metabolic/septic encephalopathy In the setting of uremia and peritonitis Infection is being treated Minimize sedation Had to be restarted back on Precedex due to agitation and restlessness Received a dialysis yesterday with improvement in BUN His home Xanax and gabapentin remain on hold (probable outside of window for withdrawal so wil   Sepsis due to peritonitis, Candida glabrata and multi organism bacteremia (Enterococcus, staph hemolyticus), POA Continue micafungin, and Unasyn Suspected source PD catheter removed Appreciate ID follow-up TTE reviewed, negative for vegetation, will need TEE probably next week Ophthalmology eval negative for endophthalmitis  Acute hypoxemic respiratory failure, currently on BiPAP  Acute pulmonary edema due to volume overload from ESRD, improved Suspected OHS/OSA Continue BiPAP, transition to high flow nasal cannula oxygen later today  End-stage renal disease on peritoneal dialysis in [place].  PD catheter out. Nephrology is following Received hemodialysis yesterday Trend BMP and electrolytes Continue midodrine  Chronic biventricular  HFrEF Echo showed EF 45% with hypokinesis of RV Monitor intake and output GDMT as tolerated  Shock liver Monitor LFTs  Protein calorie malnutrition Continue dietary supplements  Hypervolemic hyponatremia Closely monitor electrolytes  Anemia and thrombocytopenia of critical illness Monitor H&H and platelet count Watch for signs of bleeding  Best practice (daily eval):  Diet: regular diet, no fluid restrictions based on RD evaluation 8/8 Pain/Anxiety/Delirium protocol (if indicated): x VAP protocol (if indicated): x DVT prophylaxis: heparin sq GI prophylaxis:  PPI Glucose control: SSI  Mobility: bed rest Code Status: full code Family Communication: called mother but no answer, I called daughter crystal 8/9 to consent for HD line and to give an update Disposition: continue to monitor in ICU today  The patient is critically ill due to acute encephalopathy, sepsis due to peritonitis.  Critical care was necessary to treat or prevent imminent or life-threatening deterioration.  Critical care was time spent personally by me on the following activities: development of treatment plan with patient and/or surrogate as well as nursing, discussions with consultants, evaluation of patient's response to treatment, examination of patient, obtaining history from patient or surrogate, ordering and performing treatments and interventions, ordering and review of laboratory studies, ordering and review of radiographic studies, pulse oximetry, re-evaluation of patient's condition and participation in multidisciplinary rounds.   During this encounter critical care time was devoted to patient care services described in this note for 36  minutes.     Cheri Fowler, MD [facility] See Amion for pager If no response to pager, please call [phone] until 7pm After 7pm, Please call E-link [phone]

## 2023-02-24 NOTE — Plan of Care (Signed)
  Problem: Education: Goal: Knowledge of disease or condition will improve Outcome: Not Progressing Goal: Knowledge of the prescribed therapeutic regimen will improve Outcome: Not Progressing   Problem: Activity: Goal: Ability to tolerate increased activity will improve Outcome: Not Progressing   Problem: Respiratory: Goal: Ability to maintain a clear airway will improve Outcome: Progressing Goal: Ability to maintain adequate ventilation will improve Outcome: Progressing

## 2023-02-24 NOTE — Plan of Care (Signed)
  Problem: Nutritional: Goal: Maintenance of adequate nutrition will improve Outcome: Not Progressing Goal: Progress toward achieving an optimal weight will improve Outcome: Not Progressing   Problem: Skin Integrity: Goal: Risk for impaired skin integrity will decrease Outcome: Not Progressing   Problem: Education: Goal: Knowledge of General Education information will improve Description: Including pain rating scale, medication(s)/side effects and non-pharmacologic comfort measures Outcome: Not Progressing   Problem: Pain Managment: Goal: General experience of comfort will improve Outcome: Progressing   Problem: Safety: Goal: Ability to remain free from injury will improve Outcome: Progressing

## 2023-02-24 NOTE — Progress Notes (Signed)
Subjective: had HD yesterday using new temp cath. UF 2.5 L net. Labs better today.   Objective Vital signs in last 24 hours: Vitals:   02/24/23 0536 02/24/23 0600 02/24/23 0630 02/24/23 0729  BP:  90/65 98/72   Pulse: (!) 112 81 85   Resp: (!) 22 18 19    Temp:    100.1 F (37.8 C)  TempSrc:    Axillary  SpO2: 98% 98% 98%   Weight:      Height:       Weight change: -1 kg  Physical Exam: General: more alert today Heart: RRR no MRG Lungs: no coughing, no rhonchi, no rales Abdomen: soft, obese, PD cath has been removed Extremities: No pedal edema LUA AVF +positive thrill  temp HD cath in place Neuro: as above   Home med --> albuterol, xanax, aspirin, aotrvastatin, clopidogrel, esomeprazole, gabapentin, insulin glargine, linagliptin, midodrine   Outpatient PD orders:  Followed by [facility] (note his PD RN was on vacation  but nephrology called [facility] [place] location for orders  [facility] - [facility] - 100kg 5 cycles, 3000 fill vol (note pt states on consult he thinks is 2.5 liters), total time: 10 hrs, last fill 1500 cc Mircera 150 mcg q monthly Venofer 100mg  once a month L AVF +thrill but small + tortuous    CXR 8/05 - IMPRESSION: Persistent bibasilar opacities, which may represent atelectasis or pneumonia.  Assessment/ Plan: Peritonitis PD catheter related peritonitis - initial cell count 2200, 93% PMNs. Pt initially rec'd IV cefepime, vanc and flagyl. PD fluid cx was negative and initial BCx's done 8/01 were negative.  Repeat blood cx's however done 8/04 were + for GPC and fungus (BCID showing Staph and C. Glabrata). ID consulting and abx changed to IV rocephin and micafungin. PD cath removed 8/05 due to fungemia. Per ccm / ID.  ESRD - pt rec'd PD initially, then PD cath pulled due to infection. Has had HD here 2x this week, plan HD again today, then will stay on TTS schedule while here.  HD access - PD not working 8/2 --> IR placed temp HD catheter  and underwent HD 8/3.  PD cath removed 8/05 due to fungal sepsis. Had iHD on 8/06 via temp cath then it was dc'd for line holiday. L arm AVF was not usable when we tried on 8/08. Temp cath replaced 8/09 by CCM, line holiday completed. Will need tunneled cath sometime next week w/ ID's approval.  Acute hypoxic resp failure - w/ pulm edema, max UF today. Has lost body wt here.  BP/volume - BP's low normal, CXR recently bilat infiltrates /edema. Max UF goal w/ HD today. Adding pre hd midodrine 10mg .  Anemia esrd - Hb 8's, will start darbe 60 mcg weekly while here.  Secondary hyperparathyroidism -corrected calcium 10, no vitamin D, phosphorus 8.2, Renvela binder   Vinson Moselle  MD  [facility] 02/24/2023, 9:55 AM  Recent Labs  Lab 02/23/23 0352 02/24/23 0333  HGB 8.6* 8.3*  ALBUMIN <1.5* <1.5*  CALCIUM 7.9* 7.7*  PHOS 6.2* 3.4  CREATININE 12.36* 6.82*  K 3.9 3.5    Inpatient medications:  aspirin EC  81 mg Oral Daily   Chlorhexidine Gluconate Cloth  6 each Topical Daily   clopidogrel  75 mg Oral Daily   darbepoetin (ARANESP) injection - DIALYSIS  60 mcg Subcutaneous Q Fri-1800   feeding supplement  237 mL Oral BID BM   Gerhardt's butt cream   Topical QID   heparin  5,000 Units Subcutaneous Q8H   insulin aspart  0-5 Units Subcutaneous QHS   insulin aspart  0-6 Units Subcutaneous TID WC   ipratropium-albuterol  3 mL Nebulization BID   midodrine  5 mg Oral TID WC   mometasone-formoterol  2 puff Inhalation BID   pantoprazole (PROTONIX) IV  40 mg Intravenous Q12H   sevelamer carbonate  2,400 mg Oral TID WC   sodium chloride flush  3 mL Intravenous Q12H    sodium chloride Stopped (02/22/23 1238)   ampicillin-sulbactam (UNASYN) IV Stopped (02/23/23 2117)   micafungin (MYCAMINE) 200 mg in sodium chloride 0.9 % 100 mL IVPB Stopped (02/24/23 0259)   acetaminophen, albuterol, heparin, heparin, heparin sodium (porcine), heparin sodium (porcine), HYDROmorphone (DILAUDID) injection, loperamide,  ondansetron (ZOFRAN) IV, mouth rinse         Labs: Basic Metabolic Panel: Recent Labs  Lab 02/22/23 0023 02/23/23 0352 02/24/23 0333  NA 134* 137 133*  K 4.0 3.9 3.5  CL 93* 95* 95*  CO2 24 24 26   GLUCOSE 86 184* 147*  BUN 46* 61* 26*  CREATININE 10.57* 12.36* 6.82*  CALCIUM 7.9* 7.9* 7.7*  PHOS 5.1* 6.2* 3.4   Liver Function Tests: Recent Labs  Lab 02/18/23 0425 02/19/23 0458 02/21/23 0407 02/23/23 0352 02/24/23 0333  AST 520* 248* 75*  --   --   ALT 594* 359* 178*  --   --   ALKPHOS 142* 99 87  --   --   BILITOT 1.0 1.2 1.2  --   --   PROT 5.1* 5.0* 5.1*  --   --   ALBUMIN <1.5* <1.5* <1.5* <1.5* <1.5*   No results for input(s): "LIPASE", "AMYLASE" in the last 168 hours.  No results for input(s): "AMMONIA" in the last 168 hours.  CBC: Recent Labs  Lab 02/18/23 0425 02/19/23 0458 02/20/23 0126 02/20/23 0538 02/21/23 0407 02/22/23 0023 02/23/23 0352 02/24/23 0333  WBC 5.1 5.0  --  6.6 8.7 11.9* 9.4 10.2  NEUTROABS 4.6 4.7  --   --   --   --   --   --   HGB 9.8* 9.4*   < > 8.7* 8.6* 8.9* 8.6* 8.3*  HCT 29.8* 28.9*   < > 26.4* 26.3* 27.9* 26.8* 26.2*  MCV 83.9 83.8  --  83.5 84.0 85.1 87.3 85.3  PLT 126*  116* 104*  --  107* 127* 133* 106* 126*   < > = values in this interval not displayed.   Cardiac Enzymes: No results for input(s): "CKTOTAL", "CKMB", "CKMBINDEX", "TROPONINI" in the last 168 hours.  CBG: Recent Labs  Lab 02/23/23 0727 02/23/23 1125 02/23/23 1529 02/23/23 2121 02/24/23 0728  GLUCAP 140* 145* 157* 137* 133*    Studies/Results: DG CHEST PORT 1 VIEW  Result Date: 02/23/2023 CLINICAL DATA:  Dialysis/dialysis catheter care EXAM: PORTABLE CHEST 1 VIEW COMPARISON:  02/21/2023 FINDINGS: Right internal jugular central venous catheter tip: SVC. Prior CABG. The patient is rotated to the right on today's radiograph, reducing diagnostic sensitivity and specificity. Continued indistinct opacities at the lung bases, with interstitial  predominance at the left lung base but some bandlike airspace opacities at the right lung base. Overall this is similar to previous. Suspected mild underlying cardiomegaly. IMPRESSION: 1. Right internal jugular central venous catheter tip: SVC. No pneumothorax. 2. Continued indistinct opacities at the lung bases. 3. Suspected mild cardiomegaly. 4. Prior CABG. Electronically Signed   By: Gaylyn Rong M.D.   On: 02/23/2023 12:13   Medications:  sodium chloride  Stopped (02/22/23 1238)   ampicillin-sulbactam (UNASYN) IV Stopped (02/23/23 2117)   micafungin (MYCAMINE) 200 mg in sodium chloride 0.9 % 100 mL IVPB Stopped (02/24/23 0259)    aspirin EC  81 mg Oral Daily   Chlorhexidine Gluconate Cloth  6 each Topical Daily   clopidogrel  75 mg Oral Daily   darbepoetin (ARANESP) injection - DIALYSIS  60 mcg Subcutaneous Q Fri-1800   feeding supplement  237 mL Oral BID BM   Gerhardt's butt cream   Topical QID   heparin  5,000 Units Subcutaneous Q8H   insulin aspart  0-5 Units Subcutaneous QHS   insulin aspart  0-6 Units Subcutaneous TID WC   ipratropium-albuterol  3 mL Nebulization BID   midodrine  5 mg Oral TID WC   mometasone-formoterol  2 puff Inhalation BID   pantoprazole (PROTONIX) IV  40 mg Intravenous Q12H   sevelamer carbonate  2,400 mg Oral TID WC   sodium chloride flush  3 mL Intravenous Q12H

## 2023-02-24 NOTE — Progress Notes (Signed)
RT called to room, pt saturating 84%. Pt has no increased work of breathing, pt head of bed raised, sat probe changed, mouth and back of throat suctioned. Pt saturation increased to 94%, but pt is a mouth breather and refuses BIPAP. RT will monitor, nurse aware.

## 2023-02-24 NOTE — Progress Notes (Signed)
PT Cancellation Note  Patient Details Name: [name] [name] MRN: [id number] DOB: 11/20/1950   Cancelled Treatment:    Reason Eval/Treat Not Completed: Medical issues which prohibited therapy Spoke with RN, pt with sats dropping into 60's and just had to be placed on Bipap.  Will hold PT today. Anise Salvo, PT Acute Rehab [facility] Rehab [phone]   Rayetta Humphrey 02/24/2023, 1:54 PM

## 2023-02-24 NOTE — Progress Notes (Signed)
Received patient at bedside in ICU.  Alert and oriented to person.  Informed consent signed and in chart.   TX duration: 3.5 Hours  Patient tolerated well.  Patient asleep, resting and stable condition.  Alert, without acute distress.  Hand-off given to patient's nurse.   Access used: RIJ TDC Access issues: LUE AVF cannulation successful X 1 attempt. Patient moved arm prior to line connect, causing displacement of arterial needle; attempts to reestablish placement unsuccessful. RIJ TDC accessed and functioned without issue for this treatment.   Total UF removed: 2500 m: Medication(s) given: Heparin bolus 4000 Units at start of treatment. Post HD VS: see Data Insert Post HD weight: Unable to obtain      02/24/23 0130  Vitals  Temp 100 F (37.8 C)  Temp Source Axillary  BP 110/84  MAP (mmHg) 90  BP Location Right Leg  BP Method Automatic  Patient Position (if appropriate) Lying  Pulse Rate (!) 49  Pulse Rate Source Monitor  ECG Heart Rate 97  Resp 19  Oxygen Therapy  SpO2 (!) 86 %  O2 Device HFNC  O2 Flow Rate (L/min) 6 L/min  Patient Activity (if Appropriate) In bed  Pulse Oximetry Type Continuous  During Treatment Monitoring  Blood Flow Rate (mL/min) 0 mL/min  Arterial Pressure (mmHg) -1.82 mmHg  Venous Pressure (mmHg) -32.93 mmHg  TMP (mmHg) 4.44 mmHg  Ultrafiltration Rate (mL/min) 963 mL/min  Dialysate Flow Rate (mL/min) 300 ml/min  Dialysate Potassium Concentration 3  Dialysate Calcium Concentration 2.5  Post Treatment  Dialyzer Clearance Lightly streaked  Duration of HD Treatment -hour(s) 3.5 hour(s)  Hemodialysis Intake (mL) 0 mL  Liters Processed 84  Fluid Removed (mL) 2500.18 mL  Tolerated HD Treatment Yes  Post-Hemodialysis Comments Treatment copleted without issue.  Note  Patient Observations Patient resting, VSS, no acute distress noted, condition stable for this d/c.  Fistula / Graft Left Other (Comment) Arteriovenous fistula  Placement  Date/Time: 08/09/16 1327   Placed prior to admission: No  Orientation: Left  Access Location: (c) Other (Comment)  Access Type: Arteriovenous fistula  Site Condition No complications  Fistula / Graft Assessment Present;Thrill;Bruit  Status Deaccessed  Drainage Description None  Hemodialysis Catheter Right Internal jugular Triple lumen Temporary (Non-Tunneled)  Placement Date/Time: 02/23/23 1100   Maximum sterile barrier precautions: Hand hygiene;Cap;Mask;Sterile gown;Sterile gloves;Large sterile sheet;Sterile probe cover  Site Prep: Chlorhexidine (preferred)  Local Anesthetic: Injectable - 1% Lidocaine  Ult...  Site Condition No complications  Blue Lumen Status Flushed;Heparin locked;Dead end cap in place  Red Lumen Status Flushed;Heparin locked;Dead end cap in place  Purple Lumen Status Other (Comment) (Not in Use)  Catheter fill solution Heparin 1000 units/ml  Catheter fill volume (Arterial) 1.2 cc  Catheter fill volume (Venous) 1.2  Dressing Type Transparent  Dressing Status Antimicrobial disc in place;Clean, Dry, Intact  Drainage Description None  Post treatment catheter status Capped and Clamped      [facility]

## 2023-02-25 DIAGNOSIS — I214 Non-ST elevation (NSTEMI) myocardial infarction: Secondary | ICD-10-CM

## 2023-02-25 DIAGNOSIS — G9340 Encephalopathy, unspecified: Secondary | ICD-10-CM | POA: Diagnosis not present

## 2023-02-25 DIAGNOSIS — N186 End stage renal disease: Secondary | ICD-10-CM | POA: Diagnosis not present

## 2023-02-25 DIAGNOSIS — Z951 Presence of aortocoronary bypass graft: Secondary | ICD-10-CM

## 2023-02-25 LAB — RENAL FUNCTION PANEL
Albumin: <1.5 g/dL — ABNORMAL LOW (ref 3.5–5.0)
Anion gap: 12 (ref 5–15)
BUN: 12 mg/dL (ref 8–23)
CO2: 26 mmol/L (ref 22–32)
Calcium: 7.5 mg/dL — ABNORMAL LOW (ref 8.9–10.3)
Chloride: 97 mmol/L — ABNORMAL LOW (ref 98–111)
Creatinine, Ser: 3.48 mg/dL — ABNORMAL HIGH (ref 0.61–1.24)
GFR, Estimated: 18 mL/min — ABNORMAL LOW (ref >60)
Glucose, Bld: 126 mg/dL — ABNORMAL HIGH (ref 70–99)
Phosphorus: 2.5 mg/dL (ref 2.5–4.6)
Potassium: 3.3 mmol/L — ABNORMAL LOW (ref 3.5–5.1)
Sodium: 135 mmol/L (ref 135–145)

## 2023-02-25 LAB — CBC
HCT: 28.3 % — ABNORMAL LOW (ref 39.0–52.0)
Hemoglobin: 9 g/dL — ABNORMAL LOW (ref 13.0–17.0)
MCH: 27.7 pg (ref 26.0–34.0)
MCHC: 31.8 g/dL (ref 30.0–36.0)
MCV: 87.1 fL (ref 80.0–100.0)
Platelets: 157 10*3/uL (ref 150–400)
RBC: 3.25 MIL/uL — ABNORMAL LOW (ref 4.22–5.81)
RDW: 19.5 % — ABNORMAL HIGH (ref 11.5–15.5)
WBC: 9.5 10*3/uL (ref 4.0–10.5)
nRBC: 2.2 % — ABNORMAL HIGH (ref 0.0–0.2)

## 2023-02-25 LAB — CULTURE, BLOOD (ROUTINE X 2): Special Requests: ADEQUATE

## 2023-02-25 LAB — GLUCOSE, CAPILLARY
Glucose-Capillary: 121 mg/dL — ABNORMAL HIGH (ref 70–99)
Glucose-Capillary: 139 mg/dL — ABNORMAL HIGH (ref 70–99)
Glucose-Capillary: 143 mg/dL — ABNORMAL HIGH (ref 70–99)
Glucose-Capillary: 162 mg/dL — ABNORMAL HIGH (ref 70–99)

## 2023-02-25 LAB — MAGNESIUM: Magnesium: 1.8 mg/dL (ref 1.7–2.4)

## 2023-02-25 MED ORDER — CHLORHEXIDINE GLUCONATE CLOTH 2 % EX PADS: 6.0000 | MEDICATED_PAD | Freq: Every day | CUTANEOUS | Status: DC

## 2023-02-25 MED ORDER — MIDODRINE HCL 5 MG PO TABS
10.0000 mg | ORAL_TABLET | ORAL | Status: DC
  Administered 2023-02-25: 10 mg via ORAL
  Filled 2023-02-25 (×2): qty 2

## 2023-02-25 MED ORDER — MAGNESIUM SULFATE 2 GM/50ML IV SOLN
2.0000 g | Freq: Once | INTRAVENOUS | Status: AC
  Administered 2023-02-25: 2 g via INTRAVENOUS
  Filled 2023-02-25: qty 50

## 2023-02-25 MED ORDER — NOREPINEPHRINE 16 MG/250ML-% IV SOLN: 0.0000 ug/min | INTRAVENOUS | Status: DC

## 2023-02-25 MED ORDER — CIPROFLOXACIN HCL 500 MG PO TABS
500.0000 mg | ORAL_TABLET | Freq: Every day | ORAL | Status: DC
  Administered 2023-02-25 – 2023-03-01 (×5): 500 mg via ORAL
  Filled 2023-02-25 (×6): qty 1

## 2023-02-25 MED ORDER — POTASSIUM CHLORIDE 10 MEQ/100ML IV SOLN
10.0000 meq | INTRAVENOUS | Status: AC
  Administered 2023-02-25 (×2): 10 meq via INTRAVENOUS
  Filled 2023-02-25 (×2): qty 100

## 2023-02-25 MED ORDER — MIDODRINE HCL 5 MG PO TABS
10.0000 mg | ORAL_TABLET | Freq: Three times a day (TID) | ORAL | Status: DC
  Administered 2023-02-25 – 2023-02-27 (×6): 10 mg via ORAL
  Filled 2023-02-25 (×8): qty 2

## 2023-02-25 MED ORDER — HALOPERIDOL LACTATE 5 MG/ML IJ SOLN
2.0000 mg | Freq: Four times a day (QID) | INTRAMUSCULAR | Status: DC | PRN
  Administered 2023-02-25 – 2023-03-01 (×7): 2 mg via INTRAVENOUS
  Filled 2023-02-25 (×8): qty 1

## 2023-02-25 MED ORDER — MIDODRINE HCL 5 MG PO TABS: 10.0000 mg | ORAL_TABLET | ORAL | Status: DC | PRN

## 2023-02-25 NOTE — Progress Notes (Signed)
NAME:  MANDY ENGLIN, MRN:  [id number], DOB:  Jul 17, 1951, LOS: 10 ADMISSION DATE:  02/14/2023, CONSULTATION DATE:  02/18/23 REFERRING MD:  Reggie Pile of [facility] MD X-cver, CHIEF COMPLAINT:  SIRS, sepsis, encephalopathy resp distress    History of Present Illness:   72 year old BMI 35.5 obese male with a history of bypass in 2023, COPD not otherwise specified, type 2 diabetes mellitus, acid reflux, ESRD on peritoneal dialysis, hypotension on midodrine.  Sometime mid June 2024 had COVID-19 and treated with Augmentin.  Subsequent to that had cough and poor oral intake and lethargy.    Presented to [facility] on 02/14/2023 for confusion and worsening shortness of breath and hypersomnolence.  At the time of admission is requiring 2 L nasal cannula and he had transaminitis.  Chest x-ray suggested pneumonia and a procalcitonin over 150 with a fever of 102.  Started on broad antibiotics and transferred to [facility].  Working diagnosis was peritonitis from his peritoneal dialysis catheter based on cell count 2200 and hazy fluid in the peritoneal dialysate  Subsequent course - 02/16/2023 felt to be responding to antibiotics more awake.  Nursing noticed him to be ambulatory to the bathroom.  Home Xanax and Neurontin were being held.  For his symptomatic transaminitis he had right upper quadrant ultrasound that suggested cirrhosis.  He did have peritoneal dialysis on 02/16/2023  -02/17/2023: Malfunction of peritoneal dialysis catheter with complaints of abdominal pain.  Has subsequent status post temporary HD catheter right internal jugular vein.  He then underwent emergent dialysis and returned back to the unit at around 10 PM 02/17/2023.  Nursing noticed marked difference.  He was less interactive.  He was alert and having a gaze but not following commands.  He would mumble a word.  His temperature was 101.5.  Tachypneic having tremors oxygen needs now at 5 L [.  Hemodialysis was at 4 L] and  saturating 100% status post -1.2 L on hemodialysis.  Chest x-ray done showed worsening pulm infiltrates particularly lower lobes compared to admission.  Critical care medicine consulted.  By hospitalist.   Significant Hospital Events:  -02/14/2023 - admit -02/16/2023 felt to be responding to antibiotics more awake.  Nursing noticed him to be ambulatory to the bathroom.  Home Xanax and Neurontin were being held.  For his symptomatic transaminitis he had right upper quadrant ultrasound that suggested cirrhosis.  He did have peritoneal dialysis on 02/16/2023 -02/17/2023: Malfunction of peritoneal dialysis catheter with complaints of abdominal pain.  Has subsequent status post temporary HD catheter right internal jugular vein.  He then underwent emergent dialysis and returned back to the unit at around 10 PM 02/17/2023.  Nursing noticed marked difference.  He was less interactive.  He was alert and having a gaze but not following commands.  He would mumble a word.  His temperature was 101.5.  Tachypneic having tremors oxygen needs now at 5 L [.  Hemodialysis was at 4 L] and saturating 100% status post -1.2 L on hemodialysis.  Chest x-ray done showed worsening pulm infiltrates particularly lower lobes compared to admission.  Critical care medicine consulted.   -02/18/23: early hours - CCM consult -8/5 blood cultures >> Candida glabrata, staph species -8/5 PD catheter removed by vascular surgery -8/6 micafungin.  Ceftriaxone changed to Unasyn based on culture data -8/7 back to ICU w encephalopathy -8/8 line holiday. unable to cannulate AVF for HD. -8/9 temp HD line placed.    Interim History / Subjective:  Stool output  has improved, it was 145 cc in last 24 hours Still intermittently gets agitated, restless and started yelling Received 2 sessions of hemodialysis in 2 days  Objective   Blood pressure 96/65, pulse 72, temperature 98.5 F (36.9 C), temperature source Oral, resp. rate (!) 21, height 5\' 6"  (1.676  m), weight 95 kg, SpO2 97%.    FiO2 (%):  [100 %] 100 %   Intake/Output Summary (Last 24 hours) at 02/25/2023 1240 Last data filed at 02/25/2023 5638 Gross per 24 hour  Intake 460.91 ml  Output 2739.64 ml  Net -2278.73 ml   Filed Weights   02/23/23 0433 02/24/23 0332 02/25/23 0108  Weight: 97.5 kg 96.5 kg 95 kg    Examination: Physical exam: General: Elderly obese male, lying on the bed HEENT: [place]/AT, eyes anicteric.  moist mucus membranes Neuro:, Confused, restless and agitated Chest: Coarse breath sounds, no wheezes or rhonchi Heart: Regular rate and rhythm, no murmurs or gallops Abdomen: Soft, nontender, nondistended, bowel sounds present Skin: No rash  Labs and imaging reviewed Na 135 K 3.3 Cr 3.48 BUN 12 Phos 2.5 Mg 1.8  Alb<1.5  Resolved Hospital Problem list   Tremor  Assessment & Plan:  Acute metabolic/septic encephalopathy In the setting of uremia and peritonitis Infection is being treated Patient continued to remain encephalopathic despite infection is being treated appropriately and received dialysis twice He required Precedex infusion overnight, currently off Started on Haldol 2 mg IV every 6 hours as needed His home Xanax and gabapentin remain on hold (probable outside of window for withdrawal so wil   Sepsis due to peritonitis, Candida glabrata and multi organism bacteremia (Enterococcus, staph hemolyticus), POA Continue micafungin, and Unasyn Suspected source PD PD catheter was removed Appreciate ID follow-up TTE reviewed, negative for vegetation, will need TEE probably next week Ophthalmology eval negative for endophthalmitis  Acute hypoxemic respiratory failure, currently on BiPAP  Acute pulmonary edema due to volume overload from ESRD, improved Suspected OHS/OSA Refused BiPAP overnight On nasal cannula oxygen  End-stage renal disease on peritoneal dialysis in .  PD catheter out. Nephrology is following Received hemodialysis  overnight Trend BMP and electrolytes Increase midodrine to 10 mg 3 times daily  Chronic biventricular HFrEF Echo showed EF 45% with hypokinesis of RV Monitor intake and output GDMT as tolerated  Shock liver Monitor LFTs  Protein calorie malnutrition Continue dietary supplements  Hypervolemic hyponatremia Hypokalemia/hypophosphatemia Closely monitor electrolytes Aggressively supplement electrolytes  Anemia and thrombocytopenia of critical illness Monitor H&H and platelet count Watch for signs of bleeding Platelet count improved to 157  Best practice (daily eval):  Diet: Regular diet, no fluid restrictions DVT prophylaxis: heparin sq GI prophylaxis:  PPI Code Status: full code Family Communication: 8/11: Patient wife was updated at baseline    The patient is critically ill due to acute encephalopathy, sepsis due to peritonitis.  Critical care was necessary to treat or prevent imminent or life-threatening deterioration.  Critical care was time spent personally by me on the following activities: development of treatment plan with patient and/or surrogate as well as nursing, discussions with consultants, evaluation of patient's response to treatment, examination of patient, obtaining history from patient or surrogate, ordering and performing treatments and interventions, ordering and review of laboratory studies, ordering and review of radiographic studies, pulse oximetry, re-evaluation of patient's condition and participation in multidisciplinary rounds.   During this encounter critical care time was devoted to patient care services described in this note for 32 minutes.   Cheri Fowler, MD [place]  Critical Care See Amion for pager If no response to pager, please call [phone] until 7pm After 7pm, Please call E-link [phone]

## 2023-02-25 NOTE — Progress Notes (Signed)
   02/25/23 0545  Vitals  BP (!) 92/52  MAP (mmHg) 65  BP Location Right Leg  BP Method Automatic  Patient Position (if appropriate) Lying  ECG Heart Rate 64  Resp 19  Oxygen Therapy  O2 Device Nasal Cannula  Patient Activity (if Appropriate) In bed  Pulse Oximetry Type Continuous  During Treatment Monitoring  Blood Flow Rate (mL/min) 0 mL/min  Arterial Pressure (mmHg) -0.8 mmHg  Venous Pressure (mmHg) -0.4 mmHg  TMP (mmHg) -50.5 mmHg  Ultrafiltration Rate (mL/min) 1061 mL/min  Dialysate Flow Rate (mL/min) 300 ml/min  Dialysate Potassium Concentration 3  Dialysate Calcium Concentration 2.5  Post Treatment  Dialyzer Clearance Lightly streaked  Duration of HD Treatment -hour(s) 3.25 hour(s)  Hemodialysis Intake (mL) 0 mL  Liters Processed 77.8  Fluid Removed (mL) 3000.2 mL  Tolerated HD Treatment Yes  Post-Hemodialysis Comments HDS tx completed, uf oal met  AVG/AVF Arterial Site Held (minutes) 0 minutes  AVG/AVF Venous Site Held (minutes) 0 minutes  Note  Patient Observations pt is in bed resting  Hemodialysis Catheter Right Internal jugular Triple lumen Temporary (Non-Tunneled)  Placement Date/Time: 02/23/23 1100   Maximum sterile barrier precautions: Hand hygiene;Cap;Mask;Sterile gown;Sterile gloves;Large sterile sheet;Sterile probe cover  Site Prep: Chlorhexidine (preferred)  Local Anesthetic: Injectable - 1% Lidocaine  Ult...  Site Condition No complications  Blue Lumen Status Flushed;Heparin locked;Dead end cap in place  Red Lumen Status Flushed;Heparin locked;Dead end cap in place  Purple Lumen Status N/A  Catheter fill solution Heparin 1000 units/ml  Catheter fill volume (Arterial) 1.2 cc  Catheter fill volume (Venous) 1.2  Dressing Type Transparent  Dressing Status Antimicrobial disc in place  Drainage Description None  Post treatment catheter status Capped and Clamped

## 2023-02-25 NOTE — Progress Notes (Signed)
Contacted Dr. Arlean Hopping regarding the patients low blood pressure who is preparing for dialysis and may require the administration of a pressor. Per [place], will give 10 mg Midodrine PO now and have Levophed on standby. Orders placed and the Midodrine order modified to start now. Continuing to follow patients care plan.

## 2023-02-25 NOTE — Progress Notes (Signed)
Upon RT assessment, pt has normal work of breathing and oxygen of 100% on 5L salter. Pt refused BIPAP but will attempt again later or if pt has change in work of breathing or oxygenation.

## 2023-02-25 NOTE — Plan of Care (Signed)
Progressing care plan

## 2023-02-25 NOTE — Progress Notes (Signed)
Dr. Arlean Hopping made aware of morning labs (K=3.3, Ca=7.3, & Mag=1.8). Patient tolerated dialysis well, 3L off. Precedex restarted for agitation. Patient lying comfortably in bed, VSS.

## 2023-02-25 NOTE — Progress Notes (Signed)
Subjective: had HD overnight w/ 3 L off. 5kg under dry wt now.   Objective Vital signs in last 24 hours: Vitals:   02/25/23 0800 02/25/23 0835 02/25/23 0900 02/25/23 1000  BP: 103/60  (!) 97/59 (!) 86/67  Pulse:   71 80  Resp: 20  (!) 21 (!) 26  Temp:      TempSrc:      SpO2: 95% 97% 100% 90%  Weight:      Height:       Weight change: -1.5 kg  Physical Exam: General: more alert today Heart: RRR no MRG Lungs: no coughing, no rhonchi, no rales Abdomen: soft, obese, PD cath has been removed Extremities: No pedal edema LUA AVF +positive thrill  temp HD cath in place Neuro: as above   Home med --> albuterol, xanax, aspirin, aotrvastatin, clopidogrel, esomeprazole, gabapentin, insulin glargine, linagliptin, midodrine   Outpatient PD orders:  [facility] (note his PD RN was on vacation  but nephrology called [facility] for orders  [facility] - [facility] [place] [facility] - 100kg 5 cycles, 3000 fill vol (note pt states on consult he thinks is 2.5 liters), total time: 10 hrs, last fill 1500 cc Mircera 150 mcg q monthly Venofer 100mg  once a month L AVF +thrill but small + tortuous    CXR 8/05 - IMPRESSION: Persistent bibasilar opacities, which may represent atelectasis or pneumonia.  Assessment/ Plan: Peritonitis PD catheter related peritonitis - initial cell count 2200, 93% PMNs. Pt initially rec'd IV cefepime, vanc and flagyl. PD fluid cx was negative and initial BCx's done 8/01 were negative.  Repeat blood cx's however done 8/04 were + for GPC and fungus (BCID showing Staph and C. Glabrata). ID consulting and abx changed to IV rocephin and micafungin. PD cath removed 8/05 due to fungemia. Per ccm / ID.  Vol overload/ pulm edema - lowering dry wt, down to 5kg under today after HD Fri and Sat. Get CXR today. Still requiring ^O2 -> plan HD again tomorrow.  ESRD - pt rec'd PD initially, then PD cath pulled due to infection. Had HD 3x this past week, last  one on Sat. HD tomorrow as above.  HD access - PD not working 8/2 --> IR placed temp HD catheter and underwent HD 8/3.  PD cath removed 8/05 due to fungal sepsis. Had iHD on 8/06 via temp cath then it was dc'd for line holiday. L arm AVF was not usable when we tried on 8/08. Temp cath replaced 8/09 by CCM, line holiday completed. Will need tunneled cath sometime next week w/ ID's approval.  Hypotension - chronic issue on home midodrine 5 tid. Here ^'d to 10mg  tid + 10mg  pre HD. Continue until we can get him to a new dry wt then can back off.  Anemia esrd - Hb 8's, will start darbe 60 mcg weekly while here.  Secondary hyperparathyroidism -corrected calcium 10, no vitamin D, phosphorus 8.2, Renvela binder   Rob   MD  [facility] 02/25/2023, 11:29 AM  Recent Labs  Lab 02/24/23 0333 02/24/23 1326 02/25/23 0537  HGB 8.3* 9.2* 9.0*  ALBUMIN <1.5*  --  <1.5*  CALCIUM 7.7*  --  7.5*  PHOS 3.4  --  2.5  CREATININE 6.82*  --  3.48*  K 3.5 3.8 3.3*    Inpatient medications:  aspirin EC  81 mg Oral Daily   Chlorhexidine Gluconate Cloth  6 each Topical Daily   clopidogrel  75 mg Oral Daily  darbepoetin (ARANESP) injection - DIALYSIS  60 mcg Subcutaneous Q Fri-1800   feeding supplement  237 mL Oral BID BM   Gerhardt's butt cream   Topical QID   heparin  5,000 Units Subcutaneous Q8H   insulin aspart  0-5 Units Subcutaneous QHS   insulin aspart  0-6 Units Subcutaneous TID WC   ipratropium-albuterol  3 mL Nebulization BID   midodrine  10 mg Oral Q T,Th,Sa-HD   midodrine  10 mg Oral TID WC   mometasone-formoterol  2 puff Inhalation BID   pantoprazole  40 mg Oral BID   sevelamer carbonate  2,400 mg Oral TID WC   sodium chloride flush  3 mL Intravenous Q12H    sodium chloride Stopped (02/22/23 1238)   ampicillin-sulbactam (UNASYN) IV 3 g (02/25/23 1114)   dexmedetomidine (PRECEDEX) IV infusion Stopped (02/25/23 0815)   magnesium sulfate bolus IVPB     micafungin (MYCAMINE) 200 mg in sodium  chloride 0.9 % 100 mL IVPB Stopped (02/25/23 0206)   norepinephrine (LEVOPHED) Adult infusion Stopped (02/25/23 0338)   potassium chloride 10 mEq (02/25/23 1118)   acetaminophen, albuterol, haloperidol lactate, heparin, heparin, heparin sodium (porcine), heparin sodium (porcine), HYDROmorphone (DILAUDID) injection, loperamide, ondansetron (ZOFRAN) IV, mouth rinse         Labs: Basic Metabolic Panel: Recent Labs  Lab 02/23/23 0352 02/24/23 0333 02/24/23 1326 02/25/23 0537  NA 137 133* 135 135  K 3.9 3.5 3.8 3.3*  CL 95* 95*  --  97*  CO2 24 26  --  26  GLUCOSE 184* 147*  --  126*  BUN 61* 26*  --  12  CREATININE 12.36* 6.82*  --  3.48*  CALCIUM 7.9* 7.7*  --  7.5*  PHOS 6.2* 3.4  --  2.5   Liver Function Tests: Recent Labs  Lab 02/19/23 0458 02/21/23 0407 02/23/23 0352 02/24/23 0333 02/25/23 0537  AST 248* 75*  --   --   --   ALT 359* 178*  --   --   --   ALKPHOS 99 87  --   --   --   BILITOT 1.2 1.2  --   --   --   PROT 5.0* 5.1*  --   --   --   ALBUMIN <1.5* <1.5* <1.5* <1.5* <1.5*   No results for input(s): "LIPASE", "AMYLASE" in the last 168 hours.  No results for input(s): "AMMONIA" in the last 168 hours.  CBC: Recent Labs  Lab 02/19/23 0458 02/20/23 0126 02/21/23 0407 02/22/23 0023 02/23/23 0352 02/24/23 0333 02/24/23 1326 02/25/23 0537  WBC 5.0   < > 8.7 11.9* 9.4 10.2  --  9.5  NEUTROABS 4.7  --   --   --   --   --   --   --   HGB 9.4*   < > 8.6* 8.9* 8.6* 8.3* 9.2* 9.0*  HCT 28.9*   < > 26.3* 27.9* 26.8* 26.2* 27.0* 28.3*  MCV 83.8   < > 84.0 85.1 87.3 85.3  --  87.1  PLT 104*   < > 127* 133* 106* 126*  --  157   < > = values in this interval not displayed.   Cardiac Enzymes: No results for input(s): "CKTOTAL", "CKMB", "CKMBINDEX", "TROPONINI" in the last 168 hours.  CBG: Recent Labs  Lab 02/24/23 0728 02/24/23 1122 02/24/23 1703 02/24/23 2056 02/25/23 0749  GLUCAP 133* 126* 144* 132* 121*    Studies/Results: No results  found. Medications:  sodium chloride Stopped (02/22/23  1238)   ampicillin-sulbactam (UNASYN) IV 3 g (02/25/23 1114)   dexmedetomidine (PRECEDEX) IV infusion Stopped (02/25/23 0815)   magnesium sulfate bolus IVPB     micafungin (MYCAMINE) 200 mg in sodium chloride 0.9 % 100 mL IVPB Stopped (02/25/23 0206)   norepinephrine (LEVOPHED) Adult infusion Stopped (02/25/23 0338)   potassium chloride 10 mEq (02/25/23 1118)    aspirin EC  81 mg Oral Daily   Chlorhexidine Gluconate Cloth  6 each Topical Daily   clopidogrel  75 mg Oral Daily   darbepoetin (ARANESP) injection - DIALYSIS  60 mcg Subcutaneous Q Fri-1800   feeding supplement  237 mL Oral BID BM   Gerhardt's butt cream   Topical QID   heparin  5,000 Units Subcutaneous Q8H   insulin aspart  0-5 Units Subcutaneous QHS   insulin aspart  0-6 Units Subcutaneous TID WC   ipratropium-albuterol  3 mL Nebulization BID   midodrine  10 mg Oral Q T,Th,Sa-HD   midodrine  10 mg Oral TID WC   mometasone-formoterol  2 puff Inhalation BID   pantoprazole  40 mg Oral BID   sevelamer carbonate  2,400 mg Oral TID WC   sodium chloride flush  3 mL Intravenous Q12H

## 2023-02-26 ENCOUNTER — Other Ambulatory Visit: Payer: Self-pay

## 2023-02-26 ENCOUNTER — Encounter (HOSPITAL_COMMUNITY): Payer: Medicare Other

## 2023-02-26 DIAGNOSIS — Z992 Dependence on renal dialysis: Secondary | ICD-10-CM | POA: Diagnosis not present

## 2023-02-26 DIAGNOSIS — N186 End stage renal disease: Secondary | ICD-10-CM | POA: Diagnosis not present

## 2023-02-26 DIAGNOSIS — G9340 Encephalopathy, unspecified: Secondary | ICD-10-CM | POA: Diagnosis not present

## 2023-02-26 LAB — GLUCOSE, CAPILLARY
Glucose-Capillary: 143 mg/dL — ABNORMAL HIGH (ref 70–99)
Glucose-Capillary: 152 mg/dL — ABNORMAL HIGH (ref 70–99)
Glucose-Capillary: 129 mg/dL — ABNORMAL HIGH (ref 70–99)
Glucose-Capillary: 155 mg/dL — ABNORMAL HIGH (ref 70–99)

## 2023-02-26 MED ORDER — QUETIAPINE FUMARATE 50 MG PO TABS
50.0000 mg | ORAL_TABLET | Freq: Once | ORAL | Status: AC
  Administered 2023-02-27: 50 mg via ORAL
  Filled 2023-02-26: qty 1

## 2023-02-26 MED ORDER — ACETAMINOPHEN 325 MG PO TABS
650.0000 mg | ORAL_TABLET | Freq: Four times a day (QID) | ORAL | Status: DC | PRN
  Administered 2023-03-01 (×2): 650 mg via ORAL
  Filled 2023-02-26 (×2): qty 2

## 2023-02-26 MED ORDER — PANTOPRAZOLE SODIUM 40 MG PO TBEC
40.0000 mg | DELAYED_RELEASE_TABLET | Freq: Two times a day (BID) | ORAL | Status: DC
  Administered 2023-02-27 – 2023-03-02 (×7): 40 mg via ORAL
  Filled 2023-02-26 (×7): qty 1

## 2023-02-26 MED ORDER — PANTOPRAZOLE SODIUM 40 MG IV SOLR
40.0000 mg | Freq: Once | INTRAVENOUS | Status: AC
  Administered 2023-02-26: 40 mg via INTRAVENOUS
  Filled 2023-02-26: qty 10

## 2023-02-26 NOTE — Progress Notes (Signed)
eLink Physician-Brief Progress Note Patient Name: [name] [name] DOB: Jan 24, 1951 MRN: [id number]   Date of Service  02/26/2023  HPI/Events of Note  Bcx 02/18/23   CANDIDA GLABRATA Sent to [facility] for further susceptibility testing. ENTEROCOCCUS AVIUM STAPHYLOCOCCUS HAEMOLYTICUS   Query regarding contact precautions for Enteroccus Avium  eICU Interventions  Recommend RN to contact Infection Prevention in am For now, contact precautions      Intervention Category Minor Interventions: Communication with other healthcare providers and/or family   Mechele Collin 02/26/2023, 12:09 AM

## 2023-02-26 NOTE — Plan of Care (Signed)
  Problem: Education: Goal: Knowledge of disease or condition will improve Outcome: Not Progressing Goal: Knowledge of the prescribed therapeutic regimen will improve Outcome: Not Progressing Goal: Individualized Educational Video(s) Outcome: Not Progressing   Problem: Activity: Goal: Ability to tolerate increased activity will improve Outcome: Not Progressing   Problem: Respiratory: Goal: Ability to maintain a clear airway will improve Outcome: Progressing

## 2023-02-26 NOTE — Progress Notes (Signed)
Subjective: Somewhat agitated this morning after rectal tube removed.  States he is breathing well.  No other complaints  Objective Vital signs in last 24 hours: Vitals:   02/26/23 0330 02/26/23 0334 02/26/23 0400 02/26/23 0839  BP: (!) 76/57  (!) 110/59   Pulse: 62  65   Resp: 14  14   Temp:    98.8 F (37.1 C)  TempSrc:    Axillary  SpO2: 100%  97%   Weight:  95 kg    Height:       Weight change: 0 kg  Physical Exam: General: Awake and alert Heart: Normal rate, no rub Lungs: Chest rise no increased work of breathing Abdomen: soft, obese, PD cath has been removed Extremities: No pedal edema, warm and well-perfused LUA AVF +positive thrill  temp HD cath in place Neuro: as above   Home med --> albuterol, xanax, aspirin, aotrvastatin, clopidogrel, esomeprazole, gabapentin, insulin glargine, linagliptin, midodrine   Outpatient PD orders:  [facility]ed [facility] (note his PD RN was on vacation  but nephrology called [facility] [place] [facility] for orders  [facility] - [facility] [place] [facility] - 100kg 5 cycles, 3000 fill vol (note pt states on consult he thinks is 2.5 liters), total time: 10 hrs, last fill 1500 cc Mircera 150 mcg q monthly Venofer 100mg  once a month L AVF +thrill but small + tortuous    CXR 8/05 - IMPRESSION: Persistent bibasilar opacities, which may represent atelectasis or pneumonia.  Assessment/ Plan: Peritonitis PD catheter related peritonitis -also with blood cultures positive for fungus.  Followed by ID on antibiotics as well as micafungin.  PD catheter removed on 8/5.  Per ccm / ID.  Vol overload/ pulm edema -had diet assist on Fri and Sat. Get CXR today.  Ongoing fluid removal with dialysis as tolerated.  Plan for dialysis today ESRD - pt rec'd PD initially, then PD cath pulled due to infection. Had HD 3x this past week, last one on Sat.  Likely meant an MWF schedule at this time HD access - PD not working 8/2 --> IR placed temp HD catheter and  underwent HD 8/3.  PD cath removed 8/05 due to fungal sepsis. Had iHD on 8/06 via temp cath then it was dc'd for line holiday. L arm AVF was not usable when we tried on 8/08 (infiltrated, tortuous, too deep?). Temp cath replaced 8/09 by CCM, line holiday completed and cultures were clear on 8/5. Discussed with ID today who is okay with permanent catheter placement. I will consult [facility] so they can also look at his AVF and see if it can be made viable. Hypotension - chronic issue on home midodrine 5 tid. Here ^'d to 10mg  tid + 10mg  pre HD. Continue until we can get him to a new dry wt then can back off.  Anemia esrd - Hb 9's, will start darbe 60 mcg weekly while here.  Secondary hyperparathyroidism -corrected calcium normal, phos low, stop binder.  Darnell Level  02/26/2023, 10:36 AM  Recent Labs  Lab 02/24/23 0333 02/24/23 1326 02/25/23 0537  HGB 8.3* 9.2* 9.0*  ALBUMIN <1.5*  --  <1.5*  CALCIUM 7.7*  --  7.5*  PHOS 3.4  --  2.5  CREATININE 6.82*  --  3.48*  K 3.5 3.8 3.3*    Inpatient medications:  aspirin EC  81 mg Oral Daily   Chlorhexidine Gluconate Cloth  6 each Topical Daily   ciprofloxacin  500 mg Oral q1800   clopidogrel  75 mg Oral Daily   darbepoetin (ARANESP) injection - DIALYSIS  60 mcg Subcutaneous Q Fri-1800   feeding supplement  237 mL Oral BID BM   Gerhardt's butt cream   Topical QID   heparin  5,000 Units Subcutaneous Q8H   insulin aspart  0-5 Units Subcutaneous QHS   insulin aspart  0-6 Units Subcutaneous TID WC   midodrine  10 mg Oral TID WC   mometasone-formoterol  2 puff Inhalation BID   pantoprazole  40 mg Oral BID   sevelamer carbonate  2,400 mg Oral TID WC   sodium chloride flush  3 mL Intravenous Q12H    sodium chloride Stopped (02/22/23 1238)   ampicillin-sulbactam (UNASYN) IV Stopped (02/25/23 2228)   micafungin (MYCAMINE) 200 mg in sodium chloride 0.9 % 100 mL IVPB Stopped (02/26/23 0232)   acetaminophen, acetaminophen, albuterol, haloperidol  lactate, heparin, heparin, heparin sodium (porcine), heparin sodium (porcine), loperamide, midodrine, ondansetron (ZOFRAN) IV, mouth rinse         Labs: Basic Metabolic Panel: Recent Labs  Lab 02/23/23 0352 02/24/23 0333 02/24/23 1326 02/25/23 0537  NA 137 133* 135 135  K 3.9 3.5 3.8 3.3*  CL 95* 95*  --  97*  CO2 24 26  --  26  GLUCOSE 184* 147*  --  126*  BUN 61* 26*  --  12  CREATININE 12.36* 6.82*  --  3.48*  CALCIUM 7.9* 7.7*  --  7.5*  PHOS 6.2* 3.4  --  2.5   Liver Function Tests: Recent Labs  Lab 02/21/23 0407 02/23/23 0352 02/24/23 0333 02/25/23 0537  AST 75*  --   --   --   ALT 178*  --   --   --   ALKPHOS 87  --   --   --   BILITOT 1.2  --   --   --   PROT 5.1*  --   --   --   ALBUMIN <1.5* <1.5* <1.5* <1.5*   No results for input(s): "LIPASE", "AMYLASE" in the last 168 hours.  No results for input(s): "AMMONIA" in the last 168 hours.  CBC: Recent Labs  Lab 02/21/23 0407 02/22/23 0023 02/23/23 0352 02/24/23 0333 02/24/23 1326 02/25/23 0537  WBC 8.7 11.9* 9.4 10.2  --  9.5  HGB 8.6* 8.9* 8.6* 8.3* 9.2* 9.0*  HCT 26.3* 27.9* 26.8* 26.2* 27.0* 28.3*  MCV 84.0 85.1 87.3 85.3  --  87.1  PLT 127* 133* 106* 126*  --  157   Cardiac Enzymes: No results for input(s): "CKTOTAL", "CKMB", "CKMBINDEX", "TROPONINI" in the last 168 hours.  CBG: Recent Labs  Lab 02/25/23 0749 02/25/23 1141 02/25/23 1759 02/25/23 2206 02/26/23 0824  GLUCAP 121* 139* 143* 162* 143*    Studies/Results: No results found. Medications:  sodium chloride Stopped (02/22/23 1238)   ampicillin-sulbactam (UNASYN) IV Stopped (02/25/23 2228)   micafungin (MYCAMINE) 200 mg in sodium chloride 0.9 % 100 mL IVPB Stopped (02/26/23 0232)    aspirin EC  81 mg Oral Daily   Chlorhexidine Gluconate Cloth  6 each Topical Daily   ciprofloxacin  500 mg Oral q1800   clopidogrel  75 mg Oral Daily   darbepoetin (ARANESP) injection - DIALYSIS  60 mcg Subcutaneous Q Fri-1800   feeding  supplement  237 mL Oral BID BM   Gerhardt's butt cream   Topical QID   heparin  5,000 Units Subcutaneous Q8H   insulin aspart  0-5 Units Subcutaneous QHS   insulin aspart  0-6 Units Subcutaneous  TID WC   midodrine  10 mg Oral TID WC   mometasone-formoterol  2 puff Inhalation BID   pantoprazole  40 mg Oral BID   sevelamer carbonate  2,400 mg Oral TID WC   sodium chloride flush  3 mL Intravenous Q12H

## 2023-02-26 NOTE — Progress Notes (Addendum)
  Progress Note    02/26/2023 11:35 AM 7 Days Post-Op  Subjective: Patient currently agitated but does not have any abdominal or upper extremity complaints  Vitals:   02/26/23 0400 02/26/23 0839  BP: (!) 110/59   Pulse: 65   Resp: 14   Temp:  98.8 F (37.1 C)  SpO2: 97%     Physical Exam: Agitated but easy to redirect Right neck central venous line in place Abdomen is soft and nontender Left upper arm fistula with pulsatility  CBC    Component Value Date/Time   WBC 9.5 02/25/2023 0537   RBC 3.25 (L) 02/25/2023 0537   HGB 9.0 (L) 02/25/2023 0537   HGB 11.3 (L) 12/22/2021 1017   HCT 28.3 (L) 02/25/2023 0537   HCT 33.7 (L) 12/22/2021 1017   PLT 157 02/25/2023 0537   PLT 117 (L) 12/22/2021 1017   MCV 87.1 02/25/2023 0537   MCV 91 12/22/2021 1017   MCH 27.7 02/25/2023 0537   MCHC 31.8 02/25/2023 0537   RDW 19.5 (H) 02/25/2023 0537   RDW 13.7 12/22/2021 1017   LYMPHSABS 0.1 (L) 02/19/2023 0458   MONOABS 0.1 02/19/2023 0458   EOSABS 0.0 02/19/2023 0458   BASOSABS 0.0 02/19/2023 0458    BMET    Component Value Date/Time   NA 135 02/25/2023 0537   NA 136 12/22/2021 1017   K 3.3 (L) 02/25/2023 0537   CL 97 (L) 02/25/2023 0537   CO2 26 02/25/2023 0537   GLUCOSE 126 (H) 02/25/2023 0537   BUN 12 02/25/2023 0537   BUN 58 (H) 12/22/2021 1017   CREATININE 3.48 (H) 02/25/2023 0537   CREATININE 18.89 (H) 02/09/2020 1126   CALCIUM 7.5 (L) 02/25/2023 0537   GFRNONAA 18 (L) 02/25/2023 0537   GFRNONAA 2 (L) 02/09/2020 1126   GFRAA <4 (L) 02/09/2020 1126    INR    Component Value Date/Time   INR 1.2 02/18/2023 0425     Intake/Output Summary (Last 24 hours) at 02/26/2023 1135 Last data filed at 02/26/2023 0300 Gross per 24 hour  Intake 546.35 ml  Output --  Net 546.35 ml     Assessment/plan:  72 y.o. male is here with PD catheter related peritonitis with known end-stage renal disease previously on dialysis via PD catheter which has now been removed.  He also  has a fistula that has been present in the left upper arm for many years but was unable to be used and is pulsatile on exam.  Plan will be for fistulogram and tunneled dialysis catheter when scheduling allows likely this coming Wednesday in the cath lab with Dr. Karin Lieu.      C. Randie Heinz, MD [facility] Office: [phone] Pager: [phone]  02/26/2023 11:35 AM

## 2023-02-26 NOTE — Progress Notes (Signed)
Updated clinicals faxed to [facility] today per clinic request. Will assist as needed.   Olivia Canter Renal Navigator [phone]

## 2023-02-26 NOTE — Plan of Care (Signed)
  Problem: Education: Goal: Knowledge of disease or condition will improve Outcome: Progressing Goal: Knowledge of the prescribed therapeutic regimen will improve Outcome: Progressing Goal: Individualized Educational Video(s) Outcome: Progressing   Problem: Activity: Goal: Ability to tolerate increased activity will improve Outcome: Progressing   Problem: Respiratory: Goal: Ability to maintain a clear airway will improve Outcome: Progressing Goal: Levels of oxygenation will improve Outcome: Progressing Goal: Ability to maintain adequate ventilation will improve Outcome: Progressing   Problem: Fluid Volume: Goal: Ability to maintain a balanced intake and output will improve Outcome: Progressing   Problem: Metabolic: Goal: Ability to maintain appropriate glucose levels will improve Outcome: Progressing   Problem: Nutritional: Goal: Maintenance of adequate nutrition will improve Outcome: Progressing Goal: Progress toward achieving an optimal weight will improve Outcome: Progressing   Problem: Skin Integrity: Goal: Risk for impaired skin integrity will decrease Outcome: Progressing   Problem: Tissue Perfusion: Goal: Adequacy of tissue perfusion will improve Outcome: Progressing   Problem: Education: Goal: Knowledge of General Education information will improve Description: Including pain rating scale, medication(s)/side effects and non-pharmacologic comfort measures Outcome: Progressing   Problem: Health Behavior/Discharge Planning: Goal: Ability to manage health-related needs will improve Outcome: Progressing   Problem: Clinical Measurements: Goal: Ability to maintain clinical measurements within normal limits will improve Outcome: Progressing Goal: Will remain free from infection Outcome: Progressing Goal: Diagnostic test results will improve Outcome: Progressing Goal: Respiratory complications will improve Outcome: Progressing Goal: Cardiovascular  complication will be avoided Outcome: Progressing   Problem: Activity: Goal: Risk for activity intolerance will decrease Outcome: Progressing   Problem: Nutrition: Goal: Adequate nutrition will be maintained Outcome: Progressing   Problem: Coping: Goal: Level of anxiety will decrease Outcome: Progressing   Problem: Elimination: Goal: Will not experience complications related to bowel motility Outcome: Progressing Goal: Will not experience complications related to urinary retention Outcome: Progressing   Problem: Pain Managment: Goal: General experience of comfort will improve Outcome: Progressing   Problem: Safety: Goal: Ability to remain free from injury will improve Outcome: Progressing   Problem: Skin Integrity: Goal: Risk for impaired skin integrity will decrease Outcome: Progressing   Problem: Safety: Goal: Non-violent Restraint(s) Outcome: Progressing

## 2023-02-26 NOTE — Progress Notes (Signed)
Physical Therapy Treatment Patient Details Name: [name] [name] MRN: [id number] DOB: 1950/09/07 Today's Date: 02/26/2023   History of Present Illness 72 y/o male admitted 7/31 for confusion and progressive SOB, pt with PNA and transaminitis.8/3 PD catheter malfunction. 8/5 Peritoneal dialysis catheter removed. PMHx:  CABG, COPD, IDT2DM, ESRD on PD, CAD, Rt TKA, HTN, gout    PT Comments  Pt greeted seated up EOB with RN present on arrival and agreeable to session with steady progress towards acute goals. Pt performing transfers to sit<>stand and step pivot to chair with CGA for safety and RW for support. Pt able to progress gait for short in room distance this session, limited as pt with difficulty following navigational directions needing max cues for safety and RW management, distance limited also by pt stated fatigue and need for rest seated break. Current plan remains appropriate to address deficits and maximize functional independence and decrease caregiver burden. Pt continues to benefit from skilled PT services to progress toward functional mobility goals.      If plan is discharge home, recommend the following: A lot of help with walking and/or transfers;A lot of help with bathing/dressing/bathroom;Assistance with cooking/housework;Assistance with feeding;Direct supervision/assist for financial management;Direct supervision/assist for medications management   Can travel by private vehicle     No  Equipment Recommendations  BSC/3in1;Wheelchair (measurements PT)    Recommendations for Other Services       Precautions / Restrictions Precautions Precautions: Fall;Other (comment) Precaution Comments: watch sats Restrictions Weight Bearing Restrictions: No     Mobility  Bed Mobility Overal bed mobility: Needs Assistance             General bed mobility comments: pt up on EOB with RN on arrival    Transfers Overall transfer level: Needs assistance Equipment used: Rolling  walker (2 wheels) Transfers: Sit to/from Stand, Bed to chair/wheelchair/BSC Sit to Stand: Contact guard assist           General transfer comment: cues for hand placement; slow, steady rise.    Ambulation/Gait Ambulation/Gait assistance: Min assist Gait Distance (Feet): 10 Feet Assistive device: Rolling walker (2 wheels) Gait Pattern/deviations: Decreased step length - right, Decreased step length - left, Decreased stride length, Shuffle, Step-through pattern Gait velocity: decr     General Gait Details: short shuffling steps in room with cues for RW proximity as pt pushing RW out too far out in front of self, pt with difficulty following commands for navigation around bed needing max cues for safety however no overt LOB noted   Stairs             Wheelchair Mobility     Tilt Bed    Modified Rankin (Stroke Patients Only)       Balance Overall balance assessment: Needs assistance Sitting-balance support: No upper extremity supported, Feet supported Sitting balance-Leahy Scale: Fair     Standing balance support: Bilateral upper extremity supported, During functional activity, Reliant on assistive device for balance Standing balance-Leahy Scale: Poor Standing balance comment: RW in standing                            Cognition Arousal: Alert, Lethargic (waxing/waning) Behavior During Therapy: Flat affect Overall Cognitive Status: Impaired/Different from baseline Area of Impairment: Attention, Memory, Following commands, Safety/judgement, Awareness, Problem solving                   Current Attention Level: Focused, Sustained (sustained for short periods) Memory: Decreased  short-term memory Following Commands: Follows one step commands inconsistently, Follows one step commands with increased time Safety/Judgement: Decreased awareness of deficits, Decreased awareness of safety Awareness: Intellectual, Emergent (aware of fatigue and asking for  rest breaks.) Problem Solving: Slow processing, Requires verbal cues, Requires tactile cues, Decreased initiation General Comments: eager to be up OOB        Exercises Other Exercises Other Exercises: seated marching x10    General Comments General comments (skin integrity, edema, etc.): VSS on supplemental O2      Pertinent Vitals/Pain Pain Assessment Pain Assessment: No/denies pain    Home Living                          Prior Function            PT Goals (current goals can now be found in the care plan section) Acute Rehab PT Goals PT Goal Formulation: With patient Time For Goal Achievement: 02/23/23 Progress towards PT goals: Progressing toward goals    Frequency    Min 1X/week      PT Plan      Co-evaluation              AM-PAC PT "6 Clicks" Mobility   Outcome Measure  Help needed turning from your back to your side while in a flat bed without using bedrails?: A Lot Help needed moving from lying on your back to sitting on the side of a flat bed without using bedrails?: A Lot Help needed moving to and from a bed to a chair (including a wheelchair)?: A Little Help needed standing up from a chair using your arms (e.g., wheelchair or bedside chair)?: A Little Help needed to walk in hospital room?: A Lot Help needed climbing 3-5 steps with a railing? : Total 6 Click Score: 13    End of Session Equipment Utilized During Treatment: Gait belt;Oxygen Activity Tolerance: Patient tolerated treatment well Patient left: in chair;with call bell/phone within reach;with chair alarm set;with nursing/sitter in room Nurse Communication: Mobility status PT Visit Diagnosis: Muscle weakness (generalized) (M62.81);Unsteadiness on feet (R26.81)     Time: 1610-9604 PT Time Calculation (min) (ACUTE ONLY): 27 min  Charges:    $Therapeutic Activity: 23-37 mins PT General Charges $$ ACUTE PT VISIT: 1 Visit                      R. PTA Acute  Rehabilitation Services Office: [phone]   Catalina Antigua 02/26/2023, 10:54 AM

## 2023-02-26 NOTE — Progress Notes (Signed)
eLink Physician-Brief Progress Note Patient Name: [name] [name] DOB: Sep 15, 1950 MRN: [id number]   Date of Service  02/26/2023  HPI/Events of Note  Agitated, yelling out, intermittently able to re-orient however returns agitated  Qtc <500 mc  eICU Interventions  Add seroquel tonight. Defer to day team to continue PRN haldol in place     Intervention Category Minor Interventions: Agitation / anxiety - evaluation and management   Mechele Collin 02/26/2023, 10:06 PM

## 2023-02-26 NOTE — Progress Notes (Signed)
NAME:  [name] [name], MRN:  [id number], DOB:  1950/11/02, LOS: 11 ADMISSION DATE:  02/14/2023, CONSULTATION DATE:  02/18/23 REFERRING MD:  Reggie Pile of [facility] X-cver, CHIEF COMPLAINT:  SIRS, sepsis, encephalopathy resp distress    History of Present Illness:   72 year old BMI 35.5 obese male with a history of bypass in 2023, COPD not otherwise specified, type 2 diabetes mellitus, acid reflux, ESRD on peritoneal dialysis, hypotension on midodrine.  Sometime mid June 2024 had COVID-19 and treated with Augmentin.  Subsequent to that had cough and poor oral intake and lethargy.    Presented to [facility] on 02/14/2023 for confusion and worsening shortness of breath and hypersomnolence.  At the time of admission is requiring 2 L nasal cannula and he had transaminitis.  Chest x-ray suggested pneumonia and a procalcitonin over 150 with a fever of 102.  Started on broad antibiotics and transferred to [facility].  Working diagnosis was peritonitis from his peritoneal dialysis catheter based on cell count 2200 and hazy fluid in the peritoneal dialysate  Subsequent course - 02/16/2023 felt to be responding to antibiotics more awake.  Nursing noticed him to be ambulatory to the bathroom.  Home Xanax and Neurontin were being held.  For his symptomatic transaminitis he had right upper quadrant ultrasound that suggested cirrhosis.  He did have peritoneal dialysis on 02/16/2023  -02/17/2023: Malfunction of peritoneal dialysis catheter with complaints of abdominal pain.  Has subsequent status post temporary HD catheter right internal jugular vein.  He then underwent emergent dialysis and returned back to the unit at around 10 PM 02/17/2023.  Nursing noticed marked difference.  He was less interactive.  He was alert and having a gaze but not following commands.  He would mumble a word.  His temperature was 101.5.  Tachypneic having tremors oxygen needs now at 5 L [.  Hemodialysis was at 4 L] and  saturating 100% status post -1.2 L on hemodialysis.  Chest x-ray done showed worsening pulm infiltrates particularly lower lobes compared to admission.  Critical care medicine consulted.  By hospitalist.   Significant Hospital Events:  -02/14/2023 - admit -02/16/2023 felt to be responding to antibiotics more awake.  Nursing noticed him to be ambulatory to the bathroom.  Home Xanax and Neurontin were being held.  For his symptomatic transaminitis he had right upper quadrant ultrasound that suggested cirrhosis.  He did have peritoneal dialysis on 02/16/2023 -02/17/2023: Malfunction of peritoneal dialysis catheter with complaints of abdominal pain.  Has subsequent status post temporary HD catheter right internal jugular vein.  He then underwent emergent dialysis and returned back to the unit at around 10 PM 02/17/2023.  Nursing noticed marked difference.  He was less interactive.  He was alert and having a gaze but not following commands.  He would mumble a word.  His temperature was 101.5.  Tachypneic having tremors oxygen needs now at 5 L [.  Hemodialysis was at 4 L] and saturating 100% status post -1.2 L on hemodialysis.  Chest x-ray done showed worsening pulm infiltrates particularly lower lobes compared to admission.  Critical care medicine consulted.   -02/18/23: early hours - CCM consult -8/5 blood cultures >> Candida glabrata, staph species -8/5 PD catheter removed by vascular surgery -8/6 micafungin.  Ceftriaxone changed to Unasyn based on culture data -8/7 back to ICU w encephalopathy -8/8 line holiday. unable to cannulate AVF for HD. -8/9 temp HD line placed.    Interim History / Subjective:  Still intermittently  gets agitated, restless and started yelling, mental status is much better since admission Received 2 sessions of hemodialysis in 2 days No overnight issues Remain off Precedex for more than 24 hours  Objective   Blood pressure (!) 110/59, pulse 65, temperature 98.8 F (37.1 C),  temperature source Axillary, resp. rate 14, height 5\' 6"  (1.676 m), weight 95 kg, SpO2 97%.        Intake/Output Summary (Last 24 hours) at 02/26/2023 0842 Last data filed at 02/26/2023 0300 Gross per 24 hour  Intake 546.35 ml  Output --  Net 546.35 ml   Filed Weights   02/24/23 0332 02/25/23 0108 02/26/23 0334  Weight: 96.5 kg 95 kg 95 kg    Examination: Physical exam: General: Chronically ill-appearing obese male, lying on the bed HEENT: [place]/AT, eyes anicteric.  moist mucus membranes Neuro: Alert, awake following commands, gets agitated and restless intermittently Chest: Coarse breath sounds, no wheezes or rhonchi Heart: Regular rate and rhythm, no murmurs or gallops Abdomen: Soft, nontender, nondistended, bowel sounds present Skin: No rash  Labs and imaging reviewed Na 135 K 3.3 Cr 3.48 BUN 12 Phos 2.5 Mg 1.8  Alb<1.5  Resolved Hospital Problem list   Tremor  Assessment & Plan:  Acute metabolic/septic encephalopathy In the setting of uremia and peritonitis Infection is being treated Patient mental status is improving but intermittently he gets hyperactive agitated and restless Received 2 sessions of hemodialysis now Off Precedex infusion for more than 24-hour Continue Haldol 2 mg IV every 6 hours as needed His home Xanax and gabapentin remain on hold (probable outside of window for withdrawal so wil   Sepsis due to peritonitis, Candida glabrata and multi organism bacteremia (Enterococcus, staph hemolyticus), POA Sepsis indices have improved Continue micafungin, and Unasyn Suspected source PD PD catheter was removed Appreciate ID follow-up TTE reviewed, negative for vegetation, will need TEE probably next week Ophthalmology eval negative for endophthalmitis  Acute hypoxemic respiratory failure, currently on BiPAP  Acute pulmonary edema due to volume overload from ESRD, improved Suspected OHS/OSA Refused BiPAP overnight On nasal cannula oxygen  End-stage  renal disease on peritoneal dialysis in [place].  PD catheter out. Nephrology is following Received hemodialysis overnight Trend BMP and electrolytes Continue midodrine to 10 mg 3 times daily Vasculitis consulted for AV graft and possible terminal HD catheter  Chronic biventricular HFrEF Echo showed EF 45% with hypokinesis of RV Monitor intake and output GDMT as tolerated  Shock liver Monitor LFTs  Protein calorie malnutrition Continue dietary supplements  Hypervolemic hyponatremia Hypokalemia/hypophosphatemia Closely monitor electrolytes Aggressively supplement electrolytes  Anemia and thrombocytopenia of critical illness Monitor H&H and platelet count Watch for signs of bleeding Platelet count improved to 157  Best practice (daily eval):  Diet: Regular diet, DVT prophylaxis: heparin sq GI prophylaxis:  PPI Code Status: full code Family Communication: 8/11: Patient wife was updated at baseline    Cheri Fowler, MD [facility] See Amion for pager If no response to pager, please call [phone] until 7pm After 7pm, Please call E-link [phone]

## 2023-02-26 NOTE — Progress Notes (Deleted)
No chief complaint on file.   HISTORY OF PRESENT ILLNESS:  02/26/23 ALL:  [name] [name] is a 72 y.o. male here today for follow up for tremor, gait abnormalities and memory loss. He was seen in consult with Dr [name] [name] 08/2022.    HISTORY (copied from Dr [name] [name] previous note)  [name] [name] is a 72 year old male, seen in request by his primary care physician Dr. Felecia [name], [name] [name], for evaluation of worsening gait abnormality, memory loss, left hand tremor since open heart surgery, he is accompanied by his wife [name] [name] at today's clinical visit on September 11 2022   I reviewed and summarized the referring note. PMHX. CABG in Nov 2023 CAD in Jan 2023, s/p stent HLD Chronic low back pain DM HTN Left shoulder rotator cuff Left AV fistula placment,  ESRD-peritoneal dialysis since 2019. Penile prosthesis implant   He has end-stage renal disease, receiving peritoneal dialysis since 2019, has a left arm AV fistula, also had a history of left shoulder rotator cuff surgery in the past, around 2023, he noticed bilateral hands tremor, most noticeable when holding a cup, using utensils, writing, he denies family history of tremor   He also has a long history of diabetic peripheral neuropathy, numbness tingling, cold sensation at bilateral toes, fingers, using gabapentin which seems to help him   He underwent open heart surgery for coronary artery disease in November 2023, since then he was noted to have worsening bilateral hands tremor, wife also concerned about his worsening gait abnormality, he can still ambulate without assistant, he denies significant low back, neck pain, denies bowel bladder incontinence   REVIEW OF SYSTEMS: Out of a complete 14 system review of symptoms, the patient complains only of the following symptoms, and all other reviewed systems are negative.   ALLERGIES: Allergies  Allergen Reactions   Opana [Oxymorphone Hcl] Itching   Tramadol Itching     HOME  MEDICATIONS: Facility-Administered Medications Prior to Visit  Medication Dose Route Frequency Provider Last Rate Last Admin   0.9 %  sodium chloride infusion  250 mL Intravenous Continuous [name] Holler, MD   [name] at 02/22/23 1238   acetaminophen (TYLENOL) suppository 650 mg  650 mg Rectal Once PRN [name] Giovanni, MD       acetaminophen (TYLENOL) tablet 650 mg  650 mg Oral Q6H PRN [name] Fowler, MD       albuterol (PROVENTIL) (2.5 MG/3ML) 0.083% nebulizer solution 2.5 mg  2.5 mg Nebulization Q2H PRN [name] [name], [name] [name]       Ampicillin-Sulbactam (UNASYN) 3 g in sodium chloride 0.9 % 100 mL IVPB  3 g Intravenous Q12H [name] [name], [name] [name]   Stopped at 02/26/23 1118   aspirin EC tablet 81 mg  81 mg Oral Daily [name] [name], [name] [name]   81 mg at 02/26/23 1039   Chlorhexidine Gluconate Cloth 2 % PADS 6 each  6 each Topical Daily [name] Holler, MD   6 each at 02/26/23 1015   ciprofloxacin (CIPRO) tablet 500 mg  500 mg Oral q1800 [name] Earthly, MD   500 mg at 02/25/23 1812   clopidogrel (PLAVIX) tablet 75 mg  75 mg Oral Daily [name] [name], [name] [name]   75 mg at 02/26/23 1039   Darbepoetin Alfa (ARANESP) injection 60 mcg  60 mcg Subcutaneous Q Fri-1800 [name] Metz, MD   60 mcg at 02/23/23 2049   feeding supplement (ENSURE ENLIVE / ENSURE PLUS) liquid 237 mL  237 mL Oral BID BM  [name] Holler, MD   237 mL at 02/26/23 1041   Gerhardt's butt cream   Topical QID [name] Holler, MD   Given at 02/26/23 1050   haloperidol lactate (HALDOL) injection 2 mg  2 mg Intravenous Q6H PRN [name] Fowler, MD   2 mg at 02/26/23 1118   heparin injection 2,000 Units  2,000 Units Dialysis PRN [name] Metz, MD       heparin injection 3,000 Units  3,000 Units Dialysis PRN [name] Metz, MD       heparin injection 5,000 Units  5,000 Units Subcutaneous Q8H [name] [name], [name] [name]   5,000 Units at 02/26/23 1328   heparin sodium (porcine) injection 1,000 Units  1,000 Units Intracatheter PRN [name] Metz, MD        heparin sodium (porcine) injection 2,000 Units  2,000 Units Intravenous PRN [name] Metz, MD       insulin aspart (novoLOG) injection 0-5 Units  0-5 Units Subcutaneous QHS [name] [name], [name] [name]       insulin aspart (novoLOG) injection 0-6 Units  0-6 Units Subcutaneous TID WC [name] [name], [name] [name]   1 Units at 02/26/23 1327   loperamide (IMODIUM) capsule 2 mg  2 mg Oral PRN [name] Holler, MD   2 mg at 02/22/23 1849   micafungin (MYCAMINE) 200 mg in sodium chloride 0.9 % 100 mL IVPB  200 mg Intravenous Q24H [name] Holler, MD   Stopped at 02/26/23 0232   midodrine (PROAMATINE) tablet 10 mg  10 mg Oral TID WC [name] Fowler, MD   10 mg at 02/26/23 1326   midodrine (PROAMATINE) tablet 10 mg  10 mg Oral Q dialysis [name] Metz, MD       mometasone-formoterol [facility]) 200-5 MCG/ACT inhaler 2 puff  2 puff Inhalation BID [name] [name], [name] [name]   2 puff at 02/24/23 0745   ondansetron (ZOFRAN) injection 4 mg  4 mg Intravenous Q6H PRN [name] [name], [name] [name]   4 mg at 02/19/23 6644   Oral care mouth rinse  15 mL Mouth Rinse PRN [name] Peer, MD       pantoprazole (PROTONIX) EC tablet 40 mg  40 mg Oral BID [name] [name], [name] [name]   40 mg at 02/26/23 1039   sodium chloride flush (NS) 0.9 % injection 3 mL  3 mL Intravenous Q12H [name] [name], [name] [name]   3 mL at 02/26/23 1015   Outpatient Medications Prior to Visit  Medication Sig Dispense Refill   albuterol (VENTOLIN HFA) 108 (90 Base) MCG/ACT inhaler Inhale 2 puffs into the lungs every 6 (six) hours as needed for wheezing or shortness of breath. 18 g 11   ALPRAZolam (XANAX) 0.25 MG tablet Take 1 tablet (0.25 mg total) by mouth at bedtime as needed for anxiety. Take before MRI 1 tablet 0   aspirin EC 81 MG tablet Take 81 mg by mouth daily. Swallow whole.     atorvastatin (LIPITOR) 80 MG tablet Take 1 tablet (80 mg total) by mouth daily. 30 tablet 1   azithromycin (ZITHROMAX) 250 MG tablet Take 1 tablet (250 mg total) by mouth daily. Take first 2  tablets together, then 1 every day until finished. 6 tablet 0   clopidogrel (PLAVIX) 75 MG tablet Take 1 tablet (75 mg total) by mouth daily. Please take first dose at 9 pm on 12/4 30 tablet 1   DROPLET PEN NEEDLES 31G X 5 MM MISC USE TO INJECT INSULIN DAILY AS DIRECTED 100 each 3  esomeprazole (NEXIUM) 40 MG capsule TAKE 1 CAPSULE TWICE DAILY BEFORE MEALS 180 capsule 3   fluticasone (FLONASE) 50 MCG/ACT nasal spray Place 1 spray into both nostrils as needed for allergies or rhinitis. (Patient not taking: Reported on 02/16/2023)     gabapentin (NEURONTIN) 100 MG capsule Take 100 mg by mouth 3 (three) times daily.     HYDROcodone bit-homatropine (HYCODAN) 5-1.5 MG/5ML syrup Take 5 mLs by mouth every 6 (six) hours as needed for cough. 75 mL 0   insulin glargine (LANTUS SOLOSTAR) 100 UNIT/ML Solostar Pen Inject 22 Units into the skin at bedtime. 18 mL 3   linagliptin (TRADJENTA) 5 MG TABS tablet Take 1 tablet (5 mg total) by mouth daily. 90 tablet 3   midodrine (PROAMATINE) 5 MG tablet Take 5 mg by mouth 3 (three) times daily with meals.     ondansetron (ZOFRAN-ODT) 4 MG disintegrating tablet 4mg  ODT q4 hours prn nausea/vomit 10 tablet 0   ONETOUCH ULTRA test strip USE TO CHECK BLOOD SUGAR TWICE DAILY 200 strip 2   predniSONE (DELTASONE) 20 MG tablet Take 2 tablets (40 mg total) by mouth daily with breakfast. 10 tablet 0   sevelamer (RENAGEL) 800 MG tablet Take 2 tablets (1,600 mg total) by mouth 3 (three) times daily with meals. (Patient not taking: Reported on 02/16/2023) 180 tablet 1   SYMBICORT 160-4.5 MCG/ACT inhaler INHALE 2 PUFFS INTO THE LUNGS TWICE DAILY. 10.2 g 6   Vitamin D, Ergocalciferol, (DRISDOL) 1.25 MG (50000 UNIT) CAPS capsule Take 50,000 Units by mouth every 7 (seven) days. Monday       PAST MEDICAL HISTORY: Past Medical History:  Diagnosis Date   Anemia    Arthritis    Asthma    Bell palsy    CAD (coronary artery disease)    a. 2014 MV: abnl w/ infap ischemia; b. 03/2013 Cath:  aneurysmal bleb in the LAD w/ otw nonobs dzs-->Med Rx.   Chronic back pain    Chronic knee pain    a. 09/2015 s/p R TKA.   Chronic pain    Chronic shoulder pain    Chronic sinusitis    COPD (chronic obstructive pulmonary disease) (HCC)    Diabetes mellitus without complication (HCC)    type II    ESRD on peritoneal dialysis (HCC)    on peritoneal dialysis, [facility]   Essential hypertension    GERD (gastroesophageal reflux disease)    Gout    Gout    Hepatomegaly    noted on noncontrast CT 2015   History of hiatal hernia    Hyperlipidemia    Lateral meniscus tear    Obesity    Truncal   Obstructive sleep apnea    does not use cpap    On home oxygen therapy    uses 2l when is going somewhere per patient    PUD (peptic ulcer disease)    remote, reports f/u EGD about 8 years ago unremarkable    Reactive airway disease    related to exposure to chemical during 9/11   Sinusitis    Vitamin D deficiency      PAST SURGICAL HISTORY: Past Surgical History:  Procedure Laterality Date   ASAD LT SHOULDER  12/15/2008   left shoulder   AV FISTULA PLACEMENT Left 08/09/2016   Procedure: BRACHIOCEPHALIC ARTERIOVENOUS (AV) FISTULA CREATION LEFT ARM;  Surgeon: Sherren Kerns, MD;  Location: [facility] OR;  Service: Vascular;  Laterality: Left;   CAPD INSERTION N/A 10/07/2018   Procedure:  LAPAROSCOPIC PERITONEAL CATHETER PLACEMENT;  Surgeon: Kinsinger, De Blanch, MD;  Location: [facility] ORS;  Service: General;  Laterality: N/A;   CATARACT EXTRACTION W/PHACO Left 03/28/2016   Procedure: CATARACT EXTRACTION PHACO AND INTRAOCULAR LENS PLACEMENT LEFT EYE;  Surgeon: Jethro Bolus, MD;  Location: [facility] ORS;  Service: Ophthalmology;  Laterality: Left;  CDE: 4.77   CATARACT EXTRACTION W/PHACO Right 04/11/2016   Procedure: CATARACT EXTRACTION PHACO AND INTRAOCULAR LENS PLACEMENT RIGHT EYE; CDE:  4.74;  Surgeon: Jethro Bolus, MD;  Location: [facility] ORS;  Service: Ophthalmology;  Laterality: Right;   COLONOSCOPY   10/15/2008   Fields: Rectal polyp obliterated, not retrieved, hemorrhoids, single ascending colon diverticulum near the CV. Next colonoscopy April 2020   COLONOSCOPY N/A 12/25/2014   SLF: 1. Colorectal polyps (2) removed 2. Small internal hemorrhoids 3. the left colon is severely redundant. hyperplastic polyps   CORONARY ARTERY BYPASS GRAFT N/A 06/05/2022   Procedure: OFF PUMP CORONARY ARTERY BYPASS GRAFTING (CABG) X 2 BYPASSES USING LEFT INTERNAL MAMMARY ARTERY AND RIGHT LEG GREATER SAPHENOUS VEIN HARVESTED ENDOSCOPICALLY;  Surgeon: Corliss Skains, MD;  Location: [facility] OR;  Service: Open Heart Surgery;  Laterality: N/A;   CORONARY STENT INTERVENTION N/A 07/25/2021   Procedure: CORONARY STENT INTERVENTION;  Surgeon: Tonny Bollman, MD;  Location: [facility] INVASIVE CV LAB;  Service: Cardiovascular;  Laterality: N/A;   CORONARY STENT INTERVENTION N/A 12/26/2021   Procedure: CORONARY STENT INTERVENTION;  Surgeon: Yvonne Kendall, MD;  Location: [facility] INVASIVE CV LAB;  Service: Cardiovascular;  Laterality: N/A;   CORONARY STENT INTERVENTION N/A 01/20/2022   Procedure: CORONARY STENT INTERVENTION;  Surgeon: Tonny Bollman, MD;  Location: [name] INVASIVE CV LAB;  Service: Cardiovascular;  Laterality: N/A;   DOPPLER ECHOCARDIOGRAPHY     ESOPHAGOGASTRODUODENOSCOPY N/A 12/25/2014   SLF: 1. Anemia most likely due to CRI, gastritis, gastric polyps 2. Moderate non-erosive gastriits and mild duodenitis.  3.TWo large gstric polyps removed.    EYE SURGERY  12/22/2010   tear duct probing-[place]   FOREIGN BODY REMOVAL  03/29/2011   Procedure: REMOVAL FOREIGN BODY EXTREMITY;  Surgeon: Fuller Canada, MD;  Location: [facility];  Service: Orthopedics;  Laterality: Right;  Removal Foreign Body Right Thumb   IR FLUORO GUIDE CV LINE RIGHT  08/06/2018   IR FLUORO GUIDE CV LINE RIGHT  02/17/2023   IR US GUIDE VASC ACCESS RIGHT  08/06/2018   IR US GUIDE VASC ACCESS RIGHT  02/17/2023   KNEE ARTHROSCOPY  10/16/2007   left   KNEE  ARTHROSCOPY WITH LATERAL MENISECTOMY Right 10/14/2015   Procedure: LEFT KNEE ARTHROSCOPY WITH PARTIAL LATERAL MENISECTOMY;  Surgeon: Vickki Hearing, MD;  Location: [facility];  Service: Orthopedics;  Laterality: Right;   LEFT HEART CATH AND CORONARY ANGIOGRAPHY N/A 07/25/2021   Procedure: LEFT HEART CATH AND CORONARY ANGIOGRAPHY;  Surgeon: Tonny Bollman, MD;  Location: [facility] INVASIVE CV LAB;  Service: Cardiovascular;  Laterality: N/A;   LEFT HEART CATH AND CORONARY ANGIOGRAPHY N/A 12/26/2021   Procedure: LEFT HEART CATH AND CORONARY ANGIOGRAPHY;  Surgeon: Yvonne Kendall, MD;  Location: [facility] INVASIVE CV LAB;  Service: Cardiovascular;  Laterality: N/A;   LEFT HEART CATH AND CORONARY ANGIOGRAPHY N/A 01/20/2022   Procedure: LEFT HEART CATH AND CORONARY ANGIOGRAPHY;  Surgeon: Tonny Bollman, MD;  Location: [facility] INVASIVE CV LAB;  Service: Cardiovascular;  Laterality: N/A;   LEFT HEART CATHETERIZATION WITH CORONARY ANGIOGRAM N/A 03/28/2013   Procedure: LEFT HEART CATHETERIZATION WITH CORONARY ANGIOGRAM;  Surgeon: Marykay Lex, MD;  Location: [facility] CATH LAB;  Service: Cardiovascular;  Laterality:  N/A;   NM MYOVIEW LTD     PENILE PROSTHESIS IMPLANT N/A 08/16/2015   Procedure: PENILE PROTHESIS INFLATABLE, three piece, Excisional biopsy of Penile ulcer, Penile molding;  Surgeon: Jethro Bolus, MD;  Location: [facility] ORS;  Service: Urology;  Laterality: N/A;   PENILE PROSTHESIS IMPLANT N/A 12/24/2017   Procedure: REMOVAL AND  REPLACEMENT  COLOPLAST PENILE PROSTHESIS;  Surgeon: Crista Elliot, MD;  Location: [facility] ORS;  Service: Urology;  Laterality: N/A;   PORT-A-CATH REMOVAL N/A 02/19/2023   Procedure: REMOVAL OF PERITONEAL DIALYSIS CATHETER;  Surgeon: Victorino Sparrow, MD;  Location: [facility] OR;  Service: Vascular;  Laterality: N/A;   QUADRICEPS TENDON REPAIR  07/21/2011   Procedure: REPAIR QUADRICEP TENDON;  Surgeon: Fuller Canada, MD;  Location: [facility]S;  Service: Orthopedics;  Laterality: Right;   RIGHT/LEFT HEART  CATH AND CORONARY ANGIOGRAPHY N/A 05/30/2022   Procedure: RIGHT/LEFT HEART CATH AND CORONARY ANGIOGRAPHY;  Surgeon: Corky Crafts, MD;  Location[facility] INVASIVE CV LAB;  Service: Cardiovascular;  Laterality: N/A;   TEE WITHOUT CARDIOVERSION N/A 06/05/2022   Procedure: TRANSESOPHAGEAL ECHOCARDIOGRAM (TEE);  Surgeon: Corliss Skains, MD;  Location: [facility] OR;  Service: Open Heart Surgery;  Laterality: N/A;   TOENAIL EXCISION     removed x2-bilateral   UMBILICAL HERNIA REPAIR  07/17/2005   [place]     FAMILY HISTORY: Family History  Problem Relation Age of Onset   Hypertension Mother        MI   Cancer Mother        breast    Diabetes Mother    Diabetes Father    Hypertension Father    Hypertension Sister    Diabetes Sister    Arthritis Other    Asthma Other    Lung disease Other    Anesthesia problems Neg Hx    Hypotension Neg Hx    Malignant hyperthermia Neg Hx    Pseudochol deficiency Neg Hx    Colon cancer Neg Hx      SOCIAL HISTORY: Social History   Socioeconomic History   Marital status: Married    Spouse name: Not on file   Number of children: 2   Years of education: 12th grade   Highest education level: Not on file  Occupational History   Occupation: disabled   Occupation:      Associate Professor: UNEMPLOYED  Tobacco Use   Smoking status: Former    Current packs/day: 0.00    Average packs/day: 1 pack/day for 25.0 years (25.0 ttl pk-yrs)    Types: Cigarettes    Start date: 03/27/1985    Quit date: 03/27/2010    Years since quitting: 12.9   Smokeless tobacco: Never   Tobacco comments:    Quit x 7 years  Vaping Use   Vaping status: Never Used  Substance and Sexual Activity   Alcohol use: Not Currently    Comment: occasionally   Drug use: Yes    Types: Marijuana    Comment: cocaine- last time used- 11/24/2017 , marijuana-    Sexual activity: Yes  Other Topics Concern   Not on file  Social History Narrative   He quit smoking in 2010. 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 He developed pulmonary problems, became disabled because of lower airway disease in 2009.       WATCHES BASKETBALL. HIS TEAM IS [employer].   Social Determinants of [name]   Financial Resource Strain: Low Risk  (12/29/2021)  Overall Financial Resource Strain (CARDIA)    Difficulty of Paying Living Expenses: Not hard at all  Food Insecurity: No Food Insecurity (06/07/2022)   Hunger Vital Sign    Worried About Running Out of Food in the Last Year: Never true    Ran Out of Food in the Last Year: Never true  Transportation Needs: No Transportation Needs (06/07/2022)   PRAPARE - Administrator, Civil Service (Medical): No    Lack of Transportation (Non-Medical): No  Physical Activity: Sufficiently Active (12/29/2021)   Exercise Vital Sign    Days of Exercise per Week: 3 days    Minutes of Exercise per Session: 60 min  Stress: No Stress Concern Present (12/29/2021)   [employer] of Occupational [name] - Occupational Stress Questionnaire    Feeling of Stress : Not at all  Social Connections: Unknown (09/18/2022)   [facility] from [facility]   Social Network    Social Network: Not on file  Intimate Partner Violence: Unknown (09/18/2022)   Received from [name]   HITS    Physically Hurt: Not on file    Insult or Talk Down To: Not on file    Threaten Physical Harm: Not on file    Scream or Curse: Not on file     PHYSICAL EXAM  There were no vitals filed for this visit. There is no height or weight on file to calculate BMI.  Generalized: Well developed, in no acute distress  Cardiology: normal rate and rhythm, no murmur auscultated  Respiratory: clear to auscultation bilaterally    Neurological examination  Mentation: Alert oriented to time, place, history taking. Follows all commands speech and language fluent Cranial nerve II-XII: Pupils were equal round reactive to light. Extraocular  movements were full, visual field were full on confrontational test. Facial sensation and strength were normal. Uvula tongue midline. Head turning and shoulder shrug  were normal and symmetric. Motor: The motor testing reveals 5 over 5 strength of all 4 extremities. Good symmetric motor tone is noted throughout.  Sensory: Sensory testing is intact to soft touch on all 4 extremities. No evidence of extinction is noted.  Coordination: Cerebellar testing reveals good finger-nose-finger and heel-to-shin bilaterally.  Gait and station: Gait is normal. Tandem gait is normal. Romberg is negative. No drift is seen.  Reflexes: Deep tendon reflexes are symmetric and normal bilaterally.    DIAGNOSTIC DATA (LABS, IMAGING, TESTING) - I reviewed patient records, labs, notes, testing and imaging myself where available.  Lab Results  Component Value Date   WBC 9.5 02/25/2023   HGB 9.0 (L) 02/25/2023   HCT 28.3 (L) 02/25/2023   MCV 87.1 02/25/2023   PLT 157 02/25/2023      Component Value Date/Time   NA 135 02/25/2023 0537   NA 136 12/22/2021 1017   K 3.3 (L) 02/25/2023 0537   CL 97 (L) 02/25/2023 0537   CO2 26 02/25/2023 0537   GLUCOSE 126 (H) 02/25/2023 0537   BUN 12 02/25/2023 0537   BUN 58 (H) 12/22/2021 1017   CREATININE 3.48 (H) 02/25/2023 0537   CREATININE 18.89 (H) 02/09/2020 1126   CALCIUM 7.5 (L) 02/25/2023 0537   PROT 5.1 (L) 02/21/2023 0407   ALBUMIN <1.5 (L) 02/25/2023 0537   AST 75 (H) 02/21/2023 0407   ALT 178 (H) 02/21/2023 0407   ALKPHOS 87 02/21/2023 0407   BILITOT 1.2 02/21/2023 0407   GFRNONAA 18 (L) 02/25/2023 0537   GFRNONAA 2 (L) 02/09/2020 1126   GFRAA <  4 (L) 02/09/2020 1126   Lab Results  Component Value Date   CHOL 88 06/03/2022   HDL 32 (L) 06/03/2022   LDLCALC 36 06/03/2022   TRIG 98 06/03/2022   CHOLHDL 2.8 06/03/2022   Lab Results  Component Value Date   HGBA1C 7.1 (H) 02/17/2023   Lab Results  Component Value Date   VITAMINB12 688 09/11/2022    Lab Results  Component Value Date   TSH 1.860 09/11/2022        No data to display              09/11/2022   10:58 AM  Montreal Cognitive Assessment   Visuospatial/ Executive (0/5) 2  Naming (0/3) 3  Attention: Read list of digits (0/2) 1  Attention: Read list of letters (0/1) 1  Attention: Serial 7 subtraction starting at 100 (0/3) 3  Language: Repeat phrase (0/2) 1  Language : Fluency (0/1) 0  Abstraction (0/2) 2  Delayed Recall (0/5) 3  Orientation (0/6) 5  Total 21  Adjusted Score (based on education) 21     ASSESSMENT AND PLAN  72 y.o. year old male  has a past medical history of Anemia, Arthritis, Asthma, Bell palsy, CAD (coronary artery disease), Chronic back pain, Chronic knee pain, Chronic pain, Chronic shoulder pain, Chronic sinusitis, COPD (chronic obstructive pulmonary disease) (HCC), Diabetes mellitus without complication (HCC), ESRD on peritoneal dialysis (HCC), Essential hypertension, GERD (gastroesophageal reflux disease), Gout, Gout, Hepatomegaly, History of hiatal hernia, Hyperlipidemia, Lateral meniscus tear, Obesity, Obstructive sleep apnea, On home oxygen therapy, PUD (peptic ulcer disease), Reactive airway disease, Sinusitis, and Vitamin D deficiency. here with    No diagnosis found.  Madaline Guthrie ***.  Healthy lifestyle habits encouraged. *** will follow up with PCP as directed. *** will return to see me in ***, sooner if needed. *** verbalizes understanding and agreement with this plan.   No orders of the defined types were placed in this encounter.    No orders of the defined types were placed in this encounter.    Shawnie Dapper, MSN, FNP-C 02/26/2023, 1:57 PM  [facility] [address], [address] [place], [place] [address] [phone]

## 2023-02-27 ENCOUNTER — Encounter (HOSPITAL_COMMUNITY): Payer: Medicare Other | Admitting: Occupational Therapy

## 2023-02-27 ENCOUNTER — Ambulatory Visit: Payer: Medicare Other | Admitting: Family Medicine

## 2023-02-27 ENCOUNTER — Encounter: Payer: Self-pay | Admitting: Family Medicine

## 2023-02-27 DIAGNOSIS — G9340 Encephalopathy, unspecified: Secondary | ICD-10-CM | POA: Diagnosis not present

## 2023-02-27 LAB — GLUCOSE, CAPILLARY
Glucose-Capillary: 120 mg/dL — ABNORMAL HIGH (ref 70–99)
Glucose-Capillary: 138 mg/dL — ABNORMAL HIGH (ref 70–99)
Glucose-Capillary: 155 mg/dL — ABNORMAL HIGH (ref 70–99)
Glucose-Capillary: 163 mg/dL — ABNORMAL HIGH (ref 70–99)

## 2023-02-27 MED ORDER — HEPARIN SODIUM (PORCINE) 1000 UNIT/ML DIALYSIS
3000.0000 [IU] | INTRAMUSCULAR | Status: DC | PRN
  Filled 2023-02-27 (×2): qty 3

## 2023-02-27 MED ORDER — HALOPERIDOL LACTATE 5 MG/ML IJ SOLN
2.0000 mg | Freq: Once | INTRAMUSCULAR | Status: AC
  Administered 2023-02-27: 2 mg via INTRAVENOUS

## 2023-02-27 NOTE — Progress Notes (Addendum)
  Progress Note    02/27/2023 6:44 AM Hospital Day 12  Subjective:  RN reports that less agitated overnight with seroquel.  Pt resting comfortably.    afebrile  Vitals:   02/27/23 0500 02/27/23 0600  BP: 101/63 109/61  Pulse: 68 72  Resp: 19 18  Temp:    SpO2: 98% 99%    Physical Exam: General:  no distress.  Not agitated.  Lungs:  non labored Extremities:  pulsatile left arm fistula  CBC    Component Value Date/Time   WBC 9.5 02/25/2023 0537   RBC 3.25 (L) 02/25/2023 0537   HGB 9.0 (L) 02/25/2023 0537   HGB 11.3 (L) 12/22/2021 1017   HCT 28.3 (L) 02/25/2023 0537   HCT 33.7 (L) 12/22/2021 1017   PLT 157 02/25/2023 0537   PLT 117 (L) 12/22/2021 1017   MCV 87.1 02/25/2023 0537   MCV 91 12/22/2021 1017   MCH 27.7 02/25/2023 0537   MCHC 31.8 02/25/2023 0537   RDW 19.5 (H) 02/25/2023 0537   RDW 13.7 12/22/2021 1017   LYMPHSABS 0.1 (L) 02/19/2023 0458   MONOABS 0.1 02/19/2023 0458   EOSABS 0.0 02/19/2023 0458   BASOSABS 0.0 02/19/2023 0458    BMET    Component Value Date/Time   NA 135 02/25/2023 0537   NA 136 12/22/2021 1017   K 3.3 (L) 02/25/2023 0537   CL 97 (L) 02/25/2023 0537   CO2 26 02/25/2023 0537   GLUCOSE 126 (H) 02/25/2023 0537   BUN 12 02/25/2023 0537   BUN 58 (H) 12/22/2021 1017   CREATININE 3.48 (H) 02/25/2023 0537   CREATININE 18.89 (H) 02/09/2020 1126   CALCIUM 7.5 (L) 02/25/2023 0537   GFRNONAA 18 (L) 02/25/2023 0537   GFRNONAA 2 (L) 02/09/2020 1126   GFRAA <4 (L) 02/09/2020 1126    INR    Component Value Date/Time   INR 1.2 02/18/2023 0425     Intake/Output Summary (Last 24 hours) at 02/27/2023 0644 Last data filed at 02/27/2023 0300 Gross per 24 hour  Intake 309.97 ml  Output --  Net 309.97 ml     Assessment/Plan:  72 y.o. male with PD catheter related peritonitis with known end-stage renal disease previously on dialysis via PD catheter which has now been removed. He also has a fistula that has been present in the left upper  arm for many years but was unable to be used and is pulsatile on exam.   Hospital Day 12  -possibly for [facility] ([facility]) and fistulogram tomorrow with Dr. Karin Lieu in [facility] lab.  Wife will be in later today and Dr. Randie Heinz will talk with her about procedure.   -npo after MN/labs ordered.  Will wait to put in order for consent until after Dr. Randie Heinz has talked with wife.    Doreatha Massed, PA-C [facility] [phone] 02/27/2023 6:44 AM  I have independently interviewed and examined patient and agree with PA assessment and plan above.  Patient is somnolent today.  Plan will be for left upper arm fistulogram and tunneled dialysis catheter tomorrow in the Cath Lab with Dr. Karin Lieu.   C. Randie Heinz, MD [facility] [place] Office: [phone] Pager: [phone]

## 2023-02-27 NOTE — Progress Notes (Signed)
Pt arrived to the floor around 1700 via stretcher by RN. Pt alert and oriented x 2 in no acute distress. VSS. Respirations even and unlabored on room. Pt oriented to room. Call bell within reach. Bed in low position and bed alarm on.

## 2023-02-27 NOTE — Progress Notes (Addendum)
eLink Physician-Brief Progress Note Patient Name: [name] [name] DOB: 08/24/1950 MRN: [id number]   Date of Service  02/27/2023  HPI/Events of Note  Patient continues to be agitated He has received seroquel and haldol 2 mg at 2200. Can receive up to 5 mg usually.  eICU Interventions  OK to give additional 2 mg haldol now     Intervention Category Minor Interventions: Agitation / anxiety - evaluation and management   Mechele Collin 02/27/2023, 12:35 AM

## 2023-02-27 NOTE — Progress Notes (Signed)
Subjective: Agitated overnight.  More calm this morning.  Getting dialysis with no significant problems so far.  Does feel short of breath coughing up a good bit of sputum.  No other complaints  Objective Vital signs in last 24 hours: Vitals:   02/27/23 0945 02/27/23 1000 02/27/23 1015 02/27/23 1019  BP:  (!) 163/146 92/70 (!) 87/71  Pulse: (!) 101 68 82 77  Resp: (!) 26 19 20  (!) 23  Temp:      TempSrc:      SpO2: (!) 89% 100% 100% 100%  Weight:      Height:       Weight change:   Physical Exam: General: Awake and alert Heart: Normal rate, no rub Lungs: Chest rise no increased work of breathing Abdomen: soft, obese, PD cath has been removed Extremities: No pedal edema, warm and well-perfused LUA AVF +positive thrill  temp HD cath in place Neuro: as above   Home med --> albuterol, xanax, aspirin, aotrvastatin, clopidogrel, esomeprazole, gabapentin, insulin glargine, linagliptin, midodrine   Outpatient PD orders:  Followed by [facility] (note his PD RN was on vacation  but nephrology called [place] location for orders  [facility] - [facility] - 100kg 5 cycles, 3000 fill vol (note pt states on consult he thinks is 2.5 liters), total time: 10 hrs, last fill 1500 cc Mircera 150 mcg q monthly Venofer 100mg  once a month L AVF +thrill but small + tortuous    CXR 8/05 - IMPRESSION: Persistent bibasilar opacities, which may represent atelectasis or pneumonia.  Assessment/ Plan: Peritonitis PD catheter related peritonitis -also with blood cultures positive for fungus.  Followed by ID on antibiotics as well as micafungin.  PD catheter removed on 8/5.  Per ccm / ID.  Vol overload/ pulm edema -had diet assist on Fri and Sat. Get CXR today.  Ongoing fluid removal with dialysis as tolerated.  Missed dialysis yesterday getting dialysis today.   ESRD - pt rec'd PD initially, then PD cath pulled due to infection. Had HD 3x this past week, last one on Sat.  Missed  dialysis yesterday.  TTS schedule this week.  Return to MWF schedule next week HD access - PD not working 8/2 --> IR placed temp HD catheter and underwent HD 8/3.  PD cath removed 8/05 due to fungal sepsis. Had iHD on 8/06 via temp cath then it was dc'd for line holiday. L arm AVF was not usable when we tried on 8/08 (infiltrated, tortuous, too deep?). Temp cath replaced 8/09 by CCM, line holiday completed and cultures were clear on 8/5. Discussed with ID today who is okay with permanent catheter placement.  Vascular surgery planning for fistulogram and possible tunneled dialysis catheter placement tomorrow.  We appreciate the help Hypotension - chronic issue on home midodrine 5 tid. Here ^'d to 10mg  tid + 10mg  pre HD. Continue until we can get him to a new dry wt then can back off.  Anemia esrd - Hb 9's, started darbe 60 mcg weekly while here.  Secondary hyperparathyroidism -corrected calcium normal, phos low, stop binder.  Darnell Level  02/27/2023, 10:26 AM  Recent Labs  Lab 02/24/23 0333 02/24/23 1326 02/25/23 0537  HGB 8.3* 9.2* 9.0*  ALBUMIN <1.5*  --  <1.5*  CALCIUM 7.7*  --  7.5*  PHOS 3.4  --  2.5  CREATININE 6.82*  --  3.48*  K 3.5 3.8 3.3*    Inpatient medications:  aspirin EC  81 mg Oral Daily  Chlorhexidine Gluconate Cloth  6 each Topical Daily   ciprofloxacin  500 mg Oral q1800   clopidogrel  75 mg Oral Daily   darbepoetin (ARANESP) injection - DIALYSIS  60 mcg Subcutaneous Q Fri-1800   feeding supplement  237 mL Oral BID BM   Gerhardt's butt cream   Topical QID   heparin  5,000 Units Subcutaneous Q8H   insulin aspart  0-5 Units Subcutaneous QHS   insulin aspart  0-6 Units Subcutaneous TID WC   midodrine  10 mg Oral TID WC   mometasone-formoterol  2 puff Inhalation BID   pantoprazole  40 mg Oral BID   sodium chloride flush  3 mL Intravenous Q12H    sodium chloride Stopped (02/22/23 1238)   ampicillin-sulbactam (UNASYN) IV Stopped (02/26/23 2152)   micafungin  (MYCAMINE) 200 mg in sodium chloride 0.9 % 100 mL IVPB Stopped (02/27/23 0244)   acetaminophen, acetaminophen, albuterol, haloperidol lactate, heparin, [START ON 02/28/2023] heparin, heparin sodium (porcine), heparin sodium (porcine), loperamide, midodrine, ondansetron (ZOFRAN) IV, mouth rinse         Labs: Basic Metabolic Panel: Recent Labs  Lab 02/23/23 0352 02/24/23 0333 02/24/23 1326 02/25/23 0537  NA 137 133* 135 135  K 3.9 3.5 3.8 3.3*  CL 95* 95*  --  97*  CO2 24 26  --  26  GLUCOSE 184* 147*  --  126*  BUN 61* 26*  --  12  CREATININE 12.36* 6.82*  --  3.48*  CALCIUM 7.9* 7.7*  --  7.5*  PHOS 6.2* 3.4  --  2.5   Liver Function Tests: Recent Labs  Lab 02/21/23 0407 02/23/23 0352 02/24/23 0333 02/25/23 0537  AST 75*  --   --   --   ALT 178*  --   --   --   ALKPHOS 87  --   --   --   BILITOT 1.2  --   --   --   PROT 5.1*  --   --   --   ALBUMIN <1.5* <1.5* <1.5* <1.5*   No results for input(s): "LIPASE", "AMYLASE" in the last 168 hours.  No results for input(s): "AMMONIA" in the last 168 hours.  CBC: Recent Labs  Lab 02/21/23 0407 02/22/23 0023 02/23/23 0352 02/24/23 0333 02/24/23 1326 02/25/23 0537  WBC 8.7 11.9* 9.4 10.2  --  9.5  HGB 8.6* 8.9* 8.6* 8.3* 9.2* 9.0*  HCT 26.3* 27.9* 26.8* 26.2* 27.0* 28.3*  MCV 84.0 85.1 87.3 85.3  --  87.1  PLT 127* 133* 106* 126*  --  157   Cardiac Enzymes: No results for input(s): "CKTOTAL", "CKMB", "CKMBINDEX", "TROPONINI" in the last 168 hours.  CBG: Recent Labs  Lab 02/26/23 1140 02/26/23 1534 02/26/23 2121 02/27/23 0359 02/27/23 0824  GLUCAP 152* 129* 155* 120* 138*    Studies/Results: No results found. Medications:  sodium chloride Stopped (02/22/23 1238)   ampicillin-sulbactam (UNASYN) IV Stopped (02/26/23 2152)   micafungin (MYCAMINE) 200 mg in sodium chloride 0.9 % 100 mL IVPB Stopped (02/27/23 0244)    aspirin EC  81 mg Oral Daily   Chlorhexidine Gluconate Cloth  6 each Topical Daily    ciprofloxacin  500 mg Oral q1800   clopidogrel  75 mg Oral Daily   darbepoetin (ARANESP) injection - DIALYSIS  60 mcg Subcutaneous Q Fri-1800   feeding supplement  237 mL Oral BID BM   Gerhardt's butt cream   Topical QID   heparin  5,000 Units Subcutaneous Q8H   insulin aspart  0-5 Units Subcutaneous QHS   insulin aspart  0-6 Units Subcutaneous TID WC   midodrine  10 mg Oral TID WC   mometasone-formoterol  2 puff Inhalation BID   pantoprazole  40 mg Oral BID   sodium chloride flush  3 mL Intravenous Q12H

## 2023-02-27 NOTE — Progress Notes (Signed)
OT Cancellation Note  Patient Details Name: [name] [name] MRN: [id number] DOB: [birth year]   Cancelled Treatment:    Reason Eval/Treat Not Completed: Patient at procedure or test/ unavailable Pt currently on HD. Will follow up in PM as schedule permits. Lorre Munroe 02/27/2023, 9:57 AM

## 2023-02-27 NOTE — Procedures (Signed)
I was present at this dialysis session. I have reviewed the session itself and made appropriate changes.   Filed Weights   02/25/23 0108 02/26/23 0334 02/27/23 0827  Weight: 95 kg 95 kg 95.2 kg    Recent Labs  Lab 02/25/23 0537  NA 135  K 3.3*  CL 97*  CO2 26  GLUCOSE 126*  [name] 12  CREATININE 3.48*  CALCIUM 7.5*  PHOS 2.5    Recent Labs  Lab 02/23/23 0352 02/24/23 0333 02/24/23 1326 02/25/23 0537  WBC 9.4 10.2  --  9.5  HGB 8.6* 8.3* 9.2* 9.0*  HCT 26.8* 26.2* 27.0* 28.3*  MCV 87.3 85.3  --  87.1  PLT 106* 126*  --  157    Scheduled Meds:  aspirin EC  81 mg Oral Daily   Chlorhexidine Gluconate Cloth  6 each Topical Daily   ciprofloxacin  500 mg Oral q1800   clopidogrel  75 mg Oral Daily   darbepoetin (ARANESP) injection - DIALYSIS  60 mcg Subcutaneous Q Fri-1800   feeding supplement  237 mL Oral BID BM   Gerhardt's butt cream   Topical QID   heparin  5,000 Units Subcutaneous Q8H   insulin aspart  0-5 Units Subcutaneous QHS   insulin aspart  0-6 Units Subcutaneous TID WC   midodrine  10 mg Oral TID WC   mometasone-formoterol  2 puff Inhalation BID   pantoprazole  40 mg Oral BID   sodium chloride flush  3 mL Intravenous Q12H   Continuous Infusions:  sodium chloride Stopped (02/22/23 1238)   ampicillin-sulbactam (UNASYN) IV Stopped (02/26/23 2152)   micafungin (MYCAMINE) 200 mg in sodium chloride 0.9 % 100 mL IVPB Stopped (02/27/23 0244)   PRN Meds:.acetaminophen, acetaminophen, albuterol, haloperidol lactate, heparin, [START ON 02/28/2023] heparin, heparin sodium (porcine), heparin sodium (porcine), loperamide, midodrine, ondansetron (ZOFRAN) IV, mouth rinse   [name] Bun,  MD 02/27/2023, 10:29 AM

## 2023-02-27 NOTE — Procedures (Signed)
Received patient in bed to unit.  Alert and oriented.  Informed consent signed and in chart.   TX duration: 3.25  Patient tolerated well.   Confused, without acute distress.  Hand-off given to patient's nurse.   Access used: Right Interjugular catheter.  Access issues: NONE  Total UF removed: 2.7L Medication(s) given: Midodrine.    [facility]

## 2023-02-27 NOTE — Progress Notes (Signed)
**Note De-Identified vi Obfusction** PROGRESS NOTE    CROLE GRNILLO  ZOX:[id number] DOB: 02-02-1951 DO: 02/14/2023 PCP: Benett Spr, MD  Chief Complint  Ptient presents with   Cough    Brief Nrrtive:   18 yer old BMI 35.5 obese mle with  history of bypss in 2023, COPD not otherwise specified, type 2 dibetes mellitus, cid reflux, ESRD on peritonel dilysis, hypotension on midodrine.  Sometime mid June 2024 hd COVID-19 nd treted with ugmentin.  Subsequent to tht hd cough nd poor orl intke nd lethrgy.     [facility] for confusion nd worsening shortness of breth nd hypersomnolence.  t the time of dmission is requiring 2 L nsl cnnul nd he hd trnsminitis.  Chest x-ry suggested pneumoni nd  proclcitonin over 150 with  fever of 102.  Strted on brod ntibiotics nd trnsferred to  [facility] cmpus.  Working dignosis ws peritonitis from his peritonel dilysis ctheter bsed on cell count 2200 nd hzy fluid in the peritonel dilyste  -02/14/2023 - dmit -02/16/2023 felt to be responding to ntibiotics more wke.  Nursing noticed him to be mbultory to the bthroom.  Home Xnx nd Neurontin were being held.  For his symptomtic trnsminitis he hd right upper qudrnt ultrsound tht suggested cirrhosis.  He did hve peritonel dilysis on 02/16/2023 -02/17/2023: Mlfunction of peritonel dilysis ctheter with complints of bdominl pin.  Hs subsequent sttus post temporry HD ctheter right internl jugulr vein.  He then underwent emergent dilysis nd returned bck to the unit t round 10 PM 02/17/2023.  Nursing noticed mrked difference.  He ws less interctive.  He ws lert nd hving  gze but not following commnds.  He would mumble  word.  His temperture ws 101.5.  Tchypneic hving tremors oxygen needs now t 5 L [.  Hemodilysis ws t 4 L] nd sturting 100% sttus post -1.2 L on hemodilysis.  Chest x-ry done showed  worsening pulm infiltrtes prticulrly lower lobes compred to dmission.  Criticl cre medicine consulted.   -02/18/23: erly hours - CCM consult -8/5 blood cultures >> Cndid glbrt, stph species -8/5 PD ctheter removed by vsculr surgery -8/6 micfungin.  Ceftrixone chnged to Unsyn bsed on culture dt -8/7 bck to ICU w encephlopthy -8/8 line holidy. unble to cnnulte VF for HD. -8/9 temp HD line plced.  -8/13 TRH ssumed cre  ssessment & Pln:   Principl Problem:   cute encephlopthy ctive Problems:   Hyperlipidemi   Essentil hypertension   GERD   OS on CPP   CD (coronry rtery disese)   Morbid obesity due to excess clories (HCC)   ESRD (end stge renl disese) (HCC)   NSTEMI (non-ST elevted myocrdil infrction) (HCC)   S/P CBG x 2   Peritonel dilysis ctheter in plce n[facility])   Memory loss  cute metbolic encephlopthy In the setting of uremi nd peritonitis Reportedly mentl sttus is improving but intermittently he gets hyperctive gitted nd restless Received seroquel overnight lst night, hldol -- he's  bit lethrgic tody, will hold further doses for now Continue delirium precutions Prn hldol s needed His home Xnx nd gbpentin remin on hold    Sepsis due to peritonitis Cndid glbrt, Enterococcus vium, Stph Hemolyticus Bcteremi  Continue micfungin, nd Unsyn Suspected source PD PD ctheter ws removed pprecite ID follow-up - lst note 02/22/23 -> unsyn + micfungin, pln for 14 dys with 8/7 s dy 1 (through 03/06/22) (no pln for TEE due to trnsient bcteremi from peritonitis) TTE reviewed, no  reported vegetation Ophthalmology eval negative for endophthalmitis (02/21/2023 note from Dr. Georga Hacking)   Acute hypoxemic respiratory failure, currently on BiPAP  Acute pulmonary edema due to volume overload from ESRD Suspected OHS/OSA Apparently has previously refused bipap at night (was not on bipap last  night) On nasal cannula oxygen, currently on 6 L  Recent CXR 8/9 with indistinct opacities at the lung bases Repeat CXR 8/14 Wean as tolerated  Volume per dialysis with renal    End-stage renal disease on peritoneal dialysis in [place].  PD catheter out. Nephrology is following Received hemodialysis overnight Trend BMP and electrolytes Continue midodrine to 10 mg 3 times daily Vascular surgery planning for fistulogram and possible TDC placement 8/14   Chronic biventricular HFrEF Echo showed EF 40-45%, moderately reduced RVSF (echo 05/2022 had EF 45-50%) Monitor intake and output GDMT as tolerated Volume per renal and HD   Shock liver Monitor LFTs   Protein calorie malnutrition Continue dietary supplements   Hypervolemic hyponatremia Hypokalemia/hypophosphatemia Closely monitor electrolytes Aggressively supplement electrolytes   Anemia and thrombocytopenia of critical illness Monitor H&H and platelet count Platelet count improved     DVT prophylaxis: heparin Code Status: full Family Communication: none Disposition:   Status is: Inpatient Remains inpatient appropriate because: need for continued inpatient care   Consultants:  Renal Vascular Pccm ID  Procedures:  8/5 Peritoneal dialysis catheter removal  8/4 EEG IMPRESSION: This study is suggestive of mild to moderate diffuse encephalopathy, nonspecific etiology but likely related to sedation, toxic-metabolic etiology. 8/3 Temporary HD Catheter   Antimicrobials:  Anti-infectives (From admission, onward)    Start     Dose/Rate Route Frequency Ordered Stop   02/25/23 1800  ciprofloxacin (CIPRO) tablet 500 mg        500 mg Oral Daily-1800 02/25/23 1516     02/24/23 0200  micafungin (MYCAMINE) 200 mg in sodium chloride 0.9 % 100 mL IVPB        200 mg 110 mL/hr over 1 Hours Intravenous Every 24 hours 02/23/23 0807     02/21/23 1000  Ampicillin-Sulbactam (UNASYN) 3 g in sodium chloride 0.9 % 100 mL IVPB         3 g 200 mL/hr over 30 Minutes Intravenous Every 12 hours 02/20/23 1254     02/20/23 1400  Ampicillin-Sulbactam (UNASYN) 3 g in sodium chloride 0.9 % 100 mL IVPB  Status:  Discontinued        3 g 200 mL/hr over 30 Minutes Intravenous Every 12 hours 02/20/23 1251 02/20/23 1254   02/20/23 1400  Ampicillin-Sulbactam (UNASYN) 3 g in sodium chloride 0.9 % 100 mL IVPB        3 g 200 mL/hr over 30 Minutes Intravenous  Once 02/20/23 1254 02/21/23 0818   02/19/23 1300  cefTRIAXone (ROCEPHIN) 2 g in sodium chloride 0.9 % 100 mL IVPB  Status:  Discontinued        2 g 200 mL/hr over 30 Minutes Intravenous Every 24 hours 02/19/23 0926 02/20/23 1251   02/19/23 0200  micafungin (MYCAMINE) 100 mg in sodium chloride 0.9 % 100 mL IVPB  Status:  Discontinued        100 mg 105 mL/hr over 1 Hours Intravenous Every 24 hours 02/19/23 0107 02/23/23 0807   02/18/23 1400  piperacillin-tazobactam (ZOSYN) IVPB 2.25 g  Status:  Discontinued        2.25 g 100 mL/hr over 30 Minutes Intravenous Every 8 hours 02/18/23 0017 02/19/23 0926   02/17/23 2315  vancomycin (VANCOCIN) IVPB 1000 mg/200  mL premix        1,000 mg 200 mL/hr over 60 Minutes Intravenous  Once 02/17/23 2220 02/17/23 2343   02/16/23 1600  ceFEPIme (MAXIPIME) 1 g in sodium chloride 0.9 % 100 mL IVPB  Status:  Discontinued        1 g 200 mL/hr over 30 Minutes Intravenous Every 24 hours 02/15/23 1246 02/15/23 1254   02/16/23 1400  ceFEPIme (MAXIPIME) 1 g in sodium chloride 0.9 % 100 mL IVPB  Status:  Discontinued        1 g 200 mL/hr over 30 Minutes Intravenous Every 24 hours 02/15/23 1716 02/18/23 0009   02/15/23 1700  vancomycin variable dose per unstable renal function (pharmacist dosing)  Status:  Discontinued         Does not apply See admin instructions 02/15/23 1702 02/19/23 0926   02/15/23 1400  ceFEPIme (MAXIPIME) 2 g in sodium chloride 0.9 % 100 mL IVPB  Status:  Discontinued        2 g 200 mL/hr over 30 Minutes Intravenous Every 24 hours  02/15/23 1245 02/15/23 1716   02/15/23 1300  vancomycin (VANCOREADY) IVPB 2000 mg/400 mL        2,000 mg 200 mL/hr over 120 Minutes Intravenous  Once 02/15/23 1245 02/15/23 1456   02/15/23 1230  metroNIDAZOLE (FLAGYL) IVPB 500 mg  Status:  Discontinued        500 mg 100 mL/hr over 60 Minutes Intravenous Every 12 hours 02/15/23 1218 02/16/23 1346   02/15/23 0915  azithromycin (ZITHROMAX) 500 mg in sodium chloride 0.9 % 250 mL IVPB  Status:  Discontinued        500 mg 250 mL/hr over 60 Minutes Intravenous Every 24 hours 02/15/23 0908 02/15/23 1657   02/15/23 0915  cefTRIAXone (ROCEPHIN) 2 g in sodium chloride 0.9 % 100 mL IVPB        2 g 200 mL/hr over 30 Minutes Intravenous  Once 02/15/23 0908 02/15/23 1022   02/15/23 0000  azithromycin (ZITHROMAX) 250 MG tablet        250 mg Oral Daily 02/15/23 0155         Subjective: No new complaints  Objective: Vitals:   02/27/23 1300 02/27/23 1400 02/27/23 1500 02/27/23 1511  BP:      Pulse: (!) 211 71 75   Resp: 20 (!) 25 (!) 27   Temp:    98.9 F (37.2 C)  TempSrc:    Axillary  SpO2: (!) 83% 98% 92%   Weight:      Height:        Intake/Output Summary (Last 24 hours) at 02/27/2023 1705 Last data filed at 02/27/2023 1226 Gross per 24 hour  Intake 209.97 ml  Output 16336.67 ml  Net -16126.7 ml   Filed Weights   02/26/23 0334 02/27/23 0827 02/27/23 1231  Weight: 95 kg 95.2 kg 92.5 kg    Examination:  General: No acute distress. Cardiovascular: RRR Lungs: unlabored Abdomen: Soft, nontender, nondistended Neurological: lethargic Extremities: No clubbing or cyanosis. No edema.  Data Reviewed: I have personally reviewed following labs and imaging studies  CBC: Recent Labs  Lab 02/21/23 0407 02/22/23 0023 02/23/23 0352 02/24/23 0333 02/24/23 1326 02/25/23 0537  WBC 8.7 11.9* 9.4 10.2  --  9.5  HGB 8.6* 8.9* 8.6* 8.3* 9.2* 9.0*  HCT 26.3* 27.9* 26.8* 26.2* 27.0* 28.3*  MCV 84.0 85.1 87.3 85.3  --  87.1  PLT 127* 133*  106* 126*  --  157  Basic Metabolic Panel: Recent Labs  Lab 02/21/23 0407 02/22/23 0023 02/23/23 0352 02/24/23 0333 02/24/23 1326 02/25/23 0537  N 135 134* 137 133* 135 135  K 4.0 4.0 3.9 3.5 3.8 3.3*  CL 94* 93* 95* 95*  --  97*  CO2 26 24 24 26   --  26  GLUCOSE 81 86 184* 147*  --  126*  BUN 34* 46* 61* 26*  --  12  CRETININE 8.88* 10.57* 12.36* 6.82*  --  3.48*  CLCIUM 7.4* 7.9* 7.9* 7.7*  --  7.5*  MG 2.0 2.1 2.5* 1.7  --  1.8  PHOS  --  5.1* 6.2* 3.4  --  2.5    GFR: Estimated Creatinine Clearance: 20.4 mL/min () (by C-G formula based on SCr of 3.48 mg/dL (H)).  Liver Function Tests: Recent Labs  Lab 02/21/23 0407 02/23/23 0352 02/24/23 0333 02/25/23 0537  ST 75*  --   --   --   LT 178*  --   --   --   LKPHOS 87  --   --   --   BILITOT 1.2  --   --   --   PROT 5.1*  --   --   --   LBUMIN <1.5* <1.5* <1.5* <1.5*    CBG: Recent Labs  Lab 02/26/23 1534 02/26/23 2121 02/27/23 0359 02/27/23 0824 02/27/23 1224  GLUCP 129* 155* 120* 138* 155*     Recent Results (from the past 240 hour(s))  Respiratory (~20 pathogens) panel by PCR     Status: None   Collection Time: 02/18/23 12:45 M   Specimen: Nasopharyngeal Swab; Respiratory  Result Value Ref Range Status   denovirus NOT DETECTED NOT DETECTED Final   Coronavirus 229E NOT DETECTED NOT DETECTED Final    Comment: (NOTE) The Coronavirus on the Respiratory Panel, DOES NOT test for the novel  Coronavirus (2019 nCoV)    Coronavirus HKU1 NOT DETECTED NOT DETECTED Final   Coronavirus NL63 NOT DETECTED NOT DETECTED Final   Coronavirus OC43 NOT DETECTED NOT DETECTED Final   Metapneumovirus NOT DETECTED NOT DETECTED Final   Rhinovirus / Enterovirus NOT DETECTED NOT DETECTED Final   Influenza  NOT DETECTED NOT DETECTED Final   Influenza B NOT DETECTED NOT DETECTED Final   Parainfluenza Virus 1 NOT DETECTED NOT DETECTED Final   Parainfluenza Virus 2 NOT DETECTED NOT DETECTED Final    Parainfluenza Virus 3 NOT DETECTED NOT DETECTED Final   Parainfluenza Virus 4 NOT DETECTED NOT DETECTED Final   Respiratory Syncytial Virus NOT DETECTED NOT DETECTED Final   Bordetella pertussis NOT DETECTED NOT DETECTED Final   Bordetella Parapertussis NOT DETECTED NOT DETECTED Final   Chlamydophila pneumoniae NOT DETECTED NOT DETECTED Final   Mycoplasma pneumoniae NOT DETECTED NOT DETECTED Final    Comment: Performed at [facility], [address] [place], [place] [address]  Culture, blood (Routine X 2) w Reflex to ID Panel     Status: None   Collection Time: 02/18/23  4:16 M   Specimen: BLOOD RIGHT HND  Result Value Ref Range Status   Specimen Description BLOOD RIGHT HND  Final   Special Requests   Final    BOTTLES DRWN EROBIC ND NEROBIC Blood Culture adequate volume   Culture   Final    NO GROWTH 5 DYS Performed at [facility], [address] [place], [place] [address]    Report Status 02/23/2023 FINL  Final  Culture, blood (Routine X 2) w  Reflex to ID Panel     Status: bnormal   Collection Time: 02/18/23  4:41 M   Specimen: BLOOD  Result Value Ref Range Status   Specimen Description BLOOD RIGHT THUMB  Final   Special Requests   Final    BOTTLES DRWN EROBIC ND NEROBIC Blood Culture adequate volume   Culture  Setup Time   Final    Organism ID to follow GRM POSITIVE COCCI YEST NEROBIC BOTTLE ONLY CRITICL RESULT CLLED TO, RED BCK BY ND VERIFIED WITH: J WYLND,PHRMD@[id number]  02/19/23 MK    Culture ()  Final    CNDID GLBRT Sent to [facility] for further susceptibility testing. ENTEROCOCCUS VIUM STPHYLOCOCCUS HEMOLYTICUS THE SIGNIFICNCE OF ISOLTING THIS ORGNISM FROM  SINGLE SET OF BLOOD CULTURES WHEN MULTIPLE SETS RE DRWN IS UNCERTIN. PLESE NOTIFY THE MICROBIOLOGY DEPRTMENT WITHIN ONE WEEK IF SPECITION ND SENSITIVITIES RE REQUIRED. SEE SEPRTE REPORT Performed at [facility], [address] [place], [place]  [address]    Report Status 02/25/2023 FINL  Final   Organism ID, Bacteria ENTEROCOCCUS VIUM  Final      Susceptibility   Enterococcus avium - MIC*    MPICILLIN <=2 SENSITIVE Sensitive     VNCOMYCIN <=0.5 SENSITIVE Sensitive     GENTMICIN SYNERGY SENSITIVE Sensitive     * ENTEROCOCCUS VIUM  Blood Culture ID Panel (Reflexed)     Status: bnormal   Collection Time: 02/18/23  4:41 M  Result Value Ref Range Status   Enterococcus faecalis NOT DETECTED NOT DETECTED Final   Enterococcus Faecium NOT DETECTED NOT DETECTED Final   Listeria monocytogenes NOT DETECTED NOT DETECTED Final   Staphylococcus species DETECTED () NOT DETECTED Final    Comment: CRITICL RESULT CLLED TO, RED BCK BY ND VERIFIED WITH: J WYLND,PHRMD@0056  02/19/23 MK    Staphylococcus aureus (BCID) NOT DETECTED NOT DETECTED Final   Staphylococcus epidermidis NOT DETECTED NOT DETECTED Final   Staphylococcus lugdunensis NOT DETECTED NOT DETECTED Final   Streptococcus species NOT DETECTED NOT DETECTED Final   Streptococcus agalactiae NOT DETECTED NOT DETECTED Final   Streptococcus pneumoniae NOT DETECTED NOT DETECTED Final   Streptococcus pyogenes NOT DETECTED NOT DETECTED Final   .calcoaceticus-baumannii NOT DETECTED NOT DETECTED Final   Bacteroides fragilis NOT DETECTED NOT DETECTED Final   Enterobacterales NOT DETECTED NOT DETECTED Final   Enterobacter cloacae complex NOT DETECTED NOT DETECTED Final   Escherichia coli NOT DETECTED NOT DETECTED Final   Klebsiella aerogenes NOT DETECTED NOT DETECTED Final   Klebsiella oxytoca NOT DETECTED NOT DETECTED Final   Klebsiella pneumoniae NOT DETECTED NOT DETECTED Final   Proteus species NOT DETECTED NOT DETECTED Final   Salmonella species NOT DETECTED NOT DETECTED Final   Serratia marcescens NOT DETECTED NOT DETECTED Final   Haemophilus influenzae NOT DETECTED NOT DETECTED Final   Neisseria meningitidis NOT DETECTED NOT DETECTED Final   Pseudomonas aeruginosa NOT  DETECTED NOT DETECTED Final   Stenotrophomonas maltophilia NOT DETECTED NOT DETECTED Final   Candida albicans NOT DETECTED NOT DETECTED Final   Candida auris NOT DETECTED NOT DETECTED Final   Candida glabrata DETECTED () NOT DETECTED Final    Comment: CRITICL RESULT CLLED TO, RED BCK BY ND VERIFIED WITH: J [facility]  02/19/23 MK    Candida krusei NOT DETECTED NOT DETECTED Final   Candida parapsilosis NOT DETECTED NOT DETECTED Final   Candida tropicalis NOT DETECTED NOT DETECTED Final   Cryptococcus neoformans/gattii NOT DETECTED NOT DETECTED Final    Comment: Performed at [facility], [address] [place], [place] [address]  Antifungal AST 9 Drug Panel     Status: None   Collection Time: 02/18/23  4:41 AM  Result Value Ref Range Status   Organism ID, Yeast Candida glabrata  Final    Comment: (NOTE) Identification performed by account, not confirmed by this laboratory.    Amphotericin B MIC 1.0 ug/mL  Final    Comment: (NOTE) Breakpoints have been established for only some organism-drug combinations as indicated. This test was developed and its performance characteristics determined by [facility]. It has not been cleared or approved by the Food and Drug Administration.    Anidulafungin MIC Comment  Final    Comment: (NOTE) 0.06 ug/mL Susceptible Breakpoints have been established for only some organism-drug combinations as indicated. This test was developed and its performance characteristics determined by [facility]. It has not been cleared or approved by the Food and Drug Administration.    Caspofungin MIC Comment  Final    Comment: (NOTE) 0.25 ug/mL Intermediate Breakpoints have been established for only some organism-drug combinations as indicated. This test was developed and its performance characteristics determined by [facility]. It has not been cleared or approved by the Food and Drug Administration.    Micafungin MIC Comment  Final    Comment:  (NOTE) 0.016 ug/mL Susceptible Breakpoints have been established for only some organism-drug combinations as indicated. This test was developed and its performance characteristics determined by [facility]. It has not been cleared or approved by the Food and Drug Administration.    Posaconazole MIC 2.0 ug/mL  Final    Comment: (NOTE) Breakpoints have been established for only some organism-drug combinations as indicated. This test was developed and its performance characteristics determined by [facility]. It has not been cleared or approved by the Food and Drug Administration.    Fluconazole Islt MIC 32.0 ug/mL  Final    Comment: (NOTE) Susceptible Dose Dependent Breakpoints have been established for only some organism-drug combinations as indicated. This test was developed and its performance characteristics determined by [facility]. It has not been cleared or approved by the Food and Drug Administration.    Flucytosine MIC 0.06 ug/mL or less  Final    Comment: (NOTE) Breakpoints have been established for only some organism-drug combinations as indicated. This test was developed and its performance characteristics determined by [facility]. It has not been cleared or approved by the Food and Drug Administration.    Itraconazole MIC 1.0 ug/mL  Final    Comment: (NOTE) Breakpoints have been established for only some organism-drug combinations as indicated. This test was developed and its performance characteristics determined by [facility]. It has not been cleared or approved by the Food and Drug Administration.    Voriconazole MIC 0.5 ug/mL  Final    Comment: (NOTE) Breakpoints have been established for only some organism-drug combinations as indicated. This test was developed and its performance characteristics determined by [facility]. It has not been cleared or approved by the Food and Drug Administration. Performed At: [facility] [address] [place], [place]  [id number] Jolene Schimke MD GE:[id number]    Source CANDIDA GLABRATA/ BLOOD  Final    Comment: Performed at [facility], [address], [place], [place] [address]  Culture, blood (Routine X 2) w Reflex to ID Panel     Status: None   Collection Time: 02/19/23  8:50 AM   Specimen: BLOOD RIGHT HAND  Result Value Ref Range Status   Specimen Description BLOOD RIGHT HAND  Final  Special Requests   Final    BOTTLES DRWN EROBIC ONLY Blood Culture results may not be optimal due to an inadequate volume of blood received in culture bottles   Culture   Final    NO GROWTH 5 DYS Performed at [facility], [address], [place], [place] [address]    Report Status 02/24/2023 FINL  Final  erobic/naerobic Culture w Gram Stain (surgical/deep wound)     Status: None (Preliminary result)   Collection Time: 02/19/23  4:26 PM   Specimen: Path fluid; Body Fluid  Result Value Ref Range Status   Specimen Description FLUID PLEURL  Final   Special Requests PT ON CEFZOLIN  Final   Gram Stain   Final    RRE WBC PRESENT,BOTH PMN ND MONONUCLER NO ORGNISMS SEEN    Culture   Final    RRE ROSEOMONS SPECIES ISOLTE REFERRED FOR ID/SUSCEPT NO NEROBES ISOLTED Performed at [facility], [address], [place], [place] [address]    Report Status PENDING  Incomplete  Susceptibility, er + naerob     Status: bnormal   Collection Time: 02/19/23  4:26 PM  Result Value Ref Range Status   Suscept, er + naerob Preliminary report ()  Final    Comment: (NOTE) Performed t: [facility] [address] [place], [place] [id number] Jolene Schimke MD MV:[id number]    Source of Sample PLEURL  Final    Comment: Performed at [facility], [address] [place], [place] [address]  Susceptibility Result     Status: bnormal   Collection Time: 02/19/23  4:26 PM  Result Value Ref Range Status   Suscept Result 1 Roseomonas gilardii ()  Corrected    Comment:  (NOTE) Performed t: [facility] [place] [address] [place], [place] [id number] Jolene Schimke MD DG:[id number] CORRECTED ON 08/13 T 9563: PREVIOUSLY REPORTED S Roseomonas species   Culture, blood (Routine X 2) w Reflex to ID Panel     Status: None   Collection Time: 02/19/23  6:55 PM   Specimen: BLOOD RIGHT HND  Result Value Ref Range Status   Specimen Description BLOOD RIGHT HND  Final   Special Requests   Final    BOTTLES DRWN EROBIC ONLY Blood Culture results may not be optimal due to an inadequate volume of blood received in culture bottles   Culture   Final    NO GROWTH 5 DYS Performed at [facility], [address] [place], [place] [address]    Report Status 02/24/2023 FINL  Final         Radiology Studies: No results found.      Scheduled Meds:  aspirin EC  81 mg Oral Daily   Chlorhexidine Gluconate Cloth  6 each Topical Daily   ciprofloxacin  500 mg Oral q1800   clopidogrel  75 mg Oral Daily   darbepoetin (RNESP) injection - DILYSIS  60 mcg Subcutaneous Q Fri-1800   feeding supplement  237 mL Oral BID BM   Gerhardt's butt cream   Topical QID   heparin  5,000 Units Subcutaneous Q8H   insulin aspart  0-5 Units Subcutaneous QHS   insulin aspart  0-6 Units Subcutaneous TID WC   midodrine  10 mg Oral TID WC   mometasone-formoterol  2 puff Inhalation BID   pantoprazole  40 mg Oral BID   sodium chloride flush  3 mL Intravenous Q12H   Continuous Infusions:  sodium chloride Stopped (02/22/23 1238)   ampicillin-sulbactam (  UNASYN) IV 3 g (02/27/23 1031)   micafungin (MYCAMINE) 200 mg in sodium chloride 0.9 % 100 mL IVPB Stopped (02/27/23 0244)     LOS: 12 days    Time spent: over 30 min    Lacretia Nicks, MD [facility]   To contact the attending provider between 7A-7P or the covering provider during after hours 7P-7A, please log into the web site [email or web address] and access using universal [place] password for that web  site. If you do not have the password, please call the hospital operator.  02/27/2023, 5:05 PM

## 2023-02-28 ENCOUNTER — Encounter (HOSPITAL_COMMUNITY): Payer: Medicare Other

## 2023-02-28 ENCOUNTER — Inpatient Hospital Stay (HOSPITAL_COMMUNITY): Payer: Medicare Other

## 2023-02-28 ENCOUNTER — Encounter (HOSPITAL_COMMUNITY): Payer: Self-pay | Admitting: *Deleted

## 2023-02-28 ENCOUNTER — Inpatient Hospital Stay (HOSPITAL_COMMUNITY)
Admission: EM | Disposition: A | Discharge status: Nursing Home (Includes ICF) | Payer: Self-pay | Source: Home / Self Care | Attending: Internal Medicine

## 2023-02-28 DIAGNOSIS — Z951 Presence of aortocoronary bypass graft: Secondary | ICD-10-CM

## 2023-02-28 DIAGNOSIS — G9340 Encephalopathy, unspecified: Secondary | ICD-10-CM | POA: Diagnosis not present

## 2023-02-28 DIAGNOSIS — I214 Non-ST elevation (NSTEMI) myocardial infarction: Secondary | ICD-10-CM

## 2023-02-28 DIAGNOSIS — N186 End stage renal disease: Secondary | ICD-10-CM | POA: Diagnosis not present

## 2023-02-28 DIAGNOSIS — I10 Essential (primary) hypertension: Secondary | ICD-10-CM | POA: Diagnosis not present

## 2023-02-28 HISTORY — PX: PERIPHERAL VASCULAR BALLOON ANGIOPLASTY: CATH118281

## 2023-02-28 HISTORY — PX: A/V FISTULAGRAM: CATH118298

## 2023-02-28 LAB — GLUCOSE, CAPILLARY
Glucose-Capillary: 180 mg/dL — ABNORMAL HIGH (ref 70–99)
Glucose-Capillary: 146 mg/dL — ABNORMAL HIGH (ref 70–99)
Glucose-Capillary: 126 mg/dL — ABNORMAL HIGH (ref 70–99)
Glucose-Capillary: 183 mg/dL — ABNORMAL HIGH (ref 70–99)

## 2023-02-28 SURGERY — A/V FISTULAGRAM
Anesthesia: LOCAL

## 2023-02-28 MED ORDER — LIDOCAINE HCL (PF) 1 % IJ SOLN
INTRAMUSCULAR | Status: AC
  Filled 2023-02-28: qty 30

## 2023-02-28 MED ORDER — FENTANYL CITRATE (PF) 100 MCG/2ML IJ SOLN
INTRAMUSCULAR | Status: AC
  Filled 2023-02-28: qty 2

## 2023-02-28 MED ORDER — ONDANSETRON HCL 4 MG/2ML IJ SOLN
INTRAMUSCULAR | Status: DC | PRN
  Administered 2023-02-28: 4 mg via INTRAVENOUS

## 2023-02-28 MED ORDER — FENTANYL CITRATE (PF) 100 MCG/2ML IJ SOLN
INTRAMUSCULAR | Status: DC | PRN
  Administered 2023-02-28 (×3): 50 ug via INTRAVENOUS

## 2023-02-28 MED ORDER — HEPARIN SODIUM (PORCINE) 1000 UNIT/ML IJ SOLN
INTRAMUSCULAR | Status: AC
  Filled 2023-02-28: qty 10

## 2023-02-28 MED ORDER — HEPARIN SODIUM (PORCINE) 1000 UNIT/ML IJ SOLN
INTRAMUSCULAR | Status: DC | PRN
  Administered 2023-02-28: 5000 [IU] via INTRAVENOUS

## 2023-02-28 MED ORDER — IODIXANOL 320 MG/ML IV SOLN
INTRAVENOUS | Status: DC | PRN
  Administered 2023-02-28: 90 mL

## 2023-02-28 MED ORDER — HEPARIN (PORCINE) IN NACL 1000-0.9 UT/500ML-% IV SOLN
INTRAVENOUS | Status: DC | PRN
  Administered 2023-02-28: 500 mL

## 2023-02-28 MED ORDER — ONDANSETRON HCL 4 MG/2ML IJ SOLN
INTRAMUSCULAR | Status: AC
  Filled 2023-02-28: qty 2

## 2023-02-28 SURGICAL SUPPLY — 20 items
BALLN IN.PACT DCB 7X40 (BALLOONS) ×2
BALLN MUSTANG 6X60X75 (BALLOONS) ×4
BALLN MUSTANG 7.0X40 75 (BALLOONS) ×2
BALLN MUSTANG 8X60X75 (BALLOONS) ×2
BALLOON MUSTANG 6X60X75 (BALLOONS) IMPLANT
BALLOON MUSTANG 7.0X40 75 (BALLOONS) IMPLANT
BALLOON MUSTANG 8X60X75 (BALLOONS) IMPLANT
COVER DOME SNAP 22 D (MISCELLANEOUS) ×3 IMPLANT
DCB IN.PACT 7X40 (BALLOONS) IMPLANT
GLIDEWIRE ADV .035X260CM (WIRE) IMPLANT
KIT ENCORE 26 ADVANTAGE (KITS) IMPLANT
KIT MICROPUNCTURE NIT STIFF (SHEATH) IMPLANT
PROTECTION STATION PRESSURIZED (MISCELLANEOUS) ×2
SHEATH PINNACLE R/O II 6F 4CM (SHEATH) IMPLANT
SHEATH PINNACLE R/O II 7F 4CM (SHEATH) IMPLANT
SHEATH PROBE COVER 6X72 (BAG) ×3 IMPLANT
STATION PROTECTION PRESSURIZED (MISCELLANEOUS) ×3 IMPLANT
TRAY PV CATH (CUSTOM PROCEDURE TRAY) ×3 IMPLANT
TUBING CIL FLEX 10 FLL-RA (TUBING) ×3 IMPLANT
WIRE BENTSON .035X145CM (WIRE) IMPLANT

## 2023-02-28 NOTE — Progress Notes (Signed)
Cardiac Individual Treatment Plan  Patient Details  Name: [name] [name] MRN: [id number] Date of Birth: Oct 26, 1950 Referring Provider:   Flowsheet Row CARDIAC REHAB PHASE II EXERCISE from 01/08/2023 in [facility] CARDIAC REHABILITATION  Referring Provider Dr. Rennis Golden       Initial Encounter Date:  Flowsheet Row CARDIAC REHAB PHASE II EXERCISE from 01/08/2023 in [facility] CARDIAC REHABILITATION  Date 01/08/23       Visit Diagnosis: NSTEMI (non-ST elevated myocardial infarction) (HCC)  S/P CABG x 2  Patient's Home Medications on Admission: No current facility-administered medications for this visit.  Current Outpatient Medications:    azithromycin (ZITHROMAX) 250 MG tablet, Take 1 tablet (250 mg total) by mouth daily. Take first 2 tablets together, then 1 every day until finished., Disp: 6 tablet, Rfl: 0   DROPLET PEN NEEDLES 31G X 5 MM MISC, USE TO INJECT INSULIN DAILY AS DIRECTED, Disp: 100 each, Rfl: 3   esomeprazole (NEXIUM) 40 MG capsule, TAKE 1 CAPSULE TWICE DAILY BEFORE MEALS, Disp: 180 capsule, Rfl: 3   HYDROcodone bit-homatropine (HYCODAN) 5-1.5 MG/5ML syrup, Take 5 mLs by mouth every 6 (six) hours as needed for cough., Disp: 75 mL, Rfl: 0   ondansetron (ZOFRAN-ODT) 4 MG disintegrating tablet, 4mg  ODT q4 hours prn nausea/vomit, Disp: 10 tablet, Rfl: 0   predniSONE (DELTASONE) 20 MG tablet, Take 2 tablets (40 mg total) by mouth daily with breakfast., Disp: 10 tablet, Rfl: 0  Facility-Administered Medications Ordered in Other Visits:    0.9 %  sodium chloride infusion, 250 mL, Intravenous, Continuous, Charlott Holler, MD, Held at 02/22/23 1238   acetaminophen (TYLENOL) suppository 650 mg, 650 mg, Rectal, Once PRN, Ceaser Giovanni, MD   acetaminophen (TYLENOL) tablet 650 mg, 650 mg, Oral, Q6H PRN, Merrily Pew, Sudham, MD   albuterol (PROVENTIL) (2.5 MG/3ML) 0.083% nebulizer solution 2.5 mg, 2.5 mg, Nebulization, Q2H PRN, Schuh, McKenzi P, PA-C   Ampicillin-Sulbactam (UNASYN) 3 g  in sodium chloride 0.9 % 100 mL IVPB, 3 g, Intravenous, Q12H, Ann Held, RPH, Stopped at 02/27/23 2205   aspirin EC tablet 81 mg, 81 mg, Oral, Daily, Schuh, McKenzi P, PA-C, 81 mg at 02/27/23 1028   Chlorhexidine Gluconate Cloth 2 % PADS 6 each, 6 each, Topical, Daily, Charlott Holler, MD, 6 each at 02/27/23 1029   ciprofloxacin (CIPRO) tablet 500 mg, 500 mg, Oral, q1800, Danelle Earthly, MD, 500 mg at 02/27/23 1847   clopidogrel (PLAVIX) tablet 75 mg, 75 mg, Oral, Daily, Schuh, McKenzi P, PA-C, 75 mg at 02/27/23 1028   Darbepoetin Alfa (ARANESP) injection 60 mcg, 60 mcg, Subcutaneous, Q Fri-1800, Delano Metz, MD, 60 mcg at 02/23/23 2049   feeding supplement (ENSURE ENLIVE / ENSURE PLUS) liquid 237 mL, 237 mL, Oral, BID BM, Charlott Holler, MD, 237 mL at 02/27/23 1453   Gerhardt's butt cream, , Topical, QID, Charlott Holler, MD, Given at 02/27/23 1453   haloperidol lactate (HALDOL) injection 2 mg, 2 mg, Intravenous, Q6H PRN, Cheri Fowler, MD, 2 mg at 02/27/23 2123   heparin injection 3,000 Units, 3,000 Units, Dialysis, PRN, Delano Metz, MD   heparin injection 3,000 Units, 3,000 Units, Dialysis, PRN, Delano Metz, MD   heparin injection 5,000 Units, 5,000 Units, Subcutaneous, Q8H, Schuh, McKenzi P, PA-C, 5,000 Units at 02/28/23 0646   heparin sodium (porcine) injection 1,000 Units, 1,000 Units, Intracatheter, PRN, Delano Metz, MD, 1,000 Units at 02/27/23 1215   heparin sodium (porcine) injection 2,000 Units, 2,000 Units, Intravenous, PRN, Delano Metz, MD   insulin  aspart (novoLOG) injection 0-5 Units, 0-5 Units, Subcutaneous, QHS, Schuh, McKenzi P, PA-C   insulin aspart (novoLOG) injection 0-6 Units, 0-6 Units, Subcutaneous, TID WC, Schuh, McKenzi P, PA-C, 1 Units at 02/27/23 1842   loperamide (IMODIUM) capsule 2 mg, 2 mg, Oral, PRN, Charlott Holler, MD, 2 mg at 02/22/23 1849   micafungin (MYCAMINE) 200 mg in sodium chloride 0.9 % 100 mL IVPB, 200 mg, Intravenous, Q24H,  Charlott Holler, MD, Stopped at 02/28/23 0416   midodrine (PROAMATINE) tablet 10 mg, 10 mg, Oral, TID WC, Chand, Sudham, MD, 10 mg at 02/27/23 1153   midodrine (PROAMATINE) tablet 10 mg, 10 mg, Oral, Q dialysis, Delano Metz, MD   mometasone-formoterol (DULERA) 200-5 MCG/ACT inhaler 2 puff, 2 puff, Inhalation, BID, Schuh, McKenzi P, PA-C, 2 puff at 02/28/23 0723   ondansetron (ZOFRAN) injection 4 mg, 4 mg, Intravenous, Q6H PRN, Schuh, McKenzi P, PA-C, 4 mg at 02/19/23 0454   Oral care mouth rinse, 15 mL, Mouth Rinse, PRN, Byrum, Les Pou, MD   pantoprazole (PROTONIX) EC tablet 40 mg, 40 mg, Oral, BID, Chand, Sudham, MD, 40 mg at 02/27/23 2123   sodium chloride flush (NS) 0.9 % injection 3 mL, 3 mL, Intravenous, Q12H, Schuh, McKenzi P, PA-C, 3 mL at 02/27/23 2124  Past Medical History: Past Medical History:  Diagnosis Date   Anemia    Arthritis    Asthma    Bell palsy    CAD (coronary artery disease)    a. 2014 MV: abnl w/ infap ischemia; b. 03/2013 Cath: aneurysmal bleb in the LAD w/ otw nonobs dzs-->Med Rx.   Chronic back pain    Chronic knee pain    a. 09/2015 s/p R TKA.   Chronic pain    Chronic shoulder pain    Chronic sinusitis    COPD (chronic obstructive pulmonary disease) (HCC)    Diabetes mellitus without complication (HCC)    type II    ESRD on peritoneal dialysis (HCC)    on peritoneal dialysis, [facility]   Essential hypertension    GERD (gastroesophageal reflux disease)    Gout    Gout    Hepatomegaly    noted on noncontrast CT 2015   History of hiatal hernia    Hyperlipidemia    Lateral meniscus tear    Obesity    Truncal   Obstructive sleep apnea    does not use cpap    On home oxygen therapy    uses 2l when is going somewhere per patient    PUD (peptic ulcer disease)    remote, reports f/u EGD about 8 years ago unremarkable    Reactive airway disease    related to exposure to chemical during 9/11   Sinusitis    Vitamin D deficiency      Tobacco Use: Social History   Tobacco Use  Smoking Status Former   Current packs/day: 0.00   Average packs/day: 1 pack/day for 25.0 years (25.0 ttl pk-yrs)   Types: Cigarettes   Start date: 03/27/1985   Quit date: 03/27/2010   Years since quitting: 12.9  Smokeless Tobacco Never  Tobacco Comments   Quit x 7 years    Labs: Review Flowsheet  More data exists      Latest Ref Rng & Units 02/17/2023 02/18/2023 02/20/2023 02/21/2023 02/24/2023  Labs for ITP Cardiac and Pulmonary Rehab  Hemoglobin A1c 4.8 - 5.6 % 7.1  - - - -  PH, Arterial 7.35 - 7.45 - - 7.430  7.48  7.431   PCO2 arterial 32 - 48 mmHg - - 35.7  35  44.3   Bicarbonate 20.0 - 28.0 mmol/L - 29.9  23.7  25.9  29.5   TCO2 22 - 32 mmol/L - - 25  - 31   O2 Saturation % - 60.5  92  100  99     Details            Capillary Blood Glucose: Lab Results  Component Value Date   GLUCAP 171 (H) 02/27/2023   GLUCAP 163 (H) 02/27/2023   GLUCAP 155 (H) 02/27/2023   GLUCAP 138 (H) 02/27/2023   GLUCAP 120 (H) 02/27/2023     Exercise Target Goals: Exercise Program Goal: Individual exercise prescription set using results from initial 6 min walk test and THRR while considering  patient's activity barriers and safety.   Exercise Prescription Goal: Starting with aerobic activity 30 plus minutes a day, 3 days per week for initial exercise prescription. Provide home exercise prescription and guidelines that participant acknowledges understanding prior to discharge.  Activity Barriers & Risk Stratification:  Activity Barriers & Cardiac Risk Stratification - 01/03/23 1027       Activity Barriers & Cardiac Risk Stratification   Activity Barriers Arthritis;Back Problems;Neck/Spine Problems;Joint Problems;Deconditioning;Shortness of Breath;Balance Concerns;Assistive Device    Cardiac Risk Stratification High             6 Minute Walk:  6 Minute Walk     Row Name 01/08/23 1200         6 Minute Walk   Phase Initial      Distance 1000 feet     Walk Time 5 minutes     # of Rest Breaks 0     MPH 1.89     METS 1.84     RPE 12     VO2 Peak 6.45     Symptoms No     Resting HR 66 bpm     Resting BP 120/72     Resting Oxygen Saturation  96 %     Exercise Oxygen Saturation  during 6 min walk 96 %     Max Ex. HR 99 bpm     Max Ex. BP 140/76     2 Minute Post BP 126/76              Oxygen Initial Assessment:   Oxygen Re-Evaluation:   Oxygen Discharge (Final Oxygen Re-Evaluation):   Initial Exercise Prescription:  Initial Exercise Prescription - 01/08/23 1200       Date of Initial Exercise RX and Referring Provider   Date 01/08/23    Referring Provider Dr. Rennis Golden    Expected Discharge Date 03/30/23      Treadmill   MPH 1.5    Grade 0    Minutes 17      NuStep   Level 1    SPM 80    Minutes 22      Prescription Details   Frequency (times per week) 3    Duration Progress to 30 minutes of continuous aerobic without signs/symptoms of physical distress      Intensity   THRR 40-80% of Max Heartrate 59-118    Ratings of Perceived Exertion 11-13    Perceived Dyspnea 0-4      Resistance Training   Training Prescription Yes    Weight 3    Reps 10-15             Perform Capillary Blood Glucose checks as needed.  Exercise Prescription Changes:   Exercise Prescription Changes     Row Name 01/17/23 1300 01/26/23 1100 01/29/23 1200         Response to Exercise   Blood Pressure (Admit) 130/64 -- 126/62     Blood Pressure (Exercise) 140/64 -- 152/84     Blood Pressure (Exit) 136/62 -- 120/70     Heart Rate (Admit) 67 bpm -- 71 bpm     Heart Rate (Exercise) 79 bpm -- 91 bpm     Heart Rate (Exit) 82 bpm -- 70 bpm     Rating of Perceived Exertion (Exercise) 12 -- 12     Duration Continue with 30 min of aerobic exercise without signs/symptoms of physical distress. -- Continue with 30 min of aerobic exercise without signs/symptoms of physical distress.     Intensity THRR  unchanged -- THRR unchanged       Progression   Progression Continue to progress workloads to maintain intensity without signs/symptoms of physical distress. -- Continue to progress workloads to maintain intensity without signs/symptoms of physical distress.       Resistance Training   Training Prescription Yes -- Yes     Weight 2 -- 2     Reps 10-15 -- 10-15     Time 10 Minutes -- --       Treadmill   MPH 1.5 -- 1.5     Grade 0 -- 0     Minutes 17 -- 15     METs 2.15 -- 2.15       NuStep   Level 2 -- 2     SPM 63 -- 100     Minutes 22 -- 15     METs 1.9 -- 2.2       Home Exercise Plan   Plans to continue exercise at -- Home (comment)  walking, seated elliptical --     Frequency -- Add 2 additional days to program exercise sessions. --     Initial Home Exercises Provided -- 01/26/23 --       Oxygen   Maintain Oxygen Saturation -- -- 88% or higher              Exercise Comments:   Exercise Goals and Review:   Exercise Goals     Row Name 01/09/23 0746 01/30/23 0840           Exercise Goals   Increase Physical Activity Yes Yes      Intervention Provide advice, education, support and counseling about physical activity/exercise needs.;Develop an individualized exercise prescription for aerobic and resistive training based on initial evaluation findings, risk stratification, comorbidities and participant's personal goals. Provide advice, education, support and counseling about physical activity/exercise needs.;Develop an individualized exercise prescription for aerobic and resistive training based on initial evaluation findings, risk stratification, comorbidities and participant's personal goals.      Expected Outcomes Short Term: Attend rehab on a regular basis to increase amount of physical activity.;Long Term: Add in home exercise to make exercise part of routine and to increase amount of physical activity.;Long Term: Exercising regularly at least 3-5 days a week.  Short Term: Attend rehab on a regular basis to increase amount of physical activity.;Long Term: Add in home exercise to make exercise part of routine and to increase amount of physical activity.;Long Term: Exercising regularly at least 3-5 days a week.      Increase Strength and Stamina Yes Yes      Intervention Provide advice, education, support and counseling about  physical activity/exercise needs.;Develop an individualized exercise prescription for aerobic and resistive training based on initial evaluation findings, risk stratification, comorbidities and participant's personal goals. Provide advice, education, support and counseling about physical activity/exercise needs.;Develop an individualized exercise prescription for aerobic and resistive training based on initial evaluation findings, risk stratification, comorbidities and participant's personal goals.      Expected Outcomes Short Term: Increase workloads from initial exercise prescription for resistance, speed, and METs.;Short Term: Perform resistance training exercises routinely during rehab and add in resistance training at home;Long Term: Improve cardiorespiratory fitness, muscular endurance and strength as measured by increased METs and functional capacity ( ) Short Term: Increase workloads from initial exercise prescription for resistance, speed, and METs.;Short Term: Perform resistance training exercises routinely during rehab and add in resistance training at home;Long Term: Improve cardiorespiratory fitness, muscular endurance and strength as measured by increased METs and functional capacity ( )      Able to understand and use rate of perceived exertion (RPE) scale Yes Yes      Intervention Provide education and explanation on how to use RPE scale Provide education and explanation on how to use RPE scale      Expected Outcomes Short Term: Able to use RPE daily in rehab to express subjective intensity level;Long Term:  Able to use RPE  to guide intensity level when exercising independently Short Term: Able to use RPE daily in rehab to express subjective intensity level;Long Term:  Able to use RPE to guide intensity level when exercising independently      Knowledge and understanding of Target Heart Rate Range (THRR) Yes Yes      Intervention Provide education and explanation of THRR including how the numbers were predicted and where they are located for reference Provide education and explanation of THRR including how the numbers were predicted and where they are located for reference      Expected Outcomes Short Term: Able to state/look up THRR;Long Term: Able to use THRR to govern intensity when exercising independently;Short Term: Able to use daily as guideline for intensity in rehab Short Term: Able to state/look up THRR;Long Term: Able to use THRR to govern intensity when exercising independently;Short Term: Able to use daily as guideline for intensity in rehab      Able to check pulse independently Yes Yes      Intervention Provide education and demonstration on how to check pulse in carotid and radial arteries.;Review the importance of being able to check your own pulse for safety during independent exercise Provide education and demonstration on how to check pulse in carotid and radial arteries.;Review the importance of being able to check your own pulse for safety during independent exercise      Expected Outcomes Short Term: Able to explain why pulse checking is important during independent exercise;Long Term: Able to check pulse independently and accurately Short Term: Able to explain why pulse checking is important during independent exercise;Long Term: Able to check pulse independently and accurately      Understanding of Exercise Prescription Yes Yes      Intervention Provide education, explanation, and written materials on patient's individual exercise prescription Provide education, explanation, and written materials on  patient's individual exercise prescription      Expected Outcomes Short Term: Able to explain program exercise prescription;Long Term: Able to explain home exercise prescription to exercise independently Short Term: Able to explain program exercise prescription;Long Term: Able to explain home exercise prescription to exercise independently  Exercise Goals Re-Evaluation :  Exercise Goals Re-Evaluation     Row Name 01/26/23 1135 01/30/23 0840           Exercise Goal Re-Evaluation   Exercise Goals Review Increase Physical Activity;Increase Strength and Stamina;Able to understand and use rate of perceived exertion (RPE) scale;Able to understand and use Dyspnea scale;Knowledge and understanding of Target Heart Rate Range (THRR);Able to check pulse independently;Understanding of Exercise Prescription Increase Physical Activity;Increase Strength and Stamina;Able to understand and use rate of perceived exertion (RPE) scale;Knowledge and understanding of Target Heart Rate Range (THRR);Able to check pulse independently;Understanding of Exercise Prescription      Comments Reviewed home exercise with pt today.  Pt plans to walk and use seated ellipitcal at home for exercise.  Reviewed THR, pulse, RPE, sign and symptoms, pulse oximetery and when to call 911 or MD.  Also discussed weather considerations and indoor options.  Pt voiced understanding. Pt has completed 8 sessions of cardiac rehab. He is currently exercising at 2.2 METs on the stepper. He is planning on walking and using seated ellipitcal at home to exercise on days that he is not in class. Will continue to monitor and progress as able.      Expected Outcomes Short: Start to add in exercise at home on off days Long: Continue to exercise indpedently Through exercise at rehab and home, patient will acheive their goals.                Discharge Exercise Prescription (Final Exercise Prescription Changes):  Exercise Prescription  Changes - 01/29/23 1200       Response to Exercise   Blood Pressure (Admit) 126/62    Blood Pressure (Exercise) 152/84    Blood Pressure (Exit) 120/70    Heart Rate (Admit) 71 bpm    Heart Rate (Exercise) 91 bpm    Heart Rate (Exit) 70 bpm    Rating of Perceived Exertion (Exercise) 12    Duration Continue with 30 min of aerobic exercise without signs/symptoms of physical distress.    Intensity THRR unchanged      Progression   Progression Continue to progress workloads to maintain intensity without signs/symptoms of physical distress.      Resistance Training   Training Prescription Yes    Weight 2    Reps 10-15      Treadmill   MPH 1.5    Grade 0    Minutes 15    METs 2.15      NuStep   Level 2    SPM 100    Minutes 15    METs 2.2      Oxygen   Maintain Oxygen Saturation 88% or higher             Nutrition:  Target Goals: Understanding of nutrition guidelines, daily intake of sodium 1500mg , cholesterol 200mg , calories 30% from fat and 7% or less from saturated fats, daily to have 5 or more servings of fruits and vegetables.  Biometrics:  Pre Biometrics - 01/09/23 0747       Pre Biometrics   Height 5\' 6"  (1.676 m)    Weight 221 lb 5.5 oz (100.4 kg)    Waist Circumference 47 inches    Hip Circumference 45 inches    Waist to Hip Ratio 1.04 %    BMI (Calculated) 35.74    Triceps Skinfold 25 mm    % Body Fat 26.4 %    Grip Strength 34.1 kg    Flexibility 0 in  Single Leg Stand 0 seconds              Nutrition Therapy Plan and Nutrition Goals:  Nutrition Therapy & Goals - 01/22/23 1042       Nutrition Therapy   RD appointment deferred Yes      Personal Nutrition Goals   Comments We provide educational sessions on heart healthy nutrition with handouts during the program.      Intervention Plan   Intervention Nutrition handout(s) given to patient.    Expected Outcomes Short Term Goal: Understand basic principles of dietary content, such  as calories, fat, sodium, cholesterol and nutrients.             Nutrition Assessments:  Nutrition Assessments - 01/03/23 1040       MEDFICTS Scores   Pre Score 26            MEDIFICTS Score Key: ?70 Need to make dietary changes  40-70 Heart Healthy Diet ? 40 Therapeutic Level Cholesterol Diet   Picture Your Plate Scores: <16 Unhealthy dietary pattern with much room for improvement. 41-50 Dietary pattern unlikely to meet recommendations for good health and room for improvement. 51-60 More healthful dietary pattern, with some room for improvement.  >60 Healthy dietary pattern, although there may be some specific behaviors that could be improved.    Nutrition Goals Re-Evaluation:   Nutrition Goals Discharge (Final Nutrition Goals Re-Evaluation):   Psychosocial: Target Goals: Acknowledge presence or absence of significant depression and/or stress, maximize coping skills, provide positive support system. Participant is able to verbalize types and ability to use techniques and skills needed for reducing stress and depression.  Initial Review & Psychosocial Screening:  Initial Psych Review & Screening - 01/03/23 1034       Initial Review   Current issues with Current Sleep Concerns      Family Dynamics   Good Support System? Yes    Comments The pt's wife and his 2 daughters are his support system.      Barriers   Psychosocial barriers to participate in program There are no identifiable barriers or psychosocial needs.      Screening Interventions   Interventions Encouraged to exercise;To provide support and resources with identified psychosocial needs;Provide feedback about the scores to participant    Expected Outcomes Short Term goal: Identification and review with participant of any Quality of Life or Depression concerns found by scoring the questionnaire.;Long Term goal: The participant improves quality of Life and PHQ9 Scores as seen by post scores and/or  verbalization of changes             Quality of Life Scores:  Scores of 19 and below usually indicate a poorer quality of life in these areas.  A difference of  2-3 points is a clinically meaningful difference.  A difference of 2-3 points in the total score of the Quality of Life Index has been associated with significant improvement in overall quality of life, self-image, physical symptoms, and general health in studies assessing change in quality of life.  PHQ-9: Review Flowsheet  More data exists      01/03/2023 08/08/2022 02/01/2022 12/29/2021 09/29/2021  Depression screen PHQ 2/9  Decreased Interest 3 0 0 0 0  Down, Depressed, Hopeless 0 1 0 1 0  PHQ - 2 Score 3 1 0 1 0  Altered sleeping 3 0 - - 0  Tired, decreased energy 3 3 - - 3  Change in appetite 2 2 - - 1  Feeling  bad or failure about yourself  0 0 - - 2  Trouble concentrating 0 0 - - 1  Moving slowly or fidgety/restless 0 0 - - 0  Suicidal thoughts 0 0 - - 0  PHQ-9 Score 11 6 - - 7  Difficult doing work/chores Somewhat difficult Somewhat difficult - - Somewhat difficult    Details           Interpretation of Total Score  Total Score Depression Severity:  1-4 = Minimal depression, 5-9 = Mild depression, 10-14 = Moderate depression, 15-19 = Moderately severe depression, 20-27 = Severe depression   Psychosocial Evaluation and Intervention:  Psychosocial Evaluation - 01/03/23 1132       Psychosocial Evaluation & Interventions   Interventions Stress management education;Relaxation education;Encouraged to exercise with the program and follow exercise prescription    Comments Pt has no identifiable psychosocial barriers to completing the CR program.  He scored an 11 on his PHQ-9 related to his fatigue, trouble staying asleep, and having little interest in doing things (because of his fatigue).  He denies having any anxiety/depression, and he does not want to be placed on any sleep aids.  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  The pt states he has had a poor appetite since his CABG which is potentially contributing to his fatigue, but he did not want to be referred to a dietician.  He continues to undergo peritoneal dialysis at home.  The pt has a good support system with his wife and 2 daughters.  His goal for the program is to improve his overall health, and we will continue to monitor his progress as he works toward meeting this goal.    Expected Outcomes Pt will continue to have no identifiable psychosocial barriers.    Continue Psychosocial Services  No Follow up required             Psychosocial Re-Evaluation:  Psychosocial Re-Evaluation     Row Name 01/22/23 1043             Psychosocial Re-Evaluation   Current issues with None Identified       Comments Patient is new to the program. He has completed 6 sessions. He continues to have no psychosocial barriers identified. He seems to enjoy the program and demonstrates an interest in improving his health. We will continue to montior his progress.       Expected Outcomes Patient will continue to have no psychosocial barriers or issues identified.       Interventions Encouraged to attend Cardiac Rehabilitation for the exercise;Stress management education;Relaxation education       Continue Psychosocial Services  No Follow up required                Psychosocial Discharge (Final Psychosocial Re-Evaluation):  Psychosocial Re-Evaluation - 01/22/23 1043       Psychosocial Re-Evaluation   Current issues with None Identified    Comments Patient is new to the program. He has completed 6 sessions. He continues to have no psychosocial barriers identified. He seems to enjoy the program and demonstrates an interest in improving his health. We will continue to montior his progress.    Expected Outcomes Patient will continue to have no psychosocial barriers or issues identified.     Interventions Encouraged to attend Cardiac Rehabilitation for the exercise;Stress management education;Relaxation education    Continue Psychosocial Services  No Follow up required  Vocational Rehabilitation: Provide vocational rehab assistance to qualifying candidates.   Vocational Rehab Evaluation & Intervention:  Vocational Rehab - 01/03/23 1048       Initial Vocational Rehab Evaluation & Intervention   Assessment shows need for Vocational Rehabilitation Yes      Vocational Rehab Re-Evaulation   Comments Pt is interested in talking to a counselor about to discuss vocational rehab services.             Education: Education Goals: Education classes will be provided on a weekly basis, covering required topics. Participant will state understanding/return demonstration of topics presented.  Learning Barriers/Preferences:  Learning Barriers/Preferences - 01/03/23 1043       Learning Barriers/Preferences   Learning Barriers None    Learning Preferences Verbal Instruction;Written Material             Education Topics: Hypertension, Hypertension Reduction -Define heart disease and high blood pressure. Discus how high blood pressure affects the body and ways to reduce high blood pressure. Flowsheet Row CARDIAC REHAB PHASE II EXERCISE from 02/07/2023 in [place] CARDIAC REHABILITATION  Date 01/17/23  Educator HB  Instruction Review Code 1- Verbalizes Understanding       Exercise and Your Heart -Discuss why it is important to exercise, the FITT principles of exercise, normal and abnormal responses to exercise, and how to exercise safely. Flowsheet Row CARDIAC REHAB PHASE II EXERCISE from 11/30/2021 in [place] CARDIAC REHABILIT[facility]  Date 10/26/21  Educator HJ  Instruction Review Code 1- Verbalizes Understanding       Angina -Discuss definition of angina, causes of angina, treatment of angina, and how to decrease risk of having angina. Flowsheet  Row CARDIAC REHAB PHASE II EXERCISE from 02/07/2023 in [place] CARDIAC REHABILITATION  Date 01/31/23  Educator HW  Instruction Review Code 1- Verbalizes Understanding       Cardiac Medications -Review what the following cardiac medications are used for, how they affect the body, and side effects that may occur when taking the medications.  Medications include Aspirin, Beta blockers, calcium channel blockers, ACE Inhibitors, angiotensin receptor blockers, diuretics, digoxin, and antihyperlipidemics. Flowsheet Row CARDIAC REHAB PHASE II EXERCISE from 02/07/2023 in [place] CARDIAC REHABILITATION  Date 02/07/23  Educator HB  Instruction Review Code 1- Verbalizes Understanding       Congestive Heart Failure -Discuss the definition of CHF, how to live with CHF, the signs and symptoms of CHF, and how keep track of weight and sodium intake. Flowsheet Row CARDIAC REHAB PHASE II EXERCISE from 11/30/2021 in [place] CARDIAC REHABILITATION  Date 11/16/21  Educator hj  Instruction Review Code 2- Demonstrated Understanding       Heart Disease and Intimacy -Discus the effect sexual activity has on the heart, how changes occur during intimacy as we age, and safety during sexual activity. Flowsheet Row CARDIAC REHAB PHASE II EXERCISE from 11/30/2021 in [place] CARDIAC REHABILITATION  Date 11/23/21  Educator pb  Instruction Review Code 1- Verbalizes Understanding       Smoking Cessation / COPD -Discuss different methods to quit smoking, the health benefits of quitting smoking, and the definition of COPD. Flowsheet Row CARDIAC REHAB PHASE II EXERCISE from 09/13/2022 in [place] CARDIAC REHABILITATION  Date 09/06/22  Educator HB  Instruction Review Code 1- Verbalizes Understanding       Nutrition I: Fats -Discuss the types of cholesterol, what cholesterol does to the heart, and how cholesterol levels can be controlled. Flowsheet Row CARDIAC REHAB PHASE II EXERCISE from 09/13/2022  in [place] [facility]  Date 09/13/22  Educator HB  Instruction Review Code 2- Demonstrated Understanding       Nutrition II: Labels -Discuss the different components of food labels and how to read food label   Heart Parts/Heart Disease and PAD -Discuss the anatomy of the heart, the pathway of blood circulation through the heart, and these are affected by heart disease.   Stress I: Signs and Symptoms -Discuss the causes of stress, how stress may lead to anxiety and depression, and ways to limit stress.   Stress II: Relaxation -Discuss different types of relaxation techniques to limit stress. Flowsheet Row CARDIAC REHAB PHASE II EXERCISE from 10/05/2021 in [place] CARDIAC REHABILITATION  Date 10/05/21  Educator pb  Instruction Review Code 1- Verbalizes Understanding       Warning Signs of Stroke / TIA -Discuss definition of a stroke, what the signs and symptoms are of a stroke, and how to identify when someone is having stroke. Flowsheet Row CARDIAC REHAB PHASE II EXERCISE from 01/10/2023 in [place] CARDIAC REHABILITATION  Date 01/10/23  Educator HB       Knowledge Questionnaire Score:  Knowledge Questionnaire Score - 01/03/23 1044       Knowledge Questionnaire Score   Pre Score 20/24             Core Components/Risk Factors/Patient Goals at Admission:  Personal Goals and Risk Factors at Admission - 01/03/23 1051       Core Components/Risk Factors/Patient Goals on Admission    Weight Management Yes;Obesity    Intervention Obesity: Provide education and appropriate resources to help participant work on and attain dietary goals.    Expected Outcomes Short Term: Continue to assess and modify interventions until short term weight is achieved;Long Term: Adherence to nutrition and physical activity/exercise program aimed toward attainment of established weight goal;Weight Loss: Understanding of general recommendations for a balanced deficit meal  plan, which promotes 1-2 lb weight loss per week and includes a negative energy balance of [phone] kcal/d;Understanding of distribution of calorie intake throughout the day with the consumption of 4-5 meals/snacks;Understanding recommendations for meals to include 15-35% energy as protein, 25-35% energy from fat, 35-60% energy from carbohydrates, less than 200mg  of dietary cholesterol, 20-35 gm of total fiber daily    Improve shortness of breath with ADL's Yes    Intervention Provide education, individualized exercise plan and daily activity instruction to help decrease symptoms of SOB with activities of daily living.    Expected Outcomes Short Term: Improve cardiorespiratory fitness to achieve a reduction of symptoms when performing ADLs;Long Term: Be able to perform more ADLs without symptoms or delay the onset of symptoms    Diabetes Yes    Intervention Provide education about signs/symptoms and action to take for hypo/hyperglycemia.;Provide education about proper nutrition, including hydration, and aerobic/resistive exercise prescription along with prescribed medications to achieve blood glucose in normal ranges: Fasting glucose 65-99 mg/dL    Expected Outcomes Short Term: Participant verbalizes understanding of the signs/symptoms and immediate care of hyper/hypoglycemia, proper foot care and importance of medication, aerobic/resistive exercise and nutrition plan for blood glucose control.;Long Term: Attainment of HbA1C < 7%.    Hypertension Yes    Intervention Provide education on lifestyle modifcations including regular physical activity/exercise, weight management, moderate sodium restriction and increased consumption of fresh fruit, vegetables, and low fat dairy, alcohol moderation, and smoking cessation.;Monitor prescription use compliance.    Expected Outcomes Short Term: Continued assessment and intervention until BP is <  140/48mm HG in hypertensive participants. < 130/47mm HG in hypertensive  participants with diabetes, heart failure or chronic kidney disease.;Long Term: Maintenance of blood pressure at goal levels.    Lipids Yes    Intervention Provide education and support for participant on nutrition & aerobic/resistive exercise along with prescribed medications to achieve LDL 70mg , HDL >40mg .    Expected Outcomes Short Term: Participant states understanding of desired cholesterol values and is compliant with medications prescribed. Participant is following exercise prescription and nutrition guidelines.;Long Term: Cholesterol controlled with medications as prescribed, with individualized exercise RX and with personalized nutrition plan. Value goals: LDL < 70mg , HDL > 40 mg.    Personal Goal Other Yes    Personal Goal Pt wants to improve his overall health.    Intervention Pt will attend CR program 3 times a week and also start a home exercise program.    Expected Outcomes Pt will complete the program meeting both his personal and program goals.             Core Components/Risk Factors/Patient Goals Review:   Goals and Risk Factor Review     Row Name 01/22/23 1044             Core Components/Risk Factors/Patient Goals Review   Personal Goals Review Weight Management/Obesity;Diabetes;Improve shortness of breath with ADL's;Other;Hypertension;Lipids       Review Patient was referred to CR with [facility]. He has multiple risk factors for CAD and is participating in the program for risk modification. He has completed 6 sessions. His current weight is 225.4 lbs maintained from his orientation visit. Patient has participated in the program twice. His blood pressure is labile. He is being evaluated by PT for chronic right shoulder pain. His last A1C was 5.9% 05/30/22. His DM is managed with Insulin. His personal goals for the program is to improve his overall health. We will continue to montior his progress as he works towards meeting these goals.       Expected Outcomes Patient will  complete the program meeting both program and personal goals.                Core Components/Risk Factors/Patient Goals at Discharge (Final Review):   Goals and Risk Factor Review - 01/22/23 1044       Core Components/Risk Factors/Patient Goals Review   Personal Goals Review Weight Management/Obesity;Diabetes;Improve shortness of breath with ADL's;Other;Hypertension;Lipids    Review Patient was referred to CR with CABGx2. He has multiple risk factors for CAD and is participating in the program for risk modification. He has completed 6 sessions. His current weight is 225.4 lbs maintained from his orientation visit. Patient has participated in the program twice. His blood pressure is labile. He is being evaluated by PT for chronic right shoulder pain. His last A1C was 5.9% 05/30/22. His DM is managed with Insulin. His personal goals for the program is to improve his overall health. We will continue to montior his progress as he works towards meeting these goals.    Expected Outcomes Patient will complete the program meeting both program and personal goals.             ITP Comments:  ITP Comments     Row Name 01/31/23 1305 02/14/23 0738 02/21/23 0746 02/28/23 0816     ITP Comments 30 day review completed. ITP sent to Dr. Dina Rich, Medical Director of Cardiac Rehab. Continue with ITP unless changes are made by physician. Pt has tested positive for COVID on 02/07/23  return date is 10 days from positive test [place] has been admitted to hospital since 02/14/23 for pnuemonia and altered mental status/disorientation. 30 day review completed. ITP sent to Dr. Dina Rich, Medical Director of Cardiac Rehab. Continue with ITP unless changes are made by physician.             Comments: 30 day review

## 2023-02-28 NOTE — Progress Notes (Signed)
Physical Therapy Treatment Patient Details Name: [name] [name] MRN: [id number] DOB: March 04, 1951 Today's Date: 02/28/2023   History of Present Illness 72 y/o male admitted 7/31 for confusion and progressive SOB, pt with PNA and transaminitis.8/3 PD catheter malfunction. 8/5 Peritoneal dialysis catheter removed. PMHx:  CABG, COPD, IDT2DM, ESRD on PD, CAD, Rt TKA, HTN, gout    PT Comments  Pt greeted resting in bed and eager for OOB mobility with continued progress towards acute goals. Pt able to come to sitting EOB with CGA for safety with increased time and effort however pt without need for physical assist to complete. Pt able to verbalize and demonstrate good recall for hand placement on rise to stand to RW and performing gait in room to target of chair with CGA for safety and RW for support. Pt continues to require cues for RW proximity and increased bil LE clearance during gait however pt without LOB. Pt continues to fatigue quickly, declining second gait bout and further exercise due to fatigue despite encouragement. Current plan remains appropriate to address deficits and maximize functional independence and decrease caregiver burden. Pt continues to benefit from skilled PT services to progress toward functional mobility goals.      If plan is discharge home, recommend the following: A lot of help with walking and/or transfers;A lot of help with bathing/dressing/bathroom;Assistance with cooking/housework;Assistance with feeding;Direct supervision/assist for financial management;Direct supervision/assist for medications management   Can travel by private vehicle     No  Equipment Recommendations  BSC/3in1;Wheelchair (measurements PT)    Recommendations for Other Services       Precautions / Restrictions Precautions Precautions: Fall;Other (comment) Precaution Comments: watch sats Restrictions Weight Bearing Restrictions: No     Mobility  Bed Mobility Overal bed mobility: Needs  Assistance Bed Mobility: Supine to Sit     Supine to sit: HOB elevated, Contact guard, Used rails     General bed mobility comments: increased time and +bed rail use, no physical asssit needed    Transfers Overall transfer level: Needs assistance Equipment used: Rolling walker (2 wheels) Transfers: Sit to/from Stand, Bed to chair/wheelchair/BSC Sit to Stand: Contact guard assist           General transfer comment: pt able to recall verbally and demonstrate good recall for hand placement on rise    Ambulation/Gait Ambulation/Gait assistance: Contact guard assist Gait Distance (Feet): 20 Feet Assistive device: Rolling walker (2 wheels) Gait Pattern/deviations: Decreased step length - right, Decreased step length - left, Decreased stride length, Shuffle, Step-through pattern Gait velocity: decr     General Gait Details: steady gait with RW support, cues for RW proximity and increased LE clearance, distance limited to pt stated tolerance as pt endorsing LE fatigue depiste encouragement for second bout, no overt LOB noted   Stairs             Wheelchair Mobility     Tilt Bed    Modified Rankin (Stroke Patients Only)       Balance Overall balance assessment: Needs assistance Sitting-balance support: No upper extremity supported, Feet supported Sitting balance-Leahy Scale: Fair     Standing balance support: Bilateral upper extremity supported, During functional activity, Reliant on assistive device for balance Standing balance-Leahy Scale: Poor Standing balance comment: RW in standing                            Cognition Arousal: Alert Behavior During Therapy: WFL for tasks assessed/performed (much more  conversational today) Overall Cognitive Status: Impaired/Different from baseline Area of Impairment: Attention, Memory, Following commands, Safety/judgement, Awareness, Problem solving                 Orientation Level: Disoriented to,  Time (Able to recall year, not month) Current Attention Level: Focused, Sustained (sustained for short periods) Memory: Decreased short-term memory Following Commands: Follows one step commands inconsistently, Follows one step commands with increased time Safety/Judgement: Decreased awareness of deficits, Decreased awareness of safety Awareness: Intellectual, Emergent (aware of fatigue and asking for rest breaks.) Problem Solving: Slow processing, Requires verbal cues, Requires tactile cues, Decreased initiation General Comments: eager to be up OOB        Exercises      General Comments General comments (skin integrity, edema, etc.): VSS on supplemental O2      Pertinent Vitals/Pain Pain Assessment Pain Assessment: No/denies pain    Home Living                          Prior Function            PT Goals (current goals can now be found in the care plan section) Acute Rehab PT Goals Patient Stated Goal: to get to chair and have some milk PT Goal Formulation: With patient Time For Goal Achievement: 02/23/23 Progress towards PT goals: Progressing toward goals    Frequency    Min 1X/week      PT Plan      Co-evaluation              AM-PAC PT "6 Clicks" Mobility   Outcome Measure  Help needed turning from your back to your side while in a flat bed without using bedrails?: A Lot Help needed moving from lying on your back to sitting on the side of a flat bed without using bedrails?: A Lot Help needed moving to and from a bed to a chair (including a wheelchair)?: A Little Help needed standing up from a chair using your arms (e.g., wheelchair or bedside chair)?: A Little Help needed to walk in hospital room?: A Little Help needed climbing 3-5 steps with a railing? : Total 6 Click Score: 14    End of Session Equipment Utilized During Treatment: Gait belt;Oxygen Activity Tolerance: Patient tolerated treatment well Patient left: in chair;with call  bell/phone within reach;with chair alarm set Nurse Communication: Mobility status PT Visit Diagnosis: Muscle weakness (generalized) (M62.81);Unsteadiness on feet (R26.81)     Time: 1610-9604 PT Time Calculation (min) (ACUTE ONLY): 20 min  Charges:    $Gait Training: 8-22 mins PT General Charges $$ ACUTE PT VISIT: 1 Visit                      R. PTA Acute Rehabilitation Services Office: [phone]   Catalina Antigua 02/28/2023, 12:54 PM

## 2023-02-28 NOTE — Care Management Important Message (Signed)
Important Message  Patient Details  Name: ARVAL MCGUINESS MRN: [id number] Date of Birth: 04-03-1951   Medicare Important Message Given:  Yes      Stefan Church 02/28/2023, 3:33 PM

## 2023-02-28 NOTE — Progress Notes (Signed)
OT Cancellation Note  Patient Details Name: [name] [name] MRN: [id number] DOB: Oct 29, 1950   Cancelled Treatment:    Reason Eval/Treat Not Completed: Patient at procedure or test/ unavailable (Pt at cath lab. will return as schedule allows.)  [name], OTR/L [facility] Acute Rehabilitation Office: [phone]  [name] 02/28/2023, 11:13 AM

## 2023-02-28 NOTE — Progress Notes (Signed)
[facility] Progress Note   Subjective:    Seen and examined patient at bedside. Tolerated yesterday's HD with net UF 2.7L. S/p F'gram by Dr. Karin Lieu today. F'gram showed AVF widely patent, thus, TDC was not placed. Plan to try cannulating using AVF again during tomorrow's HD. If we run into problems, will back out to [facility] for the [facility] placement.  Objective Vitals:   02/28/23 1127 02/28/23 1128 02/28/23 1133 02/28/23 1151  BP: (!) 144/94 (!) 144/94 (!) 109/90 (!) 132/96  Pulse: 81 80 81 83  Resp: (!) 30 (!) 22 (!) 26 16  Temp:    97.9 F (36.6 C)  TempSrc:    Oral  SpO2: 94% 91% 92% 95%  Weight:      Height:       Physical Exam General: Awake and alert Heart: Normal rate, no rub Lungs: Chest rise no increased work of breathing Abdomen: soft, obese, PD cath has been removed Extremities: No pedal edema, warm and well-perfused LUA AVF +positive thrill  temp HD cath in place Neuro: as above  [facility]   02/26/23 0334 02/27/23 0827 02/27/23 1231  Weight: 95 kg 95.2 kg 92.5 kg    Intake/Output Summary (Last 24 hours) at 02/28/2023 1450 Last data filed at 02/28/2023 1200 Gross per 24 hour  Intake 540.44 ml  Output 0 ml  Net 540.44 ml    Additional Objective Labs: Basic Metabolic Panel: Recent Labs  Lab 02/23/23 0352 02/24/23 0333 02/24/23 1326 02/25/23 0537 02/28/23 0129  NA 137 133* 135 135 134*  K 3.9 3.5 3.8 3.3* 3.9  CL 95* 95*  --  97* 95*  CO2 24 26  --  26 27  GLUCOSE 184* 147*  --  126* 156*  BUN 61* 26*  --  12 26*  CREATININE 12.36* 6.82*  --  3.48* 6.49*  CALCIUM 7.9* 7.7*  --  7.5* 8.6*  PHOS 6.2* 3.4  --  2.5  --    Liver Function Tests: Recent Labs  Lab 02/23/23 0352 02/24/23 0333 02/25/23 0537  ALBUMIN <1.5* <1.5* <1.5*   No results for input(s): "LIPASE", "AMYLASE" in the last 168 hours. CBC: Recent Labs  Lab 02/22/23 0023 02/23/23 0352 02/24/23 0333 02/24/23 1326 02/25/23 0537 02/28/23 0129  WBC 11.9* 9.4 10.2   --  9.5 6.3  HGB 8.9* 8.6* 8.3* 9.2* 9.0* 8.6*  HCT 27.9* 26.8* 26.2* 27.0* 28.3* 28.3*  MCV 85.1 87.3 85.3  --  87.1 89.8  PLT 133* 106* 126*  --  157 184   Blood Culture    Component Value Date/Time   SDES BLOOD RIGHT HAND 02/19/2023 1855   SPECREQUEST  02/19/2023 1855    BOTTLES DRAWN AEROBIC ONLY Blood Culture results may not be optimal due to an inadequate volume of blood received in culture bottles   CULT  02/19/2023 1855    NO GROWTH 5 DAYS Performed at [facility], [address], [place], [place] [address]    REPTSTATUS 02/24/2023 FINAL 02/19/2023 1855    Cardiac Enzymes: No results for input(s): "CKTOTAL", "CKMB", "CKMBINDEX", "TROPONINI" in the last 168 hours. CBG: Recent Labs  Lab 02/27/23 1224 02/27/23 1841 02/27/23 2252 02/28/23 0827 02/28/23 1154  GLUCAP 155* 163* 171* 180* 146*   Iron Studies: No results for input(s): "IRON", "TIBC", "TRANSFERRIN", "FERRITIN" in the last 72 hours. Lab Results  Component Value Date   INR 1.2 02/18/2023   INR 1.2 02/17/2023   INR 1.3 (H) 02/16/2023   Studies/Results:  PERIPHERAL VASCULAR CATHETERIZATION  Result Date: 02/28/2023 Images from the original result were not included. Patient name: [name] [name] MRN: [id number] DOB: 1950/08/10 Sex: male 02/28/2023 Pre-operative Diagnosis: Left upper extremity fistula function Post-operative diagnosis:  Same Surgeon:  Victorino Sparrow, MD Procedure Performed: 1.  Ultrasound-guided micropuncture access of the left brachiocephalic fistula 2.  Fistulogram 3.  Balloon venoplasty 6 x 80, 8 x 60, 7 x 40 4.  Drug-coated balloon venoplasty 7 x 40 mm x 2 Indications: Patient is a 72 year old male with end-stage renal disease requiring dialysis.  Last week, he had his peritoneal dialysis catheter removed due to infection.  Previous history of left-sided brachiocephalic fistula, that was working but has not been used in quite some time due to his peritoneal dialysis.  At last attempt, the  fistula did not work appropriately.  After discussing the risks and benefits of left-sided fistulogram with possible intervention, as well as tunneled dialysis catheter placement, [name] [name] elected to proceed. Findings: Widely patent fistula anastomosis.  3 lesions along the fistula with greater than 60% stenosis, 2 in the upper arm, 1 at the axillary vein confluence. No central stenosis  Procedure:  The patient was identified in the holding area and taken to room 8.  The patient was then placed supine on the table and prepped and draped in the usual sterile fashion.  A time out was called.  Ultrasound was used to evaluate the left arteriovenous fistula.  An ultrasound-guided micropuncture needle was used to access the fistula in antegrade fashion.  Fistulogram followed.  See results above.  I elected to intervene on the 3 lesions.  The patient was heparinized, a 6 French sheath was placed.  A 6 x 80 mm balloon was used on all 3 lesions, followed by a 7 mm and 8 mm balloon.  There was continued wasting with the 8 mm balloon, therefore I downsized to a 7 x 40 mm balloon with wasting resolving at the axillary vein confluence with an inflation to 20 atm. Wasting at the mid arm and 2 locations improved dramatically to less than 30%.  I elected to use a 7 x 40 mm drug-coated balloon to help prevent further stenosis.  The initial balloon was inflated at the axillary vein confluence with an excellent result.  Unfortunately, [name] [name] tolerated the procedure rather poorly and stated that he was done and he wanted to stop the procedure immediately.  Therefore I did not use drug-coated balloon venoplasty on the other 2 upper arm lesions.  Follow-up venography demonstrated excellent result.  There is some tortuosity, but no flow-limiting stenosis appreciated.  [name] [name] stated he did not want to move forward with tunneled dialysis catheter placement. I respected his wishes and terminated the case. The left-sided brachiocephalic fistula can  be accessed.  Should there continue to be difficulties, he would need open revision to decrease some of the tortuosity. Should the fistula malfunction, I am happy to place a tunneled dialysis catheter tomorrow afternoon in the operating room where [name] [name] would tolerate it. Impression: Successful balloon angioplasty of left-sided brachiocephalic fistula.  The fistula can be accessed. [name] Olden, MD [facility] Office: [phone]   DG CHEST PORT 1 VIEW  Result Date: 02/28/2023 CLINICAL DATA:  Hypoxia. EXAM: PORTABLE CHEST 1 VIEW COMPARISON:  02/23/2023 FINDINGS: Patchy mid and lower lung airspace disease again noted, similar to prior given differing technique. Lung volumes remain low. Cardiopericardial silhouette is at upper limits of normal for size. Right IJ catheter  tip overlies the proximal SVC level. Telemetry leads overlie the chest. IMPRESSION: Low volume film with patchy mid and lower lung airspace disease, similar to prior. Electronically Signed   By: Kennith Center M.D.   On: 02/28/2023 07:59    Medications:  sodium chloride Stopped (02/22/23 1238)   ampicillin-sulbactam (UNASYN) IV 3 g (02/28/23 1200)   micafungin (MYCAMINE) 200 mg in sodium chloride 0.9 % 100 mL IVPB Stopped (02/28/23 0416)    aspirin EC  81 mg Oral Daily   Chlorhexidine Gluconate Cloth  6 each Topical Daily   ciprofloxacin  500 mg Oral q1800   clopidogrel  75 mg Oral Daily   darbepoetin (ARANESP) injection - DIALYSIS  60 mcg Subcutaneous Q Fri-1800   feeding supplement  237 mL Oral BID BM   Gerhardt's butt cream   Topical QID   heparin  5,000 Units Subcutaneous Q8H   insulin aspart  0-5 Units Subcutaneous QHS   insulin aspart  0-6 Units Subcutaneous TID WC   midodrine  10 mg Oral TID WC   mometasone-formoterol  2 puff Inhalation BID   pantoprazole  40 mg Oral BID   sodium chloride flush  3 mL Intravenous Q12H    Dialysis Orders: Outpatient PD orders:  Followed by [facility] (note his PD RN was on vacation  but nephrology called [place] location for orders   [facility] - davita Maitland [facility] - 100kg 5 cycles, 3000 fill vol (note pt states on consult he thinks is 2.5 liters), total time: 10 hrs, last fill 1500 cc Mircera 150 mcg q monthly Venofer 100mg  once a month L AVF +thrill but small + tortuous     CXR 8/05 - IMPRESSION: Persistent bibasilar opacities, which may represent atelectasis or pneumonia.  Home med --> albuterol, xanax, aspirin, aotrvastatin, clopidogrel, esomeprazole, gabapentin, insulin glargine, linagliptin, midodrine    Assessment/Plan: Peritonitis PD catheter related peritonitis -also with blood cultures positive for fungus.  Followed by ID on antibiotics as well as micafungin.  PD catheter removed on 8/5.  Per ccm / ID.  Vol overload/ pulm edema -had diet assist on Fri and Sat.  Ongoing fluid removal with dialysis as tolerated.  Marland Kitchen   ESRD - pt rec'd PD initially, then PD cath pulled due to infection. Had HD 3x this past week, last one on Sat. TTS schedule this week-next HD 8/15. Return to MWF schedule next week HD access - PD not working 8/2 --> IR placed temp HD catheter and underwent HD 8/3.  PD cath removed 8/05 due to fungal sepsis. Had iHD on 8/06 via temp cath then it was dc'd for line holiday. L arm AVF was not usable when we tried on 8/08 (infiltrated, tortuous, too deep?). Temp cath replaced 8/09 by CCM, line holiday completed and cultures were clear on 8/5. Discussed with ID today who is okay with permanent catheter placement. S/p F'gram today: AVF is widely patent, thus, TDC was not placed, will try using the AVF at HD tomorrow. If we run into problems, will reach back out to [facility] for them to place a TDC. We appreciate their help Hypotension - chronic issue on home midodrine 5 tid. Here ^'d to 10mg  tid + 10mg  pre HD. Continue until we can get him to a new dry wt then can back off.  Anemia esrd - Hb 9's, started darbe  60 mcg weekly while here.  Secondary hyperparathyroidism -corrected calcium normal, phos low, stop binder.  [name], NP [facility]  02/28/2023,2:50 PM  LOS: 13 days

## 2023-02-28 NOTE — Progress Notes (Addendum)
  Progress Note    02/28/2023 8:34 AM 9 Days Post-Op  Subjective: No overnight issues, less confused today  Vitals:   02/28/23 0527 02/28/23 0723  BP: 132/73   Pulse: (!) 101   Resp: 16   Temp:    SpO2: 95% 100%    Physical Exam: Awake alert and answers questions appropriately Pulsatility and left upper arm fistula Nontunneled catheter in right IJ without evidence of infection  CBC    Component Value Date/Time   WBC 6.3 02/28/2023 0129   RBC 3.15 (L) 02/28/2023 0129   HGB 8.6 (L) 02/28/2023 0129   HGB 11.3 (L) 12/22/2021 1017   HCT 28.3 (L) 02/28/2023 0129   HCT 33.7 (L) 12/22/2021 1017   PLT 184 02/28/2023 0129   PLT 117 (L) 12/22/2021 1017   MCV 89.8 02/28/2023 0129   MCV 91 12/22/2021 1017   MCH 27.3 02/28/2023 0129   MCHC 30.4 02/28/2023 0129   RDW 20.3 (H) 02/28/2023 0129   RDW 13.7 12/22/2021 1017   LYMPHSABS 0.1 (L) 02/19/2023 0458   MONOABS 0.1 02/19/2023 0458   EOSABS 0.0 02/19/2023 0458   BASOSABS 0.0 02/19/2023 0458    BMET    Component Value Date/Time   NA 134 (L) 02/28/2023 0129   NA 136 12/22/2021 1017   K 3.9 02/28/2023 0129   CL 95 (L) 02/28/2023 0129   CO2 27 02/28/2023 0129   GLUCOSE 156 (H) 02/28/2023 0129   BUN 26 (H) 02/28/2023 0129   BUN 58 (H) 12/22/2021 1017   CREATININE 6.49 (H) 02/28/2023 0129   CREATININE 18.89 (H) 02/09/2020 1126   CALCIUM 8.6 (L) 02/28/2023 0129   GFRNONAA 8 (L) 02/28/2023 0129   GFRNONAA 2 (L) 02/09/2020 1126   GFRAA <4 (L) 02/09/2020 1126    INR    Component Value Date/Time   INR 1.2 02/18/2023 0425     Intake/Output Summary (Last 24 hours) at 02/28/2023 0834 Last data filed at 02/28/2023 0704 Gross per 24 hour  Intake 300.44 ml  Output 16336.67 ml  Net -16036.23 ml     Assessment/plan:  72 y.o. male is here with infected peritoneal dialysis catheter which has now been removed.  He has a fistula in the left upper arm which has been attempted to be used but was infiltrated.  This is from 2018  and was really never used at that time either as he had tunneled dialysis catheter to bridge to peritoneal dialysis.  He is not likely a candidate for peritoneal dialysis again in the future as such we will place [facility] today so he can have outpatient hemodialysis and also will perform fistulogram and attempts to get the fistula working for long-term access.  I have discussed this with [name] his wife via telephone as well as the patient he seems to doing very good understanding this morning   Brandon C. Randie Heinz, MD [facility] Office: [phone] Pager: [phone]  02/28/2023 8:34 AM  VASCULAR STAFF ADDENDUM: I have independently interviewed and examined the patient. I agree with the above.  Pt comfortable moving forward with left arm brachiocephalic fistulagram, and tunneled dialysis catheter placement.  Fara Olden, MD [facility] Phone Number: [phone] 02/28/2023 12:18 PM

## 2023-02-28 NOTE — Op Note (Addendum)
    Patient name: [name] [name] MRN: [id number] DOB: [birth year] Sex: male  02/28/2023 Pre-operative Diagnosis: Left upper extremity fistula function Post-operative diagnosis:  Same Surgeon:  Victorino Sparrow, MD Procedure Performed: 1.  Ultrasound-guided micropuncture access of the left brachiocephalic fistula 2.  Fistulogram 3.  Balloon venoplasty 6 x 80, 8 x 60, 7 x 40mm 4.  Drug-coated balloon venoplasty 7 x 40 mm     Indications: Patient is a 72 year old male with end-stage renal disease requiring dialysis.  Last week, he had his peritoneal dialysis catheter removed due to infection.  Previous history of left-sided brachiocephalic fistula, that was working but has not been used in quite some time due to his peritoneal dialysis.  At last attempt, the fistula did not work appropriately.  After discussing the risks and benefits of left-sided fistulogram with possible intervention, as well as tunneled dialysis catheter placement, [name] [name] elected to proceed.  Findings:  Widely patent fistula anastomosis.  3 lesions along the fistula with greater than 60% stenosis, 2 in the upper arm, 1 at the axillary vein confluence. No central stenosis   Procedure:  The patient was identified in the holding area and taken to room 8.  The patient was then placed supine on the table and prepped and draped in the usual sterile fashion.  A time out was called.  Ultrasound was used to evaluate the left arteriovenous fistula.  An ultrasound-guided micropuncture needle was used to access the fistula in antegrade fashion.  Fistulogram followed.  See results above.  I elected to intervene on the 3 lesions.  The patient was heparinized, a 6 French sheath was placed.  A 6 x 80 mm balloon was used on all 3 lesions, followed by a 7 mm and 8 mm balloon.  There was continued wasting with the 8 mm balloon, therefore I downsized to a 7 x 40 mm balloon with wasting resolving at the axillary vein confluence with an inflation to 20  atm. Wasting at the mid arm and 2 locations improved dramatically to less than 30%.  I elected to use a 7 x 40 mm drug-coated balloon to help prevent further stenosis.  The initial balloon was inflated at the axillary vein confluence with an excellent result.  Unfortunately, [name] [name] tolerated the procedure rather poorly and stated that he was done and he wanted to stop the procedure immediately.  Therefore I did not use drug-coated balloon venoplasty on the other 2 upper arm lesions.  Follow-up venography demonstrated excellent result.  There is some tortuosity, but no flow-limiting stenosis appreciated.    [name] [name] stated he did not want to move forward with tunneled dialysis catheter placement. I respected his wishes and terminated the case.  The left-sided brachiocephalic fistula can be accessed.  Should there continue to be difficulties, he would need open revision to decrease some of the tortuosity.  Should the fistula malfunction, I am happy to place a tunneled dialysis catheter tomorrow afternoon in the operating room where [name] [name] would tolerate it.   Impression: Successful balloon angioplasty of left-sided brachiocephalic fistula.  The fistula can be accessed.   [name] Olden, MD [facility] [place] Office: [phone]

## 2023-02-28 NOTE — Plan of Care (Signed)
Pt has been awake most of the night. Yelling out frequently for different people. Pt is confused to time, place and situation. Pt arguing with staff when trying to reorient him. Pt pulling oxygen off several times an hour. NPO after MN for procedure. Pt refused temp at 0400 d/t [place] not giving him any water. Had 2 BM's, soft, brown. Haldol given.    Problem: Education: Goal: Knowledge of disease or condition will improve Outcome: Not Progressing Goal: Knowledge of the prescribed therapeutic regimen will improve Outcome: Not Progressing Goal: Individualized Educational Video(s) Outcome: Not Progressing   Problem: Activity: Goal: Ability to tolerate increased activity will improve Outcome: Not Progressing   Problem: Respiratory: Goal: Ability to maintain a clear airway will improve Outcome: Not Progressing Goal: Levels of oxygenation will improve Outcome: Not Progressing Goal: Ability to maintain adequate ventilation will improve Outcome: Not Progressing   Problem: Fluid Volume: Goal: Ability to maintain a balanced intake and output will improve Outcome: Not Progressing   Problem: Metabolic: Goal: Ability to maintain appropriate glucose levels will improve Outcome: Not Progressing   Problem: Nutritional: Goal: Maintenance of adequate nutrition will improve Outcome: Not Progressing Goal: Progress toward achieving an optimal weight will improve Outcome: Not Progressing   Problem: Skin Integrity: Goal: Risk for impaired skin integrity will decrease Outcome: Not Progressing   Problem: Tissue Perfusion: Goal: Adequacy of tissue perfusion will improve Outcome: Not Progressing   Problem: Education: Goal: Knowledge of General Education information will improve Description: Including pain rating scale, medication(s)/side effects and non-pharmacologic comfort measures Outcome: Not Progressing   Problem: Health Behavior/Discharge Planning: Goal: Ability to manage health-related  needs will improve Outcome: Not Progressing   Problem: Clinical Measurements: Goal: Ability to maintain clinical measurements within normal limits will improve Outcome: Not Progressing Goal: Will remain free from infection Outcome: Not Progressing Goal: Diagnostic test results will improve Outcome: Not Progressing Goal: Respiratory complications will improve Outcome: Not Progressing Goal: Cardiovascular complication will be avoided Outcome: Not Progressing   Problem: Activity: Goal: Risk for activity intolerance will decrease Outcome: Not Progressing   Problem: Nutrition: Goal: Adequate nutrition will be maintained Outcome: Not Progressing   Problem: Coping: Goal: Level of anxiety will decrease Outcome: Not Progressing   Problem: Elimination: Goal: Will not experience complications related to bowel motility Outcome: Not Progressing Goal: Will not experience complications related to urinary retention Outcome: Not Progressing   Problem: Pain Managment: Goal: General experience of comfort will improve Outcome: Not Progressing   Problem: Safety: Goal: Ability to remain free from injury will improve Outcome: Not Progressing   Problem: Skin Integrity: Goal: Risk for impaired skin integrity will decrease Outcome: Not Progressing   Problem: Safety: Goal: Non-violent Restraint(s) Outcome: Not Progressing

## 2023-02-28 NOTE — Progress Notes (Signed)
Patient tolerated the procedure poorly.  Tunneled dialysis catheter not placed.  Fistula widely patent.  Recommend attempting access again.  Should the fistula continue to malfunction, will place tunneled dialysis catheter tomorrow.

## 2023-02-28 NOTE — Progress Notes (Signed)
[name] CHRISTE  [name] DOB: [birth year] DOA: 02/14/2023 PCP: Benetta Spar, MD    Brief Narrative:  72 year old with a history of CABG November 2023, COPD, DM2, GERD, chronic hypotension on midodrine, and ESRD on PD who was brought to the [facility] ER by his wife 02/15/2023 with significant confusion and progressive shortness of breath over a few days.  He was found to have sepsis secondary to peritoneal dialysis associated catheter peritonitis and polymicrobial bacteremia/fungemia.  Hospital course complicated by worsening encephalopathy with hypoxemia requiring transfer to the ICU.  See below for further details.  Significant Events: 7/31-8/1>> admit via [facility] ED for AMS - transferred to [facility]  8/3>> malfunction of PD catheter - R IJ temporary HD cath placed with HD performed 8/4>>decline in status post HD prompting PCCM consultation 8/5>> blood cultures positive for Candida glabrata and staph species 8/5>> PD catheter removed in OR 8/7>> encephalopathic/severe abdominal pain-not keeping his oxygen on-ICU consult as patient on 15 L of oxygen.   8/8>> line holiday. unable to cannulate AVF for HD. 8/9>> temp HD line placed.  8/13>> TRH assumed care 8/14>> fistulogram-TDC not placed.  Goals of Care:   Code Status: Full Code   DVT prophylaxis: heparin injection 5,000 Units Start: 02/15/23 1745  Subjective:  Awake alert-no major issues overnight.  Objective: Blood pressure (!) 132/96, pulse 83, temperature 97.9 F (36.6 C), temperature source Oral, resp. rate 16, height 5\' 6"  (1.676 m), weight 92.5 kg, SpO2 95%.   Gen Exam:Alert awake-not in any distress HEENT:atraumatic, normocephalic Chest: B/L clear to auscultation anteriorly CVS:S1S2 regular Abdomen:soft non tender, non distended Extremities:+ edema Neurology: Non focal  Assessment & Plan: Acute metabolic encephalopathy Secondary to uremia/peritonitis Significantly better-answering simple questions appropriately this  morning.    Severe sepsis secondary to PD associated catheter peritonitis with Candida glabrata fungemia, enterococcal Avium/staph hemolyticus bacteremia. Overall clinically improved  Source of infection likely PD catheter-now since removed ID following-last note on 8/8-Unasyn + micafungin-14 days with 8/7 as day 1  TTE negative Per ID note-no plans for TEE as this is transient bacteremia from peritonitis Ophthalmology eval negative for endophthalmitis (02/21/2023 note from Dr. Georga Hacking)   Acute hypoxic respiratory failure requiring BiPAP-likely due to volume overload in the setting of ESRD/suspected OHS/OSA Required BiPAP done in the ICU Currently stable on nasal cannula-around 6 L-will need to be slowly titrated down. Volume removal with HD  ESRD on home peritoneal dialysis Has been transitioned to HD Nephrology following closely Fistulogram showed widely patent fistula-TDC not placed-recommendations are to try fistula with HD tomorrow.  Normocytic anemia Due to ESRD and critical illness Follow CBC and transfuse if significant drop.  Transaminitis -shock liver Follow LFTs  Chronic biventricular HFrEF Volume overload HD Volume status reasonably stable but still has some leg edema  CAD status post CABG November 2023 -demand myocardial ischemia Chronic LBBB on EKG - chest pain-free  DAPT  Hypokalemia Repleted  Hypomagnesemia Repleted  DM2 Stay with SSI  Recent Labs    02/27/23 2252 02/28/23 0827 02/28/23 1154  GLUCAP 171* 180* 146*     Obesity - Body mass index is 32.91 kg/m.  Nutrition Status: Nutrition Problem: Increased nutrient needs Etiology: acute illness, chronic illness Signs/Symptoms: estimated needs Interventions: Ensure Enlive (each supplement provides 350kcal and 20 grams of protein), MVI, Liberalize Diet    Family Communication: None at side Disposition: SNF versus LTAC.   CBC: Recent Labs  Lab 02/24/23 0333 02/24/23 1326 02/25/23 0537  02/28/23 0129  WBC 10.2  --  9.5 6.3  HGB 8.3* 9.2* 9.0* 8.6*  HCT 26.2* 27.0* 28.3* 28.3*  MCV 85.3  --  87.1 89.8  PLT 126*  --  157 184   Basic Metabolic Panel: Recent Labs  Lab 02/23/23 0352 02/24/23 0333 02/24/23 1326 02/25/23 0537 02/28/23 0129  NA 137 133* 135 135 134*  K 3.9 3.5 3.8 3.3* 3.9  CL 95* 95*  --  97* 95*  CO2 24 26  --  26 27  GLUCOSE 184* 147*  --  126* 156*  BUN 61* 26*  --  12 26*  CREATININE 12.36* 6.82*  --  3.48* 6.49*  CALCIUM 7.9* 7.7*  --  7.5* 8.6*  MG 2.5* 1.7  --  1.8  --   PHOS 6.2* 3.4  --  2.5  --    GFR: Estimated Creatinine [name]: 11 mL/min (A) (by C-G formula based on SCr of 6.49 mg/dL (H)).   Scheduled Meds:  aspirin EC  81 mg Oral Daily   Chlorhexidine Gluconate Cloth  6 each Topical Daily   ciprofloxacin  500 mg Oral q1800   clopidogrel  75 mg Oral Daily   darbepoetin (ARANESP) injection - DIALYSIS  60 mcg Subcutaneous Q Fri-1800   feeding supplement  237 mL Oral BID BM   Gerhardt's butt cream   Topical QID   heparin  5,000 Units Subcutaneous Q8H   insulin aspart  0-5 Units Subcutaneous QHS   insulin aspart  0-6 Units Subcutaneous TID WC   midodrine  10 mg Oral TID WC   mometasone-formoterol  2 puff Inhalation BID   pantoprazole  40 mg Oral BID   sodium chloride flush  3 mL Intravenous Q12H   Continuous Infusions:  sodium chloride Stopped (02/22/23 1238)   ampicillin-sulbactam (UNASYN) IV Stopped (02/27/23 2205)   micafungin (MYCAMINE) 200 mg in sodium chloride 0.9 % 100 mL IVPB Stopped (02/28/23 0416)     LOS: 13 days   S  [facility] Office  [phone] Pager - Text Page per Loretha Stapler  If 7PM-7AM, please contact night-coverage per Amion 02/28/2023, 12:11 PM

## 2023-02-28 NOTE — TOC Initial Note (Signed)
Transition of Care [facility]) - Initial/Assessment Note    Patient Details  Name: [name] [name] MRN: [id number] Date of Birth: 08-18-1950  Transition [facility]) CM/SW Contact:    Mearl Latin, LCSW Phone Number: 02/28/2023, 9:25 AM  Clinical Narrative:                 Patient transferred to 5W. Patient was admitted from home with his spouse completing home PD. Patient will transition to HD here. CSW following for SNF workup.   Expected Discharge Plan: Skilled Nursing Facility Barriers to Discharge: Continued Medical Work up, English as a second language teacher, SNF Pending bed offer (Dialysis transport)   Patient Goals and CMS Choice            Expected Discharge Plan and Services In-house Referral: Clinical Social Work     Living arrangements for the past 2 months: Single Family Home                                      Prior Living Arrangements/Services Living arrangements for the past 2 months: Single Family Home Lives with:: Spouse Patient language and need for interpreter reviewed:: Yes        Need for Family Participation in Patient Care: Yes (Comment) Care giver support system in place?: Yes (comment)   Criminal Activity/Legal Involvement Pertinent to Current Situation/Hospitalization: No - Comment as needed  Activities of Daily Living      Permission Sought/Granted                  Emotional Assessment Appearance:: Appears stated age Attitude/Demeanor/Rapport: Unable to Assess Affect (typically observed): Unable to Assess Orientation: : Oriented to Self Alcohol / Substance Use: Not Applicable Psych Involvement: No (comment)  Admission diagnosis:  Disorientation [R41.0] NSTEMI (non-ST elevated myocardial infarction) (HCC) [I21.4] Acute encephalopathy [G93.40] Multifocal pneumonia [J18.9] Patient Active Problem List   Diagnosis Date Noted   Acute encephalopathy 02/15/2023   Gait abnormality 09/11/2022   Tremor 09/11/2022   Memory loss  09/11/2022   Hypertension associated with diabetes (HCC) 06/15/2022   Heart block AV second degree 06/15/2022   Peritoneal dialysis catheter in place [facility]) 06/14/2022   End stage renal disease on dialysis (HCC) 06/14/2022   Hyperglycemia 06/12/2022   AV block 06/07/2022   S/P CABG x 2 06/05/2022   Second degree AV block, Mobitz type I 12/27/2021   Unstable angina (HCC)    NSTEMI (non-ST elevated myocardial infarction) (HCC)    Rectal bleeding 07/13/2021   Allergic rhinitis 04/07/2020   Asthma 10/02/2019   Pain in left foot 12/18/2018   Chest pain 08/29/2018   Constipation 02/28/2018   ESRD (end stage renal disease) (HCC) 11/13/2017   Foul smelling urine 12/14/2016   Coronary artery disease involving native coronary artery of native heart without angina pectoris 10/27/2016   Chronic kidney disease, stage IV (severe) (HCC) 10/27/2016   Morbid obesity due to excess calories (HCC) 03/22/2016   CAD (coronary artery disease)    Erectile dysfunction 08/16/2015   Hemorrhoids 07/05/2015   Vitamin D deficiency 06/04/2015   OSA on CPAP 05/14/2015   Normocytic anemia 03/24/2015   Fatty liver 12/01/2014   Back pain 06/05/2013   OA (osteoarthritis) of knee 03/15/2011   CTS (carpal tunnel syndrome) 03/15/2011   Diabetes mellitus with stage 5 chronic kidney disease (HCC) 09/29/2008   Hyperlipidemia 09/29/2008   Essential hypertension 09/29/2008   GERD 09/29/2008  PCP:  Benetta Spar, MD Pharmacy:   [facility] [phone] - [place], [place] - [address] [place] [address] Phone: [phone] Fax: [phone]  [facility] - [place], [place] - [address] [place] [address] Phone: [phone] Fax: [phone]  [facility] - [place], [place] - [address] [place] [address] Phone: [phone] Fax:  [phone]     Social Determinants of Health (SDOH) Social History: SDOH Screenings   Food Insecurity: No Food Insecurity (06/07/2022)  Housing: Low Risk  (06/07/2022)  Transportation Needs: No Transportation Needs (06/07/2022)  Utilities: Not At Risk (06/07/2022)  Alcohol Screen: Low Risk  (12/29/2021)  Depression (PHQ2-9): High Risk (01/03/2023)  Financial Resource Strain: Low Risk  (12/29/2021)  Physical Activity: Sufficiently Active (12/29/2021)  Social Connections: Unknown (09/18/2022)   Received from [facility]  Stress: No Stress Concern Present (12/29/2021)  Tobacco Use: Medium Risk (02/15/2023)   SDOH Interventions:     Readmission Risk Interventions    06/19/2022    2:09 PM 06/01/2022   11:44 AM 01/20/2022    3:18 PM  Readmission Risk Prevention Plan  Transportation Screening Complete Complete Complete  PCP or Specialist Appt within 3-5 Days   Complete  HRI or Home Care Consult  Complete Complete  Social Work Consult for Recovery Care Planning/Counseling  Complete Complete  Palliative Care Screening  Not Applicable Not Applicable  Medication Review [facility]) Complete Referral to Pharmacy Complete  PCP or Specialist appointment within 3-5 days of discharge Complete    HRI or Home Care Consult Complete    SW Recovery Care/Counseling Consult Complete    Palliative Care Screening Not Applicable    Skilled Nursing Facility Not Applicable

## 2023-03-01 ENCOUNTER — Encounter (HOSPITAL_COMMUNITY): Payer: Self-pay | Admitting: Vascular Surgery

## 2023-03-01 ENCOUNTER — Encounter (HOSPITAL_COMMUNITY): Payer: Medicare Other | Admitting: Occupational Therapy

## 2023-03-01 DIAGNOSIS — G9340 Encephalopathy, unspecified: Secondary | ICD-10-CM | POA: Diagnosis not present

## 2023-03-01 DIAGNOSIS — I10 Essential (primary) hypertension: Secondary | ICD-10-CM | POA: Diagnosis not present

## 2023-03-01 DIAGNOSIS — N186 End stage renal disease: Secondary | ICD-10-CM | POA: Diagnosis not present

## 2023-03-01 DIAGNOSIS — I214 Non-ST elevation (NSTEMI) myocardial infarction: Secondary | ICD-10-CM | POA: Diagnosis not present

## 2023-03-01 LAB — CBC
HCT: 26.9 % — ABNORMAL LOW (ref 39.0–52.0)
Hemoglobin: 8.2 g/dL — ABNORMAL LOW (ref 13.0–17.0)
MCH: 27.2 pg (ref 26.0–34.0)
MCHC: 30.5 g/dL (ref 30.0–36.0)
MCV: 89.1 fL (ref 80.0–100.0)
Platelets: 159 10*3/uL (ref 150–400)
RBC: 3.02 MIL/uL — ABNORMAL LOW (ref 4.22–5.81)
RDW: 20.5 % — ABNORMAL HIGH (ref 11.5–15.5)
WBC: 5.4 10*3/uL (ref 4.0–10.5)
nRBC: 0.6 % — ABNORMAL HIGH (ref 0.0–0.2)

## 2023-03-01 LAB — RENAL FUNCTION PANEL
Albumin: 1.5 g/dL — ABNORMAL LOW (ref 3.5–5.0)
Anion gap: 16 — ABNORMAL HIGH (ref 5–15)
BUN: 40 mg/dL — ABNORMAL HIGH (ref 8–23)
CO2: 24 mmol/L (ref 22–32)
Calcium: 9 mg/dL (ref 8.9–10.3)
Chloride: 95 mmol/L — ABNORMAL LOW (ref 98–111)
Creatinine, Ser: 8.74 mg/dL — ABNORMAL HIGH (ref 0.61–1.24)
GFR, Estimated: 6 mL/min — ABNORMAL LOW (ref >60)
Glucose, Bld: 144 mg/dL — ABNORMAL HIGH (ref 70–99)
Phosphorus: 5 mg/dL — ABNORMAL HIGH (ref 2.5–4.6)
Potassium: 4.2 mmol/L (ref 3.5–5.1)
Sodium: 135 mmol/L (ref 135–145)

## 2023-03-01 LAB — GLUCOSE, CAPILLARY
Glucose-Capillary: 166 mg/dL — ABNORMAL HIGH (ref 70–99)
Glucose-Capillary: 128 mg/dL — ABNORMAL HIGH (ref 70–99)
Glucose-Capillary: 158 mg/dL — ABNORMAL HIGH (ref 70–99)

## 2023-03-01 MED ORDER — RENA-VITE PO TABS
1.0000 | ORAL_TABLET | Freq: Every day | ORAL | Status: DC
  Administered 2023-03-01: 1 via ORAL
  Filled 2023-03-01: qty 1

## 2023-03-01 MED ORDER — SODIUM CHLORIDE 0.9% FLUSH: 10.0000 mL | INTRAVENOUS | Status: DC | PRN

## 2023-03-01 MED ORDER — DARBEPOETIN ALFA 100 MCG/0.5ML IJ SOSY
100.0000 ug | PREFILLED_SYRINGE | INTRAMUSCULAR | Status: DC
  Filled 2023-03-01: qty 0.5

## 2023-03-01 MED FILL — Lidocaine HCl Local Preservative Free (PF) Inj 1%: INTRAMUSCULAR | Qty: 30 | Status: AC

## 2023-03-01 NOTE — Progress Notes (Signed)
[facility] Progress Note   Subjective:    Seen and examined patient at bedside. Seen sitting in recliner. No acute issues. Plan for HD this afternoon. F'gram showed AVF is widely patent. Will try using his AVF again with HD today. If we run into problems, will reach back out to [facility] for the [facility] placement.  Objective Vitals:   02/28/23 1643 02/28/23 2008 03/01/23 0315 03/01/23 0500  BP: 117/69 123/65 (!) 148/87   Pulse:  89 97   Resp: 20 18 16    Temp:  98.2 F (36.8 C) 98 F (36.7 C)   TempSrc:  Oral Oral   SpO2: 95%  100%   Weight:    93.4 kg  Height:       Physical Exam General: Awake and alert Heart: Normal rate, no rub Lungs: Chest rise no increased work of breathing Abdomen: soft, obese, PD cath has been removed Extremities: No pedal edema, warm and well-perfused LUA AVF +positive thrill  temp HD cath in place Neuro: as above  Filed Weights   02/27/23 0827 02/27/23 1231 03/01/23 0500  Weight: 95.2 kg 92.5 kg 93.4 kg    Intake/Output Summary (Last 24 hours) at 03/01/2023 1100 Last data filed at 02/28/2023 1200 Gross per 24 hour  Intake 240 ml  Output 0 ml  Net 240 ml    Additional Objective Labs: Basic Metabolic Panel: Recent Labs  Lab 02/24/23 0333 02/24/23 1326 02/25/23 0537 02/28/23 0129 03/01/23 0307  NA 133*   < > 135 134* 135  K 3.5   < > 3.3* 3.9 4.2  CL 95*  --  97* 95* 95*  CO2 26  --  26 27 24   GLUCOSE 147*  --  126* 156* 144*  BUN 26*  --  12 26* 40*  CREATININE 6.82*  --  3.48* 6.49* 8.74*  CALCIUM 7.7*  --  7.5* 8.6* 9.0  PHOS 3.4  --  2.5  --  5.0*   < > = values in this interval not displayed.   Liver Function Tests: Recent Labs  Lab 02/24/23 0333 02/25/23 0537 03/01/23 0307  ALBUMIN <1.5* <1.5* 1.5*   No results for input(s): "LIPASE", "AMYLASE" in the last 168 hours. CBC: Recent Labs  Lab 02/23/23 0352 02/24/23 0333 02/24/23 1326 02/25/23 0537 02/28/23 0129 03/01/23 0307  WBC 9.4 10.2  --  9.5 6.3 5.4   HGB 8.6* 8.3*   < > 9.0* 8.6* 8.2*  HCT 26.8* 26.2*   < > 28.3* 28.3* 26.9*  MCV 87.3 85.3  --  87.1 89.8 89.1  PLT 106* 126*  --  157 184 159   < > = values in this interval not displayed.   Blood Culture    Component Value Date/Time   SDES BLOOD RIGHT HAND 02/19/2023 1855   SPECREQUEST  02/19/2023 1855    BOTTLES DRAWN AEROBIC ONLY Blood Culture results may not be optimal due to an inadequate volume of blood received in culture bottles   CULT  02/19/2023 1855    NO GROWTH 5 DAYS Performed at [facility], [address] [place], [place] [address]    REPTSTATUS 02/24/2023 FINAL 02/19/2023 1855    Cardiac Enzymes: No results for input(s): "CKTOTAL", "CKMB", "CKMBINDEX", "TROPONINI" in the last 168 hours. CBG: Recent Labs  Lab 02/28/23 0827 02/28/23 1154 02/28/23 1637 02/28/23 2108 03/01/23 0821  GLUCAP 180* 146* 126* 183* 166*   Iron Studies: No results for input(s): "IRON", "TIBC", "TRANSFERRIN", "FERRITIN" in the last  72 hours. Lab Results  Component Value Date   INR 1.2 02/18/2023   INR 1.2 02/17/2023   INR 1.3 (H) 02/16/2023   Studies/Results: PERIPHERAL VASCULAR CATHETERIZATION  Result Date: 02/28/2023 Images from the original result were not included. Patient name: [name] [name] MRN: [id number] DOB: 10/15/1950 Sex: male 02/28/2023 Pre-operative Diagnosis: Left upper extremity fistula function Post-operative diagnosis:  Same Surgeon:  [name] Sparrow, MD Procedure Performed: 1.  Ultrasound-guided micropuncture access of the left brachiocephalic fistula 2.  Fistulogram 3.  Balloon venoplasty 6 x 80, 8 x 60, 7 x 40 4.  Drug-coated balloon venoplasty 7 x 40 mm x 2 Indications: Patient is a 72 year old male with end-stage renal disease requiring dialysis.  Last week, he had his peritoneal dialysis catheter removed due to infection.  Previous history of left-sided brachiocephalic fistula, that was working but has not been used in quite some time due to his peritoneal  dialysis.  At last attempt, the fistula did not work appropriately.  After discussing the risks and benefits of left-sided fistulogram with possible intervention, as well as tunneled dialysis catheter placement, [name] [name] elected to proceed. Findings: Widely patent fistula anastomosis.  3 lesions along the fistula with greater than 60% stenosis, 2 in the upper arm, 1 at the axillary vein confluence. No central stenosis  Procedure:  The patient was identified in the holding area and taken to room 8.  The patient was then placed supine on the table and prepped and draped in the usual sterile fashion.  A time out was called.  Ultrasound was used to evaluate the left arteriovenous fistula.  An ultrasound-guided micropuncture needle was used to access the fistula in antegrade fashion.  Fistulogram followed.  See results above.  I elected to intervene on the 3 lesions.  The patient was heparinized, a 6 French sheath was placed.  A 6 x 80 mm balloon was used on all 3 lesions, followed by a 7 mm and 8 mm balloon.  There was continued wasting with the 8 mm balloon, therefore I downsized to a 7 x 40 mm balloon with wasting resolving at the axillary vein confluence with an inflation to 20 atm. Wasting at the mid arm and 2 locations improved dramatically to less than 30%.  I elected to use a 7 x 40 mm drug-coated balloon to help prevent further stenosis.  The initial balloon was inflated at the axillary vein confluence with an excellent result.  Unfortunately, [name] [name] tolerated the procedure rather poorly and stated that he was done and he wanted to stop the procedure immediately.  Therefore I did not use drug-coated balloon venoplasty on the other 2 upper arm lesions.  Follow-up venography demonstrated excellent result.  There is some tortuosity, but no flow-limiting stenosis appreciated.  [name] [name] stated he did not want to move forward with tunneled dialysis catheter placement. I respected his wishes and terminated the case. The  left-sided brachiocephalic fistula can be accessed.  Should there continue to be difficulties, he would need open revision to decrease some of the tortuosity. Should the fistula malfunction, I am happy to place a tunneled dialysis catheter tomorrow afternoon in the operating room where Monti would tolerate it. Impression: Successful balloon angioplasty of left-sided brachiocephalic fistula.  The fistula can be accessed. Fara Olden, MD [facility] Office: [phone]   DG CHEST PORT 1 VIEW  Result Date: 02/28/2023 CLINICAL DATA:  Hypoxia. EXAM: PORTABLE CHEST 1 VIEW COMPARISON:  02/23/2023 FINDINGS: Patchy mid and lower lung  airspace disease again noted, similar to prior given differing technique. Lung volumes remain low. Cardiopericardial silhouette is at upper limits of normal for size. Right IJ catheter tip overlies the proximal SVC level. Telemetry leads overlie the chest. IMPRESSION: Low volume film with patchy mid and lower lung airspace disease, similar to prior. Electronically Signed   By: Kennith Center M.D.   On: 02/28/2023 07:59    Medications:  sodium chloride Stopped (02/22/23 1238)   ampicillin-sulbactam (UNASYN) IV 3 g (03/01/23 0809)   micafungin (MYCAMINE) 200 mg in sodium chloride 0.9 % 100 mL IVPB 200 mg (03/01/23 0240)    aspirin EC  81 mg Oral Daily   Chlorhexidine Gluconate Cloth  6 each Topical Daily   ciprofloxacin  500 mg Oral q1800   clopidogrel  75 mg Oral Daily   darbepoetin (ARANESP) injection - DIALYSIS  60 mcg Subcutaneous Q Fri-1800   feeding supplement  237 mL Oral BID BM   Gerhardt's butt cream   Topical QID   heparin  5,000 Units Subcutaneous Q8H   insulin aspart  0-5 Units Subcutaneous QHS   insulin aspart  0-6 Units Subcutaneous TID WC   midodrine  10 mg Oral TID WC   mometasone-formoterol  2 puff Inhalation BID   multivitamin  1 tablet Oral QHS   pantoprazole  40 mg Oral BID   sodium chloride flush  3 mL Intravenous Q12H     Dialysis Orders (PD):   Followed by [facility] (note his PD RN was on vacation  but nephrology called [place] location for orders   [facility] - [facility] [place] [facility] - 100kg 5 cycles, 3000 fill vol (note pt states on consult he thinks is 2.5 liters), total time: 10 hrs, last fill 1500 cc Mircera 150 mcg q monthly Venofer 100mg  once a month L AVF +thrill but small + tortuous   CXR 8/05 - IMPRESSION: Persistent bibasilar opacities, which may represent atelectasis or pneumonia.   Home med --> albuterol, xanax, aspirin, aotrvastatin, clopidogrel, esomeprazole, gabapentin, insulin glargine, linagliptin, midodrine   Assessment/Plan: Peritonitis PD catheter related peritonitis -also with blood cultures positive for fungus.  Followed by ID on antibiotics as well as micafungin.  PD catheter removed on 8/5.  Per ccm / ID.  Vol overload/ pulm edema -had diet assist on Fri and Sat.  Ongoing fluid removal with dialysis as tolerated.  Marland Kitchen   ESRD - pt rec'd PD initially, then PD cath pulled due to infection. Had HD 3x this past week, last one on Sat. TTS schedule this week-next HD this afternoon. Return to MWF schedule next week HD access - PD not working 8/2 --> IR placed temp HD catheter and underwent HD 8/3.  PD cath removed 8/05 due to fungal sepsis. Had iHD on 8/06 via temp cath then it was dc'd for line holiday. L arm AVF was not usable when we tried on 8/08 (infiltrated, tortuous, too deep?). Temp cath replaced 8/09 by CCM, line holiday completed and cultures were clear on 8/5. Discussed with ID today who is okay with permanent catheter placement. S/p F'gram today: AVF is widely patent, thus, TDC was not placed, will try using the AVF at HD today. If we run into problems, will reach back out to [facility] for them to place a TDC. We appreciate their help Hypotension - chronic issue on home midodrine 5 tid. Here ^'d to 10mg  tid + 10mg  pre HD. Continue until we can get him to a new dry wt  then can back off.  Anemia esrd - Hb now 8.2, will raise ESA.  Secondary hyperparathyroidism -corrected calcium normal, binder stopped and phos now at goal.  [name], NP [facility] 03/01/2023,11:00 AM  LOS: 14 days

## 2023-03-01 NOTE — Progress Notes (Signed)
Following pt's case to assist with out-pt HD at d/c. Pt was PD prior to admission. If pt returns home, then will need to see if pt prefers to receive out-pt HD treatments at [facility] (pt was receiving PD care there prior to admission) or if pt has another preference. If pt to need rehab at snf, will need to coordinate clinic placement according to snf placement. Contacted CSW to discuss pt's case. Will assist as needed.   Olivia Canter Renal Navigator [phone]

## 2023-03-01 NOTE — Progress Notes (Signed)
  Progress Note    03/01/2023 8:39 AM 1 Day Post-Op  Subjective: Feeling better today and eating pancakes  Vitals:   02/28/23 2008 03/01/23 0315  BP: 123/65 (!) 148/87  Pulse: 89 97  Resp: 18 16  Temp: 98.2 F (36.8 C) 98 F (36.7 C)  SpO2:  100%    Physical Exam: He is awake alert and oriented Nonlabored respirations Right IJ nontunneled catheter is in place Left upper arm fistula with improved pulsatility there is significant bruising of the left upper arm  CBC    Component Value Date/Time   WBC 5.4 03/01/2023 0307   RBC 3.02 (L) 03/01/2023 0307   HGB 8.2 (L) 03/01/2023 0307   HGB 11.3 (L) 12/22/2021 1017   HCT 26.9 (L) 03/01/2023 0307   HCT 33.7 (L) 12/22/2021 1017   PLT 159 03/01/2023 0307   PLT 117 (L) 12/22/2021 1017   MCV 89.1 03/01/2023 0307   MCV 91 12/22/2021 1017   MCH 27.2 03/01/2023 0307   MCHC 30.5 03/01/2023 0307   RDW 20.5 (H) 03/01/2023 0307   RDW 13.7 12/22/2021 1017   LYMPHSABS 0.1 (L) 02/19/2023 0458   MONOABS 0.1 02/19/2023 0458   EOSABS 0.0 02/19/2023 0458   BASOSABS 0.0 02/19/2023 0458    BMET    Component Value Date/Time   NA 135 03/01/2023 0307   NA 136 12/22/2021 1017   K 4.2 03/01/2023 0307   CL 95 (L) 03/01/2023 0307   CO2 24 03/01/2023 0307   GLUCOSE 144 (H) 03/01/2023 0307   BUN 40 (H) 03/01/2023 0307   BUN 58 (H) 12/22/2021 1017   CREATININE 8.74 (H) 03/01/2023 0307   CREATININE 18.89 (H) 02/09/2020 1126   CALCIUM 9.0 03/01/2023 0307   GFRNONAA 6 (L) 03/01/2023 0307   GFRNONAA 2 (L) 02/09/2020 1126   GFRAA <4 (L) 02/09/2020 1126    INR    Component Value Date/Time   INR 1.2 02/18/2023 0425     Intake/Output Summary (Last 24 hours) at 03/01/2023 0839 Last data filed at 02/28/2023 1200 Gross per 24 hour  Intake 240 ml  Output 0 ml  Net 240 ml     Assessment/plan:  72 y.o. male is s/p fistulogram with balloon angioplasty left upper arm fistula.  Plan will be to attempt to cannulate fistula today and if this  is not possible we will use temporary catheter and he will need conversion to tunneled dialysis catheter in the OR which he could not tolerate in the Cath Lab yesterday and in the future will need consideration of fistula revision with likely transposition.    C. Randie Heinz, MD [facility] Office: [phone] Pager: [phone]  03/01/2023 8:39 AM

## 2023-03-01 NOTE — Progress Notes (Signed)
Occupational Therapy Treatment Patient Details Name: [name] [name] MRN: [id number] DOB: 1951-03-07 Today's Date: 03/01/2023   History of present illness 72 y/o male admitted 7/31 for confusion and progressive SOB, pt with PNA and transaminitis.8/3 PD catheter malfunction. 8/5 Peritoneal dialysis catheter removed. PMHx:  CABG, COPD, IDT2DM, ESRD on PD, CAD, Rt TKA, HTN, gout   OT comments  Pt eager for OOB on arrival. Pt unable to recall PT session yesterday. Pt displaying poor safety, problem solving, activity tolerance, and balance. Pt observed to bump into while navigating to/from sink requiring cues for visual compensatory strategies and problem solving. Fatiguing quickly and able to stand for 30-45 second bouts, needing 4 seated rest breaks during session prior to oral care at sink, during oral care, after oral care, and prior to transition from chair to [facility]. HR max of 136 and observed with greater amount of tremors when fatigued. Performing oral care at sink with min A for balance. Needing cues throughout for safe transfers, sequencing mobility, and RW management. Patient will benefit from continued inpatient follow up therapy, <3 hours/day.        If plan is discharge home, recommend the following:  A little help with walking and/or transfers;A little help with bathing/dressing/bathroom;A lot of help with bathing/dressing/bathroom;Assistance with cooking/housework;Direct supervision/assist for medications management;Assist for transportation;Help with stairs or ramp for entrance;Direct supervision/assist for financial management;Assistance with feeding   Equipment Recommendations  Other (comment) (defer)    Recommendations for Other Services      Precautions / Restrictions Precautions Precautions: Fall;Other (comment) Precaution Comments: watch sats Restrictions Weight Bearing Restrictions: No       Mobility Bed Mobility Overal bed mobility: Needs Assistance Bed Mobility:  Supine to Sit     Supine to sit: HOB elevated, Used rails, Min assist     General bed mobility comments: Min A and cues for sequencing and truncal elevation    Transfers Overall transfer level: Needs assistance Equipment used: Rolling walker (2 wheels) Transfers: Sit to/from Stand, Bed to chair/wheelchair/BSC Sit to Stand: Contact guard assist, Min assist     Step pivot transfers: Min assist     General transfer comment: Cues for hand placement prior to rise especially when rising from chair without arms. CGA up from bed and recliner but Min A up from chair without arms and BSC with fatigue.     Balance Overall balance assessment: Needs assistance Sitting-balance support: No upper extremity supported, Feet supported Sitting balance-Leahy Scale: Poor Sitting balance - Comments: poor at end of session with fatigue. CGA in recliner until scooted back   Standing balance support: Bilateral upper extremity supported, During functional activity, Reliant on assistive device for balance Standing balance-Leahy Scale: Poor Standing balance comment: RW in standing                           ADL either performed or assessed with clinical judgement   ADL Overall ADL's : Needs assistance/impaired Eating/Feeding: Set up;Sitting Eating/Feeding Details (indicate cue type and reason): med pass on arrival Grooming: Oral care;Minimal assistance;Standing;Sitting Grooming Details (indicate cue type and reason): Initiating in standing but with fatigue max request of seated rest break. HR up to 121 at sink. Min A for standing balance during prep of toothbrush                 Toilet Transfer: Stand-pivot;Rolling walker (2 wheels);BSC/3in1;Contact guard assist;Cueing for sequencing;Cueing for safety Toilet Transfer Details (indicate cue type and reason): for  balance. Cues for safety and sequencing of the transfer. Toileting- Clothing Manipulation and Hygiene: Total assistance;Sit  to/from stand Toileting - Clothing Manipulation Details (indicate cue type and reason): reliant on BUE for balance as pt with max fatigue at end of session and OT wiping for pt     Functional mobility during ADLs: Rolling walker (2 wheels);Contact guard assist General ADL Comments: Min A up from chair without arms in room used for seated rest break; otherwise contact guard A with BUE support; up to min A with 1 UE support. Standing during ADL for 30 second bouts this session    Extremity/Trunk Assessment Upper Extremity Assessment Upper Extremity Assessment: Generalized weakness (BUE tremor)   Lower Extremity Assessment Lower Extremity Assessment: Defer to PT evaluation        Vision   Additional Comments: Obsesrved to bump into sink counter when trying to walk around to square up with sink and then walk around to chair. Vision needing further assessment   Perception     Praxis      Cognition Arousal: Alert Behavior During Therapy: WFL for tasks assessed/performed Overall Cognitive Status: Impaired/Different from baseline Area of Impairment: Attention, Memory, Following commands, Safety/judgement, Awareness, Problem solving                   Current Attention Level: Sustained Memory: Decreased short-term memory Following Commands: Follows one step commands consistently, Follows one step commands with increased time Safety/Judgement: Decreased awareness of deficits, Decreased awareness of safety Awareness: Intellectual, Emergent (aware of fatigue; requesting rest breaks.) Problem Solving: Slow processing, Requires verbal cues, Requires tactile cues, Decreased initiation General Comments: eager to be up OOB        Exercises      Shoulder Instructions       General Comments VSS    Pertinent Vitals/ Pain       Pain Assessment Pain Assessment: No/denies pain  Home Living                                          Prior Functioning/Environment               Frequency  Min 2X/week        Progress Toward Goals  OT Goals(current goals can now be found in the care plan section)  Progress towards OT goals: Progressing toward goals  Acute Rehab OT Goals Patient Stated Goal: get better OT Goal Formulation: With patient Time For Goal Achievement: 03/08/23 Potential to Achieve Goals: Good ADL Goals Pt Will Perform Grooming: standing;with contact guard assist Pt Will Perform Upper Body Dressing: with modified independence;sitting Pt Will Perform Lower Body Dressing: with min assist;sit to/from stand Pt Will Transfer to Toilet: ambulating;with contact guard assist;regular height toilet Pt/caregiver will Perform Home Exercise Program: Increased strength;Both right and left upper extremity;With written HEP provided Additional ADL Goal #1: Pt will follow three step commands with increased time and min cues Additional ADL Goal #2: Pt will perform 5+ mins OOB mobility with no more than one seated rest break.  Plan      Co-evaluation                 AM-PAC OT "6 Clicks" Daily Activity     Outcome Measure   Help from another person eating meals?: A Little Help from another person taking care of personal grooming?: A Little Help from another person  toileting, which includes using toliet, bedpan, or urinal?: A Little Help from another person bathing (including washing, rinsing, drying)?: A Lot Help from another person to put on and taking off regular upper body clothing?: A Little Help from another person to put on and taking off regular lower body clothing?: A Lot 6 Click Score: 16    End of Session Equipment Utilized During Treatment: Gait belt;Rolling walker (2 wheels)  OT Visit Diagnosis: Unsteadiness on feet (R26.81);Muscle weakness (generalized) (M62.81);Other symptoms and signs involving cognitive function   Activity Tolerance Patient tolerated treatment well   Patient Left in chair;with call bell/phone  within reach;with chair alarm set   Nurse Communication Mobility status        Time: 9562-1308 OT Time Calculation (min): 37 min  Charges: OT General Charges $OT Visit: 1 Visit OT Treatments $Self Care/Home Management : 23-37 mins  [name], OTR/L [facility] Acute Rehabilitation Office: [phone]   [name] 03/01/2023, 9:08 AM

## 2023-03-01 NOTE — Progress Notes (Signed)
I was called over by expert cannulate dialysis tech to assess arterial needle. Went to check the stick and no return.  Pt recannuled by this RN x2 with 15g, BFR decreased to ensure functioning properly. Slowly increased BFR to prescribed 400.

## 2023-03-01 NOTE — Progress Notes (Signed)
Received patient in bed to unit.  Alert and oriented.  Informed consent signed and in chart.   TX duration:3.5 hours  Patient tolerated well.  Transported back to the room  Alert, without acute distress.  Hand-off given to patient's nurse.   Access used: fistula Access issues: none  Total UF removed: 3000 ml Medication(s) given: none Post HD VS: 114/84 Post HD weight: 88.4    03/01/23 2015  Vitals  Temp 98.6 F (37 C)  Temp Source Axillary  BP 108/71  MAP (mmHg) 83  BP Location Right Arm  BP Method Automatic  Patient Position (if appropriate) Lying  Pulse Rate 99  Pulse Rate Source Monitor  ECG Heart Rate (!) 104  Resp (!) 23  Oxygen Therapy  SpO2 98 %  O2 Device Room Air  Heater temperature 98.6 F (37 C)  Patient Activity (if Appropriate) In bed  Pulse Oximetry Type Continuous  During Treatment Monitoring  Blood Flow Rate (mL/min) 0 mL/min  Arterial Pressure (mmHg) -66.46 mmHg  Venous Pressure (mmHg) 153.53 mmHg  TMP (mmHg) 19.8 mmHg  Ultrafiltration Rate (mL/min) 1264 mL/min  Dialysate Flow Rate (mL/min) 299 ml/min  Post Treatment  Dialyzer Clearance Lightly streaked  Hemodialysis Intake (mL) 0 mL  Liters Processed 62.1  Fluid Removed (mL) 3000 mL  Tolerated HD Treatment Yes  Post-Hemodialysis Comments HD tx completed as expected, tolerated well, confused at time,  AVG/AVF Arterial Site Held (minutes) 10 minutes  AVG/AVF Venous Site Held (minutes) 10 minutes  Note  Patient Observations `pt is en bed resting  Fistula / Graft Left Other (Comment) Arteriovenous fistula  Placement Date/Time: 08/09/16 1327   Placed prior to admission: No  Orientation: Left  Access Location: (c) Other (Comment)  Access Type: Arteriovenous fistula  Site Condition No complications  Fistula / Graft Assessment Present;Thrill;Bruit  Status Deaccessed  Drainage Description None

## 2023-03-01 NOTE — Progress Notes (Signed)
[name] [name]  QMV:[id number] DOB: 11/30/1950 DOA: 02/14/2023 PCP: Benetta Spar, MD    Brief Narrative:  72 year old with a history of CABG November 2023, COPD, DM2, GERD, chronic hypotension on midodrine, and ESRD on PD who was brought to the [facility] ER by his wife 02/15/2023 with significant confusion and progressive shortness of breath over a few days.  He was found to have sepsis secondary to peritoneal dialysis associated catheter peritonitis and polymicrobial bacteremia/fungemia.  Hospital course complicated by worsening encephalopathy with hypoxemia requiring transfer to the ICU.  See below for further details.  Significant Events: 7/31-8/1>> admit via [facility] ED for AMS - transferred to [facility]  8/3>> malfunction of PD catheter - R IJ temporary HD cath placed with HD performed 8/4>>decline in status post HD prompting PCCM consultation 8/5>> blood cultures positive for Candida glabrata and staph species 8/5>> PD catheter removed in OR 8/7>> encephalopathic/severe abdominal pain-not keeping his oxygen on-ICU consult as patient on 15 L of oxygen.   8/8>> line holiday. unable to cannulate AVF for HD. 8/9>> temp HD line placed.  8/13>> [facility] assumed care 8/14>> fistulogram-TDC not placed.  Goals of Care:   Code Status: Full Code   DVT prophylaxis: heparin injection 5,000 Units Start: 02/15/23 1745  Subjective:  No major issues overnight-somewhat confused this morning but easily reoriented.  Objective: Blood pressure (!) 148/87, pulse 97, temperature 98 F (36.7 C), temperature source Oral, resp. rate 16, height 5\' 6"  (1.676 m), weight 93.4 kg, SpO2 100%.   Gen Exam:Alert awake-not in any distress HEENT:atraumatic, normocephalic Chest: B/L clear to auscultation anteriorly CVS:S1S2 regular Abdomen:soft non tender, non distended Extremities:no edema Neurology: Non focal Skin: no rash  Assessment & Plan: Acute metabolic encephalopathy Secondary to uremia/peritonitis Overall  improved-still with some lingering intermittent confusion  Continue supportive care  Severe sepsis secondary to PD associated catheter peritonitis with Candida glabrata fungemia, enterococcal Avium/staph hemolyticus bacteremia. Sepsis physiology improved Source of infection likely PD catheter-now since removed ID following-last note on 8/8-Unasyn + micafungin-14 days with 8/7 as day 1  TTE negative Per ID note-no plans for TEE as this is transient bacteremia from peritonitis Ophthalmology eval negative for endophthalmitis (02/21/2023 note from Dr. Georga Hacking)   Acute hypoxic respiratory failure requiring BiPAP-likely due to volume overload in the setting of ESRD/suspected OHS/OSA Required BiPAP done in the ICU Overall stable-continue to slowly wean down FiO2 Volume removal with HD.    ESRD on home peritoneal dialysis Has been transitioned to HD Nephrology following closely Fistulogram on 8/14-widely patent fistula-recommendations are to attempt cannulation with HD today-if not successful-will consult vascular surgery for [facility].    Normocytic anemia Due to ESRD and critical illness Follow CBC and transfuse if significant drop.  Transaminitis -shock liver Follow LFTs  Chronic biventricular HFrEF Volume overload HD Volume status reasonably stable but still has some leg edema  CAD status post CABG November 2023 -demand myocardial ischemia Chronic LBBB on EKG - chest pain-free  DAPT  Hypokalemia Repleted  Hypomagnesemia Repleted  DM2 Stay with SSI  Recent Labs    02/28/23 1637 02/28/23 2108 03/01/23 0821  GLUCAP 126* 183* 166*     Obesity - Body mass index is 33.23 kg/m.  Nutrition Status: Nutrition Problem: Increased nutrient needs Etiology: acute illness, chronic illness Signs/Symptoms: estimated needs Interventions: Ensure Enlive (each supplement provides 350kcal and 20 grams of protein), MVI    Family Communication: None at side Disposition: SNF versus  LTAC.   CBC: Recent Labs  Lab 02/25/23 0537 02/28/23  0129 03/01/23 0307  WBC 9.5 6.3 5.4  HGB 9.0* 8.6* 8.2*  HCT 28.3* 28.3* 26.9*  MCV 87.1 89.8 89.1  PLT 157 184 159   Basic Metabolic Panel: Recent Labs  Lab 02/23/23 0352 02/24/23 0333 02/24/23 1326 02/25/23 0537 02/28/23 0129 03/01/23 0307  NA 137 133*   < > 135 134* 135  K 3.9 3.5   < > 3.3* 3.9 4.2  CL 95* 95*  --  97* 95* 95*  CO2 24 26  --  26 27 24   GLUCOSE 184* 147*  --  126* 156* 144*  BUN 61* 26*  --  12 26* 40*  CREATININE 12.36* 6.82*  --  3.48* 6.49* 8.74*  CALCIUM 7.9* 7.7*  --  7.5* 8.6* 9.0  MG 2.5* 1.7  --  1.8  --   --   PHOS 6.2* 3.4  --  2.5  --  5.0*   < > = values in this interval not displayed.   GFR: Estimated Creatinine Clearance: 8.2 mL/min (A) (by C-G formula based on SCr of 8.74 mg/dL (H)).   Scheduled Meds:  aspirin EC  81 mg Oral Daily   Chlorhexidine Gluconate Cloth  6 each Topical Daily   ciprofloxacin  500 mg Oral q1800   clopidogrel  75 mg Oral Daily   [START ON 03/02/2023] darbepoetin (ARANESP) injection - DIALYSIS  100 mcg Subcutaneous Q Fri-1800   feeding supplement  237 mL Oral BID BM   Gerhardt's butt cream   Topical QID   heparin  5,000 Units Subcutaneous Q8H   insulin aspart  0-5 Units Subcutaneous QHS   insulin aspart  0-6 Units Subcutaneous TID WC   midodrine  10 mg Oral TID WC   mometasone-formoterol  2 puff Inhalation BID   multivitamin  1 tablet Oral QHS   pantoprazole  40 mg Oral BID   sodium chloride flush  3 mL Intravenous Q12H   Continuous Infusions:  sodium chloride Stopped (02/22/23 1238)   ampicillin-sulbactam (UNASYN) IV 3 g (03/01/23 0809)   micafungin (MYCAMINE) 200 mg in sodium chloride 0.9 % 100 mL IVPB 200 mg (03/01/23 0240)     LOS: 14 days   S  [facility] Office  [phone] Pager - Text Page per Loretha Stapler  If 7PM-7AM, please contact night-coverage per Amion 03/01/2023, 11:24 AM

## 2023-03-01 NOTE — Plan of Care (Signed)
Pt confused. Has tried to get OOB many times tonight. Yells out frequently. Pt argues with staff when trying to reorient him. Pulling leads off and O2 off almost hourly. IV team here and changed HD cath dressing after drawing labs. Pt remains on fluid restriction. No distress noted.    Problem: Education: Goal: Knowledge of disease or condition will improve Outcome: Progressing Goal: Knowledge of the prescribed therapeutic regimen will improve Outcome: Progressing Goal: Individualized Educational Video(s) Outcome: Progressing   Problem: Activity: Goal: Ability to tolerate increased activity will improve Outcome: Progressing   Problem: Respiratory: Goal: Ability to maintain a clear airway will improve Outcome: Progressing Goal: Levels of oxygenation will improve Outcome: Progressing Goal: Ability to maintain adequate ventilation will improve Outcome: Progressing   Problem: Fluid Volume: Goal: Ability to maintain a balanced intake and output will improve Outcome: Progressing   Problem: Metabolic: Goal: Ability to maintain appropriate glucose levels will improve Outcome: Progressing   Problem: Nutritional: Goal: Maintenance of adequate nutrition will improve Outcome: Progressing Goal: Progress toward achieving an optimal weight will improve Outcome: Progressing   Problem: Skin Integrity: Goal: Risk for impaired skin integrity will decrease Outcome: Progressing   Problem: Tissue Perfusion: Goal: Adequacy of tissue perfusion will improve Outcome: Progressing   Problem: Education: Goal: Knowledge of General Education information will improve Description: Including pain rating scale, medication(s)/side effects and non-pharmacologic comfort measures Outcome: Progressing   Problem: Health Behavior/Discharge Planning: Goal: Ability to manage health-related needs will improve Outcome: Progressing   Problem: Clinical Measurements: Goal: Ability to maintain clinical  measurements within normal limits will improve Outcome: Progressing Goal: Will remain free from infection Outcome: Progressing Goal: Diagnostic test results will improve Outcome: Progressing Goal: Respiratory complications will improve Outcome: Progressing Goal: Cardiovascular complication will be avoided Outcome: Progressing   Problem: Activity: Goal: Risk for activity intolerance will decrease Outcome: Progressing   Problem: Nutrition: Goal: Adequate nutrition will be maintained Outcome: Progressing   Problem: Coping: Goal: Level of anxiety will decrease Outcome: Progressing   Problem: Elimination: Goal: Will not experience complications related to bowel motility Outcome: Progressing Goal: Will not experience complications related to urinary retention Outcome: Progressing   Problem: Pain Managment: Goal: General experience of comfort will improve Outcome: Progressing   Problem: Safety: Goal: Ability to remain free from injury will improve Outcome: Progressing   Problem: Skin Integrity: Goal: Risk for impaired skin integrity will decrease Outcome: Progressing   Problem: Safety: Goal: Non-violent Restraint(s) Outcome: Progressing

## 2023-03-01 NOTE — Progress Notes (Signed)
Nutrition Follow-up  DOCUMENTATION CODES:   Not applicable  INTERVENTION:  Renal Multivitamin w/ minerals daily Ensure Enlive po BID, each supplement provides 350 kcal and 20 grams of protein. Discontinue Magic Cup  Meal ordering with assist   NUTRITION DIAGNOSIS:   Increased nutrient needs related to acute illness, chronic illness as evidenced by estimated needs. - Ongoing  GOAL:   Patient will meet greater than or equal to 90% of their needs - Ongoing   MONITOR:   PO intake, Supplement acceptance, Labs, I & O's  REASON FOR ASSESSMENT:   Consult Assessment of nutrition requirement/status  ASSESSMENT:   72 year old male who presented to the ED on 7/31 with cough, N/V, positive COVID test 10 days prior to presentation. PMH of CABG in November 2023, COPD, T2DM, GERD, ESRD on CCPD. Pt admitted with acute metabolic encephalopathy, possible HCAP, peritonitis.  08/02 - renal/carb modified diet with 1200 ml fluid restriction 08/03 - malfunction of peritoneal dialysis catheter, s/p temporary HD catheter placement in IR 08/04 - NPO 08/05 - NPO, s/p peritoneal dialysis catheter removal, diet advanced to renal with 1200 ml fluid restriction 08/06 - temporary HD catheter removal 08/07 - back to ICU with recurrent encephalopathy 08/13 - transferred back to the floor 08/14 - FMS removed; Op, s/p fistulogram, balloon venoplasty x 3; diet advanced to regular with 1800 mL fluid restriction  Pt sitting in chair, reports that he is eating better. Was unable to eat part of yesterday due to going to the OR. Denies any nausea or vomiting. Is drinking the Ensure's and water. Does not like the Magic Cup due to making him use the bathroom.   Pt requesting to sit on the edge of the bed; RD informed RN.   Meal Intake  8/2-8/14: 10-100% x 5 meals  Medications reviewed and include: Cipro, NovoLog SSI, Protonix, IV Unasyn, IV micafungin Labs reviewed: Sodium 135, Potassium 4.2, Phosphorus  5.0 CBG: 126-183 x 24 hrs   UOP: 0 mL x 24 hrs  Diet Order:   Diet Order             Diet regular Room service appropriate? Yes with Assist; Fluid consistency: Thin; Fluid restriction: 1800 mL Fluid  Diet effective now                   EDUCATION NEEDS:   No education needs have been identified at this time  Skin:  Skin Assessment: Skin Integrity Issues: Skin Integrity Issues:: Other (Comment), Incisions Incisions: abd Other: IAD anus  Last BM:  8/15 - Type 6, medium  Height:   Ht Readings from Last 1 Encounters:  02/14/23 5\' 6"  (1.676 m)    Weight:   Wt Readings from Last 1 Encounters:  03/01/23 93.4 kg    Ideal Body Weight:  64.5 kg  BMI:  Body mass index is 33.23 kg/m.  Estimated Nutritional Needs:  Kcal:  2100-2300 Protein:  110-125 grams Fluid:  1000 ml + UOP   Kirby Crigler RD, LDN Clinical Dietitian See [facility] for contact information.

## 2023-03-01 NOTE — TOC Progression Note (Signed)
Transition of Care [facility]) - Progression Note    Patient Details  Name: [name] [name] MRN: [id number] Date of Birth: [birth year]  Transition of Care [facility]) CM/SW Contact  Gordy Clement, RN Phone Number: 03/01/2023, 2:27 PM  Clinical Narrative:     CM met with Patient and Wife, bedside to discuss options for LTACH. Patient choice is Arts development officer .Marland Kitchen Liaison will reach out to patient/wife  [facility] will continue to follow patient for any additional discharge needs     Expected Discharge Plan: Skilled Nursing Facility Barriers to Discharge: Continued Medical Work up, English as a second language teacher, SNF Pending bed offer (Dialysis transport)  Expected Discharge Plan and Services In-house Referral: Clinical Social Work     Living arrangements for the past 2 months: Single Family Home                                       Social Determinants of Health (SDOH) Interventions SDOH Screenings   Food Insecurity: No Food Insecurity (06/07/2022)  Housing: Low Risk  (06/07/2022)  Transportation Needs: No Transportation Needs (06/07/2022)  Utilities: Not At Risk (06/07/2022)  Alcohol Screen: Low Risk  (12/29/2021)  Depression (PHQ2-9): High Risk (01/03/2023)  Financial Resource Strain: Low Risk  (12/29/2021)  Physical Activity: Sufficiently Active (12/29/2021)  Social Connections: Unknown (09/18/2022)   Received from [facility]  Stress: No Stress Concern Present (12/29/2021)  Tobacco Use: Medium Risk (02/15/2023)    Readmission Risk Interventions    06/19/2022    2:09 PM 06/01/2022   11:44 AM 01/20/2022    3:18 PM  Readmission Risk Prevention Plan  Transportation Screening Complete Complete Complete  PCP or Specialist Appt within 3-5 Days   Complete  HRI or Home Care Consult  Complete Complete  Social Work Consult for Recovery Care Planning/Counseling  Complete Complete  Palliative Care Screening  Not Applicable Not Applicable  Medication Review Oceanographer) Complete Referral to  Pharmacy Complete  PCP or Specialist appointment within 3-5 days of discharge Complete    HRI or Home Care Consult Complete    SW Recovery Care/Counseling Consult Complete    Palliative Care Screening Not Applicable    Skilled Nursing Facility Not Applicable

## 2023-03-02 ENCOUNTER — Encounter (HOSPITAL_COMMUNITY): Payer: Medicare Other

## 2023-03-02 ENCOUNTER — Inpatient Hospital Stay
Admission: RE | Admit: 2023-03-02 | Discharge: 2023-03-12 | Disposition: A | Discharge status: Rehabilitation Facility | Payer: Medicare Other | Source: Ambulatory Visit

## 2023-03-02 DIAGNOSIS — R5381 Other malaise: Secondary | ICD-10-CM | POA: Diagnosis not present

## 2023-03-02 DIAGNOSIS — I959 Hypotension, unspecified: Secondary | ICD-10-CM | POA: Diagnosis not present

## 2023-03-02 DIAGNOSIS — G479 Sleep disorder, unspecified: Secondary | ICD-10-CM | POA: Diagnosis not present

## 2023-03-02 DIAGNOSIS — Z87891 Personal history of nicotine dependence: Secondary | ICD-10-CM | POA: Diagnosis not present

## 2023-03-02 DIAGNOSIS — I2510 Atherosclerotic heart disease of native coronary artery without angina pectoris: Secondary | ICD-10-CM | POA: Diagnosis not present

## 2023-03-02 DIAGNOSIS — J449 Chronic obstructive pulmonary disease, unspecified: Secondary | ICD-10-CM | POA: Diagnosis not present

## 2023-03-02 DIAGNOSIS — R001 Bradycardia, unspecified: Secondary | ICD-10-CM | POA: Diagnosis not present

## 2023-03-02 DIAGNOSIS — I25118 Atherosclerotic heart disease of native coronary artery with other forms of angina pectoris: Secondary | ICD-10-CM | POA: Diagnosis not present

## 2023-03-02 DIAGNOSIS — I480 Paroxysmal atrial fibrillation: Secondary | ICD-10-CM | POA: Diagnosis not present

## 2023-03-02 DIAGNOSIS — R319 Hematuria, unspecified: Secondary | ICD-10-CM | POA: Diagnosis not present

## 2023-03-02 DIAGNOSIS — E1122 Type 2 diabetes mellitus with diabetic chronic kidney disease: Secondary | ICD-10-CM | POA: Diagnosis not present

## 2023-03-02 DIAGNOSIS — E871 Hypo-osmolality and hyponatremia: Secondary | ICD-10-CM | POA: Diagnosis not present

## 2023-03-02 DIAGNOSIS — F05 Delirium due to known physiological condition: Secondary | ICD-10-CM | POA: Diagnosis not present

## 2023-03-02 DIAGNOSIS — R451 Restlessness and agitation: Secondary | ICD-10-CM | POA: Diagnosis not present

## 2023-03-02 DIAGNOSIS — Z6841 Body Mass Index (BMI) 40.0 and over, adult: Secondary | ICD-10-CM | POA: Diagnosis not present

## 2023-03-02 DIAGNOSIS — R040 Epistaxis: Secondary | ICD-10-CM | POA: Diagnosis not present

## 2023-03-02 DIAGNOSIS — J9611 Chronic respiratory failure with hypoxia: Secondary | ICD-10-CM | POA: Insufficient documentation

## 2023-03-02 DIAGNOSIS — W06XXXA Fall from bed, initial encounter: Secondary | ICD-10-CM | POA: Diagnosis present

## 2023-03-02 DIAGNOSIS — I10 Essential (primary) hypertension: Secondary | ICD-10-CM | POA: Diagnosis not present

## 2023-03-02 DIAGNOSIS — J1282 Pneumonia due to coronavirus disease 2019: Secondary | ICD-10-CM | POA: Diagnosis not present

## 2023-03-02 DIAGNOSIS — N186 End stage renal disease: Secondary | ICD-10-CM | POA: Diagnosis not present

## 2023-03-02 DIAGNOSIS — Z951 Presence of aortocoronary bypass graft: Secondary | ICD-10-CM | POA: Diagnosis not present

## 2023-03-02 DIAGNOSIS — E43 Unspecified severe protein-calorie malnutrition: Secondary | ICD-10-CM | POA: Diagnosis not present

## 2023-03-02 DIAGNOSIS — G9340 Encephalopathy, unspecified: Secondary | ICD-10-CM | POA: Diagnosis not present

## 2023-03-02 DIAGNOSIS — N250 Renal osteodystrophy: Secondary | ICD-10-CM | POA: Diagnosis not present

## 2023-03-02 DIAGNOSIS — F32A Depression, unspecified: Secondary | ICD-10-CM | POA: Diagnosis not present

## 2023-03-02 DIAGNOSIS — I472 Ventricular tachycardia, unspecified: Secondary | ICD-10-CM | POA: Diagnosis not present

## 2023-03-02 DIAGNOSIS — G4733 Obstructive sleep apnea (adult) (pediatric): Secondary | ICD-10-CM | POA: Diagnosis not present

## 2023-03-02 DIAGNOSIS — F411 Generalized anxiety disorder: Secondary | ICD-10-CM | POA: Diagnosis not present

## 2023-03-02 DIAGNOSIS — F419 Anxiety disorder, unspecified: Secondary | ICD-10-CM | POA: Diagnosis not present

## 2023-03-02 DIAGNOSIS — G4709 Other insomnia: Secondary | ICD-10-CM | POA: Diagnosis not present

## 2023-03-02 DIAGNOSIS — Z992 Dependence on renal dialysis: Secondary | ICD-10-CM | POA: Diagnosis not present

## 2023-03-02 DIAGNOSIS — Z515 Encounter for palliative care: Secondary | ICD-10-CM | POA: Diagnosis not present

## 2023-03-02 DIAGNOSIS — R4182 Altered mental status, unspecified: Secondary | ICD-10-CM | POA: Diagnosis not present

## 2023-03-02 DIAGNOSIS — R0602 Shortness of breath: Secondary | ICD-10-CM | POA: Diagnosis not present

## 2023-03-02 DIAGNOSIS — N2581 Secondary hyperparathyroidism of renal origin: Secondary | ICD-10-CM | POA: Diagnosis not present

## 2023-03-02 DIAGNOSIS — D631 Anemia in chronic kidney disease: Secondary | ICD-10-CM | POA: Diagnosis not present

## 2023-03-02 DIAGNOSIS — I5020 Unspecified systolic (congestive) heart failure: Secondary | ICD-10-CM | POA: Diagnosis not present

## 2023-03-02 DIAGNOSIS — J189 Pneumonia, unspecified organism: Secondary | ICD-10-CM | POA: Diagnosis not present

## 2023-03-02 DIAGNOSIS — R569 Unspecified convulsions: Secondary | ICD-10-CM | POA: Diagnosis not present

## 2023-03-02 DIAGNOSIS — Z8616 Personal history of COVID-19: Secondary | ICD-10-CM | POA: Diagnosis not present

## 2023-03-02 DIAGNOSIS — Y92239 Unspecified place in hospital as the place of occurrence of the external cause: Secondary | ICD-10-CM | POA: Diagnosis present

## 2023-03-02 DIAGNOSIS — I5022 Chronic systolic (congestive) heart failure: Secondary | ICD-10-CM | POA: Diagnosis not present

## 2023-03-02 DIAGNOSIS — E8809 Other disorders of plasma-protein metabolism, not elsewhere classified: Secondary | ICD-10-CM | POA: Diagnosis not present

## 2023-03-02 DIAGNOSIS — I132 Hypertensive heart and chronic kidney disease with heart failure and with stage 5 chronic kidney disease, or end stage renal disease: Secondary | ICD-10-CM | POA: Diagnosis not present

## 2023-03-02 DIAGNOSIS — Z66 Do not resuscitate: Secondary | ICD-10-CM | POA: Diagnosis not present

## 2023-03-02 DIAGNOSIS — U071 COVID-19: Secondary | ICD-10-CM | POA: Diagnosis not present

## 2023-03-02 DIAGNOSIS — Z794 Long term (current) use of insulin: Secondary | ICD-10-CM | POA: Diagnosis not present

## 2023-03-02 DIAGNOSIS — G9341 Metabolic encephalopathy: Secondary | ICD-10-CM | POA: Diagnosis not present

## 2023-03-02 DIAGNOSIS — Z7189 Other specified counseling: Secondary | ICD-10-CM | POA: Diagnosis not present

## 2023-03-02 DIAGNOSIS — I441 Atrioventricular block, second degree: Secondary | ICD-10-CM | POA: Diagnosis not present

## 2023-03-02 DIAGNOSIS — Z7901 Long term (current) use of anticoagulants: Secondary | ICD-10-CM | POA: Diagnosis not present

## 2023-03-02 DIAGNOSIS — E119 Type 2 diabetes mellitus without complications: Secondary | ICD-10-CM | POA: Diagnosis not present

## 2023-03-02 DIAGNOSIS — F03918 Unspecified dementia, unspecified severity, with other behavioral disturbance: Secondary | ICD-10-CM | POA: Diagnosis not present

## 2023-03-02 LAB — CBC
HCT: 26.1 % — ABNORMAL LOW (ref 39.0–52.0)
HCT: 29.9 % — ABNORMAL LOW (ref 39.0–52.0)
Hemoglobin: 8.1 g/dL — ABNORMAL LOW (ref 13.0–17.0)
Hemoglobin: 9 g/dL — ABNORMAL LOW (ref 13.0–17.0)
MCH: 27.8 pg (ref 26.0–34.0)
MCH: 28 pg (ref 26.0–34.0)
MCHC: 31 g/dL (ref 30.0–36.0)
MCHC: 30.1 g/dL (ref 30.0–36.0)
MCV: 89.7 fL (ref 80.0–100.0)
MCV: 92.9 fL (ref 80.0–100.0)
Platelets: 159 10*3/uL (ref 150–400)
Platelets: 174 10*3/uL (ref 150–400)
RBC: 2.91 MIL/uL — ABNORMAL LOW (ref 4.22–5.81)
RBC: 3.22 MIL/uL — ABNORMAL LOW (ref 4.22–5.81)
RDW: 20.7 % — ABNORMAL HIGH (ref 11.5–15.5)
RDW: 20.9 % — ABNORMAL HIGH (ref 11.5–15.5)
WBC: 5.2 10*3/uL (ref 4.0–10.5)
WBC: 6.4 10*3/uL (ref 4.0–10.5)
nRBC: 0.8 % — ABNORMAL HIGH (ref 0.0–0.2)
nRBC: 0.5 % — ABNORMAL HIGH (ref 0.0–0.2)

## 2023-03-02 LAB — COMPREHENSIVE METABOLIC PANEL
ALT: 36 U/L (ref 0–44)
AST: 38 U/L (ref 15–41)
Albumin: 1.7 g/dL — ABNORMAL LOW (ref 3.5–5.0)
Alkaline Phosphatase: 115 U/L (ref 38–126)
Anion gap: 12 (ref 5–15)
BUN: 23 mg/dL (ref 8–23)
CO2: 28 mmol/L (ref 22–32)
Calcium: 9 mg/dL (ref 8.9–10.3)
Chloride: 95 mmol/L — ABNORMAL LOW (ref 98–111)
Creatinine, Ser: 6.59 mg/dL — ABNORMAL HIGH (ref 0.61–1.24)
GFR, Estimated: 8 mL/min — ABNORMAL LOW (ref >60)
Glucose, Bld: 204 mg/dL — ABNORMAL HIGH (ref 70–99)
Potassium: 4.3 mmol/L (ref 3.5–5.1)
Sodium: 135 mmol/L (ref 135–145)
Total Bilirubin: 0.9 mg/dL (ref 0.3–1.2)
Total Protein: 7.4 g/dL (ref 6.5–8.1)

## 2023-03-02 LAB — TROPONIN I (HIGH SENSITIVITY)
Troponin I (High Sensitivity): 113 ng/L (ref <18)
Troponin I (High Sensitivity): 99 ng/L — ABNORMAL HIGH (ref <18)
Troponin I (High Sensitivity): 99 ng/L — ABNORMAL HIGH (ref <18)

## 2023-03-02 LAB — GLUCOSE, CAPILLARY
Glucose-Capillary: 183 mg/dL — ABNORMAL HIGH (ref 70–99)
Glucose-Capillary: 169 mg/dL — ABNORMAL HIGH (ref 70–99)

## 2023-03-02 LAB — RENAL FUNCTION PANEL
Albumin: 1.6 g/dL — ABNORMAL LOW (ref 3.5–5.0)
Anion gap: 11 (ref 5–15)
BUN: 20 mg/dL (ref 8–23)
CO2: 27 mmol/L (ref 22–32)
Calcium: 8.4 mg/dL — ABNORMAL LOW (ref 8.9–10.3)
Chloride: 95 mmol/L — ABNORMAL LOW (ref 98–111)
Creatinine, Ser: 5.73 mg/dL — ABNORMAL HIGH (ref 0.61–1.24)
GFR, Estimated: 10 mL/min — ABNORMAL LOW (ref >60)
Glucose, Bld: 194 mg/dL — ABNORMAL HIGH (ref 70–99)
Phosphorus: 2.7 mg/dL (ref 2.5–4.6)
Potassium: 3.8 mmol/L (ref 3.5–5.1)
Sodium: 133 mmol/L — ABNORMAL LOW (ref 135–145)

## 2023-03-02 MED ORDER — RENA-VITE PO TABS: 1.0000 | ORAL_TABLET | Freq: Every day | ORAL | Status: DC

## 2023-03-02 MED ORDER — SODIUM CHLORIDE 0.9 % IV SOLN: INTRAVENOUS | Status: AC

## 2023-03-02 MED ORDER — ENSURE ENLIVE PO LIQD: 237.0000 mL | Freq: Two times a day (BID) | ORAL | Status: DC

## 2023-03-02 MED ORDER — GERHARDT'S BUTT CREAM: 1.0000 | TOPICAL_CREAM | Freq: Four times a day (QID) | CUTANEOUS | Status: DC

## 2023-03-02 MED ORDER — NITROGLYCERIN 0.4 MG SL SUBL: 0.4000 mg | SUBLINGUAL_TABLET | SUBLINGUAL | Status: DC | PRN

## 2023-03-02 MED ORDER — MIDODRINE HCL 10 MG PO TABS: 10.0000 mg | ORAL_TABLET | Freq: Three times a day (TID) | ORAL | Status: DC

## 2023-03-02 MED ORDER — ALUM & MAG HYDROXIDE-SIMETH 200-200-20 MG/5ML PO SUSP: 30.0000 mL | Freq: Four times a day (QID) | ORAL | Status: DC | PRN

## 2023-03-02 MED ORDER — MIDODRINE HCL 10 MG PO TABS: 10.0000 mg | ORAL_TABLET | ORAL | Status: DC | PRN

## 2023-03-02 MED ORDER — PANTOPRAZOLE SODIUM 40 MG PO TBEC: 40.0000 mg | DELAYED_RELEASE_TABLET | Freq: Two times a day (BID) | ORAL | Status: DC

## 2023-03-02 MED ORDER — SODIUM CHLORIDE 0.9 % IV SOLN: 3.0000 g | Freq: Two times a day (BID) | INTRAVENOUS | Status: AC

## 2023-03-02 MED ORDER — INSULIN ASPART 100 UNIT/ML IJ SOLN: INTRAMUSCULAR | Status: DC

## 2023-03-02 NOTE — Plan of Care (Signed)
Back from dialysis at 2100. Pt has been awake all night. Haldol given once with little relief. Pt constantly leaning over side rails. Trying to get OOB. Yells out frequently for people not here. Confused, confrontational and not wanting to follow directions. Not easily reoriented. HD cath intact. O2 2LNC placed back d/t sats 88%.Had large loose brown/green BM. Pt had all over the side rails. Patient, bed and rails cleaned.      Problem: Activity: Goal: Ability to tolerate increased activity will improve Outcome: Progressing   Problem: Respiratory: Goal: Ability to maintain a clear airway will improve Outcome: Progressing Goal: Levels of oxygenation will improve Outcome: Progressing Goal: Ability to maintain adequate ventilation will improve Outcome: Progressing   Problem: Fluid Volume: Goal: Ability to maintain a balanced intake and output will improve Outcome: Progressing   Problem: Metabolic: Goal: Ability to maintain appropriate glucose levels will improve Outcome: Progressing   Problem: Nutritional: Goal: Maintenance of adequate nutrition will improve Outcome: Progressing Goal: Progress toward achieving an optimal weight will improve Outcome: Progressing   Problem: Skin Integrity: Goal: Risk for impaired skin integrity will decrease Outcome: Progressing   Problem: Tissue Perfusion: Goal: Adequacy of tissue perfusion will improve Outcome: Progressing   Problem: Clinical Measurements: Goal: Ability to maintain clinical measurements within normal limits will improve Outcome: Progressing Goal: Will remain free from infection Outcome: Progressing Goal: Diagnostic test results will improve Outcome: Progressing Goal: Respiratory complications will improve Outcome: Progressing Goal: Cardiovascular complication will be avoided Outcome: Progressing   Problem: Activity: Goal: Risk for activity intolerance will decrease Outcome: Progressing   Problem: Nutrition: Goal:  Adequate nutrition will be maintained Outcome: Progressing   Problem: Elimination: Goal: Will not experience complications related to bowel motility Outcome: Progressing Goal: Will not experience complications related to urinary retention Outcome: Progressing   Problem: Pain Managment: Goal: General experience of comfort will improve Outcome: Progressing   Problem: Safety: Goal: Ability to remain free from injury will improve Outcome: Progressing   Problem: Skin Integrity: Goal: Risk for impaired skin integrity will decrease Outcome: Progressing

## 2023-03-02 NOTE — Progress Notes (Signed)
Removed internal jugular HD cath with no complications. Patient is staying on the bed for 30 minutes. Informed  patient's family & patient's RN regarding this matter. HS [employer]

## 2023-03-02 NOTE — Progress Notes (Signed)
[facility] Progress Note   Subjective:    Completed HD yesterday and was able to use AVF successfully. Net UF 3L. Temp cath still in place. No complaints this am, just hungry. Plans for discharge to [facility].   Objective Vitals:   03/01/23 2053 03/01/23 2313 03/02/23 0000 03/02/23 0825  BP:  (!) 95/55 118/84 (!) 110/91  Pulse:  99 97 (!) 112  Resp:  16 20 20   Temp:    98.5 F (36.9 C)  TempSrc:      SpO2:  94% 96% 94%  Weight: 88.8 kg     Height:       Physical Exam General: Awake and alert Heart: Normal rate, no rub Lungs: Chest rise no increased work of breathing Abdomen: soft, obese, Extremities: No pedal edema, warm and well-perfused LUA AVF +positive thrill  temp HD cath in place   [facility] Weights   02/27/23 1231 03/01/23 0500 03/01/23 2053  Weight: 92.5 kg 93.4 kg 88.8 kg    Intake/Output Summary (Last 24 hours) at 03/02/2023 1031 Last data filed at 03/02/2023 0600 Gross per 24 hour  Intake 577.76 ml  Output 3000 ml  Net -2422.24 ml    Additional Objective Labs: Basic Metabolic Panel: Recent Labs  Lab 02/25/23 0537 02/28/23 0129 03/01/23 0307 03/02/23 0210 03/02/23 0910  NA 135   < > 135 133* 135  K 3.3*   < > 4.2 3.8 4.3  CL 97*   < > 95* 95* 95*  CO2 26   < > 24 27 28   GLUCOSE 126*   < > 144* 194* 204*  BUN 12   < > 40* 20 23  CREATININE 3.48*   < > 8.74* 5.73* 6.59*  CALCIUM 7.5*   < > 9.0 8.4* 9.0  PHOS 2.5  --  5.0* 2.7  --    < > = values in this interval not displayed.   Liver Function Tests: Recent Labs  Lab 03/01/23 0307 03/02/23 0210 03/02/23 0910  AST  --   --  38  ALT  --   --  36  ALKPHOS  --   --  115  BILITOT  --   --  0.9  PROT  --   --  7.4  ALBUMIN 1.5* 1.6* 1.7*   No results for input(s): "LIPASE", "AMYLASE" in the last 168 hours. CBC: Recent Labs  Lab 02/25/23 0537 02/28/23 0129 03/01/23 0307 03/02/23 0210 03/02/23 0910  WBC 9.5 6.3 5.4 5.2 6.4  HGB 9.0* 8.6* 8.2* 8.1* 9.0*  HCT 28.3* 28.3*  26.9* 26.1* 29.9*  MCV 87.1 89.8 89.1 89.7 92.9  PLT 157 184 159 159 174   Blood Culture    Component Value Date/Time   SDES BLOOD RIGHT HAND 02/19/2023 1855   SPECREQUEST  02/19/2023 1855    BOTTLES DRAWN AEROBIC ONLY Blood Culture results may not be optimal due to an inadequate volume of blood received in culture bottles   CULT  02/19/2023 1855    NO GROWTH 5 DAYS Performed at [facility], [address], [place], [place] [address]    REPTSTATUS 02/24/2023 FINAL 02/19/2023 1855    Cardiac Enzymes: No results for input(s): "CKTOTAL", "CKMB", "CKMBINDEX", "TROPONINI" in the last 168 hours. CBG: Recent Labs  Lab 02/28/23 2108 03/01/23 0821 03/01/23 1221 03/01/23 2125 03/02/23 0845  GLUCAP 183* 166* 128* 158* 183*   Iron Studies: No results for input(s): "IRON", "TIBC", "TRANSFERRIN", "FERRITIN" in the last 72 hours. Lab Results  Component Value Date   INR 1.2 02/18/2023   INR 1.2 02/17/2023   INR 1.3 (H) 02/16/2023   Studies/Results: PERIPHERAL VASCULAR CATHETERIZATION  Result Date: 02/28/2023 Images from the original result were not included. Patient name: CORNELLIUS LIMPERT MRN: [id number] DOB: [birth year] Sex: male 02/28/2023 Pre-operative Diagnosis: Left upper extremity fistula function Post-operative diagnosis:  Same Surgeon:  Victorino Sparrow, MD Procedure Performed: 1.  Ultrasound-guided micropuncture access of the left brachiocephalic fistula 2.  Fistulogram 3.  Balloon venoplasty 6 x 80, 8 x 60, 7 x 40 4.  Drug-coated balloon venoplasty 7 x 40 mm x 2 Indications: Patient is a 72 year old male with end-stage renal disease requiring dialysis.  Last week, he had his peritoneal dialysis catheter removed due to infection.  Previous history of left-sided brachiocephalic fistula, that was working but has not been used in quite some time due to his peritoneal dialysis.  At last attempt, the fistula did not work appropriately.  After discussing the risks and benefits of  left-sided fistulogram with possible intervention, as well as tunneled dialysis catheter placement, Brently elected to proceed. Findings: Widely patent fistula anastomosis.  3 lesions along the fistula with greater than 60% stenosis, 2 in the upper arm, 1 at the axillary vein confluence. No central stenosis  Procedure:  The patient was identified in the holding area and taken to room 8.  The patient was then placed supine on the table and prepped and draped in the usual sterile fashion.  A time out was called.  Ultrasound was used to evaluate the left arteriovenous fistula.  An ultrasound-guided micropuncture needle was used to access the fistula in antegrade fashion.  Fistulogram followed.  See results above.  I elected to intervene on the 3 lesions.  The patient was heparinized, a 6 French sheath was placed.  A 6 x 80 mm balloon was used on all 3 lesions, followed by a 7 mm and 8 mm balloon.  There was continued wasting with the 8 mm balloon, therefore I downsized to a 7 x 40 mm balloon with wasting resolving at the axillary vein confluence with an inflation to 20 atm. Wasting at the mid arm and 2 locations improved dramatically to less than 30%.  I elected to use a 7 x 40 mm drug-coated balloon to help prevent further stenosis.  The initial balloon was inflated at the axillary vein confluence with an excellent result.  Unfortunately, Kerry tolerated the procedure rather poorly and stated that he was done and he wanted to stop the procedure immediately.  Therefore I did not use drug-coated balloon venoplasty on the other 2 upper arm lesions.  Follow-up venography demonstrated excellent result.  There is some tortuosity, but no flow-limiting stenosis appreciated.  Tej stated he did not want to move forward with tunneled dialysis catheter placement. I respected his wishes and terminated the case. The left-sided brachiocephalic fistula can be accessed.  Should there continue to be difficulties, he would need open  revision to decrease some of the tortuosity. Should the fistula malfunction, I am happy to place a tunneled dialysis catheter tomorrow afternoon in the operating room where Aaraf would tolerate it. Impression: Successful balloon angioplasty of left-sided brachiocephalic fistula.  The fistula can be accessed. Fara Olden, MD [facility]: [phone]    Medications:  sodium chloride Stopped (02/22/23 1238)   ampicillin-sulbactam (UNASYN) IV 3 g (03/02/23 0825)   micafungin (MYCAMINE) 200 mg in sodium chloride 0.9 % 100 mL IVPB 200  mg (03/02/23 0322)    aspirin EC  81 mg Oral Daily   Chlorhexidine Gluconate Cloth  6 each Topical Daily   clopidogrel  75 mg Oral Daily   darbepoetin (ARANESP) injection - DIALYSIS  100 mcg Subcutaneous Q Fri-1800   feeding supplement  237 mL Oral BID BM   Gerhardt's butt cream   Topical QID   heparin  5,000 Units Subcutaneous Q8H   insulin aspart  0-5 Units Subcutaneous QHS   insulin aspart  0-6 Units Subcutaneous TID WC   midodrine  10 mg Oral TID WC   mometasone-formoterol  2 puff Inhalation BID   multivitamin  1 tablet Oral QHS   pantoprazole  40 mg Oral BID   sodium chloride flush  3 mL Intravenous Q12H    Dialysis Orders (PD):   Followed by [facility] (note his PD RN was on vacation  but nephrology called [place] location for orders   [facility] - [facility] [place] [facility] - 100kg 5 cycles, 3000 fill vol (note pt states on consult he thinks is 2.5 liters), total time: 10 hrs, last fill 1500 cc Mircera 150 mcg q monthly Venofer 100mg  once a month L AVF +thrill but small + tortuous   CXR 8/05 - IMPRESSION: Persistent bibasilar opacities, which may represent atelectasis or pneumonia.   Home med --> albuterol, xanax, aspirin, aotrvastatin, clopidogrel, esomeprazole, gabapentin, insulin glargine, linagliptin, midodrine   Assessment/Plan: Peritonitis PD catheter related peritonitis -also with blood  cultures positive for fungus.  Followed by ID on antibiotics as well as micafungin.  PD catheter removed on 8/5.  Per ccm / ID.  Vol overload/ pulm edema -had diet assist on Fri and Sat.  Ongoing fluid removal with dialysis as tolerated.  Marland Kitchen   ESRD - pt rec'd PD initially, then PD cath pulled due to infection. Had HD 3x this past week, last one on Sat. TTS schedule this week-next HD this afternoon. Return to MWF schedule next week HD access - PD not working 8/2 --> IR placed temp HD catheter and underwent HD 8/3.  PD cath removed 8/05 due to fungal sepsis. Had iHD on 8/06 via temp cath then it was dc'd for line holiday. L arm AVF was not usable when we tried on 8/08 (infiltrated, tortuous, too deep?). Temp cath replaced 8/09 by CCM, line holiday completed and cultures were clear on 8/5. Discussed with ID today who is okay with permanent catheter placement. S/p F'gram:  AVF is widely patent. Temp cath to be removed. Completed HD using AVF on 8/15 Hypotension - chronic issue on home midodrine 5 tid. Here ^'d to 10mg  tid + 10mg  pre HD. Continue until we can get him to a new dry wt then can back off.  Anemia esrd - Hgb 9.0 improving on ESA  Secondary hyperparathyroidism -corrected calcium normal, binder stopped and phos now at goal. Dispo: -- To LTACH   Tomasa Blase PA-C [facility] 03/02/2023,10:32 AM

## 2023-03-02 NOTE — Progress Notes (Addendum)
Bedside nurse reported chest discomfort after eating dinner last night.  Will obtain a stat EKG which is unremarkable except evidence of chronic left bundle branch block. - Troponin 99 which has been improved since during this hospitalization 02/15/2023 when the troponin level was 573.  Concern for demand ischemia in the setting of acute hypoxic respiratory failure.  Patient also has history of CAD status post CABG November 2023.  Currently on DAPT (aspirin and Plavix). - Currently patient is chest pain-free.  Continue to monitor for redevelopment of any chest pain.  Trending troponin and will obtain EKG as needed.  Plan to continue aspirin and Plavix daily. -Starting sublingual nitroglycerin as needed. -I am concerned mostly heartburn rather than chest pain from cardiac origin.  Continue Protonix and ordered Maalox as needed  Tereasa Coop, MD [facility] 03/02/2023, 1:22 AM

## 2023-03-02 NOTE — Progress Notes (Addendum)
    Seems mentally clearer this morning.  Fistula was used for dialysis yesterday although still feels somewhat pulsatile.  If this is not going to be a long-term option we will place tunneled dialysis catheter prior to discharge and likely would require fistula revision with possible transposition in the future.  Available as needed  Tiegan Terpstra C. Randie Heinz, MD [facility] [place] Office: [phone] Pager: [phone]

## 2023-03-02 NOTE — Progress Notes (Signed)
Contacted [facility] to advise clinic that pt was d/c to [facility] today. Clinic advised that they will need to contact LTAC for updates,etc.   Olivia Canter Renal Navigator [phone]

## 2023-03-02 NOTE — Discharge Summary (Signed)
PATIENT DETAILS Name: [name] [name] Age: 72 y.o. Sex: male Date of Birth: 1951/05/29 MRN: [id number]. Admitting Physician: Tyrone Nine, MD BMW:[id number], Wayland Salinas, MD  Admit Date: 02/14/2023 Discharge date: 03/02/2023  Recommendations for Outpatient Follow-up:  Follow-up with nephrology/vascular surgery Continue Unasyn/micafungin through 8/21  Admitted From:  Home  Disposition: SELECT   Discharge Condition: good  CODE STATUS:   Code Status: Full Code   Diet recommendation:  Diet Order             Diet - low sodium heart healthy           Diet Carb Modified           Diet regular Room service appropriate? Yes with Assist; Fluid consistency: Thin; Fluid restriction: 1800 mL Fluid  Diet effective now                    Brief Summary: 72 year old with a history of CABG November 2023, COPD, DM2, GERD, chronic hypotension on midodrine, and ESRD on PD who was brought to the [facility] ER by his wife 02/15/2023 with significant confusion and progressive shortness of breath over a few days.  He was found to have sepsis secondary to peritoneal dialysis associated catheter peritonitis and polymicrobial bacteremia/fungemia.  Hospital course complicated by worsening encephalopathy with hypoxemia requiring transfer to the ICU.  See below for further details.   Significant Events: 7/31-8/1>> admit via [facility] ED for AMS - transferred to [facility]  8/3>> malfunction of PD catheter - R IJ temporary HD cath placed with HD performed 8/4>>decline in status post HD prompting PCCM consultation 8/5>> blood cultures positive for Candida glabrata and staph species 8/5>> PD catheter removed in OR 8/7>> encephalopathic/severe abdominal pain-not keeping his oxygen on-ICU consult as patient on 15 L of oxygen.   8/8>> line holiday. unable to cannulate AVF for HD. 8/9>> temp HD line placed.  8/13>> [facility] assumed care 8/14>> fistulogram-widely patent fistula-henceTDC not placed 8/15>> tolerated HD  through AV fistula  Brief Hospital Course: Acute metabolic encephalopathy Secondary to uremia/peritonitis Overall better-continues to improve every day-intermittently still confused.   Severe sepsis secondary to PD associated catheter peritonitis with Candida glabrata fungemia, enterococcal Avium/staph hemolyticus bacteremia. Sepsis physiology improved Source of infection likely PD catheter-now since removed ID followed closely-discussed with Dr. Zenaida Niece dam-8/16-plan is to continue -Unasyn + micafungin-14 days with 8/7 as day 1  TTE negative-Per ID note-no plans for TEE as this is transient bacteremia from peritonitis Ophthalmology eval negative for endophthalmitis (02/21/2023 note from Dr. Georga Hacking)    Acute hypoxic respiratory failure requiring BiPAP-likely due to volume overload in the setting of ESRD/suspected OHS/OSA Required BiPAP done in the ICU Overall stable-continue to slowly wean down FiO2 Require anywhere from 3 to 6 L but gradually being weaned down Volume removal with HD.     ESRD on home peritoneal dialysis Has been transitioned to HD Nephrology following closely Fistulogram on 8/14-widely patent fistula-thankfully-able to cannulate with HD on 8/15.  Discussed with nephrology team-Dr. Asencion Partridge to remove temporary HD catheter today-and okay to discharge to [facility]   Normocytic anemia Due to ESRD and critical illness Follow CBC and transfuse if significant drop.   Transaminitis -shock liver Follow LFTs   Chronic biventricular HFrEF Volume overload HD Volume status reasonably stable    CAD status post CABG November 2023 -demand myocardial ischemia Chronic LBBB on EKG - chest pain-free  DAPT   Hypokalemia Repleted   Hypomagnesemia Repleted   DM2 SSI  Obesity -  Body mass index is 33.23 kg/m.   Nutrition Status: Nutrition Problem: Increased nutrient needs Etiology: acute illness, chronic illness Signs/Symptoms: estimated needs Interventions: Ensure Enlive  (each supplement provides 350kcal and 20 grams of protein), MVI   Discharge Diagnoses:  Principal Problem:   Acute encephalopathy Active Problems:   Hyperlipidemia   Essential hypertension   GERD   OSA on CPAP   CAD (coronary artery disease)   Morbid obesity due to excess calories (HCC)   ESRD (end stage renal disease) (HCC)   NSTEMI (non-ST elevated myocardial infarction) (HCC)   S/P CABG x 2   Peritoneal dialysis catheter in place [facility])   Memory loss   Discharge Instructions:  Activity:  As tolerated with Full fall precautions use walker/cane & assistance as needed   Discharge Instructions     Call MD for:  difficulty breathing, headache or visual disturbances   Complete by: As directed    Call MD for:  extreme fatigue   Complete by: As directed    Call MD for:  persistant dizziness or light-headedness   Complete by: As directed    Diet - low sodium heart healthy   Complete by: As directed    Diet Carb Modified   Complete by: As directed    Discharge instructions   Complete by: As directed    Follow with Primary MD  Benetta Spar, MD in 1-2 weeks  Please get a complete blood count and chemistry panel checked by your Primary MD at your next visit, and again as instructed by your Primary MD.  Get Medicines reviewed and adjusted: Please take all your medications with you for your next visit with your Primary MD  Laboratory/radiological data: Please request your Primary MD to go over all hospital tests and procedure/radiological results at the follow up, please ask your Primary MD to get all Hospital records sent to his/her office.  In some cases, they will be blood work, cultures and biopsy results pending at the time of your discharge. Please request that your primary care M.D. follows up on these results.  Also Note the following: If you experience worsening of your admission symptoms, develop shortness of breath, life threatening emergency, suicidal or  homicidal thoughts you must seek medical attention immediately by calling 911 or calling your MD immediately  if symptoms less severe.  You must read complete instructions/literature along with all the possible adverse reactions/side effects for all the Medicines you take and that have been prescribed to you. Take any new Medicines after you have completely understood and accpet all the possible adverse reactions/side effects.   Do not drive when taking Pain medications or sleeping medications (Benzodaizepines)  Do not take more than prescribed Pain, Sleep and Anxiety Medications. It is not advisable to combine anxiety,sleep and pain medications without talking with your primary care practitioner  Special Instructions: If you have smoked or chewed Tobacco  in the last 2 yrs please stop smoking, stop any regular Alcohol  and or any Recreational drug use.  Wear Seat belts while driving.  Please note: You were cared for by a hospitalist during your hospital stay. Once you are discharged, your primary care physician will handle any further medical issues. Please note that NO REFILLS for any discharge medications will be authorized once you are discharged, as it is imperative that you return to your primary care physician (or establish a relationship with a primary care physician if you do not have one) for your post hospital discharge needs  so that they can reassess your need for medications and monitor your lab values.   Increase activity slowly   Complete by: As directed    No wound care   Complete by: As directed       Allergies as of 03/02/2023       Reactions   Opana [oxymorphone Hcl] Itching   Tramadol Itching        Medication List     STOP taking these medications    ALPRAZolam 0.25 MG tablet Commonly known as: Xanax   Droplet Pen Needles 31G X 5 MM Misc Generic drug: Insulin Pen Needle   esomeprazole 40 MG capsule Commonly known as: NEXIUM   gabapentin 100 MG  capsule Commonly known as: NEURONTIN   Lantus SoloStar 100 UNIT/ML Solostar Pen Generic drug: insulin glargine   linagliptin 5 MG Tabs tablet Commonly known as: Tradjenta   OneTouch Ultra test strip Generic drug: glucose blood   sevelamer 800 MG tablet Commonly known as: RENAGEL   Vitamin D (Ergocalciferol) 1.25 MG (50000 UNIT) Caps capsule Commonly known as: DRISDOL       TAKE these medications    albuterol 108 (90 Base) MCG/ACT inhaler Commonly known as: VENTOLIN HFA Inhale 2 puffs into the lungs every 6 (six) hours as needed for wheezing or shortness of breath.   Ampicillin-Sulbactam 3 g in sodium chloride 0.9 % 100 mL Inject 3 g into the vein every 12 (twelve) hours for 5 days.   aspirin EC 81 MG tablet Take 81 mg by mouth daily. Swallow whole.   atorvastatin 80 MG tablet Commonly known as: LIPITOR Take 1 tablet (80 mg total) by mouth daily.   clopidogrel 75 MG tablet Commonly known as: Plavix Take 1 tablet (75 mg total) by mouth daily. Please take first dose at 9 pm on 12/4   feeding supplement Liqd Take 237 mLs by mouth 2 (two) times daily between meals.   fluticasone 50 MCG/ACT nasal spray Commonly known as: FLONASE Place 1 spray into both nostrils as needed for allergies or rhinitis.   Gerhardt's butt cream Crea Apply 1 Application topically 4 (four) times daily.   insulin aspart 100 UNIT/ML injection Commonly known as: novoLOG 0-6 Units, Subcutaneous, 3 times daily with meals, CBG < 70: Implement Hypoglycemia measures CBG 70 - 120: 0 units CBG 121 - 150: 0 units CBG 151 - 200: 1 unit CBG 201-250: 2 units CBG 251-300: 3 units CBG 301-350: 4 units CBG 351-400: 5 units CBG > 400: Give 6 units and call MD   micafungin in sodium chloride 0.9 % 100 mL 200 mg intravenously every 24 hours Start taking on: March 03, 2023   midodrine 10 MG tablet Commonly known as: PROAMATINE Take 1 tablet (10 mg total) by mouth 3 (three) times daily with meals. What  changed:  medication strength how much to take   midodrine 10 MG tablet Commonly known as: PROAMATINE Take 1 tablet (10 mg total) by mouth every dialysis (give 30- 60 min prior to each HD session). What changed: You were already taking a medication with the same name, and this prescription was added. Make sure you understand how and when to take each.   multivitamin Tabs tablet Take 1 tablet by mouth at bedtime.   nitroGLYCERIN 0.4 MG SL tablet Commonly known as: NITROSTAT Place 1 tablet (0.4 mg total) under the tongue every 5 (five) minutes as needed for chest pain.   pantoprazole 40 MG tablet Commonly known as: PROTONIX Take 1  tablet (40 mg total) by mouth 2 (two) times daily.   Symbicort 160-4.5 MCG/ACT inhaler Generic drug: budesonide-formoterol INHALE 2 PUFFS INTO THE LUNGS TWICE DAILY.        Follow-up Information     [name], Wayland Salinas, MD. Schedule an appointment as soon as possible for a visit .   Specialty: Internal Medicine Contact information: [address] [place] [address] [phone]         [facility] Follow up.   Why: Call the office to arrange appointment for new dialysis access Contact information: [address] [place] [address] [phone]               Allergies  Allergen Reactions   Opana [Oxymorphone Hcl] Itching   Tramadol Itching     Other Procedures/Studies: PERIPHERAL VASCULAR CATHETERIZATION  Result Date: 02/28/2023 Images from the original result were not included. Patient name: [name] [name] MRN: [id number] DOB: 1951-01-28 Sex: male 02/28/2023 Pre-operative Diagnosis: Left upper extremity fistula function Post-operative diagnosis:  Same Surgeon:  Victorino Sparrow, MD Procedure Performed: 1.  Ultrasound-guided micropuncture access of the left brachiocephalic fistula 2.  Fistulogram 3.  Balloon venoplasty 6 x 80, 8 x 60, 7 x 40 4.  Drug-coated balloon venoplasty 7 x 40  mm x 2 Indications: Patient is a 72 year old male with end-stage renal disease requiring dialysis.  Last week, he had his peritoneal dialysis catheter removed due to infection.  Previous history of left-sided brachiocephalic fistula, that was working but has not been used in quite some time due to his peritoneal dialysis.  At last attempt, the fistula did not work appropriately.  After discussing the risks and benefits of left-sided fistulogram with possible intervention, as well as tunneled dialysis catheter placement, Draco elected to proceed. Findings: Widely patent fistula anastomosis.  3 lesions along the fistula with greater than 60% stenosis, 2 in the upper arm, 1 at the axillary vein confluence. No central stenosis  Procedure:  The patient was identified in the holding area and taken to room 8.  The patient was then placed supine on the table and prepped and draped in the usual sterile fashion.  A time out was called.  Ultrasound was used to evaluate the left arteriovenous fistula.  An ultrasound-guided micropuncture needle was used to access the fistula in antegrade fashion.  Fistulogram followed.  See results above.  I elected to intervene on the 3 lesions.  The patient was heparinized, a 6 French sheath was placed.  A 6 x 80 mm balloon was used on all 3 lesions, followed by a 7 mm and 8 mm balloon.  There was continued wasting with the 8 mm balloon, therefore I downsized to a 7 x 40 mm balloon with wasting resolving at the axillary vein confluence with an inflation to 20 atm. Wasting at the mid arm and 2 locations improved dramatically to less than 30%.  I elected to use a 7 x 40 mm drug-coated balloon to help prevent further stenosis.  The initial balloon was inflated at the axillary vein confluence with an excellent result.  Unfortunately, Isaish tolerated the procedure rather poorly and stated that he was done and he wanted to stop the procedure immediately.  Therefore I did not use drug-coated balloon  venoplasty on the other 2 upper arm lesions.  Follow-up venography demonstrated excellent result.  There is some tortuosity, but no flow-limiting stenosis appreciated.  Madeline stated he did not want to move forward with tunneled dialysis  catheter placement. I respected his wishes and terminated the case. The left-sided brachiocephalic fistula can be accessed.  Should there continue to be difficulties, he would need open revision to decrease some of the tortuosity. Should the fistula malfunction, I am happy to place a tunneled dialysis catheter tomorrow afternoon in the operating room where Finch would tolerate it. Impression: Successful balloon angioplasty of left-sided brachiocephalic fistula.  The fistula can be accessed. Fara Olden, MD [facility] Office: [phone]   DG CHEST PORT 1 VIEW  Result Date: 02/28/2023 CLINICAL DATA:  Hypoxia. EXAM: PORTABLE CHEST 1 VIEW COMPARISON:  02/23/2023 FINDINGS: Patchy mid and lower lung airspace disease again noted, similar to prior given differing technique. Lung volumes remain low. Cardiopericardial silhouette is at upper limits of normal for size. Right IJ catheter tip overlies the proximal SVC level. Telemetry leads overlie the chest. IMPRESSION: Low volume film with patchy mid and lower lung airspace disease, similar to prior. Electronically Signed   By: Kennith Center M.D.   On: 02/28/2023 07:59   DG CHEST PORT 1 VIEW  Result Date: 02/23/2023 CLINICAL DATA:  Dialysis/dialysis catheter care EXAM: PORTABLE CHEST 1 VIEW COMPARISON:  02/21/2023 FINDINGS: Right internal jugular central venous catheter tip: SVC. Prior CABG. The patient is rotated to the right on today's radiograph, reducing diagnostic sensitivity and specificity. Continued indistinct opacities at the lung bases, with interstitial predominance at the left lung base but some bandlike airspace opacities at the right lung base. Overall this is similar to previous. Suspected  mild underlying cardiomegaly. IMPRESSION: 1. Right internal jugular central venous catheter tip: SVC. No pneumothorax. 2. Continued indistinct opacities at the lung bases. 3. Suspected mild cardiomegaly. 4. Prior CABG. Electronically Signed   By: Gaylyn Rong M.D.   On: 02/23/2023 12:13   DG CHEST PORT 1 VIEW  Result Date: 02/21/2023 CLINICAL DATA:  72 year old male with history of tachypnea. EXAM: PORTABLE CHEST 1 VIEW COMPARISON:  Chest x-ray 02/19/2023. FINDINGS: Right internal jugular central venous catheter with tip terminating in the right atrium. Lung volumes are very low. Patchy areas of interstitial prominence, diffuse peribronchial cuffing, and ill-defined airspace disease noted throughout the lungs bilaterally, most evident in the mid to lower lungs. Small bilateral pleural effusions. No pneumothorax. Pulmonary vasculature is largely obscured. Heart size is mildly enlarged. Upper mediastinal contours are unremarkable. Status post median sternotomy for CABG. IMPRESSION: 1. The appearance of the chest is suggestive of multilobar bronchopneumonia, although some degree of underlying pulmonary edema is not excluded. 2. Small bilateral pleural effusions. 3. Mild cardiomegaly. Electronically Signed   By: Trudie Reed M.D.   On: 02/21/2023 06:41   ECHOCARDIOGRAM COMPLETE  Result Date: 02/19/2023    ECHOCARDIOGRAM REPORT   Patient Name:   JANCE PACKARD Date of Exam: 02/19/2023 Medical Rec #:  [id number]      Height:       66.0 in Accession #:    [id number]     Weight:       219.8 lb Date of Birth:  [birth year]     BSA:          2.082 m Patient Age:    71 years       BP:           110/93 mmHg Patient Gender: M              HR:           74 bpm. Exam Location:  Inpatient Procedure: 2D  Echo, Color Doppler, Cardiac Doppler and Intracardiac            Opacification Agent Indications:     Endocarditis  History:         Patient has prior history of Echocardiogram examinations, most                  recent  05/31/2022. Previous Myocardial Infarction and CAD,                  COPD, Arrythmias:NSTEMI; Risk Factors:Sleep Apnea, Diabetes and                  Hypertension.  Sonographer:     Raeford Razor Referring Phys:  Levy Pupa, S Diagnosing Phys: [facility]  Sonographer Comments: Technically difficult study due to poor echo windows. Image acquisition challenging due to patient body habitus. IMPRESSIONS  1. Akinesis of the anterior /anteroseptal/inferoseptal wall from base to apex. Left ventricular ejection fraction, by estimation, is 40 to 45%. The left ventricle has mildly decreased function. Left ventricular diastolic parameters are indeterminate.  2. Right ventricular systolic function is moderately reduced. The right ventricular size is normal.  3. Left atrial size was mildly dilated.  4. The mitral valve was not well visualized. No evidence of mitral valve regurgitation.  5. The aortic valve was not well visualized. Aortic valve regurgitation is not visualized. Mild aortic valve stenosis.  6. The inferior vena cava is normal in size with greater than 50% respiratory variability, suggesting right atrial pressure of 3 mmHg. Comparison(s): No significant change from prior study. FINDINGS  Left Ventricle: Akinesis of the anterior /anteroseptal/inferoseptal wall from base to apex. Left ventricular ejection fraction, by estimation, is 40 to 45%. The left ventricle has mildly decreased function. Definity contrast agent was given IV to delineate the left ventricular endocardial borders. The left ventricular internal cavity size was normal in size. There is no left ventricular hypertrophy. Left ventricular diastolic parameters are indeterminate. Right Ventricle: The right ventricular size is normal. Right ventricular systolic function is moderately reduced. Left Atrium: Left atrial size was mildly dilated. Right Atrium: Right atrial size was normal in size. Pericardium: There is no evidence of pericardial effusion. Mitral  Valve: The mitral valve was not well visualized. No evidence of mitral valve regurgitation. Tricuspid Valve: Tricuspid valve regurgitation is mild. Aortic Valve: The aortic valve was not well visualized. Aortic valve regurgitation is not visualized. Mild aortic stenosis is present. Aortic valve mean gradient measures 15.0 mmHg. Aortic valve peak gradient measures 27.2 mmHg. Aortic valve area, by VTI  measures 1.57 cm. Pulmonic Valve: Pulmonic valve regurgitation is not visualized. Aorta: The aortic root and ascending aorta are structurally normal, with no evidence of dilitation. Venous: The inferior vena cava is normal in size with greater than 50% respiratory variability, suggesting right atrial pressure of 3 mmHg. IAS/Shunts: No atrial level shunt detected by color flow Doppler.  LEFT VENTRICLE PLAX 2D LVIDd:         5.20 cm     Diastology LVIDs:         3.80 cm     LV e' medial:    10.70 cm/s LV PW:         1.00 cm     LV E/e' medial:  8.5 LV IVS:        1.00 cm     LV e' lateral:   8.49 cm/s LVOT diam:     2.00 cm     LV E/e' lateral:  10.7 LV SV:         68 LV SV Index:   33 LVOT Area:     3.14 cm  LV Volumes (MOD) LV vol d, MOD A2C: 95.4 ml LV vol d, MOD A4C: 49.6 ml LV vol s, MOD A2C: 47.0 ml LV vol s, MOD A4C: 24.7 ml LV SV MOD A2C:     48.4 ml LV SV MOD A4C:     49.6 ml LV SV MOD BP:      33.3 ml RIGHT VENTRICLE            IVC RV Basal diam:  3.30 cm    IVC diam: 1.20 cm RV S prime:     6.31 cm/s TAPSE (M-mode): 1.7 cm LEFT ATRIUM           Index        RIGHT ATRIUM           Index LA diam:      3.90 cm 1.87 cm/m   RA Area:     18.80 cm LA Vol (A4C): 82.5 ml 39.63 ml/m  RA Volume:   49.40 ml  23.73 ml/m  AORTIC VALVE AV Area (Vmax):    1.36 cm AV Area (Vmean):   1.26 cm AV Area (VTI):     1.57 cm AV Vmax:           261.00 cm/s AV Vmean:          181.000 cm/s AV VTI:            0.431 m AV Peak Grad:      27.2 mmHg AV Mean Grad:      15.0 mmHg LVOT Vmax:         113.00 cm/s LVOT Vmean:        72.600  cm/s LVOT VTI:          0.216 m LVOT/AV VTI ratio: 0.50  AORTA Ao Root diam: 3.00 cm Ao Asc diam:  3.10 cm MITRAL VALVE MV Area (PHT): 5.84 cm    SHUNTS MV Decel Time: 130 msec    Systemic VTI:  0.22 m MV E velocity: 91.20 cm/s  Systemic Diam: 2.00 cm Carolan Clines Electronically signed by Carolan Clines Signature Date/Time: 02/19/2023/6:44:33 PM    Final (Updated)    DG Chest Port 1 View  Result Date: 02/19/2023 CLINICAL DATA:  52841 Respiratory failure (HCC) 32440 EXAM: PORTABLE CHEST 1 VIEW COMPARISON:  02/17/2023 FINDINGS: Right IJ approach central venous catheter terminates at the level of the right atrium. Stable heart size status post sternotomy and CABG. Slightly low lung volumes. Persistent bibasilar opacities, similar to slightly progressed from prior. No pleural effusion or pneumothorax. IMPRESSION: Persistent bibasilar opacities, which may represent atelectasis or pneumonia. Electronically Signed   By: Duanne Guess D.O.   On: 02/19/2023 08:30   VAS [place] LOWER EXTREMITY VENOUS (DVT)  Result Date: 02/18/2023  Lower Venous DVT Study Patient Name:  KAYHAN SOUFFRONT [facility]  Date of Exam:   02/18/2023 Medical Rec #: [id number]       Accession #:    [id number] Date of Birth: Mar 18, 1951      Patient Gender: M Patient Age:   32 years Exam Location:  [facility] Procedure:      VAS [place] LOWER EXTREMITY VENOUS (DVT) Referring Phys: MURALI RAMASWAMY --------------------------------------------------------------------------------  Indications: Edema.  Comparison Study: No recent prior studies. Performing Technologist: Jean Rosenthal RDMS, RVT  Examination Guidelines: A complete evaluation includes B-mode imaging, spectral Doppler,  color Doppler, and power Doppler as needed of all accessible portions of each vessel. Bilateral testing is considered an integral part of a complete examination. Limited examinations for reoccurring indications may be performed as noted. The reflux portion of the exam is performed with the  patient in reverse Trendelenburg.  +---------+---------------+---------+-----------+----------+--------------+ RIGHT    CompressibilityPhasicitySpontaneityPropertiesThrombus Aging +---------+---------------+---------+-----------+----------+--------------+ CFV      Full           Yes      Yes                                 +---------+---------------+---------+-----------+----------+--------------+ SFJ      Full                                                        +---------+---------------+---------+-----------+----------+--------------+ FV Prox  Full                                                        +---------+---------------+---------+-----------+----------+--------------+ FV Mid   Full                                                        +---------+---------------+---------+-----------+----------+--------------+ FV DistalFull                                                        +---------+---------------+---------+-----------+----------+--------------+ PFV      Full                                                        +---------+---------------+---------+-----------+----------+--------------+ POP      Full           Yes      Yes                                 +---------+---------------+---------+-----------+----------+--------------+ PTV      Full                                                        +---------+---------------+---------+-----------+----------+--------------+ PERO     Full                                                        +---------+---------------+---------+-----------+----------+--------------+   +---------+---------------+---------+-----------+----------+--------------+  LEFT     CompressibilityPhasicitySpontaneityPropertiesThrombus Aging +---------+---------------+---------+-----------+----------+--------------+ CFV      Full           Yes      Yes                                  +---------+---------------+---------+-----------+----------+--------------+ SFJ      Full                                                        +---------+---------------+---------+-----------+----------+--------------+ FV Prox  Full                                                        +---------+---------------+---------+-----------+----------+--------------+ FV Mid   Full                                                        +---------+---------------+---------+-----------+----------+--------------+ FV DistalFull                                                        +---------+---------------+---------+-----------+----------+--------------+ PFV      Full                                                        +---------+---------------+---------+-----------+----------+--------------+ POP      Full           Yes      Yes                                 +---------+---------------+---------+-----------+----------+--------------+ PTV      Full                                                        +---------+---------------+---------+-----------+----------+--------------+ PERO     Full                                                        +---------+---------------+---------+-----------+----------+--------------+     Summary: RIGHT: - There is no evidence of deep vein thrombosis in the lower extremity.  - No cystic structure found in the popliteal fossa.  LEFT: - There is no evidence of deep vein thrombosis in the lower extremity.  - No  cystic structure found in the popliteal fossa.  *See table(s) above for measurements and observations. Electronically signed by Heath Lark on 02/18/2023 at 5:05:26 PM.    Final    EEG adult  Result Date: 02/18/2023 Windell Norfolk, MD     02/18/2023  9:49 AM Patient Name: [name] [name] MRN: [id number] Epilepsy Attending: Windell Norfolk Referring Physician/Provider: Dr. Delton Coombes Date: 02/18/2023 Duration: 23 minutes Patient  history: 31 year man with sepsis and AMS,  EEG to evaluate for seizure Level of alertness: lethargic AEDs during EEG study: None Technical aspects: This EEG study was done with scalp electrodes positioned according to the 10-20 International system of electrode placement. Electrical activity was reviewed with band pass filter of 1-70Hz , sensitivity of 7 uV/mm, display speed of 27mm/sec with a 60Hz  notched filter applied as appropriate. EEG data were recorded continuously and digitally stored.  Video monitoring was available and reviewed as appropriate. Description: EEG showed continuous/intermittent generalized polymorphic sharply contoured 3 to 6 Hz theta-delta slowing. Sleep was not seen. Hyperventilation and photic stimulation were not performed. No seizure or epileptiform discharges seen during this recording. ABNORMALITY - Continuous slow, generalized IMPRESSION: This study is suggestive of mild to moderate diffuse encephalopathy, nonspecific etiology but likely related to sedation, toxic-metabolic etiology. Amadou Camara   DG CHEST PORT 1 VIEW  Result Date: 02/17/2023 CLINICAL DATA:  Respiratory distress, shaking. EXAM: PORTABLE CHEST 1 VIEW COMPARISON:  02/16/2023. FINDINGS: The heart size and mediastinal contours are stable. There is atherosclerotic calcification of the aorta. Lung volumes are low and mild airspace disease is noted in the mid to lower lung fields bilaterally. No effusion or pneumothorax. A right internal jugular central venous catheter terminates over the right atrium. Sternotomy wires are present over the midline. IMPRESSION: 1. Mild airspace opacities in the mid to lower lung fields, possible multifocal pneumonia. 2. Right internal jugular central venous catheter terminating in the right atrium. Electronically Signed   By: Thornell Sartorius M.D.   On: 02/17/2023 23:56   IR Fluoro Guide CV Line Right  Result Date: 02/17/2023 INDICATION: Acute renal failure EXAM: Ultrasound fluoroscopy  guided temporary right IJ hemodialysis catheter placement MEDICATIONS: None ANESTHESIA/SEDATION: None FLUOROSCOPY: Radiation Exposure Index (as provided by the fluoroscopic device): 5 mGy Kerma COMPLICATIONS: None immediate. PROCEDURE: Informed written consent was obtained from the patient after a thorough discussion of the procedural risks, benefits and alternatives. All questions were addressed. Maximal Sterile Barrier Technique was utilized including caps, mask, sterile gowns, sterile gloves, sterile drape, hand hygiene and skin antiseptic. A timeout was performed prior to the initiation of the procedure. Right neck prepped and draped in the usual sterile fashion. All elements of maximal sterile barrier were utilized including, cap, mask, sterile gown, sterile gloves, large sterile drape, hand scrubbing and 2% Chlorhexidine for skin cleaning. The right internal jugular vein was evaluated with ultrasound and shown to be patent. A permanent ultrasound image was obtained and placed in the patient's medical record. Using sterile gel and a sterile probe cover, the right internal jugular vein was entered with a 21 ga needle during real time ultrasound guidance. 0.018 inch guidewire placed and 21 ga needle exchanged for transitional dilator set. Utilizing fluoroscopy, 0.035 inch guidewire advanced through the dilator without difficulty. Serial dilation performed, and catheter inserted over the guidewire. The tip was positioned in the right atrium. All lumens of the catheter aspirated and flushed well. The dialysis lumens were locked with Heparin. The catheter was secured to the skin with suture. The insertion site  was covered with sterile dressing. IMPRESSION: Temporary right IJ hemodialysis catheter (20 cm) is ready for use. Electronically Signed   By: Acquanetta Belling M.D.   On: 02/17/2023 11:25   IR US Guide Vasc Access Right  Result Date: 02/17/2023 INDICATION: Acute renal failure EXAM: Ultrasound fluoroscopy guided  temporary right IJ hemodialysis catheter placement MEDICATIONS: None ANESTHESIA/SEDATION: None FLUOROSCOPY: Radiation Exposure Index (as provided by the fluoroscopic device): 5 mGy Kerma COMPLICATIONS: None immediate. PROCEDURE: Informed written consent was obtained from the patient after a thorough discussion of the procedural risks, benefits and alternatives. All questions were addressed. Maximal Sterile Barrier Technique was utilized including caps, mask, sterile gowns, sterile gloves, sterile drape, hand hygiene and skin antiseptic. A timeout was performed prior to the initiation of the procedure. Right neck prepped and draped in the usual sterile fashion. All elements of maximal sterile barrier were utilized including, cap, mask, sterile gown, sterile gloves, large sterile drape, hand scrubbing and 2% Chlorhexidine for skin cleaning. The right internal jugular vein was evaluated with ultrasound and shown to be patent. A permanent ultrasound image was obtained and placed in the patient's medical record. Using sterile gel and a sterile probe cover, the right internal jugular vein was entered with a 21 ga needle during real time ultrasound guidance. 0.018 inch guidewire placed and 21 ga needle exchanged for transitional dilator set. Utilizing fluoroscopy, 0.035 inch guidewire advanced through the dilator without difficulty. Serial dilation performed, and catheter inserted over the guidewire. The tip was positioned in the right atrium. All lumens of the catheter aspirated and flushed well. The dialysis lumens were locked with Heparin. The catheter was secured to the skin with suture. The insertion site was covered with sterile dressing. IMPRESSION: Temporary right IJ hemodialysis catheter (20 cm) is ready for use. Electronically Signed   By: Acquanetta Belling M.D.   On: 02/17/2023 11:25   DG Abd 1 View  Result Date: 02/17/2023 CLINICAL DATA:  End-stage renal disease. EXAM: ABDOMEN - 1 VIEW COMPARISON:  No comparison  studies available. FINDINGS: The bowel gas pattern is normal. No radio-opaque calculi or other significant radiographic abnormality are seen. Peritoneal dialysis catheter overlies the lower abdomen. IMPRESSION: Negative. Electronically Signed   By: Kennith Center M.D.   On: 02/17/2023 10:41   DG Chest Port 1 View  Result Date: 02/16/2023 CLINICAL DATA:  Shortness of breath. COPD and end-stage kidney disease. EXAM: PORTABLE CHEST 1 VIEW COMPARISON:  02/14/23 FINDINGS: Stable cardiac enlargement. Status post median sternotomy and CABG procedure. Blunting of the left costophrenic angle is concerning for small effusion. Bilateral mid and lower lung zone hazy opacities are identified compatible with multifocal pneumonia as noted on CT from prior day. Visualized osseous structures are unremarkable. IMPRESSION: 1. Bilateral mid and lower lung zone hazy opacities compatible with multifocal pneumonia as noted on CT from prior day. 2. Small left pleural effusion. Electronically Signed   By: Signa Kell M.D.   On: 02/16/2023 07:06   US Abdomen Limited RUQ (LIVER/GB)  Result Date: 02/15/2023 CLINICAL DATA:  Transaminitis. EXAM: ULTRASOUND ABDOMEN LIMITED RIGHT UPPER QUADRANT COMPARISON:  CT scan of same day. FINDINGS: Gallbladder: No gallstones or wall thickening visualized. No sonographic Murphy sign noted by sonographer. Possible 3 mm polyp. Common bile duct: Diameter: 3 mm which is within normal limits. Liver: No focal lesion identified. Heterogeneous echotexture of hepatic parenchyma is noted with nodular contours consistent with hepatic cirrhosis. Portal vein is patent on color Doppler imaging with normal direction of blood flow towards  the liver. Other: Mild ascites is noted around the liver. IMPRESSION: Hepatic cirrhosis with mild surrounding ascites. Possible 3 mm gallbladder polyp. Electronically Signed   By: Lupita Raider M.D.   On: 02/15/2023 10:31   CT ABDOMEN PELVIS WO CONTRAST  Result Date:  02/15/2023 CLINICAL DATA:  Epigastric abdominal pain. EXAM: CT ABDOMEN AND PELVIS WITHOUT CONTRAST TECHNIQUE: Multidetector CT imaging of the abdomen and pelvis was performed following the standard protocol without IV contrast. RADIATION DOSE REDUCTION: This exam was performed according to the departmental dose-optimization program which includes automated exposure control, adjustment of the mA and/or kV according to patient size and/or use of iterative reconstruction technique. COMPARISON:  September 27, 2022. FINDINGS: Lower chest: Multiple patchy opacities are noted in both lower lobes concerning for multifocal pneumonia. Hepatobiliary: No cholelithiasis or biliary dilatation is noted. Stable probable hepatic cysts. Slightly nodular hepatic contour is noted suggesting possible hepatic cirrhosis. Pancreas: Unremarkable. No pancreatic ductal dilatation or surrounding inflammatory changes. Spleen: Normal in size without focal abnormality. Adrenals/Urinary Tract: Adrenal glands appear normal. Bilateral renal atrophy is noted consistent with history of end-stage renal disease. Grossly stable high density material is noted within both intrarenal collecting systems as well as in the proximal ureters. It is uncertain if these represent calcifications or residual contrast material. Stable complex cystic lesion seen involving lower pole of left kidney for which no further follow-up is required. Urinary bladder is decompressed. Stomach/Bowel: There is no evidence of bowel obstruction or inflammation. Stomach is unremarkable. The appendix is not clearly visualized. Vascular/Lymphatic: Aortic atherosclerosis. No enlarged abdominal or pelvic lymph nodes. Reproductive: Stable mild prostatic enlargement. Penile reservoir is noted in right lower quadrant. Other: Peritoneal dialysis catheter is noted with tip in left lower quadrant. Mild amount of free fluid is noted in the abdomen and pelvis, but most prominently seen in right upper  quadrant. Musculoskeletal: No acute or significant osseous findings. IMPRESSION: Multiple patchy airspace opacities are seen in visualized lung bases concerning for multifocal pneumonia. Bilateral renal atrophy is noted consistent with history of end-stage renal disease. Grossly stable high density material is noted within both nondilated intrarenal collecting systems as well as proximal ureters, concerning for either calcifications or residual contrast material as described on prior exam. Probable hepatic cirrhosis. Peritoneal dialysis catheter tip is noted in left lower quadrant. Mild amount of free fluid is noted in the abdomen and pelvis, but most prominently seen in right upper quadrant. Stable mild prostatic enlargement. Aortic Atherosclerosis (ICD10-I70.0). Electronically Signed   By: Lupita Raider M.D.   On: 02/15/2023 09:04   CT HEAD WO CONTRAST ( )  Result Date: 02/15/2023 CLINICAL DATA:  Mental status change with unknown cause.  COVID EXAM: CT HEAD WITHOUT CONTRAST TECHNIQUE: Contiguous axial images were obtained from the base of the skull through the vertex without intravenous contrast. RADIATION DOSE REDUCTION: This exam was performed according to the departmental dose-optimization program which includes automated exposure control, adjustment of the mA and/or kV according to patient size and/or use of iterative reconstruction technique. COMPARISON:  None Available. FINDINGS: Brain: No evidence of acute infarction, hemorrhage, hydrocephalus, extra-axial collection or mass lesion/mass effect. Generalized atrophy. Mild chronic small vessel ischemia in the cerebral white matter. Vascular: No hyperdense vessel or unexpected calcification. Skull: Normal. Negative for fracture or focal lesion. Sinuses/Orbits: Remote blowout fracture at the right orbit. IMPRESSION: 1. No acute or reversible finding. 2. Moderate atrophy. Electronically Signed   By: Tiburcio Pea M.D.   On: 02/15/2023 07:14   DG Chest  2 View  Result Date: 02/14/2023 CLINICAL DATA:  Cough EXAM: CHEST - 2 VIEW COMPARISON:  Chest x-ray 09/16/2022 FINDINGS: Sternotomy wires are again seen. The heart size and mediastinal contours are within normal limits. Both lungs are clear. The visualized skeletal structures are unremarkable. IMPRESSION: No active cardiopulmonary disease. Electronically Signed   By: Darliss Cheney M.D.   On: 02/14/2023 23:30     TODAY-DAY OF DISCHARGE:  Subjective:   Jacqualine Mau today has no headache,no chest abdominal pain,no new weakness tingling or numbness, feels much better wants to go home today.   Objective:   Blood pressure (!) 110/91, pulse (!) 112, temperature 98.5 F (36.9 C), resp. rate 20, height 5\' 6"  (1.676 m), weight 88.8 kg, SpO2 94%.  Intake/Output Summary (Last 24 hours) at 03/02/2023 1007 Last data filed at 03/02/2023 0600 Gross per 24 hour  Intake 577.76 ml  Output 3000 ml  Net -2422.24 ml   Filed Weights   02/27/23 1231 03/01/23 0500 03/01/23 2053  Weight: 92.5 kg 93.4 kg 88.8 kg    Exam: Awake Alert, Oriented *3, No new F.N deficits, Normal affect [place].AT,PERRAL Supple Neck,No JVD, No cervical lymphadenopathy appriciated.  Symmetrical Chest wall movement, Good air movement bilaterally, CTAB RRR,No Gallops,Rubs or new Murmurs, No Parasternal Heave +ve B.Sounds, Abd Soft, Non tender, No organomegaly appriciated, No rebound -guarding or rigidity. No Cyanosis, Clubbing or edema, No new Rash or bruise   PERTINENT RADIOLOGIC STUDIES: PERIPHERAL VASCULAR CATHETERIZATION  Result Date: 02/28/2023 Images from the original result were not included. Patient name: [name] [name] MRN: [id number] DOB: June 19, 1951 Sex: male 02/28/2023 Pre-operative Diagnosis: Left upper extremity fistula function Post-operative diagnosis:  Same Surgeon:  Victorino Sparrow, MD Procedure Performed: 1.  Ultrasound-guided micropuncture access of the left brachiocephalic fistula 2.  Fistulogram 3.  Balloon  venoplasty 6 x 80, 8 x 60, 7 x 40 4.  Drug-coated balloon venoplasty 7 x 40 mm x 2 Indications: Patient is a 72 year old male with end-stage renal disease requiring dialysis.  Last week, he had his peritoneal dialysis catheter removed due to infection.  Previous history of left-sided brachiocephalic fistula, that was working but has not been used in quite some time due to his peritoneal dialysis.  At last attempt, the fistula did not work appropriately.  After discussing the risks and benefits of left-sided fistulogram with possible intervention, as well as tunneled dialysis catheter placement, Charlene elected to proceed. Findings: Widely patent fistula anastomosis.  3 lesions along the fistula with greater than 60% stenosis, 2 in the upper arm, 1 at the axillary vein confluence. No central stenosis  Procedure:  The patient was identified in the holding area and taken to room 8.  The patient was then placed supine on the table and prepped and draped in the usual sterile fashion.  A time out was called.  Ultrasound was used to evaluate the left arteriovenous fistula.  An ultrasound-guided micropuncture needle was used to access the fistula in antegrade fashion.  Fistulogram followed.  See results above.  I elected to intervene on the 3 lesions.  The patient was heparinized, a 6 French sheath was placed.  A 6 x 80 mm balloon was used on all 3 lesions, followed by a 7 mm and 8 mm balloon.  There was continued wasting with the 8 mm balloon, therefore I downsized to a 7 x 40 mm balloon with wasting resolving at the axillary vein confluence with an inflation to 20 atm. Wasting at the mid arm and 2  locations improved dramatically to less than 30%.  I elected to use a 7 x 40 mm drug-coated balloon to help prevent further stenosis.  The initial balloon was inflated at the axillary vein confluence with an excellent result.  Unfortunately, Imraan tolerated the procedure rather poorly and stated that he was done and he wanted to  stop the procedure immediately.  Therefore I did not use drug-coated balloon venoplasty on the other 2 upper arm lesions.  Follow-up venography demonstrated excellent result.  There is some tortuosity, but no flow-limiting stenosis appreciated.  Tycen stated he did not want to move forward with tunneled dialysis catheter placement. I respected his wishes and terminated the case. The left-sided brachiocephalic fistula can be accessed.  Should there continue to be difficulties, he would need open revision to decrease some of the tortuosity. Should the fistula malfunction, I am happy to place a tunneled dialysis catheter tomorrow afternoon in the operating room where Emery would tolerate it. Impression: Successful balloon angioplasty of left-sided brachiocephalic fistula.  The fistula can be accessed. Fara Olden, MD [facility] Office: [phone]     PERTINENT LAB RESULTS: CBC: Recent Labs    03/02/23 0210 03/02/23 0910  WBC 5.2 6.4  HGB 8.1* 9.0*  HCT 26.1* 29.9*  PLT 159 174   CMET CMP     Component Value Date/Time   NA 133 (L) 03/02/2023 0210   NA 136 12/22/2021 1017   K 3.8 03/02/2023 0210   CL 95 (L) 03/02/2023 0210   CO2 27 03/02/2023 0210   GLUCOSE 194 (H) 03/02/2023 0210   BUN 20 03/02/2023 0210   BUN 58 (H) 12/22/2021 1017   CREATININE 5.73 (H) 03/02/2023 0210   CREATININE 18.89 (H) 02/09/2020 1126   CALCIUM 8.4 (L) 03/02/2023 0210   PROT 5.1 (L) 02/21/2023 0407   ALBUMIN 1.6 (L) 03/02/2023 0210   AST 75 (H) 02/21/2023 0407   ALT 178 (H) 02/21/2023 0407   ALKPHOS 87 02/21/2023 0407   BILITOT 1.2 02/21/2023 0407   EGFR 3 (L) 12/22/2021 1017   GFRNONAA 10 (L) 03/02/2023 0210   GFRNONAA 2 (L) 02/09/2020 1126    GFR Estimated Creatinine Clearance: 12.2 mL/min (A) (by C-G formula based on SCr of 5.73 mg/dL (H)). No results for input(s): "LIPASE", "AMYLASE" in the last 72 hours. No results for input(s): "CKTOTAL", "CKMB", "CKMBINDEX",  "TROPONINI" in the last 72 hours. Invalid input(s): "POCBNP" No results for input(s): "DDIMER" in the last 72 hours. No results for input(s): "HGBA1C" in the last 72 hours. No results for input(s): "CHOL", "HDL", "LDLCALC", "TRIG", "CHOLHDL", "LDLDIRECT" in the last 72 hours. No results for input(s): "TSH", "T4TOTAL", "T3FREE", "THYROIDAB" in the last 72 hours.  Invalid input(s): "FREET3" No results for input(s): "VITAMINB12", "FOLATE", "FERRITIN", "TIBC", "IRON", "RETICCTPCT" in the last 72 hours. Coags: No results for input(s): "INR" in the last 72 hours.  Invalid input(s): "PT" Microbiology: No results found for this or any previous visit (from the past 240 hour(s)).  FURTHER DISCHARGE INSTRUCTIONS:  Get Medicines reviewed and adjusted: Please take all your medications with you for your next visit with your Primary MD  Laboratory/radiological data: Please request your Primary MD to go over all hospital tests and procedure/radiological results at the follow up, please ask your Primary MD to get all Hospital records sent to his/her office.  In some cases, they will be blood work, cultures and biopsy results pending at the time of your discharge. Please request that your primary  care M.D. goes through all the records of your hospital data and follows up on these results.  Also Note the following: If you experience worsening of your admission symptoms, develop shortness of breath, life threatening emergency, suicidal or homicidal thoughts you must seek medical attention immediately by calling 911 or calling your MD immediately  if symptoms less severe.  You must read complete instructions/literature along with all the possible adverse reactions/side effects for all the Medicines you take and that have been prescribed to you. Take any new Medicines after you have completely understood and accpet all the possible adverse reactions/side effects.   Do not drive when taking Pain medications  or sleeping medications (Benzodaizepines)  Do not take more than prescribed Pain, Sleep and Anxiety Medications. It is not advisable to combine anxiety,sleep and pain medications without talking with your primary care practitioner  Special Instructions: If you have smoked or chewed Tobacco  in the last 2 yrs please stop smoking, stop any regular Alcohol  and or any Recreational drug use.  Wear Seat belts while driving.  Please note: You were cared for by a hospitalist during your hospital stay. Once you are discharged, your primary care physician will handle any further medical issues. Please note that NO REFILLS for any discharge medications will be authorized once you are discharged, as it is imperative that you return to your primary care physician (or establish a relationship with a primary care physician if you do not have one) for your post hospital discharge needs so that they can reassess your need for medications and monitor your lab values.  Total Time spent coordinating discharge including counseling, education and face to face time equals greater than 30 minutes.  SignedJeoffrey Massed 03/02/2023 10:07 AM

## 2023-03-03 LAB — COMPREHENSIVE METABOLIC PANEL
ALT: 29 U/L (ref 0–44)
AST: 27 U/L (ref 15–41)
Albumin: 1.5 g/dL — ABNORMAL LOW (ref 3.5–5.0)
Alkaline Phosphatase: 92 U/L (ref 38–126)
Anion gap: 15 (ref 5–15)
BUN: 34 mg/dL — ABNORMAL HIGH (ref 8–23)
CO2: 26 mmol/L (ref 22–32)
Calcium: 8.7 mg/dL — ABNORMAL LOW (ref 8.9–10.3)
Chloride: 94 mmol/L — ABNORMAL LOW (ref 98–111)
Creatinine, Ser: 8.26 mg/dL — ABNORMAL HIGH (ref 0.61–1.24)
GFR, Estimated: 6 mL/min — ABNORMAL LOW (ref >60)
Glucose, Bld: 121 mg/dL — ABNORMAL HIGH (ref 70–99)
Potassium: 3.8 mmol/L (ref 3.5–5.1)
Sodium: 135 mmol/L (ref 135–145)
Total Bilirubin: 0.5 mg/dL (ref 0.3–1.2)
Total Protein: 6.2 g/dL — ABNORMAL LOW (ref 6.5–8.1)

## 2023-03-03 LAB — TROPONIN I (HIGH SENSITIVITY): Troponin I (High Sensitivity): 97 ng/L — ABNORMAL HIGH (ref <18)

## 2023-03-03 LAB — PROTIME-INR
INR: 1.1 (ref 0.8–1.2)
Prothrombin Time: 14.3 s (ref 11.4–15.2)

## 2023-03-03 LAB — AMMONIA: Ammonia: 19 umol/L (ref 9–35)

## 2023-03-04 LAB — BASIC METABOLIC PANEL
Anion gap: 11 (ref 5–15)
BUN: 14 mg/dL (ref 8–23)
CO2: 25 mmol/L (ref 22–32)
Calcium: 8.2 mg/dL — ABNORMAL LOW (ref 8.9–10.3)
Chloride: 101 mmol/L (ref 98–111)
Creatinine, Ser: 5.16 mg/dL — ABNORMAL HIGH (ref 0.61–1.24)
GFR, Estimated: 11 mL/min — ABNORMAL LOW (ref >60)
Glucose, Bld: 175 mg/dL — ABNORMAL HIGH (ref 70–99)
Potassium: 4 mmol/L (ref 3.5–5.1)
Sodium: 137 mmol/L (ref 135–145)

## 2023-03-04 LAB — HEMOGLOBIN A1C
Hgb A1c MFr Bld: 6.3 % — ABNORMAL HIGH (ref 4.8–5.6)
Mean Plasma Glucose: 134.11 mg/dL

## 2023-03-04 LAB — AMMONIA: Ammonia: 33 umol/L (ref 9–35)

## 2023-03-05 ENCOUNTER — Encounter (HOSPITAL_COMMUNITY): Payer: Medicare Other

## 2023-03-06 ENCOUNTER — Encounter (HOSPITAL_COMMUNITY): Payer: Medicare Other | Admitting: Occupational Therapy

## 2023-03-06 ENCOUNTER — Other Ambulatory Visit (HOSPITAL_COMMUNITY): Payer: Medicare Other

## 2023-03-06 LAB — RENAL FUNCTION PANEL
Albumin: 1.7 g/dL — ABNORMAL LOW (ref 3.5–5.0)
Anion gap: 15 (ref 5–15)
BUN: 41 mg/dL — ABNORMAL HIGH (ref 8–23)
CO2: 22 mmol/L (ref 22–32)
Calcium: 9.1 mg/dL (ref 8.9–10.3)
Chloride: 95 mmol/L — ABNORMAL LOW (ref 98–111)
Creatinine, Ser: 9.83 mg/dL — ABNORMAL HIGH (ref 0.61–1.24)
GFR, Estimated: 5 mL/min — ABNORMAL LOW (ref >60)
Glucose, Bld: 128 mg/dL — ABNORMAL HIGH (ref 70–99)
Phosphorus: 6 mg/dL — ABNORMAL HIGH (ref 2.5–4.6)
Potassium: 4.8 mmol/L (ref 3.5–5.1)
Sodium: 132 mmol/L — ABNORMAL LOW (ref 135–145)

## 2023-03-06 LAB — CBC WITH DIFFERENTIAL/PLATELET
Abs Immature Granulocytes: 0.06 10*3/uL (ref 0.00–0.07)
Basophils Absolute: 0.1 10*3/uL (ref 0.0–0.1)
Basophils Relative: 1 %
Eosinophils Absolute: 0.2 10*3/uL (ref 0.0–0.5)
Eosinophils Relative: 4 %
HCT: 28.8 % — ABNORMAL LOW (ref 39.0–52.0)
Hemoglobin: 8.6 g/dL — ABNORMAL LOW (ref 13.0–17.0)
Immature Granulocytes: 1 %
Lymphocytes Relative: 10 %
Lymphs Abs: 0.5 10*3/uL — ABNORMAL LOW (ref 0.7–4.0)
MCH: 27.5 pg (ref 26.0–34.0)
MCHC: 29.9 g/dL — ABNORMAL LOW (ref 30.0–36.0)
MCV: 92 fL (ref 80.0–100.0)
Monocytes Absolute: 0.6 10*3/uL (ref 0.1–1.0)
Monocytes Relative: 13 %
Neutro Abs: 3.6 10*3/uL (ref 1.7–7.7)
Neutrophils Relative %: 71 %
Platelets: 167 10*3/uL (ref 150–400)
RBC: 3.13 MIL/uL — ABNORMAL LOW (ref 4.22–5.81)
RDW: 19.9 % — ABNORMAL HIGH (ref 11.5–15.5)
WBC: 5.1 10*3/uL (ref 4.0–10.5)
nRBC: 0.4 % — ABNORMAL HIGH (ref 0.0–0.2)

## 2023-03-07 ENCOUNTER — Encounter (HOSPITAL_COMMUNITY): Payer: Medicare Other

## 2023-03-07 LAB — D-DIMER, QUANTITATIVE: D-Dimer, Quant: 5.51 ug{FEU}/mL — ABNORMAL HIGH (ref 0.00–0.50)

## 2023-03-08 LAB — CBC WITH DIFFERENTIAL/PLATELET
Abs Immature Granulocytes: 0.07 10*3/uL (ref 0.00–0.07)
Basophils Absolute: 0.1 10*3/uL (ref 0.0–0.1)
Basophils Relative: 1 %
Eosinophils Absolute: 0.2 10*3/uL (ref 0.0–0.5)
Eosinophils Relative: 5 %
HCT: 25.2 % — ABNORMAL LOW (ref 39.0–52.0)
Hemoglobin: 7.8 g/dL — ABNORMAL LOW (ref 13.0–17.0)
Immature Granulocytes: 2 %
Lymphocytes Relative: 8 %
Lymphs Abs: 0.3 10*3/uL — ABNORMAL LOW (ref 0.7–4.0)
MCH: 28.5 pg (ref 26.0–34.0)
MCHC: 31 g/dL (ref 30.0–36.0)
MCV: 92 fL (ref 80.0–100.0)
Monocytes Absolute: 0.6 10*3/uL (ref 0.1–1.0)
Monocytes Relative: 16 %
Neutro Abs: 2.8 10*3/uL (ref 1.7–7.7)
Neutrophils Relative %: 68 %
Platelets: 156 10*3/uL (ref 150–400)
RBC: 2.74 MIL/uL — ABNORMAL LOW (ref 4.22–5.81)
RDW: 19.7 % — ABNORMAL HIGH (ref 11.5–15.5)
WBC: 4.1 10*3/uL (ref 4.0–10.5)
nRBC: 0 % (ref 0.0–0.2)

## 2023-03-08 LAB — APTT: aPTT: 41 s — ABNORMAL HIGH (ref 24–36)

## 2023-03-08 LAB — PROTIME-INR
INR: 1 (ref 0.8–1.2)
Prothrombin Time: 13.2 s (ref 11.4–15.2)

## 2023-03-08 LAB — RENAL FUNCTION PANEL
Albumin: <1.5 g/dL — ABNORMAL LOW (ref 3.5–5.0)
Anion gap: 14 (ref 5–15)
BUN: 34 mg/dL — ABNORMAL HIGH (ref 8–23)
CO2: 23 mmol/L (ref 22–32)
Calcium: 8.5 mg/dL — ABNORMAL LOW (ref 8.9–10.3)
Chloride: 97 mmol/L — ABNORMAL LOW (ref 98–111)
Creatinine, Ser: 7.98 mg/dL — ABNORMAL HIGH (ref 0.61–1.24)
GFR, Estimated: 7 mL/min — ABNORMAL LOW (ref >60)
Glucose, Bld: 155 mg/dL — ABNORMAL HIGH (ref 70–99)
Phosphorus: 4.9 mg/dL — ABNORMAL HIGH (ref 2.5–4.6)
Potassium: 3.7 mmol/L (ref 3.5–5.1)
Sodium: 134 mmol/L — ABNORMAL LOW (ref 135–145)

## 2023-03-08 LAB — SEDIMENTATION RATE: Sed Rate: >140 mm/h — ABNORMAL HIGH (ref 0–16)

## 2023-03-08 LAB — C-REACTIVE PROTEIN: CRP: 7.8 mg/dL — ABNORMAL HIGH (ref <1.0)

## 2023-03-09 ENCOUNTER — Encounter (HOSPITAL_COMMUNITY): Payer: Medicare Other

## 2023-03-09 ENCOUNTER — Other Ambulatory Visit (HOSPITAL_COMMUNITY): Payer: Medicare Other

## 2023-03-09 LAB — BASIC METABOLIC PANEL
Anion gap: 12 (ref 5–15)
BUN: 40 mg/dL — ABNORMAL HIGH (ref 8–23)
CO2: 24 mmol/L (ref 22–32)
Calcium: 8.4 mg/dL — ABNORMAL LOW (ref 8.9–10.3)
Chloride: 96 mmol/L — ABNORMAL LOW (ref 98–111)
Creatinine, Ser: 9.24 mg/dL — ABNORMAL HIGH (ref 0.61–1.24)
GFR, Estimated: 6 mL/min — ABNORMAL LOW (ref >60)
Glucose, Bld: 128 mg/dL — ABNORMAL HIGH (ref 70–99)
Potassium: 3.8 mmol/L (ref 3.5–5.1)
Sodium: 132 mmol/L — ABNORMAL LOW (ref 135–145)

## 2023-03-09 LAB — CBC WITH DIFFERENTIAL/PLATELET
Abs Immature Granulocytes: 0.06 10*3/uL (ref 0.00–0.07)
Basophils Absolute: 0 10*3/uL (ref 0.0–0.1)
Basophils Relative: 1 %
Eosinophils Absolute: 0.2 10*3/uL (ref 0.0–0.5)
Eosinophils Relative: 5 %
HCT: 26.4 % — ABNORMAL LOW (ref 39.0–52.0)
Hemoglobin: 8.1 g/dL — ABNORMAL LOW (ref 13.0–17.0)
Immature Granulocytes: 1 %
Lymphocytes Relative: 10 %
Lymphs Abs: 0.4 10*3/uL — ABNORMAL LOW (ref 0.7–4.0)
MCH: 28.6 pg (ref 26.0–34.0)
MCHC: 30.7 g/dL (ref 30.0–36.0)
MCV: 93.3 fL (ref 80.0–100.0)
Monocytes Absolute: 0.6 10*3/uL (ref 0.1–1.0)
Monocytes Relative: 13 %
Neutro Abs: 3.1 10*3/uL (ref 1.7–7.7)
Neutrophils Relative %: 70 %
Platelets: 165 10*3/uL (ref 150–400)
RBC: 2.83 MIL/uL — ABNORMAL LOW (ref 4.22–5.81)
RDW: 19.4 % — ABNORMAL HIGH (ref 11.5–15.5)
WBC: 4.4 10*3/uL (ref 4.0–10.5)
nRBC: 0 % (ref 0.0–0.2)

## 2023-03-09 LAB — MAGNESIUM: Magnesium: 1.8 mg/dL (ref 1.7–2.4)

## 2023-03-09 LAB — PHOSPHORUS: Phosphorus: 5.1 mg/dL — ABNORMAL HIGH (ref 2.5–4.6)

## 2023-03-09 NOTE — Progress Notes (Signed)
Pt uncooperative during MRI thoracic exam and refused to continue for the lumbar.

## 2023-03-10 LAB — CBC WITH DIFFERENTIAL/PLATELET
Abs Immature Granulocytes: 0.07 10*3/uL (ref 0.00–0.07)
Basophils Absolute: 0.1 10*3/uL (ref 0.0–0.1)
Basophils Relative: 1 %
Eosinophils Absolute: 0.1 10*3/uL (ref 0.0–0.5)
Eosinophils Relative: 1 %
HCT: 27.2 % — ABNORMAL LOW (ref 39.0–52.0)
Hemoglobin: 8.5 g/dL — ABNORMAL LOW (ref 13.0–17.0)
Immature Granulocytes: 1 %
Lymphocytes Relative: 6 %
Lymphs Abs: 0.5 10*3/uL — ABNORMAL LOW (ref 0.7–4.0)
MCH: 28.3 pg (ref 26.0–34.0)
MCHC: 31.3 g/dL (ref 30.0–36.0)
MCV: 90.7 fL (ref 80.0–100.0)
Monocytes Absolute: 0.8 10*3/uL (ref 0.1–1.0)
Monocytes Relative: 10 %
Neutro Abs: 6.7 10*3/uL (ref 1.7–7.7)
Neutrophils Relative %: 81 %
Platelets: 189 10*3/uL (ref 150–400)
RBC: 3 MIL/uL — ABNORMAL LOW (ref 4.22–5.81)
RDW: 19.3 % — ABNORMAL HIGH (ref 11.5–15.5)
WBC: 8.2 10*3/uL (ref 4.0–10.5)
nRBC: 0.2 % (ref 0.0–0.2)

## 2023-03-10 LAB — RENAL FUNCTION PANEL
Albumin: 1.7 g/dL — ABNORMAL LOW (ref 3.5–5.0)
Anion gap: 11 (ref 5–15)
BUN: 51 mg/dL — ABNORMAL HIGH (ref 8–23)
CO2: 24 mmol/L (ref 22–32)
Calcium: 9.1 mg/dL (ref 8.9–10.3)
Chloride: 99 mmol/L (ref 98–111)
Creatinine, Ser: 11.16 mg/dL — ABNORMAL HIGH (ref 0.61–1.24)
GFR, Estimated: 4 mL/min — ABNORMAL LOW (ref >60)
Glucose, Bld: 152 mg/dL — ABNORMAL HIGH (ref 70–99)
Phosphorus: 6.6 mg/dL — ABNORMAL HIGH (ref 2.5–4.6)
Potassium: 4.7 mmol/L (ref 3.5–5.1)
Sodium: 134 mmol/L — ABNORMAL LOW (ref 135–145)

## 2023-03-10 LAB — MAGNESIUM: Magnesium: 1.8 mg/dL (ref 1.7–2.4)

## 2023-03-11 ENCOUNTER — Other Ambulatory Visit (HOSPITAL_COMMUNITY): Payer: Medicare Other

## 2023-03-11 NOTE — Progress Notes (Signed)
Patient was brought down for second try for his MRI. Patient was extremely agitated and yelled repeatedly during imaging; patient was brought out and tried to be calmed down by spouse but there was not change in his temperament. RN and wife were told if imaging was still wanted medications would be needed. RN noted medications were given and patient was still agitated. RN to notified MD about patient inability to withstand the MRI imaging.

## 2023-03-12 ENCOUNTER — Inpatient Hospital Stay (HOSPITAL_COMMUNITY)
Admission: RE | Admit: 2023-03-12 | Discharge: 2023-05-07 | DRG: 945 | Disposition: A | Discharge status: Home Health | Payer: Medicare Other | Source: Other Acute Inpatient Hospital | Attending: Physical Medicine and Rehabilitation | Admitting: Physical Medicine and Rehabilitation

## 2023-03-12 ENCOUNTER — Encounter (HOSPITAL_COMMUNITY): Payer: Medicare Other

## 2023-03-12 ENCOUNTER — Other Ambulatory Visit: Payer: Self-pay

## 2023-03-12 ENCOUNTER — Encounter (HOSPITAL_COMMUNITY): Payer: Self-pay | Admitting: Physical Medicine and Rehabilitation

## 2023-03-12 ENCOUNTER — Other Ambulatory Visit: Payer: Self-pay | Admitting: Physician Assistant

## 2023-03-12 DIAGNOSIS — Z23 Encounter for immunization: Secondary | ICD-10-CM | POA: Diagnosis not present

## 2023-03-12 DIAGNOSIS — E871 Hypo-osmolality and hyponatremia: Secondary | ICD-10-CM | POA: Diagnosis not present

## 2023-03-12 DIAGNOSIS — I959 Hypotension, unspecified: Secondary | ICD-10-CM | POA: Diagnosis not present

## 2023-03-12 DIAGNOSIS — R319 Hematuria, unspecified: Secondary | ICD-10-CM

## 2023-03-12 DIAGNOSIS — Z1159 Encounter for screening for other viral diseases: Secondary | ICD-10-CM | POA: Diagnosis not present

## 2023-03-12 DIAGNOSIS — F03918 Unspecified dementia, unspecified severity, with other behavioral disturbance: Secondary | ICD-10-CM | POA: Diagnosis present

## 2023-03-12 DIAGNOSIS — K219 Gastro-esophageal reflux disease without esophagitis: Secondary | ICD-10-CM | POA: Diagnosis present

## 2023-03-12 DIAGNOSIS — G9340 Encephalopathy, unspecified: Secondary | ICD-10-CM

## 2023-03-12 DIAGNOSIS — Z87891 Personal history of nicotine dependence: Secondary | ICD-10-CM

## 2023-03-12 DIAGNOSIS — E1140 Type 2 diabetes mellitus with diabetic neuropathy, unspecified: Secondary | ICD-10-CM | POA: Diagnosis present

## 2023-03-12 DIAGNOSIS — Z833 Family history of diabetes mellitus: Secondary | ICD-10-CM

## 2023-03-12 DIAGNOSIS — Z6841 Body Mass Index (BMI) 40.0 and over, adult: Secondary | ICD-10-CM

## 2023-03-12 DIAGNOSIS — Z951 Presence of aortocoronary bypass graft: Secondary | ICD-10-CM | POA: Diagnosis not present

## 2023-03-12 DIAGNOSIS — Z8616 Personal history of COVID-19: Secondary | ICD-10-CM

## 2023-03-12 DIAGNOSIS — I10 Essential (primary) hypertension: Secondary | ICD-10-CM | POA: Diagnosis not present

## 2023-03-12 DIAGNOSIS — E1122 Type 2 diabetes mellitus with diabetic chronic kidney disease: Secondary | ICD-10-CM | POA: Diagnosis not present

## 2023-03-12 DIAGNOSIS — I953 Hypotension of hemodialysis: Secondary | ICD-10-CM | POA: Diagnosis present

## 2023-03-12 DIAGNOSIS — I132 Hypertensive heart and chronic kidney disease with heart failure and with stage 5 chronic kidney disease, or end stage renal disease: Secondary | ICD-10-CM | POA: Diagnosis present

## 2023-03-12 DIAGNOSIS — Z888 Allergy status to other drugs, medicaments and biological substances status: Secondary | ICD-10-CM

## 2023-03-12 DIAGNOSIS — Z8249 Family history of ischemic heart disease and other diseases of the circulatory system: Secondary | ICD-10-CM

## 2023-03-12 DIAGNOSIS — Z66 Do not resuscitate: Secondary | ICD-10-CM | POA: Diagnosis present

## 2023-03-12 DIAGNOSIS — F05 Delirium due to known physiological condition: Secondary | ICD-10-CM | POA: Diagnosis not present

## 2023-03-12 DIAGNOSIS — R296 Repeated falls: Secondary | ICD-10-CM | POA: Diagnosis present

## 2023-03-12 DIAGNOSIS — N2581 Secondary hyperparathyroidism of renal origin: Secondary | ICD-10-CM | POA: Diagnosis present

## 2023-03-12 DIAGNOSIS — I5020 Unspecified systolic (congestive) heart failure: Secondary | ICD-10-CM

## 2023-03-12 DIAGNOSIS — N186 End stage renal disease: Secondary | ICD-10-CM | POA: Diagnosis not present

## 2023-03-12 DIAGNOSIS — Z794 Long term (current) use of insulin: Secondary | ICD-10-CM | POA: Diagnosis not present

## 2023-03-12 DIAGNOSIS — Z961 Presence of intraocular lens: Secondary | ICD-10-CM | POA: Diagnosis present

## 2023-03-12 DIAGNOSIS — G4709 Other insomnia: Secondary | ICD-10-CM | POA: Diagnosis present

## 2023-03-12 DIAGNOSIS — R918 Other nonspecific abnormal finding of lung field: Secondary | ICD-10-CM | POA: Diagnosis not present

## 2023-03-12 DIAGNOSIS — Y92239 Unspecified place in hospital as the place of occurrence of the external cause: Secondary | ICD-10-CM | POA: Diagnosis present

## 2023-03-12 DIAGNOSIS — Z7951 Long term (current) use of inhaled steroids: Secondary | ICD-10-CM

## 2023-03-12 DIAGNOSIS — E785 Hyperlipidemia, unspecified: Secondary | ICD-10-CM | POA: Diagnosis present

## 2023-03-12 DIAGNOSIS — I2510 Atherosclerotic heart disease of native coronary artery without angina pectoris: Secondary | ICD-10-CM | POA: Diagnosis not present

## 2023-03-12 DIAGNOSIS — R5381 Other malaise: Secondary | ICD-10-CM | POA: Diagnosis not present

## 2023-03-12 DIAGNOSIS — R0989 Other specified symptoms and signs involving the circulatory and respiratory systems: Secondary | ICD-10-CM | POA: Diagnosis not present

## 2023-03-12 DIAGNOSIS — Z992 Dependence on renal dialysis: Secondary | ICD-10-CM | POA: Diagnosis not present

## 2023-03-12 DIAGNOSIS — Z8261 Family history of arthritis: Secondary | ICD-10-CM

## 2023-03-12 DIAGNOSIS — J449 Chronic obstructive pulmonary disease, unspecified: Secondary | ICD-10-CM | POA: Diagnosis present

## 2023-03-12 DIAGNOSIS — D631 Anemia in chronic kidney disease: Secondary | ICD-10-CM | POA: Diagnosis present

## 2023-03-12 DIAGNOSIS — F419 Anxiety disorder, unspecified: Secondary | ICD-10-CM | POA: Diagnosis present

## 2023-03-12 DIAGNOSIS — E11649 Type 2 diabetes mellitus with hypoglycemia without coma: Secondary | ICD-10-CM | POA: Diagnosis not present

## 2023-03-12 DIAGNOSIS — G510 Bell's palsy: Secondary | ICD-10-CM | POA: Diagnosis present

## 2023-03-12 DIAGNOSIS — Z7982 Long term (current) use of aspirin: Secondary | ICD-10-CM

## 2023-03-12 DIAGNOSIS — E8809 Other disorders of plasma-protein metabolism, not elsewhere classified: Secondary | ICD-10-CM | POA: Diagnosis present

## 2023-03-12 DIAGNOSIS — I5022 Chronic systolic (congestive) heart failure: Secondary | ICD-10-CM | POA: Diagnosis present

## 2023-03-12 DIAGNOSIS — W06XXXA Fall from bed, initial encounter: Secondary | ICD-10-CM | POA: Diagnosis present

## 2023-03-12 DIAGNOSIS — R001 Bradycardia, unspecified: Secondary | ICD-10-CM | POA: Diagnosis not present

## 2023-03-12 DIAGNOSIS — Z9841 Cataract extraction status, right eye: Secondary | ICD-10-CM

## 2023-03-12 DIAGNOSIS — Z79899 Other long term (current) drug therapy: Secondary | ICD-10-CM

## 2023-03-12 DIAGNOSIS — R4587 Impulsiveness: Secondary | ICD-10-CM | POA: Diagnosis present

## 2023-03-12 DIAGNOSIS — R569 Unspecified convulsions: Secondary | ICD-10-CM | POA: Diagnosis not present

## 2023-03-12 DIAGNOSIS — F32A Depression, unspecified: Secondary | ICD-10-CM | POA: Diagnosis present

## 2023-03-12 DIAGNOSIS — R410 Disorientation, unspecified: Secondary | ICD-10-CM | POA: Diagnosis not present

## 2023-03-12 DIAGNOSIS — E119 Type 2 diabetes mellitus without complications: Secondary | ICD-10-CM | POA: Diagnosis not present

## 2023-03-12 DIAGNOSIS — R040 Epistaxis: Secondary | ICD-10-CM

## 2023-03-12 DIAGNOSIS — R4182 Altered mental status, unspecified: Secondary | ICD-10-CM | POA: Diagnosis not present

## 2023-03-12 DIAGNOSIS — Z7984 Long term (current) use of oral hypoglycemic drugs: Secondary | ICD-10-CM

## 2023-03-12 DIAGNOSIS — Z6839 Body mass index (BMI) 39.0-39.9, adult: Secondary | ICD-10-CM

## 2023-03-12 DIAGNOSIS — Z515 Encounter for palliative care: Secondary | ICD-10-CM

## 2023-03-12 DIAGNOSIS — G9341 Metabolic encephalopathy: Secondary | ICD-10-CM | POA: Diagnosis not present

## 2023-03-12 DIAGNOSIS — Z781 Physical restraint status: Secondary | ICD-10-CM

## 2023-03-12 DIAGNOSIS — G479 Sleep disorder, unspecified: Secondary | ICD-10-CM | POA: Diagnosis not present

## 2023-03-12 DIAGNOSIS — R519 Headache, unspecified: Secondary | ICD-10-CM | POA: Diagnosis not present

## 2023-03-12 DIAGNOSIS — Z96651 Presence of right artificial knee joint: Secondary | ICD-10-CM | POA: Diagnosis present

## 2023-03-12 DIAGNOSIS — Z955 Presence of coronary angioplasty implant and graft: Secondary | ICD-10-CM

## 2023-03-12 DIAGNOSIS — M109 Gout, unspecified: Secondary | ICD-10-CM | POA: Diagnosis present

## 2023-03-12 DIAGNOSIS — Z885 Allergy status to narcotic agent status: Secondary | ICD-10-CM

## 2023-03-12 DIAGNOSIS — I480 Paroxysmal atrial fibrillation: Secondary | ICD-10-CM | POA: Diagnosis not present

## 2023-03-12 DIAGNOSIS — I120 Hypertensive chronic kidney disease with stage 5 chronic kidney disease or end stage renal disease: Secondary | ICD-10-CM | POA: Diagnosis not present

## 2023-03-12 DIAGNOSIS — N250 Renal osteodystrophy: Secondary | ICD-10-CM | POA: Diagnosis not present

## 2023-03-12 DIAGNOSIS — E54 Ascorbic acid deficiency: Secondary | ICD-10-CM | POA: Diagnosis present

## 2023-03-12 DIAGNOSIS — Z9842 Cataract extraction status, left eye: Secondary | ICD-10-CM

## 2023-03-12 DIAGNOSIS — I472 Ventricular tachycardia, unspecified: Secondary | ICD-10-CM | POA: Diagnosis present

## 2023-03-12 DIAGNOSIS — Z825 Family history of asthma and other chronic lower respiratory diseases: Secondary | ICD-10-CM

## 2023-03-12 DIAGNOSIS — R451 Restlessness and agitation: Secondary | ICD-10-CM

## 2023-03-12 DIAGNOSIS — G4733 Obstructive sleep apnea (adult) (pediatric): Secondary | ICD-10-CM | POA: Diagnosis present

## 2023-03-12 DIAGNOSIS — Z7189 Other specified counseling: Secondary | ICD-10-CM | POA: Diagnosis not present

## 2023-03-12 DIAGNOSIS — Z91158 Patient's noncompliance with renal dialysis for other reason: Secondary | ICD-10-CM

## 2023-03-12 DIAGNOSIS — I441 Atrioventricular block, second degree: Secondary | ICD-10-CM | POA: Diagnosis not present

## 2023-03-12 DIAGNOSIS — R059 Cough, unspecified: Secondary | ICD-10-CM | POA: Diagnosis not present

## 2023-03-12 DIAGNOSIS — Z7902 Long term (current) use of antithrombotics/antiplatelets: Secondary | ICD-10-CM

## 2023-03-12 DIAGNOSIS — A419 Sepsis, unspecified organism: Secondary | ICD-10-CM | POA: Diagnosis not present

## 2023-03-12 LAB — GLUCOSE, CAPILLARY
Glucose-Capillary: 137 mg/dL — ABNORMAL HIGH (ref 70–99)
Glucose-Capillary: 138 mg/dL — ABNORMAL HIGH (ref 70–99)

## 2023-03-12 MED ORDER — MIDODRINE HCL 5 MG PO TABS
10.0000 mg | ORAL_TABLET | ORAL | Status: DC | PRN
  Filled 2023-03-12: qty 2

## 2023-03-12 MED ORDER — FLEET ENEMA RE ENEM: 1.0000 | ENEMA | Freq: Once | RECTAL | Status: DC | PRN

## 2023-03-12 MED ORDER — CARVEDILOL 6.25 MG PO TABS
6.2500 mg | ORAL_TABLET | Freq: Two times a day (BID) | ORAL | Status: DC
  Administered 2023-03-12 – 2023-03-18 (×10): 6.25 mg via ORAL
  Filled 2023-03-12 (×10): qty 1

## 2023-03-12 MED ORDER — CLOPIDOGREL BISULFATE 75 MG PO TABS
75.0000 mg | ORAL_TABLET | Freq: Every day | ORAL | Status: DC
  Administered 2023-03-13 – 2023-03-28 (×16): 75 mg via ORAL
  Filled 2023-03-12 (×16): qty 1

## 2023-03-12 MED ORDER — BISACODYL 5 MG PO TBEC
5.0000 mg | DELAYED_RELEASE_TABLET | Freq: Every day | ORAL | Status: DC | PRN
  Filled 2023-03-12 (×2): qty 1

## 2023-03-12 MED ORDER — MAGNESIUM OXIDE -MG SUPPLEMENT 400 (240 MG) MG PO TABS
400.0000 mg | ORAL_TABLET | Freq: Every day | ORAL | Status: DC
  Administered 2023-03-13 – 2023-04-11 (×28): 400 mg via ORAL
  Filled 2023-03-12 (×29): qty 1

## 2023-03-12 MED ORDER — HEPARIN SODIUM (PORCINE) 5000 UNIT/ML IJ SOLN
5000.0000 [IU] | Freq: Three times a day (TID) | INTRAMUSCULAR | Status: DC
  Administered 2023-03-12 – 2023-03-19 (×10): 5000 [IU] via SUBCUTANEOUS
  Filled 2023-03-12 (×23): qty 1

## 2023-03-12 MED ORDER — INSULIN ASPART 100 UNIT/ML IJ SOLN: 0.0000 [IU] | Freq: Three times a day (TID) | INTRAMUSCULAR | Status: DC

## 2023-03-12 MED ORDER — GUAIFENESIN-DM 100-10 MG/5ML PO SYRP
10.0000 mL | ORAL_SOLUTION | Freq: Four times a day (QID) | ORAL | Status: DC | PRN
  Administered 2023-04-03: 10 mL via ORAL
  Filled 2023-03-12: qty 10

## 2023-03-12 MED ORDER — LIDOCAINE 5 % EX PTCH
1.0000 | MEDICATED_PATCH | Freq: Every day | CUTANEOUS | Status: DC
  Administered 2023-03-19 – 2023-04-12 (×3): 1 via TRANSDERMAL
  Filled 2023-03-12 (×16): qty 1

## 2023-03-12 MED ORDER — ASPIRIN 81 MG PO TBEC
81.0000 mg | DELAYED_RELEASE_TABLET | Freq: Every day | ORAL | Status: DC
  Administered 2023-03-13 – 2023-05-07 (×53): 81 mg via ORAL
  Filled 2023-03-12 (×55): qty 1

## 2023-03-12 MED ORDER — MOMETASONE FURO-FORMOTEROL FUM 200-5 MCG/ACT IN AERO
2.0000 | INHALATION_SPRAY | Freq: Two times a day (BID) | RESPIRATORY_TRACT | Status: DC
  Administered 2023-03-12 – 2023-04-06 (×35): 2 via RESPIRATORY_TRACT
  Administered 2023-04-06: 1 via RESPIRATORY_TRACT
  Administered 2023-04-07 – 2023-05-07 (×44): 2 via RESPIRATORY_TRACT
  Filled 2023-03-12 (×3): qty 8.8

## 2023-03-12 MED ORDER — GERHARDT'S BUTT CREAM
1.0000 | TOPICAL_CREAM | Freq: Four times a day (QID) | CUTANEOUS | Status: DC
  Administered 2023-03-13 – 2023-05-04 (×59): 1 via TOPICAL
  Filled 2023-03-12 (×3): qty 1

## 2023-03-12 MED ORDER — ACETAMINOPHEN 325 MG PO TABS
325.0000 mg | ORAL_TABLET | ORAL | Status: DC | PRN
  Administered 2023-03-13 – 2023-04-12 (×6): 650 mg via ORAL
  Filled 2023-03-12 (×9): qty 2

## 2023-03-12 MED ORDER — ATORVASTATIN CALCIUM 80 MG PO TABS
80.0000 mg | ORAL_TABLET | Freq: Every day | ORAL | Status: DC
  Administered 2023-03-13 – 2023-05-07 (×52): 80 mg via ORAL
  Filled 2023-03-12 (×53): qty 1

## 2023-03-12 MED ORDER — PANTOPRAZOLE SODIUM 40 MG PO TBEC
40.0000 mg | DELAYED_RELEASE_TABLET | Freq: Two times a day (BID) | ORAL | Status: DC
  Administered 2023-03-13 – 2023-05-07 (×106): 40 mg via ORAL
  Filled 2023-03-12 (×110): qty 1

## 2023-03-12 MED ORDER — ONDANSETRON HCL 4 MG PO TABS
4.0000 mg | ORAL_TABLET | Freq: Four times a day (QID) | ORAL | Status: DC | PRN
  Administered 2023-03-16: 4 mg via ORAL
  Filled 2023-03-12: qty 1

## 2023-03-12 MED ORDER — ONDANSETRON HCL 4 MG/2ML IJ SOLN
4.0000 mg | Freq: Four times a day (QID) | INTRAMUSCULAR | Status: DC | PRN
  Administered 2023-04-03: 4 mg via INTRAVENOUS
  Filled 2023-03-12: qty 2

## 2023-03-12 MED ORDER — EPOETIN ALFA-EPBX 40000 UNIT/ML IJ SOLN: 30000.0000 [IU] | INTRAMUSCULAR | Status: DC

## 2023-03-12 MED ORDER — ENSURE ENLIVE PO LIQD
237.0000 mL | Freq: Two times a day (BID) | ORAL | Status: DC
  Administered 2023-03-13 – 2023-05-04 (×31): 237 mL via ORAL

## 2023-03-12 MED ORDER — ZIPRASIDONE MESYLATE 20 MG IM SOLR
20.0000 mg | Freq: Two times a day (BID) | INTRAMUSCULAR | Status: DC | PRN
  Administered 2023-03-13: 20 mg via INTRAMUSCULAR
  Filled 2023-03-12 (×3): qty 20

## 2023-03-12 MED ORDER — QUETIAPINE FUMARATE 50 MG PO TABS
50.0000 mg | ORAL_TABLET | Freq: Three times a day (TID) | ORAL | Status: DC
  Administered 2023-03-12 – 2023-03-13 (×3): 50 mg via ORAL
  Filled 2023-03-12 (×3): qty 1

## 2023-03-12 MED ORDER — CLONAZEPAM 0.5 MG PO TABS
0.5000 mg | ORAL_TABLET | Freq: Three times a day (TID) | ORAL | Status: DC | PRN
  Administered 2023-03-12 – 2023-03-13 (×2): 0.5 mg via ORAL
  Filled 2023-03-12 (×2): qty 1

## 2023-03-12 MED ORDER — RISAQUAD PO CAPS
1.0000 | ORAL_CAPSULE | Freq: Every day | ORAL | Status: DC
  Administered 2023-03-13 – 2023-03-23 (×11): 1 via ORAL
  Filled 2023-03-12 (×11): qty 1

## 2023-03-12 MED ORDER — RENA-VITE PO TABS
1.0000 | ORAL_TABLET | Freq: Every day | ORAL | Status: DC
  Administered 2023-03-12 – 2023-03-22 (×11): 1 via ORAL
  Filled 2023-03-12 (×11): qty 1

## 2023-03-12 NOTE — H&P (Signed)
Physical Medicine and Rehabilitation Admission H&P   CC: Functional deficits secondary to   HPI: [name] [name] was admitted to acute care [facility] on 02/15/2023 after presenting to [facility] ED with acute metabolic encephalopathy assumed due to recent Covid infection PTA treated with Augmentin, multifocal pneumonia. History of ESRD on peritoneal dialysis and nephrology consulted. PD cath malfunction and temporary HD catheter placed. He was found to have sepsis secondary to peritoneal dialysis associated catheter peritonitis and polymicrobial bacteremia/fungemia. Broad spectrum antibiotics started. Hospital course complicated by worsening encephalopathy with hypoxemia requiring transfer to the ICU. He was found to have sepsis secondary to peritoneal dialysis associated catheter peritonitis and polymicrobial bacteremia/fungemia. Hospital course complicated by worsening encephalopathy with hypoxemia requiring transfer to the ICU. Blood cultures were positive for Candida glabrata and staph species and on 8/5>> PD catheter removed in OR. He also has a fistula that has been present in the left upper arm for many years but was unable to be used and is pulsatile on exam. Underwent fistulogram with balloon venoplasty on 8/14 by Dr. Karin Lieu. Fistula was used for HD treatment on 8/16. Discharged to select [facility] on 8/16. He required Geodon prn for agitation. Noted to have 16 beat run of wide complex tachycardia 8/18. IV Unasyn completed on 8/21 for multifocal pneumonia. He has remained delirious with anxiety and continued clonazepam. Cardiology consultation obtained on 8/24, Dr. Algie Coffer. Hypomagnesemia corrected. Recommended to continue carvedilol 6.25 mg BID; consider increasing to 12.5 mg BID before starting low dose amiodarone. Consider Eliquis and discontinue asa and Plavix in one week if no active GI bleeding and stable hemoglobin. He developed back pain and MRI ordered; however, he was not cooperative for  exam 8/25. Somnolent on exam. Noted orders in EPIC for UA, MRI brain and lumbar spine and not aware of whether these will be done prior to CIR admission. Epistaxis resolved with Flonase and saline nose spray.  SLP earlier in admission with SLUMs 7/30. Tolerating carb modified diet. HD scheduled on T/TS. The patient requires inpatient medicine and rehabilitation evaluations and services for ongoing dysfunction secondary to encephalopathy.  Wife, [name] [name], is POA. Daughter, [name] [name]. Former smoker. Denies illicit drug or alcohol use per chart.    Review of Systems  Reason unable to perform ROS: Limited by Cognition.   Past Medical History:  Diagnosis Date   Anemia    Arthritis    Asthma    Bell palsy    CAD (coronary artery disease)    a. 2014 MV: abnl w/ infap ischemia; b. 03/2013 Cath: aneurysmal bleb in the LAD w/ otw nonobs dzs-->Med Rx.   Chronic back pain    Chronic knee pain    a. 09/2015 s/p R TKA.   Chronic pain    Chronic shoulder pain    Chronic sinusitis    COPD (chronic obstructive pulmonary disease) (HCC)    Diabetes mellitus without complication (HCC)    type II    ESRD on peritoneal dialysis (HCC)    on peritoneal dialysis, [facility]   Essential hypertension    GERD (gastroesophageal reflux disease)    Gout    Gout    Hepatomegaly    noted on noncontrast CT 2015   History of hiatal hernia    Hyperlipidemia    Lateral meniscus tear    Obesity    Truncal   Obstructive sleep apnea    does not use cpap    On home oxygen therapy    uses 2l  when is going somewhere per patient    PUD (peptic ulcer disease)    remote, reports f/u EGD about 8 years ago unremarkable    Reactive airway disease    related to exposure to chemical during 9/11   Sinusitis    Vitamin D deficiency    Past Surgical History:  Procedure Laterality Date   A/V FISTULAGRAM N/A 02/28/2023   Procedure: A/V Fistulagram;  Surgeon: Victorino Sparrow, MD;  Location: [facility] INVASIVE CV  LAB;  Service: Cardiovascular;  Laterality: N/A;   ASAD LT SHOULDER  12/15/2008   left shoulder   AV FISTULA PLACEMENT Left 08/09/2016   Procedure: BRACHIOCEPHALIC ARTERIOVENOUS (AV) FISTULA CREATION LEFT ARM;  Surgeon: Sherren Kerns, MD;  Location: [facility] OR;  Service: Vascular;  Laterality: Left;   CAPD INSERTION N/A 10/07/2018   Procedure: LAPAROSCOPIC PERITONEAL CATHETER PLACEMENT;  Surgeon: Rodman Pickle, MD;  Location: [facility] ORS;  Service: General;  Laterality: N/A;   CATARACT EXTRACTION W/PHACO Left 03/28/2016   Procedure: CATARACT EXTRACTION PHACO AND INTRAOCULAR LENS PLACEMENT LEFT EYE;  Surgeon: Jethro Bolus, MD;  Location: [facility] ORS;  Service: Ophthalmology;  Laterality: Left;  CDE: 4.77   CATARACT EXTRACTION W/PHACO Right 04/11/2016   Procedure: CATARACT EXTRACTION PHACO AND INTRAOCULAR LENS PLACEMENT RIGHT EYE; CDE:  4.74;  Surgeon: Jethro Bolus, MD;  Location: [facility] ORS;  Service: Ophthalmology;  Laterality: Right;   COLONOSCOPY  10/15/2008   Fields: Rectal polyp obliterated, not retrieved, hemorrhoids, single ascending colon diverticulum near the CV. Next colonoscopy April 2020   COLONOSCOPY N/A 12/25/2014   SLF: 1. Colorectal polyps (2) removed 2. Small internal hemorrhoids 3. the left colon is severely redundant. hyperplastic polyps   CORONARY ARTERY BYPASS GRAFT N/A 06/05/2022   Procedure: OFF PUMP CORONARY ARTERY BYPASS GRAFTING (CABG) X 2 BYPASSES USING LEFT INTERNAL MAMMARY ARTERY AND RIGHT LEG GREATER SAPHENOUS VEIN HARVESTED ENDOSCOPICALLY;  Surgeon: Corliss Skains, MD;  Location: [facility] OR;  Service: Open Heart Surgery;  Laterality: N/A;   CORONARY STENT INTERVENTION N/A 07/25/2021   Procedure: CORONARY STENT INTERVENTION;  Surgeon: Tonny Bollman, MD;  Location: [facility] INVASIVE CV LAB;  Service: Cardiovascular;  Laterality: N/A;   CORONARY STENT INTERVENTION N/A 12/26/2021   Procedure: CORONARY STENT INTERVENTION;  Surgeon: Yvonne Kendall, MD;  Location: [facility] INVASIVE CV LAB;   Service: Cardiovascular;  Laterality: N/A;   CORONARY STENT INTERVENTION N/A 01/20/2022   Procedure: CORONARY STENT INTERVENTION;  Surgeon: Tonny Bollman, MD;  Location: [facility] INVASIVE CV LAB;  Service: Cardiovascular;  Laterality: N/A;   DOPPLER ECHOCARDIOGRAPHY     ESOPHAGOGASTRODUODENOSCOPY N/A 12/25/2014   SLF: 1. Anemia most likely due to CRI, gastritis, gastric polyps 2. Moderate non-erosive gastriits and mild duodenitis.  3.TWo large gstric polyps removed.    EYE SURGERY  12/22/2010   tear duct probing-[place]   FOREIGN BODY REMOVAL  03/29/2011   Procedure: REMOVAL FOREIGN BODY EXTREMITY;  Surgeon: Fuller Canada, MD;  Location: [facility] ORS;  Service: Orthopedics;  Laterality: Right;  Removal Foreign Body Right Thumb   IR FLUORO GUIDE CV LINE RIGHT  08/06/2018   IR FLUORO GUIDE CV LINE RIGHT  02/17/2023   IR US GUIDE VASC ACCESS RIGHT  08/06/2018   IR US GUIDE VASC ACCESS RIGHT  02/17/2023   KNEE ARTHROSCOPY  10/16/2007   left   KNEE ARTHROSCOPY WITH LATERAL MENISECTOMY Right 10/14/2015   Procedure: LEFT KNEE ARTHROSCOPY WITH PARTIAL LATERAL MENISECTOMY;  Surgeon: Vickki Hearing, MD;  Location: [facility] ORS;  Service: Orthopedics;  Laterality: Right;  LEFT HEART CATH AND CORONARY ANGIOGRAPHY N/A 07/25/2021   Procedure: LEFT HEART CATH AND CORONARY ANGIOGRAPHY;  Surgeon: Tonny Bollman, MD;  Location: [facility] INVASIVE CV LAB;  Service: Cardiovascular;  Laterality: N/A;   LEFT HEART CATH AND CORONARY ANGIOGRAPHY N/A 12/26/2021   Procedure: LEFT HEART CATH AND CORONARY ANGIOGRAPHY;  Surgeon: Yvonne Kendall, MD;  Location: [facility] INVASIVE CV LAB;  Service: Cardiovascular;  Laterality: N/A;   LEFT HEART CATH AND CORONARY ANGIOGRAPHY N/A 01/20/2022   Procedure: LEFT HEART CATH AND CORONARY ANGIOGRAPHY;  Surgeon: Tonny Bollman, MD;  Location: [facility] INVASIVE CV LAB;  Service: Cardiovascular;  Laterality: N/A;   LEFT HEART CATHETERIZATION WITH CORONARY ANGIOGRAM N/A 03/28/2013   Procedure: LEFT HEART  CATHETERIZATION WITH CORONARY ANGIOGRAM;  Surgeon: Marykay Lex, MD;  Location: [facility] CATH LAB;  Service: Cardiovascular;  Laterality: N/A;   NM MYOVIEW LTD     PENILE PROSTHESIS IMPLANT N/A 08/16/2015   Procedure: PENILE PROTHESIS INFLATABLE, three piece, Excisional biopsy of Penile ulcer, Penile molding;  Surgeon: Jethro Bolus, MD;  Location: [facility] ORS;  Service: Urology;  Laterality: N/A;   PENILE PROSTHESIS IMPLANT N/A 12/24/2017   Procedure: REMOVAL AND  REPLACEMENT  COLOPLAST PENILE PROSTHESIS;  Surgeon: Crista Elliot, MD;  Location: [facility] ORS;  Service: Urology;  Laterality: N/A;   PERIPHERAL VASCULAR BALLOON ANGIOPLASTY  02/28/2023   Procedure: PERIPHERAL VASCULAR BALLOON ANGIOPLASTY;  Surgeon: Victorino Sparrow, MD;  Location: [facility] INVASIVE CV LAB;  Service: Cardiovascular;;   PORT-A-CATH REMOVAL N/A 02/19/2023   Procedure: REMOVAL OF PERITONEAL DIALYSIS CATHETER;  Surgeon: Victorino Sparrow, MD;  Location: [facility] OR;  Service: Vascular;  Laterality: N/A;   QUADRICEPS TENDON REPAIR  07/21/2011   Procedure: REPAIR QUADRICEP TENDON;  Surgeon: Fuller Canada, MD;  Location: [facility] ORS;  Service: Orthopedics;  Laterality: Right;   RIGHT/LEFT HEART CATH AND CORONARY ANGIOGRAPHY N/A 05/30/2022   Procedure: RIGHT/LEFT HEART CATH AND CORONARY ANGIOGRAPHY;  Surgeon: Corky Crafts, MD;  Location: [facility] INVASIVE CV LAB;  Service: Cardiovascular;  Laterality: N/A;   TEE WITHOUT CARDIOVERSION N/A 06/05/2022   Procedure: TRANSESOPHAGEAL ECHOCARDIOGRAM (TEE);  Surgeon: Corliss Skains, MD;  Location: [facility] OR;  Service: Open Heart Surgery;  Laterality: N/A;   TOENAIL EXCISION     removed x2-bilateral   UMBILICAL HERNIA REPAIR  07/17/2005   [place]   Family History  Problem Relation Age of Onset   Hypertension Mother        MI   Cancer Mother        breast    Diabetes Mother    Diabetes Father    Hypertension Father    Hypertension Sister    Diabetes Sister    Arthritis Other    Asthma Other     Lung disease Other    Anesthesia problems Neg Hx    Hypotension Neg Hx    Malignant hyperthermia Neg Hx    Pseudochol deficiency Neg Hx    Colon cancer Neg Hx    Social History:  reports that he quit smoking about 12 years ago. His smoking use included cigarettes. He started smoking about 37 years ago. He has a 25 pack-year smoking history. He has never used smokeless tobacco. He reports that he does not currently use alcohol. He reports current drug use. Drug: Marijuana. Allergies:  Allergies  Allergen Reactions   Opana [Oxymorphone Hcl] Itching   Tramadol Itching   Medications Prior to Admission  Medication Sig Dispense Refill   albuterol (VENTOLIN HFA) 108 (90 Base) MCG/ACT  inhaler Inhale 2 puffs into the lungs every 6 (six) hours as needed for wheezing or shortness of breath. 18 g 11   aspirin EC 81 MG tablet Take 81 mg by mouth daily. Swallow whole.     atorvastatin (LIPITOR) 80 MG tablet Take 1 tablet (80 mg total) by mouth daily. 30 tablet 1   clopidogrel (PLAVIX) 75 MG tablet Take 1 tablet (75 mg total) by mouth daily. Please take first dose at 9 pm on 12/4 30 tablet 1   feeding supplement (ENSURE ENLIVE / ENSURE PLUS) LIQD Take 237 mLs by mouth 2 (two) times daily between meals.     fluticasone (FLONASE) 50 MCG/ACT nasal spray Place 1 spray into both nostrils as needed for allergies or rhinitis. (Patient not taking: Reported on 02/16/2023)     insulin aspart (NOVOLOG) 100 UNIT/ML injection 0-6 Units, Subcutaneous, 3 times daily with meals, CBG < 70: Implement Hypoglycemia measures CBG 70 - 120: 0 units CBG 121 - 150: 0 units CBG 151 - 200: 1 unit CBG 201-250: 2 units CBG 251-300: 3 units CBG 301-350: 4 units CBG 351-400: 5 units CBG > 400: Give 6 units and call MD     midodrine (PROAMATINE) 10 MG tablet Take 1 tablet (10 mg total) by mouth 3 (three) times daily with meals.     midodrine (PROAMATINE) 10 MG tablet Take 1 tablet (10 mg total) by mouth every dialysis (give 30- 60 min  prior to each HD session).     multivitamin (RENA-VIT) TABS tablet Take 1 tablet by mouth at bedtime.     nitroGLYCERIN (NITROSTAT) 0.4 MG SL tablet Place 1 tablet (0.4 mg total) under the tongue every 5 (five) minutes as needed for chest pain.     Nystatin (GERHARDT'S BUTT CREAM) CREA Apply 1 Application topically 4 (four) times daily.     pantoprazole (PROTONIX) 40 MG tablet Take 1 tablet (40 mg total) by mouth 2 (two) times daily.     SYMBICORT 160-4.5 MCG/ACT inhaler INHALE 2 PUFFS INTO THE LUNGS TWICE DAILY. 10.2 g 6      Home: Home Living Family/patient expects to be discharged to:: Private residence Living Arrangements: Spouse/significant other   Functional History:    Functional Status:  Mobility:          ADL:    Cognition:      Physical Exam: Blood pressure 131/63, temperature 98.6 F (37 C), temperature source Axillary, resp. rate 20, height 5\' 4"  (1.626 m), weight 103.1 kg, SpO2 98%. Physical Exam   General: Lying in bed, drowsy, NAD  HEENT: Head is normocephalic, atraumatic, sclera anicteric, oral mucosa pink and moist Neck: Supple without JVD or lymphadenopathy Heart: Reg rate and rhythm. Chest: CTAB, on nasal cannula O2 3 L  Abdomen: Soft, non-tender, mildly-distended, bowel sounds positive. Extremities: Nonpitting edema noted in bilateral upper and lower extremities Psych: Drowsy becomes agitated with questions Skin: Warm and dry, no breakdown noted Neuro: Drowsy, wakes to voice but does not open his eyes, very intermittent following of commands-he says he does not want to do this right now Becomes agitated when I encouraged him to try to follow commands or answer questions.  Delayed responses.  Alert and oriented to person and hospital only, No dysarthria noted, cranial nerves II through XII appear grossly intact.  He would not follow commands for extraocular testing.  He would not open his mouth fully to command but would repeat "my mouth is  open" Patient reported sensation to light touch intact  in all 4 extremities He was able to squeeze my hand bilaterally.  He would not follow MMT however he was moving all 4 extremities equally. Musculoskeletal: No joint swelling noted No abnormal tone noted He appeared to have some generalized tenderness in his distal lower extremities however limited exam due to agitation IV right upper extremity looks okay, left upper extremity fistula    Results for orders placed or performed during the hospital encounter of 03/12/23 (from the past 48 hour(s))  Glucose, capillary     Status: Abnormal   Collection Time: 03/12/23  5:22 PM  Result Value Ref Range   Glucose-Capillary 137 (H) 70 - 99 mg/dL    Comment: Glucose reference range applies only to samples taken after fasting for at least 8 hours.   No results found.    Blood pressure 131/63, temperature 98.6 F (37 C), temperature source Axillary, resp. rate 20, height 5\' 4"  (1.626 m), weight 103.1 kg, SpO2 98%.  Medical Problem List and Plan: 1. Functional deficits secondary to debility due to sepsis secondary to catheter peritonitis from PD catheter with resultant encephalopathy and hypoxemia   -patient may not shower  -ELOS/Goals: 16-18 days, PT/OT Sup, SLP min A  -Admit to CIR  2.  Antithrombotics: -DVT/anticoagulation:  Pharmaceutical: Heparin  -antiplatelet therapy: aspirin and Plavix  3. Pain Management: Tylenol as needed  -Lidoderm patches to back  4. Mood/Behavior/Sleep: LCSW to evaluate and provide emotional support  -continue Seroquel 50 mg TID  -clonazepam 0.5 mg TID prn  -Geodon 10 mg IM q 12 hours prn  -antipsychotic agents: n/a  5. Neuropsych/cognition: This patient is not capable of making decisions on his own behalf  6. Skin/Wound Care: Routine skin care checks  -sacral skin off-loading/protection   7. Fluids/Electrolytes/Nutrition: Strict Is and Os and follow-up chemistries per nephrology  -renal diet/ 1200  cc FR  8: Hypotension: monitor TID and prn  -continue midodrine as needed during dialysis  9: Hyperlipidemia: continue statin  10: CAD s/p CABG 2023: on DAPT and statin  11: DM-2: A1c = 6.3% (at home on Lantus 22 units at bedtime, Tradjenta 5 mg daily)  -monitor PO intake  -continue SSI (0-6 units) does not appear to be requiring coverage  12: ESRD: HD on Tues, Thurs, Sat  13: COPD/OSA; chronic home 2L O2 (Symbicort, Ventolin, prednisone 40 mg daily at home)  -continue symbicort  14: GERD: continue Protonix  15: Non-sustained VT/pAF: per cardiology, Dr. Algie Coffer: -continue carvedilol 6.25 mg BID; consider increasing to 12.5 mg BID before starting low dose amiodarone  -consider Eliquis and discontinue asa and Plavix in one week if no active GI bleeding and stable hemoglobin from 8/24 -Continue mag-ox to avoid hypomagnesemia  16: HFrEF: Daily weights. Monitor for signs of overload  17: Liver nodularity on CT scan: LFTs reported to be normal. Avoid hepatotoxic medications   18: Encephalopathy  -Continue Seroquel 50mg  TID, Geodon 20mg  IM q12h PRN,enclosure bed  19: Epistaxis  -previously improved with flonase and saline spray  20: Morbid Obesity. Dietary counseling Body mass index is 39.01 kg/m.     Milinda Antis, PA-C 03/12/2023   I have personally performed a face to face diagnostic evaluation of this patient and formulated the key components of the plan.  Additionally, I have personally reviewed laboratory data, imaging studies, as well as relevant notes and concur with the physician assistant's documentation above.  The patient's status has not changed from the original H&P.  Any changes in documentation from the acute  care chart have been noted above.  Fanny Dance, MD, [place]

## 2023-03-12 NOTE — H&P (Incomplete)
Physical Medicine and Rehabilitation Admission H&P   CC: Functional deficits secondary to   HPI: [name] [name] was admitted to acute care [facility] on 02/15/2023 after presenting to [facility] ED with acute metabolic encephalopathy assumed due to recent Covid infection PTA treated with Augmentin, multifocal pneumonia. History of ESRD on peritoneal dialysis and nephrology consulted. PD cath malfunction and temporary HD catheter placed. He was found to have sepsis secondary to peritoneal dialysis associated catheter peritonitis and polymicrobial bacteremia/fungemia. Broad spectrum antibiotics started. Hospital course complicated by worsening encephalopathy with hypoxemia requiring transfer to the ICU. He was found to have sepsis secondary to peritoneal dialysis associated catheter peritonitis and polymicrobial bacteremia/fungemia. Hospital course complicated by worsening encephalopathy with hypoxemia requiring transfer to the ICU. Blood cultures were positive for Candida glabrata and staph species and on 8/5>> PD catheter removed in OR. He also has a fistula that has been present in the left upper arm for many years but was unable to be used and is pulsatile on exam. Underwent fistulogram with balloon venoplasty on 8/14 by Dr. Karin Lieu. Fistula was used for HD treatment on 8/16. Discharged to select [facility] on 8/16. He required Geodon prn for agitation. Noted to have 16 beat run of wide complex tachycardia 8/18. IV Unasyn completed on 8/21. He has remained delirious with anxiety and continued clonazepam. Cardiology consultation obtained on 8/24, Dr. Algie Coffer. Hypomagnesemia corrected. Recommended to continue carvedilol 6.25 mg BID; consider increasing to 12.5 mg BID before starting low dose amiodarone. Consider Eliquis and discontinue asa and Plavix in one week if no active GI bleeding and stable hemoglobin. He developed back pain and MRI ordered; however, he was not cooperative for exam 8/25. Somnolent on  exam. Noted orders in EPIC for UA, MRI brain and lumbar spine and not aware of whether these will be done prior to CIR admission. SLP earlier in admission with SLUMs 7/30. Tolerating carb modified diet. HD scheduled on T/TS. The patient requires inpatient medicine and rehabilitation evaluations and services for ongoing dysfunction secondary to encephalopathy.  Wife, [name] [name], is POA. Daughter, [name] [name]. Former smoker. Denies illicit drug or alcohol use per chart.    ROS Past Medical History:  Diagnosis Date   Anemia    Arthritis    Asthma    Bell palsy    CAD (coronary artery disease)    a. 2014 MV: abnl w/ infap ischemia; b. 03/2013 Cath: aneurysmal bleb in the LAD w/ otw nonobs dzs-->Med Rx.   Chronic back pain    Chronic knee pain    a. 09/2015 s/p R TKA.   Chronic pain    Chronic shoulder pain    Chronic sinusitis    COPD (chronic obstructive pulmonary disease) (HCC)    Diabetes mellitus without complication (HCC)    type II    ESRD on peritoneal dialysis (HCC)    on peritoneal dialysis, [facility]   Essential hypertension    GERD (gastroesophageal reflux disease)    Gout    Gout    Hepatomegaly    noted on noncontrast CT 2015   History of hiatal hernia    Hyperlipidemia    Lateral meniscus tear    Obesity    Truncal   Obstructive sleep apnea    does not use cpap    On home oxygen therapy    uses 2l when is going somewhere per patient    PUD (peptic ulcer disease)    remote, reports f/u EGD about 8 years ago unremarkable  Reactive airway disease    related to exposure to chemical during 9/11   Sinusitis    Vitamin D deficiency    Past Surgical History:  Procedure Laterality Date   A/V FISTULAGRAM N/A 02/28/2023   Procedure: A/V Fistulagram;  Surgeon: Victorino Sparrow, MD;  Location: [facility] INVASIVE CV LAB;  Service: Cardiovascular;  Laterality: N/A;   ASAD LT SHOULDER  12/15/2008   left shoulder   AV FISTULA PLACEMENT Left 08/09/2016   Procedure:  BRACHIOCEPHALIC ARTERIOVENOUS (AV) FISTULA CREATION LEFT ARM;  Surgeon: Sherren Kerns, MD;  Location: [facility] OR;  Service: Vascular;  Laterality: Left;   CAPD INSERTION N/A 10/07/2018   Procedure: LAPAROSCOPIC PERITONEAL CATHETER PLACEMENT;  Surgeon: Rodman Pickle, MD;  Location: [facility] ORS;  Service: General;  Laterality: N/A;   CATARACT EXTRACTION W/PHACO Left 03/28/2016   Procedure: CATARACT EXTRACTION PHACO AND INTRAOCULAR LENS PLACEMENT LEFT EYE;  Surgeon: Jethro Bolus, MD;  Location: [facility] ORS;  Service: Ophthalmology;  Laterality: Left;  CDE: 4.77   CATARACT EXTRACTION W/PHACO Right 04/11/2016   Procedure: CATARACT EXTRACTION PHACO AND INTRAOCULAR LENS PLACEMENT RIGHT EYE; CDE:  4.74;  Surgeon: Jethro Bolus, MD;  Location: [facility] ORS;  Service: Ophthalmology;  Laterality: Right;   COLONOSCOPY  10/15/2008   Fields: Rectal polyp obliterated, not retrieved, hemorrhoids, single ascending colon diverticulum near the CV. Next colonoscopy April 2020   COLONOSCOPY N/A 12/25/2014   SLF: 1. Colorectal polyps (2) removed 2. Small internal hemorrhoids 3. the left colon is severely redundant. hyperplastic polyps   CORONARY ARTERY BYPASS GRAFT N/A 06/05/2022   Procedure: OFF PUMP CORONARY ARTERY BYPASS GRAFTING (CABG) X 2 BYPASSES USING LEFT INTERNAL MAMMARY ARTERY AND RIGHT LEG GREATER SAPHENOUS VEIN HARVESTED ENDOSCOPICALLY;  Surgeon: Corliss Skains, MD;  Location: [facility] OR;  Service: Open Heart Surgery;  Laterality: N/A;   CORONARY STENT INTERVENTION N/A 07/25/2021   Procedure: CORONARY STENT INTERVENTION;  Surgeon: Tonny Bollman, MD;  Location: [facility] INVASIVE CV LAB;  Service: Cardiovascular;  Laterality: N/A;   CORONARY STENT INTERVENTION N/A 12/26/2021   Procedure: CORONARY STENT INTERVENTION;  Surgeon: Yvonne Kendall, MD;  Location: [facility] INVASIVE CV LAB;  Service: Cardiovascular;  Laterality: N/A;   CORONARY STENT INTERVENTION N/A 01/20/2022   Procedure: CORONARY STENT INTERVENTION;  Surgeon: Tonny Bollman, MD;  Location: [facility] INVASIVE CV LAB;  Service: Cardiovascular;  Laterality: N/A;   DOPPLER ECHOCARDIOGRAPHY     ESOPHAGOGASTRODUODENOSCOPY N/A 12/25/2014   SLF: 1. Anemia most likely due to CRI, gastritis, gastric polyps 2. Moderate non-erosive gastriits and mild duodenitis.  3.TWo large gstric polyps removed.    EYE SURGERY  12/22/2010   tear duct probing-   FOREIGN BODY REMOVAL  03/29/2011   Procedure: REMOVAL FOREIGN BODY EXTREMITY;  Surgeon: Fuller Canada, MD;  Location: [facility] ORS;  Service: Orthopedics;  Laterality: Right;  Removal Foreign Body Right Thumb   IR FLUORO GUIDE CV LINE RIGHT  08/06/2018   IR FLUORO GUIDE CV LINE RIGHT  02/17/2023   IR US GUIDE VASC ACCESS RIGHT  08/06/2018   IR US GUIDE VASC ACCESS RIGHT  02/17/2023   KNEE ARTHROSCOPY  10/16/2007   left   KNEE ARTHROSCOPY WITH LATERAL MENISECTOMY Right 10/14/2015   Procedure: LEFT KNEE ARTHROSCOPY WITH PARTIAL LATERAL MENISECTOMY;  Surgeon: Vickki Hearing, MD;  Location: [facility] ORS;  Service: Orthopedics;  Laterality: Right;   LEFT HEART CATH AND CORONARY ANGIOGRAPHY N/A 07/25/2021   Procedure: LEFT HEART CATH AND CORONARY ANGIOGRAPHY;  Surgeon: Tonny Bollman, MD;  Location: [facility] INVASIVE CV  LAB;  Service: Cardiovascular;  Laterality: N/A;   LEFT HEART CATH AND CORONARY ANGIOGRAPHY N/A 12/26/2021   Procedure: LEFT HEART CATH AND CORONARY ANGIOGRAPHY;  Surgeon: Yvonne Kendall, MD;  Location: [facility] INVASIVE CV LAB;  Service: Cardiovascular;  Laterality: N/A;   LEFT HEART CATH AND CORONARY ANGIOGRAPHY N/A 01/20/2022   Procedure: LEFT HEART CATH AND CORONARY ANGIOGRAPHY;  Surgeon: Tonny Bollman, MD;  Location: [facility] INVASIVE CV LAB;  Service: Cardiovascular;  Laterality: N/A;   LEFT HEART CATHETERIZATION WITH CORONARY ANGIOGRAM N/A 03/28/2013   Procedure: LEFT HEART CATHETERIZATION WITH CORONARY ANGIOGRAM;  Surgeon: Marykay Lex, MD;  Location: [facility] CATH LAB;  Service: Cardiovascular;  Laterality: N/A;   NM MYOVIEW LTD      PENILE PROSTHESIS IMPLANT N/A 08/16/2015   Procedure: PENILE PROTHESIS INFLATABLE, three piece, Excisional biopsy of Penile ulcer, Penile molding;  Surgeon: Jethro Bolus, MD;  Location: [facility] ORS;  Service: Urology;  Laterality: N/A;   PENILE PROSTHESIS IMPLANT N/A 12/24/2017   Procedure: REMOVAL AND  REPLACEMENT  COLOPLAST PENILE PROSTHESIS;  Surgeon: Crista Elliot, MD;  Location: [facility] ORS;  Service: Urology;  Laterality: N/A;   PERIPHERAL VASCULAR BALLOON ANGIOPLASTY  02/28/2023   Procedure: PERIPHERAL VASCULAR BALLOON ANGIOPLASTY;  Surgeon: Victorino Sparrow, MD;  Location: [facility] INVASIVE CV LAB;  Service: Cardiovascular;;   PORT-A-CATH REMOVAL N/A 02/19/2023   Procedure: REMOVAL OF PERITONEAL DIALYSIS CATHETER;  Surgeon: Victorino Sparrow, MD;  Location: [facility] OR;  Service: Vascular;  Laterality: N/A;   QUADRICEPS TENDON REPAIR  07/21/2011   Procedure: REPAIR QUADRICEP TENDON;  Surgeon: Fuller Canada, MD;  Location: [facility] ORS;  Service: Orthopedics;  Laterality: Right;   RIGHT/LEFT HEART CATH AND CORONARY ANGIOGRAPHY N/A 05/30/2022   Procedure: RIGHT/LEFT HEART CATH AND CORONARY ANGIOGRAPHY;  Surgeon: Corky Crafts, MD;  Location: [facility] INVASIVE CV LAB;  Service: Cardiovascular;  Laterality: N/A;   TEE WITHOUT CARDIOVERSION N/A 06/05/2022   Procedure: TRANSESOPHAGEAL ECHOCARDIOGRAM (TEE);  Surgeon: Corliss Skains, MD;  Location: [facility] OR;  Service: Open Heart Surgery;  Laterality: N/A;   TOENAIL EXCISION     removed x2-bilateral   UMBILICAL HERNIA REPAIR  07/17/2005   [place]   Family History  Problem Relation Age of Onset   Hypertension Mother        MI   Cancer Mother        breast    Diabetes Mother    Diabetes Father    Hypertension Father    Hypertension Sister    Diabetes Sister    Arthritis Other    Asthma Other    Lung disease Other    Anesthesia problems Neg Hx    Hypotension Neg Hx    Malignant hyperthermia Neg Hx    Pseudochol deficiency Neg Hx    Colon cancer  Neg Hx    Social History:  reports that he quit smoking about 12 years ago. His smoking use included cigarettes. He started smoking about 37 years ago. He has a 25 pack-year smoking history. He has never used smokeless tobacco. He reports that he does not currently use alcohol. He reports current drug use. Drug: Marijuana. Allergies:  Allergies  Allergen Reactions   Opana [Oxymorphone Hcl] Itching   Tramadol Itching   Medications Prior to Admission  Medication Sig Dispense Refill   albuterol (VENTOLIN HFA) 108 (90 Base) MCG/ACT inhaler Inhale 2 puffs into the lungs every 6 (six) hours as needed for wheezing or shortness of breath. 18 g 11   [EXPIRED] Ampicillin-Sulbactam 3  g in sodium chloride 0.9 % 100 mL Inject 3 g into the vein every 12 (twelve) hours for 5 days.     aspirin EC 81 MG tablet Take 81 mg by mouth daily. Swallow whole.     atorvastatin (LIPITOR) 80 MG tablet Take 1 tablet (80 mg total) by mouth daily. 30 tablet 1   clopidogrel (PLAVIX) 75 MG tablet Take 1 tablet (75 mg total) by mouth daily. Please take first dose at 9 pm on 12/4 30 tablet 1   feeding supplement (ENSURE ENLIVE / ENSURE PLUS) LIQD Take 237 mLs by mouth 2 (two) times daily between meals.     fluticasone (FLONASE) 50 MCG/ACT nasal spray Place 1 spray into both nostrils as needed for allergies or rhinitis. (Patient not taking: Reported on 02/16/2023)     insulin aspart (NOVOLOG) 100 UNIT/ML injection 0-6 Units, Subcutaneous, 3 times daily with meals, CBG < 70: Implement Hypoglycemia measures CBG 70 - 120: 0 units CBG 121 - 150: 0 units CBG 151 - 200: 1 unit CBG 201-250: 2 units CBG 251-300: 3 units CBG 301-350: 4 units CBG 351-400: 5 units CBG > 400: Give 6 units and call MD     [EXPIRED] micafungin in sodium chloride 0.9 % 100 mL 200 mg intravenously every 24 hours     midodrine (PROAMATINE) 10 MG tablet Take 1 tablet (10 mg total) by mouth 3 (three) times daily with meals.     midodrine (PROAMATINE) 10 MG tablet Take  1 tablet (10 mg total) by mouth every dialysis (give 30- 60 min prior to each HD session).     multivitamin (RENA-VIT) TABS tablet Take 1 tablet by mouth at bedtime.     nitroGLYCERIN (NITROSTAT) 0.4 MG SL tablet Place 1 tablet (0.4 mg total) under the tongue every 5 (five) minutes as needed for chest pain.     Nystatin (GERHARDT'S BUTT CREAM) CREA Apply 1 Application topically 4 (four) times daily.     pantoprazole (PROTONIX) 40 MG tablet Take 1 tablet (40 mg total) by mouth 2 (two) times daily.     SYMBICORT 160-4.5 MCG/ACT inhaler INHALE 2 PUFFS INTO THE LUNGS TWICE DAILY. 10.2 g 6      Home:     Functional History:    Functional Status:  Mobility:          ADL:    Cognition:      Physical Exam: There were no vitals taken for this visit. Physical Exam  No results found for this or any previous visit (from the past 48 hour(s)). No results found.    There were no vitals taken for this visit.  Medical Problem List and Plan: 1. Functional deficits secondary to ***  -patient may *** shower  -ELOS/Goals: ***  2.  Antithrombotics: -DVT/anticoagulation:  Pharmaceutical: Heparin  -antiplatelet therapy: aspirin and Plavix  3. Pain Management: Tylenol as needed  -Lidoderm patches to back  4. Mood/Behavior/Sleep: LCSW to evaluate and provide emotional support  -continue Seroquel 50 mg TID  -clonazepam 0.5 mg TID prn  -Geodon 10 mg IM q 12 hours prn  -antipsychotic agents: n/a  5. Neuropsych/cognition: This patient is not capable of making decisions on his own behalf.  6. Skin/Wound Care: Routine skin care checks  -sacral skin off-loading/protection   7. Fluids/Electrolytes/Nutrition: Strict Is and Os and follow-up chemistries per nephrology  -renal diet/ 1200 cc FR  8: Hypotension: monitor TID and prn  -continue midodrine as needed during dialysis  9: Hyperlipidemia: continue statin  10: CAD s/p CABG 2023: on DAPT and statin  -follows with Dr.   Asa Saunas:  DM-2: A1c = 6.3% (at home on Lantus 22 units at bedtime, Tradjenta 5 mg daily) -monitor PO intake  -continue SSI (0-6 units) does not appear to be requiring coverage  12: ESRD: HD on Tues, Thurs, Sat  13: COPD; chronic home 2L O2 (Symbicort, Ventolin, prednisone 40 mg daily at home)  -continue symbicort  14: GERD: continue Protonix  15: Non-sustained VT/pAF: per cardiology, Dr. Algie Coffer: -continue carvedilol 6.25 mg BID; consider increasing to 12.5 mg BID before starting low dose amiodarone  -consider Eliquis and discontinue asa and Plavix in one week if no active GI bleeding and stable hemoglobin from 8/24           ***  Milinda Antis, PA-C 03/12/2023

## 2023-03-12 NOTE — Progress Notes (Signed)
Fanny Dance, MD  Physician Physical Medicine and Rehabilitation   PMR Pre-admission     Signed   Date of Service: 03/12/2023  3:10 PM  Related encounter: Admission (Current) from 03/02/2023 in [facility]   Signed     Expand All Collapse All  PMR Admission Coordinator Pre-Admission Assessment   Patient: [name] [name] is an 72 y.o., male MRN: [id number] DOB: March 22, 1951 Height: 5\' 6"  (1.676 m) Weight: 88.8 kg   Insurance Information HMO:     PPO:      PCP:      IPA:      80/20:      OTHER:  PRIMARY: Medicare A/B      Policy#: [id number]       Subscriber: [name] [name] CM Name:       Phone#:      Fax#:  Pre-Cert#: verified Health and safety inspector:  Benefits:  Phone #:      Name:  Eff. Date: 08/17/09 A/B     Deduct: $1632      Out of Pocket Max: n/a      Life Max:  CIR: 100%      SNF: 20 full days Outpatient: 80%     Co-Pay: 20% Home Health: 100%      Co-Pay:  DME: 80%     Co-Pay: 20% Providers:  SECONDARYRaphael Gibney of [place]      Policy#: [id number]      Phone#: [phone]   Financial Counselor:       Phone#:    The "Data Collection Information Summary" for patients in Inpatient Rehabilitation Facilities with attached "Privacy Act Statement-Health Care Records" was provided and verbally reviewed with: Patient and Family   Emergency Contact Information Contact Information       Name Relation Home Work Mobile    Witczak,Mary Spouse [phone]   [phone]         Other Contacts   None on File        Current Medical History  Patient Admitting Diagnosis: debility, encephalopathy   History of Present Illness: [name] [name] is a 72 y/o male with PMH of CABG (Nov 23), COPD, DM2, GERD, hypotension on midodrine, and ESRD on PD who was admitted to [facility] on 02/15/23 with c/o AMS and shortness of breath over a period of a few days.  Workup revealed sepsis 2/2 catheter peritonitis from PD catheter and polymicrobial bacteremia/fungemia.  Hospital course was complicated by  worsening encephalopathy and hypoxemia requiring transfer to the ICU.  He was transitioned to hemodialysis on TRS schedule.  He was started on Unasyn and micafungin.  He transitioned to [facility] on 8/16 for further care.  He was weaned to room air and tolerating cpap at night.  [name] [name] started on coreg for NSVT.  [name] [name] has required PRN doses of geodon for agitation which appears to have improved.  CTH on 8/20 no acute or reversible finding.  [name] [name] c/o back pain on 8/23 and imaging was order but [name] [name] not able to tolerate so was cancelled.  Therapy ongoing and recommendations are for CIR.    Patient's medical record from [facility] and [facility] has been reviewed by the rehabilitation admission coordinator and physician.   Past Medical History      Past Medical History:  Diagnosis Date   Anemia     Arthritis     Asthma     Bell palsy     CAD (coronary artery disease)  a. 2014 MV: abnl w/ infap ischemia; b. 03/2013 Cath: aneurysmal bleb in the LAD w/ otw nonobs dzs-->Med Rx.   Chronic back pain     Chronic knee pain      a. 09/2015 s/p R TKA.   Chronic pain     Chronic shoulder pain     Chronic sinusitis     COPD (chronic obstructive pulmonary disease) (HCC)     Diabetes mellitus without complication (HCC)      type II    ESRD on peritoneal dialysis (HCC)      on peritoneal dialysis, [facility]    Essential hypertension     GERD (gastroesophageal reflux disease)     Gout     Gout     Hepatomegaly      noted on noncontrast CT 2015   History of hiatal hernia     Hyperlipidemia     Lateral meniscus tear     Obesity      Truncal   Obstructive sleep apnea      does not use cpap    On home oxygen therapy      uses 2l when is going somewhere per patient    PUD (peptic ulcer disease)      remote, reports f/u EGD about 8 years ago unremarkable    Reactive airway disease      related to exposure to chemical during 9/11   Sinusitis     Vitamin D deficiency             Has the patient had major surgery during 100 days prior to admission? Yes   Family History   family history includes Arthritis in an other family member; Asthma in an other family member; Cancer in his mother; Diabetes in his father, mother, and sister; Hypertension in his father, mother, and sister; Lung disease in an other family member.   Current Medications Current Medications  No current facility-administered medications for this encounter.     Patients Current Diet: Diet reg/thin   Precautions / Restrictions Precautions: Fall Precaution Comments: watch sats Weight Bearing Restrictions: No     Has the patient had 2 or more falls or a fall with injury in the past year? No   Prior Activity Level Limited Community (1-2x/wk): indep prior to admit, managing ADLs, driving     Prior Functional Level Self Care: Did the patient need help bathing, dressing, using the toilet or eating? Independent   Indoor Mobility: Did the patient need assistance with walking from room to room (with or without device)? Independent   Stairs: Did the patient need assistance with internal or external stairs (with or without device)? Independent   Functional Cognition: Did the patient need help planning regular tasks such as shopping or remembering to take medications? Independent   Patient Information Are you of Hispanic, Latino/a,or Spanish origin?: X. Patient unable to respond What is your race?: X. Patient unable to respond Do you need or want an interpreter to communicate with a doctor or health care staff?: 9. Unable to respond   Patient's Response To:  Health Literacy and Transportation Is the patient able to respond to health literacy and transportation needs?: No Health Literacy - How often do you need to have someone help you when you read instructions, pamphlets, or other written material from your doctor or pharmacy?: Patient unable to respond   Home Assistive Devices /  Research scientist (physical sciences) (2 wheels); Crutches; BSC/3in1     Prior Device Use: Indicate  devices/aids used by the patient prior to current illness, exacerbation or injury?  cane    Prior Functional Level Current Functional Level  Bed Mobility  Independent Min assist   Transfers  Independent  Mod assist   Mobility - Walk/Wheelchair  Independent  Mod assist   Upper Body Dressing  Independent  Min assist   Lower Body Dressing  Independent  Min assist   Grooming  Independent  Min assist   Eating/Drinking  Independent  Min assist   Toilet Transfer  Independent  Mod assist   Bladder Continence   continent  incontinent   Bowel Management  continent  incontinent   Stair Climbing  Independent      Communication  Indep  mod assist   Memory  Indep max assist       Special Needs/ Care Considerations Dialysis: Peritoneal   Previous Home Environment (from acute therapy documentation) Living Arrangements: Spouse/significant other  Lives With: Spouse Available Help at Discharge: Family; Available 24 hours/day Type of Home: House Home Layout: One level; Laundry or work area in [employer] Access: Stairs to enter Entrance Stairs-Rails: Acupuncturist of Steps: 1 Bathroom Shower/Tub: Engineer, manufacturing systems: Handicapped height     Discharge Living Setting Plans for Discharge Living Setting: Patient's home; Lives with (comment) (spouse) Type of Home at Discharge: House Discharge Home Layout: One level; Laundry or work area in basement Discharge Home Access: Stairs to enter Entrance Stairs-Rails: Acupuncturist of Steps: 1 Discharge Bathroom Shower/Tub: Tub/shower unit Discharge Bathroom Toilet: Handicapped height Discharge Bathroom Accessibility: Yes How Accessible: Accessible via walker     Social/Family/Support Systems Patient Roles: Spouse Anticipated Caregiver: spouse, Sier Ricklefs Anticipated Caregiver's  Contact Information: [phone] Ability/Limitations of Caregiver: n/a Caregiver Availability: 24/7 Discharge Plan Discussed with Primary Caregiver: Yes Is Caregiver In Agreement with Plan?: Yes Does Caregiver/Family have Issues with Lodging/Transportation while [name] [name] is in Rehab?: No     Goals Patient/Family Goal for Rehab: [name] [name]/OT supervision, SLP min assist Expected length of stay: 14-16 days Additional Information: Discharge plan: return home with spouse 24/7, she can provide assist for ADLs as long as he can mobilize with supervision, reports he was "sharp" at baseline [name] [name]/Family Agrees to Admission and willing to participate: Yes Program Orientation Provided & Reviewed with [name] [name]/Caregiver Including Roles  & Responsibilities: Yes  Barriers to Discharge: Behavior     Decrease burden of Care through IP rehab admission: n/a   Possible need for SNF placement upon discharge: Not anticipated.  Plan discharge home with [name] [name]'s spouse providing 24/7 supervision/assist.    Patient Condition: I have reviewed medical records from [facility] and [facility], spoken with CM, and patient and spouse. I met with patient at the bedside and discussed via phone for inpatient rehabilitation assessment.  Patient will benefit from ongoing [name] [name], OT, and SLP, can actively participate in 3 hours of therapy a day 5 days of the week, and can make measurable gains during the admission.  Patient will also benefit from the coordinated team approach during an Inpatient Acute Rehabilitation admission.  The patient will receive intensive therapy as well as Rehabilitation physician, nursing, social worker, and care management interventions.  Due to bladder management, bowel management, safety, skin/wound care, disease management, medication administration, pain management, and patient education the patient requires 24 hour a day rehabilitation nursing.  The patient is currently min to mod assist with mobility and basic  ADLs.  Discharge setting and therapy post discharge at home with home health is  anticipated.  Patient has agreed to participate in the Acute Inpatient Rehabilitation Program and will admit today.   Preadmission Screen Completed By:  Stephania Fragmin, [name] [name], DPT 03/12/2023 3:26 PM ______________________________________________________________________   Discussed status with Dr. Natale Lay on 03/12/23  at 3:30 PM  and received approval for admission today.   Admission Coordinator:  Stephania Fragmin, [name] [name], DPT time  3:30 PM Dorna Bloom  03/12/23     Assessment/Plan: Diagnosis: debility due to sepsis secondary to catheter peritonitis from PD catheter with resultant encephalopathy and hypoxemia Does the need for close, 24 hr/day Medical supervision in concert with the patient's rehab needs make it unreasonable for this patient to be served in a less intensive setting? Yes Co-Morbidities requiring supervision/potential complications: COPD, GERD, DM2, CAD, hypotension, HLD, VT/PaF,ESRD Due to bladder management, bowel management, safety, skin/wound care, disease management, medication administration, pain management, and patient education, does the patient require 24 hr/day rehab nursing? Yes Does the patient require coordinated care of a physician, rehab nurse, [name] [name], OT, and SLP to address physical and functional deficits in the context of the above medical diagnosis(es)? Yes Addressing deficits in the following areas: balance, endurance, locomotion, strength, transferring, bowel/bladder control, bathing, dressing, feeding, grooming, toileting, cognition, speech, language, swallowing, and psychosocial support Can the patient actively participate in an intensive therapy program of at least 3 hrs of therapy 5 days a week? Yes The potential for patient to make measurable gains while on inpatient rehab is excellent Anticipated functional outcomes upon discharge from inpatient rehab: supervision [name] [name], supervision OT, min  assist SLP Estimated rehab length of stay to reach the above functional goals is: 14-16 Anticipated discharge destination: Home 10. Overall Rehab/Functional Prognosis: good   MD Signature Fanny Dance          Revision History

## 2023-03-12 NOTE — PMR Pre-admission (Signed)
PMR Admission Coordinator Pre-Admission Assessment  Patient: [name] [name] is an 72 y.o., male MRN: [id number] DOB: [birth year] Height: 5\' 6"  (1.676 m) Weight: 88.8 kg  Insurance Information HMO:     PPO:      PCP:      IPA:      80/20:      OTHER:  PRIMARY: Medicare A/B      Policy#: [id number]       Subscriber: pt CM Name:       Phone#:      Fax#:  Pre-Cert#: verified Health and safety inspector:  Benefits:  Phone #:      Name:  Eff. Date: 08/17/09 A/B     Deduct: $1632      Out of Pocket Max: n/a      Life Max:  CIR: 100%      SNF: 20 full days Outpatient: 80%     Co-Pay: 20% Home Health: 100%      Co-Pay:  DME: 80%     Co-Pay: 20% Providers:  SECONDARYRaphael Gibney of [place]      Policy#: [id number]      Phone#: [phone]  Financial Counselor:       Phone#:   The "Data Collection Information Summary" for patients in Inpatient Rehabilitation Facilities with attached "Privacy Act Statement-Health Care Records" was provided and verbally reviewed with: Patient and Family  Emergency Contact Information Contact Information     Name Relation Home Work Mobile   Klippel,Mary Spouse [phone]  [phone]      Other Contacts   None on File     Current Medical History  Patient Admitting Diagnosis: debility, encephalopathy  History of Present Illness: Pt is a 72 y/o male with PMH of CABG (Nov 23), COPD, DM2, GERD, hypotension on midodrine, and ESRD on PD who was admitted to [facility] on 02/15/23 with c/o AMS and shortness of breath over a period of a few days.  Workup revealed sepsis 2/2 catheter peritonitis from PD catheter and polymicrobial bacteremia/fungemia.  Hospital course was complicated by worsening encephalopathy and hypoxemia requiring transfer to the ICU.  He was transitioned to hemodialysis on TRS schedule.  He was started on Unasyn and micafungin.  He transitioned to [facility] on 8/16 for further care.  He was weaned to room air and tolerating cpap at night.  Pt  started on coreg for NSVT.  Pt has required PRN doses of geodon for agitation which appears to have improved.  CTH on 8/20 no acute or reversible finding.  Pt c/o back pain on 8/23 and imaging was order but pt not able to tolerate so was cancelled.  Therapy ongoing and recommendations are for CIR.     Patient's medical record from [facility] and [facility] has been reviewed by the rehabilitation admission coordinator and physician.  Past Medical History  Past Medical History:  Diagnosis Date   Anemia    Arthritis    Asthma    Bell palsy    CAD (coronary artery disease)    a. 2014 MV: abnl w/ infap ischemia; b. 03/2013 Cath: aneurysmal bleb in the LAD w/ otw nonobs dzs-->Med Rx.   Chronic back pain    Chronic knee pain    a. 09/2015 s/p R TKA.   Chronic pain    Chronic shoulder pain    Chronic sinusitis    COPD (chronic obstructive pulmonary disease) (HCC)    Diabetes mellitus without complication (HCC)  type II    ESRD on peritoneal dialysis (HCC)    on peritoneal dialysis, [facility] [place]   Essential hypertension    GERD (gastroesophageal reflux disease)    Gout    Gout    Hepatomegaly    noted on noncontrast CT 2015   History of hiatal hernia    Hyperlipidemia    Lateral meniscus tear    Obesity    Truncal   Obstructive sleep apnea    does not use cpap    On home oxygen therapy    uses 2l when is going somewhere per patient    PUD (peptic ulcer disease)    remote, reports f/u EGD about 8 years ago unremarkable    Reactive airway disease    related to exposure to chemical during 9/11   Sinusitis    Vitamin D deficiency     Has the patient had major surgery during 100 days prior to admission? Yes  Family History   family history includes Arthritis in an other family member; Asthma in an other family member; Cancer in his mother; Diabetes in his father, mother, and sister; Hypertension in his father, mother, and sister; Lung disease in an other  family member.  Current Medications No current facility-administered medications for this encounter.  Patients Current Diet: Diet reg/thin  Precautions / Restrictions Precautions: Fall Precaution Comments: watch sats Weight Bearing Restrictions: No    Has the patient had 2 or more falls or a fall with injury in the past year? No  Prior Activity Level Limited Community (1-2x/wk): indep prior to admit, managing ADLs, driving   Prior Functional Level Self Care: Did the patient need help bathing, dressing, using the toilet or eating? Independent  Indoor Mobility: Did the patient need assistance with walking from room to room (with or without device)? Independent  Stairs: Did the patient need assistance with internal or external stairs (with or without device)? Independent  Functional Cognition: Did the patient need help planning regular tasks such as shopping or remembering to take medications? Independent  Patient Information Are you of Hispanic, Latino/a,or Spanish origin?: X. Patient unable to respond What is your race?: X. Patient unable to respond Do you need or want an interpreter to communicate with a doctor or health care staff?: 9. Unable to respond  Patient's Response To:  Health Literacy and Transportation Is the patient able to respond to health literacy and transportation needs?: No Health Literacy - How often do you need to have someone help you when you read instructions, pamphlets, or other written material from your doctor or pharmacy?: Patient unable to respond  Home Assistive Devices / Research scientist (physical sciences) (2 wheels); Crutches; BSC/3in1   Prior Device Use: Indicate devices/aids used by the patient prior to current illness, exacerbation or injury?  cane   Prior Functional Level Current Functional Level  Bed Mobility        Transfers         Mobility - Walk/Wheelchair         Upper Body Dressing         Lower Body Dressing          Grooming         Eating/Drinking         Toilet Transfer         Bladder Continence          Bowel Management         Stair Climbing  Communication         Memory          Special Needs/ Care Considerations Dialysis: Peritoneal  Previous Home Environment (from acute therapy documentation) Living Arrangements: Spouse/significant other  Lives With: Spouse Available Help at Discharge: Family; Available 24 hours/day Type of Home: House Home Layout: One level; Laundry or work area in [employer] Access: Stairs to enter Entrance Stairs-Rails: Acupuncturist of Steps: 1 Bathroom Shower/Tub: Engineer, manufacturing systems: Handicapped height   Discharge Living Setting Plans for Discharge Living Setting: Patient's home; Lives with (comment) (spouse) Type of Home at Discharge: House Discharge Home Layout: One level; Laundry or work area in basement Discharge Home Access: Stairs to enter Entrance Stairs-Rails: Acupuncturist of Steps: 1 Discharge Bathroom Shower/Tub: Tub/shower unit Discharge Bathroom Toilet: Handicapped height Discharge Bathroom Accessibility: Yes How Accessible: Accessible via walker   Social/Family/Support Systems Patient Roles: Spouse Anticipated Caregiver: spouse, Jeanclaude Quade Anticipated Caregiver's Contact Information: [phone] Ability/Limitations of Caregiver: n/a Caregiver Availability: 24/7 Discharge Plan Discussed with Primary Caregiver: Yes Is Caregiver In Agreement with Plan?: Yes Does Caregiver/Family have Issues with Lodging/Transportation while Pt is in Rehab?: No   Goals Patient/Family Goal for Rehab: PT/OT supervision, SLP min assist Expected length of stay: 14-16 days Additional Information: Discharge plan: return home with spouse 24/7, she can provide assist for ADLs as long as he can mobilize with supervision, reports he was "sharp" at baseline Pt/Family Agrees to Admission  and willing to participate: Yes Program Orientation Provided & Reviewed with Pt/Caregiver Including Roles  & Responsibilities: Yes  Barriers to Discharge: Behavior   Decrease burden of Care through IP rehab admission: n/a  Possible need for SNF placement upon discharge: Not anticipated.  Plan discharge home with pt's spouse providing 24/7 supervision/assist.   Patient Condition: I have reviewed medical records from [facility] and [facility], spoken with CM, and patient and spouse. I met with patient at the bedside and discussed via phone for inpatient rehabilitation assessment.  Patient will benefit from ongoing PT, OT, and SLP, can actively participate in 3 hours of therapy a day 5 days of the week, and can make measurable gains during the admission.  Patient will also benefit from the coordinated team approach during an Inpatient Acute Rehabilitation admission.  The patient will receive intensive therapy as well as Rehabilitation physician, nursing, social worker, and care management interventions.  Due to bladder management, bowel management, safety, skin/wound care, disease management, medication administration, pain management, and patient education the patient requires 24 hour a day rehabilitation nursing.  The patient is currently min to mod assist with mobility and basic ADLs.  Discharge setting and therapy post discharge at home with home health is anticipated.  Patient has agreed to participate in the Acute Inpatient Rehabilitation Program and will admit today.  Preadmission Screen Completed By:  Stephania Fragmin, PT, DPT 03/12/2023 3:26 PM ______________________________________________________________________   Discussed status with Dr. Natale Lay on 03/12/23  at 3:30 PM  and received approval for admission today.  Admission Coordinator:  Stephania Fragmin, PT, DPT time  3:30 PM Dorna Bloom  03/12/23    Assessment/Plan: Diagnosis: debility due to sepsis secondary to catheter  peritonitis from PD catheter with resultant encephalopathy and hypoxemia Does the need for close, 24 hr/day Medical supervision in concert with the patient's rehab needs make it unreasonable for this patient to be served in a less intensive setting? Yes Co-Morbidities requiring supervision/potential complications: COPD, GERD, DM2, CAD, hypotension, HLD, VT/PaF,ESRD Due  to bladder management, bowel management, safety, skin/wound care, disease management, medication administration, pain management, and patient education, does the patient require 24 hr/day rehab nursing? Yes Does the patient require coordinated care of a physician, rehab nurse, PT, OT, and SLP to address physical and functional deficits in the context of the above medical diagnosis(es)? Yes Addressing deficits in the following areas: balance, endurance, locomotion, strength, transferring, bowel/bladder control, bathing, dressing, feeding, grooming, toileting, cognition, speech, language, swallowing, and psychosocial support Can the patient actively participate in an intensive therapy program of at least 3 hrs of therapy 5 days a week? Yes The potential for patient to make measurable gains while on inpatient rehab is excellent Anticipated functional outcomes upon discharge from inpatient rehab: supervision PT, supervision OT, min assist SLP Estimated rehab length of stay to reach the above functional goals is: 14-16 Anticipated discharge destination: Home 10. Overall Rehab/Functional Prognosis: good  MD Signature Fanny Dance

## 2023-03-12 NOTE — Progress Notes (Signed)
Fanny Dance, MD  Physician Physical Medicine and Rehabilitation   PMR Pre-admission     Signed   Date of Service: 03/12/2023  3:10 PM  Related encounter: Admission (Discharged) from 03/02/2023 in [facility]   Signed     Expand All Collapse All  PMR Admission Coordinator Pre-Admission Assessment   Patient: [name] [name] is an 72 y.o., male MRN: [id number] DOB: 04-08-1951 Height: 5\' 6"  (1.676 m) Weight: 88.8 kg   Insurance Information HMO:     PPO:      PCP:      IPA:      80/20:      OTHER:  PRIMARY: Medicare A/B      Policy#: [id number]       Subscriber: pt CM Name:       Phone#:      Fax#:  Pre-Cert#: verified Health and safety inspector:  Benefits:  Phone #:      Name:  Eff. Date: 08/17/09 A/B     Deduct: $1632      Out of Pocket Max: n/a      Life Max:  CIR: 100%      SNF: 20 full days Outpatient: 80%     Co-Pay: 20% Home Health: 100%      Co-Pay:  DME: 80%     Co-Pay: 20% Providers:  SECONDARYRaphael Gibney of [place]      Policy#: [id number]      Phone#: [phone]   Financial Counselor:       Phone#:    The "Data Collection Information Summary" for patients in Inpatient Rehabilitation Facilities with attached "Privacy Act Statement-Health Care Records" was provided and verbally reviewed with: Patient and Family   Emergency Contact Information Contact Information       Name Relation Home Work Mobile    Hiltner,Mary Spouse [phone]   [phone]         Other Contacts   None on File        Current Medical History  Patient Admitting Diagnosis: debility, encephalopathy   History of Present Illness: Pt is a 71 y/o male with PMH of CABG (Nov 23), COPD, DM2, GERD, hypotension on midodrine, and ESRD on PD who was admitted to [facility] on 02/15/23 with c/o AMS and shortness of breath over a period of a few days.  Workup revealed sepsis 2/2 catheter peritonitis from PD catheter and polymicrobial bacteremia/fungemia.  Hospital course was complicated by  worsening encephalopathy and hypoxemia requiring transfer to the ICU.  He was transitioned to hemodialysis on TRS schedule.  He was started on Unasyn and micafungin.  He transitioned to [facility] on 8/16 for further care.  He was weaned to room air and tolerating cpap at night.  Pt started on coreg for NSVT.  Pt has required PRN doses of geodon for agitation which appears to have improved.  CTH on 8/20 no acute or reversible finding.  Pt c/o back pain on 8/23 and imaging was order but pt not able to tolerate so was cancelled.  Therapy ongoing and recommendations are for CIR.    Patient[facility] has been reviewed by the rehabilitation admission coordinator and physician.   Past Medical History      Past Medical History:  Diagnosis Date   Anemia     Arthritis     Asthma     Bell palsy     CAD (coronary artery disease)  a. 2014 MV: abnl w/ infap ischemia; b. 03/2013 Cath: aneurysmal bleb in the LAD w/ otw nonobs dzs-->Med Rx.   Chronic back pain     Chronic knee pain      a. 09/2015 s/p R TKA.   Chronic pain     Chronic shoulder pain     Chronic sinusitis     COPD (chronic obstructive pulmonary disease) (HCC)     Diabetes mellitus without complication (HCC)      type II    ESRD on peritoneal dialysis (HCC)      on peritoneal dialysis, [facility]   Essential hypertension     GERD (gastroesophageal reflux disease)     Gout     Gout     Hepatomegaly      noted on noncontrast CT 2015   History of hiatal hernia     Hyperlipidemia     Lateral meniscus tear     Obesity      Truncal   Obstructive sleep apnea      does not use cpap    On home oxygen therapy      uses 2l when is going somewhere per patient    PUD (peptic ulcer disease)      remote, reports f/u EGD about 8 years ago unremarkable    Reactive airway disease      related to exposure to chemical during 9/11   Sinusitis     Vitamin D deficiency             Has the patient had major surgery during 100 days prior to admission? Yes   Family History   family history includes Arthritis in an other family member; Asthma in an other family member; Cancer in his mother; Diabetes in his father, mother, and sister; Hypertension in his father, mother, and sister; Lung disease in an other family member.   Current Medications Current Medications  No current facility-administered medications for this encounter.     Patients Current Diet: Diet reg/thin   Precautions / Restrictions Precautions: Fall Precaution Comments: watch sats Weight Bearing Restrictions: No     Has the patient had 2 or more falls or a fall with injury in the past year? No   Prior Activity Level Limited Community (1-2x/wk): indep prior to admit, managing ADLs, driving     Prior Functional Level Self Care: Did the patient need help bathing, dressing, using the toilet or eating? Independent   Indoor Mobility: Did the patient need assistance with walking from room to room (with or without device)? Independent   Stairs: Did the patient need assistance with internal or external stairs (with or without device)? Independent   Functional Cognition: Did the patient need help planning regular tasks such as shopping or remembering to take medications? Independent   Patient Information Are you of Hispanic, Latino/a,or Spanish origin?: X. Patient unable to respond What is your race?: X. Patient unable to respond Do you need or want an interpreter to communicate with a doctor or health care staff?: 9. Unable to respond   Patient's Response To:  Health Literacy and Transportation Is the patient able to respond to health literacy and transportation needs?: No Health Literacy - How often do you need to have someone help you when you read instructions, pamphlets, or other written material from your doctor or pharmacy?: Patient unable to respond   Home Assistive Devices /  Research scientist (physical sciences) (2 wheels); Crutches; BSC/3in1     Prior Device Use: Indicate  devices/aids used by the patient prior to current illness, exacerbation or injury?  cane     Prior Functional Level Current Functional Level  Bed Mobility       Transfers        Mobility - Walk/Wheelchair        Upper Body Dressing        Lower Body Dressing        Grooming        Eating/Drinking        Toilet Transfer        Bladder Continence         Bowel Management        Stair Climbing        Communication        Memory         Special Needs/ Care Considerations Dialysis: Peritoneal   Previous Home Environment (from acute therapy documentation) Living Arrangements: Spouse/significant other  Lives With: Spouse Available Help at Discharge: Family; Available 24 hours/day Type of Home: House Home Layout: One level; Laundry or work area in [employer] Access: Stairs to enter Entrance Stairs-Rails: Acupuncturist of Steps: 1 Bathroom Shower/Tub: Engineer, manufacturing systems: Handicapped height     Discharge Living Setting Plans for Discharge Living Setting: Patient's home; Lives with (comment) (spouse) Type of Home at Discharge: House Discharge Home Layout: One level; Laundry or work area in basement Discharge Home Access: Stairs to enter Entrance Stairs-Rails: Acupuncturist of Steps: 1 Discharge Bathroom Shower/Tub: Tub/shower unit Discharge Bathroom Toilet: Handicapped height Discharge Bathroom Accessibility: Yes How Accessible: Accessible via walker     Social/Family/Support Systems Patient Roles: Spouse Anticipated Caregiver: spouse, Elis Simonini Anticipated Caregiver's Contact Information: [phone] Ability/Limitations of Caregiver: n/a Caregiver Availability: 24/7 Discharge Plan Discussed with Primary Caregiver: Yes Is Caregiver In Agreement with Plan?: Yes Does Caregiver/Family have Issues with Lodging/Transportation  while Pt is in Rehab?: No     Goals Patient/Family Goal for Rehab: PT/OT supervision, SLP min assist Expected length of stay: 14-16 days Additional Information: Discharge plan: return home with spouse 24/7, she can provide assist for ADLs as long as he can mobilize with supervision, reports he was "sharp" at baseline Pt/Family Agrees to Admission and willing to participate: Yes Program Orientation Provided & Reviewed with Pt/Caregiver Including Roles  & Responsibilities: Yes  Barriers to Discharge: Behavior     Decrease burden of Care through IP rehab admission: n/a   Possible need for SNF placement upon discharge: Not anticipated.  Plan discharge home with pt's spouse providing 24/7 supervision/assist.    Patient Condition: I have reviewed medical records from [facility] and [facility], spoken with CM, and patient and spouse. I met with patient at the bedside and discussed via phone for inpatient rehabilitation assessment.  Patient will benefit from ongoing PT, OT, and SLP, can actively participate in 3 hours of therapy a day 5 days of the week, and can make measurable gains during the admission.  Patient will also benefit from the coordinated team approach during an Inpatient Acute Rehabilitation admission.  The patient will receive intensive therapy as well as Rehabilitation physician, nursing, social worker, and care management interventions.  Due to bladder management, bowel management, safety, skin/wound care, disease management, medication administration, pain management, and patient education the patient requires 24 hour a day rehabilitation nursing.  The patient is currently min to mod assist with mobility and basic ADLs.  Discharge setting and therapy post discharge at home with home health is  anticipated.  Patient has agreed to participate in the Acute Inpatient Rehabilitation Program and will admit today.   Preadmission Screen Completed By:  Stephania Fragmin, PT, DPT  03/12/2023 3:26 PM ______________________________________________________________________   Discussed status with Dr. Natale Lay on 03/12/23  at 3:30 PM  and received approval for admission today.   Admission Coordinator:  Stephania Fragmin, PT, DPT time  3:30 PM Dorna Bloom  03/12/23     Assessment/Plan: Diagnosis: debility due to sepsis secondary to catheter peritonitis from PD catheter with resultant encephalopathy and hypoxemia Does the need for close, 24 hr/day Medical supervision in concert with the patient's rehab needs make it unreasonable for this patient to be served in a less intensive setting? Yes Co-Morbidities requiring supervision/potential complications: COPD, GERD, DM2, CAD, hypotension, HLD, VT/PaF,ESRD Due to bladder management, bowel management, safety, skin/wound care, disease management, medication administration, pain management, and patient education, does the patient require 24 hr/day rehab nursing? Yes Does the patient require coordinated care of a physician, rehab nurse, PT, OT, and SLP to address physical and functional deficits in the context of the above medical diagnosis(es)? Yes Addressing deficits in the following areas: balance, endurance, locomotion, strength, transferring, bowel/bladder control, bathing, dressing, feeding, grooming, toileting, cognition, speech, language, swallowing, and psychosocial support Can the patient actively participate in an intensive therapy program of at least 3 hrs of therapy 5 days a week? Yes The potential for patient to make measurable gains while on inpatient rehab is excellent Anticipated functional outcomes upon discharge from inpatient rehab: supervision PT, supervision OT, min assist SLP Estimated rehab length of stay to reach the above functional goals is: 14-16 Anticipated discharge destination: Home 10. Overall Rehab/Functional Prognosis: good   MD Signature Fanny Dance          Revision History

## 2023-03-13 ENCOUNTER — Inpatient Hospital Stay (HOSPITAL_COMMUNITY): Payer: Medicare Other

## 2023-03-13 DIAGNOSIS — N186 End stage renal disease: Secondary | ICD-10-CM | POA: Diagnosis not present

## 2023-03-13 DIAGNOSIS — R4182 Altered mental status, unspecified: Secondary | ICD-10-CM | POA: Diagnosis not present

## 2023-03-13 DIAGNOSIS — N250 Renal osteodystrophy: Secondary | ICD-10-CM | POA: Diagnosis not present

## 2023-03-13 DIAGNOSIS — R5381 Other malaise: Secondary | ICD-10-CM | POA: Diagnosis not present

## 2023-03-13 DIAGNOSIS — G9341 Metabolic encephalopathy: Secondary | ICD-10-CM | POA: Diagnosis not present

## 2023-03-13 DIAGNOSIS — Z992 Dependence on renal dialysis: Secondary | ICD-10-CM | POA: Diagnosis not present

## 2023-03-13 DIAGNOSIS — D631 Anemia in chronic kidney disease: Secondary | ICD-10-CM | POA: Diagnosis not present

## 2023-03-13 DIAGNOSIS — I120 Hypertensive chronic kidney disease with stage 5 chronic kidney disease or end stage renal disease: Secondary | ICD-10-CM | POA: Diagnosis not present

## 2023-03-13 LAB — CBC
HCT: 27.5 % — ABNORMAL LOW (ref 39.0–52.0)
Hemoglobin: 8.1 g/dL — ABNORMAL LOW (ref 13.0–17.0)
MCH: 27.6 pg (ref 26.0–34.0)
MCHC: 29.5 g/dL — ABNORMAL LOW (ref 30.0–36.0)
MCV: 93.9 fL (ref 80.0–100.0)
Platelets: 188 10*3/uL (ref 150–400)
RBC: 2.93 MIL/uL — ABNORMAL LOW (ref 4.22–5.81)
RDW: 19.1 % — ABNORMAL HIGH (ref 11.5–15.5)
WBC: 4.3 10*3/uL (ref 4.0–10.5)
nRBC: 0 % (ref 0.0–0.2)

## 2023-03-13 LAB — HEPATITIS B SURFACE ANTIGEN: Hepatitis B Surface Ag: NONREACTIVE

## 2023-03-13 LAB — RESPIRATORY PANEL BY PCR

## 2023-03-13 LAB — BLOOD GAS, VENOUS
Acid-Base Excess: 3.4 mmol/L — ABNORMAL HIGH (ref 0.0–2.0)
Bicarbonate: 30.4 mmol/L — ABNORMAL HIGH (ref 20.0–28.0)
O2 Saturation: 41.1 %
Patient temperature: 37
pCO2, Ven: 55 mmHg (ref 44–60)
pH, Ven: 7.35 (ref 7.25–7.43)
pO2, Ven: 33 mmHg (ref 32–45)

## 2023-03-13 LAB — GLUCOSE, CAPILLARY
Glucose-Capillary: 112 mg/dL — ABNORMAL HIGH (ref 70–99)
Glucose-Capillary: 131 mg/dL — ABNORMAL HIGH (ref 70–99)
Glucose-Capillary: 121 mg/dL — ABNORMAL HIGH (ref 70–99)
Glucose-Capillary: 125 mg/dL — ABNORMAL HIGH (ref 70–99)

## 2023-03-13 LAB — RENAL FUNCTION PANEL
Albumin: 1.6 g/dL — ABNORMAL LOW (ref 3.5–5.0)
Anion gap: 13 (ref 5–15)
BUN: 48 mg/dL — ABNORMAL HIGH (ref 8–23)
CO2: 25 mmol/L (ref 22–32)
Calcium: 8.6 mg/dL — ABNORMAL LOW (ref 8.9–10.3)
Chloride: 99 mmol/L (ref 98–111)
Creatinine, Ser: 11.38 mg/dL — ABNORMAL HIGH (ref 0.61–1.24)
GFR, Estimated: 4 mL/min — ABNORMAL LOW (ref >60)
Glucose, Bld: 145 mg/dL — ABNORMAL HIGH (ref 70–99)
Phosphorus: 6.4 mg/dL — ABNORMAL HIGH (ref 2.5–4.6)
Potassium: 4.8 mmol/L (ref 3.5–5.1)
Sodium: 137 mmol/L (ref 135–145)

## 2023-03-13 LAB — PROCALCITONIN: Procalcitonin: 4.56 ng/mL

## 2023-03-13 LAB — TROPONIN I (HIGH SENSITIVITY): Troponin I (High Sensitivity): 109 ng/L (ref <18)

## 2023-03-13 LAB — LACTIC ACID, PLASMA: Lactic Acid, Venous: 2.1 mmol/L (ref 0.5–1.9)

## 2023-03-13 LAB — AMMONIA: Ammonia: 29 umol/L (ref 9–35)

## 2023-03-13 MED ORDER — PREGABALIN 25 MG PO CAPS
25.0000 mg | ORAL_CAPSULE | Freq: Three times a day (TID) | ORAL | Status: DC | PRN
  Administered 2023-03-14: 25 mg via ORAL
  Filled 2023-03-13: qty 1

## 2023-03-13 MED ORDER — RAMELTEON 8 MG PO TABS
8.0000 mg | ORAL_TABLET | Freq: Every evening | ORAL | Status: DC | PRN
  Administered 2023-03-13 – 2023-03-15 (×3): 8 mg via ORAL
  Filled 2023-03-13 (×5): qty 1

## 2023-03-13 MED ORDER — OLANZAPINE 10 MG IM SOLR
5.0000 mg | Freq: Two times a day (BID) | INTRAMUSCULAR | Status: DC | PRN
  Administered 2023-03-14: 5 mg via INTRAMUSCULAR
  Filled 2023-03-13 (×3): qty 10

## 2023-03-13 MED ORDER — RAMELTEON 8 MG PO TABS: 8.0000 mg | ORAL_TABLET | Freq: Every day | ORAL | Status: DC

## 2023-03-13 MED ORDER — ZIPRASIDONE HCL 20 MG PO CAPS
20.0000 mg | ORAL_CAPSULE | Freq: Two times a day (BID) | ORAL | Status: DC
  Administered 2023-03-13 – 2023-03-16 (×6): 20 mg via ORAL
  Filled 2023-03-13 (×7): qty 1

## 2023-03-13 MED ORDER — LIDOCAINE-PRILOCAINE 2.5-2.5 % EX CREA: 1.0000 | TOPICAL_CREAM | CUTANEOUS | Status: DC | PRN

## 2023-03-13 MED ORDER — PENTAFLUOROPROP-TETRAFLUOROETH EX AERO: 1.0000 | INHALATION_SPRAY | CUTANEOUS | Status: DC | PRN

## 2023-03-13 MED ORDER — OXYCODONE HCL 5 MG PO TABS
2.5000 mg | ORAL_TABLET | Freq: Four times a day (QID) | ORAL | Status: DC | PRN
  Administered 2023-03-14: 2.5 mg via ORAL
  Filled 2023-03-13: qty 1

## 2023-03-13 MED ORDER — HEPARIN SODIUM (PORCINE) 1000 UNIT/ML DIALYSIS: 1000.0000 [IU] | INTRAMUSCULAR | Status: DC | PRN

## 2023-03-13 MED ORDER — GABAPENTIN 100 MG PO CAPS
100.0000 mg | ORAL_CAPSULE | Freq: Two times a day (BID) | ORAL | Status: DC
  Filled 2023-03-13: qty 1

## 2023-03-13 MED ORDER — LIDOCAINE HCL (PF) 1 % IJ SOLN: 5.0000 mL | INTRAMUSCULAR | Status: DC | PRN

## 2023-03-13 MED ORDER — CHLORHEXIDINE GLUCONATE CLOTH 2 % EX PADS
6.0000 | MEDICATED_PAD | Freq: Every day | CUTANEOUS | Status: DC
  Administered 2023-03-14 – 2023-03-15 (×2): 6 via TOPICAL

## 2023-03-13 MED ORDER — AMITRIPTYLINE HCL 25 MG PO TABS
25.0000 mg | ORAL_TABLET | Freq: Every day | ORAL | Status: DC
  Administered 2023-03-13: 25 mg via ORAL
  Filled 2023-03-13: qty 1

## 2023-03-13 MED ORDER — ANTICOAGULANT SODIUM CITRATE 4% (200MG/5ML) IV SOLN: 5.0000 mL | Status: DC | PRN

## 2023-03-13 MED ORDER — OLANZAPINE 5 MG PO TBDP
5.0000 mg | ORAL_TABLET | Freq: Two times a day (BID) | ORAL | Status: DC | PRN
  Administered 2023-03-15 – 2023-03-16 (×5): 5 mg via ORAL
  Filled 2023-03-13 (×6): qty 1

## 2023-03-13 MED ORDER — DARBEPOETIN ALFA 150 MCG/0.3ML IJ SOSY
150.0000 ug | PREFILLED_SYRINGE | INTRAMUSCULAR | Status: DC
  Filled 2023-03-13: qty 0.3

## 2023-03-13 MED ORDER — ALTEPLASE 2 MG IJ SOLR: 2.0000 mg | Freq: Once | INTRAMUSCULAR | Status: DC | PRN

## 2023-03-13 MED ORDER — PROSOURCE PLUS PO LIQD
30.0000 mL | Freq: Two times a day (BID) | ORAL | Status: DC
  Administered 2023-03-15 – 2023-04-25 (×24): 30 mL via ORAL
  Filled 2023-03-13 (×34): qty 30

## 2023-03-13 MED ORDER — SEVELAMER CARBONATE 800 MG PO TABS
800.0000 mg | ORAL_TABLET | Freq: Three times a day (TID) | ORAL | Status: DC
  Administered 2023-03-13 – 2023-03-31 (×46): 800 mg via ORAL
  Filled 2023-03-13 (×48): qty 1

## 2023-03-13 MED ORDER — LIDOCAINE 5 % EX PTCH
1.0000 | MEDICATED_PATCH | CUTANEOUS | Status: DC
  Administered 2023-03-16 – 2023-04-15 (×6): 1 via TRANSDERMAL
  Filled 2023-03-13 (×25): qty 1

## 2023-03-13 MED ORDER — INSULIN ASPART 100 UNIT/ML IJ SOLN
0.0000 [IU] | Freq: Three times a day (TID) | INTRAMUSCULAR | Status: DC
  Administered 2023-03-14: 1 [IU] via SUBCUTANEOUS

## 2023-03-13 MED ORDER — HALOPERIDOL LACTATE 5 MG/ML IJ SOLN
2.0000 mg | Freq: Four times a day (QID) | INTRAMUSCULAR | Status: DC | PRN
  Administered 2023-03-13: 2 mg via INTRAMUSCULAR
  Filled 2023-03-13 (×2): qty 0.4

## 2023-03-13 MED ORDER — DARBEPOETIN ALFA 100 MCG/0.5ML IJ SOSY: 100.0000 ug | PREFILLED_SYRINGE | INTRAMUSCULAR | Status: DC

## 2023-03-13 NOTE — Progress Notes (Signed)
Pt verbally abusive yelling cursing. Dr. Malen Gauze and Louis Meckel, PA spoke with pt and told him to not use that language and to stop yelling that if he continued he would have to go back to his room and try dialysis another time. Pt continued yelling, cursing, and communicating threats.  pt needles were pulled per [place] PA will try later for HD after other interventions to help pt calm down. Pt being to try and climb out of bed after being redirected to stay in the bed. He began swinging to hit staff and kicking restraints applied to bilateral arms and legs. Pt transported by HD staff back to his room report was given to [facility] RN at the bedside.

## 2023-03-13 NOTE — Progress Notes (Addendum)
Occupational Therapy Assessment and Plan  Patient Details  Name: [name] [name] MRN: [id number] Date of Birth: 1951-05-11  OT Diagnosis: altered mental status, cognitive deficits, and muscle weakness (generalized) Rehab Potential: Rehab Potential (ACUTE ONLY): Fair ELOS: 12-14   Today's Date: 03/13/2023 OT Individual Time: 4098-1191 OT Individual Time Calculation (min): 60 min  and Today's Date: 03/13/2023 OT Missed Time: 15 Minutes Missed Time Reason: Patient unwilling/refused to participate without medical reason (agitation)    Hospital Problem: Principal Problem:   Debility Active Problems:   Type 2 diabetes mellitus with chronic kidney disease on chronic dialysis, with long-term current use of insulin (HCC)   Acute metabolic encephalopathy   HFrEF (heart failure with reduced ejection fraction) (HCC)   Morbid obesity (HCC)   Encephalopathy   Epistaxis   PAF (paroxysmal atrial fibrillation) (HCC)   Long term (current) use of insulin (HCC)   ESRD on dialysis [facility])   Past Medical History:  Past Medical History:  Diagnosis Date   Anemia    Arthritis    Asthma    Bell palsy    CAD (coronary artery disease)    a. 2014 MV: abnl w/ infap ischemia; b. 03/2013 Cath: aneurysmal bleb in the LAD w/ otw nonobs dzs-->Med Rx.   Chronic back pain    Chronic knee pain    a. 09/2015 s/p R TKA.   Chronic pain    Chronic shoulder pain    Chronic sinusitis    COPD (chronic obstructive pulmonary disease) (HCC)    Diabetes mellitus without complication (HCC)    type II    ESRD on peritoneal dialysis (HCC)    on peritoneal dialysis, [facility]   Essential hypertension    GERD (gastroesophageal reflux disease)    Gout    Gout    Hepatomegaly    noted on noncontrast CT 2015   History of hiatal hernia    Hyperlipidemia    Lateral meniscus tear    Obesity    Truncal   Obstructive sleep apnea    does not use cpap    On home oxygen therapy    uses 2l when is going somewhere per  patient    PUD (peptic ulcer disease)    remote, reports f/u EGD about 8 years ago unremarkable    Reactive airway disease    related to exposure to chemical during 9/11   Sinusitis    Vitamin D deficiency    Past Surgical History:  Past Surgical History:  Procedure Laterality Date   A/V FISTULAGRAM N/A 02/28/2023   Procedure: A/V Fistulagram;  Surgeon: Victorino Sparrow, MD;  Location: [facility] INVASIVE CV LAB;  Service: Cardiovascular;  Laterality: N/A;   ASAD LT SHOULDER  12/15/2008   left shoulder   AV FISTULA PLACEMENT Left 08/09/2016   Procedure: BRACHIOCEPHALIC ARTERIOVENOUS (AV) FISTULA CREATION LEFT ARM;  Surgeon: Sherren Kerns, MD;  Location: [facility] OR;  Service: Vascular;  Laterality: Left;   CAPD INSERTION N/A 10/07/2018   Procedure: LAPAROSCOPIC PERITONEAL CATHETER PLACEMENT;  Surgeon: Rodman Pickle, MD;  Location: [facility] ORS;  Service: General;  Laterality: N/A;   CATARACT EXTRACTION W/PHACO Left 03/28/2016   Procedure: CATARACT EXTRACTION PHACO AND INTRAOCULAR LENS PLACEMENT LEFT EYE;  Surgeon: Jethro Bolus, MD;  Location: [facility] ORS;  Service: Ophthalmology;  Laterality: Left;  CDE: 4.77   CATARACT EXTRACTION W/PHACO Right 04/11/2016   Procedure: CATARACT EXTRACTION PHACO AND INTRAOCULAR LENS PLACEMENT RIGHT EYE; CDE:  4.74;  Surgeon: Jethro Bolus, MD;  Location: [facility]  ORS;  Service: Ophthalmology;  Laterality: Right;   COLONOSCOPY  10/15/2008   Fields: Rectal polyp obliterated, not retrieved, hemorrhoids, single ascending colon diverticulum near the CV. Next colonoscopy April 2020   COLONOSCOPY N/A 12/25/2014   SLF: 1. Colorectal polyps (2) removed 2. Small internal hemorrhoids 3. the left colon is severely redundant. hyperplastic polyps   CORONARY ARTERY BYPASS GRAFT N/A 06/05/2022   Procedure: OFF PUMP CORONARY ARTERY BYPASS GRAFTING (CABG) X 2 BYPASSES USING LEFT INTERNAL MAMMARY ARTERY AND RIGHT LEG GREATER SAPHENOUS VEIN HARVESTED ENDOSCOPICALLY;  Surgeon: Corliss Skains,  MD;  Location: [facility] OR;  Service: Open Heart Surgery;  Laterality: N/A;   CORONARY STENT INTERVENTION N/A 07/25/2021   Procedure: CORONARY STENT INTERVENTION;  Surgeon: Tonny Bollman, MD;  Location: [facility] INVASIVE CV LAB;  Service: Cardiovascular;  Laterality: N/A;   CORONARY STENT INTERVENTION N/A 12/26/2021   Procedure: CORONARY STENT INTERVENTION;  Surgeon: Yvonne Kendall, MD;  Location: [facility] INVASIVE CV LAB;  Service: Cardiovascular;  Laterality: N/A;   CORONARY STENT INTERVENTION N/A 01/20/2022   Procedure: CORONARY STENT INTERVENTION;  Surgeon: Tonny Bollman, MD;  Location: [facility] INVASIVE CV LAB;  Service: Cardiovascular;  Laterality: N/A;   DOPPLER ECHOCARDIOGRAPHY     ESOPHAGOGASTRODUODENOSCOPY N/A 12/25/2014   SLF: 1. Anemia most likely due to CRI, gastritis, gastric polyps 2. Moderate non-erosive gastriits and mild duodenitis.  3.TWo large gstric polyps removed.    EYE SURGERY  12/22/2010   tear duct probing-[place]   FOREIGN BODY REMOVAL  03/29/2011   Procedure: REMOVAL FOREIGN BODY EXTREMITY;  Surgeon: Fuller Canada, MD;  Location: [facility] ORS;  Service: Orthopedics;  Laterality: Right;  Removal Foreign Body Right Thumb   IR FLUORO GUIDE CV LINE RIGHT  08/06/2018   IR FLUORO GUIDE CV LINE RIGHT  02/17/2023   IR US GUIDE VASC ACCESS RIGHT  08/06/2018   IR US GUIDE VASC ACCESS RIGHT  02/17/2023   KNEE ARTHROSCOPY  10/16/2007   left   KNEE ARTHROSCOPY WITH LATERAL MENISECTOMY Right 10/14/2015   Procedure: LEFT KNEE ARTHROSCOPY WITH PARTIAL LATERAL MENISECTOMY;  Surgeon: Vickki Hearing, MD;  Location: [facility]S;  Service: Orthopedics;  Laterality: Right;   LEFT HEART CATH AND CORONARY ANGIOGRAPHY N/A 07/25/2021   Procedure: LEFT HEART CATH AND CORONARY ANGIOGRAPHY;  Surgeon: Tonny Bollman, MD;  Location: [facility] INVASIVE CV LAB;  Service: Cardiovascular;  Laterality: N/A;   LEFT HEART CATH AND CORONARY ANGIOGRAPHY N/A 12/26/2021   Procedure: LEFT HEART CATH AND CORONARY ANGIOGRAPHY;  Surgeon: Yvonne Kendall, MD;  Location: [facility] INVASIVE CV LAB;  Service: Cardiovascular;  Laterality: N/A;   LEFT HEART CATH AND CORONARY ANGIOGRAPHY N/A 01/20/2022   Procedure: LEFT HEART CATH AND CORONARY ANGIOGRAPHY;  Surgeon: Tonny Bollman, MD;  Location: [facility] INVASIVE CV LAB;  Service: Cardiovascular;  Laterality: N/A;   LEFT HEART CATHETERIZATION WITH CORONARY ANGIOGRAM N/A 03/28/2013   Procedure: LEFT HEART CATHETERIZATION WITH CORONARY ANGIOGRAM;  Surgeon: Marykay Lex, MD;  Location: [facility] CATH LAB;  Service: Cardiovascular;  Laterality: N/A;   NM MYOVIEW LTD     PENILE PROSTHESIS IMPLANT N/A 08/16/2015   Procedure: PENILE PROTHESIS INFLATABLE, three piece, Excisional biopsy of Penile ulcer, Penile molding;  Surgeon: Jethro Bolus, MD;  Location: [facility] ORS;  Service: Urology;  Laterality: N/A;   PENILE PROSTHESIS IMPLANT N/A 12/24/2017   Procedure: REMOVAL AND  REPLACEMENT  COLOPLAST PENILE PROSTHESIS;  Surgeon: Crista Elliot, MD;  Location: [facility] ORS;  Service: Urology;  Laterality: N/A;   PERIPHERAL VASCULAR BALLOON ANGIOPLASTY  02/28/2023   Procedure: PERIPHERAL VASCULAR BALLOON ANGIOPLASTY;  Surgeon: Victorino Sparrow, MD;  Location: [facility] INVASIVE CV LAB;  Service: Cardiovascular;;   PORT-A-CATH REMOVAL N/A 02/19/2023   Procedure: REMOVAL OF PERITONEAL DIALYSIS CATHETER;  Surgeon: Victorino Sparrow, MD;  Location: [facility] OR;  Service: Vascular;  Laterality: N/A;   QUADRICEPS TENDON REPAIR  07/21/2011   Procedure: REPAIR QUADRICEP TENDON;  Surgeon: Fuller Canada, MD;  Location: [facility] ORS;  Service: Orthopedics;  Laterality: Right;   RIGHT/LEFT HEART CATH AND CORONARY ANGIOGRAPHY N/A 05/30/2022   Procedure: RIGHT/LEFT HEART CATH AND CORONARY ANGIOGRAPHY;  Surgeon: Corky Crafts, MD;  Location: [facility] INVASIVE CV LAB;  Service: Cardiovascular;  Laterality: N/A;   TEE WITHOUT CARDIOVERSION N/A 06/05/2022   Procedure: TRANSESOPHAGEAL ECHOCARDIOGRAM (TEE);  Surgeon: Corliss Skains, MD;  Location: [facility] OR;   Service: Open Heart Surgery;  Laterality: N/A;   TOENAIL EXCISION     removed x2-bilateral   UMBILICAL HERNIA REPAIR  07/17/2005   [place]    Assessment & Plan Clinical Impression: Patient is a 72 y.o. year old male with PMH of CABG (Nov 23), COPD, DM2, GERD, hypotension on midodrine, and ESRD on PD who was admitted to [facility] on 02/15/23 with c/o AMS and shortness of breath over a period of a few days. Workup revealed sepsis 2/2 catheter peritonitis from PD catheter and polymicrobial bacteremia/fungemia. Hospital course was complicated by worsening encephalopathy and hypoxemia requiring transfer to the ICU. He was transitioned to hemodialysis on TRS schedule. He was started on Unasyn and micafungin. He transitioned to [facility] on 8/16 for further care. He was weaned to room air and tolerating cpap at night. Pt started on coreg for NSVT. Pt has required PRN doses of geodon for agitation which appears to have improved. CTH on 8/20 no acute or reversible finding. Pt c/o back pain on 8/23 and imaging was order but pt not able to tolerate so was cancelled. Therapy ongoing and recommendations are for CIR.  Patient transferred to CIR on 03/12/2023 .    Patient currently requires total with basic self-care skills secondary to muscle weakness, decreased cardiorespiratoy endurance, decreased initiation, decreased attention, decreased awareness, decreased problem solving, decreased safety awareness, decreased memory, and delayed processing, and decreased sitting balance and decreased standing balance, decreased balance strategies.  Prior to hospitalization, patient could complete BADL with independent .  Patient will benefit from skilled intervention to increase independence with basic self-care skills prior to discharge home with care partner.  Anticipate patient will require 24 hour supervision and minimal physical assistance and  t be determined. Likely HHOT or OPOT .  OT - End of  Session Endurance Deficit: Yes Endurance Deficit Description: rest breaks within BADL tasks OT Assessment Rehab Potential (ACUTE ONLY): Fair OT Barriers to Discharge: Behavior OT Barriers to Discharge Comments: Agitation OT Patient demonstrates impairments in the following area(s): Balance;Behavior;Cognition;Endurance;Motor;Pain;Perception;Safety OT Basic ADL's Functional Problem(s): Eating;Bathing;Grooming;Dressing;Toileting OT Transfers Functional Problem(s): Toilet;Tub/Shower OT Additional Impairment(s): None OT Plan OT Intensity: Minimum of 1-2 x/day, 45 to 90 minutes OT Frequency: 5 out of 7 days OT Duration/Estimated Length of Stay: 12-14 OT Treatment/Interventions: Balance/vestibular training;Cognitive remediation/compensation;Community reintegration;Discharge planning;Disease mangement/prevention;DME/adaptive equipment instruction;Functional electrical stimulation;Functional mobility training;Neuromuscular re-education;Pain management;Patient/family education;Psychosocial support;Self Care/advanced ADL retraining;Skin care/wound managment;Splinting/orthotics;Therapeutic Activities;Therapeutic Exercise;UE/LE Strength taining/ROM;UE/LE Coordination activities;Visual/perceptual remediation/compensation;Wheelchair propulsion/positioning OT Self Feeding Anticipated Outcome(s): Supervision OT Basic Self-Care Anticipated Outcome(s): Supervision/min A OT Toileting Anticipated Outcome(s): Min A OT Bathroom Transfers Anticipated Outcome(s): Min A OT Recommendation Recommendations for Other Services: Neuropsych consult Patient  destination: Home Follow Up Recommendations: 24 hour supervision/assistance Equipment Recommended: To be determined  OT Evaluation Precautions/Restrictions  Precautions Precautions: Fall Restrictions Weight Bearing Restrictions: No General OT Amount of Missed Time: 15 Minutes Vital Signs Therapy Vitals Temp: 97.8 F (36.6 C) Pulse Rate: (!) 40 Resp: 17 BP:  123/73 Patient Position (if appropriate): Sitting Oxygen Therapy SpO2: (!) 87 % O2 Device: Room Air O2 Flow Rate (L/min): 2 L/min Pain Pain Assessment Pain Scale: 0-10 Pain Score: 0-No pain Home Living/Prior Functioning Home Living Family/patient expects to be discharged to:: Private residence Living Arrangements: Spouse/significant other Available Help at Discharge: Family, Available 24 hours/day Type of Home: House Home Access: Stairs to enter Secretary/administrator of Steps: 1 Entrance Stairs-Rails: Left Home Layout: One level, Laundry or work area in basement [employer] Shower/Tub: Engineer, manufacturing systems: Handicapped height Additional Comments: information taken from chart review  Lives With: Spouse IADL History Current License: Yes Prior Function Level of Independence: Independent with basic ADLs, Independent with homemaking with ambulation Driving: Yes Vision Ability to See in Adequate Light: 0 Adequate Patient Visual Report: No change from baseline Perception    Praxis   Cognition Cognition Overall Cognitive Status: Impaired/Different from baseline Arousal/Alertness: Awake/alert Orientation Level: Person;Situation;Place Person: Oriented Place: Oriented Situation: Oriented Memory: Impaired Awareness: Impaired Problem Solving: Impaired Behaviors: Restless;Impulsive;Verbal agitation;Physical agitation;Perseveration;Poor frustration tolerance Safety/Judgment: Impaired Brief Interview for Mental Status (BIMS) Repetition of Three Words (First Attempt): 3 Temporal Orientation: Year: Missed by more than 5 years Temporal Orientation: Month: Missed by more than 1 month Temporal Orientation: Day: Incorrect Recall: "Sock": No, could not recall Recall: "Blue": Yes, no cue required Recall: "Bed": No, could not recall BIMS Summary Score: 5 Sensation Sensation Light Touch: Appears Intact Coordination Gross Motor Movements are Fluid and Coordinated:  No Coordination and Movement Description: difficult to assess due to agitation Motor  Motor Motor: Within Functional Limits Motor - Skilled Clinical Observations: generalized weakness  Balance Balance Balance Assessed: Yes Static Sitting Balance Static Sitting - Balance Support: Feet supported Static Sitting - Level of Assistance: 5: Stand by assistance Dynamic Sitting Balance Dynamic Sitting - Balance Support: Feet supported Dynamic Sitting - Level of Assistance: 4: Min assist Static Standing Balance Static Standing - Balance Support: During functional activity Static Standing - Level of Assistance: 4: Min assist Dynamic Standing Balance Dynamic Standing - Balance Support: During functional activity Dynamic Standing - Level of Assistance: 3: Mod assist;4: Min assist Extremity/Trunk Assessment RUE Assessment General Strength Comments: difficult to assess due to agitation LUE Assessment General Strength Comments: difficult to assess due to agitation  Care Tool Care Tool Self Care Eating   Eating Assist Level: Minimal Assistance - Patient > 75%    Oral Care  Oral care, brush teeth, clean dentures activity did not occur: Refused      Bathing   Body parts bathed by patient: Right arm;Left arm;Chest;Abdomen;Front perineal area;Right upper leg;Buttocks;Left upper leg;Right lower leg;Left lower leg;Face     Assist Level: Total Assistance - Patient < 25% (due to pt refusing to assist)    Upper Body Dressing(including orthotics)       Assist Level: Total Assistance - Patient < 25%    Lower Body Dressing (excluding footwear)   What is the patient wearing?: Pants Assist for lower body dressing: Total Assistance - Patient < 25%    Putting on/Taking off footwear     Assist for footwear: Dependent - Patient 0%       Care Tool Toileting Toileting activity   Assist  for toileting: 2 Helpers     Care Tool Bed Mobility Roll left and right activity        Sit to lying  activity        Lying to sitting on side of bed activity   Lying to sitting on side of bed assist level: the ability to move from lying on the back to sitting on the side of the bed with no back support.: Maximal Assistance - Patient 25 - 49%     Care Tool Transfers Sit to stand transfer   Sit to stand assist level: Moderate Assistance - Patient 50 - 74%    Chair/bed transfer   Chair/bed transfer assist level: Moderate Assistance - Patient 50 - 74%     Toilet transfer   Assist Level: Moderate Assistance - Patient 50 - 74%     Care Tool Cognition  Expression of Ideas and Wants Expression of Ideas and Wants: 3. Some difficulty - exhibits some difficulty with expressing needs and ideas (e.g, some words or finishing thoughts) or speech is not clear  Understanding Verbal and Non-Verbal Content Understanding Verbal and Non-Verbal Content: 3. Usually understands - understands most conversations, but misses some part/intent of message. Requires cues at times to understand   Memory/Recall Ability Memory/Recall Ability : That he or she is in a hospital/hospital unit   Refer to Care Plan for Long Term Goals  SHORT TERM GOAL WEEK 1 OT Short Term Goal 1 (Week 1): Pt will will complete 1 step of UB dressing task OT Short Term Goal 2 (Week 1): Patient will stand with min A in preparation for BADL task OT Short Term Goal 3 (Week 1): Pt will wash two body parts with min cues  Recommendations for other services: Neuropsych   Skilled Therapeutic Intervention Pt greeted semi-reclined in bed. Awake, agitated and yelling at staff. OT introduced self and educated on OT role and goals of care. OT tried to engage pt in BADL tasks, but he would yell and say "what don't you understand, I told you I can't do anything." Pt required total A for BADL tasks as he refused to attempt to try any BADL tasks. Pt completed bed mobility with max encouragement and mod A. Pt with fairly good sitting balance, but then  threw himself back on the bed due to behavior not to actual sitting balance. Pt yelling at therapist to get to the chair so pt stood w/ RW with mod A and pivoted to wc with mod A. OT pulled pants up in standing. Pt kept requesting orange juice and that was the only thing that seemed to calm him down. Pt perseverative on calling his wife, being cold, and wanting something to drink. Wife called twice. Pt then wanted to return to bed and completed stand-pivot with mod A. Max A to lift Les back in bed. Pt extremely agitated throughout entire session, yelling at therapist, even yelling for help when therapist was actively helping him move. Pt yelling at therapist to call wife again, so OT called pt's wife and left him with the phone to speak with her. Pt seemingly content at the time therapist left. Bed alarm on, call bell in reach.   ADL ADL Eating: Minimal assistance Grooming: Moderate assistance Upper Body Bathing: Dependent Lower Body Bathing: Dependent Upper Body Dressing: Dependent Lower Body Dressing: Dependent Toileting: Maximal assistance Toilet Transfer Method: Stand pivot Tub/Shower Transfer: Moderate assistance Mobility  Bed Mobility Bed Mobility: Sit to Supine;Supine to Sit Supine to Sit:  Moderate Assistance - Patient 50-74% Sit to Supine: Maximal Assistance - Patient 25-49% Transfers Sit to Stand: Moderate Assistance - Patient 50-74% Stand to Sit: Moderate Assistance - Patient 50-74%   Discharge Criteria: Patient will be discharged from OT if patient refuses treatment 3 consecutive times without medical reason, if treatment goals not met, if there is a change in medical status, if patient makes no progress towards goals or if patient is discharged from hospital.  The above assessment, treatment plan, treatment alternatives and goals were discussed and mutually agreed upon: by patient  [name] 03/13/2023, 9:34 AM

## 2023-03-13 NOTE — Evaluation (Signed)
Physical Therapy Assessment and Plan  Patient Details  Name: [name] [name] MRN: [id number] Date of Birth: 19-Nov-1950  PT Diagnosis: Abnormality of gait, Cognitive deficits, Coordination disorder, Impaired cognition, and Muscle weakness Rehab Potential: Poor ELOS: 2 weeks   Today's Date: 03/13/2023 PT Individual Time: 1102-1209 PT Individual Time Calculation (min): 67 min    Hospital Problem: Principal Problem:   Debility Active Problems:   Type 2 diabetes mellitus with chronic kidney disease on chronic dialysis, with long-term current use of insulin (HCC)   Acute metabolic encephalopathy   HFrEF (heart failure with reduced ejection fraction) (HCC)   Morbid obesity (HCC)   Encephalopathy   Epistaxis   PAF (paroxysmal atrial fibrillation) (HCC)   Long term (current) use of insulin (HCC)   ESRD on dialysis [facility])   Past Medical History:  Past Medical History:  Diagnosis Date   Anemia    Arthritis    Asthma    Bell palsy    CAD (coronary artery disease)    a. 2014 MV: abnl w/ infap ischemia; b. 03/2013 Cath: aneurysmal bleb in the LAD w/ otw nonobs dzs-->Med Rx.   Chronic back pain    Chronic knee pain    a. 09/2015 s/p R TKA.   Chronic pain    Chronic shoulder pain    Chronic sinusitis    COPD (chronic obstructive pulmonary disease) (HCC)    Diabetes mellitus without complication (HCC)    type II    ESRD on peritoneal dialysis (HCC)    on peritoneal dialysis, [facility]   Essential hypertension    GERD (gastroesophageal reflux disease)    Gout    Gout    Hepatomegaly    noted on noncontrast CT 2015   History of hiatal hernia    Hyperlipidemia    Lateral meniscus tear    Obesity    Truncal   Obstructive sleep apnea    does not use cpap    On home oxygen therapy    uses 2l when is going somewhere per patient    PUD (peptic ulcer disease)    remote, reports f/u EGD about 8 years ago unremarkable    Reactive airway disease    related to exposure to  chemical during 9/11   Sinusitis    Vitamin D deficiency    Past Surgical History:  Past Surgical History:  Procedure Laterality Date   A/V FISTULAGRAM N/A 02/28/2023   Procedure: A/V Fistulagram;  Surgeon: Victorino Sparrow, MD;  Location: [facility] INVASIVE CV LAB;  Service: Cardiovascular;  Laterality: N/A;   ASAD LT SHOULDER  12/15/2008   left shoulder   AV FISTULA PLACEMENT Left 08/09/2016   Procedure: BRACHIOCEPHALIC ARTERIOVENOUS (AV) FISTULA CREATION LEFT ARM;  Surgeon: Sherren Kerns, MD;  Location: [facility] OR;  Service: Vascular;  Laterality: Left;   CAPD INSERTION N/A 10/07/2018   Procedure: LAPAROSCOPIC PERITONEAL CATHETER PLACEMENT;  Surgeon: Rodman Pickle, MD;  Location: [facility] ORS;  Service: General;  Laterality: N/A;   CATARACT EXTRACTION W/PHACO Left 03/28/2016   Procedure: CATARACT EXTRACTION PHACO AND INTRAOCULAR LENS PLACEMENT LEFT EYE;  Surgeon: Jethro Bolus, MD;  Location: [facility] ORS;  Service: Ophthalmology;  Laterality: Left;  CDE: 4.77   CATARACT EXTRACTION W/PHACO Right 04/11/2016   Procedure: CATARACT EXTRACTION PHACO AND INTRAOCULAR LENS PLACEMENT RIGHT EYE; CDE:  4.74;  Surgeon: Jethro Bolus, MD;  Location: [facility] ORS;  Service: Ophthalmology;  Laterality: Right;   COLONOSCOPY  10/15/2008   Fields: Rectal polyp obliterated, not retrieved, hemorrhoids,  single ascending colon diverticulum near the CV. Next colonoscopy April 2020   COLONOSCOPY N/A 12/25/2014   SLF: 1. Colorectal polyps (2) removed 2. Small internal hemorrhoids 3. the left colon is severely redundant. hyperplastic polyps   CORONARY ARTERY BYPASS GRAFT N/A 06/05/2022   Procedure: OFF PUMP CORONARY ARTERY BYPASS GRAFTING (CABG) X 2 BYPASSES USING LEFT INTERNAL MAMMARY ARTERY AND RIGHT LEG GREATER SAPHENOUS VEIN HARVESTED ENDOSCOPICALLY;  Surgeon: Corliss Skains, MD;  Location: [facility] OR;  Service: Open Heart Surgery;  Laterality: N/A;   CORONARY STENT INTERVENTION N/A 07/25/2021   Procedure: CORONARY STENT  INTERVENTION;  Surgeon: Tonny Bollman, MD;  Location: [facility] INVASIVE CV LAB;  Service: Cardiovascular;  Laterality: N/A;   CORONARY STENT INTERVENTION N/A 12/26/2021   Procedure: CORONARY STENT INTERVENTION;  Surgeon: Yvonne Kendall, MD;  Location: [facility] INVASIVE CV LAB;  Service: Cardiovascular;  Laterality: N/A;   CORONARY STENT INTERVENTION N/A 01/20/2022   Procedure: CORONARY STENT INTERVENTION;  Surgeon: Tonny Bollman, MD;  Location: [facility] INVASIVE CV LAB;  Service: Cardiovascular;  Laterality: N/A;   DOPPLER ECHOCARDIOGRAPHY     ESOPHAGOGASTRODUODENOSCOPY N/A 12/25/2014   SLF: 1. Anemia most likely due to CRI, gastritis, gastric polyps 2. Moderate non-erosive gastriits and mild duodenitis.  3.TWo large gstric polyps removed.    EYE SURGERY  12/22/2010   tear duct probing-[place]   FOREIGN BODY REMOVAL  03/29/2011   Procedure: REMOVAL FOREIGN BODY EXTREMITY;  Surgeon: Fuller Canada, MD;  Location: [facility]S;  Service: Orthopedics;  Laterality: Right;  Removal Foreign Body Right Thumb   IR FLUORO GUIDE CV LINE RIGHT  08/06/2018   IR FLUORO GUIDE CV LINE RIGHT  02/17/2023   IR US GUIDE VASC ACCESS RIGHT  08/06/2018   IR US GUIDE VASC ACCESS RIGHT  02/17/2023   KNEE ARTHROSCOPY  10/16/2007   left   KNEE ARTHROSCOPY WITH LATERAL MENISECTOMY Right 10/14/2015   Procedure: LEFT KNEE ARTHROSCOPY WITH PARTIAL LATERAL MENISECTOMY;  Surgeon: Vickki Hearing, MD;  Location: [facility]S;  Service: Orthopedics;  Laterality: Right;   LEFT HEART CATH AND CORONARY ANGIOGRAPHY N/A 07/25/2021   Procedure: LEFT HEART CATH AND CORONARY ANGIOGRAPHY;  Surgeon: Tonny Bollman, MD;  Location: [facility] INVASIVE CV LAB;  Service: Cardiovascular;  Laterality: N/A;   LEFT HEART CATH AND CORONARY ANGIOGRAPHY N/A 12/26/2021   Procedure: LEFT HEART CATH AND CORONARY ANGIOGRAPHY;  Surgeon: Yvonne Kendall, MD;  Location: [facility] INVASIVE CV LAB;  Service: Cardiovascular;  Laterality: N/A;   LEFT HEART CATH AND CORONARY ANGIOGRAPHY N/A  01/20/2022   Procedure: LEFT HEART CATH AND CORONARY ANGIOGRAPHY;  Surgeon: Tonny Bollman, MD;  Location: [facility] INVASIVE CV LAB;  Service: Cardiovascular;  Laterality: N/A;   LEFT HEART CATHETERIZATION WITH CORONARY ANGIOGRAM N/A 03/28/2013   Procedure: LEFT HEART CATHETERIZATION WITH CORONARY ANGIOGRAM;  Surgeon: Marykay Lex, MD;  Location: [facility] CATH LAB;  Service: Cardiovascular;  Laterality: N/A;   NM MYOVIEW LTD     PENILE PROSTHESIS IMPLANT N/A 08/16/2015   Procedure: PENILE PROTHESIS INFLATABLE, three piece, Excisional biopsy of Penile ulcer, Penile molding;  Surgeon: Jethro Bolus, MD;  Location: [facility] ORS;  Service: Urology;  Laterality: N/A;   PENILE PROSTHESIS IMPLANT N/A 12/24/2017   Procedure: REMOVAL AND  REPLACEMENT  COLOPLAST PENILE PROSTHESIS;  Surgeon: Crista Elliot, MD;  Location: [facility] ORS;  Service: Urology;  Laterality: N/A;   PERIPHERAL VASCULAR BALLOON ANGIOPLASTY  02/28/2023   Procedure: PERIPHERAL VASCULAR BALLOON ANGIOPLASTY;  Surgeon: Victorino Sparrow, MD;  Location: [facility] INVASIVE CV LAB;  Service: Cardiovascular;;   PORT-A-CATH REMOVAL N/A 02/19/2023   Procedure: REMOVAL OF PERITONEAL DIALYSIS CATHETER;  Surgeon: Victorino Sparrow, MD;  Location: [facility] OR;  Service: Vascular;  Laterality: N/A;   QUADRICEPS TENDON REPAIR  07/21/2011   Procedure: REPAIR QUADRICEP TENDON;  Surgeon: Fuller Canada, MD;  Location: [facility]S;  Service: Orthopedics;  Laterality: Right;   RIGHT/LEFT HEART CATH AND CORONARY ANGIOGRAPHY N/A 05/30/2022   Procedure: RIGHT/LEFT HEART CATH AND CORONARY ANGIOGRAPHY;  Surgeon: Corky Crafts, MD;  Location: [facility] INVASIVE CV LAB;  Service: Cardiovascular;  Laterality: N/A;   TEE WITHOUT CARDIOVERSION N/A 06/05/2022   Procedure: TRANSESOPHAGEAL ECHOCARDIOGRAM (TEE);  Surgeon: Corliss Skains, MD;  Location: [facility] OR;  Service: Open Heart Surgery;  Laterality: N/A;   TOENAIL EXCISION     removed x2-bilateral   UMBILICAL HERNIA REPAIR  07/17/2005    [place]    Assessment & Plan Clinical Impression: Patient is a 72 y.o. year old male was admitted to acute care [facility] on 02/15/2023 after presenting to [facility] ED with acute metabolic encephalopathy assumed due to recent Covid infection PTA treated with Augmentin, multifocal pneumonia. History of ESRD on peritoneal dialysis and nephrology consulted. PD cath malfunction and temporary HD catheter placed. He was found to have sepsis secondary to peritoneal dialysis associated catheter peritonitis and polymicrobial bacteremia/fungemia. Broad spectrum antibiotics started. Hospital course complicated by worsening encephalopathy with hypoxemia requiring transfer to the ICU. He was found to have sepsis secondary to peritoneal dialysis associated catheter peritonitis and polymicrobial bacteremia/fungemia. Hospital course complicated by worsening encephalopathy with hypoxemia requiring transfer to the ICU. Blood cultures were positive for Candida glabrata and staph species and on 8/5>> PD catheter removed in OR. He also has a fistula that has been present in the left upper arm for many years but was unable to be used and is pulsatile on exam. Underwent fistulogram with balloon venoplasty on 8/14 by Dr. Karin Lieu. Fistula was used for HD treatment on 8/16. Discharged to select [facility] on 8/16. He required Geodon prn for agitation. Noted to have 16 beat run of wide complex tachycardia 8/18. IV Unasyn completed on 8/21 for multifocal pneumonia. He has remained delirious with anxiety and continued clonazepam. Cardiology consultation obtained on 8/24, Dr. Algie Coffer. Hypomagnesemia corrected. Recommended to continue carvedilol 6.25 mg BID; consider increasing to 12.5 mg BID before starting low dose amiodarone. Consider Eliquis and discontinue asa and Plavix in one week if no active GI bleeding and stable hemoglobin. He developed back pain and MRI ordered; however, he was not cooperative for exam 8/25. Somnolent on exam.  Noted orders in EPIC for UA, MRI brain and lumbar spine and not aware of whether these will be done prior to CIR admission. Epistaxis resolved with Flonase and saline nose spray.  SLP earlier in admission with SLUMs 7/30. Tolerating carb modified diet. HD scheduled on T/TS. The patient requires inpatient medicine and rehabilitation evaluations and services for ongoing dysfunction secondary to encephalopathy.   Patient currently requires total with mobility secondary to muscle weakness, decreased cardiorespiratoy endurance, impaired timing and sequencing and decreased motor planning,  ,  , decreased initiation, decreased attention, decreased awareness, decreased problem solving, decreased safety awareness, and agitation, and decreased sitting balance, decreased standing balance, decreased postural control, and decreased balance strategies.  Prior to hospitalization, patient was independent  with mobility and lived with Spouse in a House home.  Home access is 1Stairs to enter.  Patient will benefit from skilled PT intervention to maximize safe functional mobility,  minimize fall risk, and decrease caregiver burden for planned discharge home with 24 hour assist.  Anticipate patient will benefit from follow up [facility] at discharge.  PT - End of Session Activity Tolerance: Tolerates 10 - 20 min activity with multiple rests Endurance Deficit: Yes Endurance Deficit Description: requires rest breaks with all tasks PT Assessment Rehab Potential (ACUTE/IP ONLY): Poor PT Barriers to Discharge: Decreased caregiver support;Inaccessible home environment;Behavior PT Barriers to Discharge Comments: 1 STE, needs 2 person assist for all mobility due to behavior at this time PT Patient demonstrates impairments in the following area(s): Behavior;Balance;Endurance;Motor;Pain;Perception;Safety PT Transfers Functional Problem(s): Bed Mobility;Bed to Chair;Car PT Locomotion Functional Problem(s): Ambulation;Stairs;Wheelchair  Mobility PT Plan PT Intensity: Minimum of 1-2 x/day ,45 to 90 minutes PT Frequency: Total of 15 hours over 7 days of combined therapies PT Duration Estimated Length of Stay: 2 weeks PT Treatment/Interventions: Ambulation/gait training;Community reintegration;DME/adaptive equipment instruction;Psychosocial support;Stair training;Neuromuscular re-education;Wheelchair propulsion/positioning;UE/LE Strength taining/ROM;Balance/vestibular training;Discharge planning;Pain management;Therapeutic Activities;UE/LE Coordination activities;Cognitive remediation/compensation;Functional mobility training;Disease management/prevention;Patient/family education;Therapeutic Exercise;Visual/perceptual remediation/compensation PT Transfers Anticipated Outcome(s): Min assist with LRAD PT Locomotion Anticipated Outcome(s): min assist with LRAD ambulatory PT Recommendation Recommendations for Other Services: Neuropsych consult Follow Up Recommendations: Home health PT Patient destination: Home Equipment Recommended: Rolling walker with 5" wheels   PT Evaluation Precautions/Restrictions Precautions Precautions: Fall Restrictions Weight Bearing Restrictions: No General Chart Reviewed: Yes Pain Interference Pain Interference Pain Effect on Sleep: 8. Unable to answer Pain Interference with Therapy Activities: 8. Unable to answer Pain Interference with Day-to-Day Activities: 8. Unable to answer Home Living/Prior Functioning Home Living Available Help at Discharge: Family;Available 24 hours/day Type of Home: House Home Access: Stairs to enter [employer] of Steps: 1 Entrance Stairs-Rails: Left Home Layout: One level;Laundry or work area in basement [employer] Shower/Tub: Engineer, manufacturing systems: Handicapped height Additional Comments: information taken from chart review  Lives With: Spouse Prior Function Level of Independence: Independent with basic ADLs;Independent with homemaking with  ambulation (taken from chart review) Driving: Yes (information per med chart) Cognition Overall Cognitive Status: Impaired/Different from baseline Arousal/Alertness: Awake/alert Orientation Level: Oriented to person;Oriented to place;Disoriented to time;Disoriented to situation Year: Other (Comment) (difficult to assess due to agitation) Month:  (difficult to assess due to agitation) Day of Week: Other (Comment) (difficult to assess due to agitation) Sustained Attention: Impaired Selective Attention: Impaired Memory: Impaired Awareness: Impaired Problem Solving: Impaired Reasoning: Impaired Decision Making: Impaired Self Monitoring: Impaired Safety/Judgment: Impaired Sensation Sensation Light Touch: Not tested (difficult to assess due to agitation) Coordination Gross Motor Movements are Fluid and Coordinated: No Fine Motor Movements are Fluid and Coordinated: No Coordination and Movement Description: difficult to assess due to agitation Motor  Motor Motor: Within Functional Limits Motor - Skilled Clinical Observations: generalized weakness  Trunk/Postural Assessment  Cervical Assessment Cervical Assessment: Within Functional Limits Thoracic Assessment Thoracic Assessment: Within Functional Limits Lumbar Assessment Lumbar Assessment: Within Functional Limits Postural Control Postural Control: Deficits on evaluation Righting Reactions: delayed and inadequate Protective Responses: delayed Postural Limitations: decreased  Balance Balance Balance Assessed: Yes Static Sitting Balance Static Sitting - Balance Support: Feet supported Static Sitting - Level of Assistance: 4: Min assist Dynamic Sitting Balance Dynamic Sitting - Balance Support: Feet supported Dynamic Sitting - Level of Assistance: 4: Min assist Static Standing Balance Static Standing - Balance Support: During functional activity Static Standing - Level of Assistance: 4: Min assist Dynamic Standing  Balance Dynamic Standing - Level of Assistance: 3: Mod assist;4: Min assist Extremity Assessment  RLE Assessment RLE Assessment: Exceptions to [facility] General Strength  Comments: grossly 3/5, assessed funcitonally due to difficulty to assess with agitation LLE Assessment LLE Assessment: Exceptions to [facility] General Strength Comments: grossly 3/5, assessed funcitonally due to difficulty to assess with agitation  Care Tool Care Tool Bed Mobility Roll left and right activity   Roll left and right assist level: Moderate Assistance - Patient 50 - 74%    Sit to lying activity   Sit to lying assist level: Maximal Assistance - Patient 25 - 49%    Lying to sitting on side of bed activity   Lying to sitting on side of bed assist level: the ability to move from lying on the back to sitting on the side of the bed with no back support.: Maximal Assistance - Patient 25 - 49%     Care Tool Transfers Sit to stand transfer   Sit to stand assist level: Moderate Assistance - Patient 50 - 74%    Chair/bed transfer   Chair/bed transfer assist level: 2 Helpers (due to behaviors)     Scientist, research (physical sciences) transfer activity did not occur: Safety/medical concerns        Care Tool Locomotion Ambulation   Assist level: 2 helpers Assistive device: Walker-rolling Max distance: 25'  Walk 10 feet activity   Assist level: 2 helpers Assistive device: Walker-rolling   Walk 50 feet with 2 turns activity Walk 50 feet with 2 turns activity did not occur: Safety/medical concerns      Walk 150 feet activity Walk 150 feet activity did not occur: Safety/medical concerns      Walk 10 feet on uneven surfaces activity Walk 10 feet on uneven surfaces activity did not occur: Safety/medical concerns      Stairs Stair activity did not occur: Safety/medical concerns        Walk up/down 1 step activity Walk up/down 1 step or curb (drop down) activity did not occur: Safety/medical concerns       Walk up/down 4 steps activity Walk up/down 4 steps activity did not occur: Safety/medical concerns      Walk up/down 12 steps activity Walk up/down 12 steps activity did not occur: Safety/medical concerns      Pick up small objects from floor Pick up small object from the floor (from standing position) activity did not occur: Safety/medical concerns      Wheelchair Is the patient using a wheelchair?: Yes Type of Wheelchair: Manual   Wheelchair assist level: Dependent - Patient 0%    Wheel 50 feet with 2 turns activity   Assist Level: Dependent - Patient 0%  Wheel 150 feet activity   Assist Level: Dependent - Patient 0%    Refer to Care Plan for Long Term Goals  SHORT TERM GOAL WEEK 1 PT Short Term Goal 1 (Week 1): Pt will complete bed mobility with min assist PT Short Term Goal 2 (Week 1): Pt will complete transfers with mod assist consistently PT Short Term Goal 3 (Week 1): Pt will ambulate with LRAD mod assist 54'  Recommendations for other services: Neuropsych  Skilled Therapeutic Intervention Evaluation completed (see details above and below) with education on PT POC and goals and individual treatment initiated with focus on gait training and command following with pt demonstrating signifcant agitation throughout session requires increased time to complete all mobility due to agitation and verbal aggression. Pt reporting pain in sacrum and pain in stomach however inconsistent throughout session. Therapist provides TIS WC during session to promote safety with  upright sitting and decrease risk of falls, provided with ROHO cushion for decreased risk for skin break down. Pt completes bed mobility with max assist, unwilling to assist with supine to sit but requesting OOB mobility.  Pt completes sit<>stand and stand pivot with mod assist, 2nd person present providing HHA, max verbal cues to turn to sit in [facility] with pt attempting to sit prematurely. Pt completes gait with RW 25' with mod  assist for managing RW, 2nd person WC follow in case of fatigue or sudden sit however pt able to ambulate to mat. Pt provided with verbal cues for increasing proximity to RW due to significant anterior pushing of RW during ambulation. Pt requires frequent redirection and change of environment to facilitate participation. Pt remains in WC, tilted posteriorly for safety, with posey belt donned and activated at end of session.  Mobility Bed Mobility Bed Mobility: Supine to Sit Supine to Sit: Maximal Assistance - Patient - Patient 25-49% Transfers Transfers: Stand to [employer] Pivot Transfers Sit to Stand: Moderate Assistance - Patient 50-74% Stand to Sit: Moderate Assistance - Patient 50-74% Stand Pivot Transfers: 2 Helpers Transfer (Assistive device): 2 person hand held assist Locomotion  Gait Ambulation: Yes Gait Assistance: 2 Teacher, music (Feet): 25 Feet Assistive device: Rolling walker Gait Assistance Details: Verbal cues for safe use of DME/AE;Manual facilitation for placement;Verbal cues for precautions/safety Gait Assistance Details: assist for safe mgmt of RW with pt demonstrating pushing RW forward Gait Gait: Yes Gait Pattern: Impaired Gait Pattern: Shuffle;Poor foot clearance - left;Poor foot clearance - right Gait velocity: decreased Stairs / Additional Locomotion Stairs: No Wheelchair Mobility Wheelchair Mobility: No   Discharge Criteria: Patient will be discharged from PT if patient refuses treatment 3 consecutive times without medical reason, if treatment goals not met, if there is a change in medical status, if patient makes no progress towards goals or if patient is discharged from hospital.  The above assessment, treatment plan, treatment alternatives and goals were discussed and mutually agreed upon: by patient  Edwin Cap PT, DPT 03/13/2023, 2:34 PM

## 2023-03-13 NOTE — Consult Note (Signed)
History and Physical    [name] [name] QMV:[id number] DOB: 1950-10-19 DOA: 03/12/2023  PCP: Benetta Spar, MD  Consult requested by  Elijah Birk  Reason for consult:agitation/ Acute Encephalopathy   I have personally briefly reviewed patient's old medical records in [facility]  Chief Complaint:  agitation /Acute metabolic encephalopathy   HPI: [name] [name] is a 72 y.o. male with medical history significant of  "acute care [facility] on 02/15/2023 after presenting to [facility] ED with acute metabolic encephalopathy assumed due to recent Covid infection PTA treated with Augmentin, multifocal pneumonia. History of ESRD on peritoneal dialysis and nephrology consulted. PD cath malfunction and temporary HD catheter placed. He was found to have sepsis secondary to peritoneal dialysis associated catheter peritonitis and polymicrobial bacteremia/fungemia. Broad spectrum antibiotics started. Hospital course complicated by worsening encephalopathy with hypoxemia requiring transfer to the ICU. He was found to have sepsis secondary to peritoneal dialysis associated catheter peritonitis and polymicrobial bacteremia/fungemia. Hospital course complicated by worsening encephalopathy with hypoxemia requiring transfer to the ICU. Blood cultures were positive for Candida glabrata and staph species and on 8/5>> PD catheter removed in OR. He also has a fistula that has been present in the left upper arm for many years but was unable to be used and is pulsatile on exam. Underwent fistulogram with balloon venoplasty on 8/14 by Dr. Karin Lieu. Fistula was used for HD treatment on 8/16. [facility]charged to [facility] on 8/16. He required Geodon prn for agitation. Noted to have 16 beat run of wide complex tachycardia 8/18. IV Unasyn completed on 8/21 for multifocal pneumonia. He has remained delirious with anxiety and continued clonazepam. Cardiology consultation obtained on 8/24, Dr. Algie Coffer. Hypomagnesemia  corrected. Recommended to continue carvedilol 6.25 mg BID; consider increasing to 12.5 mg BID before starting low dose amiodarone. Consider Eliquis and discontinue asa and Plavix in one week if no active GI bleeding and stable hemoglobin. He developed back pain and MRI ordered; however, he was not cooperative for exam 8/25. Somnolent on exam. " Per admit HP . Currently patient has continued delirium and encephalopathy with episodes of  agitation. Hospitalist consult requested for assistance with further evaluation.  Of note patient while at [facility] has had these behaviors and appears to have difficulty clearing his delirium /encephalopathy.  However on my exam patient is alert and oriented x 4, he states he was upset this am as he was not getting his needs met. He is very clear currently noting he is in pain and not getting relief of his back pain.  Per rn after patient was medicated with Geodon he was more agreeable. Patient notes no chest pain /n/v/d/ but does mention lower back and bottom pain from sitting in one position too long.    Review of Systems: As per HPI otherwise 10 point review of systems negative.   Past Medical History:  Diagnosis Date   Anemia    Arthritis    Asthma    Bell palsy    CAD (coronary artery disease)    a. 2014 MV: abnl w/ infap ischemia; b. 03/2013 Cath: aneurysmal bleb in the LAD w/ otw nonobs dzs-->Med Rx.   Chronic back pain    Chronic knee pain    a. 09/2015 s/p R TKA.   Chronic pain    Chronic shoulder pain    Chronic sinusitis    COPD (chronic obstructive pulmonary disease) (HCC)    Diabetes mellitus without complication (HCC)    type II  ESRD on peritoneal dialysis (HCC)    on peritoneal dialysis, [facility]   Essential hypertension    GERD (gastroesophageal reflux disease)    Gout    Gout    Hepatomegaly    noted on noncontrast CT 2015   History of hiatal hernia    Hyperlipidemia    Lateral meniscus tear    Obesity    Trun[place]    Obstructive sleep apnea    does not use cpap    On home oxygen therapy    uses 2l when is going somewhere per patient    PUD (peptic ulcer disease)    remote, reports f/u EGD about 8 years ago unremarkable    Reactive airway disease    related to exposure to chemical during 9/11   Sinusitis    Vitamin D deficiency     Past Surgical History:  Procedure Laterality Date   A/V FISTULAGRAM N/A 02/28/2023   Procedure: A/V Fistulagram;  Surgeon: Victorino Sparrow, MD;  Location: [facility] INVASIVE CV LAB;  Service: Cardiovascular;  Laterality: N/A;   ASAD LT SHOULDER  12/15/2008   left shoulder   AV FISTULA PLACEMENT Left 08/09/2016   Procedure: BRACHIOCEPHALIC ARTERIOVENOUS (AV) FISTULA CREATION LEFT ARM;  Surgeon: Sherren Kerns, MD;  Location: [facility] OR;  Service: Vascular;  Laterality: Left;   CAPD INSERTION N/A 10/07/2018   Procedure: LAPAROSCOPIC PERITONEAL CATHETER PLACEMENT;  Surgeon: Rodman Pickle, MD;  Location: [facility] ORS;  Service: General;  Laterality: N/A;   CATARACT EXTRACTION W/PHACO Left 03/28/2016   Procedure: CATARACT EXTRACTION PHACO AND INTRAOCULAR LENS PLACEMENT LEFT EYE;  Surgeon: Jethro Bolus, MD;  Location: [facility] ORS;  Service: Ophthalmology;  Laterality: Left;  CDE: 4.77   CATARACT EXTRACTION W/PHACO Right 04/11/2016   Procedure: CATARACT EXTRACTION PHACO AND INTRAOCULAR LENS PLACEMENT RIGHT EYE; CDE:  4.74;  Surgeon: Jethro Bolus, MD;  Location: [facility] ORS;  Service: Ophthalmology;  Laterality: Right;   COLONOSCOPY  10/15/2008   Fields: Rectal polyp obliterated, not retrieved, hemorrhoids, single ascending colon diverticulum near the CV. Next colonoscopy April 2020   COLONOSCOPY N/A 12/25/2014   SLF: 1. Colorectal polyps (2) removed 2. Small internal hemorrhoids 3. the left colon is severely redundant. hyperplastic polyps   CORONARY ARTERY BYPASS GRAFT N/A 06/05/2022   Procedure: OFF PUMP CORONARY ARTERY BYPASS GRAFTING (CABG) X 2 BYPASSES USING LEFT INTERNAL MAMMARY ARTERY AND  RIGHT LEG GREATER SAPHENOUS VEIN HARVESTED ENDOSCOPICALLY;  Surgeon: Corliss Skains, MD;  Location: [facility] OR;  Service: Open Heart Surgery;  Laterality: N/A;   CORONARY STENT INTERVENTION N/A 07/25/2021   Procedure: CORONARY STENT INTERVENTION;  Surgeon: Tonny Bollman, MD;  Location: [facility] INVASIVE CV LAB;  Service: Cardiovascular;  Laterality: N/A;   CORONARY STENT INTERVENTION N/A 12/26/2021   Procedure: CORONARY STENT INTERVENTION;  Surgeon: Yvonne Kendall, MD;  Location: [facility] INVASIVE CV LAB;  Service: Cardiovascular;  Laterality: N/A;   CORONARY STENT INTERVENTION N/A 01/20/2022   Procedure: CORONARY STENT INTERVENTION;  Surgeon: Tonny Bollman, MD;  Location: [facility] INVASIVE CV LAB;  Service: Cardiovascular;  Laterality: N/A;   DOPPLER ECHOCARDIOGRAPHY     ESOPHAGOGASTRODUODENOSCOPY N/A 12/25/2014   SLF: 1. Anemia most likely due to CRI, gastritis, gastric polyps 2. Moderate non-erosive gastriits and mild duodenitis.  3.TWo large gstric polyps removed.    EYE SURGERY  12/22/2010   tear duct probing-[place]   FOREIGN BODY REMOVAL  03/29/2011   Procedure: REMOVAL FOREIGN BODY EXTREMITY;  Surgeon: Fuller Canada, MD;  Location: [facility] ORS;  Service: Orthopedics;  Laterality: Right;  Removal Foreign Body Right Thumb   IR FLUORO GUIDE CV LINE RIGHT  08/06/2018   IR FLUORO GUIDE CV LINE RIGHT  02/17/2023   IR US GUIDE VASC ACCESS RIGHT  08/06/2018   IR US GUIDE VASC ACCESS RIGHT  02/17/2023   KNEE ARTHROSCOPY  10/16/2007   left   KNEE ARTHROSCOPY WITH LATERAL MENISECTOMY Right 10/14/2015   Procedure: LEFT KNEE ARTHROSCOPY WITH PARTIAL LATERAL MENISECTOMY;  Surgeon: Vickki Hearing, MD;  Location: [facility]S;  Service: Orthopedics;  Laterality: Right;   LEFT HEART CATH AND CORONARY ANGIOGRAPHY N/A 07/25/2021   Procedure: LEFT HEART CATH AND CORONARY ANGIOGRAPHY;  Surgeon: Tonny Bollman, MD;  Location: [facility] INVASIVE CV LAB;  Service: Cardiovascular;  Laterality: N/A;   LEFT HEART CATH AND CORONARY  ANGIOGRAPHY N/A 12/26/2021   Procedure: LEFT HEART CATH AND CORONARY ANGIOGRAPHY;  Surgeon: Yvonne Kendall, MD;  Location: [facility] INVASIVE CV LAB;  Service: Cardiovascular;  Laterality: N/A;   LEFT HEART CATH AND CORONARY ANGIOGRAPHY N/A 01/20/2022   Procedure: LEFT HEART CATH AND CORONARY ANGIOGRAPHY;  Surgeon: Tonny Bollman, MD;  Location: [facility] INVASIVE CV LAB;  Service: Cardiovascular;  Laterality: N/A;   LEFT HEART CATHETERIZATION WITH CORONARY ANGIOGRAM N/A 03/28/2013   Procedure: LEFT HEART CATHETERIZATION WITH CORONARY ANGIOGRAM;  Surgeon: Marykay Lex, MD;  Location: [facility] CATH LAB;  Service: Cardiovascular;  Laterality: N/A;   NM MYOVIEW LTD     PENILE PROSTHESIS IMPLANT N/A 08/16/2015   Procedure: PENILE PROTHESIS INFLATABLE, three piece, Excisional biopsy of Penile ulcer, Penile molding;  Surgeon: Jethro Bolus, MD;  Location: [facility] ORS;  Service: Urology;  Laterality: N/A;   PENILE PROSTHESIS IMPLANT N/A 12/24/2017   Procedure: REMOVAL AND  REPLACEMENT  COLOPLAST PENILE PROSTHESIS;  Surgeon: Crista Elliot, MD;  Location: [facility] ORS;  Service: Urology;  Laterality: N/A;   PERIPHERAL VASCULAR BALLOON ANGIOPLASTY  02/28/2023   Procedure: PERIPHERAL VASCULAR BALLOON ANGIOPLASTY;  Surgeon: Victorino Sparrow, MD;  Location: [facility] INVASIVE CV LAB;  Service: Cardiovascular;;   PORT-A-CATH REMOVAL N/A 02/19/2023   Procedure: REMOVAL OF PERITONEAL DIALYSIS CATHETER;  Surgeon: Victorino Sparrow, MD;  Location: [facility] OR;  Service: Vascular;  Laterality: N/A;   QUADRICEPS TENDON REPAIR  07/21/2011   Procedure: REPAIR QUADRICEP TENDON;  Surgeon: Fuller Canada, MD;  Location: [facility]S;  Service: Orthopedics;  Laterality: Right;   RIGHT/LEFT HEART CATH AND CORONARY ANGIOGRAPHY N/A 05/30/2022   Procedure: RIGHT/LEFT HEART CATH AND CORONARY ANGIOGRAPHY;  Surgeon: Corky Crafts, MD;  Location: [facility] INVASIVE CV LAB;  Service: Cardiovascular;  Laterality: N/A;   TEE WITHOUT CARDIOVERSION N/A 06/05/2022    Procedure: TRANSESOPHAGEAL ECHOCARDIOGRAM (TEE);  Surgeon: Corliss Skains, MD;  Location: [facility] OR;  Service: Open Heart Surgery;  Laterality: N/A;   TOENAIL EXCISION     removed x2-bilateral   UMBILICAL HERNIA REPAIR  07/17/2005   [place]     reports that he quit smoking about 12 years ago. His smoking use included cigarettes. He started smoking about 37 years ago. He has a 25 pack-year smoking history. He has never used smokeless tobacco. He reports that he does not currently use alcohol. He reports current drug use. Drug: Marijuana.  Allergies  Allergen Reactions   Opana [Oxymorphone Hcl] Itching   Tramadol Itching    Family History  Problem Relation Age of Onset   Hypertension Mother        MI   Cancer Mother        breast  Diabetes Mother    Diabetes Father    Hypertension Father    Hypertension Sister    Diabetes Sister    Arthritis Other    Asthma Other    Lung disease Other    Anesthesia problems Neg Hx    Hypotension Neg Hx    Malignant hyperthermia Neg Hx    Pseudochol deficiency Neg Hx    Colon cancer Neg Hx     Prior to Admission medications   Medication Sig Start Date End Date Taking? Authorizing Provider  albuterol (VENTOLIN HFA) 108 (90 Base) MCG/ACT inhaler Inhale 2 puffs into the lungs every 6 (six) hours as needed for wheezing or shortness of breath. 03/25/21   Oretha Milch, MD  aspirin EC 81 MG tablet Take 81 mg by mouth daily. Swallow whole.    [provider]  atorvastatin (LIPITOR) 80 MG tablet Take 1 tablet (80 mg total) by mouth daily. 06/20/22   Ardelle Balls, PA-C  clopidogrel (PLAVIX) 75 MG tablet Take 1 tablet (75 mg total) by mouth daily. Please take first dose at 9 pm on 12/4 06/19/22   Ardelle Balls, PA-C  feeding supplement (ENSURE ENLIVE / ENSURE PLUS) LIQD Take 237 mLs by mouth 2 (two) times daily between meals. 03/02/23   Ghimire, Werner Lean, MD  fluticasone (FLONASE) 50 MCG/ACT nasal spray Place 1 spray into both  nostrils as needed for allergies or rhinitis. Patient not taking: Reported on 02/16/2023 10/11/22   [provider]  insulin aspart (NOVOLOG) 100 UNIT/ML injection 0-6 Units, Subcutaneous, 3 times daily with meals, CBG < 70: Implement Hypoglycemia measures CBG 70 - 120: 0 units CBG 121 - 150: 0 units CBG 151 - 200: 1 unit CBG 201-250: 2 units CBG 251-300: 3 units CBG 301-350: 4 units CBG 351-400: 5 units CBG > 400: Give 6 units and call MD 03/02/23   Maretta Bees, MD  midodrine (PROAMATINE) 10 MG tablet Take 1 tablet (10 mg total) by mouth 3 (three) times daily with meals. 03/02/23   Ghimire, Werner Lean, MD  midodrine (PROAMATINE) 10 MG tablet Take 1 tablet (10 mg total) by mouth every dialysis (give 30- 60 min prior to each HD session). 03/02/23   Ghimire, Werner Lean, MD  multivitamin (RENA-VIT) TABS tablet Take 1 tablet by mouth at bedtime. 03/02/23   Ghimire, Werner Lean, MD  nitroGLYCERIN (NITROSTAT) 0.4 MG SL tablet Place 1 tablet (0.4 mg total) under the tongue every 5 (five) minutes as needed for chest pain. 03/02/23   Ghimire, Werner Lean, MD  Nystatin (GERHARDT'S BUTT CREAM) CREA Apply 1 Application topically 4 (four) times daily. 03/02/23   Ghimire, Werner Lean, MD  pantoprazole (PROTONIX) 40 MG tablet Take 1 tablet (40 mg total) by mouth 2 (two) times daily. 03/02/23   Ghimire, Werner Lean, MD  SYMBICORT 160-4.5 MCG/ACT inhaler INHALE 2 PUFFS INTO THE LUNGS TWICE DAILY. 01/08/23   Leslye Peer, MD    Physical Exam: Vitals:   03/13/23 0909 03/13/23 1342 03/13/23 1349 03/13/23 1447  BP: 123/73 103/66  111/70  Pulse: (!) 40 77  76  Resp: 17 (!) 25  20  Temp: 97.8 F (36.6 C) 97.7 F (36.5 C)  98.4 F (36.9 C)  TempSrc:  Oral    SpO2: (!) 87% 91%  97%  Weight:   104.3 kg   Height:        Constitutional: NAD, calm, comfortable Vitals:   03/13/23 0909 03/13/23 1342 03/13/23 1349 03/13/23 1447  BP: 123/73 103/66  111/70  Pulse: (!) 40 77  76  Resp: 17 (!) 25  20  Temp: 97.8 F  (36.6 C) 97.7 F (36.5 C)  98.4 F (36.9 C)  TempSrc:  Oral    SpO2: (!) 87% 91%  97%  Weight:   104.3 kg   Height:       Eyes: PERRL, lids and conjunctivae normal ENMT: Mucous membranes are moist. Posterior pharynx clear of any exudate or lesions.Normal dentition.  Neck: normal, supple, no masses, no thyromegaly Respiratory: clear to auscultation bilaterally, no wheezing, no crackles. Normal respiratory effort. No accessory muscle use.  Cardiovascular: Regular rate and rhythm, no murmurs / rubs / gallops. No extremity edema. 2+ pedal pulses.  Abdomen: no tenderness, no masses palpated. No hepatosplenomegaly. Bowel sounds positive.  Musculoskeletal: no clubbing / cyanosis. No joint deformity upper and lower extremities. Good ROM, no contractures.  Skin: no rashes, lesions, ulcers. No induration Neurologic: CN 2-12 grossly intact. Sensation intact, 4/5 upper,  3+in lower extremities  Psychiatric: Normal judgment and insight. Alert and oriented x 3. Normal mood currently    Labs on Admission: I have personally reviewed following labs and imaging studies  CBC: Recent Labs  Lab 03/08/23 0034 03/09/23 0951 03/10/23 0845 03/13/23 0816  WBC 4.1 4.4 8.2 4.3  NEUTROABS 2.8 3.1 6.7  --   HGB 7.8* 8.1* 8.5* 8.1*  HCT 25.2* 26.4* 27.2* 27.5*  MCV 92.0 93.3 90.7 93.9  PLT 156 165 189 188   Basic Metabolic Panel: Recent Labs  Lab 03/08/23 0034 03/09/23 0951 03/10/23 0845 03/13/23 0816  NA 134* 132* 134* 137  K 3.7 3.8 4.7 4.8  CL 97* 96* 99 99  CO2 23 24 24 25   GLUCOSE 155* 128* 152* 145*  BUN 34* 40* 51* 48*  CREATININE 7.98* 9.24* 11.16* 11.38*  CALCIUM 8.5* 8.4* 9.1 8.6*  MG  --  1.8 1.8  --   PHOS 4.9* 5.1* 6.6* 6.4*   GFR: Estimated Creatinine Clearance: 6.4 mL/min (A) (by C-G formula based on SCr of 11.38 mg/dL (H)). Liver Function Tests: Recent Labs  Lab 03/08/23 0034 03/10/23 0845 03/13/23 0816  ALBUMIN <1.5* 1.7* 1.6*   No results for input(s): "LIPASE",  "AMYLASE" in the last 168 hours. Recent Labs  Lab 03/13/23 1540  AMMONIA 29   Coagulation Profile: Recent Labs  Lab 03/08/23 0034  INR 1.0   Cardiac Enzymes: No results for input(s): "CKTOTAL", "CKMB", "CKMBINDEX", "TROPONINI" in the last 168 hours. BNP (last 3 results) No results for input(s): "PROBNP" in the last 8760 hours. HbA1C: No results for input(s): "HGBA1C" in the last 72 hours. CBG: Recent Labs  Lab 03/12/23 1722 03/12/23 2046 03/13/23 0629 03/13/23 1123 03/13/23 1641  GLUCAP 137* 138* 112* 131* 121*   Lipid Profile: No results for input(s): "CHOL", "HDL", "LDLCALC", "TRIG", "CHOLHDL", "LDLDIRECT" in the last 72 hours. Thyroid Function Tests: No results for input(s): "TSH", "T4TOTAL", "FREET4", "T3FREE", "THYROIDAB" in the last 72 hours. Anemia Panel: No results for input(s): "VITAMINB12", "FOLATE", "FERRITIN", "TIBC", "IRON", "RETICCTPCT" in the last 72 hours. Urine analysis:    Component Value Date/Time   COLORURINE YELLOW 01/15/2014 2320   APPEARANCEUR Clear 08/21/2022 1440   LABSPEC 1.010 01/15/2014 2320   PHURINE 5.5 01/15/2014 2320   GLUCOSEU Negative 08/21/2022 1440   HGBUR TRACE (A) 01/15/2014 2320   BILIRUBINUR Negative 08/21/2022 1440   KETONESUR NEGATIVE 01/15/2014 2320   PROTEINUR 3+ (A) 08/21/2022 1440   PROTEINUR 30 (A) 01/15/2014 2320  UROBILINOGEN 0.2 01/15/2014 2320   NITRITE Negative 08/21/2022 1440   NITRITE NEGATIVE 01/15/2014 2320   LEUKOCYTESUR 1+ (A) 08/21/2022 1440    Radiological Exams on Admission: No results found.  EKG: Independently reviewed.  Assessment/Plan Persistent Delirium/ Metabolic Encephalopathy  - agree with MRI -will also order tsh, ammonia, vbg  -however with the insult that patient suffered during recent hospitalization and being a renal patient , he may be slow to clear and possibly may have new baseline.  In any event will f/u with Mri results r/o brain anoxic injury vs possible subacute CVA in  setting of sepsis and episode of hypotension. -at this time agree with supporting patient with current medications for delirium. Of course hope would be able to wean as able -patient currently on my interview is calm and oriented  and is attempting to get his needs met .  However previous staff experience was much different -It appears that patient has difficulty getting his needs met and due to this became agitated.  - consider neurology consult if  MRI abnormal  -also would recommend psych consult to assist with medications for agitation if current regimen is not deemed adequate.  Back pain -supportive care  -will use heat packs  - low dose opioid  -lidocaine patch -further supportive care and therapy to be addressed by Rehab Staff -further evaluation per rehab staff  Hx of Hypotension HLD CAD s/p CABG DMII ESRD TTS  COPD/OSA GERD NSVT/Afib Hfref  Liver nodularity  Epistaxis  Above conditions reviewed and agree with current management     [name] MD [facility]   If 7PM-7AM, please contact night-coverage [email or web address] Password [facility]  03/13/2023, 5:42 PM

## 2023-03-13 NOTE — Plan of Care (Signed)
  Problem: RH Bed Mobility Goal: LTG Patient will perform bed mobility with assist (PT) Description: LTG: Patient will perform bed mobility with assistance, with/without cues (PT). Flowsheets (Taken 03/13/2023 1625) LTG: Pt will perform bed mobility with assistance level of: Supervision/Verbal cueing   Problem: RH Bed to Chair Transfers Goal: LTG Patient will perform bed/chair transfers w/assist (PT) Description: LTG: Patient will perform bed to chair transfers with assistance (PT). Flowsheets (Taken 03/13/2023 1625) LTG: Pt will perform Bed to Chair Transfers with assistance level: Minimal Assistance - Patient > 75%   Problem: RH Car Transfers Goal: LTG Patient will perform car transfers with assist (PT) Description: LTG: Patient will perform car transfers with assistance (PT). Flowsheets (Taken 03/13/2023 1625) LTG: Pt will perform car transfers with assist:: Minimal Assistance - Patient > 75%   Problem: RH Ambulation Goal: LTG Patient will ambulate in home environment (PT) Description: LTG: Patient will ambulate in home environment, # of feet with assistance (PT). Flowsheets (Taken 03/13/2023 1625) LTG: Pt will ambulate in home environ  assist needed:: Minimal Assistance - Patient > 75% LTG: Ambulation distance in home environment: 23'   Problem: RH Wheelchair Mobility Goal: LTG Patient will propel w/c in controlled environment (PT) Description: LTG: Patient will propel wheelchair in controlled environment, # of feet with assist (PT) Flowsheets (Taken 03/13/2023 1625) LTG: Pt will propel w/c in controlled environ  assist needed:: Supervision/Verbal cueing LTG: Propel w/c distance in controlled environment: 50'   Problem: RH Stairs Goal: LTG Patient will ambulate up and down stairs w/assist (PT) Description: LTG: Patient will ambulate up and down # of stairs with assistance (PT) Flowsheets (Taken 03/13/2023 1625) LTG: Pt will ambulate up/down stairs assist needed:: Minimal Assistance  - Patient > 75% LTG: Pt will  ambulate up and down number of stairs: 1

## 2023-03-13 NOTE — Progress Notes (Signed)
Ok to change Retacrit to Aranesp due to formulary reason per Dr Malen Gauze.  Ulyses Southward, PharmD, BCIDP, AAHIVP, CPP Infectious Disease Pharmacist 03/13/2023 7:29 AM

## 2023-03-13 NOTE — Progress Notes (Signed)
Received from labs lactic acid value of 2.1. Pam Love (PA) on call notified. Result communicated to [facility] (DO). New order to repeat Lactic acid plasma scheduled at am labs. Pt refused catheterization for Urinalysis.

## 2023-03-13 NOTE — Progress Notes (Signed)
Pt yelling/cursing at staff despite being told on several occasions to calm down. He is very upset at this time. Yelling about wrist restraint. NOT SAFE TO DIALYZE AT THIS TIME.  Will send back to his room. Unclear why he is so confused/angry today - ?needs imaging or workup.  If calms, can bring him back tonight or tomorrow morning for dialysis.  HD manager and attending nephrologist aware.  Ozzie Hoyle, PA-C [employer] Pager [phone]

## 2023-03-13 NOTE — Consult Note (Addendum)
[facility] Renal Consultation Note    Indication for Consultation:  Management of ESRD/hemodialysis, anemia, hypertension/volume, and secondary hyperparathyroidism. PCP:  HPI: [name] [name] is a 72 y.o. male with ESRD, T2DM, CAD, HL who is being admitted to CIR for ongoing therapies.  S/p extended [facility] admit 7/31-8/16/24 with AMS, sepsis with fungemia felt due to PD catheter/peritonitis. PD cath removed and he was transitioned to HD (had old AVF). He was transferred to [facility] on 8/16. Now being transferred to CIR. The details of his [facility] admit are per H&P - issues with NSVT - cardiology started amiodarone and electrolyte abnormalities treated. His last HD in [facility] was on Saturday 8/24 - due for HD today.  Seen in room - was initial complaining that his gait belt was too tight, I adjusted it. Then c/o foot pain - inspected feet. No CP/dyspnea, no N/V/D. LUE AVF appears to have a good bruit, useable.   Past Medical History:  Diagnosis Date   Anemia    Arthritis    Asthma    Bell palsy    CAD (coronary artery disease)    a. 2014 MV: abnl w/ infap ischemia; b. 03/2013 Cath: aneurysmal bleb in the LAD w/ otw nonobs dzs-->Med Rx.   Chronic back pain    Chronic knee pain    a. 09/2015 s/p R TKA.   Chronic pain    Chronic shoulder pain    Chronic sinusitis    COPD (chronic obstructive pulmonary disease) (HCC)    Diabetes mellitus without complication (HCC)    type II    ESRD on peritoneal dialysis (HCC)    on peritoneal dialysis, [facility]   Essential hypertension    GERD (gastroesophageal reflux disease)    Gout    Gout    Hepatomegaly    noted on noncontrast CT 2015   History of hiatal hernia    Hyperlipidemia    Lateral meniscus tear    Obesity    Truncal   Obstructive sleep apnea    does not use cpap    On home oxygen therapy    uses 2l when is going somewhere per patient    PUD (peptic ulcer disease)    remote, reports f/u EGD about 8 years ago  unremarkable    Reactive airway disease    related to exposure to chemical during 9/11   Sinusitis    Vitamin D deficiency    Past Surgical History:  Procedure Laterality Date   A/V FISTULAGRAM N/A 02/28/2023   Procedure: A/V Fistulagram;  Surgeon: Victorino Sparrow, MD;  Location: [facility] INVASIVE CV LAB;  Service: Cardiovascular;  Laterality: N/A;   ASAD LT SHOULDER  12/15/2008   left shoulder   AV FISTULA PLACEMENT Left 08/09/2016   Procedure: BRACHIOCEPHALIC ARTERIOVENOUS (AV) FISTULA CREATION LEFT ARM;  Surgeon: Sherren Kerns, MD;  Location: [facility] OR;  Service: Vascular;  Laterality: Left;   CAPD INSERTION N/A 10/07/2018   Procedure: LAPAROSCOPIC PERITONEAL CATHETER PLACEMENT;  Surgeon: Rodman Pickle, MD;  Location: [facility] ORS;  Service: General;  Laterality: N/A;   CATARACT EXTRACTION W/PHACO Left 03/28/2016   Procedure: CATARACT EXTRACTION PHACO AND INTRAOCULAR LENS PLACEMENT LEFT EYE;  Surgeon: Jethro Bolus, MD;  Location: [facility] ORS;  Service: Ophthalmology;  Laterality: Left;  CDE: 4.77   CATARACT EXTRACTION W/PHACO Right 04/11/2016   Procedure: CATARACT EXTRACTION PHACO AND INTRAOCULAR LENS PLACEMENT RIGHT EYE; CDE:  4.74;  Surgeon: Jethro Bolus, MD;  Location: [facility] ORS;  Service: Ophthalmology;  Laterality:  Right;   COLONOSCOPY  10/15/2008   Fields: Rectal polyp obliterated, not retrieved, hemorrhoids, single ascending colon diverticulum near the CV. Next colonoscopy April 2020   COLONOSCOPY N/A 12/25/2014   SLF: 1. Colorectal polyps (2) removed 2. Small internal hemorrhoids 3. the left colon is severely redundant. hyperplastic polyps   CORONARY ARTERY BYPASS GRAFT N/A 06/05/2022   Procedure: OFF PUMP CORONARY ARTERY BYPASS GRAFTING (CABG) X 2 BYPASSES USING LEFT INTERNAL MAMMARY ARTERY AND RIGHT LEG GREATER SAPHENOUS VEIN HARVESTED ENDOSCOPICALLY;  Surgeon: Corliss Skains, MD;  Location: [facility] OR;  Service: Open Heart Surgery;  Laterality: N/A;   CORONARY STENT INTERVENTION N/A  07/25/2021   Procedure: CORONARY STENT INTERVENTION;  Surgeon: Tonny Bollman, MD;  Location: [facility] INVASIVE CV LAB;  Service: Cardiovascular;  Laterality: N/A;   CORONARY STENT INTERVENTION N/A 12/26/2021   Procedure: CORONARY STENT INTERVENTION;  Surgeon: Yvonne Kendall, MD;  Location: [facility] INVASIVE CV LAB;  Service: Cardiovascular;  Laterality: N/A;   CORONARY STENT INTERVENTION N/A 01/20/2022   Procedure: CORONARY STENT INTERVENTION;  Surgeon: Tonny Bollman, MD;  Location: [facility] INVASIVE CV LAB;  Service: Cardiovascular;  Laterality: N/A;   DOPPLER ECHOCARDIOGRAPHY     ESOPHAGOGASTRODUODENOSCOPY N/A 12/25/2014   SLF: 1. Anemia most likely due to CRI, gastritis, gastric polyps 2. Moderate non-erosive gastriits and mild duodenitis.  3.TWo large gstric polyps removed.    EYE SURGERY  12/22/2010   tear duct probing-[place]   FOREIGN BODY REMOVAL  03/29/2011   Procedure: REMOVAL FOREIGN BODY EXTREMITY;  Surgeon: Fuller Canada, MD;  Location: [facility] ORS;  Service: Orthopedics;  Laterality: Right;  Removal Foreign Body Right Thumb   IR FLUORO GUIDE CV LINE RIGHT  08/06/2018   IR FLUORO GUIDE CV LINE RIGHT  02/17/2023   IR US GUIDE VASC ACCESS RIGHT  08/06/2018   IR US GUIDE VASC ACCESS RIGHT  02/17/2023   KNEE ARTHROSCOPY  10/16/2007   left   KNEE ARTHROSCOPY WITH LATERAL MENISECTOMY Right 10/14/2015   Procedure: LEFT KNEE ARTHROSCOPY WITH PARTIAL LATERAL MENISECTOMY;  Surgeon: Vickki Hearing, MD;  Location: [facility]S;  Service: Orthopedics;  Laterality: Right;   LEFT HEART CATH AND CORONARY ANGIOGRAPHY N/A 07/25/2021   Procedure: LEFT HEART CATH AND CORONARY ANGIOGRAPHY;  Surgeon: Tonny Bollman, MD;  Location: [facility] INVASIVE CV LAB;  Service: Cardiovascular;  Laterality: N/A;   LEFT HEART CATH AND CORONARY ANGIOGRAPHY N/A 12/26/2021   Procedure: LEFT HEART CATH AND CORONARY ANGIOGRAPHY;  Surgeon: Yvonne Kendall, MD;  Location: [facility] INVASIVE CV LAB;  Service: Cardiovascular;  Laterality: N/A;   LEFT HEART  CATH AND CORONARY ANGIOGRAPHY N/A 01/20/2022   Procedure: LEFT HEART CATH AND CORONARY ANGIOGRAPHY;  Surgeon: Tonny Bollman, MD;  Location: [facility] INVASIVE CV LAB;  Service: Cardiovascular;  Laterality: N/A;   LEFT HEART CATHETERIZATION WITH CORONARY ANGIOGRAM N/A 03/28/2013   Procedure: LEFT HEART CATHETERIZATION WITH CORONARY ANGIOGRAM;  Surgeon: Marykay Lex, MD;  Location: [facility] CATH LAB;  Service: Cardiovascular;  Laterality: N/A;   NM MYOVIEW LTD     PENILE PROSTHESIS IMPLANT N/A 08/16/2015   Procedure: PENILE PROTHESIS INFLATABLE, three piece, Excisional biopsy of Penile ulcer, Penile molding;  Surgeon: Jethro Bolus, MD;  Location: [facility] ORS;  Service: Urology;  Laterality: N/A;   PENILE PROSTHESIS IMPLANT N/A 12/24/2017   Procedure: REMOVAL AND  REPLACEMENT  COLOPLAST PENILE PROSTHESIS;  Surgeon: Crista Elliot, MD;  Location: [facility] ORS;  Service: Urology;  Laterality: N/A;   PERIPHERAL VASCULAR BALLOON ANGIOPLASTY  02/28/2023   Procedure: PERIPHERAL VASCULAR  BALLOON ANGIOPLASTY;  Surgeon: Victorino Sparrow, MD;  Location: [facility] INVASIVE CV LAB;  Service: Cardiovascular;;   PORT-A-CATH REMOVAL N/A 02/19/2023   Procedure: REMOVAL OF PERITONEAL DIALYSIS CATHETER;  Surgeon: Victorino Sparrow, MD;  Location: [facility] OR;  Service: Vascular;  Laterality: N/A;   QUADRICEPS TENDON REPAIR  07/21/2011   Procedure: REPAIR QUADRICEP TENDON;  Surgeon: Fuller Canada, MD;  Location: [facility]S;  Service: Orthopedics;  Laterality: Right;   RIGHT/LEFT HEART CATH AND CORONARY ANGIOGRAPHY N/A 05/30/2022   Procedure: RIGHT/LEFT HEART CATH AND CORONARY ANGIOGRAPHY;  Surgeon: Corky Crafts, MD;  Location: [facility] INVASIVE CV LAB;  Service: Cardiovascular;  Laterality: N/A;   TEE WITHOUT CARDIOVERSION N/A 06/05/2022   Procedure: TRANSESOPHAGEAL ECHOCARDIOGRAM (TEE);  Surgeon: Corliss Skains, MD;  Location: [facility] OR;  Service: Open Heart Surgery;  Laterality: N/A;   TOENAIL EXCISION     removed x2-bilateral   UMBILICAL  HERNIA REPAIR  07/17/2005   [place]   Family History  Problem Relation Age of Onset   Hypertension Mother        MI   Cancer Mother        breast    Diabetes Mother    Diabetes Father    Hypertension Father    Hypertension Sister    Diabetes Sister    Arthritis Other    Asthma Other    Lung disease Other    Anesthesia problems Neg Hx    Hypotension Neg Hx    Malignant hyperthermia Neg Hx    Pseudochol deficiency Neg Hx    Colon cancer Neg Hx    Social History:  reports that he quit smoking about 12 years ago. His smoking use included cigarettes. He started smoking about 37 years ago. He has a 25 pack-year smoking history. He has never used smokeless tobacco. He reports that he does not currently use alcohol. He reports current drug use. Drug: Marijuana.  ROS: As per HPI otherwise negative.  Physical Exam: Vitals:   03/12/23 2029 03/13/23 0624 03/13/23 0637 03/13/23 0909  BP:  109/66  123/73  Pulse:  (!) 59  (!) 40  Resp:  20  17  Temp:  98.7 F (37.1 C)  97.8 F (36.6 C)  TempSrc:      SpO2: 98% (!) 89%  (!) 87%  Weight:   101.5 kg   Height:         General: Well developed, well nourished, in no acute distress. Room air. Head: Normocephalic, atraumatic, sclera non-icteric, mucus membranes are moist. Neck: Supple without lymphadenopathy/masses. JVD not elevated. Lungs: Clear bilaterally to auscultation without wheezes, rales, or rhonchi. Breathing is unlabored. Heart: RRR with normal S1, S2. No murmurs, rubs, or gallops appreciated. Abdomen: Soft, non-tender, non-distended with normoactive bowel sounds.  Musculoskeletal:  Strength and tone appear normal for age. Lower extremities: No edema or ischemic changes, no open wounds. Neuro: Alert and oriented X 3. Moves all extremities spontaneously. Psych:  Responds to questions appropriately with a normal affect. Dialysis Access: LUE AVF + bruit  Allergies  Allergen Reactions   Opana [Oxymorphone Hcl] Itching    Tramadol Itching   Prior to Admission medications   Medication Sig Start Date End Date Taking? Authorizing Provider  albuterol (VENTOLIN HFA) 108 (90 Base) MCG/ACT inhaler Inhale 2 puffs into the lungs every 6 (six) hours as needed for wheezing or shortness of breath. 03/25/21   Oretha Milch, MD  aspirin EC 81 MG tablet Take 81 mg by mouth daily. Swallow whole.  [provider]  atorvastatin (LIPITOR) 80 MG tablet Take 1 tablet (80 mg total) by mouth daily. 06/20/22   Ardelle Balls, PA-C  clopidogrel (PLAVIX) 75 MG tablet Take 1 tablet (75 mg total) by mouth daily. Please take first dose at 9 pm on 12/4 06/19/22   Ardelle Balls, PA-C  feeding supplement (ENSURE ENLIVE / ENSURE PLUS) LIQD Take 237 mLs by mouth 2 (two) times daily between meals. 03/02/23   Ghimire, Werner Lean, MD  fluticasone (FLONASE) 50 MCG/ACT nasal spray Place 1 spray into both nostrils as needed for allergies or rhinitis. Patient not taking: Reported on 02/16/2023 10/11/22   [provider]  insulin aspart (NOVOLOG) 100 UNIT/ML injection 0-6 Units, Subcutaneous, 3 times daily with meals, CBG < 70: Implement Hypoglycemia measures CBG 70 - 120: 0 units CBG 121 - 150: 0 units CBG 151 - 200: 1 unit CBG 201-250: 2 units CBG 251-300: 3 units CBG 301-350: 4 units CBG 351-400: 5 units CBG > 400: Give 6 units and call MD 03/02/23   Maretta Bees, MD  midodrine (PROAMATINE) 10 MG tablet Take 1 tablet (10 mg total) by mouth 3 (three) times daily with meals. 03/02/23   Ghimire, Werner Lean, MD  midodrine (PROAMATINE) 10 MG tablet Take 1 tablet (10 mg total) by mouth every dialysis (give 30- 60 min prior to each HD session). 03/02/23   Ghimire, Werner Lean, MD  multivitamin (RENA-VIT) TABS tablet Take 1 tablet by mouth at bedtime. 03/02/23   Ghimire, Werner Lean, MD  nitroGLYCERIN (NITROSTAT) 0.4 MG SL tablet Place 1 tablet (0.4 mg total) under the tongue every 5 (five) minutes as needed for chest pain. 03/02/23    Ghimire, Werner Lean, MD  Nystatin (GERHARDT'S BUTT CREAM) CREA Apply 1 Application topically 4 (four) times daily. 03/02/23   Ghimire, Werner Lean, MD  pantoprazole (PROTONIX) 40 MG tablet Take 1 tablet (40 mg total) by mouth 2 (two) times daily. 03/02/23   Ghimire, Werner Lean, MD  SYMBICORT 160-4.5 MCG/ACT inhaler INHALE 2 PUFFS INTO THE LUNGS TWICE DAILY. 01/08/23   Leslye Peer, MD   Current Facility-Administered Medications  Medication Dose Route Frequency Provider Last Rate Last Admin   acetaminophen (TYLENOL) tablet 325-650 mg  325-650 mg Oral Q4H PRN Milinda Antis, PA-C   650 mg at 03/13/23 1004   acidophilus (RISAQUAD) capsule 1 capsule  1 capsule Oral Daily Milinda Antis, PA-C   1 capsule at 03/13/23 4696   aspirin EC tablet 81 mg  81 mg Oral Daily Milinda Antis, PA-C   81 mg at 03/13/23 2952   atorvastatin (LIPITOR) tablet 80 mg  80 mg Oral Daily Milinda Antis, PA-C   80 mg at 03/13/23 8413   bisacodyl (DULCOLAX) EC tablet 5 mg  5 mg Oral Daily PRN Milinda Antis, PA-C       carvedilol (COREG) tablet 6.25 mg  6.25 mg Oral BID WC Milinda Antis, PA-C   6.25 mg at 03/13/23 2440   Chlorhexidine Gluconate Cloth 2 % PADS 6 each  6 each Topical Q0600 Estanislado Emms, MD       clonazePAM Scarlette Calico) tablet 0.5 mg  0.5 mg Oral TID PRN Milinda Antis, PA-C   0.5 mg at 03/13/23 1004   clopidogrel (PLAVIX) tablet 75 mg  75 mg Oral Daily Milinda Antis, PA-C   75 mg at 03/13/23 0806   [START ON 03/16/2023] Darbepoetin Alfa (ARANESP) injection 100 mcg  100 mcg  Subcutaneous Q Fri-1800 Pham, Minh Q, RPH-CPP       feeding supplement (ENSURE ENLIVE / ENSURE PLUS) liquid 237 mL  237 mL Oral BID WC Milinda Antis, PA-C   237 mL at 03/13/23 2952   gabapentin (NEURONTIN) capsule 100 mg  100 mg Oral BID Elijah Birk C, DO       Gerhardt's butt cream 1 Application  1 Application Topical QID Milinda Antis, PA-C       guaiFENesin-dextromethorphan (ROBITUSSIN DM) 100-10 MG/5ML syrup 10 mL  10  mL Oral Q6H PRN Milinda Antis, PA-C       heparin injection 5,000 Units  5,000 Units Subcutaneous Q8H Milinda Antis, PA-C   5,000 Units at 03/13/23 8413   insulin aspart (novoLOG) injection 0-6 Units  0-6 Units Subcutaneous TID AC & HS Setzer, Lynnell Jude, PA-C       lidocaine (LIDODERM) 5 % 1 patch  1 patch Transdermal Q2200 Setzer, Lynnell Jude, PA-C       magnesium oxide (MAG-OX) tablet 400 mg  400 mg Oral Daily Milinda Antis, PA-C   400 mg at 03/13/23 2440   midodrine (PROAMATINE) tablet 10 mg  10 mg Oral PRN Milinda Antis, PA-C       mometasone-formoterol (DULERA) 200-5 MCG/ACT inhaler 2 puff  2 puff Inhalation BID Milinda Antis, PA-C   2 puff at 03/12/23 2027   multivitamin (RENA-VIT) tablet 1 tablet  1 tablet Oral QHS Milinda Antis, PA-C   1 tablet at 03/12/23 2046   ondansetron (ZOFRAN) tablet 4 mg  4 mg Oral Q6H PRN Milinda Antis, PA-C       Or   ondansetron [facility]) injection 4 mg  4 mg Intravenous Q6H PRN Setzer, Lynnell Jude, PA-C       pantoprazole (PROTONIX) EC tablet 40 mg  40 mg Oral BID Milinda Antis, PA-C   40 mg at 03/13/23 1027   QUEtiapine (SEROQUEL) tablet 50 mg  50 mg Oral TID Milinda Antis, PA-C   50 mg at 03/13/23 2536   sodium phosphate (FLEET) enema 1 enema  1 enema Rectal Once PRN Milinda Antis, PA-C       ziprasidone (GEODON) injection 20 mg  20 mg Intramuscular Q12H PRN Milinda Antis, PA-C       Labs: Basic Metabolic Panel: Recent Labs  Lab 03/09/23 0951 03/10/23 0845 03/13/23 0816  NA 132* 134* 137  K 3.8 4.7 4.8  CL 96* 99 99  CO2 24 24 25   GLUCOSE 128* 152* 145*  BUN 40* 51* 48*  CREATININE 9.24* 11.16* 11.38*  CALCIUM 8.4* 9.1 8.6*  PHOS 5.1* 6.6* 6.4*   Liver Function Tests: Recent Labs  Lab 03/08/23 0034 03/10/23 0845 03/13/23 0816  ALBUMIN <1.5* 1.7* 1.6*   CBC: Recent Labs  Lab 03/08/23 0034 03/09/23 0951 03/10/23 0845 03/13/23 0816  WBC 4.1 4.4 8.2 4.3  NEUTROABS 2.8 3.1 6.7  --   HGB 7.8* 8.1* 8.5* 8.1*   HCT 25.2* 26.4* 27.2* 27.5*  MCV 92.0 93.3 90.7 93.9  PLT 156 165 189 188   Dialysis Orders:  Previously on [facility] thru [facility] - will need outpatient HD unit established prior to d/c.  Assessment/Plan:  Debility: In CIR - follow.  ESRD:  Transitioned from PD to HD recently -> continue HD on TTS schedule via L AVF - for HD today.  Hypertension/volume: BP stable, 2L UFG today and will work to establish dry weight.  Anemia: Hgb low - looks like scheduled to get Aranesp q Friday - will ^ next dose.  Metabolic bone disease: CorrCa ok, Phos high - does not appear that is on a binder - start sevelamer 1/meals.  Nutrition:  Alb very low, adding supps.  Hx NSVT  CAD  T2DM  Addendum (1335p): Brought up to unit for dialysis, says he doesn't want HD because it hurts his arm. He has a Comptroller now, apparently fell out of bed this morning and has been confused all day (preceded the fall). Hospitalist aware and will be ordering haldol for him. Will use wrist restraints during HD to protect AVF/needles. KS  Ozzie Hoyle, PA-C 03/13/2023, 11:26 AM  [facility]   Seen and examined independently.  Agree with note and exam as documented above by physician extender and as noted here.  Pharmacy reached out to ask me about a medication and notified me that the patient was here.  I reached out to the rehab team.   Patient with ESRD.  Presented with AMS and covid in late July.  Ultimately with fungemia felt due to PD catheter/peritonitis.  He was transitioned to HD and PD catheter was removed.  We were not able to safely dialyze him today despite a wrist restraint and haldol.  I called his wife and spoke with her.  His wife indicates that he has been yelling and cursing and lashing out at dialysis staff at [facility] as well he has been altered since his hospitalization at the end of July.  We have attempted to redirect the patient and have discussed appropriate behavior in the dialysis unit.   It appears that he became verbally belligerent with speech therapy as well on chart review and he refused to participate in therapy.   General adult male in bed, yelling and cursing  HEENT normocephalic atraumatic extraocular movements intact sclera anicteric Neck supple trachea midline Lungs clear to auscultation bilaterally normal work of breathing at rest  Heart S1S2  Abdomen soft nontender nondistended Extremities no pitting edema  Psych he is agitated and yelling, difficult to redirect Left AVF was initially being attempted to be cannulated on my exam   # ESRD - As above, his wife indicates that he was also yelling and cursing at staff at [facility].  We thoughtfully discussed that if this is his new baseline then he is no longer appropriate for dialysis.  Our team will reassess him tomorrow.    # Debility  - Per CIR team  - note also marked hypoalbuminemia   # Agitation - as above, not able to safely dialyze him today  # HTN  - acceptable control    # Anemia of CKD  - on ESA - note dose was increased    # Metabolic bone disease - started on sevelamer with meals    # Hx NSVT  - noted; he is on a beta blocker   # Metabolic bone disease  Estanislado Emms, MD 03/13/2023  2:37 PM

## 2023-03-13 NOTE — Care Management (Signed)
Inpatient Rehabilitation Center Individual Statement of Services  Patient Name:  [name] [name]  Date:  03/13/2023  Welcome to the Inpatient Rehabilitation Center.  Our goal is to provide you with an individualized program based on your diagnosis and situation, designed to meet your specific needs.  With this comprehensive rehabilitation program, you will be expected to participate in at least 3 hours of rehabilitation therapies Monday-Friday, with modified therapy programming on the weekends.  Your rehabilitation program will include the following services:  Physical Therapy (PT), Occupational Therapy (OT), Speech Therapy (ST), 24 hour per day rehabilitation nursing, Therapeutic Recreaction (TR), Psychology, Neuropsychology, Care Coordinator, Rehabilitation Medicine, Nutrition Services, Pharmacy Services, and Other  Weekly team conferences will be held on Tuesdays to discuss your progress.  Your Inpatient Rehabilitation Care Coordinator will talk with you frequently to get your input and to update you on team discussions.  Team conferences with you and your family in attendance may also be held.  Expected length of stay: 2 weeks    Overall anticipated outcome: Supervision  Depending on your progress and recovery, your program may change. Your Inpatient Rehabilitation Care Coordinator will coordinate services and will keep you informed of any changes. Your Inpatient Rehabilitation Care Coordinator's name and contact numbers are listed  below.  The following services may also be recommended but are not provided by the Inpatient Rehabilitation Center:  Driving Evaluations Home Health Rehabiltiation Services Outpatient Rehabilitation Services Vocational Rehabilitation   Arrangements will be made to provide these services after discharge if needed.  Arrangements include referral to agencies that provide these services.  Your insurance has been verified to be:  Medicare A/B  Your primary doctor  is:  Lovett Sox  Pertinent information will be shared with your doctor and your insurance company.  Inpatient Rehabilitation Care Coordinator:  Susie Cassette [phone] or ([phone]  Information discussed with and copy given to patient by: Gretchen Short, 03/13/2023, 3:34 PM

## 2023-03-13 NOTE — Progress Notes (Signed)
   03/13/23 0932  What Happened  Was fall witnessed? No  Was patient injured? No  Patient found on floor  Found by Staff-comment  Stated prior activity to/from bed, chair, or stretcher  Provider Notification  Provider Name/Title Shearon Stalls, MD  Date Provider Notified 03/13/23  Time Provider Notified [phone]  Method of Notification Page  Notification Reason Fall  Provider response En route  Date of Provider Response 03/13/23  Time of Provider Response [phone]  Follow Up  Family notified Yes - comment (RN attempted to call wife)  Time family notified 0857  Additional tests No  Progress note created (see row info) Yes  Adult Fall Risk Assessment  Risk Factor Category (scoring not indicated) High fall risk per protocol (document High fall risk)  Age 72  Fall History: Fall within 6 months prior to admission 5  Elimination; Bowel and/or Urine Incontinence 0  Elimination; Bowel and/or Urine Urgency/Frequency 0  Medications: includes PCA/Opiates, Anti-convulsants, Anti-hypertensives, Diuretics, Hypnotics, Laxatives, Sedatives, and Psychotropics 5  Patient Care Equipment 1  Mobility-Assistance 2  Mobility-Gait 2  Mobility-Sensory Deficit 0  Altered awareness of immediate physical environment 1  Impulsiveness 2  Lack of understanding of one's physical/cognitive limitations 4  Total Score 24  Patient Fall Risk Level High fall risk  Adult Fall Risk Interventions  Required Bundle Interventions *See Row Information* High fall risk - low, moderate, and high requirements implemented  Additional Interventions PT/OT need assessed if change in mobility from baseline  Fall intervention(s) refused/Patient educated regarding refusal Bed alarm  Screening for Fall Injury Risk (To be completed on HIGH fall risk patients) - Assessing Need for Floor Mats  Risk For Fall Injury- Criteria for Floor Mats Confusion/dementia (+NuDESC, CIWA, TBI, etc.)  Will Implement Floor Mats Yes  Neurological  Neuro (WDL) X   Level of Consciousness Alert  Orientation Level Oriented to person;Other (comment) (refusing assessment)  Musculoskeletal  Weight Bearing Restrictions No

## 2023-03-13 NOTE — Evaluation (Signed)
Speech Language Pathology Assessment and Plan  Patient Details  Name: [name] [name] MRN: [id number] Date of Birth: 1951/06/21  SLP Diagnosis: Cognitive Impairments  Rehab Potential: Fair ELOS:  2 weeks  Today's Date: 03/13/2023 SLP Individual Time: 3154-0086 SLP Individual Time Calculation (min): 56 min  Hospital Problem: Principal Problem:   Debility Active Problems:   Type 2 diabetes mellitus with chronic kidney disease on chronic dialysis, with long-term current use of insulin (HCC)   Acute metabolic encephalopathy   HFrEF (heart failure with reduced ejection fraction) (HCC)   Morbid obesity (HCC)   Encephalopathy   Epistaxis   PAF (paroxysmal atrial fibrillation) (HCC)   Long term (current) use of insulin (HCC)   ESRD on dialysis [facility])  Past Medical History:  Past Medical History:  Diagnosis Date   Anemia    Arthritis    Asthma    Bell palsy    CAD (coronary artery disease)    a. 2014 MV: abnl w/ infap ischemia; b. 03/2013 Cath: aneurysmal bleb in the LAD w/ otw nonobs dzs-->Med Rx.   Chronic back pain    Chronic knee pain    a. 09/2015 s/p R TKA.   Chronic pain    Chronic shoulder pain    Chronic sinusitis    COPD (chronic obstructive pulmonary disease) (HCC)    Diabetes mellitus without complication (HCC)    type II    ESRD on peritoneal dialysis (HCC)    on peritoneal dialysis, [facility]   Essential hypertension    GERD (gastroesophageal reflux disease)    Gout    Gout    Hepatomegaly    noted on noncontrast CT 2015   History of hiatal hernia    Hyperlipidemia    Lateral meniscus tear    Obesity    Truncal   Obstructive sleep apnea    does not use cpap    On home oxygen therapy    uses 2l when is going somewhere per patient    PUD (peptic ulcer disease)    remote, reports f/u EGD about 8 years ago unremarkable    Reactive airway disease    related to exposure to chemical during 9/11   Sinusitis    Vitamin D deficiency    Past Surgical  History:  Past Surgical History:  Procedure Laterality Date   A/V FISTULAGRAM N/A 02/28/2023   Procedure: A/V Fistulagram;  Surgeon: Victorino Sparrow, MD;  Location: [facility] INVASIVE CV LAB;  Service: Cardiovascular;  Laterality: N/A;   ASAD LT SHOULDER  12/15/2008   left shoulder   AV FISTULA PLACEMENT Left 08/09/2016   Procedure: BRACHIOCEPHALIC ARTERIOVENOUS (AV) FISTULA CREATION LEFT ARM;  Surgeon: Sherren Kerns, MD;  Location: [facility] OR;  Service: Vascular;  Laterality: Left;   CAPD INSERTION N/A 10/07/2018   Procedure: LAPAROSCOPIC PERITONEAL CATHETER PLACEMENT;  Surgeon: Rodman Pickle, MD;  Location: [facility] ORS;  Service: General;  Laterality: N/A;   CATARACT EXTRACTION W/PHACO Left 03/28/2016   Procedure: CATARACT EXTRACTION PHACO AND INTRAOCULAR LENS PLACEMENT LEFT EYE;  Surgeon: Jethro Bolus, MD;  Location: [facility] ORS;  Service: Ophthalmology;  Laterality: Left;  CDE: 4.77   CATARACT EXTRACTION W/PHACO Right 04/11/2016   Procedure: CATARACT EXTRACTION PHACO AND INTRAOCULAR LENS PLACEMENT RIGHT EYE; CDE:  4.74;  Surgeon: Jethro Bolus, MD;  Location: [facility] ORS;  Service: Ophthalmology;  Laterality: Right;   COLONOSCOPY  10/15/2008   Fields: Rectal polyp obliterated, not retrieved, hemorrhoids, single ascending colon diverticulum near the CV. Next colonoscopy April 2020  COLONOSCOPY N/A 12/25/2014   SLF: 1. Colorectal polyps (2) removed 2. Small internal hemorrhoids 3. the left colon is severely redundant. hyperplastic polyps   CORONARY ARTERY BYPASS GRAFT N/A 06/05/2022   Procedure: OFF PUMP CORONARY ARTERY BYPASS GRAFTING (CABG) X 2 BYPASSES USING LEFT INTERNAL MAMMARY ARTERY AND RIGHT LEG GREATER SAPHENOUS VEIN HARVESTED ENDOSCOPICALLY;  Surgeon: Corliss Skains, MD;  Location: [facility] OR;  Service: Open Heart Surgery;  Laterality: N/A;   CORONARY STENT INTERVENTION N/A 07/25/2021   Procedure: CORONARY STENT INTERVENTION;  Surgeon: Tonny Bollman, MD;  Location: [facility] INVASIVE CV LAB;  Service:  Cardiovascular;  Laterality: N/A;   CORONARY STENT INTERVENTION N/A 12/26/2021   Procedure: CORONARY STENT INTERVENTION;  Surgeon: Yvonne Kendall, MD;  Location: [facility] INVASIVE CV LAB;  Service: Cardiovascular;  Laterality: N/A;   CORONARY STENT INTERVENTION N/A 01/20/2022   Procedure: CORONARY STENT INTERVENTION;  Surgeon: Tonny Bollman, MD;  Location: [facility] INVASIVE CV LAB;  Service: Cardiovascular;  Laterality: N/A;   DOPPLER ECHOCARDIOGRAPHY     ESOPHAGOGASTRODUODENOSCOPY N/A 12/25/2014   SLF: 1. Anemia most likely due to CRI, gastritis, gastric polyps 2. Moderate non-erosive gastriits and mild duodenitis.  3.TWo large gstric polyps removed.    EYE SURGERY  12/22/2010   tear duct probing-[place]   FOREIGN BODY REMOVAL  03/29/2011   Procedure: REMOVAL FOREIGN BODY EXTREMITY;  Surgeon: Fuller Canada, MD;  Location: [facility];  Service: Orthopedics;  Laterality: Right;  Removal Foreign Body Right Thumb   IR FLUORO GUIDE CV LINE RIGHT  08/06/2018   IR FLUORO GUIDE CV LINE RIGHT  02/17/2023   IR US GUIDE VASC ACCESS RIGHT  08/06/2018   IR US GUIDE VASC ACCESS RIGHT  02/17/2023   KNEE ARTHROSCOPY  10/16/2007   left   KNEE ARTHROSCOPY WITH LATERAL MENISECTOMY Right 10/14/2015   Procedure: LEFT KNEE ARTHROSCOPY WITH PARTIAL LATERAL MENISECTOMY;  Surgeon: Vickki Hearing, MD;  Location: [facility];  Service: Orthopedics;  Laterality: Right;   LEFT HEART CATH AND CORONARY ANGIOGRAPHY N/A 07/25/2021   Procedure: LEFT HEART CATH AND CORONARY ANGIOGRAPHY;  Surgeon: Tonny Bollman, MD;  Location: [facility] INVASIVE CV LAB;  Service: Cardiovascular;  Laterality: N/A;   LEFT HEART CATH AND CORONARY ANGIOGRAPHY N/A 12/26/2021   Procedure: LEFT HEART CATH AND CORONARY ANGIOGRAPHY;  Surgeon: Yvonne Kendall, MD;  Location: [facility] INVASIVE CV LAB;  Service: Cardiovascular;  Laterality: N/A;   LEFT HEART CATH AND CORONARY ANGIOGRAPHY N/A 01/20/2022   Procedure: LEFT HEART CATH AND CORONARY ANGIOGRAPHY;  Surgeon: Tonny Bollman,  MD;  Location: [facility] INVASIVE CV LAB;  Service: Cardiovascular;  Laterality: N/A;   LEFT HEART CATHETERIZATION WITH CORONARY ANGIOGRAM N/A 03/28/2013   Procedure: LEFT HEART CATHETERIZATION WITH CORONARY ANGIOGRAM;  Surgeon: Marykay Lex, MD;  Location: [facility] CATH LAB;  Service: Cardiovascular;  Laterality: N/A;   NM MYOVIEW LTD     PENILE PROSTHESIS IMPLANT N/A 08/16/2015   Procedure: PENILE PROTHESIS INFLATABLE, three piece, Excisional biopsy of Penile ulcer, Penile molding;  Surgeon: Jethro Bolus, MD;  Location: [facility] ORS;  Service: Urology;  Laterality: N/A;   PENILE PROSTHESIS IMPLANT N/A 12/24/2017   Procedure: REMOVAL AND  REPLACEMENT  COLOPLAST PENILE PROSTHESIS;  Surgeon: Crista Elliot, MD;  Location: [facility] ORS;  Service: Urology;  Laterality: N/A;   PERIPHERAL VASCULAR BALLOON ANGIOPLASTY  02/28/2023   Procedure: PERIPHERAL VASCULAR BALLOON ANGIOPLASTY;  Surgeon: Victorino Sparrow, MD;  Location: [facility] INVASIVE CV LAB;  Service: Cardiovascular;;   PORT-A-CATH REMOVAL N/A 02/19/2023   Procedure: REMOVAL OF  PERITONEAL DIALYSIS CATHETER;  Surgeon: Victorino Sparrow, MD;  Location: [facility] OR;  Service: Vascular;  Laterality: N/A;   QUADRICEPS TENDON REPAIR  07/21/2011   Procedure: REPAIR QUADRICEP TENDON;  Surgeon: Fuller Canada, MD;  Location: [facility]S;  Service: Orthopedics;  Laterality: Right;   RIGHT/LEFT HEART CATH AND CORONARY ANGIOGRAPHY N/A 05/30/2022   Procedure: RIGHT/LEFT HEART CATH AND CORONARY ANGIOGRAPHY;  Surgeon: Corky Crafts, MD;  Location: [facility] INVASIVE CV LAB;  Service: Cardiovascular;  Laterality: N/A;   TEE WITHOUT CARDIOVERSION N/A 06/05/2022   Procedure: TRANSESOPHAGEAL ECHOCARDIOGRAM (TEE);  Surgeon: Corliss Skains, MD;  Location: [facility] OR;  Service: Open Heart Surgery;  Laterality: N/A;   TOENAIL EXCISION     removed x2-bilateral   UMBILICAL HERNIA REPAIR  07/17/2005   [place]   Assessment / Plan / Recommendation Clinical Impression Pt is a 72 y/o male with PMH  of CABG (Nov 23), COPD, DM2, GERD, hypotension on midodrine, and ESRD on PD who was admitted to [facility] on 02/15/23 with c/o AMS and shortness of breath over a period of a few days. Workup revealed sepsis 2/2 catheter peritonitis from PD catheter and polymicrobial bacteremia/fungemia. Hospital course was complicated by worsening encephalopathy and hypoxemia requiring transfer to the ICU. He was transitioned to hemodialysis on TRS schedule. He was started on Unasyn and micafungin. He transitioned to [facility] on 8/16 for further care. He was weaned to room air and tolerating cpap at night. Pt started on coreg for NSVT. Pt has required PRN doses of geodon for agitation which appears to have improved. CTH on 8/20 no acute or reversible finding. Pt c/o back pain on 8/23 and imaging was order but pt not able to tolerate so was cancelled. Therapy ongoing and recommendations are for CIR.   Patient exhibits significant cognitive and behavioral deficits impacting safety, judgement, reasoning, orientation, awareness, memory, problem solving, and social pragmatics. SLP attempted to administer COGNISTAT and then SLUMS, however both attempts were quickly aborted as patient became verbally belligerent. Patient is disoriented to city, date, day of the week, and situation specifics. During memory task, patient registered 3/5 items but was then unable to recall any objects after 5 minute distracted delay. Per SLUMS administered during acute stay, patient scored 7/30 though participation levels were not discussed in documentation. Patient speaks belligerently to staff and exhibits ineffective use of call bell to alert staff to needs. Receptive/expressive language and speech appear to be [facility] for all tasks assessed. SLP attempted to conduct a bedside swallow evaluation to assess swallowing function, however patient refused to participate.    Skilled Therapeutic Interventions          SLP conducted skilled  evaluation of speech, language, and cognition via various functional tasks. See impressions above for results.    SLP Assessment  Patient will need skilled Speech Lanaguage Pathology Services during CIR admission    Recommendations  Oral Care Recommendations: Oral care BID Recommendations for Other Services: Neuropsych consult Patient destination: Home Follow up Recommendations: Home Health SLP;24 hour supervision/assistance Equipment Recommended: None recommended by SLP    SLP Frequency 3 to 5 out of 7 days   SLP Duration  SLP Intensity  SLP Treatment/Interventions  2 weeks  Minumum of 1-2 x/day, 30 to 90 minutes  Cognitive remediation/compensation;Environmental controls;Cueing hierarchy;Functional tasks;Therapeutic Activities;Internal/external aids;Patient/family education    Pain Pain Assessment Pain Scale: 0-10 Pain Score: 8  Pain Location: Abdomen Pain Intervention(s): MD notified (Comment)  Prior Functioning Cognitive/Linguistic Baseline: Information not available Type  of Home: House  Lives With: Spouse Available Help at Discharge: Family;Available 24 hours/day Education: 4 years of college  SLP Evaluation Cognition Overall Cognitive Status: Impaired/Different from baseline Arousal/Alertness: Awake/alert Orientation Level: Oriented to person Year: Other (Comment) [phone]) Month: November Day of Week: Incorrect Attention: Sustained;Selective Sustained Attention: Impaired Sustained Attention Impairment: Verbal basic Selective Attention: Impaired Selective Attention Impairment: Verbal basic Memory: Impaired Memory Impairment: Storage deficit;Retrieval deficit;Decreased recall of new information Awareness: Impaired Awareness Impairment: Emergent impairment;Anticipatory impairment Problem Solving: Impaired Problem Solving Impairment: Verbal basic;Functional basic Executive Function: Reasoning;Decision Making;Self Monitoring Reasoning: Impaired Reasoning  Impairment: Verbal basic;Functional basic Decision Making: Impaired Decision Making Impairment: Verbal basic;Functional basic Self Monitoring: Impaired Self Monitoring Impairment: Verbal basic;Functional basic Behaviors: Restless;Impulsive;Verbal agitation;Physical agitation;Perseveration;Poor frustration tolerance Safety/Judgment: Impaired  Comprehension Auditory Comprehension Overall Auditory Comprehension: Appears within functional limits for tasks assessed Expression Expression Primary Mode of Expression: Verbal Verbal Expression Overall Verbal Expression: Appears within functional limits for tasks assessed Written Expression Dominant Hand: Right Written Expression: Not tested Oral Motor Oral Motor/Sensory Function Overall Oral Motor/Sensory Function:  (unable to assess 2* verbal agitation and poor participation) Motor Speech Overall Motor Speech: Appears within functional limits for tasks assessed Respiration: Within functional limits Phonation: Normal Resonance: Within functional limits Articulation: Within functional limitis (mildly imprecise articulation however suspect dialectical influence) Intelligibility: Intelligible Motor Planning: Witnin functional limits Motor Speech Errors: Not applicable  Care Tool Care Tool Cognition Ability to hear (with hearing aid or hearing appliances if normally used Ability to hear (with hearing aid or hearing appliances if normally used): 0. Adequate - no difficulty in normal conservation, social interaction, listening to TV   Expression of Ideas and Wants Expression of Ideas and Wants: 3. Some difficulty - exhibits some difficulty with expressing needs and ideas (e.g, some words or finishing thoughts) or speech is not clear   Understanding Verbal and Non-Verbal Content Understanding Verbal and Non-Verbal Content: 3. Usually understands - understands most conversations, but misses some part/intent of message. Requires cues at times to  understand  Memory/Recall Ability Memory/Recall Ability : That he or she is in a hospital/hospital unit   Oral Care Assessment Oral Assessment  (WDL): Exceptions to WDL Lips: Symmetrical Teeth: Missing (Comment) Tongue: Pink;Moist Mucous Membrane(s): Pink;Moist Saliva: Moist, saliva free flowing Level of Consciousness: Alert Is patient on any of following O2 devices?: None of the above Nutritional status: No high risk factors Oral Assessment Risk : Low Risk   Short Term Goals: Week 1: SLP Short Term Goal 1 (Week 1): Patient will demonstrate intellectual awareness of deficits via verbally stating 2 cognitive and 2 physical impairments given maxA SLP Short Term Goal 2 (Week 1): Patient will participate in bedside swallow evaluation to determine least restrictive safe diet and need for compensatory strategies SLP Short Term Goal 3 (Week 1): Patient will utilize external aids to orient to time and situation given modA SLP Short Term Goal 4 (Week 1): Patient will recall basic daily information and events in 80% of opportunities events given maxA SLP Short Term Goal 5 (Week 1): Patient will communicate wants and needs respectfully to nursing staff utilizing provided room mechanisms (i.e. call bell) given modA  Refer to Care Plan for Long Term Goals  Recommendations for other services: Neuropsych  Discharge Criteria: Patient will be discharged from SLP if patient refuses treatment 3 consecutive times without medical reason, if treatment goals not met, if there is a change in medical status, if patient makes no progress towards goals or if patient is discharged  from hospital.  The above assessment, treatment plan, treatment alternatives and goals were discussed and mutually agreed upon: by patient  [name], M.A., CCC-SLP   [name] 03/13/2023, 10:26 AM

## 2023-03-13 NOTE — Patient Care Conference (Signed)
Inpatient RehabilitationTeam Conference and Plan of Care Update Date: 03/13/2023   Time: 10:33 AM    Patient Name: [name] [name]      Medical Record Number: [id number]  Date of Birth: 1950-10-07 Sex: Male         Room/Bed: HD06C/HD06C Payor Info: Payor: MEDICARE / Plan: MEDICARE PART A AND B / Product Type: *No Product type* /    Admit Date/Time:  03/12/2023  4:25 PM  Primary Diagnosis:  Debility  Hospital Problems: Principal Problem:   Debility Active Problems:   Type 2 diabetes mellitus with chronic kidney disease on chronic dialysis, with long-term current use of insulin (HCC)   Acute metabolic encephalopathy   HFrEF (heart failure with reduced ejection fraction) (HCC)   Morbid obesity (HCC)   Encephalopathy   Epistaxis   PAF (paroxysmal atrial fibrillation) (HCC)   Long term (current) use of insulin (HCC)   ESRD on dialysis [facility])    Expected Discharge Date: Expected Discharge Date: 03/26/23  Team Members Present: Physician leading conference: Dr. Elijah Birk Social Worker Present: Cecile Sheerer, LCSWA Nurse Present: Vedia Pereyra, RN PT Present: Darrold Span, PT OT Present: Kearney Hard, OT SLP Present: Feliberto Gottron, SLP PPS Coordinator present : Fae Pippin, SLP     Current Status/Progress Goal Weekly Team Focus  Bowel/Bladder   Pt continent B/B LBM 03/02/23 per chart.   Will maintain normal B/B pattern   Assisst pt with toileting qshift/prn    Swallow/Nutrition/ Hydration   Eval Pending           ADL's   total A due to behaviors, mod A sit<>stands and stand-pivots   min A/supervision   self-care retraining, cognitive retraining, balance, activity tolerance, barriers are behaviors    Mobility   eval pending           Communication   Eval Pending            Safety/Cognition/ Behavioral Observations  Eval Pending            Pain   Denies pain at this time   will be free from pain   Assess pain qshift/prn    Skin    Skin is intact with excoriation to anus and abdominal bruising. Butt cream x4  daily to sacral area   Will maintain skin intergrity with no breakdown  Assess pt skin qshift/prn for breakdown      Discharge Planning:  Pt will d/c to home with his wife who is primary caregiver. No other natural supports. Wife would like forhim to be able to perform his own self-care but will complete if necessary. States she is unable to physically lift him. She will transport to/from dialysis- [facility] in [place] for TRS schedule . SW will confirm there are no barriers to discharge.   Team Discussion: Debility. Behavior plan meeting 03/14/23.  Agitated and verbally abusive.  Using TS, alarm waist belt and 4 side rails. Refusing to participate in therapy. Fall this AM without injury.  HD T/Th/Sat. Evaluations pending. OT was able to do evaluation but was total assist due to behaviors. Tolerating renal/carb-mod diet with 1200 fluid restrictions. Daily weight. AC/HS Patient on target to meet rehab goals: Will continue to work towards goals with discharge date of 03/26/23  *See Care Plan and progress notes for long and short-term goals.   Revisions to Treatment Plan:  Oxygen use at home.  Medication adjustments. Monitor labs/VS Teaching Needs: Medications, behavior adjustments, safety, self care, gait/transfer training, skin care, etc.  Current Barriers to Discharge: Decreased caregiver support, Incontinence, Wound care, Weight, Hemodialysis, Medication compliance, and Behavior  Possible Resolutions to Barriers: Family education Regain bowel/bladder control Continued healing of MASD Adhere to appropriate diet Compliance with medications Improved behaviors Order recommended DME if needed.      Medical Summary Current Status: medically complicated by AHRF/COPD, encephalopathy/delirium, bacteremia/peritonitis, ESRD on HD, uncontrolled pain, cadiac/arrhythmia, hyperphosphatemia, and anemia  Barriers to  Discharge: Behavior/Mood;Cardiac Complications;Electrolyte abnormality;Medical stability;Morbid Obesity;Oxygen Requirement;Renal Insufficiency/Failure;Self-care education;Symptomatic Anemia;Uncontrolled Pain  Barriers to Discharge Comments: encephalopathic/delirium, AHRF/COPD, uncontrolled pain, ESRD Possible Resolutions to [employer] Focus: behavioral/insight deficits will work on reorientation and limit sedating medications, oxygen titration and monitor respiratory status, titrate rate control medications, dialysis   Continued Need for Acute Rehabilitation Level of Care: The patient requires daily medical management by a physician with specialized training in physical medicine and rehabilitation for the following reasons: Direction of a multidisciplinary physical rehabilitation program to maximize functional independence : Yes Medical management of patient stability for increased activity during participation in an intensive rehabilitation regime.: Yes Analysis of laboratory values and/or radiology reports with any subsequent need for medication adjustment and/or medical intervention. : Yes   I attest that I was present, lead the team conference, and concur with the assessment and plan of the team.   [name] 03/13/2023, 1:33 PM

## 2023-03-13 NOTE — Discharge Summary (Signed)
Physician Discharge Summary  Patient ID: [name] [name] MRN: [id number] DOB/AGE: 73-Dec-1952 73 y.o.  Admit date: 03/12/2023 Discharge date: 05/07/2023  Discharge Diagnoses:  Principal Problem:   Acute metabolic encephalopathy Active Problems:   Type 2 diabetes mellitus with chronic kidney disease on chronic dialysis, with long-term current use of insulin (HCC)   Primary hypertension   HFrEF (heart failure with reduced ejection fraction) (HCC)   Morbid obesity (HCC)   Encephalopathy   Epistaxis   PAF (paroxysmal atrial fibrillation) (HCC)   Long term (current) use of insulin (HCC)   ESRD on dialysis (HCC)   Debility   Agitation   Hematuria GERD COPD CAD Hyperlipidemia Hypotension Back pain  Discharged Condition: stable  Significant Diagnostic Studies:  Narrative & Impression  CLINICAL DATA:  Cough.   EXAM: PORTABLE CHEST 1 VIEW   COMPARISON:  Chest x-ray dated February 28, 2023.   FINDINGS: Poor evaluation of the heart and mediastinal contours due to patient positioning and rotation. The cardiomediastinal silhouette is grossly stable status post CABG. Low lung volumes with mild left basilar atelectasis, right basilar consolidation, and small right pleural effusion. No definite pneumothorax. No acute osseous abnormality.   IMPRESSION: 1. Low lung volumes. Small right pleural effusion with adjacent right lower lobe atelectasis or infiltrate.     Electronically Signed   By: Obie Dredge M.D.   On: 04/19/2023 15:35      Narrative & Impression  CLINICAL DATA:  72 year old male with recent sepsis, pain, encephalopathy. COVID-19. Altered mental status.   EXAM: MRI HEAD WITHOUT CONTRAST   TECHNIQUE: Multiplanar, multiecho pulse sequences of the brain and surrounding structures were obtained without intravenous contrast.   COMPARISON:  Head CT 02/15/2023.   FINDINGS: Brain: Stable cerebral volume. No convincing restricted diffusion to suggest acute  infarction. No midline shift, mass effect, evidence of mass lesion, ventriculomegaly, extra-axial collection or acute intracranial hemorrhage. Cervicomedullary junction and pituitary are within normal limits.   Patchy and confluent bilateral cerebral white matter T2 and FLAIR hyperintensity, especially in the periventricular white matter. No cortical encephalomalacia. However, chronic microhemorrhage in the right thalamus, and also suggestion of chronic hemosiderin in the left superior frontal gyrus on SWI (series 7, image 77). Otherwise the deep gray nuclei, brainstem and cerebellum appear negative for age.   Vascular: Major intracranial vascular flow voids are preserved.   Skull and upper cervical spine: Chronic right lamina papyracea fracture. Otherwise negative. Visualized bone marrow signal is within normal limits.   Sinuses/Orbits: Postoperative changes to both globes.   Mild and chronic appearing left frontal sinus opacification. Other paranasal sinuses are stable and well aerated.   Other: Trace mastoid fluid bilaterally. Negative visible nasopharynx. Grossly normal other visible internal auditory structures. Negative visible scalp and face.   IMPRESSION: 1. No acute intracranial abnormality. 2. Moderate for age white matter signal changes with occasional chronic microhemorrhages, most commonly due to chronic small vessel disease.     Electronically Signed   By: Odessa Fleming M.D.   On: 03/16/2023 11:09      Labs:  Basic Metabolic Panel: Recent Labs  Lab 05/03/23 0820 05/05/23 0750  NA 134* 136  K 4.0 4.7  CL 94* 98  CO2 22 26  GLUCOSE 145* 155*  BUN 72* 69*  CREATININE 10.92* 11.05*  CALCIUM 9.3 9.2  9.2  PHOS 4.9* 4.1    CBC: Recent Labs  Lab 05/01/23 1322 05/03/23 0820 05/05/23 0750  WBC 3.8* 3.8* 4.1  HGB 9.5* 10.2* 9.4*  HCT  30.7* 33.7* 31.8*  MCV 90.0 89.9 89.6  PLT 137* 139* 131*    CBG: No results for input(s): "GLUCAP" in the last  168 hours.   Brief HPI:   [name] [name] is a 72 y.o. male who was admitted to acute care [facility] on 02/15/2023 after presenting to [facility] ED with acute metabolic encephalopathy assumed due to recent Covid infection PTA treated with Augmentin, multifocal pneumonia. History of ESRD on peritoneal dialysis and nephrology consulted. PD cath malfunction and temporary HD catheter placed. He was found to have sepsis secondary to peritoneal dialysis associated catheter peritonitis and polymicrobial bacteremia/fungemia. Broad spectrum antibiotics started. Hospital course complicated by worsening encephalopathy with hypoxemia requiring transfer to the ICU. He was found to have sepsis secondary to peritoneal dialysis associated catheter peritonitis and polymicrobial bacteremia/fungemia. Hospital course complicated by worsening encephalopathy with hypoxemia requiring transfer to the ICU. Blood cultures were positive for Candida glabrata and staph species and on 8/5>> PD catheter removed in OR. He also has a fistula that has been present in the left upper arm for many years but was unable to be used and is pulsatile on exam. Underwent fistulogram with balloon venoplasty on 8/14 by Dr. Karin Lieu. Fistula was used for HD treatment on 8/16. Discharged to [facility] on 8/16. He required Geodon prn for agitation. Noted to have 16 beat run of wide complex tachycardia 8/18. IV Unasyn completed on 8/21 for multifocal pneumonia. He has remained delirious with anxiety and continued clonazepam. Cardiology consultation obtained on 8/24, Dr. Algie Coffer. Hypomagnesemia corrected. Recommended to continue carvedilol 6.25 mg BID; consider increasing to 12.5 mg BID before starting low dose amiodarone. Consider Eliquis and discontinue asa and Plavix in one week if no active GI bleeding and stable hemoglobin. He developed back pain and MRI ordered; however, he was not cooperative for exam 8/25. Somnolent on exam. Noted orders in EPIC  for UA, MRI brain and lumbar spine and not aware of whether these will be done prior to CIR admission. Epistaxis resolved with Flonase and saline nose spray.  SLP earlier in admission with SLUMs 7/30. Tolerating carb modified diet. HD scheduled on T/TS.    Hospital Course: PRENTICE OHMAN was admitted to rehab 03/12/2023 for inpatient therapies to consist of PT, ST and OT at least three hours five days a week. Past admission physiatrist, therapy team and rehab RN have worked together to provide customized collaborative inpatient rehab. Unable to participate with HD due to agitation. Soft wrist restraints and waist belt applied after being found on the floor. No injury. HR 40 8/27, low 100s overnight; sating 87% on RA. Phos 6.6 this AM, other labs stable, Cr 11.16.  Nephrology has added Savellamer starting tonight. Scheduled Geodon 20 mg BID and Seroquel and clonazepam discontinued. As needed Zyprexa. Elavil 25 mg q HS for neuropathic pain and sleep. Neurontin discontinued. Able to obtain veil bed and remove wrist restraints. Consulted TRH service for acute delirium work-up. New labs ordered and LA came back 2.1 and repeated 8/28: 1.4. Started Depakote 125 mg q 8 hours on 8/28. Restraints discontinued. EEG performed 8/28 suggestive of mild diffuse encephalopathy, nonspecific etiology. No seizures or epileptiform discharges were seen throughout the recording.  Agitation improved, still struggling with completing full hemodialysis treatment sessions.  MRI brain able to be completed 8/30 which showed chronic microvascular changes but no acute abnormalities.   8/31--refusing multiple medications overnight and this morning; per discussion with patient's wife yesterday, did attempt to remove a.m. dose of  Geodon but due to worsening agitation and concern that patient would be unable to get required hemodialysis today, added medication back on. Due to poor availability of antipsychotics/agitation medications on dialysis  unit, added as needed Ativan 0.5 mg p.o. or IV. After a.m. Geodon, patient lethargic but most more relaxed, sitting at nurses station awake and oriented, eating lunch. Soft BP noticed and discussed with nephrology given premedication with midodrine for dialysis, they agree with scheduled 3 times daily to prevent hypoperfusion.   Refusing multiple doses of heparin [place] for DVT prophy. Changed to daily Lovenox 30 mg 9/05. Heparin for DVT prophy changed to Lovenox to improve compliance. -9/6 agitation appeared to be improving, patient is skeptical about medications and has been declining some of these.  Discontinued multivitamin, RisaQuad to try to decrease pill burden. EKG done for bradycardia 9/11 and consult placed for cardiology eval, Dr. Elease Hashimoto. Sinus rhythm with Mobitz type I and incomplete left bundle branch block. Tracing very similar to previous Mobitz type I seen following patient's bypass November 2023. Use caution with Geodon dosing, this can be associated with bradycardia. Given persistently low hemoglobin, favor discontinuing DAPT and transitioning to daily aspirin 81 mg only. Plavix discontinued. With patient's chronic anemia and agitation/recent falls, appears that risk of OAC>benefits at this time.  Recommend discontinuation of OAC given lack of evidence of recurrent atrial fibrillation.   Restlessness noted over night on 9/12. Continued to have waxing and waning agitation and somnolence. No longer utilizing enclosure bed 9/16. Telesitter continued. Psychology consulted and evaluated patient 9/18. Psychiatry consulted and Depakote discontinued. Started Abilify nightly and Depakote discontinued.   Neurology asked to eval and rec: discontinuing Atarax and Ativan. Folate, B12 and TSH within normal limits. Ammonia levels consistently normal. Found to have Vitamin C deficiency and aggressively repleted via infusion per psychiatry. Psychiatry feels the more appropriate diagnosis now is dementia. Depakote  resumed on 9/21 and increased to 250 mg q 12 hours on 10/04. Psychiatry adjusted to long-acting 250 mg nightly on 10/06.    Ongoing discussion regarding outpatient tolerability of hemodialysis. Consulted with vascular surgery regarding feasibility of placing PD catheter and converting from HD. Declined to offer this to the patient.  Resumed trazodone 50 mg q HS for sleep. 10/11: Long discussion with wife, by 10/15 we will need to dispo plan home with [facility] and ER for HD vs. to SNF with HD capabilities. Currently, no options for home or OP dialysis. Continuing to work on improving behaviors so that he may qualify for outpatient dialysis. Not considering hospice/palliative at this time. 10/16: discussed nephrology, given significant behavioral improvements will continue dialysis in chair earlier in the day, and referral back to [facility]. 10/16, have Atarax first-line and Ativan 1 mg scheduled predialysis for agitation; redirect and avoid overstimulation as much as possible.  Avoid increasing sedating medications as much as possible, as there is a direct effect on patient's sleep, confusion, and agitation up to a day after receiving PRN antipsychotics and Ativan. 10-18: Clarified with nursing yesterday for premedication with gabapentin, Ativan not to be given unless ongoing agitated behaviors.  Do feel this may have contributed to him getting more agitated/confused yesterday.  10/ 20 much better, tolerated HD 10/19: Per Dr. Thedore Mins, ESRD: Transitioned from PD to HD - TTS schedule. Using old AVF which is still functional. TRYING TO DIALYZE AS EARLY AS POSSIBLE IN DAY IN RECLINER, this makes a huge difference because he gets significant sundowning - did tremendously better on 10/15 and 10/19.  Has  been accepted back to [facility].   Blood pressures were monitored on TID basis and midodrine as needed during HD sessions continued. Changed to TID dosing 8/31. 9-1: Consistently 90s over 60s, has been stable but given  findings of microvascular changes on MRI, may be too low for patient to perfuse properly. Added midodrine 5 mg tid and monitor. DC Coreg as heart rate has been low to normal. Midodrine discontinued 9/13. Amlodipine 2.5 mg added 10/11 on for consistent elevated BP.  DM-2 with A1c of 6.3%. Continued SSI at admission but not requiring coverage. SSI and CBGs changed to only as needed for lethargy, symptoms due to aggitation  Rehab course: During patient's stay in rehab weekly team conferences were held to monitor patient's progress, set goals and discuss barriers to discharge. At admission, patient required total with mobility and basic self-care skills.  He has had some improvement in activity tolerance, balance, postural control as well as ability to compensate for deficits. He has had improvement in functional use RUE/LUE  and RLE/LLE as well as improvement in awareness He was able to come from sit to stand with close S  using the grab bars and was able to doff his pants with close S. The pt was able to complete toileting hygiene and donning her pants with close S. The pt was returned to his living quarters and was able to wash his hands at sink LOF with close S.      Discharge disposition: 06-Home-Health Care Svc     Diet: Renal/carb modified  Special Instructions: No driving, alcohol consumption or tobacco use.  Will need outpatient follow-up of LFTs.   Atarax first-line and Ativan 1 mg scheduled predialysis for agitation; redirect and avoid overstimulation as much as possible.  Avoid increasing sedating medications as much as possible, as there is a direct effect on patient's sleep, confusion, and agitation up to a day after receiving PRN antipsychotics and Ativan.  Has follow-up arranged with [facility], Dr. Rennis Golden.  Discharge Instructions     Ambulatory referral to Physical Medicine Rehab   Complete by: As directed    Discharge patient   Complete by: As directed    Discharge disposition:  06-Home-Health Care Svc   Discharge patient date: 05/07/2023      Allergies as of 05/07/2023       Reactions   Opana [oxymorphone Hcl] Itching   Tramadol Itching        Medication List     STOP taking these medications    clopidogrel 75 MG tablet Commonly known as: Plavix   feeding supplement Liqd   fluticasone 50 MCG/ACT nasal spray Commonly known as: FLONASE   insulin aspart 100 UNIT/ML injection Commonly known as: novoLOG   midodrine 10 MG tablet Commonly known as: PROAMATINE   multivitamin Tabs tablet   nitroGLYCERIN 0.4 MG SL tablet Commonly known as: NITROSTAT   Symbicort 160-4.5 MCG/ACT inhaler Generic drug: budesonide-formoterol Replaced by: Earlie Server 200-25 MCG/ACT Aepb       TAKE these medications    acetaminophen 325 MG tablet Commonly known as: TYLENOL Take 1-2 tablets (325-650 mg total) by mouth every 4 (four) hours as needed for mild pain (pain score 1-3).   albuterol 108 (90 Base) MCG/ACT inhaler Commonly known as: VENTOLIN HFA Inhale 2 puffs into the lungs every 6 (six) hours as needed for wheezing or shortness of breath.   amLODipine 5 MG tablet Commonly known as: NORVASC Take 1 tablet (5 mg total) by mouth daily.  ARIPiprazole 10 MG tablet Commonly known as: ABILIFY Take 1 tablet (10 mg total) by mouth at bedtime.   ascorbic acid 500 MG tablet Commonly known as: VITAMIN C Take 1 tablet (500 mg total) by mouth daily.   aspirin EC 81 MG tablet Take 81 mg by mouth daily. Swallow whole.   atorvastatin 80 MG tablet Commonly known as: LIPITOR Take 1 tablet (80 mg total) by mouth daily.   Breo Ellipta 200-25 MCG/ACT Aepb Generic drug: fluticasone furoate-vilanterol Use 1 (one) inhaltion once a day. Replaces: Symbicort 160-4.5 MCG/ACT inhaler   Darbepoetin Alfa 150 MCG/0.3ML Sosy injection Commonly known as: ARANESP Inject 0.3 mLs (150 mcg total) into the skin every Sunday at 6pm. Start taking on: May 13, 2023    divalproex 250 MG DR tablet Commonly known as: DEPAKOTE Take 1 tablet (250 mg total) by mouth at bedtime.   gabapentin 300 MG capsule Commonly known as: NEURONTIN Take 1 capsule (300 mg total) by mouth Every Tuesday,Thursday,and Saturday with dialysis. Start taking on: May 08, 2023   Gerhardt's butt cream Crea Apply 1 Application topically 4 (four) times daily.   hydrOXYzine 10 MG tablet Commonly known as: ATARAX Take 1 tablet (10 mg total) by mouth 3 (three) times daily as needed for itching or anxiety (Give before dialysis.).   LORazepam 0.5 MG tablet Commonly known as: ATIVAN Take 1 tablet (0.5 mg total) by mouth daily as needed for anxiety (ONLY WITH HD).   pantoprazole 40 MG tablet Commonly known as: PROTONIX Take 1 tablet (40 mg total) by mouth 2 (two) times daily.   traZODone 50 MG tablet Commonly known as: DESYREL Take 1 tablet (50 mg total) by mouth at bedtime.        Follow-up Information     [name], Wayland Salinas, MD Follow up.   Specialty: Internal Medicine Why: Call the office in 1-2 days to make arrangements for hospital follow-up. Contact information: [address] [place] [address] [phone]         Elijah Birk C, DO Follow up.   Specialty: Physical Medicine and Rehabilitation Why: office will call you to arrange your appt (sent) Contact information: [address] [place] [address] [phone]         Chrystie Nose, MD Follow up.   Specialty: Cardiology Why: Call the office in 1-2 days to make arrangements for hospital follow-up. Contact information: [address] [place] [address] [place] [address] [phone]         Dialysis, Nolon Lennert. Go on 05/08/2023.   Why: Schedule is Tuesday, Thursday, Saturday with 10:30 am chair time.  On Tuesday (10/22), please arrive at 9:45 am for paperwork prior to treatment. Contact information: [address] [place]  [address] [phone]                 Signed: Milinda Antis 05/07/2023, 11:50 AM

## 2023-03-13 NOTE — Plan of Care (Signed)
Problem: RH Balance Goal: LTG: Patient will maintain dynamic sitting balance (OT) Description: LTG:  Patient will maintain dynamic sitting balance with assistance during activities of daily living (OT) Flowsheets (Taken 03/13/2023 0946) LTG: Pt will maintain dynamic sitting balance during ADLs with: Supervision/Verbal cueing Goal: LTG Patient will maintain dynamic standing with ADLs (OT) Description: LTG:  Patient will maintain dynamic standing balance with assist during activities of daily living (OT)  Flowsheets (Taken 03/13/2023 0946) LTG: Pt will maintain dynamic standing balance during ADLs with: Contact Guard/Touching assist   Problem: Sit to Stand Goal: LTG:  Patient will perform sit to stand in prep for activites of daily living with assistance level (OT) Description: LTG:  Patient will perform sit to stand in prep for activites of daily living with assistance level (OT) Flowsheets (Taken 03/13/2023 0946) LTG: PT will perform sit to stand in prep for activites of daily living with assistance level: Contact Guard/Touching assist   Problem: RH Eating Goal: LTG Patient will perform eating w/assist, cues/equip (OT) Description: LTG: Patient will perform eating with assist, with/without cues using equipment (OT) Flowsheets (Taken 03/13/2023 0946) LTG: Pt will perform eating with assistance level of: Supervision/Verbal cueing   Problem: RH Grooming Goal: LTG Patient will perform grooming w/assist,cues/equip (OT) Description: LTG: Patient will perform grooming with assist, with/without cues using equipment (OT) Flowsheets (Taken 03/13/2023 0946) LTG: Pt will perform grooming with assistance level of: Supervision/Verbal cueing   Problem: RH Bathing Goal: LTG Patient will bathe all body parts with assist levels (OT) Description: LTG: Patient will bathe all body parts with assist levels (OT) Flowsheets (Taken 03/13/2023 0946) LTG: Pt will perform bathing with assistance level/cueing:  Minimal Assistance - Patient > 75%   Problem: RH Dressing Goal: LTG Patient will perform upper body dressing (OT) Description: LTG Patient will perform upper body dressing with assist, with/without cues (OT). Flowsheets (Taken 03/13/2023 0946) LTG: Pt will perform upper body dressing with assistance level of: Contact Guard/Touching assist Goal: LTG Patient will perform lower body dressing w/assist (OT) Description: LTG: Patient will perform lower body dressing with assist, with/without cues in positioning using equipment (OT) Flowsheets (Taken 03/13/2023 0946) LTG: Pt will perform lower body dressing with assistance level of: Minimal Assistance - Patient > 75%   Problem: RH Toileting Goal: LTG Patient will perform toileting task (3/3 steps) with assistance level (OT) Description: LTG: Patient will perform toileting task (3/3 steps) with assistance level (OT)  Flowsheets (Taken 03/13/2023 0946) LTG: Pt will perform toileting task (3/3 steps) with assistance level: Contact Guard/Touching assist   Problem: RH Toilet Transfers Goal: LTG Patient will perform toilet transfers w/assist (OT) Description: LTG: Patient will perform toilet transfers with assist, with/without cues using equipment (OT) Flowsheets (Taken 03/13/2023 0946) LTG: Pt will perform toilet transfers with assistance level of: Contact Guard/Touching assist   Problem: RH Tub/Shower Transfers Goal: LTG Patient will perform tub/shower transfers w/assist (OT) Description: LTG: Patient will perform tub/shower transfers with assist, with/without cues using equipment (OT) Flowsheets (Taken 03/13/2023 0946) LTG: Pt will perform tub/shower stall transfers with assistance level of: Contact Guard/Touching assist   Problem: RH Attention Goal: LTG Patient will demonstrate this level of attention during functional activites (OT) Description: LTG:  Patient will demonstrate this level of attention during functional activites  (OT) Flowsheets  (Taken 03/13/2023 0946) LTG: Patient will demonstrate this level of attention during functional activites (OT): Minimal Assistance - Patient > 75%   Problem: RH Awareness Goal: LTG: Patient will demonstrate awareness during functional activites type of (OT)  Description: LTG: Patient will demonstrate awareness during functional activites type of (OT) Flowsheets (Taken 03/13/2023 0946) LTG: Patient will demonstrate awareness during functional activites type of (OT): Minimal Assistance - Patient > 75%

## 2023-03-13 NOTE — Progress Notes (Signed)
Inpatient Rehabilitation Care Coordinator Assessment and Plan Patient Details  Name: [name] [name] MRN: [id number] Date of Birth: [birth year]  Today's Date: 03/13/2023  Hospital Problems: Principal Problem:   Debility Active Problems:   Type 2 diabetes mellitus with chronic kidney disease on chronic dialysis, with long-term current use of insulin (HCC)   Acute metabolic encephalopathy   HFrEF (heart failure with reduced ejection fraction) (HCC)   Morbid obesity (HCC)   Encephalopathy   Epistaxis   PAF (paroxysmal atrial fibrillation) (HCC)   Long term (current) use of insulin (HCC)   ESRD on dialysis [facility])  Past Medical History:  Past Medical History:  Diagnosis Date   Anemia    Arthritis    Asthma    Bell palsy    CAD (coronary artery disease)    a. 2014 MV: abnl w/ infap ischemia; b. 03/2013 Cath: aneurysmal bleb in the LAD w/ otw nonobs dzs-->Med Rx.   Chronic back pain    Chronic knee pain    a. 09/2015 s/p R TKA.   Chronic pain    Chronic shoulder pain    Chronic sinusitis    COPD (chronic obstructive pulmonary disease) (HCC)    Diabetes mellitus without complication (HCC)    type II    ESRD on peritoneal dialysis (HCC)    on peritoneal dialysis, [facility]   Essential hypertension    GERD (gastroesophageal reflux disease)    Gout    Gout    Hepatomegaly    noted on noncontrast CT 2015   History of hiatal hernia    Hyperlipidemia    Lateral meniscus tear    Obesity    Truncal   Obstructive sleep apnea    does not use cpap    On home oxygen therapy    uses 2l when is going somewhere per patient    PUD (peptic ulcer disease)    remote, reports f/u EGD about 8 years ago unremarkable    Reactive airway disease    related to exposure to chemical during 9/11   Sinusitis    Vitamin D deficiency    Past Surgical History:  Past Surgical History:  Procedure Laterality Date   A/V FISTULAGRAM N/A 02/28/2023   Procedure: A/V Fistulagram;  Surgeon:  Victorino Sparrow, MD;  Location: [facility] INVASIVE CV LAB;  Service: Cardiovascular;  Laterality: N/A;   ASAD LT SHOULDER  12/15/2008   left shoulder   AV FISTULA PLACEMENT Left 08/09/2016   Procedure: BRACHIOCEPHALIC ARTERIOVENOUS (AV) FISTULA CREATION LEFT ARM;  Surgeon: Sherren Kerns, MD;  Location: [facility] OR;  Service: Vascular;  Laterality: Left;   CAPD INSERTION N/A 10/07/2018   Procedure: LAPAROSCOPIC PERITONEAL CATHETER PLACEMENT;  Surgeon: Rodman Pickle, MD;  Location: [facility] ORS;  Service: General;  Laterality: N/A;   CATARACT EXTRACTION W/PHACO Left 03/28/2016   Procedure: CATARACT EXTRACTION PHACO AND INTRAOCULAR LENS PLACEMENT LEFT EYE;  Surgeon: Jethro Bolus, MD;  Location: [facility] ORS;  Service: Ophthalmology;  Laterality: Left;  CDE: 4.77   CATARACT EXTRACTION W/PHACO Right 04/11/2016   Procedure: CATARACT EXTRACTION PHACO AND INTRAOCULAR LENS PLACEMENT RIGHT EYE; CDE:  4.74;  Surgeon: Jethro Bolus, MD;  Location: [facility] ORS;  Service: Ophthalmology;  Laterality: Right;   COLONOSCOPY  10/15/2008   Fields: Rectal polyp obliterated, not retrieved, hemorrhoids, single ascending colon diverticulum near the CV. Next colonoscopy April 2020   COLONOSCOPY N/A 12/25/2014   SLF: 1. Colorectal polyps (2) removed 2. Small internal hemorrhoids 3. the left colon is severely redundant.  hyperplastic polyps   CORONARY ARTERY BYPASS GRAFT N/A 06/05/2022   Procedure: OFF PUMP CORONARY ARTERY BYPASS GRAFTING (CABG) X 2 BYPASSES USING LEFT INTERNAL MAMMARY ARTERY AND RIGHT LEG GREATER SAPHENOUS VEIN HARVESTED ENDOSCOPICALLY;  Surgeon: Corliss Skains, MD;  Location: [facility] OR;  Service: Open Heart Surgery;  Laterality: N/A;   CORONARY STENT INTERVENTION N/A 07/25/2021   Procedure: CORONARY STENT INTERVENTION;  Surgeon: Tonny Bollman, MD;  Location: [facility] INVASIVE CV LAB;  Service: Cardiovascular;  Laterality: N/A;   CORONARY STENT INTERVENTION N/A 12/26/2021   Procedure: CORONARY STENT INTERVENTION;  Surgeon: Yvonne Kendall, MD;  Location: [facility] INVASIVE CV LAB;  Service: Cardiovascular;  Laterality: N/A;   CORONARY STENT INTERVENTION N/A 01/20/2022   Procedure: CORONARY STENT INTERVENTION;  Surgeon: Tonny Bollman, MD;  Location: [facility] INVASIVE CV LAB;  Service: Cardiovascular;  Laterality: N/A;   DOPPLER ECHOCARDIOGRAPHY     ESOPHAGOGASTRODUODENOSCOPY N/A 12/25/2014   SLF: 1. Anemia most likely due to CRI, gastritis, gastric polyps 2. Moderate non-erosive gastriits and mild duodenitis.  3.TWo large gstric polyps removed.    EYE SURGERY  12/22/2010   tear duct probing-[place]   FOREIGN BODY REMOVAL  03/29/2011   Procedure: REMOVAL FOREIGN BODY EXTREMITY;  Surgeon: Fuller Canada, MD;  Location: [facility]S;  Service: Orthopedics;  Laterality: Right;  Removal Foreign Body Right Thumb   IR FLUORO GUIDE CV LINE RIGHT  08/06/2018   IR FLUORO GUIDE CV LINE RIGHT  02/17/2023   IR US GUIDE VASC ACCESS RIGHT  08/06/2018   IR US GUIDE VASC ACCESS RIGHT  02/17/2023   KNEE ARTHROSCOPY  10/16/2007   left   KNEE ARTHROSCOPY WITH LATERAL MENISECTOMY Right 10/14/2015   Procedure: LEFT KNEE ARTHROSCOPY WITH PARTIAL LATERAL MENISECTOMY;  Surgeon: Vickki Hearing, MD;  Location: [facility]S;  Service: Orthopedics;  Laterality: Right;   LEFT HEART CATH AND CORONARY ANGIOGRAPHY N/A 07/25/2021   Procedure: LEFT HEART CATH AND CORONARY ANGIOGRAPHY;  Surgeon: Tonny Bollman, MD;  Location: [facility] INVASIVE CV LAB;  Service: Cardiovascular;  Laterality: N/A;   LEFT HEART CATH AND CORONARY ANGIOGRAPHY N/A 12/26/2021   Procedure: LEFT HEART CATH AND CORONARY ANGIOGRAPHY;  Surgeon: Yvonne Kendall, MD;  Location: [facility] INVASIVE CV LAB;  Service: Cardiovascular;  Laterality: N/A;   LEFT HEART CATH AND CORONARY ANGIOGRAPHY N/A 01/20/2022   Procedure: LEFT HEART CATH AND CORONARY ANGIOGRAPHY;  Surgeon: Tonny Bollman, MD;  Location: [facility] INVASIVE CV LAB;  Service: Cardiovascular;  Laterality: N/A;   LEFT HEART CATHETERIZATION WITH CORONARY ANGIOGRAM N/A  03/28/2013   Procedure: LEFT HEART CATHETERIZATION WITH CORONARY ANGIOGRAM;  Surgeon: Marykay Lex, MD;  Location: [facility] CATH LAB;  Service: Cardiovascular;  Laterality: N/A;   NM MYOVIEW LTD     PENILE PROSTHESIS IMPLANT N/A 08/16/2015   Procedure: PENILE PROTHESIS INFLATABLE, three piece, Excisional biopsy of Penile ulcer, Penile molding;  Surgeon: Jethro Bolus, MD;  Location: [facility] ORS;  Service: Urology;  Laterality: N/A;   PENILE PROSTHESIS IMPLANT N/A 12/24/2017   Procedure: REMOVAL AND  REPLACEMENT  COLOPLAST PENILE PROSTHESIS;  Surgeon: Crista Elliot, MD;  Location: [facility] ORS;  Service: Urology;  Laterality: N/A;   PERIPHERAL VASCULAR BALLOON ANGIOPLASTY  02/28/2023   Procedure: PERIPHERAL VASCULAR BALLOON ANGIOPLASTY;  Surgeon: Victorino Sparrow, MD;  Location: [facility] INVASIVE CV LAB;  Service: Cardiovascular;;   PORT-A-CATH REMOVAL N/A 02/19/2023   Procedure: REMOVAL OF PERITONEAL DIALYSIS CATHETER;  Surgeon: Victorino Sparrow, MD;  Location: [facility] OR;  Service: Vascular;  Laterality: N/A;   QUADRICEPS  TENDON REPAIR  07/21/2011   Procedure: REPAIR QUADRICEP TENDON;  Surgeon: Fuller Canada, MD;  Location: [facility];  Service: Orthopedics;  Laterality: Right;   RIGHT/LEFT HEART CATH AND CORONARY ANGIOGRAPHY N/A 05/30/2022   Procedure: RIGHT/LEFT HEART CATH AND CORONARY ANGIOGRAPHY;  Surgeon: Corky Crafts, MD;  Location: [facility] INVASIVE CV LAB;  Service: Cardiovascular;  Laterality: N/A;   TEE WITHOUT CARDIOVERSION N/A 06/05/2022   Procedure: TRANSESOPHAGEAL ECHOCARDIOGRAM (TEE);  Surgeon: Corliss Skains, MD;  Location: [facility] OR;  Service: Open Heart Surgery;  Laterality: N/A;   TOENAIL EXCISION     removed x2-bilateral   UMBILICAL HERNIA REPAIR  07/17/2005   [place]   Social History:  reports that he quit smoking about 12 years ago. His smoking use included cigarettes. He started smoking about 37 years ago. He has a 25 pack-year smoking history. He has never used smokeless tobacco. He  reports that he does not currently use alcohol. He reports current drug use. Drug: Marijuana.  Family / Support Systems Marital Status: Married How Long?: 52 years Patient Roles: Spouse, Parent Spouse/Significant Other: [name] (wifE) [phone] Children: two adult daughters. Dtrs live in [place] and [place]. Both to help PRN. Other Supports: none Anticipated Caregiver: Wife is primary caregiver Ability/Limitations of Caregiver: Wife is not able to provide physical assistance. Caregiver Availability: 24/7 Family Dynamics: Pt lives with his wife.  Social History Preferred language: English Religion: Baptist Cultural Background: Pt worked as a Naval architect for 32 years. Education: high school grad Health Literacy - How often do you need to have someone help you when you read instructions, pamphlets, or other written material from your doctor or pharmacy?: Never Writes: Yes Employment Status: Retired Date Retired/Disabled/Unemployed: 2005 Marine scientist Issues: Denies Guardian/Conservator: Product manager- wife   Abuse/Neglect Abuse/Neglect Assessment Can Be Completed: Unable to assess, patient is non-responsive or altered mental status  Patient response to: Social Isolation - How often do you feel lonely or isolated from those around you?: Patient unable to respond  Emotional Status Pt's affect, behavior and adjustment status: Pt is unable to complete assessment due to behaviors. Recent Psychosocial Issues: Denies Psychiatric History: Denies Substance Abuse History: Pt wife reports he quit smoking cigarettes 12-13 yrs ago. Occassional etoh; quit marijauan over 1-2 yrs ago.  Patient / Family Perceptions, Expectations & Goals Pt/Family understanding of illness & functional limitations: Pt wife has a general understanding of pt care needs Premorbid pt/family roles/activities: Independent Anticipated changes in roles/activities/participation: Pt will require continued  ADLs/IADLs Pt/family expectations/goals: Wife's goal is for pt to be able to be as independent as possible, and perform his personal care needs.  Community Resources [name]: None Premorbid Home Care/DME Agencies: None Transportation available at discharge: TBD Is the patient able to respond to transportation needs?: Yes In the past 12 months, has lack of transportation kept you from medical appointments or from getting medications?: No In the past 12 months, has lack of transportation kept you from meetings, work, or from getting things needed for daily living?: No Resource referrals recommended: Neuropsychology  Discharge Planning Living Arrangements: Spouse/significant other Support Systems: Spouse/significant other Type of Residence: Private residence Insurance Resources: Electrical engineer Resources: Restaurant manager, fast food Screen Referred: No Living Expenses: Own Money Management: Spouse Does the patient have any problems obtaining your medications?: No Home Management: Pt wife manages all homecare needs Patient/Family Preliminary Plans: TBD Care Coordinator Barriers to Discharge: Decreased caregiver support, Lack of/limited family support, Insurance for SNF coverage, Other (comments) Care Coordinator Barriers to Discharge Comments:  behaviors Care Coordinator Anticipated Follow Up Needs: HH/OP Expected length of stay: d/c 9/9  Clinical Impression SW completed assessment with pt wife. Pt is not a Cytogeneticist. HCPOA- wife. DME: cane, and access to crutches, RW, and shower seat.   Gretchen Short 03/13/2023, 3:33 PM

## 2023-03-13 NOTE — Progress Notes (Signed)
Patient had an Enclosure bed ordered on 8/26 at 1655. Hospital did not have one available so enclosure bed was never brought up to the unit. Order was discontinued today.

## 2023-03-13 NOTE — Significant Event (Addendum)
Throughout the day, patient with ongoing agitated behaviors, with threatening of staff and refusal to participate in therapies.  On chart review and after discussion with wife, this appears to have been his baseline since approximately 8-22, although may have started escalating around 8-19.  Current workup has been unrevealing, MRI thoracic spine without contrast was completed showing moderate stenosis at C7-T1 and recommending contrast study, MRI brain was unable to be completed due to agitation.  Labs and vitals have remained fairly stable.  He was unable to finish dialysis today due to threatening of staff, unresponsive to IM Haldol 2 mg.  Earliest possible reattempt tomorrow per nephrology.  Given that he finished IV antibiotic treatment on 8-21, expect infection related encephalopathy would have improved by this time.  Has tried multiple as needed antipsychotics at [facility], most effective appears to be Geodon.  Per wife, patient slept well overnight last night, but prior to that had days and nights flipped.   Plan as below; -Consult hospitalist for further workup on potential contributors to encephalopathy.  - Add on ammonia to today's labs, Urinalysis when patient able to tolerate straight cath; currently too agitated to tolerate blood work. -Delirium precautions, sleep log, Veiled bed for safety and to minimize restraints -Scheduled Geodon 20 mg twice daily; discontinue Seroquel and clonazepam due to no perceivable benefit -Add as needed Zyprexa 5 mg twice daily p.o. or IM due to recent cardiac arrhythmias and concern for QTc prolongation -Suspect uncontrolled abdominal and neuropathic pain contributing to delirium, will add Elavil 25 mg nightly for neuropathic pain and sleep. As needed ramelteon for sleep. - Stop gabapentin due to possible side effect of agitation  -Once behaviors better under control, agree with reattempting brain MRI.  Will also get neurology involved if hospitalist/infectious  workup unrevealing.  -Wife has expressed that she is not interested in palliative care at this time; hopefully with above measures patient can get dialysis in the next 48 hours to limit worsening uremic encephalopathy and medical decline   Angelina Sheriff, DO 03/13/2023

## 2023-03-13 NOTE — Progress Notes (Signed)
Inpatient Rehabilitation  Patient information reviewed and entered into eRehab system by Melissa M. Bowie, M.A., CCC/SLP, PPS Coordinator.  Information including medical coding, functional ability and quality indicators will be reviewed and updated through discharge.    

## 2023-03-13 NOTE — Plan of Care (Signed)
  Problem: RH Swallowing Goal: LTG Patient will consume least restrictive diet using compensatory strategies with assistance (SLP) Description: LTG:  Patient will consume least restrictive diet using compensatory strategies with assistance (SLP) Flowsheets (Taken 03/13/2023 1255) LTG: Pt Patient will consume least restrictive diet using compensatory strategies with assistance of (SLP): Modified Independent   Problem: RH Cognition - SLP Goal: RH LTG Patient will demonstrate orientation with cues Description:  LTG:  Patient will demonstrate orientation to person/place/time/situation with cues (SLP)   Flowsheets (Taken 03/13/2023 1255) LTG Patient will demonstrate orientation to:  Person  Place  Time  Situation LTG: Patient will demonstrate orientation using cueing (SLP): Minimal Assistance - Patient > 75%   Problem: RH Memory Goal: LTG Patient will demonstrate ability for day to day (SLP) Description: LTG:   Patient will demonstrate ability for day to day recall/carryover during cognitive/linguistic activities with assist  (SLP) Flowsheets (Taken 03/13/2023 1255) LTG: Patient will demonstrate ability for day to day recall: New information LTG: Patient will demonstrate ability for day to day recall/carryover during cognitive/linguistic activities with assist (SLP): Minimal Assistance - Patient > 75%   Problem: RH Awareness Goal: LTG: Patient will demonstrate awareness during functional activites type of (SLP) Description: LTG: Patient will demonstrate awareness during functional activites type of (SLP) Flowsheets (Taken 03/13/2023 1255) Patient will demonstrate during cognitive/linguistic activities awareness type of: Intellectual LTG: Patient will demonstrate awareness during cognitive/linguistic activities with assistance of (SLP): Minimal Assistance - Patient > 75%

## 2023-03-13 NOTE — Progress Notes (Signed)
Inpatient Rehabilitation Admission Medication Review by a Pharmacist  A complete drug regimen review was completed for this patient to identify any potential clinically significant medication issues.  High Risk Drug Classes Is patient taking? Indication by Medication  Antipsychotic Yes Seroquel/Geodon - delirium  Anticoagulant Yes Heparin - DVT px  Antibiotic No   Opioid No   Antiplatelet Yes Plavix/ASA - CAD  Hypoglycemics/insulin Yes SSI - DM  Vasoactive Medication Yes Coreg - afib  Chemotherapy No   Other Yes Clonezepam - delirium Atorvastatin - HLD Acidophilus - prophylaxis Aranesp - anemia Midodrine - hypotension Dulera - COPD Lidocaine patch - pain     Type of Medication Issue Identified Description of Issue Recommendation(s)  Drug Interaction(s) (clinically significant)     Duplicate Therapy     Allergy     No Medication Administration End Date     Incorrect Dose     Additional Drug Therapy Needed     Significant med changes from prior encounter (inform family/care partners about these prior to discharge).    Other       Clinically significant medication issues were identified that warrant physician communication and completion of prescribed/recommended actions by midnight of the next day:  No  Name of provider notified for urgent issues identified:   Provider Method of Notification:     Pharmacist comments:   Time spent performing this drug regimen review (minutes):  20  Ulyses Southward, PharmD, [place], AAHIVP, CPP Infectious Disease Pharmacist 03/13/2023 7:28 AM

## 2023-03-13 NOTE — Progress Notes (Addendum)
Patient ID: [name] [name], male   DOB: 06-01-1951, 72 y.o.   MRN: [id number]  0924-SW made contact with pt wife [name] [name] to introduce self, explain role, and discuss discharge process. SW completed assessment with pt wife due to current behaviors with patient. She will be his primary caregiver and no other natural supports.   1329- SW followed up with pt wife to provide updates from team conference, and d/c date 9/9. Pt wife has various medical concerns- can be tested for UTI and if testing for dementia is appropriate, will he have the MRI done, and reports concerns about him being constipated.  Fam edu scheduled for Wed (9/4) 1pm-4pm. SW informed informed medical team and attending will follow-up.  Cecile Sheerer, MSW, LCSWA Office: [phone] Cell: [phone] Fax: [phone]

## 2023-03-13 NOTE — Progress Notes (Signed)
PROGRESS NOTE   Subjective/Complaints:  Fall OOB this AM, no head strike, no LOC, refused post-fall vitals.  Intermittently agitated this a.m., refusing therapies, refusing vitals, lashing out at staff.  HR 40 this AM, low 100s overnight; sating 87% on RA.  Phos 6.6 this AM, other labs stable, Cr 11.16.  Nephrology has added Savellamer starting tonight. Continent b/b, had BM this AM.   Complaining of pain in his toes feeling like they are "breaking" also complains of being cold  ROS: Per HPI above  Objective:   No results found. Recent Labs    03/13/23 0816  WBC 4.3  HGB 8.1*  HCT 27.5*  PLT 188   No results for input(s): "NA", "K", "CL", "CO2", "GLUCOSE", "BUN", "CREATININE", "CALCIUM" in the last 72 hours.  Intake/Output Summary (Last 24 hours) at 03/13/2023 0932 Last data filed at 03/13/2023 0736 Gross per 24 hour  Intake 240 ml  Output --  Net 240 ml        Physical Exam: Vital Signs Blood pressure 123/73, pulse (!) 40, temperature 97.8 F (36.6 C), resp. rate 17, height 5\' 4"  (1.626 m), weight 101.5 kg, SpO2 (!) 87%.     Assessment/Plan: 1. Functional deficits which require 3+ hours per day of interdisciplinary therapy in a comprehensive inpatient rehab setting. Physiatrist is providing close team supervision and 24 hour management of active medical problems listed below. Physiatrist and rehab team continue to assess barriers to discharge/monitor patient progress toward functional and medical goals  Care Tool:  Bathing    Body parts bathed by patient: Right arm, Left arm, Chest, Abdomen, Front perineal area, Right upper leg, Buttocks, Left upper leg, Right lower leg, Left lower leg, Face         Bathing assist Assist Level: Total Assistance - Patient < 25% (due to pt refusing to assist)     Upper Body Dressing/Undressing Upper body dressing        Upper body assist Assist Level: Total  Assistance - Patient < 25%    Lower Body Dressing/Undressing    PE: Constitution: Appropriate appearance for age. No apparent distress +Obese Resp: No respiratory distress. No accessory muscle usage. on RA and CTAB Cardio: Well perfused appearance. No peripheral edema.  Peripheral pulses 2+, intact, mildly tachycardic. + Left upper extremity fistula Abdomen: Moderately distended. Nontender.  Normoactive bowel sounds Psych: Agitated, inappropriate.  Neurologic Exam:   Awake, alert.  Oriented to self only.  Not oriented to place or time with cues.  Understands he is in the hospital due to infection. + Cognitive delay + Poor insight + Intermittent hallucinations/confabulations Babinsky: flexor responses b/l.   Hoffmans: negative b/l Sensory exam: Responsive to touch in all 4 extremities Motor exam: Antigravity and against resistance in all 4 extremities Coordination: Slight bilateral upper extremity tremor, mild  Musculoskeletal: No joint swelling noted No abnormal tone    Lower body dressing      What is the patient wearing?: Pants     Lower body assist Assist for lower body dressing: Total Assistance - Patient < 25%     Toileting Toileting    Toileting assist Assist for toileting: 2 Helpers  Transfers Chair/bed transfer  Transfers assist     Chair/bed transfer assist level: Moderate Assistance - Patient 50 - 74%     Locomotion Ambulation   Ambulation assist              Walk 10 feet activity   Assist           Walk 50 feet activity   Assist           Walk 150 feet activity   Assist           Walk 10 feet on uneven surface  activity   Assist           Wheelchair     Assist               Wheelchair 50 feet with 2 turns activity    Assist            Wheelchair 150 feet activity     Assist          Blood pressure 123/73, pulse (!) 40, temperature 97.8 F (36.6 C), resp. rate 17,  height 5\' 4"  (1.626 m), weight 101.5 kg, SpO2 (!) 87%.  Medical Problem List and Plan: 1. Functional deficits secondary to debility due to sepsis secondary to catheter peritonitis from PD catheter with resultant encephalopathy and hypoxemia              -patient may not shower             -ELOS/Goals: 16-18 days, PT/OT Sup, SLP min A -tentative discharge date 9-9             -Continue CIR   2.  Antithrombotics: -DVT/anticoagulation:  Pharmaceutical: Heparin             -antiplatelet therapy: aspirin and Plavix   3. Pain Management: Tylenol as needed             -Lidoderm patches to back  -Add gabapentin 100 mg twice daily for peripheral neuropathic pain   4. Mood/Behavior/Sleep: LCSW to evaluate and provide emotional support             -continue Seroquel 50 mg TID             -clonazepam 0.5 mg TID prn             -Geodon 10 mg IM q 12 hours prn             -antipsychotic agents: n/a  -8-27: Agitated by soft restraints in bed, transition to 4 side rails, Posey alarm, and TeleSitter for now.  Veil bed on standby if needed, however feel we should avoid this if possible due to likelihood of agitating patient further   5. Neuropsych/cognition: This patient is not capable of making decisions on his own behalf   -Commend neuropsych consult 6. Skin/Wound Care: Routine skin care checks             -sacral skin off-loading/protection   7. Fluids/Electrolytes/Nutrition: Strict Is and Os and follow-up chemistries per nephrology             -renal diet/ 1200 cc FR  - Renvela 800 mg 3 times daily for hyperphosphatemia added per nephrology 8-27   8: Hypotension: monitor TID and prn             -continue midodrine as needed during dialysis  Normotensive, monitor    03/13/2023    9:09 AM 03/13/2023    6:37 AM 03/13/2023  6:24 AM  Vitals with BMI  Weight  223 lbs 12 oz   BMI  38.39   Systolic 123  109  Diastolic 73  66  Pulse 40  59      9: Hyperlipidemia: continue statin   10: CAD  s/p CABG 2023: on DAPT and statin   11: DM-2: A1c = 6.3% (at home on Lantus 22 units at bedtime, Tradjenta 5 mg daily)        -monitor PO intake             -continue SSI (0-6 units) does not appear to be requiring coverage   Recent Labs    03/12/23 2046 03/13/23 0629 03/13/23 1123  GLUCAP 138* 112* 131*     12: ESRD: HD on Tues, Thurs, Sat -nephrology made aware   13: COPD/OSA; chronic home 2L O2 (Symbicort, Ventolin, prednisone 40 mg daily at home)             -continue symbicort  -Added home oxygen back 8-27   14: GERD: continue Protonix   15: Non-sustained VT/pAF: per cardiology, Dr. Algie Coffer: -continue carvedilol 6.25 mg BID; consider increasing to 12.5 mg BID before starting low dose amiodarone  -consider Eliquis and discontinue asa and Plavix in one week if no active GI bleeding and stable hemoglobin from 8/24 -Continue mag-ox to avoid hypomagnesemia -8-27: Single instance of bradycardia this morning 40s, may have been missed read, monitor through today and do not further adjust beta-blocker medications   16: HFrEF: Daily weights. Monitor for signs of overload Filed Weights   03/12/23 1649 03/13/23 0637  Weight: 103.1 kg 101.5 kg      17: Liver nodularity on CT scan: LFTs reported to be normal. Avoid hepatotoxic medications    18: Encephalopathy             -Continue Seroquel 50mg  TID, Geodon 20mg  IM q12h PRN  -Will review records from [facility], seems duration and severity is out of proportion to current medical issues.  Was unable to tolerate MRI brain due to agitation in the past, we will work on improving sedation so this can be done.   19: Epistaxis             -previously improved with flonase and saline spray   20: Morbid Obesity. Dietary counseling Body mass index is 39.01 kg/m.      LOS: 1 days A FACE TO FACE EVALUATION WAS PERFORMED  [name] [name] 03/13/2023, 9:32 AM

## 2023-03-14 ENCOUNTER — Encounter (HOSPITAL_COMMUNITY): Payer: Medicare Other

## 2023-03-14 ENCOUNTER — Inpatient Hospital Stay (HOSPITAL_COMMUNITY): Payer: Medicare Other

## 2023-03-14 ENCOUNTER — Encounter (HOSPITAL_COMMUNITY): Payer: Self-pay | Admitting: *Deleted

## 2023-03-14 DIAGNOSIS — R569 Unspecified convulsions: Secondary | ICD-10-CM

## 2023-03-14 DIAGNOSIS — Z951 Presence of aortocoronary bypass graft: Secondary | ICD-10-CM

## 2023-03-14 DIAGNOSIS — N250 Renal osteodystrophy: Secondary | ICD-10-CM | POA: Diagnosis not present

## 2023-03-14 DIAGNOSIS — Z992 Dependence on renal dialysis: Secondary | ICD-10-CM | POA: Diagnosis not present

## 2023-03-14 DIAGNOSIS — N186 End stage renal disease: Secondary | ICD-10-CM | POA: Diagnosis not present

## 2023-03-14 DIAGNOSIS — D631 Anemia in chronic kidney disease: Secondary | ICD-10-CM | POA: Diagnosis not present

## 2023-03-14 DIAGNOSIS — R5381 Other malaise: Secondary | ICD-10-CM | POA: Diagnosis not present

## 2023-03-14 DIAGNOSIS — I214 Non-ST elevation (NSTEMI) myocardial infarction: Secondary | ICD-10-CM

## 2023-03-14 DIAGNOSIS — R4182 Altered mental status, unspecified: Secondary | ICD-10-CM | POA: Diagnosis not present

## 2023-03-14 DIAGNOSIS — I120 Hypertensive chronic kidney disease with stage 5 chronic kidney disease or end stage renal disease: Secondary | ICD-10-CM | POA: Diagnosis not present

## 2023-03-14 LAB — GLUCOSE, CAPILLARY
Glucose-Capillary: 160 mg/dL — ABNORMAL HIGH (ref 70–99)
Glucose-Capillary: 118 mg/dL — ABNORMAL HIGH (ref 70–99)
Glucose-Capillary: 130 mg/dL — ABNORMAL HIGH (ref 70–99)

## 2023-03-14 LAB — TROPONIN I (HIGH SENSITIVITY): Troponin I (High Sensitivity): 107 ng/L (ref <18)

## 2023-03-14 LAB — HEPATITIS B SURFACE ANTIBODY, QUANTITATIVE: Hep B S AB Quant (Post): <3.5 m[IU]/mL — ABNORMAL LOW

## 2023-03-14 LAB — LACTIC ACID, PLASMA: Lactic Acid, Venous: 1.4 mmol/L (ref 0.5–1.9)

## 2023-03-14 MED ORDER — DIVALPROEX SODIUM 125 MG PO CSDR
125.0000 mg | DELAYED_RELEASE_CAPSULE | Freq: Three times a day (TID) | ORAL | Status: DC
  Administered 2023-03-14 – 2023-03-18 (×11): 125 mg via ORAL
  Filled 2023-03-14 (×13): qty 1

## 2023-03-14 MED ORDER — HEPARIN SODIUM (PORCINE) 1000 UNIT/ML IJ SOLN
INTRAMUSCULAR | Status: AC
  Administered 2023-03-14: 2000 [IU]
  Filled 2023-03-14: qty 2

## 2023-03-14 MED ORDER — CHLORHEXIDINE GLUCONATE CLOTH 2 % EX PADS
6.0000 | MEDICATED_PAD | Freq: Every day | CUTANEOUS | Status: DC
  Administered 2023-03-15: 6 via TOPICAL

## 2023-03-14 MED ORDER — AMITRIPTYLINE HCL 25 MG PO TABS
50.0000 mg | ORAL_TABLET | Freq: Every day | ORAL | Status: DC
  Administered 2023-03-14 – 2023-03-16 (×3): 50 mg via ORAL
  Filled 2023-03-14 (×3): qty 2

## 2023-03-14 NOTE — Final Progress Note (Signed)
Contacted by rehab CSW yesterday regarding pt's possible d/c date from rehab. Spoke to pt's wife this morning via phone. Wife confirms that pt/family would like pt to receive out-pt HD at [facility] at d/c. Contacted clinic this morning to make staff aware that pt would like to receive in-center HD at clinic upon d/c from rehab. Clinic provided pt's wife's preference of a MWF 2nd shift schedule if possible. Clinic aware of pt's recent agitation and states that pt will need to be improved in this area in order to be appropriate for out-pt HD at d/c from rehab. Clinic to look at possible schedule options with the hopes that pt's agitation will be improved. Will assist as needed.   Olivia Canter Renal Navigator [phone]

## 2023-03-14 NOTE — Progress Notes (Signed)
EEG complete - results pending 

## 2023-03-14 NOTE — Procedures (Signed)
Patient Name: [name] [name]  MRN: [id number]  Epilepsy Attending: [name]  Referring Physician/Provider: [name], MD  Date: 03/14/2023 Duration: 24.27 mins  Patient history: 72yo M with ams getting eeg to evaluate for seizure  Level of alertness: Awake,  AEDs during EEG study: None  Technical aspects: This EEG study was done with scalp electrodes positioned according to the 10-20 International system of electrode placement. Electrical activity was reviewed with band pass filter of 1-70Hz , sensitivity of 7 uV/mm, display speed of 51mm/sec with a 60Hz  notched filter applied as appropriate. EEG data were recorded continuously and digitally stored.  Video monitoring was available and reviewed as appropriate.  Description: The posterior dominant rhythm consists of 8 Hz activity of moderate voltage (25-35 uV) seen predominantly in posterior head regions, symmetric and reactive to eye opening and eye closing. EEG showed intermittent generalized  3 to 6 Hz theta-delta slowing. Hyperventilation and photic stimulation were not performed.     ABNORMALITY - Intermittent slow, generalized  IMPRESSION: This study is suggestive of mild diffuse encephalopathy, nonspecific etiology. No seizures or epileptiform discharges were seen throughout the recording.  [name] Annabelle Harman

## 2023-03-14 NOTE — Progress Notes (Signed)
PROGRESS NOTE   Subjective/Complaints:  Ongoing aggitation overnight, unreponsive to PRN zyprexa x1. LA 2.1 overnight; repeat 1.4 Troponin 109, appears consistent with prior values Otherwise workup unrevealing.   2x BM overnight, continent.   On exam, patient no longer in veil bed, complaining of being restrained and unable to get OOB on his own (currently not in soft restraints or posey belt).   He is calm and cooperative after Geodon this AM, however staff verify he was yelling in his room most of the night. Sleep log not administered, patient endorses sleep for about 4 hours overnight.   C/o ongoing abdominal pain, unchanged.   Perseverative on getting OOB to "the radiator so I can get warm". Oriented to self and place; not unit, time, or reason for hospitalization.   ROS: Per HPI above, c/b encephalopathy  Objective:   No results found. Recent Labs    03/13/23 0816  WBC 4.3  HGB 8.1*  HCT 27.5*  PLT 188   Recent Labs    03/13/23 0816  NA 137  K 4.8  CL 99  CO2 25  GLUCOSE 145*  BUN 48*  CREATININE 11.38*  CALCIUM 8.6*   No intake or output data in the 24 hours ending 03/14/23 0810       Physical Exam: Vital Signs Blood pressure 117/81, pulse 76, temperature 98.1 F (36.7 C), resp. rate 18, height 5\' 4"  (1.626 m), weight 103.4 kg, SpO2 90%.     Assessment/Plan: 1. Functional deficits which require 3+ hours per day of interdisciplinary therapy in a comprehensive inpatient rehab setting. Physiatrist is providing close team supervision and 24 hour management of active medical problems listed below. Physiatrist and rehab team continue to assess barriers to discharge/monitor patient progress toward functional and medical goals  Care Tool:  Bathing    Body parts bathed by patient: Right arm, Left arm, Chest, Abdomen, Front perineal area, Right upper leg, Buttocks, Left upper leg, Right lower leg,  Left lower leg, Face         Bathing assist Assist Level: Total Assistance - Patient < 25% (due to pt refusing to assist)     Upper Body Dressing/Undressing Upper body dressing        Upper body assist Assist Level: Total Assistance - Patient < 25%    Lower Body Dressing/Undressing    PE: Constitution: Mildly lethargic. Appropriate appearance for age. No apparent distress +Obese Resp: No respiratory distress. No accessory muscle usage. on RA and CTAB Cardio: Well perfused appearance. No peripheral edema.  Peripheral pulses 2+, intact, mildly tachycardic. + Left upper extremity fistula Abdomen: Moderately distended. TTP especially LLQ, with fullness/edema around prior PD site.  Hypoactive bowel sounds Psych: Calm, but perseverative on getting OOB; redirectable.   Neurologic Exam:   Awake, alert.  Oriented to self and [name] only; not unit, date, or situation.  + Cognitive delay + Poor insight + Mild L ptosis No Intermittent hallucinations/confabulations on exam today.  Babinsky: flexor responses b/l.   Hoffmans: negative b/l Sensory exam: Responsive to touch in all 4 extremities Motor exam: Antigravity and against resistance in all 4 extremities Coordination: Slight bilateral upper extremity tremor, intention, mild  Musculoskeletal: No joint swelling noted No abnormal tone    Lower body dressing      What is the patient wearing?: Pants     Lower body assist Assist for lower body dressing: Total Assistance - Patient < 25%     Toileting Toileting    Toileting assist Assist for toileting: 2 Helpers     Transfers Chair/bed transfer  Transfers assist     Chair/bed transfer assist level: 2 Helpers (due to behaviors)     Locomotion Ambulation   Ambulation assist      Assist level: 2 helpers Assistive device: Walker-rolling Max distance: 25'   Walk 10 feet activity   Assist     Assist level: 2 helpers Assistive device: Walker-rolling    Walk 50 feet activity   Assist Walk 50 feet with 2 turns activity did not occur: Safety/medical concerns         Walk 150 feet activity   Assist Walk 150 feet activity did not occur: Safety/medical concerns         Walk 10 feet on uneven surface  activity   Assist Walk 10 feet on uneven surfaces activity did not occur: Safety/medical concerns         Wheelchair     Assist Is the patient using a wheelchair?: Yes Type of Wheelchair: Manual    Wheelchair assist level: Dependent - Patient 0%      Wheelchair 50 feet with 2 turns activity    Assist        Assist Level: Dependent - Patient 0%   Wheelchair 150 feet activity     Assist      Assist Level: Dependent - Patient 0%   Blood pressure 117/81, pulse 76, temperature 98.1 F (36.7 C), resp. rate 18, height 5\' 4"  (1.626 m), weight 103.4 kg, SpO2 90%.  Medical Problem List and Plan: 1. Functional deficits secondary to debility due to sepsis secondary to catheter peritonitis from PD catheter with resultant encephalopathy and hypoxemia              -patient may not shower             -ELOS/Goals: 16-18 days, PT/OT Sup, SLP min A -tentative discharge date 9-9             -Continue CIR   - 8/28: Veil bed removed by hospitalist overnight, ongoing intermittent agitation overnight; on tele currently. WOULD NOT add back soft wrist restraints d/t delirium risk, except with dialysis. If any further violence toward staff, would add back veil bed for safety.   2.  Antithrombotics: -DVT/anticoagulation:  Pharmaceutical: Heparin             -antiplatelet therapy: aspirin and Plavix   3. Pain Management: Tylenol as needed             -Lidoderm patches to back  -Elavil 25 mg at bedtime for nerve pain and sleep 8/27   - Overnight addition of PRN lyrica 25 mg TID and PRN oxycodone; consider scheduling oxycodone d/t renal function and minimal PRN use with ongoing abdominal pain    4. Mood/Behavior/Sleep:  LCSW to evaluate and provide emotional support             -continue Seroquel 50 mg TID             -clonazepam 0.5 mg TID prn             -Geodon 10 mg IM q 12 hours prn             -  antipsychotic agents: n/a  -8-27: Agitated by soft restraints in bed, transition to 4 side rails, Posey alarm, and TeleSitter for now.  Veil bed on standby if needed, however feel we should avoid this if possible due to likelihood of agitating patient further   - 8/28: Placed in veil bed; wrist restraints for dialysis. Scheduled Geodon 20 mg twice daily; discontinue Seroquel and clonazepam due to no perceivable benefit. Add as needed Zyprexa 5 mg twice daily p.o. or IM due to recent cardiac arrhythmias and concern for QTc prolongation. As needed ramelteon for sleep. Stop gabapentin due to possible side effect of agitation  - 8/28: No improvement except temporarily with Geodon; add depakote 125 mg q8h for ongoing aggitation, mood stabilization. Increase elavil to 50 mg at bedtime.  Workup ongoing.    5. Neuropsych/cognition: This patient is not capable of making decisions on his own behalf   -recommend neuropsych consult   - 8/27: Ongoing aggitation; Consult hospitalist for further workup on potential contributors to encephalopathy. Pending UA, LA 2.1-> 1.4; otherwise negative so far -Delirium precautions, sleep log, Veiled bed for safety and to minimize restraints  -Once behaviors better under control, agree with reattempting brain MRI (and EEG).  Will also get neurology involved if hospitalist/infectious workup unrevealing.   -Wife has expressed that she is not interested in palliative care at this time; hopefully with above measures patient can get dialysis in the next 48 hours to limit worsening uremic encephalopathy and medical decline    6. Skin/Wound Care: Routine skin care checks             -sacral skin off-loading/protection   7. Fluids/Electrolytes/Nutrition: Strict Is and Os and follow-up chemistries  per nephrology             -renal diet/ 1200 cc FR  - Renvela 800 mg 3 times daily for hyperphosphatemia added per nephrology 8-27   8: Hypotension: monitor TID and prn             -continue midodrine as needed during dialysis  Normotensive, monitor    03/14/2023    5:45 AM 03/14/2023    5:40 AM 03/13/2023    8:17 PM  Vitals with BMI  Weight  227 lbs 15 oz   BMI  39.11   Systolic 117  130  Diastolic 81  66      9: Hyperlipidemia: continue statin   10: CAD s/p CABG 2023: on DAPT and statin.  - troponin elevated but stable, monitor 8/27   11: DM-2: A1c = 6.3% (at home on Lantus 22 units at bedtime, Tradjenta 5 mg daily)        -monitor PO intake             -continue SSI (0-6 units) does not appear to be requiring coverage   Recent Labs    03/13/23 1641 03/13/23 2123 03/14/23 0710  GLUCAP 121* 125* 160*     12: ESRD: HD on Tues, Thurs, Sat -nephrology made aware   13: COPD/OSA; chronic home 2L O2 (Symbicort, Ventolin, prednisone 40 mg daily at home)             -continue symbicort  -Added home oxygen back 8-27   14: GERD: continue Protonix   15: Non-sustained VT/pAF: per cardiology, Dr. Algie Coffer: -continue carvedilol 6.25 mg BID; consider increasing to 12.5 mg BID before starting low dose amiodarone  -consider Eliquis and discontinue asa and Plavix in one week if no active GI bleeding and stable hemoglobin from  8/24 -Continue mag-ox to avoid hypomagnesemia -8-27: Single instance of bradycardia this morning 40s, may have been missed read, monitor through today and do not further adjust beta-blocker medications  - 8/28: vitals stable   16: HFrEF: Daily weights. Monitor for signs of overload Filed Weights   03/13/23 1610 03/13/23 1349 03/14/23 0540  Weight: 101.5 kg 104.3 kg 103.4 kg      17: Liver nodularity on CT scan: LFTs reported to be normal. Avoid hepatotoxic medications    18: Encephalopathy             -Continue Seroquel 50mg  TID, Geodon 20mg  IM q12h  PRN  -Will review records from [facility] -- has worsened since approximately 8/22, possible 8/19, consistent with records from nursing [facility] with current behavior.    - see above for medication adjustments, workup. MRI brain pending   19: Epistaxis - resolved             -previously improved with flonase and saline spray   20: Morbid Obesity. Dietary counseling Body mass index is 39.01 kg/m.      LOS: 2 days A FACE TO FACE EVALUATION WAS PERFORMED  [name] [name] 03/14/2023, 8:10 AM

## 2023-03-14 NOTE — Progress Notes (Signed)
Cardiac Individual Treatment Plan  Patient Details  Name: [name] [name] MRN: [id number] Date of Birth: 03-27-1951 Referring Provider:   Flowsheet Row CARDIAC REHAB PHASE II EXERCISE from 01/08/2023 in [facility] CARDIAC REHABILITATION  Referring Provider Dr. Rennis Golden       Initial Encounter Date:  Flowsheet Row CARDIAC REHAB PHASE II EXERCISE from 01/08/2023 in [place] CARDIAC REHABILITATION  Date 01/08/23       Visit Diagnosis: S/P CABG x 2  NSTEMI (non-ST elevated myocardial infarction) [facility])  Patient's Home Medications on Admission: No current facility-administered medications for this visit. No current outpatient medications on file.  Facility-Administered Medications Ordered in Other Visits:    (feeding supplement) PROSource Plus liquid 30 mL, 30 mL, Oral, BID BM, Stovall, Wille Celeste, PA-C   acetaminophen (TYLENOL) tablet 325-650 mg, 325-650 mg, Oral, Q4H PRN, Milinda Antis, PA-C, 650 mg at 03/13/23 1004   acidophilus (RISAQUAD) capsule 1 capsule, 1 capsule, Oral, Daily, Setzer, Lynnell Jude, PA-C, 1 capsule at 03/13/23 0806   amitriptyline (ELAVIL) tablet 50 mg, 50 mg, Oral, QHS, Engler, Morgan C, DO   aspirin EC tablet 81 mg, 81 mg, Oral, Daily, Milinda Antis, PA-C, 81 mg at 03/13/23 7253   atorvastatin (LIPITOR) tablet 80 mg, 80 mg, Oral, Daily, Milinda Antis, PA-C, 80 mg at 03/13/23 6644   bisacodyl (DULCOLAX) EC tablet 5 mg, 5 mg, Oral, Daily PRN, Valetta Fuller, Sandra J, PA-C   carvedilol (COREG) tablet 6.25 mg, 6.25 mg, Oral, BID WC, Setzer, Sandra J, PA-C, 6.25 mg at 03/13/23 1740   Chlorhexidine Gluconate Cloth 2 % PADS 6 each, 6 each, Topical, Q0600, Estanislado Emms, MD, 6 each at 03/14/23 [phone]   clopidogrel (PLAVIX) tablet 75 mg, 75 mg, Oral, Daily, Milinda Antis, PA-C, 75 mg at 03/13/23 0806   [START ON 03/16/2023] Darbepoetin Alfa (ARANESP) injection 150 mcg, 150 mcg, Subcutaneous, Q Fri-1800, Stovall, Kathryn R, PA-C   divalproex (DEPAKOTE SPRINKLE) capsule 125  mg, 125 mg, Oral, Q8H, Engler, Morgan C, DO   feeding supplement (ENSURE ENLIVE / ENSURE PLUS) liquid 237 mL, 237 mL, Oral, BID WC, Setzer, Lynnell Jude, PA-C, 237 mL at 03/13/23 4259   Gerhardt's butt cream 1 Application, 1 Application, Topical, QID, Setzer, Lynnell Jude, PA-C, 1 Application at 03/13/23 2116   guaiFENesin-dextromethorphan (ROBITUSSIN DM) 100-10 MG/5ML syrup 10 mL, 10 mL, Oral, Q6H PRN, Setzer, Lynnell Jude, PA-C   heparin injection 5,000 Units, 5,000 Units, Subcutaneous, Q8H, Setzer, Lynnell Jude, PA-C, 5,000 Units at 03/14/23 0537   insulin aspart (novoLOG) injection 0-6 Units, 0-6 Units, Subcutaneous, TID WC, Engler, Morgan C, DO   lidocaine (LIDODERM) 5 % 1 patch, 1 patch, Transdermal, Q2200, Setzer, Lynnell Jude, PA-C   lidocaine (LIDODERM) 5 % 1 patch, 1 patch, Transdermal, Q24H, Skip Mayer A, MD   magnesium oxide (MAG-OX) tablet 400 mg, 400 mg, Oral, Daily, Setzer, Lynnell Jude, PA-C, 400 mg at 03/13/23 5638   midodrine (PROAMATINE) tablet 10 mg, 10 mg, Oral, PRN, Setzer, Lynnell Jude, PA-C   mometasone-formoterol (DULERA) 200-5 MCG/ACT inhaler 2 puff, 2 puff, Inhalation, BID, Setzer, Lynnell Jude, PA-C, 2 puff at 03/12/23 2027   multivitamin (RENA-VIT) tablet 1 tablet, 1 tablet, Oral, QHS, Setzer, Lynnell Jude, PA-C, 1 tablet at 03/13/23 2114   OLANZapine zydis (ZYPREXA) disintegrating tablet 5 mg, 5 mg, Oral, BID PRN **OR** OLANZapine (ZYPREXA) injection 5 mg, 5 mg, Intramuscular, BID PRN, Shearon Stalls, Morgan C, DO, 5 mg at 03/14/23 0359   ondansetron (ZOFRAN) tablet 4 mg, 4 mg,  Oral, Q6H PRN **OR** ondansetron (ZOFRAN) injection 4 mg, 4 mg, Intravenous, Q6H PRN, Setzer, Lynnell Jude, PA-C   oxyCODONE (Oxy IR/ROXICODONE) immediate release tablet 2.5 mg, 2.5 mg, Oral, Q6H PRN, [name], MD   pantoprazole (PROTONIX) EC tablet 40 mg, 40 mg, Oral, BID, Setzer, Lynnell Jude, PA-C, 40 mg at 03/13/23 2114   pregabalin (LYRICA) capsule 25 mg, 25 mg, Oral, TID PRN, [name], MD   ramelteon (ROZEREM)  tablet 8 mg, 8 mg, Oral, QHS PRN, Angelina Sheriff, DO, 8 mg at 03/13/23 2115   sevelamer carbonate (RENVELA) tablet 800 mg, 800 mg, Oral, TID WC, Julien Nordmann, PA-C, 800 mg at 03/13/23 1740   ziprasidone (GEODON) capsule 20 mg, 20 mg, Oral, BID WC, Engler, Morgan C, DO, 20 mg at 03/13/23 1740  Past Medical History: Past Medical History:  Diagnosis Date   Anemia    Arthritis    Asthma    Bell palsy    CAD (coronary artery disease)    a. 2014 MV: abnl w/ infap ischemia; b. 03/2013 Cath: aneurysmal bleb in the LAD w/ otw nonobs dzs-->Med Rx.   Chronic back pain    Chronic knee pain    a. 09/2015 s/p R TKA.   Chronic pain    Chronic shoulder pain    Chronic sinusitis    COPD (chronic obstructive pulmonary disease) (HCC)    Diabetes mellitus without complication (HCC)    type II    ESRD on peritoneal dialysis (HCC)    on peritoneal dialysis, [facility]   Essential hypertension    GERD (gastroesophageal reflux disease)    Gout    Gout    Hepatomegaly    noted on noncontrast CT 2015   History of hiatal hernia    Hyperlipidemia    Lateral meniscus tear    Obesity    Truncal   Obstructive sleep apnea    does not use cpap    On home oxygen therapy    uses 2l when is going somewhere per patient    PUD (peptic ulcer disease)    remote, reports f/u EGD about 8 years ago unremarkable    Reactive airway disease    related to exposure to chemical during 9/11   Sinusitis    Vitamin D deficiency     Tobacco Use: Social History   Tobacco Use  Smoking Status Former   Current packs/day: 0.00   Average packs/day: 1 pack/day for 25.0 years (25.0 ttl pk-yrs)   Types: Cigarettes   Start date: 03/27/1985   Quit date: 03/27/2010   Years since quitting: 12.9  Smokeless Tobacco Never  Tobacco Comments   Quit x 7 years    Labs: Review Flowsheet  More data exists      Latest Ref Rng & Units 02/20/2023 02/21/2023 02/24/2023 03/04/2023 03/13/2023  Labs for ITP Cardiac and  Pulmonary Rehab  Hemoglobin A1c 4.8 - 5.6 % - - - 6.3  -  PH, Arterial 7.35 - 7.45 7.430  7.48  7.431  - -  PCO2 arterial 32 - 48 mmHg 35.7  35  44.3  - -  Bicarbonate 20.0 - 28.0 mmol/L 23.7  25.9  29.5  - 30.4   TCO2 22 - 32 mmol/L 25  - 31  - -  O2 Saturation % 92  100  99  - 41.1     Details            Capillary Blood Glucose: Lab Results  Component Value Date   GLUCAP 160 (H) 03/14/2023   GLUCAP 125 (H) 03/13/2023   GLUCAP 121 (H) 03/13/2023   GLUCAP 131 (H) 03/13/2023   GLUCAP 112 (H) 03/13/2023     Exercise Target Goals: Exercise Program Goal: Individual exercise prescription set using results from initial 6 min walk test and THRR while considering  patient's activity barriers and safety.   Exercise Prescription Goal: Starting with aerobic activity 30 plus minutes a day, 3 days per week for initial exercise prescription. Provide home exercise prescription and guidelines that participant acknowledges understanding prior to discharge.  Activity Barriers & Risk Stratification:  Activity Barriers & Cardiac Risk Stratification - 01/03/23 1027       Activity Barriers & Cardiac Risk Stratification   Activity Barriers Arthritis;Back Problems;Neck/Spine Problems;Joint Problems;Deconditioning;Shortness of Breath;Balance Concerns;Assistive Device    Cardiac Risk Stratification High             6 Minute Walk:  6 Minute Walk     Row Name 01/08/23 1200         6 Minute Walk   Phase Initial     Distance 1000 feet     Walk Time 5 minutes     # of Rest Breaks 0     MPH 1.89     METS 1.84     RPE 12     VO2 Peak 6.45     Symptoms No     Resting HR 66 bpm     Resting BP 120/72     Resting Oxygen Saturation  96 %     Exercise Oxygen Saturation  during 6 min walk 96 %     Max Ex. HR 99 bpm     Max Ex. BP 140/76     2 Minute Post BP 126/76              Oxygen Initial Assessment:   Oxygen Re-Evaluation:   Oxygen Discharge (Final Oxygen  Re-Evaluation):   Initial Exercise Prescription:  Initial Exercise Prescription - 01/08/23 1200       Date of Initial Exercise RX and Referring Provider   Date 01/08/23    Referring Provider Dr. Rennis Golden    Expected Discharge Date 03/30/23      Treadmill   MPH 1.5    Grade 0    Minutes 17      NuStep   Level 1    SPM 80    Minutes 22      Prescription Details   Frequency (times per week) 3    Duration Progress to 30 minutes of continuous aerobic without signs/symptoms of physical distress      Intensity   THRR 40-80% of Max Heartrate 59-118    Ratings of Perceived Exertion 11-13    Perceived Dyspnea 0-4      Resistance Training   Training Prescription Yes    Weight 3    Reps 10-15             Perform Capillary Blood Glucose checks as needed.  Exercise Prescription Changes:   Exercise Prescription Changes     Row Name 01/17/23 1300 01/26/23 1100 01/29/23 1200         Response to Exercise   Blood Pressure (Admit) 130/64 -- 126/62     Blood Pressure (Exercise) 140/64 -- 152/84     Blood Pressure (Exit) 136/62 -- 120/70     Heart Rate (Admit) 67 bpm -- 71 bpm     Heart Rate (Exercise) 79  bpm -- 91 bpm     Heart Rate (Exit) 82 bpm -- 70 bpm     Rating of Perceived Exertion (Exercise) 12 -- 12     Duration Continue with 30 min of aerobic exercise without signs/symptoms of physical distress. -- Continue with 30 min of aerobic exercise without signs/symptoms of physical distress.     Intensity THRR unchanged -- THRR unchanged       Progression   Progression Continue to progress workloads to maintain intensity without signs/symptoms of physical distress. -- Continue to progress workloads to maintain intensity without signs/symptoms of physical distress.       Resistance Training   Training Prescription Yes -- Yes     Weight 2 -- 2     Reps 10-15 -- 10-15     Time 10 Minutes -- --       Treadmill   MPH 1.5 -- 1.5     Grade 0 -- 0     Minutes 17 -- 15      METs 2.15 -- 2.15       NuStep   Level 2 -- 2     SPM 63 -- 100     Minutes 22 -- 15     METs 1.9 -- 2.2       Home Exercise Plan   Plans to continue exercise at -- Home (comment)  walking, seated elliptical --     Frequency -- Add 2 additional days to program exercise sessions. --     Initial Home Exercises Provided -- 01/26/23 --       Oxygen   Maintain Oxygen Saturation -- -- 88% or higher              Exercise Comments:   Exercise Goals and Review:   Exercise Goals     Row Name 01/09/23 0746 01/30/23 0840           Exercise Goals   Increase Physical Activity Yes Yes      Intervention Provide advice, education, support and counseling about physical activity/exercise needs.;Develop an individualized exercise prescription for aerobic and resistive training based on initial evaluation findings, risk stratification, comorbidities and participant's personal goals. Provide advice, education, support and counseling about physical activity/exercise needs.;Develop an individualized exercise prescription for aerobic and resistive training based on initial evaluation findings, risk stratification, comorbidities and participant's personal goals.      Expected Outcomes Short Term: Attend rehab on a regular basis to increase amount of physical activity.;Long Term: Add in home exercise to make exercise part of routine and to increase amount of physical activity.;Long Term: Exercising regularly at least 3-5 days a week. Short Term: Attend rehab on a regular basis to increase amount of physical activity.;Long Term: Add in home exercise to make exercise part of routine and to increase amount of physical activity.;Long Term: Exercising regularly at least 3-5 days a week.      Increase Strength and Stamina Yes Yes      Intervention Provide advice, education, support and counseling about physical activity/exercise needs.;Develop an individualized exercise prescription for aerobic and resistive  training based on initial evaluation findings, risk stratification, comorbidities and participant's personal goals. Provide advice, education, support and counseling about physical activity/exercise needs.;Develop an individualized exercise prescription for aerobic and resistive training based on initial evaluation findings, risk stratification, comorbidities and participant's personal goals.      Expected Outcomes Short Term: Increase workloads from initial exercise prescription for resistance, speed, and METs.;Short Term: Perform resistance training  exercises routinely during rehab and add in resistance training at home;Long Term: Improve cardiorespiratory fitness, muscular endurance and strength as measured by increased METs and functional capacity ( ) Short Term: Increase workloads from initial exercise prescription for resistance, speed, and METs.;Short Term: Perform resistance training exercises routinely during rehab and add in resistance training at home;Long Term: Improve cardiorespiratory fitness, muscular endurance and strength as measured by increased METs and functional capacity ( )      Able to understand and use rate of perceived exertion (RPE) scale Yes Yes      Intervention Provide education and explanation on how to use RPE scale Provide education and explanation on how to use RPE scale      Expected Outcomes Short Term: Able to use RPE daily in rehab to express subjective intensity level;Long Term:  Able to use RPE to guide intensity level when exercising independently Short Term: Able to use RPE daily in rehab to express subjective intensity level;Long Term:  Able to use RPE to guide intensity level when exercising independently      Knowledge and understanding of Target Heart Rate Range (THRR) Yes Yes      Intervention Provide education and explanation of THRR including how the numbers were predicted and where they are located for reference Provide education and explanation of THRR  including how the numbers were predicted and where they are located for reference      Expected Outcomes Short Term: Able to state/look up THRR;Long Term: Able to use THRR to govern intensity when exercising independently;Short Term: Able to use daily as guideline for intensity in rehab Short Term: Able to state/look up THRR;Long Term: Able to use THRR to govern intensity when exercising independently;Short Term: Able to use daily as guideline for intensity in rehab      Able to check pulse independently Yes Yes      Intervention Provide education and demonstration on how to check pulse in carotid and radial arteries.;Review the importance of being able to check your own pulse for safety during independent exercise Provide education and demonstration on how to check pulse in carotid and radial arteries.;Review the importance of being able to check your own pulse for safety during independent exercise      Expected Outcomes Short Term: Able to explain why pulse checking is important during independent exercise;Long Term: Able to check pulse independently and accurately Short Term: Able to explain why pulse checking is important during independent exercise;Long Term: Able to check pulse independently and accurately      Understanding of Exercise Prescription Yes Yes      Intervention Provide education, explanation, and written materials on patient's individual exercise prescription Provide education, explanation, and written materials on patient's individual exercise prescription      Expected Outcomes Short Term: Able to explain program exercise prescription;Long Term: Able to explain home exercise prescription to exercise independently Short Term: Able to explain program exercise prescription;Long Term: Able to explain home exercise prescription to exercise independently               Exercise Goals Re-Evaluation :  Exercise Goals Re-Evaluation     Row Name 01/26/23 1135 01/30/23 0840            Exercise Goal Re-Evaluation   Exercise Goals Review Increase Physical Activity;Increase Strength and Stamina;Able to understand and use rate of perceived exertion (RPE) scale;Able to understand and use Dyspnea scale;Knowledge and understanding of Target Heart Rate Range (THRR);Able to check pulse independently;Understanding of Exercise Prescription Increase  Physical Activity;Increase Strength and Stamina;Able to understand and use rate of perceived exertion (RPE) scale;Knowledge and understanding of Target Heart Rate Range (THRR);Able to check pulse independently;Understanding of Exercise Prescription      Comments Reviewed home exercise with pt today.  Pt plans to walk and use seated ellipitcal at home for exercise.  Reviewed THR, pulse, RPE, sign and symptoms, pulse oximetery and when to call 911 or MD.  Also discussed weather considerations and indoor options.  Pt voiced understanding. Pt has completed 8 sessions of cardiac rehab. He is currently exercising at 2.2 METs on the stepper. He is planning on walking and using seated ellipitcal at home to exercise on days that he is not in class. Will continue to monitor and progress as able.      Expected Outcomes Short: Start to add in exercise at home on off days Long: Continue to exercise indpedently Through exercise at rehab and home, patient will acheive their goals.                Discharge Exercise Prescription (Final Exercise Prescription Changes):  Exercise Prescription Changes - 01/29/23 1200       Response to Exercise   Blood Pressure (Admit) 126/62    Blood Pressure (Exercise) 152/84    Blood Pressure (Exit) 120/70    Heart Rate (Admit) 71 bpm    Heart Rate (Exercise) 91 bpm    Heart Rate (Exit) 70 bpm    Rating of Perceived Exertion (Exercise) 12    Duration Continue with 30 min of aerobic exercise without signs/symptoms of physical distress.    Intensity THRR unchanged      Progression   Progression Continue to progress  workloads to maintain intensity without signs/symptoms of physical distress.      Resistance Training   Training Prescription Yes    Weight 2    Reps 10-15      Treadmill   MPH 1.5    Grade 0    Minutes 15    METs 2.15      NuStep   Level 2    SPM 100    Minutes 15    METs 2.2      Oxygen   Maintain Oxygen Saturation 88% or higher             Nutrition:  Target Goals: Understanding of nutrition guidelines, daily intake of sodium 1500mg , cholesterol 200mg , calories 30% from fat and 7% or less from saturated fats, daily to have 5 or more servings of fruits and vegetables.  Biometrics:  Pre Biometrics - 01/09/23 0747       Pre Biometrics   Height 5\' 6"  (1.676 m)    Weight 221 lb 5.5 oz (100.4 kg)    Waist Circumference 47 inches    Hip Circumference 45 inches    Waist to Hip Ratio 1.04 %    BMI (Calculated) 35.74    Triceps Skinfold 25 mm    % Body Fat 26.4 %    Grip Strength 34.1 kg    Flexibility 0 in    Single Leg Stand 0 seconds              Nutrition Therapy Plan and Nutrition Goals:  Nutrition Therapy & Goals - 01/22/23 1042       Nutrition Therapy   RD appointment deferred Yes      Personal Nutrition Goals   Comments We provide educational sessions on heart healthy nutrition with handouts during the program.  Intervention Plan   Intervention Nutrition handout(s) given to patient.    Expected Outcomes Short Term Goal: Understand basic principles of dietary content, such as calories, fat, sodium, cholesterol and nutrients.             Nutrition Assessments:  Nutrition Assessments - 01/03/23 1040       MEDFICTS Scores   Pre Score 26            MEDIFICTS Score Key: ?70 Need to make dietary changes  40-70 Heart Healthy Diet ? 40 Therapeutic Level Cholesterol Diet   Picture Your Plate Scores: <47 Unhealthy dietary pattern with much room for improvement. 41-50 Dietary pattern unlikely to meet recommendations for good  health and room for improvement. 51-60 More healthful dietary pattern, with some room for improvement.  >60 Healthy dietary pattern, although there may be some specific behaviors that could be improved.    Nutrition Goals Re-Evaluation:   Nutrition Goals Discharge (Final Nutrition Goals Re-Evaluation):   Psychosocial: Target Goals: Acknowledge presence or absence of significant depression and/or stress, maximize coping skills, provide positive support system. Participant is able to verbalize types and ability to use techniques and skills needed for reducing stress and depression.  Initial Review & Psychosocial Screening:  Initial Psych Review & Screening - 01/03/23 1034       Initial Review   Current issues with Current Sleep Concerns      Family Dynamics   Good Support System? Yes    Comments The pt's wife and his 2 daughters are his support system.      Barriers   Psychosocial barriers to participate in program There are no identifiable barriers or psychosocial needs.      Screening Interventions   Interventions Encouraged to exercise;To provide support and resources with identified psychosocial needs;Provide feedback about the scores to participant    Expected Outcomes Short Term goal: Identification and review with participant of any Quality of Life or Depression concerns found by scoring the questionnaire.;Long Term goal: The participant improves quality of Life and PHQ9 Scores as seen by post scores and/or verbalization of changes             Quality of Life Scores:  Scores of 19 and below usually indicate a poorer quality of life in these areas.  A difference of  2-3 points is a clinically meaningful difference.  A difference of 2-3 points in the total score of the Quality of Life Index has been associated with significant improvement in overall quality of life, self-image, physical symptoms, and general health in studies assessing change in quality of  life.  PHQ-9: Review Flowsheet  More data exists      01/03/2023 08/08/2022 02/01/2022 12/29/2021 09/29/2021  Depression screen PHQ 2/9  Decreased Interest 3 0 0 0 0  Down, Depressed, Hopeless 0 1 0 1 0  PHQ - 2 Score 3 1 0 1 0  Altered sleeping 3 0 - - 0  Tired, decreased energy 3 3 - - 3  Change in appetite 2 2 - - 1  Feeling bad or failure about yourself  0 0 - - 2  Trouble concentrating 0 0 - - 1  Moving slowly or fidgety/restless 0 0 - - 0  Suicidal thoughts 0 0 - - 0  PHQ-9 Score 11 6 - - 7  Difficult doing work/chores Somewhat difficult Somewhat difficult - - Somewhat difficult    Details           Interpretation of Total Score  Total Score Depression Severity:  1-4 = Minimal depression, 5-9 = Mild depression, 10-14 = Moderate depression, 15-19 = Moderately severe depression, 20-27 = Severe depression   Psychosocial Evaluation and Intervention:  Psychosocial Evaluation - 01/03/23 1132       Psychosocial Evaluation & Interventions   Interventions Stress management education;Relaxation education;Encouraged to exercise with the program and follow exercise prescription    Comments Pt has no identifiable psychosocial barriers to completing the CR program.  He scored an 11 on his PHQ-9 related to his fatigue, trouble staying asleep, and having little interest in doing things (because of his fatigue).  He denies having any anxiety/depression, and he does not want to be placed on any sleep aids.  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  The pt states he has had a poor appetite since his CABG which is potentially contributing to his fatigue, but he did not want to be referred to a dietician.  He continues to undergo peritoneal dialysis at home.  The pt has a good support system with his wife and 2 daughters.  His goal for the program is to improve his overall health, and we will continue to monitor his progress as he works  toward meeting this goal.    Expected Outcomes Pt will continue to have no identifiable psychosocial barriers.    Continue Psychosocial Services  No Follow up required             Psychosocial Re-Evaluation:  Psychosocial Re-Evaluation     Row Name 01/22/23 1043             Psychosocial Re-Evaluation   Current issues with None Identified       Comments Patient is new to the program. He has completed 6 sessions. He continues to have no psychosocial barriers identified. He seems to enjoy the program and demonstrates an interest in improving his health. We will continue to montior his progress.       Expected Outcomes Patient will continue to have no psychosocial barriers or issues identified.       Interventions Encouraged to attend Cardiac Rehabilitation for the exercise;Stress management education;Relaxation education       Continue Psychosocial Services  No Follow up required                Psychosocial Discharge (Final Psychosocial Re-Evaluation):  Psychosocial Re-Evaluation - 01/22/23 1043       Psychosocial Re-Evaluation   Current issues with None Identified    Comments Patient is new to the program. He has completed 6 sessions. He continues to have no psychosocial barriers identified. He seems to enjoy the program and demonstrates an interest in improving his health. We will continue to montior his progress.    Expected Outcomes Patient will continue to have no psychosocial barriers or issues identified.    Interventions Encouraged to attend Cardiac Rehabilitation for the exercise;Stress management education;Relaxation education    Continue Psychosocial Services  No Follow up required             Vocational Rehabilitation: Provide vocational rehab assistance to qualifying candidates.   Vocational Rehab Evaluation & Intervention:  Vocational Rehab - 01/03/23 1048       Initial Vocational Rehab Evaluation & Intervention   Assessment shows need for  Vocational Rehabilitation Yes      Vocational Rehab Re-Evaulation   Comments Pt is interested in talking to a counselor about to discuss  vocational rehab services.             Education: Education Goals: Education classes will be provided on a weekly basis, covering required topics. Participant will state understanding/return demonstration of topics presented.  Learning Barriers/Preferences:  Learning Barriers/Preferences - 01/03/23 1043       Learning Barriers/Preferences   Learning Barriers None    Learning Preferences Verbal Instruction;Written Material             Education Topics: Hypertension, Hypertension Reduction -Define heart disease and high blood pressure. Discus how high blood pressure affects the body and ways to reduce high blood pressure. Flowsheet Row CARDIAC REHAB PHASE II EXERCISE from 02/07/2023 in [place] CARDIAC REHABILITATION  Date 01/17/23  Educator HB  Instruction Review Code 1- Verbalizes Understanding       Exercise and Your Heart -Discuss why it is important to exercise, the FITT principles of exercise, normal and abnormal responses to exercise, and how to exercise safely. Flowsheet Row CARDIAC REHAB PHASE II EXERCISE from 11/30/2021 in [place] CARDIAC REHABILITATION  Date 10/26/21  Educator HJ  Instruction Review Code 1- Verbalizes Understanding       Angina -Discuss definition of angina, causes of angina, treatment of angina, and how to decrease risk of having angina. Flowsheet Row CARDIAC REHAB PHASE II EXERCISE from 02/07/2023 in [place] CARDIAC REHABILITATION  Date 01/31/23  Educator HW  Instruction Review Code 1- Verbalizes Understanding       Cardiac Medications -Review what the following cardiac medications are used for, how they affect the body, and side effects that may occur when taking the medications.  Medications include Aspirin, Beta blockers, calcium channel blockers, ACE Inhibitors, angiotensin receptor  blockers, diuretics, digoxin, and antihyperlipidemics. Flowsheet Row CARDIAC REHAB PHASE II EXERCISE from 02/07/2023 in [place] CARDIAC REHABILITATION  Date 02/07/23  Educator HB  Instruction Review Code 1- Verbalizes Understanding       Congestive Heart Failure -Discuss the definition of CHF, how to live with CHF, the signs and symptoms of CHF, and how keep track of weight and sodium intake. Flowsheet Row CARDIAC REHAB PHASE II EXERCISE from 11/30/2021 in [place] CARDIAC REHABILITATION  Date 11/16/21  Educator hj  Instruction Review Code 2- Demonstrated Understanding       Heart Disease and Intimacy -Discus the effect sexual activity has on the heart, how changes occur during intimacy as we age, and safety during sexual activity. Flowsheet Row CARDIAC REHAB PHASE II EXERCISE from 11/30/2021 in [place] CARDIAC REHABILITATION  Date 11/23/21  Educator pb  Instruction Review Code 1- Verbalizes Understanding       Smoking Cessation / COPD -Discuss different methods to quit smoking, the health benefits of quitting smoking, and the definition of COPD. Flowsheet Row CARDIAC REHAB PHASE II EXERCISE from 09/13/2022 in [place] CARDIAC REHABILITATION  Date 09/06/22  Educator HB  Instruction Review Code 1- Verbalizes Understanding       Nutrition I: Fats -Discuss the types of cholesterol, what cholesterol does to the heart, and how cholesterol levels can be controlled. Flowsheet Row CARDIAC REHAB PHASE II EXERCISE from 09/13/2022 in [place] CARDIAC REHABILITATION  Date 09/13/22  Educator HB  Instruction Review Code 2- Demonstrated Understanding       Nutrition II: Labels -Discuss the different components of food labels and how to read food label   Heart Parts/Heart Disease and PAD -Discuss the anatomy of the heart, the pathway of blood circulation through the heart, and these are affected by  heart disease.   Stress I: Signs and Symptoms -Discuss the causes of  stress, how stress may lead to anxiety and depression, and ways to limit stress.   Stress II: Relaxation -Discuss different types of relaxation techniques to limit stress. Flowsheet Row CARDIAC REHAB PHASE II EXERCISE from 10/05/2021 in [place] CARDIAC REHABILITATION  Date 10/05/21  Educator pb  Instruction Review Code 1- Verbalizes Understanding       Warning Signs of Stroke / TIA -Discuss definition of a stroke, what the signs and symptoms are of a stroke, and how to identify when someone is having stroke. Flowsheet Row CARDIAC REHAB PHASE II EXERCISE from 01/10/2023 in [place] CARDIAC REHABILITATION  Date 01/10/23  Educator HB       Knowledge Questionnaire Score:  Knowledge Questionnaire Score - 01/03/23 1044       Knowledge Questionnaire Score   Pre Score 20/24             Core Components/Risk Factors/Patient Goals at Admission:  Personal Goals and Risk Factors at Admission - 01/03/23 1051       Core Components/Risk Factors/Patient Goals on Admission    Weight Management Yes;Obesity    Intervention Obesity: Provide education and appropriate resources to help participant work on and attain dietary goals.    Expected Outcomes Short Term: Continue to assess and modify interventions until short term weight is achieved;Long Term: Adherence to nutrition and physical activity/exercise program aimed toward attainment of established weight goal;Weight Loss: Understanding of general recommendations for a balanced deficit meal plan, which promotes 1-2 lb weight loss per week and includes a negative energy balance of [phone] kcal/d;Understanding of distribution of calorie intake throughout the day with the consumption of 4-5 meals/snacks;Understanding recommendations for meals to include 15-35% energy as protein, 25-35% energy from fat, 35-60% energy from carbohydrates, less than 200mg  of dietary cholesterol, 20-35 gm of total fiber daily    Improve shortness of breath with  ADL's Yes    Intervention Provide education, individualized exercise plan and daily activity instruction to help decrease symptoms of SOB with activities of daily living.    Expected Outcomes Short Term: Improve cardiorespiratory fitness to achieve a reduction of symptoms when performing ADLs;Long Term: Be able to perform more ADLs without symptoms or delay the onset of symptoms    Diabetes Yes    Intervention Provide education about signs/symptoms and action to take for hypo/hyperglycemia.;Provide education about proper nutrition, including hydration, and aerobic/resistive exercise prescription along with prescribed medications to achieve blood glucose in normal ranges: Fasting glucose 65-99 mg/dL    Expected Outcomes Short Term: Participant verbalizes understanding of the signs/symptoms and immediate care of hyper/hypoglycemia, proper foot care and importance of medication, aerobic/resistive exercise and nutrition plan for blood glucose control.;Long Term: Attainment of HbA1C < 7%.    Hypertension Yes    Intervention Provide education on lifestyle modifcations including regular physical activity/exercise, weight management, moderate sodium restriction and increased consumption of fresh fruit, vegetables, and low fat dairy, alcohol moderation, and smoking cessation.;Monitor prescription use compliance.    Expected Outcomes Short Term: Continued assessment and intervention until BP is < 140/59mm HG in hypertensive participants. < 130/110mm HG in hypertensive participants with diabetes, heart failure or chronic kidney disease.;Long Term: Maintenance of blood pressure at goal levels.    Lipids Yes    Intervention Provide education and support for participant on nutrition & aerobic/resistive exercise along with prescribed medications to achieve LDL 70mg , HDL >40mg .    Expected Outcomes Short Term: Participant  states understanding of desired cholesterol values and is compliant with medications prescribed.  Participant is following exercise prescription and nutrition guidelines.;Long Term: Cholesterol controlled with medications as prescribed, with individualized exercise RX and with personalized nutrition plan. Value goals: LDL < 70mg , HDL > 40 mg.    Personal Goal Other Yes    Personal Goal Pt wants to improve his overall health.    Intervention Pt will attend CR program 3 times a week and also start a home exercise program.    Expected Outcomes Pt will complete the program meeting both his personal and program goals.             Core Components/Risk Factors/Patient Goals Review:   Goals and Risk Factor Review     Row Name 01/22/23 1044             Core Components/Risk Factors/Patient Goals Review   Personal Goals Review Weight Management/Obesity;Diabetes;Improve shortness of breath with ADL's;Other;Hypertension;Lipids       Review Patient was referred to CR with [facility]. He has multiple risk factors for CAD and is participating in the program for risk modification. He has completed 6 sessions. His current weight is 225.4 lbs maintained from his orientation visit. Patient has participated in the program twice. His blood pressure is labile. He is being evaluated by PT for chronic right shoulder pain. His last A1C was 5.9% 05/30/22. His DM is managed with Insulin. His personal goals for the program is to improve his overall health. We will continue to montior his progress as he works towards meeting these goals.       Expected Outcomes Patient will complete the program meeting both program and personal goals.                Core Components/Risk Factors/Patient Goals at Discharge (Final Review):   Goals and Risk Factor Review - 01/22/23 1044       Core Components/Risk Factors/Patient Goals Review   Personal Goals Review Weight Management/Obesity;Diabetes;Improve shortness of breath with ADL's;Other;Hypertension;Lipids    Review Patient was referred to CR with [facility]. He has multiple  risk factors for CAD and is participating in the program for risk modification. He has completed 6 sessions. His current weight is 225.4 lbs maintained from his orientation visit. Patient has participated in the program twice. His blood pressure is labile. He is being evaluated by PT for chronic right shoulder pain. His last A1C was 5.9% 05/30/22. His DM is managed with Insulin. His personal goals for the program is to improve his overall health. We will continue to montior his progress as he works towards meeting these goals.    Expected Outcomes Patient will complete the program meeting both program and personal goals.             ITP Comments:  ITP Comments     Row Name 01/31/23 1305 02/14/23 0738 02/21/23 0746 02/28/23 0816 03/14/23 0836   ITP Comments 30 day review completed. ITP sent to Dr. Dina Rich, Medical Director of Cardiac Rehab. Continue with ITP unless changes are made by physician. Pt has tested positive for COVID on 02/07/23 return date is 10 days from positive test Kham has been admitted to hospital since 02/14/23 for pnuemonia and altered mental status/disorientation. 30 day review completed. ITP sent to Dr. Dina Rich, Medical Director of Cardiac Rehab. Continue with ITP unless changes are made by physician. Pt is still admitted in neuro rehab hospital.  He will need significant PT/OT once home and is not  currenlty appropriate for rehab at this.  We will discharge him from program at this time.            Comments: Discharge ITP

## 2023-03-14 NOTE — Progress Notes (Signed)
PROGRESS NOTE    [name] [name]  HQI:[id number] DOB: [birth year] DOA: 03/12/2023 PCP: Benetta Spar, MD    Brief Narrative:  72 year old with recent extensive hospitalization, extensive encephalopathy, recent COVID infection, ESRD on peritoneal dialysis and currently on hemodialysis.  Treated for sepsis.  ICU admissions.  [facility] then [facility] to rehab.  Continued delirium and encephalopathy, agitation.  TRH consulted by rehab MD for evaluation.   Assessment & Plan:   Acute on chronic metabolic encephalopathy, persistent delirium: Electrolytes are normal.  TSH ammonia and blood gas is normal. This is likely prolonged delirium, unlikely any new infectious or metabolic causes.  They have a therapy, day and night cycle regulation.  Fall precautions.  Delirium precautions. Multiple medication trials, currently on Amitriptyline 50 mg at bedtime Depakote 125 mg 3 times daily Geodon 20 mg twice daily along with as needed Zyprexa.  Agree with above plans. Pain relief, use low-dose opiates and lidocaine patches.  Multiple medical issues including Coronary artery disease, stable Type 2 diabetes, stable COPD, stable ESRD on hemodialysis, difficult hemodialysis due to behavior issues.  Hopefully he can go on dialysis today. Recent fungemia/Candida peritonitis: Completed antifungal therapy.   DVT prophylaxis: heparin injection 5,000 Units Start: 03/12/23 2200   Code Status: Full code Family Communication: None at bedside Disposition Plan: As per rehab       Consultants:  [facility]  Procedures:  Hemodialysis  Antimicrobials:  Completed   Subjective: Patient seen in the morning rounds.  He was sitting in wheelchair by the window.  Patient tells me he is fine, looks with pressured speech.  He is not able to elaborate further.  He feels rough.  He wants me to put him back to the bed.  Nursing reported intermittent agitation but he  is now out of the restraint and reportedly behavior is slightly better than yesterday.  Objective: Vitals:   03/13/23 1447 03/13/23 2017 03/14/23 0540 03/14/23 0545  BP: 111/70 130/66  117/81  Pulse: 76     Resp: 20 18  18   Temp: 98.4 F (36.9 C) 98.8 F (37.1 C)  98.1 F (36.7 C)  TempSrc:      SpO2: 97% 93%  90%  Weight:   103.4 kg   Height:       No intake or output data in the 24 hours ending 03/14/23 1108 Filed Weights   03/13/23 0637 03/13/23 1349 03/14/23 0540  Weight: 101.5 kg 104.3 kg 103.4 kg    Examination:  General: Looks fairly comfortable. Patient is alert awake and oriented to himself.  He is upset, with pressured speech .  He is grossly weak. Cardiovascular: S1-S2 normal.  Regular rhythm. Respiratory: Bilateral clear. Gastrointestinal: Soft. Ext: Left upper extremity AV fistula with bruit.  Data Reviewed: I have personally reviewed following labs and imaging studies  CBC: Recent Labs  Lab 03/08/23 0034 03/09/23 0951 03/10/23 0845 03/13/23 0816  WBC 4.1 4.4 8.2 4.3  NEUTROABS 2.8 3.1 6.7  --   HGB 7.8* 8.1* 8.5* 8.1*  HCT 25.2* 26.4* 27.2* 27.5*  MCV 92.0 93.3 90.7 93.9  PLT 156 165 189 188   Basic Metabolic Panel: Recent Labs  Lab 03/08/23 0034 03/09/23 0951 03/10/23 0845 03/13/23 0816  NA 134* 132* 134* 137  K 3.7 3.8 4.7 4.8  CL 97* 96* 99 99  CO2 23 24 24 25   GLUCOSE 155* 128* 152* 145*  BUN 34* 40* 51* 48*  CREATININE 7.98* 9.24* 11.16* 11.38*  CALCIUM  8.5* 8.4* 9.1 8.6*  MG  --  1.8 1.8  --   PHOS 4.9* 5.1* 6.6* 6.4*   GFR: Estimated Creatinine Clearance: 6.4 mL/min (A) (by C-G formula based on SCr of 11.38 mg/dL (H)). Liver Function Tests: Recent Labs  Lab 03/08/23 0034 03/10/23 0845 03/13/23 0816  ALBUMIN <1.5* 1.7* 1.6*   No results for input(s): "LIPASE", "AMYLASE" in the last 168 hours. Recent Labs  Lab 03/13/23 1540  AMMONIA 29   Coagulation Profile: Recent Labs  Lab 03/08/23 0034  INR 1.0   Cardiac  Enzymes: No results for input(s): "CKTOTAL", "CKMB", "CKMBINDEX", "TROPONINI" in the last 168 hours. BNP (last 3 results) No results for input(s): "PROBNP" in the last 8760 hours. HbA1C: No results for input(s): "HGBA1C" in the last 72 hours. CBG: Recent Labs  Lab 03/13/23 0629 03/13/23 1123 03/13/23 1641 03/13/23 2123 03/14/23 0710  GLUCAP 112* 131* 121* 125* 160*   Lipid Profile: No results for input(s): "CHOL", "HDL", "LDLCALC", "TRIG", "CHOLHDL", "LDLDIRECT" in the last 72 hours. Thyroid Function Tests: No results for input(s): "TSH", "T4TOTAL", "FREET4", "T3FREE", "THYROIDAB" in the last 72 hours. Anemia Panel: No results for input(s): "VITAMINB12", "FOLATE", "FERRITIN", "TIBC", "IRON", "RETICCTPCT" in the last 72 hours. Sepsis Labs: Recent Labs  Lab 03/13/23 2021 03/14/23 0726  PROCALCITON 4.56  --   LATICACIDVEN 2.1* 1.4    Recent Results (from the past 240 hour(s))  Respiratory (~20 pathogens) panel by PCR     Status: None   Collection Time: 03/13/23  6:22 PM   Specimen: Nasopharyngeal Swab; Respiratory  Result Value Ref Range Status   Adenovirus NOT DETECTED NOT DETECTED Final   Coronavirus 229E NOT DETECTED NOT DETECTED Final    Comment: (NOTE) The Coronavirus on the Respiratory Panel, DOES NOT test for the novel  Coronavirus (2019 nCoV)    Coronavirus HKU1 NOT DETECTED NOT DETECTED Final   Coronavirus NL63 NOT DETECTED NOT DETECTED Final   Coronavirus OC43 NOT DETECTED NOT DETECTED Final   Metapneumovirus NOT DETECTED NOT DETECTED Final   Rhinovirus / Enterovirus NOT DETECTED NOT DETECTED Final   Influenza A NOT DETECTED NOT DETECTED Final   Influenza B NOT DETECTED NOT DETECTED Final   Parainfluenza Virus 1 NOT DETECTED NOT DETECTED Final   Parainfluenza Virus 2 NOT DETECTED NOT DETECTED Final   Parainfluenza Virus 3 NOT DETECTED NOT DETECTED Final   Parainfluenza Virus 4 NOT DETECTED NOT DETECTED Final   Respiratory Syncytial Virus NOT DETECTED NOT  DETECTED Final   Bordetella pertussis NOT DETECTED NOT DETECTED Final   Bordetella Parapertussis NOT DETECTED NOT DETECTED Final   Chlamydophila pneumoniae NOT DETECTED NOT DETECTED Final   Mycoplasma pneumoniae NOT DETECTED NOT DETECTED Final    Comment: Performed at [facility], [address], [place], [place] [address]         Radiology Studies: No results found.      Scheduled Meds:  (feeding supplement) PROSource Plus  30 mL Oral BID BM   acidophilus  1 capsule Oral Daily   amitriptyline  50 mg Oral QHS   aspirin EC  81 mg Oral Daily   atorvastatin  80 mg Oral Daily   carvedilol  6.25 mg Oral BID WC   Chlorhexidine Gluconate Cloth  6 each Topical Q0600   clopidogrel  75 mg Oral Daily   [START ON 03/16/2023] darbepoetin (ARANESP) injection - DIALYSIS  150 mcg Subcutaneous Q Fri-1800   divalproex  125 mg Oral Q8H   feeding supplement  237  mL Oral BID WC   Gerhardt's butt cream  1 Application Topical QID   heparin  5,000 Units Subcutaneous Q8H   insulin aspart  0-6 Units Subcutaneous TID WC   lidocaine  1 patch Transdermal Q2200   lidocaine  1 patch Transdermal Q24H   magnesium oxide  400 mg Oral Daily   mometasone-formoterol  2 puff Inhalation BID   multivitamin  1 tablet Oral QHS   pantoprazole  40 mg Oral BID   sevelamer carbonate  800 mg Oral TID WC   ziprasidone  20 mg Oral BID WC   Continuous Infusions:   LOS: 2 days    Time spent: 25    Dorcas Carrow, MD [facility]

## 2023-03-14 NOTE — Progress Notes (Signed)
Physical Therapy Session Note  Patient Details  Name: [name] [name] MRN: [id number] Date of Birth: 10-12-1950  Today's Date: 03/14/2023 PT Individual Time: 4782-9562 PT Individual Time Calculation (min): 70 min   Short Term Goals: Week 1:  PT Short Term Goal 1 (Week 1): Pt will complete bed mobility with min assist PT Short Term Goal 2 (Week 1): Pt will complete transfers with mod assist consistently PT Short Term Goal 3 (Week 1): Pt will ambulate with LRAD mod assist 63'  Skilled Therapeutic Interventions/Progress Updates:    Pt presents in day room in [facility], agreeable to session, per communication with staff pt wanting to be near sun and wanting to wait for wife to come pick him up. Session focused on improving therapeutic alliance, decreasing overall agitation to stimulation, gentle orientation to place and use of call light in room, as well as facilitating participation with tasks of enjoyment for carryover to participation with therapies. Pt demonstrates no increase in agitation while with staff member however does demonstrate some verbal outbursts when therapist leaves room to retrieve items able to be reoriented once therapist reenters room.  Pt positioned next to window to allow pt to look outside. Pt reports that he used to play sports and he enjoys playing games such as chess and checkers. Pt engaged in playing game of checkers with therapist set up on table in front of window. Pt able to tolerate participation with game of checkers for 25 minutes total, requires gentle reorientation to task and reminders for which pieces are his as pt frequently noted to nod off to sleep. Pt demonstrates no increase in agitation during gameplay and does demonstrate good core activation and BUE movements during game play with pt self initiating to reach game pieces. Pt able to tell therapist when he was finished playing and agreeable to return to bed to await dialysis team. Pt transported back to room in [facility]  and positioned close to door. Pt ambulates ~10' with B HHA min assist x2, demonstrating short step length and decreased step height. Pt follows commands for turning to sit on EOB well, improved from previous session. Pt completes sit to supine with mod assist for BLE mgmt into bed, verbal cues for repositioning in supine. Pt repositioned towards HOB totalAx2. Therapist retrieves warm blanket to console pt while waiting for dialysis team. Pt positioned with posey belt bed alarm donned in supine, once therapist leaves room pt begins yelling that belt is too tight. Therapist reenters room to adjust belt with pt reporting improved positioning, therapist then places sign within patient's line of sight for using call bell to decrease number of verbal outbursts while in room. Pt successfully uses call bell at end of session to reassure that it is working prior to therapist leaving the room. Pt then remains in supine, posey bed alarm donned and activated, bed alarm activated, all four bed rails up, and all needs within reach at end of session.  Therapy Documentation Precautions:  Precautions Precautions: Fall Restrictions Weight Bearing Restrictions: No    Therapy/Group: Individual Therapy  Edwin Cap PT, DPT 03/14/2023, 3:52 PM

## 2023-03-14 NOTE — Progress Notes (Signed)
   03/14/23 1904  Vitals  Temp 98.3 F (36.8 C)  Temp Source Oral  BP (!) 111/52  MAP (mmHg) 67  BP Location Right Arm  BP Method Automatic  Patient Position (if appropriate) Lying  Pulse Rate (!) 59  Pulse Rate Source Monitor  ECG Heart Rate 71  Oxygen Therapy  SpO2 94 %  O2 Device Room Air  Patient Activity (if Appropriate) In bed  Pulse Oximetry Type Continuous  During Treatment Monitoring  HD Safety Checks Performed Yes  Intra-Hemodialysis Comments Tx completed (pt ended tx early)  Post Treatment  Dialyzer Clearance Lightly streaked  Liters Processed 41.2  Fluid Removed (mL) 1300 mL  Tolerated HD Treatment  (Pt request to terminate treatment early)  Fistula / Graft Left Other (Comment) Arteriovenous fistula  Placement Date/Time: 08/09/16 1327   Placed prior to admission: No  Orientation: Left  Access Location: (c) Other (Comment)  Access Type: Arteriovenous fistula  Site Condition No complications  Fistula / Graft Assessment Thrill;Bruit;Present  Status Deaccessed  Needle Size 15  Drainage Description None   Pt. Requested to terminate treatment early. Dr.Patel notified, efforts to persuade Pt to continue treatment failed. Pt left the unit stable, report given to [facility].

## 2023-03-14 NOTE — Progress Notes (Signed)
Occupational Therapy Session Note  Patient Details  Name: [name] [name] MRN: [id number] Date of Birth: Apr 24, 1951  Today's Date: 03/14/2023 OT Individual Time: 9:11-1003 OT Individual Time Calculation (min): 60 min    Short Term Goals: Week 1:  OT Short Term Goal 1 (Week 1): Pt will will complete 1 step of UB dressing task OT Short Term Goal 2 (Week 1): Patient will stand with min A in preparation for BADL task OT Short Term Goal 3 (Week 1): Pt will wash two body parts with min cues   Skilled Therapeutic Interventions/Progress Updates:    1:1 Pt received from SLP and nursing - they were trying to get pt settled in bed but pt wanted to get up. Pt transitioned to this staff well and was able to be distracted and engaged in biographical information in a calm way. Pt was oriented to the hospital (cause he was "Sick"), August and thought it was Thursday (one day off). Pt transferred into the TIS (too high) with min A with extra time. Pt requested to sit in the sun in the room.  Open the blinds and placed pt in the w/c in sun. Pt engaged in washing his face and eating breakfast (once setup). Pt did declined showering or donning clothes today until his wife gets here. After breakfast pt ambulated to the bathroom with RW with mod +2 with RW way in front of pt. Pt off balance and with forward leaning. Pt with one large LOB to the side required max A to correct. Max A with turning to the toilet. Pt did not void on the toilet but did display dyspnea 3/4. Pt O2 sats was at 87% before walk but declines to wear supplemental O2 at this time. Pt transferred with mod A back into regular standard w/c (closer to the ground). Assisted with nursing for pt's cooperation to take his meds. Pt left sitting in chair resting with heating pad on his back with blanket on call bell at his side and seat belt alarm donned. Nursing aware of positioning.   Therapy Documentation Precautions:  Precautions Precautions:  Fall Restrictions Weight Bearing Restrictions: No General: General OT Amount of Missed Time: 10 Minutes due to falling asleep/ fatigue at end of session  Vital Signs:   Pain: Pain Assessment Pain Scale: 0-10 Pain Score: 0-No pain    Therapy/Group: Individual Therapy  [name] [facility] 03/14/2023, 10:47 AM

## 2023-03-14 NOTE — Progress Notes (Signed)
Discharge Progress Report  Patient Details  Name: [name] [name] MRN: [id number] Date of Birth: Jan 13, 1951 Referring Provider:   Flowsheet Row CARDIAC REHAB PHASE II EXERCISE from 01/08/2023 in [facility]ARD[facility]AC [facility]  Referring Provider Dr. Rennis Golden        Number of Visits: 11/23 (already started once before)  Reason for Discharge:  Early Exit:  Hospitalized  Smoking History:  Social History   Tobacco Use  Smoking Status Former   Current packs/day: 0.00   Average packs/day: 1 pack/day for 25.0 years (25.0 ttl pk-yrs)   Types: Cigarettes   Start date: 03/27/1985   Quit date: 03/27/2010   Years since quitting: 12.9  Smokeless Tobacco Never  Tobacco Comments   Quit x 7 years    Diagnosis:  S/P CABG x 2  NSTEMI (non-ST elevated myocardial infarction) [facility])  Initial Exercise Prescription:  Initial Exercise Prescription - 01/08/23 1200       Date of Initial Exercise RX and Referring Provider   Date 01/08/23    Referring Provider Dr. Rennis Golden    Expected Discharge Date 03/30/23      Treadmill   MPH 1.5    Grade 0    Minutes 17      NuStep   Level 1    SPM 80    Minutes 22      Prescription Details   Frequency (times per week) 3    Duration Progress to 30 minutes of continuous aerobic without signs/symptoms of physical distress      Intensity   THRR 40-80% of Max Heartrate 59-118    Ratings of Perceived Exertion 11-13    Perceived Dyspnea 0-4      Resistance Training   Training Prescription Yes    Weight 3    Reps 10-15             Discharge Exercise Prescription (Final Exercise Prescription Changes):  Exercise Prescription Changes - 01/29/23 1200       Response to Exercise   Blood Pressure (Admit) 126/62    Blood Pressure (Exercise) 152/84    Blood Pressure (Exit) 120/70    Heart Rate (Admit) 71 bpm    Heart Rate (Exercise) 91 bpm    Heart Rate (Exit) 70 bpm    Rating of Perceived Exertion (Exercise) 12    Duration Continue with 30  min of aerobic exercise without signs/symptoms of physical distress.    Intensity THRR unchanged      Progression   Progression Continue to progress workloads to maintain intensity without signs/symptoms of physical distress.      Resistance Training   Training Prescription Yes    Weight 2    Reps 10-15      Treadmill   MPH 1.5    Grade 0    Minutes 15    METs 2.15      NuStep   Level 2    SPM 100    Minutes 15    METs 2.2      Oxygen   Maintain Oxygen Saturation 88% or higher             Functional Capacity:  6 Minute Walk     Row Name 01/08/23 1200         6 Minute Walk   Phase Initial     Distance 1000 feet     Walk Time 5 minutes     # of Rest Breaks 0     MPH 1.89  METS 1.84     RPE 12     VO2 Peak 6.45     Symptoms No     Resting HR 66 bpm     Resting BP 120/72     Resting Oxygen Saturation  96 %     Exercise Oxygen Saturation  during 6 min walk 96 %     Max Ex. HR 99 bpm     Max Ex. BP 140/76     2 Minute Post BP 126/76              Psychological, QOL, Others - Outcomes: PHQ 2/9:    01/03/2023   10:23 AM 08/08/2022    8:42 AM 02/01/2022    4:13 PM 12/29/2021    1:38 PM 09/29/2021    2:10 PM  Depression screen PHQ 2/9  Decreased Interest 3 0 0 0 0  Down, Depressed, Hopeless 0 1 0 1 0  PHQ - 2 Score 3 1 0 1 0  Altered sleeping 3 0   0  Tired, decreased energy 3 3   3   Change in appetite 2 2   1   Feeling bad or failure about yourself  0 0   2  Trouble concentrating 0 0   1  Moving slowly or fidgety/restless 0 0   0  Suicidal thoughts 0 0   0  PHQ-9 Score 11 6   7   Difficult doing work/chores Somewhat difficult Somewhat difficult   Somewhat difficult   Nutrition & Weight - Outcomes:  Pre Biometrics - 01/09/23 0747       Pre Biometrics   Height 5\' 6"  (1.676 m)    Weight 221 lb 5.5 oz (100.4 kg)    Waist Circumference 47 inches    Hip Circumference 45 inches    Waist to Hip Ratio 1.04 %    BMI (Calculated) 35.74     Triceps Skinfold 25 mm    % Body Fat 26.4 %    Grip Strength 34.1 kg    Flexibility 0 in    Single Leg Stand 0 seconds

## 2023-03-14 NOTE — Progress Notes (Signed)
Late entry, met with patient who was sitting up in chair eating lunch.  A drink had been thrown across the room and the lid to his meal was on the floor. Introduced my self and he was very loud.  Was able to redirect with appropriate behavior for about 10 min but that was his limit.  Placed call bell beside him and encouraged him to use instead of yelling.  All needs met at that time.

## 2023-03-14 NOTE — Progress Notes (Signed)
Pt noted with ongoing agitation and yelling throughout the night. PRN Zyprexa given and ineffective. Pt continue with behavior.  Dominica Severin   RN

## 2023-03-14 NOTE — Evaluation (Signed)
Speech Language Pathology Assessment and Plan  Patient Details  Name: [name] [name] MRN: [id number] Date of Birth: 05-14-1951  SLP Diagnosis: Dysphagia;Cognitive Impairments  Rehab Potential: Fair ELOS: 9/9   Today's Date: 03/14/2023 SLP Individual Time: 0800-0905 SLP Individual Time Calculation (min): 65 min  Hospital Problem: Principal Problem:   Debility Active Problems:   Type 2 diabetes mellitus with chronic kidney disease on chronic dialysis, with long-term current use of insulin (HCC)   Acute metabolic encephalopathy   HFrEF (heart failure with reduced ejection fraction) (HCC)   Morbid obesity (HCC)   Encephalopathy   Epistaxis   PAF (paroxysmal atrial fibrillation) (HCC)   Long term (current) use of insulin (HCC)   ESRD on dialysis [facility])  Past Medical History:  Past Medical History:  Diagnosis Date   Anemia    Arthritis    Asthma    Bell palsy    CAD (coronary artery disease)    a. 2014 MV: abnl w/ infap ischemia; b. 03/2013 Cath: aneurysmal bleb in the LAD w/ otw nonobs dzs-->Med Rx.   Chronic back pain    Chronic knee pain    a. 09/2015 s/p R TKA.   Chronic pain    Chronic shoulder pain    Chronic sinusitis    COPD (chronic obstructive pulmonary disease) (HCC)    Diabetes mellitus without complication (HCC)    type II    ESRD on peritoneal dialysis (HCC)    on peritoneal dialysis, [facility]   Essential hypertension    GERD (gastroesophageal reflux disease)    Gout    Gout    Hepatomegaly    noted on noncontrast CT 2015   History of hiatal hernia    Hyperlipidemia    Lateral meniscus tear    Obesity    Truncal   Obstructive sleep apnea    does not use cpap    On home oxygen therapy    uses 2l when is going somewhere per patient    PUD (peptic ulcer disease)    remote, reports f/u EGD about 8 years ago unremarkable    Reactive airway disease    related to exposure to chemical during 9/11   Sinusitis    Vitamin D deficiency    Past  Surgical History:  Past Surgical History:  Procedure Laterality Date   A/V FISTULAGRAM N/A 02/28/2023   Procedure: A/V Fistulagram;  Surgeon: Victorino Sparrow, MD;  Location: [facility] INVASIVE CV LAB;  Service: Cardiovascular;  Laterality: N/A;   ASAD LT SHOULDER  12/15/2008   left shoulder   AV FISTULA PLACEMENT Left 08/09/2016   Procedure: BRACHIOCEPHALIC ARTERIOVENOUS (AV) FISTULA CREATION LEFT ARM;  Surgeon: Sherren Kerns, MD;  Location: [facility] OR;  Service: Vascular;  Laterality: Left;   CAPD INSERTION N/A 10/07/2018   Procedure: LAPAROSCOPIC PERITONEAL CATHETER PLACEMENT;  Surgeon: Rodman Pickle, MD;  Location: [facility] ORS;  Service: General;  Laterality: N/A;   CATARACT EXTRACTION W/PHACO Left 03/28/2016   Procedure: CATARACT EXTRACTION PHACO AND INTRAOCULAR LENS PLACEMENT LEFT EYE;  Surgeon: Jethro Bolus, MD;  Location: [facility] ORS;  Service: Ophthalmology;  Laterality: Left;  CDE: 4.77   CATARACT EXTRACTION W/PHACO Right 04/11/2016   Procedure: CATARACT EXTRACTION PHACO AND INTRAOCULAR LENS PLACEMENT RIGHT EYE; CDE:  4.74;  Surgeon: Jethro Bolus, MD;  Location: [facility] ORS;  Service: Ophthalmology;  Laterality: Right;   COLONOSCOPY  10/15/2008   Fields: Rectal polyp obliterated, not retrieved, hemorrhoids, single ascending colon diverticulum near the CV. Next colonoscopy April 2020  COLONOSCOPY N/A 12/25/2014   SLF: 1. Colorectal polyps (2) removed 2. Small internal hemorrhoids 3. the left colon is severely redundant. hyperplastic polyps   CORONARY ARTERY BYPASS GRAFT N/A 06/05/2022   Procedure: OFF PUMP CORONARY ARTERY BYPASS GRAFTING (CABG) X 2 BYPASSES USING LEFT INTERNAL MAMMARY ARTERY AND RIGHT LEG GREATER SAPHENOUS VEIN HARVESTED ENDOSCOPICALLY;  Surgeon: Corliss Skains, MD;  Location: [facility] OR;  Service: Open Heart Surgery;  Laterality: N/A;   CORONARY STENT INTERVENTION N/A 07/25/2021   Procedure: CORONARY STENT INTERVENTION;  Surgeon: Tonny Bollman, MD;  Location: [facility] INVASIVE CV LAB;   Service: Cardiovascular;  Laterality: N/A;   CORONARY STENT INTERVENTION N/A 12/26/2021   Procedure: CORONARY STENT INTERVENTION;  Surgeon: Yvonne Kendall, MD;  Location: [facility] INVASIVE CV LAB;  Service: Cardiovascular;  Laterality: N/A;   CORONARY STENT INTERVENTION N/A 01/20/2022   Procedure: CORONARY STENT INTERVENTION;  Surgeon: Tonny Bollman, MD;  Location: [facility] INVASIVE CV LAB;  Service: Cardiovascular;  Laterality: N/A;   DOPPLER ECHOCARDIOGRAPHY     ESOPHAGOGASTRODUODENOSCOPY N/A 12/25/2014   SLF: 1. Anemia most likely due to CRI, gastritis, gastric polyps 2. Moderate non-erosive gastriits and mild duodenitis.  3.TWo large gstric polyps removed.    EYE SURGERY  12/22/2010   tear duct probing-[place]   FOREIGN BODY REMOVAL  03/29/2011   Procedure: REMOVAL FOREIGN BODY EXTREMITY;  Surgeon: Fuller Canada, MD;  Location: [facility] ORS;  Service: Orthopedics;  Laterality: Right;  Removal Foreign Body Right Thumb   IR FLUORO GUIDE CV LINE RIGHT  08/06/2018   IR FLUORO GUIDE CV LINE RIGHT  02/17/2023   IR US GUIDE VASC ACCESS RIGHT  08/06/2018   IR US GUIDE VASC ACCESS RIGHT  02/17/2023   KNEE ARTHROSCOPY  10/16/2007   left   KNEE ARTHROSCOPY WITH LATERAL MENISECTOMY Right 10/14/2015   Procedure: LEFT KNEE ARTHROSCOPY WITH PARTIAL LATERAL MENISECTOMY;  Surgeon: Vickki Hearing, MD;  Location: [facility] ORS;  Service: Orthopedics;  Laterality: Right;   LEFT HEART CATH AND CORONARY ANGIOGRAPHY N/A 07/25/2021   Procedure: LEFT HEART CATH AND CORONARY ANGIOGRAPHY;  Surgeon: Tonny Bollman, MD;  Location: [facility] INVASIVE CV LAB;  Service: Cardiovascular;  Laterality: N/A;   LEFT HEART CATH AND CORONARY ANGIOGRAPHY N/A 12/26/2021   Procedure: LEFT HEART CATH AND CORONARY ANGIOGRAPHY;  Surgeon: Yvonne Kendall, MD;  Location: [facility] INVASIVE CV LAB;  Service: Cardiovascular;  Laterality: N/A;   LEFT HEART CATH AND CORONARY ANGIOGRAPHY N/A 01/20/2022   Procedure: LEFT HEART CATH AND CORONARY ANGIOGRAPHY;  Surgeon: Tonny Bollman, MD;  Location: [facility] INVASIVE CV LAB;  Service: Cardiovascular;  Laterality: N/A;   LEFT HEART CATHETERIZATION WITH CORONARY ANGIOGRAM N/A 03/28/2013   Procedure: LEFT HEART CATHETERIZATION WITH CORONARY ANGIOGRAM;  Surgeon: Marykay Lex, MD;  Location: [facility] CATH LAB;  Service: Cardiovascular;  Laterality: N/A;   NM MYOVIEW LTD     PENILE PROSTHESIS IMPLANT N/A 08/16/2015   Procedure: PENILE PROTHESIS INFLATABLE, three piece, Excisional biopsy of Penile ulcer, Penile molding;  Surgeon: Jethro Bolus, MD;  Location: [facility] ORS;  Service: Urology;  Laterality: N/A;   PENILE PROSTHESIS IMPLANT N/A 12/24/2017   Procedure: REMOVAL AND  REPLACEMENT  COLOPLAST PENILE PROSTHESIS;  Surgeon: Crista Elliot, MD;  Location: [facility] ORS;  Service: Urology;  Laterality: N/A;   PERIPHERAL VASCULAR BALLOON ANGIOPLASTY  02/28/2023   Procedure: PERIPHERAL VASCULAR BALLOON ANGIOPLASTY;  Surgeon: Victorino Sparrow, MD;  Location: [facility] INVASIVE CV LAB;  Service: Cardiovascular;;   PORT-A-CATH REMOVAL N/A 02/19/2023   Procedure: REMOVAL OF  PERITONEAL DIALYSIS CATHETER;  Surgeon: Victorino Sparrow, MD;  Location: [facility] OR;  Service: Vascular;  Laterality: N/A;   QUADRICEPS TENDON REPAIR  07/21/2011   Procedure: REPAIR QUADRICEP TENDON;  Surgeon: Fuller Canada, MD;  Location: [facility]S;  Service: Orthopedics;  Laterality: Right;   RIGHT/LEFT HEART CATH AND CORONARY ANGIOGRAPHY N/A 05/30/2022   Procedure: RIGHT/LEFT HEART CATH AND CORONARY ANGIOGRAPHY;  Surgeon: Corky Crafts, MD;  Location: [facility] INVASIVE CV LAB;  Service: Cardiovascular;  Laterality: N/A;   TEE WITHOUT CARDIOVERSION N/A 06/05/2022   Procedure: TRANSESOPHAGEAL ECHOCARDIOGRAM (TEE);  Surgeon: Corliss Skains, MD;  Location: [facility] OR;  Service: Open Heart Surgery;  Laterality: N/A;   TOENAIL EXCISION     removed x2-bilateral   UMBILICAL HERNIA REPAIR  07/17/2005   [place]    Assessment / Plan / Recommendation Clinical Impression  Pt is a 72 y/o  male with PMH of CABG (Nov 23), COPD, DM2, GERD, hypotension on midodrine, and ESRD on PD who was admitted to [facility] on 02/15/23 with c/o AMS and shortness of breath over a period of a few days. Workup revealed sepsis 2/2 catheter peritonitis from PD catheter and polymicrobial bacteremia/fungemia. Hospital course was complicated by worsening encephalopathy and hypoxemia requiring transfer to the ICU. He was transitioned to hemodialysis on TRS schedule. He was started on Unasyn and micafungin. He transitioned to [facility] on 8/16 for further care. He was weaned to room air and tolerating cpap at night. Pt started on coreg for NSVT. Pt has required PRN doses of geodon for agitation which appears to have improved. CTH on 8/20 no acute or reversible finding. Pt c/o back pain on 8/23 and imaging was order but pt not able to tolerate so was cancelled. Therapy ongoing and recommendations are for CIR.    Patient presents with clinical signs/symptoms concerning for potential aspiration with thin liquids, however suspect current mood controlling medications may play a role in current swallowing status. During previous speech/language evaluation session, patient presented with strong oral mechanism with SLP noting no overt functional deficits. This session, patient presented with reduced speech intelligibility and increased drowsiness which likely impacted swallowing observations made during today's swallow assessment. SLP administered regular solids, puree, and thin liquid from straw noting no overt s/sx of penetration/aspiration nor difficulty with all solid textures. With thin liquids, patient exhibited wet vocal quality and immediate throat clear x3. Given patient's hx of COPD and CABG, patient would benefit from objective swallow study (FEES>MBSS), however patient remains inappropriate for swallow study at this time 2* ongoing agitated behaviors, violent threats to staff, and waxing/waning behavioral  status. As patient's current level of swallowing function may be impacted by behavior controlling medications, SLP will continue to assess current diet tolerance prior to making any downgrades. Per nursing, patient exhibits no difficulty with current diet since CIR admission. Recommend continuation of current diet with SLP monitoring closely for changes in status and/or appropriateness for instrumental study.   Daily session: SLP conducted skilled therapy session targeting cognitive retraining goals. Patient exhibited continued reduced awareness, impaired social pragmatics, behavior related impulsivity and safety concerns, and reduced orientation to situation and time. SLP and patient discussed date and day of the week and then conducted 2nd attempt at [facility] examination to determine specific cognitive deficits for purpose of treatment session planning. Patient completed 22 out of 30 points worth of questions prior to declining further participation. Throughout these tasks, patient scored 2/22 with only correct points for identifying shapes and for identifying  state. At end of session, patient requested assistance to use bedside commode. SLP requested assistance from RN for transfer with patient becoming increasingly verbally agitated during waiting period and exhibited significantly reduced safety and reduced command following capacity. On bedside commode, patient was continent of bowel, then transferred back to bed. Warm handoff completed to occupational therapist at end of session. SLP will continue to target plan of care.    Skilled Therapeutic Interventions          SLP conducted bedside swallow evaluation to determine current level of swallowing function. Full results above. This session, SLP further conducted daily session targeting cognitive goals with assessment above.    SLP Assessment  Patient will need skilled Speech Lanaguage Pathology Services during CIR admission    Recommendations  SLP Diet  Recommendations: Thin;Age appropriate regular solids Liquid Administration via: Spoon;Cup;Straw Medication Administration: Whole meds with liquid Supervision: Patient able to self feed Compensations: Minimize environmental distractions Postural Changes and/or Swallow Maneuvers: Seated upright 90 degrees;Upright 30-60 min after meal Oral Care Recommendations: Oral care BID Recommendations for Other Services: Neuropsych consult Patient destination: Home Follow up Recommendations: Home Health SLP;24 hour supervision/assistance Equipment Recommended: None recommended by SLP    SLP Frequency 3 to 5 out of 7 days   SLP Duration  SLP Intensity  SLP Treatment/Interventions 9/9  Minumum of 1-2 x/day, 30 to 90 minutes  Cognitive remediation/compensation;Environmental controls;Cueing hierarchy;Functional tasks;Therapeutic Activities;Internal/external aids;Patient/family education    Pain Pain Assessment Pain Scale: 0-10 Pain Score: 0-No pain  SLP Evaluation Oral Motor Oral Motor/Sensory Function Overall Oral Motor/Sensory Function: Moderate impairment Motor Speech Overall Motor Speech: Impaired Respiration: Impaired Level of Impairment: Sentence Resonance: Within functional limits Articulation: Impaired Level of Impairment: Phrase Intelligibility: Intelligibility reduced Word: 75-100% accurate Phrase: 50-74% accurate Sentence: 50-74% accurate Conversation: 50-74% accurate Motor Planning: Witnin functional limits Motor Speech Errors: Consistent Effective Techniques: Slow rate;Increased vocal intensity;Over-articulate  Care Tool Care Tool Cognition Ability to hear (with hearing aid or hearing appliances if normally used Ability to hear (with hearing aid or hearing appliances if normally used): 0. Adequate - no difficulty in normal conservation, social interaction, listening to TV   Expression of Ideas and Wants Expression of Ideas and Wants: 3. Some difficulty - exhibits some  difficulty with expressing needs and ideas (e.g, some words or finishing thoughts) or speech is not clear   Understanding Verbal and Non-Verbal Content Understanding Verbal and Non-Verbal Content: 3. Usually understands - understands most conversations, but misses some part/intent of message. Requires cues at times to understand  Memory/Recall Ability Memory/Recall Ability : That he or she is in a hospital/hospital unit   Intelligibility: Intelligibility reduced Word: 75-100% accurate Phrase: 50-74% accurate Sentence: 50-74% accurate Conversation: 50-74% accurate  Bedside Swallowing Assessment General Date of Onset: 02/15/23 Previous Swallow Assessment: n/a Diet Prior to this Study: Regular;Thin liquids (Level 0) Temperature Spikes Noted: No Respiratory Status: Room air History of Recent Intubation: No Behavior/Cognition: Alert;Agitated;Impulsive;Uncooperative;Distractible;Requires cueing;Doesn't follow directions;Fusing/Irritable Oral Cavity - Dentition: Poor condition Self-Feeding Abilities: Needs set up Patient Positioning: Upright in bed Baseline Vocal Quality: Normal Volitional Cough: Weak Volitional Swallow: Able to elicit  Oral Care Assessment   Ice Chips Ice chips: Not tested Thin Liquid Thin Liquid: Impaired Presentation: Straw Pharyngeal  Phase Impairments: Decreased hyoid-laryngeal movement;Wet Vocal Quality;Throat Clearing - Immediate;Throat Clearing - Delayed Nectar Thick Nectar Thick Liquid: Not tested Honey Thick Honey Thick Liquid: Not tested Puree Puree: Within functional limits Presentation: Self Fed;Spoon Solid Solid: Within functional limits Presentation: Self Fed;Spoon BSE Assessment  Risk for Aspiration Impact on safety and function: Mild aspiration risk Other Related Risk Factors: Cognitive impairment;Decreased respiratory status  Short Term Goals: Week 1: SLP Short Term Goal 1 (Week 1): Patient will demonstrate intellectual awareness of  deficits via verbally stating 2 cognitive and 2 physical impairments given maxA SLP Short Term Goal 2 (Week 1): Patient will participate in bedside swallow evaluation to determine least restrictive safe diet and need for compensatory strategies SLP Short Term Goal 3 (Week 1): Patient will utilize external aids to orient to time and situation given modA SLP Short Term Goal 4 (Week 1): Patient will recall basic daily information and events in 80% of opportunities events given maxA SLP Short Term Goal 5 (Week 1): Patient will communicate wants and needs respectfully to nursing staff utilizing provided room mechanisms (i.e. call bell) given modA SLP Short Term Goal 6 (Week 1): Patient will participate in ongoing assessment of diet tolerance with SLP to determine effects of medications and comorbidities on true swallowing function.  Refer to Care Plan for Long Term Goals  Recommendations for other services: Neuropsych  Discharge Criteria: Patient will be discharged from SLP if patient refuses treatment 3 consecutive times without medical reason, if treatment goals not met, if there is a change in medical status, if patient makes no progress towards goals or if patient is discharged from hospital.  The above assessment, treatment plan, treatment alternatives and goals were discussed and mutually agreed upon: No family available/patient unable  [name], M.A., CCC-SLP   Morrie Sheldon A Tasheena Wambolt 03/14/2023, 1:22 PM

## 2023-03-14 NOTE — Progress Notes (Addendum)
[facility] Progress Note   Subjective:  Seen in room - sitting in recliner by window. Calm at this time - agreeable for HD later today. Unable to be dialyzed yesterday due to agitation - has been present for 2 weeks per wife, work-up ongoing. Ammonia ok. Brain MRI pending.  Objective Vitals:   03/13/23 1447 03/13/23 2017 03/14/23 0540 03/14/23 0545  BP: 111/70 130/66  117/81  Pulse: 76     Resp: 20 18  18   Temp: 98.4 F (36.9 C) 98.8 F (37.1 C)  98.1 F (36.7 C)  TempSrc:      SpO2: 97% 93%  90%  Weight:   103.4 kg   Height:       Physical Exam General: Well appearing man, NAD. In recliner. Heart: RRR; no murmur Lungs: CTA anteriorly Extremities: no LE edema Dialysis Access:  LUE AVF + bruit  Additional Objective Labs: Basic Metabolic Panel: Recent Labs  Lab 03/09/23 0951 03/10/23 0845 03/13/23 0816  NA 132* 134* 137  K 3.8 4.7 4.8  CL 96* 99 99  CO2 24 24 25   GLUCOSE 128* 152* 145*  BUN 40* 51* 48*  CREATININE 9.24* 11.16* 11.38*  CALCIUM 8.4* 9.1 8.6*  PHOS 5.1* 6.6* 6.4*   Liver Function Tests: Recent Labs  Lab 03/08/23 0034 03/10/23 0845 03/13/23 0816  ALBUMIN <1.5* 1.7* 1.6*   CBC: Recent Labs  Lab 03/08/23 0034 03/09/23 0951 03/10/23 0845 03/13/23 0816  WBC 4.1 4.4 8.2 4.3  NEUTROABS 2.8 3.1 6.7  --   HGB 7.8* 8.1* 8.5* 8.1*  HCT 25.2* 26.4* 27.2* 27.5*  MCV 92.0 93.3 90.7 93.9  PLT 156 165 189 188   Medications:   (feeding supplement) PROSource Plus  30 mL Oral BID BM   acidophilus  1 capsule Oral Daily   amitriptyline  50 mg Oral QHS   aspirin EC  81 mg Oral Daily   atorvastatin  80 mg Oral Daily   carvedilol  6.25 mg Oral BID WC   Chlorhexidine Gluconate Cloth  6 each Topical Q0600   clopidogrel  75 mg Oral Daily   [START ON 03/16/2023] darbepoetin (ARANESP) injection - DIALYSIS  150 mcg Subcutaneous Q Fri-1800   divalproex  125 mg Oral Q8H   feeding supplement  237 mL Oral BID WC   Gerhardt's butt cream  1  Application Topical QID   heparin  5,000 Units Subcutaneous Q8H   insulin aspart  0-6 Units Subcutaneous TID WC   lidocaine  1 patch Transdermal Q2200   lidocaine  1 patch Transdermal Q24H   magnesium oxide  400 mg Oral Daily   mometasone-formoterol  2 puff Inhalation BID   multivitamin  1 tablet Oral QHS   pantoprazole  40 mg Oral BID   sevelamer carbonate  800 mg Oral TID WC   ziprasidone  20 mg Oral BID WC    Dialysis Orders: Previously on [facility] thru [facility] - changed to HD - will need outpatient HD unit established prior to d/c.   Assessment/Plan: Debility: In CIR - follow. Encephalopathy/agitation: Unclear cause - present for past 1-2 weeks per wife (was happening at [facility] as well). Work-up ongoing. Unable to dialyze yesterday - was upset/yelling - seems reasonably calm today. If testing negative and this is his new baseline - will need further GOC discussions. ESRD:  Transitioned from PD to HD recently -> usual TTS schedule. Unable to dialyze 8/27 -> trying again today. Using old AVF which is still functional, s/p  f'gram 02/28/23. Hypertension/volume: BP stable, 2L UFG planned with, establishing dry weight. Anemia: Hgb 8.1 - looks like scheduled to get Aranesp q Friday - will ^ next dose. Checking iron studies. Metabolic bone disease: CorrCa ok, Phos high - does not appear to have been getting binder - have started sevelamer 1/meals.  Nutrition:  Alb very low, continue supps.  Hx NSVT: On Coreg  CAD  T2DM  Recent COVID July 2024 c/b HCAP s/p antibiotic course (Unasyn) on 8/21  Recent fungemia/candida peritonitis s/p antifungal course  Ozzie Hoyle, PA-C 03/14/2023, 10:17 AM   [facility]   Seen and examined independently.  Agree with note and exam as documented above by physician extender and as noted here.  Seen and examined on dialysis.  Procedure supervised.  Blood pressure 105/64 and HR 68.  Left AVF in use.  Tolerating goal.  He is calm on exam  and talking with staff.  Markedly different than yesterday.  Chewing gum which he is happy about.    Estanislado Emms, MD 03/14/2023  6:25 PM

## 2023-03-14 NOTE — Plan of Care (Signed)
   03/14/23 0700  Behavioral Plan Guideline  [place] Level  (N/A)  Behavior to decrease/eliminate Decrease verbal agitation with staff; Decrease physical agitation with staff ;Increase participation ;Improve pain; Decrease impulsivity; Decrease pulling at lines/tubes; Decrease restlessness; Decrease incontinence  Decrease pulling at lines/tubes   (tolerate dialysis)  Changes to environment Lights on, blinds open during the day, off and closed at night; Door open when staff not in room; Urinal at bedside; Within arms reach during toileting; Place signs in room to remind pt to call for assistance  Interventions Telesitter; # of rails up; Bed alarm setting Trial sitting at nurse's station to assess tolerance  Bed alarm setting Highest sensitivity  # of rails up 4  Recommendations for interactions with patient Remain calm and reduce environment stimulation with agitation; Redirect behaviors to alternate tasks/topics; DO NOT argue with patient; Provide gentle orientation or indirect orientation Venetia Maxon redirection and cause and effective behavior  In Attendance at [employer] Meeting  OT;PT;SLP;RN

## 2023-03-15 ENCOUNTER — Ambulatory Visit: Payer: Medicare Other | Admitting: Nurse Practitioner

## 2023-03-15 DIAGNOSIS — D631 Anemia in chronic kidney disease: Secondary | ICD-10-CM | POA: Diagnosis not present

## 2023-03-15 DIAGNOSIS — I10 Essential (primary) hypertension: Secondary | ICD-10-CM

## 2023-03-15 DIAGNOSIS — I120 Hypertensive chronic kidney disease with stage 5 chronic kidney disease or end stage renal disease: Secondary | ICD-10-CM | POA: Diagnosis not present

## 2023-03-15 DIAGNOSIS — E1122 Type 2 diabetes mellitus with diabetic chronic kidney disease: Secondary | ICD-10-CM

## 2023-03-15 DIAGNOSIS — N250 Renal osteodystrophy: Secondary | ICD-10-CM | POA: Diagnosis not present

## 2023-03-15 DIAGNOSIS — R5381 Other malaise: Secondary | ICD-10-CM | POA: Diagnosis not present

## 2023-03-15 DIAGNOSIS — R4182 Altered mental status, unspecified: Secondary | ICD-10-CM | POA: Diagnosis not present

## 2023-03-15 DIAGNOSIS — E782 Mixed hyperlipidemia: Secondary | ICD-10-CM

## 2023-03-15 DIAGNOSIS — Z992 Dependence on renal dialysis: Secondary | ICD-10-CM | POA: Diagnosis not present

## 2023-03-15 DIAGNOSIS — N186 End stage renal disease: Secondary | ICD-10-CM | POA: Diagnosis not present

## 2023-03-15 DIAGNOSIS — Z794 Long term (current) use of insulin: Secondary | ICD-10-CM

## 2023-03-15 LAB — RENAL FUNCTION PANEL
Albumin: 1.6 g/dL — ABNORMAL LOW (ref 3.5–5.0)
Anion gap: 13 (ref 5–15)
BUN: 48 mg/dL — ABNORMAL HIGH (ref 8–23)
CO2: 24 mmol/L (ref 22–32)
Calcium: 8.4 mg/dL — ABNORMAL LOW (ref 8.9–10.3)
Chloride: 96 mmol/L — ABNORMAL LOW (ref 98–111)
Creatinine, Ser: 10.85 mg/dL — ABNORMAL HIGH (ref 0.61–1.24)
GFR, Estimated: 5 mL/min — ABNORMAL LOW (ref >60)
Glucose, Bld: 136 mg/dL — ABNORMAL HIGH (ref 70–99)
Phosphorus: 5.4 mg/dL — ABNORMAL HIGH (ref 2.5–4.6)
Potassium: 4.5 mmol/L (ref 3.5–5.1)
Sodium: 133 mmol/L — ABNORMAL LOW (ref 135–145)

## 2023-03-15 LAB — CBC
HCT: 24.5 % — ABNORMAL LOW (ref 39.0–52.0)
Hemoglobin: 7.5 g/dL — ABNORMAL LOW (ref 13.0–17.0)
MCH: 28.1 pg (ref 26.0–34.0)
MCHC: 30.6 g/dL (ref 30.0–36.0)
MCV: 91.8 fL (ref 80.0–100.0)
Platelets: 161 10*3/uL (ref 150–400)
RBC: 2.67 MIL/uL — ABNORMAL LOW (ref 4.22–5.81)
RDW: 18.7 % — ABNORMAL HIGH (ref 11.5–15.5)
WBC: 4.6 10*3/uL (ref 4.0–10.5)
nRBC: 0 % (ref 0.0–0.2)

## 2023-03-15 LAB — GLUCOSE, CAPILLARY
Glucose-Capillary: 106 mg/dL — ABNORMAL HIGH (ref 70–99)
Glucose-Capillary: 133 mg/dL — ABNORMAL HIGH (ref 70–99)
Glucose-Capillary: 96 mg/dL (ref 70–99)
Glucose-Capillary: 102 mg/dL — ABNORMAL HIGH (ref 70–99)

## 2023-03-15 LAB — IRON AND TIBC
Iron: 94 ug/dL (ref 45–182)
Saturation Ratios: 38 % (ref 17.9–39.5)
TIBC: 248 ug/dL — ABNORMAL LOW (ref 250–450)
UIBC: 154 ug/dL

## 2023-03-15 LAB — FERRITIN: Ferritin: 2175 ng/mL — ABNORMAL HIGH (ref 24–336)

## 2023-03-15 MED ORDER — HEPARIN SODIUM (PORCINE) 1000 UNIT/ML DIALYSIS: 1000.0000 [IU] | INTRAMUSCULAR | Status: DC | PRN

## 2023-03-15 MED ORDER — ALTEPLASE 2 MG IJ SOLR: 2.0000 mg | Freq: Once | INTRAMUSCULAR | Status: DC | PRN

## 2023-03-15 MED ORDER — ANTICOAGULANT SODIUM CITRATE 4% (200MG/5ML) IV SOLN: 5.0000 mL | Status: DC | PRN

## 2023-03-15 MED ORDER — PENTAFLUOROPROP-TETRAFLUOROETH EX AERO: 1.0000 | INHALATION_SPRAY | CUTANEOUS | Status: DC | PRN

## 2023-03-15 MED ORDER — LIDOCAINE-PRILOCAINE 2.5-2.5 % EX CREA: 1.0000 | TOPICAL_CREAM | CUTANEOUS | Status: DC | PRN

## 2023-03-15 MED ORDER — LIDOCAINE HCL (PF) 1 % IJ SOLN: 5.0000 mL | INTRAMUSCULAR | Status: DC | PRN

## 2023-03-15 MED ORDER — HEPARIN SODIUM (PORCINE) 1000 UNIT/ML DIALYSIS
2000.0000 [IU] | Freq: Once | INTRAMUSCULAR | Status: DC
  Filled 2023-03-15: qty 2

## 2023-03-15 MED ORDER — OXYCODONE HCL 5 MG PO TABS
2.5000 mg | ORAL_TABLET | Freq: Four times a day (QID) | ORAL | Status: DC | PRN
  Administered 2023-03-15 – 2023-03-16 (×2): 5 mg via ORAL
  Filled 2023-03-15 (×2): qty 1

## 2023-03-15 MED ORDER — HEPARIN SODIUM (PORCINE) 1000 UNIT/ML DIALYSIS
2000.0000 [IU] | Freq: Once | INTRAMUSCULAR | Status: DC
  Filled 2023-03-15 (×2): qty 2

## 2023-03-15 NOTE — Progress Notes (Signed)
PROGRESS NOTE    [name] [name]  YQM:[id number] DOB: Mar 14, 1951 DOA: 03/12/2023 PCP: Benetta Spar, MD    Brief Narrative:  72 year old with recent extensive hospitalization, extensive encephalopathy, recent COVID infection, ESRD on peritoneal dialysis and currently on hemodialysis.  Treated for sepsis.  ICU admissions.  [facility].  Continued delirium and encephalopathy, agitation.  TRH consulted by rehab MD for evaluation to look for any organic causes. 8/28, on different medication regimen trials 8/29, clinically improving with occasional impulsiveness.  Case discussed with attending.  Will sign off.  Please call back if needed.   Assessment & Plan:   Acute on chronic metabolic encephalopathy, persistent delirium: Electrolytes are normal.  TSH ammonia and blood gas is normal. Prolonged delirium due to recent infection, multiple hospitalization and severe medical illness. unlikely any new infectious or metabolic causes.  day and night cycle regulation.  Fall precautions.  Delirium precautions. Multiple medication trials, currently on Amitriptyline 50 mg at bedtime Depakote 125 mg 3 times daily Geodon 20 mg twice daily along with as needed Zyprexa.  Agree with above plans. Pain relief, use low-dose opiates and lidocaine patches. Patient has not needed any as needed Zyprexa over last 24 hours.  Hopefully can be managed on above regimen and gradually taper off once clinically improved. EEG was essentially normal.  MRI brain pending, however not likely will change any outcome or management plan.  Multiple medical issues including Coronary artery disease, stable Type 2 diabetes, stable COPD, stable ESRD on hemodialysis, had difficulty with hemodialysis however he was able to do partial hemodialysis on 8/28.  Hopefully he can go on dialysis tomorrow. Recent fungemia/Candida peritonitis: Completed antifungal  therapy.   DVT prophylaxis: heparin injection 5,000 Units Start: 03/12/23 2200   Code Status: Full code Family Communication: None at bedside Disposition Plan: As per rehab       Consultants:  [facility]  Procedures:  Hemodialysis  Antimicrobials:  Completed   Subjective:  Patient seen and examined.  He was asleep after working with therapies in the morning.  Discussed with bedside nursing staff and attending physician.  His behavior was better controlled especially after starting Depakote.  He also did partial hemodialysis yesterday.  He has been less agitated and less angry.  Since he was asleep comfortably, I did not wake him up.  Objective: Vitals:   03/14/23 1830 03/14/23 1904 03/14/23 2013 03/15/23 0500  BP: 113/70 (!) 111/52 128/87   Pulse: (!) 33 (!) 59 74   Resp: 18  18   Temp:  98.3 F (36.8 C) (!) 97.5 F (36.4 C)   TempSrc:  Oral    SpO2: 92% 94% (!) 74%   Weight:    106.6 kg  Height:        Intake/Output Summary (Last 24 hours) at 03/15/2023 1105 Last data filed at 03/14/2023 1904 Gross per 24 hour  Intake 237 ml  Output 1300 ml  Net -1063 ml   Filed Weights   03/14/23 0540 03/14/23 1636 03/15/23 0500  Weight: 103.4 kg 105 kg 106.6 kg    Examination:  General: Looks fairly comfortable and sleepy.  Did not wake up for interaction today. Cardiovascular: S1-S2 normal.  Regular rhythm. Respiratory: Bilateral clear. Gastrointestinal: Soft. Ext: Left upper extremity AV fistula with bruit.  Data Reviewed: I have personally reviewed following labs and imaging studies  CBC: Recent Labs  Lab 03/09/23 0951 03/10/23 0845 03/13/23 0816  WBC 4.4 8.2 4.3  NEUTROABS  3.1 6.7  --   HGB 8.1* 8.5* 8.1*  HCT 26.4* 27.2* 27.5*  MCV 93.3 90.7 93.9  PLT 165 189 188   Basic Metabolic Panel: Recent Labs  Lab 03/09/23 0951 03/10/23 0845 03/13/23 0816  NA 132* 134* 137  K 3.8 4.7 4.8  CL 96* 99 99  CO2 24 24 25   GLUCOSE 128* 152* 145*  BUN  40* 51* 48*  CREATININE 9.24* 11.16* 11.38*  CALCIUM 8.4* 9.1 8.6*  MG 1.8 1.8  --   PHOS 5.1* 6.6* 6.4*   GFR: Estimated Creatinine Clearance: 6.5 mL/min (A) (by C-G formula based on SCr of 11.38 mg/dL (H)). Liver Function Tests: Recent Labs  Lab 03/10/23 0845 03/13/23 0816  ALBUMIN 1.7* 1.6*   No results for input(s): "LIPASE", "AMYLASE" in the last 168 hours. Recent Labs  Lab 03/13/23 1540  AMMONIA 29   Coagulation Profile: No results for input(s): "INR", "PROTIME" in the last 168 hours.  Cardiac Enzymes: No results for input(s): "CKTOTAL", "CKMB", "CKMBINDEX", "TROPONINI" in the last 168 hours. BNP (last 3 results) No results for input(s): "PROBNP" in the last 8760 hours. HbA1C: No results for input(s): "HGBA1C" in the last 72 hours. CBG: Recent Labs  Lab 03/13/23 2123 March 29, 2023 0710 03-29-23 1136 29-Mar-2023 2113 03/15/23 0527  GLUCAP 125* 160* 118* 130* 106*   Lipid Profile: No results for input(s): "CHOL", "HDL", "LDLCALC", "TRIG", "CHOLHDL", "LDLDIRECT" in the last 72 hours. Thyroid Function Tests: No results for input(s): "TSH", "T4TOTAL", "FREET4", "T3FREE", "THYROIDAB" in the last 72 hours. Anemia Panel: No results for input(s): "VITAMINB12", "FOLATE", "FERRITIN", "TIBC", "IRON", "RETICCTPCT" in the last 72 hours. Sepsis Labs: Recent Labs  Lab 03/13/23 2021 03/29/23 0726  PROCALCITON 4.56  --   LATICACIDVEN 2.1* 1.4    Recent Results (from the past 240 hour(s))  Respiratory (~20 pathogens) panel by PCR     Status: None   Collection Time: 03/13/23  6:22 PM   Specimen: Nasopharyngeal Swab; Respiratory  Result Value Ref Range Status   Adenovirus NOT DETECTED NOT DETECTED Final   Coronavirus 229E NOT DETECTED NOT DETECTED Final    Comment: (NOTE) The Coronavirus on the Respiratory Panel, DOES NOT test for the novel  Coronavirus (2019 nCoV)    Coronavirus HKU1 NOT DETECTED NOT DETECTED Final   Coronavirus NL63 NOT DETECTED NOT DETECTED Final    Coronavirus OC43 NOT DETECTED NOT DETECTED Final   Metapneumovirus NOT DETECTED NOT DETECTED Final   Rhinovirus / Enterovirus NOT DETECTED NOT DETECTED Final   Influenza A NOT DETECTED NOT DETECTED Final   Influenza B NOT DETECTED NOT DETECTED Final   Parainfluenza Virus 1 NOT DETECTED NOT DETECTED Final   Parainfluenza Virus 2 NOT DETECTED NOT DETECTED Final   Parainfluenza Virus 3 NOT DETECTED NOT DETECTED Final   Parainfluenza Virus 4 NOT DETECTED NOT DETECTED Final   Respiratory Syncytial Virus NOT DETECTED NOT DETECTED Final   Bordetella pertussis NOT DETECTED NOT DETECTED Final   Bordetella Parapertussis NOT DETECTED NOT DETECTED Final   Chlamydophila pneumoniae NOT DETECTED NOT DETECTED Final   Mycoplasma pneumoniae NOT DETECTED NOT DETECTED Final    Comment: Performed at [facility], [address], [place], [place] [address]         Radiology Studies: EEG adult  Result Date: 03-29-2023 [name], MD     29-Mar-2023  3:59 PM Patient Name: [name] [name] MRN: [id number] Epilepsy Attending: [name] Referring Physician/Provider: [name], MD Date: 03-29-23 Duration: 24.27 mins  Patient history: 72yo M with ams getting eeg to evaluate for seizure Level of alertness: Awake, AEDs during EEG study: None Technical aspects: This EEG study was done with scalp electrodes positioned according to the 10-20 International system of electrode placement. Electrical activity was reviewed with band pass filter of 1-70Hz , sensitivity of 7 uV/mm, display speed of 67mm/sec with a 60Hz  notched filter applied as appropriate. EEG data were recorded continuously and digitally stored.  Video monitoring was available and reviewed as appropriate. Description: The posterior dominant rhythm consists of 8 Hz activity of moderate voltage (25-35 uV) seen predominantly in posterior head regions, symmetric and reactive to eye opening and eye closing. EEG showed intermittent generalized  3  to 6 Hz theta-delta slowing. Hyperventilation and photic stimulation were not performed.   ABNORMALITY - Intermittent slow, generalized IMPRESSION: This study is suggestive of mild diffuse encephalopathy, nonspecific etiology. No seizures or epileptiform discharges were seen throughout the recording. Priyanka Annabelle Harman        Scheduled Meds:  (feeding supplement) PROSource Plus  30 mL Oral BID BM   acidophilus  1 capsule Oral Daily   amitriptyline  50 mg Oral QHS   aspirin EC  81 mg Oral Daily   atorvastatin  80 mg Oral Daily   carvedilol  6.25 mg Oral BID WC   Chlorhexidine Gluconate Cloth  6 each Topical Q0600   Chlorhexidine Gluconate Cloth  6 each Topical Q0600   clopidogrel  75 mg Oral Daily   [START ON 03/16/2023] darbepoetin (ARANESP) injection - DIALYSIS  150 mcg Subcutaneous Q Fri-1800   divalproex  125 mg Oral Q8H   feeding supplement  237 mL Oral BID WC   Gerhardt's butt cream  1 Application Topical QID   heparin  5,000 Units Subcutaneous Q8H   insulin aspart  0-6 Units Subcutaneous TID WC   lidocaine  1 patch Transdermal Q2200   lidocaine  1 patch Transdermal Q24H   magnesium oxide  400 mg Oral Daily   mometasone-formoterol  2 puff Inhalation BID   multivitamin  1 tablet Oral QHS   pantoprazole  40 mg Oral BID   sevelamer carbonate  800 mg Oral TID WC   ziprasidone  20 mg Oral BID WC   Continuous Infusions:   LOS: 3 days    Time spent: 25 minutes    Dorcas Carrow, MD [facility]

## 2023-03-15 NOTE — Progress Notes (Signed)
Speech Language Pathology Daily Note  Patient Details  Name: [name] [name] MRN: [id number] Date of Birth: 16-Aug-1950  Today's Date: 03/15/2023 SLP Individual Time: 0900-0930 SLP Individual Time Calculation (min): 30 min  Short Term Goals: Week 1: SLP Short Term Goal 1 (Week 1): Patient will demonstrate intellectual awareness of deficits via verbally stating 2 cognitive and 2 physical impairments given maxA SLP Short Term Goal 2 (Week 1): Patient will participate in bedside swallow evaluation to determine least restrictive safe diet and need for compensatory strategies SLP Short Term Goal 3 (Week 1): Patient will utilize external aids to orient to time and situation given modA SLP Short Term Goal 4 (Week 1): Patient will recall basic daily information and events in 80% of opportunities events given maxA SLP Short Term Goal 5 (Week 1): Patient will communicate wants and needs respectfully to nursing staff utilizing provided room mechanisms (i.e. call bell) given modA SLP Short Term Goal 6 (Week 1): Patient will participate in ongoing assessment of diet tolerance with SLP to determine effects of medications and comorbidities on true swallowing function.  Skilled Therapeutic Interventions: SLP conducted skilled therapy session targeting cognitive retraining goals. Upon entry, patient received sitting upright in wheelchair having just finished breakfast. Patient reports no overt concerns with meal consumption including no coughing/choking across consistencies. Patient pleasantly requested room temperature be turned up and that he be moved to bed. SLP contacted nursing staff to assist with +2 transfer and throughout transfer, patient observed to follow simple 1-step directions without difficulty given mod visual cues from providers. Patient disoriented to date and day of the week and required maxA to spot external calendar aid in room. SLP and patient reviewed appropriate ways to contact nursing with  patient stating "call bell" was most appropriate method independently. SLP and patient also reviewed location of room sign reminder to utilize call bell to receive attention of nursing staff. Throughout session, patient demonstrated significant difficulty maintaining alertness, becoming increasingly somnolent as session progressed. SLP attempted tactile and verbal cues to wake, fully upright position, and loud auditory music stimulus however patient endorsed that he had a poor night of sleep the night before and could not maintain wakefulness. SLP terminated session 2* reduced patient participation from fatigue. Patient was left in lowered bed with call bell in reach and bed alarm set. SLP will continue to target goals per plan of care.      Pain Pain Assessment Pain Scale: 0-10 Pain Score: 0-No pain  Agitated Behavior Scale: Observation Details Observation Environment: CIR Start of observation period - Date: 03/15/23 Start of observation period - Time: 0900 End of observation period - Date: 03/15/23 End of observation period - Time: 0930 Agitated Behavior Scale (DO NOT LEAVE BLANKS) Short attention span, easy distractibility, inability to concentrate: Present to a slight degree Impulsive, impatient, low tolerance for pain or frustration: Absent Uncooperative, resistant to care, demanding: Absent Violent and/or threatening violence toward people or property: Absent Explosive and/or unpredictable anger: Absent Rocking, rubbing, moaning, or other self-stimulating behavior: Absent Pulling at tubes, restraints, etc.: Absent Wandering from treatment areas: Absent Restlessness, pacing, excessive movement: Absent Repetitive behaviors, motor, and/or verbal: Absent Rapid, loud, or excessive talking: Absent Sudden changes of mood: Absent Easily initiated or excessive crying and/or laughter: Absent Self-abusiveness, physical and/or verbal: Absent Agitated behavior scale total score:  15  Therapy/Group: Individual Therapy  [name], M.A., CCC-SLP  [name] 03/15/2023, 9:35 AM

## 2023-03-15 NOTE — Progress Notes (Signed)
Occupational Therapy Session Note  Patient Details  Name: [name] [name] MRN: [id number] Date of Birth: 12-Jul-1951  Today's Date: 03/15/2023 Session 1 OT Individual Time: 2536-6440 OT Individual Time Calculation (min): 45 min   Sessin 2 OT Individual Time: 1200-1230 OT Individual Time Calculation (min): 30 min    Short Term Goals: Week 1:  OT Short Term Goal 1 (Week 1): Pt will will complete 1 step of UB dressing task OT Short Term Goal 2 (Week 1): Patient will stand with min A in preparation for BADL task OT Short Term Goal 3 (Week 1): Pt will wash two body parts with min cues  Skilled Therapeutic Interventions/Progress Updates:  Session 1   Pt greeted naked in bed yelling for help. Pt appreciative of therapist coming in to assist. +2 present from rehab tech to help manage behaviors. Pt needed maX A for bed mobility today. Once seated EOB was able to sit with CGA fading to supervision. Max A for threading pants and brief. Sit<>stand with min A of 2 for safety with Mod A for balance. Noted loose BM in bed and on bottom requiring total A for peri-care from OT, while tech assisted with standing balance. Pt then pivoted to wc w/ RW and mod A with poor safety awareness and overall very impulsive. Pt brought to the sink in wc where he initiated washing face and brushing teeth. Pt able to sequence both activities with cues for task termination. Pt set-up for breakfast and needed OT assist to cut sausage. Pt overall much more pleasant than previous session and was overall redirectable. Pt left with chair alarm on, call bell in reach, and needs met.  Pain: Pain Assessment Pain Scale: 0-10 Pain Score: 0-No pain  Session 2 Pt greeted supine in bed. OT assisted with scooting up in bed for proper bed position. PT able to assist with pulling up on bedrails. Pt declined to sit EOB for meal. HOB elevated for lunch. Lunch trey set-up and HOB elevated. OT set pt up for lunch requiring OT assist to cut  food and open containers. OT worked on dual task activity while eating lunch asking pt to recall family members and discuss things he enjoys. Pt overall pleasant. Pt left sitting upright in bed with bed alarm on, call bell in reach, and needs met.    Therapy Documentation Precautions:  Precautions Precautions: Fall Restrictions Weight Bearing Restrictions: No Pain: Pain Assessment Pain Scale: 0-10 Pain Score: 0-No pain    Therapy/Group: Individual Therapy  [name] 03/15/2023, 1:00 PM

## 2023-03-15 NOTE — Progress Notes (Signed)
Occupational Therapy Session Note  Patient Details  Name: [name] [name] MRN: [id number] Date of Birth: [birth year]  Today's Date: 03/15/2023 OT Individual Time: 1320-1430 OT Individual Time Calculation (min): 70 min    Short Term Goals: Week 1:  OT Short Term Goal 1 (Week 1): Pt will will complete 1 step of UB dressing task OT Short Term Goal 2 (Week 1): Patient will stand with min A in preparation for BADL task OT Short Term Goal 3 (Week 1): Pt will wash two body parts with min cues   Skilled Therapeutic Interventions/Progress Updates:    1:1 Pt found laying across the bed on his stomach with pelvis and legs hanging off the bed. This therapist wasn't scheduled with the therapist - just walking by. Sat with the patient and engaged in therapeutic conversation for him to allow this OT assist into a sitting position and then get into the w/c. Pt was adamant about not wanting the belt on and he "didn't want to be tied down." Engaged pt in a game of simulated darts with target on the wall with squiggs. Pt oriented to month and place. Pt remained calm throughout engagement but this therapist didn't want to leave him alone in the chair again without the belt and pt declined getting into the bed.  Pt did allow after some time to come to the RN station and sit with this therapist. After a few minutes pt reported he needed to go to the bathroom. Pt taken to the bathroom and transferred to the toilet with grab bar with min A with instructional cues. Pt did require total A for toileting. PT was able to void BM- pt reports he doesn't produce urine. Pt then needed to go to HD so pt assisted with min A back into bed with HOB elevated. Pt's HR was taken and was all over the place with labored breathing 20-70s with O2 in the 90s on RA. Discussed this with RN. Pt then taken to HD.    Therapy Documentation Precautions:  Precautions Precautions: Fall Restrictions Weight Bearing Restrictions: No  Pain: Pain  Assessment Pain Scale: 0-10 Pain Score: 0-No pain   Therapy/Group: Individual Therapy  [name] [facility] 03/15/2023, 4:00 PM

## 2023-03-15 NOTE — Progress Notes (Addendum)
[facility] Progress Note   Subjective:   Seen in room - resting with closed eyes but acknowledges me and answers questions with one word responses. Says HD went fine overnight, willing to do another HD today to get back on his usual schedule. Denies CP or dyspnea.  Objective Vitals:   03/14/23 1830 03/14/23 1904 03/14/23 2013 03/15/23 0500  BP: 113/70 (!) 111/52 128/87   Pulse: (!) 33 (!) 59 74   Resp: 18  18   Temp:  98.3 F (36.8 C) (!) 97.5 F (36.4 C)   TempSrc:  Oral    SpO2: 92% 94% (!) 74%   Weight:    106.6 kg  Height:       Physical Exam General: Well appearing man, NAD. Resting in bed. Room air. Heart: RRR; no murmur Lungs: CTA anteriorly Extremities: no LE edema Dialysis Access:  LUE AVF + bruit   Additional Objective Labs: Basic Metabolic Panel: Recent Labs  Lab 03/09/23 0951 03/10/23 0845 03/13/23 0816  NA 132* 134* 137  K 3.8 4.7 4.8  CL 96* 99 99  CO2 24 24 25   GLUCOSE 128* 152* 145*  BUN 40* 51* 48*  CREATININE 9.24* 11.16* 11.38*  CALCIUM 8.4* 9.1 8.6*  PHOS 5.1* 6.6* 6.4*   Liver Function Tests: Recent Labs  Lab 03/10/23 0845 03/13/23 0816  ALBUMIN 1.7* 1.6*   CBC: Recent Labs  Lab 03/09/23 0951 03/10/23 0845 03/13/23 0816  WBC 4.4 8.2 4.3  NEUTROABS 3.1 6.7  --   HGB 8.1* 8.5* 8.1*  HCT 26.4* 27.2* 27.5*  MCV 93.3 90.7 93.9  PLT 165 189 188   Studies/Results: EEG adult  Result Date: 03/14/2023 [name], MD     03/14/2023  3:59 PM Patient Name: [name] [name] MRN: [id number] Epilepsy Attending: [name] Referring Physician/Provider: [name], MD Date: 03/14/2023 Duration: 24.27 mins Patient history: 72yo M with ams getting eeg to evaluate for seizure Level of alertness: Awake, AEDs during EEG study: None Technical aspects: This EEG study was done with scalp electrodes positioned according to the 10-20 International system of electrode placement. Electrical activity was reviewed with band  pass filter of 1-70Hz , sensitivity of 7 uV/mm, display speed of 19mm/sec with a 60Hz  notched filter applied as appropriate. EEG data were recorded continuously and digitally stored.  Video monitoring was available and reviewed as appropriate. Description: The posterior dominant rhythm consists of 8 Hz activity of moderate voltage (25-35 uV) seen predominantly in posterior head regions, symmetric and reactive to eye opening and eye closing. EEG showed intermittent generalized  3 to 6 Hz theta-delta slowing. Hyperventilation and photic stimulation were not performed.   ABNORMALITY - Intermittent slow, generalized IMPRESSION: This study is suggestive of mild diffuse encephalopathy, nonspecific etiology. No seizures or epileptiform discharges were seen throughout the recording. Priyanka Annabelle Harman    Medications:   (feeding supplement) PROSource Plus  30 mL Oral BID BM   acidophilus  1 capsule Oral Daily   amitriptyline  50 mg Oral QHS   aspirin EC  81 mg Oral Daily   atorvastatin  80 mg Oral Daily   carvedilol  6.25 mg Oral BID WC   Chlorhexidine Gluconate Cloth  6 each Topical Q0600   Chlorhexidine Gluconate Cloth  6 each Topical Q0600   clopidogrel  75 mg Oral Daily   [START ON 03/16/2023] darbepoetin (ARANESP) injection - DIALYSIS  150 mcg Subcutaneous Q Fri-1800   divalproex  125 mg Oral Q8H  feeding supplement  237 mL Oral BID WC   Gerhardt's butt cream  1 Application Topical QID   heparin  5,000 Units Subcutaneous Q8H   insulin aspart  0-6 Units Subcutaneous TID WC   lidocaine  1 patch Transdermal Q2200   lidocaine  1 patch Transdermal Q24H   magnesium oxide  400 mg Oral Daily   mometasone-formoterol  2 puff Inhalation BID   multivitamin  1 tablet Oral QHS   pantoprazole  40 mg Oral BID   sevelamer carbonate  800 mg Oral TID WC   ziprasidone  20 mg Oral BID WC    Dialysis Orders: Previously on [facility] thru [facility] - changed to HD - will need outpatient HD unit established prior  to d/c.   Assessment/Plan: Debility: In CIR - follow. Encephalopathy/agitation: Unclear cause - present for past 1-2 weeks per wife (was happening at [facility] as well). Work-up ongoing, ammonia fine, brain MRI pending. Unable to dialyze 8/27 - was upset/yelling. Calm on 8/28 and able to be dialyzed with much issue. ESRD:  Transitioned from PD to HD recently -> usual TTS schedule. Using old AVF which is still functional, s/p f'gram 02/28/23. Unable to dialyze 8/27, did fine on 8/28 - pick back up with TTS schedule now - for HD today. Hypertension/volume: BP stable, establishing dry weight. Anemia: Hgb 8.1 - looks like scheduled to get Aranesp q Friday - will ^ next dose. Checking iron studies. Metabolic bone disease: CorrCa ok, Phos high - does not appear to have been getting binder - have started sevelamer 1/meals.  Nutrition:  Alb very low, continue supps.  Hx NSVT: On Coreg  CAD  T2DM  Recent COVID July 2024 c/b HCAP s/p antibiotic course (Unasyn) on 8/21  Recent fungemia/candida peritonitis s/p antifungal course  [name] Hoyle, PA-C 03/15/2023, 11:12 AM  [facility]   Seen and examined independently.  Agree with note and exam as documented above by physician extender and as noted here.  As above, today he has one word responses.  He is not agitated and is accepting help from staff.  He is at the nursing station awaiting dialysis.  Both today and yesterday and marked improvements from prior.   General adult male in chair in no acute distress HEENT normocephalic atraumatic extraocular movements intact sclera anicteric Neck supple trachea midline Lungs clear to auscultation bilaterally normal work of breathing at rest on room air Abdomen soft nontender nondistended Extremities no obvious edema  Psych calm mood and affect but is at nursing station  # ESRD - He was dialyzed on 8/28 and treatment went ok. (On prior attempt he was cursing and aggressive).  HD again today and  per TTS schedule   # Debility  - Per CIR team  - note also marked hypoalbuminemia    # Agitation - improved - he is at the nursing station currently but is accepting help from staff    # HTN  - acceptable control     Estanislado Emms, MD 03/15/2023 2:18 PM

## 2023-03-15 NOTE — Progress Notes (Signed)
   03/15/23 1802  Vitals  BP 104/60  BP Location Right Arm  BP Method Automatic  Patient Position (if appropriate) Lying  Pulse Rate 60  Resp 19  Oxygen Therapy  SpO2 100 %  O2 Device Nasal Cannula  During Treatment Monitoring  Intra-Hemodialysis Comments See progress note  Post Treatment  Dialyzer Clearance Lightly streaked  Hemodialysis Intake (mL) 0 mL  Liters Processed 70  Fluid Removed (mL) 2000 mL  Tolerated HD Treatment Yes  Post-Hemodialysis Comments Pt goal not met d/t pt cut off . Pt refused to completed TX, He stated : let me off the machine.CN Albert awared.  AVG/AVF Arterial Site Held (minutes) 7 minutes  AVG/AVF Venous Site Held (minutes) 7 minutes  Fistula / Graft Left Other (Comment) Arteriovenous fistula  Placement Date/Time: 08/09/16 1327   Placed prior to admission: No  Orientation: Left  Access Location: (c) Other (Comment)  Access Type: Arteriovenous fistula  Site Condition No complications  Fistula / Graft Assessment Present;Thrill;Bruit  Status Deaccessed  Needle Size 15  Drainage Description None

## 2023-03-15 NOTE — Progress Notes (Signed)
Physical Therapy Session Note  Patient Details  Name: [name] [name] MRN: [id number] Date of Birth: 05-02-1951  Today's Date: 03/15/2023 PT Individual Time: 1033-1128 PT Individual Time Calculation (min): 55 min   Short Term Goals: Week 1:  PT Short Term Goal 1 (Week 1): Pt will complete bed mobility with min assist PT Short Term Goal 2 (Week 1): Pt will complete transfers with mod assist consistently PT Short Term Goal 3 (Week 1): Pt will ambulate with LRAD mod assist 43'  Skilled Therapeutic Interventions/Progress Updates:    Pt presents in room in bed, with RN present pt laying diagonal in bed. Pt denies pain. Sesison focused on gait training for tolerance to upright mobility and dynamic postural stability.  Pt completes supine to sit with mod assist for trunk to upright and BLEs positioned on floor. Pt requires min assist for sitting EOB. Transfers with min assist and B HHA to stand, ambulates 10' with BHR min assist x2 cues for longer step legnth, min assist to turn completely to sit in [facility] as pt attempting to sit prematurely. Pt transported to day room dependently for time management and energy conservation.  Pt then ambulates with min assist x2 B HHA 20' from [facility] to mat, cues for turning completely to sit on mat, improved step length however demonstrates shorter step length and increased cadence with fatigue. Pt completes long seated rest break in unsupported sitting EOM, BLEs supported on floor, ~5 min with supervision, completed to facilitate unsupported sitting balance. [name] to verbalize that he has balance deficits and that he needs to continue working on them. Pt ambulates with min assist x1 HHA from EOM to [facility] 20' demonstrates good ability to turn to sit.  Pt requires extended seated rest break in [facility] and then ambulates 35' from [facility] to mat across room, at ~25' pt with decreasing postural stability, requires cues for slowing down gait and managing breathing prior to continuing however  pt does impulsively rush to mat to place BUEs on mat prior to turning to sit.  Pt completes stand pivot transfer CGA from mat to [facility] following seated rest break and agreeable to trial nustep BUE/BLE. Pt completes min assist stand step transfer to nustep, cues and assist for positioning once seated. Pt completes 3 minutes total activity on nustep with BUE/BLE, pt with frequent rest breaks with whole activity taking 8 minutes. Pt reports likely "rhythm and blues" music, played during activity to facilitate positive association and participation with task.  Pt returns to room and remains seated in [facility] with all needs within reach, cal light in place and chair alarm donned and activated at end of session.  Therapy Documentation Precautions:  Precautions Precautions: Fall Restrictions Weight Bearing Restrictions: No   Therapy/Group: Individual Therapy  Edwin Cap PT, DPT 03/15/2023, 12:10 PM

## 2023-03-15 NOTE — Progress Notes (Signed)
PROGRESS NOTE   Subjective/Complaints:  improved aggitation, no PRNs used, participating with therapies with some issues with attention/redirection. On exam, calm and appropriate, working in gym on ambulation. NO acute complaints, does not currently have abdominal pain.   1300 out with HD yersterday, requested it be stopped early. Patient does not remember this.   Did use PRN lyrica and oxycodone overnight; minimal pain complaints  MRI brain still pending  ROS: Per HPI above, c/b encephalopathy  Objective:   EEG adult  Result Date: 03/14/2023 [name], MD     03/14/2023  3:59 PM Patient Name: RAWN LEVERETTE MRN: [id number] Epilepsy Attending: [name] Referring Physician/Provider: [name], MD Date: 03/14/2023 Duration: 24.27 mins Patient history: 72yo M with ams getting eeg to evaluate for seizure Level of alertness: Awake, AEDs during EEG study: None Technical aspects: This EEG study was done with scalp electrodes positioned according to the 10-20 International system of electrode placement. Electrical activity was reviewed with band pass filter of 1-70Hz , sensitivity of 7 uV/mm, display speed of 10mm/sec with a 60Hz  notched filter applied as appropriate. EEG data were recorded continuously and digitally stored.  Video monitoring was available and reviewed as appropriate. Description: The posterior dominant rhythm consists of 8 Hz activity of moderate voltage (25-35 uV) seen predominantly in posterior head regions, symmetric and reactive to eye opening and eye closing. EEG showed intermittent generalized  3 to 6 Hz theta-delta slowing. Hyperventilation and photic stimulation were not performed.   ABNORMALITY - Intermittent slow, generalized IMPRESSION: This study is suggestive of mild diffuse encephalopathy, nonspecific etiology. No seizures or epileptiform discharges were seen throughout the recording. [name]   Recent Labs    03/13/23 0816  WBC 4.3  HGB 8.1*  HCT 27.5*  PLT 188   Recent Labs    03/13/23 0816  NA 137  K 4.8  CL 99  CO2 25  GLUCOSE 145*  BUN 48*  CREATININE 11.38*  CALCIUM 8.6*    Intake/Output Summary (Last 24 hours) at 03/15/2023 1020 Last data filed at 03/14/2023 1904 Gross per 24 hour  Intake 237 ml  Output 1300 ml  Net -1063 ml         Physical Exam: Vital Signs Blood pressure 128/87, pulse 74, temperature (!) 97.5 F (36.4 C), resp. rate 18, height 5\' 4"  (1.626 m), weight 106.6 kg, SpO2 (!) 74%.     Assessment/Plan: 1. Functional deficits which require 3+ hours per day of interdisciplinary therapy in a comprehensive inpatient rehab setting. Physiatrist is providing close team supervision and 24 hour management of active medical problems listed below. Physiatrist and rehab team continue to assess barriers to discharge/monitor patient progress toward functional and medical goals  Care Tool:  Bathing    Body parts bathed by patient: Right arm, Left arm, Chest, Abdomen, Front perineal area, Right upper leg, Buttocks, Left upper leg, Right lower leg, Left lower leg, Face         Bathing assist Assist Level: Total Assistance - Patient < 25% (due to pt refusing to assist)     Upper Body Dressing/Undressing Upper body dressing  Upper body assist Assist Level: Total Assistance - Patient < 25%    Lower Body Dressing/Undressing    PE: Constitution: Appropriate appearance for age. No apparent distress +Obese Resp: No respiratory distress. No accessory muscle usage. on RA and CTAB Cardio: Well perfused appearance. No peripheral edema.  Peripheral pulses 2+, regular rhythm.  + Left upper extremity fistula Abdomen: Mildly distended. TTP  LLQ, with fullness/edema around prior PD site - improved.  Hypoactive bowel sounds Psych: Calm, appropriate.   Neurologic Exam:   Awake, alert.  Oriented to self, place, and partially to time.  +  Cognitive delay - much improved! + Memory deficit - ongoing + Poor insight + Mild L ptosis  Hoffmans: Mild + LUE Sensory exam: Intact to touch in all 4 extremities Motor exam: 5/5 in all 4 extremities Coordination: Slight bilateral upper extremity tremor, intention, mild  Musculoskeletal: No joint swelling noted No abnormal tone    Lower body dressing      What is the patient wearing?: Pants     Lower body assist Assist for lower body dressing: Total Assistance - Patient < 25%     Toileting Toileting    Toileting assist Assist for toileting: 2 Helpers     Transfers Chair/bed transfer  Transfers assist     Chair/bed transfer assist level: 2 Helpers (due to behaviors)     Locomotion Ambulation   Ambulation assist      Assist level: 2 helpers Assistive device: Walker-rolling Max distance: 25'   Walk 10 feet activity   Assist     Assist level: 2 helpers Assistive device: Walker-rolling   Walk 50 feet activity   Assist Walk 50 feet with 2 turns activity did not occur: Safety/medical concerns         Walk 150 feet activity   Assist Walk 150 feet activity did not occur: Safety/medical concerns         Walk 10 feet on uneven surface  activity   Assist Walk 10 feet on uneven surfaces activity did not occur: Safety/medical concerns         Wheelchair     Assist Is the patient using a wheelchair?: Yes Type of Wheelchair: Manual    Wheelchair assist level: Dependent - Patient 0%      Wheelchair 50 feet with 2 turns activity    Assist        Assist Level: Dependent - Patient 0%   Wheelchair 150 feet activity     Assist      Assist Level: Dependent - Patient 0%   Blood pressure 128/87, pulse 74, temperature (!) 97.5 F (36.4 C), resp. rate 18, height 5\' 4"  (1.626 m), weight 106.6 kg, SpO2 (!) 74%.  Medical Problem List and Plan: 1. Functional deficits secondary to metabolic encephalopathy due to sepsis  secondary to catheter peritonitis and hypoxemia              -patient may not shower             -ELOS/Goals: 16-18 days, PT/OT Sup, SLP min A -tentative discharge date 9-9             -Continue CIR  -Will review records from [facility] -- has worsened since approximately 8/22, possible 8/19, consistent with records from nursing at [facility] with current behavior.    - 8/28: Veil bed removed by hospitalist overnight, ongoing intermittent agitation overnight; on tele currently. WOULD NOT add back soft wrist restraints d/t delirium risk, except with dialysis. If  any further violence toward staff, would add back veil bed for safety.   2.  Antithrombotics: -DVT/anticoagulation:  Pharmaceutical: Heparin             -antiplatelet therapy: aspirin and Plavix   3. Pain Management: Tylenol as needed             -Lidoderm patches to back  -Elavil 25 mg at bedtime for nerve pain and sleep 8/27  -8/29: Pain well controlled on Elavil; DC lyrica, PRN Tylenol/oxy 2.5-5 mg d/t CKD   4. Mood/Behavior/Sleep: LCSW to evaluate and provide emotional support             -continue Seroquel 50 mg TID             -clonazepam 0.5 mg TID prn             -Geodon 10 mg IM q 12 hours prn             -antipsychotic agents: n/a  -8-27: Agitated by soft restraints in bed, transition to 4 side rails, Posey alarm, and TeleSitter for now.  Veil bed on standby if needed, however feel we should avoid this if possible due to likelihood of agitating patient further   - 8/28: Placed in veil bed; wrist restraints for dialysis. Scheduled Geodon 20 mg twice daily; discontinue Seroquel and clonazepam due to no perceivable benefit. Add as needed Zyprexa 5 mg twice daily p.o. or IM due to recent cardiac arrhythmias and concern for QTc prolongation. As needed ramelteon for sleep. Stop gabapentin due to possible side effect of agitation  - 8/28: No improvement except temporarily with Geodon; add depakote 125 mg q8h for ongoing aggitation,  mood stabilization. Increase elavil to 50 mg at bedtime.  Workup ongoing.    - 8/29: EEG negative, labs normal. MRI pending. Sleeping well and calm since starting depakote and increase in elavil. Will monitor tonight and wean Geodon in AM if appropriate.    5. Neuropsych/cognition: This patient is not capable of making decisions on his own behalf   -recommend neuropsych consult   - 8/27: Ongoing aggitation; Consult hospitalist for further workup on potential contributors to encephalopathy. Pending UA, LA 2.1-> 1.4; otherwise negative so far -Delirium precautions, sleep log, Veiled bed for safety and to minimize restraints -Wife has expressed that she is not interested in palliative care at this time; hopefully with above measures patient can get dialysis in the next 48 hours to limit worsening uremic encephalopathy and medical decline    - 8/29: Aggitation much improved, able to get some dialysis last night and today. See above for medication changes. Calling wife tonight to discuss.   6. Skin/Wound Care: Routine skin care checks             -sacral skin off-loading/protection   7. Fluids/Electrolytes/Nutrition: Strict Is and Os and follow-up chemistries per nephrology             -renal diet/ 1200 cc FR  - Renvela 800 mg 3 times daily for hyperphosphatemia added per nephrology 8-27   8: Hypotension: monitor TID and prn             -continue midodrine as needed during dialysis  Normotensive, monitor    03/15/2023    5:00 AM 03/14/2023    8:13 PM 03/14/2023    7:04 PM  Vitals with BMI  Weight 235 lbs    BMI 40.32    Systolic  128 111  Diastolic  87 52  Pulse  74 59      9: Hyperlipidemia: continue statin   10: CAD s/p CABG 2023: on DAPT and statin.  - troponin elevated but stable, monitor 8/27   11: DM-2: A1c = 6.3% (at home on Lantus 22 units at bedtime, Tradjenta 5 mg daily)        -monitor PO intake             -continue SSI (0-6 units) does not appear to be requiring  coverage   - well controlled   Recent Labs    03/14/23 1136 03/14/23 2113 03/15/23 0527  GLUCAP 118* 130* 106*     12: ESRD: HD on Tues, Thurs, Sat -nephrology made aware   - Unable to tolerate 8/27 d/t aggitation, stopped early 8/28; attempting again 8/29   13: COPD/OSA; chronic home 2L O2 (Symbicort, Ventolin, prednisone 40 mg daily at home)             -continue symbicort  -Added home oxygen back 8-27   14: GERD: continue Protonix   15: Non-sustained VT/pAF: per cardiology, Dr. Algie Coffer: -continue carvedilol 6.25 mg BID; consider increasing to 12.5 mg BID before starting low dose amiodarone  -consider Eliquis and discontinue asa and Plavix in one week if no active GI bleeding and stable hemoglobin from 8/24 -Continue mag-ox to avoid hypomagnesemia -8-27: Single instance of bradycardia this morning 40s, may have been missed read, monitor through today and do not further adjust beta-blocker medications  - 8/28-29: vitals stable   16: HFrEF: Daily weights. Monitor for signs of overload  - Weight increasing slightly, however underdialyzed; continue to monitor  Filed Weights   03/14/23 0540 03/14/23 1636 03/15/23 0500  Weight: 103.4 kg 105 kg 106.6 kg      17: Liver nodularity on CT scan: LFTs reported to be normal. Avoid hepatotoxic medications      18: Epistaxis - resolved             -previously improved with flonase and saline spray   19: Morbid Obesity. Dietary counseling Body mass index is 39.01 kg/m.      LOS: 3 days A FACE TO FACE EVALUATION WAS PERFORMED  Angelina Sheriff 03/15/2023, 10:20 AM

## 2023-03-15 NOTE — Plan of Care (Signed)
  Problem: Consults Goal: RH GENERAL PATIENT EDUCATION Description: See Patient Education module for education specifics. Outcome: Progressing   Problem: RH BOWEL ELIMINATION Goal: RH STG MANAGE BOWEL WITH ASSISTANCE Description: STG Manage Bowel with mod I Assistance. Outcome: Progressing Goal: RH STG MANAGE BOWEL W/MEDICATION W/ASSISTANCE Description: STG Manage Bowel with Medication with mod I Assistance. Outcome: Progressing   Problem: RH SAFETY Goal: RH STG ADHERE TO SAFETY PRECAUTIONS W/ASSISTANCE/DEVICE Description: STG Adhere to Safety Precautions With cues Assistance/Device. Outcome: Progressing   Problem: RH PAIN MANAGEMENT Goal: RH STG PAIN MANAGED AT OR BELOW PT'S PAIN GOAL Description: < 4 with prns Outcome: Progressing   Problem: RH KNOWLEDGE DEFICIT GENERAL Goal: RH STG INCREASE KNOWLEDGE OF SELF CARE AFTER HOSPITALIZATION Description: Patient and wife will be able to manage care at discharge with educational resources independently Outcome: Progressing   Problem: Safety: Goal: Non-violent Restraint(s) Outcome: Progressing   Problem: Education: Goal: Ability to describe self-care measures that may prevent or decrease complications (Diabetes Survival Skills Education) will improve Outcome: Progressing Goal: Individualized Educational Video(s) Outcome: Progressing   Problem: Coping: Goal: Ability to adjust to condition or change in health will improve Outcome: Progressing   Problem: Fluid Volume: Goal: Ability to maintain a balanced intake and output will improve Outcome: Progressing   Problem: Health Behavior/Discharge Planning: Goal: Ability to identify and utilize available resources and services will improve Outcome: Progressing Goal: Ability to manage health-related needs will improve Outcome: Progressing   Problem: Metabolic: Goal: Ability to maintain appropriate glucose levels will improve Outcome: Progressing   Problem: Nutritional: Goal:  Maintenance of adequate nutrition will improve Outcome: Progressing Goal: Progress toward achieving an optimal weight will improve Outcome: Progressing   Problem: Skin Integrity: Goal: Risk for impaired skin integrity will decrease Outcome: Progressing   Problem: Tissue Perfusion: Goal: Adequacy of tissue perfusion will improve Outcome: Progressing

## 2023-03-16 ENCOUNTER — Encounter (HOSPITAL_COMMUNITY): Payer: Medicare Other

## 2023-03-16 ENCOUNTER — Ambulatory Visit: Payer: Medicare Other | Admitting: Cardiology

## 2023-03-16 ENCOUNTER — Inpatient Hospital Stay (HOSPITAL_COMMUNITY): Payer: Medicare Other

## 2023-03-16 DIAGNOSIS — N186 End stage renal disease: Secondary | ICD-10-CM | POA: Diagnosis not present

## 2023-03-16 DIAGNOSIS — I120 Hypertensive chronic kidney disease with stage 5 chronic kidney disease or end stage renal disease: Secondary | ICD-10-CM | POA: Diagnosis not present

## 2023-03-16 DIAGNOSIS — D631 Anemia in chronic kidney disease: Secondary | ICD-10-CM | POA: Diagnosis not present

## 2023-03-16 DIAGNOSIS — R519 Headache, unspecified: Secondary | ICD-10-CM | POA: Diagnosis not present

## 2023-03-16 DIAGNOSIS — A419 Sepsis, unspecified organism: Secondary | ICD-10-CM | POA: Diagnosis not present

## 2023-03-16 DIAGNOSIS — N250 Renal osteodystrophy: Secondary | ICD-10-CM | POA: Diagnosis not present

## 2023-03-16 DIAGNOSIS — R5381 Other malaise: Secondary | ICD-10-CM | POA: Diagnosis not present

## 2023-03-16 DIAGNOSIS — G9340 Encephalopathy, unspecified: Secondary | ICD-10-CM | POA: Diagnosis not present

## 2023-03-16 DIAGNOSIS — R4182 Altered mental status, unspecified: Secondary | ICD-10-CM | POA: Diagnosis not present

## 2023-03-16 DIAGNOSIS — Z992 Dependence on renal dialysis: Secondary | ICD-10-CM | POA: Diagnosis not present

## 2023-03-16 LAB — GLUCOSE, CAPILLARY
Glucose-Capillary: 132 mg/dL — ABNORMAL HIGH (ref 70–99)
Glucose-Capillary: 114 mg/dL — ABNORMAL HIGH (ref 70–99)
Glucose-Capillary: 139 mg/dL — ABNORMAL HIGH (ref 70–99)

## 2023-03-16 MED ORDER — ZIPRASIDONE HCL 40 MG PO CAPS
40.0000 mg | ORAL_CAPSULE | Freq: Every day | ORAL | Status: DC
  Administered 2023-03-16 – 2023-03-17 (×2): 40 mg via ORAL
  Filled 2023-03-16 (×3): qty 1

## 2023-03-16 MED ORDER — ZIPRASIDONE HCL 20 MG PO CAPS: 20.0000 mg | ORAL_CAPSULE | Freq: Two times a day (BID) | ORAL | Status: DC

## 2023-03-16 MED ORDER — LORAZEPAM 0.5 MG PO TABS: 1.0000 mg | ORAL_TABLET | Freq: Once | ORAL | Status: AC

## 2023-03-16 MED ORDER — ZIPRASIDONE HCL 20 MG PO CAPS
20.0000 mg | ORAL_CAPSULE | Freq: Every day | ORAL | Status: DC
  Filled 2023-03-16: qty 1

## 2023-03-16 MED ORDER — LORAZEPAM 2 MG/ML IJ SOLN
1.0000 mg | Freq: Once | INTRAMUSCULAR | Status: AC
  Administered 2023-03-16: 1 mg via INTRAMUSCULAR

## 2023-03-16 MED ORDER — ZIPRASIDONE HCL 40 MG PO CAPS: 40.0000 mg | ORAL_CAPSULE | Freq: Every day | ORAL | Status: DC

## 2023-03-16 MED ORDER — LORAZEPAM 2 MG/ML IJ SOLN
INTRAMUSCULAR | Status: AC
  Filled 2023-03-16: qty 1

## 2023-03-16 MED ORDER — CHLORHEXIDINE GLUCONATE CLOTH 2 % EX PADS
6.0000 | MEDICATED_PAD | Freq: Every day | CUTANEOUS | Status: DC
  Administered 2023-03-22 – 2023-03-24 (×3): 6 via TOPICAL

## 2023-03-16 NOTE — Progress Notes (Signed)
Patient attempted independent transfer from WC<>bed despite chair alarm engaged and telesitter alarm sounding. Staff responded immediately to alarm and found patient face down, unharmed in the bed. Bed alarm set, patient denies pain and fell asleep shortly thereafter.

## 2023-03-16 NOTE — Progress Notes (Signed)
Speech Language Pathology Daily Session Note  Patient Details  Name: [name] [name] MRN: [id number] Date of Birth: 1951/03/26  Today's Date: 03/16/2023 SLP Individual Time: 1029-1045 SLP Individual Time Calculation (min): 16 min  Short Term Goals: Week 1: SLP Short Term Goal 1 (Week 1): Patient will demonstrate intellectual awareness of deficits via verbally stating 2 cognitive and 2 physical impairments given maxA SLP Short Term Goal 2 (Week 1): Patient will participate in bedside swallow evaluation to determine least restrictive safe diet and need for compensatory strategies SLP Short Term Goal 3 (Week 1): Patient will utilize external aids to orient to time and situation given modA SLP Short Term Goal 4 (Week 1): Patient will recall basic daily information and events in 80% of opportunities events given maxA SLP Short Term Goal 5 (Week 1): Patient will communicate wants and needs respectfully to nursing staff utilizing provided room mechanisms (i.e. call bell) given modA SLP Short Term Goal 6 (Week 1): Patient will participate in ongoing assessment of diet tolerance with SLP to determine effects of medications and comorbidities on true swallowing function.  Skilled Therapeutic Interventions: SLP conducted skilled therapy session targeting cognitive retraining goals. Session was truncated by 29 minutes 2* patient in MRI imaging until 1029. During session, patient demonstrated improved attention, safety, judgement, and communication, making requests politely and conversing with SLP with ease. SLP introduced patient to [facility] memory strategies and provided handout to patient to facilitate carryover and recall. During session, patient requested to briefly call wife. Phone number was recited independently and accurately. Patient demonstrated improved recall of daily events, stating that he went for an MRI and went to dialysis to wife over the phone. Patient exhibits improving awareness, stating that his  thinking skills are not what they were prior to admission. Patient oriented to month and year but not date. SLP reoriented patient to external aids, which patient utilized with modI once reoriented to placement. Patient was left in lowered bed with call bell in reach and bed alarm set. SLP will continue to target goals per plan of care.      Pain Pain Assessment Pain Scale: 0-10 Pain Score: 0-No pain  Therapy/Group: Individual Therapy  [name] 03/16/2023, 10:39 AM

## 2023-03-16 NOTE — Progress Notes (Signed)
Occupational Therapy Session Note  Patient Details  Name: [name] [name] MRN: [id number] Date of Birth: 10/30/1950  Today's Date: 03/16/2023 OT Individual Time: 1050-1150 OT Individual Time Calculation (min): 60 min   Short Term Goals: Week 1:  OT Short Term Goal 1 (Week 1): Pt will will complete 1 step of UB dressing task OT Short Term Goal 2 (Week 1): Patient will stand with min A in preparation for BADL task OT Short Term Goal 3 (Week 1): Pt will wash two body parts with min cues  Skilled Therapeutic Interventions/Progress Updates:    Pt greeted trying to get to the EOB, nursing headed in and OT at the same time. OT assist to don socks, then pt completed stand-pivot to wc with min A. Nursing removed IV. Pt declined participating in bathing/dressing tasks, OT tried to get pt to shower, but he would not. OT did not want to escalate into any behaviors so did not push too hard.. Pt perseverating on needing to stay in the room as his wife was bringing him a hot fish sandwich. Pt able to be redirected and was brought to therapy gym in wc. Addressed visual scanning and problem solving with graded peg board task. Pt needed max cues initially to understand what to do. He would state simple two color pattern, then with min cues was able to replicate pattern. Progressed to more complex "x" shaped pattern with pt needing moderate cuing to problem solve. Increased time, and cues to maintain attention to task. Pt continued to perseverate on returning to room to eat his food. Worked on UB strength/conditioning with wc propulsion back to room. Pt stopped at the sink to wash hands. Spouse there with food and OT expressed that I wanted to get pt in the shower today, but he had refused. Pt left seated in wc with alarm belt on, spouse present, and needs met.   Therapy Documentation Precautions:  Precautions Precautions: Fall Restrictions Weight Bearing Restrictions: No General: General OT Amount of Missed  Time: 15 Minutes PT Missed Treatment Reason: Other (Comment) (spouse brought fish sandwich and pt refused to continue until ate his sandwich) Pain: Pain Assessment Pain Scale: 0-10 Pain Score: 0-No pain    Therapy/Group: Individual Therapy  [name] 03/16/2023, 12:35 PM

## 2023-03-16 NOTE — Progress Notes (Addendum)
Patient awoke from nap and was having vitals and blood sugar checked by nurse tech when he began to get agitated. Patient was confused that he was at his mother's house and insistent that he needs to get his blood work done.Security eventually called to patient room due to yelling and physical aggression toward RN regarding multiple attempts to exit bed. Patient stood from bed against request to remain seated and his lost balance, RN reached to steady patient and patient swung arms at RN causing him to lose his balance again landing in the wheelchair in a safe, seated position. Security arrived and coaxed patient into taking his meds;including PRN zyprexa to calm patient. Patient remained agitated although cooperative.  Patient remained seated in wheelchair with chair alarm and calmly ate dinner.

## 2023-03-16 NOTE — Progress Notes (Addendum)
[facility] Progress Note   Subjective:   Patient seen and examined in room.  Complains of swelling in LE, not able to wear his shoes.  Denies CP, SOB and orthopnea.  States he is not drinking a lot.  Appetite is good.  Denies n/v/d.  No other specific complaints.   Objective Vitals:   03/15/23 1947 03/16/23 0323 03/16/23 0500 03/16/23 1335  BP:  (!) 93/54  (!) 84/58  Pulse: 100   64  Resp: 16 15  16   Temp:  (!) 97 F (36.1 C)  (!) 97.3 F (36.3 C)  TempSrc:  Oral    SpO2:    96%  Weight:   105 kg   Height:       Physical Exam General:well appearing in NAD, +periorbital edema Heart:RRR, no mrg Lungs:CTAB, nml WOB Abdomen:soft, NTND Extremities:no LE edema Dialysis Access: LU AVF +b/t   Filed Weights   03/15/23 0500 03/15/23 1811 03/16/23 0500  Weight: 106.6 kg 104.6 kg 105 kg    Intake/Output Summary (Last 24 hours) at 03/16/2023 1438 Last data filed at 03/15/2023 1900 Gross per 24 hour  Intake 236 ml  Output 2000 ml  Net -1764 ml    Additional Objective Labs: Basic Metabolic Panel: Recent Labs  Lab 03/10/23 0845 03/13/23 0816 03/15/23 1330  NA 134* 137 133*  K 4.7 4.8 4.5  CL 99 99 96*  CO2 24 25 24   GLUCOSE 152* 145* 136*  BUN 51* 48* 48*  CREATININE 11.16* 11.38* 10.85*  CALCIUM 9.1 8.6* 8.4*  PHOS 6.6* 6.4* 5.4*   Liver Function Tests: Recent Labs  Lab 03/10/23 0845 03/13/23 0816 03/15/23 1330  ALBUMIN 1.7* 1.6* 1.6*   CBC: Recent Labs  Lab 03/10/23 0845 03/13/23 0816 03/15/23 1330  WBC 8.2 4.3 4.6  NEUTROABS 6.7  --   --   HGB 8.5* 8.1* 7.5*  HCT 27.2* 27.5* 24.5*  MCV 90.7 93.9 91.8  PLT 189 188 161   CBG: Recent Labs  Lab 03/15/23 0527 03/15/23 1155 03/15/23 1834 03/15/23 2032 03/16/23 1214  GLUCAP 106* 133* 96 102* 132*   Iron Studies:  Recent Labs    03/15/23 1330  IRON 94  TIBC 248*  FERRITIN 2,175*   Lab Results  Component Value Date   INR 1.0 03/08/2023   INR 1.1 03/03/2023   INR 1.2  02/18/2023   Studies/Results: MR BRAIN WO CONTRAST  Result Date: 03/16/2023 CLINICAL DATA:  72 year old male with recent sepsis, pain, encephalopathy. COVID-19. Altered mental status. EXAM: MRI HEAD WITHOUT CONTRAST TECHNIQUE: Multiplanar, multiecho pulse sequences of the brain and surrounding structures were obtained without intravenous contrast. COMPARISON:  Head CT 02/15/2023. FINDINGS: Brain: Stable cerebral volume. No convincing restricted diffusion to suggest acute infarction. No midline shift, mass effect, evidence of mass lesion, ventriculomegaly, extra-axial collection or acute intracranial hemorrhage. Cervicomedullary junction and pituitary are within normal limits. Patchy and confluent bilateral cerebral white matter T2 and FLAIR hyperintensity, especially in the periventricular white matter. No cortical encephalomalacia. However, chronic microhemorrhage in the right thalamus, and also suggestion of chronic hemosiderin in the left superior frontal gyrus on SWI (series 7, image 77). Otherwise the deep gray nuclei, brainstem and cerebellum appear negative for age. Vascular: Major intracranial vascular flow voids are preserved. Skull and upper cervical spine: Chronic right lamina papyracea fracture. Otherwise negative. Visualized bone marrow signal is within normal limits. Sinuses/Orbits: Postoperative changes to both globes. Mild and chronic appearing left frontal sinus opacification. Other paranasal sinuses are stable and well  aerated. Other: Trace mastoid fluid bilaterally. Negative visible nasopharynx. Grossly normal other visible internal auditory structures. Negative visible scalp and face. IMPRESSION: 1. No acute intracranial abnormality. 2. Moderate for age white matter signal changes with occasional chronic microhemorrhages, most commonly due to chronic small vessel disease. Electronically Signed   By: Odessa Fleming M.D.   On: 03/16/2023 11:09    Medications:   (feeding supplement) PROSource Plus   30 mL Oral BID BM   acidophilus  1 capsule Oral Daily   amitriptyline  50 mg Oral QHS   aspirin EC  81 mg Oral Daily   atorvastatin  80 mg Oral Daily   carvedilol  6.25 mg Oral BID WC   Chlorhexidine Gluconate Cloth  6 each Topical Q0600   Chlorhexidine Gluconate Cloth  6 each Topical Q0600   clopidogrel  75 mg Oral Daily   darbepoetin (ARANESP) injection - DIALYSIS  150 mcg Subcutaneous Q Fri-1800   divalproex  125 mg Oral Q8H   feeding supplement  237 mL Oral BID WC   Gerhardt's butt cream  1 Application Topical QID   heparin  5,000 Units Subcutaneous Q8H   insulin aspart  0-6 Units Subcutaneous TID WC   lidocaine  1 patch Transdermal Q2200   lidocaine  1 patch Transdermal Q24H   magnesium oxide  400 mg Oral Daily   mometasone-formoterol  2 puff Inhalation BID   multivitamin  1 tablet Oral QHS   pantoprazole  40 mg Oral BID   sevelamer carbonate  800 mg Oral TID WC   [START ON 03/17/2023] ziprasidone  20 mg Oral Q breakfast   And   ziprasidone  40 mg Oral Q supper    Dialysis Orders: Previously on CCPD thru [facility] - changed to HD - will need outpatient HD unit established prior to d/c.   Assessment/Plan: Debility: In CIR - follow. Encephalopathy/agitation: Agitation appears better today. Unclear cause - present for past 1-2 weeks per wife (was happening at [facility] as well). Work-up ongoing, ammonia fine, brain MRI with no acute findings. Unable to dialyze 8/27 - was upset/yelling. Calm on 8/28 and able to be dialyzed with much issue. ESRD:  Transitioned from PD to HD recently -> usual TTS schedule. Using old AVF which is still functional, s/p f'gram 02/28/23. Unable to dialyze 8/27, did fine on 8/28 - pick back up with TTS schedule now - for HD tomorrow.  Hypertension/volume: BP stable.  LE edema noted on exam.  No respiratory issues.  Plan to increase UF goal to improve edema.  Working to establish dry weight. Reinforced fluid restrictions.  Anemia: Hgb 7.5, tsat 38%  with ferritin 2175.  No iron.  Increased aranesp to for dose today.  Metabolic bone disease: CorrCa and phos in goal.  On Renvela 1 AC TID.  Nutrition:  Alb very low, continue supps.  Renal diet w/fluid restrictions.   Hx NSVT: On Coreg  CAD  T2DM  Recent COVID July 2024 c/b HCAP s/p antibiotic course (Unasyn) on 8/21  Recent fungemia/candida peritonitis s/p antifungal course   Virgina Norfolk, PA-C [facility] 03/16/2023,2:38 PM  LOS: 4 days    Seen and examined independently.  Agree with note and exam as documented above by physician extender and as noted here.  Resting on my arrival.  Laying on his arm and doesn't want to move so that I can assess his fistula "there ain't nothing wrong with my fistula".  HD SW has reached about about getting him  an outpatient HD unit.   General adult male in bed in no acute distress HEENT normocephalic atraumatic extraocular movements intact sclera anicteric Neck supple trachea midline Lungs clear to auscultation bilaterally normal work of breathing at rest on room air Cardiovascular S1S2 no rub Abdomen soft nontender nondistended Extremities no obvious edema  Psych no anxiety or agitation Neuro asleep on arrival; wakes to voice and exam and answers questions    # ESRD - He was dialyzed on 8/28 and treatment went ok. (On prior attempt he was cursing and aggressive).  HD is per TTS schedule   # Debility  - Per CIR team  - note also marked hypoalbuminemia    # Agitation - improved - last two HD sessions have gone ok.  accepting help from staff    # HTN  - acceptable control   Estanislado Emms, MD 03/16/2023 4:30 PM

## 2023-03-16 NOTE — Progress Notes (Signed)
03/16/23 2027  What Happened  Was fall witnessed? Yes  Who witnessed fall? [place], lpn  Patients activity before fall ambulating-assisted;to/from bed, chair, or stretcher  Point of contact buttocks  Was patient injured? No  Patient found other (Comment) (nurse with patient)  Found by Staff-comment  Stated prior activity to/from bed, chair, or stretcher  Provider Notification  Provider Name/Title mercedes street, PA  Date Provider Notified 03/16/23  Time Provider Notified 2020  Method of Notification Call  Notification Reason Fall  Date of Provider Response 03/16/23  Time of Provider Response 2020  Follow Up  Family notified Yes - comment (mary Mealey)  Time family notified 2019  Additional tests No  Progress note created (see row info) Yes  Adult Fall Risk Assessment  Risk Factor Category (scoring not indicated) Fall has occurred during this admission (document High fall risk)  Age 72  Fall History: Fall within 6 months prior to admission 5  Elimination; Bowel and/or Urine Incontinence 0  Elimination; Bowel and/or Urine Urgency/Frequency 0  Medications: includes PCA/Opiates, Anti-convulsants, Anti-hypertensives, Diuretics, Hypnotics, Laxatives, Sedatives, and Psychotropics 5  Patient Care Equipment 1  Mobility-Assistance 2  Mobility-Gait 2  Mobility-Sensory Deficit 0  Altered awareness of immediate physical environment 1  Impulsiveness 2  Lack of understanding of one's physical/cognitive limitations 4  Total Score 24  Patient Fall Risk Level High fall risk  Adult Fall Risk Interventions  Required Bundle Interventions *See Row Information* High fall risk - low, moderate, and high requirements implemented  Additional Interventions Use of appropriate toileting equipment (bedpan, BSC, etc.);Lap belt while in chair/wheelchair (Rehab only);Camera surveillance (with patient/family notification & education)  Screening for Fall Injury Risk (To be completed on HIGH fall  risk patients) - Assessing Need for Floor Mats  Risk For Fall Injury- Criteria for Floor Mats Confusion/dementia (+NuDESC, CIWA, TBI, etc.)  Will Implement Floor Mats Yes  Vitals  Temp 97.9 F (36.6 C)  Temp Source Axillary  BP (!) 86/60  MAP (mmHg) 69  BP Location Right Arm  BP Method Automatic  Patient Position (if appropriate) Lying  Pulse Rate (!) 51  Pulse Rate Source Dinamap  Resp 16  Oxygen Therapy  SpO2 95 %  O2 Device Room Air  Pain Assessment  Pain Scale 0-10  Pain Score 0  PAINAD (Pain Assessment in Advanced Dementia)  Breathing 0  Negative Vocalization 0  Facial Expression 0  Body Language 0  Consolability 0  PAINAD Score 0  Neurological  Neuro (WDL) X  Level of Consciousness Alert  Orientation Level Oriented to person  Cognition Poor attention/concentration;Poor judgement;Poor safety awareness;Memory impairment;Impulsive  Speech Clear  Motor Function/Sensation Assessment Grip;Motor response;Motor strength  R Hand Grip Moderate  L Hand Grip Moderate  RUE Motor Response Purposeful movement  RUE Motor Strength 4  LUE Motor Response Purposeful movement  LUE Motor Strength 4  RLE Motor Response Purposeful movement  RLE Motor Strength 4  LLE Motor Response Purposeful movement  LLE Motor Strength 4  Neuro Symptoms Agitation  Neuro symptoms relieved by Rest;Anti-anxiety medication  Tremors  Tremor Location Hands  Tremor Severity Mild  Glasgow Coma Scale  Eye Opening 4  Best Verbal Response (NON-intubated) 4  Best Motor Response 6  Glasgow Coma Scale Score 14  Musculoskeletal  Musculoskeletal (WDL) X  Assistive Device BSC;Stedy  Generalized Weakness Yes  Weight Bearing Restrictions No  Integumentary  Integumentary (WDL) WDL  Skin Color Appropriate for ethnicity  Skin Condition Dry  Skin Integrity Intact  Skin Turgor  Non-tenting

## 2023-03-16 NOTE — Progress Notes (Signed)
Pt's case discussed with nephrologist and [facility] today. Pt will likely have a TTS 10:30 am chair time at d/c from rehab. Will follow and assist as needed.   Olivia Canter Renal Navigator [phone]

## 2023-03-16 NOTE — IPOC Note (Signed)
Overall Plan of Care [facility]) Patient Details Name: [name] [name] MRN: [id number] DOB: 24-Oct-1950  Admitting Diagnosis: Acute metabolic encephalopathy  Hospital Problems: Principal Problem:   Acute metabolic encephalopathy Active Problems:   Type 2 diabetes mellitus with chronic kidney disease on chronic dialysis, with long-term current use of insulin (HCC)   HFrEF (heart failure with reduced ejection fraction) (HCC)   Morbid obesity (HCC)   Encephalopathy   Epistaxis   PAF (paroxysmal atrial fibrillation) (HCC)   Long term (current) use of insulin (HCC)   ESRD on dialysis [facility])   Debility     Functional Problem List: Nursing Endurance, Medication Management, Safety, Bowel, Pain  PT Behavior, Balance, Endurance, Motor, Pain, Perception, Safety  OT Balance, Behavior, Cognition, Endurance, Motor, Pain, Perception, Safety  SLP Cognition, Behavior, Safety  TR         Basic ADL's: OT Eating, Bathing, Grooming, Dressing, Toileting     Advanced  ADL's: OT       Transfers: PT Bed Mobility, Bed to Chair, Customer service manager, Tub/Shower     Locomotion: PT Ambulation, Stairs, Wheelchair Mobility     Additional Impairments: OT None  SLP Social Cognition   Social Interaction, Problem Solving, Memory, Attention, Awareness  TR      Anticipated Outcomes Item Anticipated Outcome  Self Feeding Supervision  Swallowing  supervision   Basic self-care  Supervision/min A  Toileting  Min A   Bathroom Transfers Min A  Bowel/Bladder  manage bowel w mod I assist  Transfers  Min assist with LRAD  Locomotion  min assist with LRAD ambulatory  Communication  minA  Cognition     Pain  n/a  Safety/Judgment  manage safety w cues   Therapy Plan: PT Intensity: Minimum of 1-2 x/day ,45 to 90 minutes PT Frequency: Total of 15 hours over 7 days of combined therapies PT Duration Estimated Length of Stay: 2 weeks OT Intensity: Minimum of 1-2 x/day, 45 to 90 minutes OT Frequency: 5  out of 7 days OT Duration/Estimated Length of Stay: 12-14 SLP Intensity: Minumum of 1-2 x/day, 30 to 90 minutes SLP Frequency: 3 to 5 out of 7 days SLP Duration/Estimated Length of Stay: 9/9   Team Interventions: Nursing Interventions Patient/Family Education, Bowel Management, Disease Management/Prevention, Medication Management, Discharge Planning  PT interventions Ambulation/gait training, Community reintegration, DME/adaptive equipment instruction, Psychosocial support, Stair training, Neuromuscular re-education, Wheelchair propulsion/positioning, UE/LE Strength taining/ROM, Warden/ranger, Discharge planning, Pain management, Therapeutic Activities, UE/LE Coordination activities, Cognitive remediation/compensation, Functional mobility training, Disease management/prevention, Patient/family education, Therapeutic Exercise, Visual/perceptual remediation/compensation  OT Interventions Warden/ranger, Cognitive remediation/compensation, Community reintegration, Discharge planning, Disease mangement/prevention, DME/adaptive equipment instruction, Functional electrical stimulation, Functional mobility training, Neuromuscular re-education, Pain management, Patient/family education, Psychosocial support, Self Care/advanced ADL retraining, Skin care/wound managment, Splinting/orthotics, Therapeutic Activities, Therapeutic Exercise, UE/LE Strength taining/ROM, UE/LE Coordination activities, Visual/perceptual remediation/compensation, Wheelchair propulsion/positioning  SLP Interventions Cognitive remediation/compensation, Environmental controls, Cueing hierarchy, Functional tasks, Therapeutic Activities, Internal/external aids, Patient/family education  TR Interventions    SW/CM Interventions Discharge Planning, Psychosocial Support, Patient/Family Education   Barriers to Discharge MD  Medical stability, Home enviroment access/loayout, Lack of/limited family support, Insurance for  SNF coverage, Weight, Hemodialysis, Medication compliance, Behavior, and Nutritional means  Nursing Decreased caregiver support, Home environment access/layout, Hemodialysis PD PTA [facility]; now HD T, H, Sa, w wife  PT Decreased caregiver support, Inaccessible home environment, Behavior 1 STE, needs 2 person assist for all mobility due to behavior at this time  OT Behavior Agitation  SLP  Hemodialysis, Behavior    SW Decreased caregiver support, Lack of/limited family support, Insurance for SNF coverage, Other (comments) behaviors   Team Discharge Planning: Destination: PT-Home ,OT- Home , SLP-Home Projected Follow-up: PT-Home health PT, OT-  24 hour supervision/assistance, SLP-Home Health SLP, 24 hour supervision/assistance Projected Equipment Needs: PT-Rolling walker with 5" wheels, OT- To be determined, SLP-None recommended by SLP Equipment Details: PT- , OT-  Patient/family involved in discharge planning: PT- Patient,  OT-Patient, SLP-Patient unable/family or caregive not available  MD ELOS: 14-16 days Medical Rehab Prognosis:  Good Assessment: The patient has been admitted for CIR therapies with the diagnosis of metabolic encephalopathy. The team will be addressing functional mobility, strength, stamina, balance, safety, adaptive techniques and equipment, self-care, bowel and bladder mgt, patient and caregiver education, and behavioral planning. Goals have been set at supervision PT/OT, Min A SLP. Anticipated discharge destination is home.       See Team Conference Notes for weekly updates to the plan of care

## 2023-03-16 NOTE — Progress Notes (Signed)
Patient became agitated again at approximately 1805 wanting to leave hospital to get his blood work done. Security had to be called due to yelling and attempts to get out of chair. Security and Police arrived;pt insistent on leaving AMA however patient is not able to make appropriate medical decisions according to MD note therefore charge nurse called on call PA for PRN sedative. Ativan IM 1mg  was ordered and administered by this RN with eventual compliance of patient. Patient is calm and sitting in wheelchair as requested. Chair alarm is on and telesitter active.

## 2023-03-16 NOTE — Progress Notes (Signed)
NT reports he entered room to take blood glucose and patient was already eating a meal brought in from wife. NT requested to take BG and patient refused stating NT could take after he finished eating.

## 2023-03-16 NOTE — Progress Notes (Signed)
Patient verbal disruptive throughout shift and non compliance with treatments. Patient reoriented and redirected throughout shift. Patient remains undirectable and continuous to tell staff and writer to leave the room.  Patient approached calming and redirected several times. Patient refused multiple treatments and asked Clinical research associate and staff to leave room. Writer and staff continuous to round on the patient as needed and multiple times throughout the shift. Patient educated on importance of safety, bed alarm and importance of complying with treatments. Bed alarm on and intact and tele sitter in use.

## 2023-03-16 NOTE — Progress Notes (Signed)
PROGRESS NOTE   Subjective/Complaints:  Overnight, nursing documented patient disrupted and impulsive throughout shift.  Appears he had as needed Tylenol, oxycodone, and Zyprexa all administered at approximately the same time around 5 AM.  No reported events of getting out of bed, falls.  Sleep log not performed  Per dayshift nursing, over the last 2 days patient has been extremely calm, appropriate, and redirectable.  Able to complete MRI this a.m., which shows chronic microvascular changes but no acute abnormalities.  On a.m. exam, patient is pleasant, appropriate.  He endorses some mild nausea this morning, but otherwise no complaints.  Has been able to eat.  Endorses having bowel movements.  No current pain.  He believes he slept well.  ROS: Per HPI above, c/b encephalopathy  Objective:   MR BRAIN WO CONTRAST  Result Date: 03/16/2023 CLINICAL DATA:  72 year old male with recent sepsis, pain, encephalopathy. COVID-19. Altered mental status. EXAM: MRI HEAD WITHOUT CONTRAST TECHNIQUE: Multiplanar, multiecho pulse sequences of the brain and surrounding structures were obtained without intravenous contrast. COMPARISON:  Head CT 02/15/2023. FINDINGS: Brain: Stable cerebral volume. No convincing restricted diffusion to suggest acute infarction. No midline shift, mass effect, evidence of mass lesion, ventriculomegaly, extra-axial collection or acute intracranial hemorrhage. Cervicomedullary junction and pituitary are within normal limits. Patchy and confluent bilateral cerebral white matter T2 and FLAIR hyperintensity, especially in the periventricular white matter. No cortical encephalomalacia. However, chronic microhemorrhage in the right thalamus, and also suggestion of chronic hemosiderin in the left superior frontal gyrus on SWI (series 7, image 77). Otherwise the deep gray nuclei, brainstem and cerebellum appear negative for age. Vascular:  Major intracranial vascular flow voids are preserved. Skull and upper cervical spine: Chronic right lamina papyracea fracture. Otherwise negative. Visualized bone marrow signal is within normal limits. Sinuses/Orbits: Postoperative changes to both globes. Mild and chronic appearing left frontal sinus opacification. Other paranasal sinuses are stable and well aerated. Other: Trace mastoid fluid bilaterally. Negative visible nasopharynx. Grossly normal other visible internal auditory structures. Negative visible scalp and face. IMPRESSION: 1. No acute intracranial abnormality. 2. Moderate for age white matter signal changes with occasional chronic microhemorrhages, most commonly due to chronic small vessel disease. Electronically Signed   By: Odessa Fleming M.D.   On: 03/16/2023 11:09   EEG adult  Result Date: 03/14/2023 [name], MD     03/14/2023  3:59 PM Patient Name: [name] [name] MRN: [id number] Epilepsy Attending: [name] Referring Physician/Provider: [name], MD Date: 03/14/2023 Duration: 24.27 mins Patient history: 72yo M with ams getting eeg to evaluate for seizure Level of alertness: Awake, AEDs during EEG study: None Technical aspects: This EEG study was done with scalp electrodes positioned according to the 10-20 International system of electrode placement. Electrical activity was reviewed with band pass filter of 1-70Hz , sensitivity of 7 uV/mm, display speed of 73mm/sec with a 60Hz  notched filter applied as appropriate. EEG data were recorded continuously and digitally stored.  Video monitoring was available and reviewed as appropriate. Description: The posterior dominant rhythm consists of 8 Hz activity of moderate voltage (25-35 uV) seen predominantly in posterior head regions, symmetric and reactive to eye opening and eye closing.  EEG showed intermittent generalized  3 to 6 Hz theta-delta slowing. Hyperventilation and photic stimulation were not performed.   ABNORMALITY -  Intermittent slow, generalized IMPRESSION: This study is suggestive of mild diffuse encephalopathy, nonspecific etiology. No seizures or epileptiform discharges were seen throughout the recording. [name]   Recent Labs    03/15/23 1330  WBC 4.6  HGB 7.5*  HCT 24.5*  PLT 161   Recent Labs    03/15/23 1330  NA 133*  K 4.5  CL 96*  CO2 24  GLUCOSE 136*  BUN 48*  CREATININE 10.85*  CALCIUM 8.4*    Intake/Output Summary (Last 24 hours) at 03/16/2023 1229 Last data filed at 03/15/2023 1900 Gross per 24 hour  Intake 236 ml  Output 2000 ml  Net -1764 ml         Physical Exam: Vital Signs Blood pressure (!) 93/54, pulse 100, temperature (!) 97 F (36.1 C), temperature source Oral, resp. rate 15, height 5\' 4"  (1.626 m), weight 105 kg, SpO2 100%.     Assessment/Plan: 1. Functional deficits which require 3+ hours per day of interdisciplinary therapy in a comprehensive inpatient rehab setting. Physiatrist is providing close team supervision and 24 hour management of active medical problems listed below. Physiatrist and rehab team continue to assess barriers to discharge/monitor patient progress toward functional and medical goals  Care Tool:  Bathing    Body parts bathed by patient: Right arm, Left arm, Chest, Abdomen, Front perineal area, Right upper leg, Buttocks, Left upper leg, Right lower leg, Left lower leg, Face         Bathing assist Assist Level: Total Assistance - Patient < 25% (due to pt refusing to assist)     Upper Body Dressing/Undressing Upper body dressing        Upper body assist Assist Level: Total Assistance - Patient < 25%    Lower Body Dressing/Undressing    PE: Constitution: Appropriate appearance for age. No apparent distress +Obese.  Sitting up in bed. Resp: No respiratory distress. No accessory muscle usage. on RA and CTAB Cardio: Well perfused appearance. No peripheral edema.    + Left upper extremity fistula Abdomen: Mildly  distended. TTP  LLQ, with fullness/edema around prior PD site - improved.  Hypoactive bowel sounds Psych: Calm, appropriate.  Somewhat lethargic.  Neurologic Exam:   Awake, alert.  Oriented to self, place, and time fully today! + Cognitive delay -ongoing, affected by lethargy! + Memory deficit - ongoing + Poor insight + Mild L ptosis   Sensory exam: Intact to touch in all 4 extremities Motor exam: 5/5 in all 4 extremities Coordination: Slight bilateral upper extremity tremor, intention, mild  Musculoskeletal: No joint swelling noted No abnormal tone    Lower body dressing      What is the patient wearing?: Pants     Lower body assist Assist for lower body dressing: Total Assistance - Patient < 25%     Toileting Toileting    Toileting assist Assist for toileting: 2 Helpers     Transfers Chair/bed transfer  Transfers assist     Chair/bed transfer assist level: Minimal Assistance - Patient > 75%     Locomotion Ambulation   Ambulation assist      Assist level: Minimal Assistance - Patient > 75% Assistive device: Hand held assist Max distance: 35'   Walk 10 feet activity   Assist     Assist level: Minimal Assistance - Patient > 75% Assistive device: Hand held assist  Walk 50 feet activity   Assist Walk 50 feet with 2 turns activity did not occur: Safety/medical concerns         Walk 150 feet activity   Assist Walk 150 feet activity did not occur: Safety/medical concerns         Walk 10 feet on uneven surface  activity   Assist Walk 10 feet on uneven surfaces activity did not occur: Safety/medical concerns         Wheelchair     Assist Is the patient using a wheelchair?: Yes Type of Wheelchair: Manual    Wheelchair assist level: Dependent - Patient 0%      Wheelchair 50 feet with 2 turns activity    Assist        Assist Level: Dependent - Patient 0%   Wheelchair 150 feet activity     Assist       Assist Level: Dependent - Patient 0%   Blood pressure (!) 93/54, pulse 100, temperature (!) 97 F (36.1 C), temperature source Oral, resp. rate 15, height 5\' 4"  (1.626 m), weight 105 kg, SpO2 100%.  Medical Problem List and Plan: 1. Functional deficits secondary to metabolic encephalopathy due to sepsis secondary to catheter peritonitis and hypoxemia              -patient may not shower             -ELOS/Goals: 16-18 days, PT/OT Sup, SLP min A -tentative discharge date 9-9             -Continue CIR  -Will review records from [facility] -- has worsened since approximately 8/22, possible 8/19, consistent with records from nursing at [facility] with current behavior.    - 8/28: Veil bed removed by hospitalist overnight, ongoing intermittent agitation overnight; on tele currently. WOULD NOT add back soft wrist restraints d/t delirium risk, except with dialysis. If any further violence toward staff, would add back veil bed for safety.   2.  Antithrombotics: -DVT/anticoagulation:  Pharmaceutical: Heparin             -antiplatelet therapy: aspirin and Plavix   3. Pain Management: Tylenol as needed             -Lidoderm patches to back  -Elavil 25 mg at bedtime for nerve pain and sleep 8/27  -8/29: Pain well controlled on Elavil; DC lyrica, PRN Tylenol/oxy 2.5-5 mg d/t CKD  8-30: Appears patient has been getting pain medications together overnight along with antipsychotics, will discuss with nursing   4. Mood/Behavior/Sleep: LCSW to evaluate and provide emotional support             -continue Seroquel 50 mg TID             -clonazepam 0.5 mg TID prn             -Geodon 10 mg IM q 12 hours prn             -antipsychotic agents: n/a  -8-27: Agitated by soft restraints in bed, transition to 4 side rails, Posey alarm, and TeleSitter for now.  Veil bed on standby if needed, however feel we should avoid this if possible due to likelihood of agitating patient further   - 8/28: Placed in veil bed;  wrist restraints for dialysis. Scheduled Geodon 20 mg twice daily; discontinue Seroquel and clonazepam due to no perceivable benefit. Add as needed Zyprexa 5 mg twice daily p.o. or IM due to recent cardiac arrhythmias and concern for  QTc prolongation. As needed ramelteon for sleep. Stop gabapentin due to possible side effect of agitation  - 8/28: No improvement except temporarily with Geodon; add depakote 125 mg q8h for ongoing aggitation, mood stabilization. Increase elavil to 50 mg at bedtime.  Workup ongoing.    - 8/29: EEG negative, labs normal. MRI pending. Sleeping well and calm since starting depakote and increase in elavil. Will monitor tonight and wean Geodon in AM if appropriate.   8-30: MRI showing chronic microvascular changes, otherwise normal.  Increasingly calm and appropriate during the day, with ongoing agitation at nighttime.  Will increase nighttime Geodon to 40 mg; will discuss sleep log with nursing, as it has not been performed.   5. Neuropsych/cognition: This patient is not capable of making decisions on his own behalf   -recommend neuropsych consult   - 8/27: Ongoing aggitation; Consult hospitalist for further workup on potential contributors to encephalopathy. Pending UA, LA 2.1-> 1.4; otherwise negative so far -Delirium precautions, sleep log, Veiled bed for safety and to minimize restraints -Wife has expressed that she is not interested in palliative care at this time; hopefully with above measures patient can get dialysis in the next 48 hours to limit worsening uremic encephalopathy and medical decline    - 8/29: Aggitation much improved, able to get some dialysis last night and today. See above for medication changes. Calling wife tonight to discuss.   8-30: Still with moderate cognitive deficits, however appears once patient is less agitated he is better able to concentrate and has better orientation.  MRI negative for acute findings, monitor.  6. Skin/Wound Care: Routine  skin care checks             -sacral skin off-loading/protection   7. Fluids/Electrolytes/Nutrition: Strict Is and Os and follow-up chemistries per nephrology             -renal diet/ 1200 cc FR  - Renvela 800 mg 3 times daily for hyperphosphatemia added per nephrology 8-27   8: Hypotension: monitor TID and prn             -continue midodrine as needed during dialysis  Normotensive, monitor    03/16/2023    5:00 AM 03/16/2023    3:23 AM 03/15/2023    7:47 PM  Vitals with BMI  Weight 231 lbs 8 oz    BMI 39.71    Systolic  93   Diastolic  54   Pulse   100      9: Hyperlipidemia: continue statin   10: CAD s/p CABG 2023: on DAPT and statin.  - troponin elevated but stable, monitor 8/27   11: DM-2: A1c = 6.3% (at home on Lantus 22 units at bedtime, Tradjenta 5 mg daily)        -monitor PO intake             -continue SSI (0-6 units) does not appear to be requiring coverage   - well controlled   Recent Labs    03/15/23 1834 03/15/23 2032 03/16/23 1214  GLUCAP 96 102* 132*     12: ESRD: HD on Tues, Thurs, Sat -nephrology made aware   - Unable to tolerate 8/27 d/t aggitation, stopped early 8/28;  - attempting again 8/29, stopped early again   13: COPD/OSA; chronic home 2L O2 (Symbicort, Ventolin, prednisone 40 mg daily at home)             -continue symbicort  -Added home oxygen back 8-27   14: GERD: continue  Protonix   15: Non-sustained VT/pAF: per cardiology, Dr. Algie Coffer: -continue carvedilol 6.25 mg BID; consider increasing to 12.5 mg BID before starting low dose amiodarone  -consider Eliquis and discontinue asa and Plavix in one week if no active GI bleeding and stable hemoglobin from 8/24 -Continue mag-ox to avoid hypomagnesemia -8-27: Single instance of bradycardia this morning 40s, may have been missed read, monitor through today and do not further adjust beta-blocker medications  - 8/28-29: vitals stable   16: HFrEF: Daily weights. Monitor for signs of  overload  - Weight increasing slightly, however underdialyzed; continue to monitor; seem to stabilized  Filed Weights   03/15/23 0500 03/15/23 1811 03/16/23 0500  Weight: 106.6 kg 104.6 kg 105 kg      17: Liver nodularity on CT scan: LFTs reported to be normal. Avoid hepatotoxic medications      18: Epistaxis - resolved             -previously improved with flonase and saline spray   19: Morbid Obesity. Dietary counseling Body mass index is 39.01 kg/m.      LOS: 4 days A FACE TO FACE EVALUATION WAS PERFORMED  Angelina Sheriff 03/16/2023, 12:29 PM

## 2023-03-16 NOTE — Progress Notes (Signed)
Physical Therapy Progress Note  Patient Details  Name: [name] [name] MRN: [id number] Date of Birth: 12-04-1950  Today's Date: 03/16/2023 PT Individual Time: 0802-0842 PT Individual Time Calculation (min): 40 min   Short Term Goals: Week 1:  PT Short Term Goal 1 (Week 1): Pt will complete bed mobility with min assist PT Short Term Goal 2 (Week 1): Pt will complete transfers with mod assist consistently PT Short Term Goal 3 (Week 1): Pt will ambulate with LRAD mod assist 57'  Skilled Therapeutic Interventions/Progress Updates:    Pt presents in room in [facility], agreeable to PT. Pt states needing to use restroom at start of session. Session focused on participation with self care tasks as well as gait training and monitoring vitals.  Pt ambulates to bathroom  ~20'  navigating narrow spaces and obstacles with min assist R HHA, pt reaching with LUE to grab onto objects during ambulation for stability. Pt with improved positioning prior to transfer. Pt requires max assist for doffing pants while in standing, min assist for eccentric lowering phase. Pt remains seated on toilet with supervision due to impulsivity, able to complete periarea hygiene in sitting with supervision. Pt requires min assist for donning pants in standing, ambulates with min assist no HHA to sink to complete hand hygiene with CGA and mod verbal cues for sequencing hand hygiene. Pt sits to standard WC for seated rest break. Pt reports increased fatigued and feeling "winded" this session, vitals monitored and SpO2 >90% with activity and rest on room air.  Pt transported from room to day room dependently in [facility] for energy conservation. During transport pt receives breakfast, placed in his room. Pt agreeable to one trial ambulation before returning to eat breakfast. Pt ambulates with RUE HHA min assist 30' with two 180* turns.  Pt returns to room and remains seated in [facility] with all needs within reach, set up for breakfast, cal light in  place and chair alarm donned and activated at end of session. Pt missing 35 out of 75 minute session due to breakfast.   Therapy Documentation Precautions:  Precautions Precautions: Fall Restrictions Weight Bearing Restrictions: No General: PT Amount of Missed Time (min): 35 Minutes PT Missed Treatment Reason: Other (Comment) (requesting to eat breakfast) Agitated Behavior Scale: TBI  03/16/23 0800  Observation Details  Observation Environment CIR  Start of observation period - Date 03/16/23  Start of observation period - Time 0803  End of observation period - Date 03/16/23  End of observation period - Time 0840  Agitated Behavior Scale (DO NOT LEAVE BLANKS)  Short attention span, easy distractibility, inability to concentrate 2  Impulsive, impatient, low tolerance for pain or frustration 1  Uncooperative, resistant to care, demanding 1  Violent and/or threatening violence toward people or property 1  Explosive and/or unpredictable anger 1  Rocking, rubbing, moaning, or other self-stimulating behavior 1  Pulling at tubes, restraints, etc. 1  Wandering from treatment areas 1  Restlessness, pacing, excessive movement 1  Repetitive behaviors, motor, and/or verbal 1  Rapid, loud, or excessive talking 1  Sudden changes of mood 1  Easily initiated or excessive crying and/or laughter 1  Self-abusiveness, physical and/or verbal 1  Agitated behavior scale total score 15     Therapy/Group: Individual Therapy  Edwin Cap PT, DPT 03/16/2023, 12:43 PM

## 2023-03-17 DIAGNOSIS — R4182 Altered mental status, unspecified: Secondary | ICD-10-CM | POA: Diagnosis not present

## 2023-03-17 DIAGNOSIS — N250 Renal osteodystrophy: Secondary | ICD-10-CM | POA: Diagnosis not present

## 2023-03-17 DIAGNOSIS — N186 End stage renal disease: Secondary | ICD-10-CM | POA: Diagnosis not present

## 2023-03-17 DIAGNOSIS — R5381 Other malaise: Secondary | ICD-10-CM | POA: Diagnosis not present

## 2023-03-17 DIAGNOSIS — Z992 Dependence on renal dialysis: Secondary | ICD-10-CM | POA: Diagnosis not present

## 2023-03-17 DIAGNOSIS — I120 Hypertensive chronic kidney disease with stage 5 chronic kidney disease or end stage renal disease: Secondary | ICD-10-CM | POA: Diagnosis not present

## 2023-03-17 DIAGNOSIS — D631 Anemia in chronic kidney disease: Secondary | ICD-10-CM | POA: Diagnosis not present

## 2023-03-17 LAB — RENAL FUNCTION PANEL
Albumin: 1.7 g/dL — ABNORMAL LOW (ref 3.5–5.0)
Anion gap: 20 — ABNORMAL HIGH (ref 5–15)
BUN: 49 mg/dL — ABNORMAL HIGH (ref 8–23)
CO2: 24 mmol/L (ref 22–32)
Calcium: 8.8 mg/dL — ABNORMAL LOW (ref 8.9–10.3)
Chloride: 90 mmol/L — ABNORMAL LOW (ref 98–111)
Creatinine, Ser: 10.46 mg/dL — ABNORMAL HIGH (ref 0.61–1.24)
GFR, Estimated: 5 mL/min — ABNORMAL LOW (ref >60)
Glucose, Bld: 115 mg/dL — ABNORMAL HIGH (ref 70–99)
Phosphorus: 5.9 mg/dL — ABNORMAL HIGH (ref 2.5–4.6)
Potassium: 4.5 mmol/L (ref 3.5–5.1)
Sodium: 134 mmol/L — ABNORMAL LOW (ref 135–145)

## 2023-03-17 LAB — CBC
HCT: 25.1 % — ABNORMAL LOW (ref 39.0–52.0)
Hemoglobin: 7.4 g/dL — ABNORMAL LOW (ref 13.0–17.0)
MCH: 28.2 pg (ref 26.0–34.0)
MCHC: 29.5 g/dL — ABNORMAL LOW (ref 30.0–36.0)
MCV: 95.8 fL (ref 80.0–100.0)
Platelets: 146 10*3/uL — ABNORMAL LOW (ref 150–400)
RBC: 2.62 MIL/uL — ABNORMAL LOW (ref 4.22–5.81)
RDW: 18.6 % — ABNORMAL HIGH (ref 11.5–15.5)
WBC: 4.9 10*3/uL (ref 4.0–10.5)
nRBC: 0.8 % — ABNORMAL HIGH (ref 0.0–0.2)

## 2023-03-17 LAB — GLUCOSE, CAPILLARY
Glucose-Capillary: 101 mg/dL — ABNORMAL HIGH (ref 70–99)
Glucose-Capillary: 136 mg/dL — ABNORMAL HIGH (ref 70–99)

## 2023-03-17 MED ORDER — ZIPRASIDONE HCL 20 MG PO CAPS
20.0000 mg | ORAL_CAPSULE | Freq: Every day | ORAL | Status: DC
  Administered 2023-03-17 – 2023-03-18 (×2): 20 mg via ORAL
  Filled 2023-03-17 (×2): qty 1

## 2023-03-17 MED ORDER — LORAZEPAM 2 MG/ML PO CONC
0.5000 mg | Freq: Four times a day (QID) | ORAL | Status: DC | PRN
  Filled 2023-03-17: qty 0.25

## 2023-03-17 MED ORDER — ALBUMIN HUMAN 25 % IV SOLN
25.0000 g | Freq: Once | INTRAVENOUS | Status: AC
  Administered 2023-03-17: 25 g via INTRAVENOUS
  Filled 2023-03-17 (×2): qty 100

## 2023-03-17 MED ORDER — HEPARIN SODIUM (PORCINE) 1000 UNIT/ML IJ SOLN
INTRAMUSCULAR | Status: AC
  Administered 2023-03-17: 2000 [IU] via INTRAVENOUS_CENTRAL
  Filled 2023-03-17: qty 2

## 2023-03-17 MED ORDER — PENTAFLUOROPROP-TETRAFLUOROETH EX AERO: 1.0000 | INHALATION_SPRAY | CUTANEOUS | Status: DC | PRN

## 2023-03-17 MED ORDER — TRAZODONE HCL 50 MG PO TABS
50.0000 mg | ORAL_TABLET | Freq: Every evening | ORAL | Status: DC | PRN
  Administered 2023-03-17 – 2023-04-28 (×13): 50 mg via ORAL
  Filled 2023-03-17 (×16): qty 1

## 2023-03-17 MED ORDER — OXYCODONE HCL 5 MG PO TABS
2.5000 mg | ORAL_TABLET | Freq: Three times a day (TID) | ORAL | Status: DC | PRN
  Administered 2023-03-19 – 2023-05-04 (×20): 2.5 mg via ORAL
  Filled 2023-03-17 (×24): qty 1

## 2023-03-17 MED ORDER — AMITRIPTYLINE HCL 25 MG PO TABS
75.0000 mg | ORAL_TABLET | Freq: Every day | ORAL | Status: DC
  Administered 2023-03-17: 75 mg via ORAL
  Filled 2023-03-17: qty 1
  Filled 2023-03-17: qty 3

## 2023-03-17 MED ORDER — LORAZEPAM 0.5 MG PO TABS
0.5000 mg | ORAL_TABLET | Freq: Four times a day (QID) | ORAL | Status: DC | PRN
  Filled 2023-03-17: qty 1

## 2023-03-17 MED ORDER — HEPARIN SODIUM (PORCINE) 1000 UNIT/ML DIALYSIS
2000.0000 [IU] | INTRAMUSCULAR | Status: DC | PRN
  Filled 2023-03-17 (×2): qty 2

## 2023-03-17 MED ORDER — OXYCODONE HCL 5 MG PO TABS
5.0000 mg | ORAL_TABLET | Freq: Three times a day (TID) | ORAL | Status: DC
  Administered 2023-03-17: 5 mg via ORAL
  Filled 2023-03-17: qty 1

## 2023-03-17 MED ORDER — LORAZEPAM 2 MG/ML IJ SOLN: 0.5000 mg | Freq: Four times a day (QID) | INTRAMUSCULAR | Status: DC | PRN

## 2023-03-17 NOTE — Progress Notes (Signed)
Patient progressively became agitated, said staff members are trying to kidnap him and holding him hostage in enclosure bed. Patient was informed about safety but stated, "I have never tried to get up or anything," as he was trying to get out of enclosure bed by hopping in it with his bottom. Patient started screaming for his daughter. Nurse asked patient if he wanted to toilet or drink, but he refused and told nurse to leave him alone.   Patient was seen by MD and new orders were put in.

## 2023-03-17 NOTE — Progress Notes (Signed)
Physical Therapy Session Note  Patient Details  Name: [name] [name] MRN: [id number] Date of Birth: 09-Feb-1951  Today's Date: 03/17/2023 PT Individual Time: 4098-1191; 4782-9562 PT Individual Time Calculation (min): 27 min ; 20 min  Short Term Goals: Week 1:  PT Short Term Goal 1 (Week 1): Pt will complete bed mobility with min assist PT Short Term Goal 2 (Week 1): Pt will complete transfers with mod assist consistently PT Short Term Goal 3 (Week 1): Pt will ambulate with LRAD mod assist 6'  Skilled Therapeutic Interventions/Progress Updates:   Session 1 Pt received in enclosure bed which was prescribed last night due to increased agitation. Nursing present. Pt reports bil shoulder pain and LBP. Nursing gave him pain medication. Pt unable to give pain number. Pt agreeable to PT tx due to wanting to get out of the bed.   Theract: Getting in/out of enclosure bed min A; STS from enclosure bed min A. Wheelchair propulsion bil Ues x 18 ft back to room with PT S for safety. STS from w/c to enclosure bed CGA for safety. Stand to sit in enclosure bed CGA; Sit to supine into enclosure bed min A for LE placement. Pt upset about enclosure bed; PT explained it was being used for safety. Pt did not agree but PT reiterated it was for his safety.  Gait training: GT x 32 ft, x 18 ft, x 22 ft, x 28 ft, x 29 ft, x 18 ft, x 6 ft, x 13 ft with PT R HHA moderate assist with pt initially walking too fast and needing to be cued to walk slower and for directions for stride. Pt needed multiple seated rest breaks due to "my legs are tired."  Pt left in enclosure bed; 2LO2 on, call light within bed. No complaints of pain during tx or after.   Session 2 When PT arrived to pt's room, 2 nursing staff and 2 Mds were present trying to get him less agitated. Nurse told PT to not come in. PT arrived back to computer to see a message from charge nurse stating that during PT session, they would switch out his enclosure bed  to a regular bed due to dialysis. PT went back to room where 1 MD was present and 2 nurses, one of the nurse's being the RN who sent PT the message. PT stated she received message and how she could be of assistance. When asked by MD if pt had pain, pt yelled "NO." Pt agreed to PT assistance due to wanting to get out of enclosure bed as prior.  Theract: Supine to sit transfer moderate assist due to agitation; Sitting EOB donning of shirt performed with PT dependency due to agitation. Stand pivot bed to wheelchair performed with PT CGA; pt declined ambulation or any other activities. PT/Nurse spoke with him regarding importance of safety to decrease falls risk which pt states he has not been unsafe since being in hospital. Seat alarm donned and pt placed at nurse's station until dialysis by nurse tech. Pt needed max encouragement to perform all tasks due to agitation.  Pt left in care of nurses; no complaints of pain; agitation present; wheelchair seat alarm on.      Therapy Documentation Precautions:  Precautions Precautions: Fall Restrictions Weight Bearing Restrictions: No      Therapy/Group: Individual Therapy  Luna Fuse 03/17/2023, 7:23 AM

## 2023-03-17 NOTE — Progress Notes (Signed)
Occupational Therapy Session Note  Patient Details  Name: [name] [name] MRN: [id number] Date of Birth: May 23, 1951  Today's Date: 03/17/2023 OT Individual Time: 5784-6962 OT Individual Time Calculation (min): 73 min    Short Term Goals: Week 1:  OT Short Term Goal 1 (Week 1): Pt will will complete 1 step of UB dressing task OT Short Term Goal 2 (Week 1): Patient will stand with min A in preparation for BADL task OT Short Term Goal 3 (Week 1): Pt will wash two body parts with min cues  Skilled Therapeutic Interventions/Progress Updates:    Pt greeted asleep in enclosure bed with oxygen on. Pt difficult to wake. He would wake for a minute and fall back asleep. Breakfast tray brought in and OT able to get pt to maintain alertness to get OOB and eat breakfast. Pt had difficulty motor planning bed mobility from enclosure bed requiring max A to get to sitting EOB. Mod A for stand-pivot to wc with pt not listening to OT directions on safety. Pt set-up with breakfast tray. Pt needed min A to open some containers and locate feeding utensils. Meal consumed at overall supervision level. Pt in overall more pleasant mood today and much more redirectable, but also more lethargic. Pt brought out of room for change of scenery and to increase alertness. Attempted to engage pt in standing card activity, but pt declined to stand and kept falling asleep. Pt brought to BITS and with cues, was able to stay awake to participate in 2, 2 minute reaction time dot tests. Pt was able to initiate pushing dots in all 4 quadrants with min cues. Pt requested to return to bed due to fatigue. PT completed stand-pivot back to bed with mos A. Pt left semi-reclined in enclosure bed with bed alarm on, call bell in reach, and needs met.   Therapy Documentation Precautions:  Precautions Precautions: Fall Restrictions Weight Bearing Restrictions: No Pain:  Denies pain  Therapy/Group: Individual Therapy  [name] 03/17/2023, 8:08 AM

## 2023-03-17 NOTE — Progress Notes (Signed)
Speech Language Pathology Note  Patient Details  Name: [name] [name] MRN: [id number] Date of Birth: [birth year] Today's Date: 03/17/2023  Missed visit:  SLP attempted to see the pt at scheduled time for ST session. Upon SLP arrival, pt was asleep in an enclosure bed. Initially, he awoke to max verbal stimuli for less than 1 minute and he denied pain. However, he became somnolent again and SLP attempted for 10 minutes to wake him with no success. SLP spoke with the patient's nurse about his somnolence and to let her know he is not in pain - she stated that he was awake all night and highly agitated. Another attempt was not possible as the pt had additional therapy and dialysis scheduled.   Alphonsus Sias 03/17/2023, 12:48 PM

## 2023-03-17 NOTE — Progress Notes (Addendum)
Patient had multiple attempts to get out of bed almost falling on two attempts. Called on-call PA for orders. Enclosure bed ordered. Patient safely in enclosure bed with call bell in reach

## 2023-03-17 NOTE — Procedures (Signed)
HD Note:  Some information was entered later than the data was gathered due to patient care needs. The stated time with the data is accurate.  Received patient in bed to unit.   Alert and agitated.  Patient did agree to have treatment.  Patient was sleepy and would fall back to sleep between questions.  Informed consent signed and in chart.   Access used: Left upper arm fistula Access issues: high pitched bruitt.  Patient woke and wanted to go to the bathroom.  He did not that it was not possible to get out of bed.  Treatment was stopped as patient became more heated.  Report was called to the patient's nurse and patient was taken back to his room  TX duration: 2.75  Total UF removed: 300 ml  Hand-off given to patient's nurse.   Transported back to the room   [name] [name] L. Dareen Piano, RN [facility].

## 2023-03-17 NOTE — Progress Notes (Signed)
Patient refused his 10pm heparin stating he already took that and that I was trying to overdose him.

## 2023-03-17 NOTE — Progress Notes (Signed)
PROGRESS NOTE   Subjective/Complaints:  Ongoing overnight agitation, received Ativan 1 mg p.o. after multiple attempts to get out of bed, was placed in enclosure bed.  Per nursing, did not sleep but was calm the rest of the night.    This AM, patient agitated, yelling for his wife, stating he is at his friend's house.  Stating he lives down the street from [place], and can walk home, requesting to leave.  Is intermittently oriented to time and place, but agitation makes him difficult to redirect/distracted.  Refusing multiple medications overnight and this morning; per discussion with patient's wife yesterday, did attempt to remove a.m. dose of Geodon but due to worsening agitation and concern that patient would be unable to get required hemodialysis today, added medication back on.  Due to poor availability of antipsychotics/agitation medications on dialysis unit, added as needed Ativan 0.5 mg p.o. or IV.  After a.m. Geodon, patient lethargic but most more relaxed, sitting at nurses station awake and oriented, eating lunch.  Patient does endorse some pain in his groin which she says is from being transferred to roughly, but will not allow provider to examine it.  Denies any other pain.   ROS: Per HPI above, c/b encephalopathy  Objective:   MR BRAIN WO CONTRAST  Result Date: 03/16/2023 CLINICAL DATA:  72 year old male with recent sepsis, pain, encephalopathy. COVID-19. Altered mental status. EXAM: MRI HEAD WITHOUT CONTRAST TECHNIQUE: Multiplanar, multiecho pulse sequences of the brain and surrounding structures were obtained without intravenous contrast. COMPARISON:  Head CT 02/15/2023. FINDINGS: Brain: Stable cerebral volume. No convincing restricted diffusion to suggest acute infarction. No midline shift, mass effect, evidence of mass lesion, ventriculomegaly, extra-axial collection or acute intracranial hemorrhage. Cervicomedullary junction  and pituitary are within normal limits. Patchy and confluent bilateral cerebral white matter T2 and FLAIR hyperintensity, especially in the periventricular white matter. No cortical encephalomalacia. However, chronic microhemorrhage in the right thalamus, and also suggestion of chronic hemosiderin in the left superior frontal gyrus on SWI (series 7, image 77). Otherwise the deep gray nuclei, brainstem and cerebellum appear negative for age. Vascular: Major intracranial vascular flow voids are preserved. Skull and upper cervical spine: Chronic right lamina papyracea fracture. Otherwise negative. Visualized bone marrow signal is within normal limits. Sinuses/Orbits: Postoperative changes to both globes. Mild and chronic appearing left frontal sinus opacification. Other paranasal sinuses are stable and well aerated. Other: Trace mastoid fluid bilaterally. Negative visible nasopharynx. Grossly normal other visible internal auditory structures. Negative visible scalp and face. IMPRESSION: 1. No acute intracranial abnormality. 2. Moderate for age white matter signal changes with occasional chronic microhemorrhages, most commonly due to chronic small vessel disease. Electronically Signed   By: Odessa Fleming M.D.   On: 03/16/2023 11:09   Recent Labs    03/15/23 1330  WBC 4.6  HGB 7.5*  HCT 24.5*  PLT 161   Recent Labs    03/15/23 1330  NA 133*  K 4.5  CL 96*  CO2 24  GLUCOSE 136*  BUN 48*  CREATININE 10.85*  CALCIUM 8.4*    Intake/Output Summary (Last 24 hours) at 03/17/2023 1721 Last data filed at 03/17/2023 1423 Gross per 24 hour  Intake  812 ml  Output --  Net 812 ml         Physical Exam: Vital Signs Blood pressure 110/63, pulse 63, temperature 97.7 F (36.5 C), temperature source Axillary, resp. rate 15, height 5\' 4"  (1.626 m), weight 105 kg, SpO2 100%.     Assessment/Plan: 1. Functional deficits which require 3+ hours per day of interdisciplinary therapy in a comprehensive inpatient  rehab setting. Physiatrist is providing close team supervision and 24 hour management of active medical problems listed below. Physiatrist and rehab team continue to assess barriers to discharge/monitor patient progress toward functional and medical goals  Care Tool:  Bathing    Body parts bathed by patient: Right arm, Left arm, Chest, Abdomen, Front perineal area, Right upper leg, Buttocks, Left upper leg, Right lower leg, Left lower leg, Face         Bathing assist Assist Level: Total Assistance - Patient < 25% (due to pt refusing to assist)     Upper Body Dressing/Undressing Upper body dressing        Upper body assist Assist Level: Total Assistance - Patient < 25%    Lower Body Dressing/Undressing    PE: Constitution: Appropriate appearance for age. No apparent distress +Obese.  Laying in bed Resp: No respiratory distress. No accessory muscle usage. on RA and CTAB Cardio: Well perfused appearance. No peripheral edema.    + Left upper extremity fistula Abdomen: Mildly distended. TTP  LLQ, with fullness/edema around prior PD site - improved.  Hypoactive bowel sounds Psych: Agitated, difficult to redirect.  Neurologic Exam:   Awake, alert.  Oriented to self, place, and time, but very distractible and difficult to keep on task with orientation. + Cognitive delay -ongoing, affected by lethargy! + Memory deficit - ongoing + Poor insight + Mild L ptosis   Sensory exam: Intact to touch in all 4 extremities Motor exam: 5/5 in all 4 extremities Coordination: Slight bilateral upper extremity tremor, intention, mild.  Notable twitching in right neck at rest today.  Musculoskeletal: No joint swelling noted No abnormal tone    Lower body dressing      What is the patient wearing?: Pants     Lower body assist Assist for lower body dressing: Total Assistance - Patient < 25%     Toileting Toileting    Toileting assist Assist for toileting: 2 Helpers      Transfers Chair/bed transfer  Transfers assist     Chair/bed transfer assist level: Minimal Assistance - Patient > 75%     Locomotion Ambulation   Ambulation assist      Assist level: Minimal Assistance - Patient > 75% Assistive device: Hand held assist Max distance: 35'   Walk 10 feet activity   Assist     Assist level: Minimal Assistance - Patient > 75% Assistive device: Hand held assist   Walk 50 feet activity   Assist Walk 50 feet with 2 turns activity did not occur: Safety/medical concerns         Walk 150 feet activity   Assist Walk 150 feet activity did not occur: Safety/medical concerns         Walk 10 feet on uneven surface  activity   Assist Walk 10 feet on uneven surfaces activity did not occur: Safety/medical concerns         Wheelchair     Assist Is the patient using a wheelchair?: Yes Type of Wheelchair: Manual    Wheelchair assist level: Dependent - Patient 0%  Wheelchair 50 feet with 2 turns activity    Assist        Assist Level: Dependent - Patient 0%   Wheelchair 150 feet activity     Assist      Assist Level: Dependent - Patient 0%   Blood pressure 110/63, pulse 63, temperature 97.7 F (36.5 C), temperature source Axillary, resp. rate 15, height 5\' 4"  (1.626 m), weight 105 kg, SpO2 100%.  Medical Problem List and Plan: 1. Functional deficits secondary to metabolic encephalopathy due to sepsis secondary to catheter peritonitis and hypoxemia              -patient may not shower             -ELOS/Goals: 16-18 days, PT/OT Sup, SLP min A -tentative discharge date 9-9             -Continue CIR  -Will review records from [facility] -- has worsened since approximately 8/22, possible 8/19, consistent with records from nursing at [facility] with current behavior.    - 8/28: Veil bed removed by hospitalist overnight, ongoing intermittent agitation overnight; on tele currently. WOULD NOT add back soft  wrist restraints d/t delirium risk, except with dialysis. If any further violence toward staff, would add back veil bed for safety -added back 8-31   2.  Antithrombotics: -DVT/anticoagulation:  Pharmaceutical: Heparin             -antiplatelet therapy: aspirin and Plavix   3. Pain Management: Tylenol as needed             -Lidoderm patches to back  -Elavil 25 mg at bedtime for nerve pain and sleep 8/27  -8/29: Pain well controlled on Elavil; DC lyrica, PRN Tylenol/oxy 2.5-5 mg d/t CKD  8-30: Appears patient has been getting pain medications together overnight along with antipsychotics, will discuss with nursing  8-31: Patient having an difficulty endorsing needs and not utilizing PRNs, scheduled oxycodone 5 mg 3 times daily and has 2.5 mg prn 3 times daily available.    4. Mood/Behavior/Sleep: LCSW to evaluate and provide emotional support             -continue Seroquel 50 mg TID             -clonazepam 0.5 mg TID prn             -Geodon 10 mg IM q 12 hours prn             -antipsychotic agents: n/a  -8-27: Agitated by soft restraints in bed, transition to 4 side rails, Posey alarm, and TeleSitter for now.  Veil bed on standby if needed, however feel we should avoid this if possible due to likelihood of agitating patient further   - 8/28: Placed in veil bed; wrist restraints for dialysis. Scheduled Geodon 20 mg twice daily; discontinue Seroquel and clonazepam due to no perceivable benefit. Add as needed Zyprexa 5 mg twice daily p.o. or IM due to recent cardiac arrhythmias and concern for QTc prolongation. As needed ramelteon for sleep. Stop gabapentin due to possible side effect of agitation  - 8/28: No improvement except temporarily with Geodon; add depakote 125 mg q8h for ongoing aggitation, mood stabilization. Increase elavil to 50 mg at bedtime.  Workup ongoing.    - 8/29: EEG negative, labs normal. MRI pending. Sleeping well and calm since starting depakote and increase in elavil. Will  monitor tonight and wean Geodon in AM if appropriate.   8-30: MRI showing chronic microvascular  changes, otherwise normal.  Increasingly calm and appropriate during the day, with ongoing agitation at nighttime.  Will increase nighttime Geodon to 40 mg; will discuss sleep log with nursing, as it has not been performed.  8-31 no sleep overnight per sleep log, again getting agitated around 9 PM and 5 AM.  Not responsive to as needed Zyprexa, responsive to Ativan.  Attempted to wean off scheduled a.m. Geodon today, but patient became extremely agitated and unable to redirect, calm down with readministration of medication.  Resume Geodon 20 mg every morning/40 mg every afternoon, increased Elavil to 75 mg nightly, adding trazodone 50 mg nightly as needed.  P.o./IV Ativan 0.5 mg every 6 hours for agitation.   5. Neuropsych/cognition: This patient is not capable of making decisions on his own behalf   -recommend neuropsych consult   - 8/27: Ongoing aggitation; Consult hospitalist for further workup on potential contributors to encephalopathy. Pending UA, LA 2.1-> 1.4; otherwise negative so far -Delirium precautions, sleep log, Veiled bed for safety and to minimize restraints -Wife has expressed that she is not interested in palliative care at this time; hopefully with above measures patient can get dialysis in the next 48 hours to limit worsening uremic encephalopathy and medical decline    - 8/29: Aggitation much improved, able to get some dialysis last night and today. See above for medication changes. Calling wife tonight to discuss.   8-30: Still with moderate cognitive deficits, however appears once patient is less agitated he is better able to concentrate and has better orientation.  MRI negative for acute findings, monitor.  8-31: Waxing and waning, has difficulty with concentration and memory primarily.  Attempted to wean off of antipsychotics this morning, failed as above.  Will discuss with neurology  in the a.m. if any additional ideas regarding workup of encephalopathy.   6. Skin/Wound Care: Routine skin care checks             -sacral skin off-loading/protection   7. Fluids/Electrolytes/Nutrition: Strict Is and Os and follow-up chemistries per nephrology             -renal diet/ 1200 cc FR  - Renvela 800 mg 3 times daily for hyperphosphatemia added per nephrology 8-27   8: Hypotension: monitor TID and prn             -continue midodrine as needed during dialysis  Normotensive, monitor    03/17/2023    4:56 PM 03/17/2023    4:41 PM 03/17/2023    4:30 PM  Vitals with BMI  Systolic 110 107 96  Diastolic 63 69 62  Pulse 63 65 64      9: Hyperlipidemia: continue statin   10: CAD s/p CABG 2023: on DAPT and statin.  - troponin elevated but stable, monitor 8/27   11: DM-2: A1c = 6.3% (at home on Lantus 22 units at bedtime, Tradjenta 5 mg daily)        -monitor PO intake             -continue SSI (0-6 units) does not appear to be requiring coverage   - well controlled   Recent Labs    03/16/23 1649 03/16/23 2032 03/17/23 1158  GLUCAP 114* 139* 101*     12: ESRD: HD on Tues, Thurs, Sat -nephrology made aware   - Unable to tolerate 8/27 d/t aggitation, stopped early 8/28;  - attempting again 8/29, stopped early again -Per nephrology, redirectable with gum   13: COPD/OSA; chronic home 2L O2 (  Symbicort, Ventolin, prednisone 40 mg daily at home)             -continue symbicort  -Added home oxygen back 8-27   14: GERD: continue Protonix   15: Non-sustained VT/pAF: per cardiology, Dr. Algie Coffer: -continue carvedilol 6.25 mg BID; consider increasing to 12.5 mg BID before starting low dose amiodarone  -consider Eliquis and discontinue asa and Plavix in one week if no active GI bleeding and stable hemoglobin from 8/24 -Continue mag-ox to avoid hypomagnesemia -8-27: Single instance of bradycardia this morning 40s, may have been missed read, monitor through today and do not  further adjust beta-blocker medications  - 8/28-29: vitals stable   16: HFrEF: Daily weights. Monitor for signs of overload  - Weight able  Filed Weights   03/15/23 0500 03/15/23 1811 03/16/23 0500  Weight: 106.6 kg 104.6 kg 105 kg      17: Liver nodularity on CT scan: LFTs reported to be normal. Avoid hepatotoxic medications    -Abdominal ultrasound earlier this admission with minimal ascites; ammonia within normal limits  -8/31: Did discuss with nephrology whether p.o. lactulose would be beneficial despite normal ammonia, after reviewing records unsure that this would be beneficial and with diabetes in veil bed does come with some risk, will discuss in a.m. as above.   18: Epistaxis - resolved             -previously improved with flonase and saline spray   19: Morbid Obesity. Dietary counseling Body mass index is 39.01 kg/m.      LOS: 5 days A FACE TO FACE EVALUATION WAS PERFORMED  Angelina Sheriff 03/17/2023, 5:21 PM

## 2023-03-17 NOTE — Progress Notes (Signed)
Patient yelling at nursing stating that he is leaving and we can't stop him. Patient multiple times attempted to get out of bed or the wheelchair and yelling at nursing. Nurse administered Zyprexa for agitation with no relief of symptoms.

## 2023-03-17 NOTE — Progress Notes (Addendum)
[name] Progress Note   Subjective:   Patient seen and examined at bedside.  Has been in enclosure bed most of the morning but now back in regular bed. Sleepy and wakes after repeat verbal stimuli.  Nods head no to questions of CP, SOB and n/v/d, then returns to sleep.    Objective Vitals:   03/16/23 1335 03/16/23 1654 03/16/23 2027 03/17/23 1201  BP: (!) 84/58 (!) 91/54 (!) 86/60 108/81  Pulse: 64 79 (!) 51 65  Resp: 16 16 16 17   Temp: (!) 97.3 F (36.3 C) 97.9 F (36.6 C) 97.9 F (36.6 C) (!) 97.4 F (36.3 C)  TempSrc:   Axillary Oral  SpO2: 96% 95% 95% 92%  Weight:      Height:       Physical Exam General:WDWN in NAD Heart:RRR Lungs:CTAB, nml WOB on RA Abdomen:soft, NTND Extremities:trace LE edema Dialysis Access: LU AVF +b/t   Filed Weights   03/15/23 0500 03/15/23 1811 03/16/23 0500  Weight: 106.6 kg 104.6 kg 105 kg    Intake/Output Summary (Last 24 hours) at 03/17/2023 1411 Last data filed at 03/17/2023 1323 Gross per 24 hour  Intake 692 ml  Output --  Net 692 ml    Additional Objective Labs: Basic Metabolic Panel: Recent Labs  Lab 03/13/23 0816 03/15/23 1330  NA 137 133*  K 4.8 4.5  CL 99 96*  CO2 25 24  GLUCOSE 145* 136*  BUN 48* 48*  CREATININE 11.38* 10.85*  CALCIUM 8.6* 8.4*  PHOS 6.4* 5.4*   Liver Function Tests: Recent Labs  Lab 03/13/23 0816 03/15/23 1330  ALBUMIN 1.6* 1.6*   CBC: Recent Labs  Lab 03/13/23 0816 03/15/23 1330  WBC 4.3 4.6  HGB 8.1* 7.5*  HCT 27.5* 24.5*  MCV 93.9 91.8  PLT 188 161   CBG: Recent Labs  Lab 03/15/23 2032 03/16/23 1214 03/16/23 1649 03/16/23 2032 03/17/23 1158  GLUCAP 102* 132* 114* 139* 101*   Iron Studies:  Recent Labs    03/15/23 1330  IRON 94  TIBC 248*  FERRITIN 2,175*   Lab Results  Component Value Date   INR 1.0 03/08/2023   INR 1.1 03/03/2023   INR 1.2 02/18/2023   Studies/Results: MR BRAIN WO CONTRAST  Result Date: 03/16/2023 CLINICAL DATA:   72 year old male with recent sepsis, pain, encephalopathy. COVID-19. Altered mental status. EXAM: MRI HEAD WITHOUT CONTRAST TECHNIQUE: Multiplanar, multiecho pulse sequences of the brain and surrounding structures were obtained without intravenous contrast. COMPARISON:  Head CT 02/15/2023. FINDINGS: Brain: Stable cerebral volume. No convincing restricted diffusion to suggest acute infarction. No midline shift, mass effect, evidence of mass lesion, ventriculomegaly, extra-axial collection or acute intracranial hemorrhage. Cervicomedullary junction and pituitary are within normal limits. Patchy and confluent bilateral cerebral white matter T2 and FLAIR hyperintensity, especially in the periventricular white matter. No cortical encephalomalacia. However, chronic microhemorrhage in the right thalamus, and also suggestion of chronic hemosiderin in the left superior frontal gyrus on SWI (series 7, image 77). Otherwise the deep gray nuclei, brainstem and cerebellum appear negative for age. Vascular: Major intracranial vascular flow voids are preserved. Skull and upper cervical spine: Chronic right lamina papyracea fracture. Otherwise negative. Visualized bone marrow signal is within normal limits. Sinuses/Orbits: Postoperative changes to both globes. Mild and chronic appearing left frontal sinus opacification. Other paranasal sinuses are stable and well aerated. Other: Trace mastoid fluid bilaterally. Negative visible nasopharynx. Grossly normal other visible internal auditory structures. Negative visible scalp and face. IMPRESSION: 1. No acute intracranial  abnormality. 2. Moderate for age white matter signal changes with occasional chronic microhemorrhages, most commonly due to chronic small vessel disease. Electronically Signed   By: Odessa Fleming M.D.   On: 03/16/2023 11:09    Medications:   (feeding supplement) PROSource Plus  30 mL Oral BID BM   acidophilus  1 capsule Oral Daily   amitriptyline  50 mg Oral QHS    aspirin EC  81 mg Oral Daily   atorvastatin  80 mg Oral Daily   carvedilol  6.25 mg Oral BID WC   Chlorhexidine Gluconate Cloth  6 each Topical Q0600   clopidogrel  75 mg Oral Daily   darbepoetin (ARANESP) injection - DIALYSIS  150 mcg Subcutaneous Q Fri-1800   divalproex  125 mg Oral Q8H   feeding supplement  237 mL Oral BID WC   Gerhardt's butt cream  1 Application Topical QID   heparin  5,000 Units Subcutaneous Q8H   insulin aspart  0-6 Units Subcutaneous TID WC   lidocaine  1 patch Transdermal Q2200   lidocaine  1 patch Transdermal Q24H   magnesium oxide  400 mg Oral Daily   mometasone-formoterol  2 puff Inhalation BID   multivitamin  1 tablet Oral QHS   oxyCODONE  5 mg Oral TID   pantoprazole  40 mg Oral BID   sevelamer carbonate  800 mg Oral TID WC   ziprasidone  20 mg Oral Q breakfast   ziprasidone  40 mg Oral Q supper    Dialysis Orders: Previously on [facility] thru [facility] - changed to HD - will need outpatient HD unit established prior to d/c.   Assessment/Plan: Debility: In CIR - follow. Encephalopathy/agitation: Agitation worsened overnight requiring enclosure bed. Unclear cause - present for past 1-2 weeks per wife (was happening at [facility] as well). Work-up ongoing, ammonia fine, brain MRI with no acute findings. Unable to dialyze 8/27 - was upset/yelling. Per PCT combative/yelling on 8/28 as well, taken off early.  No issues reported on 8/29.  Prn Ativan ordered while in HD. ESRD:  Transitioned from PD to HD recently -> usual TTS schedule. Using old AVF which is still functional, s/p f'gram 02/28/23. Unable to dialyze 8/27, shortened HD on 8/29. No issues reported on 8/30.  If unable to safely dialyze will need to consider hospice.  Hypertension/volume: BP stable.  Trace LE edema noted on exam.  No respiratory issues.  Plan to increase UF goal to improve edema.  Working to establish dry weight. Reinforced fluid restrictions.  Anemia: Hgb 8.1, tsat 38% with ferritin  2175.  No iron.  Increased aranesp to for dose yesterday. Metabolic bone disease: CorrCa and phos in goal.  On Renvela 1 AC TID.  Nutrition:  Alb very low, continue supps.  Renal diet w/fluid restrictions.   Hx NSVT: On Coreg  CAD  T2DM  Recent COVID July 2024 c/b HCAP s/p antibiotic course (Unasyn) on 8/21  Recent fungemia/candida peritonitis s/p antifungal course   [name] Norfolk, PA-C [facility] 03/17/2023,2:11 PM  LOS: 5 days    Seen and examined independently.  Agree with note and exam as documented above by physician extender and as noted here.  Agitated today and yelling while on the floor.  The team asks what medications that they can order for [place] to use in HD - per staff, ativan is an option and the team prefers this to haldol. The rehab team is titrating his anti-psychotics - he is about to get geodon.  General adult male in bed in no acute distress HEENT normocephalic atraumatic extraocular movements intact sclera anicteric Neck supple trachea midline Lungs clear to auscultation bilaterally normal work of breathing at rest on room air Cardiovascular S1S2 no rub Abdomen soft nontender nondistended Extremities no obvious edema  Psych agitation today.  He is in a cage bed apparatus currently Neuro awake and conversant; still altered from baseline.   Access LUE avf without bruit for me   # ESRD - Last HD went ok but agitation today on the floor - HD is per TTS schedule - Team has ordered ativan PRN for HD - we had previously reached out to [facility] to [facility] for outpatient HD chair (he is a former PD patient who transitioned to HD during a prolonged hospitalization)    # Debility  - Per CIR team  - note also marked hypoalbuminemia    # Agitation - last two HD sessions have gone ok but worse today on the floor - meds as above - He has responded to PRN's thus far the past couple of HD attempts.  Should issues recur and if this is his new baseline and  not able to make progress then goals of care discussions are appropriate.    # HTN  - acceptable control   [name] Emms, MD 03/17/2023 2:35 PM

## 2023-03-18 DIAGNOSIS — N250 Renal osteodystrophy: Secondary | ICD-10-CM | POA: Diagnosis not present

## 2023-03-18 DIAGNOSIS — I120 Hypertensive chronic kidney disease with stage 5 chronic kidney disease or end stage renal disease: Secondary | ICD-10-CM | POA: Diagnosis not present

## 2023-03-18 DIAGNOSIS — R4182 Altered mental status, unspecified: Secondary | ICD-10-CM | POA: Diagnosis not present

## 2023-03-18 DIAGNOSIS — R5381 Other malaise: Secondary | ICD-10-CM | POA: Diagnosis not present

## 2023-03-18 DIAGNOSIS — D631 Anemia in chronic kidney disease: Secondary | ICD-10-CM | POA: Diagnosis not present

## 2023-03-18 DIAGNOSIS — N186 End stage renal disease: Secondary | ICD-10-CM | POA: Diagnosis not present

## 2023-03-18 DIAGNOSIS — Z992 Dependence on renal dialysis: Secondary | ICD-10-CM | POA: Diagnosis not present

## 2023-03-18 LAB — CBC
HCT: 24.5 % — ABNORMAL LOW (ref 39.0–52.0)
Hemoglobin: 7.2 g/dL — ABNORMAL LOW (ref 13.0–17.0)
MCH: 28.6 pg (ref 26.0–34.0)
MCHC: 29.4 g/dL — ABNORMAL LOW (ref 30.0–36.0)
MCV: 97.2 fL (ref 80.0–100.0)
Platelets: 142 10*3/uL — ABNORMAL LOW (ref 150–400)
RBC: 2.52 MIL/uL — ABNORMAL LOW (ref 4.22–5.81)
RDW: 18.9 % — ABNORMAL HIGH (ref 11.5–15.5)
WBC: 5.4 10*3/uL (ref 4.0–10.5)
nRBC: 1.5 % — ABNORMAL HIGH (ref 0.0–0.2)

## 2023-03-18 LAB — GLUCOSE, CAPILLARY
Glucose-Capillary: 113 mg/dL — ABNORMAL HIGH (ref 70–99)
Glucose-Capillary: 134 mg/dL — ABNORMAL HIGH (ref 70–99)
Glucose-Capillary: 92 mg/dL (ref 70–99)
Glucose-Capillary: 119 mg/dL — ABNORMAL HIGH (ref 70–99)

## 2023-03-18 MED ORDER — LORAZEPAM 0.5 MG PO TABS
0.5000 mg | ORAL_TABLET | Freq: Two times a day (BID) | ORAL | Status: DC | PRN
  Administered 2023-03-19 – 2023-03-20 (×3): 0.5 mg via ORAL
  Filled 2023-03-18 (×3): qty 1

## 2023-03-18 MED ORDER — AMITRIPTYLINE HCL 25 MG PO TABS
50.0000 mg | ORAL_TABLET | Freq: Every day | ORAL | Status: AC
  Administered 2023-03-18 – 2023-03-19 (×2): 50 mg via ORAL
  Filled 2023-03-18 (×2): qty 2

## 2023-03-18 MED ORDER — MIDODRINE HCL 5 MG PO TABS
5.0000 mg | ORAL_TABLET | Freq: Three times a day (TID) | ORAL | Status: DC
  Administered 2023-03-18 – 2023-03-30 (×33): 5 mg via ORAL
  Filled 2023-03-18 (×36): qty 1

## 2023-03-18 MED ORDER — DARBEPOETIN ALFA 150 MCG/0.3ML IJ SOSY
150.0000 ug | PREFILLED_SYRINGE | INTRAMUSCULAR | Status: DC
  Administered 2023-03-18: 150 ug via SUBCUTANEOUS
  Filled 2023-03-18 (×2): qty 0.3

## 2023-03-18 MED ORDER — ZIPRASIDONE HCL 20 MG PO CAPS
20.0000 mg | ORAL_CAPSULE | Freq: Two times a day (BID) | ORAL | Status: DC
  Administered 2023-03-18 – 2023-03-27 (×16): 20 mg via ORAL
  Filled 2023-03-18 (×19): qty 1

## 2023-03-18 MED ORDER — DIVALPROEX SODIUM 125 MG PO CSDR: 125.0000 mg | DELAYED_RELEASE_CAPSULE | Freq: Two times a day (BID) | ORAL | Status: DC

## 2023-03-18 MED ORDER — AMITRIPTYLINE HCL 10 MG PO TABS
10.0000 mg | ORAL_TABLET | Freq: Every day | ORAL | Status: AC
  Administered 2023-03-22 – 2023-03-23 (×2): 10 mg via ORAL
  Filled 2023-03-18 (×2): qty 1

## 2023-03-18 MED ORDER — DIVALPROEX SODIUM 125 MG PO CSDR
125.0000 mg | DELAYED_RELEASE_CAPSULE | Freq: Three times a day (TID) | ORAL | Status: DC
  Administered 2023-03-18 – 2023-04-05 (×47): 125 mg via ORAL
  Filled 2023-03-18 (×54): qty 1

## 2023-03-18 MED ORDER — AMITRIPTYLINE HCL 25 MG PO TABS
25.0000 mg | ORAL_TABLET | Freq: Every day | ORAL | Status: AC
  Administered 2023-03-20 – 2023-03-21 (×2): 25 mg via ORAL
  Filled 2023-03-18 (×2): qty 1

## 2023-03-18 MED ORDER — LORAZEPAM 2 MG/ML PO CONC
0.5000 mg | Freq: Two times a day (BID) | ORAL | Status: DC | PRN
  Filled 2023-03-18: qty 0.25

## 2023-03-18 MED ORDER — MIDODRINE HCL 5 MG PO TABS: 5.0000 mg | ORAL_TABLET | Freq: Two times a day (BID) | ORAL | Status: DC

## 2023-03-18 MED ORDER — AMITRIPTYLINE HCL 25 MG PO TABS: 50.0000 mg | ORAL_TABLET | Freq: Every day | ORAL | Status: DC

## 2023-03-18 NOTE — Progress Notes (Signed)
Patient initially agitated ast shift change when coming back from dialysis. Patient up to nurses station to eat dinner. With encouragement patient transferred to bed . PRN trazodone given. Patient able to sleep most of the night. During a.m vitals patient restless but cooperative. When attempting to given scheduled heparin-patient more agitated and refused medication. Lights turned down and patient left with call bell within reach to deescalate. Enclosure bed secured.

## 2023-03-18 NOTE — Progress Notes (Addendum)
[facility] Progress Note   Subjective:   Patient seen and examined in room.  Inside of bed enclosure and requesting over and over that I unzip it so he can get out.  (Did not unzip for fear of eloping).  Denies CP, SOB, abdominal pain and n/v/d.   Objective Vitals:   03/17/23 2005 03/17/23 2031 03/18/23 0529 03/18/23 0853  BP:  95/69 (!) 90/56   Pulse: 78  70   Resp: 18 18 18    Temp:  98.2 F (36.8 C) 97.7 F (36.5 C)   TempSrc:  Oral Oral   SpO2: 95% 95% 99% 98%  Weight:      Height:       Physical Exam General:WDWN male in NAD Heart:RR per AM vitals Lungs:nml WOB on 2L O2, symmetric chest rise Abdomen:non distended Extremities:no obvious LE edema Dialysis Access: LU AVF   Filed Weights   03/15/23 0500 03/15/23 1811 03/16/23 0500  Weight: 106.6 kg 104.6 kg 105 kg    Intake/Output Summary (Last 24 hours) at 03/18/2023 1224 Last data filed at 03/18/2023 0833 Gross per 24 hour  Intake 356 ml  Output 2.75 ml  Net 353.25 ml    Additional Objective Labs: Basic Metabolic Panel: Recent Labs  Lab 03/13/23 0816 03/15/23 1330 03/17/23 1450  NA 137 133* 134*  K 4.8 4.5 4.5  CL 99 96* 90*  CO2 25 24 24   GLUCOSE 145* 136* 115*  BUN 48* 48* 49*  CREATININE 11.38* 10.85* 10.46*  CALCIUM 8.6* 8.4* 8.8*  PHOS 6.4* 5.4* 5.9*   Liver Function Tests: Recent Labs  Lab 03/13/23 0816 03/15/23 1330 03/17/23 1450  ALBUMIN 1.6* 1.6* 1.7*   CBC: Recent Labs  Lab 03/13/23 0816 03/15/23 1330 03/17/23 1450 03/18/23 0711  WBC 4.3 4.6 4.9 5.4  HGB 8.1* 7.5* 7.4* 7.2*  HCT 27.5* 24.5* 25.1* 24.5*  MCV 93.9 91.8 95.8 97.2  PLT 188 161 146* 142*   Blood Culture    Component Value Date/Time   SDES BLOOD RIGHT HAND 02/19/2023 1855   SPECREQUEST  02/19/2023 1855    BOTTLES DRAWN AEROBIC ONLY Blood Culture results may not be optimal due to an inadequate volume of blood received in culture bottles   CULT  02/19/2023 1855    NO GROWTH 5 DAYS Performed at  [facility], [address] [place], [place] [address]    REPTSTATUS 02/24/2023 FINAL 02/19/2023 1855    CBG: Recent Labs  Lab 03/16/23 2032 03/17/23 1158 03/17/23 2013 03/18/23 0623 03/18/23 1139  GLUCAP 139* 101* 136* 113* 134*   Iron Studies:  Recent Labs    03/15/23 1330  IRON 94  TIBC 248*  FERRITIN 2,175*   Lab Results  Component Value Date   INR 1.0 03/08/2023   INR 1.1 03/03/2023   INR 1.2 02/18/2023   Studies/Results: No results found.  Medications:   (feeding supplement) PROSource Plus  30 mL Oral BID BM   acidophilus  1 capsule Oral Daily   amitriptyline  75 mg Oral QHS   aspirin EC  81 mg Oral Daily   atorvastatin  80 mg Oral Daily   Chlorhexidine Gluconate Cloth  6 each Topical Q0600   clopidogrel  75 mg Oral Daily   darbepoetin (ARANESP) injection - DIALYSIS  150 mcg Subcutaneous Q Fri-1800   divalproex  125 mg Oral Q8H   feeding supplement  237 mL Oral BID WC   Gerhardt's butt cream  1 Application Topical QID   heparin  5,000 Units Subcutaneous Q8H   insulin aspart  0-6 Units Subcutaneous TID WC   lidocaine  1 patch Transdermal Q2200   lidocaine  1 patch Transdermal Q24H   magnesium oxide  400 mg Oral Daily   midodrine  5 mg Oral TID WC   mometasone-formoterol  2 puff Inhalation BID   multivitamin  1 tablet Oral QHS   pantoprazole  40 mg Oral BID   sevelamer carbonate  800 mg Oral TID WC   ziprasidone  20 mg Oral Q breakfast   ziprasidone  40 mg Oral Q supper    Dialysis Orders: Previously on CCPD thru [facility] - changed to HD - will need outpatient HD unit established prior to d/c.   Assessment/Plan: Debility: In CIR - follow. Encephalopathy/agitation: Agitation worsened overnight requiring enclosure bed again. Unclear cause - present for past 1-2 weeks per wife (was happening at [facility] as well). Work-up ongoing, ammonia fine, brain MRI with no acute findings. Unable to dialyze 8/27 - was upset/yelling. Per PCT  combative/yelling on 8/28 as well, taken off early.  No issues reported on 8/29.  Prn Ativan ordered while in HD. ESRD:  Transitioned from PD to HD recently -> usual TTS schedule. Using old AVF which is still functional, s/p f'gram 02/28/23. Unable to dialyze 8/27, shortened HD on 8/29. No issues reported on 8/30 or 8/31.  If unable to safely dialyze will need to consider hospice.  Hypertension/volume: BP stable.  Trace LE edema noted on exam.  No respiratory issues.  Working to establish dry weight. Reinforced fluid restrictions.  Anemia: Hgb 7.2, tsat 38% with ferritin 2175.  No iron.  Increased aranesp to for dose 8/30 but not given, will reorder for today. Metabolic bone disease: CorrCa and phos in goal.  On Renvela 1 AC TID.  Nutrition:  Alb very low, continue supps.  Renal diet w/fluid restrictions.   Hx NSVT: On Coreg  CAD  T2DM  Recent COVID July 2024 c/b HCAP s/p antibiotic course (Unasyn) on 8/21  Recent fungemia/candida peritonitis s/p antifungal course   Virgina Norfolk, PA-C [facility] 03/18/2023,12:24 PM  LOS: 6 days    Seen and examined independently.  Agree with note and exam as documented above by physician extender and as noted here.  General adult male in bed in no acute distress.  He has just stooled in the bed and I notified staff. (Appreciate their assistance) HEENT normocephalic atraumatic extraocular movements intact sclera anicteric Neck supple trachea midline Lungs clear to auscultation bilaterally normal work of breathing at rest on room air Cardiovascular S1S2 no rub Abdomen soft nontender nondistended Extremities no obvious edema  Psych no agitation on my exam.  He is in a cage bed apparatus currently Neuro awake and conversant; oriented to person, year, and location; still altered from baseline.   Access LUE avf is in place; did not auscultate today for fear of elopement   # ESRD - Last HD went ok but agitation on the floor in the  interim - HD is per TTS schedule - Team has ordered ativan PRN for HD.  Appreciate their assistance.  This has been changed to IV - we had previously reached out to SW to [facility] for outpatient HD chair (he is a former PD patient who transitioned to HD during a prolonged hospitalization).  Will need to follow-up with his mental status and follow-up with social work about a Banker  - Per CIR team  -  note also marked hypoalbuminemia    # Agitation - last couple of HD sessions have gone ok but worse today on the floor - meds as above - He has responded to PRN's thus far the past couple of HD attempts.  Should issues recur and if this is his new baseline and not able to make progress then goals of care discussions are appropriate.    # Hypotension - Team has started midodrine and I agree - increase to 5 mg TID for now and reassess.  Making sure not hypoperfusing  # Anemia of CKD  - would plan to give a unit of PRBC's with next HD to optimize  Estanislado Emms, MD 03/18/2023 4:20 PM

## 2023-03-18 NOTE — Progress Notes (Signed)
PROGRESS NOTE   Subjective/Complaints:  Again, HD had to be stopped early yesterday because of agitation, had 300 mL of ultrafiltrate removed.  Does not appear he got PRNs.  Received 2 unit, got as needed trazodone for sleep, and slept well through the night per nursing.  Did refuse some heparin injections this a.m., but otherwise more pleasant and redirectable.  Notable patient has been generally hypotensive, 90s over 50s.  Has been vitally stable, but with findings on MRI consistent with microvascular disease, query if he is hypoperfused and contributing to encephalopathy.  Discussed with nephrology given premedication with midodrine for dialysis, they agree with scheduled 3 times daily.  On exam, patient sitting at nurses station, lethargic.  Does arouse to repeated verbal stimuli, is irritable but can answer orientation questions with enough stimulation.  Per nursing, transferred from bed to wheelchair on his own, has been acting normally this a.m without any focal deficits.  Had a much better night overnight, only intermittent sleep log but at least 6 hours overall.  No PRNs used.  ROS: Per HPI above, c/b encephalopathy  Objective:   MR BRAIN WO CONTRAST  Result Date: 03/16/2023 CLINICAL DATA:  72 year old male with recent sepsis, pain, encephalopathy. COVID-19. Altered mental status. EXAM: MRI HEAD WITHOUT CONTRAST TECHNIQUE: Multiplanar, multiecho pulse sequences of the brain and surrounding structures were obtained without intravenous contrast. COMPARISON:  Head CT 02/15/2023. FINDINGS: Brain: Stable cerebral volume. No convincing restricted diffusion to suggest acute infarction. No midline shift, mass effect, evidence of mass lesion, ventriculomegaly, extra-axial collection or acute intracranial hemorrhage. Cervicomedullary junction and pituitary are within normal limits. Patchy and confluent bilateral cerebral white matter T2 and  FLAIR hyperintensity, especially in the periventricular white matter. No cortical encephalomalacia. However, chronic microhemorrhage in the right thalamus, and also suggestion of chronic hemosiderin in the left superior frontal gyrus on SWI (series 7, image 77). Otherwise the deep gray nuclei, brainstem and cerebellum appear negative for age. Vascular: Major intracranial vascular flow voids are preserved. Skull and upper cervical spine: Chronic right lamina papyracea fracture. Otherwise negative. Visualized bone marrow signal is within normal limits. Sinuses/Orbits: Postoperative changes to both globes. Mild and chronic appearing left frontal sinus opacification. Other paranasal sinuses are stable and well aerated. Other: Trace mastoid fluid bilaterally. Negative visible nasopharynx. Grossly normal other visible internal auditory structures. Negative visible scalp and face. IMPRESSION: 1. No acute intracranial abnormality. 2. Moderate for age white matter signal changes with occasional chronic microhemorrhages, most commonly due to chronic small vessel disease. Electronically Signed   By: Odessa Fleming M.D.   On: 03/16/2023 11:09   Recent Labs    03/17/23 1450 03/18/23 0711  WBC 4.9 5.4  HGB 7.4* 7.2*  HCT 25.1* 24.5*  PLT 146* 142*   Recent Labs    03/15/23 1330 03/17/23 1450  NA 133* 134*  K 4.5 4.5  CL 96* 90*  CO2 24 24  GLUCOSE 136* 115*  BUN 48* 49*  CREATININE 10.85* 10.46*  CALCIUM 8.4* 8.8*    Intake/Output Summary (Last 24 hours) at 03/18/2023 0925 Last data filed at 03/18/2023 0833 Gross per 24 hour  Intake 356 ml  Output 2.75 ml  Net  353.25 ml         Physical Exam: Vital Signs Blood pressure (!) 90/56, pulse 70, temperature 97.7 F (36.5 C), temperature source Oral, resp. rate 18, height 5\' 4"  (1.626 m), weight 105 kg, SpO2 98%.     Assessment/Plan: 1. Functional deficits which require 3+ hours per day of interdisciplinary therapy in a comprehensive inpatient rehab  setting. Physiatrist is providing close team supervision and 24 hour management of active medical problems listed below. Physiatrist and rehab team continue to assess barriers to discharge/monitor patient progress toward functional and medical goals  Care Tool:  Bathing    Body parts bathed by patient: Right arm, Left arm, Chest, Abdomen, Front perineal area, Right upper leg, Buttocks, Left upper leg, Right lower leg, Left lower leg, Face         Bathing assist Assist Level: Total Assistance - Patient < 25% (due to pt refusing to assist)     Upper Body Dressing/Undressing Upper body dressing        Upper body assist Assist Level: Total Assistance - Patient < 25%    Lower Body Dressing/Undressing    PE: Constitution: Appropriate appearance for age. No apparent distress +Obese.  Sitting up at nurses station, lethargic, wrapped in many blankets. Resp: No respiratory distress. No accessory muscle usage. on RA and CTAB Cardio: Well perfused appearance. No peripheral edema.    + Left upper extremity fistula Abdomen: Mildly distended. TTP  LLQ, with fullness/edema around prior PD site - improved.  Hypoactive bowel sounds Psych: Lethargic, agitated with stimulation.  Denying SI, HI.  Neurologic Exam:   Awake, alert.  Oriented to self, place, and time, but very distractible and difficult to keep on task with orientation -ongoing + Cognitive delay -ongoing, affected by lethargy + Memory deficit - ongoing + Poor insight + Mild L ptosis   Sensory exam: Intact to touch in all 4 extremities Motor exam: Antigravity and against resistance in all 4 extremities Coordination: Slight bilateral upper extremity tremor, intention, mild.    Musculoskeletal: No joint swelling noted No abnormal tone    Lower body dressing      What is the patient wearing?: Pants     Lower body assist Assist for lower body dressing: Total Assistance - Patient < 25%     Toileting Toileting     Toileting assist Assist for toileting: 2 Helpers     Transfers Chair/bed transfer  Transfers assist     Chair/bed transfer assist level: Minimal Assistance - Patient > 75%     Locomotion Ambulation   Ambulation assist      Assist level: Minimal Assistance - Patient > 75% Assistive device: Hand held assist Max distance: 35'   Walk 10 feet activity   Assist     Assist level: Minimal Assistance - Patient > 75% Assistive device: Hand held assist   Walk 50 feet activity   Assist Walk 50 feet with 2 turns activity did not occur: Safety/medical concerns         Walk 150 feet activity   Assist Walk 150 feet activity did not occur: Safety/medical concerns         Walk 10 feet on uneven surface  activity   Assist Walk 10 feet on uneven surfaces activity did not occur: Safety/medical concerns         Wheelchair     Assist Is the patient using a wheelchair?: Yes Type of Wheelchair: Manual    Wheelchair assist level: Dependent - Patient 0%  Wheelchair 50 feet with 2 turns activity    Assist        Assist Level: Dependent - Patient 0%   Wheelchair 150 feet activity     Assist      Assist Level: Dependent - Patient 0%   Blood pressure (!) 90/56, pulse 70, temperature 97.7 F (36.5 C), temperature source Oral, resp. rate 18, height 5\' 4"  (1.626 m), weight 105 kg, SpO2 98%.  Medical Problem List and Plan: 1. Functional deficits secondary to metabolic encephalopathy due to sepsis secondary to catheter peritonitis and hypoxemia              -patient may not shower             -ELOS/Goals: 16-18 days, PT/OT Sup, SLP min A -tentative discharge date 9-9             -Continue CIR  -Will review records from [facility] -- has worsened since approximately 8/22, possible 8/19, consistent with records from nursing at [facility] with current behavior.    - 8/28: Veil bed removed by hospitalist overnight, ongoing intermittent agitation  overnight; on tele currently. WOULD NOT add back soft wrist restraints d/t delirium risk, except with dialysis. If any further violence toward staff, would add back veil bed for safety -added back 8-31   2.  Antithrombotics: -DVT/anticoagulation:  Pharmaceutical: Heparin             -antiplatelet therapy: aspirin and Plavix   3. Pain Management: Tylenol as needed             -Lidoderm patches to back  -Elavil 25 mg at bedtime for nerve pain and sleep 8/27  -8/29: Pain well controlled on Elavil; DC lyrica, PRN Tylenol/oxy 2.5-5 mg d/t CKD  8-30: Appears patient has been getting pain medications together overnight along with antipsychotics, will discuss with nursing  8-31: Patient having an difficulty endorsing needs and not utilizing PRNs, scheduled oxycodone 5 mg 3 times daily and has 2.5 mg prn 3 times daily available.   9-1: Not utilizing PRNs, minimal complaints of pain, will discontinue scheduled given lethargy today..   4. Mood/Behavior/Sleep: LCSW to evaluate and provide emotional support             -continue Seroquel 50 mg TID             -clonazepam 0.5 mg TID prn             -Geodon 10 mg IM q 12 hours prn             -antipsychotic agents: n/a  -8-27: Agitated by soft restraints in bed, transition to 4 side rails, Posey alarm, and TeleSitter for now.  Veil bed on standby if needed, however feel we should avoid this if possible due to likelihood of agitating patient further   - 8/28: Placed in veil bed; wrist restraints for dialysis. Scheduled Geodon 20 mg twice daily; discontinue Seroquel and clonazepam due to no perceivable benefit. Add as needed Zyprexa 5 mg twice daily p.o. or IM due to recent cardiac arrhythmias and concern for QTc prolongation. As needed ramelteon for sleep. Stop gabapentin due to possible side effect of agitation  - 8/28: No improvement except temporarily with Geodon; add depakote 125 mg q8h for ongoing aggitation, mood stabilization. Increase elavil to 50 mg at  bedtime.  Workup ongoing.    - 8/29: EEG negative, labs normal. MRI pending. Sleeping well and calm since starting depakote and increase in elavil. Will  monitor tonight and wean Geodon in AM if appropriate.   8-30: MRI showing chronic microvascular changes, otherwise normal.  Increasingly calm and appropriate during the day, with ongoing agitation at nighttime.  Will increase nighttime Geodon to 40 mg; will discuss sleep log with nursing, as it has not been performed.  8-31 no sleep overnight per sleep log, again getting agitated around 9 PM and 5 AM.  Not responsive to as needed Zyprexa, responsive to Ativan.  Attempted to wean off scheduled a.m. Geodon today, but patient became extremely agitated and unable to redirect, calm down with readministration of medication.  Resume Geodon 20 mg every morning/40 mg every afternoon, increased Elavil to 75 mg nightly, adding trazodone 50 mg nightly as needed.  P.o./IV Ativan 0.5 mg every 6 hours for agitation.  9-1: slept well overnight with trazodone and increased Elavil, no PRNs used.  Unfortunately, oversedated today.  Discontinue scheduled pain medications as above, start wean of Elavil to 50 mg x2 days -> 25 mg x2 days -> 10 mg x2 days, off due to no apparent benefit and no complaints of pain.  Reduce Geodon from 20 mg every morning/40 mg every evening to 20 mg twice daily.  Continue trazodone as needed for sleep, as this seems to be the difference for a restful night yesterday.  If he remains overly sedated, would dose reduce and DC Depakote next.  Keep Ativan as needed d/t dialysis not having other options available.   5. Neuropsych/cognition: This patient is not capable of making decisions on his own behalf   -recommend neuropsych consult   - 8/27: Ongoing aggitation; Consult hospitalist for further workup on potential contributors to encephalopathy. Pending UA, LA 2.1-> 1.4; otherwise negative so far -Delirium precautions, sleep log, Veiled bed for safety  and to minimize restraints -Wife has expressed that she is not interested in palliative care at this time; hopefully with above measures patient can get dialysis in the next 48 hours to limit worsening uremic encephalopathy and medical decline    - 8/29: Aggitation much improved, able to get some dialysis last night and today. See above for medication changes. Calling wife tonight to discuss.   8-30: Still with moderate cognitive deficits, however appears once patient is less agitated he is better able to concentrate and has better orientation.  MRI negative for acute findings, monitor.  8-31: Waxing and waning, has difficulty with concentration and memory primarily.  Attempted to wean off of antipsychotics this morning, failed as above.  Will discuss with neurology in the a.m. if any additional ideas regarding workup of encephalopathy.   9-1: Query if hypoperfusion is contributing to symptoms of prolonged encephalopathy, see #8 below.  Appears lethargic and oversedated today, but without focal deficits, wean down medications as above.  6. Skin/Wound Care: Routine skin care checks             -sacral skin off-loading/protection   7. Fluids/Electrolytes/Nutrition: Strict Is and Os and follow-up chemistries per nephrology             -renal diet/ 1200 cc FR  - Renvela 800 mg 3 times daily for hyperphosphatemia added per nephrology 8-27   8: Hypotension: monitor TID and prn             -continue midodrine as needed during dialysis  Normotensive, monitor  9-1: Consistently 90s over 60s, has been stable but given findings of microvascular changes on MRI, may be too low for patient to perfuse properly.  Will add midodrine  5 mg tid and monitor.  DC Coreg as heart rate has been low to normal; may resume at low-dose once blood pressure improved.    03/18/2023    5:29 AM 03/17/2023    8:31 PM 03/17/2023    8:05 PM  Vitals with BMI  Systolic 90 95   Diastolic 56 69   Pulse 70  78      9:  Hyperlipidemia: continue statin   10: CAD s/p CABG 2023: on DAPT and statin.  - troponin elevated but stable, monitor 8/27   11: DM-2: A1c = 6.3% (at home on Lantus 22 units at bedtime, Tradjenta 5 mg daily)        -monitor PO intake             -continue SSI (0-6 units) does not appear to be requiring coverage   - well controlled   Recent Labs    03/17/23 1158 03/17/23 2013 03/18/23 0623  GLUCAP 101* 136* 113*     12: ESRD: HD on Tues, Thurs, Sat -nephrology made aware   - Unable to tolerate 8/27 d/t aggitation, stopped early 8/28;  - attempting again 8/29, stopped early again -Per nephrology, redirectable with gum -Have to switch restraints during dialysis sessions from veil bed to regular bed, then resume veil bed order once at rehab   13: COPD/OSA; chronic home 2L O2 (Symbicort, Ventolin, prednisone 40 mg daily at home)             -continue symbicort  -Added home oxygen back 8-27   14: GERD: continue Protonix   15: Non-sustained VT/pAF: per cardiology, Dr. Algie Coffer: -continue carvedilol 6.25 mg BID; consider increasing to 12.5 mg BID before starting low dose amiodarone  -consider Eliquis and discontinue asa and Plavix in one week if no active GI bleeding and stable hemoglobin from 8/24 -hemoglobin has remained stable but very low around 7, no recurrent A-fib, monitor for now given agitation/fall 8-31 and consider initiating Eliquis when safe  -Continue mag-ox to avoid hypomagnesemia -8-27: Single instance of bradycardia this morning 40s, may have been missed read, monitor through today and do not further adjust beta-blocker medications  -9-1: Heart rate stable, discontinue today for hypotension, if improved with midodrine with resume at low-dose   16: HFrEF: Daily weights. Monitor for signs of overload  - Weight able  Filed Weights   03/15/23 0500 03/15/23 1811 03/16/23 0500  Weight: 106.6 kg 104.6 kg 105 kg      17: Liver nodularity on CT scan: LFTs reported to be  normal. Avoid hepatotoxic medications    -Abdominal ultrasound earlier this admission with minimal ascites; ammonia within normal limits  -8/31: Did discuss with nephrology whether p.o. lactulose would be beneficial despite normal ammonia, after reviewing records unsure that this would be beneficial    18: Epistaxis - resolved             -previously improved with flonase and saline spray   19: Morbid Obesity. Dietary counseling Body mass index is 39.01 kg/m.      LOS: 6 days A FACE TO FACE EVALUATION WAS PERFORMED  Angelina Sheriff 03/18/2023, 9:25 AM

## 2023-03-19 ENCOUNTER — Encounter (HOSPITAL_COMMUNITY): Payer: Medicare Other

## 2023-03-19 DIAGNOSIS — I120 Hypertensive chronic kidney disease with stage 5 chronic kidney disease or end stage renal disease: Secondary | ICD-10-CM | POA: Diagnosis not present

## 2023-03-19 DIAGNOSIS — R451 Restlessness and agitation: Secondary | ICD-10-CM

## 2023-03-19 DIAGNOSIS — G479 Sleep disorder, unspecified: Secondary | ICD-10-CM

## 2023-03-19 LAB — GLUCOSE, CAPILLARY
Glucose-Capillary: 98 mg/dL (ref 70–99)
Glucose-Capillary: 111 mg/dL — ABNORMAL HIGH (ref 70–99)
Glucose-Capillary: 141 mg/dL — ABNORMAL HIGH (ref 70–99)
Glucose-Capillary: 108 mg/dL — ABNORMAL HIGH (ref 70–99)

## 2023-03-19 MED ORDER — DARBEPOETIN ALFA 150 MCG/0.3ML IJ SOSY
150.0000 ug | PREFILLED_SYRINGE | INTRAMUSCULAR | Status: DC
  Administered 2023-03-25 – 2023-05-06 (×6): 150 ug via SUBCUTANEOUS
  Filled 2023-03-19 (×7): qty 0.3

## 2023-03-19 MED ORDER — TRAZODONE HCL 50 MG PO TABS
50.0000 mg | ORAL_TABLET | Freq: Every day | ORAL | Status: DC
  Administered 2023-03-19 – 2023-04-27 (×39): 50 mg via ORAL
  Filled 2023-03-19 (×42): qty 1

## 2023-03-19 MED ORDER — CHLORHEXIDINE GLUCONATE CLOTH 2 % EX PADS: 6.0000 | MEDICATED_PAD | Freq: Every day | CUTANEOUS | Status: DC

## 2023-03-19 NOTE — Progress Notes (Signed)
[facility] Progress Note   Subjective:   Pt seen in room, he is in bed enclosure. Stirs but not fully waking up to verbal stimuli, appears he was given geodon shortly before I saw him.   Objective Vitals:   03/18/23 1426 03/18/23 2014 03/19/23 0402 03/19/23 0500  BP: 108/63 (!) 104/50 105/73   Pulse:  (!) 48 70   Resp: 18 15 18    Temp: 97.7 F (36.5 C) (!) 97.5 F (36.4 C) 97.7 F (36.5 C)   TempSrc: Oral     SpO2: 100% 100% 100%   Weight:    99.1 kg  Height:       Physical Exam General: Sleeping male, moving extremities spontaneously  Lungs: Respirations unlabored on room air Abdomen: Non-distended Extremities: No obvious edema Dialysis Access: LUE AVF  Additional Objective Labs: Basic Metabolic Panel: Recent Labs  Lab 03/13/23 0816 03/15/23 1330 03/17/23 1450  NA 137 133* 134*  K 4.8 4.5 4.5  CL 99 96* 90*  CO2 25 24 24   GLUCOSE 145* 136* 115*  BUN 48* 48* 49*  CREATININE 11.38* 10.85* 10.46*  CALCIUM 8.6* 8.4* 8.8*  PHOS 6.4* 5.4* 5.9*   Liver Function Tests: Recent Labs  Lab 03/13/23 0816 03/15/23 1330 03/17/23 1450  ALBUMIN 1.6* 1.6* 1.7*   No results for input(s): "LIPASE", "AMYLASE" in the last 168 hours. CBC: Recent Labs  Lab 03/13/23 0816 03/15/23 1330 03/17/23 1450 03/18/23 0711  WBC 4.3 4.6 4.9 5.4  HGB 8.1* 7.5* 7.4* 7.2*  HCT 27.5* 24.5* 25.1* 24.5*  MCV 93.9 91.8 95.8 97.2  PLT 188 161 146* 142*   Blood Culture    Component Value Date/Time   SDES BLOOD RIGHT HAND 02/19/2023 1855   SPECREQUEST  02/19/2023 1855    BOTTLES DRAWN AEROBIC ONLY Blood Culture results may not be optimal due to an inadequate volume of blood received in culture bottles   CULT  02/19/2023 1855    NO GROWTH 5 DAYS Performed at [facility], [address], [place], [place] [address]    REPTSTATUS 02/24/2023 FINAL 02/19/2023 1855    CBG: Recent Labs  Lab 03/18/23 0623 03/18/23 1139 03/18/23 1630 03/18/23 2015 03/19/23 0625   GLUCAP 113* 134* 92 119* 98    Medications:   (feeding supplement) PROSource Plus  30 mL Oral BID BM   acidophilus  1 capsule Oral Daily   amitriptyline  50 mg Oral QHS   Followed by   Melene Muller ON 03/20/2023] amitriptyline  25 mg Oral QHS   Followed by   Melene Muller ON 03/22/2023] amitriptyline  10 mg Oral QHS   aspirin EC  81 mg Oral Daily   atorvastatin  80 mg Oral Daily   Chlorhexidine Gluconate Cloth  6 each Topical Q0600   clopidogrel  75 mg Oral Daily   darbepoetin (ARANESP) injection - DIALYSIS  150 mcg Subcutaneous Q Fri-1800   divalproex  125 mg Oral Q8H   feeding supplement  237 mL Oral BID WC   Gerhardt's butt cream  1 Application Topical QID   heparin  5,000 Units Subcutaneous Q8H   insulin aspart  0-6 Units Subcutaneous TID WC   lidocaine  1 patch Transdermal Q2200   lidocaine  1 patch Transdermal Q24H   magnesium oxide  400 mg Oral Daily   midodrine  5 mg Oral TID WC   mometasone-formoterol  2 puff Inhalation BID   multivitamin  1 tablet Oral QHS   pantoprazole  40 mg Oral  BID   sevelamer carbonate  800 mg Oral TID WC   ziprasidone  20 mg Oral BID WC    Outpatient Dialysis Orders: Previously on [facility] thru [facility] - changed to HD - will need outpatient HD unit established prior to d/c.   Assessment/Plan: Debility: In CIR - follow. 2. Encephalopathy/agitation: Agitation worsened overnight requiring enclosure bed again. Unclear cause - present for past 1-2 weeks per wife (was happening at [facility] as well). Work-up ongoing, ammonia fine, brain MRI with no acute findings. Unable to dialyze 8/27 - was upset/yelling. We have frequently had to end HD early for his safety due to agitation.  Prn Ativan ordered while in HD. 3. ESRD:  Transitioned from PD to HD recently on TTS schedule. Using old AVF which is still functional, s/p f'gram 02/28/23. Unable to dialyze 8/27, intermittently having to shorten HD, see able. If unable to safely dialyze will need to consider hospice.   4. Hypertension/volume: BP stable. No respiratory issues.  Working to establish dry weight. Reinforced fluid restrictions.  5. Anemia: Hgb 7.2, tsat 38% with ferritin 2175.  No iron.  Increased aranesp to for dose 8/30 but not given, will reorder for today. 6. Metabolic bone disease: CorrCa and phos in goal.  On Renvela 1 AC TID. 7.  Nutrition:  Alb very low, continue supps.  Renal diet w/fluid restrictions.  8.  Hx NSVT: On Coreg 9.  Recent COVID July 2024 c/b HCAP s/p antibiotic course (Unasyn) on 8/21 10.  Recent fungemia/candida peritonitis s/p antifungal course  Rogers Blocker, PA-C 03/19/2023, 10:11 AM  [facility] Pager: [phone]

## 2023-03-19 NOTE — Progress Notes (Signed)
Occupational Therapy Note  Patient Details  Name: BURYL SCHNEIDERS MRN: [id number] Date of Birth: 06-Sep-1950  Today's Date: 03/19/2023 OT Missed Time: 75 Minutes Missed Time Reason: Patient fatigue  Patient asleep in bed and unable to wake for therapy. OT will try to see patient again as schedule allows.    [name] Kaizen Ibsen 03/19/2023, 11:34 AM

## 2023-03-19 NOTE — Progress Notes (Signed)
PROGRESS NOTE   Subjective/Complaints:  Pt remains somewhat lethargic d/t meds but they help to reduce his agitation. He wouldn't participate in therapy this morning (SLP). Did sleep 6 hours last night but was confused per RN  Pt thought I was someone else this morning on rounds. Wanted to know when he was "getting out of here"  Objective:   No results found. Recent Labs    03/17/23 1450 03/18/23 0711  WBC 4.9 5.4  HGB 7.4* 7.2*  HCT 25.1* 24.5*  PLT 146* 142*   Recent Labs    03/17/23 1450  NA 134*  K 4.5  CL 90*  CO2 24  GLUCOSE 115*  BUN 49*  CREATININE 10.46*  CALCIUM 8.8*    Intake/Output Summary (Last 24 hours) at 03/19/2023 1005 Last data filed at 03/19/2023 0855 Gross per 24 hour  Intake 866 ml  Output --  Net 866 ml         Physical Exam: Vital Signs Blood pressure 105/73, pulse 70, temperature 97.7 F (36.5 C), resp. rate 18, height 5\' 4"  (1.626 m), weight 99.1 kg, SpO2 100%.  Constitutional: No distress . Vital signs reviewed. In enclosure bed HEENT: NCAT, EOMI, oral membranes moist Neck: supple Cardiovascular: RRR without murmur. No JVD    Respiratory/Chest: CTA Bilaterally without wheezes or rales. Normal effort    GI/Abdomen: BS +, non-tender, sl distended, former PD site  Ext: no clubbing, cyanosis, or edema Psych: lethargic, distracted and restless at times. Non-agitated    Neurologic Exam:   Oriented to self, hospital. + lethargy + Memory deficit - ongoing + Poor insight + Mild L ptosis   Sensory exam: Intact to touch in all 4 extremities Motor exam: Antigravity and against resistance in all 4 extremities Coordination: Slight bilateral upper extremity tremor, intention, mild.    Musculoskeletal: No joint swelling noted No abnormal tone    Assessment/Plan: 1. Functional deficits which require 3+ hours per day of interdisciplinary therapy in a comprehensive inpatient rehab  setting. Physiatrist is providing close team supervision and 24 hour management of active medical problems listed below. Physiatrist and rehab team continue to assess barriers to discharge/monitor patient progress toward functional and medical goals  Care Tool:  Bathing    Body parts bathed by patient: Right arm, Left arm, Chest, Abdomen, Front perineal area, Right upper leg, Buttocks, Left upper leg, Right lower leg, Left lower leg, Face         Bathing assist Assist Level: Total Assistance - Patient < 25% (due to pt refusing to assist)     Upper Body Dressing/Undressing Upper body dressing        Upper body assist Assist Level: Total Assistance - Patient < 25%    Lower Body Dressing/Undressing      Lower body dressing      What is the patient wearing?: Pants     Lower body assist Assist for lower body dressing: Total Assistance - Patient < 25%     Toileting Toileting    Toileting assist Assist for toileting: 2 Helpers     Transfers Chair/bed transfer  Transfers assist     Chair/bed transfer assist level: Minimal Assistance - Patient >  75%     Locomotion Ambulation   Ambulation assist      Assist level: Minimal Assistance - Patient > 75% Assistive device: Hand held assist Max distance: 35'   Walk 10 feet activity   Assist     Assist level: Minimal Assistance - Patient > 75% Assistive device: Hand held assist   Walk 50 feet activity   Assist Walk 50 feet with 2 turns activity did not occur: Safety/medical concerns         Walk 150 feet activity   Assist Walk 150 feet activity did not occur: Safety/medical concerns         Walk 10 feet on uneven surface  activity   Assist Walk 10 feet on uneven surfaces activity did not occur: Safety/medical concerns         Wheelchair     Assist Is the patient using a wheelchair?: Yes Type of Wheelchair: Manual    Wheelchair assist level: Dependent - Patient 0%       Wheelchair 50 feet with 2 turns activity    Assist        Assist Level: Dependent - Patient 0%   Wheelchair 150 feet activity     Assist      Assist Level: Dependent - Patient 0%   Blood pressure 105/73, pulse 70, temperature 97.7 F (36.5 C), resp. rate 18, height 5\' 4"  (1.626 m), weight 99.1 kg, SpO2 100%.  Medical Problem List and Plan: 1. Functional deficits secondary to metabolic encephalopathy due to sepsis secondary to catheter peritonitis and hypoxemia              -patient may not shower             -ELOS/Goals: 16-18 days, PT/OT Sup, SLP min A -tentative discharge date 9-9             -Continue CIR  -Will review records from [facility] -- has worsened since approximately 8/22, possible 8/19, consistent with records from nursing at [facility] with current behavior.    -9/2 continue enclosure bed for safety   2.  Antithrombotics: -DVT/anticoagulation:  Pharmaceutical: Heparin             -antiplatelet therapy: aspirin and Plavix   3. Pain Management: Tylenol as needed             -Lidoderm patches to back  -Elavil 25 mg at bedtime for nerve pain and sleep 8/27  -8/29: Pain well controlled on Elavil; DC lyrica, PRN Tylenol/oxy 2.5-5 mg d/t CKD  8-30: Appears patient has been getting pain medications together overnight along with antipsychotics, will discuss with nursing  8-31: Patient having an difficulty endorsing needs and not utilizing PRNs, scheduled oxycodone 5 mg 3 times daily and has 2.5 mg prn 3 times daily available.   9-1: Not utilizing PRNs, minimal complaints of pain, will discontinue scheduled given lethargy today..   4. Mood/Behavior/Sleep: LCSW to evaluate and provide emotional support             -continue Seroquel 50 mg TID             -clonazepam 0.5 mg TID prn             -Geodon 10 mg IM q 12 hours prn             -antipsychotic agents: n/a  -8-27: Agitated by soft restraints in bed, transition to 4 side rails, Posey alarm, and  TeleSitter for now.  Veil bed on  standby if needed, however feel we should avoid this if possible due to likelihood of agitating patient further   - 8/28: Placed in veil bed; wrist restraints for dialysis. Scheduled Geodon 20 mg twice daily; discontinue Seroquel and clonazepam due to no perceivable benefit. Add as needed Zyprexa 5 mg twice daily p.o. or IM due to recent cardiac arrhythmias and concern for QTc prolongation. As needed ramelteon for sleep. Stop gabapentin due to possible side effect of agitation  - 8/28: No improvement except temporarily with Geodon; add depakote 125 mg q8h for ongoing aggitation, mood stabilization. Increase elavil to 50 mg at bedtime.  Workup ongoing.    - 8/29: EEG negative, labs normal. MRI pending. Sleeping well and calm since starting depakote and increase in elavil. Will monitor tonight and wean Geodon in AM if appropriate.   8-30: MRI showing chronic microvascular changes, otherwise normal.  Increasingly calm and appropriate during the day, with ongoing agitation at nighttime.  Will increase nighttime Geodon to 40 mg; will discuss sleep log with nursing, as it has not been performed.  8-31 no sleep overnight per sleep log, again getting agitated around 9 PM and 5 AM.  Not responsive to as needed Zyprexa, responsive to Ativan.  Attempted to wean off scheduled a.m. Geodon today, but patient became extremely agitated and unable to redirect, calm down with readministration of medication.  Resume Geodon 20 mg every morning/40 mg every afternoon, increased Elavil to 75 mg nightly, adding trazodone 50 mg nightly as needed.  P.o./IV Ativan 0.5 mg every 6 hours for agitation.  9-1: slept well overnight with trazodone and increased Elavil, no PRNs used.  Unfortunately, oversedated today.  Discontinue scheduled pain medications as above, start wean of Elavil to 50 mg x2 days -> 25 mg x2 days -> 10 mg x2 days, off due to no apparent benefit and no complaints of pain.  Reduce Geodon  from 20 mg every morning/40 mg every evening to 20 mg twice daily.  Continue trazodone as needed for sleep, as this seems to be the difference for a restful night yesterday.  If he remains overly sedated, would dose reduce and DC Depakote next.  Keep Ativan as needed d/t dialysis not having other options available.  9/2--non agitated but still sleepy   -dc depakote as I doubt it's doing much at this dose anyway   -continue geodon at 20mg  bid   -schedule trazodone at HS with back up dose if needed   5. Neuropsych/cognition: This patient is not capable of making decisions on his own behalf   -recommend neuropsych consult   - 8/27: Ongoing aggitation; Consult hospitalist for further workup on potential contributors to encephalopathy. Pending UA, LA 2.1-> 1.4; otherwise negative so far -Delirium precautions, sleep log, Veiled bed for safety and to minimize restraints -Wife has expressed that she is not interested in palliative care at this time; hopefully with above measures patient can get dialysis in the next 48 hours to limit worsening uremic encephalopathy and medical decline    - 8/29: Aggitation much improved, able to get some dialysis last night and today. See above for medication changes. Calling wife tonight to discuss.   8-30: Still with moderate cognitive deficits, however appears once patient is less agitated he is better able to concentrate and has better orientation.  MRI negative for acute findings, monitor.  8-31: Waxing and waning, has difficulty with concentration and memory primarily.  Attempted to wean off of antipsychotics this morning, failed as above.  Will  discuss with neurology in the a.m. if any additional ideas regarding workup of encephalopathy.   9-2: Query if hypoperfusion is contributing to symptoms of prolonged encephalopathy, see #8 below.  Wean meds as possible  6. Skin/Wound Care: Routine skin care checks             -sacral skin off-loading/protection   7.  Fluids/Electrolytes/Nutrition: Strict Is and Os and follow-up chemistries per nephrology             -renal diet/ 1200 cc FR  - Renvela 800 mg 3 times daily for hyperphosphatemia added per nephrology 8-27   8: Hypotension: monitor TID and prn             -continue midodrine as needed during dialysis  Normotensive, monitor  9-1: Consistently 90s over 60s, has been stable but given findings of microvascular changes on MRI, may be too low for patient to perfuse properly.  Will add midodrine 5 mg tid and monitor.  DC Coreg as heart rate has been low to normal; may resume at low-dose once blood pressure improved.    03/19/2023    5:00 AM 03/19/2023    4:02 AM 03/18/2023    8:14 PM  Vitals with BMI  Weight 218 lbs 8 oz    BMI 37.49    Systolic  105 104  Diastolic  73 50  Pulse  70 48    9/2 continue with ccurrent regimen. Bp's hr remain low   -elavil wean should help   9: Hyperlipidemia: continue statin   10: CAD s/p CABG 2023: on DAPT and statin.  - troponin elevated but stable, monitor 8/27   11: DM-2: A1c = 6.3% (at home on Lantus 22 units at bedtime, Tradjenta 5 mg daily)        -monitor PO intake             -continue SSI (0-6 units) does not appear to be requiring coverage   - well controlled   Recent Labs    03/18/23 1630 03/18/23 2015 03/19/23 0625  GLUCAP 92 119* 98     12: ESRD: HD on Tues, Thurs, Sat -nephrology made aware   - Unable to tolerate 8/27 d/t aggitation, stopped early 8/28;  - attempting again 8/29, stopped early again -Per nephrology, redirectable with gum -Have to switch restraints during dialysis sessions from veil bed to regular bed, then resume veil bed order once at rehab   13: COPD/OSA; chronic home 2L O2 (Symbicort, Ventolin, prednisone 40 mg daily at home)             -continue symbicort  -Added home oxygen back 8-27   14: GERD: continue Protonix   15: Non-sustained VT/pAF: per cardiology, Dr. Algie Coffer: -continue carvedilol 6.25 mg BID; consider  increasing to 12.5 mg BID before starting low dose amiodarone  -consider Eliquis and discontinue asa and Plavix in one week if no active GI bleeding and stable hemoglobin from 8/24 -hemoglobin has remained stable but very low around 7, no recurrent A-fib, monitor for now given agitation/fall 8-31 and consider initiating Eliquis when safe  -Continue mag-ox to avoid hypomagnesemia -8-27: Single instance of bradycardia this morning 40s, may have been missed read, monitor through today and do not further adjust beta-blocker medications  -9-2 hr remains brady still after holding coreg  16: HFrEF: Daily weights. Monitor for signs of overload  - Weight  stable/decreased  Filed Weights   03/15/23 1811 03/16/23 0500 03/19/23 0500  Weight: 104.6 kg 105 kg  99.1 kg      17: Liver nodularity on CT scan: LFTs reported to be normal. Avoid hepatotoxic medications    -Abdominal ultrasound earlier this admission with minimal ascites; ammonia within normal limits  -8/31: Did discuss with nephrology whether p.o. lactulose would be beneficial despite normal ammonia, after reviewing records unsure that this would be beneficial    18: Epistaxis - resolved             -previously improved with flonase and saline spray   19: Morbid Obesity. Dietary counseling Body mass index is 39.01 kg/m.      LOS: 7 days A FACE TO FACE EVALUATION WAS PERFORMED  Ranelle Oyster 03/19/2023, 10:05 AM

## 2023-03-19 NOTE — Progress Notes (Addendum)
SLP Cancellation Note  Patient Details Name: [name] [name] MRN: [id number] DOB: 06/02/1951  Cancelled treatment:        SLP attempted skilled therapeutic intervention for cognitive retraining and swallowing goals x2. Upon first attempt, patient refused intervention despite description of need for services. At 2nd attempt ~2.5 hours later, patient somnolent and not fully waking to max auditory and verbal stimuli. SLP will reattempt at next availability to carry out POC as indicated.                                                                                             [name], M.A., CCC-SLP  [name] 03/19/2023, 7:20 AM

## 2023-03-19 NOTE — Progress Notes (Signed)
Physical Therapy Session Note  Patient Details  Name: [name] [name] MRN: [id number] Date of Birth: [birth year]  Today's Date: 03/19/2023 PT Individual Time: 4098-1191 PT Individual Time Calculation (min): 70 min   Short Term Goals: Week 1:  PT Short Term Goal 1 (Week 1): Pt will complete bed mobility with min assist PT Short Term Goal 2 (Week 1): Pt will complete transfers with mod assist consistently PT Short Term Goal 3 (Week 1): Pt will ambulate with LRAD mod assist 24'  Skilled Therapeutic Interventions/Progress Updates:     Pt received seated in [facility] and agrees to therapy. No complaint of pain. Wc transport to gym for time management. Pt performs sit to stand with minA Rt HHA and cues for sequencing, initiation, and hand placement. Pt ambulates x100' with Rt HHA minA, with cues for posture and stability, as well as safe sequencing of transfer to WC. Following rest break, pt ambulates additional x100' with similar assistance but requires instance of modA due to anterior LOB. Following extended seated rest break, pt ambulates x175' with Rt HHA, and requires standing rest breaks beginning around 115', stopping to stand and "catch breath" x3-4 times prior to completing bout of ambulation. Pt transfers to Nustep with minA and cues for positioning. Pt completes Nustep for endurance training and reciprocal coordination. Pt completes x10:00 at workload of 5 with average steps per minute ~40. PT provides cues for hand and foot placement and completing full available ROM. Stand step to [facility] with minA and cues for sequencing and safety. Pt left seated in WC with alarm intact and all needs within reach.   Therapy Documentation Precautions:  Precautions Precautions: Fall Restrictions Weight Bearing Restrictions: No   Therapy/Group: Individual Therapy  Beau Fanny, PT, DPT 03/19/2023, 4:15 PM

## 2023-03-19 NOTE — Progress Notes (Signed)
Sleep chart completed. PRN trazodone given last night. Patient able to sleep about 6 hours. Patient did wake up around 0100-0200 confused calling out for individuals that were not there. Patient able to be redirected and distracted with other tasks with extra time. Patient continues to refuse heparin sub q injections.

## 2023-03-20 DIAGNOSIS — I120 Hypertensive chronic kidney disease with stage 5 chronic kidney disease or end stage renal disease: Secondary | ICD-10-CM | POA: Diagnosis not present

## 2023-03-20 LAB — GLUCOSE, CAPILLARY
Glucose-Capillary: 109 mg/dL — ABNORMAL HIGH (ref 70–99)
Glucose-Capillary: 124 mg/dL — ABNORMAL HIGH (ref 70–99)
Glucose-Capillary: 122 mg/dL — ABNORMAL HIGH (ref 70–99)

## 2023-03-20 NOTE — Progress Notes (Signed)
Patient refused vital signs and verbal aggressive throughout shift with staff. Patient requested to have rest of medication during th day shift  by the nurse that he had yesterday. Educated patient and redirected patient on importance of taking medications and having vital signs taken as ordered. Will continue to monitor.

## 2023-03-20 NOTE — Progress Notes (Signed)
Occupational Therapy Weekly Progress Note  Patient Details  Name: [name] [name] MRN: [id number] Date of Birth: [birth year]  Beginning of progress report period: March 13, 2023 End of progress report period: March 20, 2023   Patient has met 3 of 3 short term goals.  Patient is making slow but steady progress towards OT goals. His behaviors have improved throughout the week although he has been more lethargic due to meds needed to calm him down .He is at an overall mod A level for BADL tasks pending behavior. Continue current POC.  Patient continues to demonstrate the following deficits: muscle weakness, decreased cardiorespiratoy endurance and decreased oxygen support, decreased initiation, decreased attention, decreased awareness, decreased problem solving, decreased safety awareness, decreased memory, and delayed processing, and decreased sitting balance, decreased standing balance, decreased postural control, and decreased balance strategies and therefore will continue to benefit from skilled OT intervention to enhance overall performance with BADL and Reduce care partner burden.  Patient progressing toward long term goals..  Continue plan of care.  OT Short Term Goals Week 1:  OT Short Term Goal 1 (Week 1): Pt will will complete 1 step of UB dressing task OT Short Term Goal 1 - Progress (Week 1): Met OT Short Term Goal 2 (Week 1): Patient will stand with min A in preparation for BADL task OT Short Term Goal 2 - Progress (Week 1): Met OT Short Term Goal 3 (Week 1): Pt will wash two body parts with min cues OT Short Term Goal 3 - Progress (Week 1): Met Week 2:  OT Short Term Goal 1 (Week 2): Patient will complete toilet transfer with min A of 1 OT Short Term Goal 2 (Week 2): Patient will complete LB dressing with min A OT Short Term Goal 3 (Week 2): Patient will complete UB dressing with min A   Therapy/Group: Individual Therapy  [name] 03/20/2023, 1:52 PM

## 2023-03-20 NOTE — Patient Care Conference (Signed)
Inpatient RehabilitationTeam Conference and Plan of Care Update Date: 03/20/2023   Time:10:51 AM    Patient Name: [name] [name]      Medical Record Number: [id number]  Date of Birth: 11-Dec-1950 Sex: Male         Room/Bed: 4W13C/4W13C-01 Payor Info: Payor: MEDICARE / Plan: MEDICARE PART A AND B / Product Type: *No Product type* /    Admit Date/Time:  03/12/2023  4:25 PM  Primary Diagnosis:  Acute metabolic encephalopathy  Hospital Problems: Principal Problem:   Acute metabolic encephalopathy Active Problems:   Type 2 diabetes mellitus with chronic kidney disease on chronic dialysis, with long-term current use of insulin (HCC)   HFrEF (heart failure with reduced ejection fraction) (HCC)   Morbid obesity (HCC)   Encephalopathy   Epistaxis   PAF (paroxysmal atrial fibrillation) (HCC)   Long term (current) use of insulin (HCC)   ESRD on dialysis [facility])   Debility    Expected Discharge Date: Expected Discharge Date: 03/30/23  Team Members Present: Physician leading conference: Dr. Faith Rogue Social Worker Present: Cecile Sheerer, LCSWA Nurse Present: Vedia Pereyra, RN PT Present: Malachi Pro, PT OT Present: Kearney Hard, OT SLP Present: Feliberto Gottron, SLP PPS Coordinator present : Edson Snowball, PT     Current Status/Progress Goal Weekly Team Focus  Bowel/Bladder    Oliguric with LBM 09/01 incontinent of both    Regain control of bowel/bladder  Toilet every 2/3 hours to improve countenance    Swallow/Nutrition/ Hydration   regular/thin liquids   further assessment of swallow safety/participation  participation in further SLP therapy assessment of diet tolerance    ADL's   Min/Mod A overall   min A/supervision   barriers are behaviors, cognitive retraining, activity tolernace, self-care retraining,    Mobility   minA bed mob, transfers, min/modA gait up to 32'   minA ambulatory household distances, [facility] for community  barriers: agitation; continue working  on gait training, global strengthening and endurance    Communication   behavior/pragmatic deficits when communicating needs to staff   supervision   use of call bell to indicate wants/needs    Safety/Cognition/ Behavioral Observations  mod-maxA for memory, attention, problem solving, orientation, and awareness   minA   participation, use of meomry aids, use of call bell to communicate needs    Pain    Occasional complaints of back pain that is chronic   Less than 4 with PRN medications  Assess every 4 hours and PRN    Skin    Skin is clean, dry, and intact   Remain intact and free of infection/breakdown  Assess every shift and PRN      Discharge Planning:  D/c plan remains to be to home with his wife who is primary caregiver. No other natural supports. Wife would like forhim to be able to perform his own self-care but will complete if necessary. States she is unable to physically lift him. She will transport to/from dialysis- [facility] in [place] for TRS schedule . SW will confirm there are no barriers to discharge.   Team Discussion: Acute metabolic encephalopathy. HD: T/Th/Sat. Fistula LUE. Behavior plan in place. Enclosure bed.  Tele-sitter. Time toileting. Sleep chart. Daily weight. Tolerating renal/carb-mod diet with 1200 fluid restrictions. AC/HS. Oxygen via [place] to maintain SATs between 89-99%. Working on endurance, behaviors, and global strengthening, and use of call bell. Discharge extended from 09/09 to 09/13. Working on [employer].  Patient on target to meet rehab goals: yes, continues to progress towards  goals with discharge date of 03/30/23  *See Care Plan and progress notes for long and short-term goals.   Revisions to Treatment Plan:  U/A still needed.  Medication adjustments. Monitor daily weights. Monitor labs/VS Teaching Needs: Medications, safety, self care, behaviors/mood, gait/transfer training, etc.   Current Barriers to Discharge: Decreased caregiver  support, Incontinence, Hemodialysis, Medication compliance, and Behavior  Possible Resolutions to Barriers: Family education Regain control of bladder/bowel Adheres to medication compliance.  Self monitors behavior/mood Order recommended DME     Medical Summary Current Status: severe agitation. on geodon and other mood stabilizing meds. trying to avoid oversedation. on HD when able to participate from a behavioral standpoint  Barriers to Discharge: Medical stability;Behavior/Mood   Possible Resolutions to [name]: optimizing medication regimen. enclosure bed for safety. behavioral plan by team. ongoing HD   Continued Need for Acute Rehabilitation Level of Care: The patient requires daily medical management by a physician with specialized training in physical medicine and rehabilitation for the following reasons: Direction of a multidisciplinary physical rehabilitation program to maximize functional independence : Yes Medical management of patient stability for increased activity during participation in an intensive rehabilitation regime.: Yes Analysis of laboratory values and/or radiology reports with any subsequent need for medication adjustment and/or medical intervention. : Yes   I attest that I was present, lead the team conference, and concur with the assessment and plan of the team.   [name] 03/20/2023, 1:46 PM

## 2023-03-20 NOTE — Progress Notes (Signed)
Physical Therapy Session Note  Patient Details  Name: [name] [name] MRN: [id number] Date of Birth: 1951/04/12  Today's Date: 03/20/2023 PT Individual Time: 1020-1054 PT Individual Time Calculation (min): 34 min   Short Term Goals: Week 1:  PT Short Term Goal 1 (Week 1): Pt will complete bed mobility with min assist PT Short Term Goal 2 (Week 1): Pt will complete transfers with mod assist consistently PT Short Term Goal 3 (Week 1): Pt will ambulate with LRAD mod assist 52'  Skilled Therapeutic Interventions/Progress Updates:    Pt received sitting in w/c asleep with his wife, [name], present and upon awakening pt agreeable to therapy session. Nurse present to redress L UE bandage and provide medication, which increased pt arousal. Pt received on room air and nurse reports pt continues to remove nasal cannula - SpO2 88-92% while sitting. Sit>stand w/c>R HHA with heavy min A and pt having muscle fatigue in B LEs with minor LEs shaking; therefore, returning to sit in w/c. Sit>stand again and attempted to initiate gait training using R HHA for ~65ft and pt continues to have crouched posture and reporting his legs are weak/fatigued - pt's wife able to provide w/c behind pt for seated rest break. Provided pt with RW to attempt safe participation in gait training and pt made it ~28ft with skilled min/mod A for balance and AD management as pt with significant forward trunk flexed posture and pushing AD excessively far forward with gradual worsening crouched posture due to LE fatigue - +2 able to bring w/c up behind pt for safety seated rest break. Pt quick to fall back asleep once seated and unsafe to continue further attempts at participation in standing/ambulatory tasks, but unable to sustain arousal for seated tasks. Transported back to room in w/c. Reassessed SpO2 to be ~85% while resting seated in w/c therefore therapist re-donned the nasal cannula with 2L of O2 (provided pt with new O2 line as it appears pt  may have chewed a hole in the previous line). Pt's SpO2 recovered to 99-100% with 2L of O2 via nasal cannula. R stand pivot w/c>EOB with min A for balance. Sit>supine with pt initially leaning straight back with poor awareness and orientation to the bed requiring heavy max A to return to sitting and reposition correctly in bed.  At end of session, pt left asleep, supine in enclosure bed with needs in reach, lines intact, and pt's wife present.   Therapy Documentation Precautions:  Precautions Precautions: Fall Restrictions Weight Bearing Restrictions: No   Pain:  No reports of pain but does report feeling nauseous after nurse provided protein medication.    Therapy/Group: Individual Therapy  Ginny Forth , PT, DPT, NCS, CSRS 03/20/2023, 7:53 AM

## 2023-03-20 NOTE — Procedures (Addendum)
HD Note:  Some information was entered later than the data was gathered due to patient care needs. The stated time with the data is accurate.  Received patient in bed to unit.  Alarm belt applied by staff on the patients floor.  Patient placed in 4 point restraints before the initation of treatment for his safety.  These restraints will be removed when treatment is complete  Patient was alert for pain only.    Informed consent signed and in chart.   Access used: Upper left arm fistula Access issues: None  O2 at 2L/M was applied at the start of treatment for comfort.  O2 sats were high 80s to low 90s prior to the start of O2.  SBP below 100, no uf on at the beginning of the treatment. Rogers Blocker PA and Dr. Lamar Laundry notified with instructions to lower the uf goal to 2000 mil.  Patient was unable to follow directions when awakened with a sternal rub to drink through a straw, no midodrine was able to be given. UF had to be turned off a few more times, see flowsheet  Patient rested with eyes closed for the duration of the treatment with response only to pain.  To ask him a question, this writer had to do sternal rub to get him to respond.  He did not stay alert for more than a few min. Restraints removed at the end of treatment. Restraints were checked every 30 min.  TX duration: 3.5 hours . Total UF removed: 1500 ml  Hand-off given to patient's nurse.   Transported back to the room   Mellie Buccellato L. Dareen Piano, RN Kidney Dialysis Unit.

## 2023-03-20 NOTE — Progress Notes (Signed)
Physical Therapy Session Note  Patient Details  Name: [name] [name] MRN: [id number] Date of Birth: 12-Apr-1951  Today's Date: 03/20/2023 PT Individual Time: 0902-1000 PT Individual Time Calculation (min): 58 min   Short Term Goals: Week 1:  PT Short Term Goal 1 (Week 1): Pt will complete bed mobility with min assist PT Short Term Goal 2 (Week 1): Pt will complete transfers with mod assist consistently PT Short Term Goal 3 (Week 1): Pt will ambulate with LRAD mod assist 33'   Skilled Therapeutic Interventions/Progress Updates:  Patient seated upright in w/c on entrance to room. Reaching forward toward floor for something he dropped and safety monitor requesting pt to stop leaning forward and to sit back in chair to allow someone else to get item for pt. Toothpick flosser on floor and provided to pt who puts on tray table. Suggested to throw away d/t time on floor but pt prefers to have on tray table. Patient alert and agreeable to PT session.   Patient with no pain complaint at start of session.  Therapeutic Activity: Gait Training:  Pt ambulated 15 ft using R HHA requiring Min/ ModA for balance. Demonstrated WBOS and continuous reaching out for items to steady self. Does not listen to vc to maintain upright posture and not reach for items that are not steady. Turns to sit and initiates descent to sit far from w/c and with completing only half of turn to armchair. Requires ModA to safely reach seat. Stand pivot to w/c with minA and disregard to learned technique.  Neuromuscular Re-ed: NMR facilitated during session with focus on standing balance and safe techniques for transfers with improved balance. Pt brought to therapy gym for blocked practice of sit<>stand transfers. Pt demos extended hold to armrests and attempts to stand by pushing hips forward but cannot attain balance. Placed RW in front of pt and pt then attempting to hold to RW to stand with no awareness of movement of RW.  Positioned RW far in front of pt with request to stand. Pt does demo ability to rise to stand with good steadiness with WBOS and push from armrests to reach COM over BOS. Next transfer performed with more posterior foot positioning and guided in increased forward lean with one hand on armrest. Improved technique but pt not fully compliant and continues to require MinA to complete.   Transfers:   Car transfer performed with pt grabbing to door to assist to steady self. Stands in prep to step into car but provided with vc to fully turn, then sit and pivot Bil feet into footwell. Pt grasps again to door on exit of vehicle and requires block to door despite vc to use any other part of door frame or seat to assist. MinA overall to complete.   Pt provided with w/c cushion for improved comfort but now with increased height and less foot to floor contact.    NMR performed for improvements in motor control and coordination, balance, sequencing, judgement, and self confidence/ efficacy in performing all aspects of mobility at highest level of independence.   Patient seated upright in w/c at end of session with brakes locked, belt alarm set, and all needs within reach. Wife arriving with breakfast from [employer]. Alerted wife that pt completed breakfast tray from hospital kitchen. Continues to setup breakfast for pt.    Therapy Documentation Precautions:  Precautions Precautions: Fall Restrictions Weight Bearing Restrictions: No  Pain: Pain Assessment Pain Scale: 0-10 Pain Score: 0-No pain indicated this session.  Therapy/Group: Individual Therapy  Loel Dubonnet PT, DPT, CSRS 03/20/2023, 10:27 AM

## 2023-03-20 NOTE — Progress Notes (Signed)
Pt's case discussed with nephrologist and renal PA today. Will need to to see how pt does with HD treatments to make sure pt is appropriate for out-pt HD at d/c. Pt's tentative out-pt HD schedule will be discussed with pt's wife once it is know how pt is progressing. Will assist as needed.   Olivia Canter Renal Navigator [phone]

## 2023-03-20 NOTE — Plan of Care (Signed)
After speaking to the care team and physician in conference, the patient's plan of care has been adjusted to 15/7 as the patient is unable to tolerate his current therapy schedule with OT, PT, and SLP.    Feliberto Gottron, MA, CCC-SLP

## 2023-03-20 NOTE — Progress Notes (Signed)
Speech Language Pathology Weekly Progress and Session Note  Patient Details  Name: [name] [name] MRN: [id number] Date of Birth: Nov 27, 1950  Beginning of progress report period: March 13, 2023 End of progress report period: March 20, 2023  Today's Date: 03/20/2023 SLP Individual Time: 0802-0900 SLP Individual Time Calculation (min): 58 min  Short Term Goals: Week 1: SLP Short Term Goal 1 (Week 1): Patient will demonstrate intellectual awareness of deficits via verbally stating 2 cognitive and 2 physical impairments given maxA SLP Short Term Goal 1 - Progress (Week 1): Met SLP Short Term Goal 2 (Week 1): Patient will participate in bedside swallow evaluation to determine least restrictive safe diet and need for compensatory strategies SLP Short Term Goal 2 - Progress (Week 1): Met SLP Short Term Goal 3 (Week 1): Patient will utilize external aids to orient to time and situation given modA SLP Short Term Goal 3 - Progress (Week 1): Not met SLP Short Term Goal 4 (Week 1): Patient will recall basic daily information and events in 80% of opportunities events given maxA SLP Short Term Goal 4 - Progress (Week 1): Not met SLP Short Term Goal 5 (Week 1): Patient will communicate wants and needs respectfully to nursing staff utilizing provided room mechanisms (i.e. call bell) given modA SLP Short Term Goal 5 - Progress (Week 1): Not met SLP Short Term Goal 6 (Week 1): Patient will participate in ongoing assessment of diet tolerance with SLP to determine effects of medications and comorbidities on true swallowing function. SLP Short Term Goal 6 - Progress (Week 1): Met  New Short Term Goals: Week 2: SLP Short Term Goal 1 (Week 2): STGs=LTGs 2* ELOS  Weekly Progress Updates: Patient is making gains towards therapy goals, meeting 3/6 short term goals set this reporting period. Current biggest barrier to goal attainment is participation, with patient missing large chunks of minutes 2* reduced  participation in therapy sessions throughout the week. During most recent session, patient participated freely. Patient is currently tolerating regular/thin liquid diet with no difficulty provided he is alert during mealtimes. Patient continues to require maxA to verbalize appropriate method of contacting nursing and is observed to raise voice to call nursing staff via shouting. Patient required maxA to verbalize 2 cognitive deficits and 2 physical deficits. He verbalizes knowledge of walking deficit independently. Patient continues to require totalA to utilize room aids for orientation to time but is oriented to location independently and requires only supervision-minA to orient to situation. Patient continues to require totalA to recall daily events. Patient and family education ongoing. Patient will continue to benefit from skilled therapy services during remainder of CIR stay.    Intensity: Minumum of 1-2 x/day, 30 to 90 minutes Frequency: 3 to 5 out of 7 days Duration/Length of Stay: 9/9 Treatment/Interventions: Cognitive remediation/compensation;Environmental controls;Cueing hierarchy;Functional tasks;Therapeutic Activities;Internal/external aids;Patient/family education  Daily Session  Skilled Therapeutic Interventions: SLP conducted skilled therapy session targeting swallowing and cognitive goals. Patient continues to exhibit reduced orientation, requiring maxA to orient to date and day of the week as well as frequent repetition. Improvement noted in orientation to year. Patient distracted this session by dressing for dialysis port and reported he would not participate in any "thinking" tasks until it was fixed. SLP alerted nurse to patient's discomfort and RN entered to address dressing and administer medications. Patient took medications all at once with thin liquids with no overt s/sx of penetration/aspiration nor difficulty. SLP and RN then assisted patient with transfer to wheelchair for  breakfast with  patient demonstrating reduced judgement, safety, knowledge of safe movement per safety plan, and problem solving skills. Once in chair, patient recalled need for seat safety belt. During regular/thin liquid breakfast tray, patient exhibited reduced problem solving skills resulting in spillage of food and large bites, however this did not appear to impact swallow safety. Meal tolerated without s/sx of penetration/aspiration. Recommend continuation of current diet. SLP assisted patient in recall of daily events and anticipation of events to come, utilizing daily therapy schedule as cognitive tool. Patient required maxA to utilize schedule to discuss upcoming therapy events. SLP provided patient with handout outlining all therapies and broad goals for each type. Patient utilized to answer basic questions re: daily events given maxA. Patient was left in chair with call bell in reach and chair alarm set. SLP will continue to target goals per plan of care.      Pain Pain Assessment Pain Scale: 0-10 Pain Score: 0-No pain  Therapy/Group: Individual Therapy  [name], M.A., CCC-SLP  [name] 03/20/2023, 8:54 AM

## 2023-03-20 NOTE — Progress Notes (Addendum)
PROGRESS NOTE   Subjective/Complaints:  Pt remains somewhat lethargic d/t meds but they help to reduce his agitation. He wouldn't participate in therapy this morning (SLP). Did sleep 6 hours last night but was confused per RN  Pt thought I was someone else this morning on rounds. Wanted to know when he was "getting out of here"  Objective:   No results found. Recent Labs    03/17/23 1450 03/18/23 0711  WBC 4.9 5.4  HGB 7.4* 7.2*  HCT 25.1* 24.5*  PLT 146* 142*   Recent Labs    03/17/23 1450  NA 134*  K 4.5  CL 90*  CO2 24  GLUCOSE 115*  BUN 49*  CREATININE 10.46*  CALCIUM 8.8*    Intake/Output Summary (Last 24 hours) at 03/20/2023 1154 Last data filed at 03/20/2023 1050 Gross per 24 hour  Intake 711 ml  Output --  Net 711 ml         Physical Exam: Vital Signs Blood pressure (!) 137/45, pulse 80, temperature 97.9 F (36.6 C), temperature source Oral, resp. rate 18, height 5\' 4"  (1.626 m), weight 99.1 kg, SpO2 98%.  Constitutional: No distress . Vital signs reviewed. In enclosure bed HEENT: NCAT, EOMI, oral membranes moist Neck: supple Cardiovascular: RRR without murmur. No JVD    Respiratory/Chest: CTA Bilaterally without wheezes or rales. Normal effort    GI/Abdomen: BS +, non-tender, sl distended, former PD site  Ext: no clubbing, cyanosis, or edema Psych: lethargic, distracted and restless at times. Non-agitated    Neurologic Exam:   Oriented to self, hospital. + lethargy + Memory deficit - ongoing + Poor insight + Mild L ptosis   Sensory exam: Intact to touch in all 4 extremities Motor exam: Antigravity and against resistance in all 4 extremities Coordination: Slight bilateral upper extremity tremor, intention, mild.    Musculoskeletal: No joint swelling noted No abnormal tone    Assessment/Plan: 1. Functional deficits which require 15 hr per week of interdisciplinary therapy in a  comprehensive inpatient rehab setting. Physiatrist is providing close team supervision and 24 hour management of active medical problems listed below. Physiatrist and rehab team continue to assess barriers to discharge/monitor patient progress toward functional and medical goals  Care Tool:  Bathing    Body parts bathed by patient: Right arm, Left arm, Chest, Abdomen, Front perineal area, Right upper leg, Buttocks, Left upper leg, Right lower leg, Left lower leg, Face         Bathing assist Assist Level: Total Assistance - Patient < 25% (due to pt refusing to assist)     Upper Body Dressing/Undressing Upper body dressing        Upper body assist Assist Level: Total Assistance - Patient < 25%    Lower Body Dressing/Undressing      Lower body dressing      What is the patient wearing?: Pants     Lower body assist Assist for lower body dressing: Total Assistance - Patient < 25%     Toileting Toileting    Toileting assist Assist for toileting: 2 Helpers     Transfers Chair/bed transfer  Transfers assist     Chair/bed transfer assist level:  Minimal Assistance - Patient > 75%     Locomotion Ambulation   Ambulation assist      Assist level: Minimal Assistance - Patient > 75% Assistive device: Hand held assist Max distance: 35'   Walk 10 feet activity   Assist     Assist level: Minimal Assistance - Patient > 75% Assistive device: Hand held assist   Walk 50 feet activity   Assist Walk 50 feet with 2 turns activity did not occur: Safety/medical concerns         Walk 150 feet activity   Assist Walk 150 feet activity did not occur: Safety/medical concerns         Walk 10 feet on uneven surface  activity   Assist Walk 10 feet on uneven surfaces activity did not occur: Safety/medical concerns         Wheelchair     Assist Is the patient using a wheelchair?: Yes Type of Wheelchair: Manual    Wheelchair assist level:  Dependent - Patient 0%      Wheelchair 50 feet with 2 turns activity    Assist        Assist Level: Dependent - Patient 0%   Wheelchair 150 feet activity     Assist      Assist Level: Dependent - Patient 0%   Blood pressure (!) 137/45, pulse 80, temperature 97.9 F (36.6 C), temperature source Oral, resp. rate 18, height 5\' 4"  (1.626 m), weight 99.1 kg, SpO2 98%.  Medical Problem List and Plan: 1. Functional deficits secondary to metabolic encephalopathy due to sepsis secondary to catheter peritonitis and hypoxemia              -patient may not shower             -ELOS/Goals: 16-18 days, PT/OT Sup, SLP min A -tentative discharge date 9-9            -Continue CIR therapies including PT, OT, and SLP. Interdisciplinary team conference today to discuss goals, barriers to discharge, and dc planning.    -Will review records from [facility] -- has worsened since approximately 8/22, possible 8/19, consistent with records from nursing at [facility] with current behavior.    -9/3 continue enclosure bed for safety   -reduced therapy to 15/7   2.  Antithrombotics: -DVT/anticoagulation:  Pharmaceutical: Heparin             -antiplatelet therapy: aspirin and Plavix   3. Pain Management: Tylenol as needed             -Lidoderm patches to back  -Elavil 25 mg at bedtime for nerve pain and sleep 8/27  -8/29: Pain well controlled on Elavil; DC lyrica, PRN Tylenol/oxy 2.5-5 mg d/t CKD  8-30: Appears patient has been getting pain medications together overnight along with antipsychotics, will discuss with nursing  8-31: Patient having an difficulty endorsing needs and not utilizing PRNs, scheduled oxycodone 5 mg 3 times daily and has 2.5 mg prn 3 times daily available.   9-1: Not utilizing PRNs, minimal complaints of pain, will discontinue scheduled given lethargy today..   4. Mood/Behavior/Sleep: LCSW to evaluate and provide emotional support             -continue Seroquel 50 mg TID              -clonazepam 0.5 mg TID prn             -Geodon 10 mg IM q 12 hours prn             -  antipsychotic agents: n/a  -8-27: Agitated by soft restraints in bed, transition to 4 side rails, Posey alarm, and TeleSitter for now.  Veil bed on standby if needed, however feel we should avoid this if possible due to likelihood of agitating patient further   - 8/28: Placed in veil bed; wrist restraints for dialysis. Scheduled Geodon 20 mg twice daily; discontinue Seroquel and clonazepam due to no perceivable benefit. Add as needed Zyprexa 5 mg twice daily p.o. or IM due to recent cardiac arrhythmias and concern for QTc prolongation. As needed ramelteon for sleep. Stop gabapentin due to possible side effect of agitation  - 8/28: No improvement except temporarily with Geodon; add depakote 125 mg q8h for ongoing aggitation, mood stabilization. Increase elavil to 50 mg at bedtime.  Workup ongoing.    - 8/29: EEG negative, labs normal. MRI pending. Sleeping well and calm since starting depakote and increase in elavil. Will monitor tonight and wean Geodon in AM if appropriate.   8-30: MRI showing chronic microvascular changes, otherwise normal.  Increasingly calm and appropriate during the day, with ongoing agitation at nighttime.  Will increase nighttime Geodon to 40 mg; will discuss sleep log with nursing, as it has not been performed.  8-31 no sleep overnight per sleep log, again getting agitated around 9 PM and 5 AM.  Not responsive to as needed Zyprexa, responsive to Ativan.  Attempted to wean off scheduled a.m. Geodon today, but patient became extremely agitated and unable to redirect, calm down with readministration of medication.  Resume Geodon 20 mg every morning/40 mg every afternoon, increased Elavil to 75 mg nightly, adding trazodone 50 mg nightly as needed.  P.o./IV Ativan 0.5 mg every 6 hours for agitation.  9-1: slept well overnight with trazodone and increased Elavil, no PRNs used.  Unfortunately,  oversedated today.  Discontinue scheduled pain medications as above, start wean of Elavil to 50 mg x2 days -> 25 mg x2 days -> 10 mg x2 days, off due to no apparent benefit and no complaints of pain.  Reduce Geodon from 20 mg every morning/40 mg every evening to 20 mg twice daily.  Continue trazodone as needed for sleep, as this seems to be the difference for a restful night yesterday.  If he remains overly sedated, would dose reduce and DC Depakote next.  Keep Ativan as needed d/t dialysis not having other options available.  9/2-3--non agitated but still sleepy   -dc'ed depakote as I doubt it's doing much at this dose anyway   -continue geodon at 20mg  bid   -scheduled trazodone at HS with back up dose if needed    -sleep was intermittent last night   -requires enclosure bed for safety 5. Neuropsych/cognition: This patient is not capable of making decisions on his own behalf   -recommend neuropsych consult   - 8/27: Ongoing aggitation; Consult hospitalist for further workup on potential contributors to encephalopathy. Pending UA, LA 2.1-> 1.4; otherwise negative so far -Delirium precautions, sleep log, Veiled bed for safety and to minimize restraints -Wife has expressed that she is not interested in palliative care at this time; hopefully with above measures patient can get dialysis in the next 48 hours to limit worsening uremic encephalopathy and medical decline    - 8/29: Aggitation much improved, able to get some dialysis last night and today. See above for medication changes. Calling wife tonight to discuss.   8-30: Still with moderate cognitive deficits, however appears once patient is less agitated he is better able  to concentrate and has better orientation.  MRI negative for acute findings, monitor.  9/3 agitation better. More sedated than anything at this point. Confused as well.    -depakote stopped   -continue other meds as rx'ed 6. Skin/Wound Care: Routine skin care checks              -sacral skin off-loading/protection   7. Fluids/Electrolytes/Nutrition: Strict Is and Os and follow-up chemistries per nephrology             -renal diet/ 1200 cc FR  - Renvela 800 mg 3 times daily for hyperphosphatemia added per nephrology 8-27   8: Hypotension: monitor TID and prn             -continue midodrine as needed during dialysis  Normotensive, monitor  9-1: Consistently 90s over 60s, has been stable but given findings of microvascular changes on MRI, may be too low for patient to perfuse properly.  Will add midodrine 5 mg tid and monitor.  DC Coreg as heart rate has been low to normal; may resume at low-dose once blood pressure improved.    03/19/2023    8:07 PM 03/19/2023    2:26 PM 03/19/2023    5:00 AM  Vitals with BMI  Weight   218 lbs 8 oz  BMI   37.49  Systolic 137 126   Diastolic 45 85   Pulse 80 68     9/2 continue with ccurrent regimen. Bp's hr remain low   -elavil wean should help   9: Hyperlipidemia: continue statin   10: CAD s/p CABG 2023: on DAPT and statin.  - troponin elevated but stable, monitor 8/27   11: DM-2: A1c = 6.3% (at home on Lantus 22 units at bedtime, Tradjenta 5 mg daily)        -monitor PO intake             -continue SSI (0-6 units) does not appear to be requiring coverage   - well controlled   Recent Labs    03/19/23 1650 03/19/23 2107 03/20/23 0642  GLUCAP 141* 108* 109*     12: ESRD: HD on Tues, Thurs, Sat -nephrology made aware   - Unable to tolerate 8/27 d/t aggitation, stopped early 8/28;  - attempting again 8/29, stopped early again -Per nephrology, redirectable with gum -Have to switch restraints during dialysis sessions from veil bed to regular bed, then resume veil bed order once at rehab   13: COPD/OSA; chronic home 2L O2 (Symbicort, Ventolin, prednisone 40 mg daily at home)             -continue symbicort  -Added home oxygen back 8-27   14: GERD: continue Protonix   15: Non-sustained VT/pAF: per cardiology, Dr.  Algie Coffer: -continue carvedilol 6.25 mg BID; consider increasing to 12.5 mg BID before starting low dose amiodarone  -consider Eliquis and discontinue asa and Plavix in one week if no active GI bleeding and stable hemoglobin from 8/24 -hemoglobin has remained stable but very low around 7, no recurrent A-fib, monitor for now given agitation/fall 8-31 and consider initiating Eliquis when safe  -Continue mag-ox to avoid hypomagnesemia -8-27: Single instance of bradycardia this morning 40s, may have been missed read, monitor through today and do not further adjust beta-blocker medications  -9-3 hr controlled after holding coreg 16: HFrEF: Daily weights. Monitor for signs of overload  - Weight  stable/decreased  Filed Weights   03/15/23 1811 03/16/23 0500 03/19/23 0500  Weight: 104.6 kg 105 kg  99.1 kg      17: Liver nodularity on CT scan: LFTs reported to be normal. Avoid hepatotoxic medications    -Abdominal ultrasound earlier this admission with minimal ascites; ammonia within normal limits  -8/31: Did discuss with nephrology whether p.o. lactulose would be beneficial despite normal ammonia, after reviewing records unsure that this would be beneficial    18: Epistaxis - resolved             -previously improved with flonase and saline spray   19: Morbid Obesity. Dietary counseling Body mass index is 39.01 kg/m.      LOS: 8 days A FACE TO FACE EVALUATION WAS PERFORMED  Ranelle Oyster 03/20/2023, 11:54 AM

## 2023-03-20 NOTE — Progress Notes (Signed)
Patient ID: ASIAH REYNEN, male   DOB: 1950-08-30, 72 y.o.   MRN: [id number]  SW met with pt and pt wife in room to provide updates from team conference, and d/c date 9/13.  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  She denies there are any other natural supports that can provide care. SW discussed in length limited support and possibly short rehab in SNF. She is open to an extension if he can be intermittent supervision level of care because she knows he will require supervision at all times. SW informed her concerns will be shared with medical team, and will continue to discuss best plan of care.   Cecile Sheerer, MSW, LCSWA Office: [phone] Cell: [phone] Fax: [phone]

## 2023-03-20 NOTE — Progress Notes (Addendum)
[facility] Progress Note   Subjective:   Pt seen in room, seated in wheelchair with therapy present. He is alert, calm and pleasant. Denies SOB, CP, dizziness, nausea. Reports he feels he had edema but it is improving.   Objective Vitals:   03/19/23 0500 03/19/23 1426 03/19/23 2007 03/19/23 2100  BP:  126/85 (!) 137/45   Pulse:  68 80   Resp:  17 18   Temp:  98 F (36.7 C) 97.9 F (36.6 C)   TempSrc:  Oral Oral   SpO2:  98% 100% 98%  Weight: 99.1 kg     Height:       Physical Exam General: Alert male in NAD Heart: RRR, no murmurs, rubs or gallops Lungs: CTA bilaterally, respirations unlabored on RA Abdomen: Soft, non-distended, +BS Extremities: Trace pedal edema Dialysis Access: LUE AVF +t/b  Additional Objective Labs: Basic Metabolic Panel: Recent Labs  Lab 03/15/23 1330 03/17/23 1450  NA 133* 134*  K 4.5 4.5  CL 96* 90*  CO2 24 24  GLUCOSE 136* 115*  BUN 48* 49*  CREATININE 10.85* 10.46*  CALCIUM 8.4* 8.8*  PHOS 5.4* 5.9*   Liver Function Tests: Recent Labs  Lab 03/15/23 1330 03/17/23 1450  ALBUMIN 1.6* 1.7*   No results for input(s): "LIPASE", "AMYLASE" in the last 168 hours. CBC: Recent Labs  Lab 03/15/23 1330 03/17/23 1450 03/18/23 0711  WBC 4.6 4.9 5.4  HGB 7.5* 7.4* 7.2*  HCT 24.5* 25.1* 24.5*  MCV 91.8 95.8 97.2  PLT 161 146* 142*   Blood Culture    Component Value Date/Time   SDES BLOOD RIGHT HAND 02/19/2023 1855   SPECREQUEST  02/19/2023 1855    BOTTLES DRAWN AEROBIC ONLY Blood Culture results may not be optimal due to an inadequate volume of blood received in culture bottles   CULT  02/19/2023 1855    NO GROWTH 5 DAYS Performed at [facility], [address], [place], [place] [address]    REPTSTATUS 02/24/2023 FINAL 02/19/2023 1855    Cardiac Enzymes: No results for input(s): "CKTOTAL", "CKMB", "CKMBINDEX", "TROPONINI" in the last 168 hours. CBG: Recent Labs  Lab 03/19/23 0625 03/19/23 1202  03/19/23 1650 03/19/23 2107 03/20/23 0642  GLUCAP 98 111* 141* 108* 109*   Iron Studies: No results for input(s): "IRON", "TIBC", "TRANSFERRIN", "FERRITIN" in the last 72 hours. @lablastinr3 @ Studies/Results: No results found. Medications:   (feeding supplement) PROSource Plus  30 mL Oral BID BM   acidophilus  1 capsule Oral Daily   amitriptyline  25 mg Oral QHS   Followed by   Melene Muller ON 03/22/2023] amitriptyline  10 mg Oral QHS   aspirin EC  81 mg Oral Daily   atorvastatin  80 mg Oral Daily   Chlorhexidine Gluconate Cloth  6 each Topical Q0600   clopidogrel  75 mg Oral Daily   [START ON 03/25/2023] darbepoetin (ARANESP) injection - DIALYSIS  150 mcg Subcutaneous Q Sun-1800   divalproex  125 mg Oral Q8H   feeding supplement  237 mL Oral BID WC   Gerhardt's butt cream  1 Application Topical QID   heparin  5,000 Units Subcutaneous Q8H   insulin aspart  0-6 Units Subcutaneous TID WC   lidocaine  1 patch Transdermal Q2200   lidocaine  1 patch Transdermal Q24H   magnesium oxide  400 mg Oral Daily   midodrine  5 mg Oral TID WC   mometasone-formoterol  2 puff Inhalation BID   multivitamin  1 tablet Oral  QHS   pantoprazole  40 mg Oral BID   sevelamer carbonate  800 mg Oral TID WC   traZODone  50 mg Oral QHS   ziprasidone  20 mg Oral BID WC    Outpatient Dialysis Orders:  Previously on [facility] thru [facility] - changed to HD - will need outpatient HD unit established prior to d/c.   Assessment/Plan: Debility: In CIR 2. Encephalopathy/agitation: Agitation worsened overnight requiring enclosure bed again. Unclear cause - present for past 1-2 weeks per wife (was happening at [facility] as well). Work-up ongoing, ammonia fine, brain MRI with no acute findings. Unable to dialyze 8/27 - was upset/yelling. We have frequently had to end HD early for his safety due to agitation.  Prn Ativan ordered while in HD. 3. ESRD:  Transitioned from PD to HD recently on TTS schedule. Using old AVF which  is still functional, s/p f'gram 02/28/23. Unable to dialyze 8/27, intermittently having to shorten HD, see above. 4. Hypertension/volume: BP stable. No respiratory issues.  Working to establish dry weight. Reinforced fluid restrictions.  5. Anemia: Hgb 7.2, tsat 38% with ferritin 2175.  No iron.  Increased aranesp to for 9/1 6. Metabolic bone disease: CorrCa and phos in goal.  On Renvela 1 AC TID. 7.  Nutrition:  Alb very low, continue supps.  Renal diet w/fluid restrictions.  8.  Hx NSVT: On Coreg 9.  Recent COVID July 2024 c/b HCAP s/p antibiotic course (Unasyn) on 8/21 10.  Recent PD related peritonitis (+TNC >2000, cx's neg) and related +fungemia/ enterococcus bacteremia - underwent PD cath removal, TDC placement and HD cath placement and line holiday. Completed IV unasyn, IV micafungin and cipro through 8/15. TDC not replaced cause he had functional AVF.    Rogers Blocker, PA-C 03/20/2023, 10:08 AM  [facility] Pager: [phone]  Pt seen, examined and agree w A/P as above.  Vinson Moselle MD  [facility] 03/20/2023, 4:26 PM

## 2023-03-20 NOTE — Plan of Care (Signed)
  Problem: Consults Goal: RH GENERAL PATIENT EDUCATION Description: See Patient Education module for education specifics. Outcome: Progressing   Problem: RH BOWEL ELIMINATION Goal: RH STG MANAGE BOWEL WITH ASSISTANCE Description: STG Manage Bowel with mod I Assistance. Outcome: Progressing Goal: RH STG MANAGE BOWEL W/MEDICATION W/ASSISTANCE Description: STG Manage Bowel with Medication with mod I Assistance. Outcome: Progressing   Problem: RH PAIN MANAGEMENT Goal: RH STG PAIN MANAGED AT OR BELOW PT'S PAIN GOAL Description: < 4 with prns Outcome: Progressing

## 2023-03-20 NOTE — Progress Notes (Signed)
Occupational Therapy Note  Patient Details  Name: BRAXDEN HAINLINE MRN: [id number] Date of Birth: [birth year]  Today's Date: 03/20/2023 OT Missed Time: 60 Minutes Missed Time Reason: Patient fatigue  Patient greeted semi-reclined asleep in enclosure bed. Per spouse, pt just laid down and was very fatigued.  His PT from the morning also mentioned pt was nauseous and tired. Pt too fatigued to participate in therapy at this time. Made a plan with p's wife for shower tomorrow when she arrives. Pt left in enclosure bed with needs met.    [name] Orville Mena 03/20/2023, 12:32 PM

## 2023-03-21 ENCOUNTER — Encounter (HOSPITAL_COMMUNITY): Payer: Medicare Other

## 2023-03-21 DIAGNOSIS — I120 Hypertensive chronic kidney disease with stage 5 chronic kidney disease or end stage renal disease: Secondary | ICD-10-CM | POA: Diagnosis not present

## 2023-03-21 LAB — GLUCOSE, CAPILLARY
Glucose-Capillary: 127 mg/dL — ABNORMAL HIGH (ref 70–99)
Glucose-Capillary: 124 mg/dL — ABNORMAL HIGH (ref 70–99)
Glucose-Capillary: 97 mg/dL (ref 70–99)

## 2023-03-21 MED ORDER — LORAZEPAM 0.5 MG PO TABS
0.5000 mg | ORAL_TABLET | Freq: Four times a day (QID) | ORAL | Status: DC | PRN
  Administered 2023-03-24 – 2023-04-07 (×22): 0.5 mg via ORAL
  Filled 2023-03-21 (×32): qty 1

## 2023-03-21 MED ORDER — LORAZEPAM 2 MG/ML IJ SOLN
0.5000 mg | Freq: Four times a day (QID) | INTRAMUSCULAR | Status: DC | PRN
  Administered 2023-03-29 – 2023-04-07 (×3): 0.5 mg via INTRAMUSCULAR
  Filled 2023-03-21 (×8): qty 1

## 2023-03-21 MED ORDER — CHLORHEXIDINE GLUCONATE CLOTH 2 % EX PADS
6.0000 | MEDICATED_PAD | Freq: Every day | CUTANEOUS | Status: DC
  Administered 2023-03-22 – 2023-03-25 (×4): 6 via TOPICAL

## 2023-03-21 NOTE — Progress Notes (Signed)
Patient has been agitated intermittently throughout the day. Dois Davenport PA notified. Care ongoing.    Rito Ehrlich, LPN

## 2023-03-21 NOTE — Progress Notes (Addendum)
Advised by rehab CSW that pt's d/c date has been changed to 9/13. Case discussed with renal PA. Will need to continue to see how pt does with HD to determine if pt appropriate for out-pt HD due to agitation. Contacted [facility] to provide an update regarding change in d/c date and that providers continue to see how pt does with HD. Will assist as needed.   Olivia Canter Renal Navigator [phone]  Addendum at 3:45 pm: Spoke to facility administrator at [facility] to provide update on pt's case. Navigator advised that new referral will have to be submitted to [facility] admissions once d/c plan confirmed and once it is determined pt is appropriate for out-pt HD. Pt has been out of the clinic for over 30 days therefore, new referral is needed. Update provided to rehab CSW and renal PA.

## 2023-03-21 NOTE — Progress Notes (Addendum)
Occupational Therapy Session Note  Patient Details  Name: [name] [name] MRN: [id number] Date of Birth: 17-Oct-1950  Today's Date: 03/21/2023 OT Individual Time: 1100-1143 OT Individual Time Calculation (min): 43 min  OT missed time: 17 min (Pt refusal)  Short Term Goals: Week 2:  OT Short Term Goal 1 (Week 2): Patient will complete toilet transfer with min A of 1 OT Short Term Goal 2 (Week 2): Patient will complete LB dressing with min A OT Short Term Goal 3 (Week 2): Patient will complete UB dressing with min A  Skilled Therapeutic Interventions/Progress Updates:     Pt received sitting up wc with wife present in room. Pt presenting to be calm upon OT arrival, receptive to skilled OT session reporting 0/10 pain- OT offering intermittent rest breaks, repositioning, and therapeutic support to optimize participation in therapy session. Planned to complete shower with Pt this session during family education to allow opportunity for wife to have hands-on practice. However, Pt unreceptive to completing shower this session- becoming irritable and upset yelling out at both OT and pt's wife when offering and encouraged to take shower. Provided gentle education and attempted to redirect Pt, however Pt continued to demonstrate irritable behavior. OT and Pt's wife allowed time and space for Pt to calm down and briefly exited room while OT educated Pt's wife on ABI recovery, Pt's POC, fall prevention, DME options, and discussed Pt's home set-up with Pt's wife verbalizing understanding. Pt's wife stating they have a tub shower with TTB set-up and standard shower head. Pt's home doorways are narrow and accessible via RW, but not wc. Provided education on option installing HHSH to decrease bourdon of care during shower. Re-entered Pt's room with Pt presenting to be calm and receptive to practicing TTB- transported Pt total A to tube room in wc for time management and energy conservation. Upon arriving to tub room,  Pt refusing to complete transfer d/t it being too cold in BR- provided therapeutic support and encouragement, however Pt continued to refuse and yell out. Provided demonstration of technique for completing TTB transfer for caregiver education with pt's wife verbalizing understanding, however Pt's wife continued to demonstrate concern with assisting Pt with transfer. Educated Pt's wife on importance of attending multiple therapy sessions for increased education to increase safety and prevent falls with Pt's wife receptive to come in for additional education. Transported pt back to room total A in wc with Pt requesting to return to bed and declining to complete remainder of therapy session despite maximal therapeutic support and encouragement provided. Pt completed stand step using RW with mod A d/t Pt becoming confused and reaching out for vail of bed, sink, etc. Once sitting EOB, Pt becoming upset and requesting to return to chair with mod A and max verbal cues required for transfer d/t motor planning and attention deficits. Pt was left resting in wc with call bell in reach, seat alarm on, tele-sitter on, and all needs met. Nurse tech entering room upon OT departure. Missed 17 minutes of skilled OT treatment d/t Pt refusal to continue session- will attempt to make up time as schedule allows.   Therapy Documentation Precautions:  Precautions Precautions: Fall Restrictions Weight Bearing Restrictions: No  Therapy/Group: Individual Therapy  [name] [name] 03/21/2023, 8:03 AM

## 2023-03-21 NOTE — Progress Notes (Signed)
SLP Cancellation Note  Patient Details Name: [name] [name] MRN: [id number] DOB: Sep 01, 1950  Cancelled treatment:        SLP attempted to conduct family education with wife and sister present. Upon entry, patient on verge of agitation with PT reporting patient refusal just prior to start of SLP session. SLP began with cognitive education, however soon into session, patient became verbally agitated, threatening to hit staff and family. Patient removed safety belt despite copious cues from visitors and therapist to replace. Patient perseverative on returning home and session was spent ensuring patient safety, thus skilled intervention not conducted. Patient eventually left with LPN, charge nurse, sister, and social worker in room. SLP will continue with plan of care as able.                                                                                             [name], M.A., CCC-SLP  [name] 03/21/2023, 3:53 PM

## 2023-03-21 NOTE — Progress Notes (Signed)
Patient ID: [name] [name], male   DOB: [birth year], 72 y.o.   MRN: [id number]  SW received updates from [facility]- dialysis coord reporting reporting she spoke with [facility] and she will have to submit a new referral to [facility] once we know final d/c plan and if pt appropriate for out-pt HD. Pt has been out of clinic for over 30 days so a new referral is needed.   SW updated medical team.  Cecile Sheerer, MSW, LCSWA Office: [phone] Cell: [phone] Fax: [phone]

## 2023-03-21 NOTE — Plan of Care (Signed)
  Problem: Consults Goal: RH GENERAL PATIENT EDUCATION Description: See Patient Education module for education specifics. Outcome: Progressing   Problem: RH BOWEL ELIMINATION Goal: RH STG MANAGE BOWEL WITH ASSISTANCE Description: STG Manage Bowel with mod I Assistance. Outcome: Progressing Goal: RH STG MANAGE BOWEL W/MEDICATION W/ASSISTANCE Description: STG Manage Bowel with Medication with mod I Assistance. Outcome: Progressing   Problem: RH SAFETY Goal: RH STG ADHERE TO SAFETY PRECAUTIONS W/ASSISTANCE/DEVICE Description: STG Adhere to Safety Precautions With cues Assistance/Device. Outcome: Progressing

## 2023-03-21 NOTE — Progress Notes (Addendum)
[facility] Progress Note   Subjective:   Pt was somnolent throughout HD yesterday, only awakening to voice. Today he is seated in wheelchair, NAD. Reports he was nauseated this AM so he did not go to therapy. Denies SOB, dizziness, CP.   Objective Vitals:   03/20/23 1813 03/20/23 1821 03/20/23 2020 03/21/23 0648  BP: 105/68 113/70 116/74 (!) 98/48  Pulse: 72 71 90 73  Resp: 14 18 18 20   Temp:   98.3 F (36.8 C) 98.6 F (37 C)  TempSrc:   Oral Oral  SpO2: 100% 96% 99% 99%  Weight:      Height:       Physical Exam General: Alert male in NAD Heart: RRR, no murmurs, rubs or gallops Lungs: CTA bilaterally Abdomen: Soft, non-distended, +BS Extremities: No edema b/l lower extremities Dialysis Access: AVF + t/b  Additional Objective Labs: Basic Metabolic Panel: Recent Labs  Lab 03/15/23 1330 03/17/23 1450  NA 133* 134*  K 4.5 4.5  CL 96* 90*  CO2 24 24  GLUCOSE 136* 115*  BUN 48* 49*  CREATININE 10.85* 10.46*  CALCIUM 8.4* 8.8*  PHOS 5.4* 5.9*   Liver Function Tests: Recent Labs  Lab 03/15/23 1330 03/17/23 1450  ALBUMIN 1.6* 1.7*   No results for input(s): "LIPASE", "AMYLASE" in the last 168 hours. CBC: Recent Labs  Lab 03/15/23 1330 03/17/23 1450 03/18/23 0711  WBC 4.6 4.9 5.4  HGB 7.5* 7.4* 7.2*  HCT 24.5* 25.1* 24.5*  MCV 91.8 95.8 97.2  PLT 161 146* 142*   Blood Culture    Component Value Date/Time   SDES BLOOD RIGHT HAND 02/19/2023 1855   SPECREQUEST  02/19/2023 1855    BOTTLES DRAWN AEROBIC ONLY Blood Culture results may not be optimal due to an inadequate volume of blood received in culture bottles   CULT  02/19/2023 1855    NO GROWTH 5 DAYS Performed at [facility], [address] [place], [place] [address]    REPTSTATUS 02/24/2023 FINAL 02/19/2023 1855    Cardiac Enzymes: No results for input(s): "CKTOTAL", "CKMB", "CKMBINDEX", "TROPONINI" in the last 168 hours. CBG: Recent Labs  Lab 03/19/23 1650  03/19/23 2107 03/20/23 0642 03/20/23 1157 03/20/23 2039  GLUCAP 141* 108* 109* 124* 122*   Iron Studies: No results for input(s): "IRON", "TIBC", "TRANSFERRIN", "FERRITIN" in the last 72 hours. @lablastinr3 @ Studies/Results: No results found. Medications:   (feeding supplement) PROSource Plus  30 mL Oral BID BM   acidophilus  1 capsule Oral Daily   amitriptyline  25 mg Oral QHS   Followed by   Melene Muller ON 03/22/2023] amitriptyline  10 mg Oral QHS   aspirin EC  81 mg Oral Daily   atorvastatin  80 mg Oral Daily   Chlorhexidine Gluconate Cloth  6 each Topical Q0600   clopidogrel  75 mg Oral Daily   [START ON 03/25/2023] darbepoetin (ARANESP) injection - DIALYSIS  150 mcg Subcutaneous Q Sun-1800   divalproex  125 mg Oral Q8H   feeding supplement  237 mL Oral BID WC   Gerhardt's butt cream  1 Application Topical QID   heparin  5,000 Units Subcutaneous Q8H   insulin aspart  0-6 Units Subcutaneous TID WC   lidocaine  1 patch Transdermal Q2200   lidocaine  1 patch Transdermal Q24H   magnesium oxide  400 mg Oral Daily   midodrine  5 mg Oral TID WC   mometasone-formoterol  2 puff Inhalation BID   multivitamin  1 tablet Oral  QHS   pantoprazole  40 mg Oral BID   sevelamer carbonate  800 mg Oral TID WC   traZODone  50 mg Oral QHS   ziprasidone  20 mg Oral BID WC    Dialysis Orders:  Previously on [facility] thru [facility] - changed to HD - will need outpatient HD unit established prior to d/c.   Assessment/Plan: Debility: In CIR 2. Encephalopathy/agitation: Agitation worsened overnight requiring enclosure bed again. Unclear cause - present for past 1-2 weeks per wife (was happening at [facility] as well). Work-up ongoing, ammonia fine, brain MRI with no acute findings. Unable to dialyze 8/27 - was upset/yelling. We have frequently had to end HD early for his safety due to agitation.  Prn Ativan ordered while in HD. 3. ESRD:  Transitioned from PD to HD recently on TTS schedule. Using old AVF  which is still functional, s/p f'gram 02/28/23. Unable to dialyze 8/27, intermittently having to shorten HD, see above. 4. Hypertension/volume: BP soft with some hypotension. No respiratory issues.  Working to establish dry weight. Reinforced fluid restrictions.  5. Anemia: Hgb 7.2, tsat 38% with ferritin 2175.  No iron.  Increased aranesp to for 9/1.  6. Metabolic bone disease: CorrCa and phos in goal.  On Renvela 1 AC TID.Labs were not checked yesterday, ordered with HD tomorrow.  7.  Nutrition:  Alb very low, continue supps.  Renal diet w/fluid restrictions.  8.  Hx NSVT: On Coreg 10.  Recent PD related peritonitis (+TNC >2000, cx's neg) and related +fungemia/ enterococcus bacteremia - underwent PD cath removal, TDC placement and HD cath placement and line holiday. Completed IV unasyn, IV micafungin and cipro through 8/15. TDC not replaced cause he had functional AVF.   11. Dispo: Patient has been either agitated or somnolent on HD lately. He will need to be able to dialyze without use of restraints and without being so sedated that he is not awakening to voice, etc in order to be safe for outpatient dialysis.     Rogers Blocker, PA-C 03/21/2023, 11:18 AM  [facility] Pager: [phone]  Pt seen, examined and agree w assess/plan as above with additions as indicated. Agree patient not ready for discharge given agitation and needs for acute medication to be able to do regular dialysis. Has had some periods of better mentation this last 24 hrs. Hopefully with time this will resolve.  Rob [facility] 03/21/2023, 1:32 PM

## 2023-03-21 NOTE — Progress Notes (Signed)
Physical Therapy Session Note  Patient Details  Name: [name] [name] MRN: [id number] Date of Birth: 10-14-1950  Today's Date: 03/21/2023 PT Individual Time: 1305-1400 PT Individual Time Calculation (min): 55 min   Short Term Goals: Week 1:  PT Short Term Goal 1 (Week 1): Pt will complete bed mobility with min assist PT Short Term Goal 2 (Week 1): Pt will complete transfers with mod assist consistently PT Short Term Goal 3 (Week 1): Pt will ambulate with LRAD mod assist 63'   Skilled Therapeutic Interventions/Progress Updates:  Patient seated upright in w/c on entrance to room. Pt's wife present. Patient alert and not agreeable to PT session relating that he's not going to walk today.   Patient with no pain complaint at start of session.  Agitation increases throughout session with attempts to decrease agitation, redirect, and encourage participation in order to demonstrate pt's CLOF to wife. ~62min into session, pt's sister arrives for education as well.  Education provided re: pt's brain injury and low awareness as to his actions/ reactions. Best practice is to exhibit patience and positivity while also being honest  and continuing to encourage involvement with redirection following negative comments/ actions. Wife and sister demonstrate understanding, however wife not able to implement during session.   Chart reviewed for any new medications or recent changes to medications in order to relate to wife potential for drug involvement, however nothing noted in chart.   Informed CSW re: pt's current state and inability to participate with wife and sister present. SW arrives to room in attempt to encourage involvement, however pt continues to demo refusal to participate and rising agitation.   Pt relates shoulder pain as reason for not participating and wants blankets throughout session attempting to put hole in blanket, then sheet (with use of an inkpen) in order to "make hole for my head".  K-pad plugged in and set for heat. CSW and PT recommended k-pad placement to help pain and temperature. Pt continues to refuse. Okay with donning of security belt and k-pad placed on back at same time.   Patient seated upright in w/c at end of session with brakes locked, belt alarm set, and all needs within reach. St entering for continued family education.    Therapy Documentation Precautions:  Precautions Precautions: Fall Restrictions Weight Bearing Restrictions: No  Pain: Pt relates contradicting info re: pain in L shoulder.   Therapy/Group: Individual Therapy  Loel Dubonnet PT, DPT, CSRS 03/21/2023, 11:33 PM

## 2023-03-21 NOTE — Progress Notes (Signed)
PROGRESS NOTE   Subjective/Complaints:  No acute events overnight.  He had hemodialysis yesterday.  Reports he wants to empty his bladder-nursing was notified.  No new concerns were elicited.  ROS: Limited by cognition  Objective:   No results found. No results for input(s): "WBC", "HGB", "HCT", "PLT" in the last 72 hours.  No results for input(s): "NA", "K", "CL", "CO2", "GLUCOSE", "BUN", "CREATININE", "CALCIUM" in the last 72 hours.   Intake/Output Summary (Last 24 hours) at 03/21/2023 0840 Last data filed at 03/21/2023 0839 Gross per 24 hour  Intake 712 ml  Output 1500 ml  Net -788 ml         Physical Exam: Vital Signs Blood pressure (!) 98/48, pulse 73, temperature 98.6 F (37 C), temperature source Oral, resp. rate 20, height 5\' 4"  (1.626 m), weight 99.1 kg, SpO2 99%.  Constitutional: No distress . Vital signs reviewed. In enclosure bed HEENT: NCAT, conjugate gaze, oral membranes moist Neck: supple Cardiovascular: RRR without murmur. No JVD    Respiratory/Chest: CTA Bilaterally without wheezes or rales. Normal effort    GI/Abdomen: BS +, non-tender, sl distended, former PD site  Ext: no clubbing, cyanosis, or edema Psych: Awake and alert distracted and restless at times.  Neurologic Exam:   Oriented to self, hospital + Memory deficit - ongoing + Poor insight + Mild L ptosis   Sensory exam: Intact to touch in all 4 extremities Motor exam: Antigravity and against resistance in all 4 extremities Coordination: Slight bilateral upper extremity tremor, intention, mild.    Musculoskeletal: No joint swelling noted No abnormal tone    Assessment/Plan: 1. Functional deficits which require 15 hr per week of interdisciplinary therapy in a comprehensive inpatient rehab setting. Physiatrist is providing close team supervision and 24 hour management of active medical problems listed below. Physiatrist and rehab team  continue to assess barriers to discharge/monitor patient progress toward functional and medical goals  Care Tool:  Bathing    Body parts bathed by patient: Right arm, Left arm, Chest, Abdomen, Front perineal area, Right upper leg, Buttocks, Left upper leg, Right lower leg, Left lower leg, Face         Bathing assist Assist Level: Maximal Assistance - Patient 24 - 49%     Upper Body Dressing/Undressing Upper body dressing        Upper body assist Assist Level: Moderate Assistance - Patient 50 - 74%    Lower Body Dressing/Undressing      Lower body dressing      What is the patient wearing?: Pants     Lower body assist Assist for lower body dressing: Maximal Assistance - Patient 25 - 49%     Toileting Toileting    Toileting assist Assist for toileting: Maximal Assistance - Patient 25 - 49%     Transfers Chair/bed transfer  Transfers assist     Chair/bed transfer assist level: Minimal Assistance - Patient > 75%     Locomotion Ambulation   Ambulation assist      Assist level: Minimal Assistance - Patient > 75% Assistive device: Hand held assist Max distance: 35'   Walk 10 feet activity   Assist  Assist level: Minimal Assistance - Patient > 75% Assistive device: Hand held assist   Walk 50 feet activity   Assist Walk 50 feet with 2 turns activity did not occur: Safety/medical concerns         Walk 150 feet activity   Assist Walk 150 feet activity did not occur: Safety/medical concerns         Walk 10 feet on uneven surface  activity   Assist Walk 10 feet on uneven surfaces activity did not occur: Safety/medical concerns         Wheelchair     Assist Is the patient using a wheelchair?: Yes Type of Wheelchair: Manual    Wheelchair assist level: Dependent - Patient 0%      Wheelchair 50 feet with 2 turns activity    Assist        Assist Level: Dependent - Patient 0%   Wheelchair 150 feet activity      Assist      Assist Level: Dependent - Patient 0%   Blood pressure (!) 98/48, pulse 73, temperature 98.6 F (37 C), temperature source Oral, resp. rate 20, height 5\' 4"  (1.626 m), weight 99.1 kg, SpO2 99%.  Medical Problem List and Plan: 1. Functional deficits secondary to metabolic encephalopathy due to sepsis secondary to catheter peritonitis and hypoxemia              -patient may not shower             -ELOS/Goals: 16-18 days, PT/OT Sup, SLP min A -tentative discharge date 9-9            -Continue CIR therapies including PT, OT, and SLP. Interdisciplinary team conference today to discuss goals, barriers to discharge, and dc planning.    -Will review records from [facility] -- has worsened since approximately 8/22, possible 8/19, consistent with records from nursing at [facility] with current behavior.    -9/3 continue enclosure bed for safety   -reduced therapy to 15/7  -May need SNF as wife reports she cannot provide 24/7 care, Est discharge 9/13 currently   2.  Antithrombotics: -DVT/anticoagulation:  Pharmaceutical: Heparin             -antiplatelet therapy: aspirin and Plavix   3. Pain Management: Tylenol as needed             -Lidoderm patches to back  -Elavil 25 mg at bedtime for nerve pain and sleep 8/27  -8/29: Pain well controlled on Elavil; DC lyrica, PRN Tylenol/oxy 2.5-5 mg d/t CKD  8-30: Appears patient has been getting pain medications together overnight along with antipsychotics, will discuss with nursing  8-31: Patient having an difficulty endorsing needs and not utilizing PRNs, scheduled oxycodone 5 mg 3 times daily and has 2.5 mg prn 3 times daily available.   9-1: Not utilizing PRNs, minimal complaints of pain, will discontinue scheduled given lethargy today..   4. Mood/Behavior/Sleep: LCSW to evaluate and provide emotional support             -continue Seroquel 50 mg TID             -clonazepam 0.5 mg TID prn             -Geodon 10 mg IM q 12 hours  prn             -antipsychotic agents: n/a  -8-27: Agitated by soft restraints in bed, transition to 4 side rails, Posey alarm, and TeleSitter for now.  Veil bed  on standby if needed, however feel we should avoid this if possible due to likelihood of agitating patient further   - 8/28: Placed in veil bed; wrist restraints for dialysis. Scheduled Geodon 20 mg twice daily; discontinue Seroquel and clonazepam due to no perceivable benefit. Add as needed Zyprexa 5 mg twice daily p.o. or IM due to recent cardiac arrhythmias and concern for QTc prolongation. As needed ramelteon for sleep. Stop gabapentin due to possible side effect of agitation  - 8/28: No improvement except temporarily with Geodon; add depakote 125 mg q8h for ongoing aggitation, mood stabilization. Increase elavil to 50 mg at bedtime.  Workup ongoing.    - 8/29: EEG negative, labs normal. MRI pending. Sleeping well and calm since starting depakote and increase in elavil. Will monitor tonight and wean Geodon in AM if appropriate.   8-30: MRI showing chronic microvascular changes, otherwise normal.  Increasingly calm and appropriate during the day, with ongoing agitation at nighttime.  Will increase nighttime Geodon to 40 mg; will discuss sleep log with nursing, as it has not been performed.  8-31 no sleep overnight per sleep log, again getting agitated around 9 PM and 5 AM.  Not responsive to as needed Zyprexa, responsive to Ativan.  Attempted to wean off scheduled a.m. Geodon today, but patient became extremely agitated and unable to redirect, calm down with readministration of medication.  Resume Geodon 20 mg every morning/40 mg every afternoon, increased Elavil to 75 mg nightly, adding trazodone 50 mg nightly as needed.  P.o./IV Ativan 0.5 mg every 6 hours for agitation.  9-1: slept well overnight with trazodone and increased Elavil, no PRNs used.  Unfortunately, oversedated today.  Discontinue scheduled pain medications as above, start wean of  Elavil to 50 mg x2 days -> 25 mg x2 days -> 10 mg x2 days, off due to no apparent benefit and no complaints of pain.  Reduce Geodon from 20 mg every morning/40 mg every evening to 20 mg twice daily.  Continue trazodone as needed for sleep, as this seems to be the difference for a restful night yesterday.  If he remains overly sedated, would dose reduce and DC Depakote next.  Keep Ativan as needed d/t dialysis not having other options available.  9/2-3--non agitated but still sleepy   -dc'ed depakote as I doubt it's doing much at this dose anyway   -continue geodon at 20mg  bid   -scheduled trazodone at HS with back up dose if needed    -sleep was intermittent last night   -requires enclosure bed for safety  9/4 continue enclosure bed for now, consider trying DC of this device early next week 5. Neuropsych/cognition: This patient is not capable of making decisions on his own behalf   -recommend neuropsych consult   - 8/27: Ongoing aggitation; Consult hospitalist for further workup on potential contributors to encephalopathy. Pending UA, LA 2.1-> 1.4; otherwise negative so far -Delirium precautions, sleep log, Veiled bed for safety and to minimize restraints -Wife has expressed that she is not interested in palliative care at this time; hopefully with above measures patient can get dialysis in the next 48 hours to limit worsening uremic encephalopathy and medical decline    - 8/29: Aggitation much improved, able to get some dialysis last night and today. See above for medication changes. Calling wife tonight to discuss.   8-30: Still with moderate cognitive deficits, however appears once patient is less agitated he is better able to concentrate and has better orientation.  MRI negative  for acute findings, monitor.  9/3 agitation better. More sedated than anything at this point. Confused as well.    -depakote stopped   -continue other meds as rx'ed  9/4 alert this AM, continue  current regimen 6.  Skin/Wound Care: Routine skin care checks             -sacral skin off-loading/protection   7. Fluids/Electrolytes/Nutrition: Strict Is and Os and follow-up chemistries per nephrology             -renal diet/ 1200 cc FR  - Renvela 800 mg 3 times daily for hyperphosphatemia added per nephrology 8-27   8: Hypotension: monitor TID and prn             -continue midodrine as needed during dialysis  Normotensive, monitor  9-1: Consistently 90s over 60s, has been stable but given findings of microvascular changes on MRI, may be too low for patient to perfuse properly.  Will add midodrine 5 mg tid and monitor.  DC Coreg as heart rate has been low to normal; may resume at low-dose once blood pressure improved.     03/21/2023    6:48 AM 03/21/2023    5:00 AM 03/20/2023    8:20 PM  Vitals with BMI  Weight  --   Systolic 98  116  Diastolic 48  74  Pulse 73  90    9/2 continue with ccurrent regimen. Bp's hr remain low   -elavil wean should help  9/4 Bps remain soft at times, continue midodrine     9: Hyperlipidemia: continue statin   10: CAD s/p CABG 2023: on DAPT and statin.  - troponin elevated but stable, monitor 8/27   11: DM-2: A1c = 6.3% (at home on Lantus 22 units at bedtime, Tradjenta 5 mg daily)        -monitor PO intake             -continue SSI (0-6 units) does not appear to be requiring coverage   - 9/4   Recent Labs    03/20/23 0642 03/20/23 1157 03/20/23 2039  GLUCAP 109* 124* 122*     12: ESRD: HD on Tues, Thurs, Sat -nephrology made aware   - Unable to tolerate 8/27 d/t aggitation, stopped early 8/28;  - attempting again 8/29, stopped early again -Per nephrology, redirectable with gum -Have to switch restraints during dialysis sessions from veil bed to regular bed, then resume veil bed order once at rehab   13: COPD/OSA; chronic home 2L O2 (Symbicort, Ventolin, prednisone 40 mg daily at home)             -continue symbicort  -Added home oxygen back 8-27   14: GERD:  continue Protonix   15: Non-sustained VT/pAF: per cardiology, Dr. Algie Coffer: -continue carvedilol 6.25 mg BID; consider increasing to 12.5 mg BID before starting low dose amiodarone  -consider Eliquis and discontinue asa and Plavix in one week if no active GI bleeding and stable hemoglobin from 8/24 -hemoglobin has remained stable but very low around 7, no recurrent A-fib, monitor for now given agitation/fall 8-31 and consider initiating Eliquis when safe  -Continue mag-ox to avoid hypomagnesemia -8-27: Single instance of bradycardia this morning 40s, may have been missed read, monitor through today and do not further adjust beta-blocker medications  -9-3 hr controlled after holding coreg -9/4 HR stable, continue to monitor  16: HFrEF: Daily weights. Monitor for signs of overload  - Weight  stable/decreased  Filed Weights   03/15/23 1811  03/16/23 0500 03/19/23 0500  Weight: 104.6 kg 105 kg 99.1 kg      17: Liver nodularity on CT scan: LFTs reported to be normal. Avoid hepatotoxic medications    -Abdominal ultrasound earlier this admission with minimal ascites; ammonia within normal limits  -8/31: Did discuss with nephrology whether p.o. lactulose would be beneficial despite normal ammonia, after reviewing records unsure that this would be beneficial    18: Epistaxis - resolved             -previously improved with flonase and saline spray   19: Morbid Obesity. Dietary counseling Body mass index is 39.01 kg/m.      LOS: 9 days A FACE TO FACE EVALUATION WAS PERFORMED  Fanny Dance 03/21/2023, 8:40 AM

## 2023-03-22 DIAGNOSIS — I120 Hypertensive chronic kidney disease with stage 5 chronic kidney disease or end stage renal disease: Secondary | ICD-10-CM | POA: Diagnosis not present

## 2023-03-22 DIAGNOSIS — I959 Hypotension, unspecified: Secondary | ICD-10-CM

## 2023-03-22 LAB — RENAL FUNCTION PANEL
Albumin: 2.2 g/dL — ABNORMAL LOW (ref 3.5–5.0)
Anion gap: 11 (ref 5–15)
BUN: 43 mg/dL — ABNORMAL HIGH (ref 8–23)
CO2: 28 mmol/L (ref 22–32)
Calcium: 9 mg/dL (ref 8.9–10.3)
Chloride: 96 mmol/L — ABNORMAL LOW (ref 98–111)
Creatinine, Ser: 9.84 mg/dL — ABNORMAL HIGH (ref 0.61–1.24)
GFR, Estimated: 5 mL/min — ABNORMAL LOW (ref >60)
Glucose, Bld: 139 mg/dL — ABNORMAL HIGH (ref 70–99)
Phosphorus: 4.3 mg/dL (ref 2.5–4.6)
Potassium: 4.6 mmol/L (ref 3.5–5.1)
Sodium: 135 mmol/L (ref 135–145)

## 2023-03-22 LAB — CBC
HCT: 29.7 % — ABNORMAL LOW (ref 39.0–52.0)
Hemoglobin: 8.8 g/dL — ABNORMAL LOW (ref 13.0–17.0)
MCH: 28.9 pg (ref 26.0–34.0)
MCHC: 29.6 g/dL — ABNORMAL LOW (ref 30.0–36.0)
MCV: 97.7 fL (ref 80.0–100.0)
Platelets: 192 10*3/uL (ref 150–400)
RBC: 3.04 MIL/uL — ABNORMAL LOW (ref 4.22–5.81)
RDW: 20.4 % — ABNORMAL HIGH (ref 11.5–15.5)
WBC: 5 10*3/uL (ref 4.0–10.5)
nRBC: 0.8 % — ABNORMAL HIGH (ref 0.0–0.2)

## 2023-03-22 LAB — GLUCOSE, CAPILLARY
Glucose-Capillary: 91 mg/dL (ref 70–99)
Glucose-Capillary: 108 mg/dL — ABNORMAL HIGH (ref 70–99)
Glucose-Capillary: 118 mg/dL — ABNORMAL HIGH (ref 70–99)

## 2023-03-22 MED ORDER — ANTICOAGULANT SODIUM CITRATE 4% (200MG/5ML) IV SOLN: 5.0000 mL | Status: DC | PRN

## 2023-03-22 MED ORDER — HEPARIN SODIUM (PORCINE) 1000 UNIT/ML DIALYSIS: 2000.0000 [IU] | INTRAMUSCULAR | Status: DC | PRN

## 2023-03-22 MED ORDER — ENOXAPARIN SODIUM 30 MG/0.3ML IJ SOSY
30.0000 mg | PREFILLED_SYRINGE | INTRAMUSCULAR | Status: DC
  Administered 2023-03-26 – 2023-03-28 (×2): 30 mg via SUBCUTANEOUS
  Filled 2023-03-22 (×2): qty 0.3

## 2023-03-22 MED ORDER — ALTEPLASE 2 MG IJ SOLR: 2.0000 mg | Freq: Once | INTRAMUSCULAR | Status: DC | PRN

## 2023-03-22 MED ORDER — MUSCLE RUB 10-15 % EX CREA: TOPICAL_CREAM | CUTANEOUS | Status: DC | PRN

## 2023-03-22 MED ORDER — PENTAFLUOROPROP-TETRAFLUOROETH EX AERO: 1.0000 | INHALATION_SPRAY | CUTANEOUS | Status: DC | PRN

## 2023-03-22 MED ORDER — LIDOCAINE-PRILOCAINE 2.5-2.5 % EX CREA: 1.0000 | TOPICAL_CREAM | CUTANEOUS | Status: DC | PRN

## 2023-03-22 MED ORDER — HEPARIN SODIUM (PORCINE) 1000 UNIT/ML DIALYSIS: 1000.0000 [IU] | INTRAMUSCULAR | Status: DC | PRN

## 2023-03-22 MED ORDER — LIDOCAINE HCL (PF) 1 % IJ SOLN: 5.0000 mL | INTRAMUSCULAR | Status: DC | PRN

## 2023-03-22 MED ORDER — HEPARIN SODIUM (PORCINE) 1000 UNIT/ML DIALYSIS
2000.0000 [IU] | INTRAMUSCULAR | Status: DC | PRN
  Administered 2023-03-22: 2000 [IU] via INTRAVENOUS_CENTRAL
  Filled 2023-03-22: qty 2

## 2023-03-22 NOTE — Progress Notes (Signed)
[facility] Progress Note   Subjective:   Pt seen in room, just got back in bed after working with therapy. He reports he is doing well. Denies SOB, CP, dizziness, nausea.   Objective Vitals:   03/21/23 0648 03/21/23 1447 03/21/23 1946 03/22/23 0540  BP: (!) 98/48 (!) 153/97 117/84 108/76  Pulse: 73 75 76 72  Resp: 20 16 16 16   Temp: 98.6 F (37 C) 98.7 F (37.1 C) 98.3 F (36.8 C) 98.3 F (36.8 C)  TempSrc: Oral Oral Oral   SpO2: 99% 93% 91% 95%  Weight:      Height:       Physical Exam General: Alert male in NAD Lungs: Respirations unlabored Abdomen: non-distended Extremities: No edema b/l lower extremities Dialysis Access: AVF + t/b  Additional Objective Labs: Basic Metabolic Panel: Recent Labs  Lab 03/15/23 1330 03/17/23 1450  NA 133* 134*  K 4.5 4.5  CL 96* 90*  CO2 24 24  GLUCOSE 136* 115*  BUN 48* 49*  CREATININE 10.85* 10.46*  CALCIUM 8.4* 8.8*  PHOS 5.4* 5.9*   Liver Function Tests: Recent Labs  Lab 03/15/23 1330 03/17/23 1450  ALBUMIN 1.6* 1.7*   No results for input(s): "LIPASE", "AMYLASE" in the last 168 hours. CBC: Recent Labs  Lab 03/15/23 1330 03/17/23 1450 03/18/23 0711  WBC 4.6 4.9 5.4  HGB 7.5* 7.4* 7.2*  HCT 24.5* 25.1* 24.5*  MCV 91.8 95.8 97.2  PLT 161 146* 142*   Blood Culture    Component Value Date/Time   SDES BLOOD RIGHT HAND 02/19/2023 1855   SPECREQUEST  02/19/2023 1855    BOTTLES DRAWN AEROBIC ONLY Blood Culture results may not be optimal due to an inadequate volume of blood received in culture bottles   CULT  02/19/2023 1855    NO GROWTH 5 DAYS Performed at [facility], [address] [place], [place] [address]    REPTSTATUS 02/24/2023 FINAL 02/19/2023 1855    Cardiac Enzymes: No results for input(s): "CKTOTAL", "CKMB", "CKMBINDEX", "TROPONINI" in the last 168 hours. CBG: Recent Labs  Lab 03/20/23 2039 03/21/23 1143 03/21/23 1643 03/21/23 2014 03/22/23 0556  GLUCAP 122* 127*  124* 97 91   Iron Studies: No results for input(s): "IRON", "TIBC", "TRANSFERRIN", "FERRITIN" in the last 72 hours. @lablastinr3 @ Studies/Results: No results found. Medications:   (feeding supplement) PROSource Plus  30 mL Oral BID BM   acidophilus  1 capsule Oral Daily   amitriptyline  10 mg Oral QHS   aspirin EC  81 mg Oral Daily   atorvastatin  80 mg Oral Daily   Chlorhexidine Gluconate Cloth  6 each Topical Q0600   Chlorhexidine Gluconate Cloth  6 each Topical Q0600   clopidogrel  75 mg Oral Daily   [START ON 03/25/2023] darbepoetin (ARANESP) injection - DIALYSIS  150 mcg Subcutaneous Q Sun-1800   divalproex  125 mg Oral Q8H   feeding supplement  237 mL Oral BID WC   Gerhardt's butt cream  1 Application Topical QID   heparin  5,000 Units Subcutaneous Q8H   insulin aspart  0-6 Units Subcutaneous TID WC   lidocaine  1 patch Transdermal Q2200   lidocaine  1 patch Transdermal Q24H   magnesium oxide  400 mg Oral Daily   midodrine  5 mg Oral TID WC   mometasone-formoterol  2 puff Inhalation BID   multivitamin  1 tablet Oral QHS   pantoprazole  40 mg Oral BID   sevelamer carbonate  800 mg Oral  TID WC   traZODone  50 mg Oral QHS   ziprasidone  20 mg Oral BID WC    Dialysis Orders: Previously on [facility] thru [facility] - changed to HD - will need outpatient HD unit established prior to d/c.   Assessment/Plan: Debility: In CIR 2. Encephalopathy/agitation: Agitation worsened overnight requiring enclosure bed again. Unclear cause - present for past 1-2 weeks per wife (was happening at [facility] as well). Work-up ongoing, ammonia fine, brain MRI with no acute findings. Unable to dialyze 8/27 - was upset/yelling. We have frequently had to end HD early for his safety due to agitation.  Prn Ativan ordered while in HD. 3. ESRD:  Transitioned from PD to HD recently on TTS schedule. Using old AVF which is still functional, s/p f'gram 02/28/23. Unable to dialyze 8/27, intermittently having to  shorten HD, see above. 4. Hypertension/volume: BP soft with some hypotension. No respiratory issues.  Working to establish dry weight. Reinforced fluid restrictions.  5. Anemia: Hgb 7.2, tsat 38% with ferritin 2175.  No iron.  Increased aranesp to for 9/1. Checking CBC today 6. Metabolic bone disease: CorrCa and phos in goal.  On Renvela 1 AC TID.Labs were not checked yesterday, ordered with HD tomorrow.  7.  Nutrition:  Alb very low, continue supps.  Renal diet w/fluid restrictions.  8.  Hx NSVT: On Coreg 10.  Recent PD related peritonitis (+TNC >2000, cx's neg) and related +fungemia/ enterococcus bacteremia - underwent PD cath removal, TDC placement and HD cath placement and line holiday. Completed IV unasyn, IV micafungin and cipro through 8/15. TDC not replaced cause he had functional AVF.   11. Dispo: Patient has been either agitated or somnolent on HD lately. He will need to be able to dialyze without use of restraints and without being so sedated that he is not awakening to voice, etc in order to be safe for outpatient dialysis.     Rogers Blocker, PA-C 03/22/2023, 8:56 AM  [facility] Pager: [phone]

## 2023-03-22 NOTE — Progress Notes (Signed)
Physical Therapy Weekly Progress Note  Patient Details  Name: [name] [name] MRN: [id number] Date of Birth: 1950-07-26  Beginning of progress report period: March 13, 2023 End of progress report period: March 22, 2023  Today's Date: 03/22/2023 PT Individual Time: 0902-0945 PT Individual Time Calculation (min): 43 min   Patient has met 3 of 3 short term goals.  Pt is progressing well toward mobiility goals, improving independence with bed mobility, balance, transfers, and ambulation. Pt has also been significantly less agitated and more cooperative with staff. Pt is performing bed mobility with supervision, and transfers and ambulation typically with minA for stability and safety. Pt has significant endurance deficits, as well as safety awareness/cognitive deficits, and will require hands on family education prior to discharge.   Patient continues to demonstrate the following deficits muscle weakness, decreased cardiorespiratoy endurance, decreased initiation, decreased attention, decreased awareness, decreased problem solving, decreased safety awareness, and decreased memory, and decreased sitting balance, decreased standing balance, decreased postural control, and decreased balance strategies and therefore will continue to benefit from skilled PT intervention to increase functional independence with mobility.  Patient progressing toward long term goals..  Continue plan of care.  PT Short Term Goals Week 1:  PT Short Term Goal 1 (Week 1): Pt will complete bed mobility with min assist PT Short Term Goal 1 - Progress (Week 1): Met PT Short Term Goal 2 (Week 1): Pt will complete transfers with mod assist consistently PT Short Term Goal 2 - Progress (Week 1): Met PT Short Term Goal 3 (Week 1): Pt will ambulate with LRAD mod assist 50' PT Short Term Goal 3 - Progress (Week 1): Met Week 2:  PT Short Term Goal 1 (Week 2): STGs = LTGs  Skilled Therapeutic Interventions/Progress Updates:      Pt received seated in WC at RN station. Agrees to therapy and no complaint of pain. WC transport to gym. Pt performs stand step transfer to Nustep with minA and facilitation of anterior weight shift, sequencing, and safe positioning. Pt completes Nustep for endurance training, reciprocal coordination, and challenging attention to task. Pt completes x10:00 at workload of 5 with average step per minute ~35. PT provides cues for hand and foot placement and completing full available ROM. Pt takes extended seated rest break, verbalizing fatigue. Pt then ambulates x120' with minA and Rt HHA, requiring x2 standing rest breaks holding onto Lt hand rail. PT provides cues for upright posture and stability, as well as sequencing for safe transition back to WC. Pt performs stand step back to bed with minA. Left supine in posey bed with call bell in reach.   Therapy Documentation Precautions:  Precautions Precautions: Fall Restrictions Weight Bearing Restrictions: No   Therapy/Group: Individual Therapy  Beau Fanny, PT, DPT 03/22/2023, 4:45 PM

## 2023-03-22 NOTE — Plan of Care (Signed)
  Problem: Consults Goal: RH GENERAL PATIENT EDUCATION Description: See Patient Education module for education specifics. Outcome: Progressing   Problem: RH BOWEL ELIMINATION Goal: RH STG MANAGE BOWEL WITH ASSISTANCE Description: STG Manage Bowel with mod I Assistance. Outcome: Progressing Goal: RH STG MANAGE BOWEL W/MEDICATION W/ASSISTANCE Description: STG Manage Bowel with Medication with mod I Assistance. Outcome: Progressing   Problem: RH SAFETY Goal: RH STG ADHERE TO SAFETY PRECAUTIONS W/ASSISTANCE/DEVICE Description: STG Adhere to Safety Precautions With cues Assistance/Device. Outcome: Progressing   Problem: RH PAIN MANAGEMENT Goal: RH STG PAIN MANAGED AT OR BELOW PT'S PAIN GOAL Description: < 4 with prns Outcome: Progressing   Problem: RH KNOWLEDGE DEFICIT GENERAL Goal: RH STG INCREASE KNOWLEDGE OF SELF CARE AFTER HOSPITALIZATION Description: Patient and wife will be able to manage care at discharge with educational resources independently Outcome: Progressing   Problem: Safety: Goal: Non-violent Restraint(s) Outcome: Progressing   Problem: Education: Goal: Ability to describe self-care measures that may prevent or decrease complications (Diabetes Survival Skills Education) will improve Outcome: Progressing Goal: Individualized Educational Video(s) Outcome: Progressing   Problem: Coping: Goal: Ability to adjust to condition or change in health will improve Outcome: Progressing   Problem: Fluid Volume: Goal: Ability to maintain a balanced intake and output will improve Outcome: Progressing   Problem: Health Behavior/Discharge Planning: Goal: Ability to identify and utilize available resources and services will improve Outcome: Progressing Goal: Ability to manage health-related needs will improve Outcome: Progressing   Problem: Metabolic: Goal: Ability to maintain appropriate glucose levels will improve Outcome: Progressing   Problem: Nutritional: Goal:  Maintenance of adequate nutrition will improve Outcome: Progressing Goal: Progress toward achieving an optimal weight will improve Outcome: Progressing   Problem: Skin Integrity: Goal: Risk for impaired skin integrity will decrease Outcome: Progressing   Problem: Tissue Perfusion: Goal: Adequacy of tissue perfusion will improve Outcome: Progressing

## 2023-03-22 NOTE — Progress Notes (Signed)
The patient has been repeatedly refusing doses of heparin subcu for DVT prophylaxis.  Discussed Lovenox dosing with pharmacist.  We will start Lovenox 30 mg daily this morning to hopefully improve compliance.

## 2023-03-22 NOTE — Progress Notes (Signed)
Speech Language Pathology Note  Patient Details  Name: [name] [name] MRN: [id number] Date of Birth: Sep 06, 1950  Today's Date: 03/22/2023 SLP Individual Time: 1006-1030 SLP Individual Time Calculation (min): 24 min  Short Term Goals: Week 2: SLP Short Term Goal 1 (Week 2): STGs=LTGs 2* ELOS  Skilled Therapeutic Interventions: SLP conducted skilled therapy session targeting cognitive retraining. Patient received in veil bed asleep. Patient difficult to rouse, however awoke with repositioning into upright position. Patient oriented to month but not to date, requiring mod-maxA to utilize external aids. SLP and patient reviewed therapy schedule with patient requiring maxA to identify names of therapists and times of session. Patient indicated he was cold, asking for warm blanket and assistance with repositioning of legs. During entire session, patient transiently in and out of states of wakefulness requiring frequent auditory and tactile cues to remain engaged, however after ~30 minutes, patient fell asleep and was difficult to rouse, thus session was truncated. Patient was left in lowered bed with call bell in reach and veil bed enclosed. SLP will continue to target goals per plan of care.       Pain Pain Assessment Pain Scale: 0-10 Pain Score: 0-No pain  Agitated Behavior Scale: Observation Details Observation Environment: CIR Start of observation period - Date: 03/22/23 Start of observation period - Time: 1006 End of observation period - Date: 03/22/23 End of observation period - Time: 1030 Agitated Behavior Scale (DO NOT LEAVE BLANKS) Short attention span, easy distractibility, inability to concentrate: Present to a moderate degree Impulsive, impatient, low tolerance for pain or frustration: Absent Uncooperative, resistant to care, demanding: Absent Violent and/or threatening violence toward people or property: Absent Explosive and/or unpredictable anger: Absent Rocking, rubbing,  moaning, or other self-stimulating behavior: Absent Pulling at tubes, restraints, etc.: Absent Wandering from treatment areas: Absent Restlessness, pacing, excessive movement: Absent Repetitive behaviors, motor, and/or verbal: Absent Rapid, loud, or excessive talking: Absent Sudden changes of mood: Absent Easily initiated or excessive crying and/or laughter: Absent Self-abusiveness, physical and/or verbal: Absent Agitated behavior scale total score: 16  Therapy/Group: Individual Therapy  [name], M.A., CCC-SLP  [name] 03/22/2023, 11:13 AM

## 2023-03-22 NOTE — Progress Notes (Signed)
Occupational Therapy Session Note  Patient Details  Name: [name] [name] MRN: [id number] Date of Birth: 1951-06-21  Today's Date: 03/22/2023 OT Individual Time: 1120-1202 OT Individual Time Calculation (min): 42 min   Short Term Goals: Week 2:  OT Short Term Goal 1 (Week 2): Patient will complete toilet transfer with min A of 1 OT Short Term Goal 2 (Week 2): Patient will complete LB dressing with min A OT Short Term Goal 3 (Week 2): Patient will complete UB dressing with min A  Skilled Therapeutic Interventions/Progress Updates:    Pt greeted semi-reclined in enclosure bed asleep. Pt needed increased time and tactile cues to wake up, but was pleasant and agreeable once awake. Pt needed mod A to get to sitting EOB, then stood w/ RW and pivot to wc with minA. Pt brought to the sink for grooming tasks. Pt initiated and sequenced tasks with only min cues for task termination. Declined need to go to the bathroom.  Pt agreeable to then go for a walk. Pt ambulated 40 feet w/ RW x 3 with seated rest breaks, RW and CGA/min. Pt left seated at nurses station with alarm belt on and needs met.   Therapy Documentation Precautions:  Precautions Precautions: Fall Restrictions Weight Bearing Restrictions: No Pain: Pain Assessment Pain Scale: 0-10 Pain Score: 0-No pain   Therapy/Group: Individual Therapy  [name] 03/22/2023, 12:11 PM

## 2023-03-22 NOTE — Progress Notes (Addendum)
PROGRESS NOTE   Subjective/Complaints:  Intermittent agitation last night.  Working with therapy this morning, Reports some right order soreness.  ROS: Limited by cognition.  Denies chest pain or shortness of breath  Objective:   No results found. No results for input(s): "WBC", "HGB", "HCT", "PLT" in the last 72 hours.  No results for input(s): "NA", "K", "CL", "CO2", "GLUCOSE", "BUN", "CREATININE", "CALCIUM" in the last 72 hours.   Intake/Output Summary (Last 24 hours) at 03/22/2023 0850 Last data filed at 03/21/2023 1752 Gross per 24 hour  Intake 424 ml  Output --  Net 424 ml         Physical Exam: Vital Signs Blood pressure 108/76, pulse 72, temperature 98.3 F (36.8 C), resp. rate 16, height 5\' 4"  (1.626 m), weight 99.1 kg, SpO2 95%.  Constitutional: No distress . Vital signs reviewed.  Working with therapy in the gym. HEENT: NCAT, conjugate gaze, oral membranes moist Neck: supple Cardiovascular: RRR without murmur. No JVD    Respiratory/Chest: CTA Bilaterally without wheezes or rales. Normal effort    GI/Abdomen: BS +, non-tender, sl distended, former PD site  Ext: no clubbing, cyanosis, or edema Psych: Awake and alert, not agitated this morning  Neurologic Exam:   Oriented to self, hospital + Memory deficit - ongoing + Poor insight + Mild L ptosis   Sensory exam: Intact to touch in all 4 extremities Motor exam: Antigravity and against resistance in all 4 extremities Coordination: Slight bilateral upper extremity tremor, intention, mild.    Musculoskeletal: No joint swelling noted No abnormal tone  Minimal right shoulder, trapezius tenderness.   Assessment/Plan: 1. Functional deficits which require 15 hr per week of interdisciplinary therapy in a comprehensive inpatient rehab setting. Physiatrist is providing close team supervision and 24 hour management of active medical problems listed  below. Physiatrist and rehab team continue to assess barriers to discharge/monitor patient progress toward functional and medical goals  Care Tool:  Bathing    Body parts bathed by patient: Right arm, Left arm, Chest, Abdomen, Front perineal area, Right upper leg, Buttocks, Left upper leg, Right lower leg, Left lower leg, Face         Bathing assist Assist Level: Maximal Assistance - Patient 24 - 49%     Upper Body Dressing/Undressing Upper body dressing        Upper body assist Assist Level: Moderate Assistance - Patient 50 - 74%    Lower Body Dressing/Undressing      Lower body dressing      What is the patient wearing?: Pants     Lower body assist Assist for lower body dressing: Maximal Assistance - Patient 25 - 49%     Toileting Toileting    Toileting assist Assist for toileting: Maximal Assistance - Patient 25 - 49%     Transfers Chair/bed transfer  Transfers assist     Chair/bed transfer assist level: Minimal Assistance - Patient > 75%     Locomotion Ambulation   Ambulation assist      Assist level: Minimal Assistance - Patient > 75% Assistive device: Hand held assist Max distance: 35'   Walk 10 feet activity   Assist  Assist level: Minimal Assistance - Patient > 75% Assistive device: Hand held assist   Walk 50 feet activity   Assist Walk 50 feet with 2 turns activity did not occur: Safety/medical concerns         Walk 150 feet activity   Assist Walk 150 feet activity did not occur: Safety/medical concerns         Walk 10 feet on uneven surface  activity   Assist Walk 10 feet on uneven surfaces activity did not occur: Safety/medical concerns         Wheelchair     Assist Is the patient using a wheelchair?: Yes Type of Wheelchair: Manual    Wheelchair assist level: Dependent - Patient 0%      Wheelchair 50 feet with 2 turns activity    Assist        Assist Level: Dependent - Patient 0%    Wheelchair 150 feet activity     Assist      Assist Level: Dependent - Patient 0%   Blood pressure 108/76, pulse 72, temperature 98.3 F (36.8 C), resp. rate 16, height 5\' 4"  (1.626 m), weight 99.1 kg, SpO2 95%.  Medical Problem List and Plan: 1. Functional deficits secondary to metabolic encephalopathy due to sepsis secondary to catheter peritonitis and hypoxemia              -patient may not shower             -ELOS/Goals: 16-18 days, PT/OT Sup, SLP min A -tentative discharge date 9-9            -Continue CIR therapies including PT, OT, and SLP. Interdisciplinary team conference today to discuss goals, barriers to discharge, and dc planning.    -Will review records from [facility] -- has worsened since approximately 8/22, possible 8/19, consistent with records from nursing at [facility] with current behavior.    -9/3 continue enclosure bed for safety   -reduced therapy to 15/7  -May need SNF as wife reports she cannot provide 24/7 care, Est discharge 9/13 currently   2.  Antithrombotics: -DVT/anticoagulation:  Pharmaceutical: Heparin             -antiplatelet therapy: aspirin and Plavix   3. Pain Management: Tylenol as needed             -Lidoderm patches to back  -Elavil 25 mg at bedtime for nerve pain and sleep 8/27  -8/29: Pain well controlled on Elavil; DC lyrica, PRN Tylenol/oxy 2.5-5 mg d/t CKD  8-30: Appears patient has been getting pain medications together overnight along with antipsychotics, will discuss with nursing  8-31: Patient having an difficulty endorsing needs and not utilizing PRNs, scheduled oxycodone 5 mg 3 times daily and has 2.5 mg prn 3 times daily available.   9-1: Not utilizing PRNs, minimal complaints of pain, will discontinue scheduled given lethargy today..  -muscle rub R shoulder   4. Mood/Behavior/Sleep: LCSW to evaluate and provide emotional support             -continue Seroquel 50 mg TID             -clonazepam 0.5 mg TID prn              -Geodon 10 mg IM q 12 hours prn             -antipsychotic agents: n/a  -8-27: Agitated by soft restraints in bed, transition to 4 side rails, Posey alarm, and TeleSitter for now.  Veil  bed on standby if needed, however feel we should avoid this if possible due to likelihood of agitating patient further   - 8/28: Placed in veil bed; wrist restraints for dialysis. Scheduled Geodon 20 mg twice daily; discontinue Seroquel and clonazepam due to no perceivable benefit. Add as needed Zyprexa 5 mg twice daily p.o. or IM due to recent cardiac arrhythmias and concern for QTc prolongation. As needed ramelteon for sleep. Stop gabapentin due to possible side effect of agitation  - 8/28: No improvement except temporarily with Geodon; add depakote 125 mg q8h for ongoing aggitation, mood stabilization. Increase elavil to 50 mg at bedtime.  Workup ongoing.    - 8/29: EEG negative, labs normal. MRI pending. Sleeping well and calm since starting depakote and increase in elavil. Will monitor tonight and wean Geodon in AM if appropriate.   8-30: MRI showing chronic microvascular changes, otherwise normal.  Increasingly calm and appropriate during the day, with ongoing agitation at nighttime.  Will increase nighttime Geodon to 40 mg; will discuss sleep log with nursing, as it has not been performed.  8-31 no sleep overnight per sleep log, again getting agitated around 9 PM and 5 AM.  Not responsive to as needed Zyprexa, responsive to Ativan.  Attempted to wean off scheduled a.m. Geodon today, but patient became extremely agitated and unable to redirect, calm down with readministration of medication.  Resume Geodon 20 mg every morning/40 mg every afternoon, increased Elavil to 75 mg nightly, adding trazodone 50 mg nightly as needed.  P.o./IV Ativan 0.5 mg every 6 hours for agitation.  9-1: slept well overnight with trazodone and increased Elavil, no PRNs used.  Unfortunately, oversedated today.  Discontinue scheduled pain  medications as above, start wean of Elavil to 50 mg x2 days -> 25 mg x2 days -> 10 mg x2 days, off due to no apparent benefit and no complaints of pain.  Reduce Geodon from 20 mg every morning/40 mg every evening to 20 mg twice daily.  Continue trazodone as needed for sleep, as this seems to be the difference for a restful night yesterday.  If he remains overly sedated, would dose reduce and DC Depakote next.  Keep Ativan as needed d/t dialysis not having other options available.  9/2-3--non agitated but still sleepy   -dc'ed depakote as I doubt it's doing much at this dose anyway   -continue geodon at 20mg  bid   -scheduled trazodone at HS with back up dose if needed    -sleep was intermittent last night   -requires enclosure bed for safety  9/4 continue enclosure bed for now, consider trying DC of this device early next week  -9/5 appears to be doing better overall this morning, continues to have intermittent agitation at times.  Continue enclosure bed 5. Neuropsych/cognition: This patient is not capable of making decisions on his own behalf   -recommend neuropsych consult   - 8/27: Ongoing aggitation; Consult hospitalist for further workup on potential contributors to encephalopathy. Pending UA, LA 2.1-> 1.4; otherwise negative so far -Delirium precautions, sleep log, Veiled bed for safety and to minimize restraints -Wife has expressed that she is not interested in palliative care at this time; hopefully with above measures patient can get dialysis in the next 48 hours to limit worsening uremic encephalopathy and medical decline    - 8/29: Aggitation much improved, able to get some dialysis last night and today. See above for medication changes. Calling wife tonight to discuss.   8-30: Still with moderate  cognitive deficits, however appears once patient is less agitated he is better able to concentrate and has better orientation.  MRI negative for acute findings, monitor.  9/3 agitation better.  More sedated than anything at this point. Confused as well.    -depakote stopped   -continue other meds as rx'ed  9/4 alert this AM, continue  current regimen 6. Skin/Wound Care: Routine skin care checks             -sacral skin off-loading/protection   7. Fluids/Electrolytes/Nutrition: Strict Is and Os and follow-up chemistries per nephrology             -renal diet/ 1200 cc FR  - Renvela 800 mg 3 times daily for hyperphosphatemia added per nephrology 8-27   8: Hypotension: monitor TID and prn             -continue midodrine as needed during dialysis  Normotensive, monitor  9-1: Consistently 90s over 60s, has been stable but given findings of microvascular changes on MRI, may be too low for patient to perfuse properly.  Will add midodrine 5 mg tid and monitor.  DC Coreg as heart rate has been low to normal; may resume at low-dose once blood pressure improved.     03/22/2023    5:40 AM 03/21/2023    7:46 PM 03/21/2023    2:47 PM  Vitals with BMI  Systolic 108 117 161  Diastolic 76 84 97  Pulse 72 76 75    9/2 continue with ccurrent regimen. Bp's hr remain low   -elavil wean should help  9/5 BP has been improved, continue current regimen and monitor   9: Hyperlipidemia: continue statin   10: CAD s/p CABG 2023: on DAPT and statin.  - troponin elevated but stable, monitor 8/27   11: DM-2: A1c = 6.3% (at home on Lantus 22 units at bedtime, Tradjenta 5 mg daily)        -monitor PO intake             -continue SSI (0-6 units) does not appear to be requiring coverage   - 9/5 well-controlled, continue current regimen and monitor   Recent Labs    03/21/23 1643 03/21/23 2014 03/22/23 0556  GLUCAP 124* 97 91     12: ESRD: HD on Tues, Thurs, Sat -nephrology made aware   - Unable to tolerate 8/27 d/t aggitation, stopped early 8/28;  - attempting again 8/29, stopped early again -Per nephrology, redirectable with gum -Have to switch restraints during dialysis sessions from veil bed to  regular bed, then resume veil bed order once at rehab   13: COPD/OSA; chronic home 2L O2 (Symbicort, Ventolin, prednisone 40 mg daily at home)             -continue symbicort  -Added home oxygen back 8-27   14: GERD: continue Protonix   15: Non-sustained VT/pAF: per cardiology, Dr. Algie Coffer: -continue carvedilol 6.25 mg BID; consider increasing to 12.5 mg BID before starting low dose amiodarone  -consider Eliquis and discontinue asa and Plavix in one week if no active GI bleeding and stable hemoglobin from 8/24 -hemoglobin has remained stable but very low around 7, no recurrent A-fib, monitor for now given agitation/fall 8-31 and consider initiating Eliquis when safe  -Continue mag-ox to avoid hypomagnesemia -8-27: Single instance of bradycardia this morning 40s, may have been missed read, monitor through today and do not further adjust beta-blocker medications  -9-3 hr controlled after holding coreg -9/4-5 HR stable, continue to  monitor  16: HFrEF: Daily weights. Monitor for signs of overload  - Weight  stable/decreased  Filed Weights   03/15/23 1811 03/16/23 0500 03/19/23 0500  Weight: 104.6 kg 105 kg 99.1 kg      17: Liver nodularity on CT scan: LFTs reported to be normal. Avoid hepatotoxic medications    -Abdominal ultrasound earlier this admission with minimal ascites; ammonia within normal limits  -8/31: Did discuss with nephrology whether p.o. lactulose would be beneficial despite normal ammonia, after reviewing records unsure that this would be beneficial    18: Epistaxis - resolved             -previously improved with flonase and saline spray   19: Morbid Obesity. Dietary counseling Body mass index is 39.01 kg/m.      LOS: 10 days A FACE TO FACE EVALUATION WAS PERFORMED  Fanny Dance 03/22/2023, 8:50 AM

## 2023-03-22 NOTE — Progress Notes (Signed)
Received patient in bed to unit.  Alert and oriented.  Informed consent signed and in chart.   TX duration:3.5 hrs  Patient tolerated well.  Transported back to the room  Alert, without acute distress.  Hand-off given to patient's nurse.   Access used: L Fistula Access issues: none  Total UF removed: Medication(s) given: none   03/22/23 1733  Vitals  Temp (!) 97.5 F (36.4 C)  Temp Source Oral  BP 100/64  MAP (mmHg) 76  Pulse Rate 74  ECG Heart Rate 68  Resp (!) 21  Oxygen Therapy  SpO2 96 %  O2 Device Room Air  During Treatment Monitoring  HD Safety Checks Performed Yes  Intra-Hemodialysis Comments Tx completed;Tolerated well  Dialysis Fluid Bolus Normal Saline  Bolus Amount (mL) 300 mL  Fistula / Graft Left Other (Comment) Arteriovenous fistula  Placement Date/Time: 08/09/16 1327   Placed prior to admission: No  Orientation: Left  Access Location: (c) Other (Comment)  Access Type: Arteriovenous fistula  Site Condition No complications  Status Deaccessed  Drainage Description None     Stacie Glaze LPN [facility]

## 2023-03-23 ENCOUNTER — Encounter (HOSPITAL_COMMUNITY): Payer: Medicare Other

## 2023-03-23 DIAGNOSIS — I120 Hypertensive chronic kidney disease with stage 5 chronic kidney disease or end stage renal disease: Secondary | ICD-10-CM | POA: Diagnosis not present

## 2023-03-23 LAB — GLUCOSE, CAPILLARY
Glucose-Capillary: 101 mg/dL — ABNORMAL HIGH (ref 70–99)
Glucose-Capillary: 148 mg/dL — ABNORMAL HIGH (ref 70–99)
Glucose-Capillary: 134 mg/dL — ABNORMAL HIGH (ref 70–99)
Glucose-Capillary: 105 mg/dL — ABNORMAL HIGH (ref 70–99)

## 2023-03-23 NOTE — Progress Notes (Signed)
PROGRESS NOTE   Subjective/Complaints:  No acute events noted overnight.  Patient reports some soreness around his abdomen.  Nursing noted he has been hesitant to take his medications.  ROS: Limited by cognition.  Denies chest pain or shortness of breath, nausea/vomiting  Objective:   No results found. Recent Labs    03/22/23 1235  WBC 5.0  HGB 8.8*  HCT 29.7*  PLT 192    Recent Labs    03/22/23 1235  NA 135  K 4.6  CL 96*  CO2 28  GLUCOSE 139*  BUN 43*  CREATININE 9.84*  CALCIUM 9.0     Intake/Output Summary (Last 24 hours) at 03/23/2023 0840 Last data filed at 03/22/2023 1741 Gross per 24 hour  Intake --  Output 2000 ml  Net -2000 ml         Physical Exam: Vital Signs Blood pressure 106/64, pulse 73, temperature 98.3 F (36.8 C), temperature source Oral, resp. rate 16, height 5\' 4"  (1.626 m), weight 100.3 kg, SpO2 92%.  Constitutional: No distress . Vital signs reviewed.  Working with therapy in the gym. HEENT: NCAT, conjugate gaze, oral membranes moist Neck: supple Cardiovascular: RRR without murmur. No JVD    Respiratory/Chest: CTA Bilaterally without wheezes or rales. Normal effort    GI/Abdomen: BS +, non-tender, non-l distended, former PD site  Patient has superficial area of slight tenderness around his mid abdomen to light palpation-I think this is at the site of prior heparin injection Ext: no clubbing, cyanosis, or edema Psych: Awake and alert, not agitated this morning  Neurologic Exam:   Oriented to self, hospital + Memory deficit - ongoing + Poor insight + Mild L ptosis   Sensory exam: Intact to touch in all 4 extremities Motor exam: Antigravity and against resistance in all 4 extremities Coordination: Slight bilateral upper extremity tremor, intention, mild.    Musculoskeletal: No joint swelling noted No abnormal tone  Minimal right shoulder, trapezius  tenderness.   Assessment/Plan: 1. Functional deficits which require 15 hr per week of interdisciplinary therapy in a comprehensive inpatient rehab setting. Physiatrist is providing close team supervision and 24 hour management of active medical problems listed below. Physiatrist and rehab team continue to assess barriers to discharge/monitor patient progress toward functional and medical goals  Care Tool:  Bathing    Body parts bathed by patient: Right arm, Left arm, Chest, Abdomen, Front perineal area, Right upper leg, Buttocks, Left upper leg, Right lower leg, Left lower leg, Face         Bathing assist Assist Level: Maximal Assistance - Patient 24 - 49%     Upper Body Dressing/Undressing Upper body dressing        Upper body assist Assist Level: Moderate Assistance - Patient 50 - 74%    Lower Body Dressing/Undressing      Lower body dressing      What is the patient wearing?: Pants     Lower body assist Assist for lower body dressing: Maximal Assistance - Patient 25 - 49%     Toileting Toileting    Toileting assist Assist for toileting: Maximal Assistance - Patient 25 - 49%     Transfers Chair/bed  transfer  Transfers assist     Chair/bed transfer assist level: Minimal Assistance - Patient > 75%     Locomotion Ambulation   Ambulation assist      Assist level: Minimal Assistance - Patient > 75% Assistive device: Hand held assist Max distance: 35'   Walk 10 feet activity   Assist     Assist level: Minimal Assistance - Patient > 75% Assistive device: Hand held assist   Walk 50 feet activity   Assist Walk 50 feet with 2 turns activity did not occur: Safety/medical concerns         Walk 150 feet activity   Assist Walk 150 feet activity did not occur: Safety/medical concerns         Walk 10 feet on uneven surface  activity   Assist Walk 10 feet on uneven surfaces activity did not occur: Safety/medical concerns          Wheelchair     Assist Is the patient using a wheelchair?: Yes Type of Wheelchair: Manual    Wheelchair assist level: Dependent - Patient 0%      Wheelchair 50 feet with 2 turns activity    Assist        Assist Level: Dependent - Patient 0%   Wheelchair 150 feet activity     Assist      Assist Level: Dependent - Patient 0%   Blood pressure 106/64, pulse 73, temperature 98.3 F (36.8 C), temperature source Oral, resp. rate 16, height 5\' 4"  (1.626 m), weight 100.3 kg, SpO2 92%.  Medical Problem List and Plan: 1. Functional deficits secondary to metabolic encephalopathy due to sepsis secondary to catheter peritonitis and hypoxemia              -patient may not shower             -ELOS/Goals: 16-18 days, PT/OT Sup, SLP min A -tentative discharge date 9-9            -Continue CIR therapies including PT, OT, and SLP. Interdisciplinary team conference today to discuss goals, barriers to discharge, and dc planning.    -Will review records from [facility] -- has worsened since approximately 8/22, possible 8/19, consistent with records from nursing at [facility] with current behavior.    -9/3 continue enclosure bed for safety   -reduced therapy to 15/7  -May need SNF as wife reports she cannot provide 24/7 care, Est discharge 9/13 currently   2.  Antithrombotics: -DVT/anticoagulation:  Pharmaceutical: Heparin, 03/23/23 changed to Lovenox yesterday to help improved compliance             -antiplatelet therapy: aspirin and Plavix   3. Pain Management: Tylenol as needed             -Lidoderm patches to back  -Elavil 25 mg at bedtime for nerve pain and sleep 8/27  -8/29: Pain well controlled on Elavil; DC lyrica, PRN Tylenol/oxy 2.5-5 mg d/t CKD  8-30: Appears patient has been getting pain medications together overnight along with antipsychotics, will discuss with nursing  8-31: Patient having an difficulty endorsing needs and not utilizing PRNs, scheduled oxycodone 5 mg 3  times daily and has 2.5 mg prn 3 times daily available.   9-1: Not utilizing PRNs, minimal complaints of pain, will discontinue scheduled given lethargy today..  -muscle rub R shoulder   4. Mood/Behavior/Sleep: LCSW to evaluate and provide emotional support             -continue Seroquel 50 mg  TID             -clonazepam 0.5 mg TID prn             -Geodon 10 mg IM q 12 hours prn             -antipsychotic agents: n/a  -8-27: Agitated by soft restraints in bed, transition to 4 side rails, Posey alarm, and TeleSitter for now.  Veil bed on standby if needed, however feel we should avoid this if possible due to likelihood of agitating patient further   - 8/28: Placed in veil bed; wrist restraints for dialysis. Scheduled Geodon 20 mg twice daily; discontinue Seroquel and clonazepam due to no perceivable benefit. Add as needed Zyprexa 5 mg twice daily p.o. or IM due to recent cardiac arrhythmias and concern for QTc prolongation. As needed ramelteon for sleep. Stop gabapentin due to possible side effect of agitation  - 8/28: No improvement except temporarily with Geodon; add depakote 125 mg q8h for ongoing aggitation, mood stabilization. Increase elavil to 50 mg at bedtime.  Workup ongoing.    - 8/29: EEG negative, labs normal. MRI pending. Sleeping well and calm since starting depakote and increase in elavil. Will monitor tonight and wean Geodon in AM if appropriate.   8-30: MRI showing chronic microvascular changes, otherwise normal.  Increasingly calm and appropriate during the day, with ongoing agitation at nighttime.  Will increase nighttime Geodon to 40 mg; will discuss sleep log with nursing, as it has not been performed.  8-31 no sleep overnight per sleep log, again getting agitated around 9 PM and 5 AM.  Not responsive to as needed Zyprexa, responsive to Ativan.  Attempted to wean off scheduled a.m. Geodon today, but patient became extremely agitated and unable to redirect, calm down with  readministration of medication.  Resume Geodon 20 mg every morning/40 mg every afternoon, increased Elavil to 75 mg nightly, adding trazodone 50 mg nightly as needed.  P.o./IV Ativan 0.5 mg every 6 hours for agitation.  9-1: slept well overnight with trazodone and increased Elavil, no PRNs used.  Unfortunately, oversedated today.  Discontinue scheduled pain medications as above, start wean of Elavil to 50 mg x2 days -> 25 mg x2 days -> 10 mg x2 days, off due to no apparent benefit and no complaints of pain.  Reduce Geodon from 20 mg every morning/40 mg every evening to 20 mg twice daily.  Continue trazodone as needed for sleep, as this seems to be the difference for a restful night yesterday.  If he remains overly sedated, would dose reduce and DC Depakote next.  Keep Ativan as needed d/t dialysis not having other options available.  9/2-3--non agitated but still sleepy   -dc'ed depakote as I doubt it's doing much at this dose anyway   -continue geodon at 20mg  bid   -scheduled trazodone at HS with back up dose if needed    -sleep was intermittent last night   -requires enclosure bed for safety  9/4 continue enclosure bed for now, consider trying DC of this device early next week  -9/5 appears to be doing better overall this morning, continues to have intermittent agitation at times.  Continue enclosure bed  -9/6 agitation appears to be improving, patient is skeptical about medications and has been declining some of these.  Will discontinue multivitamin, RisaQuad to try to decrease pill burden 5. Neuropsych/cognition: This patient is not capable of making decisions on his own behalf   -recommend neuropsych consult   -  8/27: Ongoing aggitation; Consult hospitalist for further workup on potential contributors to encephalopathy. Pending UA, LA 2.1-> 1.4; otherwise negative so far -Delirium precautions, sleep log, Veiled bed for safety and to minimize restraints -Wife has expressed that she is not  interested in palliative care at this time; hopefully with above measures patient can get dialysis in the next 48 hours to limit worsening uremic encephalopathy and medical decline    - 8/29: Aggitation much improved, able to get some dialysis last night and today. See above for medication changes. Calling wife tonight to discuss.   8-30: Still with moderate cognitive deficits, however appears once patient is less agitated he is better able to concentrate and has better orientation.  MRI negative for acute findings, monitor.  9/3 agitation better. More sedated than anything at this point. Confused as well.    -depakote stopped   -continue other meds as rx'ed  9/4 alert this AM, continue  current regimen 6. Skin/Wound Care: Routine skin care checks             -sacral skin off-loading/protection   7. Fluids/Electrolytes/Nutrition: Strict Is and Os and follow-up chemistries per nephrology             -renal diet/ 1200 cc FR  - Renvela 800 mg 3 times daily for hyperphosphatemia added per nephrology 8-27   8: Hypotension: monitor TID and prn             -continue midodrine as needed during dialysis  Normotensive, monitor  9-1: Consistently 90s over 60s, has been stable but given findings of microvascular changes on MRI, may be too low for patient to perfuse properly.  Will add midodrine 5 mg tid and monitor.  DC Coreg as heart rate has been low to normal; may resume at low-dose once blood pressure improved.     03/23/2023    4:50 AM 03/22/2023    8:07 PM 03/22/2023    6:08 PM  Vitals with BMI  Weight   221 lbs 2 oz  BMI   37.94  Systolic 106 118   Diastolic 64 66   Pulse 73 71     9/2 continue with ccurrent regimen. Bp's hr remain low   -elavil wean should help  9/6 BP stable overall, continue midodrine   9: Hyperlipidemia: continue statin   10: CAD s/p CABG 2023: on DAPT and statin.  - troponin elevated but stable, monitor 8/27   11: DM-2: A1c = 6.3% (at home on Lantus 22 units at  bedtime, Tradjenta 5 mg daily)        -monitor PO intake             -continue SSI (0-6 units) does not appear to be requiring coverage   - 9/6 DC SSI, change CBGs to daily   Recent Labs    03/22/23 1148 03/22/23 2007 03/23/23 0638  GLUCAP 108* 118* 101*     12: ESRD: HD on Tues, Thurs, Sat -nephrology made aware   - Unable to tolerate 8/27 d/t aggitation, stopped early 8/28;  - attempting again 8/29, stopped early again -Per nephrology, redirectable with gum -Have to switch restraints during dialysis sessions from veil bed to regular bed, then resume veil bed order once at rehab   13: COPD/OSA; chronic home 2L O2 (Symbicort, Ventolin, prednisone 40 mg daily at home)             -continue symbicort  -Added home oxygen back 8-27   14: GERD: continue Protonix  15: Non-sustained VT/pAF: per cardiology, Dr. Algie Coffer: -continue carvedilol 6.25 mg BID; consider increasing to 12.5 mg BID before starting low dose amiodarone  -consider Eliquis and discontinue asa and Plavix in one week if no active GI bleeding and stable hemoglobin from 8/24 -hemoglobin has remained stable but very low around 7, no recurrent A-fib, monitor for now given agitation/fall 8-31 and consider initiating Eliquis when safe  -Continue mag-ox to avoid hypomagnesemia -8-27: Single instance of bradycardia this morning 40s, may have been missed read, monitor through today and do not further adjust beta-blocker medications  -9-3 hr controlled after holding coreg -9/4-5 HR stable, continue to monitor -9/6 consider Eliquis next week if agitation continues to improve  16: HFrEF: Daily weights. Monitor for signs of overload  - 9/6 weight stable continue to monitor  Filed Weights   03/19/23 0500 03/22/23 1323 03/22/23 1808  Weight: 99.1 kg 99.2 kg 100.3 kg      17: Liver nodularity on CT scan: LFTs reported to be normal. Avoid hepatotoxic medications    -Abdominal ultrasound earlier this admission with minimal  ascites; ammonia within normal limits  -8/31: Did discuss with nephrology whether p.o. lactulose would be beneficial despite normal ammonia, after reviewing records unsure that this would be beneficial    18: Epistaxis - resolved             -previously improved with flonase and saline spray   19: Morbid Obesity. Dietary counseling Body mass index is 39.01 kg/m.      LOS: 11 days A FACE TO FACE EVALUATION WAS PERFORMED  Fanny Dance 03/23/2023, 8:40 AM

## 2023-03-23 NOTE — Progress Notes (Signed)
Occupational Therapy Session Note  Patient Details  Name: [name] [name] MRN: [id number] Date of Birth: [birth year]  Today's Date: 03/23/2023 OT Individual Time: 1130-1200 OT Individual Time Calculation (min): 30 min    Short Term Goals: Week 1:  OT Short Term Goal 1 (Week 1): Pt will will complete 1 step of UB dressing task OT Short Term Goal 1 - Progress (Week 1): Met OT Short Term Goal 2 (Week 1): Patient will stand with min A in preparation for BADL task OT Short Term Goal 2 - Progress (Week 1): Met OT Short Term Goal 3 (Week 1): Pt will wash two body parts with min cues OT Short Term Goal 3 - Progress (Week 1): Met Week 2:  OT Short Term Goal 1 (Week 2): Patient will complete toilet transfer with min A of 1 OT Short Term Goal 2 (Week 2): Patient will complete LB dressing with min A OT Short Term Goal 3 (Week 2): Patient will complete UB dressing with min A Week 3:     Skilled Therapeutic Interventions/Progress Updates:    1:1 Pt received in the w/c. Attempted to encourage pt to shower and change clothes but he was adamant that he would at home and that the clothes in his drawer were someone else's. Pt still wearing clothes from yesterday and his shirt was inside out. Pt kept saying his room was not his (his is downstairs ) even though he knew he was on 4 W. Pt did engage on walking with HHA with min guard to the dayroom and rested in a regular chair. Pt did recognize another pt from his home town and she did him.  He reported liked being in the dayroom. Then agreed to wash back with HHA back to his room. On the way back he asked this therapist- "Is this place open tomorrow; I would like to come back tomorrow and walk around."  Pt lef tin his w/c next to the bed set up for lunch but pt did not agree to the safety alarm belt - RN and NT aware. Telesitter in place.   Therapy Documentation Precautions:  Precautions Precautions: Fall Restrictions Weight Bearing Restrictions:  No  Pain:   Reported his left HD site bothered him- offered emotional support   Therapy/Group: Individual Therapy  [name] [facility] 03/23/2023, 3:27 PM

## 2023-03-23 NOTE — Progress Notes (Signed)
Speech Language Pathology Note  Patient Details  Name: [name] [name] MRN: [id number] Date of Birth: [birth year]  Today's Date: 03/23/2023 SLP Individual Time: 0900-0957 SLP Individual Time Calculation (min): 57 min  Short Term Goals: Week 2: SLP Short Term Goal 1 (Week 2): STGs=LTGs 2* ELOS  Skilled Therapeutic Interventions: SLP conducted skilled therapy session targeting cognitive retraining goals. Patient handed off from physical therapy session, upright in w/c and pleasant. Patient wheeled himself back into room and NT promptly brought breakfast tray. During breakfast, patient agreeable to participation in cognitive therapy tasks including basic temporal problem solving, orientation questions, and calendar problem solving. During basic problem solving and orientation tasks, patient required maxA. Patient demonstrated frustration, stating that his "brain isn't working right." Patient benefited from breaking problems down into smaller pieces. Towards end of session, RN entered to give patient morning medications with patient agreeable. Patient was left in chair with call bell in reach and chair alarm set. SLP will continue to target goals per plan of care.      Pain Pain Assessment Pain Scale: 0-10 Pain Score: 3  Pain Location: Abdomen  Agitated Behavior Scale: Observation Details Observation Environment: CIR Start of observation period - Date: 03/23/23 Start of observation period - Time: 0900 End of observation period - Date: 03/23/23 End of observation period - Time: 1000 Agitated Behavior Scale (DO NOT LEAVE BLANKS) Short attention span, easy distractibility, inability to concentrate: Present to a slight degree Impulsive, impatient, low tolerance for pain or frustration: Absent Uncooperative, resistant to care, demanding: Absent Violent and/or threatening violence toward people or property: Absent Explosive and/or unpredictable anger: Absent Rocking, rubbing, moaning, or other  self-stimulating behavior: Absent Pulling at tubes, restraints, etc.: Absent Wandering from treatment areas: Absent Restlessness, pacing, excessive movement: Absent Repetitive behaviors, motor, and/or verbal: Absent Rapid, loud, or excessive talking: Absent Sudden changes of mood: Absent Easily initiated or excessive crying and/or laughter: Absent Self-abusiveness, physical and/or verbal: Absent Agitated behavior scale total score: 15  Therapy/Group: Individual Therapy  [name], M.A., CCC-SLP  [name] 03/23/2023, 11:10 AM

## 2023-03-23 NOTE — Progress Notes (Incomplete)
Patient ID: [name] [name], male   DOB: January 20, 1951, 72 y.o.   MRN: [id number]

## 2023-03-23 NOTE — Progress Notes (Signed)
[facility] Progress Note   Subjective:    Seen and examined patient at bedside. Patient sitting up in recliner. He denies any acute issues. Tolerated yesterday's HD with net UF 2L. Next HD 9/7.  Objective Vitals:   03/22/23 2007 03/22/23 2058 03/23/23 0450 03/23/23 0724  BP: 118/66  106/64   Pulse: 71  73   Resp: 16  16   Temp: 98.4 F (36.9 C)  98.3 F (36.8 C)   TempSrc:   Oral   SpO2: 97% 97% 92% 92%  Weight:      Height:       Physical Exam General: Alert male in NAD Lungs: Respirations unlabored Abdomen: non-distended Extremities: No edema b/l lower extremities Dialysis Access: AVF + t/b  Filed Weights   03/19/23 0500 03/22/23 1323 03/22/23 1808  Weight: 99.1 kg 99.2 kg 100.3 kg    Intake/Output Summary (Last 24 hours) at 03/23/2023 1434 Last data filed at 03/22/2023 1741 Gross per 24 hour  Intake --  Output 2000 ml  Net -2000 ml    Additional Objective Labs: Basic Metabolic Panel: Recent Labs  Lab 03/17/23 1450 03/22/23 1235  NA 134* 135  K 4.5 4.6  CL 90* 96*  CO2 24 28  GLUCOSE 115* 139*  BUN 49* 43*  CREATININE 10.46* 9.84*  CALCIUM 8.8* 9.0  PHOS 5.9* 4.3   Liver Function Tests: Recent Labs  Lab 03/17/23 1450 03/22/23 1235  ALBUMIN 1.7* 2.2*   No results for input(s): "LIPASE", "AMYLASE" in the last 168 hours. CBC: Recent Labs  Lab 03/17/23 1450 03/18/23 0711 03/22/23 1235  WBC 4.9 5.4 5.0  HGB 7.4* 7.2* 8.8*  HCT 25.1* 24.5* 29.7*  MCV 95.8 97.2 97.7  PLT 146* 142* 192   Blood Culture    Component Value Date/Time   SDES BLOOD RIGHT HAND 02/19/2023 1855   SPECREQUEST  02/19/2023 1855    BOTTLES DRAWN AEROBIC ONLY Blood Culture results may not be optimal due to an inadequate volume of blood received in culture bottles   CULT  02/19/2023 1855    NO GROWTH 5 DAYS Performed at [facility], [address], [place], [place] [address]    REPTSTATUS 02/24/2023 FINAL 02/19/2023 1855    Cardiac Enzymes: No  results for input(s): "CKTOTAL", "CKMB", "CKMBINDEX", "TROPONINI" in the last 168 hours. CBG: Recent Labs  Lab 03/22/23 0556 03/22/23 1148 03/22/23 2007 03/23/23 0638 03/23/23 1143  GLUCAP 91 108* 118* 101* 148*   Iron Studies: No results for input(s): "IRON", "TIBC", "TRANSFERRIN", "FERRITIN" in the last 72 hours. Lab Results  Component Value Date   INR 1.0 03/08/2023   INR 1.1 03/03/2023   INR 1.2 02/18/2023   Studies/Results: No results found.  Medications:   (feeding supplement) PROSource Plus  30 mL Oral BID BM   amitriptyline  10 mg Oral QHS   aspirin EC  81 mg Oral Daily   atorvastatin  80 mg Oral Daily   Chlorhexidine Gluconate Cloth  6 each Topical Q0600   Chlorhexidine Gluconate Cloth  6 each Topical Q0600   clopidogrel  75 mg Oral Daily   [START ON 03/25/2023] darbepoetin (ARANESP) injection - DIALYSIS  150 mcg Subcutaneous Q Sun-1800   divalproex  125 mg Oral Q8H   enoxaparin (LOVENOX) injection  30 mg Subcutaneous Q24H   feeding supplement  237 mL Oral BID WC   Gerhardt's butt cream  1 Application Topical QID   lidocaine  1 patch Transdermal Q2200   lidocaine  1 patch Transdermal Q24H   magnesium oxide  400 mg Oral Daily   midodrine  5 mg Oral TID WC   mometasone-formoterol  2 puff Inhalation BID   pantoprazole  40 mg Oral BID   sevelamer carbonate  800 mg Oral TID WC   traZODone  50 mg Oral QHS   ziprasidone  20 mg Oral BID WC    Dialysis Orders: Previously on [facility] thru [facility] - changed to HD - will need outpatient HD unit established prior to d/c.   Assessment/Plan: Debility: In CIR 2. Encephalopathy/agitation: Agitation worsened overnight requiring enclosure bed again. Unclear cause - present for past 1-2 weeks per wife (was happening at [facility] as well). Work-up ongoing, ammonia fine, brain MRI with no acute findings. Unable to dialyze 8/27 - was upset/yelling. We have frequently had to end HD early for his safety due to agitation.  Prn  Ativan ordered while in HD. 3. ESRD:  Transitioned from PD to HD recently on TTS schedule. Using old AVF which is still functional, s/p f'gram 02/28/23. Unable to dialyze 8/27, intermittently having to shorten HD, see above. Next HD 9/7, will see how he behaves. 4. Hypertension/volume: BP soft with some hypotension. No respiratory issues.  Working to establish dry weight. Reinforced fluid restrictions.  5. Anemia: Hgb 7.2, tsat 38% with ferritin 2175.  No iron.  Increased aranesp to for 9/1. Checking CBC today 6. Metabolic bone disease: CorrCa and phos in goal.  On Renvela 1 AC TID.Labs were not checked yesterday, ordered with HD tomorrow.  7.  Nutrition:  Alb very low, continue supps.  Renal diet w/fluid restrictions.  8.  Hx NSVT: On Coreg 10.  Recent PD related peritonitis (+TNC >2000, cx's neg) and related +fungemia/ enterococcus bacteremia - underwent PD cath removal, TDC placement and HD cath placement and line holiday. Completed IV unasyn, IV micafungin and cipro through 8/15. TDC not replaced cause he had functional AVF.   11. Dispo: Patient has been either agitated or somnolent on HD lately. He will need to be able to dialyze without use of restraints and without being so sedated that he is not awakening to voice, etc in order to be safe for outpatient dialysis.     [name], NP [facility] 03/23/2023,2:34 PM  LOS: 11 days

## 2023-03-23 NOTE — Progress Notes (Signed)
Physical Therapy Session Note  Patient Details  Name: [name] [name] MRN: [id number] Date of Birth: [birth year]  Today's Date: 03/23/2023 PT Individual Time: 0805-0900 PT Individual Time Calculation (min): 55 min   Short Term Goals: Week 2:  PT Short Term Goal 1 (Week 2): STGs = LTGs  Skilled Therapeutic Interventions/Progress Updates:    Pt presents in room in enclosure bed, motivated to participate with PT. Pt reports feeling pain in stomach, agreeable to ginger ale but states it doesn't help. Session focused on gait training and participation with therapies.  Pt completes bed mobility supervision, sit<>stand CGA, stand pivot with CGA. Pt self propels WC supervision/min assist for navigating obstacles, ~30'. Pt transported to therapy gym and Pt ambulates 40' with RW. Pt requires extended seated rest break. Pt agitated about not receiving breakfast yet, therapist communicates with NT who reports pt's wife has ordered breakfast for him and it should be coming up. Therapist provides graham crackers, peanut butter, and ginger ale to console pt and improve participation with therapies. Following rest break pt ambulates 136' with R HHA min assist for postural sway, pt requests to take seated rest break prior to returning to [facility], finds office chair in hallway and provided with max verbal cues for sitting to unstable surface with min assist. After seated rest break pt able to stand from unstable surface with min assist and verbal cues, ambulates 30' back to [facility] and turns completely to sit in [facility] safely with mod verbal cues and CGA. Pt completes final gait trial 54' with R HHA, improved gait speed and balance noted, CGA/min assist and improved return to sit with min verbal cues for turning completely prior to sitting.  Pt returns to room and remains seated in WC, handoff to SLP, chair alarm donned and activated at end of session. Pt NT notifed about breakfast.   Therapy Documentation Precautions:   Precautions Precautions: Fall Restrictions Weight Bearing Restrictions: No  Therapy/Group: Individual Therapy  Edwin Cap PT, DPT 03/23/2023, 9:03 AM

## 2023-03-24 DIAGNOSIS — I120 Hypertensive chronic kidney disease with stage 5 chronic kidney disease or end stage renal disease: Secondary | ICD-10-CM | POA: Diagnosis not present

## 2023-03-24 LAB — RENAL FUNCTION PANEL
Albumin: 2 g/dL — ABNORMAL LOW (ref 3.5–5.0)
Anion gap: 13 (ref 5–15)
BUN: 33 mg/dL — ABNORMAL HIGH (ref 8–23)
CO2: 24 mmol/L (ref 22–32)
Calcium: 8.6 mg/dL — ABNORMAL LOW (ref 8.9–10.3)
Chloride: 99 mmol/L (ref 98–111)
Creatinine, Ser: 8.85 mg/dL — ABNORMAL HIGH (ref 0.61–1.24)
GFR, Estimated: 6 mL/min — ABNORMAL LOW (ref >60)
Glucose, Bld: 118 mg/dL — ABNORMAL HIGH (ref 70–99)
Phosphorus: 3.1 mg/dL (ref 2.5–4.6)
Potassium: 4.2 mmol/L (ref 3.5–5.1)
Sodium: 136 mmol/L (ref 135–145)

## 2023-03-24 LAB — CBC
HCT: 28.1 % — ABNORMAL LOW (ref 39.0–52.0)
Hemoglobin: 8.1 g/dL — ABNORMAL LOW (ref 13.0–17.0)
MCH: 28 pg (ref 26.0–34.0)
MCHC: 28.8 g/dL — ABNORMAL LOW (ref 30.0–36.0)
MCV: 97.2 fL (ref 80.0–100.0)
Platelets: 186 10*3/uL (ref 150–400)
RBC: 2.89 MIL/uL — ABNORMAL LOW (ref 4.22–5.81)
RDW: 20.2 % — ABNORMAL HIGH (ref 11.5–15.5)
WBC: 4.3 10*3/uL (ref 4.0–10.5)
nRBC: 0 % (ref 0.0–0.2)

## 2023-03-24 LAB — GLUCOSE, CAPILLARY
Glucose-Capillary: 93 mg/dL (ref 70–99)
Glucose-Capillary: 119 mg/dL — ABNORMAL HIGH (ref 70–99)

## 2023-03-24 MED ORDER — LIDOCAINE HCL (PF) 1 % IJ SOLN: 5.0000 mL | INTRAMUSCULAR | Status: DC | PRN

## 2023-03-24 MED ORDER — HEPARIN SODIUM (PORCINE) 1000 UNIT/ML DIALYSIS
1000.0000 [IU] | INTRAMUSCULAR | Status: DC | PRN
  Filled 2023-03-24 (×2): qty 1

## 2023-03-24 MED ORDER — PENTAFLUOROPROP-TETRAFLUOROETH EX AERO: 1.0000 | INHALATION_SPRAY | CUTANEOUS | Status: DC | PRN

## 2023-03-24 MED ORDER — ALTEPLASE 2 MG IJ SOLR: 2.0000 mg | Freq: Once | INTRAMUSCULAR | Status: DC | PRN

## 2023-03-24 MED ORDER — LIDOCAINE-PRILOCAINE 2.5-2.5 % EX CREA: 1.0000 | TOPICAL_CREAM | CUTANEOUS | Status: DC | PRN

## 2023-03-24 NOTE — Progress Notes (Signed)
Speech Language Pathology Daily Session Note  Patient Details  Name: KRIDAY SEMPER MRN: [id number] Date of Birth: Sep 20, 1950  Today's Date: 03/24/2023 SLP Individual Time: 4098-1191 SLP Individual Time Calculation (min): 45 min  Short Term Goals: Week 2: SLP Short Term Goal 1 (Week 2): STGs=LTGs 2* ELOS  Skilled Therapeutic Interventions: Skilled therapy intervention focused on cognitive and dysphagia goals. SLP facilitated session by providing min-modA during orientation tasks. Patient able to orient to self/situation/place independently, however required min-modA to utilize calendar to orient to time. SLP addressed dysphagia goals during consumption of breakfast tray. Patient with no overt s/sx of aspiration during trials of thin/regular solids, however required modA to take small bites/slow rate, reporting 'taking small bites I will be chewing forever." SLP session concluded 15 minutes early due to transport arrival for dialysis. Patient left in bed with alarm set and call bell in reach. Continue POC.    Pain Denies Pain  Therapy/Group: Individual Therapy  Shantay Sonn M.A., CF-SLP 03/24/2023, 8:21 AM

## 2023-03-24 NOTE — Progress Notes (Signed)
PROGRESS NOTE   Subjective/Complaints:  No acute events overnight.  Patient had hemodialysis today.  He appeared a little agitated after his treatment.  ROS: Limited by cognition.   Objective:   No results found. Recent Labs    03/22/23 1235 03/24/23 0925  WBC 5.0 4.3  HGB 8.8* 8.1*  HCT 29.7* 28.1*  PLT 192 186    Recent Labs    03/22/23 1235 03/24/23 0925  NA 135 136  K 4.6 4.2  CL 96* 99  CO2 28 24  GLUCOSE 139* 118*  BUN 43* 33*  CREATININE 9.84* 8.85*  CALCIUM 9.0 8.6*     Intake/Output Summary (Last 24 hours) at 03/24/2023 1748 Last data filed at 03/24/2023 1151 Gross per 24 hour  Intake --  Output 1300 ml  Net -1300 ml         Physical Exam: Vital Signs Blood pressure 108/80, pulse 73, temperature 98.7 F (37.1 C), temperature source Oral, resp. rate 19, height 5\' 4"  (1.626 m), weight 97.4 kg, SpO2 97%.  Constitutional: No distress . Vital signs reviewed.  Sitting in his room in wheelchair HEENT: NCAT, conjugate gaze, oral membranes moist Neck: supple Cardiovascular: RRR without murmur. No JVD    Respiratory/Chest: CTA Bilaterally without wheezes or rales. Normal effort    GI/Abdomen: BS +, non-tender, non-l distended, former PD site  Patient has superficial area of slight tenderness around his mid abdomen to light palpation-appears over area of prior surgical incision-possibly area of scar tissue Ext: no clubbing, cyanosis, or edema Psych: Awake and alert, not agitated this morning  Neurologic Exam:   Became agitated when nursing attempted to put alarm on his chair Oriented to self, hospital + Memory deficit - ongoing + Poor insight + Mild L ptosis   Sensory exam: Intact to touch in all 4 extremities Motor exam: Antigravity and against resistance in all 4 extremities Coordination: Slight bilateral upper extremity tremor, intention, mild.    Musculoskeletal: No joint swelling  noted No abnormal tone  Minimal right shoulder, trapezius tenderness.   Assessment/Plan: 1. Functional deficits which require 15 hr per week of interdisciplinary therapy in a comprehensive inpatient rehab setting. Physiatrist is providing close team supervision and 24 hour management of active medical problems listed below. Physiatrist and rehab team continue to assess barriers to discharge/monitor patient progress toward functional and medical goals  Care Tool:  Bathing    Body parts bathed by patient: Right arm, Left arm, Chest, Abdomen, Front perineal area, Right upper leg, Buttocks, Left upper leg, Right lower leg, Left lower leg, Face         Bathing assist Assist Level: Maximal Assistance - Patient 24 - 49%     Upper Body Dressing/Undressing Upper body dressing        Upper body assist Assist Level: Moderate Assistance - Patient 50 - 74%    Lower Body Dressing/Undressing      Lower body dressing      What is the patient wearing?: Pants     Lower body assist Assist for lower body dressing: Maximal Assistance - Patient 25 - 49%     Toileting Toileting    Toileting assist Assist for toileting:  Maximal Assistance - Patient 25 - 49%     Transfers Chair/bed transfer  Transfers assist     Chair/bed transfer assist level: Minimal Assistance - Patient > 75%     Locomotion Ambulation   Ambulation assist      Assist level: Minimal Assistance - Patient > 75% Assistive device: Hand held assist Max distance: 35'   Walk 10 feet activity   Assist     Assist level: Minimal Assistance - Patient > 75% Assistive device: Hand held assist   Walk 50 feet activity   Assist Walk 50 feet with 2 turns activity did not occur: Safety/medical concerns         Walk 150 feet activity   Assist Walk 150 feet activity did not occur: Safety/medical concerns         Walk 10 feet on uneven surface  activity   Assist Walk 10 feet on uneven surfaces  activity did not occur: Safety/medical concerns         Wheelchair     Assist Is the patient using a wheelchair?: Yes Type of Wheelchair: Manual    Wheelchair assist level: Dependent - Patient 0%      Wheelchair 50 feet with 2 turns activity    Assist        Assist Level: Dependent - Patient 0%   Wheelchair 150 feet activity     Assist      Assist Level: Dependent - Patient 0%   Blood pressure 108/80, pulse 73, temperature 98.7 F (37.1 C), temperature source Oral, resp. rate 19, height 5\' 4"  (1.626 m), weight 97.4 kg, SpO2 97%.  Medical Problem List and Plan: 1. Functional deficits secondary to metabolic encephalopathy due to sepsis secondary to catheter peritonitis and hypoxemia              -patient may not shower             -ELOS/Goals: 16-18 days, PT/OT Sup, SLP min A -tentative discharge date 9-9            -Continue CIR therapies including PT, OT, and SLP. Interdisciplinary team conference today to discuss goals, barriers to discharge, and dc planning.    -Will review records from [facility] -- has worsened since approximately 8/22, possible 8/19, consistent with records from nursing at [facility] with current behavior.    -9/3 continue enclosure bed for safety   -reduced therapy to 15/7  -May need SNF as wife reports she cannot provide 24/7 care, Est discharge 9/13 currently   2.  Antithrombotics: -DVT/anticoagulation:  Pharmaceutical: Heparin, 03/23/23 changed to Lovenox yesterday to help improved compliance             -antiplatelet therapy: aspirin and Plavix   3. Pain Management: Tylenol as needed             -Lidoderm patches to back  -Elavil 25 mg at bedtime for nerve pain and sleep 8/27  -8/29: Pain well controlled on Elavil; DC lyrica, PRN Tylenol/oxy 2.5-5 mg d/t CKD  8-30: Appears patient has been getting pain medications together overnight along with antipsychotics, will discuss with nursing  8-31: Patient having an difficulty endorsing  needs and not utilizing PRNs, scheduled oxycodone 5 mg 3 times daily and has 2.5 mg prn 3 times daily available.   9-1: Not utilizing PRNs, minimal complaints of pain, will discontinue scheduled given lethargy today..  -muscle rub R shoulder   4. Mood/Behavior/Sleep: LCSW to evaluate and provide emotional support             -  continue Seroquel 50 mg TID             -clonazepam 0.5 mg TID prn             -Geodon 10 mg IM q 12 hours prn             -antipsychotic agents: n/a  -8-27: Agitated by soft restraints in bed, transition to 4 side rails, Posey alarm, and TeleSitter for now.  Veil bed on standby if needed, however feel we should avoid this if possible due to likelihood of agitating patient further   - 8/28: Placed in veil bed; wrist restraints for dialysis. Scheduled Geodon 20 mg twice daily; discontinue Seroquel and clonazepam due to no perceivable benefit. Add as needed Zyprexa 5 mg twice daily p.o. or IM due to recent cardiac arrhythmias and concern for QTc prolongation. As needed ramelteon for sleep. Stop gabapentin due to possible side effect of agitation  - 8/28: No improvement except temporarily with Geodon; add depakote 125 mg q8h for ongoing aggitation, mood stabilization. Increase elavil to 50 mg at bedtime.  Workup ongoing.    - 8/29: EEG negative, labs normal. MRI pending. Sleeping well and calm since starting depakote and increase in elavil. Will monitor tonight and wean Geodon in AM if appropriate.   8-30: MRI showing chronic microvascular changes, otherwise normal.  Increasingly calm and appropriate during the day, with ongoing agitation at nighttime.  Will increase nighttime Geodon to 40 mg; will discuss sleep log with nursing, as it has not been performed.  8-31 no sleep overnight per sleep log, again getting agitated around 9 PM and 5 AM.  Not responsive to as needed Zyprexa, responsive to Ativan.  Attempted to wean off scheduled a.m. Geodon today, but patient became extremely  agitated and unable to redirect, calm down with readministration of medication.  Resume Geodon 20 mg every morning/40 mg every afternoon, increased Elavil to 75 mg nightly, adding trazodone 50 mg nightly as needed.  P.o./IV Ativan 0.5 mg every 6 hours for agitation.  9-1: slept well overnight with trazodone and increased Elavil, no PRNs used.  Unfortunately, oversedated today.  Discontinue scheduled pain medications as above, start wean of Elavil to 50 mg x2 days -> 25 mg x2 days -> 10 mg x2 days, off due to no apparent benefit and no complaints of pain.  Reduce Geodon from 20 mg every morning/40 mg every evening to 20 mg twice daily.  Continue trazodone as needed for sleep, as this seems to be the difference for a restful night yesterday.  If he remains overly sedated, would dose reduce and DC Depakote next.  Keep Ativan as needed d/t dialysis not having other options available.  9/2-3--non agitated but still sleepy   -dc'ed depakote as I doubt it's doing much at this dose anyway   -continue geodon at 20mg  bid   -scheduled trazodone at HS with back up dose if needed    -sleep was intermittent last night   -requires enclosure bed for safety  9/4 continue enclosure bed for now, consider trying DC of this device early next week  -9/5 appears to be doing better overall this morning, continues to have intermittent agitation at times.  Continue enclosure bed  -9/6 agitation appears to be improving, patient is skeptical about medications and has been declining some of these.  Will discontinue multivitamin, RisaQuad to try to decrease pill burden  -9/7 continues to have intermittent episodes of agitation however overall improving, appears to be taking  most of his medications 5. Neuropsych/cognition: This patient is not capable of making decisions on his own behalf   -recommend neuropsych consult   - 8/27: Ongoing aggitation; Consult hospitalist for further workup on potential contributors to encephalopathy.  Pending UA, LA 2.1-> 1.4; otherwise negative so far -Delirium precautions, sleep log, Veiled bed for safety and to minimize restraints -Wife has expressed that she is not interested in palliative care at this time; hopefully with above measures patient can get dialysis in the next 48 hours to limit worsening uremic encephalopathy and medical decline    - 8/29: Aggitation much improved, able to get some dialysis last night and today. See above for medication changes. Calling wife tonight to discuss.   8-30: Still with moderate cognitive deficits, however appears once patient is less agitated he is better able to concentrate and has better orientation.  MRI negative for acute findings, monitor.  9/3 agitation better. More sedated than anything at this point. Confused as well.    -depakote stopped   -continue other meds as rx'ed  9/4 alert this AM, continue  current regimen 6. Skin/Wound Care: Routine skin care checks             -sacral skin off-loading/protection   7. Fluids/Electrolytes/Nutrition: Strict Is and Os and follow-up chemistries per nephrology             -renal diet/ 1200 cc FR  - Renvela 800 mg 3 times daily for hyperphosphatemia added per nephrology 8-27   8: Hypotension: monitor TID and prn             -continue midodrine as needed during dialysis  Normotensive, monitor  9-1: Consistently 90s over 60s, has been stable but given findings of microvascular changes on MRI, may be too low for patient to perfuse properly.  Will add midodrine 5 mg tid and monitor.  DC Coreg as heart rate has been low to normal; may resume at low-dose once blood pressure improved.     03/24/2023    3:09 PM 03/24/2023   11:51 AM 03/24/2023   11:49 AM  Vitals with BMI  Weight  214 lbs 12 oz   BMI  36.84   Systolic 108 99 111  Diastolic 80 71 63  Pulse 73 63 74    9/2 continue with ccurrent regimen. Bp's hr remain low   -elavil wean should help  9/7 continue midodrine to avoid hypotension   9:  Hyperlipidemia: continue statin   10: CAD s/p CABG 2023: on DAPT and statin.  - troponin elevated but stable, monitor 8/27   11: DM-2: A1c = 6.3% (at home on Lantus 22 units at bedtime, Tradjenta 5 mg daily)        -monitor PO intake             -continue SSI (0-6 units) does not appear to be requiring coverage   - 9/6 DC SSI, change CBGs to daily  9/CBGs continue to be well-controlled consider discontinuation,   Recent Labs    03/23/23 2018 03/24/23 0612 03/24/23 1549  GLUCAP 105* 93 119*     12: ESRD: HD on Tues, Thurs, Sat -nephrology made aware   - Unable to tolerate 8/27 d/t aggitation, stopped early 8/28;  - attempting again 8/29, stopped early again -Per nephrology, redirectable with gum -Have to switch restraints during dialysis sessions from veil bed to regular bed, then resume veil bed order once at rehab -9/7 reported to be calm and pleasant at dialysis today,  did not require restraints   13: COPD/OSA; chronic home 2L O2 (Symbicort, Ventolin, prednisone 40 mg daily at home)             -continue symbicort  -Added home oxygen back 8-27   14: GERD: continue Protonix   15: Non-sustained VT/pAF: per cardiology, Dr. Algie Coffer: -continue carvedilol 6.25 mg BID; consider increasing to 12.5 mg BID before starting low dose amiodarone  -consider Eliquis and discontinue asa and Plavix in one week if no active GI bleeding and stable hemoglobin from 8/24 -hemoglobin has remained stable but very low around 7, no recurrent A-fib, monitor for now given agitation/fall 8-31 and consider initiating Eliquis when safe  -Continue mag-ox to avoid hypomagnesemia -8-27: Single instance of bradycardia this morning 40s, may have been missed read, monitor through today and do not further adjust beta-blocker medications  -9-3 hr controlled after holding coreg -9/4-5 HR stable, continue to monitor -9/6 consider Eliquis next week if agitation continues to improve  16: HFrEF: Daily weights.  Monitor for signs of overload  - 9/7 continue daily weights, fluid level management per hemodialysis team  Filed Weights   03/22/23 1808 03/24/23 0848 03/24/23 1151  Weight: 100.3 kg 98.7 kg 97.4 kg      17: Liver nodularity on CT scan: LFTs reported to be normal. Avoid hepatotoxic medications    -Abdominal ultrasound earlier this admission with minimal ascites; ammonia within normal limits  -8/31: Did discuss with nephrology whether p.o. lactulose would be beneficial despite normal ammonia, after reviewing records unsure that this would be beneficial    18: Epistaxis - resolved             -previously improved with flonase and saline spray   19: Morbid Obesity. Dietary counseling Body mass index is 39.01 kg/m.      LOS: 12 days A FACE TO FACE EVALUATION WAS PERFORMED  Fanny Dance 03/24/2023, 5:48 PM

## 2023-03-24 NOTE — Progress Notes (Addendum)
[name] [name] Progress Note   Subjective:    Seen and examined patient on HD. He is calm, pleasant, and cooperative so heading in the right direction from a behavioral perspective. He is not on restraints. Tolerating UFG 2L. BP is 115/64. He denies SOB, CP, and N/V.  Objective Vitals:   03/24/23 0903 03/24/23 0930 03/24/23 0939 03/24/23 1000  BP: (!) 109/59 94/74 106/70 123/73  Pulse: 88 69  73  Resp: 18 (!) 27  (!) 22  Temp:      TempSrc:      SpO2: 92% 100%  96%  Weight:      Height:       Physical Exam General: Awake, alert, calm, cooperative, NAD, on RA Heart:S1 and S2; No MRGs Lungs: Breathing unlabored, CTA throughout Abdomen: Round, soft, non-tender Extremities:No LE edema Dialysis Access: AVF (+) B/T   Filed Weights   03/22/23 1323 03/22/23 1808 03/24/23 0848  Weight: 99.2 kg 100.3 kg 98.7 kg    Intake/Output Summary (Last 24 hours) at 03/24/2023 1026 Last data filed at 03/23/2023 1226 Gross per 24 hour  Intake 118 ml  Output --  Net 118 ml    Additional Objective Labs: Basic Metabolic Panel: Recent Labs  Lab 03/17/23 1450 03/22/23 1235  NA 134* 135  K 4.5 4.6  CL 90* 96*  CO2 24 28  GLUCOSE 115* 139*  BUN 49* 43*  CREATININE 10.46* 9.84*  CALCIUM 8.8* 9.0  PHOS 5.9* 4.3   Liver Function Tests: Recent Labs  Lab 03/17/23 1450 03/22/23 1235  ALBUMIN 1.7* 2.2*   No results for input(s): "LIPASE", "AMYLASE" in the last 168 hours. CBC: Recent Labs  Lab 03/17/23 1450 03/18/23 0711 03/22/23 1235  WBC 4.9 5.4 5.0  HGB 7.4* 7.2* 8.8*  HCT 25.1* 24.5* 29.7*  MCV 95.8 97.2 97.7  PLT 146* 142* 192   Blood Culture    Component Value Date/Time   SDES BLOOD RIGHT HAND 02/19/2023 1855   SPECREQUEST  02/19/2023 1855    BOTTLES DRAWN AEROBIC ONLY Blood Culture results may not be optimal due to an inadequate volume of blood received in culture bottles   CULT  02/19/2023 1855    NO GROWTH 5 DAYS Performed at [facility],  [address], [place], [place] [address]    REPTSTATUS 02/24/2023 FINAL 02/19/2023 1855    Cardiac Enzymes: No results for input(s): "CKTOTAL", "CKMB", "CKMBINDEX", "TROPONINI" in the last 168 hours. CBG: Recent Labs  Lab 03/23/23 0638 03/23/23 1143 03/23/23 1619 03/23/23 2018 03/24/23 0612  GLUCAP 101* 148* 134* 105* 93   Iron Studies: No results for input(s): "IRON", "TIBC", "TRANSFERRIN", "FERRITIN" in the last 72 hours. Lab Results  Component Value Date   INR 1.0 03/08/2023   INR 1.1 03/03/2023   INR 1.2 02/18/2023   Studies/Results: No results found.  Medications:   (feeding supplement) PROSource Plus  30 mL Oral BID BM   aspirin EC  81 mg Oral Daily   atorvastatin  80 mg Oral Daily   Chlorhexidine Gluconate Cloth  6 each Topical Q0600   Chlorhexidine Gluconate Cloth  6 each Topical Q0600   clopidogrel  75 mg Oral Daily   [START ON 03/25/2023] darbepoetin (ARANESP) injection - DIALYSIS  150 mcg Subcutaneous Q Sun-1800   divalproex  125 mg Oral Q8H   enoxaparin (LOVENOX) injection  30 mg Subcutaneous Q24H   feeding supplement  237 mL Oral BID WC   Gerhardt's butt cream  1 Application Topical QID  lidocaine  1 patch Transdermal Q2200   lidocaine  1 patch Transdermal Q24H   magnesium oxide  400 mg Oral Daily   midodrine  5 mg Oral TID WC   mometasone-formoterol  2 puff Inhalation BID   pantoprazole  40 mg Oral BID   sevelamer carbonate  800 mg Oral TID WC   traZODone  50 mg Oral QHS   ziprasidone  20 mg Oral BID WC    Dialysis Orders: Previously on [facility] thru [facility] - changed to HD - will need outpatient HD unit established prior to d/c.   Assessment/Plan: Debility - in CIR 2. Encephalopathy/agitation: Patient was having issues with agitation previously. Unclear of etiology. Ammonia fine and brain MRI with no acute findings. Currently, his behavior is much better. Seen on HD today and he's alert, calm, and pleasant. Not in restraints. I feel we are  heading in the right direction. 3. ESRD:  Transitioned from PD to HD recently on TTS schedule. Using old AVF which is still functional, s/p f'gram 02/28/23. On HD and tolerating treatment well today. See above. I am hopeful we can get him into an outpatient center soon if he behavior remains stable.  4. Hypertension/volume: BP soft with some hypotension. No respiratory issues.  Working to establish dry weight. Reinforced fluid restrictions. Tolerating UFG 2L with HD today. 5. Anemia: Tsat 38% with ferritin 2175.  No iron. Increased aranesp to for 9/1. Hgb now 8.8.  6. Metabolic bone disease: CorrCa and phos in goal.  On Renvela 1 AC TID.Labs were not checked yesterday, ordered with HD tomorrow.  7.  Nutrition:  Alb very low, continue supps.  Renal diet w/fluid restrictions.  8.  Hx NSVT: On Coreg 10.  Recent PD related peritonitis (+TNC >2000, cx's neg) and related +fungemia/ enterococcus bacteremia - underwent PD cath removal, TDC placement and HD cath placement and line holiday. Completed IV unasyn, IV micafungin and cipro through 8/15. TDC not replaced cause he had functional AVF.   11. Dispo: Patient's behavior is improving. Tolerating HD well today without the use of restraints. Hopeful we can get him to an outpatient center if his behavior remains stable. Will see how he does this weekend.    ADDENDUM 03/24/23 at 12:30pm - I was informed of patient screaming on the HD unit wanting to get off the machine. Patient s/o 1 hour early today. Will need to continue monitoring his behavior. Rebbeca Paul, NP  [name], NP [name] 03/24/2023,10:26 AM  LOS: 12 days

## 2023-03-24 NOTE — Progress Notes (Signed)
Received patient in bed.Alert and oriented x 2.   Access used. ;Left upper arm AVF that worked well.  Duration of treatment: 2.33  Fluid removed: 1,300 cc  Hemo comment: H e quit his treatment.

## 2023-03-24 NOTE — Progress Notes (Signed)
Occupational Therapy Note  Patient Details  Name: AIJALON WINGETT MRN: [id number] Date of Birth: 1950-10-22  Today's Date: 03/24/2023 OT Missed Time: 45 Minutes (Patient was in dialysis) Missed Time Reason: Other (comment) (Dialysis)     Lavona Mound 03/24/2023, 3:41 PM

## 2023-03-24 NOTE — Progress Notes (Signed)
Physical Therapy Session Note  Patient Details  Name: [name] [name] MRN: [id number] Date of Birth: 02-Jun-1951  Today's Date: 03/24/2023 PT Missed Time: 45 Minutes Missed Time Reason: Unavailable (Comment) (dialysis)  Patient was taken to dialysis this morning and is off the floor, unable to participate in therapy session. Therapist notified scheduler as pt was supposed to have dialysis in the afternoon today. Missed 45 minutes of skilled physical therapy.  Ginny Forth , PT, DPT, NCS, CSRS 03/24/2023, 7:57 AM

## 2023-03-25 DIAGNOSIS — I120 Hypertensive chronic kidney disease with stage 5 chronic kidney disease or end stage renal disease: Secondary | ICD-10-CM | POA: Diagnosis not present

## 2023-03-25 LAB — GLUCOSE, CAPILLARY
Glucose-Capillary: 97 mg/dL (ref 70–99)
Glucose-Capillary: 112 mg/dL — ABNORMAL HIGH (ref 70–99)
Glucose-Capillary: 110 mg/dL — ABNORMAL HIGH (ref 70–99)

## 2023-03-25 NOTE — Progress Notes (Addendum)
Speech Language Pathology Daily Session Note  Patient Details  Name: [name] [name] MRN: [id number] Date of Birth: 09-26-1950  Today's Date: 03/25/2023 SLP Individual Time: 4098-1191 SLP Individual Time Calculation (min): 70 min  Short Term Goals: Week 2: SLP Short Term Goal 1 (Week 2): STGs=LTGs 2* ELOS  Skilled Therapeutic Interventions:  Pt was seen for skilled ST targeting cognitive goals.  Upon arrival, pt was resting in recliner but awakened to voice and was agreeable to participating in treatment.  Pt was oriented to place and date with use of calendar but needed max assist to reorient to situation.  SLP facilitated the session with a novel board game to address memory and problem solving.  Pt planned and executed a problem solving strategy within task with no more than min cues needed for working memory of task rules and procedures.  Pt was pleasantly compliant throughout today's therapy session but became agitated when therapist attempted to close enclosure bed after pt requested to lay down until his next therapy appt.  Pt then asked to use the bathroom and was slightly verbally agitated with therapist's use of gait belt and distant supervision in bathroom while toileting but was easily redirected.  Pt again requested to lay down when returned from bathroom but refused to allow therapist to close the enclosure bed.  Pt was agreeable to sitting in recliner with legs elevated and chair pad alarm activated when offered an alternative choice fo safety measure.  Telesitter already in place.  Nursing at bedside upon therapist's departure. Continue per current plan of care.    Pain Pain Assessment Pain Scale: 0-10 Pain Score: 0-No pain  Therapy/Group: Individual Therapy  Hokulani Rogel, Melanee Spry 03/25/2023, 1:51 PM

## 2023-03-25 NOTE — Progress Notes (Signed)
Occupational Therapy Session Note  Patient Details  Name: LUKIS MASSAR MRN: [id number] Date of Birth: 01/22/1951  Today's Date: 03/25/2023 OT Individual Time: 7829-5621 & 1450-1535 OT Individual Time Calculation (min): 45 min & 45 min   Short Term Goals: Week 2:  OT Short Term Goal 1 (Week 2): Patient will complete toilet transfer with min A of 1 OT Short Term Goal 2 (Week 2): Patient will complete LB dressing with min A OT Short Term Goal 3 (Week 2): Patient will complete UB dressing with min A  Skilled Therapeutic Interventions/Progress Updates:      Therapy Documentation Precautions:  Precautions Precautions: Fall Restrictions Weight Bearing Restrictions: No Session 1 General: "I have been married for 53 years." Pt seated in W/C upon OT arrival, agreeable to OT. Pt sowing no agitation this session. Pleasant and cooperative during OT session.   Pain: no pain reported  Exercises: Pt completed the following exercise circuit in order to improve functional activity, strength and endurance to prepare for ADLs such as bathing. Pt completed the following exercises in seated/standing position with no noted LOB/SOB and 3x10 repetitions on each exercise. Pt with rest breaks after each trial of exercise: -bicep curls with 4# dowel rod -triceps extensions with 3# dumbbell  -forward punches with 3# dumbbell -sit to stand transfers 3x5 CGA sit to stand    Other Treatments: OT and pt discussed why pt is in hospital d/t slight confusion on why he was here. After discussion pt still slightly confused on diagnosis. Pt educated on positioning arm for pain when it happens d/t pt concern.   Pt seated in W/C at end of session with W/C alarm donned, call light within reach and 4Ps assessed.    Session 2 General: "My knees were worked today" Pt seated in recliner upon OT arrival eating food, agreeable to OT. Wife present for session  Pain:  Unrated pain/stiffness reported in bilateral  knees, activity, intermittent rest breaks, distractions provided for pain management, pt reports tolerable to proceed.   ADL: Eating: s/u for snacks on table top Functional mobility/transfers: Pt ambulated Min A with Rt hand held assist from room><therapy gym while using rail in hallway for increased support. Pt with no LOB/SOB. Min VC for safety to fully approach destination when going to sit.   Other Treatments: Pt participating in medication management activity in order to increase scanning, organizational skills, planning and cognitive processing skills. Pt instructed to place certain pills at specific times of day. Pt required Mod-Max VC for correct placement of pills and correction of errors. Pt completed 5 groups/colors of beads as "pills" with Mod VC for error correction once pt realized error.   Pt seated in recliner at end of session with W/C alarm donned, call light within reach and 4Ps assessed.    Therapy/Group: Individual Therapy  Velia Meyer, OTD, OTR/L 03/25/2023, 3:46 PM

## 2023-03-25 NOTE — Progress Notes (Signed)
Physical Therapy Session Note  Patient Details  Name: [name] [name] MRN: [id number] Date of Birth: Jul 09, 1951  Today's Date: 03/25/2023 PT Individual Time: 1117-1200 PT Individual Time Calculation (min): 43 min   Short Term Goals: Week 2:  PT Short Term Goal 1 (Week 2): STGs = LTGs  Skilled Therapeutic Interventions/Progress Updates: Pt presents sitting in recliner asleep, but does arouse quickly and agreeable to therapy.  Pt amb w/ R HHA x 100' w/ min A to dayroom.  Pt performed toe tapping to small cone, alternating feet w/ min A and occasional LOB requiring increased assist to regain.  Pt does require verbal cues for speed and weight shift w/ occasional manual A for weight shifting.  Pt negotiated through cone obstacle course w/ min to CGA and cues for maintaining BOS, not getting too close to the cone.  Pt performed same course tapping alternating feet on cone. Pt picked up cones w/ alternating hands w/ min to CGA.  Pt required seated rest breaks during session 2/2 fatigue.  Pt amb x 100' x 3 w/o AD and only occasional CGA, verbal cues.  Pt returned to room and remained sitting in recliner w/ seat alarm on and all needs in reach.     Therapy Documentation Precautions:  Precautions Precautions: Fall Restrictions Weight Bearing Restrictions: No General:   Vital Signs:   Pain:0/10 Pain Assessment Pain Scale: 0-10 Pain Score: 0-No pain   Therapy/Group: Individual Therapy  Lucio Edward 03/25/2023, 1:05 PM

## 2023-03-25 NOTE — Progress Notes (Addendum)
PROGRESS NOTE   Subjective/Complaints:  Patient working with therapy this morning.  No new concerns or complaints.  ROS: Limited by cognition.   Objective:   No results found. Recent Labs    03/24/23 0925  WBC 4.3  HGB 8.1*  HCT 28.1*  PLT 186    Recent Labs    03/24/23 0925  NA 136  K 4.2  CL 99  CO2 24  GLUCOSE 118*  BUN 33*  CREATININE 8.85*  CALCIUM 8.6*     Intake/Output Summary (Last 24 hours) at 03/25/2023 1707 Last data filed at 03/25/2023 1314 Gross per 24 hour  Intake 240 ml  Output --  Net 240 ml         Physical Exam: Vital Signs Blood pressure (!) 121/56, pulse 61, temperature 98.3 F (36.8 C), temperature source Oral, resp. rate 18, height 5\' 4"  (1.626 m), weight 97.4 kg, SpO2 100%.  Constitutional: No distress . Vital signs reviewed.  Ambulating with physical therapy in the gym. HEENT: NCAT, conjugate gaze, oral membranes moist Neck: supple Cardiovascular: RRR without murmur. No JVD    Respiratory/Chest: CTA Bilaterally without wheezes or rales. Normal effort    GI/Abdomen: BS +, non-tender, non-l distended Patient has superficial area of slight tenderness around his mid abdomen -improved Ext: no clubbing, cyanosis, or edema Psych: Awake and alert, not agitated this morning  Neurologic Exam:   Calm, not agitated this morning, impulsive at times.  Forgets to use call bell Oriented to self, hospital + Memory deficit - ongoing + Poor insight + Mild L ptosis   Sensory exam: Intact to touch in all 4 extremities Motor exam: Antigravity and against resistance in all 4 extremities Coordination: Slight bilateral upper extremity tremor, intention, mild.    Musculoskeletal: No joint swelling noted No abnormal tone  Minimal right shoulder, trapezius tenderness.   Assessment/Plan: 1. Functional deficits which require 15 hr per week of interdisciplinary therapy in a comprehensive  inpatient rehab setting. Physiatrist is providing close team supervision and 24 hour management of active medical problems listed below. Physiatrist and rehab team continue to assess barriers to discharge/monitor patient progress toward functional and medical goals  Care Tool:  Bathing    Body parts bathed by patient: Right arm, Left arm, Chest, Abdomen, Front perineal area, Right upper leg, Buttocks, Left upper leg, Right lower leg, Left lower leg, Face         Bathing assist Assist Level: Maximal Assistance - Patient 24 - 49%     Upper Body Dressing/Undressing Upper body dressing        Upper body assist Assist Level: Moderate Assistance - Patient 50 - 74%    Lower Body Dressing/Undressing      Lower body dressing      What is the patient wearing?: Pants     Lower body assist Assist for lower body dressing: Maximal Assistance - Patient 25 - 49%     Toileting Toileting    Toileting assist Assist for toileting: Maximal Assistance - Patient 25 - 49%     Transfers Chair/bed transfer  Transfers assist     Chair/bed transfer assist level: Minimal Assistance - Patient > 75%  Locomotion Ambulation   Ambulation assist      Assist level: Minimal Assistance - Patient > 75% Assistive device: No Device Max distance: 100   Walk 10 feet activity   Assist     Assist level: Minimal Assistance - Patient > 75% Assistive device: No Device   Walk 50 feet activity   Assist Walk 50 feet with 2 turns activity did not occur: Safety/medical concerns  Assist level: Minimal Assistance - Patient > 75% Assistive device: No Device    Walk 150 feet activity   Assist Walk 150 feet activity did not occur: Safety/medical concerns         Walk 10 feet on uneven surface  activity   Assist Walk 10 feet on uneven surfaces activity did not occur: Safety/medical concerns         Wheelchair     Assist Is the patient using a wheelchair?: Yes Type of  Wheelchair: Manual    Wheelchair assist level: Dependent - Patient 0%      Wheelchair 50 feet with 2 turns activity    Assist        Assist Level: Dependent - Patient 0%   Wheelchair 150 feet activity     Assist      Assist Level: Dependent - Patient 0%   Blood pressure (!) 121/56, pulse 61, temperature 98.3 F (36.8 C), temperature source Oral, resp. rate 18, height 5\' 4"  (1.626 m), weight 97.4 kg, SpO2 100%.  Medical Problem List and Plan: 1. Functional deficits secondary to metabolic encephalopathy due to sepsis secondary to catheter peritonitis and hypoxemia              -patient may not shower             -ELOS/Goals: 16-18 days, PT/OT Sup, SLP min A -tentative discharge date 9-9            -Continue CIR therapies including PT, OT, and SLP. Interdisciplinary team conference today to discuss goals, barriers to discharge, and dc planning.    -Will review records from [facility] -- has worsened since approximately 8/22, possible 8/19, consistent with records from nursing at [facility] with current behavior.    -9/3 continue enclosure bed for safety   -reduced therapy to 15/7  -May need SNF as wife reports she cannot provide 24/7 care, Est discharge 9/13 currently   2.  Antithrombotics: -DVT/anticoagulation:  Pharmaceutical: Heparin, 03/23/23 changed to Lovenox yesterday to help improved compliance             -antiplatelet therapy: aspirin and Plavix   3. Pain Management: Tylenol as needed             -Lidoderm patches to back  -Elavil 25 mg at bedtime for nerve pain and sleep 8/27  -8/29: Pain well controlled on Elavil; DC lyrica, PRN Tylenol/oxy 2.5-5 mg d/t CKD  8-30: Appears patient has been getting pain medications together overnight along with antipsychotics, will discuss with nursing  8-31: Patient having an difficulty endorsing needs and not utilizing PRNs, scheduled oxycodone 5 mg 3 times daily and has 2.5 mg prn 3 times daily available.   9-1: Not  utilizing PRNs, minimal complaints of pain, will discontinue scheduled given lethargy today..  -muscle rub R shoulder  9/8 denies pain this morning   4. Mood/Behavior/Sleep: LCSW to evaluate and provide emotional support             -continue Seroquel 50 mg TID             -  clonazepam 0.5 mg TID prn             -Geodon 10 mg IM q 12 hours prn             -antipsychotic agents: n/a  -8-27: Agitated by soft restraints in bed, transition to 4 side rails, Posey alarm, and TeleSitter for now.  Veil bed on standby if needed, however feel we should avoid this if possible due to likelihood of agitating patient further   - 8/28: Placed in veil bed; wrist restraints for dialysis. Scheduled Geodon 20 mg twice daily; discontinue Seroquel and clonazepam due to no perceivable benefit. Add as needed Zyprexa 5 mg twice daily p.o. or IM due to recent cardiac arrhythmias and concern for QTc prolongation. As needed ramelteon for sleep. Stop gabapentin due to possible side effect of agitation  - 8/28: No improvement except temporarily with Geodon; add depakote 125 mg q8h for ongoing aggitation, mood stabilization. Increase elavil to 50 mg at bedtime.  Workup ongoing.    - 8/29: EEG negative, labs normal. MRI pending. Sleeping well and calm since starting depakote and increase in elavil. Will monitor tonight and wean Geodon in AM if appropriate.   8-30: MRI showing chronic microvascular changes, otherwise normal.  Increasingly calm and appropriate during the day, with ongoing agitation at nighttime.  Will increase nighttime Geodon to 40 mg; will discuss sleep log with nursing, as it has not been performed.  8-31 no sleep overnight per sleep log, again getting agitated around 9 PM and 5 AM.  Not responsive to as needed Zyprexa, responsive to Ativan.  Attempted to wean off scheduled a.m. Geodon today, but patient became extremely agitated and unable to redirect, calm down with readministration of medication.  Resume Geodon  20 mg every morning/40 mg every afternoon, increased Elavil to 75 mg nightly, adding trazodone 50 mg nightly as needed.  P.o./IV Ativan 0.5 mg every 6 hours for agitation.  9-1: slept well overnight with trazodone and increased Elavil, no PRNs used.  Unfortunately, oversedated today.  Discontinue scheduled pain medications as above, start wean of Elavil to 50 mg x2 days -> 25 mg x2 days -> 10 mg x2 days, off due to no apparent benefit and no complaints of pain.  Reduce Geodon from 20 mg every morning/40 mg every evening to 20 mg twice daily.  Continue trazodone as needed for sleep, as this seems to be the difference for a restful night yesterday.  If he remains overly sedated, would dose reduce and DC Depakote next.  Keep Ativan as needed d/t dialysis not having other options available.  9/2-3--non agitated but still sleepy   -dc'ed depakote as I doubt it's doing much at this dose anyway   -continue geodon at 20mg  bid   -scheduled trazodone at HS with back up dose if needed    -sleep was intermittent last night   -requires enclosure bed for safety  9/4 continue enclosure bed for now, consider trying DC of this device early next week  -9/5 appears to be doing better overall this morning, continues to have intermittent agitation at times.  Continue enclosure bed  -9/6 agitation appears to be improving, patient is skeptical about medications and has been declining some of these.  Will discontinue multivitamin, RisaQuad to try to decrease pill burden  -9/7 continues to have intermittent episodes of agitation however overall improving, appears to be taking most of his medications  9/8 agitation and mental status appears to be improving, consider discontinuation of  enclosure bed tomorrow 5. Neuropsych/cognition: This patient is not capable of making decisions on his own behalf   -recommend neuropsych consult   - 8/27: Ongoing aggitation; Consult hospitalist for further workup on potential contributors to  encephalopathy. Pending UA, LA 2.1-> 1.4; otherwise negative so far -Delirium precautions, sleep log, Veiled bed for safety and to minimize restraints -Wife has expressed that she is not interested in palliative care at this time; hopefully with above measures patient can get dialysis in the next 48 hours to limit worsening uremic encephalopathy and medical decline    - 8/29: Aggitation much improved, able to get some dialysis last night and today. See above for medication changes. Calling wife tonight to discuss.   8-30: Still with moderate cognitive deficits, however appears once patient is less agitated he is better able to concentrate and has better orientation.  MRI negative for acute findings, monitor.  9/3 agitation better. More sedated than anything at this point. Confused as well.    -depakote stopped   -continue other meds as rx'ed  9/4 alert this AM, continue  current regimen 6. Skin/Wound Care: Routine skin care checks             -sacral skin off-loading/protection   7. Fluids/Electrolytes/Nutrition: Strict Is and Os and follow-up chemistries per nephrology             -renal diet/ 1200 cc FR  - Renvela 800 mg 3 times daily for hyperphosphatemia added per nephrology 8-27   8: Hypotension: monitor TID and prn             -continue midodrine as needed during dialysis  Normotensive, monitor  9-1: Consistently 90s over 60s, has been stable but given findings of microvascular changes on MRI, may be too low for patient to perfuse properly.  Will add midodrine 5 mg tid and monitor.  DC Coreg as heart rate has been low to normal; may resume at low-dose once blood pressure improved.     03/25/2023    4:07 PM 03/25/2023    6:54 AM 03/24/2023    9:35 PM  Vitals with BMI  Systolic 121 129 161  Diastolic 56 78 64  Pulse 61 62 79    9/2 continue with ccurrent regimen. Bp's hr remain low   -elavil wean should help  9/8 BP stable, continue midodrine and monitor   9: Hyperlipidemia: continue  statin   10: CAD s/p CABG 2023: on DAPT and statin.  - troponin elevated but stable, monitor 8/27   11: DM-2: A1c = 6.3% (at home on Lantus 22 units at bedtime, Tradjenta 5 mg daily)        -monitor PO intake             -continue SSI (0-6 units) does not appear to be requiring coverage   - 9/6 DC SSI, change CBGs to daily  9/8 discontinue CBGs   Recent Labs    03/24/23 1549 03/25/23 0654 03/25/23 1147  GLUCAP 119* 97 112*     12: ESRD: HD on Tues, Thurs, Sat -nephrology made aware   - Unable to tolerate 8/27 d/t aggitation, stopped early 8/28;  - attempting again 8/29, stopped early again -Per nephrology, redirectable with gum -Have to switch restraints during dialysis sessions from veil bed to regular bed, then resume veil bed order once at rehab -9/7 reported to be calm and pleasant at dialysis today, did not require restraints -9/8 behavior reported to be improving in dialysis note today  13: COPD/OSA; chronic home 2L O2 (Symbicort, Ventolin, prednisone 40 mg daily at home)             -continue symbicort  -Added home oxygen back 8-27   14: GERD: continue Protonix   15: Non-sustained VT/pAF: per cardiology, Dr. Algie Coffer: -continue carvedilol 6.25 mg BID; consider increasing to 12.5 mg BID before starting low dose amiodarone  -consider Eliquis and discontinue asa and Plavix in one week if no active GI bleeding and stable hemoglobin from 8/24 -hemoglobin has remained stable but very low around 7, no recurrent A-fib, monitor for now given agitation/fall 8-31 and consider initiating Eliquis when safe  -Continue mag-ox to avoid hypomagnesemia -8-27: Single instance of bradycardia this morning 40s, may have been missed read, monitor through today and do not further adjust beta-blocker medications  -9-3 hr controlled after holding coreg -9/4-5 HR stable, continue to monitor -9/6 consider Eliquis next week if agitation continues to improve  16: HFrEF: Daily weights. Monitor for  signs of overload  - 9/7 continue daily weights, fluid level management per hemodialysis team  Filed Weights   03/22/23 1808 03/24/23 0848 03/24/23 1151  Weight: 100.3 kg 98.7 kg 97.4 kg      17: Liver nodularity on CT scan: LFTs reported to be normal. Avoid hepatotoxic medications    -Abdominal ultrasound earlier this admission with minimal ascites; ammonia within normal limits  -8/31: Did discuss with nephrology whether p.o. lactulose would be beneficial despite normal ammonia, after reviewing records unsure that this would be beneficial    18: Epistaxis - resolved             -previously improved with flonase and saline spray   19: Morbid Obesity. Dietary counseling Body mass index is 39.01 kg/m.      LOS: 13 days A FACE TO FACE EVALUATION WAS PERFORMED  Fanny Dance 03/25/2023, 5:07 PM

## 2023-03-25 NOTE — Progress Notes (Signed)
[facility] Progress Note   Subjective:    Seen and examined patient at bedside. Seen sitting in recliner w/o restraints. He's calm and pleasant. Noted he became upset near end of treatment and signed off early but I believe it was a miss communication on how much time he had left on the machine. His behavior still appears to be progressing in the right direction though. Next HD 9/10.  Objective Vitals:   03/24/23 1151 03/24/23 1509 03/24/23 2135 03/25/23 0654  BP: 99/71 108/80 116/64 129/78  Pulse: 63 73 79 62  Resp: (!) 24 19 16 20   Temp: 98.7 F (37.1 C) 98.7 F (37.1 C) 97.7 F (36.5 C) 98.3 F (36.8 C)  TempSrc:  Oral  Oral  SpO2: 100% 97% 93% 96%  Weight: 97.4 kg     Height:       Physical Exam General: Awake, alert, calm, cooperative, NAD, on RA Heart:S1 and S2; No MRGs Lungs: Breathing unlabored, CTA throughout Abdomen: Round, soft, non-tender Extremities:No LE edema Dialysis Access: AVF (+) B/T   Filed Weights   03/22/23 1808 03/24/23 0848 03/24/23 1151  Weight: 100.3 kg 98.7 kg 97.4 kg    Intake/Output Summary (Last 24 hours) at 03/25/2023 1443 Last data filed at 03/25/2023 1314 Gross per 24 hour  Intake 240 ml  Output --  Net 240 ml    Additional Objective Labs: Basic Metabolic Panel: Recent Labs  Lab 03/22/23 1235 03/24/23 0925  NA 135 136  K 4.6 4.2  CL 96* 99  CO2 28 24  GLUCOSE 139* 118*  BUN 43* 33*  CREATININE 9.84* 8.85*  CALCIUM 9.0 8.6*  PHOS 4.3 3.1   Liver Function Tests: Recent Labs  Lab 03/22/23 1235 03/24/23 0925  ALBUMIN 2.2* 2.0*   No results for input(s): "LIPASE", "AMYLASE" in the last 168 hours. CBC: Recent Labs  Lab 03/22/23 1235 03/24/23 0925  WBC 5.0 4.3  HGB 8.8* 8.1*  HCT 29.7* 28.1*  MCV 97.7 97.2  PLT 192 186   Blood Culture    Component Value Date/Time   SDES BLOOD RIGHT HAND 02/19/2023 1855   SPECREQUEST  02/19/2023 1855    BOTTLES DRAWN AEROBIC ONLY Blood Culture results may not be  optimal due to an inadequate volume of blood received in culture bottles   CULT  02/19/2023 1855    NO GROWTH 5 DAYS Performed at [facility], [address], [place], [place] [address]    REPTSTATUS 02/24/2023 FINAL 02/19/2023 1855    Cardiac Enzymes: No results for input(s): "CKTOTAL", "CKMB", "CKMBINDEX", "TROPONINI" in the last 168 hours. CBG: Recent Labs  Lab 03/23/23 2018 03/24/23 0612 03/24/23 1549 03/25/23 0654 03/25/23 1147  GLUCAP 105* 93 119* 97 112*   Iron Studies: No results for input(s): "IRON", "TIBC", "TRANSFERRIN", "FERRITIN" in the last 72 hours. Lab Results  Component Value Date   INR 1.0 03/08/2023   INR 1.1 03/03/2023   INR 1.2 02/18/2023   Studies/Results: No results found.  Medications:   (feeding supplement) PROSource Plus  30 mL Oral BID BM   aspirin EC  81 mg Oral Daily   atorvastatin  80 mg Oral Daily   Chlorhexidine Gluconate Cloth  6 each Topical Q0600   Chlorhexidine Gluconate Cloth  6 each Topical Q0600   clopidogrel  75 mg Oral Daily   darbepoetin (ARANESP) injection - DIALYSIS  150 mcg Subcutaneous Q Sun-1800   divalproex  125 mg Oral Q8H   enoxaparin (LOVENOX) injection  30  mg Subcutaneous Q24H   feeding supplement  237 mL Oral BID WC   Gerhardt's butt cream  1 Application Topical QID   lidocaine  1 patch Transdermal Q2200   lidocaine  1 patch Transdermal Q24H   magnesium oxide  400 mg Oral Daily   midodrine  5 mg Oral TID WC   mometasone-formoterol  2 puff Inhalation BID   pantoprazole  40 mg Oral BID   sevelamer carbonate  800 mg Oral TID WC   traZODone  50 mg Oral QHS   ziprasidone  20 mg Oral BID WC    Dialysis Orders: Previously on [facility] thru [facility] - changed to HD - will need outpatient HD unit established prior to d/c.   Assessment/Plan: 1. Debility - in CIR 2. Encephalopathy/agitation: Patient was having issues with agitation previously. Unclear of etiology. Ammonia fine and brain MRI with no acute  findings. Currently, his behavior is much better. Remains alert, calm, and pleasant in his room today. Not in restraints. I was informed he was upset near the end of HD and signed off early, I believe this was more so due miss communication on how much time left he had on the machine. 3. ESRD:  Transitioned from PD to HD recently on TTS schedule. Using old AVF which is still functional, s/p f'gram 02/28/23. I am hopeful we can get him into an outpatient center soon if he behavior remains stable.  4. Hypertension/volume: BP soft with some hypotension. No respiratory issues.  Working to establish dry weight. Reinforced fluid restrictions. Tolerating UFG 2L with HD today. 5. Anemia: Tsat 38% with ferritin 2175.  No iron. Increased aranesp to for 9/1. Hgb now 8.1.  6. Metabolic bone disease: CorrCa and phos in goal.  On Renvela 1 AC TID.Labs were not checked yesterday, ordered with HD tomorrow.  7.  Nutrition:  Alb very low, continue supps.  Renal diet w/fluid restrictions.  8.  Hx NSVT: On Coreg 10.  Recent PD related peritonitis (+TNC >2000, cx's neg) and related +fungemia/ enterococcus bacteremia - underwent PD cath removal, TDC placement and HD cath placement and line holiday. Completed IV unasyn, IV micafungin and cipro through 8/15. TDC not replaced cause he had functional AVF.   11. Dispo: Patient's behavior is improving. Hopeful we can get him to an outpatient center if his behavior remains stable. Will see how he does this weekend.    [name], NP [facility] 03/25/2023,2:43 PM  LOS: 13 days

## 2023-03-26 ENCOUNTER — Encounter (HOSPITAL_COMMUNITY): Payer: Medicare Other

## 2023-03-26 DIAGNOSIS — N186 End stage renal disease: Secondary | ICD-10-CM | POA: Diagnosis not present

## 2023-03-26 DIAGNOSIS — D631 Anemia in chronic kidney disease: Secondary | ICD-10-CM | POA: Diagnosis not present

## 2023-03-26 DIAGNOSIS — N250 Renal osteodystrophy: Secondary | ICD-10-CM | POA: Diagnosis not present

## 2023-03-26 DIAGNOSIS — R5381 Other malaise: Secondary | ICD-10-CM | POA: Diagnosis not present

## 2023-03-26 DIAGNOSIS — Z992 Dependence on renal dialysis: Secondary | ICD-10-CM | POA: Diagnosis not present

## 2023-03-26 DIAGNOSIS — R4182 Altered mental status, unspecified: Secondary | ICD-10-CM | POA: Diagnosis not present

## 2023-03-26 DIAGNOSIS — I120 Hypertensive chronic kidney disease with stage 5 chronic kidney disease or end stage renal disease: Secondary | ICD-10-CM | POA: Diagnosis not present

## 2023-03-26 LAB — GLUCOSE, CAPILLARY
Glucose-Capillary: 98 mg/dL (ref 70–99)
Glucose-Capillary: 102 mg/dL — ABNORMAL HIGH (ref 70–99)
Glucose-Capillary: 113 mg/dL — ABNORMAL HIGH (ref 70–99)
Glucose-Capillary: 95 mg/dL (ref 70–99)

## 2023-03-26 MED ORDER — CHLORHEXIDINE GLUCONATE CLOTH 2 % EX PADS
6.0000 | MEDICATED_PAD | Freq: Every day | CUTANEOUS | Status: DC
  Administered 2023-03-28: 6 via TOPICAL

## 2023-03-26 NOTE — Progress Notes (Signed)
Physical Therapy Session Note  Patient Details  Name: [name] [name] MRN: [id number] Date of Birth: [birth year]  Today's Date: 03/26/2023 PT Individual Time: 1021-1101 PT Individual Time Calculation (min): 40 min   Short Term Goals: Week 2:  PT Short Term Goal 1 (Week 2): STGs = LTGs  Skilled Therapeutic Interventions/Progress Updates:    Pt presents behind nurse's station seated in [facility], pt agreeable to PT with education and encouragement. Pt reporting knee pain in bilateral knees, declines intervention. Pt with increased agitation initially today however participation improves throughout session. Session focused on gait training and education on energy conservation and safety for returning home. Pt completes all sit<>stands with CGA with significant improvement in safety awareness for turning completely prior to sitting with min verbal cues.  Pt transported to day room dependently for time management and energy conservation. Pt reporting not wanting to do much with therapy due to bilateral knee pain, pt rolls up pants to look at knees when therapist noticed excess bleeding from bandaid site, time taken to clean around wound and change bandage, RN notified.  Pt ambulates 64', 95', and 53' with R HHA, CGA/light min assist with pt provided with verbal cues for seated rest break while ambulating with education on energy conservation to promote improved safety with task. Pt with improved pace and cadence this session and able to verbalize when he was feeling off balance and corrects with minimal prompting. Pt does report increase in knee pain during ambulation but subsides slightly with seated rest breaks.  Pt returns to nurse's station via [facility] and remains seated in [facility] with all needs within reach, cal light in place and chair alarm donned and activated at end of session.   Therapy Documentation Precautions:  Precautions Precautions: Fall Restrictions Weight Bearing Restrictions:  No   Therapy/Group: Individual Therapy  Edwin Cap PT DPT 03/26/2023, 12:30 PM

## 2023-03-26 NOTE — Progress Notes (Signed)
PROGRESS NOTE   Subjective/Complaints:  Pt up at nurses station. No problems reported overnight  ROS: Limited due to cognitive/behavioral   Objective:   No results found. Recent Labs    03/24/23 0925  WBC 4.3  HGB 8.1*  HCT 28.1*  PLT 186    Recent Labs    03/24/23 0925  NA 136  K 4.2  CL 99  CO2 24  GLUCOSE 118*  BUN 33*  CREATININE 8.85*  CALCIUM 8.6*     Intake/Output Summary (Last 24 hours) at 03/26/2023 1130 Last data filed at 03/26/2023 0849 Gross per 24 hour  Intake 596 ml  Output --  Net 596 ml         Physical Exam: Vital Signs Blood pressure 131/88, pulse 64, temperature 97.8 F (36.6 C), temperature source Oral, resp. rate 18, height 5\' 4"  (1.626 m), weight 97.4 kg, SpO2 96%.  Constitutional: No distress . Vital signs reviewed. HEENT: NCAT, EOMI, oral membranes moist Neck: supple Cardiovascular: RRR without murmur. No JVD    Respiratory/Chest: CTA Bilaterally without wheezes or rales. Normal effort    GI/Abdomen: BS +, non-tender, non-distended Ext: no clubbing, cyanosis, or edema Psych: pleasant but confused  Neurologic Exam:   Alert.still impulsive. Poor awareness and insight Oriented to self, hospital + Memory deficit - ongoing + Poor insight + Mild L ptosis   Sensory exam: Intact to touch in all 4 extremities Motor exam: moves all 4 extremities without limitiations Coordination: Slight bilateral upper extremity tremor, intention, mild.    Musculoskeletal: No joint swelling noted No abnormal tone      Assessment/Plan: 1. Functional deficits which require 15 hr per week of interdisciplinary therapy in a comprehensive inpatient rehab setting. Physiatrist is providing close team supervision and 24 hour management of active medical problems listed below. Physiatrist and rehab team continue to assess barriers to discharge/monitor patient progress toward functional and medical  goals  Care Tool:  Bathing    Body parts bathed by patient: Right arm, Left arm, Chest, Abdomen, Front perineal area, Right upper leg, Buttocks, Left upper leg, Right lower leg, Left lower leg, Face         Bathing assist Assist Level: Maximal Assistance - Patient 24 - 49%     Upper Body Dressing/Undressing Upper body dressing        Upper body assist Assist Level: Moderate Assistance - Patient 50 - 74%    Lower Body Dressing/Undressing      Lower body dressing      What is the patient wearing?: Pants     Lower body assist Assist for lower body dressing: Maximal Assistance - Patient 25 - 49%     Toileting Toileting    Toileting assist Assist for toileting: Maximal Assistance - Patient 25 - 49%     Transfers Chair/bed transfer  Transfers assist     Chair/bed transfer assist level: Minimal Assistance - Patient > 75%     Locomotion Ambulation   Ambulation assist      Assist level: Minimal Assistance - Patient > 75% Assistive device: No Device Max distance: 100   Walk 10 feet activity   Assist     Assist  level: Minimal Assistance - Patient > 75% Assistive device: No Device   Walk 50 feet activity   Assist Walk 50 feet with 2 turns activity did not occur: Safety/medical concerns  Assist level: Minimal Assistance - Patient > 75% Assistive device: No Device    Walk 150 feet activity   Assist Walk 150 feet activity did not occur: Safety/medical concerns         Walk 10 feet on uneven surface  activity   Assist Walk 10 feet on uneven surfaces activity did not occur: Safety/medical concerns         Wheelchair     Assist Is the patient using a wheelchair?: Yes Type of Wheelchair: Manual    Wheelchair assist level: Dependent - Patient 0%      Wheelchair 50 feet with 2 turns activity    Assist        Assist Level: Dependent - Patient 0%   Wheelchair 150 feet activity     Assist      Assist Level:  Dependent - Patient 0%   Blood pressure 131/88, pulse 64, temperature 97.8 F (36.6 C), temperature source Oral, resp. rate 18, height 5\' 4"  (1.626 m), weight 97.4 kg, SpO2 96%.  Medical Problem List and Plan: 1. Functional deficits secondary to metabolic encephalopathy due to sepsis secondary to catheter peritonitis and hypoxemia              -patient may not shower             -ELOS/Goals: 16-18 days, PT/OT Sup, SLP min A -tentative discharge date 9-9           -Continue CIR therapies including PT, OT, and SLP   -Will review records from [facility] -- has worsened since approximately 8/22, possible 8/19, consistent with records from nursing at [facility] with current behavior.    - continue enclosure bed for safety  -  therapy 15/7  -May need SNF as wife reports she cannot provide 24/7 care, Est discharge 9/13 currently   2.  Antithrombotics: -DVT/anticoagulation:  Pharmaceutical: Heparin, 03/23/23 changed to Lovenox yesterday to help improved compliance             -antiplatelet therapy: aspirin and Plavix   3. Pain Management: Tylenol as needed             -Lidoderm patches to back  -Elavil 25 mg at bedtime for nerve pain and sleep 8/27  -8/29: Pain well controlled on Elavil; DC lyrica, PRN Tylenol/oxy 2.5-5 mg d/t CKD  8-30: Appears patient has been getting pain medications together overnight along with antipsychotics, will discuss with nursing  8-31: Patient having an difficulty endorsing needs and not utilizing PRNs, scheduled oxycodone 5 mg 3 times daily and has 2.5 mg prn 3 times daily available.   9-1: Not utilizing PRNs, minimal complaints of pain, will discontinue scheduled given lethargy today..  -muscle rub R shoulder  9/8 denies pain this morning   4. Mood/Behavior/Sleep: LCSW to evaluate and provide emotional support             -continue Seroquel 50 mg TID             -clonazepam 0.5 mg TID prn             -Geodon 10 mg IM q 12 hours prn             -antipsychotic  agents: n/a  -8-27: Agitated by soft restraints in bed, transition to 4 side rails,  Posey alarm, and TeleSitter for now.  Veil bed on standby if needed, however feel we should avoid this if possible due to likelihood of agitating patient further   - 8/28: Placed in veil bed; wrist restraints for dialysis. Scheduled Geodon 20 mg twice daily; discontinue Seroquel and clonazepam due to no perceivable benefit. Add as needed Zyprexa 5 mg twice daily p.o. or IM due to recent cardiac arrhythmias and concern for QTc prolongation. As needed ramelteon for sleep. Stop gabapentin due to possible side effect of agitation  - 8/28: No improvement except temporarily with Geodon; add depakote 125 mg q8h for ongoing aggitation, mood stabilization. Increase elavil to 50 mg at bedtime.  Workup ongoing.    - 8/29: EEG negative, labs normal. MRI pending. Sleeping well and calm since starting depakote and increase in elavil. Will monitor tonight and wean Geodon in AM if appropriate.   8-30: MRI showing chronic microvascular changes, otherwise normal.  Increasingly calm and appropriate during the day, with ongoing agitation at nighttime.  Will increase nighttime Geodon to 40 mg; will discuss sleep log with nursing, as it has not been performed.  8-31 no sleep overnight per sleep log, again getting agitated around 9 PM and 5 AM.  Not responsive to as needed Zyprexa, responsive to Ativan.  Attempted to wean off scheduled a.m. Geodon today, but patient became extremely agitated and unable to redirect, calm down with readministration of medication.  Resume Geodon 20 mg every morning/40 mg every afternoon, increased Elavil to 75 mg nightly, adding trazodone 50 mg nightly as needed.  P.o./IV Ativan 0.5 mg every 6 hours for agitation.  9-1: slept well overnight with trazodone and increased Elavil, no PRNs used.  Unfortunately, oversedated today.  Discontinue scheduled pain medications as above, start wean of Elavil to 50 mg x2 days -> 25  mg x2 days -> 10 mg x2 days, off due to no apparent benefit and no complaints of pain.  Reduce Geodon from 20 mg every morning/40 mg every evening to 20 mg twice daily.  Continue trazodone as needed for sleep, as this seems to be the difference for a restful night yesterday.  If he remains overly sedated, would dose reduce and DC Depakote next.  Keep Ativan as needed d/t dialysis not having other options available.  9/2-3--non agitated but still sleepy   -dc'ed depakote as I doubt it's doing much at this dose anyway   -continue geodon at 20mg  bid   -scheduled trazodone at HS with back up dose if needed    -sleep was intermittent last night   -requires enclosure bed for safety  9/4 continue enclosure bed for now, consider trying DC of this device early next week  -9/5 appears to be doing better overall this morning, continues to have intermittent agitation at times.  Continue enclosure bed  -9/6 agitation appears to be improving, patient is skeptical about medications and has been declining some of these.  Will discontinue multivitamin, RisaQuad to try to decrease pill burden  -9/7 continues to have intermittent episodes of agitation however overall improving, appears to be taking most of his medications  9/9 agitation and mental status appears to be improving -discussing discontinuation of enclosure bed with team 5. Neuropsych/cognition: This patient is not capable of making decisions on his own behalf   -recommend neuropsych consult   - 8/27: Ongoing aggitation; Consult hospitalist for further workup on potential contributors to encephalopathy. Pending UA, LA 2.1-> 1.4; otherwise negative so far -Delirium precautions, sleep log, Veiled  bed for safety and to minimize restraints -Wife has expressed that she is not interested in palliative care at this time; hopefully with above measures patient can get dialysis in the next 48 hours to limit worsening uremic encephalopathy and medical decline    -  8/29: Aggitation much improved, able to get some dialysis last night and today. See above for medication changes. Calling wife tonight to discuss.   8-30: Still with moderate cognitive deficits, however appears once patient is less agitated he is better able to concentrate and has better orientation.  MRI negative for acute findings, monitor.  9/3 agitation better. More sedated than anything at this point. Confused as well.    -depakote stopped   -continue other meds as rx'ed  9/9 seems to be fairly alert and calm today 6. Skin/Wound Care: Routine skin care checks             -sacral skin off-loading/protection   7. Fluids/Electrolytes/Nutrition: Strict Is and Os and follow-up chemistries per nephrology             -renal diet/ 1200 cc FR  - Renvela 800 mg 3 times daily for hyperphosphatemia added per nephrology 8-27   8: Hypotension: monitor TID and prn             -continue midodrine as needed during dialysis  Normotensive, monitor  9-1: Consistently 90s over 60s, has been stable but given findings of microvascular changes on MRI, may be too low for patient to perfuse properly.  Will add midodrine 5 mg tid and monitor.  DC Coreg as heart rate has been low to normal; may resume at low-dose once blood pressure improved.     03/26/2023    5:00 AM 03/25/2023    7:52 PM 03/25/2023    4:07 PM  Vitals with BMI  Systolic 131 145 161  Diastolic 88 68 56  Pulse 64 68 61    9/2 continue with ccurrent regimen. Bp's hr remain low   -elavil wean should help  9/9 BP stable, continue midodrine and monitor   9: Hyperlipidemia: continue statin   10: CAD s/p CABG 2023: on DAPT and statin.  - troponin elevated but stable, monitor 8/27   11: DM-2: A1c = 6.3% (at home on Lantus 22 units at bedtime, Tradjenta 5 mg daily)        -monitor PO intake             -continue SSI (0-6 units) does not appear to be requiring coverage   - 9/6 DC SSI, change CBGs to daily  9/8 discontinue CBGs   Recent Labs     03/25/23 1147 03/25/23 1728 03/26/23 0605  GLUCAP 112* 110* 98     12: ESRD: HD on Tues, Thurs, Sat -nephrology made aware   - Unable to tolerate 8/27 d/t aggitation, stopped early 8/28;  - attempting again 8/29, stopped early again -Per nephrology, redirectable with gum -Have to switch restraints during dialysis sessions from veil bed to regular bed, then resume veil bed order once at rehab -9/9 tolerating HD better   13: COPD/OSA; chronic home 2L O2 (Symbicort, Ventolin, prednisone 40 mg daily at home)             -continue symbicort  -Added home oxygen back 8-27   14: GERD: continue Protonix   15: Non-sustained VT/pAF: per cardiology, Dr. Algie Coffer: -continue carvedilol 6.25 mg BID; consider increasing to 12.5 mg BID before starting low dose amiodarone  -consider Eliquis and discontinue  asa and Plavix in one week if no active GI bleeding and stable hemoglobin from 8/24 -hemoglobin has remained stable but very low around 7, no recurrent A-fib, monitor for now given agitation/fall 8-31 and consider initiating Eliquis when safe  -Continue mag-ox to avoid hypomagnesemia -8-27: Single instance of bradycardia this morning 40s, may have been missed read, monitor through today and do not further adjust beta-blocker medications  -9-3 hr controlled after holding coreg -9/4-5 HR stable, continue to monitor -9/9 consider Eliquis this week if agitation continues to improve  16: HFrEF: Daily weights. Monitor for signs of overload  - 9/7 continue daily weights, fluid level management per hemodialysis team  Filed Weights   03/22/23 1808 03/24/23 0848 03/24/23 1151  Weight: 100.3 kg 98.7 kg 97.4 kg      17: Liver nodularity on CT scan: LFTs reported to be normal. Avoid hepatotoxic medications    -Abdominal ultrasound earlier this admission with minimal ascites; ammonia within normal limits  -8/31: Did discuss with nephrology whether p.o. lactulose would be beneficial despite normal ammonia,  after reviewing records unsure that this would be beneficial    18: Epistaxis - resolved             -previously improved with flonase and saline spray   19: Morbid Obesity. Dietary counseling Body mass index is 39.01 kg/m.      LOS: 14 days A FACE TO FACE EVALUATION WAS PERFORMED  Ranelle Oyster 03/26/2023, 11:30 AM

## 2023-03-26 NOTE — Progress Notes (Signed)
[facility] Progress Note   Subjective:   Pt in wheelchair at nurse's station, agrees to return to his room to talk to me. He is concerned about the whereabouts of his grandchildren but is fairly easy to redirect. Denies SOB, CP, dizziness, abdominal pain, N/V/D. Reports knees were a little swollen from walking a lot.   Objective Vitals:   03/25/23 0654 03/25/23 1607 03/25/23 1952 03/26/23 0500  BP: 129/78 (!) 121/56 (!) 145/68 131/88  Pulse: 62 61 68 64  Resp: 20 18 18 18   Temp: 98.3 F (36.8 C) 98.3 F (36.8 C) 98.1 F (36.7 C) 97.8 F (36.6 C)  TempSrc: Oral Oral Oral Oral  SpO2: 96% 100% 99% 96%  Weight:      Height:       Physical Exam General: Awake, alert, calm, cooperative, NAD, on RA Heart:S1 and S2; No MRGs Lungs: Breathing unlabored, CTA throughout Abdomen: Round, soft, non-tender Extremities: trace edema b/l lower extremities Dialysis Access: AVF (+) B/T     Additional Objective Labs: Basic Metabolic Panel: Recent Labs  Lab 03/22/23 1235 03/24/23 0925  NA 135 136  K 4.6 4.2  CL 96* 99  CO2 28 24  GLUCOSE 139* 118*  BUN 43* 33*  CREATININE 9.84* 8.85*  CALCIUM 9.0 8.6*  PHOS 4.3 3.1   Liver Function Tests: Recent Labs  Lab 03/22/23 1235 03/24/23 0925  ALBUMIN 2.2* 2.0*   No results for input(s): "LIPASE", "AMYLASE" in the last 168 hours. CBC: Recent Labs  Lab 03/22/23 1235 03/24/23 0925  WBC 5.0 4.3  HGB 8.8* 8.1*  HCT 29.7* 28.1*  MCV 97.7 97.2  PLT 192 186   Blood Culture    Component Value Date/Time   SDES BLOOD RIGHT HAND 02/19/2023 1855   SPECREQUEST  02/19/2023 1855    BOTTLES DRAWN AEROBIC ONLY Blood Culture results may not be optimal due to an inadequate volume of blood received in culture bottles   CULT  02/19/2023 1855    NO GROWTH 5 DAYS Performed at [facility], [address], [place], [place] [address]    REPTSTATUS 02/24/2023 FINAL 02/19/2023 1855    Cardiac Enzymes: No results for  input(s): "CKTOTAL", "CKMB", "CKMBINDEX", "TROPONINI" in the last 168 hours. CBG: Recent Labs  Lab 03/25/23 0654 03/25/23 1147 03/25/23 1728 03/26/23 0605 03/26/23 1134  GLUCAP 97 112* 110* 98 102*   Iron Studies: No results for input(s): "IRON", "TIBC", "TRANSFERRIN", "FERRITIN" in the last 72 hours. @lablastinr3 @ Studies/Results: No results found. Medications:   (feeding supplement) PROSource Plus  30 mL Oral BID BM   aspirin EC  81 mg Oral Daily   atorvastatin  80 mg Oral Daily   Chlorhexidine Gluconate Cloth  6 each Topical Q0600   Chlorhexidine Gluconate Cloth  6 each Topical Q0600   clopidogrel  75 mg Oral Daily   darbepoetin (ARANESP) injection - DIALYSIS  150 mcg Subcutaneous Q Sun-1800   divalproex  125 mg Oral Q8H   enoxaparin (LOVENOX) injection  30 mg Subcutaneous Q24H   feeding supplement  237 mL Oral BID WC   Gerhardt's butt cream  1 Application Topical QID   lidocaine  1 patch Transdermal Q2200   lidocaine  1 patch Transdermal Q24H   magnesium oxide  400 mg Oral Daily   midodrine  5 mg Oral TID WC   mometasone-formoterol  2 puff Inhalation BID   pantoprazole  40 mg Oral BID   sevelamer carbonate  800 mg Oral TID WC  traZODone  50 mg Oral QHS   ziprasidone  20 mg Oral BID WC    Outpatient Dialysis Orders: Previously on [facility] thru [facility] - changed to HD - will need outpatient HD unit established prior to d/c.   Assessment/Plan: 1. Debility - in CIR 2. Encephalopathy/agitation: Patient was having issues with agitation previously. Unclear of etiology. Ammonia fine and brain MRI with no acute findings. Currently, his behavior is much better. Remains alert, calm, and pleasant in his room today. Not in restraints. I was informed he was upset near the end of HD and signed off early, I believe this was more so due miss communication on how much time left he had on the machine. 3. ESRD:  Transitioned from PD to HD recently on TTS schedule. Using old AVF  which is still functional, s/p f'gram 02/28/23. I am hopeful we can get him into an outpatient center soon if he behavior remains stable.  4. Hypertension/volume: BP soft with some hypotension on HD. No respiratory issues.  Working to establish dry weight. Reinforced fluid restrictions. Tolerating UFG 2L with next HD.  5. Anemia: Tsat 38% with ferritin 2175.  No iron. Increased aranesp to for 9/1. Hgb now 8.1.  6. Metabolic bone disease: CorrCa and phos in goal.  On Renvela 1 AC TID. 7.  Nutrition:  Alb very low, continue supps.  Renal diet w/fluid restrictions.  8.  Hx NSVT: On Coreg 10.  Recent PD related peritonitis (+TNC >2000, cx's neg) and related +fungemia/ enterococcus bacteremia - underwent PD cath removal, TDC placement and HD cath placement and line holiday. Completed IV unasyn, IV micafungin and cipro through 8/15. TDC not replaced cause he had functional AVF.   11. Dispo: Patient's behavior is improving. He is still requiring a safety belt to prevent him from trying to stand/"look for his grand kids." Will need to be able to calmly sit through HD for a full outpatient treatment without trying to stand for his own safety. It does seem like we are moving in the right direction.   Rogers Blocker, PA-C 03/26/2023, 11:52 AM  [facility] Pager: [phone]

## 2023-03-26 NOTE — Progress Notes (Signed)
Occupational Therapy Weekly Progress Note  Patient Details  Name: [name] [name] MRN: [id number] Date of Birth: 15-Jun-1951  Beginning of progress report period: March 13, 2023 End of progress report period: March 26, 2023  Today's Date: 03/26/2023 OT Individual Time: 4098-1191 OT Individual Time Calculation (min): 59 min    Patient has met 2 of 3 short term goals.  Pt is making slow but steady progress towards OT goals. He has had some improvement in behaviors during the day and has been much more redirectable. He only wants to perform BADL tasks when his spouse is here and we coordinated a shower with her this week where pt did well with functional task> Continue with current POC.  Patient continues to demonstrate the following deficits: muscle weakness, decreased cardiorespiratoy endurance, and decreased attention, decreased awareness, decreased problem solving, decreased safety awareness, decreased memory, and delayed processing and therefore will continue to benefit from skilled OT intervention to enhance overall performance with BADL and Reduce care partner burden.  Patient progressing toward long term goals..  Continue plan of care.  OT Short Term Goals Week 2:  OT Short Term Goal 1 (Week 2): Patient will complete toilet transfer with min A of 1 OT Short Term Goal 1 - Progress (Week 2): Met OT Short Term Goal 2 (Week 2): Patient will complete LB dressing with min A OT Short Term Goal 2 - Progress (Week 2): Progressing toward goal OT Short Term Goal 3 (Week 2): Patient will complete UB dressing with min A OT Short Term Goal 3 - Progress (Week 2): Met Week 3:  OT Short Term Goal 1 (Week 3): LTG=STG 2/2 ELOS  Skilled Therapeutic Interventions/Progress Updates:    Pt greeted seated in wc at nurses station and agreeable to OT treatment session. OT offered a shower to pt but he refused stating "you don't think my wife cleans me good? She ain't gunna let her man walk around smelling  like [place]." Pt getting slightly agitated, but OT abe to redirect conversation. Addressed problem solving and visual-spatial skills using Peg board task with "x"shaped pattern. Pt needed moderate cues and demonstration to initiate and initiate correctly. Progressed to novel card game f "Crazy 8's." Pt needed moderate cues to understand the rules to the game progressing to min cues with practice. Overall pt did well looking for matching suits of cards. Pt returned to room and requested to return to bed to rest. Pt ambulated back to bed without device and min A. Pt sat EOB then became more agitated asking OT to find his unlce. Pt impulsively got back up to wc with min A. Pt left seated st nurses station and left with chair alarm on and needs met.   Therapy Documentation Precautions:  Precautions Precautions: Fall Restrictions Weight Bearing Restrictions: No  Pain:  Denies pain   Therapy/Group: Individual Therapy  [name] 03/26/2023, 10:04 AM

## 2023-03-26 NOTE — Progress Notes (Signed)
Speech Language Pathology Note  Patient Details  Name: [name] [name] MRN: [id number] Date of Birth: 09-Jun-1951  Today's Date: 03/26/2023 SLP Individual Time: 1445-1533 SLP Individual Time Calculation (min): 48 min  Short Term Goals: Week 2: SLP Short Term Goal 1 (Week 2): STGs=LTGs 2* ELOS  Skilled Therapeutic Interventions: SLP conducted skilled therapy session targeting cognitive retraining goals. Wife present at bedside and throughout session. Patient received in unzipped veil bed requested assistance with transfer to wheelchair. SLP assisted patient with patient exhibiting improvements to safety, judgement, and following directions throughout. SLP utilized external calendar aid throughout therapy session with patient requiring min-modA to orient to date. SLP guided patient through basic schedule/problem solving questions with patient benefiting from increased time, visual cues and maxA-totalA to solve. Patient exhibiting continued improving intellectual awareness of cognitive deficits, referencing "thinking problems" throughout tasks with supervision A. At end of session, patient requested assistance to commode where he was continent of bowel. Patient was left in chair with call bell in reach and chair alarm set. SLP will continue to target goals per plan of care.      Pain Pain Assessment Pain Scale: 0-10 Pain Score: 4  Pain Location: Abdomen  Agitated Behavior Scale: Observation Details Observation Environment: CIR Start of observation period - Date: 03/26/23 Start of observation period - Time: 1445 End of observation period - Date: 03/26/23 End of observation period - Time: 1533 Agitated Behavior Scale (DO NOT LEAVE BLANKS) Short attention span, easy distractibility, inability to concentrate: Present to a slight degree Impulsive, impatient, low tolerance for pain or frustration: Absent Uncooperative, resistant to care, demanding: Absent Violent and/or threatening violence toward  people or property: Absent Explosive and/or unpredictable anger: Absent Rocking, rubbing, moaning, or other self-stimulating behavior: Absent Pulling at tubes, restraints, etc.: Absent Wandering from treatment areas: Absent Restlessness, pacing, excessive movement: Absent Repetitive behaviors, motor, and/or verbal: Absent Rapid, loud, or excessive talking: Absent Sudden changes of mood: Absent Easily initiated or excessive crying and/or laughter: Absent Self-abusiveness, physical and/or verbal: Absent Agitated behavior scale total score: 15  Therapy/Group: Individual Therapy  [name], M.A., CCC-SLP  [name] 03/26/2023, 4:10 PM

## 2023-03-27 DIAGNOSIS — N186 End stage renal disease: Secondary | ICD-10-CM | POA: Diagnosis not present

## 2023-03-27 DIAGNOSIS — R4182 Altered mental status, unspecified: Secondary | ICD-10-CM | POA: Diagnosis not present

## 2023-03-27 DIAGNOSIS — D631 Anemia in chronic kidney disease: Secondary | ICD-10-CM | POA: Diagnosis not present

## 2023-03-27 DIAGNOSIS — R5381 Other malaise: Secondary | ICD-10-CM | POA: Diagnosis not present

## 2023-03-27 DIAGNOSIS — Z992 Dependence on renal dialysis: Secondary | ICD-10-CM | POA: Diagnosis not present

## 2023-03-27 DIAGNOSIS — I120 Hypertensive chronic kidney disease with stage 5 chronic kidney disease or end stage renal disease: Secondary | ICD-10-CM | POA: Diagnosis not present

## 2023-03-27 DIAGNOSIS — N250 Renal osteodystrophy: Secondary | ICD-10-CM | POA: Diagnosis not present

## 2023-03-27 MED ORDER — DIPHENHYDRAMINE HCL 50 MG/ML IJ SOLN
25.0000 mg | Freq: Once | INTRAMUSCULAR | Status: DC
  Filled 2023-03-27 (×2): qty 1

## 2023-03-27 MED ORDER — ZIPRASIDONE HCL 40 MG PO CAPS
40.0000 mg | ORAL_CAPSULE | Freq: Every day | ORAL | Status: DC
  Administered 2023-03-28 – 2023-03-29 (×3): 40 mg via ORAL
  Filled 2023-03-27 (×4): qty 1

## 2023-03-27 MED ORDER — ZIPRASIDONE HCL 20 MG PO CAPS
20.0000 mg | ORAL_CAPSULE | Freq: Every day | ORAL | Status: DC
  Administered 2023-03-28 – 2023-04-03 (×7): 20 mg via ORAL
  Filled 2023-03-27 (×7): qty 1

## 2023-03-27 NOTE — Progress Notes (Signed)
PROGRESS NOTE   Subjective/Complaints:  Patient sitting in wheelchair at nurses station.  No new concerns today.  Nursing reports some instances of sundowning at night.  ROS: Limited due to cognitive/behavioral   Objective:   No results found. No results for input(s): "WBC", "HGB", "HCT", "PLT" in the last 72 hours.   No results for input(s): "NA", "K", "CL", "CO2", "GLUCOSE", "BUN", "CREATININE", "CALCIUM" in the last 72 hours.    Intake/Output Summary (Last 24 hours) at 03/27/2023 1227 Last data filed at 03/27/2023 0807 Gross per 24 hour  Intake 956 ml  Output --  Net 956 ml         Physical Exam: Vital Signs Blood pressure (!) 139/51, pulse (!) 102, temperature 97.6 F (36.4 C), temperature source Oral, resp. rate 18, height 5\' 4"  (1.626 m), weight 97.4 kg, SpO2 100%.  Constitutional: No distress . Vital signs reviewed.  Sitting in wheelchair at nursing station HEENT: NCAT, EOMI, oral membranes moist Neck: supple Cardiovascular: RRR without murmur. No JVD    Respiratory/Chest: CTA Bilaterally without wheezes or rales. Normal effort    GI/Abdomen: BS +, non-tender, non-distended Ext: no clubbing, cyanosis, or edema Psych: pleasant but confused  Neurologic Exam:   Alert.remembers me from last week.  Poor awareness and insight Oriented to self, hospital + Memory deficit - ongoing + Poor insight + Mild L ptosis   Sensory exam: Intact to touch in all 4 extremities Motor exam: moves all 4 extremities without limitiations Coordination: Slight bilateral upper extremity tremor, intention, mild.    Musculoskeletal: No joint swelling noted No abnormal tone      Assessment/Plan: 1. Functional deficits which require 15 hr per week of interdisciplinary therapy in a comprehensive inpatient rehab setting. Physiatrist is providing close team supervision and 24 hour management of active medical problems listed  below. Physiatrist and rehab team continue to assess barriers to discharge/monitor patient progress toward functional and medical goals  Care Tool:  Bathing    Body parts bathed by patient: Right arm, Left arm, Chest, Abdomen, Front perineal area, Right upper leg, Buttocks, Left upper leg, Right lower leg, Left lower leg, Face         Bathing assist Assist Level: Moderate Assistance - Patient 50 - 74%     Upper Body Dressing/Undressing Upper body dressing   What is the patient wearing?: Pull over shirt    Upper body assist Assist Level: Minimal Assistance - Patient > 75%    Lower Body Dressing/Undressing      Lower body dressing      What is the patient wearing?: Pants     Lower body assist Assist for lower body dressing: Moderate Assistance - Patient 50 - 74%     Toileting Toileting    Toileting assist Assist for toileting: Moderate Assistance - Patient 50 - 74%     Transfers Chair/bed transfer  Transfers assist     Chair/bed transfer assist level: Contact Guard/Touching assist     Locomotion Ambulation   Ambulation assist      Assist level: Minimal Assistance - Patient > 75% Assistive device: Hand held assist Max distance: 95'   Walk 10 feet activity  Assist     Assist level: Minimal Assistance - Patient > 75% Assistive device: Hand held assist   Walk 50 feet activity   Assist Walk 50 feet with 2 turns activity did not occur: Safety/medical concerns  Assist level: Minimal Assistance - Patient > 75% Assistive device: No Device    Walk 150 feet activity   Assist Walk 150 feet activity did not occur: Safety/medical concerns         Walk 10 feet on uneven surface  activity   Assist Walk 10 feet on uneven surfaces activity did not occur: Safety/medical concerns         Wheelchair     Assist Is the patient using a wheelchair?: Yes Type of Wheelchair: Manual    Wheelchair assist level: Dependent - Patient 0%       Wheelchair 50 feet with 2 turns activity    Assist        Assist Level: Dependent - Patient 0%   Wheelchair 150 feet activity     Assist      Assist Level: Dependent - Patient 0%   Blood pressure (!) 139/51, pulse (!) 102, temperature 97.6 F (36.4 C), temperature source Oral, resp. rate 18, height 5\' 4"  (1.626 m), weight 97.4 kg, SpO2 100%.  Medical Problem List and Plan: 1. Functional deficits secondary to metabolic encephalopathy due to sepsis secondary to catheter peritonitis and hypoxemia              -patient may not shower             -ELOS/Goals: 16-18 days, PT/OT Sup, SLP min A -tentative discharge date 9-9           -Continue CIR therapies including PT, OT, and SLP   -Will review records from [facility] -- has worsened since approximately 8/22, possible 8/19, consistent with records from nursing at [facility] with current behavior.    - continue enclosure bed for safety  -  therapy 15/7  -May need SNF as wife reports she cannot provide 24/7 care, Est discharge 9/13 currently  -Team conference today please see physician documentation under team conference tab  2.  Antithrombotics: -DVT/anticoagulation:  Pharmaceutical: Heparin, 03/23/23 changed to Lovenox yesterday to help improved compliance             -antiplatelet therapy: aspirin and Plavix   3. Pain Management: Tylenol as needed             -Lidoderm patches to back  -Elavil 25 mg at bedtime for nerve pain and sleep 8/27  -8/29: Pain well controlled on Elavil; DC lyrica, PRN Tylenol/oxy 2.5-5 mg d/t CKD  8-30: Appears patient has been getting pain medications together overnight along with antipsychotics, will discuss with nursing  8-31: Patient having an difficulty endorsing needs and not utilizing PRNs, scheduled oxycodone 5 mg 3 times daily and has 2.5 mg prn 3 times daily available.   9-1: Not utilizing PRNs, minimal complaints of pain, will discontinue scheduled given lethargy today..  -muscle rub  R shoulder  9/10 no reports of pain this morning   4. Mood/Behavior/Sleep: LCSW to evaluate and provide emotional support             -continue Seroquel 50 mg TID             -clonazepam 0.5 mg TID prn             -Geodon 10 mg IM q 12 hours prn             -  antipsychotic agents: n/a  -8-27: Agitated by soft restraints in bed, transition to 4 side rails, Posey alarm, and TeleSitter for now.  Veil bed on standby if needed, however feel we should avoid this if possible due to likelihood of agitating patient further   - 8/28: Placed in veil bed; wrist restraints for dialysis. Scheduled Geodon 20 mg twice daily; discontinue Seroquel and clonazepam due to no perceivable benefit. Add as needed Zyprexa 5 mg twice daily p.o. or IM due to recent cardiac arrhythmias and concern for QTc prolongation. As needed ramelteon for sleep. Stop gabapentin due to possible side effect of agitation  - 8/28: No improvement except temporarily with Geodon; add depakote 125 mg q8h for ongoing aggitation, mood stabilization. Increase elavil to 50 mg at bedtime.  Workup ongoing.    - 8/29: EEG negative, labs normal. MRI pending. Sleeping well and calm since starting depakote and increase in elavil. Will monitor tonight and wean Geodon in AM if appropriate.   8-30: MRI showing chronic microvascular changes, otherwise normal.  Increasingly calm and appropriate during the day, with ongoing agitation at nighttime.  Will increase nighttime Geodon to 40 mg; will discuss sleep log with nursing, as it has not been performed.  8-31 no sleep overnight per sleep log, again getting agitated around 9 PM and 5 AM.  Not responsive to as needed Zyprexa, responsive to Ativan.  Attempted to wean off scheduled a.m. Geodon today, but patient became extremely agitated and unable to redirect, calm down with readministration of medication.  Resume Geodon 20 mg every morning/40 mg every afternoon, increased Elavil to 75 mg nightly, adding trazodone 50 mg  nightly as needed.  P.o./IV Ativan 0.5 mg every 6 hours for agitation.  9-1: slept well overnight with trazodone and increased Elavil, no PRNs used.  Unfortunately, oversedated today.  Discontinue scheduled pain medications as above, start wean of Elavil to 50 mg x2 days -> 25 mg x2 days -> 10 mg x2 days, off due to no apparent benefit and no complaints of pain.  Reduce Geodon from 20 mg every morning/40 mg every evening to 20 mg twice daily.  Continue trazodone as needed for sleep, as this seems to be the difference for a restful night yesterday.  If he remains overly sedated, would dose reduce and DC Depakote next.  Keep Ativan as needed d/t dialysis not having other options available.  9/2-3--non agitated but still sleepy   -dc'ed depakote as I doubt it's doing much at this dose anyway   -continue geodon at 20mg  bid   -scheduled trazodone at HS with back up dose if needed    -sleep was intermittent last night   -requires enclosure bed for safety  9/4 continue enclosure bed for now, consider trying DC of this device early next week  -9/5 appears to be doing better overall this morning, continues to have intermittent agitation at times.  Continue enclosure bed  -9/6 agitation appears to be improving, patient is skeptical about medications and has been declining some of these.  Will discontinue multivitamin, RisaQuad to try to decrease pill burden  -9/7 continues to have intermittent episodes of agitation however overall improving, appears to be taking most of his medications  9/9 agitation and mental status appears to be improving -discussing discontinuation of enclosure bed with team 9/10 Geodon dose increased to 40 mg at night, continue 20 mg in the morning 5. Neuropsych/cognition: This patient is not capable of making decisions on his own behalf   -recommend neuropsych  consult   - 8/27: Ongoing aggitation; Consult hospitalist for further workup on potential contributors to encephalopathy.  Pending UA, LA 2.1-> 1.4; otherwise negative so far -Delirium precautions, sleep log, Veiled bed for safety and to minimize restraints -Wife has expressed that she is not interested in palliative care at this time; hopefully with above measures patient can get dialysis in the next 48 hours to limit worsening uremic encephalopathy and medical decline    - 8/29: Aggitation much improved, able to get some dialysis last night and today. See above for medication changes. Calling wife tonight to discuss.   8-30: Still with moderate cognitive deficits, however appears once patient is less agitated he is better able to concentrate and has better orientation.  MRI negative for acute findings, monitor.  9/3 agitation better. More sedated than anything at this point. Confused as well.    -depakote stopped   -continue other meds as rx'ed  9/9 seems to be fairly alert and calm today 6. Skin/Wound Care: Routine skin care checks             -sacral skin off-loading/protection   7. Fluids/Electrolytes/Nutrition: Strict Is and Os and follow-up chemistries per nephrology             -renal diet/ 1200 cc FR  - Renvela 800 mg 3 times daily for hyperphosphatemia added per nephrology 8-27   8: Hypotension: monitor TID and prn             -continue midodrine as needed during dialysis  Normotensive, monitor  9-1: Consistently 90s over 60s, has been stable but given findings of microvascular changes on MRI, may be too low for patient to perfuse properly.  Will add midodrine 5 mg tid and monitor.  DC Coreg as heart rate has been low to normal; may resume at low-dose once blood pressure improved.     03/27/2023    3:55 AM 03/26/2023    8:00 PM 03/26/2023    1:39 PM  Vitals with BMI  Systolic 139 119 147  Diastolic 51 58 76  Pulse 102 63 66    9/2 continue with ccurrent regimen. Bp's hr remain low   -elavil wean should help  9/9-10 BP stable, continue midodrine and monitor   9: Hyperlipidemia: continue statin    10: CAD s/p CABG 2023: on DAPT and statin.  - troponin elevated but stable, monitor 8/27   11: DM-2: A1c = 6.3% (at home on Lantus 22 units at bedtime, Tradjenta 5 mg daily)        -monitor PO intake             -continue SSI (0-6 units) does not appear to be requiring coverage   - 9/6 DC SSI, change CBGs to daily  9/8 discontinue CBGs   Recent Labs    03/26/23 1134 03/26/23 1641 03/26/23 2009  GLUCAP 102* 113* 95     12: ESRD: HD on Tues, Thurs, Sat -nephrology made aware   - Unable to tolerate 8/27 d/t aggitation, stopped early 8/28;  - attempting again 8/29, stopped early again -Per nephrology, redirectable with gum -Have to switch restraints during dialysis sessions from veil bed to regular bed, then resume veil bed order once at rehab -9/9 tolerating HD better   13: COPD/OSA; chronic home 2L O2 (Symbicort, Ventolin, prednisone 40 mg daily at home)             -continue symbicort  -Added home oxygen back 8-27   14: GERD: continue  Protonix   15: Non-sustained VT/pAF: per cardiology, Dr. Algie Coffer: -continue carvedilol 6.25 mg BID; consider increasing to 12.5 mg BID before starting low dose amiodarone  -consider Eliquis and discontinue asa and Plavix in one week if no active GI bleeding and stable hemoglobin from 8/24 -hemoglobin has remained stable but very low around 7, no recurrent A-fib, monitor for now given agitation/fall 8-31 and consider initiating Eliquis when safe  -Continue mag-ox to avoid hypomagnesemia -8-27: Single instance of bradycardia this morning 40s, may have been missed read, monitor through today and do not further adjust beta-blocker medications  -9-3 hr controlled after holding coreg -9/4-5 HR stable, continue to monitor -9/9 consider Eliquis this week if agitation continues to improve -9/10 will hold off on Eliquis due to sundowning at night, will see how he does with increase Geodon  16: HFrEF: Daily weights. Monitor for signs of overload  - 9/7  continue daily weights, fluid level management per hemodialysis team  Filed Weights   03/22/23 1808 03/24/23 0848 03/24/23 1151  Weight: 100.3 kg 98.7 kg 97.4 kg      17: Liver nodularity on CT scan: LFTs reported to be normal. Avoid hepatotoxic medications    -Abdominal ultrasound earlier this admission with minimal ascites; ammonia within normal limits  -8/31: Did discuss with nephrology whether p.o. lactulose would be beneficial despite normal ammonia, after reviewing records unsure that this would be beneficial    18: Epistaxis - resolved             -previously improved with flonase and saline spray   19: Morbid Obesity. Dietary counseling Body mass index is 39.01 kg/m.      LOS: 15 days A FACE TO FACE EVALUATION WAS PERFORMED  Fanny Dance 03/27/2023, 12:27 PM

## 2023-03-27 NOTE — Progress Notes (Signed)
Pt's case discussed with rehab CSW yesterday. Pt is for possible snf at d/c. Will await confirmation of final d/c plan to ensure pt is clipped to most appropriate out-pt HD clinic at d/c. Will assist as needed.   Olivia Canter Renal Navigator [phone]

## 2023-03-27 NOTE — Progress Notes (Signed)
Speech Language Pathology Weekly Progress and Session Note  Patient Details  Name: [name] [name] MRN: [id number] Date of Birth: 30-Sep-1950  Beginning of progress report period: March 20, 2023 End of progress report period: March 27, 2023  Today's Date: 03/27/2023 SLP Individual Time: 7253-6644 SLP Individual Time Calculation (min): 43 min  Short Term Goals: Week 2: SLP Short Term Goal 1 (Week 2): STGs=LTGs 2* ELOS SLP Short Term Goal 1 - Progress (Week 2): Not met   New Short Term Goals: Week 3: SLP Short Term Goal 1 (Week 3): Patient will demonstrate intellectual awareness of deficits via verbally stating 2 cognitive and 2 physical impairments given modA SLP Short Term Goal 2 (Week 3): Patient will demonstrate use of recommended safe swallow strategies during mealtimes with modA. SLP Short Term Goal 3 (Week 3): Patient will utilize external aids to orient to time and situation given minA SLP Short Term Goal 4 (Week 3): Patient will recall basic daily information and events in 80% of opportunities events given modA  Weekly Progress Updates: Patient continues to make slow gains towards therapy goals with behavior remaining largest barrier to improvement. Patient eventually intended to discharge 9/9, thus therapist working towards long term goals this reporting period. ELOS changed d/t need to search for SNF as option for next venue of care. Patient currently demonstrates awareness that waxes and wanes but overall requires maxA to identify cognitive and physical impairments being targeted in therapy sessions. Patient utilizes external aids to orient to date given modA and recalls basic daily information and events with maxA using visual aids. At most recent session, patient demonstrated fast rate of eating and large bites/sips resulting in choking episode. SLP recommended intermittent supervision, slow rate, and small sips/bites and will continue to monitor tolerance of regular/thin  liquid diet. Patient and family education ongoing. Patient will continue to benefit from skilled therapy services during remainder of CIR stay.    Intensity: Minumum of 1-2 x/day, 30 to 90 minutes Frequency: 3 to 5 out of 7 days Duration/Length of Stay: TBD d/t possible SNF placement Treatment/Interventions: Cognitive remediation/compensation;Environmental controls;Cueing hierarchy;Functional tasks;Therapeutic Activities;Internal/external aids;Patient/family education  Daily Session  Skilled Therapeutic Interventions: SLP conducted skilled therapy session targeting cognitive retraining and dysphagia management goals. SLP received patient upright in bed eating breakfast tray from [employer] provided by care partner, [name]. Patient pleasant throughout mealtime. As meal progressed, patient demonstrated shoveling/large bites behaviors followed by choking episode/suspected invasion of the airway with solid residue from oral cavity and/or pharyngeal space (difficult to determine at bedside). Suspect patient's fast rate/large bites contributed to difficulty, confounded by patient's preexisting COPD. SLP recommends continuation of current diet, now with slow rate and small bites/sips strategy implemented with intermittent supervision to provide cuing. Wife made aware of these modifications and SLP placed sign in and outside of room to notify staff of change. During cognitive tasks, patient required mod-maxA to utilize schedule to discuss daily events prior to SLP session. Patient utilized room aid to orient to date with minA. SLP noted at end of session, patient exhibited significant mood shift switching from pleasant to angry, verging on verbally agitated and minimally confused. Patient disoriented to location, though able to state he is at [facility], he seemed to struggle to grasp the concept that he was not at home. Patient requesting to go out on the porch, and during attempt to redirect, became angry,  feeling that the therapist had gotten rid of his house entirely. SLP eventually redirected to sit in wheelchair  with belt alarm placed and all needs in reach. SLP will continue to follow plan of care as appropriate.     Agitated Behavior Scale: Observation Details Observation Environment: CIR Start of observation period - Date: 03/27/23 Start of observation period - Time: 1045 End of observation period - Date: 03/27/23 End of observation period - Time: 1128 Agitated Behavior Scale (DO NOT LEAVE BLANKS) Short attention span, easy distractibility, inability to concentrate: Present to a slight degree Impulsive, impatient, low tolerance for pain or frustration: Present to a slight degree Uncooperative, resistant to care, demanding: Present to a slight degree Violent and/or threatening violence toward people or property: Absent Explosive and/or unpredictable anger: Absent Rocking, rubbing, moaning, or other self-stimulating behavior: Absent Pulling at tubes, restraints, etc.: Absent Wandering from treatment areas: Absent Restlessness, pacing, excessive movement: Absent Repetitive behaviors, motor, and/or verbal: Absent Rapid, loud, or excessive talking: Absent Sudden changes of mood: Present to a moderate degree Easily initiated or excessive crying and/or laughter: Absent Self-abusiveness, physical and/or verbal: Absent Agitated behavior scale total score: 19  Pain Pain Assessment Pain Scale: 0-10 Pain Score: 0-No pain  Therapy/Group: Individual Therapy  [name], M.A., CCC-SLP  [name] 03/27/2023, 2:40 PM

## 2023-03-27 NOTE — Progress Notes (Signed)
Patient ID: [name] [name], male   DOB: Dec 06, 1950, 72 y.o.   MRN: [id number]  SW met with pt wife while pt was sleep in room. SW provided updates from team conference, and informed as behaviors subside will submit referral for SNF. No additional/questions concerns reported.    Cecile Sheerer, MSW, LCSWA Office: [phone] Cell: [phone] Fax: [phone]

## 2023-03-27 NOTE — Patient Care Conference (Signed)
Inpatient RehabilitationTeam Conference and Plan of Care Update Date: 03/27/2023   Time: 10:39 AM    Patient Name: [name] [name]      Medical Record Number: [id number]  Date of Birth: [birth year] Sex: Male         Room/Bed: 4W13C/4W13C-01 Payor Info: Payor: MEDICARE / Plan: MEDICARE PART A AND B / Product Type: *No Product type* /    Admit Date/Time:  03/12/2023  4:25 PM  Primary Diagnosis:  Acute metabolic encephalopathy  Hospital Problems: Principal Problem:   Acute metabolic encephalopathy Active Problems:   Type 2 diabetes mellitus with chronic kidney disease on chronic dialysis, with long-term current use of insulin (HCC)   HFrEF (heart failure with reduced ejection fraction) (HCC)   Morbid obesity (HCC)   Encephalopathy   Epistaxis   PAF (paroxysmal atrial fibrillation) (HCC)   Long term (current) use of insulin (HCC)   ESRD on dialysis [facility])   Debility    Expected Discharge Date: Expected Discharge Date:  (SNF)  Team Members Present: Physician leading conference: Dr. Faith Rogue Social Worker Present: Cecile Sheerer, LCSWA Nurse Present: Vedia Pereyra, RN PT Present: Darrold Span, PT OT Present: Kearney Hard, OT SLP Present: Feliberto Gottron, SLP PPS Coordinator present : Fae Pippin, SLP     Current Status/Progress Goal Weekly Team Focus  Bowel/Bladder      ***          Swallow/Nutrition/ Hydration      ***         ADL's   Min A overall  *** Min A/supservision   behaviors have improved, but still unsafe with poor safety awareness, cognitive retraining    Mobility   CGA/minA overall, gait up to 130' minA  *** minA ambulatory household distances, [facility] for community  barriers: agitation, continue working on gait training, global strengthening and endurance    Communication      ***          Safety/Cognition/ Behavioral Observations  modA for overall cognition at most recent session; totalA for verbal basic problem solving  *** minA    continued participation, use of memory aids, use of external orientation aids, basic functional problem solving    Pain      ***          Skin      ***           Discharge Planning:  PT is now SNF placement as wife is unable to provide 24/7 care due to working twice a week, and other responsibilities. Pt is not ready to explore SNF options due to behaviors. When appropriate, referral will eb sent. SW will continue to confirm there are no barriers to discharge.   Team Discussion: Acute metabolic encephalopathy. Behavior Plan.  Enclosure bed.  Tele-sitter.  Remains restless and having hallucinations at night.  Will increase Geodon. Behaviors are redirectable during day hours.  Now able to attend Dialysis T/Th/Sat on a regular basis.  Restraints need to be removed prior to SNF placement.  Tolerating room air. Sleep chart.  Daily weight.  Tolerating renal-carb-mod diet with 1200 fluid restriction. Will not participate in ADLs unless wife is there. Continuing to work on Sport and exercise psychologist.  Therapies continue 15/7. Behaviors overall have improved.  Patient on target to meet rehab goals: yes, continues to progress in therapies with placement to SNF  *See Care Plan and progress notes for long and short-term goals.   Revisions to Treatment Plan:  Adjusting Geodon. Monitoring mood/behaviors.  Monitor labs/VS Teaching Needs: Medications, safety, self care, gait/transfer training, behavior/mood modifications, etc.   Current Barriers to Discharge: Decreased caregiver support, Insurance for SNF coverage, Hemodialysis, Medication compliance, and Behavior  Possible Resolutions to Barriers: Alternative safety measures use to maintain safe environment SNF placement Compliant with medications Able to self monitor behaviors/mood independently Order recommended DME if needed     Medical Summary Current Status: metabolic encephalopathy, hypotension, CAD, agitation, PAF   Barriers to Discharge: Behavior/Mood;Medical stability;Morbid Obesity;Renal Insufficiency/Failure  Barriers to Discharge Comments: metabolic encephalopathy, hypotension, CAD, agitation, PAF,  ESRD Possible Resolutions to [employer] Focus: consider dc enclosure bed, consider restart Eliquis   Continued Need for Acute Rehabilitation Level of Care: The patient requires daily medical management by a physician with specialized training in physical medicine and rehabilitation for the following reasons: Direction of a multidisciplinary physical rehabilitation program to maximize functional independence : Yes Medical management of patient stability for increased activity during participation in an intensive rehabilitation regime.: Yes Analysis of laboratory values and/or radiology reports with any subsequent need for medication adjustment and/or medical intervention. : Yes   I attest that I was present, lead the team conference, and concur with the assessment and plan of the team.   [name] 03/27/2023, 4:38 PM

## 2023-03-27 NOTE — Progress Notes (Signed)
Occupational Therapy Session Note  Patient Details  Name: [name] [name] MRN: [id number] Date of Birth: [birth year]  Today's Date: 03/27/2023 OT Individual Time: 6962-9528 OT Individual Time Calculation (min): 45 min  and Today's Date: 03/27/2023 OT Missed Time: 15 Minutes Missed Time Reason: Other (comment) (eating breakfast)  Short Term Goals: Week 3:  OT Short Term Goal 1 (Week 3): LTG=STG 2/2 ELOS  Skilled Therapeutic Interventions/Progress Updates:    Pt greeted seated in wc at nurses station finishing breakfast. Pt missed 15 minutes of OT due to finishing breakfast. Pt agreeable to go to his room for grooming tasks. Pt completed grooming tasks in sitting and noted that his shirt was on backwards. OT tried to get pt to change into clean shirt, but he refused and kept saying "I;ll do that tonight." Pt did however orient his shirt correctly. Pt brought to therapy gym and addressed dynamic balance and B UE coordination with basketball toss activity. Pt needed min to mod A for ambulating and dynamic balance when shooting basketball and no AD. Continued working on standing balance/endurance with standing corn hole toss. Pt much more pleasant and redirectable overall today. UB there-ex using 2 lb weighted bar. Bicep curls, chest press, Pt needed moderate cues to sustain attention due to distracting environment in the gym. Pt left back at the nurses station at end of session with alarm belt on, call bell in reach, and needs met.   Therapy Documentation Precautions:  Precautions Precautions: Fall Restrictions Weight Bearing Restrictions: No Pain:  Denies pain  Therapy/Group: Individual Therapy  [name] 03/27/2023, 8:03 AM

## 2023-03-27 NOTE — Progress Notes (Signed)
[facility] Progress Note   Subjective:    Sleeping but awakens to voice and engages NO c/o For HD today  Objective Vitals:   03/26/23 1339 03/26/23 1949 03/26/23 2000 03/27/23 0355  BP: (!) 141/76  (!) 119/58 (!) 139/51  Pulse: 66  63 (!) 102  Resp: 19  19 18   Temp: 97.9 F (36.6 C)  97.9 F (36.6 C) 97.6 F (36.4 C)  TempSrc: Oral  Oral Oral  SpO2: 99% 97% 92% 100%  Weight:      Height:       Physical Exam General: Awake, alert, calm, cooperative, NAD, on RA Heart:S1 and S2; No MRGs Lungs: Breathing unlabored, CTA throughout Abdomen: Round, soft, non-tender Extremities: trace edema b/l lower extremities Dialysis Access: AVF (+) B/T     Additional Objective Labs: Basic Metabolic Panel: Recent Labs  Lab 03/22/23 1235 03/24/23 0925  NA 135 136  K 4.6 4.2  CL 96* 99  CO2 28 24  GLUCOSE 139* 118*  BUN 43* 33*  CREATININE 9.84* 8.85*  CALCIUM 9.0 8.6*  PHOS 4.3 3.1   Liver Function Tests: Recent Labs  Lab 03/22/23 1235 03/24/23 0925  ALBUMIN 2.2* 2.0*   No results for input(s): "LIPASE", "AMYLASE" in the last 168 hours. CBC: Recent Labs  Lab 03/22/23 1235 03/24/23 0925  WBC 5.0 4.3  HGB 8.8* 8.1*  HCT 29.7* 28.1*  MCV 97.7 97.2  PLT 192 186   Blood Culture    Component Value Date/Time   SDES BLOOD RIGHT HAND 02/19/2023 1855   SPECREQUEST  02/19/2023 1855    BOTTLES DRAWN AEROBIC ONLY Blood Culture results may not be optimal due to an inadequate volume of blood received in culture bottles   CULT  02/19/2023 1855    NO GROWTH 5 DAYS Performed at [facility], [address], [place], [place] [address]    REPTSTATUS 02/24/2023 FINAL 02/19/2023 1855    Cardiac Enzymes: No results for input(s): "CKTOTAL", "CKMB", "CKMBINDEX", "TROPONINI" in the last 168 hours. CBG: Recent Labs  Lab 03/25/23 1728 03/26/23 0605 03/26/23 1134 03/26/23 1641 03/26/23 2009  GLUCAP 110* 98 102* 113* 95   Iron Studies: No results for  input(s): "IRON", "TIBC", "TRANSFERRIN", "FERRITIN" in the last 72 hours. @lablastinr3 @ Studies/Results: No results found. Medications:   (feeding supplement) PROSource Plus  30 mL Oral BID BM   aspirin EC  81 mg Oral Daily   atorvastatin  80 mg Oral Daily   Chlorhexidine Gluconate Cloth  6 each Topical Q0600   clopidogrel  75 mg Oral Daily   darbepoetin (ARANESP) injection - DIALYSIS  150 mcg Subcutaneous Q Sun-1800   divalproex  125 mg Oral Q8H   enoxaparin (LOVENOX) injection  30 mg Subcutaneous Q24H   feeding supplement  237 mL Oral BID WC   Gerhardt's butt cream  1 Application Topical QID   lidocaine  1 patch Transdermal Q2200   lidocaine  1 patch Transdermal Q24H   magnesium oxide  400 mg Oral Daily   midodrine  5 mg Oral TID WC   mometasone-formoterol  2 puff Inhalation BID   pantoprazole  40 mg Oral BID   sevelamer carbonate  800 mg Oral TID WC   traZODone  50 mg Oral QHS   [START ON 03/28/2023] ziprasidone  20 mg Oral Daily   ziprasidone  40 mg Oral Daily    Outpatient Dialysis Orders: Previously on [facility]CPD thru [facility] - changed to HD - will need outpatient HD  unit established prior to d/c.   Assessment/Plan: 1. Debility - in CIR, estimated discharge 9/13 2. Encephalopathy/agitation: Patient was having issues with agitation previously. Unclear of etiology. Ammonia fine and brain MRI with no acute findings. Currently, his behavior is improving but waxes/wanes. 3. ESRD:  Transitioned from PD to HD recently on TTS schedule. Using old AVF which is still functional, s/p f'gram 02/28/23.  Might req new CLIP if going to SNF 4. Hypertension/volume: BP soft with some hypotension on HD. No respiratory issues.  Working to establish dry weight. Reinforced fluid restrictions. Tolerating UFG 2L with next HD.  5. Anemia: Tsat 38% with ferritin 2175.  No iron. Increased aranesp to for 9/1. Hgb now 8.1.  6. Metabolic bone disease: CorrCa and phos in goal.  On Renvela 1 AC  TID. 7.  Nutrition:  Alb very low, continue supps.  Renal diet w/fluid restrictions.  8.  Hx NSVT: On Coreg 10.  Recent PD related peritonitis (+TNC >2000, cx's neg) and related +fungemia/ enterococcus bacteremia - underwent PD cath removal, TDC placement and HD cath placement and line holiday. Completed IV unasyn, IV micafungin and cipro through 8/15. TDC not replaced cause he had functional AVF.   11. Dispo: Patient's behavior is improving. He is still requiring a safety belt to prevent him from trying to stand/"look for his grand kids." Will need to be able to calmly sit through HD for a full outpatient treatment without trying to stand for his own safety. It does seem like we are moving in the right direction.   Arita Miss, MD  03/27/2023, 11:04 AM  [facility] Pager: [phone]

## 2023-03-27 NOTE — Progress Notes (Signed)
Physical Therapy TBI Note  Patient Details  Name: [name] [name] MRN: [id number] Date of Birth: 1950-09-11  Today's Date: 03/27/2023 PT Individual Time: 6301-6010 PT Individual Time Calculation (min): 44 min   Short Term Goals: Week 2:  PT Short Term Goal 1 (Week 2): STGs = LTGs  Skilled Therapeutic Interventions/Progress Updates:    Pt presents in room in bed, motivate to participate with PT secondary to being frustrated with being in enclosure bed. Session focused on gait training for dynamic standing balance and tolerance to  upright mobility. Pt completes all transfer CGA no device this session.  Pt bed mobility supervision, therapist dons shoes total assist. Pt completes stand step transfer CGA. Pt transported to day room dependently and provided with drink to improve participation with PT. Pt ambulates with R HHA CGA/min assist 125' and 55', pt self selecting rest break in chair after first trial, pt positions for sitting safely without verbal cues. Pt then ambulates 175'  CGA without UE support however pt ambulates to wall to utilize LUE support on handrail and nurses station during ambulation trial. Pt provided with verbal cues to trial without UE support with pt not demonstrating understanding.  Pt transported back to room, pt ambulates with CGA to bed and completes sit to supine with supervision. Pt remains in enclosure bed with call light in place at end of session.  Therapy Documentation Precautions:  Precautions Precautions: Fall Restrictions Weight Bearing Restrictions: No  Agitated Behavior Scale: TBI Observation Details Observation Environment: CIR Start of observation period - Date: 03/27/23 Start of observation period - Time: 0948 End of observation period - Date: 03/27/23 End of observation period - Time: 1032 Agitated Behavior Scale (DO NOT LEAVE BLANKS) Short attention span, easy distractibility, inability to concentrate: Present to a slight degree Impulsive,  impatient, low tolerance for pain or frustration: Absent Uncooperative, resistant to care, demanding: Absent Violent and/or threatening violence toward people or property: Absent Explosive and/or unpredictable anger: Absent Rocking, rubbing, moaning, or other self-stimulating behavior: Absent Pulling at tubes, restraints, etc.: Absent Wandering from treatment areas: Absent Restlessness, pacing, excessive movement: Absent Repetitive behaviors, motor, and/or verbal: Absent Rapid, loud, or excessive talking: Absent Sudden changes of mood: Absent Easily initiated or excessive crying and/or laughter: Absent Self-abusiveness, physical and/or verbal: Absent Agitated behavior scale total score: 15   Therapy/Group: Individual Therapy  Edwin Cap PT, DPT 03/27/2023, 11:30 AM

## 2023-03-27 NOTE — Progress Notes (Signed)
Patient cooperative w/o discomfort or agitation Ativan provided pre- dialysis  2005 Transported via bed to diaysis

## 2023-03-28 ENCOUNTER — Encounter (HOSPITAL_COMMUNITY): Payer: Medicare Other

## 2023-03-28 DIAGNOSIS — Z992 Dependence on renal dialysis: Secondary | ICD-10-CM | POA: Diagnosis not present

## 2023-03-28 DIAGNOSIS — I480 Paroxysmal atrial fibrillation: Secondary | ICD-10-CM | POA: Diagnosis not present

## 2023-03-28 DIAGNOSIS — R5381 Other malaise: Secondary | ICD-10-CM | POA: Diagnosis not present

## 2023-03-28 DIAGNOSIS — N186 End stage renal disease: Secondary | ICD-10-CM | POA: Diagnosis not present

## 2023-03-28 DIAGNOSIS — I441 Atrioventricular block, second degree: Secondary | ICD-10-CM

## 2023-03-28 DIAGNOSIS — I2510 Atherosclerotic heart disease of native coronary artery without angina pectoris: Secondary | ICD-10-CM | POA: Diagnosis not present

## 2023-03-28 DIAGNOSIS — I120 Hypertensive chronic kidney disease with stage 5 chronic kidney disease or end stage renal disease: Secondary | ICD-10-CM | POA: Diagnosis not present

## 2023-03-28 DIAGNOSIS — N250 Renal osteodystrophy: Secondary | ICD-10-CM | POA: Diagnosis not present

## 2023-03-28 DIAGNOSIS — R4182 Altered mental status, unspecified: Secondary | ICD-10-CM | POA: Diagnosis not present

## 2023-03-28 DIAGNOSIS — D631 Anemia in chronic kidney disease: Secondary | ICD-10-CM | POA: Diagnosis not present

## 2023-03-28 LAB — HEPATITIS PANEL, ACUTE
HCV Ab: NONREACTIVE
Hep A IgM: NONREACTIVE
Hep B C IgM: NONREACTIVE
Hepatitis B Surface Ag: NONREACTIVE

## 2023-03-28 MED ORDER — APIXABAN 2.5 MG PO TABS: 2.5000 mg | ORAL_TABLET | Freq: Two times a day (BID) | ORAL | Status: DC

## 2023-03-28 MED ORDER — CHLORHEXIDINE GLUCONATE CLOTH 2 % EX PADS
6.0000 | MEDICATED_PAD | Freq: Every day | CUTANEOUS | Status: DC
  Administered 2023-03-28 – 2023-03-29 (×2): 6 via TOPICAL

## 2023-03-28 NOTE — Progress Notes (Signed)
   03/28/23 0015  Vitals  BP 130/65  MAP (mmHg) 76  BP Location Right Arm  BP Method Automatic  Patient Position (if appropriate) Lying  Pulse Rate 77  ECG Heart Rate 75  Resp 17  During Treatment Monitoring  Blood Flow Rate (mL/min) 0 mL/min  Arterial Pressure (mmHg) -0.4 mmHg  Venous Pressure (mmHg) -1.01 mmHg  TMP (mmHg) -50.91 mmHg  Ultrafiltration Rate (mL/min) 765 mL/min  Dialysate Flow Rate (mL/min) 299 ml/min  Dialysate Potassium Concentration 3  Dialysate Calcium Concentration 2.5  Duration of HD Treatment -hour(s) 3.08 hour(s)  Cumulative Fluid Removed (mL) per Treatment  1681.39   Pt Confuse and resisting treatment.Dr. Juel Burrow Notified. Pt refuse medication

## 2023-03-28 NOTE — Progress Notes (Signed)
This nurse was called into room after patients telesitter monitoring was going off. Nurse entered room and patient was in bed. Wife stated he stood up from wheelchair and would not sit back down , telesitter alarm went off and scared patient and patient sat on floor. Patient did not hit his head, has no pain at this time . Vitals stable , provider called and notified. No new orders at this time. Patient in bed resting, wife at bedside.   Tori Milks, LPN

## 2023-03-28 NOTE — Discharge Instructions (Addendum)
Inpatient Rehab Discharge Instructions  [name] [name] Discharge date and time: 05/07/2023  Activities/Precautions/ Functional Status: Activity: no lifting, driving, or strenuous exercise until cleared by MD Diet: renal diet Wound Care: none needed Functional status:  ___ No restrictions     ___ Walk up steps independently _x__ 24/7 supervision/assistance   ___ Walk up steps with assistance ___ Intermittent supervision/assistance  ___ Bathe/dress independently ___ Walk with walker     ___ Bathe/dress with assistance ___ Walk Independently    ___ Shower independently ___ Walk with assistance    _x__ Shower with assistance _x__ No alcohol     ___ Return to work/school ________  Special Instructions: No driving, alcohol consumption or tobacco use.  COMMUNITY REFERRALS UPON DISCHARGE:    Home Health:   PT     OT     ST    SNA                Agency:[facility]  [phone] *Please expect follow-up within 2-3 days to schedule your discharge date. If you have not received follow-up, be sure to contact the branch directly.*    My questions have been answered and I understand these instructions. I will adhere to these goals and the provided educational materials after my discharge from the hospital.  Patient/Caregiver Signature _______________________________ Date __________  Clinician Signature _______________________________________ Date __________  Please bring this form and your medication list with you to all your follow-up doctor's appointments.

## 2023-03-28 NOTE — Consult Note (Addendum)
Cardiology Consultation   Patient ID: [name] [name] MRN: [id number]; DOB: October 03, 1950  Admit date: 03/12/2023 Date of Consult: 03/28/2023  PCP:  Benetta Spar, MD   [facility] Providers Cardiologist:  Chrystie Nose, MD  Electrophysiologist:  Regan Lemming, MD       Patient Profile:   [name] [name] is a 72 y.o. male with a hx of CAD (s/p CABG x2 following severe in-stent restenosis of RCA November 2023), COPD, DM type II, GERD, ESRD, hypotension on midodrine, paroxysmal atrial fibrillation, previous 2nd degree type I AVB with baseline 1st degree AVB who is being seen 03/28/2023 for the evaluation of bradycardia/heart block as well as anticoagulation/anti-platelet regimen review at the request of Dr. Natale Lay.  History of Present Illness:   [name] [name] was originally admitted to [facility] on 02/15/23 with confusion and progressive shortness of breath. He was ultimately found with sepsis secondary to peritoneal dialysis associated catheter peritonitis and polymicrobial bacteremia/fungemia. He was treated for his infection and ultimately transitioned to HD through AV fistula. Patient was then discharged and admitted to [facility] on 8/16 for further management.   [facility], patient developed paroxysmal atrial fibrillation and was started on Eliquis in addition to his Plavix and ASA. On 8/26, patient discharged to inpatient rehab. Per notes, patient with increased agitation and a fall that prompted holding of Eliquis as of 8/31. Notes from today 9/11 report "will restart Eliquis at 2.5mg  BID dosing. Will consult cardiology regarding need for ASA/Plavix and coreg." Patient also found with bradycardia today and ECG consistent with second degree AVB type I.   Review of patient's chart shows that he was admitted 05/30/2022 with worsening chest pain with associated SOB, not relieved with SL nitro. LHC showed severe in-stent RCA stenosis as well as  progression of his LAD stenosis. Pt underwent off pump CABG x 2 utilizing LIMA to LAD and SVG to PDA with Dr. Cliffton Asters. Following his bypass, EP consulted for second degree AVB & it was determined that because of lack of daytime pacing or symptoms, there was not indication for PPM at that time.   On exam today, patient reports significant positive progression from acute illness.  We discussed possible symptoms associated with bradycardia including dizziness/lightheadedness, unusual fatigue, orthostatic symptoms.  Patient strongly denies experiencing any of these while completing inpatient rehab activities.  Past Medical History:  Diagnosis Date   Anemia    Arthritis    Asthma    Bell palsy    CAD (coronary artery disease)    a. 2014 MV: abnl w/ infap ischemia; b. 03/2013 Cath: aneurysmal bleb in the LAD w/ otw nonobs dzs-->Med Rx.   Chronic back pain    Chronic knee pain    a. 09/2015 s/p R TKA.   Chronic pain    Chronic shoulder pain    Chronic sinusitis    COPD (chronic obstructive pulmonary disease) (HCC)    Diabetes mellitus without complication (HCC)    type II    ESRD on peritoneal dialysis (HCC)    on peritoneal dialysis, [facility]   Essential hypertension    GERD (gastroesophageal reflux disease)    Gout    Gout    Hepatomegaly    noted on noncontrast CT 2015   History of hiatal hernia    Hyperlipidemia    Lateral meniscus tear    Obesity    Truncal   Obstructive sleep apnea    does not use cpap  On home oxygen therapy    uses 2l when is going somewhere per patient    PUD (peptic ulcer disease)    remote, reports f/u EGD about 8 years ago unremarkable    Reactive airway disease    related to exposure to chemical during 9/11   Sinusitis    Vitamin D deficiency     Past Surgical History:  Procedure Laterality Date   A/V FISTULAGRAM N/A 02/28/2023   Procedure: A/V Fistulagram;  Surgeon: Victorino Sparrow, MD;  Location: [facility] INVASIVE CV LAB;  Service:  Cardiovascular;  Laterality: N/A;   ASAD LT SHOULDER  12/15/2008   left shoulder   AV FISTULA PLACEMENT Left 08/09/2016   Procedure: BRACHIOCEPHALIC ARTERIOVENOUS (AV) FISTULA CREATION LEFT ARM;  Surgeon: Sherren Kerns, MD;  Location: [facility] OR;  Service: Vascular;  Laterality: Left;   CAPD INSERTION N/A 10/07/2018   Procedure: LAPAROSCOPIC PERITONEAL CATHETER PLACEMENT;  Surgeon: Rodman Pickle, MD;  Location: [facility] ORS;  Service: General;  Laterality: N/A;   CATARACT EXTRACTION W/PHACO Left 03/28/2016   Procedure: CATARACT EXTRACTION PHACO AND INTRAOCULAR LENS PLACEMENT LEFT EYE;  Surgeon: Jethro Bolus, MD;  Location: [facility] ORS;  Service: Ophthalmology;  Laterality: Left;  CDE: 4.77   CATARACT EXTRACTION W/PHACO Right 04/11/2016   Procedure: CATARACT EXTRACTION PHACO AND INTRAOCULAR LENS PLACEMENT RIGHT EYE; CDE:  4.74;  Surgeon: Jethro Bolus, MD;  Location: [facility] ORS;  Service: Ophthalmology;  Laterality: Right;   COLONOSCOPY  10/15/2008   Fields: Rectal polyp obliterated, not retrieved, hemorrhoids, single ascending colon diverticulum near the CV. Next colonoscopy April 2020   COLONOSCOPY N/A 12/25/2014   SLF: 1. Colorectal polyps (2) removed 2. Small internal hemorrhoids 3. the left colon is severely redundant. hyperplastic polyps   CORONARY ARTERY BYPASS GRAFT N/A 06/05/2022   Procedure: OFF PUMP CORONARY ARTERY BYPASS GRAFTING (CABG) X 2 BYPASSES USING LEFT INTERNAL MAMMARY ARTERY AND RIGHT LEG GREATER SAPHENOUS VEIN HARVESTED ENDOSCOPICALLY;  Surgeon: Corliss Skains, MD;  Location: [facility] OR;  Service: Open Heart Surgery;  Laterality: N/A;   CORONARY STENT INTERVENTION N/A 07/25/2021   Procedure: CORONARY STENT INTERVENTION;  Surgeon: Tonny Bollman, MD;  Location: [facility] INVASIVE CV LAB;  Service: Cardiovascular;  Laterality: N/A;   CORONARY STENT INTERVENTION N/A 12/26/2021   Procedure: CORONARY STENT INTERVENTION;  Surgeon: Yvonne Kendall, MD;  Location: [facility] INVASIVE CV LAB;  Service:  Cardiovascular;  Laterality: N/A;   CORONARY STENT INTERVENTION N/A 01/20/2022   Procedure: CORONARY STENT INTERVENTION;  Surgeon: Tonny Bollman, MD;  Location: [facility] INVASIVE CV LAB;  Service: Cardiovascular;  Laterality: N/A;   DOPPLER ECHOCARDIOGRAPHY     ESOPHAGOGASTRODUODENOSCOPY N/A 12/25/2014   SLF: 1. Anemia most likely due to CRI, gastritis, gastric polyps 2. Moderate non-erosive gastriits and mild duodenitis.  3.TWo large gstric polyps removed.    EYE SURGERY  12/22/2010   tear duct probing-[place]   FOREIGN BODY REMOVAL  03/29/2011   Procedure: REMOVAL FOREIGN BODY EXTREMITY;  Surgeon: Fuller Canada, MD;  Location: [facility]S;  Service: Orthopedics;  Laterality: Right;  Removal Foreign Body Right Thumb   IR FLUORO GUIDE CV LINE RIGHT  08/06/2018   IR FLUORO GUIDE CV LINE RIGHT  02/17/2023   IR US GUIDE VASC ACCESS RIGHT  08/06/2018   IR US GUIDE VASC ACCESS RIGHT  02/17/2023   KNEE ARTHROSCOPY  10/16/2007   left   KNEE ARTHROSCOPY WITH LATERAL MENISECTOMY Right 10/14/2015   Procedure: LEFT KNEE ARTHROSCOPY WITH PARTIAL LATERAL MENISECTOMY;  Surgeon: Vickki Hearing, MD;  Location: [facility];  Service: Orthopedics;  Laterality: Right;   LEFT HEART CATH AND CORONARY ANGIOGRAPHY N/A 07/25/2021   Procedure: LEFT HEART CATH AND CORONARY ANGIOGRAPHY;  Surgeon: Tonny Bollman, MD;  Location: [facility] INVASIVE CV LAB;  Service: Cardiovascular;  Laterality: N/A;   LEFT HEART CATH AND CORONARY ANGIOGRAPHY N/A 12/26/2021   Procedure: LEFT HEART CATH AND CORONARY ANGIOGRAPHY;  Surgeon: Yvonne Kendall, MD;  Location: [facility] INVASIVE CV LAB;  Service: Cardiovascular;  Laterality: N/A;   LEFT HEART CATH AND CORONARY ANGIOGRAPHY N/A 01/20/2022   Procedure: LEFT HEART CATH AND CORONARY ANGIOGRAPHY;  Surgeon: Tonny Bollman, MD;  Location: [facility] INVASIVE CV LAB;  Service: Cardiovascular;  Laterality: N/A;   LEFT HEART CATHETERIZATION WITH CORONARY ANGIOGRAM N/A 03/28/2013   Procedure: LEFT HEART CATHETERIZATION WITH  CORONARY ANGIOGRAM;  Surgeon: Marykay Lex, MD;  Location: [facility] CATH LAB;  Service: Cardiovascular;  Laterality: N/A;   NM MYOVIEW LTD     PENILE PROSTHESIS IMPLANT N/A 08/16/2015   Procedure: PENILE PROTHESIS INFLATABLE, three piece, Excisional biopsy of Penile ulcer, Penile molding;  Surgeon: Jethro Bolus, MD;  Location: [facility] ORS;  Service: Urology;  Laterality: N/A;   PENILE PROSTHESIS IMPLANT N/A 12/24/2017   Procedure: REMOVAL AND  REPLACEMENT  COLOPLAST PENILE PROSTHESIS;  Surgeon: Crista Elliot, MD;  Location: [facility] ORS;  Service: Urology;  Laterality: N/A;   PERIPHERAL VASCULAR BALLOON ANGIOPLASTY  02/28/2023   Procedure: PERIPHERAL VASCULAR BALLOON ANGIOPLASTY;  Surgeon: Victorino Sparrow, MD;  Location: [facility] INVASIVE CV LAB;  Service: Cardiovascular;;   PORT-A-CATH REMOVAL N/A 02/19/2023   Procedure: REMOVAL OF PERITONEAL DIALYSIS CATHETER;  Surgeon: Victorino Sparrow, MD;  Location: [facility] OR;  Service: Vascular;  Laterality: N/A;   QUADRICEPS TENDON REPAIR  07/21/2011   Procedure: REPAIR QUADRICEP TENDON;  Surgeon: Fuller Canada, MD;  Location: [facility];  Service: Orthopedics;  Laterality: Right;   RIGHT/LEFT HEART CATH AND CORONARY ANGIOGRAPHY N/A 05/30/2022   Procedure: RIGHT/LEFT HEART CATH AND CORONARY ANGIOGRAPHY;  Surgeon: Corky Crafts, MD;  Location: [facility] INVASIVE CV LAB;  Service: Cardiovascular;  Laterality: N/A;   TEE WITHOUT CARDIOVERSION N/A 06/05/2022   Procedure: TRANSESOPHAGEAL ECHOCARDIOGRAM (TEE);  Surgeon: Corliss Skains, MD;  Location: [facility] OR;  Service: Open Heart Surgery;  Laterality: N/A;   TOENAIL EXCISION     removed x2-bilateral   UMBILICAL HERNIA REPAIR  07/17/2005   [place]     Home Medications:  Prior to Admission medications   Medication Sig Start Date End Date Taking? Authorizing Provider  albuterol (VENTOLIN HFA) 108 (90 Base) MCG/ACT inhaler Inhale 2 puffs into the lungs every 6 (six) hours as needed for wheezing or shortness of breath.  03/25/21   Oretha Milch, MD  aspirin EC 81 MG tablet Take 81 mg by mouth daily. Swallow whole.    [provider]  atorvastatin (LIPITOR) 80 MG tablet Take 1 tablet (80 mg total) by mouth daily. 06/20/22   Ardelle Balls, PA-C  clopidogrel (PLAVIX) 75 MG tablet Take 1 tablet (75 mg total) by mouth daily. Please take first dose at 9 pm on 12/4 06/19/22   Ardelle Balls, PA-C  feeding supplement (ENSURE ENLIVE / ENSURE PLUS) LIQD Take 237 mLs by mouth 2 (two) times daily between meals. 03/02/23   Ghimire, Werner Lean, MD  fluticasone (FLONASE) 50 MCG/ACT nasal spray Place 1 spray into both nostrils as needed for allergies or rhinitis. Patient not taking: Reported on 02/16/2023 10/11/22   [provider]  insulin aspart (  NOVOLOG) 100 UNIT/ML injection 0-6 Units, Subcutaneous, 3 times daily with meals, CBG < 70: Implement Hypoglycemia measures CBG 70 - 120: 0 units CBG 121 - 150: 0 units CBG 151 - 200: 1 unit CBG 201-250: 2 units CBG 251-300: 3 units CBG 301-350: 4 units CBG 351-400: 5 units CBG > 400: Give 6 units and call MD 03/02/23   Maretta Bees, MD  midodrine (PROAMATINE) 10 MG tablet Take 1 tablet (10 mg total) by mouth 3 (three) times daily with meals. 03/02/23   Ghimire, Werner Lean, MD  midodrine (PROAMATINE) 10 MG tablet Take 1 tablet (10 mg total) by mouth every dialysis (give 30- 60 min prior to each HD session). 03/02/23   Ghimire, Werner Lean, MD  multivitamin (RENA-VIT) TABS tablet Take 1 tablet by mouth at bedtime. 03/02/23   Ghimire, Werner Lean, MD  nitroGLYCERIN (NITROSTAT) 0.4 MG SL tablet Place 1 tablet (0.4 mg total) under the tongue every 5 (five) minutes as needed for chest pain. 03/02/23   Ghimire, Werner Lean, MD  Nystatin (GERHARDT'S BUTT CREAM) CREA Apply 1 Application topically 4 (four) times daily. 03/02/23   Ghimire, Werner Lean, MD  pantoprazole (PROTONIX) 40 MG tablet Take 1 tablet (40 mg total) by mouth 2 (two) times daily. 03/02/23   Ghimire, Werner Lean, MD   SYMBICORT 160-4.5 MCG/ACT inhaler INHALE 2 PUFFS INTO THE LUNGS TWICE DAILY. 01/08/23   Leslye Peer, MD    Inpatient Medications: Scheduled Meds:  (feeding supplement) PROSource Plus  30 mL Oral BID BM   apixaban  2.5 mg Oral BID   aspirin EC  81 mg Oral Daily   atorvastatin  80 mg Oral Daily   Chlorhexidine Gluconate Cloth  6 each Topical Q0600   Chlorhexidine Gluconate Cloth  6 each Topical Q0600   clopidogrel  75 mg Oral Daily   darbepoetin (ARANESP) injection - DIALYSIS  150 mcg Subcutaneous Q Sun-1800   diphenhydrAMINE  25 mg Intravenous Once   divalproex  125 mg Oral Q8H   feeding supplement  237 mL Oral BID WC   Gerhardt's butt cream  1 Application Topical QID   lidocaine  1 patch Transdermal Q2200   lidocaine  1 patch Transdermal Q24H   magnesium oxide  400 mg Oral Daily   midodrine  5 mg Oral TID WC   mometasone-formoterol  2 puff Inhalation BID   pantoprazole  40 mg Oral BID   sevelamer carbonate  800 mg Oral TID WC   traZODone  50 mg Oral QHS   ziprasidone  20 mg Oral Daily   ziprasidone  40 mg Oral Daily   Continuous Infusions:  PRN Meds: acetaminophen, bisacodyl, guaiFENesin-dextromethorphan, LORazepam **OR** LORazepam, Muscle Rub, ondansetron **OR** ondansetron (ZOFRAN) IV, oxyCODONE, traZODone  Allergies:    Allergies  Allergen Reactions   Opana [Oxymorphone Hcl] Itching   Tramadol Itching    Social History:   Social History   Socioeconomic History   Marital status: Married    Spouse name: Not on file   Number of children: 2   Years of education: 12th grade   Highest education level: Not on file  Occupational History   Occupation: disabled   Occupation:      Associate Professor: UNEMPLOYED  Tobacco Use   Smoking status: Former    Current packs/day: 0.00    Average packs/day: 1 pack/day for 25.0 years (25.0 ttl pk-yrs)    Types: Cigarettes    Start date: 03/27/1985    Quit date: 03/27/2010  Years since quitting: 13.0   Smokeless tobacco: Never    Tobacco comments:    Quit x 7 years  Vaping Use   Vaping status: Never Used  Substance and Sexual Activity   Alcohol use: Not Currently    Comment: occasionally   Drug use: Yes    Types: Marijuana    Comment: cocaine- last time used- 11/24/2017 , marijuana-    Sexual activity: Yes  Other Topics Concern   Not on file  Social History Narrative   He quit smoking in 2010. 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 He developed pulmonary problems, became disabled because of lower airway disease in 2009.       WATCHES BASKETBALL. HIS TEAM IS [place].   Social Determinants of Health   Financial Resource Strain: Low Risk  (03/05/2023)   Received from [facility]   Overall Financial Resource Strain (CARDIA)    Difficulty of Paying Living Expenses: Not hard at all  Food Insecurity: No Food Insecurity (03/05/2023)   Received from [facility]   Hunger Vital Sign    Worried About Running Out of Food in the Last Year: Never true    Ran Out of Food in the Last Year: Never true  Transportation Needs: No Transportation Needs (03/12/2023)   Received from [facility]   SM SDOH Transportation Source    Has lack of transportation kept you from medical appointments or from getting medications?: No    Has lack of transportation kept you from meetings, work, or from getting things needed for daily living?: No  Physical Activity: Sufficiently Active (12/29/2021)   Exercise Vital Sign    Days of Exercise per Week: 3 days    Minutes of Exercise per Session: 60 min  Stress: Patient Declined (03/12/2023)   Received from [facility]   [employer] of Occupational Health - Occupational Stress Questionnaire    Feeling of Stress : Patient declined  Social Connections: Moderately Integrated (03/05/2023)   Received from [facility]   Social Connection and Isolation Panel [NHANES]    Frequency of Communication with Friends and Family: More than three times  a week    Frequency of Social Gatherings with Friends and Family: More than three times a week    Attends Religious Services: More than 4 times per year    Active Member of [employer] or Organizations: No    Attends Banker Meetings: Never    Marital Status: Married  Catering manager Violence: Not At Risk (03/02/2023)   Received from [facility]   Domestic Abuse Assessment    Do you feel safe in your relationships at home?: Yes    Physical Abuse: Denies    HRSN Domestic Abuse - Type of Abuse: Not on file    HRSN Domestic Abuse - Time Frame: Not on file    HRSN Domestic Abuse - Signs and Symptoms: Not on file    Verbal Abuse: Denies    HRSN Domestic Abuse - Reported To: Not on file    Family History:    Family History  Problem Relation Age of Onset   Hypertension Mother        MI   Cancer Mother        breast    Diabetes Mother    Diabetes Father    Hypertension Father    Hypertension Sister    Diabetes Sister    Arthritis Other    Asthma Other  Lung disease Other    Anesthesia problems Neg Hx    Hypotension Neg Hx    Malignant hyperthermia Neg Hx    Pseudochol deficiency Neg Hx    Colon cancer Neg Hx      ROS:  Please see the history of present illness.   All other ROS reviewed and negative.     Physical Exam/Data:   Vitals:   03/27/23 2353 03/28/23 0015 03/28/23 0104 03/28/23 0343  BP: 116/62 130/65 131/63 137/70  Pulse: 63 77 72 (!) 53  Resp: (!) 29 17 18 18   Temp: 98.6 F (37 C)  97.9 F (36.6 C)   TempSrc: Oral  Oral   SpO2: 100%  90% 93%  Weight:      Height:        Intake/Output Summary (Last 24 hours) at 03/28/2023 1450 Last data filed at 03/28/2023 1308 Gross per 24 hour  Intake 580 ml  Output 1700 ml  Net -1120 ml      03/24/2023   11:51 AM 03/24/2023    8:48 AM 03/22/2023    6:08 PM  Last 3 Weights  Weight (lbs) 214 lb 11.7 oz 217 lb 9.5 oz 221 lb 1.9 oz  Weight (kg) 97.4 kg 98.7 kg 100.3 kg     Body mass index is 36.86 kg/m.   General:  Well nourished, well developed, in no acute distress HEENT: normal Neck: no JVD Vascular: No carotid bruits; Distal pulses 2+ bilaterally Cardiac:  normal S1, S2; RRR; no murmur  Lungs:  clear to auscultation bilaterally, no wheezing, rhonchi or rales  Abd: soft, nontender, no hepatomegaly  Ext: no edema Musculoskeletal:  No deformities, BUE and BLE strength normal and equal.  Left AV fistula noted Skin: warm and dry  Neuro:  CNs 2-12 intact, no focal abnormalities noted Psych:  Normal affect   EKG:  The EKG was personally reviewed and demonstrates: Sinus rhythm with Mobitz type I and incomplete left bundle branch block.  This tracing is very similar to previous Mobitz type I seen following patient's bypass November 2023. Telemetry: No telemetry data available  Relevant CV Studies:  02/19/23 TTE  IMPRESSIONS     1. Akinesis of the anterior /anteroseptal/inferoseptal wall from base to  apex. Left ventricular ejection fraction, by estimation, is 40 to 45%. The  left ventricle has mildly decreased function. Left ventricular diastolic  parameters are indeterminate.   2. Right ventricular systolic function is moderately reduced. The right  ventricular size is normal.   3. Left atrial size was mildly dilated.   4. The mitral valve was not well visualized. No evidence of mitral valve  regurgitation.   5. The aortic valve was not well visualized. Aortic valve regurgitation  is not visualized. Mild aortic valve stenosis.   6. The inferior vena cava is normal in size with greater than 50%  respiratory variability, suggesting right atrial pressure of 3 mmHg.   Comparison(s): No significant change from prior study.   FINDINGS   Left Ventricle: Akinesis of the anterior /anteroseptal/inferoseptal wall  from base to apex. Left ventricular ejection fraction, by estimation, is  40 to 45%. The left ventricle has mildly decreased function. Definity  contrast agent was given IV to   delineate the left ventricular endocardial borders. The left ventricular  internal cavity size was normal in size. There is no left ventricular  hypertrophy. Left ventricular diastolic parameters are indeterminate.   Right Ventricle: The right ventricular size is normal. Right ventricular  systolic function  is moderately reduced.   Left Atrium: Left atrial size was mildly dilated.   Right Atrium: Right atrial size was normal in size.   Pericardium: There is no evidence of pericardial effusion.   Mitral Valve: The mitral valve was not well visualized. No evidence of  mitral valve regurgitation.   Tricuspid Valve: Tricuspid valve regurgitation is mild.   Aortic Valve: The aortic valve was not well visualized. Aortic valve  regurgitation is not visualized. Mild aortic stenosis is present. Aortic  valve mean gradient measures 15.0 mmHg. Aortic valve peak gradient  measures 27.2 mmHg. Aortic valve area, by VTI   measures 1.57 cm.   Pulmonic Valve: Pulmonic valve regurgitation is not visualized.   Aorta: The aortic root and ascending aorta are structurally normal, with  no evidence of dilitation.   Venous: The inferior vena cava is normal in size with greater than 50%  respiratory variability, suggesting right atrial pressure of 3 mmHg.   IAS/Shunts: No atrial level shunt detected by color flow Doppler.    Laboratory Data:  High Sensitivity Troponin:   Recent Labs  Lab 03/02/23 0210 03/02/23 0910 03/03/23 0144 03/13/23 2021 03/14/23 0726  TROPONINIHS 99* 113* 97* 109* 107*     Chemistry Recent Labs  Lab 03/22/23 1235 03/24/23 0925  NA 135 136  K 4.6 4.2  CL 96* 99  CO2 28 24  GLUCOSE 139* 118*  BUN 43* 33*  CREATININE 9.84* 8.85*  CALCIUM 9.0 8.6*  GFRNONAA 5* 6*  ANIONGAP 11 13    Recent Labs  Lab 03/22/23 1235 03/24/23 0925  ALBUMIN 2.2* 2.0*   Lipids No results for input(s): "CHOL", "TRIG", "HDL", "LABVLDL", "LDLCALC", "CHOLHDL" in the last 168  hours.  Hematology Recent Labs  Lab 03/22/23 1235 03/24/23 0925  WBC 5.0 4.3  RBC 3.04* 2.89*  HGB 8.8* 8.1*  HCT 29.7* 28.1*  MCV 97.7 97.2  MCH 28.9 28.0  MCHC 29.6* 28.8*  RDW 20.4* 20.2*  PLT 192 186   Thyroid No results for input(s): "TSH", "FREET4" in the last 168 hours.  BNPNo results for input(s): "BNP", "PROBNP" in the last 168 hours.  DDimer No results for input(s): "DDIMER" in the last 168 hours.   Radiology/Studies:  No results found.   Assessment and Plan:   Baseline first degree AVB Intermittent second degree type II AVB  Patient with baseline first degree AVB seen with transient mobitz I with 2:1 conduction following CABG in 2023. He was asymptomatic and had no indication for pacing. Patient currently admitted to inpatient rehab and was noted to have bradycardia today with ECG showing similar Mobitz I with 2:1 conduction.   Per patient, no symptoms including dizziness/lightheadedness, unusual fatigue with exertion despite intermittent low heart rate.  No indication for intervention at this time and reassuring that patient's current Mobitz type I appears nearly identical to that noted at the end of last year.  No apparent progression of conduction disease. Avoid AV nodal agents.  Use caution with Geodon dosing, this can be associated with bradycardia With his co-morbidities including ESRD on HD, he is a poor transvenous pacing candidate. Would need to consider leadless if he needs pacing in the future.  Discussed with patient signs and symptoms of progressing bradycardia.  CAD s/p CABG  S/p CABG x2 following severe in-stent restenosis of RCA November 2023, currently remains on DAPT.  Following bypass, DAPT is typically recommended 6 to 12 months.  Given persistently low hemoglobin, favor discontinuing DAPT and transitioning to daily aspirin  81 mg only. Continue daily atorvastatin 80 mg  Paroxysmal atrial fibrillation (reported)  Patient with report in chart  of paroxysmal atrial fibrillation while admitted [facility]. He is not on telemetry and subsequent ECGs do not reveal any recurrence.   No evidence of atrial fibrillation per my review of patient's chart.  With patient's chronic anemia and agitation/recent falls, appears that risk of OAC>benefits at this time.  Recommend discontinuation of OAC given lack of evidence of recurrent atrial fibrillation.   Chronic HFmrEF  TTE this admission with LVEF 40 to 45%, akinesis of anterior/anteroseptal/inferoseptal wall.  On my personal review, overall stable with prior study from 2023.  Continue volume management via HD.  GDMT severely limited by patient's ESRD status as well as baseline bradycardia.  Risk Assessment/Risk Scores:        New York Heart Association (NYHA) Functional Class NYHA Class II    For questions or updates, please contact [facility] Please consult [email or web address] for contact info under    Signed, Perlie Gold, PA-C  03/28/2023 2:50 PM   Attending Note:   The patient was seen and examined.  Agree with assessment and plan as noted above.  Changes made to the above note as needed.  Patient seen and independently examined with Perlie Gold, PA .   We discussed all aspects of the encounter. I agree with the assessment and plan as stated above.   Mobitz I AV block :   he has a hx of this .  This is benign.  I would avoid AV nodal blocking agents to prevent this from worsening   2.   PAF :   there is a report of some PA[facility] but there is no evidence of Afib on any of the ECGs at [facility].   There is a possibility that his mobitz I ( with its inherant HR irregularity) could have been interpreted as Afib .   Has baseline anemia.  No concrete evidence of Afib  The risks of ongoing OAC therapy outweigh an benefits currently .   I would recommend  stopping  the Eliquis   3.   CAD   has a hx of PCI and has been on DAPT since Nov. 2023.   The prox RCA stent has been bypassed   I would DC plavix and continue ASA 81 mg a day   I have spent a total of 40 minutes with patient reviewing hospital  notes , telemetry, EKGs, labs and examining patient as well as establishing an assessment and plan that was discussed with the patient.  > 50% of time was spent in direct patient care.  [facility] will sign off. Call for further questions   Alvia Grove., MD, [facility] 03/28/2023, 4:56 PM [address]  [address] Office [phone] Pager [phone]

## 2023-03-28 NOTE — Progress Notes (Signed)
[facility] Progress Note   Subjective:   Per notes, pt was confused and resistant to HD last night. Sleeping at time of exam, moving spontaneously but not answering ROS questions.   Objective Vitals:   03/27/23 2353 03/28/23 0015 03/28/23 0104 03/28/23 0343  BP: 116/62 130/65 131/63 137/70  Pulse: 63 77 72 (!) 53  Resp: (!) 29 17 18 18   Temp: 98.6 F (37 C)  97.9 F (36.6 C)   TempSrc: Oral  Oral   SpO2: 100%  90% 93%  Weight:      Height:       Physical Exam General: Sleeping male, appears comfortable Heart: RRR, no murmurs, rubs or gallops Lungs: CTA bilaterally Abdomen: Soft, +BS Extremities: No edema b/l lower extremities Dialysis Access: LUE AVF + bruit  Additional Objective Labs: Basic Metabolic Panel: Recent Labs  Lab 03/22/23 1235 03/24/23 0925  NA 135 136  K 4.6 4.2  CL 96* 99  CO2 28 24  GLUCOSE 139* 118*  BUN 43* 33*  CREATININE 9.84* 8.85*  CALCIUM 9.0 8.6*  PHOS 4.3 3.1   Liver Function Tests: Recent Labs  Lab 03/22/23 1235 03/24/23 0925  ALBUMIN 2.2* 2.0*   No results for input(s): "LIPASE", "AMYLASE" in the last 168 hours. CBC: Recent Labs  Lab 03/22/23 1235 03/24/23 0925  WBC 5.0 4.3  HGB 8.8* 8.1*  HCT 29.7* 28.1*  MCV 97.7 97.2  PLT 192 186   Blood Culture    Component Value Date/Time   SDES BLOOD RIGHT HAND 02/19/2023 1855   SPECREQUEST  02/19/2023 1855    BOTTLES DRAWN AEROBIC ONLY Blood Culture results may not be optimal due to an inadequate volume of blood received in culture bottles   CULT  02/19/2023 1855    NO GROWTH 5 DAYS Performed at [facility], [address], [place], [place] [address]    REPTSTATUS 02/24/2023 FINAL 02/19/2023 1855    Cardiac Enzymes: No results for input(s): "CKTOTAL", "CKMB", "CKMBINDEX", "TROPONINI" in the last 168 hours. CBG: Recent Labs  Lab 03/25/23 1728 03/26/23 0605 03/26/23 1134 03/26/23 1641 03/26/23 2009  GLUCAP 110* 98 102* 113* 95   Iron  Studies: No results for input(s): "IRON", "TIBC", "TRANSFERRIN", "FERRITIN" in the last 72 hours. @lablastinr3 @ Studies/Results: No results found. Medications:   (feeding supplement) PROSource Plus  30 mL Oral BID BM   aspirin EC  81 mg Oral Daily   atorvastatin  80 mg Oral Daily   Chlorhexidine Gluconate Cloth  6 each Topical Q0600   clopidogrel  75 mg Oral Daily   darbepoetin (ARANESP) injection - DIALYSIS  150 mcg Subcutaneous Q Sun-1800   diphenhydrAMINE  25 mg Intravenous Once   divalproex  125 mg Oral Q8H   enoxaparin (LOVENOX) injection  30 mg Subcutaneous Q24H   feeding supplement  237 mL Oral BID WC   Gerhardt's butt cream  1 Application Topical QID   lidocaine  1 patch Transdermal Q2200   lidocaine  1 patch Transdermal Q24H   magnesium oxide  400 mg Oral Daily   midodrine  5 mg Oral TID WC   mometasone-formoterol  2 puff Inhalation BID   pantoprazole  40 mg Oral BID   sevelamer carbonate  800 mg Oral TID WC   traZODone  50 mg Oral QHS   ziprasidone  20 mg Oral Daily   ziprasidone  40 mg Oral Daily    Dialysis Orders: Previously on CCPD thru [facility] - changed to HD -  will need outpatient HD unit established prior to d/c.   Assessment/Plan: 1. Debility - in CIR, estimated discharge 9/13 2. Encephalopathy/agitation: Patient was having issues with agitation previously. Unclear of etiology. Ammonia fine and brain MRI with no acute findings. Currently, his behavior is improving but waxes/wanes. 3. ESRD:  Transitioned from PD to HD recently on TTS schedule. Using old AVF which is still functional, s/p f'gram 02/28/23.  Might req new CLIP if going to SNF 4. Hypertension/volume: BP soft with some hypotension on HD. No respiratory issues.  Working to establish dry weight. Reinforced fluid restrictions. Tolerating UFG 2L with next HD.  5. Anemia: Tsat 38% with ferritin 2175.  No iron. Increased aranesp to for 9/1. Hgb now 8.1.  6. Metabolic bone disease: CorrCa  and phos in goal.  On Renvela 1 AC TID. 7.  Nutrition:  Alb very low, continue supps.  Renal diet w/fluid restrictions.  8.  Hx NSVT: On Coreg 10.  Recent PD related peritonitis (+TNC >2000, cx's neg) and related +fungemia/ enterococcus bacteremia - underwent PD cath removal, TDC placement and HD cath placement and line holiday. Completed IV unasyn, IV micafungin and cipro through 8/15. TDC not replaced cause he had functional AVF.   11. Dispo: Patient's behavior is improving. He is still requiring enclosure bed. Will need to be able to calmly sit through HD for a full outpatient treatment without trying to stand for his own safety. It does seem like we are moving in the right direction but was agitated at HD yesterday. I think he would benefit from a sitter at outpatient HD, will discuss with renal navigator     Rogers Blocker, PA-C 03/28/2023, 10:39 AM  [facility] Pager: [phone]

## 2023-03-28 NOTE — Progress Notes (Addendum)
Physical Therapy Session Note  Patient Details  Name: [name] [name] MRN: [id number] Date of Birth: 06-28-1951  Today's Date: 03/28/2023 PT Individual Time: 0900-0945 PT Individual Time Calculation (min): 45 min  Today's Date: 03/28/2023 PT Missed Time: 15 Minutes Missed Time Reason: Other (Comment) (Showering with NT present)  Short Term Goals: Week 2:  PT Short Term Goal 1 (Week 2): STGs = LTGs  Skilled Therapeutic Interventions/Progress Updates: Patient in shower with NT present on entrance to room. PTA came back at indicated time to give pt time to shower and get ready. After return, Patient alert and agreeable to PT session.   Patient reported no pain at beginning of PT session.   Pt presented with increased breathing and required rest break at end of of last NMRE intervention (reported slight SOB). HR and SpO2 checked with O2 saturation above 95%; however, HR jumped around (below 90) until remaining consistent in mid 30's. PTA performed manual HR on R radial artery with pt noting to have some off beats (RN notified, and message sent to attending physician). Pt transported back to room at that point for pt safety (pt reported increase in lethargy).  Therapeutic Activity: Bed Mobility: Pt performed sit to R sidelying from EOB with supervision.  Transfers: Pt performed sit<>stand transfers throughout session with supervision and no AD. Pt dependently transported to day room gym for energy conservation.   Gait Training:  Pt ambulated around day room and nsg loop using R HHA. Pt presented with step through pattern and no LOB at first. Towards end of loop, pt looked to the R to talk to PTA and had increase in LOB. Pt then cued to look to the R and L when cued with pt noted increase in LOB to the R that required min/modA to remain upright and to take the few steps back to [facility] to sit.   Neuromuscular Re-ed: NMR facilitated during session with focus on dual tasking and dynamic standing  balance. - Pt ambulated around day room with chairs set around for safety. Pt with R HHA and instructions to turn head in various directions called out by PTA. Pt with CGA throughout and with slight unsteadiness that required light minA to maintain upright. Progressed to pt counting in 2's while performing same indicated cues. Pt would get up to high 20's, get distracted, and attempt to resume after PTA cued pt to but pt started from 4 after saying "I remember where I left off."  NMR performed for improvements in motor control and coordination, balance, sequencing, judgement, and self confidence/ efficacy in performing all aspects of mobility at highest level of independence.   Patient in bed enclosure at end of session with brakes locked, and all needs within reach.      Therapy Documentation Precautions:  Precautions Precautions: Fall Restrictions Weight Bearing Restrictions: No  Therapy/Group: Individual Therapy  Mykal Batiz PTA 03/28/2023, 12:21 PM

## 2023-03-28 NOTE — Progress Notes (Signed)
   03/28/23 1737  What Happened  Was fall witnessed? Yes  Who witnessed fall? Telesitter, Wife  Patients activity before fall ambulating-unassisted  Point of contact buttocks  Was patient injured? No  Patient found other (Comment) (back in bed)  Found by Staff-comment  Stated prior activity to/from bed, chair, or stretcher  Provider Notification  Provider Name/Title Delle Reining PA  Date Provider Notified 03/28/23  Time Provider Notified 1740  Method of Notification Call  Follow Up  Family notified Yes - comment  Time family notified  (1740)  Additional tests No  Adult Fall Risk Assessment  Risk Factor Category (scoring not indicated) Fall has occurred during this admission (document High fall risk)  Age 72  Fall History: Fall within 6 months prior to admission 5  Elimination; Bowel and/or Urine Incontinence 0  Elimination; Bowel and/or Urine Urgency/Frequency 0  Medications: includes PCA/Opiates, Anti-convulsants, Anti-hypertensives, Diuretics, Hypnotics, Laxatives, Sedatives, and Psychotropics 3  Patient Care Equipment 0  Mobility-Assistance 2  Mobility-Gait 2  Mobility-Sensory Deficit 2  Altered awareness of immediate physical environment 1  Impulsiveness 2  Lack of understanding of one's physical/cognitive limitations 4  Total Score 23  Patient Fall Risk Level High fall risk  Adult Fall Risk Interventions  Required Bundle Interventions *See Row Information* High fall risk - low, moderate, and high requirements implemented  Additional Interventions Use of appropriate toileting equipment (bedpan, BSC, etc.);Safety Sitter/Safety Rounder  Fall intervention(s) refused/Patient educated regarding refusal Bed alarm;Nonskid socks  Screening for Fall Injury Risk (To be completed on HIGH fall risk patients) - Assessing Need for Floor Mats  Risk For Fall Injury- Criteria for Floor Mats TeleSitter camera in use  Will Implement Floor Mats Yes  Vitals  Temp (!) 97.5 F (36.4 C)  BP  128/88  MAP (mmHg) 98  Pulse Rate 67  Resp 18  Oxygen Therapy  SpO2 97 %  O2 Device Room Air  Patient Activity (if Appropriate) In bed  Pain Assessment  Pain Scale 0-10  Pain Score 0  PCA/Epidural/Spinal Assessment  Respiratory Pattern Regular  Neurological  Neuro (WDL) X  Level of Consciousness Alert  Orientation Level Oriented to person  Cognition Follows commands  Speech Clear  Musculoskeletal  Musculoskeletal (WDL) X  Assistive Device Front wheel walker;Wheelchair  Generalized Weakness Yes  Weight Bearing Restrictions No  Integumentary  Integumentary (WDL) WDL  Skin Color Appropriate for ethnicity  Skin Condition Dry  Skin Turgor Non-tenting

## 2023-03-28 NOTE — Progress Notes (Signed)
   03/28/23 0015  Vitals  BP 130/65  MAP (mmHg) 76  BP Location Right Arm  BP Method Automatic  Patient Position (if appropriate) Lying  Pulse Rate 77  ECG Heart Rate 75  Resp 17  During Treatment Monitoring  Blood Flow Rate (mL/min) 0 mL/min  Arterial Pressure (mmHg) -0.4 mmHg  Venous Pressure (mmHg) -1.01 mmHg  TMP (mmHg) -50.91 mmHg  Ultrafiltration Rate (mL/min) 765 mL/min  Dialysate Flow Rate (mL/min) 299 ml/min  Dialysate Potassium Concentration 3  Dialysate Calcium Concentration 2.5  Duration of HD Treatment -hour(s) 3.08 hour(s)  Cumulative Fluid Removed (mL) per Treatment  1681.39  Post Treatment  Dialyzer Clearance Clear  Liters Processed 78  Fluid Removed (mL) 1700 mL  Tolerated HD Treatment Yes  Post-Hemodialysis Comments  (Constantly resisting treatment, Pt confuse)  AVG/AVF Arterial Site Held (minutes) 10 minutes  AVG/AVF Venous Site Held (minutes) 10 minutes  Fistula / Graft Left Other (Comment) Arteriovenous fistula  Placement Date/Time: 08/09/16 1327   Placed prior to admission: No  Orientation: Left  Access Location: (c) Other (Comment)  Access Type: Arteriovenous fistula  Site Condition No complications  Fistula / Graft Assessment Present;Thrill;Bruit  Status Deaccessed  Needle Size 15  Drainage Description None   Hand off to Primary RN.

## 2023-03-28 NOTE — Progress Notes (Signed)
This nurse was notified by EKG tech that patient had a critical result. This nurse informed MD of result . No new orders at this time. Will cont to monitor.    Tori Milks LPN

## 2023-03-28 NOTE — Progress Notes (Signed)
SLP Cancellation Note  Patient Details Name: [name] [name] MRN: [id number] DOB: 04-Dec-1950  Cancelled treatment:        SLP attempted to facilitate therapy session targeting cognitive retraining goals. SLP entered room, receiving patient in the veil bed, asleep. Patient difficulty to rouse but eventually awoke with auditory/verbal cues. SLP requested patient wake to participate with therapy services with patient initially agreeable, though patient soon returned to sleeping state. Upon reattempt to rouse patient, patient indicated that he did not want to participate in speech therapy at this time. SLP will continue attempts as able to facilitate POC indicated for CIR admission.                                                                                            [name], M.A., CCC-SLP  [name] 03/28/2023, 10:49 AM

## 2023-03-28 NOTE — Progress Notes (Addendum)
PROGRESS NOTE   Subjective/Complaints:  Working with therapy this AM. Had EKG for bradycardia today.   ROS: Limited due to cognitive/behavioral   Objective:   No results found. No results for input(s): "WBC", "HGB", "HCT", "PLT" in the last 72 hours.   No results for input(s): "NA", "K", "CL", "CO2", "GLUCOSE", "BUN", "CREATININE", "CALCIUM" in the last 72 hours.    Intake/Output Summary (Last 24 hours) at 03/28/2023 1059 Last data filed at 03/28/2023 0840 Gross per 24 hour  Intake 580 ml  Output 1700 ml  Net -1120 ml         Physical Exam: Vital Signs Blood pressure 137/70, pulse (!) 53, temperature 97.9 F (36.6 C), temperature source Oral, resp. rate 18, height 5\' 4"  (1.626 m), weight 97.4 kg, SpO2 93%.  Constitutional: No distress . Vital signs reviewed.  Working with therapy HEENT: NCAT, EOMI, oral membranes moist Neck: supple Cardiovascular: RRR without murmur. No JVD    Respiratory/Chest: CTA Bilaterally without wheezes or rales. Normal effort    GI/Abdomen: BS +, non-tender, non-distended Ext: no clubbing, cyanosis, or edema Psych: pleasant but confused  Neurologic Exam:   Alert. Follows simple commands, Poor awareness and insight Oriented to self, hospital + Memory deficit - ongoing + Poor insight + Mild L ptosis   Sensory exam: Intact to touch in all 4 extremities Motor exam: moves all 4 extremities without limitiations Coordination: Slight bilateral upper extremity tremor, intention, mild.    Musculoskeletal: No joint swelling noted No abnormal tone      Assessment/Plan: 1. Functional deficits which require 15 hr per week of interdisciplinary therapy in a comprehensive inpatient rehab setting. Physiatrist is providing close team supervision and 24 hour management of active medical problems listed below. Physiatrist and rehab team continue to assess barriers to discharge/monitor patient  progress toward functional and medical goals  Care Tool:  Bathing    Body parts bathed by patient: Right arm, Left arm, Chest, Abdomen, Front perineal area, Right upper leg, Buttocks, Left upper leg, Right lower leg, Left lower leg, Face         Bathing assist Assist Level: Moderate Assistance - Patient 50 - 74%     Upper Body Dressing/Undressing Upper body dressing   What is the patient wearing?: Pull over shirt    Upper body assist Assist Level: Minimal Assistance - Patient > 75%    Lower Body Dressing/Undressing      Lower body dressing      What is the patient wearing?: Pants     Lower body assist Assist for lower body dressing: Moderate Assistance - Patient 50 - 74%     Toileting Toileting    Toileting assist Assist for toileting: Moderate Assistance - Patient 50 - 74%     Transfers Chair/bed transfer  Transfers assist     Chair/bed transfer assist level: Contact Guard/Touching assist     Locomotion Ambulation   Ambulation assist      Assist level: Minimal Assistance - Patient > 75% Assistive device: No Device Max distance: 175'   Walk 10 feet activity   Assist     Assist level: Contact Guard/Touching assist Assistive device: No Device  Walk 50 feet activity   Assist Walk 50 feet with 2 turns activity did not occur: Safety/medical concerns  Assist level: Minimal Assistance - Patient > 75% Assistive device: No Device    Walk 150 feet activity   Assist Walk 150 feet activity did not occur: Safety/medical concerns  Assist level: Minimal Assistance - Patient > 75% Assistive device: No Device    Walk 10 feet on uneven surface  activity   Assist Walk 10 feet on uneven surfaces activity did not occur: Safety/medical concerns         Wheelchair     Assist Is the patient using a wheelchair?: Yes Type of Wheelchair: Manual    Wheelchair assist level: Dependent - Patient 0%      Wheelchair 50 feet with 2 turns  activity    Assist        Assist Level: Dependent - Patient 0%   Wheelchair 150 feet activity     Assist      Assist Level: Dependent - Patient 0%   Blood pressure 137/70, pulse (!) 53, temperature 97.9 F (36.6 C), temperature source Oral, resp. rate 18, height 5\' 4"  (1.626 m), weight 97.4 kg, SpO2 93%.  Medical Problem List and Plan: 1. Functional deficits secondary to metabolic encephalopathy due to sepsis secondary to catheter peritonitis and hypoxemia              -patient may not shower             -ELOS/Goals: 16-18 days, PT/OT Sup, SLP min A -tentative discharge date 9-9           -Continue CIR therapies including PT, OT, and SLP   -Will review records from [facility] -- has worsened since approximately 8/22, possible 8/19, consistent with records from nursing at [facility] with current behavior.    - continue enclosure bed for safety  -  therapy 15/7  -Looking into SNF discharge   2.  Antithrombotics: -DVT/anticoagulation:  Pharmaceutical: Heparin, 03/23/23 changed to Lovenox yesterday to help improved compliance             -antiplatelet therapy: aspirin and Plavix   3. Pain Management: Tylenol as needed             -Lidoderm patches to back  -Elavil 25 mg at bedtime for nerve pain and sleep 8/27  -8/29: Pain well controlled on Elavil; DC lyrica, PRN Tylenol/oxy 2.5-5 mg d/t CKD  8-30: Appears patient has been getting pain medications together overnight along with antipsychotics, will discuss with nursing  8-31: Patient having an difficulty endorsing needs and not utilizing PRNs, scheduled oxycodone 5 mg 3 times daily and has 2.5 mg prn 3 times daily available.   9-1: Not utilizing PRNs, minimal complaints of pain, will discontinue scheduled given lethargy today..  -muscle rub R shoulder  9/10-1 no reports of pain this morning   4. Mood/Behavior/Sleep: LCSW to evaluate and provide emotional support             -continue Seroquel 50 mg TID              -clonazepam 0.5 mg TID prn             -Geodon 10 mg IM q 12 hours prn             -antipsychotic agents: n/a  -8-27: Agitated by soft restraints in bed, transition to 4 side rails, Posey alarm, and TeleSitter for now.  Veil bed on standby if needed,  however feel we should avoid this if possible due to likelihood of agitating patient further   - 8/28: Placed in veil bed; wrist restraints for dialysis. Scheduled Geodon 20 mg twice daily; discontinue Seroquel and clonazepam due to no perceivable benefit. Add as needed Zyprexa 5 mg twice daily p.o. or IM due to recent cardiac arrhythmias and concern for QTc prolongation. As needed ramelteon for sleep. Stop gabapentin due to possible side effect of agitation  - 8/28: No improvement except temporarily with Geodon; add depakote 125 mg q8h for ongoing aggitation, mood stabilization. Increase elavil to 50 mg at bedtime.  Workup ongoing.    - 8/29: EEG negative, labs normal. MRI pending. Sleeping well and calm since starting depakote and increase in elavil. Will monitor tonight and wean Geodon in AM if appropriate.   8-30: MRI showing chronic microvascular changes, otherwise normal.  Increasingly calm and appropriate during the day, with ongoing agitation at nighttime.  Will increase nighttime Geodon to 40 mg; will discuss sleep log with nursing, as it has not been performed.  8-31 no sleep overnight per sleep log, again getting agitated around 9 PM and 5 AM.  Not responsive to as needed Zyprexa, responsive to Ativan.  Attempted to wean off scheduled a.m. Geodon today, but patient became extremely agitated and unable to redirect, calm down with readministration of medication.  Resume Geodon 20 mg every morning/40 mg every afternoon, increased Elavil to 75 mg nightly, adding trazodone 50 mg nightly as needed.  P.o./IV Ativan 0.5 mg every 6 hours for agitation.  9-1: slept well overnight with trazodone and increased Elavil, no PRNs used.  Unfortunately, oversedated  today.  Discontinue scheduled pain medications as above, start wean of Elavil to 50 mg x2 days -> 25 mg x2 days -> 10 mg x2 days, off due to no apparent benefit and no complaints of pain.  Reduce Geodon from 20 mg every morning/40 mg every evening to 20 mg twice daily.  Continue trazodone as needed for sleep, as this seems to be the difference for a restful night yesterday.  If he remains overly sedated, would dose reduce and DC Depakote next.  Keep Ativan as needed d/t dialysis not having other options available.  9/2-3--non agitated but still sleepy   -dc'ed depakote as I doubt it's doing much at this dose anyway   -continue geodon at 20mg  bid   -scheduled trazodone at HS with back up dose if needed    -sleep was intermittent last night   -requires enclosure bed for safety  9/4 continue enclosure bed for now, consider trying DC of this device early next week  -9/5 appears to be doing better overall this morning, continues to have intermittent agitation at times.  Continue enclosure bed  -9/6 agitation appears to be improving, patient is skeptical about medications and has been declining some of these.  Will discontinue multivitamin, RisaQuad to try to decrease pill burden  -9/7 continues to have intermittent episodes of agitation however overall improving, appears to be taking most of his medications  9/9 agitation and mental status appears to be improving -discussing discontinuation of enclosure bed with team 9/10 Geodon dose increased to 40 mg at night, continue 20 mg in the morning 9/11 No acute events noted, will check with team regarding DC enclosure bed, nursing reports he did ok last night but had dialysis 5. Neuropsych/cognition: This patient is not capable of making decisions on his own behalf   -recommend neuropsych consult   - 8/27: Ongoing  aggitation; Consult hospitalist for further workup on potential contributors to encephalopathy. Pending UA, LA 2.1-> 1.4; otherwise negative so  far -Delirium precautions, sleep log, Veiled bed for safety and to minimize restraints -Wife has expressed that she is not interested in palliative care at this time; hopefully with above measures patient can get dialysis in the next 48 hours to limit worsening uremic encephalopathy and medical decline    - 8/29: Aggitation much improved, able to get some dialysis last night and today. See above for medication changes. Calling wife tonight to discuss.   8-30: Still with moderate cognitive deficits, however appears once patient is less agitated he is better able to concentrate and has better orientation.  MRI negative for acute findings, monitor.  9/3 agitation better. More sedated than anything at this point. Confused as well.    -depakote stopped   -continue other meds as rx'ed  9/9 seems to be fairly alert and calm today 6. Skin/Wound Care: Routine skin care checks             -sacral skin off-loading/protection   7. Fluids/Electrolytes/Nutrition: Strict Is and Os and follow-up chemistries per nephrology             -renal diet/ 1200 cc FR  - Renvela 800 mg 3 times daily for hyperphosphatemia added per nephrology 8-27   8: Hypotension: monitor TID and prn             -continue midodrine as needed during dialysis  Normotensive, monitor  9-1: Consistently 90s over 60s, has been stable but given findings of microvascular changes on MRI, may be too low for patient to perfuse properly.  Will add midodrine 5 mg tid and monitor.  DC Coreg as heart rate has been low to normal; may resume at low-dose once blood pressure improved.   9/2 continue with ccurrent regimen. Bp's hr remain low   -elavil wean should help  9/9-10 BP stable, continue midodrine and monitor  9/11 BP stable, continue current regimen     03/28/2023    3:43 AM 03/28/2023    1:04 AM 03/28/2023   12:15 AM  Vitals with BMI  Systolic 137 131 308  Diastolic 70 63 65  Pulse 53 72 77     9: Hyperlipidemia: continue statin   10:  CAD s/p CABG 2023: on DAPT and statin.  - troponin elevated but stable, monitor 8/27   11: DM-2: A1c = 6.3% (at home on Lantus 22 units at bedtime, Tradjenta 5 mg daily)        -monitor PO intake             -continue SSI (0-6 units) does not appear to be requiring coverage   - 9/6 DC SSI, change CBGs to daily  9/8 discontinue CBGs   Recent Labs    03/26/23 1134 03/26/23 1641 03/26/23 2009  GLUCAP 102* 113* 95     12: ESRD: HD on Tues, Thurs, Sat -nephrology made aware   - Unable to tolerate 8/27 d/t aggitation, stopped early 8/28;  - attempting again 8/29, stopped early again -Per nephrology, redirectable with gum -Have to switch restraints during dialysis sessions from veil bed to regular bed, then resume veil bed order once at rehab -9/9 tolerating HD better   13: COPD/OSA; chronic home 2L O2 (Symbicort, Ventolin, prednisone 40 mg daily at home)             -continue symbicort  -Added home oxygen back 8-27   14: GERD:  continue Protonix   15: Non-sustained VT/pAF: per cardiology, Dr. Algie Coffer: -continue carvedilol 6.25 mg BID; consider increasing to 12.5 mg BID before starting low dose amiodarone  -consider Eliquis and discontinue asa and Plavix in one week if no active GI bleeding and stable hemoglobin from 8/24 -hemoglobin has remained stable but very low around 7, no recurrent A-fib, monitor for now given agitation/fall 8-31 and consider initiating Eliquis when safe   -Continue mag-ox to avoid hypomagnesemia -8-27: Single instance of bradycardia this morning 40s, may have been missed read, monitor through today and do not further adjust beta-blocker medications  -9-3 hr controlled after holding coreg -9/4-5 HR stable, continue to monitor -9/9 consider Eliquis this week if agitation continues to improve -9/10 will hold off on Eliquis due to sundowning at night, will see how he does with increase Geodon -9/11 EKG for bradycardia episode today, 2nd degree A-V block mobitz  type 1.  Continue to hold beta blocker,9/11 will restart eliquis at 2.5 mg BID dose. Will consult cardiology regarding need for ASA/plavix and coreg.   16: HFrEF: Daily weights. Monitor for signs of overload  - 9/7 continue daily weights, fluid level management per hemodialysis team  Filed Weights   03/22/23 1808 03/24/23 0848 03/24/23 1151  Weight: 100.3 kg 98.7 kg 97.4 kg      17: Liver nodularity on CT scan: LFTs reported to be normal. Avoid hepatotoxic medications    -Abdominal ultrasound earlier this admission with minimal ascites; ammonia within normal limits  -8/31: Did discuss with nephrology whether p.o. lactulose would be beneficial despite normal ammonia, after reviewing records unsure that this would be beneficial    18: Epistaxis - resolved             -previously improved with flonase and saline spray   19: Morbid Obesity. Dietary counseling Body mass index is 39.01 kg/m.      LOS: 16 days A FACE TO FACE EVALUATION WAS PERFORMED  Fanny Dance 03/28/2023, 10:59 AM

## 2023-03-28 NOTE — Progress Notes (Signed)
Occupational Therapy Session Note  Patient Details  Name: [name] [name] MRN: [id number] Date of Birth: Aug 01, 1950  Today's Date: 03/28/2023 OT Individual Time: 1430-1530 OT Individual Time Calculation (min): 60 min    Short Term Goals: Week 3:  OT Short Term Goal 1 (Week 3): LTG=STG 2/2 ELOS  Skilled Therapeutic Interventions/Progress Updates:   Pt seen for skilled OT session. No episodes of agitation this session. Wife present bedside and reported pt needed to use restroom upon OT arrival. Pt amb from w/c with HHA and min cues for safety and was able to manage pull on pants and peri hygiene with close s and min cues. Amb to sink side and performed hand washing with CGA in standing. Pt transported to demo apartment and OT placed w/c just inside door. Pt amb with HHA to kitchen table and sat for simple cognitive sequencing activity with mod fading to min cues. Standing reaching task at pantry for simple items with excursions ~ 4-5" unilaterally with occ min A for dynamic reaching and balance. Seated rest with 3 intervals of 2 min. Once back in room, OT left pt w/c level with wife present and chair alarm set, needs and nurse call button in place as well as telesitter support.   Pain: none reported  Therapy Documentation Precautions:  Precautions Precautions: Fall Restrictions Weight Bearing Restrictions: No   Therapy/Group: Individual Therapy  Vicenta Dunning 03/28/2023, 5:02 PM

## 2023-03-29 DIAGNOSIS — R5381 Other malaise: Secondary | ICD-10-CM | POA: Diagnosis not present

## 2023-03-29 DIAGNOSIS — R4182 Altered mental status, unspecified: Secondary | ICD-10-CM | POA: Diagnosis not present

## 2023-03-29 DIAGNOSIS — N186 End stage renal disease: Secondary | ICD-10-CM | POA: Diagnosis not present

## 2023-03-29 DIAGNOSIS — Z992 Dependence on renal dialysis: Secondary | ICD-10-CM | POA: Diagnosis not present

## 2023-03-29 DIAGNOSIS — D631 Anemia in chronic kidney disease: Secondary | ICD-10-CM | POA: Diagnosis not present

## 2023-03-29 DIAGNOSIS — I120 Hypertensive chronic kidney disease with stage 5 chronic kidney disease or end stage renal disease: Secondary | ICD-10-CM | POA: Diagnosis not present

## 2023-03-29 DIAGNOSIS — N250 Renal osteodystrophy: Secondary | ICD-10-CM | POA: Diagnosis not present

## 2023-03-29 LAB — RENAL FUNCTION PANEL
Albumin: 2.1 g/dL — ABNORMAL LOW (ref 3.5–5.0)
Anion gap: 16 — ABNORMAL HIGH (ref 5–15)
BUN: 57 mg/dL — ABNORMAL HIGH (ref 8–23)
CO2: 24 mmol/L (ref 22–32)
Calcium: 9 mg/dL (ref 8.9–10.3)
Chloride: 97 mmol/L — ABNORMAL LOW (ref 98–111)
Creatinine, Ser: 10.88 mg/dL — ABNORMAL HIGH (ref 0.61–1.24)
GFR, Estimated: 5 mL/min — ABNORMAL LOW (ref >60)
Glucose, Bld: 107 mg/dL — ABNORMAL HIGH (ref 70–99)
Phosphorus: 2.6 mg/dL (ref 2.5–4.6)
Potassium: 4.2 mmol/L (ref 3.5–5.1)
Sodium: 137 mmol/L (ref 135–145)

## 2023-03-29 LAB — CBC
HCT: 30.2 % — ABNORMAL LOW (ref 39.0–52.0)
Hemoglobin: 8.6 g/dL — ABNORMAL LOW (ref 13.0–17.0)
MCH: 27.7 pg (ref 26.0–34.0)
MCHC: 28.5 g/dL — ABNORMAL LOW (ref 30.0–36.0)
MCV: 97.1 fL (ref 80.0–100.0)
Platelets: 152 10*3/uL (ref 150–400)
RBC: 3.11 MIL/uL — ABNORMAL LOW (ref 4.22–5.81)
RDW: 18.4 % — ABNORMAL HIGH (ref 11.5–15.5)
WBC: 4.5 10*3/uL (ref 4.0–10.5)
nRBC: 0 % (ref 0.0–0.2)

## 2023-03-29 MED ORDER — ALTEPLASE 2 MG IJ SOLR: 2.0000 mg | Freq: Once | INTRAMUSCULAR | Status: DC | PRN

## 2023-03-29 MED ORDER — LIDOCAINE-PRILOCAINE 2.5-2.5 % EX CREA
1.0000 | TOPICAL_CREAM | CUTANEOUS | Status: DC | PRN
  Filled 2023-03-29: qty 5

## 2023-03-29 MED ORDER — HEPARIN SODIUM (PORCINE) 1000 UNIT/ML DIALYSIS
1000.0000 [IU] | INTRAMUSCULAR | Status: DC | PRN
  Filled 2023-03-29: qty 1

## 2023-03-29 MED ORDER — HEPARIN SODIUM (PORCINE) 1000 UNIT/ML DIALYSIS: 1000.0000 [IU] | INTRAMUSCULAR | Status: DC | PRN

## 2023-03-29 MED ORDER — HEPARIN SODIUM (PORCINE) 1000 UNIT/ML DIALYSIS: 2000.0000 [IU] | Freq: Once | INTRAMUSCULAR | Status: DC

## 2023-03-29 MED ORDER — LIDOCAINE HCL (PF) 1 % IJ SOLN: 5.0000 mL | INTRAMUSCULAR | Status: DC | PRN

## 2023-03-29 MED ORDER — PENTAFLUOROPROP-TETRAFLUOROETH EX AERO: 1.0000 | INHALATION_SPRAY | CUTANEOUS | Status: DC | PRN

## 2023-03-29 MED ORDER — ANTICOAGULANT SODIUM CITRATE 4% (200MG/5ML) IV SOLN
5.0000 mL | Status: DC | PRN
  Filled 2023-03-29: qty 5

## 2023-03-29 MED ORDER — LIDOCAINE-PRILOCAINE 2.5-2.5 % EX CREA: 1.0000 | TOPICAL_CREAM | CUTANEOUS | Status: DC | PRN

## 2023-03-29 MED ORDER — HEPARIN SODIUM (PORCINE) 1000 UNIT/ML DIALYSIS
2000.0000 [IU] | Freq: Once | INTRAMUSCULAR | Status: DC
  Filled 2023-03-29: qty 2

## 2023-03-29 MED ORDER — ANTICOAGULANT SODIUM CITRATE 4% (200MG/5ML) IV SOLN: 5.0000 mL | Status: DC | PRN

## 2023-03-29 NOTE — Progress Notes (Signed)
Occupational Therapy Session Note  Patient Details  Name: [name] [name] MRN: [id number] Date of Birth: 02-23-1951  Today's Date: 03/29/2023 OT Individual Time: 1105-1150 OT Individual Time Calculation (min): 45 min  and Today's Date: 03/29/2023 OT Missed Time: 15 Minutes Missed Time Reason: Patient unwilling/refused to participate without medical reason;Patient fatigue   Short Term Goals: Week 3:  OT Short Term Goal 1 (Week 3): LTG=STG 2/2 ELOS  Skilled Therapeutic Interventions/Progress Updates:   Pt greeted seated in wc in room, rubbing his stomach. Pt reported not feeling well and that he had a stomach ache. With encouragement, pt agreeable to go to the gym. Worked on attention and UB strength/endurance with [employer] arm bike. Pt able to tolerate 5 mins on Arm bike with min cues to maintain attention at times. Pt with some shortness of breath after UB there-ex. Pt took rest break and kept rubbing his stomach saying he didn't feel well. Pt attempted to engage pt in functional activity on mat, ambulation, and standing activities, but pt refused stating he needed to lay down. Pt getting increasingly agitated as OT offers different options for participation. OT returned pt to room and assisted with min A stand-pivot back to EOB. While seated EOB, pt becoming more agitated, yelling for his mom who he says is "in the kitchen over there." OT tried to re-orient pt, but pt getting more agitated. OT able to redirect pt to changing into a clean shirt at EOB with supervision and cues for orientation of t shirt. Pt then wanted to get back up to chair which was completed with Min A. Pt refused to participate further in OT. Pt left seated in wc with alarm belt on, call bell in reach, and needs met.   Therapy Documentation Precautions:  Precautions Precautions: Fall Restrictions Weight Bearing Restrictions: No Pain: Pt reported pain in belly and feeling nauseous.   Therapy/Group: Individual  Therapy   [name] 03/29/2023, 11:29 AM

## 2023-03-29 NOTE — Plan of Care (Signed)
Behavioral Plan Guideline Note   Patient Details  Name: [name] [name] MRN: [id number] Date of Birth: Aug 04, 1950     Behavioral Plan:      Rancho Level N/A  Behavior to decrease/eliminate Decrease verbal agitation with staff; Decrease physical agitation with staff; Increase participation; Decrease impulsivity; Decrease restlessness  Decrease incontinence   Decrease pulling at lines/tubes  Tolerate Dialysis        Changes to environment Lights on, blinds open during the day, off and closed at night; Door open when staff not in room; Place signs in room to remind pt to call for assistance  Urinal at bedside Within arms reach during toileting Place signs in room to remind pt to call for assistance       Interventions Telesitter; Bed alarm setting; Therapy schedule adjustments; Mobilize on unit with staff or family (w/c level or ambulatory if appropriate) (qd)  Bed alarm belt Seat alarm belt (Pt still needs alarms even when wife is present)     Bed alarm setting Medium sensitivity (regular bed up against wall - facing window)     # of rails up 2 right upper and lower       Recommendations for interactions with patient Remain calm and reduce environment stimulation with agitation; Redirect behaviors to alternate tasks/topics; DO NOT argue with patient; Provide gentle orientation or indirect orientation; Offer mobilizing in the room, on the unit, sitting at nurses' station with restlessness: see safety plan for mobility recommendation (w/c v ambulation); Offer snacks every interaction      In Attendance at Behavior Plan Meeting OT;PT;RN        This behavior plan was developed with this patient's interdisciplinary team. It will be updated weekly on the day of conference or as needed with changing behaviors to keep the patient and staff safe. A copy of the Behavior plan will be posted in the patient room, at the RN station with the charge RN and in the patient medical  record. If you have any questions or need clarification, please feel free to discuss with the interdisciplinary team mentioned above.   [name] [facility] 03/29/2023, 1:59 PM

## 2023-03-29 NOTE — Progress Notes (Signed)
Continues to be restless throughout the night. Wanting to frequently use the bathroom. In room calling out for various family members. At times becoming disoriented with location believing can walk to his house, can see his house, that he walked to the bathroom from the second floor, and believing his Wife is in another room. In addition to, seeing family members in the room and explaining that they are moving around. When attempting to orient patient explains that they are in the room but are in another state, [place]. As the night progresses increase in irritability and non-compliance. Wanting to leave the unit but able to be redirected. Needing assistance with staff to have patient take medication as well return back to his bed.

## 2023-03-29 NOTE — Plan of Care (Addendum)
Behavioral Plan Guideline Note  Patient Details  Name: [name] [name] MRN: [id number] Date of Birth: 1951-07-08   Behavioral Plan:    Rancho Level  (no Rancho Level)  Behavior to decrease/eliminate Decrease verbal agitation with staff; Decrease physical agitation with staff; Increase participation; Decrease impulsivity; Decrease restlessness   Decrease pulling at lines/tubes  (n/a)     Decrease removal of orthoses   N/a   Improved adherence to precautions  N/a     Changes to environment Lights on, blinds open during the day, off and closed at night; Door open when staff not in room;P lace signs in room to remind pt to call for assistance     Minimize in room stimulation       Interventions Telesitter; Bed alarm setting; Therapy schedule adjustments; Mobilize on unit with staff or family (w/c level or ambulatory if appropriate) (qd)  Bed alarm belt Seat alarm belt (Pt still needs alarms even when wife is present)  Restraints       Bed alarm setting Medium sensitivity (regular bed up against wall - facing window)     # of rails up 2 right upper and lower    Therapy schedule adjustments  (qd)     Recommendations for interactions with patient Remain calm and reduce environment stimulation with agitation; Redirect behaviors to alternate tasks/topics; DO NOT argue with patient; Provide gentle orientation or indirect orientation; Offer mobilizing in the room, on the unit, sitting at nurses' station with restlessness: see safety plan for mobility recommendation (w/c v ambulation); Offer snacks every interaction    In Attendance at Behavior Plan Meeting OT;PT;RN       This behavior plan was developed with this patient's interdisciplinary team. It will be updated weekly on the day of conference or as needed with changing behaviors to keep the patient and staff safe. A copy of the Behavior plan will be posted in the patient room, at the RN station with the  charge RN and in the patient medical record. If you have any questions or need clarification, please feel free to discuss with the interdisciplinary team mentioned above.  [name] [facility] 03/29/2023, 1:59 PM

## 2023-03-29 NOTE — Progress Notes (Signed)
Speech Language Pathology Daily Session Note  Patient Details  Name: CARLOS UPADHYAYA MRN: [id number] Date of Birth: 1950/09/14  Today's Date: 03/29/2023 SLP Individual Time: 0930-1008 SLP Individual Time Calculation (min): 38 min  Short Term Goals: Week 3: SLP Short Term Goal 1 (Week 3): Patient will demonstrate intellectual awareness of deficits via verbally stating 2 cognitive and 2 physical impairments given modA SLP Short Term Goal 2 (Week 3): Patient will demonstrate use of recommended safe swallow strategies during mealtimes with modA. SLP Short Term Goal 3 (Week 3): Patient will utilize external aids to orient to time and situation given minA SLP Short Term Goal 4 (Week 3): Patient will recall basic daily information and events in 80% of opportunities events given modA  Skilled Therapeutic Interventions: SLP conducted skilled therapy session targeting cognitive retraining skills. Patient received upright in chair and agreeable to therapy session. SLP guided patient through various scheduling and calendar tasks with patient requiring modA to orient to date utilizing visual aid. Patient benefited from schedule modifications (simplification of viewable information) and key to assist with decoding abbreviations to assist with answering questions. Majority of session was spent attempting to increase awareness and insight to deficits. These skills remain considerably limited despite max reeducation throughout session. Patient demonstrates confusion towards why veil bed is still necessary. SLP explained purposes with patient expressing limited understanding of provided information. Patient was left in chair with call bell in reach and chair alarm set. SLP will continue to target goals per plan of care.       Pain Pain Assessment Pain Scale: 0-10 Pain Score: 0-No pain  Therapy/Group: Individual Therapy  [name], M.A., CCC-SLP  [name] 03/29/2023, 12:24 PM

## 2023-03-29 NOTE — Progress Notes (Addendum)
PROGRESS NOTE   Subjective/Complaints:  Last night nursing noted an incident where TeleSitter monitor worn off and patient stood up from his wheelchair.  Due to alarm patient sat down on the floor.  He continued to have restlessness and confusion at night.  This morning he is calm sitting in wheelchair in his room.  ROS: Limited due to cognitive/behavioral   Objective:   No results found. Recent Labs    03/29/23 1529  WBC 4.5  HGB 8.6*  HCT 30.2*  PLT 152     Recent Labs    03/29/23 1528  NA 137  K 4.2  CL 97*  CO2 24  GLUCOSE 107*  BUN 57*  CREATININE 10.88*  CALCIUM 9.0      Intake/Output Summary (Last 24 hours) at 03/29/2023 1633 Last data filed at 03/29/2023 1300 Gross per 24 hour  Intake 954 ml  Output --  Net 954 ml         Physical Exam: Vital Signs Blood pressure (!) 140/84, pulse 88, temperature (!) 97.5 F (36.4 C), resp. rate (!) 33, height 5\' 4"  (1.626 m), weight 98.7 kg, SpO2 92%.  Constitutional: No distress . Vital signs reviewed.  Sitting in wheelchair in his room HEENT: NCAT, EOMI, oral membranes moist Neck: supple Cardiovascular: RRR without murmur. No JVD    Respiratory/Chest: CTA Bilaterally without wheezes or rales. Normal effort    GI/Abdomen: BS +, non-tender, non-distended soft Ext: no clubbing, cyanosis, or edema Psych: pleasant but confused  Neurologic Exam:   Alert. Follows simple commands, Poor awareness and insight.  Appears calm at the moment Oriented to self, hospital + Memory deficit - ongoing + Poor insight + Mild L ptosis   Sensory exam: Intact to touch in all 4 extremities Motor exam: moves all 4 extremities without limitiations Coordination: Slight bilateral upper extremity tremor, intention, mild.    Musculoskeletal: No joint swelling noted No abnormal tone      Assessment/Plan: 1. Functional deficits which require 15 hr per week of  interdisciplinary therapy in a comprehensive inpatient rehab setting. Physiatrist is providing close team supervision and 24 hour management of active medical problems listed below. Physiatrist and rehab team continue to assess barriers to discharge/monitor patient progress toward functional and medical goals  Care Tool:  Bathing    Body parts bathed by patient: Right arm, Left arm, Chest, Abdomen, Front perineal area, Right upper leg, Buttocks, Left upper leg, Right lower leg, Left lower leg, Face         Bathing assist Assist Level: Moderate Assistance - Patient 50 - 74%     Upper Body Dressing/Undressing Upper body dressing   What is the patient wearing?: Pull over shirt    Upper body assist Assist Level: Minimal Assistance - Patient > 75%    Lower Body Dressing/Undressing      Lower body dressing      What is the patient wearing?: Pants     Lower body assist Assist for lower body dressing: Moderate Assistance - Patient 50 - 74%     Toileting Toileting    Toileting assist Assist for toileting: Moderate Assistance - Patient 50 - 74%  Transfers Chair/bed transfer  Transfers assist     Chair/bed transfer assist level: Contact Guard/Touching assist     Locomotion Ambulation   Ambulation assist      Assist level: Minimal Assistance - Patient > 75% Assistive device: No Device Max distance: 175'   Walk 10 feet activity   Assist     Assist level: Contact Guard/Touching assist Assistive device: No Device   Walk 50 feet activity   Assist Walk 50 feet with 2 turns activity did not occur: Safety/medical concerns  Assist level: Minimal Assistance - Patient > 75% Assistive device: No Device    Walk 150 feet activity   Assist Walk 150 feet activity did not occur: Safety/medical concerns  Assist level: Minimal Assistance - Patient > 75% Assistive device: No Device    Walk 10 feet on uneven surface  activity   Assist Walk 10 feet on  uneven surfaces activity did not occur: Safety/medical concerns         Wheelchair     Assist Is the patient using a wheelchair?: Yes Type of Wheelchair: Manual    Wheelchair assist level: Dependent - Patient 0%      Wheelchair 50 feet with 2 turns activity    Assist        Assist Level: Dependent - Patient 0%   Wheelchair 150 feet activity     Assist      Assist Level: Dependent - Patient 0%   Blood pressure (!) 140/84, pulse 88, temperature (!) 97.5 F (36.4 C), resp. rate (!) 33, height 5\' 4"  (1.626 m), weight 98.7 kg, SpO2 92%.  Medical Problem List and Plan: 1. Functional deficits secondary to metabolic encephalopathy due to sepsis secondary to catheter peritonitis and hypoxemia              -patient may not shower             -ELOS/Goals: 16-18 days, PT/OT Sup, SLP min A -tentative discharge date 9-9           -Continue CIR therapies including PT, OT, and SLP   -Will review records from [facility] -- has worsened since approximately 8/22, possible 8/19, consistent with records from nursing at [facility] with current behavior.    - continue enclosure bed for safety  -  therapy 15/7  -Looking into SNF discharge   2.  Antithrombotics: -DVT/anticoagulation:  Pharmaceutical: Heparin, 03/23/23 changed to Lovenox yesterday to help improved compliance             -antiplatelet therapy: aspirin and Plavix   3. Pain Management: Tylenol as needed             -Lidoderm patches to back  -Elavil 25 mg at bedtime for nerve pain and sleep 8/27  -8/29: Pain well controlled on Elavil; DC lyrica, PRN Tylenol/oxy 2.5-5 mg d/t CKD  8-30: Appears patient has been getting pain medications together overnight along with antipsychotics, will discuss with nursing  8-31: Patient having an difficulty endorsing needs and not utilizing PRNs, scheduled oxycodone 5 mg 3 times daily and has 2.5 mg prn 3 times daily available.   9-1: Not utilizing PRNs, minimal complaints of pain,  will discontinue scheduled given lethargy today..  -muscle rub R shoulder  9/10-1 no reports of pain this morning   4. Mood/Behavior/Sleep: LCSW to evaluate and provide emotional support             -continue Seroquel 50 mg TID             -  clonazepam 0.5 mg TID prn             -Geodon 10 mg IM q 12 hours prn             -antipsychotic agents: n/a  -8-27: Agitated by soft restraints in bed, transition to 4 side rails, Posey alarm, and TeleSitter for now.  Veil bed on standby if needed, however feel we should avoid this if possible due to likelihood of agitating patient further   - 8/28: Placed in veil bed; wrist restraints for dialysis. Scheduled Geodon 20 mg twice daily; discontinue Seroquel and clonazepam due to no perceivable benefit. Add as needed Zyprexa 5 mg twice daily p.o. or IM due to recent cardiac arrhythmias and concern for QTc prolongation. As needed ramelteon for sleep. Stop gabapentin due to possible side effect of agitation  - 8/28: No improvement except temporarily with Geodon; add depakote 125 mg q8h for ongoing aggitation, mood stabilization. Increase elavil to 50 mg at bedtime.  Workup ongoing.    - 8/29: EEG negative, labs normal. MRI pending. Sleeping well and calm since starting depakote and increase in elavil. Will monitor tonight and wean Geodon in AM if appropriate.   8-30: MRI showing chronic microvascular changes, otherwise normal.  Increasingly calm and appropriate during the day, with ongoing agitation at nighttime.  Will increase nighttime Geodon to 40 mg; will discuss sleep log with nursing, as it has not been performed.  8-31 no sleep overnight per sleep log, again getting agitated around 9 PM and 5 AM.  Not responsive to as needed Zyprexa, responsive to Ativan.  Attempted to wean off scheduled a.m. Geodon today, but patient became extremely agitated and unable to redirect, calm down with readministration of medication.  Resume Geodon 20 mg every morning/40 mg every  afternoon, increased Elavil to 75 mg nightly, adding trazodone 50 mg nightly as needed.  P.o./IV Ativan 0.5 mg every 6 hours for agitation.  9-1: slept well overnight with trazodone and increased Elavil, no PRNs used.  Unfortunately, oversedated today.  Discontinue scheduled pain medications as above, start wean of Elavil to 50 mg x2 days -> 25 mg x2 days -> 10 mg x2 days, off due to no apparent benefit and no complaints of pain.  Reduce Geodon from 20 mg every morning/40 mg every evening to 20 mg twice daily.  Continue trazodone as needed for sleep, as this seems to be the difference for a restful night yesterday.  If he remains overly sedated, would dose reduce and DC Depakote next.  Keep Ativan as needed d/t dialysis not having other options available.  9/2-3--non agitated but still sleepy   -dc'ed depakote as I doubt it's doing much at this dose anyway   -continue geodon at 20mg  bid   -scheduled trazodone at HS with back up dose if needed    -sleep was intermittent last night   -requires enclosure bed for safety  9/4 continue enclosure bed for now, consider trying DC of this device early next week  -9/5 appears to be doing better overall this morning, continues to have intermittent agitation at times.  Continue enclosure bed  -9/6 agitation appears to be improving, patient is skeptical about medications and has been declining some of these.  Will discontinue multivitamin, RisaQuad to try to decrease pill burden  -9/7 continues to have intermittent episodes of agitation however overall improving, appears to be taking most of his medications  9/9 agitation and mental status appears to be improving -discussing discontinuation of  enclosure bed with team 9/10 Geodon dose increased to 40 mg at night, continue 20 mg in the morning 9/11 No acute events noted, will check with team regarding DC enclosure bed, nursing reports he did ok last night but had dialysis 9/12 continues to have sundowning/waxing  waning confusion and agitation, continue enclosure bed for now, will caution with Geodon will continue current dose but hold off on any escalation as this can contribute to bradycardia 5. Neuropsych/cognition: This patient is not capable of making decisions on his own behalf   -recommend neuropsych consult   - 8/27: Ongoing aggitation; Consult hospitalist for further workup on potential contributors to encephalopathy. Pending UA, LA 2.1-> 1.4; otherwise negative so far -Delirium precautions, sleep log, Veiled bed for safety and to minimize restraints -Wife has expressed that she is not interested in palliative care at this time; hopefully with above measures patient can get dialysis in the next 48 hours to limit worsening uremic encephalopathy and medical decline    - 8/29: Aggitation much improved, able to get some dialysis last night and today. See above for medication changes. Calling wife tonight to discuss.   8-30: Still with moderate cognitive deficits, however appears once patient is less agitated he is better able to concentrate and has better orientation.  MRI negative for acute findings, monitor.  9/3 agitation better. More sedated than anything at this point. Confused as well.    -depakote stopped   -continue other meds as rx'ed  9/9 seems to be fairly alert and calm today 6. Skin/Wound Care: Routine skin care checks             -sacral skin off-loading/protection   7. Fluids/Electrolytes/Nutrition: Strict Is and Os and follow-up chemistries per nephrology             -renal diet/ 1200 cc FR  - Renvela 800 mg 3 times daily for hyperphosphatemia added per nephrology 8-27   8: Hypotension: monitor TID and prn             -continue midodrine as needed during dialysis  Normotensive, monitor  9-1: Consistently 90s over 60s, has been stable but given findings of microvascular changes on MRI, may be too low for patient to perfuse properly.  Will add midodrine 5 mg tid and monitor.  DC Coreg  as heart rate has been low to normal; may resume at low-dose once blood pressure improved.   9/2 continue with ccurrent regimen. Bp's hr remain low   -elavil wean should help  9/9-10 BP stable, continue midodrine and monitor  9/12 BP controlled overall, continue to monitor     03/29/2023    4:30 PM 03/29/2023    4:00 PM 03/29/2023    3:23 PM  Vitals with BMI  Systolic 140 115 811  Diastolic 84 100 64  Pulse   88     9: Hyperlipidemia: continue statin   10: CAD s/p CABG 2023: on DAPT and statin.  - troponin elevated but stable, monitor 8/27   11: DM-2: A1c = 6.3% (at home on Lantus 22 units at bedtime, Tradjenta 5 mg daily)        -monitor PO intake             -continue SSI (0-6 units) does not appear to be requiring coverage   - 9/6 DC SSI, change CBGs to daily  9/8 discontinue CBGs  9/12 glucose controlled on CBG   Recent Labs    03/26/23 1641 03/26/23 2009  GLUCAP 113*  95     12: ESRD: HD on Tues, Thurs, Sat -nephrology made aware   - Unable to tolerate 8/27 d/t aggitation, stopped early 8/28;  - attempting again 8/29, stopped early again -Per nephrology, redirectable with gum -Have to switch restraints during dialysis sessions from veil bed to regular bed, then resume veil bed order once at rehab -9/9 tolerating HD better  9/12 nephrology feels like he will need sitter during outpatient dialysis whenever he is able to transition   13: COPD/OSA; chronic home 2L O2 (Symbicort, Ventolin, prednisone 40 mg daily at home)             -continue symbicort  -Added home oxygen back 8-27   14: GERD: continue Protonix   15: Non-sustained VT/pAF: per cardiology, Dr. Algie Coffer: -continue carvedilol 6.25 mg BID; consider increasing to 12.5 mg BID before starting low dose amiodarone  -consider Eliquis and discontinue asa and Plavix in one week if no active GI bleeding and stable hemoglobin from 8/24 -hemoglobin has remained stable but very low around 7, no recurrent A-fib, monitor  for now given agitation/fall 8-31 and consider initiating Eliquis when safe   -Continue mag-ox to avoid hypomagnesemia -8-27: Single instance of bradycardia this morning 40s, may have been missed read, monitor through today and do not further adjust beta-blocker medications  -9-3 hr controlled after holding coreg -9/4-5 HR stable, continue to monitor -9/9 consider Eliquis this week if agitation continues to improve -9/10 will hold off on Eliquis due to sundowning at night, will see how he does with increase Geodon -9/11 EKG for bradycardia episode today, 2nd degree A-V block mobitz type 1.  Continue to hold beta blocker,9/11 will restart eliquis at 2.5 mg BID dose. Will consult cardiology regarding need for ASA/plavix and coreg.  9/12 appreciate cardiology consult yesterday, DAPT transition to daily aspirin 81 only and Eliquis was discontinued, caution with Geodon as this can contribute to bradycardia  16: HFrEF: Daily weights. Monitor for signs of overload  - 9/7 continue daily weights, fluid level management per hemodialysis team  Filed Weights   03/24/23 0848 03/24/23 1151 03/29/23 1444  Weight: 98.7 kg 97.4 kg 98.7 kg      17: Liver nodularity on CT scan: LFTs reported to be normal. Avoid hepatotoxic medications    -Abdominal ultrasound earlier this admission with minimal ascites; ammonia within normal limits  -8/31: Did discuss with nephrology whether p.o. lactulose would be beneficial despite normal ammonia, after reviewing records unsure that this would be beneficial    18: Epistaxis - resolved             -previously improved with flonase and saline spray   19: Morbid Obesity. Dietary counseling Body mass index is 39.01 kg/m.      LOS: 17 days A FACE TO FACE EVALUATION WAS PERFORMED  Fanny Dance 03/29/2023, 4:33 PM

## 2023-03-29 NOTE — Progress Notes (Signed)
Physical Therapy Session Note  Patient Details  Name: [name] [name] MRN: [id number] Date of Birth: 10-13-1950  Today's Date: 03/29/2023 PT Individual Time: 4742-5956 PT Individual Time Calculation (min): 39 min   Short Term Goals: Week 2:  PT Short Term Goal 1 (Week 2): STGs = LTGs  Skilled Therapeutic Interventions/Progress Updates:     Pt received seated in [facility] and agrees to therapy. No complaint of pain. WC transport to gym for time management. Pt performs sit to stand transfer with cues for initiation and CGA, then ambulates x175' without AD. Pt ambulates initial x100' with CGA, then requires increasing assistance with fatigue. Pt requires several brief standing rest breaks and reaches out for objects and furniture for stability. PT provides minA at shoulders due to increasing anterior trunk lean and decreased stability. Pt takes extended seated rest break and has intermittent agitation during rest break, with PT attempting to orient pt and explain hospitalization and safety measures in place to prevent injury. Pt is moderately redirectable but continues to be frustrated. Pt ambulates additional x175' with slightly improved endurance demonstrated, with same assistance and cueing provided from PT. WC transport back to room. Left seated in WC with alarm intact and all needs within reach.   Therapy Documentation Precautions:  Precautions Precautions: Fall Restrictions Weight Bearing Restrictions: No    Therapy/Group: Individual Therapy  Beau Fanny, PT, DPT 03/29/2023, 4:22 PM

## 2023-03-29 NOTE — Progress Notes (Signed)
[name] Progress Note   Subjective:   Seated in wheelchair, eating breakfast. Alert and calm. Denies SOB, CP, dizziness, nausea, edema.   Objective Vitals:   03/28/23 0343 03/28/23 1527 03/28/23 1737 03/29/23 0644  BP: 137/70 (!) 145/74 128/88 129/73  Pulse: (!) 53 70 67 61  Resp: 18 18 18 20   Temp:  98.5 F (36.9 C) (!) 97.5 F (36.4 C) (!) 97.4 F (36.3 C)  TempSrc:  Axillary    SpO2: 93% 96% 97% 100%  Weight:      Height:       Physical Exam General: Alert male in NAD Heart: RRR, no murmurs, rubs or gallops Lungs: CTA bilaterally, normal WOB Abdomen: Soft, non-distended, +BS Extremities: No edema b/l lower extremities Dialysis Access:  LUE AVF + t/b  Additional Objective Labs: Basic Metabolic Panel: Recent Labs  Lab 03/22/23 1235 03/24/23 0925  NA 135 136  K 4.6 4.2  CL 96* 99  CO2 28 24  GLUCOSE 139* 118*  BUN 43* 33*  CREATININE 9.84* 8.85*  CALCIUM 9.0 8.6*  PHOS 4.3 3.1   Liver Function Tests: Recent Labs  Lab 03/22/23 1235 03/24/23 0925  ALBUMIN 2.2* 2.0*   No results for input(s): "LIPASE", "AMYLASE" in the last 168 hours. CBC: Recent Labs  Lab 03/22/23 1235 03/24/23 0925  WBC 5.0 4.3  HGB 8.8* 8.1*  HCT 29.7* 28.1*  MCV 97.7 97.2  PLT 192 186   Blood Culture    Component Value Date/Time   SDES BLOOD RIGHT HAND 02/19/2023 1855   SPECREQUEST  02/19/2023 1855    BOTTLES DRAWN AEROBIC ONLY Blood Culture results may not be optimal due to an inadequate volume of blood received in culture bottles   CULT  02/19/2023 1855    NO GROWTH 5 DAYS Performed at [facility], [address], [place], [place] [address]    REPTSTATUS 02/24/2023 FINAL 02/19/2023 1855    Cardiac Enzymes: No results for input(s): "CKTOTAL", "CKMB", "CKMBINDEX", "TROPONINI" in the last 168 hours. CBG: Recent Labs  Lab 03/25/23 1728 03/26/23 0605 03/26/23 1134 03/26/23 1641 03/26/23 2009  GLUCAP 110* 98 102* 113* 95   Iron Studies:  No results for input(s): "IRON", "TIBC", "TRANSFERRIN", "FERRITIN" in the last 72 hours. @lablastinr3 @ Studies/Results: No results found. Medications:   (feeding supplement) PROSource Plus  30 mL Oral BID BM   aspirin EC  81 mg Oral Daily   atorvastatin  80 mg Oral Daily   Chlorhexidine Gluconate Cloth  6 each Topical Q0600   Chlorhexidine Gluconate Cloth  6 each Topical Q0600   darbepoetin (ARANESP) injection - DIALYSIS  150 mcg Subcutaneous Q Sun-1800   diphenhydrAMINE  25 mg Intravenous Once   divalproex  125 mg Oral Q8H   feeding supplement  237 mL Oral BID WC   Gerhardt's butt cream  1 Application Topical QID   lidocaine  1 patch Transdermal Q2200   lidocaine  1 patch Transdermal Q24H   magnesium oxide  400 mg Oral Daily   midodrine  5 mg Oral TID WC   mometasone-formoterol  2 puff Inhalation BID   pantoprazole  40 mg Oral BID   sevelamer carbonate  800 mg Oral TID WC   traZODone  50 mg Oral QHS   ziprasidone  20 mg Oral Daily   ziprasidone  40 mg Oral Daily    Dialysis Orders: Previously on [facility]CPD thru [facility] - changed to HD - will need outpatient HD unit established prior to  d/c.     Assessment/Plan: 1. Debility - in CIR 2. Encephalopathy/agitation: Patient was having issues with agitation previously. Unclear of etiology. Ammonia fine and brain MRI with no acute findings. Currently, his behavior is improving but waxes/wanes. 3. ESRD:  Transitioned from PD to HD recently on TTS schedule. Using old AVF which is still functional, s/p f'gram 02/28/23.  Might req new CLIP if going to SNF 4. Hypertension/volume: BP soft with some hypotension on HD. No respiratory issues.  Working to establish dry weight. Reinforced fluid restrictions.   5. Anemia: Tsat 38% with ferritin 2175.  No iron. Increased aranesp to for 9/1. Hgb now 8.1.  6. Metabolic bone disease: CorrCa and phos in goal.  On Renvela 1 AC TID. 7.  Nutrition:  Alb very low, continue supps.  Renal diet  w/fluid restrictions.  8.  Hx NSVT: On Coreg 10.  Recent PD related peritonitis (+TNC >2000, cx's neg) and related +fungemia/ enterococcus bacteremia - underwent PD cath removal, TDC placement and HD cath placement and line holiday. Completed IV unasyn, IV micafungin and cipro through 8/15. TDC not replaced cause he had functional AVF.   11. Dispo: Patient's behavior is improving. He is still requiring enclosure bed. Will need to be able to calmly sit through HD for a full outpatient treatment without trying to stand for his own safety. It does seem like we are moving in the right direction but was agitated at HD yesterday. I think he would benefit from a sitter at outpatient HD, will discuss with renal navigator  Rogers Blocker, PA-C 03/29/2023, 10:00 AM  [facility] Pager: [phone]

## 2023-03-29 NOTE — Progress Notes (Signed)
Received patient in bed.Alert to self. Confused.Consent verified.  Medicine given : Lorazepam 0.5   mg iv.    Access used : Left upper arm AVF that worked well.  Duration of treatment 1.38 hours.  Fluid removed: 800 cc.  Hemo comment: Patient keep on shouting,other [patient's were scare. A lot of distractions from this patient.Ativan was not effective to him, [facility] made aware.stopped treatment as ordered.  Hand off to the patient's nurse.

## 2023-03-30 ENCOUNTER — Encounter (HOSPITAL_COMMUNITY): Payer: Medicare Other

## 2023-03-30 ENCOUNTER — Ambulatory Visit: Payer: Medicare Other | Admitting: Internal Medicine

## 2023-03-30 DIAGNOSIS — N250 Renal osteodystrophy: Secondary | ICD-10-CM | POA: Diagnosis not present

## 2023-03-30 DIAGNOSIS — I120 Hypertensive chronic kidney disease with stage 5 chronic kidney disease or end stage renal disease: Secondary | ICD-10-CM | POA: Diagnosis not present

## 2023-03-30 DIAGNOSIS — R001 Bradycardia, unspecified: Secondary | ICD-10-CM

## 2023-03-30 DIAGNOSIS — D631 Anemia in chronic kidney disease: Secondary | ICD-10-CM | POA: Diagnosis not present

## 2023-03-30 DIAGNOSIS — R5381 Other malaise: Secondary | ICD-10-CM | POA: Diagnosis not present

## 2023-03-30 DIAGNOSIS — N186 End stage renal disease: Secondary | ICD-10-CM | POA: Diagnosis not present

## 2023-03-30 DIAGNOSIS — R4182 Altered mental status, unspecified: Secondary | ICD-10-CM | POA: Diagnosis not present

## 2023-03-30 DIAGNOSIS — Z992 Dependence on renal dialysis: Secondary | ICD-10-CM | POA: Diagnosis not present

## 2023-03-30 MED ORDER — ZIPRASIDONE HCL 20 MG PO CAPS
20.0000 mg | ORAL_CAPSULE | Freq: Every day | ORAL | Status: DC
  Administered 2023-03-30 – 2023-04-02 (×4): 20 mg via ORAL
  Filled 2023-03-30 (×5): qty 1

## 2023-03-30 NOTE — Progress Notes (Signed)
Speech Language Pathology Daily Session Note  Patient Details  Name: [name] [name] MRN: [id number] Date of Birth: 1951-05-22  Today's Date: 03/30/2023 SLP Individual Time: 7829-5621 SLP Individual Time Calculation (min): 22 min  Short Term Goals: Week 3: SLP Short Term Goal 1 (Week 3): Patient will demonstrate intellectual awareness of deficits via verbally stating 2 cognitive and 2 physical impairments given modA SLP Short Term Goal 2 (Week 3): Patient will demonstrate use of recommended safe swallow strategies during mealtimes with modA. SLP Short Term Goal 3 (Week 3): Patient will utilize external aids to orient to time and situation given minA SLP Short Term Goal 4 (Week 3): Patient will recall basic daily information and events in 80% of opportunities events given modA  Skilled Therapeutic Interventions: SLP conducted skilled therapy session targeting cognitive retraining goals. SLP received patient upright in chair but fast asleep with wife and great grandson present. Patient difficult to rouse, often only opening eyes for short periods of time. SLP utilized room calendar aid to facilitate orientation-based therapy tasks. Patient required minA to orient to date but max encouragement to open eyes to look at calendar. Patient exhibited significant difficulty switching set between orientation questions, often answering previously asked questions instead of new prompt. After patient became slightly more awake, requested to "go outside" and disoriented to location. Once SLP reoriented patient to hospital setting, patient requested assistance to bed to sleep. Patient declined further therapy. Patient was left in lowered bed with call bell in reach and bed alarm set. SLP will continue to target goals per plan of care.      Pain Pain Assessment Pain Scale: 0-10 Pain Score: 0-No pain  Therapy/Group: Individual Therapy  [name], M.A., CCC-SLP  [name] 03/30/2023, 3:24 PM

## 2023-03-30 NOTE — Progress Notes (Signed)
Physical Therapy Weekly Progress Note  Patient Details  Name: [name] [name] MRN: [id number] Date of Birth: 1950-11-16  Beginning of progress report period: March 22, 2023 End of progress report period: March 30, 2023  Today's Date: 03/30/2023 PT Individual Time: 7893-8101 PT Individual Time Calculation (min): 41 min   Short term goals not set due to ELOS. Pt making variable progress depending on fatigue level and agitation. Overall pt making good progress towards functional goals, with pt CGA for transfers and CGA/min assist for ambulation up to 175' without device when not fatigue. When pt has fatigue and agitation pt does require up to mod assist only due to unable to be redirected. Pt's family has not had formal family training due to pt agitation.  Patient continues to demonstrate the following deficits muscle weakness, decreased cardiorespiratoy endurance,  , decreased initiation, decreased attention, decreased awareness, decreased problem solving, decreased safety awareness, and decreased memory, and decreased standing balance, decreased postural control, and decreased balance strategies and therefore will continue to benefit from skilled PT intervention to increase functional independence with mobility.  Patient progressing toward long term goals..  Continue plan of care.  PT Short Term Goals Week 3:  PT Short Term Goal 1 (Week 3): STG = LTG due to ELOS  Skilled Therapeutic Interventions/Progress Updates:  Pt presents in room in bed, asleep requiring increased time to wake with pt frequently falling back to sleep. Pt agreeable to PT however once awake and denies pain. Session focused on participating with self care tasks as well as safety awareness with mobility. Pt with increased agitation associated with fatigue today and requires increased time to redirect.  Pt completes bed mobility with supervision/min assist and max verbal cues due to pt attempting to roll onto stomach  and slide off of bed into floor. Pt provided with gentle verbal cues to redirect with pt able to roll onto back and come to sitting on EOB with supervision. Pt requires mod assist for donning button up shirt due to fatigue and decreased initiation, therapist dons shoes total assist for time management. Pt completes stand pivot transfer with min assist, increased time to complete and cues for sequencing. Pt then transported in [facility] to day room however once in day room pt requesting to clean face and hands. Pt taken back to room and positioned in sitting in front of sink, completes washing face and hands in sitting with verbal cues for initiation.  Pt impulsively attempts to stand several times throughout session requiring close supervision throughout session and verbal cues to redirect.  Therapist prompts pt to complete gait training however pt declines due to wanting to eat breakfast. Pt positioned behind nursing station with chair alarm donned and activated, pt nurse tech notified of pt wishing to eat breakfast at end of session.  Ambulation/gait training;Community reintegration;DME/adaptive equipment instruction;Psychosocial support;Stair training;Neuromuscular re-education;Wheelchair propulsion/positioning;UE/LE Strength taining/ROM;Balance/vestibular training;Discharge planning;Pain management;Therapeutic Activities;UE/LE Coordination activities;Cognitive remediation/compensation;Functional mobility training;Disease management/prevention;Patient/family education;Therapeutic Exercise;Visual/perceptual remediation/compensation    Therapy Documentation Precautions:  Precautions Precautions: Fall Restrictions Weight Bearing Restrictions: No  Therapy/Group: Individual Therapy  Edwin Cap PT, DPT 03/30/2023, 12:25 PM

## 2023-03-30 NOTE — Progress Notes (Signed)
Occupational Therapy Session Note  Patient Details  Name: [name] [name] MRN: [id number] Date of Birth: 07/03/1951  Today's Date: 03/30/2023 OT Individual Time: 1003-1057 OT Individual Time Calculation (min): 54 min    Short Term Goals: Week 3:  OT Short Term Goal 1 (Week 3): LTG=STG 2/2 ELOS  Skilled Therapeutic Interventions/Progress Updates:    Pt greeted asleep at the nurses station. Pt brought to therapy gym in wc. Pt needed max multimodal cues to wake, and would then quickly fall back asleep. With increased time and max cues, OT able to get pt to stand without device and min A, but would then just returned to seated position to sleep. OT able to get pt to ambulate 5 feet without device and min A, but then attempted to sit down on wheeled stool requiring max A from OT to safely sit down in wc instead. Sit<>stand at high-low table with min A, then tried to engage pt with card game in standing. Pt stood and then leaned over table to attempt to shuffle cards, but falling asleep. Pt returned to sitting and OT had pt deal out 8 cards with max multimodal cues due to lethargy. Pt able to place 2 cards with max cues. Pt returned to nurses station and left with alarm belt on and needs met.  Therapy Documentation Precautions:  Precautions Precautions: Fall Restrictions Weight Bearing Restrictions: No Pain: Pain Assessment Pain Scale: 0-10 Pain Score: 0-No pain   Therapy/Group: Individual Therapy  [name] 03/30/2023, 11:03 AM

## 2023-03-30 NOTE — Progress Notes (Signed)
[facility] Progress Note   Subjective:  Seen in room - no c/o today. No CP/dyspnea. HD aborted after 1.5hr yesterday - was shouting and being disruptive -> will need sitter with him during HD.  Objective Vitals:   03/29/23 1948 03/29/23 1951 03/30/23 0500 03/30/23 0525  BP:  (!) 161/90  (!) 144/79  Pulse: 71 70  (!) 55  Resp: 18 18  16   Temp:  98 F (36.7 C)  97.8 F (36.6 C)  TempSrc:  Axillary  Oral  SpO2: 94% 94%  (!) 82%  Weight:   98.4 kg   Height:       Physical Exam General: Confused appearing man, NAD. Room air. + eyelid edema Heart:RRR; no murmur Lungs: CTAB; no rales Abdomen: soft, non-tender Extremities: no LE edema Dialysis Access: LUE AVF + bruit  Additional Objective Labs: Basic Metabolic Panel: Recent Labs  Lab 03/24/23 0925 03/29/23 1528  NA 136 137  K 4.2 4.2  CL 99 97*  CO2 24 24  GLUCOSE 118* 107*  BUN 33* 57*  CREATININE 8.85* 10.88*  CALCIUM 8.6* 9.0  PHOS 3.1 2.6   Liver Function Tests: Recent Labs  Lab 03/24/23 0925 03/29/23 1528  ALBUMIN 2.0* 2.1*   CBC: Recent Labs  Lab 03/24/23 0925 03/29/23 1529  WBC 4.3 4.5  HGB 8.1* 8.6*  HCT 28.1* 30.2*  MCV 97.2 97.1  PLT 186 152   Medications:   (feeding supplement) PROSource Plus  30 mL Oral BID BM   aspirin EC  81 mg Oral Daily   atorvastatin  80 mg Oral Daily   darbepoetin (ARANESP) injection - DIALYSIS  150 mcg Subcutaneous Q Sun-1800   diphenhydrAMINE  25 mg Intravenous Once   divalproex  125 mg Oral Q8H   feeding supplement  237 mL Oral BID WC   Gerhardt's butt cream  1 Application Topical QID   lidocaine  1 patch Transdermal Q2200   lidocaine  1 patch Transdermal Q24H   magnesium oxide  400 mg Oral Daily   midodrine  5 mg Oral TID WC   mometasone-formoterol  2 puff Inhalation BID   pantoprazole  40 mg Oral BID   sevelamer carbonate  800 mg Oral TID WC   traZODone  50 mg Oral QHS   ziprasidone  20 mg Oral Daily   ziprasidone  40 mg Oral Daily     Dialysis Orders: Previously on [facility] thru [facility] - changed to HD - will need outpatient HD unit established prior to d/c. 4hr, 3K, EDW ~96, L AVF   Assessment/Plan: 1. Debility - in CIR 2. Encephalopathy/agitation: Waxing/waning agitation, unclear etiology. Ammonia fine and brain MRI with no acute findings. Disruptive again during HD on 9/12. 3. ESRD: Transitioned from PD to HD recently on TTS schedule. Using old AVF which is still functional, s/p f'gram 02/28/23.  Might req new CLIP if going to SNF 4. Hypotension/volume: BP higher today, volume up. Still on mido 5mg  TID -> adding holding parameters to not be given when SBP > 130. Only min UF with last HD due to agitation. 5. Anemia: Hgb 8.6 - improving on Aranesp weekly. Last Tsat 38% with ferritin 2175.  No iron.  6. Metabolic bone disease: CorrCa on high side, Phos good. Continue Renvela 1 AC TID, no VDRA. 7.  Nutrition:  Alb very low, continue supps.  Renal diet w/fluid restrictions.  8.  Hx NSVT: On Coreg 10.  Recent PD related peritonitis (+TNC >2000, cx's neg) and  related fungemia/ enterococcus bacteremia - underwent PD cath removal, TDC placement and HD cath placement and line holiday. Completed IV unasyn, IV micafungin and cipro on 8/15. TDC not replaced cause he had functional AVF.   11. Dispo: Waxing/waning behavioral issues. Will need to be able to calmly sit through HD for a full outpatient treatment without trying to stand for his own safety. It does seem like we are moving in the right direction but was agitated at HD yesterday. I think he would benefit from a sitter at outpatient HD, will discuss with renal navigator.     Ozzie Hoyle, PA-C 03/30/2023, 8:39 AM  [employer]

## 2023-03-30 NOTE — Progress Notes (Signed)
PROGRESS NOTE   Subjective/Complaints:  Patient tired today, did not sleep as well last night.  More agitated yesterday during dialysis and HD reported after 1.5 hours.  ROS: Limited due to cognitive/behavioral   Objective:   No results found. Recent Labs    03/29/23 1529  WBC 4.5  HGB 8.6*  HCT 30.2*  PLT 152     Recent Labs    03/29/23 1528  NA 137  K 4.2  CL 97*  CO2 24  GLUCOSE 107*  BUN 57*  CREATININE 10.88*  CALCIUM 9.0      Intake/Output Summary (Last 24 hours) at 03/30/2023 1305 Last data filed at 03/30/2023 0900 Gross per 24 hour  Intake 340 ml  Output 850 ml  Net -510 ml         Physical Exam: Vital Signs Blood pressure (!) 144/79, pulse (!) 55, temperature 97.8 F (36.6 C), temperature source Oral, resp. rate 16, height 5\' 4"  (1.626 m), weight 98.4 kg, SpO2 (!) 82%.  Constitutional: No distress . Vital signs reviewed.  Sitting in wheelchair at nurses station HEENT: NCAT, EOMI, oral membranes moist Neck: supple Cardiovascular: RRR without murmur. No JVD    Respiratory/Chest: CTA Bilaterally without wheezes or rales. Normal effort    GI/Abdomen: BS +, non-tender, non-distended soft Ext: no clubbing, cyanosis, or edema Psych: pleasant but confused  Neurologic Exam:   Alert.  Drowsy but wakes to voice, follows simple commands, Poor awareness and insight.  Appears calm at the moment Oriented to self, hospital + Memory deficit - ongoing + Poor insight + Mild L ptosis   Sensory exam: Intact to touch in all 4 extremities Motor exam: moves all 4 extremities without limitiations Coordination: Slight bilateral upper extremity tremor, intention, mild.    Musculoskeletal: No joint swelling noted No abnormal tone      Assessment/Plan: 1. Functional deficits which require 15 hr per week of interdisciplinary therapy in a comprehensive inpatient rehab setting. Physiatrist is providing  close team supervision and 24 hour management of active medical problems listed below. Physiatrist and rehab team continue to assess barriers to discharge/monitor patient progress toward functional and medical goals  Care Tool:  Bathing    Body parts bathed by patient: Right arm, Left arm, Chest, Abdomen, Front perineal area, Right upper leg, Buttocks, Left upper leg, Right lower leg, Left lower leg, Face         Bathing assist Assist Level: Moderate Assistance - Patient 50 - 74%     Upper Body Dressing/Undressing Upper body dressing   What is the patient wearing?: Pull over shirt    Upper body assist Assist Level: Minimal Assistance - Patient > 75%    Lower Body Dressing/Undressing      Lower body dressing      What is the patient wearing?: Pants     Lower body assist Assist for lower body dressing: Moderate Assistance - Patient 50 - 74%     Toileting Toileting    Toileting assist Assist for toileting: Moderate Assistance - Patient 50 - 74%     Transfers Chair/bed transfer  Transfers assist     Chair/bed transfer assist level: Minimal Assistance - Patient >  75%     Locomotion Ambulation   Ambulation assist      Assist level: Minimal Assistance - Patient > 75% Assistive device: No Device Max distance: 175'   Walk 10 feet activity   Assist     Assist level: Contact Guard/Touching assist Assistive device: No Device   Walk 50 feet activity   Assist Walk 50 feet with 2 turns activity did not occur: Safety/medical concerns  Assist level: Minimal Assistance - Patient > 75% Assistive device: No Device    Walk 150 feet activity   Assist Walk 150 feet activity did not occur: Safety/medical concerns  Assist level: Minimal Assistance - Patient > 75% Assistive device: No Device    Walk 10 feet on uneven surface  activity   Assist Walk 10 feet on uneven surfaces activity did not occur: Safety/medical concerns          Wheelchair     Assist Is the patient using a wheelchair?: Yes Type of Wheelchair: Manual    Wheelchair assist level: Dependent - Patient 0%      Wheelchair 50 feet with 2 turns activity    Assist        Assist Level: Dependent - Patient 0%   Wheelchair 150 feet activity     Assist      Assist Level: Dependent - Patient 0%   Blood pressure (!) 144/79, pulse (!) 55, temperature 97.8 F (36.6 C), temperature source Oral, resp. rate 16, height 5\' 4"  (1.626 m), weight 98.4 kg, SpO2 (!) 82%.  Medical Problem List and Plan: 1. Functional deficits secondary to metabolic encephalopathy due to sepsis secondary to catheter peritonitis and hypoxemia              -patient may not shower             -ELOS/Goals: 16-18 days, PT/OT Sup, SLP min A -tentative discharge date 9-9           -Continue CIR therapies including PT, OT, and SLP   -Will review records from [facility] -- has worsened since approximately 8/22, possible 8/19, consistent with records from nursing at [facility] with current behavior.    - continue enclosure bed for safety  -  therapy 15/7  -Looking into SNF discharge   2.  Antithrombotics: -DVT/anticoagulation:  Pharmaceutical: Heparin, 03/23/23 changed to Lovenox yesterday to help improved compliance             -antiplatelet therapy: aspirin and Plavix   3. Pain Management: Tylenol as needed             -Lidoderm patches to back  -Elavil 25 mg at bedtime for nerve pain and sleep 8/27  -8/29: Pain well controlled on Elavil; DC lyrica, PRN Tylenol/oxy 2.5-5 mg d/t CKD  8-30: Appears patient has been getting pain medications together overnight along with antipsychotics, will discuss with nursing  8-31: Patient having an difficulty endorsing needs and not utilizing PRNs, scheduled oxycodone 5 mg 3 times daily and has 2.5 mg prn 3 times daily available.   9-1: Not utilizing PRNs, minimal complaints of pain, will discontinue scheduled given lethargy  today..  -muscle rub R shoulder  9/10-13 no reports of pain this morning   4. Mood/Behavior/Sleep: LCSW to evaluate and provide emotional support             -continue Seroquel 50 mg TID             -clonazepam 0.5 mg TID prn             -  Geodon 10 mg IM q 12 hours prn             -antipsychotic agents: n/a  -8-27: Agitated by soft restraints in bed, transition to 4 side rails, Posey alarm, and TeleSitter for now.  Veil bed on standby if needed, however feel we should avoid this if possible due to likelihood of agitating patient further   - 8/28: Placed in veil bed; wrist restraints for dialysis. Scheduled Geodon 20 mg twice daily; discontinue Seroquel and clonazepam due to no perceivable benefit. Add as needed Zyprexa 5 mg twice daily p.o. or IM due to recent cardiac arrhythmias and concern for QTc prolongation. As needed ramelteon for sleep. Stop gabapentin due to possible side effect of agitation  - 8/28: No improvement except temporarily with Geodon; add depakote 125 mg q8h for ongoing aggitation, mood stabilization. Increase elavil to 50 mg at bedtime.  Workup ongoing.    - 8/29: EEG negative, labs normal. MRI pending. Sleeping well and calm since starting depakote and increase in elavil. Will monitor tonight and wean Geodon in AM if appropriate.   8-30: MRI showing chronic microvascular changes, otherwise normal.  Increasingly calm and appropriate during the day, with ongoing agitation at nighttime.  Will increase nighttime Geodon to 40 mg; will discuss sleep log with nursing, as it has not been performed.  8-31 no sleep overnight per sleep log, again getting agitated around 9 PM and 5 AM.  Not responsive to as needed Zyprexa, responsive to Ativan.  Attempted to wean off scheduled a.m. Geodon today, but patient became extremely agitated and unable to redirect, calm down with readministration of medication.  Resume Geodon 20 mg every morning/40 mg every afternoon, increased Elavil to 75 mg  nightly, adding trazodone 50 mg nightly as needed.  P.o./IV Ativan 0.5 mg every 6 hours for agitation.  9-1: slept well overnight with trazodone and increased Elavil, no PRNs used.  Unfortunately, oversedated today.  Discontinue scheduled pain medications as above, start wean of Elavil to 50 mg x2 days -> 25 mg x2 days -> 10 mg x2 days, off due to no apparent benefit and no complaints of pain.  Reduce Geodon from 20 mg every morning/40 mg every evening to 20 mg twice daily.  Continue trazodone as needed for sleep, as this seems to be the difference for a restful night yesterday.  If he remains overly sedated, would dose reduce and DC Depakote next.  Keep Ativan as needed d/t dialysis not having other options available.  9/2-3--non agitated but still sleepy   -dc'ed depakote as I doubt it's doing much at this dose anyway   -continue geodon at 20mg  bid   -scheduled trazodone at HS with back up dose if needed    -sleep was intermittent last night   -requires enclosure bed for safety  9/4 continue enclosure bed for now, consider trying DC of this device early next week  -9/5 appears to be doing better overall this morning, continues to have intermittent agitation at times.  Continue enclosure bed  -9/6 agitation appears to be improving, patient is skeptical about medications and has been declining some of these.  Will discontinue multivitamin, RisaQuad to try to decrease pill burden  -9/7 continues to have intermittent episodes of agitation however overall improving, appears to be taking most of his medications  9/9 agitation and mental status appears to be improving -discussing discontinuation of enclosure bed with team 9/10 Geodon dose increased to 40 mg at night, continue 20 mg in  the morning 9/11 No acute events noted, will check with team regarding DC enclosure bed, nursing reports he did ok last night but had dialysis 9/12 continues to have sundowning/waxing waning confusion and agitation, continue  enclosure bed for now, will caution with Geodon will continue current dose but hold off on any escalation as this can contribute to bradycardia 9/13 increased agitation yesterday, will need to continue enclosure bed, continues to have waxing waning confusion and agitation 5. Neuropsych/cognition: This patient is not capable of making decisions on his own behalf   -recommend neuropsych consult   - 8/27: Ongoing aggitation; Consult hospitalist for further workup on potential contributors to encephalopathy. Pending UA, LA 2.1-> 1.4; otherwise negative so far -Delirium precautions, sleep log, Veiled bed for safety and to minimize restraints -Wife has expressed that she is not interested in palliative care at this time; hopefully with above measures patient can get dialysis in the next 48 hours to limit worsening uremic encephalopathy and medical decline    - 8/29: Aggitation much improved, able to get some dialysis last night and today. See above for medication changes. Calling wife tonight to discuss.   8-30: Still with moderate cognitive deficits, however appears once patient is less agitated he is better able to concentrate and has better orientation.  MRI negative for acute findings, monitor.  9/3 agitation better. More sedated than anything at this point. Confused as well.    -depakote stopped   -continue other meds as rx'ed  6. Skin/Wound Care: Routine skin care checks             -sacral skin off-loading/protection   7. Fluids/Electrolytes/Nutrition: Strict Is and Os and follow-up chemistries per nephrology             -renal diet/ 1200 cc FR  - Renvela 800 mg 3 times daily for hyperphosphatemia added per nephrology 8-27   8: Hypotension: monitor TID and prn             -continue midodrine as needed during dialysis  Normotensive, monitor  9-1: Consistently 90s over 60s, has been stable but given findings of microvascular changes on MRI, may be too low for patient to perfuse properly.  Will  add midodrine 5 mg tid and monitor.  DC Coreg as heart rate has been low to normal; may resume at low-dose once blood pressure improved.   9/2 continue with ccurrent regimen. Bp's hr remain low   -elavil wean should help  9/9-10 BP stable, continue midodrine and monitor  9/13 DC midodrine today as BP has been higher, can restart if needed   9: Hyperlipidemia: continue statin   10: CAD s/p CABG 2023: on DAPT and statin.  - troponin elevated but stable, monitor 8/27   11: DM-2: A1c = 6.3% (at home on Lantus 22 units at bedtime, Tradjenta 5 mg daily)        -monitor PO intake             -continue SSI (0-6 units) does not appear to be requiring coverage   - 9/6 DC SSI, change CBGs to daily  9/8 discontinue CBGs  9/12 glucose controlled on CBG   No results for input(s): "GLUCAP" in the last 72 hours.    12: ESRD: HD on Tues, Thurs, Sat -nephrology made aware   - Unable to tolerate 8/27 d/t aggitation, stopped early 8/28;  - attempting again 8/29, stopped early again -Per nephrology, redirectable with gum -Have to switch restraints during dialysis sessions from veil  bed to regular bed, then resume veil bed order once at rehab -9/9 tolerating HD better  9/13 agitated during dialysis yesterday, nephrology feels like he will need a sitter during HD, HD aborted early at 1.5 hours yesterday   13: COPD/OSA; chronic home 2L O2 (Symbicort, Ventolin, prednisone 40 mg daily at home)             -continue symbicort  -Added home oxygen back 8-27   14: GERD: continue Protonix   15: Non-sustained VT/pAF: per cardiology, Dr. Algie Coffer: -continue carvedilol 6.25 mg BID; consider increasing to 12.5 mg BID before starting low dose amiodarone  -consider Eliquis and discontinue asa and Plavix in one week if no active GI bleeding and stable hemoglobin from 8/24 -hemoglobin has remained stable but very low around 7, no recurrent A-fib, monitor for now given agitation/fall 8-31 and consider initiating Eliquis  when safe   -Continue mag-ox to avoid hypomagnesemia -8-27: Single instance of bradycardia this morning 40s, may have been missed read, monitor through today and do not further adjust beta-blocker medications  -9-3 hr controlled after holding coreg -9/4-5 HR stable, continue to monitor -9/9 consider Eliquis this week if agitation continues to improve -9/10 will hold off on Eliquis due to sundowning at night, will see how he does with increase Geodon -9/11 EKG for bradycardia episode today, 2nd degree A-V block mobitz type 1.  Continue to hold beta blocker,9/11 will restart eliquis at 2.5 mg BID dose. Will consult cardiology regarding need for ASA/plavix and coreg.  9/12 appreciate cardiology consult yesterday, DAPT transition to daily aspirin 81 only and Eliquis was discontinued, caution with Geodon as this can contribute to bradycardia 9/13 will decrease Geodon nighttime dose to 20 due to episodes of bradycardia    03/30/2023    5:25 AM 03/30/2023    5:00 AM 03/29/2023    7:51 PM  Vitals with BMI  Weight  216 lbs 15 oz   BMI  37.22   Systolic 144  161  Diastolic 79  90  Pulse 55  70       16: HFrEF: Daily weights. Monitor for signs of overload  - 9/7 continue daily weights, fluid level management per hemodialysis team  Filed Weights   03/24/23 1151 03/29/23 1444 03/30/23 0500  Weight: 97.4 kg 98.7 kg 98.4 kg      17: Liver nodularity on CT scan: LFTs reported to be normal. Avoid hepatotoxic medications    -Abdominal ultrasound earlier this admission with minimal ascites; ammonia within normal limits  -8/31: Did discuss with nephrology whether p.o. lactulose would be beneficial despite normal ammonia, after reviewing records unsure that this would be beneficial    18: Epistaxis - resolved             -previously improved with flonase and saline spray   19: Morbid Obesity. Dietary counseling Body mass index is 39.01 kg/m.      LOS: 18 days A FACE TO FACE EVALUATION WAS  PERFORMED  Fanny Dance 03/30/2023, 1:05 PM

## 2023-03-31 DIAGNOSIS — R4182 Altered mental status, unspecified: Secondary | ICD-10-CM | POA: Diagnosis not present

## 2023-03-31 DIAGNOSIS — D631 Anemia in chronic kidney disease: Secondary | ICD-10-CM | POA: Diagnosis not present

## 2023-03-31 DIAGNOSIS — I120 Hypertensive chronic kidney disease with stage 5 chronic kidney disease or end stage renal disease: Secondary | ICD-10-CM | POA: Diagnosis not present

## 2023-03-31 DIAGNOSIS — N186 End stage renal disease: Secondary | ICD-10-CM | POA: Diagnosis not present

## 2023-03-31 DIAGNOSIS — R5381 Other malaise: Secondary | ICD-10-CM | POA: Diagnosis not present

## 2023-03-31 DIAGNOSIS — N250 Renal osteodystrophy: Secondary | ICD-10-CM | POA: Diagnosis not present

## 2023-03-31 DIAGNOSIS — Z992 Dependence on renal dialysis: Secondary | ICD-10-CM | POA: Diagnosis not present

## 2023-03-31 LAB — MAGNESIUM: Magnesium: 1.6 mg/dL — ABNORMAL LOW (ref 1.7–2.4)

## 2023-03-31 LAB — CBC
HCT: 32.8 % — ABNORMAL LOW (ref 39.0–52.0)
Hemoglobin: 9.6 g/dL — ABNORMAL LOW (ref 13.0–17.0)
MCH: 28.7 pg (ref 26.0–34.0)
MCHC: 29.3 g/dL — ABNORMAL LOW (ref 30.0–36.0)
MCV: 98.2 fL (ref 80.0–100.0)
Platelets: 155 10*3/uL (ref 150–400)
RBC: 3.34 MIL/uL — ABNORMAL LOW (ref 4.22–5.81)
RDW: 18.3 % — ABNORMAL HIGH (ref 11.5–15.5)
WBC: 3.6 10*3/uL — ABNORMAL LOW (ref 4.0–10.5)
nRBC: 0 % (ref 0.0–0.2)

## 2023-03-31 LAB — RENAL FUNCTION PANEL
Albumin: 2.4 g/dL — ABNORMAL LOW (ref 3.5–5.0)
Anion gap: 11 (ref 5–15)
BUN: 12 mg/dL (ref 8–23)
CO2: 29 mmol/L (ref 22–32)
Calcium: 8 mg/dL — ABNORMAL LOW (ref 8.9–10.3)
Chloride: 94 mmol/L — ABNORMAL LOW (ref 98–111)
Creatinine, Ser: 2.13 mg/dL — ABNORMAL HIGH (ref 0.61–1.24)
GFR, Estimated: 32 mL/min — ABNORMAL LOW (ref >60)
Glucose, Bld: 102 mg/dL — ABNORMAL HIGH (ref 70–99)
Phosphorus: <1 mg/dL — CL (ref 2.5–4.6)
Potassium: 2.9 mmol/L — ABNORMAL LOW (ref 3.5–5.1)
Sodium: 134 mmol/L — ABNORMAL LOW (ref 135–145)

## 2023-03-31 MED ORDER — HEPARIN SODIUM (PORCINE) 1000 UNIT/ML DIALYSIS
1000.0000 [IU] | INTRAMUSCULAR | Status: DC | PRN
  Filled 2023-03-31: qty 1

## 2023-03-31 MED ORDER — LORAZEPAM 2 MG/ML IJ SOLN
0.5000 mg | Freq: Once | INTRAMUSCULAR | Status: AC
  Administered 2023-03-31: 0.5 mg via INTRAVENOUS

## 2023-03-31 MED ORDER — QUETIAPINE FUMARATE 25 MG PO TABS
25.0000 mg | ORAL_TABLET | Freq: Every day | ORAL | Status: DC | PRN
  Administered 2023-03-31: 25 mg via ORAL
  Filled 2023-03-31 (×4): qty 1

## 2023-03-31 MED ORDER — HEPARIN SODIUM (PORCINE) 1000 UNIT/ML DIALYSIS
2000.0000 [IU] | Freq: Once | INTRAMUSCULAR | Status: AC
  Administered 2023-03-31: 2000 [IU] via INTRAVENOUS_CENTRAL
  Filled 2023-03-31 (×2): qty 2

## 2023-03-31 NOTE — Progress Notes (Signed)
PROGRESS NOTE   Subjective/Complaints: No new complaints this morning As per nursing was agitated despite ativan, seroquel daily prn ordered as well Family at bedside has no new concerns  ROS: Limited due to cognitive/behavioral   Objective:   No results found. Recent Labs    03/29/23 1529  WBC 4.5  HGB 8.6*  HCT 30.2*  PLT 152     Recent Labs    03/29/23 1528  NA 137  K 4.2  CL 97*  CO2 24  GLUCOSE 107*  BUN 57*  CREATININE 10.88*  CALCIUM 9.0      Intake/Output Summary (Last 24 hours) at 03/31/2023 1431 Last data filed at 03/30/2023 1848 Gross per 24 hour  Intake 59 ml  Output --  Net 59 ml         Physical Exam: Vital Signs Blood pressure (!) 157/74, pulse (!) 30, temperature 97.6 F (36.4 C), temperature source Oral, resp. rate 17, height 5\' 4"  (1.626 m), weight 98.4 kg, SpO2 92%.  Constitutional: No distress . Vital signs reviewed.  Sitting in wheelchair at nurses station HEENT: NCAT, EOMI, oral membranes moist Neck: supple Cardiovascular: Bradycardic Respiratory/Chest: CTA Bilaterally without wheezes or rales. Normal effort    GI/Abdomen: BS +, non-tender, non-distended soft Ext: no clubbing, cyanosis, or edema Psych: pleasant but confused  Neurologic Exam:   Alert.  Drowsy but wakes to voice, follows simple commands, Poor awareness and insight.  Appears calm at the moment Oriented to self, hospital + Memory deficit - ongoing + Poor insight + Mild L ptosis   Sensory exam: Intact to touch in all 4 extremities Motor exam: moves all 4 extremities without limitiations Coordination: Slight bilateral upper extremity tremor, intention, mild.    Musculoskeletal: No joint swelling noted No abnormal tone      Assessment/Plan: 1. Functional deficits which require 15 hr per week of interdisciplinary therapy in a comprehensive inpatient rehab setting. Physiatrist is providing close team  supervision and 24 hour management of active medical problems listed below. Physiatrist and rehab team continue to assess barriers to discharge/monitor patient progress toward functional and medical goals  Care Tool:  Bathing    Body parts bathed by patient: Right arm, Left arm, Chest, Abdomen, Front perineal area, Right upper leg, Buttocks, Left upper leg, Right lower leg, Left lower leg, Face         Bathing assist Assist Level: Moderate Assistance - Patient 50 - 74%     Upper Body Dressing/Undressing Upper body dressing   What is the patient wearing?: Pull over shirt    Upper body assist Assist Level: Minimal Assistance - Patient > 75%    Lower Body Dressing/Undressing      Lower body dressing      What is the patient wearing?: Pants     Lower body assist Assist for lower body dressing: Moderate Assistance - Patient 50 - 74%     Toileting Toileting    Toileting assist Assist for toileting: Moderate Assistance - Patient 50 - 74%     Transfers Chair/bed transfer  Transfers assist     Chair/bed transfer assist level: Minimal Assistance - Patient > 75%     Locomotion  Ambulation   Ambulation assist      Assist level: Minimal Assistance - Patient > 75% Assistive device: No Device Max distance: 175'   Walk 10 feet activity   Assist     Assist level: Contact Guard/Touching assist Assistive device: No Device   Walk 50 feet activity   Assist Walk 50 feet with 2 turns activity did not occur: Safety/medical concerns  Assist level: Minimal Assistance - Patient > 75% Assistive device: No Device    Walk 150 feet activity   Assist Walk 150 feet activity did not occur: Safety/medical concerns  Assist level: Minimal Assistance - Patient > 75% Assistive device: No Device    Walk 10 feet on uneven surface  activity   Assist Walk 10 feet on uneven surfaces activity did not occur: Safety/medical concerns          Wheelchair     Assist Is the patient using a wheelchair?: Yes Type of Wheelchair: Manual    Wheelchair assist level: Dependent - Patient 0%      Wheelchair 50 feet with 2 turns activity    Assist        Assist Level: Dependent - Patient 0%   Wheelchair 150 feet activity     Assist      Assist Level: Dependent - Patient 0%   Blood pressure (!) 157/74, pulse (!) 30, temperature 97.6 F (36.4 C), temperature source Oral, resp. rate 17, height 5\' 4"  (1.626 m), weight 98.4 kg, SpO2 92%.  Medical Problem List and Plan: 1. Functional deficits secondary to metabolic encephalopathy due to sepsis secondary to catheter peritonitis and hypoxemia              -patient may not shower             -ELOS/Goals: 16-18 days, PT/OT Sup, SLP min A -tentative discharge date 9-9           -Continue CIR therapies including PT, OT, and SLP   -Will review records from [facility] -- has worsened since approximately 8/22, possible 8/19, consistent with records from nursing at [facility] with current behavior.    - continue enclosure bed for safety  -  therapy 15/7  -Looking into SNF discharge   2.  Antithrombotics: -DVT/anticoagulation:  Pharmaceutical: Heparin, 03/23/23 changed to Lovenox yesterday to help improved compliance             -antiplatelet therapy: aspirin and Plavix   3. Pain Management: Tylenol as needed             -Lidoderm patches to back  -Elavil 25 mg at bedtime for nerve pain and sleep 8/27  -8/29: Pain well controlled on Elavil; DC lyrica, PRN Tylenol/oxy 2.5-5 mg d/t CKD  8-30: Appears patient has been getting pain medications together overnight along with antipsychotics, will discuss with nursing  8-31: Patient having an difficulty endorsing needs and not utilizing PRNs, scheduled oxycodone 5 mg 3 times daily and has 2.5 mg prn 3 times daily available.   9-1: Not utilizing PRNs, minimal complaints of pain, will discontinue scheduled given lethargy  today..  -muscle rub R shoulder  9/10-13 no reports of pain this morning   4. Mood/Behavior/Sleep: LCSW to evaluate and provide emotional support             -continue Seroquel 50 mg TID             -clonazepam 0.5 mg TID prn             -  Geodon 10 mg IM q 12 hours prn             -antipsychotic agents: n/a  -8-27: Agitated by soft restraints in bed, transition to 4 side rails, Posey alarm, and TeleSitter for now.  Veil bed on standby if needed, however feel we should avoid this if possible due to likelihood of agitating patient further   - 8/28: Placed in veil bed; wrist restraints for dialysis. Scheduled Geodon 20 mg twice daily; discontinue Seroquel and clonazepam due to no perceivable benefit. Add as needed Zyprexa 5 mg twice daily p.o. or IM due to recent cardiac arrhythmias and concern for QTc prolongation. As needed ramelteon for sleep. Stop gabapentin due to possible side effect of agitation  - 8/28: No improvement except temporarily with Geodon; add depakote 125 mg q8h for ongoing aggitation, mood stabilization. Increase elavil to 50 mg at bedtime.  Workup ongoing.    - 8/29: EEG negative, labs normal. MRI pending. Sleeping well and calm since starting depakote and increase in elavil. Will monitor tonight and wean Geodon in AM if appropriate.   8-30: MRI showing chronic microvascular changes, otherwise normal.  Increasingly calm and appropriate during the day, with ongoing agitation at nighttime.  Will increase nighttime Geodon to 40 mg; will discuss sleep log with nursing, as it has not been performed.  8-31 no sleep overnight per sleep log, again getting agitated around 9 PM and 5 AM.  Not responsive to as needed Zyprexa, responsive to Ativan.  Attempted to wean off scheduled a.m. Geodon today, but patient became extremely agitated and unable to redirect, calm down with readministration of medication.  Resume Geodon 20 mg every morning/40 mg every afternoon, increased Elavil to 75 mg  nightly, adding trazodone 50 mg nightly as needed.  P.o./IV Ativan 0.5 mg every 6 hours for agitation.  9-1: slept well overnight with trazodone and increased Elavil, no PRNs used.  Unfortunately, oversedated today.  Discontinue scheduled pain medications as above, start wean of Elavil to 50 mg x2 days -> 25 mg x2 days -> 10 mg x2 days, off due to no apparent benefit and no complaints of pain.  Reduce Geodon from 20 mg every morning/40 mg every evening to 20 mg twice daily.  Continue trazodone as needed for sleep, as this seems to be the difference for a restful night yesterday.  If he remains overly sedated, would dose reduce and DC Depakote next.  Keep Ativan as needed d/t dialysis not having other options available.  9/2-3--non agitated but still sleepy   -dc'ed depakote as I doubt it's doing much at this dose anyway   -continue geodon at 20mg  bid   -scheduled trazodone at HS with back up dose if needed    -sleep was intermittent last night   -requires enclosure bed for safety  9/4 continue enclosure bed for now, consider trying DC of this device early next week  -9/5 appears to be doing better overall this morning, continues to have intermittent agitation at times.  Continue enclosure bed  -9/6 agitation appears to be improving, patient is skeptical about medications and has been declining some of these.  Will discontinue multivitamin, RisaQuad to try to decrease pill burden  -9/7 continues to have intermittent episodes of agitation however overall improving, appears to be taking most of his medications  9/9 agitation and mental status appears to be improving -discussing discontinuation of enclosure bed with team 9/10 Geodon dose increased to 40 mg at night, continue 20 mg in  the morning 9/11 No acute events noted, will check with team regarding DC enclosure bed, nursing reports he did ok last night but had dialysis 9/12 continues to have sundowning/waxing waning confusion and agitation, continue  enclosure bed for now, will caution with Geodon will continue current dose but hold off on any escalation as this can contribute to bradycardia 9/13 increased agitation yesterday, will need to continue enclosure bed, continues to have waxing waning confusion and agitation 5. Neuropsych/cognition: This patient is not capable of making decisions on his own behalf   -recommend neuropsych consult   - 8/27: Ongoing aggitation; Consult hospitalist for further workup on potential contributors to encephalopathy. Pending UA, LA 2.1-> 1.4; otherwise negative so far -Delirium precautions, sleep log, Veiled bed for safety and to minimize restraints -Wife has expressed that she is not interested in palliative care at this time; hopefully with above measures patient can get dialysis in the next 48 hours to limit worsening uremic encephalopathy and medical decline    - 8/29: Aggitation much improved, able to get some dialysis last night and today. See above for medication changes. Calling wife tonight to discuss.   8-30: Still with moderate cognitive deficits, however appears once patient is less agitated he is better able to concentrate and has better orientation.  MRI negative for acute findings, monitor.  9/3 agitation better. More sedated than anything at this point. Confused as well.    -depakote stopped   -continue other meds as rx'ed  6. Skin/Wound Care: Routine skin care checks             -sacral skin off-loading/protection   7. Fluids/Electrolytes/Nutrition: Strict Is and Os and follow-up chemistries per nephrology             -renal diet/ 1200 cc FR  - Renvela 800 mg 3 times daily for hyperphosphatemia added per nephrology 8-27   8: Hypotension: monitor TID and prn             -continue midodrine as needed during dialysis  Normotensive, monitor  9-1: Consistently 90s over 60s, has been stable but given findings of microvascular changes on MRI, may be too low for patient to perfuse properly.  Will  add midodrine 5 mg tid and monitor.  DC Coreg as heart rate has been low to normal; may resume at low-dose once blood pressure improved.   9/2 continue with ccurrent regimen. Bp's hr remain low   -elavil wean should help  9/9-10 BP stable, continue midodrine and monitor  9/13 DC midodrine today as BP has been higher, can restart if needed   9: Hyperlipidemia: continue statin   10: CAD s/p CABG 2023: on DAPT and statin.  - troponin elevated but stable, monitor 8/27   11: DM-2: A1c = 6.3% (at home on Lantus 22 units at bedtime, Tradjenta 5 mg daily)        -monitor PO intake             -continue SSI (0-6 units) does not appear to be requiring coverage   - 9/6 DC SSI, change CBGs to daily  9/8 discontinue CBGs  9/12 glucose controlled on CBG   No results for input(s): "GLUCAP" in the last 72 hours.    12: ESRD: HD on Tues, Thurs, Sat -nephrology made aware   - Unable to tolerate 8/27 d/t aggitation, stopped early 8/28;  - attempting again 8/29, stopped early again -Per nephrology, redirectable with gum -Have to switch restraints during dialysis sessions from veil  bed to regular bed, then resume veil bed order once at rehab -9/9 tolerating HD better  9/13 agitated during dialysis yesterday, nephrology feels like he will need a sitter during HD, HD aborted early at 1.5 hours yesterday   13: COPD/OSA; chronic home 2L O2 (Symbicort, Ventolin, prednisone 40 mg daily at home)             -continue symbicort  -Added home oxygen back 8-27   14: GERD: continue Protonix   15: Non-sustained VT/pAF: per cardiology, Dr. Algie Coffer: -continue carvedilol 6.25 mg BID; consider increasing to 12.5 mg BID before starting low dose amiodarone  -consider Eliquis and discontinue asa and Plavix in one week if no active GI bleeding and stable hemoglobin from 8/24 -hemoglobin has remained stable but very low around 7, no recurrent A-fib, monitor for now given agitation/fall 8-31 and consider initiating Eliquis  when safe   -Continue mag-ox to avoid hypomagnesemia -8-27: Single instance of bradycardia this morning 40s, may have been missed read, monitor through today and do not further adjust beta-blocker medications  -9-3 hr controlled after holding coreg -9/4-5 HR stable, continue to monitor -9/9 consider Eliquis this week if agitation continues to improve -9/10 will hold off on Eliquis due to sundowning at night, will see how he does with increase Geodon -9/11 EKG for bradycardia episode today, 2nd degree A-V block mobitz type 1.  Continue to hold beta blocker,9/11 will restart eliquis at 2.5 mg BID dose. Will consult cardiology regarding need for ASA/plavix and coreg.  9/12 appreciate cardiology consult yesterday, DAPT transition to daily aspirin 81 only and Eliquis was discontinued, caution with Geodon as this can contribute to bradycardia 9/13 will decrease Geodon nighttime dose to 20 due to episodes of bradycardia    03/31/2023    2:16 PM 03/31/2023    2:10 PM 03/31/2023    2:07 PM  Vitals with BMI  Systolic  157 151  Diastolic  74 79  Pulse 30 61 61       16: HFrEF: Daily weights. Monitor for signs of overload  - 9/7 continue daily weights, fluid level management per hemodialysis team Messaged nursing to please weigh patient  Filed Weights   03/24/23 1151 03/29/23 1444 03/30/23 0500  Weight: 97.4 kg 98.7 kg 98.4 kg      17: Liver nodularity on CT scan: LFTs reported to be normal. Avoid hepatotoxic medications    -Abdominal ultrasound earlier this admission with minimal ascites; ammonia within normal limits  -8/31: Did discuss with nephrology whether p.o. lactulose would be beneficial despite normal ammonia, after reviewing records unsure that this would be beneficial    18: Epistaxis - resolved             -previously improved with flonase and saline spray   19: Morbid Obesity. Dietary counseling Body mass index is 39.01 kg/m.  20. Bradycardic: continue to monitor HR  TID  21. HTN: plan for HD today.   22. Agitation: daily prn seroquel added      LOS: 19 days A FACE TO FACE EVALUATION WAS PERFORMED  Drema Pry Nickey Canedo 03/31/2023, 2:31 PM

## 2023-03-31 NOTE — Progress Notes (Addendum)
Waking up at 2100 this evening. Observed responding and talking to self. Also, responding to hallucinations in the room. Disoriented and confused upon waking up. Irritable edge. Remains calm and cooperative. Demonstrating no aggressive behavior. Able to be redirected. With staff interaction able to self-soothe on his own. Was disoriented x 3 outside of his name. Believed he was in a "truck" during this time. Not wanting to return back to bed did agree to sit in the wheelchair. Moved around the unit in the wheelchair and then to the nurses station. Taking medications without any issues. Patient missing his significant other. Assisted in calling his SO. Returned back to bed. Continues to be restless. Charge Nurse talking with patient and patient remains calm & cooperative. Did rest in bed briefly.  However, around midnight waking up again responding to self and calling for staff. Continues to demonstrate episodes of tachypnea. Not wanting head of bed raised preferred position his prone or lateral when lying in bed. Patient trying to sit up on the edge of the bed. Assisted patient sitting up on the edge of the bed. Explains difficulty catching his breath at times. Demonstrated an increase with respirations at 0015. Charge RN called to assist. Stayed with patient and talked to him during this time. Vitals rechecked at 0027 is demonstrating Green MEWS. Patient not wanting to return to his room at this time. Wanting to go home. Sat with patient for some time and talked. Offered food and fluid. Endorses not eating dinner earlier is hungry not wanting food offered throughout the evening so far. Continues to not want anything to drink only taking small sips of water mainly with medications. Expresses being cold given warm blankets. Resting at this time in the wheelchair. Continues to remain calm and cooperative.

## 2023-03-31 NOTE — Progress Notes (Signed)
Brief nephrology note: Lab resulted late. K and phos low. It seems like there is lab error because the labs results are not consistent with the previous one. Spoke with HD nurse who thinks that it might have drawn 15-20 min into the dialysis. In HD now, clinically stable except chronic agitation. Will repeat lab in am. Hold phoslo until then.  Crista Elliot, MD [facility]>

## 2023-03-31 NOTE — Progress Notes (Signed)
HD Progress Note:   03/31/23 1810  Vitals  Temp 97.9 F (36.6 C)  Temp Source Oral  BP (!) 151/87  MAP (mmHg) 104  BP Location Right Arm  BP Method Automatic  Patient Position (if appropriate) Lying  Pulse Rate 91  Pulse Rate Source Monitor  ECG Heart Rate 83  Resp (!) 23  Oxygen Therapy  SpO2 95 %  O2 Device Room Air  Patient Activity (if Appropriate) In bed  Pulse Oximetry Type Continuous  Oximetry Probe Site Changed No  During Treatment Monitoring  HD Safety Checks Performed Yes  Intra-Hemodialysis Comments Tx completed;Tolerated well  Dialysis Fluid Bolus Normal Saline  Bolus Amount (mL) 300 mL  Post Treatment  Dialyzer Clearance Clear  Hemodialysis Intake (mL) 0 mL  Liters Processed 64  Fluid Removed (mL) 2900 mL  Tolerated HD Treatment Yes  Post-Hemodialysis Comments Completed HD without acute issues/complication except his behavioral issues.  Note  Patient Observations Patient becoming agitated again, can't wait to be taken out, wanting to take out his needles out. Terminated treatment early.

## 2023-03-31 NOTE — Progress Notes (Signed)
Physical Therapy Session Note  Patient Details  Name: [name] [name] MRN: [id number] Date of Birth: 1950-10-27  Today's Date: 03/31/2023 PT Individual Time: 1050-1119 PT Individual Time Calculation (min): 29 min  and Today's Date: 03/31/2023 PT Missed Time: 31 Minutes Missed Time Reason: Other (Comment) (scheduling error & pt meal)  Short Term Goals: Week 2:  PT Short Term Goal 1 (Week 2): STGs = LTGs Week 3:  PT Short Term Goal 1 (Week 3): STG = LTG due to ELOS  Skilled Therapeutic Interventions/Progress Updates:    Session started late d/t pt given time to finish breakfast at beginning of session.  Pt received and maintained on room air up in chair, alarm activated, wife present. 54bpm palpated by PT(manual palpation of HR throughout session d/t inconsistent reading on dynamap), 93% O2 sitting at beginning of session.  STS CGA from [facility] throughout session, using R HHA.  Gait training for endurance: 93 ft R HHA min A, w/ intermittent L UE support on railing/furniture near by. 87% O2 post walk, brought up to 93% O2, 98bpm, in sitting. 158 ft R HHA min A w/ continued intermittent L UE support. 90% O2, 70bpm in sitting. Pt requires intermittent verbal cues to pick up is feet d/t his decreased step height and step length. Pt also demo decreased velocity and intermittent anterior LOB w/ fatigue.   Pt dependently transported to main gym for time management and energy conservation.  Stair training: Min A required, 4 stairs 6" BHR, alternating pattern. 85% O2, 69bpm in sitting. Pt reports increased difficulty completing task and demo increased fatigue.  Pt dependently transported back room d/t fatigue and lack of time.  Pt left in bed, alarm activated, wife present, all needs met. Left on room air.  Therapy Documentation Precautions:  Precautions Precautions: Fall Restrictions Weight Bearing Restrictions: No Pain:   Pt reports no pain during session  Therapy/Group: Individual  Therapy  Gilman Buttner 03/31/2023, 12:08 PM

## 2023-03-31 NOTE — Progress Notes (Signed)
[facility] Progress Note   Subjective:  Seen in room - calm this AM. Per notes, confusion/sundowning overnight. For HD today. Midodrine 5mg  TID has been stopped - BP holding.  Objective Vitals:   03/30/23 2057 03/31/23 0015 03/31/23 0027 03/31/23 0650  BP: (!) 145/81  (!) 147/109 132/65  Pulse: 85 (!) 36 (!) 52 (!) 55  Resp: 20 (!) 36 18 (!) 24  Temp: 98.2 F (36.8 C)  98.1 F (36.7 C) 97.6 F (36.4 C)  TempSrc: Oral   Axillary  SpO2: 91% 94% 94% 93%  Weight:      Height:       Physical Exam General: Confused appearing man, NAD. Room air. Heart:RRR; no murmur Lungs: CTAB; no rales Abdomen: soft, non-tender Extremities: no LE edema Dialysis Access: LUE AVF + bruit  Additional Objective Labs: Basic Metabolic Panel: Recent Labs  Lab 03/29/23 1528  NA 137  K 4.2  CL 97*  CO2 24  GLUCOSE 107*  BUN 57*  CREATININE 10.88*  CALCIUM 9.0  PHOS 2.6   Liver Function Tests: Recent Labs  Lab 03/29/23 1528  ALBUMIN 2.1*   CBC: Recent Labs  Lab 03/29/23 1529  WBC 4.5  HGB 8.6*  HCT 30.2*  MCV 97.1  PLT 152   Medications:   (feeding supplement) PROSource Plus  30 mL Oral BID BM   aspirin EC  81 mg Oral Daily   atorvastatin  80 mg Oral Daily   darbepoetin (ARANESP) injection - DIALYSIS  150 mcg Subcutaneous Q Sun-1800   diphenhydrAMINE  25 mg Intravenous Once   divalproex  125 mg Oral Q8H   feeding supplement  237 mL Oral BID WC   Gerhardt's butt cream  1 Application Topical QID   lidocaine  1 patch Transdermal Q2200   lidocaine  1 patch Transdermal Q24H   magnesium oxide  400 mg Oral Daily   mometasone-formoterol  2 puff Inhalation BID   pantoprazole  40 mg Oral BID   sevelamer carbonate  800 mg Oral TID WC   traZODone  50 mg Oral QHS   ziprasidone  20 mg Oral Daily   ziprasidone  20 mg Oral Daily    Dialysis Orders: Previously on [facility]D thru [facility] - changed to HD - will need outpatient HD unit established prior to d/c. 4hr,  3K, EDW ~96, L AVF   Assessment/Plan: 1. Debility - in CIR 2. Encephalopathy/agitation: Waxing/waning agitation, unclear etiology. Ammonia fine and brain MRI with no acute findings. Disruptive again during HD on 9/12. 3. ESRD: Transitioned from PD to HD recently on TTS schedule. Using old AVF which is still functional, s/p f'gram 02/28/23.  For HD today. Will get CBC/RFP/Mg levels 4. Hypotension/volume: BP higher, mido 5mg  TID stopped 9/13 - follow. 5. Anemia: Hgb 8.6 - improving on Aranesp weekly. Last Tsat 38% with ferritin 2175.  No iron.  6. Metabolic bone disease: CorrCa on high side, Phos good. Continue Renvela 1 AC TID, no VDRA. 7.  Nutrition:  Alb very low, continue supps.  Renal diet w/fluid restrictions.  8.  Hx NSVT: On Coreg 10.  Recent PD related peritonitis and related fungemia/ enterococcus bacteremia - underwent PD cath removal, TDC placement and HD cath placement and line holiday. Completed IV unasyn, IV micafungin and cipro on 8/15. TDC not replaced cause he had functional AVF.   11. Dispo: Waxing/waning behavioral issues. Will need to be able to calmly sit through HD for a full outpatient treatment without trying to  stand for his own safety. Will need sitter as outpatient - apparently his wife is willing.  Ozzie Hoyle, PA-C 03/31/2023, 9:37 AM  [employer]

## 2023-03-31 NOTE — Progress Notes (Signed)
Date and time results received: 03/31/23 1530 (use smartphrase ".now" to insert current time)  Test: Phosphorus  Critical Value: <1.0  Name of Provider Notified: Tommi Emery, PA  Orders Received? Or Actions Taken?: Orders Received - See Orders for details

## 2023-04-01 DIAGNOSIS — Z992 Dependence on renal dialysis: Secondary | ICD-10-CM | POA: Diagnosis not present

## 2023-04-01 DIAGNOSIS — N186 End stage renal disease: Secondary | ICD-10-CM | POA: Diagnosis not present

## 2023-04-01 DIAGNOSIS — R5381 Other malaise: Secondary | ICD-10-CM | POA: Diagnosis not present

## 2023-04-01 DIAGNOSIS — I120 Hypertensive chronic kidney disease with stage 5 chronic kidney disease or end stage renal disease: Secondary | ICD-10-CM | POA: Diagnosis not present

## 2023-04-01 DIAGNOSIS — D631 Anemia in chronic kidney disease: Secondary | ICD-10-CM | POA: Diagnosis not present

## 2023-04-01 DIAGNOSIS — R4182 Altered mental status, unspecified: Secondary | ICD-10-CM | POA: Diagnosis not present

## 2023-04-01 DIAGNOSIS — N250 Renal osteodystrophy: Secondary | ICD-10-CM | POA: Diagnosis not present

## 2023-04-01 LAB — RENAL FUNCTION PANEL
Albumin: 2.1 g/dL — ABNORMAL LOW (ref 3.5–5.0)
Anion gap: 8 (ref 5–15)
BUN: 37 mg/dL — ABNORMAL HIGH (ref 8–23)
CO2: 29 mmol/L (ref 22–32)
Calcium: 8.7 mg/dL — ABNORMAL LOW (ref 8.9–10.3)
Chloride: 97 mmol/L — ABNORMAL LOW (ref 98–111)
Creatinine, Ser: 8.09 mg/dL — ABNORMAL HIGH (ref 0.61–1.24)
GFR, Estimated: 7 mL/min — ABNORMAL LOW (ref >60)
Glucose, Bld: 105 mg/dL — ABNORMAL HIGH (ref 70–99)
Phosphorus: 2.8 mg/dL (ref 2.5–4.6)
Potassium: 4 mmol/L (ref 3.5–5.1)
Sodium: 134 mmol/L — ABNORMAL LOW (ref 135–145)

## 2023-04-01 NOTE — Progress Notes (Signed)
Occupational Therapy Note  Patient Details  Name: SHAMS DOZOIS MRN: [id number] Date of Birth: 17-Jan-1951  Today's Date: 04/01/2023 OT Missed Time: 45 Minutes Missed Time Reason: Patient unwilling/refused to participate without medical reason;Other (comment) (sleeping and did not want to be disturbed)  Pt sleeping upon arrival. Once aroused, pt adamant that he was not going to do anything. Pt remained in bed with bed alarm activated. All needs within reach. Will attempt to see later as schedule allows.    Lavone Neri [facility] 04/01/2023, 10:05 AM

## 2023-04-01 NOTE — Progress Notes (Signed)
Physical Therapy Session Note  Patient Details  Name: MCDANIEL STONEBURG MRN: [id number] Date of Birth: [birth year]  Today's Date: 04/01/2023   Short Term Goals: Week 3:  PT Short Term Goal 1 (Week 3): STG = LTG due to ELOS  Skilled Therapeutic Interventions/Progress Updates:    Attempted to see pt for 7am scheduled session but pt asleep, difficult to arouse. When I was able to wake him, he yelled out "get out of here". Offered to assist with getting dressed and eating breakfast to start the day, pt grumbled and closed eyes again despite multiple attempts to wake him. Pt with wheezing sound to his breathing while sleeping. Checked O2 = 97% and pt connected to wall with O2 on 2 L. Will re-attempt later as schedule allows to make up time.   Attempted again at 845am. Pt still fast sleep, only grunting when awoken. Refused breakfast offer or to get up and get dressed and returned back to sleep. Will re-attempt again if schedule allows.   Pt missed 60 min of skilled PT.  Therapy Documentation Precautions:  Precautions Precautions: Fall Restrictions Weight Bearing Restrictions: No General: PT Amount of Missed Time (min): 60 Minutes PT Missed Treatment Reason: Patient unwilling to participate  Karolee Stamps Darrol Poke, PT, DPT, CBIS  04/01/2023, 12:44 PM

## 2023-04-01 NOTE — Progress Notes (Signed)
Occupational Therapy Session Note  Patient Details  Name: [name] [name] MRN: [id number] Date of Birth: Oct 22, 1950  Today's Date: 04/01/2023 OT Individual Time: 1115-1200 OT Individual Time Calculation (min): 45 min    Short Term Goals: Week 3:  OT Short Term Goal 1 (Week 3): LTG=STG 2/2 ELOS  Skilled Therapeutic Interventions/Progress Updates:      Therapy Documentation Precautions:  Precautions Precautions: Fall Restrictions Weight Bearing Restrictions: No General: "I feel like a new man!" Pt seated EOB with nurse upon OT arrival, agreeable to OT session. Pt presenting with slight wheezing, shakiness and slight confusion with decreased sitting balance, on 1L O2. Vitals WNL. Nursing aware of changes.   Pain: no pain reported  ADL: Grooming: SBA seated in W/C at sink for washing face  Oral hygiene: SBA for brushing teeth seated at sink, able to manipulate containers UB dressing: Min A, assistance with pulling down back d/t decreased sitting balance this date seated EOB LB dressing: Min A, assistance with threading over feet, able to pull up on lower legs and manage over hips in standing Footwear: total A for donning socks Transfers: CGA for squat pivot transfer d/t fatigue this date from bed>W/C, CGA with intermittent Min A for static seated balance on EOB this date   ADL tasks requiring increased time this date d/t fatigue.  Pt seated in W/C at end of session with W/C alarm donned, call light within reach and 4Ps assessed.    Therapy/Group: Individual Therapy  Velia Meyer, OTD, OTR/L 04/01/2023, 4:01 PM

## 2023-04-01 NOTE — Progress Notes (Signed)
PROGRESS NOTE   Subjective/Complaints: Sleepy this morning Refused OT Breathing comfortably Was able to receive nearly all dialysis yesterday   ROS: Limited due to cognitive/behavioral   Objective:   No results found. Recent Labs    03/29/23 1529 03/31/23 1420  WBC 4.5 3.6*  HGB 8.6* 9.6*  HCT 30.2* 32.8*  PLT 152 155     Recent Labs    03/31/23 1420 04/01/23 0816  NA 134* 134*  K 2.9* 4.0  CL 94* 97*  CO2 29 29  GLUCOSE 102* 105*  BUN 12 37*  CREATININE 2.13* 8.09*  CALCIUM 8.0* 8.7*      Intake/Output Summary (Last 24 hours) at 04/01/2023 1140 Last data filed at 03/31/2023 1810 Gross per 24 hour  Intake --  Output 2900 ml  Net -2900 ml         Physical Exam: Vital Signs Blood pressure 133/81, pulse 80, temperature 97.6 F (36.4 C), resp. rate 18, height 5\' 4"  (1.626 m), weight 98.1 kg, SpO2 99%.  Gen: no distress, normal appearing HEENT: oral mucosa pink and moist, NCAT Cardio: Reg rate Chest: normal effort, normal rate of breathing Abd: soft, non-distended Ext: no edema Psych: pleasant, normal affect Skin: intact  Ext: no clubbing, cyanosis, or edema Psych: pleasant but confused  Neurologic Exam:   Alert.  Drowsy but wakes to voice, follows simple commands, Poor awareness and insight.  Appears calm at the moment Oriented to self, hospital + Memory deficit - ongoing + Poor insight + Mild L ptosis   Sensory exam: Intact to touch in all 4 extremities Motor exam: moves all 4 extremities without limitiations Coordination: Slight bilateral upper extremity tremor, intention, mild.    Musculoskeletal: No joint swelling noted No abnormal tone      Assessment/Plan: 1. Functional deficits which require 15 hr per week of interdisciplinary therapy in a comprehensive inpatient rehab setting. Physiatrist is providing close team supervision and 24 hour management of active medical problems  listed below. Physiatrist and rehab team continue to assess barriers to discharge/monitor patient progress toward functional and medical goals  Care Tool:  Bathing    Body parts bathed by patient: Right arm, Left arm, Chest, Abdomen, Front perineal area, Right upper leg, Buttocks, Left upper leg, Right lower leg, Left lower leg, Face         Bathing assist Assist Level: Moderate Assistance - Patient 50 - 74%     Upper Body Dressing/Undressing Upper body dressing   What is the patient wearing?: Pull over shirt    Upper body assist Assist Level: Minimal Assistance - Patient > 75%    Lower Body Dressing/Undressing      Lower body dressing      What is the patient wearing?: Pants     Lower body assist Assist for lower body dressing: Moderate Assistance - Patient 50 - 74%     Toileting Toileting    Toileting assist Assist for toileting: Moderate Assistance - Patient 50 - 74%     Transfers Chair/bed transfer  Transfers assist     Chair/bed transfer assist level: Minimal Assistance - Patient > 75%     Locomotion Ambulation   Ambulation assist  Assist level: Minimal Assistance - Patient > 75% Assistive device: No Device Max distance: 175'   Walk 10 feet activity   Assist     Assist level: Contact Guard/Touching assist Assistive device: No Device   Walk 50 feet activity   Assist Walk 50 feet with 2 turns activity did not occur: Safety/medical concerns  Assist level: Minimal Assistance - Patient > 75% Assistive device: No Device    Walk 150 feet activity   Assist Walk 150 feet activity did not occur: Safety/medical concerns  Assist level: Minimal Assistance - Patient > 75% Assistive device: No Device    Walk 10 feet on uneven surface  activity   Assist Walk 10 feet on uneven surfaces activity did not occur: Safety/medical concerns         Wheelchair     Assist Is the patient using a wheelchair?: Yes Type of Wheelchair:  Manual    Wheelchair assist level: Dependent - Patient 0%      Wheelchair 50 feet with 2 turns activity    Assist        Assist Level: Dependent - Patient 0%   Wheelchair 150 feet activity     Assist      Assist Level: Dependent - Patient 0%   Blood pressure 133/81, pulse 80, temperature 97.6 F (36.4 C), resp. rate 18, height 5\' 4"  (1.626 m), weight 98.1 kg, SpO2 99%.  Medical Problem List and Plan: 1. Functional deficits secondary to metabolic encephalopathy due to sepsis secondary to catheter peritonitis and hypoxemia              -patient may not shower             -ELOS/Goals: 16-18 days, PT/OT Sup, SLP min A -tentative discharge date 9-9           -Continue CIR therapies including PT, OT, and SLP   -Will review records from [facility] -- has worsened since approximately 8/22, possible 8/19, consistent with records from nursing at [facility] with current behavior.    - continue enclosure bed for safety  -  therapy 15/7  -Looking into SNF discharge   2.  Antithrombotics: -DVT/anticoagulation:  Pharmaceutical: Heparin, 03/23/23 changed to Lovenox yesterday to help improved compliance             -antiplatelet therapy: aspirin and Plavix   3. Pain Management: Tylenol as needed             -Lidoderm patches to back  -Elavil 25 mg at bedtime for nerve pain and sleep 8/27  -8/29: Pain well controlled on Elavil; DC lyrica, PRN Tylenol/oxy 2.5-5 mg d/t CKD  8-30: Appears patient has been getting pain medications together overnight along with antipsychotics, will discuss with nursing  8-31: Patient having an difficulty endorsing needs and not utilizing PRNs, scheduled oxycodone 5 mg 3 times daily and has 2.5 mg prn 3 times daily available.   9-1: Not utilizing PRNs, minimal complaints of pain, will discontinue scheduled given lethargy today..  -muscle rub R shoulder  9/10-13 no reports of pain this morning   4. Mood/Behavior/Sleep: LCSW to evaluate and provide  emotional support             -continue Seroquel 50 mg TID             -clonazepam 0.5 mg TID prn             -Geodon 10 mg IM q 12 hours prn             -  antipsychotic agents: n/a  -8-27: Agitated by soft restraints in bed, transition to 4 side rails, Posey alarm, and TeleSitter for now.  Veil bed on standby if needed, however feel we should avoid this if possible due to likelihood of agitating patient further   - 8/28: Placed in veil bed; wrist restraints for dialysis. Scheduled Geodon 20 mg twice daily; discontinue Seroquel and clonazepam due to no perceivable benefit. Add as needed Zyprexa 5 mg twice daily p.o. or IM due to recent cardiac arrhythmias and concern for QTc prolongation. As needed ramelteon for sleep. Stop gabapentin due to possible side effect of agitation  - 8/28: No improvement except temporarily with Geodon; add depakote 125 mg q8h for ongoing aggitation, mood stabilization. Increase elavil to 50 mg at bedtime.  Workup ongoing.    - 8/29: EEG negative, labs normal. MRI pending. Sleeping well and calm since starting depakote and increase in elavil. Will monitor tonight and wean Geodon in AM if appropriate.   8-30: MRI showing chronic microvascular changes, otherwise normal.  Increasingly calm and appropriate during the day, with ongoing agitation at nighttime.  Will increase nighttime Geodon to 40 mg; will discuss sleep log with nursing, as it has not been performed.  8-31 no sleep overnight per sleep log, again getting agitated around 9 PM and 5 AM.  Not responsive to as needed Zyprexa, responsive to Ativan.  Attempted to wean off scheduled a.m. Geodon today, but patient became extremely agitated and unable to redirect, calm down with readministration of medication.  Resume Geodon 20 mg every morning/40 mg every afternoon, increased Elavil to 75 mg nightly, adding trazodone 50 mg nightly as needed.  P.o./IV Ativan 0.5 mg every 6 hours for agitation.  9-1: slept well overnight with  trazodone and increased Elavil, no PRNs used.  Unfortunately, oversedated today.  Discontinue scheduled pain medications as above, start wean of Elavil to 50 mg x2 days -> 25 mg x2 days -> 10 mg x2 days, off due to no apparent benefit and no complaints of pain.  Reduce Geodon from 20 mg every morning/40 mg every evening to 20 mg twice daily.  Continue trazodone as needed for sleep, as this seems to be the difference for a restful night yesterday.  If he remains overly sedated, would dose reduce and DC Depakote next.  Keep Ativan as needed d/t dialysis not having other options available.  9/2-3--non agitated but still sleepy   -dc'ed depakote as I doubt it's doing much at this dose anyway   -continue geodon at 20mg  bid   -scheduled trazodone at HS with back up dose if needed    -sleep was intermittent last night   -requires enclosure bed for safety  9/4 continue enclosure bed for now, consider trying DC of this device early next week  -9/5 appears to be doing better overall this morning, continues to have intermittent agitation at times.  Continue enclosure bed  -9/6 agitation appears to be improving, patient is skeptical about medications and has been declining some of these.  Will discontinue multivitamin, RisaQuad to try to decrease pill burden  -9/7 continues to have intermittent episodes of agitation however overall improving, appears to be taking most of his medications  9/9 agitation and mental status appears to be improving -discussing discontinuation of enclosure bed with team 9/10 Geodon dose increased to 40 mg at night, continue 20 mg in the morning 9/11 No acute events noted, will check with team regarding DC enclosure bed, nursing reports he did ok  last night but had dialysis 9/12 continues to have sundowning/waxing waning confusion and agitation, continue enclosure bed for now, will caution with Geodon will continue current dose but hold off on any escalation as this can contribute to  bradycardia 9/13 increased agitation yesterday, will need to continue enclosure bed, continues to have waxing waning confusion and agitation 5. Neuropsych/cognition: This patient is not capable of making decisions on his own behalf   -recommend neuropsych consult   - 8/27: Ongoing aggitation; Consult hospitalist for further workup on potential contributors to encephalopathy. Pending UA, LA 2.1-> 1.4; otherwise negative so far -Delirium precautions, sleep log, Veiled bed for safety and to minimize restraints -Wife has expressed that she is not interested in palliative care at this time; hopefully with above measures patient can get dialysis in the next 48 hours to limit worsening uremic encephalopathy and medical decline    - 8/29: Aggitation much improved, able to get some dialysis last night and today. See above for medication changes. Calling wife tonight to discuss.   8-30: Still with moderate cognitive deficits, however appears once patient is less agitated he is better able to concentrate and has better orientation.  MRI negative for acute findings, monitor.  9/3 agitation better. More sedated than anything at this point. Confused as well.    -depakote stopped   -continue other meds as rx'ed  6. Skin/Wound Care: Routine skin care checks             -sacral skin off-loading/protection   7. Fluids/Electrolytes/Nutrition: Strict Is and Os and follow-up chemistries per nephrology             -renal diet/ 1200 cc FR  - Renvela 800 mg 3 times daily for hyperphosphatemia added per nephrology 8-27   8: Hypotension: monitor TID and prn             -continue midodrine as needed during dialysis  Normotensive, monitor  9-1: Consistently 90s over 60s, has been stable but given findings of microvascular changes on MRI, may be too low for patient to perfuse properly.  Will add midodrine 5 mg tid and monitor.  DC Coreg as heart rate has been low to normal; may resume at low-dose once blood pressure  improved.   9/2 continue with ccurrent regimen. Bp's hr remain low   -elavil wean should help  9/9-10 BP stable, continue midodrine and monitor  9/13 DC midodrine today as BP has been higher, can restart if needed   9: Hyperlipidemia: continue statin   10: CAD s/p CABG 2023: on DAPT and statin.  - troponin elevated but stable, monitor 8/27   11: DM-2: A1c = 6.3% (at home on Lantus 22 units at bedtime, Tradjenta 5 mg daily)        -monitor PO intake             -continue SSI (0-6 units) does not appear to be requiring coverage   - 9/6 DC SSI, change CBGs to daily  9/8 discontinue CBGs  9/12 glucose controlled on CBG   No results for input(s): "GLUCAP" in the last 72 hours.    12: ESRD: HD on Tues, Thurs, Sat -nephrology made aware   - Unable to tolerate 8/27 d/t aggitation, stopped early 8/28;  - attempting again 8/29, stopped early again -Per nephrology, redirectable with gum -Have to switch restraints during dialysis sessions from veil bed to regular bed, then resume veil bed order once at rehab -9/9 tolerating HD better  9/13 agitated during  dialysis yesterday, nephrology feels like he will need a sitter during HD, HD aborted early at 1.5 hours yesterday   13: COPD/OSA; chronic home 2L O2 (Symbicort, Ventolin, prednisone 40 mg daily at home)             -continue symbicort  -Added home oxygen back 8-27   14: GERD: continue Protonix   15: Non-sustained VT/pAF: per cardiology, Dr. Algie Coffer: -continue carvedilol 6.25 mg BID; consider increasing to 12.5 mg BID before starting low dose amiodarone  -consider Eliquis and discontinue asa and Plavix in one week if no active GI bleeding and stable hemoglobin from 8/24 -hemoglobin has remained stable but very low around 7, no recurrent A-fib, monitor for now given agitation/fall 8-31 and consider initiating Eliquis when safe   -Continue mag-ox to avoid hypomagnesemia -8-27: Single instance of bradycardia this morning 40s, may have been  missed read, monitor through today and do not further adjust beta-blocker medications  -9-3 hr controlled after holding coreg -9/4-5 HR stable, continue to monitor -9/9 consider Eliquis this week if agitation continues to improve -9/10 will hold off on Eliquis due to sundowning at night, will see how he does with increase Geodon -9/11 EKG for bradycardia episode today, 2nd degree A-V block mobitz type 1.  Continue to hold beta blocker,9/11 will restart eliquis at 2.5 mg BID dose. Will consult cardiology regarding need for ASA/plavix and coreg.  9/12 appreciate cardiology consult yesterday, DAPT transition to daily aspirin 81 only and Eliquis was discontinued, caution with Geodon as this can contribute to bradycardia 9/13 will decrease Geodon nighttime dose to 20 due to episodes of bradycardia    04/01/2023    8:42 AM 04/01/2023    6:41 AM 04/01/2023    3:53 AM  Vitals with BMI  Weight   216 lbs 4 oz  BMI   37.1  Systolic  133   Diastolic  81   Pulse 80 85        16: HFrEF: Daily weights. Monitor for signs of overload  - 9/7 continue daily weights, fluid level management per hemodialysis team Weight reviewed and stable  Filed Weights   03/31/23 1405 03/31/23 1820 04/01/23 0353  Weight: 100.7 kg 97.8 kg 98.1 kg      17: Liver nodularity on CT scan: LFTs reported to be normal. Avoid hepatotoxic medications    -Abdominal ultrasound earlier this admission with minimal ascites; ammonia within normal limits  -8/31: Did discuss with nephrology whether p.o. lactulose would be beneficial despite normal ammonia, after reviewing records unsure that this would be beneficial    18: Epistaxis - resolved             -previously improved with flonase and saline spray   19: Morbid Obesity. Dietary counseling Body mass index is 39.01 kg/m.  20. Bradycardic: continue to monitor HR TID  21. HTN: resolved after HD, continue to monitor TID  22. Agitation: daily prn seroquel added, continue       LOS: 20 days A FACE TO FACE EVALUATION WAS PERFORMED  [name] [name] 04/01/2023, 11:40 AM

## 2023-04-01 NOTE — Progress Notes (Signed)
[facility] Progress Note   Subjective:   Seen in room - bed pushed up against wall for his safety. He is breathing comfortably, only grunts responses to my questions. Dialyzed yesterday - 2.9L off. Not agitated at the very end - did get nearly all of his dialysis treatment. Labs from 9/14 at 1420 were drawn during dialysis and should be disregarded.  Objective Vitals:   03/31/23 2000 04/01/23 0353 04/01/23 0641 04/01/23 0842  BP: (!) 157/95  133/81   Pulse: 95  85 80  Resp: 17   18  Temp: 97.6 F (36.4 C)  97.6 F (36.4 C)   TempSrc:      SpO2: 93%  99%   Weight:  98.1 kg    Height:       Physical Exam General: Drowsy man, NAD. Breathing comfortably Heart: RRR; no murmur Lungs: Occ rhonchi Abdomen: soft Extremities: trace BLE edema Dialysis Access: LUE AVG + bruit  Additional Objective Labs: Basic Metabolic Panel: Recent Labs  Lab 03/29/23 1528 03/31/23 1420 04/01/23 0816  NA 137 134* 134*  K 4.2 2.9* 4.0  CL 97* 94* 97*  CO2 24 29 29   GLUCOSE 107* 102* 105*  BUN 57* 12 37*  CREATININE 10.88* 2.13* 8.09*  CALCIUM 9.0 8.0* 8.7*  PHOS 2.6 <1.0* 2.8   Liver Function Tests: Recent Labs  Lab 03/29/23 1528 03/31/23 1420 04/01/23 0816  ALBUMIN 2.1* 2.4* 2.1*   CBC: Recent Labs  Lab 03/29/23 1529 03/31/23 1420  WBC 4.5 3.6*  HGB 8.6* 9.6*  HCT 30.2* 32.8*  MCV 97.1 98.2  PLT 152 155   Medications:   (feeding supplement) PROSource Plus  30 mL Oral BID BM   aspirin EC  81 mg Oral Daily   atorvastatin  80 mg Oral Daily   darbepoetin (ARANESP) injection - DIALYSIS  150 mcg Subcutaneous Q Sun-1800   diphenhydrAMINE  25 mg Intravenous Once   divalproex  125 mg Oral Q8H   feeding supplement  237 mL Oral BID WC   Gerhardt's butt cream  1 Application Topical QID   lidocaine  1 patch Transdermal Q2200   lidocaine  1 patch Transdermal Q24H   magnesium oxide  400 mg Oral Daily   mometasone-formoterol  2 puff Inhalation BID   pantoprazole  40  mg Oral BID   traZODone  50 mg Oral QHS   ziprasidone  20 mg Oral Daily   ziprasidone  20 mg Oral Daily    Dialysis Orders: Previously on [facility] thru [facility]  - changed to HD - will need outpatient HD unit established prior to d/c. 4hr, 3K, EDW ~96, L AVF   Assessment/Plan: 1. Debility - in CIR 2. Encephalopathy/agitation: Waxing/waning agitation, dementia sundowning. Ammonia fine and brain MRI with no acute findings. Disruptive during HD on 9/12, did better on 9/14 until last 5 minutes. 3. ESRD: Transitioned from PD to HD recently on TTS schedule. Using old AVF which is still functional, s/p f'gram 02/28/23. Next HD 9/17 4. Hypotension/volume: BP higher, mido 5mg  TID stopped 9/13 - follow. 5. Anemia: Hgb 9.6 - improving on Aranesp weekly. Last Tsat 38% with ferritin 2175.  No iron.  6. Metabolic bone disease: CorrCa ok, Phos low end on repeat - holding binders (renvela), no VDRA. 7.  Nutrition:  Alb very low, continue supps.  Renal diet w/fluid restrictions.  8.  Hx NSVT: On Coreg 10.  Recent PD related peritonitis and related fungemia/ enterococcus bacteremia - underwent PD cath removal, TDC  placement and HD cath placement and line holiday. Completed IV unasyn, IV micafungin and cipro on 8/15. TDC not replaced cause he had functional AVF.   11. Dispo: Waxing/waning behavioral issues. Will need to be able to calmly sit through HD for a full outpatient treatment without trying to stand for his own safety. Will need sitter as outpatient - apparently his wife is willing.  Ozzie Hoyle, PA-C 04/01/2023, 10:04 AM  [employer]

## 2023-04-02 ENCOUNTER — Encounter (HOSPITAL_COMMUNITY): Payer: Medicare Other

## 2023-04-02 DIAGNOSIS — D631 Anemia in chronic kidney disease: Secondary | ICD-10-CM | POA: Diagnosis not present

## 2023-04-02 DIAGNOSIS — I120 Hypertensive chronic kidney disease with stage 5 chronic kidney disease or end stage renal disease: Secondary | ICD-10-CM | POA: Diagnosis not present

## 2023-04-02 DIAGNOSIS — R4182 Altered mental status, unspecified: Secondary | ICD-10-CM | POA: Diagnosis not present

## 2023-04-02 DIAGNOSIS — R5381 Other malaise: Secondary | ICD-10-CM | POA: Diagnosis not present

## 2023-04-02 DIAGNOSIS — N250 Renal osteodystrophy: Secondary | ICD-10-CM | POA: Diagnosis not present

## 2023-04-02 DIAGNOSIS — Z992 Dependence on renal dialysis: Secondary | ICD-10-CM | POA: Diagnosis not present

## 2023-04-02 DIAGNOSIS — N186 End stage renal disease: Secondary | ICD-10-CM | POA: Diagnosis not present

## 2023-04-02 MED ORDER — CHLORHEXIDINE GLUCONATE CLOTH 2 % EX PADS
6.0000 | MEDICATED_PAD | Freq: Every day | CUTANEOUS | Status: DC
  Administered 2023-04-04 – 2023-04-06 (×3): 6 via TOPICAL

## 2023-04-02 NOTE — Progress Notes (Signed)
[facility] Progress Note   Subjective:   Seen in room - Up in wheelchair with safety belt. Says he's cold. Confused about dates.   Objective Vitals:   04/01/23 2116 04/02/23 0459 04/02/23 0643 04/02/23 0737  BP: 136/86  (!) 155/89   Pulse: 98  (!) 48   Resp: 19  18   Temp: 98.2 F (36.8 C)  98.3 F (36.8 C)   TempSrc:      SpO2: 95%  91% 92%  Weight:  97.9 kg    Height:       Physical Exam General: Sitting up in wheelchair,nad  Heart: RRR; no murmur Lungs: Occ rhonchi Abdomen: soft Extremities: trace BLE edema Dialysis Access: LUE AVG + bruit  Additional Objective Labs: Basic Metabolic Panel: Recent Labs  Lab 03/29/23 1528 03/31/23 1420 04/01/23 0816  NA 137 134* 134*  K 4.2 2.9* 4.0  CL 97* 94* 97*  CO2 24 29 29   GLUCOSE 107* 102* 105*  BUN 57* 12 37*  CREATININE 10.88* 2.13* 8.09*  CALCIUM 9.0 8.0* 8.7*  PHOS 2.6 <1.0* 2.8   Liver Function Tests: Recent Labs  Lab 03/29/23 1528 03/31/23 1420 04/01/23 0816  ALBUMIN 2.1* 2.4* 2.1*   CBC: Recent Labs  Lab 03/29/23 1529 03/31/23 1420  WBC 4.5 3.6*  HGB 8.6* 9.6*  HCT 30.2* 32.8*  MCV 97.1 98.2  PLT 152 155   Medications:   (feeding supplement) PROSource Plus  30 mL Oral BID BM   aspirin EC  81 mg Oral Daily   atorvastatin  80 mg Oral Daily   darbepoetin (ARANESP) injection - DIALYSIS  150 mcg Subcutaneous Q Sun-1800   diphenhydrAMINE  25 mg Intravenous Once   divalproex  125 mg Oral Q8H   feeding supplement  237 mL Oral BID WC   Gerhardt's butt cream  1 Application Topical QID   lidocaine  1 patch Transdermal Q2200   lidocaine  1 patch Transdermal Q24H   magnesium oxide  400 mg Oral Daily   mometasone-formoterol  2 puff Inhalation BID   pantoprazole  40 mg Oral BID   traZODone  50 mg Oral QHS   ziprasidone  20 mg Oral Daily   ziprasidone  20 mg Oral Daily    Dialysis Orders: Previously on [facility]CPD thru [facility] - changed to HD - will need outpatient HD unit  established prior to d/c. 4hr, 3K, EDW ~96, L AVF   Assessment/Plan: 1. Debility - in CIR 2. Encephalopathy/agitation: Waxing/waning agitation, dementia sundowning. Ammonia fine and brain MRI with no acute findings. Disruptive during HD on 9/12, did better on 9/14 until last 5 minutes. 3. ESRD: Transitioned from PD to HD recently on TTS schedule. Using old AVF which is still functional, s/p f'gram 02/28/23. Next HD 9/17 4. Hypotension/volume: BP higher, mido 5mg  TID stopped 9/13 - follow. 5. Anemia: Hgb 9.6 - improving on Aranesp weekly. Last Tsat 38% with ferritin 2175.  No iron.  6. Metabolic bone disease: CorrCa ok, Phos low end on repeat - holding binders (renvela), no VDRA. 7.  Nutrition:  Alb very low, continue supps.  Renal diet w/fluid restrictions.  8.  Hx NSVT: On Coreg 10.  Recent PD related peritonitis and related fungemia/ enterococcus bacteremia - underwent PD cath removal, TDC placement and HD cath placement and line holiday. Completed IV unasyn, IV micafungin and cipro on 8/15. TDC not replaced cause he had functional AVF.   11. Dispo: Waxing/waning behavioral issues. Will need to be able  to calmly sit through HD for a full outpatient treatment without trying to stand for his own safety. Will need sitter as outpatient - apparently his wife is willing.  Tomasa Blase PA-C [facility] 04/02/2023,9:07 AM

## 2023-04-02 NOTE — Progress Notes (Signed)
   04/02/23 1319  What Happened  Was fall witnessed? No  Who witnessed fall? telesitter  Patients activity before fall to/from bed, chair, or stretcher  Point of contact hip/leg (right)  Was patient injured? No  Found by Staff-comment (responded to call from telesitter)  Provider Notification  Provider Name/Title Wendi Maya PA  Date Provider Notified 04/02/23  Time Provider Notified 1328  Method of Notification Call  Notification Reason Fall  Provider response No new orders  Date of Provider Response 04/02/23  Time of Provider Response 1330  Follow Up  Family notified Yes - comment  Time family notified 1333  Additional tests No  Simple treatment Other (comment) (none)  Progress note created (see row info) Yes  Adult Fall Risk Assessment  Risk Factor Category (scoring not indicated) Fall has occurred during this admission (document High fall risk)  Age 72  Fall History: Fall within 6 months prior to admission 5  Elimination; Bowel and/or Urine Incontinence 2  Elimination; Bowel and/or Urine Urgency/Frequency 0  Medications: includes PCA/Opiates, Anti-convulsants, Anti-hypertensives, Diuretics, Hypnotics, Laxatives, Sedatives, and Psychotropics 5  Patient Care Equipment 1  Mobility-Assistance 2  Mobility-Gait 2  Mobility-Sensory Deficit 2  Altered awareness of immediate physical environment 1  Impulsiveness 2  Lack of understanding of one's physical/cognitive limitations 4  Total Score 28  Patient Fall Risk Level High fall risk  Adult Fall Risk Interventions  Required Bundle Interventions *See Row Information* High fall risk - low, moderate, and high requirements implemented  Additional Interventions Camera surveillance (with patient/family notification & education);Use of appropriate toileting equipment (bedpan, BSC, etc.)  Fall intervention(s) refused/Patient educated regarding refusal Bed alarm;Nonskid socks;Open door if unsupervised;Yellow bracelet  Screening for  Fall Injury Risk (To be completed on HIGH fall risk patients) - Assessing Need for Floor Mats  Risk For Fall Injury- Criteria for Floor Mats TeleSitter camera in use  Will Implement Floor Mats Yes  Vitals  Temp 98 F (36.7 C)  BP (!) 147/87  MAP (mmHg) 105  BP Location Right Arm  BP Method Automatic  Patient Position (if appropriate) Sitting  Pulse Rate 61  Pulse Rate Source Monitor  Resp 14  Oxygen Therapy  SpO2 95 %  O2 Device Room Air  Pain Assessment  Pain Scale 0-10  Pain Score 0  Neurological  Neuro (WDL) X  Level of Consciousness Alert  Orientation Level Oriented to person  Cognition Poor attention/concentration;Poor judgement;Poor safety awareness;Impulsive  Speech Slurred/Dysarthria  RUE Motor Response Purposeful movement  RUE Motor Strength 5  LUE Motor Response Purposeful movement  LUE Motor Strength 5  RLE Motor Response Purposeful movement  RLE Motor Strength 5  LLE Motor Response Purposeful movement  LLE Motor Strength 4  Neuro Symptoms Agitation  Neuro symptoms relieved by Anti-anxiety medication;Rest  Neuro Additional Assessments Tremors  Musculoskeletal  Musculoskeletal (WDL) X  Assistive Device None  Generalized Weakness Yes  Weight Bearing Restrictions No  Integumentary  Integumentary (WDL) WDL  Skin Color Appropriate for ethnicity  Skin Condition Dry  Skin Integrity Intact  Skin Turgor Non-tenting  Pain Assessment  Result of Injury No

## 2023-04-02 NOTE — Progress Notes (Signed)
Physical Therapy Session Note  Patient Details  Name: [name] [name] MRN: [id number] Date of Birth: August 19, 1950  Today's Date: 04/02/2023 PT Individual Time: 4098-1191 PT Individual Time Calculation (min): 42 min   Short Term Goals: Week 3:  PT Short Term Goal 1 (Week 3): STG = LTG due to ELOS  Skilled Therapeutic Interventions/Progress Updates:    Pt presents in room in [facility], asleep, awakens easily but falls asleep throughout session. Pt reporting SOB, SpO2 inconsistent as well as HR inconsistent. HR taken manually WNL. Pt provided with supplemental O2 throughout session due to reports of SOB. Session focused on therapeutic activities to facilitate participation with therapies as well as gait training. Pt does impulsively attempt to stand throughout session requiring close supervision and redirection when pt not asleep.  Pt transported to day room dependently, provided with 1L supplemental O2 via [place]. Pt very lethargic, vitals monitored SpO2 >94% with supplemental O2 and therapist attempts to increase arousal by verbal cues with pt able to answer questions initially then falling back to sleep. Pt agreeable to gait training. Pt ambulates 2x50' with R HHA min assist and 2nd person WC follow due to pt increased lethargy.  Pt transported back to room dependently, pt requesting to return to bed due to increased fatigue. Pt completes stand pivot transfer with CGA, sit to supine with supervision and verbal cues. Pt remains with all needs within reach, call light in place, posey belt alarm donned and activated, bed alarm donned, and telesitter activated at end of session.  Therapy Documentation Precautions:  Precautions Precautions: Fall Restrictions Weight Bearing Restrictions: No   Therapy/Group: Individual Therapy  Edwin Cap PT, DPT 04/02/2023, 3:59 PM

## 2023-04-02 NOTE — Progress Notes (Signed)
Physical Therapy Session Note  Patient Details  Name: [name] [name] MRN: [id number] Date of Birth: [birth year]  Today's Date: 04/02/2023 PT Individual Time: 1120-1145 PT Individual Time Calculation (min): 25 min   Short Term Goals: Week 3:  PT Short Term Goal 1 (Week 3): STG = LTG due to ELOS  Skilled Therapeutic Interventions/Progress Updates:     Pt received side lying in bed with wife present, giving pt a manicure. Pt agrees to therapy with gentle encouragement from PT and wife, and does not complain of pain. Pt performs sit to stand with minA and cues for anterior weight shifting and sequencing. Pt ambulates x100' with Rt HHA, minA overall, with cues at trunk for upright posture, as well as consistent cueing to lengthen strides to promote improved balance, as pt tends to take short, quick strides, contributing to anterior bias with ambulation. Seated rest break. Pt stands and ambulates additional x100' with minA progressing to modA at midoint due to anterior trunk lean and bias, requiring support at trunk and shoulders to prevent anterior LOB. Pt responds well to cues to "take bigger steps", but tends to revert to short steps with high step cadence. Pt ambulates 100' back to room with same assistance and cues. Left supine in bed with alarm intact.   Therapy Documentation Precautions:  Precautions Precautions: Fall Restrictions Weight Bearing Restrictions: No    Therapy/Group: Individual Therapy  Beau Fanny, PT, DPT 04/02/2023, 4:24 PM

## 2023-04-02 NOTE — Progress Notes (Signed)
PROGRESS NOTE   Subjective/Complaints: No acute events overnight noted.  His wife feels like he is sedated at times during the day.  Continues to have intermittent agitation per nursing.   ROS: Limited due to cognitive/behavioral   Objective:   No results found. Recent Labs    03/31/23 1420  WBC 3.6*  HGB 9.6*  HCT 32.8*  PLT 155     Recent Labs    03/31/23 1420 04/01/23 0816  NA 134* 134*  K 2.9* 4.0  CL 94* 97*  CO2 29 29  GLUCOSE 102* 105*  BUN 12 37*  CREATININE 2.13* 8.09*  CALCIUM 8.0* 8.7*      Intake/Output Summary (Last 24 hours) at 04/02/2023 1229 Last data filed at 04/01/2023 1854 Gross per 24 hour  Intake 420 ml  Output --  Net 420 ml         Physical Exam: Vital Signs Blood pressure (!) 155/89, pulse (!) 48, temperature 98.3 F (36.8 C), resp. rate 18, height 5\' 4"  (1.626 m), weight 97.9 kg, SpO2 92%.  Gen: no distress, normal appearing HEENT: oral mucosa pink and moist, NCAT Cardio: Reg rate Chest: CTAB, normal effort, normal rate of breathing Abd: soft, non-distended, positive bowel sounds Ext: no edema Psych: pleasant, normal affect Skin: intact  Ext: no clubbing, cyanosis, or edema Psych: pleasant but confused  Neurologic Exam:   Awake but drowsy appearing follows simple commands, Poor awareness and insight.  Appears calm at the moment Oriented to self, hospital + Memory deficit - ongoing + Poor insight + Mild L ptosis   Sensory exam: Intact to touch in all 4 extremities Motor exam: moves all 4 extremities without limitiations Coordination: Slight bilateral upper extremity tremor, intention, mild.    Musculoskeletal: No joint swelling noted No abnormal tone      Assessment/Plan: 1. Functional deficits which require 15 hr per week of interdisciplinary therapy in a comprehensive inpatient rehab setting. Physiatrist is providing close team supervision and 24 hour  management of active medical problems listed below. Physiatrist and rehab team continue to assess barriers to discharge/monitor patient progress toward functional and medical goals  Care Tool:  Bathing    Body parts bathed by patient: Right arm, Left arm, Chest, Abdomen, Front perineal area, Right upper leg, Buttocks, Left upper leg, Right lower leg, Left lower leg, Face         Bathing assist Assist Level: Moderate Assistance - Patient 50 - 74%     Upper Body Dressing/Undressing Upper body dressing   What is the patient wearing?: Pull over shirt    Upper body assist Assist Level: Minimal Assistance - Patient > 75%    Lower Body Dressing/Undressing      Lower body dressing      What is the patient wearing?: Pants     Lower body assist Assist for lower body dressing: Moderate Assistance - Patient 50 - 74%     Toileting Toileting    Toileting assist Assist for toileting: Moderate Assistance - Patient 50 - 74%     Transfers Chair/bed transfer  Transfers assist     Chair/bed transfer assist level: Minimal Assistance - Patient > 75%  Locomotion Ambulation   Ambulation assist      Assist level: Minimal Assistance - Patient > 75% Assistive device: No Device Max distance: 175'   Walk 10 feet activity   Assist     Assist level: Contact Guard/Touching assist Assistive device: No Device   Walk 50 feet activity   Assist Walk 50 feet with 2 turns activity did not occur: Safety/medical concerns  Assist level: Minimal Assistance - Patient > 75% Assistive device: No Device    Walk 150 feet activity   Assist Walk 150 feet activity did not occur: Safety/medical concerns  Assist level: Minimal Assistance - Patient > 75% Assistive device: No Device    Walk 10 feet on uneven surface  activity   Assist Walk 10 feet on uneven surfaces activity did not occur: Safety/medical concerns         Wheelchair     Assist Is the patient using a  wheelchair?: Yes Type of Wheelchair: Manual    Wheelchair assist level: Dependent - Patient 0%      Wheelchair 50 feet with 2 turns activity    Assist        Assist Level: Dependent - Patient 0%   Wheelchair 150 feet activity     Assist      Assist Level: Dependent - Patient 0%   Blood pressure (!) 155/89, pulse (!) 48, temperature 98.3 F (36.8 C), resp. rate 18, height 5\' 4"  (1.626 m), weight 97.9 kg, SpO2 92%.  Medical Problem List and Plan: 1. Functional deficits secondary to metabolic encephalopathy due to sepsis secondary to catheter peritonitis and hypoxemia              -patient may not shower             -ELOS/Goals: 16-18 days, PT/OT Sup, SLP min A -tentative discharge date 9-9           -Continue CIR therapies including PT, OT, and SLP   -Will review records from [facility] -- has worsened since approximately 8/22, possible 8/19, consistent with records from nursing at [facility] with current behavior.    - continue enclosure bed for safety  -  therapy 15/7  -Looking into SNF discharge   2.  Antithrombotics: -DVT/anticoagulation:  Pharmaceutical: Heparin, 03/23/23 changed to Lovenox yesterday to help improved compliance             -antiplatelet therapy: aspirin and Plavix   3. Pain Management: Tylenol as needed             -Lidoderm patches to back  -Elavil 25 mg at bedtime for nerve pain and sleep 8/27  -8/29: Pain well controlled on Elavil; DC lyrica, PRN Tylenol/oxy 2.5-5 mg d/t CKD  8-30: Appears patient has been getting pain medications together overnight along with antipsychotics, will discuss with nursing  8-31: Patient having an difficulty endorsing needs and not utilizing PRNs, scheduled oxycodone 5 mg 3 times daily and has 2.5 mg prn 3 times daily available.   9-1: Not utilizing PRNs, minimal complaints of pain, will discontinue scheduled given lethargy today..  -muscle rub R shoulder  9/16 patient reports pain is under control overall   4.  Mood/Behavior/Sleep: LCSW to evaluate and provide emotional support             -continue Seroquel 50 mg TID             -clonazepam 0.5 mg TID prn             -  Geodon 10 mg IM q 12 hours prn             -antipsychotic agents: n/a  -8-27: Agitated by soft restraints in bed, transition to 4 side rails, Posey alarm, and TeleSitter for now.  Veil bed on standby if needed, however feel we should avoid this if possible due to likelihood of agitating patient further   - 8/28: Placed in veil bed; wrist restraints for dialysis. Scheduled Geodon 20 mg twice daily; discontinue Seroquel and clonazepam due to no perceivable benefit. Add as needed Zyprexa 5 mg twice daily p.o. or IM due to recent cardiac arrhythmias and concern for QTc prolongation. As needed ramelteon for sleep. Stop gabapentin due to possible side effect of agitation  - 8/28: No improvement except temporarily with Geodon; add depakote 125 mg q8h for ongoing aggitation, mood stabilization. Increase elavil to 50 mg at bedtime.  Workup ongoing.    - 8/29: EEG negative, labs normal. MRI pending. Sleeping well and calm since starting depakote and increase in elavil. Will monitor tonight and wean Geodon in AM if appropriate.   8-30: MRI showing chronic microvascular changes, otherwise normal.  Increasingly calm and appropriate during the day, with ongoing agitation at nighttime.  Will increase nighttime Geodon to 40 mg; will discuss sleep log with nursing, as it has not been performed.  8-31 no sleep overnight per sleep log, again getting agitated around 9 PM and 5 AM.  Not responsive to as needed Zyprexa, responsive to Ativan.  Attempted to wean off scheduled a.m. Geodon today, but patient became extremely agitated and unable to redirect, calm down with readministration of medication.  Resume Geodon 20 mg every morning/40 mg every afternoon, increased Elavil to 75 mg nightly, adding trazodone 50 mg nightly as needed.  P.o./IV Ativan 0.5 mg every 6 hours  for agitation.  9-1: slept well overnight with trazodone and increased Elavil, no PRNs used.  Unfortunately, oversedated today.  Discontinue scheduled pain medications as above, start wean of Elavil to 50 mg x2 days -> 25 mg x2 days -> 10 mg x2 days, off due to no apparent benefit and no complaints of pain.  Reduce Geodon from 20 mg every morning/40 mg every evening to 20 mg twice daily.  Continue trazodone as needed for sleep, as this seems to be the difference for a restful night yesterday.  If he remains overly sedated, would dose reduce and DC Depakote next.  Keep Ativan as needed d/t dialysis not having other options available.  9/2-3--non agitated but still sleepy   -dc'ed depakote as I doubt it's doing much at this dose anyway   -continue geodon at 20mg  bid   -scheduled trazodone at HS with back up dose if needed    -sleep was intermittent last night   -requires enclosure bed for safety  9/4 continue enclosure bed for now, consider trying DC of this device early next week  -9/5 appears to be doing better overall this morning, continues to have intermittent agitation at times.  Continue enclosure bed  -9/6 agitation appears to be improving, patient is skeptical about medications and has been declining some of these.  Will discontinue multivitamin, RisaQuad to try to decrease pill burden  -9/7 continues to have intermittent episodes of agitation however overall improving, appears to be taking most of his medications  9/9 agitation and mental status appears to be improving -discussing discontinuation of enclosure bed with team 9/10 Geodon dose increased to 40 mg at night, continue 20 mg in  the morning 9/11 No acute events noted, will check with team regarding DC enclosure bed, nursing reports he did ok last night but had dialysis 9/12 continues to have sundowning/waxing waning confusion and agitation, continue enclosure bed for now, will caution with Geodon will continue current dose but hold off  on any escalation as this can contribute to bradycardia 9/16 no longer using enclosure bed, continue Geodon 20 mg twice daily for now due to intermittent agitation 5. Neuropsych/cognition: This patient is not capable of making decisions on his own behalf   -recommend neuropsych consult   - 8/27: Ongoing aggitation; Consult hospitalist for further workup on potential contributors to encephalopathy. Pending UA, LA 2.1-> 1.4; otherwise negative so far -Delirium precautions, sleep log, Veiled bed for safety and to minimize restraints -Wife has expressed that she is not interested in palliative care at this time; hopefully with above measures patient can get dialysis in the next 48 hours to limit worsening uremic encephalopathy and medical decline    - 8/29: Aggitation much improved, able to get some dialysis last night and today. See above for medication changes. Calling wife tonight to discuss.   8-30: Still with moderate cognitive deficits, however appears once patient is less agitated he is better able to concentrate and has better orientation.  MRI negative for acute findings, monitor.  9/3 agitation better. More sedated than anything at this point. Confused as well.    -depakote stopped   -continue other meds as rx'ed  6. Skin/Wound Care: Routine skin care checks             -sacral skin off-loading/protection   7. Fluids/Electrolytes/Nutrition: Strict Is and Os and follow-up chemistries per nephrology             -renal diet/ 1200 cc FR  - Renvela 800 mg 3 times daily for hyperphosphatemia added per nephrology 8-27   8: Hypotension: monitor TID and prn             -continue midodrine as needed during dialysis  Normotensive, monitor  9-1: Consistently 90s over 60s, has been stable but given findings of microvascular changes on MRI, may be too low for patient to perfuse properly.  Will add midodrine 5 mg tid and monitor.  DC Coreg as heart rate has been low to normal; may resume at low-dose  once blood pressure improved.   9/2 continue with ccurrent regimen. Bp's hr remain low   -elavil wean should help  9/9-10 BP stable, continue midodrine and monitor  9/13 DC midodrine today as BP has been higher, can restart if needed  9/16 blood pressure has been stable, continue to hold midodrine     04/02/2023    6:43 AM 04/02/2023    4:59 AM 04/01/2023    9:16 PM  Vitals with BMI  Weight  215 lbs 13 oz   BMI  37.03   Systolic 155  136  Diastolic 89  86  Pulse 48  98      9: Hyperlipidemia: continue statin   10: CAD s/p CABG 2023: on DAPT and statin.  - troponin elevated but stable, monitor 8/27   11: DM-2: A1c = 6.3% (at home on Lantus 22 units at bedtime, Tradjenta 5 mg daily)        -monitor PO intake             -continue SSI (0-6 units) does not appear to be requiring coverage   - 9/6 DC SSI, change CBGs to daily  9/8 discontinue CBGs  9/16 glucose still controlled on BMP yesterday   No results for input(s): "GLUCAP" in the last 72 hours.    12: ESRD: HD on Tues, Thurs, Sat -nephrology made aware   - Unable to tolerate 8/27 d/t aggitation, stopped early 8/28;  - attempting again 8/29, stopped early again -Per nephrology, redirectable with gum -Have to switch restraints during dialysis sessions from veil bed to regular bed, then resume veil bed order once at rehab -9/9 tolerating HD better  9/13 agitated during dialysis yesterday, nephrology feels like he will need a sitter during HD, HD aborted early at 1.5 hours yesterday   13: COPD/OSA; chronic home 2L O2 (Symbicort, Ventolin, prednisone 40 mg daily at home)             -continue symbicort  -Added home oxygen back 8-27   14: GERD: continue Protonix   15: Non-sustained VT/pAF: per cardiology, Dr. Algie Coffer: -continue carvedilol 6.25 mg BID; consider increasing to 12.5 mg BID before starting low dose amiodarone  -consider Eliquis and discontinue asa and Plavix in one week if no active GI bleeding and stable  hemoglobin from 8/24 -hemoglobin has remained stable but very low around 7, no recurrent A-fib, monitor for now given agitation/fall 8-31 and consider initiating Eliquis when safe   -Continue mag-ox to avoid hypomagnesemia -8-27: Single instance of bradycardia this morning 40s, may have been missed read, monitor through today and do not further adjust beta-blocker medications  -9-3 hr controlled after holding coreg -9/4-5 HR stable, continue to monitor -9/9 consider Eliquis this week if agitation continues to improve -9/10 will hold off on Eliquis due to sundowning at night, will see how he does with increase Geodon -9/11 EKG for bradycardia episode today, 2nd degree A-V block mobitz type 1.  Continue to hold beta blocker,9/11 will restart eliquis at 2.5 mg BID dose. Will consult cardiology regarding need for ASA/plavix and coreg.  9/12 appreciate cardiology consult yesterday, DAPT transition to daily aspirin 81 only and Eliquis was discontinued, caution with Geodon as this can contribute to bradycardia 9/13 will decrease Geodon nighttime dose to 20 due to episodes of bradycardia  9/16 continue further decrease of Geodon if agitation continues to improve, will DC seroquel prn    04/02/2023    6:43 AM 04/02/2023    4:59 AM 04/01/2023    9:16 PM  Vitals with BMI  Weight  215 lbs 13 oz   BMI  37.03   Systolic 155  136  Diastolic 89  86  Pulse 48  98       16: HFrEF: Daily weights. Monitor for signs of overload  - 9/7 continue daily weights, fluid level management per hemodialysis team Weight reviewed and stable  Filed Weights   03/31/23 1820 04/01/23 0353 04/02/23 0459  Weight: 97.8 kg 98.1 kg 97.9 kg      17: Liver nodularity on CT scan: LFTs reported to be normal. Avoid hepatotoxic medications    -Abdominal ultrasound earlier this admission with minimal ascites; ammonia within normal limits  -8/31: Did discuss with nephrology whether p.o. lactulose would be beneficial despite  normal ammonia, after reviewing records unsure that this would be beneficial    18: Epistaxis - resolved             -previously improved with flonase and saline spray   19: Morbid Obesity. Dietary counseling Body mass index is 39.01 kg/m.  20. Bradycardic: continue to monitor HR TID  21. HTN: resolved after HD,  continue to monitor TID       LOS: 21 days A FACE TO FACE EVALUATION WAS PERFORMED  Fanny Dance 04/02/2023, 12:29 PM

## 2023-04-03 MED ORDER — LIDOCAINE-PRILOCAINE 2.5-2.5 % EX CREA: 1.0000 | TOPICAL_CREAM | CUTANEOUS | Status: DC | PRN

## 2023-04-03 MED ORDER — PENTAFLUOROPROP-TETRAFLUOROETH EX AERO: 1.0000 | INHALATION_SPRAY | CUTANEOUS | Status: DC | PRN

## 2023-04-03 MED ORDER — ANTICOAGULANT SODIUM CITRATE 4% (200MG/5ML) IV SOLN: 5.0000 mL | Status: DC | PRN

## 2023-04-03 MED ORDER — ALTEPLASE 2 MG IJ SOLR: 2.0000 mg | Freq: Once | INTRAMUSCULAR | Status: DC | PRN

## 2023-04-03 MED ORDER — ANTICOAGULANT SODIUM CITRATE 4% (200MG/5ML) IV SOLN
5.0000 mL | Status: DC | PRN
  Filled 2023-04-03: qty 5

## 2023-04-03 MED ORDER — ARIPIPRAZOLE 10 MG PO TABS
5.0000 mg | ORAL_TABLET | Freq: Every day | ORAL | Status: DC
  Administered 2023-04-03 – 2023-04-12 (×10): 5 mg via ORAL
  Filled 2023-04-03 (×10): qty 1

## 2023-04-03 MED ORDER — HEPARIN SODIUM (PORCINE) 1000 UNIT/ML DIALYSIS
20.0000 [IU]/kg | INTRAMUSCULAR | Status: DC | PRN
  Administered 2023-04-03: 2000 [IU] via INTRAVENOUS_CENTRAL
  Filled 2023-04-03: qty 2

## 2023-04-03 MED ORDER — LIDOCAINE HCL (PF) 1 % IJ SOLN: 5.0000 mL | INTRAMUSCULAR | Status: DC | PRN

## 2023-04-03 MED ORDER — HEPARIN SODIUM (PORCINE) 1000 UNIT/ML DIALYSIS: 1000.0000 [IU] | INTRAMUSCULAR | Status: DC | PRN

## 2023-04-03 NOTE — Progress Notes (Signed)
Physical Therapy Session Note  Patient Details  Name: [name] [name] MRN: [id number] Date of Birth: 07/28/1950  Today's Date: 04/03/2023 PT Individual Time: 0801-0851 PT Individual Time Calculation (min): 50 min   Short Term Goals: Week 3:  PT Short Term Goal 1 (Week 3): STG = LTG due to ELOS  Skilled Therapeutic Interventions/Progress Updates:    Pt presents behind nurse's station in [facility], asleep. Per communication with RN pt had increased agitation and hallucinations all night with little to no sleep. Pt does awaken with increased time, verbal and tactile stimulation. Once awake pt motivate to participate with therapy session however pt falling asleep frequently throughout session. Session focused on gait training, therapeutic exercise, and therapeutic activities to elevate arousal and regulate days vs. Nights.   Pt transported back to room to don shirt as pt presents without one. Pt dons with mod assist and verbal cues due to lethargy. Pt transported to day room dependently for time management.  Pt ambulates with R HHA min assist 2x30', demonstrate significantly shortened step length, decreased gait speed, increased postural sway, and sitting prematurely once close to sitting surface requiring max verbal cues and mod assist to transition to sitting.  After extended seated rest break pt completes ambulatory transfer to nustep to complete interval training BUE/BLE 1 min work/1 min rest for total 6 minutes, workload 4, 60-80 SPM. Completed to promote BUE/BLE strengthening and cardiopulmonary endurance. Pt requires max verbal cues for rest intervals even with visual cues from timer, with pt impulsively terminating activity and attempting to stand from nustep.  Pt completes ambulatory transfer back to WC ~10'. Pt requests to use restroom. Pt returned to room via WC dependently for time management. Pt completes ambulatory transfer to toilet with CGA, pt attempting grab hold of objects as he ambulates  for stability. Pt completes transfer with min assist to commode, verbal cues for positioning. Pt able to complete 3/3 toileting tasks with CGA, continent of bowel, charted. Pt ambulates to sink with CGA/min assist ~15'. Pt remains standing for hand hygiene, verbal cues for sequencing hygiene (soap, rinse, paper towels) and pt sits posteriorly to WC positioned in front of sink. Pt remains standing for 2.5 minutes.  Following toileting pt refusing further participation secondary to fatigue. Pt returned to nurses station seated in [facility] with chair alarm donned and activated at end of session. Pt missing 25 out of 75 minutes.  Therapy Documentation Precautions:  Precautions Precautions: Fall Restrictions Weight Bearing Restrictions: No General: PT Amount of Missed Time (min): 25 Minutes PT Missed Treatment Reason: Patient unwilling to participate;Patient fatigue (pt refusing continued participation secondary to fatigue)   Therapy/Group: Individual Therapy  Edwin Cap PT, DPT 04/03/2023, 8:54 AM

## 2023-04-03 NOTE — Progress Notes (Signed)
Pt laying in bed, head start belt activated with nurse call light attached, bed alarm on, tele-sitter orders active.

## 2023-04-03 NOTE — Progress Notes (Signed)
   04/03/23 2018  Vitals  BP (!) 148/78  MAP (mmHg) 96  BP Location Right Arm  BP Method Automatic  Patient Position (if appropriate) Lying  Pulse Rate 68  Pulse Rate Source Monitor  ECG Heart Rate 72  Resp (!) 22  Post Treatment  Dialyzer Clearance Lightly streaked  Hemodialysis Intake (mL) 0 mL  Liters Processed 91.9  Fluid Removed (mL) 2000 mL  Tolerated HD Treatment Yes  Post-Hemodialysis Comments pt combative  AVG/AVF Arterial Site Held (minutes) 10 minutes  AVG/AVF Venous Site Held (minutes) 10 minutes   Pt treatment 4 hour , UF ,hand off to [facility]

## 2023-04-03 NOTE — Progress Notes (Signed)
Patient ID: [name] [name], male   DOB: [birth year], 72 y.o.   MRN: [id number]  SW spoke with pt wife to provide updates from team conference, and continuing to challenge with behaviors. She is amenable to sitting with pt at dialysis clinic if this is what is needed when able to get into a SNF. She states if unable to get into SNF, she will take him home. She reports concerns about medications, and has already shared with attending and PA. SW informed will follow-up as updates are available.   Cecile Sheerer, MSW, LCSWA Office: [phone] Cell: [phone] Fax: [phone]

## 2023-04-03 NOTE — Patient Care Conference (Signed)
Inpatient RehabilitationTeam Conference and Plan of Care Update Date: 04/03/2023   Time: 10:45 AM    Patient Name: [name] [name]      Medical Record Number: [id number]  Date of Birth: Oct 15, 1950 Sex: Male         Room/Bed: 4W13C/4W13C-01 Payor Info: Payor: MEDICARE / Plan: MEDICARE PART A AND B / Product Type: *No Product type* /    Admit Date/Time:  03/12/2023  4:25 PM  Primary Diagnosis:  Acute metabolic encephalopathy  Hospital Problems: Principal Problem:   Acute metabolic encephalopathy Active Problems:   Type 2 diabetes mellitus with chronic kidney disease on chronic dialysis, with long-term current use of insulin (HCC)   HFrEF (heart failure with reduced ejection fraction) (HCC)   Morbid obesity (HCC)   Encephalopathy   Epistaxis   PAF (paroxysmal atrial fibrillation) (HCC)   Long term (current) use of insulin (HCC)   ESRD on dialysis [facility])   Debility    Expected Discharge Date: Expected Discharge Date:  (SNF)  Team Members Present: Physician leading conference: Dr. Faith Rogue Social Worker Present: Cecile Sheerer, LCSWA Nurse Present: Vedia Pereyra, RN PT Present: Darrold Span, PT OT Present: [name], OT SLP Present: Feliberto Gottron, SLP PPS Coordinator present : Fae Pippin, SLP     Current Status/Progress Goal Weekly Team Focus  Bowel/Bladder   cont B&B LBM 04/03/23   remain cont   assist with toileting PRN    Swallow/Nutrition/ Hydration   regular/thin liquids, intermittent supervision needed for slow rate   SupervisionSupervision  continued diet tolerance and use of strategies    ADL's   min to mod A now - more sleeply and requiring more A to be through and doesnt always participate in active bathing and dressing   Min A/supservision   therapy q.d still a fall risk and difficulty with safety awareness, cognitive retraining    Mobility   CGA/minA overall   minA ambulatory household distances, WC for community  barriers:  agitation and lethargy; continue focusing on gait training, global strengthening and endurance    Communication                Safety/Cognition/ Behavioral Observations  modA overall for cognition depending on alertness, fatigue, and willingness to participate   minA   continued participation, use of memory aids, use of external orienation aids, basic functional problem solving    Pain   no pain   remain pain free   assess pain qshift and PRN    Skin   skin intact   remain intact  assess skin qshift and PRN      Discharge Planning:  D/c plan remains SNF placement as wife is unable to provide 24/7 care due to working twice a week, and other responsibilities. Pt is not ready to explore SNF options due to behaviors. When appropriate, referral will eb sent. SW will continue to confirm there are no barriers to discharge.   Team Discussion: Acute metabolic encephalopathy. Behavior Plan.  Tele-sitter. Remains restless and continues with hallucinations at night. HD T/Th/Sat. Has had several "falls" without injury. Behaviors are redirectable during the day hours. Having behaviors in dialysis. Behavior plan reviewed.  Geodon HS dose increased last week and has been back down.  Will not endorse enclosure bed and will continue plan of care using alternative safety measures. Sleep chart. Daily weight. Tolerating renal carb-mod diet with 1200 fluid restriction. Continuing to work on Sport and exercise psychologist. Increase in impulsiveness. Was independent with dressing and now  is not. Therapies now every day VS 15/7. Question goals of care.  Patient on target to meet rehab goals: SNF placement  *See Care Plan and progress notes for long and short-term goals.   Revisions to Treatment Plan:  Medication adjustments. Review behavior plan. Monitor labs/VS  Teaching Needs: Medications, safety, self care, gait/transfer training, behavior/mood modifications, etc.   Current Barriers to  Discharge: Decreased caregiver support, Insurance for SNF coverage, Hemodialysis, Medication compliance, and Behavior  Possible Resolutions to Barriers: Alternative safety measures used to maintain safe environment SNF placement Compliant with medications Able to self monitor behaviors/mood independently Order recommended DME if needed.      Medical Summary Current Status: metabolic encephalopathy, ESRD, agitation, Bradycardia  Barriers to Discharge: Cardiac Complications;Medical stability;Renal Insufficiency/Failure;Behavior/Mood  Barriers to Discharge Comments: metabolic encephalopathy, ESRD, agitation, Bradycardia Possible Resolutions to [employer] Focus: SNF placement options, continue geodon and ativan PRN   Continued Need for Acute Rehabilitation Level of Care: The patient requires daily medical management by a physician with specialized training in physical medicine and rehabilitation for the following reasons: Direction of a multidisciplinary physical rehabilitation program to maximize functional independence : Yes Medical management of patient stability for increased activity during participation in an intensive rehabilitation regime.: Yes Analysis of laboratory values and/or radiology reports with any subsequent need for medication adjustment and/or medical intervention. : Yes   I attest that I was present, lead the team conference, and concur with the assessment and plan of the team.   [name] 04/04/2023, 8:39 AM

## 2023-04-03 NOTE — Progress Notes (Signed)
Pt has been agitated all night and will not stay in the bed. A "fall" occurred witnessed by telesitter. Pt slid between the rails and onto the mat to rest on his knees (in a praying position). Pt was not injured. Pt has been verbally aggressive with staff and refusing vital signs all night and morning. Pt is hallucinating saying that dogs are in the bed with him and that he doesn't sleep with dogs. He is also stating other people are in the room with him while staff is present. He is displaying both auditory and visual hallucinations, getting frustrated with staff for not being able to see the things that he is seeing. Pt has been back and forth all night between sitting at the nurses station and being in his room. Pt needs a physical sitter, he is not redirectable with voice commands by a telesitter. Pt is currently at the nurses station in his wheel chair with the chair alarm in place sitting beside this nurse. No other concerns at this time.

## 2023-04-03 NOTE — Progress Notes (Signed)
[facility] Progress Note   Subjective:   Seen in room - Up in wheelchair with safety belt. Knows he's going to dialysis today.   Objective Vitals:   04/02/23 0737 04/02/23 1319 04/02/23 1435 04/02/23 2200  BP:  (!) 147/87 (!) 140/59 (!) 171/73  Pulse:  61 (!) 53 64  Resp:  14 17 18   Temp:  98 F (36.7 C) 98.1 F (36.7 C)   TempSrc:      SpO2: 92% 95% 91% 99%  Weight:      Height:       Physical Exam General: Sitting up in wheelchair,nad  Heart: RRR; no murmur Lungs: Occ rhonchi Abdomen: soft Extremities: trace BLE edema Dialysis Access: LUE AVG + bruit  Additional Objective Labs: Basic Metabolic Panel: Recent Labs  Lab 03/29/23 1528 03/31/23 1420 04/01/23 0816  NA 137 134* 134*  K 4.2 2.9* 4.0  CL 97* 94* 97*  CO2 24 29 29   GLUCOSE 107* 102* 105*  BUN 57* 12 37*  CREATININE 10.88* 2.13* 8.09*  CALCIUM 9.0 8.0* 8.7*  PHOS 2.6 <1.0* 2.8   Liver Function Tests: Recent Labs  Lab 03/29/23 1528 03/31/23 1420 04/01/23 0816  ALBUMIN 2.1* 2.4* 2.1*   CBC: Recent Labs  Lab 03/29/23 1529 03/31/23 1420  WBC 4.5 3.6*  HGB 8.6* 9.6*  HCT 30.2* 32.8*  MCV 97.1 98.2  PLT 152 155   Medications:   (feeding supplement) PROSource Plus  30 mL Oral BID BM   aspirin EC  81 mg Oral Daily   atorvastatin  80 mg Oral Daily   Chlorhexidine Gluconate Cloth  6 each Topical Q0600   darbepoetin (ARANESP) injection - DIALYSIS  150 mcg Subcutaneous Q Sun-1800   divalproex  125 mg Oral Q8H   feeding supplement  237 mL Oral BID WC   Gerhardt's butt cream  1 Application Topical QID   lidocaine  1 patch Transdermal Q2200   lidocaine  1 patch Transdermal Q24H   magnesium oxide  400 mg Oral Daily   mometasone-formoterol  2 puff Inhalation BID   pantoprazole  40 mg Oral BID   traZODone  50 mg Oral QHS   ziprasidone  20 mg Oral Daily   ziprasidone  20 mg Oral Daily    Dialysis Orders: Previously on [facility] thru [facility] - changed to HD - will need  outpatient HD unit established prior to d/c. 4hr, 3K, EDW ~96, L AVF   Assessment/Plan: 1. Debility - in CIR 2. Encephalopathy/agitation: Waxing/waning agitation, dementia sundowning. Ammonia fine and brain MRI with no acute findings. Disruptive during HD on 9/12, did better on 9/14 until last 5 minutes. 3. ESRD: Transitioned from PD to HD recently on TTS schedule. Using old AVF which is still functional, s/p f'gram 02/28/23. Next HD 9/17 4. Hypotension/volume: BP higher, mido 5mg  TID stopped 9/13 - follow. 5. Anemia: Hgb 9.6 - improving on Aranesp weekly. Last Tsat 38% with ferritin 2175.  No iron.  6. Metabolic bone disease: CorrCa ok, Phos low end on repeat - holding binders (renvela), no VDRA. 7.  Nutrition:  Alb very low, continue supps.  Renal diet w/fluid restrictions.  8.  Hx NSVT: On Coreg 10.  Recent PD related peritonitis and related fungemia/ enterococcus bacteremia - underwent PD cath removal, TDC placement and HD cath placement and line holiday. Completed IV unasyn, IV micafungin and cipro on 8/15. TDC not replaced cause he had functional AVF.   11. Dispo: Will need to be able  to calmly sit through HD for a full outpatient treatment without trying to stand for his own safety. Will need sitter as outpatient - apparently his wife is willing. Needs placement to outpatient dialysis center. Renal navigator aware.   Tomasa Blase PA-C [facility] 04/03/2023,12:47 PM

## 2023-04-03 NOTE — Plan of Care (Signed)
Updated Plan of Care  Goals downgraded due to participation, progress made thus far, and severity of deficits:  Problem: RH Swallowing Goal: LTG Patient will consume least restrictive diet using compensatory strategies with assistance (SLP) Description: LTG:  Patient will consume least restrictive diet using compensatory strategies with assistance (SLP) Flowsheets (Taken 04/03/2023 1237) LTG: Pt Patient will consume least restrictive diet using compensatory strategies with assistance of (SLP): Supervision   Problem: RH Cognition - SLP Goal: RH LTG Patient will demonstrate orientation with cues Description:  LTG:  Patient will demonstrate orientation to person/place/time/situation with cues (SLP)   Flowsheets Taken 04/03/2023 1237 LTG: Patient will demonstrate orientation using cueing (SLP): Moderate Assistance - Patient 50 - 74% Taken 03/13/2023 1255 LTG Patient will demonstrate orientation to:  Person  Place  Time  Situation   Problem: RH Memory Goal: LTG Patient will demonstrate ability for day to day (SLP) Description: LTG:   Patient will demonstrate ability for day to day recall/carryover during cognitive/linguistic activities with assist  (SLP) Flowsheets Taken 04/03/2023 1237 LTG: Patient will demonstrate ability for day to day recall/carryover during cognitive/linguistic activities with assist (SLP): Moderate Assistance - Patient 50 - 74% Taken 03/13/2023 1255 LTG: Patient will demonstrate ability for day to day recall: New information   Problem: RH Awareness Goal: LTG: Patient will demonstrate awareness during functional activites type of (SLP) Description: LTG: Patient will demonstrate awareness during functional activites type of (SLP) Flowsheets Taken 04/03/2023 1237 LTG: Patient will demonstrate awareness during cognitive/linguistic activities with assistance of (SLP): Moderate Assistance - Patient 50 - 74% Taken 03/13/2023  1255 Patient will demonstrate during cognitive/linguistic activities awareness type of: Intellectual

## 2023-04-03 NOTE — Progress Notes (Signed)
Speech Language Pathology Weekly Progress and Session Note  Patient Details  Name: [name] [name] MRN: [id number] Date of Birth: 07-02-1951  Beginning of progress report period: March 27, 2023 End of progress report period: April 03, 2023  Today's Date: 04/03/2023 SLP Individual Time: 4098-1191 SLP Individual Time Calculation (min): 32 min  Short Term Goals: Week 3: SLP Short Term Goal 1 (Week 3): Patient will demonstrate intellectual awareness of deficits via verbally stating 2 cognitive and 2 physical impairments given modA SLP Short Term Goal 1 - Progress (Week 3): Not met SLP Short Term Goal 2 (Week 3): Patient will demonstrate use of recommended safe swallow strategies during mealtimes with modA. SLP Short Term Goal 2 - Progress (Week 3): Not met (not observed this reporting period d/t reduced patient participation) SLP Short Term Goal 3 (Week 3): Patient will utilize external aids to orient to time and situation given minA SLP Short Term Goal 3 - Progress (Week 3): Not met SLP Short Term Goal 4 (Week 3): Patient will recall basic daily information and events in 80% of opportunities events given modA SLP Short Term Goal 4 - Progress (Week 3): Met   New Short Term Goals: Week 4: SLP Short Term Goal 1 (Week 4): Patient will demonstrate intellectual awareness of deficits via verbally stating 2 cognitive and 2 physical impairments given modA SLP Short Term Goal 2 (Week 4): Patient will demonstrate use of recommended safe swallow strategies during mealtimes with modA. SLP Short Term Goal 3 (Week 4): Patient will utilize external aids to orient to time and situation given modA SLP Short Term Goal 4 (Week 4): Patient will participate in therapy sessions in 80% of opportunities given modA multimodal cues.  Weekly Progress Updates: Patient is making significantly slow progress towards indicated therapy goals, meeting 1/4 short term goals set this reporting period. Patient currently  exhibits reduced participation in speech therapy services, either too somnolent or too agitated to engage depending on the day. Patient's abilities currently wax and wane with alertness, however overall patient is max-totalA for nearly all basic cognitive functions including orientation using room aids, awareness of deficits, and basic problem solving skills. At most recent session, patient required modA to recall recent therapy events from earlier in the day. Patient's swallowing function is unknown as participation has limited therapists time to target this goal. SLP downgraded patient's goals to reflect progress made and severity of deficits. Patient and family education ongoing. Patient will continue to benefit from skilled therapy services during remainder of CIR stay.    Intensity: Minumum of 1-2 x/day, 30 to 90 minutes Frequency: 3 to 5 out of 7 days Duration/Length of Stay: TBD d/t possible SNF placement Treatment/Interventions: Cognitive remediation/compensation;Environmental controls;Cueing hierarchy;Functional tasks;Therapeutic Activities;Internal/external aids;Patient/family education  Daily Session  Skilled Therapeutic Interventions: SLP conducted skilled therapy session targeting cognitive retraining goals. Patient greeted upright in wheelchair at nursing station, transferred to speech therapy office to limit distractions. During session, patient demonstrated significantly reduced safety, taking belt alarm off and attempting to stand several times throughout session, once without wheelchair brakes locked despite max verbal cues from SLP. SLP locked wheelchair brakes to promote safety. Patient doffed grip socks during session despite SLP attempted reasoning for sock wearing. Patient highly somnolent during today's session, requiring max auditory cues to remain awake and totalA to sustain attention to task for 5 minutes. Patient disoriented to place, time, and situation, requiring max-totalA to  utilize provided aid indicating date. Patient recalled therapy sessions from earlier in the morning  given modA. Towards end of session, patient too somnolent to continue functional therapy, thus was returned to the nurses station with belt alarm refastened.      Pain Pain Assessment Pain Scale: 0-10 Pain Score: 0-No pain  Therapy/Group: Individual Therapy  [name], M.A., CCC-SLP  [name] 04/03/2023, 12:46 PM

## 2023-04-03 NOTE — Progress Notes (Signed)
PROGRESS NOTE   Subjective/Complaints: Patient was agitated last night.  He had a fall without any injuries at night.  He was noted to have auditory and visual hallucinations per nursing.  Patient noted to be sedated during the day.  ROS: Limited due to cognitive/behavioral   Objective:   No results found. Recent Labs    03/31/23 1420  WBC 3.6*  HGB 9.6*  HCT 32.8*  PLT 155     Recent Labs    03/31/23 1420 04/01/23 0816  NA 134* 134*  K 2.9* 4.0  CL 94* 97*  CO2 29 29  GLUCOSE 102* 105*  BUN 12 37*  CREATININE 2.13* 8.09*  CALCIUM 8.0* 8.7*      Intake/Output Summary (Last 24 hours) at 04/03/2023 1308 Last data filed at 04/03/2023 0844 Gross per 24 hour  Intake 474 ml  Output --  Net 474 ml         Physical Exam: Vital Signs Blood pressure (!) 171/73, pulse 64, temperature 98.1 F (36.7 C), resp. rate 18, height 5\' 4"  (1.626 m), weight 97.9 kg, SpO2 99%.  Gen: no distress, sitting in nurses station in his wheelchair HEENT: oral mucosa pink and moist, NCAT Cardio: RRR Chest: CTAB, normal effort, normal rate of breathing Abd: soft, non-distended, positive bowel sounds Ext: Trace bilateral lower extremity edema Skin: intact  Ext: no clubbing, cyanosis, or edema Psych: Confused, not agitated at this time Neurologic Exam:   Appears drowsy, Poor awareness and insight.  Appears calm at the moment Oriented to self, hospital + Memory deficit - ongoing + Poor insight Sensory exam: Intact to touch in all 4 extremities Motor exam: moves all 4 extremities without limitiations Coordination: Slight bilateral upper extremity tremor, intention, mild.    Musculoskeletal: No joint swelling noted No abnormal tone      Assessment/Plan: 1. Functional deficits which require 15 hr per week of interdisciplinary therapy in a comprehensive inpatient rehab setting. Physiatrist is providing close team supervision  and 24 hour management of active medical problems listed below. Physiatrist and rehab team continue to assess barriers to discharge/monitor patient progress toward functional and medical goals  Care Tool:  Bathing    Body parts bathed by patient: Right arm, Left arm, Chest, Abdomen, Front perineal area, Right upper leg, Buttocks, Left upper leg, Right lower leg, Left lower leg, Face         Bathing assist Assist Level: Moderate Assistance - Patient 50 - 74%     Upper Body Dressing/Undressing Upper body dressing   What is the patient wearing?: Pull over shirt    Upper body assist Assist Level: Minimal Assistance - Patient > 75%    Lower Body Dressing/Undressing      Lower body dressing      What is the patient wearing?: Pants     Lower body assist Assist for lower body dressing: Moderate Assistance - Patient 50 - 74%     Toileting Toileting    Toileting assist Assist for toileting: Moderate Assistance - Patient 50 - 74%     Transfers Chair/bed transfer  Transfers assist     Chair/bed transfer assist level: Minimal Assistance - Patient > 75%  Locomotion Ambulation   Ambulation assist      Assist level: Minimal Assistance - Patient > 75% Assistive device: No Device Max distance: 175'   Walk 10 feet activity   Assist     Assist level: Contact Guard/Touching assist Assistive device: No Device   Walk 50 feet activity   Assist Walk 50 feet with 2 turns activity did not occur: Safety/medical concerns  Assist level: Minimal Assistance - Patient > 75% Assistive device: No Device    Walk 150 feet activity   Assist Walk 150 feet activity did not occur: Safety/medical concerns  Assist level: Minimal Assistance - Patient > 75% Assistive device: No Device    Walk 10 feet on uneven surface  activity   Assist Walk 10 feet on uneven surfaces activity did not occur: Safety/medical concerns         Wheelchair     Assist Is the  patient using a wheelchair?: Yes Type of Wheelchair: Manual    Wheelchair assist level: Dependent - Patient 0%      Wheelchair 50 feet with 2 turns activity    Assist        Assist Level: Dependent - Patient 0%   Wheelchair 150 feet activity     Assist      Assist Level: Dependent - Patient 0%   Blood pressure (!) 171/73, pulse 64, temperature 98.1 F (36.7 C), resp. rate 18, height 5\' 4"  (1.626 m), weight 97.9 kg, SpO2 99%.  Medical Problem List and Plan: 1. Functional deficits secondary to metabolic encephalopathy due to sepsis secondary to catheter peritonitis and hypoxemia              -patient may not shower             -ELOS/Goals: 16-18 days, PT/OT Sup, SLP min A -tentative discharge date 9-9           -Continue CIR therapies including PT, OT, and SLP   -Will review records from [facility] -- has worsened since approximately 8/22, possible 8/19, consistent with records from nursing at [facility] with current behavior.    - continue enclosure bed for safety  -  therapy 15/7  -Team conference today please see physician documentation under team conference tab.   2.  Antithrombotics: -DVT/anticoagulation:  Pharmaceutical: Heparin, 03/23/23 changed to Lovenox yesterday to help improved compliance             -antiplatelet therapy: aspirin and Plavix   3. Pain Management: Tylenol as needed             -Lidoderm patches to back  -Elavil 25 mg at bedtime for nerve pain and sleep 8/27  -8/29: Pain well controlled on Elavil; DC lyrica, PRN Tylenol/oxy 2.5-5 mg d/t CKD  8-30: Appears patient has been getting pain medications together overnight along with antipsychotics, will discuss with nursing  8-31: Patient having an difficulty endorsing needs and not utilizing PRNs, scheduled oxycodone 5 mg 3 times daily and has 2.5 mg prn 3 times daily available.   9-1: Not utilizing PRNs, minimal complaints of pain, will discontinue scheduled given lethargy today..  -muscle rub R  shoulder  9/16 patient reports pain is under control overall   4. Mood/Behavior/Sleep: LCSW to evaluate and provide emotional support             -continue Seroquel 50 mg TID             -clonazepam 0.5 mg TID prn             -  Geodon 10 mg IM q 12 hours prn             -antipsychotic agents: n/a  -8-27: Agitated by soft restraints in bed, transition to 4 side rails, Posey alarm, and TeleSitter for now.  Veil bed on standby if needed, however feel we should avoid this if possible due to likelihood of agitating patient further   - 8/28: Placed in veil bed; wrist restraints for dialysis. Scheduled Geodon 20 mg twice daily; discontinue Seroquel and clonazepam due to no perceivable benefit. Add as needed Zyprexa 5 mg twice daily p.o. or IM due to recent cardiac arrhythmias and concern for QTc prolongation. As needed ramelteon for sleep. Stop gabapentin due to possible side effect of agitation  - 8/28: No improvement except temporarily with Geodon; add depakote 125 mg q8h for ongoing aggitation, mood stabilization. Increase elavil to 50 mg at bedtime.  Workup ongoing.    - 8/29: EEG negative, labs normal. MRI pending. Sleeping well and calm since starting depakote and increase in elavil. Will monitor tonight and wean Geodon in AM if appropriate.   8-30: MRI showing chronic microvascular changes, otherwise normal.  Increasingly calm and appropriate during the day, with ongoing agitation at nighttime.  Will increase nighttime Geodon to 40 mg; will discuss sleep log with nursing, as it has not been performed.  8-31 no sleep overnight per sleep log, again getting agitated around 9 PM and 5 AM.  Not responsive to as needed Zyprexa, responsive to Ativan.  Attempted to wean off scheduled a.m. Geodon today, but patient became extremely agitated and unable to redirect, calm down with readministration of medication.  Resume Geodon 20 mg every morning/40 mg every afternoon, increased Elavil to 75 mg nightly, adding  trazodone 50 mg nightly as needed.  P.o./IV Ativan 0.5 mg every 6 hours for agitation.  9-1: slept well overnight with trazodone and increased Elavil, no PRNs used.  Unfortunately, oversedated today.  Discontinue scheduled pain medications as above, start wean of Elavil to 50 mg x2 days -> 25 mg x2 days -> 10 mg x2 days, off due to no apparent benefit and no complaints of pain.  Reduce Geodon from 20 mg every morning/40 mg every evening to 20 mg twice daily.  Continue trazodone as needed for sleep, as this seems to be the difference for a restful night yesterday.  If he remains overly sedated, would dose reduce and DC Depakote next.  Keep Ativan as needed d/t dialysis not having other options available.  9/2-3--non agitated but still sleepy   -dc'ed depakote as I doubt it's doing much at this dose anyway   -continue geodon at 20mg  bid   -scheduled trazodone at HS with back up dose if needed    -sleep was intermittent last night   -requires enclosure bed for safety  9/4 continue enclosure bed for now, consider trying DC of this device early next week  -9/5 appears to be doing better overall this morning, continues to have intermittent agitation at times.  Continue enclosure bed  -9/6 agitation appears to be improving, patient is skeptical about medications and has been declining some of these.  Will discontinue multivitamin, RisaQuad to try to decrease pill burden  -9/7 continues to have intermittent episodes of agitation however overall improving, appears to be taking most of his medications  9/9 agitation and mental status appears to be improving -discussing discontinuation of enclosure bed with team 9/10 Geodon dose increased to 40 mg at night, continue 20 mg in  the morning 9/11 No acute events noted, will check with team regarding DC enclosure bed, nursing reports he did ok last night but had dialysis 9/12 continues to have sundowning/waxing waning confusion and agitation, continue enclosure bed  for now, will caution with Geodon will continue current dose but hold off on any escalation as this can contribute to bradycardia 9/16 no longer using enclosure bed, continue Geodon 20 mg twice daily for now due to intermittent agitation 9/17 patient with agitation and confusion at night, has sedation during the day.  Discussed with psychiatry Dr. Gasper Sells.  Will discontinue Geodon and try Abilify tonight.  Dr. Gasper Sells also suggested considering donepezil to shorten hallucinations of delirium.  Psychiatry consult placed, will see tomorrow.  Appreciate assistance 5. Neuropsych/cognition: This patient is not capable of making decisions on his own behalf   -recommend neuropsych consult   - 8/27: Ongoing aggitation; Consult hospitalist for further workup on potential contributors to encephalopathy. Pending UA, LA 2.1-> 1.4; otherwise negative so far -Delirium precautions, sleep log, Veiled bed for safety and to minimize restraints -Wife has expressed that she is not interested in palliative care at this time; hopefully with above measures patient can get dialysis in the next 48 hours to limit worsening uremic encephalopathy and medical decline    - 8/29: Aggitation much improved, able to get some dialysis last night and today. See above for medication changes. Calling wife tonight to discuss.   8-30: Still with moderate cognitive deficits, however appears once patient is less agitated he is better able to concentrate and has better orientation.  MRI negative for acute findings, monitor.  9/3 agitation better. More sedated than anything at this point. Confused as well.    -depakote stopped   -continue other meds as rx'ed  6. Skin/Wound Care: Routine skin care checks             -sacral skin off-loading/protection   7. Fluids/Electrolytes/Nutrition: Strict Is and Os and follow-up chemistries per nephrology             -renal diet/ 1200 cc FR  - Renvela 800 mg 3 times daily for hyperphosphatemia  added per nephrology 8-27   8: Hypotension: monitor TID and prn             -continue midodrine as needed during dialysis  Normotensive, monitor  9-1: Consistently 90s over 60s, has been stable but given findings of microvascular changes on MRI, may be too low for patient to perfuse properly.  Will add midodrine 5 mg tid and monitor.  DC Coreg as heart rate has been low to normal; may resume at low-dose once blood pressure improved.   9/2 continue with ccurrent regimen. Bp's hr remain low   -elavil wean should help  9/9-10 BP stable, continue midodrine and monitor  9/13 DC midodrine today as BP has been higher, can restart if needed  9/17 BP has been higher lately continue to monitor     04/02/2023   10:00 PM 04/02/2023    2:35 PM 04/02/2023    1:19 PM  Vitals with BMI  Systolic 171 140 657  Diastolic 73 59 87  Pulse 64 53 61      9: Hyperlipidemia: continue statin   10: CAD s/p CABG 2023: on DAPT and statin.  - troponin elevated but stable, monitor 8/27   11: DM-2: A1c = 6.3% (at home on Lantus 22 units at bedtime, Tradjenta 5 mg daily)        -monitor PO intake             -  continue SSI (0-6 units) does not appear to be requiring coverage   - 9/6 DC SSI, change CBGs to daily  9/8 discontinue CBGs  9/16 glucose still controlled on BMP yesterday   No results for input(s): "GLUCAP" in the last 72 hours.    12: ESRD: HD on Tues, Thurs, Sat -nephrology made aware   - Unable to tolerate 8/27 d/t aggitation, stopped early 8/28;  - attempting again 8/29, stopped early again -Per nephrology, redirectable with gum -Have to switch restraints during dialysis sessions from veil bed to regular bed, then resume veil bed order once at rehab -9/9 tolerating HD better 9/17 hemodialysis today per nephrology, hopefully he will tolerate with less agitation today   13: COPD/OSA; chronic home 2L O2 (Symbicort, Ventolin, prednisone 40 mg daily at home)             -continue symbicort  -Added  home oxygen back 8-27   14: GERD: continue Protonix   15: Non-sustained VT/pAF: per cardiology, Dr. Algie Coffer: -continue carvedilol 6.25 mg BID; consider increasing to 12.5 mg BID before starting low dose amiodarone  -consider Eliquis and discontinue asa and Plavix in one week if no active GI bleeding and stable hemoglobin from 8/24 -hemoglobin has remained stable but very low around 7, no recurrent A-fib, monitor for now given agitation/fall 8-31 and consider initiating Eliquis when safe -Continue mag-ox to avoid hypomagnesemia -8-27: Single instance of bradycardia this morning 40s, may have been missed read, monitor through today and do not further adjust beta-blocker medications  -9-3 hr controlled after holding coreg -9/4-5 HR stable, continue to monitor -9/9 consider Eliquis this week if agitation continues to improve -9/10 will hold off on Eliquis due to sundowning at night, will see how he does with increase Geodon -9/11 EKG for bradycardia episode today, 2nd degree A-V block mobitz type 1.  Continue to hold beta blocker,9/11 will restart eliquis at 2.5 mg BID dose. Will consult cardiology regarding need for ASA/plavix and coreg.  9/12 appreciate cardiology consult yesterday, DAPT transition to daily aspirin 81 only and Eliquis was discontinued, caution with Geodon as this can contribute to bradycardia 9/13 will decrease Geodon nighttime dose to 20 due to episodes of bradycardia 9/17 will discontinue Geodon due to variable cardiac effects     04/02/2023   10:00 PM 04/02/2023    2:35 PM 04/02/2023    1:19 PM  Vitals with BMI  Systolic 171 140 952  Diastolic 73 59 87  Pulse 64 53 61       16: HFrEF: Daily weights. Monitor for signs of overload  - 9/7 continue daily weights, fluid level management per hemodialysis team Weight reviewed and stable  Filed Weights   03/31/23 1820 04/01/23 0353 04/02/23 0459  Weight: 97.8 kg 98.1 kg 97.9 kg      17: Liver nodularity on CT scan:  LFTs reported to be normal. Avoid hepatotoxic medications    -Abdominal ultrasound earlier this admission with minimal ascites; ammonia within normal limits  -8/31: Did discuss with nephrology whether p.o. lactulose would be beneficial despite normal ammonia, after reviewing records unsure that this would be beneficial    18: Epistaxis - resolved             -previously improved with flonase and saline spray   19: Morbid Obesity. Dietary counseling Body mass index is 39.01 kg/m.  20. Bradycardic: continue to monitor HR TID  -Discontinue Geodon  21. HTN: resolved after HD, continue to monitor TID  LOS: 22 days A FACE TO FACE EVALUATION WAS PERFORMED  Fanny Dance 04/03/2023, 1:08 PM

## 2023-04-04 ENCOUNTER — Encounter (HOSPITAL_COMMUNITY): Payer: Medicare Other

## 2023-04-04 DIAGNOSIS — G9341 Metabolic encephalopathy: Secondary | ICD-10-CM | POA: Diagnosis not present

## 2023-04-04 DIAGNOSIS — I10 Essential (primary) hypertension: Secondary | ICD-10-CM

## 2023-04-04 NOTE — Progress Notes (Signed)
Occupational Therapy Weekly Progress Note  Patient Details  Name: [name] [name] MRN: [id number] Date of Birth: 1951-01-07  Beginning of progress report period: March 26, 2023 End of progress report period: April 04, 2023  Today's Date: 04/03/2023 OT Individual Time:  - late entry 9:15-10 ( )      Patient has continues to work towards meeting the short term goals which = the long term goals.   Pt can be overall close supervision to min A for functional ADLs and functional ambulation with HHA to and from the bathroom and around the room. However pt's current behaviors of sundowning, hallucinations and irritability specially in the night have lead to some falls and impacted his ability to do HD at times. It has also impacted his sleep wake cycles.   Currently plan is for pt to d/c to SNF since his wife not able to manage him with his behaviors. His plan of care has changed to q d at this time.   Patient continues to demonstrate the following deficits: muscle weakness, decreased cardiorespiratoy endurance, decreased initiation, decreased attention, decreased awareness, decreased problem solving, decreased safety awareness, decreased memory, and delayed processing, and decreased standing balance and decreased balance strategies and therefore will continue to benefit from skilled OT intervention to enhance overall performance with BADL and Reduce care partner burden.  Patient progressing toward long term goals..  Continue plan of care.  OT Short Term Goals Week 3:  OT Short Term Goal 1 (Week 3): LTG=STG 2/2 ELOS Week 4:  OT Short Term Goal 1 (Week 4): To continue to consistently meet LTG.  Skilled Therapeutic Interventions/Progress Updates:    1:1 Pt received at the RN station - starting to eat breakfast. Pt appears to be more tired and keeping his eyes closed often. He is not as engaged in conversation as he usually can be. Food was cut up and assisted with fixing his coffee. Pt put  his fork with food on it in his coffee (making a mess) with each bite requiring max A to redirect and assist in eating today. Pt also with difficult getting grapes out of container without spilling them. Pt finished breakfast and was taken back to the room and engaged in UB bathing and dressing and grooming tasks at the sink. Pt required setup for face washing and tooth brushing. Pt did required A with spitting - he missed the sink twice even though he was sitting in front of it. Pt engaged in washing UB but required max A to wash thoroughly and effectively. Pt donned shirt with difficulty as well requiring A to thread correct body parts in the correct place- requiring max A. Pt returned to rN station for supervision with seat belt alarm.    Therapy Documentation Precautions:  Precautions Precautions: Fall Restrictions Weight Bearing Restrictions: No General:   Vital Signs: Oxygen Therapy O2 Device: Room Air FiO2 (%): 94 % Pain:  No c/o pain in session    Therapy/Group: Individual Therapy  [name] [facility] 04/04/2023, 7:34 AM

## 2023-04-04 NOTE — Plan of Care (Signed)
Problem: Consults Goal: RH GENERAL PATIENT EDUCATION Description: See Patient Education module for education specifics. Outcome: Progressing   Problem: RH BOWEL ELIMINATION Goal: RH STG MANAGE BOWEL WITH ASSISTANCE Description: STG Manage Bowel with mod I Assistance. Outcome: Progressing Goal: RH STG MANAGE BOWEL W/MEDICATION W/ASSISTANCE Description: STG Manage Bowel with Medication with mod I Assistance. Outcome: Progressing   Problem: RH SAFETY Goal: RH STG ADHERE TO SAFETY PRECAUTIONS W/ASSISTANCE/DEVICE Description: STG Adhere to Safety Precautions With cues Assistance/Device. Outcome: Progressing   Problem: RH PAIN MANAGEMENT Goal: RH STG PAIN MANAGED AT OR BELOW PT'S PAIN GOAL Description: < 4 with prns Outcome: Progressing   Problem: RH KNOWLEDGE DEFICIT GENERAL Goal: RH STG INCREASE KNOWLEDGE OF SELF CARE AFTER HOSPITALIZATION Description: Patient and wife will be able to manage care at discharge with educational resources independently Outcome: Progressing   Problem: Safety: Goal: Non-violent Restraint(s) Outcome: Progressing   Problem: Education: Goal: Ability to describe self-care measures that may prevent or decrease complications (Diabetes Survival Skills Education) will improve Outcome: Progressing Goal: Individualized Educational Video(s) Outcome: Progressing   Problem: Coping: Goal: Ability to adjust to condition or change in health will improve Outcome: Progressing   Problem: Fluid Volume: Goal: Ability to maintain a balanced intake and output will improve Outcome: Progressing   Problem: Health Behavior/Discharge Planning: Goal: Ability to identify and utilize available resources and services will improve Outcome: Progressing Goal: Ability to manage health-related needs will improve Outcome: Progressing   Problem: Metabolic: Goal: Ability to maintain appropriate glucose levels will improve Outcome: Progressing   Problem: Nutritional: Goal:  Maintenance of adequate nutrition will improve Outcome: Progressing Goal: Progress toward achieving an optimal weight will improve Outcome: Progressing   Problem: Skin Integrity: Goal: Risk for impaired skin integrity will decrease Outcome: Progressing   Problem: Tissue Perfusion: Goal: Adequacy of tissue perfusion will improve Outcome: Progressing   Problem: Education: Goal: Knowledge of General Education information will improve Description: Including pain rating scale, medication(s)/side effects and non-pharmacologic comfort measures Outcome: Progressing   Problem: Health Behavior/Discharge Planning: Goal: Ability to manage health-related needs will improve Outcome: Progressing   Problem: Clinical Measurements: Goal: Ability to maintain clinical measurements within normal limits will improve Outcome: Progressing Goal: Will remain free from infection Outcome: Progressing Goal: Diagnostic test results will improve Outcome: Progressing Goal: Respiratory complications will improve Outcome: Progressing Goal: Cardiovascular complication will be avoided Outcome: Progressing   Problem: Activity: Goal: Risk for activity intolerance will decrease Outcome: Progressing   Problem: Nutrition: Goal: Adequate nutrition will be maintained Outcome: Progressing   Problem: Coping: Goal: Level of anxiety will decrease Outcome: Progressing   Problem: Elimination: Goal: Will not experience complications related to bowel motility Outcome: Progressing Goal: Will not experience complications related to urinary retention Outcome: Progressing   Problem: Pain Managment: Goal: General experience of comfort will improve Outcome: Progressing   Problem: Safety: Goal: Ability to remain free from injury will improve Outcome: Progressing   Problem: Skin Integrity: Goal: Risk for impaired skin integrity will decrease Outcome: Progressing   Problem: Education: Goal: Will demonstrate  proper wound care and an understanding of methods to prevent future damage Outcome: Progressing Goal: Knowledge of disease or condition will improve Outcome: Progressing Goal: Knowledge of the prescribed therapeutic regimen will improve Outcome: Progressing Goal: Individualized Educational Video(s) Outcome: Progressing   Problem: Activity: Goal: Risk for activity intolerance will decrease Outcome: Progressing   Problem: Cardiac: Goal: Will achieve and/or maintain hemodynamic stability Outcome: Progressing   Problem: Clinical Measurements: Goal: Postoperative complications will be avoided or  minimized Outcome: Progressing   Problem: Respiratory: Goal: Respiratory status will improve Outcome: Progressing   Problem: Skin Integrity: Goal: Wound healing without signs and symptoms of infection Outcome: Progressing Goal: Risk for impaired skin integrity will decrease Outcome: Progressing   Problem: Urinary Elimination: Goal: Ability to achieve and maintain adequate renal perfusion and functioning will improve Outcome: Progressing   Problem: Education: Goal: Understanding of cardiac disease, CV risk reduction, and recovery process will improve Outcome: Progressing Goal: Individualized Educational Video(s) Outcome: Progressing   Problem: Activity: Goal: Ability to tolerate increased activity will improve Outcome: Progressing   Problem: Cardiac: Goal: Ability to achieve and maintain adequate cardiovascular perfusion will improve Outcome: Progressing   Problem: Health Behavior/Discharge Planning: Goal: Ability to safely manage health-related needs after discharge will improve Outcome: Progressing   Problem: Education: Goal: Understanding of CV disease, CV risk reduction, and recovery process will improve Outcome: Progressing Goal: Individualized Educational Video(s) Outcome: Progressing   Problem: Activity: Goal: Ability to return to baseline activity level will  improve Outcome: Progressing   Problem: Cardiovascular: Goal: Ability to achieve and maintain adequate cardiovascular perfusion will improve Outcome: Progressing Goal: Vascular access site(s) Level 0-1 will be maintained Outcome: Progressing   Problem: Health Behavior/Discharge Planning: Goal: Ability to safely manage health-related needs after discharge will improve Outcome: Progressing   Problem: Education: Goal: Knowledge of cardiac device and self-care will improve Outcome: Progressing Goal: Ability to safely manage health related needs after discharge will improve Outcome: Progressing Goal: Individualized Educational Video(s) Outcome: Progressing   Problem: Cardiac: Goal: Ability to achieve and maintain adequate cardiopulmonary perfusion will improve Outcome: Progressing

## 2023-04-04 NOTE — Consult Note (Signed)
[name] Consult Evaluation  Service Date: April 04, 2023 LOS:  LOS: 23 days    Primary Psychiatric Diagnoses  Delirium 2.  Acute metabolic encephalopathy   Assessment  [name] [name] is a 72 y.o. male admitted medically to the hospitalists service on 02/15/23 for confusion and SOB in the context of a recent COVID infection and admitted to acute care on 03/12/2023  4:25 PM for acute metabolic encephalopathy assumed due to recent COVID infection.  He has no past psychiatric diagnosis and has a past medical history of COPD, IDT2DM, GERD, and ESRD. Psychiatry was consulted for encephalopathy and agitation by Dr. Natale Lay.  At this time, the patient's presentation is most consistent with hypoactive delirium, given relatively acute onset, visual hallucinations, and agitation worse at night. Most likely due to multiple etiologies including but not limited to infection, medications, pain, altered sleep/wake cycle, and limited mobility.   During this time period, minimization of delirogenic insults will be of utmost importance; this includes promoting the normal circadian cycle, minimizing lines/tubes, avoiding deliriogenic medications such as benzodiazepines and anticholinergic medications, and frequently reorienting the patient. Symptomatic treatment for agitation can be provided by antipsychotic medications, though it is important to remember that these do not treat the underlying etiology of delirium. Notably, there can be a time lag effect between treatment of a medical problem and resolution of delirium. This time lag effect may be of longer duration in the elderly, and those with underlying cognitive impairment or brain injury.   Diagnoses:  Active Hospital problems: Principal Problem:   Acute metabolic encephalopathy Active Problems:   Type 2 diabetes mellitus with chronic kidney disease on chronic dialysis, with long-term current use of insulin (HCC)   HFrEF (heart  failure with reduced ejection fraction) (HCC)   Morbid obesity (HCC)   Encephalopathy   Epistaxis   PAF (paroxysmal atrial fibrillation) (HCC)   Long term (current) use of insulin (HCC)   ESRD on dialysis (HCC)   Debility     Plan   ## Psychiatric Medication Recommendations:  -- Abilify 5 mg, PO, daily at bedtime  ## Medical Decision Making Capacity:  -- Clearly lacks for most decisions; wife is listed as HCPOA  ## Further Work-up:  -- Vitamin C and Vitamin D to rule out nutritional dementia -- Most recent EKG on 03/02/23 had QtC of 469 -- Pertinent labwork reviewed earlier this admission includes: reasonably extensive work-up largely wnl with the exception of labs associated with ESRD including low eGFR, high BUN, and high albumin, all of which are associated with an increased risk of dementia, delirium, and acute metabolic encephalopathy.  ## Disposition:  -- Patient's wife prefers that patient be discharged to a rehab facility; if not possible she prefers he be discharged home and to transport patient to a dialysis center as needed. No psychiatric contraindications to this plan - defer to PT, OT, and medical assessment. Patient's wife states that a family member can accompany him to dialysis center to help reduce the risk and complications of agitation.  ## Behavioral / Environmental:  --  Delirium Precautions: Delirium Interventions for Nursing and Staff: - RN to open blinds every AM. - To Bedside: Glasses and pt's own shoes. Make available to patient when possible and encourage use. - Encourage po fluids when appropriate, keep fluids within reach. - OOB to chair with meals. - Passive ROM exercises to all extremities with AM & PM care. - RN to assess orientation to person, time and place QAM  and PRN. - Recommend extended visitation hours with familiar family/friends as feasible. - Staff to minimize disturbances at night. Turn off television when pt asleep or when not in use.   - When patient is awake and watching TV, his wife reports that he likes watching western movies, racing, boxing, or [place]  ## Safety and Observation Level:  - Based on my clinical evaluation, I estimate the patient to be at low risk of self harm in the current setting - At this time, we recommend a routine level of observation. This decision is based on my review of the chart including patient's history and current presentation, interview of the patient, mental status examination, and consideration of suicide risk including evaluating suicidal ideation, plan, intent, suicidal or self-harm behaviors, risk factors, and protective factors. This judgment is based on our ability to directly address suicide risk, implement suicide prevention strategies and develop a safety plan while the patient is in the clinical setting. Please contact our team if there is a concern that risk level has changed.  Suicide risk assessment: Patient has following modifiable risk factors for suicide: access to guns, which we are addressing by the patient's wife planning to lock up the guns prior to the patient returning home.   Patient has following non-modifiable or demographic risk factors for suicide: male gender and psychiatric hospitalization  Patient has the following protective factors against suicide: Supportive family, Supportive friends, and no history of suicide attempts  Thank you for this consult request. Recommendations have been communicated to the primary team.  We will continue to follow at this time.   [place] E Kanyon Seibold, Medical Student  Psychiatric and Social History   Relevant Aspects of Hospital Course:  Admitted on 02/15/23 for confusion and SOB in the context of a recent COVID infection. He was found to have sepsis secondary to peritoneal dialysis associated catheter peritonitis and polymicrobial bacteremia/fungemia. Broad spectrum antibiotics started. Hospital course complicated by worsening  encephalopathy with hypoxemia requiring transfer to the ICU.   Patient Report:  Patient was highly somnolent, but cooperative throughout interview. Patient would briefly arouse to calls of his first name and was able to answer questions about concrete objects.  Psych ROS:  Depression: In hospital, patient displays signs of sleep disturbance, fatigue, diminished concentration and cognition, and psychomotor changes. 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  Anxiety:  Not formally assessed Mania (lifetime and current): No reported symptoms of mania  Psychosis: (lifetime and current): Patient's wife reports that over the course of his hospital stay, at times the patient will have hallucinations about deceased or living family members who are not currently in the room with him.  Collateral information:  Contacted patient's wife, Jaython Stofflet, at 3:35 pm on 04/04/23. She states that she first noticed changes in the patient's mental status shortly after patient had COVID. She states that he tested positive for COVID at an urgent care in [place] on 01/28/23 and that around that time he began becoming more forgetful. On 02/14/23 the patient's wife observed that he thought he was in the bathroom when he was in the bedroom, and that he had recently been running a fever of 101.3 before she gave him a bath to cool him down, so she called an ambulance. He was transported to [facility] and subsequently transferred to [facility]. She reports that over the  course of his 7-week hospital stay, he has become progressively worse. She reports that in the first few days of admission he was able to request specific television shows to watch, but that he has become progressively somnolent and has had periods with hallucinations. She reports that he has at times called out to deceased or  living relatives who were not in the room.   Psychiatric History:  Information collected from patient's wife and EMR  Prev Psych Dx/Sx: None Current Psych Provider: None Home Meds (current): None Previous Psych Med Trials: No past history Therapy: No past history  Prior ECT: Wife denies Prior Psych Hospitalization: Wife denies Prior Self Harm: Wife denies Prior Violence: Wife denies  Family Psych History: Wife states that there is no known psych history in the patient's family Family Hx suicide: Wife denies  Social History:  Developmental Hx: None on record Educational Hx: Deferred Occupational Hx: Patient worked as a Naval architect for 32 years Legal Hx: Wife states patient currently has no legal issues Living Situation: Lives with wife at home Spiritual Hx: Patient normally attends church every Sunday and participates in Sunday school Access to weapons: Wife states that there are currently guns in the home, but she plans to have them locked up prior to the patient returning home  Substance History Tobacco use: Wife states patient doe snot use tobacco Alcohol use: Wife states patient does not use alcohol Drug use: Wife states patient does not use drugs   Exam Findings   Psychiatric Specialty Exam: Patient is oriented to self and arouses to the call of his first name. He is aware that he is in a hospital, but when asked where the hospital is located, he states [place]. Given somnolence and poor attention, not able to assess insight, judgement, thought process, SI, HI, or AH/VH. When asked, patient states his mood is "good." Relatively flat affect.  Physical Exam: Vital signs:  Temp:  [98.2 F (36.8 C)] 98.2 F (36.8 C) (09/18 1234) Pulse Rate:  [68-91] 88 (09/18 1234) Resp:  [11-28] 16 (09/18 1234) BP: (107-156)/(78-102) 107/81 (09/18 1234) SpO2:  [84 %-100 %] 100 % (09/18 1234) FiO2 (%):  [94 %] 94 % (09/18 0619)  Physical Exam HENT:     Head: Normocephalic and  atraumatic.  Cardiovascular:     Rate and Rhythm: Normal rate.  Pulmonary:     Effort: Pulmonary effort is normal.  Neurological:     Mental Status: He is disoriented.    Blood pressure 107/81, pulse 88, temperature 98.2 F (36.8 C), resp. rate 16, height 5\' 4"  (1.626 m), weight 99.6 kg, SpO2 100%. Body mass index is 37.69 kg/m.   Other History   These have been pulled in through the EMR, reviewed, and updated if appropriate.   Family History:  The patient's family history includes Arthritis in an other family member; Asthma in an other family member; Cancer in his mother; Diabetes in his father, mother, and sister; Hypertension in his father, mother, and sister; Lung disease in an other family member.  Medical History: Past Medical History:  Diagnosis Date   Anemia    Arthritis    Asthma    Bell palsy    CAD (coronary artery disease)    a. 2014 MV: abnl w/ infap ischemia; b. 03/2013 Cath: aneurysmal bleb in the LAD w/ otw nonobs dzs-->Med Rx.   Chronic back pain    Chronic knee pain    a. 09/2015 s/p R TKA.   Chronic pain  Chronic shoulder pain    Chronic sinusitis    COPD (chronic obstructive pulmonary disease) (HCC)    Diabetes mellitus without complication (HCC)    type II    ESRD on peritoneal dialysis (HCC)    on peritoneal dialysis, [facility]   Essential hypertension    GERD (gastroesophageal reflux disease)    Gout    Gout    Hepatomegaly    noted on noncontrast CT 2015   History of hiatal hernia    Hyperlipidemia    Lateral meniscus tear    Obesity    Truncal   Obstructive sleep apnea    does not use cpap    On home oxygen therapy    uses 2l when is going somewhere per patient    PUD (peptic ulcer disease)    remote, reports f/u EGD about 8 years ago unremarkable    Reactive airway disease    related to exposure to chemical during 9/11   Sinusitis    Vitamin D deficiency     Surgical History: Past Surgical History:  Procedure  Laterality Date   A/V FISTULAGRAM N/A 02/28/2023   Procedure: A/V Fistulagram;  Surgeon: Victorino Sparrow, MD;  Location: [facility] INVASIVE CV LAB;  Service: Cardiovascular;  Laterality: N/A;   ASAD LT SHOULDER  12/15/2008   left shoulder   AV FISTULA PLACEMENT Left 08/09/2016   Procedure: BRACHIOCEPHALIC ARTERIOVENOUS (AV) FISTULA CREATION LEFT ARM;  Surgeon: Sherren Kerns, MD;  Location: [facility] OR;  Service: Vascular;  Laterality: Left;   CAPD INSERTION N/A 10/07/2018   Procedure: LAPAROSCOPIC PERITONEAL CATHETER PLACEMENT;  Surgeon: Rodman Pickle, MD;  Location: [facility] ORS;  Service: General;  Laterality: N/A;   CATARACT EXTRACTION W/PHACO Left 03/28/2016   Procedure: CATARACT EXTRACTION PHACO AND INTRAOCULAR LENS PLACEMENT LEFT EYE;  Surgeon: Jethro Bolus, MD;  Location: [facility] ORS;  Service: Ophthalmology;  Laterality: Left;  CDE: 4.77   CATARACT EXTRACTION W/PHACO Right 04/11/2016   Procedure: CATARACT EXTRACTION PHACO AND INTRAOCULAR LENS PLACEMENT RIGHT EYE; CDE:  4.74;  Surgeon: Jethro Bolus, MD;  Location: [facility] ORS;  Service: Ophthalmology;  Laterality: Right;   COLONOSCOPY  10/15/2008   Fields: Rectal polyp obliterated, not retrieved, hemorrhoids, single ascending colon diverticulum near the CV. Next colonoscopy April 2020   COLONOSCOPY N/A 12/25/2014   SLF: 1. Colorectal polyps (2) removed 2. Small internal hemorrhoids 3. the left colon is severely redundant. hyperplastic polyps   CORONARY ARTERY BYPASS GRAFT N/A 06/05/2022   Procedure: OFF PUMP CORONARY ARTERY BYPASS GRAFTING (CABG) X 2 BYPASSES USING LEFT INTERNAL MAMMARY ARTERY AND RIGHT LEG GREATER SAPHENOUS VEIN HARVESTED ENDOSCOPICALLY;  Surgeon: Corliss Skains, MD;  Location: [facility] OR;  Service: Open Heart Surgery;  Laterality: N/A;   CORONARY STENT INTERVENTION N/A 07/25/2021   Procedure: CORONARY STENT INTERVENTION;  Surgeon: Tonny Bollman, MD;  Location: [facility] INVASIVE CV LAB;  Service: Cardiovascular;  Laterality: N/A;   CORONARY  STENT INTERVENTION N/A 12/26/2021   Procedure: CORONARY STENT INTERVENTION;  Surgeon: Yvonne Kendall, MD;  Location: [facility] INVASIVE CV LAB;  Service: Cardiovascular;  Laterality: N/A;   CORONARY STENT INTERVENTION N/A 01/20/2022   Procedure: CORONARY STENT INTERVENTION;  Surgeon: Tonny Bollman, MD;  Location: [facility] INVASIVE CV LAB;  Service: Cardiovascular;  Laterality: N/A;   DOPPLER ECHOCARDIOGRAPHY     ESOPHAGOGASTRODUODENOSCOPY N/A 12/25/2014   SLF: 1. Anemia most likely due to CRI, gastritis, gastric polyps 2. Moderate non-erosive gastriits and mild duodenitis.  3.TWo large gstric polyps removed.    EYE  SURGERY  12/22/2010   tear duct probing-[place]   FOREIGN BODY REMOVAL  03/29/2011   Procedure: REMOVAL FOREIGN BODY EXTREMITY;  Surgeon: Fuller Canada, MD;  Location: [facility]S;  Service: Orthopedics;  Laterality: Right;  Removal Foreign Body Right Thumb   IR FLUORO GUIDE CV LINE RIGHT  08/06/2018   IR FLUORO GUIDE CV LINE RIGHT  02/17/2023   IR US GUIDE VASC ACCESS RIGHT  08/06/2018   IR US GUIDE VASC ACCESS RIGHT  02/17/2023   KNEE ARTHROSCOPY  10/16/2007   left   KNEE ARTHROSCOPY WITH LATERAL MENISECTOMY Right 10/14/2015   Procedure: LEFT KNEE ARTHROSCOPY WITH PARTIAL LATERAL MENISECTOMY;  Surgeon: Vickki Hearing, MD;  Location: [facility]S;  Service: Orthopedics;  Laterality: Right;   LEFT HEART CATH AND CORONARY ANGIOGRAPHY N/A 07/25/2021   Procedure: LEFT HEART CATH AND CORONARY ANGIOGRAPHY;  Surgeon: Tonny Bollman, MD;  Location: [facility] INVASIVE CV LAB;  Service: Cardiovascular;  Laterality: N/A;   LEFT HEART CATH AND CORONARY ANGIOGRAPHY N/A 12/26/2021   Procedure: LEFT HEART CATH AND CORONARY ANGIOGRAPHY;  Surgeon: Yvonne Kendall, MD;  Location: [facility] INVASIVE CV LAB;  Service: Cardiovascular;  Laterality: N/A;   LEFT HEART CATH AND CORONARY ANGIOGRAPHY N/A 01/20/2022   Procedure: LEFT HEART CATH AND CORONARY ANGIOGRAPHY;  Surgeon: Tonny Bollman, MD;  Location: [facility] INVASIVE CV LAB;  Service:  Cardiovascular;  Laterality: N/A;   LEFT HEART CATHETERIZATION WITH CORONARY ANGIOGRAM N/A 03/28/2013   Procedure: LEFT HEART CATHETERIZATION WITH CORONARY ANGIOGRAM;  Surgeon: Marykay Lex, MD;  Location: [facility] CATH LAB;  Service: Cardiovascular;  Laterality: N/A;   NM MYOVIEW LTD     PENILE PROSTHESIS IMPLANT N/A 08/16/2015   Procedure: PENILE PROTHESIS INFLATABLE, three piece, Excisional biopsy of Penile ulcer, Penile molding;  Surgeon: Jethro Bolus, MD;  Location: [facility] ORS;  Service: Urology;  Laterality: N/A;   PENILE PROSTHESIS IMPLANT N/A 12/24/2017   Procedure: REMOVAL AND  REPLACEMENT  COLOPLAST PENILE PROSTHESIS;  Surgeon: Crista Elliot, MD;  Location: [facility]S;  Service: Urology;  Laterality: N/A;   PERIPHERAL VASCULAR BALLOON ANGIOPLASTY  02/28/2023   Procedure: PERIPHERAL VASCULAR BALLOON ANGIOPLASTY;  Surgeon: Victorino Sparrow, MD;  Location: [facility] INVASIVE CV LAB;  Service: Cardiovascular;;   PORT-A-CATH REMOVAL N/A 02/19/2023   Procedure: REMOVAL OF PERITONEAL DIALYSIS CATHETER;  Surgeon: Victorino Sparrow, MD;  Location: [facility] OR;  Service: Vascular;  Laterality: N/A;   QUADRICEPS TENDON REPAIR  07/21/2011   Procedure: REPAIR QUADRICEP TENDON;  Surgeon: Fuller Canada, MD;  Location: [facility]S;  Service: Orthopedics;  Laterality: Right;   RIGHT/LEFT HEART CATH AND CORONARY ANGIOGRAPHY N/A 05/30/2022   Procedure: RIGHT/LEFT HEART CATH AND CORONARY ANGIOGRAPHY;  Surgeon: Corky Crafts, MD;  Location: [facility] INVASIVE CV LAB;  Service: Cardiovascular;  Laterality: N/A;   TEE WITHOUT CARDIOVERSION N/A 06/05/2022   Procedure: TRANSESOPHAGEAL ECHOCARDIOGRAM (TEE);  Surgeon: Corliss Skains, MD;  Location: [facility] OR;  Service: Open Heart Surgery;  Laterality: N/A;   TOENAIL EXCISION     removed x2-bilateral   UMBILICAL HERNIA REPAIR  07/17/2005   [place]    Medications:   Current Facility-Administered Medications:    (feeding supplement) PROSource Plus liquid 30 mL, 30 mL, Oral,  BID BM, Stovall, Kathryn R, PA-C, 30 mL at 03/31/23 0900   acetaminophen (TYLENOL) tablet 325-650 mg, 325-650 mg, Oral, Q4H PRN, Milinda Antis, PA-C, 650 mg at 03/26/23 2002   ARIPiprazole (ABILIFY) tablet 5 mg, 5 mg, Oral, QHS, Fanny Dance, MD, 5 mg at 04/03/23  2216   aspirin EC tablet 81 mg, 81 mg, Oral, Daily, Milinda Antis, PA-C, 81 mg at 04/03/23 0741   atorvastatin (LIPITOR) tablet 80 mg, 80 mg, Oral, Daily, Milinda Antis, PA-C, 80 mg at 04/03/23 0741   bisacodyl (DULCOLAX) EC tablet 5 mg, 5 mg, Oral, Daily PRN, Milinda Antis, PA-C   Chlorhexidine Gluconate Cloth 2 % PADS 6 each, 6 each, Topical, Q0600, Tomasa Blase, PA-C, 6 each at 04/04/23 0536   Darbepoetin Alfa (ARANESP) injection 150 mcg, 150 mcg, Subcutaneous, Q Sun-1800, Paytes, Austin A, RPH, 150 mcg at 03/25/23 1750   divalproex (DEPAKOTE SPRINKLE) capsule 125 mg, 125 mg, Oral, Q8H, Engler, Morgan C, DO, 125 mg at 04/03/23 2216   feeding supplement (ENSURE ENLIVE / ENSURE PLUS) liquid 237 mL, 237 mL, Oral, BID WC, Setzer, Sandra J, PA-C, 237 mL at 04/03/23 2217   Gerhardt's butt cream 1 Application, 1 Application, Topical, QID, Setzer, Lynnell Jude, PA-C, 1 Application at 04/03/23 2218   guaiFENesin-dextromethorphan (ROBITUSSIN DM) 100-10 MG/5ML syrup 10 mL, 10 mL, Oral, Q6H PRN, Milinda Antis, PA-C, 10 mL at 04/03/23 1633   lidocaine (LIDODERM) 5 % 1 patch, 1 patch, Transdermal, Q2200, Setzer, Lynnell Jude, PA-C, 1 patch at 03/31/23 2136   lidocaine (LIDODERM) 5 % 1 patch, 1 patch, Transdermal, Q24H, Skip Mayer A, MD, 1 patch at 04/01/23 1955   LORazepam (ATIVAN) tablet 0.5 mg, 0.5 mg, Oral, Q6H PRN, 0.5 mg at 04/03/23 2216 **OR** LORazepam (ATIVAN) injection 0.5 mg, 0.5 mg, Intramuscular, Q6H PRN, Milinda Antis, PA-C, 0.5 mg at 03/29/23 1648   magnesium oxide (MAG-OX) tablet 400 mg, 400 mg, Oral, Daily, Setzer, Lynnell Jude, PA-C, 400 mg at 04/03/23 0741   mometasone-formoterol (DULERA) 200-5 MCG/ACT  inhaler 2 puff, 2 puff, Inhalation, BID, Setzer, Lynnell Jude, PA-C, 2 puff at 04/04/23 0617   Muscle Rub CREA, , Topical, PRN, Fanny Dance, MD   ondansetron [facility]) tablet 4 mg, 4 mg, Oral, Q6H PRN, 4 mg at 03/16/23 2154 **OR** ondansetron (ZOFRAN) injection 4 mg, 4 mg, Intravenous, Q6H PRN, Setzer, Lynnell Jude, PA-C, 4 mg at 04/03/23 1603   oxyCODONE (Oxy IR/ROXICODONE) immediate release tablet 2.5 mg, 2.5 mg, Oral, TID PRN, Elijah Birk C, DO, 2.5 mg at 04/03/23 2216   pantoprazole (PROTONIX) EC tablet 40 mg, 40 mg, Oral, BID, Setzer, Lynnell Jude, PA-C, 40 mg at 04/03/23 2216   traZODone (DESYREL) tablet 50 mg, 50 mg, Oral, QHS PRN, Ranelle Oyster, MD, 50 mg at 04/03/23 2217   traZODone (DESYREL) tablet 50 mg, 50 mg, Oral, QHS, Ranelle Oyster, MD, 50 mg at 04/03/23 2216  Allergies: Allergies  Allergen Reactions   Opana [Oxymorphone Hcl] Itching   Tramadol Itching

## 2023-04-04 NOTE — Progress Notes (Signed)
Physical Therapy Session Note  Patient Details  Name: [name] [name] MRN: [id number] Date of Birth: [birth year]  Today's Date: 04/04/2023 PT Individual Time: 5621-3086 PT Individual Time Calculation (min): 55 min   Short Term Goals: Week 3:  PT Short Term Goal 1 (Week 3): STG = LTG due to ELOS  Skilled Therapeutic Interventions/Progress Updates:      Pt received sitting in w/c, sleeping and wrapped in blankets. Pt agreeable to therapy session and reports no pain to start.   Session focused on functional gait training and improving activity tolerance.   Transported in w/c to day room rehab gym. Initiated gait training with R HHA. Pt needing minA to stand, primarily for initiation and then for stability. Ambulated 149ft + 12ft + 24ft with several brief standing rest breaks around the nurses station - pt holding onto hand rails and counter tops in the hallways for support, but this was encouraged to help with reducing frustration and improving participation. Verbal cueing of taking "bigger steps" which he responded well to but difficult to sustain. Also demonstrates more of a anterior weight shift at the trunk and landing on forefeet during initial contact of gait cycle.   Pt assisted onto the Nustep with minA ambulatory transfer. Pt participated at L7 resistance using BUE/BLE with intermittent participation.   Returned to his room and patient ended session seated in wheelchair with seat belt alarm on, door open, telesitter in room.  Pt without any negative behaviors during session and was calm/cooperative throughout. He did seem to be quite lethargic and would fall asleep while sitting during his rest breaks.  Pt reporting feeling hungry - offered saltine crackers during session.   Therapy Documentation Precautions:  Precautions Precautions: Fall Restrictions Weight Bearing Restrictions: No General:     Therapy/Group: Individual Therapy  Muna Demers P Kuper Rennels PT 04/04/2023, 7:59  AM

## 2023-04-04 NOTE — Progress Notes (Signed)
Occupational Therapy Session Note  Patient Details  Name: [name] [name] MRN: [id number] Date of Birth: 1950/09/08  Today's Date: 04/04/2023 OT Individual Time: 1000-1045 OT Individual Time Calculation (min): 45 min    Short Term Goals: Week 4:  OT Short Term Goal 1 (Week 4): To continue to consistently meet LTG.  Skilled Therapeutic Interventions/Progress Updates:   Pt asleep upon OT arrival wrapped up in blanket in w/c in room. Easily awakened with pt requesting toileting. Mod cues for impulsivity this session with multiple attempts for sit to stand prior to safety measures set up by OT. Once gait belt and wheel locks secured by OT, pt amb with HHA from R side to and from bathroom then to sink side for hand and face washing. Once seated back in chair, pt assisted for clean incontinence brief, pull on pants and slip on shoes donning with increased time, structure, min A and cues. Left pt with Telesitter, w/c belt alarm set, nurse call button and reinforced 4 P's to pt with nursing alerted of increased mild agitation.   Pain: denies any pain   Therapy Documentation Precautions:  Precautions Precautions: Fall Restrictions Weight Bearing Restrictions: No   Therapy/Group: Individual Therapy  Vicenta Dunning 04/04/2023, 7:38 AM

## 2023-04-04 NOTE — Progress Notes (Addendum)
[facility] Progress Note   Subjective:   Seen in room - Sleeping, rouses to voice.   Objective Vitals:   04/03/23 1930 04/03/23 2000 04/03/23 2018 04/04/23 1234  BP: (!) 137/98 (!) 152/80 (!) 148/78 107/81  Pulse: 69 69 68 88  Resp: 14 18 (!) 22 16  Temp:    98.2 F (36.8 C)  TempSrc:      SpO2:    100%  Weight:      Height:       Physical Exam General: Lying in bed, nad Heart: Regular rate Lungs: Bilat chest rise Abdomen: soft Extremities: trace BLE edema Dialysis Access: LUE AVG + bruit  Additional Objective Labs: Basic Metabolic Panel: Recent Labs  Lab 03/29/23 1528 03/31/23 1420 04/01/23 0816  NA 137 134* 134*  K 4.2 2.9* 4.0  CL 97* 94* 97*  CO2 24 29 29   GLUCOSE 107* 102* 105*  BUN 57* 12 37*  CREATININE 10.88* 2.13* 8.09*  CALCIUM 9.0 8.0* 8.7*  PHOS 2.6 <1.0* 2.8   Liver Function Tests: Recent Labs  Lab 03/29/23 1528 03/31/23 1420 04/01/23 0816  ALBUMIN 2.1* 2.4* 2.1*   CBC: Recent Labs  Lab 03/29/23 1529 03/31/23 1420  WBC 4.5 3.6*  HGB 8.6* 9.6*  HCT 30.2* 32.8*  MCV 97.1 98.2  PLT 152 155   Medications:   (feeding supplement) PROSource Plus  30 mL Oral BID BM   ARIPiprazole  5 mg Oral QHS   aspirin EC  81 mg Oral Daily   atorvastatin  80 mg Oral Daily   Chlorhexidine Gluconate Cloth  6 each Topical Q0600   darbepoetin (ARANESP) injection - DIALYSIS  150 mcg Subcutaneous Q Sun-1800   divalproex  125 mg Oral Q8H   feeding supplement  237 mL Oral BID WC   Gerhardt's butt cream  1 Application Topical QID   lidocaine  1 patch Transdermal Q2200   lidocaine  1 patch Transdermal Q24H   magnesium oxide  400 mg Oral Daily   mometasone-formoterol  2 puff Inhalation BID   pantoprazole  40 mg Oral BID   traZODone  50 mg Oral QHS    Dialysis Orders: Previously on CCPD thru [facility] - changed to HD - will need outpatient HD unit established prior to d/c. 4hr, 3K, EDW ~96, L AVF   Assessment/Plan: 1. Debility  - in CIR 2. Encephalopathy/agitation: Waxing/waning agitation, dementia sundowning. Ammonia fine and brain MRI with no acute findings. Disruptive during HD on 9/12, did better on 9/14 until last 5 minutes. 3. ESRD: Transitioned from PD to HD recently on TTS schedule. Using old AVF which is still functional, s/p f'gram 02/28/23. Next HD 9/19 4. Hypotension/volume: BP higher, mido 5mg  TID stopped 9/13 - follow. 5. Anemia: Hgb 9.6 - improving on Aranesp weekly. Last Tsat 38% with ferritin 2175.  No iron.  6. Metabolic bone disease: CorrCa ok, Phos low end on repeat - holding binders (renvela), no VDRA. 7.  Nutrition:  Alb very low, continue supps.  Renal diet w/fluid restrictions.  8.  Hx NSVT: On Coreg 10.  Recent culture negative PD cath-related peritonitis and associated fungemia + enterococcus bacteremia - underwent PD cath removal, TDC placement and removal, temp HD cath placement  and removal, and line holiday. Completed IV unasyn, IV micafungin and cipro on 8/15. TDC not replaced cause he had functional AVF.   11. Dispo: Will need to be able to calmly sit through HD for a full outpatient treatment without trying  to stand for his own safety. Will need sitter as outpatient - apparently his wife is willing. Needs placement to outpatient dialysis center. Renal navigator aware.   Tomasa Blase PA-C [facility] 04/04/2023,12:43 PM  Pt seen, examined and agree w A/P as above.  Vinson Moselle MD  [facility] 04/04/2023, 2:37 PM

## 2023-04-04 NOTE — Progress Notes (Signed)
SLP Cancellation Note  Patient Details Name: [name] [name] MRN: [id number] DOB: 1950/08/13   Cancelled treatment:        SLP attempted to facilitate skilled therapy session targeting cognitive retraining goals. Upon entry, patient asleep in bed with lunch tray untouched at bedside. SLP inquired re: patient's desire to wake up and eat lunch with patient at first indicating desire to do so. SLP prepared wheelchair for stand pivot transfer, however patient fell back asleep. Throughout next 15 minutes, SLP attempted to wake patient for transfer to chair, however patient never remained awake for more then 2-3 seconds at a time. When awake, patient would state "you wake up too early" or similar despite reorientation to time. Patient disoriented to time, place, and situation. Eventually, trails to coax patient upright and into chair for meal were ceased. Patient was left in lowered bed with call bell in reach and bed alarm set. SLP will continue to target goals per plan of care.                                                                                                [name], M.A., CCC-SLP   [name] 04/04/2023, 1:22 PM

## 2023-04-04 NOTE — Progress Notes (Signed)
PROGRESS NOTE   Subjective/Complaints: No events noted overnight regarding agitation.  Patient sitting in wheelchair in his room today.  Social work note indicates wife is amenable to sitting with patient in dialysis clinic if needed.  Psychiatry to see him today.  ROS: Limited due to cognitive/behavioral   Objective:   No results found. No results for input(s): "WBC", "HGB", "HCT", "PLT" in the last 72 hours.    No results for input(s): "NA", "K", "CL", "CO2", "GLUCOSE", "BUN", "CREATININE", "CALCIUM" in the last 72 hours.     Intake/Output Summary (Last 24 hours) at 04/04/2023 1252 Last data filed at 04/03/2023 2018 Gross per 24 hour  Intake 118 ml  Output 2000 ml  Net -1882 ml         Physical Exam: Vital Signs Blood pressure 107/81, pulse 88, temperature 98.2 F (36.8 C), resp. rate 16, height 5\' 4"  (1.626 m), weight 99.6 kg, SpO2 100%.  Gen: no distress, sitting in nurses station in his wheelchair HEENT: oral mucosa pink and moist, NCAT Cardio: RRR Chest: CTAB, normal effort, normal rate of breathing Abd: soft, non-distended, positive bowel sounds Ext: Trace bilateral lower extremity edema Skin: intact  Ext: no clubbing, cyanosis, or edema Psych: Confused, not agitated at this time Neurologic Exam:   Appears drowsy-unchanged, wakes to voice, poor awareness and insight.  Appears calm at the moment Oriented to self, rehabilitation center + Memory deficit - ongoing  Sensory exam: Intact to touch in all 4 extremities Motor exam: moves all 4 extremities without limitiations Coordination: Slight bilateral upper extremity tremor, intention, mild.    Musculoskeletal: No joint swelling noted No abnormal tone      Assessment/Plan: 1. Functional deficits which require 15 hr per week of interdisciplinary therapy in a comprehensive inpatient rehab setting. Physiatrist is providing close team supervision and  24 hour management of active medical problems listed below. Physiatrist and rehab team continue to assess barriers to discharge/monitor patient progress toward functional and medical goals  Care Tool:  Bathing    Body parts bathed by patient: Right arm, Left arm, Chest, Abdomen, Front perineal area, Right upper leg, Buttocks, Left upper leg, Right lower leg, Left lower leg, Face         Bathing assist Assist Level: Moderate Assistance - Patient 50 - 74%     Upper Body Dressing/Undressing Upper body dressing   What is the patient wearing?: Pull over shirt    Upper body assist Assist Level: Minimal Assistance - Patient > 75%    Lower Body Dressing/Undressing      Lower body dressing      What is the patient wearing?: Pants     Lower body assist Assist for lower body dressing: Moderate Assistance - Patient 50 - 74%     Toileting Toileting    Toileting assist Assist for toileting: Moderate Assistance - Patient 50 - 74%     Transfers Chair/bed transfer  Transfers assist     Chair/bed transfer assist level: Minimal Assistance - Patient > 75%     Locomotion Ambulation   Ambulation assist      Assist level: Minimal Assistance - Patient > 75% Assistive device: No Device Max  distance: 175'   Walk 10 feet activity   Assist     Assist level: Contact Guard/Touching assist Assistive device: No Device   Walk 50 feet activity   Assist Walk 50 feet with 2 turns activity did not occur: Safety/medical concerns  Assist level: Minimal Assistance - Patient > 75% Assistive device: No Device    Walk 150 feet activity   Assist Walk 150 feet activity did not occur: Safety/medical concerns  Assist level: Minimal Assistance - Patient > 75% Assistive device: No Device    Walk 10 feet on uneven surface  activity   Assist Walk 10 feet on uneven surfaces activity did not occur: Safety/medical concerns         Wheelchair     Assist Is the patient  using a wheelchair?: Yes Type of Wheelchair: Manual    Wheelchair assist level: Dependent - Patient 0%      Wheelchair 50 feet with 2 turns activity    Assist        Assist Level: Dependent - Patient 0%   Wheelchair 150 feet activity     Assist      Assist Level: Dependent - Patient 0%   Blood pressure 107/81, pulse 88, temperature 98.2 F (36.8 C), resp. rate 16, height 5\' 4"  (1.626 m), weight 99.6 kg, SpO2 100%.  Medical Problem List and Plan: 1. Functional deficits secondary to metabolic encephalopathy due to sepsis secondary to catheter peritonitis and hypoxemia              -patient may not shower             -ELOS/Goals: 16-18 days, PT/OT Sup, SLP min A -tentative discharge date 9-9           -Continue CIR therapies including PT, OT, and SLP   -Will review records from [facility] -- has worsened since approximately 8/22, possible 8/19, consistent with records from nursing at [facility] with current behavior.    - continue enclosure bed for safety  -Therapy QD schedule  2.  Antithrombotics: -DVT/anticoagulation:  Pharmaceutical: Heparin, 03/23/23 changed to Lovenox yesterday to help improved compliance             -antiplatelet therapy: aspirin and Plavix   3. Pain Management: Tylenol as needed             -Lidoderm patches to back  -Elavil 25 mg at bedtime for nerve pain and sleep 8/27  -8/29: Pain well controlled on Elavil; DC lyrica, PRN Tylenol/oxy 2.5-5 mg d/t CKD  8-30: Appears patient has been getting pain medications together overnight along with antipsychotics, will discuss with nursing  8-31: Patient having an difficulty endorsing needs and not utilizing PRNs, scheduled oxycodone 5 mg 3 times daily and has 2.5 mg prn 3 times daily available.   9-1: Not utilizing PRNs, minimal complaints of pain, will discontinue scheduled given lethargy today..  -muscle rub R shoulder  9/16 patient reports pain is under control overall   4. Mood/Behavior/Sleep:  LCSW to evaluate and provide emotional support             -continue Seroquel 50 mg TID             -clonazepam 0.5 mg TID prn             -Geodon 10 mg IM q 12 hours prn             -antipsychotic agents: n/a  -8-27: Agitated by soft restraints in bed, transition to  4 side rails, Posey alarm, and TeleSitter for now.  Veil bed on standby if needed, however feel we should avoid this if possible due to likelihood of agitating patient further   - 8/28: Placed in veil bed; wrist restraints for dialysis. Scheduled Geodon 20 mg twice daily; discontinue Seroquel and clonazepam due to no perceivable benefit. Add as needed Zyprexa 5 mg twice daily p.o. or IM due to recent cardiac arrhythmias and concern for QTc prolongation. As needed ramelteon for sleep. Stop gabapentin due to possible side effect of agitation  - 8/28: No improvement except temporarily with Geodon; add depakote 125 mg q8h for ongoing aggitation, mood stabilization. Increase elavil to 50 mg at bedtime.  Workup ongoing.    - 8/29: EEG negative, labs normal. MRI pending. Sleeping well and calm since starting depakote and increase in elavil. Will monitor tonight and wean Geodon in AM if appropriate.   8-30: MRI showing chronic microvascular changes, otherwise normal.  Increasingly calm and appropriate during the day, with ongoing agitation at nighttime.  Will increase nighttime Geodon to 40 mg; will discuss sleep log with nursing, as it has not been performed.  8-31 no sleep overnight per sleep log, again getting agitated around 9 PM and 5 AM.  Not responsive to as needed Zyprexa, responsive to Ativan.  Attempted to wean off scheduled a.m. Geodon today, but patient became extremely agitated and unable to redirect, calm down with readministration of medication.  Resume Geodon 20 mg every morning/40 mg every afternoon, increased Elavil to 75 mg nightly, adding trazodone 50 mg nightly as needed.  P.o./IV Ativan 0.5 mg every 6 hours for agitation.  9-1:  slept well overnight with trazodone and increased Elavil, no PRNs used.  Unfortunately, oversedated today.  Discontinue scheduled pain medications as above, start wean of Elavil to 50 mg x2 days -> 25 mg x2 days -> 10 mg x2 days, off due to no apparent benefit and no complaints of pain.  Reduce Geodon from 20 mg every morning/40 mg every evening to 20 mg twice daily.  Continue trazodone as needed for sleep, as this seems to be the difference for a restful night yesterday.  If he remains overly sedated, would dose reduce and DC Depakote next.  Keep Ativan as needed d/t dialysis not having other options available.  9/2-3--non agitated but still sleepy   -dc'ed depakote as I doubt it's doing much at this dose anyway   -continue geodon at 20mg  bid   -scheduled trazodone at HS with back up dose if needed    -sleep was intermittent last night   -requires enclosure bed for safety  9/4 continue enclosure bed for now, consider trying DC of this device early next week  -9/5 appears to be doing better overall this morning, continues to have intermittent agitation at times.  Continue enclosure bed  -9/6 agitation appears to be improving, patient is skeptical about medications and has been declining some of these.  Will discontinue multivitamin, RisaQuad to try to decrease pill burden  -9/7 continues to have intermittent episodes of agitation however overall improving, appears to be taking most of his medications  9/9 agitation and mental status appears to be improving -discussing discontinuation of enclosure bed with team 9/10 Geodon dose increased to 40 mg at night, continue 20 mg in the morning 9/11 No acute events noted, will check with team regarding DC enclosure bed, nursing reports he did ok last night but had dialysis 9/12 continues to have sundowning/waxing waning confusion and  agitation, continue enclosure bed for now, will caution with Geodon will continue current dose but hold off on any escalation as  this can contribute to bradycardia 9/16 no longer using enclosure bed, continue Geodon 20 mg twice daily for now due to intermittent agitation 9/17 patient with agitation and confusion at night, has sedation during the day.  Discussed with psychiatry Dr. Gasper Sells.  Will discontinue Geodon and try Abilify tonight.  Dr. Gasper Sells also suggested considering donepezil to shorten hallucinations of delirium.  Psychiatry consult placed, will see tomorrow.  Appreciate assistance 9/18 no behavioral related notes overnight, psychiatry plans to see him today.  Appreciate assistance 5. Neuropsych/cognition: This patient is not capable of making decisions on his own behalf   -recommend neuropsych consult   - 8/27: Ongoing aggitation; Consult hospitalist for further workup on potential contributors to encephalopathy. Pending UA, LA 2.1-> 1.4; otherwise negative so far -Delirium precautions, sleep log, Veiled bed for safety and to minimize restraints -Wife has expressed that she is not interested in palliative care at this time; hopefully with above measures patient can get dialysis in the next 48 hours to limit worsening uremic encephalopathy and medical decline    - 8/29: Aggitation much improved, able to get some dialysis last night and today. See above for medication changes. Calling wife tonight to discuss.   8-30: Still with moderate cognitive deficits, however appears once patient is less agitated he is better able to concentrate and has better orientation.  MRI negative for acute findings, monitor.  9/3 agitation better. More sedated than anything at this point. Confused as well.    -depakote stopped   -continue other meds as rx'ed  6. Skin/Wound Care: Routine skin care checks             -sacral skin off-loading/protection   7. Fluids/Electrolytes/Nutrition: Strict Is and Os and follow-up chemistries per nephrology             -renal diet/ 1200 cc FR  - Renvela 800 mg 3 times daily for  hyperphosphatemia added per nephrology 8-27   8: Hypotension: monitor TID and prn             -continue midodrine as needed during dialysis  Normotensive, monitor  9-1: Consistently 90s over 60s, has been stable but given findings of microvascular changes on MRI, may be too low for patient to perfuse properly.  Will add midodrine 5 mg tid and monitor.  DC Coreg as heart rate has been low to normal; may resume at low-dose once blood pressure improved.   9/2 continue with ccurrent regimen. Bp's hr remain low   -elavil wean should help  9/9-10 BP stable, continue midodrine and monitor  9/13 DC midodrine today as BP has been higher, can restart if needed      04/04/2023   12:34 PM 04/03/2023    8:18 PM 04/03/2023    8:00 PM  Vitals with BMI  Systolic 107 148 161  Diastolic 81 78 80  Pulse 88 68 69      9: Hyperlipidemia: continue statin   10: CAD s/p CABG 2023: on DAPT and statin.  - troponin elevated but stable, monitor 8/27   11: DM-2: A1c = 6.3% (at home on Lantus 22 units at bedtime, Tradjenta 5 mg daily)        -monitor PO intake             -continue SSI (0-6 units) does not appear to be requiring coverage   - 9/6  DC SSI, change CBGs to daily  9/8 discontinue CBGs  9/16 glucose still controlled on BMP yesterday   No results for input(s): "GLUCAP" in the last 72 hours.    12: ESRD: HD on Tues, Thurs, Sat -nephrology made aware   - Unable to tolerate 8/27 d/t aggitation, stopped early 8/28;  - attempting again 8/29, stopped early again -Per nephrology, redirectable with gum -Have to switch restraints during dialysis sessions from veil bed to regular bed, then resume veil bed order once at rehab -9/9 tolerating HD better 9/18 patient's wife indicates she would be able to stay with him during dialysis after discharge   13: COPD/OSA; chronic home 2L O2 (Symbicort, Ventolin, prednisone 40 mg daily at home)             -continue symbicort  -Added home oxygen back 8-27   14:  GERD: continue Protonix   15: Non-sustained VT/pAF/bradycardia: per cardiology, Dr. Algie Coffer: -continue carvedilol 6.25 mg BID; consider increasing to 12.5 mg BID before starting low dose amiodarone  -consider Eliquis and discontinue asa and Plavix in one week if no active GI bleeding and stable hemoglobin from 8/24 -hemoglobin has remained stable but very low around 7, no recurrent A-fib, monitor for now given agitation/fall 8-31 and consider initiating Eliquis when safe -Continue mag-ox to avoid hypomagnesemia -8-27: Single instance of bradycardia this morning 40s, may have been missed read, monitor through today and do not further adjust beta-blocker medications  -9-3 hr controlled after holding coreg -9/4-5 HR stable, continue to monitor -9/9 consider Eliquis this week if agitation continues to improve -9/10 will hold off on Eliquis due to sundowning at night, will see how he does with increase Geodon -9/11 EKG for bradycardia episode today, 2nd degree A-V block mobitz type 1.  Continue to hold beta blocker,9/11 will restart eliquis at 2.5 mg BID dose. Will consult cardiology regarding need for ASA/plavix and coreg.  Dr. Elease Hashimoto does not appear convinced that he has A-fib 9/12 appreciate cardiology consult yesterday, DAPT transition to daily aspirin 81 only and Eliquis was discontinued, caution with Geodon as this can contribute to bradycardia 9/13 will decrease Geodon nighttime dose to 20 due to episodes of bradycardia 9/17 will discontinue Geodon due to variable cardiac effects 9/18 heart rate stable this morning continue to monitor     04/04/2023   12:34 PM 04/03/2023    8:18 PM 04/03/2023    8:00 PM  Vitals with BMI  Systolic 107 148 161  Diastolic 81 78 80  Pulse 88 68 69       16: HFrEF: Daily weights. Monitor for signs of overload  - 9/7 continue daily weights, fluid level management per hemodialysis team Weight reviewed and stable  Filed Weights   04/01/23 0353 04/02/23 0459  04/03/23 1530  Weight: 98.1 kg 97.9 kg 99.6 kg      17: Liver nodularity on CT scan: LFTs reported to be normal. Avoid hepatotoxic medications    -Abdominal ultrasound earlier this admission with minimal ascites; ammonia within normal limits  -8/31: Did discuss with nephrology whether p.o. lactulose would be beneficial despite normal ammonia, after reviewing records unsure that this would be beneficial    18: Epistaxis - resolved             -previously improved with flonase and saline spray   19: Morbid Obesity. Dietary counseling Body mass index is 39.01 kg/m.  20. Bradycardic: continue to monitor HR TID  -Discontinue Geodon  21. HTN: resolved after HD,  continue to monitor TID  9/18 BP overall stable, continue to monitor trend      LOS: 23 days A FACE TO FACE EVALUATION WAS PERFORMED  Fanny Dance 04/04/2023, 12:52 PM

## 2023-04-04 NOTE — Plan of Care (Signed)
  Problem: Consults Goal: RH GENERAL PATIENT EDUCATION Description: See Patient Education module for education specifics. Outcome: Progressing   Problem: RH BOWEL ELIMINATION Goal: RH STG MANAGE BOWEL WITH ASSISTANCE Description: STG Manage Bowel with mod I Assistance. Outcome: Progressing Goal: RH STG MANAGE BOWEL W/MEDICATION W/ASSISTANCE Description: STG Manage Bowel with Medication with mod I Assistance. Outcome: Progressing   Problem: RH SAFETY Goal: RH STG ADHERE TO SAFETY PRECAUTIONS W/ASSISTANCE/DEVICE Description: STG Adhere to Safety Precautions With cues Assistance/Device. Outcome: Progressing   Problem: RH PAIN MANAGEMENT Goal: RH STG PAIN MANAGED AT OR BELOW PT'S PAIN GOAL Description: < 4 with prns Outcome: Progressing   Problem: RH KNOWLEDGE DEFICIT GENERAL Goal: RH STG INCREASE KNOWLEDGE OF SELF CARE AFTER HOSPITALIZATION Description: Patient and wife will be able to manage care at discharge with educational resources independently Outcome: Progressing   Problem: Safety: Goal: Non-violent Restraint(s) Outcome: Progressing   Problem: Education: Goal: Ability to describe self-care measures that may prevent or decrease complications (Diabetes Survival Skills Education) will improve Outcome: Progressing Goal: Individualized Educational Video(s) Outcome: Progressing   Problem: Coping: Goal: Ability to adjust to condition or change in health will improve Outcome: Progressing   Problem: Fluid Volume: Goal: Ability to maintain a balanced intake and output will improve Outcome: Progressing   Problem: Health Behavior/Discharge Planning: Goal: Ability to identify and utilize available resources and services will improve Outcome: Progressing Goal: Ability to manage health-related needs will improve Outcome: Progressing   Problem: Metabolic: Goal: Ability to maintain appropriate glucose levels will improve Outcome: Progressing   Problem: Nutritional: Goal:  Maintenance of adequate nutrition will improve Outcome: Progressing Goal: Progress toward achieving an optimal weight will improve Outcome: Progressing   Problem: Skin Integrity: Goal: Risk for impaired skin integrity will decrease Outcome: Progressing   Problem: Tissue Perfusion: Goal: Adequacy of tissue perfusion will improve Outcome: Progressing

## 2023-04-05 LAB — VITAMIN D 25 HYDROXY (VIT D DEFICIENCY, FRACTURES): Vit D, 25-Hydroxy: 72.1 ng/mL (ref 30–100)

## 2023-04-05 MED ORDER — DIVALPROEX SODIUM 125 MG PO CSDR
125.0000 mg | DELAYED_RELEASE_CAPSULE | Freq: Two times a day (BID) | ORAL | Status: DC
  Administered 2023-04-05 – 2023-04-08 (×6): 125 mg via ORAL
  Filled 2023-04-05 (×8): qty 1

## 2023-04-05 NOTE — Progress Notes (Signed)
Physical Therapy Session Note  Patient Details  Name: [name] [name] MRN: [id number] Date of Birth: 06/07/1951  Today's Date: 04/05/2023 PT Individual Time: 0810-0848 PT Individual Time Calculation (min): 38 min   Short Term Goals: Week 3:  PT Short Term Goal 1 (Week 3): STG = LTG due to ELOS  Skilled Therapeutic Interventions/Progress Updates:    Pt presents in room in bed, asleep but awakens slowly with verbal/tactile cues. Pt does not report pain. Session focused on participation with self care tasks as well as sequencing verbal commands due to pt requiring max multimodal cues for all mobility and self care tasks due to pt lethargy and increased tremors. RN notified during session who reports pt refused or unable to take morning medications.  Pt completes bed mobility with max verbal cues for attendance to task and sequencing. Therapist provides max assistance for all lower/upper body dressing with max verbal cues for sitting balance and attention to task. Pt completes ambulatory transfer ~5' to [facility] with R HHA min assist and mod verbal cues for turning to position prior to sitting.  Pt requesting to eat breakfast however wishing to complete washing face and oral hygiene first. Pt positioned in front of sink and requires max verbal cues and occasional hand over hand assist for sequencing turning on sink, wetting washrag, bringing washrag to face as well as managing tooth paste and tooth brush. Pt transported to nurses station due to pt impulsively attempting to stand throughout session. Pt set up for breakfast however pt falling asleep. Pt remains seated with chair alarm donned and supervision from nursing staff at end of session. Session terminated early due to pt lethargy and inability to continue participating with therapeutic tasks.  Therapy Documentation Precautions:  Precautions Precautions: Fall Restrictions Weight Bearing Restrictions: No General: PT Amount of Missed Time (min): 37  Minutes PT Missed Treatment Reason: Patient fatigue    Therapy/Group: Individual Therapy  Edwin Cap PT, DPT 04/05/2023, 12:05 PM

## 2023-04-05 NOTE — Progress Notes (Signed)
Nurse witnessed spouse arguing with patient and educated Spouse that arguing with patient increases patient's agitation. Cletis Media, RN

## 2023-04-05 NOTE — Progress Notes (Signed)
Patient ID: [name] [name], male   DOB: Apr 19, 1951, 72 y.o.   MRN: [id number]  SW met with pt wife to discuss changes with medications per psychiatrist that called her yesterday about new medications. She has questions about medications, and is hopeful the change will allow him ot be more awake. She would like SW to still pursue SNF. States if unable to locate a SNF that will accept, she is willing to take him home and provide care with [facility] therapies. She understands if an aide is needed, she will need to private pay. She does not want her husband in a long term care home.   Cecile Sheerer, MSW, LCSWA Office: [phone] Cell: [phone] Fax: [phone]

## 2023-04-05 NOTE — Progress Notes (Signed)
Speech Language Pathology Daily Session Note  Patient Details  Name: WISE MOTHERWAY MRN: [id number] Date of Birth: 1950/11/02  Today's Date: 04/05/2023 SLP Individual Time: 3086-5784 SLP Individual Time Calculation (min): 25 min  Short Term Goals: Week 4: SLP Short Term Goal 1 (Week 4): Patient will demonstrate intellectual awareness of deficits via verbally stating 2 cognitive and 2 physical impairments given modA SLP Short Term Goal 2 (Week 4): Patient will demonstrate use of recommended safe swallow strategies during mealtimes with modA. SLP Short Term Goal 3 (Week 4): Patient will utilize external aids to orient to time and situation given modA SLP Short Term Goal 4 (Week 4): Patient will participate in therapy sessions in 80% of opportunities given modA multimodal cues.  Skilled Therapeutic Interventions: Skilled treatment session focused on cognitive goals. Upon arrival, patient was asleep while upright in the wheelchair at the nurse's station. Patient remained lethargic throughout session and required Max A multimodal cues for focused attention for ~15 minute seconds. Patient required total A to initiate or attend to any functional task and was disoriented X 4. Max A multimodal cues were needed for use of external aids. Patient left upright in wheelchair at nurse's station with alarm on. Continue with current plan of care.      Pain No reports of pain  Therapy/Group: Individual Therapy  Jahmez Bily 04/05/2023, 12:03 PM

## 2023-04-05 NOTE — Plan of Care (Signed)
Problem: Consults Goal: RH GENERAL PATIENT EDUCATION Description: See Patient Education module for education specifics. 04/05/2023 1150 by Cletis Media, RN Outcome: Progressing 04/05/2023 1149 by Cletis Media, RN Outcome: Progressing   Problem: RH BOWEL ELIMINATION Goal: RH STG MANAGE BOWEL WITH ASSISTANCE Description: STG Manage Bowel with mod I Assistance. 04/05/2023 1150 by Cletis Media, RN Outcome: Not Progressing 04/05/2023 1149 by Cletis Media, RN Outcome: Progressing Goal: RH STG MANAGE BOWEL W/MEDICATION W/ASSISTANCE Description: STG Manage Bowel with Medication with mod I Assistance. 04/05/2023 1150 by Cletis Media, RN Outcome: Not Progressing 04/05/2023 1149 by Cletis Media, RN Outcome: Progressing   Problem: RH SAFETY Goal: RH STG ADHERE TO SAFETY PRECAUTIONS W/ASSISTANCE/DEVICE Description: STG Adhere to Safety Precautions With cues Assistance/Device. 04/05/2023 1150 by Cletis Media, RN Outcome: Not Progressing 04/05/2023 1149 by Cletis Media, RN Outcome: Progressing   Problem: RH PAIN MANAGEMENT Goal: RH STG PAIN MANAGED AT OR BELOW PT'S PAIN GOAL Description: < 4 with prns 04/05/2023 1150 by Cletis Media, RN Outcome: Progressing 04/05/2023 1149 by Cletis Media, RN Outcome: Progressing   Problem: RH KNOWLEDGE DEFICIT GENERAL Goal: RH STG INCREASE KNOWLEDGE OF SELF CARE AFTER HOSPITALIZATION Description: Patient and wife will be able to manage care at discharge with educational resources independently 04/05/2023 1150 by Cletis Media, RN Outcome: Progressing 04/05/2023 1149 by Cletis Media, RN Outcome: Progressing   Problem: Safety: Goal: Non-violent Restraint(s) 04/05/2023 1150 by Cletis Media, RN Outcome: Progressing 04/05/2023 1149 by Cletis Media, RN Outcome: Progressing   Problem: Education: Goal: Ability to describe self-care measures that may prevent or decrease complications (Diabetes Survival Skills Education) will  improve 04/05/2023 1150 by Cletis Media, RN Outcome: Not Progressing 04/05/2023 1149 by Cletis Media, RN Outcome: Progressing Goal: Individualized Educational Video(s) 04/05/2023 1150 by Cletis Media, RN Outcome: Progressing 04/05/2023 1149 by Cletis Media, RN Outcome: Progressing   Problem: Coping: Goal: Ability to adjust to condition or change in health will improve 04/05/2023 1150 by Cletis Media, RN Outcome: Progressing 04/05/2023 1149 by Cletis Media, RN Outcome: Progressing   Problem: Fluid Volume: Goal: Ability to maintain a balanced intake and output will improve 04/05/2023 1150 by Cletis Media, RN Outcome: Progressing 04/05/2023 1149 by Cletis Media, RN Outcome: Progressing   Problem: Health Behavior/Discharge Planning: Goal: Ability to identify and utilize available resources and services will improve 04/05/2023 1150 by Cletis Media, RN Outcome: Progressing 04/05/2023 1149 by Cletis Media, RN Outcome: Progressing Goal: Ability to manage health-related needs will improve 04/05/2023 1150 by Cletis Media, RN Outcome: Progressing 04/05/2023 1149 by Cletis Media, RN Outcome: Progressing   Problem: Metabolic: Goal: Ability to maintain appropriate glucose levels will improve 04/05/2023 1150 by Cletis Media, RN Outcome: Progressing 04/05/2023 1149 by Cletis Media, RN Outcome: Progressing   Problem: Nutritional: Goal: Maintenance of adequate nutrition will improve 04/05/2023 1150 by Cletis Media, RN Outcome: Progressing 04/05/2023 1149 by Cletis Media, RN Outcome: Progressing Goal: Progress toward achieving an optimal weight will improve 04/05/2023 1150 by Cletis Media, RN Outcome: Progressing 04/05/2023 1149 by Cletis Media, RN Outcome: Progressing   Problem: Skin Integrity: Goal: Risk for impaired skin integrity will decrease 04/05/2023 1150 by Cletis Media, RN Outcome: Progressing 04/05/2023 1149 by Cletis Media, RN Outcome:  Progressing   Problem: Tissue Perfusion: Goal: Adequacy of tissue perfusion will improve 04/05/2023 1150 by Cletis Media, RN Outcome: Progressing  04/05/2023 1149 by Cletis Media, RN Outcome: Progressing   Problem: Education: Goal: Knowledge of General Education information will improve Description: Including pain rating scale, medication(s)/side effects and non-pharmacologic comfort measures 04/05/2023 1150 by Cletis Media, RN Outcome: Progressing 04/05/2023 1149 by Cletis Media, RN Outcome: Progressing   Problem: Health Behavior/Discharge Planning: Goal: Ability to manage health-related needs will improve 04/05/2023 1150 by Cletis Media, RN Outcome: Progressing 04/05/2023 1149 by Cletis Media, RN Outcome: Progressing   Problem: Clinical Measurements: Goal: Ability to maintain clinical measurements within normal limits will improve 04/05/2023 1150 by Cletis Media, RN Outcome: Progressing 04/05/2023 1149 by Cletis Media, RN Outcome: Progressing Goal: Will remain free from infection 04/05/2023 1150 by Cletis Media, RN Outcome: Progressing 04/05/2023 1149 by Cletis Media, RN Outcome: Progressing Goal: Diagnostic test results will improve 04/05/2023 1150 by Cletis Media, RN Outcome: Progressing 04/05/2023 1149 by Cletis Media, RN Outcome: Progressing Goal: Respiratory complications will improve 04/05/2023 1150 by Cletis Media, RN Outcome: Progressing 04/05/2023 1149 by Cletis Media, RN Outcome: Progressing Goal: Cardiovascular complication will be avoided 04/05/2023 1150 by Cletis Media, RN Outcome: Progressing 04/05/2023 1149 by Cletis Media, RN Outcome: Progressing   Problem: Activity: Goal: Risk for activity intolerance will decrease 04/05/2023 1150 by Cletis Media, RN Outcome: Progressing 04/05/2023 1149 by Cletis Media, RN Outcome: Progressing   Problem: Nutrition: Goal: Adequate nutrition will be maintained 04/05/2023 1150 by Cletis Media, RN Outcome: Progressing 04/05/2023 1149 by Cletis Media, RN Outcome: Progressing   Problem: Coping: Goal: Level of anxiety will decrease 04/05/2023 1150 by Cletis Media, RN Outcome: Progressing 04/05/2023 1149 by Cletis Media, RN Outcome: Progressing   Problem: Elimination: Goal: Will not experience complications related to bowel motility 04/05/2023 1150 by Cletis Media, RN Outcome: Progressing 04/05/2023 1149 by Cletis Media, RN Outcome: Progressing Goal: Will not experience complications related to urinary retention 04/05/2023 1150 by Cletis Media, RN Outcome: Progressing 04/05/2023 1149 by Cletis Media, RN Outcome: Progressing   Problem: Pain Managment: Goal: General experience of comfort will improve 04/05/2023 1150 by Cletis Media, RN Outcome: Progressing 04/05/2023 1149 by Cletis Media, RN Outcome: Progressing   Problem: Safety: Goal: Ability to remain free from injury will improve 04/05/2023 1150 by Cletis Media, RN Outcome: Progressing 04/05/2023 1149 by Cletis Media, RN Outcome: Progressing   Problem: Skin Integrity: Goal: Risk for impaired skin integrity will decrease 04/05/2023 1150 by Cletis Media, RN Outcome: Progressing 04/05/2023 1149 by Cletis Media, RN Outcome: Progressing   Problem: Education: Goal: Will demonstrate proper wound care and an understanding of methods to prevent future damage 04/05/2023 1150 by Cletis Media, RN Outcome: Progressing 04/05/2023 1149 by Cletis Media, RN Outcome: Progressing Goal: Knowledge of disease or condition will improve 04/05/2023 1150 by Cletis Media, RN Outcome: Progressing 04/05/2023 1149 by Cletis Media, RN Outcome: Progressing Goal: Knowledge of the prescribed therapeutic regimen will improve 04/05/2023 1150 by Cletis Media, RN Outcome: Progressing 04/05/2023 1149 by Cletis Media, RN Outcome: Progressing Goal: Individualized Educational Video(s) 04/05/2023 1150 by Cletis Media, RN Outcome: Progressing 04/05/2023 1149 by Cletis Media, RN Outcome: Progressing   Problem: Activity: Goal: Risk for activity intolerance will decrease 04/05/2023 1150 by Cletis Media, RN Outcome: Progressing 04/05/2023 1149 by Cletis Media, RN Outcome: Progressing   Problem: Cardiac: Goal: Will achieve and/or maintain hemodynamic stability  04/05/2023 1150 by Cletis Media, RN Outcome: Progressing 04/05/2023 1149 by Cletis Media, RN Outcome: Progressing   Problem: Clinical Measurements: Goal: Postoperative complications will be avoided or minimized 04/05/2023 1150 by Cletis Media, RN Outcome: Progressing 04/05/2023 1149 by Cletis Media, RN Outcome: Progressing   Problem: Respiratory: Goal: Respiratory status will improve 04/05/2023 1150 by Cletis Media, RN Outcome: Progressing 04/05/2023 1149 by Cletis Media, RN Outcome: Progressing   Problem: Skin Integrity: Goal: Wound healing without signs and symptoms of infection 04/05/2023 1150 by Cletis Media, RN Outcome: Progressing 04/05/2023 1149 by Cletis Media, RN Outcome: Progressing Goal: Risk for impaired skin integrity will decrease 04/05/2023 1150 by Cletis Media, RN Outcome: Progressing 04/05/2023 1149 by Cletis Media, RN Outcome: Progressing   Problem: Urinary Elimination: Goal: Ability to achieve and maintain adequate renal perfusion and functioning will improve 04/05/2023 1150 by Cletis Media, RN Outcome: Progressing 04/05/2023 1149 by Cletis Media, RN Outcome: Progressing   Problem: Education: Goal: Understanding of cardiac disease, CV risk reduction, and recovery process will improve 04/05/2023 1150 by Cletis Media, RN Outcome: Progressing 04/05/2023 1149 by Cletis Media, RN Outcome: Progressing Goal: Individualized Educational Video(s) 04/05/2023 1150 by Cletis Media, RN Outcome: Progressing 04/05/2023 1149 by Cletis Media, RN Outcome: Progressing   Problem: Activity: Goal:  Ability to tolerate increased activity will improve 04/05/2023 1150 by Cletis Media, RN Outcome: Progressing 04/05/2023 1149 by Cletis Media, RN Outcome: Progressing   Problem: Cardiac: Goal: Ability to achieve and maintain adequate cardiovascular perfusion will improve 04/05/2023 1150 by Cletis Media, RN Outcome: Progressing 04/05/2023 1149 by Cletis Media, RN Outcome: Progressing   Problem: Health Behavior/Discharge Planning: Goal: Ability to safely manage health-related needs after discharge will improve 04/05/2023 1150 by Cletis Media, RN Outcome: Progressing 04/05/2023 1149 by Cletis Media, RN Outcome: Progressing   Problem: Education: Goal: Understanding of CV disease, CV risk reduction, and recovery process will improve 04/05/2023 1150 by Cletis Media, RN Outcome: Progressing 04/05/2023 1149 by Cletis Media, RN Outcome: Progressing Goal: Individualized Educational Video(s) 04/05/2023 1150 by Cletis Media, RN Outcome: Progressing 04/05/2023 1149 by Cletis Media, RN Outcome: Progressing   Problem: Activity: Goal: Ability to return to baseline activity level will improve 04/05/2023 1150 by Cletis Media, RN Outcome: Progressing 04/05/2023 1149 by Cletis Media, RN Outcome: Progressing   Problem: Cardiovascular: Goal: Ability to achieve and maintain adequate cardiovascular perfusion will improve 04/05/2023 1150 by Cletis Media, RN Outcome: Progressing 04/05/2023 1149 by Cletis Media, RN Outcome: Progressing Goal: Vascular access site(s) Level 0-1 will be maintained 04/05/2023 1150 by Cletis Media, RN Outcome: Progressing 04/05/2023 1149 by Cletis Media, RN Outcome: Progressing   Problem: Health Behavior/Discharge Planning: Goal: Ability to safely manage health-related needs after discharge will improve 04/05/2023 1150 by Cletis Media, RN Outcome: Not Progressing 04/05/2023 1149 by Cletis Media, RN Outcome: Progressing   Problem:  Education: Goal: Knowledge of cardiac device and self-care will improve 04/05/2023 1150 by Cletis Media, RN Outcome: Not Progressing 04/05/2023 1149 by Cletis Media, RN Outcome: Progressing Goal: Ability to safely manage health related needs after discharge will improve 04/05/2023 1150 by Cletis Media, RN Outcome: Not Progressing 04/05/2023 1149 by Cletis Media, RN Outcome: Progressing Goal: Individualized Educational Video(s) 04/05/2023 1150 by Cletis Media, RN Outcome: Progressing 04/05/2023 1149 by Cletis Media, RN Outcome: Progressing   Problem:  Cardiac: Goal: Ability to achieve and maintain adequate cardiopulmonary perfusion will improve 04/05/2023 1150 by Cletis Media, RN Outcome: Progressing 04/05/2023 1149 by Cletis Media, RN Outcome: Progressing

## 2023-04-05 NOTE — Progress Notes (Signed)
[facility] Progress Note   Subjective:   Seen in room - sleeping in wheelchair. Wife visiting. For dialysis today   Objective Vitals:   04/04/23 1234 04/04/23 2126 04/05/23 0500 04/05/23 0518  BP: 107/81 (!) 148/82  (!) 167/103  Pulse: 88 87  87  Resp: 16 18  20   Temp: 98.2 F (36.8 C) 98.3 F (36.8 C)    TempSrc:  Oral    SpO2: 100% 92%  96%  Weight:   99.4 kg   Height:       Physical Exam General: Lying in bed, nad Heart: Regular rate Lungs: Bilat chest rise Abdomen: soft Extremities: trace BLE edema Dialysis Access: LUE AVG + bruit  Additional Objective Labs: Basic Metabolic Panel: Recent Labs  Lab 03/29/23 1528 03/31/23 1420 04/01/23 0816  NA 137 134* 134*  K 4.2 2.9* 4.0  CL 97* 94* 97*  CO2 24 29 29   GLUCOSE 107* 102* 105*  BUN 57* 12 37*  CREATININE 10.88* 2.13* 8.09*  CALCIUM 9.0 8.0* 8.7*  PHOS 2.6 <1.0* 2.8   Liver Function Tests: Recent Labs  Lab 03/29/23 1528 03/31/23 1420 04/01/23 0816  ALBUMIN 2.1* 2.4* 2.1*   CBC: Recent Labs  Lab 03/29/23 1529 03/31/23 1420  WBC 4.5 3.6*  HGB 8.6* 9.6*  HCT 30.2* 32.8*  MCV 97.1 98.2  PLT 152 155   Medications:   (feeding supplement) PROSource Plus  30 mL Oral BID BM   ARIPiprazole  5 mg Oral QHS   aspirin EC  81 mg Oral Daily   atorvastatin  80 mg Oral Daily   Chlorhexidine Gluconate Cloth  6 each Topical Q0600   darbepoetin (ARANESP) injection - DIALYSIS  150 mcg Subcutaneous Q Sun-1800   divalproex  125 mg Oral Q8H   feeding supplement  237 mL Oral BID WC   Gerhardt's butt cream  1 Application Topical QID   lidocaine  1 patch Transdermal Q2200   lidocaine  1 patch Transdermal Q24H   magnesium oxide  400 mg Oral Daily   mometasone-formoterol  2 puff Inhalation BID   pantoprazole  40 mg Oral BID   traZODone  50 mg Oral QHS    Dialysis Orders: Previously on [facility]CPD thru [facility] - changed to HD - will need outpatient HD unit established prior to d/c. 4hr, 3K,  EDW ~96, L AVF   Assessment/Plan: 1. Debility - in CIR 2. Encephalopathy/agitation: Waxing/waning agitation, dementia sundowning. Ammonia fine and brain MRI with no acute findings. Has been disruptive during HD, better this week. Needs sitter.  3. ESRD: Transitioned from PD to HD recently on TTS schedule. Using old AVF which is still functional, s/p f'gram 02/28/23. Next HD 9/19 4. Hypotension/volume: BP higher, mido 5mg  TID stopped 9/13 - follow. 5. Anemia: Hgb 9.6 -  on Aranesp weekly. Last Tsat 38% with ferritin 2175.  No iron.  6. Metabolic bone disease: CorrCa ok, Phos low end on repeat - holding binders (renvela), no VDRA. 7.  Nutrition:  Alb very low, continue supps.  Renal diet w/fluid restrictions.  8.  Hx NSVT: On Coreg 10.  Recent culture negative PD cath-related peritonitis and associated fungemia + enterococcus bacteremia - underwent PD cath removal, TDC placement and removal, temp HD cath placement  and removal, and line holiday. Completed IV unasyn, IV micafungin and cipro on 8/15. TDC not replaced cause he had functional AVF.   11. Dispo: For SNF placement. Regarding outpatient dialysis placement- per renal navigator he will need  new referral since he has been gone from his prior clinic so long. Will need a sitter and his wife is willing. Once SNF placement is determined she can refer for outpatient clinic.   Tomasa Blase PA-C [facility] 04/05/2023,1:14 PM

## 2023-04-05 NOTE — Plan of Care (Signed)
  Problem: Consults Goal: RH GENERAL PATIENT EDUCATION Description: See Patient Education module for education specifics. Outcome: Progressing   Problem: RH BOWEL ELIMINATION Goal: RH STG MANAGE BOWEL WITH ASSISTANCE Description: STG Manage Bowel with mod I Assistance. Outcome: Progressing Goal: RH STG MANAGE BOWEL W/MEDICATION W/ASSISTANCE Description: STG Manage Bowel with Medication with mod I Assistance. Outcome: Progressing   Problem: RH SAFETY Goal: RH STG ADHERE TO SAFETY PRECAUTIONS W/ASSISTANCE/DEVICE Description: STG Adhere to Safety Precautions With cues Assistance/Device. Outcome: Progressing   Problem: RH PAIN MANAGEMENT Goal: RH STG PAIN MANAGED AT OR BELOW PT'S PAIN GOAL Description: < 4 with prns Outcome: Progressing   Problem: RH KNOWLEDGE DEFICIT GENERAL Goal: RH STG INCREASE KNOWLEDGE OF SELF CARE AFTER HOSPITALIZATION Description: Patient and wife will be able to manage care at discharge with educational resources independently Outcome: Progressing   Problem: Safety: Goal: Non-violent Restraint(s) Outcome: Progressing   Problem: Education: Goal: Ability to describe self-care measures that may prevent or decrease complications (Diabetes Survival Skills Education) will improve Outcome: Progressing Goal: Individualized Educational Video(s) Outcome: Progressing   Problem: Coping: Goal: Ability to adjust to condition or change in health will improve Outcome: Progressing   Problem: Fluid Volume: Goal: Ability to maintain a balanced intake and output will improve Outcome: Progressing   Problem: Health Behavior/Discharge Planning: Goal: Ability to identify and utilize available resources and services will improve Outcome: Progressing Goal: Ability to manage health-related needs will improve Outcome: Progressing   Problem: Metabolic: Goal: Ability to maintain appropriate glucose levels will improve Outcome: Progressing   Problem: Nutritional: Goal:  Maintenance of adequate nutrition will improve Outcome: Progressing Goal: Progress toward achieving an optimal weight will improve Outcome: Progressing   Problem: Skin Integrity: Goal: Risk for impaired skin integrity will decrease Outcome: Progressing   Problem: Tissue Perfusion: Goal: Adequacy of tissue perfusion will improve Outcome: Progressing

## 2023-04-05 NOTE — Consult Note (Signed)
[name] Consult Evaluation  Service Date: April 05, 2023 LOS:  LOS: 24 days    Primary Psychiatric Diagnoses  Delirium 2.  Acute metabolic encephalopathy   Assessment  BOJAN BOMKAMP is a 72 y.o. male admitted medically to the hospitalists service on 02/15/23 for confusion and SOB in the context of a recent COVID infection and admitted to acute care on 03/12/2023  4:25 PM for acute metabolic encephalopathy assumed due to recent COVID infection.  He has no past psychiatric diagnosis and has a past medical history of COPD, IDT2DM, GERD, and ESRD. Psychiatry was consulted for encephalopathy and agitation by Dr. Natale Lay.  The patient's presentation remains most consistent with hypoactive delirium, given relatively acute onset, visual hallucinations, and agitation worse at night. Most likely due to multiple etiologies including but not limited to infection, medications, pain, altered sleep/wake cycle, and limited mobility.   Patient has had no reported adverse affects to Abilify. He has had minimal agitation over the past 48 hours. He denies SI, HI, and AVH. At this time, we do not assess him as a threat to himself or others.  Minimization of delirogenic insults will continue to be of utmost importance. This includes promoting the normal circadian cycle, minimizing lines/tubes, avoiding deliriogenic medications such as benzodiazepines and anticholinergic medications, and frequently reorienting the patient. Symptomatic treatment for agitation can be provided by antipsychotic medications, though it is important to remember that these do not treat the underlying etiology of delirium. Notably, there can be a time lag effect between treatment of a medical problem and resolution of delirium. This time lag effect may be of longer duration in the elderly, and those with underlying cognitive impairment or brain injury.   Diagnoses:  Active Hospital problems: Principal Problem:   Acute  metabolic encephalopathy Active Problems:   Type 2 diabetes mellitus with chronic kidney disease on chronic dialysis, with long-term current use of insulin (HCC)   HFrEF (heart failure with reduced ejection fraction) (HCC)   Morbid obesity (HCC)   Encephalopathy   Epistaxis   PAF (paroxysmal atrial fibrillation) (HCC)   Long term (current) use of insulin (HCC)   ESRD on dialysis (HCC)   Debility     Plan   ## Psychiatric Medication Recommendations:  -- Continue Abilify 5 mg, PO, daily at bedtime -- Consider memantine if symptoms of delirium do not improve  ## Medical Decision Making Capacity:  -- Clearly lacks for most decisions; wife is listed as HCPOA  ## Further Work-up:  -- Most recent EKG on 03/02/23 had QtC of 469 -- Pertinent labwork reviewed earlier this admission includes: reasonably extensive work-up largely wnl with the exception of labs associated with ESRD including low eGFR, high BUN, and high albumin, all of which are associated with an increased risk of dementia, delirium, and acute metabolic encephalopathy.  ## Disposition:  -- Patient's wife prefers that patient be discharged to a rehab facility; if not possible she prefers he be discharged home and to transport patient to a dialysis center as needed. No psychiatric contraindications to this plan - defer to PT, OT, and medical assessment. Patient's wife states that a family member can accompany him to dialysis center to help reduce the risk and complications of agitation. Patient has responded well to Abilify and has had no reported significant agitation over the past 48 hours. -- Recommend a TOC consult  ## Behavioral / Environmental:  --  Delirium Precautions: Delirium Interventions for Nursing and Staff: - RN to open  blinds every AM. - To Bedside: Glasses and pt's own shoes. Make available to patient when possible and encourage use. - Encourage po fluids when appropriate, keep fluids within reach. - OOB to  chair with meals. - Passive ROM exercises to all extremities with AM & PM care. - RN to assess orientation to person, time and place QAM and PRN. - Recommend extended visitation hours with familiar family/friends as feasible. - Staff to minimize disturbances at night. Turn off television when pt asleep or when not in use.  - When patient is awake and watching TV, his wife reports that he likes watching western movies, racing, boxing, or [place]  ## Safety and Observation Level:  - Based on my clinical evaluation, I estimate the patient to be at low risk of self harm in the current setting - At this time, we recommend a routine level of observation. This decision is based on my review of the chart including patient's history and current presentation, interview of the patient, mental status examination, and consideration of suicide risk including evaluating suicidal ideation, plan, intent, suicidal or self-harm behaviors, risk factors, and protective factors. This judgment is based on our ability to directly address suicide risk, implement suicide prevention strategies and develop a safety plan while the patient is in the clinical setting. Please contact our team if there is a concern that risk level has changed.  Suicide risk assessment: Patient has following modifiable risk factors for suicide: access to guns, which we are addressing by the patient's wife planning to lock up the guns prior to the patient returning home.   Patient has following non-modifiable or demographic risk factors for suicide: male gender and psychiatric hospitalization  Patient has the following protective factors against suicide: Supportive family, Supportive friends, and no history of suicide attempts  Thank you for this consult request. Recommendations have been communicated to the primary team.  We will sign off at this time.   [place] E Mithcell Schumpert, Medical Student  Psychiatric and Social History   Relevant Aspects of  Hospital Course:  Admitted on 02/15/23 for confusion and SOB in the context of a recent COVID infection. He was found to have sepsis secondary to peritoneal dialysis associated catheter peritonitis and polymicrobial bacteremia/fungemia. Broad spectrum antibiotics started. Hospital course complicated by worsening encephalopathy with hypoxemia requiring transfer to the ICU.   Patient Report:  Patient was highly somnolent, but cooperative throughout interview. Patient briefly aroused to calls of his first name and was able to stay awake long enough answer some questions (reported elsewhere in this note).  Psych ROS:  Depression: In hospital, patient displays signs of sleep disturbance, fatigue, diminished concentration and cognition, and psychomotor changes. 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  Anxiety: Patient did not express or display concerns for anxiety, but was somnolent throughout interview Mania (lifetime and current): No reported symptoms of mania  Psychosis: (lifetime and current): Patient's wife reports that over the course of his hospital stay, at times the patient had hallucinations about deceased or living family members who are not currently in the room with him. None reported in the past 48 hours.  Collateral information:  Patient's wife was present in patient's room on interview today. She reports that patient has been doing well today, other than stating that he did not want to go to dialysis this morning, believing  that he had attended dialysis yesterday.  Contacted patient's wife, Rodrigo Pezzullo, at 3:35 pm on 04/04/23. She states that she first noticed changes in the patient's mental status shortly after patient had COVID. She states that he tested positive for COVID at an urgent care in [place] on 01/28/23 and that around that time  he began becoming more forgetful. On 02/14/23 the patient's wife observed that he thought he was in the bathroom when he was in the bedroom, and that he had recently been running a fever of 101.3 before she gave him a bath to cool him down, so she called an ambulance. He was transported to [facility] and subsequently transferred to [facility]. She reports that over the course of his 7-week hospital stay, he has become progressively worse. She reports that in the first few days of admission he was able to request specific television shows to watch, but that he has become progressively somnolent and has had periods with hallucinations. She reports that he has at times called out to deceased or living relatives who were not in the room.   Psychiatric History:  Information collected from patient's wife and EMR  Prev Psych Dx/Sx: None Current Psych Provider: None Home Meds (current): None Previous Psych Med Trials: No past history Therapy: No past history  Prior ECT: Wife denies Prior Psych Hospitalization: Wife denies Prior Self Harm: Wife denies Prior Violence: Wife denies  Family Psych History: Wife states that there is no known psych history in the patient's family Family Hx suicide: Wife denies  Social History:  Developmental Hx: None on record Educational Hx: Deferred Occupational Hx: Patient worked as a Naval architect for 32 years Legal Hx: Wife states patient currently has no legal issues Living Situation: Lives with wife at home Spiritual Hx: Patient normally attends church every Sunday and participates in Sunday school Access to weapons: Wife states that there are currently guns in the home, but she plans to have them locked up prior to the patient returning home  Substance History Tobacco use: Wife states patient does not use tobacco Alcohol use: Wife states patient does not use alcohol Drug use: Wife states patient does not use drugs   Exam Findings   Psychiatric Specialty  Exam: Patient is oriented to self and generally arouses to the call of his first name. His is aware that his wife is in the room with him and that she is his wife. He is highly somnolent, making his attention limited. He reports that his mood is "good." He has a relatively flat affect. His speech is soft, and when he responds to questions, he only does so few words, making his thought process, insight, and judgement difficult to assess. He denies SI, HI, or AVH. He is cooperative throughout the interview and displays no signs of agitation.  Physical Exam: Vital signs:  Temp:  [97.8 F (36.6 C)-98.3 F (36.8 C)] 97.8 F (36.6 C) (09/19 1423) Pulse Rate:  [81-96] 94 (09/19 1529) Resp:  [17-28] 28 (09/19 1529) BP: (120-167)/(68-103) 135/68 (09/19 1529) SpO2:  [80 %-98 %] 94 % (09/19 1529) Weight:  [97.1 kg-99.4 kg] 97.1 kg (09/19 1423)  Physical Exam Constitutional:      Appearance: Normal appearance.  HENT:     Head: Normocephalic and atraumatic.  Eyes:     Conjunctiva/sclera: Conjunctivae normal.  Cardiovascular:     Rate and Rhythm: Normal rate.  Pulmonary:     Effort: Pulmonary effort is normal.    Blood pressure 135/68, pulse  94, temperature 97.8 F (36.6 C), resp. rate (!) 28, height 5\' 4"  (1.626 m), weight 97.1 kg, SpO2 94%. Body mass index is 36.74 kg/m.   Other History   These have been pulled in through the EMR, reviewed, and updated if appropriate.   Family History:  The patient's family history includes Arthritis in an other family member; Asthma in an other family member; Cancer in his mother; Diabetes in his father, mother, and sister; Hypertension in his father, mother, and sister; Lung disease in an other family member.  Medical History: Past Medical History:  Diagnosis Date   Anemia    Arthritis    Asthma    Bell palsy    CAD (coronary artery disease)    a. 2014 MV: abnl w/ infap ischemia; b. 03/2013 Cath: aneurysmal bleb in the LAD w/ otw nonobs dzs-->Med  Rx.   Chronic back pain    Chronic knee pain    a. 09/2015 s/p R TKA.   Chronic pain    Chronic shoulder pain    Chronic sinusitis    COPD (chronic obstructive pulmonary disease) (HCC)    Diabetes mellitus without complication (HCC)    type II    ESRD on peritoneal dialysis (HCC)    on peritoneal dialysis, [facility]   Essential hypertension    GERD (gastroesophageal reflux disease)    Gout    Gout    Hepatomegaly    noted on noncontrast CT 2015   History of hiatal hernia    Hyperlipidemia    Lateral meniscus tear    Obesity    Truncal   Obstructive sleep apnea    does not use cpap    On home oxygen therapy    uses 2l when is going somewhere per patient    PUD (peptic ulcer disease)    remote, reports f/u EGD about 8 years ago unremarkable    Reactive airway disease    related to exposure to chemical during 9/11   Sinusitis    Vitamin D deficiency     Surgical History: Past Surgical History:  Procedure Laterality Date   A/V FISTULAGRAM N/A 02/28/2023   Procedure: A/V Fistulagram;  Surgeon: Victorino Sparrow, MD;  Location: [facility] INVASIVE CV LAB;  Service: Cardiovascular;  Laterality: N/A;   ASAD LT SHOULDER  12/15/2008   left shoulder   AV FISTULA PLACEMENT Left 08/09/2016   Procedure: BRACHIOCEPHALIC ARTERIOVENOUS (AV) FISTULA CREATION LEFT ARM;  Surgeon: Sherren Kerns, MD;  Location: [facility] OR;  Service: Vascular;  Laterality: Left;   CAPD INSERTION N/A 10/07/2018   Procedure: LAPAROSCOPIC PERITONEAL CATHETER PLACEMENT;  Surgeon: Rodman Pickle, MD;  Location: [facility] ORS;  Service: General;  Laterality: N/A;   CATARACT EXTRACTION W/PHACO Left 03/28/2016   Procedure: CATARACT EXTRACTION PHACO AND INTRAOCULAR LENS PLACEMENT LEFT EYE;  Surgeon: Jethro Bolus, MD;  Location: [facility] ORS;  Service: Ophthalmology;  Laterality: Left;  CDE: 4.77   CATARACT EXTRACTION W/PHACO Right 04/11/2016   Procedure: CATARACT EXTRACTION PHACO AND INTRAOCULAR LENS PLACEMENT RIGHT EYE; CDE:   4.74;  Surgeon: Jethro Bolus, MD;  Location: [facility] ORS;  Service: Ophthalmology;  Laterality: Right;   COLONOSCOPY  10/15/2008   Fields: Rectal polyp obliterated, not retrieved, hemorrhoids, single ascending colon diverticulum near the CV. Next colonoscopy April 2020   COLONOSCOPY N/A 12/25/2014   SLF: 1. Colorectal polyps (2) removed 2. Small internal hemorrhoids 3. the left colon is severely redundant. hyperplastic polyps   CORONARY ARTERY BYPASS GRAFT N/A 06/05/2022  Procedure: OFF PUMP CORONARY ARTERY BYPASS GRAFTING (CABG) X 2 BYPASSES USING LEFT INTERNAL MAMMARY ARTERY AND RIGHT LEG GREATER SAPHENOUS VEIN HARVESTED ENDOSCOPICALLY;  Surgeon: Corliss Skains, MD;  Location: [facility] OR;  Service: Open Heart Surgery;  Laterality: N/A;   CORONARY STENT INTERVENTION N/A 07/25/2021   Procedure: CORONARY STENT INTERVENTION;  Surgeon: Tonny Bollman, MD;  Location: [facility] INVASIVE CV LAB;  Service: Cardiovascular;  Laterality: N/A;   CORONARY STENT INTERVENTION N/A 12/26/2021   Procedure: CORONARY STENT INTERVENTION;  Surgeon: Yvonne Kendall, MD;  Location: [facility] INVASIVE CV LAB;  Service: Cardiovascular;  Laterality: N/A;   CORONARY STENT INTERVENTION N/A 01/20/2022   Procedure: CORONARY STENT INTERVENTION;  Surgeon: Tonny Bollman, MD;  Location: [facility] INVASIVE CV LAB;  Service: Cardiovascular;  Laterality: N/A;   DOPPLER ECHOCARDIOGRAPHY     ESOPHAGOGASTRODUODENOSCOPY N/A 12/25/2014   SLF: 1. Anemia most likely due to CRI, gastritis, gastric polyps 2. Moderate non-erosive gastriits and mild duodenitis.  3.TWo large gstric polyps removed.    EYE SURGERY  12/22/2010   tear duct probing-[place]   FOREIGN BODY REMOVAL  03/29/2011   Procedure: REMOVAL FOREIGN BODY EXTREMITY;  Surgeon: Fuller Canada, MD;  Location: [facility];  Service: Orthopedics;  Laterality: Right;  Removal Foreign Body Right Thumb   IR FLUORO GUIDE CV LINE RIGHT  08/06/2018   IR FLUORO GUIDE CV LINE RIGHT  02/17/2023   IR US GUIDE VASC ACCESS  RIGHT  08/06/2018   IR US GUIDE VASC ACCESS RIGHT  02/17/2023   KNEE ARTHROSCOPY  10/16/2007   left   KNEE ARTHROSCOPY WITH LATERAL MENISECTOMY Right 10/14/2015   Procedure: LEFT KNEE ARTHROSCOPY WITH PARTIAL LATERAL MENISECTOMY;  Surgeon: Vickki Hearing, MD;  Location: [facility];  Service: Orthopedics;  Laterality: Right;   LEFT HEART CATH AND CORONARY ANGIOGRAPHY N/A 07/25/2021   Procedure: LEFT HEART CATH AND CORONARY ANGIOGRAPHY;  Surgeon: Tonny Bollman, MD;  Location: [facility] INVASIVE CV LAB;  Service: Cardiovascular;  Laterality: N/A;   LEFT HEART CATH AND CORONARY ANGIOGRAPHY N/A 12/26/2021   Procedure: LEFT HEART CATH AND CORONARY ANGIOGRAPHY;  Surgeon: Yvonne Kendall, MD;  Location: [facility] INVASIVE CV LAB;  Service: Cardiovascular;  Laterality: N/A;   LEFT HEART CATH AND CORONARY ANGIOGRAPHY N/A 01/20/2022   Procedure: LEFT HEART CATH AND CORONARY ANGIOGRAPHY;  Surgeon: Tonny Bollman, MD;  Location: [facility] INVASIVE CV LAB;  Service: Cardiovascular;  Laterality: N/A;   LEFT HEART CATHETERIZATION WITH CORONARY ANGIOGRAM N/A 03/28/2013   Procedure: LEFT HEART CATHETERIZATION WITH CORONARY ANGIOGRAM;  Surgeon: Marykay Lex, MD;  Location: [facility] CATH LAB;  Service: Cardiovascular;  Laterality: N/A;   NM MYOVIEW LTD     PENILE PROSTHESIS IMPLANT N/A 08/16/2015   Procedure: PENILE PROTHESIS INFLATABLE, three piece, Excisional biopsy of Penile ulcer, Penile molding;  Surgeon: Jethro Bolus, MD;  Location: [facility]S;  Service: Urology;  Laterality: N/A;   PENILE PROSTHESIS IMPLANT N/A 12/24/2017   Procedure: REMOVAL AND  REPLACEMENT  COLOPLAST PENILE PROSTHESIS;  Surgeon: Crista Elliot, MD;  Location: [facility];  Service: Urology;  Laterality: N/A;   PERIPHERAL VASCULAR BALLOON ANGIOPLASTY  02/28/2023   Procedure: PERIPHERAL VASCULAR BALLOON ANGIOPLASTY;  Surgeon: Victorino Sparrow, MD;  Location: [facility] INVASIVE CV LAB;  Service: Cardiovascular;;   PORT-A-CATH REMOVAL N/A 02/19/2023   Procedure: REMOVAL OF  PERITONEAL DIALYSIS CATHETER;  Surgeon: Victorino Sparrow, MD;  Location: [facility] OR;  Service: Vascular;  Laterality: N/A;   QUADRICEPS TENDON REPAIR  07/21/2011   Procedure: REPAIR QUADRICEP TENDON;  Surgeon:  Fuller Canada, MD;  Location: [facility];  Service: Orthopedics;  Laterality: Right;   RIGHT/LEFT HEART CATH AND CORONARY ANGIOGRAPHY N/A 05/30/2022   Procedure: RIGHT/LEFT HEART CATH AND CORONARY ANGIOGRAPHY;  Surgeon: Corky Crafts, MD;  Location: [facility] INVASIVE CV LAB;  Service: Cardiovascular;  Laterality: N/A;   TEE WITHOUT CARDIOVERSION N/A 06/05/2022   Procedure: TRANSESOPHAGEAL ECHOCARDIOGRAM (TEE);  Surgeon: Corliss Skains, MD;  Location: [facility] OR;  Service: Open Heart Surgery;  Laterality: N/A;   TOENAIL EXCISION     removed x2-bilateral   UMBILICAL HERNIA REPAIR  07/17/2005   [place]    Medications:   Current Facility-Administered Medications:    (feeding supplement) PROSource Plus liquid 30 mL, 30 mL, Oral, BID BM, Stovall, Kathryn R, PA-C, 30 mL at 03/31/23 0900   acetaminophen (TYLENOL) tablet 325-650 mg, 325-650 mg, Oral, Q4H PRN, Milinda Antis, PA-C, 650 mg at 03/26/23 2002   ARIPiprazole (ABILIFY) tablet 5 mg, 5 mg, Oral, QHS, Fanny Dance, MD, 5 mg at 04/04/23 2004   aspirin EC tablet 81 mg, 81 mg, Oral, Daily, Milinda Antis, PA-C, 81 mg at 04/05/23 0848   atorvastatin (LIPITOR) tablet 80 mg, 80 mg, Oral, Daily, Milinda Antis, PA-C, 80 mg at 04/05/23 0847   bisacodyl (DULCOLAX) EC tablet 5 mg, 5 mg, Oral, Daily PRN, Setzer, Sandra J, PA-C   Chlorhexidine Gluconate Cloth 2 % PADS 6 each, 6 each, Topical, Q0600, Tomasa Blase, PA-C, 6 each at 04/05/23 0506   Darbepoetin Alfa (ARANESP) injection 150 mcg, 150 mcg, Subcutaneous, Q Sun-1800, Paytes, Austin A, RPH, 150 mcg at 03/25/23 1750   divalproex (DEPAKOTE SPRINKLE) capsule 125 mg, 125 mg, Oral, Q12H, Fanny Dance, MD   feeding supplement (ENSURE ENLIVE / ENSURE PLUS) liquid 237 mL, 237 mL,  Oral, BID WC, Setzer, Sandra J, PA-C, 237 mL at 04/03/23 2217   Gerhardt's butt cream 1 Application, 1 Application, Topical, QID, Setzer, Lynnell Jude, PA-C, 1 Application at 04/05/23 0847   guaiFENesin-dextromethorphan (ROBITUSSIN DM) 100-10 MG/5ML syrup 10 mL, 10 mL, Oral, Q6H PRN, Setzer, Sandra J, PA-C, 10 mL at 04/03/23 1633   lidocaine (LIDODERM) 5 % 1 patch, 1 patch, Transdermal, Q2200, Setzer, Lynnell Jude, PA-C, 1 patch at 03/31/23 2136   lidocaine (LIDODERM) 5 % 1 patch, 1 patch, Transdermal, Q24H, Skip Mayer A, MD, 1 patch at 04/01/23 1955   LORazepam (ATIVAN) tablet 0.5 mg, 0.5 mg, Oral, Q6H PRN, 0.5 mg at 04/05/23 0848 **OR** LORazepam (ATIVAN) injection 0.5 mg, 0.5 mg, Intramuscular, Q6H PRN, Setzer, Sandra J, PA-C, 0.5 mg at 04/05/23 1523   magnesium oxide (MAG-OX) tablet 400 mg, 400 mg, Oral, Daily, Setzer, Lynnell Jude, PA-C, 400 mg at 04/05/23 0849   mometasone-formoterol (DULERA) 200-5 MCG/ACT inhaler 2 puff, 2 puff, Inhalation, BID, Setzer, Lynnell Jude, PA-C, 2 puff at 04/05/23 0808   Muscle Rub CREA, , Topical, PRN, Fanny Dance, MD   ondansetron [facility]) tablet 4 mg, 4 mg, Oral, Q6H PRN, 4 mg at 03/16/23 2154 **OR** ondansetron (ZOFRAN) injection 4 mg, 4 mg, Intravenous, Q6H PRN, Setzer, Lynnell Jude, PA-C, 4 mg at 04/03/23 1603   oxyCODONE (Oxy IR/ROXICODONE) immediate release tablet 2.5 mg, 2.5 mg, Oral, TID PRN, Elijah Birk C, DO, 2.5 mg at 04/04/23 2004   pantoprazole (PROTONIX) EC tablet 40 mg, 40 mg, Oral, BID, Setzer, Lynnell Jude, PA-C, 40 mg at 04/05/23 0913   traZODone (DESYREL) tablet 50 mg, 50 mg, Oral, QHS PRN, Ranelle Oyster, MD, 50 mg at 04/04/23 2004  traZODone (DESYREL) tablet 50 mg, 50 mg, Oral, QHS, Ranelle Oyster, MD, 50 mg at 04/04/23 2004  Allergies: Allergies  Allergen Reactions   Opana [Oxymorphone Hcl] Itching   Tramadol Itching

## 2023-04-05 NOTE — Progress Notes (Signed)
Occupational Therapy Session Note  Patient Details  Name: [name] [name] MRN: [id number] Date of Birth: Mar 12, 1951  Today's Date: 04/05/2023 OT Individual Time: 2956-2130 OT Individual Time Calculation (min): 39 min    Short Term Goals: Week 4:  OT Short Term Goal 1 (Week 4): To continue to consistently meet LTG.  Skilled Therapeutic Interventions/Progress Updates:    Pt greeted at nurses station with eyes closed. Pt brought to therapy gym and attempted to get pt to participate in meaningful task. Pt needed multimodal cues to keep his eyes open. Pt began taking hs shirt off but unable to state why he was doing so. Pt perseverating on taking tape off of his arms from blood draws and dialysis. OT assisted with removing tape, but then pt perseverative on trying to remove arm bands. OT attempted to engage pt in UB there-ex and sit<>stands with max multimodal cues but unable to get pt to initiate. Pt would open his eyes at times to try to locate the tape or arm bands he was trying to pull off. OT brought pt back to room where wife had brought him food. Attempted to engage pt in self-feeding task, but at this time was declining. Pt left with alarm belt on and spouse present assisting with trying to get pt to eat.   Therapy Documentation Precautions:  Precautions Precautions: Fall Restrictions Weight Bearing Restrictions: No  Pain:  Denies pain    Therapy/Group: Individual Therapy  [name] 04/05/2023, 10:49 AM

## 2023-04-05 NOTE — Progress Notes (Signed)
   04/05/23 1820  Vitals  Temp 97.8 F (36.6 C)  Pulse Rate 82  Resp (!) 21  BP 136/81  SpO2 91 %  Weight 94.3 kg  Post Treatment  Dialyzer Clearance Lightly streaked  Hemodialysis Intake (mL) 0 mL  Liters Processed 84  Fluid Removed (mL) 2500 mL  Tolerated HD Treatment Yes  AVG/AVF Arterial Site Held (minutes) 10 minutes  AVG/AVF Venous Site Held (minutes) 10 minutes   Received patient in bed to unit.  Alert and oriented.  Informed consent signed and in chart.   TX duration:3.5HRS  Patient tolerated well.  Transported back to the room  Alert, without acute distress.  Hand-off given to patient's nurse.   Access used: LAVF Access issues: NONE  Total UF removed: 2.5L Medication(s) given: ATIVAN 0.5MG     [name]

## 2023-04-05 NOTE — Progress Notes (Signed)
PROGRESS NOTE   Subjective/Complaints: No acute events overnight noted.  Appears to be more alert this morning.  He declined multiple medications with nursing however he told me he would take them after breakfast.   ROS: Limited due to cognitive/behavioral   Objective:   No results found. No results for input(s): "WBC", "HGB", "HCT", "PLT" in the last 72 hours.    No results for input(s): "NA", "K", "CL", "CO2", "GLUCOSE", "BUN", "CREATININE", "CALCIUM" in the last 72 hours.     Intake/Output Summary (Last 24 hours) at 04/05/2023 1321 Last data filed at 04/05/2023 0900 Gross per 24 hour  Intake 75 ml  Output --  Net 75 ml         Physical Exam: Vital Signs Blood pressure (!) 167/103, pulse 87, temperature 98.3 F (36.8 C), temperature source Oral, resp. rate 20, height 5\' 4"  (1.626 m), weight 99.4 kg, SpO2 96%.  Gen: no distress, sitting in nurses station in his wheelchair HEENT: oral mucosa pink and moist, NCAT Cardio: RRR Chest: CTAB, normal effort, normal rate of breathing Abd: soft, non-distended, positive bowel sounds Ext: Trace bilateral lower extremity edema Skin: intact  Ext: no clubbing, cyanosis, or edema Psych: Confused, not agitated at this time Neurologic Exam:   Appears eating breakfast, appears less drowsy Oriented to self, rehabilitation center + Memory deficit - ongoing  Sensory exam: Intact to touch in all 4 extremities Motor exam: moves all 4 extremities without limitiations Coordination: Slight bilateral upper extremity tremor, intention, mild.    Musculoskeletal: No joint swelling noted No abnormal tone      Assessment/Plan: 1. Functional deficits which require 15 hr per week of interdisciplinary therapy in a comprehensive inpatient rehab setting. Physiatrist is providing close team supervision and 24 hour management of active medical problems listed below. Physiatrist and rehab  team continue to assess barriers to discharge/monitor patient progress toward functional and medical goals  Care Tool:  Bathing    Body parts bathed by patient: Right arm, Left arm, Chest, Abdomen, Front perineal area, Right upper leg, Buttocks, Left upper leg, Right lower leg, Left lower leg, Face         Bathing assist Assist Level: Moderate Assistance - Patient 50 - 74%     Upper Body Dressing/Undressing Upper body dressing   What is the patient wearing?: Pull over shirt    Upper body assist Assist Level: Minimal Assistance - Patient > 75%    Lower Body Dressing/Undressing      Lower body dressing      What is the patient wearing?: Pants     Lower body assist Assist for lower body dressing: Moderate Assistance - Patient 50 - 74%     Toileting Toileting    Toileting assist Assist for toileting: Moderate Assistance - Patient 50 - 74%     Transfers Chair/bed transfer  Transfers assist     Chair/bed transfer assist level: Minimal Assistance - Patient > 75%     Locomotion Ambulation   Ambulation assist      Assist level: Minimal Assistance - Patient > 75% Assistive device: No Device Max distance: 175'   Walk 10 feet activity   Assist  Assist level: Contact Guard/Touching assist Assistive device: No Device   Walk 50 feet activity   Assist Walk 50 feet with 2 turns activity did not occur: Safety/medical concerns  Assist level: Minimal Assistance - Patient > 75% Assistive device: No Device    Walk 150 feet activity   Assist Walk 150 feet activity did not occur: Safety/medical concerns  Assist level: Minimal Assistance - Patient > 75% Assistive device: No Device    Walk 10 feet on uneven surface  activity   Assist Walk 10 feet on uneven surfaces activity did not occur: Safety/medical concerns         Wheelchair     Assist Is the patient using a wheelchair?: Yes Type of Wheelchair: Manual    Wheelchair assist  level: Dependent - Patient 0%      Wheelchair 50 feet with 2 turns activity    Assist        Assist Level: Dependent - Patient 0%   Wheelchair 150 feet activity     Assist      Assist Level: Dependent - Patient 0%   Blood pressure (!) 167/103, pulse 87, temperature 98.3 F (36.8 C), temperature source Oral, resp. rate 20, height 5\' 4"  (1.626 m), weight 99.4 kg, SpO2 96%.  Medical Problem List and Plan: 1. Functional deficits secondary to metabolic encephalopathy due to sepsis secondary to catheter peritonitis and hypoxemia              -patient may not shower             -ELOS/Goals: 16-18 days, PT/OT Sup, SLP min A -tentative discharge date 9-9           -Continue CIR therapies including PT, OT, and SLP   -Will review records from [facility] -- has worsened since approximately 8/22, possible 8/19, consistent with records from nursing at [facility] with current behavior.    - continue enclosure bed for safety  -Therapy QD schedule  2.  Antithrombotics: -DVT/anticoagulation:  Pharmaceutical: Heparin, 03/23/23 changed to Lovenox yesterday to help improved compliance             -antiplatelet therapy: aspirin and Plavix   3. Pain Management: Tylenol as needed             -Lidoderm patches to back  -Elavil 25 mg at bedtime for nerve pain and sleep 8/27  -8/29: Pain well controlled on Elavil; DC lyrica, PRN Tylenol/oxy 2.5-5 mg d/t CKD  8-30: Appears patient has been getting pain medications together overnight along with antipsychotics, will discuss with nursing  8-31: Patient having an difficulty endorsing needs and not utilizing PRNs, scheduled oxycodone 5 mg 3 times daily and has 2.5 mg prn 3 times daily available.   9-1: Not utilizing PRNs, minimal complaints of pain, will discontinue scheduled given lethargy today..  -muscle rub R shoulder  9/16 patient reports pain is under control overall   4. Mood/Behavior/Sleep: LCSW to evaluate and provide emotional support              -continue Seroquel 50 mg TID             -clonazepam 0.5 mg TID prn             -Geodon 10 mg IM q 12 hours prn             -antipsychotic agents: n/a  -8-27: Agitated by soft restraints in bed, transition to 4 side rails, Posey alarm, and TeleSitter for now.  Veil  bed on standby if needed, however feel we should avoid this if possible due to likelihood of agitating patient further   - 8/28: Placed in veil bed; wrist restraints for dialysis. Scheduled Geodon 20 mg twice daily; discontinue Seroquel and clonazepam due to no perceivable benefit. Add as needed Zyprexa 5 mg twice daily p.o. or IM due to recent cardiac arrhythmias and concern for QTc prolongation. As needed ramelteon for sleep. Stop gabapentin due to possible side effect of agitation  - 8/28: No improvement except temporarily with Geodon; add depakote 125 mg q8h for ongoing aggitation, mood stabilization. Increase elavil to 50 mg at bedtime.  Workup ongoing.    - 8/29: EEG negative, labs normal. MRI pending. Sleeping well and calm since starting depakote and increase in elavil. Will monitor tonight and wean Geodon in AM if appropriate.   8-30: MRI showing chronic microvascular changes, otherwise normal.  Increasingly calm and appropriate during the day, with ongoing agitation at nighttime.  Will increase nighttime Geodon to 40 mg; will discuss sleep log with nursing, as it has not been performed.  8-31 no sleep overnight per sleep log, again getting agitated around 9 PM and 5 AM.  Not responsive to as needed Zyprexa, responsive to Ativan.  Attempted to wean off scheduled a.m. Geodon today, but patient became extremely agitated and unable to redirect, calm down with readministration of medication.  Resume Geodon 20 mg every morning/40 mg every afternoon, increased Elavil to 75 mg nightly, adding trazodone 50 mg nightly as needed.  P.o./IV Ativan 0.5 mg every 6 hours for agitation.  9-1: slept well overnight with trazodone and increased  Elavil, no PRNs used.  Unfortunately, oversedated today.  Discontinue scheduled pain medications as above, start wean of Elavil to 50 mg x2 days -> 25 mg x2 days -> 10 mg x2 days, off due to no apparent benefit and no complaints of pain.  Reduce Geodon from 20 mg every morning/40 mg every evening to 20 mg twice daily.  Continue trazodone as needed for sleep, as this seems to be the difference for a restful night yesterday.  If he remains overly sedated, would dose reduce and DC Depakote next.  Keep Ativan as needed d/t dialysis not having other options available.  9/2-3--non agitated but still sleepy   -dc'ed depakote as I doubt it's doing much at this dose anyway   -continue geodon at 20mg  bid   -scheduled trazodone at HS with back up dose if needed    -sleep was intermittent last night   -requires enclosure bed for safety  9/4 continue enclosure bed for now, consider trying DC of this device early next week  -9/5 appears to be doing better overall this morning, continues to have intermittent agitation at times.  Continue enclosure bed  -9/6 agitation appears to be improving, patient is skeptical about medications and has been declining some of these.  Will discontinue multivitamin, RisaQuad to try to decrease pill burden  -9/7 continues to have intermittent episodes of agitation however overall improving, appears to be taking most of his medications  9/9 agitation and mental status appears to be improving -discussing discontinuation of enclosure bed with team 9/10 Geodon dose increased to 40 mg at night, continue 20 mg in the morning 9/11 No acute events noted, will check with team regarding DC enclosure bed, nursing reports he did ok last night but had dialysis 9/12 continues to have sundowning/waxing waning confusion and agitation, continue enclosure bed for now, will caution with Geodon will  continue current dose but hold off on any escalation as this can contribute to bradycardia 9/16 no longer  using enclosure bed, continue Geodon 20 mg twice daily for now due to intermittent agitation 9/17 patient with agitation and confusion at night, has sedation during the day.  Discussed with psychiatry Dr. Gasper Sells.  Will discontinue Geodon and try Abilify tonight.  Dr. Gasper Sells also suggested considering donepezil to shorten hallucinations of delirium.  Psychiatry consult placed, will see tomorrow.  Appreciate assistance 9/18 no behavioral related notes overnight, psychiatry plans to see him today.  Appreciate assistance 9/19 patient was seen by psychiatry yesterday, recommended continue Abilify, consider donepezil or memantine, suggested avoiding stimulants.  Will continue current regimen for now as it appears he is doing better.  He appears to be less sedated and did not see frequent notes of agitation but this regimen thus far.  5. Neuropsych/cognition: This patient is not capable of making decisions on his own behalf   -recommend neuropsych consult   - 8/27: Ongoing aggitation; Consult hospitalist for further workup on potential contributors to encephalopathy. Pending UA, LA 2.1-> 1.4; otherwise negative so far -Delirium precautions, sleep log, Veiled bed for safety and to minimize restraints -Wife has expressed that she is not interested in palliative care at this time; hopefully with above measures patient can get dialysis in the next 48 hours to limit worsening uremic encephalopathy and medical decline    - 8/29: Aggitation much improved, able to get some dialysis last night and today. See above for medication changes. Calling wife tonight to discuss.   8-30: Still with moderate cognitive deficits, however appears once patient is less agitated he is better able to concentrate and has better orientation.  MRI negative for acute findings, monitor.  9/3 agitation better. More sedated than anything at this point. Confused as well.    -depakote stopped   -continue other meds as rx'ed  6.  Skin/Wound Care: Routine skin care checks             -sacral skin off-loading/protection   7. Fluids/Electrolytes/Nutrition: Strict Is and Os and follow-up chemistries per nephrology             -renal diet/ 1200 cc FR  - Renvela 800 mg 3 times daily for hyperphosphatemia added per nephrology 8-27   8: Hypotension: monitor TID and prn             -continue midodrine as needed during dialysis  Normotensive, monitor  9-1: Consistently 90s over 60s, has been stable but given findings of microvascular changes on MRI, may be too low for patient to perfuse properly.  Will add midodrine 5 mg tid and monitor.  DC Coreg as heart rate has been low to normal; may resume at low-dose once blood pressure improved.   9/2 continue with ccurrent regimen. Bp's hr remain low   -elavil wean should help  9/9-10 BP stable, continue midodrine and monitor  9/13 DC midodrine today as BP has been higher, can restart if needed      04/05/2023    5:18 AM 04/05/2023    5:00 AM 04/04/2023    9:26 PM  Vitals with BMI  Weight  219 lbs 2 oz   BMI  37.6   Systolic 167  148  Diastolic 103  82  Pulse 87  87      9: Hyperlipidemia: continue statin   10: CAD s/p CABG 2023: on DAPT and statin.  - troponin elevated but stable, monitor 8/27  11: DM-2: A1c = 6.3% (at home on Lantus 22 units at bedtime, Tradjenta 5 mg daily)        -monitor PO intake             -continue SSI (0-6 units) does not appear to be requiring coverage   - 9/6 DC SSI, change CBGs to daily  9/8 discontinue CBGs  9/16 glucose still controlled on BMP yesterday   No results for input(s): "GLUCAP" in the last 72 hours.    12: ESRD: HD on Tues, Thurs, Sat -nephrology made aware   - Unable to tolerate 8/27 d/t aggitation, stopped early 8/28;  - attempting again 8/29, stopped early again -Per nephrology, redirectable with gum -Have to switch restraints during dialysis sessions from veil bed to regular bed, then resume veil bed order once at  rehab -9/9 tolerating HD better 9/18 patient's wife indicates she would be able to stay with him during dialysis after discharge   13: COPD/OSA; chronic home 2L O2 (Symbicort, Ventolin, prednisone 40 mg daily at home)             -continue symbicort  -Added home oxygen back 8-27   14: GERD: continue Protonix   15: Non-sustained VT/pAF/bradycardia: per cardiology, Dr. Algie Coffer: -continue carvedilol 6.25 mg BID; consider increasing to 12.5 mg BID before starting low dose amiodarone  -consider Eliquis and discontinue asa and Plavix in one week if no active GI bleeding and stable hemoglobin from 8/24 -hemoglobin has remained stable but very low around 7, no recurrent A-fib, monitor for now given agitation/fall 8-31 and consider initiating Eliquis when safe -Continue mag-ox to avoid hypomagnesemia -8-27: Single instance of bradycardia this morning 40s, may have been missed read, monitor through today and do not further adjust beta-blocker medications  -9-3 hr controlled after holding coreg -9/4-5 HR stable, continue to monitor -9/9 consider Eliquis this week if agitation continues to improve -9/10 will hold off on Eliquis due to sundowning at night, will see how he does with increase Geodon -9/11 EKG for bradycardia episode today, 2nd degree A-V block mobitz type 1.  Continue to hold beta blocker,9/11 will restart eliquis at 2.5 mg BID dose. Will consult cardiology regarding need for ASA/plavix and coreg.  Dr. Elease Hashimoto does not appear convinced that he has A-fib 9/12 appreciate cardiology consult yesterday, DAPT transition to daily aspirin 81 only and Eliquis was discontinued, caution with Geodon as this can contribute to bradycardia 9/13 will decrease Geodon nighttime dose to 20 due to episodes of bradycardia 9/17 will discontinue Geodon due to variable cardiac effects 9/18 heart rate stable this morning continue to monitor     04/05/2023    5:18 AM 04/05/2023    5:00 AM 04/04/2023    9:26 PM   Vitals with BMI  Weight  219 lbs 2 oz   BMI  37.6   Systolic 167  148  Diastolic 103  82  Pulse 87  87       16: HFrEF: Daily weights. Monitor for signs of overload  - 9/7 continue daily weights, fluid level management per hemodialysis team Weight reviewed and stable  Filed Weights   04/02/23 0459 04/03/23 1530 04/05/23 0500  Weight: 97.9 kg 99.6 kg 99.4 kg      17: Liver nodularity on CT scan: LFTs reported to be normal. Avoid hepatotoxic medications    -Abdominal ultrasound earlier this admission with minimal ascites; ammonia within normal limits  -8/31: Did discuss with nephrology whether p.o. lactulose would be beneficial  despite normal ammonia, after reviewing records unsure that this would be beneficial    18: Epistaxis - resolved             -previously improved with flonase and saline spray   19: Morbid Obesity. Dietary counseling Body mass index is 39.01 kg/m.  20. Bradycardic: continue to monitor HR TID  -Discontinue Geodon  -9/19 heart rate in the 80s, stable  21. HTN: resolved after HD, continue to monitor TID  9/19 intermittently elevated, continue to monitor.  Cautious with medication addition to avoid hypotension       LOS: 24 days A FACE TO FACE EVALUATION WAS PERFORMED  Fanny Dance 04/05/2023, 1:21 PM

## 2023-04-06 ENCOUNTER — Encounter (HOSPITAL_COMMUNITY): Payer: Medicare Other

## 2023-04-06 ENCOUNTER — Ambulatory Visit: Payer: Medicare Other | Admitting: Vascular Surgery

## 2023-04-06 LAB — COMPREHENSIVE METABOLIC PANEL
ALT: 34 U/L (ref 0–44)
AST: 43 U/L — ABNORMAL HIGH (ref 15–41)
Albumin: 2.6 g/dL — ABNORMAL LOW (ref 3.5–5.0)
Alkaline Phosphatase: 86 U/L (ref 38–126)
Anion gap: 11 (ref 5–15)
BUN: 22 mg/dL (ref 8–23)
CO2: 31 mmol/L (ref 22–32)
Calcium: 9.2 mg/dL (ref 8.9–10.3)
Chloride: 93 mmol/L — ABNORMAL LOW (ref 98–111)
Creatinine, Ser: 7.07 mg/dL — ABNORMAL HIGH (ref 0.61–1.24)
GFR, Estimated: 8 mL/min — ABNORMAL LOW (ref >60)
Glucose, Bld: 106 mg/dL — ABNORMAL HIGH (ref 70–99)
Potassium: 4.1 mmol/L (ref 3.5–5.1)
Sodium: 135 mmol/L (ref 135–145)
Total Bilirubin: 1 mg/dL (ref 0.3–1.2)
Total Protein: 7 g/dL (ref 6.5–8.1)

## 2023-04-06 LAB — VALPROIC ACID LEVEL: Valproic Acid Lvl: 16 ug/mL — ABNORMAL LOW (ref 50.0–100.0)

## 2023-04-06 LAB — AMMONIA: Ammonia: 19 umol/L (ref 9–35)

## 2023-04-06 NOTE — Plan of Care (Signed)
Problem: Consults Goal: RH GENERAL PATIENT EDUCATION Description: See Patient Education module for education specifics. Outcome: Progressing   Problem: RH BOWEL ELIMINATION Goal: RH STG MANAGE BOWEL WITH ASSISTANCE Description: STG Manage Bowel with mod I Assistance. Outcome: Progressing Goal: RH STG MANAGE BOWEL W/MEDICATION W/ASSISTANCE Description: STG Manage Bowel with Medication with mod I Assistance. Outcome: Progressing   Problem: RH SAFETY Goal: RH STG ADHERE TO SAFETY PRECAUTIONS W/ASSISTANCE/DEVICE Description: STG Adhere to Safety Precautions With cues Assistance/Device. Outcome: Progressing   Problem: RH PAIN MANAGEMENT Goal: RH STG PAIN MANAGED AT OR BELOW PT'S PAIN GOAL Description: < 4 with prns Outcome: Progressing   Problem: RH KNOWLEDGE DEFICIT GENERAL Goal: RH STG INCREASE KNOWLEDGE OF SELF CARE AFTER HOSPITALIZATION Description: Patient and wife will be able to manage care at discharge with educational resources independently Outcome: Progressing   Problem: Safety: Goal: Non-violent Restraint(s) Outcome: Progressing   Problem: Education: Goal: Ability to describe self-care measures that may prevent or decrease complications (Diabetes Survival Skills Education) will improve Outcome: Progressing Goal: Individualized Educational Video(s) Outcome: Progressing   Problem: Coping: Goal: Ability to adjust to condition or change in health will improve Outcome: Progressing   Problem: Fluid Volume: Goal: Ability to maintain a balanced intake and output will improve Outcome: Progressing   Problem: Health Behavior/Discharge Planning: Goal: Ability to identify and utilize available resources and services will improve Outcome: Progressing Goal: Ability to manage health-related needs will improve Outcome: Progressing   Problem: Metabolic: Goal: Ability to maintain appropriate glucose levels will improve Outcome: Progressing   Problem: Nutritional: Goal:  Maintenance of adequate nutrition will improve Outcome: Progressing Goal: Progress toward achieving an optimal weight will improve Outcome: Progressing   Problem: Skin Integrity: Goal: Risk for impaired skin integrity will decrease Outcome: Progressing   Problem: Tissue Perfusion: Goal: Adequacy of tissue perfusion will improve Outcome: Progressing

## 2023-04-06 NOTE — Progress Notes (Signed)
PROGRESS NOTE   Subjective/Complaints: NO acute events overnight noted. Appears drowsy this AM. NO additional concerns noted.    ROS: Limited due to cognitive/behavioral   Objective:   No results found. No results for input(s): "WBC", "HGB", "HCT", "PLT" in the last 72 hours.    No results for input(s): "NA", "K", "CL", "CO2", "GLUCOSE", "BUN", "CREATININE", "CALCIUM" in the last 72 hours.     Intake/Output Summary (Last 24 hours) at 04/06/2023 1237 Last data filed at 04/05/2023 1820 Gross per 24 hour  Intake --  Output 2500 ml  Net -2500 ml         Physical Exam: Vital Signs Blood pressure (!) 151/70, pulse 68, temperature 97.8 F (36.6 C), resp. rate 18, height 5\' 4"  (1.626 m), weight 94.3 kg, SpO2 100%.  Gen: no distress, sitting on bed working with therapy HEENT: oral mucosa pink and moist, NCAT Cardio: RRR Chest: CTAB, normal effort, normal rate of breathing Abd: soft, non-distended, positive bowel sounds Ext: Trace bilateral lower extremity edema Skin: intact  Ext: no clubbing, cyanosis, or edema Psych: Confused, not agitated at this time Neurologic Exam:   Appears drowsy this AM.  Oriented to self, rehabilitation center + Memory deficit - ongoing  Sensory exam: Intact to touch in all 4 extremities Motor exam: moves all 4 extremities without limitiations Coordination: Slight bilateral upper extremity tremor, intention, mild.    Musculoskeletal: No joint swelling noted No abnormal tone      Assessment/Plan: 1. Functional deficits which require 15 hr per week of interdisciplinary therapy in a comprehensive inpatient rehab setting. Physiatrist is providing close team supervision and 24 hour management of active medical problems listed below. Physiatrist and rehab team continue to assess barriers to discharge/monitor patient progress toward functional and medical goals  Care Tool:  Bathing     Body parts bathed by patient: Right arm, Left arm, Chest, Abdomen, Front perineal area, Right upper leg, Buttocks, Left upper leg, Right lower leg, Left lower leg, Face         Bathing assist Assist Level: Moderate Assistance - Patient 50 - 74%     Upper Body Dressing/Undressing Upper body dressing   What is the patient wearing?: Pull over shirt    Upper body assist Assist Level: Minimal Assistance - Patient > 75%    Lower Body Dressing/Undressing      Lower body dressing      What is the patient wearing?: Underwear/pull up, Pants     Lower body assist Assist for lower body dressing: Maximal Assistance - Patient 25 - 49%     Toileting Toileting    Toileting assist Assist for toileting: Maximal Assistance - Patient 25 - 49%     Transfers Chair/bed transfer  Transfers assist     Chair/bed transfer assist level: Maximal Assistance - Patient 25 - 49%     Locomotion Ambulation   Ambulation assist      Assist level: Minimal Assistance - Patient > 75% Assistive device: No Device Max distance: 175'   Walk 10 feet activity   Assist     Assist level: Contact Guard/Touching assist Assistive device: No Device   Walk 50 feet activity  Assist Walk 50 feet with 2 turns activity did not occur: Safety/medical concerns  Assist level: Minimal Assistance - Patient > 75% Assistive device: No Device    Walk 150 feet activity   Assist Walk 150 feet activity did not occur: Safety/medical concerns  Assist level: Minimal Assistance - Patient > 75% Assistive device: No Device    Walk 10 feet on uneven surface  activity   Assist Walk 10 feet on uneven surfaces activity did not occur: Safety/medical concerns         Wheelchair     Assist Is the patient using a wheelchair?: Yes Type of Wheelchair: Manual    Wheelchair assist level: Dependent - Patient 0%      Wheelchair 50 feet with 2 turns activity    Assist        Assist Level:  Dependent - Patient 0%   Wheelchair 150 feet activity     Assist      Assist Level: Dependent - Patient 0%   Blood pressure (!) 151/70, pulse 68, temperature 97.8 F (36.6 C), resp. rate 18, height 5\' 4"  (1.626 m), weight 94.3 kg, SpO2 100%.  Medical Problem List and Plan: 1. Functional deficits secondary to metabolic encephalopathy due to sepsis secondary to catheter peritonitis and hypoxemia              -patient may not shower             -ELOS/Goals: 16-18 days, PT/OT Sup, SLP min A -tentative discharge date 9-9           -Continue CIR therapies including PT, OT, and SLP   -Will review records from [facility] -- has worsened since approximately 8/22, possible 8/19, consistent with records from nursing at [facility] with current behavior.    -Enclosure has been discontinued   -Therapy QD schedule  -Therapy suggest having patient set up for short time before his therapy sessions to help him wake up.  I think this would be a good idea  2.  Antithrombotics: -DVT/anticoagulation:  Pharmaceutical: Heparin, 03/23/23 changed to Lovenox yesterday to help improved compliance             -antiplatelet therapy: aspirin and Plavix   3. Pain Management: Tylenol as needed             -Lidoderm patches to back  -Elavil 25 mg at bedtime for nerve pain and sleep 8/27  -8/29: Pain well controlled on Elavil; DC lyrica, PRN Tylenol/oxy 2.5-5 mg d/t CKD  8-30: Appears patient has been getting pain medications together overnight along with antipsychotics, will discuss with nursing  8-31: Patient having an difficulty endorsing needs and not utilizing PRNs, scheduled oxycodone 5 mg 3 times daily and has 2.5 mg prn 3 times daily available.   9-1: Not utilizing PRNs, minimal complaints of pain, will discontinue scheduled given lethargy today..  -muscle rub R shoulder  9/16 patient reports pain is under control overall   4. Mood/Behavior/Sleep: LCSW to evaluate and provide emotional support              -continue Seroquel 50 mg TID             -clonazepam 0.5 mg TID prn             -Geodon 10 mg IM q 12 hours prn             -antipsychotic agents: n/a  -8-27: Agitated by soft restraints in bed, transition to 4 side rails, Posey  alarm, and TeleSitter for now.  Veil bed on standby if needed, however feel we should avoid this if possible due to likelihood of agitating patient further   - 8/28: Placed in veil bed; wrist restraints for dialysis. Scheduled Geodon 20 mg twice daily; discontinue Seroquel and clonazepam due to no perceivable benefit. Add as needed Zyprexa 5 mg twice daily p.o. or IM due to recent cardiac arrhythmias and concern for QTc prolongation. As needed ramelteon for sleep. Stop gabapentin due to possible side effect of agitation  - 8/28: No improvement except temporarily with Geodon; add depakote 125 mg q8h for ongoing aggitation, mood stabilization. Increase elavil to 50 mg at bedtime.  Workup ongoing.    - 8/29: EEG negative, labs normal. MRI pending. Sleeping well and calm since starting depakote and increase in elavil. Will monitor tonight and wean Geodon in AM if appropriate.   8-30: MRI showing chronic microvascular changes, otherwise normal.  Increasingly calm and appropriate during the day, with ongoing agitation at nighttime.  Will increase nighttime Geodon to 40 mg; will discuss sleep log with nursing, as it has not been performed.  8-31 no sleep overnight per sleep log, again getting agitated around 9 PM and 5 AM.  Not responsive to as needed Zyprexa, responsive to Ativan.  Attempted to wean off scheduled a.m. Geodon today, but patient became extremely agitated and unable to redirect, calm down with readministration of medication.  Resume Geodon 20 mg every morning/40 mg every afternoon, increased Elavil to 75 mg nightly, adding trazodone 50 mg nightly as needed.  P.o./IV Ativan 0.5 mg every 6 hours for agitation.  9-1: slept well overnight with trazodone and increased Elavil, no  PRNs used.  Unfortunately, oversedated today.  Discontinue scheduled pain medications as above, start wean of Elavil to 50 mg x2 days -> 25 mg x2 days -> 10 mg x2 days, off due to no apparent benefit and no complaints of pain.  Reduce Geodon from 20 mg every morning/40 mg every evening to 20 mg twice daily.  Continue trazodone as needed for sleep, as this seems to be the difference for a restful night yesterday.  If he remains overly sedated, would dose reduce and DC Depakote next.  Keep Ativan as needed d/t dialysis not having other options available.  9/2-3--non agitated but still sleepy   -dc'ed depakote as I doubt it's doing much at this dose anyway   -continue geodon at 20mg  bid   -scheduled trazodone at HS with back up dose if needed    -sleep was intermittent last night   -requires enclosure bed for safety  9/4 continue enclosure bed for now, consider trying DC of this device early next week  -9/5 appears to be doing better overall this morning, continues to have intermittent agitation at times.  Continue enclosure bed  -9/6 agitation appears to be improving, patient is skeptical about medications and has been declining some of these.  Will discontinue multivitamin, RisaQuad to try to decrease pill burden  -9/7 continues to have intermittent episodes of agitation however overall improving, appears to be taking most of his medications  9/9 agitation and mental status appears to be improving -discussing discontinuation of enclosure bed with team 9/10 Geodon dose increased to 40 mg at night, continue 20 mg in the morning 9/11 No acute events noted, will check with team regarding DC enclosure bed, nursing reports he did ok last night but had dialysis 9/12 continues to have sundowning/waxing waning confusion and agitation, continue enclosure bed  for now, will caution with Geodon will continue current dose but hold off on any escalation as this can contribute to bradycardia 9/16 no longer using  enclosure bed, continue Geodon 20 mg twice daily for now due to intermittent agitation 9/17 patient with agitation and confusion at night, has sedation during the day.  Discussed with psychiatry Dr. Gasper Sells.  Will discontinue Geodon and try Abilify tonight.  Dr. Gasper Sells also suggested considering donepezil to shorten hallucinations of delirium.  Psychiatry consult placed, will see tomorrow.  Appreciate assistance 9/18 no behavioral related notes overnight, psychiatry plans to see him today.  Appreciate assistance 9/19 patient was seen by psychiatry yesterday, recommended continue Abilify, consider donepezil or memantine, suggested avoiding stimulants.  Will continue current regimen for now as it appears he is doing better.  He appears to be less sedated and did not see frequent notes of agitation but this regimen thus far. 9/20 recheck CMP/ammonia/VPA level today due to continued drowsiness   5. Neuropsych/cognition: This patient is not capable of making decisions on his own behalf   -recommend neuropsych consult   - 8/27: Ongoing aggitation; Consult hospitalist for further workup on potential contributors to encephalopathy. Pending UA, LA 2.1-> 1.4; otherwise negative so far -Delirium precautions, sleep log, Veiled bed for safety and to minimize restraints -Wife has expressed that she is not interested in palliative care at this time; hopefully with above measures patient can get dialysis in the next 48 hours to limit worsening uremic encephalopathy and medical decline    - 8/29: Aggitation much improved, able to get some dialysis last night and today. See above for medication changes. Calling wife tonight to discuss.   8-30: Still with moderate cognitive deficits, however appears once patient is less agitated he is better able to concentrate and has better orientation.  MRI negative for acute findings, monitor.  9/3 agitation better. More sedated than anything at this point. Confused as well.     -depakote stopped   -continue other meds as rx'ed  6. Skin/Wound Care: Routine skin care checks             -sacral skin off-loading/protection   7. Fluids/Electrolytes/Nutrition: Strict Is and Os and follow-up chemistries per nephrology             -renal diet/ 1200 cc FR  - Renvela 800 mg 3 times daily for hyperphosphatemia added per nephrology 8-27   8: Hypotension: monitor TID and prn             -continue midodrine as needed during dialysis  Normotensive, monitor  9-1: Consistently 90s over 60s, has been stable but given findings of microvascular changes on MRI, may be too low for patient to perfuse properly.  Will add midodrine 5 mg tid and monitor.  DC Coreg as heart rate has been low to normal; may resume at low-dose once blood pressure improved.   9/2 continue with ccurrent regimen. Bp's hr remain low   -elavil wean should help  9/9-10 BP stable, continue midodrine and monitor  9/13 DC midodrine today as BP has been higher, can restart if needed  Improved, cont to monitor      04/05/2023    8:39 PM 04/05/2023    6:20 PM 04/05/2023    6:15 PM  Vitals with BMI  Weight  207 lbs 14 oz 207 lbs 14 oz  BMI  35.67 35.67  Systolic 151 136 161  Diastolic 70 81 94  Pulse 68 82 92  9: Hyperlipidemia: continue statin   10: CAD s/p CABG 2023: on DAPT and statin.  - troponin elevated but stable, monitor 8/27   11: DM-2: A1c = 6.3% (at home on Lantus 22 units at bedtime, Tradjenta 5 mg daily)        -monitor PO intake             -continue SSI (0-6 units) does not appear to be requiring coverage   - 9/6 DC SSI, change CBGs to daily  9/8 discontinue CBGs  9/16 glucose still controlled on BMP yesterday  Recheck on CMP today   No results for input(s): "GLUCAP" in the last 72 hours.    12: ESRD: HD on Tues, Thurs, Sat -nephrology made aware   - Unable to tolerate 8/27 d/t aggitation, stopped early 8/28;  - attempting again 8/29, stopped early again -Per nephrology,  redirectable with gum -Have to switch restraints during dialysis sessions from veil bed to regular bed, then resume veil bed order once at rehab -9/9 tolerating HD better 9/18 patient's wife indicates she would be able to stay with him during dialysis after discharge   13: COPD/OSA; chronic home 2L O2 (Symbicort, Ventolin, prednisone 40 mg daily at home)             -continue symbicort  -Added home oxygen back 8-27   14: GERD: continue Protonix   15: Non-sustained VT/pAF/bradycardia: per cardiology, Dr. Algie Coffer: -continue carvedilol 6.25 mg BID; consider increasing to 12.5 mg BID before starting low dose amiodarone  -consider Eliquis and discontinue asa and Plavix in one week if no active GI bleeding and stable hemoglobin from 8/24 -hemoglobin has remained stable but very low around 7, no recurrent A-fib, monitor for now given agitation/fall 8-31 and consider initiating Eliquis when safe -Continue mag-ox to avoid hypomagnesemia -8-27: Single instance of bradycardia this morning 40s, may have been missed read, monitor through today and do not further adjust beta-blocker medications  -9-3 hr controlled after holding coreg -9/4-5 HR stable, continue to monitor -9/9 consider Eliquis this week if agitation continues to improve -9/10 will hold off on Eliquis due to sundowning at night, will see how he does with increase Geodon -9/11 EKG for bradycardia episode today, 2nd degree A-V block mobitz type 1.  Continue to hold beta blocker,9/11 will restart eliquis at 2.5 mg BID dose. Will consult cardiology regarding need for ASA/plavix and coreg.  Dr. Elease Hashimoto does not appear convinced that he has A-fib 9/12 appreciate cardiology consult yesterday, DAPT transition to daily aspirin 81 only and Eliquis was discontinued, caution with Geodon as this can contribute to bradycardia 9/13 will decrease Geodon nighttime dose to 20 due to episodes of bradycardia 9/17 will discontinue Geodon due to variable cardiac  effects 9/18-20 heart rate stable      04/05/2023    8:39 PM 04/05/2023    6:20 PM 04/05/2023    6:15 PM  Vitals with BMI  Weight  207 lbs 14 oz 207 lbs 14 oz  BMI  35.67 35.67  Systolic 151 136 657  Diastolic 70 81 94  Pulse 68 82 92       16: HFrEF: Daily weights. Monitor for signs of overload  - 9/7 continue daily weights, fluid level management per hemodialysis team Weight reviewed and stable  Filed Weights   04/05/23 1423 04/05/23 1815 04/05/23 1820  Weight: 97.1 kg 94.3 kg 94.3 kg      17: Liver nodularity on CT scan: LFTs reported to be normal. Avoid  hepatotoxic medications    -Abdominal ultrasound earlier this admission with minimal ascites; ammonia within normal limits  -8/31: Did discuss with nephrology whether p.o. lactulose would be beneficial despite normal ammonia, after reviewing records unsure that this would be beneficial    18: Epistaxis - resolved             -previously improved with flonase and saline spray   19: Morbid Obesity. Dietary counseling Body mass index is 39.01 kg/m.  20. Bradycardic: continue to monitor HR TID  -Discontinue Geodon  -9/19 heart rate in the 80s, stable  21. HTN: resolved after HD, continue to monitor TID  9/19-20 intermittently elevated, continue to monitor.  Cautious with medication addition to avoid hypotension         LOS: 25 days A FACE TO FACE EVALUATION WAS PERFORMED  Fanny Dance 04/06/2023, 12:37 PM

## 2023-04-06 NOTE — Progress Notes (Signed)
Physical Therapy Weekly Progress Note  Patient Details  Name: [name] [name] MRN: [id number] Date of Birth: [birth year]  Beginning of progress report period: March 30, 2023 End of progress report period: April 06, 2023  Today's Date: 04/06/2023 PT Individual Time: 1005-1030 PT Individual Time Calculation (min): 25 min   Short term goals not set due to awaiting SNF placement. Pt progress has fluctuated this week as pt demonstrating increased lethargy, participation variable secondary to time of day pt is seen as well as level of alertness. Pt continues to be motivated to participate with therapies however is requiring increasing assistance for all mobility with lethargy and difficulty participating with therapeutic tasks due to falling asleep during sessions. Pt is min assist overall for bed mobility, transfers, and gait however can be as much as max assist or as little as CGA depending on alertness. Continue plan of care.  Patient continues to demonstrate the following deficits muscle weakness, decreased cardiorespiratoy endurance, impaired timing and sequencing, decreased coordination, and decreased motor planning,  ,  , decreased initiation, decreased attention, decreased awareness, decreased problem solving, decreased safety awareness, decreased memory, and delayed processing, and decreased standing balance, decreased postural control, and decreased balance strategies and therefore will continue to benefit from skilled PT intervention to increase functional independence with mobility.  Patient progressing toward long term goals..  Continue plan of care.  PT Short Term Goals Week 3:  PT Short Term Goal 1 (Week 3): STG = LTG due to ELOS Week 4:  PT Short Term Goal 1 (Week 4): Pt will complete transfers min assist consistently. PT Short Term Goal 2 (Week 4): Pt will complete gait 50' with min assist consistently PT Short Term Goal 3 (Week 4): Pt will complete up/down 1 step with min  assist  Skilled Therapeutic Interventions/Progress Updates:  Pt presents in room in bed, asleep, responds to verbal cues and stimulus however falls quickly back to sleep. Pt motivated at participate with therapy, denies pain but reporting muscle aches. Session focuses on improving alertness, transfer training and participation with self care tasks. Pt requires increased time to complete all tasks this session with max verbal cues to attend to task due to lethargy. Pt completes bed mobility with min assist, requires CGA/min assist for sitting balance as pt falling asleep demonstrating LOB to R and posteriorly. Therapist dons underwear and pants with max assist, pt requires mod assist to stand to pull pants over hips. Pt requires max assist for stand pivot transfer to R with x2 attempts due to pt unable to motor plan side stepping. Pt transported to behind nursing station, RN notified of pt current status. Pt remains seated with supervision from nursing staff due to pt impulsively attempting to stand, chair alarm donned and activated at end of session.  Ambulation/gait training;Community reintegration;DME/adaptive equipment instruction;Psychosocial support;Stair training;Neuromuscular re-education;Wheelchair propulsion/positioning;UE/LE Strength taining/ROM;Balance/vestibular training;Discharge planning;Pain management;Therapeutic Activities;UE/LE Coordination activities;Cognitive remediation/compensation;Functional mobility training;Disease management/prevention;Patient/family education;Therapeutic Exercise;Visual/perceptual remediation/compensation  Therapy Documentation Precautions:  Precautions Precautions: Fall Restrictions Weight Bearing Restrictions: No   Therapy/Group: Individual Therapy  Edwin Cap PT, DPT 04/06/2023, 10:33 AM

## 2023-04-06 NOTE — Progress Notes (Signed)
No behaviors noted this shift. He has been calm and cooperative and had overall pleasant affect. Pt up in chair calm and cooperative at this time with alarms activated and matts down in place.  Mylo Red, LPN

## 2023-04-06 NOTE — Progress Notes (Signed)
Patient arrived back from dialysis, able to transition to room without incident or agitation.

## 2023-04-06 NOTE — Progress Notes (Signed)
[facility] Progress Note   Subjective: Sitting in chair at nurses station. Agitated climbing out of chair. Requires constant surveillance      Objective Vitals:   04/05/23 1800 04/05/23 1815 04/05/23 1820 04/05/23 2039  BP: 132/77 (!) 154/94 136/81 (!) 151/70  Pulse: 69 92 82 68  Resp: (!) 23 (!) 21 (!) 21 18  Temp:  97.8 F (36.6 C) 97.8 F (36.6 C)   TempSrc:      SpO2: 91% 90% 91% 100%  Weight:  94.3 kg 94.3 kg   Height:       Physical Exam General: Pleasantly confused elderly male in NAD Heart: S1,S2 RRR Lungs: CTAB A/P Abdomen: NABS NT Extremities: No LE edema Dialysis Access: L AVG + T/B   Additional Objective Labs: Basic Metabolic Panel: Recent Labs  Lab 03/31/23 1420 04/01/23 0816  NA 134* 134*  K 2.9* 4.0  CL 94* 97*  CO2 29 29  GLUCOSE 102* 105*  BUN 12 37*  CREATININE 2.13* 8.09*  CALCIUM 8.0* 8.7*  PHOS <1.0* 2.8   Liver Function Tests: Recent Labs  Lab 03/31/23 1420 04/01/23 0816  ALBUMIN 2.4* 2.1*   No results for input(s): "LIPASE", "AMYLASE" in the last 168 hours. CBC: Recent Labs  Lab 03/31/23 1420  WBC 3.6*  HGB 9.6*  HCT 32.8*  MCV 98.2  PLT 155   Blood Culture    Component Value Date/Time   SDES BLOOD RIGHT HAND 02/19/2023 1855   SPECREQUEST  02/19/2023 1855    BOTTLES DRAWN AEROBIC ONLY Blood Culture results may not be optimal due to an inadequate volume of blood received in culture bottles   CULT  02/19/2023 1855    NO GROWTH 5 DAYS Performed at [facility], [address], [place], [place] [address]    REPTSTATUS 02/24/2023 FINAL 02/19/2023 1855    Cardiac Enzymes: No results for input(s): "CKTOTAL", "CKMB", "CKMBINDEX", "TROPONINI" in the last 168 hours. CBG: No results for input(s): "GLUCAP" in the last 168 hours. Iron Studies: No results for input(s): "IRON", "TIBC", "TRANSFERRIN", "FERRITIN" in the last 72 hours. @lablastinr3 @ Studies/Results: No results found. Medications:    (feeding supplement) PROSource Plus  30 mL Oral BID BM   ARIPiprazole  5 mg Oral QHS   aspirin EC  81 mg Oral Daily   atorvastatin  80 mg Oral Daily   darbepoetin (ARANESP) injection - DIALYSIS  150 mcg Subcutaneous Q Sun-1800   divalproex  125 mg Oral Q12H   feeding supplement  237 mL Oral BID WC   Gerhardt's butt cream  1 Application Topical QID   lidocaine  1 patch Transdermal Q2200   lidocaine  1 patch Transdermal Q24H   magnesium oxide  400 mg Oral Daily   mometasone-formoterol  2 puff Inhalation BID   pantoprazole  40 mg Oral BID   traZODone  50 mg Oral QHS     Dialysis Orders: Previously on [facility] thru [facility] - changed to HD - will need outpatient HD unit established prior to d/c. 4hr, 3K, EDW ~96, L AVF   Assessment/Plan: 1. Debility - in CIR 2. Encephalopathy/agitation: Waxing/waning agitation, dementia sundowning. Ammonia fine and brain MRI with no acute findings. Has been disruptive during HD, better this week. Needs sitter.  3. ESRD: Transitioned from PD to HD recently on TTS schedule. Using old AVF which is still functional, s/p f'gram 02/28/23. Next HD 9/21 4. Hypotension/volume: BP higher, mido 5mg  TID stopped 9/13 - fUF as tolerated.  5.  Anemia: Hgb 9.6 -  on Aranesp weekly. Last Tsat 38% with ferritin 2175.  No iron.  6. Metabolic bone disease: CorrCa ok, Phos low end on repeat - holding binders (renvela), no VDRA. 7.  Nutrition:  Alb very low, continue supps.  Renal diet w/fluid restrictions.  8.  Hx NSVT: On Coreg 10.  Recent culture negative PD cath-related peritonitis and associated fungemia + enterococcus bacteremia - underwent PD cath removal, TDC placement and removal, temp HD cath placement  and removal, and line holiday. Completed IV unasyn, IV micafungin and cipro on 8/15. TDC not replaced cause he had functional AVF.   11. Dispo: For SNF placement. Regarding outpatient dialysis placement- per renal navigator he will need new referral since he  has been gone from his prior clinic so long. Will need a sitter and his wife is willing. Once SNF placement is determined she can refer for outpatient clinic.   Latasha Puskas H. Gage Treiber NP-C 04/06/2023, 10:46 AM  [employer] [phone]

## 2023-04-06 NOTE — Progress Notes (Signed)
Speech Language Pathology Daily Session Note  Patient Details  Name: [name] [name] MRN: [id number] Date of Birth: 08-Aug-1950  Today's Date: 04/06/2023 SLP Individual Time: 9528-4132 SLP Individual Time Calculation (min): 23 min  Short Term Goals: Week 4: SLP Short Term Goal 1 (Week 4): Patient will demonstrate intellectual awareness of deficits via verbally stating 2 cognitive and 2 physical impairments given modA SLP Short Term Goal 2 (Week 4): Patient will demonstrate use of recommended safe swallow strategies during mealtimes with modA. SLP Short Term Goal 3 (Week 4): Patient will utilize external aids to orient to time and situation given modA SLP Short Term Goal 4 (Week 4): Patient will participate in therapy sessions in 80% of opportunities given modA multimodal cues.  Skilled Therapeutic Interventions: SLP conducted skilled therapy session targeting cognitive retraining goals. At SLP entry, patient finishing lunch with wife's assistance. Notably, patient's eyes remained closed throughout duration and patient required full feeding assistance from wife. Patient exhibited prolonged mastication, though suspect from fatigue. All bolus material eventually cleared oral cavity with liquid wash. SLP took patient to SLP office with more natural lighting to attempt to increase patient's alertness. Patient transiently responsive to cues to open eyes, however eyes would immediately close again. SLP facilitated card sorting task with patient successfully sorting 1 card given maxA once eyes were open. Given patient fatigue, SLP returned patient to room. With wife present, SLP refacilitated card game task with wife helping with alertness. Despite max cues from both SLP and wife, patient neglecting to open eyes. At one point (with eyes closed), patient reported "I can't sort them if I can't see them." Patient was left in chair with call bell in reach and chair alarm set. SLP will continue to target goals per  plan of care.       Pain Pain Assessment Pain Scale: 0-10 Pain Score: 0-No pain  Therapy/Group: Individual Therapy  [name], M.A., CCC-SLP  [name] 04/06/2023, 3:47 PM

## 2023-04-06 NOTE — Progress Notes (Signed)
Occupational Therapy Session Note  Patient Details  Name: [name] [name] MRN: [id number] Date of Birth: 1951-07-01  Today's Date: 04/06/2023 OT Individual Time: 1100-1200 OT Individual Time Calculation (min): 60 min   Short Term Goals: Week 3:  OT Short Term Goal 1 (Week 3): LTG=STG 2/2 ELOS  Skilled Therapeutic Interventions/Progress Updates:    Pt greeted seated at nurses station trying to get up from wc. Pt needed max cues to redirect to sit back in wc. Pt brought to therapy gym and worked on attention to task with nuts and bolt activity. Pt was actually able to attend to place one bolt on a screw. Pt then able to stand with min A and OT tried to get pt to place screw in hole. Pt then reported need to urinate and started urinating on floor. OT brought pt back to room to try to get him to transfer to toilet. Pt stood, but would not pivot to commode, in turn urinating all over the floor and unaware. Pt needed max multimodal cues to sit back down in wc. Pt brought to the sink to wash up. Pt needed max multimodal cues to wash peri area in sitting, then got agitated with OT when OT stated he did not actually wash that area. Pt refused to wash. Pt needed max A to don new underwear and pants with multimodal cues to initiate stand for OT to pull up pants. Pt brought back to nurses station and left seated in wc with alarm belt on, call bell in reach, and needs met.   Therapy Documentation Precautions:  Precautions Precautions: Fall Restrictions Weight Bearing Restrictions: No Pain: Pain Assessment Pain Scale: 0-10 Pain Score: 0-No pain    Therapy/Group: Individual Therapy  [name] 04/06/2023, 11:46 AM

## 2023-04-07 LAB — VITAMIN C: Vitamin C: <0.1 mg/dL — ABNORMAL LOW (ref 0.4–2.0)

## 2023-04-07 MED ORDER — LORAZEPAM 2 MG/ML IJ SOLN: 1.0000 mg | Freq: Once | INTRAMUSCULAR | Status: DC

## 2023-04-07 NOTE — Progress Notes (Signed)
Pt refused divalproex and protonix this AM. Pt educated on importance of use. Pt stated that he doesn't have seizures so he's not taking it.  Mylo Red, LPN

## 2023-04-07 NOTE — Progress Notes (Signed)
Patient slept well over night. Slight confusion, refused morning vital signs with night shift nurse and nurse tech. Attempted to get out of bed this morning and redirected back in bed. Patient currently resting in bed, bed alarm on with call bell within reach and telesitter monitor on in room.

## 2023-04-07 NOTE — Progress Notes (Signed)
PROGRESS NOTE   Subjective/Complaints: NO acute events overnight noted. Appears  Pt reports no issues Refused protonix and valproic acid. "Since doesn'st have seizures"- we need ot come up with other way to get meds in pt.      ZOX:WRUEAVW due to cognitive/behavioral  Objective:   No results found. No results for input(s): "WBC", "HGB", "HCT", "PLT" in the last 72 hours.    Recent Labs    04/06/23 1330  NA 135  K 4.1  CL 93*  CO2 31  GLUCOSE 106*  BUN 22  CREATININE 7.07*  CALCIUM 9.2      No intake or output data in the 24 hours ending 04/07/23 1146        Physical Exam: Vital Signs Blood pressure (!) 145/83, pulse 70, temperature 98.2 F (36.8 C), resp. rate 19, height 5\' 4"  (1.626 m), weight 94.3 kg, SpO2 100%.    General: asleep- brief wakefulness;  NAD HENT: conjugate gaze; oropharynx moist CV: regular rate; no JVD Pulmonary: CTA B/L; no W/R/R- good air movement GI: soft, NT, ND, (+)BS Psychiatric: appropriatesleepyl: asleep- didn't want to wake up- brief wakefulness Ext: Trace bilateral lower extremity edema Skin: intact  Ext: no clubbing, cyanosis, or edema Psych: Confused, not agitated at this time Neurologic Exam:   Appears drowsy this AM.  Oriented to self, rehabilitation center + Memory deficit - ongoing  Sensory exam: Intact to touch in all 4 extremities Motor exam: moves all 4 extremities without limitiations Coordination: Slight bilateral upper extremity tremor, intention, mild.    Musculoskeletal: No joint swelling noted No abnormal tone      Assessment/Plan: 1. Functional deficits which require 15 hr per week of interdisciplinary therapy in a comprehensive inpatient rehab setting. Physiatrist is providing close team supervision and 24 hour management of active medical problems listed below. Physiatrist and rehab team continue to assess barriers to discharge/monitor  patient progress toward functional and medical goals  Care Tool:  Bathing    Body parts bathed by patient: Right arm, Left arm, Chest, Abdomen, Front perineal area, Right upper leg, Buttocks, Left upper leg, Right lower leg, Left lower leg, Face         Bathing assist Assist Level: Moderate Assistance - Patient 50 - 74%     Upper Body Dressing/Undressing Upper body dressing   What is the patient wearing?: Pull over shirt    Upper body assist Assist Level: Minimal Assistance - Patient > 75%    Lower Body Dressing/Undressing      Lower body dressing      What is the patient wearing?: Underwear/pull up, Pants     Lower body assist Assist for lower body dressing: Maximal Assistance - Patient 25 - 49%     Toileting Toileting    Toileting assist Assist for toileting: Maximal Assistance - Patient 25 - 49%     Transfers Chair/bed transfer  Transfers assist     Chair/bed transfer assist level: Maximal Assistance - Patient 25 - 49%     Locomotion Ambulation   Ambulation assist      Assist level: Minimal Assistance - Patient > 75% Assistive device: No Device Max distance: 175'   Walk  10 feet activity   Assist     Assist level: Contact Guard/Touching assist Assistive device: No Device   Walk 50 feet activity   Assist Walk 50 feet with 2 turns activity did not occur: Safety/medical concerns  Assist level: Minimal Assistance - Patient > 75% Assistive device: No Device    Walk 150 feet activity   Assist Walk 150 feet activity did not occur: Safety/medical concerns  Assist level: Minimal Assistance - Patient > 75% Assistive device: No Device    Walk 10 feet on uneven surface  activity   Assist Walk 10 feet on uneven surfaces activity did not occur: Safety/medical concerns         Wheelchair     Assist Is the patient using a wheelchair?: Yes Type of Wheelchair: Manual    Wheelchair assist level: Dependent - Patient 0%       Wheelchair 50 feet with 2 turns activity    Assist        Assist Level: Dependent - Patient 0%   Wheelchair 150 feet activity     Assist      Assist Level: Dependent - Patient 0%   Blood pressure (!) 145/83, pulse 70, temperature 98.2 F (36.8 C), resp. rate 19, height 5\' 4"  (1.626 m), weight 94.3 kg, SpO2 100%.  Medical Problem List and Plan: 1. Functional deficits secondary to metabolic encephalopathy due to sepsis secondary to catheter peritonitis and hypoxemia              -patient may not shower             -ELOS/Goals: 16-18 days, PT/OT Sup, SLP min A -tentative discharge date 9-9           -Continue CIR therapies including PT, OT, and SLP   -Will review records from [facility] -- has worsened since approximately 8/22, possible 8/19, consistent with records from nursing at [facility] with current behavior.    -Enclosure has been discontinued   -Therapy QD schedule  -Therapy suggest having patient set up for short time before his therapy sessions to help him wake up.  I think this would be a good idea  Con't CIR PT, OT and SLP every day- refusing meds/some meds this AM 2.  Antithrombotics: -DVT/anticoagulation:  Pharmaceutical: Heparin, 03/23/23 changed to Lovenox yesterday to help improved compliance             -antiplatelet therapy: aspirin and Plavix   3. Pain Management: Tylenol as needed             -Lidoderm patches to back  -Elavil 25 mg at bedtime for nerve pain and sleep 8/27  -8/29: Pain well controlled on Elavil; DC lyrica, PRN Tylenol/oxy 2.5-5 mg d/t CKD  8-30: Appears patient has been getting pain medications together overnight along with antipsychotics, will discuss with nursing  8-31: Patient having an difficulty endorsing needs and not utilizing PRNs, scheduled oxycodone 5 mg 3 times daily and has 2.5 mg prn 3 times daily available.   9-1: Not utilizing PRNs, minimal complaints of pain, will discontinue scheduled given lethargy today..  -muscle  rub R shoulder  9/16 patient reports pain is under control overall   9/21- denies pain 4. Mood/Behavior/Sleep: LCSW to evaluate and provide emotional support             -continue Seroquel 50 mg TID             -clonazepam 0.5 mg TID prn             -  Geodon 10 mg IM q 12 hours prn             -antipsychotic agents: n/a  -8-27: Agitated by soft restraints in bed, transition to 4 side rails, Posey alarm, and TeleSitter for now.  Veil bed on standby if needed, however feel we should avoid this if possible due to likelihood of agitating patient further   - 8/28: Placed in veil bed; wrist restraints for dialysis. Scheduled Geodon 20 mg twice daily; discontinue Seroquel and clonazepam due to no perceivable benefit. Add as needed Zyprexa 5 mg twice daily p.o. or IM due to recent cardiac arrhythmias and concern for QTc prolongation. As needed ramelteon for sleep. Stop gabapentin due to possible side effect of agitation  - 8/28: No improvement except temporarily with Geodon; add depakote 125 mg q8h for ongoing aggitation, mood stabilization. Increase elavil to 50 mg at bedtime.  Workup ongoing.    - 8/29: EEG negative, labs normal. MRI pending. Sleeping well and calm since starting depakote and increase in elavil. Will monitor tonight and wean Geodon in AM if appropriate.   8-30: MRI showing chronic microvascular changes, otherwise normal.  Increasingly calm and appropriate during the day, with ongoing agitation at nighttime.  Will increase nighttime Geodon to 40 mg; will discuss sleep log with nursing, as it has not been performed.  8-31 no sleep overnight per sleep log, again getting agitated around 9 PM and 5 AM.  Not responsive to as needed Zyprexa, responsive to Ativan.  Attempted to wean off scheduled a.m. Geodon today, but patient became extremely agitated and unable to redirect, calm down with readministration of medication.  Resume Geodon 20 mg every morning/40 mg every afternoon, increased Elavil to 75  mg nightly, adding trazodone 50 mg nightly as needed.  P.o./IV Ativan 0.5 mg every 6 hours for agitation.  9-1: slept well overnight with trazodone and increased Elavil, no PRNs used.  Unfortunately, oversedated today.  Discontinue scheduled pain medications as above, start wean of Elavil to 50 mg x2 days -> 25 mg x2 days -> 10 mg x2 days, off due to no apparent benefit and no complaints of pain.  Reduce Geodon from 20 mg every morning/40 mg every evening to 20 mg twice daily.  Continue trazodone as needed for sleep, as this seems to be the difference for a restful night yesterday.  If he remains overly sedated, would dose reduce and DC Depakote next.  Keep Ativan as needed d/t dialysis not having other options available.  9/2-3--non agitated but still sleepy   -dc'ed depakote as I doubt it's doing much at this dose anyway   -continue geodon at 20mg  bid   -scheduled trazodone at HS with back up dose if needed    -sleep was intermittent last night   -requires enclosure bed for safety  9/4 continue enclosure bed for now, consider trying DC of this device early next week  -9/5 appears to be doing better overall this morning, continues to have intermittent agitation at times.  Continue enclosure bed  -9/6 agitation appears to be improving, patient is skeptical about medications and has been declining some of these.  Will discontinue multivitamin, RisaQuad to try to decrease pill burden  -9/7 continues to have intermittent episodes of agitation however overall improving, appears to be taking most of his medications  9/9 agitation and mental status appears to be improving -discussing discontinuation of enclosure bed with team 9/10 Geodon dose increased to 40 mg at night, continue 20 mg in  the morning 9/11 No acute events noted, will check with team regarding DC enclosure bed, nursing reports he did ok last night but had dialysis 9/12 continues to have sundowning/waxing waning confusion and agitation,  continue enclosure bed for now, will caution with Geodon will continue current dose but hold off on any escalation as this can contribute to bradycardia 9/16 no longer using enclosure bed, continue Geodon 20 mg twice daily for now due to intermittent agitation 9/17 patient with agitation and confusion at night, has sedation during the day.  Discussed with psychiatry Dr. Gasper Sells.  Will discontinue Geodon and try Abilify tonight.  Dr. Gasper Sells also suggested considering donepezil to shorten hallucinations of delirium.  Psychiatry consult placed, will see tomorrow.  Appreciate assistance 9/18 no behavioral related notes overnight, psychiatry plans to see him today.  Appreciate assistance 9/19 patient was seen by psychiatry yesterday, recommended continue Abilify, consider donepezil or memantine, suggested avoiding stimulants.  Will continue current regimen for now as it appears he is doing better.  He appears to be less sedated and did not see frequent notes of agitation but this regimen thus far. 9/20 recheck CMP/ammonia/VPA level today due to continued drowsiness  9/21- Depakote level 16- too low- Ammonia 19- low- asleep this AM, but haven't been called today about sedation/lethargy.  5. Neuropsych/cognition: This patient is not capable of making decisions on his own behalf   -recommend neuropsych consult   - 8/27: Ongoing aggitation; Consult hospitalist for further workup on potential contributors to encephalopathy. Pending UA, LA 2.1-> 1.4; otherwise negative so far -Delirium precautions, sleep log, Veiled bed for safety and to minimize restraints -Wife has expressed that she is not interested in palliative care at this time; hopefully with above measures patient can get dialysis in the next 48 hours to limit worsening uremic encephalopathy and medical decline    - 8/29: Aggitation much improved, able to get some dialysis last night and today. See above for medication changes. Calling wife tonight  to discuss.   8-30: Still with moderate cognitive deficits, however appears once patient is less agitated he is better able to concentrate and has better orientation.  MRI negative for acute findings, monitor.  9/3 agitation better. More sedated than anything at this point. Confused as well.    -depakote stopped   -continue other meds as rx'ed  9/21- pt refused Depakote this AM "since doesn't have seizures".  6. Skin/Wound Care: Routine skin care checks             -sacral skin off-loading/protection   7. Fluids/Electrolytes/Nutrition: Strict Is and Os and follow-up chemistries per nephrology             -renal diet/ 1200 cc FR  - Renvela 800 mg 3 times daily for hyperphosphatemia added per nephrology 8-27   9/21- Last Cr 7.07 and BUN 22 8: Hypotension: monitor TID and prn             -continue midodrine as needed during dialysis  Normotensive, monitor  9-1: Consistently 90s over 60s, has been stable but given findings of microvascular changes on MRI, may be too low for patient to perfuse properly.  Will add midodrine 5 mg tid and monitor.  DC Coreg as heart rate has been low to normal; may resume at low-dose once blood pressure improved.   9/2 continue with ccurrent regimen. Bp's hr remain low   -elavil wean should help  9/9-10 BP stable, continue midodrine and monitor  9/13 DC midodrine today as BP  has been higher, can restart if needed  Improved, cont to monitor  9/31- BP 140's this last 24 hours- con't to monitor trend     04/07/2023    8:47 AM 04/06/2023    8:08 PM 04/06/2023    1:20 PM  Vitals with BMI  Systolic 145 144 161  Diastolic 83 87 65  Pulse 70 73 71      9: Hyperlipidemia: continue statin   10: CAD s/p CABG 2023: on DAPT and statin.  - troponin elevated but stable, monitor 8/27   11: DM-2: A1c = 6.3% (at home on Lantus 22 units at bedtime, Tradjenta 5 mg daily)        -monitor PO intake             -continue SSI (0-6 units) does not appear to be requiring  coverage   - 9/6 DC SSI, change CBGs to daily  9/8 discontinue CBGs  9/16 glucose still controlled on BMP yesterday  Recheck on CMP today   9/21- BG 106 on labs No results for input(s): "GLUCAP" in the last 72 hours.    12: ESRD: HD on Tues, Thurs, Sat -nephrology made aware   - Unable to tolerate 8/27 d/t aggitation, stopped early 8/28;  - attempting again 8/29, stopped early again -Per nephrology, redirectable with gum -Have to switch restraints during dialysis sessions from veil bed to regular bed, then resume veil bed order once at rehab -9/9 tolerating HD better 9/18 patient's wife indicates she would be able to stay with him during dialysis after discharge   13: COPD/OSA; chronic home 2L O2 (Symbicort, Ventolin, prednisone 40 mg daily at home)             -continue symbicort  -Added home oxygen back 8-27   14: GERD: continue Protonix   15: Non-sustained VT/pAF/bradycardia: per cardiology, Dr. Algie Coffer: -continue carvedilol 6.25 mg BID; consider increasing to 12.5 mg BID before starting low dose amiodarone  -consider Eliquis and discontinue asa and Plavix in one week if no active GI bleeding and stable hemoglobin from 8/24 -hemoglobin has remained stable but very low around 7, no recurrent A-fib, monitor for now given agitation/fall 8-31 and consider initiating Eliquis when safe -Continue mag-ox to avoid hypomagnesemia -8-27: Single instance of bradycardia this morning 40s, may have been missed read, monitor through today and do not further adjust beta-blocker medications  -9-3 hr controlled after holding coreg -9/4-5 HR stable, continue to monitor -9/9 consider Eliquis this week if agitation continues to improve -9/10 will hold off on Eliquis due to sundowning at night, will see how he does with increase Geodon -9/11 EKG for bradycardia episode today, 2nd degree A-V block mobitz type 1.  Continue to hold beta blocker,9/11 will restart eliquis at 2.5 mg BID dose. Will consult  cardiology regarding need for ASA/plavix and coreg.  Dr. Elease Hashimoto does not appear convinced that he has A-fib 9/12 appreciate cardiology consult yesterday, DAPT transition to daily aspirin 81 only and Eliquis was discontinued, caution with Geodon as this can contribute to bradycardia 9/13 will decrease Geodon nighttime dose to 20 due to episodes of bradycardia 9/17 will discontinue Geodon due to variable cardiac effects 9/18-20 heart rate stable      04/07/2023    8:47 AM 04/06/2023    8:08 PM 04/06/2023    1:20 PM  Vitals with BMI  Systolic 145 144 096  Diastolic 83 87 65  Pulse 70 73 71       16: HFrEF:  Daily weights. Monitor for signs of overload  - 9/7 continue daily weights, fluid level management per hemodialysis team Weight reviewed and stable  9/21- 94.3- down 3 kg-  Filed Weights   04/05/23 1423 04/05/23 1815 04/05/23 1820  Weight: 97.1 kg 94.3 kg 94.3 kg      17: Liver nodularity on CT scan: LFTs reported to be normal. Avoid hepatotoxic medications    -Abdominal ultrasound earlier this admission with minimal ascites; ammonia within normal limits  -8/31: Did discuss with nephrology whether p.o. lactulose would be beneficial despite normal ammonia, after reviewing records unsure that this would be beneficial    18: Epistaxis - resolved             -previously improved with flonase and saline spray   19: Morbid Obesity. Dietary counseling Body mass index is 39.01 kg/m.  20. Bradycardic: continue to monitor HR TID  -Discontinue Geodon  -9/19 heart rate in the 80s, stable  21. HTN: resolved after HD, continue to monitor TID  9/19-20 intermittently elevated, continue to monitor.  Cautious with medication addition to avoid hypotension   9/21- BP 140 systolic- wait to add anything due to HD.      I spent a total of 35   minutes on total care today- >50% coordination of care- due to  Complex medical issues-   LOS: 26 days A FACE TO FACE EVALUATION WAS  PERFORMED  Marquon Alcala 04/07/2023, 11:46 AM

## 2023-04-07 NOTE — Progress Notes (Signed)
Pt anxiety and agitated in hemodialysis unit . Pt refused to do treatment.. Pt sent back to his room per Dr. Arlean Hopping ordered.

## 2023-04-07 NOTE — Progress Notes (Addendum)
[facility] Progress Note   Subjective: At the nursing station again but daughter came to bring him food from [employer]. Educated daughter that diet will be different now that he's doing HD opposed to PD. Discussed fluid restrictions, sodium restrictions and K+ restrictions.   HD later today.     Objective Vitals:   04/06/23 1320 04/06/23 2008 04/07/23 0847 04/07/23 0909  BP: 128/65 (!) 144/87 (!) 145/83   Pulse: 71 73 70   Resp: 20 19 19    Temp: 98.5 F (36.9 C) 98.4 F (36.9 C) 98.2 F (36.8 C)   TempSrc: Oral Oral    SpO2: 93% 99% 94% 100%  Weight:      Height:       Physical Exam General: Pleasantly confused elderly male in NAD Heart: S1,S2 RRR Lungs: CTAB A/P Abdomen: NABS NT Extremities: No LE edema Dialysis Access: L AVG + T/B    Additional Objective Labs: Basic Metabolic Panel: Recent Labs  Lab 03/31/23 1420 04/01/23 0816 04/06/23 1330  NA 134* 134* 135  K 2.9* 4.0 4.1  CL 94* 97* 93*  CO2 29 29 31   GLUCOSE 102* 105* 106*  BUN 12 37* 22  CREATININE 2.13* 8.09* 7.07*  CALCIUM 8.0* 8.7* 9.2  PHOS <1.0* 2.8  --    Liver Function Tests: Recent Labs  Lab 03/31/23 1420 04/01/23 0816 04/06/23 1330  AST  --   --  43*  ALT  --   --  34  ALKPHOS  --   --  86  BILITOT  --   --  1.0  PROT  --   --  7.0  ALBUMIN 2.4* 2.1* 2.6*   No results for input(s): "LIPASE", "AMYLASE" in the last 168 hours. CBC: Recent Labs  Lab 03/31/23 1420  WBC 3.6*  HGB 9.6*  HCT 32.8*  MCV 98.2  PLT 155   Blood Culture    Component Value Date/Time   SDES BLOOD RIGHT HAND 02/19/2023 1855   SPECREQUEST  02/19/2023 1855    BOTTLES DRAWN AEROBIC ONLY Blood Culture results may not be optimal due to an inadequate volume of blood received in culture bottles   CULT  02/19/2023 1855    NO GROWTH 5 DAYS Performed at [facility], [address], [place], [place] [address]    REPTSTATUS 02/24/2023 FINAL 02/19/2023 1855    Cardiac Enzymes: No  results for input(s): "CKTOTAL", "CKMB", "CKMBINDEX", "TROPONINI" in the last 168 hours. CBG: No results for input(s): "GLUCAP" in the last 168 hours. Iron Studies: No results for input(s): "IRON", "TIBC", "TRANSFERRIN", "FERRITIN" in the last 72 hours. @lablastinr3 @ Studies/Results: No results found. Medications:   (feeding supplement) PROSource Plus  30 mL Oral BID BM   ARIPiprazole  5 mg Oral QHS   aspirin EC  81 mg Oral Daily   atorvastatin  80 mg Oral Daily   darbepoetin (ARANESP) injection - DIALYSIS  150 mcg Subcutaneous Q Sun-1800   divalproex  125 mg Oral Q12H   feeding supplement  237 mL Oral BID WC   Gerhardt's butt cream  1 Application Topical QID   lidocaine  1 patch Transdermal Q2200   lidocaine  1 patch Transdermal Q24H   magnesium oxide  400 mg Oral Daily   mometasone-formoterol  2 puff Inhalation BID   pantoprazole  40 mg Oral BID   traZODone  50 mg Oral QHS     Dialysis Orders: Previously on [facility]CPD thru [facility] - changed to HD - will  need outpatient HD unit established prior to d/c. 4hr, 3K, EDW ~96, L AVF   Assessment/Plan: 1. Debility - in CIR 2. Encephalopathy/agitation: Waxing/waning agitation, dementia sundowning. Ammonia fine and brain MRI with no acute findings. He continues to be disruptive during HD. Needs sitter. But sitters are not allowed in the hospital HD unit. Have d/w HD staff - they state that most if not all of his recent HD sessions have been problematic due to resistance and aggressive behavior. We have been using IV ativan to assist w/ getting him through dialysis. We can't really be giving this patient IV Ativan / sedation for routine dialysis treatments. Today when we brought him up he has been refusing and agitated and we were not able to dialyze him at all. Will send back to his room. Will notify the primary team.  3. ESRD: Transitioned from PD to HD recently on TTS schedule. Using old AVF which is still functional, s/p f'gram  02/28/23. Next HD 04/07/2023 4. Hypotension/volume: BP higher, midodrine 5mg  TID stopped 9/13. UF as tolerated.   5. Anemia: Hgb 9.6 -  on Aranesp weekly. Last Tsat 38% with ferritin 2175.  No iron.  6. Metabolic bone disease: CorrCa ok, Phos low end on repeat - holding binders (renvela), no VDRA. 7.  Nutrition:  Alb very low, continue supps.  Renal diet w/fluid restrictions.  8.  Hx NSVT: On Coreg 10.  Recent culture negative PD cath-related peritonitis and associated fungemia + enterococcus bacteremia - underwent PD cath removal, TDC placement and removal, temp HD cath placement  and removal, and line holiday. Completed IV unasyn, IV micafungin and cipro on 8/15. TDC not replaced cause he had functional AVF.   11. Dispo: For SNF placement. Regarding outpatient dialysis placement- per renal navigator he will need new referral since he has been gone from his prior clinic so long.     Rita H. Brown NP-C 04/07/2023, 11:50 AM  [facility] [phone]  Pt seen, examined and agree w assess/plan as above with additions as indicated.  Rob [facility] 04/07/2023, 12:43 PM

## 2023-04-07 NOTE — Progress Notes (Signed)
Speech Language Pathology Daily Session Note  Patient Details  Name: [name] [name] MRN: [id number] Date of Birth: Jul 16, 1951  Today's Date: 04/07/2023 SLP Individual Time: 0700-0735 SLP Individual Time Calculation (min): 35 min  Short Term Goals: Week 4: SLP Short Term Goal 1 (Week 4): Patient will demonstrate intellectual awareness of deficits via verbally stating 2 cognitive and 2 physical impairments given modA SLP Short Term Goal 2 (Week 4): Patient will demonstrate use of recommended safe swallow strategies during mealtimes with modA. SLP Short Term Goal 3 (Week 4): Patient will utilize external aids to orient to time and situation given modA SLP Short Term Goal 4 (Week 4): Patient will participate in therapy sessions in 80% of opportunities given modA multimodal cues.  Skilled Therapeutic Interventions:   Pt was seen for skilled ST services with attempt to target cognition and dysphagia. Pt was asleep upon SLP entry. SLP attempted environmental modifications to rouse patient re: changing lighting, repositioning. Pt was asked orientation questions, but could not give a direct answer. With any pauses (where pt was required to think/answer), he fell back asleep. SLP tried to set up pt with breakfast to see if this would be helpful. He did take a few sips of coffee with hand under hand support from SLP; however, immediately fell back asleep. SLP discontinued session as pt was not appropriate to continue due to levels of fatigue.   Continue with current ST POC.   Pain Pain Assessment Pain Score: Asleep (Did not appear to be in pain.)  Therapy/Group: Individual Therapy  Ann Lions 04/07/2023, 7:44 AM

## 2023-04-07 NOTE — Progress Notes (Signed)
Pt agitated upon arrival from dialysis. Pt refused dialysis tx today. Posey belt in place and on. Bed alarm on middle setting, floor mats in place and call light in reach. Tele sitter has visual on pt. Pt provided blanket and is resting with eyes closed at this time. Pt spouse notified of dialysis refusal. Mylo Red, LPN

## 2023-04-07 NOTE — Progress Notes (Signed)
Pt having aggressive behavior towards staff. Pt grabbing at staff when staff attempting to adjust posey belt. Pt refusing to take medications and is yelling and non compliant with safety measures. IM ativan administered.

## 2023-04-08 ENCOUNTER — Encounter (HOSPITAL_COMMUNITY): Payer: Self-pay | Admitting: Physical Medicine & Rehabilitation

## 2023-04-08 DIAGNOSIS — G9341 Metabolic encephalopathy: Secondary | ICD-10-CM | POA: Diagnosis not present

## 2023-04-08 DIAGNOSIS — Z7189 Other specified counseling: Secondary | ICD-10-CM

## 2023-04-08 DIAGNOSIS — Z515 Encounter for palliative care: Secondary | ICD-10-CM

## 2023-04-08 LAB — CBC WITH DIFFERENTIAL/PLATELET
Abs Immature Granulocytes: 0.02 10*3/uL (ref 0.00–0.07)
Basophils Absolute: 0.1 10*3/uL (ref 0.0–0.1)
Basophils Relative: 1 %
Eosinophils Absolute: 0.1 10*3/uL (ref 0.0–0.5)
Eosinophils Relative: 2 %
HCT: 31.5 % — ABNORMAL LOW (ref 39.0–52.0)
Hemoglobin: 9.4 g/dL — ABNORMAL LOW (ref 13.0–17.0)
Immature Granulocytes: 1 %
Lymphocytes Relative: 14 %
Lymphs Abs: 0.6 10*3/uL — ABNORMAL LOW (ref 0.7–4.0)
MCH: 27.4 pg (ref 26.0–34.0)
MCHC: 29.8 g/dL — ABNORMAL LOW (ref 30.0–36.0)
MCV: 91.8 fL (ref 80.0–100.0)
Monocytes Absolute: 0.7 10*3/uL (ref 0.1–1.0)
Monocytes Relative: 16 %
Neutro Abs: 2.9 10*3/uL (ref 1.7–7.7)
Neutrophils Relative %: 66 %
Platelets: 169 10*3/uL (ref 150–400)
RBC: 3.43 MIL/uL — ABNORMAL LOW (ref 4.22–5.81)
RDW: 15.9 % — ABNORMAL HIGH (ref 11.5–15.5)
WBC: 4.4 10*3/uL (ref 4.0–10.5)
nRBC: 0 % (ref 0.0–0.2)

## 2023-04-08 LAB — URINALYSIS, MICROSCOPIC (REFLEX)
RBC / HPF: >50 RBC/hpf (ref 0–5)
Squamous Epithelial / HPF: NONE SEEN /HPF (ref 0–5)

## 2023-04-08 LAB — RENAL FUNCTION PANEL
Albumin: 2.5 g/dL — ABNORMAL LOW (ref 3.5–5.0)
Anion gap: 17 — ABNORMAL HIGH (ref 5–15)
BUN: 44 mg/dL — ABNORMAL HIGH (ref 8–23)
CO2: 26 mmol/L (ref 22–32)
Calcium: 9 mg/dL (ref 8.9–10.3)
Chloride: 96 mmol/L — ABNORMAL LOW (ref 98–111)
Creatinine, Ser: 10.93 mg/dL — ABNORMAL HIGH (ref 0.61–1.24)
GFR, Estimated: 5 mL/min — ABNORMAL LOW (ref >60)
Glucose, Bld: 105 mg/dL — ABNORMAL HIGH (ref 70–99)
Phosphorus: 4.5 mg/dL (ref 2.5–4.6)
Potassium: 3.9 mmol/L (ref 3.5–5.1)
Sodium: 139 mmol/L (ref 135–145)

## 2023-04-08 LAB — URINALYSIS, ROUTINE W REFLEX MICROSCOPIC: Glucose, UA: NEGATIVE mg/dL

## 2023-04-08 MED ORDER — LORAZEPAM 0.5 MG PO TABS: 0.5000 mg | ORAL_TABLET | Freq: Four times a day (QID) | ORAL | Status: DC | PRN

## 2023-04-08 MED ORDER — LORAZEPAM 2 MG/ML IJ SOLN
0.5000 mg | Freq: Four times a day (QID) | INTRAMUSCULAR | Status: DC | PRN
Start: 2023-04-08 — End: 2023-04-08

## 2023-04-08 MED ORDER — LORAZEPAM 2 MG/ML IJ SOLN
0.5000 mg | Freq: Four times a day (QID) | INTRAMUSCULAR | Status: DC | PRN
  Administered 2023-04-17: 0.5 mg via INTRAMUSCULAR
  Filled 2023-04-08 (×4): qty 1

## 2023-04-08 MED ORDER — LORAZEPAM 0.5 MG PO TABS
0.5000 mg | ORAL_TABLET | Freq: Four times a day (QID) | ORAL | Status: DC | PRN
  Administered 2023-04-09 – 2023-04-18 (×18): 0.5 mg via ORAL
  Filled 2023-04-08 (×20): qty 1

## 2023-04-08 MED ORDER — LORAZEPAM 2 MG/ML IJ SOLN
0.5000 mg | Freq: Four times a day (QID) | INTRAMUSCULAR | Status: DC | PRN
  Administered 2023-04-08: 1 mg via INTRAMUSCULAR
  Filled 2023-04-08: qty 1

## 2023-04-08 NOTE — Progress Notes (Signed)
Pt becoming Spouse updated on pt agitation and plan of care. Wife planning to visit today. Pt urine specimen obtained. Ativan given Mylo Red, LPN

## 2023-04-08 NOTE — Progress Notes (Addendum)
[facility] Progress Note   Subjective: Too combative for HD 04/07/2023. Agitation appears to be made worse with cannulation of AVF. Will order TDC to be placed in IR.   Patient in bed at present. No family members present. Will need to discuss [facility] with family when they arrive however seen in interim by IR who found patient too agitated for [facility] placement and declines to proceed.    Objective Vitals:   04/07/23 0909 04/07/23 1439 04/08/23 0609 04/08/23 0747  BP:  (!) 140/79 (!) 155/76   Pulse:  66 82 78  Resp:  (!) 22 20 18   Temp:  98.4 F (36.9 C) 97.8 F (36.6 C)   TempSrc:      SpO2: 100% (!) 86% 94% 96%  Weight:  90.7 kg    Height:       Physical Exam General: confused elderly male in NAD Heart: S1,S2 RRR Lungs: CTAB A/P Abdomen: NABS NT Extremities: No LE edema Dialysis Access: L AVG + T/B  Additional Objective Labs: Basic Metabolic Panel: Recent Labs  Lab 04/06/23 1330  NA 135  K 4.1  CL 93*  CO2 31  GLUCOSE 106*  BUN 22  CREATININE 7.07*  CALCIUM 9.2   Liver Function Tests: Recent Labs  Lab 04/06/23 1330  AST 43*  ALT 34  ALKPHOS 86  BILITOT 1.0  PROT 7.0  ALBUMIN 2.6*   No results for input(s): "LIPASE", "AMYLASE" in the last 168 hours. CBC: No results for input(s): "WBC", "NEUTROABS", "HGB", "HCT", "MCV", "PLT" in the last 168 hours. Blood Culture    Component Value Date/Time   SDES BLOOD RIGHT HAND 02/19/2023 1855   SPECREQUEST  02/19/2023 1855    BOTTLES DRAWN AEROBIC ONLY Blood Culture results may not be optimal due to an inadequate volume of blood received in culture bottles   CULT  02/19/2023 1855    NO GROWTH 5 DAYS Performed at [facility], [address] [place], [place] [address]    REPTSTATUS 02/24/2023 FINAL 02/19/2023 1855    Cardiac Enzymes: No results for input(s): "CKTOTAL", "CKMB", "CKMBINDEX", "TROPONINI" in the last 168 hours. CBG: No results for input(s): "GLUCAP" in the last 168  hours. Iron Studies: No results for input(s): "IRON", "TIBC", "TRANSFERRIN", "FERRITIN" in the last 72 hours. @lablastinr3 @ Studies/Results: No results found. Medications:   (feeding supplement) PROSource Plus  30 mL Oral BID BM   ARIPiprazole  5 mg Oral QHS   aspirin EC  81 mg Oral Daily   atorvastatin  80 mg Oral Daily   darbepoetin (ARANESP) injection - DIALYSIS  150 mcg Subcutaneous Q Sun-1800   divalproex  125 mg Oral Q12H   feeding supplement  237 mL Oral BID WC   Gerhardt's butt cream  1 Application Topical QID   lidocaine  1 patch Transdermal Q2200   lidocaine  1 patch Transdermal Q24H   LORazepam  1-2 mg Intravenous Once in dialysis   magnesium oxide  400 mg Oral Daily   mometasone-formoterol  2 puff Inhalation BID   pantoprazole  40 mg Oral BID   traZODone  50 mg Oral QHS     Dialysis Orders: Previously on CCPD thru [facility] - changed to HD - will need outpatient HD unit established prior to d/c. 4hr, 3K, EDW ~96, L AVF   Assessment/Plan: 1. Debility - in CIR 2. Encephalopathy/agitation: Waxing/waning agitation, dementia sundowning. Ammonia fine and brain MRI with no acute findings. He continues to be disruptive during HD. Needs  sitter. But sitters are not allowed in the hospital HD unit. Have d/w HD staff - they state that most if not all of his recent HD sessions have been problematic due to resistance and aggressive behavior. We have been using IV ativan to assist w/ getting him through dialysis. Use of IV Ativan can not be done in OP HD units.  04/07/2023 when we brought him up he has been refusing and agitated and we were not able to dialyze him at all. Agitation appears to be made worse by cannulation of AVF. Placed order for [facility] to be placed in IR however IR declines to proceed with [facility] D/T patient agitation. For now, will order HD for 04/09/2023 and increase IV ativan dose. Will ask Palliative Care to see pt as further discussions are warranted as to whether  this patient remains appropriate for hemodialysis. At present he would not be appropriate for OP HD even with sitter present due to increased agitation.  3. ESRD: Transitioned from PD to HD recently on TTS schedule. Using old AVF which is still functional, s/p f'gram 02/28/23. Next HD 04/09/2023 4.Hypertension/volume: BP higher, midodrine 5mg  TID stopped 9/13. UF as tolerated. Agitation probably playing a role in hypertension.  5. Anemia: No recent labs. Draw in HD tomorrow.-  on Aranesp weekly. Last Tsat 38% with ferritin 2175.  No iron.  6. Metabolic bone disease: CorrCa ok, Phos low end on repeat - holding binders (renvela), no VDRA. 7.  Nutrition:  Alb very low, continue supps.  Renal diet w/fluid restrictions.  8.  Hx NSVT: On Coreg 10.  Recent culture negative PD cath-related peritonitis and associated fungemia + enterococcus bacteremia - underwent PD cath removal, TDC placement and removal, temp HD cath placement  and removal, and line holiday. Completed IV unasyn, IV micafungin and cipro on 8/15. TDC not replaced cause he had functional AVF.   11. Dispo: For SNF placement. Regarding outpatient dialysis placement- per renal navigator he will need new referral since he has been gone from his prior clinic so long.   Bryndle Corredor H. Rondell Frick NP-C 04/08/2023, 10:21 AM  [employer] [phone]

## 2023-04-08 NOTE — Progress Notes (Signed)
MD lovorn notified of pt increased confusion and hallucinations.  New orders recieved.

## 2023-04-08 NOTE — Progress Notes (Signed)
Patient slept at least 2-3hr last night. Patient alert and oriented to self. Continued to try to get out of bed multiple times, not redirectable. Patient currently in bed with call bell within reach, telesitter on and bed alarm on.

## 2023-04-08 NOTE — Consult Note (Signed)
Palliative Care Consult Note                                  Date: 04/08/2023   Patient Name: [name] [name]  DOB: 1951/02/07  MRN: [id number]  Age / Sex: 72 y.o., male  PCP: Benetta Spar, MD Referring Physician: Fanny Dance, MD  Reason for Consultation: Establishing goals of care  HPI/Patient Profile: 72 y.o. male  with past medical history of  CAD, PUD, DM, OAS, HLD, ESRD who was on PD which was removed on 02/19/23, LUE AVF in place, IR was consulted for image guided tunneled HD cath placement.    Patient was admitted to [facility] on 02/15/23 after presenting to [facility] ED with acute metabolic encephalopathy assumed due to recent Covid infection, PD catheter malfunctioning, later found to have sepsis secondary to peritoneal dialysis associated catheter peritonitis and polymicrobial bacteremia/fungemia, s/p PD removal on 02/19/23. Hospital course complicated by worsening encephalopathy with hypoxemia.    Patient had LUE AVF in place, he underwent fistulogram with balloon venoplasty on 8/14 by Dr. Karin Lieu, the fistula was used for HD treatment on 8/16. Patient was discharged to [facility] on 8/16, he was admitted to CIR on 03/12/23. In CIR, patient continued to be confused and agitated, psychiatry is following. Patient has been disruptive during HD at times, per nephrology note he was too combative for HD on 04/07/23, agitation appears to be made worse with cannulation of AVF.    Past Medical History:  Diagnosis Date   Anemia    Arthritis    Asthma    Bell palsy    CAD (coronary artery disease)    a. 2014 MV: abnl w/ infap ischemia; b. 03/2013 Cath: aneurysmal bleb in the LAD w/ otw nonobs dzs-->Med Rx.   Chronic back pain    Chronic knee pain    a. 09/2015 s/p R TKA.   Chronic pain    Chronic shoulder pain    Chronic sinusitis    COPD (chronic obstructive pulmonary disease) (HCC)    Diabetes mellitus without complication (HCC)    type II     ESRD on peritoneal dialysis (HCC)    on peritoneal dialysis, [facility]   Essential hypertension    GERD (gastroesophageal reflux disease)    Gout    Gout    Hepatomegaly    noted on noncontrast CT 2015   History of hiatal hernia    Hyperlipidemia    Lateral meniscus tear    Obesity    Truncal   Obstructive sleep apnea    does not use cpap    On home oxygen therapy    uses 2l when is going somewhere per patient    PUD (peptic ulcer disease)    remote, reports f/u EGD about 8 years ago unremarkable    Reactive airway disease    related to exposure to chemical during 9/11   Sinusitis    Vitamin D deficiency     Subjective:   I have reviewed medical records including EPIC notes, labs and imaging, received update from nephrology, assessed the patient and then met in the patient's room with his wife [name] [name] to discuss diagnosis prognosis, GOC, EOL wishes, disposition and options. The patient was unable to participate. Dr. Arlean Hopping was present.  I introduced Palliative Medicine as specialized medical care for people living with serious illness. It focuses on providing relief from symptoms and stress  of a serious illness. The goal is to improve quality of life for both the patient and the family.  Patient/Family Understanding of Illness: [name] [name] has a very good understanding of the patient's chronic and acute illness. She detailed his last two years which have been difficult due to multiple Covid diagnoses, CABG, and this hospitalization. Patient has had delirium in past hospitalizations which took some time to resolve. His wife states he only had one clear day since 02/14/23.  Social History: Patient has been married to his wife [name] [name] for 52 years. They have children, grandchildren, and great grandchildren. The patient's wife describes the patient as fun. Before the hospitalization the patient enjoyed spending time with family and watching westerns and [place] on the  television.  Functional and Nutritional State: Prior to this admission the patient was independent but in cardiovascular rehab. He managed his PD and medications independently. He was very committed to his dialysis.  Today's Discussion: Dr. Arlean Hopping began the discussion by reviewing his understanding of the patient's current hospitalization including the difficulty in him receiving dialysis due to his altered mental status. [name] [name] shared that she understands her husband cannot survive without dialysis. She also shares that he has been very committed to his dialysis. Initially when he was on HD he had a hard time with the needles involved and was able to transition to PD at home for 5 years prior to this admission.Earlier in this hospitalization the patient was not aggressive while receiving CRRT through a catheter. VIR did not approve the placement of a tunneled HD catheter but Dr. Arlean Hopping is going to explore other options.   We did discuss what end of life might look like for this patient if he were unable to continue dialysis. His wife states she knows it is in God's hands and she is hopeful but also realistic.  We discussed advanced care planning and they have completed HCPOA and Living Wills which they gave to their PCP. I offered to scan them into the system if [name] [name] wants to bring them in.  Recommended consideration of DNR status, understanding evidenced-based poor outcomes in similar hospitalized patients, as the cause of the arrest is likely associated with chronic/terminal disease rather than a reversible acute cardio-pulmonary event. [name] [name] states the patient would want to be DNR-- code status changed. We discussed scope of care and the patient would want to be limited scope of care and would not want to be intubated-- scope of care changed to limited.  Discussed the importance of continued conversation with family and the medical providers regarding overall plan of care and treatment options,  ensuring decisions are within the context of the patient's values and GOCs.  Questions and concerns were addressed. Hard Choices booklet left for review. The family was encouraged to call with questions or concerns. PMT will continue to support holistically.  Review of Systems  Unable to perform ROS   Objective:   Primary Diagnoses: Present on Admission:  Acute metabolic encephalopathy   Physical Exam Vitals reviewed.  Constitutional:      General: He is sleeping.  HENT:     Head: Normocephalic and atraumatic.  Cardiovascular:     Rate and Rhythm: Normal rate.  Pulmonary:     Effort: Pulmonary effort is normal.  Neurological:     Mental Status: He is easily aroused.     Vital Signs:  BP (!) 145/70 (BP Location: Right Arm)   Pulse 80   Temp 97.7 F (36.5 C) (Oral)   Resp  18   Ht 5\' 4"  (1.626 m)   Wt 90.7 kg   SpO2 99%   BMI 34.32 kg/m     Advanced Care Planning:   Existing Vynca/ACP Documentation: None  Primary Decision Maker: Patient's wife [name] [name] is the decision maker while the patient cannot make decisions.  Code Status/Advance Care Planning: DNR  Assessment & Plan:   SUMMARY OF RECOMMENDATIONS   Change to DNR Limited scope of treatment- no intubation Time for outcomes-- hoping to find a way to continue dialysis Spiritual care consult- for supporting patient's wife Delirium precautions discussed with wife Continued PMT support  Discussed with: bedside RN, Dr. Arlean Hopping, Alonna Buckler NP  Time Total: 75 minutes   Thank you for allowing [place] to participate in the care of [name] [name] PMT will continue to support holistically.   Signed by: Sarina Ser, NP Palliative Medicine Team  Team Phone # [phone] (Nights/Weekends)  04/08/2023, 4:28 PM

## 2023-04-08 NOTE — Progress Notes (Signed)
PROGRESS NOTE   Subjective/Complaints:  Pt didn't get HD yesterday- refused and became to agitated to occur.   Per nursing having a LOT of hallucinations and more confused today-   Keeps telling nursing that he needs to pee- maybe bladder spasms or UTI? Usually anuric, however nursing reports that dribbled urine from penis- and was very dark/cloudy color-   Still refusing most of meds.  Required Ativan- gave 1mg  IM because so agitated after cath - very little urine/hematuria -sent for U/A and Cx.   Refused and got agitated about labs- not able to get them- will try once Wife gets here?  [id number] due to cognition/behavioral   Objective:   No results found. No results for input(s): "WBC", "HGB", "HCT", "PLT" in the last 72 hours.    Recent Labs    04/06/23 1330  NA 135  K 4.1  CL 93*  CO2 31  GLUCOSE 106*  BUN 22  CREATININE 7.07*  CALCIUM 9.2       Intake/Output Summary (Last 24 hours) at 04/08/2023 1401 Last data filed at 04/08/2023 1030 Gross per 24 hour  Intake 117 ml  Output 0 ml  Net 117 ml          Physical Exam: Vital Signs Blood pressure (!) 155/76, pulse 78, temperature 97.8 F (36.6 C), resp. rate 18, height 5\' 4"  (1.626 m), weight 90.7 kg, SpO2 96%.     General: initially asleep- 2nd visit- agitated; in bed;   HENT: conjugate gaze; oropharynx moist CV: regular rate; no JVD Pulmonary: CTA B/L; no W/R/R- good air movement GI: soft, NT, ND, (+)BS Psychiatric: inappropriate, confused Neurological: sleeping this AM- agitated when seen this Afternoon= pulling at bedding- loud  Ext: no clubbing, cyanosis, or edema Psych: Confused, not agitated at this time Neurologic Exam:   Appears drowsy this AM.  Oriented to self, rehabilitation center + Memory deficit - ongoing  Sensory exam: Intact to touch in all 4 extremities Motor exam: moves all 4 extremities without  limitiations Coordination: Slight bilateral upper extremity tremor, intention, mild.    Musculoskeletal: No joint swelling noted No abnormal tone      Assessment/Plan: 1. Functional deficits which require 15 hr per week of interdisciplinary therapy in a comprehensive inpatient rehab setting. Physiatrist is providing close team supervision and 24 hour management of active medical problems listed below. Physiatrist and rehab team continue to assess barriers to discharge/monitor patient progress toward functional and medical goals  Care Tool:  Bathing    Body parts bathed by patient: Right arm, Left arm, Chest, Abdomen, Front perineal area, Right upper leg, Buttocks, Left upper leg, Right lower leg, Left lower leg, Face         Bathing assist Assist Level: Moderate Assistance - Patient 50 - 74%     Upper Body Dressing/Undressing Upper body dressing   What is the patient wearing?: Pull over shirt    Upper body assist Assist Level: Minimal Assistance - Patient > 75%    Lower Body Dressing/Undressing      Lower body dressing      What is the patient wearing?: Underwear/pull up, Pants     Lower body assist Assist  for lower body dressing: Maximal Assistance - Patient 25 - 49%     Toileting Toileting    Toileting assist Assist for toileting: Maximal Assistance - Patient 25 - 49%     Transfers Chair/bed transfer  Transfers assist     Chair/bed transfer assist level: Maximal Assistance - Patient 25 - 49%     Locomotion Ambulation   Ambulation assist      Assist level: Minimal Assistance - Patient > 75% Assistive device: No Device Max distance: 175'   Walk 10 feet activity   Assist     Assist level: Contact Guard/Touching assist Assistive device: No Device   Walk 50 feet activity   Assist Walk 50 feet with 2 turns activity did not occur: Safety/medical concerns  Assist level: Minimal Assistance - Patient > 75% Assistive device: No Device     Walk 150 feet activity   Assist Walk 150 feet activity did not occur: Safety/medical concerns  Assist level: Minimal Assistance - Patient > 75% Assistive device: No Device    Walk 10 feet on uneven surface  activity   Assist Walk 10 feet on uneven surfaces activity did not occur: Safety/medical concerns         Wheelchair     Assist Is the patient using a wheelchair?: Yes Type of Wheelchair: Manual    Wheelchair assist level: Dependent - Patient 0%      Wheelchair 50 feet with 2 turns activity    Assist        Assist Level: Dependent - Patient 0%   Wheelchair 150 feet activity     Assist      Assist Level: Dependent - Patient 0%   Blood pressure (!) 155/76, pulse 78, temperature 97.8 F (36.6 C), resp. rate 18, height 5\' 4"  (1.626 m), weight 90.7 kg, SpO2 96%.  Medical Problem List and Plan: 1. Functional deficits secondary to metabolic encephalopathy due to sepsis secondary to catheter peritonitis and hypoxemia              -patient may not shower             -ELOS/Goals: 16-18 days, PT/OT Sup, SLP min A -tentative discharge date 9-9           -Continue CIR therapies including PT, OT, and SLP   -Will review records from [facility] -- has worsened since approximately 8/22, possible 8/19, consistent with records from nursing at [facility] with current behavior.    -Enclosure has been discontinued   -Therapy QD schedule  -Therapy suggest having patient set up for short time before his therapy sessions to help him wake up.  I think this would be a good idea  Con't CIR PT, OT and SLP  Pt is full code- will defer this to primary physician/weekday team- esp since pt refusing Hemodialysis.  2.  Antithrombotics: -DVT/anticoagulation:  Pharmaceutical: Heparin, 03/23/23 changed to Lovenox yesterday to help improved compliance             -antiplatelet therapy: aspirin and Plavix   3. Pain Management: Tylenol as needed             -Lidoderm patches to  back  -Elavil 25 mg at bedtime for nerve pain and sleep 8/27  -8/29: Pain well controlled on Elavil; DC lyrica, PRN Tylenol/oxy 2.5-5 mg d/t CKD  8-30: Appears patient has been getting pain medications together overnight along with antipsychotics, will discuss with nursing  8-31: Patient having an difficulty endorsing needs and not utilizing  PRNs, scheduled oxycodone 5 mg 3 times daily and has 2.5 mg prn 3 times daily available.   9-1: Not utilizing PRNs, minimal complaints of pain, will discontinue scheduled given lethargy today..  -muscle rub R shoulder  9/16 patient reports pain is under control overall   9/21- denies pain 4. Mood/Behavior/Sleep: LCSW to evaluate and provide emotional support             -continue Seroquel 50 mg TID             -clonazepam 0.5 mg TID prn             -Geodon 10 mg IM q 12 hours prn             -antipsychotic agents: n/a  -8-27: Agitated by soft restraints in bed, transition to 4 side rails, Posey alarm, and TeleSitter for now.  Veil bed on standby if needed, however feel we should avoid this if possible due to likelihood of agitating patient further   - 8/28: Placed in veil bed; wrist restraints for dialysis. Scheduled Geodon 20 mg twice daily; discontinue Seroquel and clonazepam due to no perceivable benefit. Add as needed Zyprexa 5 mg twice daily p.o. or IM due to recent cardiac arrhythmias and concern for QTc prolongation. As needed ramelteon for sleep. Stop gabapentin due to possible side effect of agitation  - 8/28: No improvement except temporarily with Geodon; add depakote 125 mg q8h for ongoing aggitation, mood stabilization. Increase elavil to 50 mg at bedtime.  Workup ongoing.    - 8/29: EEG negative, labs normal. MRI pending. Sleeping well and calm since starting depakote and increase in elavil. Will monitor tonight and wean Geodon in AM if appropriate.   8-30: MRI showing chronic microvascular changes, otherwise normal.  Increasingly calm and  appropriate during the day, with ongoing agitation at nighttime.  Will increase nighttime Geodon to 40 mg; will discuss sleep log with nursing, as it has not been performed.  8-31 no sleep overnight per sleep log, again getting agitated around 9 PM and 5 AM.  Not responsive to as needed Zyprexa, responsive to Ativan.  Attempted to wean off scheduled a.m. Geodon today, but patient became extremely agitated and unable to redirect, calm down with readministration of medication.  Resume Geodon 20 mg every morning/40 mg every afternoon, increased Elavil to 75 mg nightly, adding trazodone 50 mg nightly as needed.  P.o./IV Ativan 0.5 mg every 6 hours for agitation.  9-1: slept well overnight with trazodone and increased Elavil, no PRNs used.  Unfortunately, oversedated today.  Discontinue scheduled pain medications as above, start wean of Elavil to 50 mg x2 days -> 25 mg x2 days -> 10 mg x2 days, off due to no apparent benefit and no complaints of pain.  Reduce Geodon from 20 mg every morning/40 mg every evening to 20 mg twice daily.  Continue trazodone as needed for sleep, as this seems to be the difference for a restful night yesterday.  If he remains overly sedated, would dose reduce and DC Depakote next.  Keep Ativan as needed d/t dialysis not having other options available.  9/2-3--non agitated but still sleepy   -dc'ed depakote as I doubt it's doing much at this dose anyway   -continue geodon at 20mg  bid   -scheduled trazodone at HS with back up dose if needed    -sleep was intermittent last night   -requires enclosure bed for safety  9/4 continue enclosure bed for now, consider trying DC  of this device early next week  -9/5 appears to be doing better overall this morning, continues to have intermittent agitation at times.  Continue enclosure bed  -9/6 agitation appears to be improving, patient is skeptical about medications and has been declining some of these.  Will discontinue multivitamin, RisaQuad to  try to decrease pill burden  -9/7 continues to have intermittent episodes of agitation however overall improving, appears to be taking most of his medications  9/9 agitation and mental status appears to be improving -discussing discontinuation of enclosure bed with team 9/10 Geodon dose increased to 40 mg at night, continue 20 mg in the morning 9/11 No acute events noted, will check with team regarding DC enclosure bed, nursing reports he did ok last night but had dialysis 9/12 continues to have sundowning/waxing waning confusion and agitation, continue enclosure bed for now, will caution with Geodon will continue current dose but hold off on any escalation as this can contribute to bradycardia 9/16 no longer using enclosure bed, continue Geodon 20 mg twice daily for now due to intermittent agitation 9/17 patient with agitation and confusion at night, has sedation during the day.  Discussed with psychiatry Dr. Gasper Sells.  Will discontinue Geodon and try Abilify tonight.  Dr. Gasper Sells also suggested considering donepezil to shorten hallucinations of delirium.  Psychiatry consult placed, will see tomorrow.  Appreciate assistance 9/18 no behavioral related notes overnight, psychiatry plans to see him today.  Appreciate assistance 9/19 patient was seen by psychiatry yesterday, recommended continue Abilify, consider donepezil or memantine, suggested avoiding stimulants.  Will continue current regimen for now as it appears he is doing better.  He appears to be less sedated and did not see frequent notes of agitation but this regimen thus far. 9/20 recheck CMP/ammonia/VPA level today due to continued drowsiness  9/21- Depakote level 16- too low- Ammonia 19- low- asleep this AM, but haven't been called today about sedation/lethargy.  9/22- more hallucinations per nursing- labs attempted- couldn't get- couldn't get U/A results since so much hematuria. Trying to get labs, to see if getting ill vs missing  HD? 5. Neuropsych/cognition: This patient is not capable of making decisions on his own behalf   -recommend neuropsych consult   - 8/27: Ongoing aggitation; Consult hospitalist for further workup on potential contributors to encephalopathy. Pending UA, LA 2.1-> 1.4; otherwise negative so far -Delirium precautions, sleep log, Veiled bed for safety and to minimize restraints -Wife has expressed that she is not interested in palliative care at this time; hopefully with above measures patient can get dialysis in the next 48 hours to limit worsening uremic encephalopathy and medical decline    - 8/29: Aggitation much improved, able to get some dialysis last night and today. See above for medication changes. Calling wife tonight to discuss.   8-30: Still with moderate cognitive deficits, however appears once patient is less agitated he is better able to concentrate and has better orientation.  MRI negative for acute findings, monitor.  9/3 agitation better. More sedated than anything at this point. Confused as well.    -depakote stopped   -continue other meds as rx'ed  9/21- pt refused Depakote this AM "since doesn't have seizures".   9/22- per nursing, attempted 3 different times yesterday and today to give Depakote 6. Skin/Wound Care: Routine skin care checks             -sacral skin off-loading/protection   7. Fluids/Electrolytes/Nutrition: Strict Is and Os and follow-up chemistries per nephrology             -  renal diet/ 1200 cc FR  - Renvela 800 mg 3 times daily for hyperphosphatemia added per nephrology 8-27   9/21- Last Cr 7.07 and BUN 22 8: Hypotension: monitor TID and prn             -continue midodrine as needed during dialysis  Normotensive, monitor  9-1: Consistently 90s over 60s, has been stable but given findings of microvascular changes on MRI, may be too low for patient to perfuse properly.  Will add midodrine 5 mg tid and monitor.  DC Coreg as heart rate has been low to normal; may  resume at low-dose once blood pressure improved.   9/2 continue with ccurrent regimen. Bp's hr remain low   -elavil wean should help  9/9-10 BP stable, continue midodrine and monitor  9/13 DC midodrine today as BP has been higher, can restart if needed  Improved, cont to monitor  9/21- BP 140's this last 24 hours- con't to monitor trend  9/22- BP 150's systolic- will monitor trend    04/08/2023    7:47 AM 04/08/2023    6:09 AM 04/07/2023    3:00 PM  Vitals with BMI  Systolic  155 --  Diastolic  76 --  Pulse 78 82       9: Hyperlipidemia: continue statin   10: CAD s/p CABG 2023: on DAPT and statin.  - troponin elevated but stable, monitor 8/27   11: DM-2: A1c = 6.3% (at home on Lantus 22 units at bedtime, Tradjenta 5 mg daily)        -monitor PO intake             -continue SSI (0-6 units) does not appear to be requiring coverage   - 9/6 DC SSI, change CBGs to daily  9/8 discontinue CBGs  9/16 glucose still controlled on BMP yesterday  Recheck on CMP today   9/21- BG 106 on labs No results for input(s): "GLUCAP" in the last 72 hours.    12: ESRD: HD on Tues, Thurs, Sat -nephrology made aware   - Unable to tolerate 8/27 d/t aggitation, stopped early 8/28;  - attempting again 8/29, stopped early again -Per nephrology, redirectable with gum -Have to switch restraints during dialysis sessions from veil bed to regular bed, then resume veil bed order once at rehab -9/9 tolerating HD better 9/18 patient's wife indicates she would be able to stay with him during dialysis after discharge  q9/22- pt refused/too agitated ot get HD yesterday 13: COPD/OSA; chronic home 2L O2 (Symbicort, Ventolin, prednisone 40 mg daily at home)             -continue symbicort  -Added home oxygen back 8-27   14: GERD: continue Protonix   15: Non-sustained VT/pAF/bradycardia: per cardiology, Dr. Algie Coffer: -continue carvedilol 6.25 mg BID; consider increasing to 12.5 mg BID before starting low dose  amiodarone  -consider Eliquis and discontinue asa and Plavix in one week if no active GI bleeding and stable hemoglobin from 8/24 -hemoglobin has remained stable but very low around 7, no recurrent A-fib, monitor for now given agitation/fall 8-31 and consider initiating Eliquis when safe -Continue mag-ox to avoid hypomagnesemia -8-27: Single instance of bradycardia this morning 40s, may have been missed read, monitor through today and do not further adjust beta-blocker medications  -9-3 hr controlled after holding coreg -9/4-5 HR stable, continue to monitor -9/9 consider Eliquis this week if agitation continues to improve -9/10 will hold off on Eliquis due to sundowning at night, will  see how he does with increase Geodon -9/11 EKG for bradycardia episode today, 2nd degree A-V block mobitz type 1.  Continue to hold beta blocker,9/11 will restart eliquis at 2.5 mg BID dose. Will consult cardiology regarding need for ASA/plavix and coreg.  Dr. Elease Hashimoto does not appear convinced that he has A-fib 9/12 appreciate cardiology consult yesterday, DAPT transition to daily aspirin 81 only and Eliquis was discontinued, caution with Geodon as this can contribute to bradycardia 9/13 will decrease Geodon nighttime dose to 20 due to episodes of bradycardia 9/17 will discontinue Geodon due to variable cardiac effects 9/18-20 heart rate stable      04/08/2023    7:47 AM 04/08/2023    6:09 AM 04/07/2023    3:00 PM  Vitals with BMI  Systolic  155 --  Diastolic  76 --  Pulse 78 82        16: HFrEF: Daily weights. Monitor for signs of overload  - 9/7 continue daily weights, fluid level management per hemodialysis team Weight reviewed and stable  9/21- 94.3- down 3 kg-   9/22- no weight for today- too agitated per nursing [facility] Weights   04/05/23 1815 04/05/23 1820 04/07/23 1439  Weight: 94.3 kg 94.3 kg 90.7 kg      17: Liver nodularity on CT scan: LFTs reported to be normal. Avoid hepatotoxic medications     -Abdominal ultrasound earlier this admission with minimal ascites; ammonia within normal limits  -8/31: Did discuss with nephrology whether p.o. lactulose would be beneficial despite normal ammonia, after reviewing records unsure that this would be beneficial    18: Epistaxis - resolved             -previously improved with flonase and saline spray   19: Morbid Obesity. Dietary counseling Body mass index is 39.01 kg/m.  20. Bradycardic: continue to monitor HR TID  -Discontinue Geodon  -9/19 heart rate in the 80s, stable  21. HTN: resolved after HD, continue to monitor TID  9/19-20 intermittently elevated, continue to monitor.  Cautious with medication addition to avoid hypotension  9/21-9/22-  BP 140 -150 systolic- wait to add anything due to HD.   22. Hematuria  9/22- required in/out to check U/A- having new hematuria- nursing to keep an eye- since also bleeding from penis a little- monitor   I spent a total of 59   minutes on total care today- >50% coordination of care- due to   Pt seen twice- was in frequent contact with nursing about labs, cath- etc  LOS: 27 days A FACE TO FACE EVALUATION WAS PERFORMED  Garo Heidelberg 04/08/2023, 2:01 PM

## 2023-04-08 NOTE — Consult Note (Signed)
Chief Complaint: Patient was seen in consultation today for PC/TDC placement at the request of Alonna Buckler NP  Referring Physician(s): Alonna Buckler NP  Supervising Physician: Marliss Coots  Patient Status: [facility] - In-pt  History of Present Illness: [name] [name] is a 72 y.o. male with PMHs of CAD, PUD, DM, OAS, HLD, ESRD who was on PD which was removed on 02/19/23, LUE AVF in place, IR was consulted for image guided tunneled HD cath placement.   Patient was admitted to [facility] on 02/15/23 after presenting to [facility] ED with acute metabolic encephalopathy assumed due to recent Covid infection, PD catheter malfunctioning, later found to have sepsis secondary to peritoneal dialysis associated catheter peritonitis and polymicrobial bacteremia/fungemia, s/p PD removal on 02/19/23. Hospital course complicated by worsening encephalopathy with hypoxemia.   Patient had LUE AVF in place, he underwent fistulogram with balloon venoplasty on 8/14 by Dr. Karin Lieu, the fistula was used for HD treatment on 8/16. Patient was discharged to [facility] on 8/16, he was admitted to CIR on 03/12/23. In CIR, patient continued to be confused and agitated, psychiatry is following. Patient has been disruptive during HD at times, per nephrology note he was too combative for HD on 04/07/23, agitation appears to be made worse with cannulation of AVF.   IR was consulted for possible image guided tunneled HD catheter placement.  Case reviewed with Dr. Elby Showers, patient becoming combative with AVF access is not an indication for tunneled HD cath placement.  The case was NOT approved.   Patient seen in room, completely naked. Laying in bed, NAD. Follows commands but became irritated with this PA performing physical exam. Wife was called, no answer.   Past Medical History:  Diagnosis Date   Anemia    Arthritis    Asthma    Bell palsy    CAD (coronary artery disease)    a. 2014 MV: abnl w/ infap ischemia; b. 03/2013 Cath: aneurysmal bleb  in the LAD w/ otw nonobs dzs-->Med Rx.   Chronic back pain    Chronic knee pain    a. 09/2015 s/p R TKA.   Chronic pain    Chronic shoulder pain    Chronic sinusitis    COPD (chronic obstructive pulmonary disease) (HCC)    Diabetes mellitus without complication (HCC)    type II    ESRD on peritoneal dialysis (HCC)    on peritoneal dialysis, [facility]   Essential hypertension    GERD (gastroesophageal reflux disease)    Gout    Gout    Hepatomegaly    noted on noncontrast CT 2015   History of hiatal hernia    Hyperlipidemia    Lateral meniscus tear    Obesity    Truncal   Obstructive sleep apnea    does not use cpap    On home oxygen therapy    uses 2l when is going somewhere per patient    PUD (peptic ulcer disease)    remote, reports f/u EGD about 8 years ago unremarkable    Reactive airway disease    related to exposure to chemical during 9/11   Sinusitis    Vitamin D deficiency     Past Surgical History:  Procedure Laterality Date   A/V FISTULAGRAM N/A 02/28/2023   Procedure: A/V Fistulagram;  Surgeon: Victorino Sparrow, MD;  Location: [facility] INVASIVE CV LAB;  Service: Cardiovascular;  Laterality: N/A;   ASAD LT SHOULDER  12/15/2008   left shoulder   AV FISTULA PLACEMENT  Left 08/09/2016   Procedure: BRACHIOCEPHALIC ARTERIOVENOUS (AV) FISTULA CREATION LEFT ARM;  Surgeon: Sherren Kerns, MD;  Location: [facility] OR;  Service: Vascular;  Laterality: Left;   CAPD INSERTION N/A 10/07/2018   Procedure: LAPAROSCOPIC PERITONEAL CATHETER PLACEMENT;  Surgeon: Sheliah Hatch De Blanch, MD;  Location: [facility] ORS;  Service: General;  Laterality: N/A;   CATARACT EXTRACTION W/PHACO Left 03/28/2016   Procedure: CATARACT EXTRACTION PHACO AND INTRAOCULAR LENS PLACEMENT LEFT EYE;  Surgeon: Jethro Bolus, MD;  Location: [facility] ORS;  Service: Ophthalmology;  Laterality: Left;  CDE: 4.77   CATARACT EXTRACTION W/PHACO Right 04/11/2016   Procedure: CATARACT EXTRACTION PHACO AND INTRAOCULAR LENS PLACEMENT  RIGHT EYE; CDE:  4.74;  Surgeon: Jethro Bolus, MD;  Location: [facility] ORS;  Service: Ophthalmology;  Laterality: Right;   COLONOSCOPY  10/15/2008   Fields: Rectal polyp obliterated, not retrieved, hemorrhoids, single ascending colon diverticulum near the CV. Next colonoscopy April 2020   COLONOSCOPY N/A 12/25/2014   SLF: 1. Colorectal polyps (2) removed 2. Small internal hemorrhoids 3. the left colon is severely redundant. hyperplastic polyps   CORONARY ARTERY BYPASS GRAFT N/A 06/05/2022   Procedure: OFF PUMP CORONARY ARTERY BYPASS GRAFTING (CABG) X 2 BYPASSES USING LEFT INTERNAL MAMMARY ARTERY AND RIGHT LEG GREATER SAPHENOUS VEIN HARVESTED ENDOSCOPICALLY;  Surgeon: Corliss Skains, MD;  Location: [facility] OR;  Service: Open Heart Surgery;  Laterality: N/A;   CORONARY STENT INTERVENTION N/A 07/25/2021   Procedure: CORONARY STENT INTERVENTION;  Surgeon: Tonny Bollman, MD;  Location: [facility] INVASIVE CV LAB;  Service: Cardiovascular;  Laterality: N/A;   CORONARY STENT INTERVENTION N/A 12/26/2021   Procedure: CORONARY STENT INTERVENTION;  Surgeon: Yvonne Kendall, MD;  Location: [facility] INVASIVE CV LAB;  Service: Cardiovascular;  Laterality: N/A;   CORONARY STENT INTERVENTION N/A 01/20/2022   Procedure: CORONARY STENT INTERVENTION;  Surgeon: Tonny Bollman, MD;  Location: [facility] INVASIVE CV LAB;  Service: Cardiovascular;  Laterality: N/A;   DOPPLER ECHOCARDIOGRAPHY     ESOPHAGOGASTRODUODENOSCOPY N/A 12/25/2014   SLF: 1. Anemia most likely due to CRI, gastritis, gastric polyps 2. Moderate non-erosive gastriits and mild duodenitis.  3.TWo large gstric polyps removed.    EYE SURGERY  12/22/2010   tear duct probing-[place]   FOREIGN BODY REMOVAL  03/29/2011   Procedure: REMOVAL FOREIGN BODY EXTREMITY;  Surgeon: Fuller Canada, MD;  Location: [facility] ORS;  Service: Orthopedics;  Laterality: Right;  Removal Foreign Body Right Thumb   IR FLUORO GUIDE CV LINE RIGHT  08/06/2018   IR FLUORO GUIDE CV LINE RIGHT  02/17/2023   IR US  GUIDE VASC ACCESS RIGHT  08/06/2018   IR US GUIDE VASC ACCESS RIGHT  02/17/2023   KNEE ARTHROSCOPY  10/16/2007   left   KNEE ARTHROSCOPY WITH LATERAL MENISECTOMY Right 10/14/2015   Procedure: LEFT KNEE ARTHROSCOPY WITH PARTIAL LATERAL MENISECTOMY;  Surgeon: Vickki Hearing, MD;  Location: [facility]S;  Service: Orthopedics;  Laterality: Right;   LEFT HEART CATH AND CORONARY ANGIOGRAPHY N/A 07/25/2021   Procedure: LEFT HEART CATH AND CORONARY ANGIOGRAPHY;  Surgeon: Tonny Bollman, MD;  Location: [facility] INVASIVE CV LAB;  Service: Cardiovascular;  Laterality: N/A;   LEFT HEART CATH AND CORONARY ANGIOGRAPHY N/A 12/26/2021   Procedure: LEFT HEART CATH AND CORONARY ANGIOGRAPHY;  Surgeon: Yvonne Kendall, MD;  Location: [facility] INVASIVE CV LAB;  Service: Cardiovascular;  Laterality: N/A;   LEFT HEART CATH AND CORONARY ANGIOGRAPHY N/A 01/20/2022   Procedure: LEFT HEART CATH AND CORONARY ANGIOGRAPHY;  Surgeon: Tonny Bollman, MD;  Location: [facility] INVASIVE CV LAB;  Service: Cardiovascular;  Laterality: N/A;   LEFT HEART CATHETERIZATION WITH CORONARY ANGIOGRAM N/A 03/28/2013   Procedure: LEFT HEART CATHETERIZATION WITH CORONARY ANGIOGRAM;  Surgeon: Marykay Lex, MD;  Location: [facility] CATH LAB;  Service: Cardiovascular;  Laterality: N/A;   NM MYOVIEW LTD     PENILE PROSTHESIS IMPLANT N/A 08/16/2015   Procedure: PENILE PROTHESIS INFLATABLE, three piece, Excisional biopsy of Penile ulcer, Penile molding;  Surgeon: Jethro Bolus, MD;  Location: [facility] ORS;  Service: Urology;  Laterality: N/A;   PENILE PROSTHESIS IMPLANT N/A 12/24/2017   Procedure: REMOVAL AND  REPLACEMENT  COLOPLAST PENILE PROSTHESIS;  Surgeon: Crista Elliot, MD;  Location: [facility] ORS;  Service: Urology;  Laterality: N/A;   PERIPHERAL VASCULAR BALLOON ANGIOPLASTY  02/28/2023   Procedure: PERIPHERAL VASCULAR BALLOON ANGIOPLASTY;  Surgeon: Victorino Sparrow, MD;  Location: [facility] INVASIVE CV LAB;  Service: Cardiovascular;;   PORT-A-CATH REMOVAL N/A 02/19/2023    Procedure: REMOVAL OF PERITONEAL DIALYSIS CATHETER;  Surgeon: Victorino Sparrow, MD;  Location: [facility] OR;  Service: Vascular;  Laterality: N/A;   QUADRICEPS TENDON REPAIR  07/21/2011   Procedure: REPAIR QUADRICEP TENDON;  Surgeon: Fuller Canada, MD;  Location: [facility]S;  Service: Orthopedics;  Laterality: Right;   RIGHT/LEFT HEART CATH AND CORONARY ANGIOGRAPHY N/A 05/30/2022   Procedure: RIGHT/LEFT HEART CATH AND CORONARY ANGIOGRAPHY;  Surgeon: Corky Crafts, MD;  Location: [facility] INVASIVE CV LAB;  Service: Cardiovascular;  Laterality: N/A;   TEE WITHOUT CARDIOVERSION N/A 06/05/2022   Procedure: TRANSESOPHAGEAL ECHOCARDIOGRAM (TEE);  Surgeon: Corliss Skains, MD;  Location: [facility] OR;  Service: Open Heart Surgery;  Laterality: N/A;   TOENAIL EXCISION     removed x2-bilateral   UMBILICAL HERNIA REPAIR  07/17/2005   [place]    Allergies: Opana [oxymorphone hcl] and Tramadol  Medications: Prior to Admission medications   Medication Sig Start Date End Date Taking? Authorizing Provider  albuterol (VENTOLIN HFA) 108 (90 Base) MCG/ACT inhaler Inhale 2 puffs into the lungs every 6 (six) hours as needed for wheezing or shortness of breath. 03/25/21   Oretha Milch, MD  aspirin EC 81 MG tablet Take 81 mg by mouth daily. Swallow whole.    [provider]  atorvastatin (LIPITOR) 80 MG tablet Take 1 tablet (80 mg total) by mouth daily. 06/20/22   Ardelle Balls, PA-C  clopidogrel (PLAVIX) 75 MG tablet Take 1 tablet (75 mg total) by mouth daily. Please take first dose at 9 pm on 12/4 06/19/22   Ardelle Balls, PA-C  feeding supplement (ENSURE ENLIVE / ENSURE PLUS) LIQD Take 237 mLs by mouth 2 (two) times daily between meals. 03/02/23   Ghimire, Werner Lean, MD  fluticasone (FLONASE) 50 MCG/ACT nasal spray Place 1 spray into both nostrils as needed for allergies or rhinitis. Patient not taking: Reported on 02/16/2023 10/11/22   [provider]  insulin aspart (NOVOLOG) 100 UNIT/ML  injection 0-6 Units, Subcutaneous, 3 times daily with meals, CBG < 70: Implement Hypoglycemia measures CBG 70 - 120: 0 units CBG 121 - 150: 0 units CBG 151 - 200: 1 unit CBG 201-250: 2 units CBG 251-300: 3 units CBG 301-350: 4 units CBG 351-400: 5 units CBG > 400: Give 6 units and call MD 03/02/23   Maretta Bees, MD  midodrine (PROAMATINE) 10 MG tablet Take 1 tablet (10 mg total) by mouth 3 (three) times daily with meals. 03/02/23   Ghimire, Werner Lean, MD  midodrine (PROAMATINE) 10 MG tablet Take 1 tablet (10 mg total) by mouth every dialysis (  give 30- 60 min prior to each HD session). 03/02/23   Ghimire, Werner Lean, MD  multivitamin (RENA-VIT) TABS tablet Take 1 tablet by mouth at bedtime. 03/02/23   Ghimire, Werner Lean, MD  nitroGLYCERIN (NITROSTAT) 0.4 MG SL tablet Place 1 tablet (0.4 mg total) under the tongue every 5 (five) minutes as needed for chest pain. 03/02/23   Ghimire, Werner Lean, MD  Nystatin (GERHARDT'S BUTT CREAM) CREA Apply 1 Application topically 4 (four) times daily. 03/02/23   Ghimire, Werner Lean, MD  pantoprazole (PROTONIX) 40 MG tablet Take 1 tablet (40 mg total) by mouth 2 (two) times daily. 03/02/23   Ghimire, Werner Lean, MD  SYMBICORT 160-4.5 MCG/ACT inhaler INHALE 2 PUFFS INTO THE LUNGS TWICE DAILY. 01/08/23   Leslye Peer, MD     Family History  Problem Relation Age of Onset   Hypertension Mother        MI   Cancer Mother        breast    Diabetes Mother    Diabetes Father    Hypertension Father    Hypertension Sister    Diabetes Sister    Arthritis Other    Asthma Other    Lung disease Other    Anesthesia problems Neg Hx    Hypotension Neg Hx    Malignant hyperthermia Neg Hx    Pseudochol deficiency Neg Hx    Colon cancer Neg Hx     Social History   Socioeconomic History   Marital status: Married    Spouse name: Not on file   Number of children: 2   Years of education: 12th grade   Highest education level: Not on file  Occupational History   Occupation:  disabled   Occupation:      Associate Professor: UNEMPLOYED  Tobacco Use   Smoking status: Former    Current packs/day: 0.00    Average packs/day: 1 pack/day for 25.0 years (25.0 ttl pk-yrs)    Types: Cigarettes    Start date: 03/27/1985    Quit date: 03/27/2010    Years since quitting: 13.0   Smokeless tobacco: Never   Tobacco comments:    Quit x 7 years  Vaping Use   Vaping status: Never Used  Substance and Sexual Activity   Alcohol use: Not Currently    Comment: occasionally   Drug use: Yes    Types: Marijuana    Comment: cocaine- last time used- 11/24/2017 , marijuana-    Sexual activity: Yes  Other Topics Concern   Not on file  Social History Narrative   He quit smoking in 2010. 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 He developed pulmonary problems, became disabled because of lower airway disease in 2009.       WATCHES BASKETBALL. HIS TEAM IS [place].   Social Determinants of Health   Financial Resource Strain: Low Risk  (03/05/2023)   Received from [facility]   Overall Financial Resource Strain (CARDIA)    Difficulty of Paying Living Expenses: Not hard at all  Food Insecurity: No Food Insecurity (03/05/2023)   Received from [facility]   Hunger Vital Sign    Worried About Running Out of Food in the Last Year: Never true    Ran Out of Food in the Last Year: Never true  Transportation Needs: No Transportation Needs (03/12/2023)   Received from [facility]   SM SDOH Transportation Source    Has lack of transportation kept you from medical  appointments or from getting medications?: No    Has lack of transportation kept you from meetings, work, or from getting things needed for daily living?: No  Physical Activity: Sufficiently Active (12/29/2021)   Exercise Vital Sign    Days of Exercise per Week: 3 days    Minutes of Exercise per Session: 60 min  Stress: Patient Declined (03/12/2023)   Received from [facility]   [employer] of Occupational Health - Occupational Stress Questionnaire    Feeling of Stress : Patient declined  Social Connections: Moderately Integrated (03/05/2023)   Received from [facility]   Social Connection and Isolation Panel [NHANES]    Frequency of Communication with Friends and Family: More than three times a week    Frequency of Social Gatherings with Friends and Family: More than three times a week    Attends Religious Services: More than 4 times per year    Active Member of [employer] or Organizations: No    Attends Banker Meetings: Never    Marital Status: Married     Review of Systems: A 12 point ROS discussed and pertinent positives are indicated in the HPI above.  All other systems are negative.  Vital Signs: BP (!) 155/76 (BP Location: Right Arm)   Pulse 78   Temp 97.8 F (36.6 C)   Resp 18   Ht 5\' 4"  (1.626 m)   Wt 199 lb 15.3 oz (90.7 kg)   SpO2 96%   BMI 34.32 kg/m    Physical Exam Vitals reviewed.  Constitutional:      General: He is not in acute distress.    Appearance: He is not ill-appearing.  HENT:     Head: Normocephalic and atraumatic.     Mouth/Throat:     Mouth: Mucous membranes are moist.     Pharynx: Oropharynx is clear.  Cardiovascular:     Rate and Rhythm: Normal rate and regular rhythm.     Heart sounds: Normal heart sounds.  Pulmonary:     Effort: Pulmonary effort is normal.     Breath sounds: Normal breath sounds.  Abdominal:     General: Abdomen is flat. Bowel sounds are normal.     Palpations: Abdomen is soft.  Musculoskeletal:     Cervical back: Neck supple.  Skin:    General: Skin is warm and dry.     Coloration: Skin is not jaundiced or pale.  Neurological:     Mental Status: He is alert.     MD Evaluation Airway: WNL Heart: WNL Abdomen: WNL Chest/ Lungs: WNL ASA  Classification: 3 Mallampati/Airway Score: Two  Imaging: MR BRAIN WO CONTRAST  Result Date: 03/16/2023 CLINICAL DATA:  72 year old  male with recent sepsis, pain, encephalopathy. COVID-19. Altered mental status. EXAM: MRI HEAD WITHOUT CONTRAST TECHNIQUE: Multiplanar, multiecho pulse sequences of the brain and surrounding structures were obtained without intravenous contrast. COMPARISON:  Head CT 02/15/2023. FINDINGS: Brain: Stable cerebral volume. No convincing restricted diffusion to suggest acute infarction. No midline shift, mass effect, evidence of mass lesion, ventriculomegaly, extra-axial collection or acute intracranial hemorrhage. Cervicomedullary junction and pituitary are within normal limits. Patchy and confluent bilateral cerebral white matter T2 and FLAIR hyperintensity, especially in the periventricular white matter. No cortical encephalomalacia. However, chronic microhemorrhage in the right thalamus, and also suggestion of chronic hemosiderin in the left superior frontal gyrus on SWI (series 7, image 77). Otherwise the deep gray nuclei, brainstem and cerebellum appear negative for age. Vascular: Major intracranial vascular flow voids are  preserved. Skull and upper cervical spine: Chronic right lamina papyracea fracture. Otherwise negative. Visualized bone marrow signal is within normal limits. Sinuses/Orbits: Postoperative changes to both globes. Mild and chronic appearing left frontal sinus opacification. Other paranasal sinuses are stable and well aerated. Other: Trace mastoid fluid bilaterally. Negative visible nasopharynx. Grossly normal other visible internal auditory structures. Negative visible scalp and face. IMPRESSION: 1. No acute intracranial abnormality. 2. Moderate for age white matter signal changes with occasional chronic microhemorrhages, most commonly due to chronic small vessel disease. Electronically Signed   By: Odessa Fleming M.D.   On: 03/16/2023 11:09   EEG adult  Result Date: 03/14/2023 [name], MD     03/14/2023  3:59 PM Patient Name: KCI BAES MRN: [id number] Epilepsy Attending: [name]  Referring Physician/Provider: [name], MD Date: 03/14/2023 Duration: 24.27 mins Patient history: 72yo M with ams getting eeg to evaluate for seizure Level of alertness: Awake, AEDs during EEG study: None Technical aspects: This EEG study was done with scalp electrodes positioned according to the 10-20 International system of electrode placement. Electrical activity was reviewed with band pass filter of 1-70Hz , sensitivity of 7 uV/mm, display speed of 44mm/sec with a 60Hz  notched filter applied as appropriate. EEG data were recorded continuously and digitally stored.  Video monitoring was available and reviewed as appropriate. Description: The posterior dominant rhythm consists of 8 Hz activity of moderate voltage (25-35 uV) seen predominantly in posterior head regions, symmetric and reactive to eye opening and eye closing. EEG showed intermittent generalized  3 to 6 Hz theta-delta slowing. Hyperventilation and photic stimulation were not performed.   ABNORMALITY - Intermittent slow, generalized IMPRESSION: This study is suggestive of mild diffuse encephalopathy, nonspecific etiology. No seizures or epileptiform discharges were seen throughout the recording. Priyanka O Yadav    Labs:  CBC: Recent Labs    03/22/23 1235 03/24/23 0925 03/29/23 1529 03/31/23 1420  WBC 5.0 4.3 4.5 3.6*  HGB 8.8* 8.1* 8.6* 9.6*  HCT 29.7* 28.1* 30.2* 32.8*  PLT 192 186 152 155    COAGS: Recent Labs    06/04/22 2118 06/05/22 1318 02/15/23 0827 02/17/23 2332 02/18/23 0425 03/03/23 0144 03/08/23 0034  INR 1.0 1.2   < > 1.2 1.2 1.1 1.0  APTT 88* 35  --   --  54*  --  41*   < > = values in this interval not displayed.    BMP: Recent Labs    03/29/23 1528 03/31/23 1420 04/01/23 0816 04/06/23 1330  NA 137 134* 134* 135  K 4.2 2.9* 4.0 4.1  CL 97* 94* 97* 93*  CO2 24 29 29 31   GLUCOSE 107* 102* 105* 106*  BUN 57* 12 37* 22  CALCIUM 9.0 8.0* 8.7* 9.2  CREATININE 10.88* 2.13* 8.09* 7.07*   GFRNONAA 5* 32* 7* 8*    LIVER FUNCTION TESTS: Recent Labs    02/21/23 0407 02/23/23 0352 03/02/23 0910 03/03/23 0144 03/06/23 0723 03/29/23 1528 03/31/23 1420 04/01/23 0816 04/06/23 1330  BILITOT 1.2  --  0.9 0.5  --   --   --   --  1.0  AST 75*  --  38 27  --   --   --   --  43*  ALT 178*  --  36 29  --   --   --   --  34  ALKPHOS 87  --  115 92  --   --   --   --  86  PROT 5.1*  --  7.4 6.2*  --   --   --   --  7.0  ALBUMIN <1.5*   < > 1.7* 1.5*   < > 2.1* 2.4* 2.1* 2.6*   < > = values in this interval not displayed.    TUMOR MARKERS: No results for input(s): "AFPTM", "CEA", "CA199", "CHROMGRNA" in the last 8760 hours.  Assessment and Plan: 72 y.o. male with ESRD and confusion, becomes combative with AVF access at times, IR was consulted for PC/TDC placement but confusion is not an indication for tunneled HD cath placement which has risks of line related infection.   Patient does not have PIV, not cooperative with physical exam, he will most likely not tolerate PC placement with just moderate sedation.   IR will NOT proceed with tunneled HD cath placement.  Nephrology informed.    Thank you for this interesting consult.  I greatly enjoyed meeting [name] [name] and look forward to participating in their care.  A copy of this report was sent to the requesting provider on this date.  Electronically Signed: Willette Brace, PA-C 04/08/2023, 2:06 PM   I spent a total of 40 Minutes    in face to face in clinical consultation, greater than 50% of which was counseling/coordinating care for possible PC/TDC placement.   This chart was dictated using voice recognition software.  Despite best efforts to proofread,  errors can occur which can change the documentation meaning.

## 2023-04-09 ENCOUNTER — Encounter (HOSPITAL_COMMUNITY): Payer: Medicare Other

## 2023-04-09 DIAGNOSIS — R319 Hematuria, unspecified: Secondary | ICD-10-CM

## 2023-04-09 DIAGNOSIS — G9341 Metabolic encephalopathy: Secondary | ICD-10-CM | POA: Diagnosis not present

## 2023-04-09 DIAGNOSIS — Z515 Encounter for palliative care: Secondary | ICD-10-CM | POA: Diagnosis not present

## 2023-04-09 DIAGNOSIS — Z7189 Other specified counseling: Secondary | ICD-10-CM | POA: Diagnosis not present

## 2023-04-09 LAB — RENAL FUNCTION PANEL
Albumin: 2.5 g/dL — ABNORMAL LOW (ref 3.5–5.0)
Anion gap: 13 (ref 5–15)
BUN: 51 mg/dL — ABNORMAL HIGH (ref 8–23)
CO2: 26 mmol/L (ref 22–32)
Calcium: 9 mg/dL (ref 8.9–10.3)
Chloride: 99 mmol/L (ref 98–111)
Creatinine, Ser: 12.78 mg/dL — ABNORMAL HIGH (ref 0.61–1.24)
GFR, Estimated: 4 mL/min — ABNORMAL LOW (ref >60)
Glucose, Bld: 100 mg/dL — ABNORMAL HIGH (ref 70–99)
Phosphorus: 4.7 mg/dL — ABNORMAL HIGH (ref 2.5–4.6)
Potassium: 4.1 mmol/L (ref 3.5–5.1)
Sodium: 138 mmol/L (ref 135–145)

## 2023-04-09 LAB — CBC
HCT: 32.3 % — ABNORMAL LOW (ref 39.0–52.0)
Hemoglobin: 9.6 g/dL — ABNORMAL LOW (ref 13.0–17.0)
MCH: 27.3 pg (ref 26.0–34.0)
MCHC: 29.7 g/dL — ABNORMAL LOW (ref 30.0–36.0)
MCV: 91.8 fL (ref 80.0–100.0)
Platelets: 185 10*3/uL (ref 150–400)
RBC: 3.52 MIL/uL — ABNORMAL LOW (ref 4.22–5.81)
RDW: 15.6 % — ABNORMAL HIGH (ref 11.5–15.5)
WBC: 3.9 10*3/uL — ABNORMAL LOW (ref 4.0–10.5)
nRBC: 0 % (ref 0.0–0.2)

## 2023-04-09 MED ORDER — ALTEPLASE 2 MG IJ SOLR: 2.0000 mg | Freq: Once | INTRAMUSCULAR | Status: DC | PRN

## 2023-04-09 MED ORDER — LIDOCAINE HCL (PF) 1 % IJ SOLN: 5.0000 mL | INTRAMUSCULAR | Status: DC | PRN

## 2023-04-09 MED ORDER — PENTAFLUOROPROP-TETRAFLUOROETH EX AERO: 1.0000 | INHALATION_SPRAY | CUTANEOUS | Status: DC | PRN

## 2023-04-09 MED ORDER — LIDOCAINE-PRILOCAINE 2.5-2.5 % EX CREA: 1.0000 | TOPICAL_CREAM | CUTANEOUS | Status: DC | PRN

## 2023-04-09 MED ORDER — HEPARIN SODIUM (PORCINE) 1000 UNIT/ML DIALYSIS: 1000.0000 [IU] | INTRAMUSCULAR | Status: DC | PRN

## 2023-04-09 MED ORDER — DIVALPROEX SODIUM 125 MG PO CSDR
125.0000 mg | DELAYED_RELEASE_CAPSULE | Freq: Three times a day (TID) | ORAL | Status: DC
  Administered 2023-04-09 – 2023-04-19 (×25): 125 mg via ORAL
  Filled 2023-04-09 (×31): qty 1

## 2023-04-09 MED ORDER — ANTICOAGULANT SODIUM CITRATE 4% (200MG/5ML) IV SOLN
5.0000 mL | Status: DC | PRN
  Filled 2023-04-09: qty 5

## 2023-04-09 MED ORDER — ANTICOAGULANT SODIUM CITRATE 4% (200MG/5ML) IV SOLN: 5.0000 mL | Status: DC | PRN

## 2023-04-09 MED ORDER — HEPARIN SODIUM (PORCINE) 1000 UNIT/ML DIALYSIS: 20.0000 [IU]/kg | INTRAMUSCULAR | Status: DC | PRN

## 2023-04-09 MED ORDER — HEPARIN SODIUM (PORCINE) 1000 UNIT/ML DIALYSIS
20.0000 [IU]/kg | INTRAMUSCULAR | Status: DC | PRN
  Filled 2023-04-09: qty 2

## 2023-04-09 NOTE — Progress Notes (Signed)
PROGRESS NOTE   Subjective/Complaints:  No acute events overnight.  Nursing reports he did not sleep very well last night.  ZOX:[id number] due to cognition/behavioral   Objective:   No results found. Recent Labs    04/08/23 1517  WBC 4.4  HGB 9.4*  HCT 31.5*  PLT 169      Recent Labs    04/06/23 1330 04/08/23 1517  NA 135 139  K 4.1 3.9  CL 93* 96*  CO2 31 26  GLUCOSE 106* 105*  BUN 22 44*  CREATININE 7.07* 10.93*  CALCIUM 9.2 9.0       Intake/Output Summary (Last 24 hours) at 04/09/2023 1009 Last data filed at 04/09/2023 0815 Gross per 24 hour  Intake 478 ml  Output 0 ml  Net 478 ml          Physical Exam: Vital Signs Blood pressure (!) 143/80, pulse 80, temperature 97.7 F (36.5 C), temperature source Oral, resp. rate 18, height 5\' 4"  (1.626 m), weight 90.7 kg, SpO2 95%.     General: NAD, sitting at nurses station HENT: conjugate gaze; oropharynx moist CV: regular rate; no JVD Pulmonary: CTA B/L; no W/R/R- good air movement GI: soft, NT, ND, (+)BS Psychiatric: inappropriate, confused Neurological: Calm sitting at nurses station  Ext: no clubbing, cyanosis, or edema Psych: Confused, not agitated at this time Neurologic Exam:   Appears drowsy this AM.  Oriented to self, rehabilitation center + Memory deficit - ongoing  Sensory exam: Intact to touch in all 4 extremities Motor exam: moves all 4 extremities without limitiations Coordination: Slight bilateral upper extremity tremor, intention, mild.    Musculoskeletal: No joint swelling noted No abnormal tone      Assessment/Plan: 1. Functional deficits which require 15 hr per week of interdisciplinary therapy in a comprehensive inpatient rehab setting. Physiatrist is providing close team supervision and 24 hour management of active medical problems listed below. Physiatrist and rehab team continue to assess barriers to  discharge/monitor patient progress toward functional and medical goals  Care Tool:  Bathing    Body parts bathed by patient: Right arm, Left arm, Chest, Abdomen, Front perineal area, Right upper leg, Buttocks, Left upper leg, Right lower leg, Left lower leg, Face         Bathing assist Assist Level: Moderate Assistance - Patient 50 - 74%     Upper Body Dressing/Undressing Upper body dressing   What is the patient wearing?: Pull over shirt    Upper body assist Assist Level: Minimal Assistance - Patient > 75%    Lower Body Dressing/Undressing      Lower body dressing      What is the patient wearing?: Underwear/pull up, Pants     Lower body assist Assist for lower body dressing: Maximal Assistance - Patient 25 - 49%     Toileting Toileting    Toileting assist Assist for toileting: Maximal Assistance - Patient 25 - 49%     Transfers Chair/bed transfer  Transfers assist     Chair/bed transfer assist level: Maximal Assistance - Patient 25 - 49%     Locomotion Ambulation   Ambulation assist      Assist level: Minimal Assistance -  Patient > 75% Assistive device: No Device Max distance: 175'   Walk 10 feet activity   Assist     Assist level: Contact Guard/Touching assist Assistive device: No Device   Walk 50 feet activity   Assist Walk 50 feet with 2 turns activity did not occur: Safety/medical concerns  Assist level: Minimal Assistance - Patient > 75% Assistive device: No Device    Walk 150 feet activity   Assist Walk 150 feet activity did not occur: Safety/medical concerns  Assist level: Minimal Assistance - Patient > 75% Assistive device: No Device    Walk 10 feet on uneven surface  activity   Assist Walk 10 feet on uneven surfaces activity did not occur: Safety/medical concerns         Wheelchair     Assist Is the patient using a wheelchair?: Yes Type of Wheelchair: Manual    Wheelchair assist level: Dependent -  Patient 0%      Wheelchair 50 feet with 2 turns activity    Assist        Assist Level: Dependent - Patient 0%   Wheelchair 150 feet activity     Assist      Assist Level: Dependent - Patient 0%   Blood pressure (!) 143/80, pulse 80, temperature 97.7 F (36.5 C), temperature source Oral, resp. rate 18, height 5\' 4"  (1.626 m), weight 90.7 kg, SpO2 95%.  Medical Problem List and Plan: 1. Functional deficits secondary to metabolic encephalopathy due to sepsis secondary to catheter peritonitis and hypoxemia              -patient may not shower             -ELOS/Goals: 16-18 days, PT/OT Sup, SLP min A -tentative discharge date 9-9           -Continue CIR therapies including PT, OT, and SLP   -Will review records from [facility] -- has worsened since approximately 8/22, possible 8/19, consistent with records from nursing at [facility] with current behavior.    -Enclosure has been discontinued   -Therapy QD schedule  -Therapy suggest having patient set up for short time before his therapy sessions to help him wake up.  I think this would be a good idea  Con't CIR PT, OT and SLP  Patient was seen by palliative care, changed to DNR status 2.  Antithrombotics: -DVT/anticoagulation:  Pharmaceutical: Heparin, 03/23/23 changed to Lovenox yesterday to help improved compliance             -antiplatelet therapy: aspirin and Plavix   3. Pain Management: Tylenol as needed             -Lidoderm patches to back  -Elavil 25 mg at bedtime for nerve pain and sleep 8/27  -8/29: Pain well controlled on Elavil; DC lyrica, PRN Tylenol/oxy 2.5-5 mg d/t CKD  8-30: Appears patient has been getting pain medications together overnight along with antipsychotics, will discuss with nursing  8-31: Patient having an difficulty endorsing needs and not utilizing PRNs, scheduled oxycodone 5 mg 3 times daily and has 2.5 mg prn 3 times daily available.   9-1: Not utilizing PRNs, minimal complaints of pain,  will discontinue scheduled given lethargy today..  -muscle rub R shoulder  9/16 patient reports pain is under control overall   9/23 denies pain 4. Mood/Behavior/Sleep: LCSW to evaluate and provide emotional support             -continue Seroquel 50 mg TID             -  clonazepam 0.5 mg TID prn             -Geodon 10 mg IM q 12 hours prn             -antipsychotic agents: n/a  -8-27: Agitated by soft restraints in bed, transition to 4 side rails, Posey alarm, and TeleSitter for now.  Veil bed on standby if needed, however feel we should avoid this if possible due to likelihood of agitating patient further   - 8/28: Placed in veil bed; wrist restraints for dialysis. Scheduled Geodon 20 mg twice daily; discontinue Seroquel and clonazepam due to no perceivable benefit. Add as needed Zyprexa 5 mg twice daily p.o. or IM due to recent cardiac arrhythmias and concern for QTc prolongation. As needed ramelteon for sleep. Stop gabapentin due to possible side effect of agitation  - 8/28: No improvement except temporarily with Geodon; add depakote 125 mg q8h for ongoing aggitation, mood stabilization. Increase elavil to 50 mg at bedtime.  Workup ongoing.    - 8/29: EEG negative, labs normal. MRI pending. Sleeping well and calm since starting depakote and increase in elavil. Will monitor tonight and wean Geodon in AM if appropriate.   8-30: MRI showing chronic microvascular changes, otherwise normal.  Increasingly calm and appropriate during the day, with ongoing agitation at nighttime.  Will increase nighttime Geodon to 40 mg; will discuss sleep log with nursing, as it has not been performed.  8-31 no sleep overnight per sleep log, again getting agitated around 9 PM and 5 AM.  Not responsive to as needed Zyprexa, responsive to Ativan.  Attempted to wean off scheduled a.m. Geodon today, but patient became extremely agitated and unable to redirect, calm down with readministration of medication.  Resume Geodon 20 mg  every morning/40 mg every afternoon, increased Elavil to 75 mg nightly, adding trazodone 50 mg nightly as needed.  P.o./IV Ativan 0.5 mg every 6 hours for agitation.  9-1: slept well overnight with trazodone and increased Elavil, no PRNs used.  Unfortunately, oversedated today.  Discontinue scheduled pain medications as above, start wean of Elavil to 50 mg x2 days -> 25 mg x2 days -> 10 mg x2 days, off due to no apparent benefit and no complaints of pain.  Reduce Geodon from 20 mg every morning/40 mg every evening to 20 mg twice daily.  Continue trazodone as needed for sleep, as this seems to be the difference for a restful night yesterday.  If he remains overly sedated, would dose reduce and DC Depakote next.  Keep Ativan as needed d/t dialysis not having other options available.  9/2-3--non agitated but still sleepy   -dc'ed depakote as I doubt it's doing much at this dose anyway   -continue geodon at 20mg  bid   -scheduled trazodone at HS with back up dose if needed    -sleep was intermittent last night   -requires enclosure bed for safety  9/4 continue enclosure bed for now, consider trying DC of this device early next week  -9/5 appears to be doing better overall this morning, continues to have intermittent agitation at times.  Continue enclosure bed  -9/6 agitation appears to be improving, patient is skeptical about medications and has been declining some of these.  Will discontinue multivitamin, RisaQuad to try to decrease pill burden  -9/7 continues to have intermittent episodes of agitation however overall improving, appears to be taking most of his medications  9/9 agitation and mental status appears to be improving -discussing discontinuation of  enclosure bed with team 9/10 Geodon dose increased to 40 mg at night, continue 20 mg in the morning 9/11 No acute events noted, will check with team regarding DC enclosure bed, nursing reports he did ok last night but had dialysis 9/12 continues to  have sundowning/waxing waning confusion and agitation, continue enclosure bed for now, will caution with Geodon will continue current dose but hold off on any escalation as this can contribute to bradycardia 9/16 no longer using enclosure bed, continue Geodon 20 mg twice daily for now due to intermittent agitation 9/17 patient with agitation and confusion at night, has sedation during the day.  Discussed with psychiatry Dr. Gasper Sells.  Will discontinue Geodon and try Abilify tonight.  Dr. Gasper Sells also suggested considering donepezil to shorten hallucinations of delirium.  Psychiatry consult placed, will see tomorrow.  Appreciate assistance 9/18 no behavioral related notes overnight, psychiatry plans to see him today.  Appreciate assistance 9/19 patient was seen by psychiatry yesterday, recommended continue Abilify, consider donepezil or memantine, suggested avoiding stimulants.  Will continue current regimen for now as it appears he is doing better.  He appears to be less sedated and did not see frequent notes of agitation but this regimen thus far. 9/20 recheck CMP/ammonia/VPA level today due to continued drowsiness  9/21- Depakote level 16- too low- Ammonia 19- low- asleep this AM, but haven't been called today about sedation/lethargy.  9/22- more hallucinations per nursing- labs attempted- couldn't get- couldn't get U/A results since so much hematuria. Trying to get labs, to see if getting ill vs missing HD? 9/23 will go back to 3 times daily for Depakote to help avoid agitation 5. Neuropsych/cognition: This patient is not capable of making decisions on his own behalf   -recommend neuropsych consult   - 8/27: Ongoing aggitation; Consult hospitalist for further workup on potential contributors to encephalopathy. Pending UA, LA 2.1-> 1.4; otherwise negative so far -Delirium precautions, sleep log, Veiled bed for safety and to minimize restraints -Wife has expressed that she is not interested in  palliative care at this time; hopefully with above measures patient can get dialysis in the next 48 hours to limit worsening uremic encephalopathy and medical decline    - 8/29: Aggitation much improved, able to get some dialysis last night and today. See above for medication changes. Calling wife tonight to discuss.   8-30: Still with moderate cognitive deficits, however appears once patient is less agitated he is better able to concentrate and has better orientation.  MRI negative for acute findings, monitor.  9/3 agitation better. More sedated than anything at this point. Confused as well.    -depakote stopped   -continue other meds as rx'ed  9/21- pt refused Depakote this AM "since doesn't have seizures".   9/22- per nursing, attempted 3 different times yesterday and today to give Depakote 6. Skin/Wound Care: Routine skin care checks             -sacral skin off-loading/protection   7. Fluids/Electrolytes/Nutrition: Strict Is and Os and follow-up chemistries per nephrology             -renal diet/ 1200 cc FR  - Renvela 800 mg 3 times daily for hyperphosphatemia added per nephrology 8-27   9/21- Last Cr 7.07 and BUN 22 8: Hypotension: monitor TID and prn             -continue midodrine as needed during dialysis  Normotensive, monitor  9-1: Consistently 90s over 60s, has been stable but given findings  of microvascular changes on MRI, may be too low for patient to perfuse properly.  Will add midodrine 5 mg tid and monitor.  DC Coreg as heart rate has been low to normal; may resume at low-dose once blood pressure improved.   9/2 continue with ccurrent regimen. Bp's hr remain low   -elavil wean should help  9/9-10 BP stable, continue midodrine and monitor  9/13 DC midodrine today as BP has been higher, can restart if needed  Improved, cont to monitor  9/23 SBP in the 140s, continue current regimen    04/08/2023    8:20 PM 04/08/2023    3:00 PM 04/08/2023    7:47 AM  Vitals with BMI   Systolic 143 145   Diastolic 80 70   Pulse  80 78      9: Hyperlipidemia: continue statin   10: CAD s/p CABG 2023: on DAPT and statin.  - troponin elevated but stable, monitor 8/27   11: DM-2: A1c = 6.3% (at home on Lantus 22 units at bedtime, Tradjenta 5 mg daily)        -monitor PO intake             -continue SSI (0-6 units) does not appear to be requiring coverage   - 9/6 DC SSI, change CBGs to daily  9/8 discontinue CBGs  9/16 glucose still controlled on BMP yesterday  Recheck on CMP today   9/21- BG 106 on labs No results for input(s): "GLUCAP" in the last 72 hours.    12: ESRD: HD on Tues, Thurs, Sat -nephrology made aware   - Unable to tolerate 8/27 d/t aggitation, stopped early 8/28;  - attempting again 8/29, stopped early again -Per nephrology, redirectable with gum -Have to switch restraints during dialysis sessions from veil bed to regular bed, then resume veil bed order once at rehab -9/9 tolerating HD better 9/18 patient's wife indicates she would be able to stay with him during dialysis after discharge  q9/22- pt refused/too agitated ot get HD yesterday 13: COPD/OSA; chronic home 2L O2 (Symbicort, Ventolin, prednisone 40 mg daily at home)             -continue symbicort  -Added home oxygen back 8-27   14: GERD: continue Protonix   15: Non-sustained VT/pAF/bradycardia: per cardiology, Dr. Algie Coffer: -continue carvedilol 6.25 mg BID; consider increasing to 12.5 mg BID before starting low dose amiodarone  -consider Eliquis and discontinue asa and Plavix in one week if no active GI bleeding and stable hemoglobin from 8/24 -hemoglobin has remained stable but very low around 7, no recurrent A-fib, monitor for now given agitation/fall 8-31 and consider initiating Eliquis when safe -Continue mag-ox to avoid hypomagnesemia -8-27: Single instance of bradycardia this morning 40s, may have been missed read, monitor through today and do not further adjust beta-blocker  medications  -9-3 hr controlled after holding coreg -9/4-5 HR stable, continue to monitor -9/9 consider Eliquis this week if agitation continues to improve -9/10 will hold off on Eliquis due to sundowning at night, will see how he does with increase Geodon -9/11 EKG for bradycardia episode today, 2nd degree A-V block mobitz type 1.  Continue to hold beta blocker,9/11 will restart eliquis at 2.5 mg BID dose. Will consult cardiology regarding need for ASA/plavix and coreg.  Dr. Elease Hashimoto does not appear convinced that he has A-fib 9/12 appreciate cardiology consult yesterday, DAPT transition to daily aspirin 81 only and Eliquis was discontinued, caution with Geodon as this can contribute to  bradycardia 9/13 will decrease Geodon nighttime dose to 20 due to episodes of bradycardia 9/17 will discontinue Geodon due to variable cardiac effects 9/23 Heart rate stable in the 70s and 80s     04/08/2023    8:20 PM 04/08/2023    3:00 PM 04/08/2023    7:47 AM  Vitals with BMI  Systolic 143 145   Diastolic 80 70   Pulse  80 78       16: HFrEF: Daily weights. Monitor for signs of overload  - 9/7 continue daily weights, fluid level management per hemodialysis team Weight reviewed and stable  9/21- 94.3- down 3 kg-   9/22- no weight for today- too agitated per nursing [facility] Weights   04/05/23 1815 04/05/23 1820 04/07/23 1439  Weight: 94.3 kg 94.3 kg 90.7 kg      17: Liver nodularity on CT scan: LFTs reported to be normal. Avoid hepatotoxic medications    -Abdominal ultrasound earlier this admission with minimal ascites; ammonia within normal limits  -8/31: Did discuss with nephrology whether p.o. lactulose would be beneficial despite normal ammonia, after reviewing records unsure that this would be beneficial    18: Epistaxis - resolved             -previously improved with flonase and saline spray   19: Morbid Obesity. Dietary counseling Body mass index is 39.01 kg/m.  20. Bradycardic:  continue to monitor HR TID  -Discontinue Geodon  -9/19 heart rate in the 80s, stable  HR stable  21. HTN: resolved after HD, continue to monitor TID  9/19-20 intermittently elevated, continue to monitor.  Cautious with medication addition to avoid hypotension  9/21-9/22-  BP 140 -150 systolic- wait to add anything due to HD.   22. Hematuria  9/22- required in/out to check U/A- having new hematuria- nursing to keep an eye- since also bleeding from penis a little- monitor   9/23 no wBCs  on UA, follow  urine culture    LOS: 28 days A FACE TO FACE EVALUATION WAS PERFORMED  Fanny Dance 04/09/2023, 10:09 AM

## 2023-04-09 NOTE — Progress Notes (Signed)
[facility] Progress Note   Subjective:  Seen in room. Working with PT this am. Intermittent agitation and requiring sedation for dialysis. Was seen by palliative care over weekend  Seen by palliative care this weekend. Code status updated, continues dialysis    Objective Vitals:   04/08/23 1500 04/08/23 2000 04/08/23 2020 04/09/23 0809  BP: (!) 145/70  (!) 143/80   Pulse: 80     Resp: 18  18 18   Temp: 97.7 F (36.5 C)     TempSrc: Oral     SpO2: 99% 97% 90% 95%  Weight:      Height:       Physical Exam General: confused elderly male in NAD Heart: S1,S2 RRR Lungs: CTAB A/P Abdomen: NABS NT Extremities: No LE edema Dialysis Access: L AVG + T/B  Additional Objective Labs: Basic Metabolic Panel: Recent Labs  Lab 04/06/23 1330 04/08/23 1517  NA 135 139  K 4.1 3.9  CL 93* 96*  CO2 31 26  GLUCOSE 106* 105*  BUN 22 44*  CREATININE 7.07* 10.93*  CALCIUM 9.2 9.0  PHOS  --  4.5   Liver Function Tests: Recent Labs  Lab 04/06/23 1330 04/08/23 1517  AST 43*  --   ALT 34  --   ALKPHOS 86  --   BILITOT 1.0  --   PROT 7.0  --   ALBUMIN 2.6* 2.5*   No results for input(s): "LIPASE", "AMYLASE" in the last 168 hours. CBC: Recent Labs  Lab 04/08/23 1517  WBC 4.4  NEUTROABS 2.9  HGB 9.4*  HCT 31.5*  MCV 91.8  PLT 169   Blood Culture    Component Value Date/Time   SDES URINE, CATHETERIZED 04/08/2023 0915   SPECREQUEST NONE 04/08/2023 0915   CULT  04/08/2023 0915    CULTURE REINCUBATED FOR BETTER GROWTH Performed at [facility], [address], [place], [place] [address]    REPTSTATUS PENDING 04/08/2023 0915    Cardiac Enzymes: No results for input(s): "CKTOTAL", "CKMB", "CKMBINDEX", "TROPONINI" in the last 168 hours. CBG: No results for input(s): "GLUCAP" in the last 168 hours. Iron Studies: No results for input(s): "IRON", "TIBC", "TRANSFERRIN", "FERRITIN" in the last 72 hours. @lablastinr3 @ Studies/Results: No results  found. Medications:   (feeding supplement) PROSource Plus  30 mL Oral BID BM   ARIPiprazole  5 mg Oral QHS   aspirin EC  81 mg Oral Daily   atorvastatin  80 mg Oral Daily   darbepoetin (ARANESP) injection - DIALYSIS  150 mcg Subcutaneous Q Sun-1800   divalproex  125 mg Oral Q8H   feeding supplement  237 mL Oral BID WC   Gerhardt's butt cream  1 Application Topical QID   lidocaine  1 patch Transdermal Q2200   lidocaine  1 patch Transdermal Q24H   magnesium oxide  400 mg Oral Daily   mometasone-formoterol  2 puff Inhalation BID   pantoprazole  40 mg Oral BID   traZODone  50 mg Oral QHS     Dialysis Orders: Previously on [facility] thru [facility] - changed to HD - will need outpatient HD unit established prior to d/c. 4hr, 3K, EDW ~96, L AVF   Assessment/Plan: 1. Debility - in CIR 2. Encephalopathy/agitation: Waxing/waning agitation, dementia sundowning. Ammonia fine and brain MRI with no acute findings. He continues to be disruptive during HD. Needs sitter. But sitters are not allowed in the hospital HD unit. Have d/w HD staff - they state that most if not all of  his recent HD sessions have been problematic due to resistance and aggressive behavior. We have been using IV ativan to assist w/ getting him through dialysis. Use of IV Ativan can not be done in OP HD units. VIR  will not place cathter , agitation is not an indication to place cathter.  3. ESRD: Transitioned from PD to HD recently on TTS schedule. Using old AVF which is still functional, s/p f'gram 02/28/23. Next HD 9/24 4.Hypertension/volume: BP higher, midodrine 5mg  TID stopped 9/13. UF as tolerated.  5. Anemia: No recent labs. Draw in HD tomorrow.-  on Aranesp weekly. Last Tsat 38% with ferritin 2175.  No iron.  6. Metabolic bone disease: CorrCa ok, Phos low end on repeat - holding binders (renvela), no VDRA. 7.  Nutrition:  Alb very low, continue supps.  Renal diet w/fluid restrictions.  8.  Hx NSVT: On Coreg 10.   Recent culture negative PD cath-related peritonitis and associated fungemia + enterococcus bacteremia - underwent PD cath removal, TDC placement and removal, temp HD cath placement  and removal, and line holiday. Completed IV unasyn, IV micafungin and cipro on 8/15. TDC not replaced cause he had functional AVF.   11. GOC - Seen by palliative care - Now DNR/DNI wants to continue dialysis.  11. Dispo: For SNF placement. Regarding outpatient dialysis placement- per renal navigator he will need new referral since he has been gone from his prior clinic so long. At this time he is not appropriate for outpatient dialysis d/t ongoing agitation and  requiring IV sedation to complete treatments  Tomasa Blase PA-C [facility] 04/09/2023,10:30 AM

## 2023-04-09 NOTE — Progress Notes (Signed)
Daily Progress Note   Patient Name: [name] [name]       Date: 04/09/2023 DOB: 1951/07/16  Age: 72 y.o. MRN#: [id number] Attending Physician: Fanny Dance, MD Primary Care Physician: Benetta Spar, MD Admit Date: 03/12/2023  Reason for Consultation/Follow-up: Establishing goals of care   Length of Stay: 28  Current Medications: Scheduled Meds:   (feeding supplement) PROSource Plus  30 mL Oral BID BM   ARIPiprazole  5 mg Oral QHS   aspirin EC  81 mg Oral Daily   atorvastatin  80 mg Oral Daily   darbepoetin (ARANESP) injection - DIALYSIS  150 mcg Subcutaneous Q Sun-1800   divalproex  125 mg Oral Q8H   feeding supplement  237 mL Oral BID WC   Gerhardt's butt cream  1 Application Topical QID   lidocaine  1 patch Transdermal Q2200   lidocaine  1 patch Transdermal Q24H   magnesium oxide  400 mg Oral Daily   mometasone-formoterol  2 puff Inhalation BID   pantoprazole  40 mg Oral BID   traZODone  50 mg Oral QHS    Continuous Infusions:   PRN Meds: acetaminophen, bisacodyl, guaiFENesin-dextromethorphan, LORazepam **OR** LORazepam, Muscle Rub, ondansetron **OR** ondansetron (ZOFRAN) IV, oxyCODONE, traZODone  Physical Exam Vitals reviewed.  Cardiovascular:     Rate and Rhythm: Normal rate.  Pulmonary:     Effort: Pulmonary effort is normal.  Neurological:     Mental Status: He is alert. He is disoriented.  Psychiatric:        Mood and Affect: Mood normal.             Vital Signs: BP (!) 152/94   Pulse 69   Temp (!) 97.1 F (36.2 C)   Resp 17   Ht 5\' 4"  (1.626 m)   Wt 92.3 kg   SpO2 100%   BMI 34.93 kg/m  SpO2: SpO2: 100 % O2 Device: O2 Device: Room Air O2 Flow Rate: O2 Flow Rate (L/min): 2 L/min   Patient Active Problem List   Diagnosis Date Noted    Acute metabolic encephalopathy 03/12/2023   HFrEF (heart failure with reduced ejection fraction) (HCC) 03/12/2023   Morbid obesity (HCC) 03/12/2023   Encephalopathy 03/12/2023   Epistaxis 03/12/2023   PAF (paroxysmal atrial fibrillation) (HCC) 03/12/2023   Long term (current) use of insulin (  HCC) 03/12/2023   ESRD on dialysis (HCC) 03/12/2023   Debility 03/12/2023   Acute encephalopathy 02/15/2023   Gait abnormality 09/11/2022   Tremor 09/11/2022   Memory loss 09/11/2022   Hypertension associated with diabetes (HCC) 06/15/2022   Heart block AV second degree 06/15/2022   Peritoneal dialysis catheter in place [facility]) 06/14/2022   End stage renal disease on dialysis (HCC) 06/14/2022   Hyperglycemia 06/12/2022   AV block 06/07/2022   S/P CABG x 2 06/05/2022   Second degree AV block, Mobitz type I 12/27/2021   Unstable angina (HCC)    NSTEMI (non-ST elevated myocardial infarction) [facility])    Rectal bleeding 07/13/2021   Allergic rhinitis 04/07/2020   Asthma 10/02/2019   Pain in left foot 12/18/2018   Chest pain 08/29/2018   Constipation 02/28/2018   ESRD (end stage renal disease) (HCC) 11/13/2017   Foul smelling urine 12/14/2016   Coronary artery disease involving native coronary artery of native heart without angina pectoris 10/27/2016   Chronic kidney disease, stage IV (severe) (HCC) 10/27/2016   Morbid obesity due to excess calories (HCC) 03/22/2016   CAD (coronary artery disease)    Erectile dysfunction 08/16/2015   Hemorrhoids 07/05/2015   Vitamin D deficiency 06/04/2015   OSA on CPAP 05/14/2015   Normocytic anemia 03/24/2015   Fatty liver 12/01/2014   Back pain 06/05/2013   OA (osteoarthritis) of knee 03/15/2011   CTS (carpal tunnel syndrome) 03/15/2011   Type 2 diabetes mellitus with chronic kidney disease on chronic dialysis, with long-term current use of insulin (HCC) 09/29/2008   Hyperlipidemia 09/29/2008   Essential hypertension 09/29/2008   GERD 09/29/2008     Palliative Care Assessment & Plan   Patient Profile: 72 y.o. male  with past medical history of  CAD, PUD, DM, OAS, HLD, ESRD who was on PD which was removed on 02/19/23, LUE AVF in place, IR was consulted for image guided tunneled HD cath placement.    Patient was admitted to [facility] on 02/15/23 after presenting to [facility] ED with acute metabolic encephalopathy assumed due to recent Covid infection, PD catheter malfunctioning, later found to have sepsis secondary to peritoneal dialysis associated catheter peritonitis and polymicrobial bacteremia/fungemia, s/p PD removal on 02/19/23. Hospital course complicated by worsening encephalopathy with hypoxemia.    Patient had LUE AVF in place, he underwent fistulogram with balloon venoplasty on 8/14 by Dr. Karin Lieu, the fistula was used for HD treatment on 8/16. Patient was discharged to [facility] on 8/16, he was admitted to CIR on 03/12/23. In CIR, patient continued to be confused and agitated, psychiatry is following. Patient has been disruptive during HD at times, per nephrology note he was too combative for HD on 04/07/23, agitation appears to be made worse with cannulation of AVF.    Assessment: Patient is more alert today than yesterday. He is sitting in his wheelchair talking to the nurse tech. His creatinine was up to 10.93 yesterday up from 7.07 on 04/06/23. His wife shares that they have just drawn another set of labs before I entered. We discussed his inability to sit through and tolerate dialysis with his altered mental status. His wife describes his daily confusion and moments of clarity.   She is hopeful he will be able to tolerate dialysis planned for Tuesday. She asked if she could sit with him to help redirect him-- I checked and this is not allowed. She clearly understands he will be eligible for hospice if the dialysis cannot be figured out. Emotional support provided.  PMT will continue to support. Encouraged her to call with questions or  concerns.   Recommendations/Plan: DNR Limited scope of treatment- no intubation Time for outcomes-- hoping to find a way to continue dialysis Spiritual care consult- for supporting patient's wife Delirium precautions discussed with wife Continued PMT support   Code Status:    Code Status Orders  (From admission, onward)           Start     Ordered   04/08/23 1629  Do not attempt resuscitation (DNR)- Limited -Do Not Intubate (DNI)  (Code Status)  Continuous       Question Answer Comment  If pulseless and not breathing No CPR or chest compressions.   In Pre-Arrest Conditions (Patient Is Breathing and Has A Pulse) Do not intubate. Provide all appropriate non-invasive medical interventions. Avoid ICU transfer unless indicated or required.   Consent: Discussion documented in EHR or advanced directives reviewed      04/08/23 1629        Extensive chart review has been completed prior to seeing the patient and speaking with his wife including labs, vital signs, imagine, progress/consult notes, orders, medications, and available advance directive documents.  Time spent: 50 minutes  Thank you for allowing the Palliative Medicine Team to assist in the care of this patient.     Sherryll Burger, NP  Please contact Palliative Medicine Team phone at [phone] for questions and concerns.

## 2023-04-09 NOTE — Progress Notes (Signed)
Speech Language Pathology Daily Session Note  Patient Details  Name: [name] [name] MRN: [id number] Date of Birth: August 08, 1950  Today's Date: 04/09/2023 SLP Individual Time: 0300-0310 SLP Individual Time Calculation (min): 10 min and Today's Date: 04/09/2023 SLP Missed Time: 20 Minutes Missed Time Reason: Unavailable (Comment) (Pt taken for dialysis)  Short Term Goals: Week 4: SLP Short Term Goal 1 (Week 4): Patient will demonstrate intellectual awareness of deficits via verbally stating 2 cognitive and 2 physical impairments given modA SLP Short Term Goal 2 (Week 4): Patient will demonstrate use of recommended safe swallow strategies during mealtimes with modA. SLP Short Term Goal 3 (Week 4): Patient will utilize external aids to orient to time and situation given modA SLP Short Term Goal 4 (Week 4): Patient will participate in therapy sessions in 80% of opportunities given modA multimodal cues.  Skilled Therapeutic Interventions:  Pt was seen in PM to address cognitive re- training. Pt was alert and seated upright in WC upon SLP arrival. His wife and another family member were present and participating intermittently throughout. Pt reported pain however unable to distinguish location. Pt repetitively stating, "Come back tomorrow" "This is too much." Despite declining therapy, pt agreeable to allow clinician to sit own and talk. He answered biographical information with sup to min A mostly for encouragement. Transport team arrived to take pt fr dialysis therefore, pt missed 20 minutes of skilled intervention. SLP plan to make up minutes as able. Direct handoff to transport.  Pain Pain Assessment Pain Scale: 0-10 Pain Score: 7  Pain Type: Acute pain  Therapy/Group: Individual Therapy  Renaee Munda 04/09/2023, 3:27 PM

## 2023-04-09 NOTE — Progress Notes (Signed)
Patient confusion and agitation getting worse,not following commands due to confusion.Alert to self,he is getting restless in bed and his arms/hands were trying to pull HD circuit tubes and its cannulated sites. Renal P.A at the bedsides.Orders done and carried out.

## 2023-04-09 NOTE — Progress Notes (Signed)
Physical Therapy Session Note  Patient Details  Name: [name] [name] MRN: [id number] Date of Birth: 31-Jan-1951  Today's Date: 04/09/2023 PT Individual Time: 6295-2841 PT Individual Time Calculation (min): 47 min   Short Term Goals: Week 4:  PT Short Term Goal 1 (Week 4): Pt will complete transfers min assist consistently. PT Short Term Goal 2 (Week 4): Pt will complete gait 50' with min assist consistently PT Short Term Goal 3 (Week 4): Pt will complete up/down 1 step with min assist  Skilled Therapeutic Interventions/Progress Updates:    Pt presents in [facility] behind nurse's station, asleep, responds to verbal commands slowly and softly, falls asleep quickly again. Session focused on increasing alertness, gait training short distance, and redirection for agitated and inappropriate behaviors.  Pt transported to day room dependently via WC. Therapist assist with passive truncal mobility, tactile stimulation to promote improved alertness. Pt awakens briefly but unable to follow commands. Therapist then trials BLE passive ROM to promote improved blood flow and alertness with pt reporting needing to return to room to return to bed. Pt takes off shirt x3 times during session while in therapy gym, therapist redirects pt to attempt to reinforce appropriate behaviors with pt agreeable to donning shirt twice however requires total assist, does not follow verbal commands to assist with task. Pt attempts to stand to walk back to room, therapist provides min/mod assist for sit<>stand and standing balance however pt returns to sitting. Pt provided with cues for safety and pt able to ambulate 15' with R HHA min assist and 2nd person WC follow in case of immediate sit. Pt returned to room via WC. Pt positioned next to bed and provided with cues to complete stand pivot transfer however pt impulsively placing BUEs on mattress and becomes perseverative on needing to use the bathroom. Therapist provides urinal and pt  remains in a modified plantigrade position unable to assist with urinal placement, therapist providing cues that he is able to use restroom if needed however he does not. Therapist requires frequent max verbal cues to turn to sit safely on bed however pt resistant to lying down with perseverating needing to urinate. Therapist again attempts placing urinal while pt in sitting with pt unable to void. At this point pt wife enters room and pt returns to supine with max verbal cues, max assist, and max encouragement. RN notified of pt position and agitation and pt remains supine with bed rails up, bed alarm activated, telesitter in place, RN present and wife present at end of session.   Vitals: BP 112/68, HR 70, SpO2 100%  Therapy Documentation Precautions:  Precautions Precautions: Fall Restrictions Weight Bearing Restrictions: No   Therapy/Group: Individual Therapy  Edwin Cap PT, DPT 04/09/2023, 4:22 PM

## 2023-04-09 NOTE — Progress Notes (Signed)
Brought in by his nurse into the HD unit,alert to person,barely following commands. Need to hold his hands while cannulating the hd access.  Access used; Left upper AVF that beeps a lot due to patient's restlessness in bed.  Duration of treatment: 2 .50 hours  Fluid removed : 1,200 cc  Hemodialysis comment: Needs to have non -violent restraint due to patient's confusion,restless and agitation.Not following commands.                                         As his treatment went on, no improvement on patient's mental status,minimal help from restraint,he was al over in his bed,trying to sit-up and was trying to roll over the sides-rails,in doing so there was a risk of accidentally pulling the needles from the sites,besides his treatment was not moving as time plan due to a lot of beeping from the machine as a result of his restless movement .  Hand off to the patient nurse. With a help from a CN ,brought patient back into his room.Discontinued all his restraint as ordered.Unit's nurses and day's nurse  made aware that patient was back.

## 2023-04-09 NOTE — Progress Notes (Signed)
Occupational Therapy Session Note  Patient Details  Name: [name] [name] MRN: [id number] Date of Birth: [birth year]  Today's Date: 04/09/2023 OT Individual Time: 1300-1345 OT Individual Time Calculation (min): 45 min    Short Term Goals: Week 4:  OT Short Term Goal 1 (Week 4): To continue to consistently meet LTG.  Skilled Therapeutic Interventions/Progress Updates:    Pt greeted seated in wc with spouse present. Per conversation with spouse, she gave him a shower today. OT educated on difficulties we have had with showering as pt has been refusing to shower or even bathe within OT session. Pt brought to therapy gym and worked on hand eye coordination and initiation with beach ball toss. OT able to get pt to catch ball on 50% of opportunities with moderate cues. Pt then ambulated in gym with min A, but impulsive to sit and impulsive. Tried to engage pt in UB there-ex, but kept perseverating on, "let's get this thing moving." But would not actually initiate. Pt returned to room and left seated in wc with alarm belt on, call bell in reach, and needs met.   Therapy Documentation Precautions:  Precautions Precautions: Fall Restrictions Weight Bearing Restrictions: No Pain:  Denies pain   Therapy/Group: Individual Therapy  [name] 04/09/2023, 1:37 PM

## 2023-04-09 NOTE — Progress Notes (Addendum)
Patient ID: [name] [name], male   DOB: 1950/07/27, 72 y.o.   MRN: [id number]  SW sent out SNF referral.   Working on NCPASSR#- pending at this time.  *escalated to level II and waiting on follow-up.   Cecile Sheerer, MSW, LCSWA Office: [phone] Cell: [phone] Fax: [phone]

## 2023-04-09 NOTE — NC FL2 (Signed)
[place] MEDICAID FL2 LEVEL OF CARE FORM     IDENTIFICATION  Patient Name: [name] [name] Birthdate: [birth year] Sex: male Admission Date (Current Location): 03/12/2023  [facility] and [place] Number:  Producer, television/film/video and Address:  [facility] [place], [address], [place], [place] [address]      Provider Number: [id number]  Attending Physician Name and Address:  Fanny Dance, MD  Relative Name and Phone Number:       Current Level of Care: Hospital Recommended Level of Care: Skilled Nursing Facility Prior Approval Number:    Date Approved/Denied:   PASRR Number:    Discharge Plan: SNF    Current Diagnoses: Patient Active Problem List   Diagnosis Date Noted   Acute metabolic encephalopathy 03/12/2023   HFrEF (heart failure with reduced ejection fraction) (HCC) 03/12/2023   Morbid obesity (HCC) 03/12/2023   Encephalopathy 03/12/2023   Epistaxis 03/12/2023   PAF (paroxysmal atrial fibrillation) (HCC) 03/12/2023   Long term (current) use of insulin (HCC) 03/12/2023   ESRD on dialysis (HCC) 03/12/2023   Debility 03/12/2023   Acute encephalopathy 02/15/2023   Gait abnormality 09/11/2022   Tremor 09/11/2022   Memory loss 09/11/2022   Hypertension associated with diabetes (HCC) 06/15/2022   Heart block AV second degree 06/15/2022   Peritoneal dialysis catheter in place [facility]) 06/14/2022   End stage renal disease on dialysis (HCC) 06/14/2022   Hyperglycemia 06/12/2022   AV block 06/07/2022   S/P CABG x 2 06/05/2022   Second degree AV block, Mobitz type I 12/27/2021   Unstable angina (HCC)    NSTEMI (non-ST elevated myocardial infarction) (HCC)    Rectal bleeding 07/13/2021   Allergic rhinitis 04/07/2020   Asthma 10/02/2019   Pain in left foot 12/18/2018   Chest pain 08/29/2018   Constipation 02/28/2018   ESRD (end stage renal disease) (HCC) 11/13/2017   Foul smelling urine 12/14/2016   Coronary artery disease involving native coronary  artery of native heart without angina pectoris 10/27/2016   Chronic kidney disease, stage IV (severe) (HCC) 10/27/2016   Morbid obesity due to excess calories (HCC) 03/22/2016   CAD (coronary artery disease)    Erectile dysfunction 08/16/2015   Hemorrhoids 07/05/2015   Vitamin D deficiency 06/04/2015   OSA on CPAP 05/14/2015   Normocytic anemia 03/24/2015   Fatty liver 12/01/2014   Back pain 06/05/2013   OA (osteoarthritis) of knee 03/15/2011   CTS (carpal tunnel syndrome) 03/15/2011   Type 2 diabetes mellitus with chronic kidney disease on chronic dialysis, with long-term current use of insulin (HCC) 09/29/2008   Hyperlipidemia 09/29/2008   Essential hypertension 09/29/2008   GERD 09/29/2008    Orientation RESPIRATION BLADDER Height & Weight     Self  Normal Continent Weight: 199 lb 15.3 oz (90.7 kg) Height:  5\' 4"  (162.6 cm)  BEHAVIORAL SYMPTOMS/MOOD NEUROLOGICAL BOWEL NUTRITION STATUS      Continent Diet  AMBULATORY STATUS COMMUNICATION OF NEEDS Skin   Supervision Verbally Normal                       Personal Care Assistance Level of Assistance  Bathing, Feeding, Dressing Bathing Assistance: Limited assistance Feeding assistance: Limited assistance Dressing Assistance: Limited assistance     Functional Limitations Info  Sight, Hearing, Speech Sight Info: Adequate Hearing Info: Adequate Speech Info: Adequate    SPECIAL CARE FACTORS FREQUENCY  PT (By licensed PT), OT (By licensed OT), Speech therapy     PT Frequency:  5xs per week OT Frequency: 5xs per week     Speech Therapy Frequency: 5xs per week      Contractures Contractures Info: Not present    Additional Factors Info  Code Status, Allergies, Psychotropic Code Status Info: DNR-limited Allergies Info: See discharge instructions Psychotropic Info: See discharge instructions         Current Medications (04/09/2023):  This is the current hospital active medication list Current  Facility-Administered Medications  Medication Dose Route Frequency Provider Last Rate Last Admin   (feeding supplement) PROSource Plus liquid 30 mL  30 mL Oral BID BM Julien Nordmann, PA-C   30 mL at 04/06/23 1450   acetaminophen (TYLENOL) tablet 325-650 mg  325-650 mg Oral Q4H PRN Milinda Antis, PA-C   650 mg at 04/07/23 1650   ARIPiprazole (ABILIFY) tablet 5 mg  5 mg Oral QHS Fanny Dance, MD   5 mg at 04/08/23 2127   aspirin EC tablet 81 mg  81 mg Oral Daily Milinda Antis, PA-C   81 mg at 04/09/23 1610   atorvastatin (LIPITOR) tablet 80 mg  80 mg Oral Daily Milinda Antis, PA-C   80 mg at 04/09/23 9604   bisacodyl (DULCOLAX) EC tablet 5 mg  5 mg Oral Daily PRN Milinda Antis, PA-C       Darbepoetin Alfa (ARANESP) injection 150 mcg  150 mcg Subcutaneous Q Sun-1800 Paytes, Austin A, RPH   150 mcg at 04/08/23 1714   divalproex (DEPAKOTE SPRINKLE) capsule 125 mg  125 mg Oral Q8H Fanny Dance, MD       feeding supplement (ENSURE ENLIVE / ENSURE PLUS) liquid 237 mL  237 mL Oral BID WC Milinda Antis, PA-C   237 mL at 04/06/23 1759   Gerhardt's butt cream 1 Application  1 Application Topical QID Milinda Antis, PA-C   1 Application at 04/08/23 2128   guaiFENesin-dextromethorphan (ROBITUSSIN DM) 100-10 MG/5ML syrup 10 mL  10 mL Oral Q6H PRN Milinda Antis, PA-C   10 mL at 04/03/23 1633   lidocaine (LIDODERM) 5 % 1 patch  1 patch Transdermal Q2200 Milinda Antis, PA-C   1 patch at 03/31/23 2136   lidocaine (LIDODERM) 5 % 1 patch  1 patch Transdermal Q24H [name], MD   1 patch at 04/01/23 1955   LORazepam (ATIVAN) tablet 0.5 mg  0.5 mg Oral Q6H PRN Pola Corn, NP   0.5 mg at 04/09/23 5409   Or   LORazepam (ATIVAN) injection 0.5 mg  0.5 mg Intramuscular Q6H PRN Pola Corn, NP       magnesium oxide (MAG-OX) tablet 400 mg  400 mg Oral Daily Milinda Antis, PA-C   400 mg at 04/09/23 8119   mometasone-formoterol (DULERA) 200-5 MCG/ACT inhaler 2  puff  2 puff Inhalation BID Milinda Antis, PA-C   2 puff at 04/09/23 0809   Muscle Rub CREA   Topical PRN Fanny Dance, MD       ondansetron [name]) tablet 4 mg  4 mg Oral Q6H PRN Milinda Antis, PA-C   4 mg at 03/16/23 2154   Or   ondansetron (ZOFRAN) injection 4 mg  4 mg Intravenous Q6H PRN Milinda Antis, PA-C   4 mg at 04/03/23 1603   oxyCODONE (Oxy IR/ROXICODONE) immediate release tablet 2.5 mg  2.5 mg Oral TID PRN Elijah Birk C, DO   2.5 mg at 04/07/23 1650   pantoprazole (PROTONIX) EC tablet 40 mg  40 mg Oral BID Milinda Antis, PA-C   40 mg at 04/09/23 2841   traZODone (DESYREL) tablet 50 mg  50 mg Oral QHS PRN Ranelle Oyster, MD   50 mg at 04/05/23 2240   traZODone (DESYREL) tablet 50 mg  50 mg Oral QHS Ranelle Oyster, MD   50 mg at 04/08/23 2127     Discharge Medications: Please see discharge summary for a list of discharge medications.  Relevant Imaging Results:  Relevant Lab Results:   Additional Information [id number]; Dialysis TTS at [facility].  Gretchen Short, LCSW

## 2023-04-10 LAB — URINE CULTURE: Culture: >=100000 — AB

## 2023-04-10 MED ORDER — CHLORHEXIDINE GLUCONATE CLOTH 2 % EX PADS: 6.0000 | MEDICATED_PAD | Freq: Every day | CUTANEOUS | Status: DC

## 2023-04-10 MED ORDER — MEMANTINE HCL 10 MG PO TABS
5.0000 mg | ORAL_TABLET | Freq: Every day | ORAL | Status: DC
  Administered 2023-04-10 – 2023-04-18 (×9): 5 mg via ORAL
  Filled 2023-04-10 (×8): qty 1
  Filled 2023-04-10: qty 0.5

## 2023-04-10 NOTE — Progress Notes (Signed)
Patient ID: [name] [name], male   DOB: 1951/07/09, 72 y.o.   MRN: [id number]  Team Conference Report to Patient/Family  Team Conference discussion was reviewed with the patient and caregiver, including goals, any changes in plan of care and target discharge date.  Patient and caregiver express understanding and are in agreement.  The patient has a target discharge date of  (SNF).  Covering for primary SW, Auria C.   Sw spoke with pt's spouse, [name]. Sw informed mary that primary Sw anticipates reaching out to her tomorrow to discuss SNF acceptances and obtain her preferences. No additional questions or concerns.   Andria Rhein 04/10/2023, 11:17 AM

## 2023-04-10 NOTE — Patient Care Conference (Signed)
Inpatient RehabilitationTeam Conference and Plan of Care Update Date: 04/10/2023   Time: 10:32 AM    Patient Name: [name] [name]      Medical Record Number: [id number]  Date of Birth: 1951-03-09 Sex: Male         Room/Bed: 4W13C/4W13C-01 Payor Info: Payor: MEDICARE / Plan: MEDICARE PART A AND B / Product Type: *No Product type* /    Admit Date/Time:  03/12/2023  4:25 PM  Primary Diagnosis:  Acute metabolic encephalopathy  Hospital Problems: Principal Problem:   Acute metabolic encephalopathy Active Problems:   Type 2 diabetes mellitus with chronic kidney disease on chronic dialysis, with long-term current use of insulin (HCC)   HFrEF (heart failure with reduced ejection fraction) (HCC)   Morbid obesity (HCC)   Encephalopathy   Epistaxis   PAF (paroxysmal atrial fibrillation) (HCC)   Long term (current) use of insulin (HCC)   ESRD on dialysis [facility])   Debility    Expected Discharge Date: Expected Discharge Date:  (SNF)  Team Members Present: Physician leading conference: Dr. Faith Rogue Social Worker Present: Lavera Guise, BSW Nurse Present: Vedia Pereyra, RN PT Present: Darrold Span, PT OT Present: Kearney Hard, OT SLP Present: Feliberto Gottron, SLP PPS Coordinator present : Fae Pippin, SLP     Current Status/Progress Goal Weekly Team Focus  Bowel/Bladder   cont of B&B LBM unknown due to wife toileting pt   remain cont   assist with toileting prn    Swallow/Nutrition/ Hydration   regular/ thin with intermittent supervision   Supervision  contiue diet tolerance and use of strategies    ADL's   Can be as little as min A, but max to total when he does not want to actively participate in BADL tasks   Goals downgraded to mod A overall   barriers to dc are continued confusion and agitation, poor safety awareness    Mobility   min/modA overall, increased lethargy and agitation   minA ambulatory household distances, WC for community  barreirs:  increased agitation and lethargy, focus on gait training, global strengthening and endurance, following verbal cues    Communication                Safety/Cognition/ Behavioral Observations  Mod-Max A overall for cognitiion but can fluctuate depending on arousal, fatigue, etc   Mod A   orientation with use of memory aids, basic problem solving, safety awareness    Pain   generalized pain reported, controlled with current medications   pain remain in conroll   assess qshift and prn    Skin   skin intact   remain intact  assess qshift and prn      Discharge Planning:  D/c plan remains SNF placement as wife is unable to provide 24/7 care due to working twice a week, and other responsibilities. SNF referral sent out on 9/23 as behaviors will continue. SW will continue to confirm there are no barriers to discharge.   Team Discussion: Acute metabolic encephalopathy.  Behavior Plan.  Tele-sitter.  Remains restless and continues with hallucinations at night.  HD T/Th/Sat. Behaviors are redirectable during the day hours.  Using alternative safety measures.  Sleep chart. Daily weight. Tolerating renal carb mod diet with 1200 fluid restriction.  AC/HS. Better today with therapy.  Increased rigidity noted during therapy. Participation with therapy depends on behaviors. Therapies are every day.  Patient on target to meet rehab goals: Home vs SNF placement  *See Care Plan and progress notes for long  and short-term goals.   Revisions to Treatment Plan:  Namenda started.  Monitor labs/VS.  Behavior plan reviewed.   Teaching Needs: Medications, safety, self care, gait/transfer training, behavior/mood modifications, etc.   Current Barriers to Discharge: Decreased caregiver support, Hemodialysis, Medication compliance, and Behavior  Possible Resolutions to Barriers: Alternative safety measures used to maintain safe environment Placement VS home Compliant with medications Able to self  monitor behaviors/mood independently Order recommended DME if needed.     Medical Summary Current Status: ongoing cognitive-behavioral issues. hallucinating at night. remains on HD. sugars under control  Barriers to Discharge: Medical stability;Behavior/Mood  Barriers to Discharge Comments: metabolic encephalopathy, behavior, ESRD, BP control Possible Resolutions to [name]: daily assessment and adjustment of behavior and treatment regimen. HD after therapy to allow participation.   Continued Need for Acute Rehabilitation Level of Care: The patient requires daily medical management by a physician with specialized training in physical medicine and rehabilitation for the following reasons: Direction of a multidisciplinary physical rehabilitation program to maximize functional independence : Yes Medical management of patient stability for increased activity during participation in an intensive rehabilitation regime.: Yes Analysis of laboratory values and/or radiology reports with any subsequent need for medication adjustment and/or medical intervention. : Yes   I attest that I was present, lead the team conference, and concur with the assessment and plan of the team.   [name] 04/11/2023, 8:31 AM

## 2023-04-10 NOTE — Progress Notes (Addendum)
Patients wife toileted him and noticed light red blood when patient was urinating.   Charlott Calvario [place]

## 2023-04-10 NOTE — Progress Notes (Signed)
PROGRESS NOTE   Subjective/Complaints:  Patient continued to have episodes of restlessness.  Dialysis was terminated early yesterday due to agitation.  TFT:DDUKGUR due to cognition/behavioral   Objective:   No results found. Recent Labs    04/08/23 1517 04/09/23 1219  WBC 4.4 3.9*  HGB 9.4* 9.6*  HCT 31.5* 32.3*  PLT 169 185      Recent Labs    04/08/23 1517 04/09/23 1219  NA 139 138  K 3.9 4.1  CL 96* 99  CO2 26 26  GLUCOSE 105* 100*  BUN 44* 51*  CREATININE 10.93* 12.78*  CALCIUM 9.0 9.0       Intake/Output Summary (Last 24 hours) at 04/10/2023 0949 Last data filed at 04/10/2023 0752 Gross per 24 hour  Intake 360 ml  Output 1200 ml  Net -840 ml          Physical Exam: Vital Signs Blood pressure (!) 131/95, pulse 100, temperature 98 F (36.7 C), resp. rate 17, height 5\' 4"  (1.626 m), weight 91.1 kg, SpO2 97%.     General: NAD, sitting at nurses station HENT: conjugate gaze; oropharynx moist CV: regular rate; no JVD Pulmonary: CTA B/L; no W/R/R- good air movement GI: soft, NT, ND, (+)BS Psychiatric: inappropriate, confused Neurological: Calm sitting at nurses station  Ext: no clubbing, cyanosis, or edema Psych: Confused, not agitated at this time Neurologic Exam:   Less drowsy this morning Oriented to self, rehabilitation center + Memory deficit - ongoing  Sensory exam: Intact to touch in all 4 extremities Motor exam: moves all 4 extremities without limitiations Coordination: Slight bilateral upper extremity tremor, intention, mild.    Musculoskeletal: No joint swelling noted No abnormal tone      Assessment/Plan: 1. Functional deficits which require 15 hr per week of interdisciplinary therapy in a comprehensive inpatient rehab setting. Physiatrist is providing close team supervision and 24 hour management of active medical problems listed below. Physiatrist and rehab team  continue to assess barriers to discharge/monitor patient progress toward functional and medical goals  Care Tool:  Bathing    Body parts bathed by patient: Right arm, Left arm, Chest, Abdomen, Front perineal area, Right upper leg, Buttocks, Left upper leg, Right lower leg, Left lower leg, Face         Bathing assist Assist Level: Moderate Assistance - Patient 50 - 74%     Upper Body Dressing/Undressing Upper body dressing   What is the patient wearing?: Pull over shirt    Upper body assist Assist Level: Minimal Assistance - Patient > 75%    Lower Body Dressing/Undressing      Lower body dressing      What is the patient wearing?: Underwear/pull up, Pants     Lower body assist Assist for lower body dressing: Maximal Assistance - Patient 25 - 49%     Toileting Toileting    Toileting assist Assist for toileting: Maximal Assistance - Patient 25 - 49%     Transfers Chair/bed transfer  Transfers assist     Chair/bed transfer assist level: Maximal Assistance - Patient 25 - 49%     Locomotion Ambulation   Ambulation assist      Assist  level: Minimal Assistance - Patient > 75% Assistive device: No Device Max distance: 175'   Walk 10 feet activity   Assist     Assist level: Contact Guard/Touching assist Assistive device: No Device   Walk 50 feet activity   Assist Walk 50 feet with 2 turns activity did not occur: Safety/medical concerns  Assist level: Minimal Assistance - Patient > 75% Assistive device: No Device    Walk 150 feet activity   Assist Walk 150 feet activity did not occur: Safety/medical concerns  Assist level: Minimal Assistance - Patient > 75% Assistive device: No Device    Walk 10 feet on uneven surface  activity   Assist Walk 10 feet on uneven surfaces activity did not occur: Safety/medical concerns         Wheelchair     Assist Is the patient using a wheelchair?: Yes Type of Wheelchair: Manual     Wheelchair assist level: Dependent - Patient 0%      Wheelchair 50 feet with 2 turns activity    Assist        Assist Level: Dependent - Patient 0%   Wheelchair 150 feet activity     Assist      Assist Level: Dependent - Patient 0%   Blood pressure (!) 131/95, pulse 100, temperature 98 F (36.7 C), resp. rate 17, height 5\' 4"  (1.626 m), weight 91.1 kg, SpO2 97%.  Medical Problem List and Plan: 1. Functional deficits secondary to metabolic encephalopathy due to sepsis secondary to catheter peritonitis and hypoxemia              -patient may not shower             -ELOS/Goals: 16-18 days, PT/OT Sup, SLP min A -tentative discharge date 9-9           -Continue CIR therapies including PT, OT, and SLP   -Will review records from [facility] -- has worsened since approximately 8/22, possible 8/19, consistent with records from nursing at [facility] with current behavior.   -Therapy QD schedule  -Therapy suggest having patient set up for short time before his therapy sessions to help him wake up.  I think this would be a good idea  Con't CIR PT, OT and SLP  Patient was seen by palliative care, changed to DNR status  -Team conference today please see physician documentation under team conference tab  -Social work has sent out SNF referral  2.  Antithrombotics: -DVT/anticoagulation:  Pharmaceutical: Heparin, 03/23/23 changed to Lovenox yesterday to help improved compliance             -antiplatelet therapy: aspirin and Plavix   3. Pain Management: Tylenol as needed             -Lidoderm patches to back  -Elavil 25 mg at bedtime for nerve pain and sleep 8/27  -8/29: Pain well controlled on Elavil; DC lyrica, PRN Tylenol/oxy 2.5-5 mg d/t CKD  8-30: Appears patient has been getting pain medications together overnight along with antipsychotics, will discuss with nursing  8-31: Patient having an difficulty endorsing needs and not utilizing PRNs, scheduled oxycodone 5 mg 3 times  daily and has 2.5 mg prn 3 times daily available.   9-1: Not utilizing PRNs, minimal complaints of pain, will discontinue scheduled given lethargy today..  -muscle rub R shoulder  9/16 patient reports pain is under control overall   9/24 no complaints of pain this morning 4. Mood/Behavior/Sleep: LCSW to evaluate and provide emotional support             -  continue Seroquel 50 mg TID             -clonazepam 0.5 mg TID prn             -Geodon 10 mg IM q 12 hours prn             -antipsychotic agents: n/a  -8-27: Agitated by soft restraints in bed, transition to 4 side rails, Posey alarm, and TeleSitter for now.  Veil bed on standby if needed, however feel we should avoid this if possible due to likelihood of agitating patient further   - 8/28: Placed in veil bed; wrist restraints for dialysis. Scheduled Geodon 20 mg twice daily; discontinue Seroquel and clonazepam due to no perceivable benefit. Add as needed Zyprexa 5 mg twice daily p.o. or IM due to recent cardiac arrhythmias and concern for QTc prolongation. As needed ramelteon for sleep. Stop gabapentin due to possible side effect of agitation  - 8/28: No improvement except temporarily with Geodon; add depakote 125 mg q8h for ongoing aggitation, mood stabilization. Increase elavil to 50 mg at bedtime.  Workup ongoing.    - 8/29: EEG negative, labs normal. MRI pending. Sleeping well and calm since starting depakote and increase in elavil. Will monitor tonight and wean Geodon in AM if appropriate.   8-30: MRI showing chronic microvascular changes, otherwise normal.  Increasingly calm and appropriate during the day, with ongoing agitation at nighttime.  Will increase nighttime Geodon to 40 mg; will discuss sleep log with nursing, as it has not been performed.  8-31 no sleep overnight per sleep log, again getting agitated around 9 PM and 5 AM.  Not responsive to as needed Zyprexa, responsive to Ativan.  Attempted to wean off scheduled a.m. Geodon today,  but patient became extremely agitated and unable to redirect, calm down with readministration of medication.  Resume Geodon 20 mg every morning/40 mg every afternoon, increased Elavil to 75 mg nightly, adding trazodone 50 mg nightly as needed.  P.o./IV Ativan 0.5 mg every 6 hours for agitation.  9-1: slept well overnight with trazodone and increased Elavil, no PRNs used.  Unfortunately, oversedated today.  Discontinue scheduled pain medications as above, start wean of Elavil to 50 mg x2 days -> 25 mg x2 days -> 10 mg x2 days, off due to no apparent benefit and no complaints of pain.  Reduce Geodon from 20 mg every morning/40 mg every evening to 20 mg twice daily.  Continue trazodone as needed for sleep, as this seems to be the difference for a restful night yesterday.  If he remains overly sedated, would dose reduce and DC Depakote next.  Keep Ativan as needed d/t dialysis not having other options available.  9/2-3--non agitated but still sleepy   -dc'ed depakote as I doubt it's doing much at this dose anyway   -continue geodon at 20mg  bid   -scheduled trazodone at HS with back up dose if needed    -sleep was intermittent last night   -requires enclosure bed for safety  9/4 continue enclosure bed for now, consider trying DC of this device early next week  -9/5 appears to be doing better overall this morning, continues to have intermittent agitation at times.  Continue enclosure bed  -9/6 agitation appears to be improving, patient is skeptical about medications and has been declining some of these.  Will discontinue multivitamin, RisaQuad to try to decrease pill burden  -9/7 continues to have intermittent episodes of agitation however overall improving, appears to be taking  most of his medications  9/9 agitation and mental status appears to be improving -discussing discontinuation of enclosure bed with team 9/10 Geodon dose increased to 40 mg at night, continue 20 mg in the morning 9/11 No acute events  noted, will check with team regarding DC enclosure bed, nursing reports he did ok last night but had dialysis 9/12 continues to have sundowning/waxing waning confusion and agitation, continue enclosure bed for now, will caution with Geodon will continue current dose but hold off on any escalation as this can contribute to bradycardia 9/16 no longer using enclosure bed, continue Geodon 20 mg twice daily for now due to intermittent agitation 9/17 patient with agitation and confusion at night, has sedation during the day.  Discussed with psychiatry Dr. Gasper Sells.  Will discontinue Geodon and try Abilify tonight.  Dr. Gasper Sells also suggested considering donepezil to shorten hallucinations of delirium.  Psychiatry consult placed, will see tomorrow.  Appreciate assistance 9/18 no behavioral related notes overnight, psychiatry plans to see him today.  Appreciate assistance 9/19 patient was seen by psychiatry yesterday, recommended continue Abilify, consider donepezil or memantine, suggested avoiding stimulants.  Will continue current regimen for now as it appears he is doing better.  He appears to be less sedated and did not see frequent notes of agitation but this regimen thus far. 9/20 recheck CMP/ammonia/VPA level today due to continued drowsiness  9/21- Depakote level 16- too low- Ammonia 19- low- asleep this AM, but haven't been called today about sedation/lethargy.  9/22- more hallucinations per nursing- labs attempted- couldn't get- couldn't get U/A results since so much hematuria. Trying to get labs, to see if getting ill vs missing HD? 9/23 will go back to 3 times daily for Depakote to help avoid agitation 9/24 continue current regimen with Abilify and start memantine 5 mg daily 5. Neuropsych/cognition: This patient is not capable of making decisions on his own behalf   -recommend neuropsych consult   - 8/27: Ongoing aggitation; Consult hospitalist for further workup on potential contributors to  encephalopathy. Pending UA, LA 2.1-> 1.4; otherwise negative so far -Delirium precautions, sleep log, Veiled bed for safety and to minimize restraints -Wife has expressed that she is not interested in palliative care at this time; hopefully with above measures patient can get dialysis in the next 48 hours to limit worsening uremic encephalopathy and medical decline    - 8/29: Aggitation much improved, able to get some dialysis last night and today. See above for medication changes. Calling wife tonight to discuss.   8-30: Still with moderate cognitive deficits, however appears once patient is less agitated he is better able to concentrate and has better orientation.  MRI negative for acute findings, monitor.  9/3 agitation better. More sedated than anything at this point. Confused as well.    -depakote stopped   -continue other meds as rx'ed  9/21- pt refused Depakote this AM "since doesn't have seizures".   9/22- per nursing, attempted 3 different times yesterday and today to give Depakote 6. Skin/Wound Care: Routine skin care checks             -sacral skin off-loading/protection   7. Fluids/Electrolytes/Nutrition: Strict Is and Os and follow-up chemistries per nephrology             -renal diet/ 1200 cc FR  - Renvela 800 mg 3 times daily for hyperphosphatemia added per nephrology 8-27   9/21- Last Cr 7.07 and BUN 22 8: Hypotension: monitor TID and prn             -  continue midodrine as needed during dialysis  Normotensive, monitor  9-1: Consistently 90s over 60s, has been stable but given findings of microvascular changes on MRI, may be too low for patient to perfuse properly.  Will add midodrine 5 mg tid and monitor.  DC Coreg as heart rate has been low to normal; may resume at low-dose once blood pressure improved.   9/2 continue with ccurrent regimen. Bp's hr remain low   -elavil wean should help  9/9-10 BP stable, continue midodrine and monitor  9/13 DC midodrine today as BP has been  higher, can restart if needed  Improved, cont to monitor  9/24 BP stable, continue current regimen    04/10/2023    2:59 AM 04/09/2023    7:31 PM 04/09/2023    7:00 PM  Vitals with BMI  Weight   200 lbs 13 oz  BMI   34.46  Systolic 131 160   Diastolic 95 77   Pulse 100 71       9: Hyperlipidemia: continue statin   10: CAD s/p CABG 2023: on DAPT and statin.  - troponin elevated but stable, monitor 8/27   11: DM-2: A1c = 6.3% (at home on Lantus 22 units at bedtime, Tradjenta 5 mg daily)        -monitor PO intake             -continue SSI (0-6 units) does not appear to be requiring coverage   - 9/6 DC SSI, change CBGs to daily  9/8 discontinue CBGs  9/16 glucose still controlled on BMP yesterday   9/23 glucose 100 on BMP yesterday  12: ESRD: HD on Tues, Thurs, Sat -nephrology made aware   - Unable to tolerate 8/27 d/t aggitation, stopped early 8/28;  - attempting again 8/29, stopped early again -Per nephrology, redirectable with gum -Have to switch restraints during dialysis sessions from veil bed to regular bed, then resume veil bed order once at rehab -9/9 tolerating HD better 9/18 patient's wife indicates she would be able to stay with him during dialysis after discharge   9/22- pt refused/too agitated ot get HD yesterday  9/24 nephrology does not feel like he is a candidate for outpatient dialysis at this time due to agitation 13: COPD/OSA; chronic home 2L O2 (Symbicort, Ventolin, prednisone 40 mg daily at home)             -continue symbicort  -Added home oxygen back 8-27   14: GERD: continue Protonix   15: Non-sustained VT/pAF/bradycardia: per cardiology, Dr. Algie Coffer: -continue carvedilol 6.25 mg BID; consider increasing to 12.5 mg BID before starting low dose amiodarone  -consider Eliquis and discontinue asa and Plavix in one week if no active GI bleeding and stable hemoglobin from 8/24 -hemoglobin has remained stable but very low around 7, no recurrent A-fib, monitor  for now given agitation/fall 8-31 and consider initiating Eliquis when safe -Continue mag-ox to avoid hypomagnesemia -8-27: Single instance of bradycardia this morning 40s, may have been missed read, monitor through today and do not further adjust beta-blocker medications  -9-3 hr controlled after holding coreg -9/4-5 HR stable, continue to monitor -9/9 consider Eliquis this week if agitation continues to improve -9/10 will hold off on Eliquis due to sundowning at night, will see how he does with increase Geodon -9/11 EKG for bradycardia episode today, 2nd degree A-V block mobitz type 1.  Continue to hold beta blocker,9/11 will restart eliquis at 2.5 mg BID dose. Will consult cardiology regarding need for ASA/plavix  and coreg.  Dr. Elease Hashimoto does not appear convinced that he has A-fib 9/12 appreciate cardiology consult yesterday, DAPT transition to daily aspirin 81 only and Eliquis was discontinued, caution with Geodon as this can contribute to bradycardia 9/13 will decrease Geodon nighttime dose to 20 due to episodes of bradycardia 9/17 will discontinue Geodon due to variable cardiac effects 9/24 bradycardia continues to be improved after Geodon discontinued     04/10/2023    2:59 AM 04/09/2023    7:31 PM 04/09/2023    7:00 PM  Vitals with BMI  Weight   200 lbs 13 oz  BMI   34.46  Systolic 131 160   Diastolic 95 77   Pulse 100 71        16: HFrEF: Daily weights. Monitor for signs of overload  - 9/7 continue daily weights, fluid level management per hemodialysis team Weight reviewed and stable  9/21- 94.3- down 3 kg-   9/22- no weight for today- too agitated per nursing [facility] Weights   04/07/23 1439 04/09/23 1545 04/09/23 1900  Weight: 90.7 kg 92.3 kg 91.1 kg      17: Liver nodularity on CT scan: LFTs reported to be normal. Avoid hepatotoxic medications    -Abdominal ultrasound earlier this admission with minimal ascites; ammonia within normal limits  -8/31: Did discuss with  nephrology whether p.o. lactulose would be beneficial despite normal ammonia, after reviewing records unsure that this would be beneficial    18: Epistaxis - resolved             -previously improved with flonase and saline spray   19: Morbid Obesity. Dietary counseling Body mass index is 39.01 kg/m.  20. Bradycardic: continue to monitor HR TID  -Discontinue Geodon  -9/19 heart rate in the 80s, stable  HR stable  21. HTN: resolved after HD, continue to monitor TID  9/19-20 intermittently elevated, continue to monitor.  Cautious with medication addition to avoid hypotension  9/21-9/22-  BP 140 -150 systolic- wait to add anything due to HD.    22. Hematuria  9/22- required in/out to check U/A- having new hematuria- nursing to keep an eye- since also bleeding from penis a little- monitor   9/23 no wBCs  on UA, follow  urine culture-Staphylococcus hemolyticus-discussed with pharmacy and ID pharmacy.  Think this is more likely contaminant    LOS: 29 days A FACE TO FACE EVALUATION WAS PERFORMED  Fanny Dance 04/10/2023, 9:49 AM

## 2023-04-10 NOTE — Progress Notes (Signed)
[facility] Progress Note   Subjective:  Seen in 4W nurse's station, in wheelchair with safety belt. Oriented x 2.  Unable to complete full dialysis treatment yesterday. He was in restraints and still requiring multiple people to redirect and reorient. Seen RN note. HD discontinued for safety.    Objective Vitals:   04/09/23 1931 04/09/23 2128 04/10/23 0259 04/10/23 0826  BP: (!) 160/77  (!) 131/95   Pulse: 71  100   Resp: 15  17   Temp: 97.9 F (36.6 C)  98 F (36.7 C)   TempSrc:      SpO2: 95% 95% 96% 97%  Weight:      Height:       Physical Exam General: elderly man, nad  Heart: S1,S2 RRR Lungs: CTAB A/P Abdomen: NABS NT Extremities: No LE edema Dialysis Access: L AVF + T/B  Additional Objective Labs: Basic Metabolic Panel: Recent Labs  Lab 04/06/23 1330 04/08/23 1517 04/09/23 1219  NA 135 139 138  K 4.1 3.9 4.1  CL 93* 96* 99  CO2 31 26 26   GLUCOSE 106* 105* 100*  BUN 22 44* 51*  CREATININE 7.07* 10.93* 12.78*  CALCIUM 9.2 9.0 9.0  PHOS  --  4.5 4.7*   Liver Function Tests: Recent Labs  Lab 04/06/23 1330 04/08/23 1517 04/09/23 1219  AST 43*  --   --   ALT 34  --   --   ALKPHOS 86  --   --   BILITOT 1.0  --   --   PROT 7.0  --   --   ALBUMIN 2.6* 2.5* 2.5*   No results for input(s): "LIPASE", "AMYLASE" in the last 168 hours. CBC: Recent Labs  Lab 04/08/23 1517 04/09/23 1219  WBC 4.4 3.9*  NEUTROABS 2.9  --   HGB 9.4* 9.6*  HCT 31.5* 32.3*  MCV 91.8 91.8  PLT 169 185   Blood Culture    Component Value Date/Time   SDES URINE, CATHETERIZED 04/08/2023 0915   SPECREQUEST  04/08/2023 0915    NONE Performed at [facility], [address] [place], [place] [address]    CULT >=100,000 COLONIES/mL STAPHYLOCOCCUS HAEMOLYTICUS (A) 04/08/2023 0915   REPTSTATUS 04/10/2023 FINAL 04/08/2023 0915    Cardiac Enzymes: No results for input(s): "CKTOTAL", "CKMB", "CKMBINDEX", "TROPONINI" in the last 168 hours. CBG: No  results for input(s): "GLUCAP" in the last 168 hours. Iron Studies: No results for input(s): "IRON", "TIBC", "TRANSFERRIN", "FERRITIN" in the last 72 hours. @lablastinr3 @ Studies/Results: No results found. Medications:   (feeding supplement) PROSource Plus  30 mL Oral BID BM   ARIPiprazole  5 mg Oral QHS   aspirin EC  81 mg Oral Daily   atorvastatin  80 mg Oral Daily   darbepoetin (ARANESP) injection - DIALYSIS  150 mcg Subcutaneous Q Sun-1800   divalproex  125 mg Oral Q8H   feeding supplement  237 mL Oral BID WC   Gerhardt's butt cream  1 Application Topical QID   lidocaine  1 patch Transdermal Q2200   lidocaine  1 patch Transdermal Q24H   magnesium oxide  400 mg Oral Daily   mometasone-formoterol  2 puff Inhalation BID   pantoprazole  40 mg Oral BID   traZODone  50 mg Oral QHS     Dialysis Orders: Previously on CCPD thru [facility] - changed to HD - will need outpatient HD unit established prior to d/c. 4hr, 3K, EDW ~96, L AVF   Assessment/Plan: 1. Debility -  in CIR 2. Encephalopathy/agitation: Waxing/waning agitation, dementia sundowning. No acute findings on imaging. He continues to be disruptive during HD. Needs sitter. But sitters are not allowed in the hospital HD unit. Have d/w HD staff - they state that most if not all of his recent HD sessions have been problematic due to resistance and aggressive behavior. We have been using IV ativan to assist w/ getting him through dialysis. Use of IV Ativan can not be done in OP HD units. VIR  will not place catheter, agitation is not an indication to place cathter.  3. ESRD: Transitioned from PD to HD recently on TTS schedule. Using old AVF which is still functional. Had partial HD off schedule 9/23. Continue off schedule this week - plan for next HD 9/25 4.Hypertension/volume: BP higher, midodrine 5mg  TID stopped 9/13. UF as tolerated.  5. Anemia: Hgb 9.6 --on Aranesp weekly. Last Tsat 38% with ferritin 2175.  No iron.   6. Metabolic bone disease: CorrCa ok, Phos acceptable. No VDRA. 7.  Nutrition:  Alb very low, continue supps.  Renal diet w/fluid restrictions.  8.  Hx NSVT: On Coreg 10.  Recent culture negative PD cath-related peritonitis and associated fungemia + enterococcus bacteremia - underwent PD cath removal, TDC placement and removal, temp HD cath placement  and removal, and line holiday. Completed IV unasyn, IV micafungin and cipro on 8/15. TDC not replaced cause he had functional AVF.   11. GOC - Seen by palliative care - Now DNR/DNI wants to continue dialysis.  11. Dispo: For SNF placement. Regarding outpatient dialysis placement- per renal navigator he will need new referral since he has been gone from his prior clinic so long. At this time he is not appropriate for outpatient dialysis d/t ongoing agitation and requiring restraints and multiple staff to reorient during treatments.   Tomasa Blase PA-C [facility] 04/10/2023,9:19 AM

## 2023-04-10 NOTE — Progress Notes (Addendum)
Physical Therapy Session Note  Patient Details  Name: [name] [name] MRN: [id number] Date of Birth: Jun 26, 1951  Today's Date: 04/10/2023 PT Individual Time: 8657-8469 + 1131-1156 PT Individual Time Calculation (min): 38 min + 25 min  Short Term Goals: Week 4:  PT Short Term Goal 1 (Week 4): Pt will complete transfers min assist consistently. PT Short Term Goal 2 (Week 4): Pt will complete gait 50' with min assist consistently PT Short Term Goal 3 (Week 4): Pt will complete up/down 1 step with min assist  Skilled Therapeutic Interventions/Progress Updates:     SESSION 1: Pt presents in WC seated behind nurse's station, agreeable to PT. Pt denies pain but reporting itchiness on BUEs, requesting lotion. Session focused on therapeutic activities to redirect attention to task and increase comfort as well as gait training. Pt nephew present during session, nephew's presence does not affect pt's performance significantly however pt does seem more alert this session.  Pt transported to day room dependently via WC. Pt ambulates 2x10' WC<>mat with min assist R HHA. Pt then ambulates with R HHA 37', 2nd person chair follow requires cues for upright posture and to decrease reaching for objects on L side. Pt requires significant rest break between walking trials due to fatigue.  Pt reporting itching, therapist applies lotion to BUEs twice during session with pt demonstrating improved participation and decreased irritation after lotion application.  Pt returns to nurse's station and remains seated in [facility] with chair alarm donned and activated at end of session.   SESSION 2: Pt presents in WC seated behind nurse's station, agreeable to PT, denies pain. Session focused on therapeutic activities to facilitate improved participation with exercises and therapies as well as following verbal cues.  Pt transported to day room and positioned next to nustep. Pt completes stand step transfer to nustep with min  assist, mod verbal cues for sequencing. Pt tolerates 2.5 min actively on nustep with BUE/BLE workload 1 for a total of 7 minutes with pt taking frequent rest breaks requiring mod/max cues for attention to task.  Pt impulsively attempting to stand off of nustep prior to turning chair, 2nd person needed to assist pt back to chair due to poor sequencing and decreased ability to follow verbal cues.  Pt positioned at kinetron to attempt similar therex for BLE strengthening however therapist provides total assist for reciprocal movement with pt unable to sequence task for follow verbal cues.  Pt then prompted to complete WC mobility with BUE propulsion, pt requires max verbal cues and max hand over hand assist to propel due to pt with decreased ability to follow cues and sequence task despite verbalizing understanding of cues.  Pt returns to nurse's station and remains seated in [facility] with all needs within reach and chair alarm donned and activated at end of session.     Therapy Documentation Precautions:  Precautions Precautions: Fall Restrictions Weight Bearing Restrictions: No   Therapy/Group: Individual Therapy  Edwin Cap PT, DPT 04/10/2023, 10:33 AM

## 2023-04-10 NOTE — Progress Notes (Signed)
Speech Language Pathology Weekly Progress and Session Note  Patient Details  Name: [name] [name] MRN: [id number] Date of Birth: 09-03-1950  Beginning of progress report period: April 03, 2023 End of progress report period: April 10, 2023  Today's Date: 04/10/2023 SLP Individual Time: 4098-1191 SLP Individual Time Calculation (min): 30 min  Short Term Goals: Week 4: SLP Short Term Goal 1 (Week 4): Patient will demonstrate intellectual awareness of deficits via verbally stating 2 cognitive and 2 physical impairments given modA SLP Short Term Goal 1 - Progress (Week 4): Not met SLP Short Term Goal 2 (Week 4): Patient will demonstrate use of recommended safe swallow strategies during mealtimes with modA. SLP Short Term Goal 2 - Progress (Week 4): Met SLP Short Term Goal 3 (Week 4): Patient will utilize external aids to orient to time and situation given modA SLP Short Term Goal 3 - Progress (Week 4): Not met SLP Short Term Goal 4 (Week 4): Patient will participate in therapy sessions in 80% of opportunities given modA multimodal cues. SLP Short Term Goal 4 - Progress (Week 4): Not met    New Short Term Goals: Week 5: SLP Short Term Goal 1 (Week 5): Patient will utilize external aids to orient to time and situation given modA SLP Short Term Goal 2 (Week 5): Pt will complete functional problem solving with 50% acc with max A. SLP Short Term Goal 3 (Week 5): Pt will attend to functional task for 5-7 minutes given mod multimodal cues. SLP Short Term Goal 4 (Week 5): Pt will answer biographical questions via multimodal communication accurately with 75% acc with mod A  Weekly Progress Updates: Pt made limited gains and has met one of four STG's this reporting period due to improved tolerance of regular solid diet and use of swallowing precautions with mod A. Pt presented with decreased alertness and participation during session this reporting period which negatively impacted his success.  He also presented with impaired orientation to temporal, spatial, and situational concepts. Pt/ family education ongoing. Pt would benefit from continued skilled SLP intervention to maximize cognition, communication, and swallowing prior to discharge.   Intensity: Minumum of 1-2 x/day, 30 to 90 minutes Frequency: 3 to 5 out of 7 days Duration/Length of Stay: TBD d/t possible SNF placement Treatment/Interventions: Cognitive remediation/compensation;Environmental controls;Cueing hierarchy;Functional tasks;Therapeutic Activities;Internal/external aids;Patient/family education   Daily Session  Skilled Therapeutic Interventions: Pt was seen in am to address cognitive re- training. Pt was alert and seated upright in WC at nurse's station consuming breakfast meal. Pt agreeable to transfer to speech therapy office for continuation of meal. He administered remaining trials that were previously cut into small pieces by hand. Pt presented with prolonged mastication of bolus and min lingual residue post swallow. He was responsive to tactile placement of his drink in his hand and subsequently accepted liquid wash which facilitated oral clearance. Pt was without s/sx pen/asp. In additional minutes of session., SLP addressed orientation. Given external visual aids, presented verbally, pt required max A to identify month and year. Pt also unable to identify visually or verbalize his last name or birthday. Pt required max A for answering biographical information via open ended and closed ended questions. He frequently stated, "I don't know" in response to questions. Pt was transitioned back to nurse's station with chair alarm active at conclusion of session. sLP to continue POC.     General    Pain Pain Assessment Pain Scale: 0-10 Pain Score: 5  Pain Location:  (unable to verbalize  locale of pain)  Therapy/Group: Individual Therapy  Renaee Munda 04/10/2023, 4:19 PM

## 2023-04-10 NOTE — Plan of Care (Signed)
  Problem: RH Balance Goal: LTG Patient will maintain dynamic standing with ADLs (OT) Description: LTG:  Patient will maintain dynamic standing balance with assist during activities of daily living (OT)  Flowsheets (Taken 04/10/2023 1036) LTG: Pt will maintain dynamic standing balance during ADLs with: Minimal Assistance - Patient > 75% Note: Goal downgraded 9/24-ESD   Problem: Sit to Stand Goal: LTG:  Patient will perform sit to stand in prep for activites of daily living with assistance level (OT) Description: LTG:  Patient will perform sit to stand in prep for activites of daily living with assistance level (OT) Flowsheets (Taken 04/10/2023 1036) LTG: PT will perform sit to stand in prep for activites of daily living with assistance level: Minimal Assistance - Patient > 75% Note: Goal downgraded 9/24-ESD   Problem: RH Bathing Goal: LTG Patient will bathe all body parts with assist levels (OT) Description: LTG: Patient will bathe all body parts with assist levels (OT) Flowsheets (Taken 04/10/2023 1036) LTG: Pt will perform bathing with assistance level/cueing: Moderate Assistance - Patient 50 - 74% Note: Goal downgraded 9/24-ESD   Problem: RH Dressing Goal: LTG Patient will perform upper body dressing (OT) Description: LTG Patient will perform upper body dressing with assist, with/without cues (OT). Flowsheets (Taken 04/10/2023 1036) LTG: Pt will perform upper body dressing with assistance level of: Minimal Assistance - Patient > 75% Note: Goal downgraded 9/24-ESD Goal: LTG Patient will perform lower body dressing w/assist (OT) Description: LTG: Patient will perform lower body dressing with assist, with/without cues in positioning using equipment (OT) Outcome: Not Applicable Flowsheets (Taken 04/10/2023 1036) LTG: Pt will perform lower body dressing with assistance level of: (dc goal 9/24-ESD) -- Note: D/c goal 9/24-ESD   Problem: RH Toileting Goal: LTG Patient will perform toileting  task (3/3 steps) with assistance level (OT) Description: LTG: Patient will perform toileting task (3/3 steps) with assistance level (OT)  Outcome: Not Applicable Flowsheets (Taken 04/10/2023 1036) LTG: Pt will perform toileting task (3/3 steps) with assistance level: (d/c goal 9/24-ESD) -- Note: D/c goal 9/24-ESD   Problem: RH Toilet Transfers Goal: LTG Patient will perform toilet transfers w/assist (OT) Description: LTG: Patient will perform toilet transfers with assist, with/without cues using equipment (OT) Flowsheets (Taken 04/10/2023 1036) LTG: Pt will perform toilet transfers with assistance level of: Minimal Assistance - Patient > 75% Note: Goal downgraded 9/24-ESD   Problem: RH Tub/Shower Transfers Goal: LTG Patient will perform tub/shower transfers w/assist (OT) Description: LTG: Patient will perform tub/shower transfers with assist, with/without cues using equipment (OT) Outcome: Not Applicable Flowsheets (Taken 04/10/2023 1036) LTG: Pt will perform tub/shower stall transfers with assistance level of: (d/c goal 9/24-ESD) -- Note: D/c goal 9/24-ESD

## 2023-04-10 NOTE — Progress Notes (Signed)
Occupational Therapy Session Note  Patient Details  Name: YOHANNES THOMASTON MRN: [id number] Date of Birth: Dec 01, 1950  Today's Date: 04/10/2023 OT Individual Time: 7829-5621 OT Individual Time Calculation (min): 27 min    Short Term Goals: Week 4:  OT Short Term Goal 1 (Week 4): To continue to consistently meet LTG.  Skilled Therapeutic Interventions/Progress Updates:    Pt greeted sitting the nurses station. Pt much more pleasant today. Confused but overall cooperative. Pt brought to room for bathing/dressing tasks as he was sill in his gown. Pt brought to the sink, but falling asleep intermittently requiring cues to maintain alertness. Pt only agreeable for OT to wash his back, but declined to participate in performing any of his own bathing tasks. Pt did initiate dressing tasks better using backwards chaining. Sit<>stands at the sink with min A and cues to initiate stand. Pt did follow commands and did not try to walk off after standing which is what he has been doing. Pt returned to the nurses station and left seated with alarm belt on and needs met.   Therapy Documentation Precautions:  Precautions Precautions: Fall Restrictions Weight Bearing Restrictions: No Pain:  Denies pain   Therapy/Group: Individual Therapy  [name] 04/10/2023, 8:59 AM

## 2023-04-11 ENCOUNTER — Encounter (HOSPITAL_COMMUNITY): Payer: Medicare Other

## 2023-04-11 DIAGNOSIS — Z7189 Other specified counseling: Secondary | ICD-10-CM | POA: Diagnosis not present

## 2023-04-11 DIAGNOSIS — Z515 Encounter for palliative care: Secondary | ICD-10-CM | POA: Diagnosis not present

## 2023-04-11 LAB — CBC
HCT: 31.9 % — ABNORMAL LOW (ref 39.0–52.0)
Hemoglobin: 9.5 g/dL — ABNORMAL LOW (ref 13.0–17.0)
MCH: 28.1 pg (ref 26.0–34.0)
MCHC: 29.8 g/dL — ABNORMAL LOW (ref 30.0–36.0)
MCV: 94.4 fL (ref 80.0–100.0)
Platelets: 186 10*3/uL (ref 150–400)
RBC: 3.38 MIL/uL — ABNORMAL LOW (ref 4.22–5.81)
RDW: 15.4 % (ref 11.5–15.5)
WBC: 4.5 10*3/uL (ref 4.0–10.5)
nRBC: 0 % (ref 0.0–0.2)

## 2023-04-11 LAB — RENAL FUNCTION PANEL
Albumin: 2.4 g/dL — ABNORMAL LOW (ref 3.5–5.0)
Anion gap: 14 (ref 5–15)
BUN: 45 mg/dL — ABNORMAL HIGH (ref 8–23)
CO2: 25 mmol/L (ref 22–32)
Calcium: 9.1 mg/dL (ref 8.9–10.3)
Chloride: 97 mmol/L — ABNORMAL LOW (ref 98–111)
Creatinine, Ser: 12.26 mg/dL — ABNORMAL HIGH (ref 0.61–1.24)
GFR, Estimated: 4 mL/min — ABNORMAL LOW (ref >60)
Glucose, Bld: 152 mg/dL — ABNORMAL HIGH (ref 70–99)
Phosphorus: 4.7 mg/dL — ABNORMAL HIGH (ref 2.5–4.6)
Potassium: 4.2 mmol/L (ref 3.5–5.1)
Sodium: 136 mmol/L (ref 135–145)

## 2023-04-11 MED ORDER — PENTAFLUOROPROP-TETRAFLUOROETH EX AERO: 1.0000 | INHALATION_SPRAY | CUTANEOUS | Status: DC | PRN

## 2023-04-11 MED ORDER — LIDOCAINE HCL (PF) 1 % IJ SOLN: 5.0000 mL | INTRAMUSCULAR | Status: DC | PRN

## 2023-04-11 MED ORDER — HEPARIN SODIUM (PORCINE) 1000 UNIT/ML DIALYSIS
20.0000 [IU]/kg | INTRAMUSCULAR | Status: DC | PRN
  Administered 2023-04-11: 1800 [IU] via INTRAVENOUS_CENTRAL
  Filled 2023-04-11: qty 2

## 2023-04-11 MED ORDER — HEPARIN SODIUM (PORCINE) 1000 UNIT/ML DIALYSIS: 1000.0000 [IU] | INTRAMUSCULAR | Status: DC | PRN

## 2023-04-11 MED ORDER — LIDOCAINE-PRILOCAINE 2.5-2.5 % EX CREA: 1.0000 | TOPICAL_CREAM | CUTANEOUS | Status: DC | PRN

## 2023-04-11 MED ORDER — ALTEPLASE 2 MG IJ SOLR: 2.0000 mg | Freq: Once | INTRAMUSCULAR | Status: DC | PRN

## 2023-04-11 MED ORDER — ANTICOAGULANT SODIUM CITRATE 4% (200MG/5ML) IV SOLN: 5.0000 mL | Status: DC | PRN

## 2023-04-11 NOTE — Progress Notes (Signed)
Received patient in bed to unit.  Alert and oriented.  Informed consent signed and in chart.   TX duration:3.5 hours  Patient tolerated well.  Transported back to the room  Alert, without acute distress.  Hand-off given to patient's nurse.   Access used: Left Upper arm fistula Access issues: none  Total UF removed: Medication(s) given: Scheduled Ativan   04/11/23 1621  Vitals  Temp 98.8 F (37.1 C)  Temp Source Axillary  BP (!) 146/82  BP Location Right Arm  BP Method Automatic  Patient Position (if appropriate) Lying  Pulse Rate 74  Resp 19  MEWS COLOR  MEWS Score Color Green  Oxygen Therapy  SpO2 98 %  O2 Device Room Air  MEWS Score  MEWS Temp 0  MEWS Systolic 0  MEWS Pulse 0  MEWS RR 0  MEWS LOC 0  MEWS Score 0     Stacie Glaze LPN [facility]

## 2023-04-11 NOTE — Progress Notes (Signed)
Occupational Therapy Session Note  Patient Details  Name: [name] [name] MRN: [id number] Date of Birth: [birth year]  Today's Date: 04/11/2023 OT Missed Time: 45 Minutes Missed Time Reason: Patient fatigue  Pt greeted asleep in supine, unable to arouse enough to participate in session, will continue efforts as time allows to make up for missed minutes.    Therapy/Group: Individual Therapy  Pollyann Glen [facility] 04/11/2023, 12:28 PM

## 2023-04-11 NOTE — Procedures (Addendum)
Patient was seen on dialysis and the procedure was supervised.  BFR 400  Via AVF BP is  164/97.  He is agitated and trying to pull needles. He is on wrist restraint for his safely but still agitated. Noted he got ativan IV as well.  Clearly, can't do OP HD at this time.  Will follow and update PCT and primary team.   Maxie Barb 04/11/2023

## 2023-04-11 NOTE — Plan of Care (Signed)
  Problem: Consults Goal: RH GENERAL PATIENT EDUCATION Description: See Patient Education module for education specifics. Outcome: Progressing   Problem: RH BOWEL ELIMINATION Goal: RH STG MANAGE BOWEL WITH ASSISTANCE Description: STG Manage Bowel with mod I Assistance. Outcome: Progressing Goal: RH STG MANAGE BOWEL W/MEDICATION W/ASSISTANCE Description: STG Manage Bowel with Medication with mod I Assistance. Outcome: Progressing   Problem: RH SAFETY Goal: RH STG ADHERE TO SAFETY PRECAUTIONS W/ASSISTANCE/DEVICE Description: STG Adhere to Safety Precautions With cues Assistance/Device. Outcome: Progressing   Problem: RH PAIN MANAGEMENT Goal: RH STG PAIN MANAGED AT OR BELOW PT'S PAIN GOAL Description: < 4 with prns Outcome: Progressing   Problem: RH KNOWLEDGE DEFICIT GENERAL Goal: RH STG INCREASE KNOWLEDGE OF SELF CARE AFTER HOSPITALIZATION Description: Patient and wife will be able to manage care at discharge with educational resources independently Outcome: Progressing   Problem: Safety: Goal: Non-violent Restraint(s) Outcome: Progressing   Problem: Education: Goal: Ability to describe self-care measures that may prevent or decrease complications (Diabetes Survival Skills Education) will improve Outcome: Progressing Goal: Individualized Educational Video(s) Outcome: Progressing   Problem: Coping: Goal: Ability to adjust to condition or change in health will improve Outcome: Progressing   Problem: Fluid Volume: Goal: Ability to maintain a balanced intake and output will improve Outcome: Progressing   Problem: Health Behavior/Discharge Planning: Goal: Ability to identify and utilize available resources and services will improve Outcome: Progressing Goal: Ability to manage health-related needs will improve Outcome: Progressing   Problem: Metabolic: Goal: Ability to maintain appropriate glucose levels will improve Outcome: Progressing   Problem: Nutritional: Goal:  Maintenance of adequate nutrition will improve Outcome: Progressing Goal: Progress toward achieving an optimal weight will improve Outcome: Progressing   Problem: Skin Integrity: Goal: Risk for impaired skin integrity will decrease Outcome: Progressing   Problem: Tissue Perfusion: Goal: Adequacy of tissue perfusion will improve Outcome: Progressing   Problem: Education: Goal: Knowledge of General Education information will improve Description: Including pain rating scale, medication(s)/side effects and non-pharmacologic comfort measures Outcome: Progressing   Problem: Health Behavior/Discharge Planning: Goal: Ability to manage health-related needs will improve Outcome: Progressing   Problem: RH BOWEL ELIMINATION Goal: RH STG MANAGE BOWEL WITH ASSISTANCE Description: STG Manage Bowel with mod I Assistance. Outcome: Progressing   Problem: RH BOWEL ELIMINATION Goal: RH STG MANAGE BOWEL W/MEDICATION W/ASSISTANCE Description: STG Manage Bowel with Medication with mod I Assistance. Outcome: Progressing   Problem: RH SAFETY Goal: RH STG ADHERE TO SAFETY PRECAUTIONS W/ASSISTANCE/DEVICE Description: STG Adhere to Safety Precautions With cues Assistance/Device. Outcome: Progressing   Problem: RH PAIN MANAGEMENT Goal: RH STG PAIN MANAGED AT OR BELOW PT'S PAIN GOAL Description: < 4 with prns Outcome: Progressing   Problem: RH KNOWLEDGE DEFICIT GENERAL Goal: RH STG INCREASE KNOWLEDGE OF SELF CARE AFTER HOSPITALIZATION Description: Patient and wife will be able to manage care at discharge with educational resources independently Outcome: Progressing   Problem: Tissue Perfusion: Goal: Adequacy of tissue perfusion will improve Outcome: Progressing   Problem: Education: Goal: Knowledge of General Education information will improve Description: Including pain rating scale, medication(s)/side effects and non-pharmacologic comfort measures Outcome: Progressing   Problem: Health  Behavior/Discharge Planning: Goal: Ability to manage health-related needs will improve Outcome: Progressing

## 2023-04-11 NOTE — Progress Notes (Signed)
[facility] Progress Note   Subjective:  Seen in room. No new events overnight. No complaints. Discussed going to dialysis today.    Objective Vitals:   04/10/23 1928 04/10/23 1930 04/11/23 0500 04/11/23 0547  BP: (!) 143/84   (!) 164/97  Pulse: (!) 52   82  Resp: 15   15  Temp: 98.4 F (36.9 C)   97.7 F (36.5 C)  TempSrc:      SpO2: 93% 95%  92%  Weight:   88.3 kg   Height:       Physical Exam General: elderly man, nad  Heart: S1,S2 RRR Lungs: CTAB A/P Abdomen: NABS NT Extremities: No LE edema Dialysis Access: L AVF + T/B  Additional Objective Labs: Basic Metabolic Panel: Recent Labs  Lab 04/06/23 1330 04/08/23 1517 04/09/23 1219  NA 135 139 138  K 4.1 3.9 4.1  CL 93* 96* 99  CO2 31 26 26   GLUCOSE 106* 105* 100*  BUN 22 44* 51*  CREATININE 7.07* 10.93* 12.78*  CALCIUM 9.2 9.0 9.0  PHOS  --  4.5 4.7*   Liver Function Tests: Recent Labs  Lab 04/06/23 1330 04/08/23 1517 04/09/23 1219  AST 43*  --   --   ALT 34  --   --   ALKPHOS 86  --   --   BILITOT 1.0  --   --   PROT 7.0  --   --   ALBUMIN 2.6* 2.5* 2.5*   No results for input(s): "LIPASE", "AMYLASE" in the last 168 hours. CBC: Recent Labs  Lab 04/08/23 1517 04/09/23 1219  WBC 4.4 3.9*  NEUTROABS 2.9  --   HGB 9.4* 9.6*  HCT 31.5* 32.3*  MCV 91.8 91.8  PLT 169 185   Blood Culture    Component Value Date/Time   SDES URINE, CATHETERIZED 04/08/2023 0915   SPECREQUEST  04/08/2023 0915    NONE Performed at [facility], [address] [place], [place] [address]    CULT >=100,000 COLONIES/mL STAPHYLOCOCCUS HAEMOLYTICUS (A) 04/08/2023 0915   REPTSTATUS 04/10/2023 FINAL 04/08/2023 0915    Cardiac Enzymes: No results for input(s): "CKTOTAL", "CKMB", "CKMBINDEX", "TROPONINI" in the last 168 hours. CBG: No results for input(s): "GLUCAP" in the last 168 hours. Iron Studies: No results for input(s): "IRON", "TIBC", "TRANSFERRIN", "FERRITIN" in the last 72  hours. @lablastinr3 @ Studies/Results: No results found. Medications:  anticoagulant sodium citrate      (feeding supplement) PROSource Plus  30 mL Oral BID BM   ARIPiprazole  5 mg Oral QHS   aspirin EC  81 mg Oral Daily   atorvastatin  80 mg Oral Daily   Chlorhexidine Gluconate Cloth  6 each Topical Q0600   darbepoetin (ARANESP) injection - DIALYSIS  150 mcg Subcutaneous Q Sun-1800   divalproex  125 mg Oral Q8H   feeding supplement  237 mL Oral BID WC   Gerhardt's butt cream  1 Application Topical QID   lidocaine  1 patch Transdermal Q2200   lidocaine  1 patch Transdermal Q24H   magnesium oxide  400 mg Oral Daily   memantine  5 mg Oral Daily   mometasone-formoterol  2 puff Inhalation BID   pantoprazole  40 mg Oral BID   traZODone  50 mg Oral QHS     Dialysis Orders: Previously on [facility]CPD thru [facility] - changed to HD - will need outpatient HD unit established prior to d/c. 4hr, 3K, EDW ~96, L AVF   Assessment/Plan: 1. Debility -  in CIR 2. Encephalopathy/agitation: Waxing/waning agitation, dementia sundowning. No acute findings on imaging. He continues to be disruptive during HD. Needs sitter. But sitters are not allowed in the hospital HD unit. Have d/w HD staff - they state that most if not all of his recent HD sessions have been problematic due to resistance and aggressive behavior. We have been using IV ativan to assist w/ getting him through dialysis. Use of IV Ativan can not be done in OP HD units. VIR  will not place catheter, agitation is not an indication to place cathter.  3. ESRD: Transitioned from PD to HD recently on TTS schedule. Using old AVF which is still functional. Had partial HD off schedule 9/23. Continue off schedule this week - plan for next HD 9/25 4.Hypertension/volume: BP higher, midodrine 5mg  TID stopped 9/13. UF as tolerated.  5. Anemia: Hgb 9.6 --on Aranesp weekly. Last Tsat 38% with ferritin 2175.  No iron.  6. Metabolic bone disease:  CorrCa ok, Phos acceptable. No VDRA. 7.  Nutrition:  Alb very low, continue supps.  Renal diet w/fluid restrictions.  8.  Hx NSVT: On Coreg 10.  Recent culture negative PD cath-related peritonitis and associated fungemia + enterococcus bacteremia - underwent PD cath removal, TDC placement and removal, temp HD cath placement  and removal, and line holiday. Completed IV unasyn, IV micafungin and cipro on 8/15. TDC not replaced cause he had functional AVF.   11. GOC - Seen by palliative care - Now DNR/DNI wants to continue dialysis.  11. Dispo: For SNF placement. Regarding outpatient dialysis placement- per renal navigator he will need new referral since he has been gone from his prior clinic so long. At this time he is not appropriate for outpatient dialysis d/t ongoing agitation and requiring restraints and multiple staff to reorient during treatments.   Tomasa Blase PA-C [facility] 04/11/2023,10:09 AM

## 2023-04-11 NOTE — Progress Notes (Signed)
PROGRESS NOTE   Subjective/Complaints:  Episode of hematuria noted last night.  No pt concerns this AM.   [id number] due to cognition/behavioral   Objective:   No results found. Recent Labs    04/08/23 1517 04/09/23 1219  WBC 4.4 3.9*  HGB 9.4* 9.6*  HCT 31.5* 32.3*  PLT 169 185      Recent Labs    04/08/23 1517 04/09/23 1219  NA 139 138  K 3.9 4.1  CL 96* 99  CO2 26 26  GLUCOSE 105* 100*  BUN 44* 51*  CREATININE 10.93* 12.78*  CALCIUM 9.0 9.0       Intake/Output Summary (Last 24 hours) at 04/11/2023 1042 Last data filed at 04/11/2023 0829 Gross per 24 hour  Intake 840 ml  Output --  Net 840 ml          Physical Exam: Vital Signs Blood pressure (!) 164/97, pulse 82, temperature 97.7 F (36.5 C), resp. rate 15, height 5\' 4"  (1.626 m), weight 88.3 kg, SpO2 92%.     General: NAD, eating breakfast in his room HENT: conjugate gaze; oropharynx moist CV: regular rate; no JVD Pulmonary: CTA B/L; no W/R/R- good air movement GI: soft, NT, ND, (+)BS Psychiatric: inappropriate, confused, pleasant Neurological: Calm sitting at nurses station  Ext: no clubbing, cyanosis, or edema Psych: Confused, not agitated at this time Neurologic Exam:   Awake and alert Oriented to self, rehabilitation center + Memory deficit - ongoing  Sensory exam: Intact to touch in all 4 extremities Motor exam: moves all 4 extremities without limitiations Coordination: Slight bilateral upper extremity tremor, intention, mild.    Musculoskeletal: No joint swelling noted No abnormal tone      Assessment/Plan: 1. Functional deficits which require 15 hr per week of interdisciplinary therapy in a comprehensive inpatient rehab setting. Physiatrist is providing close team supervision and 24 hour management of active medical problems listed below. Physiatrist and rehab team continue to assess barriers to discharge/monitor  patient progress toward functional and medical goals  Care Tool:  Bathing    Body parts bathed by patient: Right arm, Left arm, Chest, Abdomen, Front perineal area, Right upper leg, Buttocks, Left upper leg, Right lower leg, Left lower leg, Face         Bathing assist Assist Level: Moderate Assistance - Patient 50 - 74%     Upper Body Dressing/Undressing Upper body dressing   What is the patient wearing?: Pull over shirt    Upper body assist Assist Level: Minimal Assistance - Patient > 75%    Lower Body Dressing/Undressing      Lower body dressing      What is the patient wearing?: Underwear/pull up, Pants     Lower body assist Assist for lower body dressing: Maximal Assistance - Patient 25 - 49%     Toileting Toileting    Toileting assist Assist for toileting: Maximal Assistance - Patient 25 - 49%     Transfers Chair/bed transfer  Transfers assist     Chair/bed transfer assist level: Maximal Assistance - Patient 25 - 49%     Locomotion Ambulation   Ambulation assist      Assist level: Minimal Assistance -  Patient > 75% Assistive device: No Device Max distance: 175'   Walk 10 feet activity   Assist     Assist level: Contact Guard/Touching assist Assistive device: No Device   Walk 50 feet activity   Assist Walk 50 feet with 2 turns activity did not occur: Safety/medical concerns  Assist level: Minimal Assistance - Patient > 75% Assistive device: No Device    Walk 150 feet activity   Assist Walk 150 feet activity did not occur: Safety/medical concerns  Assist level: Minimal Assistance - Patient > 75% Assistive device: No Device    Walk 10 feet on uneven surface  activity   Assist Walk 10 feet on uneven surfaces activity did not occur: Safety/medical concerns         Wheelchair     Assist Is the patient using a wheelchair?: Yes Type of Wheelchair: Manual    Wheelchair assist level: Dependent - Patient 0%       Wheelchair 50 feet with 2 turns activity    Assist        Assist Level: Dependent - Patient 0%   Wheelchair 150 feet activity     Assist      Assist Level: Dependent - Patient 0%   Blood pressure (!) 164/97, pulse 82, temperature 97.7 F (36.5 C), resp. rate 15, height 5\' 4"  (1.626 m), weight 88.3 kg, SpO2 92%.  Medical Problem List and Plan: 1. Functional deficits secondary to metabolic encephalopathy due to sepsis secondary to catheter peritonitis and hypoxemia              -patient may not shower             -ELOS/Goals: 16-18 days, PT/OT Sup, SLP min A -tentative discharge date 9-9           -Continue CIR therapies including PT, OT, and SLP   -Will review records from [facility] -- has worsened since approximately 8/22, possible 8/19, consistent with records from nursing at [facility] with current behavior.   -Therapy QD schedule  -Therapy suggest having patient set up for short time before his therapy sessions to help him wake up.  I think this would be a good idea  Con't CIR PT, OT and SLP  Patient was seen by palliative care, changed to DNR status  -SNF vs Home discharge  2.  Antithrombotics: -DVT/anticoagulation:  Pharmaceutical: Heparin, 03/23/23 changed to Lovenox yesterday to help improved compliance             -antiplatelet therapy: aspirin and Plavix   3. Pain Management: Tylenol as needed             -Lidoderm patches to back  -Elavil 25 mg at bedtime for nerve pain and sleep 8/27  -8/29: Pain well controlled on Elavil; DC lyrica, PRN Tylenol/oxy 2.5-5 mg d/t CKD  8-30: Appears patient has been getting pain medications together overnight along with antipsychotics, will discuss with nursing  8-31: Patient having an difficulty endorsing needs and not utilizing PRNs, scheduled oxycodone 5 mg 3 times daily and has 2.5 mg prn 3 times daily available.   9-1: Not utilizing PRNs, minimal complaints of pain, will discontinue scheduled given lethargy  today..  -muscle rub R shoulder  9/16 patient reports pain is under control overall   9/25 denies pain 4. Mood/Behavior/Sleep: LCSW to evaluate and provide emotional support             -continue Seroquel 50 mg TID             -  clonazepam 0.5 mg TID prn             -Geodon 10 mg IM q 12 hours prn             -antipsychotic agents: n/a  -8-27: Agitated by soft restraints in bed, transition to 4 side rails, Posey alarm, and TeleSitter for now.  Veil bed on standby if needed, however feel we should avoid this if possible due to likelihood of agitating patient further   - 8/28: Placed in veil bed; wrist restraints for dialysis. Scheduled Geodon 20 mg twice daily; discontinue Seroquel and clonazepam due to no perceivable benefit. Add as needed Zyprexa 5 mg twice daily p.o. or IM due to recent cardiac arrhythmias and concern for QTc prolongation. As needed ramelteon for sleep. Stop gabapentin due to possible side effect of agitation  - 8/28: No improvement except temporarily with Geodon; add depakote 125 mg q8h for ongoing aggitation, mood stabilization. Increase elavil to 50 mg at bedtime.  Workup ongoing.    - 8/29: EEG negative, labs normal. MRI pending. Sleeping well and calm since starting depakote and increase in elavil. Will monitor tonight and wean Geodon in AM if appropriate.   8-30: MRI showing chronic microvascular changes, otherwise normal.  Increasingly calm and appropriate during the day, with ongoing agitation at nighttime.  Will increase nighttime Geodon to 40 mg; will discuss sleep log with nursing, as it has not been performed.  8-31 no sleep overnight per sleep log, again getting agitated around 9 PM and 5 AM.  Not responsive to as needed Zyprexa, responsive to Ativan.  Attempted to wean off scheduled a.m. Geodon today, but patient became extremely agitated and unable to redirect, calm down with readministration of medication.  Resume Geodon 20 mg every morning/40 mg every afternoon,  increased Elavil to 75 mg nightly, adding trazodone 50 mg nightly as needed.  P.o./IV Ativan 0.5 mg every 6 hours for agitation.  9-1: slept well overnight with trazodone and increased Elavil, no PRNs used.  Unfortunately, oversedated today.  Discontinue scheduled pain medications as above, start wean of Elavil to 50 mg x2 days -> 25 mg x2 days -> 10 mg x2 days, off due to no apparent benefit and no complaints of pain.  Reduce Geodon from 20 mg every morning/40 mg every evening to 20 mg twice daily.  Continue trazodone as needed for sleep, as this seems to be the difference for a restful night yesterday.  If he remains overly sedated, would dose reduce and DC Depakote next.  Keep Ativan as needed d/t dialysis not having other options available.  9/2-3--non agitated but still sleepy   -dc'ed depakote as I doubt it's doing much at this dose anyway   -continue geodon at 20mg  bid   -scheduled trazodone at HS with back up dose if needed    -sleep was intermittent last night   -requires enclosure bed for safety  9/4 continue enclosure bed for now, consider trying DC of this device early next week  -9/5 appears to be doing better overall this morning, continues to have intermittent agitation at times.  Continue enclosure bed  -9/6 agitation appears to be improving, patient is skeptical about medications and has been declining some of these.  Will discontinue multivitamin, RisaQuad to try to decrease pill burden  -9/7 continues to have intermittent episodes of agitation however overall improving, appears to be taking most of his medications  9/9 agitation and mental status appears to be improving -discussing discontinuation of  enclosure bed with team 9/10 Geodon dose increased to 40 mg at night, continue 20 mg in the morning 9/11 No acute events noted, will check with team regarding DC enclosure bed, nursing reports he did ok last night but had dialysis 9/12 continues to have sundowning/waxing waning  confusion and agitation, continue enclosure bed for now, will caution with Geodon will continue current dose but hold off on any escalation as this can contribute to bradycardia 9/16 no longer using enclosure bed, continue Geodon 20 mg twice daily for now due to intermittent agitation 9/17 patient with agitation and confusion at night, has sedation during the day.  Discussed with psychiatry Dr. Gasper Sells.  Will discontinue Geodon and try Abilify tonight.  Dr. Gasper Sells also suggested considering donepezil to shorten hallucinations of delirium.  Psychiatry consult placed, will see tomorrow.  Appreciate assistance 9/18 no behavioral related notes overnight, psychiatry plans to see him today.  Appreciate assistance 9/19 patient was seen by psychiatry yesterday, recommended continue Abilify, consider donepezil or memantine, suggested avoiding stimulants.  Will continue current regimen for now as it appears he is doing better.  He appears to be less sedated and did not see frequent notes of agitation but this regimen thus far. 9/20 recheck CMP/ammonia/VPA level today due to continued drowsiness  9/21- Depakote level 16- too low- Ammonia 19- low- asleep this AM, but haven't been called today about sedation/lethargy.  9/22- more hallucinations per nursing- labs attempted- couldn't get- couldn't get U/A results since so much hematuria. Trying to get labs, to see if getting ill vs missing HD? 9/23 will go back to 3 times daily for Depakote to help avoid agitation 9/24 continue current regimen with Abilify and start memantine 5 mg daily 9/25 monitor response to memantine addition 5. Neuropsych/cognition: This patient is not capable of making decisions on his own behalf   -recommend neuropsych consult   - 8/27: Ongoing aggitation; Consult hospitalist for further workup on potential contributors to encephalopathy. Pending UA, LA 2.1-> 1.4; otherwise negative so far -Delirium precautions, sleep log, Veiled bed  for safety and to minimize restraints -Wife has expressed that she is not interested in palliative care at this time; hopefully with above measures patient can get dialysis in the next 48 hours to limit worsening uremic encephalopathy and medical decline    - 8/29: Aggitation much improved, able to get some dialysis last night and today. See above for medication changes. Calling wife tonight to discuss.   8-30: Still with moderate cognitive deficits, however appears once patient is less agitated he is better able to concentrate and has better orientation.  MRI negative for acute findings, monitor.  9/3 agitation better. More sedated than anything at this point. Confused as well.    -depakote stopped   -continue other meds as rx'ed  9/21- pt refused Depakote this AM "since doesn't have seizures".   9/22- per nursing, attempted 3 different times yesterday and today to give Depakote 6. Skin/Wound Care: Routine skin care checks             -sacral skin off-loading/protection   7. Fluids/Electrolytes/Nutrition: Strict Is and Os and follow-up chemistries per nephrology             -renal diet/ 1200 cc FR  - Renvela 800 mg 3 times daily for hyperphosphatemia added per nephrology 8-27   9/21- Last Cr 7.07 and BUN 22 8: Hypotension: monitor TID and prn             -continue midodrine as  needed during dialysis  Normotensive, monitor  9-1: Consistently 90s over 60s, has been stable but given findings of microvascular changes on MRI, may be too low for patient to perfuse properly.  Will add midodrine 5 mg tid and monitor.  DC Coreg as heart rate has been low to normal; may resume at low-dose once blood pressure improved.   9/2 continue with ccurrent regimen. Bp's hr remain low   -elavil wean should help  9/9-10 BP stable, continue midodrine and monitor  9/13 DC midodrine today as BP has been higher, can restart if needed  Improved, cont to monitor  9/25 Hypotension improved, continue to monitor     04/11/2023    5:47 AM 04/11/2023    5:00 AM 04/10/2023    7:28 PM  Vitals with BMI  Weight  194 lbs 11 oz   BMI  33.4   Systolic 164  143  Diastolic 97  84  Pulse 82  52      9: Hyperlipidemia: continue statin   10: CAD s/p CABG 2023: on DAPT and statin.  - troponin elevated but stable, monitor 8/27   11: DM-2: A1c = 6.3% (at home on Lantus 22 units at bedtime, Tradjenta 5 mg daily)        -monitor PO intake             -continue SSI (0-6 units) does not appear to be requiring coverage   - 9/6 DC SSI, change CBGs to daily  9/8 discontinue CBGs  9/16 glucose still controlled on BMP yesterday   9/23 glucose 100 on BMP yesterday  12: ESRD: HD on Tues, Thurs, Sat -nephrology made aware   - Unable to tolerate 8/27 d/t aggitation, stopped early 8/28;  - attempting again 8/29, stopped early again -Per nephrology, redirectable with gum -Have to switch restraints during dialysis sessions from veil bed to regular bed, then resume veil bed order once at rehab -9/9 tolerating HD better 9/18 patient's wife indicates she would be able to stay with him during dialysis after discharge   9/22- pt refused/too agitated ot get HD yesterday  9/24 nephrology does not feel like he is a candidate for outpatient dialysis at this time due to agitation  9/25 HD today, nephrology note reviewed 13: COPD/OSA; chronic home 2L O2 (Symbicort, Ventolin, prednisone 40 mg daily at home)             -continue symbicort  -Added home oxygen back 8-27   14: GERD: continue Protonix   15: Non-sustained VT/pAF/bradycardia: per cardiology, Dr. Algie Coffer: -continue carvedilol 6.25 mg BID; consider increasing to 12.5 mg BID before starting low dose amiodarone  -consider Eliquis and discontinue asa and Plavix in one week if no active GI bleeding and stable hemoglobin from 8/24 -hemoglobin has remained stable but very low around 7, no recurrent A-fib, monitor for now given agitation/fall 8-31 and consider initiating Eliquis  when safe -Continue mag-ox to avoid hypomagnesemia -8-27: Single instance of bradycardia this morning 40s, may have been missed read, monitor through today and do not further adjust beta-blocker medications  -9-3 hr controlled after holding coreg -9/4-5 HR stable, continue to monitor -9/9 consider Eliquis this week if agitation continues to improve -9/10 will hold off on Eliquis due to sundowning at night, will see how he does with increase Geodon -9/11 EKG for bradycardia episode today, 2nd degree A-V block mobitz type 1.  Continue to hold beta blocker,9/11 will restart eliquis at 2.5 mg BID dose. Will consult cardiology  regarding need for ASA/plavix and coreg.  Dr. Elease Hashimoto does not appear convinced that he has A-fib 9/12 appreciate cardiology consult yesterday, DAPT transition to daily aspirin 81 only and Eliquis was discontinued, caution with Geodon as this can contribute to bradycardia 9/13 will decrease Geodon nighttime dose to 20 due to episodes of bradycardia 9/17 will discontinue Geodon due to variable cardiac effects 9/24 bradycardia continues to be improved after Geodon discontinued     04/11/2023    5:47 AM 04/11/2023    5:00 AM 04/10/2023    7:28 PM  Vitals with BMI  Weight  194 lbs 11 oz   BMI  33.4   Systolic 164  143  Diastolic 97  84  Pulse 82  52       16: HFrEF: Daily weights. Monitor for signs of overload  - 9/7 continue daily weights, fluid level management per hemodialysis team Weight reviewed and stable  9/21- 94.3- down 3 kg-   9/22- no weight for today- too agitated per nursing [facility] Weights   04/09/23 1545 04/09/23 1900 04/11/23 0500  Weight: 92.3 kg 91.1 kg 88.3 kg      17: Liver nodularity on CT scan: LFTs reported to be normal. Avoid hepatotoxic medications    -Abdominal ultrasound earlier this admission with minimal ascites; ammonia within normal limits  -8/31: Did discuss with nephrology whether p.o. lactulose would be beneficial despite normal  ammonia, after reviewing records unsure that this would be beneficial    18: Epistaxis - resolved             -previously improved with flonase and saline spray   19: Morbid Obesity. Dietary counseling Body mass index is 39.01 kg/m.  20. Bradycardic: continue to monitor HR TID  -Discontinue Geodon  -9/19 heart rate in the 80s, stable  HR stable  21. HTN: resolved after HD, continue to monitor TID  9/19-20 intermittently elevated, continue to monitor.  Cautious with medication addition to avoid hypotension  9/25 BP mildly elevated at times, continue current regimen   22. Hematuria  9/22- required in/out to check U/A- having new hematuria- nursing to keep an eye- since also bleeding from penis a little- monitor   9/23 no wBCs  on UA, follow  urine culture-Staphylococcus hemolyticus-discussed with pharmacy and ID pharmacy.  Think this is more likely contaminant  9/25 discuss with nephrology about possible treatment or other evaluation options      LOS: 30 days A FACE TO FACE EVALUATION WAS PERFORMED  Fanny Dance 04/11/2023, 10:42 AM

## 2023-04-11 NOTE — Progress Notes (Signed)
Patient came up awake at the beginning of Dialysis.  Patient in soft restraints per dialysis department protocol for patient's safety and so he does not pull out dialysis lines.  Had to give ativan IV early to calm patient down.  Patient observed as agitated and hollers out for wife and other people by name.  [name] [name], KDU

## 2023-04-11 NOTE — Progress Notes (Signed)
This chaplain is responding to PMT consult for support for Pt. Wife. This chaplain reviewed the chart notes and received an update from PMT NP-Michelle.   The Pt. nor the Pt. wife-Mary are present at the time of the visit. This chaplain will continue to interact with PMT for best time to visit.  Chaplain Stephanie Acre [phone]

## 2023-04-11 NOTE — Plan of Care (Signed)
  Problem: Consults Goal: RH GENERAL PATIENT EDUCATION Description: See Patient Education module for education specifics. Outcome: Progressing   Problem: RH BOWEL ELIMINATION Goal: RH STG MANAGE BOWEL WITH ASSISTANCE Description: STG Manage Bowel with mod I Assistance. Outcome: Progressing Goal: RH STG MANAGE BOWEL W/MEDICATION W/ASSISTANCE Description: STG Manage Bowel with Medication with mod I Assistance. Outcome: Progressing   Problem: RH SAFETY Goal: RH STG ADHERE TO SAFETY PRECAUTIONS W/ASSISTANCE/DEVICE Description: STG Adhere to Safety Precautions With cues Assistance/Device. Outcome: Progressing   Problem: RH PAIN MANAGEMENT Goal: RH STG PAIN MANAGED AT OR BELOW PT'S PAIN GOAL Description: < 4 with prns Outcome: Progressing   Problem: RH KNOWLEDGE DEFICIT GENERAL Goal: RH STG INCREASE KNOWLEDGE OF SELF CARE AFTER HOSPITALIZATION Description: Patient and wife will be able to manage care at discharge with educational resources independently Outcome: Progressing   Problem: Safety: Goal: Non-violent Restraint(s) Outcome: Progressing   Problem: Education: Goal: Ability to describe self-care measures that may prevent or decrease complications (Diabetes Survival Skills Education) will improve Outcome: Progressing Goal: Individualized Educational Video(s) Outcome: Progressing   Problem: Coping: Goal: Ability to adjust to condition or change in health will improve Outcome: Progressing   Problem: Fluid Volume: Goal: Ability to maintain a balanced intake and output will improve Outcome: Progressing   Problem: Health Behavior/Discharge Planning: Goal: Ability to identify and utilize available resources and services will improve Outcome: Progressing Goal: Ability to manage health-related needs will improve Outcome: Progressing   Problem: Metabolic: Goal: Ability to maintain appropriate glucose levels will improve Outcome: Progressing   Problem: Nutritional: Goal:  Maintenance of adequate nutrition will improve Outcome: Progressing Goal: Progress toward achieving an optimal weight will improve Outcome: Progressing   Problem: Skin Integrity: Goal: Risk for impaired skin integrity will decrease Outcome: Progressing   Problem: Tissue Perfusion: Goal: Adequacy of tissue perfusion will improve Outcome: Progressing   Problem: Education: Goal: Knowledge of General Education information will improve Description: Including pain rating scale, medication(s)/side effects and non-pharmacologic comfort measures Outcome: Progressing   Problem: Health Behavior/Discharge Planning: Goal: Ability to manage health-related needs will improve Outcome: Progressing

## 2023-04-11 NOTE — Progress Notes (Signed)
Physical Therapy Session Note  Patient Details  Name: [name] [name] MRN: [id number] Date of Birth: 29-Jun-1951  Today's Date: 04/11/2023 PT Missed Time: 45 Minutes Missed Time Reason: Other (Comment);Unavailable (Comment) (HD)  Short Term Goals: Week 4:  PT Short Term Goal 1 (Week 4): Pt will complete transfers min assist consistently. PT Short Term Goal 2 (Week 4): Pt will complete gait 50' with min assist consistently PT Short Term Goal 3 (Week 4): Pt will complete up/down 1 step with min assist  Skilled Therapeutic Interventions/Progress Updates:      Therapy Documentation Precautions:  Precautions Precautions: Fall Restrictions Weight Bearing Restrictions: No General: PT Amount of Missed Time (min): 45 Minutes PT Missed Treatment Reason: Other (Comment);Unavailable (Comment) (HD)  Pt missed 45 minutes of skilled PT as of unit for HD, plan to make up missed minutes as able.     Therapy/Group: Individual Therapy  Truitt Leep 04/11/2023, 4:50 PM

## 2023-04-11 NOTE — Progress Notes (Addendum)
Palliative Medicine Inpatient Follow Up Note HPI: 72 y.o. male with past medical history of CAD, PUD, DM, OAS, HLD, ESRD who was on PD which was removed on 02/19/23, LUE AVF in place, IR was consulted for image guided tunneled HD cath placement.   Today's Discussion 04/11/2023  *Please note that this is a verbal dictation therefore any spelling or grammatical errors are due to the "Dragon [employer] One" system interpretation.  Chart reviewed inclusive of vital signs, progress notes, laboratory results, and diagnostic images.   I spoke to patients RN, [name] [name] who shares that [name] [name] has been appropriate this morning though she has medicated him to reduce agitation episodes which has worked well.   I met with [name] [name] at bedside this morning. He is sitting in his wheelchair, calm and cooperative. He is in no distress.   I called patients spouse, [name] [name]. Discussed the concerns regarding [name] [name] in the setting of his ongoing behavioral symptoms and tolerance to iHD. We reviewed that we will continue to try and make sure that he has ativan preceding treatment.   Created space and opportunity for patients spouse to explore thoughts feelings and fears regarding [name] [name]'s current medical situation. She expresses that she feels he does do well when his behavioral symptoms are well controlled. She feels he tends to do best when he has ativan before treatments. She is hopeful that the medical team can find a way to help patient better tolerate treatments.   Questions and concerns addressed/Palliative Support Provided.   Objective Assessment: Vital Signs Vitals:   04/11/23 0547 04/11/23 1153  BP: (!) 164/97 126/83  Pulse: 82 73  Resp: 15 12  Temp: 97.7 F (36.5 C) 97.8 F (36.6 C)  SpO2: 92% 90%    Intake/Output Summary (Last 24 hours) at 04/11/2023 1205 Last data filed at 04/11/2023 1610 Gross per 24 hour  Intake 840 ml  Output --  Net 840 ml   Last Weight  Most recent update: 04/11/2023 11:52 AM    Weight   90.4 kg (199 lb 4.7 oz)            Gen:  Elderly AA M in NAD HEENT: moist mucous membranes CV: Regular rate and rhythm  PULM: On RA, breathing is even and nonlabored ABD: soft/nontender  EXT: No edema  Neuro: Alert and oriented   SUMMARY OF RECOMMENDATIONS   DNAR/DNI  Goal to continue iHD --> Have spoken to nephrology and nursing about pre-medicating for symptoms 30 minutes - 1 hour before treatments to see if it enhances tolerance with below medications: - Continue ativan 0.5mg  PO Q6H PRN  - Continue Depakote 125mg  TID  - Have requested iHD is either at bedside or that patient wife can go with him to tx to see if this too may help his behaviors, The Nephrology team is trying to see if this would be possible.  Allowing time for outcomes  Appreciate ongoing Chaplain support  Ongoing Palliative care support - I will not be present tomorrow though my colleague, [name] [name] will check in on Friday  Billing based on MDM: High ______________________________________________________________________________________ [name] [name] [name] [name] Palliative Medicine Team Team Cell Phone: [phone] Please utilize secure chat with additional questions, if there is no response within 30 minutes please call the above phone number  Palliative Medicine Team providers are available by phone from 7am to 7pm daily and can be reached through the team cell phone.  Should this patient require assistance outside of these hours, please call the patient's  attending physician.

## 2023-04-12 NOTE — Progress Notes (Signed)
Physical Therapy Session Note  Patient Details  Name: [name] [name] MRN: [id number] Date of Birth: 1950/10/29  Today's Date: 04/12/2023 PT Individual Time: 1015-1045 PT Individual Time Calculation (min): 30 min   Short Term Goals: Week 4:  PT Short Term Goal 1 (Week 4): Pt will complete transfers min assist consistently. PT Short Term Goal 2 (Week 4): Pt will complete gait 50' with min assist consistently PT Short Term Goal 3 (Week 4): Pt will complete up/down 1 step with min assist  Skilled Therapeutic Interventions/Progress Updates:    Pt presents in room in [address] [facility] with family present, agreeable to PT, denies pain. Session focused on gait training for tolerance to upright and dynamic postural stability. Pt with improved ability to follow commands this session and decreased lethargy however does require occasional hand over hand assist with verbal cueing to facilitate initiation.  Pt transported to dayroom in [facility] dependently for time management and energy conservation. Pt ambulates 42' and 41' with R HHA CGA/min assist. Verbal cues to promote step length and step height with pt demonstrating understanding initially, verbal cues provided to prevent pt reaching for objects on L side for stability. Pt demonstrates improved ability to recognize instability and takes standing rest breaks without prompting when fatigue. 2nd person [facility] follow for safety due to pt impulsivity. Pt requires extended seated rest breaks between trials for fatigue.  Pt returns to room and remains seated in [facility] with all needs within reach, cal light in place and chair alarm donned and activated at end of session. Pt family members present in room at end of session.   Therapy Documentation Precautions:  Precautions Precautions: Fall Restrictions Weight Bearing Restrictions: No    Therapy/Group: Individual Therapy  Edwin Cap PT, DPT 04/12/2023, 3:38 PM

## 2023-04-12 NOTE — Progress Notes (Addendum)
Speech Language Pathology Daily Session Note  Patient Details  Name: [name] [name] MRN: [id number] Date of Birth: 11-07-1950  Today's Date: 04/12/2023 SLP Individual Time: 4098-1191 SLP Individual Time Calculation (min): 39 min  Short Term Goals: Week 5: SLP Short Term Goal 1 (Week 5): Patient will utilize external aids to orient to time and situation given modA SLP Short Term Goal 2 (Week 5): Pt will complete functional problem solving with 50% acc with max A. SLP Short Term Goal 3 (Week 5): Pt will attend to functional task for 5-7 minutes given mod multimodal cues. SLP Short Term Goal 4 (Week 5): Pt will answer biographical questions via multimodal communication accurately with 75% acc with mod A  Skilled Therapeutic Interventions: SLP conducted skilled therapy session targeting cognitive retraining goals. SLP received patient upright in wheelchair eating breakfast with daughter, granddaughter, and chaplain present. Chaplain reported that patient responds well to gospel music and family reports it helps to keep him calm. SLP left gospel music playing throughout session, and while music frequently served as a distraction from therapy tasks, patient was redirectable with increased participation throughout compared to previous sessions. During breakfast, patient required supervision/verbal cues to sequence meal and drink from cup (attempted to drink from cup with lid on vs. Utilizing available straw). Patient continues to be disoriented to time and location, requiring maxA to utilize external aids. Patient required maxA to attend to functional card sorting task given presence of calming music in the background. Once attentive to task, patient identified suit of each card independently but required maxA to place each card in appropriate pile and required reorientation to task x2. Patient then became somnolent, not easily roused to verbal stimuli. Patient eventually minimally roused, but indicated he  was finished with speech therapy for the day. Patient was left in chair with call bell in reach and chair alarm set. SLP will continue to target goals per plan of care.      Pain Pain Assessment Pain Scale: Faces Pain Score: 0-No pain Faces Pain Scale: Hurts little more Pain Location: Back  Therapy/Group: Individual Therapy  [name], M.A., CCC-SLP  [name] 04/12/2023, 12:26 PM

## 2023-04-12 NOTE — Plan of Care (Signed)
  Problem: Consults Goal: RH GENERAL PATIENT EDUCATION Description: See Patient Education module for education specifics. Outcome: Progressing   Problem: RH BOWEL ELIMINATION Goal: RH STG MANAGE BOWEL WITH ASSISTANCE Description: STG Manage Bowel with mod I Assistance. Outcome: Progressing Goal: RH STG MANAGE BOWEL W/MEDICATION W/ASSISTANCE Description: STG Manage Bowel with Medication with mod I Assistance. Outcome: Progressing   Problem: RH SAFETY Goal: RH STG ADHERE TO SAFETY PRECAUTIONS W/ASSISTANCE/DEVICE Description: STG Adhere to Safety Precautions With cues Assistance/Device. Outcome: Progressing   Problem: RH PAIN MANAGEMENT Goal: RH STG PAIN MANAGED AT OR BELOW PT'S PAIN GOAL Description: < 4 with prns Outcome: Progressing   Problem: RH KNOWLEDGE DEFICIT GENERAL Goal: RH STG INCREASE KNOWLEDGE OF SELF CARE AFTER HOSPITALIZATION Description: Patient and wife will be able to manage care at discharge with educational resources independently Outcome: Progressing   Problem: Safety: Goal: Non-violent Restraint(s) Outcome: Progressing   Problem: Education: Goal: Ability to describe self-care measures that may prevent or decrease complications (Diabetes Survival Skills Education) will improve Outcome: Progressing Goal: Individualized Educational Video(s) Outcome: Progressing   Problem: Coping: Goal: Ability to adjust to condition or change in health will improve Outcome: Progressing   Problem: Fluid Volume: Goal: Ability to maintain a balanced intake and output will improve Outcome: Progressing   Problem: Health Behavior/Discharge Planning: Goal: Ability to identify and utilize available resources and services will improve Outcome: Progressing Goal: Ability to manage health-related needs will improve Outcome: Progressing   Problem: Metabolic: Goal: Ability to maintain appropriate glucose levels will improve Outcome: Progressing   Problem: Nutritional: Goal:  Maintenance of adequate nutrition will improve Outcome: Progressing Goal: Progress toward achieving an optimal weight will improve Outcome: Progressing   Problem: Skin Integrity: Goal: Risk for impaired skin integrity will decrease Outcome: Progressing   Problem: Tissue Perfusion: Goal: Adequacy of tissue perfusion will improve Outcome: Progressing   Problem: Education: Goal: Knowledge of General Education information will improve Description: Including pain rating scale, medication(s)/side effects and non-pharmacologic comfort measures Outcome: Progressing   Problem: Health Behavior/Discharge Planning: Goal: Ability to manage health-related needs will improve Outcome: Progressing

## 2023-04-12 NOTE — Progress Notes (Signed)
Patient ID: [name] [name], male   DOB: December 22, 1950, 72 y.o.   MRN: [id number]  [name] #[id number] H  SW spoke with pt wife about bed offers. Prefers a location in [place] if possible. SW encouraged her to look at [facility], and informed other bed offers are in [place]. She intends to visit today. Wife is willing to transport to dialysis if the facility is not able to provide transportation.   SW left message for Debbie/Admissions with [facility] to discuss referral.   [id number]-SW spoke with pt wife in length about plan of care given at this time unable to get pt into HD clinic due to behaviors. Wife prefers for him to go SNF, but understands if he has to come home. She mentioned there have been discussion about home with hospice. Not indicated from palliative care note. She states if he has to come home, she does not want him home with hospice. She understands all updates are being provided to medical team, and working on best plan of care.  Per [facility] pt is not appropriate for outpatient HD due to behaviors, and not appropriate for PD. Continuing to work on best medication regimen to allow behaviors to be resolved, and for pt to be calm during treatment.   [id number]-SW called pt wife to inform on above, and will update as there is more information available.   Medical team aware on above updates.   Cecile Sheerer, MSW, LCSWA Office: [phone] Cell: [phone] Fax: [phone]

## 2023-04-12 NOTE — Progress Notes (Addendum)
This chaplain is able to begin building rapport with the Pt., the Pt. daughter-Terry, and Pt. granddaughter. The Pt. is feeding himself [employer] breakfast as the family talks to the chaplain about the Pt.   The chaplain understands the family often takes the Pt. interactive lead, if the Pt. wants to talk or be left alone. The Pt. favorite music (dry erase board) also helps the Pt. relax. The chaplain observed the Pt. tearfulness as the family was talking about how much they love him.  The chaplain understands the Pt. wife-Mary will be visiting at the bedside between 2p-4p today. This chaplain will attempt a revisit with [facility].  **1451 The chaplain attempted a spiritual care visit with Pt. Wife-Mary. [name] is not present at the time of the visit.  Chaplain Stephanie Acre [phone]

## 2023-04-12 NOTE — Progress Notes (Signed)
PROGRESS NOTE   Subjective/Complaints:  No new concerns this morning.  Patient's granddaughters are here visiting.   Nephrology to try premedicating patient before hemodialysis.  He did have continued agitation with dialysis yesterday.  ZOX:[id number] due to cognition/behavioral   Objective:   No results found. Recent Labs    04/11/23 1008  WBC 4.5  HGB 9.5*  HCT 31.9*  PLT 186      Recent Labs    04/11/23 1008  NA 136  K 4.2  CL 97*  CO2 25  GLUCOSE 152*  BUN 45*  CREATININE 12.26*  CALCIUM 9.1       Intake/Output Summary (Last 24 hours) at 04/12/2023 1707 Last data filed at 04/12/2023 1045 Gross per 24 hour  Intake 480 ml  Output --  Net 480 ml          Physical Exam: Vital Signs Blood pressure 113/73, pulse 72, temperature 98.4 F (36.9 C), temperature source Oral, resp. rate 19, height 5\' 4"  (1.626 m), weight 88.9 kg, SpO2 95%.     General: NAD HENT: conjugate gaze; oropharynx moist CV: regular rate; no JVD Pulmonary: CTA B/L; no W/R/R- good air movement GI: soft, NT, ND, (+)BS Psychiatric: inappropriate, confused, pleasant  Ext: no clubbing, cyanosis, or edema Psych: Confused, not agitated at this time Neurologic Exam: Calm lying in bed Awake and alert Reports he remembers me from prior days Oriented to self, rehabilitation center + Memory deficit - ongoing  Sensory exam: Intact to touch in all 4 extremities Motor exam: moves all 4 extremities without limitiations Coordination: Slight bilateral upper extremity tremor, intention, mild.    Musculoskeletal: No joint swelling noted No abnormal tone      Assessment/Plan: 1. Functional deficits which require 15 hr per week of interdisciplinary therapy in a comprehensive inpatient rehab setting. Physiatrist is providing close team supervision and 24 hour management of active medical problems listed below. Physiatrist and rehab team  continue to assess barriers to discharge/monitor patient progress toward functional and medical goals  Care Tool:  Bathing    Body parts bathed by patient: Right arm, Left arm, Chest, Abdomen, Front perineal area, Right upper leg, Buttocks, Left upper leg, Right lower leg, Left lower leg, Face         Bathing assist Assist Level: Moderate Assistance - Patient 50 - 74%     Upper Body Dressing/Undressing Upper body dressing   What is the patient wearing?: Pull over shirt    Upper body assist Assist Level: Minimal Assistance - Patient > 75%    Lower Body Dressing/Undressing      Lower body dressing      What is the patient wearing?: Underwear/pull up, Pants     Lower body assist Assist for lower body dressing: Maximal Assistance - Patient 25 - 49%     Toileting Toileting    Toileting assist Assist for toileting: Maximal Assistance - Patient 25 - 49%     Transfers Chair/bed transfer  Transfers assist     Chair/bed transfer assist level: Minimal Assistance - Patient > 75%     Locomotion Ambulation   Ambulation assist      Assist level: Minimal Assistance -  Patient > 75% Assistive device: No Device Max distance: 175'   Walk 10 feet activity   Assist     Assist level: Contact Guard/Touching assist Assistive device: No Device   Walk 50 feet activity   Assist Walk 50 feet with 2 turns activity did not occur: Safety/medical concerns  Assist level: Minimal Assistance - Patient > 75% Assistive device: No Device    Walk 150 feet activity   Assist Walk 150 feet activity did not occur: Safety/medical concerns  Assist level: Minimal Assistance - Patient > 75% Assistive device: No Device    Walk 10 feet on uneven surface  activity   Assist Walk 10 feet on uneven surfaces activity did not occur: Safety/medical concerns         Wheelchair     Assist Is the patient using a wheelchair?: Yes Type of Wheelchair: Manual    Wheelchair  assist level: Dependent - Patient 0%      Wheelchair 50 feet with 2 turns activity    Assist        Assist Level: Dependent - Patient 0%   Wheelchair 150 feet activity     Assist      Assist Level: Dependent - Patient 0%   Blood pressure 113/73, pulse 72, temperature 98.4 F (36.9 C), temperature source Oral, resp. rate 19, height 5\' 4"  (1.626 m), weight 88.9 kg, SpO2 95%.  Medical Problem List and Plan: 1. Functional deficits secondary to metabolic encephalopathy due to sepsis secondary to catheter peritonitis and hypoxemia              -patient may not shower             -ELOS/Goals: 16-18 days, PT/OT Sup, SLP min A -tentative discharge date 9-9           -Continue CIR therapies including PT, OT, and SLP   -Will review records from [facility] -- has worsened since approximately 8/22, possible 8/19, consistent with records from nursing at [facility] with current behavior.   -Therapy QD schedule  -Therapy suggest having patient set up for short time before his therapy sessions to help him wake up.  I think this would be a good idea  Con't CIR PT, OT and SLP  Patient was seen by palliative care, changed to DNR status  -SNF vs Home discharge  2.  Antithrombotics: -DVT/anticoagulation:  Pharmaceutical: Heparin, 03/23/23 changed to Lovenox yesterday to help improved compliance             -antiplatelet therapy: aspirin and Plavix   3. Pain Management: Tylenol as needed             -Lidoderm patches to back  -Elavil 25 mg at bedtime for nerve pain and sleep 8/27  -8/29: Pain well controlled on Elavil; DC lyrica, PRN Tylenol/oxy 2.5-5 mg d/t CKD  8-30: Appears patient has been getting pain medications together overnight along with antipsychotics, will discuss with nursing  8-31: Patient having an difficulty endorsing needs and not utilizing PRNs, scheduled oxycodone 5 mg 3 times daily and has 2.5 mg prn 3 times daily available.   9-1: Not utilizing PRNs, minimal complaints  of pain, will discontinue scheduled given lethargy today..  -muscle rub R shoulder  9/16 patient reports pain is under control overall   9/26 no report of pain noted 4. Mood/Behavior/Sleep: LCSW to evaluate and provide emotional support             -continue Seroquel 50 mg TID             -  clonazepam 0.5 mg TID prn             -Geodon 10 mg IM q 12 hours prn             -antipsychotic agents: n/a  -8-27: Agitated by soft restraints in bed, transition to 4 side rails, Posey alarm, and TeleSitter for now.  Veil bed on standby if needed, however feel we should avoid this if possible due to likelihood of agitating patient further   - 8/28: Placed in veil bed; wrist restraints for dialysis. Scheduled Geodon 20 mg twice daily; discontinue Seroquel and clonazepam due to no perceivable benefit. Add as needed Zyprexa 5 mg twice daily p.o. or IM due to recent cardiac arrhythmias and concern for QTc prolongation. As needed ramelteon for sleep. Stop gabapentin due to possible side effect of agitation  - 8/28: No improvement except temporarily with Geodon; add depakote 125 mg q8h for ongoing aggitation, mood stabilization. Increase elavil to 50 mg at bedtime.  Workup ongoing.    - 8/29: EEG negative, labs normal. MRI pending. Sleeping well and calm since starting depakote and increase in elavil. Will monitor tonight and wean Geodon in AM if appropriate.   8-30: MRI showing chronic microvascular changes, otherwise normal.  Increasingly calm and appropriate during the day, with ongoing agitation at nighttime.  Will increase nighttime Geodon to 40 mg; will discuss sleep log with nursing, as it has not been performed.  8-31 no sleep overnight per sleep log, again getting agitated around 9 PM and 5 AM.  Not responsive to as needed Zyprexa, responsive to Ativan.  Attempted to wean off scheduled a.m. Geodon today, but patient became extremely agitated and unable to redirect, calm down with readministration of medication.   Resume Geodon 20 mg every morning/40 mg every afternoon, increased Elavil to 75 mg nightly, adding trazodone 50 mg nightly as needed.  P.o./IV Ativan 0.5 mg every 6 hours for agitation.  9-1: slept well overnight with trazodone and increased Elavil, no PRNs used.  Unfortunately, oversedated today.  Discontinue scheduled pain medications as above, start wean of Elavil to 50 mg x2 days -> 25 mg x2 days -> 10 mg x2 days, off due to no apparent benefit and no complaints of pain.  Reduce Geodon from 20 mg every morning/40 mg every evening to 20 mg twice daily.  Continue trazodone as needed for sleep, as this seems to be the difference for a restful night yesterday.  If he remains overly sedated, would dose reduce and DC Depakote next.  Keep Ativan as needed d/t dialysis not having other options available.  9/2-3--non agitated but still sleepy   -dc'ed depakote as I doubt it's doing much at this dose anyway   -continue geodon at 20mg  bid   -scheduled trazodone at HS with back up dose if needed    -sleep was intermittent last night   -requires enclosure bed for safety  9/4 continue enclosure bed for now, consider trying DC of this device early next week  -9/5 appears to be doing better overall this morning, continues to have intermittent agitation at times.  Continue enclosure bed  -9/6 agitation appears to be improving, patient is skeptical about medications and has been declining some of these.  Will discontinue multivitamin, RisaQuad to try to decrease pill burden  -9/7 continues to have intermittent episodes of agitation however overall improving, appears to be taking most of his medications  9/9 agitation and mental status appears to be improving -discussing discontinuation of  enclosure bed with team 9/10 Geodon dose increased to 40 mg at night, continue 20 mg in the morning 9/11 No acute events noted, will check with team regarding DC enclosure bed, nursing reports he did ok last night but had  dialysis 9/12 continues to have sundowning/waxing waning confusion and agitation, continue enclosure bed for now, will caution with Geodon will continue current dose but hold off on any escalation as this can contribute to bradycardia 9/16 no longer using enclosure bed, continue Geodon 20 mg twice daily for now due to intermittent agitation 9/17 patient with agitation and confusion at night, has sedation during the day.  Discussed with psychiatry Dr. Gasper Sells.  Will discontinue Geodon and try Abilify tonight.  Dr. Gasper Sells also suggested considering donepezil to shorten hallucinations of delirium.  Psychiatry consult placed, will see tomorrow.  Appreciate assistance 9/18 no behavioral related notes overnight, psychiatry plans to see him today.  Appreciate assistance 9/19 patient was seen by psychiatry yesterday, recommended continue Abilify, consider donepezil or memantine, suggested avoiding stimulants.  Will continue current regimen for now as it appears he is doing better.  He appears to be less sedated and did not see frequent notes of agitation but this regimen thus far. 9/20 recheck CMP/ammonia/VPA level today due to continued drowsiness  9/21- Depakote level 16- too low- Ammonia 19- low- asleep this AM, but haven't been called today about sedation/lethargy.  9/22- more hallucinations per nursing- labs attempted- couldn't get- couldn't get U/A results since so much hematuria. Trying to get labs, to see if getting ill vs missing HD? 9/23 will go back to 3 times daily for Depakote to help avoid agitation 9/24 continue current regimen with Abilify and start memantine 5 mg daily 9/25 monitor response to memantine addition 9/26 consider increase Abilify dose tomorrow 5. Neuropsych/cognition: This patient is not capable of making decisions on his own behalf   -recommend neuropsych consult   - 8/27: Ongoing aggitation; Consult hospitalist for further workup on potential contributors to  encephalopathy. Pending UA, LA 2.1-> 1.4; otherwise negative so far -Delirium precautions, sleep log, Veiled bed for safety and to minimize restraints -Wife has expressed that she is not interested in palliative care at this time; hopefully with above measures patient can get dialysis in the next 48 hours to limit worsening uremic encephalopathy and medical decline    - 8/29: Aggitation much improved, able to get some dialysis last night and today. See above for medication changes. Calling wife tonight to discuss.   8-30: Still with moderate cognitive deficits, however appears once patient is less agitated he is better able to concentrate and has better orientation.  MRI negative for acute findings, monitor.  9/3 agitation better. More sedated than anything at this point. Confused as well.    -depakote stopped   -continue other meds as rx'ed  9/21- pt refused Depakote this AM "since doesn't have seizures".   9/22- per nursing, attempted 3 different times yesterday and today to give Depakote 6. Skin/Wound Care: Routine skin care checks             -sacral skin off-loading/protection   7. Fluids/Electrolytes/Nutrition: Strict Is and Os and follow-up chemistries per nephrology             -renal diet/ 1200 cc FR  - Renvela 800 mg 3 times daily for hyperphosphatemia added per nephrology 8-27   9/21- Last Cr 7.07 and BUN 22 8: Hypotension: monitor TID and prn             -  continue midodrine as needed during dialysis  Normotensive, monitor  9-1: Consistently 90s over 60s, has been stable but given findings of microvascular changes on MRI, may be too low for patient to perfuse properly.  Will add midodrine 5 mg tid and monitor.  DC Coreg as heart rate has been low to normal; may resume at low-dose once blood pressure improved.   9/2 continue with ccurrent regimen. Bp's hr remain low   -elavil wean should help  9/9-10 BP stable, continue midodrine and monitor  9/13 DC midodrine today as BP has been  higher, can restart if needed  Improved, cont to monitor  9/26 BP stable overall, continue to monitor    04/12/2023    3:17 PM 04/12/2023    7:00 AM 04/12/2023    6:35 AM  Vitals with BMI  Weight  196 lbs   Systolic 113  140  Diastolic 73  77  Pulse 72  65      9: Hyperlipidemia: continue statin   10: CAD s/p CABG 2023: on DAPT and statin.  - troponin elevated but stable, monitor 8/27   11: DM-2: A1c = 6.3% (at home on Lantus 22 units at bedtime, Tradjenta 5 mg daily)        -monitor PO intake             -continue SSI (0-6 units) does not appear to be requiring coverage   - 9/6 DC SSI, change CBGs to daily  9/8 discontinue CBGs  9/16 glucose still controlled on BMP yesterday   9/23 glucose 100 on BMP yesterday  12: ESRD: HD on Tues, Thurs, Sat -nephrology made aware   - Unable to tolerate 8/27 d/t aggitation, stopped early 8/28;  - attempting again 8/29, stopped early again -Per nephrology, redirectable with gum -Have to switch restraints during dialysis sessions from veil bed to regular bed, then resume veil bed order once at rehab -9/9 tolerating HD better 9/18 patient's wife indicates she would be able to stay with him during dialysis after discharge   9/22- pt refused/too agitated ot get HD yesterday  9/24 nephrology does not feel like he is a candidate for outpatient dialysis at this time due to agitation  9/26 patient has been developing agitation during hemodialysis, will see if dialysis in the morning would be a possibility as he seems to be more calm at this time 13: COPD/OSA; chronic home 2L O2 (Symbicort, Ventolin, prednisone 40 mg daily at home)             -continue symbicort  -Added home oxygen back 8-27   14: GERD: continue Protonix   15: Non-sustained VT/pAF/bradycardia: per cardiology, Dr. Algie Coffer: -continue carvedilol 6.25 mg BID; consider increasing to 12.5 mg BID before starting low dose amiodarone  -consider Eliquis and discontinue asa and Plavix in  one week if no active GI bleeding and stable hemoglobin from 8/24 -hemoglobin has remained stable but very low around 7, no recurrent A-fib, monitor for now given agitation/fall 8-31 and consider initiating Eliquis when safe -Continue mag-ox to avoid hypomagnesemia -8-27: Single instance of bradycardia this morning 40s, may have been missed read, monitor through today and do not further adjust beta-blocker medications  -9-3 hr controlled after holding coreg -9/4-5 HR stable, continue to monitor -9/9 consider Eliquis this week if agitation continues to improve -9/10 will hold off on Eliquis due to sundowning at night, will see how he does with increase Geodon -9/11 EKG for bradycardia episode today, 2nd degree A-V block  mobitz type 1.  Continue to hold beta blocker,9/11 will restart eliquis at 2.5 mg BID dose. Will consult cardiology regarding need for ASA/plavix and coreg.  Dr. Elease Hashimoto does not appear convinced that he has A-fib 9/12 appreciate cardiology consult yesterday, DAPT transition to daily aspirin 81 only and Eliquis was discontinued, caution with Geodon as this can contribute to bradycardia 9/13 will decrease Geodon nighttime dose to 20 due to episodes of bradycardia 9/17 will discontinue Geodon due to variable cardiac effects 9/24 bradycardia continues to be improved after Geodon discontinued     04/12/2023    3:17 PM 04/12/2023    7:00 AM 04/12/2023    6:35 AM  Vitals with BMI  Weight  196 lbs   Systolic 113  140  Diastolic 73  77  Pulse 72  65       16: HFrEF: Daily weights. Monitor for signs of overload  - 9/7 continue daily weights, fluid level management per hemodialysis team Weight reviewed and stable  9/21- 94.3- down 3 kg-   9/22- no weight for today- too agitated per nursing Filed Weights   04/11/23 1152 04/11/23 1628 04/12/23 0700  Weight: 90.4 kg 88.4 kg 88.9 kg      17: Liver nodularity on CT scan: LFTs reported to be normal. Avoid hepatotoxic medications     -Abdominal ultrasound earlier this admission with minimal ascites; ammonia within normal limits  -8/31: Did discuss with nephrology whether p.o. lactulose would be beneficial despite normal ammonia, after reviewing records unsure that this would be beneficial    18: Epistaxis - resolved             -previously improved with flonase and saline spray   19: Morbid Obesity. Dietary counseling Body mass index is 39.01 kg/m.  20. Bradycardic: continue to monitor HR TID  -Discontinue Geodon  -9/19 heart rate in the 80s, stable  HR stable  21. HTN: resolved after HD, continue to monitor TID  9/19-20 intermittently elevated, continue to monitor.  Cautious with medication addition to avoid hypotension  9/25 BP controlled overall   22. Hematuria  9/22- required in/out to check U/A- having new hematuria- nursing to keep an eye- since also bleeding from penis a little- monitor   9/23 no wBCs  on UA, follow  urine culture-Staphylococcus hemolyticus-discussed with pharmacy and ID pharmacy.  Think this is more likely contaminant  9/25 discuss with nephrology about possible treatment or other evaluation options  Spoke with Dr. Ronalee Belts, most likely contaminant.  Recommended treating only if symptomatic.  If hematuria continues consider urology input  LOS: 31 days A FACE TO FACE EVALUATION WAS PERFORMED  Fanny Dance 04/12/2023, 5:07 PM

## 2023-04-12 NOTE — Progress Notes (Signed)
[facility] Progress Note   Subjective:  Seen in room. Family visiting. HD yesterday --continued agitation during treatments requiring restraints and sedating medications. Family updated. They are all willing to sit with patient for dialysis.    Objective Vitals:   04/11/23 1947 04/11/23 2010 04/12/23 0635 04/12/23 0700  BP: (!) 104/94  (!) 140/77   Pulse: (!) 110  65   Resp: 17  15   Temp: (!) 97.5 F (36.4 C)  97.7 F (36.5 C)   TempSrc:      SpO2: 97% 97%    Weight:    88.9 kg  Height:       Physical Exam General: elderly man, nad  Heart: S1,S2 RRR Lungs: CTAB A/P Abdomen: NABS NT Extremities: No LE edema Dialysis Access: L AVF + T/B  Additional Objective Labs: Basic Metabolic Panel: Recent Labs  Lab 04/08/23 1517 04/09/23 1219 04/11/23 1008  NA 139 138 136  K 3.9 4.1 4.2  CL 96* 99 97*  CO2 26 26 25   GLUCOSE 105* 100* 152*  BUN 44* 51* 45*  CREATININE 10.93* 12.78* 12.26*  CALCIUM 9.0 9.0 9.1  PHOS 4.5 4.7* 4.7*   Liver Function Tests: Recent Labs  Lab 04/06/23 1330 04/08/23 1517 04/09/23 1219 04/11/23 1008  AST 43*  --   --   --   ALT 34  --   --   --   ALKPHOS 86  --   --   --   BILITOT 1.0  --   --   --   PROT 7.0  --   --   --   ALBUMIN 2.6* 2.5* 2.5* 2.4*   No results for input(s): "LIPASE", "AMYLASE" in the last 168 hours. CBC: Recent Labs  Lab 04/08/23 1517 04/09/23 1219 04/11/23 1008  WBC 4.4 3.9* 4.5  NEUTROABS 2.9  --   --   HGB 9.4* 9.6* 9.5*  HCT 31.5* 32.3* 31.9*  MCV 91.8 91.8 94.4  PLT 169 185 186   Blood Culture    Component Value Date/Time   SDES URINE, CATHETERIZED 04/08/2023 0915   SPECREQUEST  04/08/2023 0915    NONE Performed at [facility], [address] [place], [place] [address]    CULT >=100,000 COLONIES/mL STAPHYLOCOCCUS HAEMOLYTICUS (A) 04/08/2023 0915   REPTSTATUS 04/10/2023 FINAL 04/08/2023 0915    Cardiac Enzymes: No results for input(s): "CKTOTAL", "CKMB", "CKMBINDEX",  "TROPONINI" in the last 168 hours. CBG: No results for input(s): "GLUCAP" in the last 168 hours. Iron Studies: No results for input(s): "IRON", "TIBC", "TRANSFERRIN", "FERRITIN" in the last 72 hours. @lablastinr3 @ Studies/Results: No results found. Medications:    (feeding supplement) PROSource Plus  30 mL Oral BID BM   ARIPiprazole  5 mg Oral QHS   aspirin EC  81 mg Oral Daily   atorvastatin  80 mg Oral Daily   Chlorhexidine Gluconate Cloth  6 each Topical Q0600   darbepoetin (ARANESP) injection - DIALYSIS  150 mcg Subcutaneous Q Sun-1800   divalproex  125 mg Oral Q8H   feeding supplement  237 mL Oral BID WC   Gerhardt's butt cream  1 Application Topical QID   lidocaine  1 patch Transdermal Q2200   lidocaine  1 patch Transdermal Q24H   memantine  5 mg Oral Daily   mometasone-formoterol  2 puff Inhalation BID   pantoprazole  40 mg Oral BID   traZODone  50 mg Oral QHS     Dialysis Orders: Previously on CCPD thru [facility] -  changed to HD - will need outpatient HD unit established prior to d/c. 4hr, 3K, EDW ~96, L AVF   Assessment/Plan: 1. Debility - in CIR 2. Encephalopathy/agitation: Waxing/waning agitation, dementia sundowning. No acute findings on imaging. He continues to be disruptive during HD. Needs sitter. But sitters are not allowed in the hospital HD unit. Have d/w HD staff - they state that most if not all of his recent HD sessions have been problematic due to resistance and aggressive behavior. We have been using IV ativan to assist w/ getting him through dialysis. Use of IV Ativan can not be done in OP HD units. VIR  will not place catheter, agitation is not an indication to place cathter.  Rehab team adjusting meds.  3. ESRD: Transitioned from PD to HD recently on TTS schedule. Using old AVF which is still functional. Has been off schedule for HD this week d/t behavior. Had partial HD Mon, then HD Wed. Plan was to see if he would do better with dialysis in the  mornings and to attempt HD Fri morning. Notified that he has  a scheduled phone call with incarcerated family member tomorrow morning so won't be available for treatment. If labs are ok should be able to resume treatment on Saturday am. Family would prefer Saturday HD. Coordinate with rehab team so they know to premedicate before he comes to [facility].  4.Hypertension/volume: BP higher, midodrine 5mg  stopped. UF as tolerated.  5. Anemia: Hgb 9s --on Aranesp weekly. Last Tsat 38% with ferritin 2175.  No iron.  6. Metabolic bone disease: CorrCa ok, Phos acceptable. No VDRA. 7.  Nutrition:  Alb very low, continue supps.  Renal diet w/fluid restrictions.  8.  Hx NSVT: On Coreg 10.  Recent culture negative PD cath-related peritonitis and associated fungemia + enterococcus bacteremia - underwent PD cath removal, TDC placement and removal, temp HD cath placement  and removal, and line holiday. Completed IV unasyn, IV micafungin and cipro on 8/15. TDC not replaced cause he had functional AVF.   11. Hematuria - dirty UA. Coag neg staph on Ucx - defer to primary team  12. GOC - Seen by palliative care - Now DNR/DNI wants to continue dialysis.  13. Dispo: For SNF placement. Regarding outpatient dialysis placement- per renal navigator he will need new referral since he has been gone from his prior clinic so long. Family members all willing to sit with him during treatments.  At this time he is not appropriate for outpatient dialysis d/t ongoing agitation and requiring restraints and multiple staff to reorient during treatments.   [name] Blase PA-C [facility] 04/12/2023,10:01 AM

## 2023-04-12 NOTE — Plan of Care (Signed)
  Problem: Consults Goal: RH GENERAL PATIENT EDUCATION Description: See Patient Education module for education specifics. Outcome: Progressing   Problem: RH BOWEL ELIMINATION Goal: RH STG MANAGE BOWEL WITH ASSISTANCE Description: STG Manage Bowel with mod I Assistance. Outcome: Progressing Goal: RH STG MANAGE BOWEL W/MEDICATION W/ASSISTANCE Description: STG Manage Bowel with Medication with mod I Assistance. Outcome: Progressing   Problem: RH SAFETY Goal: RH STG ADHERE TO SAFETY PRECAUTIONS W/ASSISTANCE/DEVICE Description: STG Adhere to Safety Precautions With cues Assistance/Device. Outcome: Progressing   Problem: RH PAIN MANAGEMENT Goal: RH STG PAIN MANAGED AT OR BELOW PT'S PAIN GOAL Description: < 4 with prns Outcome: Progressing   Problem: RH KNOWLEDGE DEFICIT GENERAL Goal: RH STG INCREASE KNOWLEDGE OF SELF CARE AFTER HOSPITALIZATION Description: Patient and wife will be able to manage care at discharge with educational resources independently Outcome: Progressing   Problem: Safety: Goal: Non-violent Restraint(s) Outcome: Progressing   Problem: Education: Goal: Ability to describe self-care measures that may prevent or decrease complications (Diabetes Survival Skills Education) will improve Outcome: Progressing Goal: Individualized Educational Video(s) Outcome: Progressing   Problem: Coping: Goal: Ability to adjust to condition or change in health will improve Outcome: Progressing   Problem: Fluid Volume: Goal: Ability to maintain a balanced intake and output will improve Outcome: Progressing   Problem: Health Behavior/Discharge Planning: Goal: Ability to identify and utilize available resources and services will improve Outcome: Progressing Goal: Ability to manage health-related needs will improve Outcome: Progressing   Problem: Metabolic: Goal: Ability to maintain appropriate glucose levels will improve Outcome: Progressing   Problem: Nutritional: Goal:  Maintenance of adequate nutrition will improve Outcome: Progressing Goal: Progress toward achieving an optimal weight will improve Outcome: Progressing   Problem: Skin Integrity: Goal: Risk for impaired skin integrity will decrease Outcome: Progressing   Problem: Tissue Perfusion: Goal: Adequacy of tissue perfusion will improve Outcome: Progressing   Problem: Education: Goal: Knowledge of General Education information will improve Description: Including pain rating scale, medication(s)/side effects and non-pharmacologic comfort measures Outcome: Progressing   Problem: Health Behavior/Discharge Planning: Goal: Ability to manage health-related needs will improve Outcome: Progressing   Problem: RH BOWEL ELIMINATION Goal: RH STG MANAGE BOWEL WITH ASSISTANCE Description: STG Manage Bowel with mod I Assistance. Outcome: Progressing   Problem: RH SAFETY Goal: RH STG ADHERE TO SAFETY PRECAUTIONS W/ASSISTANCE/DEVICE Description: STG Adhere to Safety Precautions With cues Assistance/Device. Outcome: Progressing   Problem: RH BOWEL ELIMINATION Goal: RH STG MANAGE BOWEL W/MEDICATION W/ASSISTANCE Description: STG Manage Bowel with Medication with mod I Assistance. Outcome: Progressing   Problem: RH PAIN MANAGEMENT Goal: RH STG PAIN MANAGED AT OR BELOW PT'S PAIN GOAL Description: < 4 with prns Outcome: Progressing   Problem: Safety: Goal: Non-violent Restraint(s) Outcome: Progressing   Problem: Education: Goal: Ability to describe self-care measures that may prevent or decrease complications (Diabetes Survival Skills Education) will improve Outcome: Progressing   Problem: Tissue Perfusion: Goal: Adequacy of tissue perfusion will improve Outcome: Progressing

## 2023-04-12 NOTE — Progress Notes (Signed)
Pt's case discussed with rehab CSW and renal PA. Pt is currently not appropriate for out-pt HD. Will continue to follow and will assist with out-pt HD referral once pt is deemed appropriate by medical staff. Will assist as needed.   Olivia Canter Renal Navigator [phone]

## 2023-04-12 NOTE — Progress Notes (Signed)
Physical Therapy Session Note  Patient Details  Name: [name] [name] MRN: [id number] Date of Birth: 1951-01-10  Today's Date: 04/12/2023 PT Individual Time: 0900-0945 PT Individual Time Calculation (min): 45 min   Short Term Goals: Week 4:  PT Short Term Goal 1 (Week 4): Pt will complete transfers min assist consistently. PT Short Term Goal 2 (Week 4): Pt will complete gait 50' with min assist consistently PT Short Term Goal 3 (Week 4): Pt will complete up/down 1 step with min assist  Skilled Therapeutic Interventions/Progress Updates: Pt presents semi-sitting in bed facing wall.  Pt agreeable to participating w/ therapy, but stating "something off."  Pt moving slowly, requiring CGA for sup to sitting at EOB.  Pt sat EOB c/o weakness and requiring increased assist.  Pt stood x 1, but then sat abruptly, still c/o "not quite right."  Pt transferred bed > w/c w/ min A/HHA.  Pt set up for breakfast but slow performance, increased tremors R UE.  Pt does feed self, reaching w/ RUE.  At conclusion of session, pt taken to nurses station and finishing breakfast.  Chair alarm on.     Therapy Documentation Precautions:  Precautions Precautions: Fall Restrictions Weight Bearing Restrictions: No General:   Vital Signs: Therapy Vitals Temp: 97.7 F (36.5 C) Pulse Rate: 65 Resp: 15 BP: (!) 140/77 Patient Position (if appropriate): Lying Pain:states back pain starting, but not rated.       Therapy/Group: Individual Therapy  Lucio Edward 04/12/2023, 9:45 AM

## 2023-04-13 ENCOUNTER — Encounter (HOSPITAL_COMMUNITY): Payer: Medicare Other

## 2023-04-13 DIAGNOSIS — Z515 Encounter for palliative care: Secondary | ICD-10-CM | POA: Diagnosis not present

## 2023-04-13 DIAGNOSIS — R319 Hematuria, unspecified: Secondary | ICD-10-CM

## 2023-04-13 DIAGNOSIS — R451 Restlessness and agitation: Secondary | ICD-10-CM

## 2023-04-13 DIAGNOSIS — G9341 Metabolic encephalopathy: Secondary | ICD-10-CM | POA: Diagnosis not present

## 2023-04-13 DIAGNOSIS — Z7189 Other specified counseling: Secondary | ICD-10-CM | POA: Diagnosis not present

## 2023-04-13 MED ORDER — CHLORHEXIDINE GLUCONATE CLOTH 2 % EX PADS: 6.0000 | MEDICATED_PAD | Freq: Every day | CUTANEOUS | Status: DC

## 2023-04-13 MED ORDER — ARIPIPRAZOLE 5 MG PO TABS
7.5000 mg | ORAL_TABLET | Freq: Every day | ORAL | Status: DC
  Administered 2023-04-13 – 2023-04-26 (×13): 7.5 mg via ORAL
  Filled 2023-04-13 (×16): qty 2

## 2023-04-13 NOTE — Progress Notes (Addendum)
Daily Progress Note   Patient Name: [name] [name]       Date: 04/13/2023 DOB: 27-Sep-1950  Age: 72 y.o. MRN#: [id number] Attending Physician: Fanny Dance, MD Primary Care Physician: Benetta Spar, MD Admit Date: 03/12/2023  Reason for Consultation/Follow-up: Establishing goals of care   Length of Stay: 32  Current Medications: Scheduled Meds:   (feeding supplement) PROSource Plus  30 mL Oral BID BM   ARIPiprazole  7.5 mg Oral QHS   aspirin EC  81 mg Oral Daily   atorvastatin  80 mg Oral Daily   Chlorhexidine Gluconate Cloth  6 each Topical Q0600   darbepoetin (ARANESP) injection - DIALYSIS  150 mcg Subcutaneous Q Sun-1800   divalproex  125 mg Oral Q8H   feeding supplement  237 mL Oral BID WC   Gerhardt's butt cream  1 Application Topical QID   lidocaine  1 patch Transdermal Q2200   lidocaine  1 patch Transdermal Q24H   memantine  5 mg Oral Daily   mometasone-formoterol  2 puff Inhalation BID   pantoprazole  40 mg Oral BID   traZODone  50 mg Oral QHS    Continuous Infusions:   PRN Meds: acetaminophen, bisacodyl, guaiFENesin-dextromethorphan, LORazepam **OR** LORazepam, Muscle Rub, ondansetron **OR** ondansetron (ZOFRAN) IV, oxyCODONE, traZODone  Physical Exam Vitals reviewed.  Cardiovascular:     Rate and Rhythm: Normal rate.  Pulmonary:     Effort: Pulmonary effort is normal.  Neurological:     Mental Status: He is alert. He is disoriented.  Psychiatric:        Mood and Affect: Mood normal.             Vital Signs: BP 118/78 (BP Location: Right Arm)   Pulse 79   Temp 97.8 F (36.6 C) (Oral)   Resp 18   Ht 5\' 4"  (1.626 m)   Wt 88.9 kg   SpO2 97%   BMI 33.64 kg/m  SpO2: SpO2: 97 % O2 Device: O2 Device: Room Air O2 Flow Rate: O2 Flow Rate  (L/min): 2 L/min   Patient Active Problem List   Diagnosis Date Noted   Acute metabolic encephalopathy 03/12/2023   HFrEF (heart failure with reduced ejection fraction) (HCC) 03/12/2023   Morbid obesity (HCC) 03/12/2023   Encephalopathy 03/12/2023   Epistaxis 03/12/2023   PAF (paroxysmal  atrial fibrillation) (HCC) 03/12/2023   Long term (current) use of insulin (HCC) 03/12/2023   ESRD on dialysis (HCC) 03/12/2023   Debility 03/12/2023   Acute encephalopathy 02/15/2023   Gait abnormality 09/11/2022   Tremor 09/11/2022   Memory loss 09/11/2022   Hypertension associated with diabetes (HCC) 06/15/2022   Heart block AV second degree 06/15/2022   Peritoneal dialysis catheter in place [facility]) 06/14/2022   End stage renal disease on dialysis (HCC) 06/14/2022   Hyperglycemia 06/12/2022   AV block 06/07/2022   S/P CABG x 2 06/05/2022   Second degree AV block, Mobitz type I 12/27/2021   Unstable angina (HCC)    NSTEMI (non-ST elevated myocardial infarction) [facility])    Rectal bleeding 07/13/2021   Allergic rhinitis 04/07/2020   Asthma 10/02/2019   Pain in left foot 12/18/2018   Chest pain 08/29/2018   Constipation 02/28/2018   ESRD (end stage renal disease) (HCC) 11/13/2017   Foul smelling urine 12/14/2016   Coronary artery disease involving native coronary artery of native heart without angina pectoris 10/27/2016   Chronic kidney disease, stage IV (severe) (HCC) 10/27/2016   Morbid obesity due to excess calories (HCC) 03/22/2016   CAD (coronary artery disease)    Erectile dysfunction 08/16/2015   Hemorrhoids 07/05/2015   Vitamin D deficiency 06/04/2015   OSA on CPAP 05/14/2015   Normocytic anemia 03/24/2015   Fatty liver 12/01/2014   Back pain 06/05/2013   OA (osteoarthritis) of knee 03/15/2011   CTS (carpal tunnel syndrome) 03/15/2011   Type 2 diabetes mellitus with chronic kidney disease on chronic dialysis, with long-term current use of insulin (HCC) 09/29/2008   Hyperlipidemia  09/29/2008   Essential hypertension 09/29/2008   GERD 09/29/2008    Palliative Care Assessment & Plan   Patient Profile: 72 y.o. male  with past medical history of  CAD, PUD, DM, OAS, HLD, ESRD who was on PD which was removed on 02/19/23, LUE AVF in place, IR was consulted for image guided tunneled HD cath placement.    Patient was admitted to [facility] on 02/15/23 after presenting to [facility] ED with acute metabolic encephalopathy assumed due to recent Covid infection, PD catheter malfunctioning, later found to have sepsis secondary to peritoneal dialysis associated catheter peritonitis and polymicrobial bacteremia/fungemia, s/p PD removal on 02/19/23. Hospital course complicated by worsening encephalopathy with hypoxemia.    Patient had LUE AVF in place, he underwent fistulogram with balloon venoplasty on 8/14 by Dr. Karin Lieu, the fistula was used for HD treatment on 8/16. Patient was discharged to [facility] on 8/16, he was admitted to CIR on 03/12/23. In CIR, patient continued to be confused and agitated, psychiatry is following. Patient has been disruptive during HD at times, per nephrology note he was too combative for HD on 04/07/23, agitation appears to be made worse with cannulation of AVF.    Assessment: Patient is sitting in his wheelchair eating breakfast with his family. His family share that he required restraints in his last dialysis session. The patient states he knows he needs to sit through dialysis but he often acknowledges this then becomes agitated in dialysis.  His wife seems more optimistic today that the team might figure out a way for him to tolerate dialysis. The plan today is for him to take his meds 30 minutes to an hour before dialysis. Patient's wife faces treatment option decisions and anticipatory care needs. She is working with social work for anticipatory care need planning.  PMT will continue to support. Encouraged her to  call with questions or  concerns.   Recommendations/Plan: DNR Limited scope of treatment- no intubation Time for outcomes-- hoping to find a way to continue dialysis Spiritual care consult- for supporting patient's wife Continued PMT support   Code Status:    Code Status Orders  (From admission, onward)           Start     Ordered   04/08/23 1629  Do not attempt resuscitation (DNR)- Limited -Do Not Intubate (DNI)  (Code Status)  Continuous       Question Answer Comment  If pulseless and not breathing No CPR or chest compressions.   In Pre-Arrest Conditions (Patient Is Breathing and Has A Pulse) Do not intubate. Provide all appropriate non-invasive medical interventions. Avoid ICU transfer unless indicated or required.   Consent: Discussion documented in EHR or advanced directives reviewed      04/08/23 1629        Extensive chart review has been completed prior to seeing the patient and speaking with his wife, daughter, and granddaughter including labs, vital signs, imagine, progress/consult notes, orders, medications, and available advance directive documents.  Time spent: 25 minutes  Thank you for allowing the Palliative Medicine Team to assist in the care of this patient.     Sherryll Burger, NP  Please contact Palliative Medicine Team phone at [phone] for questions and concerns.

## 2023-04-13 NOTE — Progress Notes (Signed)
PROGRESS NOTE   Subjective/Complaints:  Patient sitting at nurses station this morning, no new concerns elicited.    Urine has improved to clear yellow.  WUX:[id number] due to cognition/behavioral   Objective:   No results found. Recent Labs    04/11/23 1008  WBC 4.5  HGB 9.5*  HCT 31.9*  PLT 186      Recent Labs    04/11/23 1008  NA 136  K 4.2  CL 97*  CO2 25  GLUCOSE 152*  BUN 45*  CREATININE 12.26*  CALCIUM 9.1       Intake/Output Summary (Last 24 hours) at 04/13/2023 1536 Last data filed at 04/13/2023 1450 Gross per 24 hour  Intake 600 ml  Output --  Net 600 ml          Physical Exam: Vital Signs Blood pressure 110/88, pulse 79, temperature 98.6 F (37 C), resp. rate 18, height 5\' 4"  (1.626 m), weight 88.9 kg, SpO2 94%.     General: NAD HENT: conjugate gaze; oropharynx moist CV: regular rate; no JVD Pulmonary: CTA B/L; nonlabored breathing GI: soft, NT, ND, (+)BS Psychiatric: inappropriate, confused, pleasant  Ext: no clubbing, cyanosis, or edema Psych: Confused, not agitated at this time Neurologic Exam: Sitting in chair appears calm at the moment Awake and alert Reports he remembers me from prior days Oriented to self, rehabilitation center + Memory deficit - ongoing  Sensory exam: Intact to touch in all 4 extremities Motor exam: moves all 4 extremities without limitiations Coordination: Slight bilateral upper extremity tremor, intention, mild.    Musculoskeletal: No joint swelling or tenderness noted No abnormal tone      Assessment/Plan: 1. Functional deficits which require 15 hr per week of interdisciplinary therapy in a comprehensive inpatient rehab setting. Physiatrist is providing close team supervision and 24 hour management of active medical problems listed below. Physiatrist and rehab team continue to assess barriers to discharge/monitor patient progress toward  functional and medical goals  Care Tool:  Bathing    Body parts bathed by patient: Right arm, Left arm, Chest, Abdomen, Front perineal area, Right upper leg, Buttocks, Left upper leg, Right lower leg, Left lower leg, Face         Bathing assist Assist Level: Moderate Assistance - Patient 50 - 74%     Upper Body Dressing/Undressing Upper body dressing   What is the patient wearing?: Pull over shirt    Upper body assist Assist Level: Minimal Assistance - Patient > 75%    Lower Body Dressing/Undressing      Lower body dressing      What is the patient wearing?: Underwear/pull up, Pants     Lower body assist Assist for lower body dressing: Maximal Assistance - Patient 25 - 49%     Toileting Toileting    Toileting assist Assist for toileting: Maximal Assistance - Patient 25 - 49%     Transfers Chair/bed transfer  Transfers assist     Chair/bed transfer assist level: Minimal Assistance - Patient > 75%     Locomotion Ambulation   Ambulation assist      Assist level: Minimal Assistance - Patient > 75% Assistive device: No Device Max distance:  175'   Walk 10 feet activity   Assist     Assist level: Contact Guard/Touching assist Assistive device: No Device   Walk 50 feet activity   Assist Walk 50 feet with 2 turns activity did not occur: Safety/medical concerns  Assist level: Minimal Assistance - Patient > 75% Assistive device: No Device    Walk 150 feet activity   Assist Walk 150 feet activity did not occur: Safety/medical concerns  Assist level: Minimal Assistance - Patient > 75% Assistive device: No Device    Walk 10 feet on uneven surface  activity   Assist Walk 10 feet on uneven surfaces activity did not occur: Safety/medical concerns         Wheelchair     Assist Is the patient using a wheelchair?: Yes Type of Wheelchair: Manual    Wheelchair assist level: Dependent - Patient 0%      Wheelchair 50 feet with 2  turns activity    Assist        Assist Level: Dependent - Patient 0%   Wheelchair 150 feet activity     Assist      Assist Level: Dependent - Patient 0%   Blood pressure 110/88, pulse 79, temperature 98.6 F (37 C), resp. rate 18, height 5\' 4"  (1.626 m), weight 88.9 kg, SpO2 94%.  Medical Problem List and Plan: 1. Functional deficits secondary to metabolic encephalopathy due to sepsis secondary to catheter peritonitis and hypoxemia              -patient may not shower             -ELOS/Goals: 16-18 days, PT/OT Sup, SLP min A -tentative discharge date 9-9           -Continue CIR therapies including PT, OT, and SLP   -Will review records from [facility] -- has worsened since approximately 8/22, possible 8/19, consistent with records from nursing at [facility] with current behavior.   -Therapy QD schedule  -Therapy suggest having patient set up for short time before his therapy sessions to help him wake up.  I think this would be a good idea  Con't CIR PT, OT and SLP  Patient was seen by palliative care, changed to DNR status  -SNF vs Home discharge   2.  Antithrombotics: -DVT/anticoagulation:  Pharmaceutical: Heparin, 03/23/23 changed to Lovenox yesterday to help improved compliance             -antiplatelet therapy: aspirin and Plavix   3. Pain Management: Tylenol as needed             -Lidoderm patches to back  -Elavil 25 mg at bedtime for nerve pain and sleep 8/27  -8/29: Pain well controlled on Elavil; DC lyrica, PRN Tylenol/oxy 2.5-5 mg d/t CKD  8-30: Appears patient has been getting pain medications together overnight along with antipsychotics, will discuss with nursing  8-31: Patient having an difficulty endorsing needs and not utilizing PRNs, scheduled oxycodone 5 mg 3 times daily and has 2.5 mg prn 3 times daily available.   9-1: Not utilizing PRNs, minimal complaints of pain, will discontinue scheduled given lethargy today..  -muscle rub R shoulder  9/16 patient  reports pain is under control overall   9/26 no report of pain noted 4. Mood/Behavior/Sleep: LCSW to evaluate and provide emotional support             -continue Seroquel 50 mg TID             -clonazepam  0.5 mg TID prn             -Geodon 10 mg IM q 12 hours prn             -antipsychotic agents: n/a  -8-27: Agitated by soft restraints in bed, transition to 4 side rails, Posey alarm, and TeleSitter for now.  Veil bed on standby if needed, however feel we should avoid this if possible due to likelihood of agitating patient further   - 8/28: Placed in veil bed; wrist restraints for dialysis. Scheduled Geodon 20 mg twice daily; discontinue Seroquel and clonazepam due to no perceivable benefit. Add as needed Zyprexa 5 mg twice daily p.o. or IM due to recent cardiac arrhythmias and concern for QTc prolongation. As needed ramelteon for sleep. Stop gabapentin due to possible side effect of agitation  - 8/28: No improvement except temporarily with Geodon; add depakote 125 mg q8h for ongoing aggitation, mood stabilization. Increase elavil to 50 mg at bedtime.  Workup ongoing.    - 8/29: EEG negative, labs normal. MRI pending. Sleeping well and calm since starting depakote and increase in elavil. Will monitor tonight and wean Geodon in AM if appropriate.   8-30: MRI showing chronic microvascular changes, otherwise normal.  Increasingly calm and appropriate during the day, with ongoing agitation at nighttime.  Will increase nighttime Geodon to 40 mg; will discuss sleep log with nursing, as it has not been performed.  8-31 no sleep overnight per sleep log, again getting agitated around 9 PM and 5 AM.  Not responsive to as needed Zyprexa, responsive to Ativan.  Attempted to wean off scheduled a.m. Geodon today, but patient became extremely agitated and unable to redirect, calm down with readministration of medication.  Resume Geodon 20 mg every morning/40 mg every afternoon, increased Elavil to 75 mg nightly, adding  trazodone 50 mg nightly as needed.  P.o./IV Ativan 0.5 mg every 6 hours for agitation.  9-1: slept well overnight with trazodone and increased Elavil, no PRNs used.  Unfortunately, oversedated today.  Discontinue scheduled pain medications as above, start wean of Elavil to 50 mg x2 days -> 25 mg x2 days -> 10 mg x2 days, off due to no apparent benefit and no complaints of pain.  Reduce Geodon from 20 mg every morning/40 mg every evening to 20 mg twice daily.  Continue trazodone as needed for sleep, as this seems to be the difference for a restful night yesterday.  If he remains overly sedated, would dose reduce and DC Depakote next.  Keep Ativan as needed d/t dialysis not having other options available.  9/2-3--non agitated but still sleepy   -dc'ed depakote as I doubt it's doing much at this dose anyway   -continue geodon at 20mg  bid   -scheduled trazodone at HS with back up dose if needed    -sleep was intermittent last night   -requires enclosure bed for safety  9/4 continue enclosure bed for now, consider trying DC of this device early next week  -9/5 appears to be doing better overall this morning, continues to have intermittent agitation at times.  Continue enclosure bed  -9/6 agitation appears to be improving, patient is skeptical about medications and has been declining some of these.  Will discontinue multivitamin, RisaQuad to try to decrease pill burden  -9/7 continues to have intermittent episodes of agitation however overall improving, appears to be taking most of his medications  9/9 agitation and mental status appears to be improving -discussing discontinuation of enclosure  bed with team 9/10 Geodon dose increased to 40 mg at night, continue 20 mg in the morning 9/11 No acute events noted, will check with team regarding DC enclosure bed, nursing reports he did ok last night but had dialysis 9/12 continues to have sundowning/waxing waning confusion and agitation, continue enclosure bed  for now, will caution with Geodon will continue current dose but hold off on any escalation as this can contribute to bradycardia 9/16 no longer using enclosure bed, continue Geodon 20 mg twice daily for now due to intermittent agitation 9/17 patient with agitation and confusion at night, has sedation during the day.  Discussed with psychiatry Dr. Gasper Sells.  Will discontinue Geodon and try Abilify tonight.  Dr. Gasper Sells also suggested considering donepezil to shorten hallucinations of delirium.  Psychiatry consult placed, will see tomorrow.  Appreciate assistance 9/18 no behavioral related notes overnight, psychiatry plans to see him today.  Appreciate assistance 9/19 patient was seen by psychiatry yesterday, recommended continue Abilify, consider donepezil or memantine, suggested avoiding stimulants.  Will continue current regimen for now as it appears he is doing better.  He appears to be less sedated and did not see frequent notes of agitation but this regimen thus far. 9/20 recheck CMP/ammonia/VPA level today due to continued drowsiness  9/21- Depakote level 16- too low- Ammonia 19- low- asleep this AM, but haven't been called today about sedation/lethargy.  9/22- more hallucinations per nursing- labs attempted- couldn't get- couldn't get U/A results since so much hematuria. Trying to get labs, to see if getting ill vs missing HD? 9/23 will go back to 3 times daily for Depakote to help avoid agitation 9/24 continue current regimen with Abilify and start memantine 5 mg daily 9/27 increase Abilify to 7.5 mg daily to help reduce agitation 5. Neuropsych/cognition: This patient is not capable of making decisions on his own behalf   -recommend neuropsych consult   - 8/27: Ongoing aggitation; Consult hospitalist for further workup on potential contributors to encephalopathy. Pending UA, LA 2.1-> 1.4; otherwise negative so far -Delirium precautions, sleep log, Veiled bed for safety and to minimize  restraints -Wife has expressed that she is not interested in palliative care at this time; hopefully with above measures patient can get dialysis in the next 48 hours to limit worsening uremic encephalopathy and medical decline    - 8/29: Aggitation much improved, able to get some dialysis last night and today. See above for medication changes. Calling wife tonight to discuss.   8-30: Still with moderate cognitive deficits, however appears once patient is less agitated he is better able to concentrate and has better orientation.  MRI negative for acute findings, monitor.  9/3 agitation better. More sedated than anything at this point. Confused as well.    -depakote stopped   -continue other meds as rx'ed  9/21- pt refused Depakote this AM "since doesn't have seizures".   9/22- per nursing, attempted 3 different times yesterday and today to give Depakote 6. Skin/Wound Care: Routine skin care checks             -sacral skin off-loading/protection   7. Fluids/Electrolytes/Nutrition: Strict Is and Os and follow-up chemistries per nephrology             -renal diet/ 1200 cc FR  - Renvela 800 mg 3 times daily for hyperphosphatemia added per nephrology 8-27   9/21- Last Cr 7.07 and BUN 22 8: Hypotension: monitor TID and prn             -  continue midodrine as needed during dialysis  Normotensive, monitor  9-1: Consistently 90s over 60s, has been stable but given findings of microvascular changes on MRI, may be too low for patient to perfuse properly.  Will add midodrine 5 mg tid and monitor.  DC Coreg as heart rate has been low to normal; may resume at low-dose once blood pressure improved.   9/2 continue with ccurrent regimen. Bp's hr remain low   -elavil wean should help  9/9-10 BP stable, continue midodrine and monitor  9/13 DC midodrine today as BP has been higher, can restart if needed  Improved, cont to monitor  9/27 BP stable overall    04/13/2023    3:05 PM 04/13/2023    6:28 AM 04/12/2023     7:35 PM  Vitals with BMI  Systolic 110 118 413  Diastolic 88 78 93  Pulse  79 94      9: Hyperlipidemia: continue statin   10: CAD s/p CABG 2023: on DAPT and statin.  - troponin elevated but stable, monitor 8/27   11: DM-2: A1c = 6.3% (at home on Lantus 22 units at bedtime, Tradjenta 5 mg daily)        -monitor PO intake             -continue SSI (0-6 units) does not appear to be requiring coverage   - 9/6 DC SSI, change CBGs to daily  9/8 discontinue CBGs  9/16 glucose still controlled on BMP yesterday   9/23 glucose 100 on BMP yesterday  12: ESRD: HD on Tues, Thurs, Sat -nephrology made aware   - Unable to tolerate 8/27 d/t aggitation, stopped early 8/28;  - attempting again 8/29, stopped early again -Per nephrology, redirectable with gum -Have to switch restraints during dialysis sessions from veil bed to regular bed, then resume veil bed order once at rehab -9/9 tolerating HD better 9/18 patient's wife indicates she would be able to stay with him during dialysis after discharge   9/22- pt refused/too agitated ot get HD yesterday  9/24 nephrology does not feel like he is a candidate for outpatient dialysis at this time due to agitation  9/27 will attempt hemodialysis in the mornings, premedicated to help reduce risk of agitation   13: COPD/OSA; chronic home 2L O2 (Symbicort, Ventolin, prednisone 40 mg daily at home)             -continue symbicort  -Added home oxygen back 8-27   14: GERD: continue Protonix   15: Non-sustained VT/pAF/bradycardia: per cardiology, Dr. Algie Coffer: -continue carvedilol 6.25 mg BID; consider increasing to 12.5 mg BID before starting low dose amiodarone  -consider Eliquis and discontinue asa and Plavix in one week if no active GI bleeding and stable hemoglobin from 8/24 -hemoglobin has remained stable but very low around 7, no recurrent A-fib, monitor for now given agitation/fall 8-31 and consider initiating Eliquis when safe -Continue mag-ox  to avoid hypomagnesemia -8-27: Single instance of bradycardia this morning 40s, may have been missed read, monitor through today and do not further adjust beta-blocker medications  -9-3 hr controlled after holding coreg -9/4-5 HR stable, continue to monitor -9/9 consider Eliquis this week if agitation continues to improve -9/10 will hold off on Eliquis due to sundowning at night, will see how he does with increase Geodon -9/11 EKG for bradycardia episode today, 2nd degree A-V block mobitz type 1.  Continue to hold beta blocker,9/11 will restart eliquis at 2.5 mg BID dose. Will consult cardiology regarding need  for ASA/plavix and coreg.  Dr. Elease Hashimoto does not appear convinced that he has A-fib 9/12 appreciate cardiology consult yesterday, DAPT transition to daily aspirin 81 only and Eliquis was discontinued, caution with Geodon as this can contribute to bradycardia 9/13 will decrease Geodon nighttime dose to 20 due to episodes of bradycardia 9/17 will discontinue Geodon due to variable cardiac effects 9/24 bradycardia continues to be improved after Geodon discontinued     04/13/2023    3:05 PM 04/13/2023    6:28 AM 04/12/2023    7:35 PM  Vitals with BMI  Systolic 110 118 604  Diastolic 88 78 93  Pulse  79 94       16: HFrEF: Daily weights. Monitor for signs of overload  - 9/7 continue daily weights, fluid level management per hemodialysis team Weight reviewed and stable  9/21- 94.3- down 3 kg-   9/22- no weight for today- too agitated per nursing Filed Weights   04/11/23 1152 04/11/23 1628 04/12/23 0700  Weight: 90.4 kg 88.4 kg 88.9 kg      17: Liver nodularity on CT scan: LFTs reported to be normal. Avoid hepatotoxic medications    -Abdominal ultrasound earlier this admission with minimal ascites; ammonia within normal limits  -8/31: Did discuss with nephrology whether p.o. lactulose would be beneficial despite normal ammonia, after reviewing records unsure that this would be  beneficial    18: Epistaxis - resolved             -previously improved with flonase and saline spray   19: Morbid Obesity. Dietary counseling Body mass index is 39.01 kg/m.  20. Bradycardic: continue to monitor HR TID  -Discontinue Geodon  -9/19 heart rate in the 80s, stable  HR stable  21. HTN: resolved after HD, continue to monitor TID  9/19-20 intermittently elevated, continue to monitor.  Cautious with medication addition to avoid hypotension  9/27 BP controlled   22. Hematuria  9/22- required in/out to check U/A- having new hematuria- nursing to keep an eye- since also bleeding from penis a little- monitor   9/23 no wBCs  on UA, follow  urine culture-Staphylococcus hemolyticus-discussed with pharmacy and ID pharmacy.  Think this is more likely contaminant  9/25 discuss with nephrology about possible treatment or other evaluation options  Spoke with Dr. Ronalee Belts, most likely contaminant.  Recommended treating only if symptomatic.  If hematuria continues consider urology input  9/27 urine has improved to clear yellow  LOS: 32 days A FACE TO FACE EVALUATION WAS PERFORMED  Fanny Dance 04/13/2023, 3:36 PM

## 2023-04-13 NOTE — Progress Notes (Signed)
Physical Therapy Weekly Progress Note  Patient Details  Name: [name] [name] MRN: [id number] Date of Birth: [birth year]  Beginning of progress report period: April 06, 2023 End of progress report period: April 13, 2023  Today's Date: 04/13/2023 PT Individual Time: 807-827  PT Individual Time Calculation: 20 min  Pt continues to work towards his long term goals. Pt continues to demonstrate inconsistency with presentation secondary to lethargy and agitation. Over the past week pt has been as little as min assist for transfers and ambulation up to 1' with WC follow however can require up to max assist if pt demonstrating poor initiating, motor planning and command following. Continue plan of care.  Patient continues to demonstrate the following deficits muscle weakness, decreased cardiorespiratoy endurance, impaired timing and sequencing, abnormal tone, unbalanced muscle activation, motor apraxia, and decreased motor planning,  , decreased initiation, decreased attention, decreased awareness, decreased problem solving, decreased safety awareness, decreased memory, and delayed processing, and decreased standing balance, decreased postural control, and decreased balance strategies and therefore will continue to benefit from skilled PT intervention to increase functional independence with mobility.  Patient progressing toward long term goals..  Continue plan of care.  PT Short Term Goals Week 4:  PT Short Term Goal 1 (Week 4): Pt will complete transfers min assist consistently. PT Short Term Goal 1 - Progress (Week 4): Progressing toward goal PT Short Term Goal 2 (Week 4): Pt will complete gait 50' with min assist consistently PT Short Term Goal 2 - Progress (Week 4): Progressing toward goal PT Short Term Goal 3 (Week 4): Pt will complete up/down 1 step with min assist PT Short Term Goal 3 - Progress (Week 4): Progressing toward goal Week 5: PT Short Term Goal 1 (Week 5): To continue to  consistently meet LTG.  Skilled Therapeutic Interventions/Progress Updates:  Pt presents in room in bed, yelling and agitated about having trash in bed, able to be calmed with redirection and encouragement to trial getting out of bed. Pt does report pain in R side during session, RN notified. Session focused on transfer training and therapeutic activity to facilitate participate with self care tasks.  Pt requesting to don socks and pants, perseverative at start of session however improves with gentle redirection. Pt dons socks and pants with max assist. Pt requires min assist for bed mobility for safety placing BLEs on floor due to pt with increased proximity to EOB. Pt requires increased time to adjust to upright position, then completes stand with min assist and verbal cues to pull to pants over hips. Pt completes stand pivot transfer with min assist and max verbal cues for sequencing with therapist placing RUE on WC to facilitate attendance to task.  Pt transported via WC to therapy gym dependently for time management. Pt prompted to complete gait training/exercises however pt declines due to pain in side. Therapist communicates with RN about pt pain, pt positioned in [facility] behind nurse's station with chair alarm donned and activated at end of session.  Ambulation/gait training;Community reintegration;DME/adaptive equipment instruction;Psychosocial support;Stair training;Neuromuscular re-education;Wheelchair propulsion/positioning;UE/LE Strength taining/ROM;Balance/vestibular training;Discharge planning;Pain management;Therapeutic Activities;UE/LE Coordination activities;Cognitive remediation/compensation;Functional mobility training;Disease management/prevention;Patient/family education;Therapeutic Exercise;Visual/perceptual remediation/compensation   Therapy Documentation Precautions:  Precautions Precautions: Fall Restrictions Weight Bearing Restrictions: No  Therapy/Group: Individual  Therapy  Edwin Cap PT, DPT 04/13/2023, 1:01 PM

## 2023-04-13 NOTE — Plan of Care (Signed)
Pt confused to situation and time. Pt agitated and irritable in morning, calling out for nurse repeatedly after nurse already in room to fix pt up for breakfast. Pt seen by physical therapy, pt assisted into chair, After PT pt brought to nurses station to help keep pt calm. RN gave pt PRN ativan and his PRN pain medication per pt request. Chair belt alarm in place.

## 2023-04-13 NOTE — Progress Notes (Signed)
Subjective:  seen in room sitting up in wheelchair , family in room. Pt slightly sedate  butr has no cos. For hd today / Verified with HD staff  NO FAMILY  Allowed IN HD RM  2/2 HippA  REG.   Objective Vital signs in last 24 hours: Vitals:   04/12/23 0700 04/12/23 1517 04/12/23 1935 04/13/23 0628  BP:  113/73 (!) 163/93 118/78  Pulse:  72 94 79  Resp:  19 18 18   Temp:  98.4 F (36.9 C) 98.4 F (36.9 C) 97.8 F (36.6 C)  TempSrc:  Oral  Oral  SpO2:  95% 98% 97%  Weight: 88.9 kg     Height:       Weight change:   Physical Exam: General: sedate  elderly male  NAD Heart: RRR , No MRG Lungs: CTA , nonlabored  Abdomen: NABS , Soft , Nt, ND Extremities: TRace bipedal edema Dialysis Access: L Arm AVf + bruit     OP dialysis Orders: Previously on CCPD thru [facility] - changed to HD - will need outpatient HD unit established prior to d/c. 4hr, 3K, EDW ~96, L AVF   Assessment/Plan: 1. Debility - in CIR 2. Encephalopathy/agitation: Waxing/waning agitation, dementia sundowning. No acute findings on imaging. He continues to be disruptive during HD. Needs sitter. But sitters are not allowed in the hospital HD unit. Have d/w HD staff - they state that most if not all of his recent HD sessions have been problematic due to resistance and aggressive behavior. We have been using IV ativan to assist w/ getting him through dialysis. Use of IV Ativan can not be done in OP HD units. VIR  will not place catheter, agitation is not an indication to place cathter.  Rehab team adjusting meds.  3. ESRD: Transitioned from PD to HD recently on TTS schedule. Using old AVF which is still functional. Has been off schedule for HD this week d/t behavior. Had partial HD Mon, then HD Wed. Plan was to see if he would do better with dialysis in the mornings and to attempt HD Fri morning. Notified that he has  a scheduled phone call with incarcerated family member today am=so won't be available for treatment. If  labs are ok should be able to resume treatment on Saturday am. Family would prefer Saturday HD. Coordinate with rehab team so they know to premedicate before he comes to [facility].  4.Hypertension/volume: BP higher, midodrine 5mg  stopped. UF as tolerated.  5. Anemia: Hgb 9s --on Aranesp weekly. Last Tsat 38% with ferritin 2175.  No iron.  6. Metabolic bone disease: CorrCa ok, Phos acceptable. No VDRA. 7.  Nutrition:  Alb very low, continue supps.  Renal diet w/fluid restrictions.  8.  Hx NSVT: On Coreg 10.  Recent culture negative PD cath-related peritonitis and associated fungemia + enterococcus bacteremia - underwent PD cath removal, TDC placement and removal, temp HD cath placement  and removal, and line holiday. Completed IV unasyn, IV micafungin and cipro on 8/15. TDC not replaced cause he had functional AVF.   11. Hematuria - dirty UA. Coag neg staph on Ucx - defer to primary team  12. GOC - Seen by palliative care - Now DNR/DNI wants to continue dialysis.  13. Dispo: For SNF placement. Regarding outpatient dialysis placement- per renal navigator he will need new referral since he has been gone from his prior clinic so long. Family members all willing to sit with him during treatments.  At this time he  is not appropriate for outpatient dialysis d/t ongoing agitation and requiring restraints and multiple staff to reorient during treatments.    [name] Pastel, PA-C [facility] Beeper [phone] 04/13/2023,1:22 PM  LOS: 32 days   Labs: Basic Metabolic Panel: Recent Labs  Lab 04/08/23 1517 04/09/23 1219 04/11/23 1008  NA 139 138 136  K 3.9 4.1 4.2  CL 96* 99 97*  CO2 26 26 25   GLUCOSE 105* 100* 152*  BUN 44* 51* 45*  CREATININE 10.93* 12.78* 12.26*  CALCIUM 9.0 9.0 9.1  PHOS 4.5 4.7* 4.7*   Liver Function Tests: Recent Labs  Lab 04/06/23 1330 04/08/23 1517 04/09/23 1219 04/11/23 1008  AST 43*  --   --   --   ALT 34  --   --   --   ALKPHOS 86  --   --   --    BILITOT 1.0  --   --   --   PROT 7.0  --   --   --   ALBUMIN 2.6* 2.5* 2.5* 2.4*   No results for input(s): "LIPASE", "AMYLASE" in the last 168 hours. Recent Labs  Lab 04/06/23 1330  AMMONIA 19   CBC: Recent Labs  Lab 04/08/23 1517 04/09/23 1219 04/11/23 1008  WBC 4.4 3.9* 4.5  NEUTROABS 2.9  --   --   HGB 9.4* 9.6* 9.5*  HCT 31.5* 32.3* 31.9*  MCV 91.8 91.8 94.4  PLT 169 185 186   Cardiac Enzymes: No results for input(s): "CKTOTAL", "CKMB", "CKMBINDEX", "TROPONINI" in the last 168 hours. CBG: No results for input(s): "GLUCAP" in the last 168 hours.  Studies/Results: No results found. Medications:   (feeding supplement) PROSource Plus  30 mL Oral BID BM   ARIPiprazole  7.5 mg Oral QHS   aspirin EC  81 mg Oral Daily   atorvastatin  80 mg Oral Daily   Chlorhexidine Gluconate Cloth  6 each Topical Q0600   darbepoetin (ARANESP) injection - DIALYSIS  150 mcg Subcutaneous Q Sun-1800   divalproex  125 mg Oral Q8H   feeding supplement  237 mL Oral BID WC   Gerhardt's butt cream  1 Application Topical QID   lidocaine  1 patch Transdermal Q2200   lidocaine  1 patch Transdermal Q24H   memantine  5 mg Oral Daily   mometasone-formoterol  2 puff Inhalation BID   pantoprazole  40 mg Oral BID   traZODone  50 mg Oral QHS

## 2023-04-13 NOTE — Progress Notes (Signed)
Occupational Therapy Weekly Progress Note  Patient Details  Name: [name] [name] MRN: [id number] Date of Birth: [birth year]  Beginning of progress report period: March 13, 2023 End of progress report period: April 13, 2023  Today's Date: 04/13/2023 OT Individual Time: 0933-1000 OT Individual Time Calculation (min): 27 min    Patient continues to work towards his long term goals of min/mod A for BADL tasks. Pt continues to be inconsistent with participation pending behaviors of agitation and lethargy. Pt has seemed to have a few more better days in a row, but will then have more buts of confusion and agitation. Continue current POC for now.   Patient continues to demonstrate the following deficits: muscle weakness, decreased cardiorespiratoy endurance, decreased initiation, decreased attention, decreased awareness, decreased problem solving, decreased safety awareness, decreased memory, and delayed processing, and decreased sitting balance, decreased standing balance, decreased postural control, and decreased balance strategies and therefore will continue to benefit from skilled OT intervention to enhance overall performance with BADL, iADL, and Reduce care partner burden.  Patient progressing toward long term goals..  Continue plan of care.  OT Short Term Goals Week 3:  OT Short Term Goal 1 (Week 3): LTG=STG 2/2 ELOS Week 4:  OT Short Term Goal 1 (Week 4): To continue to consistently meet LTG.  Skilled Therapeutic Interventions/Progress Updates:    Pt greeted in the bathroom with nurse tech. Pt standing facing the wall and had already started urinating as unable to get pt to turn to the commode safely. OT placed urinal to try to catch the rest of urine, but he did not go more. Pt needed multimodal cues to orient out of the bathroom with min HHA. Breakfast tray delivered and worked on B hand coordination to cut pancakes with min A as well as initiation within self-feeding task. Pt alert  and mostly cooperative during session. Pt returned to nurses station and left seated at nurses station with alarm on, call bell in reach, and needs met.    Therapy Documentation Precautions:  Precautions Precautions: Fall Restrictions Weight Bearing Restrictions: No Pain: No pain reported   Therapy/Group: Individual Therapy  [name] 04/13/2023, 12:25 PM

## 2023-04-14 LAB — CBC
HCT: 35.1 % — ABNORMAL LOW (ref 39.0–52.0)
Hemoglobin: 10.3 g/dL — ABNORMAL LOW (ref 13.0–17.0)
MCH: 26.7 pg (ref 26.0–34.0)
MCHC: 29.3 g/dL — ABNORMAL LOW (ref 30.0–36.0)
MCV: 90.9 fL (ref 80.0–100.0)
Platelets: 242 10*3/uL (ref 150–400)
RBC: 3.86 MIL/uL — ABNORMAL LOW (ref 4.22–5.81)
RDW: 15.7 % — ABNORMAL HIGH (ref 11.5–15.5)
WBC: 4.3 10*3/uL (ref 4.0–10.5)
nRBC: 0 % (ref 0.0–0.2)

## 2023-04-14 LAB — RENAL FUNCTION PANEL
Albumin: 2.6 g/dL — ABNORMAL LOW (ref 3.5–5.0)
Anion gap: 16 — ABNORMAL HIGH (ref 5–15)
BUN: 50 mg/dL — ABNORMAL HIGH (ref 8–23)
CO2: 23 mmol/L (ref 22–32)
Calcium: 9.3 mg/dL (ref 8.9–10.3)
Chloride: 94 mmol/L — ABNORMAL LOW (ref 98–111)
Creatinine, Ser: 12.62 mg/dL — ABNORMAL HIGH (ref 0.61–1.24)
GFR, Estimated: 4 mL/min — ABNORMAL LOW (ref >60)
Glucose, Bld: 154 mg/dL — ABNORMAL HIGH (ref 70–99)
Phosphorus: 4.6 mg/dL (ref 2.5–4.6)
Potassium: 4.7 mmol/L (ref 3.5–5.1)
Sodium: 133 mmol/L — ABNORMAL LOW (ref 135–145)

## 2023-04-14 MED ORDER — LORAZEPAM 2 MG/ML IJ SOLN
0.5000 mg | Freq: Once | INTRAMUSCULAR | Status: AC
  Administered 2023-04-14: 0.5 mg via INTRAVENOUS
  Filled 2023-04-14: qty 1

## 2023-04-14 NOTE — Progress Notes (Addendum)
Spoke with Albert,RN (charge nurse) to notify him this patient would be available anytime today (morning) for dialysis and the continued plan is to ensure he is premedicated before arriving to dialysis today. Patient does not have therapy today, typically patient's goes to dialysis after therapy per Unit protocol. RN aware of IP Rehab therapy routine. Depakote and chart sent to nurse.    Tilden Dome, LPN

## 2023-04-14 NOTE — Progress Notes (Signed)
HD Progress Note:   04/14/23 1551  Vitals  Temp 98.2 F (36.8 C)  Temp Source Oral  BP (!) 137/103  MAP (mmHg) 116  BP Location Right Arm  BP Method Automatic  Patient Position (if appropriate) Lying  Pulse Rate Source Monitor  ECG Heart Rate 71  Oxygen Therapy  SpO2 94 %  O2 Device Room Air  During Treatment Monitoring  Duration of HD Treatment -hour(s) 3 hour(s)  Cumulative Fluid Removed (mL) per Treatment  1500  HD Safety Checks Performed Yes  Intra-Hemodialysis Comments Tx completed;See progress note  Dialysis Fluid Bolus Normal Saline  Bolus Amount (mL) 300 mL  Post Treatment  Dialyzer Clearance Lightly streaked  Hemodialysis Intake (mL) 0 mL  Liters Processed 53.7  Fluid Removed (mL) 1500 mL  Tolerated HD Treatment Yes  Post-Hemodialysis Comments very confused and agitated, already unable to re-direct.  AVG/AVF Arterial Site Held (minutes) 10 minutes  AVG/AVF Venous Site Held (minutes) 10 minutes  Note  Patient Observations Patient can't stay still even with Once dose of IV Lorazepam. Completed 3 hours.  Fistula / Graft Left Other (Comment) Arteriovenous fistula  Placement Date/Time: 08/09/16 1327   Placed prior to admission: No  Orientation: Left  Access Location: (c) Other (Comment)  Access Type: Arteriovenous fistula  Site Condition No complications  Fistula / Graft Assessment Present;Thrill;Bruit  Status Deaccessed  Drainage Description None

## 2023-04-14 NOTE — Plan of Care (Signed)
  Problem: Consults Goal: RH GENERAL PATIENT EDUCATION Description: See Patient Education module for education specifics. Outcome: Progressing   Problem: RH BOWEL ELIMINATION Goal: RH STG MANAGE BOWEL WITH ASSISTANCE Description: STG Manage Bowel with mod I Assistance. Outcome: Progressing Goal: RH STG MANAGE BOWEL W/MEDICATION W/ASSISTANCE Description: STG Manage Bowel with Medication with mod I Assistance. Outcome: Progressing   Problem: RH SAFETY Goal: RH STG ADHERE TO SAFETY PRECAUTIONS W/ASSISTANCE/DEVICE Description: STG Adhere to Safety Precautions With cues Assistance/Device. Outcome: Progressing   Problem: RH PAIN MANAGEMENT Goal: RH STG PAIN MANAGED AT OR BELOW PT'S PAIN GOAL Description: < 4 with prns Outcome: Progressing   Problem: RH KNOWLEDGE DEFICIT GENERAL Goal: RH STG INCREASE KNOWLEDGE OF SELF CARE AFTER HOSPITALIZATION Description: Patient and wife will be able to manage care at discharge with educational resources independently Outcome: Progressing   Problem: Safety: Goal: Non-violent Restraint(s) Outcome: Progressing   Problem: Education: Goal: Ability to describe self-care measures that may prevent or decrease complications (Diabetes Survival Skills Education) will improve Outcome: Progressing Goal: Individualized Educational Video(s) Outcome: Progressing   Problem: Coping: Goal: Ability to adjust to condition or change in health will improve Outcome: Progressing   Problem: Fluid Volume: Goal: Ability to maintain a balanced intake and output will improve Outcome: Progressing   Problem: Health Behavior/Discharge Planning: Goal: Ability to identify and utilize available resources and services will improve Outcome: Progressing Goal: Ability to manage health-related needs will improve Outcome: Progressing   Problem: Metabolic: Goal: Ability to maintain appropriate glucose levels will improve Outcome: Progressing   Problem: Nutritional: Goal:  Maintenance of adequate nutrition will improve Outcome: Progressing Goal: Progress toward achieving an optimal weight will improve Outcome: Progressing   Problem: Skin Integrity: Goal: Risk for impaired skin integrity will decrease Outcome: Progressing   Problem: Tissue Perfusion: Goal: Adequacy of tissue perfusion will improve Outcome: Progressing   Problem: Education: Goal: Knowledge of General Education information will improve Description: Including pain rating scale, medication(s)/side effects and non-pharmacologic comfort measures Outcome: Progressing   Problem: Health Behavior/Discharge Planning: Goal: Ability to manage health-related needs will improve Outcome: Progressing   Problem: Skin Integrity: Goal: Risk for impaired skin integrity will decrease Outcome: Progressing   Problem: Nutritional: Goal: Maintenance of adequate nutrition will improve Outcome: Progressing

## 2023-04-15 DIAGNOSIS — G9341 Metabolic encephalopathy: Secondary | ICD-10-CM | POA: Diagnosis not present

## 2023-04-15 NOTE — Progress Notes (Signed)
PROGRESS NOTE   Subjective/Complaints:  Patient sitting at nurses station this morning,appears calm and friendly  no new concerns elicited.  Has intermittent yelling when unattended WUJ:[id number] due to cognition/behavioral   Objective:   No results found. Recent Labs    04/14/23 1152  WBC 4.3  HGB 10.3*  HCT 35.1*  PLT 242      Recent Labs    04/14/23 1152  NA 133*  K 4.7  CL 94*  CO2 23  GLUCOSE 154*  BUN 50*  CREATININE 12.62*  CALCIUM 9.3       Intake/Output Summary (Last 24 hours) at 04/15/2023 1155 Last data filed at 04/15/2023 0800 Gross per 24 hour  Intake 240 ml  Output 1500 ml  Net -1260 ml          Physical Exam: Vital Signs Blood pressure (!) 153/67, pulse 61, temperature 98.1 F (36.7 C), resp. rate 18, height 5\' 4"  (1.626 m), weight 90.6 kg, SpO2 97%.   General: No acute distress Mood and affect are appropriate Heart: Regular rate and rhythm no rubs murmurs or extra sounds Lungs: Clear to auscultation, breathing unlabored, no rales or wheezes Abdomen: Positive bowel sounds, soft nontender to palpation, nondistended Extremities: No clubbing, cyanosis, or edema Skin: No evidence of breakdown, no evidence of rash Neurologic: Cranial nerves II through XII intact, motor strength is 4/5 in bilateral deltoid, bicep, tricep, grip, hip flexor, knee extensors, ankle dorsiflexor and plantar flexor Sensory exam normal sensation to light touch and proprioception in bilateral upper and lower extremities Cerebellar exam normal finger to nose to finger as well as heel to shin in bilateral upper and lower extremities Musculoskeletal: no pain with range of motion in all 4 extremities. No joint swelling       Assessment/Plan: 1. Functional deficits which require 15 hr per week of interdisciplinary therapy in a comprehensive inpatient rehab setting. Physiatrist is providing close team supervision  and 24 hour management of active medical problems listed below. Physiatrist and rehab team continue to assess barriers to discharge/monitor patient progress toward functional and medical goals  Care Tool:  Bathing    Body parts bathed by patient: Right arm, Left arm, Chest, Abdomen, Front perineal area, Right upper leg, Buttocks, Left upper leg, Right lower leg, Left lower leg, Face         Bathing assist Assist Level: Moderate Assistance - Patient 50 - 74%     Upper Body Dressing/Undressing Upper body dressing   What is the patient wearing?: Pull over shirt    Upper body assist Assist Level: Minimal Assistance - Patient > 75%    Lower Body Dressing/Undressing      Lower body dressing      What is the patient wearing?: Underwear/pull up, Pants     Lower body assist Assist for lower body dressing: Maximal Assistance - Patient 25 - 49%     Toileting Toileting    Toileting assist Assist for toileting: Maximal Assistance - Patient 25 - 49%     Transfers Chair/bed transfer  Transfers assist     Chair/bed transfer assist level: Minimal Assistance - Patient > 75%     Locomotion Ambulation  Ambulation assist      Assist level: Minimal Assistance - Patient > 75% Assistive device: No Device Max distance: 175'   Walk 10 feet activity   Assist     Assist level: Contact Guard/Touching assist Assistive device: No Device   Walk 50 feet activity   Assist Walk 50 feet with 2 turns activity did not occur: Safety/medical concerns  Assist level: Minimal Assistance - Patient > 75% Assistive device: No Device    Walk 150 feet activity   Assist Walk 150 feet activity did not occur: Safety/medical concerns  Assist level: Minimal Assistance - Patient > 75% Assistive device: No Device    Walk 10 feet on uneven surface  activity   Assist Walk 10 feet on uneven surfaces activity did not occur: Safety/medical concerns          Wheelchair     Assist Is the patient using a wheelchair?: Yes Type of Wheelchair: Manual    Wheelchair assist level: Dependent - Patient 0%      Wheelchair 50 feet with 2 turns activity    Assist        Assist Level: Dependent - Patient 0%   Wheelchair 150 feet activity     Assist      Assist Level: Dependent - Patient 0%   Blood pressure (!) 153/67, pulse 61, temperature 98.1 F (36.7 C), resp. rate 18, height 5\' 4"  (1.626 m), weight 90.6 kg, SpO2 97%.  Medical Problem List and Plan: 1. Functional deficits secondary to metabolic encephalopathy due to sepsis secondary to catheter peritonitis and hypoxemia              -patient may not shower             -ELOS/Goals: 16-18 days, PT/OT Sup, SLP min A -tentative discharge date 9-9           -Continue CIR therapies including PT, OT, and SLP   -Will review records from [facility] -- has worsened since approximately 8/22, possible 8/19, consistent with records from nursing at [facility] with current behavior.   -Therapy QD schedule  -Therapy suggest having patient set up for short time before his therapy sessions to help him wake up.  I think this would be a good idea  Con't CIR PT, OT and SLP  Patient was seen by palliative care, changed to DNR status  -SNF vs Home discharge   2.  Antithrombotics: -DVT/anticoagulation:  Pharmaceutical: Heparin, 03/23/23 changed to Lovenox yesterday to help improved compliance             -antiplatelet therapy: aspirin and Plavix   3. Pain Management: Tylenol as needed             -Lidoderm patches to back  -Elavil 25 mg at bedtime for nerve pain and sleep 8/27  -8/29: Pain well controlled on Elavil; DC lyrica, PRN Tylenol/oxy 2.5-5 mg d/t CKD  8-30: Appears patient has been getting pain medications together overnight along with antipsychotics, will discuss with nursing  8-31: Patient having an difficulty endorsing needs and not utilizing PRNs, scheduled oxycodone 5 mg 3 times  daily and has 2.5 mg prn 3 times daily available.   9-1: Not utilizing PRNs, minimal complaints of pain, will discontinue scheduled given lethargy today..  -muscle rub R shoulder  9/16 patient reports pain is under control overall   9/26 no report of pain noted 4. Mood/Behavior/Sleep: LCSW to evaluate and provide emotional support             -  continue Seroquel 50 mg TID             -clonazepam 0.5 mg TID prn             -Geodon 10 mg IM q 12 hours prn             -antipsychotic agents: n/a  -8-27: Agitated by soft restraints in bed, transition to 4 side rails, Posey alarm, and TeleSitter for now.  Veil bed on standby if needed, however feel we should avoid this if possible due to likelihood of agitating patient further   - 8/28: Placed in veil bed; wrist restraints for dialysis. Scheduled Geodon 20 mg twice daily; discontinue Seroquel and clonazepam due to no perceivable benefit. Add as needed Zyprexa 5 mg twice daily p.o. or IM due to recent cardiac arrhythmias and concern for QTc prolongation. As needed ramelteon for sleep. Stop gabapentin due to possible side effect of agitation  - 8/28: No improvement except temporarily with Geodon; add depakote 125 mg q8h for ongoing aggitation, mood stabilization. Increase elavil to 50 mg at bedtime.  Workup ongoing.    - 8/29: EEG negative, labs normal. MRI pending. Sleeping well and calm since starting depakote and increase in elavil. Will monitor tonight and wean Geodon in AM if appropriate.   8-30: MRI showing chronic microvascular changes, otherwise normal.  Increasingly calm and appropriate during the day, with ongoing agitation at nighttime.  Will increase nighttime Geodon to 40 mg; will discuss sleep log with nursing, as it has not been performed.  8-31 no sleep overnight per sleep log, again getting agitated around 9 PM and 5 AM.  Not responsive to as needed Zyprexa, responsive to Ativan.  Attempted to wean off scheduled a.m. Geodon today, but patient  became extremely agitated and unable to redirect, calm down with readministration of medication.  Resume Geodon 20 mg every morning/40 mg every afternoon, increased Elavil to 75 mg nightly, adding trazodone 50 mg nightly as needed.  P.o./IV Ativan 0.5 mg every 6 hours for agitation.  9-1: slept well overnight with trazodone and increased Elavil, no PRNs used.  Unfortunately, oversedated today.  Discontinue scheduled pain medications as above, start wean of Elavil to 50 mg x2 days -> 25 mg x2 days -> 10 mg x2 days, off due to no apparent benefit and no complaints of pain.  Reduce Geodon from 20 mg every morning/40 mg every evening to 20 mg twice daily.  Continue trazodone as needed for sleep, as this seems to be the difference for a restful night yesterday.  If he remains overly sedated, would dose reduce and DC Depakote next.  Keep Ativan as needed d/t dialysis not having other options available.  9/2-3--non agitated but still sleepy   -dc'ed depakote as I doubt it's doing much at this dose anyway   -continue geodon at 20mg  bid   -scheduled trazodone at HS with back up dose if needed    -sleep was intermittent last night   -requires enclosure bed for safety  9/4 continue enclosure bed for now, consider trying DC of this device early next week  -9/5 appears to be doing better overall this morning, continues to have intermittent agitation at times.  Continue enclosure bed  -9/6 agitation appears to be improving, patient is skeptical about medications and has been declining some of these.  Will discontinue multivitamin, RisaQuad to try to decrease pill burden  -9/7 continues to have intermittent episodes of agitation however overall improving, appears to be taking  most of his medications  9/9 agitation and mental status appears to be improving -discussing discontinuation of enclosure bed with team 9/10 Geodon dose increased to 40 mg at night, continue 20 mg in the morning 9/11 No acute events noted, will  check with team regarding DC enclosure bed, nursing reports he did ok last night but had dialysis 9/12 continues to have sundowning/waxing waning confusion and agitation, continue enclosure bed for now, will caution with Geodon will continue current dose but hold off on any escalation as this can contribute to bradycardia 9/16 no longer using enclosure bed, continue Geodon 20 mg twice daily for now due to intermittent agitation 9/17 patient with agitation and confusion at night, has sedation during the day.  Discussed with psychiatry Dr. Gasper Sells.  Will discontinue Geodon and try Abilify tonight.  Dr. Gasper Sells also suggested considering donepezil to shorten hallucinations of delirium.  Psychiatry consult placed, will see tomorrow.  Appreciate assistance 9/18 no behavioral related notes overnight, psychiatry plans to see him today.  Appreciate assistance 9/19 patient was seen by psychiatry yesterday, recommended continue Abilify, consider donepezil or memantine, suggested avoiding stimulants.  Will continue current regimen for now as it appears he is doing better.  He appears to be less sedated and did not see frequent notes of agitation but this regimen thus far. 9/20 recheck CMP/ammonia/VPA level today due to continued drowsiness  9/21- Depakote level 16- too low- Ammonia 19- low- asleep this AM, but haven't been called today about sedation/lethargy.  9/22- more hallucinations per nursing- labs attempted- couldn't get- couldn't get U/A results since so much hematuria. Trying to get labs, to see if getting ill vs missing HD? 9/23 will go back to 3 times daily for Depakote to help avoid agitation 9/24 continue current regimen with Abilify and start memantine 5 mg daily 9/27 increase Abilify to 7.5 mg daily to help reduce agitation 5. Neuropsych/cognition: This patient is not capable of making decisions on his own behalf   -recommend neuropsych consult   - 8/27: Ongoing aggitation; Consult  hospitalist for further workup on potential contributors to encephalopathy. Pending UA, LA 2.1-> 1.4; otherwise negative so far -Delirium precautions, sleep log, Veiled bed for safety and to minimize restraints -Wife has expressed that she is not interested in palliative care at this time; hopefully with above measures patient can get dialysis in the next 48 hours to limit worsening uremic encephalopathy and medical decline    - 8/29: Aggitation much improved, able to get some dialysis last night and today. See above for medication changes. Calling wife tonight to discuss.   8-30: Still with moderate cognitive deficits, however appears once patient is less agitated he is better able to concentrate and has better orientation.  MRI negative for acute findings, monitor.  9/3 agitation better. More sedated than anything at this point. Confused as well.    -depakote stopped   -continue other meds as rx'ed  9/21- pt refused Depakote this AM "since doesn't have seizures".    6. Skin/Wound Care: Routine skin care checks             -sacral skin off-loading/protection   7. Fluids/Electrolytes/Nutrition: Strict Is and Os and follow-up chemistries per nephrology             -renal diet/ 1200 cc FR  - Renvela 800 mg 3 times daily for hyperphosphatemia added per nephrology 8-27   9/21- Last Cr 7.07 and BUN 22 8: Hypotension: monitor TID and prn             -  continue midodrine as needed during dialysis  Normotensive, monitor  9-1: Consistently 90s over 60s, has been stable but given findings of microvascular changes on MRI, may be too low for patient to perfuse properly.  Will add midodrine 5 mg tid and monitor.  DC Coreg as heart rate has been low to normal; may resume at low-dose once blood pressure improved.   9/2 continue with ccurrent regimen. Bp's hr remain low   -elavil wean should help  9/9-10 BP stable, continue midodrine and monitor  9/13 DC midodrine today as BP has been higher, can restart if  needed  Improved, cont to monitor  9/27 BP stable overall    04/15/2023    5:00 AM 04/15/2023    4:59 AM 04/14/2023    9:53 PM  Vitals with BMI  Weight 199 lbs 12 oz    Systolic  153 123  Diastolic  67 74  Pulse  61 84      9: Hyperlipidemia: continue statin   10: CAD s/p CABG 2023: on DAPT and statin.  - troponin elevated but stable, monitor 8/27   11: DM-2: A1c = 6.3% (at home on Lantus 22 units at bedtime, Tradjenta 5 mg daily)        -monitor PO intake             -continue SSI (0-6 units) does not appear to be requiring coverage   - 9/6 DC SSI, change CBGs to daily  9/8 discontinue CBGs  9/16 glucose still controlled on BMP yesterday   9/23 glucose 100 on BMP yesterday  12: ESRD: HD on Tues, Thurs, Sat -nephrology made aware   - Unable to tolerate 8/27 d/t aggitation, stopped early 8/28;  - attempting again 8/29, stopped early again -Per nephrology, redirectable with gum -Have to switch restraints during dialysis sessions from veil bed to regular bed, then resume veil bed order once at rehab -9/9 tolerating HD better 9/18 patient's wife indicates she would be able to stay with him during dialysis after discharge   9/22- pt refused/too agitated ot get HD yesterday  9/24 nephrology does not feel like he is a candidate for outpatient dialysis at this time due to agitation  9/27 will attempt hemodialysis in the mornings, premedicated to help reduce risk of agitation   13: COPD/OSA; chronic home 2L O2 (Symbicort, Ventolin, prednisone 40 mg daily at home)             -continue symbicort  -Added home oxygen back 8-27   14: GERD: continue Protonix   15: Non-sustained VT/pAF/bradycardia: per cardiology, Dr. Algie Coffer: -continue carvedilol 6.25 mg BID; consider increasing to 12.5 mg BID before starting low dose amiodarone  -consider Eliquis and discontinue asa and Plavix in one week if no active GI bleeding and stable hemoglobin from 8/24 -hemoglobin has remained stable but  very low around 7, no recurrent A-fib, monitor for now given agitation/fall 8-31 and consider initiating Eliquis when safe -Continue mag-ox to avoid hypomagnesemia -8-27: Single instance of bradycardia this morning 40s, may have been missed read, monitor through today and do not further adjust beta-blocker medications  -9-3 hr controlled after holding coreg -9/4-5 HR stable, continue to monitor -9/9 consider Eliquis this week if agitation continues to improve -9/10 will hold off on Eliquis due to sundowning at night, will see how he does with increase Geodon -9/11 EKG for bradycardia episode today, 2nd degree A-V block mobitz type 1.  Continue to hold beta blocker,9/11 will restart eliquis at 2.5  mg BID dose. Will consult cardiology regarding need for ASA/plavix and coreg.  Dr. Elease Hashimoto does not appear convinced that he has A-fib 9/12 appreciate cardiology consult yesterday, DAPT transition to daily aspirin 81 only and Eliquis was discontinued, caution with Geodon as this can contribute to bradycardia 9/13 will decrease Geodon nighttime dose to 20 due to episodes of bradycardia 9/17 will discontinue Geodon due to variable cardiac effects 9/24 bradycardia continues to be improved after Geodon discontinued     04/15/2023    5:00 AM 04/15/2023    4:59 AM 04/14/2023    9:53 PM  Vitals with BMI  Weight 199 lbs 12 oz    Systolic  153 123  Diastolic  67 74  Pulse  61 84       16: HFrEF: Daily weights. Monitor for signs of overload  - 9/7 continue daily weights, fluid level management per hemodialysis team Weight reviewed and stable  9/21- 94.3- down 3 kg-   9/22- no weight for today- too agitated per nursing Filed Weights   04/14/23 0500 04/14/23 1213 04/15/23 0500  Weight: 95.8 kg 92.2 kg 90.6 kg      17: Liver nodularity on CT scan: LFTs reported to be normal. Avoid hepatotoxic medications    -Abdominal ultrasound earlier this admission with minimal ascites; ammonia within normal  limits  -8/31: Did discuss with nephrology whether p.o. lactulose would be beneficial despite normal ammonia, after reviewing records unsure that this would be beneficial    18: Epistaxis - resolved             -previously improved with flonase and saline spray   19: Morbid Obesity. Dietary counseling Body mass index is 39.01 kg/m.  20. Bradycardic: continue to monitor HR TID  -Discontinue Geodon  -9/19 heart rate in the 80s, stable  HR stable  21. HTN: resolved after HD, continue to monitor TID  9/19-20 intermittently elevated, continue to monitor.  Cautious with medication addition to avoid hypotension  9/27 BP controlled   22. Hematuria  9/22- required in/out to check U/A- having new hematuria- nursing to keep an eye- since also bleeding from penis a little- monitor   9/23 no wBCs  on UA, follow  urine culture-Staphylococcus hemolyticus-discussed with pharmacy and ID pharmacy.  Think this is more likely contaminant  9/25 discuss with nephrology about possible treatment or other evaluation options  Spoke with Dr. Ronalee Belts, most likely contaminant.  Recommended treating only if symptomatic.  If hematuria continues consider urology input  9/27 urine has improved to clear yellow  LOS: 34 days A FACE TO FACE EVALUATION WAS PERFORMED  Erick Colace 04/15/2023, 11:55 AM

## 2023-04-15 NOTE — Progress Notes (Signed)
Subjective: Seen sitting in wheelchair at nurses station..  No current complaints did have 3-hour dialysis yesterday tolerated with out significant problems as prior behavioral problems  !!  Objective Vital signs in last 24 hours: Vitals:   04/14/23 2153 04/15/23 0459 04/15/23 0500 04/15/23 0801  BP: 123/74 (!) 153/67    Pulse: 84 61    Resp: 18 18    Temp: 97.8 F (36.6 C) 98.1 F (36.7 C)    TempSrc:      SpO2: 95% 97%  97%  Weight:   90.6 kg   Height:       Weight change: -3.6 kg  Physical Exam: General: sedate  elderly male  NAD Heart: RRR , No MRG Lungs: CTA , nonlabored  Abdomen: NABS , Soft , Nt, ND Extremities: TRace bipedal edema Dialysis Access: L Arm AVf + bruit       OP dialysis Orders: Previously on CCPD thru [facility] - changed to HD - will need outpatient HD unit established prior to d/c. 4hr, 3K, EDW ~96, L AVF   Assessment/Plan: 1. Debility - in CIR 2. Encephalopathy/agitation: Waxing/waning agitation, dementia sundowning. No acute findings on imaging. He has been disruptive during HD.  Questionable needs sitter. But sitters are not allowed in the hospital HD unit. Have d/w HD staff -yesterday appeared to be a good treatment for him without problems previously they state that most if not all of his recent HD sessions have been problematic due to resistance and aggressive behavior. have been using IV ativan to assist w/ getting him through dialysis. Use of IV Ativan can not be done in OP HD units. VIR  will not place catheter, agitation is not an indication to place cathter.  Rehab team adjusting meds.  3. ESRD: Transitioned from PD to HD recently on TTS schedule. Using old AVF which is still functional. Has been off schedule for HD this week d/t behavior.  Family would prefer TTS HD. Coordinate with rehab team so they know to premedicate before he comes to [facility].  4.Hypertension/volume: BP higher, midodrine 5mg  stopped. UF as tolerated.  5. Anemia: Hgb  10.3--on Aranesp weekly. Last Tsat 38% with ferritin 2175.  No iron.  6. Metabolic bone disease: CorrCa 16.1 , Phos acceptable. No VDRA. 7.  Nutrition:  Alb 2.6, continue supps.  Renal diet w/fluid restrictions.  8.  Hx NSVT: On Coreg 10.  Recent culture negative PD cath-related peritonitis and associated fungemia + enterococcus bacteremia - underwent PD cath removal, TDC placement and removal, temp HD cath placement  and removal, and line holiday. Completed IV unasyn, IV micafungin and cipro on 8/15. TDC not replaced cause he had functional AVF.   11. Hematuria - dirty UA. Coag neg staph on Ucx - defer to primary team  12. GOC - Seen by palliative care - Now DNR/DNI wants to continue dialysis.  13. Dispo: For SNF placement. Regarding outpatient dialysis placement- per renal navigator he will need new referral since he has been gone from his prior clinic so long. Family members all willing to sit with him during treatments.  At this time he is not appropriate for outpatient dialysis d/t ongoing agitation and requiring restraints and multiple staff to reorient during treatments.  Lenny Pastel, PA-C [facility] [phone] 04/15/2023,11:07 AM  LOS: 34 days   Labs: Basic Metabolic Panel: Recent Labs  Lab 04/09/23 1219 04/11/23 1008 04/14/23 1152  NA 138 136 133*  K 4.1 4.2 4.7  CL 99 97*  94*  CO2 26 25 23   GLUCOSE 100* 152* 154*  BUN 51* 45* 50*  CREATININE 12.78* 12.26* 12.62*  CALCIUM 9.0 9.1 9.3  PHOS 4.7* 4.7* 4.6   Liver Function Tests: Recent Labs  Lab 04/09/23 1219 04/11/23 1008 04/14/23 1152  ALBUMIN 2.5* 2.4* 2.6*   No results for input(s): "LIPASE", "AMYLASE" in the last 168 hours. No results for input(s): "AMMONIA" in the last 168 hours. CBC: Recent Labs  Lab 04/08/23 1517 04/09/23 1219 04/11/23 1008 04/14/23 1152  WBC 4.4 3.9* 4.5 4.3  NEUTROABS 2.9  --   --   --   HGB 9.4* 9.6* 9.5* 10.3*  HCT 31.5* 32.3* 31.9* 35.1*  MCV 91.8  91.8 94.4 90.9  PLT 169 185 186 242   Cardiac Enzymes: No results for input(s): "CKTOTAL", "CKMB", "CKMBINDEX", "TROPONINI" in the last 168 hours. CBG: No results for input(s): "GLUCAP" in the last 168 hours.  Studies/Results: No results found. Medications:   (feeding supplement) PROSource Plus  30 mL Oral BID BM   ARIPiprazole  7.5 mg Oral QHS   aspirin EC  81 mg Oral Daily   atorvastatin  80 mg Oral Daily   Chlorhexidine Gluconate Cloth  6 each Topical Q0600   darbepoetin (ARANESP) injection - DIALYSIS  150 mcg Subcutaneous Q Sun-1800   divalproex  125 mg Oral Q8H   feeding supplement  237 mL Oral BID WC   Gerhardt's butt cream  1 Application Topical QID   lidocaine  1 patch Transdermal Q2200   lidocaine  1 patch Transdermal Q24H   memantine  5 mg Oral Daily   mometasone-formoterol  2 puff Inhalation BID   pantoprazole  40 mg Oral BID   traZODone  50 mg Oral QHS

## 2023-04-15 NOTE — Plan of Care (Signed)
  Problem: RH SAFETY Goal: RH STG ADHERE TO SAFETY PRECAUTIONS W/ASSISTANCE/DEVICE Description: STG Adhere to Safety Precautions With cues Assistance/Device. Outcome: Progressing   Problem: RH PAIN MANAGEMENT Goal: RH STG PAIN MANAGED AT OR BELOW PT'S PAIN GOAL Description: < 4 with prns Outcome: Progressing

## 2023-04-15 NOTE — Progress Notes (Signed)
Daily Progress Note   Patient Name: [name] [name]       Date: 04/15/2023 DOB: [birth year]  Age: 72 y.o. MRN#: [id number] Attending Physician: Angelina Sheriff, DO Primary Care Physician: Benetta Spar, MD Admit Date: 03/12/2023  Reason for Consultation/Follow-up: Establishing goals of care   Length of Stay: 34  Current Medications: Scheduled Meds:   (feeding supplement) PROSource Plus  30 mL Oral BID BM   ARIPiprazole  7.5 mg Oral QHS   aspirin EC  81 mg Oral Daily   atorvastatin  80 mg Oral Daily   Chlorhexidine Gluconate Cloth  6 each Topical Q0600   darbepoetin (ARANESP) injection - DIALYSIS  150 mcg Subcutaneous Q Sun-1800   divalproex  125 mg Oral Q8H   feeding supplement  237 mL Oral BID WC   Gerhardt's butt cream  1 Application Topical QID   lidocaine  1 patch Transdermal Q2200   lidocaine  1 patch Transdermal Q24H   memantine  5 mg Oral Daily   mometasone-formoterol  2 puff Inhalation BID   pantoprazole  40 mg Oral BID   traZODone  50 mg Oral QHS    Continuous Infusions:   PRN Meds: acetaminophen, bisacodyl, guaiFENesin-dextromethorphan, LORazepam **OR** LORazepam, Muscle Rub, ondansetron **OR** ondansetron (ZOFRAN) IV, oxyCODONE, traZODone  Physical Exam Vitals reviewed.  Constitutional:      General: He is sleeping.  Cardiovascular:     Rate and Rhythm: Normal rate.  Pulmonary:     Effort: Pulmonary effort is normal.  Neurological:     Mental Status: He is easily aroused. He is disoriented.             Vital Signs: BP (!) 153/67 (BP Location: Right Arm)   Pulse 61   Temp 98.1 F (36.7 C)   Resp 18   Ht 5\' 4"  (1.626 m)   Wt 90.6 kg   SpO2 97%   BMI 34.28 kg/m  SpO2: SpO2: 97 % O2 Device: O2 Device: Room Air O2 Flow Rate: O2 Flow Rate  (L/min): 2 L/min   Patient Active Problem List   Diagnosis Date Noted   Agitation 04/13/2023   Hematuria 04/13/2023   Acute metabolic encephalopathy 03/12/2023   HFrEF (heart failure with reduced ejection fraction) (HCC) 03/12/2023   Morbid obesity (HCC) 03/12/2023   Encephalopathy 03/12/2023  Epistaxis 03/12/2023   PAF (paroxysmal atrial fibrillation) (HCC) 03/12/2023   Long term (current) use of insulin (HCC) 03/12/2023   ESRD on dialysis (HCC) 03/12/2023   Debility 03/12/2023   Acute encephalopathy 02/15/2023   Gait abnormality 09/11/2022   Tremor 09/11/2022   Memory loss 09/11/2022   Hypertension associated with diabetes (HCC) 06/15/2022   Heart block AV second degree 06/15/2022   Peritoneal dialysis catheter in place [facility]) 06/14/2022   End stage renal disease on dialysis (HCC) 06/14/2022   Hyperglycemia 06/12/2022   AV block 06/07/2022   S/P CABG x 2 06/05/2022   Second degree AV block, Mobitz type I 12/27/2021   Unstable angina (HCC)    NSTEMI (non-ST elevated myocardial infarction) [facility])    Rectal bleeding 07/13/2021   Allergic rhinitis 04/07/2020   Asthma 10/02/2019   Pain in left foot 12/18/2018   Chest pain 08/29/2018   Constipation 02/28/2018   ESRD (end stage renal disease) (HCC) 11/13/2017   Foul smelling urine 12/14/2016   Coronary artery disease involving native coronary artery of native heart without angina pectoris 10/27/2016   Chronic kidney disease, stage IV (severe) (HCC) 10/27/2016   Morbid obesity due to excess calories (HCC) 03/22/2016   CAD (coronary artery disease)    Erectile dysfunction 08/16/2015   Hemorrhoids 07/05/2015   Vitamin D deficiency 06/04/2015   OSA on CPAP 05/14/2015   Normocytic anemia 03/24/2015   Fatty liver 12/01/2014   Back pain 06/05/2013   OA (osteoarthritis) of knee 03/15/2011   CTS (carpal tunnel syndrome) 03/15/2011   Type 2 diabetes mellitus with chronic kidney disease on chronic dialysis, with long-term current  use of insulin (HCC) 09/29/2008   Hyperlipidemia 09/29/2008   Essential hypertension 09/29/2008   GERD 09/29/2008    Palliative Care Assessment & Plan   Patient Profile: 72 y.o. male  with past medical history of  CAD, PUD, DM, OAS, HLD, ESRD who was on PD which was removed on 02/19/23, LUE AVF in place, IR was consulted for image guided tunneled HD cath placement.    Patient was admitted to [facility] on 02/15/23 after presenting to [facility] ED with acute metabolic encephalopathy assumed due to recent Covid infection, PD catheter malfunctioning, later found to have sepsis secondary to peritoneal dialysis associated catheter peritonitis and polymicrobial bacteremia/fungemia, s/p PD removal on 02/19/23. Hospital course complicated by worsening encephalopathy with hypoxemia.    Patient had LUE AVF in place, he underwent fistulogram with balloon venoplasty on 8/14 by Dr. Karin Lieu, the fistula was used for HD treatment on 8/16. Patient was discharged to [facility] on 8/16, he was admitted to CIR on 03/12/23. In CIR, patient continued to be confused and agitated, psychiatry is following. Patient has been disruptive during HD at times, per nephrology note he was too combative for HD on 04/07/23, agitation appears to be made worse with cannulation of AVF.    Assessment: Patient is sitting in his wheelchair at the nurses' station. Nursing reports he has been agitated this morning requiring frequent redirecting and frequent interaction.   He had IV ativan yesterday before dialysis. He tolerated 3 hours but note states he was very confused and agitated, unable to re-direct, and that he couldn't stay still through treatment. Concern remains that he is not appropriate for outpatient dialysis at this time.  PMT will continue to support.   12:20: Attempted to call wife Leshaun Gacke. Unable to leave a message.  Recommendations/Plan: DNR Limited scope of treatment- no intubation Time for outcomes-- hoping to find  a way to  continue dialysis Spiritual care consult- for supporting patient's wife Continued PMT support   Code Status:    Code Status Orders  (From admission, onward)           Start     Ordered   04/08/23 1629  Do not attempt resuscitation (DNR)- Limited -Do Not Intubate (DNI)  (Code Status)  Continuous       Question Answer Comment  If pulseless and not breathing No CPR or chest compressions.   In Pre-Arrest Conditions (Patient Is Breathing and Has A Pulse) Do not intubate. Provide all appropriate non-invasive medical interventions. Avoid ICU transfer unless indicated or required.   Consent: Discussion documented in EHR or advanced directives reviewed      04/08/23 1629        Extensive chart review has been completed prior to seeing the patient including labs, vital signs, imagine, progress/consult notes, orders, medications, and available advance directive documents.  Time spent: 25 minutes  Thank you for allowing the Palliative Medicine Team to assist in the care of this patient.     Sherryll Burger, NP  Please contact Palliative Medicine Team phone at [phone] for questions and concerns.

## 2023-04-16 ENCOUNTER — Encounter (HOSPITAL_COMMUNITY): Payer: Medicare Other

## 2023-04-16 DIAGNOSIS — G9341 Metabolic encephalopathy: Secondary | ICD-10-CM | POA: Diagnosis not present

## 2023-04-16 NOTE — Progress Notes (Signed)
   04/16/23 0740  What Happened  Was fall witnessed? Yes  Who witnessed fall? Tilden Dome, LPN  Patients activity before fall other (comment) (Eating breakfast)  Point of contact hip/leg;other (comment) (Abdomen)  Was patient injured? No  Patient found on floor  Found by Staff-comment Tilden Dome, LPN)  Stated prior activity other (comment) (Eating.)  Provider Notification  Provider Name/Title Elijah Birk, DO  Date Provider Notified 04/16/23  Time Provider Notified 0745  Method of Notification Face-to-face  Notification Reason Fall  Provider response In department  Date of Provider Response 04/16/23  Time of Provider Response 0800  Follow Up  Family notified Yes - comment Micah Flesher)  Time family notified 0800  Additional tests No  Progress note created (see row info) Yes  Adult Fall Risk Assessment  Risk Factor Category (scoring not indicated) Fall has occurred during this admission (document High fall risk)  Patient Fall Risk Level High fall risk  Adult Fall Risk Interventions  Required Bundle Interventions *See Row Information* High fall risk - low, moderate, and high requirements implemented  Additional Interventions Family Supervision;Camera surveillance (with patient/family notification & education);HeadStart bed sensor (education/return demonstration);Lap belt while in chair/wheelchair (Rehab only);Safety Sitter/Safety Rounder;Use of appropriate toileting equipment (bedpan, BSC, etc.);Room near nurses station  Fall intervention(s) refused/Patient educated regarding refusal Bed alarm;Nonskid socks;Yellow bracelet  Screening for Fall Injury Risk (To be completed on HIGH fall risk patients) - Assessing Need for Floor Mats  Risk For Fall Injury- Criteria for Floor Mats Previous fall this admission  Will Implement Floor Mats Yes  Vitals  Temp 98.7 F (37.1 C)  Temp Source Oral  BP 121/76  MAP (mmHg) 90  BP Location Right Arm  BP Method Automatic  Patient  Position (if appropriate) Sitting  Pulse Rate 78  Pulse Rate Source Monitor  Resp 18  Oxygen Therapy  SpO2 100 %  O2 Device Room Air  Pain Assessment  Pain Scale 0-10  Pain Score 0  Neurological  Neuro (WDL) X  Level of Consciousness Alert  Orientation Level Oriented to person;Oriented to place  Cognition Appropriate judgement;Appropriate safety awareness;Impulsive;Poor attention/concentration;Poor judgement;Poor safety awareness  Speech Clear  Motor Function/Sensation Assessment Grip;Motor response  R Hand Grip Moderate  L Hand Grip Moderate  RUE Motor Response Purposeful movement  LUE Motor Response Purposeful movement  RLE Motor Response Purposeful movement  LLE Motor Response Purposeful movement  Neuro Symptoms Other (Comment) (Confusion)  Neuro symptoms relieved by Rest;Anti-anxiety medication  Musculoskeletal  Musculoskeletal (WDL) X  Integumentary  Integumentary (WDL) WDL  Skin Color Appropriate for ethnicity  Skin Turgor Non-tenting

## 2023-04-16 NOTE — Progress Notes (Signed)
[facility] Progress Note   Subjective:   Patient seen and examined at nurses station.  Sitting in wheelchair.  No specific complaints.  Says dialysis went ok last time.  Denies CP, SOB, abdominal pain and n/v/d.   Objective Vitals:   04/15/23 1928 04/16/23 0608 04/16/23 0740 04/16/23 1311  BP:  126/66 121/76 138/81  Pulse:  84 78 73  Resp:  18 18 20   Temp:  98.2 F (36.8 C) 98.7 F (37.1 C) 98.7 F (37.1 C)  TempSrc:   Oral   SpO2: 97% 97% 100% 100%  Weight:      Height:       Physical Exam General:alert male in NAD Heart:RRR, no mrg Lungs:CTAB, nml WOB on RA Abdomen:soft, NTND Extremities:no LE edema Dialysis Access: LU AVF +b/t   Filed Weights   04/14/23 0500 04/14/23 1213 04/15/23 0500  Weight: 95.8 kg 92.2 kg 90.6 kg    Intake/Output Summary (Last 24 hours) at 04/16/2023 1437 Last data filed at 04/16/2023 0700 Gross per 24 hour  Intake 240 ml  Output --  Net 240 ml    Additional Objective Labs: Basic Metabolic Panel: Recent Labs  Lab 04/11/23 1008 04/14/23 1152  NA 136 133*  K 4.2 4.7  CL 97* 94*  CO2 25 23  GLUCOSE 152* 154*  BUN 45* 50*  CREATININE 12.26* 12.62*  CALCIUM 9.1 9.3  PHOS 4.7* 4.6   Liver Function Tests: Recent Labs  Lab 04/11/23 1008 04/14/23 1152  ALBUMIN 2.4* 2.6*   CBC: Recent Labs  Lab 04/11/23 1008 04/14/23 1152  WBC 4.5 4.3  HGB 9.5* 10.3*  HCT 31.9* 35.1*  MCV 94.4 90.9  PLT 186 242   Blood Culture    Component Value Date/Time   SDES URINE, CATHETERIZED 04/08/2023 0915   SPECREQUEST  04/08/2023 0915    NONE Performed at [facility], [address], [place], [place] [address]    CULT >=100,000 COLONIES/mL STAPHYLOCOCCUS HAEMOLYTICUS (A) 04/08/2023 0915   REPTSTATUS 04/10/2023 FINAL 04/08/2023 0915    Medications:   (feeding supplement) PROSource Plus  30 mL Oral BID BM   ARIPiprazole  7.5 mg Oral QHS   aspirin EC  81 mg Oral Daily   atorvastatin  80 mg Oral Daily    Chlorhexidine Gluconate Cloth  6 each Topical Q0600   darbepoetin (ARANESP) injection - DIALYSIS  150 mcg Subcutaneous Q Sun-1800   divalproex  125 mg Oral Q8H   feeding supplement  237 mL Oral BID WC   Gerhardt's butt cream  1 Application Topical QID   lidocaine  1 patch Transdermal Q2200   lidocaine  1 patch Transdermal Q24H   memantine  5 mg Oral Daily   mometasone-formoterol  2 puff Inhalation BID   pantoprazole  40 mg Oral BID   traZODone  50 mg Oral QHS    Dialysis Orders: Previously on [facility]CPD thru [facility] - changed to HD - will need outpatient HD unit established prior to d/c. 4hr, 3K, EDW ~96, L AVF   Assessment/Plan: 1. Debility - in CIR 2. Encephalopathy/agitation: Waxing/waning agitation, dementia, sundowning. No acute findings on imaging. He has been disruptive during HD.  Likely needs family as sitter for safety, but sitters are not typically allowed in the hospital HD unit. Management agreed for family to sit for 1 HD this week, hopefully tomorrow, to see if it improves behavior.  If not will need to strongly consider transition to hospice as he will not be a  candidate for outpatient dialysis d/t concern for patient/staff safety.  Previously discussed w/HD staff - last treatment noted to be better with no significant behavior problems, previously most if not all of his recent HD sessions have been problematic due to resistance and aggressive behavior. Have been using IV ativan to assist w/ getting him through dialysis. Use of IV Ativan can not be done in OP HD units. IR will not place catheter, agitation is not an indication to place cathter.  Rehab team adjusting meds.  3. ESRD: Transitioned from PD to HD recently on TTS schedule. Using old AVF which is still functional. Off schedule last week for HD this week d/t behavior.  Family would prefer TTS HD. Coordinate with rehab team so they know to premedicate before he comes to [facility].  4.Hypertension/volume: BP in goal.  Midodrine 5mg  stopped. UF as tolerated.  5. Anemia: Hgb 10.3-on Aranesp weekly. Last Tsat 38% with ferritin 2175.  No iron.  6. Metabolic bone disease: CorrCa 40.9 , Phos acceptable. No VDRA. 7.  Nutrition:  Alb 2.6, continue supps.  Renal diet w/fluid restrictions.  8.  Hx NSVT: On Coreg 10.  Recent culture negative PD cath-related peritonitis and associated fungemia + enterococcus bacteremia - underwent PD cath removal, TDC placement and removal, temp HD cath placement  and removal, and line holiday. Completed IV unasyn, IV micafungin and cipro on 8/15. TDC not replaced cause he had functional AVF.   11. Hematuria - dirty UA. Coag neg staph on Ucx - defer to primary team  12. GOC - Seen by palliative care - Now DNR/DNI wants to continue dialysis. See above. 13. Dispo: For SNF placement. Regarding outpatient dialysis placement- per renal navigator he will need new referral since he has been gone from his prior clinic so long. Family members all willing to sit with him during treatments.  At this time he is not appropriate for outpatient dialysis d/t safety concerns with ongoing agitation and requiring restraints and multiple staff to reorient during treatments.  Virgina Norfolk, PA-C [facility] 04/16/2023,2:37 PM  LOS: 35 days

## 2023-04-16 NOTE — Progress Notes (Signed)
Physical Therapy Session Note  Patient Details  Name: [name] [name] MRN: [id number] Date of Birth: 1950/09/24  Today's Date: 04/16/2023 PT Individual Time: 0807-0832 PT Individual Time Calculation (min): 25 min   Short Term Goals: Week 5:  PT Short Term Goal 1 (Week 5): To continue to consistently meet LTG  Skilled Therapeutic Interventions/Progress Updates:    Pt presents behind nurse's station, pt refusing participation due to confusion. Therapist discussed with RN who reports pt has not yet received AM medicine and recently had fall without injury due to pt impulsively exiting bed. Session focused on therapeutic use of self to engage pt with session and for taking medications and gait training short distance. Pt requires max cues to attend to direction for taking medicine with therapist assist with cues. Following medication administration pt transported to day room dependenlty in [facility]. Pt completes sit<>stand with min assist, HHA x3 attempts due to pt reporting having "weights" on, requires gentle redirection to faciltate participation. Pt then ambulates 10' with R HHA min assist, 2nd person WC follow due to impulsive sitting. Pt demonstrates short step length and increased cadence. Pt transported back to nurses station where he remains with all needs within reach, chair alarm donned and activated at end of session.  Therapy Documentation Precautions:  Precautions Precautions: Fall Restrictions Weight Bearing Restrictions: No    Therapy/Group: Individual Therapy  Edwin Cap PT, DPT 04/16/2023, 9:32 AM

## 2023-04-16 NOTE — Progress Notes (Signed)
Occupational Therapy Session Note  Patient Details  Name: [name] [name] MRN: [id number] Date of Birth: September 08, 1950  Today's Date: 04/16/2023 OT Individual Time: 1000-1030 OT Individual Time Calculation (min): 30 min    Short Term Goals: Week 5:  OT Short Term Goal 1 (Week 5): Working to consistently meet LTG  Skilled Therapeutic Interventions/Progress Updates:    Pt greeted at nurses station asleep. OT able to wake pt intermittently to get to the gym. Pt very lethargic and needed multimodal cues to try to maintain alertness. Attempted sit<.stands, but pt unable to stay awake. [name] toss activity to try to get pt to initiate activity. Pt able to wake with this activity, but still needed moderate cues to continue attending to task and keep eyes open.. Pt returned to nurses station and left seated in wc with alarm on, call bell in reach, and needs met.   Therapy Documentation Precautions:  Precautions Precautions: Fall Restrictions Weight Bearing Restrictions: No Pain: Pain Assessment Pain Scale: 0-10 Pain Score: 0-No pain  Therapy/Group: Individual Therapy  [name] 04/16/2023, 10:14 AM

## 2023-04-16 NOTE — Progress Notes (Addendum)
Patient not cooperative, redirected throughout shift. Patient agitated at times but redirectable. Made several attempts to administer scheduled medication at 1400, PRN (pain medication and ativan). Patient requested medication and then refused it.   Wife came in and provided companionship.  Wendi Maya, PA made aware, notified oncoming nurse.    Tilden Dome, LPN

## 2023-04-16 NOTE — Plan of Care (Signed)
  Problem: Consults Goal: RH GENERAL PATIENT EDUCATION Description: See Patient Education module for education specifics. Outcome: Progressing   Problem: RH BOWEL ELIMINATION Goal: RH STG MANAGE BOWEL WITH ASSISTANCE Description: STG Manage Bowel with mod I Assistance. Outcome: Progressing Goal: RH STG MANAGE BOWEL W/MEDICATION W/ASSISTANCE Description: STG Manage Bowel with Medication with mod I Assistance. Outcome: Progressing   Problem: RH SAFETY Goal: RH STG ADHERE TO SAFETY PRECAUTIONS W/ASSISTANCE/DEVICE Description: STG Adhere to Safety Precautions With cues Assistance/Device. Outcome: Progressing   Problem: RH PAIN MANAGEMENT Goal: RH STG PAIN MANAGED AT OR BELOW PT'S PAIN GOAL Description: < 4 with prns Outcome: Progressing   Problem: RH KNOWLEDGE DEFICIT GENERAL Goal: RH STG INCREASE KNOWLEDGE OF SELF CARE AFTER HOSPITALIZATION Description: Patient and wife will be able to manage care at discharge with educational resources independently Outcome: Progressing   Problem: Safety: Goal: Non-violent Restraint(s) Outcome: Progressing   Problem: Education: Goal: Ability to describe self-care measures that may prevent or decrease complications (Diabetes Survival Skills Education) will improve Outcome: Progressing Goal: Individualized Educational Video(s) Outcome: Progressing   Problem: Coping: Goal: Ability to adjust to condition or change in health will improve Outcome: Progressing   Problem: Fluid Volume: Goal: Ability to maintain a balanced intake and output will improve Outcome: Progressing   Problem: Health Behavior/Discharge Planning: Goal: Ability to identify and utilize available resources and services will improve Outcome: Progressing Goal: Ability to manage health-related needs will improve Outcome: Progressing   Problem: Metabolic: Goal: Ability to maintain appropriate glucose levels will improve Outcome: Progressing   Problem: Nutritional: Goal:  Maintenance of adequate nutrition will improve Outcome: Progressing Goal: Progress toward achieving an optimal weight will improve Outcome: Progressing   Problem: Skin Integrity: Goal: Risk for impaired skin integrity will decrease Outcome: Progressing   Problem: Tissue Perfusion: Goal: Adequacy of tissue perfusion will improve Outcome: Progressing   Problem: Education: Goal: Knowledge of General Education information will improve Description: Including pain rating scale, medication(s)/side effects and non-pharmacologic comfort measures Outcome: Progressing   Problem: Health Behavior/Discharge Planning: Goal: Ability to manage health-related needs will improve Outcome: Progressing   Problem: RH BOWEL ELIMINATION Goal: RH STG MANAGE BOWEL WITH ASSISTANCE Description: STG Manage Bowel with mod I Assistance. Outcome: Progressing   Problem: RH BOWEL ELIMINATION Goal: RH STG MANAGE BOWEL W/MEDICATION W/ASSISTANCE Description: STG Manage Bowel with Medication with mod I Assistance. Outcome: Progressing   Problem: RH SAFETY Goal: RH STG ADHERE TO SAFETY PRECAUTIONS W/ASSISTANCE/DEVICE Description: STG Adhere to Safety Precautions With cues Assistance/Device. Outcome: Progressing   Problem: RH KNOWLEDGE DEFICIT GENERAL Goal: RH STG INCREASE KNOWLEDGE OF SELF CARE AFTER HOSPITALIZATION Description: Patient and wife will be able to manage care at discharge with educational resources independently Outcome: Progressing   Problem: Health Behavior/Discharge Planning: Goal: Ability to manage health-related needs will improve Outcome: Progressing   Problem: Health Behavior/Discharge Planning: Goal: Ability to identify and utilize available resources and services will improve Outcome: Progressing   Problem: Fluid Volume: Goal: Ability to maintain a balanced intake and output will improve Outcome: Progressing

## 2023-04-16 NOTE — Progress Notes (Signed)
Daily Progress Note   Patient Name: [name] [name]       Date: 04/16/2023 DOB: May 27, 1951  Age: 72 y.o. MRN#: [id number] Attending Physician: Angelina Sheriff, DO Primary Care Physician: Benetta Spar, MD Admit Date: 03/12/2023  Reason for Consultation/Follow-up: Establishing goals of care   Length of Stay: 35  Current Medications: Scheduled Meds:   (feeding supplement) PROSource Plus  30 mL Oral BID BM   ARIPiprazole  7.5 mg Oral QHS   aspirin EC  81 mg Oral Daily   atorvastatin  80 mg Oral Daily   Chlorhexidine Gluconate Cloth  6 each Topical Q0600   darbepoetin (ARANESP) injection - DIALYSIS  150 mcg Subcutaneous Q Sun-1800   divalproex  125 mg Oral Q8H   feeding supplement  237 mL Oral BID WC   Gerhardt's butt cream  1 Application Topical QID   lidocaine  1 patch Transdermal Q2200   lidocaine  1 patch Transdermal Q24H   memantine  5 mg Oral Daily   mometasone-formoterol  2 puff Inhalation BID   pantoprazole  40 mg Oral BID   traZODone  50 mg Oral QHS    Continuous Infusions:   PRN Meds: acetaminophen, bisacodyl, guaiFENesin-dextromethorphan, LORazepam **OR** LORazepam, Muscle Rub, ondansetron **OR** ondansetron (ZOFRAN) IV, oxyCODONE, traZODone  Physical Exam Vitals reviewed.  Constitutional:      General: He is sleeping.  Cardiovascular:     Rate and Rhythm: Normal rate.  Pulmonary:     Effort: Pulmonary effort is normal.  Neurological:     Mental Status: He is easily aroused.  Psychiatric:        Mood and Affect: Mood normal.        Behavior: Behavior normal.             Vital Signs: BP 138/81 (BP Location: Right Arm)   Pulse 73   Temp 98.7 F (37.1 C)   Resp 20   Ht 5\' 4"  (1.626 m)   Wt 90.6 kg   SpO2 100%   BMI 34.28 kg/m  SpO2: SpO2:  100 % O2 Device: O2 Device: Room Air O2 Flow Rate: O2 Flow Rate (L/min): 2 L/min   Patient Active Problem List   Diagnosis Date Noted   Agitation 04/13/2023   Hematuria 04/13/2023   Acute metabolic encephalopathy 03/12/2023  HFrEF (heart failure with reduced ejection fraction) (HCC) 03/12/2023   Morbid obesity (HCC) 03/12/2023   Encephalopathy 03/12/2023   Epistaxis 03/12/2023   PAF (paroxysmal atrial fibrillation) (HCC) 03/12/2023   Long term (current) use of insulin (HCC) 03/12/2023   ESRD on dialysis (HCC) 03/12/2023   Debility 03/12/2023   Acute encephalopathy 02/15/2023   Gait abnormality 09/11/2022   Tremor 09/11/2022   Memory loss 09/11/2022   Hypertension associated with diabetes (HCC) 06/15/2022   Heart block AV second degree 06/15/2022   Peritoneal dialysis catheter in place [facility]) 06/14/2022   End stage renal disease on dialysis (HCC) 06/14/2022   Hyperglycemia 06/12/2022   AV block 06/07/2022   S/P CABG x 2 06/05/2022   Second degree AV block, Mobitz type I 12/27/2021   Unstable angina (HCC)    NSTEMI (non-ST elevated myocardial infarction) (HCC)    Rectal bleeding 07/13/2021   Allergic rhinitis 04/07/2020   Asthma 10/02/2019   Pain in left foot 12/18/2018   Chest pain 08/29/2018   Constipation 02/28/2018   ESRD (end stage renal disease) (HCC) 11/13/2017   Foul smelling urine 12/14/2016   Coronary artery disease involving native coronary artery of native heart without angina pectoris 10/27/2016   Chronic kidney disease, stage IV (severe) (HCC) 10/27/2016   Morbid obesity due to excess calories (HCC) 03/22/2016   CAD (coronary artery disease)    Erectile dysfunction 08/16/2015   Hemorrhoids 07/05/2015   Vitamin D deficiency 06/04/2015   OSA on CPAP 05/14/2015   Normocytic anemia 03/24/2015   Fatty liver 12/01/2014   Back pain 06/05/2013   OA (osteoarthritis) of knee 03/15/2011   CTS (carpal tunnel syndrome) 03/15/2011   Type 2 diabetes mellitus with  chronic kidney disease on chronic dialysis, with long-term current use of insulin (HCC) 09/29/2008   Hyperlipidemia 09/29/2008   Essential hypertension 09/29/2008   GERD 09/29/2008    Palliative Care Assessment & Plan   Patient Profile: 72 y.o. male  with past medical history of  CAD, PUD, DM, OAS, HLD, ESRD who was on PD which was removed on 02/19/23, LUE AVF in place, IR was consulted for image guided tunneled HD cath placement.    Patient was admitted to [facility] on 02/15/23 after presenting to [facility] ED with acute metabolic encephalopathy assumed due to recent Covid infection, PD catheter malfunctioning, later found to have sepsis secondary to peritoneal dialysis associated catheter peritonitis and polymicrobial bacteremia/fungemia, s/p PD removal on 02/19/23. Hospital course complicated by worsening encephalopathy with hypoxemia.    Patient had LUE AVF in place, he underwent fistulogram with balloon venoplasty on 8/14 by Dr. Karin Lieu, the fistula was used for HD treatment on 8/16. Patient was discharged to [facility] on 8/16, he was admitted to CIR on 03/12/23. In CIR, patient continued to be confused and agitated, psychiatry is following. Patient has been disruptive during HD at times, per nephrology note he was too combative for HD on 04/07/23, agitation appears to be made worse with cannulation of AVF.    Assessment: Patient is sitting in his wheelchair in his room. He seems less agitated than yesterday but has staff at bedside.  Worked with nephrology team to allow patient's wife to be with him tomorrow for dialysis in the dialysis suite. Hope is he will tolerate a complete treatment without agitation. If he can do this he may be appropriate for outpatient dialysis.  PMT will continue to support.   1500: Called patient's wife to discuss the plan for tomorrow. She is relieved to  be able to try this and see if he can tolerate dialysis without issue with her there to support him.    Recommendations/Plan: DNR Limited scope of treatment- no intubation Time for outcomes-- hoping to find a way to continue dialysis Spiritual care consult- for supporting patient's wife Continued PMT support   Code Status:    Code Status Orders  (From admission, onward)           Start     Ordered   04/08/23 1629  Do not attempt resuscitation (DNR)- Limited -Do Not Intubate (DNI)  (Code Status)  Continuous       Question Answer Comment  If pulseless and not breathing No CPR or chest compressions.   In Pre-Arrest Conditions (Patient Is Breathing and Has A Pulse) Do not intubate. Provide all appropriate non-invasive medical interventions. Avoid ICU transfer unless indicated or required.   Consent: Discussion documented in EHR or advanced directives reviewed      04/08/23 1629        Extensive chart review has been completed prior to seeing the patient including labs, vital signs, imagine, progress/consult notes, orders, medications, and available advance directive documents.  Time spent: 25 minutes  Thank you for allowing the Palliative Medicine Team to assist in the care of this patient.     Sherryll Burger, NP  Please contact Palliative Medicine Team phone at [phone] for questions and concerns.

## 2023-04-16 NOTE — Progress Notes (Signed)
PROGRESS NOTE   Subjective/Complaints:  No events overnight. Remains waxing and waning aggitation and cogntiion.  Vitals stable. Labs 9/28 stable. Eating 75-90% meals.  Last BM 9/29  Per nursing, had witnessed fall this AM, slid down chair onti his buttocks. He denies any pain, remembers incident. He is awake, alert, and pleasant today. Remains confused but overall oriented.    XBJ:[id number] due to cognition/behavioral   Objective:   No results found. Recent Labs    04/14/23 1152  WBC 4.3  HGB 10.3*  HCT 35.1*  PLT 242      Recent Labs    04/14/23 1152  NA 133*  K 4.7  CL 94*  CO2 23  GLUCOSE 154*  BUN 50*  CREATININE 12.62*  CALCIUM 9.3       Intake/Output Summary (Last 24 hours) at 04/16/2023 0738 Last data filed at 04/15/2023 1315 Gross per 24 hour  Intake 480 ml  Output --  Net 480 ml          Physical Exam: Vital Signs Blood pressure 126/66, pulse 84, temperature 98.2 F (36.8 C), resp. rate 18, height 5\' 4"  (1.626 m), weight 90.6 kg, SpO2 97%.   General: No acute distress. Sitting up at nurses station.  Mood and affect are appropriate, mildly flat Heart: Regular rate and rhythm no rubs murmurs or extra sounds Lungs: Clear to auscultation, breathing unlabored, no rales or wheezes Abdomen: Positive bowel sounds, soft nontender to palpation, nondistended Extremities: No clubbing, cyanosis, or edema Skin: No evidence of breakdown, no evidence of rash Neurologic: AAO to self, hospital, and time. + Cognitive deficits, difficulty with recent memory.  Cranial nerves II through XII intact, motor strength is 5-/5 in bilateral deltoid, bicep, tricep, grip, hip flexor, knee extensors, ankle dorsiflexor and plantar flexor Sensory exam normal sensation to light touch and proprioception in bilateral upper and lower extremities Cerebellar exam normal  Musculoskeletal: no pain with range of motion in all  4 extremities. No joint swelling       Assessment/Plan: 1. Functional deficits which require 15 hr per week of interdisciplinary therapy in a comprehensive inpatient rehab setting. Physiatrist is providing close team supervision and 24 hour management of active medical problems listed below. Physiatrist and rehab team continue to assess barriers to discharge/monitor patient progress toward functional and medical goals  Care Tool:  Bathing    Body parts bathed by patient: Right arm, Left arm, Chest, Abdomen, Front perineal area, Right upper leg, Buttocks, Left upper leg, Right lower leg, Left lower leg, Face         Bathing assist Assist Level: Moderate Assistance - Patient 50 - 74%     Upper Body Dressing/Undressing Upper body dressing   What is the patient wearing?: Pull over shirt    Upper body assist Assist Level: Minimal Assistance - Patient > 75%    Lower Body Dressing/Undressing      Lower body dressing      What is the patient wearing?: Underwear/pull up, Pants     Lower body assist Assist for lower body dressing: Maximal Assistance - Patient 25 - 49%     Toileting Toileting    Toileting assist Assist  for toileting: Maximal Assistance - Patient 25 - 49%     Transfers Chair/bed transfer  Transfers assist     Chair/bed transfer assist level: Minimal Assistance - Patient > 75%     Locomotion Ambulation   Ambulation assist      Assist level: Minimal Assistance - Patient > 75% Assistive device: No Device Max distance: 175'   Walk 10 feet activity   Assist     Assist level: Contact Guard/Touching assist Assistive device: No Device   Walk 50 feet activity   Assist Walk 50 feet with 2 turns activity did not occur: Safety/medical concerns  Assist level: Minimal Assistance - Patient > 75% Assistive device: No Device    Walk 150 feet activity   Assist Walk 150 feet activity did not occur: Safety/medical concerns  Assist level:  Minimal Assistance - Patient > 75% Assistive device: No Device    Walk 10 feet on uneven surface  activity   Assist Walk 10 feet on uneven surfaces activity did not occur: Safety/medical concerns         Wheelchair     Assist Is the patient using a wheelchair?: Yes Type of Wheelchair: Manual    Wheelchair assist level: Dependent - Patient 0%      Wheelchair 50 feet with 2 turns activity    Assist        Assist Level: Dependent - Patient 0%   Wheelchair 150 feet activity     Assist      Assist Level: Dependent - Patient 0%   Blood pressure 126/66, pulse 84, temperature 98.2 F (36.8 C), resp. rate 18, height 5\' 4"  (1.626 m), weight 90.6 kg, SpO2 97%.  Medical Problem List and Plan: 1. Functional deficits secondary to metabolic encephalopathy due to sepsis secondary to catheter peritonitis and hypoxemia              -patient may not shower             -ELOS/Goals: 16-18 days, PT/OT Sup, SLP min A -tentative discharge date 9-9           -Continue CIR therapies including PT, OT, and SLP   -Will review records from [facility] -- has worsened since approximately 8/22, possible 8/19, consistent with records from nursing at [facility] with current behavior.   -Therapy QD schedule  -Therapy suggest having patient set up for short time before his therapy sessions to help him wake up.  I think this would be a good idea  Con't CIR PT, OT and SLP  Patient was seen by palliative care, changed to DNR status  -SNF vs Home discharge  - 9/30: Discussed with Nephrology regarding setup so wife can be at bedside during dialysis; if able to keep him calm, could accommodate as OP. If unable to keep him calm, may need to consider hospice care.    2.  Antithrombotics: -DVT/anticoagulation:  Pharmaceutical: Heparin, 03/23/23 changed to Lovenox  - Dced d/t ?; will hold off d/t recurrent falls             -antiplatelet therapy: aspirin and Plavix   3. Pain Management: Tylenol  as needed             -Lidoderm patches to back  -Elavil 25 mg at bedtime for nerve pain and sleep 8/27  -8/29: Pain well controlled on Elavil; DC lyrica, PRN Tylenol/oxy 2.5-5 mg d/t CKD  8-30: Appears patient has been getting pain medications together overnight along with antipsychotics, will discuss  with nursing  8-31: Patient having an difficulty endorsing needs and not utilizing PRNs, scheduled oxycodone 5 mg 3 times daily and has 2.5 mg prn 3 times daily available.   9-1: Not utilizing PRNs, minimal complaints of pain, will discontinue scheduled given lethargy today..  -muscle rub R shoulder  9/16 patient reports pain is under control overall   9/26-30 no report of pain noted  4. Mood/Behavior/Sleep: LCSW to evaluate and provide emotional support             -continue Seroquel 50 mg TID             -clonazepam 0.5 mg TID prn             -Geodon 10 mg IM q 12 hours prn             -antipsychotic agents: n/a  -8-27: Agitated by soft restraints in bed, transition to 4 side rails, Posey alarm, and TeleSitter for now.  Veil bed on standby if needed, however feel we should avoid this if possible due to likelihood of agitating patient further   - 8/28: Placed in veil bed; wrist restraints for dialysis. Scheduled Geodon 20 mg twice daily; discontinue Seroquel and clonazepam due to no perceivable benefit. Add as needed Zyprexa 5 mg twice daily p.o. or IM due to recent cardiac arrhythmias and concern for QTc prolongation. As needed ramelteon for sleep. Stop gabapentin due to possible side effect of agitation  - 8/28: No improvement except temporarily with Geodon; add depakote 125 mg q8h for ongoing aggitation, mood stabilization. Increase elavil to 50 mg at bedtime.  Workup ongoing.    - 8/29: EEG negative, labs normal. MRI pending. Sleeping well and calm since starting depakote and increase in elavil. Will monitor tonight and wean Geodon in AM if appropriate.   8-30: MRI showing chronic  microvascular changes, otherwise normal.  Increasingly calm and appropriate during the day, with ongoing agitation at nighttime.  Will increase nighttime Geodon to 40 mg; will discuss sleep log with nursing, as it has not been performed.  8-31 no sleep overnight per sleep log, again getting agitated around 9 PM and 5 AM.  Not responsive to as needed Zyprexa, responsive to Ativan.  Attempted to wean off scheduled a.m. Geodon today, but patient became extremely agitated and unable to redirect, calm down with readministration of medication.  Resume Geodon 20 mg every morning/40 mg every afternoon, increased Elavil to 75 mg nightly, adding trazodone 50 mg nightly as needed.  P.o./IV Ativan 0.5 mg every 6 hours for agitation.  9-1: slept well overnight with trazodone and increased Elavil, no PRNs used.  Unfortunately, oversedated today.  Discontinue scheduled pain medications as above, start wean of Elavil to 50 mg x2 days -> 25 mg x2 days -> 10 mg x2 days, off due to no apparent benefit and no complaints of pain.  Reduce Geodon from 20 mg every morning/40 mg every evening to 20 mg twice daily.  Continue trazodone as needed for sleep, as this seems to be the difference for a restful night yesterday.  If he remains overly sedated, would dose reduce and DC Depakote next.  Keep Ativan as needed d/t dialysis not having other options available.  9/2-3--non agitated but still sleepy   -dc'ed depakote as I doubt it's doing much at this dose anyway   -continue geodon at 20mg  bid   -scheduled trazodone at HS with back up dose if needed    -sleep was intermittent last night   -  requires enclosure bed for safety  9/4 continue enclosure bed for now, consider trying DC of this device early next week  -9/5 appears to be doing better overall this morning, continues to have intermittent agitation at times.  Continue enclosure bed  -9/6 agitation appears to be improving, patient is skeptical about medications and has been  declining some of these.  Will discontinue multivitamin, RisaQuad to try to decrease pill burden  -9/7 continues to have intermittent episodes of agitation however overall improving, appears to be taking most of his medications  9/9 agitation and mental status appears to be improving -discussing discontinuation of enclosure bed with team 9/10 Geodon dose increased to 40 mg at night, continue 20 mg in the morning 9/11 No acute events noted, will check with team regarding DC enclosure bed, nursing reports he did ok last night but had dialysis 9/12 continues to have sundowning/waxing waning confusion and agitation, continue enclosure bed for now, will caution with Geodon will continue current dose but hold off on any escalation as this can contribute to bradycardia 9/16 no longer using enclosure bed, continue Geodon 20 mg twice daily for now due to intermittent agitation 9/17 patient with agitation and confusion at night, has sedation during the day.  Discussed with psychiatry Dr. Gasper Sells.  Will discontinue Geodon and try Abilify tonight.  Dr. Gasper Sells also suggested considering donepezil to shorten hallucinations of delirium.  Psychiatry consult placed, will see tomorrow.  Appreciate assistance 9/18 no behavioral related notes overnight, psychiatry plans to see him today.  Appreciate assistance 9/19 patient was seen by psychiatry yesterday, recommended continue Abilify, consider donepezil or memantine, suggested avoiding stimulants.  Will continue current regimen for now as it appears he is doing better.  He appears to be less sedated and did not see frequent notes of agitation but this regimen thus far. 9/20 recheck CMP/ammonia/VPA level today due to continued drowsiness  9/21- Depakote level 16- too low- Ammonia 19- low- asleep this AM, but haven't been called today about sedation/lethargy.  9/22- more hallucinations per nursing- labs attempted- couldn't get- couldn't get U/A results since so much  hematuria. Trying to get labs, to see if getting ill vs missing HD? 9/23 will go back to 3 times daily for Depakote to help avoid agitation 9/24 continue current regimen with Abilify and start memantine 5 mg daily 9/27 increase Abilify to 7.5 mg daily to help reduce agitation 9/30: No reported events, continue current regimen  5. Neuropsych/cognition: This patient is not capable of making decisions on his own behalf   -recommend neuropsych consult   - 8/27: Ongoing aggitation; Consult hospitalist for further workup on potential contributors to encephalopathy. Pending UA, LA 2.1-> 1.4; otherwise negative so far -Delirium precautions, sleep log, Veiled bed for safety and to minimize restraints -Wife has expressed that she is not interested in palliative care at this time; hopefully with above measures patient can get dialysis in the next 48 hours to limit worsening uremic encephalopathy and medical decline    - 8/29: Aggitation much improved, able to get some dialysis last night and today. See above for medication changes. Calling wife tonight to discuss.   8-30: Still with moderate cognitive deficits, however appears once patient is less agitated he is better able to concentrate and has better orientation.  MRI negative for acute findings, monitor.  9/3 agitation better. More sedated than anything at this point. Confused as well.    -depakote stopped   -continue other meds as rx'ed  9/21- pt refused  Depakote this AM "since doesn't have seizures".   9/30: compliant with depakote over weekend; calm, alert, oriented; continue current regimen   6. Skin/Wound Care: Routine skin care checks             -sacral skin off-loading/protection   7. Fluids/Electrolytes/Nutrition: Strict Is and Os and follow-up chemistries per nephrology             -renal diet/ 1200 cc FR  - Renvela 800 mg 3 times daily for hyperphosphatemia added per nephrology 8-27   9/21- Last Cr 7.07 and BUN 22 8: Hypotension:  monitor TID and prn             -continue midodrine as needed during dialysis  Normotensive, monitor  9-1: Consistently 90s over 60s, has been stable but given findings of microvascular changes on MRI, may be too low for patient to perfuse properly.  Will add midodrine 5 mg tid and monitor.  DC Coreg as heart rate has been low to normal; may resume at low-dose once blood pressure improved.   9/2 continue with ccurrent regimen. Bp's hr remain low   -elavil wean should help  9/9-10 BP stable, continue midodrine and monitor  9/13 DC midodrine today as BP has been higher, can restart if needed  Improved, cont to monitor  9/27-9/30 BP stable overall    04/16/2023    6:08 AM 04/15/2023    1:05 PM 04/15/2023    5:00 AM  Vitals with BMI  Weight   199 lbs 12 oz  Systolic 126 126   Diastolic 66 88   Pulse 84 83       9: Hyperlipidemia: continue statin   10: CAD s/p CABG 2023: on DAPT and statin.  - troponin elevated but stable, monitor 8/27   11: DM-2: A1c = 6.3% (at home on Lantus 22 units at bedtime, Tradjenta 5 mg daily)        -monitor PO intake             -continue SSI (0-6 units) does not appear to be requiring coverage   - 9/6 DC SSI, change CBGs to daily  9/8 discontinue CBGs  9/16 glucose still controlled on BMP yesterday   9/23 glucose 100 on BMP yesterday  12: ESRD: HD on Tues, Thurs, Sat -nephrology made aware   - Unable to tolerate 8/27 d/t aggitation, stopped early 8/28;  - attempting again 8/29, stopped early again -Per nephrology, redirectable with gum -Have to switch restraints during dialysis sessions from veil bed to regular bed, then resume veil bed order once at rehab -9/9 tolerating HD better 9/18 patient's wife indicates she would be able to stay with him during dialysis after discharge   9/22- pt refused/too agitated ot get HD yesterday  9/24 nephrology does not feel like he is a candidate for outpatient dialysis at this time due to agitation  9/27 will  attempt hemodialysis in the mornings, premedicated to help reduce risk of agitation  9/30: See above, will try wife at bedside with dialysis   13: COPD/OSA; chronic home 2L O2 (Symbicort, Ventolin, prednisone 40 mg daily at home)             -continue symbicort  -Added home oxygen back 8-27   14: GERD: continue Protonix   15: Non-sustained VT/pAF/bradycardia: per cardiology, Dr. Algie Coffer: -continue carvedilol 6.25 mg BID; consider increasing to 12.5 mg BID before starting low dose amiodarone  -consider Eliquis and discontinue asa and Plavix in one week if  no active GI bleeding and stable hemoglobin from 8/24 -hemoglobin has remained stable but very low around 7, no recurrent A-fib, monitor for now given agitation/fall 8-31 and consider initiating Eliquis when safe -Continue mag-ox to avoid hypomagnesemia -8-27: Single instance of bradycardia this morning 40s, may have been missed read, monitor through today and do not further adjust beta-blocker medications  -9-3 hr controlled after holding coreg -9/4-5 HR stable, continue to monitor -9/9 consider Eliquis this week if agitation continues to improve -9/10 will hold off on Eliquis due to sundowning at night, will see how he does with increase Geodon -9/11 EKG for bradycardia episode today, 2nd degree A-V block mobitz type 1.  Continue to hold beta blocker,9/11 will restart eliquis at 2.5 mg BID dose. Will consult cardiology regarding need for ASA/plavix and coreg.  Dr. Elease Hashimoto does not appear convinced that he has A-fib 9/12 appreciate cardiology consult yesterday, DAPT transition to daily aspirin 81 only and Eliquis was discontinued, caution with Geodon as this can contribute to bradycardia 9/13 will decrease Geodon nighttime dose to 20 due to episodes of bradycardia 9/17 will discontinue Geodon due to variable cardiac effects 9/24 bradycardia continues to be improved after Geodon discontinued 9/30: recurrent fall; no current dvt ppx      04/16/2023    6:08 AM 04/15/2023    1:05 PM 04/15/2023    5:00 AM  Vitals with BMI  Weight   199 lbs 12 oz  Systolic 126 126   Diastolic 66 88   Pulse 84 83        16: HFrEF: Daily weights. Monitor for signs of overload  - 9/7 continue daily weights, fluid level management per hemodialysis team Weight reviewed and stable  9/21- 94.3- down 3 kg-   9/22- no weight for today- too agitated per nursing 9/30: weights downtrending; good POs; monitor Filed Weights   04/14/23 0500 04/14/23 1213 04/15/23 0500  Weight: 95.8 kg 92.2 kg 90.6 kg      17: Liver nodularity on CT scan: LFTs reported to be normal. Avoid hepatotoxic medications    -Abdominal ultrasound earlier this admission with minimal ascites; ammonia within normal limits  -8/31: Did discuss with nephrology whether p.o. lactulose would be beneficial despite normal ammonia, after reviewing records unsure that this would be beneficial    18: Epistaxis - resolved             -previously improved with flonase and saline spray   19: Morbid Obesity. Dietary counseling Body mass index is 39.01 kg/m.  20. Bradycardic: continue to monitor HR TID  -Discontinue Geodon  -9/19 heart rate in the 80s, stable  HR stable  21. HTN: resolved after HD, continue to monitor TID  9/19-20 intermittently elevated, continue to monitor.  Cautious with medication addition to avoid hypotension  9/27-30 BP controlled   22. Hematuria  9/22- required in/out to check U/A- having new hematuria- nursing to keep an eye- since also bleeding from penis a little- monitor   9/23 no wBCs  on UA, follow  urine culture-Staphylococcus hemolyticus-discussed with pharmacy and ID pharmacy.  Think this is more likely contaminant  9/25 discuss with nephrology about possible treatment or other evaluation options  Spoke with Dr. Ronalee Belts, most likely contaminant.  Recommended treating only if symptomatic.  If hematuria continues consider urology input  9/27 urine has  improved to clear yellow  LOS: 35 days A FACE TO FACE EVALUATION WAS PERFORMED  Angelina Sheriff 04/16/2023, 7:38 AM

## 2023-04-17 DIAGNOSIS — Z992 Dependence on renal dialysis: Secondary | ICD-10-CM | POA: Diagnosis not present

## 2023-04-17 DIAGNOSIS — Z7189 Other specified counseling: Secondary | ICD-10-CM | POA: Diagnosis not present

## 2023-04-17 DIAGNOSIS — Z515 Encounter for palliative care: Secondary | ICD-10-CM | POA: Diagnosis not present

## 2023-04-17 DIAGNOSIS — D631 Anemia in chronic kidney disease: Secondary | ICD-10-CM | POA: Diagnosis not present

## 2023-04-17 DIAGNOSIS — N186 End stage renal disease: Secondary | ICD-10-CM | POA: Diagnosis not present

## 2023-04-17 DIAGNOSIS — N250 Renal osteodystrophy: Secondary | ICD-10-CM | POA: Diagnosis not present

## 2023-04-17 DIAGNOSIS — Z1159 Encounter for screening for other viral diseases: Secondary | ICD-10-CM | POA: Diagnosis not present

## 2023-04-17 DIAGNOSIS — Z23 Encounter for immunization: Secondary | ICD-10-CM | POA: Diagnosis not present

## 2023-04-17 LAB — GLUCOSE, CAPILLARY: Glucose-Capillary: 104 mg/dL — ABNORMAL HIGH (ref 70–99)

## 2023-04-17 MED ORDER — LORAZEPAM 2 MG/ML IJ SOLN
0.5000 mg | Freq: Once | INTRAMUSCULAR | Status: AC
  Administered 2023-04-17: 0.5 mg via INTRAVENOUS

## 2023-04-17 NOTE — Progress Notes (Signed)
Occupational Therapy Session Note  Patient Details  Name: ZINEDINE ELLNER MRN: [id number] Date of Birth: 1950-07-29  Today's Date: 04/17/2023 OT Individual Time: 4098-1191 OT Individual Time Calculation (min): 23 min    Short Term Goals: Week 5:  OT Short Term Goal 1 (Week 5): Working to consistently meet LTG  Skilled Therapeutic Interventions/Progress Updates:    Pt greeted at nurses station falling asleep and falling forward in wc. Handoff to OT. OT brought pt into the apartment and tried to get pt to engage in utensil sorting activity. OT handed pt utensils and he would just fall asleep. Attempted to get pt into standing. Pt would wake enough to try to push up, but then stop and fall back asleep. Despite max multimodal cues, pt unable to maintain alertness.Pt brought back to room and needed mod A to initiate stand and transfer back to bed. Pt started falling asleep seated EOB and falling forward requiring max A from OT to return safely to bed. Pt left semi-reclined in bed with bed alarm on and needs met.  Therapy Documentation Precautions:  Precautions Precautions: Fall Restrictions Weight Bearing Restrictions: No Pain:  Denies pain   Therapy/Group: Individual Therapy  [name] 04/17/2023, 10:18 AM

## 2023-04-17 NOTE — Progress Notes (Signed)
Patient ID: [name] [name], male   DOB: 03-Aug-1950, 72 y.o.   MRN: [id number]  Team Conference Report to Patient/Family  Team Conference discussion was reviewed with the patient and caregiver, including goals, any changes in plan of care and target discharge date.  Patient and caregiver express understanding and are in agreement.  The patient has a target discharge date of  (D/C pending).   Covering for primary SW, Auria.  Sw spoke with pt spouse, [name]. Sw informed spouse that once HD trial was completed this will provide the team a better idea of discharging planning. Sw informed spouse that SW will follow up with her to discuss plans once a decision is determined. No additional questions or concerns. Andria Rhein 04/17/2023, 1:28 PM

## 2023-04-17 NOTE — Progress Notes (Addendum)
This RN called HD to clarify when wife is to be by side. HD states patient will have HD at bedside Thursday 10/3 to confirm patient behavior will be appropriate. Patient will go to HD today per usual

## 2023-04-17 NOTE — Progress Notes (Signed)
Speech Language Pathology Weekly Progress Note  Patient Details  Name: [name] [name] MRN: [id number] Date of Birth: 12-09-1950  Beginning of progress report period: April 10, 2023 End of progress report period: April 17, 2023   Short Term Goals: Week 5: SLP Short Term Goal 1 (Week 5): Patient will utilize external aids to orient to time and situation given modA SLP Short Term Goal 1 - Progress (Week 5): Not met SLP Short Term Goal 2 (Week 5): Pt will complete functional problem solving with 50% acc with max A. SLP Short Term Goal 2 - Progress (Week 5): Met SLP Short Term Goal 3 (Week 5): Pt will attend to functional task for 5-7 minutes given mod multimodal cues. SLP Short Term Goal 3 - Progress (Week 5): Not met SLP Short Term Goal 4 (Week 5): Pt will answer biographical questions via multimodal communication accurately with 75% acc with mod A SLP Short Term Goal 4 - Progress (Week 5): Not met    New Short Term Goals: Week 6: SLP Short Term Goal 1 (Week 6): Patient will utilize external aids to orient to time and situation given modA SLP Short Term Goal 2 (Week 6): Pt will complete functional problem solving with 60% acc with max A. SLP Short Term Goal 3 (Week 6): Pt will attend to functional task for 5-7 minutes given mod multimodal cues. SLP Short Term Goal 4 (Week 6): Pt will answer biographical questions via multimodal communication accurately with 75% acc with mod A  Weekly Progress Updates: Patient continues to make slow and limited gains and has met 1 of 4 STGs this reporting period. Patient's overall cognitive functioning is impacted by fluctuating levels of arousal and verbal frustration. Overall, patient requires overall Max A multimodal cues to complete functional and familiar tasks safely in regards to sustained attention, initiation, orientation with use of external aids and problem solving. Patient and family education ongoing. Patient would benefit from continued  skilled SLP intervention to maximize his cognitive functioning in order to reduce caregiver burden prior to discharge.     Intensity: Minumum of 1-2 x/day, 30 to 90 minutes Frequency: 1 to 3 out of 7 days Duration/Length of Stay: TBD d/t possible SNF placement Treatment/Interventions: Cognitive remediation/compensation;Environmental controls;Cueing hierarchy;Functional tasks;Therapeutic Activities;Internal/external aids;Patient/family education     Reya Aurich 04/17/2023, 11:16 AM

## 2023-04-17 NOTE — Progress Notes (Signed)
PROGRESS NOTE   Subjective/Complaints:  Aggitated overnight, refused PM medications. Had PRN ativan PO lat night and again IV this AM at 2 am. Waxes and wanes with therapies depending on fatigue and the day; Per SLP he is either lethargic or aggitated without much in between.  On evaluation, patient is resting in bed, is able to wake up and provide orientation answers to questions but soon after falls back asleep.  No focal deficits noted.  Per nephrology, unit unable to accommodate wife due to concerns with patient privacy, will try to arrange next dialysis session in room.    PXT:GGYIRSW due to cognition/behavioral   Objective:   No results found. Recent Labs    04/14/23 1152  WBC 4.3  HGB 10.3*  HCT 35.1*  PLT 242      Recent Labs    04/14/23 1152  NA 133*  K 4.7  CL 94*  CO2 23  GLUCOSE 154*  BUN 50*  CREATININE 12.62*  CALCIUM 9.3      No intake or output data in the 24 hours ending 04/17/23 0936         Physical Exam: Vital Signs Blood pressure (!) 156/78, pulse (!) 101, temperature 98.5 F (36.9 C), resp. rate 16, height 5\' 4"  (1.626 m), weight 90.6 kg, SpO2 96%.   General: No acute distress.  Laying in bed. Mood and affect are appropriat Heart: Regular rate and rhythm no rubs murmurs or extra sounds Lungs: Clear to auscultation, breathing unlabored, no rales or wheezes Abdomen: Positive bowel sounds, soft nontender to palpation, nondistended Extremities: No clubbing, cyanosis, or edema Skin: No evidence of breakdown, no evidence of rash Neurologic: AAO to self, hospital, and time. + Cognitive deficits, difficulty with recent memory.  Lethargic today. Cranial nerves II through XII intact, motor strength is moving all 4 extremities in bed, prior 5- out of 5 throughout  Response to light touch in all 4 extremities Musculoskeletal: no pain with range of motion in all 4 extremities. No joint  swelling       Assessment/Plan: 1. Functional deficits which require 15 hr per week of interdisciplinary therapy in a comprehensive inpatient rehab setting. Physiatrist is providing close team supervision and 24 hour management of active medical problems listed below. Physiatrist and rehab team continue to assess barriers to discharge/monitor patient progress toward functional and medical goals  Care Tool:  Bathing    Body parts bathed by patient: Right arm, Left arm, Chest, Abdomen, Front perineal area, Right upper leg, Buttocks, Left upper leg, Right lower leg, Left lower leg, Face         Bathing assist Assist Level: Moderate Assistance - Patient 50 - 74%     Upper Body Dressing/Undressing Upper body dressing   What is the patient wearing?: Pull over shirt    Upper body assist Assist Level: Minimal Assistance - Patient > 75%    Lower Body Dressing/Undressing      Lower body dressing      What is the patient wearing?: Underwear/pull up, Pants     Lower body assist Assist for lower body dressing: Maximal Assistance - Patient 25 - 49%     Toileting  Toileting    Toileting assist Assist for toileting: Maximal Assistance - Patient 25 - 49%     Transfers Chair/bed transfer  Transfers assist     Chair/bed transfer assist level: Minimal Assistance - Patient > 75%     Locomotion Ambulation   Ambulation assist      Assist level: Minimal Assistance - Patient > 75% Assistive device: No Device Max distance: 175'   Walk 10 feet activity   Assist     Assist level: Contact Guard/Touching assist Assistive device: No Device   Walk 50 feet activity   Assist Walk 50 feet with 2 turns activity did not occur: Safety/medical concerns  Assist level: Minimal Assistance - Patient > 75% Assistive device: No Device    Walk 150 feet activity   Assist Walk 150 feet activity did not occur: Safety/medical concerns  Assist level: Minimal Assistance -  Patient > 75% Assistive device: No Device    Walk 10 feet on uneven surface  activity   Assist Walk 10 feet on uneven surfaces activity did not occur: Safety/medical concerns         Wheelchair     Assist Is the patient using a wheelchair?: Yes Type of Wheelchair: Manual    Wheelchair assist level: Dependent - Patient 0%      Wheelchair 50 feet with 2 turns activity    Assist        Assist Level: Dependent - Patient 0%   Wheelchair 150 feet activity     Assist      Assist Level: Dependent - Patient 0%   Blood pressure (!) 156/78, pulse (!) 101, temperature 98.5 F (36.9 C), resp. rate 16, height 5\' 4"  (1.626 m), weight 90.6 kg, SpO2 96%.  Medical Problem List and Plan: 1. Functional deficits secondary to metabolic encephalopathy due to sepsis secondary to catheter peritonitis and hypoxemia              -patient may not shower             -ELOS/Goals: 16-18 days, PT/OT Sup, SLP min A -tentative discharge date 9-9           -Continue CIR therapies including PT, OT, and SLP   -Will review records from [facility] -- has worsened since approximately 8/22, possible 8/19, consistent with records from nursing at [facility] with current behavior.   -Therapy QD schedule  -Therapy suggest having patient set up for short time before his therapy sessions to help him wake up.  I think this would be a good idea  Con't CIR PT, OT and SLP  Patient was seen by palliative care, changed to DNR status  -SNF vs Home discharge  - 9/30: Discussed with Nephrology regarding setup so wife can be at bedside during dialysis; if able to keep him calm, could accommodate as OP. If unable to keep him calm, may need to consider hospice care.    2.  Antithrombotics: -DVT/anticoagulation:  Pharmaceutical: Heparin, 03/23/23 changed to Lovenox  - Dced d/t ?; will hold off d/t recurrent falls             -antiplatelet therapy: aspirin and Plavix   3. Pain Management: Tylenol as needed              -Lidoderm patches to back  -Elavil 25 mg at bedtime for nerve pain and sleep 8/27  -8/29: Pain well controlled on Elavil; DC lyrica, PRN Tylenol/oxy 2.5-5 mg d/t CKD  8-30: Appears patient has been getting  pain medications together overnight along with antipsychotics, will discuss with nursing  8-31: Patient having an difficulty endorsing needs and not utilizing PRNs, scheduled oxycodone 5 mg 3 times daily and has 2.5 mg prn 3 times daily available.   9-1: Not utilizing PRNs, minimal complaints of pain, will discontinue scheduled given lethargy today..  -muscle rub R shoulder  9/16 patient reports pain is under control overall   9/26-30 no report of pain noted  4. Mood/Behavior/Sleep: LCSW to evaluate and provide emotional support             -continue Seroquel 50 mg TID             -clonazepam 0.5 mg TID prn             -Geodon 10 mg IM q 12 hours prn             -antipsychotic agents: n/a  -8-27: Agitated by soft restraints in bed, transition to 4 side rails, Posey alarm, and TeleSitter for now.  Veil bed on standby if needed, however feel we should avoid this if possible due to likelihood of agitating patient further   - 8/28: Placed in veil bed; wrist restraints for dialysis. Scheduled Geodon 20 mg twice daily; discontinue Seroquel and clonazepam due to no perceivable benefit. Add as needed Zyprexa 5 mg twice daily p.o. or IM due to recent cardiac arrhythmias and concern for QTc prolongation. As needed ramelteon for sleep. Stop gabapentin due to possible side effect of agitation  - 8/28: No improvement except temporarily with Geodon; add depakote 125 mg q8h for ongoing aggitation, mood stabilization. Increase elavil to 50 mg at bedtime.  Workup ongoing.    - 8/29: EEG negative, labs normal. MRI pending. Sleeping well and calm since starting depakote and increase in elavil. Will monitor tonight and wean Geodon in AM if appropriate.   8-30: MRI showing chronic microvascular changes,  otherwise normal.  Increasingly calm and appropriate during the day, with ongoing agitation at nighttime.  Will increase nighttime Geodon to 40 mg; will discuss sleep log with nursing, as it has not been performed.  8-31 no sleep overnight per sleep log, again getting agitated around 9 PM and 5 AM.  Not responsive to as needed Zyprexa, responsive to Ativan.  Attempted to wean off scheduled a.m. Geodon today, but patient became extremely agitated and unable to redirect, calm down with readministration of medication.  Resume Geodon 20 mg every morning/40 mg every afternoon, increased Elavil to 75 mg nightly, adding trazodone 50 mg nightly as needed.  P.o./IV Ativan 0.5 mg every 6 hours for agitation.  9-1: slept well overnight with trazodone and increased Elavil, no PRNs used.  Unfortunately, oversedated today.  Discontinue scheduled pain medications as above, start wean of Elavil to 50 mg x2 days -> 25 mg x2 days -> 10 mg x2 days, off due to no apparent benefit and no complaints of pain.  Reduce Geodon from 20 mg every morning/40 mg every evening to 20 mg twice daily.  Continue trazodone as needed for sleep, as this seems to be the difference for a restful night yesterday.  If he remains overly sedated, would dose reduce and DC Depakote next.  Keep Ativan as needed d/t dialysis not having other options available.  9/2-3--non agitated but still sleepy   -dc'ed depakote as I doubt it's doing much at this dose anyway   -continue geodon at 20mg  bid   -scheduled trazodone at HS with back up dose if  needed    -sleep was intermittent last night   -requires enclosure bed for safety  9/4 continue enclosure bed for now, consider trying DC of this device early next week  -9/5 appears to be doing better overall this morning, continues to have intermittent agitation at times.  Continue enclosure bed  -9/6 agitation appears to be improving, patient is skeptical about medications and has been declining some of these.   Will discontinue multivitamin, RisaQuad to try to decrease pill burden  -9/7 continues to have intermittent episodes of agitation however overall improving, appears to be taking most of his medications  9/9 agitation and mental status appears to be improving -discussing discontinuation of enclosure bed with team 9/10 Geodon dose increased to 40 mg at night, continue 20 mg in the morning 9/11 No acute events noted, will check with team regarding DC enclosure bed, nursing reports he did ok last night but had dialysis 9/12 continues to have sundowning/waxing waning confusion and agitation, continue enclosure bed for now, will caution with Geodon will continue current dose but hold off on any escalation as this can contribute to bradycardia 9/16 no longer using enclosure bed, continue Geodon 20 mg twice daily for now due to intermittent agitation 9/17 patient with agitation and confusion at night, has sedation during the day.  Discussed with psychiatry Dr. Gasper Sells.  Will discontinue Geodon and try Abilify tonight.  Dr. Gasper Sells also suggested considering donepezil to shorten hallucinations of delirium.  Psychiatry consult placed, will see tomorrow.  Appreciate assistance 9/18 no behavioral related notes overnight, psychiatry plans to see him today.  Appreciate assistance 9/19 patient was seen by psychiatry yesterday, recommended continue Abilify, consider donepezil or memantine, suggested avoiding stimulants.  Will continue current regimen for now as it appears he is doing better.  He appears to be less sedated and did not see frequent notes of agitation but this regimen thus far. 9/20 recheck CMP/ammonia/VPA level today due to continued drowsiness  9/21- Depakote level 16- too low- Ammonia 19- low- asleep this AM, but haven't been called today about sedation/lethargy.  9/22- more hallucinations per nursing- labs attempted- couldn't get- couldn't get U/A results since so much hematuria. Trying to get  labs, to see if getting ill vs missing HD? 9/23 will go back to 3 times daily for Depakote to help avoid agitation 9/24 continue current regimen with Abilify and start memantine 5 mg daily 9/27 increase Abilify to 7.5 mg daily to help reduce agitation 9/30: No reported events, continue current regimen 10-1: Agitated overnight, got both p.o. and IV Ativan.  Likely lethargic today from this.  Monitor for neurologic changes.  5. Neuropsych/cognition: This patient is not capable of making decisions on his own behalf   -recommend neuropsych consult   - 8/27: Ongoing aggitation; Consult hospitalist for further workup on potential contributors to encephalopathy. Pending UA, LA 2.1-> 1.4; otherwise negative so far -Delirium precautions, sleep log, Veiled bed for safety and to minimize restraints -Wife has expressed that she is not interested in palliative care at this time; hopefully with above measures patient can get dialysis in the next 48 hours to limit worsening uremic encephalopathy and medical decline    - 8/29: Aggitation much improved, able to get some dialysis last night and today. See above for medication changes. Calling wife tonight to discuss.   8-30: Still with moderate cognitive deficits, however appears once patient is less agitated he is better able to concentrate and has better orientation.  MRI negative for acute findings,  monitor.  9/3 agitation better. More sedated than anything at this point. Confused as well.    -depakote stopped   -continue other meds as rx'ed  9/21- pt refused Depakote this AM "since doesn't have seizures".   9/30: compliant with depakote over weekend; calm, alert, oriented; continue current regimen  10-1: Agitated overnight, does better with wife at bedside per reports.   6. Skin/Wound Care: Routine skin care checks             -sacral skin off-loading/protection   7. Fluids/Electrolytes/Nutrition: Strict Is and Os and follow-up chemistries per nephrology              -renal diet/ 1200 cc FR  - Renvela 800 mg 3 times daily for hyperphosphatemia added per nephrology 8-27   9/21- Last Cr 7.07 and BUN 22 8: Hypotension: monitor TID and prn             -continue midodrine as needed during dialysis  Normotensive, monitor  9-1: Consistently 90s over 60s, has been stable but given findings of microvascular changes on MRI, may be too low for patient to perfuse properly.  Will add midodrine 5 mg tid and monitor.  DC Coreg as heart rate has been low to normal; may resume at low-dose once blood pressure improved.   9/2 continue with ccurrent regimen. Bp's hr remain low   -elavil wean should help  9/9-10 BP stable, continue midodrine and monitor  9/13 DC midodrine today as BP has been higher, can restart if needed  Improved, cont to monitor  9/27-9/30 BP stable overall    04/17/2023    2:54 AM 04/16/2023    7:44 PM 04/16/2023    1:11 PM  Vitals with BMI  Systolic 156 134 161  Diastolic 78 79 81  Pulse 101 92 73      9: Hyperlipidemia: continue statin   10: CAD s/p CABG 2023: on DAPT and statin.  - troponin elevated but stable, monitor 8/27   11: DM-2: A1c = 6.3% (at home on Lantus 22 units at bedtime, Tradjenta 5 mg daily)        -monitor PO intake             -continue SSI (0-6 units) does not appear to be requiring coverage   - 9/6 DC SSI, change CBGs to daily  9/8 discontinue CBGs  9/16 glucose still controlled on BMP yesterday   9/23 glucose 100 on BMP yesterday   -  Recent Labs    04/17/23 0251  GLUCAP 104*     12: ESRD: HD on Tues, Thurs, Sat -nephrology made aware   - Unable to tolerate 8/27 d/t aggitation, stopped early 8/28;  - attempting again 8/29, stopped early again -Per nephrology, redirectable with gum -Have to switch restraints during dialysis sessions from veil bed to regular bed, then resume veil bed order once at rehab -9/9 tolerating HD better 9/18 patient's wife indicates she would be able to stay with him during  dialysis after discharge   9/22- pt refused/too agitated ot get HD yesterday  9/24 nephrology does not feel like he is a candidate for outpatient dialysis at this time due to agitation  9/27 will attempt hemodialysis in the mornings, premedicated to help reduce risk of agitation  9/30: See above, will try wife at bedside with dialysis   13: COPD/OSA; chronic home 2L O2 (Symbicort, Ventolin, prednisone 40 mg daily at home)             -  continue symbicort  -Added home oxygen back 8-27   14: GERD: continue Protonix   15: Non-sustained VT/pAF/bradycardia: per cardiology, Dr. Algie Coffer: -continue carvedilol 6.25 mg BID; consider increasing to 12.5 mg BID before starting low dose amiodarone  -consider Eliquis and discontinue asa and Plavix in one week if no active GI bleeding and stable hemoglobin from 8/24 -hemoglobin has remained stable but very low around 7, no recurrent A-fib, monitor for now given agitation/fall 8-31 and consider initiating Eliquis when safe -Continue mag-ox to avoid hypomagnesemia -8-27: Single instance of bradycardia this morning 40s, may have been missed read, monitor through today and do not further adjust beta-blocker medications  -9-3 hr controlled after holding coreg -9/4-5 HR stable, continue to monitor -9/9 consider Eliquis this week if agitation continues to improve -9/10 will hold off on Eliquis due to sundowning at night, will see how he does with increase Geodon -9/11 EKG for bradycardia episode today, 2nd degree A-V block mobitz type 1.  Continue to hold beta blocker,9/11 will restart eliquis at 2.5 mg BID dose. Will consult cardiology regarding need for ASA/plavix and coreg.  Dr. Elease Hashimoto does not appear convinced that he has A-fib 9/12 appreciate cardiology consult yesterday, DAPT transition to daily aspirin 81 only and Eliquis was discontinued, caution with Geodon as this can contribute to bradycardia 9/13 will decrease Geodon nighttime dose to 20 due to episodes  of bradycardia 9/17 will discontinue Geodon due to variable cardiac effects 9/24 bradycardia continues to be improved after Geodon discontinued 9/30: recurrent fall; no current dvt ppx     04/17/2023    2:54 AM 04/16/2023    7:44 PM 04/16/2023    1:11 PM  Vitals with BMI  Systolic 156 134 657  Diastolic 78 79 81  Pulse 101 92 73       16: HFrEF: Daily weights. Monitor for signs of overload  - 9/7 continue daily weights, fluid level management per hemodialysis team Weight reviewed and stable  9/21- 94.3- down 3 kg-   9/22- no weight for today- too agitated per nursing 9/30: weights downtrending; good POs; monitor Filed Weights   04/14/23 0500 04/14/23 1213 04/15/23 0500  Weight: 95.8 kg 92.2 kg 90.6 kg      17: Liver nodularity on CT scan: LFTs reported to be normal. Avoid hepatotoxic medications    -Abdominal ultrasound earlier this admission with minimal ascites; ammonia within normal limits  -8/31: Did discuss with nephrology whether p.o. lactulose would be beneficial despite normal ammonia, after reviewing records unsure that this would be beneficial    18: Epistaxis - resolved             -previously improved with flonase and saline spray   19: Morbid Obesity. Dietary counseling Body mass index is 39.01 kg/m.  20. Bradycardic: continue to monitor HR TID  -Discontinue Geodon  -9/19 heart rate in the 80s, stable  HR stable  21. HTN: resolved after HD, continue to monitor TID  9/19-20 intermittently elevated, continue to monitor.  Cautious with medication addition to avoid hypotension  9/27-30 BP controlled   22. Hematuria - resolved  9/22- required in/out to check U/A- having new hematuria- nursing to keep an eye- since also bleeding from penis a little- monitor   9/23 no wBCs  on UA, follow  urine culture-Staphylococcus hemolyticus-discussed with pharmacy and ID pharmacy.  Think this is more likely contaminant  9/25 discuss with nephrology about possible treatment  or other evaluation options  Spoke with Dr. Ronalee Belts, most likely  contaminant.  Recommended treating only if symptomatic.  If hematuria continues consider urology input  9/27 urine has improved to clear yellow -   LOS: 36 days A FACE TO FACE EVALUATION WAS PERFORMED  [name] [name] 04/17/2023, 9:36 AM

## 2023-04-17 NOTE — Progress Notes (Signed)
   Palliative Medicine Inpatient Follow Up Note HPI: 72 y.o. male with past medical history of CAD, PUD, DM, OAS, HLD, ESRD who was on PD which was removed on 02/19/23, LUE AVF in place, IR was consulted for image guided tunneled HD cath placement.   Today's Discussion 04/17/2023  *Please note that this is a verbal dictation therefore any spelling or grammatical errors are due to the "[employer]" system interpretation.  Chart reviewed inclusive of vital signs, progress notes, laboratory results, and diagnostic images. Still requiring intermittent IV doses of ativan.   I met with Jefferson at bedside this morning. He is alert to self and quite drowsy during my time with him. He is being watched at the nursing station.   I spoke to patients RN, Lurena Joiner this morning to alert her of the HD plan.  It appears that plan was modified to a trial of HD at bedside on Thursday.  I spoke to patients spouse, [name]. I was open and honest sharing that if Narek can tolerate HD with her present then he could transition to an outpatient clinic though if he exemplifies agitational behaviors then he will likely need hospice support.   Questions and concerns addressed/Palliative Support Provided.   Objective Assessment: Vital Signs Vitals:   04/17/23 0254 04/17/23 0905  BP: (!) 156/78   Pulse: (!) 101   Resp: 16   Temp: 98.5 F (36.9 C)   SpO2: 96% 96%    Intake/Output Summary (Last 24 hours) at 04/17/2023 1210 Last data filed at 04/17/2023 0700 Gross per 24 hour  Intake 118 ml  Output --  Net 118 ml   Last Weight  Most recent update: 04/15/2023  5:11 AM    Weight  90.6 kg (199 lb 11.8 oz)            Gen:  Elderly AA M in NAD HEENT: moist mucous membranes CV: Regular rate and rhythm  PULM: On RA, breathing is even and nonlabored ABD: soft/nontender  EXT: No edema  Neuro: Oriented to self, drowsy  SUMMARY OF RECOMMENDATIONS   DNAR/DNI  Goal to continue iHD --> though this is inhibited  by agitational behaviors. Patient is being premedicated prior to treatment.  --> Plan for iHD at bedside on Thursday and additional conversation from there  Allowing time for outcomes  Appreciate ongoing Chaplain support  Ongoing Palliative care support   Billing based on MDM: Moderate  ______________________________________________________________________________________ Lamarr Lulas [place] [facility]iative Medicine Team Team Cell Phone: [phone] Please utilize secure chat with additional questions, if there is no response within 30 minutes please call the above phone number  Palliative Medicine Team providers are available by phone from 7am to 7pm daily and can be reached through the team cell phone.  Should this patient require assistance outside of these hours, please call the patient's attending physician.

## 2023-04-17 NOTE — Progress Notes (Signed)
Physical Therapy Session Note  Patient Details  Name: MOHAB ASHBY MRN: [id number] Date of Birth: 19-Jul-1950  Today's Date: 04/17/2023 PT Individual Time: 0847-0908 PT Individual Time Calculation (min): 21 min   Short Term Goals: Week 5:  PT Short Term Goal 1 (Week 5): To continue to consistently meet LTG  Skilled Therapeutic Interventions/Progress Updates:    Pt presents in room in [facility], asleep, lethargic. Pt reports that he feels "not worth s**" when asked how he was doing. Pt reports knee pain but unable to elaborate further when asked. Session focsued on participation with self care tasks. Pt requesting to wash hands having just eaten breakfast, therapist positions pt in front of sink and pt requires max assist and max multi modal cues to participate with hand washing. Pt unable to follow verbal commands, demonstrates significant R lateral lean while seated. Therapist assist with repositioning in [facility] with pt able to maintain minimally. Pt transported to day room, therapist prompts pt to complete WC mobility requires hand over hand total assist and max multimodal cues however pt unable to remain awake to participate. Pt unable to continue participation due to lethargy and pt transported back to nurse's station and remains sitting in [facility] with chair alarm donned and activated at end of session.  Therapy Documentation Precautions:  Precautions Precautions: Fall Restrictions Weight Bearing Restrictions: No   Therapy/Group: Individual Therapy  Edwin Cap PT, DPT 04/17/2023, 9:14 AM

## 2023-04-17 NOTE — Progress Notes (Signed)
[facility] Progress Note   Subjective:   Patient seen and examined at nurses station. Sleepy, sedated and sitting in wheelchair.  Wakes to repeat verbal stimuli and states "I dont know what's going on" when asked questions. Talked to wife today who agreed to sit with patient during dialysis on Thursday afternoon in his room to see if it helps with behavior during treatment.    Objective Vitals:   04/17/23 0254 04/17/23 0905 04/17/23 1355 04/17/23 1356  BP: (!) 156/78  106/60 111/68  Pulse: (!) 101  (!) 44 (!) 56  Resp: 16  18 17   Temp: 98.5 F (36.9 C)     TempSrc:      SpO2: 96% 96% 93% 94%  Weight:   90.6 kg   Height:       Physical Exam General:sedated, elderly male in NAD, sitting in wheelchair Heart:RRR, no mrg Lungs:CTAB, nml WOB on RA Abdomen:soft, NTND Extremities:trace LE edema Dialysis Access: LU AVF +b/t   Filed Weights   04/14/23 1213 04/15/23 0500 04/17/23 1355  Weight: 92.2 kg 90.6 kg 90.6 kg    Intake/Output Summary (Last 24 hours) at 04/17/2023 1411 Last data filed at 04/17/2023 0700 Gross per 24 hour  Intake 118 ml  Output --  Net 118 ml    Additional Objective Labs: Basic Metabolic Panel: Recent Labs  Lab 04/11/23 1008 04/14/23 1152  NA 136 133*  K 4.2 4.7  CL 97* 94*  CO2 25 23  GLUCOSE 152* 154*  BUN 45* 50*  CREATININE 12.26* 12.62*  CALCIUM 9.1 9.3  PHOS 4.7* 4.6   Liver Function Tests: Recent Labs  Lab 04/11/23 1008 04/14/23 1152  ALBUMIN 2.4* 2.6*    CBC: Recent Labs  Lab 04/11/23 1008 04/14/23 1152  WBC 4.5 4.3  HGB 9.5* 10.3*  HCT 31.9* 35.1*  MCV 94.4 90.9  PLT 186 242   Blood Culture    Component Value Date/Time   SDES URINE, CATHETERIZED 04/08/2023 0915   SPECREQUEST  04/08/2023 0915    NONE Performed at [facility], [address] [place], [place] [address]    CULT >=100,000 COLONIES/mL STAPHYLOCOCCUS HAEMOLYTICUS (A) 04/08/2023 0915   REPTSTATUS 04/10/2023 FINAL 04/08/2023 0915     Medications:   (feeding supplement) PROSource Plus  30 mL Oral BID BM   ARIPiprazole  7.5 mg Oral QHS   aspirin EC  81 mg Oral Daily   atorvastatin  80 mg Oral Daily   Chlorhexidine Gluconate Cloth  6 each Topical Q0600   darbepoetin (ARANESP) injection - DIALYSIS  150 mcg Subcutaneous Q Sun-1800   divalproex  125 mg Oral Q8H   feeding supplement  237 mL Oral BID WC   Gerhardt's butt cream  1 Application Topical QID   lidocaine  1 patch Transdermal Q2200   lidocaine  1 patch Transdermal Q24H   memantine  5 mg Oral Daily   mometasone-formoterol  2 puff Inhalation BID   pantoprazole  40 mg Oral BID   traZODone  50 mg Oral QHS    Dialysis Orders: Previously on [facility] thru [facility] - changed to HD - will need outpatient HD unit established prior to d/c. 4hr, 3K, EDW ~96, L AVF    Assessment/Plan: 1. Debility - in CIR 2. Encephalopathy/agitation: Waxing/waning agitation, dementia, sundowning. No acute findings on imaging. He has been disruptive during HD.  Likely needs family as sitter for safety, but sitters are not typically allowed in the hospital HD unit. Plan for bedside  HD with wife as sitter to see if patient behavior improved with her to help redirect him.  If not will need to strongly consider transition to hospice as he will not be a candidate for outpatient dialysis d/t concern for patient/staff safety.  Previously discussed w/HD staff - last treatment noted to be better with no significant behavior problems, previously most if not all of his recent HD sessions have been problematic due to resistance and aggressive behavior. Have been using IV ativan to assist w/ getting him through dialysis. Use of IV Ativan can not be done in OP HD units. IR will not place catheter, agitation is not an indication to place cathter.  Rehab team adjusting meds.  3. ESRD: Transitioned from PD to HD recently on TTS schedule. Using old AVF which is still functional. Off schedule last week for  HD this week d/t behavior.  Family would prefer TTS HD. Coordinate with rehab team so they know to premedicate before he comes to [facility]. HD today per regular schedule.  Wife to sit with him on Thursday.  4.Hypertension/volume: BP in goal. Midodrine 5mg  stopped. UF as tolerated.  5. Anemia: Hgb 10.3-on Aranesp weekly. Last Tsat 38% with ferritin 2175.  No iron.  6. Metabolic bone disease: CorrCa 09.8 , Phos acceptable. No VDRA. 7.  Nutrition:  Alb 2.6, continue supps.  Renal diet w/fluid restrictions.  8.  Hx NSVT: On Coreg 10.  Recent culture negative PD cath-related peritonitis and associated fungemia + enterococcus bacteremia - underwent PD cath removal, TDC placement and removal, temp HD cath placement  and removal, and line holiday. Completed IV unasyn, IV micafungin and cipro on 8/15. TDC not replaced cause he had functional AVF.   11. Hematuria - dirty UA. Coag neg staph on Ucx - defer to primary team  12. GOC - Seen by palliative care - Now DNR/DNI wants to continue dialysis. See above. 13. Dispo: For SNF placement. Regarding outpatient dialysis placement- per renal navigator he will need new referral since he has been gone from his prior clinic so long. Family members all willing to sit with him during treatments.  At this time he is not appropriate for outpatient dialysis d/t safety concerns with ongoing agitation and requiring restraints and multiple staff to reorient during treatments.  Virgina Norfolk, PA-C [facility] 04/17/2023,2:11 PM  LOS: 36 days

## 2023-04-17 NOTE — Progress Notes (Signed)
HD Progress Note:   04/17/23 1755  Vitals  Temp 97.9 F (36.6 C)  Temp Source Oral  BP (!) 162/91  Pulse Rate 72  Resp 20  Oxygen Therapy  SpO2 92 %  O2 Device Room Air  Patient Activity (if Appropriate) In bed  Post Treatment  Dialyzer Clearance Clear  Hemodialysis Intake (mL) 0 mL  Liters Processed 49.5  Fluid Removed (mL) 1500 mL  Tolerated HD Treatment Yes  AVG/AVF Arterial Site Held (minutes) 5 minutes  AVG/AVF Venous Site Held (minutes) 5 minutes    Had a very challenging HD treatment with this patient today. Behavior of concern: VERY confused and unable to stay still in bed while on HD treatment. Was shouting very loud, kicking the staff when trying to stabilize his AVFistula with the needle in it. Squeezed the fingers of one of the staff who was assigned to just sit down with him. Was given once dose of IV Ativan, it did calm him down and put him to sleep. But when he woke up, he started climbing out of bed again. Staff have to terminate his HD early.  Impression: Even with one is to one staff, it's difficult to continue with his HD regime if his behavior / mentation won't improve.

## 2023-04-17 NOTE — Progress Notes (Signed)
SLP Cancellation Note  Patient Details Name: [name] [name] MRN: [id number] DOB: Dec 12, 1950   Cancelled treatment:       Patient missed 30 minutes of skilled SLP intervention due to being off unit for dialysis.                                                                                                 Carliss Porcaro 04/17/2023, 2:40 PM

## 2023-04-18 ENCOUNTER — Encounter (HOSPITAL_COMMUNITY): Payer: Medicare Other

## 2023-04-18 DIAGNOSIS — Z7189 Other specified counseling: Secondary | ICD-10-CM | POA: Diagnosis not present

## 2023-04-18 DIAGNOSIS — Z515 Encounter for palliative care: Secondary | ICD-10-CM | POA: Diagnosis not present

## 2023-04-18 LAB — CBC WITH DIFFERENTIAL/PLATELET
Abs Immature Granulocytes: 0.02 10*3/uL (ref 0.00–0.07)
Basophils Absolute: 0 10*3/uL (ref 0.0–0.1)
Basophils Relative: 1 %
Eosinophils Absolute: 0.1 10*3/uL (ref 0.0–0.5)
Eosinophils Relative: 3 %
HCT: 33.6 % — ABNORMAL LOW (ref 39.0–52.0)
Hemoglobin: 10.1 g/dL — ABNORMAL LOW (ref 13.0–17.0)
Immature Granulocytes: 1 %
Lymphocytes Relative: 14 %
Lymphs Abs: 0.6 10*3/uL — ABNORMAL LOW (ref 0.7–4.0)
MCH: 26.9 pg (ref 26.0–34.0)
MCHC: 30.1 g/dL (ref 30.0–36.0)
MCV: 89.6 fL (ref 80.0–100.0)
Monocytes Absolute: 0.5 10*3/uL (ref 0.1–1.0)
Monocytes Relative: 13 %
Neutro Abs: 3 10*3/uL (ref 1.7–7.7)
Neutrophils Relative %: 68 %
Platelets: 220 10*3/uL (ref 150–400)
RBC: 3.75 MIL/uL — ABNORMAL LOW (ref 4.22–5.81)
RDW: 15.9 % — ABNORMAL HIGH (ref 11.5–15.5)
WBC: 4.3 10*3/uL (ref 4.0–10.5)
nRBC: 0 % (ref 0.0–0.2)

## 2023-04-18 LAB — GLUCOSE, CAPILLARY
Glucose-Capillary: 73 mg/dL (ref 70–99)
Glucose-Capillary: 88 mg/dL (ref 70–99)

## 2023-04-18 LAB — COMPREHENSIVE METABOLIC PANEL
ALT: 21 U/L (ref 0–44)
AST: 30 U/L (ref 15–41)
Albumin: 2.5 g/dL — ABNORMAL LOW (ref 3.5–5.0)
Alkaline Phosphatase: 78 U/L (ref 38–126)
Anion gap: 15 (ref 5–15)
BUN: 36 mg/dL — ABNORMAL HIGH (ref 8–23)
CO2: 25 mmol/L (ref 22–32)
Calcium: 9.2 mg/dL (ref 8.9–10.3)
Chloride: 94 mmol/L — ABNORMAL LOW (ref 98–111)
Creatinine, Ser: 9.5 mg/dL — ABNORMAL HIGH (ref 0.61–1.24)
GFR, Estimated: 5 mL/min — ABNORMAL LOW (ref >60)
Glucose, Bld: 118 mg/dL — ABNORMAL HIGH (ref 70–99)
Potassium: 3.9 mmol/L (ref 3.5–5.1)
Sodium: 134 mmol/L — ABNORMAL LOW (ref 135–145)
Total Bilirubin: 0.8 mg/dL (ref 0.3–1.2)
Total Protein: 6.9 g/dL (ref 6.5–8.1)

## 2023-04-18 LAB — AMMONIA: Ammonia: 33 umol/L (ref 9–35)

## 2023-04-18 LAB — VALPROIC ACID LEVEL: Valproic Acid Lvl: 18 ug/mL — ABNORMAL LOW (ref 50.0–100.0)

## 2023-04-18 MED ORDER — CHLORHEXIDINE GLUCONATE CLOTH 2 % EX PADS: 6.0000 | MEDICATED_PAD | Freq: Every day | CUTANEOUS | Status: DC

## 2023-04-18 MED ORDER — HYDROXYZINE HCL 10 MG PO TABS
10.0000 mg | ORAL_TABLET | Freq: Once | ORAL | Status: AC
  Administered 2023-04-18: 10 mg via ORAL
  Filled 2023-04-18: qty 1

## 2023-04-18 MED ORDER — LORAZEPAM 0.5 MG PO TABS
0.5000 mg | ORAL_TABLET | Freq: Every day | ORAL | Status: DC | PRN
  Administered 2023-04-18: 0.5 mg via ORAL
  Filled 2023-04-18 (×2): qty 1

## 2023-04-18 MED ORDER — SODIUM CHLORIDE 0.9 % IV SOLN
Freq: Once | INTRAVENOUS | Status: DC
  Filled 2023-04-18: qty 100

## 2023-04-18 MED ORDER — DEXTROSE 5 % IV SOLN
Freq: Once | INTRAVENOUS | Status: AC
  Filled 2023-04-18: qty 100

## 2023-04-18 MED ORDER — LORAZEPAM BOLUS VIA INFUSION: 0.5000 mg | INTRAVENOUS | Status: DC | PRN

## 2023-04-18 MED ORDER — HYDROXYZINE HCL 10 MG PO TABS
10.0000 mg | ORAL_TABLET | Freq: Three times a day (TID) | ORAL | Status: DC | PRN
  Administered 2023-04-18 – 2023-05-03 (×10): 10 mg via ORAL
  Filled 2023-04-18 (×14): qty 1

## 2023-04-18 MED ORDER — DEXTROSE 5 % IV SOLN
INTRAVENOUS | Status: DC
  Filled 2023-04-18 (×4): qty 100

## 2023-04-18 MED ORDER — HYDROXYZINE HCL 10 MG PO TABS
10.0000 mg | ORAL_TABLET | Freq: Three times a day (TID) | ORAL | Status: DC | PRN
  Filled 2023-04-18 (×2): qty 1

## 2023-04-18 MED ORDER — VITAMIN C 500 MG PO TABS: 500.0000 mg | ORAL_TABLET | Freq: Every day | ORAL | Status: DC

## 2023-04-18 MED ORDER — SODIUM CHLORIDE 0.9 % IV SOLN: INTRAVENOUS | Status: DC | PRN

## 2023-04-18 NOTE — Progress Notes (Addendum)
Patient became quite restless once returned to bed from nurse's station to await IV team at approx 1300. Several attempts were made by patient to exit bed despite education on safety. Patient became increasingly agitated and turned physical when IV Team and RN were attempting IV placement. Distress button activated as patient aggressively grabbed IV team member right wrist and would not let go. A call was placed to MD admin atarax PO (as it was prescribed for itching in HD)  as ativan had been d/c at the time. Several staff members were required for approx to an hour to calm and physically transfer patient to [facility] to transport to nurse's station. Patient eventually calmed without atarax; he refused to take. Patient calmly stayed at nurse's station again for approx 1.5 hours until wife arrived. Patient taken to room and wife fed him dinner, continuing to remain calm.

## 2023-04-18 NOTE — Progress Notes (Signed)
PROGRESS NOTE   Subjective/Complaints:  Further agitation with dialysis last night despite bedside sitter, required IM Ativan which temporarily assisted but still needed HD stopped early.  Wife expressing concerns regarding increased confusion after starting Namenda, however patient has been getting intermittent Haldol and Ativan which is likely not helping.  Discussing with psychiatry.  Decreased p.o. intakes over the last 24 hours.  Last bowel movement yesterday. Shaky and tired today; no focal deficits. BG 70s on nurse check; 80s after meal intake. Ordering stat labs, VPA level; DC nemenda as only major med added within last few days.    [id number] due to cognition/behavioral   Objective:   No results found. No results for input(s): "WBC", "HGB", "HCT", "PLT" in the last 72 hours.     No results for input(s): "NA", "K", "CL", "CO2", "GLUCOSE", "BUN", "CREATININE", "CALCIUM" in the last 72 hours.      Intake/Output Summary (Last 24 hours) at 04/18/2023 0828 Last data filed at 04/17/2023 1755 Gross per 24 hour  Intake --  Output 1500 ml  Net -1500 ml           Physical Exam: Vital Signs Blood pressure (!) 140/87, pulse 88, temperature 97.8 F (36.6 C), resp. rate 16, height 5\' 4"  (1.626 m), weight 90.8 kg, SpO2 (!) 88%.   General: No acute distress.  Patient sitting upright in wheelchair in bathroom, working with OT.  Mildly lethargic, but arousable to verbal. Mood and affect are difficult to determine due to lethargy, and participation. Heart: Regular rate and rhythm no rubs murmurs or extra sounds Lungs: Clear to auscultation, breathing unlabored, no rales or wheezes Abdomen: Positive bowel sounds, soft nontender to palpation, nondistended Extremities: No clubbing, cyanosis, or edema Skin: No evidence of breakdown, no evidence of rash Neurologic: AAO to self, oriented to hospital but not correct location,,  not to time.. + Cognitive deficits, difficulty with recent memory.  Lethargic, keeping eyes closed preferentially but can open up if prompted. Cranial nerves II through XII intact, motor strength is moving all 4 extremities with considerable encouragement, 5- out of 5 throughout  Response to light touch in all 4 extremities At rest, some mild jerking/spasms of the right lower extremity.  This seems to improve on subsequent exams. Musculoskeletal: no pain with range of motion in all 4 extremities. No joint swelling       Assessment/Plan: 1. Functional deficits which require 15 hr per week of interdisciplinary therapy in a comprehensive inpatient rehab setting. Physiatrist is providing close team supervision and 24 hour management of active medical problems listed below. Physiatrist and rehab team continue to assess barriers to discharge/monitor patient progress toward functional and medical goals  Care Tool:  Bathing    Body parts bathed by patient: Right arm, Left arm, Chest, Abdomen, Front perineal area, Right upper leg, Buttocks, Left upper leg, Right lower leg, Left lower leg, Face         Bathing assist Assist Level: Moderate Assistance - Patient 50 - 74%     Upper Body Dressing/Undressing Upper body dressing   What is the patient wearing?: Pull over shirt    Upper body assist Assist Level: Minimal Assistance - Patient >  75%    Lower Body Dressing/Undressing      Lower body dressing      What is the patient wearing?: Underwear/pull up, Pants     Lower body assist Assist for lower body dressing: Maximal Assistance - Patient 25 - 49%     Toileting Toileting    Toileting assist Assist for toileting: Maximal Assistance - Patient 25 - 49%     Transfers Chair/bed transfer  Transfers assist     Chair/bed transfer assist level: Minimal Assistance - Patient > 75%     Locomotion Ambulation   Ambulation assist      Assist level: Minimal Assistance -  Patient > 75% Assistive device: No Device Max distance: 175'   Walk 10 feet activity   Assist     Assist level: Contact Guard/Touching assist Assistive device: No Device   Walk 50 feet activity   Assist Walk 50 feet with 2 turns activity did not occur: Safety/medical concerns  Assist level: Minimal Assistance - Patient > 75% Assistive device: No Device    Walk 150 feet activity   Assist Walk 150 feet activity did not occur: Safety/medical concerns  Assist level: Minimal Assistance - Patient > 75% Assistive device: No Device    Walk 10 feet on uneven surface  activity   Assist Walk 10 feet on uneven surfaces activity did not occur: Safety/medical concerns         Wheelchair     Assist Is the patient using a wheelchair?: Yes Type of Wheelchair: Manual    Wheelchair assist level: Dependent - Patient 0%      Wheelchair 50 feet with 2 turns activity    Assist        Assist Level: Dependent - Patient 0%   Wheelchair 150 feet activity     Assist      Assist Level: Dependent - Patient 0%   Blood pressure (!) 140/87, pulse 88, temperature 97.8 F (36.6 C), resp. rate 16, height 5\' 4"  (1.626 m), weight 90.8 kg, SpO2 (!) 88%.  Medical Problem List and Plan: 1. Functional deficits secondary to metabolic encephalopathy due to sepsis secondary to catheter peritonitis and hypoxemia              -patient may not shower             -ELOS/Goals: 16-18 days, PT/OT Sup, SLP min A -tentative discharge date 9-9           -Continue CIR therapies including PT, OT, and SLP   -Will review records from [facility] -- has worsened since approximately 8/22, possible 8/19, consistent with records from nursing at [facility] with current behavior.   -Therapy QD schedule  Con't CIR PT, OT and SLP  Patient was seen by palliative care, changed to DNR status  - 9/30: Discussed with Nephrology regarding setup so wife can be at bedside during dialysis; if able to keep  him calm, could accommodate as OP. If unable to keep him calm, may need to consider hospice care.  Plan to trial this in room at bedside on Thursday.   2.  Antithrombotics: -DVT/anticoagulation:  Pharmaceutical: Heparin, 03/23/23 changed to Lovenox  - Dced d/t ?; will hold off d/t recurrent falls and ongoing agitation.             -antiplatelet therapy: aspirin and Plavix   3. Pain Management: Tylenol as needed             -Lidoderm patches to back  -Elavil  25 mg at bedtime for nerve pain and sleep 8/27  -8/29: Pain well controlled on Elavil; DC lyrica, PRN Tylenol/oxy 2.5-5 mg d/t CKD  8-30: Appears patient has been getting pain medications together overnight along with antipsychotics, will discuss with nursing  8-31: Patient having an difficulty endorsing needs and not utilizing PRNs, scheduled oxycodone 5 mg 3 times daily and has 2.5 mg prn 3 times daily available.   9-1: Not utilizing PRNs, minimal complaints of pain, will discontinue scheduled given lethargy today..  -muscle rub R shoulder  9/16 patient reports pain is under control overall   9/26-10-2 no report of pain noted  4. Mood/Behavior/Sleep: LCSW to evaluate and provide emotional support             -continue Seroquel 50 mg TID             -clonazepam 0.5 mg TID prn             -Geodon 10 mg IM q 12 hours prn             -antipsychotic agents: n/a  -8-27: Agitated by soft restraints in bed, transition to 4 side rails, Posey alarm, and TeleSitter for now.  Veil bed on standby if needed, however feel we should avoid this if possible due to likelihood of agitating patient further   - 8/28: Placed in veil bed; wrist restraints for dialysis. Scheduled Geodon 20 mg twice daily; discontinue Seroquel and clonazepam due to no perceivable benefit. Add as needed Zyprexa 5 mg twice daily p.o. or IM due to recent cardiac arrhythmias and concern for QTc prolongation. As needed ramelteon for sleep. Stop gabapentin due to possible side effect of  agitation  - 8/28: No improvement except temporarily with Geodon; add depakote 125 mg q8h for ongoing aggitation, mood stabilization. Increase elavil to 50 mg at bedtime.  Workup ongoing.    - 8/29: EEG negative, labs normal. MRI pending. Sleeping well and calm since starting depakote and increase in elavil. Will monitor tonight and wean Geodon in AM if appropriate.   8-30: MRI showing chronic microvascular changes, otherwise normal.  Increasingly calm and appropriate during the day, with ongoing agitation at nighttime.  Will increase nighttime Geodon to 40 mg; will discuss sleep log with nursing, as it has not been performed.  8-31 no sleep overnight per sleep log, again getting agitated around 9 PM and 5 AM.  Not responsive to as needed Zyprexa, responsive to Ativan.  Attempted to wean off scheduled a.m. Geodon today, but patient became extremely agitated and unable to redirect, calm down with readministration of medication.  Resume Geodon 20 mg every morning/40 mg every afternoon, increased Elavil to 75 mg nightly, adding trazodone 50 mg nightly as needed.  P.o./IV Ativan 0.5 mg every 6 hours for agitation.  9-1: slept well overnight with trazodone and increased Elavil, no PRNs used.  Unfortunately, oversedated today.  Discontinue scheduled pain medications as above, start wean of Elavil to 50 mg x2 days -> 25 mg x2 days -> 10 mg x2 days, off due to no apparent benefit and no complaints of pain.  Reduce Geodon from 20 mg every morning/40 mg every evening to 20 mg twice daily.  Continue trazodone as needed for sleep, as this seems to be the difference for a restful night yesterday.  If he remains overly sedated, would dose reduce and DC Depakote next.  Keep Ativan as needed d/t dialysis not having other options available.  9/2-3--non agitated but still sleepy   -  dc'ed depakote as I doubt it's doing much at this dose anyway   -continue geodon at 20mg  bid   -scheduled trazodone at HS with back up dose if  needed    -sleep was intermittent last night   -requires enclosure bed for safety  9/4 continue enclosure bed for now, consider trying DC of this device early next week  -9/5 appears to be doing better overall this morning, continues to have intermittent agitation at times.  Continue enclosure bed  -9/6 agitation appears to be improving, patient is skeptical about medications and has been declining some of these.  Will discontinue multivitamin, RisaQuad to try to decrease pill burden  -9/7 continues to have intermittent episodes of agitation however overall improving, appears to be taking most of his medications  9/9 agitation and mental status appears to be improving -discussing discontinuation of enclosure bed with team 9/10 Geodon dose increased to 40 mg at night, continue 20 mg in the morning 9/11 No acute events noted, will check with team regarding DC enclosure bed, nursing reports he did ok last night but had dialysis 9/12 continues to have sundowning/waxing waning confusion and agitation, continue enclosure bed for now, will caution with Geodon will continue current dose but hold off on any escalation as this can contribute to bradycardia 9/16 no longer using enclosure bed, continue Geodon 20 mg twice daily for now due to intermittent agitation 9/17 patient with agitation and confusion at night, has sedation during the day.  Discussed with psychiatry Dr. Gasper Sells.  Will discontinue Geodon and try Abilify tonight.  Dr. Gasper Sells also suggested considering donepezil to shorten hallucinations of delirium.  Psychiatry consult placed, will see tomorrow.  Appreciate assistance 9/18 no behavioral related notes overnight, psychiatry plans to see him today.  Appreciate assistance 9/19 patient was seen by psychiatry yesterday, recommended continue Abilify, consider donepezil or memantine, suggested avoiding stimulants.  Will continue current regimen for now as it appears he is doing better.  He  appears to be less sedated and did not see frequent notes of agitation but this regimen thus far. 9/20 recheck CMP/ammonia/VPA level today due to continued drowsiness  9/21- Depakote level 16- too low- Ammonia 19- low- asleep this AM, but haven't been called today about sedation/lethargy.  9/22- more hallucinations per nursing- labs attempted- couldn't get- couldn't get U/A results since so much hematuria. Trying to get labs, to see if getting ill vs missing HD? 9/23 will go back to 3 times daily for Depakote to help avoid agitation 9/24 continue current regimen with Abilify and start memantine 5 mg daily 9/27 increase Abilify to 7.5 mg daily to help reduce agitation 9/30: No reported events, continue current regimen 10-1: Agitated overnight, got both p.o. and IV Ativan.  Likely lethargic today from this.  Monitor for neurologic changes. 10-2: IM Ativan again overnight, lethargic today.  Blood glucose 70s on check, jerking and affect seem to improve mildly with food.  Did become agitated with attempted IV today.  To avoid oversedation, adjusted PRNs to Atarax first line as this seemed effective today, Ativan 0.5 mg daily as needed as second line p.o. on floor or IV with dialysis.  Discontinue Namenda, as confusion and increased agitation started around when this was started.  5. Neuropsych/cognition: This patient is not capable of making decisions on his own behalf   -recommend neuropsych consult   - 8/27: Ongoing aggitation; Consult hospitalist for further workup on potential contributors to encephalopathy. Pending UA, LA 2.1-> 1.4; otherwise negative so far -  Delirium precautions, sleep log, Veiled bed for safety and to minimize restraints -Wife has expressed that she is not interested in palliative care at this time; hopefully with above measures patient can get dialysis in the next 48 hours to limit worsening uremic encephalopathy and medical decline    - 8/29: Aggitation much improved, able to  get some dialysis last night and today. See above for medication changes. Calling wife tonight to discuss.   8-30: Still with moderate cognitive deficits, however appears once patient is less agitated he is better able to concentrate and has better orientation.  MRI negative for acute findings, monitor.  9/3 agitation better. More sedated than anything at this point. Confused as well.    -depakote stopped   -continue other meds as rx'ed  9/21- pt refused Depakote this AM "since doesn't have seizures".   9/30: compliant with depakote over weekend; calm, alert, oriented; continue current regimen  10-1: Agitated overnight, does better with wife at bedside per reports.  10-2: Reconsult psychiatry given increasing agitation despite medication adjustments.  Repeat VPA levels.  CMP CBC look okay.  See their note for full recommendations, starting vitamin C repletion.   6. Skin/Wound Care: Routine skin care checks             -sacral skin off-loading/protection   7. Fluids/Electrolytes/Nutrition: Strict Is and Os and follow-up chemistries per nephrology             -renal diet/ 1200 cc FR  - Renvela 800 mg 3 times daily for hyperphosphatemia added per nephrology 8-27   9/21- Last Cr 7.07 and BUN 22 8: Hypotension: monitor TID and prn             -continue midodrine as needed during dialysis  Normotensive, monitor  9-1: Consistently 90s over 60s, has been stable but given findings of microvascular changes on MRI, may be too low for patient to perfuse properly.  Will add midodrine 5 mg tid and monitor.  DC Coreg as heart rate has been low to normal; may resume at low-dose once blood pressure improved.   9/2 continue with ccurrent regimen. Bp's hr remain low   -elavil wean should help  9/9-10 BP stable, continue midodrine and monitor  9/13 DC midodrine today as BP has been higher, can restart if needed  Improved, cont to monitor  9/27-9/30 BP stable overall  10-2: Increasing hypertension over the  last few days, does tend to follow with dialysis so will monitor for now and add low-dose Norvasc if no improvement by tomorrow    04/17/2023    8:02 PM 04/17/2023    5:55 PM 04/17/2023    5:30 PM  Vitals with BMI  Weight  200 lbs 3 oz   Systolic 140 162 409  Diastolic 87 91 88  Pulse 88 72 72      9: Hyperlipidemia: continue statin   10: CAD s/p CABG 2023: on DAPT and statin.  - troponin elevated but stable, monitor 8/27   11: DM-2: A1c = 6.3% (at home on Lantus 22 units at bedtime, Tradjenta 5 mg daily)        -monitor PO intake             -continue SSI (0-6 units) does not appear to be requiring coverage   - 9/6 DC SSI, change CBGs to daily  9/8 discontinue CBGs  9/16 glucose still controlled on BMP yesterday   9/23 glucose 100 on BMP yesterday   - 10-2: Hypoglycemic  today, 70s, improved with p.o. intakes, 88 at recheck.  Likely due to poor p.o. intakes last few days. Recent Labs    04/17/23 0251 04/18/23 0909 04/18/23 0937  GLUCAP 104* 73 88     12: ESRD: HD on Tues, Thurs, Sat -nephrology made aware   - Unable to tolerate 8/27 d/t aggitation, stopped early 8/28;  - attempting again 8/29, stopped early again -Per nephrology, redirectable with gum -Have to switch restraints during dialysis sessions from veil bed to regular bed, then resume veil bed order once at rehab -9/9 tolerating HD better 9/18 patient's wife indicates she would be able to stay with him during dialysis after discharge   9/22- pt refused/too agitated ot get HD yesterday  9/24 nephrology does not feel like he is a candidate for outpatient dialysis at this time due to agitation  9/27 will attempt hemodialysis in the mornings, premedicated to help reduce risk of agitation  9/30: See above, will try wife at bedside with dialysis   13: COPD/OSA; chronic home 2L O2 (Symbicort, Ventolin, prednisone 40 mg daily at home)             -continue symbicort  -Added home oxygen back 8-27   14: GERD: continue  Protonix   15: Non-sustained VT/pAF/bradycardia: per cardiology, Dr. Algie Coffer: -continue carvedilol 6.25 mg BID; consider increasing to 12.5 mg BID before starting low dose amiodarone  -consider Eliquis and discontinue asa and Plavix in one week if no active GI bleeding and stable hemoglobin from 8/24 -hemoglobin has remained stable but very low around 7, no recurrent A-fib, monitor for now given agitation/fall 8-31 and consider initiating Eliquis when safe -Continue mag-ox to avoid hypomagnesemia -8-27: Single instance of bradycardia this morning 40s, may have been missed read, monitor through today and do not further adjust beta-blocker medications  -9-3 hr controlled after holding coreg -9/4-5 HR stable, continue to monitor -9/9 consider Eliquis this week if agitation continues to improve -9/10 will hold off on Eliquis due to sundowning at night, will see how he does with increase Geodon -9/11 EKG for bradycardia episode today, 2nd degree A-V block mobitz type 1.  Continue to hold beta blocker,9/11 will restart eliquis at 2.5 mg BID dose. Will consult cardiology regarding need for ASA/plavix and coreg.  Dr. Elease Hashimoto does not appear convinced that he has A-fib 9/12 appreciate cardiology consult yesterday, DAPT transition to daily aspirin 81 only and Eliquis was discontinued, caution with Geodon as this can contribute to bradycardia 9/13 will decrease Geodon nighttime dose to 20 due to episodes of bradycardia 9/17 will discontinue Geodon due to variable cardiac effects 9/24 bradycardia continues to be improved after Geodon discontinued 9/30: recurrent fall; no current dvt ppx Heart rate currently well-controlled, hypertension as above     04/17/2023    8:02 PM 04/17/2023    5:55 PM 04/17/2023    5:30 PM  Vitals with BMI  Weight  200 lbs 3 oz   Systolic 140 162 244  Diastolic 87 91 88  Pulse 88 72 72       16: HFrEF: Daily weights. Monitor for signs of overload  - 9/7 continue daily  weights, fluid level management per hemodialysis team Weight reviewed and stable  9/21- 94.3- down 3 kg-   9/22- no weight for today- too agitated per nursing 9/30: weights downtrending; good POs; monitor 10-2: Weight stable Filed Weights   04/15/23 0500 04/17/23 1355 04/17/23 1755  Weight: 90.6 kg 90.6 kg 90.8 kg  17: Liver nodularity on CT scan: LFTs reported to be normal. Avoid hepatotoxic medications    -Abdominal ultrasound earlier this admission with minimal ascites; ammonia within normal limits  -8/31: Did discuss with nephrology whether p.o. lactulose would be beneficial despite normal ammonia, after reviewing records unsure that this would be beneficial    18: Epistaxis - resolved             -previously improved with flonase and saline spray   19: Morbid Obesity. Dietary counseling Body mass index is 39.01 kg/m.  20. Bradycardic: continue to monitor HR TID  -Discontinue Geodon  -9/19 heart rate in the 80s, stable  HR stable  21. HTN: resolved after HD, continue to monitor TID  9/19-20 intermittently elevated, continue to monitor.  Cautious with medication addition to avoid hypotension  See above   22. Hematuria - resolved  9/22- required in/out to check U/A- having new hematuria- nursing to keep an eye- since also bleeding from penis a little- monitor   9/23 no wBCs  on UA, follow  urine culture-Staphylococcus hemolyticus-discussed with pharmacy and ID pharmacy.  Think this is more likely contaminant  9/25 discuss with nephrology about possible treatment or other evaluation options  Spoke with Dr. Ronalee Belts, most likely contaminant.  Recommended treating only if symptomatic.  If hematuria continues consider urology input  9/27 urine has improved to clear yellow -   23.  Vitamin C deficiency.  Aggressive repletion per psychiatry, IV over the next 7 days.  LOS: 37 days A FACE TO FACE EVALUATION WAS PERFORMED  Angelina Sheriff 04/18/2023, 8:28 AM

## 2023-04-18 NOTE — Progress Notes (Signed)
Physical Therapy Session Note  Patient Details  Name: [name] [name] MRN: [id number] Date of Birth: 11/21/1950  Today's Date: 04/18/2023 PT Individual Time: 1120-1200 PT Individual Time Calculation (min): 40 min   Short Term Goals: Week 5:  PT Short Term Goal 1 (Week 5): To continue to consistently meet LTG  Skilled Therapeutic Interventions/Progress Updates:     Pt received seated in [facility] and agrees to therapy, but generally appears lethargic and confused throughout session. Pt does not complain of pain. WC transport to gym. Pt performs sit to stand with minA and notable posterior lean and hestance to release grip of WC with LUE. PT provides HHA on Rt and cues for hip extension and posture. PT attempts to assist pt with ambulation but pt is not able or willing to attempt steps at this time, saying, "I just can't do it. I don't know why." Seated rest break. Pt stands with bilateral HHA and cues for initiation and sequencing. PT cued to perform marching in place but is unable to clear either foot from floor, though does shift weight laterally from Rt<>Lt. Pt performs x5 minisquats, though inferior excursion is very minimal. Following rest break, pt able to ambulate x20' with modA +1 HHA and +2 WC follow. Pt perseverates on not being able to "do it" throughout ambulation and requires redirection to task for safety and cues for upright posture. Pt left seated at RN station in [facility] with alarm intact and all needs within reach.   Therapy Documentation Precautions:  Precautions Precautions: Fall Restrictions Weight Bearing Restrictions: No    Therapy/Group: Individual Therapy  Beau Fanny, PT, DPT 04/18/2023, 5:08 PM

## 2023-04-18 NOTE — Progress Notes (Signed)
[facility] Progress Note   Subjective:   Patient seen and examined at nurses station.  Nurse reports agitation for the last and just able to calm back down.  Patient with no complaints.   Objective Vitals:   04/17/23 1755 04/17/23 2002 04/18/23 0923 04/18/23 1307  BP: (!) 162/91 (!) 140/87 (!) 154/93 (!) 148/79  Pulse: 72 88 88   Resp: 20 16 18 18   Temp: 97.9 F (36.6 C) 97.8 F (36.6 C)    TempSrc: Oral     SpO2: 92% (!) 88% 96% 95%  Weight: 90.8 kg     Height:       Physical Exam General:chronically ill appearing male in NAD sitting in wheelchair Heart:RRR, no mrg Lungs:CTAB, no mrg Abdomen:soft, NTND Extremities:no LE edema Dialysis Access: LU AVF +b/t   Filed Weights   04/15/23 0500 04/17/23 1355 04/17/23 1755  Weight: 90.6 kg 90.6 kg 90.8 kg    Intake/Output Summary (Last 24 hours) at 04/18/2023 1529 Last data filed at 04/18/2023 1307 Gross per 24 hour  Intake 520 ml  Output 1500 ml  Net -980 ml    Additional Objective Labs: Basic Metabolic Panel: Recent Labs  Lab 04/14/23 1152 04/18/23 1001  NA 133* 134*  K 4.7 3.9  CL 94* 94*  CO2 23 25  GLUCOSE 154* 118*  BUN 50* 36*  CREATININE 12.62* 9.50*  CALCIUM 9.3 9.2  PHOS 4.6  --    Liver Function Tests: Recent Labs  Lab 04/14/23 1152 04/18/23 1001  AST  --  30  ALT  --  21  ALKPHOS  --  78  BILITOT  --  0.8  PROT  --  6.9  ALBUMIN 2.6* 2.5*   CBC: Recent Labs  Lab 04/14/23 1152 04/18/23 1001  WBC 4.3 4.3  NEUTROABS  --  3.0  HGB 10.3* 10.1*  HCT 35.1* 33.6*  MCV 90.9 89.6  PLT 242 220   Blood Culture    Component Value Date/Time   SDES URINE, CATHETERIZED 04/08/2023 0915   SPECREQUEST  04/08/2023 0915    NONE Performed at [facility], [address], [place], [place] [address]    CULT >=100,000 COLONIES/mL STAPHYLOCOCCUS HAEMOLYTICUS (A) 04/08/2023 0915   REPTSTATUS 04/10/2023 FINAL 04/08/2023 0915     Medications:  sodium chloride 10 mL/hr at  04/18/23 1515   [START ON 04/19/2023] dextrose 5 % 100 mL with ascorbic acid (VITAMIN C) 200 mg infusion      (feeding supplement) PROSource Plus  30 mL Oral BID BM   ARIPiprazole  7.5 mg Oral QHS   [START ON 05/02/2023] ascorbic acid  500 mg Oral Daily   aspirin EC  81 mg Oral Daily   atorvastatin  80 mg Oral Daily   Chlorhexidine Gluconate Cloth  6 each Topical Q0600   darbepoetin (ARANESP) injection - DIALYSIS  150 mcg Subcutaneous Q Sun-1800   divalproex  125 mg Oral Q8H   feeding supplement  237 mL Oral BID WC   Gerhardt's butt cream  1 Application Topical QID   lidocaine  1 patch Transdermal Q2200   lidocaine  1 patch Transdermal Q24H   mometasone-formoterol  2 puff Inhalation BID   pantoprazole  40 mg Oral BID   traZODone  50 mg Oral QHS    Dialysis Orders: Previously on [facility] thru [facility]  - changed to HD - will need outpatient HD unit established prior to d/c. 4hr, 3K, EDW ~96, L AVF    Assessment/Plan:  1. Debility - in CIR 2. Encephalopathy/agitation: Waxing/waning agitation, dementia, sundowning. No acute findings on imaging. He has been disruptive during HD.  Likely needs family as sitter for safety, but sitters are not typically allowed in the hospital HD unit. Plan for bedside HD with wife as sitter to see if patient behavior improved with her to help redirect him.  If not will need to strongly consider transition to hospice as he will not be a candidate for outpatient dialysis d/t concern for patient/staff safety.  Previously discussed w/HD staff - last treatment with loud, aggressive behavior. Have been using IV ativan to assist w/ getting him through dialysis. Use of IV Ativan can not be done in OP HD units. IR will not place catheter, agitation is not an indication to place cathter.  Rehab team adjusting meds.  3. ESRD: Transitioned from PD to HD recently on TTS schedule. Using old AVF which is still functional. Off schedule last week for HD this week d/t behavior.   Family would prefer TTS HD. Coordinate with rehab team so they know to premedicate before he comes to [facility] HD today per regular schedule.  Wife to sit with him on Thursday.  4.Hypertension/volume: BP in goal. Midodrine 5mg  stopped. UF as tolerated.  5. Anemia: Hgb 10.3-on Aranesp weekly. Last Tsat 38% with ferritin 2175.  No iron.  6. Metabolic bone disease: CorrCa 16.1 , Phos acceptable. No VDRA. 7.  Nutrition:  Alb 2.6, continue supps.  Renal diet w/fluid restrictions.  8.  Hx NSVT: On Coreg 10.  Recent culture negative PD cath-related peritonitis and associated fungemia + enterococcus bacteremia - underwent PD cath removal, TDC placement and removal, temp HD cath placement  and removal, and line holiday. Completed IV unasyn, IV micafungin and cipro on 8/15. TDC not replaced cause he had functional AVF.   11. Hematuria - dirty UA. Coag neg staph on Ucx - defer to primary team  12. GOC - Seen by palliative care - Now DNR/DNI wants to continue dialysis. See above. 13. Dispo: For SNF placement. Regarding outpatient dialysis placement- per renal navigator he will need new referral since he has been gone from his prior clinic so long. Family members all willing to sit with him during treatments.  At this time he is not appropriate for outpatient dialysis d/t safety concerns with ongoing agitation and requiring restraints and multiple staff to reorient during treatments.  Virgina Norfolk, PA-C [facility] 04/18/2023,3:29 PM  LOS: 37 days

## 2023-04-18 NOTE — Progress Notes (Signed)
Speech Language Pathology Daily Session Note  Patient Details  Name: [name] [name] MRN: [id number] Date of Birth: 10-Nov-1950  Today's Date: 04/18/2023 SLP Individual Time: 6578-4696 SLP Individual Time Calculation (min): 21 min and Today's Date: 04/18/2023 SLP Missed Time: 9 Minutes Missed Time Reason: Other (Comment) (Psychiatry present)  Short Term Goals: Week 6: SLP Short Term Goal 1 (Week 6): Patient will utilize external aids to orient to time and situation given modA SLP Short Term Goal 2 (Week 6): Pt will complete functional problem solving with 60% acc with max A. SLP Short Term Goal 3 (Week 6): Pt will attend to functional task for 5-7 minutes given mod multimodal cues. SLP Short Term Goal 4 (Week 6): Pt will answer biographical questions via multimodal communication accurately with 75% acc with mod A  Skilled Therapeutic Interventions:   Pt seen for skilled SLP session to address cognitive-linguistic goals. Pt oriented to self and DOB independently. He is disoriented to place and situation. SLP provided correct orientation information and referenced orientation signs on pt's wall. Reviewed purpose and use of call bell multiple times. Pt did recall correct help button and purpose of call bell, "to get help", x1 out of 3 opportunities given mod cues. Pt unable to recall information re: situation and therapy disciplines pt receives daily despite multi-modal cues. Pt missed x9 minutes of SLP session due to appointment with psychiatry physician. Pt left sitting upright in w/c with brakes set and chair alarm set. Physician present and NT notified. Continue SLP PoC.   Pain Pain Assessment Pain Scale: 0-10 Pain Score: 0-No pain  Therapy/Group: Individual Therapy  Ellery Plunk 04/18/2023, 11:15 AM

## 2023-04-18 NOTE — Consult Note (Signed)
[name] [name] Psychiatry Consult Evaluation  Service Date: April 18, 2023 LOS:  LOS: 37 days    Primary Psychiatric Diagnoses  Delirium/Dementia 2.  Acute metabolic encephalopathy 3. Vit C Deficiency    Assessment  [name] [name] is a 72 y.o. male admitted medically to the hospitalists service on 02/15/23 for confusion and SOB in the context of a recent COVID infection and admitted to acute care on 03/12/2023  4:25 PM for acute metabolic encephalopathy assumed due to recent COVID infection.  He has no past psychiatric diagnosis and has a past medical history of COPD, IDT2DM, GERD, and ESRD. Psychiatry was consulted for medication adjustment - lethargy, aggitation, delirium Dr. Gasper Sells aware  by Dr. Shearon Stalls.  The patient's presentation remains most consistent with hypoactive delirium, given relatively acute onset, visual hallucinations, and agitation worse at night. Notably at this point (chronicity), incident dementia also high on differential - both periods of waxing and waning are significantly below premorbid baseline. Most likely due to multiple etiologies including but not limited to infection (recent COVID-19 infection), medications, pain, altered sleep/wake cycle, and limited mobility. Appreciate PT/OT/ST efforts in increasing pt activity during the day.   Patient has had no reported adverse affects to Abilify, which was started the last time psychiatry was involved. This time in discussion with Dr. Shearon Stalls and review of records, he is on the precipice of hospice if he is not able to remain calm during dialysis; this was priority of recommendations today. Notably, I had thought he was going to dialysis today (it was on his schedule) and erroneously ordered 10 mg hydroxyzine to get today.   Minimization of delirogenic insults will continue to be of utmost importance. This includes promoting the normal circadian cycle, minimizing lines/tubes, avoiding deliriogenic medications such as  benzodiazepines and anticholinergic medications, and frequently reorienting the patient. Symptomatic treatment for agitation can be provided by antipsychotic medications, though it is important to remember that these do not treat the underlying etiology of delirium. Notably, there can be a time lag effect between treatment of a medical problem and resolution of delirium. This time lag effect may be of longer duration in the elderly, and those with underlying cognitive impairment or brain injury.   Diagnoses:  Active Hospital problems: Principal Problem:   Acute metabolic encephalopathy Active Problems:   Type 2 diabetes mellitus with chronic kidney disease on chronic dialysis, with long-term current use of insulin (HCC)   HFrEF (heart failure with reduced ejection fraction) (HCC)   Morbid obesity (HCC)   Encephalopathy   Epistaxis   PAF (paroxysmal atrial fibrillation) (HCC)   Long term (current) use of insulin (HCC)   ESRD on dialysis (HCC)   Debility   Agitation   Hematuria     Plan   ## Psychiatric Medication Recommendations:  -- Continue Abilify 7.5 mg, PO, daily at bedtime(agitation) -- START hydroxyzine once before dialysis (thought he was getting it today) and PRN before dialysis (itching) -- Consider memantine if symptoms of delirium do not improve -- start vit C; prefer IV d/t imminence of tx to hospice if no cognitive improvement   ## Medical Decision Making Capacity:  -- Clearly lacks for most decisions; wife is listed as HCPOA  ## Further Work-up:  -- Most recent EKG on 03/02/23 had QtC of 469 -- Pertinent labwork reviewed earlier this admission includes: v low vit C    reasonably extensive work-up largely wnl with the exception of labs associated with ESRD including low eGFR, high BUN, and  high albumin, all of which are associated with an increased risk of dementia, delirium, and acute metabolic encephalopathy.  ## Disposition:  -- Patient's wife prefers that  patient be discharged to a rehab facility; if not possible she prefers he be discharged home and to transport patient to a dialysis center as needed. No psychiatric contraindications to this plan - defer to PT, OT, and medical assessment. Patient's wife states that a family member can accompany him to dialysis center to help reduce the risk and complications of agitation. Patient has responded well to Abilify and has had no reported significant agitation over the past 48 hours. -- Recommend a TOC consult  ## Behavioral / Environmental:  --  Delirium Precautions: Delirium Interventions for Nursing and Staff: - RN to open blinds every AM. - To Bedside: Glasses and pt's own shoes. Make available to patient when possible and encourage use. - Encourage po fluids when appropriate, keep fluids within reach. - OOB to chair with meals. - Passive ROM exercises to all extremities with AM & PM care. - RN to assess orientation to person, time and place QAM and PRN. - Recommend extended visitation hours with familiar family/friends as feasible. - Staff to minimize disturbances at night. Turn off television when pt asleep or when not in use.  - When patient is awake and watching TV, his wife reports that he likes watching western movies, racing, boxing, or [place]  ## Safety and Observation Level:  - Based on my clinical evaluation, I estimate the patient to be at low risk of self harm in the current setting - At this time, we recommend a routine level of observation. This decision is based on my review of the chart including patient's history and current presentation, interview of the patient, mental status examination, and consideration of suicide risk including evaluating suicidal ideation, plan, intent, suicidal or self-harm behaviors, risk factors, and protective factors. This judgment is based on our ability to directly address suicide risk, implement suicide prevention strategies and develop a safety plan  while the patient is in the clinical setting. Please contact our team if there is a concern that risk level has changed.  Suicide risk assessment: Patient has following modifiable risk factors for suicide: access to guns, which we are addressing by the patient's wife planning to lock up the guns prior to the patient returning home.   Patient has following non-modifiable or demographic risk factors for suicide: male gender and psychiatric hospitalization  Patient has the following protective factors against suicide: Supportive family, Supportive friends, and no history of suicide attempts  Thank you for this consult request. Recommendations have been communicated to the primary team.  We will sign off at this time.   Khamila Bassinger A Leonidas Boateng  Psychiatric and Social History   Relevant Aspects of Hospital Course:  Admitted on 02/15/23 for confusion and SOB in the context of a recent COVID infection. He was found to have sepsis secondary to peritoneal dialysis associated catheter peritonitis and polymicrobial bacteremia/fungemia. Broad spectrum antibiotics started. Hospital course complicated by worsening encephalopathy with hypoxemia requiring transfer to the ICU.   Patient Report:  Patient was less somnolent than last time I saw him; had to wake up a couple of times (inattention). He was at tail end of speech therapy - was superficially agreeing with things she was saying, but no real processing of information (ie after explaining role of PT/OT/ST he was asked to repeat discipines, was not able to, when asked what her role was  he read off "[name] [name]" from the badge instead of "speech therapy"). Was oriented to self only (thought he was in [place]). Some latency in his responses. Was only able to name 2/3 common items in his room. Often repeats answers to prior questions (ie when asked to count # of fingers up). Overall c/w significant cognitive impairment. In a moment of lucidity, says he is getting  agitated at dialysis due to "itching".   Psych ROS:  Today denied SI, HI, AH/VH both istoric and current. Not able to do detailed sx inventory.   Depression: In hospital, patient displays signs of sleep disturbance, fatigue, diminished concentration and cognition, and psychomotor changes. 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  Anxiety: Patient did not express or display concerns for anxiety, but was somnolent throughout interview Mania (lifetime and current): No reported symptoms of mania  Psychosis: (lifetime and current): Patient's wife reports that over the course of his hospital stay, at times the patient had hallucinations about deceased or living family members who are not currently in the room with him. None reported in the past 48 hours.  Collateral information:  Called wife today who consented to PRN hydroxyzine and vit C supplementation.   Contacted patient's wife, Shayon Trompeter, at 3:35 pm on 04/04/23. She states that she first noticed changes in the patient's mental status shortly after patient had COVID. She states that he tested positive for COVID at an urgent care in [place] on 01/28/23 and that around that time he began becoming more forgetful. On 02/14/23 the patient's wife observed that he thought he was in the bathroom when he was in the bedroom, and that he had recently been running a fever of 101.3 before she gave him a bath to cool him down, so she called an ambulance. He was transported to [facility] and subsequently transferred to [facility]. She reports that over the course of his 7-week hospital stay, he has become progressively worse. She reports that in the first few days of admission he was able to request specific television shows to watch, but that he has become progressively somnolent and has had periods with  hallucinations. She reports that he has at times called out to deceased or living relatives who were not in the room.   Psychiatric History:  Information collected from patient's wife and EMR  Prev Psych Dx/Sx: None Current Psych Provider: None Home Meds (current): None Previous Psych Med Trials: No past history Therapy: No past history  Prior ECT: Wife denies Prior Psych Hospitalization: Wife denies Prior Self Harm: Wife denies Prior Violence: Wife denies  Family Psych History: Wife states that there is no known psych history in the patient's family Family Hx suicide: Wife denies  Social History:  Developmental Hx: None on record Educational Hx: Deferred Occupational Hx: Patient worked as a Naval architect for 32 years Legal Hx: Wife states patient currently has no legal issues Living Situation: Lives with wife at home Spiritual Hx: Patient normally attends church every Sunday and participates in Sunday school Access to weapons: Wife states that there are currently guns in the home, but she plans to have them locked up prior to the patient returning home  Substance History Tobacco use: Wife states patient does not use tobacco Alcohol use: Wife states patient does not use alcohol Drug use: Wife states patient does not  use drugs   Exam Findings   Psychiatric Specialty Exam: Patient is oriented to self and generally arouses to the call of his first name. He is oriented to self only; attention and concentration are poor. He did not report a mood.  He has a relatively blunted affect. His speech is soft, and when he responds to questions, he only does so in 1-2 words, making his thought process (beyond being fairly concrete), insight, and judgement difficult to assess. He denies SI, HI, or AVH. He is cooperative throughout the interview and displays no signs of agitation.  Physical Exam: Vital signs:  Temp:  [97.8 F (36.6 C)-97.9 F (36.6 C)] 97.8 F (36.6 C) (10/01 2002) Pulse  Rate:  [69-88] 88 (10/02 0923) Resp:  [16-24] 18 (10/02 1307) BP: (106-187)/(79-95) 148/79 (10/02 1307) SpO2:  [88 %-96 %] 95 % (10/02 1307) Weight:  [90.8 kg] 90.8 kg (10/01 1755)  Physical Exam Constitutional:      Appearance: Normal appearance.  HENT:     Head: Normocephalic and atraumatic.  Eyes:     Conjunctiva/sclera: Conjunctivae normal.  Cardiovascular:     Rate and Rhythm: Normal rate.  Pulmonary:     Effort: Pulmonary effort is normal.    Blood pressure (!) 148/79, pulse 88, temperature 97.8 F (36.6 C), resp. rate 18, height 5\' 4"  (1.626 m), weight 90.8 kg, SpO2 95%. Body mass index is 34.36 kg/m.   Other History   These have been pulled in through the EMR, reviewed, and updated if appropriate.   Family History:  The patient's family history includes Arthritis in an other family member; Asthma in an other family member; Cancer in his mother; Diabetes in his father, mother, and sister; Hypertension in his father, mother, and sister; Lung disease in an other family member.  Medical History: Past Medical History:  Diagnosis Date   Anemia    Arthritis    Asthma    Bell palsy    CAD (coronary artery disease)    a. 2014 MV: abnl w/ infap ischemia; b. 03/2013 Cath: aneurysmal bleb in the LAD w/ otw nonobs dzs-->Med Rx.   Chronic back pain    Chronic knee pain    a. 09/2015 s/p R TKA.   Chronic pain    Chronic shoulder pain    Chronic sinusitis    COPD (chronic obstructive pulmonary disease) (HCC)    Diabetes mellitus without complication (HCC)    type II    ESRD on peritoneal dialysis (HCC)    on peritoneal dialysis, [facility]   Essential hypertension    GERD (gastroesophageal reflux disease)    Gout    Gout    Hepatomegaly    noted on noncontrast CT 2015   History of hiatal hernia    Hyperlipidemia    Lateral meniscus tear    Obesity    Truncal   Obstructive sleep apnea    does not use cpap    On home oxygen therapy    uses 2l when is going  somewhere per patient    PUD (peptic ulcer disease)    remote, reports f/u EGD about 8 years ago unremarkable    Reactive airway disease    related to exposure to chemical during 9/11   Sinusitis    Vitamin D deficiency     Surgical History: Past Surgical History:  Procedure Laterality Date   A/V FISTULAGRAM N/A 02/28/2023   Procedure: A/V Fistulagram;  Surgeon: Victorino Sparrow, MD;  Location: [facility] INVASIVE CV  LAB;  Service: Cardiovascular;  Laterality: N/A;   ASAD LT SHOULDER  12/15/2008   left shoulder   AV FISTULA PLACEMENT Left 08/09/2016   Procedure: BRACHIOCEPHALIC ARTERIOVENOUS (AV) FISTULA CREATION LEFT ARM;  Surgeon: Sherren Kerns, MD;  Location: [facility] OR;  Service: Vascular;  Laterality: Left;   CAPD INSERTION N/A 10/07/2018   Procedure: LAPAROSCOPIC PERITONEAL CATHETER PLACEMENT;  Surgeon: Rodman Pickle, MD;  Location: [facility] ORS;  Service: General;  Laterality: N/A;   CATARACT EXTRACTION W/PHACO Left 03/28/2016   Procedure: CATARACT EXTRACTION PHACO AND INTRAOCULAR LENS PLACEMENT LEFT EYE;  Surgeon: Jethro Bolus, MD;  Location: [facility] ORS;  Service: Ophthalmology;  Laterality: Left;  CDE: 4.77   CATARACT EXTRACTION W/PHACO Right 04/11/2016   Procedure: CATARACT EXTRACTION PHACO AND INTRAOCULAR LENS PLACEMENT RIGHT EYE; CDE:  4.74;  Surgeon: Jethro Bolus, MD;  Location: [facility] ORS;  Service: Ophthalmology;  Laterality: Right;   COLONOSCOPY  10/15/2008   Fields: Rectal polyp obliterated, not retrieved, hemorrhoids, single ascending colon diverticulum near the CV. Next colonoscopy April 2020   COLONOSCOPY N/A 12/25/2014   SLF: 1. Colorectal polyps (2) removed 2. Small internal hemorrhoids 3. the left colon is severely redundant. hyperplastic polyps   CORONARY ARTERY BYPASS GRAFT N/A 06/05/2022   Procedure: OFF PUMP CORONARY ARTERY BYPASS GRAFTING (CABG) X 2 BYPASSES USING LEFT INTERNAL MAMMARY ARTERY AND RIGHT LEG GREATER SAPHENOUS VEIN HARVESTED ENDOSCOPICALLY;  Surgeon: Corliss Skains, MD;  Location: [facility] OR;  Service: Open Heart Surgery;  Laterality: N/A;   CORONARY STENT INTERVENTION N/A 07/25/2021   Procedure: CORONARY STENT INTERVENTION;  Surgeon: Tonny Bollman, MD;  Location: [facility] INVASIVE CV LAB;  Service: Cardiovascular;  Laterality: N/A;   CORONARY STENT INTERVENTION N/A 12/26/2021   Procedure: CORONARY STENT INTERVENTION;  Surgeon: Yvonne Kendall, MD;  Location: [facility] INVASIVE CV LAB;  Service: Cardiovascular;  Laterality: N/A;   CORONARY STENT INTERVENTION N/A 01/20/2022   Procedure: CORONARY STENT INTERVENTION;  Surgeon: Tonny Bollman, MD;  Location: [facility] INVASIVE CV LAB;  Service: Cardiovascular;  Laterality: N/A;   DOPPLER ECHOCARDIOGRAPHY     ESOPHAGOGASTRODUODENOSCOPY N/A 12/25/2014   SLF: 1. Anemia most likely due to CRI, gastritis, gastric polyps 2. Moderate non-erosive gastriits and mild duodenitis.  3.TWo large gstric polyps removed.    EYE SURGERY  12/22/2010   tear duct probing-   FOREIGN BODY REMOVAL  03/29/2011   Procedure: REMOVAL FOREIGN BODY EXTREMITY;  Surgeon: Fuller Canada, MD;  Location: [facility] ORS;  Service: Orthopedics;  Laterality: Right;  Removal Foreign Body Right Thumb   IR FLUORO GUIDE CV LINE RIGHT  08/06/2018   IR FLUORO GUIDE CV LINE RIGHT  02/17/2023   IR US GUIDE VASC ACCESS RIGHT  08/06/2018   IR US GUIDE VASC ACCESS RIGHT  02/17/2023   KNEE ARTHROSCOPY  10/16/2007   left   KNEE ARTHROSCOPY WITH LATERAL MENISECTOMY Right 10/14/2015   Procedure: LEFT KNEE ARTHROSCOPY WITH PARTIAL LATERAL MENISECTOMY;  Surgeon: Vickki Hearing, MD;  Location: [facility] ORS;  Service: Orthopedics;  Laterality: Right;   LEFT HEART CATH AND CORONARY ANGIOGRAPHY N/A 07/25/2021   Procedure: LEFT HEART CATH AND CORONARY ANGIOGRAPHY;  Surgeon: Tonny Bollman, MD;  Location: [facility] INVASIVE CV LAB;  Service: Cardiovascular;  Laterality: N/A;   LEFT HEART CATH AND CORONARY ANGIOGRAPHY N/A 12/26/2021   Procedure: LEFT HEART CATH AND CORONARY ANGIOGRAPHY;   Surgeon: Yvonne Kendall, MD;  Location: [facility] INVASIVE CV LAB;  Service: Cardiovascular;  Laterality: N/A;   LEFT HEART CATH AND CORONARY ANGIOGRAPHY N/A 01/20/2022  Procedure: LEFT HEART CATH AND CORONARY ANGIOGRAPHY;  Surgeon: Tonny Bollman, MD;  Location: [facility] INVASIVE CV LAB;  Service: Cardiovascular;  Laterality: N/A;   LEFT HEART CATHETERIZATION WITH CORONARY ANGIOGRAM N/A 03/28/2013   Procedure: LEFT HEART CATHETERIZATION WITH CORONARY ANGIOGRAM;  Surgeon: Marykay Lex, MD;  Location: [facility] CATH LAB;  Service: Cardiovascular;  Laterality: N/A;   NM MYOVIEW LTD     PENILE PROSTHESIS IMPLANT N/A 08/16/2015   Procedure: PENILE PROTHESIS INFLATABLE, three piece, Excisional biopsy of Penile ulcer, Penile molding;  Surgeon: Jethro Bolus, MD;  Location: [facility] ORS;  Service: Urology;  Laterality: N/A;   PENILE PROSTHESIS IMPLANT N/A 12/24/2017   Procedure: REMOVAL AND  REPLACEMENT  COLOPLAST PENILE PROSTHESIS;  Surgeon: Crista Elliot, MD;  Location: [facility] ORS;  Service: Urology;  Laterality: N/A;   PERIPHERAL VASCULAR BALLOON ANGIOPLASTY  02/28/2023   Procedure: PERIPHERAL VASCULAR BALLOON ANGIOPLASTY;  Surgeon: Victorino Sparrow, MD;  Location: [facility] INVASIVE CV LAB;  Service: Cardiovascular;;   PORT-A-CATH REMOVAL N/A 02/19/2023   Procedure: REMOVAL OF PERITONEAL DIALYSIS CATHETER;  Surgeon: Victorino Sparrow, MD;  Location: [facility] OR;  Service: Vascular;  Laterality: N/A;   QUADRICEPS TENDON REPAIR  07/21/2011   Procedure: REPAIR QUADRICEP TENDON;  Surgeon: Fuller Canada, MD;  Location: [facility] ORS;  Service: Orthopedics;  Laterality: Right;   RIGHT/LEFT HEART CATH AND CORONARY ANGIOGRAPHY N/A 05/30/2022   Procedure: RIGHT/LEFT HEART CATH AND CORONARY ANGIOGRAPHY;  Surgeon: Corky Crafts, MD;  Location: [facility] INVASIVE CV LAB;  Service: Cardiovascular;  Laterality: N/A;   TEE WITHOUT CARDIOVERSION N/A 06/05/2022   Procedure: TRANSESOPHAGEAL ECHOCARDIOGRAM (TEE);  Surgeon: Corliss Skains, MD;   Location: [facility] OR;  Service: Open Heart Surgery;  Laterality: N/A;   TOENAIL EXCISION     removed x2-bilateral   UMBILICAL HERNIA REPAIR  07/17/2005   [place]    Medications:   Current Facility-Administered Medications:    (feeding supplement) PROSource Plus liquid 30 mL, 30 mL, Oral, BID BM, Stovall, Kathryn R, PA-C, 30 mL at 04/18/23 0928   0.9 %  sodium chloride infusion, , Intravenous, PRN, Angelina Sheriff, DO, Last Rate: 10 mL/hr at 04/18/23 1515, New Bag at 04/18/23 1515   acetaminophen (TYLENOL) tablet 325-650 mg, 325-650 mg, Oral, Q4H PRN, Milinda Antis, PA-C, 650 mg at 04/12/23 2036   ARIPiprazole (ABILIFY) tablet 7.5 mg, 7.5 mg, Oral, QHS, Fanny Dance, MD, 7.5 mg at 04/17/23 2313   [START ON 05/02/2023] ascorbic acid (VITAMIN C) tablet 500 mg, 500 mg, Oral, Daily, Angelina Sheriff, DO   aspirin EC tablet 81 mg, 81 mg, Oral, Daily, Milinda Antis, PA-C, 81 mg at 04/18/23 1914   atorvastatin (LIPITOR) tablet 80 mg, 80 mg, Oral, Daily, Milinda Antis, PA-C, 80 mg at 04/18/23 7829   bisacodyl (DULCOLAX) EC tablet 5 mg, 5 mg, Oral, Daily PRN, Valetta Fuller, Lynnell Jude, PA-C   Chlorhexidine Gluconate Cloth 2 % PADS 6 each, 6 each, Topical, Q0600, Tomasa Blase, PA-C   [START ON 04/19/2023] Chlorhexidine Gluconate Cloth 2 % PADS 6 each, 6 each, Topical, Q0600, Penninger, Lillia Abed, PA   Darbepoetin Alfa (ARANESP) injection 150 mcg, 150 mcg, Subcutaneous, Q Sun-1800, Paytes, Austin A, RPH, 150 mcg at 04/15/23 1959   [START ON 04/19/2023] dextrose 5 % 100 mL with ascorbic acid (VITAMIN C) 200 mg infusion, , Intravenous, Q T,Th,Sat-1800, Engler, Morgan C, DO   divalproex (DEPAKOTE SPRINKLE) capsule 125 mg, 125 mg, Oral, Q8H, Fanny Dance, MD, 125 mg at 04/18/23 1246  feeding supplement (ENSURE ENLIVE / ENSURE PLUS) liquid 237 mL, 237 mL, Oral, BID WC, Setzer, Lynnell Jude, PA-C, 237 mL at 04/18/23 1024   Gerhardt's butt cream 1 Application, 1 Application, Topical, QID, Setzer, Lynnell Jude, PA-C, 1 Application at 04/18/23 1314   guaiFENesin-dextromethorphan (ROBITUSSIN DM) 100-10 MG/5ML syrup 10 mL, 10 mL, Oral, Q6H PRN, Milinda Antis, PA-C, 10 mL at 04/03/23 1633   hydrOXYzine (ATARAX) tablet 10 mg, 10 mg, Oral, TID PRN, Daishia Fetterly A   lidocaine (LIDODERM) 5 % 1 patch, 1 patch, Transdermal, Q2200, Setzer, Lynnell Jude, PA-C, 1 patch at 04/12/23 2035   lidocaine (LIDODERM) 5 % 1 patch, 1 patch, Transdermal, Q24H, Skip Mayer A, MD, 1 patch at 04/15/23 2000   mometasone-formoterol (DULERA) 200-5 MCG/ACT inhaler 2 puff, 2 puff, Inhalation, BID, Setzer, Lynnell Jude, PA-C, 2 puff at 04/17/23 1610   Muscle Rub CREA, , Topical, PRN, Fanny Dance, MD   ondansetron [facility] tablet 4 mg, 4 mg, Oral, Q6H PRN, 4 mg at 03/16/23 2154 **OR** ondansetron (ZOFRAN) injection 4 mg, 4 mg, Intravenous, Q6H PRN, Setzer, Lynnell Jude, PA-C, 4 mg at 04/03/23 1603   oxyCODONE (Oxy IR/ROXICODONE) immediate release tablet 2.5 mg, 2.5 mg, Oral, TID PRN, Elijah Birk C, DO, 2.5 mg at 04/15/23 1122   pantoprazole (PROTONIX) EC tablet 40 mg, 40 mg, Oral, BID, Setzer, Lynnell Jude, PA-C, 40 mg at 04/18/23 9604   traZODone (DESYREL) tablet 50 mg, 50 mg, Oral, QHS PRN, Ranelle Oyster, MD, 50 mg at 04/12/23 2037   traZODone (DESYREL) tablet 50 mg, 50 mg, Oral, QHS, Ranelle Oyster, MD, 50 mg at 04/17/23 2313  Allergies: Allergies  Allergen Reactions   Opana [Oxymorphone Hcl] Itching   Tramadol Itching

## 2023-04-18 NOTE — Patient Care Conference (Signed)
Inpatient RehabilitationTeam Conference and Plan of Care Update Date: 04/17/2023   Time: 10:35 AM    Patient Name: [name] [name]      Medical Record Number: [id number]  Date of Birth: 12/26/1950 Sex: Male         Room/Bed: 4W13C/4W13C-01 Payor Info: Payor: MEDICARE / Plan: MEDICARE PART A AND B / Product Type: *No Product type* /    Admit Date/Time:  03/12/2023  4:25 PM  Primary Diagnosis:  Acute metabolic encephalopathy  Hospital Problems: Principal Problem:   Acute metabolic encephalopathy Active Problems:   Type 2 diabetes mellitus with chronic kidney disease on chronic dialysis, with long-term current use of insulin (HCC)   HFrEF (heart failure with reduced ejection fraction) (HCC)   Morbid obesity (HCC)   Encephalopathy   Epistaxis   PAF (paroxysmal atrial fibrillation) (HCC)   Long term (current) use of insulin (HCC)   ESRD on dialysis (HCC)   Debility   Agitation   Hematuria    Expected Discharge Date: Expected Discharge Date:  (D/C pending)  Team Members Present: Physician leading conference: Dr. Elijah Birk Social Worker Present: Lavera Guise, BSW Nurse Present: Chana Bode, RN PT Present: Darrold Span, PT OT Present: Kearney Hard, OT SLP Present: Feliberto Gottron, SLP PPS Coordinator present : Edson Snowball, PT     Current Status/Progress Goal Weekly Team Focus  Bowel/Bladder   continent of b/b   remain continent   offer toileting q4hrs and PRN    Swallow/Nutrition/ Hydration               ADL's   mod to max A pending his behaviors, waxes and wanes   mod A overall   barriers are his agitation and behaviors, poor safety awareness    Mobility   min/modA overall, increased lethargy and agitation   minA ambulatory household distances, WC for community  barriers: increased agitation and lethargy, focus on gait training, global strengthening and endurance, following verbal cues    Communication                Safety/Cognition/  Behavioral Observations  Mod-Max A overall for cognition but can fluctuate depending on arousal, etc   Mod A   orientation with use of memory aids, basic problem solving, safety awareness    Pain   no c/o pain   pain will remain pain free   assess pain qshift and PRN    Skin   skin is intact   skin remain intact  assess skin for signs of breakdown qshift and PRN      Discharge Planning:  D/C to SNF reamins, spouse unable to provide 24/7 care due to working 2x a week and other responsibilties. SNF referral has been sent, behaviors continue. D/C pending and tentative to behaviors and behaviots do not allow patient to be appropriate for OP HD. SW will continue to follow.   Team Discussion: Patient's functional/cognitive status waxes and wanes; agitation at times. Hematuria resolved.  Patient on target to meet rehab goals: no, currently min- mod assist overall and mod - max assist for cognition.  Able to ambulate up to 36' on a good day but is not consistent.   *See Care Plan and progress notes for long and short-term goals.   Revisions to Treatment Plan:  Palliative care following Trial bedside HD Downgraded goals for discharge; change to care giver training   Teaching Needs: Safety, medications, dietary modifications, transfers, toileting, etc.   Current Barriers to Discharge: Decreased caregiver support,  Home enviroment access/layout, Insurance for SNF coverage, Hemodialysis, and Behavior  Possible Resolutions to Barriers: Family education DME: Psychiatrist Current Status: medically complicated by aggitation, cognitive deficits, ESRD on HD, pain control, hypertension/hypotension, bradycardia, diabetes and hematuria  Barriers to Discharge: Behavior/Mood;Cardiac Complications;Electrolyte abnormality;Hypotension;Incontinence;Medical stability;Morbid Obesity;Renal Insufficiency/Failure;Self-care education;Uncontrolled Pain;Uncontrolled Diabetes;Symptomatic  Anemia  Barriers to Discharge Comments: aggitation interfering with dialysis tolerance, cognitive deficits, pain control, hypotension Possible Resolutions to [employer] Focus: adjusting psychiatric medications and psych input to stabilize mood, dialysis with family assistance to remain calm, palliative care consultation   Continued Need for Acute Rehabilitation Level of Care: The patient requires daily medical management by a physician with specialized training in physical medicine and rehabilitation for the following reasons: Direction of a multidisciplinary physical rehabilitation program to maximize functional independence : Yes Medical management of patient stability for increased activity during participation in an intensive rehabilitation regime.: Yes Analysis of laboratory values and/or radiology reports with any subsequent need for medication adjustment and/or medical intervention. : Yes   I attest that I was present, lead the team conference, and concur with the assessment and plan of the team.   Chana Bode B 04/18/2023, 2:43 PM

## 2023-04-18 NOTE — Progress Notes (Signed)
   Palliative Medicine Inpatient Follow Up Note HPI: 72 y.o. male with past medical history of CAD, PUD, DM, OAS, HLD, ESRD who was on PD which was removed on 02/19/23, LUE AVF in place, IR was consulted for image guided tunneled HD cath placement.   Today's Discussion 04/18/2023  *Please note that this is a verbal dictation therefore any spelling or grammatical errors are due to the "[employer]" system interpretation.  Chart reviewed inclusive of vital signs, progress notes, laboratory results, and diagnostic images. Still requiring intermittent IV doses of ativan.   I met with Carter at bedside this morning. He is at the nursing station. He is awake and alert to self. He expresses the desire to use the restroom. Patient RN alerted.  Per discussion with Terance's RN, Lurena Joiner - he had a very agitated and restless night. He also required IV ativan yesterday at dialysis.  The plan remains to try inpatient HD at bedside tomorrow with his wife present.   Questions and concerns addressed/Palliative Support Provided.   Objective Assessment: Vital Signs Vitals:   04/17/23 2002 04/18/23 0923  BP: (!) 140/87 (!) 154/93  Pulse: 88 88  Resp: 16 18  Temp: 97.8 F (36.6 C)   SpO2: (!) 88% 96%    Intake/Output Summary (Last 24 hours) at 04/18/2023 1222 Last data filed at 04/18/2023 0957 Gross per 24 hour  Intake 400 ml  Output 1500 ml  Net -1100 ml   Last Weight  Most recent update: 04/17/2023  5:56 PM    Weight  90.8 kg (200 lb 2.8 oz)            Gen:  Elderly AA M in NAD HEENT: moist mucous membranes CV: Regular rate and rhythm  PULM: On RA, breathing is even and nonlabored ABD: soft/nontender  EXT: No edema  Neuro: Oriented to self, drowsy  SUMMARY OF RECOMMENDATIONS   DNAR/DNI  Goal to continue iHD --> though this is inhibited by agitational behaviors. Patient is being premedicated prior to treatment.  --> Plan for iHD at bedside on Thursday and additional conversation  from there  Allowing time for outcomes  Appreciate ongoing Chaplain support  Ongoing Palliative care support   Time: 25 ______________________________________________________________________________________ Lamarr Lulas [place] Palliative Medicine Team Team Cell Phone: [phone] Please utilize secure chat with additional questions, if there is no response within 30 minutes please call the above phone number  Palliative Medicine Team providers are available by phone from 7am to 7pm daily and can be reached through the team cell phone.  Should this patient require assistance outside of these hours, please call the patient's attending physician.

## 2023-04-18 NOTE — Progress Notes (Signed)
Occupational Therapy Session Note  Patient Details  Name: [name] [name] MRN: [id number] Date of Birth: Feb 08, 1951  Today's Date: 04/18/2023 OT Individual Time: 4098-1191 OT Individual Time Calculation (min): 77 min    Short Term Goals: Week 2:  OT Short Term Goal 1 (Week 2): Patient will complete toilet transfer with min A of 1 OT Short Term Goal 1 - Progress (Week 2): Met OT Short Term Goal 2 (Week 2): Patient will complete LB dressing with min A OT Short Term Goal 2 - Progress (Week 2): Progressing toward goal OT Short Term Goal 3 (Week 2): Patient will complete UB dressing with min A OT Short Term Goal 3 - Progress (Week 2): Met Week 8:   Week 9:    Skilled Therapeutic Interventions/Progress Updates:    1:! Pt received asleep in the bed. Pt required extra time to wake up and required min to mod A with more than reasonable amt of time to come to EOB. Pt continued to keep his eyes closed. Attempted to walk to the bathroom but required mod to max A to maintain safe balance; pt reports feeling drunk and dizzy when up on his feet. Pt with difficulty turning and sitting in chair at the sink requiring max A for controlled decent into the w/c. Stand pivot transfer into the shower with mod A with extra time to transition into shower chair and sat on the edge of the bench. Pt REquired total A to bathe due to decr attention to task and distraction. Pt continued to keep his eyes closed. Cued for UB dressing with placing shirt over head but pt unable to don shirt from that- requiring total A. Mod a with max to total cues for sit to stand and LB dressing. Pt with shaking / tremors in right UE and LE - unable to feed self requiring total  A and mod A to manage cup. Pt left with nursing at end of session.   Therapy Documentation Precautions:  Precautions Precautions: Fall Restrictions Weight Bearing Restrictions: No General:   Vital Signs: Therapy Vitals Resp: 18 BP: (!) 148/79 Pain: Pain  Assessment Pain Scale: 0-10 Pain Score: 0-No pain    Therapy/Group: Individual Therapy  [name] [facility] 04/18/2023, 1:34 PM

## 2023-04-19 ENCOUNTER — Inpatient Hospital Stay (HOSPITAL_COMMUNITY): Payer: Medicare Other

## 2023-04-19 LAB — GLUCOSE, CAPILLARY: Glucose-Capillary: 141 mg/dL — ABNORMAL HIGH (ref 70–99)

## 2023-04-19 MED ORDER — LORAZEPAM 0.5 MG PO TABS
0.5000 mg | ORAL_TABLET | ORAL | Status: AC
  Administered 2023-04-19: 0.5 mg via ORAL
  Filled 2023-04-19: qty 1

## 2023-04-19 NOTE — Progress Notes (Signed)
PROGRESS NOTE   Subjective/Complaints: Had as needed asthma Atarax and Ativan 0.5 mg overnight at 2100.  No documentation of agitation or events.    This a.m., complaining of chest pain with therapies, EKG normal, patient then asleep at nurses station with some mild nausea.  Otherwise vitally stable.   Labs yesterday unremarkable except for Depakote level 18.  P.o. intake seem to be improved, 30 to 95% now.  BM 10-2, large, incontinent bowel and bladder.  Normotensive this a.m., vitally stable.  On exam, doing better, no further complaints of chest pain.  No dizziness, nausea, or discomfort at this time.  He does have a mild nonproductive cough, he thinks this started yesterday, does not want any treatment for it but is agreeable to a chest x-ray today.  No shortness of breath.  Spoke a little bit about his prior job as a [employer], he was able to hold conversation appropriately.  ROS: As per HPI above  Objective:   No results found. Recent Labs    04/18/23 1001  WBC 4.3  HGB 10.1*  HCT 33.6*  PLT 220       Recent Labs    04/18/23 1001  NA 134*  K 3.9  CL 94*  CO2 25  GLUCOSE 118*  BUN 36*  CREATININE 9.50*  CALCIUM 9.2        Intake/Output Summary (Last 24 hours) at 04/19/2023 0957 Last data filed at 04/19/2023 0933 Gross per 24 hour  Intake 240 ml  Output --  Net 240 ml           Physical Exam: Vital Signs Blood pressure 118/70, pulse 78, temperature 97.7 F (36.5 C), resp. rate 18, height 5\' 4"  (1.626 m), weight 90.8 kg, SpO2 94%.   General: No acute distress.  Patient sitting upright in wheelchair in gestation. Mood and affect are mildly flat, overall appropriate.   Heart: Regular rate and rhythm no rubs murmurs or extra sounds Lungs: Clear to auscultation, breathing unlabored, no rales or wheezes.  Mild, wet sounding cough, without production.  No apparent respiratory  distress. Abdomen: Positive bowel sounds, soft nontender to palpation, nondistended Extremities: No clubbing, cyanosis, or edema Skin: No evidence of breakdown, no evidence of rash. + V intact Neurologic: AAO to self, consistently states this is [facility], is oriented to month and year.  Improved from prior exams.   + Cognitive deficits, difficulty with recent memory.  Better with distant memory, recall.   Lethargic, but more attentive today cranial nerves II through XII intact, motor strength is moving all 4 extremities with considerable encouragement, 5- out of 5 throughout  Response to light touch in all 4 extremities Mild resting right upper extremity tremor, which was consistent with prior exams.  No more jerking motions. Musculoskeletal: no pain with range of motion in all 4 extremities. No joint swelling       Assessment/Plan: 1. Functional deficits which require 15 hr per week of interdisciplinary therapy in a comprehensive inpatient rehab setting. Physiatrist is providing close team supervision and 24 hour management of active medical problems listed below. Physiatrist and rehab team continue to assess barriers to discharge/monitor patient progress toward functional  and medical goals  Care Tool:  Bathing    Body parts bathed by patient: Right arm, Left arm, Chest, Abdomen, Front perineal area, Right upper leg, Buttocks, Left upper leg, Right lower leg, Left lower leg, Face   Body parts bathed by helper: Right arm, Left arm, Chest, Abdomen, Front perineal area, Buttocks, Right upper leg, Left upper leg, Right lower leg, Left lower leg, Face     Bathing assist Assist Level: Total Assistance - Patient < 25%     Upper Body Dressing/Undressing Upper body dressing   What is the patient wearing?: Pull over shirt    Upper body assist Assist Level: Total Assistance - Patient < 25%    Lower Body Dressing/Undressing      Lower body dressing      What is the  patient wearing?: Underwear/pull up, Pants     Lower body assist Assist for lower body dressing: Total Assistance - Patient < 25%     Toileting Toileting    Toileting assist Assist for toileting: Maximal Assistance - Patient 25 - 49%     Transfers Chair/bed transfer  Transfers assist     Chair/bed transfer assist level: Moderate Assistance - Patient 50 - 74%     Locomotion Ambulation   Ambulation assist      Assist level: Minimal Assistance - Patient > 75% Assistive device: No Device Max distance: 175'   Walk 10 feet activity   Assist     Assist level: Contact Guard/Touching assist Assistive device: No Device   Walk 50 feet activity   Assist Walk 50 feet with 2 turns activity did not occur: Safety/medical concerns  Assist level: Minimal Assistance - Patient > 75% Assistive device: No Device    Walk 150 feet activity   Assist Walk 150 feet activity did not occur: Safety/medical concerns  Assist level: Minimal Assistance - Patient > 75% Assistive device: No Device    Walk 10 feet on uneven surface  activity   Assist Walk 10 feet on uneven surfaces activity did not occur: Safety/medical concerns         Wheelchair     Assist Is the patient using a wheelchair?: Yes Type of Wheelchair: Manual    Wheelchair assist level: Dependent - Patient 0%      Wheelchair 50 feet with 2 turns activity    Assist        Assist Level: Dependent - Patient 0%   Wheelchair 150 feet activity     Assist      Assist Level: Dependent - Patient 0%   Blood pressure 118/70, pulse 78, temperature 97.7 F (36.5 C), resp. rate 18, height 5\' 4"  (1.626 m), weight 90.8 kg, SpO2 94%.  Medical Problem List and Plan: 1. Functional deficits secondary to metabolic encephalopathy due to sepsis secondary to catheter peritonitis and hypoxemia              -patient may not shower             -ELOS/Goals: 16-18 days, PT/OT Sup, SLP min A -tentative  discharge date 9-9           -Continue CIR therapies including PT, OT, and SLP   -Will review records from [facility] -- has worsened since approximately 8/22, possible 8/19, consistent with records from nursing at [facility] with current behavior.   -Therapy QD schedule  Con't CIR PT, OT and SLP  Patient was seen by palliative care, changed to DNR status  - 9/30: Discussed with  Nephrology regarding setup so wife can be at bedside during dialysis; if able to keep him calm, could accommodate as OP. If unable to keep him calm, may need to consider hospice care.  Plan to trial this in room at bedside on Thursday.   2.  Antithrombotics: -DVT/anticoagulation:  Pharmaceutical: Heparin, 03/23/23 changed to Lovenox  - Dced d/t ?; will hold off d/t recurrent falls and ongoing agitation.             -antiplatelet therapy: aspirin and Plavix   3. Pain Management: Tylenol as needed             -Lidoderm patches to back  -Elavil 25 mg at bedtime for nerve pain and sleep 8/27  -8/29: Pain well controlled on Elavil; DC lyrica, PRN Tylenol/oxy 2.5-5 mg d/t CKD  8-30: Appears patient has been getting pain medications together overnight along with antipsychotics, will discuss with nursing  8-31: Patient having an difficulty endorsing needs and not utilizing PRNs, scheduled oxycodone 5 mg 3 times daily and has 2.5 mg prn 3 times daily available.   9-1: Not utilizing PRNs, minimal complaints of pain, will discontinue scheduled given lethargy today..  -muscle rub R shoulder  9/16 patient reports pain is under control overall   9/26-10-2 no report of pain noted  4. Mood/Behavior/Sleep: LCSW to evaluate and provide emotional support             -continue Seroquel 50 mg TID             -clonazepam 0.5 mg TID prn             -Geodon 10 mg IM q 12 hours prn             -antipsychotic agents: n/a  -8-27: Agitated by soft restraints in bed, transition to 4 side rails, Posey alarm, and TeleSitter for now.  Veil bed on  standby if needed, however feel we should avoid this if possible due to likelihood of agitating patient further   - 8/28: Placed in veil bed; wrist restraints for dialysis. Scheduled Geodon 20 mg twice daily; discontinue Seroquel and clonazepam due to no perceivable benefit. Add as needed Zyprexa 5 mg twice daily p.o. or IM due to recent cardiac arrhythmias and concern for QTc prolongation. As needed ramelteon for sleep. Stop gabapentin due to possible side effect of agitation  - 8/28: No improvement except temporarily with Geodon; add depakote 125 mg q8h for ongoing aggitation, mood stabilization. Increase elavil to 50 mg at bedtime.  Workup ongoing.    - 8/29: EEG negative, labs normal. MRI pending. Sleeping well and calm since starting depakote and increase in elavil. Will monitor tonight and wean Geodon in AM if appropriate.   8-30: MRI showing chronic microvascular changes, otherwise normal.  Increasingly calm and appropriate during the day, with ongoing agitation at nighttime.  Will increase nighttime Geodon to 40 mg; will discuss sleep log with nursing, as it has not been performed.  8-31 no sleep overnight per sleep log, again getting agitated around 9 PM and 5 AM.  Not responsive to as needed Zyprexa, responsive to Ativan.  Attempted to wean off scheduled a.m. Geodon today, but patient became extremely agitated and unable to redirect, calm down with readministration of medication.  Resume Geodon 20 mg every morning/40 mg every afternoon, increased Elavil to 75 mg nightly, adding trazodone 50 mg nightly as needed.  P.o./IV Ativan 0.5 mg every 6 hours for agitation.  9-1: slept well overnight with  trazodone and increased Elavil, no PRNs used.  Unfortunately, oversedated today.  Discontinue scheduled pain medications as above, start wean of Elavil to 50 mg x2 days -> 25 mg x2 days -> 10 mg x2 days, off due to no apparent benefit and no complaints of pain.  Reduce Geodon from 20 mg every morning/40 mg  every evening to 20 mg twice daily.  Continue trazodone as needed for sleep, as this seems to be the difference for a restful night yesterday.  If he remains overly sedated, would dose reduce and DC Depakote next.  Keep Ativan as needed d/t dialysis not having other options available.  9/2-3--non agitated but still sleepy   -dc'ed depakote as I doubt it's doing much at this dose anyway   -continue geodon at 20mg  bid   -scheduled trazodone at HS with back up dose if needed    -sleep was intermittent last night   -requires enclosure bed for safety  9/4 continue enclosure bed for now, consider trying DC of this device early next week  -9/5 appears to be doing better overall this morning, continues to have intermittent agitation at times.  Continue enclosure bed  -9/6 agitation appears to be improving, patient is skeptical about medications and has been declining some of these.  Will discontinue multivitamin, RisaQuad to try to decrease pill burden  -9/7 continues to have intermittent episodes of agitation however overall improving, appears to be taking most of his medications  9/9 agitation and mental status appears to be improving -discussing discontinuation of enclosure bed with team 9/10 Geodon dose increased to 40 mg at night, continue 20 mg in the morning 9/11 No acute events noted, will check with team regarding DC enclosure bed, nursing reports he did ok last night but had dialysis 9/12 continues to have sundowning/waxing waning confusion and agitation, continue enclosure bed for now, will caution with Geodon will continue current dose but hold off on any escalation as this can contribute to bradycardia 9/16 no longer using enclosure bed, continue Geodon 20 mg twice daily for now due to intermittent agitation 9/17 patient with agitation and confusion at night, has sedation during the day.  Discussed with psychiatry Dr. Gasper Sells.  Will discontinue Geodon and try Abilify tonight.  Dr. Gasper Sells  also suggested considering donepezil to shorten hallucinations of delirium.  Psychiatry consult placed, will see tomorrow.  Appreciate assistance 9/18 no behavioral related notes overnight, psychiatry plans to see him today.  Appreciate assistance 9/19 patient was seen by psychiatry yesterday, recommended continue Abilify, consider donepezil or memantine, suggested avoiding stimulants.  Will continue current regimen for now as it appears he is doing better.  He appears to be less sedated and did not see frequent notes of agitation but this regimen thus far. 9/20 recheck CMP/ammonia/VPA level today due to continued drowsiness  9/21- Depakote level 16- too low- Ammonia 19- low- asleep this AM, but haven't been called today about sedation/lethargy.  9/22- more hallucinations per nursing- labs attempted- couldn't get- couldn't get U/A results since so much hematuria. Trying to get labs, to see if getting ill vs missing HD? 9/23 will go back to 3 times daily for Depakote to help avoid agitation 9/24 continue current regimen with Abilify and start memantine 5 mg daily 9/27 increase Abilify to 7.5 mg daily to help reduce agitation 9/30: No reported events, continue current regimen 10-1: Agitated overnight, got both p.o. and IV Ativan.  Likely lethargic today from this.  Monitor for neurologic changes. 10-2: IM Ativan again  overnight, lethargic today.  Blood glucose 70s on check, jerking and affect seem to improve mildly with food.  Did become agitated with attempted IV today.  To avoid oversedation, adjusted PRNs to Atarax first line as this seemed effective today, Ativan 0.5 mg daily as needed as second line p.o. on floor or IV with dialysis.  Discontinue Namenda, as confusion and increased agitation started around when this was started. 10-3: Ativan 0.5 mg and Atarax overnight, cognitively seems a little better today.  No behavioral issues at this time.  5. Neuropsych/cognition: This patient is not capable  of making decisions on his own behalf   -recommend neuropsych consult   - 8/27: Ongoing aggitation; Consult hospitalist for further workup on potential contributors to encephalopathy. Pending UA, LA 2.1-> 1.4; otherwise negative so far -Delirium precautions, sleep log, Veiled bed for safety and to minimize restraints -Wife has expressed that she is not interested in palliative care at this time; hopefully with above measures patient can get dialysis in the next 48 hours to limit worsening uremic encephalopathy and medical decline    - 8/29: Aggitation much improved, able to get some dialysis last night and today. See above for medication changes. Calling wife tonight to discuss.   8-30: Still with moderate cognitive deficits, however appears once patient is less agitated he is better able to concentrate and has better orientation.  MRI negative for acute findings, monitor.  9/3 agitation better. More sedated than anything at this point. Confused as well.    -depakote stopped   -continue other meds as rx'ed  9/21- pt refused Depakote this AM "since doesn't have seizures".   9/30: compliant with depakote over weekend; calm, alert, oriented; continue current regimen  10-1: Agitated overnight, does better with wife at bedside per reports.  10-2: Reconsult psychiatry given increasing agitation despite medication adjustments.  Repeat VPA levels (18).  CMP CBC look okay.  See their note for full recommendations, starting vitamin C repletion.    6. Skin/Wound Care: Routine skin care checks             -sacral skin off-loading/protection   7. Fluids/Electrolytes/Nutrition: Strict Is and Os and follow-up chemistries per nephrology             -renal diet/ 1200 cc FR  - Renvela 800 mg 3 times daily for hyperphosphatemia added per nephrology 8-27   9/21- Last Cr 7.07 and BUN 22 8: Hypotension: monitor TID and prn             -continue midodrine as needed during dialysis  Normotensive, monitor  9-1:  Consistently 90s over 60s, has been stable but given findings of microvascular changes on MRI, may be too low for patient to perfuse properly.  Will add midodrine 5 mg tid and monitor.  DC Coreg as heart rate has been low to normal; may resume at low-dose once blood pressure improved.   9/2 continue with ccurrent regimen. Bp's hr remain low   -elavil wean should help  9/9-10 BP stable, continue midodrine and monitor  9/13 DC midodrine today as BP has been higher, can restart if needed  Improved, cont to monitor  10-2: Increasing hypertension over the last few days, does tend to follow with dialysis so will monitor for now and add low-dose Norvasc if no improvement by tomorrow  10-3: Lower overall today,    04/19/2023    6:05 AM 04/18/2023    1:07 PM 04/18/2023    9:23 AM  Vitals with BMI  Systolic 118 148 161  Diastolic 70 79 93  Pulse 78  88      9: Hyperlipidemia: continue statin   10: CAD s/p CABG 2023: on DAPT and statin.  - troponin elevated but stable, monitor 8/27   11: DM-2: A1c = 6.3% (at home on Lantus 22 units at bedtime, Tradjenta 5 mg daily)        -monitor PO intake             -continue SSI (0-6 units) does not appear to be requiring coverage   - 9/6 DC SSI, change CBGs to daily  9/8 discontinue CBGs  9/16 glucose still controlled on BMP yesterday   9/23 glucose 100 on BMP yesterday   - 10-2: Hypoglycemic today, 70s, improved with p.o. intakes, 88 at recheck.  Likely due to poor p.o. intakes last few days.  10-3: P.o. intake takes appear improving, however will check blood glucose twice daily over next 2 to 3 days to ensure no further lows Recent Labs    04/18/23 0909 04/18/23 0937 04/19/23 1117  GLUCAP 73 88 141*     12: ESRD: HD on Tues, Thurs, Sat -nephrology made aware   - Unable to tolerate 8/27 d/t aggitation, stopped early 8/28;  - attempting again 8/29, stopped early again -Per nephrology, redirectable with gum -Have to switch restraints during  dialysis sessions from veil bed to regular bed, then resume veil bed order once at rehab -9/9 tolerating HD better 9/18 patient's wife indicates she would be able to stay with him during dialysis after discharge   9/22- pt refused/too agitated ot get HD yesterday  9/24 nephrology does not feel like he is a candidate for outpatient dialysis at this time due to agitation  9/27 will attempt hemodialysis in the mornings, premedicated to help reduce risk of agitation  9/30: See above, will try wife at bedside with dialysis   13: COPD/OSA; chronic home 2L O2 (Symbicort, Ventolin, prednisone 40 mg daily at home)             -continue symbicort  -Added home oxygen back 8-27   14: GERD: continue Protonix   15: Non-sustained VT/pAF/bradycardia: per cardiology, Dr. Algie Coffer: -continue carvedilol 6.25 mg BID; consider increasing to 12.5 mg BID before starting low dose amiodarone  -consider Eliquis and discontinue asa and Plavix in one week if no active GI bleeding and stable hemoglobin from 8/24 -hemoglobin has remained stable but very low around 7, no recurrent A-fib, monitor for now given agitation/fall 8-31 and consider initiating Eliquis when safe -Continue mag-ox to avoid hypomagnesemia -8-27: Single instance of bradycardia this morning 40s, may have been missed read, monitor through today and do not further adjust beta-blocker medications  -9-3 hr controlled after holding coreg -9/4-5 HR stable, continue to monitor -9/9 consider Eliquis this week if agitation continues to improve -9/10 will hold off on Eliquis due to sundowning at night, will see how he does with increase Geodon -9/11 EKG for bradycardia episode today, 2nd degree A-V block mobitz type 1.  Continue to hold beta blocker,9/11 will restart eliquis at 2.5 mg BID dose. Will consult cardiology regarding need for ASA/plavix and coreg.  Dr. Elease Hashimoto does not appear convinced that he has A-fib 9/12 appreciate cardiology consult yesterday, DAPT  transition to daily aspirin 81 only and Eliquis was discontinued, caution with Geodon as this can contribute to bradycardia 9/13 will decrease Geodon nighttime dose to 20 due to episodes of bradycardia 9/17 will discontinue Geodon due to  variable cardiac effects 9/24 bradycardia continues to be improved after Geodon discontinued 9/30: recurrent fall; no current dvt ppx Heart rate currently well-controlled, hypertension as above     16: HFrEF: Daily weights. Monitor for signs of overload  - 9/7 continue daily weights, fluid level management per hemodialysis team Weight reviewed and stable  9/21- 94.3- down 3 kg-   9/22- no weight for today- too agitated per nursing 9/30: weights downtrending; good POs; monitor 10-2: Weight stable Filed Weights   04/15/23 0500 04/17/23 1355 04/17/23 1755  Weight: 90.6 kg 90.6 kg 90.8 kg      17: Liver nodularity on CT scan: LFTs reported to be normal. Avoid hepatotoxic medications    -Abdominal ultrasound earlier this admission with minimal ascites; ammonia within normal limits  -8/31: Did discuss with nephrology whether p.o. lactulose would be beneficial despite normal ammonia, after reviewing records unsure that this would be beneficial    18: Epistaxis - resolved             -previously improved with flonase and saline spray   19: Morbid Obesity. Dietary counseling Body mass index is 39.01 kg/m.  20. Bradycardic: continue to monitor HR TID  -Discontinue Geodon  -9/19 heart rate in the 80s, stable  HR stable  21. HTN: resolved after HD, continue to monitor TID  9/19-20 intermittently elevated, continue to monitor.  Cautious with medication addition to avoid hypotension  See above   22. Hematuria - resolved  9/22- required in/out to check U/A- having new hematuria- nursing to keep an eye- since also bleeding from penis a little- monitor   9/23 no wBCs  on UA, follow  urine culture-Staphylococcus hemolyticus-discussed with pharmacy and ID  pharmacy.  Think this is more likely contaminant  9/25 discuss with nephrology about possible treatment or other evaluation options  Spoke with Dr. Ronalee Belts, most likely contaminant.  Recommended treating only if symptomatic.  If hematuria continues consider urology input  9/27 urine has improved to clear yellow -   23.  Vitamin C deficiency.  Aggressive repletion per psychiatry, IV over the next 7 days. -10-3: Does appear some cognitive improvement today; continue treatment  LOS: 38 days A FACE TO FACE EVALUATION WAS PERFORMED  Angelina Sheriff 04/19/2023, 9:57 AM

## 2023-04-19 NOTE — Progress Notes (Signed)
Physical Therapy Session Note  Patient Details  Name: OWAIN ECKERMAN MRN: [id number] Date of Birth: 1951-02-28  Today's Date: 04/19/2023 PT Individual Time: 0803-0832 PT Individual Time Calculation (min): 29 min   Short Term Goals: Week 5:  PT Short Term Goal 1 (Week 5): To continue to consistently meet LTG  Skilled Therapeutic Interventions/Progress Updates:    Pt presents in room in bed, awake, reports feeling bad. Upon further questioning with max cues for attention to question pt reports feeling tightness in chest. Vitals assessed, SpO2 98%, HR 80bpm, BP 155/100. Pt perseverative on feeling stabbing pain in bottom of foot requesting to have therapist look at it. Pt plantar surface of BLEs look to be WNL, no cuts or redness noted. Pt then requesting to get out of bed, pt completes sidelying to sitting on EOB with max assist for trunk to upright and for positioning BLEs on floor due to rigidity and poor following of commands. Pt requests assist to stand up, therapist provides max assist for sit to stand with pt then reporting pain in L shoulder. Therapist provides pt with max verbal cues and max assist to return to supine, therapist communicates with RN about vitals and symptoms. Pt remains supine with bed alarm on moderate sensitivity, telesitter in place at end of session.   Therapy Documentation Precautions:  Precautions Precautions: Fall Restrictions Weight Bearing Restrictions: No   Therapy/Group: Individual Therapy  Edwin Cap PT, DPT 04/19/2023, 10:35 AM

## 2023-04-19 NOTE — Consult Note (Addendum)
[name] [name] Psychiatry Consult Evaluation  Service Date: April 19, 2023 LOS:  LOS: 38 days    Primary Psychiatric Diagnoses  Delirium/Dementia 2.   Acute metabolic encephalopathy 3.   Vit C Deficiency    Assessment  [name] [name] is a 72 y.o. male admitted medically to the hospitalists service on 02/15/23 for confusion and SOB in the context of a recent COVID infection and admitted to acute care on 03/12/2023  4:25 PM for acute metabolic encephalopathy assumed due to recent COVID infection.  He has no past psychiatric diagnosis and has a past medical history of COPD, IDT2DM, GERD, and ESRD. Psychiatry was consulted for medication adjustment - lethargy, agitation, delirium Dr. Gasper Sells aware  by Dr. Shearon Stalls.  The patient's presentation remains most consistent with hypoactive delirium, given relatively acute onset, visual hallucinations, and agitation worse at night. Notably at this point (chronicity), incident dementia also high on differential - both periods of waxing and waning are significantly below premorbid baseline. Most likely due to multiple etiologies including but not limited to infection (recent COVID-19 infection), medications, pain, altered sleep/wake cycle, and limited mobility. Appreciate PT/OT/ST efforts in increasing pt activity during the day.   Patient has had no reported adverse affects to Abilify, which was started the last time psychiatry was involved. This time in discussion with Dr. Shearon Stalls and review of records, he is on the precipice of hospice if he is not able to remain calm during dialysis.   On evaluation today, patient is sitting upright in chair.  He is awake and alert.  He is oriented to person and place with visual prompts in his room.  He is unable to state the date mom to being available for him with encouragement to use.  Patient has some recall of recent meal, but also confabulates things that were eaten (wife at bedside confirming fact versus confabulation).   Patient had received hydroxyzine 10 mg in anticipation for dialysis today which was to be starting within 15-20 minutes of my assessment.  Patient is agreeable to taking dialysis, however does note that his dialysis on Tuesday was "not effective".  Patient states that he has pain at his fistula site, and feels pain in his chest when he receives dialysis which causes them to ended early.  Patient specifically denies any SI, HI, AVH.  He states "I want to get dialysis and live, I just wish it did not hurt".  In reviewing medication administration record, it does document patient received a dose of hydroxyzine last night followed within 40 minutes of a dose of Ativan for symptoms not controlled by hydroxyzine.  Given this information, and patient's anxiety related dialysis hurting, I have elected for patient to receive the 0.5 mg Ativan dose prior to his dialysis treatment.  Psychiatry will continue to follow to evaluate effectiveness of medication and patient being able to complete dialysis.  Wife is in patient room, and plans to sit with patient during dialysis today.  She is hopeful that he can discharge with outpatient dialysis through [facility], where she would be able to accompany patient and stay during dialysis treatments.   Minimization of deliriogenic insults will continue to be of utmost importance. This includes promoting the normal circadian cycle, minimizing lines/tubes, avoiding deliriogenic medications such as benzodiazepines and anticholinergic medications, and frequently reorienting the patient. Symptomatic treatment for agitation can be provided by antipsychotic medications, though it is important to remember that these do not treat the underlying etiology of delirium. Notably, there can be  a time lag effect between treatment of a medical problem and resolution of delirium. This time lag effect may be of longer duration in the elderly, and those with underlying cognitive impairment or brain  injury.   Diagnoses:  Active Hospital problems: Principal Problem:   Acute metabolic encephalopathy Active Problems:   Type 2 diabetes mellitus with chronic kidney disease on chronic dialysis, with long-term current use of insulin (HCC)   HFrEF (heart failure with reduced ejection fraction) (HCC)   Morbid obesity (HCC)   Encephalopathy   Epistaxis   PAF (paroxysmal atrial fibrillation) (HCC)   Long term (current) use of insulin (HCC)   ESRD on dialysis (HCC)   Debility   Agitation   Hematuria     Plan   ## Psychiatric Medication Recommendations:  -- Continue Abilify 7.5 mg, PO, daily at bedtime(agitation) -- Continue hydroxyzine once before dialysis (thought he was getting it today) and PRN before dialysis (itching) --Continue as needed lorazepam 0.5 mg if hydroxyzine is ineffective.  May need to consider making as a scheduled medication if patient is able to tolerate dialysis treatment. --Continue Depakote 125 mg every 8 hours which can be effective for mood stabilization. -- Consider memantine if symptoms of delirium do not improve -- Continue Vit C; prefer IV d/t imminence of tx to hospice if no cognitive improvement   ## Medical Decision Making Capacity:  -- Clearly lacks for most decisions; wife is listed as HCPOA  ## Further Work-up:  -- Most recent EKG on 03/28/23 had QtC of 424 -- Pertinent labwork reviewed earlier this admission includes: v low vit C    reasonably extensive work-up largely wnl with the exception of labs associated with ESRD including low eGFR, high BUN, and high albumin, all of which are associated with an increased risk of dementia, delirium, and acute metabolic encephalopathy.  ## Disposition:  -- Patient's wife prefers that patient be discharged to a rehab facility; if not possible she prefers he be discharged home and to transport patient to a dialysis center as needed. No psychiatric contraindications to this plan - defer to PT, OT, and medical  assessment. Patient's wife states that a family member can accompany him to dialysis center to help reduce the risk and complications of agitation. Patient has responded well to Abilify and has had no reported significant agitation over the past 48 hours. -- Recommend a TOC consult  ## Behavioral / Environmental:  --  Delirium Precautions: Delirium Interventions for Nursing and Staff: - RN to open blinds every AM. - To Bedside: Glasses and pt's own shoes. Make available to patient when possible and encourage use. - Encourage po fluids when appropriate, keep fluids within reach. - OOB to chair with meals. - Passive ROM exercises to all extremities with AM & PM care. - RN to assess orientation to person, time and place QAM and PRN. - Recommend extended visitation hours with familiar family/friends as feasible. - Staff to minimize disturbances at night. Turn off television when pt asleep or when not in use.  - When patient is awake and watching TV, his wife reports that he likes watching western movies, racing, boxing, or [place]  ## Safety and Observation Level:  - Based on my clinical evaluation, I estimate the patient to be at low risk of self harm in the current setting - At this time, we recommend a routine level of observation. This decision is based on my review of the chart including patient's history and current presentation, interview of  the patient, mental status examination, and consideration of suicide risk including evaluating suicidal ideation, plan, intent, suicidal or self-harm behaviors, risk factors, and protective factors. This judgment is based on our ability to directly address suicide risk, implement suicide prevention strategies and develop a safety plan while the patient is in the clinical setting. Please contact our team if there is a concern that risk level has changed.  Suicide risk assessment: Patient has following modifiable risk factors for suicide: access to guns,  which we are addressing by the patient's wife planning to lock up the guns prior to the patient returning home.   Patient has following non-modifiable or demographic risk factors for suicide: male gender and psychiatric hospitalization  Patient has the following protective factors against suicide: Supportive family, Supportive friends, and no history of suicide attempts  Thank you for this consult request. Recommendations have been communicated to the primary team.  We continue to follow at this time.   Mariel Craft, MD  Psychiatric and Social History   Relevant Aspects of Hospital Course:  Admitted on 02/15/23 for confusion and SOB in the context of a recent COVID infection. He was found to have sepsis secondary to peritoneal dialysis associated catheter peritonitis and polymicrobial bacteremia/fungemia. Broad spectrum antibiotics started. Hospital course complicated by worsening encephalopathy with hypoxemia requiring transfer to the ICU.   Patient Report:  Patient was awake, alert, oriented to person, and oriented with prompts to location.  Patient is expecting to do dialysis today, and is agreeable to continuing dialysis treatment.  He does appear to have anticipatory anxiety related to at his fistula site and chest pain that occurs during dialysis.  He is agreeable to hydroxyzine and lorazepam manage itching and anxiety. Wife is at bedside and agreeable to plan.   Psych ROS:  Today denied SI, HI, AH/VH both historic and current. Not able to do detailed sx inventory.   Depression: In hospital, patient displays signs of sleep disturbance, fatigue, diminished concentration and cognition, and psychomotor changes. 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  Anxiety: Patient did not express or display concerns for anxiety, but was somnolent  throughout interview Mania (lifetime and current): No reported symptoms of mania  Psychosis: (lifetime and current): Patient's wife reports that over the course of his hospital stay, at times the patient had hallucinations about deceased or living family members who are not currently in the room with him. None reported in the past 48 hours.  Collateral information:  10/2 wife consented to PRN hydroxyzine and vit C supplementation.   Contacted patient's wife, Payten Beaumier, at 3:35 pm on 04/04/23. She states that she first noticed changes in the patient's mental status shortly after patient had COVID. She states that he tested positive for COVID at an urgent care in [place] on 01/28/23 and that around that time he began becoming more forgetful. On 02/14/23 the patient's wife observed that he thought he was in the bathroom when he was in the bedroom, and that he had recently been running a fever of 101.3 before she gave him a bath to cool him down, so she called an ambulance. He was transported to [facility] and subsequently transferred to [facility]. She reports that over the course of his 7-week hospital stay, he has become progressively worse. She reports that in the first few days of admission  he was able to request specific television shows to watch, but that he has become progressively somnolent and has had periods with hallucinations. She reports that he has at times called out to deceased or living relatives who were not in the room.   Psychiatric History:  Information collected from patient's wife and EMR  Prev Psych Dx/Sx: None Current Psych Provider: None Home Meds (current): None Previous Psych Med Trials: No past history Therapy: No past history  Prior ECT: Wife denies Prior Psych Hospitalization: Wife denies Prior Self Harm: Wife denies Prior Violence: Wife denies  Family Psych History: Wife states that there is no known psych history in the patient's family Family Hx suicide: Wife  denies  Social History:  Developmental Hx: None on record Educational Hx: Deferred Occupational Hx: Patient worked as a Naval architect for 32 years Legal Hx: Wife states patient currently has no legal issues Living Situation: Lives with wife at home Spiritual Hx: Patient normally attends church every Sunday and participates in Sunday school Access to weapons: Wife states that there are currently guns in the home, but she plans to have them locked up prior to the patient returning home  Substance History Tobacco use: Wife states patient does not use tobacco Alcohol use: Wife states patient does not use alcohol Drug use: Wife states patient does not use drugs   Exam Findings   Psychiatric Specialty Exam: Patient is oriented to self and place. Attention and concentration are good. He has anxious affect. He denies SI, HI, or AVH. He is cooperative throughout the interview and displays no signs of agitation.  Physical Exam: Vital signs:  Temp:  [97.7 F (36.5 C)-97.8 F (36.6 C)] 97.8 F (36.6 C) (10/03 1345) Pulse Rate:  [62-84] 84 (10/03 1430) Resp:  [16-18] 18 (10/03 1430) BP: (116-138)/(61-82) 138/78 (10/03 1430) SpO2:  [94 %-97 %] 97 % (10/03 1430)  Physical Exam Constitutional:      Appearance: Normal appearance. He is obese.  HENT:     Head: Normocephalic and atraumatic.  Eyes:     Conjunctiva/sclera: Conjunctivae normal.  Cardiovascular:     Rate and Rhythm: Normal rate.  Pulmonary:     Effort: Pulmonary effort is normal. No respiratory distress.  Neurological:     General: No focal deficit present.    Blood pressure 138/78, pulse 84, temperature 97.8 F (36.6 C), resp. rate 18, height 5\' 4"  (1.626 m), weight 90.8 kg, SpO2 97%. Body mass index is 34.36 kg/m.   Other History   These have been pulled in through the EMR, reviewed, and updated if appropriate.   Family History:  The patient's family history includes Arthritis in an other family member; Asthma in  an other family member; Cancer in his mother; Diabetes in his father, mother, and sister; Hypertension in his father, mother, and sister; Lung disease in an other family member.  Medical History: Past Medical History:  Diagnosis Date   Anemia    Arthritis    Asthma    Bell palsy    CAD (coronary artery disease)    a. 2014 MV: abnl w/ infap ischemia; b. 03/2013 Cath: aneurysmal bleb in the LAD w/ otw nonobs dzs-->Med Rx.   Chronic back pain    Chronic knee pain    a. 09/2015 s/p R TKA.   Chronic pain    Chronic shoulder pain    Chronic sinusitis    COPD (chronic obstructive pulmonary disease) (HCC)    Diabetes mellitus without complication (HCC)  type II    ESRD on peritoneal dialysis (HCC)    on peritoneal dialysis, [facility]   Essential hypertension    GERD (gastroesophageal reflux disease)    Gout    Gout    Hepatomegaly    noted on noncontrast CT 2015   History of hiatal hernia    Hyperlipidemia    Lateral meniscus tear    Obesity    Truncal   Obstructive sleep apnea    does not use cpap    On home oxygen therapy    uses 2l when is going somewhere per patient    PUD (peptic ulcer disease)    remote, reports f/u EGD about 8 years ago unremarkable    Reactive airway disease    related to exposure to chemical during 9/11   Sinusitis    Vitamin D deficiency     Surgical History: Past Surgical History:  Procedure Laterality Date   A/V FISTULAGRAM N/A 02/28/2023   Procedure: A/V Fistulagram;  Surgeon: Victorino Sparrow, MD;  Location: [facility] INVASIVE CV LAB;  Service: Cardiovascular;  Laterality: N/A;   ASAD LT SHOULDER  12/15/2008   left shoulder   AV FISTULA PLACEMENT Left 08/09/2016   Procedure: BRACHIOCEPHALIC ARTERIOVENOUS (AV) FISTULA CREATION LEFT ARM;  Surgeon: Sherren Kerns, MD;  Location: [facility] OR;  Service: Vascular;  Laterality: Left;   CAPD INSERTION N/A 10/07/2018   Procedure: LAPAROSCOPIC PERITONEAL CATHETER PLACEMENT;  Surgeon: Rodman Pickle, MD;  Location: [facility] ORS;  Service: General;  Laterality: N/A;   CATARACT EXTRACTION W/PHACO Left 03/28/2016   Procedure: CATARACT EXTRACTION PHACO AND INTRAOCULAR LENS PLACEMENT LEFT EYE;  Surgeon: Jethro Bolus, MD;  Location: [facility] ORS;  Service: Ophthalmology;  Laterality: Left;  CDE: 4.77   CATARACT EXTRACTION W/PHACO Right 04/11/2016   Procedure: CATARACT EXTRACTION PHACO AND INTRAOCULAR LENS PLACEMENT RIGHT EYE; CDE:  4.74;  Surgeon: Jethro Bolus, MD;  Location: [facility] ORS;  Service: Ophthalmology;  Laterality: Right;   COLONOSCOPY  10/15/2008   Fields: Rectal polyp obliterated, not retrieved, hemorrhoids, single ascending colon diverticulum near the CV. Next colonoscopy April 2020   COLONOSCOPY N/A 12/25/2014   SLF: 1. Colorectal polyps (2) removed 2. Small internal hemorrhoids 3. the left colon is severely redundant. hyperplastic polyps   CORONARY ARTERY BYPASS GRAFT N/A 06/05/2022   Procedure: OFF PUMP CORONARY ARTERY BYPASS GRAFTING (CABG) X 2 BYPASSES USING LEFT INTERNAL MAMMARY ARTERY AND RIGHT LEG GREATER SAPHENOUS VEIN HARVESTED ENDOSCOPICALLY;  Surgeon: Corliss Skains, MD;  Location: [facility] OR;  Service: Open Heart Surgery;  Laterality: N/A;   CORONARY STENT INTERVENTION N/A 07/25/2021   Procedure: CORONARY STENT INTERVENTION;  Surgeon: Tonny Bollman, MD;  Location: [facility] INVASIVE CV LAB;  Service: Cardiovascular;  Laterality: N/A;   CORONARY STENT INTERVENTION N/A 12/26/2021   Procedure: CORONARY STENT INTERVENTION;  Surgeon: Yvonne Kendall, MD;  Location: [facility] INVASIVE CV LAB;  Service: Cardiovascular;  Laterality: N/A;   CORONARY STENT INTERVENTION N/A 01/20/2022   Procedure: CORONARY STENT INTERVENTION;  Surgeon: Tonny Bollman, MD;  Location: [facility] INVASIVE CV LAB;  Service: Cardiovascular;  Laterality: N/A;   DOPPLER ECHOCARDIOGRAPHY     ESOPHAGOGASTRODUODENOSCOPY N/A 12/25/2014   SLF: 1. Anemia most likely due to CRI, gastritis, gastric polyps 2. Moderate non-erosive gastriits and  mild duodenitis.  3.TWo large gstric polyps removed.    EYE SURGERY  12/22/2010   tear duct probing-   FOREIGN BODY REMOVAL  03/29/2011   Procedure: REMOVAL FOREIGN BODY EXTREMITY;  Surgeon: Fuller Canada, MD;  Location: [facility] ORS;  Service: Orthopedics;  Laterality: Right;  Removal Foreign Body Right Thumb   IR FLUORO GUIDE CV LINE RIGHT  08/06/2018   IR FLUORO GUIDE CV LINE RIGHT  02/17/2023   IR US GUIDE VASC ACCESS RIGHT  08/06/2018   IR US GUIDE VASC ACCESS RIGHT  02/17/2023   KNEE ARTHROSCOPY  10/16/2007   left   KNEE ARTHROSCOPY WITH LATERAL MENISECTOMY Right 10/14/2015   Procedure: LEFT KNEE ARTHROSCOPY WITH PARTIAL LATERAL MENISECTOMY;  Surgeon: Vickki Hearing, MD;  Location: [facility] ORS;  Service: Orthopedics;  Laterality: Right;   LEFT HEART CATH AND CORONARY ANGIOGRAPHY N/A 07/25/2021   Procedure: LEFT HEART CATH AND CORONARY ANGIOGRAPHY;  Surgeon: Tonny Bollman, MD;  Location: [facility] INVASIVE CV LAB;  Service: Cardiovascular;  Laterality: N/A;   LEFT HEART CATH AND CORONARY ANGIOGRAPHY N/A 12/26/2021   Procedure: LEFT HEART CATH AND CORONARY ANGIOGRAPHY;  Surgeon: Yvonne Kendall, MD;  Location: [facility] INVASIVE CV LAB;  Service: Cardiovascular;  Laterality: N/A;   LEFT HEART CATH AND CORONARY ANGIOGRAPHY N/A 01/20/2022   Procedure: LEFT HEART CATH AND CORONARY ANGIOGRAPHY;  Surgeon: Tonny Bollman, MD;  Location: [facility] INVASIVE CV LAB;  Service: Cardiovascular;  Laterality: N/A;   LEFT HEART CATHETERIZATION WITH CORONARY ANGIOGRAM N/A 03/28/2013   Procedure: LEFT HEART CATHETERIZATION WITH CORONARY ANGIOGRAM;  Surgeon: Marykay Lex, MD;  Location: [facility] CATH LAB;  Service: Cardiovascular;  Laterality: N/A;   NM MYOVIEW LTD     PENILE PROSTHESIS IMPLANT N/A 08/16/2015   Procedure: PENILE PROTHESIS INFLATABLE, three piece, Excisional biopsy of Penile ulcer, Penile molding;  Surgeon: Jethro Bolus, MD;  Location: [facility] ORS;  Service: Urology;  Laterality: N/A;   PENILE PROSTHESIS IMPLANT  N/A 12/24/2017   Procedure: REMOVAL AND  REPLACEMENT  COLOPLAST PENILE PROSTHESIS;  Surgeon: Crista Elliot, MD;  Location: [facility] ORS;  Service: Urology;  Laterality: N/A;   PERIPHERAL VASCULAR BALLOON ANGIOPLASTY  02/28/2023   Procedure: PERIPHERAL VASCULAR BALLOON ANGIOPLASTY;  Surgeon: Victorino Sparrow, MD;  Location: [facility] INVASIVE CV LAB;  Service: Cardiovascular;;   PORT-A-CATH REMOVAL N/A 02/19/2023   Procedure: REMOVAL OF PERITONEAL DIALYSIS CATHETER;  Surgeon: Victorino Sparrow, MD;  Location: [facility] OR;  Service: Vascular;  Laterality: N/A;   QUADRICEPS TENDON REPAIR  07/21/2011   Procedure: REPAIR QUADRICEP TENDON;  Surgeon: Fuller Canada, MD;  Location: [facility] ORS;  Service: Orthopedics;  Laterality: Right;   RIGHT/LEFT HEART CATH AND CORONARY ANGIOGRAPHY N/A 05/30/2022   Procedure: RIGHT/LEFT HEART CATH AND CORONARY ANGIOGRAPHY;  Surgeon: Corky Crafts, MD;  Location: [facility] INVASIVE CV LAB;  Service: Cardiovascular;  Laterality: N/A;   TEE WITHOUT CARDIOVERSION N/A 06/05/2022   Procedure: TRANSESOPHAGEAL ECHOCARDIOGRAM (TEE);  Surgeon: Corliss Skains, MD;  Location: [facility] OR;  Service: Open Heart Surgery;  Laterality: N/A;   TOENAIL EXCISION     removed x2-bilateral   UMBILICAL HERNIA REPAIR  07/17/2005   [place]    Medications:   Current Facility-Administered Medications:    (feeding supplement) PROSource Plus liquid 30 mL, 30 mL, Oral, BID BM, Stovall, Kathryn R, PA-C, 30 mL at 04/19/23 1053   0.9 %  sodium chloride infusion, , Intravenous, PRN, Angelina Sheriff, DO, Last Rate: 10 mL/hr at 04/18/23 1515, New Bag at 04/18/23 1515   acetaminophen (TYLENOL) tablet 325-650 mg, 325-650 mg, Oral, Q4H PRN, Milinda Antis, PA-C, 650 mg at 04/12/23 2036   ARIPiprazole (ABILIFY) tablet 7.5 mg, 7.5 mg, Oral, QHS, Fanny Dance, MD, 7.5 mg at 04/18/23 2103   [  START ON 05/02/2023] ascorbic acid (VITAMIN C) tablet 500 mg, 500 mg, Oral, Daily, Angelina Sheriff, DO   aspirin EC tablet 81  mg, 81 mg, Oral, Daily, Milinda Antis, PA-C, 81 mg at 04/19/23 0844   atorvastatin (LIPITOR) tablet 80 mg, 80 mg, Oral, Daily, Milinda Antis, PA-C, 80 mg at 04/19/23 0844   bisacodyl (DULCOLAX) EC tablet 5 mg, 5 mg, Oral, Daily PRN, Valetta Fuller, Lynnell Jude, PA-C   Chlorhexidine Gluconate Cloth 2 % PADS 6 each, 6 each, Topical, Q0600, Ejigiri, Ogechi Grace, PA-C   Chlorhexidine Gluconate Cloth 2 % PADS 6 each, 6 each, Topical, Q0600, Penninger, Lillia Abed, PA   Darbepoetin Alfa (ARANESP) injection 150 mcg, 150 mcg, Subcutaneous, Q Sun-1800, Paytes, Austin A, RPH, 150 mcg at 04/15/23 1959   dextrose 5 % 100 mL with ascorbic acid (VITAMIN C) 200 mg infusion, , Intravenous, Q T,Th,Sat-1800, Engler, Morgan C, DO   divalproex (DEPAKOTE SPRINKLE) capsule 125 mg, 125 mg, Oral, Q8H, Shtridelman, Yuri, MD, 125 mg at 04/18/23 2103   feeding supplement (ENSURE ENLIVE / ENSURE PLUS) liquid 237 mL, 237 mL, Oral, BID WC, Setzer, Sandra J, PA-C, 237 mL at 04/18/23 1024   Gerhardt's butt cream 1 Application, 1 Application, Topical, QID, Setzer, Lynnell Jude, PA-C, 1 Application at 04/18/23 2105   guaiFENesin-dextromethorphan (ROBITUSSIN DM) 100-10 MG/5ML syrup 10 mL, 10 mL, Oral, Q6H PRN, Milinda Antis, PA-C, 10 mL at 04/03/23 1633   hydrOXYzine (ATARAX) tablet 10 mg, 10 mg, Oral, TID PRN, Elijah Birk C, DO, 10 mg at 04/19/23 1212   lidocaine (LIDODERM) 5 % 1 patch, 1 patch, Transdermal, Q2200, Setzer, Lynnell Jude, PA-C, 1 patch at 04/12/23 2035   lidocaine (LIDODERM) 5 % 1 patch, 1 patch, Transdermal, Q24H, Skip Mayer A, MD, 1 patch at 04/15/23 2000   LORazepam (ATIVAN) tablet 0.5 mg, 0.5 mg, Oral, Daily PRN, Elijah Birk C, DO, 0.5 mg at 04/18/23 2140   mometasone-formoterol (DULERA) 200-5 MCG/ACT inhaler 2 puff, 2 puff, Inhalation, BID, Setzer, Lynnell Jude, PA-C, 2 puff at 04/19/23 0841   Muscle Rub CREA, , Topical, PRN, Fanny Dance, MD   ondansetron [facility]) tablet 4 mg, 4 mg, Oral, Q6H PRN, 4 mg at  03/16/23 2154 **OR** ondansetron (ZOFRAN) injection 4 mg, 4 mg, Intravenous, Q6H PRN, Setzer, Lynnell Jude, PA-C, 4 mg at 04/03/23 1603   oxyCODONE (Oxy IR/ROXICODONE) immediate release tablet 2.5 mg, 2.5 mg, Oral, TID PRN, Elijah Birk C, DO, 2.5 mg at 04/15/23 1122   pantoprazole (PROTONIX) EC tablet 40 mg, 40 mg, Oral, BID, Setzer, Lynnell Jude, PA-C, 40 mg at 04/19/23 0843   traZODone (DESYREL) tablet 50 mg, 50 mg, Oral, QHS PRN, Ranelle Oyster, MD, 50 mg at 04/18/23 2221   traZODone (DESYREL) tablet 50 mg, 50 mg, Oral, QHS, Ranelle Oyster, MD, 50 mg at 04/18/23 2103  Allergies: Allergies  Allergen Reactions   Opana [Oxymorphone Hcl] Itching   Tramadol Itching

## 2023-04-19 NOTE — Progress Notes (Signed)
At the start of the evening in room siting in the chair with IV fluids running. Calm and cooperative at this time. Writer able to interact with him and patient having conversation with Clinical research associate. Able to make his needs and concerns known. Did endorse feeling tired. Voice is hoarse. Expressed not needing anything at that time and not needing ADLS attended to.  Around 2030 writer interacting with him again. Is requesting an apple saying it was on the sink but no apple on the sink. Explained would see if able to find him an apple. Brought in apple sauce, grapes, and cookies for patient which he ate.  Around 2100 began to demonstrate impulsive behavior and attempting to exit the wheelchair. Not wanting to go to bed at this time.  Refusing medication around 2200. Attempted several times to encourage patient to take the medication and expressed the importance of the medications. Continued to refuse. Becoming irritable and verbally challenging to responses. Patient also responding to visual hallucinations and talking to people who are not in the room with him at this time. Also, continues to endorse not feeling good but unable to elaborate any further. Due to tremors spilling apple sauce on himself. Assisted cleaning patient. Third attempt with medication removed shirt and glasses on the ground in front of him. Refusing to go back to bed.  Returning back to round on patient with Nurse Tech encouraging patient again to lay down in bed. Was agreeable. For fourth time asking patient if wanting to take night time scheduled medication. Was agreeable at this time. After was assisted to his bed. While stand pivoting from wheelchair to the bed spacticity with legs and back. Difficulty repositioning himself to lie down in bed. Staff assisting him into bed.  One light left on in the room. Music turned on for patient. Bed alarm and fall safety pad on the floor near patient's bed.

## 2023-04-19 NOTE — Progress Notes (Signed)
Speech Language Pathology Daily Session Note  Patient Details  Name: WHITTAKER LENIS MRN: [id number] Date of Birth: 1950/07/29  Today's Date: 04/19/2023 SLP Individual Time: 1130-1200 SLP Individual Time Calculation (min): 30 min  Short Term Goals: Week 6: SLP Short Term Goal 1 (Week 6): Patient will utilize external aids to orient to time and situation given modA SLP Short Term Goal 2 (Week 6): Pt will complete functional problem solving with 60% acc with max A. SLP Short Term Goal 3 (Week 6): Pt will attend to functional task for 5-7 minutes given mod multimodal cues. SLP Short Term Goal 4 (Week 6): Pt will answer biographical questions via multimodal communication accurately with 75% acc with mod A  Skilled Therapeutic Interventions: Skilled treatment session focused on cognitive goals. Upon arrival, patient was awake in the wheelchair with his wife present while eating eggs from an outside restaurant.  While self-feeding, patient participated in a functional conversation regarding importance of completing dialysis with his wife providing encouragement. Patient verbalized understanding but also reported that he has difficulty completing dialysis due to pain. SLP provided suggestions like listening to music, etc to help assist with pain management, patient will need reinforcement. SLP provided patient with a calendar for this month. Patient able to mark out the days that had already happened with Max verbal cues needed for attention due to fatigue and Mod verbal cues for problem solving. Patient left upright in wheelchair with alarm on and all needs within reach. Continue with current plan of care.      Pain No/Denies Pain   Therapy/Group: Individual Therapy  Antwine Agosto 04/19/2023, 3:30 PM

## 2023-04-19 NOTE — Progress Notes (Signed)
   04/19/23 1815  Vitals  Temp (!) 97.3 F (36.3 C)  Pulse Rate 89  Resp 18  BP (!) 145/91  SpO2 99 %  O2 Device Room Air  Weight  (unable to obtain)  Oxygen Therapy  Patient Activity (if Appropriate) In bed  Post Treatment  Dialyzer Clearance Lightly streaked  Hemodialysis Intake (mL) 0 mL  Liters Processed 78.3  Fluid Removed (mL) 2000 mL  Tolerated HD Treatment Yes  AVG/AVF Arterial Site Held (minutes) 10 minutes  AVG/AVF Venous Site Held (minutes) 10 minutes   Received patient in bed, HD at [facility] due to behavioral issues with wife at bedside. PT remained confused and agitated throughout tx attempted to put at needles multiple times and trying to get out of bed. Pt requires maximum care HD tx. PA notified of behavior wife remains at bedside.  Alert and disoriented Informed consent signed and in chart.   TX duration:3.5hrs  Patient tolerated well.  Transported back to the room  Alert, without acute distress.  Hand-off given to patient's nurse.   Access used: LUA AVF Access issues: none  Total UF removed: 2L Medication(s) given: none     [name]

## 2023-04-19 NOTE — Progress Notes (Signed)
[facility] Progress Note   Subjective:   Patient seen and examined at nurses station.  More alert today. No specific complaints.  Discussed his wife will be sitting with him during dialysis in the room today.  Seemed pleased to hear that.  Denies CP, SOB, abdominal pain and n/v/d.   Objective Vitals:   04/18/23 1307 04/19/23 0605 04/19/23 0841 04/19/23 1254  BP: (!) 148/79 118/70  132/82  Pulse:  78  73  Resp: 18 18  18   Temp:  97.7 F (36.5 C)    TempSrc:      SpO2: 95% 94% 94% 95%  Weight:      Height:       Physical Exam General:Chronically ill appearing male, sitting in wheelchair in NAD Heart:RRR, no mrg Lungs:+scattered rhonchi, nml WOB on RA Abdomen:soft, NTND Extremities:trace LE edema Dialysis Access: LU AVF +b/t   Filed Weights   04/15/23 0500 04/17/23 1355 04/17/23 1755  Weight: 90.6 kg 90.6 kg 90.8 kg    Intake/Output Summary (Last 24 hours) at 04/19/2023 1405 Last data filed at 04/19/2023 1301 Gross per 24 hour  Intake 356 ml  Output --  Net 356 ml    Additional Objective Labs: Basic Metabolic Panel: Recent Labs  Lab 04/14/23 1152 04/18/23 1001  NA 133* 134*  K 4.7 3.9  CL 94* 94*  CO2 23 25  GLUCOSE 154* 118*  BUN 50* 36*  CREATININE 12.62* 9.50*  CALCIUM 9.3 9.2  PHOS 4.6  --    Liver Function Tests: Recent Labs  Lab 04/14/23 1152 04/18/23 1001  AST  --  30  ALT  --  21  ALKPHOS  --  78  BILITOT  --  0.8  PROT  --  6.9  ALBUMIN 2.6* 2.5*   CBC: Recent Labs  Lab 04/14/23 1152 04/18/23 1001  WBC 4.3 4.3  NEUTROABS  --  3.0  HGB 10.3* 10.1*  HCT 35.1* 33.6*  MCV 90.9 89.6  PLT 242 220   Blood Culture    Component Value Date/Time   SDES URINE, CATHETERIZED 04/08/2023 0915   SPECREQUEST  04/08/2023 0915    NONE Performed at [facility], [address], [place], [place] [address]    CULT >=100,000 COLONIES/mL STAPHYLOCOCCUS HAEMOLYTICUS (A) 04/08/2023 0915   REPTSTATUS 04/10/2023 FINAL 04/08/2023  0915    Medications:  sodium chloride 10 mL/hr at 04/18/23 1515   dextrose 5 % 100 mL with ascorbic acid (VITAMIN C) 200 mg infusion      (feeding supplement) PROSource Plus  30 mL Oral BID BM   ARIPiprazole  7.5 mg Oral QHS   [START ON 05/02/2023] ascorbic acid  500 mg Oral Daily   aspirin EC  81 mg Oral Daily   atorvastatin  80 mg Oral Daily   Chlorhexidine Gluconate Cloth  6 each Topical Q0600   Chlorhexidine Gluconate Cloth  6 each Topical Q0600   darbepoetin (ARANESP) injection - DIALYSIS  150 mcg Subcutaneous Q Sun-1800   divalproex  125 mg Oral Q8H   feeding supplement  237 mL Oral BID WC   Gerhardt's butt cream  1 Application Topical QID   lidocaine  1 patch Transdermal Q2200   lidocaine  1 patch Transdermal Q24H   mometasone-formoterol  2 puff Inhalation BID   pantoprazole  40 mg Oral BID   traZODone  50 mg Oral QHS    Dialysis Orders: Previously on [facility] thru [facility] - changed to HD - will need outpatient  HD unit established prior to d/c. 4hr, 3K, EDW ~96, L AVF    Assessment/Plan: 1. Debility - in CIR 2. Encephalopathy/agitation: Waxing/waning agitation, dementia, sundowning. No acute findings on imaging. He has been disruptive during HD.  Likely needs family as sitter for safety, but sitters are not typically allowed in the hospital HD unit. Plan for bedside HD with wife as sitter today to see if patient behavior improved with her to help redirect him.  If not will need to strongly consider transition to hospice as he will not be a candidate for outpatient dialysis d/t concern for patient/staff safety.  Previously discussed w/HD staff - last treatment with loud, aggressive behavior. Have been using IV ativan to assist w/ getting him through dialysis. Use of IV Ativan can not be done in OP HD units. IR will not place catheter, agitation is not an indication to place cathter.  Rehab team adjusting meds.  3. ESRD: Transitioned from PD to HD recently on TTS schedule.  Using old AVF which is still functional. Off schedule last week for HD this week d/t behavior.  Family would prefer TTS HD. Coordinate with rehab team so they know to premedicate before he comes to [facility]. HD today with wife to sit with him. 4.Hypertension/volume: BP in goal. Midodrine 5mg  stopped. UF as tolerated.  5. Anemia: Hgb 10.1-on Aranesp weekly. Last Tsat 38% with ferritin 2175.  No iron.  6. Metabolic bone disease: CorrCa 09.8 , Phos acceptable. No VDRA. 7.  Nutrition:  Alb 2.5, continue supps.  Renal diet w/fluid restrictions.  8.  Hx NSVT: On Coreg 10.  Recent culture negative PD cath-related peritonitis and associated fungemia + enterococcus bacteremia - underwent PD cath removal, TDC placement and removal, temp HD cath placement  and removal, and line holiday. Completed IV unasyn, IV micafungin and cipro on 8/15. TDC not replaced cause he had functional AVF.   11. Hematuria - dirty UA. Coag neg staph on Ucx - defer to primary team  12. GOC - Seen by palliative care - Now DNR/DNI wants to continue dialysis. See above. 13. Dispo: For SNF placement. Regarding outpatient dialysis placement- per renal navigator he will need new referral since he has been gone from his prior clinic so long. Family members all willing to sit with him during treatments.  At this time he is not appropriate for outpatient dialysis d/t safety concerns with ongoing agitation and requiring restraints and multiple staff to reorient during treatments.  Virgina Norfolk, PA-C [facility] 04/19/2023,2:05 PM  LOS: 38 days

## 2023-04-19 NOTE — Progress Notes (Signed)
Complained of acute onset of chest and left shoulder pain at approximately 0845. Informed by attendant RN. 12 lead EKG ordered. Patient awake, alert in NAD. Mental status at baseline. Complaining of nausea and wants to sit up. He has not been OOB or had breakfast yet. He denies SOB. EKG = NSR, no ST elevation. RRR. Few left basilar crackles. Assisted NTs to get him OOB to WC. Continue to closely monitor.

## 2023-04-19 NOTE — Progress Notes (Signed)
Occupational Therapy Note  Patient Details  Name: [name] [name] MRN: [id number] Date of Birth: [birth year]  Today's Date: 04/19/2023 OT Missed Time: 30 Minutes Missed Time Reason: Other (comment) (chest  pain)  Pt with onset of chest pain and left shoulder pain during PT session. PA ordered EKG and pt asleep in wc at nurses station. OT will attempt to see pt once appropriate for therapy.    [name] [name] 04/19/2023, 9:50 AM

## 2023-04-20 ENCOUNTER — Encounter (HOSPITAL_COMMUNITY): Payer: Medicare Other

## 2023-04-20 DIAGNOSIS — E119 Type 2 diabetes mellitus without complications: Secondary | ICD-10-CM

## 2023-04-20 DIAGNOSIS — F419 Anxiety disorder, unspecified: Secondary | ICD-10-CM

## 2023-04-20 MED ORDER — LORAZEPAM 0.5 MG PO TABS
1.0000 mg | ORAL_TABLET | ORAL | Status: DC
  Administered 2023-04-21 – 2023-04-28 (×4): 1 mg via ORAL
  Filled 2023-04-20 (×4): qty 2

## 2023-04-20 MED ORDER — CHLORHEXIDINE GLUCONATE CLOTH 2 % EX PADS
6.0000 | MEDICATED_PAD | Freq: Every day | CUTANEOUS | Status: DC
  Administered 2023-04-20 – 2023-04-21 (×2): 6 via TOPICAL

## 2023-04-20 MED ORDER — DIVALPROEX SODIUM 125 MG PO CSDR: 250.0000 mg | DELAYED_RELEASE_CAPSULE | Freq: Two times a day (BID) | ORAL | Status: DC

## 2023-04-20 MED ORDER — DIVALPROEX SODIUM ER 250 MG PO TB24
250.0000 mg | ORAL_TABLET | Freq: Every day | ORAL | Status: DC
  Administered 2023-04-20 – 2023-04-23 (×3): 250 mg via ORAL
  Filled 2023-04-20 (×6): qty 1

## 2023-04-20 NOTE — Progress Notes (Signed)
Given PRN dose of Trazodone. Leg over the rail of the bed. Asking to use the urinal. Assisted to use the urinal but then refused to use the urinal. Veterinary surgeon attempting to have patient return back to bed and rest tonight. Sitting at the edge of the bed. Staff unable to leave room at this time. Staying with patient till able to be in bed in a safe position.

## 2023-04-20 NOTE — Consult Note (Signed)
[name] Consult Evaluation  Service Date: April 20, 2023 LOS:  LOS: 39 days    Primary Psychiatric Diagnoses  Delirium/Dementia 2.   Acute metabolic encephalopathy 3.   Vit C Deficiency    Assessment  [name] [name] is a 72 y.o. male admitted medically to the hospitalists service on 02/15/23 for confusion and SOB in the context of a recent COVID infection and admitted to acute care on 03/12/2023  4:25 PM for acute metabolic encephalopathy assumed due to recent COVID infection.  He has no past psychiatric diagnosis and has a past medical history of COPD, IDT2DM, GERD, and ESRD. Psychiatry was consulted for medication adjustment - lethargy, agitation, delirium Dr. Gasper Sells aware  by Dr. Shearon Stalls.  The patient's presentation remains most consistent with hypoactive delirium, given relatively acute onset, visual hallucinations, and agitation worse at night. Notably at this point (chronicity), incident dementia also high on differential - both periods of waxing and waning are significantly below premorbid baseline. Most likely due to multiple etiologies including but not limited to infection (recent COVID-19 infection), medications, pain, altered sleep/wake cycle, and limited mobility. Appreciate PT/OT/ST efforts in increasing pt activity during the day.   Patient has had no reported adverse affects to Abilify, which was started the last time psychiatry was involved. This time in discussion with Dr. Shearon Stalls and review of records, he is on the precipice of hospice if he is not able to remain calm during dialysis.   On evaluation today, patient is sitting upright in chair.  He is awake and alert.  He is oriented to person and place with visual prompts in his room.  He is unable to state the date mom to being available for him with encouragement to use.  Patient does not recall having dialysis yesterday.  When reviewed with him that he received his dialysis in his hospital room with his  wife at his bedside and dialysis lasted 3.5 hours he states, "if it is that easy I will have no problems with it".  When asked he then wants to continue dialysis he speaks in a loud affirmative voice, "yes!".  Patient is agreeable to continuing medications for anxiety prior to dialysis.  Review was that he worries about pain in his arm and pain in his chest when he receives dialysis.  Patient specifically denies any SI, HI, AVH.   Reviewed patient's chart which documents a low Depakote level with 3 times daily dosing.  Patient does not have a seizure disorder.  Review of more documents patient did not take her morning medications.  In an attempt to minimize medication burden, will change Depakote 3 times daily to an extended release version at night.  I have placed an order for a lab to be drawn in 4 days.   Psychiatry will continue to follow to evaluate effectiveness of medication and patient being able to complete dialysis.  Wife is in patient room on 04/19/2023, and plans to sit with patient during dialysis today.  She is hopeful that he can discharge with outpatient dialysis through [facility], where she would be able to accompany patient and stay during dialysis treatments.   Minimization of deliriogenic insults will continue to be of utmost importance. This includes promoting the normal circadian cycle, minimizing lines/tubes, avoiding deliriogenic medications such as benzodiazepines and anticholinergic medications, and frequently reorienting the patient. Symptomatic treatment for agitation can be provided by antipsychotic medications, though it is important to remember that these do not treat the underlying etiology  of delirium. Notably, there can be a time lag effect between treatment of a medical problem and resolution of delirium. This time lag effect may be of longer duration in the elderly, and those with underlying cognitive impairment or brain injury.   Diagnoses:  Active Hospital  problems: Principal Problem:   Acute metabolic encephalopathy Active Problems:   Type 2 diabetes mellitus with chronic kidney disease on chronic dialysis, with long-term current use of insulin (HCC)   HFrEF (heart failure with reduced ejection fraction) (HCC)   Morbid obesity (HCC)   Encephalopathy   Epistaxis   PAF (paroxysmal atrial fibrillation) (HCC)   Long term (current) use of insulin (HCC)   ESRD on dialysis (HCC)   Debility   Agitation   Hematuria     Plan   ## Psychiatric Medication Recommendations:  -- Continue Abilify 7.5 mg, PO, daily at bedtime(agitation) -- Continue hydroxyzine once before dialysis (thought he was getting it today) and PRN before dialysis (itching) -- Start lorazepam 1 mg prior to dialysis on Tuesday, Thursday, and Saturday (note if there is a change in dialysis schedule, please adjust lorazepam dose to only be administered prior to dialysis) -- discontinue as needed lorazepam, as this may be disinhibiting.  -- continue hydroxyzine 10 mg 3 times daily as needed for itching and anxiety -- Change to Depakote ER 250 mg at bedtime which can be effective for mood stabilization.  VPA level to be drawn on 04/24/2023 -- Consider memantine if symptoms of delirium do not improve -- Continue Vit C; prefer IV d/t imminence of tx to hospice if no cognitive improvement   ## Medical Decision Making Capacity:  -- Clearly lacks for most decisions; wife is listed as HCPOA  ## Further Work-up:  -- Most recent EKG on 04/19/23 had QtC of 432 --Depakote level to be drawn on 04/24/2023 -- Pertinent labwork reviewed earlier this admission includes: v low vit C    reasonably extensive work-up largely wnl with the exception of labs associated with ESRD including low eGFR, high BUN, and high albumin, all of which are associated with an increased risk of dementia, delirium, and acute metabolic encephalopathy.  ## Disposition:  -- Patient's wife prefers that patient be  discharged to a rehab facility; if not possible she prefers he be discharged home and to transport patient to a dialysis center as needed. No psychiatric contraindications to this plan - defer to PT, OT, and medical assessment. Patient's wife states that a family member can accompany him to dialysis center to help reduce the risk and complications of agitation. Patient has responded well to Abilify and has had no reported significant agitation over the past 48 hours. -- Recommend a TOC consult  ## Behavioral / Environmental:  --  Delirium Precautions: Delirium Interventions for Nursing and Staff: - RN to open blinds every AM. - To Bedside: Glasses and pt's own shoes. Make available to patient when possible and encourage use. - Encourage po fluids when appropriate, keep fluids within reach. - OOB to chair with meals. - Passive ROM exercises to all extremities with AM & PM care. - RN to assess orientation to person, time and place QAM and PRN. - Recommend extended visitation hours with familiar family/friends as feasible. - Staff to minimize disturbances at night. Turn off television when pt asleep or when not in use.  - When patient is awake and watching TV, his wife reports that he likes watching western movies, racing, boxing, or [place]  ## Safety and Observation  Level:  - Based on my clinical evaluation, I estimate the patient to be at low risk of self harm in the current setting - At this time, we recommend a routine level of observation. This decision is based on my review of the chart including patient's history and current presentation, interview of the patient, mental status examination, and consideration of suicide risk including evaluating suicidal ideation, plan, intent, suicidal or self-harm behaviors, risk factors, and protective factors. This judgment is based on our ability to directly address suicide risk, implement suicide prevention strategies and develop a safety plan while the  patient is in the clinical setting. Please contact our team if there is a concern that risk level has changed.  Suicide risk assessment: Patient has following modifiable risk factors for suicide: access to guns, which we are addressing by the patient's wife planning to lock up the guns prior to the patient returning home.   Patient has following non-modifiable or demographic risk factors for suicide: male gender and psychiatric hospitalization  Patient has the following protective factors against suicide: Supportive family, Supportive friends, and no history of suicide attempts  Thank you for this consult request. Recommendations have been communicated to the primary team.  We will continue to follow at this time.   Mariel Craft, MD  Psychiatric and Social History   Relevant Aspects of Hospital Course:  Admitted on 02/15/23 for confusion and SOB in the context of a recent COVID infection. He was found to have sepsis secondary to peritoneal dialysis associated catheter peritonitis and polymicrobial bacteremia/fungemia. Broad spectrum antibiotics started. Hospital course complicated by worsening encephalopathy with hypoxemia requiring transfer to the ICU.   Patient Report:  Patient was awake, alert, oriented to person, and oriented with prompts to location.  He continues to endorse some anxiety related to dialysis, but wants to continue with treatment.  He specifically denies any SI, HI or AVH.  See medication changes above and plan.   Psych ROS:  Today denied SI, HI, AH/VH both historic and current. Not able to do detailed sx inventory.   Depression: In hospital, patient displays signs of sleep disturbance, fatigue, diminished concentration and cognition, and psychomotor changes. 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  Anxiety: Patient did  not express or display concerns for anxiety, but was somnolent throughout interview Mania (lifetime and current): No reported symptoms of mania  Psychosis: (lifetime and current): Patient's wife reports that over the course of his hospital stay, at times the patient had hallucinations about deceased or living family members who are not currently in the room with him. None reported in the past 48 hours.  Collateral information:  10/2 wife consented to PRN hydroxyzine and vit C supplementation.   Contacted patient's wife, [name] Rodelo, at 3:35 pm on 04/04/23. She states that she first noticed changes in the patient's mental status shortly after patient had COVID. She states that he tested positive for COVID at an urgent care in [place] on 01/28/23 and that around that time he began becoming more forgetful. On 02/14/23 the patient's wife observed that he thought he was in the bathroom when he was in the bedroom, and that he had recently been running a fever of 101.3 before she gave him a bath to cool him down, so she called an ambulance. He was transported to [facility] and subsequently  transferred to [facility]. She reports that over the course of his 7-week hospital stay, he has become progressively worse. She reports that in the first few days of admission he was able to request specific television shows to watch, but that he has become progressively somnolent and has had periods with hallucinations. She reports that he has at times called out to deceased or living relatives who were not in the room.   Psychiatric History:  Information collected from patient's wife and EMR  Prev Psych Dx/Sx: None Current Psych Provider: None Home Meds (current): None Previous Psych Med Trials: No past history Therapy: No past history  Prior ECT: Wife denies Prior Psych Hospitalization: Wife denies Prior Self Harm: Wife denies Prior Violence: Wife denies  Family Psych History: Wife states that there is no known  psych history in the patient's family Family Hx suicide: Wife denies  Social History:  Developmental Hx: None on record Educational Hx: Deferred Occupational Hx: Patient worked as a Naval architect for 32 years Legal Hx: Wife states patient currently has no legal issues Living Situation: Lives with wife at home Spiritual Hx: Patient normally attends church every Sunday and participates in Sunday school Access to weapons: Wife states that there are currently guns in the home, but she plans to have them locked up prior to the patient returning home  Substance History Tobacco use: Wife states patient does not use tobacco Alcohol use: Wife states patient does not use alcohol Drug use: Wife states patient does not use drugs   Exam Findings   Psychiatric Specialty Exam: Patient is oriented to self and place. Attention and concentration are good. He has anxious affect. He denies SI, HI, or AVH. He is cooperative throughout the interview and displays no signs of agitation.  Physical Exam: Vital signs:  Temp:  [97.3 F (36.3 C)-97.8 F (36.6 C)] 97.5 F (36.4 C) (10/04 0507) Pulse Rate:  [62-99] 98 (10/04 0507) Resp:  [16-20] 16 (10/04 0805) BP: (112-155)/(61-108) 155/99 (10/04 0507) SpO2:  [90 %-100 %] 95 % (10/04 0805)  Physical Exam Constitutional:      Appearance: Normal appearance. He is obese.  HENT:     Head: Normocephalic and atraumatic.  Eyes:     Conjunctiva/sclera: Conjunctivae normal.  Cardiovascular:     Rate and Rhythm: Normal rate.  Pulmonary:     Effort: Pulmonary effort is normal. No respiratory distress.  Neurological:     General: No focal deficit present.    Blood pressure (!) 155/99, pulse 98, temperature (!) 97.5 F (36.4 C), resp. rate 16, height 5\' 4"  (1.626 m), weight 90.8 kg, SpO2 95%. Body mass index is 34.36 kg/m.   Other History   These have been pulled in through the EMR, reviewed, and updated if appropriate.   Family History:  The patient's  family history includes Arthritis in an other family member; Asthma in an other family member; Cancer in his mother; Diabetes in his father, mother, and sister; Hypertension in his father, mother, and sister; Lung disease in an other family member.  Medical History: Past Medical History:  Diagnosis Date   Anemia    Arthritis    Asthma    Bell palsy    CAD (coronary artery disease)    a. 2014 MV: abnl w/ infap ischemia; b. 03/2013 Cath: aneurysmal bleb in the LAD w/ otw nonobs dzs-->Med Rx.   Chronic back pain    Chronic knee pain    a. 09/2015 s/p R TKA.   Chronic  pain    Chronic shoulder pain    Chronic sinusitis    COPD (chronic obstructive pulmonary disease) (HCC)    Diabetes mellitus without complication (HCC)    type II    ESRD on peritoneal dialysis (HCC)    on peritoneal dialysis, [facility]   Essential hypertension    GERD (gastroesophageal reflux disease)    Gout    Gout    Hepatomegaly    noted on noncontrast CT 2015   History of hiatal hernia    Hyperlipidemia    Lateral meniscus tear    Obesity    Truncal   Obstructive sleep apnea    does not use cpap    On home oxygen therapy    uses 2l when is going somewhere per patient    PUD (peptic ulcer disease)    remote, reports f/u EGD about 8 years ago unremarkable    Reactive airway disease    related to exposure to chemical during 9/11   Sinusitis    Vitamin D deficiency     Surgical History: Past Surgical History:  Procedure Laterality Date   A/V FISTULAGRAM N/A 02/28/2023   Procedure: A/V Fistulagram;  Surgeon: Victorino Sparrow, MD;  Location: [facility] INVASIVE CV LAB;  Service: Cardiovascular;  Laterality: N/A;   ASAD LT SHOULDER  12/15/2008   left shoulder   AV FISTULA PLACEMENT Left 08/09/2016   Procedure: BRACHIOCEPHALIC ARTERIOVENOUS (AV) FISTULA CREATION LEFT ARM;  Surgeon: Sherren Kerns, MD;  Location: [facility] OR;  Service: Vascular;  Laterality: Left;   CAPD INSERTION N/A 10/07/2018   Procedure:  LAPAROSCOPIC PERITONEAL CATHETER PLACEMENT;  Surgeon: Rodman Pickle, MD;  Location: [facility] ORS;  Service: General;  Laterality: N/A;   CATARACT EXTRACTION W/PHACO Left 03/28/2016   Procedure: CATARACT EXTRACTION PHACO AND INTRAOCULAR LENS PLACEMENT LEFT EYE;  Surgeon: Jethro Bolus, MD;  Location: [facility] ORS;  Service: Ophthalmology;  Laterality: Left;  CDE: 4.77   CATARACT EXTRACTION W/PHACO Right 04/11/2016   Procedure: CATARACT EXTRACTION PHACO AND INTRAOCULAR LENS PLACEMENT RIGHT EYE; CDE:  4.74;  Surgeon: Jethro Bolus, MD;  Location: [facility] ORS;  Service: Ophthalmology;  Laterality: Right;   COLONOSCOPY  10/15/2008   Fields: Rectal polyp obliterated, not retrieved, hemorrhoids, single ascending colon diverticulum near the CV. Next colonoscopy April 2020   COLONOSCOPY N/A 12/25/2014   SLF: 1. Colorectal polyps (2) removed 2. Small internal hemorrhoids 3. the left colon is severely redundant. hyperplastic polyps   CORONARY ARTERY BYPASS GRAFT N/A 06/05/2022   Procedure: OFF PUMP CORONARY ARTERY BYPASS GRAFTING (CABG) X 2 BYPASSES USING LEFT INTERNAL MAMMARY ARTERY AND RIGHT LEG GREATER SAPHENOUS VEIN HARVESTED ENDOSCOPICALLY;  Surgeon: Corliss Skains, MD;  Location: [facility] OR;  Service: Open Heart Surgery;  Laterality: N/A;   CORONARY STENT INTERVENTION N/A 07/25/2021   Procedure: CORONARY STENT INTERVENTION;  Surgeon: Tonny Bollman, MD;  Location: [facility] INVASIVE CV LAB;  Service: Cardiovascular;  Laterality: N/A;   CORONARY STENT INTERVENTION N/A 12/26/2021   Procedure: CORONARY STENT INTERVENTION;  Surgeon: Yvonne Kendall, MD;  Location: [facility] INVASIVE CV LAB;  Service: Cardiovascular;  Laterality: N/A;   CORONARY STENT INTERVENTION N/A 01/20/2022   Procedure: CORONARY STENT INTERVENTION;  Surgeon: Tonny Bollman, MD;  Location: [facility] INVASIVE CV LAB;  Service: Cardiovascular;  Laterality: N/A;   DOPPLER ECHOCARDIOGRAPHY     ESOPHAGOGASTRODUODENOSCOPY N/A 12/25/2014   SLF: 1. Anemia most likely due to  CRI, gastritis, gastric polyps 2. Moderate non-erosive gastriits and mild duodenitis.  3.TWo large gstric polyps removed.  EYE SURGERY  12/22/2010   tear duct probing-[place]   FOREIGN BODY REMOVAL  03/29/2011   Procedure: REMOVAL FOREIGN BODY EXTREMITY;  Surgeon: Fuller Canada, MD;  Location: [facility] ORS;  Service: Orthopedics;  Laterality: Right;  Removal Foreign Body Right Thumb   IR FLUORO GUIDE CV LINE RIGHT  08/06/2018   IR FLUORO GUIDE CV LINE RIGHT  02/17/2023   IR US GUIDE VASC ACCESS RIGHT  08/06/2018   IR US GUIDE VASC ACCESS RIGHT  02/17/2023   KNEE ARTHROSCOPY  10/16/2007   left   KNEE ARTHROSCOPY WITH LATERAL MENISECTOMY Right 10/14/2015   Procedure: LEFT KNEE ARTHROSCOPY WITH PARTIAL LATERAL MENISECTOMY;  Surgeon: Vickki Hearing, MD;  Location: [facility] ORS;  Service: Orthopedics;  Laterality: Right;   LEFT HEART CATH AND CORONARY ANGIOGRAPHY N/A 07/25/2021   Procedure: LEFT HEART CATH AND CORONARY ANGIOGRAPHY;  Surgeon: Tonny Bollman, MD;  Location: [facility] INVASIVE CV LAB;  Service: Cardiovascular;  Laterality: N/A;   LEFT HEART CATH AND CORONARY ANGIOGRAPHY N/A 12/26/2021   Procedure: LEFT HEART CATH AND CORONARY ANGIOGRAPHY;  Surgeon: Yvonne Kendall, MD;  Location: [facility] INVASIVE CV LAB;  Service: Cardiovascular;  Laterality: N/A;   LEFT HEART CATH AND CORONARY ANGIOGRAPHY N/A 01/20/2022   Procedure: LEFT HEART CATH AND CORONARY ANGIOGRAPHY;  Surgeon: Tonny Bollman, MD;  Location: [facility] INVASIVE CV LAB;  Service: Cardiovascular;  Laterality: N/A;   LEFT HEART CATHETERIZATION WITH CORONARY ANGIOGRAM N/A 03/28/2013   Procedure: LEFT HEART CATHETERIZATION WITH CORONARY ANGIOGRAM;  Surgeon: Marykay Lex, MD;  Location: [facility] CATH LAB;  Service: Cardiovascular;  Laterality: N/A;   NM MYOVIEW LTD     PENILE PROSTHESIS IMPLANT N/A 08/16/2015   Procedure: PENILE PROTHESIS INFLATABLE, three piece, Excisional biopsy of Penile ulcer, Penile molding;  Surgeon: Jethro Bolus, MD;  Location: [facility]  ORS;  Service: Urology;  Laterality: N/A;   PENILE PROSTHESIS IMPLANT N/A 12/24/2017   Procedure: REMOVAL AND  REPLACEMENT  COLOPLAST PENILE PROSTHESIS;  Surgeon: Crista Elliot, MD;  Location: [facility] ORS;  Service: Urology;  Laterality: N/A;   PERIPHERAL VASCULAR BALLOON ANGIOPLASTY  02/28/2023   Procedure: PERIPHERAL VASCULAR BALLOON ANGIOPLASTY;  Surgeon: Victorino Sparrow, MD;  Location: [facility] INVASIVE CV LAB;  Service: Cardiovascular;;   PORT-A-CATH REMOVAL N/A 02/19/2023   Procedure: REMOVAL OF PERITONEAL DIALYSIS CATHETER;  Surgeon: Victorino Sparrow, MD;  Location: [facility] OR;  Service: Vascular;  Laterality: N/A;   QUADRICEPS TENDON REPAIR  07/21/2011   Procedure: REPAIR QUADRICEP TENDON;  Surgeon: Fuller Canada, MD;  Location: [facility] ORS;  Service: Orthopedics;  Laterality: Right;   RIGHT/LEFT HEART CATH AND CORONARY ANGIOGRAPHY N/A 05/30/2022   Procedure: RIGHT/LEFT HEART CATH AND CORONARY ANGIOGRAPHY;  Surgeon: Corky Crafts, MD;  Location: [facility] INVASIVE CV LAB;  Service: Cardiovascular;  Laterality: N/A;   TEE WITHOUT CARDIOVERSION N/A 06/05/2022   Procedure: TRANSESOPHAGEAL ECHOCARDIOGRAM (TEE);  Surgeon: Corliss Skains, MD;  Location: [facility] OR;  Service: Open Heart Surgery;  Laterality: N/A;   TOENAIL EXCISION     removed x2-bilateral   UMBILICAL HERNIA REPAIR  07/17/2005   [place]    Medications:   Current Facility-Administered Medications:    (feeding supplement) PROSource Plus liquid 30 mL, 30 mL, Oral, BID BM, Stovall, Kathryn R, PA-C, 30 mL at 04/19/23 1053   0.9 %  sodium chloride infusion, , Intravenous, PRN, Angelina Sheriff, DO, Last Rate: 10 mL/hr at 04/18/23 1515, New Bag at 04/18/23 1515   acetaminophen (TYLENOL) tablet 325-650 mg, 325-650 mg, Oral, Q4H PRN,  Milinda Antis, PA-C, 650 mg at 04/12/23 2036   ARIPiprazole (ABILIFY) tablet 7.5 mg, 7.5 mg, Oral, QHS, Fanny Dance, MD, 7.5 mg at 04/19/23 2258   [START ON 05/02/2023] ascorbic acid (VITAMIN C) tablet 500  mg, 500 mg, Oral, Daily, Angelina Sheriff, DO   aspirin EC tablet 81 mg, 81 mg, Oral, Daily, Milinda Antis, PA-C, 81 mg at 04/19/23 0844   atorvastatin (LIPITOR) tablet 80 mg, 80 mg, Oral, Daily, Milinda Antis, PA-C, 80 mg at 04/19/23 0844   bisacodyl (DULCOLAX) EC tablet 5 mg, 5 mg, Oral, Daily PRN, Valetta Fuller, Sandra J, PA-C   Chlorhexidine Gluconate Cloth 2 % PADS 6 each, 6 each, Topical, Q0600, Ejigiri, Ogechi Grace, PA-C   Chlorhexidine Gluconate Cloth 2 % PADS 6 each, 6 each, Topical, Q0600, Penninger, Lillia Abed, PA   Darbepoetin Alfa (ARANESP) injection 150 mcg, 150 mcg, Subcutaneous, Q Sun-1800, Paytes, Austin A, RPH, 150 mcg at 04/15/23 1959   dextrose 5 % 100 mL with ascorbic acid (VITAMIN C) 200 mg infusion, , Intravenous, Q T,Th,Sat-1800, Engler, Morgan C, DO, Stopped at 04/19/23 1956   divalproex (DEPAKOTE SPRINKLE) capsule 125 mg, 125 mg, Oral, Q8H, Fanny Dance, MD, 125 mg at 04/19/23 2258   feeding supplement (ENSURE ENLIVE / ENSURE PLUS) liquid 237 mL, 237 mL, Oral, BID WC, Setzer, Sandra J, PA-C, 237 mL at 04/18/23 1024   Gerhardt's butt cream 1 Application, 1 Application, Topical, QID, Setzer, Lynnell Jude, PA-C, 1 Application at 04/18/23 2105   guaiFENesin-dextromethorphan (ROBITUSSIN DM) 100-10 MG/5ML syrup 10 mL, 10 mL, Oral, Q6H PRN, Milinda Antis, PA-C, 10 mL at 04/03/23 1633   hydrOXYzine (ATARAX) tablet 10 mg, 10 mg, Oral, TID PRN, Elijah Birk C, DO, 10 mg at 04/19/23 1212   lidocaine (LIDODERM) 5 % 1 patch, 1 patch, Transdermal, Q2200, Setzer, Lynnell Jude, PA-C, 1 patch at 04/12/23 2035   lidocaine (LIDODERM) 5 % 1 patch, 1 patch, Transdermal, Q24H, Skip Mayer A, MD, 1 patch at 04/15/23 2000   LORazepam (ATIVAN) tablet 0.5 mg, 0.5 mg, Oral, Daily PRN, Elijah Birk C, DO, 0.5 mg at 04/18/23 2140   mometasone-formoterol (DULERA) 200-5 MCG/ACT inhaler 2 puff, 2 puff, Inhalation, BID, Setzer, Lynnell Jude, PA-C, 2 puff at 04/20/23 0805   Muscle Rub CREA, , Topical,  PRN, Fanny Dance, MD   ondansetron [facility]) tablet 4 mg, 4 mg, Oral, Q6H PRN, 4 mg at 03/16/23 2154 **OR** ondansetron (ZOFRAN) injection 4 mg, 4 mg, Intravenous, Q6H PRN, Setzer, Lynnell Jude, PA-C, 4 mg at 04/03/23 1603   oxyCODONE (Oxy IR/ROXICODONE) immediate release tablet 2.5 mg, 2.5 mg, Oral, TID PRN, Elijah Birk C, DO, 2.5 mg at 04/15/23 1122   pantoprazole (PROTONIX) EC tablet 40 mg, 40 mg, Oral, BID, Setzer, Lynnell Jude, PA-C, 40 mg at 04/19/23 2258   traZODone (DESYREL) tablet 50 mg, 50 mg, Oral, QHS PRN, Ranelle Oyster, MD, 50 mg at 04/18/23 2221   traZODone (DESYREL) tablet 50 mg, 50 mg, Oral, QHS, Ranelle Oyster, MD, 50 mg at 04/19/23 2258  Allergies: Allergies  Allergen Reactions   Opana [Oxymorphone Hcl] Itching   Tramadol Itching

## 2023-04-20 NOTE — Plan of Care (Signed)
  Problem: RH Balance Goal: LTG: Patient will maintain dynamic sitting balance (OT) Description: LTG:  Patient will maintain dynamic sitting balance with assistance during activities of daily living (OT) Flowsheets (Taken 04/20/2023 1742) LTG: Pt will maintain dynamic sitting balance during ADLs with: Minimal Assistance - Patient > 75% Note: Goal downgraded 10/4 due to lack of progress-ESD Goal: LTG Patient will maintain dynamic standing with ADLs (OT) Description: LTG:  Patient will maintain dynamic standing balance with assist during activities of daily living (OT)  Flowsheets (Taken 04/20/2023 1742) LTG: Pt will maintain dynamic standing balance during ADLs with: Moderate Assistance - Patient 50 - 74% Note: Goal downgraded 10/4 due to lack of progress-ESD   Problem: Sit to Stand Goal: LTG:  Patient will perform sit to stand in prep for activites of daily living with assistance level (OT) Description: LTG:  Patient will perform sit to stand in prep for activites of daily living with assistance level (OT) Flowsheets (Taken 04/20/2023 1742) LTG: PT will perform sit to stand in prep for activites of daily living with assistance level: Moderate Assistance - Patient 50 - 74% Note: Goal downgraded 10/4 due to lack of progress-ESD   Problem: RH Toilet Transfers Goal: LTG Patient will perform toilet transfers w/assist (OT) Description: LTG: Patient will perform toilet transfers with assist, with/without cues using equipment (OT) Flowsheets (Taken 04/20/2023 1742) LTG: Pt will perform toilet transfers with assistance level of: Moderate Assistance - Patient 50 - 74% Note: Goal downgraded 10/4 due to lack of progress-ESD

## 2023-04-20 NOTE — Progress Notes (Incomplete)
Occupational Therapy Session Note  Patient Details  Name: DANI DANIS MRN: [id number] Date of Birth: 10/29/1950  {[facility] IP REHAB OT TIME CALCULATIONS:[id number]}   Short Term Goals: Week 4:  OT Short Term Goal 1 (Week 4): To continue to consistently meet LTG.  Skilled Therapeutic Interventions/Progress Updates:    Patient agreeable to participate in OT session. Reports *** pain level.   Patient participated in skilled OT session focusing on ***. Therapist facilitated/assessed/developed/educated/integrated/elicited *** in order to improve/facilitate/promote    Therapy Documentation Precautions:  Precautions Precautions: Fall Restrictions Weight Bearing Restrictions: No  Therapy/Group: Individual Therapy  Limmie Patricia, OTR/L,CBIS  Supplemental OT - [facility] and [facility] Secure Chat Preferred   04/20/2023, 10:17 PM

## 2023-04-20 NOTE — Progress Notes (Signed)
Physical Therapy Weekly Progress Note  Patient Details  Name: [name] [name] MRN: [id number] Date of Birth: [birth year]  Beginning of progress report period: April 13, 2023 End of progress report period: April 20, 2023  Today's Date: 04/20/2023 PT Individual Time: 0835-0900 PT Individual Time Calculation (min): 25 min   Pt continues to work towards consistency with long term goals.  Pt demonstrating fluctuating progress towards functional goals, with pt progress limited due to inconsistent alertness and variable agitation. Pt currently able to transfer with as little as min assist or as much as max assist depending on presentation. Pt able to ambulate up to 22' with mod assist and 2nd person WC follow. Pt caregiver was not present this week during sessions for caregiver training but would benefit from training prior to DC.  Patient continues to demonstrate the following deficits muscle weakness, decreased cardiorespiratoy endurance, impaired timing and sequencing, abnormal tone, unbalanced muscle activation, decreased coordination, and decreased motor planning, decreased visual perceptual skills, decreased midline orientation, decreased initiation, decreased attention, decreased awareness, decreased problem solving, decreased safety awareness, decreased memory, and delayed processing, and decreased sitting balance, decreased standing balance, decreased postural control, and decreased balance strategies and therefore will continue to benefit from skilled PT intervention to increase functional independence with mobility.  Patient not progressing toward long term goals.  See goal revision..  Plan of care revisions: discharged stair goal, downgraded ambulation and WC mobility goals, added caregiver goals .  PT Short Term Goals Week 5:  PT Short Term Goal 1 (Week 5): To continue to consistently meet LTG Week 6:  PT Short Term Goal 1 (Week 6): To continue to consistently meet LTG  Skilled  Therapeutic Interventions/Progress Updates:  Pt presents in [facility] behind nurse's station, agreeable to PT. Pt denies pain. Session focused on gait training for tolerance to upright and BLE strengthening and sequencing needed for functional transfers.  Pt transported to day room dependently for energy conservation. Pt ambulates 3' and 20' with R HHA and 2nd person WC follow, min assist progress to mod assist with fatigue with pt demonstrating excessive forward trunk lean and LOB. Pt cued to stand upright and look forward during ambulation with pt able to complete for a short distance. Pt requires frequent standing rest breaks.  Pt required extended seated rest breaks due to fatigue between gait trials.  Pt returned to nurses station and remains seated in [facility] with chair alarm donned and activated at end of session.  Ambulation/gait training;Community reintegration;DME/adaptive equipment instruction;Psychosocial support;Stair training;Neuromuscular re-education;Wheelchair propulsion/positioning;UE/LE Strength taining/ROM;Balance/vestibular training;Discharge planning;Pain management;Therapeutic Activities;UE/LE Coordination activities;Cognitive remediation/compensation;Functional mobility training;Disease management/prevention;Patient/family education;Therapeutic Exercise;Visual/perceptual remediation/compensation   Therapy Documentation Precautions:  Precautions Precautions: Fall Restrictions Weight Bearing Restrictions: No   Therapy/Group: Individual Therapy  Edwin Cap PT, DPT 04/20/2023, 9:02 AM

## 2023-04-20 NOTE — Consult Note (Signed)
[facility] [place]  ASSESSMENT / PLAN: 72 y.o. male with end-stage renal disease.  He is currently dialyzing through a left upper arm fistula.  He has been agitated and not been able to comply perfectly with dialysis treatments.  He recently had peritoneal dialysis catheter removed for peritonitis (02/19/23).  His wife is hopeful to transition back to peritoneal dialysis, as she thinks this will help with his agitation.  I do not recommend placement of a peritoneal dialysis catheter.  I think even diagnostic laparoscopy would expose him to unnecessary risk of bowel and vascular injury.  I do not think peritoneal dialysis is likely to work for him in the long-term, and would only consider revisiting this after 6 months of resting the abdomen.  I reviewed this with the wife at the bedside, who is understanding.  CHIEF COMPLAINT: Agitation with dialysis  HISTORY OF PRESENT ILLNESS: [name] [name] is a 72 y.o. male admitted to inpatient rehab.  Patient is well-known to our service.  He was admitted in early August for PD catheter associated peritonitis.  His PD catheter was removed on 02/19/2023 by Dr. Karin Lieu.  Dr. Karin Lieu then performed a fistulogram with venoplasty 02/28/2023.  He has been dialyzing through the left arm arteriovenous fistula since that time.  He has had significant behavioral issues and issues with agitation.  He has been disqualified from transfer to long-term care facilities because of his agitation.  His wife is hopeful that he can transition home with a peritoneal dialysis catheter, and feels like she can manage his agitation at home.  I reviewed my concerns with placement of a new dialysis catheter this soon after peritoneal dialysis catheter peritonitis.  I am mostly concerned about potential for bowel or vascular injury while insufflating the abdomen.  I also do not think this modality is likely to be successful for him long-term.  Past Medical History:   Diagnosis Date   Anemia    Arthritis    Asthma    Bell palsy    CAD (coronary artery disease)    a. 2014 MV: abnl w/ infap ischemia; b. 03/2013 Cath: aneurysmal bleb in the LAD w/ otw nonobs dzs-->Med Rx.   Chronic back pain    Chronic knee pain    a. 09/2015 s/p R TKA.   Chronic pain    Chronic shoulder pain    Chronic sinusitis    COPD (chronic obstructive pulmonary disease) (HCC)    Diabetes mellitus without complication (HCC)    type II    ESRD on peritoneal dialysis (HCC)    on peritoneal dialysis, [facility]   Essential hypertension    GERD (gastroesophageal reflux disease)    Gout    Gout    Hepatomegaly    noted on noncontrast CT 2015   History of hiatal hernia    Hyperlipidemia    Lateral meniscus tear    Obesity    Truncal   Obstructive sleep apnea    does not use cpap    On home oxygen therapy    uses 2l when is going somewhere per patient    PUD (peptic ulcer disease)    remote, reports f/u EGD about 8 years ago unremarkable    Reactive airway disease    related to exposure to chemical during 9/11   Sinusitis    Vitamin D deficiency     Past Surgical History:  Procedure Laterality Date   A/V FISTULAGRAM N/A 02/28/2023   Procedure: A/V Fistulagram;  Surgeon: Victorino Sparrow, MD;  Location: [facility] INVASIVE CV LAB;  Service: Cardiovascular;  Laterality: N/A;   ASAD LT SHOULDER  12/15/2008   left shoulder   AV FISTULA PLACEMENT Left 08/09/2016   Procedure: BRACHIOCEPHALIC ARTERIOVENOUS (AV) FISTULA CREATION LEFT ARM;  Surgeon: Sherren Kerns, MD;  Location: [facility] OR;  Service: Vascular;  Laterality: Left;   CAPD INSERTION N/A 10/07/2018   Procedure: LAPAROSCOPIC PERITONEAL CATHETER PLACEMENT;  Surgeon: Rodman Pickle, MD;  Location: [facility] ORS;  Service: General;  Laterality: N/A;   CATARACT EXTRACTION W/PHACO Left 03/28/2016   Procedure: CATARACT EXTRACTION PHACO AND INTRAOCULAR LENS PLACEMENT LEFT EYE;  Surgeon: Jethro Bolus, MD;  Location: [facility] ORS;   Service: Ophthalmology;  Laterality: Left;  CDE: 4.77   CATARACT EXTRACTION W/PHACO Right 04/11/2016   Procedure: CATARACT EXTRACTION PHACO AND INTRAOCULAR LENS PLACEMENT RIGHT EYE; CDE:  4.74;  Surgeon: Jethro Bolus, MD;  Location: [facility] ORS;  Service: Ophthalmology;  Laterality: Right;   COLONOSCOPY  10/15/2008   Fields: Rectal polyp obliterated, not retrieved, hemorrhoids, single ascending colon diverticulum near the CV. Next colonoscopy April 2020   COLONOSCOPY N/A 12/25/2014   SLF: 1. Colorectal polyps (2) removed 2. Small internal hemorrhoids 3. the left colon is severely redundant. hyperplastic polyps   CORONARY ARTERY BYPASS GRAFT N/A 06/05/2022   Procedure: OFF PUMP CORONARY ARTERY BYPASS GRAFTING (CABG) X 2 BYPASSES USING LEFT INTERNAL MAMMARY ARTERY AND RIGHT LEG GREATER SAPHENOUS VEIN HARVESTED ENDOSCOPICALLY;  Surgeon: Corliss Skains, MD;  Location: [facility] OR;  Service: Open Heart Surgery;  Laterality: N/A;   CORONARY STENT INTERVENTION N/A 07/25/2021   Procedure: CORONARY STENT INTERVENTION;  Surgeon: Tonny Bollman, MD;  Location: [facility] INVASIVE CV LAB;  Service: Cardiovascular;  Laterality: N/A;   CORONARY STENT INTERVENTION N/A 12/26/2021   Procedure: CORONARY STENT INTERVENTION;  Surgeon: Yvonne Kendall, MD;  Location: [facility] INVASIVE CV LAB;  Service: Cardiovascular;  Laterality: N/A;   CORONARY STENT INTERVENTION N/A 01/20/2022   Procedure: CORONARY STENT INTERVENTION;  Surgeon: Tonny Bollman, MD;  Location: [facility] INVASIVE CV LAB;  Service: Cardiovascular;  Laterality: N/A;   DOPPLER ECHOCARDIOGRAPHY     ESOPHAGOGASTRODUODENOSCOPY N/A 12/25/2014   SLF: 1. Anemia most likely due to CRI, gastritis, gastric polyps 2. Moderate non-erosive gastriits and mild duodenitis.  3.TWo large gstric polyps removed.    EYE SURGERY  12/22/2010   tear duct probing-[place]   FOREIGN BODY REMOVAL  03/29/2011   Procedure: REMOVAL FOREIGN BODY EXTREMITY;  Surgeon: Fuller Canada, MD;  Location: [facility]S;   Service: Orthopedics;  Laterality: Right;  Removal Foreign Body Right Thumb   IR FLUORO GUIDE CV LINE RIGHT  08/06/2018   IR FLUORO GUIDE CV LINE RIGHT  02/17/2023   IR US GUIDE VASC ACCESS RIGHT  08/06/2018   IR US GUIDE VASC ACCESS RIGHT  02/17/2023   KNEE ARTHROSCOPY  10/16/2007   left   KNEE ARTHROSCOPY WITH LATERAL MENISECTOMY Right 10/14/2015   Procedure: LEFT KNEE ARTHROSCOPY WITH PARTIAL LATERAL MENISECTOMY;  Surgeon: Vickki Hearing, MD;  Location: [facility];  Service: Orthopedics;  Laterality: Right;   LEFT HEART CATH AND CORONARY ANGIOGRAPHY N/A 07/25/2021   Procedure: LEFT HEART CATH AND CORONARY ANGIOGRAPHY;  Surgeon: Tonny Bollman, MD;  Location: [facility] INVASIVE CV LAB;  Service: Cardiovascular;  Laterality: N/A;   LEFT HEART CATH AND CORONARY ANGIOGRAPHY N/A 12/26/2021   Procedure: LEFT HEART CATH AND CORONARY ANGIOGRAPHY;  Surgeon: Yvonne Kendall, MD;  Location: [facility] INVASIVE CV LAB;  Service: Cardiovascular;  Laterality: N/A;  LEFT HEART CATH AND CORONARY ANGIOGRAPHY N/A 01/20/2022   Procedure: LEFT HEART CATH AND CORONARY ANGIOGRAPHY;  Surgeon: Tonny Bollman, MD;  Location: [facility] INVASIVE CV LAB;  Service: Cardiovascular;  Laterality: N/A;   LEFT HEART CATHETERIZATION WITH CORONARY ANGIOGRAM N/A 03/28/2013   Procedure: LEFT HEART CATHETERIZATION WITH CORONARY ANGIOGRAM;  Surgeon: Marykay Lex, MD;  Location: [facility] CATH LAB;  Service: Cardiovascular;  Laterality: N/A;   NM MYOVIEW LTD     PENILE PROSTHESIS IMPLANT N/A 08/16/2015   Procedure: PENILE PROTHESIS INFLATABLE, three piece, Excisional biopsy of Penile ulcer, Penile molding;  Surgeon: Jethro Bolus, MD;  Location: [facility] ORS;  Service: Urology;  Laterality: N/A;   PENILE PROSTHESIS IMPLANT N/A 12/24/2017   Procedure: REMOVAL AND  REPLACEMENT  COLOPLAST PENILE PROSTHESIS;  Surgeon: Crista Elliot, MD;  Location: [facility]S;  Service: Urology;  Laterality: N/A;   PERIPHERAL VASCULAR BALLOON ANGIOPLASTY  02/28/2023   Procedure:  PERIPHERAL VASCULAR BALLOON ANGIOPLASTY;  Surgeon: Victorino Sparrow, MD;  Location: [facility] INVASIVE CV LAB;  Service: Cardiovascular;;   PORT-A-CATH REMOVAL N/A 02/19/2023   Procedure: REMOVAL OF PERITONEAL DIALYSIS CATHETER;  Surgeon: Victorino Sparrow, MD;  Location: [facility] OR;  Service: Vascular;  Laterality: N/A;   QUADRICEPS TENDON REPAIR  07/21/2011   Procedure: REPAIR QUADRICEP TENDON;  Surgeon: Fuller Canada, MD;  Location: [facility]S;  Service: Orthopedics;  Laterality: Right;   RIGHT/LEFT HEART CATH AND CORONARY ANGIOGRAPHY N/A 05/30/2022   Procedure: RIGHT/LEFT HEART CATH AND CORONARY ANGIOGRAPHY;  Surgeon: Corky Crafts, MD;  Location: [facility] INVASIVE CV LAB;  Service: Cardiovascular;  Laterality: N/A;   TEE WITHOUT CARDIOVERSION N/A 06/05/2022   Procedure: TRANSESOPHAGEAL ECHOCARDIOGRAM (TEE);  Surgeon: Corliss Skains, MD;  Location: [facility] OR;  Service: Open Heart Surgery;  Laterality: N/A;   TOENAIL EXCISION     removed x2-bilateral   UMBILICAL HERNIA REPAIR  07/17/2005   [place]    Family History  Problem Relation Age of Onset   Hypertension Mother        MI   Cancer Mother        breast    Diabetes Mother    Diabetes Father    Hypertension Father    Hypertension Sister    Diabetes Sister    Arthritis Other    Asthma Other    Lung disease Other    Anesthesia problems Neg Hx    Hypotension Neg Hx    Malignant hyperthermia Neg Hx    Pseudochol deficiency Neg Hx    Colon cancer Neg Hx     Social History   Socioeconomic History   Marital status: Married    Spouse name: Not on file   Number of children: 2   Years of education: 12th grade   Highest education level: Not on file  Occupational History   Occupation: disabled   Occupation:      Associate Professor: UNEMPLOYED  Tobacco Use   Smoking status: Former    Current packs/day: 0.00    Average packs/day: 1 pack/day for 25.0 years (25.0 ttl pk-yrs)    Types: Cigarettes    Start date: 03/27/1985    Quit date: 03/27/2010     Years since quitting: 13.0   Smokeless tobacco: Never   Tobacco comments:    Quit x 7 years  Vaping Use   Vaping status: Never Used  Substance and Sexual Activity   Alcohol use: Not Currently    Comment: occasionally   Drug use: Yes    Types: Marijuana  Comment: cocaine- last time used- 11/24/2017 , marijuana-    Sexual activity: Yes  Other Topics Concern   Not on file  Social History Narrative   He quit smoking in 2010. 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 He developed pulmonary problems, became disabled because of lower airway disease in 2009.       WATCHES BASKETBALL. HIS TEAM IS [place].   Social Determinants of Health   Financial Resource Strain: Low Risk  (03/05/2023)   Received from [facility]   Overall Financial Resource Strain (CARDIA)    Difficulty of Paying Living Expenses: Not hard at all  Food Insecurity: No Food Insecurity (03/05/2023)   Received from [facility]   Hunger Vital Sign    Worried About Running Out of Food in the Last Year: Never true    Ran Out of Food in the Last Year: Never true  Transportation Needs: No Transportation Needs (03/12/2023)   Received from [facility]   SM SDOH Transportation Source    Has lack of transportation kept you from medical appointments or from getting medications?: No    Has lack of transportation kept you from meetings, work, or from getting things needed for daily living?: No  Physical Activity: Sufficiently Active (12/29/2021)   Exercise Vital Sign    Days of Exercise per Week: 3 days    Minutes of Exercise per Session: 60 min  Stress: Patient Declined (03/12/2023)   Received from [facility]   [employer] of Occupational Health - Occupational Stress Questionnaire    Feeling of Stress : Patient declined  Social Connections: Moderately Integrated (03/05/2023)   Received from [facility]   Social Connection and Isolation Panel [NHANES]    Frequency of  Communication with Friends and Family: More than three times a week    Frequency of Social Gatherings with Friends and Family: More than three times a week    Attends Religious Services: More than 4 times per year    Active Member of [employer] or Organizations: No    Attends Banker Meetings: Never    Marital Status: Married  Catering manager Violence: Not At Risk (03/02/2023)   Received from [facility]   Domestic Abuse Assessment    Do you feel safe in your relationships at home?: Yes    Physical Abuse: Denies    HRSN Domestic Abuse - Type of Abuse: Not on file    HRSN Domestic Abuse - Time Frame: Not on file    HRSN Domestic Abuse - Signs and Symptoms: Not on file    Verbal Abuse: Denies    HRSN Domestic Abuse - Reported To: Not on file    Allergies  Allergen Reactions   Opana [Oxymorphone Hcl] Itching   Tramadol Itching    Current Facility-Administered Medications  Medication Dose Route Frequency Provider Last Rate Last Admin   (feeding supplement) PROSource Plus liquid 30 mL  30 mL Oral BID BM Julien Nordmann, PA-C   30 mL at 04/20/23 1006   0.9 %  sodium chloride infusion   Intravenous PRN Elijah Birk C, DO 10 mL/hr at 04/18/23 1515 New Bag at 04/18/23 1515   acetaminophen (TYLENOL) tablet 325-650 mg  325-650 mg Oral Q4H PRN Milinda Antis, PA-C   650 mg at 04/12/23 2036   ARIPiprazole (ABILIFY) tablet 7.5 mg  7.5 mg Oral QHS Fanny Dance, MD   7.5 mg at 04/19/23 2258   [START ON 05/02/2023] ascorbic  acid (VITAMIN C) tablet 500 mg  500 mg Oral Daily Angelina Sheriff, DO       aspirin EC tablet 81 mg  81 mg Oral Daily Milinda Antis, PA-C   81 mg at 04/20/23 1006   atorvastatin (LIPITOR) tablet 80 mg  80 mg Oral Daily Milinda Antis, PA-C   80 mg at 04/20/23 1006   bisacodyl (DULCOLAX) EC tablet 5 mg  5 mg Oral Daily PRN Milinda Antis, PA-C       Chlorhexidine Gluconate Cloth 2 % PADS 6 each  6 each Topical Q0600 Oretha Milch, PA-C   6 each  at 04/20/23 1713   Darbepoetin Alfa (ARANESP) injection 150 mcg  150 mcg Subcutaneous Q Sun-1800 Paytes, Austin A, RPH   150 mcg at 04/15/23 1959   dextrose 5 % 100 mL with ascorbic acid (VITAMIN C) 200 mg infusion   Intravenous Q T,Th,Sat-1800 Angelina Sheriff, DO   Stopped at 04/19/23 1956   divalproex (DEPAKOTE ER) 24 hr tablet 250 mg  250 mg Oral QHS Mariel Craft, MD       feeding supplement (ENSURE ENLIVE / ENSURE PLUS) liquid 237 mL  237 mL Oral BID WC Milinda Antis, PA-C   237 mL at 04/20/23 1242   Gerhardt's butt cream 1 Application  1 Application Topical QID Milinda Antis, PA-C   1 Application at 04/20/23 1341   guaiFENesin-dextromethorphan (ROBITUSSIN DM) 100-10 MG/5ML syrup 10 mL  10 mL Oral Q6H PRN Milinda Antis, PA-C   10 mL at 04/03/23 1633   hydrOXYzine (ATARAX) tablet 10 mg  10 mg Oral TID PRN Elijah Birk C, DO   10 mg at 04/20/23 1236   lidocaine (LIDODERM) 5 % 1 patch  1 patch Transdermal Q2200 Milinda Antis, PA-C   1 patch at 04/12/23 2035   lidocaine (LIDODERM) 5 % 1 patch  1 patch Transdermal Q24H Skip Mayer A, MD   1 patch at 04/15/23 2000   [START ON 04/21/2023] LORazepam (ATIVAN) tablet 1 mg  1 mg Oral Q T,Th,Sa-HD Mariel Craft, MD       mometasone-formoterol [facility]) 200-5 MCG/ACT inhaler 2 puff  2 puff Inhalation BID Milinda Antis, PA-C   2 puff at 04/20/23 9562   Muscle Rub CREA   Topical PRN Fanny Dance, MD       ondansetron [facility]) tablet 4 mg  4 mg Oral Q6H PRN Milinda Antis, PA-C   4 mg at 03/16/23 2154   Or   ondansetron (ZOFRAN) injection 4 mg  4 mg Intravenous Q6H PRN Milinda Antis, PA-C   4 mg at 04/03/23 1603   oxyCODONE (Oxy IR/ROXICODONE) immediate release tablet 2.5 mg  2.5 mg Oral TID PRN Elijah Birk C, DO   2.5 mg at 04/15/23 1122   pantoprazole (PROTONIX) EC tablet 40 mg  40 mg Oral BID Milinda Antis, PA-C   40 mg at 04/20/23 1006   traZODone (DESYREL) tablet 50 mg  50 mg Oral QHS PRN Ranelle Oyster, MD    50 mg at 04/18/23 2221   traZODone (DESYREL) tablet 50 mg  50 mg Oral QHS Faith Rogue T, MD   50 mg at 04/19/23 2258    PHYSICAL EXAM Vitals:   04/19/23 2200 04/20/23 0507 04/20/23 0805 04/20/23 1555  BP:  (!) 155/99  121/78  Pulse:  98  90  Resp:  20 16 16   Temp:  (!) 97.5 F (36.4  C)  97.7 F (36.5 C)  TempSrc:    Oral  SpO2: 96% 98% 95% 97%  Weight:      Height:       Elderly man in no acute distress In a wheelchair Right arm AV fistula with thrill Abdomen soft and nontender  PERTINENT LABORATORY AND RADIOLOGIC DATA  Most recent CBC    Latest Ref Rng & Units 04/18/2023   10:01 AM 04/14/2023   11:52 AM 04/11/2023   10:08 AM  CBC  WBC 4.0 - 10.5 K/uL 4.3  4.3  4.5   Hemoglobin 13.0 - 17.0 g/dL 81.1  91.4  9.5   Hematocrit 39.0 - 52.0 % 33.6  35.1  31.9   Platelets 150 - 400 K/uL 220  242  186      Most recent CMP    Latest Ref Rng & Units 04/18/2023   10:01 AM 04/14/2023   11:52 AM 04/11/2023   10:08 AM  CMP  Glucose 70 - 99 mg/dL 782  956  213   BUN 8 - 23 mg/dL 36  50  45   Creatinine 0.61 - 1.24 mg/dL 0.86  57.84  69.62   Sodium 135 - 145 mmol/L 134  133  136   Potassium 3.5 - 5.1 mmol/L 3.9  4.7  4.2   Chloride 98 - 111 mmol/L 94  94  97   CO2 22 - 32 mmol/L 25  23  25    Calcium 8.9 - 10.3 mg/dL 9.2  9.3  9.1   Total Protein 6.5 - 8.1 g/dL 6.9     Total Bilirubin 0.3 - 1.2 mg/dL 0.8     Alkaline Phos 38 - 126 U/L 78     AST 15 - 41 U/L 30     ALT 0 - 44 U/L 21       Renal function Estimated Creatinine Clearance: 7.1 mL/min (A) (by C-G formula based on SCr of 9.5 mg/dL (H)).  Hemoglobin A1C (no units)  Date Value  12/17/2021 6.3   Hgb A1c MFr Bld (%)  Date Value  03/04/2023 6.3 (H)    LDL Cholesterol (Calc)  Date Value Ref Range Status  11/23/2021 47 mg/dL (calc) Final    Comment:    Reference range: <100 . Desirable range <100 mg/dL for primary prevention;   <70 mg/dL for patients with CHD or diabetic patients  with > or = 2 CHD risk  factors. Marland Kitchen LDL-C is now calculated using the Martin-Hopkins  calculation, which is a validated novel method providing  better accuracy than the Friedewald equation in the  estimation of LDL-C.  Horald Pollen et al. Lenox Ahr. 9528;413(24): 2061-2068  (http://education.[facility].com/faq/FAQ164)    LDL Cholesterol  Date Value Ref Range Status  06/03/2022 36 0 - 99 mg/dL Final    Comment:           Total Cholesterol/HDL:CHD Risk Coronary Heart Disease Risk Table                     Men   Women  1/2 Average Risk   3.4   3.3  Average Risk       5.0   4.4  2 X Average Risk   9.6   7.1  3 X Average Risk  23.4   11.0        Use the calculated Patient Ratio above and the CHD Risk Table to determine the patient's CHD Risk.        ATP III  CLASSIFICATION (LDL):  <100     mg/dL   Optimal  098-119  mg/dL   Near or Above                    Optimal  130-159  mg/dL   Borderline  147-829  mg/dL   High  >562     mg/dL   Very High Performed at [facility], [address] [place], [place] [address]     Rande Brunt. Lenell Antu, MD [facility] of [facility] Phone Number: [phone] 04/20/2023 6:17 PM   Total time spent on preparing this encounter including chart review, data review, collecting history, examining the patient, coordinating care for this established patient, 30 minutes.  Portions of this report may have been transcribed using voice recognition software.  Every effort has been made to ensure accuracy; however, inadvertent computerized transcription errors may still be present.

## 2023-04-20 NOTE — Progress Notes (Signed)
Occupational Therapy Weekly Progress Note  Patient Details  Name: [name] [name] MRN: [id number] Date of Birth: Aug 28, 1950  Beginning of progress report period: March 13, 2023 End of progress report period: March 21, 2023  Patient continues to make minimal progress and waxes and wanes with his participation, alertness, and behaviors. Patient needed max/total A for shower this week but had received medications to reduce behaviors that made him very lethargic and difficulty participating functionally within BADL tasks. Goals downgraded this week to overall mod A.   Patient continues to demonstrate the following deficits: muscle weakness, decreased cardiorespiratoy endurance, decreased initiation, decreased attention, decreased awareness, decreased problem solving, decreased safety awareness, decreased memory, and delayed processing, and decreased sitting balance, decreased standing balance, decreased postural control, hemiplegia, and decreased balance strategies and therefore will continue to benefit from skilled OT intervention to enhance overall performance with Reduce care partner burden.  Patient not progressing toward long term goals.  See goal revision..  Plan of care revisions: Goals downgraded to mod A.  OT Short Term Goals Week 5:  OT Short Term Goal 1 (Week 5): Working to consistently meet LTG Week 6:  OT Short Term Goal 1 (Week 6): LTG=STG 2/2 ELOS   Therapy/Group: Individual Therapy  [name] 04/20/2023, 5:45 PM

## 2023-04-20 NOTE — Progress Notes (Addendum)
PROGRESS NOTE   Subjective/Complaints  Reviewed documentation from dialysis yesterday, remained agitated and pulling at lines throughout session but was able to complete without use of as needed Ativan.  Per nursing, he remained agitated until approximately 9 PM last night, then slept well from 11 PM to 5 AM this morning.  On exam, patient states he is doing well, no complaints.  Alert and pleasant sitting at nurses station, remains lethargic and moderately encephalopathic.  Endorses no needs at this time.  Wife called and updated on patient's status, no current questions or concerns.  ROS: As per HPI above, limited by cognitive deficits  Objective:   DG CHEST PORT 1 VIEW  Result Date: 04/19/2023 CLINICAL DATA:  Cough. EXAM: PORTABLE CHEST 1 VIEW COMPARISON:  Chest x-ray dated February 28, 2023. FINDINGS: Poor evaluation of the heart and mediastinal contours due to patient positioning and rotation. The cardiomediastinal silhouette is grossly stable status post CABG. Low lung volumes with mild left basilar atelectasis, right basilar consolidation, and small right pleural effusion. No definite pneumothorax. No acute osseous abnormality. IMPRESSION: 1. Low lung volumes. Small right pleural effusion with adjacent right lower lobe atelectasis or infiltrate. Electronically Signed   By: Obie Dredge M.D.   On: 04/19/2023 15:35   Recent Labs    04/18/23 1001  WBC 4.3  HGB 10.1*  HCT 33.6*  PLT 220       Recent Labs    04/18/23 1001  NA 134*  K 3.9  CL 94*  CO2 25  GLUCOSE 118*  BUN 36*  CREATININE 9.50*  CALCIUM 9.2        Intake/Output Summary (Last 24 hours) at 04/20/2023 1242 Last data filed at 04/20/2023 0815 Gross per 24 hour  Intake 437.95 ml  Output 2000 ml  Net -1562.05 ml           Physical Exam: Vital Signs Blood pressure (!) 155/99, pulse 98, temperature (!) 97.5 F (36.4 C), resp. rate 16, height  5\' 4"  (1.626 m), weight 90.8 kg, SpO2 95%.   General: No acute distress.  Patient sitting upright in wheelchair at nurses station. Mood and affect are mildly flat, overall appropriate.  Pleasant today. Heart: Regular rate and rhythm no rubs murmurs or extra sounds Lungs: Clear to auscultation, breathing unlabored, no rales or wheezes. No apparent respiratory distress.  Cough resolved. Abdomen: Positive bowel sounds, soft nontender to palpation, nondistended Extremities: No clubbing, cyanosis, or edema Skin: No evidence of breakdown, no evidence of rash.  Neurologic: AAO to self, consistently states this is [facility] despite correction and options, is oriented to month and year.  Stable from prior exams.  + Cognitive deficits, difficulty with recent memory, difficulty with concentration.  Better with distant memory.   + emotional lability, intermittent lethargy and then agitation.  cranial nerves II through XII intact, motor strength is moving all 4 extremities 5- out of 5 throughout  Response to light touch in all 4 extremities No resting tremor appreciated today; intermittent upper extremity resting tremor Musculoskeletal: No obvious deformities.  No pain with range of motion in all 4 extremities. No joint swelling.       Assessment/Plan: 1. Functional  deficits which require 15 hr per week of interdisciplinary therapy in a comprehensive inpatient rehab setting. Physiatrist is providing close team supervision and 24 hour management of active medical problems listed below. Physiatrist and rehab team continue to assess barriers to discharge/monitor patient progress toward functional and medical goals  Care Tool:  Bathing    Body parts bathed by patient: Right arm, Left arm, Chest, Abdomen, Front perineal area, Right upper leg, Buttocks, Left upper leg, Right lower leg, Left lower leg, Face   Body parts bathed by helper: Right arm, Left arm, Chest, Abdomen, Front perineal area,  Buttocks, Right upper leg, Left upper leg, Right lower leg, Left lower leg, Face     Bathing assist Assist Level: Total Assistance - Patient < 25%     Upper Body Dressing/Undressing Upper body dressing   What is the patient wearing?: Pull over shirt    Upper body assist Assist Level: Total Assistance - Patient < 25%    Lower Body Dressing/Undressing      Lower body dressing      What is the patient wearing?: Underwear/pull up, Pants     Lower body assist Assist for lower body dressing: Total Assistance - Patient < 25%     Toileting Toileting    Toileting assist Assist for toileting: Maximal Assistance - Patient 25 - 49%     Transfers Chair/bed transfer  Transfers assist     Chair/bed transfer assist level: Moderate Assistance - Patient 50 - 74%     Locomotion Ambulation   Ambulation assist      Assist level: Minimal Assistance - Patient > 75% Assistive device: No Device Max distance: 175'   Walk 10 feet activity   Assist     Assist level: Contact Guard/Touching assist Assistive device: No Device   Walk 50 feet activity   Assist Walk 50 feet with 2 turns activity did not occur: Safety/medical concerns  Assist level: Minimal Assistance - Patient > 75% Assistive device: No Device    Walk 150 feet activity   Assist Walk 150 feet activity did not occur: Safety/medical concerns  Assist level: Minimal Assistance - Patient > 75% Assistive device: No Device    Walk 10 feet on uneven surface  activity   Assist Walk 10 feet on uneven surfaces activity did not occur: Safety/medical concerns         Wheelchair     Assist Is the patient using a wheelchair?: Yes Type of Wheelchair: Manual    Wheelchair assist level: Dependent - Patient 0%      Wheelchair 50 feet with 2 turns activity    Assist        Assist Level: Dependent - Patient 0%   Wheelchair 150 feet activity     Assist      Assist Level: Dependent -  Patient 0%   Blood pressure (!) 155/99, pulse 98, temperature (!) 97.5 F (36.4 C), resp. rate 16, height 5\' 4"  (1.626 m), weight 90.8 kg, SpO2 95%.  Medical Problem List and Plan: 1. Functional deficits secondary to metabolic encephalopathy due to sepsis secondary to catheter peritonitis and hypoxemia              -patient may not shower             -ELOS/Goals: 16-18 days, PT/OT Sup, SLP min A -tentative discharge date 9-9           -Continue CIR therapies including PT, OT, and SLP   -[facility] -- encephalopathy/aggitation has developed since approximately 8/22, with decline earlier following COVID infection per wife   -Therapy QD schedule  - Con't CIR PT, OT and SLP  - Patient was seen by palliative care, changed to DNR status -Marcelino Duster with palliative continuing to follow  -Tried wife at bedside with dialysis on 10-3, patient remained agitated and pulling at lines, per nephrology not a candidate for outpatient hemodialysis.    - 10/4: After discussion with medical, admissions, and social work team, current options appear to be transitioning patient back to PD versus placement at out-of-state SNF with HD capabilities versus converting patient to hospice.  Nephrology discussed this with the wife, she is opting for vascular surgery consultation and attempt to revise back to PD to allow the patient to return home without hospice care.   2.  Antithrombotics: -DVT/anticoagulation:  Pharmaceutical: Heparin, 03/23/23 changed to Lovenox -  consistent refusal -  started on Eliquis 2.5 mg BID - Dced 9/12 per cardiology "With patient's chronic anemia and agitation/recent falls, appears that risk of OAC>benefits at this time... ". SCDs at bedtime added 10/4.              -antiplatelet therapy: aspirin and Plavix --> ASA alone per cardiology 9/11   3. Pain Management:              -Lidoderm patches to back, Tylenol every 4 hours as needed's, muscle rub as needed, and oxycodone 2.5 mg  3 times daily as needed  -Elavil 25 mg at bedtime for nerve pain and sleep 8/27 -weaned off 9-7 due to oversedation  - No use of PRNs or general complaints of pain since 9-29  4.  Lethargy/ intermittent agitation /insomnia: Persistent despite interventions  -Weaned off of veil bed, remains on telesitter for safety.  Appears somewhat better with wife at bedside and when seated at the station.             -Admitted on Seroquel 50 mg TID -weaned off             -clonazepam 0.5 mg TID prn -transition to lorazepam 0.5 mg daily p.o. or IV as needed for anxiety, agitation -10-4 psychiatry premedicating dialysis with 0.5 mg ativan -Currently have Atarax first-line and Ativan 0.5 mg second line for PRNs for agitation; redirect and avoid overstimulation as much as possible. increasing sedating medications is much as possible, as there is a direct effect on patient's sleep, confusion, and aggitation up to a day after receiving PRN antipsychotics and Ativan.              - Dced Geodon 10 mg IM q 12 hours prn d/t  switched to Abilify per psychiatry 9-17             -8-28 added depakote 125 mg q8h for ongoing aggitation, mood stabilization -discontinued 9-2, resumed 9-21, increased 9-23 - level 18 subtherapeutic increased to 250 mg Q12h 10/4  - 9-17 Abilify started per psychiatry, increased to 7.5 mg 9-27  - 9-24 memantine 5 mg daily started per psychiatry, discontinued 10-3 per family request due to increasing confusion and lethargy -Psychiatry following, appreciate their assistance and recommendations in managing this patient  5. Hypoactive delirium/metabolic encephalopathy: This patient is not capable of making decisions on his own behalf.  Persistent despite significant efforts to workup/treat.  -  8/29: EEG negative, 8-30: MRI showing chronic microvascular changes, otherwise normal.  Multiple lab workups for encephalopathy with CMP, ammonia, CBC negative except for low VPA.   -  10-2: Lethargic, jerking ,  BG 70s, improved with POs.  Resume blood glucose check twice daily. Repeat VPA levels (18).  CMP CBC look okay.    -    6. Skin/Wound Care: Routine skin care checks             -sacral skin off-loading/protection   7. Fluids/Electrolytes/Nutrition: Strict Is and Os and follow-up chemistries per nephrology             - renal diet/ 1200 cc FR  - Renvela 800 mg 3 times daily for hyperphosphatemia added per nephrology 8-27   -Management per nephrology, labs appear approximately stable, p.o. intakes are appropriate at this time but wax and wane with lethargy    8: Hypotension/Hypertension: monitor TID and prn             -continue midodrine as needed during dialysis  9-1: add midodrine 5 mg tid and monitor.  DC Coreg as heart rate has been low to normal; may resume at low-dose once blood pressure improved.  9/13 DC midodrine   - Intermittent hypertension into the 150s, is higher on off dialysis days.  Would maintain higher perfusion pressure given microvascular changes seen on MRI.  No medication adjustments for now.     04/20/2023    5:07 AM 04/20/2023    5:00 AM 04/19/2023    8:15 PM  Vitals with BMI  Weight  --   Systolic 155  125  Diastolic 99  89  Pulse 98  73     9: Hyperlipidemia: continue statin   10: CAD s/p CABG 2023: on DAPT and statin.  - troponin elevated but stable, monitor 8/27   11: DM-2: A1c = 6.3% (at home on Lantus 22 units at bedtime, Tradjenta 5 mg daily)        -monitor PO intake             -continue SSI (0-6 units) does not appear to be requiring coverage   - 9/6 DC SSI, change CBGs to daily   - 10-2: Hypoglycemic today, 70s, improved with p.o. intakes, 88 at recheck.  Likely due to poor p.o. intakes last few days.  Start CBG checks twice daily  - 10-4: Patient agitated with CBG checks, refusing.  Moved to as needed for lethargy, AMS Recent Labs    04/18/23 0909 04/18/23 0937 04/19/23 1117  GLUCAP 73 88 141*     12: ESRD: HD on Tues, Thurs, Sat  -nephrology made aware  -Regularly have to hold dialysis or stopped early due to agitated behaviors, which is ongoing.  Recently has been able to receive all or part of most dialysis sessions.  -Have tried premedicating patient and providing as needed medications with Haldol, Ativan as available on dialysis unit; measures helped temporarily but overall do not seem to affect treatment  -Wife at bedside for dialysis 10-3, did not improve behaviors, per nephrology patient is not a candidate for outpatient dialysis.  Current best option may be returning to PD; discussing with wife 10-4   13: COPD/OSA; chronic home 2L O2 (Symbicort, Ventolin, prednisone 40 mg daily at home)             -continue symbicort  -Added home oxygen back 8-27  -Satting well on room air, oxygen not required   14: GERD: continue Protonix   15: Non-sustained VT/pAF/bradycardia: per cardiology, Dr. Algie Coffer: -continue carvedilol 6.25 mg BID; consider increasing to 12.5 mg BID before starting low dose amiodarone  -consider Eliquis and discontinue  asa and Plavix in one week if no active GI bleeding and stable hemoglobin from 8/24 -hemoglobin has remained stable but very low around 7, no recurrent A-fib, monitor for now given agitation/fall 8-31 and consider initiating Eliquis when safe -Some intermittent recurrent bradycardia while at inpatient rehab, improved with discontinuation of Elavil and Geodon -9/11 EKG for bradycardia episode today, 2nd degree A-V block mobitz type 1.  Continue to hold beta blocker,9/11 will restart eliquis at 2.5 mg BID dose. Will consult cardiology regarding need for ASA/plavix and coreg.  Dr. Elease Hashimoto does not appear convinced that he has A-fib 9/12 per cardiology, DAPT transition to daily aspirin 81 only and Eliquis was discontinued - Heart rate currently well-controlled, hypertension as above, continuing to hold Eliquis due to falls/poor safety awareness, starting SCDs as above   16: HFrEF: Daily  weights. Monitor for signs of overload  -Weights have remained overall stable/downtrending with hemodialysis since admission Filed Weights   04/17/23 1355 04/17/23 1755  Weight: 90.6 kg 90.8 kg      17: Liver nodularity on CT scan: LFTs reported to be normal. Avoid hepatotoxic medications    -Abdominal ultrasound earlier this admission with minimal ascites  -Ammonia levels have remained normal on multiple checks   18: Epistaxis - resolved             -previously improved with flonase and saline spray   19: Morbid Obesity. Dietary counseling Body mass index is 34.36 kg/m.    20. Hematuria - resolved  9/22- required in/out to check U/A- having new hematuria- nursing to keep an eye- since also bleeding from penis a little- monitor   9/23 no wBCs  on UA, follow  urine culture-Staphylococcus hemolyticus-discussed with pharmacy and ID pharmacy.  Think this is more likely contaminant  9/25 discuss with nephrology about possible treatment or other evaluation options  Spoke with Dr. Ronalee Belts, most likely contaminant.  Recommended treating only if symptomatic.  If hematuria continues consider urology input  9/27 urine has improved to clear yellow   21.  Vitamin C deficiency.  Aggressive repletion per psychiatry, 100 mg IV x 6+ 500 mg p.o. daily.  Feel this may help his cognition.   23.  Chest pain a.m. 10-3.  EKG unchanged, self resolved.   LOS: 39 days A FACE TO FACE EVALUATION WAS PERFORMED  Angelina Sheriff 04/20/2023, 12:42 PM

## 2023-04-20 NOTE — Progress Notes (Signed)
Speech Language Pathology Daily Session Note  Patient Details  Name: [name] [name] MRN: [id number] Date of Birth: 1951-03-29  Today's Date: 04/20/2023 SLP Individual Time: 1031-1057 SLP Individual Time Calculation (min): 26 min  Short Term Goals: Week 6: SLP Short Term Goal 1 (Week 6): Patient will utilize external aids to orient to time and situation given modA SLP Short Term Goal 2 (Week 6): Pt will complete functional problem solving with 60% acc with max A. SLP Short Term Goal 3 (Week 6): Pt will attend to functional task for 5-7 minutes given mod multimodal cues. SLP Short Term Goal 4 (Week 6): Pt will answer biographical questions via multimodal communication accurately with 75% acc with mod A  Skilled Therapeutic Interventions: SLP conducted skilled therapy session targeting cognitive retraining goals. Patient received at nurses station, taken to room to facilitate less distracting environment. SLP and patient participated in simple rule card game utilizing functional follow directions still and identifying higher vs. Lower cards. Patient attended to task given mod-maxA for 20 minutes. Patient benefited from maxA to follow 1-step directions and identify winning cards each round (highest card). Patient reported he was "tired" and done with task after ~20 minutes of participation and then returned to nursing station with chair alarm in place. SLP will continue with plan of care as indicated.      Pain Pain Assessment Pain Scale: 0-10 Pain Score: 0-No pain  Therapy/Group: Individual Therapy  [name], M.A., CCC-SLP  [name] 04/20/2023, 12:39 PM

## 2023-04-20 NOTE — Progress Notes (Signed)
Occupational Therapy Session Note  Patient Details  Name: [name] [name] MRN: [id number] Date of Birth: 09/29/1950  Today's Date: 04/20/2023 OT Individual Time: 1132-1200 OT Individual Time Calculation (min): 28 min     Skilled Therapeutic Interventions/Progress Updates: Patient received sitting up at nurses station. No complaints of pain but reported feeling tired. Patient taken to therapy gym for UE coordination and functional reaching activities. Patient needing maximal verbal cues to perform active reach task and encouragement to stay awake. Patient continued to have difficulty keeping eyes open even with hand over hand to initiate reaching task so therapist concluded the therapy session with in room mobility and transfer back to bed. Patient able to stand with only minimal assist, but needed max cuing to take steps for transfer from w/c to bed. Patient needing max assist with sitting EOB to transitioning to supine due to poor motor planning. Continue with skilled treatment plan to improve transfer safety.      Therapy Documentation Precautions:  Precautions Precautions: Fall Restrictions Weight Bearing Restrictions: No General:   Vital Signs: Therapy Vitals Resp: 16 Oxygen Therapy SpO2: 95 % O2 Device: Room Air Pain:   ADL: ADL Eating: Minimal assistance Grooming: Moderate assistance Upper Body Bathing: Dependent Lower Body Bathing: Dependent Upper Body Dressing: Dependent Lower Body Dressing: Dependent Toileting: Maximal assistance Toilet Transfer Method: Stand pivot Tub/Shower Transfer: Moderate assistance    Therapy/Group: Individual Therapy  Warnell Forester 04/20/2023, 12:14 PM

## 2023-04-20 NOTE — Progress Notes (Addendum)
[facility] Progress Note   Subjective:   HD was completed at bedside yesterday with wife present. Per nursing notes, still did not go well, patient attempted to pull at needles and get out of bed. Pt seated in wheelchair at nurses station, alert and calm. Does not remember HD yesterday but says he does not like dialysis in general. Denies SOB, CP, dizziness, nausea.  Objective Vitals:   04/19/23 2015 04/19/23 2200 04/20/23 0507 04/20/23 0805  BP: 125/89  (!) 155/99   Pulse: 73  98   Resp: 16  20 16   Temp: 97.6 F (36.4 C)  (!) 97.5 F (36.4 C)   TempSrc: Oral     SpO2: 91% 96% 98% 95%  Weight:      Height:       Physical Exam General: Alert male in NAD Heart: RRR, no murmurs, rubs or gallops Lungs: CTA bilaterally, respirations unlabored Abdomen: Soft, non-distended, +BS Extremities: No edema b/l lower extremities Dialysis Access:  LUE AVF + thrill/bruit  Additional Objective Labs: Basic Metabolic Panel: Recent Labs  Lab 04/14/23 1152 04/18/23 1001  NA 133* 134*  K 4.7 3.9  CL 94* 94*  CO2 23 25  GLUCOSE 154* 118*  BUN 50* 36*  CREATININE 12.62* 9.50*  CALCIUM 9.3 9.2  PHOS 4.6  --    Liver Function Tests: Recent Labs  Lab 04/14/23 1152 04/18/23 1001  AST  --  30  ALT  --  21  ALKPHOS  --  78  BILITOT  --  0.8  PROT  --  6.9  ALBUMIN 2.6* 2.5*   No results for input(s): "LIPASE", "AMYLASE" in the last 168 hours. CBC: Recent Labs  Lab 04/14/23 1152 04/18/23 1001  WBC 4.3 4.3  NEUTROABS  --  3.0  HGB 10.3* 10.1*  HCT 35.1* 33.6*  MCV 90.9 89.6  PLT 242 220   Blood Culture    Component Value Date/Time   SDES URINE, CATHETERIZED 04/08/2023 0915   SPECREQUEST  04/08/2023 0915    NONE Performed at [facility], [address], [place], [place] [address]    CULT >=100,000 COLONIES/mL STAPHYLOCOCCUS HAEMOLYTICUS (A) 04/08/2023 0915   REPTSTATUS 04/10/2023 FINAL 04/08/2023 0915    Cardiac Enzymes: No results for  input(s): "CKTOTAL", "CKMB", "CKMBINDEX", "TROPONINI" in the last 168 hours. CBG: Recent Labs  Lab 04/17/23 0251 04/18/23 0909 04/18/23 0937 04/19/23 1117  GLUCAP 104* 73 88 141*   Iron Studies: No results for input(s): "IRON", "TIBC", "TRANSFERRIN", "FERRITIN" in the last 72 hours. @lablastinr3 @ Studies/Results: DG CHEST PORT 1 VIEW  Result Date: 04/19/2023 CLINICAL DATA:  Cough. EXAM: PORTABLE CHEST 1 VIEW COMPARISON:  Chest x-ray dated February 28, 2023. FINDINGS: Poor evaluation of the heart and mediastinal contours due to patient positioning and rotation. The cardiomediastinal silhouette is grossly stable status post CABG. Low lung volumes with mild left basilar atelectasis, right basilar consolidation, and small right pleural effusion. No definite pneumothorax. No acute osseous abnormality. IMPRESSION: 1. Low lung volumes. Small right pleural effusion with adjacent right lower lobe atelectasis or infiltrate. Electronically Signed   By: Obie Dredge M.D.   On: 04/19/2023 15:35   Medications:  sodium chloride 10 mL/hr at 04/18/23 1515   dextrose 5 % 100 mL with ascorbic acid (VITAMIN C) 200 mg infusion Stopped (04/19/23 1956)    (feeding supplement) PROSource Plus  30 mL Oral BID BM   ARIPiprazole  7.5 mg Oral QHS   [START ON 05/02/2023] ascorbic acid  500  mg Oral Daily   aspirin EC  81 mg Oral Daily   atorvastatin  80 mg Oral Daily   Chlorhexidine Gluconate Cloth  6 each Topical Q0600   Chlorhexidine Gluconate Cloth  6 each Topical Q0600   darbepoetin (ARANESP) injection - DIALYSIS  150 mcg Subcutaneous Q Sun-1800   divalproex  125 mg Oral Q8H   feeding supplement  237 mL Oral BID WC   Gerhardt's butt cream  1 Application Topical QID   lidocaine  1 patch Transdermal Q2200   lidocaine  1 patch Transdermal Q24H   mometasone-formoterol  2 puff Inhalation BID   pantoprazole  40 mg Oral BID   traZODone  50 mg Oral QHS    Dialysis Orders: Previously on [facility] thru [facility]  [place] - changed to HD - will need outpatient HD unit established prior to d/c. 4hr, 3K, EDW ~96, L AVF   Assessment/Plan: 1. Debility - in CIR 2. Encephalopathy/agitation: Waxing/waning agitation, dementia, sundowning. No acute findings on imaging. He has been disruptive during HD.  Did not improve with wife at bedside. Patient requires one on one HD for his safety, which is not possible in outpatient setting.  Use of IV Ativan can not be done in OP HD units. IR will not place catheter, agitation is not an indication to place cathter.  Meds being adjusted per primary team/psychiatry team.  3. ESRD: Transitioned from PD to HD recently on TTS schedule. Using old AVF which is still functional. Family would prefer TTS HD. Coordinate with rehab team so they know to premedicate before HD, being done at bedside due to his disruptive behavior 4.Hypertension/volume: BP in goal. Midodrine 5mg  stopped. UF as tolerated.  5. Anemia: Hgb 10.1-on Aranesp weekly. Last Tsat 38% with ferritin 2175.  No iron.  6. Metabolic bone disease: CorrCa 57.8 , Phos acceptable. No VDRA. 7.  Nutrition:  Alb 2.5, continue supps.  Renal diet w/fluid restrictions.  8.  Hx NSVT: On Coreg 10.  Recent culture negative PD cath-related peritonitis and associated fungemia + enterococcus bacteremia - underwent PD cath removal, TDC placement and removal, temp HD cath placement  and removal, and line holiday. Completed IV unasyn, IV micafungin and cipro on 8/15. TDC not replaced cause he had functional AVF.   11. Hematuria - dirty UA. Coag neg staph on Ucx - defer to primary team  12. GOC - Seen by palliative care - Now DNR/DNI wants to continue dialysis. See above. 13. Dispo: For SNF placement. Regarding outpatient dialysis placement- per renal navigator he will need new referral since he has been gone from his prior clinic so long. Family members all willing to sit with him during treatments.  At this time he is not appropriate  for outpatient dialysis d/t safety concerns with ongoing agitation and requiring restraints and multiple staff to reorient during treatments, even with wife at bedside  Addendum: Spoke with primary team and social worker regarding options for future dialysis. LTAC unlikely to work due to insurance issues, outpatient HD not an option with his current agitation. I do not feel he is safe for in home HD either since he tried to pull his HD lines. His PD cath was previously removed because it was non-functional during an episode of peritonitis. I spoke with his wife, [name]. She strongly prefers to try PD again and believes she could successfully run his PD at home. She does NOT want patient to to go hospice. She understands there is a risk that his  abdomen may no longer be suitable for PD, and there is a risk he will pull his PD catheter/lines, risk of recurrent infection and bleeding. I discussed with Dr. Glenna Fellows who was the nephrologist on board when patient was switched to HD, she agrees we could try PD again if he does not have too many adhesions since it is really his only option at this point. Will consult vascular surgery to see if they are able to evaluate him for a new PD catheter.   Rogers Blocker, PA-C 04/20/2023, 8:38 AM  [facility] Pager: [phone]

## 2023-04-20 NOTE — Progress Notes (Signed)
Discussed with patient at the start of the evening that the Provider wanted him to wear SCD's. Explained reason and importance for wearing them but continued to refuse. On call Provider contacted to verify if giving patient 2000 and 2200 medications would be okay if given closer/few minutes prior to 2000. Due to patient being in a stable mood with initial interaction at 1930 reason wanting to attempt to give medication due to night prior refusing medications several times before agreeing to take medication. Was in agreement to take medication. Assisted him back to his room. Vitals obtained. With transferring to bed from wheelchair experienced weakness with lower extremities. Denied headache or lightheadedness. Attempted twice to assist in stand pivoting patient from wheelchair to the bed. Unable to transfer and second staff contacted to assist with transferring him.  With gait tremulous in all 4 extremities. Difficulty turning his lower torso area and demonstrating rigidity with his legs. Able to reposition him in the bed. However, continued to be restless throughout the evening. Merchandiser, retail with patient to aide in calming him down till able to give him PRN medication.  PRN medication given for anxiety. Did take scheduled medication at that time as well. Was beginning to demonstrate improvement and be less restless/more compliant with safety precautions. However, actions continued attempting to exit the bed and place legs over the rails similar to earlier in the evening. Continues to be redirectable. Continues to be tremulous. Unable to obtain CBG due to him refusing and slight irritability by patient. As well as demonstration paranoia with Clinical research associate.  Explained to patient would sit and talk with him this evening. Few events of restlessness but able to be redirected. Asking for something to drink and helped in keeping patient calm/relax. Music put on for patient till able to come back and talk to him this  evening. Sat talked to patient, turned lights off, and stayed with patient till able to be less restless. Is awake at this time. Responding to self and responding to stimuli at this time.

## 2023-04-20 NOTE — Progress Notes (Signed)
Patient continues to refuse testing of blood sugars with profanity and physical retraction of UE.  PA/MD made aware

## 2023-04-20 NOTE — Plan of Care (Signed)
  Problem: RH Stairs Goal: LTG Patient will ambulate up and down stairs w/assist (PT) Description: LTG: Patient will ambulate up and down # of stairs with assistance (PT) Outcome: Not Met (add Reason) Note: Goal discharged due to lack of progress   Problem: RH Ambulation Goal: LTG Patient will ambulate in home environment (PT) Description: LTG: Patient will ambulate in home environment, # of feet with assistance (PT). Flowsheets (Taken 04/20/2023 1624) LTG: Ambulation distance in home environment: 25 Note: Goal downgraded to reflect pt current progress   Problem: RH Wheelchair Mobility Goal: LTG Patient will propel w/c in controlled environment (PT) Description: LTG: Patient will propel wheelchair in controlled environment, # of feet with assist (PT) Flowsheets (Taken 04/20/2023 1624) LTG: Propel w/c distance in controlled environment: 25 Note: Goal downgraded due to lack of progress and inconsistency with presentation   Problem: RH Pre-functional/Other (Specify) Goal: RH LTG PT (Specify) 1 Description:  RH LTG PT (Specify) 1 Flowsheets (Taken 04/20/2023 1624) LTG: Other PT (Specify) 1: Pt caregiver will be independent to assist with functional transfers bed<>wc Goal: RH LTG PT (Specify) 2 Description: RH LTG PT (Specify) 2 Flowsheets (Taken 04/20/2023 1624) LTG: Other PT (Specify) 2: Pt caregiver will be independent to assist pt with bed mobility

## 2023-04-21 DIAGNOSIS — F05 Delirium due to known physiological condition: Secondary | ICD-10-CM

## 2023-04-21 LAB — GLUCOSE, CAPILLARY: Glucose-Capillary: 128 mg/dL — ABNORMAL HIGH (ref 70–99)

## 2023-04-21 MED ORDER — LIDOCAINE-PRILOCAINE 2.5-2.5 % EX CREA
TOPICAL_CREAM | Freq: Once | CUTANEOUS | Status: DC
  Filled 2023-04-21 (×2): qty 5
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
## 2023-04-21 NOTE — Progress Notes (Signed)
[facility] Progress Note   Subjective:   Vascular surgery evaluated patient yesterday, did not feel he was a good candidate for PD catheter placement. Today, patient is sitting in bed calmly but asks me why he is "locked up," explained where he is and why. He denies SOB, CP, dizziness, nausea.    Objective Vitals:   04/20/23 1555 04/20/23 1959 04/20/23 2138 04/21/23 0640  BP: 121/78 (!) 160/90 (!) 158/71 (!) 130/102  Pulse: 90 (!) 54 100 91  Resp: 16 20  20   Temp: 97.7 F (36.5 C) 97.6 F (36.4 C)  (!) 97.5 F (36.4 C)  TempSrc: Oral Oral  Axillary  SpO2: 97% 92% 100% 100%  Weight:      Height:       Physical Exam General: Alert male in NAD Heart: RRR, no murmurs, rubs or gallops Lungs: CTA bilaterally, no wheezing, rhonchi or rales Abdomen: Soft, non-distended, +BS Extremities: No edema b/l lower extremities Dialysis Access:  LUE AVF + t/b  Additional Objective Labs: Basic Metabolic Panel: Recent Labs  Lab 04/14/23 1152 04/18/23 1001  NA 133* 134*  K 4.7 3.9  CL 94* 94*  CO2 23 25  GLUCOSE 154* 118*  BUN 50* 36*  CREATININE 12.62* 9.50*  CALCIUM 9.3 9.2  PHOS 4.6  --    Liver Function Tests: Recent Labs  Lab 04/14/23 1152 04/18/23 1001  AST  --  30  ALT  --  21  ALKPHOS  --  78  BILITOT  --  0.8  PROT  --  6.9  ALBUMIN 2.6* 2.5*   No results for input(s): "LIPASE", "AMYLASE" in the last 168 hours. CBC: Recent Labs  Lab 04/14/23 1152 04/18/23 1001  WBC 4.3 4.3  NEUTROABS  --  3.0  HGB 10.3* 10.1*  HCT 35.1* 33.6*  MCV 90.9 89.6  PLT 242 220   Blood Culture    Component Value Date/Time   SDES URINE, CATHETERIZED 04/08/2023 0915   SPECREQUEST  04/08/2023 0915    NONE Performed at [facility], [address] [place], [place] [address]    CULT >=100,000 COLONIES/mL STAPHYLOCOCCUS HAEMOLYTICUS (A) 04/08/2023 0915   REPTSTATUS 04/10/2023 FINAL 04/08/2023 0915    Cardiac Enzymes: No results for input(s): "CKTOTAL",  "CKMB", "CKMBINDEX", "TROPONINI" in the last 168 hours. CBG: Recent Labs  Lab 04/17/23 0251 04/18/23 0909 04/18/23 0937 04/19/23 1117 04/21/23 0038  GLUCAP 104* 73 88 141* 128*   Iron Studies: No results for input(s): "IRON", "TIBC", "TRANSFERRIN", "FERRITIN" in the last 72 hours. @lablastinr3 @ Studies/Results: DG CHEST PORT 1 VIEW  Result Date: 04/19/2023 CLINICAL DATA:  Cough. EXAM: PORTABLE CHEST 1 VIEW COMPARISON:  Chest x-ray dated February 28, 2023. FINDINGS: Poor evaluation of the heart and mediastinal contours due to patient positioning and rotation. The cardiomediastinal silhouette is grossly stable status post CABG. Low lung volumes with mild left basilar atelectasis, right basilar consolidation, and small right pleural effusion. No definite pneumothorax. No acute osseous abnormality. IMPRESSION: 1. Low lung volumes. Small right pleural effusion with adjacent right lower lobe atelectasis or infiltrate. Electronically Signed   By: Obie Dredge M.D.   On: 04/19/2023 15:35   Medications:  sodium chloride 10 mL/hr at 04/18/23 1515   dextrose 5 % 100 mL with ascorbic acid (VITAMIN C) 200 mg infusion Stopped (04/19/23 1956)    (feeding supplement) PROSource Plus  30 mL Oral BID BM   ARIPiprazole  7.5 mg Oral QHS   [START ON 05/02/2023] ascorbic acid  500 mg Oral Daily   aspirin EC  81 mg Oral Daily   atorvastatin  80 mg Oral Daily   Chlorhexidine Gluconate Cloth  6 each Topical Q0600   darbepoetin (ARANESP) injection - DIALYSIS  150 mcg Subcutaneous Q Sun-1800   divalproex  250 mg Oral QHS   feeding supplement  237 mL Oral BID WC   Gerhardt's butt cream  1 Application Topical QID   lidocaine  1 patch Transdermal Q2200   lidocaine  1 patch Transdermal Q24H   LORazepam  1 mg Oral Q T,Th,Sa-HD   mometasone-formoterol  2 puff Inhalation BID   pantoprazole  40 mg Oral BID   traZODone  50 mg Oral QHS    Dialysis Orders: Previously on CCPD thru [facility] - changed to HD  - will need outpatient HD unit established prior to d/c. 4hr, 3K, EDW ~96, L AVF   Assessment/Plan: 1. Debility - in CIR 2. Encephalopathy/agitation: Waxing/waning agitation, dementia, sundowning. No acute findings on imaging. He has been disruptive during HD.  Did not improve with wife at bedside. Patient requires one on one HD for his safety, which is not possible in outpatient setting.  Use of IV Ativan can not be done in OP HD units. IR will not place catheter, agitation is not an indication to place catheter.  Considered switching to PD so patient can complete dilalysis at home, but vascular surgery felt he was high risk for PD catheter placement. He is not a candidate for LTAC per SW. Unfortunately our options are limited to essentially hospice or waiting to see if mental status improves. Meds being adjusted per primary team/psychiatry team.  3. ESRD: Transitioned from PD to HD recently on TTS schedule. Using old AVF which is still functional. Family would prefer TTS HD. Coordinate with rehab team so they know to premedicate before HD, being done at bedside due to his disruptive behavior 4.Hypertension/volume: BP in goal. Midodrine 5mg  stopped. UF as tolerated.  5. Anemia: Hgb 10.1-on Aranesp weekly. Last Tsat 38% with ferritin 2175.  No iron.  6. Metabolic bone disease: CorrCa 02.7 , Phos acceptable. No VDRA. 7.  Nutrition:  Alb 2.5, continue supps.  Renal diet w/fluid restrictions.  8.  Hx NSVT: On Coreg 10.  Recent culture negative PD cath-related peritonitis and associated fungemia + enterococcus bacteremia - underwent PD cath removal, TDC placement and removal, temp HD cath placement  and removal, and line holiday. Completed IV unasyn, IV micafungin and cipro on 8/15. TDC not replaced cause he had functional AVF.   11. Dispo: For SNF placement. Regarding outpatient dialysis placement- per renal navigator he will need new referral since he has been gone from his prior clinic so long.  Family members all willing to sit with him during treatments.  At this time he is not appropriate for outpatient dialysis d/t safety concerns with ongoing agitation and requiring restraints and multiple staff to reorient during treatments, even with wife at bedside. See above.     Rogers Blocker, PA-C 04/21/2023, 8:29 AM  [facility] Pager: [phone]

## 2023-04-21 NOTE — Progress Notes (Signed)
SLP Cancellation Note  Patient Details Name: [name] [name] MRN: [id number] DOB: 10-18-1950   Cancelled treatment:       Patient missed 45 minutes of skilled SLP intervention as patient was receiving a shower from his wife during scheduled treatment time. Will re-attempt as able.                                                                                                 Aranza Geddes 04/21/2023, 12:05 PM

## 2023-04-21 NOTE — Progress Notes (Addendum)
PROGRESS NOTE   Subjective/Complaints  Pt up and down last night. Staff with him most of night. Slept from 0330 to 0630. Pt still confused this morning, calling out while I was rounding  ROS: Limited due to cognitive/behavioral   Objective:   DG CHEST PORT 1 VIEW  Result Date: 04/19/2023 CLINICAL DATA:  Cough. EXAM: PORTABLE CHEST 1 VIEW COMPARISON:  Chest x-ray dated February 28, 2023. FINDINGS: Poor evaluation of the heart and mediastinal contours due to patient positioning and rotation. The cardiomediastinal silhouette is grossly stable status post CABG. Low lung volumes with mild left basilar atelectasis, right basilar consolidation, and small right pleural effusion. No definite pneumothorax. No acute osseous abnormality. IMPRESSION: 1. Low lung volumes. Small right pleural effusion with adjacent right lower lobe atelectasis or infiltrate. Electronically Signed   By: Obie Dredge M.D.   On: 04/19/2023 15:35   No results for input(s): "WBC", "HGB", "HCT", "PLT" in the last 72 hours.      No results for input(s): "NA", "K", "CL", "CO2", "GLUCOSE", "BUN", "CREATININE", "CALCIUM" in the last 72 hours.       Intake/Output Summary (Last 24 hours) at 04/21/2023 1020 Last data filed at 04/21/2023 0823 Gross per 24 hour  Intake 838 ml  Output --  Net 838 ml           Physical Exam: Vital Signs Blood pressure (!) 130/102, pulse 91, temperature (!) 97.5 F (36.4 C), temperature source Axillary, resp. rate 16, height 5\' 4"  (1.626 m), weight 90.8 kg, SpO2 98%.   Constitutional: No distress . Vital signs reviewed. HEENT: NCAT, EOMI, oral membranes moist Neck: supple Cardiovascular: RRR without murmur. No JVD    Respiratory/Chest: CTA Bilaterally without wheezes or rales. Normal effort    GI/Abdomen: BS +, non-tender, non-distended Ext: no clubbing, cyanosis, or edema Psych: confused and anxious, labile Skin: No  evidence of breakdown, no evidence of rash.  Neurologic: AAO to self, consistently states this is [facility] despite correction and options, is oriented to month and year.  Stable from prior exams.  + Cognitive deficits, difficulty with recent memory, difficulty with concentration.  Better with distant memory. No changes today.  cranial nerves II through XII intact, motor strength is moving all 4 extremities 5- out of 5 throughout  Response to light touch in all 4 extremities, resting tremor noted again. Musculoskeletal: No obvious deformities.  No pain with range of motion in all 4 extremities. No joint swelling.       Assessment/Plan: 1. Functional deficits which require 15 hr per week of interdisciplinary therapy in a comprehensive inpatient rehab setting. Physiatrist is providing close team supervision and 24 hour management of active medical problems listed below. Physiatrist and rehab team continue to assess barriers to discharge/monitor patient progress toward functional and medical goals  Care Tool:  Bathing    Body parts bathed by patient: Right arm, Left arm, Chest, Abdomen, Front perineal area, Right upper leg, Buttocks, Left upper leg, Right lower leg, Left lower leg, Face   Body parts bathed by helper: Right arm, Left arm, Chest, Abdomen, Front perineal area, Buttocks, Right upper leg, Left upper leg, Right lower leg, Left lower leg,  Face     Bathing assist Assist Level: Total Assistance - Patient < 25%     Upper Body Dressing/Undressing Upper body dressing   What is the patient wearing?: Pull over shirt    Upper body assist Assist Level: Total Assistance - Patient < 25%    Lower Body Dressing/Undressing      Lower body dressing      What is the patient wearing?: Underwear/pull up, Pants     Lower body assist Assist for lower body dressing: Total Assistance - Patient < 25%     Toileting Toileting    Toileting assist Assist for toileting: Maximal  Assistance - Patient 25 - 49%     Transfers Chair/bed transfer  Transfers assist     Chair/bed transfer assist level: Moderate Assistance - Patient 50 - 74%     Locomotion Ambulation   Ambulation assist      Assist level: Moderate Assistance - Patient 50 - 74% Assistive device: Hand held assist Max distance: 40'   Walk 10 feet activity   Assist     Assist level: Minimal Assistance - Patient > 75% Assistive device: Hand held assist   Walk 50 feet activity   Assist Walk 50 feet with 2 turns activity did not occur: Safety/medical concerns  Assist level: Minimal Assistance - Patient > 75% Assistive device: No Device    Walk 150 feet activity   Assist Walk 150 feet activity did not occur: Safety/medical concerns  Assist level: Minimal Assistance - Patient > 75% Assistive device: No Device    Walk 10 feet on uneven surface  activity   Assist Walk 10 feet on uneven surfaces activity did not occur: Safety/medical concerns         Wheelchair     Assist Is the patient using a wheelchair?: Yes Type of Wheelchair: Manual    Wheelchair assist level: Dependent - Patient 0%      Wheelchair 50 feet with 2 turns activity    Assist        Assist Level: Dependent - Patient 0%   Wheelchair 150 feet activity     Assist      Assist Level: Dependent - Patient 0%   Blood pressure (!) 130/102, pulse 91, temperature (!) 97.5 F (36.4 C), temperature source Axillary, resp. rate 16, height 5\' 4"  (1.626 m), weight 90.8 kg, SpO2 98%.  Medical Problem List and Plan: 1. Functional deficits secondary to metabolic encephalopathy due to sepsis secondary to catheter peritonitis and hypoxemia              -patient may not shower             -ELOS/Goals: 16-18 days, PT/OT Sup, SLP min A -tentative discharge date 9-9           -Continue CIR therapies including PT, OT, and SLP   -[facility] -- encephalopathy/aggitation has developed  since approximately 8/22, with decline earlier following COVID infection per wife   -Therapy QD schedule  - Con't CIR PT, OT and SLP  - Patient was seen by palliative care, changed to DNR status -Marcelino Duster with palliative continuing to follow  -Tried wife at bedside with dialysis on 10-3, patient remained agitated and pulling at lines, per nephrology not a candidate for outpatient hemodialysis.    - 10/4: After discussion with medical, admissions, and social work team, current options appear to be transitioning patient back to PD versus placement at out-of-state SNF with HD capabilities versus converting  patient to hospice.  Nephrology discussed this with the wife, she is opting for vascular surgery consultation and attempt to revise back to PD to allow the patient to return home without hospice care.   2.  Antithrombotics: -DVT/anticoagulation:  Pharmaceutical: Heparin, 03/23/23 changed to Lovenox -  consistent refusal -  started on Eliquis 2.5 mg BID - Dced 9/12 per cardiology "With patient's chronic anemia and agitation/recent falls, appears that risk of OAC>benefits at this time... ". SCDs at bedtime added 10/4.              -antiplatelet therapy: aspirin and Plavix --> ASA alone per cardiology 9/11   3. Pain Management:              -Lidoderm patches to back, Tylenol every 4 hours as needed's, muscle rub as needed, and oxycodone 2.5 mg 3 times daily as needed  -Elavil 25 mg at bedtime for nerve pain and sleep 8/27 -weaned off 9-7 due to oversedation  - No use of PRNs or general complaints of pain since 9-29  4.  Lethargy/ intermittent agitation /insomnia: Persistent despite interventions  -Weaned off of veil bed, remains on telesitter for safety.  Appears somewhat better with wife at bedside and when seated at the station.             -Admitted on Seroquel 50 mg TID -weaned off             -clonazepam 0.5 mg TID prn -transition to lorazepam 0.5 mg daily p.o. or IV as needed for anxiety,  agitation -10-4 psychiatry premedicating dialysis with 0.5 mg ativan -Currently have Atarax first-line and Ativan 0.5 mg second line for PRNs for agitation; redirect and avoid overstimulation as much as possible. increasing sedating medications is much as possible, as there is a direct effect on patient's sleep, confusion, and agitation up to a day after receiving PRN antipsychotics and Ativan.              - Dced Geodon 10 mg IM q 12 hours prn d/t  switched to Abilify per psychiatry 9-17             -8-28 added depakote 125 mg q8h for ongoing aggitation, mood stabilization -discontinued 9-2, resumed 9-21, increased 9-23 - level 18 subtherapeutic increased to 250 mg Q12h 10/4  - 9-17 Abilify started per psychiatry, increased to 7.5 mg 9-27  - 9-24 memantine 5 mg daily started per psychiatry, discontinued 10-3 per family request due to increasing confusion and lethargy -Psychiatry following, appreciate their assistance and recommendations in managing this patient 10/5 sitters working to keep him calm. Responds to soothing sounds/relaxing music as well  5. Hypoactive delirium/metabolic encephalopathy: This patient is not capable of making decisions on his own behalf.  Persistent despite significant efforts to workup/treat.  -  8/29: EEG negative, 8-30: MRI showing chronic microvascular changes, otherwise normal.  Multiple lab workups for encephalopathy with CMP, ammonia, CBC negative except for low VPA.   -  10-2: Lethargic, jerking , BG 70s, improved with POs.  Resume blood glucose check twice daily. Repeat VPA levels (18).  CMP CBC look okay.    -    6. Skin/Wound Care: Routine skin care checks             -sacral skin off-loading/protection   7. Fluids/Electrolytes/Nutrition: Strict Is and Os and follow-up chemistries per nephrology             - renal diet/ 1200 cc FR  - Renvela  800 mg 3 times daily for hyperphosphatemia added per nephrology 8-27   -Management per nephrology, labs appear  approximately stable, p.o. intakes are appropriate at this time but wax and wane with lethargy    8: Hypotension/Hypertension: monitor TID and prn             -continue midodrine as needed during dialysis  9-1: add midodrine 5 mg tid and monitor.  DC Coreg as heart rate has been low to normal; may resume at low-dose once blood pressure improved.  9/13 DC midodrine   - Intermittent hypertension into the 150s, is higher on off dialysis days.  Would maintain higher perfusion pressure given microvascular changes seen on MRI.  No medication adjustments for now.  10/5 significant elevation this morning--may have been due to his agitation this morning. Other bp's in line    04/21/2023    6:40 AM 04/20/2023    9:38 PM 04/20/2023    7:59 PM  Vitals with BMI  Systolic 130 158 244  Diastolic 102 71 90  Pulse 91 100 54     9: Hyperlipidemia: continue statin   10: CAD s/p CABG 2023: on DAPT and statin.  - troponin elevated but stable, monitor 8/27   11: DM-2: A1c = 6.3% (at home on Lantus 22 units at bedtime, Tradjenta 5 mg daily)        -monitor PO intake             -continue SSI (0-6 units) does not appear to be requiring coverage   - 9/6 DC SSI, change CBGs to daily   - 10-2: Hypoglycemic today, 70s, improved with p.o. intakes, 88 at recheck.  Likely due to poor p.o. intakes last few days.  Start CBG checks twice daily  - 10-4: Patient agitated with CBG checks, refusing.  Moved to as needed for lethargy, AMS Recent Labs    04/19/23 1117 04/21/23 0038  GLUCAP 141* 128*     12: ESRD: HD on Tues, Thurs, Sat -nephrology made aware  -Regularly have to hold dialysis or stopped early due to agitated behaviors, which is ongoing.  Recently has been able to receive all or part of most dialysis sessions.  -Have tried premedicating patient and providing as needed medications with Haldol, Ativan as available on dialysis unit; measures helped temporarily but overall do not seem to affect  treatment  -Wife at bedside for dialysis 10-3, did not improve behaviors, per nephrology patient is not a candidate for outpatient dialysis.     -10/5  PD is really theonly option if wife wants to get him home. But vascular surgery is not recommending. There will need to be further dialogue Monday. Hospice may be the only alternative at this point.  13: COPD/OSA; chronic home 2L O2 (Symbicort, Ventolin, prednisone 40 mg daily at home)             -continue symbicort  -Added home oxygen back 8-27  -Satting well on room air, oxygen not required   14: GERD: continue Protonix   15: Non-sustained VT/pAF/bradycardia: per cardiology, Dr. Algie Coffer: -continue carvedilol 6.25 mg BID; consider increasing to 12.5 mg BID before starting low dose amiodarone  -consider Eliquis and discontinue asa and Plavix in one week if no active GI bleeding and stable hemoglobin from 8/24 -hemoglobin has remained stable but very low around 7, no recurrent A-fib, monitor for now given agitation/fall 8-31 and consider initiating Eliquis when safe -Some intermittent recurrent bradycardia while at inpatient rehab, improved with discontinuation of  Elavil and Geodon -9/11 EKG for bradycardia episode today, 2nd degree A-V block mobitz type 1.  Continue to hold beta blocker,9/11 will restart eliquis at 2.5 mg BID dose. Will consult cardiology regarding need for ASA/plavix and coreg.  Dr. Elease Hashimoto does not appear convinced that he has A-fib 9/12 per cardiology, DAPT transition to daily aspirin 81 only and Eliquis was discontinued - Heart rate currently well-controlled, hypertension as above, continuing to hold Eliquis due to falls/poor safety awareness, starting SCDs as above   16: HFrEF: Daily weights. Monitor for signs of overload  -Weights have remained overall stable/downtrending with hemodialysis since admission Filed Weights   04/17/23 1355 04/17/23 1755  Weight: 90.6 kg 90.8 kg      17: Liver nodularity on CT scan: LFTs  reported to be normal. Avoid hepatotoxic medications    -Abdominal ultrasound earlier this admission with minimal ascites  -Ammonia levels have remained normal on multiple checks   18: Epistaxis - resolved             -previously improved with flonase and saline spray   19: Morbid Obesity. Dietary counseling Body mass index is 34.36 kg/m.    20. Hematuria - resolved  9/22- required in/out to check U/A- having new hematuria- nursing to keep an eye- since also bleeding from penis a little- monitor   9/23 no wBCs  on UA, follow  urine culture-Staphylococcus hemolyticus-discussed with pharmacy and ID pharmacy.  Think this is more likely contaminant  9/25 discuss with nephrology about possible treatment or other evaluation options  Spoke with Dr. Ronalee Belts, most likely contaminant.  Recommended treating only if symptomatic.  If hematuria continues consider urology input  9/27 urine has improved to clear yellow   21.  Vitamin C deficiency.  Aggressive repletion per psychiatry, 100 mg IV x 6+ 500 mg p.o. daily.  Feel this may help his cognition.   23.  Chest pain a.m. 10-3.  EKG unchanged, self resolved.  -control bp as possible  LOS: 40 days A FACE TO FACE EVALUATION WAS PERFORMED  Ranelle Oyster 04/21/2023, 10:20 AM

## 2023-04-21 NOTE — Progress Notes (Signed)
At the start of the evening in bed watching out the window. Cooperative but drowsy. After interaction more alert and awake calling out for Clinical research associate. Set patient up in the chair with dinner. Initially having some difficulty with his dinner and coordinating putting food on the utensils; Did improve and ate 100% of dinner this evening. Did need assistance opening smaller sealed food packages. During this time attempted to have patient take his evening medications but refused.  Rounding on patient throughout the early evening hours and offering evening medication several times.Continues to refuse.  Sat and talked with patient around 2200 hour. Was alert to name, location, and situation. Was able to explain his Wife came to visit him today and did have dialysis treatment today. Was beginning to become tired but not wanting to go back to bed. Asked about wanting his legs propped up and explained would relieve current edema in lower extremities, but refused at this time. Encouraged him to take the sleep medication due to having poor sleep the night prior, but refused as well. Due to him being cooperative supportive of him staying in the recliner.  Self releasing alarm belt and Posey exit alarm pad continue to be on as a fall precaution for patient. As well as door is open to the room, one light on in the room, visual observation continues, has hospital socks on with grips, and fall pad to right side of him.  Legs propped up shortly after. Given a clean blanket as well as two bottles of water. Is calm and pleasant.

## 2023-04-21 NOTE — Progress Notes (Signed)
Assisted putting clothes back on patient and blankets placed on him. Lights turned off in room. Jazz music put on the computer. Is awake lying in bed at this time, but calm and not restless. Not calling out or responding to stimuli at this time.

## 2023-04-21 NOTE — Progress Notes (Signed)
Asleep from 0330 to 0630.

## 2023-04-21 NOTE — Progress Notes (Signed)
Occupational Therapy Cancellation Note  Patient Details  Name: [name] [name] MRN: [id number] Date of Birth: 10/09/1950  Today's Date: 04/21/2023 OT Missed Time: 60 Minutes Missed Time Reason: Unavailable (comment)  Pt missed 60 minutes of scheduled OT treatment due to being off unit receiving HD. Will make up missed therapy time at a later date when able.    Limmie Patricia, OTR/L,CBIS  Supplemental OT - [facility] and [facility] Secure Chat Preferred   04/21/2023, 2:41 PM

## 2023-04-21 NOTE — Progress Notes (Signed)
   04/21/23 1751  Vitals  Temp (!) 97.2 F (36.2 C)  Pulse Rate 75  Resp 18  BP (!) 133/104  SpO2 98 %  O2 Device Room Air  Type of Weight Post-Dialysis  Oxygen Therapy  Patient Activity (if Appropriate) In bed  Pulse Oximetry Type Continuous  Oximetry Probe Site Changed No  Post Treatment  Dialyzer Clearance Lightly streaked  Hemodialysis Intake (mL) 0 mL  Liters Processed 75.4  Fluid Removed (mL) 2500 mL  Tolerated HD Treatment Yes  AVG/AVF Arterial Site Held (minutes) 10 minutes  AVG/AVF Venous Site Held (minutes) 10 minutes   Received patient in bed to unit.  Alert and oriented.  Informed consent signed and in chart.   TX duration:3.5hrs  Patient tolerated well.  Transported back to the room  Alert, without acute distress.  Hand-off given to patient's nurse.   Access used: LAVF Access issues: none  Total UF removed: 2.5L Medication(s) given: atarax    [name]

## 2023-04-22 NOTE — Progress Notes (Incomplete Revision)
Given PRN medication for anxiety and restlessness. Reason for due to attempting to exit the bed multiple times with legs over the railings of the bed. Water given to patient with medication. Few minutes later patient spitting the medication across the room. Not demonstrating any aggressive

## 2023-04-22 NOTE — Progress Notes (Signed)
Transferred from bed to chair. Assist of 2. Continues to demonstrate drowsiness this evening. With ambulation increased weakness with lower extremities. Tremors observed with his hands. Rigidity observed with lower extremities. As well as rigidity with upper extremities as observed having 90 degree elbow flexion not moving elbows to neutral degree. Voice also becoming hoarse. Given water again. Able to talk to patient and agreed to take PRN medication for sleep and pain. Sat with patient briefly. One light on in room till able to fall asleep. Sleep music playing for him at this time.

## 2023-04-22 NOTE — Progress Notes (Signed)
Given PRN medication for anxiety and restlessness. Reason for due to attempting to exit the bed multiple times with legs over the railings of the bed. Water given to patient with medication. Few minutes later patient spitting the medication across the room.

## 2023-04-22 NOTE — Consult Note (Signed)
[name] [name] Psychiatry Consult Evaluation  Service Date: April 22, 2023 LOS:  LOS: 41 days    Primary Psychiatric Diagnoses  Delirium/Dementia 2.   Acute metabolic encephalopathy 3.   Vit C Deficiency    Assessment  [name] [name] is a 72 y.o. male admitted medically to the hospitalists service on 02/15/23 for confusion and SOB in the context of a recent COVID infection and admitted to acute care on 03/12/2023  4:25 PM for acute metabolic encephalopathy assumed due to recent COVID infection.  He has no past psychiatric diagnosis and has a past medical history of COPD, IDT2DM, GERD, and ESRD. Psychiatry was consulted for medication adjustment - lethargy, agitation, delirium Dr. Gasper Sells aware  by Dr. Shearon Stalls.  The patient's presentation remains most consistent with hypoactive delirium, given relatively acute onset, visual hallucinations, and agitation worse at night. Notably at this point (chronicity), incident dementia also high on differential - both periods of waxing and waning are significantly below premorbid baseline. Most likely due to multiple etiologies including but not limited to infection (recent COVID-19 infection), medications, pain, altered sleep/wake cycle, and limited mobility. Appreciate PT/OT/ST efforts in increasing pt activity during the day.   Patient has had no reported adverse affects to Abilify, which was started the last time psychiatry was involved. This time in discussion with Dr. Shearon Stalls and review of records, he is on the precipice of hospice if he is not able to remain calm during dialysis.   On evaluation today, patient is sitting upright in chair.  He is awake and alert.  He is oriented to person and place with visual prompts in his room.  He is unable to state the date mom to being available for him with encouragement to use.  Patient does not recall having dialysis yesterday.  When reviewed with him that he received his dialysis in his hospital room with his  wife at his bedside and dialysis lasted 3.5 hours he states, "if it is that easy I will have no problems with it".  When asked he then wants to continue dialysis he speaks in a loud affirmative voice, "yes!".  Patient is agreeable to continuing medications for anxiety prior to dialysis.  Review was that he worries about pain in his arm and pain in his chest when he receives dialysis.  Patient specifically denies any SI, HI, AVH.   Reviewed patient's chart which documents a low Depakote level with 3 times daily dosing.  Patient does not have a seizure disorder.  Review of more documents patient did not take her morning medications.  In an attempt to minimize medication burden, will change Depakote 3 times daily to an extended release version at night.  I have placed an order for a lab to be drawn in 4 days.   Psychiatry will continue to follow to evaluate effectiveness of medication and patient being able to complete dialysis.  Wife is in patient room on 04/19/2023, and plans to sit with patient during dialysis today.  She is hopeful that he can discharge with outpatient dialysis through [facility], where she would be able to accompany patient and stay during dialysis treatments.   Minimization of deliriogenic insults will continue to be of utmost importance. This includes promoting the normal circadian cycle, minimizing lines/tubes, avoiding deliriogenic medications such as benzodiazepines and anticholinergic medications, and frequently reorienting the patient. Symptomatic treatment for agitation can be provided by antipsychotic medications, though it is important to remember that these do not treat the underlying etiology  of delirium. Notably, there can be a time lag effect between treatment of a medical problem and resolution of delirium. This time lag effect may be of longer duration in the elderly, and those with underlying cognitive impairment or brain injury.   04/22/23-Progress note Patient seen face  to face in his hospital room. His chart was reviewed which revealed that he is still easily agitated but can be easily re-directed. Patient is alert, awake, and oriented to person and place not time. He reports favorable response to his current treatment as evidenced by decreased mood swings, agitation and irritability. Per staff, patient still has occasional night time agitation.  Diagnoses:  Active Hospital problems: Principal Problem:   Acute metabolic encephalopathy Active Problems:   Type 2 diabetes mellitus with chronic kidney disease on chronic dialysis, with long-term current use of insulin (HCC)   HFrEF (heart failure with reduced ejection fraction) (HCC)   Morbid obesity (HCC)   Encephalopathy   Epistaxis   PAF (paroxysmal atrial fibrillation) (HCC)   Long term (current) use of insulin (HCC)   ESRD on dialysis (HCC)   Debility   Agitation   Hematuria     Plan   ## Psychiatric Medication Recommendations:  -- Continue Abilify 7.5 mg, PO, daily at bedtime(agitation) -- Continue hydroxyzine once before dialysis (thought he was getting it today) and PRN before dialysis (itching) -- Continue Lorazepam 1 mg prior to dialysis on Tuesday, Thursday, and Saturday (note if there is a change in dialysis schedule, please adjust lorazepam dose to only be administered prior to dialysis) -- discontinue as needed lorazepam, as this may be disinhibiting.  -- continue hydroxyzine 10 mg 3 times daily as needed for itching and anxiety -- Change to Depakote ER 250 mg at bedtime which can be effective for mood stabilization.  VPA level to be drawn on 04/24/2023 -- Consider memantine if symptoms of delirium do not improve -- Continue Vit C; prefer IV d/t imminence of tx to hospice if no cognitive improvement   ## Medical Decision Making Capacity:  -- Clearly lacks for most decisions; wife is listed as HCPOA  ## Further Work-up:  -- Most recent EKG on 04/19/23 had QtC of 432 --Depakote level to  be drawn on 04/24/2023 -- Pertinent labwork reviewed earlier this admission includes: v low vit C    reasonably extensive work-up largely wnl with the exception of labs associated with ESRD including low eGFR, high BUN, and high albumin, all of which are associated with an increased risk of dementia, delirium, and acute metabolic encephalopathy.  ## Disposition:  -- Patient's wife prefers that patient be discharged to a rehab facility; if not possible she prefers he be discharged home and to transport patient to a dialysis center as needed. No psychiatric contraindications to this plan - defer to PT, OT, and medical assessment. Patient's wife states that a family member can accompany him to dialysis center to help reduce the risk and complications of agitation. Patient has responded well to Abilify and has had no reported significant agitation over the past 48 hours. -- Recommend a TOC consult  ## Behavioral / Environmental:  --  Delirium Precautions: Delirium Interventions for Nursing and Staff: - RN to open blinds every AM. - To Bedside: Glasses and pt's own shoes. Make available to patient when possible and encourage use. - Encourage po fluids when appropriate, keep fluids within reach. - OOB to chair with meals. - Passive ROM exercises to all extremities with AM & PM care. -  RN to assess orientation to person, time and place QAM and PRN. - Recommend extended visitation hours with familiar family/friends as feasible. - Staff to minimize disturbances at night. Turn off television when pt asleep or when not in use.  - When patient is awake and watching TV, his wife reports that he likes watching western movies, racing, boxing, or [place]  ## Safety and Observation Level:  - Based on my clinical evaluation, I estimate the patient to be at low risk of self harm in the current setting - At this time, we recommend a routine level of observation. This decision is based on my review of the chart  including patient's history and current presentation, interview of the patient, mental status examination, and consideration of suicide risk including evaluating suicidal ideation, plan, intent, suicidal or self-harm behaviors, risk factors, and protective factors. This judgment is based on our ability to directly address suicide risk, implement suicide prevention strategies and develop a safety plan while the patient is in the clinical setting. Please contact our team if there is a concern that risk level has changed.  Suicide risk assessment: Patient has following modifiable risk factors for suicide: access to guns, which we are addressing by the patient's wife planning to lock up the guns prior to the patient returning home.   Patient has following non-modifiable or demographic risk factors for suicide: male gender and psychiatric hospitalization  Patient has the following protective factors against suicide: Supportive family, Supportive friends, and no history of suicide attempts  Thank you for this consult request. Recommendations have been communicated to the primary team.  We will continue to follow at this time.   Thedore Mins, MD  Psychiatric and Social History   Relevant Aspects of Hospital Course:  Admitted on 02/15/23 for confusion and SOB in the context of a recent COVID infection. He was found to have sepsis secondary to peritoneal dialysis associated catheter peritonitis and polymicrobial bacteremia/fungemia. Broad spectrum antibiotics started. Hospital course complicated by worsening encephalopathy with hypoxemia requiring transfer to the ICU.   Patient Report:  Patient was awake, alert, oriented to person, and oriented with prompts to location.  He continues to endorse some anxiety related to dialysis, but wants to continue with treatment.  He specifically denies any SI, HI or AVH.  See medication changes above and plan.   Psych ROS:  Today denied SI, HI, AH/VH both historic  and current. Not able to do detailed sx inventory.   Depression: In hospital, patient displays signs of sleep disturbance, fatigue, diminished concentration and cognition, and psychomotor changes. 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  Anxiety: Patient did not express or display concerns for anxiety, but was somnolent throughout interview Mania (lifetime and current): No reported symptoms of mania  Psychosis: (lifetime and current): Patient's wife reports that over the course of his hospital stay, at times the patient had hallucinations about deceased or living family members who are not currently in the room with him. None reported in the past 48 hours.  Collateral information:  10/2 wife consented to PRN hydroxyzine and vit C supplementation.   Contacted patient's wife, [name] [name], at 3:35 pm on 04/04/23. She states that she first noticed changes in the patient's mental status shortly after patient had COVID. She states that he tested positive for COVID at an urgent care in [place]  on 01/28/23 and that around that time he began becoming more forgetful. On 02/14/23 the patient's wife observed that he thought he was in the bathroom when he was in the bedroom, and that he had recently been running a fever of 101.3 before she gave him a bath to cool him down, so she called an ambulance. He was transported to [facility] and subsequently transferred to [facility]. She reports that over the course of his 7-week hospital stay, he has become progressively worse. She reports that in the first few days of admission he was able to request specific television shows to watch, but that he has become progressively somnolent and has had periods with hallucinations. She reports that he has at times called out to deceased or living relatives who were not in the room.    Psychiatric History:  Information collected from patient's wife and EMR  Prev Psych Dx/Sx: None Current Psych Provider: None Home Meds (current): None Previous Psych Med Trials: No past history Therapy: No past history  Prior ECT: Wife denies Prior Psych Hospitalization: Wife denies Prior Self Harm: Wife denies Prior Violence: Wife denies  Family Psych History: Wife states that there is no known psych history in the patient's family Family Hx suicide: Wife denies  Social History:  Developmental Hx: None on record Educational Hx: Deferred Occupational Hx: Patient worked as a Naval architect for 32 years Legal Hx: Wife states patient currently has no legal issues Living Situation: Lives with wife at home Spiritual Hx: Patient normally attends church every Sunday and participates in Sunday school Access to weapons: Wife states that there are currently guns in the home, but she plans to have them locked up prior to the patient returning home  Substance History Tobacco use: Wife states patient does not use tobacco Alcohol use: Wife states patient does not use alcohol Drug use: Wife states patient does not use drugs   Exam Findings   Psychiatric Specialty Exam: Patient is oriented to self and place. Attention and concentration are good. He has anxious affect. He denies SI, HI, or AVH. He is cooperative throughout the interview and displays no signs of agitation.  Physical Exam: Vital signs:  Temp:  [97.2 F (36.2 C)-98.3 F (36.8 C)] 97.3 F (36.3 C) (10/05 2037) Pulse Rate:  [37-89] 76 (10/06 0920) Resp:  [13-24] 16 (10/06 0920) BP: (105-144)/(68-113) 123/68 (10/05 2037) SpO2:  [87 %-99 %] 95 % (10/06 0920)  Physical Exam Constitutional:      Appearance: Normal appearance. He is obese.  HENT:     Head: Normocephalic and atraumatic.  Eyes:     Conjunctiva/sclera: Conjunctivae normal.  Cardiovascular:     Rate and Rhythm: Normal rate.  Pulmonary:     Effort:  Pulmonary effort is normal. No respiratory distress.  Neurological:     General: No focal deficit present.    Blood pressure 123/68, pulse 76, temperature (!) 97.3 F (36.3 C), resp. rate 16, height 5\' 4"  (1.626 m), weight 94.3 kg, SpO2 95%. Body mass index is 35.68 kg/m.   Other History   These have been pulled in through the EMR, reviewed, and updated if appropriate.   Family History:  The patient's family history includes Arthritis in an other family member; Asthma in an other family member; Cancer in his mother; Diabetes in his father, mother, and sister; Hypertension in his father, mother, and sister; Lung disease in an other family member.  Medical History: Past Medical History:  Diagnosis Date   Anemia  Arthritis    Asthma    Bell palsy    CAD (coronary artery disease)    a. 2014 MV: abnl w/ infap ischemia; b. 03/2013 Cath: aneurysmal bleb in the LAD w/ otw nonobs dzs-->Med Rx.   Chronic back pain    Chronic knee pain    a. 09/2015 s/p R TKA.   Chronic pain    Chronic shoulder pain    Chronic sinusitis    COPD (chronic obstructive pulmonary disease) (HCC)    Diabetes mellitus without complication (HCC)    type II    ESRD on peritoneal dialysis (HCC)    on peritoneal dialysis, [facility]   Essential hypertension    GERD (gastroesophageal reflux disease)    Gout    Gout    Hepatomegaly    noted on noncontrast CT 2015   History of hiatal hernia    Hyperlipidemia    Lateral meniscus tear    Obesity    Truncal   Obstructive sleep apnea    does not use cpap    On home oxygen therapy    uses 2l when is going somewhere per patient    PUD (peptic ulcer disease)    remote, reports f/u EGD about 8 years ago unremarkable    Reactive airway disease    related to exposure to chemical during 9/11   Sinusitis    Vitamin D deficiency     Surgical History: Past Surgical History:  Procedure Laterality Date   A/V FISTULAGRAM N/A 02/28/2023   Procedure: A/V  Fistulagram;  Surgeon: Victorino Sparrow, MD;  Location: [facility] INVASIVE CV LAB;  Service: Cardiovascular;  Laterality: N/A;   ASAD LT SHOULDER  12/15/2008   left shoulder   AV FISTULA PLACEMENT Left 08/09/2016   Procedure: BRACHIOCEPHALIC ARTERIOVENOUS (AV) FISTULA CREATION LEFT ARM;  Surgeon: Sherren Kerns, MD;  Location: [facility] OR;  Service: Vascular;  Laterality: Left;   CAPD INSERTION N/A 10/07/2018   Procedure: LAPAROSCOPIC PERITONEAL CATHETER PLACEMENT;  Surgeon: Rodman Pickle, MD;  Location: [facility] ORS;  Service: General;  Laterality: N/A;   CATARACT EXTRACTION W/PHACO Left 03/28/2016   Procedure: CATARACT EXTRACTION PHACO AND INTRAOCULAR LENS PLACEMENT LEFT EYE;  Surgeon: Jethro Bolus, MD;  Location: [facility] ORS;  Service: Ophthalmology;  Laterality: Left;  CDE: 4.77   CATARACT EXTRACTION W/PHACO Right 04/11/2016   Procedure: CATARACT EXTRACTION PHACO AND INTRAOCULAR LENS PLACEMENT RIGHT EYE; CDE:  4.74;  Surgeon: Jethro Bolus, MD;  Location: [facility] ORS;  Service: Ophthalmology;  Laterality: Right;   COLONOSCOPY  10/15/2008   Fields: Rectal polyp obliterated, not retrieved, hemorrhoids, single ascending colon diverticulum near the CV. Next colonoscopy April 2020   COLONOSCOPY N/A 12/25/2014   SLF: 1. Colorectal polyps (2) removed 2. Small internal hemorrhoids 3. the left colon is severely redundant. hyperplastic polyps   CORONARY ARTERY BYPASS GRAFT N/A 06/05/2022   Procedure: OFF PUMP CORONARY ARTERY BYPASS GRAFTING (CABG) X 2 BYPASSES USING LEFT INTERNAL MAMMARY ARTERY AND RIGHT LEG GREATER SAPHENOUS VEIN HARVESTED ENDOSCOPICALLY;  Surgeon: Corliss Skains, MD;  Location: [facility] OR;  Service: Open Heart Surgery;  Laterality: N/A;   CORONARY STENT INTERVENTION N/A 07/25/2021   Procedure: CORONARY STENT INTERVENTION;  Surgeon: Tonny Bollman, MD;  Location: [facility] INVASIVE CV LAB;  Service: Cardiovascular;  Laterality: N/A;   CORONARY STENT INTERVENTION N/A 12/26/2021   Procedure: CORONARY STENT  INTERVENTION;  Surgeon: Yvonne Kendall, MD;  Location: [facility] INVASIVE CV LAB;  Service: Cardiovascular;  Laterality: N/A;   CORONARY STENT INTERVENTION  N/A 01/20/2022   Procedure: CORONARY STENT INTERVENTION;  Surgeon: Tonny Bollman, MD;  Location: [facility] INVASIVE CV LAB;  Service: Cardiovascular;  Laterality: N/A;   DOPPLER ECHOCARDIOGRAPHY     ESOPHAGOGASTRODUODENOSCOPY N/A 12/25/2014   SLF: 1. Anemia most likely due to CRI, gastritis, gastric polyps 2. Moderate non-erosive gastriits and mild duodenitis.  3.TWo large gstric polyps removed.    EYE SURGERY  12/22/2010   tear duct probing-[place]   FOREIGN BODY REMOVAL  03/29/2011   Procedure: REMOVAL FOREIGN BODY EXTREMITY;  Surgeon: Fuller Canada, MD;  Location: [facility] ORS;  Service: Orthopedics;  Laterality: Right;  Removal Foreign Body Right Thumb   IR FLUORO GUIDE CV LINE RIGHT  08/06/2018   IR FLUORO GUIDE CV LINE RIGHT  02/17/2023   IR US GUIDE VASC ACCESS RIGHT  08/06/2018   IR US GUIDE VASC ACCESS RIGHT  02/17/2023   KNEE ARTHROSCOPY  10/16/2007   left   KNEE ARTHROSCOPY WITH LATERAL MENISECTOMY Right 10/14/2015   Procedure: LEFT KNEE ARTHROSCOPY WITH PARTIAL LATERAL MENISECTOMY;  Surgeon: Vickki Hearing, MD;  Location: [facility]S;  Service: Orthopedics;  Laterality: Right;   LEFT HEART CATH AND CORONARY ANGIOGRAPHY N/A 07/25/2021   Procedure: LEFT HEART CATH AND CORONARY ANGIOGRAPHY;  Surgeon: Tonny Bollman, MD;  Location: [facility] INVASIVE CV LAB;  Service: Cardiovascular;  Laterality: N/A;   LEFT HEART CATH AND CORONARY ANGIOGRAPHY N/A 12/26/2021   Procedure: LEFT HEART CATH AND CORONARY ANGIOGRAPHY;  Surgeon: Yvonne Kendall, MD;  Location: [facility] INVASIVE CV LAB;  Service: Cardiovascular;  Laterality: N/A;   LEFT HEART CATH AND CORONARY ANGIOGRAPHY N/A 01/20/2022   Procedure: LEFT HEART CATH AND CORONARY ANGIOGRAPHY;  Surgeon: Tonny Bollman, MD;  Location: [facility] INVASIVE CV LAB;  Service: Cardiovascular;  Laterality: N/A;   LEFT HEART CATHETERIZATION  WITH CORONARY ANGIOGRAM N/A 03/28/2013   Procedure: LEFT HEART CATHETERIZATION WITH CORONARY ANGIOGRAM;  Surgeon: Marykay Lex, MD;  Location: [facility] CATH LAB;  Service: Cardiovascular;  Laterality: N/A;   NM MYOVIEW LTD     PENILE PROSTHESIS IMPLANT N/A 08/16/2015   Procedure: PENILE PROTHESIS INFLATABLE, three piece, Excisional biopsy of Penile ulcer, Penile molding;  Surgeon: Jethro Bolus, MD;  Location: [facility] ORS;  Service: Urology;  Laterality: N/A;   PENILE PROSTHESIS IMPLANT N/A 12/24/2017   Procedure: REMOVAL AND  REPLACEMENT  COLOPLAST PENILE PROSTHESIS;  Surgeon: Crista Elliot, MD;  Location: [facility] ORS;  Service: Urology;  Laterality: N/A;   PERIPHERAL VASCULAR BALLOON ANGIOPLASTY  02/28/2023   Procedure: PERIPHERAL VASCULAR BALLOON ANGIOPLASTY;  Surgeon: Victorino Sparrow, MD;  Location: [facility] INVASIVE CV LAB;  Service: Cardiovascular;;   PORT-A-CATH REMOVAL N/A 02/19/2023   Procedure: REMOVAL OF PERITONEAL DIALYSIS CATHETER;  Surgeon: Victorino Sparrow, MD;  Location: [facility] OR;  Service: Vascular;  Laterality: N/A;   QUADRICEPS TENDON REPAIR  07/21/2011   Procedure: REPAIR QUADRICEP TENDON;  Surgeon: Fuller Canada, MD;  Location: [facility] ORS;  Service: Orthopedics;  Laterality: Right;   RIGHT/LEFT HEART CATH AND CORONARY ANGIOGRAPHY N/A 05/30/2022   Procedure: RIGHT/LEFT HEART CATH AND CORONARY ANGIOGRAPHY;  Surgeon: Corky Crafts, MD;  Location: [facility] INVASIVE CV LAB;  Service: Cardiovascular;  Laterality: N/A;   TEE WITHOUT CARDIOVERSION N/A 06/05/2022   Procedure: TRANSESOPHAGEAL ECHOCARDIOGRAM (TEE);  Surgeon: Corliss Skains, MD;  Location: [facility] OR;  Service: Open Heart Surgery;  Laterality: N/A;   TOENAIL EXCISION     removed x2-bilateral   UMBILICAL HERNIA REPAIR  07/17/2005   [place]    Medications:   Current  Facility-Administered Medications:    (feeding supplement) PROSource Plus liquid 30 mL, 30 mL, Oral, BID BM, Stovall, Kathryn R, PA-C, 30 mL at 04/21/23 0937   0.9 %   sodium chloride infusion, , Intravenous, PRN, Angelina Sheriff, DO, Last Rate: 10 mL/hr at 04/18/23 1515, New Bag at 04/18/23 1515   acetaminophen (TYLENOL) tablet 325-650 mg, 325-650 mg, Oral, Q4H PRN, Milinda Antis, PA-C, 650 mg at 04/12/23 2036   ARIPiprazole (ABILIFY) tablet 7.5 mg, 7.5 mg, Oral, QHS, Fanny Dance, MD, 7.5 mg at 04/20/23 1945   [START ON 05/02/2023] ascorbic acid (VITAMIN C) tablet 500 mg, 500 mg, Oral, Daily, Angelina Sheriff, DO   aspirin EC tablet 81 mg, 81 mg, Oral, Daily, Milinda Antis, PA-C, 81 mg at 04/22/23 1035   atorvastatin (LIPITOR) tablet 80 mg, 80 mg, Oral, Daily, Setzer, Lynnell Jude, PA-C, 80 mg at 04/22/23 1035   bisacodyl (DULCOLAX) EC tablet 5 mg, 5 mg, Oral, Daily PRN, Setzer, Sandra J, PA-C   Chlorhexidine Gluconate Cloth 2 % PADS 6 each, 6 each, Topical, Q0600, Oretha Milch, PA-C, 6 each at 04/21/23 0830   Darbepoetin Alfa (ARANESP) injection 150 mcg, 150 mcg, Subcutaneous, Q Sun-1800, Paytes, Austin A, RPH, 150 mcg at 04/15/23 1959   dextrose 5 % 100 mL with ascorbic acid (VITAMIN C) 200 mg infusion, , Intravenous, Q T,Th,Sat-1800, Elijah Birk C, DO, Last Rate: 100 mL/hr at 04/22/23 0535, Infusion Verify at 04/22/23 0535   divalproex (DEPAKOTE ER) 24 hr tablet 250 mg, 250 mg, Oral, QHS, Mariel Craft, MD, 250 mg at 04/20/23 2135   feeding supplement (ENSURE ENLIVE / ENSURE PLUS) liquid 237 mL, 237 mL, Oral, BID WC, Setzer, Sandra J, PA-C, 237 mL at 04/22/23 1036   Gerhardt's butt cream 1 Application, 1 Application, Topical, QID, Setzer, Lynnell Jude, PA-C, 1 Application at 04/21/23 1201   guaiFENesin-dextromethorphan (ROBITUSSIN DM) 100-10 MG/5ML syrup 10 mL, 10 mL, Oral, Q6H PRN, Milinda Antis, PA-C, 10 mL at 04/03/23 1633   hydrOXYzine (ATARAX) tablet 10 mg, 10 mg, Oral, TID PRN, Elijah Birk C, DO, 10 mg at 04/21/23 1630   lidocaine (LIDODERM) 5 % 1 patch, 1 patch, Transdermal, Q2200, Setzer, Lynnell Jude, PA-C, 1 patch at 04/12/23  2035   lidocaine (LIDODERM) 5 % 1 patch, 1 patch, Transdermal, Q24H, Skip Mayer A, MD, 1 patch at 04/15/23 2000   lidocaine-prilocaine (EMLA) cream, , Topical, Once, Collins, Samantha G, PA-C   LORazepam (ATIVAN) tablet 1 mg, 1 mg, Oral, Q T,Th,Sa-HD, Mariel Craft, MD, 1 mg at 04/21/23 1200   mometasone-formoterol (DULERA) 200-5 MCG/ACT inhaler 2 puff, 2 puff, Inhalation, BID, Setzer, Lynnell Jude, PA-C, 2 puff at 04/22/23 0920   Muscle Rub CREA, , Topical, PRN, Fanny Dance, MD   ondansetron [facility]) tablet 4 mg, 4 mg, Oral, Q6H PRN, 4 mg at 03/16/23 2154 **OR** ondansetron (ZOFRAN) injection 4 mg, 4 mg, Intravenous, Q6H PRN, Setzer, Lynnell Jude, PA-C, 4 mg at 04/03/23 1603   oxyCODONE (Oxy IR/ROXICODONE) immediate release tablet 2.5 mg, 2.5 mg, Oral, TID PRN, Elijah Birk C, DO, 2.5 mg at 04/22/23 0015   pantoprazole (PROTONIX) EC tablet 40 mg, 40 mg, Oral, BID, Setzer, Lynnell Jude, PA-C, 40 mg at 04/22/23 1035   traZODone (DESYREL) tablet 50 mg, 50 mg, Oral, QHS PRN, Ranelle Oyster, MD, 50 mg at 04/22/23 0015   traZODone (DESYREL) tablet 50 mg, 50 mg, Oral, QHS, Ranelle Oyster, MD, 50 mg at 04/20/23 1945  Allergies: Allergies  Allergen Reactions   Opana [Oxymorphone Hcl] Itching   Tramadol Itching

## 2023-04-22 NOTE — Progress Notes (Signed)
[facility] Progress Note   Subjective:   Patient yelling out when I entered the room, asks me where his puppy is. Able to redirect and patient calmed down. He denies SOB, CP, dizziness, nausea.   Objective Vitals:   04/21/23 1840 04/21/23 2037 04/21/23 2048 04/22/23 0920  BP: 131/69 123/68    Pulse: 74 80 89 76  Resp: 17 16 16 16   Temp: 98.3 F (36.8 C) (!) 97.3 F (36.3 C)    TempSrc:      SpO2: 95% 92% 95% 95%  Weight:      Height:       Physical Exam General: Alert male in NAD Heart: RRR, no murmurs, rubs or gallops Lungs: CTA bilaterally, no wheezing, rhonchi or rales Abdomen: Soft, non-distended, +BS Extremities: No edema b/l lower extremities Dialysis Access:  LUE AVF + t/b    Additional Objective Labs: Basic Metabolic Panel: Recent Labs  Lab 04/18/23 1001  NA 134*  K 3.9  CL 94*  CO2 25  GLUCOSE 118*  BUN 36*  CREATININE 9.50*  CALCIUM 9.2   Liver Function Tests: Recent Labs  Lab 04/18/23 1001  AST 30  ALT 21  ALKPHOS 78  BILITOT 0.8  PROT 6.9  ALBUMIN 2.5*   No results for input(s): "LIPASE", "AMYLASE" in the last 168 hours. CBC: Recent Labs  Lab 04/18/23 1001  WBC 4.3  NEUTROABS 3.0  HGB 10.1*  HCT 33.6*  MCV 89.6  PLT 220   Blood Culture    Component Value Date/Time   SDES URINE, CATHETERIZED 04/08/2023 0915   SPECREQUEST  04/08/2023 0915    NONE Performed at [facility], [address] [place], [place] [address]    CULT >=100,000 COLONIES/mL STAPHYLOCOCCUS HAEMOLYTICUS (A) 04/08/2023 0915   REPTSTATUS 04/10/2023 FINAL 04/08/2023 0915    Cardiac Enzymes: No results for input(s): "CKTOTAL", "CKMB", "CKMBINDEX", "TROPONINI" in the last 168 hours. CBG: Recent Labs  Lab 04/17/23 0251 04/18/23 0909 04/18/23 0937 04/19/23 1117 04/21/23 0038  GLUCAP 104* 73 88 141* 128*   Iron Studies: No results for input(s): "IRON", "TIBC", "TRANSFERRIN", "FERRITIN" in the last 72  hours. @lablastinr3 @ Studies/Results: No results found. Medications:  sodium chloride 10 mL/hr at 04/18/23 1515   dextrose 5 % 100 mL with ascorbic acid (VITAMIN C) 200 mg infusion 100 mL/hr at 04/22/23 0535    (feeding supplement) PROSource Plus  30 mL Oral BID BM   ARIPiprazole  7.5 mg Oral QHS   [START ON 05/02/2023] ascorbic acid  500 mg Oral Daily   aspirin EC  81 mg Oral Daily   atorvastatin  80 mg Oral Daily   Chlorhexidine Gluconate Cloth  6 each Topical Q0600   darbepoetin (ARANESP) injection - DIALYSIS  150 mcg Subcutaneous Q Sun-1800   divalproex  250 mg Oral QHS   feeding supplement  237 mL Oral BID WC   Gerhardt's butt cream  1 Application Topical QID   lidocaine  1 patch Transdermal Q2200   lidocaine  1 patch Transdermal Q24H   lidocaine-prilocaine   Topical Once   LORazepam  1 mg Oral Q T,Th,Sa-HD   mometasone-formoterol  2 puff Inhalation BID   pantoprazole  40 mg Oral BID   traZODone  50 mg Oral QHS    Outpatient Dialysis Orders: Previously on [facility] thru [facility] - changed to HD - will need outpatient HD unit established prior to d/c. 4hr, 3K, EDW ~96, L AVF   Assessment/Plan: 1. Debility - in CIR  2. Encephalopathy/agitation: Waxing/waning agitation, dementia, sundowning. No acute findings on imaging. He has been disruptive during HD.  Did not improve with wife at bedside. Patient requires one on one HD for his safety, which is not possible in outpatient setting.  Use of IV Ativan can not be done in OP HD units. IR will not place catheter, agitation is not an indication to place catheter.  Considered switching to PD so patient can complete dialysis at home, but vascular surgery felt he was high risk for PD catheter placement. He is not a candidate for LTAC per SW. Unfortunately our options are limited to essentially hospice or waiting to see if mental status improves. His wife does not want him to go to hospice. Meds being adjusted per primary team/psychiatry  team.  3. ESRD: Transitioned from PD to HD recently on TTS schedule. Using old AVF which is still functional. Family would prefer TTS HD. Coordinate with rehab team so they know to premedicate before HD, but he is not always willing to take his medications. Using emla cream to decrease pain with cannulation.  4.Hypertension/volume: BP in goal. Midodrine 5mg  stopped. UF as tolerated.  5. Anemia: Hgb 10.1-on Aranesp weekly. Last Tsat 38% with ferritin 2175.  No iron.  6. Metabolic bone disease: CorrCa elevated , Phos acceptable. No VDRA. Needs updated labs with next HD 7.  Nutrition:  Alb 2.5, continue supps.  Renal diet w/fluid restrictions.  8.  Hx NSVT: On Coreg 10.  Recent culture negative PD cath-related peritonitis and associated fungemia + enterococcus bacteremia - underwent PD cath removal, TDC placement and removal, temp HD cath placement  and removal, and line holiday. Completed IV unasyn, IV micafungin and cipro on 8/15. TDC not replaced cause he had functional AVF.     Rogers Blocker, PA-C 04/22/2023, 9:41 AM  [facility] Pager: [phone]

## 2023-04-22 NOTE — Progress Notes (Signed)
Physical Therapy Session Note  Patient Details  Name: BURRELL HODAPP MRN: [id number] Date of Birth: January 06, 1951  Today's Date: 04/22/2023 PT Individual Time: 1127-1200 PT Individual Time Calculation (min): 33 min   Short Term Goals: Week 6:  PT Short Term Goal 1 (Week 6): To continue to consistently meet LTG  Skilled Therapeutic Interventions/Progress Updates:      Therapy Documentation Precautions:  Precautions Precautions: Fall Restrictions Weight Bearing Restrictions: No   Pt pleasant and lethargic throughout session and agreeable to PT. Pt declines pain and requests assistance with toileting. Pt transported to room from nurse station dependently and mod A with right hand held assist gait ~2 ft to restroom. Pt with increased body tremors, deferred to stand pivot with grab bar (min A). Pt continent of bowel and nurse notified. Pt min A for donning of shoes and set-up for peri-care. Pt transported to main gym and participated in blocked practice of 10 sit<>stand transfers with bilateral HHA, pt unable to decrease UE support due to impaired balance. Pt returned by w/c to nurses station and left seated with chair alarm on and all needs within reach.    Therapy/Group: Individual Therapy  Truitt Leep Truitt Leep PT, DPT  04/22/2023, 7:50 AM

## 2023-04-22 NOTE — Progress Notes (Signed)
PROGRESS NOTE   Subjective/Complaints  Pt with a better night in general. Still trying to get out of bed frequently. PRN's given.   ROS: Limited due to cognitive/behavioral   Objective:   No results found. No results for input(s): "WBC", "HGB", "HCT", "PLT" in the last 72 hours.      No results for input(s): "NA", "K", "CL", "CO2", "GLUCOSE", "BUN", "CREATININE", "CALCIUM" in the last 72 hours.       Intake/Output Summary (Last 24 hours) at 04/22/2023 0907 Last data filed at 04/22/2023 0535 Gross per 24 hour  Intake 116.05 ml  Output 2500 ml  Net -2383.95 ml           Physical Exam: Vital Signs Blood pressure 123/68, pulse 89, temperature (!) 97.3 F (36.3 C), resp. rate 16, height 5\' 4"  (1.626 m), weight 94.3 kg, SpO2 95%.   Constitutional: No distress . Vital signs reviewed. HEENT: NCAT, EOMI, oral membranes moist Neck: supple Cardiovascular: RRR without murmur. No JVD    Respiratory/Chest: CTA Bilaterally without wheezes or rales. Normal effort    GI/Abdomen: BS +, non-tender, non-distended Ext: no clubbing, cyanosis, or edema Psych: confused, sedated  Skin: No evidence of breakdown, no evidence of rash.  Neurologic: AAO to self, consistently states this is [facility] despite correction and options, is oriented to month and year.  Stable from prior exams.  + Cognitive deficits, difficulty with recent memory, difficulty with concentration.  Better with distant memory. No changes today.  cranial nerves II through XII intact, motor strength is moving all 4 extremities 5- out of 5 throughout  No changes today 10/6  Musculoskeletal: No obvious deformities.  No pain with range of motion in all 4 extremities. No joint swelling.       Assessment/Plan: 1. Functional deficits which require 15 hr per week of interdisciplinary therapy in a comprehensive inpatient rehab setting. Physiatrist is  providing close team supervision and 24 hour management of active medical problems listed below. Physiatrist and rehab team continue to assess barriers to discharge/monitor patient progress toward functional and medical goals  Care Tool:  Bathing    Body parts bathed by patient: Right arm, Left arm, Chest, Abdomen, Front perineal area, Right upper leg, Buttocks, Left upper leg, Right lower leg, Left lower leg, Face   Body parts bathed by helper: Right arm, Left arm, Chest, Abdomen, Front perineal area, Buttocks, Right upper leg, Left upper leg, Right lower leg, Left lower leg, Face     Bathing assist Assist Level: Total Assistance - Patient < 25%     Upper Body Dressing/Undressing Upper body dressing   What is the patient wearing?: Pull over shirt    Upper body assist Assist Level: Total Assistance - Patient < 25%    Lower Body Dressing/Undressing      Lower body dressing      What is the patient wearing?: Underwear/pull up, Pants     Lower body assist Assist for lower body dressing: Total Assistance - Patient < 25%     Toileting Toileting    Toileting assist Assist for toileting: Maximal Assistance - Patient 25 - 49%     Transfers Chair/bed transfer  Transfers  assist     Chair/bed transfer assist level: Moderate Assistance - Patient 50 - 74%     Locomotion Ambulation   Ambulation assist      Assist level: Moderate Assistance - Patient 50 - 74% Assistive device: Hand held assist Max distance: 40'   Walk 10 feet activity   Assist     Assist level: Minimal Assistance - Patient > 75% Assistive device: Hand held assist   Walk 50 feet activity   Assist Walk 50 feet with 2 turns activity did not occur: Safety/medical concerns  Assist level: Minimal Assistance - Patient > 75% Assistive device: No Device    Walk 150 feet activity   Assist Walk 150 feet activity did not occur: Safety/medical concerns  Assist level: Minimal Assistance -  Patient > 75% Assistive device: No Device    Walk 10 feet on uneven surface  activity   Assist Walk 10 feet on uneven surfaces activity did not occur: Safety/medical concerns         Wheelchair     Assist Is the patient using a wheelchair?: Yes Type of Wheelchair: Manual    Wheelchair assist level: Dependent - Patient 0%      Wheelchair 50 feet with 2 turns activity    Assist        Assist Level: Dependent - Patient 0%   Wheelchair 150 feet activity     Assist      Assist Level: Dependent - Patient 0%   Blood pressure 123/68, pulse 89, temperature (!) 97.3 F (36.3 C), resp. rate 16, height 5\' 4"  (1.626 m), weight 94.3 kg, SpO2 95%.  Medical Problem List and Plan: 1. Functional deficits secondary to metabolic encephalopathy due to sepsis secondary to catheter peritonitis and hypoxemia              -patient may not shower             -ELOS/Goals: 16-18 days, PT/OT Sup, SLP min A -tentative discharge date 9-9           -Continue CIR therapies including PT, OT, and SLP   -[facility] -- encephalopathy/aggitation has developed since approximately 8/22, with decline earlier following COVID infection per wife   -Therapy QD schedule  - Con't CIR PT, OT and SLP  - Patient was seen by palliative care, changed to DNR status -Marcelino Duster with palliative continuing to follow  -Tried wife at bedside with dialysis on 10-3, patient remained agitated and pulling at lines, per nephrology not a candidate for outpatient hemodialysis.    - 10/4: After discussion with medical, admissions, and social work team, current options appear to be transitioning patient back to PD versus placement at out-of-state SNF with HD capabilities versus converting patient to hospice.  Nephrology discussed this with the wife, she is opting for vascular surgery consultation and attempt to revise back to PD to allow the patient to return home without hospice care.   10/6 vascular  is not recommending conversion to PD. In speaking with palliative yesterday, they are in agreement that options are limited. Nephro looking at ways to make HD more comfortable/tolerable for him.  Wife will not consider hospice.   2.  Antithrombotics: -DVT/anticoagulation:  Pharmaceutical: Heparin, 03/23/23 changed to Lovenox -  consistent refusal -  started on Eliquis 2.5 mg BID - Dced 9/12 per cardiology "With patient's chronic anemia and agitation/recent falls, appears that risk of OAC>benefits at this time... ". SCDs at bedtime added 10/4.              -  antiplatelet therapy: aspirin and Plavix --> ASA alone per cardiology 9/11   3. Pain Management:              -Lidoderm patches to back, Tylenol every 4 hours as needed's, muscle rub as needed, and oxycodone 2.5 mg 3 times daily as needed  -Elavil 25 mg at bedtime for nerve pain and sleep 8/27 -weaned off 9-7 due to oversedation  - No use of PRNs or general complaints of pain since 9-29  4.  Lethargy/ intermittent agitation /insomnia: Persistent despite interventions  -Weaned off of veil bed, remains on telesitter for safety.  Appears somewhat better with wife at bedside and when seated at the station.             -Admitted on Seroquel 50 mg TID -weaned off             -clonazepam 0.5 mg TID prn -transition to lorazepam 0.5 mg daily p.o. or IV as needed for anxiety, agitation -10-4 psychiatry premedicating dialysis with 0.5 mg ativan -Currently have Atarax first-line and Ativan 0.5 mg second line for PRNs for agitation; redirect and avoid overstimulation as much as possible. increasing sedating medications is much as possible, as there is a direct effect on patient's sleep, confusion, and agitation up to a day after receiving PRN antipsychotics and Ativan.              - Dced Geodon 10 mg IM q 12 hours prn d/t  switched to Abilify per psychiatry 9-17             -8-28 added depakote 125 mg q8h for ongoing aggitation, mood stabilization  -discontinued 9-2, resumed 9-21, increased 9-23 - level 18 subtherapeutic increased to 250 mg Q12h 10/4  - 9-17 Abilify started per psychiatry, increased to 7.5 mg 9-27  - 9-24 memantine 5 mg daily started per psychiatry, discontinued 10-3 per family request due to increasing confusion and lethargy -Psychiatry following, appreciate their assistance and recommendations in managing this patient 10/5-6 sitters and staff working to keep him calm. Responds to soothing sounds/relaxing music as well. Agree with statement above about sedatives  5. Hypoactive delirium/metabolic encephalopathy: This patient is not capable of making decisions on his own behalf.  Persistent despite significant efforts to workup/treat.  -  8/29: EEG negative, 8-30: MRI showing chronic microvascular changes, otherwise normal.  Multiple lab workups for encephalopathy with CMP, ammonia, CBC negative except for low VPA.   -  10-2: Lethargic, jerking , BG 70s, improved with POs.  Resume blood glucose check twice daily. Repeat VPA levels (18).  CMP CBC look okay.    -    6. Skin/Wound Care: Routine skin care checks             -sacral skin off-loading/protection   7. Fluids/Electrolytes/Nutrition: Strict Is and Os and follow-up chemistries per nephrology             - renal diet/ 1200 cc FR  - Renvela 800 mg 3 times daily for hyperphosphatemia added per nephrology 8-27   -Management per nephrology, labs appear approximately stable, p.o. intakes are appropriate at this time but wax and wane with lethargy    8: Hypotension/Hypertension: monitor TID and prn             -continue midodrine as needed during dialysis  9-1: add midodrine 5 mg tid and monitor.  DC Coreg as heart rate has been low to normal; may resume at low-dose once blood pressure improved.  9/13  DC midodrine   - Intermittent hypertension into the 150s, is higher on off dialysis days.  Would maintain higher perfusion pressure given microvascular changes seen on MRI.   No medication adjustments for now.  10/6 transient elevation yesterday morning--improved today    04/21/2023    8:48 PM 04/21/2023    8:37 PM 04/21/2023    6:40 PM  Vitals with BMI  Systolic  123 131  Diastolic  68 69  Pulse 89 80 74     9: Hyperlipidemia: continue statin   10: CAD s/p CABG 2023: on DAPT and statin.  - troponin elevated but stable, monitor 8/27   11: DM-2: A1c = 6.3% (at home on Lantus 22 units at bedtime, Tradjenta 5 mg daily)        -monitor PO intake             -continue SSI (0-6 units) does not appear to be requiring coverage   - 9/6 DC SSI, change CBGs to daily   - 10-2: Hypoglycemic today, 70s, improved with p.o. intakes, 88 at recheck.  Likely due to poor p.o. intakes last few days.  Start CBG checks twice daily  - 10-4: Patient agitated with CBG checks, refusing.  Moved to as needed for lethargy, AMS Recent Labs    04/19/23 1117 04/21/23 0038  GLUCAP 141* 128*     12: ESRD: HD on Tues, Thurs, Sat -nephrology made aware  -Regularly have to hold dialysis or stopped early due to agitated behaviors, which is ongoing.  Recently has been able to receive all or part of most dialysis sessions.  -Have tried premedicating patient and providing as needed medications with Haldol, Ativan as available on dialysis unit; measures helped temporarily but overall do not seem to affect treatment  -Wife at bedside for dialysis 10-3, did not improve behaviors, per nephrology patient is not a candidate for outpatient dialysis.     -10/5-6 see discussion above. Wife tries to help in HD. However, behaviors continue to make HD difficult. 13: COPD/OSA; chronic home 2L O2 (Symbicort, Ventolin, prednisone 40 mg daily at home)             -continue symbicort  -Added home oxygen back 8-27  -Satting well on room air, oxygen not required   14: GERD: continue Protonix   15: Non-sustained VT/pAF/bradycardia: per cardiology, Dr. Algie Coffer: -continue carvedilol 6.25 mg BID; consider  increasing to 12.5 mg BID before starting low dose amiodarone  -consider Eliquis and discontinue asa and Plavix in one week if no active GI bleeding and stable hemoglobin from 8/24 -hemoglobin has remained stable but very low around 7, no recurrent A-fib, monitor for now given agitation/fall 8-31 and consider initiating Eliquis when safe -Some intermittent recurrent bradycardia while at inpatient rehab, improved with discontinuation of Elavil and Geodon -9/11 EKG for bradycardia episode today, 2nd degree A-V block mobitz type 1.  Continue to hold beta blocker,9/11 will restart eliquis at 2.5 mg BID dose. Will consult cardiology regarding need for ASA/plavix and coreg.  Dr. Elease Hashimoto does not appear convinced that he has A-fib 9/12 per cardiology, DAPT transition to daily aspirin 81 only and Eliquis was discontinued - Heart rate currently well-controlled, hypertension as above, continuing to hold Eliquis due to falls/poor safety awareness, starting SCDs as above   16: HFrEF: Daily weights. Monitor for signs of overload  -Weights have remained overall stable/downtrending with hemodialysis since admission Filed Weights   04/21/23 1337  Weight: 94.3 kg      17:  Liver nodularity on CT scan: LFTs reported to be normal. Avoid hepatotoxic medications    -Abdominal ultrasound earlier this admission with minimal ascites  -Ammonia levels have remained normal on multiple checks   18: Epistaxis - resolved             -previously improved with flonase and saline spray   19: Morbid Obesity. Dietary counseling Body mass index is 35.68 kg/m.    20. Hematuria - resolved  9/22- required in/out to check U/A- having new hematuria- nursing to keep an eye- since also bleeding from penis a little- monitor   9/23 no wBCs  on UA, follow  urine culture-Staphylococcus hemolyticus-discussed with pharmacy and ID pharmacy.  Think this is more likely contaminant  9/25 discuss with nephrology about possible treatment or  other evaluation options  Spoke with Dr. Ronalee Belts, most likely contaminant.  Recommended treating only if symptomatic.  If hematuria continues consider urology input  9/27 urine has improved to clear yellow   21.  Vitamin C deficiency.  Aggressive repletion per psychiatry, 100 mg IV x 6+ 500 mg p.o. daily.  Feel this may help his cognition.   23.  Chest pain a.m. 10-3.  EKG unchanged, self resolved.  -control bp as possible  LOS: 41 days A FACE TO FACE EVALUATION WAS PERFORMED  Ranelle Oyster 04/22/2023, 9:07 AM

## 2023-04-23 ENCOUNTER — Encounter (HOSPITAL_COMMUNITY): Payer: Medicare Other

## 2023-04-23 MED ORDER — CHLORHEXIDINE GLUCONATE CLOTH 2 % EX PADS: 6.0000 | MEDICATED_PAD | Freq: Every day | CUTANEOUS | Status: DC

## 2023-04-23 NOTE — Progress Notes (Signed)
Occupational Therapy Session Note  Patient Details  Name: MARQUIE ADERHOLD MRN: [id number] Date of Birth: 10-23-1950  Today's Date: 04/23/2023 OT Individual Time: 4098-1191 OT Individual Time Calculation (min): 24 min    Short Term Goals: Week 6:  OT Short Term Goal 1 (Week 6): LTG=STG 2/2 ELOS  Skilled Therapeutic Interventions/Progress Updates:    Pt greeted seated in wc at the nurses station. Pt was pleasant and cooperative this morning. Worked on standing balance/endurance with standing card matching activity. Pt needed min A to stand with improved initiation. Pt was able to name the names of the fish on each card, but had difficulty matching tem to the fish. He was, however, able to match the color of the cards instead. Pt took rest breaks and completed 3 more sit<>stands and tolerated 3 minutes standing at longest bout. Pt returned to nurses station with alarm on and needs met.   Therapy Documentation Precautions:  Precautions Precautions: Fall Restrictions Weight Bearing Restrictions: No Pain: Denies pain   Therapy/Group: Individual Therapy  [name] 04/23/2023, 9:52 AM

## 2023-04-23 NOTE — Progress Notes (Addendum)
Pt started off cooperative and calm at the beginning of the shift. He was sitting in his wheelchair at the nurse's station. Around 2200 he started expressing how he wanted to go to bed. This nurse and Cece, NT helped him get into bed after talking with him for awhile. Pt was easily distracted and would say he was ready to get in bed, but change his mind and state that he was going to stay up. After finally helping pt into bed, he told us "Goodnight" and let us cut off the lights and TV (as he said he didn't want anything on). Not even 10 minutes goes by, and telesitter alarm goes off and pt is climbing out the bed with one foot completely over the railing. This nurse and another nurse had to assist him to the wheelchair because he was on the verge of falling. Pt became agitated and insisted on staying in the wheelchair and he refused to go to the nurse's station. Another nurse, Rella Larve, RN, decided to sit with pt so that this nurse could finish my rounds with my other patients. Pt is currently in wheelchair, talking out in the hallway.    2357. Relieved Emmanuel and this nurse sat with pt in his room. Pt is still in wheelchair and refuses to get in bed,but expresses signs of sleepiness.  0100. Pt still in wheelchair and refuses to get in bed. States "I'm good I'm good". Offered to play music to relax him and he refused.  0300. Pt is still awake and in wheelchair. Refuses to go to bed.

## 2023-04-23 NOTE — Progress Notes (Signed)
Physical Therapy Session Note  Patient Details  Name: [name] [name] MRN: [id number] Date of Birth: [birth year]  Today's Date: 04/23/2023 PT Individual Time: 0930-0958 PT Individual Time Calculation (min): 28 min   Short Term Goals: Week 6:  PT Short Term Goal 1 (Week 6): To continue to consistently meet LTG  Skilled Therapeutic Interventions/Progress Updates:    Pt presents in [facility] seated behind nurse's station, agreeable to PT. Pt does not report pain. Session focused on gait training with variable assistive devices to promote improved tolerance to upright, dynamic standing balance, and independence with mobility. Pt demonstrates significant improvement in lethargy, attention, and safety awareness this session however does demonstrate some impulsivity with transfers. Pt completes sit<>stands with CGA/min assist throughout session.  Pt ambulates 30' with min/mod assist and 2nd person WC follow R HHA, demonstrates LOB forward and to L with fatigue, cued to take standing rest breaks to decreased LOB with pt able to recognize when having LOB and stops ambulating without cues. Pt then ambulates with RW 54' with 2nd person WC follow, min assist, cues to increase proximity to RW with fatigue as well as cues for obstacle negotiation. Pt then completes 2x35' walk with RW without WC follow, able to complete x2 turns and turn to sit in [facility] with cues for safe use of DME, positioning, and sequencing for turns.  Pt returns to sitting in [facility] with all needs with chair alarm donned and activated at end of session.    Therapy Documentation Precautions:  Precautions Precautions: Fall Restrictions Weight Bearing Restrictions: No  Therapy/Group: Individual Therapy  Edwin Cap PT, DPT 04/23/2023, 9:59 AM

## 2023-04-23 NOTE — Progress Notes (Signed)
PROGRESS NOTE   Subjective/Complaints  Remains intermittently agitated but redirectable overnight.  As needed utilization with Atarax and oxycodone, appears no further as needed Ativan as this is scheduled predialysis.  Responsive to music overnight.  No acute complaints.  Sitting up at nurses station, does brighten up when provider approaches and says "hey, how are you doing today!"  Denies any issues, concerns at this time.  Is oriented to being in rehab.    Last bowel movement 10-5.  Labs with dialysis.  Vitals appear stable.  No complaints of pain recently.  ROS:  Denies fevers, chills, N/V, abdominal pain, constipation, diarrhea, SOB, cough, chest pain, new weakness or paraesthesias.    Objective:   No results found. No results for input(s): "WBC", "HGB", "HCT", "PLT" in the last 72 hours.      No results for input(s): "NA", "K", "CL", "CO2", "GLUCOSE", "BUN", "CREATININE", "CALCIUM" in the last 72 hours.       Intake/Output Summary (Last 24 hours) at 04/23/2023 0756 Last data filed at 04/22/2023 1811 Gross per 24 hour  Intake 677 ml  Output --  Net 677 ml           Physical Exam: Vital Signs Blood pressure (!) 146/84, pulse 64, temperature 98.4 F (36.9 C), resp. rate 18, height 5\' 4"  (1.626 m), weight 94.3 kg, SpO2 98%.   Constitutional: No distress . Vital signs reviewed.  Sitting in wheelchair at nurses station. HEENT: NCAT, EOMI, oral membranes moist Cardiovascular: RRR without murmur. No JVD    Respiratory/Chest: CTA Bilaterally without wheezes or rales. Normal effort    GI/Abdomen: BS +, non-tender, non-distended Ext: no clubbing, cyanosis, or edema Psych: Pleasant, remains moderately lethargic/sedated Skin: No evidence of breakdown, no evidence of rash.  IV intact Neurologic: AAO to self, rehab, and year.  Not oriented to month.  + Cognitive deficits, difficulty with recent memory, difficulty  with concentration.  cranial nerves II through XII intact, Moving all 4 extremities antigravity and against resistance - -unchanged Bilateral upper extremity tremors at rest today.  Does appear to improve with intention. Musculoskeletal: No obvious deformities.  No pain with range of motion in all 4 extremities. No joint swelling.       Assessment/Plan: 1. Functional deficits which require 15 hr per week of interdisciplinary therapy in a comprehensive inpatient rehab setting. Physiatrist is providing close team supervision and 24 hour management of active medical problems listed below. Physiatrist and rehab team continue to assess barriers to discharge/monitor patient progress toward functional and medical goals  Care Tool:  Bathing    Body parts bathed by patient: Right arm, Left arm, Chest, Abdomen, Front perineal area, Right upper leg, Buttocks, Left upper leg, Right lower leg, Left lower leg, Face   Body parts bathed by helper: Right arm, Left arm, Chest, Abdomen, Front perineal area, Buttocks, Right upper leg, Left upper leg, Right lower leg, Left lower leg, Face     Bathing assist Assist Level: Total Assistance - Patient < 25%     Upper Body Dressing/Undressing Upper body dressing   What is the patient wearing?: Pull over shirt    Upper body assist Assist Level: Total Assistance -  Patient < 25%    Lower Body Dressing/Undressing      Lower body dressing      What is the patient wearing?: Underwear/pull up, Pants     Lower body assist Assist for lower body dressing: Total Assistance - Patient < 25%     Toileting Toileting    Toileting assist Assist for toileting: Maximal Assistance - Patient 25 - 49%     Transfers Chair/bed transfer  Transfers assist     Chair/bed transfer assist level: Moderate Assistance - Patient 50 - 74%     Locomotion Ambulation   Ambulation assist      Assist level: Moderate Assistance - Patient 50 - 74% Assistive device:  Hand held assist Max distance: 40'   Walk 10 feet activity   Assist     Assist level: Minimal Assistance - Patient > 75% Assistive device: Hand held assist   Walk 50 feet activity   Assist Walk 50 feet with 2 turns activity did not occur: Safety/medical concerns  Assist level: Minimal Assistance - Patient > 75% Assistive device: No Device    Walk 150 feet activity   Assist Walk 150 feet activity did not occur: Safety/medical concerns  Assist level: Minimal Assistance - Patient > 75% Assistive device: No Device    Walk 10 feet on uneven surface  activity   Assist Walk 10 feet on uneven surfaces activity did not occur: Safety/medical concerns         Wheelchair     Assist Is the patient using a wheelchair?: Yes Type of Wheelchair: Manual    Wheelchair assist level: Dependent - Patient 0%      Wheelchair 50 feet with 2 turns activity    Assist        Assist Level: Dependent - Patient 0%   Wheelchair 150 feet activity     Assist      Assist Level: Dependent - Patient 0%   Blood pressure (!) 146/84, pulse 64, temperature 98.4 F (36.9 C), resp. rate 18, height 5\' 4"  (1.626 m), weight 94.3 kg, SpO2 98%.  Medical Problem List and Plan: 1. Functional deficits secondary to metabolic encephalopathy due to sepsis secondary to catheter peritonitis and hypoxemia              -patient may not shower             -ELOS/Goals: 16-18 days, PT/OT Sup, SLP min A -tentative discharge date 9-9           -Continue CIR therapies including PT, OT, and SLP   -[facility] -- encephalopathy/aggitation has developed since approximately 8/22, with decline earlier following COVID infection per wife   -Therapy QD schedule  - Con't CIR PT, OT and SLP  - Patient was seen by palliative care, changed to DNR status -Marcelino Duster with palliative continuing to follow  -Tried wife at bedside with dialysis on 10-3, patient remained agitated and pulling at  lines, per nephrology not a candidate for outpatient hemodialysis.    - 10/4: After discussion with medical, admissions, and social work team, current options appear to be transitioning patient back to PD versus placement at out-of-state SNF with HD capabilities versus converting patient to hospice.  Nephrology discussed this with the wife, she is opting for vascular surgery consultation and attempt to revise back to PD to allow the patient to return home without hospice care.   10/6 vascular is not recommending conversion to PD. In speaking with palliative yesterday, they  are in agreement that options are limited. Nephro looking at ways to make HD more comfortable/tolerable for him.  Wife will not consider hospice.   10-7: Palliative signed off  2.  Antithrombotics: -DVT/anticoagulation:  Pharmaceutical: Heparin, 03/23/23 changed to Lovenox -  consistent refusal -  started on Eliquis 2.5 mg BID - Dced 9/12 per cardiology "With patient's chronic anemia and agitation/recent falls, appears that risk of OAC>benefits at this time... ". SCDs at bedtime added 10/4.              -antiplatelet therapy: aspirin and Plavix --> ASA alone per cardiology 9/11   3. Pain Management:              -Lidoderm patches to back, Tylenol every 4 hours as needed's, muscle rub as needed, and oxycodone 2.5 mg 3 times daily as needed  -Elavil 25 mg at bedtime for nerve pain and sleep 8/27 -weaned off 9-7 due to oversedation  - No use of PRNs or general complaints of pain since 9-29  4.  Lethargy/ intermittent agitation /insomnia: Persistent despite interventions  -Weaned off of veil bed, remains on telesitter for safety.  Appears somewhat better with wife at bedside and when seated at the station.             -Admitted on Seroquel 50 mg TID -weaned off             -clonazepam 0.5 mg TID prn -transition to lorazepam 0.5 mg daily p.o. or IV as needed for anxiety, agitation -10-4 psychiatry premedicating dialysis with 0.5 mg  ativan -Currently have Atarax first-line and Ativan 0.5 mg scheduled predialysis for agitation; redirect and avoid overstimulation as much as possible. increasing sedating medications is much as possible, as there is a direct effect on patient's sleep, confusion, and agitation up to a day after receiving PRN antipsychotics and Ativan.              - Dced Geodon 10 mg IM q 12 hours prn d/t  switched to Abilify per psychiatry 9-17             -8-28 added depakote 125 mg q8h for ongoing aggitation, mood stabilization -discontinued 9-2, resumed 9-21, increased 9-23 - level 18 subtherapeutic increased to 250 mg Q12h 10/4 -> psych changing to long-acting to 50 mg nightly 10-6; repeat levels 10-8  - 9-17 Abilify started per psychiatry, increased to 7.5 mg 9-27  - 9-24 memantine 5 mg daily started per psychiatry, discontinued 10-3 per family request due to increasing confusion and lethargy -Psychiatry following, appreciate their assistance and recommendations in managing this patient -10-7: Responsive to low stimulating environment, music, and redirection.  Remains intermittently agitated, especially with dialysis and at nighttime, but is significantly improving over the weeks.  Hopeful to gradually wean medications as mood becomes more stable.  5. Hypoactive delirium/metabolic encephalopathy: This patient is not capable of making decisions on his own behalf.  Persistent despite significant efforts to workup/treat.  -  8/29: EEG negative, 8-30: MRI showing chronic microvascular changes, otherwise normal.  Multiple lab workups for encephalopathy with CMP, ammonia, CBC negative except for low VPA.   -  10-2: Lethargic, jerking , BG 70s, improved with POs.  Resume blood glucose check twice daily. Repeat VPA levels (18).  CMP CBC look okay.    - 10-7: Some mild tremor this a.m., but otherwise cognition actually improved from prior exams, and movement not concerning for seizure.  P.o. is appropriate.   6.  Skin/Wound Care:  Routine skin care checks             -sacral skin off-loading/protection   7. Fluids/Electrolytes/Nutrition: Strict Is and Os and follow-up chemistries per nephrology             - renal diet/ 1200 cc FR  - Renvela 800 mg 3 times daily for hyperphosphatemia added per nephrology 8-27   -Management per nephrology, labs appear approximately stable, p.o. intakes are appropriate at this time but wax and wane with lethargy    8: Hypotension/Hypertension: monitor TID and prn             -continue midodrine as needed during dialysis  9-1: add midodrine 5 mg tid and monitor.  DC Coreg as heart rate has been low to normal; may resume at low-dose once blood pressure improved.  9/13 DC midodrine   - Intermittent hypertension into the 150s, is higher on off dialysis days.  Would maintain higher perfusion pressure given microvascular changes seen on MRI.  No medication adjustments for now.  10-7 well-controlled, with intermittent transient elevations.    04/23/2023    6:56 AM 04/22/2023    7:58 PM 04/22/2023    2:28 PM  Vitals with BMI  Systolic 146 97 134  Diastolic 84 67 69  Pulse 64 76 74     9: Hyperlipidemia: continue statin   10: CAD s/p CABG 2023: on DAPT and statin.  - troponin elevated but stable, monitor 8/27   11: DM-2: A1c = 6.3% (at home on Lantus 22 units at bedtime, Tradjenta 5 mg daily)        -monitor PO intake             -continue SSI (0-6 units) does not appear to be requiring coverage   - 9/6 DC SSI, change CBGs to daily   - 10-2: Hypoglycemic today, 70s, improved with p.o. intakes, 88 at recheck.  Likely due to poor p.o. intakes last few days.  Start CBG checks twice daily  - 10-4: Patient agitated with CBG checks, refusing.  Moved to as needed for lethargy, AMS Recent Labs    04/21/23 0038  GLUCAP 128*     12: ESRD: HD on Tues, Thurs, Sat -nephrology made aware  -Regularly have to hold dialysis or stopped early due to agitated behaviors, which is  ongoing.  Recently has been able to receive all or part of most dialysis sessions.  -Have tried premedicating patient and providing as needed medications with Haldol, Ativan as available on dialysis unit; measures helped temporarily but overall do not seem to affect treatment  -Wife at bedside for dialysis 10-3, did not improve behaviors, per nephrology patient is not a candidate for outpatient dialysis.     -10/5-6 see discussion above. Wife tries to help in HD. However, behaviors continue to make HD difficult.  -Unfortunately, vascular not offering conversion to PD.  Will discuss possible options with nephrology.   13: COPD/OSA; chronic home 2L O2 (Symbicort, Ventolin, prednisone 40 mg daily at home)             -continue symbicort  -Added home oxygen back 8-27  -Satting well on room air, oxygen not required   14: GERD: continue Protonix   15: Non-sustained VT/pAF/bradycardia: per cardiology, Dr. Algie Coffer: -continue carvedilol 6.25 mg BID; consider increasing to 12.5 mg BID before starting low dose amiodarone  -consider Eliquis and discontinue asa and Plavix in one week if no active GI bleeding and stable hemoglobin from 8/24 -hemoglobin has  remained stable but very low around 7, no recurrent A-fib, monitor for now given agitation/fall 8-31 and consider initiating Eliquis when safe -Some intermittent recurrent bradycardia while at inpatient rehab, improved with discontinuation of Elavil and Geodon -9/11 EKG for bradycardia episode today, 2nd degree A-V block mobitz type 1.  Continue to hold beta blocker,9/11 will restart eliquis at 2.5 mg BID dose. Will consult cardiology regarding need for ASA/plavix and coreg.  Dr. Elease Hashimoto does not appear convinced that he has A-fib 9/12 per cardiology, DAPT transition to daily aspirin 81 only and Eliquis was discontinued - Heart rate currently well-controlled, hypertension as above, continuing to hold Eliquis due to falls/poor safety awareness, starting SCDs as  above   16: HFrEF: Daily weights. Monitor for signs of overload  -Weights have remained overall stable/downtrending with hemodialysis since admission -Weights have not been repeated in some time, however patient appears normovolemic on exam. Filed Weights   04/21/23 1337  Weight: 94.3 kg      17: Liver nodularity on CT scan: LFTs reported to be normal. Avoid hepatotoxic medications    -Abdominal ultrasound earlier this admission with minimal ascites  -Ammonia levels have remained normal on multiple checks   18: Epistaxis - resolved             -previously improved with flonase and saline spray   19: Morbid Obesity. Dietary counseling Body mass index is 35.68 kg/m.    20. Hematuria - resolved  9/22- required in/out to check U/A- having new hematuria- nursing to keep an eye- since also bleeding from penis a little- monitor   9/23 no wBCs  on UA, follow  urine culture-Staphylococcus hemolyticus-discussed with pharmacy and ID pharmacy.  Think this is more likely contaminant  9/25 discuss with nephrology about possible treatment or other evaluation options  Spoke with Dr. Ronalee Belts, most likely contaminant.  Recommended treating only if symptomatic.  If hematuria continues consider urology input  9/27 urine has improved to clear yellow   21.  Vitamin C deficiency.  Aggressive repletion per psychiatry, 100 mg IV x 6+ 500 mg p.o. daily.  Feel this may help his cognition.  -10-7: Am noting some slightly improved attention, memory on gross exam.  23.  Chest pain a.m. 10-3.  EKG unchanged, self resolved.  -control bp as possible  LOS: 42 days A FACE TO FACE EVALUATION WAS PERFORMED  Angelina Sheriff 04/23/2023, 7:56 AM

## 2023-04-23 NOTE — Progress Notes (Signed)
   Palliative Medicine Inpatient Follow Up Note   The PMT continues to chart check for [name] [name].  ______________________________________________________________________________________ Lamarr Lulas [place] Palliative Medicine Team Team Cell Phone: [phone] Please utilize secure chat with additional questions, if there is no response within 30 minutes please call the above phone number  Palliative Medicine Team providers are available by phone from 7am to 7pm daily and can be reached through the team cell phone.  Should this patient require assistance outside of these hours, please call the patient's attending physician.

## 2023-04-23 NOTE — Progress Notes (Signed)
Resting in bed at this time. At the Nurses station till about 2200 this evening. Moments of frustration but no aggressive behavior observed. Able to be redirected verbally. Able to make his needs known. Assisted him in making phone call to his Wife and Daughter but unable to connect with them. Second Nurse giving patient a snack due to being hungry. Taking medication this evening. Continues to demonstrate weakness with lower extremities and difficulty pivoting from wheelchair to the bed. Once in bed was restless and at times non-compliant with safety precautions. Veterinary surgeon sitting with patient through the evening hours till 2300. Music turned on for patient to assist him in falling asleep.

## 2023-04-23 NOTE — Progress Notes (Signed)
[facility] Progress Note   Subjective:   Patient seen and examined at nurses station.  Per PT had a good session this morning.   Denies CP, SOB, abdominal pain and n/v/d.   Objective Vitals:   04/22/23 0920 04/22/23 1428 04/22/23 1958 04/23/23 0656  BP:  134/69 97/67 (!) 146/84  Pulse: 76 74 76 64  Resp: 16 18 17 18   Temp:  98.3 F (36.8 C) 97.8 F (36.6 C) 98.4 F (36.9 C)  TempSrc:  Oral    SpO2: 95% 97% 97% 98%  Weight:      Height:       Physical Exam General:Alert male in NAD, sitting in wheelchair at nurses station.  Heart:RRR, no mrg Lungs:+crackles in bases, nml WOB onRA Abdomen:soft, NTND Extremities:no LE edema Dialysis Access: LU AVF +b/t   Filed Weights   04/21/23 1337  Weight: 94.3 kg    Intake/Output Summary (Last 24 hours) at 04/23/2023 1330 Last data filed at 04/23/2023 1215 Gross per 24 hour  Intake 1037 ml  Output --  Net 1037 ml    Additional Objective Labs: Basic Metabolic Panel: Recent Labs  Lab 04/18/23 1001  NA 134*  K 3.9  CL 94*  CO2 25  GLUCOSE 118*  BUN 36*  CREATININE 9.50*  CALCIUM 9.2   Liver Function Tests: Recent Labs  Lab 04/18/23 1001  AST 30  ALT 21  ALKPHOS 78  BILITOT 0.8  PROT 6.9  ALBUMIN 2.5*   CBC: Recent Labs  Lab 04/18/23 1001  WBC 4.3  NEUTROABS 3.0  HGB 10.1*  HCT 33.6*  MCV 89.6  PLT 220   Medications:  sodium chloride 10 mL/hr at 04/18/23 1515   dextrose 5 % 100 mL with ascorbic acid (VITAMIN C) 200 mg infusion 100 mL/hr at 04/22/23 0535    (feeding supplement) PROSource Plus  30 mL Oral BID BM   ARIPiprazole  7.5 mg Oral QHS   [START ON 05/02/2023] ascorbic acid  500 mg Oral Daily   aspirin EC  81 mg Oral Daily   atorvastatin  80 mg Oral Daily   Chlorhexidine Gluconate Cloth  6 each Topical Q0600   darbepoetin (ARANESP) injection - DIALYSIS  150 mcg Subcutaneous Q Sun-1800   divalproex  250 mg Oral QHS   feeding supplement  237 mL Oral BID WC   Gerhardt's butt  cream  1 Application Topical QID   lidocaine  1 patch Transdermal Q2200   lidocaine  1 patch Transdermal Q24H   lidocaine-prilocaine   Topical Once   LORazepam  1 mg Oral Q T,Th,Sa-HD   mometasone-formoterol  2 puff Inhalation BID   pantoprazole  40 mg Oral BID   traZODone  50 mg Oral QHS    Dialysis Orders: Previously on [facility]CPD thru [facility] - changed to HD - will need outpatient HD unit established prior to d/c. 4hr, 3K, EDW ~96, L AVF    Assessment/Plan: 1. Debility - in CIR 2. Encephalopathy/agitation: Waxing/waning agitation, dementia, sundowning. No acute findings on imaging. He has been disruptive during HD.  Did not improve with wife at bedside. Patient requires one on one HD for his safety, which is not possible in outpatient setting.  Use of IV Ativan can not be done in OP HD units. IR will not place catheter, agitation is not an indication to place catheter.  Considered switching to PD so patient can complete dialysis at home, but vascular surgery felt he was high risk for PD catheter placement.  He is not a candidate for LTAC per SW. Unfortunately our options are limited to essentially hospice or waiting to see if mental status improves. His wife does not want him to go to hospice. Meds being adjusted per primary team/psychiatry team.  3. ESRD: Transitioned from PD to HD recently on TTS schedule. Using old AVF which is still functional. Family would prefer TTS HD. Coordinate with rehab team so they know to premedicate before HD, but he is not always willing to take his medications. Using emla cream to decrease pain with cannulation.  4.Hypertension/volume: BP in goal. Midodrine 5mg  stopped. UF as tolerated.  5. Anemia: Hgb 10.1-on Aranesp weekly. Last Tsat 38% with ferritin 2175.  No iron.  6. Metabolic bone disease: CorrCa elevated , Phos acceptable. No VDRA. Needs updated labs with next HD 7.  Nutrition:  Alb 2.5, continue supps.  Renal diet w/fluid restrictions.  8.   Hx NSVT: On Coreg 10.  Recent culture negative PD cath-related peritonitis and associated fungemia + enterococcus bacteremia - underwent PD cath removal, TDC placement and removal, temp HD cath placement  and removal, and line holiday. Completed IV unasyn, IV micafungin and cipro on 8/15. TDC not replaced cause he had functional AVF.   Virgina Norfolk, PA-C [facility] 04/23/2023,1:30 PM  LOS: 42 days

## 2023-04-23 NOTE — Progress Notes (Signed)
Speech Language Pathology Daily Session Note  Patient Details  Name: SAMIE REASONS MRN: [id number] Date of Birth: [birth year]  Today's Date: 04/23/2023 SLP Individual Time: 1130-1200 SLP Individual Time Calculation (min): 30 min  Short Term Goals: Week 6: SLP Short Term Goal 1 (Week 6): Patient will utilize external aids to orient to time and situation given modA SLP Short Term Goal 2 (Week 6): Pt will complete functional problem solving with 60% acc with max A. SLP Short Term Goal 3 (Week 6): Pt will attend to functional task for 5-7 minutes given mod multimodal cues. SLP Short Term Goal 4 (Week 6): Pt will answer biographical questions via multimodal communication accurately with 75% acc with mod A  Skilled Therapeutic Interventions: Skilled treatment session focused on cognitive goals. Upon arrival, patient was awake while upright in his wheelchair and remained calm and cooperative throughout. SLP facilitated session by providing overall Max A multimodal cues for basic problem solving during a basic calendar task in which he had to sequence the numbers. Patient demonstrated increased attention to task but had minimal awareness of errors. Patient also participated in an informal conversation regarding biographical information with language of confusion noted with patient demonstrating intermittent awareness of errors and ability to self-correct. Patient left upright at the nurse's station with alarm in place. Continue with current plan of care.      Pain No/Denies Pain   Therapy/Group: Individual Therapy  Makhya Arave 04/23/2023, 1:14 PM

## 2023-04-24 LAB — RENAL FUNCTION PANEL
Albumin: 2.6 g/dL — ABNORMAL LOW (ref 3.5–5.0)
Anion gap: 16 — ABNORMAL HIGH (ref 5–15)
BUN: 60 mg/dL — ABNORMAL HIGH (ref 8–23)
CO2: 23 mmol/L (ref 22–32)
Calcium: 9.2 mg/dL (ref 8.9–10.3)
Chloride: 94 mmol/L — ABNORMAL LOW (ref 98–111)
Creatinine, Ser: 10.93 mg/dL — ABNORMAL HIGH (ref 0.61–1.24)
GFR, Estimated: 5 mL/min — ABNORMAL LOW (ref >60)
Glucose, Bld: 129 mg/dL — ABNORMAL HIGH (ref 70–99)
Phosphorus: 4.9 mg/dL — ABNORMAL HIGH (ref 2.5–4.6)
Potassium: 4.1 mmol/L (ref 3.5–5.1)
Sodium: 133 mmol/L — ABNORMAL LOW (ref 135–145)

## 2023-04-24 LAB — CBC
HCT: 34.3 % — ABNORMAL LOW (ref 39.0–52.0)
Hemoglobin: 10.7 g/dL — ABNORMAL LOW (ref 13.0–17.0)
MCH: 28 pg (ref 26.0–34.0)
MCHC: 31.2 g/dL (ref 30.0–36.0)
MCV: 89.8 fL (ref 80.0–100.0)
Platelets: 170 K/uL (ref 150–400)
RBC: 3.82 MIL/uL — ABNORMAL LOW (ref 4.22–5.81)
RDW: 15.8 % — ABNORMAL HIGH (ref 11.5–15.5)
WBC: 4.2 K/uL (ref 4.0–10.5)
nRBC: 0 % (ref 0.0–0.2)

## 2023-04-24 LAB — VALPROIC ACID LEVEL: Valproic Acid Lvl: <10 ug/mL — ABNORMAL LOW (ref 50.0–100.0)

## 2023-04-24 MED ORDER — ALTEPLASE 2 MG IJ SOLR: 2.0000 mg | Freq: Once | INTRAMUSCULAR | Status: DC | PRN

## 2023-04-24 MED ORDER — LIDOCAINE HCL (PF) 1 % IJ SOLN: 5.0000 mL | INTRAMUSCULAR | Status: DC | PRN

## 2023-04-24 MED ORDER — HEPARIN SODIUM (PORCINE) 1000 UNIT/ML DIALYSIS: 1000.0000 [IU] | INTRAMUSCULAR | Status: DC | PRN

## 2023-04-24 MED ORDER — ANTICOAGULANT SODIUM CITRATE 4% (200MG/5ML) IV SOLN: 5.0000 mL | Status: DC | PRN

## 2023-04-24 MED ORDER — LIDOCAINE-PRILOCAINE 2.5-2.5 % EX CREA: 1.0000 | TOPICAL_CREAM | CUTANEOUS | Status: DC | PRN

## 2023-04-24 MED ORDER — PENTAFLUOROPROP-TETRAFLUOROETH EX AERO: 1.0000 | INHALATION_SPRAY | CUTANEOUS | Status: DC | PRN

## 2023-04-24 NOTE — Progress Notes (Signed)
PROGRESS NOTE   Subjective/Complaints  Overnight, refusing to go to sleep, climbing out of bed frequently and requiring 1:1 with nursing.  Patient does not remember this, states he slept well.  Denies any discomfort or anxiety at nighttime or with dialysis.  Memory and carryover remains poor.  Last bowel movement 10-5.  Labs with some HTN 140s last few reads, otherwise stable. POs adequate.   VPA levels <10 after switching to long acting. D/w psych whether appropriate to DC at this point. Also, neuro to give second opinion today or tomorrow on additional workup for persistent encephalopathy.   Last 2 days much more pleasant, less aggitated, more cooperative with therapies than prior. Last week Max A, this week Min A with everything. Per nephrology, still not a candidate for OP, but will keep good records on behaviors with sessions this week to give [place] idea of what/when interventions are needed.    ROS: Limited by cognition.  Denies fevers, chills, N/V, abdominal pain, constipation, diarrhea, SOB, cough, chest pain, new weakness or paraesthesias.    Objective:   No results found. No results for input(s): "WBC", "HGB", "HCT", "PLT" in the last 72 hours.      No results for input(s): "NA", "K", "CL", "CO2", "GLUCOSE", "BUN", "CREATININE", "CALCIUM" in the last 72 hours.       Intake/Output Summary (Last 24 hours) at 04/24/2023 0902 Last data filed at 04/23/2023 1834 Gross per 24 hour  Intake 817 ml  Output --  Net 817 ml           Physical Exam: Vital Signs Blood pressure (!) 147/80, pulse 78, temperature 98 F (36.7 C), temperature source Oral, resp. rate 17, height 5\' 4"  (1.626 m), weight 94.3 kg, SpO2 97%.   Constitutional: No distress . Vital signs reviewed.  Sitting in wheelchair at nurses station. HEENT: NCAT, EOMI, oral membranes moist Cardiovascular: RRR without murmur. No JVD    Respiratory/Chest: CTA  Bilaterally without wheezes or rales. Normal effort    GI/Abdomen: BS +, non-tender, non-distended Ext: no clubbing, cyanosis, or edema Psych: Pleasant, cooperative, remains moderately lethargic but bright and responsive to seeing familiar faces. Skin: No evidence of breakdown, no evidence of rash.  IV intact Neurologic: AAO to self, states places refill and not oriented to time with options.     + Cognitive deficits, difficulty with recent memory, difficulty with concentration.  cranial nerves II through XII intact, Moving all 4 extremities antigravity and against resistance, 5-/5 throughout Bilateral upper extremity and facial tremors at rest minimal today  musculoskeletal: No obvious deformities.  No pain with palpation of upper or lower extremities.  No joint swelling.   Assessment/Plan: 1. Functional deficits which require 15 hr per week of interdisciplinary therapy in a comprehensive inpatient rehab setting. Physiatrist is providing close team supervision and 24 hour management of active medical problems listed below. Physiatrist and rehab team continue to assess barriers to discharge/monitor patient progress toward functional and medical goals  Care Tool:  Bathing    Body parts bathed by patient: Right arm, Left arm, Chest, Abdomen, Front perineal area, Right upper leg, Buttocks, Left upper leg, Right lower leg, Left lower leg, Face  Body parts bathed by helper: Right arm, Left arm, Chest, Abdomen, Front perineal area, Buttocks, Right upper leg, Left upper leg, Right lower leg, Left lower leg, Face     Bathing assist Assist Level: Total Assistance - Patient < 25%     Upper Body Dressing/Undressing Upper body dressing   What is the patient wearing?: Pull over shirt    Upper body assist Assist Level: Total Assistance - Patient < 25%    Lower Body Dressing/Undressing      Lower body dressing      What is the patient wearing?: Underwear/pull up, Pants     Lower  body assist Assist for lower body dressing: Total Assistance - Patient < 25%     Toileting Toileting    Toileting assist Assist for toileting: Maximal Assistance - Patient 25 - 49%     Transfers Chair/bed transfer  Transfers assist     Chair/bed transfer assist level: Contact Guard/Touching assist     Locomotion Ambulation   Ambulation assist      Assist level: 2 helpers Assistive device: Walker-rolling Max distance: 90'   Walk 10 feet activity   Assist     Assist level: 2 helpers Assistive device: Hand held assist   Walk 50 feet activity   Assist Walk 50 feet with 2 turns activity did not occur: Safety/medical concerns  Assist level: Minimal Assistance - Patient > 75% Assistive device: No Device    Walk 150 feet activity   Assist Walk 150 feet activity did not occur: Safety/medical concerns  Assist level: Minimal Assistance - Patient > 75% Assistive device: No Device    Walk 10 feet on uneven surface  activity   Assist Walk 10 feet on uneven surfaces activity did not occur: Safety/medical concerns         Wheelchair     Assist Is the patient using a wheelchair?: Yes Type of Wheelchair: Manual    Wheelchair assist level: Dependent - Patient 0%      Wheelchair 50 feet with 2 turns activity    Assist        Assist Level: Dependent - Patient 0%   Wheelchair 150 feet activity     Assist      Assist Level: Dependent - Patient 0%   Blood pressure (!) 147/80, pulse 78, temperature 98 F (36.7 C), temperature source Oral, resp. rate 17, height 5\' 4"  (1.626 m), weight 94.3 kg, SpO2 97%.  Medical Problem List and Plan: 1. Functional deficits secondary to metabolic encephalopathy due to sepsis secondary to catheter peritonitis and hypoxemia              -patient may not shower             -ELOS/Goals: 16-18 days, PT/OT Sup, SLP min A -tentative discharge date 9-9   10/8: Patient continuing to make some gains with CIR  this week, but remains with waxing and waning cognitive status and agitation limiting dispo options.  Will work hard to try to get him to qualify for outpatient hemodialysis this week, if no improvements by 10/15 we will need to discuss dispo to SNF with HD.  Currently, no options for home dialysis.            -Continue CIR therapies including PT, OT, and SLP   -[facility] -- encephalopathy/aggitation has developed since approximately 8/22, with decline earlier following COVID infection per wife   -Therapy QD schedule  - Con't CIR PT, OT and SLP  -  Patient was seen by palliative care, changed to DNR status -Marcelino Duster with palliative continuing to follow  -Tried wife at bedside with dialysis on 10-3, patient remained agitated and pulling at lines, per nephrology not a candidate for outpatient hemodialysis.    - 10/4: After discussion with medical, admissions, and social work team, current options appear to be transitioning patient back to PD versus placement at out-of-state SNF with HD capabilities versus converting patient to hospice.  Nephrology discussed this with the wife, she is opting for vascular surgery consultation and attempt to revise back to PD to allow the patient to return home without hospice care.   10/6 vascular is not recommending conversion to PD.   10-7: Palliative signed off  10/8: Neurology to come re-evaluate for any additional workup/Tx ideas, likely tomorrow   2.  Antithrombotics: -DVT/anticoagulation:  Pharmaceutical: Heparin, 03/23/23 changed to Lovenox -  consistent refusal -  started on Eliquis 2.5 mg BID - Dced 9/12 per cardiology "With patient's chronic anemia and agitation/recent falls, appears that risk of OAC>benefits at this time... ". SCDs at bedtime added 10/4.              -antiplatelet therapy: aspirin and Plavix --> ASA alone per cardiology 9/11   3. Pain Management:              -Lidoderm patches to back, Tylenol every 4 hours as needed's,  muscle rub as needed, and oxycodone 2.5 mg 3 times daily as needed  -Elavil 25 mg at bedtime for nerve pain and sleep 8/27 -weaned off 9-7 due to oversedation  - No use of PRNs or general complaints of pain since 9-29  4.  Lethargy/ intermittent agitation /insomnia: Persistent despite interventions  -Weaned off of veil bed, remains on telesitter for safety.  Appears somewhat better with wife at bedside and when seated at the station.             -Admitted on Seroquel 50 mg TID -weaned off             -clonazepam 0.5 mg TID prn -transition to lorazepam 0.5 mg daily p.o. or IV as needed for anxiety, agitation -10-4 psychiatry premedicating dialysis with 0.5 mg ativan -Currently have Atarax first-line and Ativan 0.5 mg scheduled predialysis for agitation; redirect and avoid overstimulation as much as possible. increasing sedating medications is much as possible, as there is a direct effect on patient's sleep, confusion, and agitation up to a day after receiving PRN antipsychotics and Ativan.              - Dced Geodon 10 mg IM q 12 hours prn d/t  switched to Abilify per psychiatry 9-17             -8-28 added depakote 125 mg q8h for ongoing aggitation, mood stabilization -discontinued 9-2, resumed 9-21, increased 9-23 - level 18 subtherapeutic increased to 250 mg Q12h 10/4 -> psych changing to long-acting to 250 mg nightly 10-6; repeat levels 10-8 - <10  10-8: D/w psych adjusting vs. Dcing depakote given improving behaviors this week. Appreciate their insight and assistance.   - 9-17 Abilify started per psychiatry, increased to 7.5 mg 9-27  - 9-24 memantine 5 mg daily started per psychiatry, discontinued 10-3 per family request due to increasing confusion and lethargy -Psychiatry following, appreciate their assistance and recommendations in managing this patient - Responsive to low stimulating environment, music, and redirection.  Remains intermittently agitated, especially with dialysis and at  nighttime, but is significantly improving  over the weeks.   10/8: Poor sleep overnight, remains pleasant and cooperative today and appreciate overall slight improvement in behaviors this week.  5. Hypoactive delirium/metabolic encephalopathy: This patient is not capable of making decisions on his own behalf.  Persistent despite significant efforts to workup/treat.  -  8/29: EEG negative, 8-30: MRI showing chronic microvascular changes, otherwise normal.  Multiple lab workups for encephalopathy with CMP, ammonia, CBC negative except for low VPA.   -  10-2: Lethargic, jerking , BG 70s, improved with POs.  Resume blood glucose check twice daily. Repeat VPA levels (18).  CMP CBC look okay.    -10/8: Neuro also to eval as above; appreciate their assistance and recommendations.    6. Skin/Wound Care: Routine skin care checks             -sacral skin off-loading/protection   7. Fluids/Electrolytes/Nutrition: Strict Is and Os and follow-up chemistries per nephrology             - renal diet/ 1200 cc FR  - Renvela 800 mg 3 times daily for hyperphosphatemia added per nephrology 8-27   -Management per nephrology, labs appear approximately stable, p.o. intakes are appropriate at this time but wax and wane with lethargy    8: Hypotension/Hypertension: monitor TID and prn             -continue midodrine as needed during dialysis  9-1: add midodrine 5 mg tid and monitor.  DC Coreg as heart rate has been low to normal; may resume at low-dose once blood pressure improved.  9/13 DC midodrine   - Intermittent hypertension into the 150s, is higher on off dialysis days.  Would maintain higher perfusion pressure given microvascular changes seen on MRI.  No medication adjustments for now.  10-7 well-controlled, with intermittent transient elevations.    04/24/2023    4:10 AM 04/23/2023    8:16 PM 04/23/2023    2:10 PM  Vitals with BMI  Systolic 147 147 540  Diastolic 80 98 68  Pulse 78 62 76     9:  Hyperlipidemia: continue statin   10: CAD s/p CABG 2023: on DAPT and statin.  - troponin elevated but stable, monitor 8/27   11: DM-2: A1c = 6.3% (at home on Lantus 22 units at bedtime, Tradjenta 5 mg daily)        -monitor PO intake             -continue SSI (0-6 units) does not appear to be requiring coverage   - 9/6 DC SSI, change CBGs to daily   - 10-2: Hypoglycemic today, 70s, improved with p.o. intakes, 88 at recheck.  Likely due to poor p.o. intakes last few days.  Start CBG checks twice daily  - 10-4: Patient agitated with CBG checks, refusing.  Moved to as needed for lethargy, AMS No results for input(s): "GLUCAP" in the last 72 hours.    12: ESRD: HD on Tues, Thurs, Sat -nephrology made aware  -Regularly have to hold dialysis or stopped early due to agitated behaviors, which is ongoing.  Recently has been able to receive all or part of most dialysis sessions.  -Have tried premedicating patient and providing as needed medications with Haldol, Ativan as available on dialysis unit; measures helped temporarily but overall do not seem to affect treatment  -Wife at bedside for dialysis 10-3, did not improve behaviors, per nephrology patient is not a candidate for outpatient dialysis.     -10/5-6 see discussion above. Wife tries  to help in HD. However, behaviors continue to make HD difficult.  -Unfortunately, vascular not offering conversion to PD.  Will discuss possible options with nephrology.    - 10/8:  Nephro to proved detailed notes on dialysis behaviors this week to target; goal to be OP HD candidate  13: COPD/OSA; chronic home 2L O2 (Symbicort, Ventolin, prednisone 40 mg daily at home)             -continue symbicort  -Added home oxygen back 8-27  -Satting well on room air, oxygen not required   14: GERD: continue Protonix   15: Non-sustained VT/pAF/bradycardia: per cardiology, Dr. Algie Coffer: -continue carvedilol 6.25 mg BID; consider increasing to 12.5 mg BID before starting  low dose amiodarone  -consider Eliquis and discontinue asa and Plavix in one week if no active GI bleeding and stable hemoglobin from 8/24 -hemoglobin has remained stable but very low around 7, no recurrent A-fib, monitor for now given agitation/fall 8-31 and consider initiating Eliquis when safe -Some intermittent recurrent bradycardia while at inpatient rehab, improved with discontinuation of Elavil and Geodon -9/11 EKG for bradycardia episode today, 2nd degree A-V block mobitz type 1.  Continue to hold beta blocker,9/11 will restart eliquis at 2.5 mg BID dose. Will consult cardiology regarding need for ASA/plavix and coreg.  Dr. Elease Hashimoto does not appear convinced that he has A-fib 9/12 per cardiology, DAPT transition to daily aspirin 81 only and Eliquis was discontinued - Heart rate currently well-controlled, hypertension as above, continuing to hold Eliquis due to falls/poor safety awareness, starting SCDs as above   16: HFrEF: Daily weights. Monitor for signs of overload  -Weights have remained overall stable/downtrending with hemodialysis since admission -Weights have not been repeated in some time, however patient appears normovolemic on exam. Filed Weights   04/21/23 1337  Weight: 94.3 kg      17: Liver nodularity on CT scan: LFTs reported to be normal. Avoid hepatotoxic medications    -Abdominal ultrasound earlier this admission with minimal ascites  -Ammonia levels have remained normal on multiple checks   18: Epistaxis - resolved             -previously improved with flonase and saline spray   19: Morbid Obesity. Dietary counseling Body mass index is 35.68 kg/m.    20. Hematuria - resolved  9/22- required in/out to check U/A- having new hematuria- nursing to keep an eye- since also bleeding from penis a little- monitor   9/23 no wBCs  on UA, follow  urine culture-Staphylococcus hemolyticus-discussed with pharmacy and ID pharmacy.  Think this is more likely contaminant  9/25  discuss with nephrology about possible treatment or other evaluation options  Spoke with Dr. Ronalee Belts, most likely contaminant.  Recommended treating only if symptomatic.  If hematuria continues consider urology input  9/27 urine has improved to clear yellow   21.  Vitamin C deficiency.  Aggressive repletion per psychiatry, 100 mg IV x 6+ 500 mg p.o. daily.  Feel this may help his cognition.  -10-7: Am noting some slightly improved attention, memory on gross exam.  23.  Chest pain a.m. 10-3.  EKG unchanged, self resolved.  -control bp as possible  LOS: 43 days A FACE TO FACE EVALUATION WAS PERFORMED  Angelina Sheriff 04/24/2023, 9:02 AM

## 2023-04-24 NOTE — Progress Notes (Signed)
Patient ID: [name] [name], male   DOB: [birth year], 72 y.o.   MRN: [id number]  1133- SW spoke with pt wife to provide updates from team conference, and discussed in length about challenges with securing an outpatient HD clinic since pt is no PD appropriate. Wife understands will continue to monitor behaviors over the next week, and will discuss with nephrology if able to get an outpatient HD clinic to accept, if not, will have to consider an out-of-state HD clinic since not amenable to hospice/palliative care. Wife understands, and would like to see how he continues to progress over the week, and willing to discuss best plan of care when that time approaches. She reiterates hospice/palliative care is not an option.   Cecile Sheerer, MSW, LCSWA Office: [phone] Cell: [phone] Fax: [phone]

## 2023-04-24 NOTE — Progress Notes (Signed)
Speech Language Pathology Weekly Progress and Session Note  Patient Details  Name: [name] [name] MRN: [id number] Date of Birth: 06-26-1951  Beginning of progress report period: April 17, 2023 End of progress report period: April 24, 2023  Today's Date: 04/24/2023 SLP Individual Time: 0823-0850 SLP Individual Time Calculation (min): 27 min  Short Term Goals: Week 6: SLP Short Term Goal 1 (Week 6): Patient will utilize external aids to orient to time and situation given modA SLP Short Term Goal 1 - Progress (Week 6): Not met SLP Short Term Goal 2 (Week 6): Pt will complete functional problem solving with 60% acc with max A. SLP Short Term Goal 2 - Progress (Week 6): Not met SLP Short Term Goal 3 (Week 6): Pt will attend to functional task for 5-7 minutes given mod multimodal cues. SLP Short Term Goal 3 - Progress (Week 6): Met SLP Short Term Goal 4 (Week 6): Pt will answer biographical questions via multimodal communication accurately with 75% acc with mod A SLP Short Term Goal 4 - Progress (Week 6): Met    New Short Term Goals: Week 7: SLP Short Term Goal 1 (Week 7): Patient will utilize external aids to orient to time and situation given modA SLP Short Term Goal 2 (Week 7): Pt will complete functional problem solving with 60% acc with max A. SLP Short Term Goal 3 (Week 7): Pt will sustain attention to functional task for 10 minutes given mod multimodal cues. SLP Short Term Goal 4 (Week 7): Pt will answer biographical questions with 90% accuracy with mod A multimodal cues.  Weekly Progress Updates: Patient continues to make slow and limited gains and has met 2 of 4 STGs this reporting period. Patient's overall cognitive functioning is impacted by fluctuating levels of arousal, verbal frustration, and participation. Overall, patient requires Max A multimodal cues to complete functional and familiar tasks safely in regards to sustained attention, initiation, orientation with use of  external aids and problem solving. Patient and family education ongoing. Patient would benefit from continued skilled SLP intervention to maximize his cognitive functioning in order to reduce caregiver burden prior to discharge.      Intensity: Minumum of 1-2 x/day, 30 to 90 minutes Frequency: 1 to 3 out of 7 days (QD) Duration/Length of Stay: TBD d/t possible SNF placement Treatment/Interventions: Cognitive remediation/compensation;Environmental controls;Cueing hierarchy;Functional tasks;Therapeutic Activities;Internal/external aids;Patient/family education   Daily Session  Skilled Therapeutic Interventions:  Skilled treatment session focused on cognitive goals. Upon arrival, patient was awake and alert while upright in the wheelchair and remained calm and cooperative throughout session. SLP facilitated session by providing Min A verbal an visual cues for use of external aids for orientation to date with patient independently oriented to "rehab" and [name] [name]. Patient participated in a 4 step sequencing task with Min verbal cues needed for sustained attention to task for 5-7 minutes and required Mod visual cues for problem solving. Patient left upright in wheelchair at nurse's station. Continue with current plan of care.      Pain No/Denies Pain   Therapy/Group: Individual Therapy  [name] [name] 04/24/2023, 6:32 AM

## 2023-04-24 NOTE — Progress Notes (Signed)
   04/24/23 1711  Vitals  Temp (!) 97.5 F (36.4 C)  Temp Source Axillary  BP (!) 148/84  BP Location Right Arm  BP Method Automatic  Patient Position (if appropriate) Lying  Pulse Rate 98  Pulse Rate Source Monitor  Resp 20  Oxygen Therapy  SpO2 96 %  O2 Device Nasal Cannula  During Treatment Monitoring  Blood Flow Rate (mL/min) 349 mL/min  Arterial Pressure (mmHg) -143.63 mmHg  Venous Pressure (mmHg) 307.66 mmHg  TMP (mmHg) 17.37 mmHg  Ultrafiltration Rate (mL/min) 1943 mL/min  Dialysate Flow Rate (mL/min) 300 ml/min  Dialysate Potassium Concentration 3  Dialysate Calcium Concentration 2.5  Duration of HD Treatment -hour(s) 2.99 hour(s)  Cumulative Fluid Removed (mL) per Treatment  2923.67  HD Safety Checks Performed Yes  Intra-Hemodialysis Comments Tx completed  Post Treatment  Dialyzer Clearance Lightly streaked  Hemodialysis Intake (mL) 0 mL  Liters Processed 64  Fluid Removed (mL) 3000 mL  Tolerated HD Treatment Yes  Post-Hemodialysis Comments Pt goal met.  AVG/AVF Arterial Site Held (minutes) 10 minutes  AVG/AVF Venous Site Held (minutes) 10 minutes  Fistula / Graft Left Other (Comment) Arteriovenous fistula  Placement Date/Time: 08/09/16 1327   Placed prior to admission: No  Orientation: Left  Access Location: (c) Other (Comment)  Access Type: Arteriovenous fistula  Site Condition No complications  Fistula / Graft Assessment Present;Thrill;Bruit  Status Deaccessed  Needle Size 15  Drainage Description None

## 2023-04-24 NOTE — Consult Note (Signed)
[name] [name] Psychiatry Consult Evaluation  Service Date: April 24, 2023 LOS:  LOS: 43 days    Primary Psychiatric Diagnoses  Delirium/Dementia 2.   Acute metabolic encephalopathy 3.   Vit C Deficiency    Assessment  [name] [name] is a 72 y.o. male admitted medically to the hospitalists service on 02/15/23 for confusion and SOB in the context of a recent COVID infection and admitted to acute care on 03/12/2023  4:25 PM for acute metabolic encephalopathy assumed due to recent COVID infection.  He has no past psychiatric diagnosis and has a past medical history of COPD, IDT2DM, GERD, and ESRD. Psychiatry was consulted for medication adjustment - lethargy, agitation, delirium Dr. Gasper Sells aware  by Dr. Shearon Stalls.  The patient's presentation remains most consistent with hypoactive delirium, given relatively acute onset, visual hallucinations, and agitation worse at night. Notably at this point (chronicity), incident dementia also high on differential - both periods of waxing and waning are significantly below premorbid baseline. Most likely due to multiple etiologies including but not limited to infection (recent COVID-19 infection), medications, pain, altered sleep/wake cycle, and limited mobility. Appreciate PT/OT/ST efforts in increasing pt activity during the day. He is fluctuating around a lower baseline than prior to this hospitalization.   Patient has had no reported adverse affects to Abilify, which was started the last time psychiatry was involved. Generally he has had improvement in alertness without episodes of agitation since this is started, although continues to have agitation intermittently during dialysis. This time in discussion with Dr. Shearon Stalls and review of records, he was on the precipice of hospice if he was not able to remain calm during dialysis. Psychiatric interventions over past couple of weeks have been threefold - PRNs before dialysis, repletion of vitamin C (which I think is  largely responsible for improvement in alertness, and streamlining of overall medication regimen.   Psychiatry will continue to follow, seeing likely once later this week. Specifically if he tolerates dc of depakote without worsening of agitation, we will sign off. I do not think it is doing much at current dose (level <10), and there is no reason to increase the dose (risk > benefit).    Minimization of deliriogenic insults will continue to be of utmost importance. This includes promoting the normal circadian cycle, minimizing lines/tubes, avoiding deliriogenic medications such as benzodiazepines and anticholinergic medications, and frequently reorienting the patient. Symptomatic treatment for agitation can be provided by antipsychotic medications, though it is important to remember that these do not treat the underlying etiology of delirium. Notably, there can be a time lag effect between treatment of a medical problem and resolution of delirium. This time lag effect may be of longer duration in the elderly, and those with underlying cognitive impairment or brain injury.   Diagnoses:  Active Hospital problems: Principal Problem:   Acute metabolic encephalopathy Active Problems:   Type 2 diabetes mellitus with chronic kidney disease on chronic dialysis, with long-term current use of insulin (HCC)   HFrEF (heart failure with reduced ejection fraction) (HCC)   Morbid obesity (HCC)   Encephalopathy   Epistaxis   PAF (paroxysmal atrial fibrillation) (HCC)   Long term (current) use of insulin (HCC)   ESRD on dialysis (HCC)   Debility   Agitation   Hematuria     Plan   ## Psychiatric Medication Recommendations:  -- Continue Abilify 7.5 mg, PO, daily at bedtime(agitation - effective) -- Hydroxyzine  PRN before dialysis (itching) -- Start lorazepam 1 mg  prior to dialysis on Tuesday, Thursday, and Saturday (note if there is a change in dialysis schedule, please adjust lorazepam dose to only be  administered prior to dialysis) -- discontinue as needed lorazepam, as this may be disinhibiting.   -- DC  Depakote ER 250 mg - undetectable and likely not neede.d   -- Consider memantine if symptoms of delirium do not improve -- Continue Vit C - oral OK now that IV course over.   ## Medical Decision Making Capacity:  -- Clearly lacks for most decisions; wife is listed as HCPOA  ## Further Work-up:  -- Most recent EKG on 04/19/23 had QtC of 432 --Depakote level to be drawn on 04/24/2023 -- Pertinent labwork reviewed earlier this admission includes: v low vit C    reasonably extensive work-up largely wnl with the exception of labs associated with ESRD including low eGFR, high BUN, and high albumin, all of which are associated with an increased risk of dementia, delirium, and acute metabolic encephalopathy.  ## Disposition:  -- Patient's wife prefers that patient be discharged to a rehab facility; if not possible she prefers he be discharged home and to transport patient to a dialysis center as needed. No psychiatric contraindications to this plan - defer to PT, OT, and medical assessment. Patient's wife states that a family member can accompany him to dialysis center to help reduce the risk and complications of agitation. Patient has responded well to Abilify and has had no reported significant agitation over the past 48 hours. -- Recommend a TOC consult  ## Behavioral / Environmental:  --  Delirium Precautions: Delirium Interventions for Nursing and Staff: - RN to open blinds every AM. - To Bedside: Glasses and pt's own shoes. Make available to patient when possible and encourage use. - Encourage po fluids when appropriate, keep fluids within reach. - OOB to chair with meals. - Passive ROM exercises to all extremities with AM & PM care. - RN to assess orientation to person, time and place QAM and PRN. - Recommend extended visitation hours with familiar family/friends as feasible. -  Staff to minimize disturbances at night. Turn off television when pt asleep or when not in use.  - When patient is awake and watching TV, his wife reports that he likes watching western movies, racing, boxing, or [place]  ## Safety and Observation Level:  - Based on my clinical evaluation, I estimate the patient to be at low risk of self harm in the current setting - At this time, we recommend a routine level of observation. This decision is based on my review of the chart including patient's history and current presentation, interview of the patient, mental status examination, and consideration of suicide risk including evaluating suicidal ideation, plan, intent, suicidal or self-harm behaviors, risk factors, and protective factors. This judgment is based on our ability to directly address suicide risk, implement suicide prevention strategies and develop a safety plan while the patient is in the clinical setting. Please contact our team if there is a concern that risk level has changed.  Suicide risk assessment: Patient has following modifiable risk factors for suicide: access to guns, which we are addressing by the patient's wife planning to lock up the guns prior to the patient returning home.   Patient has following non-modifiable or demographic risk factors for suicide: male gender and psychiatric hospitalization  Patient has the following protective factors against suicide: Supportive family, Supportive friends, and no history of suicide attempts  Thank you for this consult  request. Recommendations have been communicated to the primary team.  We will continue to follow at this time.   Baylin Gamblin A Deneise Getty  Psychiatric and Social History   Relevant Aspects of Hospital Course:  Admitted on 02/15/23 for confusion and SOB in the context of a recent COVID infection. He was found to have sepsis secondary to peritoneal dialysis associated catheter peritonitis and polymicrobial bacteremia/fungemia.  Broad spectrum antibiotics started. Hospital course complicated by worsening encephalopathy with hypoxemia requiring transfer to the ICU.   Patient Report:  Pt was awake, alert, and oriented. Attenttion remains poor (when asked to count from 15-8 he went 15, 9,...`1) but he was at least able to attempt the task. He did make several small jokes and laughed at appropriate points during interview - showing a lot more personality than previously. Not really aware of anything going on in the world outside of the hospital. He is very focused on having a good dialysis session "so I can leave".   Psych ROS:  Today denied SI, HI, AH/VH both historic and current. Not able to do detailed sx inventory d/t dementia.   Depression: In hospital, patient displays signs of sleep disturbance, fatigue, diminished concentration and cognition, and psychomotor changes. 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  Anxiety: Patient did not express or display concerns for anxiety, but was somnolent throughout interview Mania (lifetime and current): No reported symptoms of mania  Psychosis: (lifetime and current): Patient's wife reports that over the course of his hospital stay, at times the patient had hallucinations about deceased or living family members who are not currently in the room with him. None reported in the past 48 hours.  Collateral information:  10/8: Wife has seen a significant improvement in the past week - much more alert, able to listen, having better conversations. She is agreeable to dc depakote. Went over his current psychotropic medications. Informed her we will likely sign off if he does well after dc of Depakote.    Psychiatric History:  Information collected from patient's wife and EMR  Prev Psych Dx/Sx: None Current Psych Provider: None Home Meds  (current): None Previous Psych Med Trials: No past history Therapy: No past history  Prior ECT: Wife denies Prior Psych Hospitalization: Wife denies Prior Self Harm: Wife denies Prior Violence: Wife denies  Family Psych History: Wife states that there is no known psych history in the patient's family Family Hx suicide: Wife denies  Social History:  Developmental Hx: None on record Educational Hx: Deferred Occupational Hx: Patient worked as a Naval architect for 32 years Legal Hx: Wife states patient currently has no legal issues Living Situation: Lives with wife at home Spiritual Hx: Patient normally attends church every Sunday and participates in Sunday school Access to weapons: Wife states that there are currently guns in the home, but she plans to have them locked up prior to the patient returning home  Substance History Tobacco use: Wife states patient does not use tobacco Alcohol use: Wife states patient does not use alcohol Drug use: Wife states patient does not use drugs   Exam Findings   Psychiatric Specialty Exam: Patient is oriented to self and place; much more oriented to situation. Attention and concentration are poor, likely new baseline. He describes anxiety about dialysis, but affect is full range and appropriately changes w/ topic of conversation. Insight  is fair; judgment is fair to poor. He denies SI, HI, or AVH. He is cooperative throughout the interview and displays no signs of agitation.  Physical Exam: Vital signs:  Temp:  [98 F (36.7 C)-98.8 F (37.1 C)] 98.8 F (37.1 C) (10/08 1332) Pulse Rate:  [62-79] 72 (10/08 1500) Resp:  [17-23] 18 (10/08 1500) BP: (122-148)/(70-98) 139/86 (10/08 1500) SpO2:  [93 %-98 %] 94 % (10/08 1500) Weight:  [91.4 kg] 91.4 kg (10/08 1327)  Physical Exam Constitutional:      Appearance: Normal appearance. He is obese.  HENT:     Head: Normocephalic and atraumatic.  Eyes:     Conjunctiva/sclera: Conjunctivae normal.   Cardiovascular:     Rate and Rhythm: Normal rate.  Pulmonary:     Effort: Pulmonary effort is normal. No respiratory distress.  Neurological:     General: No focal deficit present.   Blood pressure 139/86, pulse 72, temperature 98.8 F (37.1 C), temperature source Oral, resp. rate 18, height 5\' 4"  (1.626 m), weight 91.4 kg, SpO2 94%. Body mass index is 34.59 kg/m.   Other History   These have been pulled in through the EMR, reviewed, and updated if appropriate.   Family History:  The patient's family history includes Arthritis in an other family member; Asthma in an other family member; Cancer in his mother; Diabetes in his father, mother, and sister; Hypertension in his father, mother, and sister; Lung disease in an other family member.  Medical History: Past Medical History:  Diagnosis Date   Anemia    Arthritis    Asthma    Bell palsy    CAD (coronary artery disease)    a. 2014 MV: abnl w/ infap ischemia; b. 03/2013 Cath: aneurysmal bleb in the LAD w/ otw nonobs dzs-->Med Rx.   Chronic back pain    Chronic knee pain    a. 09/2015 s/p R TKA.   Chronic pain    Chronic shoulder pain    Chronic sinusitis    COPD (chronic obstructive pulmonary disease) (HCC)    Diabetes mellitus without complication (HCC)    type II    ESRD on peritoneal dialysis (HCC)    on peritoneal dialysis, [facility]   Essential hypertension    GERD (gastroesophageal reflux disease)    Gout    Gout    Hepatomegaly    noted on noncontrast CT 2015   History of hiatal hernia    Hyperlipidemia    Lateral meniscus tear    Obesity    Truncal   Obstructive sleep apnea    does not use cpap    On home oxygen therapy    uses 2l when is going somewhere per patient    PUD (peptic ulcer disease)    remote, reports f/u EGD about 8 years ago unremarkable    Reactive airway disease    related to exposure to chemical during 9/11   Sinusitis    Vitamin D deficiency     Surgical History: Past  Surgical History:  Procedure Laterality Date   A/V FISTULAGRAM N/A 02/28/2023   Procedure: A/V Fistulagram;  Surgeon: Victorino Sparrow, MD;  Location: [facility] INVASIVE CV LAB;  Service: Cardiovascular;  Laterality: N/A;   ASAD LT SHOULDER  12/15/2008   left shoulder   AV FISTULA PLACEMENT Left 08/09/2016   Procedure: BRACHIOCEPHALIC ARTERIOVENOUS (AV) FISTULA CREATION LEFT ARM;  Surgeon: Sherren Kerns, MD;  Location: [facility] OR;  Service: Vascular;  Laterality: Left;   CAPD INSERTION N/A  10/07/2018   Procedure: LAPAROSCOPIC PERITONEAL CATHETER PLACEMENT;  Surgeon: Sheliah Hatch De Blanch, MD;  Location: [facility] ORS;  Service: General;  Laterality: N/A;   CATARACT EXTRACTION W/PHACO Left 03/28/2016   Procedure: CATARACT EXTRACTION PHACO AND INTRAOCULAR LENS PLACEMENT LEFT EYE;  Surgeon: Jethro Bolus, MD;  Location: [facility] ORS;  Service: Ophthalmology;  Laterality: Left;  CDE: 4.77   CATARACT EXTRACTION W/PHACO Right 04/11/2016   Procedure: CATARACT EXTRACTION PHACO AND INTRAOCULAR LENS PLACEMENT RIGHT EYE; CDE:  4.74;  Surgeon: Jethro Bolus, MD;  Location: [facility] ORS;  Service: Ophthalmology;  Laterality: Right;   COLONOSCOPY  10/15/2008   Fields: Rectal polyp obliterated, not retrieved, hemorrhoids, single ascending colon diverticulum near the CV. Next colonoscopy April 2020   COLONOSCOPY N/A 12/25/2014   SLF: 1. Colorectal polyps (2) removed 2. Small internal hemorrhoids 3. the left colon is severely redundant. hyperplastic polyps   CORONARY ARTERY BYPASS GRAFT N/A 06/05/2022   Procedure: OFF PUMP CORONARY ARTERY BYPASS GRAFTING (CABG) X 2 BYPASSES USING LEFT INTERNAL MAMMARY ARTERY AND RIGHT LEG GREATER SAPHENOUS VEIN HARVESTED ENDOSCOPICALLY;  Surgeon: Corliss Skains, MD;  Location: [facility] OR;  Service: Open Heart Surgery;  Laterality: N/A;   CORONARY STENT INTERVENTION N/A 07/25/2021   Procedure: CORONARY STENT INTERVENTION;  Surgeon: Tonny Bollman, MD;  Location: [facility] INVASIVE CV LAB;  Service: Cardiovascular;   Laterality: N/A;   CORONARY STENT INTERVENTION N/A 12/26/2021   Procedure: CORONARY STENT INTERVENTION;  Surgeon: Yvonne Kendall, MD;  Location: [facility] INVASIVE CV LAB;  Service: Cardiovascular;  Laterality: N/A;   CORONARY STENT INTERVENTION N/A 01/20/2022   Procedure: CORONARY STENT INTERVENTION;  Surgeon: Tonny Bollman, MD;  Location: [facility] INVASIVE CV LAB;  Service: Cardiovascular;  Laterality: N/A;   DOPPLER ECHOCARDIOGRAPHY     ESOPHAGOGASTRODUODENOSCOPY N/A 12/25/2014   SLF: 1. Anemia most likely due to CRI, gastritis, gastric polyps 2. Moderate non-erosive gastriits and mild duodenitis.  3.TWo large gstric polyps removed.    EYE SURGERY  12/22/2010   tear duct probing-[place]   FOREIGN BODY REMOVAL  03/29/2011   Procedure: REMOVAL FOREIGN BODY EXTREMITY;  Surgeon: Fuller Canada, MD;  Location: [facility] ORS;  Service: Orthopedics;  Laterality: Right;  Removal Foreign Body Right Thumb   IR FLUORO GUIDE CV LINE RIGHT  08/06/2018   IR FLUORO GUIDE CV LINE RIGHT  02/17/2023   IR US GUIDE VASC ACCESS RIGHT  08/06/2018   IR US GUIDE VASC ACCESS RIGHT  02/17/2023   KNEE ARTHROSCOPY  10/16/2007   left   KNEE ARTHROSCOPY WITH LATERAL MENISECTOMY Right 10/14/2015   Procedure: LEFT KNEE ARTHROSCOPY WITH PARTIAL LATERAL MENISECTOMY;  Surgeon: Vickki Hearing, MD;  Location: [facility] ORS;  Service: Orthopedics;  Laterality: Right;   LEFT HEART CATH AND CORONARY ANGIOGRAPHY N/A 07/25/2021   Procedure: LEFT HEART CATH AND CORONARY ANGIOGRAPHY;  Surgeon: Tonny Bollman, MD;  Location: [facility] INVASIVE CV LAB;  Service: Cardiovascular;  Laterality: N/A;   LEFT HEART CATH AND CORONARY ANGIOGRAPHY N/A 12/26/2021   Procedure: LEFT HEART CATH AND CORONARY ANGIOGRAPHY;  Surgeon: Yvonne Kendall, MD;  Location: [facility] INVASIVE CV LAB;  Service: Cardiovascular;  Laterality: N/A;   LEFT HEART CATH AND CORONARY ANGIOGRAPHY N/A 01/20/2022   Procedure: LEFT HEART CATH AND CORONARY ANGIOGRAPHY;  Surgeon: Tonny Bollman, MD;  Location: [facility]  INVASIVE CV LAB;  Service: Cardiovascular;  Laterality: N/A;   LEFT HEART CATHETERIZATION WITH CORONARY ANGIOGRAM N/A 03/28/2013   Procedure: LEFT HEART CATHETERIZATION WITH CORONARY ANGIOGRAM;  Surgeon: Marykay Lex, MD;  Location: [facility] CATH LAB;  Service: Cardiovascular;  Laterality: N/A;   NM MYOVIEW LTD     PENILE PROSTHESIS IMPLANT N/A 08/16/2015   Procedure: PENILE PROTHESIS INFLATABLE, three piece, Excisional biopsy of Penile ulcer, Penile molding;  Surgeon: Jethro Bolus, MD;  Location: [facility] ORS;  Service: Urology;  Laterality: N/A;   PENILE PROSTHESIS IMPLANT N/A 12/24/2017   Procedure: REMOVAL AND  REPLACEMENT  COLOPLAST PENILE PROSTHESIS;  Surgeon: Crista Elliot, MD;  Location: [facility] ORS;  Service: Urology;  Laterality: N/A;   PERIPHERAL VASCULAR BALLOON ANGIOPLASTY  02/28/2023   Procedure: PERIPHERAL VASCULAR BALLOON ANGIOPLASTY;  Surgeon: Victorino Sparrow, MD;  Location: [facility] INVASIVE CV LAB;  Service: Cardiovascular;;   PORT-A-CATH REMOVAL N/A 02/19/2023   Procedure: REMOVAL OF PERITONEAL DIALYSIS CATHETER;  Surgeon: Victorino Sparrow, MD;  Location: [facility] OR;  Service: Vascular;  Laterality: N/A;   QUADRICEPS TENDON REPAIR  07/21/2011   Procedure: REPAIR QUADRICEP TENDON;  Surgeon: Fuller Canada, MD;  Location: [facility] ORS;  Service: Orthopedics;  Laterality: Right;   RIGHT/LEFT HEART CATH AND CORONARY ANGIOGRAPHY N/A 05/30/2022   Procedure: RIGHT/LEFT HEART CATH AND CORONARY ANGIOGRAPHY;  Surgeon: Corky Crafts, MD;  Location: [facility] INVASIVE CV LAB;  Service: Cardiovascular;  Laterality: N/A;   TEE WITHOUT CARDIOVERSION N/A 06/05/2022   Procedure: TRANSESOPHAGEAL ECHOCARDIOGRAM (TEE);  Surgeon: Corliss Skains, MD;  Location: [facility] OR;  Service: Open Heart Surgery;  Laterality: N/A;   TOENAIL EXCISION     removed x2-bilateral   UMBILICAL HERNIA REPAIR  07/17/2005   [place]    Medications:   Current Facility-Administered Medications:    (feeding supplement) PROSource Plus  liquid 30 mL, 30 mL, Oral, BID BM, Stovall, Kathryn R, PA-C, 30 mL at 04/21/23 0937   0.9 %  sodium chloride infusion, , Intravenous, PRN, Angelina Sheriff, DO, Last Rate: 10 mL/hr at 04/18/23 1515, New Bag at 04/18/23 1515   acetaminophen (TYLENOL) tablet 325-650 mg, 325-650 mg, Oral, Q4H PRN, Milinda Antis, PA-C, 650 mg at 04/12/23 2036   alteplase (CATHFLO ACTIVASE) injection 2 mg, 2 mg, Intracatheter, Once PRN, Penninger, Lillia Abed, PA   anticoagulant sodium citrate solution 5 mL, 5 mL, Intracatheter, PRN, Penninger, Lillia Abed, PA   ARIPiprazole (ABILIFY) tablet 7.5 mg, 7.5 mg, Oral, QHS, Fanny Dance, MD, 7.5 mg at 04/23/23 2225   [START ON 05/02/2023] ascorbic acid (VITAMIN C) tablet 500 mg, 500 mg, Oral, Daily, Angelina Sheriff, DO   aspirin EC tablet 81 mg, 81 mg, Oral, Daily, Milinda Antis, PA-C, 81 mg at 04/24/23 0751   atorvastatin (LIPITOR) tablet 80 mg, 80 mg, Oral, Daily, Milinda Antis, PA-C, 80 mg at 04/24/23 0751   bisacodyl (DULCOLAX) EC tablet 5 mg, 5 mg, Oral, Daily PRN, Valetta Fuller, Lynnell Jude, PA-C   Chlorhexidine Gluconate Cloth 2 % PADS 6 each, 6 each, Topical, Q0600, Penninger, Lillia Abed, PA   Darbepoetin Alfa (ARANESP) injection 150 mcg, 150 mcg, Subcutaneous, Q Sun-1800, Paytes, Austin A, RPH, 150 mcg at 04/22/23 1959   dextrose 5 % 100 mL with ascorbic acid (VITAMIN C) 200 mg infusion, , Intravenous, Q T,Th,Sat-1800, Engler, Morgan C, DO, Last Rate: 100 mL/hr at 04/22/23 0535, Infusion Verify at 04/22/23 0535   divalproex (DEPAKOTE ER) 24 hr tablet 250 mg, 250 mg, Oral, QHS, Mariel Craft, MD, 250 mg at 04/23/23 2122   feeding supplement (ENSURE ENLIVE / ENSURE PLUS) liquid 237 mL, 237 mL, Oral, BID WC, Setzer, Lynnell Jude, PA-C, 237 mL at 04/23/23 0755   Gerhardt's butt cream 1 Application, 1  Application, Topical, QID, Milinda Antis, PA-C, 1 Application at 04/24/23 1129   guaiFENesin-dextromethorphan (ROBITUSSIN DM) 100-10 MG/5ML syrup 10 mL, 10 mL, Oral, Q6H PRN,  Milinda Antis, PA-C, 10 mL at 04/03/23 1633   heparin injection 1,000 Units, 1,000 Units, Intracatheter, PRN, Penninger, Lillia Abed, PA   hydrOXYzine (ATARAX) tablet 10 mg, 10 mg, Oral, TID PRN, Elijah Birk C, DO, 10 mg at 04/24/23 1129   lidocaine (LIDODERM) 5 % 1 patch, 1 patch, Transdermal, Q2200, Setzer, Lynnell Jude, PA-C, 1 patch at 04/12/23 2035   lidocaine (LIDODERM) 5 % 1 patch, 1 patch, Transdermal, Q24H, Skip Mayer A, MD, 1 patch at 04/15/23 2000   lidocaine (PF) (XYLOCAINE) 1 % injection 5 mL, 5 mL, Intradermal, PRN, Penninger, Lillia Abed, PA   lidocaine-prilocaine (EMLA) cream 1 Application, 1 Application, Topical, PRN, Penninger, Lillia Abed, PA   lidocaine-prilocaine (EMLA) cream, , Topical, Once, [place], Samantha G, PA-C   LORazepam (ATIVAN) tablet 1 mg, 1 mg, Oral, Q T,Th,Sa-HD, Mariel Craft, MD, 1 mg at 04/24/23 1254   mometasone-formoterol (DULERA) 200-5 MCG/ACT inhaler 2 puff, 2 puff, Inhalation, BID, Setzer, Lynnell Jude, PA-C, 2 puff at 04/24/23 0754   Muscle Rub CREA, , Topical, PRN, Fanny Dance, MD   ondansetron [facility]) tablet 4 mg, 4 mg, Oral, Q6H PRN, 4 mg at 03/16/23 2154 **OR** ondansetron (ZOFRAN) injection 4 mg, 4 mg, Intravenous, Q6H PRN, Setzer, Lynnell Jude, PA-C, 4 mg at 04/03/23 1603   oxyCODONE (Oxy IR/ROXICODONE) immediate release tablet 2.5 mg, 2.5 mg, Oral, TID PRN, Elijah Birk C, DO, 2.5 mg at 04/22/23 0015   pantoprazole (PROTONIX) EC tablet 40 mg, 40 mg, Oral, BID, Setzer, Lynnell Jude, PA-C, 40 mg at 04/24/23 0751   pentafluoroprop-tetrafluoroeth (GEBAUERS) aerosol 1 Application, 1 Application, Topical, PRN, Penninger, Lindsay, PA   traZODone (DESYREL) tablet 50 mg, 50 mg, Oral, QHS PRN, Ranelle Oyster, MD, 50 mg at 04/22/23 0015   traZODone (DESYREL) tablet 50 mg, 50 mg, Oral, QHS, Ranelle Oyster, MD, 50 mg at 04/23/23 2122  Allergies: Allergies  Allergen Reactions   Opana [Oxymorphone Hcl] Itching   Tramadol Itching

## 2023-04-24 NOTE — Progress Notes (Signed)
   04/24/23 1100  Spiritual Encounters  Type of Visit Initial  Care provided to: Patient  Conversation partners present during encounter Nurse  Referral source Chaplain assessment  Reason for visit Routine spiritual support  OnCall Visit No  Spiritual Framework  Presenting Themes Meaning/purpose/sources of inspiration  Values/beliefs family  Interventions  Spiritual Care Interventions Made Compassionate presence;Reflective listening;Normalization of emotions;Meaning making  Intervention Outcomes  Outcomes Connection to spiritual care;Awareness of support;Awareness of health   Ch met pt while doing round. Pt said that he was pretty shaken by the previous heart attack. He thought he was going to die. Ch helped pt process his thought and feelings and demonstrated care and concern. No follow-up needed at this time.

## 2023-04-24 NOTE — Plan of Care (Signed)
  Problem: RH Attention Goal: LTG Patient will demonstrate this level of attention during functional activites (SLP) Description: LTG:  Patient will will demonstrate this level of attention during functional activites (SLP) Flowsheets (Taken 04/24/2023 [phone]) Patient will demonstrate during cognitive/linguistic activities the attention type of: Sustained Patient will demonstrate this level of attention during cognitive/linguistic activities in: Controlled LTG: Patient will demonstrate this level of attention during cognitive/linguistic activities with assistance of (SLP): Moderate Assistance - Patient 50 - 74% Number of minutes patient will demonstrate attention during cognitive/linguistic activities: 10 mins

## 2023-04-24 NOTE — Progress Notes (Signed)
Physical Therapy Note  Patient Details  Name: [name] [name] MRN: [id number] Date of Birth: [birth year] Today's Date: 04/24/2023   Therapist enters room at scheduled appointment time however pt unable due to off unit HD appointment. Will reattempt as schedule allows.   Edwin Cap 04/24/2023, 2:04 PM

## 2023-04-24 NOTE — Progress Notes (Signed)
Occupational Therapy Session Note  Patient Details  Name: [name] [name] MRN: [id number] Date of Birth: [birth year]  Today's Date: 04/24/2023 OT Individual Time: 0950-1030 OT Individual Time Calculation (min): 40 min    Short Term Goals: Week 6:   LTG=STG 2/2 ELOS  Skilled Therapeutic Interventions/Progress Updates:    Pt greeted seated at nurses station. Pleasant and cooperative today throughout entire OT session. Pt conversational and easy to redirect, pt even laughing at times. Pt brought to therapy gym and completed UB there-ex using Sci[employer] arm bike with pt able to attend to task for 2, 2 minute intervals and min cues. Pt completed 3 sets of 10 chest press and bicep curl using 3 lb bar with min/mod cues. Sit<>stands in gym with min A and cues for initiation 3 sets of 5. Pt returned to nurses station and left with alarm belt on, call bell in reach and needs met.   Therapy Documentation Precautions:  Precautions Precautions: Fall Restrictions Weight Bearing Restrictions: No Pain:  Denies pain   Therapy/Group: Individual Therapy  [name] 04/24/2023, 10:23 AM

## 2023-04-24 NOTE — Progress Notes (Signed)
[facility] Progress Note   Subjective:   Patient seen and examined at nurses station. Calmly sitting in wheelchair.  No specific complaints. Denies CP, SOB, abdominal pain.  Admits to wheezing. More cooperative with CIR therapies this week. Per CIR NP patient is nearing the end of his time in CIR.  Objective Vitals:   04/23/23 1410 04/23/23 2016 04/23/23 2016 04/24/23 0410  BP: 104/68 (!) 147/98 (!) 147/98 (!) 147/80  Pulse: 76 62 62 78  Resp: 17 18 18 17   Temp: 98.1 F (36.7 C) 98 F (36.7 C)    TempSrc: Oral Oral    SpO2: 99% 97% 97% 97%  Weight:      Height:       Physical Exam General:alert male in NAD, sitting in wheelchair Heart:RRR, no mrg Lungs:+wheezing on L, +crackles in base on R Abdomen:soft, NTND Extremities:trace LE edema Dialysis Access: LU AVF +b/t   Filed Weights   04/21/23 1337  Weight: 94.3 kg    Intake/Output Summary (Last 24 hours) at 04/24/2023 1303 Last data filed at 04/23/2023 1834 Gross per 24 hour  Intake 237 ml  Output --  Net 237 ml    Additional Objective Labs: Basic Metabolic Panel: Recent Labs  Lab 04/18/23 1001  NA 134*  K 3.9  CL 94*  CO2 25  GLUCOSE 118*  BUN 36*  CREATININE 9.50*  CALCIUM 9.2   Liver Function Tests: Recent Labs  Lab 04/18/23 1001  AST 30  ALT 21  ALKPHOS 78  BILITOT 0.8  PROT 6.9  ALBUMIN 2.5*   CBC: Recent Labs  Lab 04/18/23 1001  WBC 4.3  NEUTROABS 3.0  HGB 10.1*  HCT 33.6*  MCV 89.6  PLT 220   Blood Culture    Component Value Date/Time   SDES URINE, CATHETERIZED 04/08/2023 0915   SPECREQUEST  04/08/2023 0915    NONE Performed at [facility], [address], [place], [place] [address]    CULT >=100,000 COLONIES/mL STAPHYLOCOCCUS HAEMOLYTICUS (A) 04/08/2023 0915   REPTSTATUS 04/10/2023 FINAL 04/08/2023 0915    Medications:  sodium chloride 10 mL/hr at 04/18/23 1515   dextrose 5 % 100 mL with ascorbic acid (VITAMIN C) 200 mg infusion 100 mL/hr at  04/22/23 0535    (feeding supplement) PROSource Plus  30 mL Oral BID BM   ARIPiprazole  7.5 mg Oral QHS   [START ON 05/02/2023] ascorbic acid  500 mg Oral Daily   aspirin EC  81 mg Oral Daily   atorvastatin  80 mg Oral Daily   Chlorhexidine Gluconate Cloth  6 each Topical Q0600   darbepoetin (ARANESP) injection - DIALYSIS  150 mcg Subcutaneous Q Sun-1800   divalproex  250 mg Oral QHS   feeding supplement  237 mL Oral BID WC   Gerhardt's butt cream  1 Application Topical QID   lidocaine  1 patch Transdermal Q2200   lidocaine  1 patch Transdermal Q24H   lidocaine-prilocaine   Topical Once   LORazepam  1 mg Oral Q T,Th,Sa-HD   mometasone-formoterol  2 puff Inhalation BID   pantoprazole  40 mg Oral BID   traZODone  50 mg Oral QHS    Dialysis Orders: Previously on [facility] thru [facility] - changed to HD - will need outpatient HD unit established prior to d/c. 4hr, 3K, EDW ~96, L AVF    Assessment/Plan: 1. Debility - in CIR. Appropriateness for CIR nearing its end per NP.   2. Encephalopathy/agitation: Waxing/waning agitation, dementia, sundowning. No  acute findings on imaging. Neruo and Psych consulting to help improve behavior.  He has been disruptive during HD.  Did not improve with wife at bedside. Patient requires one on one HD for his safety, which is not possible in outpatient setting.  Use of IV Ativan can not be done in OP HD units. Requesting detailed notes of any behavior problems during treatments.  IR will not place catheter, agitation is not an indication to place catheter.  Considered switching to PD so patient can complete dialysis at home, but vascular surgery felt he was high risk for PD catheter placement.  Meds being adjusted per primary team/psychiatry team.   3. ESRD: Transitioned from PD to HD recently on TTS schedule. Using old AVF which is still functional. Family would prefer TTS HD. Coordinate with rehab team so they know to premedicate before HD, but he is not  always willing to take his medications. Using emla cream to decrease pain with cannulation.  4.Hypertension/volume: BP in goal. Midodrine 5mg  stopped. Crackles on exam, increase UF with HD today.  5. Anemia: Hgb 10.1-on Aranesp weekly. Last Tsat 38% with ferritin 2175.  No iron.  6. Metabolic bone disease: CorrCa elevated , Phos acceptable. No VDRA. Needs updated labs with next HD 7.  Nutrition:  Alb 2.5, continue supps.  Renal diet w/fluid restrictions.  8.  Hx NSVT: On Coreg 10.  Recent culture negative PD cath-related peritonitis and associated fungemia + enterococcus bacteremia - underwent PD cath removal, TDC placement and removal, temp HD cath placement  and removal, and line holiday. Completed IV unasyn, IV micafungin and cipro on 8/15. TDC not replaced cause he had functional AVF 11. Dispo - ongoing issues with safety during HD making patient unable to discharge.  He is not a candidate for LTAC per SW. Unfortunately our options are limited to essentially hospice or waiting to see if mental status improves. His wife does not want him to go to hospice. Looking at possible out of state facility if behavior not improved.     Virgina Norfolk, PA-C [facility] 04/24/2023,1:03 PM  LOS: 43 days

## 2023-04-24 NOTE — Plan of Care (Signed)
  Problem: RH Memory Goal: LTG Patient will demonstrate ability for day to day (SLP) Description: LTG:   Patient will demonstrate ability for day to day recall/carryover during cognitive/linguistic activities with assist  (SLP) Outcome: Not Applicable Note: Goal discharged as it is no longer appropriate due to minimal progress   Problem: RH Awareness Goal: LTG: Patient will demonstrate awareness during functional activites type of (SLP) Description: LTG: Patient will demonstrate awareness during functional activites type of (SLP) Outcome: Not Applicable Note: Goal discharged as it is no longer appropriate due to minimal progress

## 2023-04-25 LAB — VITAMIN B12: Vitamin B-12: 629 pg/mL (ref 180–914)

## 2023-04-25 LAB — FOLATE: Folate: 7.5 ng/mL (ref >5.9)

## 2023-04-25 LAB — TSH: TSH: 1.357 u[IU]/mL (ref 0.350–4.500)

## 2023-04-25 MED ORDER — CHLORHEXIDINE GLUCONATE CLOTH 2 % EX PADS: 6.0000 | MEDICATED_PAD | Freq: Every day | CUTANEOUS | Status: DC

## 2023-04-25 NOTE — Consult Note (Addendum)
Brief Psychiatry Consult Note  The patient was last seen by the psychiatry service on 10/9. Interim documentation by primary team and nursing staff has been reviewed. Had gone to see pt bc of new report that he was agitated during overnight neuro eval (took place at some point between 2 and 7 AM). He was sound asleep, but speaking to wife at bedside he had "a really good day" and seemed more like himself. She is hoping he can come home soon. She mentioned he has been much more mobile since vitamin C (had some parkinsonian features prior to this).  Opted to let pt sleep. Do not think that an overnight episode of agitation constitutes reason to assume trial off depakote failed especially as he has had a good day by wife's report and on review of therapy notes. Will recommend he be seen 10/11 and psych will sign off at that time if ongoing improvement (which was original plan from yesterday). This has been discussed with wife and primary team.    -- to be seen x1 later this week; hopefully has improved to the point we can sign off.  -- personally feel benefit of hydroxyzine >>> risk, although reasonable to hold if he is no longer itching during dialysis as ativan more important med overall.

## 2023-04-25 NOTE — Progress Notes (Signed)
Speech Language Pathology Daily Session Note  Patient Details  Name: [name] [name] MRN: [id number] Date of Birth: August 03, 1950  Today's Date: 04/25/2023 SLP Individual Time: 3086-5784 SLP Individual Time Calculation (min): 25 min  Short Term Goals: Week 7: SLP Short Term Goal 1 (Week 7): Patient will utilize external aids to orient to time and situation given modA SLP Short Term Goal 2 (Week 7): Pt will complete functional problem solving with 60% acc with max A. SLP Short Term Goal 3 (Week 7): Pt will sustain attention to functional task for 10 minutes given mod multimodal cues. SLP Short Term Goal 4 (Week 7): Pt will answer biographical questions with 90% accuracy with mod A multimodal cues.  Skilled Therapeutic Interventions: SLP conducted skilled therapy session targeting cognitive retraining goals. Patient requested SLP assistance with calling wife to request items for her to bring to the hospital. Patient unable to accurately recall wife's number and demonstrated awareness that number stated was incorrect. SLP located correct number and dialed for patient with patient requiring min verbal cues to place phone to ear to participate in conversation. Patient demonstrated significantly impaired awareness of overall situation during phone call. SLP and patient discussed orientation to date, time, location, and situation with patient improving in this area, requiring overall mod-maxA to orient to date and modA to orient to situation. Patient oriented to city (!!!) with supervisionA. Patient answered basic biographical questions with 80% accuracy given modA and sustained attention during conversation for 10+ minutes given modA. Patient was left in chair with call bell in reach and chair alarm set. SLP will continue to target goals per plan of care.       Pain Pain Assessment Pain Scale: 0-10 Pain Score: 0-No pain  Therapy/Group: Individual Therapy  [name], M.A., CCC-SLP  [name] 04/25/2023, 11:19 AM

## 2023-04-25 NOTE — Progress Notes (Signed)
Occupational Therapy Session Note  Patient Details  Name: [name] [name] MRN: [id number] Date of Birth: [birth year]  Today's Date: 04/25/2023 OT Individual Time: 1130-1200 OT Individual Time Calculation (min): 30 min    Skilled Therapeutic Interventions/Progress Updates:    1:1 Pt received in the w/c sitting in his room with the sun shining on him. Pt very pleasant and ready to participate with therapist. Pt's wife shortly arrived to assist him with showering. Pt participated with picking out clean clothing from closet. Pt oriented to hospital and that he didn't have to wear more formal clothing. Pt engaged in eating [employer] that his wife brought- feeding himself- tremors still present more significant with increased degrees of freedom. Engaged in biographical information and with min cuing pt oriented to familiar information. After eating ambulated into the bathroom with min A with A to steer RW and cues for remaining in the RW and to maintain balance. Wife doffed clothing. Pt turned with min A to tub bench and left with wife to bathe.  She has been showering and performed stand step transfers with him and can demonstrate safety with his care in in the room with setup.   Therapy Documentation Precautions:  Precautions Precautions: Fall Restrictions Weight Bearing Restrictions: No  Pain: Pain Assessment Pain Scale: 0-10 Pain Score: 0-No pain   Therapy/Group: Individual Therapy  [name] [facility] 04/25/2023, 1:52 PM

## 2023-04-25 NOTE — Progress Notes (Signed)
NEUROLOGY CONSULT FOLLOW UP NOTE   Date of service: April 25, 2023 Patient Name: [name] [name] MRN:  [id number] DOB:  1950/12/20  Brief HPI  [name] [name] is a 72 y.o. male  has a past medical history of Anemia, Arthritis, Asthma, Bell palsy, CAD (coronary artery disease), Chronic back pain, Chronic knee pain, Chronic pain, Chronic shoulder pain, Chronic sinusitis, COPD (chronic obstructive pulmonary disease) (HCC), Diabetes mellitus without complication (HCC), ESRD on peritoneal dialysis (HCC), Essential hypertension, GERD (gastroesophageal reflux disease), Gout, Gout, Hepatomegaly, History of hiatal hernia, Hyperlipidemia, Lateral meniscus tear, Obesity, Obstructive sleep apnea, On home oxygen therapy, PUD (peptic ulcer disease), Reactive airway disease, Sinusitis, and Vitamin D deficiency.  As well as recent COVID infection in late July 2024  Patient was admitted on 02/15/2023 for confusion and shortness of breath for several days, found to have sepsis secondary to polymicrobial infection (Candida, Enterococcus, Staphylococcus hemolyticus, treated with Unasyn and micafungin for 14-day course).  He was initially evaluated as a code stroke by neurology on 8/4 for worsening tremor in the setting of dialysis.  Hospital course has been complicated by delirium and persistent encephalopathy.  Neurology is asked to reengage given that his agitation is limiting dialysis options (he is requiring one-to-one sitter for dialysis and has also been receiving Ativan and Atarax for dialysis, the latter to treat both anxiety as well as his complaints of itching during dialysis).  He was discharged to rehab 8/16 on Seroquel 50 mg 3 times daily which had been weaned due to concern it was not having any benefit Subsequently had been on clonazepam 0.5 mg 3 times daily transitioned to Ativan 0.5 mg daily p.o. or IV as needed for anxiety/agitation.  Primary team notes that sleep/confusion and agitation are correlated with  the receiving as needed antipsychotics and Ativan Other psychiatric medications that have been trialed include ziprasidone (discontinued due to bradycardia), Depakote (discontinued due to no clear benefit) Memantine was trialed from 9/24 through 10/3 but disc continue due to concern for increasing confusion/lethargy Abilify was started on 9/17 and increased to 7.5 mg as of 9/27; per psych notes this has been the most helpful medication He has not been on his home gabapentin Wife cannot recall that any specific medication has been more helpful or more problematic   Psychiatry has been following the patient intermittently, first evaluating the patient on 9/18, and there was concern for hypoactive delirium with potentially underlying dementia.    Of note he was evaluated by Dr. Terrace [name] on 09/11/2022 with complaints of progressive gait abnormality, memory loss, and left hand tremor since his open heart surgery.  At the time his MoCA score was 21 with 3 points lost on visuospatial/executive function, 1 point lost on attention, 2 points lost on repetition and fluency, 2 points lost on delayed recall and 1 point lost on orientation. Wife does not recall him having any memory issues at the time of this evaluation  Overall per notes his mental status has gradually improved although he continues to have significant fluctuation, poor insight and in particular agitation during dialysis that is limiting his safe discharge options  Wife reports during dialysis he gets agitated, talks out of his head, calls people names for help that have passed. He had chronic day night reversal wife reports due to history of working as a [employer]  Inconsistent use of CPAP, maybe 3 - 4x per week on average, sleeps better with it, sleeps better with nose pillows than full face mask; wife  reports she was told that the patient can't use his home CPAP here and therefore has not been using it.   Wife notes he used to be overly sedated,  but more alert / awake in the last 2 weeks. Wife reports he does well with dialysis if she is there and if they apply numbing cream prior to needle insertion, though he does need frequent redirection even during the dialysis.  Per other notes even with wife in the room during dialysis outpatient dialysis would not be felt to be sustainable   Interval Hx/subjective   -Irritable, yells at examiner, does not answer any questions  Vitals   Current vital signs: BP 136/79 (BP Location: Right Arm)   Pulse 67   Temp 97.9 F (36.6 C) (Oral)   Resp 18   Ht 5\' 4"  (1.626 m)   Wt 88.4 kg   SpO2 97%   BMI 33.45 kg/m  Vital signs in last 24 hours: Temp:  [97.5 F (36.4 C)-98.8 F (37.1 C)] 97.9 F (36.6 C) (10/08 1954) Pulse Rate:  [67-98] 67 (10/08 1954) Resp:  [17-27] 18 (10/08 1954) BP: (122-148)/(67-91) 136/79 (10/08 1954) SpO2:  [93 %-98 %] 97 % (10/08 1954) Weight:  [88.4 kg-91.4 kg] 88.4 kg (10/08 1738)  Body mass index is 33.45 kg/m.  Physical Exam   Constitutional: Sleeping comfortably in the bed, snoring Psych: Irritable when awoken, quickly agitated and readily swings at examiner Eyes: No scleral injection.  HENT: No OP obstrucion.  Head: Normocephalic.  Cardiovascular: Perfusing extremities well  Respiratory: Effort normal, non-labored breathing GI: Soft.  No distension. There is no tenderness.  Skin: WDI.   Neurologic Examination   Mental status: Very sleepy, falls asleep repeatedly during examination.  Does not follow any commands.  Does not answer any questions.  Exam highly limited by patient irritability/readiness to swing at examiner Cranial nerves: Could not examine pupils as patient rapidly closes eyes.  Did briefly orient to examiner bilaterally.  Face symmetric Motor: Using all 4 extremities grossly equally. Sensory: Equally reactive to light tickle in all 4 extremities    Labs and Diagnostic Imaging   CBC:  Recent Labs  Lab 04/18/23 1001  04/24/23 1342  WBC 4.3 4.2  NEUTROABS 3.0  --   HGB 10.1* 10.7*  HCT 33.6* 34.3*  MCV 89.6 89.8  PLT 220 170    Basic Metabolic Panel:  Lab Results  Component Value Date   NA 133 (L) 04/24/2023   K 4.1 04/24/2023   CO2 23 04/24/2023   GLUCOSE 129 (H) 04/24/2023   BUN 60 (H) 04/24/2023   CREATININE 10.93 (H) 04/24/2023   CALCIUM 9.2 04/24/2023   GFRNONAA 5 (L) 04/24/2023   GFRAA <4 (L) 02/09/2020   Lipid Panel:  Lab Results  Component Value Date   LDLCALC 36 06/03/2022   HgbA1c:  Lab Results  Component Value Date   HGBA1C 6.3 (H) 03/04/2023   Urine Drug Screen:     Component Value Date/Time   LABOPIA NONE DETECTED 08/29/2018 1242   COCAINSCRNUR POSITIVE (A) 08/29/2018 1242   LABBENZ NONE DETECTED 08/29/2018 1242   AMPHETMU NONE DETECTED 08/29/2018 1242   THCU NONE DETECTED 08/29/2018 1242   LABBARB NONE DETECTED 08/29/2018 1242    Alcohol Level No results found for: "ETH" INR  Lab Results  Component Value Date   INR 1.0 03/08/2023   APTT  Lab Results  Component Value Date   APTT 41 (H) 03/08/2023   AED levels:  Latest Reference Range &  Units 04/06/23 13:30 04/18/23 10:01 04/24/23 05:28  Valproic Acid,S 50.0 - 100.0 ug/mL 16 (L) 18 (L) <10 (L)  (L): Data is abnormally low  09/11/2022 B12 level was 688 RPR low titer 1:1 with negative T palladium antibodies TSH 1.860  MRI Brain(Personally reviewed): 1. No acute intracranial abnormality. 2. Moderate for age white matter signal changes with occasional chronic microhemorrhages, most commonly due to chronic small vessel disease.  rEEG:  8/28 -- intermittent slow, generalized 8/4 -- continuous slow, generalized  Impression  Mixed hypoactive/hyperactive delirium given waxing and waning mental status, intermittent agitation.  While chart review supports a more long-term decline interestingly wife was not aware of any prior memory issues.   Patient does seem to have improved with medications adjustments  made gradually by primary team and psychiatry, although unfortunately his care is still extremely limited by his agitation.  May continue to gradually improve with adjustments; some specific suggestions below might be trialed.  Defer to primary team and psychiatry on making any adjustments as they may have a better sense of which medications have been most or least helpful to the patient given they have been following more closely than neurology.  Consider obtaining additional collateral about patient's baseline functional status from other family members such as daughter, as wife did not seem fully aware of prior cognitive difficulties as documented in outpatient neurology notes  Recommendations  -Patient may benefit from use of CPAP if home CPAP could be accommodated as he does not tolerate regular CPAP -Caution with Atarax and benzodiazepines as these can worsen delirium, consider discontinuing Atarax if patient is not continuing to complain of itchiness with dialysis -Agree with topical creams to minimize pain -Consider reinstitution of home gabapentin as this can have mood stabilization effects -Outpatient neurocognitive testing if patient improves enough to cooperate with this -Inpatient neurology will sign off at this time, please reach out with any questions or concerns ______________________________________________________________________   Thank you for the opportunity to take part in the care of this patient. If you have any further questions, please contact the neurology consultation team on call. Updated oncall schedule is listed on AMION.  Signed,  Sheffield Hawker L Lamika Connolly  Greater than 50 minutes spent in care of this patient, majority at bedside or in discussion with family via phone given patient's inability to provide history

## 2023-04-25 NOTE — Progress Notes (Signed)
Physical Therapy Session Note  Patient Details  Name: [name] [name] MRN: [id number] Date of Birth: 1950-11-04  Today's Date: 04/25/2023 PT Individual Time: 8295-6213 PT Individual Time Calculation (min): 23 min   Short Term Goals: Week 6:  PT Short Term Goal 1 (Week 6): To continue to consistently meet LTG  Skilled Therapeutic Interventions/Progress Updates:    Pt presents in room in sidelying, respiratory therapist present, pt agreeable to mobility. Pt reporting pain in ankle and demonstrates pain avoidant behaviors (yelling, flexor withdraw) with tactile stimulation of anterior ankle and attempt to don shoe. Session focused on comfort measures and following commands due to pt with increased confusion this session. Pt completes bed mobility supine to long sitting with supervision however does not follow cues for bringing BLEs off EOB and scooting towards EOB, requiring max assist for this and significant increase in time to attempt to facilitate participation. Pt agreeable to attempt stand with pt requiring max assist for stand due to decreased initiation and attention. RN then present, pt completes stand pivot transfer with RW with mod assist from therapist, min assist from RN and max verbal cues and increased time with pt attempting to fold walker during transfer. Pt denies pain during and after transfer. Once seated in WC pt falling asleep, will awaken to answer questions but then quickly returns to sleeping. Pt remains seated in WC with all needs within reach, chair alarm donned and activated, and pt set up for breakfast. NT notified of pt position and telesitter in place.  Therapy Documentation Precautions:  Precautions Precautions: Fall Restrictions Weight Bearing Restrictions: No  Therapy/Group: Individual Therapy  Edwin Cap PT, DPT 04/25/2023, 9:12 AM

## 2023-04-25 NOTE — Patient Care Conference (Signed)
Inpatient RehabilitationTeam Conference and Plan of Care Update Date: 04/24/2023   Time: 10:39 AM   Patient Name: [name] [name]      Medical Record Number: [id number]  Date of Birth: 12-12-1950 Sex: Male         Room/Bed: 4W13C/4W13C-01 Payor Info: Payor: MEDICARE / Plan: MEDICARE PART A AND B / Product Type: *No Product type* /    Admit Date/Time:  03/12/2023  4:25 PM  Primary Diagnosis:  Acute metabolic encephalopathy  Hospital Problems: Principal Problem:   Acute metabolic encephalopathy Active Problems:   Type 2 diabetes mellitus with chronic kidney disease on chronic dialysis, with long-term current use of insulin (HCC)   HFrEF (heart failure with reduced ejection fraction) (HCC)   Morbid obesity (HCC)   Encephalopathy   Epistaxis   PAF (paroxysmal atrial fibrillation) (HCC)   Long term (current) use of insulin (HCC)   ESRD on dialysis (HCC)   Debility   Agitation   Hematuria    Expected Discharge Date: Expected Discharge Date:  (SNF pending)  Team Members Present: Physician leading conference: Dr. Elijah Birk Social Worker Present: Cecile Sheerer, LCSWA Nurse Present: Other (comment) Konrad Dolores RN) PT Present: Darrold Span, PT OT Present: Kearney Hard, OT SLP Present: Feliberto Gottron, SLP PPS Coordinator present : Fae Pippin, SLP     Current Status/Progress Goal Weekly Team Focus  Bowel/Bladder   Pt is continent of b/b. Oliguric d/t Dialysis. LBM: 10/6   Remain continent of b/b.   Assist w/ toileting needs when needed.    Swallow/Nutrition/ Hydration               ADL's   Improved agitation today, mod A overall   mod A overall   Barriers are agitation and behaviors, poor safety awareness, self-care retraining    Mobility   minA overall, gait with RW 90' improved lethargy and agitation   minA ambulatory household distances, WC for community, caregiver goals  barriers: increased agitation and lethargy, focus on gait training, global  strengthening and endurance, following verbal cues    Communication                Safety/Cognition/ Behavioral Observations  Max A for basic cognition   Mod A for orientation and sustained attention   orientation with use of adis, sustained attention, basic safety awareness/problem solving    Pain   Pt denies pain.   Remain pain free.   Assess pain q shift & PRN.    Skin   Skin is intact.   Maintain skin integrity.  Assess skin q shift & PRN.      Discharge Planning:  D/c likely to home due to concerns related to switch back to PD from HD due to behaviors during HD and no outpatient HD clinic willing to accept at this time. SW waiting on confirmation from nephrology about options. Wife is willing to take pt home if this is the only option. SW will confirm there are no barriers to discharge.   Team Discussion: Patient continues to display agitation and confusion at times with scheduled ativan pre-HD however has denied need for pain medication over the past week. Continue to note poor memory and gradual forward trajectory with medication adjustments. Discussion with wife on discharge options given OP is not an option at this time and hesitancy to resume PD due to infection risk.   Patient on target to meet rehab goals: Currently able to ambulate up to 76' with min assist when cooperative and  participating with therapy sessions.   *See Care Plan and progress notes for long and short-term goals.   Revisions to Treatment Plan:  Depakote taper Hospice declined Palliative signed off   Teaching Needs: Safety, medications, transfers, toileting, etc.   Current Barriers to Discharge: Decreased caregiver support, Hemodialysis, and Behavior  Possible Resolutions to Barriers: SNF recommended     Medical Summary Current Status: medically complicated by metabolic encephalopathy, delirium, aggitation, insomnia, ESRD on HD, hypertension, intermittent incontinence, and  debility/weakness  Barriers to Discharge: Behavior/Mood;Electrolyte abnormality;Incontinence;Medical stability;Morbid Obesity;Renal Insufficiency/Failure;Pending surgery/plan;Self-care education;Uncontrolled Hypertension  Barriers to Discharge Comments: ongoing encephalopathy/delirium with aggitation complicating HD, not condidate for PD conversion; intermitent hypertension Possible Resolutions to [employer] Focus: psych assistance in titrating medications for delirium and aggitation, displo planning with wife given unable to convert to PD and behaviors making patient not a candidate for OP HD   Continued Need for Acute Rehabilitation Level of Care: The patient requires daily medical management by a physician with specialized training in physical medicine and rehabilitation for the following reasons: Direction of a multidisciplinary physical rehabilitation program to maximize functional independence : Yes Medical management of patient stability for increased activity during participation in an intensive rehabilitation regime.: Yes Analysis of laboratory values and/or radiology reports with any subsequent need for medication adjustment and/or medical intervention. : Yes   I attest that I was present, lead the team conference, and concur with the assessment and plan of the team.   Chana Bode B 04/25/2023, 8:23 PM

## 2023-04-25 NOTE — Progress Notes (Signed)
[facility] Progress Note   Subjective:   Patient seen and examined in room.  Sitting up about to eat breakfast.  No reported issues with HD yesterday. Tolerated well per patient.  Denies CP, SOB, abdominal pain and n/v/d.  Objective Vitals:   04/24/23 1738 04/24/23 1954 04/25/23 0615 04/25/23 0852  BP:  136/79 102/81   Pulse:  67 63   Resp:  18 18   Temp:  97.9 F (36.6 C) 97.7 F (36.5 C)   TempSrc:  Oral    SpO2:  97% 100% 94%  Weight: 88.4 kg     Height:       Physical Exam General:chronically ill appearing sleepy male in NAD Heart:RRR, no mrg Lungs:+crackles at bases Abdomen:soft, NTND Extremities:trace LE edema Dialysis Access: LU AVF   Filed Weights   04/21/23 1337 04/24/23 1327 04/24/23 1738  Weight: 94.3 kg 91.4 kg 88.4 kg    Intake/Output Summary (Last 24 hours) at 04/25/2023 1313 Last data filed at 04/24/2023 1711 Gross per 24 hour  Intake --  Output 3000 ml  Net -3000 ml    Additional Objective Labs: Basic Metabolic Panel: Recent Labs  Lab 04/24/23 1342  NA 133*  K 4.1  CL 94*  CO2 23  GLUCOSE 129*  BUN 60*  CREATININE 10.93*  CALCIUM 9.2  PHOS 4.9*   Liver Function Tests: Recent Labs  Lab 04/24/23 1342  ALBUMIN 2.6*   CBC: Recent Labs  Lab 04/24/23 1342  WBC 4.2  HGB 10.7*  HCT 34.3*  MCV 89.8  PLT 170   Medications:  sodium chloride 10 mL/hr at 04/18/23 1515   dextrose 5 % 100 mL with ascorbic acid (VITAMIN C) 200 mg infusion 100 mL/hr at 04/22/23 0535    (feeding supplement) PROSource Plus  30 mL Oral BID BM   ARIPiprazole  7.5 mg Oral QHS   [START ON 05/02/2023] ascorbic acid  500 mg Oral Daily   aspirin EC  81 mg Oral Daily   atorvastatin  80 mg Oral Daily   Chlorhexidine Gluconate Cloth  6 each Topical Q0600   darbepoetin (ARANESP) injection - DIALYSIS  150 mcg Subcutaneous Q Sun-1800   feeding supplement  237 mL Oral BID WC   Gerhardt's butt cream  1 Application Topical QID   lidocaine  1 patch  Transdermal Q2200   lidocaine  1 patch Transdermal Q24H   lidocaine-prilocaine   Topical Once   LORazepam  1 mg Oral Q T,Th,Sa-HD   mometasone-formoterol  2 puff Inhalation BID   pantoprazole  40 mg Oral BID   traZODone  50 mg Oral QHS    Dialysis Orders: Previously on [facility] thru [facility] - changed to HD - will need outpatient HD unit established prior to d/c. 4hr, 3K, EDW ~96, L AVF    Assessment/Plan: 1. Debility - in CIR. Appropriateness for CIR nearing its end per NP.   2. Encephalopathy/agitation: Waxing/waning agitation, dementia, sundowning. No acute findings on imaging. Neruo and Psych consulting to help improve behavior.  No behavior issues reported with HD yesterday. Requesting better documentation on behavior with HD.  He has been disruptive during HD.  Did not improve with wife at bedside. Patient requires one on one HD for his safety, which is not possible in outpatient setting.  Use of IV Ativan can not be done in OP HD units.   IR will not place catheter, agitation is not an indication to place catheter.  Considered switching to PD so patient can  complete dialysis at home, but vascular surgery felt he was high risk for PD catheter placement.  Meds being adjusted per primary team/psychiatry team.   3. ESRD: Transitioned from PD to HD recently on TTS schedule. Using old AVF which is still functional. Family would prefer TTS HD. Coordinate with rehab team so they know to premedicate before HD, but he is not always willing to take his medications. Using emla cream to decrease pain with cannulation.  4.Hypertension/volume: BP in goal. Midodrine 5mg  stopped. Crackles on exam, increase UF with HD today.  5. Anemia: Hgb 10.7-on Aranesp weekly. Last Tsat 38% with ferritin 2175.  No iron.  6. Metabolic bone disease: CorrCa and phos in goal. No VDRA.  7.  Nutrition:  Alb 2.6, continue supps.  Renal diet w/fluid restrictions.  8.  Hx NSVT: On Coreg 10.  Recent culture negative  PD cath-related peritonitis and associated fungemia + enterococcus bacteremia - underwent PD cath removal, TDC placement and removal, temp HD cath placement  and removal, and line holiday. Completed IV unasyn, IV micafungin and cipro on 8/15. TDC not replaced cause he had functional AVF 11. Dispo - ongoing issues with safety during HD making patient unable to discharge. Will need consistent safe behavior during HD to be candidate for outpatient.  Will also need to ensure able to tolerate in chair.  If no behavior issues again tomorrow can try in chair on Saturday. He is not a candidate for LTAC per SW. Unfortunately our options are limited to essentially hospice or waiting to see if mental status improves. His wife does not want him to go to hospice. Looking at possible out of state facility if behavior not improved.    Virgina Norfolk, PA-C [facility] 04/25/2023,1:13 PM  LOS: 44 days

## 2023-04-25 NOTE — Plan of Care (Signed)
Problem: RH BOWEL ELIMINATION Goal: RH STG MANAGE BOWEL WITH ASSISTANCE Description: STG Manage Bowel with mod I Assistance. Outcome: Progressing Goal: RH STG MANAGE BOWEL W/MEDICATION W/ASSISTANCE Description: STG Manage Bowel with Medication with mod I Assistance. Outcome: Progressing   Problem: Fluid Volume: Goal: Ability to maintain a balanced intake and output will improve Outcome: Progressing   Problem: Metabolic: Goal: Ability to maintain appropriate glucose levels will improve Outcome: Progressing   Problem: Nutritional: Goal: Maintenance of adequate nutrition will improve Outcome: Progressing Goal: Progress toward achieving an optimal weight will improve Outcome: Progressing   Problem: Skin Integrity: Goal: Risk for impaired skin integrity will decrease Outcome: Progressing   Problem: Tissue Perfusion: Goal: Adequacy of tissue perfusion will improve Outcome: Progressing   Problem: Clinical Measurements: Goal: Ability to maintain clinical measurements within normal limits will improve Outcome: Progressing Goal: Will remain free from infection Outcome: Progressing Goal: Diagnostic test results will improve Outcome: Progressing Goal: Respiratory complications will improve Outcome: Progressing Goal: Cardiovascular complication will be avoided Outcome: Progressing   Problem: Nutrition: Goal: Adequate nutrition will be maintained Outcome: Progressing   Problem: Coping: Goal: Level of anxiety will decrease Outcome: Progressing   Problem: Elimination: Goal: Will not experience complications related to bowel motility Outcome: Progressing Goal: Will not experience complications related to urinary retention Outcome: Progressing   Problem: Pain Managment: Goal: General experience of comfort will improve Outcome: Progressing   Problem: Safety: Goal: Ability to remain free from injury will improve Outcome: Progressing   Problem: Cardiac: Goal: Will  achieve and/or maintain hemodynamic stability Outcome: Progressing   Problem: Clinical Measurements: Goal: Postoperative complications will be avoided or minimized Outcome: Progressing   Problem: Respiratory: Goal: Respiratory status will improve Outcome: Progressing   Problem: Skin Integrity: Goal: Wound healing without signs and symptoms of infection Outcome: Progressing Goal: Risk for impaired skin integrity will decrease Outcome: Progressing   Problem: Activity: Goal: Ability to tolerate increased activity will improve Outcome: Progressing   Problem: Cardiac: Goal: Ability to achieve and maintain adequate cardiovascular perfusion will improve Outcome: Progressing   Problem: Activity: Goal: Ability to return to baseline activity level will improve Outcome: Progressing   Problem: Cardiovascular: Goal: Ability to achieve and maintain adequate cardiovascular perfusion will improve Outcome: Progressing Goal: Vascular access site(s) Level 0-1 will be maintained Outcome: Progressing   Problem: Health Behavior/Discharge Planning: Goal: Ability to safely manage health-related needs after discharge will improve Outcome: Progressing   Problem: Cardiac: Goal: Ability to achieve and maintain adequate cardiopulmonary perfusion will improve Outcome: Progressing   Problem: Clinical Measurements: Goal: Complications related to the disease process, condition or treatment will be avoided or minimized Outcome: Progressing   Problem: Consults Goal: RH GENERAL PATIENT EDUCATION Description: See Patient Education module for education specifics. Outcome: Not Progressing   Problem: RH SAFETY Goal: RH STG ADHERE TO SAFETY PRECAUTIONS W/ASSISTANCE/DEVICE Description: STG Adhere to Safety Precautions With cues Assistance/Device. Outcome: Not Progressing   Problem: RH KNOWLEDGE DEFICIT GENERAL Goal: RH STG INCREASE KNOWLEDGE OF SELF CARE AFTER HOSPITALIZATION Description: Patient  and wife will be able to manage care at discharge with educational resources independently Outcome: Not Progressing   Problem: Education: Goal: Ability to describe self-care measures that may prevent or decrease complications (Diabetes Survival Skills Education) will improve Outcome: Not Progressing Goal: Individualized Educational Video(s) Outcome: Not Progressing   Problem: Coping: Goal: Ability to adjust to condition or change in health will improve Outcome: Not Progressing   Problem: Health Behavior/Discharge Planning: Goal: Ability to identify and utilize available resources  and services will improve Outcome: Not Progressing Goal: Ability to manage health-related needs will improve Outcome: Not Progressing   Problem: Education: Goal: Knowledge of General Education information will improve Description: Including pain rating scale, medication(s)/side effects and non-pharmacologic comfort measures Outcome: Not Progressing   Problem: Health Behavior/Discharge Planning: Goal: Ability to manage health-related needs will improve Outcome: Not Progressing   Problem: Activity: Goal: Risk for activity intolerance will decrease Outcome: Not Progressing   Problem: Education: Goal: Will demonstrate proper wound care and an understanding of methods to prevent future damage Outcome: Not Progressing Goal: Knowledge of disease or condition will improve Outcome: Not Progressing Goal: Knowledge of the prescribed therapeutic regimen will improve Outcome: Not Progressing Goal: Individualized Educational Video(s) Outcome: Not Progressing   Problem: Activity: Goal: Risk for activity intolerance will decrease Outcome: Not Progressing   Problem: Urinary Elimination: Goal: Ability to achieve and maintain adequate renal perfusion and functioning will improve Outcome: Not Progressing   Problem: Education: Goal: Understanding of cardiac disease, CV risk reduction, and recovery process will  improve Outcome: Not Progressing Goal: Individualized Educational Video(s) Outcome: Not Progressing   Problem: Health Behavior/Discharge Planning: Goal: Ability to safely manage health-related needs after discharge will improve Outcome: Not Progressing   Problem: Education: Goal: Understanding of CV disease, CV risk reduction, and recovery process will improve Outcome: Not Progressing Goal: Individualized Educational Video(s) Outcome: Not Progressing   Problem: Education: Goal: Knowledge of cardiac device and self-care will improve Outcome: Not Progressing Goal: Individualized Educational Video(s) Outcome: Not Progressing   Problem: Education: Goal: Knowledge of disease and its progression will improve Outcome: Not Progressing   Problem: Fluid Volume: Goal: Compliance with measures to maintain balanced fluid volume will improve Outcome: Not Progressing   Problem: Nutritional: Goal: Ability to make healthy dietary choices will improve Outcome: Not Progressing   Problem: Safety: Goal: Non-violent Restraint(s) Outcome: Completed/Met

## 2023-04-25 NOTE — Progress Notes (Signed)
PROGRESS NOTE   Subjective/Complaints  No agitated behaviors reported during dialysis, was able to complete per nursing report.  No events overnight.  Psychiatry and neurology evaluated patient yesterday, neurology recommending discontinuation of Atarax and Ativan as these can exacerbate delirium.  Otherwise, lab workup benign, would also recommend bringing in CPAP from home if able but no additional workup recommended.  Patient to sleep in room sitting up with breakfast tray in front of him.  He is arousable to verbal and complaining of some minimal pain in his left ankle, but otherwise very lethargic and returns to sleep almost immediately.  Notable that neurology exam appears to have occurred at 2 AM.   ROS: Limited by cognition.  Denies fevers, chills, N/V, abdominal pain, constipation, diarrhea, SOB, cough, chest pain, new weakness or paraesthesias.    Objective:   No results found. Recent Labs    04/24/23 1342  WBC 4.2  HGB 10.7*  HCT 34.3*  PLT 170        Recent Labs    04/24/23 1342  NA 133*  K 4.1  CL 94*  CO2 23  GLUCOSE 129*  BUN 60*  CREATININE 10.93*  CALCIUM 9.2         Intake/Output Summary (Last 24 hours) at 04/25/2023 0900 Last data filed at 04/24/2023 1711 Gross per 24 hour  Intake --  Output 3000 ml  Net -3000 ml           Physical Exam: Vital Signs Blood pressure 102/81, pulse 63, temperature 97.7 F (36.5 C), resp. rate 18, height 5\' 4"  (1.626 m), weight 88.4 kg, SpO2 94%.   Constitutional: No distress . Vital signs reviewed.  asleep at breakfast table.  HEENT: NCAT, EOMI, oral membranes moist Cardiovascular: RRR without murmur. No JVD    Respiratory/Chest: CTA Bilaterally without wheezes or rales. Normal effort    GI/Abdomen: BS +, non-tender, non-distended Ext: no clubbing, cyanosis, or edema Psych: Pleasant, cooperative, remains moderately lethargic but bright and  responsive to seeing familiar faces. Skin: No evidence of breakdown, no evidence of rash.  IV intact Neurologic: AAO to self, month - lethergic today    + Cognitive deficits - difficult to assess d/t lethargy cranial nerves II through XII intact, Moving all 4 extremities antigravity   Bilateral upper extremity and facial tremors absent today musculoskeletal: No obvious deformities.   No joint swelling. L ankle mild ttp medial calf.    Assessment/Plan: 1. Functional deficits which require 15 hr per week of interdisciplinary therapy in a comprehensive inpatient rehab setting. Physiatrist is providing close team supervision and 24 hour management of active medical problems listed below. Physiatrist and rehab team continue to assess barriers to discharge/monitor patient progress toward functional and medical goals  Care Tool:  Bathing    Body parts bathed by patient: Right arm, Left arm, Chest, Abdomen, Front perineal area, Right upper leg, Buttocks, Left upper leg, Right lower leg, Left lower leg, Face   Body parts bathed by helper: Right arm, Left arm, Chest, Abdomen, Front perineal area, Buttocks, Right upper leg, Left upper leg, Right lower leg, Left lower leg, Face     Bathing assist Assist Level: Total Assistance -  Patient < 25%     Upper Body Dressing/Undressing Upper body dressing   What is the patient wearing?: Pull over shirt    Upper body assist Assist Level: Total Assistance - Patient < 25%    Lower Body Dressing/Undressing      Lower body dressing      What is the patient wearing?: Underwear/pull up, Pants     Lower body assist Assist for lower body dressing: Total Assistance - Patient < 25%     Toileting Toileting    Toileting assist Assist for toileting: Maximal Assistance - Patient 25 - 49%     Transfers Chair/bed transfer  Transfers assist     Chair/bed transfer assist level: Contact Guard/Touching assist      Locomotion Ambulation   Ambulation assist      Assist level: 2 helpers Assistive device: Walker-rolling Max distance: 90'   Walk 10 feet activity   Assist     Assist level: 2 helpers Assistive device: Hand held assist   Walk 50 feet activity   Assist Walk 50 feet with 2 turns activity did not occur: Safety/medical concerns  Assist level: Minimal Assistance - Patient > 75% Assistive device: No Device    Walk 150 feet activity   Assist Walk 150 feet activity did not occur: Safety/medical concerns  Assist level: Minimal Assistance - Patient > 75% Assistive device: No Device    Walk 10 feet on uneven surface  activity   Assist Walk 10 feet on uneven surfaces activity did not occur: Safety/medical concerns         Wheelchair     Assist Is the patient using a wheelchair?: Yes Type of Wheelchair: Manual    Wheelchair assist level: Dependent - Patient 0%      Wheelchair 50 feet with 2 turns activity    Assist        Assist Level: Dependent - Patient 0%   Wheelchair 150 feet activity     Assist      Assist Level: Dependent - Patient 0%   Blood pressure 102/81, pulse 63, temperature 97.7 F (36.5 C), resp. rate 18, height 5\' 4"  (1.626 m), weight 88.4 kg, SpO2 94%.  Medical Problem List and Plan: 1. Functional deficits secondary to metabolic encephalopathy due to sepsis secondary to catheter peritonitis and hypoxemia              -patient may not shower             -ELOS/Goals: 16-18 days, PT/OT Sup, SLP min A -tentative discharge date 9-9   10/8: Patient continuing to make some gains with CIR this week, but remains with waxing and waning cognitive status and agitation limiting dispo options.  Will work hard to try to get him to qualify for outpatient hemodialysis this week, if no improvements by 10/15 we will need to discuss dispo to SNF with HD.  Currently, no options for home dialysis.            -Continue CIR therapies including  PT, OT, and SLP   -[facility] -- encephalopathy/aggitation has developed since approximately 8/22, with decline earlier following COVID infection per wife   -Therapy QD schedule  - Con't CIR PT, OT and SLP  - Patient was seen by palliative care, changed to DNR status -Marcelino Duster with palliative continuing to follow  -Tried wife at bedside with dialysis on 10-3, patient remained agitated and pulling at lines, per nephrology not a candidate for outpatient hemodialysis.    -  10/4: After discussion with medical, admissions, and social work team, current options appear to be transitioning patient back to PD versus placement at out-of-state SNF with HD capabilities versus converting patient to hospice.  Nephrology discussed this with the wife, she is opting for vascular surgery consultation and attempt to revise back to PD to allow the patient to return home without hospice care.   10/6 vascular is not recommending conversion to PD.   10-7: Palliative signed off  10/9: Per nephrology "Will need consistent safe behavior during HD to be candidate for outpatient.  Will also need to ensure able to tolerate in chair.  If no behavior issues again tomorrow can try in chair on Saturday. "  2.  Antithrombotics: -DVT/anticoagulation:  Pharmaceutical: Heparin, 03/23/23 changed to Lovenox -  consistent refusal -  started on Eliquis 2.5 mg BID - Dced 9/12 per cardiology "With patient's chronic anemia and agitation/recent falls, appears that risk of OAC>benefits at this time... ". SCDs at bedtime added 10/4.              -antiplatelet therapy: aspirin and Plavix --> ASA alone per cardiology 9/11   3. Pain Management:              -Lidoderm patches to back, Tylenol every 4 hours as needed's, muscle rub as needed, and oxycodone 2.5 mg 3 times daily as needed  -Elavil 25 mg at bedtime for nerve pain and sleep 8/27 -weaned off 9-7 due to oversedation  - No use of PRNs or general complaints of pain since  9-29  4.  Lethargy/ intermittent agitation /insomnia: Persistent despite interventions  -Weaned off of veil bed, remains on telesitter for safety.  Appears somewhat better with wife at bedside and when seated at the station.             -Admitted on Seroquel 50 mg TID -weaned off             -clonazepam 0.5 mg TID prn -transition to lorazepam 0.5 mg daily p.o. or IV as needed for anxiety, agitation -10-4 psychiatry premedicating dialysis with 0.5 mg ativan -Currently have Atarax first-line and Ativan 0.5 mg scheduled predialysis for agitation; redirect and avoid overstimulation as much as possible. increasing sedating medications is much as possible, as there is a direct effect on patient's sleep, confusion, and agitation up to a day after receiving PRN antipsychotics and Ativan.              - Dced Geodon 10 mg IM q 12 hours prn d/t  switched to Abilify per psychiatry 9-17             -8-28 added depakote 125 mg q8h for ongoing aggitation, mood stabilization -discontinued 9-2, resumed 9-21, increased 9-23 - level 18 subtherapeutic increased to 250 mg Q12h 10/4 -> psych changing to long-acting to 250 mg nightly 10-6; repeat levels 10-8 - <10  10-8: D/w psych adjusting vs. Dcing depakote; in agreement with plan, Depakote DC'd, appreciate their insight and assistance with this patient   - 9-17 Abilify started per psychiatry, increased to 7.5 mg 9-27  - 9-24 memantine 5 mg daily started per psychiatry, discontinued 10-3 per family request due to increasing confusion and lethargy -Psychiatry following, appreciate their assistance and recommendations in managing this patient - Responsive to low stimulating environment, music, and redirection.  Remains intermittently agitated, especially with dialysis and at nighttime, but is significantly improving over the weeks.   10/8: Poor sleep overnight, remains pleasant and cooperative today  and appreciate overall slight improvement in behaviors this week. 10/9:  Neurology noting pre-existing cognitive deficits documented on the outpatient side; while these are significantly worse, may be beneficial to reach out to other family members regarding his prior cognitive baseline.  Will also look into family bringing in CPAP.  5. Hypoactive delirium/metabolic encephalopathy: This patient is not capable of making decisions on his own behalf.  Persistent despite significant efforts to workup/treat.  -  8/29: EEG negative, 8-30: MRI showing chronic microvascular changes, otherwise normal.  Multiple lab workups for encephalopathy with CMP, ammonia, CBC negative except for low VPA.   -  10-2: Lethargic, jerking , BG 70s, improved with POs.  Resume blood glucose check twice daily. Repeat VPA levels (18).  CMP CBC look okay.    -10/8: Neuro also to eval as above; appreciate their assistance and recommendations. -recommending discontinuation of Atarax and Ativan; will consider if he is able to tolerate transition to dialysis chair.  Appreciate their assistance and recommendations.  Folate, B12, and TSH normal.   6. Skin/Wound Care: Routine skin care checks             -sacral skin off-loading/protection   7. Fluids/Electrolytes/Nutrition: Strict Is and Os and follow-up chemistries per nephrology             - renal diet/ 1200 cc FR  - Renvela 800 mg 3 times daily for hyperphosphatemia added per nephrology 8-27   -Management per nephrology, labs appear approximately stable, p.o. intakes are appropriate at this time but wax and wane with lethargy    8: Hypotension/Hypertension: monitor TID and prn             -continue midodrine as needed during dialysis  9-1: add midodrine 5 mg tid and monitor.  DC Coreg as heart rate has been low to normal; may resume at low-dose once blood pressure improved.  9/13 DC midodrine   - Intermittent hypertension into the 150s, is higher on off dialysis days.  Would maintain higher perfusion pressure given microvascular changes seen on MRI.   No medication adjustments for now.  10-7 well-controlled, with intermittent transient elevations.    04/25/2023    6:15 AM 04/24/2023    7:54 PM 04/24/2023    5:38 PM  Vitals with BMI  Weight   194 lbs 14 oz  Systolic 102 136   Diastolic 81 79   Pulse 63 67      9: Hyperlipidemia: continue statin   10: CAD s/p CABG 2023: on DAPT and statin.  - troponin elevated but stable, monitor 8/27   11: DM-2: A1c = 6.3% (at home on Lantus 22 units at bedtime, Tradjenta 5 mg daily)        -monitor PO intake             -continue SSI (0-6 units) does not appear to be requiring coverage   - 9/6 DC SSI, change CBGs to daily   - 10-2: Hypoglycemic today, 70s, improved with p.o. intakes, 88 at recheck.  Likely due to poor p.o. intakes last few days.  Start CBG checks twice daily  - 10-4: Patient agitated with CBG checks, refusing.  Moved to as needed for lethargy, AMS No results for input(s): "GLUCAP" in the last 72 hours.    12: ESRD: HD on Tues, Thurs, Sat -nephrology made aware  -Regularly have to hold dialysis or stopped early due to agitated behaviors, which is ongoing.  Recently has been able to receive all or part  of most dialysis sessions.  -Have tried premedicating patient and providing as needed medications with Haldol, Ativan as available on dialysis unit; measures helped temporarily but overall do not seem to affect treatment  -Wife at bedside for dialysis 10-3, did not improve behaviors, per nephrology patient is not a candidate for outpatient dialysis.     -10/5-6 see discussion above. Wife tries to help in HD. However, behaviors continue to make HD difficult.  -Unfortunately, vascular not offering conversion to PD.  Will discuss possible options with nephrology.    - 10/8:  Nephro to proved detailed notes on dialysis behaviors this week to target; goal to be OP HD candidate -no behaviors noted on 10-8 session  13: COPD/OSA; chronic home 2L O2 (Symbicort, Ventolin, prednisone 40 mg daily  at home)             -continue symbicort  -Added home oxygen back 8-27  -Satting well on room air, oxygen not required   14: GERD: continue Protonix   15: Non-sustained VT/pAF/bradycardia: per cardiology, Dr. Algie Coffer: -continue carvedilol 6.25 mg BID; consider increasing to 12.5 mg BID before starting low dose amiodarone  -consider Eliquis and discontinue asa and Plavix in one week if no active GI bleeding and stable hemoglobin from 8/24 -hemoglobin has remained stable but very low around 7, no recurrent A-fib, monitor for now given agitation/fall 8-31 and consider initiating Eliquis when safe -Some intermittent recurrent bradycardia while at inpatient rehab, improved with discontinuation of Elavil and Geodon -9/11 EKG for bradycardia episode today, 2nd degree A-V block mobitz type 1.  Continue to hold beta blocker,9/11 will restart eliquis at 2.5 mg BID dose. Will consult cardiology regarding need for ASA/plavix and coreg.  Dr. Elease Hashimoto does not appear convinced that he has A-fib 9/12 per cardiology, DAPT transition to daily aspirin 81 only and Eliquis was discontinued - Heart rate currently well-controlled, hypertension as above, continuing to hold Eliquis due to falls/poor safety awareness, starting SCDs as above   16: HFrEF: Daily weights. Monitor for signs of overload  -Weights have remained overall stable/downtrending with hemodialysis since admission -Weights have not been repeated in some time, however patient appears normovolemic on exam. -10-8 2 weights significantly different, likely related to dialysis.  No concerns for fluid overload. Filed Weights   04/21/23 1337 04/24/23 1327 04/24/23 1738  Weight: 94.3 kg 91.4 kg 88.4 kg      17: Liver nodularity on CT scan: LFTs reported to be normal. Avoid hepatotoxic medications    -Abdominal ultrasound earlier this admission with minimal ascites  -Ammonia levels have remained normal on multiple checks   18: Epistaxis - resolved              -previously improved with flonase and saline spray   19: Morbid Obesity. Dietary counseling Body mass index is 33.45 kg/m.    20. Hematuria - resolved  9/22- required in/out to check U/A- having new hematuria- nursing to keep an eye- since also bleeding from penis a little- monitor   9/23 no wBCs  on UA, follow  urine culture-Staphylococcus hemolyticus-discussed with pharmacy and ID pharmacy.  Think this is more likely contaminant  9/25 discuss with nephrology about possible treatment or other evaluation options  Spoke with Dr. Ronalee Belts, most likely contaminant.  Recommended treating only if symptomatic.  If hematuria continues consider urology input  9/27 urine has improved to clear yellow   21.  Vitamin C deficiency.  Aggressive repletion per psychiatry, 100 mg IV x 6+ 500 mg p.o.  daily.  Feel this may help his cognition.  -10-7: Am noting some slightly improved attention, memory on gross exam.  23.  Chest pain a.m. 10-3.  EKG unchanged, self resolved.  -control bp as possible  24. L ankle pain. Minimal on palpation, no known trauma, will inform therapies to monitor and wrap for stability if needed  LOS: 44 days A FACE TO FACE EVALUATION WAS PERFORMED  Angelina Sheriff 04/25/2023, 9:00 AM

## 2023-04-26 LAB — RENAL FUNCTION PANEL
Albumin: 2.5 g/dL — ABNORMAL LOW (ref 3.5–5.0)
Anion gap: 17 — ABNORMAL HIGH (ref 5–15)
BUN: 49 mg/dL — ABNORMAL HIGH (ref 8–23)
CO2: 24 mmol/L (ref 22–32)
Calcium: 9.1 mg/dL (ref 8.9–10.3)
Chloride: 96 mmol/L — ABNORMAL LOW (ref 98–111)
Creatinine, Ser: 10.77 mg/dL — ABNORMAL HIGH (ref 0.61–1.24)
GFR, Estimated: 5 mL/min — ABNORMAL LOW (ref >60)
Glucose, Bld: 103 mg/dL — ABNORMAL HIGH (ref 70–99)
Phosphorus: 4.4 mg/dL (ref 2.5–4.6)
Potassium: 4.2 mmol/L (ref 3.5–5.1)
Sodium: 137 mmol/L (ref 135–145)

## 2023-04-26 LAB — CBC
HCT: 34.4 % — ABNORMAL LOW (ref 39.0–52.0)
Hemoglobin: 10.5 g/dL — ABNORMAL LOW (ref 13.0–17.0)
MCH: 27.7 pg (ref 26.0–34.0)
MCHC: 30.5 g/dL (ref 30.0–36.0)
MCV: 90.8 fL (ref 80.0–100.0)
Platelets: 170 10*3/uL (ref 150–400)
RBC: 3.79 MIL/uL — ABNORMAL LOW (ref 4.22–5.81)
RDW: 15.8 % — ABNORMAL HIGH (ref 11.5–15.5)
WBC: 4.4 10*3/uL (ref 4.0–10.5)
nRBC: 0 % (ref 0.0–0.2)

## 2023-04-26 MED ORDER — PENTAFLUOROPROP-TETRAFLUOROETH EX AERO: 1.0000 | INHALATION_SPRAY | CUTANEOUS | Status: DC | PRN

## 2023-04-26 NOTE — Progress Notes (Signed)
Speech Language Pathology Daily Session Note  Patient Details  Name: [name] [name] MRN: [id number] Date of Birth: 1950/10/11  Today's Date: 04/26/2023 SLP Individual Time: 0730-0755 SLP Individual Time Calculation (min): 25 min  Short Term Goals: Week 7: SLP Short Term Goal 1 (Week 7): Patient will utilize external aids to orient to time and situation given modA SLP Short Term Goal 2 (Week 7): Pt will complete functional problem solving with 60% acc with max A. SLP Short Term Goal 3 (Week 7): Pt will sustain attention to functional task for 10 minutes given mod multimodal cues. SLP Short Term Goal 4 (Week 7): Pt will answer biographical questions with 90% accuracy with mod A multimodal cues.  Skilled Therapeutic Interventions: Skilled treatment session focused on cognitive goals. Upon arrival, patient was awake while upright in the wheelchair. Patient was disoriented to time and place with SLP providing overall Max A verbal cues for use of external aids. Patient reported he had not seen his wife in over 3 days with total A needed for recall of previous events as patient's wife comes to visit everyday and assists with ADLs. Patient requested his breakfast with SLP providing set-up assist. Patient was able to demonstrate selective attention to self-feeding in a mildly distracting environment for 10 minutes independently. Despite confusion, patient remained calm and cooperative throughout session. Patient left upright in wheelchair at nurse's station with alarm on. Continue with current plan of care.   Pain No/Denies Pain   Therapy/Group: Individual Therapy  Skyy Mcknight 04/26/2023, 2:32 PM

## 2023-04-26 NOTE — Progress Notes (Signed)
PROGRESS NOTE   Subjective/Complaints  No agitated behaviors reported. Vitals stable. LBM 10/9, large, continent.  Seen this morning eating a [employer] breakfast, with wife at bedside.  States he did have some pain in his right ankle this morning, but not currently.  No further issues with left ankle pain.  Wife asked about bluish-purple discoloration of toes, which not appreciated on exam.  Discussed in depth with wife patient's overall medical course, ongoing diagnosis of metabolic encephalopathy, course of disease and emphasized gradual and possible incomplete recovery.  All questions answered, patient's wife expressed understanding.  Slept okay overnight, approximately 4 to 5 hours per record.   ROS: Limited by cognition.  Denies fevers, chills, N/V, abdominal pain, constipation, diarrhea, SOB, cough, chest pain, new weakness or paraesthesias.  Pertinent positives per HPI above.  Objective:   No results found. Recent Labs    04/24/23 1342  WBC 4.2  HGB 10.7*  HCT 34.3*  PLT 170        Recent Labs    04/24/23 1342  NA 133*  K 4.1  CL 94*  CO2 23  GLUCOSE 129*  BUN 60*  CREATININE 10.93*  CALCIUM 9.2         Intake/Output Summary (Last 24 hours) at 04/26/2023 1007 Last data filed at 04/25/2023 1351 Gross per 24 hour  Intake 240 ml  Output --  Net 240 ml           Physical Exam: Vital Signs Blood pressure 136/81, pulse 68, temperature 98.2 F (36.8 C), resp. rate 17, height 5\' 4"  (1.626 m), weight 88.4 kg, SpO2 98%.   Constitutional: No distress . Vital signs reviewed.  Sitting up, eating breakfast. HEENT: NCAT, EOMI, oral membranes moist; bilateral puffiness under eyes, left more than right. Cardiovascular: RRR without murmur. No JVD .  No peripheral edema. Respiratory/Chest: CTA Bilaterally without wheezes or rales. Normal effort    GI/Abdomen: BS +, non-tender, non-distended Ext: no  clubbing, cyanosis; no appreciable discoloration of bilateral toes. Psych: Pleasant, cooperative, much more awake/alert than prior exams today.   Does get agitated with being unable to walk around the room independently, otherwise affect appropriate.  Skin: No evidence of breakdown, no evidence of rash. RUE  IV intact  Neurologic: AAO to self, place as "cone", month and year with options  + Cognitive deficits - improved/less lethargic today. Still impulsive but easily redirected.  cranial nerves II through XII intact, Moving all 4 extremities antigravity  and against resistance.  Bilateral upper extremity and facial tremors absent today musculoskeletal: No obvious deformities.   No joint swelling. No ttp bilateral ankles, calves.    Assessment/Plan: 1. Functional deficits which require 15 hr per week of interdisciplinary therapy in a comprehensive inpatient rehab setting. Physiatrist is providing close team supervision and 24 hour management of active medical problems listed below. Physiatrist and rehab team continue to assess barriers to discharge/monitor patient progress toward functional and medical goals  Care Tool:  Bathing    Body parts bathed by patient: Right arm, Left arm, Chest, Abdomen, Front perineal area, Right upper leg, Buttocks, Left upper leg, Right lower leg, Left lower leg, Face   Body parts bathed  by helper: Right arm, Left arm, Chest, Abdomen, Front perineal area, Buttocks, Right upper leg, Left upper leg, Right lower leg, Left lower leg, Face     Bathing assist Assist Level: Total Assistance - Patient < 25%     Upper Body Dressing/Undressing Upper body dressing   What is the patient wearing?: Pull over shirt    Upper body assist Assist Level: Total Assistance - Patient < 25%    Lower Body Dressing/Undressing      Lower body dressing      What is the patient wearing?: Underwear/pull up, Pants     Lower body assist Assist for lower body dressing:  Total Assistance - Patient < 25%     Toileting Toileting    Toileting assist Assist for toileting: Maximal Assistance - Patient 25 - 49%     Transfers Chair/bed transfer  Transfers assist     Chair/bed transfer assist level: Contact Guard/Touching assist     Locomotion Ambulation   Ambulation assist      Assist level: 2 helpers Assistive device: Walker-rolling Max distance: 90'   Walk 10 feet activity   Assist     Assist level: 2 helpers Assistive device: Hand held assist   Walk 50 feet activity   Assist Walk 50 feet with 2 turns activity did not occur: Safety/medical concerns  Assist level: Minimal Assistance - Patient > 75% Assistive device: No Device    Walk 150 feet activity   Assist Walk 150 feet activity did not occur: Safety/medical concerns  Assist level: Minimal Assistance - Patient > 75% Assistive device: No Device    Walk 10 feet on uneven surface  activity   Assist Walk 10 feet on uneven surfaces activity did not occur: Safety/medical concerns         Wheelchair     Assist Is the patient using a wheelchair?: Yes Type of Wheelchair: Manual    Wheelchair assist level: Dependent - Patient 0%      Wheelchair 50 feet with 2 turns activity    Assist        Assist Level: Dependent - Patient 0%   Wheelchair 150 feet activity     Assist      Assist Level: Dependent - Patient 0%   Blood pressure 136/81, pulse 68, temperature 98.2 F (36.8 C), resp. rate 17, height 5\' 4"  (1.626 m), weight 88.4 kg, SpO2 98%.  Medical Problem List and Plan: 1. Functional deficits secondary to metabolic encephalopathy due to sepsis secondary to catheter peritonitis and hypoxemia              -patient may not shower             -ELOS/Goals: 16-18 days, PT/OT Sup, SLP min A -tentative discharge date 9-9   10/8: Patient continuing to make some gains with CIR this week, but remains with waxing and waning cognitive status and  agitation limiting dispo options.  Will work hard to try to get him to qualify for outpatient hemodialysis this week, if no improvements by 10/15 we will need to discuss dispo to SNF with HD.  Currently, no options for home dialysis.            -Continue CIR therapies including PT, OT, and SLP   -[facility] -- encephalopathy/aggitation has developed since approximately 8/22, with decline earlier following COVID infection per wife   -Therapy QD schedule  - Con't CIR PT, OT and SLP  - Patient was seen by palliative  care, changed to DNR status -Marcelino Duster with palliative continuing to follow  -Tried wife at bedside with dialysis on 10-3, patient remained agitated and pulling at lines, per nephrology not a candidate for outpatient hemodialysis.    - 10/4: After discussion with medical, admissions, and social work team, current options appear to be transitioning patient back to PD versus placement at out-of-state SNF with HD capabilities versus converting patient to hospice.  Nephrology discussed this with the wife, she is opting for vascular surgery consultation and attempt to revise back to PD to allow the patient to return home without hospice care.   10/6 vascular is not recommending conversion to PD.   10-7: Palliative signed off  10/9: Per nephrology "Will need consistent safe behavior during HD to be candidate for outpatient.  Will also need to ensure able to tolerate in chair.  If no behavior issues again tomorrow can try in chair on Saturday. " - pending this afternoon  2.  Antithrombotics: -DVT/anticoagulation:  Pharmaceutical: Heparin, 03/23/23 changed to Lovenox -  consistent refusal -  started on Eliquis 2.5 mg BID - Dced 9/12 per cardiology "With patient's chronic anemia and agitation/recent falls, appears that risk of OAC>benefits at this time... ". SCDs at bedtime added 10/4.              -antiplatelet therapy: aspirin and Plavix --> ASA alone per cardiology 9/11   3.  Pain Management:              -Lidoderm patches to back, Tylenol every 4 hours as needed's, muscle rub as needed, and oxycodone 2.5 mg 3 times daily as needed  -Elavil 25 mg at bedtime for nerve pain and sleep 8/27 -weaned off 9-7 due to oversedation  - No use of PRNs or general complaints of pain since 9-29  4.  Lethargy/ intermittent agitation /insomnia: Persistent despite interventions  -Weaned off of veil bed, remains on telesitter for safety.  Appears somewhat better with wife at bedside and when seated at the station.             -Admitted on Seroquel 50 mg TID -weaned off             -clonazepam 0.5 mg TID prn -transition to lorazepam 0.5 mg daily p.o. or IV as needed for anxiety, agitation -10-4 psychiatry premedicating dialysis with 0.5 mg ativan -Currently have Atarax first-line and Ativan 0.5 mg scheduled predialysis for agitation; redirect and avoid overstimulation as much as possible.   - Avoid increasing sedating medications as much as possible, as there is a direct effect on patient's sleep, confusion, and agitation up to a day after receiving PRN antipsychotics and Ativan.              - Dced Geodon 10 mg IM q 12 hours prn d/t  switched to Abilify per psychiatry 9-17             -8-28 added depakote 125 mg q8h for ongoing aggitation, mood stabilization -discontinued 9-2, resumed 9-21, increased 9-23 - level 18 subtherapeutic increased to 250 mg Q12h 10/4 -> psych changing to long-acting to 250 mg nightly 10-6; repeat levels 10-8 - <10  - 10-8: D/w psych adjusting vs. Dcing depakote; in agreement with plan, Depakote Dc'd 10/9   - 9-17 Abilify started per psychiatry, increased to 7.5 mg 9-27  - 9-24 memantine 5 mg daily started per psychiatry, discontinued 10-3 per family request due to increasing confusion and lethargy -Psychiatry following, appreciate their assistance and recommendations  in managing this patient - Responsive to low stimulating environment, music, and redirection.   Remains intermittently agitated, especially with dialysis and at nighttime, but is significantly improving over the weeks.   10/8: Poor sleep overnight, remains pleasant and cooperative today and appreciate overall slight improvement in behaviors this week. 10/9: Neurology noting pre-existing cognitive deficits documented on the outpatient side; while these are significantly worse, may be beneficial to reach out to other family members regarding his prior cognitive baseline.  Will also look into family bringing in CPAP.  5. Hypoactive delirium/metabolic encephalopathy: This patient is not capable of making decisions on his own behalf.  Persistent despite significant efforts to workup/treat.  -  8/29: EEG negative, 8-30: MRI showing chronic microvascular changes, otherwise normal.  Multiple lab workups for encephalopathy with CMP, ammonia, CBC negative except for low VPA.   -  10-2: Lethargic, jerking , BG 70s, improved with POs.  Resume blood glucose check twice daily. Repeat VPA levels (18).  CMP CBC look okay.    -10/8: Neuro also to eval as above; appreciate their assistance and recommendations. -recommending discontinuation of Atarax and Ativan; will consider if he is able to tolerate transition to dialysis chair 10/12.  Appreciate their assistance and recommendations.  Folate, B12, and TSH normal.   6. Skin/Wound Care: Routine skin care checks             -sacral skin off-loading/protection   7. Fluids/Electrolytes/Nutrition: Strict Is and Os and follow-up chemistries per nephrology             - renal diet/ 1200 cc FR  - Renvela 800 mg 3 times daily for hyperphosphatemia added per nephrology 8-27   - Management per nephrology, labs appear approximately stable, p.o. intakes are appropriate at this time but wax and wane with lethargy   - 10/2-10: Mild hyponatremia 134-133 recently; is not worsening, has dipped into this range before, no apparent cognitive effects. Continue management with  dialysis.     8: Hypotension/Hypertension: monitor TID and prn             -continue midodrine as needed during dialysis  9-1: add midodrine 5 mg tid and monitor.  DC Coreg as heart rate has been low to normal; may resume at low-dose once blood pressure improved.  9/13 DC midodrine   - Intermittent hypertension into the 150s, is higher on off dialysis days.  Would maintain higher perfusion pressure given microvascular changes seen on MRI.  No medication adjustments for now.  10-7 - 10/9 well-controlled, with intermittent transient elevations.    04/26/2023    6:36 AM 04/25/2023    7:45 PM 04/25/2023    1:40 PM  Vitals with BMI  Systolic 136 107 161  Diastolic 81 68 81  Pulse 68 63 67     9: Hyperlipidemia: continue statin   10: CAD s/p CABG 2023: on DAPT and statin.  - troponin elevated but stable, monitor 8/27   11: DM-2: A1c = 6.3% (at home on Lantus 22 units at bedtime, Tradjenta 5 mg daily)        -monitor PO intake             -continue SSI (0-6 units) does not appear to be requiring coverage   - 9/6 DC SSI, change CBGs to daily   - 10-2: Hypoglycemic today, 70s, improved with p.o. intakes, 88 at recheck.  Likely due to poor p.o. intakes last few days.  Start CBG checks twice daily  - 10-4:  Patient agitated with CBG checks, refusing.  Moved to as needed for lethargy, AM   12: ESRD: HD on Tues, Thurs, Sat -nephrology made aware  -Regularly have to hold dialysis or stopped early due to agitated behaviors, which is ongoing.  Recently has been able to receive all or part of most dialysis sessions.  -Have tried premedicating patient and providing as needed medications with Haldol, Ativan as available on dialysis unit; measures helped temporarily but overall do not seem to affect treatment  -Wife at bedside for dialysis 10-3, did not improve behaviors, per nephrology patient is not a candidate for outpatient dialysis.     -10/5-6 see discussion above. Wife tries to help in HD.  However, behaviors continue to make HD difficult.  -Unfortunately, vascular not offering conversion to PD.  Will discuss possible options with nephrology.    - 10/8:  Nephro to proved detailed notes on dialysis behaviors this week to target; goal to be OP HD candidate -no behaviors noted on 10-8 session  13: COPD/OSA; chronic home 2L O2 (Symbicort, Ventolin, prednisone 40 mg daily at home)             -continue symbicort  -Added home oxygen back 8-27  -Satting well on room air, oxygen not required   14: GERD: continue Protonix   15: Non-sustained VT/pAF/bradycardia: resolved. per cardiology, Dr. Algie Coffer: -continue carvedilol 6.25 mg BID; consider increasing to 12.5 mg BID before starting low dose amiodarone  -consider Eliquis and discontinue asa and Plavix in one week if no active GI bleeding and stable hemoglobin from 8/24 -hemoglobin has remained stable but very low around 7, no recurrent A-fib, monitor for now given agitation/fall 8-31 and consider initiating Eliquis when safe -Some intermittent recurrent bradycardia while at inpatient rehab, improved with discontinuation of Elavil and Geodon -9/11 EKG for bradycardia episode today, 2nd degree A-V block mobitz type 1.  Continue to hold beta blocker,9/11 will restart eliquis at 2.5 mg BID dose. Will consult cardiology regarding need for ASA/plavix and coreg.  Dr. Elease Hashimoto does not appear convinced that he has A-fib 9/12 per cardiology, DAPT transition to daily aspirin 81 only and Eliquis was discontinued - Heart rate currently well-controlled, hypertension as above, continuing to hold Eliquis due to falls/poor safety awareness, starting SCDs as above   16: HFrEF: Daily weights fluctuating significantly d/t dialysis. Monitor for signs of overload. No current concerns.    17: Liver nodularity on CT scan: LFTs reported to be normal. Avoid hepatotoxic medications    -Abdominal ultrasound earlier this admission with minimal ascites  -Ammonia  levels have remained normal on multiple checks   18: Epistaxis - resolved             -previously improved with flonase and saline spray   19: Morbid Obesity. Dietary counseling Body mass index is 33.45 kg/m.    20. Hematuria - resolved  9/22- required in/out to check U/A- having new hematuria- nursing to keep an eye- since also bleeding from penis a little- monitor   9/23 no wBCs  on UA, follow  urine culture-Staphylococcus hemolyticus-discussed with pharmacy and ID pharmacy.  Think this is more likely contaminant  9/25 discuss with nephrology about possible treatment or other evaluation options  Spoke with Dr. Ronalee Belts, most likely contaminant.  Recommended treating only if symptomatic.  If hematuria continues consider urology input  9/27 urine has improved to clear yellow   21.  Vitamin C deficiency.  Aggressive repletion per psychiatry, 100 mg IV x 6+ 500 mg  p.o. daily.  Feel this may help his cognition.  -04-22-09: Am noting some slightly improved attention, memory on gross exam since IV course; continue PO supplementation  23.  Chest pain a.m. 10-3.  EKG unchanged, self resolved.  -control bp as possible  24. Bilateral ankle pain. Changing locations, no known trauma, no instability, edema, or point tenderness. ?Peripheral neuropathy related; may benefit from Qutenza but would not add sedating medications for tx at this time.   LOS: 45 days A FACE TO FACE EVALUATION WAS PERFORMED  Angelina Sheriff 04/26/2023, 10:07 AM

## 2023-04-26 NOTE — Progress Notes (Signed)
Physical Therapy Session Note  Patient Details  Name: [name] [name] MRN: [id number] Date of Birth: 01/15/1951  Today's Date: 04/26/2023 PT Individual Time: 1100-1130 PT Individual Time Calculation (min): 30 min   Short Term Goals: Week 6:  PT Short Term Goal 1 (Week 6): To continue to consistently meet LTG  Skilled Therapeutic Interventions/Progress Updates:    Pt presents in room in [facility] with wife present, agreeable and motivated to participate with therapy, denies pain at this time. Pt with significantly improved alertness and command following this session. Session focused on gait training during session for tolerance to upright.  Pt transported from room to day room dependently for energy conservation. Pt ambulates 242' and 200' with RW CGA/min assist with fatigue and 2nd person WC follow demonstrates good proximity to RW initially but demonstrates decreased proximity, increased UE weightbearing, and forward trunk flexion with fatigue. 2nd person WC follow provided in the event of impulsive sitting however pt does not demonstrate this during session. Pt switched shoes between trials due to pt reporting initial shoes are too big causing him to shuffle his feet, improved step height and length noted with switching shoes to black Skechers.  Pt transported back to room dependently in [facility] for energy conservation. Pt demonstrates good safety awareness prior to sit<>stand for transfer back to bed by asking if WC is locked without prompting. Pt then completes stand step transfer with RW, CGA, mod cues for positioning prior to sit.  Pt remains supine in bed with all needs within reach, wife present at bedside, and bed alarm activated at end of session.  Therapy Documentation Precautions:  Precautions Precautions: Fall Restrictions Weight Bearing Restrictions: No   Therapy/Group: Individual Therapy  Edwin Cap PT, DPT 04/26/2023, 11:34 AM

## 2023-04-26 NOTE — Progress Notes (Signed)
[facility] Progress Note   Subjective:   Patient seen and examined at nurses station.  Sitting in wheelchair.  Alert.  Reports trouble falling asleep last night.  No other specific complaints. Improvement in behavior this week.    Discussed possibly having HD in chair on Saturday if patient has no safety/behavior concerns today.  Per HD charge nurse, unable to do so on Saturday d/t staff shortage.  Can try next week if ongoing safe behavior today and Saturday.   Objective Vitals:   04/25/23 1945 04/26/23 0636 04/26/23 1338 04/26/23 1350  BP: 107/68 136/81 117/70 105/78  Pulse: 63 68  86  Resp: 18 17 17 16   Temp: 98.5 F (36.9 C) 98.2 F (36.8 C) 98 F (36.7 C)   TempSrc:      SpO2: 93% 98%  96%  Weight:   88.8 kg   Height:       Physical Exam General:WDWN male in NAD, sitting in wheelchair Heart:RRR, no mrg Lungs:+crackles in bases, nml WOB on RA Abdomen:soft, NTND Extremities:trace LE edema Dialysis Access: LU AVF +b/t   Filed Weights   04/24/23 1327 04/24/23 1738 04/26/23 1338  Weight: 91.4 kg 88.4 kg 88.8 kg   No intake or output data in the 24 hours ending 04/26/23 1418  Additional Objective Labs: Basic Metabolic Panel: Recent Labs  Lab 04/24/23 1342  NA 133*  K 4.1  CL 94*  CO2 23  GLUCOSE 129*  BUN 60*  CREATININE 10.93*  CALCIUM 9.2  PHOS 4.9*   Liver Function Tests: Recent Labs  Lab 04/24/23 1342  ALBUMIN 2.6*    CBC: Recent Labs  Lab 04/24/23 1342  WBC 4.2  HGB 10.7*  HCT 34.3*  MCV 89.8  PLT 170   Medications:  sodium chloride 10 mL/hr at 04/18/23 1515   dextrose 5 % 100 mL with ascorbic acid (VITAMIN C) 200 mg infusion 100 mL/hr at 04/22/23 0535    (feeding supplement) PROSource Plus  30 mL Oral BID BM   ARIPiprazole  7.5 mg Oral QHS   [START ON 05/02/2023] ascorbic acid  500 mg Oral Daily   aspirin EC  81 mg Oral Daily   atorvastatin  80 mg Oral Daily   Chlorhexidine Gluconate Cloth  6 each Topical Q0600    darbepoetin (ARANESP) injection - DIALYSIS  150 mcg Subcutaneous Q Sun-1800   feeding supplement  237 mL Oral BID WC   Gerhardt's butt cream  1 Application Topical QID   lidocaine  1 patch Transdermal Q2200   lidocaine  1 patch Transdermal Q24H   lidocaine-prilocaine   Topical Once   LORazepam  1 mg Oral Q T,Th,Sa-HD   mometasone-formoterol  2 puff Inhalation BID   pantoprazole  40 mg Oral BID   traZODone  50 mg Oral QHS    Dialysis Orders: Previously on [facility] thru [facility] - changed to HD - will need outpatient HD unit established prior to d/c. 4hr, 3K, EDW ~96, L AVF    Assessment/Plan: 1. Debility - in CIR. Appropriateness for CIR nearing its end per NP.   2. Encephalopathy/agitation: Waxing/waning agitation, dementia, sundowning. No acute findings on imaging. Neruo and Psych consulting to help improve behavior.  No behavior issues reported with last HD. Requesting better documentation on behavior with HD.  He has been disruptive during HD.  Did not improve with wife at bedside. Patient requires one on one HD for his safety, which is not possible in outpatient setting.  Use of IV  Ativan can not be done in OP HD units.   IR will not place catheter, agitation is not an indication to place catheter.  Considered switching to PD so patient can complete dialysis at home, but vascular surgery felt he was high risk for PD catheter placement.  Meds being adjusted per primary team/psychiatry team.   3. ESRD: Transitioned from PD to HD recently on TTS schedule. Using old AVF which is still functional. Family would prefer TTS HD. Coordinate with rehab team so they know to premedicate before HD, but he is not always willing to take his medications. Using emla cream to decrease pain with cannulation.  4.Hypertension/volume: BP in goal. Midodrine 5mg  stopped. Crackles on exam, increase UF with HD today.  5. Anemia: last Hgb 10.7-on Aranesp weekly. Last Tsat 38% with ferritin 2175.  No iron.   6. Metabolic bone disease: CorrCa and phos in goal. No VDRA.  7.  Nutrition:  Alb 2.6, continue supps.  Renal diet w/fluid restrictions.  8.  Hx NSVT: On Coreg 10.  Recent culture negative PD cath-related peritonitis and associated fungemia + enterococcus bacteremia - underwent PD cath removal, TDC placement and removal, temp HD cath placement  and removal, and line holiday. Completed IV unasyn, IV micafungin and cipro on 8/15. TDC not replaced cause he had functional AVF 11. Dispo - ongoing issues with safety during HD making patient unable to discharge. Will need consistent safe behavior during HD to be candidate for outpatient.  Will also need to ensure able to tolerate in chair.  If no behavior issues this week will attempt in chair next week.  At that point can initiate CLIP.  He is not a candidate for LTAC per SW. Unfortunately our options are limited to essentially hospice or waiting to see if mental status improves. His wife does not want him to go to hospice. Looking at possible out of state facility if behavior not improved.  Virgina Norfolk, PA-C [facility] 04/26/2023,2:18 PM  LOS: 45 days

## 2023-04-26 NOTE — Procedures (Addendum)
HD Note:  Some information was entered later than the data was gathered due to patient care needs. The stated time with the data is accurate.  Received patient in bed to unit.   Alert and oriented to self and did ask about current storm issues. Patient was cooperative.  Informed consent signed and in chart.   Access used: Right upper arm fistula Access issues: No issues  At about 1.5 hours into treatment, the patient began to say he needed to get off the machine.  He was encouraged and distracted until he started to believe that he was late for his job.  He became agitated and was taken off the machine.  He argued about the holding of the sites when the needles were removed, but did stay still to allow the sites to stop bleeding.  He then tried to get out the end of the bed. 2 nurses transported him back to his room.  TX duration: 1 hours 50 min   Total UF removed: 1600 ml  Hand-off given to patient's nurse.    Neda Willenbring L. Dareen Piano, RN [facility].

## 2023-04-26 NOTE — Progress Notes (Signed)
Occupational Therapy Session Note  Patient Details  Name: [name] [name] MRN: [id number] Date of Birth: 01-18-1951  Today's Date: 04/26/2023 OT Individual Time: [phone] OT Individual Time Calculation (min): 28 min    Short Term Goals: Week 6:  OT Short Term Goal 1 (Week 6): LTG=STG 2/2 ELOS  Skilled Therapeutic Interventions/Progress Updates:  Pt greeted seated in wc at nurses station. Pt agreeable to OT treatment session but stated he had to tell the boss (his nurse) that he was leaving because she told him not to go anywhere. Pt brought to therapy gym in wc and worked on attention, initiation, and reaction time with BITS activity. Pt did a great job attending to task. Trial 1   Reaction time-2.78 Trial 2 Reaction time:2.02  Progressed to trial making task using numbers 1-8 and putting them in order. Pt able to follow demonstration to connect the dots and place numbers in order. Graded task by then increasing  number to 1-25. Pt needed min cues for this task for numbers 1-11 in quiet environment. Environment became more distracting as other patients entered th gym space requiring mod cues and increased time  to finish the second half of numbers. 5 sit<>stands with marching in placed 3 sets. Pt returned to nurses station and left with alarm on, call bell in reach, and needs met.   Therapy Documentation Precautions:  Precautions Precautions: Fall Restrictions Weight Bearing Restrictions: No Pain: Pain Assessment Pain Scale: 0-10 Pain Score: 0-No pain   Therapy/Group: Individual Therapy  [name] 04/26/2023, 11:01 AM

## 2023-04-27 MED ORDER — ARIPIPRAZOLE 10 MG PO TABS
10.0000 mg | ORAL_TABLET | Freq: Every day | ORAL | Status: DC
  Administered 2023-04-27 – 2023-05-06 (×10): 10 mg via ORAL
  Filled 2023-04-27 (×11): qty 1

## 2023-04-27 MED ORDER — HEPARIN SODIUM (PORCINE) 1000 UNIT/ML DIALYSIS: 1000.0000 [IU] | INTRAMUSCULAR | Status: DC | PRN

## 2023-04-27 MED ORDER — VITAMIN C 500 MG PO TABS
500.0000 mg | ORAL_TABLET | Freq: Every day | ORAL | Status: DC
  Administered 2023-04-27 – 2023-05-07 (×10): 500 mg via ORAL
  Filled 2023-04-27 (×10): qty 1

## 2023-04-27 MED ORDER — LIDOCAINE HCL (PF) 1 % IJ SOLN: 5.0000 mL | INTRAMUSCULAR | Status: DC | PRN

## 2023-04-27 MED ORDER — ALTEPLASE 2 MG IJ SOLR: 2.0000 mg | Freq: Once | INTRAMUSCULAR | Status: DC | PRN

## 2023-04-27 MED ORDER — PENTAFLUOROPROP-TETRAFLUOROETH EX AERO: 1.0000 | INHALATION_SPRAY | CUTANEOUS | Status: DC | PRN

## 2023-04-27 MED ORDER — LIDOCAINE-PRILOCAINE 2.5-2.5 % EX CREA: 1.0000 | TOPICAL_CREAM | CUTANEOUS | Status: DC | PRN

## 2023-04-27 MED ORDER — AMLODIPINE BESYLATE 2.5 MG PO TABS
2.5000 mg | ORAL_TABLET | Freq: Every day | ORAL | Status: DC
  Administered 2023-04-27 – 2023-05-02 (×5): 2.5 mg via ORAL
  Filled 2023-04-27 (×6): qty 1

## 2023-04-27 NOTE — Progress Notes (Signed)
Speech Language Pathology Daily Session Note  Patient Details  Name: [name] [name] MRN: [id number] Date of Birth: [birth year]  Today's Date: 04/27/2023 SLP Individual Time: 1130-1156 SLP Individual Time Calculation (min): 26 min  Short Term Goals: Week 7: SLP Short Term Goal 1 (Week 7): Patient will utilize external aids to orient to time and situation given modA SLP Short Term Goal 2 (Week 7): Pt will complete functional problem solving with 60% acc with max A. SLP Short Term Goal 3 (Week 7): Pt will sustain attention to functional task for 10 minutes given mod multimodal cues. SLP Short Term Goal 4 (Week 7): Pt will answer biographical questions with 90% accuracy with mod A multimodal cues.  Skilled Therapeutic Interventions: SLP conducted skilled therapy session targeting cognitive retraining goals. Patient received from nurses station and taken to speech therapy office. When asked what patient would like to focus on today, patient expressed he's "tired of those kids games", thus calendar task was initiated. Patient sustained attention to task for 10 minutes given min multimodal cues and followed 1-step directions accurately with modA. Patient oriented to date utilizing calendar aid given modA. Patient reported he was "done" with therapy near the end of session, thus SLP assisted patient with return to nurses station. Patient rolling wheelchair independently, though slowly, and navigating obstacles adequately given extra time. Patient was left at nurses station with chair alarm set and needs in reach. SLP will continue to target goals per plan of care.       Pain Pain Assessment Pain Scale: 0-10 Pain Score: 8  Pain Location: Shoulder  Therapy/Group: Individual Therapy  [name], M.A., CCC-SLP  [name] 04/27/2023, 1:45 PM

## 2023-04-27 NOTE — Progress Notes (Signed)
[facility] Progress Note   Subjective:    Seen and examined patient at bedside. Patient's wife also at bedside. Informed patient did not do well during yesterday's HD due disruptive behavior. According to HD RN's note, he was trying to get off the machine because he believed he was late for his job. He then became agitated causing treatment to end early. Today, he reports not remembering what happened. Psychiatry consulted today. Try for HD 10/12.  Objective Vitals:   04/26/23 1609 04/26/23 1659 04/26/23 2120 04/27/23 1254  BP: 130/85 (!) 158/77 (!) 143/63 121/77  Pulse:  69 60 68  Resp: (!) 28 16 16 18   Temp:  97.6 F (36.4 C) 98.3 F (36.8 C) 98.7 F (37.1 C)  TempSrc:      SpO2:  93% 96% 100%  Weight:      Height:       Physical Exam General:WDWN male in NAD, sitting in wheelchair Heart:RRR, no mrg Lungs:+crackles in bases, nml WOB on RA Abdomen:soft, NTND Extremities:trace LE edema Dialysis Access: LU AVF +b/t   Filed Weights   04/24/23 1327 04/24/23 1738 04/26/23 1338  Weight: 91.4 kg 88.4 kg 88.8 kg    Intake/Output Summary (Last 24 hours) at 04/27/2023 1453 Last data filed at 04/27/2023 1259 Gross per 24 hour  Intake 720 ml  Output 1600 ml  Net -880 ml    Additional Objective Labs: Basic Metabolic Panel: Recent Labs  Lab 04/24/23 1342 04/26/23 1330  NA 133* 137  K 4.1 4.2  CL 94* 96*  CO2 23 24  GLUCOSE 129* 103*  BUN 60* 49*  CREATININE 10.93* 10.77*  CALCIUM 9.2 9.1  PHOS 4.9* 4.4   Liver Function Tests: Recent Labs  Lab 04/24/23 1342 04/26/23 1330  ALBUMIN 2.6* 2.5*   No results for input(s): "LIPASE", "AMYLASE" in the last 168 hours. CBC: Recent Labs  Lab 04/24/23 1342 04/26/23 1330  WBC 4.2 4.4  HGB 10.7* 10.5*  HCT 34.3* 34.4*  MCV 89.8 90.8  PLT 170 170   Blood Culture    Component Value Date/Time   SDES URINE, CATHETERIZED 04/08/2023 0915   SPECREQUEST  04/08/2023 0915    NONE Performed at [facility], [address] [place], [place] [address]    CULT >=100,000 COLONIES/mL STAPHYLOCOCCUS HAEMOLYTICUS (A) 04/08/2023 0915   REPTSTATUS 04/10/2023 FINAL 04/08/2023 0915    Cardiac Enzymes: No results for input(s): "CKTOTAL", "CKMB", "CKMBINDEX", "TROPONINI" in the last 168 hours. CBG: Recent Labs  Lab 04/21/23 0038  GLUCAP 128*   Iron Studies: No results for input(s): "IRON", "TIBC", "TRANSFERRIN", "FERRITIN" in the last 72 hours. Lab Results  Component Value Date   INR 1.0 03/08/2023   INR 1.1 03/03/2023   INR 1.2 02/18/2023   Studies/Results: No results found.  Medications:  sodium chloride 10 mL/hr at 04/18/23 1515    (feeding supplement) PROSource Plus  30 mL Oral BID BM   ARIPiprazole  10 mg Oral QHS   ascorbic acid  500 mg Oral Daily   aspirin EC  81 mg Oral Daily   atorvastatin  80 mg Oral Daily   Chlorhexidine Gluconate Cloth  6 each Topical Q0600   darbepoetin (ARANESP) injection - DIALYSIS  150 mcg Subcutaneous Q Sun-1800   feeding supplement  237 mL Oral BID WC   Gerhardt's butt cream  1 Application Topical QID   lidocaine  1 patch Transdermal Q2200   lidocaine  1 patch Transdermal Q24H   LORazepam  1  mg Oral Q T,Th,Sa-HD   mometasone-formoterol  2 puff Inhalation BID   pantoprazole  40 mg Oral BID   traZODone  50 mg Oral QHS    Dialysis Orders: Previously on [facility] thru [facility] - changed to HD - will need outpatient HD unit established prior to d/c. 4hr, 3K, EDW ~96, L AVF   Assessment/Plan: 1. Debility - in CIR. Appropriateness for CIR nearing its end per NP.   2. Encephalopathy/agitation: Waxing/waning agitation, dementia, sundowning. No acute findings on imaging. Neruo and Psych consulting to help improve behavior. Requesting better documentation on behavior with HD. Reviewed HD RN's note from 10/10: noted patient again became agitated causing treatment to end early.  Did not improve with wife at bedside previously. Patient requires one  on one HD for his safety, which is not possible in outpatient setting.  Use of IV Ativan can not be done in OP HD units.   IR will not place catheter, agitation is not an indication to place catheter.  Considered switching to PD so patient can complete dialysis at home, but vascular surgery felt he was high risk for PD catheter placement.  Meds being adjusted per primary team/psychiatry team.   3. ESRD: Transitioned from PD to HD recently on TTS schedule. Using old AVF which is still functional. Family would prefer TTS HD. Coordinate with rehab team so they know to premedicate before HD, but he is not always willing to take his medications. Using emla cream to decrease pain with cannulation. Next HD 10/12. 4.Hypertension/volume: BP in goal. Midodrine 5mg  stopped. UF as tolerated.  5. Anemia: last Hgb 10.7-on Aranesp weekly. Last Tsat 38% with ferritin 2175.  No iron.  6. Metabolic bone disease: CorrCa and phos in goal. No VDRA.  7.  Nutrition:  Alb 2.6, continue supps.  Renal diet w/fluid restrictions.  8.  Hx NSVT: On Coreg 10.  Recent culture negative PD cath-related peritonitis and associated fungemia + enterococcus bacteremia - underwent PD cath removal, TDC placement and removal, temp HD cath placement  and removal, and line holiday. Completed IV unasyn, IV micafungin and cipro on 8/15. TDC not replaced cause he had functional AVF 11. Dispo - ongoing issues with safety during HD making patient unable to discharge. Will need consistent safe behavior during HD to be candidate for outpatient.  Will also need to ensure able to tolerate in chair.  If no behavior issues this week will attempt in chair next week.  At that point can initiate CLIP.  He is not a candidate for LTAC per SW. Unfortunately our options are limited to essentially hospice or waiting to see if mental status improves. His wife does not want him to go to hospice. Looking at possible out of state facility if behavior not  improved.  [name], NP [facility] 04/27/2023,2:53 PM  LOS: 46 days

## 2023-04-27 NOTE — Progress Notes (Signed)
PROGRESS NOTE   Subjective/Complaints  Tried to climb out of bed yesterday with dialysis; no PRNs used (except scheduled pre-dialysis), tolerated 1.5 hours. No pain. BP 140s-150s overnight. Labs stable, NA improved.   Patient denies any issues today.  No further pain in his ankles.  Eating well, sleeping well.  Cognitively continues to gradually improve.  When talking about CPAP, he states that he does use this at home, with improvement in his sleep although that is difficult to tolerate somewhat.  Wife corroborates that current nasal pillow "blows water in his face too much".  She also believes that the hospital told her they could not use home devices.  Had a long discussion with wife today regarding dispo planning.  Current options appear to be SNF with HD compatibility versus discharge to home and returning to ER for HD.  She has considerable concerns with driving outside of [place], and does not want him going to an out-of-state SNF, although she understands that this may be needed.  She states she wants to take him home if able, can provide 24/7 assistance and supervision, but patient will need to be walker level as home will not accommodate a wheelchair.  Goal would be to eventually transition back to [facility] in [place] for outpatient HD.  She is agreeable to creating a realistic goal with the team, and finalizing dispo plans by teams on Tuesday.  Answered all questions she had regarding his patient's care.  ROS: Limited by cognition.  Denies fevers, chills, N/V, abdominal pain, constipation, diarrhea, SOB, cough, chest pain, new weakness or paraesthesias.  Pertinent positives per HPI above.  Objective:   No results found. Recent Labs    04/24/23 1342 04/26/23 1330  WBC 4.2 4.4  HGB 10.7* 10.5*  HCT 34.3* 34.4*  PLT 170 170        Recent Labs    04/24/23 1342 04/26/23 1330  NA 133* 137  K 4.1 4.2  CL 94* 96*   CO2 23 24  GLUCOSE 129* 103*  BUN 60* 49*  CREATININE 10.93* 10.77*  CALCIUM 9.2 9.1         Intake/Output Summary (Last 24 hours) at 04/27/2023 1022 Last data filed at 04/27/2023 0909 Gross per 24 hour  Intake 240 ml  Output 1600 ml  Net -1360 ml           Physical Exam: Vital Signs Blood pressure (!) 143/63, pulse 60, temperature 98.3 F (36.8 C), resp. rate 16, height 5\' 4"  (1.626 m), weight 88.8 kg, SpO2 96%.   Constitutional: No distress . Vital signs reviewed.  Sitting up, in wheelchair at nurses station. HEENT: NCAT, EOMI, oral membranes moist; bilateral puffiness under eyes, stable from prior exams.  Mild tearing of right eye. Cardiovascular: RRR without murmur. No JVD .  No peripheral edema. Respiratory/Chest: CTA Bilaterally without wheezes or rales. Normal effort    GI/Abdomen: BS +, non-tender, non-distended Ext: no clubbing, cyanosis; no appreciable discoloration of bilateral toes. Psych: Pleasant, cooperative, appropriate mood and affect. Skin: No evidence of breakdown, no evidence of rash. RUE  IV intact  Neurologic: AAO to self, [facility], year is 2024; can get October with options -  significant improvement from prior exams  + Cognitive deficits -mildly lethargic, poor concentration, poor carryover cranial nerves II through XII intact, Moving all 4 extremities antigravity  and against resistance.  Bilateral upper extremity tremors, mild today.  Wax and wane.   Musculoskeletal: No obvious deformities.   No joint swelling. No ttp bilateral ankles, calves.    Assessment/Plan: 1. Functional deficits which require 15 hr per week of interdisciplinary therapy in a comprehensive inpatient rehab setting. Physiatrist is providing close team supervision and 24 hour management of active medical problems listed below. Physiatrist and rehab team continue to assess barriers to discharge/monitor patient progress toward functional and medical goals  Care  Tool:  Bathing    Body parts bathed by patient: Right arm, Left arm, Chest, Abdomen, Front perineal area, Right upper leg, Buttocks, Left upper leg, Right lower leg, Left lower leg, Face   Body parts bathed by helper: Right arm, Left arm, Chest, Abdomen, Front perineal area, Buttocks, Right upper leg, Left upper leg, Right lower leg, Left lower leg, Face     Bathing assist Assist Level: Total Assistance - Patient < 25%     Upper Body Dressing/Undressing Upper body dressing   What is the patient wearing?: Pull over shirt    Upper body assist Assist Level: Total Assistance - Patient < 25%    Lower Body Dressing/Undressing      Lower body dressing      What is the patient wearing?: Underwear/pull up, Pants     Lower body assist Assist for lower body dressing: Total Assistance - Patient < 25%     Toileting Toileting    Toileting assist Assist for toileting: Maximal Assistance - Patient 25 - 49%     Transfers Chair/bed transfer  Transfers assist     Chair/bed transfer assist level: Contact Guard/Touching assist     Locomotion Ambulation   Ambulation assist      Assist level: 2 helpers Assistive device: Walker-rolling Max distance: 90'   Walk 10 feet activity   Assist     Assist level: 2 helpers Assistive device: Hand held assist   Walk 50 feet activity   Assist Walk 50 feet with 2 turns activity did not occur: Safety/medical concerns  Assist level: Minimal Assistance - Patient > 75% Assistive device: No Device    Walk 150 feet activity   Assist Walk 150 feet activity did not occur: Safety/medical concerns  Assist level: Minimal Assistance - Patient > 75% Assistive device: No Device    Walk 10 feet on uneven surface  activity   Assist Walk 10 feet on uneven surfaces activity did not occur: Safety/medical concerns         Wheelchair     Assist Is the patient using a wheelchair?: Yes Type of Wheelchair: Manual     Wheelchair assist level: Dependent - Patient 0%      Wheelchair 50 feet with 2 turns activity    Assist        Assist Level: Dependent - Patient 0%   Wheelchair 150 feet activity     Assist      Assist Level: Dependent - Patient 0%   Blood pressure (!) 143/63, pulse 60, temperature 98.3 F (36.8 C), resp. rate 16, height 5\' 4"  (1.626 m), weight 88.8 kg, SpO2 96%.  Medical Problem List and Plan: 1. Functional deficits secondary to metabolic encephalopathy due to sepsis secondary to catheter peritonitis and hypoxemia              -  patient may not shower             -ELOS/Goals: 16-18 days, PT/OT Sup, SLP min A -   - Per wife, patient will need to be SPV/CGA level with rolling walker for return home   - 10/11: Long discussion with wife, by 10/15 we will need to dispo plan home with [facility] and ER for HD vs. to SNF with HD capabilities.  Currently, no options for home or OP dialysis.  Continuing to work on improving behaviors so that he may qualify for outpatient dialysis.  Not considering hospice/palliative at this time.            -Continue CIR therapies including PT, OT, and SLP   -[facility] -- encephalopathy/aggitation has developed since approximately 8/22, with decline earlier following COVID infection per wife   -Therapy QD schedule  - Con't CIR PT, OT and SLP  - Patient was seen by palliative care, changed to DNR status -Marcelino Duster with palliative continuing to follow  -Tried wife at bedside with dialysis on 10-3, patient remained agitated and pulling at lines, per nephrology not a candidate for outpatient hemodialysis.     10/6 vascular is not recommending conversion to PD.   10-7: Palliative signed off  2.  Antithrombotics: -DVT/anticoagulation:  Pharmaceutical: Heparin, 03/23/23 changed to Lovenox -  consistent refusal -  started on Eliquis 2.5 mg BID - Dced 9/12 per cardiology "With patient's chronic anemia and agitation/recent falls, appears  that risk of OAC>benefits at this time... ". SCDs at bedtime added 10/4.              -antiplatelet therapy: aspirin and Plavix --> ASA alone per cardiology 9/11   3. Pain Management:              -Lidoderm patches to back, Tylenol every 4 hours as needed's, muscle rub as needed, and oxycodone 2.5 mg 3 times daily as needed  -Elavil 25 mg at bedtime for nerve pain and sleep 8/27 -weaned off 9-7 due to oversedation  - No use of PRNs or general complaints of pain since 9-29  4.  Lethargy/ intermittent agitation /insomnia: Persistent despite interventions  -Weaned off of veil bed, remains on telesitter for safety.  Appears somewhat better with wife at bedside and when seated at the station.             -Admitted on Seroquel 50 mg TID -weaned off             -clonazepam 0.5 mg TID prn -transition to lorazepam 0.5 mg daily p.o. or IV as needed for anxiety, agitation -10-4 psychiatry premedicating dialysis with 0.5 mg ativan -Currently have Atarax first-line and Ativan 0.5 mg scheduled predialysis for agitation; redirect and avoid overstimulation as much as possible.   - Avoid increasing sedating medications as much as possible, as there is a direct effect on patient's sleep, confusion, and agitation up to a day after receiving PRN antipsychotics and Ativan.              - Dced Geodon 10 mg IM q 12 hours prn d/t  switched to Abilify per psychiatry 9-17             -8-28 added depakote 125 mg q8h for ongoing aggitation, mood stabilization -discontinued 9-2, resumed 9-21, increased 9-23 - level 18 subtherapeutic increased to 250 mg Q12h 10/4 -> psych changing to long-acting to 250 mg nightly 10-6; repeat levels 10-8 - <10  - 10-8: D/w  psych adjusting vs. Dcing depakote; in agreement with plan, Depakote Dc'd 10/9   - 9-17 Abilify started per psychiatry, increased to 7.5 mg 9-27  - 9-24 memantine 5 mg daily started per psychiatry, discontinued 10-3 per family request due to increasing confusion and  lethargy -Psychiatry following, appreciate their assistance and recommendations in managing this patient - Responsive to low stimulating environment, music, and redirection.  Remains intermittently agitated, especially with dialysis and at nighttime, but is significantly improving over the weeks.   10/8: Poor sleep overnight, remains pleasant and cooperative today and appreciate overall slight improvement in behaviors this week. 10/9: Neurology noting pre-existing cognitive deficits documented on the outpatient side; while these are significantly worse, may be beneficial to reach out to other family members regarding his prior cognitive baseline.  Will also look into family bringing in CPAP.  5. Hypoactive delirium/metabolic encephalopathy: This patient is not capable of making decisions on his own behalf.  Persistent despite significant efforts to workup/treat.  -  8/29: EEG negative, 8-30: MRI showing chronic microvascular changes, otherwise normal.  Multiple lab workups for encephalopathy with CMP, ammonia, CBC negative except for low VPA.   -  10-2: Lethargic, jerking , BG 70s, improved with POs.  Resume blood glucose check twice daily. Repeat VPA levels (18).  CMP CBC look okay.    -10/8: Neuro also to eval as above; appreciate their assistance and recommendations. -recommending discontinuation of Atarax and Ativan; will consider if he is able to tolerate transition to dialysis chair 10/12.  Appreciate their assistance and recommendations.  Folate, B12, and TSH normal.   6. Skin/Wound Care: Routine skin care checks             -sacral skin off-loading/protection   7. Fluids/Electrolytes/Nutrition: Strict Is and Os and follow-up chemistries per nephrology             - renal diet/ 1200 cc FR  - Renvela 800 mg 3 times daily for hyperphosphatemia added per nephrology 8-27   - Management per nephrology, labs appear approximately stable, p.o. intakes are appropriate at this time but wax and wane  with lethargy   - 10/2-10: Mild hyponatremia 134-133 recently; is not worsening, has dipped into this range before, no apparent cognitive effects. Continue management with dialysis. 10/10 - resolved    8: Hypotension/Hypertension: monitor TID and prn             -continue midodrine as needed during dialysis  9-1: add midodrine 5 mg tid and monitor.  DC Coreg as heart rate has been low to normal; may resume at low-dose once blood pressure improved.  9/13 DC midodrine   - Intermittent hypertension into the 150s, is higher on off dialysis days.  Would maintain higher perfusion pressure given microvascular changes seen on MRI.  No medication adjustments for now.  10-7 - 10/9 well-controlled, with intermittent transient elevations.  10/11: Add amlodipine 2.5 mg daily for consistent mild elevations    04/26/2023    9:20 PM 04/26/2023    4:59 PM 04/26/2023    4:09 PM  Vitals with BMI  Systolic 143 158 161  Diastolic 63 77 85  Pulse 60 69      9: Hyperlipidemia: continue statin   10: CAD s/p CABG 2023: on DAPT and statin.  - troponin elevated but stable, monitor 8/27   11: DM-2: A1c = 6.3% (at home on Lantus 22 units at bedtime, Tradjenta 5 mg daily)        -monitor PO intake             -  continue SSI (0-6 units) does not appear to be requiring coverage   - 9/6 DC SSI, change CBGs to daily   - 10-2: Hypoglycemic today, 70s, improved with p.o. intakes, 88 at recheck.  Likely due to poor p.o. intakes last few days.  Start CBG checks twice daily  - 10-4: Patient agitated with CBG checks, refusing.  Moved to as needed for lethargy, AM   12: ESRD: HD on Tues, Thurs, Sat -nephrology made aware  -Regularly have to hold dialysis or stopped early due to agitated behaviors, which is ongoing.  Recently has been able to receive all or part of most dialysis sessions.  -Have tried premedicating patient and providing as needed medications with Haldol, Ativan as available on dialysis unit; measures  helped temporarily but overall do not seem to affect treatment  -Wife at bedside for dialysis 10-3, did not improve behaviors, per nephrology patient is not a candidate for outpatient dialysis.     -10/5-6 see discussion above. Wife tries to help in HD. However, behaviors continue to make HD difficult.  -Unfortunately, vascular not offering conversion to PD.  Will discuss possible options with nephrology.    - 10/8:  Nephro to proved detailed notes on dialysis behaviors this week to target; goal to be OP HD candidate -agitated 10-10 session, continue to work on behaviors  13: COPD/OSA; chronic home 2L O2 (Symbicort, Ventolin, prednisone 40 mg daily at home)             -continue symbicort  -Added home oxygen back 8-27  -Satting well on room air, oxygen not required   -10/11: Patient's wife reports home CPAP machine not usable; RT consult to resume nighttime CPAP   14: GERD: continue Protonix   15: Non-sustained VT/pAF/bradycardia: resolved. per cardiology, Dr. Algie Coffer: -continue carvedilol 6.25 mg BID; consider increasing to 12.5 mg BID before starting low dose amiodarone  -consider Eliquis and discontinue asa and Plavix in one week if no active GI bleeding and stable hemoglobin from 8/24 -hemoglobin has remained stable but very low around 7, no recurrent A-fib, monitor for now given agitation/fall 8-31 and consider initiating Eliquis when safe -Some intermittent recurrent bradycardia while at inpatient rehab, improved with discontinuation of Elavil and Geodon -9/11 EKG for bradycardia episode today, 2nd degree A-V block mobitz type 1.  Continue to hold beta blocker,9/11 will restart eliquis at 2.5 mg BID dose. Will consult cardiology regarding need for ASA/plavix and coreg.  Dr. Elease Hashimoto does not appear convinced that he has A-fib 9/12 per cardiology, DAPT transition to daily aspirin 81 only and Eliquis was discontinued - Heart rate currently well-controlled, hypertension as above, continuing to  hold Eliquis due to falls/poor safety awareness, starting SCDs as above   16: HFrEF: Daily weights fluctuating significantly d/t dialysis. Monitor for signs of overload. No current concerns.    17: Liver nodularity on CT scan: LFTs reported to be normal. Avoid hepatotoxic medications    -Abdominal ultrasound earlier this admission with minimal ascites  -Ammonia levels have remained normal on multiple checks   18: Epistaxis - resolved             -previously improved with flonase and saline spray   19: Morbid Obesity. Dietary counseling Body mass index is 33.6 kg/m.    20. Hematuria - resolved  9/22- required in/out to check U/A- having new hematuria- nursing to keep an eye- since also bleeding from penis a little- monitor   9/23 no wBCs  on UA, follow  urine culture-Staphylococcus  hemolyticus-discussed with pharmacy and ID pharmacy.  Think this is more likely contaminant  9/25 discuss with nephrology about possible treatment or other evaluation options  Spoke with Dr. Ronalee Belts, most likely contaminant.  Recommended treating only if symptomatic.  If hematuria continues consider urology input  9/27 urine has improved to clear yellow   21.  Vitamin C deficiency.  Aggressive repletion per psychiatry, 100 mg IV x 6+ 500 mg p.o. daily.  Feel this may help his cognition.  -04-22-09: Am noting some slightly improved attention, memory on gross exam since IV course; continue PO supplementation  23.  Chest pain a.m. 10-3.  EKG unchanged, self resolved.  -control bp as possible  24. Bilateral ankle pain. Changing locations, no known trauma, no instability, edema, or point tenderness. ?Peripheral neuropathy related; may benefit from Qutenza but would not add sedating medications for tx at this time.   -Resolved  LOS: 46 days A FACE TO FACE EVALUATION WAS PERFORMED  Angelina Sheriff 04/27/2023, 10:22 AM

## 2023-04-27 NOTE — Consult Note (Signed)
[facility] Face-to-Face Psychiatry Consult    Reason for Consult:  medication adjustment - lethargy, agitation, delirium  Referring Physician:  Shearon Stalls   Patient Identification: [name] [name] MRN:  [id number] Principal Diagnosis: Acute metabolic encephalopathy Diagnosis:  Principal Problem:   Acute metabolic encephalopathy Active Problems:   Type 2 diabetes mellitus with chronic kidney disease on chronic dialysis, with long-term current use of insulin (HCC)   HFrEF (heart failure with reduced ejection fraction) (HCC)   Morbid obesity (HCC)   Encephalopathy   Epistaxis   PAF (paroxysmal atrial fibrillation) (HCC)   Long term (current) use of insulin (HCC)   ESRD on dialysis (HCC)   Debility   Agitation   Hematuria   Total Time spent with patient: 15 minutes  Subjective:   [name] [name] is a 72 y.o. male admitted medically to the hospitalists service on 02/15/23 for confusion and SOB in the context of a recent COVID infection and admitted to acute care on 03/12/2023  4:25 PM for acute metabolic encephalopathy assumed due to recent COVID infection.  He has no past psychiatric diagnosis and has a past medical history of COPD, IDT2DM, GERD, and ESRD. Psychiatry was consulted for medication adjustment - lethargy, agitation, delirium Dr. Gasper Sells aware  by Dr. Shearon Stalls.   HPI:    Per nursing, pt agitated during HD yesterday, up at night around 1a-3a agitated, restless.   Per HD nursing from yesterday: At about 1.5 hours into treatment, the patient began to say he needed to get off the machine.  He was encouraged and distracted until he started to believe that he was late for his job.  He became agitated and was taken off the machine.  He argued about the holding of the sites when the needles were removed, but did stay still to allow the sites to stop bleeding.  He then tried to get out the end of the bed. 2 nurses transported him back to his room.  On exam today, the pt is A&O x1 (self only). He  talks about how the young people and CEOs are messing everything up and buying this building and the one next door. He says he slept well overnight (per nurse, he did not). He states that his mood is "fine" and not sad or depressed. He states that he missed HD yesterday completely and feels bad about that (he did NOT, he did have to stop HD early yesterday however). Denies SI, HI, AH, VH.   Per 10/8 psychiatry note - pt had significant improvement earlier this week per wife. Pt had been taken off of depakote and abilify seemed to have been working well.     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 Legal Hx: Wife states patient currently has no legal issues Living Situation: Lives with wife at home Spiritual Hx: Patient normally attends church every Sunday and participates in Sunday school Access to weapons: Wife states that there are currently guns in  the home, but she plans to have them locked up prior to the patient returning home   Substance History Tobacco use: Wife states patient does not use tobacco Alcohol use: Wife states patient does not use alcohol Drug use: Wife states patient does not use drugs  Risk to Self:  denies Risk to Others:  denies  Past Medical History:  Past Medical History:  Diagnosis Date   Anemia    Arthritis    Asthma    Bell palsy    CAD (coronary artery disease)    a. 2014 MV: abnl  w/ infap ischemia; b. 03/2013 Cath: aneurysmal bleb in the LAD w/ otw nonobs dzs-->Med Rx.   Chronic back pain    Chronic knee pain    a. 09/2015 s/p R TKA.   Chronic pain    Chronic shoulder pain    Chronic sinusitis    COPD (chronic obstructive pulmonary disease) (HCC)    Diabetes mellitus without complication (HCC)    type II    ESRD on peritoneal dialysis (HCC)    on peritoneal dialysis, [facility]   Essential hypertension    GERD (gastroesophageal reflux disease)    Gout    Gout    Hepatomegaly    noted on noncontrast CT 2015   History of hiatal hernia    Hyperlipidemia    Lateral meniscus tear    Obesity    Truncal   Obstructive sleep apnea    does not use cpap    On home oxygen therapy    uses 2l when is going somewhere per patient    PUD (peptic ulcer disease)    remote, reports f/u EGD about 8 years ago unremarkable    Reactive airway disease    related to exposure to chemical during 9/11   Sinusitis    Vitamin D deficiency     Past Surgical History:  Procedure Laterality Date   A/V FISTULAGRAM N/A 02/28/2023   Procedure: A/V Fistulagram;  Surgeon: Victorino Sparrow, MD;  Location: [facility] INVASIVE CV LAB;  Service: Cardiovascular;  Laterality: N/A;   ASAD LT SHOULDER  12/15/2008   left shoulder   AV FISTULA PLACEMENT Left 08/09/2016   Procedure: BRACHIOCEPHALIC ARTERIOVENOUS (AV) FISTULA CREATION LEFT ARM;  Surgeon: Sherren Kerns, MD;  Location: [facility] OR;  Service: Vascular;  Laterality: Left;   CAPD INSERTION N/A 10/07/2018   Procedure: LAPAROSCOPIC PERITONEAL CATHETER PLACEMENT;  Surgeon: Rodman Pickle, MD;  Location: [facility] ORS;  Service: General;  Laterality: N/A;   CATARACT EXTRACTION W/PHACO Left 03/28/2016   Procedure: CATARACT EXTRACTION PHACO AND INTRAOCULAR LENS PLACEMENT LEFT EYE;  Surgeon: Jethro Bolus, MD;  Location: [facility] ORS;  Service: Ophthalmology;  Laterality: Left;  CDE: 4.77   CATARACT EXTRACTION W/PHACO Right 04/11/2016   Procedure:  CATARACT EXTRACTION PHACO AND INTRAOCULAR LENS PLACEMENT RIGHT EYE; CDE:  4.74;  Surgeon: Jethro Bolus, MD;  Location: [facility] ORS;  Service: Ophthalmology;  Laterality: Right;   COLONOSCOPY  10/15/2008   Fields: Rectal polyp obliterated, not retrieved, hemorrhoids, single ascending colon diverticulum near the CV. Next colonoscopy April 2020   COLONOSCOPY N/A 12/25/2014   SLF: 1. Colorectal polyps (2) removed 2. Small internal hemorrhoids 3. the left colon is severely redundant. hyperplastic polyps   CORONARY ARTERY BYPASS GRAFT N/A 06/05/2022   Procedure: OFF PUMP CORONARY ARTERY BYPASS GRAFTING (CABG) X 2 BYPASSES USING LEFT INTERNAL MAMMARY ARTERY AND RIGHT LEG GREATER SAPHENOUS VEIN HARVESTED ENDOSCOPICALLY;  Surgeon: Corliss Skains, MD;  Location: [facility] OR;  Service: Open Heart Surgery;  Laterality: N/A;   CORONARY STENT INTERVENTION N/A 07/25/2021   Procedure: CORONARY STENT INTERVENTION;  Surgeon: Tonny Bollman, MD;  Location: [facility] INVASIVE CV LAB;  Service: Cardiovascular;  Laterality: N/A;   CORONARY STENT INTERVENTION N/A 12/26/2021   Procedure: CORONARY STENT INTERVENTION;  Surgeon: Yvonne Kendall, MD;  Location: [facility] INVASIVE CV LAB;  Service: Cardiovascular;  Laterality: N/A;   CORONARY STENT INTERVENTION N/A 01/20/2022   Procedure: CORONARY STENT INTERVENTION;  Surgeon: Tonny Bollman, MD;  Location: [facility] INVASIVE CV LAB;  Service: Cardiovascular;  Laterality: N/A;   DOPPLER ECHOCARDIOGRAPHY     ESOPHAGOGASTRODUODENOSCOPY N/A 12/25/2014   SLF: 1. Anemia most likely due to CRI, gastritis, gastric polyps 2. Moderate non-erosive gastriits and mild duodenitis.  3.TWo large gstric polyps removed.    EYE SURGERY  12/22/2010   tear duct probing-[place]   FOREIGN BODY REMOVAL  03/29/2011   Procedure: REMOVAL FOREIGN BODY EXTREMITY;  Surgeon: Fuller Canada, MD;  Location: [facility]S;  Service: Orthopedics;  Laterality: Right;  Removal Foreign Body Right Thumb   IR FLUORO GUIDE CV LINE RIGHT   08/06/2018   IR FLUORO GUIDE CV LINE RIGHT  02/17/2023   IR US GUIDE VASC ACCESS RIGHT  08/06/2018   IR US GUIDE VASC ACCESS RIGHT  02/17/2023   KNEE ARTHROSCOPY  10/16/2007   left   KNEE ARTHROSCOPY WITH LATERAL MENISECTOMY Right 10/14/2015   Procedure: LEFT KNEE ARTHROSCOPY WITH PARTIAL LATERAL MENISECTOMY;  Surgeon: Vickki Hearing, MD;  Location: [facility];  Service: Orthopedics;  Laterality: Right;   LEFT HEART CATH AND CORONARY ANGIOGRAPHY N/A 07/25/2021   Procedure: LEFT HEART CATH AND CORONARY ANGIOGRAPHY;  Surgeon: Tonny Bollman, MD;  Location: [facility] INVASIVE CV LAB;  Service: Cardiovascular;  Laterality: N/A;   LEFT HEART CATH AND CORONARY ANGIOGRAPHY N/A 12/26/2021   Procedure: LEFT HEART CATH AND CORONARY ANGIOGRAPHY;  Surgeon: Yvonne Kendall, MD;  Location: [facility] INVASIVE CV LAB;  Service: Cardiovascular;  Laterality: N/A;   LEFT HEART CATH AND CORONARY ANGIOGRAPHY N/A 01/20/2022   Procedure: LEFT HEART CATH AND CORONARY ANGIOGRAPHY;  Surgeon: Tonny Bollman, MD;  Location: [facility] INVASIVE CV LAB;  Service: Cardiovascular;  Laterality: N/A;   LEFT HEART CATHETERIZATION WITH CORONARY ANGIOGRAM N/A 03/28/2013   Procedure: LEFT HEART CATHETERIZATION WITH CORONARY ANGIOGRAM;  Surgeon: Marykay Lex, MD;  Location: [facility] CATH LAB;  Service: Cardiovascular;  Laterality: N/A;   NM MYOVIEW LTD     PENILE PROSTHESIS IMPLANT N/A 08/16/2015   Procedure: PENILE PROTHESIS INFLATABLE, three piece, Excisional biopsy of Penile ulcer, Penile molding;  Surgeon: Jethro Bolus, MD;  Location: [facility] ORS;  Service: Urology;  Laterality: N/A;   PENILE PROSTHESIS IMPLANT N/A 12/24/2017   Procedure: REMOVAL AND  REPLACEMENT  COLOPLAST PENILE PROSTHESIS;  Surgeon: Crista Elliot, MD;  Location: [facility] ORS;  Service: Urology;  Laterality: N/A;   PERIPHERAL VASCULAR BALLOON ANGIOPLASTY  02/28/2023   Procedure: PERIPHERAL VASCULAR BALLOON ANGIOPLASTY;  Surgeon: Victorino Sparrow, MD;  Location: [facility] INVASIVE CV LAB;  Service:  Cardiovascular;;   PORT-A-CATH REMOVAL N/A 02/19/2023   Procedure: REMOVAL OF PERITONEAL DIALYSIS CATHETER;  Surgeon: Victorino Sparrow, MD;  Location: [facility] OR;  Service: Vascular;  Laterality: N/A;   QUADRICEPS TENDON REPAIR  07/21/2011   Procedure: REPAIR QUADRICEP TENDON;  Surgeon: Fuller Canada, MD;  Location: [facility];  Service: Orthopedics;  Laterality: Right;   RIGHT/LEFT HEART CATH AND CORONARY ANGIOGRAPHY N/A 05/30/2022   Procedure: RIGHT/LEFT HEART CATH AND CORONARY ANGIOGRAPHY;  Surgeon: Eldridge Dace,  Donnie Coffin, MD;  Location: [facility] INVASIVE CV LAB;  Service: Cardiovascular;  Laterality: N/A;   TEE WITHOUT CARDIOVERSION N/A 06/05/2022   Procedure: TRANSESOPHAGEAL ECHOCARDIOGRAM (TEE);  Surgeon: Corliss Skains, MD;  Location: [facility] OR;  Service: Open Heart Surgery;  Laterality: N/A;   TOENAIL EXCISION     removed x2-bilateral   UMBILICAL HERNIA REPAIR  07/17/2005   [place]   Family History:  Family History  Problem Relation Age of Onset   Hypertension Mother        MI   Cancer Mother        breast    Diabetes Mother    Diabetes Father    Hypertension Father    Hypertension Sister    Diabetes Sister    Arthritis Other    Asthma Other    Lung disease Other    Anesthesia problems Neg Hx    Hypotension Neg Hx    Malignant hyperthermia Neg Hx    Pseudochol deficiency Neg Hx    Colon cancer Neg Hx     Social History:  Social History   Substance and Sexual Activity  Alcohol Use Not Currently   Comment: occasionally     Social History   Substance and Sexual Activity  Drug Use Yes   Types: Marijuana   Comment: cocaine- last time used- 11/24/2017 , marijuana-     Social History   Socioeconomic History   Marital status: Married    Spouse name: Not on file   Number of children: 2   Years of education: 12th grade   Highest education level: Not on file  Occupational History   Occupation: disabled   Occupation:      Associate Professor: UNEMPLOYED  Tobacco Use   Smoking status:  Former    Current packs/day: 0.00    Average packs/day: 1 pack/day for 25.0 years (25.0 ttl pk-yrs)    Types: Cigarettes    Start date: 03/27/1985    Quit date: 03/27/2010    Years since quitting: 13.0   Smokeless tobacco: Never   Tobacco comments:    Quit x 7 years  Vaping Use   Vaping status: Never Used  Substance and Sexual Activity   Alcohol use: Not Currently    Comment: occasionally   Drug use: Yes    Types: Marijuana    Comment: cocaine- last time used- 11/24/2017 , marijuana-    Sexual activity: Yes  Other Topics Concern   Not on file  Social History Narrative   He quit smoking in 2010. 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 He developed pulmonary problems, became disabled because of lower airway disease in 2009.       WATCHES BASKETBALL. HIS TEAM IS [employer].   Social Determinants of Health   Financial Resource Strain: Low Risk  (03/05/2023)   Received from [facility]   Overall Financial Resource Strain (CARDIA)    Difficulty of Paying Living Expenses: Not hard at all  Food Insecurity: No Food Insecurity (03/05/2023)   Received from [facility]   Hunger Vital Sign    Worried About Running Out of Food in the Last Year: Never true    Ran Out of Food in the Last Year: Never true  Transportation Needs: No Transportation Needs (03/12/2023)   Received from [facility]   SM SDOH Transportation Source    Has lack of transportation kept you from medical appointments or from getting medications?: No    Has lack of  transportation kept you from meetings, work, or from getting things needed for daily living?: No  Physical Activity: Sufficiently Active (12/29/2021)   Exercise Vital Sign    Days of Exercise per Week: 3 days    Minutes of Exercise per Session: 60 min  Stress: Patient Declined (03/12/2023)   Received from [facility]   [employer] of Occupational Health - Occupational Stress Questionnaire    Feeling of  Stress : Patient declined  Social Connections: Moderately Integrated (03/05/2023)   Received from [facility]   Social Connection and Isolation Panel [NHANES]    Frequency of Communication with Friends and Family: More than three times a week    Frequency of Social Gatherings with Friends and Family: More than three times a week    Attends Religious Services: More than 4 times per year    Active Member of [employer] or Organizations: No    Attends Banker Meetings: Never    Marital Status: Married   Additional Social History:    Allergies:   Allergies  Allergen Reactions   Opana [Oxymorphone Hcl] Itching   Tramadol Itching    Labs:  Results for orders placed or performed during the hospital encounter of 03/12/23 (from the past 48 hour(s))  Renal function panel     Status: Abnormal   Collection Time: 04/26/23  1:30 PM  Result Value Ref Range   Sodium 137 135 - 145 mmol/L   Potassium 4.2 3.5 - 5.1 mmol/L   Chloride 96 (L) 98 - 111 mmol/L   CO2 24 22 - 32 mmol/L   Glucose, Bld 103 (H) 70 - 99 mg/dL    Comment: Glucose reference range applies only to samples taken after fasting for at least 8 hours.   BUN 49 (H) 8 - 23 mg/dL   Creatinine, Ser 16.10 (H) 0.61 - 1.24 mg/dL   Calcium 9.1 8.9 - 96.0 mg/dL   Phosphorus 4.4 2.5 - 4.6 mg/dL   Albumin 2.5 (L) 3.5 - 5.0 g/dL   GFR, Estimated 5 (L) >60 mL/min    Comment: (NOTE) Calculated using the CKD-EPI Creatinine Equation (2021)    Anion gap 17 (H) 5 - 15    Comment: Performed at [facility], [address] [place], [place] [address]  CBC     Status: Abnormal   Collection Time: 04/26/23  1:30 PM  Result Value Ref Range   WBC 4.4 4.0 - 10.5 K/uL   RBC 3.79 (L) 4.22 - 5.81 MIL/uL   Hemoglobin 10.5 (L) 13.0 - 17.0 g/dL   HCT 81.1 (L) 91.4 - 78.2 %   MCV 90.8 80.0 - 100.0 fL   MCH 27.7 26.0 - 34.0 pg   MCHC 30.5 30.0 - 36.0 g/dL   RDW 95.6 (H) 21.3 - 08.6 %   Platelets 170 150 - 400 K/uL   nRBC 0.0 0.0 - 0.2  %    Comment: Performed at [facility], [address], [place], [place] [address]    Current Facility-Administered Medications  Medication Dose Route Frequency Provider Last Rate Last Admin   (feeding supplement) PROSource Plus liquid 30 mL  30 mL Oral BID BM Julien Nordmann, PA-C   30 mL at 04/25/23 0953   0.9 %  sodium chloride infusion   Intravenous PRN Elijah Birk C, DO 10 mL/hr at 04/18/23 1515 New Bag at 04/18/23 1515   acetaminophen (TYLENOL) tablet 325-650 mg  325-650 mg Oral Q4H PRN Milinda Antis, PA-C  650 mg at 04/12/23 2036   ARIPiprazole (ABILIFY) tablet 10 mg  10 mg Oral QHS Hanny Elsberry, Harrold Donath, MD       [START ON 05/02/2023] ascorbic acid (VITAMIN C) tablet 500 mg  500 mg Oral Daily Angelina Sheriff, DO       aspirin EC tablet 81 mg  81 mg Oral Daily Milinda Antis, PA-C   81 mg at 04/27/23 4332   atorvastatin (LIPITOR) tablet 80 mg  80 mg Oral Daily Milinda Antis, PA-C   80 mg at 04/27/23 9518   bisacodyl (DULCOLAX) EC tablet 5 mg  5 mg Oral Daily PRN Milinda Antis, PA-C       Chlorhexidine Gluconate Cloth 2 % PADS 6 each  6 each Topical Q0600 Penninger, Lillia Abed, PA       Darbepoetin Alfa (ARANESP) injection 150 mcg  150 mcg Subcutaneous Q Sun-1800 Paytes, Austin A, RPH   150 mcg at 04/22/23 1959   dextrose 5 % 100 mL with ascorbic acid (VITAMIN C) 200 mg infusion   Intravenous Q T,Th,Sat-1800 Elijah Birk C, DO 100 mL/hr at 04/22/23 0535 Infusion Verify at 04/22/23 0535   feeding supplement (ENSURE ENLIVE / ENSURE PLUS) liquid 237 mL  237 mL Oral BID WC Milinda Antis, PA-C   237 mL at 04/23/23 8416   Gerhardt's butt cream 1 Application  1 Application Topical QID Milinda Antis, PA-C   1 Application at 04/26/23 0810   guaiFENesin-dextromethorphan (ROBITUSSIN DM) 100-10 MG/5ML syrup 10 mL  10 mL Oral Q6H PRN Milinda Antis, PA-C   10 mL at 04/03/23 1633   hydrOXYzine (ATARAX) tablet 10 mg  10 mg Oral TID PRN Elijah Birk C, DO   10 mg at 04/24/23  1129   lidocaine (LIDODERM) 5 % 1 patch  1 patch Transdermal Q2200 Milinda Antis, PA-C   1 patch at 04/12/23 2035   lidocaine (LIDODERM) 5 % 1 patch  1 patch Transdermal Q24H Skip Mayer A, MD   1 patch at 04/15/23 2000   LORazepam (ATIVAN) tablet 1 mg  1 mg Oral Q T,Th,Sa-HD Mariel Craft, MD   1 mg at 04/26/23 1138   mometasone-formoterol (DULERA) 200-5 MCG/ACT inhaler 2 puff  2 puff Inhalation BID Milinda Antis, PA-C   2 puff at 04/27/23 6063   Muscle Rub CREA   Topical PRN Fanny Dance, MD       ondansetron [facility]) tablet 4 mg  4 mg Oral Q6H PRN Milinda Antis, PA-C   4 mg at 03/16/23 2154   Or   ondansetron (ZOFRAN) injection 4 mg  4 mg Intravenous Q6H PRN Milinda Antis, PA-C   4 mg at 04/03/23 1603   oxyCODONE (Oxy IR/ROXICODONE) immediate release tablet 2.5 mg  2.5 mg Oral TID PRN Elijah Birk C, DO   2.5 mg at 04/22/23 0015   pantoprazole (PROTONIX) EC tablet 40 mg  40 mg Oral BID Milinda Antis, PA-C   40 mg at 04/27/23 0904   traZODone (DESYREL) tablet 50 mg  50 mg Oral QHS PRN Ranelle Oyster, MD   50 mg at 04/22/23 0015   traZODone (DESYREL) tablet 50 mg  50 mg Oral QHS Ranelle Oyster, MD   50 mg at 04/26/23 2215    Musculoskeletal: Strength & Muscle Tone: sitting in wheel chair Gait & Station: sitting in wheel chair Patient leans: sitting in wheel chair  Psychiatric Specialty Exam:  Presentation  General Appearance:  Disheveled  Eye Contact: Fair  Speech: Slurred  Speech Volume: Normal  Handedness:No data recorded  Mood and Affect  Mood: Anxious  Affect: Restricted   Thought Process  Thought Processes: Disorganized  Descriptions of Associations:Tangential  Orientation:Other (comment) (A&0 x1 (self only))  Thought Content:Tangential; Scattered  History of Schizophrenia/Schizoaffective disorder:No data recorded Duration of Psychotic Symptoms:No data recorded Hallucinations:Hallucinations:  None  Ideas of Reference:None  Suicidal Thoughts:Suicidal Thoughts: No  Homicidal Thoughts:Homicidal Thoughts: No   Sensorium  Memory: Immediate Poor; Recent Poor; Remote Fair  Judgment:No data recorded Insight: Lacking   Executive Functions  Concentration: Poor  Attention Span: Poor  Recall: Other (comment)  Fund of Knowledge: Poor  Language: Fair   Psychomotor Activity  Psychomotor Activity: Psychomotor Activity: Normal   Assets  Assets:No data recorded  Sleep  Sleep: Sleep: Poor   Physical Exam: Physical Exam Constitutional:      General: He is not in acute distress.    Appearance: He is not toxic-appearing.  Neurological:     Mental Status: He is alert.    Review of Systems  Neurological:  Positive for tremors.  Psychiatric/Behavioral:  Positive for memory loss. Negative for depression, hallucinations, substance abuse and suicidal ideas. The patient is nervous/anxious and has insomnia.    Blood pressure (!) 143/63, pulse 60, temperature 98.3 F (36.8 C), resp. rate 16, height 5\' 4"  (1.626 m), weight 88.8 kg, SpO2 96%. Body mass index is 33.6 kg/m.  Treatment Plan Summary:  Primary Psychiatric Diagnoses  Delirium/Dementia 2.   Acute metabolic encephalopathy 3.   Vit C Deficiency      Assessment  [name] [name] is a 72 y.o. male admitted medically to the hospitalists service on 02/15/23 for confusion and SOB in the context of a recent COVID infection and admitted to acute care on 03/12/2023  4:25 PM for acute metabolic encephalopathy assumed due to recent COVID infection.  He has no past psychiatric diagnosis and has a past medical history of COPD, IDT2DM, GERD, and ESRD. Psychiatry was consulted for medication adjustment - lethargy, agitation, delirium Dr. Gasper Sells aware  by Dr. Shearon Stalls.   The patient's presentation remains most consistent with delirium, given relatively acute onset, visual hallucinations, and agitation worse at night.  Notably at this point (chronicity), incident dementia also high on differential - both periods of waxing and waning are significantly below premorbid baseline. Most likely due to multiple etiologies including but not limited to infection (recent COVID-19 infection), medications, pain, altered sleep/wake cycle, and limited mobility. Appreciate PT/OT/ST efforts in increasing pt activity during the day. He is fluctuating around a lower baseline than prior to this hospitalization.    Patient has had no reported adverse affects to Abilify, which was started the last time psychiatry was involved. Generally he has had improvement in alertness without episodes of agitation since this is started, although continues to have agitation intermittently during dialysis.   Psychiatric interventions over past couple of weeks have been threefold - PRNs before dialysis, repletion of vitamin C (which I think is largely responsible for improvement in alertness, and streamlining of overall medication regimen.    Psychiatry will continue to follow, seeing likely once again early next week. Specifically if he tolerates increase dose of abilify, and the discontinuation of depakote without worsening of agitation, we will sign off then.    Minimization of deliriogenic insults will continue to be of utmost importance. This includes promoting the normal circadian cycle, minimizing lines/tubes,  avoiding deliriogenic medications such as benzodiazepines and anticholinergic medications, and frequently reorienting the patient. Symptomatic treatment for agitation can be provided by antipsychotic medications, though it is important to remember that these do not treat the underlying etiology of delirium. Notably, there can be a time lag effect between treatment of a medical problem and resolution of delirium. This time lag effect may be of longer duration in the elderly, and those with underlying cognitive impairment or brain injury.      Plan    ## Psychiatric Medication Recommendations:  -- Increase Abilify from 7.5 mg to 10 mg once daily at bedtime(agitation - effective) -- Hydroxyzine  PRN before dialysis (itching) -- Continue lorazepam 1 mg prior to dialysis on Tuesday, Thursday, and Saturday (note if there is a change in dialysis schedule, please adjust lorazepam dose to only be administered prior to dialysis)  --Recommend a familiar family/friend to be with pt during HD if hospital policy allows --Recommend earlier HD if possible, to avoid any component of sundowning   -- Previously discontinued Depakote ER 250 mg - undetectable and likely not neede.d    -- Continue Vit C - oral OK now that IV course over.    ## Medical Decision Making Capacity:  -- Clearly lacks for most decisions; wife is listed as HCPOA   ## Further Work-up:  -- Most recent EKG on 04/19/23 had QtC of 432 --Depakote level to be drawn on 04/24/2023 -- Pertinent labwork reviewed earlier this admission includes: v low vit C      reasonably extensive work-up largely wnl with the exception of labs associated with ESRD including low eGFR, high BUN, and high albumin, all of which are associated with an increased risk of dementia, delirium, and acute metabolic encephalopathy.   ## Disposition:  --Per previous notes, the patient's wife prefers that patient be discharged to a rehab facility; if not possible she prefers he be discharged home and to transport patient to a dialysis center as needed. No psychiatric contraindications to this plan - defer to PT, OT, and medical assessment. Patient's wife states that a family member can accompany him to dialysis center to help reduce the risk and complications of agitation. Patient has responded well to Abilify and has had no reported significant agitation over the past 48 hours.  -- Recommend a TOC consult   ## Behavioral / Environmental:  --  Delirium Precautions: Delirium Interventions for Nursing and  Staff: - RN to open blinds every AM. - To Bedside: Glasses and pt's own shoes. Make available to patient when possible and encourage use. - Encourage po fluids when appropriate, keep fluids within reach. - OOB to chair with meals. - Passive ROM exercises to all extremities with AM & PM care. - RN to assess orientation to person, time and place QAM and PRN. - Recommend extended visitation hours with familiar family/friends as feasible. - Staff to minimize disturbances at night. Turn off television when pt asleep or when not in use.  - When patient is awake and watching TV, his wife reports that he likes watching western movies, racing, boxing, or [place]   ## Safety and Observation Level:  - Based on my clinical evaluation, I estimate the patient to be at low risk of self harm in the current setting  - At this time, we recommend a routine level of observation. This decision is based on my review of the chart including patient's history and current presentation, interview of the patient, mental status examination, and consideration  of suicide risk including evaluating suicidal ideation, plan, intent, suicidal or self-harm behaviors, risk factors, and protective factors. This judgment is based on our ability to directly address suicide risk, implement suicide prevention strategies and develop a safety plan while the patient is in the clinical setting. Please contact our team if there is a concern that risk level has changed.  Disposition: Patient does not meet criteria for psychiatric inpatient admission.  Cristy Hilts, MD 04/27/2023 10:29 AM  Total Time Spent in Direct Patient Care:  I personally spent 35 minutes on the unit in direct patient care. The direct patient care time included face-to-face time with the patient, reviewing the patient's chart, communicating with other professionals, and coordinating care. Greater than 50% of this time was spent in counseling or coordinating care  with the patient regarding goals of hospitalization, psycho-education, and discharge planning needs.   Phineas Inches, MD Psychiatrist

## 2023-04-27 NOTE — Progress Notes (Signed)
Respiratory consult received re: pt's home cpap. Shearon Stalls, DO messaged and informed that pt should reach out to their home care company if his unit is not functioning properly

## 2023-04-27 NOTE — Progress Notes (Signed)
Occupational Therapy Weekly Progress Note  Patient Details  Name: [name] [name] MRN: [id number] Date of Birth: 1950/12/05  Beginning of progress report period: March 13, 2023 End of progress report period: April 27, 2023  Today's Date: 04/27/2023 OT Individual Time: 4098-1191 OT Individual Time Calculation (min): 27 min    Pt has made some progress this week with his behaviors and participation, Overall, he has been more consistently cooperative and easier to redirect. He has been more consistently min A this week with functional ambulation w/ RW. Continuing to work on a [facility] as outpatient dialysis and his behaviors have been a great barrier.   Patient continues to demonstrate the following deficits: muscle weakness, decreased cardiorespiratoy endurance, decreased initiation, decreased attention, decreased awareness, decreased problem solving, decreased safety awareness, decreased memory, and delayed processing, and decreased standing balance, decreased postural control, and decreased balance strategies and therefore will continue to benefit from skilled OT intervention to enhance overall performance with BADL and Reduce care partner burden.  Patient progressing toward long term goals..  Continue plan of care.  OT Short Term Goals Week 7:  OT Short Term Goal 1 (Week 7): LTG=STG 2/2 ELOS  Skilled Therapeutic Interventions/Progress Updates:    Pt greeted seated at the end of the bed where the bed rail stops eating breakfast. Pt agreeable to get up to the wc. Pt needed increased time to initiate stand, but stood w/ min A and RW, then ambulated 10 feet in room w/ RW and min A. Pt brought to the sink for BADL tasks. OT encouraged pt to wash buttocks and change pants due to odor of urine. Pt became more agitated and refused to wash lower body with OT and only wants his wife. OT able to redirect pt and calm him down. Pt stayed at the sink and initiated and completed UB bathing/dressing and  grooming tasks with increased time and overall supervision from seated position. Pt then brought out to nurse station and left with alarm on, and needs met.   Therapy Documentation Precautions:  Precautions Precautions: Fall Restrictions Weight Bearing Restrictions: No Pain: Denies pain   Therapy/Group: Individual Therapy  [name] 04/27/2023, 8:55 AM

## 2023-04-27 NOTE — Progress Notes (Signed)
Physical Therapy Weekly Progress Note  Patient Details  Name: [name] [name] MRN: [id number] Date of Birth: 24-Dec-1950  Beginning of progress report period: April 20, 2023 End of progress report period: April 27, 2023  Today's Date: 04/27/2023 PT Individual Time: 7829-5621 PT Individual Time Calculation (min): 27 min   Pt continues to progress towards long term goals. Pt has progressed in the past week with pt sessions and participation less impacted by agitation and lethargy. Pt is currently min assist overall however does continue to fluctuate day to day and has been up to max assist. Pt has ambulated with a RW this week up to 242' with min assist and 2nd person WC follow in event of impulsive sitting, has ambulated up to 60' with RW min assist without WC follow. Pt with slightly improved safety awareness noted during mobility and transfers, I.e. remembering to lock brakes in [facility] prior to standing, improved positioning prior to sitting. Pt does seem to demonstrate improved initiation and mobility with use of RW for assist.  Patient continues to demonstrate the following deficits muscle weakness, decreased cardiorespiratoy endurance, impaired timing and sequencing and decreased motor planning,  , decreased initiation, decreased attention, decreased awareness, decreased problem solving, decreased safety awareness, decreased memory, and delayed processing, and decreased sitting balance, decreased standing balance, decreased postural control, and decreased balance strategies and therefore will continue to benefit from skilled PT intervention to increase functional independence with mobility.  Patient progressing toward long term goals..  Continue plan of care.  PT Short Term Goals Week 6:  PT Short Term Goal 1 (Week 6): To continue to consistently meet LTG PT Short Term Goal 1 - Progress (Week 6): Progressing toward goal Week 7:  PT Short Term Goal 1 (Week 7): To continue to consistently meet  LTG  Skilled Therapeutic Interventions/Progress Updates:  Pt presents in [facility] outside of room, pleasantly confused initially but with increasing confusion and some agitation reporting some hallucinations. Therapist attempts to gently reorient throughout session with pt cooperative with session with increased time and encouragement. Session focused on gait training with pt ambulating 60' and 8' with RW CGA/min assist no WC follow demonstrating good RW mgmt and proximity to RW, does require min verbal cues for RW mgmt with fatigue. Pt does attempt to self propel WC 15' with R path deviation, increased fatigue with task. Pt requires frequent rest breaks during which therapist attempting gentle reorientation. Pt remains seated in WC with chair alarm donned and activated at end of session, seated behind nurse's station.  Ambulation/gait training;Community reintegration;DME/adaptive equipment instruction;Psychosocial support;Stair training;Neuromuscular re-education;Wheelchair propulsion/positioning;UE/LE Strength taining/ROM;Balance/vestibular training;Discharge planning;Pain management;Therapeutic Activities;UE/LE Coordination activities;Cognitive remediation/compensation;Functional mobility training;Disease management/prevention;Patient/family education;Therapeutic Exercise;Visual/perceptual remediation/compensation   Therapy Documentation Precautions:  Precautions Precautions: Fall Restrictions Weight Bearing Restrictions: No   Therapy/Group: Individual Therapy  Edwin Cap PT, DPT 04/27/2023, 1:00 PM

## 2023-04-28 LAB — RENAL FUNCTION PANEL
Albumin: 2.5 g/dL — ABNORMAL LOW (ref 3.5–5.0)
Anion gap: 17 — ABNORMAL HIGH (ref 5–15)
BUN: 68 mg/dL — ABNORMAL HIGH (ref 8–23)
CO2: 24 mmol/L (ref 22–32)
Calcium: 9.2 mg/dL (ref 8.9–10.3)
Chloride: 97 mmol/L — ABNORMAL LOW (ref 98–111)
Creatinine, Ser: 11.5 mg/dL — ABNORMAL HIGH (ref 0.61–1.24)
GFR, Estimated: 4 mL/min — ABNORMAL LOW (ref >60)
Glucose, Bld: 137 mg/dL — ABNORMAL HIGH (ref 70–99)
Phosphorus: 4.6 mg/dL (ref 2.5–4.6)
Potassium: 4.3 mmol/L (ref 3.5–5.1)
Sodium: 138 mmol/L (ref 135–145)

## 2023-04-28 LAB — CBC WITH DIFFERENTIAL/PLATELET
Abs Immature Granulocytes: 0.01 10*3/uL (ref 0.00–0.07)
Basophils Absolute: 0.1 10*3/uL (ref 0.0–0.1)
Basophils Relative: 1 %
Eosinophils Absolute: 0.2 10*3/uL (ref 0.0–0.5)
Eosinophils Relative: 4 %
HCT: 33.8 % — ABNORMAL LOW (ref 39.0–52.0)
Hemoglobin: 10.4 g/dL — ABNORMAL LOW (ref 13.0–17.0)
Immature Granulocytes: 0 %
Lymphocytes Relative: 15 %
Lymphs Abs: 0.6 10*3/uL — ABNORMAL LOW (ref 0.7–4.0)
MCH: 27.1 pg (ref 26.0–34.0)
MCHC: 30.8 g/dL (ref 30.0–36.0)
MCV: 88 fL (ref 80.0–100.0)
Monocytes Absolute: 0.5 10*3/uL (ref 0.1–1.0)
Monocytes Relative: 11 %
Neutro Abs: 2.8 10*3/uL (ref 1.7–7.7)
Neutrophils Relative %: 69 %
Platelets: 167 10*3/uL (ref 150–400)
RBC: 3.84 MIL/uL — ABNORMAL LOW (ref 4.22–5.81)
RDW: 15.9 % — ABNORMAL HIGH (ref 11.5–15.5)
WBC: 4.1 10*3/uL (ref 4.0–10.5)
nRBC: 0 % (ref 0.0–0.2)

## 2023-04-28 LAB — GLUCOSE, CAPILLARY: Glucose-Capillary: 132 mg/dL — ABNORMAL HIGH (ref 70–99)

## 2023-04-28 LAB — HEPATITIS B SURFACE ANTIGEN: Hepatitis B Surface Ag: NONREACTIVE

## 2023-04-28 LAB — VITAMIN B1: Vitamin B1 (Thiamine): 88.6 nmol/L (ref 66.5–200.0)

## 2023-04-28 NOTE — Progress Notes (Addendum)
HD Progress Note:   04/28/23 1615  Vitals  Temp 98.4 F (36.9 C)  Temp Source Oral  BP 108/73  MAP (mmHg) 86  Pulse Rate 85  Resp (!) 22  Oxygen Therapy  SpO2 94 %  O2 Device Room Air  Patient Activity (if Appropriate) In bed  Pulse Oximetry Type Continuous  Oximetry Probe Site Changed No  During Treatment Monitoring  HD Safety Checks Performed Yes  Intra-Hemodialysis Comments Tx completed;See progress note  Post Treatment  Dialyzer Clearance Clear  Hemodialysis Intake (mL) 0 mL  Liters Processed 38.4  Fluid Removed (mL) 1000 mL  Tolerated HD Treatment Yes  Post-Hemodialysis Comments Terminated treatment 1 hour early.  AVG/AVF Arterial Site Held (minutes) 10 minutes  AVG/AVF Venous Site Held (minutes) 10 minutes  Fistula / Graft Left Other (Comment) Arteriovenous fistula  Placement Date/Time: 08/09/16 1327   Placed prior to admission: No  Orientation: Left  Access Location: (c) Other (Comment)  Access Type: Arteriovenous fistula  Site Condition No complications  Fistula / Graft Assessment Present;Thrill;Bruit  Status Deaccessed  Drainage Description None    Received patient alert and calm and started HD treatment without any issues. Past around 2 hours on the treatment - started becoming very confused. Seems that the patient is having sundowning. Started by keep on calling out the staff and claiming he wants to go home as wife and daughter is waiting for him. For 10-15 minutes, staff was able to divert his attention. But he progress to be more and more agitated. Staff was then unable to divert his attention. Started trying to come down from his bed. He pulled his tele monitor cables and threatening to pull his AV needles as well. Now keep on bending his access arm as well.  Staff needed to again stay beside him and hold his hands. But even then it was difficult and unable to cope with his behavior. Also due to safety concern - ended HD treatment 1 hour  early. Courtney PA - notified. UF removed (net) = . Suggest: to try to HD him early in the morning.

## 2023-04-28 NOTE — Progress Notes (Signed)
   04/28/23 2016  BiPAP/CPAP/SIPAP  BiPAP/CPAP/SIPAP Pt Type Adult  Reason BIPAP/CPAP not in use Non-compliant

## 2023-04-28 NOTE — Progress Notes (Signed)
Restless at the start of the evening. Needing multiple intervention by staff to verbally redirect him from 1900 to 2200. At times becoming irritable and verbally challenging staff when attempting to redirect him. Asking to use the bathroom and to be brought to the bathroom. When in the bathroom explaining it was not the bathroom and began to walk away. Frustrated staff assisting him with ambulation and encouraging him to sit for his safety. Not being compliant with safety features such as chair alarm staff having to respond multiple times. Press photographer, another nurse, and writer staying in room periodically through the evening for patient's safety and to keep patient calm. Having visual hallucinations this evening as well as looking at object's but identifying them incorrectly. Spacticity with upper and lower extremities. Given PRN pain medication and improvement with spacticity observed. Given PRN medication for anxiety and restlessness. Shortly after given PRN medication in bed resting. Did wake up around 2200 and in bed awake at this time.

## 2023-04-28 NOTE — Progress Notes (Signed)
PROGRESS NOTE   Subjective/Complaints No events overnight. Vitals stable Last BM 10/9 Started on CPAP overnight;   ROS: Limited by cognition.  Denies fevers, chills, N/V, abdominal pain, constipation, diarrhea, SOB, cough, chest pain, new weakness or paraesthesias.  Pertinent positives per HPI above.  Objective:   No results found. Recent Labs    04/26/23 1330  WBC 4.4  HGB 10.5*  HCT 34.4*  PLT 170        Recent Labs    04/26/23 1330  NA 137  K 4.2  CL 96*  CO2 24  GLUCOSE 103*  BUN 49*  CREATININE 10.77*  CALCIUM 9.1         Intake/Output Summary (Last 24 hours) at 04/28/2023 1043 Last data filed at 04/28/2023 0714 Gross per 24 hour  Intake 820 ml  Output --  Net 820 ml           Physical Exam: Vital Signs Blood pressure 129/89, pulse 66, temperature (!) 97.4 F (36.3 C), resp. rate 18, height 5\' 4"  (1.626 m), weight 88.8 kg, SpO2 95%.   Constitutional: No distress . Vital signs reviewed.  Sitting up, in wheelchair at nurses station. HEENT: NCAT, EOMI, oral membranes moist; bilateral puffiness under eyes, stable from prior exams.  Mild tearing of right eye. Cardiovascular: RRR without murmur. No JVD .  No peripheral edema. Respiratory/Chest: CTA Bilaterally without wheezes or rales. Normal effort    GI/Abdomen: BS +, non-tender, non-distended Ext: no clubbing, cyanosis; no appreciable discoloration of bilateral toes. Psych: Pleasant, cooperative, appropriate mood and affect. Skin: No evidence of breakdown, no evidence of rash. RUE  IV intact  Neurologic: AAO to self, [facility], year is 2024; can get October with options -significant improvement from prior exams  + Cognitive deficits -mildly lethargic, poor concentration, poor carryover cranial nerves II through XII intact, Moving all 4 extremities antigravity  and against resistance.  Bilateral upper extremity tremors,  mild today.  Wax and wane.   Musculoskeletal: No obvious deformities.   No joint swelling. No ttp bilateral ankles, calves.    Assessment/Plan: 1. Functional deficits which require 15 hr per week of interdisciplinary therapy in a comprehensive inpatient rehab setting. Physiatrist is providing close team supervision and 24 hour management of active medical problems listed below. Physiatrist and rehab team continue to assess barriers to discharge/monitor patient progress toward functional and medical goals  Care Tool:  Bathing    Body parts bathed by patient: Right arm, Left arm, Chest, Abdomen, Front perineal area, Right upper leg, Buttocks, Left upper leg, Right lower leg, Left lower leg, Face   Body parts bathed by helper: Right arm, Left arm, Chest, Abdomen, Front perineal area, Buttocks, Right upper leg, Left upper leg, Right lower leg, Left lower leg, Face     Bathing assist Assist Level: Total Assistance - Patient < 25%     Upper Body Dressing/Undressing Upper body dressing   What is the patient wearing?: Pull over shirt    Upper body assist Assist Level: Total Assistance - Patient < 25%    Lower Body Dressing/Undressing      Lower body dressing      What is the  patient wearing?: Underwear/pull up, Pants     Lower body assist Assist for lower body dressing: Total Assistance - Patient < 25%     Toileting Toileting    Toileting assist Assist for toileting: Maximal Assistance - Patient 25 - 49%     Transfers Chair/bed transfer  Transfers assist     Chair/bed transfer assist level: Contact Guard/Touching assist     Locomotion Ambulation   Ambulation assist      Assist level: 2 helpers Assistive device: Walker-rolling Max distance: 90'   Walk 10 feet activity   Assist     Assist level: 2 helpers Assistive device: Hand held assist   Walk 50 feet activity   Assist Walk 50 feet with 2 turns activity did not occur: Safety/medical  concerns  Assist level: Minimal Assistance - Patient > 75% Assistive device: No Device    Walk 150 feet activity   Assist Walk 150 feet activity did not occur: Safety/medical concerns  Assist level: Minimal Assistance - Patient > 75% Assistive device: No Device    Walk 10 feet on uneven surface  activity   Assist Walk 10 feet on uneven surfaces activity did not occur: Safety/medical concerns         Wheelchair     Assist Is the patient using a wheelchair?: Yes Type of Wheelchair: Manual    Wheelchair assist level: Dependent - Patient 0%      Wheelchair 50 feet with 2 turns activity    Assist        Assist Level: Dependent - Patient 0%   Wheelchair 150 feet activity     Assist      Assist Level: Dependent - Patient 0%   Blood pressure 129/89, pulse 66, temperature (!) 97.4 F (36.3 C), resp. rate 18, height 5\' 4"  (1.626 m), weight 88.8 kg, SpO2 95%.  Medical Problem List and Plan: 1. Functional deficits secondary to metabolic encephalopathy due to sepsis secondary to catheter peritonitis and hypoxemia              -patient may not shower             -ELOS/Goals: 16-18 days, PT/OT Sup, SLP min A -   - Per wife, patient will need to be SPV/CGA level with rolling walker for return home   - 10/11: Long discussion with wife, by 10/15 we will need to dispo plan home with [facility] and ER for HD vs. to SNF with HD capabilities.  Currently, no options for home or OP dialysis.  Continuing to work on improving behaviors so that he may qualify for outpatient dialysis.  Not considering hospice/palliative at this time.            -Continue CIR therapies including PT, OT, and SLP   -[facility] -- encephalopathy/aggitation has developed since approximately 8/22, with decline earlier following COVID infection per wife   -Therapy QD schedule  - Con't CIR PT, OT and SLP  - Patient was seen by palliative care, changed to DNR status -Marcelino Duster with  palliative continuing to follow  -Tried wife at bedside with dialysis on 10-3, patient remained agitated and pulling at lines, per nephrology not a candidate for outpatient hemodialysis.     10/6 vascular is not recommending conversion to PD.   10-7: Palliative signed off  2.  Antithrombotics: -DVT/anticoagulation:  Pharmaceutical: Heparin, 03/23/23 changed to Lovenox -  consistent refusal -  started on Eliquis 2.5 mg BID - Dced 9/12 per cardiology "With  patient's chronic anemia and agitation/recent falls, appears that risk of OAC>benefits at this time... ". SCDs at bedtime added 10/4.              -antiplatelet therapy: aspirin and Plavix --> ASA alone per cardiology 9/11   3. Pain Management:              -Lidoderm patches to back, Tylenol every 4 hours as needed's, muscle rub as needed, and oxycodone 2.5 mg 3 times daily as needed  -Elavil 25 mg at bedtime for nerve pain and sleep 8/27 -weaned off 9-7 due to oversedation  - No use of PRNs or general complaints of pain since 9-29  4.  Lethargy/ intermittent agitation /insomnia: Persistent despite interventions  -Weaned off of veil bed, remains on telesitter for safety.  Appears somewhat better with wife at bedside and when seated at the station.             -Admitted on Seroquel 50 mg TID -weaned off             -clonazepam 0.5 mg TID prn -transition to lorazepam 0.5 mg daily p.o. or IV as needed for anxiety, agitation -10-4 psychiatry premedicating dialysis with 0.5 mg ativan -Currently have Atarax first-line and Ativan 0.5 mg scheduled predialysis for agitation; redirect and avoid overstimulation as much as possible.   - Avoid increasing sedating medications as much as possible, as there is a direct effect on patient's sleep, confusion, and agitation up to a day after receiving PRN antipsychotics and Ativan.              - Dced Geodon 10 mg IM q 12 hours prn d/t  switched to Abilify per psychiatry 9-17             -8-28 added depakote 125 mg  q8h for ongoing aggitation, mood stabilization -discontinued 9-2, resumed 9-21, increased 9-23 - level 18 subtherapeutic increased to 250 mg Q12h 10/4 -> psych changing to long-acting to 250 mg nightly 10-6; repeat levels 10-8 - <10  - 10-8: D/w psych adjusting vs. Dcing depakote; in agreement with plan, Depakote Dc'd 10/9   - 9-17 Abilify started per psychiatry, increased to 7.5 mg 9-27  - 9-24 memantine 5 mg daily started per psychiatry, discontinued 10-3 per family request due to increasing confusion and lethargy -Psychiatry following, appreciate their assistance and recommendations in managing this patient - Responsive to low stimulating environment, music, and redirection.  Remains intermittently agitated, especially with dialysis and at nighttime, but is significantly improving over the weeks.   10/8: Poor sleep overnight, remains pleasant and cooperative today and appreciate overall slight improvement in behaviors this week. 10/9: Neurology noting pre-existing cognitive deficits documented on the outpatient side; while these are significantly worse, may be beneficial to reach out to other family members regarding his prior cognitive baseline.  Will also look into family bringing in CPAP. 10/12: CPAP started. Psych reccommending possibly increasing abilify next week if tolerated. D/t no PRN use in some time, DC at bedtime scheduled trazadone to limit psychoactive medication burden.    5. Hypoactive delirium/metabolic encephalopathy: This patient is not capable of making decisions on his own behalf.  Persistent despite significant efforts to workup/treat.  -  8/29: EEG negative, 8-30: MRI showing chronic microvascular changes, otherwise normal.  Multiple lab workups for encephalopathy with CMP, ammonia, CBC negative except for low VPA.   -  10-2: Lethargic, jerking , BG 70s, improved with POs.  Resume blood glucose check twice daily.  Repeat VPA levels (18).  CMP CBC look okay.    -10/8: Neuro also  to eval as above; appreciate their assistance and recommendations. -recommending discontinuation of Atarax and Ativan; will consider if he is able to tolerate transition to dialysis chair. Appreciate their assistance and recommendations.  Folate, B12, and TSH normal.   6. Skin/Wound Care: Routine skin care checks             -sacral skin off-loading/protection  10/12: If tolerating dialysis today without PRNs, will DC IV   7. Fluids/Electrolytes/Nutrition: Strict Is and Os and follow-up chemistries per nephrology             - renal diet/ 1200 cc FR  - Renvela 800 mg 3 times daily for hyperphosphatemia added per nephrology 8-27   - Management per nephrology, labs appear approximately stable, p.o. intakes are appropriate at this time but wax and wane with lethargy   - 10/2-10: Mild hyponatremia 134-133 recently; is not worsening, has dipped into this range before, no apparent cognitive effects. Continue management with dialysis. 10/10 - resolved    8: Hypotension/Hypertension: monitor TID and prn             -continue midodrine as needed during dialysis  9-1: add midodrine 5 mg tid and monitor.  DC Coreg as heart rate has been low to normal; may resume at low-dose once blood pressure improved.  9/13 DC midodrine   - Intermittent hypertension into the 150s, is higher on off dialysis days.  Would maintain higher perfusion pressure given microvascular changes seen on MRI.  No medication adjustments for now.  10-7 - 10/9 well-controlled, with intermittent transient elevations.  10/11: Add amlodipine 2.5 mg daily for consistent mild elevations--> normotensive 10/12    04/28/2023    6:20 AM 04/27/2023    7:53 PM 04/27/2023   12:54 PM  Vitals with BMI  Systolic 129 125 161  Diastolic 89 68 77  Pulse 66 78 68     9: Hyperlipidemia: continue statin   10: CAD s/p CABG 2023: on DAPT and statin.  - troponin elevated but stable, monitor 8/27   11: DM-2: A1c = 6.3% (at home on Lantus 22 units at  bedtime, Tradjenta 5 mg daily)        -monitor PO intake             -continue SSI (0-6 units) does not appear to be requiring coverage   - 9/6 DC SSI, change CBGs to daily   - 10-2: Hypoglycemic today, 70s, improved with p.o. intakes, 88 at recheck.  Likely due to poor p.o. intakes last few days.  Start CBG checks twice daily  - 10-4: Patient agitated with CBG checks, refusing.  Moved to as needed for lethargy, AM   12: ESRD: HD on Tues, Thurs, Sat -nephrology made aware  -Regularly have to hold dialysis or stopped early due to agitated behaviors, which is ongoing.  Recently has been able to receive all or part of most dialysis sessions.  -Have tried premedicating patient and providing as needed medications with Haldol, Ativan as available on dialysis unit; measures helped temporarily but overall do not seem to affect treatment  -Wife at bedside for dialysis 10-3, did not improve behaviors, per nephrology patient is not a candidate for outpatient dialysis.     -10/5-6 see discussion above. Wife tries to help in HD. However, behaviors continue to make HD difficult.  -Unfortunately, vascular not offering conversion to PD.  Will discuss possible options  with nephrology.    - 10/8:  Nephro to proved detailed notes on dialysis behaviors this week to target; goal to be OP HD candidate -agitated 10-10 session, continue to work on behaviors  13: COPD/OSA; chronic home 2L O2 (Symbicort, Ventolin, prednisone 40 mg daily at home)             -continue symbicort  -Added home oxygen back 8-27  -Satting well on room air, oxygen not required   -10/11: Patient's wife reports home CPAP machine not usable; RT consult to resume nighttime CPAP - tolerated   14: GERD: continue Protonix   15: Non-sustained VT/pAF/bradycardia: resolved. per cardiology, Dr. Algie Coffer: -continue carvedilol 6.25 mg BID; consider increasing to 12.5 mg BID before starting low dose amiodarone  -consider Eliquis and discontinue asa and  Plavix in one week if no active GI bleeding and stable hemoglobin from 8/24 -hemoglobin has remained stable but very low around 7, no recurrent A-fib, monitor for now given agitation/fall 8-31 and consider initiating Eliquis when safe -Some intermittent recurrent bradycardia while at inpatient rehab, improved with discontinuation of Elavil and Geodon -9/11 EKG for bradycardia episode today, 2nd degree A-V block mobitz type 1.  Continue to hold beta blocker,9/11 will restart eliquis at 2.5 mg BID dose. Will consult cardiology regarding need for ASA/plavix and coreg.  Dr. Elease Hashimoto does not appear convinced that he has A-fib 9/12 per cardiology, DAPT transition to daily aspirin 81 only and Eliquis was discontinued - Heart rate currently well-controlled, hypertension as above, continuing to hold Eliquis due to falls/poor safety awareness, starting SCDs as above   16: HFrEF: Daily weights fluctuating significantly d/t dialysis. Monitor for signs of overload. No current concerns.    17: Liver nodularity on CT scan: LFTs reported to be normal. Avoid hepatotoxic medications    -Abdominal ultrasound earlier this admission with minimal ascites  -Ammonia levels have remained normal on multiple checks   18: Epistaxis - resolved             -previously improved with flonase and saline spray   19: Morbid Obesity. Dietary counseling Body mass index is 33.6 kg/m.    20. Hematuria - resolved  9/22- required in/out to check U/A- having new hematuria- nursing to keep an eye- since also bleeding from penis a little- monitor   9/23 no wBCs  on UA, follow  urine culture-Staphylococcus hemolyticus-discussed with pharmacy and ID pharmacy.  Think this is more likely contaminant  9/25 discuss with nephrology about possible treatment or other evaluation options  Spoke with Dr. Ronalee Belts, most likely contaminant.  Recommended treating only if symptomatic.  If hematuria continues consider urology input  9/27 urine has  improved to clear yellow   21.  Vitamin C deficiency.  Aggressive repletion per psychiatry, 100 mg IV x 6+ 500 mg p.o. daily.  Feel this may help his cognition.  -04-22-09: Am noting some slightly improved attention, memory on gross exam since IV course; continue PO supplementation  23.  Chest pain a.m. 10-3.  EKG unchanged, self resolved.  -control bp as possible  24. Bilateral ankle pain. Changing locations, no known trauma, no instability, edema, or point tenderness. ?Peripheral neuropathy related; may benefit from Qutenza but would not add sedating medications for tx at this time.   -Resolved  LOS: 47 days A FACE TO FACE EVALUATION WAS PERFORMED  Angelina Sheriff 04/28/2023, 10:43 AM

## 2023-04-28 NOTE — Progress Notes (Signed)
[facility] Progress Note   Subjective:    Seen and examined patient on HD. Tolerating UFG 2L. BP is 110/70. Currently, he is calm with no complaints. Watching his behavior very closely.  Objective Vitals:   04/28/23 1335 04/28/23 1400 04/28/23 1430 04/28/23 1500  BP: 125/75 125/76 (!) 123/105 129/76  Pulse: 70 70 81 73  Resp: 20 (!) 22 19 (!) 22  Temp:      TempSrc:      SpO2: 95% 92% 95% 93%  Weight:      Height:       Physical Exam General:WDWN male in NAD, sitting in wheelchair Heart:RRR, no mrg Lungs:+crackles in bases, nml WOB on RA Abdomen:soft, NTND Extremities:trace LE edema Dialysis Access: LU AVF +b/t  Filed Weights   04/24/23 1738 04/26/23 1338 04/28/23 1329  Weight: 88.4 kg 88.8 kg 94.4 kg    Intake/Output Summary (Last 24 hours) at 04/28/2023 1507 Last data filed at 04/28/2023 1252 Gross per 24 hour  Intake 340 ml  Output --  Net 340 ml    Additional Objective Labs: Basic Metabolic Panel: Recent Labs  Lab 04/24/23 1342 04/26/23 1330 04/28/23 1037  NA 133* 137 138  K 4.1 4.2 4.3  CL 94* 96* 97*  CO2 23 24 24   GLUCOSE 129* 103* 137*  BUN 60* 49* 68*  CREATININE 10.93* 10.77* 11.50*  CALCIUM 9.2 9.1 9.2  PHOS 4.9* 4.4 4.6   Liver Function Tests: Recent Labs  Lab 04/24/23 1342 04/26/23 1330 04/28/23 1037  ALBUMIN 2.6* 2.5* 2.5*   No results for input(s): "LIPASE", "AMYLASE" in the last 168 hours. CBC: Recent Labs  Lab 04/24/23 1342 04/26/23 1330 04/28/23 1037  WBC 4.2 4.4 4.1  NEUTROABS  --   --  2.8  HGB 10.7* 10.5* 10.4*  HCT 34.3* 34.4* 33.8*  MCV 89.8 90.8 88.0  PLT 170 170 167   Blood Culture    Component Value Date/Time   SDES URINE, CATHETERIZED 04/08/2023 0915   SPECREQUEST  04/08/2023 0915    NONE Performed at [facility], [address] [place], [place] [address]    CULT >=100,000 COLONIES/mL STAPHYLOCOCCUS HAEMOLYTICUS (A) 04/08/2023 0915   REPTSTATUS 04/10/2023 FINAL 04/08/2023 0915     Cardiac Enzymes: No results for input(s): "CKTOTAL", "CKMB", "CKMBINDEX", "TROPONINI" in the last 168 hours. CBG: No results for input(s): "GLUCAP" in the last 168 hours. Iron Studies: No results for input(s): "IRON", "TIBC", "TRANSFERRIN", "FERRITIN" in the last 72 hours. Lab Results  Component Value Date   INR 1.0 03/08/2023   INR 1.1 03/03/2023   INR 1.2 02/18/2023   Studies/Results: No results found.  Medications:  sodium chloride 10 mL/hr at 04/18/23 1515    (feeding supplement) PROSource Plus  30 mL Oral BID BM   amLODipine  2.5 mg Oral Daily   ARIPiprazole  10 mg Oral QHS   ascorbic acid  500 mg Oral Daily   aspirin EC  81 mg Oral Daily   atorvastatin  80 mg Oral Daily   Chlorhexidine Gluconate Cloth  6 each Topical Q0600   darbepoetin (ARANESP) injection - DIALYSIS  150 mcg Subcutaneous Q Sun-1800   feeding supplement  237 mL Oral BID WC   Gerhardt's butt cream  1 Application Topical QID   lidocaine  1 patch Transdermal Q2200   lidocaine  1 patch Transdermal Q24H   LORazepam  1 mg Oral Q T,Th,Sa-HD   mometasone-formoterol  2 puff Inhalation BID   pantoprazole  40 mg  Oral BID    Dialysis Orders: Previously on [facility]CPD thru [facility] - changed to HD - will need outpatient HD unit established prior to d/c. 4hr, 3K, EDW ~96, L AVF   Assessment/Plan: 1. Debility - in CIR. Appropriateness for CIR nearing its end per NP.   2. Encephalopathy/agitation: Waxing/waning agitation, dementia, sundowning. No acute findings on imaging. Neruo and Psych consulting to help improve behavior. Requesting better documentation on behavior with HD. Reviewed HD RN's note from 10/10: noted patient again became agitated causing treatment to end early.  Did not improve with wife at bedside previously. Patient requires one on one HD for his safety, which is not possible in outpatient setting.  Use of IV Ativan can not be done in OP HD units.   IR will not place catheter, agitation is not  an indication to place catheter.  Considered switching to PD so patient can complete dialysis at home, but vascular surgery felt he was high risk for PD catheter placement.  Meds being adjusted per primary team/psychiatry team.   3. ESRD: Transitioned from PD to HD recently on TTS schedule. Using old AVF which is still functional. Family would prefer TTS HD. Coordinate with rehab team so they know to premedicate before HD, but he is not always willing to take his medications. Using emla cream to decrease pain with cannulation. On HD. 4.Hypertension/volume: BP in goal. Midodrine 5mg  stopped. UF as tolerated.  5. Anemia: last Hgb 10.4-on Aranesp weekly. Last Tsat 38% with ferritin 2175.  No iron.  6. Metabolic bone disease: CorrCa and phos in goal. No VDRA.  7.  Nutrition:  Alb 2.6, continue supps.  Renal diet w/fluid restrictions.  8.  Hx NSVT: On Coreg 10.  Recent culture negative PD cath-related peritonitis and associated fungemia + enterococcus bacteremia - underwent PD cath removal, TDC placement and removal, temp HD cath placement  and removal, and line holiday. Completed IV unasyn, IV micafungin and cipro on 8/15. TDC not replaced cause he had functional AVF 11. Dispo - ongoing issues with safety during HD making patient unable to discharge. Will need consistent safe behavior during HD to be candidate for outpatient.  Will also need to ensure able to tolerate in chair.  If no behavior issues this week will attempt in chair next week.  At that point can initiate CLIP.  He is not a candidate for LTAC per SW. Unfortunately our options are limited to essentially hospice or waiting to see if mental status improves. His wife does not want him to go to hospice. Looking at possible out of state facility if behavior not improved.  [name], NP [facility] 04/28/2023,3:07 PM  LOS: 47 days

## 2023-04-29 MED ORDER — GABAPENTIN 300 MG PO CAPS
300.0000 mg | ORAL_CAPSULE | ORAL | Status: DC
  Administered 2023-05-01 – 2023-05-06 (×2): 300 mg via ORAL
  Filled 2023-04-29 (×3): qty 1

## 2023-04-29 MED ORDER — TRAZODONE HCL 50 MG PO TABS
50.0000 mg | ORAL_TABLET | Freq: Every day | ORAL | Status: DC
  Administered 2023-04-29 – 2023-05-06 (×8): 50 mg via ORAL
  Filled 2023-04-29 (×9): qty 1

## 2023-04-29 MED ORDER — LORAZEPAM 0.5 MG PO TABS
0.5000 mg | ORAL_TABLET | Freq: Every day | ORAL | Status: DC | PRN
  Administered 2023-05-03 – 2023-05-05 (×2): 0.5 mg via ORAL
  Filled 2023-04-29 (×3): qty 1

## 2023-04-29 NOTE — Plan of Care (Signed)
  Problem: Consults Goal: RH GENERAL PATIENT EDUCATION Description: See Patient Education module for education specifics. Outcome: Progressing   Problem: RH BOWEL ELIMINATION Goal: RH STG MANAGE BOWEL WITH ASSISTANCE Description: STG Manage Bowel with mod I Assistance. Outcome: Progressing Goal: RH STG MANAGE BOWEL W/MEDICATION W/ASSISTANCE Description: STG Manage Bowel with Medication with mod I Assistance. Outcome: Progressing   Problem: RH SAFETY Goal: RH STG ADHERE TO SAFETY PRECAUTIONS W/ASSISTANCE/DEVICE Description: STG Adhere to Safety Precautions With cues Assistance/Device. Outcome: Progressing   Problem: RH PAIN MANAGEMENT Goal: RH STG PAIN MANAGED AT OR BELOW PT'S PAIN GOAL Description: < 4 with prns Outcome: Progressing   Problem: RH KNOWLEDGE DEFICIT GENERAL Goal: RH STG INCREASE KNOWLEDGE OF SELF CARE AFTER HOSPITALIZATION Description: Patient and wife will be able to manage care at discharge with educational resources independently Outcome: Progressing   Problem: Education: Goal: Ability to describe self-care measures that may prevent or decrease complications (Diabetes Survival Skills Education) will improve Outcome: Progressing Goal: Individualized Educational Video(s) Outcome: Progressing   Problem: Coping: Goal: Ability to adjust to condition or change in health will improve Outcome: Progressing   Problem: Fluid Volume: Goal: Ability to maintain a balanced intake and output will improve Outcome: Progressing   Problem: Health Behavior/Discharge Planning: Goal: Ability to identify and utilize available resources and services will improve Outcome: Progressing Goal: Ability to manage health-related needs will improve Outcome: Progressing   Problem: Metabolic: Goal: Ability to maintain appropriate glucose levels will improve Outcome: Progressing   Problem: Nutritional: Goal: Maintenance of adequate nutrition will improve Outcome: Progressing Goal:  Progress toward achieving an optimal weight will improve Outcome: Progressing   Problem: Skin Integrity: Goal: Risk for impaired skin integrity will decrease Outcome: Progressing   Problem: Tissue Perfusion: Goal: Adequacy of tissue perfusion will improve Outcome: Progressing   Problem: Education: Goal: Knowledge of General Education information will improve Description: Including pain rating scale, medication(s)/side effects and non-pharmacologic comfort measures Outcome: Progressing   Problem: Health Behavior/Discharge Planning: Goal: Ability to manage health-related needs will improve Outcome: Progressing   Problem: Clinical Measurements: Goal: Ability to maintain clinical measurements within normal limits will improve Outcome: Progressing Goal: Will remain free from infection Outcome: Progressing Goal: Diagnostic test results will improve Outcome: Progressing Goal: Respiratory complications will improve Outcome: Progressing Goal: Cardiovascular complication will be avoided Outcome: Progressing

## 2023-04-29 NOTE — Progress Notes (Signed)
PROGRESS NOTE   Subjective/Complaints Per nursing staff overnight: Restless at the start of the evening. Needing multiple intervention by staff to verbally redirect him from 1900 to 2200. At times becoming irritable and verbally challenging staff when attempting to redirect him. Asking to use the bathroom and to be brought to the bathroom. When in the bathroom explaining it was not the bathroom and began to walk away. Frustrated staff assisting him with ambulation and encouraging him to sit for his safety. Not being compliant with safety features such as chair alarm staff having to respond multiple times. Press photographer, another nurse, and writer staying in room periodically through the evening for patient's safety and to keep patient calm. Having visual hallucinations this evening as well as looking at object's but identifying them incorrectly. Spacticity with upper and lower extremities. Given PRN pain medication and improvement with spacticity observed. Given PRN medication for anxiety and restlessness. Shortly after given PRN medication in bed resting. Did wake up around 2200 and in bed awake at this time.   Also stopped HD an hour early yesterday due to anxiety, trying to crawl out of bed, remove lines.  RR elevated 20s overnight; otherwise vitally stable.   Today, patient clearly more lethargic and with slow cognition.  Frustrated with TeleSitter telling him not to get out of chair or bed.  Otherwise, no complaints.  At nursing station, nurse endorses that "prior to getting Ativan before his dialysis yesterday, he was doing great.  Afterwards, he was really out of it almost immediately."  Agree that this state has persisted into today, with no other known changes.  ROS: Limited by cognition.  Denies fevers, chills, N/V, abdominal pain, constipation, diarrhea, SOB, cough, chest pain, new weakness or paraesthesias.  Pertinent positives per HPI  above.  Objective:   No results found. Recent Labs    04/26/23 1330 04/28/23 1037  WBC 4.4 4.1  HGB 10.5* 10.4*  HCT 34.4* 33.8*  PLT 170 167        Recent Labs    04/26/23 1330 04/28/23 1037  NA 137 138  K 4.2 4.3  CL 96* 97*  CO2 24 24  GLUCOSE 103* 137*  BUN 49* 68*  CREATININE 10.77* 11.50*  CALCIUM 9.1 9.2         Intake/Output Summary (Last 24 hours) at 04/29/2023 1029 Last data filed at 04/29/2023 0837 Gross per 24 hour  Intake 420 ml  Output 1000 ml  Net -580 ml           Physical Exam: Vital Signs Blood pressure (!) 146/79, pulse 82, temperature 97.8 F (36.6 C), resp. rate 18, height 5\' 4"  (1.626 m), weight 94.4 kg, SpO2 96%.   Constitutional: No distress . Vital signs reviewed.  Sitting up, in wheelchair in room.  HEENT: NCAT, EOMI, oral membranes moist; bilateral puffiness under eyes, increased from prior exams, with mild tearing of left eye.   Cardiovascular: RRR without murmur. No JVD .  No peripheral edema. Respiratory/Chest: CTA Bilaterally without wheezes or rales. Normal effort    GI/Abdomen: BS +, non-tender, non-distended Ext: no clubbing, cyanosis; no appreciable discoloration of bilateral toes. Psych: Slightly irritated but cooperative, flat/lethargic.  Skin: No evidence of breakdown, no evidence of rash. RUE  IV intact  Neurologic: AAO to self, rehab but not location, year is 2024; not month or day.  Worse than prior exams.  + Cognitive deficits -mildly lethargic, poor concentration, poor carryover -worse today cranial nerves II through XII intact, Moving all 4 extremities antigravity  and against resistance, 4/5 throughout, limited by bilateral upper extremity tremors. Musculoskeletal: No obvious deformities.   No joint swelling.   Assessment/Plan: 1. Functional deficits which require 15 hr per week of interdisciplinary therapy in a comprehensive inpatient rehab setting. Physiatrist is providing close team supervision  and 24 hour management of active medical problems listed below. Physiatrist and rehab team continue to assess barriers to discharge/monitor patient progress toward functional and medical goals  Care Tool:  Bathing    Body parts bathed by patient: Right arm, Left arm, Chest, Abdomen, Front perineal area, Right upper leg, Buttocks, Left upper leg, Right lower leg, Left lower leg, Face   Body parts bathed by helper: Right arm, Left arm, Chest, Abdomen, Front perineal area, Buttocks, Right upper leg, Left upper leg, Right lower leg, Left lower leg, Face     Bathing assist Assist Level: Total Assistance - Patient < 25%     Upper Body Dressing/Undressing Upper body dressing   What is the patient wearing?: Pull over shirt    Upper body assist Assist Level: Total Assistance - Patient < 25%    Lower Body Dressing/Undressing      Lower body dressing      What is the patient wearing?: Underwear/pull up, Pants     Lower body assist Assist for lower body dressing: Total Assistance - Patient < 25%     Toileting Toileting    Toileting assist Assist for toileting: Maximal Assistance - Patient 25 - 49%     Transfers Chair/bed transfer  Transfers assist     Chair/bed transfer assist level: Contact Guard/Touching assist     Locomotion Ambulation   Ambulation assist      Assist level: 2 helpers Assistive device: Walker-rolling Max distance: 90'   Walk 10 feet activity   Assist     Assist level: 2 helpers Assistive device: Hand held assist   Walk 50 feet activity   Assist Walk 50 feet with 2 turns activity did not occur: Safety/medical concerns  Assist level: Minimal Assistance - Patient > 75% Assistive device: No Device    Walk 150 feet activity   Assist Walk 150 feet activity did not occur: Safety/medical concerns  Assist level: Minimal Assistance - Patient > 75% Assistive device: No Device    Walk 10 feet on uneven surface  activity   Assist  Walk 10 feet on uneven surfaces activity did not occur: Safety/medical concerns         Wheelchair     Assist Is the patient using a wheelchair?: Yes Type of Wheelchair: Manual    Wheelchair assist level: Dependent - Patient 0%      Wheelchair 50 feet with 2 turns activity    Assist        Assist Level: Dependent - Patient 0%   Wheelchair 150 feet activity     Assist      Assist Level: Dependent - Patient 0%   Blood pressure (!) 146/79, pulse 82, temperature 97.8 F (36.6 C), resp. rate 18, height 5\' 4"  (1.626 m), weight 94.4 kg, SpO2 96%.  Medical Problem List and Plan: 1. Functional deficits secondary to metabolic encephalopathy due  to sepsis secondary to catheter peritonitis and hypoxemia              -patient may not shower             -ELOS/Goals: 16-18 days, PT/OT Sup, SLP min A -   - Per wife, patient will need to be SPV/CGA level with rolling walker for return home   - 10/11: Long discussion with wife, by 10/15 we will need to dispo plan home with [facility] and ER for HD vs. to SNF with HD capabilities.  Currently, no options for home or OP dialysis.  Continuing to work on improving behaviors so that he may qualify for outpatient dialysis.  Not considering hospice/palliative at this time.            -Continue CIR therapies including PT, OT, and SLP   -[facility] -- encephalopathy/aggitation has developed since approximately 8/22, with decline earlier following COVID infection per wife   -Therapy QD schedule  - Con't CIR PT, OT and SLP  - Patient was seen by palliative care, changed to DNR status -Marcelino Duster with palliative continuing to follow  -Tried wife at bedside with dialysis on 10-3, patient remained agitated and pulling at lines, per nephrology not a candidate for outpatient hemodialysis.     10/6 vascular is not recommending conversion to PD.   10-7: Palliative signed off  2.  Antithrombotics: -DVT/anticoagulation:   Pharmaceutical: Heparin, 03/23/23 changed to Lovenox -  consistent refusal -  started on Eliquis 2.5 mg BID - Dced 9/12 per cardiology "With patient's chronic anemia and agitation/recent falls, appears that risk of OAC>benefits at this time... ". SCDs at bedtime added 10/4.              -antiplatelet therapy: aspirin and Plavix --> ASA alone per cardiology 9/11   3. Pain Management:              -Lidoderm patches to back, Tylenol every 4 hours as needed's, muscle rub as needed, and oxycodone 2.5 mg 3 times daily as needed  -Elavil 25 mg at bedtime for nerve pain and sleep 8/27 -weaned off 9-7 due to oversedation  - No use of PRNs or general complaints of pain since 9-29  4.  Lethargy/ intermittent agitation /insomnia: Persistent despite interventions  -Weaned off of veil bed, remains on telesitter for safety.  Appears somewhat better with wife at bedside and when seated at the station.             -Admitted on Seroquel 50 mg TID -weaned off             -clonazepam 0.5 mg TID prn -transition to lorazepam 0.5 mg daily p.o. or IV as needed for anxiety, agitation -10-4 psychiatry premedicating dialysis with1 mg ativan -Currently have Atarax first-line and Ativan 1 mg scheduled predialysis for agitation; redirect and avoid overstimulation as much as possible.   - Avoid increasing sedating medications as much as possible, as there is a direct effect on patient's sleep, confusion, and agitation up to a day after receiving PRN antipsychotics and Ativan.              - Dced Geodon 10 mg IM q 12 hours prn d/t  switched to Abilify per psychiatry 9-17             -8-28 added depakote 125 mg q8h for ongoing aggitation, mood stabilization -discontinued 9-2, resumed 9-21, increased 9-23 - level 18 subtherapeutic increased to 250 mg Q12h  10/4 -> psych changing to long-acting to 250 mg nightly 10-6; repeat levels 10-8 - <10  - 10-8: D/w psych adjusting vs. Dcing depakote; in agreement with plan, Depakote Dc'd 10/9    - 9-17 Abilify started per psychiatry, increased to 7.5 mg 9-27  - 9-24 memantine 5 mg daily started per psychiatry, discontinued 10-3 per family request due to increasing confusion and lethargy -Psychiatry following, appreciate their assistance and recommendations in managing this patient - Responsive to low stimulating environment, music, and redirection.  Remains intermittently agitated, especially with dialysis and at nighttime, but is significantly improving over the weeks.   10/8: Poor sleep overnight, remains pleasant and cooperative today and appreciate overall slight improvement in behaviors this week. 10/9: Neurology noting pre-existing cognitive deficits documented on the outpatient side; while these are significantly worse, may be beneficial to reach out to other family members regarding his prior cognitive baseline.  Will also look into family bringing in CPAP. 10/12: CPAP ordered.  Psych reccommending possibly increasing abilify next week if tolerated. D/t no PRN use in some time, DC at bedtime scheduled trazadone to limit psychoactive medication burden.   10/13: Poor sleep, resume trazadone 50 mg at bedtime.  As seen with prior administrations, believe predialysis Ativan is causing significant lethargy and confusion up to a day after administration  5. Hypoactive delirium/metabolic encephalopathy: This patient is not capable of making decisions on his own behalf.  Persistent despite significant efforts to workup/treat.  -  8/29: EEG negative, 8-30: MRI showing chronic microvascular changes, otherwise normal.  Multiple lab workups for encephalopathy with CMP, ammonia, CBC negative except for low VPA.   -  10-2: Lethargic, jerking , BG 70s, improved with POs.  Resume blood glucose check twice daily. Repeat VPA levels (18).  CMP CBC look okay.    -10/8: Neuro also to eval as above; appreciate their assistance and recommendations. -recommending discontinuation of Atarax and Ativan; will  consider if he is able to tolerate transition to dialysis chair. Appreciate their assistance and recommendations.  Folate, B12, and TSH normal.   - 10/13: Will add 300 mg gabapentin to pre-dialysis medications to reduce discomfort  during treatments. Since it appears ativan wears off halfway through session, will change to PRN 0.5 mg during dialysis only to target behaviors.  Will discuss with nephrology team before Tuesday dialysis administering as soon as patient shows any signs of  irritability.    6. Skin/Wound Care: Routine skin care checks             -sacral skin off-loading/protection   7. Fluids/Electrolytes/Nutrition: Strict Is and Os and follow-up chemistries per nephrology             - renal diet/ 1200 cc FR  - Renvela 800 mg 3 times daily for hyperphosphatemia added per nephrology 8-27   - Management per nephrology, labs appear approximately stable, p.o. intakes are appropriate at this time but wax and wane with lethargy   - 10/2-10: Mild hyponatremia 134-133 recently; is not worsening, has dipped into this range before, no apparent cognitive effects. Continue management with dialysis. 10/10 - resolved    8: Hypotension/Hypertension: monitor TID and prn             -continue midodrine as needed during dialysis  9-1: add midodrine 5 mg tid and monitor.  DC Coreg as heart rate has been low to normal; may resume at low-dose once blood pressure improved.  9/13 DC midodrine   - Intermittent hypertension into the 150s,  is higher on off dialysis days.  Would maintain higher perfusion pressure given microvascular changes seen on MRI.  No medication adjustments for now.  10-7 - 10/9 well-controlled, with intermittent transient elevations.  10/11: Add amlodipine 2.5 mg daily for consistent mild elevations--> normotensive 10/12-13    04/28/2023    7:18 PM 04/28/2023    4:52 PM 04/28/2023    4:15 PM  Vitals with BMI  Systolic 146 149 578  Diastolic 79 77 73  Pulse 82 73 85     9:  Hyperlipidemia: continue statin   10: CAD s/p CABG 2023: on DAPT and statin.  - troponin elevated but stable, monitor 8/27   11: DM-2: A1c = 6.3% (at home on Lantus 22 units at bedtime, Tradjenta 5 mg daily)        -monitor PO intake             -continue SSI (0-6 units) does not appear to be requiring coverage   - 9/6 DC SSI, change CBGs to daily   - 10-2: Hypoglycemic today, 70s, improved with p.o. intakes, 88 at recheck.  Likely due to poor p.o. intakes last few days.  Start CBG checks twice daily  - 10-4: Patient agitated with CBG checks, refusing.  Moved to as needed for lethargy, AM   12: ESRD: HD on Tues, Thurs, Sat -nephrology made aware  -Regularly have to hold dialysis or stopped early due to agitated behaviors, which is ongoing.  Recently has been able to receive all or part of most dialysis sessions.  -Have tried premedicating patient and providing as needed medications with Haldol, Ativan as available on dialysis unit; measures helped temporarily but overall do not seem to affect treatment  -Wife at bedside for dialysis 10-3, did not improve behaviors, per nephrology patient is not a candidate for outpatient dialysis.     -10/5-6 see discussion above. Wife tries to help in HD. However, behaviors continue to make HD difficult.  -Unfortunately, vascular not offering conversion to PD.  Will discuss possible options with nephrology.    - 10/8:  Nephro to proved detailed notes on dialysis behaviors this week to target; goal to be OP HD candidate -agitated 10-10 and 10/12 session, continue to work on behaviors as above  13: COPD/OSA; chronic home 2L O2 (Symbicort, Ventolin, prednisone 40 mg daily at home)             -continue symbicort  -Added home oxygen back 8-27  -Satting well on room air, oxygen not required   -10/11: Patient's wife reports home CPAP machine not usable; RT consult to resume nighttime CPAP   - not using   14: GERD: continue Protonix   15: Non-sustained  VT/pAF/bradycardia: resolved. per cardiology, Dr. Algie Coffer: -continue carvedilol 6.25 mg BID; consider increasing to 12.5 mg BID before starting low dose amiodarone  -consider Eliquis and discontinue asa and Plavix in one week if no active GI bleeding and stable hemoglobin from 8/24 -hemoglobin has remained stable but very low around 7, no recurrent A-fib, monitor for now given agitation/fall 8-31 and consider initiating Eliquis when safe -Some intermittent recurrent bradycardia while at inpatient rehab, improved with discontinuation of Elavil and Geodon -9/11 EKG for bradycardia episode today, 2nd degree A-V block mobitz type 1.  Continue to hold beta blocker,9/11 will restart eliquis at 2.5 mg BID dose. Will consult cardiology regarding need for ASA/plavix and coreg.  Dr. Elease Hashimoto does not appear convinced that he has A-fib 9/12 per cardiology, DAPT transition to daily  aspirin 81 only and Eliquis was discontinued - Heart rate currently well-controlled, hypertension as above, continuing to hold Eliquis due to falls/poor safety awareness, starting SCDs as above   16: HFrEF: Daily weights fluctuating significantly d/t dialysis. Monitor for signs of overload. No current concerns.    17: Liver nodularity on CT scan: LFTs reported to be normal. Avoid hepatotoxic medications    -Abdominal ultrasound earlier this admission with minimal ascites  -Ammonia levels have remained normal on multiple checks   18: Epistaxis - resolved             -previously improved with flonase and saline spray   19: Morbid Obesity. Dietary counseling Body mass index is 35.72 kg/m.    20. Hematuria - resolved  9/22- required in/out to check U/A- having new hematuria- nursing to keep an eye- since also bleeding from penis a little- monitor   9/23 no wBCs  on UA, follow  urine culture-Staphylococcus hemolyticus-discussed with pharmacy and ID pharmacy.  Think this is more likely contaminant  9/25 discuss with nephrology about  possible treatment or other evaluation options  Spoke with Dr. Ronalee Belts, most likely contaminant.  Recommended treating only if symptomatic.  If hematuria continues consider urology input  9/27 urine has improved to clear yellow   21.  Vitamin C deficiency.  Aggressive repletion per psychiatry, 100 mg IV x 6+ 500 mg p.o. daily.  Feel this may help his cognition.  -04-22-09: Am noting some slightly improved attention, memory on gross exam since IV course; continue PO supplementation  23.  Chest pain a.m. 10-3.  EKG unchanged, self resolved.  -control bp as possible  24. Bilateral ankle pain. Changing locations, no known trauma, no instability, edema, or point tenderness. ?Peripheral neuropathy related; may benefit from Qutenza but would not add sedating medications for tx at this time.   -Resolved  LOS: 48 days A FACE TO FACE EVALUATION WAS PERFORMED  Angelina Sheriff 04/29/2023, 10:29 AM

## 2023-04-29 NOTE — Progress Notes (Addendum)
[facility] Progress Note   Subjective:    Seen and examined at bedside. Informed patient again became agitated near end of treatment yelling to come off the machine. Managed to remove 1L yesterday. Noted he was agitated again overnight (see RN note from 10/12). Seen sitting up in wheelchair. Wife at bedside. He denies SOB and CP. He became tearful when talking with him today.  Objective Vitals:   04/28/23 1652 04/28/23 1918 04/28/23 2014 04/29/23 1340  BP: (!) 149/77 (!) 146/79  116/72  Pulse: 73 82  67  Resp: 18 18  20   Temp: 97.8 F (36.6 C)   98.6 F (37 C)  TempSrc:      SpO2: 99% 95% 96% 96%  Weight:      Height:       Physical Exam General:WDWN male in NAD, sitting in wheelchair Heart:RRR, no mrg Lungs:+crackles in bases, nml WOB on RA Abdomen:soft, NTND Extremities:trace LE edema Dialysis Access: LU AVF +b/t    Filed Weights   04/24/23 1738 04/26/23 1338 04/28/23 1329  Weight: 88.4 kg 88.8 kg 94.4 kg    Intake/Output Summary (Last 24 hours) at 04/29/2023 1651 Last data filed at 04/29/2023 1341 Gross per 24 hour  Intake 660 ml  Output --  Net 660 ml    Additional Objective Labs: Basic Metabolic Panel: Recent Labs  Lab 04/24/23 1342 04/26/23 1330 04/28/23 1037  NA 133* 137 138  K 4.1 4.2 4.3  CL 94* 96* 97*  CO2 23 24 24   GLUCOSE 129* 103* 137*  BUN 60* 49* 68*  CREATININE 10.93* 10.77* 11.50*  CALCIUM 9.2 9.1 9.2  PHOS 4.9* 4.4 4.6   Liver Function Tests: Recent Labs  Lab 04/24/23 1342 04/26/23 1330 04/28/23 1037  ALBUMIN 2.6* 2.5* 2.5*   No results for input(s): "LIPASE", "AMYLASE" in the last 168 hours. CBC: Recent Labs  Lab 04/24/23 1342 04/26/23 1330 04/28/23 1037  WBC 4.2 4.4 4.1  NEUTROABS  --   --  2.8  HGB 10.7* 10.5* 10.4*  HCT 34.3* 34.4* 33.8*  MCV 89.8 90.8 88.0  PLT 170 170 167   Blood Culture    Component Value Date/Time   SDES URINE, CATHETERIZED 04/08/2023 0915   SPECREQUEST  04/08/2023 0915     NONE Performed at [facility], [address], [place], [place] [address]    CULT >=100,000 COLONIES/mL STAPHYLOCOCCUS HAEMOLYTICUS (A) 04/08/2023 0915   REPTSTATUS 04/10/2023 FINAL 04/08/2023 0915    Cardiac Enzymes: No results for input(s): "CKTOTAL", "CKMB", "CKMBINDEX", "TROPONINI" in the last 168 hours. CBG: Recent Labs  Lab 04/28/23 1958  GLUCAP 132*   Iron Studies: No results for input(s): "IRON", "TIBC", "TRANSFERRIN", "FERRITIN" in the last 72 hours. Lab Results  Component Value Date   INR 1.0 03/08/2023   INR 1.1 03/03/2023   INR 1.2 02/18/2023   Studies/Results: No results found.  Medications:  sodium chloride 10 mL/hr at 04/18/23 1515    (feeding supplement) PROSource Plus  30 mL Oral BID BM   amLODipine  2.5 mg Oral Daily   ARIPiprazole  10 mg Oral QHS   ascorbic acid  500 mg Oral Daily   aspirin EC  81 mg Oral Daily   atorvastatin  80 mg Oral Daily   Chlorhexidine Gluconate Cloth  6 each Topical Q0600   darbepoetin (ARANESP) injection - DIALYSIS  150 mcg Subcutaneous Q Sun-1800   feeding supplement  237 mL Oral BID WC   [START ON 05/01/2023] gabapentin  300  mg Oral Q T,Th,Sa-HD   Gerhardt's butt cream  1 Application Topical QID   lidocaine  1 patch Transdermal Q2200   lidocaine  1 patch Transdermal Q24H   LORazepam  1 mg Oral Q T,Th,Sa-HD   mometasone-formoterol  2 puff Inhalation BID   pantoprazole  40 mg Oral BID   traZODone  50 mg Oral QHS    Dialysis Orders: Previously on [facility] thru [facility] - changed to HD - will need outpatient HD unit established prior to d/c. 4hr, 3K, EDW ~96, L AVF   Assessment/Plan: 1. Debility - in CIR. Appropriateness for CIR nearing its end per NP.   2. Encephalopathy/agitation: Waxing/waning agitation, dementia, sundowning. No acute findings on imaging. Neruo and Psych consulting to help improve behavior. Requesting better documentation on behavior with HD. Patient with ongoing agitated and  hallucinations. He didn't do well with HD on 10/9 and 10/12. Did not improve with wife at bedside previously. Patient requires one on one HD for his safety, which is not possible in outpatient setting.  Use of IV Ativan can not be done in OP HD units.   IR will not place catheter, agitation is not an indication to place catheter.  Considered switching to PD so patient can complete dialysis at home, but vascular surgery felt he was high risk for PD catheter placement.  Meds being adjusted per primary team/psychiatry team.   3. ESRD: Transitioned from PD to HD recently on TTS schedule. Using old AVF which is still functional. Family would prefer TTS HD. Coordinate with rehab team so they know to premedicate before HD, but he is not always willing to take his medications. Using emla cream to decrease pain with cannulation. On HD. 4.Hypertension/volume: BP in goal. Midodrine 5mg  stopped. UF as tolerated.  5. Anemia: last Hgb 10.4-on Aranesp weekly. Last Tsat 38% with ferritin 2175.  No iron.  6. Metabolic bone disease: CorrCa and phos in goal. No VDRA.  7.  Nutrition:  Alb 2.6, continue supps.  Renal diet w/fluid restrictions.  8.  Hx NSVT: On Coreg 10.  Recent culture negative PD cath-related peritonitis and associated fungemia + enterococcus bacteremia - underwent PD cath removal, TDC placement and removal, temp HD cath placement  and removal, and line holiday. Completed IV unasyn, IV micafungin and cipro on 8/15. TDC not replaced cause he had functional AVF 11. Dispo - ongoing issues with safety during HD making patient unable to discharge. Will need consistent safe behavior during HD to be candidate for outpatient.  Will also need to ensure able to tolerate in chair.  If no behavior issues this week will attempt in chair next week.  At that point can initiate CLIP.  He is not a candidate for LTAC per SW. Unfortunately our options are limited to essentially hospice or waiting to see if mental status  improves. His wife does not want him to go to hospice. Looking at possible out of state facility if behavior not improved.   [name], NP [facility] 04/29/2023,4:51 PM  LOS: 48 days

## 2023-04-29 NOTE — Plan of Care (Signed)
  Problem: Fluid Volume: Goal: Ability to maintain a balanced intake and output will improve Outcome: Progressing   Problem: Skin Integrity: Goal: Risk for impaired skin integrity will decrease Outcome: Progressing   Problem: Tissue Perfusion: Goal: Adequacy of tissue perfusion will improve Outcome: Progressing   Problem: Clinical Measurements: Goal: Will remain free from infection Outcome: Progressing   Problem: Elimination: Goal: Will not experience complications related to urinary retention Outcome: Progressing   Problem: Safety: Goal: Ability to remain free from injury will improve Outcome: Progressing   Problem: Respiratory: Goal: Respiratory status will improve Outcome: Progressing

## 2023-04-30 MED ORDER — DIVALPROEX SODIUM 250 MG PO DR TAB
250.0000 mg | DELAYED_RELEASE_TABLET | Freq: Every day | ORAL | Status: DC
  Administered 2023-04-30 – 2023-05-06 (×7): 250 mg via ORAL
  Filled 2023-04-30 (×8): qty 1

## 2023-04-30 NOTE — Consult Note (Signed)
[name] Consult Evaluation  Service Date: April 30, 2023 LOS:  LOS: 49 days    Primary Psychiatric Diagnoses  Delirium/Dementia 2.   Acute metabolic encephalopathy 3.   Vit C Deficiency    Assessment  [name] [name] is a 72 y.o. male admitted medically to the hospitalists service on 02/15/23 for confusion and SOB in the context of a recent COVID infection and admitted to acute care on 03/12/2023  4:25 PM for acute metabolic encephalopathy assumed due to recent COVID infection.  He has no past psychiatric diagnosis and has a past medical history of COPD, IDT2DM, GERD, and ESRD. Psychiatry was consulted for medication adjustment - lethargy, agitation, delirium Dr. Gasper Sells aware  by Dr. Shearon Stalls.  The patient's presentation remains most consistent with hypoactive delirium, given relatively acute onset, visual hallucinations, and agitation worse at night. Notably at this point (chronicity), incident dementia also high on differential - both periods of waxing and waning are significantly below premorbid baseline. Most likely due to multiple etiologies including but not limited to infection (recent COVID-19 infection), medications, pain, altered sleep/wake cycle, and limited mobility. Appreciate PT/OT/ST efforts in increasing pt activity during the day. He is fluctuating around a lower baseline than prior to this hospitalization.   Patient has had no reported adverse affects to Abilify, which was started the last time psychiatry was involved. Generally he has had improvement in alertness without episodes of agitation since this is started, although continues to have agitation intermittently during dialysis. This time in discussion with Dr. Shearon Stalls and review of records, he was on the precipice of hospice if he was not able to remain calm during dialysis. Psychiatric interventions over past couple of weeks have been threefold - PRNs before dialysis, repletion of vitamin C (which I think  is largely responsible for improvement in alertness, and streamlining of overall medication regimen (stopped depakte d/t undetectable levels). Since around 10/8, he has had more overnight events (one prompted by overnight evaluation) and also been having outsized reactions to pre-dialysis ativan. On 10/14 evaluation similar to several prev evals (pleasant but confused); wife at bedside assented to depakote.    Minimization of deliriogenic insults will continue to be of utmost importance. This includes promoting the normal circadian cycle, minimizing lines/tubes, avoiding deliriogenic medications such as benzodiazepines and anticholinergic medications, and frequently reorienting the patient. Symptomatic treatment for agitation can be provided by antipsychotic medications, though it is important to remember that these do not treat the underlying etiology of delirium. Notably, there can be a time lag effect between treatment of a medical problem and resolution of delirium. This time lag effect may be of longer duration in the elderly, and those with underlying cognitive impairment or brain injury.   Diagnoses:  Active Hospital problems: Principal Problem:   Acute metabolic encephalopathy Active Problems:   Type 2 diabetes mellitus with chronic kidney disease on chronic dialysis, with long-term current use of insulin (HCC)   HFrEF (heart failure with reduced ejection fraction) (HCC)   Morbid obesity (HCC)   Encephalopathy   Epistaxis   PAF (paroxysmal atrial fibrillation) (HCC)   Long term (current) use of insulin (HCC)   ESRD on dialysis (HCC)   Debility   Agitation   Hematuria     Plan   ## Psychiatric Medication Recommendations:  -- Abilify inc to 10 mg on F 10/11 , PO, daily at bedtime(agitation - effective) -- restart depakote 250 mg at bedtime 10/14  -- Hydroxyzine  PRN before dialysis (  itching) -- Start lorazepam 1 mg prior to dialysis on Tuesday, Thursday, and Saturday (note if  there is a change in dialysis schedule, please adjust lorazepam dose to only be administered prior to dialysis) -- discontinue as needed lorazepam, as this may be disinhibiting.  -- Continue Vit C - oral OK now that IV course over.   ## Medical Decision Making Capacity:  -- Clearly lacks for most decisions; wife is listed as HCPOA  ## Further Work-up:  -- Most recent EKG on 04/19/23 had QtC of 432 --Depakote level to be drawn on 04/24/2023 -- Pertinent labwork reviewed earlier this admission includes: v low vit C    reasonably extensive work-up largely wnl with the exception of labs associated with ESRD including low eGFR, high BUN, and high albumin, all of which are associated with an increased risk of dementia, delirium, and acute metabolic encephalopathy.  ## Disposition:  -- Patient's wife prefers that patient be discharged to a rehab facility; if not possible she prefers he be discharged home and to transport patient to a dialysis center as needed. No psychiatric contraindications to this plan - defer to PT, OT, and medical assessment. Patient's wife states that a family member can accompany him to dialysis center to help reduce the risk and complications of agitation. Patient has responded well to Abilify and has had no reported significant agitation over the past 48 hours. -- Recommend a TOC consult  ## Behavioral / Environmental:  --  Delirium Precautions: Delirium Interventions for Nursing and Staff: - RN to open blinds every AM. - To Bedside: Glasses and pt's own shoes. Make available to patient when possible and encourage use. - Encourage po fluids when appropriate, keep fluids within reach. - OOB to chair with meals. - Passive ROM exercises to all extremities with AM & PM care. - RN to assess orientation to person, time and place QAM and PRN. - Recommend extended visitation hours with familiar family/friends as feasible. - Staff to minimize disturbances at night. Turn off  television when pt asleep or when not in use.  - When patient is awake and watching TV, his wife reports that he likes watching western movies, racing, boxing, or [place]  ## Safety and Observation Level:  - Based on my clinical evaluation, I estimate the patient to be at low risk of self harm in the current setting - At this time, we recommend a routine level of observation. This decision is based on my review of the chart including patient's history and current presentation, interview of the patient, mental status examination, and consideration of suicide risk including evaluating suicidal ideation, plan, intent, suicidal or self-harm behaviors, risk factors, and protective factors. This judgment is based on our ability to directly address suicide risk, implement suicide prevention strategies and develop a safety plan while the patient is in the clinical setting. Please contact our team if there is a concern that risk level has changed.  Suicide risk assessment: Patient has following modifiable risk factors for suicide: access to guns, which we are addressing by the patient's wife planning to lock up the guns prior to the patient returning home.   Patient has following non-modifiable or demographic risk factors for suicide: male gender and psychiatric hospitalization  Patient has the following protective factors against suicide: Supportive family, Supportive friends, and no history of suicide attempts  Thank you for this consult request. Recommendations have been communicated to the primary team.  We will continue to follow at this time.  Makyla Bye A Jakaila Norment  Psychiatric and Social History   Relevant Aspects of Hospital Course:  Admitted on 02/15/23 for confusion and SOB in the context of a recent COVID infection. He was found to have sepsis secondary to peritoneal dialysis associated catheter peritonitis and polymicrobial bacteremia/fungemia. Broad spectrum antibiotics started. Hospital  course complicated by worsening encephalopathy with hypoxemia requiring transfer to the ICU.   Patient Report:  Pt was awake, alert, and oriented at least superficially to situation. Attenttion remains quite poor but continues to attempt tasks. Looks a little worse than when I last saw 10/8 - no attempts at humor by pt, personality faded. Unable to give any particulars re: what he is working on in PT. Remains unaware of outside events. Has no memory of overnight agitation last several nights - state dhe had been sleeping well. Continues to vocalize desire to have good dialysis sessions os he can leave.  Wife at bedside provided consent to restarting depakote.   On PE today 10/14 has improved minimal cogwheeling in b/l u/e.   Psych ROS:  Today denied SI, HI, AH/VH both historic and current. Not able to do detailed sx inventory d/t dementia.   Depression: In hospital, patient displays signs of sleep disturbance, fatigue, diminished concentration and cognition, and psychomotor changes. 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  Anxiety: Patient did not express or display concerns for anxiety, but was somnolent throughout interview Mania (lifetime and current): No reported symptoms of mania  Psychosis: (lifetime and current): Patient's wife reports that over the course of his hospital stay, at times the patient had hallucinations about deceased or living family members who are not currently in the room with him. None reported in the past 48 hours.  Collateral information:  10/8: Wife has seen a significant improvement in the past week - much more alert, able to listen, having better conversations. She is agreeable to dc depakote. Went over his current psychotropic medications. Informed her we will likely sign off if he does well after dc of Depakote.     Psychiatric History:  Information collected from patient's wife and EMR  Prev Psych Dx/Sx: None Current Psych Provider: None Home Meds (current): None Previous Psych Med Trials: No past history Therapy: No past history  Prior ECT: Wife denies Prior Psych Hospitalization: Wife denies Prior Self Harm: Wife denies Prior Violence: Wife denies  Family Psych History: Wife states that there is no known psych history in the patient's family Family Hx suicide: Wife denies  Social History:  Developmental Hx: None on record Educational Hx: Deferred Occupational Hx: Patient worked as a Naval architect for 32 years Legal Hx: Wife states patient currently has no legal issues Living Situation: Lives with wife at home Spiritual Hx: Patient normally attends church every Sunday and participates in Sunday school Access to weapons: Wife states that there are currently guns in the home, but she plans to have them locked up prior to the patient returning home  Substance History Tobacco use: Wife states patient does not use tobacco Alcohol use: Wife states patient does not use alcohol Drug use: Wife states patient does not use drugs   Exam Findings   Psychiatric Specialty Exam: Patient is oriented to self and place; superficially  to situation. Attention and concentration are poor, likely new baseline; a little worse than this time last week. Has  more difficulty describing his emotional state. No psychomotor agitation/retardation. Insight is fair; judgment is fair to poor. He denies SI, HI, or AVH. He is cooperative throughout the interview and displays no signs of agitation.  Physical Exam: Vital signs:  Temp:  [97.6 F (36.4 C)-98.6 F (37 C)] 98.3 F (36.8 C) (10/14 0931) Pulse Rate:  [67-78] 69 (10/14 0931) Resp:  [18-20] 18 (10/14 0931) BP: (115-153)/(71-74) 115/71 (10/14 0931) SpO2:  [84 %-98 %] 98 % (10/14 0931)  Physical Exam Constitutional:      Appearance: Normal appearance.  He is obese.  HENT:     Head: Normocephalic and atraumatic.  Eyes:     Conjunctiva/sclera: Conjunctivae normal.  Cardiovascular:     Rate and Rhythm: Normal rate.  Pulmonary:     Effort: Pulmonary effort is normal. No respiratory distress.  Neurological:     General: No focal deficit present.    Blood pressure 115/71, pulse 69, temperature 98.3 F (36.8 C), resp. rate 18, height 5\' 4"  (1.626 m), weight 94.4 kg, SpO2 98%. Body mass index is 35.72 kg/m.   Other History   These have been pulled in through the EMR, reviewed, and updated if appropriate.   Family History:  The patient's family history includes Arthritis in an other family member; Asthma in an other family member; Cancer in his mother; Diabetes in his father, mother, and sister; Hypertension in his father, mother, and sister; Lung disease in an other family member.  Medical History: Past Medical History:  Diagnosis Date   Anemia    Arthritis    Asthma    Bell palsy    CAD (coronary artery disease)    a. 2014 MV: abnl w/ infap ischemia; b. 03/2013 Cath: aneurysmal bleb in the LAD w/ otw nonobs dzs-->Med Rx.   Chronic back pain    Chronic knee pain    a. 09/2015 s/p R TKA.   Chronic pain    Chronic shoulder pain    Chronic sinusitis    COPD (chronic obstructive pulmonary disease) (HCC)    Diabetes mellitus without complication (HCC)    type II    ESRD on peritoneal dialysis (HCC)    on peritoneal dialysis, [facility]    Essential hypertension    GERD (gastroesophageal reflux disease)    Gout    Gout    Hepatomegaly    noted on noncontrast CT 2015   History of hiatal hernia    Hyperlipidemia    Lateral meniscus tear    Obesity    Truncal   Obstructive sleep apnea    does not use cpap    On home oxygen therapy    uses 2l when is going somewhere per patient    PUD (peptic ulcer disease)    remote, reports f/u EGD about 8 years ago unremarkable    Reactive airway disease    related to exposure to  chemical during 9/11   Sinusitis    Vitamin D deficiency     Surgical History: Past Surgical History:  Procedure Laterality Date   A/V FISTULAGRAM N/A 02/28/2023   Procedure: A/V Fistulagram;  Surgeon: Victorino Sparrow, MD;  Location: [facility] INVASIVE CV LAB;  Service: Cardiovascular;  Laterality: N/A;   ASAD LT SHOULDER  12/15/2008   left shoulder   AV FISTULA PLACEMENT Left 08/09/2016   Procedure: BRACHIOCEPHALIC ARTERIOVENOUS (AV) FISTULA CREATION LEFT ARM;  Surgeon: Sherren Kerns, MD;  Location: [facility] OR;  Service: Vascular;  Laterality: Left;   CAPD INSERTION N/A  10/07/2018   Procedure: LAPAROSCOPIC PERITONEAL CATHETER PLACEMENT;  Surgeon: Sheliah Hatch De Blanch, MD;  Location: [facility] ORS;  Service: General;  Laterality: N/A;   CATARACT EXTRACTION W/PHACO Left 03/28/2016   Procedure: CATARACT EXTRACTION PHACO AND INTRAOCULAR LENS PLACEMENT LEFT EYE;  Surgeon: Jethro Bolus, MD;  Location: [facility] ORS;  Service: Ophthalmology;  Laterality: Left;  CDE: 4.77   CATARACT EXTRACTION W/PHACO Right 04/11/2016   Procedure: CATARACT EXTRACTION PHACO AND INTRAOCULAR LENS PLACEMENT RIGHT EYE; CDE:  4.74;  Surgeon: Jethro Bolus, MD;  Location: [facility] ORS;  Service: Ophthalmology;  Laterality: Right;   COLONOSCOPY  10/15/2008   Fields: Rectal polyp obliterated, not retrieved, hemorrhoids, single ascending colon diverticulum near the CV. Next colonoscopy April 2020   COLONOSCOPY N/A 12/25/2014   SLF: 1. Colorectal polyps (2) removed 2. Small internal hemorrhoids 3. the left colon is severely redundant. hyperplastic polyps   CORONARY ARTERY BYPASS GRAFT N/A 06/05/2022   Procedure: OFF PUMP CORONARY ARTERY BYPASS GRAFTING (CABG) X 2 BYPASSES USING LEFT INTERNAL MAMMARY ARTERY AND RIGHT LEG GREATER SAPHENOUS VEIN HARVESTED ENDOSCOPICALLY;  Surgeon: Corliss Skains, MD;  Location: [facility] OR;  Service: Open Heart Surgery;  Laterality: N/A;   CORONARY STENT INTERVENTION N/A 07/25/2021   Procedure: CORONARY STENT INTERVENTION;   Surgeon: Tonny Bollman, MD;  Location: [facility] INVASIVE CV LAB;  Service: Cardiovascular;  Laterality: N/A;   CORONARY STENT INTERVENTION N/A 12/26/2021   Procedure: CORONARY STENT INTERVENTION;  Surgeon: Yvonne Kendall, MD;  Location: [facility] INVASIVE CV LAB;  Service: Cardiovascular;  Laterality: N/A;   CORONARY STENT INTERVENTION N/A 01/20/2022   Procedure: CORONARY STENT INTERVENTION;  Surgeon: Tonny Bollman, MD;  Location: [facility] INVASIVE CV LAB;  Service: Cardiovascular;  Laterality: N/A;   DOPPLER ECHOCARDIOGRAPHY     ESOPHAGOGASTRODUODENOSCOPY N/A 12/25/2014   SLF: 1. Anemia most likely due to CRI, gastritis, gastric polyps 2. Moderate non-erosive gastriits and mild duodenitis.  3.TWo large gstric polyps removed.    EYE SURGERY  12/22/2010   tear duct probing-[facility]   FOREIGN BODY REMOVAL  03/29/2011   Procedure: REMOVAL FOREIGN BODY EXTREMITY;  Surgeon: Fuller Canada, MD;  Location: [facility]S;  Service: Orthopedics;  Laterality: Right;  Removal Foreign Body Right Thumb   IR FLUORO GUIDE CV LINE RIGHT  08/06/2018   IR FLUORO GUIDE CV LINE RIGHT  02/17/2023   IR US GUIDE VASC ACCESS RIGHT  08/06/2018   IR US GUIDE VASC ACCESS RIGHT  02/17/2023   KNEE ARTHROSCOPY  10/16/2007   left   KNEE ARTHROSCOPY WITH LATERAL MENISECTOMY Right 10/14/2015   Procedure: LEFT KNEE ARTHROSCOPY WITH PARTIAL LATERAL MENISECTOMY;  Surgeon: Vickki Hearing, MD;  Location: [facility];  Service: Orthopedics;  Laterality: Right;   LEFT HEART CATH AND CORONARY ANGIOGRAPHY N/A 07/25/2021   Procedure: LEFT HEART CATH AND CORONARY ANGIOGRAPHY;  Surgeon: Tonny Bollman, MD;  Location: [facility] INVASIVE CV LAB;  Service: Cardiovascular;  Laterality: N/A;   LEFT HEART CATH AND CORONARY ANGIOGRAPHY N/A 12/26/2021   Procedure: LEFT HEART CATH AND CORONARY ANGIOGRAPHY;  Surgeon: Yvonne Kendall, MD;  Location: [facility] INVASIVE CV LAB;  Service: Cardiovascular;  Laterality: N/A;   LEFT HEART CATH AND CORONARY ANGIOGRAPHY N/A 01/20/2022   Procedure:  LEFT HEART CATH AND CORONARY ANGIOGRAPHY;  Surgeon: Tonny Bollman, MD;  Location: [facility] INVASIVE CV LAB;  Service: Cardiovascular;  Laterality: N/A;   LEFT HEART CATHETERIZATION WITH CORONARY ANGIOGRAM N/A 03/28/2013   Procedure: LEFT HEART CATHETERIZATION WITH CORONARY ANGIOGRAM;  Surgeon: Marykay Lex, MD;  Location: [facility] CATH LAB;  Service: Cardiovascular;  Laterality: N/A;   NM MYOVIEW LTD     PENILE PROSTHESIS IMPLANT N/A 08/16/2015   Procedure: PENILE PROTHESIS INFLATABLE, three piece, Excisional biopsy of Penile ulcer, Penile molding;  Surgeon: Jethro Bolus, MD;  Location: [facility] ORS;  Service: Urology;  Laterality: N/A;   PENILE PROSTHESIS IMPLANT N/A 12/24/2017   Procedure: REMOVAL AND  REPLACEMENT  COLOPLAST PENILE PROSTHESIS;  Surgeon: Crista Elliot, MD;  Location: [facility] ORS;  Service: Urology;  Laterality: N/A;   PERIPHERAL VASCULAR BALLOON ANGIOPLASTY  02/28/2023   Procedure: PERIPHERAL VASCULAR BALLOON ANGIOPLASTY;  Surgeon: Victorino Sparrow, MD;  Location: [facility] INVASIVE CV LAB;  Service: Cardiovascular;;   PORT-A-CATH REMOVAL N/A 02/19/2023   Procedure: REMOVAL OF PERITONEAL DIALYSIS CATHETER;  Surgeon: Victorino Sparrow, MD;  Location: [facility] OR;  Service: Vascular;  Laterality: N/A;   QUADRICEPS TENDON REPAIR  07/21/2011   Procedure: REPAIR QUADRICEP TENDON;  Surgeon: Fuller Canada, MD;  Location: [facility]S;  Service: Orthopedics;  Laterality: Right;   RIGHT/LEFT HEART CATH AND CORONARY ANGIOGRAPHY N/A 05/30/2022   Procedure: RIGHT/LEFT HEART CATH AND CORONARY ANGIOGRAPHY;  Surgeon: Corky Crafts, MD;  Location: [facility] INVASIVE CV LAB;  Service: Cardiovascular;  Laterality: N/A;   TEE WITHOUT CARDIOVERSION N/A 06/05/2022   Procedure: TRANSESOPHAGEAL ECHOCARDIOGRAM (TEE);  Surgeon: Corliss Skains, MD;  Location: [facility] OR;  Service: Open Heart Surgery;  Laterality: N/A;   TOENAIL EXCISION     removed x2-bilateral   UMBILICAL HERNIA REPAIR  07/17/2005   [place]    Medications:    Current Facility-Administered Medications:    (feeding supplement) PROSource Plus liquid 30 mL, 30 mL, Oral, BID BM, Stovall, Kathryn R, PA-C, 30 mL at 04/25/23 0953   0.9 %  sodium chloride infusion, , Intravenous, PRN, Angelina Sheriff, DO, Last Rate: 10 mL/hr at 04/18/23 1515, New Bag at 04/18/23 1515   acetaminophen (TYLENOL) tablet 325-650 mg, 325-650 mg, Oral, Q4H PRN, Milinda Antis, PA-C, 650 mg at 04/12/23 2036   amLODipine (NORVASC) tablet 2.5 mg, 2.5 mg, Oral, Daily, Elijah Birk C, DO, 2.5 mg at 04/30/23 4782   ARIPiprazole (ABILIFY) tablet 10 mg, 10 mg, Oral, QHS, Massengill, Harrold Donath, MD, 10 mg at 04/29/23 2051   ascorbic acid (VITAMIN C) tablet 500 mg, 500 mg, Oral, Daily, Massengill, Nathan, MD, 500 mg at 04/30/23 9562   aspirin EC tablet 81 mg, 81 mg, Oral, Daily, Milinda Antis, PA-C, 81 mg at 04/30/23 0933   atorvastatin (LIPITOR) tablet 80 mg, 80 mg, Oral, Daily, Milinda Antis, PA-C, 80 mg at 04/30/23 0933   bisacodyl (DULCOLAX) EC tablet 5 mg, 5 mg, Oral, Daily PRN, Valetta Fuller, Lynnell Jude, PA-C   Chlorhexidine Gluconate Cloth 2 % PADS 6 each, 6 each, Topical, Q0600, Penninger, Lillia Abed, PA   Darbepoetin Alfa (ARANESP) injection 150 mcg, 150 mcg, Subcutaneous, Q Sun-1800, Paytes, Austin A, RPH, 150 mcg at 04/29/23 1746   feeding supplement (ENSURE ENLIVE / ENSURE PLUS) liquid 237 mL, 237 mL, Oral, BID WC, Setzer, Sandra J, PA-C, 237 mL at 04/23/23 0755   [START ON 05/01/2023] gabapentin (NEURONTIN) capsule 300 mg, 300 mg, Oral, Q T,Th,Sa-HD, Engler, Morgan C, DO   Gerhardt's butt cream 1 Application, 1 Application, Topical, QID, Setzer, Lynnell Jude, PA-C, 1 Application at 04/30/23 0933   guaiFENesin-dextromethorphan (ROBITUSSIN DM) 100-10 MG/5ML syrup 10 mL, 10 mL, Oral, Q6H PRN, Milinda Antis, PA-C, 10 mL at 04/03/23 1633   hydrOXYzine (ATARAX) tablet 10 mg, 10 mg, Oral, TID PRN, Shearon Stalls,  Morgan C, DO, 10 mg at 04/28/23 2029   lidocaine (LIDODERM) 5 % 1 patch, 1 patch,  Transdermal, Q2200, Milinda Antis, PA-C, 1 patch at 04/12/23 2035   lidocaine (LIDODERM) 5 % 1 patch, 1 patch, Transdermal, Q24H, Skip Mayer A, MD, 1 patch at 04/15/23 2000   LORazepam (ATIVAN) tablet 0.5 mg, 0.5 mg, Oral, Daily PRN, Angelina Sheriff, DO   mometasone-formoterol (DULERA) 200-5 MCG/ACT inhaler 2 puff, 2 puff, Inhalation, BID, Setzer, Lynnell Jude, PA-C, 2 puff at 04/30/23 1610   Muscle Rub CREA, , Topical, PRN, Fanny Dance, MD   ondansetron [facility]) tablet 4 mg, 4 mg, Oral, Q6H PRN, 4 mg at 03/16/23 2154 **OR** ondansetron (ZOFRAN) injection 4 mg, 4 mg, Intravenous, Q6H PRN, Setzer, Lynnell Jude, PA-C, 4 mg at 04/03/23 1603   oxyCODONE (Oxy IR/ROXICODONE) immediate release tablet 2.5 mg, 2.5 mg, Oral, TID PRN, Elijah Birk C, DO, 2.5 mg at 04/28/23 2000   pantoprazole (PROTONIX) EC tablet 40 mg, 40 mg, Oral, BID, Setzer, Lynnell Jude, PA-C, 40 mg at 04/30/23 0933   traZODone (DESYREL) tablet 50 mg, 50 mg, Oral, QHS, Angelina Sheriff, DO, 50 mg at 04/29/23 2051  Allergies: Allergies  Allergen Reactions   Opana [Oxymorphone Hcl] Itching   Tramadol Itching

## 2023-04-30 NOTE — Progress Notes (Addendum)
PROGRESS NOTE   Subjective/Complaints  Per nursing staff overnight 10/13 Pt was up most of the night with confusion and hallucination. Pt not following commands and safety measures. Pt climbed out of his bed multiple times and kept going back and forth from bed to chair with intermittent confusion. This nurse had to stay in pt room most of the time to provide safety. Pt has been up till this morning resting from 2100 to midnight. Pt alert and verbal and very difficult to redirect.   Patient ambulating in the hallways with physical therapy this morning with a rolling walker.  Patient was able to comment when he felt unsafe during ambulation.  Patient reports he feels well overall this morning.  No new concerns elicited  ROS: Limited by cognition.  Denies fevers, chills, N/V, abdominal pain, constipation, diarrhea, SOB, cough, chest pain, joint pain, new weakness or paraesthesias.  Pertinent positives per HPI above.  Objective:   No results found. Recent Labs    04/28/23 1037  WBC 4.1  HGB 10.4*  HCT 33.8*  PLT 167        Recent Labs    04/28/23 1037  NA 138  K 4.3  CL 97*  CO2 24  GLUCOSE 137*  BUN 68*  CREATININE 11.50*  CALCIUM 9.2         Intake/Output Summary (Last 24 hours) at 04/30/2023 1507 Last data filed at 04/30/2023 1300 Gross per 24 hour  Intake 120 ml  Output --  Net 120 ml           Physical Exam: Vital Signs Blood pressure 112/68, pulse 73, temperature (!) 97.4 F (36.3 C), resp. rate 17, height 5\' 4"  (1.626 m), weight 94.4 kg, SpO2 96%.   Constitutional: No distress . Vital signs reviewed.  Ambulating with physical therapy, sat in a chair in the hallway. HEENT: NCAT, EOMI, oral membranes moist Cardiovascular: RRR without murmur. No JVD .  No peripheral edema. Respiratory/Chest: CTA Bilaterally without wheezes or rales. Normal effort    GI/Abdomen: BS +, non-tender,  non-distended Ext: no clubbing, cyanosis; no appreciable discoloration of bilateral toes. Psych: Cooperative, fairly pleasant this morning Skin: No evidence of breakdown, no evidence of rash.  Left upper extremity AV fistula  Neurologic: Alert and awake  + Cognitive deficits cranial nerves II through XII grossly intact, improved safety awareness from prior days noted Moving all 4 extremities antigravity  and against resistance, 4/5 throughout, limited by bilateral upper extremity tremors. Musculoskeletal: No obvious deformities.   No joint swelling.   Assessment/Plan: 1. Functional deficits which require 15 hr per week of interdisciplinary therapy in a comprehensive inpatient rehab setting. Physiatrist is providing close team supervision and 24 hour management of active medical problems listed below. Physiatrist and rehab team continue to assess barriers to discharge/monitor patient progress toward functional and medical goals  Care Tool:  Bathing    Body parts bathed by patient: Right arm, Left arm, Chest, Abdomen, Front perineal area, Right upper leg, Buttocks, Left upper leg, Right lower leg, Left lower leg, Face   Body parts bathed by helper: Right arm, Left arm, Chest, Abdomen, Front perineal area, Buttocks, Right upper leg, Left upper  leg, Right lower leg, Left lower leg, Face     Bathing assist Assist Level: Total Assistance - Patient < 25%     Upper Body Dressing/Undressing Upper body dressing   What is the patient wearing?: Pull over shirt    Upper body assist Assist Level: Total Assistance - Patient < 25%    Lower Body Dressing/Undressing      Lower body dressing      What is the patient wearing?: Underwear/pull up, Pants     Lower body assist Assist for lower body dressing: Total Assistance - Patient < 25%     Toileting Toileting    Toileting assist Assist for toileting: Maximal Assistance - Patient 25 - 49%     Transfers Chair/bed  transfer  Transfers assist     Chair/bed transfer assist level: Contact Guard/Touching assist     Locomotion Ambulation   Ambulation assist      Assist level: 2 helpers Assistive device: Walker-rolling Max distance: 90'   Walk 10 feet activity   Assist     Assist level: 2 helpers Assistive device: Hand held assist   Walk 50 feet activity   Assist Walk 50 feet with 2 turns activity did not occur: Safety/medical concerns  Assist level: Minimal Assistance - Patient > 75% Assistive device: No Device    Walk 150 feet activity   Assist Walk 150 feet activity did not occur: Safety/medical concerns  Assist level: Minimal Assistance - Patient > 75% Assistive device: No Device    Walk 10 feet on uneven surface  activity   Assist Walk 10 feet on uneven surfaces activity did not occur: Safety/medical concerns         Wheelchair     Assist Is the patient using a wheelchair?: Yes Type of Wheelchair: Manual    Wheelchair assist level: Dependent - Patient 0%      Wheelchair 50 feet with 2 turns activity    Assist        Assist Level: Dependent - Patient 0%   Wheelchair 150 feet activity     Assist      Assist Level: Dependent - Patient 0%   Blood pressure 112/68, pulse 73, temperature (!) 97.4 F (36.3 C), resp. rate 17, height 5\' 4"  (1.626 m), weight 94.4 kg, SpO2 96%.  Medical Problem List and Plan: 1. Functional deficits secondary to metabolic encephalopathy due to sepsis secondary to catheter peritonitis and hypoxemia              -patient may not shower             -ELOS/Goals: 16-18 days, PT/OT Sup, SLP min A -   - Per wife, patient will need to be SPV/CGA level with rolling walker for return home   - 10/11: Long discussion with wife, by 10/15 we will need to dispo plan home with [facility] and ER for HD vs. to SNF with HD capabilities.  Currently, no options for home or OP dialysis.  Continuing to work on improving behaviors so that  he may qualify for outpatient dialysis.  Not considering hospice/palliative at this time.  -Team conference tomorrow            -Continue CIR therapies including PT, OT, and SLP   -[facility] -- encephalopathy/aggitation has developed since approximately 8/22, with decline earlier following COVID infection per wife   -Therapy QD schedule  - Con't CIR PT, OT and SLP  - Patient was seen by palliative care,  changed to DNR status -Marcelino Duster with palliative continuing to follow  -Tried wife at bedside with dialysis on 10-3, patient remained agitated and pulling at lines, per nephrology not a candidate for outpatient hemodialysis.     10/6 vascular is not recommending conversion to PD.   10-7: Palliative signed off  2.  Antithrombotics: -DVT/anticoagulation:  Pharmaceutical: Heparin, 03/23/23 changed to Lovenox -  consistent refusal -  started on Eliquis 2.5 mg BID - Dced 9/12 per cardiology "With patient's chronic anemia and agitation/recent falls, appears that risk of OAC>benefits at this time... ". SCDs at bedtime added 10/4.              -antiplatelet therapy: aspirin and Plavix --> ASA alone per cardiology 9/11   3. Pain Management:              -Lidoderm patches to back, Tylenol every 4 hours as needed's, muscle rub as needed, and oxycodone 2.5 mg 3 times daily as needed  -Elavil 25 mg at bedtime for nerve pain and sleep 8/27 -weaned off 9-7 due to oversedation  - No use of PRNs or general complaints of pain since 9-29  4.  Lethargy/ intermittent agitation /insomnia: Persistent despite interventions  -Weaned off of veil bed, remains on telesitter for safety.  Appears somewhat better with wife at bedside and when seated at the station.             -Admitted on Seroquel 50 mg TID -weaned off             -clonazepam 0.5 mg TID prn -transition to lorazepam 0.5 mg daily p.o. or IV as needed for anxiety, agitation -10-4 psychiatry premedicating dialysis with1 mg ativan -Currently  have Atarax first-line and Ativan 1 mg scheduled predialysis for agitation; redirect and avoid overstimulation as much as possible.   - Avoid increasing sedating medications as much as possible, as there is a direct effect on patient's sleep, confusion, and agitation up to a day after receiving PRN antipsychotics and Ativan.              - Dced Geodon 10 mg IM q 12 hours prn d/t  switched to Abilify per psychiatry 9-17             -8-28 added depakote 125 mg q8h for ongoing aggitation, mood stabilization -discontinued 9-2, resumed 9-21, increased 9-23 - level 18 subtherapeutic increased to 250 mg Q12h 10/4 -> psych changing to long-acting to 250 mg nightly 10-6; repeat levels 10-8 - <10  - 10-8: D/w psych adjusting vs. Dcing depakote; in agreement with plan, Depakote Dc'd 10/9   - 9-17 Abilify started per psychiatry, increased to 7.5 mg 9-27  - 9-24 memantine 5 mg daily started per psychiatry, discontinued 10-3 per family request due to increasing confusion and lethargy -Psychiatry following, appreciate their assistance and recommendations in managing this patient - Responsive to low stimulating environment, music, and redirection.  Remains intermittently agitated, especially with dialysis and at nighttime, but is significantly improving over the weeks.   10/8: Poor sleep overnight, remains pleasant and cooperative today and appreciate overall slight improvement in behaviors this week. 10/9: Neurology noting pre-existing cognitive deficits documented on the outpatient side; while these are significantly worse, may be beneficial to reach out to other family members regarding his prior cognitive baseline.  Will also look into family bringing in CPAP. 10/12: CPAP ordered.  Psych reccommending possibly increasing abilify next week if tolerated. D/t no PRN use in some time, DC at bedtime  scheduled trazadone to limit psychoactive medication burden.   10/13: Poor sleep, resume trazadone 50 mg at bedtime.  As  seen with prior administrations, believe predialysis Ativan is causing significant lethargy and confusion up to a day after administration  5. Hypoactive delirium/metabolic encephalopathy: This patient is not capable of making decisions on his own behalf.  Persistent despite significant efforts to workup/treat.  -  8/29: EEG negative, 8-30: MRI showing chronic microvascular changes, otherwise normal.  Multiple lab workups for encephalopathy with CMP, ammonia, CBC negative except for low VPA.   -  10-2: Lethargic, jerking , BG 70s, improved with POs.  Resume blood glucose check twice daily. Repeat VPA levels (18).  CMP CBC look okay.    -10/8: Neuro also to eval as above; appreciate their assistance and recommendations. -recommending discontinuation of Atarax and Ativan; will consider if he is able to tolerate transition to dialysis chair. Appreciate their assistance and recommendations.  Folate, B12, and TSH normal.   - 10/13: Will add 300 mg gabapentin to pre-dialysis medications to reduce discomfort  during treatments. Since it appears ativan wears off halfway through session, will change to PRN 0.5 mg during dialysis only to target behaviors.  Will discuss with nephrology team before Tuesday dialysis administering as soon as patient shows any signs of  irritability.  -10/14 Depakote restart by psychiatry today, monitor response   6. Skin/Wound Care: Routine skin care checks             -sacral skin off-loading/protection   7. Fluids/Electrolytes/Nutrition: Strict Is and Os and follow-up chemistries per nephrology             - renal diet/ 1200 cc FR  - Renvela 800 mg 3 times daily for hyperphosphatemia added per nephrology 8-27   - Management per nephrology, labs appear approximately stable, p.o. intakes are appropriate at this time but wax and wane with lethargy   - 10/2-10: Mild hyponatremia 134-133 recently; is not worsening, has dipped into this range before, no apparent cognitive effects.  Continue management with dialysis. 10/10 - resolved    8: Hypotension/Hypertension: monitor TID and prn             -continue midodrine as needed during dialysis  9-1: add midodrine 5 mg tid and monitor.  DC Coreg as heart rate has been low to normal; may resume at low-dose once blood pressure improved.  9/13 DC midodrine   - Intermittent hypertension into the 150s, is higher on off dialysis days.  Would maintain higher perfusion pressure given microvascular changes seen on MRI.  No medication adjustments for now.  10-7 - 10/9 well-controlled, with intermittent transient elevations.  10/11: Add amlodipine 2.5 mg daily for consistent mild elevations--> normotensive 10/12-13  -10/14 BP controlled overall, continue current regimen    04/30/2023    1:16 PM 04/30/2023    9:31 AM 04/30/2023    2:51 AM  Vitals with BMI  Systolic 112 115 161  Diastolic 68 71 74  Pulse 73 69 73     9: Hyperlipidemia: continue statin   10: CAD s/p CABG 2023: on DAPT and statin.  - troponin elevated but stable, monitor 8/27   11: DM-2: A1c = 6.3% (at home on Lantus 22 units at bedtime, Tradjenta 5 mg daily)        -monitor PO intake             -continue SSI (0-6 units) does not appear to be requiring coverage   - 9/6 DC  SSI, change CBGs to daily   - 10-2: Hypoglycemic today, 70s, improved with p.o. intakes, 88 at recheck.  Likely due to poor p.o. intakes last few days.  Start CBG checks twice daily  - 10-4: Patient agitated with CBG checks, refusing.  Moved to as needed for lethargy, AM  -10/14 glucose stable on most recent BMP   12: ESRD: HD on Tues, Thurs, Sat -nephrology made aware  -Regularly have to hold dialysis or stopped early due to agitated behaviors, which is ongoing.  Recently has been able to receive all or part of most dialysis sessions.  -Have tried premedicating patient and providing as needed medications with Haldol, Ativan as available on dialysis unit; measures helped temporarily but  overall do not seem to affect treatment  -Wife at bedside for dialysis 10-3, did not improve behaviors, per nephrology patient is not a candidate for outpatient dialysis.     -10/5-6 see discussion above. Wife tries to help in HD. However, behaviors continue to make HD difficult.  -Unfortunately, vascular not offering conversion to PD.  Will discuss possible options with nephrology.    - 10/8:  Nephro to proved detailed notes on dialysis behaviors this week to target; goal to be OP HD candidate -agitated 10-10 and 10/12 session, continue to work on behaviors as above  13: COPD/OSA; chronic home 2L O2 (Symbicort, Ventolin, prednisone 40 mg daily at home)             -continue symbicort  -Added home oxygen back 8-27  -Satting well on room air, oxygen not required   -10/11: Patient's wife reports home CPAP machine not usable; RT consult to resume nighttime CPAP   - not using   14: GERD: continue Protonix   15: Non-sustained VT/pAF/bradycardia: resolved. per cardiology, Dr. Algie Coffer: -continue carvedilol 6.25 mg BID; consider increasing to 12.5 mg BID before starting low dose amiodarone  -consider Eliquis and discontinue asa and Plavix in one week if no active GI bleeding and stable hemoglobin from 8/24 -hemoglobin has remained stable but very low around 7, no recurrent A-fib, monitor for now given agitation/fall 8-31 and consider initiating Eliquis when safe -Some intermittent recurrent bradycardia while at inpatient rehab, improved with discontinuation of Elavil and Geodon -9/11 EKG for bradycardia episode today, 2nd degree A-V block mobitz type 1.  Continue to hold beta blocker,9/11 will restart eliquis at 2.5 mg BID dose. Will consult cardiology regarding need for ASA/plavix and coreg.  Dr. Elease Hashimoto does not appear convinced that he has A-fib 9/12 per cardiology, DAPT transition to daily aspirin 81 only and Eliquis was discontinued - Heart rate currently well-controlled, hypertension as above,  continuing to hold Eliquis due to falls/poor safety awareness, starting SCDs as above   16: HFrEF: Daily weights fluctuating significantly d/t dialysis. Monitor for signs of overload. No current concerns.    17: Liver nodularity on CT scan: LFTs reported to be normal. Avoid hepatotoxic medications    -Abdominal ultrasound earlier this admission with minimal ascites  -Ammonia levels have remained normal on multiple checks   18: Epistaxis - resolved             -previously improved with flonase and saline spray   19: Morbid Obesity. Dietary counseling Body mass index is 35.72 kg/m.    20. Hematuria - resolved  9/22- required in/out to check U/A- having new hematuria- nursing to keep an eye- since also bleeding from penis a little- monitor   9/23 no wBCs  on UA, follow  urine  culture-Staphylococcus hemolyticus-discussed with pharmacy and ID pharmacy.  Think this is more likely contaminant  9/25 discuss with nephrology about possible treatment or other evaluation options  Spoke with Dr. Ronalee Belts, most likely contaminant.  Recommended treating only if symptomatic.  If hematuria continues consider urology input  9/27 urine has improved to clear yellow   21.  Vitamin C deficiency.  Aggressive repletion per psychiatry, 100 mg IV x 6+ 500 mg p.o. daily.  Feel this may help his cognition.  -04-22-09: Am noting some slightly improved attention, memory on gross exam since IV course; continue PO supplementation  23.  Chest pain a.m. 10-3.  EKG unchanged, self resolved.  -control bp as possible  24. Bilateral ankle pain. Changing locations, no known trauma, no instability, edema, or point tenderness. ?Peripheral neuropathy related; may benefit from Qutenza but would not add sedating medications for tx at this time.   -Resolved  LOS: 49 days A FACE TO FACE EVALUATION WAS PERFORMED  Fanny Dance 04/30/2023, 3:07 PM

## 2023-04-30 NOTE — Progress Notes (Signed)
Patient declined CPAP for the night

## 2023-04-30 NOTE — Progress Notes (Signed)
Occupational Therapy Session Note  Patient Details  Name: [name] [name] MRN: [id number] Date of Birth: October 06, 1950  Today's Date: 04/30/2023 OT Individual Time: 1135-1205 OT Individual Time Calculation (min): 30 min    Short Term Goals: Week 7:  OT Short Term Goal 1 (Week 7): LTG=STG 2/2 ELOS  Skilled Therapeutic Interventions/Progress Updates:    Pt greeted seated in wc with wife present trying to use the phone and asking for OT to take him to [employer] to get new shoes. OT able to redirect pt and take pt in wc to quiet hallway. Pt ambulated 4 bouts of 20 feet w/ RW and CGA. Pt overall pleasantly confused and easy to redirect at this time. Blocked practice for sit<>stands with focus on safe hand placement for stands. Pt returned to room and left seated in wc with alarm on, wife present, and needs met.   Therapy Documentation Precautions:  Precautions Precautions: Fall Restrictions Weight Bearing Restrictions: No Pain: Pain Assessment Pain Scale: 0-10 Pain Score: 0-No pain   Therapy/Group: Individual Therapy  [name] 04/30/2023, 12:00 PM

## 2023-04-30 NOTE — Progress Notes (Signed)
Pt was up most of the night with confusion and hallucination. Pt not following commands and safety measures. Pt climbed out of his bed multiple times and kept going back and forth  from bed to chair with intermittent confusion. This nurse had to stay in pt room most of the time to provide safety. Pt has been up till this morning resting from 2100 to midnight. Pt alert and verbal and very difficult to redirect.  Calub Tarnow,,,,,,,,,,,,,,,,,,,,,,,,,,,,,,RN

## 2023-04-30 NOTE — Progress Notes (Signed)
Physical Therapy Session Note  Patient Details  Name: [name] [name] MRN: [id number] Date of Birth: 03-29-1951  Today's Date: 04/30/2023 PT Individual Time: 0835-0900 PT Individual Time Calculation (min): 25 min   Short Term Goals: Week 7:  PT Short Term Goal 1 (Week 7): To continue to consistently meet LTG  Skilled Therapeutic Interventions/Progress Updates:    Pt presents in room in [facility], denies pain, states "I feel pretty good." Pt agreeable to PT. Session focused on gait training without WC to increase tolerance to upright, energy conservation and safety awareness for sitting.  Pt transported to day room dependently via WC. Pt ambulates 90', 86', and 37' with RW CGA. Pt provided with verbal cues for upright posture and RW mgmt with pt able to correct. Pt able to state when he feels unsafe (I.e. when RW is too far away or when he begins walking too fast) and able to request to sit at various sitting surfaces throughout ambulation path. Pt demonstrates improved upright posture, does require min cues for obstacle avoidance with pt demonstrating increased proximity to obstacles bilaterally however able to manage RW without additional assist.  Pt returns to room and remains seated in [facility] with all needs within reach, cal light in place and chair alarm donned and activated at end of session.  Therapy Documentation Precautions:  Precautions Precautions: Fall Restrictions Weight Bearing Restrictions: No    Therapy/Group: Individual Therapy  Edwin Cap PT, DPT 04/30/2023, 9:00 AM

## 2023-04-30 NOTE — Progress Notes (Signed)
[facility] Progress Note   Subjective:   Seen in room - sitting in wheelchair, calm at the moment. Denies CP/dyspnea.  Objective Vitals:   04/29/23 1932 04/30/23 0251 04/30/23 0826 04/30/23 0931  BP:  (!) 153/74  115/71  Pulse:  73  69  Resp:  18  18  Temp:  98.3 F (36.8 C)  98.3 F (36.8 C)  TempSrc:  Oral    SpO2: 94% 97% 96% 98%  Weight:      Height:       Physical Exam General: Well appearing man, confused. NAD Heart: RRR; no murmur Lungs: Bibasilar rales, clear in upper lobes Abdomen: soft Extremities: 1+ BLE edema Dialysis Access: LUE AVF + bruit  Additional Objective Labs: Basic Metabolic Panel: Recent Labs  Lab 04/24/23 1342 04/26/23 1330 04/28/23 1037  NA 133* 137 138  K 4.1 4.2 4.3  CL 94* 96* 97*  CO2 23 24 24   GLUCOSE 129* 103* 137*  BUN 60* 49* 68*  CREATININE 10.93* 10.77* 11.50*  CALCIUM 9.2 9.1 9.2  PHOS 4.9* 4.4 4.6   Liver Function Tests: Recent Labs  Lab 04/24/23 1342 04/26/23 1330 04/28/23 1037  ALBUMIN 2.6* 2.5* 2.5*   CBC: Recent Labs  Lab 04/24/23 1342 04/26/23 1330 04/28/23 1037  WBC 4.2 4.4 4.1  NEUTROABS  --   --  2.8  HGB 10.7* 10.5* 10.4*  HCT 34.3* 34.4* 33.8*  MCV 89.8 90.8 88.0  PLT 170 170 167   Medications:  sodium chloride 10 mL/hr at 04/18/23 1515    (feeding supplement) PROSource Plus  30 mL Oral BID BM   amLODipine  2.5 mg Oral Daily   ARIPiprazole  10 mg Oral QHS   ascorbic acid  500 mg Oral Daily   aspirin EC  81 mg Oral Daily   atorvastatin  80 mg Oral Daily   Chlorhexidine Gluconate Cloth  6 each Topical Q0600   darbepoetin (ARANESP) injection - DIALYSIS  150 mcg Subcutaneous Q Sun-1800   feeding supplement  237 mL Oral BID WC   [START ON 05/01/2023] gabapentin  300 mg Oral Q T,Th,Sa-HD   Gerhardt's butt cream  1 Application Topical QID   lidocaine  1 patch Transdermal Q2200   lidocaine  1 patch Transdermal Q24H   mometasone-formoterol  2 puff Inhalation BID   pantoprazole   40 mg Oral BID   traZODone  50 mg Oral QHS    Dialysis Orders: Previously on [facility] thru [facility] - changed to HD - will need outpatient HD unit established prior to d/c. 4hr, 3K, EDW ~94, L AVF    Assessment/Plan:  Debility - in CIR. Appropriateness for CIR nearing its end per NP.   Encephalopathy/agitation: Waxing/waning agitation, dementia, sundowning. No acute findings on imaging. Neruo and Psych consulting to help improve behavior. Requesting better documentation on behavior with HD. Patient with ongoing agitated and hallucinations. He didn't do well with HD on 10/9 and 10/12. Did not improve with wife at bedside previously. Patient requires one on one HD for his safety, which is not possible in outpatient setting.  Use of IV Ativan can not be done in OP HD units.  IR will not place catheter, agitation is not an indication to place catheter.  Considered switching to PD so patient can complete dialysis at home, but vascular surgery felt he was high risk for PD catheter placement. Meds being adjusted per primary team/psychiatry team.   ESRD: Transitioned from PD to HD - TTS schedule. Using  old AVF which is still functional. Coordinating with rehab team so they know to premedicate before HD, but he is not always willing to take his medications. Using emla cream to decrease pain with cannulation.  Hypertension/volume: BP ok - off midodrine, now on low dose amlodipine. + edema. Anemia: Continue Aranesp weekly. Last Tsat 38% with ferritin 2175.  No iron. Hgb ok. Metabolic bone disease: CorrCa and phos in goal. No VDRA.  Nutrition:  Alb low, continue supps.  Renal diet w/fluid restrictions.  Hx NSVT: no meds at this time. 10.  Hx culture negative peritonitis and associated fungemia + enterococcus bacteremia - underwent PD cath removal, TDC placement and removal, temp HD cath placement  and removal, and line holiday. Completed IV unasyn, IV micafungin and cipro on 8/15. TDC not replaced  cause he had functional AVF Dispo - ongoing issues with safety during HD making patient unable to discharge. Will need consistent safe behavior during HD to be candidate for outpatient.  Will also need to ensure able to tolerate in chair. He is not a candidate for LTAC per SW. Unfortunately our options are limited to essentially hospice or waiting to see if mental status improves. His wife does not want him to go to hospice. Looking at possible out of state facility if behavior not improved.  [name] Hoyle, PA-C 04/30/2023, 9:59 AM  [employer]

## 2023-05-01 LAB — CBC
HCT: 30.7 % — ABNORMAL LOW (ref 39.0–52.0)
Hemoglobin: 9.5 g/dL — ABNORMAL LOW (ref 13.0–17.0)
MCH: 27.9 pg (ref 26.0–34.0)
MCHC: 30.9 g/dL (ref 30.0–36.0)
MCV: 90 fL (ref 80.0–100.0)
Platelets: 137 10*3/uL — ABNORMAL LOW (ref 150–400)
RBC: 3.41 MIL/uL — ABNORMAL LOW (ref 4.22–5.81)
RDW: 15.9 % — ABNORMAL HIGH (ref 11.5–15.5)
WBC: 3.8 10*3/uL — ABNORMAL LOW (ref 4.0–10.5)
nRBC: 0 % (ref 0.0–0.2)

## 2023-05-01 LAB — HEPATITIS B SURFACE ANTIBODY, QUANTITATIVE: Hep B S AB Quant (Post): <3.5 m[IU]/mL — ABNORMAL LOW

## 2023-05-01 MED ORDER — HEPARIN SODIUM (PORCINE) 1000 UNIT/ML IJ SOLN
INTRAMUSCULAR | Status: AC
  Filled 2023-05-01: qty 4

## 2023-05-01 NOTE — Progress Notes (Signed)
Speech Language Pathology Daily Session Note  Patient Details  Name: [name] [name] MRN: [id number] Date of Birth: [birth year]  Today's Date: 05/01/2023 SLP Individual Time: 1045-1130 SLP Individual Time Calculation (min): 45 min  Short Term Goals: Week 7: SLP Short Term Goal 1 (Week 7): Patient will utilize external aids to orient to time and situation given modA SLP Short Term Goal 2 (Week 7): Pt will complete functional problem solving with 60% acc with max A. SLP Short Term Goal 3 (Week 7): Pt will sustain attention to functional task for 10 minutes given mod multimodal cues. SLP Short Term Goal 4 (Week 7): Pt will answer biographical questions with 90% accuracy with mod A multimodal cues.  Skilled Therapeutic Interventions:  Pt was seen in am to address cognitive re- training. Pt was alert and seated upright in WC upon SLP arrival. Wife present and participating intermittently throughout session. Pt answered biographical questions including pt name, DOB, and location with mod I. He also answered orientation concepts given use of external visual aid with mod A. MD in and out during session and verbalizing earlier dialysis date. Pt challenged throughout remainder of session in orientation concepts including dialysis date, current day of week, and updated dialysis time. Pt completed recall of aforementioned information with 67% acc with mod A. SLP also addressed problem solving within current environment including identifying uses of call button and need for assist. Pt required max A for 33% acc during prob solving task. In additional minutes of session, SLP challenged pt in identifying information on board (I.e. MD name, nurse, and dialysis days). Pt left upright in [facility] with wife present, call button within reach, and chair alarm active. SLP to continue POC.   Pain Pain Assessment Pain Scale: 0-10 Pain Score: 0-No pain  Therapy/Group: Individual Therapy  Renaee Munda 05/01/2023, 3:49  PM

## 2023-05-01 NOTE — Progress Notes (Signed)
Pt noted with confusion and hallucination while sleeping. Very difficult to stay asleep in bed. Refused CPAP at HS.Marland Kitchen Slept couple of hours from (4hrs) with staff in his room to redirect him. Pt not following safety precaution and very difficult to redirect. Alert and verbal this am with no change in his behavior.

## 2023-05-01 NOTE — Progress Notes (Signed)
Occupational Therapy Session Note  Patient Details  Name: [name] [name] MRN: [id number] Date of Birth: [birth year]  Today's Date: 05/01/2023 OT Individual Time: 2130-8657 OT Individual Time Calculation (min): 25 min    Short Term Goals: Week 7:  OT Short Term Goal 1 (Week 7): LTG=STG 2/2 ELOS  Skilled Therapeutic Interventions/Progress Updates:    Pt greeted seated on toilet in bathroom, handoff from nurse tech. Pt able to complete peri-care himself after successful BM. PT still with poor safety awareness overall. Pt attempted to then sit on shower seat but stated he did not want to shower. OT redirected him out of bathroom to wc at the sink where he initiated, sequenced, and completed face washing and thoothbrushing task with only cue for turning off water. Pt then ambulated to doorway and handoff to PT for next therapy session.   Therapy Documentation Precautions:  Precautions Precautions: Fall Restrictions Weight Bearing Restrictions: No General:   Vital Signs: Therapy Vitals Temp: 98.7 F (37.1 C) Pulse Rate: 70 Resp: 20 BP: 136/76 Patient Position (if appropriate): Lying Oxygen Therapy SpO2: 100 % O2 Device: Room Air Pain:  Denies pain   Therapy/Group: Individual Therapy  [name] 05/01/2023, 9:09 AM

## 2023-05-01 NOTE — Progress Notes (Signed)
PROGRESS NOTE   Subjective/Complaints  Overnight, :  Pt noted with confusion and hallucination while sleeping. Very difficult to stay asleep in bed. Refused CPAP at HS.Marland Kitchen Slept couple of hours from (4hrs) with staff in his room to redirect him. Pt not following safety precaution and very difficult to redirect. Alert and verbal this am with no change in his behavior.            No acute complaints this AM. States he slept well. Is somewhat lethargic and slow to respond but overall appropriate. Wife at bedside, all questions answered. To try dialysis a bit earlier today to accommodate worsening at bedtime behaviors.   ROS: Limited by cognition.  Denies fevers, chills, N/V, abdominal pain, constipation, diarrhea, SOB, cough, chest pain, joint pain, new weakness or paraesthesias.  Pertinent positives per HPI above.  Objective:   No results found. No results for input(s): "WBC", "HGB", "HCT", "PLT" in the last 72 hours.       No results for input(s): "NA", "K", "CL", "CO2", "GLUCOSE", "BUN", "CREATININE", "CALCIUM" in the last 72 hours.        Intake/Output Summary (Last 24 hours) at 05/01/2023 1045 Last data filed at 05/01/2023 0836 Gross per 24 hour  Intake 240 ml  Output --  Net 240 ml           Physical Exam: Vital Signs Blood pressure 136/76, pulse 70, temperature 98.7 F (37.1 C), resp. rate 20, height 5\' 4"  (1.626 m), weight 94.4 kg, SpO2 100%.   Constitutional: No distress . Vital signs reviewed.  Sitting in WC.  HEENT: NCAT, EOMI, oral membranes moist Cardiovascular: RRR without murmur. No JVD .  No peripheral edema. Respiratory/Chest: CTA Bilaterally without wheezes or rales. Normal effort    GI/Abdomen: BS +, non-tender, non-distended Ext: no clubbing, cyanosis, or edema.  Psych: Cooperative,  pleasant, appropriate.  Skin: No evidence of breakdown, no evidence of rash.  Left upper extremity AV  fistula  Neurologic: Alert and awake  + Cognitive deficits - oriented x3 today except for day of the week (wed).  +Lethargy, awake and responsive but slowed today cranial nerves II through XII grossly intact, Moving all 4 extremities antigravity  and against resistance, 4/5 throughout,  - no bilateral upper extremity tremors today Musculoskeletal: No obvious deformities.   No joint swelling.   Assessment/Plan: 1. Functional deficits which require 15 hr per week of interdisciplinary therapy in a comprehensive inpatient rehab setting. Physiatrist is providing close team supervision and 24 hour management of active medical problems listed below. Physiatrist and rehab team continue to assess barriers to discharge/monitor patient progress toward functional and medical goals  Care Tool:  Bathing    Body parts bathed by patient: Right arm, Left arm, Chest, Abdomen, Front perineal area, Right upper leg, Buttocks, Left upper leg, Right lower leg, Left lower leg, Face   Body parts bathed by helper: Right arm, Left arm, Chest, Abdomen, Front perineal area, Buttocks, Right upper leg, Left upper leg, Right lower leg, Left lower leg, Face     Bathing assist Assist Level: Moderate Assistance - Patient 50 - 74%     Upper Body Dressing/Undressing Upper body dressing  What is the patient wearing?: Pull over shirt    Upper body assist Assist Level: Minimal Assistance - Patient > 75%    Lower Body Dressing/Undressing      Lower body dressing      What is the patient wearing?: Underwear/pull up, Pants     Lower body assist Assist for lower body dressing: Moderate Assistance - Patient 50 - 74%     Toileting Toileting    Toileting assist Assist for toileting: Minimal Assistance - Patient > 75%     Transfers Chair/bed transfer  Transfers assist     Chair/bed transfer assist level: Contact Guard/Touching assist     Locomotion Ambulation   Ambulation assist      Assist  level: 2 helpers Assistive device: Walker-rolling Max distance: 90'   Walk 10 feet activity   Assist     Assist level: 2 helpers Assistive device: Hand held assist   Walk 50 feet activity   Assist Walk 50 feet with 2 turns activity did not occur: Safety/medical concerns  Assist level: Minimal Assistance - Patient > 75% Assistive device: No Device    Walk 150 feet activity   Assist Walk 150 feet activity did not occur: Safety/medical concerns  Assist level: Minimal Assistance - Patient > 75% Assistive device: No Device    Walk 10 feet on uneven surface  activity   Assist Walk 10 feet on uneven surfaces activity did not occur: Safety/medical concerns         Wheelchair     Assist Is the patient using a wheelchair?: Yes Type of Wheelchair: Manual    Wheelchair assist level: Dependent - Patient 0%      Wheelchair 50 feet with 2 turns activity    Assist        Assist Level: Dependent - Patient 0%   Wheelchair 150 feet activity     Assist      Assist Level: Dependent - Patient 0%   Blood pressure 136/76, pulse 70, temperature 98.7 F (37.1 C), resp. rate 20, height 5\' 4"  (1.626 m), weight 94.4 kg, SpO2 100%.  Medical Problem List and Plan: 1. Functional deficits secondary to metabolic encephalopathy due to sepsis secondary to catheter peritonitis and hypoxemia              -patient may not shower             -ELOS/Goals: 16-18 days, PT/OT Sup, SLP min A - DC goal 10/21   - Per wife, patient will need to be SPV/CGA level with rolling walker for return home.   - 10/11: Long discussion with wife, by 10/15 we will need to dispo plan home with [facility] and ER for HD vs. to SNF with HD capabilities.  Currently, no options for home or OP dialysis.  Continuing to work on improving behaviors so that he may qualify for outpatient dialysis.  Not considering hospice/palliative at this time.  -10/15: Team thinks CGA with RW may be realistic given  improvements over the last week. Will get family training this week.             -Continue CIR therapies including PT, OT, and SLP   -[facility] -- encephalopathy/aggitation has developed since approximately 8/22, with decline earlier following COVID infection per wife   -Therapy QD schedule  - Con't CIR PT, OT and SLP  - Patient was seen by palliative care, changed to DNR status -Marcelino Duster with palliative continuing to follow  -Tried wife  at bedside with dialysis on 10-3, patient remained agitated and pulling at lines, per nephrology not a candidate for outpatient hemodialysis.     10/6 vascular is not recommending conversion to PD.   10-7: Palliative signed off  2.  Antithrombotics: -DVT/anticoagulation:  Pharmaceutical: Heparin, 03/23/23 changed to Lovenox -  consistent refusal -  started on Eliquis 2.5 mg BID - Dced 9/12 per cardiology "With patient's chronic anemia and agitation/recent falls, appears that risk of OAC>benefits at this time... ". SCDs at bedtime added 10/4.              -antiplatelet therapy: aspirin and Plavix --> ASA alone per cardiology 9/11   3. Pain Management:              -Lidoderm patches to back, Tylenol every 4 hours as needed's, muscle rub as needed, and oxycodone 2.5 mg 3 times daily as needed  -Elavil 25 mg at bedtime for nerve pain and sleep 8/27 -weaned off 9-7 due to oversedation  - No use of PRNs or general complaints of pain since 9-29  4.  Lethargy/ intermittent agitation /insomnia: Persistent despite interventions  -Weaned off of veil bed, remains on telesitter for safety.  Appears somewhat better with wife at bedside and when seated at the station.             -Admitted on Seroquel 50 mg TID -weaned off             -clonazepam 0.5 mg TID prn -transition to lorazepam 0.5 mg daily p.o. or IV as needed for anxiety, agitation -10-4 psychiatry premedicating dialysis with1 mg ativan -Currently have Atarax first-line and Ativan 1 mg  scheduled predialysis for agitation; redirect and avoid overstimulation as much as possible.   - Avoid increasing sedating medications as much as possible, as there is a direct effect on patient's sleep, confusion, and agitation up to a day after receiving PRN antipsychotics and Ativan.              - Dced Geodon 10 mg IM q 12 hours prn d/t  switched to Abilify per psychiatry 9-17             -8-28 added depakote 125 mg q8h for ongoing aggitation, mood stabilization -discontinued 9-2, resumed 9-21, increased 9-23 - level 18 subtherapeutic increased to 250 mg Q12h 10/4 -> psych changing to long-acting to 250 mg nightly 10-6; repeat levels 10-8 - <10  - 10-8: D/w psych adjusting vs. Dcing depakote; in agreement with plan, Depakote Dc'd 10/9   - 9-17 Abilify started per psychiatry, increased to 7.5 mg 9-27  - 9-24 memantine 5 mg daily started per psychiatry, discontinued 10-3 per family request due to increasing confusion and lethargy -Psychiatry following, appreciate their assistance and recommendations in managing this patient - Responsive to low stimulating environment, music, and redirection.  Remains intermittently agitated, especially with dialysis and at nighttime, but is significantly improving over the weeks.   10/8: Poor sleep overnight, remains pleasant and cooperative today and appreciate overall slight improvement in behaviors this week. 10/9: Neurology noting pre-existing cognitive deficits documented on the outpatient side; while these are significantly worse, may be beneficial to reach out to other family members regarding his prior cognitive baseline.  Will also look into family bringing in CPAP. 10/12: CPAP ordered.  Psych reccommending possibly increasing abilify next week if tolerated. D/t no PRN use in some time, DC at bedtime scheduled trazadone to limit psychoactive medication burden.   10/13: Poor sleep, resume  trazadone 50 mg at bedtime.  As seen with prior administrations, believe  predialysis Ativan is causing significant lethargy and confusion up to a day after administration 10/15: 4 hours sleep overnight; remains impulsive but no PRNs needed. See how dialysis goes today, may increase abilify tomorrow.   5. Hypoactive delirium/metabolic encephalopathy: This patient is not capable of making decisions on his own behalf.  Persistent despite significant efforts to workup/treat.  -  8/29: EEG negative, 8-30: MRI showing chronic microvascular changes, otherwise normal.  Multiple lab workups for encephalopathy with CMP, ammonia, CBC negative except for low VPA.   -  10-2: Lethargic, jerking , BG 70s, improved with POs.  Resume blood glucose check twice daily. Repeat VPA levels (18).  CMP CBC look okay.    -10/8: Neuro also to eval as above; appreciate their assistance and recommendations. -recommending discontinuation of Atarax and Ativan; will consider if he is able to tolerate transition to dialysis chair. Appreciate their assistance and recommendations.  Folate, B12, and TSH normal.   - 10/13: Will add 300 mg gabapentin to pre-dialysis medications to reduce discomfort  during treatments. Since it appears ativan wears off halfway through session, will change to PRN 0.5 mg during dialysis only to target behaviors.  Will discuss with nephrology team before Tuesday dialysis administering as soon as patient shows any signs of  irritability.  -10/14 Depakote restart by psychiatry today, monitor response - more tired and remained disinhibited overnight 10/15   6. Skin/Wound Care: Routine skin care checks             -sacral skin off-loading/protection   7. Fluids/Electrolytes/Nutrition: Strict Is and Os and follow-up chemistries per nephrology             - renal diet/ 1200 cc FR  - Renvela 800 mg 3 times daily for hyperphosphatemia added per nephrology 8-27   - Management per nephrology, labs appear approximately stable, p.o. intakes are appropriate at this time but wax and wane with  lethargy   - 10/2-10: Mild hyponatremia 134-133 recently; is not worsening, has dipped into this range before, no apparent cognitive effects. Continue management with dialysis. 10/10 - resolved    8: Hypotension/Hypertension: monitor TID and prn             -continue midodrine as needed during dialysis  9-1: add midodrine 5 mg tid and monitor.  DC Coreg as heart rate has been low to normal; may resume at low-dose once blood pressure improved.  9/13 DC midodrine   - Intermittent hypertension into the 150s, is higher on off dialysis days.  Would maintain higher perfusion pressure given microvascular changes seen on MRI.  No medication adjustments for now.  10-7 - 10/9 well-controlled, with intermittent transient elevations.  10/11: Add amlodipine 2.5 mg daily for consistent mild elevations--> normotensive 10/12-13  -10/14 BP controlled overall, continue current regimen    05/01/2023    6:07 AM 04/30/2023    7:39 PM 04/30/2023    1:16 PM  Vitals with BMI  Systolic 136 135 161  Diastolic 76 63 68  Pulse 70  73     9: Hyperlipidemia: continue statin   10: CAD s/p CABG 2023: on DAPT and statin.  - troponin elevated but stable, monitor 8/27   11: DM-2: A1c = 6.3% (at home on Lantus 22 units at bedtime, Tradjenta 5 mg daily)        -monitor PO intake             -  continue SSI (0-6 units) does not appear to be requiring coverage   - 9/6 DC SSI, change CBGs to daily   - 10-2: Hypoglycemic today, 70s, improved with p.o. intakes, 88 at recheck.  Likely due to poor p.o. intakes last few days.  Start CBG checks twice daily  - 10-4: Patient agitated with CBG checks, refusing.  Moved to as needed for lethargy, AM  -10/14 glucose stable on most recent BMP   12: ESRD: HD on Tues, Thurs, Sat -nephrology made aware  -Regularly have to hold dialysis or stopped early due to agitated behaviors, which is ongoing.  Recently has been able to receive all or part of most dialysis sessions.  -Have tried  premedicating patient and providing as needed medications with Haldol, Ativan as available on dialysis unit; measures helped temporarily but overall do not seem to affect treatment  -Wife at bedside for dialysis 10-3, did not improve behaviors, per nephrology patient is not a candidate for outpatient dialysis.     -10/5-6 see discussion above. Wife tries to help in HD. However, behaviors continue to make HD difficult.  -Unfortunately, vascular not offering conversion to PD.  Will discuss possible options with nephrology.    - 10/8:  Nephro to proved detailed notes on dialysis behaviors this week to target; goal to be OP HD candidate -agitated 10-10 and 10/12 session, continue to work on behaviors as above  10/15: trialling earlier dialysis session today  13: COPD/OSA; chronic home 2L O2 (Symbicort, Ventolin, prednisone 40 mg daily at home)             -continue symbicort  -Added home oxygen back 8-27  -Satting well on room air, oxygen not required   -10/11: Patient's wife reports home CPAP machine not usable; RT consult to resume nighttime CPAP   - not using   14: GERD: continue Protonix   15: Non-sustained VT/pAF/bradycardia: resolved. per cardiology, Dr. Algie Coffer: -continue carvedilol 6.25 mg BID; consider increasing to 12.5 mg BID before starting low dose amiodarone  -consider Eliquis and discontinue asa and Plavix in one week if no active GI bleeding and stable hemoglobin from 8/24 -hemoglobin has remained stable but very low around 7, no recurrent A-fib, monitor for now given agitation/fall 8-31 and consider initiating Eliquis when safe -Some intermittent recurrent bradycardia while at inpatient rehab, improved with discontinuation of Elavil and Geodon -9/11 EKG for bradycardia episode today, 2nd degree A-V block mobitz type 1.  Continue to hold beta blocker,9/11 will restart eliquis at 2.5 mg BID dose. Will consult cardiology regarding need for ASA/plavix and coreg.  Dr. Elease Hashimoto does not  appear convinced that he has A-fib 9/12 per cardiology, DAPT transition to daily aspirin 81 only and Eliquis was discontinued - Heart rate currently well-controlled, hypertension as above, continuing to hold Eliquis due to falls/poor safety awareness, starting SCDs as above   16: HFrEF: Daily weights fluctuating significantly d/t dialysis. Monitor for signs of overload. No current concerns.    17: Liver nodularity on CT scan: LFTs reported to be normal. Avoid hepatotoxic medications    -Abdominal ultrasound earlier this admission with minimal ascites  -Ammonia levels have remained normal on multiple checks   18: Epistaxis - resolved             -previously improved with flonase and saline spray   19: Morbid Obesity. Dietary counseling Body mass index is 35.72 kg/m.    20. Hematuria - resolved  9/22- required in/out to check U/A- having new hematuria- nursing  to keep an eye- since also bleeding from penis a little- monitor   9/23 no wBCs  on UA, follow  urine culture-Staphylococcus hemolyticus-discussed with pharmacy and ID pharmacy.  Think this is more likely contaminant  9/25 discuss with nephrology about possible treatment or other evaluation options  Spoke with Dr. Ronalee Belts, most likely contaminant.  Recommended treating only if symptomatic.  If hematuria continues consider urology input  9/27 urine has improved to clear yellow   21.  Vitamin C deficiency.  Aggressive repletion per psychiatry, 100 mg IV x 6+ 500 mg p.o. daily.  Feel this may help his cognition.  -04-22-09: Am noting some slightly improved attention, memory on gross exam since IV course; continue PO supplementation  23.  Chest pain a.m. 10-3.  EKG unchanged, self resolved.  -control bp as possible  24. Bilateral ankle pain. Changing locations, no known trauma, no instability, edema, or point tenderness. ?Peripheral neuropathy related; may benefit from Qutenza but would not add sedating medications for tx at this  time.   -Resolved  LOS: 50 days A FACE TO FACE EVALUATION WAS PERFORMED  Angelina Sheriff 05/01/2023, 10:45 AM

## 2023-05-01 NOTE — Procedures (Signed)
HD Note:  Some information was entered later than the data was gathered due to patient care needs. The stated time with the data is accurate.  Received patient in bed to unit.   Alert and oriented.   Informed consent signed and in chart.   Access used: Left Upper chest fistula Access issues: None  Patient tolerated treatment well. Patient was satisfied being in the chair.  He stated he prefers this to the bed.  He remained lucid during treatment.  The treatment was ordered for 4 hours.  The patient wanted to finish treatment about 10 min.  He wanted to sit up and did put his feet on the floor with the chair foot rest still up.  We agreed.  TX duration: 3 hours and 50 min  Alert, without acute distress.  Total UF removed: 3300 ml  Hand-off given to patient's nurse.   Transported back to the room   Darran Gabay L. Dareen Piano, RN [facility].

## 2023-05-01 NOTE — Progress Notes (Signed)
Patient ID: [name] [name], male   DOB: [birth year], 73 y.o.   MRN: [id number]  SW made efforts to meet with pt wife, but no one in room.Today is a dialysis day. SW called pt wife but no voicemail set up. SW will continue to make efforts to make contact.   Cecile Sheerer, MSW, LCSWA Office: [phone] Cell: [phone] Fax: [phone]

## 2023-05-01 NOTE — Progress Notes (Signed)
Physical Therapy Session Note  Patient Details  Name: QUANDARIUS NILL MRN: [id number] Date of Birth: 1950-09-13  Today's Date: 05/01/2023 PT Individual Time: 0915-0940 PT Individual Time Calculation (min): 25 min   Short Term Goals: Week 7:  PT Short Term Goal 1 (Week 7): To continue to consistently meet LTG  Skilled Therapeutic Interventions/Progress Updates:    Pt presents in room ambulating with OT, handoff from OT in hallway. Pt returns to sitting on WC and transported from room to dayroom dependently. Session focused on gait training and safety awareness with managing energy conservation during ambulation. Pt completes sit<>stands with CGA to RW throughout session.  Pt ambulates 76', 115', and 57'. On second trial with longest walk pt requires standing rest break leaning against wall due to fatigue but able to ambulate the rest of the way to a chair to sit with cues and min assist due to decreased proximity to RW with fatigue. Pt requires extended seated rest break ~8 minutes due to fatigue prior to ambulating back to [facility] in day room. Pt continues to demonstrate improved safety awareness and decreased impulsive sitting.  Pt returns to sitting in WC, positioned with chair alarm donned and activated and remains seated behind nurse's station for supervision at end of session.  Therapy Documentation Precautions:  Precautions Precautions: Fall Restrictions Weight Bearing Restrictions: No   Therapy/Group: Individual Therapy  Edwin Cap 05/01/2023, 1:00 PM

## 2023-05-01 NOTE — Progress Notes (Signed)
[facility] Progress Note   Subjective:   Seen during rehab session - sitting in wheelchair, calm. No dyspnea/CP. D/w rehab PA - there is not a good plan for him - possibly a little better, esp in morning, still with significant sundowning. Will attempt HD as early as possible during day to minimize this affecting his treatments - try to dialyze in recliner. Family not willing to pursue hospice at this time, so will continue to move towards goals for  discharge.  Objective Vitals:   04/30/23 1939 04/30/23 2026 05/01/23 0607 05/01/23 0840  BP: 135/63  136/76   Pulse:   70   Resp: 18  20   Temp: 98.1 F (36.7 C)  98.7 F (37.1 C)   TempSrc:      SpO2: 97% 95% 100% 100%  Weight:      Height:       Physical Exam General: Well appearing man, confused. NAD, + facial edema Heart: RRR; no murmur Lungs: Bibasilar rales, clear in upper lobes Abdomen: soft Extremities: 2+ BLE edema Dialysis Access: LUE AVF + bruit  Additional Objective Labs: Basic Metabolic Panel: Recent Labs  Lab 04/24/23 1342 04/26/23 1330 04/28/23 1037  NA 133* 137 138  K 4.1 4.2 4.3  CL 94* 96* 97*  CO2 23 24 24   GLUCOSE 129* 103* 137*  BUN 60* 49* 68*  CREATININE 10.93* 10.77* 11.50*  CALCIUM 9.2 9.1 9.2  PHOS 4.9* 4.4 4.6   Liver Function Tests: Recent Labs  Lab 04/24/23 1342 04/26/23 1330 04/28/23 1037  ALBUMIN 2.6* 2.5* 2.5*   CBC: Recent Labs  Lab 04/24/23 1342 04/26/23 1330 04/28/23 1037  WBC 4.2 4.4 4.1  NEUTROABS  --   --  2.8  HGB 10.7* 10.5* 10.4*  HCT 34.3* 34.4* 33.8*  MCV 89.8 90.8 88.0  PLT 170 170 167   Medications:  sodium chloride 10 mL/hr at 04/18/23 1515    (feeding supplement) PROSource Plus  30 mL Oral BID BM   amLODipine  2.5 mg Oral Daily   ARIPiprazole  10 mg Oral QHS   ascorbic acid  500 mg Oral Daily   aspirin EC  81 mg Oral Daily   atorvastatin  80 mg Oral Daily   Chlorhexidine Gluconate Cloth  6 each Topical Q0600   darbepoetin (ARANESP)  injection - DIALYSIS  150 mcg Subcutaneous Q Sun-1800   divalproex  250 mg Oral QHS   feeding supplement  237 mL Oral BID WC   gabapentin  300 mg Oral Q T,Th,Sa-HD   Gerhardt's butt cream  1 Application Topical QID   lidocaine  1 patch Transdermal Q2200   lidocaine  1 patch Transdermal Q24H   mometasone-formoterol  2 puff Inhalation BID   pantoprazole  40 mg Oral BID   traZODone  50 mg Oral QHS    Dialysis Orders: Previously on [facility] thru [facility] - changed to HD - will need outpatient HD unit established prior to d/c. 4hr, 3K, EDW ~94, L AVF    Assessment/Plan:   Debility - in CIR. Appropriateness for CIR nearing its end per NP.   Encephalopathy/agitation: Waxing/waning agitation, dementia, sundowning. No acute findings on imaging. [place]eruo and Psych consulting to help improve behavior. Requesting better documentation on behavior with HD. Patient with ongoing agitated and hallucinations. He didn't do well with HD on 10/9 and 10/12. Did not improve with wife at bedside previously. Patient requires one on one HD for his safety, which is not possible in outpatient setting.  Use of IV Ativan can not be done in OP HD units.  IR will not place catheter, agitation is not an indication to place catheter.  Considered switching to PD so patient can complete dialysis at home, but vascular surgery felt he was high risk for PD catheter placement. Meds being adjusted per primary team/psychiatry team.   ESRD: Transitioned from PD to HD - TTS schedule. Using old AVF which is still functional. Coordinating with rehab team so they know to premedicate before HD, but he is not always willing to take his medications. Using emla cream to decrease pain with cannulation. TRYING HD TODAY AS EARLY AS POSSIBLE IN RECLINER - SEE HOW DOES. Hypertension/volume: BP ok - off midodrine, now on low dose amlodipine. + edema. Anemia: Continue Aranesp weekly. Last Tsat 38% with ferritin 2175.  No iron. Hgb  ok. Metabolic bone disease: CorrCa and phos in goal. No VDRA.  Nutrition:  Alb low, continue supps.  Renal diet w/fluid restrictions.  Hx NSVT: no meds at this time. Hx culture negative peritonitis and associated fungemia + enterococcus bacteremia - underwent PD cath removal, TDC placement and removal, temp HD cath placement  and removal, and line holiday. Completed IV unasyn, IV micafungin and cipro on 8/15. TDC not replaced cause he had functional AVF Dispo - ongoing issues with safety during HD making patient unable to discharge. Will need consistent safe behavior during HD to be candidate for outpatient.  Will also need to ensure able to tolerate in chair. He is not a candidate for LTAC per SW. Unfortunately our options are limited to essentially hospice or waiting to see if mental status improves. His wife does not want him to go to hospice. Looking at possible out of state facility if behavior not improved.  Ozzie Hoyle, PA-C 05/01/2023, 10:00 AM  [employer]

## 2023-05-02 LAB — HEPATITIS B CORE ANTIBODY, IGM: Hep B C IgM: NONREACTIVE

## 2023-05-02 NOTE — Progress Notes (Signed)
Occupational Therapy Session Note  Patient Details  Name: [name] MRN: [id number] Date of Birth: 1950-08-25  Today's Date: 05/02/2023 OT Individual Time: 0830-0900 OT Individual Time Calculation (min): 30 min    Short Term Goals: Week 1:  OT Short Term Goal 1 (Week 1): Pt will will complete 1 step of UB dressing task OT Short Term Goal 1 - Progress (Week 1): Met OT Short Term Goal 2 (Week 1): Patient will stand with min A in preparation for BADL task OT Short Term Goal 2 - Progress (Week 1): Met OT Short Term Goal 3 (Week 1): Pt will wash two body parts with min cues OT Short Term Goal 3 - Progress (Week 1): Met Week 2:  OT Short Term Goal 1 (Week 2): Patient will complete toilet transfer with min A of 1 OT Short Term Goal 1 - Progress (Week 2): Met OT Short Term Goal 2 (Week 2): Patient will complete LB dressing with min A OT Short Term Goal 2 - Progress (Week 2): Progressing toward goal OT Short Term Goal 3 (Week 2): Patient will complete UB dressing with min A OT Short Term Goal 3 - Progress (Week 2): Met Week 3:  OT Short Term Goal 1 (Week 3): LTG=STG 2/2 ELOS Week 4:  OT Short Term Goal 1 (Week 4): To continue to consistently meet LTG. Week 5:  OT Short Term Goal 1 (Week 5): Working to consistently meet LTG Week 6:  OT Short Term Goal 1 (Week 6): LTG=STG 2/2 ELOS Week 7:  OT Short Term Goal 1 (Week 7): LTG=STG 2/2 ELOS Week 8:   Week 9:    Skilled Therapeutic Interventions/Progress Updates:    1:1 Pt received at the sink. Pt initiated and participated in grooming tasks (tooth brushing, face washing and brushing hair and beard) mod I without cues for assistance. Pt orientated to place, month, year (not day of the month) and oriented to situation - why  he came to the hospital. He recalled going to HD last night. Pt engaged in eating breakfast with setup for fixing food with condiments. Pt still presents with ongoing tremors - noticeable to patients.   Therapy  Documentation Precautions:  Precautions Precautions: Fall Restrictions Weight Bearing Restrictions: No  Pain:  No c/o pain in session   Therapy/Group: Individual Therapy  [name] [facility] 05/02/2023, 3:27 PM

## 2023-05-02 NOTE — Progress Notes (Signed)
Physical Therapy Session Note  Patient Details  Name: [name] [name] MRN: [id number] Date of Birth: 1950-10-11  Today's Date: 05/02/2023 PT Missed Time: 30 Minutes Missed Time Reason: Patient unwilling to participate  Short Term Goals: Week 7:  PT Short Term Goal 1 (Week 7): To continue to consistently meet LTG  Skilled Therapeutic Interventions/Progress Updates:     Pt politely declines therapy at this time. PT will follow up as able.   Therapy Documentation Precautions:  Precautions Precautions: Fall Restrictions Weight Bearing Restrictions: No   Therapy/Group: Individual Therapy  Beau Fanny, PT, DPT 05/02/2023, 4:43 PM

## 2023-05-02 NOTE — Progress Notes (Signed)
PROGRESS NOTE   Subjective/Complaints  Patient toelrated dialysis better without aggitation yesterday. Did have ongoing confusion overnight but redirectable. Some mild tachypnea during tx documented, otherwise vitals WNL. No PRN atarax or ativan used!  D/w nephrology, given significant behavioral improvements will continue dialysis in chair earlier in the day, and referral back to [facility].     ROS: Limited by cognition.  Denies fevers, chills, N/V, abdominal pain, constipation, diarrhea, SOB, cough, chest pain, joint pain, new weakness or paraesthesias.  Pertinent positives per HPI above.  Objective:   No results found. Recent Labs    05/01/23 1322  WBC 3.8*  HGB 9.5*  HCT 30.7*  PLT 137*         No results for input(s): "NA", "K", "CL", "CO2", "GLUCOSE", "BUN", "CREATININE", "CALCIUM" in the last 72 hours.        Intake/Output Summary (Last 24 hours) at 05/02/2023 0834 Last data filed at 05/01/2023 1759 Gross per 24 hour  Intake 480 ml  Output 3300 ml  Net -2820 ml           Physical Exam: Vital Signs Blood pressure 131/68, pulse 66, temperature 97.8 F (36.6 C), resp. rate 18, height 5\' 4"  (1.626 m), weight 94.4 kg, SpO2 97%.   Constitutional: No distress . Vital signs reviewed.  Sitting at bedside eating breakfast HEENT: NCAT, EOMI, oral membranes moist Cardiovascular: RRR without murmur. No JVD .  No peripheral edema. Respiratory/Chest: CTA Bilaterally without wheezes or rales. Normal effort    GI/Abdomen: BS +, non-tender, non-distended Ext: no clubbing, cyanosis, or edema.  Psych: Cooperative,  pleasant, appropriate.  Skin: No evidence of breakdown, no evidence of rash.  Left upper extremity AV fistula. +RUE IV.   Neurologic: Alert and awake + Cognitive deficits - oriented x3 today, uses calendar to correct himself on day/month/year +Lethargy, awake and responsive but slowed -  ongoing cranial nerves II through XII grossly intact, Moving all 4 extremities antigravity  and against resistance, 4/5 throughout,  - + bilateral upper extremity tremors today, mild Musculoskeletal: No obvious deformities.   No joint swelling.   Assessment/Plan: 1. Functional deficits which require 15 hr per week of interdisciplinary therapy in a comprehensive inpatient rehab setting. Physiatrist is providing close team supervision and 24 hour management of active medical problems listed below. Physiatrist and rehab team continue to assess barriers to discharge/monitor patient progress toward functional and medical goals  Care Tool:  Bathing    Body parts bathed by patient: Right arm, Left arm, Chest, Abdomen, Front perineal area, Right upper leg, Buttocks, Left upper leg, Right lower leg, Left lower leg, Face   Body parts bathed by helper: Right arm, Left arm, Chest, Abdomen, Front perineal area, Buttocks, Right upper leg, Left upper leg, Right lower leg, Left lower leg, Face     Bathing assist Assist Level: Moderate Assistance - Patient 50 - 74%     Upper Body Dressing/Undressing Upper body dressing   What is the patient wearing?: Pull over shirt    Upper body assist Assist Level: Minimal Assistance - Patient > 75%    Lower Body Dressing/Undressing      Lower body dressing  What is the patient wearing?: Underwear/pull up, Pants     Lower body assist Assist for lower body dressing: Moderate Assistance - Patient 50 - 74%     Toileting Toileting    Toileting assist Assist for toileting: Minimal Assistance - Patient > 75%     Transfers Chair/bed transfer  Transfers assist     Chair/bed transfer assist level: Contact Guard/Touching assist     Locomotion Ambulation   Ambulation assist      Assist level: 2 helpers Assistive device: Walker-rolling Max distance: 90'   Walk 10 feet activity   Assist     Assist level: 2 helpers Assistive  device: Hand held assist   Walk 50 feet activity   Assist Walk 50 feet with 2 turns activity did not occur: Safety/medical concerns  Assist level: Minimal Assistance - Patient > 75% Assistive device: No Device    Walk 150 feet activity   Assist Walk 150 feet activity did not occur: Safety/medical concerns  Assist level: Minimal Assistance - Patient > 75% Assistive device: No Device    Walk 10 feet on uneven surface  activity   Assist Walk 10 feet on uneven surfaces activity did not occur: Safety/medical concerns         Wheelchair     Assist Is the patient using a wheelchair?: Yes Type of Wheelchair: Manual    Wheelchair assist level: Dependent - Patient 0%      Wheelchair 50 feet with 2 turns activity    Assist        Assist Level: Dependent - Patient 0%   Wheelchair 150 feet activity     Assist      Assist Level: Dependent - Patient 0%   Blood pressure 131/68, pulse 66, temperature 97.8 F (36.6 C), resp. rate 18, height 5\' 4"  (1.626 m), weight 94.4 kg, SpO2 97%.  Medical Problem List and Plan: 1. Functional deficits secondary to metabolic encephalopathy due to sepsis secondary to catheter peritonitis and hypoxemia              -patient may not shower             -ELOS/Goals: 16-18 days, PT/OT Sup, SLP min A - DC goal 10/21   - Per wife, patient will need to be SPV/CGA level with rolling walker for return home.   - 10/11: Long discussion with wife, by 10/15 we will need to dispo plan home with [facility] and ER for HD vs. to SNF with HD capabilities.  Currently, no options for home or OP dialysis.  Continuing to work on improving behaviors so that he may qualify for outpatient dialysis.  Not considering hospice/palliative at this time.  -10/15: Team thinks CGA with RW may be realistic given improvements over the last week. Will get family training this week. Plan for DC 10/21; hopeful will return to [facility] but if not will use ER for dialysis in the  interim until they can establish. Offered SNF with Dialysis but wife prefers this plan.              -Continue CIR therapies including PT, OT, and SLP   -[facility] -- encephalopathy/aggitation has developed since approximately 8/22, with decline earlier following COVID infection per wife   -Therapy QD schedule  - Con't CIR PT, OT and SLP  - Patient was seen by palliative care, changed to DNR status Marcelino Duster with palliative continuing to follow  -Tried wife at bedside with dialysis on 10-3, patient  remained agitated and pulling at lines, per nephrology not a candidate for outpatient hemodialysis.     10/6 vascular is not recommending conversion to PD.   10-7: Palliative signed off  2.  Antithrombotics: -DVT/anticoagulation:  Pharmaceutical: Heparin, 03/23/23 changed to Lovenox -  consistent refusal -  started on Eliquis 2.5 mg BID - Dced 9/12 per cardiology "With patient's chronic anemia and agitation/recent falls, appears that risk of OAC>benefits at this time... ". SCDs at bedtime added 10/4.              -antiplatelet therapy: aspirin and Plavix --> ASA alone per cardiology 9/11   3. Pain Management:              -Lidoderm patches to back, Tylenol every 4 hours as needed's, muscle rub as needed, and oxycodone 2.5 mg 3 times daily as needed  -Elavil 25 mg at bedtime for nerve pain and sleep 8/27 -weaned off 9-7 due to oversedation  - No use of PRNs or general complaints of pain since 9-29  4.  Lethargy/ intermittent agitation /insomnia: Persistent despite interventions  -Weaned off of veil bed, remains on telesitter for safety.  Appears somewhat better with wife at bedside and when seated at the station.             -Admitted on Seroquel 50 mg TID -weaned off             -clonazepam 0.5 mg TID prn -transition to lorazepam 0.5 mg daily p.o. or IV as needed for anxiety, agitation -10-4 psychiatry premedicating dialysis with1 mg ativan -Currently have Atarax first-line and  Ativan 1 mg scheduled predialysis for agitation; redirect and avoid overstimulation as much as possible.   - Avoid increasing sedating medications as much as possible, as there is a direct effect on patient's sleep, confusion, and agitation up to a day after receiving PRN antipsychotics and Ativan.              - Dced Geodon 10 mg IM q 12 hours prn d/t  switched to Abilify per psychiatry 9-17             -8-28 added depakote 125 mg q8h for ongoing aggitation, mood stabilization -discontinued 9-2, resumed 9-21, increased 9-23 - level 18 subtherapeutic increased to 250 mg Q12h 10/4 -> psych changing to long-acting to 250 mg nightly 10-6; repeat levels 10-8 - <10  - 10-8: D/w psych adjusting vs. Dcing depakote; in agreement with plan, Depakote Dc'd 10/9 --> resumed 250 mg at bedtime per psych 10/14; report free levels likely much higher than plasma concentration and this dose may be adequite  - 9-17 Abilify started per psychiatry 5 mg -> 7.5 mg 9-27 -> 10 mg 10/11   - Will need OP labs, LFTs for monitorring  - 9-24 memantine 5 mg daily started per psychiatry, discontinued 10-3 per family request due to increasing confusion and lethargy -Psychiatry following, appreciate their assistance and recommendations in managing this patient - signed off 10/16 - Responsive to low stimulating environment, music, and redirection.  Remains intermittently agitated, especially with dialysis and at nighttime, but is significantly improving over the weeks.   10/8: Poor sleep overnight, remains pleasant and cooperative today and appreciate overall slight improvement in behaviors this week. 10/9: Neurology noting pre-existing cognitive deficits documented on the outpatient side; while these are significantly worse, may be beneficial to reach out to other family members regarding his prior cognitive baseline.  Will also look into family bringing in CPAP. 10/12:  CPAP ordered.  Psych reccommending possibly increasing abilify next  week if tolerated. D/t no PRN use in some time, DC at bedtime scheduled trazadone to limit psychoactive medication burden.   10/13: Poor sleep, resume trazadone 50 mg at bedtime.  As seen with prior administrations, believe predialysis Ativan is causing significant lethargy and confusion up to a day after administration 10/15 - 16: remains impulsive overnights but no PRNs needed.    5. Hypoactive delirium/metabolic encephalopathy/dementia with sundowning: This patient is not capable of making decisions on his own behalf.  Persistent despite significant efforts to workup/treat.  -  8/29: EEG negative, 8-30: MRI showing chronic microvascular changes, otherwise normal.  Multiple lab workups for encephalopathy with CMP, ammonia, CBC negative except for low VPA.   -  10-2: Lethargic, jerking , BG 70s, improved with POs.  Resume blood glucose check twice daily. Repeat VPA levels (18).  CMP CBC look okay.    -10/8: Neuro also to eval as above; appreciate their assistance and recommendations. -recommending discontinuation of Atarax and Ativan; will consider if he is able to tolerate transition to dialysis chair. Appreciate their assistance and recommendations.  Folate, B12, and TSH normal.   - 10/13: Will add 300 mg gabapentin to pre-dialysis, Ativan change to PRN 0.5 mg during dialysis only to target behaviors.   - 10/15: During dialysis [facility] improved behaviors with earlier time and allowed to sit in chair; continue this and hopeful for OP chair    6. Skin/Wound Care: Routine skin care checks             -sacral skin off-loading/protection  DC IV 10/16   7. Fluids/Electrolytes/Nutrition: Strict Is and Os and follow-up chemistries per nephrology             - renal diet/ 1200 cc FR  - Renvela 800 mg 3 times daily for hyperphosphatemia added per nephrology 8-27   - Management per nephrology, labs appear approximately stable, p.o. intakes are appropriate at this time but wax and wane with lethargy   -  10/2-10: Mild hyponatremia 134-133 recently; is not worsening, has dipped into this range before, no apparent cognitive effects. Continue management with dialysis. 10/10 - resolved    8: Hypotension/Hypertension: monitor TID and prn             -continue midodrine as needed during dialysis  9-1: add midodrine 5 mg tid and monitor.  DC Coreg as heart rate has been low to normal; may resume at low-dose once blood pressure improved.  9/13 DC midodrine   - Intermittent hypertension into the 150s, is higher on off dialysis days.  Would maintain higher perfusion pressure given microvascular changes seen on MRI.  No medication adjustments for now.  10-7 - 10/9 well-controlled, with intermittent transient elevations.  10/11: Add amlodipine 2.5 mg daily for consistent mild elevations--> normotensive 10/12-13  -10/14-16 BP controlled overall, continue current regimen    05/01/2023    7:30 PM 05/01/2023    5:59 PM 05/01/2023    5:48 PM  Vitals with BMI  Systolic 131 134 474  Diastolic 68 75 70  Pulse 66 61 67     9: Hyperlipidemia: continue statin   10: CAD s/p CABG 2023: on DAPT and statin.  - troponin elevated but stable, monitor 8/27   11: DM-2: A1c = 6.3% (at home on Lantus 22 units at bedtime, Tradjenta 5 mg daily)        -monitor PO intake             -  continue SSI (0-6 units) does not appear to be requiring coverage   - 9/6 DC SSI, change CBGs to daily   - 10-2: Hypoglycemic today, 70s, improved with p.o. intakes, 88 at recheck.  Likely due to poor p.o. intakes last few days.  Start CBG checks twice daily  - 10-4: Patient agitated with CBG checks, refusing.  Moved to as needed for lethargy, AM  - Intermittent mild elevations on BMP but not warranting Tx currently (<200)     12: ESRD: HD on Tues, Thurs, Sat -nephrology made aware  -Regularly have to hold dialysis or stopped early due to agitated behaviors, which is ongoing.  Recently has been able to receive all or part of most  dialysis sessions.  -Have tried premedicating patient and providing as needed medications with Haldol, Ativan as available on dialysis unit; measures helped temporarily but overall do not seem to affect treatment  -Wife at bedside for dialysis 10-3, did not improve behaviors, per nephrology patient is not a candidate for outpatient dialysis.     -10/5-6 see discussion above. Wife tries to help in HD. However, behaviors continue to make HD difficult.  -Unfortunately, vascular not offering conversion to PD.  Will discuss possible options with nephrology.    - 10/8:  Nephro to proved detailed notes on dialysis behaviors this week to target; goal to be OP HD candidate -agitated 10-10 and 10/12 session, continue to work on behaviors as above  10/15: trialling earlier dialysis session today - went very well! D/w nephology continuing earlier times to avoid aggitation (?sundowning) and allow full sessions. No PRN ativan or atarax needed with pre-gabapentin.   13: COPD/OSA; chronic home 2L O2 (Symbicort, Ventolin, prednisone 40 mg daily at home)             -continue symbicort  -Added home oxygen back 8-27  -Satting well on room air, oxygen not required   -10/11: Patient's wife reports home CPAP machine not usable; RT consult to resume nighttime CPAP   - not using   14: GERD: continue Protonix   15: Non-sustained VT/pAF/bradycardia: resolved. per cardiology, Dr. Algie Coffer: -continue carvedilol 6.25 mg BID; consider increasing to 12.5 mg BID before starting low dose amiodarone  -consider Eliquis and discontinue asa and Plavix in one week if no active GI bleeding and stable hemoglobin from 8/24 -hemoglobin has remained stable but very low around 7, no recurrent A-fib, monitor for now given agitation/fall 8-31 and consider initiating Eliquis when safe -Some intermittent recurrent bradycardia while at inpatient rehab, improved with discontinuation of Elavil and Geodon -9/11 EKG for bradycardia episode today,  2nd degree A-V block mobitz type 1.  Continue to hold beta blocker,9/11 will restart eliquis at 2.5 mg BID dose. Will consult cardiology regarding need for ASA/plavix and coreg.  Dr. Elease Hashimoto does not appear convinced that he has A-fib 9/12 per cardiology, DAPT transition to daily aspirin 81 only and Eliquis was discontinued - Heart rate currently well-controlled, hypertension as above, continuing to hold Eliquis due to falls/poor safety awareness, starting SCDs as above   16: HFrEF: Daily weights fluctuating significantly d/t dialysis. Monitor for signs of overload. No current concerns. Weights / volume stable with dialysis.    17: Liver nodularity on CT scan: LFTs reported to be normal. Avoid hepatotoxic medications    -Abdominal ultrasound earlier this admission with minimal ascites  -Ammonia levels have remained normal on multiple checks   18: Epistaxis - resolved             -  previously improved with flonase and saline spray   19: Morbid Obesity. Dietary counseling Body mass index is 35.72 kg/m.    20. Hematuria - resolved  9/22- required in/out to check U/A- having new hematuria- nursing to keep an eye- since also bleeding from penis a little- monitor   9/23 no wBCs  on UA, follow  urine culture-Staphylococcus hemolyticus-discussed with pharmacy and ID pharmacy.  Think this is more likely contaminant  9/25 discuss with nephrology about possible treatment or other evaluation options  Spoke with Dr. Ronalee Belts, most likely contaminant.  Recommended treating only if symptomatic.  If hematuria continues consider urology input  9/27 urine has improved to clear yellow   21.  Vitamin C deficiency.  Aggressive repletion per psychiatry, 100 mg IV x 6+ 500 mg p.o. daily.  Feel this may help his cognition.  -04-22-09: Am noting some slightly improved attention, memory on gross exam since IV course; continue PO supplementation  23.  Chest pain a.m. 10-3.  EKG unchanged, self resolved.  -control bp  as possible  24. Bilateral ankle pain. Changing locations, no known trauma, no instability, edema, or point tenderness. ?Peripheral neuropathy related; may benefit from Qutenza but would not add sedating medications for tx at this time.   -Resolved  LOS: 51 days A FACE TO FACE EVALUATION WAS PERFORMED  Angelina Sheriff 05/02/2023, 8:34 AM

## 2023-05-02 NOTE — Progress Notes (Signed)
Speech Language Pathology Weekly Progress and Session Note  Patient Details  Name: [name] STEPANSKI MRN: [id number] Date of Birth: 1950/12/18  Beginning of progress report period: April 25, 2023 End of progress report period: May 02, 2023  Today's Date: 05/02/2023 SLP Individual Time: 3875-6433 SLP Individual Time Calculation (min): 45 min  Short Term Goals: Week 7: SLP Short Term Goal 1 (Week 7): Patient will utilize external aids to orient to time and situation given modA SLP Short Term Goal 1 - Progress (Week 7): Met SLP Short Term Goal 2 (Week 7): Pt will complete functional problem solving with 60% acc with max A. SLP Short Term Goal 2 - Progress (Week 7): Not met SLP Short Term Goal 3 (Week 7): Pt will sustain attention to functional task for 10 minutes given mod multimodal cues. SLP Short Term Goal 3 - Progress (Week 7): Not met SLP Short Term Goal 4 (Week 7): Pt will answer biographical questions with 90% accuracy with mod A multimodal cues. SLP Short Term Goal 4 - Progress (Week 7): Met    New Short Term Goals: Week 8: SLP Short Term Goal 1 (Week 8): Pt will orient to time and  situation given external aids with min A SLP Short Term Goal 2 (Week 8): Pt will complete functional problem solving with 60% acc with max A SLP Short Term Goal 3 (Week 8): Patient will sustain attention to functional task for 10 minutes given min multimodal cues SLP Short Term Goal 4 (Week 8): Pt will answer biographical and personal questions with 90% accuracy with min multimodal cues  Weekly Progress Updates: Pt made some gains and has met two of four STG's this reporting period due to improved orientation with use of external aids and verbalization of biographical information. Pt continued to require max A for sustaining attention and for functional problem solving. Pt/ family education ongoing. Pt would benefit from continued skilled SLP intervention to maximize cognition linguistic function  prior to discharge.   Intensity: Minumum of 1-2 x/day, 30 to 90 minutes Frequency: 1 to 3 out of 7 days (QD) Duration/Length of Stay: TBD d/t possible SNF placement Treatment/Interventions: Cognitive remediation/compensation;Environmental controls;Cueing hierarchy;Functional tasks;Therapeutic Activities;Internal/external aids;Patient/family education   Daily Session  Skilled Therapeutic Interventions: Patient was seen in am to address cognitive re- training. Pt was alert and seated upright in WC upon SLP arrival. Upon SLP arrival, SLP asked pt to mute television with pt adequately using call button with Sup A. He recalled seeing this clinician on previous date.  SLP further challenged pt in orientation to temporal concepts. Pt identified concepts given external aid with mod A initially with cues able to be faded to sup to min A across remainder of session. Given review of information pt recalled dialysis schedule for duration of session with sup A for use of external aid. SLP engaged pt in discussion of safety precautions. Pt with continued poor awareness of deficits as indicated by stating it would be safe for him to ambulate indep. However, pt able to verbalize clinician recommended task of using call button and waiting for assist in 3 separate opportunities. Given additional scenarios, pt required mod to max A for identifying appropriate solution with 50% acc. Patient was left seated upright in recliner with call button within reach and chair alarm active. SLP to continue POC.    Pain Pain Assessment Pain Scale: 0-10 Pain Score: 5  Pain Type: Acute pain Pain Location: Generalized  Therapy/Group: Individual Therapy  Renaee Munda 05/02/2023,  11:18 AM

## 2023-05-02 NOTE — Progress Notes (Addendum)
[facility] Progress Note   Subjective:  Seen in room - calm, working with PT. Did MUCH better with HD yesterday, nearly full HD, 3.3L off and was calm, sat in recliner. Have discussed plan with CIR team - will aim for 1st shift HD, sitting in recliner - this is very importance. He has severe sundowning due to dementia, but is quite calm/agreeable during the day. Denies CP/dyspnea.  Objective Vitals:   05/01/23 1759 05/01/23 1930 05/01/23 2005 05/02/23 0731  BP: 134/75 131/68    Pulse: 61 66    Resp: 18 18    Temp:  97.8 F (36.6 C)    TempSrc:      SpO2: 99% 96% 96% 97%  Weight:      Height:       Physical Exam General: Well appearing man, confused. NAD,  Heart: RRR; no murmur Lungs: CTAB; no rales Abdomen: soft Extremities: trace BLE edema Dialysis Access: LUE AVF + bruit  Additional Objective Labs: Basic Metabolic Panel: Recent Labs  Lab 04/26/23 1330 04/28/23 1037  NA 137 138  K 4.2 4.3  CL 96* 97*  CO2 24 24  GLUCOSE 103* 137*  BUN 49* 68*  CREATININE 10.77* 11.50*  CALCIUM 9.1 9.2  PHOS 4.4 4.6   Liver Function Tests: Recent Labs  Lab 04/26/23 1330 04/28/23 1037  ALBUMIN 2.5* 2.5*   CBC: Recent Labs  Lab 04/26/23 1330 04/28/23 1037 05/01/23 1322  WBC 4.4 4.1 3.8*  NEUTROABS  --  2.8  --   HGB 10.5* 10.4* 9.5*  HCT 34.4* 33.8* 30.7*  MCV 90.8 88.0 90.0  PLT 170 167 137*   Medications:  sodium chloride 10 mL/hr at 04/18/23 1515    (feeding supplement) PROSource Plus  30 mL Oral BID BM   amLODipine  2.5 mg Oral Daily   ARIPiprazole  10 mg Oral QHS   ascorbic acid  500 mg Oral Daily   aspirin EC  81 mg Oral Daily   atorvastatin  80 mg Oral Daily   Chlorhexidine Gluconate Cloth  6 each Topical Q0600   darbepoetin (ARANESP) injection - DIALYSIS  150 mcg Subcutaneous Q Sun-1800   divalproex  250 mg Oral QHS   feeding supplement  237 mL Oral BID WC   gabapentin  300 mg Oral Q T,Th,Sa-HD   Gerhardt's butt cream  1 Application  Topical QID   lidocaine  1 patch Transdermal Q2200   lidocaine  1 patch Transdermal Q24H   mometasone-formoterol  2 puff Inhalation BID   pantoprazole  40 mg Oral BID   traZODone  50 mg Oral QHS    Dialysis Orders: Previously on [facility] thru [facility] - changed to HD - will need outpatient HD unit established prior to d/c. 4hr, 3K, EDW ~94, L AVF    Assessment/Plan:   Debility - in CIR. Appropriateness for CIR nearing its end per NP.   Encephalopathy/agitation: Waxing/waning agitation, dementia, has significant sundowning. No acute findings on imaging. Neuro and Psych consulting to help improve behavior. Requesting better documentation on behavior with HD. He didn't do well with HD on 10/9 and 10/12. Did not improve with wife at bedside previously, previously requiring one on one HD for his safety, which is not possible in outpatient setting.  Use of IV Ativan can not be done in OP HD units.  IR will not place catheter, agitation is not an indication to place catheter.  Considered switching to PD so patient can complete dialysis at home, but  vascular surgery felt he was high risk for PD catheter placement. Meds being adjusted per primary team/psychiatry team. See below -- doing [employer] better now. ESRD: Transitioned from PD to HD - TTS schedule. Using old AVF which is still functional. TRYING TO DIALYZE AS EARLY AS POSSIBLE IN DAY IN RECLINER - did tremendously better on 10/15. Hypertension/volume: BP ok - off midodrine, now on low dose amlodipine. + edema. Anemia: Continue Aranesp weekly. Last Tsat 38% with ferritin 2175.  No iron. Hgb ok. Metabolic bone disease: CorrCa and phos in goal. No VDRA.  Nutrition:  Alb low, continue supps.  Renal diet w/fluid restrictions.  Hx NSVT: no meds at this time. Hx culture negative peritonitis and associated fungemia + enterococcus bacteremia - underwent PD cath removal, TDC placement and removal, temp HD cath placement  and removal, and line  holiday. Completed IV unasyn, IV micafungin and cipro on 8/15. TDC not replaced cause he had functional AVF Dispo - ongoing issues with safety during HD making patient unable to discharge. Will need consistent safe behavior during HD to be candidate for outpatient.  He is not a candidate for LTAC per SW. Unfortunately our options are limited to essentially hospice or waiting to see if mental status improves. His wife does not want him to go to hospice. Looking at possible out of state facility if behavior not improved. SEE ABOVE, MUCH BETTER ON 10/15. Will try to CLIP back to prior HD unit.  Ozzie Hoyle, PA-C 05/02/2023, 9:43 AM  [employer]

## 2023-05-02 NOTE — Progress Notes (Addendum)
Patient ID: [name] [name], male   DOB: 02/07/1951, 72 y.o.   MRN: [id number]  SW spoke with pt wife [name] [name] to discuss previous conversation with attending on decision made to take pt home with dialysis through ED OR to pursue a SNF out of state and dialysis. Pt wife has opted to take patient home. She is aware SW will share her concerns about how to coordinate dialysis through ED. Wife reports pt has 3in1 BSC, and shower seat since their bathroom is small and TTB.  Wife reports w/c will not fit in their home, when discussing for community needs-- amenable to RW and w/c. No HHA preference.   SW shared discharge plan with medical team, including nephrology team. A referral was made to home dialysis clinic-[facility] and waiting on updates on if able to accept.   SW followed up with pt wife to discuss above about a referral being made to home dialysis location, and will follow-up with updates. Discussion about DME RW and w/c. Wife will bring in RW and get feedback from therapy team on if needed. States with w/c, she repots they do not go out often and there is a w/c at the doctor's office.SW will followup with updates.   SW sent HHPT/OT/SLP/aide referral to [facility] and waiting on follow-up.  *referral accepted.   Cecile Sheerer, MSW, LCSWA Office: [phone] Cell: [phone] Fax: [phone]

## 2023-05-02 NOTE — Consult Note (Addendum)
[name] Consult Evaluation  Service Date: May 02, 2023 LOS:  LOS: 51 days    Primary Psychiatric Diagnoses  Delirium/Dementia 2.   Acute metabolic encephalopathy 3.   Vit C Deficiency    Assessment  [name] [name] is a 72 y.o. male admitted medically to the hospitalists service on 02/15/23 for confusion and SOB in the context of a recent COVID infection and admitted to acute care on 03/12/2023  4:25 PM for acute metabolic encephalopathy assumed due to recent COVID infection.  He has no past psychiatric diagnosis and has a past medical history of COPD, IDT2DM, GERD, and ESRD. Psychiatry was consulted for medication adjustment - lethargy, agitation, delirium Dr. Gasper Sells aware  by Dr. Shearon Stalls.  The patient's presentation remains most consistent with hypoactive delirium, given relatively acute onset, visual hallucinations, and agitation worse at night. Notably at this point (chronicity), incident dementia also high on differential - both periods of waxing and waning are significantly below premorbid baseline. Most likely due to multiple etiologies including but not limited to infection (recent COVID-19 infection), medications, pain, altered sleep/wake cycle, and limited mobility. Appreciate PT/OT/ST efforts in increasing pt activity during the day. He is fluctuating around a lower baseline than prior to this hospitalization.   Patient has had no reported adverse affects to Abilify, which was started the last time psychiatry was involved. Generally he has had improvement in alertness without episodes of agitation since this is started, although continues to have agitation intermittently during dialysis. This time in discussion with Dr. Shearon Stalls and review of records, he was on the precipice of hospice if he was not able to remain calm during dialysis. Psychiatric interventions over past couple of weeks have been threefold - PRNs before dialysis, repletion of vitamin C (which I think  is largely responsible for improvement in alertness, and streamlining of overall medication regimen (stopped depakte d/t undetectable levels). Since around 10/8, he has had more overnight events (one prompted by overnight evaluation) and also been having outsized reactions to pre-dialysis ativan. On 10/14 evaluation similar to several prev evals (pleasant but confused); wife at bedside assented to depakote. By 10/16  he had had improving overnight delirium and markedly good dialysis session on 10/15. Due to improvement, we are signing off; please reconsult with any issues. D/w primary team.    Minimization of deliriogenic insults will continue to be of utmost importance. This includes promoting the normal circadian cycle, minimizing lines/tubes, avoiding deliriogenic medications such as benzodiazepines and anticholinergic medications, and frequently reorienting the patient. Symptomatic treatment for agitation can be provided by antipsychotic medications, though it is important to remember that these do not treat the underlying etiology of delirium. Notably, there can be a time lag effect between treatment of a medical problem and resolution of delirium. This time lag effect may be of longer duration in the elderly, and those with underlying cognitive impairment or brain injury.   Diagnoses:  Active Hospital problems: Principal Problem:   Acute metabolic encephalopathy Active Problems:   Type 2 diabetes mellitus with chronic kidney disease on chronic dialysis, with long-term current use of insulin (HCC)   HFrEF (heart failure with reduced ejection fraction) (HCC)   Morbid obesity (HCC)   Encephalopathy   Epistaxis   PAF (paroxysmal atrial fibrillation) (HCC)   Long term (current) use of insulin (HCC)   ESRD on dialysis (HCC)   Debility   Agitation   Hematuria     Plan  ## Psychiatric Medication Recommendations:  --  Abilify inc to 10 mg on F 10/11 , PO, daily at bedtime(agitation -  effective) -- restart depakote 250 mg at bedtime 10/14 - taper unsuccessful.   - monitor LFTs and ammonia intermittently  - likely no indication for levels - hypoammonemic, clinically responding, will not get toxic on this dose.   - free levels higher than serum levels imply d/t hypoalbuminemia.  -- Hydroxyzine  PRN before dialysis (itching) -- continue lorazepam 1 mg prior to dialysis on Tuesday, Thursday, and Saturday (note if there is a change in dialysis schedule, please adjust lorazepam dose to only be administered prior to dialysis) -- discontinue as needed lorazepam, as this may be disinhibiting.  -- Continue Vit C - oral OK now that IV course over.   ## Medical Decision Making Capacity:  -- Clearly lacks for most decisions; wife is listed as HCPOA. Haven't evaluated a specific decisoin.   ## Further Work-up:  -- Most recent EKG on 04/19/23 had QtC of 432 --Depakote level to be drawn on 04/24/2023  -- Put in lipid profile and A1c as very likely to dc on abilify d/w primary   -- Pertinent labwork reviewed earlier this admission includes: v low vit C    reasonably extensive work-up largely wnl with the exception of labs associated with ESRD including low eGFR, high BUN, and high albumin, all of which are associated with an increased risk of dementia, delirium, and acute metabolic encephalopathy.  ## Disposition:  -- Patient's wife prefers that patient be discharged to a rehab facility; if not possible she prefers he be discharged home and to transport patient to a dialysis center as needed. No psychiatric contraindications to this plan - defer to PT, OT, and medical assessment. Patient's wife states that a family member can accompany him to dialysis center to help reduce the risk and complications of agitation. Patient has responded well to Abilify and has had no reported significant agitation over the past 48 hours. -- Recommend a TOC consult  ## Behavioral / Environmental:  --   Delirium Precautions: Delirium Interventions for Nursing and Staff: - RN to open blinds every AM. - To Bedside: Glasses and pt's own shoes. Make available to patient when possible and encourage use. - Encourage po fluids when appropriate, keep fluids within reach. - OOB to chair with meals. - Passive ROM exercises to all extremities with AM & PM care. - RN to assess orientation to person, time and place QAM and PRN. - Recommend extended visitation hours with familiar family/friends as feasible. - Staff to minimize disturbances at night. Turn off television when pt asleep or when not in use.  - When patient is awake and watching TV, his wife reports that he likes watching western movies, racing, boxing, or [place]  ## Safety and Observation Level:  - Based on my clinical evaluation, I estimate the patient to be at low risk of self harm in the current setting - At this time, we recommend a routine level of observation. This decision is based on my review of the chart including patient's history and current presentation, interview of the patient, mental status examination, and consideration of suicide risk including evaluating suicidal ideation, plan, intent, suicidal or self-harm behaviors, risk factors, and protective factors. This judgment is based on our ability to directly address suicide risk, implement suicide prevention strategies and develop a safety plan while the patient is in the clinical setting. Please contact our team if there is a concern that risk level has changed.  Suicide risk assessment: Patient has following modifiable risk factors for suicide: access to guns, which we are addressing by the patient's wife planning to lock up the guns prior to the patient returning home.   Patient has following non-modifiable or demographic risk factors for suicide: male gender and psychiatric hospitalization  Patient has the following protective factors against suicide: Supportive family,  Supportive friends, and no history of suicide attempts  Thank you for this consult request. Recommendations have been communicated to the primary team.  We will continue to follow at this time.   Kristeen Lantz A Jamilyn Pigeon  Psychiatric and Social History   Relevant Aspects of Hospital Course:  Admitted on 02/15/23 for confusion and SOB in the context of a recent COVID infection. He was found to have sepsis secondary to peritoneal dialysis associated catheter peritonitis and polymicrobial bacteremia/fungemia. Broad spectrum antibiotics started. Hospital course complicated by worsening encephalopathy with hypoxemia requiring transfer to the ICU.   Patient Report:  Pt was awake, alert, and oriented at least superficially to situation. Attenttion remains quite poor but continues to attempt tasks - did a little better counting from 15 to 8 (made 2-3 errors and counted down to 4 before asking where he was supposed to stop). Remembered what TV show he was watching after eval, which is improvement. Has no memory of PT/'OT session this AM. Thinks Biden is running against the redhead in the presidential evaluation - this is an improvement.   On PE today 10/15 has improved and very minimal cogwheeling in b/l u/e.   Psych ROS:  Today denied SI, HI, AH/VH both historic and current. Not able to do detailed sx inventory d/t dementia.   Depression: In hospital, patient displays signs of sleep disturbance, fatigue, diminished concentration and cognition, and psychomotor changes. 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  Anxiety: Patient did not express or display concerns for anxiety, but was somnolent throughout interview Mania (lifetime and current): No reported symptoms of mania  Psychosis: (lifetime and current): Patient's wife reports that over the course of his  hospital stay, at times the patient had hallucinations about deceased or living family members who are not currently in the room with him. None reported in the past 48 hours.  Collateral information:  10/8: Wife has seen a significant improvement in the past week - much more alert, able to listen, having better conversations. She is agreeable to dc depakote. Went over his current psychotropic medications. Informed her we will likely sign off if he does well after dc of Depakote.    Psychiatric History:  Information collected from patient's wife and EMR  Prev Psych Dx/Sx: None Current Psych Provider: None Home Meds (current): None Previous Psych Med Trials: No past history Therapy: No past history  Prior ECT: Wife denies Prior Psych Hospitalization: Wife denies Prior Self Harm: Wife denies Prior Violence: Wife denies  Family Psych History: Wife states that there is no known psych history in the patient's family Family Hx suicide: Wife denies  Social History:  Developmental Hx: None on record Educational Hx: Deferred Occupational Hx: Patient worked as a Naval architect for 32 years Legal Hx: Wife states patient currently has no legal issues Living Situation: Lives with wife at home Spiritual Hx: Patient normally attends church every Sunday and participates in Sunday school Access to weapons: Wife states that there are currently  guns in the home, but she plans to have them locked up prior to the patient returning home  Substance History Tobacco use: Wife states patient does not use tobacco Alcohol use: Wife states patient does not use alcohol Drug use: Wife states patient does not use drugs   Exam Findings   Psychiatric Specialty Exam: Patient is oriented to self and place; superficially  to situation. Attention and concentration are poor, likely new baseline; a little better than earlier this week.  No psychomotor agitation/retardation. Insight is fair; judgment is fair to poor.  He denies SI, HI, or AVH. He is cooperative throughout the interview and displays no signs of agitation.  Physical Exam: Vital signs:  Temp:  [97.8 F (36.6 C)-98.2 F (36.8 C)] 98.1 F (36.7 C) (10/16 1435) Pulse Rate:  [61-75] 75 (10/16 1435) Resp:  [18-22] 20 (10/16 1435) BP: (119-134)/(68-80) 119/69 (10/16 1435) SpO2:  [92 %-100 %] 92 % (10/16 1435)  Physical Exam Constitutional:      Appearance: Normal appearance. He is obese.  HENT:     Head: Normocephalic and atraumatic.  Eyes:     Conjunctiva/sclera: Conjunctivae normal.  Cardiovascular:     Rate and Rhythm: Normal rate.  Pulmonary:     Effort: Pulmonary effort is normal. No respiratory distress.  Neurological:     General: No focal deficit present.    Blood pressure 119/69, pulse 75, temperature 98.1 F (36.7 C), resp. rate 20, height 5\' 4"  (1.626 m), weight 94.4 kg, SpO2 92%. Body mass index is 35.72 kg/m.   Other History   These have been pulled in through the EMR, reviewed, and updated if appropriate.   Family History:  The patient's family history includes Arthritis in an other family member; Asthma in an other family member; Cancer in his mother; Diabetes in his father, mother, and sister; Hypertension in his father, mother, and sister; Lung disease in an other family member.  Medical History: Past Medical History:  Diagnosis Date   Anemia    Arthritis    Asthma    Bell palsy    CAD (coronary artery disease)    a. 2014 MV: abnl w/ infap ischemia; b. 03/2013 Cath: aneurysmal bleb in the LAD w/ otw nonobs dzs-->Med Rx.   Chronic back pain    Chronic knee pain    a. 09/2015 s/p R TKA.   Chronic pain    Chronic shoulder pain    Chronic sinusitis    COPD (chronic obstructive pulmonary disease) (HCC)    Diabetes mellitus without complication (HCC)    type II    ESRD on peritoneal dialysis (HCC)    on peritoneal dialysis, [facility]   Essential hypertension    GERD (gastroesophageal reflux  disease)    Gout    Gout    Hepatomegaly    noted on noncontrast CT 2015   History of hiatal hernia    Hyperlipidemia    Lateral meniscus tear    Obesity    Truncal   Obstructive sleep apnea    does not use cpap    On home oxygen therapy    uses 2l when is going somewhere per patient    PUD (peptic ulcer disease)    remote, reports f/u EGD about 8 years ago unremarkable    Reactive airway disease    related to exposure to chemical during 9/11   Sinusitis    Vitamin D deficiency     Surgical History: Past Surgical History:  Procedure Laterality Date  A/V FISTULAGRAM N/A 02/28/2023   Procedure: A/V Fistulagram;  Surgeon: Victorino Sparrow, MD;  Location: [facility] INVASIVE CV LAB;  Service: Cardiovascular;  Laterality: N/A;   ASAD LT SHOULDER  12/15/2008   left shoulder   AV FISTULA PLACEMENT Left 08/09/2016   Procedure: BRACHIOCEPHALIC ARTERIOVENOUS (AV) FISTULA CREATION LEFT ARM;  Surgeon: Sherren Kerns, MD;  Location: [facility] OR;  Service: Vascular;  Laterality: Left;   CAPD INSERTION N/A 10/07/2018   Procedure: LAPAROSCOPIC PERITONEAL CATHETER PLACEMENT;  Surgeon: Rodman Pickle, MD;  Location: [facility] ORS;  Service: General;  Laterality: N/A;   CATARACT EXTRACTION W/PHACO Left 03/28/2016   Procedure: CATARACT EXTRACTION PHACO AND INTRAOCULAR LENS PLACEMENT LEFT EYE;  Surgeon: Jethro Bolus, MD;  Location: [facility] ORS;  Service: Ophthalmology;  Laterality: Left;  CDE: 4.77   CATARACT EXTRACTION W/PHACO Right 04/11/2016   Procedure: CATARACT EXTRACTION PHACO AND INTRAOCULAR LENS PLACEMENT RIGHT EYE; CDE:  4.74;  Surgeon: Jethro Bolus, MD;  Location: [facility] ORS;  Service: Ophthalmology;  Laterality: Right;   COLONOSCOPY  10/15/2008   Fields: Rectal polyp obliterated, not retrieved, hemorrhoids, single ascending colon diverticulum near the CV. Next colonoscopy April 2020   COLONOSCOPY N/A 12/25/2014   SLF: 1. Colorectal polyps (2) removed 2. Small internal hemorrhoids 3. the left colon is  severely redundant. hyperplastic polyps   CORONARY ARTERY BYPASS GRAFT N/A 06/05/2022   Procedure: OFF PUMP CORONARY ARTERY BYPASS GRAFTING (CABG) X 2 BYPASSES USING LEFT INTERNAL MAMMARY ARTERY AND RIGHT LEG GREATER SAPHENOUS VEIN HARVESTED ENDOSCOPICALLY;  Surgeon: Corliss Skains, MD;  Location: [facility] OR;  Service: Open Heart Surgery;  Laterality: N/A;   CORONARY STENT INTERVENTION N/A 07/25/2021   Procedure: CORONARY STENT INTERVENTION;  Surgeon: Tonny Bollman, MD;  Location: [facility] INVASIVE CV LAB;  Service: Cardiovascular;  Laterality: N/A;   CORONARY STENT INTERVENTION N/A 12/26/2021   Procedure: CORONARY STENT INTERVENTION;  Surgeon: Yvonne Kendall, MD;  Location: [facility] INVASIVE CV LAB;  Service: Cardiovascular;  Laterality: N/A;   CORONARY STENT INTERVENTION N/A 01/20/2022   Procedure: CORONARY STENT INTERVENTION;  Surgeon: Tonny Bollman, MD;  Location: [facility] INVASIVE CV LAB;  Service: Cardiovascular;  Laterality: N/A;   DOPPLER ECHOCARDIOGRAPHY     ESOPHAGOGASTRODUODENOSCOPY N/A 12/25/2014   SLF: 1. Anemia most likely due to CRI, gastritis, gastric polyps 2. Moderate non-erosive gastriits and mild duodenitis.  3.TWo large gstric polyps removed.    EYE SURGERY  12/22/2010   tear duct probing-[place]   FOREIGN BODY REMOVAL  03/29/2011   Procedure: REMOVAL FOREIGN BODY EXTREMITY;  Surgeon: Fuller Canada, MD;  Location: [facility] ORS;  Service: Orthopedics;  Laterality: Right;  Removal Foreign Body Right Thumb   IR FLUORO GUIDE CV LINE RIGHT  08/06/2018   IR FLUORO GUIDE CV LINE RIGHT  02/17/2023   IR US GUIDE VASC ACCESS RIGHT  08/06/2018   IR US GUIDE VASC ACCESS RIGHT  02/17/2023   KNEE ARTHROSCOPY  10/16/2007   left   KNEE ARTHROSCOPY WITH LATERAL MENISECTOMY Right 10/14/2015   Procedure: LEFT KNEE ARTHROSCOPY WITH PARTIAL LATERAL MENISECTOMY;  Surgeon: Vickki Hearing, MD;  Location: [facility] ORS;  Service: Orthopedics;  Laterality: Right;   LEFT HEART CATH AND CORONARY ANGIOGRAPHY N/A 07/25/2021    Procedure: LEFT HEART CATH AND CORONARY ANGIOGRAPHY;  Surgeon: Tonny Bollman, MD;  Location: [facility] INVASIVE CV LAB;  Service: Cardiovascular;  Laterality: N/A;   LEFT HEART CATH AND CORONARY ANGIOGRAPHY N/A 12/26/2021   Procedure: LEFT HEART CATH AND CORONARY ANGIOGRAPHY;  Surgeon: Yvonne Kendall, MD;  Location: [facility]  INVASIVE CV LAB;  Service: Cardiovascular;  Laterality: N/A;   LEFT HEART CATH AND CORONARY ANGIOGRAPHY N/A 01/20/2022   Procedure: LEFT HEART CATH AND CORONARY ANGIOGRAPHY;  Surgeon: Tonny Bollman, MD;  Location: [facility] INVASIVE CV LAB;  Service: Cardiovascular;  Laterality: N/A;   LEFT HEART CATHETERIZATION WITH CORONARY ANGIOGRAM N/A 03/28/2013   Procedure: LEFT HEART CATHETERIZATION WITH CORONARY ANGIOGRAM;  Surgeon: Marykay Lex, MD;  Location: [facility] CATH LAB;  Service: Cardiovascular;  Laterality: N/A;   NM MYOVIEW LTD     PENILE PROSTHESIS IMPLANT N/A 08/16/2015   Procedure: PENILE PROTHESIS INFLATABLE, three piece, Excisional biopsy of Penile ulcer, Penile molding;  Surgeon: Jethro Bolus, MD;  Location: [facility] ORS;  Service: Urology;  Laterality: N/A;   PENILE PROSTHESIS IMPLANT N/A 12/24/2017   Procedure: REMOVAL AND  REPLACEMENT  COLOPLAST PENILE PROSTHESIS;  Surgeon: Crista Elliot, MD;  Location: [facility] ORS;  Service: Urology;  Laterality: N/A;   PERIPHERAL VASCULAR BALLOON ANGIOPLASTY  02/28/2023   Procedure: PERIPHERAL VASCULAR BALLOON ANGIOPLASTY;  Surgeon: Victorino Sparrow, MD;  Location: [facility] INVASIVE CV LAB;  Service: Cardiovascular;;   PORT-A-CATH REMOVAL N/A 02/19/2023   Procedure: REMOVAL OF PERITONEAL DIALYSIS CATHETER;  Surgeon: Victorino Sparrow, MD;  Location: [facility] OR;  Service: Vascular;  Laterality: N/A;   QUADRICEPS TENDON REPAIR  07/21/2011   Procedure: REPAIR QUADRICEP TENDON;  Surgeon: Fuller Canada, MD;  Location: [facility] ORS;  Service: Orthopedics;  Laterality: Right;   RIGHT/LEFT HEART CATH AND CORONARY ANGIOGRAPHY N/A 05/30/2022   Procedure: RIGHT/LEFT HEART CATH  AND CORONARY ANGIOGRAPHY;  Surgeon: Corky Crafts, MD;  Location: [facility] INVASIVE CV LAB;  Service: Cardiovascular;  Laterality: N/A;   TEE WITHOUT CARDIOVERSION N/A 06/05/2022   Procedure: TRANSESOPHAGEAL ECHOCARDIOGRAM (TEE);  Surgeon: Corliss Skains, MD;  Location: [facility] OR;  Service: Open Heart Surgery;  Laterality: N/A;   TOENAIL EXCISION     removed x2-bilateral   UMBILICAL HERNIA REPAIR  07/17/2005   [place]    Medications:   Current Facility-Administered Medications:    (feeding supplement) PROSource Plus liquid 30 mL, 30 mL, Oral, BID BM, Julien Nordmann, PA-C, 30 mL at 04/25/23 0953   acetaminophen (TYLENOL) tablet 325-650 mg, 325-650 mg, Oral, Q4H PRN, Milinda Antis, PA-C, 650 mg at 04/12/23 2036   amLODipine (NORVASC) tablet 2.5 mg, 2.5 mg, Oral, Daily, Elijah Birk C, DO, 2.5 mg at 05/02/23 4098   ARIPiprazole (ABILIFY) tablet 10 mg, 10 mg, Oral, QHS, Massengill, Harrold Donath, MD, 10 mg at 05/01/23 2127   ascorbic acid (VITAMIN C) tablet 500 mg, 500 mg, Oral, Daily, Massengill, Nathan, MD, 500 mg at 05/02/23 1191   aspirin EC tablet 81 mg, 81 mg, Oral, Daily, Milinda Antis, PA-C, 81 mg at 05/02/23 0835   atorvastatin (LIPITOR) tablet 80 mg, 80 mg, Oral, Daily, Milinda Antis, PA-C, 80 mg at 05/02/23 4782   bisacodyl (DULCOLAX) EC tablet 5 mg, 5 mg, Oral, Daily PRN, Valetta Fuller, Lynnell Jude, PA-C   Chlorhexidine Gluconate Cloth 2 % PADS 6 each, 6 each, Topical, Q0600, Penninger, Lillia Abed, PA   Darbepoetin Alfa (ARANESP) injection 150 mcg, 150 mcg, Subcutaneous, Q Sun-1800, Paytes, Austin A, RPH, 150 mcg at 04/29/23 1746   divalproex (DEPAKOTE) DR tablet 250 mg, 250 mg, Oral, QHS, Huong Luthi A, 250 mg at 05/01/23 2127   feeding supplement (ENSURE ENLIVE / ENSURE PLUS) liquid 237 mL, 237 mL, Oral, BID WC, Setzer, Lynnell Jude, PA-C, 237 mL at 04/23/23 0755   gabapentin (NEURONTIN) capsule 300 mg,  300 mg, Oral, Q T,Th,Sa-HD, Elijah Birk C, DO, 300 mg at 05/01/23 0945    Gerhardt's butt cream 1 Application, 1 Application, Topical, QID, Setzer, Lynnell Jude, PA-C, 1 Application at 05/01/23 2000   guaiFENesin-dextromethorphan (ROBITUSSIN DM) 100-10 MG/5ML syrup 10 mL, 10 mL, Oral, Q6H PRN, Milinda Antis, PA-C, 10 mL at 04/03/23 1633   hydrOXYzine (ATARAX) tablet 10 mg, 10 mg, Oral, TID PRN, Elijah Birk C, DO, 10 mg at 04/28/23 2029   lidocaine (LIDODERM) 5 % 1 patch, 1 patch, Transdermal, Q2200, Setzer, Lynnell Jude, PA-C, 1 patch at 04/12/23 2035   lidocaine (LIDODERM) 5 % 1 patch, 1 patch, Transdermal, Q24H, Skip Mayer A, MD, 1 patch at 04/15/23 2000   LORazepam (ATIVAN) tablet 0.5 mg, 0.5 mg, Oral, Daily PRN, Angelina Sheriff, DO   mometasone-formoterol (DULERA) 200-5 MCG/ACT inhaler 2 puff, 2 puff, Inhalation, BID, Setzer, Lynnell Jude, PA-C, 2 puff at 05/02/23 0731   Muscle Rub CREA, , Topical, PRN, Fanny Dance, MD   ondansetron [facility]) tablet 4 mg, 4 mg, Oral, Q6H PRN, 4 mg at 03/16/23 2154 **OR** ondansetron (ZOFRAN) injection 4 mg, 4 mg, Intravenous, Q6H PRN, Setzer, Lynnell Jude, PA-C, 4 mg at 04/03/23 1603   oxyCODONE (Oxy IR/ROXICODONE) immediate release tablet 2.5 mg, 2.5 mg, Oral, TID PRN, Elijah Birk C, DO, 2.5 mg at 04/28/23 2000   pantoprazole (PROTONIX) EC tablet 40 mg, 40 mg, Oral, BID, Setzer, Lynnell Jude, PA-C, 40 mg at 05/02/23 0836   traZODone (DESYREL) tablet 50 mg, 50 mg, Oral, QHS, Angelina Sheriff, DO, 50 mg at 05/01/23 2127  Allergies: Allergies  Allergen Reactions   Opana [Oxymorphone Hcl] Itching   Tramadol Itching

## 2023-05-02 NOTE — Progress Notes (Signed)
Pt slept well this shift from 2300-0500 with intermittent confusion and agitation but redirectable. Pt alert able to follow commands this am with decreased behavioral issues noted.

## 2023-05-03 LAB — RENAL FUNCTION PANEL
Albumin: 2.4 g/dL — ABNORMAL LOW (ref 3.5–5.0)
Anion gap: 18 — ABNORMAL HIGH (ref 5–15)
BUN: 72 mg/dL — ABNORMAL HIGH (ref 8–23)
CO2: 22 mmol/L (ref 22–32)
Calcium: 9.3 mg/dL (ref 8.9–10.3)
Chloride: 94 mmol/L — ABNORMAL LOW (ref 98–111)
Creatinine, Ser: 10.92 mg/dL — ABNORMAL HIGH (ref 0.61–1.24)
GFR, Estimated: 5 mL/min — ABNORMAL LOW (ref >60)
Glucose, Bld: 145 mg/dL — ABNORMAL HIGH (ref 70–99)
Phosphorus: 4.9 mg/dL — ABNORMAL HIGH (ref 2.5–4.6)
Potassium: 4 mmol/L (ref 3.5–5.1)
Sodium: 134 mmol/L — ABNORMAL LOW (ref 135–145)

## 2023-05-03 LAB — LIPID PANEL
Cholesterol: 90 mg/dL (ref 0–200)
HDL: 38 mg/dL — ABNORMAL LOW (ref >40)
LDL Cholesterol: 41 mg/dL (ref 0–99)
Total CHOL/HDL Ratio: 2.4 {ratio}
Triglycerides: 55 mg/dL (ref <150)
VLDL: 11 mg/dL (ref 0–40)

## 2023-05-03 LAB — HEMOGLOBIN A1C
Hgb A1c MFr Bld: 4.9 % (ref 4.8–5.6)
Mean Plasma Glucose: 93.93 mg/dL

## 2023-05-03 LAB — CBC
HCT: 33.7 % — ABNORMAL LOW (ref 39.0–52.0)
Hemoglobin: 10.2 g/dL — ABNORMAL LOW (ref 13.0–17.0)
MCH: 27.2 pg (ref 26.0–34.0)
MCHC: 30.3 g/dL (ref 30.0–36.0)
MCV: 89.9 fL (ref 80.0–100.0)
Platelets: 139 10*3/uL — ABNORMAL LOW (ref 150–400)
RBC: 3.75 MIL/uL — ABNORMAL LOW (ref 4.22–5.81)
RDW: 15.8 % — ABNORMAL HIGH (ref 11.5–15.5)
WBC: 3.8 10*3/uL — ABNORMAL LOW (ref 4.0–10.5)
nRBC: 0 % (ref 0.0–0.2)

## 2023-05-03 MED ORDER — ALTEPLASE 2 MG IJ SOLR: 2.0000 mg | Freq: Once | INTRAMUSCULAR | Status: DC | PRN

## 2023-05-03 MED ORDER — HEPARIN SODIUM (PORCINE) 1000 UNIT/ML DIALYSIS: 1000.0000 [IU] | INTRAMUSCULAR | Status: DC | PRN

## 2023-05-03 MED ORDER — HEPARIN SODIUM (PORCINE) 1000 UNIT/ML DIALYSIS
4000.0000 [IU] | Freq: Once | INTRAMUSCULAR | Status: AC
  Administered 2023-05-03: 4000 [IU] via INTRAVENOUS_CENTRAL
  Filled 2023-05-03 (×2): qty 4

## 2023-05-03 MED ORDER — AMLODIPINE BESYLATE 5 MG PO TABS
5.0000 mg | ORAL_TABLET | Freq: Every day | ORAL | Status: DC
  Administered 2023-05-04 – 2023-05-07 (×4): 5 mg via ORAL
  Filled 2023-05-03 (×4): qty 1

## 2023-05-03 MED ORDER — LIDOCAINE-PRILOCAINE 2.5-2.5 % EX CREA: 1.0000 | TOPICAL_CREAM | CUTANEOUS | Status: DC | PRN

## 2023-05-03 MED ORDER — PENTAFLUOROPROP-TETRAFLUOROETH EX AERO: 1.0000 | INHALATION_SPRAY | CUTANEOUS | Status: DC | PRN

## 2023-05-03 MED ORDER — LIDOCAINE HCL (PF) 1 % IJ SOLN: 5.0000 mL | INTRAMUSCULAR | Status: DC | PRN

## 2023-05-03 MED ORDER — ANTICOAGULANT SODIUM CITRATE 4% (200MG/5ML) IV SOLN: 5.0000 mL | Status: DC | PRN

## 2023-05-03 NOTE — Progress Notes (Signed)
Occupational Therapy Session Note  Patient Details  Name: RASHED EDLER MRN: [id number] Date of Birth: [birth year]  Today's Date: 05/03/2023 OT Individual Time: 1330-1340 OT Individual Time Calculation (min): 10 min  and Today's Date: 05/03/2023 OT Missed Time: 20 Minutes Missed Time Reason: Patient unwilling/refused to participate without medical reason (Eating lunch and did not want to participate)   Short Term Goals: Week 7:  OT Short Term Goal 1 (Week 7): LTG=STG 2/2 ELOS  Skilled Therapeutic Interventions/Progress Updates:    Pt greeted sitting in wc with family present after returning from dialysis. Pt reported being very hungry and only wanted to eat. Daughter went out to heat up patients soup brought from home by wife. OT discussed plans for family education with spouse tomorrow and goals for session. OT discussed home bathroom set-up and showed spouse picture of a tub transfer bench and educated on it's purpose and use as I do not think pt will be able to step over the side of the tub. Educated spouse that we will trial both tranfers tomorrow to best prepare her for assisting with BADL tasks and transfers at dc. Spouse verbalized understanding. Pt left seated in wc with spouse present and lunch set-up, needs met.   Therapy Documentation Precautions:  Precautions Precautions: Fall Restrictions Weight Bearing Restrictions: No General: General OT Amount of Missed Time: 20 Minutes Vital Signs: THerapy/Group: Individual Therapy  [name] 05/03/2023, 1:48 PM

## 2023-05-03 NOTE — Progress Notes (Signed)
PROGRESS NOTE   Subjective/Complaints  No issues ovenright. Doing well. Got PRN ativan before dialysis today; no behaviors noted before PRN. Gabapentin not given prior to dialysis.  Discussed with nursing.  Patient was accepted back to [facility] today!  Did well with dialysis.  No current complaints.   ROS: Limited by cognition.  Denies fevers, chills, N/V, abdominal pain, constipation, diarrhea, SOB, cough, chest pain, joint pain, new weakness or paraesthesias.  Pertinent positives per HPI above.  Objective:   No results found. Recent Labs    05/01/23 1322 05/03/23 0820  WBC 3.8* 3.8*  HGB 9.5* 10.2*  HCT 30.7* 33.7*  PLT 137* 139*         Recent Labs    05/03/23 0820  NA 134*  K 4.0  CL 94*  CO2 22  GLUCOSE 145*  BUN 72*  CREATININE 10.92*  CALCIUM 9.3         No intake or output data in the 24 hours ending 05/03/23 1033          Physical Exam: Vital Signs Blood pressure (!) 147/84, pulse 60, temperature 97.7 F (36.5 C), resp. rate 20, height 5\' 4"  (1.626 m), weight 94.4 kg, SpO2 99%.   Constitutional: No distress . Vital signs reviewed.  Laying in bed HEENT: NCAT, EOMI, oral membranes moist Cardiovascular: RRR without murmur. No JVD .  No peripheral edema. Respiratory/Chest: CTA Bilaterally without wheezes or rales. Normal effort    GI/Abdomen: BS +, non-tender, non-distended Ext: no clubbing, cyanosis, or edema.  Psych: Cooperative,  pleasant, appropriate.  Skin: No evidence of breakdown, no evidence of rash.  Left upper extremity AV fistula.   Neurologic:  + Cognitive deficits - oriented x3 today +Lethargy, responsive but slowed, arousable to verbal stimuli- ongoing cranial nerves II through XII grossly intact, Moving all 4 extremities antigravity  and against resistance, 4/5 throughout,  - + bilateral upper extremity tremors -mild, unchanged Musculoskeletal: No obvious  deformities.   No joint swelling.   Assessment/Plan: 1. Functional deficits which require 15 hr per week of interdisciplinary therapy in a comprehensive inpatient rehab setting. Physiatrist is providing close team supervision and 24 hour management of active medical problems listed below. Physiatrist and rehab team continue to assess barriers to discharge/monitor patient progress toward functional and medical goals  Care Tool:  Bathing    Body parts bathed by patient: Right arm, Left arm, Chest, Abdomen, Front perineal area, Right upper leg, Buttocks, Left upper leg, Right lower leg, Left lower leg, Face   Body parts bathed by helper: Right arm, Left arm, Chest, Abdomen, Front perineal area, Buttocks, Right upper leg, Left upper leg, Right lower leg, Left lower leg, Face     Bathing assist Assist Level: Moderate Assistance - Patient 50 - 74%     Upper Body Dressing/Undressing Upper body dressing   What is the patient wearing?: Pull over shirt    Upper body assist Assist Level: Minimal Assistance - Patient > 75%    Lower Body Dressing/Undressing      Lower body dressing      What is the patient wearing?: Underwear/pull up, Pants     Lower body assist Assist for  lower body dressing: Moderate Assistance - Patient 50 - 74%     Toileting Toileting    Toileting assist Assist for toileting: Minimal Assistance - Patient > 75%     Transfers Chair/bed transfer  Transfers assist     Chair/bed transfer assist level: Contact Guard/Touching assist     Locomotion Ambulation   Ambulation assist      Assist level: 2 helpers Assistive device: Walker-rolling Max distance: 90'   Walk 10 feet activity   Assist     Assist level: 2 helpers Assistive device: Hand held assist   Walk 50 feet activity   Assist Walk 50 feet with 2 turns activity did not occur: Safety/medical concerns  Assist level: Minimal Assistance - Patient > 75% Assistive device: No Device     Walk 150 feet activity   Assist Walk 150 feet activity did not occur: Safety/medical concerns  Assist level: Minimal Assistance - Patient > 75% Assistive device: No Device    Walk 10 feet on uneven surface  activity   Assist Walk 10 feet on uneven surfaces activity did not occur: Safety/medical concerns         Wheelchair     Assist Is the patient using a wheelchair?: Yes Type of Wheelchair: Manual    Wheelchair assist level: Dependent - Patient 0%      Wheelchair 50 feet with 2 turns activity    Assist        Assist Level: Dependent - Patient 0%   Wheelchair 150 feet activity     Assist      Assist Level: Dependent - Patient 0%   Blood pressure (!) 147/84, pulse 60, temperature 97.7 F (36.5 C), resp. rate 20, height 5\' 4"  (1.626 m), weight 94.4 kg, SpO2 99%.  Medical Problem List and Plan: 1. Functional deficits secondary to metabolic encephalopathy due to sepsis secondary to catheter peritonitis and hypoxemia              -patient may not shower             -ELOS/Goals: 16-18 days, PT/OT Sup, SLP min A - DC goal 10/21, home with outpatient HD!   - Per wife, patient will need to be SPV/CGA level with rolling walker for return home.   - 10/11: Long discussion with wife, by 10/15 we will need to dispo plan home with [facility] and ER for HD vs. to SNF with HD capabilities.  Currently, no options for home or OP dialysis.  Continuing to work on improving behaviors so that he may qualify for outpatient dialysis.  Not considering hospice/palliative at this time.  -10/15: Team thinks CGA with RW may be realistic given improvements over the last week. Will get family training this week. Plan for DC 10/21; hopeful will return to [facility] but if not will use ER for dialysis in the interim until they can establish. Offered SNF with Dialysis but wife prefers this plan.   10-17: Accepted back to [facility] for outpatient HD; proceed with family training, plan for  discharge 10/21             -Continue CIR therapies including PT, OT, and SLP   -[facility] -- encephalopathy/aggitation has developed since approximately 8/22, with decline earlier following COVID infection per wife   -Therapy QD schedule  - Con't CIR PT, OT and SLP  - Patient was seen by palliative care, changed to DNR status -Marcelino Duster with palliative continuing to follow  -Tried wife at bedside  with dialysis on 10-3, patient remained agitated and pulling at lines, per nephrology not a candidate for outpatient hemodialysis.     10/6 vascular is not recommending conversion to PD.   10-7: Palliative signed off  2.  Antithrombotics: -DVT/anticoagulation:  Pharmaceutical: Heparin, 03/23/23 changed to Lovenox -  consistent refusal -  started on Eliquis 2.5 mg BID - Dced 9/12 per cardiology "With patient's chronic anemia and agitation/recent falls, appears that risk of OAC>benefits at this time... ". SCDs at bedtime added 10/4.              -antiplatelet therapy: aspirin and Plavix --> ASA alone per cardiology 9/11   3. Pain Management:              -Lidoderm patches to back, Tylenol every 4 hours as needed's, muscle rub as needed, and oxycodone 2.5 mg 3 times daily as needed  -Elavil 25 mg at bedtime for nerve pain and sleep 8/27 -weaned off 9-7 due to oversedation  - No use of PRNs or general complaints of pain since 9-29  4.  Lethargy/ intermittent agitation /insomnia: Persistent despite interventions  -Weaned off of veil bed, remains on telesitter for safety.  Appears somewhat better with wife at bedside and when seated at the station.             -Admitted on Seroquel 50 mg TID -weaned off             -clonazepam 0.5 mg TID prn -transition to lorazepam 0.5 mg daily p.o. or IV as needed for anxiety, agitation -10-4 psychiatry premedicating dialysis with1 mg ativan -Currently have Atarax first-line and Ativan 1 mg scheduled predialysis for agitation; redirect and avoid  overstimulation as much as possible.   - Avoid increasing sedating medications as much as possible, as there is a direct effect on patient's sleep, confusion, and agitation up to a day after receiving PRN antipsychotics and Ativan.              - Dced Geodon 10 mg IM q 12 hours prn d/t  switched to Abilify per psychiatry 9-17             -8-28 added depakote 125 mg q8h for ongoing aggitation, mood stabilization -discontinued 9-2, resumed 9-21, increased 9-23 - level 18 subtherapeutic increased to 250 mg Q12h 10/4 -> psych changing to long-acting to 250 mg nightly 10-6; repeat levels 10-8 - <10  - 10-8: D/w psych adjusting vs. Dcing depakote; in agreement with plan, Depakote Dc'd 10/9 --> resumed 250 mg at bedtime per psych 10/14; report free levels likely much higher than plasma concentration and this dose may be adequite  - 9-17 Abilify started per psychiatry 5 mg -> 7.5 mg 9-27 -> 10 mg 10/11   - Will need OP labs, LFTs for monitorring  - 9-24 memantine 5 mg daily started per psychiatry, discontinued 10-3 per family request due to increasing confusion and lethargy -Psychiatry following, appreciate their assistance and recommendations in managing this patient - signed off 10/16 - Responsive to low stimulating environment, music, and redirection.  Remains intermittently agitated, especially with dialysis and at nighttime, but is significantly improving over the weeks.   10/8: Poor sleep overnight, remains pleasant and cooperative today and appreciate overall slight improvement in behaviors this week. 10/9: Neurology noting pre-existing cognitive deficits documented on the outpatient side; while these are significantly worse, may be beneficial to reach out to other family members regarding his prior cognitive baseline.  Will also look into  family bringing in CPAP. 10/12: CPAP ordered.  Psych reccommending possibly increasing abilify next week if tolerated. D/t no PRN use in some time, DC at bedtime  scheduled trazadone to limit psychoactive medication burden.   10/13: Poor sleep, resume trazadone 50 mg at bedtime.  As seen with prior administrations, believe predialysis Ativan is causing significant lethargy and confusion up to a day after administration 10/15 - 17: remains impulsive overnights but no PRNs needed -behaviors do seem reduced with readdition of Depakote  5. Hypoactive delirium/metabolic encephalopathy/dementia with sundowning: This patient is not capable of making decisions on his own behalf.  Persistent despite significant efforts to workup/treat.  -  8/29: EEG negative, 8-30: MRI showing chronic microvascular changes, otherwise normal.  Multiple lab workups for encephalopathy with CMP, ammonia, CBC negative except for low VPA.   -  10-2: Lethargic, jerking , BG 70s, improved with POs.  Resume blood glucose check twice daily. Repeat VPA levels (18).  CMP CBC look okay.    -10/8: Neuro also to eval as above; appreciate their assistance and recommendations. -recommending discontinuation of Atarax and Ativan; will consider if he is able to tolerate transition to dialysis chair. Appreciate their assistance and recommendations.  Folate, B12, and TSH normal.   - 10/13: Will add 300 mg gabapentin to pre-dialysis, Ativan change to PRN 0.5 mg during dialysis only to target behaviors.   - 10/15: During dialysis [facility] improved behaviors with earlier time and allowed to sit in chair; 10-17 continuing to do well, reaccepted to outpatient HD   6. Skin/Wound Care: Routine skin care checks             -sacral skin off-loading/protection  DC IV 10/16   7. Fluids/Electrolytes/Nutrition: Strict Is and Os and follow-up chemistries per nephrology             - renal diet/ 1200 cc FR  - Renvela 800 mg 3 times daily for hyperphosphatemia added per nephrology 8-27   - Management per nephrology, labs appear approximately stable, p.o. intakes are appropriate at this time but wax and wane with lethargy    - 10/2-10: Mild hyponatremia 134-133 recently; is not worsening, has dipped into this range before, no apparent cognitive effects. Continue management with dialysis. 10/10 - resolved    8: Hypotension/Hypertension: monitor TID and prn             -continue midodrine as needed during dialysis  9-1: add midodrine 5 mg tid and monitor.  DC Coreg as heart rate has been low to normal; may resume at low-dose once blood pressure improved.  9/13 DC midodrine   - Intermittent hypertension into the 150s, is higher on off dialysis days.  Would maintain higher perfusion pressure given microvascular changes seen on MRI.  No medication adjustments for now.  10-7 - 10/9 well-controlled, with intermittent transient elevations.  10/11: Add amlodipine 2.5 mg daily for consistent mild elevations--> normotensive 10/12-13  -10/17-> increase amlodipine to 5 mg daily    05/03/2023   10:30 AM 05/03/2023   10:00 AM 05/03/2023    9:30 AM  Vitals with BMI  Systolic 147 137 161  Diastolic 84 84 87  Pulse 60 65 61     9: Hyperlipidemia: continue statin   10: CAD s/p CABG 2023: on DAPT and statin.  - troponin elevated but stable, monitor 8/27   11: DM-2: A1c = 6.3% (at home on Lantus 22 units at bedtime, Tradjenta 5 mg daily)        -  monitor PO intake             -continue SSI (0-6 units) does not appear to be requiring coverage   - 9/6 DC SSI, change CBGs to daily   - 10-2: Hypoglycemic today, 70s, improved with p.o. intakes, 88 at recheck.  Likely due to poor p.o. intakes last few days.  Start CBG checks twice daily  - 10-4: Patient agitated with CBG checks, refusing.  Moved to as needed for lethargy, AM  - Intermittent mild elevations on BMP but not warranting Tx currently (<200)     12: ESRD: HD on Tues, Thurs, Sat -nephrology made aware  -Regularly have to hold dialysis or stopped early due to agitated behaviors, which is ongoing.  Recently has been able to receive all or part of most dialysis  sessions.  -Have tried premedicating patient and providing as needed medications with Haldol, Ativan as available on dialysis unit; measures helped temporarily but overall do not seem to affect treatment  -Wife at bedside for dialysis 10-3, did not improve behaviors, per nephrology patient is not a candidate for outpatient dialysis.     -10/5-6 see discussion above. Wife tries to help in HD. However, behaviors continue to make HD difficult.  -Unfortunately, vascular not offering conversion to PD.  Will discuss possible options with nephrology.    - 10/8:  Nephro to proved detailed notes on dialysis behaviors this week to target; goal to be OP HD candidate -agitated 10-10 and 10/12 session, continue to work on behaviors as above  10/15: trialling earlier dialysis session today - went very well! D/w nephology continuing earlier times to avoid aggitation (?sundowning) and allow full sessions. No PRN ativan or atarax needed with pre-gabapentin.  Again, successful session 10-17  13: COPD/OSA; chronic home 2L O2 (Symbicort, Ventolin, prednisone 40 mg daily at home)             -continue symbicort  -Added home oxygen back 8-27  -Satting well on room air, oxygen not required   -10/11: Patient's wife reports home CPAP machine not usable; RT consult to resume nighttime CPAP   - not using   14: GERD: continue Protonix   15: Non-sustained VT/pAF/bradycardia: resolved. per cardiology, Dr. Algie Coffer: -continue carvedilol 6.25 mg BID; consider increasing to 12.5 mg BID before starting low dose amiodarone  -consider Eliquis and discontinue asa and Plavix in one week if no active GI bleeding and stable hemoglobin from 8/24 -hemoglobin has remained stable but very low around 7, no recurrent A-fib, monitor for now given agitation/fall 8-31 and consider initiating Eliquis when safe -Some intermittent recurrent bradycardia while at inpatient rehab, improved with discontinuation of Elavil and Geodon -9/11 EKG for  bradycardia episode today, 2nd degree A-V block mobitz type 1.  Continue to hold beta blocker,9/11 will restart eliquis at 2.5 mg BID dose. Will consult cardiology regarding need for ASA/plavix and coreg.  Dr. Elease Hashimoto does not appear convinced that he has A-fib 9/12 per cardiology, DAPT transition to daily aspirin 81 only and Eliquis was discontinued - Heart rate currently well-controlled, hypertension as above, continuing to hold Eliquis due to falls/poor safety awareness, starting SCDs as above   16: HFrEF: Daily weights fluctuating significantly d/t dialysis. Monitor for signs of overload. No current concerns. Weights / volume stable with dialysis.    17: Liver nodularity on CT scan: LFTs reported to be normal. Avoid hepatotoxic medications    -Abdominal ultrasound earlier this admission with minimal ascites  -Ammonia levels have remained normal on  multiple checks   18: Epistaxis - resolved             -previously improved with flonase and saline spray   19: Morbid Obesity. Dietary counseling Body mass index is 35.72 kg/m.    20. Hematuria - resolved  9/22- required in/out to check U/A- having new hematuria- nursing to keep an eye- since also bleeding from penis a little- monitor   9/23 no wBCs  on UA, follow  urine culture-Staphylococcus hemolyticus-discussed with pharmacy and ID pharmacy.  Think this is more likely contaminant  9/25 discuss with nephrology about possible treatment or other evaluation options  Spoke with Dr. Ronalee Belts, most likely contaminant.  Recommended treating only if symptomatic.  If hematuria continues consider urology input  9/27 urine has improved to clear yellow   21.  Vitamin C deficiency.  Aggressive repletion per psychiatry, 100 mg IV x 6+ 500 mg p.o. daily.  Feel this may help his cognition.  -04-22-09: Am noting some slightly improved attention, memory on gross exam since IV course; continue PO supplementation  23.  Chest pain a.m. 10-3.  EKG unchanged,  self resolved.  -control bp as possible  24. Bilateral ankle pain. Changing locations, no known trauma, no instability, edema, or point tenderness. ?Peripheral neuropathy related; may benefit from Qutenza but would not add sedating medications for tx at this time.   -Resolved  LOS: 52 days A FACE TO FACE EVALUATION WAS PERFORMED  Angelina Sheriff 05/03/2023, 10:33 AM

## 2023-05-03 NOTE — Plan of Care (Signed)
  Problem: RH Wheelchair Mobility Goal: LTG Patient will propel w/c in controlled environment (PT) Description: LTG: Patient will propel wheelchair in controlled environment, # of feet with assist (PT) Outcome: Not Met (add Reason) Note: Goal discontinued due to pt wife stating home is not accessible by [facility]

## 2023-05-03 NOTE — Patient Care Conference (Signed)
Inpatient RehabilitationTeam Conference and Plan of Care Update Date: 05/01/2023   Time: 10:40 AM    Patient Name: [name] [name]      Medical Record Number: [id number]  Date of Birth: 05/26/1951 Sex: Male         Room/Bed: 4W13C/4W13C-01 Payor Info: Payor: MEDICARE / Plan: MEDICARE PART A AND B / Product Type: *No Product type* /    Admit Date/Time:  03/12/2023  4:25 PM  Primary Diagnosis:  Acute metabolic encephalopathy  Hospital Problems: Principal Problem:   Acute metabolic encephalopathy Active Problems:   Type 2 diabetes mellitus with chronic kidney disease on chronic dialysis, with long-term current use of insulin (HCC)   HFrEF (heart failure with reduced ejection fraction) (HCC)   Morbid obesity (HCC)   Encephalopathy   Epistaxis   PAF (paroxysmal atrial fibrillation) (HCC)   Long term (current) use of insulin (HCC)   ESRD on dialysis [facility])   Debility   Agitation   Hematuria    Expected Discharge Date: Expected Discharge Date: 05/07/23  Team Members Present: Physician leading conference: Dr. Elijah Birk Social Worker Present: Cecile Sheerer, LCSWA Nurse Present: Vedia Pereyra, RN PT Present: Darrold Span, PT OT Present: Kearney Hard, OT SLP Present: Feliberto Gottron, SLP PPS Coordinator present : Fae Pippin, SLP     Current Status/Progress Goal Weekly Team Focus  Bowel/Bladder   Pt is currentlycontinent B/B. HD Patient. (Oliguric)  LBM 04/29/23   Pt will maintain  B/B function with normalpattern   Assist pt with toileting needs qshift/prn    Swallow/Nutrition/ Hydration               ADL's   Min A ambulation with RW within functional tasks, redirectable mostly within BADL tasks   mod A overall   Barriers to dc are agitation/behaviors within dialysis, poor safety awareness, self-care retraining    Mobility   CGA/minA overall, gait with RW 90' CGA no WC follow, improved safety awareness, decreased impulsive sitting   minA ambulatory  household distances, WC for community, caregiver goals  barriers: confusion, lethargy; continue focus on gait training, global strengthening and endurance, safety awareness with mobility    Communication                Safety/Cognition/ Behavioral Observations  Cognitive functioning tends to fluctuate but patient is overall Mod-Max A for orientation with use of external aids and attention.   Mod A for orientation and sustained attention   orientation with use of aids, sustained attention, basic safety awareness/problem solving    Pain   Pt denies pain at this time.   Pt will be free from pain and verbalize pain as needed   Assess pt forpain qshift/prn    Skin   Pt skin is intact with no physical breakdown   Pt will maintaian skin intergrity  Assess pt skin qshift/prn and educate pt on howto prevent skin breakdown      Discharge Planning:  Pt unable to be switched to PD. Pt continues to have behaviors while in HD and unable to secure outpatient HD. May consider outof state locations for HD and SNF. Marland Kitchen Wife is willing to take pt home if this is the only option. SW will confirm there are no barriers to discharge.   Team Discussion: Acute metabolic encephalopathy.  Behavior plan and tele-sitter in place. Behaviors continue to improve. Continent with time toileting. Occasional pain managed with PRN medications. Insomnia. Skin remains intact. HD T/Th/Sat. Daily weights. Tolerating renal carb-mod diet with  1200 fluid restrictions. Lethargy improved from weekend.  Barriers with therapy are poor safety awareness, retraining of self care, use of orientation and problem solving strategies.  Continuing to work on strengthening, endurance, and gait training. Patient on target to meet rehab goals: Will meet most goals when having a good day.  Discharge set 05/07/23  *See Care Plan and progress notes for long and short-term goals.   Revisions to Treatment Plan:  Continue to monitor behaviors.  Continue sleep chart. Checking blood sugar with AMS. Continue every day therapy. Medication adjustments. Discharge plan needed from wife today 05/01/23 to take home or SNF. Family education this week. Monitor labs and VS Teaching Needs: Medications, safety, self care, gait/transfer training, use of learned strategies, etc.   Current Barriers to Discharge: Decreased caregiver support, Hemodialysis, and Behavior  Possible Resolutions to Barriers: Family education Set up with out patient dialysis Continued improved behaviors Order recommended DME     Medical Summary Current Status: medically complicated by hypertension,ESRD, encephalopathy, behavioral difficulties, intermittent pain, and insomnia  Barriers to Discharge: Behavior/Mood;Electrolyte abnormality;Medical stability;Morbid Obesity;Renal Insufficiency/Failure;Self-care education;Uncontrolled Hypertension  Barriers to Discharge Comments: behaviors limiting outpatient dialysis options, encephalopathy, insomnia Possible Resolutions to [name]: titrate pain medications to minimum tolerated doses for function, frequent reotientation, limit sedating meds to minimal required, , monitor vitals and increase BP regimen as approrpiate, monitor labs   Continued Need for Acute Rehabilitation Level of Care: The patient requires daily medical management by a physician with specialized training in physical medicine and rehabilitation for the following reasons: Direction of a multidisciplinary physical rehabilitation program to maximize functional independence : Yes Medical management of patient stability for increased activity during participation in an intensive rehabilitation regime.: Yes Analysis of laboratory values and/or radiology reports with any subsequent need for medication adjustment and/or medical intervention. : Yes   I attest that I was present, lead the team conference, and concur with the assessment and plan of the  team.   [name] 05/01/2023; 10:40 AM

## 2023-05-03 NOTE — Progress Notes (Addendum)
Physical Therapy Weekly Progress Note  Patient Details  Name: [name] [name] MRN: [id number] Date of Birth: 02-09-1951  Beginning of progress report period: April 27, 2023 End of progress report period: May 03, 2023  Today's Date: 05/03/2023 PT Individual Time: 4098-1191 PT Individual Time Calculation (min): 25 min   Patient continues to progress towards long term goals.  Pt has demonstrated improvement over the past week in activity tolerance, gait mechanics, and standing balance as well as improved safety awareness during functional mobility and ability to follow verbal cues. Pt is currently CGA/min assist overall, ambulating up to 49' with RW CGA/min assist without WC follow. Pt completes up/down 4 steps with BHRs min assist, car transfer min assist, and ambulating up/down ramp with RW min assist. Pt will have formal family training with wife prior to DC.  Patient continues to demonstrate the following deficits muscle weakness, decreased cardiorespiratoy endurance, impaired timing and sequencing and decreased motor planning,  , decreased awareness, decreased problem solving, decreased safety awareness, and decreased memory, and decreased sitting balance, decreased standing balance, decreased postural control, and decreased balance strategies and therefore will continue to benefit from skilled PT intervention to increase functional independence with mobility.  Patient progressing toward long term goals..  Continue plan of care. WC goal discontinued due to pt wife reporting home is not accessible by WC.  PT Short Term Goals Week 7:  PT Short Term Goal 1 (Week 7): To continue to consistently meet LTG PT Short Term Goal 1 - Progress (Week 7): Progressing toward goal Week 8: PT Short Term Goal 1 (Week 8): To continue to consistently meet LTG  Skilled Therapeutic Interventions/Progress Updates:  Ambulation/gait training;Community reintegration;DME/adaptive equipment  instruction;Psychosocial support;Stair training;Neuromuscular re-education;Wheelchair propulsion/positioning;UE/LE Strength taining/ROM;Balance/vestibular training;Discharge planning;Pain management;Therapeutic Activities;UE/LE Coordination activities;Cognitive remediation/compensation;Functional mobility training;Disease management/prevention;Patient/family education;Therapeutic Exercise;Visual/perceptual remediation/compensation   Therapy Documentation Precautions:  Precautions Precautions: Fall Restrictions Weight Bearing Restrictions: No   Therapy/Group: Individual Therapy  Edwin Cap PT, DPT 05/03/2023, 4:15 PM

## 2023-05-03 NOTE — Progress Notes (Signed)
   05/03/23 1211  Vitals  Temp 98.1 F (36.7 C)  Pulse Rate 63  Resp 14  BP (!) 141/82  SpO2 95 %  O2 Device Room Air  Weight  (unable to weigh----pt chair)  Type of Weight Post-Dialysis  Oxygen Therapy  Patient Activity (if Appropriate) In bed  Pulse Oximetry Type Continuous  Oximetry Probe Site Changed No  Post Treatment  Dialyzer Clearance Lightly streaked  Hemodialysis Intake (mL) 0 mL  Liters Processed 51  Fluid Removed (mL) 1400 mL  Tolerated HD Treatment Yes  Post-Hemodialysis Comments increased  arterial  bleeding from access site---surgifoam in use  AVG/AVF Arterial Site Held (minutes) 20 minutes  AVG/AVF Venous Site Held (minutes) 10 minutes   Received patient in bed to unit.  Alert and oriented.  Informed consent signed and in chart.   TX duration:2.30---pr signed off ama---katy stovall pa is aware  Patient tolerated well.  Transported back to the room  Alert, without acute distress.  Hand-off given to patient's nurse.   Access used: LUAF Access issues: see progress notes  Total UF removed: 1400 Medication(s) given: none   [facility]

## 2023-05-03 NOTE — Progress Notes (Signed)
SLP Cancellation Note  Patient Details Name: [name] [name] MRN: [id number] DOB: 03-May-1951   Cancelled treatment:        Upon entrance, patients wife actively transporting patient into shower. SLP educated wife/patient on speech therapy treatment scheduled for this time. Patient verbalized refusal to participate in ST this date due to shower and fatigue after dialysis. RN aware. SLP will attempt to return per patient and therapist schedule.                                                                                               Clarice Zulauf M.A., CF-SLP 05/03/2023, 2:23 PM

## 2023-05-03 NOTE — Progress Notes (Signed)
Pt rested well this shift. Appears to be calm and cooperative with less agitation and redirectable. Able to follow direction with intermittent confusion noted. Up eating breakfast and ready for dialysis this am. Pt has been compliant with ADLs/medication regimen. PRN Lorazepam given per order for dialysis.

## 2023-05-03 NOTE — Progress Notes (Signed)
Physical Therapy Session Note  Patient Details  Name: RUBY DILONE MRN: [id number] Date of Birth: 09-17-1950  Today's Date: 05/03/2023 PT Individual Time: 8413-2440 PT Individual Time Calculation (min): 25 min   Short Term Goals: Week 7:  PT Short Term Goal 1 (Week 7): To continue to consistently meet LTG  Skilled Therapeutic Interventions/Progress Updates:    Pt presents in room in [facility], initially not agreeable to PT due to wanting "food to digest" however agreeable with education on upcoming discharge home and to practice stairs for home. Pt denies pain. Session focused on gait training for stair negotiation, preparation for transfers, and ambulating up/down incline.  Pt transported to main gym dependently for time management. Pt set up in front of chairs, provided with explicit cues for therapist position during stair negotiation to allow pt adequate sequencing during transfers. Pt ambulates up/down 4  6" steps with BHRs with min assist and verbal cues for whole foot placed on step, pt with dififculty with foot placement descending due to size of shoe.  Pt then transported to ortho gym and completes ambulatory transfer from RW to car with CGA, cues for positioning prior to sit and bringing BLEs out of car prior to standing.  Pt then ambulates up/down ramp with CGA for incline, min assist for decline due to pt demonstrating decreased proximity to RW and increased cadence and speed. Pt returns to sitting in [facility] and transported back to room. Pt remains seated in WC with all needs within reach, cal light in place and chair alarm donned and activated at end of session.   Therapy Documentation Precautions:  Precautions Precautions: Fall Restrictions Weight Bearing Restrictions: No  Therapy/Group: Individual Therapy  Edwin Cap PT, DPT 05/03/2023, 3:54 PM

## 2023-05-03 NOTE — Progress Notes (Signed)
Out Patient Arrangements:  Have been requested to arrange out pt HD for pt. Have submitted medical records to [facility] admission. Please advise of d/c date.   Andree Elk HPSS [phone]

## 2023-05-03 NOTE — Progress Notes (Signed)
Physical Therapy Discharge Summary  Patient Details  Name: [name] [name] MRN: [id number] Date of Birth: [birth year]  Date of Discharge from PT service:May 06, 2023  Today's Date: 05/06/2023 PT Individual Time: 1100-1155 PT Individual Time Calculation (min): 55 min    Patient has met 7 of 7 long term goals due to improved activity tolerance, improved balance, improved postural control, increased strength, improved attention, improved awareness, and improved coordination.  Patient to discharge at an ambulatory level Min Assist.   Patient's care partner is independent to provide the necessary physical and cognitive assistance at discharge.  Reasons goals not met: N/a  Recommendation:  Patient will benefit from ongoing skilled PT services in home health setting to continue to advance safe functional mobility, address ongoing impairments in dynamic standing balance, activity tolerance, gait mechanics, safety awareness with functional mobility and minimize fall risk.  Equipment: No equipment provided  Reasons for discharge: treatment goals met and discharge from hospital  Patient/family agrees with progress made and goals achieved: Yes  PT Discharge Precautions/Restrictions Precautions Precautions: Fall Precaution Comments: impulsive Restrictions Weight Bearing Restrictions: No Pain Interference Pain Interference Pain Effect on Sleep: 8. Unable to answer Pain Interference with Therapy Activities: 8. Unable to answer Pain Interference with Day-to-Day Activities: 8. Unable to answer Cognition Orientation Level: Oriented X4 Sensation Sensation Light Touch: Appears Intact Coordination Gross Motor Movements are Fluid and Coordinated: No Fine Motor Movements are Fluid and Coordinated: No Coordination and Movement Description: decreased fluidity and accuracy, slight resting tremor noted Motor  Motor Motor: Other (comment) Motor - Discharge Observations: generalized weakness,  improved from evaluation  Mobility Bed Mobility Bed Mobility: Rolling Right;Supine to Sit;Sit to Supine Rolling Right: Supervision/verbal cueing Supine to Sit: Supervision/Verbal cueing Sit to Supine: Supervision/Verbal cueing Transfers Transfers: Stand Pivot Transfers;Stand to Sit;Sit to Stand Sit to Stand: Contact Guard/Touching assist Stand to Sit: Contact Guard/Touching assist Stand Pivot Transfers: Contact Guard/Touching assist Transfer (Assistive device): Rolling walker Locomotion  Gait Ambulation: Yes Gait Assistance: Contact Guard/Touching assist Gait Distance (Feet): 150 Feet Assistive device: Rolling walker Stairs / Additional Locomotion Stairs: Yes Stairs Assistance: Minimal Assistance - Patient > 75% Stair Management Technique: Two rails Number of Stairs: 4 Height of Stairs: 6 Pick up small object from the floor assist level: Contact Guard/Touching assist Pick up small object from the floor assistive device: rollin walker and Chief Operating Officer Mobility: Yes Wheelchair Assistance: Doctor, general practice: Both upper extremities Distance: 50  Trunk/Postural Assessment  Cervical Assessment Cervical Assessment: Within Functional Limits Thoracic Assessment Thoracic Assessment: Within Functional Limits Lumbar Assessment Lumbar Assessment: Within Functional Limits Postural Control Postural Control: Deficits on evaluation Righting Reactions: delayed and inadequate Protective Responses: delayed Postural Limitations: decreased  Balance Balance Balance Assessed: Yes Static Sitting Balance Static Sitting - Balance Support: Feet supported Static Sitting - Level of Assistance: 5: Stand by assistance Dynamic Sitting Balance Dynamic Sitting - Balance Support: Feet supported Dynamic Sitting - Level of Assistance: 5: Stand by assistance Static Standing Balance Static Standing - Balance Support: During functional  activity;Bilateral upper extremity supported Static Standing - Level of Assistance: 5: Stand by assistance (CGA) Dynamic Standing Balance Dynamic Standing - Balance Support: Bilateral upper extremity supported;During functional activity Dynamic Standing - Level of Assistance: 5: Stand by assistance (CGA) Extremity Assessment  RLE Assessment RLE Assessment: Exceptions to [facility] General Strength Comments: grossly 3+/5, assessed functionally LLE Assessment LLE Assessment: Exceptions to [facility] General Strength Comments: grossly 3+/5, assessed functionally  Treatment Session: Pt was seen bedside in the  am sitting up in chair. Pt transferred recliner to w/c with contact guard assist. Pt transported to gym. Pt performed car transfers with rolling walker and contact guard assist. Pt performed mulitple sit to stand and stand pivot transfers with contact guard and rolling walker. Pt ambulated 10 feet on uneven surface with rolling walker and min A. Pt ambulated 50 feet and 150 feet with rolling walker and contact guard. Pt ascended/descended 4 stairs with B rails and min A. Pt propelled w/c about 50 feet with B UEs and S. Pt returned to room and left sitting up in recliner with chair alarm on and all needs within reach.     Rayford Halsted PT, DPT 05/06/2023, 12:30 PM

## 2023-05-03 NOTE — Progress Notes (Signed)
Informed by dialysis RN that HD 1 hour early - was getting anxious, despite hydroxyzine and ginger ale (which he usually likes). HD ended, needles removed without incident. Spoke to him just now - he is calm, says he just wanted to be done for today.  Ozzie Hoyle, PA-C [employer] Pager [phone]

## 2023-05-03 NOTE — Progress Notes (Signed)
Patient ID: [name] [name], male   DOB: 09/01/1950, 72 y.o.   MRN: [id number]  SW received updates pt as accepted to dialysis clinic. Per dialysis coordinator's note: [facility] on their TTS shift w/ a time of 9:45 am.   SW called pt wife to inform on above. SW also shared updates on HHA. Fam edu remains in place for tomorrow.   Cecile Sheerer, MSW, LCSWA Office: [phone] Cell: [phone] Fax: [phone]

## 2023-05-03 NOTE — Progress Notes (Signed)
[facility] Progress Note   Subjective:   Seen at onset of HD, calm/pleasant and sitting in recliner - 2.5L UFG. Denies CP or dyspnea.  Objective Vitals:   05/02/23 0731 05/02/23 1435 05/02/23 1924 05/02/23 1926  BP:  119/69 116/73   Pulse:  75 66   Resp:  20 18   Temp:  98.1 F (36.7 C) 98.7 F (37.1 C)   TempSrc:   Oral   SpO2: 97% 92% 98% 95%  Weight:      Height:       Physical Exam General: Well appearing man, NAD. Calm and pleasant today. Heart: RRR; no murmur Lungs: CTAB; no rales Abdomen: soft Extremities: trace BLE edema Dialysis Access: LUE AVF + bruit  Additional Objective Labs: Basic Metabolic Panel: Recent Labs  Lab 04/26/23 1330 04/28/23 1037  NA 137 138  K 4.2 4.3  CL 96* 97*  CO2 24 24  GLUCOSE 103* 137*  BUN 49* 68*  CREATININE 10.77* 11.50*  CALCIUM 9.1 9.2  PHOS 4.4 4.6   Liver Function Tests: Recent Labs  Lab 04/26/23 1330 04/28/23 1037  ALBUMIN 2.5* 2.5*   CBC: Recent Labs  Lab 04/26/23 1330 04/28/23 1037 05/01/23 1322  WBC 4.4 4.1 3.8*  NEUTROABS  --  2.8  --   HGB 10.5* 10.4* 9.5*  HCT 34.4* 33.8* 30.7*  MCV 90.8 88.0 90.0  PLT 170 167 137*   Medications:  anticoagulant sodium citrate      (feeding supplement) PROSource Plus  30 mL Oral BID BM   amLODipine  2.5 mg Oral Daily   ARIPiprazole  10 mg Oral QHS   ascorbic acid  500 mg Oral Daily   aspirin EC  81 mg Oral Daily   atorvastatin  80 mg Oral Daily   Chlorhexidine Gluconate Cloth  6 each Topical Q0600   darbepoetin (ARANESP) injection - DIALYSIS  150 mcg Subcutaneous Q Sun-1800   divalproex  250 mg Oral QHS   feeding supplement  237 mL Oral BID WC   gabapentin  300 mg Oral Q T,Th,Sa-HD   Gerhardt's butt cream  1 Application Topical QID   heparin  4,000 Units Dialysis Once in dialysis   lidocaine  1 patch Transdermal Q2200   lidocaine  1 patch Transdermal Q24H   mometasone-formoterol  2 puff Inhalation BID   pantoprazole  40 mg Oral BID    traZODone  50 mg Oral QHS    Dialysis Orders: Previously on CCPD thru [facility] - changed to HD - will need outpatient HD unit established prior to d/c. 4hr, 3K, EDW ~94, L AVF    Assessment/Plan:   Debility - in CIR. Appropriateness for CIR nearing its end - discharge date is 10/21. Encephalopathy/agitation: Waxing/waning agitation, dementia, has significant sundowning. No acute findings on imaging. Neuro and Psych consulting to help improve behavior. He didn't do well with HD on 10/9 and 10/12. Did not improve with wife at bedside previously, not a candidate for home dialysis at this time. Meds being adjusted per primary team/psychiatry team - much improved this week - agreeable/calm and dialyzing in recliner.  ESRD: Transitioned from PD to HD - TTS schedule. Using old AVF which is still functional. TRYING TO DIALYZE AS EARLY AS POSSIBLE IN DAY IN RECLINER, this makes a huge difference because he gets significant sundowning - did tremendously better on 10/15, 10/17 so far. Hypertension/volume: BP ok - off midodrine, now on low dose amlodipine. + edema. Anemia: Continue Aranesp weekly. Last  Tsat 38% with ferritin 2175. Hgb stable. Metabolic bone disease: CorrCa and phos in goal. No VDRA.  Nutrition:  Alb low, continue supps.  Renal diet w/fluid restrictions.  Hx NSVT: no meds at this time. Hx culture negative peritonitis and associated fungemia + enterococcus bacteremia - underwent PD cath removal, TDC placement and removal, temp HD cath placement  and removal, and line holiday. Completed IV unasyn, IV micafungin and cipro on 8/15. TDC not replaced cause he had functional AVF Dispo - Previous issues with safety during HD making patient unable to discharge. He is not a candidate for LTAC per SW. Reviewed consideration for hospice - wife not interested in this. SEE ABOVE, MUCH BETTER ON 10/15. Will CLIP back to prior HD unit. Discharging home without dialysis plan is not a good option.  Coming to ED for intermittent dialysis is typically reserved only for patients who have been kicked out of area centers. Given improvement in behavior, he appears stable for outpatient dialysis. If unable to arrange by discharge, then safest option is to arrange transfer back to [facility] medicine service (not CIR) for placement.   Ozzie Hoyle, PA-C 05/03/2023, 9:06 AM  [employer]

## 2023-05-03 NOTE — Progress Notes (Signed)
Out Patient Arrangements:  Pt has been accepted @ [facility] on their TTS shift w/ a time of 9:45 am.   The address is  [address],  [phone]  Please advise of d/c date.  Andree Elk HPSS [phone]

## 2023-05-04 MED ORDER — CHLORHEXIDINE GLUCONATE CLOTH 2 % EX PADS: 6.0000 | MEDICATED_PAD | Freq: Every day | CUTANEOUS | Status: DC

## 2023-05-04 NOTE — Progress Notes (Signed)
Physical Therapy Session Note  Patient Details  Name: CHIRON UMSTED MRN: [id number] Date of Birth: [birth year]  Today's Date: 05/04/2023 PT Individual Time: 1510-1535 PT Individual Time Calculation (min): 25 min   Short Term Goals: Week 8: PT Short Term Goal 1 (Week 8): To continue to consistently meet LTG  Skilled Therapeutic Interventions/Progress Updates:     Pt received seated on bed and agrees to therapy. No complaint of pain. Pt's wife present for family education. PT provides update on pt's progress with therapy and recommendations for safe mobility following discharge, as well education on safe car transfers and step navigation. Pt performs stand step transfer to [facility] with CGA and cues for sequencing and positioning. WC transport to gym. Pt stand with CGA and ambulates x75' with CGA initially progressing to minA with fatigue, with tactile cues at trunk for upright posture, as well as verbal cues for fluid, long stride lengths to improve balance. As pt fatigues he tends to take short, "choppy" steps and center of gravity shifts anterior to base of support. Following rest break, pt ambulates additional x80' with improved performance. WC transport back to room. Left seated with wife present.   Therapy Documentation Precautions:  Precautions Precautions: Fall Precaution Comments: impulsive Restrictions Weight Bearing Restrictions: No  Therapy/Group: Individual Therapy  Beau Fanny, PT, DPT 05/04/2023, 3:46 PM

## 2023-05-04 NOTE — Progress Notes (Signed)
Speech Language Pathology Daily Session Note  Patient Details  Name: LOVELLE WESTLING MRN: [id number] Date of Birth: 18-Jan-1951  Today's Date: 05/04/2023 SLP Individual Time: 1400-1425 SLP Individual Time Calculation (min): 25 min  Short Term Goals: Week 8: SLP Short Term Goal 1 (Week 8): Pt will orient to time and  situation given external aids with min A SLP Short Term Goal 2 (Week 8): Pt will complete functional problem solving with 60% acc with max A SLP Short Term Goal 3 (Week 8): Patient will sustain attention to functional task for 10 minutes given min multimodal cues SLP Short Term Goal 4 (Week 8): Pt will answer biographical and personal questions with 90% accuracy with min multimodal cues  Skilled Therapeutic Interventions: Skilled treatment session focused on completion of education with the patient and his wife.  SLP facilitated session by providing education regarding patient's current cognitive deficits and strategies to utilize at home to maximize recall, attention and overall safety. SLP also emphasized the importance of 24 hour supervision and establishing a routine at home to help make life predictable. A list of activities and tasks that patient can participate in at home were also discussed to help maximize cognitive engagement. Patient and his wife verbalized understanding of all information and handouts were given to reinforce education. Patient left upright in wheelchair with alarm on and all needs within reach. Continue with current plan of care.        Pain No/Denies Pain   Therapy/Group: Individual Therapy  Mata Rowen 05/04/2023, 3:28 PM

## 2023-05-04 NOTE — Progress Notes (Signed)
[facility] Progress Note   Subjective:   Patient seen and examined in room. Reports being at dialysis for 5 hrs yesterday and does not want it to happen again. Per notes he signed off 1 hr early. Otherwise dialysis has been going well.  Accepted back to his old unit.  Denies CP, SOB, abdominal pain and n/v/d.   Objective Vitals:   05/03/23 1458 05/03/23 2014 05/03/23 2020 05/04/23 0436  BP: (!) 131/111 121/70  123/78  Pulse: 75 71  82  Resp: 20 18  18   Temp: 97.9 F (36.6 C) 98.5 F (36.9 C)  98 F (36.7 C)  TempSrc: Oral Oral    SpO2: 99% 100% 100% 94%  Weight:      Height:       Physical Exam General:chronically ill appearing male in NAD Heart:RRR, no mrg Lungs:CTAB, nml WOB on RA Abdomen:soft, NTND Extremities:no LE edema Dialysis Access: LU AVF +b/t   Filed Weights   04/26/23 1338 04/28/23 1329  Weight: 88.8 kg 94.4 kg    Intake/Output Summary (Last 24 hours) at 05/04/2023 1153 Last data filed at 05/04/2023 0800 Gross per 24 hour  Intake 138 ml  Output 1400 ml  Net -1262 ml    Additional Objective Labs: Basic Metabolic Panel: Recent Labs  Lab 04/28/23 1037 05/03/23 0820  NA 138 134*  K 4.3 4.0  CL 97* 94*  CO2 24 22  GLUCOSE 137* 145*  BUN 68* 72*  CREATININE 11.50* 10.92*  CALCIUM 9.2 9.3  PHOS 4.6 4.9*   Liver Function Tests: Recent Labs  Lab 04/28/23 1037 05/03/23 0820  ALBUMIN 2.5* 2.4*   CBC: Recent Labs  Lab 04/28/23 1037 05/01/23 1322 05/03/23 0820  WBC 4.1 3.8* 3.8*  NEUTROABS 2.8  --   --   HGB 10.4* 9.5* 10.2*  HCT 33.8* 30.7* 33.7*  MCV 88.0 90.0 89.9  PLT 167 137* 139*   Medications:   (feeding supplement) PROSource Plus  30 mL Oral BID BM   amLODipine  5 mg Oral Daily   ARIPiprazole  10 mg Oral QHS   ascorbic acid  500 mg Oral Daily   aspirin EC  81 mg Oral Daily   atorvastatin  80 mg Oral Daily   Chlorhexidine Gluconate Cloth  6 each Topical Q0600   darbepoetin (ARANESP) injection - DIALYSIS   150 mcg Subcutaneous Q Sun-1800   divalproex  250 mg Oral QHS   feeding supplement  237 mL Oral BID WC   gabapentin  300 mg Oral Q T,Th,Sa-HD   Gerhardt's butt cream  1 Application Topical QID   mometasone-formoterol  2 puff Inhalation BID   pantoprazole  40 mg Oral BID   traZODone  50 mg Oral QHS    Dialysis Orders: Previously on [facility] thru [facility] - changed to HD - will need outpatient HD unit established prior to d/c. 4hr, 3K, EDW ~94, L AVF    Assessment/Plan:   Debility - in CIR. Appropriateness for CIR nearing its end - discharge date is 10/21. Encephalopathy/agitation: Waxing/waning agitation, dementia, has significant sundowning. No acute findings on imaging. Neuro and Psych consulting to help improve behavior. He didn't do well with HD on 10/9 and 10/12. Did not improve with wife at bedside previously, not a candidate for home dialysis at this time. Meds being adjusted per primary team/psychiatry team - much improved this week - agreeable/calm and dialyzing in recliner.  ESRD: Transitioned from PD to HD - TTS schedule. Using old AVF which  is still functional. TRYING TO DIALYZE AS EARLY AS POSSIBLE IN DAY IN RECLINER, this makes a huge difference because he gets significant sundowning - did tremendously better on 10/15.  Has been accepted back to [facility].  Hypertension/volume: BP ok - off midodrine, now on low dose amlodipine. + edema. Anemia: Continue Aranesp weekly. Last Tsat 38% with ferritin 2175. Hgb stable. Metabolic bone disease: CorrCa and phos in goal. No VDRA.  Nutrition:  Alb low, continue supps.  Renal diet w/fluid restrictions.  Hx NSVT: no meds at this time. Hx culture negative peritonitis and associated fungemia + enterococcus bacteremia - underwent PD cath removal, TDC placement and removal, temp HD cath placement  and removal, and line holiday. Completed IV unasyn, IV micafungin and cipro on 8/15. TDC not replaced cause he had functional  AVF Dispo - Previous issues with safety during HD making patient unable to discharge. He is not a candidate for LTAC per SW. Reviewed consideration for hospice - wife not interested in this. SEE ABOVE, MUCH BETTER ON 10/15. Will CLIP back to prior HD unit. Discharging home without dialysis plan is not a good option. Coming to ED for intermittent dialysis is typically reserved only for patients who have been kicked out of area centers. Given improvement in behavior, he appears stable for outpatient dialysis.  Has been accepted back to [facility].   Virgina Norfolk, PA-C [facility] 05/04/2023,11:53 AM  LOS: 53 days

## 2023-05-04 NOTE — Progress Notes (Signed)
Occupational Therapy Session Note  Patient Details  Name: [name] [name] MRN: [id number] Date of Birth: 03-22-1951  Today's Date: 05/04/2023 OT Individual Time: 1300-1330 OT Individual Time Calculation (min): 30 min    Short Term Goals: Week 7:  OT Short Term Goal 1 (Week 7): LTG=STG 2/2 ELOS  Skilled Therapeutic Interventions/Progress Updates:    Pt greeted seated in wc with spouse present for family education. Education provided regarding safe BADL performance in home environment, home modifications, DME needs, energy conservation techniques, and safety awareness.  Pt brought to tub room and practiced simulated home environment. Pt unable to step over the tub ledge like his wife wants him to be able to do. Pt was able to use the tub bench and slide over, then pull feet into tub, but spouse does not want to get one for fear of water getting on the floor. OT educated on shower curtain positioning to keep water in tub, but spouse reported she would rather just sponge bathe with pt. Pt then ambulated in hallway with spouse 3 sets of 40-50 feet w/ RW and rest breaks in between. Pt returned to room and left seated in wc with alarm belt on, spouse present, and needs met.    Therapy Documentation Precautions:  Precautions Precautions: Fall Precaution Comments: impulsive Restrictions Weight Bearing Restrictions: No  Denies pain   Therapy/Group: Individual Therapy  [name] 05/04/2023, 1:34 PM

## 2023-05-04 NOTE — Plan of Care (Signed)
CH[facility] Tonsillectomy/Adenoidectomy, Postoperative PEDS care plan entered in error.

## 2023-05-04 NOTE — Progress Notes (Signed)
PROGRESS NOTE   Subjective/Complaints  No issues ovenright. Dialysis stopped early yesterday after 2.5 hours without incident d/t patient getting anxious.  Patient states that he was upstairs for 5 hours prior to this, felt this was too long to wait and got anxious.  Otherwise, no acute complaints today.  No pain, eating well, sleeping well.  No concerns regarding upcoming discharge Monday.  Happy to go back to [facility] for dialysis, says this is a good center and he likes the people there.  LBM 10/15  ROS: Limited by cognition.  Denies fevers, chills, N/V, abdominal pain, constipation, diarrhea, SOB, cough, chest pain, joint pain, new weakness or paraesthesias.  Pertinent positives per HPI above.  Objective:   No results found. Recent Labs    05/01/23 1322 05/03/23 0820  WBC 3.8* 3.8*  HGB 9.5* 10.2*  HCT 30.7* 33.7*  PLT 137* 139*         Recent Labs    05/03/23 0820  NA 134*  K 4.0  CL 94*  CO2 22  GLUCOSE 145*  BUN 72*  CREATININE 10.92*  CALCIUM 9.3          Intake/Output Summary (Last 24 hours) at 05/04/2023 1015 Last data filed at 05/04/2023 0800 Gross per 24 hour  Intake 138 ml  Output 1400 ml  Net -1262 ml            Physical Exam: Vital Signs Blood pressure 123/78, pulse 82, temperature 98 F (36.7 C), resp. rate 18, height 5\' 4"  (1.626 m), weight 94.4 kg, SpO2 94%.   Constitutional: No distress . Vital signs reviewed.  Sitting up in bedside wheelchair. HEENT: NCAT, EOMI, oral membranes moist.  Bilateral eye puffiness ongoing, unchanged. Cardiovascular: RRR without murmur. No JVD .  Trace bilateral peripheral edema. Respiratory/Chest: CTA Bilaterally without wheezes or rales. Normal effort    GI/Abdomen: BS +, non-tender, non-distended Ext: no clubbing, cyanosis Psych: Cooperative,  pleasant, appropriate.  Does become somewhat agitated when discussing yesterday, but easily  redirectable. Skin: No evidence of breakdown, no evidence of rash.  Left upper extremity AV fistula.   Neurologic:  + Cognitive deficits - oriented x3 today, when using calendar for cueing +Lethargy, responsive -improving awakeness/alertness cranial nerves II through XII grossly intact, Moving all 4 extremities antigravity  and against resistance, 5-/5 throughout,  - + bilateral upper extremity tremors -mildly worse today, along with head tremor.  This waxes and wanes depending on his dialysis. Musculoskeletal: No obvious deformities.  Full active range of motion in all 4 extremities.   Assessment/Plan: 1. Functional deficits which require 15 hr per week of interdisciplinary therapy in a comprehensive inpatient rehab setting. Physiatrist is providing close team supervision and 24 hour management of active medical problems listed below. Physiatrist and rehab team continue to assess barriers to discharge/monitor patient progress toward functional and medical goals  Care Tool:  Bathing    Body parts bathed by patient: Right arm, Left arm, Chest, Abdomen, Front perineal area, Right upper leg, Buttocks, Left upper leg, Right lower leg, Left lower leg, Face   Body parts bathed by helper: Right arm, Left arm, Chest, Abdomen, Front perineal area, Buttocks, Right upper leg, Left  upper leg, Right lower leg, Left lower leg, Face     Bathing assist Assist Level: Moderate Assistance - Patient 50 - 74%     Upper Body Dressing/Undressing Upper body dressing   What is the patient wearing?: Pull over shirt    Upper body assist Assist Level: Minimal Assistance - Patient > 75%    Lower Body Dressing/Undressing      Lower body dressing      What is the patient wearing?: Underwear/pull up, Pants     Lower body assist Assist for lower body dressing: Moderate Assistance - Patient 50 - 74%     Toileting Toileting    Toileting assist Assist for toileting: Minimal Assistance - Patient >  75%     Transfers Chair/bed transfer  Transfers assist     Chair/bed transfer assist level: Contact Guard/Touching assist     Locomotion Ambulation   Ambulation assist      Assist level: Contact Guard/Touching assist Assistive device: Walker-rolling Max distance: 15'   Walk 10 feet activity   Assist     Assist level: Contact Guard/Touching assist Assistive device: Walker-platform   Walk 50 feet activity   Assist Walk 50 feet with 2 turns activity did not occur: Safety/medical concerns  Assist level: Minimal Assistance - Patient > 75% Assistive device: No Device    Walk 150 feet activity   Assist Walk 150 feet activity did not occur: Safety/medical concerns  Assist level: Minimal Assistance - Patient > 75% Assistive device: No Device    Walk 10 feet on uneven surface  activity   Assist Walk 10 feet on uneven surfaces activity did not occur: Safety/medical concerns   Assist level: Minimal Assistance - Patient > 75% Assistive device: Walker-rolling   Wheelchair     Assist Is the patient using a wheelchair?: Yes Type of Wheelchair: Manual    Wheelchair assist level: Dependent - Patient 0%      Wheelchair 50 feet with 2 turns activity    Assist        Assist Level: Dependent - Patient 0%   Wheelchair 150 feet activity     Assist      Assist Level: Dependent - Patient 0%   Blood pressure 123/78, pulse 82, temperature 98 F (36.7 C), resp. rate 18, height 5\' 4"  (1.626 m), weight 94.4 kg, SpO2 94%.  Medical Problem List and Plan: 1. Functional deficits secondary to metabolic encephalopathy due to sepsis secondary to catheter peritonitis and hypoxemia              -patient may not shower             -ELOS/Goals: 16-18 days, PT/OT Sup, SLP min A - DC goal 10/21, home with outpatient HD!   - Per wife, patient will need to be SPV/CGA level with rolling walker for return home.   - 10/11: Long discussion with wife, by 10/15 we  will need to dispo plan home with [facility] and ER for HD vs. to SNF with HD capabilities.  Currently, no options for home or OP dialysis.  Continuing to work on improving behaviors so that he may qualify for outpatient dialysis.  Not considering hospice/palliative at this time.  -10/15: Team thinks CGA with RW may be realistic given improvements over the last week. Will get family training this week. Plan for DC 10/21; hopeful will return to [facility] but if not will use ER for dialysis in the interim until they can establish. Offered SNF with Dialysis but  wife prefers this plan.   10-17: Accepted back to [facility] for outpatient HD; proceed with family training, plan for discharge 10/21             -Continue CIR therapies including PT, OT, and SLP   -[facility] -- encephalopathy/aggitation has developed since approximately 8/22, with decline earlier following COVID infection per wife   -Therapy QD schedule  - Con't CIR PT, OT and SLP  - Patient was seen by palliative care, changed to DNR status -Marcelino Duster with palliative continuing to follow  -Tried wife at bedside with dialysis on 10-3, patient remained agitated and pulling at lines, per nephrology not a candidate for outpatient hemodialysis.     10/6 vascular is not recommending conversion to PD.   10-7: Palliative signed off  2.  Antithrombotics: -DVT/anticoagulation:  Pharmaceutical: Heparin, 03/23/23 changed to Lovenox -  consistent refusal -  started on Eliquis 2.5 mg BID - Dced 9/12 per cardiology "With patient's chronic anemia and agitation/recent falls, appears that risk of OAC>benefits at this time... ". SCDs at bedtime added 10/4.              -antiplatelet therapy: aspirin and Plavix --> ASA alone per cardiology 9/11   3. Pain Management:              -Lidoderm patches to back, Tylenol every 4 hours as needed's, muscle rub as needed, and oxycodone 2.5 mg 3 times daily as needed  -Elavil 25 mg at bedtime for nerve pain and sleep  8/27 -weaned off 9-7 due to oversedation  - No use of PRNs or general complaints of pain since 9-29  4.  Lethargy/ intermittent agitation /insomnia: Persistent despite interventions  -Weaned off of veil bed, remains on telesitter for safety.  Appears somewhat better with wife at bedside and when seated at the station.             -Admitted on Seroquel 50 mg TID -weaned off             -clonazepam 0.5 mg TID prn -transition to lorazepam 0.5 mg daily p.o. or IV as needed for anxiety, agitation -10-4 psychiatry premedicating dialysis with1 mg ativan -Currently have Atarax first-line and Ativan 1 mg scheduled predialysis for agitation; redirect and avoid overstimulation as much as possible.   - Avoid increasing sedating medications as much as possible, as there is a direct effect on patient's sleep, confusion, and agitation up to a day after receiving PRN antipsychotics and Ativan.              - Dced Geodon 10 mg IM q 12 hours prn d/t  switched to Abilify per psychiatry 9-17             -8-28 added depakote 125 mg q8h for ongoing aggitation, mood stabilization -discontinued 9-2, resumed 9-21, increased 9-23 - level 18 subtherapeutic increased to 250 mg Q12h 10/4 -> psych changing to long-acting to 250 mg nightly 10-6; repeat levels 10-8 - <10  - 10-8: D/w psych adjusting vs. Dcing depakote; in agreement with plan, Depakote Dc'd 10/9 --> resumed 250 mg at bedtime per psych 10/14; report free levels likely much higher than plasma concentration and this dose may be adequite  - 9-17 Abilify started per psychiatry 5 mg -> 7.5 mg 9-27 -> 10 mg 10/11   - Will need OP labs, LFTs for monitorring  - 9-24 memantine 5 mg daily started per psychiatry, discontinued 10-3 per family request due to increasing  confusion and lethargy -Psychiatry following, appreciate their assistance and recommendations in managing this patient - signed off 10/16 - Responsive to low stimulating environment, music, and redirection.   Remains intermittently agitated, especially with dialysis and at nighttime, but is significantly improving over the weeks.   10/8: Poor sleep overnight, remains pleasant and cooperative today and appreciate overall slight improvement in behaviors this week. 10/9: Neurology noting pre-existing cognitive deficits documented on the outpatient side; while these are significantly worse, may be beneficial to reach out to other family members regarding his prior cognitive baseline.  Will also look into family bringing in CPAP. 10/12: CPAP ordered.  Psych reccommending possibly increasing abilify next week if tolerated. D/t no PRN use in some time, DC at bedtime scheduled trazadone to limit psychoactive medication burden.   10/13: Poor sleep, resume trazadone 50 mg at bedtime.  As seen with prior administrations, believe predialysis Ativan is causing significant lethargy and confusion up to a day after administration 10/15 - 18: remains impulsive overnights but no PRNs needed -behaviors do seem reduced with readdition of Depakote  5. Hypoactive delirium/metabolic encephalopathy/dementia with sundowning: This patient is not capable of making decisions on his own behalf.  Persistent despite significant efforts to workup/treat.  -  8/29: EEG negative, 8-30: MRI showing chronic microvascular changes, otherwise normal.  Multiple lab workups for encephalopathy with CMP, ammonia, CBC negative except for low VPA.   -  10-2: Lethargic, jerking , BG 70s, improved with POs.  Resume blood glucose check twice daily. Repeat VPA levels (18).  CMP CBC look okay.    -10/8: Neuro also to eval as above; appreciate their assistance and recommendations. -recommending discontinuation of Atarax and Ativan; will consider if he is able to tolerate transition to dialysis chair. Appreciate their assistance and recommendations.  Folate, B12, and TSH normal.   - 10/13: Will add 300 mg gabapentin to pre-dialysis, Ativan change to PRN 0.5 mg  during dialysis only to target behaviors.   - 10/15: During dialysis [facility] improved behaviors with earlier time and allowed to sit in chair; 10-17 continuing to do well, reaccepted to outpatient HD  10-18: Clarified with nursing yesterday for premedication with gabapentin, Ativan not to be given unless ongoing agitated behaviors.  Do feel this may have contributed to him getting more agitated/confused yesterday.    6. Skin/Wound Care: Routine skin care checks             -sacral skin off-loading/protection  DC IV 10/16   7. Fluids/Electrolytes/Nutrition: Strict Is and Os and follow-up chemistries per nephrology             - renal diet/ 1200 cc FR  - Renvela 800 mg 3 times daily for hyperphosphatemia added per nephrology 8-27   - Management per nephrology, labs appear approximately stable, p.o. intakes are appropriate at this time but wax and wane with lethargy   - 10/2-10: Mild hyponatremia 134-133 recently; is not worsening, has dipped into this range before, no apparent cognitive effects. Continue management with dialysis. 10/10 - resolved    8: Hypotension/Hypertension: monitor TID and prn             -continue midodrine as needed during dialysis  9-1: add midodrine 5 mg tid and monitor.  DC Coreg as heart rate has been low to normal; may resume at low-dose once blood pressure improved.  9/13 DC midodrine   - Intermittent hypertension into the 150s, is higher on off dialysis days.  Would maintain higher perfusion pressure  given microvascular changes seen on MRI.  No medication adjustments for now.  10-7 - 10/9 well-controlled, with intermittent transient elevations.  10/11: Add amlodipine 2.5 mg daily for consistent mild elevations--> normotensive 10/12-13  -10/17-> increase amlodipine to 5 mg daily; improved 10-18    05/04/2023    4:36 AM 05/03/2023    8:14 PM 05/03/2023    2:58 PM  Vitals with BMI  Systolic 123 121 409  Diastolic 78 70 111  Pulse 82 71 75     9: Hyperlipidemia:  continue statin   10: CAD s/p CABG 2023: on DAPT and statin.  - troponin elevated but stable, monitor 8/27   11: DM-2: A1c = 6.3% (at home on Lantus 22 units at bedtime, Tradjenta 5 mg daily)        -monitor PO intake             -continue SSI (0-6 units) does not appear to be requiring coverage   - 9/6 DC SSI, change CBGs to daily   - 10-2: Hypoglycemic today, 70s, improved with p.o. intakes, 88 at recheck.  Likely due to poor p.o. intakes last few days.  Start CBG checks twice daily  - 10-4: Patient agitated with CBG checks, refusing.  Moved to as needed for lethargy, AM  - Intermittent mild elevations on BMP but not warranting Tx currently (<200)     12: ESRD: HD on Tues, Thurs, Sat -nephrology made aware  -Regularly have to hold dialysis or stopped early due to agitated behaviors, which is ongoing.  Recently has been able to receive all or part of most dialysis sessions.  -Have tried premedicating patient and providing as needed medications with Haldol, Ativan as available on dialysis unit; measures helped temporarily but overall do not seem to affect treatment  -Wife at bedside for dialysis 10-3, did not improve behaviors, per nephrology patient is not a candidate for outpatient dialysis.     -10/5-6 see discussion above. Wife tries to help in HD. However, behaviors continue to make HD difficult.  -Unfortunately, vascular not offering conversion to PD.  Will discuss possible options with nephrology.    - 10/8:  Nephro to proved detailed notes on dialysis behaviors this week to target; goal to be OP HD candidate -agitated 10-10 and 10/12 session, continue to work on behaviors as above  10/15: trialling earlier dialysis session today - went very well! D/w nephology continuing earlier times to avoid aggitation (?sundowning) and allow full sessions. No PRN ativan or atarax needed with pre-gabapentin.  Again, successful session 10-17  13: COPD/OSA; chronic home 2L O2 (Symbicort, Ventolin,  prednisone 40 mg daily at home)             -continue symbicort  -Added home oxygen back 8-27  -Satting well on room air, oxygen not required   -10/11: Patient's wife reports home CPAP machine not usable; RT consult to resume nighttime CPAP   - not using   14: GERD: continue Protonix   15: Non-sustained VT/pAF/bradycardia: resolved. per cardiology, Dr. Algie Coffer: -continue carvedilol 6.25 mg BID; consider increasing to 12.5 mg BID before starting low dose amiodarone  -consider Eliquis and discontinue asa and Plavix in one week if no active GI bleeding and stable hemoglobin from 8/24 -hemoglobin has remained stable but very low around 7, no recurrent A-fib, monitor for now given agitation/fall 8-31 and consider initiating Eliquis when safe -Some intermittent recurrent bradycardia while at inpatient rehab, improved with discontinuation of Elavil and Geodon -9/11 EKG for bradycardia  episode today, 2nd degree A-V block mobitz type 1.  Continue to hold beta blocker,9/11 will restart eliquis at 2.5 mg BID dose. Will consult cardiology regarding need for ASA/plavix and coreg.  Dr. Elease Hashimoto does not appear convinced that he has A-fib 9/12 per cardiology, DAPT transition to daily aspirin 81 only and Eliquis was discontinued - Heart rate currently well-controlled, hypertension as above, continuing to hold Eliquis due to falls/poor safety awareness, starting SCDs as above   16: HFrEF: Daily weights fluctuating significantly d/t dialysis. Monitor for signs of overload. No current concerns. Weights / volume stable with dialysis.    17: Liver nodularity on CT scan: LFTs reported to be normal. Avoid hepatotoxic medications    -Abdominal ultrasound earlier this admission with minimal ascites  -Ammonia levels have remained normal on multiple checks   18: Epistaxis - resolved             -previously improved with flonase and saline spray   19: Morbid Obesity. Dietary counseling Body mass index is 35.72  kg/m.    20. Hematuria - resolved  9/22- required in/out to check U/A- having new hematuria- nursing to keep an eye- since also bleeding from penis a little- monitor   9/23 no wBCs  on UA, follow  urine culture-Staphylococcus hemolyticus-discussed with pharmacy and ID pharmacy.  Think this is more likely contaminant  9/25 discuss with nephrology about possible treatment or other evaluation options  Spoke with Dr. Ronalee Belts, most likely contaminant.  Recommended treating only if symptomatic.  If hematuria continues consider urology input  9/27 urine has improved to clear yellow   21.  Vitamin C deficiency.  Aggressive repletion per psychiatry, 100 mg IV x 6+ 500 mg p.o. daily.  Feel this may help his cognition.  -04-22-09: Am noting some slightly improved attention, memory on gross exam since IV course; continue PO supplementation  23.  Chest pain a.m. 10-3.  EKG unchanged, self resolved.  -control bp as possible  24. Bilateral ankle pain. Changing locations, no known trauma, no instability, edema, or point tenderness. ?Peripheral neuropathy related; may benefit from Qutenza but would not add sedating medications for tx at this time.   -Resolved  LOS: 53 days A FACE TO FACE EVALUATION WAS PERFORMED  Angelina Sheriff 05/04/2023, 10:15 AM

## 2023-05-05 LAB — CBC
HCT: 31.8 % — ABNORMAL LOW (ref 39.0–52.0)
Hemoglobin: 9.4 g/dL — ABNORMAL LOW (ref 13.0–17.0)
MCH: 26.5 pg (ref 26.0–34.0)
MCHC: 29.6 g/dL — ABNORMAL LOW (ref 30.0–36.0)
MCV: 89.6 fL (ref 80.0–100.0)
Platelets: 131 10*3/uL — ABNORMAL LOW (ref 150–400)
RBC: 3.55 MIL/uL — ABNORMAL LOW (ref 4.22–5.81)
RDW: 15.9 % — ABNORMAL HIGH (ref 11.5–15.5)
WBC: 4.1 10*3/uL (ref 4.0–10.5)
nRBC: 0 % (ref 0.0–0.2)

## 2023-05-05 LAB — RENAL FUNCTION PANEL
Albumin: 2.6 g/dL — ABNORMAL LOW (ref 3.5–5.0)
Anion gap: 12 (ref 5–15)
BUN: 69 mg/dL — ABNORMAL HIGH (ref 8–23)
CO2: 26 mmol/L (ref 22–32)
Calcium: 9.2 mg/dL (ref 8.9–10.3)
Chloride: 98 mmol/L (ref 98–111)
Creatinine, Ser: 11.05 mg/dL — ABNORMAL HIGH (ref 0.61–1.24)
GFR, Estimated: 4 mL/min — ABNORMAL LOW (ref >60)
Glucose, Bld: 155 mg/dL — ABNORMAL HIGH (ref 70–99)
Phosphorus: 4.1 mg/dL (ref 2.5–4.6)
Potassium: 4.7 mmol/L (ref 3.5–5.1)
Sodium: 136 mmol/L (ref 135–145)

## 2023-05-05 MED ORDER — HEPARIN SODIUM (PORCINE) 1000 UNIT/ML IJ SOLN
INTRAMUSCULAR | Status: AC
  Filled 2023-05-05: qty 4

## 2023-05-05 NOTE — Progress Notes (Signed)
   05/05/23 1300  Vitals  Temp 97.9 F (36.6 C)  Pulse Rate 72  Resp (!) 22  BP 130/79  SpO2 94 %  O2 Device Room Air  Weight  (pt in chair)  Post Treatment  Dialyzer Clearance Lightly streaked  Hemodialysis Intake (mL) 0 mL  Liters Processed 84  Fluid Removed (mL) 3000 mL  Tolerated HD Treatment Yes  AVG/AVF Arterial Site Held (minutes) 10 minutes  AVG/AVF Venous Site Held (minutes) 10 minutes   Received patient in bed to unit.  Alert and oriented.  Informed consent signed and in chart.   TX duration:3.5hrs  Patient tolerated well.  Transported back to the room  Alert, without acute distress.  Hand-off given to patient's nurse.   Access used: LUA AVF Access issues: none  Total UF removed: 3L Medication(s) given: none    [name]

## 2023-05-05 NOTE — Procedures (Signed)
Patient was seen on dialysis and the procedure was supervised.  BFR 400  Via AVF BP is  121/80.   Patient appears to be tolerating treatment well.  Calm and comfortable.   Myrikal Messmer Jaynie Collins 05/05/2023

## 2023-05-05 NOTE — Progress Notes (Signed)
PROGRESS NOTE   Subjective/Complaints  Sitting in the nurses station in his wheelchair.  Denies any pain.  No new concerns elicited.  Had dialysis today, note indicates he tolerated this well today.  LBM 10/15  ROS: Limited by cognition.  Denies fevers, chills, headache, N/V, abdominal pain, constipation, diarrhea, SOB, cough, chest pain, joint pain, new weakness or paraesthesias.  Pertinent positives per HPI above.  Objective:   No results found. Recent Labs    05/03/23 0820 05/05/23 0750  WBC 3.8* 4.1  HGB 10.2* 9.4*  HCT 33.7* 31.8*  PLT 139* 131*         Recent Labs    05/03/23 0820 05/05/23 0750  NA 134* 136  K 4.0 4.7  CL 94* 98  CO2 22 26  GLUCOSE 145* 155*  BUN 72* 69*  CREATININE 10.92* 11.05*  CALCIUM 9.3 9.2          Intake/Output Summary (Last 24 hours) at 05/05/2023 1503 Last data filed at 05/05/2023 1300 Gross per 24 hour  Intake 240 ml  Output 3000 ml  Net -2760 ml            Physical Exam: Vital Signs Blood pressure 119/66, pulse 72, temperature (!) 97.5 F (36.4 C), temperature source Oral, resp. rate 16, height 5\' 4"  (1.626 m), weight 94.4 kg, SpO2 100%.   Constitutional: No distress . Vital signs reviewed.  Sitting up in bedside wheelchair at the nurses station HEENT: NCAT, EOMI, oral membranes moist.  Bilateral eye puffiness ongoing, unchanged. Cardiovascular: RRR without murmur. No JVD .  No lower extremity edema noted  respiratory/Chest: CTA Bilaterally without wheezes or rales. Normal effort    GI/Abdomen: BS +, non-tender, non-distended Ext: no clubbing, cyanosis Psych: Cooperative,  pleasant, appropriate.  Does become somewhat agitated when discussing yesterday, but easily redirectable. Skin: No evidence of breakdown, no evidence of rash.  Left upper extremity AV fistula.   Neurologic:  + Cognitive deficits - oriented x3 today, when using calendar for  cueing +Lethargy, responsive -improving awakeness/alertness cranial nerves II through XII grossly intact, Moving all 4 extremities antigravity  and against resistance, 5-/5 throughout,  - + bilateral upper extremity tremors -mildly worse today, along with head tremor.  This waxes and wanes depending on his dialysis. Musculoskeletal: No obvious deformities.  Full active range of motion in all 4 extremities.   Assessment/Plan: 1. Functional deficits which require 15 hr per week of interdisciplinary therapy in a comprehensive inpatient rehab setting. Physiatrist is providing close team supervision and 24 hour management of active medical problems listed below. Physiatrist and rehab team continue to assess barriers to discharge/monitor patient progress toward functional and medical goals  Care Tool:  Bathing    Body parts bathed by patient: Right arm, Left arm, Chest, Abdomen, Front perineal area, Right upper leg, Buttocks, Left upper leg, Right lower leg, Left lower leg, Face   Body parts bathed by helper: Right arm, Left arm, Chest, Abdomen, Front perineal area, Buttocks, Right upper leg, Left upper leg, Right lower leg, Left lower leg, Face     Bathing assist Assist Level: Moderate Assistance - Patient 50 - 74%     Upper Body Dressing/Undressing  Upper body dressing   What is the patient wearing?: Pull over shirt    Upper body assist Assist Level: Minimal Assistance - Patient > 75%    Lower Body Dressing/Undressing      Lower body dressing      What is the patient wearing?: Underwear/pull up, Pants     Lower body assist Assist for lower body dressing: Moderate Assistance - Patient 50 - 74%     Toileting Toileting    Toileting assist Assist for toileting: Minimal Assistance - Patient > 75%     Transfers Chair/bed transfer  Transfers assist     Chair/bed transfer assist level: Contact Guard/Touching assist     Locomotion Ambulation   Ambulation assist       Assist level: Contact Guard/Touching assist Assistive device: Walker-rolling Max distance: 15'   Walk 10 feet activity   Assist     Assist level: Contact Guard/Touching assist Assistive device: Walker-platform   Walk 50 feet activity   Assist Walk 50 feet with 2 turns activity did not occur: Safety/medical concerns  Assist level: Minimal Assistance - Patient > 75% Assistive device: No Device    Walk 150 feet activity   Assist Walk 150 feet activity did not occur: Safety/medical concerns  Assist level: Minimal Assistance - Patient > 75% Assistive device: No Device    Walk 10 feet on uneven surface  activity   Assist Walk 10 feet on uneven surfaces activity did not occur: Safety/medical concerns   Assist level: Minimal Assistance - Patient > 75% Assistive device: Walker-rolling   Wheelchair     Assist Is the patient using a wheelchair?: Yes Type of Wheelchair: Manual    Wheelchair assist level: Dependent - Patient 0%      Wheelchair 50 feet with 2 turns activity    Assist        Assist Level: Dependent - Patient 0%   Wheelchair 150 feet activity     Assist      Assist Level: Dependent - Patient 0%   Blood pressure 119/66, pulse 72, temperature (!) 97.5 F (36.4 C), temperature source Oral, resp. rate 16, height 5\' 4"  (1.626 m), weight 94.4 kg, SpO2 100%.  Medical Problem List and Plan: 1. Functional deficits secondary to metabolic encephalopathy due to sepsis secondary to catheter peritonitis and hypoxemia              -patient may not shower             -ELOS/Goals: 16-18 days, PT/OT Sup, SLP min A - DC goal 10/21, home with outpatient HD!   - Per wife, patient will need to be SPV/CGA level with rolling walker for return home.   - 10/11: Long discussion with wife, by 10/15 we will need to dispo plan home with [facility] and ER for HD vs. to SNF with HD capabilities.  Currently, no options for home or OP dialysis.  Continuing to work on  improving behaviors so that he may qualify for outpatient dialysis.  Not considering hospice/palliative at this time.  -10/15: Team thinks CGA with RW may be realistic given improvements over the last week. Will get family training this week. Plan for DC 10/21; hopeful will return to [facility] but if not will use ER for dialysis in the interim until they can establish. Offered SNF with Dialysis but wife prefers this plan.   10-17: Accepted back to [facility] for outpatient HD; proceed with family training, plan for discharge 10/21             -  Continue CIR therapies including PT, OT, and SLP   -[facility] -- encephalopathy/aggitation has developed since approximately 8/22, with decline earlier following COVID infection per wife   -Therapy QD schedule  - Con't CIR PT, OT and SLP  - Patient was seen by palliative care, changed to DNR status -Marcelino Duster with palliative continuing to follow  -Tried wife at bedside with dialysis on 10-3, patient remained agitated and pulling at lines, per nephrology not a candidate for outpatient hemodialysis.     10/6 vascular is not recommending conversion to PD.   10-7: Palliative signed off  2.  Antithrombotics: -DVT/anticoagulation:  Pharmaceutical: Heparin, 03/23/23 changed to Lovenox -  consistent refusal -  started on Eliquis 2.5 mg BID - Dced 9/12 per cardiology "With patient's chronic anemia and agitation/recent falls, appears that risk of OAC>benefits at this time... ". SCDs at bedtime added 10/4.              -antiplatelet therapy: aspirin and Plavix --> ASA alone per cardiology 9/11   3. Pain Management:              -Lidoderm patches to back, Tylenol every 4 hours as needed's, muscle rub as needed, and oxycodone 2.5 mg 3 times daily as needed  -Elavil 25 mg at bedtime for nerve pain and sleep 8/27 -weaned off 9-7 due to oversedation  - No use of PRNs or general complaints of pain since 9-29  4.  Lethargy/ intermittent agitation /insomnia:  Persistent despite interventions  -Weaned off of veil bed, remains on telesitter for safety.  Appears somewhat better with wife at bedside and when seated at the station.             -Admitted on Seroquel 50 mg TID -weaned off             -clonazepam 0.5 mg TID prn -transition to lorazepam 0.5 mg daily p.o. or IV as needed for anxiety, agitation -10-4 psychiatry premedicating dialysis with1 mg ativan -Currently have Atarax first-line and Ativan 1 mg scheduled predialysis for agitation; redirect and avoid overstimulation as much as possible.   - Avoid increasing sedating medications as much as possible, as there is a direct effect on patient's sleep, confusion, and agitation up to a day after receiving PRN antipsychotics and Ativan.              - Dced Geodon 10 mg IM q 12 hours prn d/t  switched to Abilify per psychiatry 9-17             -8-28 added depakote 125 mg q8h for ongoing aggitation, mood stabilization -discontinued 9-2, resumed 9-21, increased 9-23 - level 18 subtherapeutic increased to 250 mg Q12h 10/4 -> psych changing to long-acting to 250 mg nightly 10-6; repeat levels 10-8 - <10  - 10-8: D/w psych adjusting vs. Dcing depakote; in agreement with plan, Depakote Dc'd 10/9 --> resumed 250 mg at bedtime per psych 10/14; report free levels likely much higher than plasma concentration and this dose may be adequite  - 9-17 Abilify started per psychiatry 5 mg -> 7.5 mg 9-27 -> 10 mg 10/11   - Will need OP labs, LFTs for monitorring  - 9-24 memantine 5 mg daily started per psychiatry, discontinued 10-3 per family request due to increasing confusion and lethargy -Psychiatry following, appreciate their assistance and recommendations in managing this patient - signed off 10/16 - Responsive to low stimulating environment, music, and redirection.  Remains intermittently agitated, especially with dialysis  and at nighttime, but is significantly improving over the weeks.   10/8: Poor sleep overnight,  remains pleasant and cooperative today and appreciate overall slight improvement in behaviors this week. 10/9: Neurology noting pre-existing cognitive deficits documented on the outpatient side; while these are significantly worse, may be beneficial to reach out to other family members regarding his prior cognitive baseline.  Will also look into family bringing in CPAP. 10/12: CPAP ordered.  Psych reccommending possibly increasing abilify next week if tolerated. D/t no PRN use in some time, DC at bedtime scheduled trazadone to limit psychoactive medication burden.   10/13: Poor sleep, resume trazadone 50 mg at bedtime.  As seen with prior administrations, believe predialysis Ativan is causing significant lethargy and confusion up to a day after administration 10/15 - 18: remains impulsive overnights but no PRNs needed -behaviors do seem reduced with readdition of Depakote  5. Hypoactive delirium/metabolic encephalopathy/dementia with sundowning: This patient is not capable of making decisions on his own behalf.  Persistent despite significant efforts to workup/treat.  -  8/29: EEG negative, 8-30: MRI showing chronic microvascular changes, otherwise normal.  Multiple lab workups for encephalopathy with CMP, ammonia, CBC negative except for low VPA.   -  10-2: Lethargic, jerking , BG 70s, improved with POs.  Resume blood glucose check twice daily. Repeat VPA levels (18).  CMP CBC look okay.    -10/8: Neuro also to eval as above; appreciate their assistance and recommendations. -recommending discontinuation of Atarax and Ativan; will consider if he is able to tolerate transition to dialysis chair. Appreciate their assistance and recommendations.  Folate, B12, and TSH normal.   - 10/13: Will add 300 mg gabapentin to pre-dialysis, Ativan change to PRN 0.5 mg during dialysis only to target behaviors.   - 10/15: During dialysis [facility] improved behaviors with earlier time and allowed to sit in chair; 10-17  continuing to do well, reaccepted to outpatient HD  10-18: Clarified with nursing yesterday for premedication with gabapentin, Ativan not to be given unless ongoing agitated behaviors.  Do feel this may have contributed to him getting more agitated/confused yesterday.   10/19 no behavioral issues noted in nursing notes   6. Skin/Wound Care: Routine skin care checks             -sacral skin off-loading/protection  DC IV 10/16   7. Fluids/Electrolytes/Nutrition: Strict Is and Os and follow-up chemistries per nephrology             - renal diet/ 1200 cc FR  - Renvela 800 mg 3 times daily for hyperphosphatemia added per nephrology 8-27   - Management per nephrology, labs appear approximately stable, p.o. intakes are appropriate at this time but wax and wane with lethargy   - 10/2-10: Mild hyponatremia 134-133 recently; is not worsening, has dipped into this range before, no apparent cognitive effects. Continue management with dialysis. 10/10 - resolved    8: Hypotension/Hypertension: monitor TID and prn             -continue midodrine as needed during dialysis  9-1: add midodrine 5 mg tid and monitor.  DC Coreg as heart rate has been low to normal; may resume at low-dose once blood pressure improved.  9/13 DC midodrine   - Intermittent hypertension into the 150s, is higher on off dialysis days.  Would maintain higher perfusion pressure given microvascular changes seen on MRI.  No medication adjustments for now.  10-7 - 10/9 well-controlled, with intermittent transient elevations.  10/11: Add amlodipine  2.5 mg daily for consistent mild elevations--> normotensive 10/12-13  -10/17-> increase amlodipine to 5 mg daily; improved 10-18  -10/19 BP well-controlled    05/05/2023    1:33 PM 05/05/2023    1:00 PM 05/05/2023   12:45 PM  Vitals with BMI  Weight  --   Systolic 119 130 956  Diastolic 66 79 93  Pulse  72 71     9: Hyperlipidemia: continue statin   10: CAD s/p CABG 2023: on DAPT and  statin.  - troponin elevated but stable, monitor 8/27   11: DM-2: A1c = 6.3% (at home on Lantus 22 units at bedtime, Tradjenta 5 mg daily)        -monitor PO intake             -continue SSI (0-6 units) does not appear to be requiring coverage   - 9/6 DC SSI, change CBGs to daily   - 10-2: Hypoglycemic today, 70s, improved with p.o. intakes, 88 at recheck.  Likely due to poor p.o. intakes last few days.  Start CBG checks twice daily  - 10-4: Patient agitated with CBG checks, refusing.  Moved to as needed for lethargy, AM  - Intermittent mild elevations on BMP but not warranting Tx currently (<200)     12: ESRD: HD on Tues, Thurs, Sat -nephrology made aware  -Regularly have to hold dialysis or stopped early due to agitated behaviors, which is ongoing.  Recently has been able to receive all or part of most dialysis sessions.  -Have tried premedicating patient and providing as needed medications with Haldol, Ativan as available on dialysis unit; measures helped temporarily but overall do not seem to affect treatment  -Wife at bedside for dialysis 10-3, did not improve behaviors, per nephrology patient is not a candidate for outpatient dialysis.     -10/5-6 see discussion above. Wife tries to help in HD. However, behaviors continue to make HD difficult.  -Unfortunately, vascular not offering conversion to PD.  Will discuss possible options with nephrology.    - 10/8:  Nephro to proved detailed notes on dialysis behaviors this week to target; goal to be OP HD candidate -agitated 10-10 and 10/12 session, continue to work on behaviors as above  10/15: trialling earlier dialysis session today - went very well! D/w nephology continuing earlier times to avoid aggitation (?sundowning) and allow full sessions. No PRN ativan or atarax needed with pre-gabapentin.  Again, successful session 10-17  10/19 reviewed nephrology notes, appears to be tolerating a.m. dialysis much better 13: COPD/OSA; chronic home 2L  O2 (Symbicort, Ventolin, prednisone 40 mg daily at home)             -continue symbicort  -Added home oxygen back 8-27  -Satting well on room air, oxygen not required   -10/11: Patient's wife reports home CPAP machine not usable; RT consult to resume nighttime CPAP   - not using   14: GERD: continue Protonix   15: Non-sustained VT/pAF/bradycardia: resolved. per cardiology, Dr. Algie Coffer: -continue carvedilol 6.25 mg BID; consider increasing to 12.5 mg BID before starting low dose amiodarone  -consider Eliquis and discontinue asa and Plavix in one week if no active GI bleeding and stable hemoglobin from 8/24 -hemoglobin has remained stable but very low around 7, no recurrent A-fib, monitor for now given agitation/fall 8-31 and consider initiating Eliquis when safe -Some intermittent recurrent bradycardia while at inpatient rehab, improved with discontinuation of Elavil and Geodon -9/11 EKG for bradycardia episode today, 2nd degree A-V  block mobitz type 1.  Continue to hold beta blocker,9/11 will restart eliquis at 2.5 mg BID dose. Will consult cardiology regarding need for ASA/plavix and coreg.  Dr. Elease Hashimoto does not appear convinced that he has A-fib 9/12 per cardiology, DAPT transition to daily aspirin 81 only and Eliquis was discontinued - Heart rate currently well-controlled, hypertension as above, continuing to hold Eliquis due to falls/poor safety awareness, starting SCDs as above -10/19 intermittent bradycardia however heart rate stable overall   16: HFrEF: Daily weights fluctuating significantly d/t dialysis. Monitor for signs of overload. No current concerns. Weights / volume stable with dialysis.    17: Liver nodularity on CT scan: LFTs reported to be normal. Avoid hepatotoxic medications    -Abdominal ultrasound earlier this admission with minimal ascites  -Ammonia levels have remained normal on multiple checks   18: Epistaxis - resolved             -previously improved with flonase  and saline spray   19: Morbid Obesity. Dietary counseling Body mass index is 35.72 kg/m.    20. Hematuria - resolved  9/22- required in/out to check U/A- having new hematuria- nursing to keep an eye- since also bleeding from penis a little- monitor   9/23 no wBCs  on UA, follow  urine culture-Staphylococcus hemolyticus-discussed with pharmacy and ID pharmacy.  Think this is more likely contaminant  9/25 discuss with nephrology about possible treatment or other evaluation options  Spoke with Dr. Ronalee Belts, most likely contaminant.  Recommended treating only if symptomatic.  If hematuria continues consider urology input  9/27 urine has improved to clear yellow   21.  Vitamin C deficiency.  Aggressive repletion per psychiatry, 100 mg IV x 6+ 500 mg p.o. daily.  Feel this may help his cognition.  -04-22-09: Am noting some slightly improved attention, memory on gross exam since IV course; continue PO supplementation  23.  Chest pain a.m. 10-3.  EKG unchanged, self resolved.  -control bp as possible  24. Bilateral ankle pain. Changing locations, no known trauma, no instability, edema, or point tenderness. ?Peripheral neuropathy related; may benefit from Qutenza but would not add sedating medications for tx at this time.   -Resolved  LOS: 54 days A FACE TO FACE EVALUATION WAS PERFORMED  Fanny Dance 05/05/2023, 3:03 PM

## 2023-05-06 NOTE — Progress Notes (Signed)
[facility] Progress Note   Subjective:   Patient seen and examined at bedside.  Eating breakfast.  Feeling well today. Tolerated HD well yesterday in chair with no behavior issues.  Denies CP, SOB, abdominal pain and n/v/d.   Objective Vitals:   05/05/23 1300 05/05/23 1333 05/05/23 2014 05/06/23 0700  BP: 130/79 119/66 129/81 138/74  Pulse: 72  87 71  Resp: (!) 22 16 17    Temp: 97.9 F (36.6 C) (!) 97.5 F (36.4 C)  97.8 F (36.6 C)  TempSrc:  Oral  Oral  SpO2: 94% 100% 98% 100%  Height:       Physical Exam General:chronically ill appearing male in NAD Heart:RRR, no mrg Lungs:faint crackles in bases, nml WOB on RA Abdomen:soft, NTND Extremities:trace LE edema Dialysis Access: LU AVF +b/t   Filed Weights    Intake/Output Summary (Last 24 hours) at 05/06/2023 0845 Last data filed at 05/05/2023 2215 Gross per 24 hour  Intake 480 ml  Output 3000 ml  Net -2520 ml    Additional Objective Labs: Basic Metabolic Panel: Recent Labs  Lab 05/03/23 0820 05/05/23 0750  NA 134* 136  K 4.0 4.7  CL 94* 98  CO2 22 26  GLUCOSE 145* 155*  BUN 72* 69*  CREATININE 10.92* 11.05*  CALCIUM 9.3 9.2  PHOS 4.9* 4.1   Liver Function Tests: Recent Labs  Lab 05/03/23 0820 05/05/23 0750  ALBUMIN 2.4* 2.6*   CBC: Recent Labs  Lab 05/01/23 1322 05/03/23 0820 05/05/23 0750  WBC 3.8* 3.8* 4.1  HGB 9.5* 10.2* 9.4*  HCT 30.7* 33.7* 31.8*  MCV 90.0 89.9 89.6  PLT 137* 139* 131*     Medications:   amLODipine  5 mg Oral Daily   ARIPiprazole  10 mg Oral QHS   ascorbic acid  500 mg Oral Daily   aspirin EC  81 mg Oral Daily   atorvastatin  80 mg Oral Daily   Chlorhexidine Gluconate Cloth  6 each Topical Q0600   darbepoetin (ARANESP) injection - DIALYSIS  150 mcg Subcutaneous Q Sun-1800   divalproex  250 mg Oral QHS   feeding supplement  237 mL Oral BID WC   gabapentin  300 mg Oral Q T,Th,Sa-HD   Gerhardt's butt cream  1 Application Topical QID    mometasone-formoterol  2 puff Inhalation BID   pantoprazole  40 mg Oral BID   traZODone  50 mg Oral QHS    Dialysis Orders: Previously on CCPD thru [facility] - changed to HD . 4hr, 3K, EDW ~94, L AVF  Accepted back for in center HD   Assessment/Plan:   Debility - in CIR. Appropriateness for CIR nearing its end - discharge date is 10/21. Encephalopathy/agitation: Waxing/waning agitation, dementia, has significant sundowning. No acute findings on imaging. Neuro and Psych consulting to help improve behavior. He didn't do well with HD on 10/9 and 10/12. Did not improve with wife at bedside previously, not a candidate for home dialysis at this time. Meds being adjusted per primary team/psychiatry team - much improved this week - agreeable/calm and dialyzing in recliner.  ESRD: Transitioned from PD to HD - TTS schedule. Using old AVF which is still functional. TRYING TO DIALYZE AS EARLY AS POSSIBLE IN DAY IN RECLINER, this makes a huge difference because he gets significant sundowning - did tremendously better on 10/15 and 10/19.  Has been accepted back to [facility].  Hypertension/volume: BP ok - off midodrine, now on low dose amlodipine. + edema, UF as  tolerated. Anemia: Continue Aranesp weekly. Last Tsat 38% with ferritin 2175. Hgb stable 9-10s. Metabolic bone disease: CorrCa and phos in goal. No VDRA.  Nutrition:  Alb low, continue supps.  Renal diet w/fluid restrictions.  Hx NSVT: no meds at this time. Hx culture negative peritonitis and associated fungemia + enterococcus bacteremia - underwent PD cath removal, TDC placement and removal, temp HD cath placement  and removal, and line holiday. Completed IV unasyn, IV micafungin and cipro on 8/15. TDC not replaced cause he had functional AVF Dispo - Previous issues with safety during HD making patient unable to discharge. He is not a candidate for LTAC per SW. Reviewed consideration for hospice - wife not interested in this. SEE  ABOVE, MUCH BETTER ON 10/15 and 10/19.  Given improvement in behavior, he appears stable for outpatient dialysis.  Has been accepted back to [facility] on TTS schedule.  Virgina Norfolk, PA-C [facility] 05/06/2023,8:45 AM  LOS: 55 days

## 2023-05-06 NOTE — Progress Notes (Signed)
Occupational Therapy Session Note  Patient Details  Name: [name] [name] MRN: [id number] Date of Birth: 05/19/1951  Today's Date: 05/06/2023 OT Individual Time: 0900-1000 OT Individual Time Calculation (min): 60 min    Short Term Goals: Week 1:  OT Short Term Goal 1 (Week 1): Pt will will complete 1 step of UB dressing task OT Short Term Goal 1 - Progress (Week 1): Met OT Short Term Goal 2 (Week 1): Patient will stand with min A in preparation for BADL task OT Short Term Goal 2 - Progress (Week 1): Met OT Short Term Goal 3 (Week 1): Pt will wash two body parts with min cues OT Short Term Goal 3 - Progress (Week 1): Met  Skilled Therapeutic Interventions/Progress Updates:    Patient seated at w/c LOF at the time of arrival. Patient indicated that he didn't rest well night and he had a sinus head ache 5 on a 0-10 scale. Patient didn't request medication. Patient in agreement with working on UB exercise and activity tolerance. The pt was transported to the gym and was able to complete the SciFit for 15 Min in duration with frequent rest break.Patient indicated that the cleaning solution smell in the gym was irritating  his sinuses so he was transported back to his room to complete treatment. The pt was able to complete shld flex, horizontal abduction, lg circles,  and  shld rotation using a 2lb dowel 1 set of 15 with rest break as needed, the pt required 1 rest break with each exercise. The pt went on to complete elbow extension 1 set of 15 using a 3lb dumb bell with rest breaks as needed. The pt  took 1 rest break.  The pt indicated that he needed to go to the restroom , he was able to push himself to the restroom with his feet supported on the footrest with close S. The pt was MaxA for going over the threshold. He was able to come from sit to stand with close S  using the grab bars and was able to doff his pants with close S. The pt was able to complete toileting hygiene and donning her pants with  close S. The pt was returned to his living quarters and was able to wash his hands at sink LOF with close S. The pt was remained at w/c LOF with his call light and bedside table within reach all additional needs were addressed prior to me exiting the room.  Therapy Documentation Precautions:  Precautions Precautions: Fall Precaution Comments: impulsive Restrictions Weight Bearing Restrictions: No Therapy/Group: Individual Therapy  Lavona Mound 05/06/2023, 12:54 PM

## 2023-05-06 NOTE — Progress Notes (Signed)
PROGRESS NOTE   Subjective/Complaints  No new concerns or complaints this morning.  No acute events overnight noted.  Last BM 10/19.  Noted to tolerate HD well yesterday.   ROS: Limited by cognition.  Denies fevers, chills,  N/V, abdominal pain, constipation, diarrhea, SOB, cough,chest pain, joint pain, new weakness or paraesthesias, new vision changes,  Pertinent positives per HPI above.  Objective:   No results found. Recent Labs    05/05/23 0750  WBC 4.1  HGB 9.4*  HCT 31.8*  PLT 131*         Recent Labs    05/05/23 0750  NA 136  K 4.7  CL 98  CO2 26  GLUCOSE 155*  BUN 69*  CREATININE 11.05*  CALCIUM 9.2          Intake/Output Summary (Last 24 hours) at 05/06/2023 1608 Last data filed at 05/05/2023 2215 Gross per 24 hour  Intake 240 ml  Output --  Net 240 ml            Physical Exam: Vital Signs Blood pressure 138/74, pulse 71, temperature 97.8 F (36.6 C), temperature source Oral, resp. rate 17, height 5\' 4"  (1.626 m), weight 94.4 kg, SpO2 100%.   Constitutional: No distress . Vital signs reviewed.  Sitting up in bedside wheelchair at the nurses station HEENT: NCAT, EOMI, oral membranes moist.  Bilateral eye puffiness ongoing, unchanged. Cardiovascular: RRR without murmur. No JVD .  No lower extremity edema noted  respiratory/Chest: CTA Bilaterally without wheezes or rales. Normal effort    GI/Abdomen: BS +, non-tender, non-distended Ext: no clubbing, cyanosis Psych: Cooperative,  pleasant, appropriate.  Calm and pleasant this morning. Skin: No evidence of breakdown, no evidence of rash.  Left upper extremity AV fistula.   Neurologic:  + Cognitive deficits - oriented x3 today, when using calendar for cueing +Lethargy, responsive -improving awakeness/alertness cranial nerves II through XII grossly intact, Moving all 4 extremities antigravity  and against resistance, 5-/5  throughout,  - + bilateral upper extremity tremors -mildly worse today, along with head tremor.  This waxes and wanes depending on his dialysis. Musculoskeletal: No obvious deformities.  Full active range of motion in all 4 extremities.   Assessment/Plan: 1. Functional deficits which require 15 hr per week of interdisciplinary therapy in a comprehensive inpatient rehab setting. Physiatrist is providing close team supervision and 24 hour management of active medical problems listed below. Physiatrist and rehab team continue to assess barriers to discharge/monitor patient progress toward functional and medical goals  Care Tool:  Bathing    Body parts bathed by patient: Right arm, Left arm, Chest, Abdomen, Front perineal area, Right upper leg, Buttocks, Left upper leg, Right lower leg, Left lower leg, Face   Body parts bathed by helper: Right arm, Left arm, Chest, Abdomen, Front perineal area, Buttocks, Right upper leg, Left upper leg, Right lower leg, Left lower leg, Face     Bathing assist Assist Level: Moderate Assistance - Patient 50 - 74%     Upper Body Dressing/Undressing Upper body dressing   What is the patient wearing?: Pull over shirt    Upper body assist Assist Level: Minimal Assistance - Patient > 75%  Lower Body Dressing/Undressing      Lower body dressing      What is the patient wearing?: Underwear/pull up, Pants     Lower body assist Assist for lower body dressing: Moderate Assistance - Patient 50 - 74%     Toileting Toileting    Toileting assist Assist for toileting: Minimal Assistance - Patient > 75%     Transfers Chair/bed transfer  Transfers assist     Chair/bed transfer assist level: Contact Guard/Touching assist     Locomotion Ambulation   Ambulation assist      Assist level: Contact Guard/Touching assist Assistive device: Walker-rolling Max distance: 150   Walk 10 feet activity   Assist     Assist level: Contact  Guard/Touching assist Assistive device: Walker-rolling   Walk 50 feet activity   Assist Walk 50 feet with 2 turns activity did not occur: Safety/medical concerns  Assist level: Contact Guard/Touching assist Assistive device: Walker-rolling    Walk 150 feet activity   Assist Walk 150 feet activity did not occur: Safety/medical concerns  Assist level: Contact Guard/Touching assist Assistive device: Walker-rolling    Walk 10 feet on uneven surface  activity   Assist Walk 10 feet on uneven surfaces activity did not occur: Safety/medical concerns   Assist level: Minimal Assistance - Patient > 75% Assistive device: Walker-rolling   Wheelchair     Assist Is the patient using a wheelchair?: Yes Type of Wheelchair: Manual    Wheelchair assist level: Supervision/Verbal cueing Max wheelchair distance: 50    Wheelchair 50 feet with 2 turns activity    Assist        Assist Level: Supervision/Verbal cueing   Wheelchair 150 feet activity     Assist      Assist Level: Dependent - Patient 0%   Blood pressure 138/74, pulse 71, temperature 97.8 F (36.6 C), temperature source Oral, resp. rate 17, height 5\' 4"  (1.626 m), weight 94.4 kg, SpO2 100%.  Medical Problem List and Plan: 1. Functional deficits secondary to metabolic encephalopathy due to sepsis secondary to catheter peritonitis and hypoxemia              -patient may not shower             -ELOS/Goals: 16-18 days, PT/OT Sup, SLP min A - DC goal 10/21, home with outpatient HD!   - Per wife, patient will need to be SPV/CGA level with rolling walker for return home.   - 10/11: Long discussion with wife, by 10/15 we will need to dispo plan home with [facility] and ER for HD vs. to SNF with HD capabilities.  Currently, no options for home or OP dialysis.  Continuing to work on improving behaviors so that he may qualify for outpatient dialysis.  Not considering hospice/palliative at this time.  -10/15: Team thinks CGA  with RW may be realistic given improvements over the last week. Will get family training this week. Plan for DC 10/21; hopeful will return to [facility] but if not will use ER for dialysis in the interim until they can establish. Offered SNF with Dialysis but wife prefers this plan.   10-17: Accepted back to [facility] for outpatient HD; proceed with family training, plan for discharge 10/21             -Continue CIR therapies including PT, OT, and SLP   -[facility] -- encephalopathy/aggitation has developed since approximately 8/22, with decline earlier following COVID infection per wife   -Therapy QD  schedule  - Con't CIR PT, OT and SLP  - Patient was seen by palliative care, changed to DNR status -Marcelino Duster with palliative continuing to follow  -Tried wife at bedside with dialysis on 10-3, patient remained agitated and pulling at lines, per nephrology not a candidate for outpatient hemodialysis.     10/6 vascular is not recommending conversion to PD.   10-7: Palliative signed off  2.  Antithrombotics: -DVT/anticoagulation:  Pharmaceutical: Heparin, 03/23/23 changed to Lovenox -  consistent refusal -  started on Eliquis 2.5 mg BID - Dced 9/12 per cardiology "With patient's chronic anemia and agitation/recent falls, appears that risk of OAC>benefits at this time... ". SCDs at bedtime added 10/4.              -antiplatelet therapy: aspirin and Plavix --> ASA alone per cardiology 9/11   3. Pain Management:              -Lidoderm patches to back, Tylenol every 4 hours as needed's, muscle rub as needed, and oxycodone 2.5 mg 3 times daily as needed  -Elavil 25 mg at bedtime for nerve pain and sleep 8/27 -weaned off 9-7 due to oversedation  - No use of PRNs or general complaints of pain since 9-29  4.  Lethargy/ intermittent agitation /insomnia: Persistent despite interventions  -Weaned off of veil bed, remains on telesitter for safety.  Appears somewhat better with wife at bedside and  when seated at the station.             -Admitted on Seroquel 50 mg TID -weaned off             -clonazepam 0.5 mg TID prn -transition to lorazepam 0.5 mg daily p.o. or IV as needed for anxiety, agitation -10-4 psychiatry premedicating dialysis with1 mg ativan -Currently have Atarax first-line and Ativan 1 mg scheduled predialysis for agitation; redirect and avoid overstimulation as much as possible.   - Avoid increasing sedating medications as much as possible, as there is a direct effect on patient's sleep, confusion, and agitation up to a day after receiving PRN antipsychotics and Ativan.              - Dced Geodon 10 mg IM q 12 hours prn d/t  switched to Abilify per psychiatry 9-17             -8-28 added depakote 125 mg q8h for ongoing aggitation, mood stabilization -discontinued 9-2, resumed 9-21, increased 9-23 - level 18 subtherapeutic increased to 250 mg Q12h 10/4 -> psych changing to long-acting to 250 mg nightly 10-6; repeat levels 10-8 - <10  - 10-8: D/w psych adjusting vs. Dcing depakote; in agreement with plan, Depakote Dc'd 10/9 --> resumed 250 mg at bedtime per psych 10/14; report free levels likely much higher than plasma concentration and this dose may be adequite  - 9-17 Abilify started per psychiatry 5 mg -> 7.5 mg 9-27 -> 10 mg 10/11   - Will need OP labs, LFTs for monitorring  - 9-24 memantine 5 mg daily started per psychiatry, discontinued 10-3 per family request due to increasing confusion and lethargy -Psychiatry following, appreciate their assistance and recommendations in managing this patient - signed off 10/16 - Responsive to low stimulating environment, music, and redirection.  Remains intermittently agitated, especially with dialysis and at nighttime, but is significantly improving over the weeks.   10/8: Poor sleep overnight, remains pleasant and cooperative today and appreciate overall slight improvement in behaviors this week. 10/9: Neurology noting pre-existing  cognitive deficits documented on the outpatient side; while these are significantly worse, may be beneficial to reach out to other family members regarding his prior cognitive baseline.  Will also look into family bringing in CPAP. 10/12: CPAP ordered.  Psych reccommending possibly increasing abilify next week if tolerated. D/t no PRN use in some time, DC at bedtime scheduled trazadone to limit psychoactive medication burden.   10/13: Poor sleep, resume trazadone 50 mg at bedtime.  As seen with prior administrations, believe predialysis Ativan is causing significant lethargy and confusion up to a day after administration 10/15 - 18: remains impulsive overnights but no PRNs needed -behaviors do seem reduced with readdition of Depakote  5. Hypoactive delirium/metabolic encephalopathy/dementia with sundowning: This patient is not capable of making decisions on his own behalf.  Persistent despite significant efforts to workup/treat.  -  8/29: EEG negative, 8-30: MRI showing chronic microvascular changes, otherwise normal.  Multiple lab workups for encephalopathy with CMP, ammonia, CBC negative except for low VPA.   -  10-2: Lethargic, jerking , BG 70s, improved with POs.  Resume blood glucose check twice daily. Repeat VPA levels (18).  CMP CBC look okay.    -10/8: Neuro also to eval as above; appreciate their assistance and recommendations. -recommending discontinuation of Atarax and Ativan; will consider if he is able to tolerate transition to dialysis chair. Appreciate their assistance and recommendations.  Folate, B12, and TSH normal.   - 10/13: Will add 300 mg gabapentin to pre-dialysis, Ativan change to PRN 0.5 mg during dialysis only to target behaviors.   - 10/15: During dialysis [facility] improved behaviors with earlier time and allowed to sit in chair; 10-17 continuing to do well, reaccepted to outpatient HD  10-18: Clarified with nursing yesterday for premedication with gabapentin, Ativan not to be given  unless ongoing agitated behaviors.  Do feel this may have contributed to him getting more agitated/confused yesterday.   10/ 20 much better, tolerated HD yesterday   6. Skin/Wound Care: Routine skin care checks             -sacral skin off-loading/protection  DC IV 10/16   7. Fluids/Electrolytes/Nutrition: Strict Is and Os and follow-up chemistries per nephrology             - renal diet/ 1200 cc FR  - Renvela 800 mg 3 times daily for hyperphosphatemia added per nephrology 8-27   - Management per nephrology, labs appear approximately stable, p.o. intakes are appropriate at this time but wax and wane with lethargy   - 10/2-10: Mild hyponatremia 134-133 recently; is not worsening, has dipped into this range before, no apparent cognitive effects. Continue management with dialysis. 10/10 - resolved    8: Hypotension/Hypertension: monitor TID and prn             -continue midodrine as needed during dialysis  9-1: add midodrine 5 mg tid and monitor.  DC Coreg as heart rate has been low to normal; may resume at low-dose once blood pressure improved.  9/13 DC midodrine   - Intermittent hypertension into the 150s, is higher on off dialysis days.  Would maintain higher perfusion pressure given microvascular changes seen on MRI.  No medication adjustments for now.  10-7 - 10/9 well-controlled, with intermittent transient elevations.  10/11: Add amlodipine 2.5 mg daily for consistent mild elevations--> normotensive 10/12-13  -10/17-> increase amlodipine to 5 mg daily; improved 10-18  -10/20 BP well-controlled    05/06/2023    7:00 AM 05/05/2023  8:14 PM 05/05/2023    1:33 PM  Vitals with BMI  Systolic 138 129 161  Diastolic 74 81 66  Pulse 71 87      9: Hyperlipidemia: continue statin   10: CAD s/p CABG 2023: on DAPT and statin.  - troponin elevated but stable, monitor 8/27   11: DM-2: A1c = 6.3% (at home on Lantus 22 units at bedtime, Tradjenta 5 mg daily)        -monitor PO intake              -continue SSI (0-6 units) does not appear to be requiring coverage   - 9/6 DC SSI, change CBGs to daily   - 10-2: Hypoglycemic today, 70s, improved with p.o. intakes, 88 at recheck.  Likely due to poor p.o. intakes last few days.  Start CBG checks twice daily  - 10-4: Patient agitated with CBG checks, refusing.  Moved to as needed for lethargy, AM  - Intermittent mild elevations on BMP but not warranting Tx currently (<200)  -Glucose stable on BMP 155     12: ESRD: HD on Tues, Thurs, Sat -nephrology made aware  -Regularly have to hold dialysis or stopped early due to agitated behaviors, which is ongoing.  Recently has been able to receive all or part of most dialysis sessions.  -Have tried premedicating patient and providing as needed medications with Haldol, Ativan as available on dialysis unit; measures helped temporarily but overall do not seem to affect treatment  -Wife at bedside for dialysis 10-3, did not improve behaviors, per nephrology patient is not a candidate for outpatient dialysis.     -10/5-6 see discussion above. Wife tries to help in HD. However, behaviors continue to make HD difficult.  -Unfortunately, vascular not offering conversion to PD.  Will discuss possible options with nephrology.    - 10/8:  Nephro to proved detailed notes on dialysis behaviors this week to target; goal to be OP HD candidate -agitated 10-10 and 10/12 session, continue to work on behaviors as above  10/15: trialling earlier dialysis session today - went very well! D/w nephology continuing earlier times to avoid aggitation (?sundowning) and allow full sessions. No PRN ativan or atarax needed with pre-gabapentin.  Again, successful session 10-17  10/ 20 reviewed nephrology notes 13: COPD/OSA; chronic home 2L O2 (Symbicort, Ventolin, prednisone 40 mg daily at home)             -continue symbicort  -Added home oxygen back 8-27  -Satting well on room air, oxygen not required   -10/11: Patient's wife  reports home CPAP machine not usable; RT consult to resume nighttime CPAP   - not using   14: GERD: continue Protonix   15: Non-sustained VT/pAF/bradycardia: resolved. per cardiology, Dr. Algie Coffer: -continue carvedilol 6.25 mg BID; consider increasing to 12.5 mg BID before starting low dose amiodarone  -consider Eliquis and discontinue asa and Plavix in one week if no active GI bleeding and stable hemoglobin from 8/24 -hemoglobin has remained stable but very low around 7, no recurrent A-fib, monitor for now given agitation/fall 8-31 and consider initiating Eliquis when safe -Some intermittent recurrent bradycardia while at inpatient rehab, improved with discontinuation of Elavil and Geodon -9/11 EKG for bradycardia episode today, 2nd degree A-V block mobitz type 1.  Continue to hold beta blocker,9/11 will restart eliquis at 2.5 mg BID dose. Will consult cardiology regarding need for ASA/plavix and coreg.  Dr. Elease Hashimoto does not appear convinced that he has A-fib 9/12 per cardiology,  DAPT transition to daily aspirin 81 only and Eliquis was discontinued - Heart rate currently well-controlled, hypertension as above, continuing to hold Eliquis due to falls/poor safety awareness, starting SCDs as above -10/19 intermittent bradycardia however heart rate stable overall   16: HFrEF: Daily weights fluctuating significantly d/t dialysis. Monitor for signs of overload. No current concerns. Weights / volume stable with dialysis.    17: Liver nodularity on CT scan: LFTs reported to be normal. Avoid hepatotoxic medications    -Abdominal ultrasound earlier this admission with minimal ascites  -Ammonia levels have remained normal on multiple checks   18: Epistaxis - resolved             -previously improved with flonase and saline spray   19: Morbid Obesity. Dietary counseling Body mass index is 35.72 kg/m.    20. Hematuria - resolved  9/22- required in/out to check U/A- having new hematuria- nursing to  keep an eye- since also bleeding from penis a little- monitor   9/23 no wBCs  on UA, follow  urine culture-Staphylococcus hemolyticus-discussed with pharmacy and ID pharmacy.  Think this is more likely contaminant  9/25 discuss with nephrology about possible treatment or other evaluation options  Spoke with Dr. Ronalee Belts, most likely contaminant.  Recommended treating only if symptomatic.  If hematuria continues consider urology input  9/27 urine has improved to clear yellow   21.  Vitamin C deficiency.  Aggressive repletion per psychiatry, 100 mg IV x 6+ 500 mg p.o. daily.  Feel this may help his cognition.  -04-22-09: Am noting some slightly improved attention, memory on gross exam since IV course; continue PO supplementation  23.  Chest pain a.m. 10-3.  EKG unchanged, self resolved.  -control bp as possible  24. Bilateral ankle pain. Changing locations, no known trauma, no instability, edema, or point tenderness. ?Peripheral neuropathy related; may benefit from Qutenza but would not add sedating medications for tx at this time.   -Resolved  LOS: 55 days A FACE TO FACE EVALUATION WAS PERFORMED  Fanny Dance 05/06/2023, 4:08 PM

## 2023-05-07 ENCOUNTER — Other Ambulatory Visit (HOSPITAL_COMMUNITY): Payer: Self-pay

## 2023-05-07 LAB — PTH, INTACT AND CALCIUM
Calcium, Total (PTH): 9.2 mg/dL (ref 8.6–10.2)
PTH: 493 pg/mL — ABNORMAL HIGH (ref 15–65)

## 2023-05-07 LAB — PARATHYROID HORMONE, INTACT (NO CA): PTH: 429 pg/mL — ABNORMAL HIGH (ref 15–65)

## 2023-05-07 MED ORDER — TRAZODONE HCL 50 MG PO TABS
50.0000 mg | ORAL_TABLET | Freq: Every day | ORAL | 0 refills | Status: DC
  Filled 2023-05-07: qty 30, 30d supply, fill #0

## 2023-05-07 MED ORDER — FLUTICASONE FUROATE-VILANTEROL 200-25 MCG/ACT IN AEPB
1.0000 | INHALATION_SPRAY | Freq: Every day | RESPIRATORY_TRACT | 0 refills | Status: DC
  Filled 2023-05-07: qty 60, 30d supply, fill #0

## 2023-05-07 MED ORDER — HYDROXYZINE HCL 10 MG PO TABS
10.0000 mg | ORAL_TABLET | Freq: Three times a day (TID) | ORAL | 1 refills | Status: DC | PRN
  Filled 2023-05-07: qty 15, 5d supply, fill #0

## 2023-05-07 MED ORDER — LORAZEPAM 0.5 MG PO TABS
0.5000 mg | ORAL_TABLET | Freq: Every day | ORAL | 0 refills | Status: DC | PRN
  Filled 2023-05-07: qty 12, 28d supply, fill #0

## 2023-05-07 MED ORDER — AMLODIPINE BESYLATE 5 MG PO TABS
5.0000 mg | ORAL_TABLET | Freq: Every day | ORAL | 0 refills | Status: DC
  Filled 2023-05-07: qty 30, 30d supply, fill #0

## 2023-05-07 MED ORDER — ASCORBIC ACID 500 MG PO TABS
500.0000 mg | ORAL_TABLET | Freq: Every day | ORAL | 0 refills | Status: AC
  Filled 2023-05-07: qty 100, 100d supply, fill #0

## 2023-05-07 MED ORDER — ACETAMINOPHEN 325 MG PO TABS: 325.0000 mg | ORAL_TABLET | ORAL | Status: DC | PRN

## 2023-05-07 MED ORDER — ARIPIPRAZOLE 10 MG PO TABS
10.0000 mg | ORAL_TABLET | Freq: Every day | ORAL | 0 refills | Status: DC
  Filled 2023-05-07: qty 30, 30d supply, fill #0

## 2023-05-07 MED ORDER — DARBEPOETIN ALFA 150 MCG/0.3ML IJ SOSY: 150.0000 ug | PREFILLED_SYRINGE | INTRAMUSCULAR | Status: DC

## 2023-05-07 MED ORDER — GABAPENTIN 300 MG PO CAPS
300.0000 mg | ORAL_CAPSULE | ORAL | 0 refills | Status: DC
  Filled 2023-05-07: qty 12, 28d supply, fill #0

## 2023-05-07 MED ORDER — DIVALPROEX SODIUM 250 MG PO DR TAB
250.0000 mg | DELAYED_RELEASE_TABLET | Freq: Every day | ORAL | 0 refills | Status: DC
  Filled 2023-05-07: qty 30, 30d supply, fill #0

## 2023-05-07 NOTE — Progress Notes (Signed)
PROGRESS NOTE   Subjective/Complaints  No new concerns or complaints this morning.  No acute events overnight. Patient awake, alert, pleasant this AM. No questions regarding discharge LBM 10/19. Vitals stable.   ROS: Limited by cognition.  Denies fevers, chills,  N/V, abdominal pain, constipation, diarrhea, SOB, cough,chest pain, joint pain, new weakness or paraesthesias, new vision changes   Objective:   No results found. Recent Labs    05/05/23 0750  WBC 4.1  HGB 9.4*  HCT 31.8*  PLT 131*         Recent Labs    05/05/23 0750  NA 136  K 4.7  CL 98  CO2 26  GLUCOSE 155*  BUN 69*  CREATININE 11.05*  CALCIUM 9.2  9.2          Intake/Output Summary (Last 24 hours) at 05/07/2023 0818 Last data filed at 05/07/2023 0700 Gross per 24 hour  Intake 330 ml  Output --  Net 330 ml            Physical Exam: Vital Signs Blood pressure (!) 116/55, pulse 63, temperature 98.3 F (36.8 C), resp. rate 16, height 5\' 4"  (1.626 m), weight 85.1 kg, SpO2 100%.   Constitutional: No distress . Vital signs reviewed.  Sitting up in wheelchair  in room HEENT: NCAT, EOMI, oral membranes moist.  Bilateral eye puffiness ongoing, mildly reduced today.  Cardiovascular: RRR without murmur. No JVD .  Trace lower extremity edema noted  respiratory: CTA Bilaterally without wheezes or rales. Normal effort    GI/Abdomen: BS +, non-tender, non-distended Ext: no clubbing, cyanosis Psych: Cooperative,  pleasant, appropriate.  Calm and pleasant Skin: No evidence of breakdown, no evidence of rash.  + Left upper extremity AV fistula  Neurologic:  + Cognitive deficits - oriented x3 today, when using calendar for cueing - ongoing + Mild cognitive delay - much improved cranial nerves II through XII grossly intact, Moving all 4 extremities antigravity  and against resistance, 5-/5 throughout,  - + bilateral upper > lower  extremity tremors  This waxes and wanes depending on his dialysis. Musculoskeletal: No obvious deformities.  Full active range of motion in all 4 extremities.   Assessment/Plan: 1. Functional deficits which require 15 hr per week of interdisciplinary therapy in a comprehensive inpatient rehab setting. Physiatrist is providing close team supervision and 24 hour management of active medical problems listed below. Physiatrist and rehab team continue to assess barriers to discharge/monitor patient progress toward functional and medical goals  Care Tool:  Bathing    Body parts bathed by patient: Right arm, Left arm, Chest, Abdomen, Front perineal area, Right upper leg, Buttocks, Left upper leg, Right lower leg, Left lower leg, Face   Body parts bathed by helper: Right arm, Left arm, Chest, Abdomen, Front perineal area, Buttocks, Right upper leg, Left upper leg, Right lower leg, Left lower leg, Face     Bathing assist Assist Level: Moderate Assistance - Patient 50 - 74%     Upper Body Dressing/Undressing Upper body dressing   What is the patient wearing?: Pull over shirt    Upper body assist Assist Level: Minimal Assistance - Patient > 75%    Lower  Body Dressing/Undressing      Lower body dressing      What is the patient wearing?: Underwear/pull up, Pants     Lower body assist Assist for lower body dressing: Moderate Assistance - Patient 50 - 74%     Toileting Toileting    Toileting assist Assist for toileting: Minimal Assistance - Patient > 75%     Transfers Chair/bed transfer  Transfers assist     Chair/bed transfer assist level: Contact Guard/Touching assist     Locomotion Ambulation   Ambulation assist      Assist level: Contact Guard/Touching assist Assistive device: Walker-rolling Max distance: 150   Walk 10 feet activity   Assist     Assist level: Contact Guard/Touching assist Assistive device: Walker-rolling   Walk 50 feet  activity   Assist Walk 50 feet with 2 turns activity did not occur: Safety/medical concerns  Assist level: Contact Guard/Touching assist Assistive device: Walker-rolling    Walk 150 feet activity   Assist Walk 150 feet activity did not occur: Safety/medical concerns  Assist level: Contact Guard/Touching assist Assistive device: Walker-rolling    Walk 10 feet on uneven surface  activity   Assist Walk 10 feet on uneven surfaces activity did not occur: Safety/medical concerns   Assist level: Minimal Assistance - Patient > 75% Assistive device: Walker-rolling   Wheelchair     Assist Is the patient using a wheelchair?: Yes Type of Wheelchair: Manual    Wheelchair assist level: Supervision/Verbal cueing Max wheelchair distance: 50    Wheelchair 50 feet with 2 turns activity    Assist        Assist Level: Supervision/Verbal cueing   Wheelchair 150 feet activity     Assist      Assist Level: Dependent - Patient 0%   Blood pressure (!) 116/55, pulse 63, temperature 98.3 F (36.8 C), resp. rate 16, height 5\' 4"  (1.626 m), weight 85.1 kg, SpO2 100%.  Medical Problem List and Plan: 1. Functional deficits secondary to metabolic encephalopathy due to sepsis secondary to catheter peritonitis and hypoxemia                -[facility] -- encephalopathy/aggitation has developed since approximately 8/22, with decline earlier following COVID infection per wife              -ELOS/Goals: 16-18 days, PT/OT Sup, SLP min A - DC goal 10/21, home with outpatient HD!- Per wife, patient will need to be SPV/CGA level with rolling walker for return home.   - 10/11: Long discussion with wife, by 10/15 we will need to dispo plan home with [facility] and ER for HD vs. to SNF with HD capabilities.  Currently, no options for home or OP dialysis.  Continuing to work on improving behaviors so that he may qualify for outpatient dialysis.  Not considering hospice/palliative  at this time.  -10/15: Team thinks CGA with RW may be realistic given improvements over the last week. Will get family training this week. Plan for DC 10/21; hopeful will return to [facility] but if not will use ER for dialysis in the interim until they can establish. Offered SNF with Dialysis but wife prefers this plan.   10-17: Accepted back to [facility] for outpatient HD; proceed with family training, plan for discharge 10/21  10/21: The patient is medically ready for discharge to home and will need follow-up with [facility] PM&R. In addition, they will need to follow up with their PCP, Nephrology.  2.  Antithrombotics: -DVT/anticoagulation:  Pharmaceutical: Heparin, 03/23/23 changed to Lovenox -  consistent refusal -  started on Eliquis 2.5 mg BID - Dced 9/12 per cardiology "With patient's chronic anemia and agitation/recent falls, appears that risk of OAC>benefits at this time... ". SCDs at bedtime added 10/4.              -antiplatelet therapy: aspirin and Plavix --> ASA alone per cardiology 9/11   3. Pain Management:              -Lidoderm patches to back, Tylenol every 4 hours as needed's, muscle rub as needed, and oxycodone 2.5 mg 3 times daily as needed  -Elavil 25 mg at bedtime for nerve pain and sleep 8/27 -weaned off 9-7 due to oversedation  - No use of PRNs or general complaints of pain since 9-29  4.  Lethargy/ intermittent agitation /insomnia: Persistent despite interventions  -Weaned off of veil bed, remains on telesitter for safety.  Appears somewhat better with wife at bedside and when seated at the station.             -Admitted on Seroquel 50 mg TID -weaned off             -clonazepam 0.5 mg TID prn -transition to lorazepam 0.5 mg daily p.o. or IV as needed for anxiety, agitation -10-4 psychiatry premedicating dialysis with1 mg ativan - changed to PRN 10/15 -Currently have Ativan 0.5 mg PRN with dialysis for agitation; redirect and avoid overstimulation as much as possible.   - Avoid  increasing sedating medications as much as possible, as there is a direct effect on patient's sleep, confusion, and agitation up to a day after receiving PRN antipsychotics and Ativan. - Responsive to low stimulating environment, music, and redirection with gum and television.  Remains intermittently agitated, especially with dialysis and at nighttime, but is significantly improving over the weeks.                - Dced Geodon 10 mg IM q 12 hours prn d/t  switched to Abilify per psychiatry 9-17             -8-28 added depakote 125 mg q8h for ongoing aggitation, mood stabilization -discontinued 9-2, resumed 9-21, increased 9-23 - level 18 subtherapeutic increased to 250 mg Q12h 10/4 -> psych changing to long-acting to 250 mg nightly 10-6; repeat levels 10-8 - <10  - 10-8: D/w psych adjusting vs. Dcing depakote; in agreement with plan, Depakote Dc'd 10/9 --> resumed 250 mg at bedtime per psych 10/14; report free levels likely much higher than plasma concentration and this dose may be adequite  - 9-17 Abilify started per psychiatry 5 mg -> 7.5 mg 9-27 -> 10 mg 10/11   - Will need OP labs, LFTs for monitorring Abilify and Depakote   - 9-24 memantine 5 mg daily started per psychiatry, discontinued 10-3 per family request due to increasing confusion and lethargy -Psychiatry following, appreciate their assistance and recommendations in managing this patient - signed off 10/16  10/8: Poor sleep overnight, remains pleasant and cooperative today and appreciate overall slight improvement in behaviors this week. 10/9: Neurology noting pre-existing cognitive deficits documented on the outpatient side; while these are significantly worse, may be beneficial to reach out to other family members regarding his prior cognitive baseline.  Will also look into family bringing in CPAP. 10/12: CPAP ordered.  Psych reccommending possibly increasing abilify next week if tolerated. D/t no PRN use in some time, DC at  bedtime  scheduled trazadone to limit psychoactive medication burden.   10/13: Poor sleep, resume trazadone 50 mg at bedtime.  As seen with prior administrations, believe predialysis Ativan is causing significant lethargy and confusion up to a day after administration 10/15 - 21: remains impulsive overnights but no PRNs needed -behaviors do seem reduced with readdition of Depakote - continue  5. Hypoactive delirium/metabolic encephalopathy/dementia with sundowning: This patient is not capable of making decisions on his own behalf.  Persistent despite significant efforts to workup/treat.  -  8/29: EEG negative, 8-30: MRI showing chronic microvascular changes, otherwise normal.  Multiple lab workups for encephalopathy with CMP, ammonia, CBC negative except for low VPA.   -  10-2: Lethargic, jerking , BG 70s, improved with POs.  Resume blood glucose check twice daily. Repeat VPA levels (18).  CMP CBC look okay.    -10/8: Neuro also to eval as above; appreciate their assistance and recommendations. -recommending discontinuation of Atarax and Ativan; will consider if he is able to tolerate transition to dialysis chair. Appreciate their assistance and recommendations.  Folate, B12, and TSH normal.   - 10/13: Will add 300 mg gabapentin to pre-dialysis, Ativan change to PRN 0.5 mg during dialysis only to target behaviors.   - 10/15-21: During dialysis [facility] improved behaviors    6. Skin/Wound Care: Routine skin care checks             -sacral skin off-loading/protection  DC IV 10/16   7. Fluids/Electrolytes/Nutrition: Strict Is and Os and follow-up chemistries per nephrology             - renal diet/ 1200 cc FR  - Renvela 800 mg 3 times daily for hyperphosphatemia added per nephrology 8-27   - Management per nephrology, labs appear approximately stable, p.o. intakes are appropriate at this time but wax and wane with lethargy   - 10/2-10: Mild hyponatremia 134-133 recently; is not worsening, has dipped into this  range before, no apparent cognitive effects. Continue management with dialysis. 10/10 - resolved  10/21: PTH elevation labs returned after patient discharge; reached out to ordering PA with Nephrology for follow up instructions    8: Hypotension/Hypertension: monitor TID and prn             -continue midodrine as needed during dialysis  9-1: add midodrine 5 mg tid and monitor.  DC Coreg as heart rate has been low to normal; may resume at low-dose once blood pressure improved.  9/13 DC midodrine   - Intermittent hypertension into the 150s, is higher on off dialysis days.  Would maintain higher perfusion pressure given microvascular changes seen on MRI.  No medication adjustments for now.  10-7 - 10/9 well-controlled, with intermittent transient elevations.  10/11: Add amlodipine 2.5 mg daily for consistent mild elevations--> normotensive 10/12-13  -10/17-> increase amlodipine to 5 mg daily   -10/18-21 BP well-controlled    05/07/2023    5:00 AM 05/07/2023    4:15 AM 05/06/2023    9:19 PM  Vitals with BMI  Weight 187 lbs 10 oz    Systolic  116 132  Diastolic  55 85  Pulse   63     9: Hyperlipidemia: continue statin   10: CAD s/p CABG 2023: on DAPT and statin.  - troponin elevated but stable, monitor 8/27   11: DM-2: A1c = 6.3% (at home on Lantus 22 units at bedtime, Tradjenta 5 mg daily)        -monitor PO intake             -  continue SSI (0-6 units) does not appear to be requiring coverage   - 9/6 DC SSI, change CBGs to daily   - 10-2: Hypoglycemic today, 70s, improved with p.o. intakes, 88 at recheck.  Likely due to poor p.o. intakes last few days.  Start CBG checks twice daily  - 10-4: Patient agitated with CBG checks, refusing.  Moved to as needed for lethargy, AM  - Intermittent mild elevations on BMP but not warranting Tx currently (<200)     12: ESRD: HD on Tues, Thurs, Sat -nephrology made aware  -Regularly have to hold dialysis or stopped early due to agitated  behaviors, which is ongoing.  Recently has been able to receive all or part of most dialysis sessions.  -Have tried premedicating patient and providing as needed medications with Haldol, Ativan as available on dialysis unit; measures helped temporarily but overall do not seem to affect treatment  -Wife at bedside for dialysis 10-3, did not improve behaviors, per nephrology patient is not a candidate for outpatient dialysis.     -10/5-6 see discussion above. Wife tries to help in HD. However, behaviors continue to make HD difficult.  -Unfortunately, vascular not offering conversion to PD.  Will discuss possible options with nephrology.    - 10/8:  Nephro to proved detailed notes on dialysis behaviors this week to target; goal to be OP HD candidate -agitated 10-10 and 10/12 session, continue to work on behaviors as above  - 10/15: trialling earlier dialysis session today - went very well! D/w nephology continuing earlier times to avoid aggitation (?sundowning) and allow full sessions.   - No PRN ativan or atarax needed with pre-gabapentin   13: COPD/OSA; chronic home 2L O2 (Symbicort, Ventolin, prednisone 40 mg daily at home)             -continue symbicort  -Added home oxygen back 8-27  -Satting well on room air, oxygen not required   -10/11: Patient's wife reports home CPAP machine not usable; RT consult to resume nighttime CPAP   - not using   14: GERD: continue Protonix   15: Non-sustained VT/pAF/bradycardia: resolved. per cardiology, Dr. Algie Coffer: -continue carvedilol 6.25 mg BID; consider increasing to 12.5 mg BID before starting low dose amiodarone  -consider Eliquis and discontinue asa and Plavix in one week if no active GI bleeding and stable hemoglobin from 8/24 -hemoglobin has remained stable but very low around 7, no recurrent A-fib, monitor for now given agitation/fall 8-31 and consider initiating Eliquis when safe -Some intermittent recurrent bradycardia while at inpatient rehab,  improved with discontinuation of Elavil and Geodon -9/11 EKG for bradycardia episode today, 2nd degree A-V block mobitz type 1.  Continue to hold beta blocker,9/11 will restart eliquis at 2.5 mg BID dose. Will consult cardiology regarding need for ASA/plavix and coreg.  Dr. Elease Hashimoto does not appear convinced that he has A-fib 9/12 per cardiology, DAPT transition to daily aspirin 81 only and Eliquis was discontinued - Heart rate currently well-controlled, hypertension as above, continuing to hold Eliquis due to falls/poor safety awareness, starting SCDs as above   16: HFrEF: Daily weights fluctuating significantly d/t dialysis. Monitor for signs of overload. No current concerns. Weights / volume stable with dialysis.    17: Liver nodularity on CT scan: LFTs reported to be normal. Avoid hepatotoxic medications    -Abdominal ultrasound earlier this admission with minimal ascites  -Ammonia levels have remained normal on multiple checks   18: Epistaxis - resolved             -  previously improved with flonase and saline spray   19: Morbid Obesity. Dietary counseling Body mass index is 32.2 kg/m.    20. Hematuria - resolved  9/22- required in/out to check U/A- having new hematuria- nursing to keep an eye- since also bleeding from penis a little- monitor   9/23 no wBCs  on UA, follow  urine culture-Staphylococcus hemolyticus-discussed with pharmacy and ID pharmacy.  Think this is more likely contaminant  9/25 discuss with nephrology about possible treatment or other evaluation options  Spoke with Dr. Ronalee Belts, most likely contaminant.  Recommended treating only if symptomatic.  If hematuria continues consider urology input  9/27 urine has improved to clear yellow   21.  Vitamin C deficiency.  Aggressive repletion per psychiatry, 100 mg IV x 6+ 500 mg p.o. daily.  Feel this may help his cognition.  -04-22-09: Am noting some slightly improved attention, memory on gross exam since IV course; continue  PO supplementation  23.  Chest pain a.m. 10-3.  EKG unchanged, self resolved.  -control bp as possible  24. Bilateral ankle pain. Changing locations, no known trauma, no instability, edema, or point tenderness. ?Peripheral neuropathy related; may benefit from Qutenza but would not add sedating medications for tx at this time.   -Resolved  LOS: 56 days A FACE TO FACE EVALUATION WAS PERFORMED  Angelina Sheriff 05/07/2023, 8:18 AM

## 2023-05-07 NOTE — Progress Notes (Signed)
Inpatient Rehabilitation Discharge Medication Review by a Pharmacist  A complete drug regimen review was completed for this patient to identify any potential clinically significant medication issues.  High Risk Drug Classes Is patient taking? Indication by Medication  Antipsychotic Yes Aripiprazole - mood  Anticoagulant No   Antibiotic No   Opioid No   Antiplatelet Yes Aspirin - CAD  Hypoglycemics/insulin No   Vasoactive Medication Yes Amlodipine - HTN  Chemotherapy No   Other Yes Albuterol, Breo, Dulera - asthma, SOB Depakote, Trazodone - mood Atorvastatin - HLD Darbepoetin - ESRD anemia Gabapentin - neuropathy Hydroxyzine, lorazepam - PRN anxiety Vit C - supplement Pantoprazole - GERD     Type of Medication Issue Identified Description of Issue Recommendation(s)  Drug Interaction(s) (clinically significant)     Duplicate Therapy     Allergy     No Medication Administration End Date     Incorrect Dose     Additional Drug Therapy Needed     Significant med changes from prior encounter (inform family/care partners about these prior to discharge). Clopidogrel discontinued per cardiology recs  Midodrine discontinued, new HTN meds  Insulin aspart, Symbicort, flonase, nitroglycerin tabs discontinued Communicate medication changes with patient/family at discharge  Other       Clinically significant medication issues were identified that warrant physician communication and completion of prescribed/recommended actions by midnight of the next day:  No   Pharmacist comments: n/a   Time spent performing this drug regimen review (minutes): 20   Thank you for allowing pharmacy to be a part of this patient's care.  Thelma Barge, PharmD Clinical Pharmacist

## 2023-05-07 NOTE — Progress Notes (Signed)
Patient ID: [name] [name], male   DOB: 03/16/1951, 72 y.o.   MRN: [id number]  SW met with pt and pt wife to discuss a RW being ordered as informed by therapies, he needed a RW versus the walker he has (no wheels). SW unaware. SW will order item and have shipped to home.   SW ordered RW with [employer] via parachute.   Cecile Sheerer, MSW, LCSWA Office: [phone] Cell: [phone] Fax: [phone]

## 2023-05-07 NOTE — Progress Notes (Signed)
Inpatient Rehabilitation Care Coordinator Discharge Note   Patient Details  Name: NANAYAW WENNBERG MRN: [id number] Date of Birth: March 16, 1951   Discharge location: D/c to home  Length of Stay: 55 days  Discharge activity level: Min Asst  Home/community participation: Limited  Patient response VQ:QVZDGL Literacy - How often do you need to have someone help you when you read instructions, pamphlets, or other written material from your doctor or pharmacy?: Never  Patient response OV:FIEPPI Isolation - How often do you feel lonely or isolated from those around you?: Patient unable to respond  Services provided included: MD, RD, PT, OT, SLP, RN, Pharmacy, Neuropsych, SW, TR, CM  Financial Services:  Field seismologist Utilized: Medicare    Choices offered to/list presented to: patient wife  Follow-up services arranged:  Home Health, DME Home Health Agency: [facility] for HHPT/OT/SLP    DME : [employer] for RW    Patient response to transportation need: Is the patient able to respond to transportation needs?: Yes In the past 12 months, has lack of transportation kept you from medical appointments or from getting medications?: No In the past 12 months, has lack of transportation kept you from meetings, work, or from getting things needed for daily living?: No   Patient/Family verbalized understanding of follow-up arrangements:  Yes  Individual responsible for coordination of the follow-up plan: contact pt wife [name] [phone]  Confirmed correct DME delivered: Gretchen Short 05/07/2023    Comments (or additional information):fam edu completed  Summary of Stay    Date/Time Discharge Planning CSW  04/30/23 1422 Pt unable to be switched to PD. Pt continues to have behaviors while in HD and unable to secure outpatient HD. May consider outof state locations for HD and SNF. Marland Kitchen Wife is willing to take pt home if this is the only option. SW will confirm there are no  barriers to discharge. AAC  04/23/23 1407 D/c likely to home due to concerns related to switch back to PD from HD due to behaviors during HD and no outpatient HD clinic willing to accept at this time. SW waiting on confirmation from nephrology about options. Wife is willing to take pt home if this is the only option. SW will confirm there are no barriers to discharge. AAC  04/16/23 1434 D/C to SNF reamins, spouse unable to provide 24/7 care due to working 2x a week and other responsibilties. SNF referral has been sent, behaviors continue. D/C pending and tentative to behaviors and behaviots do not allow patient to be appropriate for OP HD. SW will continue to follow. CJB  04/13/23 1002 D/c plan remains SNF placement as wife is unable to provide 24/7 care due to working twice a week, and other responsibilities. SNF referral sent out on 9/23 as behaviors will continue. Discharge pending behaviors as behaviors will not allow pt to be appropriate for outpatient HD. SW will continue to confirm there are no barriers to discharge. AAC  04/09/23 1118 D/c plan remains SNF placement as wife is unable to provide 24/7 care due to working twice a week, and other responsibilities. SNF referral sent out on 9/23 as behaviors will continue. SW will continue to confirm there are no barriers to discharge. AAC  04/03/23 0929 D/c plan remains SNF placement as wife is unable to provide 24/7 care due to working twice a week, and other responsibilities. Pt is not ready to explore SNF options due to behaviors. When appropriate, referral will eb sent. SW will continue to confirm there  are no barriers to discharge. AAC  03/26/23 1009 PT is now SNF placement as wife is unable to provide 24/7 care due to working twice a week, and other responsibilities. Pt is not ready to explore SNF options due to behaviors. When appropriate, referral will eb sent. SW will continue to confirm there are no barriers to discharge. AAC  03/20/23 0949 D/c  plan remains to be to home with his wife who is primary caregiver. No other natural supports. Wife would like forhim to be able to perform his own self-care but will complete if necessary. States she is unable to physically lift him. She will transport to/from dialysis- [facility] in [place] for TRS schedule . SW will confirm there are no barriers to discharge. AAC  03/12/23 1526 Pt will d/c to home with his wife who is primary caregiver. No other natural supports. Wife would like forhim to be able to perform his own self-care but will complete if necessary. States she is unable to physically lift him. She will transport to/from dialysis- [facility] in [place] for TRS schedule . SW will confirm there are no barriers to discharge. AAC       Halynn Reitano A Lula Olszewski

## 2023-05-07 NOTE — Plan of Care (Signed)
  Problem: RH Swallowing Goal: LTG Patient will consume least restrictive diet using compensatory strategies with assistance (SLP) Description: LTG:  Patient will consume least restrictive diet using compensatory strategies with assistance (SLP) Outcome: Completed/Met   Problem: RH Cognition - SLP Goal: RH LTG Patient will demonstrate orientation with cues Description:  LTG:  Patient will demonstrate orientation to person/place/time/situation with cues (SLP)   Outcome: Completed/Met   Problem: RH Attention Goal: LTG Patient will demonstrate this level of attention during functional activites (SLP) Description: LTG:  Patient will will demonstrate this level of attention during functional activites (SLP) Outcome: Completed/Met

## 2023-05-07 NOTE — Progress Notes (Addendum)
[facility] Progress Note   Subjective:   Patient seen and examined at bedside. No complaints. He reports that he is being discharged today.  Objective Vitals:   05/06/23 0700 05/06/23 2119 05/07/23 0415 05/07/23 0500  BP: 138/74 132/85 (!) 116/55   Pulse: 71 63    Resp:  16 16   Temp: 97.8 F (36.6 C) 98.2 F (36.8 C) 98.3 F (36.8 C)   TempSrc: Oral Oral    SpO2: 100% 100% 100%   Weight:    85.1 kg  Height:       Physical Exam General:chronically ill appearing male in NAD Heart:RRR, no mrg Lungs: cta bl, nml WOB on RA Abdomen: obese, soft, NTND Extremities:trace LE edema Dialysis Access: LU AVF +b/t   Filed Weights   05/07/23 0500  Weight: 85.1 kg    Intake/Output Summary (Last 24 hours) at 05/07/2023 1022 Last data filed at 05/07/2023 0700 Gross per 24 hour  Intake 330 ml  Output --  Net 330 ml    Additional Objective Labs: Basic Metabolic Panel: Recent Labs  Lab 05/03/23 0820 05/05/23 0750  NA 134* 136  K 4.0 4.7  CL 94* 98  CO2 22 26  GLUCOSE 145* 155*  BUN 72* 69*  CREATININE 10.92* 11.05*  CALCIUM 9.3 9.2  9.2  PHOS 4.9* 4.1   Liver Function Tests: Recent Labs  Lab 05/03/23 0820 05/05/23 0750  ALBUMIN 2.4* 2.6*   CBC: Recent Labs  Lab 05/01/23 1322 05/03/23 0820 05/05/23 0750  WBC 3.8* 3.8* 4.1  HGB 9.5* 10.2* 9.4*  HCT 30.7* 33.7* 31.8*  MCV 90.0 89.9 89.6  PLT 137* 139* 131*     Medications:   amLODipine  5 mg Oral Daily   ARIPiprazole  10 mg Oral QHS   ascorbic acid  500 mg Oral Daily   aspirin EC  81 mg Oral Daily   atorvastatin  80 mg Oral Daily   Chlorhexidine Gluconate Cloth  6 each Topical Q0600   darbepoetin (ARANESP) injection - DIALYSIS  150 mcg Subcutaneous Q Sun-1800   divalproex  250 mg Oral QHS   feeding supplement  237 mL Oral BID WC   gabapentin  300 mg Oral Q T,Th,Sa-HD   Gerhardt's butt cream  1 Application Topical QID   mometasone-formoterol  2 puff Inhalation BID   pantoprazole   40 mg Oral BID   traZODone  50 mg Oral QHS    Dialysis Orders: Previously on [facility]CPD thru [facility] - changed to HD . 4hr, 3K, EDW ~94, L AVF  Accepted back for in center HD   Assessment/Plan:   Debility - in CIR. Appropriateness for CIR nearing its end - discharge date is 10/21. Encephalopathy/agitation: Waxing/waning agitation, dementia, has significant sundowning. No acute findings on imaging. Neuro and Psych consulting to help improve behavior. He didn't do well with HD on 10/9 and 10/12. Did not improve with wife at bedside previously, not a candidate for home dialysis at this time. Meds being adjusted per primary team/psychiatry team - much improved this week - agreeable/calm and dialyzing in recliner.  ESRD: Transitioned from PD to HD - TTS schedule. Using old AVF which is still functional. TRYING TO DIALYZE AS EARLY AS POSSIBLE IN DAY IN RECLINER, this makes a huge difference because he gets significant sundowning - did tremendously better on 10/15 and 10/19.  Has been accepted back to [facility].  Hypertension/volume: BP ok - off midodrine, now on low dose amlodipine. + edema, UF as  tolerated. Anemia: Continue Aranesp weekly. Last Tsat 38% with ferritin 2175. Hgb stable 9-10s. Metabolic bone disease: CorrCa and phos in goal. No VDRA.  Nutrition:  Alb low, continue supps.  Renal diet w/fluid restrictions.  Hx NSVT: no meds at this time. Hx culture negative peritonitis and associated fungemia + enterococcus bacteremia - underwent PD cath removal, TDC placement and removal, temp HD cath placement  and removal, and line holiday. Completed IV unasyn, IV micafungin and cipro on 8/15. TDC not replaced cause he had functional AVF Dispo - Previous issues with safety during HD making patient unable to discharge. He is not a candidate for LTAC per SW. Reviewed consideration for hospice - wife not interested in this. SEE ABOVE, MUCH BETTER ON 10/15 and 10/19.  Given improvement in  behavior, he appears stable for outpatient dialysis.  Has been accepted back to [facility] on TTS schedule. Start date 05/08/23 10:30am chair time.  Anthony Sar, MD [facility]

## 2023-05-07 NOTE — Progress Notes (Addendum)
D/C order noted. Contacted FA at [facility]. FA advised pt will d/c today and confirmed pt can start tomorrow. Pt will need to arrive at 9:45 am to complete paperwork prior to 10:30 am chair time. FA aware that pt's wife is willing and planning to sit with pt at HD treatments. FA agreeable to plan. Spoke to pt's wife via phone. Wife confirmed knowledge of HD appt for tomorrow and advised her to please arrive at 9:45 am. Wife agreeable to plan. Arrangements added to AVS as well. Will fax d/c summary to clinic once available for continuation of care.   Olivia Canter Renal Navigator [phone]  Addendum at 12:51 pm: D/C summary and today's renal note faxed to [facility] for continuation of care.

## 2023-05-07 NOTE — Plan of Care (Signed)
  Problem: RH Balance Goal: LTG: Patient will maintain dynamic sitting balance (OT) Description: LTG:  Patient will maintain dynamic sitting balance with assistance during activities of daily living (OT) Outcome: Completed/Met Goal: LTG Patient will maintain dynamic standing with ADLs (OT) Description: LTG:  Patient will maintain dynamic standing balance with assist during activities of daily living (OT)  Outcome: Completed/Met   Problem: Sit to Stand Goal: LTG:  Patient will perform sit to stand in prep for activites of daily living with assistance level (OT) Description: LTG:  Patient will perform sit to stand in prep for activites of daily living with assistance level (OT) Outcome: Completed/Met   Problem: RH Eating Goal: LTG Patient will perform eating w/assist, cues/equip (OT) Description: LTG: Patient will perform eating with assist, with/without cues using equipment (OT) Outcome: Completed/Met   Problem: RH Grooming Goal: LTG Patient will perform grooming w/assist,cues/equip (OT) Description: LTG: Patient will perform grooming with assist, with/without cues using equipment (OT) Outcome: Completed/Met   Problem: RH Bathing Goal: LTG Patient will bathe all body parts with assist levels (OT) Description: LTG: Patient will bathe all body parts with assist levels (OT) Outcome: Completed/Met   Problem: RH Dressing Goal: LTG Patient will perform upper body dressing (OT) Description: LTG Patient will perform upper body dressing with assist, with/without cues (OT). Outcome: Completed/Met   Problem: RH Toilet Transfers Goal: LTG Patient will perform toilet transfers w/assist (OT) Description: LTG: Patient will perform toilet transfers with assist, with/without cues using equipment (OT) Outcome: Completed/Met   Problem: RH Attention Goal: LTG Patient will demonstrate this level of attention during functional activites (OT) Description: LTG:  Patient will demonstrate this level of  attention during functional activites  (OT) Outcome: Completed/Met   Problem: RH Awareness Goal: LTG: Patient will demonstrate awareness during functional activites type of (OT) Description: LTG: Patient will demonstrate awareness during functional activites type of (OT) Outcome: Completed/Met

## 2023-05-07 NOTE — Progress Notes (Signed)
Speech Language Pathology Discharge Summary  Patient Details  Name: [name] [name] MRN: [id number] Date of Birth: 1951-07-13  Date of Discharge from SLP service:May 07, 2023  Patient has met 3 of 3 long term goals.  Patient to discharge at overall Mod level.   Reasons goals not met: N/A   Clinical Impression/Discharge Summary: Patient has made slow but functional gains and has met 3 of 3 LTGs this admission. Currently, patient is consuming regular textures with thin liquids with minimal overt s/s of aspiration and requires supervision level verbal cues for use of swallowing compensatory strategies. Patient continues to demonstrate moderate to severe cognitive impairments but have made functional improvements in orientation, sustained attention, and overall safety. Patient and family eduction is complete and patient will discharge home with 24 hour supervision from family. Patient would benefit from f/u SLP services to maximize his cognitive functioning and overall functional independence in order to reduce caregiver burden.   Care Partner:  Caregiver Able to Provide Assistance: Yes  Type of Caregiver Assistance: Physical;Cognitive  Recommendation:  Home Health SLP;24 hour supervision/assistance  Rationale for SLP Follow Up: Maximize cognitive function and independence;Reduce caregiver burden   Equipment: N/A   Reasons for discharge: Discharged from hospital;Treatment goals met   Patient/Family Agrees with Progress Made and Goals Achieved: Yes    Burak Zerbe 05/07/2023, 6:27 AM

## 2023-05-07 NOTE — Progress Notes (Signed)
Occupational Therapy Discharge Summary  Patient Details  Name: [name] [name] MRN: [id number] Date of Birth: [birth year]  Date of Discharge from OT service:May 07, 2023   Patient has met 10 of 10 long term goals due to improved activity tolerance, improved balance, postural control, ability to compensate for deficits, improved attention, improved awareness, and improved coordination.  Patient to discharge at [facility] Assist level.  Patient's care partner is independent to provide the necessary physical, cognitive, and    assistance at discharge.    Reasons goals not met: n/a  Recommendation:  Patient will benefit from ongoing skilled OT services in home health setting to continue to advance functional skills in the area of BADL, iADL, and Reduce care partner burden.  Equipment:    Reasons for discharge: treatment goals met and discharge from hospital  Patient/family agrees with progress made and goals achieved: Yes  OT Discharge Precautions/Restrictions  Precautions Precautions: Fall Precaution Comments: impulsive Restrictions Weight Bearing Restrictions: No General   Vital Signs   Pain   ADL ADL Eating: Set up Grooming: Supervision/safety Upper Body Bathing: Supervision/safety Lower Body Bathing: Moderate assistance Upper Body Dressing: Minimal assistance Lower Body Dressing: Moderate assistance Toileting: Minimal assistance Toilet Transfer: Furniture conservator/restorer Method: Stand pivot Tub/Shower Transfer: Contact guard Vision   Perception    Praxis   Cognition Cognition Overall Cognitive Status: Impaired/Different from baseline Arousal/Alertness: Awake/alert Orientation Level: Person;Situation;Place Person: Oriented Place: Oriented Situation: Oriented Memory: Impaired Awareness: Impaired Behaviors: Restless;Impulsive (greatly improved since eval) Brief Interview for Mental Status (BIMS) Repetition of Three Words (First Attempt):  3 Temporal Orientation: Year: Correct Temporal Orientation: Month: Missed by 6 days to 1 month Temporal Orientation: Day: Correct Recall: "Sock": Yes, after cueing ("something to wear") Recall: "Blue": Yes, after cueing ("a color") Recall: "Bed": Yes, no cue required BIMS Summary Score: 12 Sensation Sensation Light Touch: Appears Intact Coordination Gross Motor Movements are Fluid and Coordinated: Yes Fine Motor Movements are Fluid and Coordinated: Yes Motor  Motor Motor - Discharge Observations: generalized weakness, but improved since eval Mobility  Bed Mobility Bed Mobility: Rolling Right;Supine to Sit;Sit to Supine Rolling Right: Supervision/verbal cueing Supine to Sit: Supervision/Verbal cueing Sit to Supine: Supervision/Verbal cueing Transfers Sit to Stand: Contact Guard/Touching assist Stand to Sit: Contact Guard/Touching assist  Trunk/Postural Assessment     Balance Static Sitting Balance Static Sitting - Balance Support: Feet supported Static Sitting - Level of Assistance: 5: Stand by assistance Dynamic Sitting Balance Dynamic Sitting - Balance Support: Feet supported Dynamic Sitting - Level of Assistance: 5: Stand by assistance Static Standing Balance Static Standing - Balance Support: During functional activity;Bilateral upper extremity supported Static Standing - Level of Assistance: 5: Stand by assistance Dynamic Standing Balance Dynamic Standing - Balance Support: Bilateral upper extremity supported;During functional activity Dynamic Standing - Level of Assistance: 5: Stand by assistance Extremity/Trunk Assessment RUE Assessment RUE Assessment: Within Functional Limits LUE Assessment LUE Assessment: Within Functional Limits   [name] [name] 05/07/2023, 1:52 PM

## 2023-05-08 ENCOUNTER — Telehealth: Payer: Self-pay

## 2023-05-08 DIAGNOSIS — Z23 Encounter for immunization: Secondary | ICD-10-CM | POA: Diagnosis not present

## 2023-05-08 DIAGNOSIS — D631 Anemia in chronic kidney disease: Secondary | ICD-10-CM | POA: Diagnosis not present

## 2023-05-08 DIAGNOSIS — N250 Renal osteodystrophy: Secondary | ICD-10-CM | POA: Diagnosis not present

## 2023-05-08 DIAGNOSIS — Z1159 Encounter for screening for other viral diseases: Secondary | ICD-10-CM | POA: Diagnosis not present

## 2023-05-08 DIAGNOSIS — N186 End stage renal disease: Secondary | ICD-10-CM | POA: Diagnosis not present

## 2023-05-08 DIAGNOSIS — Z992 Dependence on renal dialysis: Secondary | ICD-10-CM | POA: Diagnosis not present

## 2023-05-08 DIAGNOSIS — I214 Non-ST elevation (NSTEMI) myocardial infarction: Secondary | ICD-10-CM

## 2023-05-08 NOTE — Consult Note (Signed)
Value-Based Care Institute  [facility] Inpatient Consult   05/08/2023  [name] [name] 04-Feb-1951 [id number]  [facility] [[facility]]  Accountable Care Organization [ACO] Patient: Medicare ACO REACH  Primary Care Provider: Benetta Spar, MD   Patient evaluated for community based chronic complex disease management services with [facility] Care Management Program as a benefit of patient's Medicare Insurance. Spoke with [name] [name] [wife] HIPAA verified to explain [facility] Care Management services.   Explained that the patient qualifies for community care coordination follow up as a benefit.   Wife states that patient is unable to use his cell phone best number to call is [phone] and leave a message on the answering machine or call her cell phone.   Patient will receive post hospital discharge call and will be evaluated for complex disease management.      Of note, [facility] Care Management services does not replace or interfere with any services that are arranged by inpatient case management or social work.  For additional questions or referrals please contact:    Charlesetta Shanks, RN, BSN, CCM [place]  [facility], [facility] Liaison Direct Dial: [phone] or secure chat Website: [email or web address]

## 2023-05-09 ENCOUNTER — Telehealth: Payer: Self-pay | Admitting: *Deleted

## 2023-05-09 DIAGNOSIS — F0390 Unspecified dementia without behavioral disturbance: Secondary | ICD-10-CM | POA: Diagnosis not present

## 2023-05-09 DIAGNOSIS — Z951 Presence of aortocoronary bypass graft: Secondary | ICD-10-CM | POA: Diagnosis not present

## 2023-05-09 DIAGNOSIS — E785 Hyperlipidemia, unspecified: Secondary | ICD-10-CM | POA: Diagnosis not present

## 2023-05-09 DIAGNOSIS — Z9981 Dependence on supplemental oxygen: Secondary | ICD-10-CM | POA: Diagnosis not present

## 2023-05-09 DIAGNOSIS — I132 Hypertensive heart and chronic kidney disease with heart failure and with stage 5 chronic kidney disease, or end stage renal disease: Secondary | ICD-10-CM | POA: Diagnosis not present

## 2023-05-09 DIAGNOSIS — I5022 Chronic systolic (congestive) heart failure: Secondary | ICD-10-CM | POA: Diagnosis not present

## 2023-05-09 DIAGNOSIS — Z7951 Long term (current) use of inhaled steroids: Secondary | ICD-10-CM | POA: Diagnosis not present

## 2023-05-09 DIAGNOSIS — E54 Ascorbic acid deficiency: Secondary | ICD-10-CM | POA: Diagnosis not present

## 2023-05-09 DIAGNOSIS — Z6835 Body mass index (BMI) 35.0-35.9, adult: Secondary | ICD-10-CM | POA: Diagnosis not present

## 2023-05-09 DIAGNOSIS — G9341 Metabolic encephalopathy: Secondary | ICD-10-CM | POA: Diagnosis not present

## 2023-05-09 DIAGNOSIS — N186 End stage renal disease: Secondary | ICD-10-CM | POA: Diagnosis not present

## 2023-05-09 DIAGNOSIS — Z794 Long term (current) use of insulin: Secondary | ICD-10-CM | POA: Diagnosis not present

## 2023-05-09 DIAGNOSIS — J449 Chronic obstructive pulmonary disease, unspecified: Secondary | ICD-10-CM | POA: Diagnosis not present

## 2023-05-09 DIAGNOSIS — E1122 Type 2 diabetes mellitus with diabetic chronic kidney disease: Secondary | ICD-10-CM | POA: Diagnosis not present

## 2023-05-09 DIAGNOSIS — Z992 Dependence on renal dialysis: Secondary | ICD-10-CM | POA: Diagnosis not present

## 2023-05-09 DIAGNOSIS — I2510 Atherosclerotic heart disease of native coronary artery without angina pectoris: Secondary | ICD-10-CM | POA: Diagnosis not present

## 2023-05-09 DIAGNOSIS — K219 Gastro-esophageal reflux disease without esophagitis: Secondary | ICD-10-CM | POA: Diagnosis not present

## 2023-05-09 DIAGNOSIS — G4733 Obstructive sleep apnea (adult) (pediatric): Secondary | ICD-10-CM | POA: Diagnosis not present

## 2023-05-09 DIAGNOSIS — A419 Sepsis, unspecified organism: Secondary | ICD-10-CM | POA: Diagnosis not present

## 2023-05-09 NOTE — Progress Notes (Signed)
Care Coordination  Outreach Note  05/09/2023 Name: [name] MRN: [id number] DOB: 1950-12-06   Care Coordination Outreach Attempts: An unsuccessful telephone outreach was attempted today to offer the patient information about available care coordination services.  Follow Up Plan:  Additional outreach attempts will be made to offer the patient care coordination information and services.   Encounter Outcome:  No Answer  Gwenevere Ghazi  Care Coordination Care Guide  Direct Dial: [phone]

## 2023-05-10 DIAGNOSIS — Z992 Dependence on renal dialysis: Secondary | ICD-10-CM | POA: Diagnosis not present

## 2023-05-10 DIAGNOSIS — N186 End stage renal disease: Secondary | ICD-10-CM | POA: Diagnosis not present

## 2023-05-10 DIAGNOSIS — N250 Renal osteodystrophy: Secondary | ICD-10-CM | POA: Diagnosis not present

## 2023-05-10 DIAGNOSIS — D631 Anemia in chronic kidney disease: Secondary | ICD-10-CM | POA: Diagnosis not present

## 2023-05-10 DIAGNOSIS — B351 Tinea unguium: Secondary | ICD-10-CM | POA: Diagnosis not present

## 2023-05-10 DIAGNOSIS — Z1159 Encounter for screening for other viral diseases: Secondary | ICD-10-CM | POA: Diagnosis not present

## 2023-05-10 DIAGNOSIS — E1142 Type 2 diabetes mellitus with diabetic polyneuropathy: Secondary | ICD-10-CM | POA: Diagnosis not present

## 2023-05-10 DIAGNOSIS — Z23 Encounter for immunization: Secondary | ICD-10-CM | POA: Diagnosis not present

## 2023-05-10 NOTE — Progress Notes (Signed)
Care Coordination  Outreach Note  05/10/2023 Name: ZYMIR ENGLEHART MRN: [id number] DOB: 10-17-1950   Care Coordination Outreach Attempts: A second unsuccessful outreach was attempted today to offer the patient with information about available care coordination services.  Follow Up Plan:  Additional outreach attempts will be made to offer the patient care coordination information and services.   Encounter Outcome:  No Answer  Gwenevere Ghazi  Care Coordination Care Guide  Direct Dial: [phone]

## 2023-05-11 DIAGNOSIS — R451 Restlessness and agitation: Secondary | ICD-10-CM | POA: Diagnosis not present

## 2023-05-11 DIAGNOSIS — J449 Chronic obstructive pulmonary disease, unspecified: Secondary | ICD-10-CM | POA: Diagnosis not present

## 2023-05-11 DIAGNOSIS — G9340 Encephalopathy, unspecified: Secondary | ICD-10-CM | POA: Diagnosis not present

## 2023-05-11 DIAGNOSIS — N186 End stage renal disease: Secondary | ICD-10-CM | POA: Diagnosis not present

## 2023-05-11 DIAGNOSIS — I10 Essential (primary) hypertension: Secondary | ICD-10-CM | POA: Diagnosis not present

## 2023-05-11 NOTE — Progress Notes (Signed)
Care Coordination  Outreach Note  05/11/2023 Name: [name] [name] MRN: [id number] DOB: [birth year]   Care Coordination Outreach Attempts: A third unsuccessful outreach was attempted today to offer the patient with information about available care coordination services.  Follow Up Plan:  No further outreach attempts will be made at this time. We have been unable to contact the patient to offer or enroll patient in care coordination services  Encounter Outcome:  No Answer  Gwenevere Ghazi  Care Coordination Care Guide  Direct Dial: [phone]

## 2023-05-12 DIAGNOSIS — Z1159 Encounter for screening for other viral diseases: Secondary | ICD-10-CM | POA: Diagnosis not present

## 2023-05-12 DIAGNOSIS — N186 End stage renal disease: Secondary | ICD-10-CM | POA: Diagnosis not present

## 2023-05-12 DIAGNOSIS — N250 Renal osteodystrophy: Secondary | ICD-10-CM | POA: Diagnosis not present

## 2023-05-12 DIAGNOSIS — Z992 Dependence on renal dialysis: Secondary | ICD-10-CM | POA: Diagnosis not present

## 2023-05-12 DIAGNOSIS — Z23 Encounter for immunization: Secondary | ICD-10-CM | POA: Diagnosis not present

## 2023-05-12 DIAGNOSIS — D631 Anemia in chronic kidney disease: Secondary | ICD-10-CM | POA: Diagnosis not present

## 2023-05-13 DIAGNOSIS — R0602 Shortness of breath: Secondary | ICD-10-CM | POA: Diagnosis not present

## 2023-05-13 DIAGNOSIS — E669 Obesity, unspecified: Secondary | ICD-10-CM | POA: Diagnosis not present

## 2023-05-13 DIAGNOSIS — R030 Elevated blood-pressure reading, without diagnosis of hypertension: Secondary | ICD-10-CM | POA: Diagnosis not present

## 2023-05-13 DIAGNOSIS — R0902 Hypoxemia: Secondary | ICD-10-CM | POA: Diagnosis not present

## 2023-05-13 DIAGNOSIS — Z6839 Body mass index (BMI) 39.0-39.9, adult: Secondary | ICD-10-CM | POA: Diagnosis not present

## 2023-05-15 DIAGNOSIS — N186 End stage renal disease: Secondary | ICD-10-CM | POA: Diagnosis not present

## 2023-05-15 DIAGNOSIS — D631 Anemia in chronic kidney disease: Secondary | ICD-10-CM | POA: Diagnosis not present

## 2023-05-15 DIAGNOSIS — Z23 Encounter for immunization: Secondary | ICD-10-CM | POA: Diagnosis not present

## 2023-05-15 DIAGNOSIS — Z992 Dependence on renal dialysis: Secondary | ICD-10-CM | POA: Diagnosis not present

## 2023-05-15 DIAGNOSIS — N250 Renal osteodystrophy: Secondary | ICD-10-CM | POA: Diagnosis not present

## 2023-05-15 DIAGNOSIS — Z1159 Encounter for screening for other viral diseases: Secondary | ICD-10-CM | POA: Diagnosis not present

## 2023-05-16 ENCOUNTER — Ambulatory Visit (INDEPENDENT_AMBULATORY_CARE_PROVIDER_SITE_OTHER): Payer: Medicare Other | Admitting: Gastroenterology

## 2023-05-16 ENCOUNTER — Telehealth: Payer: Self-pay | Admitting: *Deleted

## 2023-05-16 ENCOUNTER — Encounter: Payer: Self-pay | Admitting: Gastroenterology

## 2023-05-16 VITALS — BP 131/79 | HR 83 | Temp 97.8°F | Ht 64.0 in | Wt 215.7 lb

## 2023-05-16 DIAGNOSIS — K649 Unspecified hemorrhoids: Secondary | ICD-10-CM | POA: Diagnosis not present

## 2023-05-16 DIAGNOSIS — K7469 Other cirrhosis of liver: Secondary | ICD-10-CM | POA: Diagnosis not present

## 2023-05-16 DIAGNOSIS — K7460 Unspecified cirrhosis of liver: Secondary | ICD-10-CM | POA: Insufficient documentation

## 2023-05-16 DIAGNOSIS — R932 Abnormal findings on diagnostic imaging of liver and biliary tract: Secondary | ICD-10-CM

## 2023-05-16 MED ORDER — HYDROCORTISONE (PERIANAL) 2.5 % EX CREA: 1.0000 | TOPICAL_CREAM | Freq: Two times a day (BID) | CUTANEOUS | 1 refills | Status: DC

## 2023-05-16 NOTE — Patient Instructions (Addendum)
Please have blood work done.  Your next ultrasound of your liver will be due in Feb 2025  Start using stool softener twice a day but decrease if you have runny stool or diarrhea. Avoid straining, and limit toilet time to 2-3 minutes.   I am glad you are doing the Hepatitis B vaccinations! We also recommend hepatitis A vaccination if you can get this done as well.   We recommend an endoscopy once cleared by cardiology and pulmonology.  We will see you in 3 months!  I enjoyed seeing you again today! I value our relationship and want to provide genuine, compassionate, and quality care. You may receive a survey regarding your visit with me, and I welcome your feedback! Thanks so much for taking the time to complete this. I look forward to seeing you again.      Gelene Mink, PhD, ANP-BC [facility]astroenter[facility]

## 2023-05-16 NOTE — Telephone Encounter (Signed)
Request for patient to stop medication prior to procedure or is needing cleareance  05/16/23  RYN SKUBAL 10-Dec-1950  What type of surgery is being performed? Esophagogastroduodenoscopy (EGD)  When is surgery scheduled? TBD  What type of clearance is required (medical or pharmacy to hold medication or both? Medical   Patient is needing to have clearance from pulmonary prior to being scheduled for procedure.  Name of physician performing surgery?  Dr.[facility] Gastroenterology at [employer]: [phone] Fax: [phone]  Anethesia type (none, local, MAC, general)? MAC

## 2023-05-16 NOTE — Telephone Encounter (Signed)
Name: [name] [name]  DOB: Aug 16, 1950  MRN: [id number]  Primary Cardiologist: Chrystie Nose, MD  Chart reviewed as part of pre-operative protocol coverage. The patient has an upcoming visit scheduled with Dr. Rennis Golden  on 05/25/2023 at which time clearance can be addressed in case there are any issues that would impact surgical recommendations.  I added preop FYI to appointment note so that provider is aware to address at time of outpatient visit.  Per office protocol the cardiology provider should forward their finalized clearance decision and recommendations regarding antiplatelet therapy to the requesting party below.     I will route this message as FYI to requesting party and remove this message from the preop box as separate preop APP input not needed at this time.   Please call with any questions.  Napoleon Form, Leodis Rains, NP  05/16/2023, 9:14 AM

## 2023-05-16 NOTE — Telephone Encounter (Signed)
Request for patient to stop medication prior to procedure or is needing cleareance  05/16/23  [name] [name] [birth year]  What type of surgery is being performed? Esophagogastroduodenoscopy (EGD)  When is surgery scheduled? TBD  What type of clearance is required (medical or pharmacy to hold medication or both? Medical   Patient is needing to have cardiac clearance prior to being scheduled for procedure.  Name of physician performing surgery?  Dr.[facility] Gastroenterology at [employer]: [phone] Fax: [phone]  Anethesia type (none, local, MAC, general)? MAC

## 2023-05-16 NOTE — Progress Notes (Signed)
On esomeprazole BID,.       Gastroenterology Office Note     Primary Care Physician:  Benetta Spar, MD  Primary Gastroenterologist: Dr. Marletta Lor   Chief Complaint   Chief Complaint  Patient presents with   Anal Itching    Patient here today due to issues with anal itching for the last three days. He says he has a history of hemorrhoids. He denies any pain or rectal bleeding. He has been using Prep H, which has not helped, but he only used it once with in the last three days.      History of Present Illness   [name] [name] is a 72 y.o. male presenting today with a history of GERD, anemia of chronic disease, with symptomatic hemorrhoids. Previously on peritoneal dialysis but now on dialysis. Recent prolonged hospital admission with need for rehab and encephalopathy in setting of sepsis due to peritonitis from PD catheter. While hospitalized, CT abd/pelvis with slightly nodular hepatic contour. Spleen normal. [place] concerning for cirrhosis. Possible 3 mm gallbladder polyp.   Undergoing Hep B vaccination. Acute hepatitis panel negative. Off PD, now on HD. LFTs normal.   Hep C negative Sept 2024.    3-4 days with rectal itching. No prolapsing tissue. Sometimes straining with BM. BM 2-3 times per day. Stool softener as needed. Prolonged toilet time. No rectal bleeding.   Hyperplastic polyps. Colonoscopy 2026.   Past Medical History:  Diagnosis Date   Anemia    Arthritis    Asthma    Bell palsy    CAD (coronary artery disease)    a. 2014 MV: abnl w/ infap ischemia; b. 03/2013 Cath: aneurysmal bleb in the LAD w/ otw nonobs dzs-->Med Rx.   Chronic back pain    Chronic knee pain    a. 09/2015 s/p R TKA.   Chronic pain    Chronic shoulder pain    Chronic sinusitis    COPD (chronic obstructive pulmonary disease) (HCC)    Diabetes mellitus without complication (HCC)    type II    ESRD on peritoneal dialysis (HCC)    on peritoneal dialysis, [facility]   Essential  hypertension    GERD (gastroesophageal reflux disease)    Gout    Gout    Hepatomegaly    noted on noncontrast CT 2015   History of hiatal hernia    Hyperlipidemia    Lateral meniscus tear    Obesity    Truncal   Obstructive sleep apnea    does not use cpap    On home oxygen therapy    uses 2l when is going somewhere per patient    PUD (peptic ulcer disease)    remote, reports f/u EGD about 8 years ago unremarkable    Reactive airway disease    related to exposure to chemical during 9/11   Sinusitis    Vitamin D deficiency     Past Surgical History:  Procedure Laterality Date   A/V FISTULAGRAM N/A 02/28/2023   Procedure: A/V Fistulagram;  Surgeon: Victorino Sparrow, MD;  Location: [facility] INVASIVE CV LAB;  Service: Cardiovascular;  Laterality: N/A;   ASAD LT SHOULDER  12/15/2008   left shoulder   AV FISTULA PLACEMENT Left 08/09/2016   Procedure: BRACHIOCEPHALIC ARTERIOVENOUS (AV) FISTULA CREATION LEFT ARM;  Surgeon: Sherren Kerns, MD;  Location: [facility] OR;  Service: Vascular;  Laterality: Left;   CAPD INSERTION N/A 10/07/2018   Procedure: LAPAROSCOPIC PERITONEAL CATHETER PLACEMENT;  Surgeon: Rodman Pickle, MD;  Location: [facility] ORS;  Service: General;  Laterality: N/A;   CATARACT EXTRACTION W/PHACO Left 03/28/2016   Procedure: CATARACT EXTRACTION PHACO AND INTRAOCULAR LENS PLACEMENT LEFT EYE;  Surgeon: Jethro Bolus, MD;  Location: [facility] ORS;  Service: Ophthalmology;  Laterality: Left;  CDE: 4.77   CATARACT EXTRACTION W/PHACO Right 04/11/2016   Procedure: CATARACT EXTRACTION PHACO AND INTRAOCULAR LENS PLACEMENT RIGHT EYE; CDE:  4.74;  Surgeon: Jethro Bolus, MD;  Location: [facility] ORS;  Service: Ophthalmology;  Laterality: Right;   COLONOSCOPY  10/15/2008   Fields: Rectal polyp obliterated, not retrieved, hemorrhoids, single ascending colon diverticulum near the CV. Next colonoscopy April 2020   COLONOSCOPY N/A 12/25/2014   SLF: 1. Colorectal polyps (2) removed 2. Small internal hemorrhoids  3. the left colon is severely redundant. hyperplastic polyps   CORONARY ARTERY BYPASS GRAFT N/A 06/05/2022   Procedure: OFF PUMP CORONARY ARTERY BYPASS GRAFTING (CABG) X 2 BYPASSES USING LEFT INTERNAL MAMMARY ARTERY AND RIGHT LEG GREATER SAPHENOUS VEIN HARVESTED ENDOSCOPICALLY;  Surgeon: Corliss Skains, MD;  Location: [facility] OR;  Service: Open Heart Surgery;  Laterality: N/A;   CORONARY STENT INTERVENTION N/A 07/25/2021   Procedure: CORONARY STENT INTERVENTION;  Surgeon: Tonny Bollman, MD;  Location: [facility] INVASIVE CV LAB;  Service: Cardiovascular;  Laterality: N/A;   CORONARY STENT INTERVENTION N/A 12/26/2021   Procedure: CORONARY STENT INTERVENTION;  Surgeon: Yvonne Kendall, MD;  Location: [facility] INVASIVE CV LAB;  Service: Cardiovascular;  Laterality: N/A;   CORONARY STENT INTERVENTION N/A 01/20/2022   Procedure: CORONARY STENT INTERVENTION;  Surgeon: Tonny Bollman, MD;  Location: [facility] INVASIVE CV LAB;  Service: Cardiovascular;  Laterality: N/A;   DOPPLER ECHOCARDIOGRAPHY     ESOPHAGOGASTRODUODENOSCOPY N/A 12/25/2014   SLF: 1. Anemia most likely due to CRI, gastritis, gastric polyps 2. Moderate non-erosive gastriits and mild duodenitis.  3.TWo large gstric polyps removed.    EYE SURGERY  12/22/2010   tear duct probing-[place]   FOREIGN BODY REMOVAL  03/29/2011   Procedure: REMOVAL FOREIGN BODY EXTREMITY;  Surgeon: Fuller Canada, MD;  Location: [facility];  Service: Orthopedics;  Laterality: Right;  Removal Foreign Body Right Thumb   IR FLUORO GUIDE CV LINE RIGHT  08/06/2018   IR FLUORO GUIDE CV LINE RIGHT  02/17/2023   IR US GUIDE VASC ACCESS RIGHT  08/06/2018   IR US GUIDE VASC ACCESS RIGHT  02/17/2023   KNEE ARTHROSCOPY  10/16/2007   left   KNEE ARTHROSCOPY WITH LATERAL MENISECTOMY Right 10/14/2015   Procedure: LEFT KNEE ARTHROSCOPY WITH PARTIAL LATERAL MENISECTOMY;  Surgeon: Vickki Hearing, MD;  Location: [facility];  Service: Orthopedics;  Laterality: Right;   LEFT HEART CATH AND CORONARY  ANGIOGRAPHY N/A 07/25/2021   Procedure: LEFT HEART CATH AND CORONARY ANGIOGRAPHY;  Surgeon: Tonny Bollman, MD;  Location: [facility] INVASIVE CV LAB;  Service: Cardiovascular;  Laterality: N/A;   LEFT HEART CATH AND CORONARY ANGIOGRAPHY N/A 12/26/2021   Procedure: LEFT HEART CATH AND CORONARY ANGIOGRAPHY;  Surgeon: Yvonne Kendall, MD;  Location: [facility] INVASIVE CV LAB;  Service: Cardiovascular;  Laterality: N/A;   LEFT HEART CATH AND CORONARY ANGIOGRAPHY N/A 01/20/2022   Procedure: LEFT HEART CATH AND CORONARY ANGIOGRAPHY;  Surgeon: Tonny Bollman, MD;  Location: [facility] INVASIVE CV LAB;  Service: Cardiovascular;  Laterality: N/A;   LEFT HEART CATHETERIZATION WITH CORONARY ANGIOGRAM N/A 03/28/2013   Procedure: LEFT HEART CATHETERIZATION WITH CORONARY ANGIOGRAM;  Surgeon: Marykay Lex, MD;  Location: [facility] CATH LAB;  Service: Cardiovascular;  Laterality: N/A;   NM MYOVIEW LTD     PENILE  PROSTHESIS IMPLANT N/A 08/16/2015   Procedure: PENILE PROTHESIS INFLATABLE, three piece, Excisional biopsy of Penile ulcer, Penile molding;  Surgeon: Jethro Bolus, MD;  Location: [facility] ORS;  Service: Urology;  Laterality: N/A;   PENILE PROSTHESIS IMPLANT N/A 12/24/2017   Procedure: REMOVAL AND  REPLACEMENT  COLOPLAST PENILE PROSTHESIS;  Surgeon: Crista Elliot, MD;  Location: [facility]S;  Service: Urology;  Laterality: N/A;   PERIPHERAL VASCULAR BALLOON ANGIOPLASTY  02/28/2023   Procedure: PERIPHERAL VASCULAR BALLOON ANGIOPLASTY;  Surgeon: Victorino Sparrow, MD;  Location: [facility] INVASIVE CV LAB;  Service: Cardiovascular;;   PORT-A-CATH REMOVAL N/A 02/19/2023   Procedure: REMOVAL OF PERITONEAL DIALYSIS CATHETER;  Surgeon: Victorino Sparrow, MD;  Location: [facility] OR;  Service: Vascular;  Laterality: N/A;   QUADRICEPS TENDON REPAIR  07/21/2011   Procedure: REPAIR QUADRICEP TENDON;  Surgeon: Fuller Canada, MD;  Location: [facility];  Service: Orthopedics;  Laterality: Right;   RIGHT/LEFT HEART CATH AND CORONARY ANGIOGRAPHY N/A 05/30/2022    Procedure: RIGHT/LEFT HEART CATH AND CORONARY ANGIOGRAPHY;  Surgeon: Corky Crafts, MD;  Location: [facility] INVASIVE CV LAB;  Service: Cardiovascular;  Laterality: N/A;   TEE WITHOUT CARDIOVERSION N/A 06/05/2022   Procedure: TRANSESOPHAGEAL ECHOCARDIOGRAM (TEE);  Surgeon: Corliss Skains, MD;  Location: [facility] OR;  Service: Open Heart Surgery;  Laterality: N/A;   TOENAIL EXCISION     removed x2-bilateral   UMBILICAL HERNIA REPAIR  07/17/2005   [place]    Current Outpatient Medications  Medication Sig Dispense Refill   acetaminophen (TYLENOL) 325 MG tablet Take 1-2 tablets (325-650 mg total) by mouth every 4 (four) hours as needed for mild pain (pain score 1-3).     albuterol (VENTOLIN HFA) 108 (90 Base) MCG/ACT inhaler Inhale 2 puffs into the lungs every 6 (six) hours as needed for wheezing or shortness of breath. 18 g 11   amLODipine (NORVASC) 5 MG tablet Take 1 tablet (5 mg total) by mouth daily. 30 tablet 0   ARIPiprazole (ABILIFY) 10 MG tablet Take 10 mg by mouth at bedtime.     ascorbic acid (VITAMIN C) 500 MG tablet Take 1 tablet (500 mg total) by mouth daily. 100 tablet 0   aspirin EC 81 MG tablet Take 81 mg by mouth daily. Swallow whole.     atorvastatin (LIPITOR) 80 MG tablet Take 1 tablet (80 mg total) by mouth daily. 30 tablet 1   Darbepoetin Alfa (ARANESP) 150 MCG/0.3ML SOSY injection Inject 0.3 mLs (150 mcg total) into the skin every Sunday at 6pm.     Darbepoetin Alfa-Albumin (ARANESP IJ) Inject as directed. Done at HD     esomeprazole (NEXIUM) 40 MG capsule Take 40 mg by mouth 2 (two) times daily before a meal.     fluticasone furoate-vilanterol (BREO ELLIPTA) 200-25 MCG/ACT AEPB Use 1 (one) inhaltion once a day. 60 each 0   gabapentin (NEURONTIN) 300 MG capsule Take 1 capsule (300 mg total) by mouth Every Tuesday,Thursday,and Saturday with dialysis. 12 capsule 0   LORazepam (ATIVAN) 0.5 MG tablet Take 1 tablet (0.5 mg total) by mouth daily as needed for anxiety (ONLY WITH  HD). 12 tablet 0   Nystatin (GERHARDT'S BUTT CREAM) CREA Apply 1 Application topically 4 (four) times daily.     traZODone (DESYREL) 50 MG tablet Take 1 tablet (50 mg total) by mouth at bedtime. 30 tablet 0   No current facility-administered medications for this visit.    Allergies as of 05/16/2023 - Review Complete 05/16/2023  Allergen Reaction Noted  Opana [oxymorphone hcl] Itching 01/15/2014   Tramadol Itching 07/10/2011    Family History  Problem Relation Age of Onset   Hypertension Mother        MI   Cancer Mother        breast    Diabetes Mother    Diabetes Father    Hypertension Father    Hypertension Sister    Diabetes Sister    Arthritis Other    Asthma Other    Lung disease Other    Anesthesia problems Neg Hx    Hypotension Neg Hx    Malignant hyperthermia Neg Hx    Pseudochol deficiency Neg Hx    Colon cancer Neg Hx     Social History   Socioeconomic History   Marital status: Married    Spouse name: Not on file   Number of children: 2   Years of education: 12th grade   Highest education level: Not on file  Occupational History   Occupation: disabled   Occupation:      Associate Professor: UNEMPLOYED  Tobacco Use   Smoking status: Former    Current packs/day: 0.00    Average packs/day: 1 pack/day for 25.0 years (25.0 ttl pk-yrs)    Types: Cigarettes    Start date: 03/27/1985    Quit date: 03/27/2010    Years since quitting: 13.1   Smokeless tobacco: Never   Tobacco comments:    Quit x 7 years  Vaping Use   Vaping status: Never Used  Substance and Sexual Activity   Alcohol use: Not Currently    Comment: occasionally   Drug use: Yes    Types: Marijuana    Comment: cocaine- last time used- 11/24/2017 , marijuana-    Sexual activity: Yes  Other Topics Concern   Not on file  Social History Narrative   He quit smoking in 2010. 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 He developed pulmonary problems, became disabled  because of lower airway disease in 2009.       WATCHES BASKETBALL. HIS TEAM IS [place].   Social Determinants of Health   Financial Resource Strain: Low Risk  (03/05/2023)   Received from [facility]   Overall Financial Resource Strain (CARDIA)    Difficulty of Paying Living Expenses: Not hard at all  Food Insecurity: No Food Insecurity (03/05/2023)   Received from [facility]   Hunger Vital Sign    Worried About Running Out of Food in the Last Year: Never true    Ran Out of Food in the Last Year: Never true  Transportation Needs: No Transportation Needs (03/12/2023)   Received from [facility]   SM SDOH Transportation Source    Has lack of transportation kept you from medical appointments or from getting medications?: No    Has lack of transportation kept you from meetings, work, or from getting things needed for daily living?: No  Physical Activity: Sufficiently Active (12/29/2021)   Exercise Vital Sign    Days of Exercise per Week: 3 days    Minutes of Exercise per Session: 60 min  Stress: Patient Declined (03/12/2023)   Received from [facility]   [employer] of Occupational Health - Occupational Stress Questionnaire    Feeling of Stress : Patient declined  Social Connections: Moderately Integrated (03/05/2023)   Received from [facility]   Social Connection and Isolation Panel [NHANES]    Frequency of Communication with Friends and Family: More than three times a  week    Frequency of Social Gatherings with Friends and Family: More than three times a week    Attends Religious Services: More than 4 times per year    Active Member of [employer] or Organizations: No    Attends Banker Meetings: Never    Marital Status: Married  Catering manager Violence: Not At Risk (03/02/2023)   Received from [facility]   Domestic Abuse Assessment    Do you feel safe in your relationships at home?: Yes    Physical Abuse: Denies    HRSN Domestic Abuse - Type of  Abuse: Not on file    HRSN Domestic Abuse - Time Frame: Not on file    HRSN Domestic Abuse - Signs and Symptoms: Not on file    Verbal Abuse: Denies    HRSN Domestic Abuse - Reported To: Not on file     Review of Systems   Gen: Denies any fever, chills, fatigue, weight loss, lack of appetite.  CV: Denies chest pain, heart palpitations, peripheral edema, syncope.  Resp: Denies shortness of breath at rest or with exertion. Denies wheezing or cough.  GI: Denies dysphagia or odynophagia. Denies jaundice, hematemesis, fecal incontinence. GU : Denies urinary burning, urinary frequency, urinary hesitancy MS: Denies joint pain, muscle weakness, cramps, or limitation of movement.  Derm: Denies rash, itching, dry skin Psych: Denies depression, anxiety, memory loss, and confusion Heme: Denies bruising, bleeding, and enlarged lymph nodes.   Physical Exam   BP 131/79 (BP Location: Right Arm, Patient Position: Sitting, Cuff Size: Large)   Pulse 83   Temp 97.8 F (36.6 C) (Temporal)   Ht 5\' 4"  (1.626 m)   Wt 215 lb 11.2 oz (97.8 kg)   BMI 37.02 kg/m  General:   Alert and oriented. Pleasant and cooperative. Well-nourished and well-developed.  Head:  Normocephalic and atraumatic. Eyes:  Without icterus Lungs: wheezing expiratory Abdomen:  +BS, soft, non-tender and non-distended. No HSM noted. No guarding or rebound. No masses appreciated.  Rectal:  no external hemorrhoids, DRE without mass  Msk:  Symmetrical without gross deformities. Normal posture. Extremities:  Without edema. Neurologic:  Alert and  oriented x4;  grossly normal neurologically. Skin:  Intact without significant lesions or rashes. Psych:  Alert and cooperative. Normal mood and affect.   Assessment   [name] [name] is a 72 y.o. male presenting today with a history of  GERD, anemia of chronic disease, symptomatic hemorrhoids, and now with imaging raising suspicion for cirrhosis, incidentally found while hospitalized  previously.  LFTs normal. Will check MELD labs and AFP. Discussed in depth with patient and wife cirrhosis care. Recommend EGD for variceal screening once cleared from cardiopulmonary status.   Possible gallbladder polyp on [place]. As we will be doing 6 month [place] for cirrhosis, we can continue to follow this.   Hemorrhoids: increase stool softener to BID. Avoid straining, limit toilet time. Not a banding candidate in light of cirrhosis. Anusol BID.      PLAN    MELD labs and AFP [place] in Feb 2025 Clearance for EGD from cardiopulmonary standpoint.  Hep A and B vaccinations 3 month follow-up regardless.    Gelene Mink, PhD, ANP-BC [facility] Gastroenterology   Addendum: need to request one time thorough serologies to rule out any other underlying liver disease that could have led to cirrhosis. Suspect due to [facility].   Gelene Mink, PhD, ANP-BC [facility] Gastroenterology

## 2023-05-17 ENCOUNTER — Ambulatory Visit: Payer: Medicare Other | Admitting: Vascular Surgery

## 2023-05-17 DIAGNOSIS — Z992 Dependence on renal dialysis: Secondary | ICD-10-CM | POA: Diagnosis not present

## 2023-05-17 DIAGNOSIS — D631 Anemia in chronic kidney disease: Secondary | ICD-10-CM | POA: Diagnosis not present

## 2023-05-17 DIAGNOSIS — Z23 Encounter for immunization: Secondary | ICD-10-CM | POA: Diagnosis not present

## 2023-05-17 DIAGNOSIS — Z1159 Encounter for screening for other viral diseases: Secondary | ICD-10-CM | POA: Diagnosis not present

## 2023-05-17 DIAGNOSIS — N186 End stage renal disease: Secondary | ICD-10-CM | POA: Diagnosis not present

## 2023-05-17 DIAGNOSIS — N250 Renal osteodystrophy: Secondary | ICD-10-CM | POA: Diagnosis not present

## 2023-05-17 LAB — COMPREHENSIVE METABOLIC PANEL
ALT: 30 [IU]/L (ref 0–44)
AST: 26 [IU]/L (ref 0–40)
Albumin: 3.7 g/dL — ABNORMAL LOW (ref 3.8–4.8)
Alkaline Phosphatase: 185 [IU]/L — ABNORMAL HIGH (ref 44–121)
BUN/Creatinine Ratio: 5 — ABNORMAL LOW (ref 10–24)
BUN: 40 mg/dL — ABNORMAL HIGH (ref 8–27)
Bilirubin Total: 0.6 mg/dL (ref 0.0–1.2)
CO2: 25 mmol/L (ref 20–29)
Calcium: 9.2 mg/dL (ref 8.6–10.2)
Chloride: 101 mmol/L (ref 96–106)
Creatinine, Ser: 7.62 mg/dL — ABNORMAL HIGH (ref 0.76–1.27)
Globulin, Total: 2.8 g/dL (ref 1.5–4.5)
Glucose: 101 mg/dL — ABNORMAL HIGH (ref 70–99)
Potassium: 4.6 mmol/L (ref 3.5–5.2)
Sodium: 144 mmol/L (ref 134–144)
Total Protein: 6.5 g/dL (ref 6.0–8.5)
eGFR: 7 mL/min/{1.73_m2} — ABNORMAL LOW (ref >59)

## 2023-05-17 LAB — AFP TUMOR MARKER: AFP, Serum, Tumor Marker: 2.9 ng/mL (ref 0.0–8.4)

## 2023-05-17 LAB — PROTIME-INR
INR: 1 (ref 0.9–1.2)
Prothrombin Time: 11.3 s (ref 9.1–12.0)

## 2023-05-17 NOTE — Telephone Encounter (Signed)
He needs to be seen in office by Dr Vassie Loll or APP to determine risk and clearance.

## 2023-05-18 DIAGNOSIS — Z992 Dependence on renal dialysis: Secondary | ICD-10-CM | POA: Diagnosis not present

## 2023-05-18 DIAGNOSIS — Z23 Encounter for immunization: Secondary | ICD-10-CM | POA: Diagnosis not present

## 2023-05-18 DIAGNOSIS — N2581 Secondary hyperparathyroidism of renal origin: Secondary | ICD-10-CM | POA: Diagnosis not present

## 2023-05-18 DIAGNOSIS — I132 Hypertensive heart and chronic kidney disease with heart failure and with stage 5 chronic kidney disease, or end stage renal disease: Secondary | ICD-10-CM | POA: Diagnosis not present

## 2023-05-18 DIAGNOSIS — D631 Anemia in chronic kidney disease: Secondary | ICD-10-CM | POA: Diagnosis not present

## 2023-05-18 DIAGNOSIS — I5022 Chronic systolic (congestive) heart failure: Secondary | ICD-10-CM | POA: Diagnosis not present

## 2023-05-18 DIAGNOSIS — E1122 Type 2 diabetes mellitus with diabetic chronic kidney disease: Secondary | ICD-10-CM | POA: Diagnosis not present

## 2023-05-18 DIAGNOSIS — N186 End stage renal disease: Secondary | ICD-10-CM | POA: Diagnosis not present

## 2023-05-18 DIAGNOSIS — G9341 Metabolic encephalopathy: Secondary | ICD-10-CM | POA: Diagnosis not present

## 2023-05-18 DIAGNOSIS — N250 Renal osteodystrophy: Secondary | ICD-10-CM | POA: Diagnosis not present

## 2023-05-18 DIAGNOSIS — A419 Sepsis, unspecified organism: Secondary | ICD-10-CM | POA: Diagnosis not present

## 2023-05-18 DIAGNOSIS — D509 Iron deficiency anemia, unspecified: Secondary | ICD-10-CM | POA: Diagnosis not present

## 2023-05-19 DIAGNOSIS — N186 End stage renal disease: Secondary | ICD-10-CM | POA: Diagnosis not present

## 2023-05-19 DIAGNOSIS — N250 Renal osteodystrophy: Secondary | ICD-10-CM | POA: Diagnosis not present

## 2023-05-19 DIAGNOSIS — Z23 Encounter for immunization: Secondary | ICD-10-CM | POA: Diagnosis not present

## 2023-05-19 DIAGNOSIS — D631 Anemia in chronic kidney disease: Secondary | ICD-10-CM | POA: Diagnosis not present

## 2023-05-19 DIAGNOSIS — Z992 Dependence on renal dialysis: Secondary | ICD-10-CM | POA: Diagnosis not present

## 2023-05-19 DIAGNOSIS — D509 Iron deficiency anemia, unspecified: Secondary | ICD-10-CM | POA: Diagnosis not present

## 2023-05-21 ENCOUNTER — Telehealth: Payer: Self-pay | Admitting: Pulmonary Disease

## 2023-05-21 DIAGNOSIS — Z992 Dependence on renal dialysis: Secondary | ICD-10-CM | POA: Diagnosis not present

## 2023-05-21 DIAGNOSIS — D509 Iron deficiency anemia, unspecified: Secondary | ICD-10-CM | POA: Diagnosis not present

## 2023-05-21 DIAGNOSIS — D631 Anemia in chronic kidney disease: Secondary | ICD-10-CM | POA: Diagnosis not present

## 2023-05-21 DIAGNOSIS — N186 End stage renal disease: Secondary | ICD-10-CM | POA: Diagnosis not present

## 2023-05-21 DIAGNOSIS — Z23 Encounter for immunization: Secondary | ICD-10-CM | POA: Diagnosis not present

## 2023-05-21 DIAGNOSIS — N250 Renal osteodystrophy: Secondary | ICD-10-CM | POA: Diagnosis not present

## 2023-05-21 NOTE — Telephone Encounter (Signed)
Patient was discharged from the hospital about 2 weeks

## 2023-05-21 NOTE — Telephone Encounter (Signed)
Left voicemail for patient to call back to schedule OV. 

## 2023-05-21 NOTE — Telephone Encounter (Signed)
Patient was discharged from the hospital 2 weeks ago and since then she has not been able to sleep at night because he cannot breath well----please call [phone]

## 2023-05-21 NOTE — Telephone Encounter (Signed)
Atc pt, no answer. Lvmm for pt to give the office a call back

## 2023-05-22 DIAGNOSIS — I132 Hypertensive heart and chronic kidney disease with heart failure and with stage 5 chronic kidney disease, or end stage renal disease: Secondary | ICD-10-CM | POA: Diagnosis not present

## 2023-05-22 DIAGNOSIS — A419 Sepsis, unspecified organism: Secondary | ICD-10-CM | POA: Diagnosis not present

## 2023-05-22 DIAGNOSIS — Z23 Encounter for immunization: Secondary | ICD-10-CM | POA: Diagnosis not present

## 2023-05-22 DIAGNOSIS — G9341 Metabolic encephalopathy: Secondary | ICD-10-CM | POA: Diagnosis not present

## 2023-05-22 DIAGNOSIS — N186 End stage renal disease: Secondary | ICD-10-CM | POA: Diagnosis not present

## 2023-05-22 DIAGNOSIS — E1122 Type 2 diabetes mellitus with diabetic chronic kidney disease: Secondary | ICD-10-CM | POA: Diagnosis not present

## 2023-05-22 DIAGNOSIS — I5022 Chronic systolic (congestive) heart failure: Secondary | ICD-10-CM | POA: Diagnosis not present

## 2023-05-23 ENCOUNTER — Other Ambulatory Visit (HOSPITAL_COMMUNITY): Payer: Self-pay | Admitting: Nurse Practitioner

## 2023-05-23 ENCOUNTER — Ambulatory Visit (HOSPITAL_COMMUNITY)
Admission: RE | Admit: 2023-05-23 | Discharge: 2023-05-23 | Disposition: A | Discharge status: Home / Self Care | Payer: Medicare Other | Source: Ambulatory Visit | Attending: Nurse Practitioner | Admitting: Nurse Practitioner

## 2023-05-23 DIAGNOSIS — N186 End stage renal disease: Secondary | ICD-10-CM | POA: Diagnosis not present

## 2023-05-23 DIAGNOSIS — R0602 Shortness of breath: Secondary | ICD-10-CM | POA: Diagnosis not present

## 2023-05-23 DIAGNOSIS — I517 Cardiomegaly: Secondary | ICD-10-CM | POA: Diagnosis not present

## 2023-05-23 DIAGNOSIS — N250 Renal osteodystrophy: Secondary | ICD-10-CM | POA: Diagnosis not present

## 2023-05-23 DIAGNOSIS — Z23 Encounter for immunization: Secondary | ICD-10-CM | POA: Diagnosis not present

## 2023-05-23 DIAGNOSIS — D509 Iron deficiency anemia, unspecified: Secondary | ICD-10-CM | POA: Diagnosis not present

## 2023-05-23 DIAGNOSIS — D631 Anemia in chronic kidney disease: Secondary | ICD-10-CM | POA: Diagnosis not present

## 2023-05-23 DIAGNOSIS — R918 Other nonspecific abnormal finding of lung field: Secondary | ICD-10-CM | POA: Diagnosis not present

## 2023-05-23 DIAGNOSIS — I700 Atherosclerosis of aorta: Secondary | ICD-10-CM | POA: Diagnosis not present

## 2023-05-23 DIAGNOSIS — Z992 Dependence on renal dialysis: Secondary | ICD-10-CM | POA: Diagnosis not present

## 2023-05-24 NOTE — Telephone Encounter (Signed)
ATC x2.  LVM to return call.  Patient does not have an active mychart account.

## 2023-05-25 ENCOUNTER — Telehealth: Payer: Self-pay | Admitting: *Deleted

## 2023-05-25 ENCOUNTER — Encounter: Payer: Self-pay | Admitting: Internal Medicine

## 2023-05-25 ENCOUNTER — Ambulatory Visit: Payer: Medicare Other | Attending: Internal Medicine | Admitting: Internal Medicine

## 2023-05-25 VITALS — BP 132/78 | HR 62 | Ht 64.0 in | Wt 210.4 lb

## 2023-05-25 DIAGNOSIS — I10 Essential (primary) hypertension: Secondary | ICD-10-CM | POA: Diagnosis not present

## 2023-05-25 DIAGNOSIS — N250 Renal osteodystrophy: Secondary | ICD-10-CM | POA: Diagnosis not present

## 2023-05-25 DIAGNOSIS — I2510 Atherosclerotic heart disease of native coronary artery without angina pectoris: Secondary | ICD-10-CM | POA: Insufficient documentation

## 2023-05-25 DIAGNOSIS — I441 Atrioventricular block, second degree: Secondary | ICD-10-CM | POA: Diagnosis not present

## 2023-05-25 DIAGNOSIS — I4430 Unspecified atrioventricular block: Secondary | ICD-10-CM | POA: Insufficient documentation

## 2023-05-25 DIAGNOSIS — D509 Iron deficiency anemia, unspecified: Secondary | ICD-10-CM | POA: Diagnosis not present

## 2023-05-25 DIAGNOSIS — D631 Anemia in chronic kidney disease: Secondary | ICD-10-CM | POA: Diagnosis not present

## 2023-05-25 DIAGNOSIS — N186 End stage renal disease: Secondary | ICD-10-CM | POA: Insufficient documentation

## 2023-05-25 DIAGNOSIS — I25118 Atherosclerotic heart disease of native coronary artery with other forms of angina pectoris: Secondary | ICD-10-CM

## 2023-05-25 DIAGNOSIS — I200 Unstable angina: Secondary | ICD-10-CM

## 2023-05-25 DIAGNOSIS — Z992 Dependence on renal dialysis: Secondary | ICD-10-CM | POA: Diagnosis not present

## 2023-05-25 DIAGNOSIS — Z23 Encounter for immunization: Secondary | ICD-10-CM | POA: Diagnosis not present

## 2023-05-25 MED ORDER — AZITHROMYCIN 250 MG PO TABS: ORAL_TABLET | ORAL | 0 refills | Status: DC

## 2023-05-25 NOTE — Telephone Encounter (Signed)
I'll send in abx for asthmatic bronchitis symptoms. Take mucinex for 5 days. Try using nebulizer. If not better needs CXR
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
## 2023-05-25 NOTE — Telephone Encounter (Signed)
Called and spoke with patient, he was at dialysis at the time of my call.  He states he is continuing to have sob, he is wondering it it is his asthma.  He is using Breo 1 puff daily and albuterol every 6 hours. He is getting relief with the albuterol.  He does have a nebulizer machine and albuterol neb solution, however, he has not used it.  He has been coughing up green mucous for about a month.  No fever, chills or body aches.  Having to sleep sitting up. Advised I would get a message to a provider and then one of [place] would call him back with recommendations.  Beth, please advise.  Thank you.

## 2023-05-25 NOTE — Telephone Encounter (Signed)
Patient is requesting to be seen in [place] office.  I advised him that Dr. Vassie Loll is no longer seeing patients in the [facility] office, the provider is Dr. Sherene Sires.  He is agreeable to seeing Dr. Sherene Sires in the [place] office.  Dr. Vassie Loll and Dr. Sherene Sires, please advise if the change in provider is ok.  Thank you.

## 2023-05-25 NOTE — Telephone Encounter (Signed)
Called patient to make aware of rx being sent. Patient states all respiratory meds should go to [facility]. Rx resent.

## 2023-05-25 NOTE — Telephone Encounter (Signed)
I can see him for anything no sleep related (to NP for that) but make sure 30 min slow and he brings all meds

## 2023-05-25 NOTE — Telephone Encounter (Signed)
[facility] does not treat OSA, patient can choose one that treats both his asthma and OSA, or will need 2 separate providers. Thanks!

## 2023-05-25 NOTE — Patient Instructions (Signed)
Medication Instructions:  NO CHANGES  *If you need a refill on your cardiac medications before your next appointment, please call your pharmacy*    Follow-Up: At [facility], you and your health needs are our priority.  As part of our continuing mission to provide you with exceptional heart care, we have created designated Provider Care Teams.  These Care Teams include your primary Cardiologist (physician) and Advanced Practice Providers (APPs -  Physician Assistants and Nurse Practitioners) who all work together to provide you with the care you need, when you need it.  We recommend signing up for the patient portal called "MyChart".  Sign up information is provided on this After Visit Summary.  MyChart is used to connect with patients for Virtual Visits (Telemedicine).  Patients are able to view lab/test results, encounter notes, upcoming appointments, etc.  Non-urgent messages can be sent to your provider as well.   To learn more about what you can do with MyChart, go to [email or web address].    Your next appointment:    6 months with Dr. Debara Pickett

## 2023-05-28 ENCOUNTER — Encounter
Payer: Medicare Other | Attending: Physical Medicine and Rehabilitation | Admitting: Physical Medicine and Rehabilitation

## 2023-05-28 ENCOUNTER — Encounter: Payer: Self-pay | Admitting: Physical Medicine and Rehabilitation

## 2023-05-28 VITALS — BP 121/75 | HR 69 | Ht 64.0 in | Wt 212.0 lb

## 2023-05-28 DIAGNOSIS — G4733 Obstructive sleep apnea (adult) (pediatric): Secondary | ICD-10-CM | POA: Diagnosis not present

## 2023-05-28 DIAGNOSIS — Z992 Dependence on renal dialysis: Secondary | ICD-10-CM | POA: Diagnosis not present

## 2023-05-28 DIAGNOSIS — F0282 Dementia in other diseases classified elsewhere, unspecified severity, with psychotic disturbance: Secondary | ICD-10-CM | POA: Diagnosis not present

## 2023-05-28 DIAGNOSIS — D509 Iron deficiency anemia, unspecified: Secondary | ICD-10-CM | POA: Diagnosis not present

## 2023-05-28 DIAGNOSIS — R5381 Other malaise: Secondary | ICD-10-CM | POA: Insufficient documentation

## 2023-05-28 DIAGNOSIS — D631 Anemia in chronic kidney disease: Secondary | ICD-10-CM | POA: Diagnosis not present

## 2023-05-28 DIAGNOSIS — F0392 Unspecified dementia, unspecified severity, with psychotic disturbance: Secondary | ICD-10-CM | POA: Insufficient documentation

## 2023-05-28 DIAGNOSIS — N250 Renal osteodystrophy: Secondary | ICD-10-CM | POA: Diagnosis not present

## 2023-05-28 DIAGNOSIS — N186 End stage renal disease: Secondary | ICD-10-CM | POA: Diagnosis not present

## 2023-05-28 DIAGNOSIS — Z23 Encounter for immunization: Secondary | ICD-10-CM | POA: Diagnosis not present

## 2023-05-28 NOTE — Patient Instructions (Signed)
I recommend refraining from driving until you get an OT driving evaluation; paper given on resources today.  Follow up with [facility] for memory issues and Pulmonology/Sleep medicine for CPAP titration.  You are OK to stop gabapentin, ativan, and trazodone.   All other medications to be refilled through his PCP.  Follow up as needed.

## 2023-05-28 NOTE — Progress Notes (Signed)
Subjective:    Patient ID: [name] [name], male    DOB: 12-Oct-1950, 72 y.o.   MRN: [id number]  HPI  [name] [name] is a 72 y.o. year old male  who  has a past medical history of Anemia, Arthritis, Asthma, Bell palsy, CAD (coronary artery disease), Chronic back pain, Chronic knee pain, Chronic pain, Chronic shoulder pain, Chronic sinusitis, COPD (chronic obstructive pulmonary disease) (HCC), Diabetes mellitus without complication (HCC), ESRD on peritoneal dialysis (HCC), Essential hypertension, GERD (gastroesophageal reflux disease), Gout, Gout, Hepatomegaly, History of hiatal hernia, Hyperlipidemia, Lateral meniscus tear, Obesity, Obstructive sleep apnea, On home oxygen therapy, PUD (peptic ulcer disease), Reactive airway disease, Sinusitis, and Vitamin D deficiency.   They are presenting to PM&R clinic for follow up related to IPR admission 8/26-10/21/24 for metabolic encephalopathy s/p prolonged hospitalization for bacterial peritonitis/sepsis due to infected PD catheter, c/b likely overlying dementia.   Interval Hx:  - Therapies: HH PT and OT, just started. Coming once per week.    - Follow ups: Already saw cardiology and PCP. Been going to dialysis at [facility], no missed sessions, without issue.    - Falls: none; no near misses.   - DME: Using a walker at home and a cane; using a walker in the community mostly with a WC occasionally for appointments.    - Medications: He has not needed ativan and is running out of gabapentin, which his wife does not feel is helpful.    - Other concerns: Has not been sleeping; stopped Trazadone because it wasn't working. He says he feels like he loses his breath, which wakes him up. Has been intermittently compliant with his CPAP per his wife. She also thinks he eats heavy meals before bedtime, which causes reflux and may contribute to his shortness of breath. He says the CPAP feels like it's smothering him; has tried every mask since 2001 without much  improvement.   He has been driving, has had no concerns with this or close calls.   She says since the hospital, "I can't do anything right".   Pain Inventory Average Pain 5 Pain Right Now 5 My pain is dull, tingling, and aching  LOCATION OF PAIN  shoulder back knee and ankle  BOWEL Number of stools per week: 14  BLADDER Dialysis   Mobility use a walker how many minutes can you walk? 10 ability to climb steps?  yes do you drive?  no needs help with transfers  Function retired I need assistance with the following:  bathing, meal prep, household duties, and shopping  Neuro/Psych weakness tremor trouble walking  Prior Studies Any changes since last visit?  no  Physicians involved in your care Any changes since last visit?  no  jas seen his cardiologist and PCP. He had dialysis this morning   Family History  Problem Relation Age of Onset   Hypertension Mother        MI   Cancer Mother        breast    Diabetes Mother    Diabetes Father    Hypertension Father    Hypertension Sister    Diabetes Sister    Arthritis Other    Asthma Other    Lung disease Other    Anesthesia problems Neg Hx    Hypotension Neg Hx    Malignant hyperthermia Neg Hx    Pseudochol deficiency Neg Hx    Colon cancer Neg Hx    Social History   Socioeconomic History  Marital status: Married    Spouse name: Not on file   Number of children: 2   Years of education: 12th grade   Highest education level: Not on file  Occupational History   Occupation: disabled   Occupation:      Associate Professor: UNEMPLOYED  Tobacco Use   Smoking status: Former    Current packs/day: 0.00    Average packs/day: 1 pack/day for 25.0 years (25.0 ttl pk-yrs)    Types: Cigarettes    Start date: 03/27/1985    Quit date: 03/27/2010    Years since quitting: 13.1   Smokeless tobacco: Never   Tobacco comments:    Quit x 7 years  Vaping Use   Vaping status: Never Used  Substance and Sexual Activity    Alcohol use: Not Currently    Comment: occasionally   Drug use: Yes    Types: Marijuana    Comment: cocaine- last time used- 11/24/2017 , marijuana-    Sexual activity: Yes  Other Topics Concern   Not on file  Social History Narrative   He quit smoking in 2010. 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 He developed pulmonary problems, became disabled because of lower airway disease in 2009.       WATCHES BASKETBALL. HIS TEAM IS [place].   Social Determinants of Health   Financial Resource Strain: Low Risk  (03/05/2023)   Received from [facility]   Overall Financial Resource Strain (CARDIA)    Difficulty of Paying Living Expenses: Not hard at all  Food Insecurity: No Food Insecurity (03/05/2023)   Received from [facility]   Hunger Vital Sign    Worried About Running Out of Food in the Last Year: Never true    Ran Out of Food in the Last Year: Never true  Transportation Needs: No Transportation Needs (03/12/2023)   Received from [facility]   SM SDOH Transportation Source    Has lack of transportation kept you from medical appointments or from getting medications?: No    Has lack of transportation kept you from meetings, work, or from getting things needed for daily living?: No  Physical Activity: Sufficiently Active (12/29/2021)   Exercise Vital Sign    Days of Exercise per Week: 3 days    Minutes of Exercise per Session: 60 min  Stress: Patient Declined (03/12/2023)   Received from [facility]   [employer] of Occupational Health - Occupational Stress Questionnaire    Feeling of Stress : Patient declined  Social Connections: Moderately Integrated (03/05/2023)   Received from [facility]   Social Connection and Isolation Panel [NHANES]    Frequency of Communication with Friends and Family: More than three times a week    Frequency of Social Gatherings with Friends and Family: More than three times a week    Attends  Religious Services: More than 4 times per year    Active Member of Clubs or Organizations: No    Attends Banker Meetings: Never    Marital Status: Married   Past Surgical History:  Procedure Laterality Date   A/V FISTULAGRAM N/A 02/28/2023   Procedure: A/V Fistulagram;  Surgeon: Victorino Sparrow, MD;  Location: [facility] INVASIVE CV LAB;  Service: Cardiovascular;  Laterality: N/A;   ASAD LT SHOULDER  12/15/2008   left shoulder   AV FISTULA PLACEMENT Left 08/09/2016   Procedure: BRACHIOCEPHALIC ARTERIOVENOUS (AV) FISTULA CREATION LEFT ARM;  Surgeon: Sherren Kerns, MD;  Location: [facility] OR;  Service: Vascular;  Laterality: Left;   CAPD INSERTION N/A 10/07/2018   Procedure: LAPAROSCOPIC PERITONEAL CATHETER PLACEMENT;  Surgeon: Sheliah Hatch De Blanch, MD;  Location: [facility] ORS;  Service: General;  Laterality: N/A;   CATARACT EXTRACTION W/PHACO Left 03/28/2016   Procedure: CATARACT EXTRACTION PHACO AND INTRAOCULAR LENS PLACEMENT LEFT EYE;  Surgeon: Jethro Bolus, MD;  Location: [facility] ORS;  Service: Ophthalmology;  Laterality: Left;  CDE: 4.77   CATARACT EXTRACTION W/PHACO Right 04/11/2016   Procedure: CATARACT EXTRACTION PHACO AND INTRAOCULAR LENS PLACEMENT RIGHT EYE; CDE:  4.74;  Surgeon: Jethro Bolus, MD;  Location: [facility] ORS;  Service: Ophthalmology;  Laterality: Right;   COLONOSCOPY  10/15/2008   Fields: Rectal polyp obliterated, not retrieved, hemorrhoids, single ascending colon diverticulum near the CV. Next colonoscopy April 2020   COLONOSCOPY N/A 12/25/2014   SLF: 1. Colorectal polyps (2) removed 2. Small internal hemorrhoids 3. the left colon is severely redundant. hyperplastic polyps   CORONARY ARTERY BYPASS GRAFT N/A 06/05/2022   Procedure: OFF PUMP CORONARY ARTERY BYPASS GRAFTING (CABG) X 2 BYPASSES USING LEFT INTERNAL MAMMARY ARTERY AND RIGHT LEG GREATER SAPHENOUS VEIN HARVESTED ENDOSCOPICALLY;  Surgeon: Corliss Skains, MD;  Location: [facility] OR;  Service: Open Heart Surgery;  Laterality:  N/A;   CORONARY STENT INTERVENTION N/A 07/25/2021   Procedure: CORONARY STENT INTERVENTION;  Surgeon: Tonny Bollman, MD;  Location: [facility] INVASIVE CV LAB;  Service: Cardiovascular;  Laterality: N/A;   CORONARY STENT INTERVENTION N/A 12/26/2021   Procedure: CORONARY STENT INTERVENTION;  Surgeon: Yvonne Kendall, MD;  Location: [facility] INVASIVE CV LAB;  Service: Cardiovascular;  Laterality: N/A;   CORONARY STENT INTERVENTION N/A 01/20/2022   Procedure: CORONARY STENT INTERVENTION;  Surgeon: Tonny Bollman, MD;  Location: [facility] INVASIVE CV LAB;  Service: Cardiovascular;  Laterality: N/A;   DOPPLER ECHOCARDIOGRAPHY     ESOPHAGOGASTRODUODENOSCOPY N/A 12/25/2014   SLF: 1. Anemia most likely due to CRI, gastritis, gastric polyps 2. Moderate non-erosive gastriits and mild duodenitis.  3.TWo large gstric polyps removed.    EYE SURGERY  12/22/2010   tear duct probing-[place]   FOREIGN BODY REMOVAL  03/29/2011   Procedure: REMOVAL FOREIGN BODY EXTREMITY;  Surgeon: Fuller Canada, MD;  Location: [facility]S;  Service: Orthopedics;  Laterality: Right;  Removal Foreign Body Right Thumb   IR FLUORO GUIDE CV LINE RIGHT  08/06/2018   IR FLUORO GUIDE CV LINE RIGHT  02/17/2023   IR US GUIDE VASC ACCESS RIGHT  08/06/2018   IR US GUIDE VASC ACCESS RIGHT  02/17/2023   KNEE ARTHROSCOPY  10/16/2007   left   KNEE ARTHROSCOPY WITH LATERAL MENISECTOMY Right 10/14/2015   Procedure: LEFT KNEE ARTHROSCOPY WITH PARTIAL LATERAL MENISECTOMY;  Surgeon: Vickki Hearing, MD;  Location: [facility]S;  Service: Orthopedics;  Laterality: Right;   LEFT HEART CATH AND CORONARY ANGIOGRAPHY N/A 07/25/2021   Procedure: LEFT HEART CATH AND CORONARY ANGIOGRAPHY;  Surgeon: Tonny Bollman, MD;  Location: [facility] INVASIVE CV LAB;  Service: Cardiovascular;  Laterality: N/A;   LEFT HEART CATH AND CORONARY ANGIOGRAPHY N/A 12/26/2021   Procedure: LEFT HEART CATH AND CORONARY ANGIOGRAPHY;  Surgeon: Yvonne Kendall, MD;  Location: [facility] INVASIVE CV LAB;  Service:  Cardiovascular;  Laterality: N/A;   LEFT HEART CATH AND CORONARY ANGIOGRAPHY N/A 01/20/2022   Procedure: LEFT HEART CATH AND CORONARY ANGIOGRAPHY;  Surgeon: Tonny Bollman, MD;  Location: [facility] INVASIVE CV LAB;  Service: Cardiovascular;  Laterality: N/A;   LEFT HEART CATHETERIZATION WITH CORONARY ANGIOGRAM N/A 03/28/2013   Procedure: LEFT HEART CATHETERIZATION WITH CORONARY ANGIOGRAM;  Surgeon:  Marykay Lex, MD;  Location: [facility] CATH LAB;  Service: Cardiovascular;  Laterality: N/A;   NM MYOVIEW LTD     PENILE PROSTHESIS IMPLANT N/A 08/16/2015   Procedure: PENILE PROTHESIS INFLATABLE, three piece, Excisional biopsy of Penile ulcer, Penile molding;  Surgeon: Jethro Bolus, MD;  Location: [facility]S;  Service: Urology;  Laterality: N/A;   PENILE PROSTHESIS IMPLANT N/A 12/24/2017   Procedure: REMOVAL AND  REPLACEMENT  COLOPLAST PENILE PROSTHESIS;  Surgeon: Crista Elliot, MD;  Location: [facility]S;  Service: Urology;  Laterality: N/A;   PERIPHERAL VASCULAR BALLOON ANGIOPLASTY  02/28/2023   Procedure: PERIPHERAL VASCULAR BALLOON ANGIOPLASTY;  Surgeon: Victorino Sparrow, MD;  Location: [facility] INVASIVE CV LAB;  Service: Cardiovascular;;   PORT-A-CATH REMOVAL N/A 02/19/2023   Procedure: REMOVAL OF PERITONEAL DIALYSIS CATHETER;  Surgeon: Victorino Sparrow, MD;  Location: [facility] OR;  Service: Vascular;  Laterality: N/A;   QUADRICEPS TENDON REPAIR  07/21/2011   Procedure: REPAIR QUADRICEP TENDON;  Surgeon: Fuller Canada, MD;  Location: [facility];  Service: Orthopedics;  Laterality: Right;   RIGHT/LEFT HEART CATH AND CORONARY ANGIOGRAPHY N/A 05/30/2022   Procedure: RIGHT/LEFT HEART CATH AND CORONARY ANGIOGRAPHY;  Surgeon: Corky Crafts, MD;  Location: [facility] INVASIVE CV LAB;  Service: Cardiovascular;  Laterality: N/A;   TEE WITHOUT CARDIOVERSION N/A 06/05/2022   Procedure: TRANSESOPHAGEAL ECHOCARDIOGRAM (TEE);  Surgeon: Corliss Skains, MD;  Location: [facility] OR;  Service: Open Heart Surgery;  Laterality: N/A;   TOENAIL  EXCISION     removed x2-bilateral   UMBILICAL HERNIA REPAIR  07/17/2005   [place]   Past Medical History:  Diagnosis Date   Anemia    Arthritis    Asthma    Bell palsy    CAD (coronary artery disease)    a. 2014 MV: abnl w/ infap ischemia; b. 03/2013 Cath: aneurysmal bleb in the LAD w/ otw nonobs dzs-->Med Rx.   Chronic back pain    Chronic knee pain    a. 09/2015 s/p R TKA.   Chronic pain    Chronic shoulder pain    Chronic sinusitis    COPD (chronic obstructive pulmonary disease) (HCC)    Diabetes mellitus without complication (HCC)    type II    ESRD on peritoneal dialysis (HCC)    on peritoneal dialysis, [facility]   Essential hypertension    GERD (gastroesophageal reflux disease)    Gout    Gout    Hepatomegaly    noted on noncontrast CT 2015   History of hiatal hernia    Hyperlipidemia    Lateral meniscus tear    Obesity    Truncal   Obstructive sleep apnea    does not use cpap    On home oxygen therapy    uses 2l when is going somewhere per patient    PUD (peptic ulcer disease)    remote, reports f/u EGD about 8 years ago unremarkable    Reactive airway disease    related to exposure to chemical during 9/11   Sinusitis    Vitamin D deficiency    BP 121/75   Pulse 69   Ht 5\' 4"  (1.626 m)   Wt 212 lb (96.2 kg)   SpO2 94%   BMI 36.39 kg/m   Opioid Risk Score:   Fall Risk Score:  `1  Depression screen [facility] 2/9     05/28/2023   12:21 PM 01/03/2023   10:23 AM 08/08/2022    8:42 AM 02/01/2022    4:13  PM 12/29/2021    1:38 PM 09/29/2021    2:10 PM 03/07/2018    9:54 AM  Depression screen PHQ 2/9  Decreased Interest 3 3 0 0 0 0 0  Down, Depressed, Hopeless 0 0 1 0 1 0   PHQ - 2 Score 3 3 1  0 1 0 0  Altered sleeping 3 3 0   0   Tired, decreased energy 3 3 3   3    Change in appetite 0 2 2   1    Feeling bad or failure about yourself  0 0 0   2   Trouble concentrating 0 0 0   1   Moving slowly or fidgety/restless 0 0 0   0   Suicidal thoughts 0 0  0   0   PHQ-9 Score 9 11 6   7    Difficult doing work/chores  Somewhat difficult Somewhat difficult   Somewhat difficult      Review of Systems  Constitutional: Negative.   HENT: Negative.    Eyes: Negative.   Respiratory:  Positive for apnea, shortness of breath and wheezing.   Cardiovascular:  Positive for leg swelling.  Gastrointestinal: Negative.   Endocrine: Negative.   Genitourinary:        Dialysis--shunt in left arm  Musculoskeletal:  Positive for gait problem.       Spasms  Skin: Negative.   Allergic/Immunologic: Negative.   Neurological:  Positive for tremors and weakness.  Hematological: Negative.   Psychiatric/Behavioral:  Positive for dysphoric mood.   All other systems reviewed and are negative.      Objective:   Physical Exam   PE: Constitution: Appropriate appearance for age. No apparent distress +Obese Resp: No respiratory distress. No accessory muscle usage. on RA and CTAB Cardio: Well perfused appearance. +2 LE edema bilaterally,  Abdomen: Nondistended. Nontender. + scar tissue palpable in LLQ over prior PD site.  Psych: Appropriate mood and affect. Neuro: AAOx4. Mild cognitive deficits - remembers 2/3 items at 5 minutes, can add change and spell world backwards.   Neurologic Exam:   DTRs: Reflexes were 2+ in bilateral achilles, patella, biceps, BR and triceps. Babinsky: flexor responses b/l.   Hoffmans: negative b/l Sensory exam: revealed normal sensation in all dermatomal regions in bilateral upper extremities and bilateral lower extremities Motor exam: strength 4/5 throughout bilateral upper extremities and bilateral lower extremities Coordination: R>L UE intention tremor - mild. No ataxia.  Gait: not observed due to safety concerns         Assessment & Plan:   [name] [name] is a 72 y.o. year old male  who  has a past medical history of Anemia, Arthritis, Asthma, Bell palsy, CAD (coronary artery disease), Chronic back pain, Chronic knee  pain, Chronic pain, Chronic shoulder pain, Chronic sinusitis, COPD (chronic obstructive pulmonary disease) (HCC), Diabetes mellitus without complication (HCC), ESRD on peritoneal dialysis (HCC), Essential hypertension, GERD (gastroesophageal reflux disease), Gout, Gout, Hepatomegaly, History of hiatal hernia, Hyperlipidemia, Lateral meniscus tear, Obesity, Obstructive sleep apnea, On home oxygen therapy, PUD (peptic ulcer disease), Reactive airway disease, Sinusitis, and Vitamin D deficiency.  They are presenting to PM&R clinic for follow up related to IPR admission 8/26-10/21/24 for metabolic encephalopathy s/p prolonged hospitalization for bacterial peritonitis/sepsis due to infected PD catheter, c/b likely overlying dementia.   Dementia associated with other underlying disease, with psychotic disturbance, unspecified dementia severity (HCC) Debility Doing well, continue HH PT/OT  I recommended refraining from driving until you get an OT driving evaluation;  paper given on resources today. Warned him that continuing to drive without evaluation puts him and others at risk of permanent disability and death.    Follow up with Neurology for memory issues and   You are OK to stop gabapentin, ativan, and trazodone.   All other medications to be refilled through his PCP.  Follow up as needed.   OSA on CPAP Follow up with Pulmonology/Sleep medicine for CPAP titration.

## 2023-05-29 DIAGNOSIS — E1122 Type 2 diabetes mellitus with diabetic chronic kidney disease: Secondary | ICD-10-CM | POA: Diagnosis not present

## 2023-05-29 DIAGNOSIS — I132 Hypertensive heart and chronic kidney disease with heart failure and with stage 5 chronic kidney disease, or end stage renal disease: Secondary | ICD-10-CM | POA: Diagnosis not present

## 2023-05-29 DIAGNOSIS — I5022 Chronic systolic (congestive) heart failure: Secondary | ICD-10-CM | POA: Diagnosis not present

## 2023-05-29 DIAGNOSIS — G9341 Metabolic encephalopathy: Secondary | ICD-10-CM | POA: Diagnosis not present

## 2023-05-29 DIAGNOSIS — N186 End stage renal disease: Secondary | ICD-10-CM | POA: Diagnosis not present

## 2023-05-29 DIAGNOSIS — A419 Sepsis, unspecified organism: Secondary | ICD-10-CM | POA: Diagnosis not present

## 2023-05-29 NOTE — Telephone Encounter (Signed)
Please contact patient to see which route he would prefer. Thanks!

## 2023-05-29 NOTE — Progress Notes (Unsigned)
Electrophysiology Office Note:   Date:  05/30/2023  ID:  OBRYANT BOREY, DOB 09-01-1950, MRN [id number]  Primary Cardiologist: Chrystie Nose, MD Electrophysiologist: Regan Lemming, MD      History of Present Illness:   [name] [name] is a 72 y.o. male with h/o CAD s/p CABG, 1st/2nd degree AVB post CABG, HTN, HLD, ESRD on PD, COPD on 2L O2  seen today for routine electrophysiology followup.   Admitted from 8/26-10/21/24 due to metabolic encephalopathy thought in setting of recent COVID infection, multifocal PNA, PD catheter malfunction with associated sepsis due to PD catheter peritonitis, polymicrobial bacteremia/fungemia (BC+ for candida glabrata, enterococcus avium, staph haemolyticus from anaerobic bottle > repeat cultures negative). He required temporary HD catheter placement due to PD cath infection.  He was transferred to ICU.  Required fistulogram per [facility]. During hospitalization, he required Geodon & Zyprexa for agitated delirium. He had a 16 beat run of WCT on 8/18. Later in hospitalization, he had Mobitz I & incomplete LBBB.  He was transferred to [facility] > reportedly had paroxysmal AF on Eliquis, Mobitz 1 AVB.  No clear AF identified and OAC was stopped.   Since last being seen in our clinic the patient reports he is doing so much better post hospitalization.  He notes he does not remember much of the above admission.     He denies chest pain, palpitations, dyspnea, PND, orthopnea, nausea, vomiting, dizziness, syncope, edema, weight gain, or early satiety.   Review of systems complete and found to be negative unless listed in HPI.   EP Information / Studies Reviewed:    EKG is ordered today. Personal review as below.  EKG Interpretation Date/Time:  Wednesday May 30 2023 14:26:21 EST Ventricular Rate:  59 PR Interval:    QRS Duration:  112 QT Interval:  452 QTC Calculation: 447 R Axis:   -15  Text Interpretation: SR with variable PR, conducted and non-conducted  PAC's Incomplete left bundle branch block Confirmed by Canary Brim ([id number]) on 05/30/2023 3:20:07 PM   Studies:  ECHO 02/2023 > LVEF 40-45%, akinesis of the anterior / anteroseptal / inferoseptal wall from base to apex, RV systolic function moderately reduced, LA mildly dilated    Arrhythmia / AAD Mobitz 1 with 2:1, known nocturnal CHB   Risk Assessment/Calculations:              Physical Exam:   VS:  BP 110/70   Pulse (!) 59   Ht 5\' 4"  (1.626 m)   Wt 213 lb 6.4 oz (96.8 kg)   SpO2 99%   BMI 36.63 kg/m    Wt Readings from Last 3 Encounters:  05/30/23 213 lb 6.4 oz (96.8 kg)  05/28/23 212 lb (96.2 kg)  05/25/23 210 lb 6.4 oz (95.4 kg)     GEN: Well nourished, well developed in no acute distress NECK: No JVD; No carotid bruits CARDIAC: Regular rate and rhythm with occasional ectopy, no murmurs, rubs, gallops RESPIRATORY:  Clear to auscultation without rales, wheezing or rhonchi  ABDOMEN: Soft, non-tender, non-distended EXTREMITIES:  No edema; No deformity   ASSESSMENT AND PLAN:    Advanced AV Block  Post CABG in 05/2022, had Mobitz 1 with 2:1 and known nocturnal CHB at baseline.  -avoid AV nodal blocking agents -EKG with SR, variable PR with conducted and non-conducted PAC's, junctional beats -follow up EKG in 6 months to review for any progression of conduction system disease -given his co-morbidities, he would be a poor transvenous pacing candidate. Would  need to consider leadless if pacing needed in future.   CAD s/p CABG  -denies anginal symptoms > had episode of right sided chest discomfort on 11/12 that resolved spontaneously and felt like indigestion  -continue ASA   Hypertension  -well controlled on current regimen    ESRD  -previously on PD at home before Aug-Oct 2024 hospitalization, now has AVF LUE on HD M/W/F  Follow up with Dr. Elberta Fortis or EP APP in 6 months  Signed, Canary Brim, MSN, APRN, NP-C, AGACNP-BC [facility] - Electrophysiology   05/30/2023, 3:20 PM

## 2023-05-30 ENCOUNTER — Encounter: Payer: Self-pay | Admitting: Pulmonary Disease

## 2023-05-30 ENCOUNTER — Ambulatory Visit: Payer: Medicare Other | Attending: Cardiology | Admitting: Pulmonary Disease

## 2023-05-30 VITALS — BP 110/70 | HR 59 | Ht 64.0 in | Wt 213.4 lb

## 2023-05-30 DIAGNOSIS — I2510 Atherosclerotic heart disease of native coronary artery without angina pectoris: Secondary | ICD-10-CM | POA: Insufficient documentation

## 2023-05-30 DIAGNOSIS — I441 Atrioventricular block, second degree: Secondary | ICD-10-CM | POA: Insufficient documentation

## 2023-05-30 DIAGNOSIS — I25118 Atherosclerotic heart disease of native coronary artery with other forms of angina pectoris: Secondary | ICD-10-CM | POA: Insufficient documentation

## 2023-05-30 DIAGNOSIS — Z951 Presence of aortocoronary bypass graft: Secondary | ICD-10-CM | POA: Diagnosis not present

## 2023-05-30 DIAGNOSIS — N186 End stage renal disease: Secondary | ICD-10-CM | POA: Insufficient documentation

## 2023-05-30 DIAGNOSIS — N250 Renal osteodystrophy: Secondary | ICD-10-CM | POA: Diagnosis not present

## 2023-05-30 DIAGNOSIS — D631 Anemia in chronic kidney disease: Secondary | ICD-10-CM | POA: Diagnosis not present

## 2023-05-30 DIAGNOSIS — Z23 Encounter for immunization: Secondary | ICD-10-CM | POA: Diagnosis not present

## 2023-05-30 DIAGNOSIS — Z992 Dependence on renal dialysis: Secondary | ICD-10-CM | POA: Insufficient documentation

## 2023-05-30 DIAGNOSIS — D509 Iron deficiency anemia, unspecified: Secondary | ICD-10-CM | POA: Diagnosis not present

## 2023-05-30 NOTE — Patient Instructions (Signed)
Medication Instructions:  Your physician recommends that you continue on your current medications as directed. Please refer to the Current Medication list given to you today.  *If you need a refill on your cardiac medications before your next appointment, please call your pharmacy*  Lab Work: None ordered If you have labs (blood work) drawn today and your tests are completely normal, you will receive your results only by: MyChart Message (if you have MyChart) OR A paper copy in the mail If you have any lab test that is abnormal or we need to change your treatment, we will call you to review the results.  Follow-Up: At [facility], you and your health needs are our priority.  As part of our continuing mission to provide you with exceptional heart care, we have created designated Provider Care Teams.  These Care Teams include your primary Cardiologist (physician) and Advanced Practice Providers (APPs -  Physician Assistants and Nurse Practitioners) who all work together to provide you with the care you need, when you need it.  We recommend signing up for the patient portal called "MyChart".  Sign up information is provided on this After Visit Summary.  MyChart is used to connect with patients for Virtual Visits (Telemedicine).  Patients are able to view lab/test results, encounter notes, upcoming appointments, etc.  Non-urgent messages can be sent to your provider as well.   To learn more about what you can do with MyChart, go to [email or web address].    Your next appointment:   6 month(s)  Provider:   You may see Will Jorja Loa, MD or one of the following Advanced Practice Providers on your designated Care Team:   Francis Dowse, [place] [address], [place] Sherie Don, NP Canary Brim, NP

## 2023-05-31 DIAGNOSIS — N186 End stage renal disease: Secondary | ICD-10-CM | POA: Diagnosis not present

## 2023-05-31 DIAGNOSIS — I132 Hypertensive heart and chronic kidney disease with heart failure and with stage 5 chronic kidney disease, or end stage renal disease: Secondary | ICD-10-CM | POA: Diagnosis not present

## 2023-05-31 DIAGNOSIS — G9341 Metabolic encephalopathy: Secondary | ICD-10-CM | POA: Diagnosis not present

## 2023-05-31 DIAGNOSIS — E1122 Type 2 diabetes mellitus with diabetic chronic kidney disease: Secondary | ICD-10-CM | POA: Diagnosis not present

## 2023-05-31 DIAGNOSIS — I5022 Chronic systolic (congestive) heart failure: Secondary | ICD-10-CM | POA: Diagnosis not present

## 2023-05-31 DIAGNOSIS — A419 Sepsis, unspecified organism: Secondary | ICD-10-CM | POA: Diagnosis not present

## 2023-06-01 DIAGNOSIS — D509 Iron deficiency anemia, unspecified: Secondary | ICD-10-CM | POA: Diagnosis not present

## 2023-06-01 DIAGNOSIS — Z992 Dependence on renal dialysis: Secondary | ICD-10-CM | POA: Diagnosis not present

## 2023-06-01 DIAGNOSIS — D631 Anemia in chronic kidney disease: Secondary | ICD-10-CM | POA: Diagnosis not present

## 2023-06-01 DIAGNOSIS — Z23 Encounter for immunization: Secondary | ICD-10-CM | POA: Diagnosis not present

## 2023-06-01 DIAGNOSIS — N186 End stage renal disease: Secondary | ICD-10-CM | POA: Diagnosis not present

## 2023-06-01 DIAGNOSIS — N250 Renal osteodystrophy: Secondary | ICD-10-CM | POA: Diagnosis not present

## 2023-06-01 NOTE — Addendum Note (Signed)
Addended by: Gelene Mink on: 06/01/2023 02:01 PM   Modules accepted: Orders

## 2023-06-04 DIAGNOSIS — N250 Renal osteodystrophy: Secondary | ICD-10-CM | POA: Diagnosis not present

## 2023-06-04 DIAGNOSIS — D631 Anemia in chronic kidney disease: Secondary | ICD-10-CM | POA: Diagnosis not present

## 2023-06-04 DIAGNOSIS — Z23 Encounter for immunization: Secondary | ICD-10-CM | POA: Diagnosis not present

## 2023-06-04 DIAGNOSIS — Z992 Dependence on renal dialysis: Secondary | ICD-10-CM | POA: Diagnosis not present

## 2023-06-04 DIAGNOSIS — N186 End stage renal disease: Secondary | ICD-10-CM | POA: Diagnosis not present

## 2023-06-04 DIAGNOSIS — D509 Iron deficiency anemia, unspecified: Secondary | ICD-10-CM | POA: Diagnosis not present

## 2023-06-05 DIAGNOSIS — E1142 Type 2 diabetes mellitus with diabetic polyneuropathy: Secondary | ICD-10-CM | POA: Diagnosis not present

## 2023-06-05 DIAGNOSIS — B351 Tinea unguium: Secondary | ICD-10-CM | POA: Diagnosis not present

## 2023-06-06 ENCOUNTER — Ambulatory Visit (INDEPENDENT_AMBULATORY_CARE_PROVIDER_SITE_OTHER): Payer: Medicare Other | Admitting: Vascular Surgery

## 2023-06-06 ENCOUNTER — Encounter: Payer: Self-pay | Admitting: Vascular Surgery

## 2023-06-06 VITALS — BP 116/64 | HR 68 | Temp 97.9°F | Resp 20 | Ht 64.0 in | Wt 214.0 lb

## 2023-06-06 DIAGNOSIS — Z23 Encounter for immunization: Secondary | ICD-10-CM | POA: Diagnosis not present

## 2023-06-06 DIAGNOSIS — N186 End stage renal disease: Secondary | ICD-10-CM

## 2023-06-06 DIAGNOSIS — N250 Renal osteodystrophy: Secondary | ICD-10-CM | POA: Diagnosis not present

## 2023-06-06 DIAGNOSIS — D509 Iron deficiency anemia, unspecified: Secondary | ICD-10-CM | POA: Diagnosis not present

## 2023-06-06 DIAGNOSIS — Z992 Dependence on renal dialysis: Secondary | ICD-10-CM | POA: Diagnosis not present

## 2023-06-06 DIAGNOSIS — D631 Anemia in chronic kidney disease: Secondary | ICD-10-CM | POA: Diagnosis not present

## 2023-06-06 NOTE — Progress Notes (Signed)
Patient ID: GOR THRAPP, male   DOB: [birth year], 72 y.o.   MRN: [id number]  Reason for Consult: Follow-up   Referred by Avon Gully Demissie*  Subjective:     HPI:  [name] [name] is a 72 y.o. male recently peritoneal dialysis catheter removal and subsequent venoplasty of 3 separate lesions within the fistula and he is now here for follow-up.  [birth year] actually [birth year] birthday.  He states that his fistula has been working well only occasionally has prolonged bleeding after dialysis but that does not happen today with dialysis and he is having good clearance as well.  He really has no complaints related to today's visit.  Past Medical History:  Diagnosis Date   Anemia    Arthritis    Asthma    Bell palsy    CAD (coronary artery disease)    a. 2014 MV: abnl w/ infap ischemia; b. 03/2013 Cath: aneurysmal bleb in the LAD w/ otw nonobs dzs-->Med Rx.   Chronic back pain    Chronic knee pain    a. 09/2015 s/p R TKA.   Chronic pain    Chronic shoulder pain    Chronic sinusitis    COPD (chronic obstructive pulmonary disease) (HCC)    Diabetes mellitus without complication (HCC)    type II    ESRD on peritoneal dialysis (HCC)    on peritoneal dialysis, [facility]   Essential hypertension    GERD (gastroesophageal reflux disease)    Gout    Hepatomegaly    noted on noncontrast CT 2015   History of hiatal hernia    Hyperlipidemia    Lateral meniscus tear    Obesity    Truncal   Obstructive sleep apnea    does not use cpap    On home oxygen therapy    uses 2l when is going somewhere per patient    PUD (peptic ulcer disease)    remote, reports f/u EGD about 8 years ago unremarkable    Reactive airway disease    related to exposure to chemical during 9/11   Sinusitis    Vitamin D deficiency    Family History  Problem Relation Age of Onset   Hypertension Mother        MI   Cancer Mother        breast    Diabetes Mother    Diabetes Father    Hypertension Father     Hypertension Sister    Diabetes Sister    Arthritis Other    Asthma Other    Lung disease Other    Anesthesia problems Neg Hx    Hypotension Neg Hx    Malignant hyperthermia Neg Hx    Pseudochol deficiency Neg Hx    Colon cancer Neg Hx    Past Surgical History:  Procedure Laterality Date   A/V FISTULAGRAM N/A 02/28/2023   Procedure: A/V Fistulagram;  Surgeon: Victorino Sparrow, MD;  Location: [facility] INVASIVE CV LAB;  Service: Cardiovascular;  Laterality: N/A;   ASAD LT SHOULDER  12/15/2008   left shoulder   AV FISTULA PLACEMENT Left 08/09/2016   Procedure: BRACHIOCEPHALIC ARTERIOVENOUS (AV) FISTULA CREATION LEFT ARM;  Surgeon: Sherren Kerns, MD;  Location: [facility] OR;  Service: Vascular;  Laterality: Left;   CAPD INSERTION N/A 10/07/2018   Procedure: LAPAROSCOPIC PERITONEAL CATHETER PLACEMENT;  Surgeon: Rodman Pickle, MD;  Location: [facility] ORS;  Service: General;  Laterality: N/A;   CATARACT EXTRACTION W/PHACO Left 03/28/2016   Procedure: CATARACT  EXTRACTION PHACO AND INTRAOCULAR LENS PLACEMENT LEFT EYE;  Surgeon: Jethro Bolus, MD;  Location: [facility] ORS;  Service: Ophthalmology;  Laterality: Left;  CDE: 4.77   CATARACT EXTRACTION W/PHACO Right 04/11/2016   Procedure: CATARACT EXTRACTION PHACO AND INTRAOCULAR LENS PLACEMENT RIGHT EYE; CDE:  4.74;  Surgeon: Jethro Bolus, MD;  Location: [facility] ORS;  Service: Ophthalmology;  Laterality: Right;   COLONOSCOPY  10/15/2008   Fields: Rectal polyp obliterated, not retrieved, hemorrhoids, single ascending colon diverticulum near the CV. Next colonoscopy April 2020   COLONOSCOPY N/A 12/25/2014   SLF: 1. Colorectal polyps (2) removed 2. Small internal hemorrhoids 3. the left colon is severely redundant. hyperplastic polyps   CORONARY ARTERY BYPASS GRAFT N/A 06/05/2022   Procedure: OFF PUMP CORONARY ARTERY BYPASS GRAFTING (CABG) X 2 BYPASSES USING LEFT INTERNAL MAMMARY ARTERY AND RIGHT LEG GREATER SAPHENOUS VEIN HARVESTED ENDOSCOPICALLY;  Surgeon: Corliss Skains, MD;  Location: [facility] OR;  Service: Open Heart Surgery;  Laterality: N/A;   CORONARY STENT INTERVENTION N/A 07/25/2021   Procedure: CORONARY STENT INTERVENTION;  Surgeon: Tonny Bollman, MD;  Location: [facility] INVASIVE CV LAB;  Service: Cardiovascular;  Laterality: N/A;   CORONARY STENT INTERVENTION N/A 12/26/2021   Procedure: CORONARY STENT INTERVENTION;  Surgeon: Yvonne Kendall, MD;  Location: [facility] INVASIVE CV LAB;  Service: Cardiovascular;  Laterality: N/A;   CORONARY STENT INTERVENTION N/A 01/20/2022   Procedure: CORONARY STENT INTERVENTION;  Surgeon: Tonny Bollman, MD;  Location: [facility] INVASIVE CV LAB;  Service: Cardiovascular;  Laterality: N/A;   DOPPLER ECHOCARDIOGRAPHY     ESOPHAGOGASTRODUODENOSCOPY N/A 12/25/2014   SLF: 1. Anemia most likely due to CRI, gastritis, gastric polyps 2. Moderate non-erosive gastriits and mild duodenitis.  3.TWo large gstric polyps removed.    EYE SURGERY  12/22/2010   tear duct probing-[employer]   FOREIGN BODY REMOVAL  03/29/2011   Procedure: REMOVAL FOREIGN BODY EXTREMITY;  Surgeon: Fuller Canada, MD;  Location: [facility] ORS;  Service: Orthopedics;  Laterality: Right;  Removal Foreign Body Right Thumb   IR FLUORO GUIDE CV LINE RIGHT  08/06/2018   IR FLUORO GUIDE CV LINE RIGHT  02/17/2023   IR US GUIDE VASC ACCESS RIGHT  08/06/2018   IR US GUIDE VASC ACCESS RIGHT  02/17/2023   KNEE ARTHROSCOPY  10/16/2007   left   KNEE ARTHROSCOPY WITH LATERAL MENISECTOMY Right 10/14/2015   Procedure: LEFT KNEE ARTHROSCOPY WITH PARTIAL LATERAL MENISECTOMY;  Surgeon: Vickki Hearing, MD;  Location: [facility];  Service: Orthopedics;  Laterality: Right;   LEFT HEART CATH AND CORONARY ANGIOGRAPHY N/A 07/25/2021   Procedure: LEFT HEART CATH AND CORONARY ANGIOGRAPHY;  Surgeon: Tonny Bollman, MD;  Location: [facility] INVASIVE CV LAB;  Service: Cardiovascular;  Laterality: N/A;   LEFT HEART CATH AND CORONARY ANGIOGRAPHY N/A 12/26/2021   Procedure: LEFT HEART CATH AND CORONARY ANGIOGRAPHY;   Surgeon: Yvonne Kendall, MD;  Location: [facility] INVASIVE CV LAB;  Service: Cardiovascular;  Laterality: N/A;   LEFT HEART CATH AND CORONARY ANGIOGRAPHY N/A 01/20/2022   Procedure: LEFT HEART CATH AND CORONARY ANGIOGRAPHY;  Surgeon: Tonny Bollman, MD;  Location: [facility] INVASIVE CV LAB;  Service: Cardiovascular;  Laterality: N/A;   LEFT HEART CATHETERIZATION WITH CORONARY ANGIOGRAM N/A 03/28/2013   Procedure: LEFT HEART CATHETERIZATION WITH CORONARY ANGIOGRAM;  Surgeon: Marykay Lex, MD;  Location: [facility] CATH LAB;  Service: Cardiovascular;  Laterality: N/A;   NM MYOVIEW LTD     PENILE PROSTHESIS IMPLANT N/A 08/16/2015   Procedure: PENILE PROTHESIS INFLATABLE, three piece, Excisional biopsy of Penile ulcer, Penile molding;  Surgeon: Jethro Bolus, MD;  Location: [facility] ORS;  Service: Urology;  Laterality: N/A;   PENILE PROSTHESIS IMPLANT N/A 12/24/2017   Procedure: REMOVAL AND  REPLACEMENT  COLOPLAST PENILE PROSTHESIS;  Surgeon: Crista Elliot, MD;  Location: [facility] ORS;  Service: Urology;  Laterality: N/A;   PERIPHERAL VASCULAR BALLOON ANGIOPLASTY  02/28/2023   Procedure: PERIPHERAL VASCULAR BALLOON ANGIOPLASTY;  Surgeon: Victorino Sparrow, MD;  Location: [facility] INVASIVE CV LAB;  Service: Cardiovascular;;   PORT-A-CATH REMOVAL N/A 02/19/2023   Procedure: REMOVAL OF PERITONEAL DIALYSIS CATHETER;  Surgeon: Victorino Sparrow, MD;  Location: [facility] OR;  Service: Vascular;  Laterality: N/A;   QUADRICEPS TENDON REPAIR  07/21/2011   Procedure: REPAIR QUADRICEP TENDON;  Surgeon: Fuller Canada, MD;  Location: [facility]S;  Service: Orthopedics;  Laterality: Right;   RIGHT/LEFT HEART CATH AND CORONARY ANGIOGRAPHY N/A 05/30/2022   Procedure: RIGHT/LEFT HEART CATH AND CORONARY ANGIOGRAPHY;  Surgeon: Corky Crafts, MD;  Location: [facility] INVASIVE CV LAB;  Service: Cardiovascular;  Laterality: N/A;   TEE WITHOUT CARDIOVERSION N/A 06/05/2022   Procedure: TRANSESOPHAGEAL ECHOCARDIOGRAM (TEE);  Surgeon: Corliss Skains, MD;   Location: [facility] OR;  Service: Open Heart Surgery;  Laterality: N/A;   TOENAIL EXCISION     removed x2-bilateral   UMBILICAL HERNIA REPAIR  07/17/2005   [place]    Short Social History:  Social History   Tobacco Use   Smoking status: Former    Current packs/day: 0.00    Average packs/day: 1 pack/day for 25.0 years (25.0 ttl pk-yrs)    Types: Cigarettes    Start date: 03/27/1985    Quit date: 03/27/2010    Years since quitting: 13.2   Smokeless tobacco: Never   Tobacco comments:    Quit x 7 years  Substance Use Topics   Alcohol use: Not Currently    Comment: occasionally    Allergies  Allergen Reactions   Opana [Oxymorphone Hcl] Itching   Tramadol Itching    Current Outpatient Medications  Medication Sig Dispense Refill   albuterol (VENTOLIN HFA) 108 (90 Base) MCG/ACT inhaler Inhale 2 puffs into the lungs every 6 (six) hours as needed for wheezing or shortness of breath. 18 g 11   amLODipine (NORVASC) 5 MG tablet Take 1 tablet (5 mg total) by mouth daily. 30 tablet 0   ARIPiprazole (ABILIFY) 10 MG tablet Take 10 mg by mouth at bedtime.     ascorbic acid (VITAMIN C) 500 MG tablet Take 1 tablet (500 mg total) by mouth daily. 100 tablet 0   aspirin EC 81 MG tablet Take 81 mg by mouth daily. Swallow whole.     atorvastatin (LIPITOR) 80 MG tablet Take 1 tablet (80 mg total) by mouth daily. 30 tablet 1   Darbepoetin Alfa (ARANESP) 150 MCG/0.3ML SOSY injection Inject 0.3 mLs (150 mcg total) into the skin every Sunday at 6pm.     Darbepoetin Alfa-Albumin (ARANESP IJ) Inject as directed. Done at HD     esomeprazole (NEXIUM) 40 MG capsule Take 40 mg by mouth 2 (two) times daily before a meal.     fluticasone furoate-vilanterol (BREO ELLIPTA) 200-25 MCG/ACT AEPB Use 1 (one) inhaltion once a day. 60 each 0   hydrocortisone (ANUSOL-HC) 2.5 % rectal cream Place 1 Application rectally 2 (two) times daily. (Patient taking differently: Place 1 Application rectally as needed.) 30 g 1   Nystatin  (GERHARDT'S BUTT CREAM) CREA Apply 1 Application topically 4 (four) times daily.     No current facility-administered medications for  this visit.    Review of Systems  Constitutional:  Constitutional negative. HENT: HENT negative.  Eyes: Eyes negative.  Respiratory: Respiratory negative.  Cardiovascular: Cardiovascular negative.  GI: Gastrointestinal negative.  Musculoskeletal: Musculoskeletal negative.  Skin: Skin negative.  Neurological: Neurological negative. Hematologic:       Occasional increased bleeding after dialysis Psychiatric: Psychiatric negative.        Objective:  Objective   Vitals:   06/06/23 1347  BP: 116/64  Pulse: 68  Resp: 20  Temp: 97.9 F (36.6 C)  SpO2: 95%  Weight: 214 lb (97.1 kg)  Height: 5\' 4"  (1.626 m)   Body mass index is 36.73 kg/m.  Physical Exam HENT:     Head: Normocephalic.     Nose: Nose normal.  Eyes:     Pupils: Pupils are equal, round, and reactive to light.  Pulmonary:     Effort: Pulmonary effort is normal.  Abdominal:     General: Abdomen is flat.  Musculoskeletal:        General: Normal range of motion.     Cervical back: Normal range of motion.     Right lower leg: No edema.     Left lower leg: No edema.     Comments: Left arm AV fistula has pulsatility peripherally with more of a thrill palpable up to the shoulder and centrally  Skin:    General: Skin is warm.     Capillary Refill: Capillary refill takes less than 2 seconds.  Neurological:     General: No focal deficit present.     Mental Status: He is alert.  Psychiatric:        Mood and Affect: Mood normal.        Thought Content: Thought content normal.     Data: No studies     Assessment/Plan:    72 year old male recently admitted with PD catheter removal and was having some malfunction of the left arm AV fistula.  Fistula not working well that he is not in the hospital although with some increased bleeding times occasionally.  On review there does  appear to be multiple stenoses including the cephalic arch stenosis.  If he has any future issues he would benefit from fistulogram followed by likely transposition with possible return down to the deep system given the multiple areas of stenosis and the tortuosity of the fistula.  At this time will not plan for any further interventions and he can see me on an as-needed basis.     [name] Harman MD [facility]

## 2023-06-07 ENCOUNTER — Ambulatory Visit (INDEPENDENT_AMBULATORY_CARE_PROVIDER_SITE_OTHER): Payer: Medicare Other | Admitting: Nurse Practitioner

## 2023-06-07 ENCOUNTER — Encounter: Payer: Self-pay | Admitting: Nurse Practitioner

## 2023-06-07 VITALS — BP 127/70 | HR 76 | Ht 64.0 in | Wt 217.6 lb

## 2023-06-07 DIAGNOSIS — E1122 Type 2 diabetes mellitus with diabetic chronic kidney disease: Secondary | ICD-10-CM | POA: Diagnosis not present

## 2023-06-07 DIAGNOSIS — I10 Essential (primary) hypertension: Secondary | ICD-10-CM | POA: Diagnosis not present

## 2023-06-07 DIAGNOSIS — Z992 Dependence on renal dialysis: Secondary | ICD-10-CM

## 2023-06-07 DIAGNOSIS — E785 Hyperlipidemia, unspecified: Secondary | ICD-10-CM | POA: Diagnosis not present

## 2023-06-07 DIAGNOSIS — N186 End stage renal disease: Secondary | ICD-10-CM

## 2023-06-07 NOTE — Progress Notes (Signed)
06/07/2023   Endocrinology follow-up note   Subjective:    Patient ID: [name] [name], male    DOB: [birth year], PCP Benetta Spar, MD   Past Medical History:  Diagnosis Date   Anemia    Arthritis    Asthma    Bell palsy    CAD (coronary artery disease)    a. 2014 MV: abnl w/ infap ischemia; b. 03/2013 Cath: aneurysmal bleb in the LAD w/ otw nonobs dzs-->Med Rx.   Chronic back pain    Chronic knee pain    a. 09/2015 s/p R TKA.   Chronic pain    Chronic shoulder pain    Chronic sinusitis    COPD (chronic obstructive pulmonary disease) (HCC)    Diabetes mellitus without complication (HCC)    type II    ESRD on peritoneal dialysis (HCC)    on peritoneal dialysis, [facility]   Essential hypertension    GERD (gastroesophageal reflux disease)    Gout    Hepatomegaly    noted on noncontrast CT 2015   History of hiatal hernia    Hyperlipidemia    Lateral meniscus tear    Obesity    Truncal   Obstructive sleep apnea    does not use cpap    On home oxygen therapy    uses 2l when is going somewhere per patient    PUD (peptic ulcer disease)    remote, reports f/u EGD about 8 years ago unremarkable    Reactive airway disease    related to exposure to chemical during 9/11   Sinusitis    Vitamin D deficiency    Past Surgical History:  Procedure Laterality Date   A/V FISTULAGRAM N/A 02/28/2023   Procedure: A/V Fistulagram;  Surgeon: Victorino Sparrow, MD;  Location: [facility] INVASIVE CV LAB;  Service: Cardiovascular;  Laterality: N/A;   ASAD LT SHOULDER  12/15/2008   left shoulder   AV FISTULA PLACEMENT Left 08/09/2016   Procedure: BRACHIOCEPHALIC ARTERIOVENOUS (AV) FISTULA CREATION LEFT ARM;  Surgeon: Sherren Kerns, MD;  Location: [facility] OR;  Service: Vascular;  Laterality: Left;   CAPD INSERTION N/A 10/07/2018   Procedure: LAPAROSCOPIC PERITONEAL CATHETER PLACEMENT;  Surgeon: Rodman Pickle, MD;  Location: [facility] ORS;  Service: General;   Laterality: N/A;   CATARACT EXTRACTION W/PHACO Left 03/28/2016   Procedure: CATARACT EXTRACTION PHACO AND INTRAOCULAR LENS PLACEMENT LEFT EYE;  Surgeon: Jethro Bolus, MD;  Location: [facility] ORS;  Service: Ophthalmology;  Laterality: Left;  CDE: 4.77   CATARACT EXTRACTION W/PHACO Right 04/11/2016   Procedure: CATARACT EXTRACTION PHACO AND INTRAOCULAR LENS PLACEMENT RIGHT EYE; CDE:  4.74;  Surgeon: Jethro Bolus, MD;  Location: [facility] ORS;  Service: Ophthalmology;  Laterality: Right;   COLONOSCOPY  10/15/2008   Fields: Rectal polyp obliterated, not retrieved, hemorrhoids, single ascending colon diverticulum near the CV. Next colonoscopy April 2020   COLONOSCOPY N/A 12/25/2014   SLF: 1. Colorectal polyps (2) removed 2. Small internal hemorrhoids 3. the left colon is severely redundant. hyperplastic polyps   CORONARY ARTERY BYPASS GRAFT N/A 06/05/2022   Procedure: OFF PUMP CORONARY ARTERY BYPASS GRAFTING (CABG) X 2 BYPASSES USING LEFT INTERNAL MAMMARY ARTERY AND RIGHT LEG GREATER SAPHENOUS VEIN HARVESTED ENDOSCOPICALLY;  Surgeon: Corliss Skains, MD;  Location: [facility] OR;  Service: Open Heart Surgery;  Laterality: N/A;   CORONARY STENT INTERVENTION N/A 07/25/2021   Procedure: CORONARY STENT INTERVENTION;  Surgeon: Tonny Bollman, MD;  Location: [facility] INVASIVE CV LAB;  Service: Cardiovascular;  Laterality: N/A;   CORONARY STENT INTERVENTION N/A 12/26/2021   Procedure: CORONARY STENT INTERVENTION;  Surgeon: Yvonne Kendall, MD;  Location: [facility] INVASIVE CV LAB;  Service: Cardiovascular;  Laterality: N/A;   CORONARY STENT INTERVENTION N/A 01/20/2022   Procedure: CORONARY STENT INTERVENTION;  Surgeon: Tonny Bollman, MD;  Location: [facility] INVASIVE CV LAB;  Service: Cardiovascular;  Laterality: N/A;   DOPPLER ECHOCARDIOGRAPHY     ESOPHAGOGASTRODUODENOSCOPY N/A 12/25/2014   SLF: 1. Anemia most likely due to CRI, gastritis, gastric polyps 2. Moderate non-erosive gastriits and mild duodenitis.  3.TWo large gstric polyps removed.     EYE SURGERY  12/22/2010   tear duct probing-[place]   FOREIGN BODY REMOVAL  03/29/2011   Procedure: REMOVAL FOREIGN BODY EXTREMITY;  Surgeon: Fuller Canada, MD;  Location: [facility];  Service: Orthopedics;  Laterality: Right;  Removal Foreign Body Right Thumb   IR FLUORO GUIDE CV LINE RIGHT  08/06/2018   IR FLUORO GUIDE CV LINE RIGHT  02/17/2023   IR US GUIDE VASC ACCESS RIGHT  08/06/2018   IR US GUIDE VASC ACCESS RIGHT  02/17/2023   KNEE ARTHROSCOPY  10/16/2007   left   KNEE ARTHROSCOPY WITH LATERAL MENISECTOMY Right 10/14/2015   Procedure: LEFT KNEE ARTHROSCOPY WITH PARTIAL LATERAL MENISECTOMY;  Surgeon: Vickki Hearing, MD;  Location: [facility];  Service: Orthopedics;  Laterality: Right;   LEFT HEART CATH AND CORONARY ANGIOGRAPHY N/A 07/25/2021   Procedure: LEFT HEART CATH AND CORONARY ANGIOGRAPHY;  Surgeon: Tonny Bollman, MD;  Location: [facility] INVASIVE CV LAB;  Service: Cardiovascular;  Laterality: N/A;   LEFT HEART CATH AND CORONARY ANGIOGRAPHY N/A 12/26/2021   Procedure: LEFT HEART CATH AND CORONARY ANGIOGRAPHY;  Surgeon: Yvonne Kendall, MD;  Location: [facility] INVASIVE CV LAB;  Service: Cardiovascular;  Laterality: N/A;   LEFT HEART CATH AND CORONARY ANGIOGRAPHY N/A 01/20/2022   Procedure: LEFT HEART CATH AND CORONARY ANGIOGRAPHY;  Surgeon: Tonny Bollman, MD;  Location: [facility] INVASIVE CV LAB;  Service: Cardiovascular;  Laterality: N/A;   LEFT HEART CATHETERIZATION WITH CORONARY ANGIOGRAM N/A 03/28/2013   Procedure: LEFT HEART CATHETERIZATION WITH CORONARY ANGIOGRAM;  Surgeon: Marykay Lex, MD;  Location: [facility] CATH LAB;  Service: Cardiovascular;  Laterality: N/A;   NM MYOVIEW LTD     PENILE PROSTHESIS IMPLANT N/A 08/16/2015   Procedure: PENILE PROTHESIS INFLATABLE, three piece, Excisional biopsy of Penile ulcer, Penile molding;  Surgeon: Jethro Bolus, MD;  Location: [facility];  Service: Urology;  Laterality: N/A;   PENILE PROSTHESIS IMPLANT N/A 12/24/2017   Procedure: REMOVAL AND  REPLACEMENT   COLOPLAST PENILE PROSTHESIS;  Surgeon: Crista Elliot, MD;  Location: [facility]S;  Service: Urology;  Laterality: N/A;   PERIPHERAL VASCULAR BALLOON ANGIOPLASTY  02/28/2023   Procedure: PERIPHERAL VASCULAR BALLOON ANGIOPLASTY;  Surgeon: Victorino Sparrow, MD;  Location: [facility] INVASIVE CV LAB;  Service: Cardiovascular;;   PORT-A-CATH REMOVAL N/A 02/19/2023   Procedure: REMOVAL OF PERITONEAL DIALYSIS CATHETER;  Surgeon: Victorino Sparrow, MD;  Location: [facility] OR;  Service: Vascular;  Laterality: N/A;   QUADRICEPS TENDON REPAIR  07/21/2011   Procedure: REPAIR QUADRICEP TENDON;  Surgeon: Fuller Canada, MD;  Location: [facility];  Service: Orthopedics;  Laterality: Right;   RIGHT/LEFT HEART CATH AND CORONARY ANGIOGRAPHY N/A 05/30/2022   Procedure: RIGHT/LEFT HEART CATH AND CORONARY ANGIOGRAPHY;  Surgeon: Corky Crafts, MD;  Location: [facility] INVASIVE CV LAB;  Service: Cardiovascular;  Laterality: N/A;   TEE WITHOUT CARDIOVERSION N/A 06/05/2022   Procedure: TRANSESOPHAGEAL  ECHOCARDIOGRAM (TEE);  Surgeon: Corliss Skains, MD;  Location: [facility] OR;  Service: Open Heart Surgery;  Laterality: N/A;   TOENAIL EXCISION     removed x2-bilateral   UMBILICAL HERNIA REPAIR  07/17/2005   [place]   Social History   Socioeconomic History   Marital status: Married    Spouse name: Not on file   Number of children: 2   Years of education: 12th grade   Highest education level: Not on file  Occupational History   Occupation: disabled   Occupation:      Associate Professor: UNEMPLOYED  Tobacco Use   Smoking status: Former    Current packs/day: 0.00    Average packs/day: 1 pack/day for 25.0 years (25.0 ttl pk-yrs)    Types: Cigarettes    Start date: 03/27/1985    Quit date: 03/27/2010    Years since quitting: 13.2   Smokeless tobacco: Never   Tobacco comments:    Quit x 7 years  Vaping Use   Vaping status: Never Used  Substance and Sexual Activity   Alcohol use: Not Currently    Comment: occasionally   Drug use: Yes     Types: Marijuana    Comment: cocaine- last time used- 11/24/2017 , marijuana-    Sexual activity: Yes  Other Topics Concern   Not on file  Social History Narrative   He quit smoking in 2010. 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 He developed pulmonary problems, became disabled because of lower airway disease in 2009.       WATCHES BASKETBALL. HIS TEAM IS [place].   Social Determinants of Health   Financial Resource Strain: Low Risk  (03/05/2023)   Received from [facility]   Overall Financial Resource Strain (CARDIA)    Difficulty of Paying Living Expenses: Not hard at all  Food Insecurity: No Food Insecurity (03/05/2023)   Received from [facility]   Hunger Vital Sign    Worried About Running Out of Food in the Last Year: Never true    Ran Out of Food in the Last Year: Never true  Transportation Needs: No Transportation Needs (03/12/2023)   Received from [facility]   SM SDOH Transportation Source    Has lack of transportation kept you from medical appointments or from getting medications?: No    Has lack of transportation kept you from meetings, work, or from getting things needed for daily living?: No  Physical Activity: Sufficiently Active (12/29/2021)   Exercise Vital Sign    Days of Exercise per Week: 3 days    Minutes of Exercise per Session: 60 min  Stress: Patient Declined (03/12/2023)   Received from [facility]   [employer] of Occupational Health - Occupational Stress Questionnaire    Feeling of Stress : Patient declined  Social Connections: Moderately Integrated (03/05/2023)   Received from [facility]   Social Connection and Isolation Panel [NHANES]    Frequency of Communication with Friends and Family: More than three times a week    Frequency of Social Gatherings with Friends and Family: More than three times a week    Attends Religious Services: More than 4 times per year    Active Member of [employer] or  Organizations: No    Attends Banker Meetings: Never    Marital Status: Married   Outpatient Encounter Medications as of 06/07/2023  Medication Sig   albuterol (VENTOLIN HFA) 108 (90 Base) MCG/ACT inhaler Inhale 2 puffs into the  lungs every 6 (six) hours as needed for wheezing or shortness of breath.   amLODipine (NORVASC) 5 MG tablet Take 1 tablet (5 mg total) by mouth daily.   ARIPiprazole (ABILIFY) 10 MG tablet Take 10 mg by mouth at bedtime.   ascorbic acid (VITAMIN C) 500 MG tablet Take 1 tablet (500 mg total) by mouth daily.   aspirin EC 81 MG tablet Take 81 mg by mouth daily. Swallow whole.   atorvastatin (LIPITOR) 80 MG tablet Take 1 tablet (80 mg total) by mouth daily.   Darbepoetin Alfa (ARANESP) 150 MCG/0.3ML SOSY injection Inject 0.3 mLs (150 mcg total) into the skin every Sunday at 6pm.   Darbepoetin Alfa-Albumin (ARANESP IJ) Inject as directed. Done at HD   esomeprazole (NEXIUM) 40 MG capsule Take 40 mg by mouth 2 (two) times daily before a meal.   fluticasone furoate-vilanterol (BREO ELLIPTA) 200-25 MCG/ACT AEPB Use 1 (one) inhaltion once a day.   hydrocortisone (ANUSOL-HC) 2.5 % rectal cream Place 1 Application rectally 2 (two) times daily. (Patient taking differently: Place 1 Application rectally as needed.)   Nystatin (GERHARDT'S BUTT CREAM) CREA Apply 1 Application topically 4 (four) times daily. (Patient not taking: Reported on 06/07/2023)   No facility-administered encounter medications on file as of 06/07/2023.   ALLERGIES: Allergies  Allergen Reactions   Opana [Oxymorphone Hcl] Itching   Tramadol Itching   VACCINATION STATUS: Immunization History  Administered Date(s) Administered   Fluad Quad(high Dose 65+) 04/16/2022   Influenza, High Dose Seasonal PF 09/12/2018, 04/17/2019, 05/11/2020   Influenza-Unspecified 09/12/2018, 04/01/2019, 04/22/2021   Moderna Sars-Covid-2 Vaccination 09/18/2019, 10/16/2019, 05/24/2020   PPD Test 07/19/2018,  09/07/2018, 11/18/2019   Pneumococcal Polysaccharide-23 09/13/2018, 05/03/2021   Td 03/27/2011    Diabetes He presents for his follow-up diabetic visit. He has type 2 diabetes mellitus. Onset time: He was diagnosed approximately 20 years ago. His disease course has been improving. There are no hypoglycemic associated symptoms. Pertinent negatives for hypoglycemia include no confusion, headaches, pallor or seizures. Pertinent negatives for diabetes include no chest pain, no fatigue, no polydipsia, no polyphagia, no polyuria, no weakness and no weight loss. There are no hypoglycemic complications. Symptoms are stable. Diabetic complications include heart disease and nephropathy. (Patient with end-stage renal disease now on hemodialysis.) Risk factors for coronary artery disease include dyslipidemia, diabetes mellitus, hypertension, obesity, sedentary lifestyle, tobacco exposure and male sex. When asked about current treatments, none were reported. He is compliant with treatment all of the time. His weight is fluctuating minimally. He is following a generally healthy diet. When asked about meal planning, he reported none. He has had a previous visit with a dietitian. He rarely participates in exercise. (He presents today, with no meter or logs to review.  His most recent A1c on 10/17 was 4.9%.  He does have chronica anemia resulting from ESRD so A1c may not be the most accurate measure of his diabetes control.  He has been in the hospital for a prolonged period of time on the rehab unit.  He was taken off all of his diabetes medications during that time.  He has not been monitoring glucose at home.  ) An ACE inhibitor/angiotensin II receptor blocker is being taken. He sees a podiatrist.Eye exam is current.  Hypertension This is a chronic problem. The current episode started more than 1 year ago. The problem has been resolved since onset. The problem is controlled. Pertinent negatives include no chest pain,  headaches, neck pain, palpitations or shortness of breath. There are  no associated agents to hypertension. Risk factors for coronary artery disease include diabetes mellitus, dyslipidemia, obesity, smoking/tobacco exposure, sedentary lifestyle and male gender. Past treatments include angiotensin blockers, calcium channel blockers, diuretics and beta blockers. There are no compliance problems.  Hypertensive end-organ damage includes kidney disease and CAD/MI. Identifiable causes of hypertension include chronic renal disease and sleep apnea.  Hyperlipidemia This is a chronic problem. The current episode started more than 1 year ago. The problem is controlled. Recent lipid tests were reviewed and are normal (LDL normal at 63 triglycerides elevated at 157.). Exacerbating diseases include chronic renal disease, diabetes and obesity. Factors aggravating his hyperlipidemia include fatty foods. Pertinent negatives include no chest pain, myalgias or shortness of breath. Current antihyperlipidemic treatment includes statins. The current treatment provides moderate improvement of lipids. There are no compliance problems.  Risk factors for coronary artery disease include diabetes mellitus, dyslipidemia, hypertension, male sex, a sedentary lifestyle and obesity.    Review of systems  Constitutional: + stable body weight,  current Body mass index is 37.35 kg/m. , no fatigue, no subjective hyperthermia, no subjective hypothermia Eyes: no blurry vision, no xerophthalmia ENT: no sore throat, no nodules palpated in throat, no dysphagia/odynophagia, no hoarseness Cardiovascular: no chest pain, no shortness of breath, no palpitations, no leg swelling Respiratory: no cough, no shortness of breath Gastrointestinal: no nausea/vomiting/diarrhea Musculoskeletal: no muscle/joint aches Skin: no rashes, no hyperemia Neurological: no tremors, no numbness, no tingling, no dizziness Psychiatric: no depression, no  anxiety   Objective:    BP 127/70 (BP Location: Left Arm, Patient Position: Sitting, Cuff Size: Large)   Pulse 76   Ht 5\' 4"  (1.626 m)   Wt 217 lb 9.6 oz (98.7 kg)   BMI 37.35 kg/m   Wt Readings from Last 3 Encounters:  06/07/23 217 lb 9.6 oz (98.7 kg)  06/06/23 214 lb (97.1 kg)  05/30/23 213 lb 6.4 oz (96.8 kg)    BP Readings from Last 3 Encounters:  06/07/23 127/70  06/06/23 116/64  05/30/23 110/70     Physical Exam- Limited  Constitutional:  Body mass index is 37.35 kg/m. , not in acute distress, normal state of mind Eyes:  EOMI, no exophthalmos Musculoskeletal: no gross deformities, strength intact in all four extremities, no gross restriction of joint movements Skin:  no rashes, no hyperemia Neurological: no tremor with outstretched hands   Diabetic Foot Exam - Simple   No data filed      Results for orders placed or performed in visit on 05/16/23  Comprehensive Metabolic Panel (CMET)  Result Value Ref Range   Glucose 101 (H) 70 - 99 mg/dL   BUN 40 (H) 8 - 27 mg/dL   Creatinine, Ser 4.09 (H) 0.76 - 1.27 mg/dL   eGFR 7 (L) >81 XB/JYN/8.29   BUN/Creatinine Ratio 5 (L) 10 - 24   Sodium 144 134 - 144 mmol/L   Potassium 4.6 3.5 - 5.2 mmol/L   Chloride 101 96 - 106 mmol/L   CO2 25 20 - 29 mmol/L   Calcium 9.2 8.6 - 10.2 mg/dL   Total Protein 6.5 6.0 - 8.5 g/dL   Albumin 3.7 (L) 3.8 - 4.8 g/dL   Globulin, Total 2.8 1.5 - 4.5 g/dL   Bilirubin Total 0.6 0.0 - 1.2 mg/dL   Alkaline Phosphatase 185 (H) 44 - 121 IU/L   AST 26 0 - 40 IU/L   ALT 30 0 - 44 IU/L  INR/PT  Result Value Ref Range   INR 1.0 0.9 -  1.2   Prothrombin Time 11.3 9.1 - 12.0 sec  AFP tumor marker  Result Value Ref Range   AFP, Serum, Tumor Marker 2.9 0.0 - 8.4 ng/mL   *Note: Due to a large number of results and/or encounters for the requested time period, some results have not been displayed. A complete set of results can be found in Results Review.   Diabetic Labs (most recent): Lab  Results  Component Value Date   HGBA1C 4.9 05/03/2023   HGBA1C 6.3 (H) 03/04/2023   HGBA1C 7.1 (H) 02/17/2023   MICROALBUR 7.4 02/09/2020   MICROALBUR 57.0 06/26/2017   Lipid Panel     Component Value Date/Time   CHOL 90 05/03/2023 0518   CHOL 104 06/10/2021 1337   TRIG 55 05/03/2023 0518   HDL 38 (L) 05/03/2023 0518   HDL 30 (L) 06/10/2021 1337   CHOLHDL 2.4 05/03/2023 0518   VLDL 11 05/03/2023 0518   LDLCALC 41 05/03/2023 0518   LDLCALC 47 11/23/2021 1330     Assessment & Plan:   1) Type 2 diabetes mellitus with ESRD on HD, with long-term current use of insulin (HCC)  -His  Diabetes is complicated by end-stage renal disease recently initiated on hemodialysis,  and patient remains at a high risk for more acute and chronic complications of diabetes which include CAD, CVA, CKD, retinopathy, and neuropathy. These are all discussed in detail with the patient.  He presents today, with no meter or logs to review.  His most recent A1c on 10/17 was 4.9%.  He does have chronica anemia resulting from ESRD so A1c may not be the most accurate measure of his diabetes control.  He has been in the hospital for a prolonged period of time on the rehab unit.  He was taken off all of his diabetes medications during that time.  He has not been monitoring glucose at home.     Recent labs reviewed.  - Nutritional counseling repeated at each appointment due to patients tendency to fall back in to old habits.  - The patient admits there is a room for improvement in their diet and drink choices. -  Suggestion is made for the patient to avoid simple carbohydrates from their diet including Cakes, Sweet Desserts / Pastries, Ice Cream, Soda (diet and regular), Sweet Tea, Candies, Chips, Cookies, Sweet Pastries, Store Bought Juices, Alcohol in Excess of 1-2 drinks a day, Artificial Sweeteners, Coffee Creamer, and "Sugar-free" Products. This will help patient to have stable blood glucose profile and  potentially avoid unintended weight gain.   - I encouraged the patient to switch to unprocessed or minimally processed complex starch and increased protein intake (animal or plant source), fruits, and vegetables.   - Patient is advised to stick to a routine mealtimes to eat 3 meals a day and avoid unnecessary snacks (to snack only to correct hypoglycemia).  - I have approached patient with the following individualized plan to manage diabetes and patient agrees.  -He is advised to stay away from alcohol and smoking.  -He is advised to stay off his diabetes medications at this time.  I did ask that he monitor fasting glucose 2-3 times per week and notify me if readings are above 200 consistently.  -He will continue to follow-up with nephrology given stage 5 CKD on PD.  -he is not a candidate for metformin and SGLT2 inhibitors due to CKD.   - Patient specific target  for A1c; LDL, HDL, Triglycerides, were discussed in detail.  2)  BP/HTN:  His blood pressure is controlled to target. He is advised to continue Norvasc 5 mg po daily, Metoprolol 25 mg po daily, Demadex 100 mg po daily, and Losartan 100 mg po daily.    3) Lipids/HPL:  His recent lipid panel from 05/03/23 shows controlled LDL stable at 41.  He is advised to continue Simvastatin 20 mg p.o. nightly.    4)  Weight/Diet: His Body mass index is 37.35 kg/m.- clearly complicating his DM care. He is a candidate for modest weight loss.   CDE consult in progress, exercise, and carbohydrates information provided.  5) Chronic Care/Health Maintenance: -Patient is on ARB and Statin medications and encouraged to continue to follow up with Ophthalmology, nephrology (he is status post fistula placement in prep for hemodialysis) Podiatrist at least yearly or according to recommendations, and advised to quit smoking. I have recommended yearly flu vaccine and pneumonia vaccination at least every 5 years; moderate intensity exercise for up to 150  minutes weekly; and  sleep for at least 7 hours a day.  - I advised patient to maintain close follow up with his PCP for primary care needs.         I spent  32  minutes in the care of the patient today including review of labs from CMP, Lipids, Thyroid Function, Hematology (current and previous including abstractions from other facilities); face-to-face time discussing  his blood glucose readings/logs, discussing hypoglycemia and hyperglycemia episodes and symptoms, medications doses, his options of short and long term treatment based on the latest standards of care / guidelines;  discussion about incorporating lifestyle medicine;  and documenting the encounter. Risk reduction counseling performed per USPSTF guidelines to reduce obesity and cardiovascular risk factors.     Please refer to Patient Instructions for Blood Glucose Monitoring and Insulin/Medications Dosing Guide"  in media tab for additional information. Please  also refer to " Patient Self Inventory" in the Media  tab for reviewed elements of pertinent patient history.  [name] [name] participated in the discussions, expressed understanding, and voiced agreement with the above plans.  All questions were answered to his satisfaction. he is encouraged to contact clinic should he have any questions or concerns prior to his return visit.    Follow up plan: -Return in about 3 months (around 09/07/2023) for Diabetes F/U with A1c in office, No previsit labs, Bring meter and logs.     Ronny Bacon, [facility] [address] [place], [place] [address] Phone: [phone] Fax: [phone]   06/07/2023, 11:02 AM

## 2023-06-08 DIAGNOSIS — Z23 Encounter for immunization: Secondary | ICD-10-CM | POA: Diagnosis not present

## 2023-06-08 DIAGNOSIS — I5022 Chronic systolic (congestive) heart failure: Secondary | ICD-10-CM | POA: Diagnosis not present

## 2023-06-08 DIAGNOSIS — I132 Hypertensive heart and chronic kidney disease with heart failure and with stage 5 chronic kidney disease, or end stage renal disease: Secondary | ICD-10-CM | POA: Diagnosis not present

## 2023-06-08 DIAGNOSIS — N250 Renal osteodystrophy: Secondary | ICD-10-CM | POA: Diagnosis not present

## 2023-06-08 DIAGNOSIS — G9341 Metabolic encephalopathy: Secondary | ICD-10-CM | POA: Diagnosis not present

## 2023-06-08 DIAGNOSIS — Z992 Dependence on renal dialysis: Secondary | ICD-10-CM | POA: Diagnosis not present

## 2023-06-08 DIAGNOSIS — I2510 Atherosclerotic heart disease of native coronary artery without angina pectoris: Secondary | ICD-10-CM | POA: Diagnosis not present

## 2023-06-08 DIAGNOSIS — D631 Anemia in chronic kidney disease: Secondary | ICD-10-CM | POA: Diagnosis not present

## 2023-06-08 DIAGNOSIS — G4733 Obstructive sleep apnea (adult) (pediatric): Secondary | ICD-10-CM | POA: Diagnosis not present

## 2023-06-08 DIAGNOSIS — E1122 Type 2 diabetes mellitus with diabetic chronic kidney disease: Secondary | ICD-10-CM | POA: Diagnosis not present

## 2023-06-08 DIAGNOSIS — E54 Ascorbic acid deficiency: Secondary | ICD-10-CM | POA: Diagnosis not present

## 2023-06-08 DIAGNOSIS — Z951 Presence of aortocoronary bypass graft: Secondary | ICD-10-CM | POA: Diagnosis not present

## 2023-06-08 DIAGNOSIS — Z6835 Body mass index (BMI) 35.0-35.9, adult: Secondary | ICD-10-CM | POA: Diagnosis not present

## 2023-06-08 DIAGNOSIS — K219 Gastro-esophageal reflux disease without esophagitis: Secondary | ICD-10-CM | POA: Diagnosis not present

## 2023-06-08 DIAGNOSIS — J449 Chronic obstructive pulmonary disease, unspecified: Secondary | ICD-10-CM | POA: Diagnosis not present

## 2023-06-08 DIAGNOSIS — E785 Hyperlipidemia, unspecified: Secondary | ICD-10-CM | POA: Diagnosis not present

## 2023-06-08 DIAGNOSIS — A419 Sepsis, unspecified organism: Secondary | ICD-10-CM | POA: Diagnosis not present

## 2023-06-08 DIAGNOSIS — Z794 Long term (current) use of insulin: Secondary | ICD-10-CM | POA: Diagnosis not present

## 2023-06-08 DIAGNOSIS — Z7951 Long term (current) use of inhaled steroids: Secondary | ICD-10-CM | POA: Diagnosis not present

## 2023-06-08 DIAGNOSIS — N186 End stage renal disease: Secondary | ICD-10-CM | POA: Diagnosis not present

## 2023-06-08 DIAGNOSIS — Z9981 Dependence on supplemental oxygen: Secondary | ICD-10-CM | POA: Diagnosis not present

## 2023-06-08 DIAGNOSIS — D509 Iron deficiency anemia, unspecified: Secondary | ICD-10-CM | POA: Diagnosis not present

## 2023-06-08 DIAGNOSIS — F0390 Unspecified dementia without behavioral disturbance: Secondary | ICD-10-CM | POA: Diagnosis not present

## 2023-06-11 ENCOUNTER — Other Ambulatory Visit (HOSPITAL_COMMUNITY): Payer: Self-pay

## 2023-06-11 DIAGNOSIS — D509 Iron deficiency anemia, unspecified: Secondary | ICD-10-CM | POA: Diagnosis not present

## 2023-06-11 DIAGNOSIS — Z992 Dependence on renal dialysis: Secondary | ICD-10-CM | POA: Diagnosis not present

## 2023-06-11 DIAGNOSIS — N186 End stage renal disease: Secondary | ICD-10-CM | POA: Diagnosis not present

## 2023-06-11 DIAGNOSIS — Z23 Encounter for immunization: Secondary | ICD-10-CM | POA: Diagnosis not present

## 2023-06-11 DIAGNOSIS — N250 Renal osteodystrophy: Secondary | ICD-10-CM | POA: Diagnosis not present

## 2023-06-11 DIAGNOSIS — D631 Anemia in chronic kidney disease: Secondary | ICD-10-CM | POA: Diagnosis not present

## 2023-06-12 DIAGNOSIS — I5022 Chronic systolic (congestive) heart failure: Secondary | ICD-10-CM | POA: Diagnosis not present

## 2023-06-12 DIAGNOSIS — N186 End stage renal disease: Secondary | ICD-10-CM | POA: Diagnosis not present

## 2023-06-12 DIAGNOSIS — A419 Sepsis, unspecified organism: Secondary | ICD-10-CM | POA: Diagnosis not present

## 2023-06-12 DIAGNOSIS — E1122 Type 2 diabetes mellitus with diabetic chronic kidney disease: Secondary | ICD-10-CM | POA: Diagnosis not present

## 2023-06-12 DIAGNOSIS — G9341 Metabolic encephalopathy: Secondary | ICD-10-CM | POA: Diagnosis not present

## 2023-06-12 DIAGNOSIS — I132 Hypertensive heart and chronic kidney disease with heart failure and with stage 5 chronic kidney disease, or end stage renal disease: Secondary | ICD-10-CM | POA: Diagnosis not present

## 2023-06-13 DIAGNOSIS — D631 Anemia in chronic kidney disease: Secondary | ICD-10-CM | POA: Diagnosis not present

## 2023-06-13 DIAGNOSIS — N186 End stage renal disease: Secondary | ICD-10-CM | POA: Diagnosis not present

## 2023-06-13 DIAGNOSIS — Z992 Dependence on renal dialysis: Secondary | ICD-10-CM | POA: Diagnosis not present

## 2023-06-13 DIAGNOSIS — N250 Renal osteodystrophy: Secondary | ICD-10-CM | POA: Diagnosis not present

## 2023-06-13 DIAGNOSIS — D509 Iron deficiency anemia, unspecified: Secondary | ICD-10-CM | POA: Diagnosis not present

## 2023-06-13 DIAGNOSIS — Z23 Encounter for immunization: Secondary | ICD-10-CM | POA: Diagnosis not present

## 2023-06-15 DIAGNOSIS — D631 Anemia in chronic kidney disease: Secondary | ICD-10-CM | POA: Diagnosis not present

## 2023-06-15 DIAGNOSIS — Z23 Encounter for immunization: Secondary | ICD-10-CM | POA: Diagnosis not present

## 2023-06-15 DIAGNOSIS — N186 End stage renal disease: Secondary | ICD-10-CM | POA: Diagnosis not present

## 2023-06-15 DIAGNOSIS — N250 Renal osteodystrophy: Secondary | ICD-10-CM | POA: Diagnosis not present

## 2023-06-15 DIAGNOSIS — D509 Iron deficiency anemia, unspecified: Secondary | ICD-10-CM | POA: Diagnosis not present

## 2023-06-15 DIAGNOSIS — Z992 Dependence on renal dialysis: Secondary | ICD-10-CM | POA: Diagnosis not present

## 2023-06-16 DIAGNOSIS — Z992 Dependence on renal dialysis: Secondary | ICD-10-CM | POA: Diagnosis not present

## 2023-06-16 DIAGNOSIS — N186 End stage renal disease: Secondary | ICD-10-CM | POA: Diagnosis not present

## 2023-06-17 DIAGNOSIS — D631 Anemia in chronic kidney disease: Secondary | ICD-10-CM | POA: Diagnosis not present

## 2023-06-17 DIAGNOSIS — D509 Iron deficiency anemia, unspecified: Secondary | ICD-10-CM | POA: Diagnosis not present

## 2023-06-17 DIAGNOSIS — N186 End stage renal disease: Secondary | ICD-10-CM | POA: Diagnosis not present

## 2023-06-17 DIAGNOSIS — Z992 Dependence on renal dialysis: Secondary | ICD-10-CM | POA: Diagnosis not present

## 2023-06-17 DIAGNOSIS — N2581 Secondary hyperparathyroidism of renal origin: Secondary | ICD-10-CM | POA: Diagnosis not present

## 2023-06-17 DIAGNOSIS — N250 Renal osteodystrophy: Secondary | ICD-10-CM | POA: Diagnosis not present

## 2023-06-17 DIAGNOSIS — Z23 Encounter for immunization: Secondary | ICD-10-CM | POA: Diagnosis not present

## 2023-06-18 DIAGNOSIS — D509 Iron deficiency anemia, unspecified: Secondary | ICD-10-CM | POA: Diagnosis not present

## 2023-06-18 DIAGNOSIS — D631 Anemia in chronic kidney disease: Secondary | ICD-10-CM | POA: Diagnosis not present

## 2023-06-18 DIAGNOSIS — Z23 Encounter for immunization: Secondary | ICD-10-CM | POA: Diagnosis not present

## 2023-06-18 DIAGNOSIS — Z992 Dependence on renal dialysis: Secondary | ICD-10-CM | POA: Diagnosis not present

## 2023-06-18 DIAGNOSIS — N186 End stage renal disease: Secondary | ICD-10-CM | POA: Diagnosis not present

## 2023-06-18 DIAGNOSIS — N2581 Secondary hyperparathyroidism of renal origin: Secondary | ICD-10-CM | POA: Diagnosis not present

## 2023-06-19 DIAGNOSIS — I5022 Chronic systolic (congestive) heart failure: Secondary | ICD-10-CM | POA: Diagnosis not present

## 2023-06-19 DIAGNOSIS — G9341 Metabolic encephalopathy: Secondary | ICD-10-CM | POA: Diagnosis not present

## 2023-06-19 DIAGNOSIS — A419 Sepsis, unspecified organism: Secondary | ICD-10-CM | POA: Diagnosis not present

## 2023-06-19 DIAGNOSIS — N186 End stage renal disease: Secondary | ICD-10-CM | POA: Diagnosis not present

## 2023-06-19 DIAGNOSIS — I132 Hypertensive heart and chronic kidney disease with heart failure and with stage 5 chronic kidney disease, or end stage renal disease: Secondary | ICD-10-CM | POA: Diagnosis not present

## 2023-06-19 DIAGNOSIS — E1122 Type 2 diabetes mellitus with diabetic chronic kidney disease: Secondary | ICD-10-CM | POA: Diagnosis not present

## 2023-06-20 DIAGNOSIS — Z23 Encounter for immunization: Secondary | ICD-10-CM | POA: Diagnosis not present

## 2023-06-20 DIAGNOSIS — Z992 Dependence on renal dialysis: Secondary | ICD-10-CM | POA: Diagnosis not present

## 2023-06-20 DIAGNOSIS — N2581 Secondary hyperparathyroidism of renal origin: Secondary | ICD-10-CM | POA: Diagnosis not present

## 2023-06-20 DIAGNOSIS — D631 Anemia in chronic kidney disease: Secondary | ICD-10-CM | POA: Diagnosis not present

## 2023-06-20 DIAGNOSIS — D509 Iron deficiency anemia, unspecified: Secondary | ICD-10-CM | POA: Diagnosis not present

## 2023-06-20 DIAGNOSIS — N186 End stage renal disease: Secondary | ICD-10-CM | POA: Diagnosis not present

## 2023-06-20 NOTE — Telephone Encounter (Signed)
Pt was seen by cardiology on 05/25/23 but has not seen pulmonary. They have been trying to get in touch with him to schedule an appointment

## 2023-06-21 DIAGNOSIS — K7469 Other cirrhosis of liver: Secondary | ICD-10-CM | POA: Diagnosis not present

## 2023-06-22 DIAGNOSIS — D631 Anemia in chronic kidney disease: Secondary | ICD-10-CM | POA: Diagnosis not present

## 2023-06-22 DIAGNOSIS — Z23 Encounter for immunization: Secondary | ICD-10-CM | POA: Diagnosis not present

## 2023-06-22 DIAGNOSIS — Z992 Dependence on renal dialysis: Secondary | ICD-10-CM | POA: Diagnosis not present

## 2023-06-22 DIAGNOSIS — D509 Iron deficiency anemia, unspecified: Secondary | ICD-10-CM | POA: Diagnosis not present

## 2023-06-22 DIAGNOSIS — N186 End stage renal disease: Secondary | ICD-10-CM | POA: Diagnosis not present

## 2023-06-22 DIAGNOSIS — N2581 Secondary hyperparathyroidism of renal origin: Secondary | ICD-10-CM | POA: Diagnosis not present

## 2023-06-25 DIAGNOSIS — N2581 Secondary hyperparathyroidism of renal origin: Secondary | ICD-10-CM | POA: Diagnosis not present

## 2023-06-25 DIAGNOSIS — Z992 Dependence on renal dialysis: Secondary | ICD-10-CM | POA: Diagnosis not present

## 2023-06-25 DIAGNOSIS — D631 Anemia in chronic kidney disease: Secondary | ICD-10-CM | POA: Diagnosis not present

## 2023-06-25 DIAGNOSIS — D509 Iron deficiency anemia, unspecified: Secondary | ICD-10-CM | POA: Diagnosis not present

## 2023-06-25 DIAGNOSIS — N186 End stage renal disease: Secondary | ICD-10-CM | POA: Diagnosis not present

## 2023-06-25 DIAGNOSIS — Z23 Encounter for immunization: Secondary | ICD-10-CM | POA: Diagnosis not present

## 2023-06-26 DIAGNOSIS — E1122 Type 2 diabetes mellitus with diabetic chronic kidney disease: Secondary | ICD-10-CM | POA: Diagnosis not present

## 2023-06-26 DIAGNOSIS — I132 Hypertensive heart and chronic kidney disease with heart failure and with stage 5 chronic kidney disease, or end stage renal disease: Secondary | ICD-10-CM | POA: Diagnosis not present

## 2023-06-26 DIAGNOSIS — G9341 Metabolic encephalopathy: Secondary | ICD-10-CM | POA: Diagnosis not present

## 2023-06-26 DIAGNOSIS — I5022 Chronic systolic (congestive) heart failure: Secondary | ICD-10-CM | POA: Diagnosis not present

## 2023-06-26 DIAGNOSIS — N186 End stage renal disease: Secondary | ICD-10-CM | POA: Diagnosis not present

## 2023-06-26 DIAGNOSIS — A419 Sepsis, unspecified organism: Secondary | ICD-10-CM | POA: Diagnosis not present

## 2023-06-27 DIAGNOSIS — D509 Iron deficiency anemia, unspecified: Secondary | ICD-10-CM | POA: Diagnosis not present

## 2023-06-27 DIAGNOSIS — N186 End stage renal disease: Secondary | ICD-10-CM | POA: Diagnosis not present

## 2023-06-27 DIAGNOSIS — Z992 Dependence on renal dialysis: Secondary | ICD-10-CM | POA: Diagnosis not present

## 2023-06-27 DIAGNOSIS — Z23 Encounter for immunization: Secondary | ICD-10-CM | POA: Diagnosis not present

## 2023-06-27 DIAGNOSIS — N2581 Secondary hyperparathyroidism of renal origin: Secondary | ICD-10-CM | POA: Diagnosis not present

## 2023-06-27 DIAGNOSIS — D631 Anemia in chronic kidney disease: Secondary | ICD-10-CM | POA: Diagnosis not present

## 2023-06-27 NOTE — Patient Instructions (Signed)
Below is our plan:  We will continue to monitor tremor. I will be happy to refer you to occupational therapy if you wish. We can consider neuropsychology eval for your memory loss. Please consider using CPAP. Discuss with pulmonology. Continue close follow up with your care team.   Please make sure you are staying well hydrated. I recommend 50-60 ounces daily. Well balanced diet and regular exercise encouraged. Consistent sleep schedule with 6-8 hours recommended.   Please continue follow up with care team as directed.   Follow up with me as needed   You may receive a survey regarding today's visit. I encourage you to leave honest feed back as I do use this information to improve patient care. Thank you for seeing me today!   Management of Memory Problems   There are some general things you can do to help manage your memory problems.  Your memory may not in fact recover, but by using techniques and strategies you will be able to manage your memory difficulties better.   1)  Establish a routine. Try to establish and then stick to a regular routine.  By doing this, you will get used to what to expect and you will reduce the need to rely on your memory.  Also, try to do things at the same time of day, such as taking your medication or checking your calendar first thing in the morning. Think about think that you can do as a part of a regular routine and make a list.  Then enter them into a daily planner to remind you.  This will help you establish a routine.   2)  Organize your environment. Organize your environment so that it is uncluttered.  Decrease visual stimulation.  Place everyday items such as keys or cell phone in the same place every day (ie.  Basket next to front door) Use post it notes with a brief message to yourself (ie. Turn off light, lock the door) Use labels to indicate where things go (ie. Which cupboards are for food, dishes, etc.) Keep a notepad and pen by the telephone to  take messages   3)  Memory Aids A diary or journal/notebook/daily planner Making a list (shopping list, chore list, to do list that needs to be done) Using an alarm as a reminder (kitchen timer or cell phone alarm) Using cell phone to store information (Notes, Calendar, Reminders) Calendar/White board placed in a prominent position Post-it notes   In order for memory aids to be useful, you need to have good habits.  It's no good remembering to make a note in your journal if you don't remember to look in it.  Try setting aside a certain time of day to look in journal.   4)  Improving mood and managing fatigue. There may be other factors that contribute to memory difficulties.  Factors, such as anxiety, depression and tiredness can affect memory. Regular gentle exercise can help improve your mood and give you more energy. Exercise: there are short videos created by the [employer] specially for older adults: https://bit.ly/2I30q97.  Mediterranean diet: which emphasizes fruits, vegetables, whole grains, legumes, fish, and other seafood; unsaturated fats such as olive oils; and low amounts of red meat, eggs, and sweets. A variation of this, called MIND (Mediterranean-DASH Intervention for Neurodegenerative Delay) incorporates the DASH (Dietary Approaches to Stop Hypertension) diet, which has been shown to lower high blood pressure, a risk factor for Alzheimer's disease. More information at: ExitMarketing.de.  Aerobic  exercise that improve heart health is also good for the mind.  [employer] have short videos for exercises that you can do at home: BlindWorkshop.com.pt Simple relaxation techniques may help relieve symptoms of anxiety Try to get back to completing activities or hobbies you enjoyed doing in the past. Learn to pace yourself through activities to decrease fatigue. Find out about  some local support groups where you can share experiences with others. Try and achieve 7-8 hours of sleep at night.   Tasks to improve attention/working memory 1. Good sleep hygiene (7-8 hrs of sleep) 2. Learning a new skill (Painting, Carpentry, Pottery, new language, Knitting). 3.Cognitive exercises (keep a daily journal, Puzzles) 4. Physical exercise and training  (30 min/day X 4 days week) 5. Being on Antidepressant if needed 6.Yoga, Meditation, Tai Chi 7. Decrease alcohol intake 8.Have a clear schedule and structure in daily routine   MIND Diet: The Mediterranean-DASH Diet Intervention for Neurodegenerative Delay, or MIND diet, targets the health of the aging brain. Research participants with the highest MIND diet scores had a significantly slower rate of cognitive decline compared with those with the lowest scores. The effects of the MIND diet on cognition showed greater effects than either the Mediterranean or the DASH diet alone.   The healthy items the MIND diet guidelines suggest include:   3+ servings a day of whole grains 1+ servings a day of vegetables (other than green leafy) 6+ servings a week of green leafy vegetables 5+ servings a week of nuts 4+ meals a week of beans 2+ servings a week of berries 2+ meals a week of poultry 1+ meals a week of fish Mainly olive oil if added fat is used   The unhealthy items, which are higher in saturated and trans fat, include: Less than 5 servings a week of pastries and sweets Less than 4 servings a week of red meat (including beef, pork, lamb, and products made from these meats) Less than one serving a week of cheese and fried foods Less than 1 tablespoon a day of butter/stick margarine

## 2023-06-27 NOTE — Progress Notes (Unsigned)
No chief complaint on file.   HISTORY OF PRESENT ILLNESS:  06/27/23 ALL:  [name] [name] is a 72 y.o. male here today for follow up for tremor. He was seen in consult with Dr Terrace Arabia 08/2022 for bilateral hand tremor following open heart surgery in 2023. Wife also reported changes in gait. MOCA 21/30. MRI ordered but not performed. MRI 02/2023 following AMS in setting of Covid showed moderate for age white matter changes with chronic microhemorrhages dt chronic small vessel disease.   Since,   He takes Abilify.  Memory   He continues dialysis  He is taking asa 81mg  and atorvastatin 80mg  daily. Followed by Dr Constance Haw, PCP.   HISTORY (copied from Dr Zannie Cove previous note)  [name] [name] is a 72 year old male, seen in request by his primary care physician Dr. Felecia Shelling, [place], for evaluation of worsening gait abnormality, memory loss, left hand tremor since open heart surgery, he is accompanied by his wife [name] [name] at today's clinical visit on September 11 2022   I reviewed and summarized the referring note. PMHX. CABG in Nov 2023 CAD in Jan 2023, s/p stent HLD Chronic low back pain DM HTN Left shoulder rotator cuff Left AV fistula placment,  ESRD-peritoneal dialysis since 2019. Penile prosthesis implant   He has end-stage renal disease, receiving peritoneal dialysis since 2019, has a left arm AV fistula, also had a history of left shoulder rotator cuff surgery in the past, around 2023, he noticed bilateral hands tremor, most noticeable when holding a cup, using utensils, writing, he denies family history of tremor   He also has a long history of diabetic peripheral neuropathy, numbness tingling, cold sensation at bilateral toes, fingers, using gabapentin which seems to help him   He underwent open heart surgery for coronary artery disease in November 2023, since then he was noted to have worsening bilateral hands tremor, wife also concerned about his worsening gait abnormality, he  can still ambulate without assistant, he denies significant low back, neck pain, denies bowel bladder incontinence   REVIEW OF SYSTEMS: Out of a complete 14 system review of symptoms, the patient complains only of the following symptoms, and all other reviewed systems are negative.   ALLERGIES: Allergies  Allergen Reactions   Opana [Oxymorphone Hcl] Itching   Tramadol Itching     HOME MEDICATIONS: Outpatient Medications Prior to Visit  Medication Sig Dispense Refill   albuterol (VENTOLIN HFA) 108 (90 Base) MCG/ACT inhaler Inhale 2 puffs into the lungs every 6 (six) hours as needed for wheezing or shortness of breath. 18 g 11   amLODipine (NORVASC) 5 MG tablet Take 1 tablet (5 mg total) by mouth daily. 30 tablet 0   ARIPiprazole (ABILIFY) 10 MG tablet Take 10 mg by mouth at bedtime.     ascorbic acid (VITAMIN C) 500 MG tablet Take 1 tablet (500 mg total) by mouth daily. 100 tablet 0   aspirin EC 81 MG tablet Take 81 mg by mouth daily. Swallow whole.     atorvastatin (LIPITOR) 80 MG tablet Take 1 tablet (80 mg total) by mouth daily. 30 tablet 1   Darbepoetin Alfa (ARANESP) 150 MCG/0.3ML SOSY injection Inject 0.3 mLs (150 mcg total) into the skin every Sunday at 6pm.     Darbepoetin Alfa-Albumin (ARANESP IJ) Inject as directed. Done at HD     esomeprazole (NEXIUM) 40 MG capsule Take 40 mg by mouth 2 (two) times daily before a meal.     fluticasone furoate-vilanterol (BREO ELLIPTA)  200-25 MCG/ACT AEPB Use 1 (one) inhaltion once a day. 60 each 0   hydrocortisone (ANUSOL-HC) 2.5 % rectal cream Place 1 Application rectally 2 (two) times daily. (Patient taking differently: Place 1 Application rectally as needed.) 30 g 1   Nystatin (GERHARDT'S BUTT CREAM) CREA Apply 1 Application topically 4 (four) times daily. (Patient not taking: Reported on 06/07/2023)     No facility-administered medications prior to visit.     PAST MEDICAL HISTORY: Past Medical History:  Diagnosis Date   Anemia     Arthritis    Asthma    Bell palsy    CAD (coronary artery disease)    a. 2014 MV: abnl w/ infap ischemia; b. 03/2013 Cath: aneurysmal bleb in the LAD w/ otw nonobs dzs-->Med Rx.   Chronic back pain    Chronic knee pain    a. 09/2015 s/p R TKA.   Chronic pain    Chronic shoulder pain    Chronic sinusitis    COPD (chronic obstructive pulmonary disease) (HCC)    Diabetes mellitus without complication (HCC)    type II    ESRD on peritoneal dialysis (HCC)    on peritoneal dialysis, [facility]    Essential hypertension    GERD (gastroesophageal reflux disease)    Gout    Hepatomegaly    noted on noncontrast CT 2015   History of hiatal hernia    Hyperlipidemia    Lateral meniscus tear    Obesity    Truncal   Obstructive sleep apnea    does not use cpap    On home oxygen therapy    uses 2l when is going somewhere per patient    PUD (peptic ulcer disease)    remote, reports f/u EGD about 8 years ago unremarkable    Reactive airway disease    related to exposure to chemical during 9/11   Sinusitis    Vitamin D deficiency      PAST SURGICAL HISTORY: Past Surgical History:  Procedure Laterality Date   A/V FISTULAGRAM N/A 02/28/2023   Procedure: A/V Fistulagram;  Surgeon: Victorino Sparrow, MD;  Location: [facility] INVASIVE CV LAB;  Service: Cardiovascular;  Laterality: N/A;   ASAD LT SHOULDER  12/15/2008   left shoulder   AV FISTULA PLACEMENT Left 08/09/2016   Procedure: BRACHIOCEPHALIC ARTERIOVENOUS (AV) FISTULA CREATION LEFT ARM;  Surgeon: Sherren Kerns, MD;  Location: [facility] OR;  Service: Vascular;  Laterality: Left;   CAPD INSERTION N/A 10/07/2018   Procedure: LAPAROSCOPIC PERITONEAL CATHETER PLACEMENT;  Surgeon: Rodman Pickle, MD;  Location: [facility] ORS;  Service: General;  Laterality: N/A;   CATARACT EXTRACTION W/PHACO Left 03/28/2016   Procedure: CATARACT EXTRACTION PHACO AND INTRAOCULAR LENS PLACEMENT LEFT EYE;  Surgeon: Jethro Bolus, MD;  Location: [facility] ORS;  Service:  Ophthalmology;  Laterality: Left;  CDE: 4.77   CATARACT EXTRACTION W/PHACO Right 04/11/2016   Procedure: CATARACT EXTRACTION PHACO AND INTRAOCULAR LENS PLACEMENT RIGHT EYE; CDE:  4.74;  Surgeon: Jethro Bolus, MD;  Location: [facility]S;  Service: Ophthalmology;  Laterality: Right;   COLONOSCOPY  10/15/2008   Fields: Rectal polyp obliterated, not retrieved, hemorrhoids, single ascending colon diverticulum near the CV. Next colonoscopy April 2020   COLONOSCOPY N/A 12/25/2014   SLF: 1. Colorectal polyps (2) removed 2. Small internal hemorrhoids 3. the left colon is severely redundant. hyperplastic polyps   CORONARY ARTERY BYPASS GRAFT N/A 06/05/2022   Procedure: OFF PUMP CORONARY ARTERY BYPASS GRAFTING (CABG) X 2 BYPASSES USING LEFT INTERNAL MAMMARY ARTERY AND RIGHT  LEG GREATER SAPHENOUS VEIN HARVESTED ENDOSCOPICALLY;  Surgeon: Corliss Skains, MD;  Location: [facility] OR;  Service: Open Heart Surgery;  Laterality: N/A;   CORONARY STENT INTERVENTION N/A 07/25/2021   Procedure: CORONARY STENT INTERVENTION;  Surgeon: Tonny Bollman, MD;  Location: [facility] INVASIVE CV LAB;  Service: Cardiovascular;  Laterality: N/A;   CORONARY STENT INTERVENTION N/A 12/26/2021   Procedure: CORONARY STENT INTERVENTION;  Surgeon: Yvonne Kendall, MD;  Location: [facility] INVASIVE CV LAB;  Service: Cardiovascular;  Laterality: N/A;   CORONARY STENT INTERVENTION N/A 01/20/2022   Procedure: CORONARY STENT INTERVENTION;  Surgeon: Tonny Bollman, MD;  Location: [facility] INVASIVE CV LAB;  Service: Cardiovascular;  Laterality: N/A;   DOPPLER ECHOCARDIOGRAPHY     ESOPHAGOGASTRODUODENOSCOPY N/A 12/25/2014   SLF: 1. Anemia most likely due to CRI, gastritis, gastric polyps 2. Moderate non-erosive gastriits and mild duodenitis.  3.TWo large gstric polyps removed.    EYE SURGERY  12/22/2010   tear duct probing-   FOREIGN BODY REMOVAL  03/29/2011   Procedure: REMOVAL FOREIGN BODY EXTREMITY;  Surgeon: Fuller Canada, MD;  Location: [facility];  Service:  Orthopedics;  Laterality: Right;  Removal Foreign Body Right Thumb   IR FLUORO GUIDE CV LINE RIGHT  08/06/2018   IR FLUORO GUIDE CV LINE RIGHT  02/17/2023   IR US GUIDE VASC ACCESS RIGHT  08/06/2018   IR US GUIDE VASC ACCESS RIGHT  02/17/2023   KNEE ARTHROSCOPY  10/16/2007   left   KNEE ARTHROSCOPY WITH LATERAL MENISECTOMY Right 10/14/2015   Procedure: LEFT KNEE ARTHROSCOPY WITH PARTIAL LATERAL MENISECTOMY;  Surgeon: Vickki Hearing, MD;  Location: [facility];  Service: Orthopedics;  Laterality: Right;   LEFT HEART CATH AND CORONARY ANGIOGRAPHY N/A 07/25/2021   Procedure: LEFT HEART CATH AND CORONARY ANGIOGRAPHY;  Surgeon: Tonny Bollman, MD;  Location: [facility] INVASIVE CV LAB;  Service: Cardiovascular;  Laterality: N/A;   LEFT HEART CATH AND CORONARY ANGIOGRAPHY N/A 12/26/2021   Procedure: LEFT HEART CATH AND CORONARY ANGIOGRAPHY;  Surgeon: Yvonne Kendall, MD;  Location: [facility] INVASIVE CV LAB;  Service: Cardiovascular;  Laterality: N/A;   LEFT HEART CATH AND CORONARY ANGIOGRAPHY N/A 01/20/2022   Procedure: LEFT HEART CATH AND CORONARY ANGIOGRAPHY;  Surgeon: Tonny Bollman, MD;  Location: [facility] INVASIVE CV LAB;  Service: Cardiovascular;  Laterality: N/A;   LEFT HEART CATHETERIZATION WITH CORONARY ANGIOGRAM N/A 03/28/2013   Procedure: LEFT HEART CATHETERIZATION WITH CORONARY ANGIOGRAM;  Surgeon: Marykay Lex, MD;  Location: [facility] CATH LAB;  Service: Cardiovascular;  Laterality: N/A;   NM MYOVIEW LTD     PENILE PROSTHESIS IMPLANT N/A 08/16/2015   Procedure: PENILE PROTHESIS INFLATABLE, three piece, Excisional biopsy of Penile ulcer, Penile molding;  Surgeon: Jethro Bolus, MD;  Location: [facility];  Service: Urology;  Laterality: N/A;   PENILE PROSTHESIS IMPLANT N/A 12/24/2017   Procedure: REMOVAL AND  REPLACEMENT  COLOPLAST PENILE PROSTHESIS;  Surgeon: Crista Elliot, MD;  Location: [facility];  Service: Urology;  Laterality: N/A;   PERIPHERAL VASCULAR BALLOON ANGIOPLASTY  02/28/2023   Procedure: PERIPHERAL  VASCULAR BALLOON ANGIOPLASTY;  Surgeon: Victorino Sparrow, MD;  Location: [facility] INVASIVE CV LAB;  Service: Cardiovascular;;   PORT-A-CATH REMOVAL N/A 02/19/2023   Procedure: REMOVAL OF PERITONEAL DIALYSIS CATHETER;  Surgeon: Victorino Sparrow, MD;  Location: [facility] OR;  Service: Vascular;  Laterality: N/A;   QUADRICEPS TENDON REPAIR  07/21/2011   Procedure: REPAIR QUADRICEP TENDON;  Surgeon: Fuller Canada, MD;  Location: [facility];  Service: Orthopedics;  Laterality: Right;   RIGHT/LEFT HEART CATH  AND CORONARY ANGIOGRAPHY N/A 05/30/2022   Procedure: RIGHT/LEFT HEART CATH AND CORONARY ANGIOGRAPHY;  Surgeon: Corky Crafts, MD;  Location: [facility] INVASIVE CV LAB;  Service: Cardiovascular;  Laterality: N/A;   TEE WITHOUT CARDIOVERSION N/A 06/05/2022   Procedure: TRANSESOPHAGEAL ECHOCARDIOGRAM (TEE);  Surgeon: Corliss Skains, MD;  Location: [facility] OR;  Service: Open Heart Surgery;  Laterality: N/A;   TOENAIL EXCISION     removed x2-bilateral   UMBILICAL HERNIA REPAIR  07/17/2005   [place]     FAMILY HISTORY: Family History  Problem Relation Age of Onset   Hypertension Mother        MI   Cancer Mother        breast    Diabetes Mother    Diabetes Father    Hypertension Father    Hypertension Sister    Diabetes Sister    Arthritis Other    Asthma Other    Lung disease Other    Anesthesia problems Neg Hx    Hypotension Neg Hx    Malignant hyperthermia Neg Hx    Pseudochol deficiency Neg Hx    Colon cancer Neg Hx      SOCIAL HISTORY: Social History   Socioeconomic History   Marital status: Married    Spouse name: Not on file   Number of children: 2   Years of education: 12th grade   Highest education level: Not on file  Occupational History   Occupation: disabled   Occupation:      Associate Professor: UNEMPLOYED  Tobacco Use   Smoking status: Former    Current packs/day: 0.00    Average packs/day: 1 pack/day for 25.0 years (25.0 ttl pk-yrs)    Types: Cigarettes    Start date: 03/27/1985     Quit date: 03/27/2010    Years since quitting: 13.2   Smokeless tobacco: Never   Tobacco comments:    Quit x 7 years  Vaping Use   Vaping status: Never Used  Substance and Sexual Activity   Alcohol use: Not Currently    Comment: occasionally   Drug use: Yes    Types: Marijuana    Comment: cocaine- last time used- 11/24/2017 , marijuana-    Sexual activity: Yes  Other Topics Concern   Not on file  Social History Narrative   He quit smoking in 2010. 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 He developed pulmonary problems, became disabled because of lower airway disease in 2009.       WATCHES BASKETBALL. HIS TEAM IS [employer].   Social Determinants of Health   Financial Resource Strain: Low Risk  (03/05/2023)   Received from [facility]   Overall Financial Resource Strain (CARDIA)    Difficulty of Paying Living Expenses: Not hard at all  Food Insecurity: No Food Insecurity (03/05/2023)   Received from [facility]   Hunger Vital Sign    Worried About Running Out of Food in the Last Year: Never true    Ran Out of Food in the Last Year: Never true  Transportation Needs: No Transportation Needs (03/12/2023)   Received from [facility]   SM SDOH Transportation Source    Has lack of transportation kept you from medical appointments or from getting medications?: No    Has lack of transportation kept you from meetings, work, or from getting things needed for daily living?: No  Physical Activity: Sufficiently Active (12/29/2021)   Exercise Vital Sign    Days of Exercise per  Week: 3 days    Minutes of Exercise per Session: 60 min  Stress: Patient Declined (03/12/2023)   Received from [facility]   [facility] of Occupational Health - Occupational Stress Questionnaire    Feeling of Stress : Patient declined  Social Connections: Moderately Integrated (03/05/2023)   Received from [facility]   Social Connection and Isolation Panel  [NHANES]    Frequency of Communication with Friends and Family: More than three times a week    Frequency of Social Gatherings with Friends and Family: More than three times a week    Attends Religious Services: More than 4 times per year    Active Member of [employer] or Organizations: No    Attends Banker Meetings: Never    Marital Status: Married  Catering manager Violence: Not At Risk (03/02/2023)   Received from [facility]   Domestic Abuse Assessment    Do you feel safe in your relationships at home?: Yes    Physical Abuse: Denies    HRSN Domestic Abuse - Type of Abuse: Not on file    HRSN Domestic Abuse - Time Frame: Not on file    HRSN Domestic Abuse - Signs and Symptoms: Not on file    Verbal Abuse: Denies    HRSN Domestic Abuse - Reported To: Not on file     PHYSICAL EXAM  There were no vitals filed for this visit. There is no height or weight on file to calculate BMI.  Generalized: Well developed, in no acute distress  Cardiology: normal rate and rhythm, no murmur auscultated  Respiratory: clear to auscultation bilaterally    Neurological examination  Mentation: Alert oriented to time, place, history taking. Follows all commands speech and language fluent Cranial nerve II-XII: Pupils were equal round reactive to light. Extraocular movements were full, visual field were full on confrontational test. Facial sensation and strength were normal. Uvula tongue midline. Head turning and shoulder shrug  were normal and symmetric. Motor: The motor testing reveals 5 over 5 strength of all 4 extremities. Good symmetric motor tone is noted throughout.  Sensory: Sensory testing is intact to soft touch on all 4 extremities. No evidence of extinction is noted.  Coordination: Cerebellar testing reveals good finger-nose-finger and heel-to-shin bilaterally.  Gait and station: Gait is normal. Tandem gait is normal. Romberg is negative. No drift is seen.  Reflexes: Deep tendon  reflexes are symmetric and normal bilaterally.    DIAGNOSTIC DATA (LABS, IMAGING, TESTING) - I reviewed patient records, labs, notes, testing and imaging myself where available.  Lab Results  Component Value Date   WBC 4.1 05/05/2023   HGB 9.4 (L) 05/05/2023   HCT 31.8 (L) 05/05/2023   MCV 89.6 05/05/2023   PLT 131 (L) 05/05/2023      Component Value Date/Time   NA 144 05/16/2023 0918   K 4.6 05/16/2023 0918   CL 101 05/16/2023 0918   CO2 25 05/16/2023 0918   GLUCOSE 101 (H) 05/16/2023 0918   GLUCOSE 155 (H) 05/05/2023 0750   BUN 40 (H) 05/16/2023 0918   CREATININE 7.62 (H) 05/16/2023 0918   CREATININE 18.89 (H) 02/09/2020 1126   CALCIUM 9.2 05/16/2023 0918   CALCIUM 9.2 05/05/2023 0750   PROT 6.5 05/16/2023 0918   ALBUMIN 3.7 (L) 05/16/2023 0918   AST 26 05/16/2023 0918   ALT 30 05/16/2023 0918   ALKPHOS 141 (H) 06/21/2023 0920   BILITOT 0.6 05/16/2023 0918   GFRNONAA 4 (L) 05/05/2023 0750   GFRNONAA  2 (L) 02/09/2020 1126   GFRAA <4 (L) 02/09/2020 1126   Lab Results  Component Value Date   CHOL 90 05/03/2023   HDL 38 (L) 05/03/2023   LDLCALC 41 05/03/2023   TRIG 55 05/03/2023   CHOLHDL 2.4 05/03/2023   Lab Results  Component Value Date   HGBA1C 4.9 05/03/2023   Lab Results  Component Value Date   VITAMINB12 629 04/25/2023   Lab Results  Component Value Date   TSH 1.357 04/25/2023        No data to display              09/11/2022   10:58 AM  Montreal Cognitive Assessment   Visuospatial/ Executive (0/5) 2  Naming (0/3) 3  Attention: Read list of digits (0/2) 1  Attention: Read list of letters (0/1) 1  Attention: Serial 7 subtraction starting at 100 (0/3) 3  Language: Repeat phrase (0/2) 1  Language : Fluency (0/1) 0  Abstraction (0/2) 2  Delayed Recall (0/5) 3  Orientation (0/6) 5  Total 21  Adjusted Score (based on education) 21     ASSESSMENT AND PLAN  72 y.o. year old male  has a past medical history of Anemia, Arthritis, Asthma,  Bell palsy, CAD (coronary artery disease), Chronic back pain, Chronic knee pain, Chronic pain, Chronic shoulder pain, Chronic sinusitis, COPD (chronic obstructive pulmonary disease) (HCC), Diabetes mellitus without complication (HCC), ESRD on peritoneal dialysis (HCC), Essential hypertension, GERD (gastroesophageal reflux disease), Gout, Hepatomegaly, History of hiatal hernia, Hyperlipidemia, Lateral meniscus tear, Obesity, Obstructive sleep apnea, On home oxygen therapy, PUD (peptic ulcer disease), Reactive airway disease, Sinusitis, and Vitamin D deficiency. here with    No diagnosis found.  [name] [name] ***.  Healthy lifestyle habits encouraged. *** will follow up with PCP as directed. *** will return to see me in ***, sooner if needed. *** verbalizes understanding and agreement with this plan.   No orders of the defined types were placed in this encounter.    No orders of the defined types were placed in this encounter.    Shawnie Dapper, MSN, FNP-C 06/27/2023, 11:28 AM  [facility] [address], [address] [place], [place] [address] [phone]

## 2023-06-28 ENCOUNTER — Telehealth: Payer: Self-pay | Admitting: Pulmonary Disease

## 2023-06-28 ENCOUNTER — Ambulatory Visit: Payer: Medicare Other | Admitting: Family Medicine

## 2023-06-28 ENCOUNTER — Encounter: Payer: Self-pay | Admitting: Family Medicine

## 2023-06-28 VITALS — BP 119/66 | HR 69 | Ht 64.0 in | Wt 222.0 lb

## 2023-06-28 DIAGNOSIS — I10 Essential (primary) hypertension: Secondary | ICD-10-CM | POA: Diagnosis not present

## 2023-06-28 DIAGNOSIS — E1165 Type 2 diabetes mellitus with hyperglycemia: Secondary | ICD-10-CM | POA: Diagnosis not present

## 2023-06-28 DIAGNOSIS — I2510 Atherosclerotic heart disease of native coronary artery without angina pectoris: Secondary | ICD-10-CM | POA: Diagnosis not present

## 2023-06-28 DIAGNOSIS — R251 Tremor, unspecified: Secondary | ICD-10-CM

## 2023-06-28 DIAGNOSIS — J449 Chronic obstructive pulmonary disease, unspecified: Secondary | ICD-10-CM | POA: Diagnosis not present

## 2023-06-28 DIAGNOSIS — R413 Other amnesia: Secondary | ICD-10-CM

## 2023-06-28 DIAGNOSIS — R269 Unspecified abnormalities of gait and mobility: Secondary | ICD-10-CM

## 2023-06-28 DIAGNOSIS — I509 Heart failure, unspecified: Secondary | ICD-10-CM | POA: Diagnosis not present

## 2023-06-28 DIAGNOSIS — Z1389 Encounter for screening for other disorder: Secondary | ICD-10-CM | POA: Diagnosis not present

## 2023-06-28 DIAGNOSIS — Z1331 Encounter for screening for depression: Secondary | ICD-10-CM | POA: Diagnosis not present

## 2023-06-28 DIAGNOSIS — N186 End stage renal disease: Secondary | ICD-10-CM | POA: Diagnosis not present

## 2023-06-28 LAB — ALKALINE PHOSPHATASE, ISOENZYMES
Alkaline Phosphatase: 141 [IU]/L — ABNORMAL HIGH (ref 44–121)
BONE FRACTION: 38 % (ref 12–68)
INTESTINAL FRAC.: 21 % — ABNORMAL HIGH (ref 0–18)
LIVER FRACTION: 41 % (ref 13–88)

## 2023-06-28 LAB — IRON,TIBC AND FERRITIN PANEL
Ferritin: 957 ng/mL — ABNORMAL HIGH (ref 30–400)
Iron Saturation: 21 % (ref 15–55)
Iron: 63 ug/dL (ref 38–169)
Total Iron Binding Capacity: 301 ug/dL (ref 250–450)
UIBC: 238 ug/dL (ref 111–343)

## 2023-06-28 LAB — IGG, IGA, IGM
IgA/Immunoglobulin A, Serum: 351 mg/dL (ref 61–437)
IgG (Immunoglobin G), Serum: 1570 mg/dL (ref 603–1613)
IgM (Immunoglobulin M), Srm: 107 mg/dL (ref 15–143)

## 2023-06-28 LAB — MITOCHONDRIAL/SMOOTH MUSCLE AB PNL
Mitochondrial Ab: <20 U (ref 0.0–20.0)
Smooth Muscle Ab: 21 U — ABNORMAL HIGH (ref 0–19)

## 2023-06-28 LAB — CERULOPLASMIN: Ceruloplasmin: 31.6 mg/dL — ABNORMAL HIGH (ref 16.0–31.0)

## 2023-06-28 LAB — GAMMA GT: GGT: 88 [IU]/L — ABNORMAL HIGH (ref 0–65)

## 2023-06-28 LAB — ANA: Anti Nuclear Antibody (ANA): NEGATIVE

## 2023-06-28 LAB — ALPHA-1-ANTITRYPSIN: A-1 Antitrypsin: 160 mg/dL (ref 101–187)

## 2023-06-28 NOTE — Telephone Encounter (Signed)
LVM for patient to call and discuss scheduling overdue follow up with Dr. Vassie Loll or APP

## 2023-06-29 DIAGNOSIS — N186 End stage renal disease: Secondary | ICD-10-CM | POA: Diagnosis not present

## 2023-06-29 DIAGNOSIS — Z23 Encounter for immunization: Secondary | ICD-10-CM | POA: Diagnosis not present

## 2023-06-29 DIAGNOSIS — D509 Iron deficiency anemia, unspecified: Secondary | ICD-10-CM | POA: Diagnosis not present

## 2023-06-29 DIAGNOSIS — N2581 Secondary hyperparathyroidism of renal origin: Secondary | ICD-10-CM | POA: Diagnosis not present

## 2023-06-29 DIAGNOSIS — D631 Anemia in chronic kidney disease: Secondary | ICD-10-CM | POA: Diagnosis not present

## 2023-06-29 DIAGNOSIS — Z992 Dependence on renal dialysis: Secondary | ICD-10-CM | POA: Diagnosis not present

## 2023-07-02 DIAGNOSIS — Z23 Encounter for immunization: Secondary | ICD-10-CM | POA: Diagnosis not present

## 2023-07-02 DIAGNOSIS — Z992 Dependence on renal dialysis: Secondary | ICD-10-CM | POA: Diagnosis not present

## 2023-07-02 DIAGNOSIS — N2581 Secondary hyperparathyroidism of renal origin: Secondary | ICD-10-CM | POA: Diagnosis not present

## 2023-07-02 DIAGNOSIS — D631 Anemia in chronic kidney disease: Secondary | ICD-10-CM | POA: Diagnosis not present

## 2023-07-02 DIAGNOSIS — D509 Iron deficiency anemia, unspecified: Secondary | ICD-10-CM | POA: Diagnosis not present

## 2023-07-02 DIAGNOSIS — N186 End stage renal disease: Secondary | ICD-10-CM | POA: Diagnosis not present

## 2023-07-04 DIAGNOSIS — D509 Iron deficiency anemia, unspecified: Secondary | ICD-10-CM | POA: Diagnosis not present

## 2023-07-04 DIAGNOSIS — D631 Anemia in chronic kidney disease: Secondary | ICD-10-CM | POA: Diagnosis not present

## 2023-07-04 DIAGNOSIS — Z23 Encounter for immunization: Secondary | ICD-10-CM | POA: Diagnosis not present

## 2023-07-04 DIAGNOSIS — N186 End stage renal disease: Secondary | ICD-10-CM | POA: Diagnosis not present

## 2023-07-04 DIAGNOSIS — Z992 Dependence on renal dialysis: Secondary | ICD-10-CM | POA: Diagnosis not present

## 2023-07-04 DIAGNOSIS — N2581 Secondary hyperparathyroidism of renal origin: Secondary | ICD-10-CM | POA: Diagnosis not present

## 2023-07-05 DIAGNOSIS — N186 End stage renal disease: Secondary | ICD-10-CM | POA: Diagnosis not present

## 2023-07-05 DIAGNOSIS — G9341 Metabolic encephalopathy: Secondary | ICD-10-CM | POA: Diagnosis not present

## 2023-07-05 DIAGNOSIS — E1122 Type 2 diabetes mellitus with diabetic chronic kidney disease: Secondary | ICD-10-CM | POA: Diagnosis not present

## 2023-07-05 DIAGNOSIS — A419 Sepsis, unspecified organism: Secondary | ICD-10-CM | POA: Diagnosis not present

## 2023-07-05 DIAGNOSIS — I132 Hypertensive heart and chronic kidney disease with heart failure and with stage 5 chronic kidney disease, or end stage renal disease: Secondary | ICD-10-CM | POA: Diagnosis not present

## 2023-07-05 DIAGNOSIS — I5022 Chronic systolic (congestive) heart failure: Secondary | ICD-10-CM | POA: Diagnosis not present

## 2023-07-06 DIAGNOSIS — D509 Iron deficiency anemia, unspecified: Secondary | ICD-10-CM | POA: Diagnosis not present

## 2023-07-06 DIAGNOSIS — Z23 Encounter for immunization: Secondary | ICD-10-CM | POA: Diagnosis not present

## 2023-07-06 DIAGNOSIS — N2581 Secondary hyperparathyroidism of renal origin: Secondary | ICD-10-CM | POA: Diagnosis not present

## 2023-07-06 DIAGNOSIS — Z992 Dependence on renal dialysis: Secondary | ICD-10-CM | POA: Diagnosis not present

## 2023-07-06 DIAGNOSIS — D631 Anemia in chronic kidney disease: Secondary | ICD-10-CM | POA: Diagnosis not present

## 2023-07-06 DIAGNOSIS — N186 End stage renal disease: Secondary | ICD-10-CM | POA: Diagnosis not present

## 2023-07-09 ENCOUNTER — Other Ambulatory Visit: Payer: Self-pay

## 2023-07-09 DIAGNOSIS — D631 Anemia in chronic kidney disease: Secondary | ICD-10-CM | POA: Diagnosis not present

## 2023-07-09 DIAGNOSIS — Z992 Dependence on renal dialysis: Secondary | ICD-10-CM | POA: Diagnosis not present

## 2023-07-09 DIAGNOSIS — N186 End stage renal disease: Secondary | ICD-10-CM | POA: Diagnosis not present

## 2023-07-09 DIAGNOSIS — N2581 Secondary hyperparathyroidism of renal origin: Secondary | ICD-10-CM | POA: Diagnosis not present

## 2023-07-09 DIAGNOSIS — D509 Iron deficiency anemia, unspecified: Secondary | ICD-10-CM | POA: Diagnosis not present

## 2023-07-09 DIAGNOSIS — Z23 Encounter for immunization: Secondary | ICD-10-CM | POA: Diagnosis not present

## 2023-07-09 MED ORDER — ESOMEPRAZOLE MAGNESIUM 40 MG PO CPDR: 40.0000 mg | DELAYED_RELEASE_CAPSULE | Freq: Two times a day (BID) | ORAL | 0 refills | Status: DC

## 2023-07-12 DIAGNOSIS — D631 Anemia in chronic kidney disease: Secondary | ICD-10-CM | POA: Diagnosis not present

## 2023-07-12 DIAGNOSIS — N2581 Secondary hyperparathyroidism of renal origin: Secondary | ICD-10-CM | POA: Diagnosis not present

## 2023-07-12 DIAGNOSIS — Z992 Dependence on renal dialysis: Secondary | ICD-10-CM | POA: Diagnosis not present

## 2023-07-12 DIAGNOSIS — D509 Iron deficiency anemia, unspecified: Secondary | ICD-10-CM | POA: Diagnosis not present

## 2023-07-12 DIAGNOSIS — N186 End stage renal disease: Secondary | ICD-10-CM | POA: Diagnosis not present

## 2023-07-12 DIAGNOSIS — Z23 Encounter for immunization: Secondary | ICD-10-CM | POA: Diagnosis not present

## 2023-07-13 DIAGNOSIS — Z992 Dependence on renal dialysis: Secondary | ICD-10-CM | POA: Diagnosis not present

## 2023-07-13 DIAGNOSIS — N186 End stage renal disease: Secondary | ICD-10-CM | POA: Diagnosis not present

## 2023-07-13 DIAGNOSIS — D631 Anemia in chronic kidney disease: Secondary | ICD-10-CM | POA: Diagnosis not present

## 2023-07-13 DIAGNOSIS — D509 Iron deficiency anemia, unspecified: Secondary | ICD-10-CM | POA: Diagnosis not present

## 2023-07-13 DIAGNOSIS — Z23 Encounter for immunization: Secondary | ICD-10-CM | POA: Diagnosis not present

## 2023-07-13 DIAGNOSIS — N2581 Secondary hyperparathyroidism of renal origin: Secondary | ICD-10-CM | POA: Diagnosis not present

## 2023-07-16 ENCOUNTER — Other Ambulatory Visit: Payer: Self-pay

## 2023-07-16 DIAGNOSIS — N2581 Secondary hyperparathyroidism of renal origin: Secondary | ICD-10-CM | POA: Diagnosis not present

## 2023-07-16 DIAGNOSIS — N186 End stage renal disease: Secondary | ICD-10-CM | POA: Diagnosis not present

## 2023-07-16 DIAGNOSIS — D509 Iron deficiency anemia, unspecified: Secondary | ICD-10-CM | POA: Diagnosis not present

## 2023-07-16 DIAGNOSIS — D631 Anemia in chronic kidney disease: Secondary | ICD-10-CM | POA: Diagnosis not present

## 2023-07-16 DIAGNOSIS — Z23 Encounter for immunization: Secondary | ICD-10-CM | POA: Diagnosis not present

## 2023-07-16 DIAGNOSIS — Z992 Dependence on renal dialysis: Secondary | ICD-10-CM | POA: Diagnosis not present

## 2023-07-16 MED ORDER — ESOMEPRAZOLE MAGNESIUM 40 MG PO CPDR: 40.0000 mg | DELAYED_RELEASE_CAPSULE | Freq: Two times a day (BID) | ORAL | 0 refills | Status: DC

## 2023-07-17 DIAGNOSIS — N186 End stage renal disease: Secondary | ICD-10-CM | POA: Diagnosis not present

## 2023-07-17 DIAGNOSIS — Z992 Dependence on renal dialysis: Secondary | ICD-10-CM | POA: Diagnosis not present

## 2023-07-17 DIAGNOSIS — E8770 Fluid overload, unspecified: Secondary | ICD-10-CM | POA: Diagnosis not present

## 2023-07-18 DIAGNOSIS — D631 Anemia in chronic kidney disease: Secondary | ICD-10-CM | POA: Diagnosis not present

## 2023-07-18 DIAGNOSIS — D509 Iron deficiency anemia, unspecified: Secondary | ICD-10-CM | POA: Diagnosis not present

## 2023-07-18 DIAGNOSIS — Z992 Dependence on renal dialysis: Secondary | ICD-10-CM | POA: Diagnosis not present

## 2023-07-18 DIAGNOSIS — N2581 Secondary hyperparathyroidism of renal origin: Secondary | ICD-10-CM | POA: Diagnosis not present

## 2023-07-18 DIAGNOSIS — N250 Renal osteodystrophy: Secondary | ICD-10-CM | POA: Diagnosis not present

## 2023-07-18 DIAGNOSIS — N186 End stage renal disease: Secondary | ICD-10-CM | POA: Diagnosis not present

## 2023-07-20 DIAGNOSIS — N186 End stage renal disease: Secondary | ICD-10-CM | POA: Diagnosis not present

## 2023-07-20 DIAGNOSIS — D509 Iron deficiency anemia, unspecified: Secondary | ICD-10-CM | POA: Diagnosis not present

## 2023-07-20 DIAGNOSIS — D631 Anemia in chronic kidney disease: Secondary | ICD-10-CM | POA: Diagnosis not present

## 2023-07-20 DIAGNOSIS — N2581 Secondary hyperparathyroidism of renal origin: Secondary | ICD-10-CM | POA: Diagnosis not present

## 2023-07-20 DIAGNOSIS — Z992 Dependence on renal dialysis: Secondary | ICD-10-CM | POA: Diagnosis not present

## 2023-07-20 DIAGNOSIS — N250 Renal osteodystrophy: Secondary | ICD-10-CM | POA: Diagnosis not present

## 2023-07-23 DIAGNOSIS — D509 Iron deficiency anemia, unspecified: Secondary | ICD-10-CM | POA: Diagnosis not present

## 2023-07-23 DIAGNOSIS — Z992 Dependence on renal dialysis: Secondary | ICD-10-CM | POA: Diagnosis not present

## 2023-07-23 DIAGNOSIS — N250 Renal osteodystrophy: Secondary | ICD-10-CM | POA: Diagnosis not present

## 2023-07-23 DIAGNOSIS — N186 End stage renal disease: Secondary | ICD-10-CM | POA: Diagnosis not present

## 2023-07-23 DIAGNOSIS — D631 Anemia in chronic kidney disease: Secondary | ICD-10-CM | POA: Diagnosis not present

## 2023-07-23 DIAGNOSIS — N2581 Secondary hyperparathyroidism of renal origin: Secondary | ICD-10-CM | POA: Diagnosis not present

## 2023-07-24 DIAGNOSIS — H524 Presbyopia: Secondary | ICD-10-CM | POA: Diagnosis not present

## 2023-07-24 DIAGNOSIS — E119 Type 2 diabetes mellitus without complications: Secondary | ICD-10-CM | POA: Diagnosis not present

## 2023-07-25 DIAGNOSIS — N250 Renal osteodystrophy: Secondary | ICD-10-CM | POA: Diagnosis not present

## 2023-07-25 DIAGNOSIS — N2581 Secondary hyperparathyroidism of renal origin: Secondary | ICD-10-CM | POA: Diagnosis not present

## 2023-07-25 DIAGNOSIS — D509 Iron deficiency anemia, unspecified: Secondary | ICD-10-CM | POA: Diagnosis not present

## 2023-07-25 DIAGNOSIS — D631 Anemia in chronic kidney disease: Secondary | ICD-10-CM | POA: Diagnosis not present

## 2023-07-25 DIAGNOSIS — N186 End stage renal disease: Secondary | ICD-10-CM | POA: Diagnosis not present

## 2023-07-25 DIAGNOSIS — Z992 Dependence on renal dialysis: Secondary | ICD-10-CM | POA: Diagnosis not present

## 2023-07-26 DIAGNOSIS — N186 End stage renal disease: Secondary | ICD-10-CM | POA: Diagnosis not present

## 2023-07-26 DIAGNOSIS — Z992 Dependence on renal dialysis: Secondary | ICD-10-CM | POA: Diagnosis not present

## 2023-07-26 DIAGNOSIS — E8779 Other fluid overload: Secondary | ICD-10-CM | POA: Diagnosis not present

## 2023-07-27 DIAGNOSIS — D509 Iron deficiency anemia, unspecified: Secondary | ICD-10-CM | POA: Diagnosis not present

## 2023-07-27 DIAGNOSIS — N2581 Secondary hyperparathyroidism of renal origin: Secondary | ICD-10-CM | POA: Diagnosis not present

## 2023-07-27 DIAGNOSIS — N250 Renal osteodystrophy: Secondary | ICD-10-CM | POA: Diagnosis not present

## 2023-07-27 DIAGNOSIS — N186 End stage renal disease: Secondary | ICD-10-CM | POA: Diagnosis not present

## 2023-07-27 DIAGNOSIS — Z992 Dependence on renal dialysis: Secondary | ICD-10-CM | POA: Diagnosis not present

## 2023-07-27 DIAGNOSIS — D631 Anemia in chronic kidney disease: Secondary | ICD-10-CM | POA: Diagnosis not present

## 2023-07-30 DIAGNOSIS — D509 Iron deficiency anemia, unspecified: Secondary | ICD-10-CM | POA: Diagnosis not present

## 2023-07-30 DIAGNOSIS — D631 Anemia in chronic kidney disease: Secondary | ICD-10-CM | POA: Diagnosis not present

## 2023-07-30 DIAGNOSIS — N250 Renal osteodystrophy: Secondary | ICD-10-CM | POA: Diagnosis not present

## 2023-07-30 DIAGNOSIS — N186 End stage renal disease: Secondary | ICD-10-CM | POA: Diagnosis not present

## 2023-07-30 DIAGNOSIS — Z992 Dependence on renal dialysis: Secondary | ICD-10-CM | POA: Diagnosis not present

## 2023-07-30 DIAGNOSIS — N2581 Secondary hyperparathyroidism of renal origin: Secondary | ICD-10-CM | POA: Diagnosis not present

## 2023-08-01 DIAGNOSIS — N2581 Secondary hyperparathyroidism of renal origin: Secondary | ICD-10-CM | POA: Diagnosis not present

## 2023-08-01 DIAGNOSIS — N186 End stage renal disease: Secondary | ICD-10-CM | POA: Diagnosis not present

## 2023-08-01 DIAGNOSIS — D631 Anemia in chronic kidney disease: Secondary | ICD-10-CM | POA: Diagnosis not present

## 2023-08-01 DIAGNOSIS — Z992 Dependence on renal dialysis: Secondary | ICD-10-CM | POA: Diagnosis not present

## 2023-08-01 DIAGNOSIS — N250 Renal osteodystrophy: Secondary | ICD-10-CM | POA: Diagnosis not present

## 2023-08-01 DIAGNOSIS — D509 Iron deficiency anemia, unspecified: Secondary | ICD-10-CM | POA: Diagnosis not present

## 2023-08-03 DIAGNOSIS — Z992 Dependence on renal dialysis: Secondary | ICD-10-CM | POA: Diagnosis not present

## 2023-08-03 DIAGNOSIS — D631 Anemia in chronic kidney disease: Secondary | ICD-10-CM | POA: Diagnosis not present

## 2023-08-03 DIAGNOSIS — N250 Renal osteodystrophy: Secondary | ICD-10-CM | POA: Diagnosis not present

## 2023-08-03 DIAGNOSIS — D509 Iron deficiency anemia, unspecified: Secondary | ICD-10-CM | POA: Diagnosis not present

## 2023-08-03 DIAGNOSIS — N2581 Secondary hyperparathyroidism of renal origin: Secondary | ICD-10-CM | POA: Diagnosis not present

## 2023-08-03 DIAGNOSIS — N186 End stage renal disease: Secondary | ICD-10-CM | POA: Diagnosis not present

## 2023-08-06 DIAGNOSIS — N2581 Secondary hyperparathyroidism of renal origin: Secondary | ICD-10-CM | POA: Diagnosis not present

## 2023-08-06 DIAGNOSIS — Z992 Dependence on renal dialysis: Secondary | ICD-10-CM | POA: Diagnosis not present

## 2023-08-06 DIAGNOSIS — D631 Anemia in chronic kidney disease: Secondary | ICD-10-CM | POA: Diagnosis not present

## 2023-08-06 DIAGNOSIS — D509 Iron deficiency anemia, unspecified: Secondary | ICD-10-CM | POA: Diagnosis not present

## 2023-08-06 DIAGNOSIS — N250 Renal osteodystrophy: Secondary | ICD-10-CM | POA: Diagnosis not present

## 2023-08-06 DIAGNOSIS — N186 End stage renal disease: Secondary | ICD-10-CM | POA: Diagnosis not present

## 2023-08-08 DIAGNOSIS — D631 Anemia in chronic kidney disease: Secondary | ICD-10-CM | POA: Diagnosis not present

## 2023-08-08 DIAGNOSIS — Z992 Dependence on renal dialysis: Secondary | ICD-10-CM | POA: Diagnosis not present

## 2023-08-08 DIAGNOSIS — N186 End stage renal disease: Secondary | ICD-10-CM | POA: Diagnosis not present

## 2023-08-08 DIAGNOSIS — N2581 Secondary hyperparathyroidism of renal origin: Secondary | ICD-10-CM | POA: Diagnosis not present

## 2023-08-08 DIAGNOSIS — D509 Iron deficiency anemia, unspecified: Secondary | ICD-10-CM | POA: Diagnosis not present

## 2023-08-08 DIAGNOSIS — N250 Renal osteodystrophy: Secondary | ICD-10-CM | POA: Diagnosis not present

## 2023-08-09 DIAGNOSIS — J060 Acute laryngopharyngitis: Secondary | ICD-10-CM | POA: Diagnosis not present

## 2023-08-09 DIAGNOSIS — E1165 Type 2 diabetes mellitus with hyperglycemia: Secondary | ICD-10-CM | POA: Diagnosis not present

## 2023-08-10 DIAGNOSIS — D631 Anemia in chronic kidney disease: Secondary | ICD-10-CM | POA: Diagnosis not present

## 2023-08-10 DIAGNOSIS — Z992 Dependence on renal dialysis: Secondary | ICD-10-CM | POA: Diagnosis not present

## 2023-08-10 DIAGNOSIS — N2581 Secondary hyperparathyroidism of renal origin: Secondary | ICD-10-CM | POA: Diagnosis not present

## 2023-08-10 DIAGNOSIS — N186 End stage renal disease: Secondary | ICD-10-CM | POA: Diagnosis not present

## 2023-08-10 DIAGNOSIS — D509 Iron deficiency anemia, unspecified: Secondary | ICD-10-CM | POA: Diagnosis not present

## 2023-08-10 DIAGNOSIS — N250 Renal osteodystrophy: Secondary | ICD-10-CM | POA: Diagnosis not present

## 2023-08-13 DIAGNOSIS — Z992 Dependence on renal dialysis: Secondary | ICD-10-CM | POA: Diagnosis not present

## 2023-08-13 DIAGNOSIS — N250 Renal osteodystrophy: Secondary | ICD-10-CM | POA: Diagnosis not present

## 2023-08-13 DIAGNOSIS — N186 End stage renal disease: Secondary | ICD-10-CM | POA: Diagnosis not present

## 2023-08-13 DIAGNOSIS — D509 Iron deficiency anemia, unspecified: Secondary | ICD-10-CM | POA: Diagnosis not present

## 2023-08-13 DIAGNOSIS — D631 Anemia in chronic kidney disease: Secondary | ICD-10-CM | POA: Diagnosis not present

## 2023-08-13 DIAGNOSIS — N2581 Secondary hyperparathyroidism of renal origin: Secondary | ICD-10-CM | POA: Diagnosis not present

## 2023-08-14 ENCOUNTER — Ambulatory Visit: Payer: Medicare Other | Admitting: Gastroenterology

## 2023-08-14 ENCOUNTER — Ambulatory Visit (HOSPITAL_COMMUNITY)
Admission: RE | Admit: 2023-08-14 | Discharge: 2023-08-14 | Disposition: A | Discharge status: Home / Self Care | Payer: Medicare Other | Source: Ambulatory Visit | Attending: Gerontology | Admitting: Gerontology

## 2023-08-14 ENCOUNTER — Other Ambulatory Visit (HOSPITAL_COMMUNITY): Payer: Self-pay | Admitting: Gerontology

## 2023-08-14 DIAGNOSIS — R918 Other nonspecific abnormal finding of lung field: Secondary | ICD-10-CM | POA: Diagnosis not present

## 2023-08-14 DIAGNOSIS — Z951 Presence of aortocoronary bypass graft: Secondary | ICD-10-CM | POA: Diagnosis not present

## 2023-08-14 DIAGNOSIS — B351 Tinea unguium: Secondary | ICD-10-CM | POA: Diagnosis not present

## 2023-08-14 DIAGNOSIS — I517 Cardiomegaly: Secondary | ICD-10-CM | POA: Diagnosis not present

## 2023-08-14 DIAGNOSIS — E1142 Type 2 diabetes mellitus with diabetic polyneuropathy: Secondary | ICD-10-CM | POA: Diagnosis not present

## 2023-08-14 DIAGNOSIS — R059 Cough, unspecified: Secondary | ICD-10-CM | POA: Diagnosis not present

## 2023-08-15 ENCOUNTER — Ambulatory Visit: Payer: Medicare Other | Admitting: Gastroenterology

## 2023-08-15 DIAGNOSIS — D631 Anemia in chronic kidney disease: Secondary | ICD-10-CM | POA: Diagnosis not present

## 2023-08-15 DIAGNOSIS — Z992 Dependence on renal dialysis: Secondary | ICD-10-CM | POA: Diagnosis not present

## 2023-08-15 DIAGNOSIS — N2581 Secondary hyperparathyroidism of renal origin: Secondary | ICD-10-CM | POA: Diagnosis not present

## 2023-08-15 DIAGNOSIS — N250 Renal osteodystrophy: Secondary | ICD-10-CM | POA: Diagnosis not present

## 2023-08-15 DIAGNOSIS — D509 Iron deficiency anemia, unspecified: Secondary | ICD-10-CM | POA: Diagnosis not present

## 2023-08-15 DIAGNOSIS — N186 End stage renal disease: Secondary | ICD-10-CM | POA: Diagnosis not present

## 2023-08-17 DIAGNOSIS — N2581 Secondary hyperparathyroidism of renal origin: Secondary | ICD-10-CM | POA: Diagnosis not present

## 2023-08-17 DIAGNOSIS — Z992 Dependence on renal dialysis: Secondary | ICD-10-CM | POA: Diagnosis not present

## 2023-08-17 DIAGNOSIS — D631 Anemia in chronic kidney disease: Secondary | ICD-10-CM | POA: Diagnosis not present

## 2023-08-17 DIAGNOSIS — N250 Renal osteodystrophy: Secondary | ICD-10-CM | POA: Diagnosis not present

## 2023-08-17 DIAGNOSIS — N186 End stage renal disease: Secondary | ICD-10-CM | POA: Diagnosis not present

## 2023-08-17 DIAGNOSIS — D509 Iron deficiency anemia, unspecified: Secondary | ICD-10-CM | POA: Diagnosis not present

## 2023-08-18 DIAGNOSIS — N250 Renal osteodystrophy: Secondary | ICD-10-CM | POA: Diagnosis not present

## 2023-08-18 DIAGNOSIS — Z992 Dependence on renal dialysis: Secondary | ICD-10-CM | POA: Diagnosis not present

## 2023-08-18 DIAGNOSIS — D631 Anemia in chronic kidney disease: Secondary | ICD-10-CM | POA: Diagnosis not present

## 2023-08-18 DIAGNOSIS — D509 Iron deficiency anemia, unspecified: Secondary | ICD-10-CM | POA: Diagnosis not present

## 2023-08-18 DIAGNOSIS — N186 End stage renal disease: Secondary | ICD-10-CM | POA: Diagnosis not present

## 2023-08-18 DIAGNOSIS — N2581 Secondary hyperparathyroidism of renal origin: Secondary | ICD-10-CM | POA: Diagnosis not present

## 2023-08-20 DIAGNOSIS — D509 Iron deficiency anemia, unspecified: Secondary | ICD-10-CM | POA: Diagnosis not present

## 2023-08-20 DIAGNOSIS — N186 End stage renal disease: Secondary | ICD-10-CM | POA: Diagnosis not present

## 2023-08-20 DIAGNOSIS — D631 Anemia in chronic kidney disease: Secondary | ICD-10-CM | POA: Diagnosis not present

## 2023-08-20 DIAGNOSIS — N250 Renal osteodystrophy: Secondary | ICD-10-CM | POA: Diagnosis not present

## 2023-08-20 DIAGNOSIS — Z992 Dependence on renal dialysis: Secondary | ICD-10-CM | POA: Diagnosis not present

## 2023-08-20 DIAGNOSIS — N2581 Secondary hyperparathyroidism of renal origin: Secondary | ICD-10-CM | POA: Diagnosis not present

## 2023-08-22 DIAGNOSIS — N186 End stage renal disease: Secondary | ICD-10-CM | POA: Diagnosis not present

## 2023-08-22 DIAGNOSIS — Z992 Dependence on renal dialysis: Secondary | ICD-10-CM | POA: Diagnosis not present

## 2023-08-22 DIAGNOSIS — D509 Iron deficiency anemia, unspecified: Secondary | ICD-10-CM | POA: Diagnosis not present

## 2023-08-22 DIAGNOSIS — N2581 Secondary hyperparathyroidism of renal origin: Secondary | ICD-10-CM | POA: Diagnosis not present

## 2023-08-22 DIAGNOSIS — D631 Anemia in chronic kidney disease: Secondary | ICD-10-CM | POA: Diagnosis not present

## 2023-08-22 DIAGNOSIS — N250 Renal osteodystrophy: Secondary | ICD-10-CM | POA: Diagnosis not present

## 2023-08-24 ENCOUNTER — Emergency Department (HOSPITAL_COMMUNITY): Payer: Medicare Other

## 2023-08-24 ENCOUNTER — Inpatient Hospital Stay (HOSPITAL_COMMUNITY)
Admission: EM | Admit: 2023-08-24 | Discharge: 2023-08-26 | DRG: 193 | Disposition: A | Discharge status: Home / Self Care | Payer: Medicare Other | Attending: Family Medicine | Admitting: Family Medicine

## 2023-08-24 ENCOUNTER — Encounter (HOSPITAL_COMMUNITY): Payer: Self-pay | Admitting: *Deleted

## 2023-08-24 ENCOUNTER — Other Ambulatory Visit: Payer: Self-pay

## 2023-08-24 DIAGNOSIS — R918 Other nonspecific abnormal finding of lung field: Secondary | ICD-10-CM | POA: Diagnosis not present

## 2023-08-24 DIAGNOSIS — E66812 Obesity, class 2: Secondary | ICD-10-CM | POA: Diagnosis present

## 2023-08-24 DIAGNOSIS — Z7982 Long term (current) use of aspirin: Secondary | ICD-10-CM | POA: Diagnosis not present

## 2023-08-24 DIAGNOSIS — J188 Other pneumonia, unspecified organism: Secondary | ICD-10-CM | POA: Diagnosis present

## 2023-08-24 DIAGNOSIS — D509 Iron deficiency anemia, unspecified: Secondary | ICD-10-CM | POA: Diagnosis not present

## 2023-08-24 DIAGNOSIS — G4733 Obstructive sleep apnea (adult) (pediatric): Secondary | ICD-10-CM

## 2023-08-24 DIAGNOSIS — J101 Influenza due to other identified influenza virus with other respiratory manifestations: Secondary | ICD-10-CM | POA: Diagnosis not present

## 2023-08-24 DIAGNOSIS — R0902 Hypoxemia: Secondary | ICD-10-CM | POA: Diagnosis not present

## 2023-08-24 DIAGNOSIS — I132 Hypertensive heart and chronic kidney disease with heart failure and with stage 5 chronic kidney disease, or end stage renal disease: Secondary | ICD-10-CM | POA: Diagnosis not present

## 2023-08-24 DIAGNOSIS — Z885 Allergy status to narcotic agent status: Secondary | ICD-10-CM

## 2023-08-24 DIAGNOSIS — Z96651 Presence of right artificial knee joint: Secondary | ICD-10-CM | POA: Diagnosis present

## 2023-08-24 DIAGNOSIS — Z992 Dependence on renal dialysis: Secondary | ICD-10-CM | POA: Diagnosis not present

## 2023-08-24 DIAGNOSIS — J210 Acute bronchiolitis due to respiratory syncytial virus: Secondary | ICD-10-CM | POA: Diagnosis not present

## 2023-08-24 DIAGNOSIS — Z7951 Long term (current) use of inhaled steroids: Secondary | ICD-10-CM

## 2023-08-24 DIAGNOSIS — M1990 Unspecified osteoarthritis, unspecified site: Secondary | ICD-10-CM | POA: Diagnosis present

## 2023-08-24 DIAGNOSIS — Z87891 Personal history of nicotine dependence: Secondary | ICD-10-CM | POA: Diagnosis not present

## 2023-08-24 DIAGNOSIS — E785 Hyperlipidemia, unspecified: Secondary | ICD-10-CM | POA: Diagnosis present

## 2023-08-24 DIAGNOSIS — Z8249 Family history of ischemic heart disease and other diseases of the circulatory system: Secondary | ICD-10-CM

## 2023-08-24 DIAGNOSIS — J9601 Acute respiratory failure with hypoxia: Secondary | ICD-10-CM | POA: Diagnosis not present

## 2023-08-24 DIAGNOSIS — J1000 Influenza due to other identified influenza virus with unspecified type of pneumonia: Principal | ICD-10-CM | POA: Diagnosis present

## 2023-08-24 DIAGNOSIS — Z951 Presence of aortocoronary bypass graft: Secondary | ICD-10-CM

## 2023-08-24 DIAGNOSIS — I5020 Unspecified systolic (congestive) heart failure: Secondary | ICD-10-CM | POA: Diagnosis not present

## 2023-08-24 DIAGNOSIS — I480 Paroxysmal atrial fibrillation: Secondary | ICD-10-CM | POA: Diagnosis present

## 2023-08-24 DIAGNOSIS — I10 Essential (primary) hypertension: Secondary | ICD-10-CM | POA: Diagnosis present

## 2023-08-24 DIAGNOSIS — Z794 Long term (current) use of insulin: Secondary | ICD-10-CM | POA: Diagnosis not present

## 2023-08-24 DIAGNOSIS — I517 Cardiomegaly: Secondary | ICD-10-CM | POA: Diagnosis not present

## 2023-08-24 DIAGNOSIS — Z6838 Body mass index (BMI) 38.0-38.9, adult: Secondary | ICD-10-CM

## 2023-08-24 DIAGNOSIS — B338 Other specified viral diseases: Secondary | ICD-10-CM | POA: Diagnosis not present

## 2023-08-24 DIAGNOSIS — E1122 Type 2 diabetes mellitus with diabetic chronic kidney disease: Secondary | ICD-10-CM | POA: Diagnosis not present

## 2023-08-24 DIAGNOSIS — J111 Influenza due to unidentified influenza virus with other respiratory manifestations: Secondary | ICD-10-CM

## 2023-08-24 DIAGNOSIS — Z833 Family history of diabetes mellitus: Secondary | ICD-10-CM

## 2023-08-24 DIAGNOSIS — I252 Old myocardial infarction: Secondary | ICD-10-CM

## 2023-08-24 DIAGNOSIS — N186 End stage renal disease: Principal | ICD-10-CM

## 2023-08-24 DIAGNOSIS — R0600 Dyspnea, unspecified: Secondary | ICD-10-CM | POA: Diagnosis not present

## 2023-08-24 DIAGNOSIS — N250 Renal osteodystrophy: Secondary | ICD-10-CM | POA: Diagnosis not present

## 2023-08-24 DIAGNOSIS — J440 Chronic obstructive pulmonary disease with acute lower respiratory infection: Secondary | ICD-10-CM | POA: Diagnosis present

## 2023-08-24 DIAGNOSIS — M109 Gout, unspecified: Secondary | ICD-10-CM | POA: Diagnosis present

## 2023-08-24 DIAGNOSIS — G8929 Other chronic pain: Secondary | ICD-10-CM | POA: Diagnosis present

## 2023-08-24 DIAGNOSIS — N2581 Secondary hyperparathyroidism of renal origin: Secondary | ICD-10-CM | POA: Diagnosis not present

## 2023-08-24 DIAGNOSIS — R059 Cough, unspecified: Secondary | ICD-10-CM | POA: Diagnosis not present

## 2023-08-24 DIAGNOSIS — M549 Dorsalgia, unspecified: Secondary | ICD-10-CM | POA: Diagnosis present

## 2023-08-24 DIAGNOSIS — J441 Chronic obstructive pulmonary disease with (acute) exacerbation: Principal | ICD-10-CM | POA: Diagnosis present

## 2023-08-24 DIAGNOSIS — K219 Gastro-esophageal reflux disease without esophagitis: Secondary | ICD-10-CM | POA: Diagnosis present

## 2023-08-24 DIAGNOSIS — I2510 Atherosclerotic heart disease of native coronary artery without angina pectoris: Secondary | ICD-10-CM | POA: Diagnosis not present

## 2023-08-24 DIAGNOSIS — Z79899 Other long term (current) drug therapy: Secondary | ICD-10-CM

## 2023-08-24 DIAGNOSIS — I214 Non-ST elevation (NSTEMI) myocardial infarction: Secondary | ICD-10-CM | POA: Diagnosis not present

## 2023-08-24 DIAGNOSIS — Z825 Family history of asthma and other chronic lower respiratory diseases: Secondary | ICD-10-CM

## 2023-08-24 DIAGNOSIS — J1008 Influenza due to other identified influenza virus with other specified pneumonia: Secondary | ICD-10-CM | POA: Diagnosis present

## 2023-08-24 DIAGNOSIS — Z803 Family history of malignant neoplasm of breast: Secondary | ICD-10-CM

## 2023-08-24 DIAGNOSIS — I120 Hypertensive chronic kidney disease with stage 5 chronic kidney disease or end stage renal disease: Secondary | ICD-10-CM | POA: Diagnosis not present

## 2023-08-24 DIAGNOSIS — I5022 Chronic systolic (congestive) heart failure: Secondary | ICD-10-CM | POA: Diagnosis present

## 2023-08-24 DIAGNOSIS — Z955 Presence of coronary angioplasty implant and graft: Secondary | ICD-10-CM

## 2023-08-24 DIAGNOSIS — D631 Anemia in chronic kidney disease: Secondary | ICD-10-CM | POA: Diagnosis not present

## 2023-08-24 DIAGNOSIS — Z9981 Dependence on supplemental oxygen: Secondary | ICD-10-CM

## 2023-08-24 DIAGNOSIS — A419 Sepsis, unspecified organism: Secondary | ICD-10-CM | POA: Diagnosis not present

## 2023-08-24 LAB — COMPREHENSIVE METABOLIC PANEL
ALT: 22 U/L (ref 0–44)
AST: 28 U/L (ref 15–41)
Albumin: 3.1 g/dL — ABNORMAL LOW (ref 3.5–5.0)
Alkaline Phosphatase: 70 U/L (ref 38–126)
Anion gap: 13 (ref 5–15)
BUN: 15 mg/dL (ref 8–23)
CO2: 28 mmol/L (ref 22–32)
Calcium: 8.5 mg/dL — ABNORMAL LOW (ref 8.9–10.3)
Chloride: 96 mmol/L — ABNORMAL LOW (ref 98–111)
Creatinine, Ser: 4.82 mg/dL — ABNORMAL HIGH (ref 0.61–1.24)
GFR, Estimated: 12 mL/min — ABNORMAL LOW (ref >60)
Glucose, Bld: 121 mg/dL — ABNORMAL HIGH (ref 70–99)
Potassium: 3.7 mmol/L (ref 3.5–5.1)
Sodium: 137 mmol/L (ref 135–145)
Total Bilirubin: 0.9 mg/dL (ref 0.0–1.2)
Total Protein: 7.9 g/dL (ref 6.5–8.1)

## 2023-08-24 LAB — CBC WITH DIFFERENTIAL/PLATELET
Abs Immature Granulocytes: 0.03 10*3/uL (ref 0.00–0.07)
Basophils Absolute: 0.1 10*3/uL (ref 0.0–0.1)
Basophils Relative: 1 %
Eosinophils Absolute: 0.2 10*3/uL (ref 0.0–0.5)
Eosinophils Relative: 3 %
HCT: 26.6 % — ABNORMAL LOW (ref 39.0–52.0)
Hemoglobin: 8.4 g/dL — ABNORMAL LOW (ref 13.0–17.0)
Immature Granulocytes: 1 %
Lymphocytes Relative: 7 %
Lymphs Abs: 0.4 10*3/uL — ABNORMAL LOW (ref 0.7–4.0)
MCH: 26.6 pg (ref 26.0–34.0)
MCHC: 31.6 g/dL (ref 30.0–36.0)
MCV: 84.2 fL (ref 80.0–100.0)
Monocytes Absolute: 0.6 10*3/uL (ref 0.1–1.0)
Monocytes Relative: 11 %
Neutro Abs: 4 10*3/uL (ref 1.7–7.7)
Neutrophils Relative %: 77 %
Platelets: 216 10*3/uL (ref 150–400)
RBC: 3.16 MIL/uL — ABNORMAL LOW (ref 4.22–5.81)
RDW: 17.1 % — ABNORMAL HIGH (ref 11.5–15.5)
WBC: 5.2 10*3/uL (ref 4.0–10.5)
nRBC: 0 % (ref 0.0–0.2)

## 2023-08-24 LAB — RESP PANEL BY RT-PCR (RSV, FLU A&B, COVID)  RVPGX2
Influenza A by PCR: POSITIVE — AB
Influenza B by PCR: NEGATIVE
Resp Syncytial Virus by PCR: POSITIVE — AB
SARS Coronavirus 2 by RT PCR: NEGATIVE

## 2023-08-24 LAB — LACTIC ACID, PLASMA
Lactic Acid, Venous: 1.4 mmol/L (ref 0.5–1.9)
Lactic Acid, Venous: 2.3 mmol/L (ref 0.5–1.9)
Lactic Acid, Venous: 1.2 mmol/L (ref 0.5–1.9)

## 2023-08-24 LAB — PROCALCITONIN: Procalcitonin: 1.15 ng/mL

## 2023-08-24 LAB — PROTIME-INR
INR: 1.2 (ref 0.8–1.2)
Prothrombin Time: 15.1 s (ref 11.4–15.2)

## 2023-08-24 LAB — APTT: aPTT: 28 s (ref 24–36)

## 2023-08-24 LAB — CBG MONITORING, ED: Glucose-Capillary: 141 mg/dL — ABNORMAL HIGH (ref 70–99)

## 2023-08-24 MED ORDER — PREDNISONE 20 MG PO TABS
40.0000 mg | ORAL_TABLET | Freq: Every day | ORAL | Status: DC
  Administered 2023-08-24: 40 mg via ORAL
  Filled 2023-08-24 (×2): qty 2

## 2023-08-24 MED ORDER — ENOXAPARIN SODIUM 30 MG/0.3ML IJ SOSY
30.0000 mg | PREFILLED_SYRINGE | INTRAMUSCULAR | Status: DC
  Administered 2023-08-24 – 2023-08-25 (×2): 30 mg via SUBCUTANEOUS
  Filled 2023-08-24 (×2): qty 0.3

## 2023-08-24 MED ORDER — AMLODIPINE BESYLATE 5 MG PO TABS
5.0000 mg | ORAL_TABLET | Freq: Every day | ORAL | Status: DC
  Administered 2023-08-25 – 2023-08-26 (×2): 5 mg via ORAL
  Filled 2023-08-24 (×3): qty 1

## 2023-08-24 MED ORDER — ATORVASTATIN CALCIUM 40 MG PO TABS
80.0000 mg | ORAL_TABLET | Freq: Every day | ORAL | Status: DC
  Administered 2023-08-24 – 2023-08-26 (×3): 80 mg via ORAL
  Filled 2023-08-24 (×4): qty 2

## 2023-08-24 MED ORDER — FLUTICASONE FUROATE-VILANTEROL 200-25 MCG/ACT IN AEPB
1.0000 | INHALATION_SPRAY | Freq: Every day | RESPIRATORY_TRACT | Status: DC
Start: 2023-08-24 — End: 2023-08-26
  Administered 2023-08-26: 1 via RESPIRATORY_TRACT
  Filled 2023-08-24: qty 28

## 2023-08-24 MED ORDER — PANTOPRAZOLE SODIUM 40 MG PO TBEC
40.0000 mg | DELAYED_RELEASE_TABLET | Freq: Every day | ORAL | Status: DC
  Administered 2023-08-24 – 2023-08-26 (×3): 40 mg via ORAL
  Filled 2023-08-24 (×4): qty 1

## 2023-08-24 MED ORDER — INSULIN ASPART 100 UNIT/ML IJ SOLN: 0.0000 [IU] | Freq: Every day | INTRAMUSCULAR | Status: DC

## 2023-08-24 MED ORDER — GUAIFENESIN ER 600 MG PO TB12
600.0000 mg | ORAL_TABLET | Freq: Two times a day (BID) | ORAL | Status: DC
  Administered 2023-08-24 – 2023-08-26 (×4): 600 mg via ORAL
  Filled 2023-08-24 (×4): qty 1

## 2023-08-24 MED ORDER — SODIUM CHLORIDE 0.9 % IV SOLN
500.0000 mg | Freq: Once | INTRAVENOUS | Status: AC
  Administered 2023-08-24: 500 mg via INTRAVENOUS
  Filled 2023-08-24: qty 5

## 2023-08-24 MED ORDER — OSELTAMIVIR PHOSPHATE 30 MG PO CAPS: 30.0000 mg | ORAL_CAPSULE | Freq: Every day | ORAL | Status: DC

## 2023-08-24 MED ORDER — ACETAMINOPHEN 500 MG PO TABS
1000.0000 mg | ORAL_TABLET | Freq: Once | ORAL | Status: AC
  Administered 2023-08-24: 1000 mg via ORAL
  Filled 2023-08-24: qty 2

## 2023-08-24 MED ORDER — ALBUTEROL SULFATE (2.5 MG/3ML) 0.083% IN NEBU: 2.5000 mg | INHALATION_SOLUTION | RESPIRATORY_TRACT | Status: DC | PRN

## 2023-08-24 MED ORDER — VANCOMYCIN HCL 2000 MG/400ML IV SOLN
2000.0000 mg | Freq: Once | INTRAVENOUS | Status: AC
  Administered 2023-08-24: 2000 mg via INTRAVENOUS
  Filled 2023-08-24: qty 400

## 2023-08-24 MED ORDER — SODIUM CHLORIDE 0.9 % IV SOLN
500.0000 mg | INTRAVENOUS | Status: DC
  Administered 2023-08-25 – 2023-08-26 (×2): 500 mg via INTRAVENOUS
  Filled 2023-08-24 (×2): qty 5

## 2023-08-24 MED ORDER — SODIUM CHLORIDE 0.9 % IV SOLN
2.0000 g | Freq: Once | INTRAVENOUS | Status: AC
  Administered 2023-08-24: 2 g via INTRAVENOUS
  Filled 2023-08-24: qty 12.5

## 2023-08-24 MED ORDER — LACTATED RINGERS IV BOLUS
500.0000 mL | Freq: Once | INTRAVENOUS | Status: AC
  Administered 2023-08-24: 500 mL via INTRAVENOUS

## 2023-08-24 MED ORDER — ONDANSETRON HCL 4 MG PO TABS: 4.0000 mg | ORAL_TABLET | Freq: Four times a day (QID) | ORAL | Status: DC | PRN

## 2023-08-24 MED ORDER — VANCOMYCIN HCL IN DEXTROSE 1-5 GM/200ML-% IV SOLN: 1000.0000 mg | Freq: Once | INTRAVENOUS | Status: DC

## 2023-08-24 MED ORDER — SODIUM CHLORIDE 0.9 % IV SOLN
500.0000 mg | INTRAVENOUS | Status: DC
  Filled 2023-08-24: qty 5

## 2023-08-24 MED ORDER — OSELTAMIVIR PHOSPHATE 30 MG PO CAPS
30.0000 mg | ORAL_CAPSULE | ORAL | Status: DC
  Administered 2023-08-24: 30 mg via ORAL
  Filled 2023-08-24: qty 1

## 2023-08-24 MED ORDER — INSULIN ASPART 100 UNIT/ML IJ SOLN
0.0000 [IU] | Freq: Three times a day (TID) | INTRAMUSCULAR | Status: DC
  Administered 2023-08-25: 4 [IU] via SUBCUTANEOUS
  Administered 2023-08-25 (×2): 3 [IU] via SUBCUTANEOUS
  Administered 2023-08-26 (×2): 4 [IU] via SUBCUTANEOUS
  Filled 2023-08-24: qty 1

## 2023-08-24 MED ORDER — SODIUM CHLORIDE 0.9 % IV SOLN
2.0000 g | Freq: Two times a day (BID) | INTRAVENOUS | Status: DC
  Administered 2023-08-24: 2 g via INTRAVENOUS
  Filled 2023-08-24: qty 12.5

## 2023-08-24 MED ORDER — LACTATED RINGERS IV SOLN: INTRAVENOUS | Status: AC

## 2023-08-24 MED ORDER — ARIPIPRAZOLE 10 MG PO TABS
10.0000 mg | ORAL_TABLET | Freq: Every day | ORAL | Status: DC
  Administered 2023-08-24 – 2023-08-25 (×2): 10 mg via ORAL
  Filled 2023-08-24: qty 2
  Filled 2023-08-24: qty 1

## 2023-08-24 MED ORDER — ONDANSETRON HCL 4 MG/2ML IJ SOLN: 4.0000 mg | Freq: Four times a day (QID) | INTRAMUSCULAR | Status: DC | PRN

## 2023-08-24 MED ORDER — ASPIRIN 81 MG PO TBEC
81.0000 mg | DELAYED_RELEASE_TABLET | Freq: Every day | ORAL | Status: DC
  Administered 2023-08-24 – 2023-08-26 (×3): 81 mg via ORAL
  Filled 2023-08-24 (×4): qty 1

## 2023-08-24 NOTE — ED Notes (Signed)
 This RN made pt aware that he needs to give a urine sample. Pt endorses that he is not able to give urine sample at this time and that he makes urine off and on, and would give a sample when he can.

## 2023-08-24 NOTE — Sepsis Progress Note (Signed)
 Elink will follow per sepsis protocol.

## 2023-08-24 NOTE — H&P (Signed)
 History and Physical    Patient: [name] [name] FMW:[id number] DOB: 01/27/1951 DOA: 08/24/2023 DOS: the patient was seen and examined on 08/24/2023 PCP: Carlette Benita Area, MD  Patient coming from: Home  Chief Complaint:  Chief Complaint  Patient presents with   Cough   HPI: [name] [name] is a 73 y.o. male with medical history significant of end-stage renal disease on dialysis Monday/Wednesday/Friday, diabetes type 2, GERD, hypertension, COPD.  Patient presents with shortness of breath over the last couple of weeks that has been progressive.  He has had a couple rounds of antibiotics but has not improved.  He has been coughing which is a dry wheezy cough.  No sputum production.  Denies fevers or chills currently.  Here, he is needed to be on 4 to 5 L nasal cannula.  Chest x-ray shows interstitial marking and he tested positive for flu and RSV.  Review of Systems: As mentioned in the history of present illness. All other systems reviewed and are negative. Past Medical History:  Diagnosis Date   Anemia    Arthritis    Asthma    Bell palsy    CAD (coronary artery disease)    a. 2014 MV: abnl w/ infap ischemia; b. 03/2013 Cath: aneurysmal bleb in the LAD w/ otw nonobs dzs-->Med Rx.   Chronic back pain    Chronic knee pain    a. 09/2015 s/p R TKA.   Chronic pain    Chronic shoulder pain    Chronic sinusitis    COPD (chronic obstructive pulmonary disease) (HCC)    Diabetes mellitus without complication (HCC)    type II    ESRD on peritoneal dialysis (HCC)    on peritoneal dialysis, [facility]   Essential hypertension    GERD (gastroesophageal reflux disease)    Gout    Hepatomegaly    noted on noncontrast CT 2015   History of hiatal hernia    Hyperlipidemia    Lateral meniscus tear    Obesity    Truncal   Obstructive sleep apnea    does not use cpap    On home oxygen  therapy    uses 2l when is going somewhere per patient    PUD (peptic ulcer disease)    remote,  reports f/u EGD about 8 years ago unremarkable    Reactive airway disease    related to exposure to chemical during 9/11   Sinusitis    Vitamin D  deficiency    Past Surgical History:  Procedure Laterality Date   A/V FISTULAGRAM N/A 02/28/2023   Procedure: A/V Fistulagram;  Surgeon: Lanis Fonda BRAVO, MD;  Location: [facility] INVASIVE CV LAB;  Service: Cardiovascular;  Laterality: N/A;   ASAD LT SHOULDER  12/15/2008   left shoulder   AV FISTULA PLACEMENT Left 08/09/2016   Procedure: BRACHIOCEPHALIC ARTERIOVENOUS (AV) FISTULA CREATION LEFT ARM;  Surgeon: Carlin BRAVO Haddock, MD;  Location: [facility];  Service: Vascular;  Laterality: Left;   CAPD INSERTION N/A 10/07/2018   Procedure: LAPAROSCOPIC PERITONEAL CATHETER PLACEMENT;  Surgeon: Stevie Herlene Righter, MD;  Location: [facility] ORS;  Service: General;  Laterality: N/A;   CATARACT EXTRACTION W/PHACO Left 03/28/2016   Procedure: CATARACT EXTRACTION PHACO AND INTRAOCULAR LENS PLACEMENT LEFT EYE;  Surgeon: Oneil Platts, MD;  Location: [facility] ORS;  Service: Ophthalmology;  Laterality: Left;  CDE: 4.77   CATARACT EXTRACTION W/PHACO Right 04/11/2016   Procedure: CATARACT EXTRACTION PHACO AND INTRAOCULAR LENS PLACEMENT RIGHT EYE; CDE:  4.74;  Surgeon: Oneil Platts, MD;  Location: [facility]S;  Service: Ophthalmology;  Laterality: Right;   COLONOSCOPY  10/15/2008   Fields: Rectal polyp obliterated, not retrieved, hemorrhoids, single ascending colon diverticulum near the CV. Next colonoscopy April 2020   COLONOSCOPY N/A 12/25/2014   SLF: 1. Colorectal polyps (2) removed 2. Small internal hemorrhoids 3. the left colon is severely redundant. hyperplastic polyps   CORONARY ARTERY BYPASS GRAFT N/A 06/05/2022   Procedure: OFF PUMP CORONARY ARTERY BYPASS GRAFTING (CABG) X 2 BYPASSES USING LEFT INTERNAL MAMMARY ARTERY AND RIGHT LEG GREATER SAPHENOUS VEIN HARVESTED ENDOSCOPICALLY;  Surgeon: Shyrl Linnie KIDD, MD;  Location: [facility] OR;  Service: Open Heart Surgery;  Laterality: N/A;    CORONARY STENT INTERVENTION N/A 07/25/2021   Procedure: CORONARY STENT INTERVENTION;  Surgeon: Wonda Sharper, MD;  Location: [facility] INVASIVE CV LAB;  Service: Cardiovascular;  Laterality: N/A;   CORONARY STENT INTERVENTION N/A 12/26/2021   Procedure: CORONARY STENT INTERVENTION;  Surgeon: Mady Bruckner, MD;  Location: [facility] INVASIVE CV LAB;  Service: Cardiovascular;  Laterality: N/A;   CORONARY STENT INTERVENTION N/A 01/20/2022   Procedure: CORONARY STENT INTERVENTION;  Surgeon: Wonda Sharper, MD;  Location: [facility] INVASIVE CV LAB;  Service: Cardiovascular;  Laterality: N/A;   DOPPLER ECHOCARDIOGRAPHY     ESOPHAGOGASTRODUODENOSCOPY N/A 12/25/2014   SLF: 1. Anemia most likely due to CRI, gastritis, gastric polyps 2. Moderate non-erosive gastriits and mild duodenitis.  3.TWo large gstric polyps removed.    EYE SURGERY  12/22/2010   tear duct probing-[facility]   FOREIGN BODY REMOVAL  03/29/2011   Procedure: REMOVAL FOREIGN BODY EXTREMITY;  Surgeon: Taft Minerva, MD;  Location: [facility];  Service: Orthopedics;  Laterality: Right;  Removal Foreign Body Right Thumb   IR FLUORO GUIDE CV LINE RIGHT  08/06/2018   IR FLUORO GUIDE CV LINE RIGHT  02/17/2023   IR US  GUIDE VASC ACCESS RIGHT  08/06/2018   IR US  GUIDE VASC ACCESS RIGHT  02/17/2023   KNEE ARTHROSCOPY  10/16/2007   left   KNEE ARTHROSCOPY WITH LATERAL MENISECTOMY Right 10/14/2015   Procedure: LEFT KNEE ARTHROSCOPY WITH PARTIAL LATERAL MENISECTOMY;  Surgeon: Taft FORBES Minerva, MD;  Location: [facility];  Service: Orthopedics;  Laterality: Right;   LEFT HEART CATH AND CORONARY ANGIOGRAPHY N/A 07/25/2021   Procedure: LEFT HEART CATH AND CORONARY ANGIOGRAPHY;  Surgeon: Wonda Sharper, MD;  Location: [facility] INVASIVE CV LAB;  Service: Cardiovascular;  Laterality: N/A;   LEFT HEART CATH AND CORONARY ANGIOGRAPHY N/A 12/26/2021   Procedure: LEFT HEART CATH AND CORONARY ANGIOGRAPHY;  Surgeon: Mady Bruckner, MD;  Location: [facility] INVASIVE CV LAB;  Service: Cardiovascular;   Laterality: N/A;   LEFT HEART CATH AND CORONARY ANGIOGRAPHY N/A 01/20/2022   Procedure: LEFT HEART CATH AND CORONARY ANGIOGRAPHY;  Surgeon: Wonda Sharper, MD;  Location: [facility] INVASIVE CV LAB;  Service: Cardiovascular;  Laterality: N/A;   LEFT HEART CATHETERIZATION WITH CORONARY ANGIOGRAM N/A 03/28/2013   Procedure: LEFT HEART CATHETERIZATION WITH CORONARY ANGIOGRAM;  Surgeon: Alm LELON Clay, MD;  Location: [facility] CATH LAB;  Service: Cardiovascular;  Laterality: N/A;   NM MYOVIEW  LTD     PENILE PROSTHESIS IMPLANT N/A 08/16/2015   Procedure: PENILE PROTHESIS INFLATABLE, three piece, Excisional biopsy of Penile ulcer, Penile molding;  Surgeon: Arlena Gal, MD;  Location: [facility]S;  Service: Urology;  Laterality: N/A;   PENILE PROSTHESIS IMPLANT N/A 12/24/2017   Procedure: REMOVAL AND  REPLACEMENT  COLOPLAST PENILE PROSTHESIS;  Surgeon: Carolee Sherwood JONETTA DOUGLAS, MD;  Location: [facility] ORS;  Service: Urology;  Laterality: N/A;   PERIPHERAL VASCULAR BALLOON  ANGIOPLASTY  02/28/2023   Procedure: PERIPHERAL VASCULAR BALLOON ANGIOPLASTY;  Surgeon: Lanis Fonda BRAVO, MD;  Location: [facility] INVASIVE CV LAB;  Service: Cardiovascular;;   PORT-A-CATH REMOVAL N/A 02/19/2023   Procedure: REMOVAL OF PERITONEAL DIALYSIS CATHETER;  Surgeon: Lanis Fonda BRAVO, MD;  Location: [facility] OR;  Service: Vascular;  Laterality: N/A;   QUADRICEPS TENDON REPAIR  07/21/2011   Procedure: REPAIR QUADRICEP TENDON;  Surgeon: Taft Minerva, MD;  Location: [facility];  Service: Orthopedics;  Laterality: Right;   RIGHT/LEFT HEART CATH AND CORONARY ANGIOGRAPHY N/A 05/30/2022   Procedure: RIGHT/LEFT HEART CATH AND CORONARY ANGIOGRAPHY;  Surgeon: Dann Candyce RAMAN, MD;  Location: [facility] INVASIVE CV LAB;  Service: Cardiovascular;  Laterality: N/A;   TEE WITHOUT CARDIOVERSION N/A 06/05/2022   Procedure: TRANSESOPHAGEAL ECHOCARDIOGRAM (TEE);  Surgeon: Shyrl Linnie KIDD, MD;  Location: [facility] OR;  Service: Open Heart Surgery;  Laterality: N/A;   TOENAIL EXCISION      removed x2-bilateral   UMBILICAL HERNIA REPAIR  07/17/2005   [place]   Social History:  reports that he quit smoking about 13 years ago. His smoking use included cigarettes. He started smoking about 38 years ago. He has a 25 pack-year smoking history. He has never used smokeless tobacco. He reports that he does not currently use alcohol . He reports current drug use. Drug: Marijuana.  Allergies  Allergen Reactions   Opana [Oxymorphone Hcl] Itching   Tramadol  Itching    Family History  Problem Relation Age of Onset   Hypertension Mother        MI   Cancer Mother        breast    Diabetes Mother    Diabetes Father    Hypertension Father    Hypertension Sister    Diabetes Sister    Arthritis Other    Asthma Other    Lung disease Other    Anesthesia problems Neg Hx    Hypotension Neg Hx    Malignant hyperthermia Neg Hx    Pseudochol deficiency Neg Hx    Colon cancer Neg Hx     Prior to Admission medications   Medication Sig Start Date End Date Taking? Authorizing Provider  albuterol  (VENTOLIN  HFA) 108 (90 Base) MCG/ACT inhaler Inhale 2 puffs into the lungs every 6 (six) hours as needed for wheezing or shortness of breath. 03/25/21   Jude Harden GAILS, MD  amLODipine  (NORVASC ) 5 MG tablet Take 1 tablet (5 mg total) by mouth daily. 05/07/23   Setzer, Sandra J, PA-C  amoxicillin  (AMOXIL ) 500 MG tablet Take 500 mg by mouth every 8 (eight) hours. 08/09/23   [provider]  ARIPiprazole  (ABILIFY ) 10 MG tablet Take 10 mg by mouth at bedtime.    [provider]  ascorbic acid  (VITAMIN C ) 500 MG tablet Take 1 tablet (500 mg total) by mouth daily. 05/07/23   Setzer, Nena PARAS, PA-C  aspirin  EC 81 MG tablet Take 81 mg by mouth daily. Swallow whole.    [provider]  atorvastatin  (LIPITOR ) 80 MG tablet Take 1 tablet (80 mg total) by mouth daily. Patient not taking: Reported on 06/28/2023 06/20/22   Dwan Kyla HERO, PA-C  azithromycin  (ZITHROMAX ) 250 MG tablet  Take 250 mg by mouth. 08/17/23   [provider]  Darbepoetin Alfa  (ARANESP ) 150 MCG/0.3ML SOSY injection Inject 0.3 mLs (150 mcg total) into the skin every Sunday at 6pm. Patient not taking: Reported on 06/28/2023 05/13/23   Setzer, Sandra J, PA-C  Darbepoetin Alfa -Albumin  (ARANESP  IJ) Inject as directed. Done  at HD    [provider]  divalproex  (DEPAKOTE ) 250 MG DR tablet  05/24/23   [provider]  esomeprazole  (NEXIUM ) 40 MG capsule Take 1 capsule (40 mg total) by mouth 2 (two) times daily before a meal. 07/16/23   Shirlean Therisa ORN, NP  fluticasone  furoate-vilanterol (BREO ELLIPTA ) 200-25 MCG/ACT AEPB Use 1 (one) inhaltion once a day. 05/07/23   Setzer, Sandra J, PA-C  hydrocortisone  (ANUSOL -HC) 2.5 % rectal cream Place 1 Application rectally 2 (two) times daily. Patient taking differently: Place 1 Application rectally as needed. 05/16/23   Shirlean Therisa ORN, NP  loratadine  (CLARITIN ) 10 MG tablet Take 10 mg by mouth daily. 06/16/23   [provider]  Nystatin (GERHARDT'S BUTT CREAM) CREA Apply 1 Application topically 4 (four) times daily. 03/02/23   Ghimire, Donalda HERO, MD  promethazine -dextromethorphan  (PROMETHAZINE -DM) 6.25-15 MG/5ML syrup SMARTSIG:1 teaspoon By Mouth Every 6 Hours PRN 08/09/23   [provider]    Physical Exam: Vitals:   08/24/23 1532 08/24/23 1623 08/24/23 1624 08/24/23 1645  BP:  109/67  109/74  Pulse:    72  Resp:  (!) 37  (!) 36  Temp:   98.5 F (36.9 C)   TempSrc:   Oral   SpO2: 96%   99%  Weight:      Height:       General: Elderly male. Awake and alert and oriented x3. No acute cardiopulmonary distress.  HEENT: Normocephalic atraumatic.  Right and left ears normal in appearance.  Pupils equal, round, reactive to light. Extraocular muscles are intact. Sclerae anicteric and noninjected.  Moist mucosal membranes. No mucosal lesions.  Neck: Neck supple without lymphadenopathy. No carotid bruits. No masses palpated.   Cardiovascular: Regular rate with normal S1-S2 sounds. No murmurs, rubs, gallops auscultated. No JVD.  Respiratory: Inspiratory expiratory Rales diffusely.  Moderate wheezing on expiration.  No accessory muscle use. Abdomen: Soft, nontender, nondistended. Active bowel sounds. No masses or hepatosplenomegaly  Skin: No rashes, lesions, or ulcerations.  Dry, warm to touch. 2+ dorsalis pedis and radial pulses. Musculoskeletal: No calf or leg pain. All major joints not erythematous nontender.  No upper or lower joint deformation.  Good ROM.  No contractures  Psychiatric: Intact judgment and insight. Pleasant and cooperative. Neurologic: No focal neurological deficits. Strength is 5/5 and symmetric in upper and lower extremities.  Cranial nerves II through XII are grossly intact.  Data Reviewed: Results for orders placed or performed during the hospital encounter of 08/24/23 (from the past 24 hours)  Resp panel by RT-PCR (RSV, Flu A&B, Covid) Anterior Nasal Swab     Status: Abnormal   Collection Time: 08/24/23  2:50 PM   Specimen: Anterior Nasal Swab  Result Value Ref Range   SARS Coronavirus 2 by RT PCR NEGATIVE NEGATIVE   Influenza A by PCR POSITIVE (A) NEGATIVE   Influenza B by PCR NEGATIVE NEGATIVE   Resp Syncytial Virus by PCR POSITIVE (A) NEGATIVE  Comprehensive metabolic panel     Status: Abnormal   Collection Time: 08/24/23  3:33 PM  Result Value Ref Range   Sodium 137 135 - 145 mmol/L   Potassium 3.7 3.5 - 5.1 mmol/L   Chloride 96 (L) 98 - 111 mmol/L   CO2 28 22 - 32 mmol/L   Glucose, Bld 121 (H) 70 - 99 mg/dL   BUN 15 8 - 23 mg/dL   Creatinine, Ser 5.17 (H) 0.61 - 1.24 mg/dL   Calcium  8.5 (L) 8.9 - 10.3 mg/dL  Total Protein 7.9 6.5 - 8.1 g/dL   Albumin  3.1 (L) 3.5 - 5.0 g/dL   AST 28 15 - 41 U/L   ALT 22 0 - 44 U/L   Alkaline Phosphatase 70 38 - 126 U/L   Total Bilirubin 0.9 0.0 - 1.2 mg/dL   GFR, Estimated 12 (L) >60 mL/min   Anion gap 13 5 - 15  Lactic acid, plasma      Status: None   Collection Time: 08/24/23  3:33 PM  Result Value Ref Range   Lactic Acid, Venous 1.4 0.5 - 1.9 mmol/L  CBC with Differential     Status: Abnormal   Collection Time: 08/24/23  3:33 PM  Result Value Ref Range   WBC 5.2 4.0 - 10.5 K/uL   RBC 3.16 (L) 4.22 - 5.81 MIL/uL   Hemoglobin 8.4 (L) 13.0 - 17.0 g/dL   HCT 73.3 (L) 60.9 - 47.9 %   MCV 84.2 80.0 - 100.0 fL   MCH 26.6 26.0 - 34.0 pg   MCHC 31.6 30.0 - 36.0 g/dL   RDW 82.8 (H) 88.4 - 84.4 %   Platelets 216 150 - 400 K/uL   nRBC 0.0 0.0 - 0.2 %   Neutrophils Relative % 77 %   Neutro Abs 4.0 1.7 - 7.7 K/uL   Lymphocytes Relative 7 %   Lymphs Abs 0.4 (L) 0.7 - 4.0 K/uL   Monocytes Relative 11 %   Monocytes Absolute 0.6 0.1 - 1.0 K/uL   Eosinophils Relative 3 %   Eosinophils Absolute 0.2 0.0 - 0.5 K/uL   Basophils Relative 1 %   Basophils Absolute 0.1 0.0 - 0.1 K/uL   Immature Granulocytes 1 %   Abs Immature Granulocytes 0.03 0.00 - 0.07 K/uL  Protime-INR     Status: None   Collection Time: 08/24/23  3:33 PM  Result Value Ref Range   Prothrombin Time 15.1 11.4 - 15.2 seconds   INR 1.2 0.8 - 1.2  Procalcitonin     Status: None   Collection Time: 08/24/23  3:33 PM  Result Value Ref Range   Procalcitonin 1.15 ng/mL  APTT     Status: None   Collection Time: 08/24/23  3:40 PM  Result Value Ref Range   aPTT 28 24 - 36 seconds  Lactic acid, plasma     Status: Abnormal   Collection Time: 08/24/23  5:23 PM  Result Value Ref Range   Lactic Acid, Venous 2.3 (HH) 0.5 - 1.9 mmol/L   *Note: Due to a large number of results and/or encounters for the requested time period, some results have not been displayed. A complete set of results can be found in Results Review.    DG Chest Port 1 View Result Date: 08/24/2023 CLINICAL DATA:  Sepsis EXAM: PORTABLE CHEST 1 VIEW COMPARISON:  Chest x-ray 08/14/2023 FINDINGS: The heart is enlarged. There are increased central interstitial markings bilaterally. There is no focal lung  infiltrate, pleural effusion or pneumothorax. No acute fractures are seen. IMPRESSION: Cardiomegaly with increased central interstitial markings bilaterally, which may represent pulmonary edema. Electronically Signed   By: Greig Pique M.D.   On: 08/24/2023 16:56     Assessment and Plan: No notes have been filed under this hospital service. Service: Hospitalist  Active Problems:   Type 2 diabetes mellitus with chronic kidney disease on chronic dialysis, with long-term current use of insulin  (HCC)   Primary hypertension   OSA on CPAP   CAD (coronary artery disease)   NSTEMI (non-ST  elevated myocardial infarction) (HCC)   End stage renal disease on dialysis (HCC)   HFrEF (heart failure with reduced ejection fraction) (HCC)   PAF (paroxysmal atrial fibrillation) (HCC)   Long term (current) use of insulin  (HCC)   Acute respiratory failure with hypoxia (HCC)  Acute respiratory failure with hypoxia RSV, flu bronchiolitis with superimposed community-acquired pneumonia and COPD We will treat with Tamiflu , antibiotics Start steroids due to COPD Continue long-acting inhalers, S ABA Repeat lactic acid in 2 hours End-stage renal disease on dialysis Had dialysis today Stable from a fluid standpoint Coronary artery disease with a history of NSTEMI Continue statin and aspirin  Obstructive sleep apnea on CPAP Diabetes with insulin  control Sliding scale insulin  Home insulin  regimen   Advance Care Planning:   Code Status: Full Code confirmed by patient  Consults: None  Family Communication: None  Severity of Illness: The appropriate patient status for this patient is INPATIENT. Inpatient status is judged to be reasonable and necessary in order to provide the required intensity of service to ensure the patient's safety. The patient's presenting symptoms, physical exam findings, and initial radiographic and laboratory data in the context of their chronic comorbidities is felt to place them at  high risk for further clinical deterioration. Furthermore, it is not anticipated that the patient will be medically stable for discharge from the hospital within 2 midnights of admission.   * I certify that at the point of admission it is my clinical judgment that the patient will require inpatient hospital care spanning beyond 2 midnights from the point of admission due to high intensity of service, high risk for further deterioration and high frequency of surveillance required.*  Author: Maudine Kluesner J Demeshia Sherburne, DO 08/24/2023 7:51 PM  For on call review [email or web address].

## 2023-08-24 NOTE — Progress Notes (Signed)
Notified bedside nurse of need to draw 3rd lactic acid.

## 2023-08-24 NOTE — ED Triage Notes (Signed)
 Pt with cough and chest congestion x 2 weeks.pt has been on two different antibiotics.

## 2023-08-24 NOTE — ED Provider Notes (Signed)
 [place] [facility]ERGENC[facility] DEPARTMENT AT [facility] Provider Note   CSN: [id number] Arrival date & time: 08/24/23  1434     History  Chief Complaint  Patient presents with   Cough    [name] [name] is a 73 y.o. male.   Cough Patient shortness of breath and cough.  Has had for around 2 weeks.  Has been on 2 different antibiotics.  Now more short of breath more fatigue.  Arrives with fever.  Occasional sputum production.  Is a dialysis patient was dialyzed today.    Past Medical History:  Diagnosis Date   Anemia    Arthritis    Asthma    Bell palsy    CAD (coronary artery disease)    a. 2014 MV: abnl w/ infap ischemia; b. 03/2013 Cath: aneurysmal bleb in the LAD w/ otw nonobs dzs-->Med Rx.   Chronic back pain    Chronic knee pain    a. 09/2015 s/p R TKA.   Chronic pain    Chronic shoulder pain    Chronic sinusitis    COPD (chronic obstructive pulmonary disease) (HCC)    Diabetes mellitus without complication (HCC)    type II    ESRD on peritoneal dialysis (HCC)    on peritoneal dialysis, [facility]   Essential hypertension    GERD (gastroesophageal reflux disease)    Gout    Hepatomegaly    noted on noncontrast CT 2015   History of hiatal hernia    Hyperlipidemia    Lateral meniscus tear    Obesity    Truncal   Obstructive sleep apnea    does not use cpap    On home oxygen  therapy    uses 2l when is going somewhere per patient    PUD (peptic ulcer disease)    remote, reports f/u EGD about 8 years ago unremarkable    Reactive airway disease    related to exposure to chemical during 9/11   Sinusitis    Vitamin D  deficiency     Home Medications Prior to Admission medications   Medication Sig Start Date End Date Taking? Authorizing Provider  albuterol  (VENTOLIN  HFA) 108 (90 Base) MCG/ACT inhaler Inhale 2 puffs into the lungs every 6 (six) hours as needed for wheezing or shortness of breath. 03/25/21   Jude Harden GAILS, MD  amLODipine  (NORVASC ) 5 MG  tablet Take 1 tablet (5 mg total) by mouth daily. 05/07/23   Setzer, Nena PARAS, PA-C  ARIPiprazole  (ABILIFY ) 10 MG tablet Take 10 mg by mouth at bedtime.    [provider]  ascorbic acid  (VITAMIN C ) 500 MG tablet Take 1 tablet (500 mg total) by mouth daily. 05/07/23   Setzer, Nena PARAS, PA-C  aspirin  EC 81 MG tablet Take 81 mg by mouth daily. Swallow whole.    [provider]  atorvastatin  (LIPITOR ) 80 MG tablet Take 1 tablet (80 mg total) by mouth daily. Patient not taking: Reported on 06/28/2023 06/20/22   Dwan Kyla HERO, PA-C  Darbepoetin Alfa  (ARANESP ) 150 MCG/0.3ML SOSY injection Inject 0.3 mLs (150 mcg total) into the skin every Sunday at 6pm. Patient not taking: Reported on 06/28/2023 05/13/23   Setzer, Sandra J, PA-C  Darbepoetin Alfa -Albumin  (ARANESP  IJ) Inject as directed. Done at HD    [provider]  esomeprazole  (NEXIUM ) 40 MG capsule Take 1 capsule (40 mg total) by mouth 2 (two) times daily before a meal. 07/16/23   Shirlean Therisa LELON, NP  fluticasone  furoate-vilanterol (BREO ELLIPTA ) 200-25  MCG/ACT AEPB Use 1 (one) inhaltion once a day. 05/07/23   Setzer, Sandra J, PA-C  hydrocortisone  (ANUSOL -HC) 2.5 % rectal cream Place 1 Application rectally 2 (two) times daily. Patient taking differently: Place 1 Application rectally as needed. 05/16/23   Shirlean Therisa ORN, NP  Nystatin (GERHARDT'S BUTT CREAM) CREA Apply 1 Application topically 4 (four) times daily. 03/02/23   Ghimire, Donalda HERO, MD      Allergies    Opana [oxymorphone hcl] and Tramadol     Review of Systems   Review of Systems  Respiratory:  Positive for cough.     Physical Exam Updated Vital Signs BP 109/74   Pulse 72   Temp 98.5 F (36.9 C) (Oral)   Resp (!) 36   Ht 5' 4 (1.626 m)   Wt 100.7 kg   SpO2 99%   BMI 38.11 kg/m  Physical Exam Vitals and nursing note reviewed.  HENT:     Head: Normocephalic.  Eyes:     Pupils: Pupils are equal, round, and reactive to light.  Pulmonary:      Comments: Diffuse harsh breath sounds. Abdominal:     Tenderness: There is no abdominal tenderness.  Skin:    General: Skin is warm.  Neurological:     Mental Status: He is alert.     ED Results / Procedures / Treatments   Labs (all labs ordered are listed, but only abnormal results are displayed) Labs Reviewed  RESP PANEL BY RT-PCR (RSV, FLU A&B, COVID)  RVPGX2 - Abnormal; Notable for the following components:      Result Value   Influenza A by PCR POSITIVE (*)    Resp Syncytial Virus by PCR POSITIVE (*)    All other components within normal limits  COMPREHENSIVE METABOLIC PANEL - Abnormal; Notable for the following components:   Chloride 96 (*)    Glucose, Bld 121 (*)    Creatinine, Ser 4.82 (*)    Calcium  8.5 (*)    Albumin  3.1 (*)    GFR, Estimated 12 (*)    All other components within normal limits  CBC WITH DIFFERENTIAL/PLATELET - Abnormal; Notable for the following components:   RBC 3.16 (*)    Hemoglobin 8.4 (*)    HCT 26.6 (*)    RDW 17.1 (*)    Lymphs Abs 0.4 (*)    All other components within normal limits  CULTURE, BLOOD (ROUTINE X 2)  CULTURE, BLOOD (ROUTINE X 2)  MRSA NEXT GEN BY PCR, NASAL  LACTIC ACID, PLASMA  PROTIME-INR  APTT  LACTIC ACID, PLASMA  URINALYSIS, W/ REFLEX TO CULTURE (INFECTION SUSPECTED)    EKG None  Radiology DG Chest Port 1 View Result Date: 08/24/2023 CLINICAL DATA:  Sepsis EXAM: PORTABLE CHEST 1 VIEW COMPARISON:  Chest x-ray 08/14/2023 FINDINGS: The heart is enlarged. There are increased central interstitial markings bilaterally. There is no focal lung infiltrate, pleural effusion or pneumothorax. No acute fractures are seen. IMPRESSION: Cardiomegaly with increased central interstitial markings bilaterally, which may represent pulmonary edema. Electronically Signed   By: Greig Pique M.D.   On: 08/24/2023 16:56    Procedures Procedures    Medications Ordered in ED Medications  lactated ringers  infusion ( Intravenous  New Bag/Given 08/24/23 1555)  vancomycin  (VANCOREADY) IVPB 2000 mg/400 mL (2,000 mg Intravenous New Bag/Given 08/24/23 1556)  ceFEPIme  (MAXIPIME ) 2 g in sodium chloride  0.9 % 100 mL IVPB (0 g Intravenous Stopped 08/24/23 1659)  azithromycin  (ZITHROMAX ) 500 mg in sodium chloride  0.9 % 250 mL IVPB (  500 mg Intravenous New Bag/Given 08/24/23 1604)  acetaminophen  (TYLENOL ) tablet 1,000 mg (1,000 mg Oral Given 08/24/23 1618)    ED Course/ Medical Decision Making/ A&P                                 Medical Decision Making Amount and/or Complexity of Data Reviewed Labs: ordered. Radiology: ordered.  Risk OTC drugs. Prescription drug management.   Patient shortness of breath and cough.  Arrives hypoxic.  Not normally on oxygen  per patient but appears that he does have oxygen  that is used previously.  Differential diagnose includes pneumonia.  With the fever and having been on at least 2 different unknown antibiotics with respiratory symptoms antibiotics were started.  Started initially with MRSA and pseudomonal coverage.  Oxygenation improved on oxygen .  X-ray does show potential pulmonary edema.  Patient is Influenza and RSV positive.  With shortness of breath and hypoxia will admit to hospitalist.  CRITICAL CARE Performed by: [place] Total critical care time: 30 minutes Critical care time was exclusive of separately billable procedures and treating other patients. Critical care was necessary to treat or prevent imminent or life-threatening deterioration. Critical care was time spent personally by me on the following activities: development of treatment plan with patient and/or surrogate as well as nursing, discussions with consultants, evaluation of patient's response to treatment, examination of patient, obtaining history from patient or surrogate, ordering and performing treatments and interventions, ordering and review of laboratory studies, ordering and review of radiographic studies,  pulse oximetry and re-evaluation of patient's condition.         Final Clinical Impression(s) / ED Diagnoses Final diagnoses:  End stage renal disease on dialysis (HCC)  Influenza  RSV (respiratory syncytial virus infection)  Hypoxia    Rx / DC Orders ED Discharge Orders     None         River Rankin, MD 08/24/23 1709

## 2023-08-24 NOTE — ED Notes (Addendum)
 Pt's RA sats in triage 83-88%, pt placed on [address] [place] at 2 L/M.

## 2023-08-25 DIAGNOSIS — I214 Non-ST elevation (NSTEMI) myocardial infarction: Secondary | ICD-10-CM | POA: Diagnosis not present

## 2023-08-25 DIAGNOSIS — J9601 Acute respiratory failure with hypoxia: Secondary | ICD-10-CM | POA: Diagnosis not present

## 2023-08-25 DIAGNOSIS — I5020 Unspecified systolic (congestive) heart failure: Secondary | ICD-10-CM | POA: Diagnosis not present

## 2023-08-25 DIAGNOSIS — N186 End stage renal disease: Secondary | ICD-10-CM

## 2023-08-25 DIAGNOSIS — Z992 Dependence on renal dialysis: Secondary | ICD-10-CM

## 2023-08-25 LAB — BASIC METABOLIC PANEL
Anion gap: 14 (ref 5–15)
BUN: 24 mg/dL — ABNORMAL HIGH (ref 8–23)
CO2: 25 mmol/L (ref 22–32)
Calcium: 8.5 mg/dL — ABNORMAL LOW (ref 8.9–10.3)
Chloride: 98 mmol/L (ref 98–111)
Creatinine, Ser: 6.03 mg/dL — ABNORMAL HIGH (ref 0.61–1.24)
GFR, Estimated: 9 mL/min — ABNORMAL LOW (ref >60)
Glucose, Bld: 152 mg/dL — ABNORMAL HIGH (ref 70–99)
Potassium: 4.8 mmol/L (ref 3.5–5.1)
Sodium: 137 mmol/L (ref 135–145)

## 2023-08-25 LAB — CBC
HCT: 27.1 % — ABNORMAL LOW (ref 39.0–52.0)
Hemoglobin: 8 g/dL — ABNORMAL LOW (ref 13.0–17.0)
MCH: 25.3 pg — ABNORMAL LOW (ref 26.0–34.0)
MCHC: 29.5 g/dL — ABNORMAL LOW (ref 30.0–36.0)
MCV: 85.8 fL (ref 80.0–100.0)
Platelets: 171 10*3/uL (ref 150–400)
RBC: 3.16 MIL/uL — ABNORMAL LOW (ref 4.22–5.81)
RDW: 17 % — ABNORMAL HIGH (ref 11.5–15.5)
WBC: 4 10*3/uL (ref 4.0–10.5)
nRBC: 0 % (ref 0.0–0.2)

## 2023-08-25 LAB — GLUCOSE, CAPILLARY
Glucose-Capillary: 133 mg/dL — ABNORMAL HIGH (ref 70–99)
Glucose-Capillary: 143 mg/dL — ABNORMAL HIGH (ref 70–99)
Glucose-Capillary: 118 mg/dL — ABNORMAL HIGH (ref 70–99)

## 2023-08-25 LAB — CBG MONITORING, ED: Glucose-Capillary: 152 mg/dL — ABNORMAL HIGH (ref 70–99)

## 2023-08-25 MED ORDER — ALBUTEROL SULFATE (2.5 MG/3ML) 0.083% IN NEBU: 2.5000 mg | INHALATION_SOLUTION | RESPIRATORY_TRACT | Status: DC | PRN

## 2023-08-25 MED ORDER — OSELTAMIVIR PHOSPHATE 30 MG PO CAPS: 30.0000 mg | ORAL_CAPSULE | ORAL | Status: DC

## 2023-08-25 MED ORDER — SODIUM CHLORIDE 0.9 % IV SOLN: 2.0000 g | INTRAVENOUS | Status: DC

## 2023-08-25 MED ORDER — METHYLPREDNISOLONE SODIUM SUCC 40 MG IJ SOLR
40.0000 mg | Freq: Two times a day (BID) | INTRAMUSCULAR | Status: DC
  Administered 2023-08-25 – 2023-08-26 (×2): 40 mg via INTRAVENOUS
  Filled 2023-08-25 (×2): qty 1

## 2023-08-25 MED ORDER — LABETALOL HCL 5 MG/ML IV SOLN: 10.0000 mg | INTRAVENOUS | Status: DC | PRN

## 2023-08-25 NOTE — Plan of Care (Signed)
  Problem: Education: Goal: Ability to describe self-care measures that may prevent or decrease complications (Diabetes Survival Skills Education) will improve Outcome: Progressing   Problem: Coping: Goal: Ability to adjust to condition or change in health will improve Outcome: Progressing   Problem: Health Behavior/Discharge Planning: Goal: Ability to identify and utilize available resources and services will improve Outcome: Progressing

## 2023-08-25 NOTE — TOC Progression Note (Addendum)
 Transition of Care [facility]) - Progression Note    Patient Details  Name: [name] [name] MRN: [id number] Date of Birth: 03-15-1951  Transition of Care [facility]) CM/SW Contact  Lorraine LILLETTE [place], [place] Phone Number: 08/25/2023, 1:21 PM  Clinical Narrative:    Update from RN team via [facility]- CSW was looking at a different patient-disregard. Update.    Barriers to Discharge: Continued Medical Work up  Expected Discharge Plan and Services                                               Social Determinants of Health (SDOH) Interventions SDOH Screenings   Food Insecurity: No Food Insecurity (08/25/2023)  Housing: Low Risk  (08/25/2023)  Transportation Needs: No Transportation Needs (08/25/2023)  Utilities: Not At Risk (08/25/2023)  Alcohol  Screen: Low Risk  (12/29/2021)  Depression (PHQ2-9): Medium Risk (05/28/2023)  Financial Resource Strain: Low Risk  (03/05/2023)   Received from [facility]  Physical Activity: Sufficiently Active (12/29/2021)  Social Connections: Socially Integrated (08/25/2023)  Stress: Patient Declined (03/12/2023)   Received from [facility]  Tobacco Use: Medium Risk (08/24/2023)    Readmission Risk Interventions    06/19/2022    2:09 PM 06/01/2022   11:44 AM 01/20/2022    3:18 PM  Readmission Risk Prevention Plan  Transportation Screening Complete Complete Complete  PCP or Specialist Appt within 3-5 Days   Complete  HRI or Home Care Consult  Complete Complete  Social Work Consult for Recovery Care Planning/Counseling  Complete Complete  Palliative Care Screening  Not Applicable Not Applicable  Medication Review [facility]) Complete Referral to Pharmacy Complete  PCP or Specialist appointment within 3-5 days of discharge Complete    HRI or Home Care Consult Complete    SW Recovery Care/Counseling Consult Complete    Palliative Care Screening Not Applicable    Skilled Nursing Facility Not Applicable

## 2023-08-25 NOTE — Progress Notes (Signed)
   08/25/23 1205  TOC Brief Assessment  Insurance and Status Reviewed  Patient has primary care physician Yes  Home environment has been reviewed From home, spouse  Prior level of function: Independent  Prior/Current Home Services Current home services (Dialysis)  Readmission risk has been reviewed Yes  Transition of care needs no transition of care needs at this time       Transition of Care Department [facility]) has reviewed patient and no TOC needs have been identified at this time. We will continue to monitor patient advancement through interdisciplinary progression rounds. If new patient transition needs arise, please place a TOC consult.

## 2023-08-25 NOTE — Progress Notes (Signed)
 PROGRESS NOTE  [name] [name], is a 73 y.o. male, DOB - [birth year], FMW:[id number]  Admit date - 08/24/2023   Admitting Physician Lang JINNY Peel, DO  Outpatient Primary MD for the patient is [name], Benita Area, MD  LOS - 1  Chief Complaint  Patient presents with   Cough      Brief Narrative:  73 y.o. male with medical history significant of end-stage renal disease on dialysis Monday/Wednesday/Friday, diabetes type 2, GERD, hypertension, COPD admitted on 08/24/2023 with acute hypoxic respiratory failure secondary to RSV and influenza    -Assessment and Plan: 1)Influenza A and RSV bronchiolitis--- -continue Tamiflu , bronchiodilators and mucolytics -Supportive care   2)HTN--continue amlodipine  5 mg daily  3)COPD exacerbation--due to #1 above -IV Solu-Medrol  as ordered -antiBiotics as ordered  4)GERD--- continue Protonix  especially while on steroids  5)ESRD--MWF schedule Last HD 08/24/23 per usual schedule  6) class II obesity and OSA--continue CPAP nightly -Low calorie diet, portion control and increase physical activity discussed with patient -Body mass index is 38.11 kg/m.  7)CAD h/o prior NSTEMI--chest pain-free continue atorvastatin  and aspirin   8)DM2-hemoglobin A1c was 4.9 reflecting excellent diabetic control PTA Use Novolog /Humalog Sliding scale insulin  with Accu-Cheks/Fingersticks as ordered   Status is: Inpatient   Disposition: The patient is from: Home              Anticipated d/c is to: Home              Anticipated d/c date is: 2 days              Patient currently is not medically stable to d/c. Barriers: Not Clinically Stable-   Code Status :  -  Code Status: Full Code   Family Communication:    NA (patient is alert, awake and coherent)   DVT Prophylaxis  :   - SCDs   enoxaparin  (LOVENOX ) injection 30 mg Start: 08/24/23 2000   Lab Results  Component Value Date   PLT 171 08/25/2023    Inpatient Medications  Scheduled Meds:  amLODipine   5 mg  Oral Daily   ARIPiprazole   10 mg Oral QHS   aspirin  EC  81 mg Oral Daily   atorvastatin   80 mg Oral Daily   enoxaparin  (LOVENOX ) injection  30 mg Subcutaneous Q24H   fluticasone  furoate-vilanterol  1 puff Inhalation Daily   guaiFENesin   600 mg Oral BID   insulin  aspart  0-20 Units Subcutaneous TID WC   insulin  aspart  0-5 Units Subcutaneous QHS   [START ON 08/27/2023] oseltamivir   30 mg Oral Q M,W,F-HD   pantoprazole   40 mg Oral Daily   predniSONE   40 mg Oral Q breakfast   Continuous Infusions:  azithromycin  500 mg (08/25/23 1752)   [START ON 08/27/2023] ceFEPime  (MAXIPIME ) IV     PRN Meds:.albuterol , ondansetron  **OR** ondansetron  (ZOFRAN ) IV   Anti-infectives (From admission, onward)    Start     Dose/Rate Route Frequency Ordered Stop   08/27/23 1800  oseltamivir  (TAMIFLU ) capsule 30 mg        30 mg Oral Every M-W-F (Hemodialysis) 08/25/23 1426 08/31/23 1759   08/27/23 1600  ceFEPIme  (MAXIPIME ) 2 g in sodium chloride  0.9 % 100 mL IVPB        2 g 200 mL/hr over 30 Minutes Intravenous Every M-W-F (Hemodialysis) 08/25/23 0738     08/25/23 1700  azithromycin  (ZITHROMAX ) 500 mg in sodium chloride  0.9 % 250 mL IVPB        500 mg 250 mL/hr over 60 Minutes  Intravenous Every 24 hours 08/24/23 2120 08/30/23 1659   08/24/23 2000  azithromycin  (ZITHROMAX ) 500 mg in sodium chloride  0.9 % 250 mL IVPB  Status:  Discontinued        500 mg 250 mL/hr over 60 Minutes Intravenous Every 24 hours 08/24/23 1955 08/24/23 2120   08/24/23 2000  ceFEPIme  (MAXIPIME ) 2 g in sodium chloride  0.9 % 100 mL IVPB  Status:  Discontinued        2 g 200 mL/hr over 30 Minutes Intravenous Every 12 hours 08/24/23 1955 08/25/23 0738   08/24/23 1730  oseltamivir  (TAMIFLU ) capsule 30 mg  Status:  Discontinued        30 mg Oral Daily 08/24/23 1719 08/24/23 1720   08/24/23 1730  oseltamivir  (TAMIFLU ) capsule 30 mg  Status:  Discontinued        30 mg Oral Every other day 08/24/23 1721 08/25/23 1426   08/24/23 1545   vancomycin  (VANCOCIN ) IVPB 1000 mg/200 mL premix  Status:  Discontinued        1,000 mg 200 mL/hr over 60 Minutes Intravenous  Once 08/24/23 1541 08/24/23 1543   08/24/23 1545  ceFEPIme  (MAXIPIME ) 2 g in sodium chloride  0.9 % 100 mL IVPB        2 g 200 mL/hr over 30 Minutes Intravenous  Once 08/24/23 1541 08/24/23 1659   08/24/23 1545  azithromycin  (ZITHROMAX ) 500 mg in sodium chloride  0.9 % 250 mL IVPB        500 mg 250 mL/hr over 60 Minutes Intravenous  Once 08/24/23 1541 08/24/23 1750   08/24/23 1545  vancomycin  (VANCOREADY) IVPB 2000 mg/400 mL        2,000 mg 200 mL/hr over 120 Minutes Intravenous  Once 08/24/23 1543 08/24/23 1801         Subjective: [name] [name] today has no fevers, no emesis,  No chest pain,   - Cough dyspnea and hypoxia persist   Objective: Vitals:   08/25/23 1030 08/25/23 1100 08/25/23 1200 08/25/23 1253  BP: 122/70 (!) 141/68 (!) 120/45 115/76  Pulse: (!) 54 (!) 59 63 83  Resp: (!) 21 (!) 21 (!) 23 20  Temp:    97.8 F (36.6 C)  TempSrc:    Oral  SpO2: 96% 98% 94% 96%  Weight:      Height:       No intake or output data in the 24 hours ending 08/25/23 1809 Filed Weights   08/24/23 1448  Weight: 100.7 kg    Physical Exam  Gen:- Awake Alert, no acute distress HEENT:- [place].AT, No sclera icterus Nose- [place] 2L/min Neck-Supple Neck,No JVD,.  Lungs-diminished breath sounds, no wheezing CV- S1, S2 normal, regular  Abd-  +ve B.Sounds, Abd Soft, No tenderness,    Extremity/Skin:- No  edema, pedal pulses present  Psych-affect is appropriate, oriented x3 Neuro-no new focal deficits, no tremors MSK-left upper extremity fistula with bruit and thrill  Data Reviewed: I have personally reviewed following labs and imaging studies  CBC: Recent Labs  Lab 08/24/23 1533 08/25/23 0331  WBC 5.2 4.0  NEUTROABS 4.0  --   HGB 8.4* 8.0*  HCT 26.6* 27.1*  MCV 84.2 85.8  PLT 216 171   Basic Metabolic Panel: Recent Labs  Lab 08/24/23 1533 08/25/23 0331   NA 137 137  K 3.7 4.8  CL 96* 98  CO2 28 25  GLUCOSE 121* 152*  BUN 15 24*  CREATININE 4.82* 6.03*  CALCIUM  8.5* 8.5*   GFR: Estimated Creatinine Clearance: 11.7 mL/min (  A) (by C-G formula based on SCr of 6.03 mg/dL (H)). Liver Function Tests: Recent Labs  Lab 08/24/23 1533  AST 28  ALT 22  ALKPHOS 70  BILITOT 0.9  PROT 7.9  ALBUMIN  3.1*   Recent Results (from the past 240 hours)  Resp panel by RT-PCR (RSV, Flu A&B, Covid) Anterior Nasal Swab     Status: Abnormal   Collection Time: 08/24/23  2:50 PM   Specimen: Anterior Nasal Swab  Result Value Ref Range Status   SARS Coronavirus 2 by RT PCR NEGATIVE NEGATIVE Final    Comment: (NOTE) SARS-CoV-2 target nucleic acids are NOT DETECTED.  The SARS-CoV-2 RNA is generally detectable in upper respiratory specimens during the acute phase of infection. The lowest concentration of SARS-CoV-2 viral copies this assay can detect is 138 copies/mL. A negative result does not preclude SARS-Cov-2 infection and should not be used as the sole basis for treatment or other patient management decisions. A negative result may occur with  improper specimen collection/handling, submission of specimen other than nasopharyngeal swab, presence of viral mutation(s) within the areas targeted by this assay, and inadequate number of viral copies(<138 copies/mL). A negative result must be combined with clinical observations, patient history, and epidemiological information. The expected result is Negative.  Fact Sheet for Patients:  bloggercourse.com  Fact Sheet for Healthcare Providers:  seriousbroker.it  This test is no t yet approved or cleared by the [place]  FDA and  has been authorized for detection and/or diagnosis of SARS-CoV-2 by FDA under an Emergency Use Authorization (EUA). This EUA will remain  in effect (meaning this test can be used) for the duration of the COVID-19  declaration under Section 564(b)(1) of the Act, 21 U.S.C.section 360bbb-3(b)(1), unless the authorization is terminated  or revoked sooner.       Influenza A by PCR POSITIVE (A) NEGATIVE Final   Influenza B by PCR NEGATIVE NEGATIVE Final    Comment: (NOTE) The Xpert Xpress SARS-CoV-2/FLU/RSV plus assay is intended as an aid in the diagnosis of influenza from Nasopharyngeal swab specimens and should not be used as a sole basis for treatment. Nasal washings and aspirates are unacceptable for Xpert Xpress SARS-CoV-2/FLU/RSV testing.  Fact Sheet for Patients: bloggercourse.com  Fact Sheet for Healthcare Providers: seriousbroker.it  This test is not yet approved or cleared by the [place]  FDA and has been authorized for detection and/or diagnosis of SARS-CoV-2 by FDA under an Emergency Use Authorization (EUA). This EUA will remain in effect (meaning this test can be used) for the duration of the COVID-19 declaration under Section 564(b)(1) of the Act, 21 U.S.C. section 360bbb-3(b)(1), unless the authorization is terminated or revoked.     Resp Syncytial Virus by PCR POSITIVE (A) NEGATIVE Final    Comment: (NOTE) Fact Sheet for Patients: bloggercourse.com  Fact Sheet for Healthcare Providers: seriousbroker.it  This test is not yet approved or cleared by the [place]  FDA and has been authorized for detection and/or diagnosis of SARS-CoV-2 by FDA under an Emergency Use Authorization (EUA). This EUA will remain in effect (meaning this test can be used) for the duration of the COVID-19 declaration under Section 564(b)(1) of the Act, 21 U.S.C. section 360bbb-3(b)(1), unless the authorization is terminated or revoked.  Performed at [facility], [address] [place], [place] [address]   Culture, blood (Routine x 2)     Status: None (Preliminary result)   Collection Time:  08/24/23  3:29 PM   Specimen: BLOOD  Result Value Ref Range Status  Specimen Description BLOOD BLOOD LEFT HAND  Final   Special Requests   Final    BOTTLES DRAWN AEROBIC AND ANAEROBIC Blood Culture adequate volume   Culture   Final    NO GROWTH < 24 HOURS Performed at [facility], [address] [place], [place] [address]    Report Status PENDING  Incomplete  Culture, blood (Routine x 2)     Status: None (Preliminary result)   Collection Time: 08/24/23  3:29 PM   Specimen: BLOOD  Result Value Ref Range Status   Specimen Description BLOOD BLOOD LEFT ARM  Final   Special Requests   Final    BOTTLES DRAWN AEROBIC AND ANAEROBIC Blood Culture adequate volume   Culture   Final    NO GROWTH < 24 HOURS Performed at [facility], [address], [place], [place] [address]    Report Status PENDING  Incomplete    Radiology Studies: DG Chest Port 1 View Result Date: 08/24/2023 CLINICAL DATA:  Sepsis EXAM: PORTABLE CHEST 1 VIEW COMPARISON:  Chest x-ray 08/14/2023 FINDINGS: The heart is enlarged. There are increased central interstitial markings bilaterally. There is no focal lung infiltrate, pleural effusion or pneumothorax. No acute fractures are seen. IMPRESSION: Cardiomegaly with increased central interstitial markings bilaterally, which may represent pulmonary edema. Electronically Signed   By: Greig Pique M.D.   On: 08/24/2023 16:56   Scheduled Meds:  amLODipine   5 mg Oral Daily   ARIPiprazole   10 mg Oral QHS   aspirin  EC  81 mg Oral Daily   atorvastatin   80 mg Oral Daily   enoxaparin  (LOVENOX ) injection  30 mg Subcutaneous Q24H   fluticasone  furoate-vilanterol  1 puff Inhalation Daily   guaiFENesin   600 mg Oral BID   insulin  aspart  0-20 Units Subcutaneous TID WC   insulin  aspart  0-5 Units Subcutaneous QHS   [START ON 08/27/2023] oseltamivir   30 mg Oral Q M,W,F-HD   pantoprazole   40 mg Oral Daily   predniSONE   40 mg Oral Q breakfast   Continuous Infusions:  azithromycin  500 mg  (08/25/23 1752)   [START ON 08/27/2023] ceFEPime  (MAXIPIME ) IV      LOS: 1 day   Rendall Carwin M.D on 08/25/2023 at 6:09 PM  Go to [email or web address] - for contact info  [facility] - Office  [phone]  If 7PM-7AM, please contact night-coverage [email or web address] 08/25/2023, 6:09 PM

## 2023-08-25 NOTE — ED Notes (Signed)
 ED TO INPATIENT HANDOFF REPORT  ED Nurse Name and Phone #: [phone]  S Name/Age/Gender [name] [name] 73 y.o. male Room/Bed: APA19/APA19  Code Status   Code Status: Full Code  Home/SNF/Other Home Patient oriented to: self, place, time, and situation Is this baseline? Yes   Triage Complete: Triage complete  Chief Complaint Acute respiratory failure with hypoxia (HCC) [J96.01]  Triage Note Pt with cough and chest congestion x 2 weeks.pt has been on two different antibiotics.     Allergies Allergies  Allergen Reactions   Opana [Oxymorphone Hcl] Itching   Tramadol  Itching    Level of Care/Admitting Diagnosis ED Disposition     ED Disposition  Admit   Condition  --   Comment  Hospital Area: [facility] [[id number]]  Level of Care: Telemetry [5]  Covid Evaluation: Asymptomatic - no recent exposure (last 10 days) testing not required  Diagnosis: Acute respiratory failure with hypoxia [facility]) [327266]  Admitting Physician: STINSON, JACOB J [[id number]]  Attending Physician: STINSON, JACOB J [[id number]]  Certification:: I certify this patient will need inpatient services for at least 2 midnights  Expected Medical Readiness: 08/28/2023          B Medical/Surgery History Past Medical History:  Diagnosis Date   Anemia    Arthritis    Asthma    Bell palsy    CAD (coronary artery disease)    a. 2014 MV: abnl w/ infap ischemia; b. 03/2013 Cath: aneurysmal bleb in the LAD w/ otw nonobs dzs-->Med Rx.   Chronic back pain    Chronic knee pain    a. 09/2015 s/p R TKA.   Chronic pain    Chronic shoulder pain    Chronic sinusitis    COPD (chronic obstructive pulmonary disease) (HCC)    Diabetes mellitus without complication (HCC)    type II    ESRD on peritoneal dialysis (HCC)    on peritoneal dialysis, [facility]   Essential hypertension    GERD (gastroesophageal reflux disease)    Gout    Hepatomegaly    noted on noncontrast CT 2015   History of hiatal hernia     Hyperlipidemia    Lateral meniscus tear    Obesity    Truncal   Obstructive sleep apnea    does not use cpap    On home oxygen  therapy    uses 2l when is going somewhere per patient    PUD (peptic ulcer disease)    remote, reports f/u EGD about 8 years ago unremarkable    Reactive airway disease    related to exposure to chemical during 9/11   Sinusitis    Vitamin D  deficiency    Past Surgical History:  Procedure Laterality Date   A/V FISTULAGRAM N/A 02/28/2023   Procedure: A/V Fistulagram;  Surgeon: Lanis Fonda BRAVO, MD;  Location: [facility] INVASIVE CV LAB;  Service: Cardiovascular;  Laterality: N/A;   ASAD LT SHOULDER  12/15/2008   left shoulder   AV FISTULA PLACEMENT Left 08/09/2016   Procedure: BRACHIOCEPHALIC ARTERIOVENOUS (AV) FISTULA CREATION LEFT ARM;  Surgeon: Carlin BRAVO Haddock, MD;  Location: [facility] OR;  Service: Vascular;  Laterality: Left;   CAPD INSERTION N/A 10/07/2018   Procedure: LAPAROSCOPIC PERITONEAL CATHETER PLACEMENT;  Surgeon: Stevie Herlene Righter, MD;  Location: [facility] ORS;  Service: General;  Laterality: N/A;   CATARACT EXTRACTION W/PHACO Left 03/28/2016   Procedure: CATARACT EXTRACTION PHACO AND INTRAOCULAR LENS PLACEMENT LEFT EYE;  Surgeon: Oneil Platts, MD;  Location: [facility] ORS;  Service:  Ophthalmology;  Laterality: Left;  CDE: 4.77   CATARACT EXTRACTION W/PHACO Right 04/11/2016   Procedure: CATARACT EXTRACTION PHACO AND INTRAOCULAR LENS PLACEMENT RIGHT EYE; CDE:  4.74;  Surgeon: Oneil Platts, MD;  Location: [facility] ORS;  Service: Ophthalmology;  Laterality: Right;   COLONOSCOPY  10/15/2008   Fields: Rectal polyp obliterated, not retrieved, hemorrhoids, single ascending colon diverticulum near the CV. Next colonoscopy April 2020   COLONOSCOPY N/A 12/25/2014   SLF: 1. Colorectal polyps (2) removed 2. Small internal hemorrhoids 3. the left colon is severely redundant. hyperplastic polyps   CORONARY ARTERY BYPASS GRAFT N/A 06/05/2022   Procedure: OFF PUMP CORONARY ARTERY BYPASS  GRAFTING (CABG) X 2 BYPASSES USING LEFT INTERNAL MAMMARY ARTERY AND RIGHT LEG GREATER SAPHENOUS VEIN HARVESTED ENDOSCOPICALLY;  Surgeon: Shyrl Linnie KIDD, MD;  Location: [facility] OR;  Service: Open Heart Surgery;  Laterality: N/A;   CORONARY STENT INTERVENTION N/A 07/25/2021   Procedure: CORONARY STENT INTERVENTION;  Surgeon: Wonda Sharper, MD;  Location: [facility] INVASIVE CV LAB;  Service: Cardiovascular;  Laterality: N/A;   CORONARY STENT INTERVENTION N/A 12/26/2021   Procedure: CORONARY STENT INTERVENTION;  Surgeon: Mady Bruckner, MD;  Location: [facility] INVASIVE CV LAB;  Service: Cardiovascular;  Laterality: N/A;   CORONARY STENT INTERVENTION N/A 01/20/2022   Procedure: CORONARY STENT INTERVENTION;  Surgeon: Wonda Sharper, MD;  Location: [facility] INVASIVE CV LAB;  Service: Cardiovascular;  Laterality: N/A;   DOPPLER ECHOCARDIOGRAPHY     ESOPHAGOGASTRODUODENOSCOPY N/A 12/25/2014   SLF: 1. Anemia most likely due to CRI, gastritis, gastric polyps 2. Moderate non-erosive gastriits and mild duodenitis.  3.TWo large gstric polyps removed.    EYE SURGERY  12/22/2010   tear duct probing-[place]   FOREIGN BODY REMOVAL  03/29/2011   Procedure: REMOVAL FOREIGN BODY EXTREMITY;  Surgeon: Taft Minerva, MD;  Location: [facility]S;  Service: Orthopedics;  Laterality: Right;  Removal Foreign Body Right Thumb   IR FLUORO GUIDE CV LINE RIGHT  08/06/2018   IR FLUORO GUIDE CV LINE RIGHT  02/17/2023   IR US  GUIDE VASC ACCESS RIGHT  08/06/2018   IR US  GUIDE VASC ACCESS RIGHT  02/17/2023   KNEE ARTHROSCOPY  10/16/2007   left   KNEE ARTHROSCOPY WITH LATERAL MENISECTOMY Right 10/14/2015   Procedure: LEFT KNEE ARTHROSCOPY WITH PARTIAL LATERAL MENISECTOMY;  Surgeon: Taft FORBES Minerva, MD;  Location: [facility];  Service: Orthopedics;  Laterality: Right;   LEFT HEART CATH AND CORONARY ANGIOGRAPHY N/A 07/25/2021   Procedure: LEFT HEART CATH AND CORONARY ANGIOGRAPHY;  Surgeon: Wonda Sharper, MD;  Location: [facility] INVASIVE CV LAB;  Service:  Cardiovascular;  Laterality: N/A;   LEFT HEART CATH AND CORONARY ANGIOGRAPHY N/A 12/26/2021   Procedure: LEFT HEART CATH AND CORONARY ANGIOGRAPHY;  Surgeon: Mady Bruckner, MD;  Location: [facility] INVASIVE CV LAB;  Service: Cardiovascular;  Laterality: N/A;   LEFT HEART CATH AND CORONARY ANGIOGRAPHY N/A 01/20/2022   Procedure: LEFT HEART CATH AND CORONARY ANGIOGRAPHY;  Surgeon: Wonda Sharper, MD;  Location: [facility] INVASIVE CV LAB;  Service: Cardiovascular;  Laterality: N/A;   LEFT HEART CATHETERIZATION WITH CORONARY ANGIOGRAM N/A 03/28/2013   Procedure: LEFT HEART CATHETERIZATION WITH CORONARY ANGIOGRAM;  Surgeon: Alm LELON Clay, MD;  Location: [facility] CATH LAB;  Service: Cardiovascular;  Laterality: N/A;   NM MYOVIEW  LTD     PENILE PROSTHESIS IMPLANT N/A 08/16/2015   Procedure: PENILE PROTHESIS INFLATABLE, three piece, Excisional biopsy of Penile ulcer, Penile molding;  Surgeon: Arlena Gal, MD;  Location: [facility]S;  Service: Urology;  Laterality: N/A;   PENILE PROSTHESIS IMPLANT  N/A 12/24/2017   Procedure: REMOVAL AND  REPLACEMENT  COLOPLAST PENILE PROSTHESIS;  Surgeon: Carolee Sherwood JONETTA DOUGLAS, MD;  Location: [facility]S;  Service: Urology;  Laterality: N/A;   PERIPHERAL VASCULAR BALLOON ANGIOPLASTY  02/28/2023   Procedure: PERIPHERAL VASCULAR BALLOON ANGIOPLASTY;  Surgeon: Lanis Fonda BRAVO, MD;  Location: [facility] INVASIVE CV LAB;  Service: Cardiovascular;;   PORT-A-CATH REMOVAL N/A 02/19/2023   Procedure: REMOVAL OF PERITONEAL DIALYSIS CATHETER;  Surgeon: Lanis Fonda BRAVO, MD;  Location: [facility] OR;  Service: Vascular;  Laterality: N/A;   QUADRICEPS TENDON REPAIR  07/21/2011   Procedure: REPAIR QUADRICEP TENDON;  Surgeon: Taft Minerva, MD;  Location: [facility];  Service: Orthopedics;  Laterality: Right;   RIGHT/LEFT HEART CATH AND CORONARY ANGIOGRAPHY N/A 05/30/2022   Procedure: RIGHT/LEFT HEART CATH AND CORONARY ANGIOGRAPHY;  Surgeon: Dann Candyce RAMAN, MD;  Location: [facility] INVASIVE CV LAB;  Service: Cardiovascular;   Laterality: N/A;   TEE WITHOUT CARDIOVERSION N/A 06/05/2022   Procedure: TRANSESOPHAGEAL ECHOCARDIOGRAM (TEE);  Surgeon: Shyrl Linnie KIDD, MD;  Location: [facility] OR;  Service: Open Heart Surgery;  Laterality: N/A;   TOENAIL EXCISION     removed x2-bilateral   UMBILICAL HERNIA REPAIR  07/17/2005   [place]     A IV Location/Drains/Wounds Patient Lines/Drains/Airways Status     Active Line/Drains/Airways     Name Placement date Placement time Site Days   Peripheral IV 08/24/23 20 G Right Antecubital 08/24/23  1534  Antecubital  1   Peripheral IV 08/24/23 22 G Anterior;Distal;Right Forearm 08/24/23  1618  Forearm  1   Fistula / Graft Left Other (Comment) Arteriovenous fistula 08/09/16  1327  Other (Comment)  2572            Intake/Output Last 24 hours No intake or output data in the 24 hours ending 08/25/23 1226  Labs/Imaging Results for orders placed or performed during the hospital encounter of 08/24/23 (from the past 48 hours)  Resp panel by RT-PCR (RSV, Flu A&B, Covid) Anterior Nasal Swab     Status: Abnormal   Collection Time: 08/24/23  2:50 PM   Specimen: Anterior Nasal Swab  Result Value Ref Range   SARS Coronavirus 2 by RT PCR NEGATIVE NEGATIVE    Comment: (NOTE) SARS-CoV-2 target nucleic acids are NOT DETECTED.  The SARS-CoV-2 RNA is generally detectable in upper respiratory specimens during the acute phase of infection. The lowest concentration of SARS-CoV-2 viral copies this assay can detect is 138 copies/mL. A negative result does not preclude SARS-Cov-2 infection and should not be used as the sole basis for treatment or other patient management decisions. A negative result may occur with  improper specimen collection/handling, submission of specimen other than nasopharyngeal swab, presence of viral mutation(s) within the areas targeted by this assay, and inadequate number of viral copies(<138 copies/mL). A negative result must be combined with clinical  observations, patient history, and epidemiological information. The expected result is Negative.  Fact Sheet for Patients:  bloggercourse.com  Fact Sheet for Healthcare Providers:  seriousbroker.it  This test is no t yet approved or cleared by the [place]  FDA and  has been authorized for detection and/or diagnosis of SARS-CoV-2 by FDA under an Emergency Use Authorization (EUA). This EUA will remain  in effect (meaning this test can be used) for the duration of the COVID-19 declaration under Section 564(b)(1) of the Act, 21 U.S.C.section 360bbb-3(b)(1), unless the authorization is terminated  or revoked sooner.       Influenza A by PCR POSITIVE (A) NEGATIVE  Influenza B by PCR NEGATIVE NEGATIVE    Comment: (NOTE) The Xpert Xpress SARS-CoV-2/FLU/RSV plus assay is intended as an aid in the diagnosis of influenza from Nasopharyngeal swab specimens and should not be used as a sole basis for treatment. Nasal washings and aspirates are unacceptable for Xpert Xpress SARS-CoV-2/FLU/RSV testing.  Fact Sheet for Patients: bloggercourse.com  Fact Sheet for Healthcare Providers: seriousbroker.it  This test is not yet approved or cleared by the [place]  FDA and has been authorized for detection and/or diagnosis of SARS-CoV-2 by FDA under an Emergency Use Authorization (EUA). This EUA will remain in effect (meaning this test can be used) for the duration of the COVID-19 declaration under Section 564(b)(1) of the Act, 21 U.S.C. section 360bbb-3(b)(1), unless the authorization is terminated or revoked.     Resp Syncytial Virus by PCR POSITIVE (A) NEGATIVE    Comment: (NOTE) Fact Sheet for Patients: bloggercourse.com  Fact Sheet for Healthcare Providers: seriousbroker.it  This test is not yet approved or cleared by the [place]  [place] FDA and has been authorized for detection and/or diagnosis of SARS-CoV-2 by FDA under an Emergency Use Authorization (EUA). This EUA will remain in effect (meaning this test can be used) for the duration of the COVID-19 declaration under Section 564(b)(1) of the Act, 21 U.S.C. section 360bbb-3(b)(1), unless the authorization is terminated or revoked.  Performed at [facility], [address], [place], [place] [address]   Culture, blood (Routine x 2)     Status: None (Preliminary result)   Collection Time: 08/24/23  3:29 PM   Specimen: BLOOD  Result Value Ref Range   Specimen Description BLOOD BLOOD LEFT HAND    Special Requests      BOTTLES DRAWN AEROBIC AND ANAEROBIC Blood Culture adequate volume   Culture      NO GROWTH < 24 HOURS Performed at [facility], [address] [place], [place] [address]    Report Status PENDING   Culture, blood (Routine x 2)     Status: None (Preliminary result)   Collection Time: 08/24/23  3:29 PM   Specimen: BLOOD  Result Value Ref Range   Specimen Description BLOOD BLOOD LEFT ARM    Special Requests      BOTTLES DRAWN AEROBIC AND ANAEROBIC Blood Culture adequate volume   Culture      NO GROWTH < 24 HOURS Performed at [facility], [address], [place], [place] [address]    Report Status PENDING   Comprehensive metabolic panel     Status: Abnormal   Collection Time: 08/24/23  3:33 PM  Result Value Ref Range   Sodium 137 135 - 145 mmol/L   Potassium 3.7 3.5 - 5.1 mmol/L   Chloride 96 (L) 98 - 111 mmol/L   CO2 28 22 - 32 mmol/L   Glucose, Bld 121 (H) 70 - 99 mg/dL    Comment: Glucose reference range applies only to samples taken after fasting for at least 8 hours.   BUN 15 8 - 23 mg/dL   Creatinine, Ser 5.17 (H) 0.61 - 1.24 mg/dL   Calcium  8.5 (L) 8.9 - 10.3 mg/dL   Total Protein 7.9 6.5 - 8.1 g/dL   Albumin  3.1 (L) 3.5 - 5.0 g/dL   AST 28 15 - 41 U/L   ALT 22 0 - 44 U/L   Alkaline Phosphatase 70 38 - 126 U/L   Total Bilirubin  0.9 0.0 - 1.2 mg/dL   GFR, Estimated 12 (L) >60 mL/min    Comment: (  NOTE) Calculated using the CKD-EPI Creatinine Equation (2021)    Anion gap 13 5 - 15    Comment: Performed at [facility], [address] [place], [place] [address]  Lactic acid, plasma     Status: None   Collection Time: 08/24/23  3:33 PM  Result Value Ref Range   Lactic Acid, Venous 1.4 0.5 - 1.9 mmol/L    Comment: Performed at [facility], [address], [place], [place] [address]  CBC with Differential     Status: Abnormal   Collection Time: 08/24/23  3:33 PM  Result Value Ref Range   WBC 5.2 4.0 - 10.5 K/uL   RBC 3.16 (L) 4.22 - 5.81 MIL/uL   Hemoglobin 8.4 (L) 13.0 - 17.0 g/dL   HCT 73.3 (L) 60.9 - 47.9 %   MCV 84.2 80.0 - 100.0 fL   MCH 26.6 26.0 - 34.0 pg   MCHC 31.6 30.0 - 36.0 g/dL   RDW 82.8 (H) 88.4 - 84.4 %   Platelets 216 150 - 400 K/uL   nRBC 0.0 0.0 - 0.2 %   Neutrophils Relative % 77 %   Neutro Abs 4.0 1.7 - 7.7 K/uL   Lymphocytes Relative 7 %   Lymphs Abs 0.4 (L) 0.7 - 4.0 K/uL   Monocytes Relative 11 %   Monocytes Absolute 0.6 0.1 - 1.0 K/uL   Eosinophils Relative 3 %   Eosinophils Absolute 0.2 0.0 - 0.5 K/uL   Basophils Relative 1 %   Basophils Absolute 0.1 0.0 - 0.1 K/uL   Immature Granulocytes 1 %   Abs Immature Granulocytes 0.03 0.00 - 0.07 K/uL    Comment: Performed at [facility], [address] [place], [place] [address]  Protime-INR     Status: None   Collection Time: 08/24/23  3:33 PM  Result Value Ref Range   Prothrombin Time 15.1 11.4 - 15.2 seconds   INR 1.2 0.8 - 1.2    Comment: (NOTE) INR goal varies based on device and disease states. Performed at [facility], [address] [place], [place] [address]   Procalcitonin     Status: None   Collection Time: 08/24/23  3:33 PM  Result Value Ref Range   Procalcitonin 1.15 ng/mL    Comment:        Interpretation: PCT > 0.5 ng/mL and <= 2 ng/mL: Systemic infection (sepsis) is possible, but other conditions are known  to elevate PCT as well. (NOTE)       Sepsis PCT Algorithm           Lower Respiratory Tract                                      Infection PCT Algorithm    ----------------------------     ----------------------------         PCT < 0.25 ng/mL                PCT < 0.10 ng/mL          Strongly encourage             Strongly discourage   discontinuation of antibiotics    initiation of antibiotics    ----------------------------     -----------------------------       PCT 0.25 - 0.50 ng/mL            PCT 0.10 - 0.25 ng/mL  OR       >80% decrease in PCT            Discourage initiation of                                            antibiotics      Encourage discontinuation           of antibiotics    ----------------------------     -----------------------------         PCT >= 0.50 ng/mL              PCT 0.26 - 0.50 ng/mL                AND       <80% decrease in PCT             Encourage initiation of                                             antibiotics       Encourage continuation           of antibiotics    ----------------------------     -----------------------------        PCT >= 0.50 ng/mL                  PCT > 0.50 ng/mL               AND         increase in PCT                  Strongly encourage                                      initiation of antibiotics    Strongly encourage escalation           of antibiotics                                     -----------------------------                                           PCT <= 0.25 ng/mL                                                 OR                                        > 80% decrease in PCT                                      Discontinue / Do not initiate  antibiotics  Performed at [facility], [address] [place], [place] [address]   APTT     Status: None   Collection Time: 08/24/23  3:40 PM  Result Value Ref Range   aPTT 28 24 - 36 seconds     Comment: Performed at [facility], [address], [place], [place] [address]  Lactic acid, plasma     Status: Abnormal   Collection Time: 08/24/23  5:23 PM  Result Value Ref Range   Lactic Acid, Venous 2.3 (HH) 0.5 - 1.9 mmol/L    Comment: CRITICAL RESULT CALLED TO, READ BACK BY AND VERIFIED  WITH EANES,M ON 08/24/23 AT 1745 BY LOY,C Performed at [facility], [address] [place], [place] [address]   Lactic acid, plasma     Status: None   Collection Time: 08/24/23  8:45 PM  Result Value Ref Range   Lactic Acid, Venous 1.2 0.5 - 1.9 mmol/L    Comment: Performed at [facility], [address] [place], [place] [address]  CBG monitoring, ED     Status: Abnormal   Collection Time: 08/24/23 11:28 PM  Result Value Ref Range   Glucose-Capillary 141 (H) 70 - 99 mg/dL    Comment: Glucose reference range applies only to samples taken after fasting for at least 8 hours.  Basic metabolic panel     Status: Abnormal   Collection Time: 08/25/23  3:31 AM  Result Value Ref Range   Sodium 137 135 - 145 mmol/L   Potassium 4.8 3.5 - 5.1 mmol/L   Chloride 98 98 - 111 mmol/L   CO2 25 22 - 32 mmol/L   Glucose, Bld 152 (H) 70 - 99 mg/dL    Comment: Glucose reference range applies only to samples taken after fasting for at least 8 hours.   BUN 24 (H) 8 - 23 mg/dL   Creatinine, Ser 3.96 (H) 0.61 - 1.24 mg/dL   Calcium  8.5 (L) 8.9 - 10.3 mg/dL   GFR, Estimated 9 (L) >60 mL/min    Comment: (NOTE) Calculated using the CKD-EPI Creatinine Equation (2021)    Anion gap 14 5 - 15    Comment: Performed at [facility], [address], [place], [place] [address]  CBC     Status: Abnormal   Collection Time: 08/25/23  3:31 AM  Result Value Ref Range   WBC 4.0 4.0 - 10.5 K/uL   RBC 3.16 (L) 4.22 - 5.81 MIL/uL   Hemoglobin 8.0 (L) 13.0 - 17.0 g/dL   HCT 72.8 (L) 60.9 - 47.9 %   MCV 85.8 80.0 - 100.0 fL   MCH 25.3 (L) 26.0 - 34.0 pg   MCHC 29.5 (L) 30.0 - 36.0 g/dL   RDW 82.9 (H) 88.4 - 84.4 %   Platelets 171  150 - 400 K/uL   nRBC 0.0 0.0 - 0.2 %    Comment: Performed at [facility], [address] [place], [place] [address]  CBG monitoring, ED     Status: Abnormal   Collection Time: 08/25/23  7:51 AM  Result Value Ref Range   Glucose-Capillary 152 (H) 70 - 99 mg/dL    Comment: Glucose reference range applies only to samples taken after fasting for at least 8 hours.   *Note: Due to a large number of results and/or encounters for the requested time period, some results have not been displayed. A complete set of results can be found in Results Review.   DG Chest Port 1 View Result Date: 08/24/2023 CLINICAL  DATA:  Sepsis EXAM: PORTABLE CHEST 1 VIEW COMPARISON:  Chest x-ray 08/14/2023 FINDINGS: The heart is enlarged. There are increased central interstitial markings bilaterally. There is no focal lung infiltrate, pleural effusion or pneumothorax. No acute fractures are seen. IMPRESSION: Cardiomegaly with increased central interstitial markings bilaterally, which may represent pulmonary edema. Electronically Signed   By: Greig Pique M.D.   On: 08/24/2023 16:56    Pending Labs Unresulted Labs (From admission, onward)     Start     Ordered   08/24/23 1956  Legionella Pneumophila Serogp 1 Ur Ag  (COPD / Pneumonia / Cellulitis / Lower Extremity Wound)  Once,   R        08/24/23 1955   08/24/23 1956  Strep pneumoniae urinary antigen  (COPD / Pneumonia / Cellulitis / Lower Extremity Wound)  Once,   R        08/24/23 1955   08/24/23 1545  MRSA Next Gen by PCR, Nasal  (MRSA Screening)  Once,   URGENT        08/24/23 1544   08/24/23 1454  Urinalysis, w/ Reflex to Culture (Infection Suspected) -Urine, Clean Catch  Once,   URGENT       Question:  Specimen Source  Answer:  Urine, Clean Catch   08/24/23 1453            Vitals/Pain Today's Vitals   08/25/23 1000 08/25/23 1030 08/25/23 1100 08/25/23 1200  BP: 118/60 122/70 (!) 141/68 (!) 120/45  Pulse: (!) 54 (!) 54 (!) 59 63  Resp: (!) 21 (!) 21  (!) 21 (!) 23  Temp:      TempSrc:      SpO2: 91% 96% 98% 94%  Weight:      Height:      PainSc:        Isolation Precautions No active isolations  Medications Medications  lactated ringers  infusion (0 mLs Intravenous Stopped 08/25/23 0809)  oseltamivir  (TAMIFLU ) capsule 30 mg (30 mg Oral Given 08/24/23 1850)  aspirin  EC tablet 81 mg (81 mg Oral Given 08/25/23 0924)  amLODipine  (NORVASC ) tablet 5 mg (5 mg Oral Given 08/25/23 0923)  atorvastatin  (LIPITOR ) tablet 80 mg (80 mg Oral Given 08/25/23 0924)  ARIPiprazole  (ABILIFY ) tablet 10 mg (10 mg Oral Given 08/24/23 2147)  pantoprazole  (PROTONIX ) EC tablet 40 mg (40 mg Oral Given 08/25/23 0924)  fluticasone  furoate-vilanterol (BREO ELLIPTA ) 200-25 MCG/ACT 1 puff (0 puffs Inhalation Hold 08/24/23 2009)  predniSONE  (DELTASONE ) tablet 40 mg (40 mg Oral Given 08/24/23 2150)  enoxaparin  (LOVENOX ) injection 30 mg (30 mg Subcutaneous Given 08/24/23 2150)  ondansetron  (ZOFRAN ) tablet 4 mg (has no administration in time range)    Or  ondansetron  (ZOFRAN ) injection 4 mg (has no administration in time range)  guaiFENesin  (MUCINEX ) 12 hr tablet 600 mg (600 mg Oral Given 08/25/23 0923)  insulin  aspart (novoLOG ) injection 0-20 Units (4 Units Subcutaneous Given 08/25/23 0800)  insulin  aspart (novoLOG ) injection 0-5 Units ( Subcutaneous Not Given 08/24/23 2331)  azithromycin  (ZITHROMAX ) 500 mg in sodium chloride  0.9 % 250 mL IVPB (has no administration in time range)  albuterol  (PROVENTIL ) (2.5 MG/3ML) 0.083% nebulizer solution 2.5 mg (has no administration in time range)  ceFEPIme  (MAXIPIME ) 2 g in sodium chloride  0.9 % 100 mL IVPB (has no administration in time range)  ceFEPIme  (MAXIPIME ) 2 g in sodium chloride  0.9 % 100 mL IVPB (0 g Intravenous Stopped 08/24/23 1659)  azithromycin  (ZITHROMAX ) 500 mg in sodium chloride  0.9 % 250 mL IVPB (0 mg  Intravenous Stopped 08/24/23 1750)  vancomycin  (VANCOREADY) IVPB 2000 mg/400 mL (0 mg Intravenous Stopped 08/24/23 1801)  acetaminophen   (TYLENOL ) tablet 1,000 mg (1,000 mg Oral Given 08/24/23 1618)  lactated ringers  bolus 500 mL (0 mLs Intravenous Stopped 08/24/23 2132)    Mobility walks with person assist     Focused Assessments Pulmonary Assessment Handoff:  Lung sounds: Bilateral Breath Sounds: Expiratory wheezes L Breath Sounds: Expiratory wheezes R Breath Sounds: Expiratory wheezes O2 Device: Nasal Cannula O2 Flow Rate (L/min): 2 L/min    R Recommendations: See Admitting Provider Note  Report given to:   Additional Notes: FLU, RSV

## 2023-08-26 DIAGNOSIS — I2510 Atherosclerotic heart disease of native coronary artery without angina pectoris: Secondary | ICD-10-CM | POA: Diagnosis not present

## 2023-08-26 DIAGNOSIS — J9601 Acute respiratory failure with hypoxia: Secondary | ICD-10-CM | POA: Diagnosis not present

## 2023-08-26 DIAGNOSIS — Z992 Dependence on renal dialysis: Secondary | ICD-10-CM | POA: Diagnosis not present

## 2023-08-26 DIAGNOSIS — N186 End stage renal disease: Secondary | ICD-10-CM | POA: Diagnosis not present

## 2023-08-26 DIAGNOSIS — I480 Paroxysmal atrial fibrillation: Secondary | ICD-10-CM

## 2023-08-26 DIAGNOSIS — I5020 Unspecified systolic (congestive) heart failure: Secondary | ICD-10-CM | POA: Diagnosis not present

## 2023-08-26 LAB — GLUCOSE, CAPILLARY
Glucose-Capillary: 156 mg/dL — ABNORMAL HIGH (ref 70–99)
Glucose-Capillary: 188 mg/dL — ABNORMAL HIGH (ref 70–99)

## 2023-08-26 MED ORDER — PREDNISONE 20 MG PO TABS: 40.0000 mg | ORAL_TABLET | Freq: Every day | ORAL | 0 refills | Status: AC

## 2023-08-26 MED ORDER — AMLODIPINE BESYLATE 5 MG PO TABS: 5.0000 mg | ORAL_TABLET | Freq: Every day | ORAL | 5 refills | Status: AC

## 2023-08-26 MED ORDER — OSELTAMIVIR PHOSPHATE 30 MG PO CAPS: 30.0000 mg | ORAL_CAPSULE | ORAL | 0 refills | Status: AC

## 2023-08-26 MED ORDER — GUAIFENESIN ER 600 MG PO TB12: 600.0000 mg | ORAL_TABLET | Freq: Two times a day (BID) | ORAL | 0 refills | Status: AC

## 2023-08-26 MED ORDER — ALBUTEROL SULFATE HFA 108 (90 BASE) MCG/ACT IN AERS: 2.0000 | INHALATION_SPRAY | Freq: Four times a day (QID) | RESPIRATORY_TRACT | 11 refills | Status: DC | PRN

## 2023-08-26 MED ORDER — FLUTICASONE FUROATE-VILANTEROL 200-25 MCG/ACT IN AEPB: 1.0000 | INHALATION_SPRAY | Freq: Every day | RESPIRATORY_TRACT | 5 refills | Status: DC

## 2023-08-26 NOTE — Plan of Care (Signed)
  Problem: Fluid Volume: Goal: Ability to maintain a balanced intake and output will improve Outcome: Progressing   Problem: Metabolic: Goal: Ability to maintain appropriate glucose levels will improve Outcome: Progressing   Problem: Nutritional: Goal: Progress toward achieving an optimal weight will improve Outcome: Progressing   Problem: Coping: Goal: Level of anxiety will decrease Outcome: Progressing   Problem: Activity: Goal: Ability to tolerate increased activity will improve Outcome: Progressing   Problem: Respiratory: Goal: Ability to maintain a clear airway will improve Outcome: Progressing   Problem: Respiratory: Goal: Ability to maintain adequate ventilation will improve Outcome: Progressing

## 2023-08-26 NOTE — Progress Notes (Signed)
 Pt remained awake for the entire shift, stated he wasn't tired and wanted to walk the halls to stretch his legs. The pt walked the halls 3 times during the night and sat up in the recliner with legs elevated after we noticed his legs swelling. Around 4am the patient called out complaining of his abdomen feeling tight, upon examination the patients right side was soft and palpable, the pts left side abdomen was found to be distended and tight. The patients lung sounds are unchanged since the beginning of shift with R&L upper expository wheezing and diminished R&L lower lung sounds. Pts fluid intake during shift was of water  for med consumption, pt has no complaints of pain just tightness on left side abdomen. Last BM was yesterday 08/25/23 and O2 sats remain at 100% on room air.

## 2023-08-26 NOTE — Discharge Instructions (Signed)
 1)Very Low-salt diet advised---Less than 2 gm of Sodium per day advised----ok to use Mrs DASH salt substitute instead of Salt 2)Weigh yourself daily, call if you gain more than 3 pounds in 1 day or more than 5 pounds in 1 week as your hemodialysis schedule may need to be adjusted 3)Limit your Fluid  intake to no more than 50 ounces (1.5 Liters) per day 4) please note that there has been some changes to your medications 5) continue hemodialysis on Monday Wednesday Friday as per usual schedule

## 2023-08-26 NOTE — Discharge Summary (Signed)
 [name] [name], is a 73 y.o. male  DOB 01-12-1951  MRN [id number].  Admission date:  08/24/2023  Admitting Physician  Lang JINNY Peel, DO  Discharge Date:  08/26/2023   Primary MD  Carlette Benita Area, MD  Recommendations for primary care physician for things to follow:   1)Very Low-salt diet advised---Less than 2 gm of Sodium per day advised----ok to use Mrs DASH salt substitute instead of Salt 2)Weigh yourself daily, call if you gain more than 3 pounds in 1 day or more than 5 pounds in 1 week as your hemodialysis schedule may need to be adjusted 3)Limit your Fluid  intake to no more than 50 ounces (1.5 Liters) per day 4) please note that there has been some changes to your medications 5) continue hemodialysis on Monday Wednesday Friday as per usual schedule  Admission Diagnosis  RSV (respiratory syncytial virus infection) [B33.8] Hypoxia [R09.02] End stage renal disease on dialysis (HCC) [N18.6, Z99.2] Acute respiratory failure with hypoxia (HCC) [J96.01] Influenza [J11.1]   Discharge Diagnosis  RSV (respiratory syncytial virus infection) [B33.8] Hypoxia [R09.02] End stage renal disease on dialysis (HCC) [N18.6, Z99.2] Acute respiratory failure with hypoxia (HCC) [J96.01] Influenza [J11.1]    Active Problems:   Type 2 diabetes mellitus with chronic kidney disease on chronic dialysis, with long-term current use of insulin  (HCC)   Primary hypertension   OSA on CPAP   CAD (coronary artery disease)   NSTEMI (non-ST elevated myocardial infarction) (HCC)   End stage renal disease on dialysis (HCC)   HFrEF (heart failure with reduced ejection fraction) (HCC)   PAF (paroxysmal atrial fibrillation) (HCC)   Long term (current) use of insulin  (HCC)   Acute respiratory failure with hypoxia (HCC)      Past Medical History:  Diagnosis Date   Anemia    Arthritis    Asthma    Bell palsy    CAD  (coronary artery disease)    a. 2014 MV: abnl w/ infap ischemia; b. 03/2013 Cath: aneurysmal bleb in the LAD w/ otw nonobs dzs-->Med Rx.   Chronic back pain    Chronic knee pain    a. 09/2015 s/p R TKA.   Chronic pain    Chronic shoulder pain    Chronic sinusitis    COPD (chronic obstructive pulmonary disease) (HCC)    Diabetes mellitus without complication (HCC)    type II    ESRD on peritoneal dialysis (HCC)    on peritoneal dialysis, [facility]   Essential hypertension    GERD (gastroesophageal reflux disease)    Gout    Hepatomegaly    noted on noncontrast CT 2015   History of hiatal hernia    Hyperlipidemia    Lateral meniscus tear    Obesity    Truncal   Obstructive sleep apnea    does not use cpap    On home oxygen  therapy    uses 2l when is going somewhere per patient    PUD (peptic ulcer disease)    remote, reports f/u EGD  about 8 years ago unremarkable    Reactive airway disease    related to exposure to chemical during 9/11   Sinusitis    Vitamin D  deficiency     Past Surgical History:  Procedure Laterality Date   A/V FISTULAGRAM N/A 02/28/2023   Procedure: A/V Fistulagram;  Surgeon: Lanis Fonda BRAVO, MD;  Location: [facility] INVASIVE CV LAB;  Service: Cardiovascular;  Laterality: N/A;   ASAD LT SHOULDER  12/15/2008   left shoulder   AV FISTULA PLACEMENT Left 08/09/2016   Procedure: BRACHIOCEPHALIC ARTERIOVENOUS (AV) FISTULA CREATION LEFT ARM;  Surgeon: Carlin BRAVO Haddock, MD;  Location: [facility] OR;  Service: Vascular;  Laterality: Left;   CAPD INSERTION N/A 10/07/2018   Procedure: LAPAROSCOPIC PERITONEAL CATHETER PLACEMENT;  Surgeon: Stevie Herlene Righter, MD;  Location: [facility] ORS;  Service: General;  Laterality: N/A;   CATARACT EXTRACTION W/PHACO Left 03/28/2016   Procedure: CATARACT EXTRACTION PHACO AND INTRAOCULAR LENS PLACEMENT LEFT EYE;  Surgeon: Oneil Platts, MD;  Location: [facility] ORS;  Service: Ophthalmology;  Laterality: Left;  CDE: 4.77   CATARACT EXTRACTION W/PHACO  Right 04/11/2016   Procedure: CATARACT EXTRACTION PHACO AND INTRAOCULAR LENS PLACEMENT RIGHT EYE; CDE:  4.74;  Surgeon: Oneil Platts, MD;  Location: [facility] ORS;  Service: Ophthalmology;  Laterality: Right;   COLONOSCOPY  10/15/2008   Fields: Rectal polyp obliterated, not retrieved, hemorrhoids, single ascending colon diverticulum near the CV. Next colonoscopy April 2020   COLONOSCOPY N/A 12/25/2014   SLF: 1. Colorectal polyps (2) removed 2. Small internal hemorrhoids 3. the left colon is severely redundant. hyperplastic polyps   CORONARY ARTERY BYPASS GRAFT N/A 06/05/2022   Procedure: OFF PUMP CORONARY ARTERY BYPASS GRAFTING (CABG) X 2 BYPASSES USING LEFT INTERNAL MAMMARY ARTERY AND RIGHT LEG GREATER SAPHENOUS VEIN HARVESTED ENDOSCOPICALLY;  Surgeon: Shyrl Linnie KIDD, MD;  Location: [facility] OR;  Service: Open Heart Surgery;  Laterality: N/A;   CORONARY STENT INTERVENTION N/A 07/25/2021   Procedure: CORONARY STENT INTERVENTION;  Surgeon: Wonda Sharper, MD;  Location: [facility] INVASIVE CV LAB;  Service: Cardiovascular;  Laterality: N/A;   CORONARY STENT INTERVENTION N/A 12/26/2021   Procedure: CORONARY STENT INTERVENTION;  Surgeon: Mady Bruckner, MD;  Location: [facility] INVASIVE CV LAB;  Service: Cardiovascular;  Laterality: N/A;   CORONARY STENT INTERVENTION N/A 01/20/2022   Procedure: CORONARY STENT INTERVENTION;  Surgeon: Wonda Sharper, MD;  Location: [facility] INVASIVE CV LAB;  Service: Cardiovascular;  Laterality: N/A;   DOPPLER ECHOCARDIOGRAPHY     ESOPHAGOGASTRODUODENOSCOPY N/A 12/25/2014   SLF: 1. Anemia most likely due to CRI, gastritis, gastric polyps 2. Moderate non-erosive gastriits and mild duodenitis.  3.TWo large gstric polyps removed.    EYE SURGERY  12/22/2010   tear duct probing-[place]   FOREIGN BODY REMOVAL  03/29/2011   Procedure: REMOVAL FOREIGN BODY EXTREMITY;  Surgeon: Taft Minerva, MD;  Location: [facility];  Service: Orthopedics;  Laterality: Right;  Removal Foreign Body Right Thumb   IR  FLUORO GUIDE CV LINE RIGHT  08/06/2018   IR FLUORO GUIDE CV LINE RIGHT  02/17/2023   IR US  GUIDE VASC ACCESS RIGHT  08/06/2018   IR US  GUIDE VASC ACCESS RIGHT  02/17/2023   KNEE ARTHROSCOPY  10/16/2007   left   KNEE ARTHROSCOPY WITH LATERAL MENISECTOMY Right 10/14/2015   Procedure: LEFT KNEE ARTHROSCOPY WITH PARTIAL LATERAL MENISECTOMY;  Surgeon: Taft BRAVO Minerva, MD;  Location: [facility];  Service: Orthopedics;  Laterality: Right;   LEFT HEART CATH AND CORONARY ANGIOGRAPHY N/A 07/25/2021   Procedure: LEFT HEART CATH AND CORONARY ANGIOGRAPHY;  Surgeon: Wonda Sharper, MD;  Location: [facility] INVASIVE CV LAB;  Service: Cardiovascular;  Laterality: N/A;   LEFT HEART CATH AND CORONARY ANGIOGRAPHY N/A 12/26/2021   Procedure: LEFT HEART CATH AND CORONARY ANGIOGRAPHY;  Surgeon: Mady Bruckner, MD;  Location: [facility] INVASIVE CV LAB;  Service: Cardiovascular;  Laterality: N/A;   LEFT HEART CATH AND CORONARY ANGIOGRAPHY N/A 01/20/2022   Procedure: LEFT HEART CATH AND CORONARY ANGIOGRAPHY;  Surgeon: Wonda Sharper, MD;  Location: [facility] INVASIVE CV LAB;  Service: Cardiovascular;  Laterality: N/A;   LEFT HEART CATHETERIZATION WITH CORONARY ANGIOGRAM N/A 03/28/2013   Procedure: LEFT HEART CATHETERIZATION WITH CORONARY ANGIOGRAM;  Surgeon: Alm LELON Clay, MD;  Location: [facility] CATH LAB;  Service: Cardiovascular;  Laterality: N/A;   NM MYOVIEW  LTD     PENILE PROSTHESIS IMPLANT N/A 08/16/2015   Procedure: PENILE PROTHESIS INFLATABLE, three piece, Excisional biopsy of Penile ulcer, Penile molding;  Surgeon: Arlena Gal, MD;  Location: [facility];  Service: Urology;  Laterality: N/A;   PENILE PROSTHESIS IMPLANT N/A 12/24/2017   Procedure: REMOVAL AND  REPLACEMENT  COLOPLAST PENILE PROSTHESIS;  Surgeon: Carolee Sherwood JONETTA DOUGLAS, MD;  Location: [facility];  Service: Urology;  Laterality: N/A;   PERIPHERAL VASCULAR BALLOON ANGIOPLASTY  02/28/2023   Procedure: PERIPHERAL VASCULAR BALLOON ANGIOPLASTY;  Surgeon: Lanis Fonda BRAVO, MD;  Location: [facility]  INVASIVE CV LAB;  Service: Cardiovascular;;   PORT-A-CATH REMOVAL N/A 02/19/2023   Procedure: REMOVAL OF PERITONEAL DIALYSIS CATHETER;  Surgeon: Lanis Fonda BRAVO, MD;  Location: [facility] OR;  Service: Vascular;  Laterality: N/A;   QUADRICEPS TENDON REPAIR  07/21/2011   Procedure: REPAIR QUADRICEP TENDON;  Surgeon: Taft Minerva, MD;  Location: [facility];  Service: Orthopedics;  Laterality: Right;   RIGHT/LEFT HEART CATH AND CORONARY ANGIOGRAPHY N/A 05/30/2022   Procedure: RIGHT/LEFT HEART CATH AND CORONARY ANGIOGRAPHY;  Surgeon: Dann Candyce RAMAN, MD;  Location: [facility] INVASIVE CV LAB;  Service: Cardiovascular;  Laterality: N/A;   TEE WITHOUT CARDIOVERSION N/A 06/05/2022   Procedure: TRANSESOPHAGEAL ECHOCARDIOGRAM (TEE);  Surgeon: Shyrl Linnie KIDD, MD;  Location: [facility] OR;  Service: Open Heart Surgery;  Laterality: N/A;   TOENAIL EXCISION     removed x2-bilateral   UMBILICAL HERNIA REPAIR  07/17/2005   [place]     HPI  from the history and physical done on the day of admission:     HPI: KENN REKOWSKI is a 73 y.o. male with medical history significant of end-stage renal disease on dialysis Monday/Wednesday/Friday, diabetes type 2, GERD, hypertension, COPD.  Patient presents with shortness of breath over the last couple of weeks that has been progressive.  He has had a couple rounds of antibiotics but has not improved.  He has been coughing which is a dry wheezy cough.  No sputum production.  Denies fevers or chills currently.  Here, he is needed to be on 4 to 5 L nasal cannula.  Chest x-ray shows interstitial marking and he tested positive for flu and RSV.   Review of Systems: As mentioned in the history of present illness. All other systems reviewed and are negative.    Hospital Course:     Brief Narrative:  73 y.o. male with medical history significant of end-stage renal disease on dialysis Monday/Wednesday/Friday, diabetes type 2, GERD, hypertension, COPD admitted on 08/24/2023 with acute hypoxic  respiratory failure secondary to RSV and influenza     -Assessment and Plan: 1)Influenza A and RSV bronchiolitis--- -Improved after treatment with Tamiflu , bronchodilators, mucolytic --post Ambulation O2 sats 96 to 97% on room  air -Okay to discharge on Tamiflu  -Prednisone  as prescribed    2)HTN--continue amlodipine  5 mg daily and Coreg    3)COPD exacerbation--due to #1 above -Improved IV steroids okay to discharge on prednisone  -No indication for further antibiotics--- suspect viral etiology as above #1   4)GERD--- continue nexium  especially while on steroids   5)ESRD--MWF schedule Last HD 08/24/23 per usual schedule -Appears euvolemic, continue HD per usual schedule   6) class II obesity and OSA--continue CPAP nightly -Low calorie diet, portion control and increase physical activity discussed with patient -Body mass index is 38.11 kg/m.   7)CAD h/o prior NSTEMI--chest pain-free  -continue atorvastatin , Coreg  and aspirin    8)DM2-hemoglobin A1c was 4.9 reflecting excellent diabetic control PTA -Continue diet and lifestyle modifications   Disposition: The patient is from: Home =--- Discharge home  Discharge Condition: Stable without further hypoxia  Follow UP   Follow-up Information     Carlette Benita Area, MD. Schedule an appointment as soon as possible for a visit in 1 week(s).   Specialty: Internal Medicine Contact information: [address] [place] [address] [phone]                  Diet and Activity recommendation:  As advised  Discharge Instructions   Discharge Instructions     Ambulatory Referral for Lung Cancer Scre   Complete by: As directed    Call MD for:  difficulty breathing, headache or visual disturbances   Complete by: As directed    Call MD for:  persistant dizziness or light-headedness   Complete by: As directed    Call MD for:  persistant nausea and vomiting   Complete by: As directed    Call MD for:   temperature >100.4   Complete by: As directed    Diet - low sodium heart healthy   Complete by: As directed    Diet Carb Modified   Complete by: As directed    Discharge instructions   Complete by: As directed    1)Very Low-salt diet advised---Less than 2 gm of Sodium per day advised----ok to use Mrs DASH salt substitute instead of Salt 2)Weigh yourself daily, call if you gain more than 3 pounds in 1 day or more than 5 pounds in 1 week as your hemodialysis schedule may need to be adjusted 3)Limit your Fluid  intake to no more than 50 ounces (1.5 Liters) per day 4) please note that there has been some changes to your medications 5) continue hemodialysis on Monday Wednesday Friday as per usual schedule   Increase activity slowly   Complete by: As directed          Discharge Medications     Allergies as of 08/26/2023       Reactions   Tramadol  Itching   Opana [oxymorphone Hcl] Itching        Medication List     STOP taking these medications    azithromycin  250 MG tablet Commonly known as: ZITHROMAX        TAKE these medications    albuterol  108 (90 Base) MCG/ACT inhaler Commonly known as: VENTOLIN  HFA Inhale 2 puffs into the lungs every 6 (six) hours as needed for wheezing or shortness of breath.   amLODipine  5 MG tablet Commonly known as: NORVASC  Take 1 tablet (5 mg total) by mouth daily.   ARANESP  IJ Inject as directed. Done at HD   ascorbic acid  500 MG tablet Commonly known as: VITAMIN C  Take 1 tablet (500 mg total) by  mouth daily.   aspirin  EC 81 MG tablet Take 81 mg by mouth daily. Swallow whole.   atorvastatin  80 MG tablet Commonly known as: LIPITOR  Take 1 tablet (80 mg total) by mouth daily.   carvedilol  6.25 MG tablet Commonly known as: COREG  Take 6.25 mg by mouth 2 (two) times daily with a meal.   esomeprazole  40 MG capsule Commonly known as: NEXIUM  Take 1 capsule (40 mg total) by mouth 2 (two) times daily before a meal.   fluticasone   furoate-vilanterol 200-25 MCG/ACT Aepb Commonly known as: BREO ELLIPTA  Use 1 (one) inhaltion once a day.   guaiFENesin  600 MG 12 hr tablet Commonly known as: MUCINEX  Take 1 tablet (600 mg total) by mouth 2 (two) times daily for 10 days.   hydrocortisone  2.5 % rectal cream Commonly known as: ANUSOL -HC Place 1 Application rectally 2 (two) times daily. What changed:  when to take this reasons to take this   oseltamivir  30 MG capsule Commonly known as: TAMIFLU  Take 1 capsule (30 mg total) by mouth every Monday, Wednesday, and Friday with hemodialysis for 5 days. Start taking on: August 27, 2023   predniSONE  20 MG tablet Commonly known as: DELTASONE  Take 2 tablets (40 mg total) by mouth daily with breakfast for 5 days.   promethazine -dextromethorphan  6.25-15 MG/5ML syrup Commonly known as: PROMETHAZINE -DM Take 1.25 mLs by mouth 4 (four) times daily as needed for cough.        Major procedures and Radiology Reports - PLEASE review detailed and final reports for all details, in brief -    DG Chest Port 1 View Result Date: 08/24/2023 CLINICAL DATA:  Sepsis EXAM: PORTABLE CHEST 1 VIEW COMPARISON:  Chest x-ray 08/14/2023 FINDINGS: The heart is enlarged. There are increased central interstitial markings bilaterally. There is no focal lung infiltrate, pleural effusion or pneumothorax. No acute fractures are seen. IMPRESSION: Cardiomegaly with increased central interstitial markings bilaterally, which may represent pulmonary edema. Electronically Signed   By: Greig Pique M.D.   On: 08/24/2023 16:56   DG Chest 2 View Result Date: 08/16/2023 CLINICAL DATA:  Cough. EXAM: CHEST - 2 VIEW COMPARISON:  05/23/2023 FINDINGS: Prior median sternotomy and CABG. Cardiomegaly is stable. There is fluid in the fissures without large subpulmonic effusion. Bronchial and interstitial thickening may be bronchitic or congestive. No confluent airspace disease. No pneumothorax. IMPRESSION: 1. Cardiomegaly post  CABG. 2. Bronchial and interstitial thickening may be bronchitic or congestive. Trace fluid in the fissures without large subpulmonic effusion. Electronically Signed   By: Andrea Gasman M.D.   On: 08/16/2023 11:54    Micro Results  Recent Results (from the past 240 hours)  Resp panel by RT-PCR (RSV, Flu A&B, Covid) Anterior Nasal Swab     Status: Abnormal   Collection Time: 08/24/23  2:50 PM   Specimen: Anterior Nasal Swab  Result Value Ref Range Status   SARS Coronavirus 2 by RT PCR NEGATIVE NEGATIVE Final    Comment: (NOTE) SARS-CoV-2 target nucleic acids are NOT DETECTED.  The SARS-CoV-2 RNA is generally detectable in upper respiratory specimens during the acute phase of infection. The lowest concentration of SARS-CoV-2 viral copies this assay can detect is 138 copies/mL. A negative result does not preclude SARS-Cov-2 infection and should not be used as the sole basis for treatment or other patient management decisions. A negative result may occur with  improper specimen collection/handling, submission of specimen other than nasopharyngeal swab, presence of viral mutation(s) within the areas targeted by this assay, and inadequate number of  viral copies(<138 copies/mL). A negative result must be combined with clinical observations, patient history, and epidemiological information. The expected result is Negative.  Fact Sheet for Patients:  bloggercourse.com  Fact Sheet for Healthcare Providers:  seriousbroker.it  This test is no t yet approved or cleared by the [place]  FDA and  has been authorized for detection and/or diagnosis of SARS-CoV-2 by FDA under an Emergency Use Authorization (EUA). This EUA will remain  in effect (meaning this test can be used) for the duration of the COVID-19 declaration under Section 564(b)(1) of the Act, 21 U.S.C.section 360bbb-3(b)(1), unless the authorization is terminated  or revoked  sooner.       Influenza A by PCR POSITIVE (A) NEGATIVE Final   Influenza B by PCR NEGATIVE NEGATIVE Final    Comment: (NOTE) The Xpert Xpress SARS-CoV-2/FLU/RSV plus assay is intended as an aid in the diagnosis of influenza from Nasopharyngeal swab specimens and should not be used as a sole basis for treatment. Nasal washings and aspirates are unacceptable for Xpert Xpress SARS-CoV-2/FLU/RSV testing.  Fact Sheet for Patients: bloggercourse.com  Fact Sheet for Healthcare Providers: seriousbroker.it  This test is not yet approved or cleared by the [place]  FDA and has been authorized for detection and/or diagnosis of SARS-CoV-2 by FDA under an Emergency Use Authorization (EUA). This EUA will remain in effect (meaning this test can be used) for the duration of the COVID-19 declaration under Section 564(b)(1) of the Act, 21 U.S.C. section 360bbb-3(b)(1), unless the authorization is terminated or revoked.     Resp Syncytial Virus by PCR POSITIVE (A) NEGATIVE Final    Comment: (NOTE) Fact Sheet for Patients: bloggercourse.com  Fact Sheet for Healthcare Providers: seriousbroker.it  This test is not yet approved or cleared by the [place]  FDA and has been authorized for detection and/or diagnosis of SARS-CoV-2 by FDA under an Emergency Use Authorization (EUA). This EUA will remain in effect (meaning this test can be used) for the duration of the COVID-19 declaration under Section 564(b)(1) of the Act, 21 U.S.C. section 360bbb-3(b)(1), unless the authorization is terminated or revoked.  Performed at [facility], [address], [place], [place] [address]   Culture, blood (Routine x 2)     Status: None (Preliminary result)   Collection Time: 08/24/23  3:29 PM   Specimen: BLOOD  Result Value Ref Range Status   Specimen Description BLOOD BLOOD LEFT HAND  Final   Special  Requests   Final    BOTTLES DRAWN AEROBIC AND ANAEROBIC Blood Culture adequate volume   Culture   Final    NO GROWTH 2 DAYS Performed at [facility], [address] [place], [place] [address]    Report Status PENDING  Incomplete  Culture, blood (Routine x 2)     Status: None (Preliminary result)   Collection Time: 08/24/23  3:29 PM   Specimen: BLOOD  Result Value Ref Range Status   Specimen Description BLOOD BLOOD LEFT ARM  Final   Special Requests   Final    BOTTLES DRAWN AEROBIC AND ANAEROBIC Blood Culture adequate volume   Culture   Final    NO GROWTH 2 DAYS Performed at [facility], [address], [place], [place] [address]    Report Status PENDING  Incomplete    Today   Subjective    Izeah Vossler today has no new complaints -Ambulating the hallways and post ambulation O2 sats on room air is 96 to 97% No fever  Or chills  Cough and dyspnea  has improved significantly          Patient has been seen and examined prior to discharge   Objective   Blood pressure 115/88, pulse (!) 51, temperature (!) 97.5 F (36.4 C), temperature source Oral, resp. rate 20, height 5' 4 (1.626 m), weight 100.7 kg, SpO2 97%.   Intake/Output Summary (Last 24 hours) at 08/26/2023 1314 Last data filed at 08/26/2023 0920 Gross per 24 hour  Intake 490 ml  Output --  Net 490 ml    Exam Gen:- Awake Alert, no acute distress , no conversational dyspnea HEENT:- [place].AT, No sclera icterus Neck-Supple Neck,No JVD,.  Lungs-improved air movement, no wheezing CV- S1, S2 normal, regular Abd-  +ve B.Sounds, Abd Soft, No tenderness,    Extremity/Skin:- +ve  edema,   good pulses Psych-affect is appropriate, oriented x3 Neuro-no new focal deficits, no tremors  MSK-left upper extremity fistula with bruit and thrill    Data Review   CBC w Diff:  Lab Results  Component Value Date   WBC 4.0 08/25/2023   HGB 8.0 (L) 08/25/2023   HGB 11.3 (L) 12/22/2021   HCT 27.1 (L) 08/25/2023   HCT 33.7 (L)  12/22/2021   PLT 171 08/25/2023   PLT 117 (L) 12/22/2021   LYMPHOPCT 7 08/24/2023   BANDSPCT 5 02/15/2023   MONOPCT 11 08/24/2023   EOSPCT 3 08/24/2023   BASOPCT 1 08/24/2023    CMP:  Lab Results  Component Value Date   NA 137 08/25/2023   NA 144 05/16/2023   K 4.8 08/25/2023   CL 98 08/25/2023   CO2 25 08/25/2023   BUN 24 (H) 08/25/2023   BUN 40 (H) 05/16/2023   CREATININE 6.03 (H) 08/25/2023   CREATININE 18.89 (H) 02/09/2020   PROT 7.9 08/24/2023   PROT 6.5 05/16/2023   ALBUMIN  3.1 (L) 08/24/2023   ALBUMIN  3.7 (L) 05/16/2023   BILITOT 0.9 08/24/2023   BILITOT 0.6 05/16/2023   ALKPHOS 70 08/24/2023   AST 28 08/24/2023   ALT 22 08/24/2023  .  Total Discharge time is about 33 minutes  Rendall Carwin M.D on 08/26/2023 at 1:14 PM  Go to [email or web address] -  for contact info  [facility] - Office  [phone]

## 2023-08-27 ENCOUNTER — Telehealth: Payer: Self-pay

## 2023-08-27 DIAGNOSIS — N250 Renal osteodystrophy: Secondary | ICD-10-CM | POA: Diagnosis not present

## 2023-08-27 DIAGNOSIS — Z992 Dependence on renal dialysis: Secondary | ICD-10-CM | POA: Diagnosis not present

## 2023-08-27 DIAGNOSIS — D631 Anemia in chronic kidney disease: Secondary | ICD-10-CM | POA: Diagnosis not present

## 2023-08-27 DIAGNOSIS — N2581 Secondary hyperparathyroidism of renal origin: Secondary | ICD-10-CM | POA: Diagnosis not present

## 2023-08-27 DIAGNOSIS — D509 Iron deficiency anemia, unspecified: Secondary | ICD-10-CM | POA: Diagnosis not present

## 2023-08-27 DIAGNOSIS — N186 End stage renal disease: Secondary | ICD-10-CM | POA: Diagnosis not present

## 2023-08-27 NOTE — Transitions of Care (Post Inpatient/ED Visit) (Signed)
   08/27/2023  Name: KHOA BERLING MRN: [id number] DOB: [birth year]  Today's TOC FU Call Status: Today's TOC FU Call Status:: Unsuccessful Call (1st Attempt) Unsuccessful Call (1st Attempt) Date: 08/27/23  Attempted to reach the patient regarding the most recent Inpatient/ED visit. Patient was called in an Outreach attempt to offer VBCI  30-day TOC program. Pt is eligible for program due to potential risk for readmission and/or high utilization. Unfortunately, I was not able to speak with the patient in regards to recent hospital discharge.Unable to LM no voice mail     Follow Up Plan: Additional outreach attempts will be made to reach the patient to complete the Transitions of Care (Post Inpatient/ED visit) call.   James Mcardle , BSN, RN [facility]   VBCI-Population Health RN Care Manager Direct Dial  [phone]  Fax: [phone] Website: [email or web address]

## 2023-08-29 ENCOUNTER — Telehealth: Payer: Self-pay

## 2023-08-29 DIAGNOSIS — Z992 Dependence on renal dialysis: Secondary | ICD-10-CM | POA: Diagnosis not present

## 2023-08-29 DIAGNOSIS — N186 End stage renal disease: Secondary | ICD-10-CM | POA: Diagnosis not present

## 2023-08-29 DIAGNOSIS — N250 Renal osteodystrophy: Secondary | ICD-10-CM | POA: Diagnosis not present

## 2023-08-29 DIAGNOSIS — D631 Anemia in chronic kidney disease: Secondary | ICD-10-CM | POA: Diagnosis not present

## 2023-08-29 DIAGNOSIS — D509 Iron deficiency anemia, unspecified: Secondary | ICD-10-CM | POA: Diagnosis not present

## 2023-08-29 DIAGNOSIS — N2581 Secondary hyperparathyroidism of renal origin: Secondary | ICD-10-CM | POA: Diagnosis not present

## 2023-08-29 LAB — CULTURE, BLOOD (ROUTINE X 2)
Culture: NO GROWTH
Culture: NO GROWTH
Special Requests: ADEQUATE
Special Requests: ADEQUATE

## 2023-08-29 NOTE — Transitions of Care (Post Inpatient/ED Visit) (Signed)
   08/29/2023  Name: ANSON PEDDIE MRN: [id number] DOB: 1951/07/12  Today's TOC FU Call Status: Today's TOC FU Call Status:: Unsuccessful Call (2nd Attempt) Unsuccessful Call (1st Attempt) Date: 08/27/23 Unsuccessful Call (2nd Attempt) Date: 08/29/23  Attempted to reach the patient regarding the most recent Inpatient/ED visit. Patient was called in an Outreach attempt to offer VBCI  30-day TOC program. Pt is eligible for program due to potential risk for readmission and/or high utilization. Unfortunately, I was not able to speak with the patient in regards to recent hospital discharge     Patient's voicemail has  a generic greeting. To maintain HIPAA compliance, left message with VBCI CM contact information only  and a request for a call back .   Follow Up Plan: Additional outreach attempts will be made to reach the patient to complete the Transitions of Care (Post Inpatient/ED visit) call.   Susa Loffler , BSN, RN [facility]   VBCI-Population Health RN Care Manager Direct Dial [phone]  Fax: [phone] Website: [email or web address]

## 2023-08-30 ENCOUNTER — Telehealth: Payer: Self-pay

## 2023-08-30 DIAGNOSIS — E1165 Type 2 diabetes mellitus with hyperglycemia: Secondary | ICD-10-CM | POA: Diagnosis not present

## 2023-08-30 DIAGNOSIS — R0902 Hypoxemia: Secondary | ICD-10-CM | POA: Diagnosis not present

## 2023-08-30 DIAGNOSIS — J9601 Acute respiratory failure with hypoxia: Secondary | ICD-10-CM | POA: Diagnosis not present

## 2023-08-30 DIAGNOSIS — N186 End stage renal disease: Secondary | ICD-10-CM | POA: Diagnosis not present

## 2023-08-30 DIAGNOSIS — B338 Other specified viral diseases: Secondary | ICD-10-CM | POA: Diagnosis not present

## 2023-08-30 NOTE — Transitions of Care (Post Inpatient/ED Visit) (Signed)
   08/30/2023  Name: [name] [name] MRN: [id number] DOB: Feb 25, 1951  Today's TOC FU Call Status: Today's TOC FU Call Status:: Unsuccessful Call (3rd Attempt) Unsuccessful Call (1st Attempt) Date: 08/27/23 Unsuccessful Call (2nd Attempt) Date: 08/29/23 Unsuccessful Call (3rd Attempt) Date: 08/30/23  Attempted to reach the patient regarding the most recent Inpatient/ED visit.  Patient was called in an Outreach attempt to offer VBCI  30-day TOC program. Pt is eligible for program due to potential risk for readmission and/or high utilization. Unfortunately, I was not able to speak with the patient in regards to recent hospital discharge    Patient's voicemail has  a generic greeting. To maintain HIPAA compliance, left message with VBCI CM contact information only  and a request for a call back .      Based on the VBCI program guidelines, if  we are unable to reach the patient  after 3 attempts, no additional outreach attempts will be made and the TOC follow-up will be closed Unfortunately, we have been unable to make contact with the patient for follow up.   .   Follow Up Plan: No further outreach attempts will be made at this time. We have been unable to contact the patient.  Susa Loffler , BSN, RN [facility]   VBCI-Population Health RN Care Manager Direct Dial [phone]  Fax: [phone] Website: [email or web address]

## 2023-08-31 DIAGNOSIS — D631 Anemia in chronic kidney disease: Secondary | ICD-10-CM | POA: Diagnosis not present

## 2023-08-31 DIAGNOSIS — N250 Renal osteodystrophy: Secondary | ICD-10-CM | POA: Diagnosis not present

## 2023-08-31 DIAGNOSIS — N2581 Secondary hyperparathyroidism of renal origin: Secondary | ICD-10-CM | POA: Diagnosis not present

## 2023-08-31 DIAGNOSIS — D509 Iron deficiency anemia, unspecified: Secondary | ICD-10-CM | POA: Diagnosis not present

## 2023-08-31 DIAGNOSIS — Z992 Dependence on renal dialysis: Secondary | ICD-10-CM | POA: Diagnosis not present

## 2023-08-31 DIAGNOSIS — N186 End stage renal disease: Secondary | ICD-10-CM | POA: Diagnosis not present

## 2023-09-03 DIAGNOSIS — N250 Renal osteodystrophy: Secondary | ICD-10-CM | POA: Diagnosis not present

## 2023-09-03 DIAGNOSIS — D509 Iron deficiency anemia, unspecified: Secondary | ICD-10-CM | POA: Diagnosis not present

## 2023-09-03 DIAGNOSIS — D631 Anemia in chronic kidney disease: Secondary | ICD-10-CM | POA: Diagnosis not present

## 2023-09-03 DIAGNOSIS — N186 End stage renal disease: Secondary | ICD-10-CM | POA: Diagnosis not present

## 2023-09-03 DIAGNOSIS — N2581 Secondary hyperparathyroidism of renal origin: Secondary | ICD-10-CM | POA: Diagnosis not present

## 2023-09-03 DIAGNOSIS — Z992 Dependence on renal dialysis: Secondary | ICD-10-CM | POA: Diagnosis not present

## 2023-09-05 ENCOUNTER — Ambulatory Visit: Payer: Medicare Other | Admitting: Primary Care

## 2023-09-05 DIAGNOSIS — D631 Anemia in chronic kidney disease: Secondary | ICD-10-CM | POA: Diagnosis not present

## 2023-09-05 DIAGNOSIS — N186 End stage renal disease: Secondary | ICD-10-CM | POA: Diagnosis not present

## 2023-09-05 DIAGNOSIS — D509 Iron deficiency anemia, unspecified: Secondary | ICD-10-CM | POA: Diagnosis not present

## 2023-09-05 DIAGNOSIS — N250 Renal osteodystrophy: Secondary | ICD-10-CM | POA: Diagnosis not present

## 2023-09-05 DIAGNOSIS — N2581 Secondary hyperparathyroidism of renal origin: Secondary | ICD-10-CM | POA: Diagnosis not present

## 2023-09-05 DIAGNOSIS — Z992 Dependence on renal dialysis: Secondary | ICD-10-CM | POA: Diagnosis not present

## 2023-09-07 DIAGNOSIS — D631 Anemia in chronic kidney disease: Secondary | ICD-10-CM | POA: Diagnosis not present

## 2023-09-07 DIAGNOSIS — D509 Iron deficiency anemia, unspecified: Secondary | ICD-10-CM | POA: Diagnosis not present

## 2023-09-07 DIAGNOSIS — N2581 Secondary hyperparathyroidism of renal origin: Secondary | ICD-10-CM | POA: Diagnosis not present

## 2023-09-07 DIAGNOSIS — Z992 Dependence on renal dialysis: Secondary | ICD-10-CM | POA: Diagnosis not present

## 2023-09-07 DIAGNOSIS — N186 End stage renal disease: Secondary | ICD-10-CM | POA: Diagnosis not present

## 2023-09-07 DIAGNOSIS — N250 Renal osteodystrophy: Secondary | ICD-10-CM | POA: Diagnosis not present

## 2023-09-10 DIAGNOSIS — D631 Anemia in chronic kidney disease: Secondary | ICD-10-CM | POA: Diagnosis not present

## 2023-09-10 DIAGNOSIS — D509 Iron deficiency anemia, unspecified: Secondary | ICD-10-CM | POA: Diagnosis not present

## 2023-09-10 DIAGNOSIS — N250 Renal osteodystrophy: Secondary | ICD-10-CM | POA: Diagnosis not present

## 2023-09-10 DIAGNOSIS — N2581 Secondary hyperparathyroidism of renal origin: Secondary | ICD-10-CM | POA: Diagnosis not present

## 2023-09-10 DIAGNOSIS — N186 End stage renal disease: Secondary | ICD-10-CM | POA: Diagnosis not present

## 2023-09-10 DIAGNOSIS — Z992 Dependence on renal dialysis: Secondary | ICD-10-CM | POA: Diagnosis not present

## 2023-09-12 DIAGNOSIS — N250 Renal osteodystrophy: Secondary | ICD-10-CM | POA: Diagnosis not present

## 2023-09-12 DIAGNOSIS — N186 End stage renal disease: Secondary | ICD-10-CM | POA: Diagnosis not present

## 2023-09-12 DIAGNOSIS — Z992 Dependence on renal dialysis: Secondary | ICD-10-CM | POA: Diagnosis not present

## 2023-09-12 DIAGNOSIS — D631 Anemia in chronic kidney disease: Secondary | ICD-10-CM | POA: Diagnosis not present

## 2023-09-12 DIAGNOSIS — N2581 Secondary hyperparathyroidism of renal origin: Secondary | ICD-10-CM | POA: Diagnosis not present

## 2023-09-12 DIAGNOSIS — D509 Iron deficiency anemia, unspecified: Secondary | ICD-10-CM | POA: Diagnosis not present

## 2023-09-13 ENCOUNTER — Ambulatory Visit: Payer: Medicare Other | Admitting: Gastroenterology

## 2023-09-13 ENCOUNTER — Telehealth: Payer: Self-pay | Admitting: *Deleted

## 2023-09-13 ENCOUNTER — Encounter: Payer: Self-pay | Admitting: Gastroenterology

## 2023-09-13 VITALS — BP 116/60 | HR 71 | Temp 98.0°F | Ht 64.0 in | Wt 218.8 lb

## 2023-09-13 DIAGNOSIS — K760 Fatty (change of) liver, not elsewhere classified: Secondary | ICD-10-CM

## 2023-09-13 DIAGNOSIS — R140 Abdominal distension (gaseous): Secondary | ICD-10-CM | POA: Diagnosis not present

## 2023-09-13 DIAGNOSIS — K7469 Other cirrhosis of liver: Secondary | ICD-10-CM

## 2023-09-13 NOTE — Patient Instructions (Signed)
 We have ordered routine labs.   We have also ordered an ultrasound of your liver in the near future.  You are not immune to Hepatitis A, so you will still need that vaccination.   We will see you in 6 months!   I enjoyed seeing you again today! I value our relationship and want to provide genuine, compassionate, and quality care. You may receive a survey regarding your visit with me, and I welcome your feedback! Thanks so much for taking the time to complete this. I look forward to seeing you again.      Gelene Mink, PhD, ANP-BC [facility]

## 2023-09-13 NOTE — Progress Notes (Signed)
 Gastroenterology Office Note     Primary Care Physician:  Benetta Spar, MD  Primary Gastroenterologist:   Chief Complaint   Chief Complaint  Patient presents with   Follow-up     History of Present Illness   [name] [name] is a 73 y.o. male presenting today with a history of GERD, anemia of chronic disease, with symptomatic hemorrhoids. Previously on peritoneal dialysis but now on dialysis, concerns for cirrhosis on CT and [place], possible 3 mm gallbladder polyp.    Completed Hep B vaccination. Needs Hep A vaccination.    Acute hepatitis panel negative. Off PD, now on HD. LFTs normal.    Hep C negative Sept 2024.  Due for RUQ [place], MELD labs  Seems like abdomen is getting big. When doing dialysis, stomach is better.   Needs EGD for variceal screening? Needed pulmonary clearance first.   Hyperplastic polyps. Colonoscopy 2026.    Past Medical History:  Diagnosis Date   Anemia    Arthritis    Asthma    Bell palsy    CAD (coronary artery disease)    a. 2014 MV: abnl w/ infap ischemia; b. 03/2013 Cath: aneurysmal bleb in the LAD w/ otw nonobs dzs-->Med Rx.   Chronic back pain    Chronic knee pain    a. 09/2015 s/p R TKA.   Chronic pain    Chronic shoulder pain    Chronic sinusitis    COPD (chronic obstructive pulmonary disease) (HCC)    Diabetes mellitus without complication (HCC)    type II    ESRD on peritoneal dialysis (HCC)    on peritoneal dialysis, [facility]   Essential hypertension    GERD (gastroesophageal reflux disease)    Gout    Hepatomegaly    noted on noncontrast CT 2015   History of hiatal hernia    Hyperlipidemia    Lateral meniscus tear    Obesity    Truncal   Obstructive sleep apnea    does not use cpap    On home oxygen therapy    uses 2l when is going somewhere per patient    PUD (peptic ulcer disease)    remote, reports f/u EGD about 8 years ago unremarkable    Reactive airway disease    related to exposure to  chemical during 9/11   Sinusitis    Vitamin D deficiency     Past Surgical History:  Procedure Laterality Date   A/V FISTULAGRAM N/A 02/28/2023   Procedure: A/V Fistulagram;  Surgeon: Victorino Sparrow, MD;  Location: [facility] INVASIVE CV LAB;  Service: Cardiovascular;  Laterality: N/A;   ASAD LT SHOULDER  12/15/2008   left shoulder   AV FISTULA PLACEMENT Left 08/09/2016   Procedure: BRACHIOCEPHALIC ARTERIOVENOUS (AV) FISTULA CREATION LEFT ARM;  Surgeon: Sherren Kerns, MD;  Location: [facility] OR;  Service: Vascular;  Laterality: Left;   CAPD INSERTION N/A 10/07/2018   Procedure: LAPAROSCOPIC PERITONEAL CATHETER PLACEMENT;  Surgeon: Rodman Pickle, MD;  Location: [facility] ORS;  Service: General;  Laterality: N/A;   CATARACT EXTRACTION W/PHACO Left 03/28/2016   Procedure: CATARACT EXTRACTION PHACO AND INTRAOCULAR LENS PLACEMENT LEFT EYE;  Surgeon: Jethro Bolus, MD;  Location: [facility] ORS;  Service: Ophthalmology;  Laterality: Left;  CDE: 4.77   CATARACT EXTRACTION W/PHACO Right 04/11/2016   Procedure: CATARACT EXTRACTION PHACO AND INTRAOCULAR LENS PLACEMENT RIGHT EYE; CDE:  4.74;  Surgeon: Jethro Bolus, MD;  Location: [facility] ORS;  Service: Ophthalmology;  Laterality: Right;   COLONOSCOPY  10/15/2008   Fields: Rectal polyp obliterated, not retrieved, hemorrhoids, single ascending colon diverticulum near the CV. Next colonoscopy April 2020   COLONOSCOPY N/A 12/25/2014   SLF: 1. Colorectal polyps (2) removed 2. Small internal hemorrhoids 3. the left colon is severely redundant. hyperplastic polyps   CORONARY ARTERY BYPASS GRAFT N/A 06/05/2022   Procedure: OFF PUMP CORONARY ARTERY BYPASS GRAFTING (CABG) X 2 BYPASSES USING LEFT INTERNAL MAMMARY ARTERY AND RIGHT LEG GREATER SAPHENOUS VEIN HARVESTED ENDOSCOPICALLY;  Surgeon: Corliss Skains, MD;  Location: [facility] OR;  Service: Open Heart Surgery;  Laterality: N/A;   CORONARY STENT INTERVENTION N/A 07/25/2021   Procedure: CORONARY STENT INTERVENTION;  Surgeon: Tonny Bollman, MD;  Location: [facility] INVASIVE CV LAB;  Service: Cardiovascular;  Laterality: N/A;   CORONARY STENT INTERVENTION N/A 12/26/2021   Procedure: CORONARY STENT INTERVENTION;  Surgeon: Yvonne Kendall, MD;  Location: [facility] INVASIVE CV LAB;  Service: Cardiovascular;  Laterality: N/A;   CORONARY STENT INTERVENTION N/A 01/20/2022   Procedure: CORONARY STENT INTERVENTION;  Surgeon: Tonny Bollman, MD;  Location: [facility] INVASIVE CV LAB;  Service: Cardiovascular;  Laterality: N/A;   DOPPLER ECHOCARDIOGRAPHY     ESOPHAGOGASTRODUODENOSCOPY N/A 12/25/2014   SLF: 1. Anemia most likely due to CRI, gastritis, gastric polyps 2. Moderate non-erosive gastriits and mild duodenitis.  3.TWo large gstric polyps removed.    EYE SURGERY  12/22/2010   tear duct probing-[place]   FOREIGN BODY REMOVAL  03/29/2011   Procedure: REMOVAL FOREIGN BODY EXTREMITY;  Surgeon: Fuller Canada, MD;  Location: [facility];  Service: Orthopedics;  Laterality: Right;  Removal Foreign Body Right Thumb   IR FLUORO GUIDE CV LINE RIGHT  08/06/2018   IR FLUORO GUIDE CV LINE RIGHT  02/17/2023   IR US GUIDE VASC ACCESS RIGHT  08/06/2018   IR US GUIDE VASC ACCESS RIGHT  02/17/2023   KNEE ARTHROSCOPY  10/16/2007   left   KNEE ARTHROSCOPY WITH LATERAL MENISECTOMY Right 10/14/2015   Procedure: LEFT KNEE ARTHROSCOPY WITH PARTIAL LATERAL MENISECTOMY;  Surgeon: Vickki Hearing, MD;  Location: [facility];  Service: Orthopedics;  Laterality: Right;   LEFT HEART CATH AND CORONARY ANGIOGRAPHY N/A 07/25/2021   Procedure: LEFT HEART CATH AND CORONARY ANGIOGRAPHY;  Surgeon: Tonny Bollman, MD;  Location: [facility] INVASIVE CV LAB;  Service: Cardiovascular;  Laterality: N/A;   LEFT HEART CATH AND CORONARY ANGIOGRAPHY N/A 12/26/2021   Procedure: LEFT HEART CATH AND CORONARY ANGIOGRAPHY;  Surgeon: Yvonne Kendall, MD;  Location: [facility] INVASIVE CV LAB;  Service: Cardiovascular;  Laterality: N/A;   LEFT HEART CATH AND CORONARY ANGIOGRAPHY N/A 01/20/2022   Procedure: LEFT HEART CATH  AND CORONARY ANGIOGRAPHY;  Surgeon: Tonny Bollman, MD;  Location: [facility] INVASIVE CV LAB;  Service: Cardiovascular;  Laterality: N/A;   LEFT HEART CATHETERIZATION WITH CORONARY ANGIOGRAM N/A 03/28/2013   Procedure: LEFT HEART CATHETERIZATION WITH CORONARY ANGIOGRAM;  Surgeon: Marykay Lex, MD;  Location: [facility] CATH LAB;  Service: Cardiovascular;  Laterality: N/A;   NM MYOVIEW LTD     PENILE PROSTHESIS IMPLANT N/A 08/16/2015   Procedure: PENILE PROTHESIS INFLATABLE, three piece, Excisional biopsy of Penile ulcer, Penile molding;  Surgeon: Jethro Bolus, MD;  Location: [facility];  Service: Urology;  Laterality: N/A;   PENILE PROSTHESIS IMPLANT N/A 12/24/2017   Procedure: REMOVAL AND  REPLACEMENT  COLOPLAST PENILE PROSTHESIS;  Surgeon: Crista Elliot, MD;  Location: [facility];  Service: Urology;  Laterality: N/A;   PERIPHERAL VASCULAR BALLOON ANGIOPLASTY  02/28/2023   Procedure: PERIPHERAL VASCULAR BALLOON ANGIOPLASTY;  Surgeon: Karin Lieu,  Clementeen Graham, MD;  Location: [facility] INVASIVE CV LAB;  Service: Cardiovascular;;   PORT-A-CATH REMOVAL N/A 02/19/2023   Procedure: REMOVAL OF PERITONEAL DIALYSIS CATHETER;  Surgeon: Victorino Sparrow, MD;  Location: [facility] OR;  Service: Vascular;  Laterality: N/A;   QUADRICEPS TENDON REPAIR  07/21/2011   Procedure: REPAIR QUADRICEP TENDON;  Surgeon: Fuller Canada, MD;  Location: [facility];  Service: Orthopedics;  Laterality: Right;   RIGHT/LEFT HEART CATH AND CORONARY ANGIOGRAPHY N/A 05/30/2022   Procedure: RIGHT/LEFT HEART CATH AND CORONARY ANGIOGRAPHY;  Surgeon: Corky Crafts, MD;  Location: [facility] INVASIVE CV LAB;  Service: Cardiovascular;  Laterality: N/A;   TEE WITHOUT CARDIOVERSION N/A 06/05/2022   Procedure: TRANSESOPHAGEAL ECHOCARDIOGRAM (TEE);  Surgeon: Corliss Skains, MD;  Location: [facility] OR;  Service: Open Heart Surgery;  Laterality: N/A;   TOENAIL EXCISION     removed x2-bilateral   UMBILICAL HERNIA REPAIR  07/17/2005   [place]    Current Outpatient Medications   Medication Sig Dispense Refill   albuterol (VENTOLIN HFA) 108 (90 Base) MCG/ACT inhaler Inhale 2 puffs into the lungs every 6 (six) hours as needed for wheezing or shortness of breath. 18 g 11   amLODipine (NORVASC) 5 MG tablet Take 1 tablet (5 mg total) by mouth daily. 30 tablet 5   ascorbic acid (VITAMIN C) 500 MG tablet Take 1 tablet (500 mg total) by mouth daily. 100 tablet 0   aspirin EC 81 MG tablet Take 81 mg by mouth daily. Swallow whole.     atorvastatin (LIPITOR) 80 MG tablet Take 1 tablet (80 mg total) by mouth daily. 30 tablet 1   carvedilol (COREG) 6.25 MG tablet Take 6.25 mg by mouth 2 (two) times daily with a meal.     esomeprazole (NEXIUM) 40 MG capsule Take 1 capsule (40 mg total) by mouth 2 (two) times daily before a meal. 180 capsule 0   fluticasone furoate-vilanterol (BREO ELLIPTA) 200-25 MCG/ACT AEPB Use 1 (one) inhaltion once a day. 60 each 5   hydrocortisone (ANUSOL-HC) 2.5 % rectal cream Place 1 Application rectally 2 (two) times daily. (Patient taking differently: Place 1 Application rectally as needed for hemorrhoids or anal itching.) 30 g 1   No current facility-administered medications for this visit.    Allergies as of 09/13/2023 - Review Complete 09/13/2023  Allergen Reaction Noted   Tramadol Itching 07/10/2011   Opana [oxymorphone hcl] Itching 01/15/2014    Family History  Problem Relation Age of Onset   Hypertension Mother        MI   Cancer Mother        breast    Diabetes Mother    Diabetes Father    Hypertension Father    Hypertension Sister    Diabetes Sister    Arthritis Other    Asthma Other    Lung disease Other    Anesthesia problems Neg Hx    Hypotension Neg Hx    Malignant hyperthermia Neg Hx    Pseudochol deficiency Neg Hx    Colon cancer Neg Hx     Social History   Socioeconomic History   Marital status: Married    Spouse name: Not on file   Number of children: 2   Years of education: 12th grade   Highest education level:  Not on file  Occupational History   Occupation: disabled   Occupation:      Associate Professor: UNEMPLOYED  Tobacco Use   Smoking status: Former    Current packs/day: 0.00  Average packs/day: 1 pack/day for 25.0 years (25.0 ttl pk-yrs)    Types: Cigarettes    Start date: 03/27/1985    Quit date: 03/27/2010    Years since quitting: 13.4   Smokeless tobacco: Never   Tobacco comments:    Quit x 7 years  Vaping Use   Vaping status: Never Used  Substance and Sexual Activity   Alcohol use: Not Currently    Comment: occasionally   Drug use: Yes    Types: Marijuana    Comment: cocaine- last time used- 11/24/2017 , marijuana-    Sexual activity: Yes  Other Topics Concern   Not on file  Social History Narrative   He quit smoking in 2010. 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 He developed pulmonary problems, became disabled because of lower airway disease in 2009.       WATCHES BASKETBALL. HIS TEAM IS [place].   Social Drivers of Corporate investment banker Strain: Low Risk  (03/05/2023)   Received from [facility]   Overall Financial Resource Strain (CARDIA)    Difficulty of Paying Living Expenses: Not hard at all  Food Insecurity: No Food Insecurity (08/25/2023)   Hunger Vital Sign    Worried About Running Out of Food in the Last Year: Never true    Ran Out of Food in the Last Year: Never true  Transportation Needs: No Transportation Needs (08/25/2023)   PRAPARE - Administrator, Civil Service (Medical): No    Lack of Transportation (Non-Medical): No  Physical Activity: Sufficiently Active (12/29/2021)   Exercise Vital Sign    Days of Exercise per Week: 3 days    Minutes of Exercise per Session: 60 min  Stress: Patient Declined (03/12/2023)   Received from [facility]   [employer] of Occupational Health - Occupational Stress Questionnaire    Feeling of Stress : Patient declined  Social Connections: Socially Integrated  (08/25/2023)   Social Connection and Isolation Panel [NHANES]    Frequency of Communication with Friends and Family: More than three times a week    Frequency of Social Gatherings with Friends and Family: More than three times a week    Attends Religious Services: More than 4 times per year    Active Member of [employer] or Organizations: Yes    Attends Engineer, structural: More than 4 times per year    Marital Status: Married  Catering manager Violence: Not At Risk (08/25/2023)   Humiliation, Afraid, Rape, and Kick questionnaire    Fear of Current or Ex-Partner: No    Emotionally Abused: No    Physically Abused: No    Sexually Abused: No     Review of Systems   Gen: Denies any fever, chills, fatigue, weight loss, lack of appetite.  CV: Denies chest pain, heart palpitations, peripheral edema, syncope.  Resp: Denies shortness of breath at rest or with exertion. Denies wheezing or cough.  GI: Denies dysphagia or odynophagia. Denies jaundice, hematemesis, fecal incontinence. GU : Denies urinary burning, urinary frequency, urinary hesitancy MS: Denies joint pain, muscle weakness, cramps, or limitation of movement.  Derm: Denies rash, itching, dry skin Psych: Denies depression, anxiety, memory loss, and confusion Heme: Denies bruising, bleeding, and enlarged lymph nodes.   Physical Exam   BP 116/60 (BP Location: Right Arm, Patient Position: Sitting, Cuff Size: Large)   Pulse 71   Temp 98 F (36.7 C) (Oral)   Ht 5\' 4"  (1.626 m)  Wt 218 lb 12.8 oz (99.2 kg)   SpO2 96%   BMI 37.56 kg/m  General:   Alert and oriented. Pleasant and cooperative. Well-nourished and well-developed.  Head:  Normocephalic and atraumatic. Eyes:  Without icterus Abdomen:  +BS, soft, round, non-tense, large AP diamenter, +fluid wave Rectal:  Deferred  Msk:  Symmetrical without gross deformities. Normal posture. Extremities:  Without edema. Neurologic:  Alert and  oriented x4;  grossly normal  neurologically. Skin:  Intact without significant lesions or rashes. Psych:  Alert and cooperative. Normal mood and affect.   Assessment   FODAY CONE is a 73 y.o. male presenting today with a history of    PLAN   *****    Gelene Mink, PhD, ANP-BC [facility]

## 2023-09-13 NOTE — Telephone Encounter (Signed)
 [facility]   [place] scheduled for 09/20/23, Thursday at [facility], arrive at 7:15 am to check in, NPO after midnight.

## 2023-09-14 DIAGNOSIS — N2581 Secondary hyperparathyroidism of renal origin: Secondary | ICD-10-CM | POA: Diagnosis not present

## 2023-09-14 DIAGNOSIS — N250 Renal osteodystrophy: Secondary | ICD-10-CM | POA: Diagnosis not present

## 2023-09-14 DIAGNOSIS — N186 End stage renal disease: Secondary | ICD-10-CM | POA: Diagnosis not present

## 2023-09-14 DIAGNOSIS — Z992 Dependence on renal dialysis: Secondary | ICD-10-CM | POA: Diagnosis not present

## 2023-09-14 DIAGNOSIS — D631 Anemia in chronic kidney disease: Secondary | ICD-10-CM | POA: Diagnosis not present

## 2023-09-14 DIAGNOSIS — D509 Iron deficiency anemia, unspecified: Secondary | ICD-10-CM | POA: Diagnosis not present

## 2023-09-14 NOTE — Telephone Encounter (Signed)
 LMTRC

## 2023-09-15 DIAGNOSIS — N2581 Secondary hyperparathyroidism of renal origin: Secondary | ICD-10-CM | POA: Diagnosis not present

## 2023-09-15 DIAGNOSIS — N250 Renal osteodystrophy: Secondary | ICD-10-CM | POA: Diagnosis not present

## 2023-09-15 DIAGNOSIS — D631 Anemia in chronic kidney disease: Secondary | ICD-10-CM | POA: Diagnosis not present

## 2023-09-15 DIAGNOSIS — Z992 Dependence on renal dialysis: Secondary | ICD-10-CM | POA: Diagnosis not present

## 2023-09-15 DIAGNOSIS — D509 Iron deficiency anemia, unspecified: Secondary | ICD-10-CM | POA: Diagnosis not present

## 2023-09-15 DIAGNOSIS — N186 End stage renal disease: Secondary | ICD-10-CM | POA: Diagnosis not present

## 2023-09-16 DIAGNOSIS — D631 Anemia in chronic kidney disease: Secondary | ICD-10-CM | POA: Diagnosis not present

## 2023-09-16 DIAGNOSIS — N186 End stage renal disease: Secondary | ICD-10-CM | POA: Diagnosis not present

## 2023-09-16 DIAGNOSIS — N2581 Secondary hyperparathyroidism of renal origin: Secondary | ICD-10-CM | POA: Diagnosis not present

## 2023-09-16 DIAGNOSIS — Z992 Dependence on renal dialysis: Secondary | ICD-10-CM | POA: Diagnosis not present

## 2023-09-16 DIAGNOSIS — D509 Iron deficiency anemia, unspecified: Secondary | ICD-10-CM | POA: Diagnosis not present

## 2023-09-16 DIAGNOSIS — N250 Renal osteodystrophy: Secondary | ICD-10-CM | POA: Diagnosis not present

## 2023-09-17 DIAGNOSIS — N186 End stage renal disease: Secondary | ICD-10-CM | POA: Diagnosis not present

## 2023-09-17 DIAGNOSIS — Z992 Dependence on renal dialysis: Secondary | ICD-10-CM | POA: Diagnosis not present

## 2023-09-17 DIAGNOSIS — N2581 Secondary hyperparathyroidism of renal origin: Secondary | ICD-10-CM | POA: Diagnosis not present

## 2023-09-17 DIAGNOSIS — D509 Iron deficiency anemia, unspecified: Secondary | ICD-10-CM | POA: Diagnosis not present

## 2023-09-17 DIAGNOSIS — D631 Anemia in chronic kidney disease: Secondary | ICD-10-CM | POA: Diagnosis not present

## 2023-09-17 DIAGNOSIS — N250 Renal osteodystrophy: Secondary | ICD-10-CM | POA: Diagnosis not present

## 2023-09-17 NOTE — Telephone Encounter (Signed)
 Pt's wife [name] (on dpr) informed of [place] appt date, time and instructions verbalized understanding

## 2023-09-18 NOTE — Telephone Encounter (Signed)
 Patient is scheduled to see Rhunette Croft NP on 11/01/23 at 11:30 am.  Nothing further needed.

## 2023-09-19 ENCOUNTER — Encounter: Payer: Self-pay | Admitting: Gastroenterology

## 2023-09-19 DIAGNOSIS — D631 Anemia in chronic kidney disease: Secondary | ICD-10-CM | POA: Diagnosis not present

## 2023-09-19 DIAGNOSIS — D509 Iron deficiency anemia, unspecified: Secondary | ICD-10-CM | POA: Diagnosis not present

## 2023-09-19 DIAGNOSIS — N2581 Secondary hyperparathyroidism of renal origin: Secondary | ICD-10-CM | POA: Diagnosis not present

## 2023-09-19 DIAGNOSIS — N250 Renal osteodystrophy: Secondary | ICD-10-CM | POA: Diagnosis not present

## 2023-09-19 DIAGNOSIS — N186 End stage renal disease: Secondary | ICD-10-CM | POA: Diagnosis not present

## 2023-09-19 DIAGNOSIS — Z992 Dependence on renal dialysis: Secondary | ICD-10-CM | POA: Diagnosis not present

## 2023-09-20 ENCOUNTER — Ambulatory Visit (HOSPITAL_COMMUNITY)
Admission: RE | Admit: 2023-09-20 | Discharge: 2023-09-20 | Disposition: A | Discharge status: Home / Self Care | Payer: Medicare Other | Source: Ambulatory Visit | Attending: Gastroenterology | Admitting: Gastroenterology

## 2023-09-20 ENCOUNTER — Other Ambulatory Visit (HOSPITAL_COMMUNITY)
Admission: RE | Admit: 2023-09-20 | Discharge: 2023-09-20 | Disposition: A | Discharge status: Home / Self Care | Source: Ambulatory Visit | Attending: Gastroenterology | Admitting: Gastroenterology

## 2023-09-20 DIAGNOSIS — K7469 Other cirrhosis of liver: Secondary | ICD-10-CM | POA: Insufficient documentation

## 2023-09-20 DIAGNOSIS — R188 Other ascites: Secondary | ICD-10-CM | POA: Diagnosis not present

## 2023-09-20 DIAGNOSIS — K7460 Unspecified cirrhosis of liver: Secondary | ICD-10-CM | POA: Diagnosis not present

## 2023-09-20 DIAGNOSIS — K7689 Other specified diseases of liver: Secondary | ICD-10-CM | POA: Diagnosis not present

## 2023-09-20 DIAGNOSIS — N261 Atrophy of kidney (terminal): Secondary | ICD-10-CM | POA: Diagnosis not present

## 2023-09-20 LAB — CBC WITH DIFFERENTIAL/PLATELET
Abs Immature Granulocytes: 0 10*3/uL (ref 0.00–0.07)
Basophils Absolute: 0 10*3/uL (ref 0.0–0.1)
Basophils Relative: 1 %
Eosinophils Absolute: 0.2 10*3/uL (ref 0.0–0.5)
Eosinophils Relative: 5 %
HCT: 29.4 % — ABNORMAL LOW (ref 39.0–52.0)
Hemoglobin: 8.7 g/dL — ABNORMAL LOW (ref 13.0–17.0)
Immature Granulocytes: 0 %
Lymphocytes Relative: 14 %
Lymphs Abs: 0.5 10*3/uL — ABNORMAL LOW (ref 0.7–4.0)
MCH: 26.3 pg (ref 26.0–34.0)
MCHC: 29.6 g/dL — ABNORMAL LOW (ref 30.0–36.0)
MCV: 88.8 fL (ref 80.0–100.0)
Monocytes Absolute: 0.4 10*3/uL (ref 0.1–1.0)
Monocytes Relative: 12 %
Neutro Abs: 2.3 10*3/uL (ref 1.7–7.7)
Neutrophils Relative %: 68 %
Platelets: 125 10*3/uL — ABNORMAL LOW (ref 150–400)
RBC: 3.31 MIL/uL — ABNORMAL LOW (ref 4.22–5.81)
RDW: 21.1 % — ABNORMAL HIGH (ref 11.5–15.5)
Smear Review: DECREASED
WBC: 3.4 10*3/uL — ABNORMAL LOW (ref 4.0–10.5)
nRBC: 0 % (ref 0.0–0.2)

## 2023-09-20 LAB — PROTIME-INR
INR: 1.2 (ref 0.8–1.2)
Prothrombin Time: 14.9 s (ref 11.4–15.2)

## 2023-09-20 LAB — COMPREHENSIVE METABOLIC PANEL
ALT: 16 U/L (ref 0–44)
AST: 15 U/L (ref 15–41)
Albumin: 3.1 g/dL — ABNORMAL LOW (ref 3.5–5.0)
Alkaline Phosphatase: 80 U/L (ref 38–126)
Anion gap: 14 (ref 5–15)
BUN: 31 mg/dL — ABNORMAL HIGH (ref 8–23)
CO2: 25 mmol/L (ref 22–32)
Calcium: 8.8 mg/dL — ABNORMAL LOW (ref 8.9–10.3)
Chloride: 101 mmol/L (ref 98–111)
Creatinine, Ser: 7.95 mg/dL — ABNORMAL HIGH (ref 0.61–1.24)
GFR, Estimated: 7 mL/min — ABNORMAL LOW (ref >60)
Glucose, Bld: 128 mg/dL — ABNORMAL HIGH (ref 70–99)
Potassium: 4.9 mmol/L (ref 3.5–5.1)
Sodium: 140 mmol/L (ref 135–145)
Total Bilirubin: 0.7 mg/dL (ref 0.0–1.2)
Total Protein: 7.1 g/dL (ref 6.5–8.1)

## 2023-09-21 DIAGNOSIS — D509 Iron deficiency anemia, unspecified: Secondary | ICD-10-CM | POA: Diagnosis not present

## 2023-09-21 DIAGNOSIS — N2581 Secondary hyperparathyroidism of renal origin: Secondary | ICD-10-CM | POA: Diagnosis not present

## 2023-09-21 DIAGNOSIS — N186 End stage renal disease: Secondary | ICD-10-CM | POA: Diagnosis not present

## 2023-09-21 DIAGNOSIS — D631 Anemia in chronic kidney disease: Secondary | ICD-10-CM | POA: Diagnosis not present

## 2023-09-21 DIAGNOSIS — N250 Renal osteodystrophy: Secondary | ICD-10-CM | POA: Diagnosis not present

## 2023-09-21 DIAGNOSIS — Z992 Dependence on renal dialysis: Secondary | ICD-10-CM | POA: Diagnosis not present

## 2023-09-21 LAB — AFP TUMOR MARKER: AFP, Serum, Tumor Marker: 3.4 ng/mL (ref 0.0–8.4)

## 2023-09-24 DIAGNOSIS — N186 End stage renal disease: Secondary | ICD-10-CM | POA: Diagnosis not present

## 2023-09-24 DIAGNOSIS — N250 Renal osteodystrophy: Secondary | ICD-10-CM | POA: Diagnosis not present

## 2023-09-24 DIAGNOSIS — D631 Anemia in chronic kidney disease: Secondary | ICD-10-CM | POA: Diagnosis not present

## 2023-09-24 DIAGNOSIS — Z992 Dependence on renal dialysis: Secondary | ICD-10-CM | POA: Diagnosis not present

## 2023-09-24 DIAGNOSIS — N2581 Secondary hyperparathyroidism of renal origin: Secondary | ICD-10-CM | POA: Diagnosis not present

## 2023-09-24 DIAGNOSIS — D509 Iron deficiency anemia, unspecified: Secondary | ICD-10-CM | POA: Diagnosis not present

## 2023-09-24 LAB — LAB REPORT - SCANNED
A1c: 6.3
Calcium: 8.1
PTH: 978

## 2023-09-25 ENCOUNTER — Ambulatory Visit (INDEPENDENT_AMBULATORY_CARE_PROVIDER_SITE_OTHER): Payer: Medicare Other | Admitting: Nurse Practitioner

## 2023-09-25 ENCOUNTER — Encounter: Payer: Self-pay | Admitting: Nurse Practitioner

## 2023-09-25 VITALS — BP 120/64 | HR 57 | Ht 64.0 in | Wt 220.2 lb

## 2023-09-25 DIAGNOSIS — E782 Mixed hyperlipidemia: Secondary | ICD-10-CM

## 2023-09-25 DIAGNOSIS — I120 Hypertensive chronic kidney disease with stage 5 chronic kidney disease or end stage renal disease: Secondary | ICD-10-CM | POA: Diagnosis not present

## 2023-09-25 DIAGNOSIS — I10 Essential (primary) hypertension: Secondary | ICD-10-CM

## 2023-09-25 DIAGNOSIS — E1122 Type 2 diabetes mellitus with diabetic chronic kidney disease: Secondary | ICD-10-CM

## 2023-09-25 DIAGNOSIS — N186 End stage renal disease: Secondary | ICD-10-CM | POA: Diagnosis not present

## 2023-09-25 DIAGNOSIS — Z992 Dependence on renal dialysis: Secondary | ICD-10-CM

## 2023-09-25 NOTE — Progress Notes (Signed)
 09/25/2023   Endocrinology follow-up note   Subjective:    Patient ID: [name] [name], male    DOB: Jan 22, 1951, PCP Benetta Spar, MD   Past Medical History:  Diagnosis Date   Anemia    Arthritis    Asthma    Bell palsy    CAD (coronary artery disease)    a. 2014 MV: abnl w/ infap ischemia; b. 03/2013 Cath: aneurysmal bleb in the LAD w/ otw nonobs dzs-->Med Rx.   Chronic back pain    Chronic knee pain    a. 09/2015 s/p R TKA.   Chronic pain    Chronic shoulder pain    Chronic sinusitis    COPD (chronic obstructive pulmonary disease) (HCC)    Diabetes mellitus without complication (HCC)    type II    ESRD on peritoneal dialysis (HCC)    on peritoneal dialysis, [facility]   Essential hypertension    GERD (gastroesophageal reflux disease)    Gout    Hepatomegaly    noted on noncontrast CT 2015   History of hiatal hernia    Hyperlipidemia    Lateral meniscus tear    Obesity    Truncal   Obstructive sleep apnea    does not use cpap    On home oxygen therapy    uses 2l when is going somewhere per patient    PUD (peptic ulcer disease)    remote, reports f/u EGD about 8 years ago unremarkable    Reactive airway disease    related to exposure to chemical during 9/11   Sinusitis    Vitamin D deficiency    Past Surgical History:  Procedure Laterality Date   A/V FISTULAGRAM N/A 02/28/2023   Procedure: A/V Fistulagram;  Surgeon: Victorino Sparrow, MD;  Location: [facility] INVASIVE CV LAB;  Service: Cardiovascular;  Laterality: N/A;   ASAD LT SHOULDER  12/15/2008   left shoulder   AV FISTULA PLACEMENT Left 08/09/2016   Procedure: BRACHIOCEPHALIC ARTERIOVENOUS (AV) FISTULA CREATION LEFT ARM;  Surgeon: Sherren Kerns, MD;  Location: [facility] OR;  Service: Vascular;  Laterality: Left;   CAPD INSERTION N/A 10/07/2018   Procedure: LAPAROSCOPIC PERITONEAL CATHETER PLACEMENT;  Surgeon: Rodman Pickle, MD;  Location: [facility] ORS;  Service: General;   Laterality: N/A;   CATARACT EXTRACTION W/PHACO Left 03/28/2016   Procedure: CATARACT EXTRACTION PHACO AND INTRAOCULAR LENS PLACEMENT LEFT EYE;  Surgeon: Jethro Bolus, MD;  Location: [facility] ORS;  Service: Ophthalmology;  Laterality: Left;  CDE: 4.77   CATARACT EXTRACTION W/PHACO Right 04/11/2016   Procedure: CATARACT EXTRACTION PHACO AND INTRAOCULAR LENS PLACEMENT RIGHT EYE; CDE:  4.74;  Surgeon: Jethro Bolus, MD;  Location: [facility] ORS;  Service: Ophthalmology;  Laterality: Right;   COLONOSCOPY  10/15/2008   Fields: Rectal polyp obliterated, not retrieved, hemorrhoids, single ascending colon diverticulum near the CV. Next colonoscopy April 2020   COLONOSCOPY N/A 12/25/2014   SLF: 1. Colorectal polyps (2) removed 2. Small internal hemorrhoids 3. the left colon is severely redundant. hyperplastic polyps   CORONARY ARTERY BYPASS GRAFT N/A 06/05/2022   Procedure: OFF PUMP CORONARY ARTERY BYPASS GRAFTING (CABG) X 2 BYPASSES USING LEFT INTERNAL MAMMARY ARTERY AND RIGHT LEG GREATER SAPHENOUS VEIN HARVESTED ENDOSCOPICALLY;  Surgeon: Corliss Skains, MD;  Location: [facility] OR;  Service: Open Heart Surgery;  Laterality: N/A;   CORONARY STENT INTERVENTION N/A 07/25/2021   Procedure: CORONARY STENT INTERVENTION;  Surgeon: Tonny Bollman, MD;  Location: [facility] INVASIVE CV LAB;  Service: Cardiovascular;  Laterality: N/A;   CORONARY STENT INTERVENTION N/A 12/26/2021   Procedure: CORONARY STENT INTERVENTION;  Surgeon: Yvonne Kendall, MD;  Location: [facility] INVASIVE CV LAB;  Service: Cardiovascular;  Laterality: N/A;   CORONARY STENT INTERVENTION N/A 01/20/2022   Procedure: CORONARY STENT INTERVENTION;  Surgeon: Tonny Bollman, MD;  Location: [facility] INVASIVE CV LAB;  Service: Cardiovascular;  Laterality: N/A;   DOPPLER ECHOCARDIOGRAPHY     ESOPHAGOGASTRODUODENOSCOPY N/A 12/25/2014   SLF: 1. Anemia most likely due to CRI, gastritis, gastric polyps 2. Moderate non-erosive gastriits and mild duodenitis.  3.TWo large gstric polyps  removed.    EYE SURGERY  12/22/2010   tear duct probing-[place]   FOREIGN BODY REMOVAL  03/29/2011   Procedure: REMOVAL FOREIGN BODY EXTREMITY;  Surgeon: Fuller Canada, MD;  Location: [facility];  Service: Orthopedics;  Laterality: Right;  Removal Foreign Body Right Thumb   IR FLUORO GUIDE CV LINE RIGHT  08/06/2018   IR FLUORO GUIDE CV LINE RIGHT  02/17/2023   IR US GUIDE VASC ACCESS RIGHT  08/06/2018   IR US GUIDE VASC ACCESS RIGHT  02/17/2023   KNEE ARTHROSCOPY  10/16/2007   left   KNEE ARTHROSCOPY WITH LATERAL MENISECTOMY Right 10/14/2015   Procedure: LEFT KNEE ARTHROSCOPY WITH PARTIAL LATERAL MENISECTOMY;  Surgeon: Vickki Hearing, MD;  Location: [facility];  Service: Orthopedics;  Laterality: Right;   LEFT HEART CATH AND CORONARY ANGIOGRAPHY N/A 07/25/2021   Procedure: LEFT HEART CATH AND CORONARY ANGIOGRAPHY;  Surgeon: Tonny Bollman, MD;  Location: [facility] INVASIVE CV LAB;  Service: Cardiovascular;  Laterality: N/A;   LEFT HEART CATH AND CORONARY ANGIOGRAPHY N/A 12/26/2021   Procedure: LEFT HEART CATH AND CORONARY ANGIOGRAPHY;  Surgeon: Yvonne Kendall, MD;  Location: [facility] INVASIVE CV LAB;  Service: Cardiovascular;  Laterality: N/A;   LEFT HEART CATH AND CORONARY ANGIOGRAPHY N/A 01/20/2022   Procedure: LEFT HEART CATH AND CORONARY ANGIOGRAPHY;  Surgeon: Tonny Bollman, MD;  Location: [facility] INVASIVE CV LAB;  Service: Cardiovascular;  Laterality: N/A;   LEFT HEART CATHETERIZATION WITH CORONARY ANGIOGRAM N/A 03/28/2013   Procedure: LEFT HEART CATHETERIZATION WITH CORONARY ANGIOGRAM;  Surgeon: Marykay Lex, MD;  Location: [facility] CATH LAB;  Service: Cardiovascular;  Laterality: N/A;   NM MYOVIEW LTD     PENILE PROSTHESIS IMPLANT N/A 08/16/2015   Procedure: PENILE PROTHESIS INFLATABLE, three piece, Excisional biopsy of Penile ulcer, Penile molding;  Surgeon: Jethro Bolus, MD;  Location: [facility]S;  Service: Urology;  Laterality: N/A;   PENILE PROSTHESIS IMPLANT N/A 12/24/2017   Procedure: REMOVAL  AND  REPLACEMENT  COLOPLAST PENILE PROSTHESIS;  Surgeon: Crista Elliot, MD;  Location: [facility]S;  Service: Urology;  Laterality: N/A;   PERIPHERAL VASCULAR BALLOON ANGIOPLASTY  02/28/2023   Procedure: PERIPHERAL VASCULAR BALLOON ANGIOPLASTY;  Surgeon: Victorino Sparrow, MD;  Location: [facility] INVASIVE CV LAB;  Service: Cardiovascular;;   PORT-A-CATH REMOVAL N/A 02/19/2023   Procedure: REMOVAL OF PERITONEAL DIALYSIS CATHETER;  Surgeon: Victorino Sparrow, MD;  Location: [facility] OR;  Service: Vascular;  Laterality: N/A;   QUADRICEPS TENDON REPAIR  07/21/2011   Procedure: REPAIR QUADRICEP TENDON;  Surgeon: Fuller Canada, MD;  Location: [facility];  Service: Orthopedics;  Laterality: Right;   RIGHT/LEFT HEART CATH AND CORONARY ANGIOGRAPHY N/A 05/30/2022   Procedure: RIGHT/LEFT HEART CATH AND CORONARY ANGIOGRAPHY;  Surgeon: Corky Crafts, MD;  Location: [facility] INVASIVE CV LAB;  Service: Cardiovascular;  Laterality: N/A;   TEE WITHOUT CARDIOVERSION N/A 06/05/2022   Procedure: TRANSESOPHAGEAL  ECHOCARDIOGRAM (TEE);  Surgeon: Corliss Skains, MD;  Location: [facility] OR;  Service: Open Heart Surgery;  Laterality: N/A;   TOENAIL EXCISION     removed x2-bilateral   UMBILICAL HERNIA REPAIR  07/17/2005   [place]   Social History   Socioeconomic History   Marital status: Married    Spouse name: Not on file   Number of children: 2   Years of education: 12th grade   Highest education level: Not on file  Occupational History   Occupation: disabled   Occupation:      Associate Professor: UNEMPLOYED  Tobacco Use   Smoking status: Former    Current packs/day: 0.00    Average packs/day: 1 pack/day for 25.0 years (25.0 ttl pk-yrs)    Types: Cigarettes    Start date: 03/27/1985    Quit date: 03/27/2010    Years since quitting: 13.5   Smokeless tobacco: Never   Tobacco comments:    Quit x 7 years  Vaping Use   Vaping status: Never Used  Substance and Sexual Activity   Alcohol use: Not Currently    Comment: occasionally    Drug use: Yes    Types: Marijuana    Comment: cocaine- last time used- 11/24/2017 , marijuana-    Sexual activity: Yes  Other Topics Concern   Not on file  Social History Narrative   He quit smoking in 2010. 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 He developed pulmonary problems, became disabled because of lower airway disease in 2009.       WATCHES BASKETBALL. HIS TEAM IS [place].   Social Drivers of Corporate investment banker Strain: Low Risk  (03/05/2023)   Received from [facility]   Overall Financial Resource Strain (CARDIA)    Difficulty of Paying Living Expenses: Not hard at all  Food Insecurity: No Food Insecurity (08/25/2023)   Hunger Vital Sign    Worried About Running Out of Food in the Last Year: Never true    Ran Out of Food in the Last Year: Never true  Transportation Needs: No Transportation Needs (08/25/2023)   PRAPARE - Administrator, Civil Service (Medical): No    Lack of Transportation (Non-Medical): No  Physical Activity: Sufficiently Active (12/29/2021)   Exercise Vital Sign    Days of Exercise per Week: 3 days    Minutes of Exercise per Session: 60 min  Stress: Patient Declined (03/12/2023)   Received from [facility]   [facility] of Occupational Health - Occupational Stress Questionnaire    Feeling of Stress : Patient declined  Social Connections: Socially Integrated (08/25/2023)   Social Connection and Isolation Panel [NHANES]    Frequency of Communication with Friends and Family: More than three times a week    Frequency of Social Gatherings with Friends and Family: More than three times a week    Attends Religious Services: More than 4 times per year    Active Member of [employer] or Organizations: Yes    Attends Banker Meetings: More than 4 times per year    Marital Status: Married   Outpatient Encounter Medications as of 09/25/2023  Medication Sig   albuterol (VENTOLIN HFA) 108  (90 Base) MCG/ACT inhaler Inhale 2 puffs into the lungs every 6 (six) hours as needed for wheezing or shortness of breath.   amLODipine (NORVASC) 5 MG tablet Take 1 tablet (5 mg total) by mouth daily.   ascorbic acid (VITAMIN C) 500  MG tablet Take 1 tablet (500 mg total) by mouth daily.   aspirin EC 81 MG tablet Take 81 mg by mouth daily. Swallow whole.   atorvastatin (LIPITOR) 80 MG tablet Take 1 tablet (80 mg total) by mouth daily.   carvedilol (COREG) 6.25 MG tablet Take 6.25 mg by mouth 2 (two) times daily with a meal.   esomeprazole (NEXIUM) 40 MG capsule Take 1 capsule (40 mg total) by mouth 2 (two) times daily before a meal.   fluticasone furoate-vilanterol (BREO ELLIPTA) 200-25 MCG/ACT AEPB Use 1 (one) inhaltion once a day.   hydrocortisone (ANUSOL-HC) 2.5 % rectal cream Place 1 Application rectally 2 (two) times daily. (Patient taking differently: Place 1 Application rectally as needed for hemorrhoids or anal itching.)   tamsulosin (FLOMAX) 0.4 MG CAPS capsule Take 0.4 mg by mouth daily after supper.   No facility-administered encounter medications on file as of 09/25/2023.   ALLERGIES: Allergies  Allergen Reactions   Tramadol Itching   Opana [Oxymorphone Hcl] Itching   VACCINATION STATUS: Immunization History  Administered Date(s) Administered   Fluad Quad(high Dose 65+) 04/16/2022   Influenza, High Dose Seasonal PF 09/12/2018, 04/17/2019, 05/11/2020   Influenza-Unspecified 09/12/2018, 04/01/2019, 04/22/2021   Moderna Sars-Covid-2 Vaccination 09/18/2019, 10/16/2019, 05/24/2020   PPD Test 07/19/2018, 09/07/2018, 11/18/2019   Pneumococcal Polysaccharide-23 09/13/2018, 05/03/2021   Td 03/27/2011    Diabetes He presents for his follow-up diabetic visit. He has type 2 diabetes mellitus. Onset time: He was diagnosed approximately 20 years ago. His disease course has been stable. There are no hypoglycemic associated symptoms. Pertinent negatives for hypoglycemia include no  confusion, headaches, pallor or seizures. Pertinent negatives for diabetes include no chest pain, no fatigue, no polydipsia, no polyphagia, no polyuria, no weakness and no weight loss. There are no hypoglycemic complications. Symptoms are stable. Diabetic complications include heart disease and nephropathy. (Patient with end-stage renal disease now on hemodialysis.) Risk factors for coronary artery disease include dyslipidemia, diabetes mellitus, hypertension, obesity, sedentary lifestyle, tobacco exposure and male sex. When asked about current treatments, none were reported. He is compliant with treatment all of the time. His weight is fluctuating minimally. He is following a generally healthy diet. When asked about meal planning, he reported none. He has had a previous visit with a dietitian. He rarely participates in exercise. His overall blood glucose range is 140-180 mg/dl. (He presents today, with his meter and logs showing at target glycemic profile.  His most recent A1c on 1/20 was 6.3%, increasing slightly but not at a point where medication is needed.  He does have chronica anemia resulting from ESRD so A1c may not be the most accurate measure of his diabetes control.   Analysis of his meter shows 7-day average of 150, 14-day average of 152, 30-day average of 156.  He did recently have hospitalization for the flu and is getting back on track from that.) An ACE inhibitor/angiotensin II receptor blocker is being taken. He sees a podiatrist.Eye exam is current.  Hypertension This is a chronic problem. The current episode started more than 1 year ago. The problem has been resolved since onset. The problem is controlled. Pertinent negatives include no chest pain, headaches, neck pain, palpitations or shortness of breath. There are no associated agents to hypertension. Risk factors for coronary artery disease include diabetes mellitus, dyslipidemia, obesity, smoking/tobacco exposure, sedentary lifestyle and  male gender. Past treatments include angiotensin blockers, calcium channel blockers, diuretics and beta blockers. There are no compliance problems.  Hypertensive end-organ damage includes  kidney disease and CAD/MI. Identifiable causes of hypertension include chronic renal disease and sleep apnea.  Hyperlipidemia This is a chronic problem. The current episode started more than 1 year ago. The problem is controlled. Recent lipid tests were reviewed and are normal (LDL normal at 63 triglycerides elevated at 157.). Exacerbating diseases include chronic renal disease, diabetes and obesity. Factors aggravating his hyperlipidemia include fatty foods. Pertinent negatives include no chest pain, myalgias or shortness of breath. Current antihyperlipidemic treatment includes statins. The current treatment provides moderate improvement of lipids. There are no compliance problems.  Risk factors for coronary artery disease include diabetes mellitus, dyslipidemia, hypertension, male sex, a sedentary lifestyle and obesity.    Review of systems  Constitutional: + stable body weight,  current Body mass index is 37.8 kg/m. , no fatigue, no subjective hyperthermia, no subjective hypothermia Eyes: no blurry vision, no xerophthalmia ENT: no sore throat, no nodules palpated in throat, no dysphagia/odynophagia, no hoarseness Cardiovascular: no chest pain, no shortness of breath, no palpitations, no leg swelling Respiratory: no cough, no shortness of breath Gastrointestinal: no nausea/vomiting/diarrhea Musculoskeletal: no muscle/joint aches Skin: no rashes, no hyperemia Neurological: no tremors, no numbness, no tingling, no dizziness Psychiatric: no depression, no anxiety   Objective:    BP 120/64 (BP Location: Right Arm, Patient Position: Sitting, Cuff Size: Large)   Pulse (!) 57   Ht 5\' 4"  (1.626 m)   Wt 220 lb 3.2 oz (99.9 kg)   BMI 37.80 kg/m   Wt Readings from Last 3 Encounters:  09/25/23 220 lb 3.2 oz (99.9  kg)  09/13/23 218 lb 12.8 oz (99.2 kg)  08/24/23 222 lb 0.1 oz (100.7 kg)    BP Readings from Last 3 Encounters:  09/25/23 120/64  09/13/23 116/60  08/26/23 115/88     Physical Exam- Limited  Constitutional:  Body mass index is 37.8 kg/m. , not in acute distress, normal state of mind Eyes:  EOMI, no exophthalmos Musculoskeletal: no gross deformities, strength intact in all four extremities, no gross restriction of joint movements Skin:  no rashes, no hyperemia Neurological: no tremor with outstretched hands   Diabetic Foot Exam - Simple   No data filed      Results for orders placed or performed during the hospital encounter of 09/20/23  CBC with Differential/Platelet   Collection Time: 09/20/23  8:45 AM  Result Value Ref Range   WBC 3.4 (L) 4.0 - 10.5 K/uL   RBC 3.31 (L) 4.22 - 5.81 MIL/uL   Hemoglobin 8.7 (L) 13.0 - 17.0 g/dL   HCT 16.1 (L) 09.6 - 04.5 %   MCV 88.8 80.0 - 100.0 fL   MCH 26.3 26.0 - 34.0 pg   MCHC 29.6 (L) 30.0 - 36.0 g/dL   RDW 40.9 (H) 81.1 - 91.4 %   Platelets 125 (L) 150 - 400 K/uL   nRBC 0.0 0.0 - 0.2 %   Neutrophils Relative % 68 %   Neutro Abs 2.3 1.7 - 7.7 K/uL   Lymphocytes Relative 14 %   Lymphs Abs 0.5 (L) 0.7 - 4.0 K/uL   Monocytes Relative 12 %   Monocytes Absolute 0.4 0.1 - 1.0 K/uL   Eosinophils Relative 5 %   Eosinophils Absolute 0.2 0.0 - 0.5 K/uL   Basophils Relative 1 %   Basophils Absolute 0.0 0.0 - 0.1 K/uL   WBC Morphology MORPHOLOGY UNREMARKABLE    RBC Morphology See Note    Smear Review PLATELETS APPEAR DECREASED    Immature Granulocytes 0 %  Abs Immature Granulocytes 0.00 0.00 - 0.07 K/uL   Acanthocytes PRESENT    Polychromasia PRESENT    Ovalocytes PRESENT   Protime-INR   Collection Time: 09/20/23  8:45 AM  Result Value Ref Range   Prothrombin Time 14.9 11.4 - 15.2 seconds   INR 1.2 0.8 - 1.2  Comprehensive metabolic panel   Collection Time: 09/20/23  8:45 AM  Result Value Ref Range   Sodium 140 135 - 145  mmol/L   Potassium 4.9 3.5 - 5.1 mmol/L   Chloride 101 98 - 111 mmol/L   CO2 25 22 - 32 mmol/L   Glucose, Bld 128 (H) 70 - 99 mg/dL   BUN 31 (H) 8 - 23 mg/dL   Creatinine, Ser 1.61 (H) 0.61 - 1.24 mg/dL   Calcium 8.8 (L) 8.9 - 10.3 mg/dL   Total Protein 7.1 6.5 - 8.1 g/dL   Albumin 3.1 (L) 3.5 - 5.0 g/dL   AST 15 15 - 41 U/L   ALT 16 0 - 44 U/L   Alkaline Phosphatase 80 38 - 126 U/L   Total Bilirubin 0.7 0.0 - 1.2 mg/dL   GFR, Estimated 7 (L) >60 mL/min   Anion gap 14 5 - 15  AFP tumor marker   Collection Time: 09/20/23  8:45 AM  Result Value Ref Range   AFP, Serum, Tumor Marker 3.4 0.0 - 8.4 ng/mL   *Note: Due to a large number of results and/or encounters for the requested time period, some results have not been displayed. A complete set of results can be found in Results Review.   Diabetic Labs (most recent): Lab Results  Component Value Date   HGBA1C 4.9 05/03/2023   HGBA1C 6.3 (H) 03/04/2023   HGBA1C 7.1 (H) 02/17/2023   MICROALBUR 7.4 02/09/2020   MICROALBUR 57.0 06/26/2017   Lipid Panel     Component Value Date/Time   CHOL 90 05/03/2023 0518   CHOL 104 06/10/2021 1337   TRIG 55 05/03/2023 0518   HDL 38 (L) 05/03/2023 0518   HDL 30 (L) 06/10/2021 1337   CHOLHDL 2.4 05/03/2023 0518   VLDL 11 05/03/2023 0518   LDLCALC 41 05/03/2023 0518   LDLCALC 47 11/23/2021 1330     Assessment & Plan:   1) Type 2 diabetes mellitus with ESRD on HD, with long-term current use of insulin (HCC)  -His  Diabetes is complicated by end-stage renal disease recently initiated on hemodialysis,  and patient remains at a high risk for more acute and chronic complications of diabetes which include CAD, CVA, CKD, retinopathy, and neuropathy. These are all discussed in detail with the patient.  He presents today, with his meter and logs showing at target glycemic profile.  His most recent A1c on 1/20 was 6.3%, increasing slightly but not at a point where medication is needed.  He does have  chronica anemia resulting from ESRD so A1c may not be the most accurate measure of his diabetes control.   Analysis of his meter shows 7-day average of 150, 14-day average of 152, 30-day average of 156.  He did recently have hospitalization for the flu and is getting back on track from that.   Recent labs reviewed.  - Nutritional counseling repeated at each appointment due to patients tendency to fall back in to old habits.  - The patient admits there is a room for improvement in their diet and drink choices. -  Suggestion is made for the patient to avoid simple carbohydrates from their diet including  Cakes, Sweet Desserts / Pastries, Ice Cream, Soda (diet and regular), Sweet Tea, Candies, Chips, Cookies, Sweet Pastries, Store Bought Juices, Alcohol in Excess of 1-2 drinks a day, Artificial Sweeteners, Coffee Creamer, and "Sugar-free" Products. This will help patient to have stable blood glucose profile and potentially avoid unintended weight gain.   - I encouraged the patient to switch to unprocessed or minimally processed complex starch and increased protein intake (animal or plant source), fruits, and vegetables.   - Patient is advised to stick to a routine mealtimes to eat 3 meals a day and avoid unnecessary snacks (to snack only to correct hypoglycemia).  - I have approached patient with the following individualized plan to manage diabetes and patient agrees.  -He is advised to stay away from alcohol and smoking.  -He is advised to stay off his diabetes medications at this time.  I did ask that he monitor fasting glucose 2-3 times per week and notify me if readings are above 200 consistently.  -He will continue to follow-up with nephrology given stage 5 CKD on PD.  -he is not a candidate for metformin and SGLT2 inhibitors due to CKD.   - Patient specific target  for A1c; LDL, HDL, Triglycerides, were discussed in detail.  2) BP/HTN:  His blood pressure is controlled to target. He is  advised to continue medications per PCP or nephrology recommendation.    3) Lipids/HPL:  His recent lipid panel from 05/03/23 shows controlled LDL stable at 41.  He is advised to continue Simvastatin 20 mg p.o. nightly.    4)  Weight/Diet: His Body mass index is 37.8 kg/m.- clearly complicating his DM care. He is a candidate for modest weight loss.   CDE consult in progress, exercise, and carbohydrates information provided.  5) Chronic Care/Health Maintenance: -Patient is on ARB and Statin medications and encouraged to continue to follow up with Ophthalmology, nephrology (he is status post fistula placement in prep for hemodialysis) Podiatrist at least yearly or according to recommendations, and advised to quit smoking. I have recommended yearly flu vaccine and pneumonia vaccination at least every 5 years; moderate intensity exercise for up to 150 minutes weekly; and  sleep for at least 7 hours a day.  - I advised patient to maintain close follow up with his PCP for primary care needs.         I spent  28  minutes in the care of the patient today including review of labs from CMP, Lipids, Thyroid Function, Hematology (current and previous including abstractions from other facilities); face-to-face time discussing  his blood glucose readings/logs, discussing hypoglycemia and hyperglycemia episodes and symptoms, medications doses, his options of short and long term treatment based on the latest standards of care / guidelines;  discussion about incorporating lifestyle medicine;  and documenting the encounter. Risk reduction counseling performed per USPSTF guidelines to reduce obesity and cardiovascular risk factors.     Please refer to Patient Instructions for Blood Glucose Monitoring and Insulin/Medications Dosing Guide"  in media tab for additional information. Please  also refer to " Patient Self Inventory" in the Media  tab for reviewed elements of pertinent patient history.  [name] [name]  participated in the discussions, expressed understanding, and voiced agreement with the above plans.  All questions were answered to his satisfaction. he is encouraged to contact clinic should he have any questions or concerns prior to his return visit.    Follow up plan: -Return in about 4 months (around 01/25/2024) for Diabetes  F/U with A1c in office, No previsit labs.    Ronny Bacon, [facility] [address] [place], [place] [address] Phone: [phone] Fax: [phone]   09/25/2023, 10:59 AM

## 2023-09-26 DIAGNOSIS — N250 Renal osteodystrophy: Secondary | ICD-10-CM | POA: Diagnosis not present

## 2023-09-26 DIAGNOSIS — Z992 Dependence on renal dialysis: Secondary | ICD-10-CM | POA: Diagnosis not present

## 2023-09-26 DIAGNOSIS — N186 End stage renal disease: Secondary | ICD-10-CM | POA: Diagnosis not present

## 2023-09-26 DIAGNOSIS — N2581 Secondary hyperparathyroidism of renal origin: Secondary | ICD-10-CM | POA: Diagnosis not present

## 2023-09-26 DIAGNOSIS — D631 Anemia in chronic kidney disease: Secondary | ICD-10-CM | POA: Diagnosis not present

## 2023-09-26 DIAGNOSIS — D509 Iron deficiency anemia, unspecified: Secondary | ICD-10-CM | POA: Diagnosis not present

## 2023-09-28 DIAGNOSIS — Z992 Dependence on renal dialysis: Secondary | ICD-10-CM | POA: Diagnosis not present

## 2023-09-28 DIAGNOSIS — D631 Anemia in chronic kidney disease: Secondary | ICD-10-CM | POA: Diagnosis not present

## 2023-09-28 DIAGNOSIS — D509 Iron deficiency anemia, unspecified: Secondary | ICD-10-CM | POA: Diagnosis not present

## 2023-09-28 DIAGNOSIS — N250 Renal osteodystrophy: Secondary | ICD-10-CM | POA: Diagnosis not present

## 2023-09-28 DIAGNOSIS — N2581 Secondary hyperparathyroidism of renal origin: Secondary | ICD-10-CM | POA: Diagnosis not present

## 2023-09-28 DIAGNOSIS — N186 End stage renal disease: Secondary | ICD-10-CM | POA: Diagnosis not present

## 2023-10-01 DIAGNOSIS — D509 Iron deficiency anemia, unspecified: Secondary | ICD-10-CM | POA: Diagnosis not present

## 2023-10-01 DIAGNOSIS — Z992 Dependence on renal dialysis: Secondary | ICD-10-CM | POA: Diagnosis not present

## 2023-10-01 DIAGNOSIS — N2581 Secondary hyperparathyroidism of renal origin: Secondary | ICD-10-CM | POA: Diagnosis not present

## 2023-10-01 DIAGNOSIS — D631 Anemia in chronic kidney disease: Secondary | ICD-10-CM | POA: Diagnosis not present

## 2023-10-01 DIAGNOSIS — N186 End stage renal disease: Secondary | ICD-10-CM | POA: Diagnosis not present

## 2023-10-01 DIAGNOSIS — N250 Renal osteodystrophy: Secondary | ICD-10-CM | POA: Diagnosis not present

## 2023-10-02 ENCOUNTER — Telehealth: Payer: Self-pay | Admitting: Internal Medicine

## 2023-10-02 DIAGNOSIS — N186 End stage renal disease: Secondary | ICD-10-CM | POA: Diagnosis not present

## 2023-10-02 DIAGNOSIS — E1165 Type 2 diabetes mellitus with hyperglycemia: Secondary | ICD-10-CM | POA: Diagnosis not present

## 2023-10-02 DIAGNOSIS — J449 Chronic obstructive pulmonary disease, unspecified: Secondary | ICD-10-CM | POA: Diagnosis not present

## 2023-10-02 DIAGNOSIS — I10 Essential (primary) hypertension: Secondary | ICD-10-CM | POA: Diagnosis not present

## 2023-10-02 NOTE — Telephone Encounter (Signed)
 LVM for patient to call and discuss New Patient appointment with Dr. Sherene Sires for May 2025 (former Dr. Vassie Loll patient)  Patient is scheduled to see Micheline Maze, NP for sleep apnea follow up

## 2023-10-03 DIAGNOSIS — N186 End stage renal disease: Secondary | ICD-10-CM | POA: Diagnosis not present

## 2023-10-03 DIAGNOSIS — N2581 Secondary hyperparathyroidism of renal origin: Secondary | ICD-10-CM | POA: Diagnosis not present

## 2023-10-03 DIAGNOSIS — D631 Anemia in chronic kidney disease: Secondary | ICD-10-CM | POA: Diagnosis not present

## 2023-10-03 DIAGNOSIS — D509 Iron deficiency anemia, unspecified: Secondary | ICD-10-CM | POA: Diagnosis not present

## 2023-10-03 DIAGNOSIS — Z992 Dependence on renal dialysis: Secondary | ICD-10-CM | POA: Diagnosis not present

## 2023-10-03 DIAGNOSIS — N250 Renal osteodystrophy: Secondary | ICD-10-CM | POA: Diagnosis not present

## 2023-10-05 DIAGNOSIS — D509 Iron deficiency anemia, unspecified: Secondary | ICD-10-CM | POA: Diagnosis not present

## 2023-10-05 DIAGNOSIS — N250 Renal osteodystrophy: Secondary | ICD-10-CM | POA: Diagnosis not present

## 2023-10-05 DIAGNOSIS — Z992 Dependence on renal dialysis: Secondary | ICD-10-CM | POA: Diagnosis not present

## 2023-10-05 DIAGNOSIS — D631 Anemia in chronic kidney disease: Secondary | ICD-10-CM | POA: Diagnosis not present

## 2023-10-05 DIAGNOSIS — N186 End stage renal disease: Secondary | ICD-10-CM | POA: Diagnosis not present

## 2023-10-05 DIAGNOSIS — N2581 Secondary hyperparathyroidism of renal origin: Secondary | ICD-10-CM | POA: Diagnosis not present

## 2023-10-08 DIAGNOSIS — N250 Renal osteodystrophy: Secondary | ICD-10-CM | POA: Diagnosis not present

## 2023-10-08 DIAGNOSIS — Z992 Dependence on renal dialysis: Secondary | ICD-10-CM | POA: Diagnosis not present

## 2023-10-08 DIAGNOSIS — N2581 Secondary hyperparathyroidism of renal origin: Secondary | ICD-10-CM | POA: Diagnosis not present

## 2023-10-08 DIAGNOSIS — D509 Iron deficiency anemia, unspecified: Secondary | ICD-10-CM | POA: Diagnosis not present

## 2023-10-08 DIAGNOSIS — D631 Anemia in chronic kidney disease: Secondary | ICD-10-CM | POA: Diagnosis not present

## 2023-10-08 DIAGNOSIS — N186 End stage renal disease: Secondary | ICD-10-CM | POA: Diagnosis not present

## 2023-10-10 DIAGNOSIS — N2581 Secondary hyperparathyroidism of renal origin: Secondary | ICD-10-CM | POA: Diagnosis not present

## 2023-10-10 DIAGNOSIS — Z992 Dependence on renal dialysis: Secondary | ICD-10-CM | POA: Diagnosis not present

## 2023-10-10 DIAGNOSIS — D509 Iron deficiency anemia, unspecified: Secondary | ICD-10-CM | POA: Diagnosis not present

## 2023-10-10 DIAGNOSIS — N250 Renal osteodystrophy: Secondary | ICD-10-CM | POA: Diagnosis not present

## 2023-10-10 DIAGNOSIS — D631 Anemia in chronic kidney disease: Secondary | ICD-10-CM | POA: Diagnosis not present

## 2023-10-10 DIAGNOSIS — N186 End stage renal disease: Secondary | ICD-10-CM | POA: Diagnosis not present

## 2023-10-12 DIAGNOSIS — N186 End stage renal disease: Secondary | ICD-10-CM | POA: Diagnosis not present

## 2023-10-12 DIAGNOSIS — Z992 Dependence on renal dialysis: Secondary | ICD-10-CM | POA: Diagnosis not present

## 2023-10-12 DIAGNOSIS — N2581 Secondary hyperparathyroidism of renal origin: Secondary | ICD-10-CM | POA: Diagnosis not present

## 2023-10-12 DIAGNOSIS — N250 Renal osteodystrophy: Secondary | ICD-10-CM | POA: Diagnosis not present

## 2023-10-12 DIAGNOSIS — D631 Anemia in chronic kidney disease: Secondary | ICD-10-CM | POA: Diagnosis not present

## 2023-10-12 DIAGNOSIS — D509 Iron deficiency anemia, unspecified: Secondary | ICD-10-CM | POA: Diagnosis not present

## 2023-10-15 DIAGNOSIS — D631 Anemia in chronic kidney disease: Secondary | ICD-10-CM | POA: Diagnosis not present

## 2023-10-15 DIAGNOSIS — Z992 Dependence on renal dialysis: Secondary | ICD-10-CM | POA: Diagnosis not present

## 2023-10-15 DIAGNOSIS — D509 Iron deficiency anemia, unspecified: Secondary | ICD-10-CM | POA: Diagnosis not present

## 2023-10-15 DIAGNOSIS — N250 Renal osteodystrophy: Secondary | ICD-10-CM | POA: Diagnosis not present

## 2023-10-15 DIAGNOSIS — N2581 Secondary hyperparathyroidism of renal origin: Secondary | ICD-10-CM | POA: Diagnosis not present

## 2023-10-15 DIAGNOSIS — N186 End stage renal disease: Secondary | ICD-10-CM | POA: Diagnosis not present

## 2023-10-16 DIAGNOSIS — D509 Iron deficiency anemia, unspecified: Secondary | ICD-10-CM | POA: Diagnosis not present

## 2023-10-16 DIAGNOSIS — N2581 Secondary hyperparathyroidism of renal origin: Secondary | ICD-10-CM | POA: Diagnosis not present

## 2023-10-16 DIAGNOSIS — Z992 Dependence on renal dialysis: Secondary | ICD-10-CM | POA: Diagnosis not present

## 2023-10-16 DIAGNOSIS — D631 Anemia in chronic kidney disease: Secondary | ICD-10-CM | POA: Diagnosis not present

## 2023-10-16 DIAGNOSIS — G4733 Obstructive sleep apnea (adult) (pediatric): Secondary | ICD-10-CM | POA: Diagnosis not present

## 2023-10-16 DIAGNOSIS — N186 End stage renal disease: Secondary | ICD-10-CM | POA: Diagnosis not present

## 2023-10-16 DIAGNOSIS — H9202 Otalgia, left ear: Secondary | ICD-10-CM | POA: Diagnosis not present

## 2023-10-17 DIAGNOSIS — D631 Anemia in chronic kidney disease: Secondary | ICD-10-CM | POA: Diagnosis not present

## 2023-10-17 DIAGNOSIS — Z992 Dependence on renal dialysis: Secondary | ICD-10-CM | POA: Diagnosis not present

## 2023-10-17 DIAGNOSIS — N2581 Secondary hyperparathyroidism of renal origin: Secondary | ICD-10-CM | POA: Diagnosis not present

## 2023-10-17 DIAGNOSIS — D509 Iron deficiency anemia, unspecified: Secondary | ICD-10-CM | POA: Diagnosis not present

## 2023-10-17 DIAGNOSIS — N186 End stage renal disease: Secondary | ICD-10-CM | POA: Diagnosis not present

## 2023-10-18 ENCOUNTER — Other Ambulatory Visit: Payer: Self-pay

## 2023-10-18 DIAGNOSIS — N186 End stage renal disease: Secondary | ICD-10-CM

## 2023-10-19 DIAGNOSIS — N186 End stage renal disease: Secondary | ICD-10-CM | POA: Diagnosis not present

## 2023-10-19 DIAGNOSIS — D509 Iron deficiency anemia, unspecified: Secondary | ICD-10-CM | POA: Diagnosis not present

## 2023-10-19 DIAGNOSIS — N2581 Secondary hyperparathyroidism of renal origin: Secondary | ICD-10-CM | POA: Diagnosis not present

## 2023-10-19 DIAGNOSIS — Z992 Dependence on renal dialysis: Secondary | ICD-10-CM | POA: Diagnosis not present

## 2023-10-19 DIAGNOSIS — D631 Anemia in chronic kidney disease: Secondary | ICD-10-CM | POA: Diagnosis not present

## 2023-10-22 ENCOUNTER — Other Ambulatory Visit: Payer: Self-pay

## 2023-10-22 ENCOUNTER — Encounter (HOSPITAL_COMMUNITY): Admission: RE | Payer: Self-pay | Source: Ambulatory Visit

## 2023-10-22 ENCOUNTER — Telehealth: Payer: Self-pay

## 2023-10-22 ENCOUNTER — Ambulatory Visit (HOSPITAL_COMMUNITY)
Admission: RE | Admit: 2023-10-22 | Discharge: 2023-10-22 | Disposition: A | Discharge status: Home / Self Care | Source: Ambulatory Visit | Attending: Vascular Surgery | Admitting: Vascular Surgery

## 2023-10-22 ENCOUNTER — Encounter (HOSPITAL_COMMUNITY): Payer: Self-pay | Admitting: Vascular Surgery

## 2023-10-22 DIAGNOSIS — Z833 Family history of diabetes mellitus: Secondary | ICD-10-CM | POA: Diagnosis not present

## 2023-10-22 DIAGNOSIS — T82898A Other specified complication of vascular prosthetic devices, implants and grafts, initial encounter: Secondary | ICD-10-CM

## 2023-10-22 DIAGNOSIS — Z992 Dependence on renal dialysis: Secondary | ICD-10-CM | POA: Insufficient documentation

## 2023-10-22 DIAGNOSIS — N186 End stage renal disease: Secondary | ICD-10-CM | POA: Diagnosis not present

## 2023-10-22 DIAGNOSIS — Z8249 Family history of ischemic heart disease and other diseases of the circulatory system: Secondary | ICD-10-CM | POA: Insufficient documentation

## 2023-10-22 DIAGNOSIS — E1122 Type 2 diabetes mellitus with diabetic chronic kidney disease: Secondary | ICD-10-CM | POA: Insufficient documentation

## 2023-10-22 DIAGNOSIS — Y832 Surgical operation with anastomosis, bypass or graft as the cause of abnormal reaction of the patient, or of later complication, without mention of misadventure at the time of the procedure: Secondary | ICD-10-CM | POA: Insufficient documentation

## 2023-10-22 DIAGNOSIS — T82838A Hemorrhage of vascular prosthetic devices, implants and grafts, initial encounter: Secondary | ICD-10-CM | POA: Insufficient documentation

## 2023-10-22 DIAGNOSIS — Z87891 Personal history of nicotine dependence: Secondary | ICD-10-CM | POA: Insufficient documentation

## 2023-10-22 DIAGNOSIS — I120 Hypertensive chronic kidney disease with stage 5 chronic kidney disease or end stage renal disease: Secondary | ICD-10-CM | POA: Insufficient documentation

## 2023-10-22 HISTORY — PX: A/V FISTULAGRAM: CATH118298

## 2023-10-22 LAB — GLUCOSE, CAPILLARY: Glucose-Capillary: 98 mg/dL (ref 70–99)

## 2023-10-22 LAB — POCT I-STAT, CHEM 8
BUN: 67 mg/dL — ABNORMAL HIGH (ref 8–23)
Calcium, Ion: 1.07 mmol/L — ABNORMAL LOW (ref 1.15–1.40)
Chloride: 103 mmol/L (ref 98–111)
Creatinine, Ser: 13.2 mg/dL — ABNORMAL HIGH (ref 0.61–1.24)
Glucose, Bld: 126 mg/dL — ABNORMAL HIGH (ref 70–99)
HCT: 33 % — ABNORMAL LOW (ref 39.0–52.0)
Hemoglobin: 11.2 g/dL — ABNORMAL LOW (ref 13.0–17.0)
Potassium: 4.9 mmol/L (ref 3.5–5.1)
Sodium: 137 mmol/L (ref 135–145)
TCO2: 26 mmol/L (ref 22–32)

## 2023-10-22 SURGERY — A/V FISTULAGRAM
Anesthesia: LOCAL

## 2023-10-22 MED ORDER — SODIUM CHLORIDE 0.9% FLUSH
3.0000 mL | INTRAVENOUS | Status: DC | PRN
Start: 2023-10-22 — End: 2023-10-22

## 2023-10-22 MED ORDER — IODIXANOL 320 MG/ML IV SOLN
INTRAVENOUS | Status: DC | PRN
  Administered 2023-10-22: 20 mL

## 2023-10-22 MED ORDER — HEPARIN (PORCINE) IN NACL 1000-0.9 UT/500ML-% IV SOLN
INTRAVENOUS | Status: DC | PRN
  Administered 2023-10-22: 500 mL

## 2023-10-22 MED ORDER — LIDOCAINE HCL (PF) 1 % IJ SOLN
INTRAMUSCULAR | Status: DC | PRN
  Administered 2023-10-22: 10 mL

## 2023-10-22 MED ORDER — LIDOCAINE HCL (PF) 1 % IJ SOLN
INTRAMUSCULAR | Status: AC
  Filled 2023-10-22: qty 30

## 2023-10-22 MED ORDER — LIDOCAINE HCL (PF) 1 % IJ SOLN
INTRAMUSCULAR | Status: AC
Start: 2023-10-22 — End: ?
  Filled 2023-10-22: qty 30

## 2023-10-22 SURGICAL SUPPLY — 6 items
COVER DOME SNAP 22 D (MISCELLANEOUS) ×2 IMPLANT
KIT MICROPUNCTURE NIT STIFF (SHEATH) IMPLANT
SHEATH PROBE COVER 6X72 (BAG) IMPLANT
STOPCOCK MORSE 400PSI 3WAY (MISCELLANEOUS) ×2 IMPLANT
TRAY PV CATH (CUSTOM PROCEDURE TRAY) ×2 IMPLANT
TUBING CIL FLEX 10 FLL-RA (TUBING) IMPLANT

## 2023-10-22 NOTE — Op Note (Signed)
    Patient name: [name] [name] MRN: [id number] DOB: 1951/03/08 Sex: male  10/22/2023 Pre-operative Diagnosis: End-stage renal disease, malfunction left arm AV fistula Post-operative diagnosis:  Same Surgeon:  Luanna Salk. Randie Heinz, MD Procedure Performed: 1.  Ultrasound-guided cannulation of the left upper arm AV fistula 2.  Left upper extremity fistulogram  Indications: 73 year old male with end-stage renal disease and on dialysis Monday, Wednesday and Friday via left upper arm AV fistula.  He has malfunction with increased bleeding and decreased clearance and is indicated for fistulogram with proximal intervention.  There is  Findings: The anastomosis of the fistula was very wide in the proximal fistula where it has been cannulated was pseudoaneurysmal.  Fistula after this is very serpentine with areas of stenosis and the cephalic arch has an area of 80% stenosis.    Given the multiple issues with the fistula we have elected for no intervention today and will plan for revision with likely cephalic vein turndown and transposition of the cephalic vein fistula versus conversion to graft on a nondialysis day in the near future.  . Procedure:  The patient was identified in the holding area and taken to room 8.  The patient was then placed supine on the table and prepped and draped in the usual sterile fashion.  A time out was called.  Ultrasound was used to evaluate the left arm AV fistula.  The area was anesthetized 1% lidocaine and cannulated with direct ultrasound visualization with a micropuncture needle followed by wire and sheath.  An ultrasound image saved of her record.  We then performed left upper extremity fistulogram including retrograde views.  With the above findings we will plan for surgical intervention.  Sheath was removed and the cannulation site was suture-ligated with 4-0 Monocryl.  Patient tolerated the procedure without any complication.   Contrast: 20cc  Kenzi Bardwell C. Randie Heinz,  MD [facility] [place] Office: [phone] Pager: [phone]

## 2023-10-22 NOTE — Discharge Instructions (Signed)
 Activity Rest as told by your health care provider. Return to your normal activities as told by your health care provider. Ask your health care provider what activities are safe for you. May shower and remove bandage 24 hours after discharge. If you were given a sedative during the procedure, it can affect you for several hours. Do not drive or operate machinery until your health care provider says that it is safe. General instructions      Check your site every day for signs of infection. Check for: Redness, swelling, or pain. Fluid or blood. Warmth. Pus or a bad smell. Take over-the-counter and prescription medicines only as told by your health care provider. Contact a health care provider if: You have redness, swelling, warmth, pus, or pain around the site.

## 2023-10-22 NOTE — Telephone Encounter (Signed)
 Attempted to reach pt to schedule his surgery. Left VM for him to return our call.

## 2023-10-22 NOTE — H&P (Signed)
 HPI:   [name] [name] is a 73 y.o. male recently peritoneal dialysis catheter removal and subsequent venoplasty of 3 separate lesions within the fistula and he is now here for follow-up.  [birth year] actually [birth year].  He states that his fistula has been working well only occasionally has prolonged bleeding after dialysis but that does not happen today with dialysis and he is having good clearance as well.  He really has no complaints related to today's visit.       Past Medical History:  Diagnosis Date   Anemia     Arthritis     Asthma     Bell palsy     CAD (coronary artery disease)      a. 2014 MV: abnl w/ infap ischemia; b. 03/2013 Cath: aneurysmal bleb in the LAD w/ otw nonobs dzs-->Med Rx.   Chronic back pain     Chronic knee pain      a. 09/2015 s/p R TKA.   Chronic pain     Chronic shoulder pain     Chronic sinusitis     COPD (chronic obstructive pulmonary disease) (HCC)     Diabetes mellitus without complication (HCC)      type II    ESRD on peritoneal dialysis (HCC)      on peritoneal dialysis, [facility]   Essential hypertension     GERD (gastroesophageal reflux disease)     Gout     Hepatomegaly      noted on noncontrast CT 2015   History of hiatal hernia     Hyperlipidemia     Lateral meniscus tear     Obesity      Truncal   Obstructive sleep apnea      does not use cpap    On home oxygen therapy      uses 2l when is going somewhere per patient    PUD (peptic ulcer disease)      remote, reports f/u EGD about 8 years ago unremarkable    Reactive airway disease      related to exposure to chemical during 9/11   Sinusitis     Vitamin D deficiency               Family History  Problem Relation Age of Onset   Hypertension Mother          MI   Cancer Mother          breast    Diabetes Mother     Diabetes Father     Hypertension Father     Hypertension Sister     Diabetes Sister     Arthritis Other     Asthma Other     Lung disease Other      Anesthesia problems Neg Hx     Hypotension Neg Hx     Malignant hyperthermia Neg Hx     Pseudochol deficiency Neg Hx     Colon cancer Neg Hx               Past Surgical History:  Procedure Laterality Date   A/V FISTULAGRAM N/A 02/28/2023    Procedure: A/V Fistulagram;  Surgeon: Victorino Sparrow, MD;  Location: [facility] INVASIVE CV LAB;  Service: Cardiovascular;  Laterality: N/A;   ASAD LT SHOULDER   12/15/2008    left shoulder   AV FISTULA PLACEMENT Left 08/09/2016    Procedure: BRACHIOCEPHALIC ARTERIOVENOUS (AV) FISTULA CREATION LEFT ARM;  Surgeon: Sherren Kerns,  MD;  Location: [facility] OR;  Service: Vascular;  Laterality: Left;   CAPD INSERTION N/A 10/07/2018    Procedure: LAPAROSCOPIC PERITONEAL CATHETER PLACEMENT;  Surgeon: Sheliah Hatch, De Blanch, MD;  Location: [facility] ORS;  Service: General;  Laterality: N/A;   CATARACT EXTRACTION W/PHACO Left 03/28/2016    Procedure: CATARACT EXTRACTION PHACO AND INTRAOCULAR LENS PLACEMENT LEFT EYE;  Surgeon: Jethro Bolus, MD;  Location: [facility] ORS;  Service: Ophthalmology;  Laterality: Left;  CDE: 4.77   CATARACT EXTRACTION W/PHACO Right 04/11/2016    Procedure: CATARACT EXTRACTION PHACO AND INTRAOCULAR LENS PLACEMENT RIGHT EYE; CDE:  4.74;  Surgeon: Jethro Bolus, MD;  Location: [facility] ORS;  Service: Ophthalmology;  Laterality: Right;   COLONOSCOPY   10/15/2008    Fields: Rectal polyp obliterated, not retrieved, hemorrhoids, single ascending colon diverticulum near the CV. Next colonoscopy April 2020   COLONOSCOPY N/A 12/25/2014    SLF: 1. Colorectal polyps (2) removed 2. Small internal hemorrhoids 3. the left colon is severely redundant. hyperplastic polyps   CORONARY ARTERY BYPASS GRAFT N/A 06/05/2022    Procedure: OFF PUMP CORONARY ARTERY BYPASS GRAFTING (CABG) X 2 BYPASSES USING LEFT INTERNAL MAMMARY ARTERY AND RIGHT LEG GREATER SAPHENOUS VEIN HARVESTED ENDOSCOPICALLY;  Surgeon: Corliss Skains, MD;  Location: [facility] OR;  Service: Open Heart Surgery;   Laterality: N/A;   CORONARY STENT INTERVENTION N/A 07/25/2021    Procedure: CORONARY STENT INTERVENTION;  Surgeon: Tonny Bollman, MD;  Location: [facility] INVASIVE CV LAB;  Service: Cardiovascular;  Laterality: N/A;   CORONARY STENT INTERVENTION N/A 12/26/2021    Procedure: CORONARY STENT INTERVENTION;  Surgeon: Yvonne Kendall, MD;  Location: [facility] INVASIVE CV LAB;  Service: Cardiovascular;  Laterality: N/A;   CORONARY STENT INTERVENTION N/A 01/20/2022    Procedure: CORONARY STENT INTERVENTION;  Surgeon: Tonny Bollman, MD;  Location: [facility] INVASIVE CV LAB;  Service: Cardiovascular;  Laterality: N/A;   DOPPLER ECHOCARDIOGRAPHY       ESOPHAGOGASTRODUODENOSCOPY N/A 12/25/2014    SLF: 1. Anemia most likely due to CRI, gastritis, gastric polyps 2. Moderate non-erosive gastriits and mild duodenitis.  3.TWo large gstric polyps removed.    EYE SURGERY   12/22/2010    tear duct probing-[place]   FOREIGN BODY REMOVAL   03/29/2011    Procedure: REMOVAL FOREIGN BODY EXTREMITY;  Surgeon: Fuller Canada, MD;  Location: [facility];  Service: Orthopedics;  Laterality: Right;  Removal Foreign Body Right Thumb   IR FLUORO GUIDE CV LINE RIGHT   08/06/2018   IR FLUORO GUIDE CV LINE RIGHT   02/17/2023   IR US GUIDE VASC ACCESS RIGHT   08/06/2018   IR US GUIDE VASC ACCESS RIGHT   02/17/2023   KNEE ARTHROSCOPY   10/16/2007    left   KNEE ARTHROSCOPY WITH LATERAL MENISECTOMY Right 10/14/2015    Procedure: LEFT KNEE ARTHROSCOPY WITH PARTIAL LATERAL MENISECTOMY;  Surgeon: Vickki Hearing, MD;  Location: [facility];  Service: Orthopedics;  Laterality: Right;   LEFT HEART CATH AND CORONARY ANGIOGRAPHY N/A 07/25/2021    Procedure: LEFT HEART CATH AND CORONARY ANGIOGRAPHY;  Surgeon: Tonny Bollman, MD;  Location: [facility] INVASIVE CV LAB;  Service: Cardiovascular;  Laterality: N/A;   LEFT HEART CATH AND CORONARY ANGIOGRAPHY N/A 12/26/2021    Procedure: LEFT HEART CATH AND CORONARY ANGIOGRAPHY;  Surgeon: Yvonne Kendall, MD;  Location: [facility]  INVASIVE CV LAB;  Service: Cardiovascular;  Laterality: N/A;   LEFT HEART CATH AND CORONARY ANGIOGRAPHY N/A 01/20/2022    Procedure: LEFT HEART CATH AND CORONARY ANGIOGRAPHY;  Surgeon: Tonny Bollman, MD;  Location: [facility] INVASIVE CV LAB;  Service: Cardiovascular;  Laterality: N/A;   LEFT HEART CATHETERIZATION WITH CORONARY ANGIOGRAM N/A 03/28/2013    Procedure: LEFT HEART CATHETERIZATION WITH CORONARY ANGIOGRAM;  Surgeon: Marykay Lex, MD;  Location: [facility] CATH LAB;  Service: Cardiovascular;  Laterality: N/A;   NM MYOVIEW LTD       PENILE PROSTHESIS IMPLANT N/A 08/16/2015    Procedure: PENILE PROTHESIS INFLATABLE, three piece, Excisional biopsy of Penile ulcer, Penile molding;  Surgeon: Jethro Bolus, MD;  Location: [facility]S;  Service: Urology;  Laterality: N/A;   PENILE PROSTHESIS IMPLANT N/A 12/24/2017    Procedure: REMOVAL AND  REPLACEMENT  COLOPLAST PENILE PROSTHESIS;  Surgeon: Crista Elliot, MD;  Location: [facility];  Service: Urology;  Laterality: N/A;   PERIPHERAL VASCULAR BALLOON ANGIOPLASTY   02/28/2023    Procedure: PERIPHERAL VASCULAR BALLOON ANGIOPLASTY;  Surgeon: Victorino Sparrow, MD;  Location: [facility] INVASIVE CV LAB;  Service: Cardiovascular;;   PORT-A-CATH REMOVAL N/A 02/19/2023    Procedure: REMOVAL OF PERITONEAL DIALYSIS CATHETER;  Surgeon: Victorino Sparrow, MD;  Location: [facility] OR;  Service: Vascular;  Laterality: N/A;   QUADRICEPS TENDON REPAIR   07/21/2011    Procedure: REPAIR QUADRICEP TENDON;  Surgeon: Fuller Canada, MD;  Location: [facility]S;  Service: Orthopedics;  Laterality: Right;   RIGHT/LEFT HEART CATH AND CORONARY ANGIOGRAPHY N/A 05/30/2022    Procedure: RIGHT/LEFT HEART CATH AND CORONARY ANGIOGRAPHY;  Surgeon: Corky Crafts, MD;  Location: [facility] INVASIVE CV LAB;  Service: Cardiovascular;  Laterality: N/A;   TEE WITHOUT CARDIOVERSION N/A 06/05/2022    Procedure: TRANSESOPHAGEAL ECHOCARDIOGRAM (TEE);  Surgeon: Corliss Skains, MD;  Location: [facility] OR;  Service: Open  Heart Surgery;  Laterality: N/A;   TOENAIL EXCISION        removed x2-bilateral   UMBILICAL HERNIA REPAIR   07/17/2005    [place]          Short Social History:  Social History         Tobacco Use   Smoking status: Former      Current packs/day: 0.00      Average packs/day: 1 pack/day for 25.0 years (25.0 ttl pk-yrs)      Types: Cigarettes      Start date: 03/27/1985      Quit date: 03/27/2010      Years since quitting: 13.2   Smokeless tobacco: Never   Tobacco comments:      Quit x 7 years  Substance Use Topics   Alcohol use: Not Currently      Comment: occasionally      Allergies      Allergies  Allergen Reactions   Opana [Oxymorphone Hcl] Itching   Tramadol Itching              Current Outpatient Medications  Medication Sig Dispense Refill   albuterol (VENTOLIN HFA) 108 (90 Base) MCG/ACT inhaler Inhale 2 puffs into the lungs every 6 (six) hours as needed for wheezing or shortness of breath. 18 g 11   amLODipine (NORVASC) 5 MG tablet Take 1 tablet (5 mg total) by mouth daily. 30 tablet 0   ARIPiprazole (ABILIFY) 10 MG tablet Take 10 mg by mouth at bedtime.       ascorbic acid (VITAMIN C) 500 MG tablet Take 1 tablet (500 mg total) by mouth daily. 100 tablet 0   aspirin EC 81 MG tablet Take 81 mg by mouth daily. Swallow whole.       atorvastatin (LIPITOR) 80 MG  tablet Take 1 tablet (80 mg total) by mouth daily. 30 tablet 1   Darbepoetin Alfa (ARANESP) 150 MCG/0.3ML SOSY injection Inject 0.3 mLs (150 mcg total) into the skin every Sunday at 6pm.       Darbepoetin Alfa-Albumin (ARANESP IJ) Inject as directed. Done at HD       esomeprazole (NEXIUM) 40 MG capsule Take 40 mg by mouth 2 (two) times daily before a meal.       fluticasone furoate-vilanterol (BREO ELLIPTA) 200-25 MCG/ACT AEPB Use 1 (one) inhaltion once a day. 60 each 0   hydrocortisone (ANUSOL-HC) 2.5 % rectal cream Place 1 Application rectally 2 (two) times daily. (Patient taking differently: Place 1  Application rectally as needed.) 30 g 1   Nystatin (GERHARDT'S BUTT CREAM) CREA Apply 1 Application topically 4 (four) times daily.          No current facility-administered medications for this visit.        Review of Systems  Constitutional:  Constitutional negative. HENT: HENT negative.  Eyes: Eyes negative.  Respiratory: Respiratory negative.  Cardiovascular: Cardiovascular negative.  GI: Gastrointestinal negative.  Musculoskeletal: Musculoskeletal negative.  Skin: Skin negative.  Neurological: Neurological negative. Hematologic:       Occasional increased bleeding after dialysis Psychiatric: Psychiatric negative.          Objective:   Vitals:   10/22/23 0705  BP: (!) 149/87  Pulse: 61  Resp: 18  Temp: 97.8 F (36.6 C)  SpO2: 90%    Physical Exam HENT:     Head: Normocephalic.     Nose: Nose normal.  Eyes:     Pupils: Pupils are equal, round, and reactive to light.  Pulmonary:     Effort: Pulmonary effort is normal.  Abdominal:     General: Abdomen is flat.  Musculoskeletal:        General: Normal range of motion.     Cervical back: Normal range of motion.     Right lower leg: No edema.     Left lower leg: No edema.     Comments: Left arm AV fistula has pulsatility peripherally with more of a thrill palpable up to the shoulder and centrally  Skin:    General: Skin is warm.     Capillary Refill: Capillary refill takes less than 2 seconds.  Neurological:     General: No focal deficit present.     Mental Status: He is alert.  Psychiatric:        Mood and Affect: Mood normal.        Thought Content: Thought content normal.        Data: No studies      Assessment/Plan:    73 year old male with end-stage renal disease with continued malfunction of left upper arm AV fistula.  Plans for fistulogram today and we discussed likely need for surgical intervention with possible transposition and possible cephalic vein turndown.  Questions were answered and  consent was signed.   Soham Hollett C. Randie Heinz, MD [facility] [place] Office: [phone] Pager: [phone]

## 2023-10-24 DIAGNOSIS — Z992 Dependence on renal dialysis: Secondary | ICD-10-CM | POA: Diagnosis not present

## 2023-10-24 DIAGNOSIS — D631 Anemia in chronic kidney disease: Secondary | ICD-10-CM | POA: Diagnosis not present

## 2023-10-24 DIAGNOSIS — N186 End stage renal disease: Secondary | ICD-10-CM | POA: Diagnosis not present

## 2023-10-24 DIAGNOSIS — D509 Iron deficiency anemia, unspecified: Secondary | ICD-10-CM | POA: Diagnosis not present

## 2023-10-24 DIAGNOSIS — N2581 Secondary hyperparathyroidism of renal origin: Secondary | ICD-10-CM | POA: Diagnosis not present

## 2023-10-25 DIAGNOSIS — B351 Tinea unguium: Secondary | ICD-10-CM | POA: Diagnosis not present

## 2023-10-25 DIAGNOSIS — E1142 Type 2 diabetes mellitus with diabetic polyneuropathy: Secondary | ICD-10-CM | POA: Diagnosis not present

## 2023-10-26 DIAGNOSIS — D509 Iron deficiency anemia, unspecified: Secondary | ICD-10-CM | POA: Diagnosis not present

## 2023-10-26 DIAGNOSIS — N2581 Secondary hyperparathyroidism of renal origin: Secondary | ICD-10-CM | POA: Diagnosis not present

## 2023-10-26 DIAGNOSIS — N186 End stage renal disease: Secondary | ICD-10-CM | POA: Diagnosis not present

## 2023-10-26 DIAGNOSIS — D631 Anemia in chronic kidney disease: Secondary | ICD-10-CM | POA: Diagnosis not present

## 2023-10-26 DIAGNOSIS — Z992 Dependence on renal dialysis: Secondary | ICD-10-CM | POA: Diagnosis not present

## 2023-10-29 DIAGNOSIS — N186 End stage renal disease: Secondary | ICD-10-CM | POA: Diagnosis not present

## 2023-10-29 DIAGNOSIS — D631 Anemia in chronic kidney disease: Secondary | ICD-10-CM | POA: Diagnosis not present

## 2023-10-29 DIAGNOSIS — Z992 Dependence on renal dialysis: Secondary | ICD-10-CM | POA: Diagnosis not present

## 2023-10-29 DIAGNOSIS — N2581 Secondary hyperparathyroidism of renal origin: Secondary | ICD-10-CM | POA: Diagnosis not present

## 2023-10-29 DIAGNOSIS — D509 Iron deficiency anemia, unspecified: Secondary | ICD-10-CM | POA: Diagnosis not present

## 2023-10-31 ENCOUNTER — Ambulatory Visit: Payer: Medicare Other | Admitting: Gastroenterology

## 2023-10-31 DIAGNOSIS — N186 End stage renal disease: Secondary | ICD-10-CM | POA: Diagnosis not present

## 2023-10-31 DIAGNOSIS — D631 Anemia in chronic kidney disease: Secondary | ICD-10-CM | POA: Diagnosis not present

## 2023-10-31 DIAGNOSIS — Z992 Dependence on renal dialysis: Secondary | ICD-10-CM | POA: Diagnosis not present

## 2023-10-31 DIAGNOSIS — N2581 Secondary hyperparathyroidism of renal origin: Secondary | ICD-10-CM | POA: Diagnosis not present

## 2023-10-31 DIAGNOSIS — D509 Iron deficiency anemia, unspecified: Secondary | ICD-10-CM | POA: Diagnosis not present

## 2023-11-01 ENCOUNTER — Encounter: Payer: Self-pay | Admitting: Nurse Practitioner

## 2023-11-01 ENCOUNTER — Ambulatory Visit (INDEPENDENT_AMBULATORY_CARE_PROVIDER_SITE_OTHER): Payer: Medicare Other | Admitting: Nurse Practitioner

## 2023-11-01 VITALS — BP 113/67 | HR 51 | Ht 63.5 in | Wt 216.5 lb

## 2023-11-01 DIAGNOSIS — I5020 Unspecified systolic (congestive) heart failure: Secondary | ICD-10-CM

## 2023-11-01 DIAGNOSIS — J4540 Moderate persistent asthma, uncomplicated: Secondary | ICD-10-CM

## 2023-11-01 DIAGNOSIS — G4733 Obstructive sleep apnea (adult) (pediatric): Secondary | ICD-10-CM

## 2023-11-01 NOTE — Assessment & Plan Note (Signed)
 Not in acute exacerbation. Lung exam is clear. He is compliant with high dose ICS/LABA but still requires use of SABA often. Will trial him on step up to triple therapy with Trelegy and reassess response. Teachback and side effect profile reviewed. Samples provided today. Will update his rx if he has perceived benefit. I do suspect his DOE is multifactorial related to significant cardiac disease, ESRD, deconditioning and body habitus. Action plan in place. Encouraged to work on graded exercises.  Patient Instructions  Stop Breo. Trial step up to Trelegy 1 puff daily. Brush tongue and rinse mouth afterwards. Let me know if you feel like this helps and I can send in an updated prescription  Continue Albuterol inhaler 2 puffs every 6 hours as needed for shortness of breath or wheezing. Notify if symptoms persist despite rescue inhaler/neb use.   Take your CPAP to [facility] so we can get your machine fixed and get you back on therapy  Restart CPAP every night, minimum of 4-6 hours a night.  Change equipment as directed. Wash your tubing with warm soap and water daily, hang to dry. Wash humidifier portion weekly. Use bottled, distilled water and change daily Be aware of reduced alertness and do not drive or operate heavy machinery if experiencing this or drowsiness.  Exercise encouraged, as tolerated. Healthy weight management discussed.  Avoid or decrease alcohol consumption and medications that make you more sleepy, if possible. Notify if persistent daytime sleepiness occurs even with consistent use of PAP therapy.  We discussed how untreated sleep apnea puts an individual at risk for cardiac arrhthymias, pulm HTN, DM, stroke and increases their risk for daytime accidents.   Follow up in 6 weeks with Dr. Villa Greaser or Gina Lagos. If symptoms do not improve or worsen, please contact office for sooner follow up or seek emergency care.

## 2023-11-01 NOTE — Assessment & Plan Note (Signed)
 Moderate OSA, currently off CPAP due to machine issues. Contact [facility] who will get in touch with pt to set an appt for troubleshooting. Encouraged him to resume therapy once machine is fixed. Risks of untreated OSA reviewed. Aware of proper care/use. Healthy weight loss encouraged. Safe driving practices reviewed.

## 2023-11-01 NOTE — Patient Instructions (Addendum)
 Stop Breo. Trial step up to Trelegy 1 puff daily. Brush tongue and rinse mouth afterwards. Let me know if you feel like this helps and I can send in an updated prescription  Continue Albuterol inhaler 2 puffs every 6 hours as needed for shortness of breath or wheezing. Notify if symptoms persist despite rescue inhaler/neb use.   Take your CPAP to [facility] so we can get your machine fixed and get you back on therapy  Restart CPAP every night, minimum of 4-6 hours a night.  Change equipment as directed. Wash your tubing with warm soap and water daily, hang to dry. Wash humidifier portion weekly. Use bottled, distilled water and change daily Be aware of reduced alertness and do not drive or operate heavy machinery if experiencing this or drowsiness.  Exercise encouraged, as tolerated. Healthy weight management discussed.  Avoid or decrease alcohol consumption and medications that make you more sleepy, if possible. Notify if persistent daytime sleepiness occurs even with consistent use of PAP therapy.  We discussed how untreated sleep apnea puts an individual at risk for cardiac arrhthymias, pulm HTN, DM, stroke and increases their risk for daytime accidents.   Follow up in 6 weeks with Dr. Villa Greaser or Gina Lagos. If symptoms do not improve or worsen, please contact office for sooner follow up or seek emergency care.

## 2023-11-01 NOTE — Progress Notes (Signed)
 @Patient  ID: [name] [name], male    DOB: [birth year], 73 y.o.   MRN: [id number]  Chief Complaint  Patient presents with   Sleep Apnea    Referring provider: Wyvonna Heidelberg*  HPI: 73 year old male, former smoker followed for OSA on CPAP and asthma. He is a patient of Dr. Hortense Lyons and last seen in office 11/16/2022. Past medical history significant for ESRD on HD, CAD s/p CABG x2, HFrEF, HTN, DM, hx of NSTEMI, PAF, GERD, hepatic cirrhosis, CKD, HLD, dementia, obesity.   TEST/EVENTS:  12/29/2019 PFT: FVC 58, FEV1 57, ratio 84, TLC 82, DLCO 81. Reduced lung function without formal obstruction or restriction. Normal DLCO 08/2020 HST: AHI 21/h 08/15/2021 CT chest: clear lungs  11/16/2022: OV with Dr. Villa Greaser. Very compliant with CPAP. Has decreased somnolence and fatigue. Requested download from [employer]. Compliance is sometimes impacted by cough. Increased productive cough with brown phlegm. Prescribed z pack. Advised to continue symbicort and use mucinex.   11/01/2023: Today - follow up Patient presents today with his wife for follow up. He has a history of asthma and is currently using Breo. He also uses albuterol twice daily for shortness of breath, particularly in the morning and during the day. He feels better since his last visit. No cough, chest congestion, wheezing.   He has been experiencing issues with his CPAP machine, which he has not used for at least six months due to excessive water being pushed into his nose. The machine is relatively new, approximately 92-7 years old, and he uses nasal pillows. Since discontinuing CPAP use, he has felt more tired, and there is some snoring and occasional apneas, though not as frequent as before CPAP use according to his wife. He denies any drowsy driving.  He is on dialysis three times a week on Monday, Wednesday, and Friday.      Allergies  Allergen Reactions   Tramadol Itching   Opana [Oxymorphone Hcl] Itching    Immunization  History  Administered Date(s) Administered   Fluad Quad(high Dose 65+) 04/16/2022   Influenza, High Dose Seasonal PF 09/12/2018, 04/17/2019, 05/11/2020   Influenza-Unspecified 09/12/2018, 04/01/2019, 04/22/2021   Moderna Sars-Covid-2 Vaccination 09/18/2019, 10/16/2019, 05/24/2020   PPD Test 07/19/2018, 09/07/2018, 11/18/2019   Pneumococcal Polysaccharide-23 09/13/2018, 05/03/2021   Td 03/27/2011    Past Medical History:  Diagnosis Date   Anemia    Arthritis    Asthma    Bell palsy    CAD (coronary artery disease)    a. 2014 MV: abnl w/ infap ischemia; b. 03/2013 Cath: aneurysmal bleb in the LAD w/ otw nonobs dzs-->Med Rx.   Chronic back pain    Chronic knee pain    a. 09/2015 s/p R TKA.   Chronic pain    Chronic shoulder pain    Chronic sinusitis    COPD (chronic obstructive pulmonary disease) (HCC)    Diabetes mellitus without complication (HCC)    type II    ESRD on peritoneal dialysis (HCC)    on peritoneal dialysis, [facility]   Essential hypertension    GERD (gastroesophageal reflux disease)    Gout    Hepatomegaly    noted on noncontrast CT 2015   History of hiatal hernia    Hyperlipidemia    Lateral meniscus tear    Obesity    Truncal   Obstructive sleep apnea    does not use cpap    On home oxygen therapy    uses 2l when is going somewhere  per patient    PUD (peptic ulcer disease)    remote, reports f/u EGD about 8 years ago unremarkable    Reactive airway disease    related to exposure to chemical during 9/11   Sinusitis    Vitamin D deficiency     Tobacco History: Social History   Tobacco Use  Smoking Status Former   Current packs/day: 0.00   Average packs/day: 1 pack/day for 25.0 years (25.0 ttl pk-yrs)   Types: Cigarettes   Start date: 03/27/1985   Quit date: 03/27/2010   Years since quitting: 13.6  Smokeless Tobacco Never  Tobacco Comments   Quit x 7 years   Counseling given: Not Answered Tobacco comments: Quit x 7  years   Outpatient Medications Prior to Visit  Medication Sig Dispense Refill   acetaminophen (TYLENOL) 650 MG CR tablet Take 1,300 mg by mouth every 8 (eight) hours as needed for pain.     albuterol (VENTOLIN HFA) 108 (90 Base) MCG/ACT inhaler Inhale 2 puffs into the lungs every 6 (six) hours as needed for wheezing or shortness of breath. 18 g 11   amLODipine (NORVASC) 5 MG tablet Take 1 tablet (5 mg total) by mouth daily. 30 tablet 5   ascorbic acid (VITAMIN C) 500 MG tablet Take 1 tablet (500 mg total) by mouth daily. 100 tablet 0   aspirin EC 81 MG tablet Take 81 mg by mouth daily. Swallow whole.     atorvastatin (LIPITOR) 80 MG tablet Take 1 tablet (80 mg total) by mouth daily. 30 tablet 1   benzonatate (TESSALON) 100 MG capsule Take 100 mg by mouth daily.     esomeprazole (NEXIUM) 40 MG capsule Take 1 capsule (40 mg total) by mouth 2 (two) times daily before a meal. 180 capsule 0   ferric citrate (AURYXIA) 1 GM 210 MG(Fe) tablet Take 420 mg by mouth 3 (three) times daily with meals.     fluticasone furoate-vilanterol (BREO ELLIPTA) 200-25 MCG/ACT AEPB Use 1 (one) inhaltion once a day. 60 each 5   furosemide (LASIX) 80 MG tablet Take 80 mg by mouth 4 (four) times a week. Tues, Thurs, Sat, Sun (non-dialysis days)     tamsulosin (FLOMAX) 0.4 MG CAPS capsule Take 0.4 mg by mouth daily after supper.     No facility-administered medications prior to visit.     Review of Systems:   Constitutional: No weight loss or gain, night sweats, fevers, chills, or lassitude. +fatigue  HEENT: No headaches, difficulty swallowing, tooth/dental problems, or sore throat. No sneezing, itching, ear ache, nasal congestion, or post nasal drip CV:  No chest pain, orthopnea, PND, swelling in lower extremities, anasarca, dizziness, palpitations, syncope Resp: +snoring, witnessed apneas; shortness of breath with exertion. No excess mucus or change in color of mucus. No productive or non-productive. No hemoptysis.  No wheezing.  No chest wall deformity GI:  No heartburn, indigestion, abdominal pain, nausea, vomiting, diarrhea, change in bowel habits, loss of appetite, bloody stools.  Skin: No rash, lesions, ulcerations MSK:  No joint pain or swelling.   Neuro: No dizziness or lightheadedness.  Psych: No depression or anxiety. Mood stable.     Physical Exam:  BP 113/67   Pulse (!) 51   Ht 5' 3.5" (1.613 m)   Wt 216 lb 8 oz (98.2 kg)   SpO2 92%   BMI 37.75 kg/m   GEN: Pleasant, interactive, well-kempt; obese; in no acute distress HEENT:  Normocephalic and atraumatic. PERRLA. Sclera white. Nasal turbinates pink, moist  and patent bilaterally. No rhinorrhea present. Oropharynx pink and moist, without exudate or edema. No lesions, ulcerations, or postnasal drip. Mallampati III/IV NECK:  Supple w/ fair ROM. No JVD present. Normal carotid impulses w/o bruits. Thyroid symmetrical with no goiter or nodules palpated. No lymphadenopathy.   CV: RRR, no m/r/g, no peripheral edema. Pulses intact, +2 bilaterally. No cyanosis, pallor or clubbing. PULMONARY:  Unlabored, regular breathing. Clear bilaterally A&P w/o wheezes/rales/rhonchi. No accessory muscle use.  GI: BS present and normoactive. Soft, non-tender to palpation. No organomegaly or masses detected.  MSK: No erythema, warmth or tenderness. Cap refil <2 sec all extrem.  Neuro: A/Ox3. No focal deficits noted.   Skin: Warm, no lesions or rashe Psych: Normal affect and behavior. Judgement and thought content appropriate.     Lab Results:  CBC    Component Value Date/Time   WBC 3.4 (L) 09/20/2023 0845   RBC 3.31 (L) 09/20/2023 0845   HGB 11.2 (L) 10/22/2023 0711   HGB 11.3 (L) 12/22/2021 1017   HCT 33.0 (L) 10/22/2023 0711   HCT 33.7 (L) 12/22/2021 1017   PLT 125 (L) 09/20/2023 0845   PLT 117 (L) 12/22/2021 1017   MCV 88.8 09/20/2023 0845   MCV 91 12/22/2021 1017   MCH 26.3 09/20/2023 0845   MCHC 29.6 (L) 09/20/2023 0845   RDW 21.1 (H)  09/20/2023 0845   RDW 13.7 12/22/2021 1017   LYMPHSABS 0.5 (L) 09/20/2023 0845   MONOABS 0.4 09/20/2023 0845   EOSABS 0.2 09/20/2023 0845   BASOSABS 0.0 09/20/2023 0845    BMET    Component Value Date/Time   NA 137 10/22/2023 0711   NA 144 05/16/2023 0918   K 4.9 10/22/2023 0711   CL 103 10/22/2023 0711   CO2 25 09/20/2023 0845   GLUCOSE 126 (H) 10/22/2023 0711   BUN 67 (H) 10/22/2023 0711   BUN 40 (H) 05/16/2023 0918   CREATININE 13.20 (H) 10/22/2023 0711   CREATININE 18.89 (H) 02/09/2020 1126   CALCIUM 8.1 09/24/2023 0000   CALCIUM 9.2 05/05/2023 0750   GFRNONAA 7 (L) 09/20/2023 0845   GFRNONAA 2 (L) 02/09/2020 1126   GFRAA <4 (L) 02/09/2020 1126    BNP    Component Value Date/Time   BNP 2,260.0 (H) 02/19/2023 0459     Imaging:  PERIPHERAL VASCULAR CATHETERIZATION Result Date: 10/22/2023 Images from the original result were not included. Patient name: [name] [name] MRN: [id number] DOB: [birth year] Sex: male 10/22/2023 Pre-operative Diagnosis: End-stage renal disease, malfunction left arm AV fistula Post-operative diagnosis:  Same Surgeon:  Luanna Salk. Randie Heinz, MD Procedure Performed: 1.  Ultrasound-guided cannulation of the left upper arm AV fistula 2.  Left upper extremity fistulogram Indications: 73 year old male with end-stage renal disease and on dialysis Monday, Wednesday and Friday via left upper arm AV fistula.  He has malfunction with increased bleeding and decreased clearance and is indicated for fistulogram with proximal intervention.  There is Findings: The anastomosis of the fistula was very wide in the proximal fistula where it has been cannulated was pseudoaneurysmal.  Fistula after this is very serpentine with areas of stenosis and the cephalic arch has an area of 80% stenosis.  Given the multiple issues with the fistula we have elected for no intervention today and will plan for revision with likely cephalic vein turndown and transposition of the cephalic vein fistula  versus conversion to graft on a nondialysis day in the near future.  . Procedure:  The patient was identified in the holding area  and taken to room 8.  The patient was then placed supine on the table and prepped and draped in the usual sterile fashion.  A time out was called.  Ultrasound was used to evaluate the left arm AV fistula.  The area was anesthetized 1% lidocaine and cannulated with direct ultrasound visualization with a micropuncture needle followed by wire and sheath.  An ultrasound image saved of her record.  We then performed left upper extremity fistulogram including retrograde views.  With the above findings we will plan for surgical intervention.  Sheath was removed and the cannulation site was suture-ligated with 4-0 Monocryl.  Patient tolerated the procedure without any complication. Contrast: 20cc Brandon C. Randie Heinz, MD [facility] Office: [phone] Pager: [phone]   Administration History     None          Latest Ref Rng & Units 12/29/2019   10:43 AM  PFT Results  FVC-Pre L 1.82   FVC-Predicted Pre % 58   FVC-Post L 1.46   FVC-Predicted Post % 46   Pre FEV1/FVC % % 74   Post FEV1/FCV % % 84   FEV1-Pre L 1.36   FEV1-Predicted Pre % 57   FEV1-Post L 1.23   DLCO uncorrected ml/min/mmHg 18.03   DLCO UNC% % 81   DLCO corrected ml/min/mmHg 18.03   DLCO COR %Predicted % 81   DLVA Predicted % 119   TLC L 4.97   TLC % Predicted % 82   RV % Predicted % 120     No results found for: "NITRICOXIDE"      Assessment & Plan:   Asthma Not in acute exacerbation. Lung exam is clear. He is compliant with high dose ICS/LABA but still requires use of SABA often. Will trial him on step up to triple therapy with Trelegy and reassess response. Teachback and side effect profile reviewed. Samples provided today. Will update his rx if he has perceived benefit. I do suspect his DOE is multifactorial related to significant cardiac disease, ESRD,  deconditioning and body habitus. Action plan in place. Encouraged to work on graded exercises.  Patient Instructions  Stop Breo. Trial step up to Trelegy 1 puff daily. Brush tongue and rinse mouth afterwards. Let me know if you feel like this helps and I can send in an updated prescription  Continue Albuterol inhaler 2 puffs every 6 hours as needed for shortness of breath or wheezing. Notify if symptoms persist despite rescue inhaler/neb use.   Take your CPAP to [facility] so we can get your machine fixed and get you back on therapy  Restart CPAP every night, minimum of 4-6 hours a night.  Change equipment as directed. Wash your tubing with warm soap and water daily, hang to dry. Wash humidifier portion weekly. Use bottled, distilled water and change daily Be aware of reduced alertness and do not drive or operate heavy machinery if experiencing this or drowsiness.  Exercise encouraged, as tolerated. Healthy weight management discussed.  Avoid or decrease alcohol consumption and medications that make you more sleepy, if possible. Notify if persistent daytime sleepiness occurs even with consistent use of PAP therapy.  We discussed how untreated sleep apnea puts an individual at risk for cardiac arrhthymias, pulm HTN, DM, stroke and increases their risk for daytime accidents.   Follow up in 6 weeks with Dr. Vassie Loll or Philis Nettle. If symptoms do not improve or worsen, please contact office for sooner follow up or seek emergency care.  OSA on CPAP Moderate OSA, currently off CPAP due to machine issues. Contact [facility] who will get in touch with pt to set an appt for troubleshooting. Encouraged him to resume therapy once machine is fixed. Risks of untreated OSA reviewed. Aware of proper care/use. Healthy weight loss encouraged. Safe driving practices reviewed.   HFrEF (heart failure with reduced ejection fraction) (HCC) Euvolemic on exam today. Follow up with cardiology as scheduled   Advised if  symptoms do not improve or worsen, to please contact office for sooner follow up or seek emergency care.   I spent 35 minutes of dedicated to the care of this patient on the date of this encounter to include pre-visit review of records, face-to-face time with the patient discussing conditions above, post visit ordering of testing, clinical documentation with the electronic health record, making appropriate referrals as documented, and communicating necessary findings to members of the patients care team.  Roetta Clarke, NP 11/01/2023  Pt aware and understands NP's role.

## 2023-11-01 NOTE — Assessment & Plan Note (Signed)
Euvolemic on exam today.  Follow-up with cardiology as scheduled. 

## 2023-11-02 DIAGNOSIS — N2581 Secondary hyperparathyroidism of renal origin: Secondary | ICD-10-CM | POA: Diagnosis not present

## 2023-11-02 DIAGNOSIS — Z992 Dependence on renal dialysis: Secondary | ICD-10-CM | POA: Diagnosis not present

## 2023-11-02 DIAGNOSIS — D509 Iron deficiency anemia, unspecified: Secondary | ICD-10-CM | POA: Diagnosis not present

## 2023-11-02 DIAGNOSIS — N186 End stage renal disease: Secondary | ICD-10-CM | POA: Diagnosis not present

## 2023-11-02 DIAGNOSIS — D631 Anemia in chronic kidney disease: Secondary | ICD-10-CM | POA: Diagnosis not present

## 2023-11-05 DIAGNOSIS — N2581 Secondary hyperparathyroidism of renal origin: Secondary | ICD-10-CM | POA: Diagnosis not present

## 2023-11-05 DIAGNOSIS — D631 Anemia in chronic kidney disease: Secondary | ICD-10-CM | POA: Diagnosis not present

## 2023-11-05 DIAGNOSIS — Z992 Dependence on renal dialysis: Secondary | ICD-10-CM | POA: Diagnosis not present

## 2023-11-05 DIAGNOSIS — N186 End stage renal disease: Secondary | ICD-10-CM | POA: Diagnosis not present

## 2023-11-05 DIAGNOSIS — D509 Iron deficiency anemia, unspecified: Secondary | ICD-10-CM | POA: Diagnosis not present

## 2023-11-07 DIAGNOSIS — N186 End stage renal disease: Secondary | ICD-10-CM | POA: Diagnosis not present

## 2023-11-07 DIAGNOSIS — Z992 Dependence on renal dialysis: Secondary | ICD-10-CM | POA: Diagnosis not present

## 2023-11-07 DIAGNOSIS — D631 Anemia in chronic kidney disease: Secondary | ICD-10-CM | POA: Diagnosis not present

## 2023-11-07 DIAGNOSIS — N2581 Secondary hyperparathyroidism of renal origin: Secondary | ICD-10-CM | POA: Diagnosis not present

## 2023-11-07 DIAGNOSIS — D509 Iron deficiency anemia, unspecified: Secondary | ICD-10-CM | POA: Diagnosis not present

## 2023-11-09 DIAGNOSIS — N2581 Secondary hyperparathyroidism of renal origin: Secondary | ICD-10-CM | POA: Diagnosis not present

## 2023-11-09 DIAGNOSIS — D509 Iron deficiency anemia, unspecified: Secondary | ICD-10-CM | POA: Diagnosis not present

## 2023-11-09 DIAGNOSIS — D631 Anemia in chronic kidney disease: Secondary | ICD-10-CM | POA: Diagnosis not present

## 2023-11-09 DIAGNOSIS — N186 End stage renal disease: Secondary | ICD-10-CM | POA: Diagnosis not present

## 2023-11-09 DIAGNOSIS — Z992 Dependence on renal dialysis: Secondary | ICD-10-CM | POA: Diagnosis not present

## 2023-11-12 ENCOUNTER — Encounter (HOSPITAL_COMMUNITY): Payer: Self-pay | Admitting: Vascular Surgery

## 2023-11-12 ENCOUNTER — Other Ambulatory Visit: Payer: Self-pay

## 2023-11-12 DIAGNOSIS — D631 Anemia in chronic kidney disease: Secondary | ICD-10-CM | POA: Diagnosis not present

## 2023-11-12 DIAGNOSIS — Z992 Dependence on renal dialysis: Secondary | ICD-10-CM | POA: Diagnosis not present

## 2023-11-12 DIAGNOSIS — N186 End stage renal disease: Secondary | ICD-10-CM | POA: Diagnosis not present

## 2023-11-12 DIAGNOSIS — N2581 Secondary hyperparathyroidism of renal origin: Secondary | ICD-10-CM | POA: Diagnosis not present

## 2023-11-12 DIAGNOSIS — D509 Iron deficiency anemia, unspecified: Secondary | ICD-10-CM | POA: Diagnosis not present

## 2023-11-12 NOTE — Anesthesia Preprocedure Evaluation (Addendum)
 Anesthesia Evaluation  Patient identified by MRN, date of birth, ID band Patient awake    Reviewed: Allergy & Precautions, NPO status , Patient's Chart, lab work & pertinent test results  Airway Mallampati: III  TM Distance: >3 FB Neck ROM: Full    Dental  (+) Dental Advisory Given, Missing   Pulmonary asthma , sleep apnea , COPD,  COPD inhaler, former smoker   Pulmonary exam normal breath sounds clear to auscultation       Cardiovascular hypertension, Pt. on medications + CAD, + Past MI, + Cardiac Stents and + CABG  Normal cardiovascular exam+ dysrhythmias (2nd degree AV block)  Rhythm:Regular Rate:Normal  Echo 02/19/23: 1. Akinesis of the anterior /anteroseptal/inferoseptal wall from base to  apex. Left ventricular ejection fraction, by estimation, is 40 to 45%. The  left ventricle has mildly decreased function. Left ventricular diastolic  parameters are indeterminate.   2. Right ventricular systolic function is moderately reduced. The right  ventricular size is normal.   3. Left atrial size was mildly dilated.   4. The mitral valve was not well visualized. No evidence of mitral valve  regurgitation.   5. The aortic valve was not well visualized. Aortic valve regurgitation  is not visualized. Mild aortic valve stenosis.   6. The inferior vena cava is normal in size with greater than 50%  respiratory variability, suggesting right atrial pressure of 3 mmHg.      Neuro/Psych  PSYCHIATRIC DISORDERS     Dementia  Neuromuscular disease    GI/Hepatic hiatal hernia, PUD,GERD  Medicated,,(+)     substance abuse    Endo/Other  diabetes, Type 2Hypothyroidism  Obesity   Renal/GU Dialysis and ESRFRenal disease (MWF)K+ 4.5     Musculoskeletal  (+) Arthritis ,    Abdominal   Peds  Hematology negative hematology ROS (+)   Anesthesia Other Findings   Reproductive/Obstetrics                              Anesthesia Physical Anesthesia Plan  ASA: 4  Anesthesia Plan: General   Post-op Pain Management: Tylenol  PO (pre-op )*   Induction: Intravenous  PONV Risk Score and Plan: 2 and Dexamethasone  and Ondansetron   Airway Management Planned: Oral ETT  Additional Equipment:   Intra-op Plan:   Post-operative Plan: Extubation in OR  Informed Consent: I have reviewed the patients History and Physical, chart, labs and discussed the procedure including the risks, benefits and alternatives for the proposed anesthesia with the patient or authorized representative who has indicated his/her understanding and acceptance.     Dental advisory given  Plan Discussed with: CRNA  Anesthesia Plan Comments: (PAT note written 11/12/2023 by Allison Zelenak, PA-C. Has unknown 1st/2nd degree AV block, followed by Dr. Lawana Pray.  Per 05/30/23 EP APP f/u:  "avoid AV nodal blocking agents -EKG with SR, variable PR with conducted and non-conducted PAC's, junctional beats -follow up EKG in 6 months to review for any progression of conduction system disease -given his co-morbidities, he would be a poor transvenous pacing candidate. Would need to consider leadless if pacing needed in future."   )        Anesthesia Quick Evaluation

## 2023-11-12 NOTE — Progress Notes (Signed)
 Anesthesia Chart Review: SAME DAY WORK-UP  Case: [id number] Date/Time: 11/13/23 0715   Procedures:      REVISON OF ARTERIOVENOUS FISTULA (Left)     TRANSPOSITION, VEIN, BASILIC (Left)     INSERTION, GRAFT, ARTERIOVENOUS, UPPER EXTREMITY (Left)     INSERTION OF DIALYSIS CATHETER   Anesthesia type: Choice   Diagnosis: End stage renal disease (HCC) [N18.6]   Pre-op  diagnosis: End stage renal disease   [facility] / [facility]   Surgeons: Adine Hoof, MD       DISCUSSION: Patient is a 73 year old male scheduled for the above procedure. Recent fistulogram for malfunctioning LUE AVF ("increased bleeding and decreased clearance ") showed pseudoaneurysms with 80% stenosis at cephalic arch.  Above procedures recommended.  History includes former smoker (quit 03/27/10), COPD (uses 2L O2 PRN), OSA, asthma, CAD (PTCA/DES RCA 1/923, 12/26/21, 01/20/22; s/p off-pump CABG: LIMA-LAD, SVG-PDA 06/05/22; possible PAF while in [facility]), dysrhythmia (1st/2nd degree AV block), HTN, HLD, DM2, hypothyroidism, ESRD, GERD, hiatal hernia, anemia, exertional dyspnea, prior substance abuse.   He was referred to EP after 12/2021 monitor showed Mobitz 1 with 2:1 and nocturnal CHB. He was asymptomatic, so PPM was not felt indicated at that time. Last EP visit 05/30/23 with [name], NP who wrote, "avoid AV nodal blocking agents -EKG with SR, variable PR with conducted and non-conducted PAC's, junctional beats -follow up EKG in 6 months to review for any progression of conduction system disease -given his co-morbidities, he would be a poor transvenous pacing candidate. Would need to consider leadless if pacing needed in future."   [place] admission 08/24/23 - 08/26/23 for influenza A and RSV bronchiolitis, COPD exacerbation, improved after treatment with Tamiflu , bronchodilators, mucolytic, prednisone . Post Ambulation O2 sats 96 to 97% on room air. EKG in ED showed 2nd degree AV block, type II  (known history).   Anesthesia team to evaluate on the day of surgery.   VS: Ht 5\' 4"  (1.626 m)   Wt 98 kg   BMI 37.08 kg/m  BP Readings from Last 3 Encounters:  11/01/23 113/67  10/22/23 (!) 155/76  09/25/23 120/64   Pulse Readings from Last 3 Encounters:  11/01/23 (!) 51  10/22/23 (!) 57  09/25/23 (!) 57     PROVIDERS: Wyvonna Heidelberg, MD is PCP  Dinah Franco, MD is primary cardiologist Moira Andrews, MD is EP cardiologist   LABS: For day of surgery. Last results in [facility] include: Lab Results  Component Value Date   WBC 3.4 (L) 09/20/2023   HGB 11.2 (L) 10/22/2023   HCT 33.0 (L) 10/22/2023   PLT 125 (L) 09/20/2023   GLUCOSE 126 (H) 10/22/2023   CHOL 90 05/03/2023   TRIG 55 05/03/2023   HDL 38 (L) 05/03/2023   LDLCALC 41 05/03/2023   ALT 16 09/20/2023   AST 15 09/20/2023   NA 137 10/22/2023   K 4.9 10/22/2023   CL 103 10/22/2023   CREATININE 13.20 (H) 10/22/2023   BUN 67 (H) 10/22/2023   CO2 25 09/20/2023   TSH 1.357 04/25/2023   INR 1.2 09/20/2023   HGBA1C 4.9 05/03/2023   MICROALBUR 7.4 02/09/2020     IMAGES: PCXR 08/24/23: IMPRESSION: Cardiomegaly with increased central interstitial markings bilaterally, which may represent pulmonary edema.   EKG:  EKG 08/24/23: Second degree AV block, Mobitz II Probable left atrial enlargement Nonspecific IVCD with LAD LVH with secondary repolarization abnormality Anterior Q waves, possibly due to LVH   CV: Echo 02/19/23:  IMPRESSIONS   1. Akinesis of the anterior /anteroseptal/inferoseptal wall from base to  apex. Left ventricular ejection fraction, by estimation, is 40 to 45%. The  left ventricle has mildly decreased function. Left ventricular diastolic  parameters are indeterminate.   2. Right ventricular systolic function is moderately reduced. The right  ventricular size is normal.   3. Left atrial size was mildly dilated.   4. The mitral valve was not well visualized. No evidence of mitral  valve  regurgitation.   5. The aortic valve was not well visualized. Aortic valve regurgitation  is not visualized. Mild aortic valve stenosis.   6. The inferior vena cava is normal in size with greater than 50%  respiratory variability, suggesting right atrial pressure of 3 mmHg.  - Comparison(s): No significant change from prior study.     Cardiac cath 05/30/22 (pre-CABG):   2nd Diag lesion is 30% stenosed.   RV Branch lesion is 90% stenosed.   RPDA lesion is 80% stenosed.   Mid RCA-1 lesion is 90% stenosed.   Mid RCA-2 lesion is 90% stenosed.   Dist RCA lesion is 75% stenosed.   Prox LAD lesion is 75% stenosed.   Mid Cx lesion is 50% stenosed.   Non-stenotic Prox RCA lesion was previously treated.   LV end diastolic pressure is mildly elevated.   There is no aortic valve stenosis.   Aortic saturation 97%, PA saturation 65%, PA pressure 35/16, mean PA pressure 24 mmHg, mean pulmonary capillary wedge pressure 17 mmHg, right atrial pressure 4/7 mmHg, cardiac output 6.94 L/min, cardiac index 3.43.   Severe in-stent restenosis in the mid RCA.  Denovo distal RCA lesion.  Back in July 2023, there was a moderate lesion but this has increased significantly. Proximal LAD lesion worsened as well compared to July 2023.  There is now an irregular 75% stenosis. Essentially normal right heart pressures.   Given multiple episodes of restenosis, repeat PCI will not likely given the long-term benefit.  Now with proximal LAD lesion which has changed in the last few months, will get cardiac surgery consult.  Will hold antiplatelet therapy in the event that he is a candidate for surgery.  May need to start IV cangrelor for antiplatelet therapy.    Carotid US  06/01/22: Summary:  - Right Carotid: Velocities in the right ICA are consistent with a 1-39%  stenosis.  - Left Carotid: Velocities in the left ICA are consistent with a 1-39%  stenosis.  - Vertebrals: Bilateral vertebral arteries demonstrate  antegrade flow.    Long Term Monitor 12/29/21 - 01/11/22: Patient had a min HR of 31 bpm, max HR of 125 bpm, and avg HR of 62 bpm. Predominant underlying rhythm was Sinus Rhythm. First Degree AV Block was present. Bundle Branch Block/IVCD was present. QRS morphology changes were present throughout recording. 4  episode(s) of AV Block (3rd) occurred, lasting a total of 49 secs. Second Degree AV Block-Mobitz I (Wenckebach) was present. Isolated SVEs were rare (<1.0%), and no SVE Couplets or SVE Triplets were present. Isolated VEs were occasional (3.1%, [id number]),  VE Couplets were rare (<1.0%, 1), and VE Triplets were rare (<1.0%, 1). MD notification criteria for Complete Heart Block met - report posted prior to notification per account request (MA). - Refer to EP for complete heart block.   Past Medical History:  Diagnosis Date   Anemia    Arthritis    Asthma    Bell palsy    left side of face- comes and goes  per pt on 11/12/23   CAD (coronary artery disease)    a. 2014 MV: abnl w/ infap ischemia; b. 03/2013 Cath: aneurysmal bleb in the LAD w/ otw nonobs dzs-->Med Rx.   Chronic back pain    Chronic kidney disease    on dialysis - mon wed fri   Chronic knee pain    a. 09/2015 s/p R TKA.   Chronic pain    Chronic shoulder pain    Chronic sinusitis    COPD (chronic obstructive pulmonary disease) (HCC)    Diabetes mellitus without complication (HCC)    type II , no meds, does not check blood sugar   Dyspnea    with exertion   ESRD on peritoneal dialysis (HCC)    on peritoneal dialysis, [facility] - mon wed fri   Essential hypertension    GERD (gastroesophageal reflux disease)    Gout    Hepatomegaly    noted on noncontrast CT 2015   History of hiatal hernia    Hyperlipidemia    Hypothyroidism    Lateral meniscus tear    Myocardial infarction (HCC) 07/2021   Neuromuscular disorder (HCC)    Obesity    Truncal   Obstructive sleep apnea    does not use cpap    On home oxygen   therapy    uses 2L when needed   Pneumonia    PUD (peptic ulcer disease)    remote, reports f/u EGD about 8 years ago unremarkable    Reactive airway disease    related to exposure to chemical during 9/11   Sinusitis    Substance abuse (HCC)    hx in past   Vitamin D  deficiency     Past Surgical History:  Procedure Laterality Date   A/V FISTULAGRAM N/A 02/28/2023   Procedure: A/V Fistulagram;  Surgeon: Kayla Part, MD;  Location: [facility] INVASIVE CV LAB;  Service: Cardiovascular;  Laterality: N/A;   A/V FISTULAGRAM N/A 10/22/2023   Procedure: A/V Fistulagram;  Surgeon: Adine Hoof, MD;  Location: [facility] INVASIVE CV LAB;  Service: Cardiovascular;  Laterality: N/A;   ASAD LT SHOULDER  12/15/2008   left shoulder   AV FISTULA PLACEMENT Left 08/09/2016   Procedure: BRACHIOCEPHALIC ARTERIOVENOUS (AV) FISTULA CREATION LEFT ARM;  Surgeon: Richrd Char, MD;  Location: [facility] OR;  Service: Vascular;  Laterality: Left;   CAPD INSERTION N/A 10/07/2018   Procedure: LAPAROSCOPIC PERITONEAL CATHETER PLACEMENT;  Surgeon: Derral Flick, MD;  Location: [facility] ORS;  Service: General;  Laterality: N/A;   CATARACT EXTRACTION W/PHACO Left 03/28/2016   Procedure: CATARACT EXTRACTION PHACO AND INTRAOCULAR LENS PLACEMENT LEFT EYE;  Surgeon: Albert Huff, MD;  Location: [facility] ORS;  Service: Ophthalmology;  Laterality: Left;  CDE: 4.77   CATARACT EXTRACTION W/PHACO Right 04/11/2016   Procedure: CATARACT EXTRACTION PHACO AND INTRAOCULAR LENS PLACEMENT RIGHT EYE; CDE:  4.74;  Surgeon: Albert Huff, MD;  Location: [facility] ORS;  Service: Ophthalmology;  Laterality: Right;   COLONOSCOPY  10/15/2008   Fields: Rectal polyp obliterated, not retrieved, hemorrhoids, single ascending colon diverticulum near the CV. Next colonoscopy April 2020   COLONOSCOPY N/A 12/25/2014   SLF: 1. Colorectal polyps (2) removed 2. Small internal hemorrhoids 3. the left colon is severely redundant. hyperplastic polyps   CORONARY ARTERY  BYPASS GRAFT N/A 06/05/2022   Procedure: OFF PUMP CORONARY ARTERY BYPASS GRAFTING (CABG) X 2 BYPASSES USING LEFT INTERNAL MAMMARY ARTERY AND RIGHT LEG GREATER SAPHENOUS VEIN HARVESTED ENDOSCOPICALLY;  Surgeon: Hilarie Lovely, MD;  Location:  [facility] OR;  Service: Open Heart Surgery;  Laterality: N/A;   CORONARY STENT INTERVENTION N/A 07/25/2021   Procedure: CORONARY STENT INTERVENTION;  Surgeon: Arnoldo Lapping, MD;  Location: [facility] INVASIVE CV LAB;  Service: Cardiovascular;  Laterality: N/A;   CORONARY STENT INTERVENTION N/A 12/26/2021   Procedure: CORONARY STENT INTERVENTION;  Surgeon: Sammy Crisp, MD;  Location: [facility] INVASIVE CV LAB;  Service: Cardiovascular;  Laterality: N/A;   CORONARY STENT INTERVENTION N/A 01/20/2022   Procedure: CORONARY STENT INTERVENTION;  Surgeon: Arnoldo Lapping, MD;  Location: [facility] INVASIVE CV LAB;  Service: Cardiovascular;  Laterality: N/A;   DOPPLER ECHOCARDIOGRAPHY     ESOPHAGOGASTRODUODENOSCOPY N/A 12/25/2014   SLF: 1. Anemia most likely due to CRI, gastritis, gastric polyps 2. Moderate non-erosive gastriits and mild duodenitis.  3.TWo large gstric polyps removed.    EYE SURGERY  12/22/2010   tear duct probing-[place]ardo   FOREIGN BODY REMOVAL  03/29/2011   Procedure: REMOVAL FOREIGN BODY EXTREMITY;  Surgeon: Elsa Halls, MD;  Location: [facility]S;  Service: Orthopedics;  Laterality: Right;  Removal Foreign Body Right Thumb   IR FLUORO GUIDE CV LINE RIGHT  08/06/2018   IR FLUORO GUIDE CV LINE RIGHT  02/17/2023   IR US  GUIDE VASC ACCESS RIGHT  08/06/2018   IR US  GUIDE VASC ACCESS RIGHT  02/17/2023   KNEE ARTHROSCOPY  10/16/2007   left   KNEE ARTHROSCOPY WITH LATERAL MENISECTOMY Right 10/14/2015   Procedure: LEFT KNEE ARTHROSCOPY WITH PARTIAL LATERAL MENISECTOMY;  Surgeon: Darrin Emerald, MD;  Location: [facility]S;  Service: Orthopedics;  Laterality: Right;   LEFT HEART CATH AND CORONARY ANGIOGRAPHY N/A 07/25/2021   Procedure: LEFT HEART CATH AND CORONARY  ANGIOGRAPHY;  Surgeon: Arnoldo Lapping, MD;  Location: [facility] INVASIVE CV LAB;  Service: Cardiovascular;  Laterality: N/A;   LEFT HEART CATH AND CORONARY ANGIOGRAPHY N/A 12/26/2021   Procedure: LEFT HEART CATH AND CORONARY ANGIOGRAPHY;  Surgeon: Sammy Crisp, MD;  [facility]: [facility] INVASIVE CV LAB;  Service: Cardiovascular;  Laterality: N/A;   LEFT HEART CATH AND CORONARY ANGIOGRAPHY N/A 01/20/2022   Procedure: LEFT HEART CATH AND CORONARY ANGIOGRAPHY;  Surgeon: Arnoldo Lapping, MD;  Location: [facility] INVASIVE CV LAB;  Service: Cardiovascular;  Laterality: N/A;   LEFT HEART CATHETERIZATION WITH CORONARY ANGIOGRAM N/A 03/28/2013   Procedure: LEFT HEART CATHETERIZATION WITH CORONARY ANGIOGRAM;  Surgeon: Arleen Lacer, MD;  Location: [facility] CATH LAB;  Service: Cardiovascular;  Laterality: N/A;   NM MYOVIEW  LTD     PENILE PROSTHESIS IMPLANT N/A 08/16/2015   Procedure: PENILE PROTHESIS INFLATABLE, three piece, Excisional biopsy of Penile ulcer, Penile molding;  Surgeon: Annamarie Kid, MD;  Location: [facility]S;  Service: Urology;  Laterality: N/A;   PENILE PROSTHESIS IMPLANT N/A 12/24/2017   Procedure: REMOVAL AND  REPLACEMENT  COLOPLAST PENILE PROSTHESIS;  Surgeon: Samson Croak, MD;  Location: [facility]S;  Service: Urology;  Laterality: N/A;   PERIPHERAL VASCULAR BALLOON ANGIOPLASTY  02/28/2023   Procedure: PERIPHERAL VASCULAR BALLOON ANGIOPLASTY;  Surgeon: Kayla Part, MD;  Location: [facility] INVASIVE CV LAB;  Service: Cardiovascular;;   PORT-A-CATH REMOVAL N/A 02/19/2023   Procedure: REMOVAL OF PERITONEAL DIALYSIS CATHETER;  Surgeon: Kayla Part, MD;  Location: [facility] OR;  Service: Vascular;  Laterality: N/A;   QUADRICEPS TENDON REPAIR  07/21/2011   Procedure: REPAIR QUADRICEP TENDON;  Surgeon: Elsa Halls, MD;  Location: [facility];  Service: Orthopedics;  Laterality: Right;   RIGHT/LEFT HEART CATH AND CORONARY ANGIOGRAPHY N/A 05/30/2022   Procedure: RIGHT/LEFT HEART CATH AND CORONARY ANGIOGRAPHY;  Surgeon: Lucendia Rusk, MD;  Location: [facility] INVASIVE CV LAB;  Service: Cardiovascular;  Laterality: N/A;   TEE WITHOUT CARDIOVERSION N/A 06/05/2022   Procedure: TRANSESOPHAGEAL ECHOCARDIOGRAM (TEE);  Surgeon: Hilarie Lovely, MD;  Location: [facility] OR;  Service: Open Heart Surgery;  Laterality: N/A;   TOENAIL EXCISION     removed x2-bilateral   UMBILICAL HERNIA REPAIR  07/17/2005   [place]    MEDICATIONS: No current facility-administered medications for this encounter.    acetaminophen  (TYLENOL ) 650 MG CR tablet   albuterol  (VENTOLIN  HFA) 108 (90 Base) MCG/ACT inhaler   amLODipine  (NORVASC ) 5 MG tablet   ascorbic acid  (VITAMIN C ) 500 MG tablet   aspirin  EC 81 MG tablet   atorvastatin  (LIPITOR ) 80 MG tablet   esomeprazole  (NEXIUM ) 40 MG capsule   ferric citrate (AURYXIA) 1 GM 210 MG(Fe) tablet   fluticasone  furoate-vilanterol (BREO ELLIPTA ) 200-25 MCG/ACT AEPB   furosemide  (LASIX ) 80 MG tablet   tamsulosin (FLOMAX) 0.4 MG CAPS capsule     Ella Gun, PA-C Surgical Short Stay/Anesthesiology [facility] Phone [phone] [facility] Phone [phone] 11/12/2023 3:23 PM

## 2023-11-12 NOTE — Progress Notes (Signed)
 PCP - Dr Clary Crown Cardiologist - Dr Maximo Spar  Chest x-ray - 08/24/23 EKG - 08/24/23 Stress Test - 03/06/13 ECHO - 02/19/24 Cardiac Cath - 05/30/22  ICD Pacemaker/Loop - n/a  Sleep Study -  Yes CPAP - does not use CPAP  Diabetes Type 2, no meds, does not check blood sugar  Blood Thinner Instructions:  n/a  NPO   Anesthesia review: Yes  STOP now taking any Aspirin  (unless otherwise instructed by your surgeon), Aleve, Naproxen, Ibuprofen , Motrin , Advil , Goody's, BC's, all herbal medications, fish oil, and all vitamins.   Coronavirus Screening Do you have any of the following symptoms:  Cough yes/no: No Fever (>100.92F)  yes/no: No Runny nose yes/no: No Sore throat yes/no: No Difficulty breathing/shortness of breath  yes/no: Yes  Have you traveled in the last 14 days and where? yes/no: No  Patient verbalized understanding of instructions that were given via phone

## 2023-11-13 ENCOUNTER — Encounter (HOSPITAL_COMMUNITY)
Admission: RE | Disposition: A | Discharge status: Home / Self Care | Payer: Self-pay | Source: Home / Self Care | Attending: Vascular Surgery

## 2023-11-13 ENCOUNTER — Ambulatory Visit (HOSPITAL_COMMUNITY)

## 2023-11-13 ENCOUNTER — Encounter (HOSPITAL_COMMUNITY): Payer: Self-pay | Admitting: Vascular Surgery

## 2023-11-13 ENCOUNTER — Other Ambulatory Visit (HOSPITAL_COMMUNITY): Payer: Self-pay

## 2023-11-13 ENCOUNTER — Other Ambulatory Visit: Payer: Self-pay

## 2023-11-13 ENCOUNTER — Ambulatory Visit (HOSPITAL_COMMUNITY)
Admission: RE | Admit: 2023-11-13 | Discharge: 2023-11-13 | Disposition: A | Discharge status: Home / Self Care | Attending: Vascular Surgery | Admitting: Vascular Surgery

## 2023-11-13 ENCOUNTER — Ambulatory Visit (HOSPITAL_COMMUNITY): Payer: Self-pay | Admitting: Anesthesiology

## 2023-11-13 ENCOUNTER — Ambulatory Visit (HOSPITAL_BASED_OUTPATIENT_CLINIC_OR_DEPARTMENT_OTHER): Payer: Self-pay | Admitting: Anesthesiology

## 2023-11-13 DIAGNOSIS — E1122 Type 2 diabetes mellitus with diabetic chronic kidney disease: Secondary | ICD-10-CM | POA: Diagnosis not present

## 2023-11-13 DIAGNOSIS — G4733 Obstructive sleep apnea (adult) (pediatric): Secondary | ICD-10-CM | POA: Insufficient documentation

## 2023-11-13 DIAGNOSIS — Z95828 Presence of other vascular implants and grafts: Secondary | ICD-10-CM | POA: Diagnosis not present

## 2023-11-13 DIAGNOSIS — Z992 Dependence on renal dialysis: Secondary | ICD-10-CM | POA: Insufficient documentation

## 2023-11-13 DIAGNOSIS — T82590A Other mechanical complication of surgically created arteriovenous fistula, initial encounter: Secondary | ICD-10-CM

## 2023-11-13 DIAGNOSIS — Z951 Presence of aortocoronary bypass graft: Secondary | ICD-10-CM | POA: Diagnosis not present

## 2023-11-13 DIAGNOSIS — N186 End stage renal disease: Secondary | ICD-10-CM

## 2023-11-13 DIAGNOSIS — N185 Chronic kidney disease, stage 5: Secondary | ICD-10-CM | POA: Diagnosis not present

## 2023-11-13 DIAGNOSIS — Y832 Surgical operation with anastomosis, bypass or graft as the cause of abnormal reaction of the patient, or of later complication, without mention of misadventure at the time of the procedure: Secondary | ICD-10-CM | POA: Insufficient documentation

## 2023-11-13 DIAGNOSIS — J449 Chronic obstructive pulmonary disease, unspecified: Secondary | ICD-10-CM | POA: Diagnosis not present

## 2023-11-13 DIAGNOSIS — I2510 Atherosclerotic heart disease of native coronary artery without angina pectoris: Secondary | ICD-10-CM | POA: Insufficient documentation

## 2023-11-13 DIAGNOSIS — I5020 Unspecified systolic (congestive) heart failure: Secondary | ICD-10-CM | POA: Diagnosis not present

## 2023-11-13 DIAGNOSIS — I252 Old myocardial infarction: Secondary | ICD-10-CM | POA: Diagnosis not present

## 2023-11-13 DIAGNOSIS — I517 Cardiomegaly: Secondary | ICD-10-CM | POA: Diagnosis not present

## 2023-11-13 DIAGNOSIS — I120 Hypertensive chronic kidney disease with stage 5 chronic kidney disease or end stage renal disease: Secondary | ICD-10-CM | POA: Insufficient documentation

## 2023-11-13 DIAGNOSIS — Z955 Presence of coronary angioplasty implant and graft: Secondary | ICD-10-CM | POA: Insufficient documentation

## 2023-11-13 DIAGNOSIS — T829XXA Unspecified complication of cardiac and vascular prosthetic device, implant and graft, initial encounter: Secondary | ICD-10-CM | POA: Diagnosis not present

## 2023-11-13 DIAGNOSIS — Z87891 Personal history of nicotine dependence: Secondary | ICD-10-CM | POA: Diagnosis not present

## 2023-11-13 DIAGNOSIS — T82898A Other specified complication of vascular prosthetic devices, implants and grafts, initial encounter: Secondary | ICD-10-CM

## 2023-11-13 DIAGNOSIS — I132 Hypertensive heart and chronic kidney disease with heart failure and with stage 5 chronic kidney disease, or end stage renal disease: Secondary | ICD-10-CM | POA: Diagnosis not present

## 2023-11-13 DIAGNOSIS — J9811 Atelectasis: Secondary | ICD-10-CM | POA: Diagnosis not present

## 2023-11-13 HISTORY — DX: Myoneural disorder, unspecified: G70.9

## 2023-11-13 HISTORY — DX: Hypothyroidism, unspecified: E03.9

## 2023-11-13 HISTORY — PX: BASCILIC VEIN TRANSPOSITION: SHX5742

## 2023-11-13 HISTORY — DX: Cardiac arrhythmia, unspecified: I49.9

## 2023-11-13 HISTORY — DX: Other psychoactive substance abuse, uncomplicated: F19.10

## 2023-11-13 HISTORY — PX: REVISON OF ARTERIOVENOUS FISTULA: SHX6074

## 2023-11-13 HISTORY — PX: INSERTION OF DIALYSIS CATHETER: SHX1324

## 2023-11-13 LAB — POCT I-STAT, CHEM 8
BUN: 43 mg/dL — ABNORMAL HIGH (ref 8–23)
Calcium, Ion: 1.16 mmol/L (ref 1.15–1.40)
Chloride: 101 mmol/L (ref 98–111)
Creatinine, Ser: 10.3 mg/dL — ABNORMAL HIGH (ref 0.61–1.24)
Glucose, Bld: 119 mg/dL — ABNORMAL HIGH (ref 70–99)
HCT: 30 % — ABNORMAL LOW (ref 39.0–52.0)
Hemoglobin: 10.2 g/dL — ABNORMAL LOW (ref 13.0–17.0)
Potassium: 4.5 mmol/L (ref 3.5–5.1)
Sodium: 140 mmol/L (ref 135–145)
TCO2: 26 mmol/L (ref 22–32)

## 2023-11-13 LAB — GLUCOSE, CAPILLARY
Glucose-Capillary: 116 mg/dL — ABNORMAL HIGH (ref 70–99)
Glucose-Capillary: 133 mg/dL — ABNORMAL HIGH (ref 70–99)

## 2023-11-13 SURGERY — REVISON OF ARTERIOVENOUS FISTULA
Anesthesia: General

## 2023-11-13 MED ORDER — SODIUM CHLORIDE 0.9% FLUSH: 3.0000 mL | Freq: Two times a day (BID) | INTRAVENOUS | Status: DC

## 2023-11-13 MED ORDER — PROPOFOL 10 MG/ML IV BOLUS
INTRAVENOUS | Status: DC | PRN
  Administered 2023-11-13: 70 mg via INTRAVENOUS

## 2023-11-13 MED ORDER — PROPOFOL 10 MG/ML IV BOLUS
INTRAVENOUS | Status: AC
  Filled 2023-11-13: qty 20

## 2023-11-13 MED ORDER — ONDANSETRON HCL 4 MG/2ML IJ SOLN
INTRAMUSCULAR | Status: DC | PRN
  Administered 2023-11-13: 4 mg via INTRAVENOUS

## 2023-11-13 MED ORDER — HEPARIN SODIUM (PORCINE) 1000 UNIT/ML IJ SOLN
INTRAMUSCULAR | Status: AC
  Filled 2023-11-13: qty 10

## 2023-11-13 MED ORDER — HYDROCODONE-ACETAMINOPHEN 5-325 MG PO TABS
1.0000 | ORAL_TABLET | Freq: Four times a day (QID) | ORAL | 0 refills | Status: DC | PRN
  Filled 2023-11-13: qty 15, 4d supply, fill #0

## 2023-11-13 MED ORDER — DEXAMETHASONE SODIUM PHOSPHATE 10 MG/ML IJ SOLN
INTRAMUSCULAR | Status: DC | PRN
  Administered 2023-11-13: 5 mg via INTRAVENOUS

## 2023-11-13 MED ORDER — CHLORHEXIDINE GLUCONATE 4 % EX SOLN: 60.0000 mL | Freq: Once | CUTANEOUS | Status: DC

## 2023-11-13 MED ORDER — INSULIN ASPART 100 UNIT/ML IJ SOLN
0.0000 [IU] | INTRAMUSCULAR | Status: DC | PRN
Start: 2023-11-13 — End: 2023-11-13

## 2023-11-13 MED ORDER — HEPARIN SODIUM (PORCINE) 1000 UNIT/ML IJ SOLN
INTRAMUSCULAR | Status: DC | PRN
Start: 2023-11-13 — End: 2023-11-13
  Administered 2023-11-13: 2400 [IU]

## 2023-11-13 MED ORDER — FENTANYL CITRATE (PF) 250 MCG/5ML IJ SOLN
INTRAMUSCULAR | Status: AC
  Filled 2023-11-13: qty 5

## 2023-11-13 MED ORDER — PHENYLEPHRINE 80 MCG/ML (10ML) SYRINGE FOR IV PUSH (FOR BLOOD PRESSURE SUPPORT)
PREFILLED_SYRINGE | INTRAVENOUS | Status: DC | PRN
  Administered 2023-11-13: 160 ug via INTRAVENOUS
  Administered 2023-11-13: 120 ug via INTRAVENOUS

## 2023-11-13 MED ORDER — HEPARIN 6000 UNIT IRRIGATION SOLUTION
Status: DC | PRN
  Administered 2023-11-13: 1

## 2023-11-13 MED ORDER — LIDOCAINE 2% (20 MG/ML) 5 ML SYRINGE
INTRAMUSCULAR | Status: DC | PRN
  Administered 2023-11-13: 80 mg via INTRAVENOUS

## 2023-11-13 MED ORDER — EPHEDRINE SULFATE-NACL 50-0.9 MG/10ML-% IV SOSY
PREFILLED_SYRINGE | INTRAVENOUS | Status: DC | PRN
  Administered 2023-11-13: 7.5 mg via INTRAVENOUS

## 2023-11-13 MED ORDER — ONDANSETRON HCL 4 MG/2ML IJ SOLN: 4.0000 mg | Freq: Once | INTRAMUSCULAR | Status: DC | PRN

## 2023-11-13 MED ORDER — ROCURONIUM BROMIDE 10 MG/ML (PF) SYRINGE
PREFILLED_SYRINGE | INTRAVENOUS | Status: DC | PRN
  Administered 2023-11-13: 60 mg via INTRAVENOUS
  Administered 2023-11-13: 10 mg via INTRAVENOUS

## 2023-11-13 MED ORDER — FENTANYL CITRATE (PF) 100 MCG/2ML IJ SOLN
INTRAMUSCULAR | Status: AC
Start: 2023-11-13 — End: 2023-11-13
  Filled 2023-11-13: qty 2

## 2023-11-13 MED ORDER — SUGAMMADEX SODIUM 200 MG/2ML IV SOLN
INTRAVENOUS | Status: DC | PRN
  Administered 2023-11-13 (×2): 200 mg via INTRAVENOUS

## 2023-11-13 MED ORDER — ORAL CARE MOUTH RINSE: 15.0000 mL | Freq: Once | OROMUCOSAL | Status: AC

## 2023-11-13 MED ORDER — ROCURONIUM BROMIDE 10 MG/ML (PF) SYRINGE
PREFILLED_SYRINGE | INTRAVENOUS | Status: AC
  Filled 2023-11-13: qty 10

## 2023-11-13 MED ORDER — DEXMEDETOMIDINE HCL IN NACL 80 MCG/20ML IV SOLN
INTRAVENOUS | Status: DC | PRN
  Administered 2023-11-13: 4 ug via INTRAVENOUS

## 2023-11-13 MED ORDER — FENTANYL CITRATE (PF) 100 MCG/2ML IJ SOLN
INTRAMUSCULAR | Status: AC
  Filled 2023-11-13: qty 2

## 2023-11-13 MED ORDER — HEPARIN 6000 UNIT IRRIGATION SOLUTION
Status: AC
  Filled 2023-11-13: qty 500

## 2023-11-13 MED ORDER — 0.9 % SODIUM CHLORIDE (POUR BTL) OPTIME
TOPICAL | Status: DC | PRN
  Administered 2023-11-13: 1000 mL

## 2023-11-13 MED ORDER — SODIUM CHLORIDE 0.9% FLUSH: 3.0000 mL | INTRAVENOUS | Status: DC | PRN

## 2023-11-13 MED ORDER — CHLORHEXIDINE GLUCONATE 0.12 % MT SOLN
15.0000 mL | Freq: Once | OROMUCOSAL | Status: AC
  Administered 2023-11-13: 15 mL via OROMUCOSAL
  Filled 2023-11-13: qty 15

## 2023-11-13 MED ORDER — CEFAZOLIN SODIUM-DEXTROSE 2-4 GM/100ML-% IV SOLN
2.0000 g | INTRAVENOUS | Status: AC
Start: 2023-11-13 — End: 2023-11-13
  Administered 2023-11-13: 2 g via INTRAVENOUS
  Filled 2023-11-13: qty 100

## 2023-11-13 MED ORDER — ONDANSETRON HCL 4 MG/2ML IJ SOLN
INTRAMUSCULAR | Status: AC
  Filled 2023-11-13: qty 2

## 2023-11-13 MED ORDER — SODIUM CHLORIDE 0.9 % IV SOLN: INTRAVENOUS | Status: DC

## 2023-11-13 MED ORDER — FENTANYL CITRATE (PF) 100 MCG/2ML IJ SOLN
25.0000 ug | INTRAMUSCULAR | Status: DC | PRN
  Administered 2023-11-13 (×2): 50 ug via INTRAVENOUS

## 2023-11-13 MED ORDER — GLYCOPYRROLATE PF 0.2 MG/ML IJ SOSY
PREFILLED_SYRINGE | INTRAMUSCULAR | Status: DC | PRN
  Administered 2023-11-13: .2 mg via INTRAVENOUS

## 2023-11-13 MED ORDER — PHENYLEPHRINE HCL-NACL 20-0.9 MG/250ML-% IV SOLN
INTRAVENOUS | Status: DC | PRN
Start: 2023-11-13 — End: 2023-11-13
  Administered 2023-11-13: 50 ug/min via INTRAVENOUS

## 2023-11-13 MED ORDER — FENTANYL CITRATE (PF) 250 MCG/5ML IJ SOLN
INTRAMUSCULAR | Status: DC | PRN
  Administered 2023-11-13: 25 ug via INTRAVENOUS
  Administered 2023-11-13: 50 ug via INTRAVENOUS
  Administered 2023-11-13: 25 ug via INTRAVENOUS

## 2023-11-13 MED ORDER — DEXAMETHASONE SODIUM PHOSPHATE 10 MG/ML IJ SOLN
INTRAMUSCULAR | Status: AC
  Filled 2023-11-13: qty 1

## 2023-11-13 SURGICAL SUPPLY — 41 items
ARMBAND PINK RESTRICT EXTREMIT (MISCELLANEOUS) ×3 IMPLANT
BAG COUNTER SPONGE SURGICOUNT (BAG) ×3 IMPLANT
BAG DECANTER FOR FLEXI CONT (MISCELLANEOUS) ×3 IMPLANT
BIOPATCH RED 1 DISK 7.0 (GAUZE/BANDAGES/DRESSINGS) ×3 IMPLANT
CANISTER SUCT 3000ML PPV (MISCELLANEOUS) ×3 IMPLANT
CATH PALINDROME-P 19CM W/VT (CATHETERS) IMPLANT
COVER PROBE W GEL 5X96 (DRAPES) ×3 IMPLANT
COVER SURGICAL LIGHT HANDLE (MISCELLANEOUS) ×3 IMPLANT
DERMABOND ADVANCED .7 DNX12 (GAUZE/BANDAGES/DRESSINGS) ×3 IMPLANT
DRAPE C-ARM 42X72 X-RAY (DRAPES) ×3 IMPLANT
DRAPE CHEST BREAST 15X10 FENES (DRAPES) ×3 IMPLANT
ELECTRODE REM PT RTRN 9FT ADLT (ELECTROSURGICAL) ×3 IMPLANT
GAUZE 4X4 16PLY ~~LOC~~+RFID DBL (SPONGE) ×3 IMPLANT
GLOVE BIO SURGEON STRL SZ7.5 (GLOVE) ×3 IMPLANT
GOWN STRL REUS W/ TWL LRG LVL3 (GOWN DISPOSABLE) ×6 IMPLANT
GOWN STRL REUS W/ TWL XL LVL3 (GOWN DISPOSABLE) ×3 IMPLANT
KIT BASIN OR (CUSTOM PROCEDURE TRAY) ×3 IMPLANT
KIT TURNOVER KIT B (KITS) ×3 IMPLANT
NDL 18GX1X1/2 (RX/OR ONLY) (NEEDLE) ×3 IMPLANT
NDL HYPO 25GX1X1/2 BEV (NEEDLE) ×3 IMPLANT
NEEDLE 18GX1X1/2 (RX/OR ONLY) (NEEDLE) ×2 IMPLANT
NEEDLE HYPO 25GX1X1/2 BEV (NEEDLE) ×2 IMPLANT
NS IRRIG 1000ML POUR BTL (IV SOLUTION) ×3 IMPLANT
PACK CV ACCESS (CUSTOM PROCEDURE TRAY) ×3 IMPLANT
PACK SRG BSC III STRL LF ECLPS (CUSTOM PROCEDURE TRAY) ×3 IMPLANT
PAD ARMBOARD POSITIONER FOAM (MISCELLANEOUS) ×6 IMPLANT
POWDER SURGICEL 3.0 GRAM (HEMOSTASIS) IMPLANT
SET MICROPUNCTURE 5F STIFF (MISCELLANEOUS) IMPLANT
SUT ETHILON 3 0 PS 1 (SUTURE) ×3 IMPLANT
SUT MNCRL AB 4-0 PS2 18 (SUTURE) ×3 IMPLANT
SUT PROLENE 6 0 BV (SUTURE) ×3 IMPLANT
SUT SILK 2 0 SH (SUTURE) IMPLANT
SUT VIC AB 3-0 SH 27X BRD (SUTURE) ×3 IMPLANT
SYR 10ML LL (SYRINGE) ×3 IMPLANT
SYR 20ML LL LF (SYRINGE) ×6 IMPLANT
SYR 5ML LL (SYRINGE) ×3 IMPLANT
SYR CONTROL 10ML LL (SYRINGE) ×3 IMPLANT
TOWEL GREEN STERILE (TOWEL DISPOSABLE) ×3 IMPLANT
TOWEL GREEN STERILE FF (TOWEL DISPOSABLE) ×6 IMPLANT
UNDERPAD 30X36 HEAVY ABSORB (UNDERPADS AND DIAPERS) ×3 IMPLANT
WATER STERILE IRR 1000ML POUR (IV SOLUTION) ×3 IMPLANT

## 2023-11-13 NOTE — Anesthesia Procedure Notes (Signed)
 Procedure Name: Intubation Date/Time: 11/13/2023 7:41 AM  Performed by: Raylene Calamity, CRNAPre-anesthesia Checklist: Patient identified, Emergency Drugs available, Suction available and Patient being monitored Patient Re-evaluated:Patient Re-evaluated prior to induction Oxygen  Delivery Method: Circle System Utilized Preoxygenation: Pre-oxygenation with 100% oxygen  Induction Type: IV induction Ventilation: Mask ventilation without difficulty Laryngoscope Size: Glidescope and 4 Grade View: Grade I Tube type: Oral Number of attempts: 1 Airway Equipment and Method: Stylet and Oral airway Placement Confirmation: ETT inserted through vocal cords under direct vision, positive ETCO2 and breath sounds checked- equal and bilateral Secured at: 22 cm Tube secured with: Tape Dental Injury: Teeth and Oropharynx as per pre-operative assessment

## 2023-11-13 NOTE — Discharge Instructions (Signed)
   [facility]  Discharge Instructions  AV Fistula or Graft Surgery for Dialysis Access  Please refer to the following instructions for your post-procedure care. Your surgeon or physician assistant will discuss any changes with you.  Activity  You may drive the day following your surgery, if you are comfortable and no longer taking prescription pain medication. Resume full activity as the soreness in your incision resolves.  Bathing/Showering  You may shower after you go home. Keep your incision dry for 48 hours. Do not soak in a bathtub, hot tub, or swim until the incision heals completely. You may not shower if you have a hemodialysis catheter.  Incision Care  Clean your incision with mild soap and water after 48 hours. Pat the area dry with a clean towel. You do not need a bandage unless otherwise instructed. Do not apply any ointments or creams to your incision. You may have skin glue on your incision. Do not peel it off. It will come off on its own in about one week. Your arm may swell a bit after surgery. To reduce swelling use pillows to elevate your arm so it is above your heart. Your doctor will tell you if you need to lightly wrap your arm with an ACE bandage.  Diet  Resume your normal diet. There are not special food restrictions following this procedure. In order to heal from your surgery, it is CRITICAL to get adequate nutrition. Your body requires vitamins, minerals, and protein. Vegetables are the best source of vitamins and minerals. Vegetables also provide the perfect balance of protein. Processed food has little nutritional value, so try to avoid this.  Medications  Resume taking all of your medications. If your incision is causing pain, you may take over-the counter pain relievers such as acetaminophen (Tylenol). If you were prescribed a stronger pain medication, please be aware these medications can cause nausea and constipation. Prevent  nausea by taking the medication with a snack or meal. Avoid constipation by drinking plenty of fluids and eating foods with high amount of fiber, such as fruits, vegetables, and grains. Do not take Tylenol if you are taking prescription pain medications.     Follow up Your surgeon may want to see you in the office following your access surgery. If so, this will be arranged at the time of your surgery.  Please call us immediately for any of the following conditions:  Increased pain, redness, drainage (pus) from your incision site Fever of 101 degrees or higher Severe or worsening pain at your incision site Hand pain or numbness.  Reduce your risk of vascular disease:  Stop smoking. If you would like help, call [facility] at 1-800-QUIT-NOW (641-559-7479) or [place] at [phone]  Manage your cholesterol Maintain a desired weight Control your diabetes Keep your blood pressure down  Dialysis  It will take several weeks to several months for your new dialysis access to be ready for use. Your surgeon will determine when it is OK to use it. Your nephrologist will continue to direct your dialysis. You can continue to use your Permcath until your new access is ready for use.  If you have any questions, please call the office at [phone].

## 2023-11-13 NOTE — Anesthesia Postprocedure Evaluation (Signed)
 Anesthesia Post Note  Patient: [name] [name]  Procedure(s) Performed: REVISON OF ARTERIOVENOUS FISTULA (Left) TRANSPOSITION, VEIN, BASILIC (Left) INSERTION OF DIALYSIS CATHETER     Patient location during evaluation: PACU Anesthesia Type: General Level of consciousness: awake and alert Pain management: pain level controlled Vital Signs Assessment: post-procedure vital signs reviewed and stable Respiratory status: spontaneous breathing, nonlabored ventilation and respiratory function stable Cardiovascular status: blood pressure returned to baseline and stable Postop Assessment: no apparent nausea or vomiting Anesthetic complications: no   No notable events documented.  Last Vitals:  Vitals:   11/13/23 1115 11/13/23 1130  BP: 119/69 119/73  Pulse: 61 65  Resp: (!) 21 (!) 21  Temp:    SpO2: 92% 93%    Last Pain:  Vitals:   11/13/23 1130  TempSrc:   PainSc: 3                  Erin Havers

## 2023-11-13 NOTE — H&P (Signed)
 HPI:   [name] [name] is a 73 y.o. male recently peritoneal dialysis catheter removal and subsequent venoplasty of 3 separate lesions within the fistula and he is now here for follow-up.  He states that his fistula is having more difficulty with time. He is here for revision with tdc placement.          Past Medical History:  Diagnosis Date   Anemia     Arthritis     Asthma     Bell palsy     CAD (coronary artery disease)      a. 2014 MV: abnl w/ infap ischemia; b. 03/2013 Cath: aneurysmal bleb in the LAD w/ otw nonobs dzs-->Med Rx.   Chronic back pain     Chronic knee pain      a. 09/2015 s/p R TKA.   Chronic pain     Chronic shoulder pain     Chronic sinusitis     COPD (chronic obstructive pulmonary disease) (HCC)     Diabetes mellitus without complication (HCC)      type II    ESRD on peritoneal dialysis (HCC)      on peritoneal dialysis, [facility]   Essential hypertension     GERD (gastroesophageal reflux disease)     Gout     Hepatomegaly      noted on noncontrast CT 2015   History of hiatal hernia     Hyperlipidemia     Lateral meniscus tear     Obesity      Truncal   Obstructive sleep apnea      does not use cpap    On home oxygen  therapy      uses 2l when is going somewhere per patient    PUD (peptic ulcer disease)      remote, reports f/u EGD about 8 years ago unremarkable    Reactive airway disease      related to exposure to chemical during 9/11   Sinusitis     Vitamin D  deficiency                    Family History  Problem Relation Age of Onset   Hypertension Mother          MI   Cancer Mother          breast    Diabetes Mother     Diabetes Father     Hypertension Father     Hypertension Sister     Diabetes Sister     Arthritis Other     Asthma Other     Lung disease Other     Anesthesia problems Neg Hx     Hypotension Neg Hx     Malignant hyperthermia Neg Hx     Pseudochol deficiency Neg Hx     Colon cancer Neg Hx                     Past Surgical History:  Procedure Laterality Date   A/V FISTULAGRAM N/A 02/28/2023    Procedure: A/V Fistulagram;  Surgeon: Kayla Part, MD;  Location: [facility] INVASIVE CV LAB;  Service: Cardiovascular;  Laterality: N/A;   ASAD LT SHOULDER   12/15/2008    left shoulder   AV FISTULA PLACEMENT Left 08/09/2016    Procedure: BRACHIOCEPHALIC ARTERIOVENOUS (AV) FISTULA CREATION LEFT ARM;  Surgeon: Richrd Char, MD;  Location: [facility] OR;  Service: Vascular;  Laterality: Left;   CAPD INSERTION N/A  10/07/2018    Procedure: LAPAROSCOPIC PERITONEAL CATHETER PLACEMENT;  Surgeon: Dorrie Gaudier Alphonso Aschoff, MD;  Location: [facility] ORS;  Service: General;  Laterality: N/A;   CATARACT EXTRACTION W/PHACO Left 03/28/2016    Procedure: CATARACT EXTRACTION PHACO AND INTRAOCULAR LENS PLACEMENT LEFT EYE;  Surgeon: Albert Huff, MD;  Location: [facility] ORS;  Service: Ophthalmology;  Laterality: Left;  CDE: 4.77   CATARACT EXTRACTION W/PHACO Right 04/11/2016    Procedure: CATARACT EXTRACTION PHACO AND INTRAOCULAR LENS PLACEMENT RIGHT EYE; CDE:  4.74;  Surgeon: Albert Huff, MD;  Location: [facility] ORS;  Service: Ophthalmology;  Laterality: Right;   COLONOSCOPY   10/15/2008    Fields: Rectal polyp obliterated, not retrieved, hemorrhoids, single ascending colon diverticulum near the CV. Next colonoscopy April 2020   COLONOSCOPY N/A 12/25/2014    SLF: 1. Colorectal polyps (2) removed 2. Small internal hemorrhoids 3. the left colon is severely redundant. hyperplastic polyps   CORONARY ARTERY BYPASS GRAFT N/A 06/05/2022    Procedure: OFF PUMP CORONARY ARTERY BYPASS GRAFTING (CABG) X 2 BYPASSES USING LEFT INTERNAL MAMMARY ARTERY AND RIGHT LEG GREATER SAPHENOUS VEIN HARVESTED ENDOSCOPICALLY;  Surgeon: Hilarie Lovely, MD;  Location: [facility] OR;  Service: Open Heart Surgery;  Laterality: N/A;   CORONARY STENT INTERVENTION N/A 07/25/2021    Procedure: CORONARY STENT INTERVENTION;  Surgeon: Arnoldo Lapping, MD;  Location: [facility] INVASIVE CV LAB;   Service: Cardiovascular;  Laterality: N/A;   CORONARY STENT INTERVENTION N/A 12/26/2021    Procedure: CORONARY STENT INTERVENTION;  Surgeon: Sammy Crisp, MD;  Location: [facility] INVASIVE CV LAB;  Service: Cardiovascular;  Laterality: N/A;   CORONARY STENT INTERVENTION N/A 01/20/2022    Procedure: CORONARY STENT INTERVENTION;  Surgeon: Arnoldo Lapping, MD;  Location: [facility] INVASIVE CV LAB;  Service: Cardiovascular;  Laterality: N/A;   DOPPLER ECHOCARDIOGRAPHY       ESOPHAGOGASTRODUODENOSCOPY N/A 12/25/2014    SLF: 1. Anemia most likely due to CRI, gastritis, gastric polyps 2. Moderate non-erosive gastriits and mild duodenitis.  3.TWo large gstric polyps removed.    EYE SURGERY   12/22/2010    tear duct probing-[place]   FOREIGN BODY REMOVAL   03/29/2011    Procedure: REMOVAL FOREIGN BODY EXTREMITY;  Surgeon: Elsa Halls, MD;  Location: [facility];  Service: Orthopedics;  Laterality: Right;  Removal Foreign Body Right Thumb   IR FLUORO GUIDE CV LINE RIGHT   08/06/2018   IR FLUORO GUIDE CV LINE RIGHT   02/17/2023   IR US  GUIDE VASC ACCESS RIGHT   08/06/2018   IR US  GUIDE VASC ACCESS RIGHT   02/17/2023   KNEE ARTHROSCOPY   10/16/2007    left   KNEE ARTHROSCOPY WITH LATERAL MENISECTOMY Right 10/14/2015    Procedure: LEFT KNEE ARTHROSCOPY WITH PARTIAL LATERAL MENISECTOMY;  Surgeon: Darrin Emerald, MD;  Location: [facility]S;  Service: Orthopedics;  Laterality: Right;   LEFT HEART CATH AND CORONARY ANGIOGRAPHY N/A 07/25/2021    Procedure: LEFT HEART CATH AND CORONARY ANGIOGRAPHY;  Surgeon: Arnoldo Lapping, MD;  Location: [facility] INVASIVE CV LAB;  Service: Cardiovascular;  Laterality: N/A;   LEFT HEART CATH AND CORONARY ANGIOGRAPHY N/A 12/26/2021    Procedure: LEFT HEART CATH AND CORONARY ANGIOGRAPHY;  Surgeon: Sammy Crisp, MD;  Location: [facility] INVASIVE CV LAB;  Service: Cardiovascular;  Laterality: N/A;   LEFT HEART CATH AND CORONARY ANGIOGRAPHY N/A 01/20/2022    Procedure: LEFT HEART CATH AND CORONARY ANGIOGRAPHY;   Surgeon: Arnoldo Lapping, MD;  Location: [facility] INVASIVE CV LAB;  Service: Cardiovascular;  Laterality: N/A;   LEFT HEART  CATHETERIZATION WITH CORONARY ANGIOGRAM N/A 03/28/2013    Procedure: LEFT HEART CATHETERIZATION WITH CORONARY ANGIOGRAM;  Surgeon: Arleen Lacer, MD;  Location: [facility] CATH LAB;  Service: Cardiovascular;  Laterality: N/A;   NM MYOVIEW  LTD       PENILE PROSTHESIS IMPLANT N/A 08/16/2015    Procedure: PENILE PROTHESIS INFLATABLE, three piece, Excisional biopsy of Penile ulcer, Penile molding;  Surgeon: Annamarie Kid, MD;  Location: [facility] ORS;  Service: Urology;  Laterality: N/A;   PENILE PROSTHESIS IMPLANT N/A 12/24/2017    Procedure: REMOVAL AND  REPLACEMENT  COLOPLAST PENILE PROSTHESIS;  Surgeon: Samson Croak, MD;  Location: [facility] ORS;  Service: Urology;  Laterality: N/A;   PERIPHERAL VASCULAR BALLOON ANGIOPLASTY   02/28/2023    Procedure: PERIPHERAL VASCULAR BALLOON ANGIOPLASTY;  Surgeon: Kayla Part, MD;  Location: [facility] INVASIVE CV LAB;  Service: Cardiovascular;;   PORT-A-CATH REMOVAL N/A 02/19/2023    Procedure: REMOVAL OF PERITONEAL DIALYSIS CATHETER;  Surgeon: Kayla Part, MD;  Location: [facility] OR;  Service: Vascular;  Laterality: N/A;   QUADRICEPS TENDON REPAIR   07/21/2011    Procedure: REPAIR QUADRICEP TENDON;  Surgeon: Elsa Halls, MD;  Location: [facility];  Service: Orthopedics;  Laterality: Right;   RIGHT/LEFT HEART CATH AND CORONARY ANGIOGRAPHY N/A 05/30/2022    Procedure: RIGHT/LEFT HEART CATH AND CORONARY ANGIOGRAPHY;  Surgeon: Lucendia Rusk, MD;  Location: [facility] INVASIVE CV LAB;  Service: Cardiovascular;  Laterality: N/A;   TEE WITHOUT CARDIOVERSION N/A 06/05/2022    Procedure: TRANSESOPHAGEAL ECHOCARDIOGRAM (TEE);  Surgeon: Hilarie Lovely, MD;  Location: [facility] OR;  Service: Open Heart Surgery;  Laterality: N/A;   TOENAIL EXCISION        removed x2-bilateral   UMBILICAL HERNIA REPAIR   07/17/2005    [place]           Short Social History:  Social  History             Tobacco Use   Smoking status: Former      Current packs/day: 0.00      Average packs/day: 1 pack/day for 25.0 years (25.0 ttl pk-yrs)      Types: Cigarettes      Start date: 03/27/1985      Quit date: 03/27/2010      Years since quitting: 13.2   Smokeless tobacco: Never   Tobacco comments:      Quit x 7 years  Substance Use Topics   Alcohol  use: Not Currently      Comment: occasionally      Allergies         Allergies  Allergen Reactions   Opana [Oxymorphone Hcl] Itching   Tramadol  Itching                   Current Outpatient Medications  Medication Sig Dispense Refill   albuterol  (VENTOLIN  HFA) 108 (90 Base) MCG/ACT inhaler Inhale 2 puffs into the lungs every 6 (six) hours as needed for wheezing or shortness of breath. 18 g 11   amLODipine  (NORVASC ) 5 MG tablet Take 1 tablet (5 mg total) by mouth daily. 30 tablet 0   ARIPiprazole  (ABILIFY ) 10 MG tablet Take 10 mg by mouth at bedtime.       ascorbic acid  (VITAMIN C ) 500 MG tablet Take 1 tablet (500 mg total) by mouth daily. 100 tablet 0   aspirin  EC 81 MG tablet Take 81 mg by mouth daily. Swallow whole.       atorvastatin  (LIPITOR ) 80 MG tablet Take  1 tablet (80 mg total) by mouth daily. 30 tablet 1   Darbepoetin Alfa  (ARANESP ) 150 MCG/0.3ML SOSY injection Inject 0.3 mLs (150 mcg total) into the skin every Sunday at 6pm.       Darbepoetin Alfa -Albumin  (ARANESP  IJ) Inject as directed. Done at HD       esomeprazole  (NEXIUM ) 40 MG capsule Take 40 mg by mouth 2 (two) times daily before a meal.       fluticasone  furoate-vilanterol (BREO ELLIPTA ) 200-25 MCG/ACT AEPB Use 1 (one) inhaltion once a day. 60 each 0   hydrocortisone  (ANUSOL -HC) 2.5 % rectal cream Place 1 Application rectally 2 (two) times daily. (Patient taking differently: Place 1 Application rectally as needed.) 30 g 1   Nystatin (GERHARDT'S BUTT CREAM) CREA Apply 1 Application topically 4 (four) times daily.          No current  facility-administered medications for this visit.        Review of Systems  Constitutional:  Constitutional negative. HENT: HENT negative.  Eyes: Eyes negative.  Respiratory: Respiratory negative.  Cardiovascular: Cardiovascular negative.  GI: Gastrointestinal negative.  Musculoskeletal: Musculoskeletal negative.  Skin: Skin negative.  Neurological: Neurological negative. Hematologic:       Occasional increased bleeding after dialysis Psychiatric: Psychiatric negative.          Objective:   Vitals:   11/13/23 0548  BP: 128/65  Pulse: (!) 57  Resp: 20  Temp: 97.8 F (36.6 C)  SpO2: 95%    Physical Exam HENT:     Head: Normocephalic.     Nose: Nose normal.  Eyes:     Pupils: Pupils are equal, round, and reactive to light.  Pulmonary:     Effort: Pulmonary effort is normal.  Abdominal:     General: Abdomen is flat.  Musculoskeletal:        General: Normal range of motion.     Cervical back: Normal range of motion.     Right lower leg: No edema.     Left lower leg: No edema.     Comments: Left arm AV fistula has pulsatility peripherally with more of a thrill palpable up to the shoulder and centrally  Skin:    General: Skin is warm.     Capillary Refill: Capillary refill takes less than 2 seconds.  Neurological:     General: No focal deficit present.     Mental Status: He is alert.  Psychiatric:        Mood and Affect: Mood normal.        Thought Content: Thought content normal.        Data: Recent fistulogram reviewed    Assessment/Plan:    73 year old male with end-stage renal disease with continued malfunction of left upper arm AV fistula.  Plans for cephalic vein turndown vs avg and will also require tdc. Risks, benefits, alternatives again reviewed and consent signed.   Babbette Dalesandro C. Vikki Graves, MD [facility] Office: [phone] Pager: [phone]

## 2023-11-13 NOTE — Op Note (Signed)
 Patient name: [name] [name] MRN: [id number] DOB: Mar 28, 1951 Sex: male  11/13/2023 Pre-operative Diagnosis: End-stage renal disease, malfunctioning left arm AV fistula Post-operative diagnosis:  Same Surgeon:  Sarajane Cumming. Vikki Graves, MD Assistant: Naida Austria, PA  Procedure Performed: 1.  Placement of right IJ 19 cm tunneled dialysis catheter with ultrasound and fluoroscopic guidance 2.  Revision of left arm cephalic vein AV fistula with turndown to axillary vein  Indications: 73 year old male with a history of end-stage renal disease currently dialyzing via left upper arm AV fistula.  Patient's fistula has begun to malfunction with  decreased clearance and increased bleeding time after dialysis.  He has undergone fistulogram with notable findings of serpentine appearing fistula with multiple areas of stenoses towards the cephalic arch and abdominal indicated for the above-noted procedure.  Experience assistant was necessary to facilitate exposure of the cephalic vein in the upper arm and turned his down performing anastomosis end to end fashion to the axillary vein.  Findings: The cephalic vein in the deltopectoral groove and higher in the upper left arm did appear healthy but was quite tortuous and was mobilized through 2 separate skip incisions.  There were 2 axillary veins and one of the paired axillary veins was used to create an anastomosis to the cephalic vein fistula after turndown and at completion there was a very strong thrill in the fistula with a palpable radial artery pulse at the wrist.   Procedure:  The patient was identified in the holding area and taken to the operating room where he was placed upon the table and general anesthesia was induced.  He was sterilely prepped and draped in the bilateral neck and chest as well as left upper extremity in usual fashion, antibiotics were administered and a timeout was called.  We began using ultrasound to identify a very large patent  compressible right internal jugular vein and this was cannulated with a micropuncture needle followed by wire and the sheath.  I then tunneled a 19 cm tunneled dialysis catheter from the counterincision and extended incision in the neck.  We then placed a J-wire through the micropuncture sheath this was placed centrally under fluoroscopic guidance and then the wire tract was serially dilated the introducer sheath was placed and the catheter was placed centrally.  It was confirmed in position with fluoroscopy.  It was flushed with heparinized saline and ultimately locked with concentrated heparin  and both ports and affixed to the skin at the entrance site with 3-0 nylon suture and the neck incision was closed with 4 Monocryl and Dermabond and a sterile dressing were applied to both sites.    Attention was then turned to the left upper arm where ultrasound was used to traced the fistula and this was marked on the skin.  2 separate skip incisions were created in the arm in the deltopectoral groove and the fistula was mobilized for the majority of the upper arm and marked for orientation.  Between these 2 incisions down to the axilla and created a third incision and dissected down to the paired axillary veins identified 1 axillary vein that would be suitable for anastomosis and this was marked for orientation as well.  I then tied off the fistula in the deltopectoral groove with 2-0 silk suture tied off multiple branches as well and then transected this.  It was flushed with heparinized saline clamped and tunneled towards the axillary vein.  The axillary vein was then tied off distally and transected flushed with heparinized saline and  clamped.  The end of the fistula and into the axillary vein was then spatulated and sewn end to end with 6-0 Prolene suture.  Prior to completion we allowed flushing all directions and thoroughly with heparinized saline.  Upon completion there was a very strong thrill in the fistula  running into the axillary vein that could be palpated and traced with Doppler.  Soft tissue was freed up around the fistula to allow to seat without any tension.  Hemostasis was obtained in all 3 wounds and the wounds were irrigated and closed in layers of Vicryl and Monocryl.  Dermabond is placed at the skin level.  The patient was then awakened from anesthesia having tolerated procedure well without any complication.  All counts were correct at completion.  Chest x-ray planned for the PACU.  EBL: 100cc     Suvan Stcyr C. Vikki Graves, MD [facility] Office: [phone] Pager: [phone]

## 2023-11-13 NOTE — Transfer of Care (Signed)
 Immediate Anesthesia Transfer of Care Note  Patient: [name] [name]  Procedure(s) Performed: REVISON OF ARTERIOVENOUS FISTULA (Left) TRANSPOSITION, VEIN, BASILIC (Left) INSERTION OF DIALYSIS CATHETER  Patient Location: PACU  Anesthesia Type:General  Level of Consciousness: awake and alert   Airway & Oxygen  Therapy: Patient Spontanous Breathing  Post-op Assessment: Report given to RN  Post vital signs: Reviewed and stable  Last Vitals:  Vitals Value Taken Time  BP    Temp    Pulse    Resp    SpO2      Last Pain:  Vitals:   11/13/23 0645  TempSrc:   PainSc: 0-No pain         Complications: No notable events documented.

## 2023-11-14 ENCOUNTER — Encounter (HOSPITAL_COMMUNITY): Payer: Self-pay | Admitting: Vascular Surgery

## 2023-11-14 DIAGNOSIS — D631 Anemia in chronic kidney disease: Secondary | ICD-10-CM | POA: Diagnosis not present

## 2023-11-14 DIAGNOSIS — N186 End stage renal disease: Secondary | ICD-10-CM | POA: Diagnosis not present

## 2023-11-14 DIAGNOSIS — N2581 Secondary hyperparathyroidism of renal origin: Secondary | ICD-10-CM | POA: Diagnosis not present

## 2023-11-14 DIAGNOSIS — D509 Iron deficiency anemia, unspecified: Secondary | ICD-10-CM | POA: Diagnosis not present

## 2023-11-14 DIAGNOSIS — Z992 Dependence on renal dialysis: Secondary | ICD-10-CM | POA: Diagnosis not present

## 2023-11-15 DIAGNOSIS — D631 Anemia in chronic kidney disease: Secondary | ICD-10-CM | POA: Diagnosis not present

## 2023-11-15 DIAGNOSIS — N186 End stage renal disease: Secondary | ICD-10-CM | POA: Diagnosis not present

## 2023-11-15 DIAGNOSIS — D509 Iron deficiency anemia, unspecified: Secondary | ICD-10-CM | POA: Diagnosis not present

## 2023-11-15 DIAGNOSIS — Z992 Dependence on renal dialysis: Secondary | ICD-10-CM | POA: Diagnosis not present

## 2023-11-15 DIAGNOSIS — N2581 Secondary hyperparathyroidism of renal origin: Secondary | ICD-10-CM | POA: Diagnosis not present

## 2023-11-16 DIAGNOSIS — N2581 Secondary hyperparathyroidism of renal origin: Secondary | ICD-10-CM | POA: Diagnosis not present

## 2023-11-16 DIAGNOSIS — D631 Anemia in chronic kidney disease: Secondary | ICD-10-CM | POA: Diagnosis not present

## 2023-11-16 DIAGNOSIS — D509 Iron deficiency anemia, unspecified: Secondary | ICD-10-CM | POA: Diagnosis not present

## 2023-11-16 DIAGNOSIS — Z992 Dependence on renal dialysis: Secondary | ICD-10-CM | POA: Diagnosis not present

## 2023-11-16 DIAGNOSIS — N186 End stage renal disease: Secondary | ICD-10-CM | POA: Diagnosis not present

## 2023-11-19 ENCOUNTER — Other Ambulatory Visit: Payer: Self-pay

## 2023-11-19 ENCOUNTER — Emergency Department (HOSPITAL_COMMUNITY)

## 2023-11-19 ENCOUNTER — Encounter (HOSPITAL_COMMUNITY): Payer: Self-pay

## 2023-11-19 ENCOUNTER — Emergency Department (HOSPITAL_COMMUNITY)
Admission: EM | Admit: 2023-11-19 | Discharge: 2023-11-19 | Disposition: A | Discharge status: Home / Self Care | Attending: Emergency Medicine | Admitting: Emergency Medicine

## 2023-11-19 DIAGNOSIS — E871 Hypo-osmolality and hyponatremia: Secondary | ICD-10-CM | POA: Insufficient documentation

## 2023-11-19 DIAGNOSIS — Z79899 Other long term (current) drug therapy: Secondary | ICD-10-CM | POA: Insufficient documentation

## 2023-11-19 DIAGNOSIS — R001 Bradycardia, unspecified: Secondary | ICD-10-CM | POA: Diagnosis not present

## 2023-11-19 DIAGNOSIS — D509 Iron deficiency anemia, unspecified: Secondary | ICD-10-CM | POA: Diagnosis not present

## 2023-11-19 DIAGNOSIS — E1122 Type 2 diabetes mellitus with diabetic chronic kidney disease: Secondary | ICD-10-CM | POA: Diagnosis not present

## 2023-11-19 DIAGNOSIS — E039 Hypothyroidism, unspecified: Secondary | ICD-10-CM | POA: Insufficient documentation

## 2023-11-19 DIAGNOSIS — Z992 Dependence on renal dialysis: Secondary | ICD-10-CM | POA: Insufficient documentation

## 2023-11-19 DIAGNOSIS — N186 End stage renal disease: Secondary | ICD-10-CM | POA: Diagnosis not present

## 2023-11-19 DIAGNOSIS — J449 Chronic obstructive pulmonary disease, unspecified: Secondary | ICD-10-CM | POA: Insufficient documentation

## 2023-11-19 DIAGNOSIS — R0789 Other chest pain: Secondary | ICD-10-CM | POA: Insufficient documentation

## 2023-11-19 DIAGNOSIS — Z7982 Long term (current) use of aspirin: Secondary | ICD-10-CM | POA: Insufficient documentation

## 2023-11-19 DIAGNOSIS — I120 Hypertensive chronic kidney disease with stage 5 chronic kidney disease or end stage renal disease: Secondary | ICD-10-CM | POA: Insufficient documentation

## 2023-11-19 DIAGNOSIS — E876 Hypokalemia: Secondary | ICD-10-CM | POA: Diagnosis not present

## 2023-11-19 DIAGNOSIS — J9811 Atelectasis: Secondary | ICD-10-CM | POA: Diagnosis not present

## 2023-11-19 DIAGNOSIS — I2510 Atherosclerotic heart disease of native coronary artery without angina pectoris: Secondary | ICD-10-CM | POA: Insufficient documentation

## 2023-11-19 DIAGNOSIS — I959 Hypotension, unspecified: Secondary | ICD-10-CM | POA: Diagnosis not present

## 2023-11-19 DIAGNOSIS — D631 Anemia in chronic kidney disease: Secondary | ICD-10-CM | POA: Diagnosis not present

## 2023-11-19 DIAGNOSIS — N2581 Secondary hyperparathyroidism of renal origin: Secondary | ICD-10-CM | POA: Diagnosis not present

## 2023-11-19 DIAGNOSIS — R079 Chest pain, unspecified: Secondary | ICD-10-CM | POA: Diagnosis not present

## 2023-11-19 LAB — BASIC METABOLIC PANEL WITH GFR
Anion gap: 17 — ABNORMAL HIGH (ref 5–15)
BUN: 23 mg/dL (ref 8–23)
CO2: 24 mmol/L (ref 22–32)
Calcium: 8.8 mg/dL — ABNORMAL LOW (ref 8.9–10.3)
Chloride: 93 mmol/L — ABNORMAL LOW (ref 98–111)
Creatinine, Ser: 5.52 mg/dL — ABNORMAL HIGH (ref 0.61–1.24)
GFR, Estimated: 10 mL/min — ABNORMAL LOW (ref >60)
Glucose, Bld: 119 mg/dL — ABNORMAL HIGH (ref 70–99)
Potassium: 3.4 mmol/L — ABNORMAL LOW (ref 3.5–5.1)
Sodium: 134 mmol/L — ABNORMAL LOW (ref 135–145)

## 2023-11-19 LAB — CBC
HCT: 29.6 % — ABNORMAL LOW (ref 39.0–52.0)
Hemoglobin: 8.9 g/dL — ABNORMAL LOW (ref 13.0–17.0)
MCH: 27.6 pg (ref 26.0–34.0)
MCHC: 30.1 g/dL (ref 30.0–36.0)
MCV: 91.9 fL (ref 80.0–100.0)
Platelets: 129 10*3/uL — ABNORMAL LOW (ref 150–400)
RBC: 3.22 MIL/uL — ABNORMAL LOW (ref 4.22–5.81)
RDW: 19.4 % — ABNORMAL HIGH (ref 11.5–15.5)
WBC: 5.8 10*3/uL (ref 4.0–10.5)
nRBC: 0 % (ref 0.0–0.2)

## 2023-11-19 LAB — TROPONIN I (HIGH SENSITIVITY)
Troponin I (High Sensitivity): 66 ng/L — ABNORMAL HIGH (ref <18)
Troponin I (High Sensitivity): 63 ng/L — ABNORMAL HIGH (ref <18)

## 2023-11-19 NOTE — ED Provider Notes (Signed)
 [place] EMERGENCY DEPARTMENT AT [facility] Provider Note   CSN: [id number] Arrival date & time: 11/19/23  1027     History  Chief Complaint  Patient presents with   Chest Pain    [name] [name] is a 73 y.o. male.  Pt is a 73 yo male with pmhx significant for CAD, ESRD on HD (MWF), DM, COPD, GERD, HLD, HTN, arthritis, and hypothyroidism.  Pt was at dialysis today and had some CP.  CP gone after several belches.  Pt has no cp now.  Pt was given 1 nitroglycerin  by EMS.  Pt denies sob.  Dialysis had about 40 min to go, so he had most of the treatment.        Home Medications Prior to Admission medications   Medication Sig Start Date End Date Taking? Authorizing Provider  acetaminophen  (TYLENOL ) 650 MG CR tablet Take 1,300 mg by mouth every 8 (eight) hours as needed for pain.    [provider]  albuterol  (VENTOLIN  HFA) 108 (90 Base) MCG/ACT inhaler Inhale 2 puffs into the lungs every 6 (six) hours as needed for wheezing or shortness of breath. 08/26/23   Colin Dawley, MD  amLODipine  (NORVASC ) 5 MG tablet Take 1 tablet (5 mg total) by mouth daily. 08/26/23   Colin Dawley, MD  ascorbic acid  (VITAMIN C ) 500 MG tablet Take 1 tablet (500 mg total) by mouth daily. 05/07/23   Setzer, Hamp Levine, PA-C  aspirin  EC 81 MG tablet Take 81 mg by mouth daily. Swallow whole.    [provider]  atorvastatin  (LIPITOR ) 80 MG tablet Take 1 tablet (80 mg total) by mouth daily. 06/20/22   Allegra Arch, PA-C  esomeprazole  (NEXIUM ) 40 MG capsule Take 1 capsule (40 mg total) by mouth 2 (two) times daily before a meal. 07/16/23   Delman Ferns, NP  ferric citrate (AURYXIA) 1 GM 210 MG(Fe) tablet Take 420 mg by mouth 3 (three) times daily with meals.    [provider]  fluticasone  furoate-vilanterol (BREO ELLIPTA ) 200-25 MCG/ACT AEPB Use 1 (one) inhaltion once a day. 08/26/23   Colin Dawley, MD  furosemide  (LASIX ) 80 MG tablet Take 80 mg by mouth 4 (four)  times a week. Tues, Thurs, Sat, Sun (non-dialysis days)    [provider]  HYDROcodone -acetaminophen  (NORCO/VICODIN) 5-325 MG tablet Take 1 tablet by mouth every 6 (six) hours as needed for moderate pain (pain score 4-6). 11/13/23   Cordie Deters, PA-C  tamsulosin (FLOMAX) 0.4 MG CAPS capsule Take 0.4 mg by mouth daily after supper. 09/17/23   [provider]      Allergies    Tramadol  and Opana [oxymorphone hcl]    Review of Systems   Review of Systems  Cardiovascular:  Positive for chest pain.  All other systems reviewed and are negative.   Physical Exam Updated Vital Signs BP 102/70 (BP Location: Right Arm)   Pulse (!) 54   Temp 97.8 F (36.6 C) (Temporal)   Resp 20   Ht 5\' 4"  (1.626 m)   Wt 98 kg   SpO2 100%   BMI 37.08 kg/m  Physical Exam Vitals and nursing note reviewed.  Constitutional:      Appearance: He is well-developed. He is obese.  HENT:     Head: Normocephalic and atraumatic.  Eyes:     Extraocular Movements: Extraocular movements intact.     Pupils: Pupils are equal, round, and reactive to light.  Cardiovascular:     Rate and  Rhythm: Normal rate and regular rhythm.     Heart sounds: Normal heart sounds.  Pulmonary:     Effort: Pulmonary effort is normal.     Breath sounds: Normal breath sounds.  Abdominal:     General: Bowel sounds are normal.     Palpations: Abdomen is soft.  Musculoskeletal:        General: Normal range of motion.     Cervical back: Normal range of motion and neck supple.  Skin:    General: Skin is warm.     Capillary Refill: Capillary refill takes less than 2 seconds.  Neurological:     General: No focal deficit present.     Mental Status: He is alert and oriented to person, place, and time.  Psychiatric:        Mood and Affect: Mood normal.        Behavior: Behavior normal.     ED Results / Procedures / Treatments   Labs (all labs ordered are listed, but only abnormal results are displayed) Labs  Reviewed  BASIC METABOLIC PANEL WITH GFR - Abnormal; Notable for the following components:      Result Value   Sodium 134 (*)    Potassium 3.4 (*)    Chloride 93 (*)    Glucose, Bld 119 (*)    Creatinine, Ser 5.52 (*)    Calcium  8.8 (*)    GFR, Estimated 10 (*)    Anion gap 17 (*)    All other components within normal limits  CBC - Abnormal; Notable for the following components:   RBC 3.22 (*)    Hemoglobin 8.9 (*)    HCT 29.6 (*)    RDW 19.4 (*)    Platelets 129 (*)    All other components within normal limits  TROPONIN I (HIGH SENSITIVITY) - Abnormal; Notable for the following components:   Troponin I (High Sensitivity) 66 (*)    All other components within normal limits  TROPONIN I (HIGH SENSITIVITY) - Abnormal; Notable for the following components:   Troponin I (High Sensitivity) 63 (*)    All other components within normal limits    EKG EKG Interpretation Date/Time:  Monday Nov 19 2023 10:16:22 EDT Ventricular Rate:  56 PR Interval:    QRS Duration:  122 QT Interval:  466 QTC Calculation: 449 R Axis:   -1  Text Interpretation: Sinus rhythm with 2nd degree A-V block (Mobitz I) Left bundle branch block Abnormal ECG When compared with ECG of 24-Aug-2023 14:59, PREVIOUS ECG IS PRESENT No significant change since last tracing Confirmed by Sueellen Emery [phone]) on 11/19/2023 11:52:52 AM  Radiology DG Chest 2 View Result Date: 11/19/2023 CLINICAL DATA:  Chest pain during hemodialysis. EXAM: CHEST - 2 VIEW COMPARISON:  Radiographs 11/13/2023 and 08/24/2023.  CT 08/15/2021. FINDINGS: Right IJ hemodialysis catheter extends to the mid SVC level, unchanged. The heart size and mediastinal contours are stable post median sternotomy and CABG. Stable mild atelectasis at both lung bases. No evidence of edema, significant pleural effusion or pneumothorax. No acute osseous findings are identified. IMPRESSION: Stable chest. No acute cardiopulmonary process. Electronically Signed   By:  Elmon Hagedorn M.D.   On: 11/19/2023 11:10    Procedures Procedures    Medications Ordered in ED Medications - No data to display  ED Course/ Medical Decision Making/ A&P  Medical Decision Making Amount and/or Complexity of Data Reviewed Labs: ordered. Radiology: ordered.   This patient presents to the ED for concern of cp, this involves an extensive number of treatment options, and is a complaint that carries with it a high risk of complications and morbidity.  The differential diagnosis includes cardiac, pulm, gi   Co morbidities that complicate the patient evaluation  CAD, ESRD on HD (MWF), DM, COPD, GERD, HLD, HTN, arthritis, and hypothyroidism.   Additional history obtained:  Additional history obtained from epic chart review External records from outside source obtained and reviewed including EMS report/wife   Lab Tests:  I Ordered, and personally interpreted labs.  The pertinent results include:  cbc with hgb 8.9 (10.2 on 4/29, but 8.7 in March); bmp nl other than cr 5.52 (chronic); trop 66 and 63 (107 in August)   Imaging Studies ordered:  I ordered imaging studies including cxr  I independently visualized and interpreted imaging which showed Stable chest. No acute cardiopulmonary process.  I agree with the radiologist interpretation   Cardiac Monitoring:  The patient was maintained on a cardiac monitor.  I personally viewed and interpreted the cardiac monitored which showed an underlying rhythm of: sb   Medicines ordered and prescription drug management:   I have reviewed the patients home medicines and have made adjustments as needed  Problem List / ED Course:  CP:  relieved with belching, so likely GI.  Pt's cardiac work up is unchanged.  Pt is stable for d/c.  Return if worse.  F/u with cards.     Reevaluation:  After the interventions noted above, I reevaluated the patient and found that they have  :improved   Social Determinants of Health:  Lives at home   Dispostion:  After consideration of the diagnostic results and the patients response to treatment, I feel that the patent would benefit from discharge with outpatient f/u.          Final Clinical Impression(s) / ED Diagnoses Final diagnoses:  Atypical chest pain  ESRD on hemodialysis [facility])    Rx / DC Orders ED Discharge Orders     None         Sueellen Emery, MD 11/19/23 1303

## 2023-11-19 NOTE — ED Triage Notes (Signed)
 Pt arrived via [facility] who reports he developed CP during his treatment. Pt reports he received 1 NTG PTA with relief. Pt reports he belched 3 times as well and chest pain and pressure was relieved. Pt is MWF Dialysis Pt, with LUE Restriction.

## 2023-11-20 ENCOUNTER — Telehealth: Payer: Self-pay

## 2023-11-20 NOTE — Telephone Encounter (Signed)
 I discontinued the BREO. I can't tell what dose of Trelegy patient was given. Can you ask and ok to send in prescription since patient is benefiting from triple therapy

## 2023-11-20 NOTE — Telephone Encounter (Unsigned)
 Copied from CRM [id number]. Topic: Clinical - Prescription Issue >> Nov 20, 2023 11:14 AM Evie Hoff wrote: Reason for CRM: patient wife is calling cause patient sees Ms or Dr Dana Duncan and she gave him samples of trelegy and she told him to call back if this helps better than the other inhaler he was taking and patient wife says the trelegy helps much better that the other inhaler . Patient wife  is requesting that the Dr call in trelegy at [facility] - [place], [place] - [address] [place] [address] Phone: [phone] Fax: [phone] Hours: Not open 24 hours  luticasone furoate-vilanterol (BREO ELLIPTA ) 200-25 MCG/ACT AEPB doesn't work as good as the trelegy patient wife says he doesn't have to us  e the albuterol  as much .  Patient wife is requesting that you cancel the BREO ELLIPTA 

## 2023-11-21 DIAGNOSIS — D631 Anemia in chronic kidney disease: Secondary | ICD-10-CM | POA: Diagnosis not present

## 2023-11-21 DIAGNOSIS — D509 Iron deficiency anemia, unspecified: Secondary | ICD-10-CM | POA: Diagnosis not present

## 2023-11-21 DIAGNOSIS — Z992 Dependence on renal dialysis: Secondary | ICD-10-CM | POA: Diagnosis not present

## 2023-11-21 DIAGNOSIS — N186 End stage renal disease: Secondary | ICD-10-CM | POA: Diagnosis not present

## 2023-11-21 DIAGNOSIS — N2581 Secondary hyperparathyroidism of renal origin: Secondary | ICD-10-CM | POA: Diagnosis not present

## 2023-11-21 NOTE — Telephone Encounter (Signed)
 LVM for pt to return call.   Need to know what strength the Trelegy is.

## 2023-11-22 MED ORDER — TRELEGY ELLIPTA 200-62.5-25 MCG/ACT IN AEPB: 1.0000 | INHALATION_SPRAY | Freq: Every day | RESPIRATORY_TRACT | 5 refills | Status: DC

## 2023-11-22 NOTE — Telephone Encounter (Signed)
 Copied from CRM [id number]. Topic: Clinical - Prescription Issue >> Nov 22, 2023 10:59 AM Tyronne Galloway wrote: Reason for CRM: Adriana Hopping called (spouse) to relay that the strength of the Trelegy is 200 MG/62.50mcg/25mg . NP Eulas Hick was wanting to know what the strength of the Trelegy was for the prescription to be called in.   Rx for Trelegy 200 sent to [facility] as requested by patient and approved by provider. Nothing further needed at this time.

## 2023-11-23 DIAGNOSIS — D631 Anemia in chronic kidney disease: Secondary | ICD-10-CM | POA: Diagnosis not present

## 2023-11-23 DIAGNOSIS — Z992 Dependence on renal dialysis: Secondary | ICD-10-CM | POA: Diagnosis not present

## 2023-11-23 DIAGNOSIS — D509 Iron deficiency anemia, unspecified: Secondary | ICD-10-CM | POA: Diagnosis not present

## 2023-11-23 DIAGNOSIS — N2581 Secondary hyperparathyroidism of renal origin: Secondary | ICD-10-CM | POA: Diagnosis not present

## 2023-11-23 DIAGNOSIS — N186 End stage renal disease: Secondary | ICD-10-CM | POA: Diagnosis not present

## 2023-11-26 DIAGNOSIS — D631 Anemia in chronic kidney disease: Secondary | ICD-10-CM | POA: Diagnosis not present

## 2023-11-26 DIAGNOSIS — D509 Iron deficiency anemia, unspecified: Secondary | ICD-10-CM | POA: Diagnosis not present

## 2023-11-26 DIAGNOSIS — N2581 Secondary hyperparathyroidism of renal origin: Secondary | ICD-10-CM | POA: Diagnosis not present

## 2023-11-26 DIAGNOSIS — Z992 Dependence on renal dialysis: Secondary | ICD-10-CM | POA: Diagnosis not present

## 2023-11-26 DIAGNOSIS — N186 End stage renal disease: Secondary | ICD-10-CM | POA: Diagnosis not present

## 2023-11-28 DIAGNOSIS — Z992 Dependence on renal dialysis: Secondary | ICD-10-CM | POA: Diagnosis not present

## 2023-11-28 DIAGNOSIS — N2581 Secondary hyperparathyroidism of renal origin: Secondary | ICD-10-CM | POA: Diagnosis not present

## 2023-11-28 DIAGNOSIS — D509 Iron deficiency anemia, unspecified: Secondary | ICD-10-CM | POA: Diagnosis not present

## 2023-11-28 DIAGNOSIS — N186 End stage renal disease: Secondary | ICD-10-CM | POA: Diagnosis not present

## 2023-11-28 DIAGNOSIS — D631 Anemia in chronic kidney disease: Secondary | ICD-10-CM | POA: Diagnosis not present

## 2023-11-29 ENCOUNTER — Encounter: Payer: Self-pay | Admitting: Nurse Practitioner

## 2023-11-29 ENCOUNTER — Ambulatory Visit: Admitting: Nurse Practitioner

## 2023-11-30 ENCOUNTER — Other Ambulatory Visit: Payer: Self-pay | Admitting: *Deleted

## 2023-11-30 DIAGNOSIS — D509 Iron deficiency anemia, unspecified: Secondary | ICD-10-CM | POA: Diagnosis not present

## 2023-11-30 DIAGNOSIS — Z992 Dependence on renal dialysis: Secondary | ICD-10-CM | POA: Diagnosis not present

## 2023-11-30 DIAGNOSIS — D631 Anemia in chronic kidney disease: Secondary | ICD-10-CM | POA: Diagnosis not present

## 2023-11-30 DIAGNOSIS — N186 End stage renal disease: Secondary | ICD-10-CM | POA: Diagnosis not present

## 2023-11-30 DIAGNOSIS — N2581 Secondary hyperparathyroidism of renal origin: Secondary | ICD-10-CM | POA: Diagnosis not present

## 2023-12-03 DIAGNOSIS — D509 Iron deficiency anemia, unspecified: Secondary | ICD-10-CM | POA: Diagnosis not present

## 2023-12-03 DIAGNOSIS — D631 Anemia in chronic kidney disease: Secondary | ICD-10-CM | POA: Diagnosis not present

## 2023-12-03 DIAGNOSIS — Z992 Dependence on renal dialysis: Secondary | ICD-10-CM | POA: Diagnosis not present

## 2023-12-03 DIAGNOSIS — N186 End stage renal disease: Secondary | ICD-10-CM | POA: Diagnosis not present

## 2023-12-03 DIAGNOSIS — N2581 Secondary hyperparathyroidism of renal origin: Secondary | ICD-10-CM | POA: Diagnosis not present

## 2023-12-05 DIAGNOSIS — D509 Iron deficiency anemia, unspecified: Secondary | ICD-10-CM | POA: Diagnosis not present

## 2023-12-05 DIAGNOSIS — D631 Anemia in chronic kidney disease: Secondary | ICD-10-CM | POA: Diagnosis not present

## 2023-12-05 DIAGNOSIS — N2581 Secondary hyperparathyroidism of renal origin: Secondary | ICD-10-CM | POA: Diagnosis not present

## 2023-12-05 DIAGNOSIS — Z992 Dependence on renal dialysis: Secondary | ICD-10-CM | POA: Diagnosis not present

## 2023-12-05 DIAGNOSIS — N186 End stage renal disease: Secondary | ICD-10-CM | POA: Diagnosis not present

## 2023-12-07 DIAGNOSIS — D509 Iron deficiency anemia, unspecified: Secondary | ICD-10-CM | POA: Diagnosis not present

## 2023-12-07 DIAGNOSIS — Z992 Dependence on renal dialysis: Secondary | ICD-10-CM | POA: Diagnosis not present

## 2023-12-07 DIAGNOSIS — N186 End stage renal disease: Secondary | ICD-10-CM | POA: Diagnosis not present

## 2023-12-07 DIAGNOSIS — N2581 Secondary hyperparathyroidism of renal origin: Secondary | ICD-10-CM | POA: Diagnosis not present

## 2023-12-07 DIAGNOSIS — D631 Anemia in chronic kidney disease: Secondary | ICD-10-CM | POA: Diagnosis not present

## 2023-12-10 DIAGNOSIS — N2581 Secondary hyperparathyroidism of renal origin: Secondary | ICD-10-CM | POA: Diagnosis not present

## 2023-12-10 DIAGNOSIS — Z992 Dependence on renal dialysis: Secondary | ICD-10-CM | POA: Diagnosis not present

## 2023-12-10 DIAGNOSIS — D509 Iron deficiency anemia, unspecified: Secondary | ICD-10-CM | POA: Diagnosis not present

## 2023-12-10 DIAGNOSIS — N186 End stage renal disease: Secondary | ICD-10-CM | POA: Diagnosis not present

## 2023-12-10 DIAGNOSIS — D631 Anemia in chronic kidney disease: Secondary | ICD-10-CM | POA: Diagnosis not present

## 2023-12-11 ENCOUNTER — Ambulatory Visit: Attending: Surgery | Admitting: Physician Assistant

## 2023-12-11 ENCOUNTER — Ambulatory Visit (HOSPITAL_COMMUNITY)
Admission: RE | Admit: 2023-12-11 | Discharge: 2023-12-11 | Disposition: A | Discharge status: Home / Self Care | Source: Ambulatory Visit | Attending: Physician Assistant | Admitting: Physician Assistant

## 2023-12-11 VITALS — BP 122/75 | HR 53 | Temp 97.2°F | Resp 18 | Ht 64.0 in

## 2023-12-11 DIAGNOSIS — N186 End stage renal disease: Secondary | ICD-10-CM | POA: Insufficient documentation

## 2023-12-11 NOTE — Progress Notes (Signed)
 POST OPERATIVE OFFICE NOTE    CC:  F/u for surgery  HPI:  This is a 73 y.o. male who is s/p revision of left arm cephalic vein AVF with turndown to axillary vein and TDC placement on 11/13/2023 by Dr. Vikki Graves.  HD access hx: -left BC AVF 08/09/2016 by Dr. Nolene Baumgarten -removal PD catheter  02/19/2023 Dr. Rosalva Comber -fgm with balloon venoplasty and drug coated balloon venoplasty 02/28/2020 Dr. Rosalva Comber -revision of left arm cephalic vein AVF with turndown to axillary vein and TDC placement on 11/13/2023 by Dr. Vikki Graves.  Pt comes in today for follow up and here with his wife of 54 years.  He states he does have some numbness in the fingers of the left hand.  He denies any pain or clumsiness with the left hand.    The pt is on dialysis M/W/F at [facility] location in [place].     Allergies  Allergen Reactions   Tramadol  Itching   Opana [Oxymorphone Hcl] Itching    Current Outpatient Medications  Medication Sig Dispense Refill   acetaminophen  (TYLENOL ) 650 MG CR tablet Take 1,300 mg by mouth every 8 (eight) hours as needed for pain.     albuterol  (VENTOLIN  HFA) 108 (90 Base) MCG/ACT inhaler Inhale 2 puffs into the lungs every 6 (six) hours as needed for wheezing or shortness of breath. 18 g 11   amLODipine  (NORVASC ) 5 MG tablet Take 1 tablet (5 mg total) by mouth daily. 30 tablet 5   ascorbic acid  (VITAMIN C ) 500 MG tablet Take 1 tablet (500 mg total) by mouth daily. 100 tablet 0   aspirin  EC 81 MG tablet Take 81 mg by mouth daily. Swallow whole.     atorvastatin  (LIPITOR ) 80 MG tablet Take 1 tablet (80 mg total) by mouth daily. 30 tablet 1   esomeprazole  (NEXIUM ) 40 MG capsule Take 1 capsule (40 mg total) by mouth 2 (two) times daily before a meal. 180 capsule 0   ferric citrate (AURYXIA) 1 GM 210 MG(Fe) tablet Take 420 mg by mouth 3 (three) times daily with meals.     Fluticasone -Umeclidin-Vilant (TRELEGY ELLIPTA ) 200-62.5-25 MCG/ACT AEPB Inhale 1 puff into the lungs daily. 28 each 5   furosemide  (LASIX )  80 MG tablet Take 80 mg by mouth 4 (four) times a week. Tues, Thurs, Sat, Sun (non-dialysis days)     HYDROcodone -acetaminophen  (NORCO/VICODIN) 5-325 MG tablet Take 1 tablet by mouth every 6 (six) hours as needed for moderate pain (pain score 4-6). 15 tablet 0   tamsulosin (FLOMAX) 0.4 MG CAPS capsule Take 0.4 mg by mouth daily after supper.     No current facility-administered medications for this visit.     ROS:  See HPI  Physical Exam:  Today's Vitals   12/11/23 1007 12/11/23 1009  BP: 122/75   Pulse: (!) 53   Resp: 18   Temp: (!) 97.2 F (36.2 C)   TempSrc: Temporal   SpO2: 93%   Height: 5\' 4"  (1.626 m)   PainSc: 4  4    Body mass index is 37.08 kg/m.   Incision:  all incisions are well healed. Extremities:   There is not a palpable left radial or ulnar pulse.  He does have brisk doppler flow left radial and ulnar and palmar arch.  The radial pulse augments slightly with compression of the fistula.   Motor and sensory are in tact.   There is a thrill/bruit present.  Access is  easily palpable   Dialysis Duplex on 12/11/2023: Findings:  +--------------------+----------+-----------------+--------+  AVF                PSV (cm/s)Flow Vol (mL/min)Comments  +--------------------+----------+-----------------+--------+  Native artery inflow   123           990                 +--------------------+----------+-----------------+--------+  AVF Anastomosis        231                               +--------------------+----------+-----------------+--------+     +------------+----------+-------------+----------+--------+  OUTFLOW VEINPSV (cm/s)Diameter (cm)Depth (cm)Describe  +------------+----------+-------------+----------+--------+  Shoulder       65        1.17        3.00             +------------+----------+-------------+----------+--------+  Prox UA        166        0.59        1.02              +------------+----------+-------------+----------+--------+  Mid UA         312        0.62        0.95             +------------+----------+-------------+----------+--------+  Dist UA         68        1.58        0.39             +------------+----------+-------------+----------+--------+  AC Fossa       113        0.81        0.33             +------------+----------+-------------+----------+--------+   Palpable, avascularized structure within left axilla 1.26 x 1.84 cm.  Left radial and ulnar arteries are patent and antegrade at wrist.    Assessment/Plan:  This is a 73 y.o. male who is s/p: revision of left arm cephalic vein AVF with turndown to axillary vein and TDC placement on 11/13/2023 by Dr. Vikki Graves.  -the pt does have some numbness in the fingers on the left hand.  Motor and sensory are in tact.  He denies any clumsiness of the left hand.  Discussed steal syndrome with he and his wife and he knows that if this worsens, he will call.   -pt's access can be used 12/17/2023. His duplex did have elevated velocities in the mid UA, however, the graft is not pulsatile and has an excellent thrill.  Discussed with Dr. Fulton Job and will let them access the fistula and if there is any issues, we can proceed with fistulogram.   Discussed this with he and his wife.  They expressed good understanding.  -if pt has tunneled catheter, this can be removed at the discretion of the dialysis center once the pt's access has been successfully cannulated to their satisfaction.  -discussed with pt that access does not last forever and will need intervention or even new access at some point.  -the pt will follow up as needed.    Maryanna Smart, [facility] [phone]  Clinic MD:  Fulton Job

## 2023-12-12 DIAGNOSIS — N186 End stage renal disease: Secondary | ICD-10-CM | POA: Diagnosis not present

## 2023-12-12 DIAGNOSIS — D509 Iron deficiency anemia, unspecified: Secondary | ICD-10-CM | POA: Diagnosis not present

## 2023-12-12 DIAGNOSIS — Z992 Dependence on renal dialysis: Secondary | ICD-10-CM | POA: Diagnosis not present

## 2023-12-12 DIAGNOSIS — D631 Anemia in chronic kidney disease: Secondary | ICD-10-CM | POA: Diagnosis not present

## 2023-12-12 DIAGNOSIS — N2581 Secondary hyperparathyroidism of renal origin: Secondary | ICD-10-CM | POA: Diagnosis not present

## 2023-12-14 DIAGNOSIS — Z992 Dependence on renal dialysis: Secondary | ICD-10-CM | POA: Diagnosis not present

## 2023-12-14 DIAGNOSIS — D509 Iron deficiency anemia, unspecified: Secondary | ICD-10-CM | POA: Diagnosis not present

## 2023-12-14 DIAGNOSIS — D631 Anemia in chronic kidney disease: Secondary | ICD-10-CM | POA: Diagnosis not present

## 2023-12-14 DIAGNOSIS — N2581 Secondary hyperparathyroidism of renal origin: Secondary | ICD-10-CM | POA: Diagnosis not present

## 2023-12-14 DIAGNOSIS — N186 End stage renal disease: Secondary | ICD-10-CM | POA: Diagnosis not present

## 2023-12-15 DIAGNOSIS — Z992 Dependence on renal dialysis: Secondary | ICD-10-CM | POA: Diagnosis not present

## 2023-12-15 DIAGNOSIS — N186 End stage renal disease: Secondary | ICD-10-CM | POA: Diagnosis not present

## 2023-12-16 DIAGNOSIS — N186 End stage renal disease: Secondary | ICD-10-CM | POA: Diagnosis not present

## 2023-12-16 DIAGNOSIS — D509 Iron deficiency anemia, unspecified: Secondary | ICD-10-CM | POA: Diagnosis not present

## 2023-12-16 DIAGNOSIS — N250 Renal osteodystrophy: Secondary | ICD-10-CM | POA: Diagnosis not present

## 2023-12-16 DIAGNOSIS — Z992 Dependence on renal dialysis: Secondary | ICD-10-CM | POA: Diagnosis not present

## 2023-12-16 DIAGNOSIS — N2581 Secondary hyperparathyroidism of renal origin: Secondary | ICD-10-CM | POA: Diagnosis not present

## 2023-12-16 DIAGNOSIS — D631 Anemia in chronic kidney disease: Secondary | ICD-10-CM | POA: Diagnosis not present

## 2023-12-17 DIAGNOSIS — D631 Anemia in chronic kidney disease: Secondary | ICD-10-CM | POA: Diagnosis not present

## 2023-12-17 DIAGNOSIS — N186 End stage renal disease: Secondary | ICD-10-CM | POA: Diagnosis not present

## 2023-12-17 DIAGNOSIS — N2581 Secondary hyperparathyroidism of renal origin: Secondary | ICD-10-CM | POA: Diagnosis not present

## 2023-12-17 DIAGNOSIS — N250 Renal osteodystrophy: Secondary | ICD-10-CM | POA: Diagnosis not present

## 2023-12-17 DIAGNOSIS — Z992 Dependence on renal dialysis: Secondary | ICD-10-CM | POA: Diagnosis not present

## 2023-12-17 DIAGNOSIS — D509 Iron deficiency anemia, unspecified: Secondary | ICD-10-CM | POA: Diagnosis not present

## 2023-12-19 DIAGNOSIS — N2581 Secondary hyperparathyroidism of renal origin: Secondary | ICD-10-CM | POA: Diagnosis not present

## 2023-12-19 DIAGNOSIS — D509 Iron deficiency anemia, unspecified: Secondary | ICD-10-CM | POA: Diagnosis not present

## 2023-12-19 DIAGNOSIS — Z992 Dependence on renal dialysis: Secondary | ICD-10-CM | POA: Diagnosis not present

## 2023-12-19 DIAGNOSIS — N186 End stage renal disease: Secondary | ICD-10-CM | POA: Diagnosis not present

## 2023-12-19 DIAGNOSIS — D631 Anemia in chronic kidney disease: Secondary | ICD-10-CM | POA: Diagnosis not present

## 2023-12-19 DIAGNOSIS — N250 Renal osteodystrophy: Secondary | ICD-10-CM | POA: Diagnosis not present

## 2023-12-21 DIAGNOSIS — N186 End stage renal disease: Secondary | ICD-10-CM | POA: Diagnosis not present

## 2023-12-21 DIAGNOSIS — D631 Anemia in chronic kidney disease: Secondary | ICD-10-CM | POA: Diagnosis not present

## 2023-12-21 DIAGNOSIS — D509 Iron deficiency anemia, unspecified: Secondary | ICD-10-CM | POA: Diagnosis not present

## 2023-12-21 DIAGNOSIS — N250 Renal osteodystrophy: Secondary | ICD-10-CM | POA: Diagnosis not present

## 2023-12-21 DIAGNOSIS — N2581 Secondary hyperparathyroidism of renal origin: Secondary | ICD-10-CM | POA: Diagnosis not present

## 2023-12-21 DIAGNOSIS — Z992 Dependence on renal dialysis: Secondary | ICD-10-CM | POA: Diagnosis not present

## 2023-12-24 DIAGNOSIS — N2581 Secondary hyperparathyroidism of renal origin: Secondary | ICD-10-CM | POA: Diagnosis not present

## 2023-12-24 DIAGNOSIS — Z992 Dependence on renal dialysis: Secondary | ICD-10-CM | POA: Diagnosis not present

## 2023-12-24 DIAGNOSIS — D631 Anemia in chronic kidney disease: Secondary | ICD-10-CM | POA: Diagnosis not present

## 2023-12-24 DIAGNOSIS — N186 End stage renal disease: Secondary | ICD-10-CM | POA: Diagnosis not present

## 2023-12-24 DIAGNOSIS — D509 Iron deficiency anemia, unspecified: Secondary | ICD-10-CM | POA: Diagnosis not present

## 2023-12-24 DIAGNOSIS — N250 Renal osteodystrophy: Secondary | ICD-10-CM | POA: Diagnosis not present

## 2023-12-25 ENCOUNTER — Ambulatory Visit: Admitting: Cardiology

## 2023-12-26 ENCOUNTER — Telehealth: Payer: Self-pay | Admitting: Internal Medicine

## 2023-12-26 DIAGNOSIS — Z992 Dependence on renal dialysis: Secondary | ICD-10-CM | POA: Diagnosis not present

## 2023-12-26 DIAGNOSIS — N2581 Secondary hyperparathyroidism of renal origin: Secondary | ICD-10-CM | POA: Diagnosis not present

## 2023-12-26 DIAGNOSIS — D509 Iron deficiency anemia, unspecified: Secondary | ICD-10-CM | POA: Diagnosis not present

## 2023-12-26 DIAGNOSIS — N250 Renal osteodystrophy: Secondary | ICD-10-CM | POA: Diagnosis not present

## 2023-12-26 DIAGNOSIS — D631 Anemia in chronic kidney disease: Secondary | ICD-10-CM | POA: Diagnosis not present

## 2023-12-26 DIAGNOSIS — N186 End stage renal disease: Secondary | ICD-10-CM | POA: Diagnosis not present

## 2023-12-26 NOTE — Telephone Encounter (Signed)
 Pt c/o medication issue:  1. Name of Medication:   Antibiotics  2. How are you currently taking this medication (dosage and times per day)?   3. Are you having a reaction (difficulty breathing--STAT)?   4. What is your medication issue?   Patient stated he will be having dental work done Saturday (6/14) and wants to know if he will need to take antibiotics prior to the visit.

## 2023-12-26 NOTE — Telephone Encounter (Signed)
 Will send to Dr Maximo Spar for review if SBE is needed.

## 2023-12-28 DIAGNOSIS — D631 Anemia in chronic kidney disease: Secondary | ICD-10-CM | POA: Diagnosis not present

## 2023-12-28 DIAGNOSIS — N2581 Secondary hyperparathyroidism of renal origin: Secondary | ICD-10-CM | POA: Diagnosis not present

## 2023-12-28 DIAGNOSIS — Z992 Dependence on renal dialysis: Secondary | ICD-10-CM | POA: Diagnosis not present

## 2023-12-28 DIAGNOSIS — N250 Renal osteodystrophy: Secondary | ICD-10-CM | POA: Diagnosis not present

## 2023-12-28 DIAGNOSIS — N186 End stage renal disease: Secondary | ICD-10-CM | POA: Diagnosis not present

## 2023-12-28 DIAGNOSIS — D509 Iron deficiency anemia, unspecified: Secondary | ICD-10-CM | POA: Diagnosis not present

## 2023-12-31 DIAGNOSIS — N250 Renal osteodystrophy: Secondary | ICD-10-CM | POA: Diagnosis not present

## 2023-12-31 DIAGNOSIS — N2581 Secondary hyperparathyroidism of renal origin: Secondary | ICD-10-CM | POA: Diagnosis not present

## 2023-12-31 DIAGNOSIS — Z992 Dependence on renal dialysis: Secondary | ICD-10-CM | POA: Diagnosis not present

## 2023-12-31 DIAGNOSIS — N186 End stage renal disease: Secondary | ICD-10-CM | POA: Diagnosis not present

## 2023-12-31 DIAGNOSIS — D631 Anemia in chronic kidney disease: Secondary | ICD-10-CM | POA: Diagnosis not present

## 2023-12-31 DIAGNOSIS — D509 Iron deficiency anemia, unspecified: Secondary | ICD-10-CM | POA: Diagnosis not present

## 2023-12-31 NOTE — Telephone Encounter (Signed)
 Hazle Lites, MD to [facility] Triage (Selected Message)     12/31/23  2:01 PM No cardiac reason for dental antibiotic prophylaxis that I'm aware of.   Dr. Maximo Spar __________________________________________________  Left detailed message (DPR) of results above.

## 2024-01-02 DIAGNOSIS — Z992 Dependence on renal dialysis: Secondary | ICD-10-CM | POA: Diagnosis not present

## 2024-01-02 DIAGNOSIS — N2581 Secondary hyperparathyroidism of renal origin: Secondary | ICD-10-CM | POA: Diagnosis not present

## 2024-01-02 DIAGNOSIS — D509 Iron deficiency anemia, unspecified: Secondary | ICD-10-CM | POA: Diagnosis not present

## 2024-01-02 DIAGNOSIS — N186 End stage renal disease: Secondary | ICD-10-CM | POA: Diagnosis not present

## 2024-01-02 DIAGNOSIS — N250 Renal osteodystrophy: Secondary | ICD-10-CM | POA: Diagnosis not present

## 2024-01-02 DIAGNOSIS — D631 Anemia in chronic kidney disease: Secondary | ICD-10-CM | POA: Diagnosis not present

## 2024-01-03 DIAGNOSIS — E1165 Type 2 diabetes mellitus with hyperglycemia: Secondary | ICD-10-CM | POA: Diagnosis not present

## 2024-01-03 DIAGNOSIS — I10 Essential (primary) hypertension: Secondary | ICD-10-CM | POA: Diagnosis not present

## 2024-01-03 DIAGNOSIS — I2510 Atherosclerotic heart disease of native coronary artery without angina pectoris: Secondary | ICD-10-CM | POA: Diagnosis not present

## 2024-01-03 DIAGNOSIS — N186 End stage renal disease: Secondary | ICD-10-CM | POA: Diagnosis not present

## 2024-01-04 DIAGNOSIS — N186 End stage renal disease: Secondary | ICD-10-CM | POA: Diagnosis not present

## 2024-01-04 DIAGNOSIS — D509 Iron deficiency anemia, unspecified: Secondary | ICD-10-CM | POA: Diagnosis not present

## 2024-01-04 DIAGNOSIS — N250 Renal osteodystrophy: Secondary | ICD-10-CM | POA: Diagnosis not present

## 2024-01-04 DIAGNOSIS — N2581 Secondary hyperparathyroidism of renal origin: Secondary | ICD-10-CM | POA: Diagnosis not present

## 2024-01-04 DIAGNOSIS — Z992 Dependence on renal dialysis: Secondary | ICD-10-CM | POA: Diagnosis not present

## 2024-01-04 DIAGNOSIS — D631 Anemia in chronic kidney disease: Secondary | ICD-10-CM | POA: Diagnosis not present

## 2024-01-07 DIAGNOSIS — D509 Iron deficiency anemia, unspecified: Secondary | ICD-10-CM | POA: Diagnosis not present

## 2024-01-07 DIAGNOSIS — N186 End stage renal disease: Secondary | ICD-10-CM | POA: Diagnosis not present

## 2024-01-07 DIAGNOSIS — N250 Renal osteodystrophy: Secondary | ICD-10-CM | POA: Diagnosis not present

## 2024-01-07 DIAGNOSIS — Z992 Dependence on renal dialysis: Secondary | ICD-10-CM | POA: Diagnosis not present

## 2024-01-07 DIAGNOSIS — N2581 Secondary hyperparathyroidism of renal origin: Secondary | ICD-10-CM | POA: Diagnosis not present

## 2024-01-07 DIAGNOSIS — D631 Anemia in chronic kidney disease: Secondary | ICD-10-CM | POA: Diagnosis not present

## 2024-01-08 DIAGNOSIS — N186 End stage renal disease: Secondary | ICD-10-CM | POA: Diagnosis not present

## 2024-01-08 DIAGNOSIS — Z992 Dependence on renal dialysis: Secondary | ICD-10-CM | POA: Diagnosis not present

## 2024-01-09 DIAGNOSIS — N2581 Secondary hyperparathyroidism of renal origin: Secondary | ICD-10-CM | POA: Diagnosis not present

## 2024-01-09 DIAGNOSIS — N250 Renal osteodystrophy: Secondary | ICD-10-CM | POA: Diagnosis not present

## 2024-01-09 DIAGNOSIS — Z992 Dependence on renal dialysis: Secondary | ICD-10-CM | POA: Diagnosis not present

## 2024-01-09 DIAGNOSIS — D631 Anemia in chronic kidney disease: Secondary | ICD-10-CM | POA: Diagnosis not present

## 2024-01-09 DIAGNOSIS — D509 Iron deficiency anemia, unspecified: Secondary | ICD-10-CM | POA: Diagnosis not present

## 2024-01-09 DIAGNOSIS — N186 End stage renal disease: Secondary | ICD-10-CM | POA: Diagnosis not present

## 2024-01-10 DIAGNOSIS — E1142 Type 2 diabetes mellitus with diabetic polyneuropathy: Secondary | ICD-10-CM | POA: Diagnosis not present

## 2024-01-10 DIAGNOSIS — B351 Tinea unguium: Secondary | ICD-10-CM | POA: Diagnosis not present

## 2024-01-11 DIAGNOSIS — D509 Iron deficiency anemia, unspecified: Secondary | ICD-10-CM | POA: Diagnosis not present

## 2024-01-11 DIAGNOSIS — N2581 Secondary hyperparathyroidism of renal origin: Secondary | ICD-10-CM | POA: Diagnosis not present

## 2024-01-11 DIAGNOSIS — N186 End stage renal disease: Secondary | ICD-10-CM | POA: Diagnosis not present

## 2024-01-11 DIAGNOSIS — N250 Renal osteodystrophy: Secondary | ICD-10-CM | POA: Diagnosis not present

## 2024-01-11 DIAGNOSIS — Z992 Dependence on renal dialysis: Secondary | ICD-10-CM | POA: Diagnosis not present

## 2024-01-11 DIAGNOSIS — D631 Anemia in chronic kidney disease: Secondary | ICD-10-CM | POA: Diagnosis not present

## 2024-01-14 DIAGNOSIS — D631 Anemia in chronic kidney disease: Secondary | ICD-10-CM | POA: Diagnosis not present

## 2024-01-14 DIAGNOSIS — N250 Renal osteodystrophy: Secondary | ICD-10-CM | POA: Diagnosis not present

## 2024-01-14 DIAGNOSIS — N2581 Secondary hyperparathyroidism of renal origin: Secondary | ICD-10-CM | POA: Diagnosis not present

## 2024-01-14 DIAGNOSIS — Z992 Dependence on renal dialysis: Secondary | ICD-10-CM | POA: Diagnosis not present

## 2024-01-14 DIAGNOSIS — D509 Iron deficiency anemia, unspecified: Secondary | ICD-10-CM | POA: Diagnosis not present

## 2024-01-14 DIAGNOSIS — N186 End stage renal disease: Secondary | ICD-10-CM | POA: Diagnosis not present

## 2024-01-16 DIAGNOSIS — N250 Renal osteodystrophy: Secondary | ICD-10-CM | POA: Diagnosis not present

## 2024-01-16 DIAGNOSIS — D509 Iron deficiency anemia, unspecified: Secondary | ICD-10-CM | POA: Diagnosis not present

## 2024-01-16 DIAGNOSIS — D631 Anemia in chronic kidney disease: Secondary | ICD-10-CM | POA: Diagnosis not present

## 2024-01-16 DIAGNOSIS — N2581 Secondary hyperparathyroidism of renal origin: Secondary | ICD-10-CM | POA: Diagnosis not present

## 2024-01-16 DIAGNOSIS — Z992 Dependence on renal dialysis: Secondary | ICD-10-CM | POA: Diagnosis not present

## 2024-01-16 DIAGNOSIS — N186 End stage renal disease: Secondary | ICD-10-CM | POA: Diagnosis not present

## 2024-01-17 ENCOUNTER — Encounter: Payer: Self-pay | Admitting: Internal Medicine

## 2024-01-17 ENCOUNTER — Ambulatory Visit: Attending: Internal Medicine | Admitting: Internal Medicine

## 2024-01-17 VITALS — BP 112/60 | HR 56 | Ht 64.0 in | Wt 220.0 lb

## 2024-01-17 DIAGNOSIS — I2510 Atherosclerotic heart disease of native coronary artery without angina pectoris: Secondary | ICD-10-CM | POA: Diagnosis not present

## 2024-01-17 DIAGNOSIS — I441 Atrioventricular block, second degree: Secondary | ICD-10-CM | POA: Insufficient documentation

## 2024-01-17 DIAGNOSIS — Z951 Presence of aortocoronary bypass graft: Secondary | ICD-10-CM | POA: Insufficient documentation

## 2024-01-17 DIAGNOSIS — N186 End stage renal disease: Secondary | ICD-10-CM | POA: Diagnosis not present

## 2024-01-17 DIAGNOSIS — I10 Essential (primary) hypertension: Secondary | ICD-10-CM | POA: Insufficient documentation

## 2024-01-17 DIAGNOSIS — I5020 Unspecified systolic (congestive) heart failure: Secondary | ICD-10-CM | POA: Insufficient documentation

## 2024-01-17 DIAGNOSIS — E785 Hyperlipidemia, unspecified: Secondary | ICD-10-CM | POA: Diagnosis not present

## 2024-01-17 NOTE — Patient Instructions (Signed)
 Medication Instructions:  Your physician recommends that you continue on your current medications as directed. Please refer to the Current Medication list given to you today.  *If you need a refill on your cardiac medications before your next appointment, please call your pharmacy*  Testing/Procedures: Your physician has requested that you have an echocardiogram. Echocardiography is a painless test that uses sound waves to create images of your heart. It provides your doctor with information about the size and shape of your heart and how well your heart's chambers and valves are working. This procedure takes approximately one hour. There are no restrictions for this procedure. Please do NOT wear cologne, perfume, aftershave, or lotions (deodorant is allowed). Please arrive 15 minutes prior to your appointment time.  Follow-Up: At [facility], you and your health needs are our priority.  As part of our continuing mission to provide you with exceptional heart care, our providers are all part of one team.  This team includes your primary Cardiologist (physician) and Advanced Practice Providers or APPs (Physician Assistants and Nurse Practitioners) who all work together to provide you with the care you need, when you need it.  Your next appointment:   1 year with Dr. Mona   We recommend signing up for the patient portal called MyChart.  Sign up information is provided on this After Visit Summary.  MyChart is used to connect with patients for Virtual Visits (Telemedicine).  Patients are able to view lab/test results, encounter notes, upcoming appointments, etc.  Non-urgent messages can be sent to your provider as well.   To learn more about what you can do with MyChart, go to [email or web address].
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
## 2024-01-18 DIAGNOSIS — D509 Iron deficiency anemia, unspecified: Secondary | ICD-10-CM | POA: Diagnosis not present

## 2024-01-18 DIAGNOSIS — N2581 Secondary hyperparathyroidism of renal origin: Secondary | ICD-10-CM | POA: Diagnosis not present

## 2024-01-18 DIAGNOSIS — N186 End stage renal disease: Secondary | ICD-10-CM | POA: Diagnosis not present

## 2024-01-18 DIAGNOSIS — D631 Anemia in chronic kidney disease: Secondary | ICD-10-CM | POA: Diagnosis not present

## 2024-01-18 DIAGNOSIS — N250 Renal osteodystrophy: Secondary | ICD-10-CM | POA: Diagnosis not present

## 2024-01-18 DIAGNOSIS — Z992 Dependence on renal dialysis: Secondary | ICD-10-CM | POA: Diagnosis not present

## 2024-01-21 DIAGNOSIS — N2581 Secondary hyperparathyroidism of renal origin: Secondary | ICD-10-CM | POA: Diagnosis not present

## 2024-01-21 DIAGNOSIS — N186 End stage renal disease: Secondary | ICD-10-CM | POA: Diagnosis not present

## 2024-01-21 DIAGNOSIS — D509 Iron deficiency anemia, unspecified: Secondary | ICD-10-CM | POA: Diagnosis not present

## 2024-01-21 DIAGNOSIS — Z992 Dependence on renal dialysis: Secondary | ICD-10-CM | POA: Diagnosis not present

## 2024-01-21 DIAGNOSIS — N250 Renal osteodystrophy: Secondary | ICD-10-CM | POA: Diagnosis not present

## 2024-01-21 DIAGNOSIS — D631 Anemia in chronic kidney disease: Secondary | ICD-10-CM | POA: Diagnosis not present

## 2024-01-23 DIAGNOSIS — N250 Renal osteodystrophy: Secondary | ICD-10-CM | POA: Diagnosis not present

## 2024-01-23 DIAGNOSIS — Z992 Dependence on renal dialysis: Secondary | ICD-10-CM | POA: Diagnosis not present

## 2024-01-23 DIAGNOSIS — D631 Anemia in chronic kidney disease: Secondary | ICD-10-CM | POA: Diagnosis not present

## 2024-01-23 DIAGNOSIS — N2581 Secondary hyperparathyroidism of renal origin: Secondary | ICD-10-CM | POA: Diagnosis not present

## 2024-01-23 DIAGNOSIS — N186 End stage renal disease: Secondary | ICD-10-CM | POA: Diagnosis not present

## 2024-01-23 DIAGNOSIS — D509 Iron deficiency anemia, unspecified: Secondary | ICD-10-CM | POA: Diagnosis not present

## 2024-01-25 DIAGNOSIS — Z992 Dependence on renal dialysis: Secondary | ICD-10-CM | POA: Diagnosis not present

## 2024-01-25 DIAGNOSIS — N250 Renal osteodystrophy: Secondary | ICD-10-CM | POA: Diagnosis not present

## 2024-01-25 DIAGNOSIS — N2581 Secondary hyperparathyroidism of renal origin: Secondary | ICD-10-CM | POA: Diagnosis not present

## 2024-01-25 DIAGNOSIS — D631 Anemia in chronic kidney disease: Secondary | ICD-10-CM | POA: Diagnosis not present

## 2024-01-25 DIAGNOSIS — N186 End stage renal disease: Secondary | ICD-10-CM | POA: Diagnosis not present

## 2024-01-25 DIAGNOSIS — D509 Iron deficiency anemia, unspecified: Secondary | ICD-10-CM | POA: Diagnosis not present

## 2024-01-28 DIAGNOSIS — N250 Renal osteodystrophy: Secondary | ICD-10-CM | POA: Diagnosis not present

## 2024-01-28 DIAGNOSIS — Z992 Dependence on renal dialysis: Secondary | ICD-10-CM | POA: Diagnosis not present

## 2024-01-28 DIAGNOSIS — D509 Iron deficiency anemia, unspecified: Secondary | ICD-10-CM | POA: Diagnosis not present

## 2024-01-28 DIAGNOSIS — N186 End stage renal disease: Secondary | ICD-10-CM | POA: Diagnosis not present

## 2024-01-28 DIAGNOSIS — N2581 Secondary hyperparathyroidism of renal origin: Secondary | ICD-10-CM | POA: Diagnosis not present

## 2024-01-28 DIAGNOSIS — D631 Anemia in chronic kidney disease: Secondary | ICD-10-CM | POA: Diagnosis not present

## 2024-01-29 ENCOUNTER — Ambulatory Visit: Admitting: Nurse Practitioner

## 2024-01-30 DIAGNOSIS — N2581 Secondary hyperparathyroidism of renal origin: Secondary | ICD-10-CM | POA: Diagnosis not present

## 2024-01-30 DIAGNOSIS — D509 Iron deficiency anemia, unspecified: Secondary | ICD-10-CM | POA: Diagnosis not present

## 2024-01-30 DIAGNOSIS — Z992 Dependence on renal dialysis: Secondary | ICD-10-CM | POA: Diagnosis not present

## 2024-01-30 DIAGNOSIS — N186 End stage renal disease: Secondary | ICD-10-CM | POA: Diagnosis not present

## 2024-01-30 DIAGNOSIS — N250 Renal osteodystrophy: Secondary | ICD-10-CM | POA: Diagnosis not present

## 2024-01-30 DIAGNOSIS — D631 Anemia in chronic kidney disease: Secondary | ICD-10-CM | POA: Diagnosis not present

## 2024-02-01 DIAGNOSIS — D509 Iron deficiency anemia, unspecified: Secondary | ICD-10-CM | POA: Diagnosis not present

## 2024-02-01 DIAGNOSIS — D631 Anemia in chronic kidney disease: Secondary | ICD-10-CM | POA: Diagnosis not present

## 2024-02-01 DIAGNOSIS — Z992 Dependence on renal dialysis: Secondary | ICD-10-CM | POA: Diagnosis not present

## 2024-02-01 DIAGNOSIS — N186 End stage renal disease: Secondary | ICD-10-CM | POA: Diagnosis not present

## 2024-02-01 DIAGNOSIS — N2581 Secondary hyperparathyroidism of renal origin: Secondary | ICD-10-CM | POA: Diagnosis not present

## 2024-02-01 DIAGNOSIS — N250 Renal osteodystrophy: Secondary | ICD-10-CM | POA: Diagnosis not present

## 2024-02-04 DIAGNOSIS — D509 Iron deficiency anemia, unspecified: Secondary | ICD-10-CM | POA: Diagnosis not present

## 2024-02-04 DIAGNOSIS — N250 Renal osteodystrophy: Secondary | ICD-10-CM | POA: Diagnosis not present

## 2024-02-04 DIAGNOSIS — N2581 Secondary hyperparathyroidism of renal origin: Secondary | ICD-10-CM | POA: Diagnosis not present

## 2024-02-04 DIAGNOSIS — Z992 Dependence on renal dialysis: Secondary | ICD-10-CM | POA: Diagnosis not present

## 2024-02-04 DIAGNOSIS — D631 Anemia in chronic kidney disease: Secondary | ICD-10-CM | POA: Diagnosis not present

## 2024-02-04 DIAGNOSIS — N186 End stage renal disease: Secondary | ICD-10-CM | POA: Diagnosis not present

## 2024-02-06 DIAGNOSIS — D509 Iron deficiency anemia, unspecified: Secondary | ICD-10-CM | POA: Diagnosis not present

## 2024-02-06 DIAGNOSIS — N250 Renal osteodystrophy: Secondary | ICD-10-CM | POA: Diagnosis not present

## 2024-02-06 DIAGNOSIS — N186 End stage renal disease: Secondary | ICD-10-CM | POA: Diagnosis not present

## 2024-02-06 DIAGNOSIS — Z992 Dependence on renal dialysis: Secondary | ICD-10-CM | POA: Diagnosis not present

## 2024-02-06 DIAGNOSIS — N2581 Secondary hyperparathyroidism of renal origin: Secondary | ICD-10-CM | POA: Diagnosis not present

## 2024-02-06 DIAGNOSIS — D631 Anemia in chronic kidney disease: Secondary | ICD-10-CM | POA: Diagnosis not present

## 2024-02-07 ENCOUNTER — Ambulatory Visit (HOSPITAL_COMMUNITY)
Admission: RE | Admit: 2024-02-07 | Discharge: 2024-02-07 | Disposition: A | Discharge status: Home / Self Care | Source: Ambulatory Visit | Attending: Internal Medicine | Admitting: Internal Medicine

## 2024-02-07 ENCOUNTER — Ambulatory Visit: Payer: Self-pay | Admitting: Internal Medicine

## 2024-02-07 DIAGNOSIS — I350 Nonrheumatic aortic (valve) stenosis: Secondary | ICD-10-CM | POA: Diagnosis not present

## 2024-02-07 DIAGNOSIS — I5020 Unspecified systolic (congestive) heart failure: Secondary | ICD-10-CM

## 2024-02-07 DIAGNOSIS — I2510 Atherosclerotic heart disease of native coronary artery without angina pectoris: Secondary | ICD-10-CM | POA: Diagnosis not present

## 2024-02-07 LAB — ECHOCARDIOGRAM COMPLETE
AR max vel: 0.61 cm2
AV Area VTI: 0.59 cm2
AV Area mean vel: 0.59 cm2
AV Mean grad: 20 mmHg
AV Peak grad: 36.6 mmHg
Ao pk vel: 3.03 m/s
Area-P 1/2: 3.68 cm2
MV M vel: 4.74 m/s
MV Peak grad: 89.9 mmHg
S' Lateral: 3.6 cm

## 2024-02-07 NOTE — Progress Notes (Signed)
*  PRELIMINARY RESULTS* Echocardiogram 2D Echocardiogram has been performed.  [name] [name] 02/07/2024, 12:16 PM

## 2024-02-08 DIAGNOSIS — Z992 Dependence on renal dialysis: Secondary | ICD-10-CM | POA: Diagnosis not present

## 2024-02-08 DIAGNOSIS — N2581 Secondary hyperparathyroidism of renal origin: Secondary | ICD-10-CM | POA: Diagnosis not present

## 2024-02-08 DIAGNOSIS — D509 Iron deficiency anemia, unspecified: Secondary | ICD-10-CM | POA: Diagnosis not present

## 2024-02-08 DIAGNOSIS — N186 End stage renal disease: Secondary | ICD-10-CM | POA: Diagnosis not present

## 2024-02-08 DIAGNOSIS — D631 Anemia in chronic kidney disease: Secondary | ICD-10-CM | POA: Diagnosis not present

## 2024-02-08 DIAGNOSIS — N250 Renal osteodystrophy: Secondary | ICD-10-CM | POA: Diagnosis not present

## 2024-02-11 DIAGNOSIS — N186 End stage renal disease: Secondary | ICD-10-CM | POA: Diagnosis not present

## 2024-02-11 DIAGNOSIS — N2581 Secondary hyperparathyroidism of renal origin: Secondary | ICD-10-CM | POA: Diagnosis not present

## 2024-02-11 DIAGNOSIS — D631 Anemia in chronic kidney disease: Secondary | ICD-10-CM | POA: Diagnosis not present

## 2024-02-11 DIAGNOSIS — D509 Iron deficiency anemia, unspecified: Secondary | ICD-10-CM | POA: Diagnosis not present

## 2024-02-11 DIAGNOSIS — Z992 Dependence on renal dialysis: Secondary | ICD-10-CM | POA: Diagnosis not present

## 2024-02-11 DIAGNOSIS — N250 Renal osteodystrophy: Secondary | ICD-10-CM | POA: Diagnosis not present

## 2024-02-13 DIAGNOSIS — D631 Anemia in chronic kidney disease: Secondary | ICD-10-CM | POA: Diagnosis not present

## 2024-02-13 DIAGNOSIS — N186 End stage renal disease: Secondary | ICD-10-CM | POA: Diagnosis not present

## 2024-02-13 DIAGNOSIS — D509 Iron deficiency anemia, unspecified: Secondary | ICD-10-CM | POA: Diagnosis not present

## 2024-02-13 DIAGNOSIS — N250 Renal osteodystrophy: Secondary | ICD-10-CM | POA: Diagnosis not present

## 2024-02-13 DIAGNOSIS — Z992 Dependence on renal dialysis: Secondary | ICD-10-CM | POA: Diagnosis not present

## 2024-02-13 DIAGNOSIS — N2581 Secondary hyperparathyroidism of renal origin: Secondary | ICD-10-CM | POA: Diagnosis not present

## 2024-02-14 ENCOUNTER — Encounter: Payer: Self-pay | Admitting: Cardiology

## 2024-02-14 ENCOUNTER — Encounter: Payer: Self-pay | Admitting: Gastroenterology

## 2024-02-14 ENCOUNTER — Ambulatory Visit: Attending: Cardiology | Admitting: Cardiology

## 2024-02-14 VITALS — BP 122/71 | HR 51 | Ht 64.0 in | Wt 215.0 lb

## 2024-02-14 DIAGNOSIS — I2510 Atherosclerotic heart disease of native coronary artery without angina pectoris: Secondary | ICD-10-CM | POA: Diagnosis not present

## 2024-02-14 DIAGNOSIS — I441 Atrioventricular block, second degree: Secondary | ICD-10-CM | POA: Insufficient documentation

## 2024-02-14 DIAGNOSIS — Z992 Dependence on renal dialysis: Secondary | ICD-10-CM | POA: Diagnosis not present

## 2024-02-14 DIAGNOSIS — N186 End stage renal disease: Secondary | ICD-10-CM | POA: Diagnosis not present

## 2024-02-14 NOTE — Progress Notes (Signed)
  Electrophysiology Office Note:   Date:  02/14/2024  ID:  [name], [name] 05-26-1951, MRN [id number]  Primary Cardiologist: Vinie JAYSON Maxcy, MD Primary Heart Failure: None Electrophysiologist: Furqan Gosselin Gladis Norton, MD      History of Present Illness:   [name] [name] is a 73 y.o. male with h/o diabetes, hypertension, hyperlipidemia, coronary artery disease post RCA stent, end-stage renal disease, sleep apnea seen today for routine electrophysiology followup.   He was admitted to the hospital in November 2023 for CABG.  His hospital course was complicated by bradycardia with first and second-degree AV block.  He continued to be asymptomatic during hospital stay and thus no further intervention was done.  Since last being seen in our clinic the patient reports doing well.  He has no chest pain or shortness of breath.  He is able to do all of his daily activities without restriction.  He gets tired on days that he is in dialysis, but otherwise has no acute complaints.  He does not note any bradycardia, syncope, near syncope.  He is not told during dialysis that his heart rate is slow.  he denies chest pain, palpitations, dyspnea, PND, orthopnea, nausea, vomiting, dizziness, syncope, edema, weight gain, or early satiety.   Review of systems complete and found to be negative unless listed in HPI.   EP Information / Studies Reviewed:    EKG is not ordered today. EKG from 01/17/2024 reviewed which showed sinus rhythm, Mobitz 1 AV block        Risk Assessment/Calculations:            Physical Exam:   VS:  There were no vitals taken for this visit.   Wt Readings from Last 3 Encounters:  01/17/24 220 lb (99.8 kg)  11/19/23 216 lb (98 kg)  11/13/23 216 lb (98 kg)     GEN: Well nourished, well developed in no acute distress NECK: No JVD; No carotid bruits CARDIAC: Irregularly irregular rate and rhythm, no murmurs, rubs, gallops RESPIRATORY:  Clear to auscultation without rales, wheezing  or rhonchi  ABDOMEN: Soft, non-tender, non-distended EXTREMITIES:  No edema; No deformity   ASSESSMENT AND PLAN:    1.  Second-degree AV block: Has both Mobitz 1 and 2-1 block and a nocturnal complete heart block.  At this point, no indication for pacing.  If he does need pacemaker implant, he would be a dual-chamber leadless candidate.  For now, he continues to have a stable rhythm.  Jerita Wimbush continue monitoring.  2.  Coronary disease: Post CABG.  Plan per primary cardiology  3.  End-stage renal disease: Does hemodialysis.  Follow up with EP APP in 12 months  Signed, Jihan Rudy Gladis Norton, MD

## 2024-02-15 DIAGNOSIS — D509 Iron deficiency anemia, unspecified: Secondary | ICD-10-CM | POA: Diagnosis not present

## 2024-02-15 DIAGNOSIS — Z23 Encounter for immunization: Secondary | ICD-10-CM | POA: Diagnosis not present

## 2024-02-15 DIAGNOSIS — N186 End stage renal disease: Secondary | ICD-10-CM | POA: Diagnosis not present

## 2024-02-15 DIAGNOSIS — N2581 Secondary hyperparathyroidism of renal origin: Secondary | ICD-10-CM | POA: Diagnosis not present

## 2024-02-15 DIAGNOSIS — D631 Anemia in chronic kidney disease: Secondary | ICD-10-CM | POA: Diagnosis not present

## 2024-02-15 DIAGNOSIS — N250 Renal osteodystrophy: Secondary | ICD-10-CM | POA: Diagnosis not present

## 2024-02-15 DIAGNOSIS — Z992 Dependence on renal dialysis: Secondary | ICD-10-CM | POA: Diagnosis not present

## 2024-02-18 DIAGNOSIS — D509 Iron deficiency anemia, unspecified: Secondary | ICD-10-CM | POA: Diagnosis not present

## 2024-02-18 DIAGNOSIS — D631 Anemia in chronic kidney disease: Secondary | ICD-10-CM | POA: Diagnosis not present

## 2024-02-18 DIAGNOSIS — Z992 Dependence on renal dialysis: Secondary | ICD-10-CM | POA: Diagnosis not present

## 2024-02-18 DIAGNOSIS — Z23 Encounter for immunization: Secondary | ICD-10-CM | POA: Diagnosis not present

## 2024-02-18 DIAGNOSIS — N2581 Secondary hyperparathyroidism of renal origin: Secondary | ICD-10-CM | POA: Diagnosis not present

## 2024-02-18 DIAGNOSIS — N186 End stage renal disease: Secondary | ICD-10-CM | POA: Diagnosis not present

## 2024-02-20 DIAGNOSIS — N186 End stage renal disease: Secondary | ICD-10-CM | POA: Diagnosis not present

## 2024-02-20 DIAGNOSIS — Z992 Dependence on renal dialysis: Secondary | ICD-10-CM | POA: Diagnosis not present

## 2024-02-20 DIAGNOSIS — N2581 Secondary hyperparathyroidism of renal origin: Secondary | ICD-10-CM | POA: Diagnosis not present

## 2024-02-20 DIAGNOSIS — D509 Iron deficiency anemia, unspecified: Secondary | ICD-10-CM | POA: Diagnosis not present

## 2024-02-20 DIAGNOSIS — D631 Anemia in chronic kidney disease: Secondary | ICD-10-CM | POA: Diagnosis not present

## 2024-02-20 DIAGNOSIS — Z23 Encounter for immunization: Secondary | ICD-10-CM | POA: Diagnosis not present

## 2024-02-22 DIAGNOSIS — D509 Iron deficiency anemia, unspecified: Secondary | ICD-10-CM | POA: Diagnosis not present

## 2024-02-22 DIAGNOSIS — N2581 Secondary hyperparathyroidism of renal origin: Secondary | ICD-10-CM | POA: Diagnosis not present

## 2024-02-22 DIAGNOSIS — Z23 Encounter for immunization: Secondary | ICD-10-CM | POA: Diagnosis not present

## 2024-02-22 DIAGNOSIS — N186 End stage renal disease: Secondary | ICD-10-CM | POA: Diagnosis not present

## 2024-02-22 DIAGNOSIS — D631 Anemia in chronic kidney disease: Secondary | ICD-10-CM | POA: Diagnosis not present

## 2024-02-22 DIAGNOSIS — Z992 Dependence on renal dialysis: Secondary | ICD-10-CM | POA: Diagnosis not present

## 2024-02-25 DIAGNOSIS — Z992 Dependence on renal dialysis: Secondary | ICD-10-CM | POA: Diagnosis not present

## 2024-02-25 DIAGNOSIS — D631 Anemia in chronic kidney disease: Secondary | ICD-10-CM | POA: Diagnosis not present

## 2024-02-25 DIAGNOSIS — Z23 Encounter for immunization: Secondary | ICD-10-CM | POA: Diagnosis not present

## 2024-02-25 DIAGNOSIS — N2581 Secondary hyperparathyroidism of renal origin: Secondary | ICD-10-CM | POA: Diagnosis not present

## 2024-02-25 DIAGNOSIS — D509 Iron deficiency anemia, unspecified: Secondary | ICD-10-CM | POA: Diagnosis not present

## 2024-02-25 DIAGNOSIS — N186 End stage renal disease: Secondary | ICD-10-CM | POA: Diagnosis not present

## 2024-02-27 DIAGNOSIS — D509 Iron deficiency anemia, unspecified: Secondary | ICD-10-CM | POA: Diagnosis not present

## 2024-02-27 DIAGNOSIS — N2581 Secondary hyperparathyroidism of renal origin: Secondary | ICD-10-CM | POA: Diagnosis not present

## 2024-02-27 DIAGNOSIS — N186 End stage renal disease: Secondary | ICD-10-CM | POA: Diagnosis not present

## 2024-02-27 DIAGNOSIS — Z23 Encounter for immunization: Secondary | ICD-10-CM | POA: Diagnosis not present

## 2024-02-27 DIAGNOSIS — Z992 Dependence on renal dialysis: Secondary | ICD-10-CM | POA: Diagnosis not present

## 2024-02-27 DIAGNOSIS — D631 Anemia in chronic kidney disease: Secondary | ICD-10-CM | POA: Diagnosis not present

## 2024-02-29 DIAGNOSIS — D509 Iron deficiency anemia, unspecified: Secondary | ICD-10-CM | POA: Diagnosis not present

## 2024-02-29 DIAGNOSIS — D631 Anemia in chronic kidney disease: Secondary | ICD-10-CM | POA: Diagnosis not present

## 2024-02-29 DIAGNOSIS — N2581 Secondary hyperparathyroidism of renal origin: Secondary | ICD-10-CM | POA: Diagnosis not present

## 2024-02-29 DIAGNOSIS — N186 End stage renal disease: Secondary | ICD-10-CM | POA: Diagnosis not present

## 2024-02-29 DIAGNOSIS — Z992 Dependence on renal dialysis: Secondary | ICD-10-CM | POA: Diagnosis not present

## 2024-02-29 DIAGNOSIS — Z23 Encounter for immunization: Secondary | ICD-10-CM | POA: Diagnosis not present

## 2024-03-03 DIAGNOSIS — Z992 Dependence on renal dialysis: Secondary | ICD-10-CM | POA: Diagnosis not present

## 2024-03-03 DIAGNOSIS — D631 Anemia in chronic kidney disease: Secondary | ICD-10-CM | POA: Diagnosis not present

## 2024-03-03 DIAGNOSIS — N2581 Secondary hyperparathyroidism of renal origin: Secondary | ICD-10-CM | POA: Diagnosis not present

## 2024-03-03 DIAGNOSIS — N186 End stage renal disease: Secondary | ICD-10-CM | POA: Diagnosis not present

## 2024-03-03 DIAGNOSIS — D509 Iron deficiency anemia, unspecified: Secondary | ICD-10-CM | POA: Diagnosis not present

## 2024-03-03 DIAGNOSIS — Z23 Encounter for immunization: Secondary | ICD-10-CM | POA: Diagnosis not present

## 2024-03-03 NOTE — Progress Notes (Unsigned)
 [facility] Consult Note  No chief complaint on file.  History of Present Illness: 73 yo male with history of anemia, CAD, ESRD on HD, DM, COPD, HTN, HLD, sleep apnea  He tells me today that he *** He lives in *** *** is retired Engineer, manufacturing ***  Primary Care Physician: [name], Tesfaye Demissie, MD Primary Cardiologist: *** Referring Cardiologist: ***  Past Medical History:  Diagnosis Date   Anemia    Arthritis    Asthma    Bell palsy    left side of face- comes and goes per pt on 11/12/23   CAD (coronary artery disease)    a. 2014 MV: abnl w/ infap ischemia; b. 03/2013 Cath: aneurysmal bleb in the LAD w/ otw nonobs dzs-->Med Rx.   Chronic back pain    Chronic kidney disease    on dialysis - mon wed fri   Chronic knee pain    a. 09/2015 s/p R TKA.   Chronic pain    Chronic shoulder pain    Chronic sinusitis    COPD (chronic obstructive pulmonary disease) (HCC)    Diabetes mellitus without complication (HCC)    type II , no meds, does not check blood sugar   Dyspnea    with exertion   Dysrhythmia    2nd degree AV block   ESRD on peritoneal dialysis (HCC)    on peritoneal dialysis, [facility] - mon wed fri   Essential hypertension    GERD (gastroesophageal reflux disease)    Gout    Hepatomegaly    noted on noncontrast CT 2015   History of hiatal hernia    Hyperlipidemia    Hypothyroidism    Lateral meniscus tear    Myocardial infarction (HCC) 07/2021   Neuromuscular disorder (HCC)    Obesity    Truncal   Obstructive sleep apnea    does not use cpap    On home oxygen  therapy    uses 2L when needed   Pneumonia    PUD (peptic ulcer disease)    remote, reports f/u EGD about 8 years ago unremarkable    Reactive airway disease    related to exposure to chemical during 9/11   Sinusitis    Substance abuse (HCC)    hx in past   Vitamin D  deficiency     Past Surgical History:  Procedure Laterality Date   A/V FISTULAGRAM N/A 02/28/2023    Procedure: A/V Fistulagram;  Surgeon: Lanis Fonda BRAVO, MD;  Location: [facility] INVASIVE CV LAB;  Service: Cardiovascular;  Laterality: N/A;   A/V FISTULAGRAM N/A 10/22/2023   Procedure: A/V Fistulagram;  Surgeon: Sheree Penne Bruckner, MD;  Location: [facility] INVASIVE CV LAB;  Service: Cardiovascular;  Laterality: N/A;   ASAD LT SHOULDER  12/15/2008   left shoulder   AV FISTULA PLACEMENT Left 08/09/2016   Procedure: BRACHIOCEPHALIC ARTERIOVENOUS (AV) FISTULA CREATION LEFT ARM;  Surgeon: Carlin BRAVO Haddock, MD;  Location: [facility] OR;  Service: Vascular;  Laterality: Left;   BASCILIC VEIN TRANSPOSITION Left 11/13/2023   Procedure: TRANSPOSITION, VEIN, BASILIC;  Surgeon: Sheree Penne Bruckner, MD;  Location: [facility] OR;  Service: Vascular;  Laterality: Left;   CAPD INSERTION N/A 10/07/2018   Procedure: LAPAROSCOPIC PERITONEAL CATHETER PLACEMENT;  Surgeon: Stevie Herlene Righter, MD;  Location: [facility] ORS;  Service: General;  Laterality: N/A;   CATARACT EXTRACTION W/PHACO Left 03/28/2016   Procedure: CATARACT EXTRACTION PHACO AND INTRAOCULAR LENS PLACEMENT LEFT EYE;  Surgeon: Oneil Platts, MD;  Location: [facility] ORS;  Service:  Ophthalmology;  Laterality: Left;  CDE: 4.77   CATARACT EXTRACTION W/PHACO Right 04/11/2016   Procedure: CATARACT EXTRACTION PHACO AND INTRAOCULAR LENS PLACEMENT RIGHT EYE; CDE:  4.74;  Surgeon: Oneil Platts, MD;  Location: [facility] ORS;  Service: Ophthalmology;  Laterality: Right;   COLONOSCOPY  10/15/2008   Fields: Rectal polyp obliterated, not retrieved, hemorrhoids, single ascending colon diverticulum near the CV. Next colonoscopy April 2020   COLONOSCOPY N/A 12/25/2014   SLF: 1. Colorectal polyps (2) removed 2. Small internal hemorrhoids 3. the left colon is severely redundant. hyperplastic polyps   CORONARY ARTERY BYPASS GRAFT N/A 06/05/2022   Procedure: OFF PUMP CORONARY ARTERY BYPASS GRAFTING (CABG) X 2 BYPASSES USING LEFT INTERNAL MAMMARY ARTERY AND RIGHT LEG GREATER SAPHENOUS VEIN HARVESTED  ENDOSCOPICALLY;  Surgeon: Shyrl Linnie KIDD, MD;  Location: [facility] OR;  Service: Open Heart Surgery;  Laterality: N/A;   CORONARY STENT INTERVENTION N/A 07/25/2021   Procedure: CORONARY STENT INTERVENTION;  Surgeon: Wonda Sharper, MD;  Location: [facility] INVASIVE CV LAB;  Service: Cardiovascular;  Laterality: N/A;   CORONARY STENT INTERVENTION N/A 12/26/2021   Procedure: CORONARY STENT INTERVENTION;  Surgeon: Mady Bruckner, MD;  Location: [facility] INVASIVE CV LAB;  Service: Cardiovascular;  Laterality: N/A;   CORONARY STENT INTERVENTION N/A 01/20/2022   Procedure: CORONARY STENT INTERVENTION;  Surgeon: Wonda Sharper, MD;  Location: [facility] INVASIVE CV LAB;  Service: Cardiovascular;  Laterality: N/A;   DOPPLER ECHOCARDIOGRAPHY     ESOPHAGOGASTRODUODENOSCOPY N/A 12/25/2014   SLF: 1. Anemia most likely due to CRI, gastritis, gastric polyps 2. Moderate non-erosive gastriits and mild duodenitis.  3.TWo large gstric polyps removed.    EYE SURGERY  12/22/2010   tear duct probing-[place]   FOREIGN BODY REMOVAL  03/29/2011   Procedure: REMOVAL FOREIGN BODY EXTREMITY;  Surgeon: Taft Minerva, MD;  Location: [facility]S;  Service: Orthopedics;  Laterality: Right;  Removal Foreign Body Right Thumb   INSERTION OF DIALYSIS CATHETER N/A 11/13/2023   Procedure: INSERTION OF DIALYSIS CATHETER;  Surgeon: Sheree Penne Bruckner, MD;  Location: [facility] OR;  Service: Vascular;  Laterality: N/A;   IR FLUORO GUIDE CV LINE RIGHT  08/06/2018   IR FLUORO GUIDE CV LINE RIGHT  02/17/2023   IR US  GUIDE VASC ACCESS RIGHT  08/06/2018   IR US  GUIDE VASC ACCESS RIGHT  02/17/2023   KNEE ARTHROSCOPY  10/16/2007   left   KNEE ARTHROSCOPY WITH LATERAL MENISECTOMY Right 10/14/2015   Procedure: LEFT KNEE ARTHROSCOPY WITH PARTIAL LATERAL MENISECTOMY;  Surgeon: Taft FORBES Minerva, MD;  Location: [facility];  Service: Orthopedics;  Laterality: Right;   LEFT HEART CATH AND CORONARY ANGIOGRAPHY N/A 07/25/2021   Procedure: LEFT HEART CATH AND CORONARY  ANGIOGRAPHY;  Surgeon: Wonda Sharper, MD;  Location: [facility] INVASIVE CV LAB;  Service: Cardiovascular;  Laterality: N/A;   LEFT HEART CATH AND CORONARY ANGIOGRAPHY N/A 12/26/2021   Procedure: LEFT HEART CATH AND CORONARY ANGIOGRAPHY;  Surgeon: Mady Bruckner, MD;  Location: [facility] INVASIVE CV LAB;  Service: Cardiovascular;  Laterality: N/A;   LEFT HEART CATH AND CORONARY ANGIOGRAPHY N/A 01/20/2022   Procedure: LEFT HEART CATH AND CORONARY ANGIOGRAPHY;  Surgeon: Wonda Sharper, MD;  Location: [facility] INVASIVE CV LAB;  Service: Cardiovascular;  Laterality: N/A;   LEFT HEART CATHETERIZATION WITH CORONARY ANGIOGRAM N/A 03/28/2013   Procedure: LEFT HEART CATHETERIZATION WITH CORONARY ANGIOGRAM;  Surgeon: Alm LELON Clay, MD;  Location: [facility] CATH LAB;  Service: Cardiovascular;  Laterality: N/A;   NM MYOVIEW  LTD     PENILE PROSTHESIS IMPLANT N/A 08/16/2015   Procedure: PENILE  PROTHESIS INFLATABLE, three piece, Excisional biopsy of Penile ulcer, Penile molding;  Surgeon: Arlena Gal, MD;  Location: [facility] ORS;  Service: Urology;  Laterality: N/A;   PENILE PROSTHESIS IMPLANT N/A 12/24/2017   Procedure: REMOVAL AND  REPLACEMENT  COLOPLAST PENILE PROSTHESIS;  Surgeon: Carolee Sherwood JONETTA DOUGLAS, MD;  Location: [facility] ORS;  Service: Urology;  Laterality: N/A;   PERIPHERAL VASCULAR BALLOON ANGIOPLASTY  02/28/2023   Procedure: PERIPHERAL VASCULAR BALLOON ANGIOPLASTY;  Surgeon: Lanis Fonda BRAVO, MD;  Location: [facility] INVASIVE CV LAB;  Service: Cardiovascular;;   PORT-A-CATH REMOVAL N/A 02/19/2023   Procedure: REMOVAL OF PERITONEAL DIALYSIS CATHETER;  Surgeon: Lanis Fonda BRAVO, MD;  Location: [facility] OR;  Service: Vascular;  Laterality: N/A;   QUADRICEPS TENDON REPAIR  07/21/2011   Procedure: REPAIR QUADRICEP TENDON;  Surgeon: Taft Minerva, MD;  Location: [facility];  Service: Orthopedics;  Laterality: Right;   REVISON OF ARTERIOVENOUS FISTULA Left 11/13/2023   Procedure: REVISON OF ARTERIOVENOUS FISTULA;  Surgeon: Sheree Penne Bruckner,  MD;  Location: [facility] OR;  Service: Vascular;  Laterality: Left;   RIGHT/LEFT HEART CATH AND CORONARY ANGIOGRAPHY N/A 05/30/2022   Procedure: RIGHT/LEFT HEART CATH AND CORONARY ANGIOGRAPHY;  Surgeon: Dann Candyce RAMAN, MD;  Location: [facility] INVASIVE CV LAB;  Service: Cardiovascular;  Laterality: N/A;   TEE WITHOUT CARDIOVERSION N/A 06/05/2022   Procedure: TRANSESOPHAGEAL ECHOCARDIOGRAM (TEE);  Surgeon: Shyrl Linnie KIDD, MD;  Location: [facility] OR;  Service: Open Heart Surgery;  Laterality: N/A;   TOENAIL EXCISION     removed x2-bilateral   UMBILICAL HERNIA REPAIR  07/17/2005   [place]    Current Outpatient Medications  Medication Sig Dispense Refill   acetaminophen  (TYLENOL ) 650 MG CR tablet Take 1,300 mg by mouth every 8 (eight) hours as needed for pain. (Patient not taking: Reported on 02/14/2024)     albuterol  (VENTOLIN  HFA) 108 (90 Base) MCG/ACT inhaler Inhale 2 puffs into the lungs every 6 (six) hours as needed for wheezing or shortness of breath. (Patient not taking: Reported on 02/14/2024) 18 g 11   amLODipine  (NORVASC ) 5 MG tablet Take 1 tablet (5 mg total) by mouth daily. 30 tablet 5   ascorbic acid  (VITAMIN C ) 500 MG tablet Take 1 tablet (500 mg total) by mouth daily. 100 tablet 0   aspirin  EC 81 MG tablet Take 81 mg by mouth daily. Swallow whole.     atorvastatin  (LIPITOR ) 80 MG tablet Take 1 tablet (80 mg total) by mouth daily. 30 tablet 1   esomeprazole  (NEXIUM ) 40 MG capsule Take 1 capsule (40 mg total) by mouth 2 (two) times daily before a meal. 180 capsule 0   ferric citrate (AURYXIA) 1 GM 210 MG(Fe) tablet Take 420 mg by mouth 3 (three) times daily with meals.     Fluticasone -Umeclidin-Vilant (TRELEGY ELLIPTA ) 200-62.5-25 MCG/ACT AEPB Inhale 1 puff into the lungs daily. 28 each 5   furosemide  (LASIX ) 80 MG tablet Take 80 mg by mouth 4 (four) times a week. Tues, Thurs, Sat, Sun (non-dialysis days) (Patient not taking: Reported on 02/14/2024)     HYDROcodone -acetaminophen  (NORCO/VICODIN)  5-325 MG tablet Take 1 tablet by mouth every 6 (six) hours as needed for moderate pain (pain score 4-6). (Patient not taking: Reported on 02/14/2024) 15 tablet 0   loratadine  (CLARITIN ) 10 MG tablet Take 10 mg by mouth daily.     midodrine  (PROAMATINE ) 10 MG tablet Take 10 mg by mouth 3 (three) times a week. Mon, Wed, and Friday     OVER THE COUNTER MEDICATION Take 2 tablets  by mouth daily at 6 (six) AM.     tamsulosin (FLOMAX) 0.4 MG CAPS capsule Take 0.4 mg by mouth daily after supper.     No current facility-administered medications for this visit.    Allergies  Allergen Reactions   Tramadol  Itching   Opana [Oxymorphone Hcl] Itching    Social History   Socioeconomic History   Marital status: Married    Spouse name: Not on file   Number of children: 2   Years of education: 12th grade   Highest education level: Not on file  Occupational History   Occupation: disabled   Occupation:      Associate Professor: UNEMPLOYED  Tobacco Use   Smoking status: Former    Current packs/day: 0.00    Average packs/day: 1 pack/day for 25.0 years (25.0 ttl pk-yrs)    Types: Cigarettes    Start date: 03/27/1985    Quit date: 03/27/2010    Years since quitting: 13.9   Smokeless tobacco: Never   Tobacco comments:    Quit in 2011  Vaping Use   Vaping status: Never Used  Substance and Sexual Activity   Alcohol  use: Yes    Comment: occasionally liquor   Drug use: Yes    Types: Marijuana    Comment: cocaine- last time used- 11/24/2017 , marijuana- last use 11/10/23 withhold 24 hrs prior to procedure   Sexual activity: Yes    Birth control/protection: None  Other Topics Concern   Not on file  Social History Narrative   He quit smoking in 2010. 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 He developed pulmonary problems, became disabled because of lower airway disease in 2009.       WATCHES BASKETBALL. HIS TEAM IS [place].   Social Drivers of Research scientist (physical sciences) Strain: Low Risk  (03/05/2023)   Received from [facility]   Overall Financial Resource Strain (CARDIA)    Difficulty of Paying Living Expenses: Not hard at all  Food Insecurity: No Food Insecurity (08/25/2023)   Hunger Vital Sign    Worried About Running Out of Food in the Last Year: Never true    Ran Out of Food in the Last Year: Never true  Transportation Needs: No Transportation Needs (08/25/2023)   PRAPARE - Administrator, Civil Service (Medical): No    Lack of Transportation (Non-Medical): No  Physical Activity: Sufficiently Active (12/29/2021)   Exercise Vital Sign    Days of Exercise per Week: 3 days    Minutes of Exercise per Session: 60 min  Stress: Patient Declined (03/12/2023)   Received from [facility]   [facility] of Occupational Health - Occupational Stress Questionnaire    Feeling of Stress : Patient declined  Social Connections: Socially Integrated (08/25/2023)   Social Connection and Isolation Panel    Frequency of Communication with Friends and Family: More than three times a week    Frequency of Social Gatherings with Friends and Family: More than three times a week    Attends Religious Services: More than 4 times per year    Active Member of [employer] or Organizations: Yes    Attends Banker Meetings: More than 4 times per year    Marital Status: Married  Catering manager Violence: Not At Risk (08/25/2023)   Humiliation, Afraid, Rape, and Kick questionnaire    Fear of Current or Ex-Partner: No    Emotionally Abused: No    Physically Abused: No  Sexually Abused: No    Family History  Problem Relation Age of Onset   Hypertension Mother        MI   Cancer Mother        breast    Diabetes Mother    Diabetes Father    Hypertension Father    Hypertension Sister    Diabetes Sister    Arthritis Other    Asthma Other    Lung disease Other    Anesthesia problems Neg Hx    Hypotension Neg Hx    Malignant hyperthermia Neg  Hx    Pseudochol deficiency Neg Hx    Colon cancer Neg Hx     Review of Systems:  As stated in the HPI and otherwise negative.   There were no vitals taken for this visit.  Physical Examination: General: Well developed, well nourished, NAD  HEENT: OP clear, mucus membranes moist  SKIN: warm, dry. No rashes. Neuro: No focal deficits  Musculoskeletal: Muscle strength 5/5 all ext  Psychiatric: Mood and affect normal  Neck: No JVD, no carotid bruits, no thyromegaly, no lymphadenopathy.  Lungs:Clear bilaterally, no wheezes, rhonci, crackles Cardiovascular: Regular rate and rhythm. *** Loud, harsh, late peaking systolic murmur.  Abdomen:Soft. Bowel sounds present. Non-tender.  Extremities: *** No lower extremity edema. Pulses are 2 + in the bilateral DP/PT.  EKG:  EKG {ACTION; IS/IS WNU:[id number]} ordered today. The ekg ordered today demonstrates ***  Echo ***  Recent Labs: 03/31/2023: Magnesium  1.6 04/25/2023: TSH 1.357 09/20/2023: ALT 16 11/19/2023: BUN 23; Creatinine, Ser 5.52; Hemoglobin 8.9; Platelets 129; Potassium 3.4; Sodium 134   Lipid Panel    Component Value Date/Time   CHOL 90 05/03/2023 0518   CHOL 104 06/10/2021 1337   TRIG 55 05/03/2023 0518   HDL 38 (L) 05/03/2023 0518   HDL 30 (L) 06/10/2021 1337   CHOLHDL 2.4 05/03/2023 0518   VLDL 11 05/03/2023 0518   LDLCALC 41 05/03/2023 0518   LDLCALC 47 11/23/2021 1330     Wt Readings from Last 3 Encounters:  02/14/24 215 lb (97.5 kg)  01/17/24 220 lb (99.8 kg)  11/19/23 216 lb (98 kg)     Assessment and Plan:   1. Severe Aortic Valve Stenosis: *** has severe, stage D aortic valve stenosis. *** has NYHA class *** symptoms. I have personally reviewed the echo images. The aortic valve is thickened and calcified with limited leaflet mobility. I think *** would benefit from AVR. Given advanced age, *** is not a good candidate for conventional AVR by surgical approach. I think *** may be a good candidate for TAVR.   I  have reviewed the natural history of aortic stenosis with the patient and their family members  who are present today. We have discussed the limitations of medical therapy and the poor prognosis associated with symptomatic aortic stenosis. We have reviewed potential treatment options, including palliative medical therapy, conventional surgical aortic valve replacement, and transcatheter aortic valve replacement. We discussed treatment options in the context of the patient's specific comorbid medical conditions.    *** would like to proceed with planning for TAVR. I will arrange a right and left heart catheterization at [facility] ***. Risks and benefits of the cath procedure and the valve procedure are reviewed with the patient. After the cath, *** will have a cardiac CT, CTA of the chest/abdomen and pelvis and will then be referred to see one of the CT surgeons on our TAVR team.     Labs/ tests ordered today  include:  No orders of the defined types were placed in this encounter.    Disposition:   F/U will be arranged with the structural team  Signed, Lonni Cash, MD, [facility] 03/03/2024 2:22 PM    [facility] [address] [place], [place], [place]  [address] Phone: [phone]; Fax: [phone]

## 2024-03-04 ENCOUNTER — Other Ambulatory Visit (HOSPITAL_COMMUNITY): Payer: Self-pay | Admitting: Gerontology

## 2024-03-04 ENCOUNTER — Ambulatory Visit (HOSPITAL_COMMUNITY)
Admission: RE | Admit: 2024-03-04 | Discharge: 2024-03-04 | Disposition: A | Discharge status: Home / Self Care | Source: Ambulatory Visit | Attending: Gerontology | Admitting: Gerontology

## 2024-03-04 ENCOUNTER — Ambulatory Visit: Admitting: Cardiovascular Disease

## 2024-03-04 VITALS — BP 112/62 | HR 67 | Ht 64.0 in | Wt 215.2 lb

## 2024-03-04 DIAGNOSIS — I350 Nonrheumatic aortic (valve) stenosis: Secondary | ICD-10-CM | POA: Diagnosis not present

## 2024-03-04 DIAGNOSIS — I10 Essential (primary) hypertension: Secondary | ICD-10-CM | POA: Diagnosis not present

## 2024-03-04 DIAGNOSIS — M549 Dorsalgia, unspecified: Secondary | ICD-10-CM | POA: Diagnosis not present

## 2024-03-04 DIAGNOSIS — J449 Chronic obstructive pulmonary disease, unspecified: Secondary | ICD-10-CM | POA: Diagnosis not present

## 2024-03-04 DIAGNOSIS — M545 Low back pain, unspecified: Secondary | ICD-10-CM | POA: Diagnosis not present

## 2024-03-04 DIAGNOSIS — M47816 Spondylosis without myelopathy or radiculopathy, lumbar region: Secondary | ICD-10-CM | POA: Diagnosis not present

## 2024-03-04 NOTE — Patient Instructions (Signed)
 Medication Instructions:  No medication changes were made at this visit. Continue current regimen.   *If you need a refill on your cardiac medications before your next appointment, please call your pharmacy*  Lab Work: None ordered today. If you have labs (blood work) drawn today and your tests are completely normal, you will receive your results only by: MyChart Message (if you have MyChart) OR A paper copy in the mail If you have any lab test that is abnormal or we need to change your treatment, we will call you to review the results.  Testing/Procedures: Your physician has requested that you have an echocardiogram in January 2026. Echocardiography is a painless test that uses sound waves to create images of your heart. It provides your doctor with information about the size and shape of your heart and how well your heart's chambers and valves are working. This procedure takes approximately one hour. There are no restrictions for this procedure. Please do NOT wear cologne, perfume, aftershave, or lotions (deodorant is allowed). Please arrive 15 minutes prior to your appointment time.  Please note: We ask at that you not bring children with you during ultrasound (echo/ vascular) testing. Due to room size and safety concerns, children are not allowed in the ultrasound rooms during exams. Our front office staff cannot provide observation of children in our lobby area while testing is being conducted. An adult accompanying a patient to their appointment will only be allowed in the ultrasound room at the discretion of the ultrasound technician under special circumstances. We apologize for any inconvenience.   Follow-Up: At [facility], you and your health needs are our priority.  As part of our continuing mission to provide you with exceptional heart care, our providers are all part of one team.  This team includes your primary Cardiologist (physician) and Advanced Practice Providers or  APPs (Physician Assistants and Nurse Practitioners) who all work together to provide you with the care you need, when you need it.  Your next appointment:   6 month(s) (February 2026- after echocardiogram has been completed)  Provider:   Lonni Cash, MD

## 2024-03-05 DIAGNOSIS — N2581 Secondary hyperparathyroidism of renal origin: Secondary | ICD-10-CM | POA: Diagnosis not present

## 2024-03-05 DIAGNOSIS — N186 End stage renal disease: Secondary | ICD-10-CM | POA: Diagnosis not present

## 2024-03-05 DIAGNOSIS — D631 Anemia in chronic kidney disease: Secondary | ICD-10-CM | POA: Diagnosis not present

## 2024-03-05 DIAGNOSIS — D509 Iron deficiency anemia, unspecified: Secondary | ICD-10-CM | POA: Diagnosis not present

## 2024-03-05 DIAGNOSIS — Z23 Encounter for immunization: Secondary | ICD-10-CM | POA: Diagnosis not present

## 2024-03-05 DIAGNOSIS — Z992 Dependence on renal dialysis: Secondary | ICD-10-CM | POA: Diagnosis not present

## 2024-03-06 ENCOUNTER — Ambulatory Visit: Admitting: Nurse Practitioner

## 2024-03-07 DIAGNOSIS — N2581 Secondary hyperparathyroidism of renal origin: Secondary | ICD-10-CM | POA: Diagnosis not present

## 2024-03-07 DIAGNOSIS — D631 Anemia in chronic kidney disease: Secondary | ICD-10-CM | POA: Diagnosis not present

## 2024-03-07 DIAGNOSIS — Z992 Dependence on renal dialysis: Secondary | ICD-10-CM | POA: Diagnosis not present

## 2024-03-07 DIAGNOSIS — N186 End stage renal disease: Secondary | ICD-10-CM | POA: Diagnosis not present

## 2024-03-07 DIAGNOSIS — D509 Iron deficiency anemia, unspecified: Secondary | ICD-10-CM | POA: Diagnosis not present

## 2024-03-07 DIAGNOSIS — Z23 Encounter for immunization: Secondary | ICD-10-CM | POA: Diagnosis not present

## 2024-03-09 DIAGNOSIS — N186 End stage renal disease: Secondary | ICD-10-CM | POA: Diagnosis not present

## 2024-03-09 DIAGNOSIS — D509 Iron deficiency anemia, unspecified: Secondary | ICD-10-CM | POA: Diagnosis not present

## 2024-03-09 DIAGNOSIS — Z23 Encounter for immunization: Secondary | ICD-10-CM | POA: Diagnosis not present

## 2024-03-09 DIAGNOSIS — Z992 Dependence on renal dialysis: Secondary | ICD-10-CM | POA: Diagnosis not present

## 2024-03-09 DIAGNOSIS — N2581 Secondary hyperparathyroidism of renal origin: Secondary | ICD-10-CM | POA: Diagnosis not present

## 2024-03-09 DIAGNOSIS — D631 Anemia in chronic kidney disease: Secondary | ICD-10-CM | POA: Diagnosis not present

## 2024-03-10 DIAGNOSIS — D509 Iron deficiency anemia, unspecified: Secondary | ICD-10-CM | POA: Diagnosis not present

## 2024-03-10 DIAGNOSIS — Z23 Encounter for immunization: Secondary | ICD-10-CM | POA: Diagnosis not present

## 2024-03-10 DIAGNOSIS — Z992 Dependence on renal dialysis: Secondary | ICD-10-CM | POA: Diagnosis not present

## 2024-03-10 DIAGNOSIS — D631 Anemia in chronic kidney disease: Secondary | ICD-10-CM | POA: Diagnosis not present

## 2024-03-10 DIAGNOSIS — N186 End stage renal disease: Secondary | ICD-10-CM | POA: Diagnosis not present

## 2024-03-10 DIAGNOSIS — N2581 Secondary hyperparathyroidism of renal origin: Secondary | ICD-10-CM | POA: Diagnosis not present

## 2024-03-12 DIAGNOSIS — Z23 Encounter for immunization: Secondary | ICD-10-CM | POA: Diagnosis not present

## 2024-03-12 DIAGNOSIS — N186 End stage renal disease: Secondary | ICD-10-CM | POA: Diagnosis not present

## 2024-03-12 DIAGNOSIS — D631 Anemia in chronic kidney disease: Secondary | ICD-10-CM | POA: Diagnosis not present

## 2024-03-12 DIAGNOSIS — Z992 Dependence on renal dialysis: Secondary | ICD-10-CM | POA: Diagnosis not present

## 2024-03-12 DIAGNOSIS — N2581 Secondary hyperparathyroidism of renal origin: Secondary | ICD-10-CM | POA: Diagnosis not present

## 2024-03-12 DIAGNOSIS — D509 Iron deficiency anemia, unspecified: Secondary | ICD-10-CM | POA: Diagnosis not present

## 2024-03-14 DIAGNOSIS — D631 Anemia in chronic kidney disease: Secondary | ICD-10-CM | POA: Diagnosis not present

## 2024-03-14 DIAGNOSIS — N2581 Secondary hyperparathyroidism of renal origin: Secondary | ICD-10-CM | POA: Diagnosis not present

## 2024-03-14 DIAGNOSIS — N186 End stage renal disease: Secondary | ICD-10-CM | POA: Diagnosis not present

## 2024-03-14 DIAGNOSIS — Z23 Encounter for immunization: Secondary | ICD-10-CM | POA: Diagnosis not present

## 2024-03-14 DIAGNOSIS — Z992 Dependence on renal dialysis: Secondary | ICD-10-CM | POA: Diagnosis not present

## 2024-03-14 DIAGNOSIS — D509 Iron deficiency anemia, unspecified: Secondary | ICD-10-CM | POA: Diagnosis not present

## 2024-03-16 DIAGNOSIS — N186 End stage renal disease: Secondary | ICD-10-CM | POA: Diagnosis not present

## 2024-03-16 DIAGNOSIS — Z992 Dependence on renal dialysis: Secondary | ICD-10-CM | POA: Diagnosis not present

## 2024-03-17 DIAGNOSIS — N186 End stage renal disease: Secondary | ICD-10-CM | POA: Diagnosis not present

## 2024-03-17 DIAGNOSIS — D631 Anemia in chronic kidney disease: Secondary | ICD-10-CM | POA: Diagnosis not present

## 2024-03-17 DIAGNOSIS — D509 Iron deficiency anemia, unspecified: Secondary | ICD-10-CM | POA: Diagnosis not present

## 2024-03-17 DIAGNOSIS — N250 Renal osteodystrophy: Secondary | ICD-10-CM | POA: Diagnosis not present

## 2024-03-17 DIAGNOSIS — Z992 Dependence on renal dialysis: Secondary | ICD-10-CM | POA: Diagnosis not present

## 2024-03-17 DIAGNOSIS — N2581 Secondary hyperparathyroidism of renal origin: Secondary | ICD-10-CM | POA: Diagnosis not present

## 2024-03-19 DIAGNOSIS — N250 Renal osteodystrophy: Secondary | ICD-10-CM | POA: Diagnosis not present

## 2024-03-19 DIAGNOSIS — N2581 Secondary hyperparathyroidism of renal origin: Secondary | ICD-10-CM | POA: Diagnosis not present

## 2024-03-19 DIAGNOSIS — Z992 Dependence on renal dialysis: Secondary | ICD-10-CM | POA: Diagnosis not present

## 2024-03-19 DIAGNOSIS — D509 Iron deficiency anemia, unspecified: Secondary | ICD-10-CM | POA: Diagnosis not present

## 2024-03-19 DIAGNOSIS — D631 Anemia in chronic kidney disease: Secondary | ICD-10-CM | POA: Diagnosis not present

## 2024-03-19 DIAGNOSIS — N186 End stage renal disease: Secondary | ICD-10-CM | POA: Diagnosis not present

## 2024-03-20 DIAGNOSIS — E1142 Type 2 diabetes mellitus with diabetic polyneuropathy: Secondary | ICD-10-CM | POA: Diagnosis not present

## 2024-03-20 DIAGNOSIS — B351 Tinea unguium: Secondary | ICD-10-CM | POA: Diagnosis not present

## 2024-03-21 DIAGNOSIS — D509 Iron deficiency anemia, unspecified: Secondary | ICD-10-CM | POA: Diagnosis not present

## 2024-03-21 DIAGNOSIS — N250 Renal osteodystrophy: Secondary | ICD-10-CM | POA: Diagnosis not present

## 2024-03-21 DIAGNOSIS — N2581 Secondary hyperparathyroidism of renal origin: Secondary | ICD-10-CM | POA: Diagnosis not present

## 2024-03-21 DIAGNOSIS — Z992 Dependence on renal dialysis: Secondary | ICD-10-CM | POA: Diagnosis not present

## 2024-03-21 DIAGNOSIS — N186 End stage renal disease: Secondary | ICD-10-CM | POA: Diagnosis not present

## 2024-03-21 DIAGNOSIS — D631 Anemia in chronic kidney disease: Secondary | ICD-10-CM | POA: Diagnosis not present

## 2024-03-24 DIAGNOSIS — N250 Renal osteodystrophy: Secondary | ICD-10-CM | POA: Diagnosis not present

## 2024-03-24 DIAGNOSIS — N2581 Secondary hyperparathyroidism of renal origin: Secondary | ICD-10-CM | POA: Diagnosis not present

## 2024-03-24 DIAGNOSIS — D631 Anemia in chronic kidney disease: Secondary | ICD-10-CM | POA: Diagnosis not present

## 2024-03-24 DIAGNOSIS — D509 Iron deficiency anemia, unspecified: Secondary | ICD-10-CM | POA: Diagnosis not present

## 2024-03-24 DIAGNOSIS — Z992 Dependence on renal dialysis: Secondary | ICD-10-CM | POA: Diagnosis not present

## 2024-03-24 DIAGNOSIS — N186 End stage renal disease: Secondary | ICD-10-CM | POA: Diagnosis not present

## 2024-03-26 DIAGNOSIS — Z992 Dependence on renal dialysis: Secondary | ICD-10-CM | POA: Diagnosis not present

## 2024-03-26 DIAGNOSIS — D509 Iron deficiency anemia, unspecified: Secondary | ICD-10-CM | POA: Diagnosis not present

## 2024-03-26 DIAGNOSIS — N2581 Secondary hyperparathyroidism of renal origin: Secondary | ICD-10-CM | POA: Diagnosis not present

## 2024-03-26 DIAGNOSIS — N186 End stage renal disease: Secondary | ICD-10-CM | POA: Diagnosis not present

## 2024-03-26 DIAGNOSIS — N250 Renal osteodystrophy: Secondary | ICD-10-CM | POA: Diagnosis not present

## 2024-03-26 DIAGNOSIS — D631 Anemia in chronic kidney disease: Secondary | ICD-10-CM | POA: Diagnosis not present

## 2024-03-27 DIAGNOSIS — N186 End stage renal disease: Secondary | ICD-10-CM | POA: Diagnosis not present

## 2024-03-27 DIAGNOSIS — I10 Essential (primary) hypertension: Secondary | ICD-10-CM | POA: Diagnosis not present

## 2024-03-27 DIAGNOSIS — J449 Chronic obstructive pulmonary disease, unspecified: Secondary | ICD-10-CM | POA: Diagnosis not present

## 2024-03-27 DIAGNOSIS — E1100 Type 2 diabetes mellitus with hyperosmolarity without nonketotic hyperglycemic-hyperosmolar coma (NKHHC): Secondary | ICD-10-CM | POA: Diagnosis not present

## 2024-03-28 DIAGNOSIS — D509 Iron deficiency anemia, unspecified: Secondary | ICD-10-CM | POA: Diagnosis not present

## 2024-03-28 DIAGNOSIS — Z992 Dependence on renal dialysis: Secondary | ICD-10-CM | POA: Diagnosis not present

## 2024-03-28 DIAGNOSIS — N2581 Secondary hyperparathyroidism of renal origin: Secondary | ICD-10-CM | POA: Diagnosis not present

## 2024-03-28 DIAGNOSIS — N186 End stage renal disease: Secondary | ICD-10-CM | POA: Diagnosis not present

## 2024-03-28 DIAGNOSIS — N250 Renal osteodystrophy: Secondary | ICD-10-CM | POA: Diagnosis not present

## 2024-03-28 DIAGNOSIS — D631 Anemia in chronic kidney disease: Secondary | ICD-10-CM | POA: Diagnosis not present

## 2024-03-31 ENCOUNTER — Encounter: Payer: Self-pay | Admitting: Orthopedic Surgery

## 2024-03-31 ENCOUNTER — Ambulatory Visit: Admitting: Orthopedic Surgery

## 2024-03-31 VITALS — BP 147/93 | HR 72 | Ht 64.0 in | Wt 214.0 lb

## 2024-03-31 DIAGNOSIS — Z992 Dependence on renal dialysis: Secondary | ICD-10-CM | POA: Diagnosis not present

## 2024-03-31 DIAGNOSIS — N250 Renal osteodystrophy: Secondary | ICD-10-CM | POA: Diagnosis not present

## 2024-03-31 DIAGNOSIS — D509 Iron deficiency anemia, unspecified: Secondary | ICD-10-CM | POA: Diagnosis not present

## 2024-03-31 DIAGNOSIS — D631 Anemia in chronic kidney disease: Secondary | ICD-10-CM | POA: Diagnosis not present

## 2024-03-31 DIAGNOSIS — N186 End stage renal disease: Secondary | ICD-10-CM | POA: Diagnosis not present

## 2024-03-31 DIAGNOSIS — M5441 Lumbago with sciatica, right side: Secondary | ICD-10-CM

## 2024-03-31 DIAGNOSIS — G8929 Other chronic pain: Secondary | ICD-10-CM | POA: Diagnosis not present

## 2024-03-31 DIAGNOSIS — N2581 Secondary hyperparathyroidism of renal origin: Secondary | ICD-10-CM | POA: Diagnosis not present

## 2024-03-31 MED ORDER — TIZANIDINE HCL 4 MG PO TABS: 4.0000 mg | ORAL_TABLET | Freq: Every day | ORAL | 1 refills | Status: DC

## 2024-03-31 MED ORDER — GABAPENTIN 300 MG PO CAPS: 300.0000 mg | ORAL_CAPSULE | Freq: Three times a day (TID) | ORAL | 2 refills | Status: DC

## 2024-03-31 MED ORDER — HYDROCODONE-ACETAMINOPHEN 5-325 MG PO TABS: 1.0000 | ORAL_TABLET | Freq: Four times a day (QID) | ORAL | 0 refills | Status: DC | PRN

## 2024-03-31 NOTE — Progress Notes (Signed)
  Intake history:  Chief Complaint  Patient presents with   Back Pain   Leg Pain    Right      BP (!) 147/93   Pulse 72   Ht 5' 4 (1.626 m)   Wt 214 lb (97.1 kg)   BMI 36.73 kg/m  Body mass index is 36.73 kg/m.  Pharmacy? _________ _____  [facility] [phone] - [place], [name] - [address]  _____  WHAT ARE WE SEEING YOU FOR TODAY?   back - right lower back  Radiation?: yes - right leg.   Loss of bowel/urine control?  no  How long has this bothered you? (DOI?DOS?WS?)  Couple months has gotten worse   Was there an injury? No  Anticoag.  No  Diabetes Yes  Heart disease Yes  Hypertension Yes  SMOKING HX No  Kidney disease Yes ESRD / dialysis   Any ALLERGIES ________________________ Allergies  Allergen Reactions   Tramadol  Itching   Opana [Oxymorphone Hcl] Itching   ______________________   Treatment:  Have you taken:  Tylenol  Yes  Advil  No  Had PT No  Had injection No  Other  _________________________

## 2024-03-31 NOTE — Patient Instructions (Signed)
While we are working on your approval for MRI please go ahead and call to schedule your appointment with [facility] within at least one (1) week.   [phone]

## 2024-03-31 NOTE — Progress Notes (Signed)
 Chief Complaint  Patient presents with   Back Pain   Leg Pain    Right     73 year old male with history of lower back pain recently got worse.  Patient says he has to be at the dialysis center and sit for long periods of time and his back is hurting and is radiating into his right thigh and the back and down to the knee  Reports no weakness loss of bowel or bladder function  Examination shows painful lower back with tenderness and decreased range of motion strength in his lower extremities are normal bilaterally  Straight leg raises are equivocal  Hip range of motion is normal  Gait normal  X-rays show degenerative disc disease throughout his lumbar spine most significant at L4-5  Patient is in a bad predicament.  He has multiple medical problems preventing the surgical intervention at this time.  Recommend medication MRI and epidural injection  Meds ordered this encounter  Medications   tiZANidine  (ZANAFLEX ) 4 MG tablet    Sig: Take 1 tablet (4 mg total) by mouth daily.    Dispense:  30 tablet    Refill:  1   HYDROcodone -acetaminophen  (NORCO/VICODIN) 5-325 MG tablet    Sig: Take 1 tablet by mouth every 6 (six) hours as needed for moderate pain (pain score 4-6).    Dispense:  30 tablet    Refill:  0   gabapentin  (NEURONTIN ) 300 MG capsule    Sig: Take 1 capsule (300 mg total) by mouth 3 (three) times daily.    Dispense:  60 capsule    Refill:  2   Hydrocodone  will not be refilled

## 2024-03-31 NOTE — Addendum Note (Signed)
 Addended byBETHA JENEAN GREIG LELON on: 03/31/2024 03:15 PM   Modules accepted: Orders

## 2024-04-01 ENCOUNTER — Ambulatory Visit (INDEPENDENT_AMBULATORY_CARE_PROVIDER_SITE_OTHER): Admitting: Nurse Practitioner

## 2024-04-01 ENCOUNTER — Encounter: Payer: Self-pay | Admitting: Nurse Practitioner

## 2024-04-01 VITALS — BP 116/78 | HR 66 | Ht 64.0 in | Wt 216.8 lb

## 2024-04-01 DIAGNOSIS — E1122 Type 2 diabetes mellitus with diabetic chronic kidney disease: Secondary | ICD-10-CM

## 2024-04-01 DIAGNOSIS — N186 End stage renal disease: Secondary | ICD-10-CM | POA: Diagnosis not present

## 2024-04-01 DIAGNOSIS — Z992 Dependence on renal dialysis: Secondary | ICD-10-CM

## 2024-04-01 DIAGNOSIS — I10 Essential (primary) hypertension: Secondary | ICD-10-CM

## 2024-04-01 DIAGNOSIS — E782 Mixed hyperlipidemia: Secondary | ICD-10-CM

## 2024-04-01 LAB — POCT GLYCOSYLATED HEMOGLOBIN (HGB A1C): Hemoglobin A1C: 6 % — AB (ref 4.0–5.6)

## 2024-04-01 NOTE — Progress Notes (Signed)
 04/01/2024   Endocrinology follow-up note   Subjective:    Patient ID: [name] [name], male    DOB: January 04, 1951, PCP Carlette Benita Area, MD   Past Medical History:  Diagnosis Date   Anemia    Arthritis    Asthma    Bell palsy    left side of face- comes and goes per pt on 11/12/23   CAD (coronary artery disease)    a. 2014 MV: abnl w/ infap ischemia; b. 03/2013 Cath: aneurysmal bleb in the LAD w/ otw nonobs dzs-->Med Rx.   Chronic back pain    Chronic kidney disease    on dialysis - mon wed fri   Chronic knee pain    a. 09/2015 s/p R TKA.   Chronic pain    Chronic shoulder pain    Chronic sinusitis    COPD (chronic obstructive pulmonary disease) (HCC)    Diabetes mellitus without complication (HCC)    type II , no meds, does not check blood sugar   Dyspnea    with exertion   Dysrhythmia    2nd degree AV block   ESRD on peritoneal dialysis (HCC)    on peritoneal dialysis, [facility] - mon wed fri   Essential hypertension    GERD (gastroesophageal reflux disease)    Gout    Hepatomegaly    noted on noncontrast CT 2015   History of hiatal hernia    Hyperlipidemia    Hypothyroidism    Lateral meniscus tear    Myocardial infarction (HCC) 07/2021   Neuromuscular disorder (HCC)    Obesity    Truncal   Obstructive sleep apnea    does not use cpap    On home oxygen  therapy    uses 2L when needed   Pneumonia    PUD (peptic ulcer disease)    remote, reports f/u EGD about 8 years ago unremarkable    Reactive airway disease    related to exposure to chemical during 9/11   Sinusitis    Substance abuse (HCC)    hx in past   Vitamin D  deficiency    Past Surgical History:  Procedure Laterality Date   A/V FISTULAGRAM N/A 02/28/2023   Procedure: A/V Fistulagram;  Surgeon: Lanis Fonda BRAVO, MD;  Location: [facility] INVASIVE CV LAB;  Service: Cardiovascular;  Laterality: N/A;   A/V FISTULAGRAM N/A 10/22/2023   Procedure: A/V Fistulagram;  Surgeon: Sheree Penne Bruckner, MD;  Location: [facility] INVASIVE CV LAB;  Service: Cardiovascular;  Laterality: N/A;   ASAD LT SHOULDER  12/15/2008   left shoulder   AV FISTULA PLACEMENT Left 08/09/2016   Procedure: BRACHIOCEPHALIC ARTERIOVENOUS (AV) FISTULA CREATION LEFT ARM;  Surgeon: Carlin BRAVO Haddock, MD;  Location: [facility] OR;  Service: Vascular;  Laterality: Left;   BASCILIC VEIN TRANSPOSITION Left 11/13/2023   Procedure: TRANSPOSITION, VEIN, BASILIC;  Surgeon: Sheree Penne Bruckner, MD;  Location: [facility] OR;  Service: Vascular;  Laterality: Left;   CAPD INSERTION N/A 10/07/2018   Procedure: LAPAROSCOPIC PERITONEAL CATHETER PLACEMENT;  Surgeon: Stevie Herlene Righter, MD;  Location: [facility] ORS;  Service: General;  Laterality: N/A;   CATARACT EXTRACTION W/PHACO Left 03/28/2016   Procedure: CATARACT EXTRACTION PHACO AND INTRAOCULAR LENS PLACEMENT LEFT EYE;  Surgeon: Oneil Platts, MD;  Location: [facility] ORS;  Service: Ophthalmology;  Laterality: Left;  CDE: 4.77   CATARACT EXTRACTION W/PHACO Right 04/11/2016   Procedure: CATARACT EXTRACTION PHACO AND INTRAOCULAR LENS PLACEMENT RIGHT EYE;  CDE:  4.74;  Surgeon: Oneil Platts, MD;  Location: [facility] ORS;  Service: Ophthalmology;  Laterality: Right;   COLONOSCOPY  10/15/2008   Fields: Rectal polyp obliterated, not retrieved, hemorrhoids, single ascending colon diverticulum near the CV. Next colonoscopy April 2020   COLONOSCOPY N/A 12/25/2014   SLF: 1. Colorectal polyps (2) removed 2. Small internal hemorrhoids 3. the left colon is severely redundant. hyperplastic polyps   CORONARY ARTERY BYPASS GRAFT N/A 06/05/2022   Procedure: OFF PUMP CORONARY ARTERY BYPASS GRAFTING (CABG) X 2 BYPASSES USING LEFT INTERNAL MAMMARY ARTERY AND RIGHT LEG GREATER SAPHENOUS VEIN HARVESTED ENDOSCOPICALLY;  Surgeon: Shyrl Linnie KIDD, MD;  Location: [facility] OR;  Service: Open Heart Surgery;  Laterality: N/A;   CORONARY STENT INTERVENTION N/A 07/25/2021   Procedure: CORONARY STENT INTERVENTION;  Surgeon: Wonda Sharper, MD;  Location: [facility] INVASIVE CV LAB;  Service: Cardiovascular;  Laterality: N/A;   CORONARY STENT INTERVENTION N/A 12/26/2021   Procedure: CORONARY STENT INTERVENTION;  Surgeon: Mady Bruckner, MD;  Location: [facility] INVASIVE CV LAB;  Service: Cardiovascular;  Laterality: N/A;   CORONARY STENT INTERVENTION N/A 01/20/2022   Procedure: CORONARY STENT INTERVENTION;  Surgeon: Wonda Sharper, MD;  Location: [facility] INVASIVE CV LAB;  Service: Cardiovascular;  Laterality: N/A;   DOPPLER ECHOCARDIOGRAPHY     ESOPHAGOGASTRODUODENOSCOPY N/A 12/25/2014   SLF: 1. Anemia most likely due to CRI, gastritis, gastric polyps 2. Moderate non-erosive gastriits and mild duodenitis.  3.TWo large gstric polyps removed.    EYE SURGERY  12/22/2010   tear duct probing-[place]   FOREIGN BODY REMOVAL  03/29/2011   Procedure: REMOVAL FOREIGN BODY EXTREMITY;  Surgeon: Taft Minerva, MD;  Location: [facility]S;  Service: Orthopedics;  Laterality: Right;  Removal Foreign Body Right Thumb   INSERTION OF DIALYSIS CATHETER N/A 11/13/2023   Procedure: INSERTION OF DIALYSIS CATHETER;  Surgeon: Sheree Penne Bruckner, MD;  Location: [facility] OR;  Service: Vascular;  Laterality: N/A;   IR FLUORO GUIDE CV LINE RIGHT  08/06/2018   IR FLUORO GUIDE CV LINE RIGHT  02/17/2023   IR US  GUIDE VASC ACCESS RIGHT  08/06/2018   IR US  GUIDE VASC ACCESS RIGHT  02/17/2023   KNEE ARTHROSCOPY  10/16/2007   left   KNEE ARTHROSCOPY WITH LATERAL MENISECTOMY Right 10/14/2015   Procedure: LEFT KNEE ARTHROSCOPY WITH PARTIAL LATERAL MENISECTOMY;  Surgeon: Taft FORBES Minerva, MD;  Location: [facility];  Service: Orthopedics;  Laterality: Right;   LEFT HEART CATH AND CORONARY ANGIOGRAPHY N/A 07/25/2021   Procedure: LEFT HEART CATH AND CORONARY ANGIOGRAPHY;  Surgeon: Wonda Sharper, MD;  Location: [facility] INVASIVE CV LAB;  Service: Cardiovascular;  Laterality: N/A;   LEFT HEART CATH AND CORONARY ANGIOGRAPHY N/A 12/26/2021   Procedure: LEFT HEART CATH AND CORONARY  ANGIOGRAPHY;  Surgeon: Mady Bruckner, MD;  Location: [facility] INVASIVE CV LAB;  Service: Cardiovascular;  Laterality: N/A;   LEFT HEART CATH AND CORONARY ANGIOGRAPHY N/A 01/20/2022   Procedure: LEFT HEART CATH AND CORONARY ANGIOGRAPHY;  Surgeon: Wonda Sharper, MD;  Location: [facility] INVASIVE CV LAB;  Service: Cardiovascular;  Laterality: N/A;   LEFT HEART CATHETERIZATION WITH CORONARY ANGIOGRAM N/A 03/28/2013   Procedure: LEFT HEART CATHETERIZATION WITH CORONARY ANGIOGRAM;  Surgeon: Alm LELON Clay, MD;  Location: [facility] CATH LAB;  Service: Cardiovascular;  Laterality: N/A;   NM MYOVIEW  LTD     PENILE PROSTHESIS IMPLANT N/A 08/16/2015   Procedure: PENILE PROTHESIS INFLATABLE, three piece, Excisional biopsy of Penile ulcer, Penile molding;  Surgeon: Arlena Gal, MD;  Location: [facility] ORS;  Service: Urology;  Laterality: N/A;  PENILE PROSTHESIS IMPLANT N/A 12/24/2017   Procedure: REMOVAL AND  REPLACEMENT  COLOPLAST PENILE PROSTHESIS;  Surgeon: Carolee Sherwood JONETTA DOUGLAS, MD;  Location: [facility] ORS;  Service: Urology;  Laterality: N/A;   PERIPHERAL VASCULAR BALLOON ANGIOPLASTY  02/28/2023   Procedure: PERIPHERAL VASCULAR BALLOON ANGIOPLASTY;  Surgeon: Lanis Fonda BRAVO, MD;  Location: [facility] INVASIVE CV LAB;  Service: Cardiovascular;;   PORT-A-CATH REMOVAL N/A 02/19/2023   Procedure: REMOVAL OF PERITONEAL DIALYSIS CATHETER;  Surgeon: Lanis Fonda BRAVO, MD;  Location: [facility] OR;  Service: Vascular;  Laterality: N/A;   QUADRICEPS TENDON REPAIR  07/21/2011   Procedure: REPAIR QUADRICEP TENDON;  Surgeon: Taft Minerva, MD;  Location: [facility];  Service: Orthopedics;  Laterality: Right;   REVISON OF ARTERIOVENOUS FISTULA Left 11/13/2023   Procedure: REVISON OF ARTERIOVENOUS FISTULA;  Surgeon: Sheree Penne Bruckner, MD;  Location: [facility] OR;  Service: Vascular;  Laterality: Left;   RIGHT/LEFT HEART CATH AND CORONARY ANGIOGRAPHY N/A 05/30/2022   Procedure: RIGHT/LEFT HEART CATH AND CORONARY ANGIOGRAPHY;  Surgeon: Dann Candyce RAMAN,  MD;  Location: [facility] INVASIVE CV LAB;  Service: Cardiovascular;  Laterality: N/A;   TEE WITHOUT CARDIOVERSION N/A 06/05/2022   Procedure: TRANSESOPHAGEAL ECHOCARDIOGRAM (TEE);  Surgeon: Shyrl Linnie KIDD, MD;  Location: [facility] OR;  Service: Open Heart Surgery;  Laterality: N/A;   TOENAIL EXCISION     removed x2-bilateral   UMBILICAL HERNIA REPAIR  07/17/2005   [place]   Social History   Socioeconomic History   Marital status: Married    Spouse name: Not on file   Number of children: 2   Years of education: 12th grade   Highest education level: Not on file  Occupational History   Occupation: disabled   Occupation:      Associate Professor: UNEMPLOYED  Tobacco Use   Smoking status: Former    Current packs/day: 0.00    Average packs/day: 1 pack/day for 25.0 years (25.0 ttl pk-yrs)    Types: Cigarettes    Start date: 03/27/1985    Quit date: 03/27/2010    Years since quitting: 14.0   Smokeless tobacco: Never   Tobacco comments:    Quit in 2011  Vaping Use   Vaping status: Never Used  Substance and Sexual Activity   Alcohol  use: Yes    Comment: occasionally liquor   Drug use: Yes    Types: Marijuana    Comment: cocaine- last time used- 11/24/2017 , marijuana- last use 11/10/23 withhold 24 hrs prior to procedure   Sexual activity: Yes    Birth control/protection: None  Other Topics Concern   Not on file  Social History Narrative   He quit smoking in 2010. 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 He developed pulmonary problems, became disabled because of lower airway disease in 2009.       WATCHES BASKETBALL. HIS TEAM IS [employer].   Social Drivers of Corporate investment banker Strain: Low Risk  (03/05/2023)   Received from [facility]   Overall Financial Resource Strain (CARDIA)    Difficulty of Paying Living Expenses: Not hard at all  Food Insecurity: No Food Insecurity (08/25/2023)   Hunger Vital Sign    Worried About Running Out of Food in the  Last Year: Never true    Ran Out of Food in the Last Year: Never true  Transportation Needs: No Transportation Needs (08/25/2023)   PRAPARE - Administrator, Civil Service (Medical): No    Lack of Transportation (Non-Medical): No  Physical Activity: Sufficiently Active (12/29/2021)   Exercise Vital Sign    Days of Exercise per Week: 3 days    Minutes of Exercise per Session: 60 min  Stress: Patient Declined (03/12/2023)   Received from [facility]   [facility] - Occupational Stress Questionnaire    Feeling of Stress : Patient declined  Social Connections: Socially Integrated (08/25/2023)   Social Connection and Isolation Panel    Frequency of Communication with Friends and Family: More than three times a week    Frequency of Social Gatherings with Friends and Family: More than three times a week    Attends Religious Services: More than 4 times per year    Active Member of [employer] or Organizations: Yes    Attends Banker Meetings: More than 4 times per year    Marital Status: Married   Outpatient Encounter Medications as of 04/01/2024  Medication Sig   acetaminophen  (TYLENOL ) 650 MG CR tablet Take 1,300 mg by mouth every 8 (eight) hours as needed for pain.   albuterol  (VENTOLIN  HFA) 108 (90 Base) MCG/ACT inhaler Inhale 2 puffs into the lungs every 6 (six) hours as needed for wheezing or shortness of breath.   amLODipine  (NORVASC ) 5 MG tablet Take 1 tablet (5 mg total) by mouth daily.   ascorbic acid  (VITAMIN C ) 500 MG tablet Take 1 tablet (500 mg total) by mouth daily.   aspirin  EC 81 MG tablet Take 81 mg by mouth daily. Swallow whole.   atorvastatin  (LIPITOR ) 80 MG tablet Take 1 tablet (80 mg total) by mouth daily.   carvedilol  (COREG ) 6.25 MG tablet Take 6.25 mg by mouth 2 (two) times daily.   divalproex  (DEPAKOTE ) 250 MG DR tablet Take by mouth.   esomeprazole  (NEXIUM ) 40 MG capsule Take 1 capsule (40 mg total) by mouth 2 (two) times  daily before a meal.   ferric citrate (AURYXIA) 1 GM 210 MG(Fe) tablet Take 420 mg by mouth 3 (three) times daily with meals.   fluticasone  furoate-vilanterol (BREO ELLIPTA ) 200-25 MCG/ACT AEPB Inhale into the lungs.   Fluticasone -Umeclidin-Vilant (TRELEGY ELLIPTA ) 200-62.5-25 MCG/ACT AEPB Inhale 1 puff into the lungs daily.   midodrine  (PROAMATINE ) 10 MG tablet Take 10 mg by mouth 3 (three) times a week. Mon, Wed, and Friday   OVER THE COUNTER MEDICATION Take 2 tablets by mouth daily at 6 (six) AM.   tamsulosin (FLOMAX) 0.4 MG CAPS capsule Take 0.4 mg by mouth daily after supper.   tiZANidine  (ZANAFLEX ) 4 MG tablet Take 1 tablet (4 mg total) by mouth daily.   traZODone  (DESYREL ) 50 MG tablet Take 50 mg by mouth at bedtime.   [DISCONTINUED] furosemide  (LASIX ) 80 MG tablet Take 80 mg by mouth 4 (four) times a week. Tues, Thurs, Sat, Sun (non-dialysis days) (Patient not taking: Reported on 04/01/2024)   [DISCONTINUED] gabapentin  (NEURONTIN ) 300 MG capsule Take 1 capsule (300 mg total) by mouth 3 (three) times daily. (Patient not taking: Reported on 04/01/2024)   [DISCONTINUED] HYDROcodone -acetaminophen  (NORCO/VICODIN) 5-325 MG tablet Take 1 tablet by mouth every 6 (six) hours as needed for moderate pain (pain score 4-6). (Patient not taking: Reported on 03/04/2024)   [DISCONTINUED] HYDROcodone -acetaminophen  (NORCO/VICODIN) 5-325 MG tablet Take 1 tablet by mouth every 6 (six) hours as needed for moderate pain (pain score 4-6). (Patient not taking: Reported on 04/01/2024)   [DISCONTINUED] loratadine  (CLARITIN ) 10 MG tablet Take 10 mg by mouth daily. (Patient not taking: Reported on 04/01/2024)   No facility-administered encounter medications  on file as of 04/01/2024.   ALLERGIES: Allergies  Allergen Reactions   Tramadol  Itching   Opana [Oxymorphone Hcl] Itching   VACCINATION STATUS: Immunization History  Administered Date(s) Administered   Fluad Quad(high Dose 65+) 04/16/2022   INFLUENZA, HIGH DOSE  SEASONAL PF 09/12/2018, 04/17/2019, 05/11/2020   Influenza-Unspecified 09/12/2018, 04/01/2019, 04/22/2021   Moderna Sars-Covid-2 Vaccination 09/18/2019, 10/16/2019, 05/24/2020   PPD Test 07/19/2018, 09/07/2018, 11/18/2019   Pneumococcal Polysaccharide-23 09/13/2018, 05/03/2021   Td 03/27/2011    Diabetes He presents for his follow-up diabetic visit. He has type 2 diabetes mellitus. Onset time: He was diagnosed approximately 20 years ago. His disease course has been stable. There are no hypoglycemic associated symptoms. Pertinent negatives for hypoglycemia include no confusion, headaches, pallor or seizures. Pertinent negatives for diabetes include no chest pain, no fatigue, no polydipsia, no polyphagia, no polyuria, no weakness and no weight loss. There are no hypoglycemic complications. Symptoms are stable. Diabetic complications include heart disease and nephropathy. (Patient with end-stage renal disease now on hemodialysis.) Risk factors for coronary artery disease include dyslipidemia, diabetes mellitus, hypertension, obesity, sedentary lifestyle, tobacco exposure and male sex. When asked about current treatments, none were reported. He is compliant with treatment all of the time. His weight is fluctuating minimally. He is following a generally healthy diet. When asked about meal planning, he reported none. He has had a previous visit with a dietitian. He rarely participates in exercise. His overall blood glucose range is 110-130 mg/dl. (He presents today, accompanied by his significant other, with his meter and logs showing at target glycemic profile.  His POCT A1c today was 6%, stable.  He does have chronic anemia resulting from ESRD so A1c may not be the most accurate measure of his diabetes control.   Analysis of his meter shows 7-day average of 117, 14-day average of 102, 30-day average of 106.  He has done well with lifestyle modifications alone.) An ACE inhibitor/angiotensin II receptor blocker  is being taken. He sees a podiatrist.Eye exam is current.  Hypertension This is a chronic problem. The current episode started more than 1 year ago. The problem has been resolved since onset. The problem is controlled. Pertinent negatives include no chest pain, headaches, neck pain, palpitations or shortness of breath. There are no associated agents to hypertension. Risk factors for coronary artery disease include diabetes mellitus, dyslipidemia, obesity, smoking/tobacco exposure, sedentary lifestyle and male gender. Past treatments include angiotensin blockers, calcium  channel blockers, diuretics and beta blockers. There are no compliance problems.  Hypertensive end-organ damage includes kidney disease and CAD/MI. Identifiable causes of hypertension include chronic renal disease and sleep apnea.  Hyperlipidemia This is a chronic problem. The current episode started more than 1 year ago. The problem is controlled. Recent lipid tests were reviewed and are normal (LDL normal at 63 triglycerides elevated at 157.). Exacerbating diseases include chronic renal disease, diabetes and obesity. Factors aggravating his hyperlipidemia include fatty foods. Pertinent negatives include no chest pain, myalgias or shortness of breath. Current antihyperlipidemic treatment includes statins. The current treatment provides moderate improvement of lipids. There are no compliance problems.  Risk factors for coronary artery disease include diabetes mellitus, dyslipidemia, hypertension, male sex, a sedentary lifestyle and obesity.    Review of systems  Constitutional: + stable body weight,  current Body mass index is 37.21 kg/m. , no fatigue, no subjective hyperthermia, no subjective hypothermia Eyes: no blurry vision, no xerophthalmia ENT: no sore throat, no nodules palpated in throat, no dysphagia/odynophagia, no hoarseness Cardiovascular: no chest  pain, no shortness of breath, no palpitations, no leg swelling Respiratory:  no cough, no shortness of breath Gastrointestinal: no nausea/vomiting/diarrhea Musculoskeletal: no muscle/joint aches Skin: no rashes, no hyperemia Neurological: no tremors, no numbness, no tingling, no dizziness Psychiatric: no depression, no anxiety   Objective:    BP 116/78 (BP Location: Right Arm, Patient Position: Sitting, Cuff Size: Large)   Pulse 66   Ht 5' 4 (1.626 m)   Wt 216 lb 12.8 oz (98.3 kg)   BMI 37.21 kg/m   Wt Readings from Last 3 Encounters:  04/01/24 216 lb 12.8 oz (98.3 kg)  03/31/24 214 lb (97.1 kg)  03/04/24 215 lb 3.2 oz (97.6 kg)    BP Readings from Last 3 Encounters:  04/01/24 116/78  03/31/24 (!) 147/93  03/04/24 112/62     Physical Exam- Limited  Constitutional:  Body mass index is 37.21 kg/m. , not in acute distress, normal state of mind Eyes:  EOMI, no exophthalmos Musculoskeletal: no gross deformities, strength intact in all four extremities, no gross restriction of joint movements Skin:  no rashes, no hyperemia Neurological: no tremor with outstretched hands   Diabetic Foot Exam - Simple   Simple Foot Form Diabetic Foot exam was performed with the following findings: Yes 04/01/2024  8:08 AM  Visual Inspection No deformities, no ulcerations, no other skin breakdown bilaterally: Yes Sensation Testing Intact to touch and monofilament testing bilaterally: Yes Pulse Check Posterior Tibialis and Dorsalis pulse intact bilaterally: Yes Comments      Results for orders placed or performed in visit on 04/01/24  HgB A1c   Collection Time: 04/01/24  8:14 AM  Result Value Ref Range   Hemoglobin A1C 6.0 (A) 4.0 - 5.6 %   HbA1c POC (<> result, manual entry)     HbA1c, POC (prediabetic range)     HbA1c, POC (controlled diabetic range)     *Note: Due to a large number of results and/or encounters for the requested time period, some results have not been displayed. A complete set of results can be found in Results Review.   Diabetic Labs  (most recent): Lab Results  Component Value Date   HGBA1C 6.0 (A) 04/01/2024   HGBA1C 4.9 05/03/2023   HGBA1C 6.3 (H) 03/04/2023   MICROALBUR 7.4 02/09/2020   MICROALBUR 57.0 06/26/2017   Lipid Panel     Component Value Date/Time   CHOL 90 05/03/2023 0518   CHOL 104 06/10/2021 1337   TRIG 55 05/03/2023 0518   HDL 38 (L) 05/03/2023 0518   HDL 30 (L) 06/10/2021 1337   CHOLHDL 2.4 05/03/2023 0518   VLDL 11 05/03/2023 0518   LDLCALC 41 05/03/2023 0518   LDLCALC 47 11/23/2021 1330     Assessment & Plan:   1) Type 2 diabetes mellitus with ESRD on HD, with long-term current use of insulin  (HCC)  -His  Diabetes is complicated by end-stage renal disease recently initiated on hemodialysis,  and patient remains at a high risk for more acute and chronic complications of diabetes which include CAD, CVA, CKD, retinopathy, and neuropathy. These are all discussed in detail with the patient.  He presents today, accompanied by his significant other, with his meter and logs showing at target glycemic profile.  His POCT A1c today was 6%, stable.  He does have chronic anemia resulting from ESRD so A1c may not be the most accurate measure of his diabetes control.   Analysis of his meter shows 7-day average of 117, 14-day average of 102, 30-day  average of 106.  He has done well with lifestyle modifications alone.   Recent labs reviewed.  - Nutritional counseling repeated at each appointment due to patients tendency to fall back in to old habits.  - The patient admits there is a room for improvement in their diet and drink choices. -  Suggestion is made for the patient to avoid simple carbohydrates from their diet including Cakes, Sweet Desserts / Pastries, Ice Cream, Soda (diet and regular), Sweet Tea, Candies, Chips, Cookies, Sweet Pastries, Store Bought Juices, Alcohol  in Excess of 1-2 drinks a day, Artificial Sweeteners, Coffee Creamer, and Sugar-free Products. This will help patient to have  stable blood glucose profile and potentially avoid unintended weight gain.   - I encouraged the patient to switch to unprocessed or minimally processed complex starch and increased protein intake (animal or plant source), fruits, and vegetables.   - Patient is advised to stick to a routine mealtimes to eat 3 meals a day and avoid unnecessary snacks (to snack only to correct hypoglycemia).  - I have approached patient with the following individualized plan to manage diabetes and patient agrees.  -He is advised to stay away from alcohol  and smoking.  -He is advised to stay off his diabetes medications at this time.  I did ask that he monitor fasting glucose 1-2 times per week (especially during the holidays) and notify me if readings are above 200 consistently.  -He will continue to follow-up with nephrology given stage 5 CKD on PD.  -he is not a candidate for metformin and SGLT2 inhibitors due to CKD.   - Patient specific target  for A1c; LDL, HDL, Triglycerides, were discussed in detail.  2) BP/HTN:  His blood pressure is controlled to target. He is advised to continue medications per PCP or nephrology recommendation.    3) Lipids/HPL:  His recent lipid panel from 05/03/23 shows controlled LDL stable at 41.  He is advised to continue Simvastatin  20 mg p.o. nightly.  Has appt in December with PCP for fasting labs.  I asked that he have copy of any labs sent to us  for our records as well.  4)  Weight/Diet: His Body mass index is 37.21 kg/m.- clearly complicating his DM care. He is a candidate for modest weight loss.   CDE consult in progress, exercise, and carbohydrates information provided.  5) Chronic Care/Health Maintenance: -Patient is on ARB and Statin medications and encouraged to continue to follow up with Ophthalmology, nephrology (he is status post fistula placement in prep for hemodialysis) Podiatrist at least yearly or according to recommendations, and advised to quit smoking. I  have recommended yearly flu vaccine and pneumonia vaccination at least every 5 years; moderate intensity exercise for up to 150 minutes weekly; and  sleep for at least 7 hours a day.  - I advised patient to maintain close follow up with his PCP for primary care needs.        I spent  22  minutes in the care of the patient today including review of labs from CMP, Lipids, Thyroid  Function, Hematology (current and previous including abstractions from other facilities); face-to-face time discussing  his blood glucose readings/logs, discussing hypoglycemia and hyperglycemia episodes and symptoms, medications doses, his options of short and long term treatment based on the latest standards of care / guidelines;  discussion about incorporating lifestyle medicine;  and documenting the encounter. Risk reduction counseling performed per USPSTF guidelines to reduce obesity and cardiovascular risk factors.     Please  refer to Patient Instructions for Blood Glucose Monitoring and Insulin /Medications Dosing Guide  in media tab for additional information. Please  also refer to  Patient Self Inventory in the Media  tab for reviewed elements of pertinent patient history.  [name] [name] in the discussions, expressed understanding, and voiced agreement with the above plans.  All questions were answered to his satisfaction. he is encouraged to contact clinic should he have any questions or concerns prior to his return visit.    Follow up plan: -Return in about 6 months (around 09/29/2024) for Diabetes F/U with A1c in office, No previsit labs.    Benton Rio, [facility] [address] [place], [place] [address] Phone: [phone] Fax: [phone]   04/01/2024, 8:16 AM

## 2024-04-02 DIAGNOSIS — D631 Anemia in chronic kidney disease: Secondary | ICD-10-CM | POA: Diagnosis not present

## 2024-04-02 DIAGNOSIS — N2581 Secondary hyperparathyroidism of renal origin: Secondary | ICD-10-CM | POA: Diagnosis not present

## 2024-04-02 DIAGNOSIS — N250 Renal osteodystrophy: Secondary | ICD-10-CM | POA: Diagnosis not present

## 2024-04-02 DIAGNOSIS — D509 Iron deficiency anemia, unspecified: Secondary | ICD-10-CM | POA: Diagnosis not present

## 2024-04-02 DIAGNOSIS — N186 End stage renal disease: Secondary | ICD-10-CM | POA: Diagnosis not present

## 2024-04-02 DIAGNOSIS — Z992 Dependence on renal dialysis: Secondary | ICD-10-CM | POA: Diagnosis not present

## 2024-04-04 DIAGNOSIS — N2581 Secondary hyperparathyroidism of renal origin: Secondary | ICD-10-CM | POA: Diagnosis not present

## 2024-04-04 DIAGNOSIS — N250 Renal osteodystrophy: Secondary | ICD-10-CM | POA: Diagnosis not present

## 2024-04-04 DIAGNOSIS — Z992 Dependence on renal dialysis: Secondary | ICD-10-CM | POA: Diagnosis not present

## 2024-04-04 DIAGNOSIS — D509 Iron deficiency anemia, unspecified: Secondary | ICD-10-CM | POA: Diagnosis not present

## 2024-04-04 DIAGNOSIS — D631 Anemia in chronic kidney disease: Secondary | ICD-10-CM | POA: Diagnosis not present

## 2024-04-04 DIAGNOSIS — N186 End stage renal disease: Secondary | ICD-10-CM | POA: Diagnosis not present

## 2024-04-07 DIAGNOSIS — N2581 Secondary hyperparathyroidism of renal origin: Secondary | ICD-10-CM | POA: Diagnosis not present

## 2024-04-07 DIAGNOSIS — D509 Iron deficiency anemia, unspecified: Secondary | ICD-10-CM | POA: Diagnosis not present

## 2024-04-07 DIAGNOSIS — D631 Anemia in chronic kidney disease: Secondary | ICD-10-CM | POA: Diagnosis not present

## 2024-04-07 DIAGNOSIS — N250 Renal osteodystrophy: Secondary | ICD-10-CM | POA: Diagnosis not present

## 2024-04-07 DIAGNOSIS — Z992 Dependence on renal dialysis: Secondary | ICD-10-CM | POA: Diagnosis not present

## 2024-04-07 DIAGNOSIS — N186 End stage renal disease: Secondary | ICD-10-CM | POA: Diagnosis not present

## 2024-04-09 DIAGNOSIS — Z992 Dependence on renal dialysis: Secondary | ICD-10-CM | POA: Diagnosis not present

## 2024-04-09 DIAGNOSIS — N250 Renal osteodystrophy: Secondary | ICD-10-CM | POA: Diagnosis not present

## 2024-04-09 DIAGNOSIS — N186 End stage renal disease: Secondary | ICD-10-CM | POA: Diagnosis not present

## 2024-04-09 DIAGNOSIS — N2581 Secondary hyperparathyroidism of renal origin: Secondary | ICD-10-CM | POA: Diagnosis not present

## 2024-04-09 DIAGNOSIS — D631 Anemia in chronic kidney disease: Secondary | ICD-10-CM | POA: Diagnosis not present

## 2024-04-09 DIAGNOSIS — D509 Iron deficiency anemia, unspecified: Secondary | ICD-10-CM | POA: Diagnosis not present

## 2024-04-10 ENCOUNTER — Ambulatory Visit: Admitting: Gastroenterology

## 2024-04-10 ENCOUNTER — Telehealth: Payer: Self-pay | Admitting: *Deleted

## 2024-04-10 VITALS — BP 122/67 | HR 52 | Temp 98.7°F | Ht 64.0 in | Wt 216.0 lb

## 2024-04-10 DIAGNOSIS — K7460 Unspecified cirrhosis of liver: Secondary | ICD-10-CM | POA: Diagnosis not present

## 2024-04-10 DIAGNOSIS — Z860102 Personal history of hyperplastic colon polyps: Secondary | ICD-10-CM

## 2024-04-10 NOTE — Telephone Encounter (Signed)
 [facility]  US  scheduled for Tuesday 04/15/24, arrive at 10:15 am to check in at [facility].

## 2024-04-10 NOTE — Patient Instructions (Signed)
 Please have blood work completed when you are able.  We are also arranging an ultrasound for routine purposes in the future.  You will still need the Hepatitis A vaccination: you may be able to ask your pharmacy or Health Department.  We will see you back in 6 months!  I enjoyed seeing you again today! I value our relationship and want to provide genuine, compassionate, and quality care. You may receive a survey regarding your visit with me, and I welcome your feedback! Thanks so much for taking the time to complete this. I look forward to seeing you again.      Therisa MICAEL Stager, PhD, ANP-BC [facility]

## 2024-04-10 NOTE — Progress Notes (Addendum)
 Gastroenterology Office Note     Primary Care Physician:  Carlette Benita Area, MD  Primary Gastroenterologist: Dr. Cindie   Chief Complaint   Chief Complaint  Patient presents with   Follow-up    Follow up on other cirrhosis of liver     History of Present Illness   BOY DELAMATER is a 73 y.o. male presenting today with a history of GERD, anemia multifactorial with chronic disease/iron deficiency, previously on peritoneal dialysis but now on dialysis, cirrhosis, gallbladder polyp, recently found to have severe aortic stenosis, returning in follow-up for cirrhosis. Last seen Feb 2025.   He has seen cardiology with plans for ECHO in Jan 2026. Possibly may be a candidate for TAVR when symptomatic.    Completed Hep B vaccination. Needs Hep A vaccination. Acute hepatitis panel negative.  Hep C negative Sept 2024. Thorough serologies with ASMA very weakly positive but IgG, IgM normal. No iron overload. Ferritin elevated. ANA, AMA negative, ceruloplasmin not low but mildly elevated at 31, suspect reactant in setting of acute illness as well as ferritin. Will recheck now.   Feels like he has a knot in left sided abdomen at site of prior catheter for PD. No diarrhea. Sometimes feels like he doesn't empty out well. BM every day. Denies hard stool. No rectal bleeding. Stool dark but on ferric citrate. No mental status changes or confusion. No hematochezia.    US  abdomen March 2025: cirrhotic liver, small volume ascites, possible sludge ball vs polyp of gallbadder. Platelets in the 120s  Recommended EGD for variceal screening. However, he has been diagnosed with severe aortic valve stenosis. Saw Cardiology Aug 2025. Will have repeat ECHO in Jan 2026. Will need to hold off on EGD at this time due to severe AS.     Hyperplastic polyps. Colonoscopy 2026.    Past Medical History:  Diagnosis Date   Anemia    Arthritis    Asthma    Bell palsy    left side of face- comes and goes  per pt on 11/12/23   CAD (coronary artery disease)    a. 2014 MV: abnl w/ infap ischemia; b. 03/2013 Cath: aneurysmal bleb in the LAD w/ otw nonobs dzs-->Med Rx.   Chronic back pain    Chronic kidney disease    on dialysis - mon wed fri   Chronic knee pain    a. 09/2015 s/p R TKA.   Chronic pain    Chronic shoulder pain    Chronic sinusitis    COPD (chronic obstructive pulmonary disease) (HCC)    Diabetes mellitus without complication (HCC)    type II , no meds, does not check blood sugar   Dyspnea    with exertion   Dysrhythmia    2nd degree AV block   ESRD on peritoneal dialysis (HCC)    on peritoneal dialysis, [facility] - mon wed fri   Essential hypertension    GERD (gastroesophageal reflux disease)    Gout    Hepatomegaly    noted on noncontrast CT 2015   History of hiatal hernia    Hyperlipidemia    Hypothyroidism    Lateral meniscus tear    Myocardial infarction (HCC) 07/2021   Neuromuscular disorder (HCC)    Obesity    Truncal   Obstructive sleep apnea    does not use cpap    On home oxygen  therapy    uses 2L when needed   Pneumonia    PUD (peptic ulcer disease)  remote, reports f/u EGD about 8 years ago unremarkable    Reactive airway disease    related to exposure to chemical during 9/11   Sinusitis    Substance abuse (HCC)    hx in past   Vitamin D  deficiency     Past Surgical History:  Procedure Laterality Date   A/V FISTULAGRAM N/A 02/28/2023   Procedure: A/V Fistulagram;  Surgeon: Lanis Fonda BRAVO, MD;  Location: [facility] INVASIVE CV LAB;  Service: Cardiovascular;  Laterality: N/A;   A/V FISTULAGRAM N/A 10/22/2023   Procedure: A/V Fistulagram;  Surgeon: Sheree Penne Bruckner, MD;  Location: [facility] INVASIVE CV LAB;  Service: Cardiovascular;  Laterality: N/A;   ASAD LT SHOULDER  12/15/2008   left shoulder   AV FISTULA PLACEMENT Left 08/09/2016   Procedure: BRACHIOCEPHALIC ARTERIOVENOUS (AV) FISTULA CREATION LEFT ARM;  Surgeon: Carlin BRAVO Haddock, MD;   Location: [facility] OR;  Service: Vascular;  Laterality: Left;   BASCILIC VEIN TRANSPOSITION Left 11/13/2023   Procedure: TRANSPOSITION, VEIN, BASILIC;  Surgeon: Sheree Penne Bruckner, MD;  Location: [facility] OR;  Service: Vascular;  Laterality: Left;   CAPD INSERTION N/A 10/07/2018   Procedure: LAPAROSCOPIC PERITONEAL CATHETER PLACEMENT;  Surgeon: Stevie Herlene Righter, MD;  Location: [facility] ORS;  Service: General;  Laterality: N/A;   CATARACT EXTRACTION W/PHACO Left 03/28/2016   Procedure: CATARACT EXTRACTION PHACO AND INTRAOCULAR LENS PLACEMENT LEFT EYE;  Surgeon: Oneil Platts, MD;  Location: [facility] ORS;  Service: Ophthalmology;  Laterality: Left;  CDE: 4.77   CATARACT EXTRACTION W/PHACO Right 04/11/2016   Procedure: CATARACT EXTRACTION PHACO AND INTRAOCULAR LENS PLACEMENT RIGHT EYE; CDE:  4.74;  Surgeon: Oneil Platts, MD;  Location: [facility]S;  Service: Ophthalmology;  Laterality: Right;   COLONOSCOPY  10/15/2008   Fields: Rectal polyp obliterated, not retrieved, hemorrhoids, single ascending colon diverticulum near the CV. Next colonoscopy April 2020   COLONOSCOPY N/A 12/25/2014   SLF: 1. Colorectal polyps (2) removed 2. Small internal hemorrhoids 3. the left colon is severely redundant. hyperplastic polyps   CORONARY ARTERY BYPASS GRAFT N/A 06/05/2022   Procedure: OFF PUMP CORONARY ARTERY BYPASS GRAFTING (CABG) X 2 BYPASSES USING LEFT INTERNAL MAMMARY ARTERY AND RIGHT LEG GREATER SAPHENOUS VEIN HARVESTED ENDOSCOPICALLY;  Surgeon: Shyrl Linnie KIDD, MD;  Location: [facility] OR;  Service: Open Heart Surgery;  Laterality: N/A;   CORONARY STENT INTERVENTION N/A 07/25/2021   Procedure: CORONARY STENT INTERVENTION;  Surgeon: Wonda Sharper, MD;  Location: [facility] INVASIVE CV LAB;  Service: Cardiovascular;  Laterality: N/A;   CORONARY STENT INTERVENTION N/A 12/26/2021   Procedure: CORONARY STENT INTERVENTION;  Surgeon: Mady Bruckner, MD;  Location: [facility] INVASIVE CV LAB;  Service: Cardiovascular;  Laterality: N/A;   CORONARY  STENT INTERVENTION N/A 01/20/2022   Procedure: CORONARY STENT INTERVENTION;  Surgeon: Wonda Sharper, MD;  Location: [facility] INVASIVE CV LAB;  Service: Cardiovascular;  Laterality: N/A;   DOPPLER ECHOCARDIOGRAPHY     ESOPHAGOGASTRODUODENOSCOPY N/A 12/25/2014   SLF: 1. Anemia most likely due to CRI, gastritis, gastric polyps 2. Moderate non-erosive gastriits and mild duodenitis.  3.TWo large gstric polyps removed.    EYE SURGERY  12/22/2010   tear duct probing-[place]   FOREIGN BODY REMOVAL  03/29/2011   Procedure: REMOVAL FOREIGN BODY EXTREMITY;  Surgeon: Taft Minerva, MD;  Location: [facility];  Service: Orthopedics;  Laterality: Right;  Removal Foreign Body Right Thumb   INSERTION OF DIALYSIS CATHETER N/A 11/13/2023   Procedure: INSERTION OF DIALYSIS CATHETER;  Surgeon: Sheree Penne Bruckner, MD;  Location: [facility] OR;  Service: Vascular;  Laterality: N/A;  IR FLUORO GUIDE CV LINE RIGHT  08/06/2018   IR FLUORO GUIDE CV LINE RIGHT  02/17/2023   IR US  GUIDE VASC ACCESS RIGHT  08/06/2018   IR US  GUIDE VASC ACCESS RIGHT  02/17/2023   KNEE ARTHROSCOPY  10/16/2007   left   KNEE ARTHROSCOPY WITH LATERAL MENISECTOMY Right 10/14/2015   Procedure: LEFT KNEE ARTHROSCOPY WITH PARTIAL LATERAL MENISECTOMY;  Surgeon: Taft FORBES Minerva, MD;  Location: [facility];  Service: Orthopedics;  Laterality: Right;   LEFT HEART CATH AND CORONARY ANGIOGRAPHY N/A 07/25/2021   Procedure: LEFT HEART CATH AND CORONARY ANGIOGRAPHY;  Surgeon: Wonda Sharper, MD;  Location: [facility] INVASIVE CV LAB;  Service: Cardiovascular;  Laterality: N/A;   LEFT HEART CATH AND CORONARY ANGIOGRAPHY N/A 12/26/2021   Procedure: LEFT HEART CATH AND CORONARY ANGIOGRAPHY;  Surgeon: Mady Bruckner, MD;  Location: [facility] INVASIVE CV LAB;  Service: Cardiovascular;  Laterality: N/A;   LEFT HEART CATH AND CORONARY ANGIOGRAPHY N/A 01/20/2022   Procedure: LEFT HEART CATH AND CORONARY ANGIOGRAPHY;  Surgeon: Wonda Sharper, MD;  Location: [facility] INVASIVE CV LAB;   Service: Cardiovascular;  Laterality: N/A;   LEFT HEART CATHETERIZATION WITH CORONARY ANGIOGRAM N/A 03/28/2013   Procedure: LEFT HEART CATHETERIZATION WITH CORONARY ANGIOGRAM;  Surgeon: Alm LELON Clay, MD;  Location: [facility] CATH LAB;  Service: Cardiovascular;  Laterality: N/A;   NM MYOVIEW  LTD     PENILE PROSTHESIS IMPLANT N/A 08/16/2015   Procedure: PENILE PROTHESIS INFLATABLE, three piece, Excisional biopsy of Penile ulcer, Penile molding;  Surgeon: Arlena Gal, MD;  Location: [facility];  Service: Urology;  Laterality: N/A;   PENILE PROSTHESIS IMPLANT N/A 12/24/2017   Procedure: REMOVAL AND  REPLACEMENT  COLOPLAST PENILE PROSTHESIS;  Surgeon: Carolee Sherwood JONETTA DOUGLAS, MD;  Location: [facility]S;  Service: Urology;  Laterality: N/A;   PERIPHERAL VASCULAR BALLOON ANGIOPLASTY  02/28/2023   Procedure: PERIPHERAL VASCULAR BALLOON ANGIOPLASTY;  Surgeon: Lanis Fonda FORBES, MD;  Location: [facility] INVASIVE CV LAB;  Service: Cardiovascular;;   PORT-A-CATH REMOVAL N/A 02/19/2023   Procedure: REMOVAL OF PERITONEAL DIALYSIS CATHETER;  Surgeon: Lanis Fonda FORBES, MD;  Location: [facility] OR;  Service: Vascular;  Laterality: N/A;   QUADRICEPS TENDON REPAIR  07/21/2011   Procedure: REPAIR QUADRICEP TENDON;  Surgeon: Taft Minerva, MD;  Location: [facility];  Service: Orthopedics;  Laterality: Right;   REVISON OF ARTERIOVENOUS FISTULA Left 11/13/2023   Procedure: REVISON OF ARTERIOVENOUS FISTULA;  Surgeon: Sheree Penne Bruckner, MD;  Location: [facility] OR;  Service: Vascular;  Laterality: Left;   RIGHT/LEFT HEART CATH AND CORONARY ANGIOGRAPHY N/A 05/30/2022   Procedure: RIGHT/LEFT HEART CATH AND CORONARY ANGIOGRAPHY;  Surgeon: Dann Candyce RAMAN, MD;  Location: [facility] INVASIVE CV LAB;  Service: Cardiovascular;  Laterality: N/A;   TEE WITHOUT CARDIOVERSION N/A 06/05/2022   Procedure: TRANSESOPHAGEAL ECHOCARDIOGRAM (TEE);  Surgeon: Shyrl Linnie KIDD, MD;  Location: [facility] OR;  Service: Open Heart Surgery;  Laterality: N/A;   TOENAIL EXCISION      removed x2-bilateral   UMBILICAL HERNIA REPAIR  07/17/2005   [place]    Current Outpatient Medications  Medication Sig Dispense Refill   acetaminophen  (TYLENOL ) 650 MG CR tablet Take 1,300 mg by mouth every 8 (eight) hours as needed for pain.     albuterol  (VENTOLIN  HFA) 108 (90 Base) MCG/ACT inhaler Inhale 2 puffs into the lungs every 6 (six) hours as needed for wheezing or shortness of breath. 18 g 11   amLODipine  (NORVASC ) 5 MG tablet Take 1 tablet (5 mg total) by mouth daily. 30 tablet 5  ascorbic acid  (VITAMIN C ) 500 MG tablet Take 1 tablet (500 mg total) by mouth daily. 100 tablet 0   aspirin  EC 81 MG tablet Take 81 mg by mouth daily. Swallow whole.     atorvastatin  (LIPITOR ) 80 MG tablet Take 1 tablet (80 mg total) by mouth daily. 30 tablet 1   carvedilol  (COREG ) 6.25 MG tablet Take 6.25 mg by mouth 2 (two) times daily.     divalproex  (DEPAKOTE ) 250 MG DR tablet Take by mouth.     esomeprazole  (NEXIUM ) 40 MG capsule Take 1 capsule (40 mg total) by mouth 2 (two) times daily before a meal. 180 capsule 0   ferric citrate (AURYXIA) 1 GM 210 MG(Fe) tablet Take 420 mg by mouth 3 (three) times daily with meals.     Fluticasone -Umeclidin-Vilant (TRELEGY ELLIPTA ) 200-62.5-25 MCG/ACT AEPB Inhale 1 puff into the lungs daily. 28 each 5   midodrine  (PROAMATINE ) 10 MG tablet Take 10 mg by mouth 3 (three) times a week. Mon, Wed, and Friday     OVER THE COUNTER MEDICATION Take 2 tablets by mouth daily at 6 (six) AM.     tamsulosin (FLOMAX) 0.4 MG CAPS capsule Take 0.4 mg by mouth daily after supper.     tiZANidine  (ZANAFLEX ) 4 MG tablet Take 1 tablet (4 mg total) by mouth daily. 30 tablet 1   traZODone  (DESYREL ) 50 MG tablet Take 50 mg by mouth at bedtime.     fluticasone  furoate-vilanterol (BREO ELLIPTA ) 200-25 MCG/ACT AEPB Inhale into the lungs. (Patient not taking: Reported on 04/10/2024)     No current facility-administered medications for this visit.    Allergies as of 04/10/2024 - Review  Complete 04/10/2024  Allergen Reaction Noted   Tramadol  Itching 07/10/2011   Opana [oxymorphone hcl] Itching 01/15/2014    Family History  Problem Relation Age of Onset   Hypertension Mother        MI   Cancer Mother        breast    Diabetes Mother    Diabetes Father    Hypertension Father    Hypertension Sister    Diabetes Sister    Arthritis Other    Asthma Other    Lung disease Other    Anesthesia problems Neg Hx    Hypotension Neg Hx    Malignant hyperthermia Neg Hx    Pseudochol deficiency Neg Hx    Colon cancer Neg Hx     Social History   Socioeconomic History   Marital status: Married    Spouse name: Not on file   Number of children: 2   Years of education: 12th grade   Highest education level: Not on file  Occupational History   Occupation: disabled   Occupation:      Associate Professor: UNEMPLOYED  Tobacco Use   Smoking status: Former    Current packs/day: 0.00    Average packs/day: 1 pack/day for 25.0 years (25.0 ttl pk-yrs)    Types: Cigarettes    Start date: 03/27/1985    Quit date: 03/27/2010    Years since quitting: 14.0   Smokeless tobacco: Never   Tobacco comments:    Quit in 2011  Vaping Use   Vaping status: Never Used  Substance and Sexual Activity   Alcohol  use: Yes    Comment: occasionally liquor   Drug use: Yes    Types: Marijuana    Comment: cocaine- last time used- 11/24/2017 , marijuana- last use 11/10/23 withhold 24 hrs prior to procedure   Sexual activity:  Yes    Birth control/protection: None  Other Topics Concern   Not on file  Social History Narrative   He quit smoking in 2010. 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 He developed pulmonary problems, became disabled because of lower airway disease in 2009.       WATCHES BASKETBALL. HIS TEAM IS [place].   Social Drivers of Corporate investment banker Strain: Low Risk  (03/05/2023)   Received from [facility]   Overall Financial Resource  Strain (CARDIA)    Difficulty of Paying Living Expenses: Not hard at all  Food Insecurity: No Food Insecurity (08/25/2023)   Hunger Vital Sign    Worried About Running Out of Food in the Last Year: Never true    Ran Out of Food in the Last Year: Never true  Transportation Needs: No Transportation Needs (08/25/2023)   PRAPARE - Administrator, Civil Service (Medical): No    Lack of Transportation (Non-Medical): No  Physical Activity: Sufficiently Active (12/29/2021)   Exercise Vital Sign    Days of Exercise per Week: 3 days    Minutes of Exercise per Session: 60 min  Stress: Patient Declined (03/12/2023)   Received from [facility]   [employer] of Occupational Health - Occupational Stress Questionnaire    Feeling of Stress : Patient declined  Social Connections: Socially Integrated (08/25/2023)   Social Connection and Isolation Panel    Frequency of Communication with Friends and Family: More than three times a week    Frequency of Social Gatherings with Friends and Family: More than three times a week    Attends Religious Services: More than 4 times per year    Active Member of [employer] or Organizations: Yes    Attends Engineer, structural: More than 4 times per year    Marital Status: Married  Catering manager Violence: Not At Risk (08/25/2023)   Humiliation, Afraid, Rape, and Kick questionnaire    Fear of Current or Ex-Partner: No    Emotionally Abused: No    Physically Abused: No    Sexually Abused: No     Review of Systems   Gen: Denies any fever, chills, fatigue, weight loss, lack of appetite.  CV: Denies chest pain, heart palpitations, peripheral edema, syncope.  Resp: Denies shortness of breath at rest or with exertion. Denies wheezing or cough.  GI: Denies dysphagia or odynophagia. Denies jaundice, hematemesis, fecal incontinence. GU : Denies urinary burning, urinary frequency, urinary hesitancy MS: Denies joint pain, muscle weakness, cramps, or  limitation of movement.  Derm: Denies rash, itching, dry skin Psych: Denies depression, anxiety, memory loss, and confusion Heme: Denies bruising, bleeding, and enlarged lymph nodes.   Physical Exam   BP 122/67   Pulse (!) 52   Temp 98.7 F (37.1 C)   Ht 5' 4 (1.626 m)   Wt 216 lb (98 kg)   BMI 37.08 kg/m  General:   Alert and oriented. Pleasant and cooperative. Well-nourished and well-developed.  Head:  Normocephalic and atraumatic. Eyes:  Without icterus Abdomen:  +BS, soft, distended but soft, no rebound or guarding, left-sided scar tissue at incision from PD catheter Rectal:  Deferred  Msk:  Symmetrical without gross deformities. Normal posture. Extremities:  Without edema. Neurologic:  Alert and  oriented x4;  grossly normal neurologically. Skin:  Intact without significant lesions or rashes. Psych:  Alert and cooperative. Normal mood and affect.   Assessment   Norleen ORN  Wieman is a 73 y.o. male presenting today with a history of GERD, anemia multifactorial with chronic disease/iron deficiency, previously on peritoneal dialysis but now on dialysis, cirrhosis, gallbladder polyp, recently found to have severe aortic stenosis, returning in follow-up for cirrhosis.   Cirrhosis: will update MELD labs now. Update ASMA as weakly positive in past, ceruloplasmin update as mildly elevated and suspect acute phase reactant. Screening EGD would be recommended at this time but not a candidate for anesthesia currently in light of severe aortic stenosis. Will continue to follow with serial ultrasounds, labs, and can consider endoscopic measures once optimized from a cardiac standpoint. Completed Hep B vaccination but needs Hep A.       Hyperplastic polyps. Colonoscopy 2026.    PLAN    RUQ US  abdomen MELD labs, recheck ASMA and ceruloplasmin Hep A vaccination recommended Holding off on procedures due to severe AS Can add Miralax  once daily prn Return in 6 months  regardless   Therisa MICAEL Stager, PhD, ANP-BC [facility] Gastroenter[facility]

## 2024-04-11 DIAGNOSIS — D631 Anemia in chronic kidney disease: Secondary | ICD-10-CM | POA: Diagnosis not present

## 2024-04-11 DIAGNOSIS — Z992 Dependence on renal dialysis: Secondary | ICD-10-CM | POA: Diagnosis not present

## 2024-04-11 DIAGNOSIS — N186 End stage renal disease: Secondary | ICD-10-CM | POA: Diagnosis not present

## 2024-04-11 DIAGNOSIS — N250 Renal osteodystrophy: Secondary | ICD-10-CM | POA: Diagnosis not present

## 2024-04-11 DIAGNOSIS — N2581 Secondary hyperparathyroidism of renal origin: Secondary | ICD-10-CM | POA: Diagnosis not present

## 2024-04-11 DIAGNOSIS — D509 Iron deficiency anemia, unspecified: Secondary | ICD-10-CM | POA: Diagnosis not present

## 2024-04-11 NOTE — Telephone Encounter (Signed)
 Spoke to pt and informed him of US  appointment. He asked to call home # and leave message on there because he was driving. LM on home # with appt details.

## 2024-04-14 DIAGNOSIS — N2581 Secondary hyperparathyroidism of renal origin: Secondary | ICD-10-CM | POA: Diagnosis not present

## 2024-04-14 DIAGNOSIS — D631 Anemia in chronic kidney disease: Secondary | ICD-10-CM | POA: Diagnosis not present

## 2024-04-14 DIAGNOSIS — N250 Renal osteodystrophy: Secondary | ICD-10-CM | POA: Diagnosis not present

## 2024-04-14 DIAGNOSIS — N186 End stage renal disease: Secondary | ICD-10-CM | POA: Diagnosis not present

## 2024-04-14 DIAGNOSIS — D509 Iron deficiency anemia, unspecified: Secondary | ICD-10-CM | POA: Diagnosis not present

## 2024-04-14 DIAGNOSIS — Z992 Dependence on renal dialysis: Secondary | ICD-10-CM | POA: Diagnosis not present

## 2024-04-15 ENCOUNTER — Other Ambulatory Visit (HOSPITAL_COMMUNITY)
Admission: RE | Admit: 2024-04-15 | Discharge: 2024-04-15 | Disposition: A | Discharge status: Home / Self Care | Source: Ambulatory Visit | Attending: Gastroenterology | Admitting: Gastroenterology

## 2024-04-15 ENCOUNTER — Ambulatory Visit (HOSPITAL_COMMUNITY)
Admission: RE | Admit: 2024-04-15 | Discharge: 2024-04-15 | Disposition: A | Discharge status: Home / Self Care | Source: Ambulatory Visit | Attending: Gastroenterology | Admitting: Gastroenterology

## 2024-04-15 ENCOUNTER — Telehealth: Payer: Self-pay | Admitting: *Deleted

## 2024-04-15 DIAGNOSIS — K7460 Unspecified cirrhosis of liver: Secondary | ICD-10-CM | POA: Insufficient documentation

## 2024-04-15 DIAGNOSIS — N186 End stage renal disease: Secondary | ICD-10-CM | POA: Diagnosis not present

## 2024-04-15 DIAGNOSIS — Z992 Dependence on renal dialysis: Secondary | ICD-10-CM | POA: Diagnosis not present

## 2024-04-15 LAB — PROTIME-INR
INR: 1.1 (ref 0.8–1.2)
Prothrombin Time: 14.7 s (ref 11.4–15.2)

## 2024-04-15 LAB — CBC WITH DIFFERENTIAL/PLATELET
Abs Immature Granulocytes: 0.01 K/uL (ref 0.00–0.07)
Basophils Absolute: 0 K/uL (ref 0.0–0.1)
Basophils Relative: 0 %
Eosinophils Absolute: 0.1 K/uL (ref 0.0–0.5)
Eosinophils Relative: 2 %
HCT: 38.4 % — ABNORMAL LOW (ref 39.0–52.0)
Hemoglobin: 12.1 g/dL — ABNORMAL LOW (ref 13.0–17.0)
Immature Granulocytes: 0 %
Lymphocytes Relative: 14 %
Lymphs Abs: 0.7 K/uL (ref 0.7–4.0)
MCH: 28.3 pg (ref 26.0–34.0)
MCHC: 31.5 g/dL (ref 30.0–36.0)
MCV: 89.9 fL (ref 80.0–100.0)
Monocytes Absolute: 0.6 K/uL (ref 0.1–1.0)
Monocytes Relative: 12 %
Neutro Abs: 3.3 K/uL (ref 1.7–7.7)
Neutrophils Relative %: 72 %
Platelets: 126 K/uL — ABNORMAL LOW (ref 150–400)
RBC: 4.27 MIL/uL (ref 4.22–5.81)
RDW: 20 % — ABNORMAL HIGH (ref 11.5–15.5)
WBC: 4.7 K/uL (ref 4.0–10.5)
nRBC: 0 % (ref 0.0–0.2)

## 2024-04-15 LAB — COMPREHENSIVE METABOLIC PANEL WITH GFR
ALT: 15 U/L (ref 0–44)
AST: 19 U/L (ref 15–41)
Albumin: 4.1 g/dL (ref 3.5–5.0)
Alkaline Phosphatase: 75 U/L (ref 38–126)
Anion gap: 15 (ref 5–15)
BUN: 43 mg/dL — ABNORMAL HIGH (ref 8–23)
CO2: 27 mmol/L (ref 22–32)
Calcium: 9.6 mg/dL (ref 8.9–10.3)
Chloride: 98 mmol/L (ref 98–111)
Creatinine, Ser: 9.77 mg/dL — ABNORMAL HIGH (ref 0.61–1.24)
GFR, Estimated: 5 mL/min — ABNORMAL LOW (ref >60)
Glucose, Bld: 104 mg/dL — ABNORMAL HIGH (ref 70–99)
Potassium: 5.2 mmol/L — ABNORMAL HIGH (ref 3.5–5.1)
Sodium: 139 mmol/L (ref 135–145)
Total Bilirubin: 0.5 mg/dL (ref 0.0–1.2)
Total Protein: 7.4 g/dL (ref 6.5–8.1)

## 2024-04-15 NOTE — Telephone Encounter (Signed)
 [name] [name] is here he supposed to have an MRI done on the 15 and they told him due to an implant he has they have to investigate. He has not heard anything . What to do?  Please call to advise ok to leave a message.

## 2024-04-15 NOTE — Telephone Encounter (Signed)
 What implant does he have/ he answered no in screening  He has penile implant  MRI compatible per card he has in [facility] Needs open scan scheduled at [facility]   I sent email to [facility] to see if he can have study there he needs open scan  To you FYI

## 2024-04-16 DIAGNOSIS — Z23 Encounter for immunization: Secondary | ICD-10-CM | POA: Diagnosis not present

## 2024-04-16 DIAGNOSIS — N250 Renal osteodystrophy: Secondary | ICD-10-CM | POA: Diagnosis not present

## 2024-04-16 DIAGNOSIS — Z992 Dependence on renal dialysis: Secondary | ICD-10-CM | POA: Diagnosis not present

## 2024-04-16 DIAGNOSIS — N186 End stage renal disease: Secondary | ICD-10-CM | POA: Diagnosis not present

## 2024-04-16 DIAGNOSIS — N2581 Secondary hyperparathyroidism of renal origin: Secondary | ICD-10-CM | POA: Diagnosis not present

## 2024-04-16 DIAGNOSIS — D631 Anemia in chronic kidney disease: Secondary | ICD-10-CM | POA: Diagnosis not present

## 2024-04-16 DIAGNOSIS — D509 Iron deficiency anemia, unspecified: Secondary | ICD-10-CM | POA: Diagnosis not present

## 2024-04-16 LAB — AFP TUMOR MARKER: AFP, Serum, Tumor Marker: 3.5 ng/mL (ref 0.0–8.4)

## 2024-04-16 LAB — CERULOPLASMIN: Ceruloplasmin: 35.4 mg/dL — ABNORMAL HIGH (ref 16.0–31.0)

## 2024-04-16 NOTE — Telephone Encounter (Signed)
 He can have study at [facility]  I left message to advise.

## 2024-04-18 DIAGNOSIS — D509 Iron deficiency anemia, unspecified: Secondary | ICD-10-CM | POA: Diagnosis not present

## 2024-04-18 DIAGNOSIS — N186 End stage renal disease: Secondary | ICD-10-CM | POA: Diagnosis not present

## 2024-04-18 DIAGNOSIS — Z992 Dependence on renal dialysis: Secondary | ICD-10-CM | POA: Diagnosis not present

## 2024-04-18 DIAGNOSIS — Z23 Encounter for immunization: Secondary | ICD-10-CM | POA: Diagnosis not present

## 2024-04-18 DIAGNOSIS — D631 Anemia in chronic kidney disease: Secondary | ICD-10-CM | POA: Diagnosis not present

## 2024-04-18 DIAGNOSIS — N2581 Secondary hyperparathyroidism of renal origin: Secondary | ICD-10-CM | POA: Diagnosis not present

## 2024-04-20 LAB — ANTI-SMOOTH MUSCLE ANTIBODY, IGG: F-Actin IgG: 22 U — ABNORMAL HIGH (ref 0–19)

## 2024-04-21 DIAGNOSIS — D509 Iron deficiency anemia, unspecified: Secondary | ICD-10-CM | POA: Diagnosis not present

## 2024-04-21 DIAGNOSIS — N2581 Secondary hyperparathyroidism of renal origin: Secondary | ICD-10-CM | POA: Diagnosis not present

## 2024-04-21 DIAGNOSIS — N186 End stage renal disease: Secondary | ICD-10-CM | POA: Diagnosis not present

## 2024-04-21 DIAGNOSIS — Z23 Encounter for immunization: Secondary | ICD-10-CM | POA: Diagnosis not present

## 2024-04-21 DIAGNOSIS — D631 Anemia in chronic kidney disease: Secondary | ICD-10-CM | POA: Diagnosis not present

## 2024-04-21 DIAGNOSIS — Z992 Dependence on renal dialysis: Secondary | ICD-10-CM | POA: Diagnosis not present

## 2024-04-23 DIAGNOSIS — Z992 Dependence on renal dialysis: Secondary | ICD-10-CM | POA: Diagnosis not present

## 2024-04-23 DIAGNOSIS — N2581 Secondary hyperparathyroidism of renal origin: Secondary | ICD-10-CM | POA: Diagnosis not present

## 2024-04-23 DIAGNOSIS — D631 Anemia in chronic kidney disease: Secondary | ICD-10-CM | POA: Diagnosis not present

## 2024-04-23 DIAGNOSIS — Z23 Encounter for immunization: Secondary | ICD-10-CM | POA: Diagnosis not present

## 2024-04-23 DIAGNOSIS — D509 Iron deficiency anemia, unspecified: Secondary | ICD-10-CM | POA: Diagnosis not present

## 2024-04-23 DIAGNOSIS — N186 End stage renal disease: Secondary | ICD-10-CM | POA: Diagnosis not present

## 2024-04-25 DIAGNOSIS — M4807 Spinal stenosis, lumbosacral region: Secondary | ICD-10-CM | POA: Diagnosis not present

## 2024-04-25 DIAGNOSIS — M4726 Other spondylosis with radiculopathy, lumbar region: Secondary | ICD-10-CM | POA: Diagnosis not present

## 2024-04-25 DIAGNOSIS — Z992 Dependence on renal dialysis: Secondary | ICD-10-CM | POA: Diagnosis not present

## 2024-04-25 DIAGNOSIS — M48061 Spinal stenosis, lumbar region without neurogenic claudication: Secondary | ICD-10-CM | POA: Diagnosis not present

## 2024-04-25 DIAGNOSIS — N2581 Secondary hyperparathyroidism of renal origin: Secondary | ICD-10-CM | POA: Diagnosis not present

## 2024-04-25 DIAGNOSIS — N186 End stage renal disease: Secondary | ICD-10-CM | POA: Diagnosis not present

## 2024-04-25 DIAGNOSIS — D509 Iron deficiency anemia, unspecified: Secondary | ICD-10-CM | POA: Diagnosis not present

## 2024-04-25 DIAGNOSIS — Z23 Encounter for immunization: Secondary | ICD-10-CM | POA: Diagnosis not present

## 2024-04-25 DIAGNOSIS — D631 Anemia in chronic kidney disease: Secondary | ICD-10-CM | POA: Diagnosis not present

## 2024-04-26 ENCOUNTER — Ambulatory Visit: Payer: Self-pay | Admitting: Gastroenterology

## 2024-04-28 DIAGNOSIS — Z992 Dependence on renal dialysis: Secondary | ICD-10-CM | POA: Diagnosis not present

## 2024-04-28 DIAGNOSIS — N2581 Secondary hyperparathyroidism of renal origin: Secondary | ICD-10-CM | POA: Diagnosis not present

## 2024-04-28 DIAGNOSIS — D509 Iron deficiency anemia, unspecified: Secondary | ICD-10-CM | POA: Diagnosis not present

## 2024-04-28 DIAGNOSIS — N186 End stage renal disease: Secondary | ICD-10-CM | POA: Diagnosis not present

## 2024-04-28 DIAGNOSIS — Z23 Encounter for immunization: Secondary | ICD-10-CM | POA: Diagnosis not present

## 2024-04-28 DIAGNOSIS — D631 Anemia in chronic kidney disease: Secondary | ICD-10-CM | POA: Diagnosis not present

## 2024-04-28 NOTE — Progress Notes (Signed)
 Noted recall for US 

## 2024-04-30 DIAGNOSIS — D509 Iron deficiency anemia, unspecified: Secondary | ICD-10-CM | POA: Diagnosis not present

## 2024-04-30 DIAGNOSIS — N2581 Secondary hyperparathyroidism of renal origin: Secondary | ICD-10-CM | POA: Diagnosis not present

## 2024-04-30 DIAGNOSIS — Z23 Encounter for immunization: Secondary | ICD-10-CM | POA: Diagnosis not present

## 2024-04-30 DIAGNOSIS — N186 End stage renal disease: Secondary | ICD-10-CM | POA: Diagnosis not present

## 2024-04-30 DIAGNOSIS — Z992 Dependence on renal dialysis: Secondary | ICD-10-CM | POA: Diagnosis not present

## 2024-04-30 DIAGNOSIS — D631 Anemia in chronic kidney disease: Secondary | ICD-10-CM | POA: Diagnosis not present

## 2024-05-02 DIAGNOSIS — Z992 Dependence on renal dialysis: Secondary | ICD-10-CM | POA: Diagnosis not present

## 2024-05-02 DIAGNOSIS — N186 End stage renal disease: Secondary | ICD-10-CM | POA: Diagnosis not present

## 2024-05-02 DIAGNOSIS — Z23 Encounter for immunization: Secondary | ICD-10-CM | POA: Diagnosis not present

## 2024-05-02 DIAGNOSIS — D509 Iron deficiency anemia, unspecified: Secondary | ICD-10-CM | POA: Diagnosis not present

## 2024-05-02 DIAGNOSIS — D631 Anemia in chronic kidney disease: Secondary | ICD-10-CM | POA: Diagnosis not present

## 2024-05-02 DIAGNOSIS — N2581 Secondary hyperparathyroidism of renal origin: Secondary | ICD-10-CM | POA: Diagnosis not present

## 2024-05-05 DIAGNOSIS — Z992 Dependence on renal dialysis: Secondary | ICD-10-CM | POA: Diagnosis not present

## 2024-05-05 DIAGNOSIS — N186 End stage renal disease: Secondary | ICD-10-CM | POA: Diagnosis not present

## 2024-05-05 DIAGNOSIS — Z23 Encounter for immunization: Secondary | ICD-10-CM | POA: Diagnosis not present

## 2024-05-05 DIAGNOSIS — D631 Anemia in chronic kidney disease: Secondary | ICD-10-CM | POA: Diagnosis not present

## 2024-05-05 DIAGNOSIS — N2581 Secondary hyperparathyroidism of renal origin: Secondary | ICD-10-CM | POA: Diagnosis not present

## 2024-05-05 DIAGNOSIS — D509 Iron deficiency anemia, unspecified: Secondary | ICD-10-CM | POA: Diagnosis not present

## 2024-05-07 DIAGNOSIS — D631 Anemia in chronic kidney disease: Secondary | ICD-10-CM | POA: Diagnosis not present

## 2024-05-07 DIAGNOSIS — N2581 Secondary hyperparathyroidism of renal origin: Secondary | ICD-10-CM | POA: Diagnosis not present

## 2024-05-07 DIAGNOSIS — Z23 Encounter for immunization: Secondary | ICD-10-CM | POA: Diagnosis not present

## 2024-05-07 DIAGNOSIS — Z992 Dependence on renal dialysis: Secondary | ICD-10-CM | POA: Diagnosis not present

## 2024-05-07 DIAGNOSIS — D509 Iron deficiency anemia, unspecified: Secondary | ICD-10-CM | POA: Diagnosis not present

## 2024-05-07 DIAGNOSIS — N186 End stage renal disease: Secondary | ICD-10-CM | POA: Diagnosis not present

## 2024-05-09 DIAGNOSIS — Z23 Encounter for immunization: Secondary | ICD-10-CM | POA: Diagnosis not present

## 2024-05-09 DIAGNOSIS — N186 End stage renal disease: Secondary | ICD-10-CM | POA: Diagnosis not present

## 2024-05-09 DIAGNOSIS — D509 Iron deficiency anemia, unspecified: Secondary | ICD-10-CM | POA: Diagnosis not present

## 2024-05-09 DIAGNOSIS — N2581 Secondary hyperparathyroidism of renal origin: Secondary | ICD-10-CM | POA: Diagnosis not present

## 2024-05-09 DIAGNOSIS — Z992 Dependence on renal dialysis: Secondary | ICD-10-CM | POA: Diagnosis not present

## 2024-05-09 DIAGNOSIS — D631 Anemia in chronic kidney disease: Secondary | ICD-10-CM | POA: Diagnosis not present

## 2024-05-12 DIAGNOSIS — Z23 Encounter for immunization: Secondary | ICD-10-CM | POA: Diagnosis not present

## 2024-05-12 DIAGNOSIS — N2581 Secondary hyperparathyroidism of renal origin: Secondary | ICD-10-CM | POA: Diagnosis not present

## 2024-05-12 DIAGNOSIS — D509 Iron deficiency anemia, unspecified: Secondary | ICD-10-CM | POA: Diagnosis not present

## 2024-05-12 DIAGNOSIS — D631 Anemia in chronic kidney disease: Secondary | ICD-10-CM | POA: Diagnosis not present

## 2024-05-12 DIAGNOSIS — N186 End stage renal disease: Secondary | ICD-10-CM | POA: Diagnosis not present

## 2024-05-12 DIAGNOSIS — Z992 Dependence on renal dialysis: Secondary | ICD-10-CM | POA: Diagnosis not present

## 2024-05-14 DIAGNOSIS — N2581 Secondary hyperparathyroidism of renal origin: Secondary | ICD-10-CM | POA: Diagnosis not present

## 2024-05-14 DIAGNOSIS — Z992 Dependence on renal dialysis: Secondary | ICD-10-CM | POA: Diagnosis not present

## 2024-05-14 DIAGNOSIS — D631 Anemia in chronic kidney disease: Secondary | ICD-10-CM | POA: Diagnosis not present

## 2024-05-14 DIAGNOSIS — N186 End stage renal disease: Secondary | ICD-10-CM | POA: Diagnosis not present

## 2024-05-14 DIAGNOSIS — D509 Iron deficiency anemia, unspecified: Secondary | ICD-10-CM | POA: Diagnosis not present

## 2024-05-14 DIAGNOSIS — Z23 Encounter for immunization: Secondary | ICD-10-CM | POA: Diagnosis not present

## 2024-05-16 DIAGNOSIS — N2581 Secondary hyperparathyroidism of renal origin: Secondary | ICD-10-CM | POA: Diagnosis not present

## 2024-05-16 DIAGNOSIS — N186 End stage renal disease: Secondary | ICD-10-CM | POA: Diagnosis not present

## 2024-05-16 DIAGNOSIS — D631 Anemia in chronic kidney disease: Secondary | ICD-10-CM | POA: Diagnosis not present

## 2024-05-16 DIAGNOSIS — D509 Iron deficiency anemia, unspecified: Secondary | ICD-10-CM | POA: Diagnosis not present

## 2024-05-16 DIAGNOSIS — Z992 Dependence on renal dialysis: Secondary | ICD-10-CM | POA: Diagnosis not present

## 2024-05-16 DIAGNOSIS — Z23 Encounter for immunization: Secondary | ICD-10-CM | POA: Diagnosis not present

## 2024-05-19 DIAGNOSIS — D631 Anemia in chronic kidney disease: Secondary | ICD-10-CM | POA: Diagnosis not present

## 2024-05-19 DIAGNOSIS — N186 End stage renal disease: Secondary | ICD-10-CM | POA: Diagnosis not present

## 2024-05-19 DIAGNOSIS — N250 Renal osteodystrophy: Secondary | ICD-10-CM | POA: Diagnosis not present

## 2024-05-19 DIAGNOSIS — D509 Iron deficiency anemia, unspecified: Secondary | ICD-10-CM | POA: Diagnosis not present

## 2024-05-19 DIAGNOSIS — Z992 Dependence on renal dialysis: Secondary | ICD-10-CM | POA: Diagnosis not present

## 2024-05-19 DIAGNOSIS — N2581 Secondary hyperparathyroidism of renal origin: Secondary | ICD-10-CM | POA: Diagnosis not present

## 2024-05-20 DIAGNOSIS — Z23 Encounter for immunization: Secondary | ICD-10-CM | POA: Diagnosis not present

## 2024-05-20 DIAGNOSIS — N186 End stage renal disease: Secondary | ICD-10-CM | POA: Diagnosis not present

## 2024-05-20 DIAGNOSIS — D631 Anemia in chronic kidney disease: Secondary | ICD-10-CM | POA: Diagnosis not present

## 2024-05-20 DIAGNOSIS — E875 Hyperkalemia: Secondary | ICD-10-CM | POA: Diagnosis not present

## 2024-05-20 DIAGNOSIS — Z992 Dependence on renal dialysis: Secondary | ICD-10-CM | POA: Diagnosis not present

## 2024-05-20 DIAGNOSIS — J810 Acute pulmonary edema: Secondary | ICD-10-CM | POA: Diagnosis not present

## 2024-05-20 DIAGNOSIS — I120 Hypertensive chronic kidney disease with stage 5 chronic kidney disease or end stage renal disease: Secondary | ICD-10-CM | POA: Diagnosis not present

## 2024-05-21 ENCOUNTER — Emergency Department (HOSPITAL_COMMUNITY)
Admission: EM | Admit: 2024-05-21 | Discharge: 2024-05-22 | Disposition: A | Attending: Emergency Medicine | Admitting: Emergency Medicine

## 2024-05-21 ENCOUNTER — Other Ambulatory Visit: Payer: Self-pay

## 2024-05-21 ENCOUNTER — Emergency Department (HOSPITAL_COMMUNITY)

## 2024-05-21 ENCOUNTER — Encounter (HOSPITAL_COMMUNITY): Payer: Self-pay | Admitting: *Deleted

## 2024-05-21 DIAGNOSIS — R918 Other nonspecific abnormal finding of lung field: Secondary | ICD-10-CM | POA: Diagnosis not present

## 2024-05-21 DIAGNOSIS — Z87891 Personal history of nicotine dependence: Secondary | ICD-10-CM | POA: Insufficient documentation

## 2024-05-21 DIAGNOSIS — I2510 Atherosclerotic heart disease of native coronary artery without angina pectoris: Secondary | ICD-10-CM | POA: Diagnosis not present

## 2024-05-21 DIAGNOSIS — Z992 Dependence on renal dialysis: Secondary | ICD-10-CM | POA: Insufficient documentation

## 2024-05-21 DIAGNOSIS — J811 Chronic pulmonary edema: Secondary | ICD-10-CM | POA: Diagnosis not present

## 2024-05-21 DIAGNOSIS — E875 Hyperkalemia: Secondary | ICD-10-CM | POA: Diagnosis not present

## 2024-05-21 DIAGNOSIS — D649 Anemia, unspecified: Secondary | ICD-10-CM | POA: Diagnosis not present

## 2024-05-21 DIAGNOSIS — E1122 Type 2 diabetes mellitus with diabetic chronic kidney disease: Secondary | ICD-10-CM | POA: Diagnosis not present

## 2024-05-21 DIAGNOSIS — Z79899 Other long term (current) drug therapy: Secondary | ICD-10-CM | POA: Insufficient documentation

## 2024-05-21 DIAGNOSIS — I120 Hypertensive chronic kidney disease with stage 5 chronic kidney disease or end stage renal disease: Secondary | ICD-10-CM | POA: Insufficient documentation

## 2024-05-21 DIAGNOSIS — Z7982 Long term (current) use of aspirin: Secondary | ICD-10-CM | POA: Insufficient documentation

## 2024-05-21 DIAGNOSIS — Z951 Presence of aortocoronary bypass graft: Secondary | ICD-10-CM | POA: Insufficient documentation

## 2024-05-21 DIAGNOSIS — R0989 Other specified symptoms and signs involving the circulatory and respiratory systems: Secondary | ICD-10-CM

## 2024-05-21 DIAGNOSIS — J9601 Acute respiratory failure with hypoxia: Secondary | ICD-10-CM | POA: Insufficient documentation

## 2024-05-21 DIAGNOSIS — N186 End stage renal disease: Secondary | ICD-10-CM | POA: Diagnosis not present

## 2024-05-21 DIAGNOSIS — J810 Acute pulmonary edema: Secondary | ICD-10-CM | POA: Diagnosis not present

## 2024-05-21 DIAGNOSIS — J4489 Other specified chronic obstructive pulmonary disease: Secondary | ICD-10-CM | POA: Diagnosis not present

## 2024-05-21 DIAGNOSIS — D631 Anemia in chronic kidney disease: Secondary | ICD-10-CM | POA: Diagnosis not present

## 2024-05-21 DIAGNOSIS — Z7951 Long term (current) use of inhaled steroids: Secondary | ICD-10-CM | POA: Diagnosis not present

## 2024-05-21 DIAGNOSIS — R0602 Shortness of breath: Secondary | ICD-10-CM | POA: Diagnosis present

## 2024-05-21 LAB — CBC
HCT: 40.1 % (ref 39.0–52.0)
Hemoglobin: 12.5 g/dL — ABNORMAL LOW (ref 13.0–17.0)
MCH: 28 pg (ref 26.0–34.0)
MCHC: 31.2 g/dL (ref 30.0–36.0)
MCV: 89.7 fL (ref 80.0–100.0)
Platelets: 140 K/uL — ABNORMAL LOW (ref 150–400)
RBC: 4.47 MIL/uL (ref 4.22–5.81)
RDW: 17.4 % — ABNORMAL HIGH (ref 11.5–15.5)
WBC: 7.2 K/uL (ref 4.0–10.5)
nRBC: 0 % (ref 0.0–0.2)

## 2024-05-21 LAB — COMPREHENSIVE METABOLIC PANEL WITH GFR
ALT: 16 U/L (ref 0–44)
AST: 23 U/L (ref 15–41)
Albumin: 4.2 g/dL (ref 3.5–5.0)
Alkaline Phosphatase: 77 U/L (ref 38–126)
Anion gap: 16 — ABNORMAL HIGH (ref 5–15)
BUN: 57 mg/dL — ABNORMAL HIGH (ref 8–23)
CO2: 26 mmol/L (ref 22–32)
Calcium: 9.6 mg/dL (ref 8.9–10.3)
Chloride: 97 mmol/L — ABNORMAL LOW (ref 98–111)
Creatinine, Ser: 11.4 mg/dL — ABNORMAL HIGH (ref 0.61–1.24)
GFR, Estimated: 4 mL/min — ABNORMAL LOW (ref >60)
Glucose, Bld: 103 mg/dL — ABNORMAL HIGH (ref 70–99)
Potassium: 6.5 mmol/L (ref 3.5–5.1)
Sodium: 139 mmol/L (ref 135–145)
Total Bilirubin: 0.6 mg/dL (ref 0.0–1.2)
Total Protein: 8 g/dL (ref 6.5–8.1)

## 2024-05-21 LAB — TROPONIN T, HIGH SENSITIVITY
Troponin T High Sensitivity: 231 ng/L (ref 0–19)
Troponin T High Sensitivity: 236 ng/L (ref 0–19)

## 2024-05-21 LAB — HEPATITIS B SURFACE ANTIGEN: Hepatitis B Surface Ag: NONREACTIVE

## 2024-05-21 LAB — PROTIME-INR
INR: 1.1 (ref 0.8–1.2)
Prothrombin Time: 14.4 s (ref 11.4–15.2)

## 2024-05-21 LAB — CBG MONITORING, ED: Glucose-Capillary: 138 mg/dL — ABNORMAL HIGH (ref 70–99)

## 2024-05-21 LAB — PRO BRAIN NATRIURETIC PEPTIDE: Pro Brain Natriuretic Peptide: >35000 pg/mL — ABNORMAL HIGH (ref <300.0)

## 2024-05-21 MED ORDER — HEPARIN SODIUM (PORCINE) 1000 UNIT/ML IJ SOLN
INTRAMUSCULAR | Status: AC
  Filled 2024-05-21: qty 2

## 2024-05-21 MED ORDER — PENTAFLUOROPROP-TETRAFLUOROETH EX AERO
1.0000 | INHALATION_SPRAY | CUTANEOUS | Status: DC | PRN
  Administered 2024-05-21: 1 via TOPICAL

## 2024-05-21 MED ORDER — LIDOCAINE HCL (PF) 1 % IJ SOLN: 5.0000 mL | INTRAMUSCULAR | Status: DC | PRN

## 2024-05-21 MED ORDER — INSULIN ASPART 100 UNIT/ML IV SOLN
5.0000 [IU] | Freq: Once | INTRAVENOUS | Status: AC
  Administered 2024-05-21: 5 [IU] via INTRAVENOUS
  Filled 2024-05-21: qty 1

## 2024-05-21 MED ORDER — CALCIUM GLUCONATE-NACL 1-0.675 GM/50ML-% IV SOLN
1.0000 g | Freq: Once | INTRAVENOUS | Status: AC
  Administered 2024-05-21: 1000 mg via INTRAVENOUS
  Filled 2024-05-21: qty 50

## 2024-05-21 MED ORDER — HEPARIN SODIUM (PORCINE) 1000 UNIT/ML DIALYSIS: 1000.0000 [IU] | INTRAMUSCULAR | Status: DC | PRN

## 2024-05-21 MED ORDER — HEPARIN SODIUM (PORCINE) 1000 UNIT/ML DIALYSIS
20.0000 [IU]/kg | INTRAMUSCULAR | Status: DC | PRN
Start: 2024-05-21 — End: 2024-05-21
  Administered 2024-05-21: 2000 [IU] via INTRAVENOUS_CENTRAL

## 2024-05-21 MED ORDER — NEPRO/CARBSTEADY PO LIQD: 237.0000 mL | ORAL | Status: DC | PRN

## 2024-05-21 MED ORDER — CHLORHEXIDINE GLUCONATE CLOTH 2 % EX PADS: 6.0000 | MEDICATED_PAD | Freq: Every day | CUTANEOUS | Status: DC

## 2024-05-21 MED ORDER — ANTICOAGULANT SODIUM CITRATE 4% (200MG/5ML) IV SOLN: 5.0000 mL | Status: DC | PRN

## 2024-05-21 MED ORDER — ALTEPLASE 2 MG IJ SOLR: 2.0000 mg | Freq: Once | INTRAMUSCULAR | Status: DC | PRN

## 2024-05-21 MED ORDER — SODIUM ZIRCONIUM CYCLOSILICATE 5 G PO PACK
10.0000 g | PACK | Freq: Once | ORAL | Status: AC
  Administered 2024-05-21: 10 g via ORAL
  Filled 2024-05-21: qty 2

## 2024-05-21 MED ORDER — SODIUM BICARBONATE 8.4 % IV SOLN
50.0000 meq | Freq: Once | INTRAVENOUS | Status: AC
  Administered 2024-05-21: 50 meq via INTRAVENOUS
  Filled 2024-05-21: qty 50

## 2024-05-21 MED ORDER — ALBUTEROL SULFATE (2.5 MG/3ML) 0.083% IN NEBU
2.5000 mg | INHALATION_SOLUTION | Freq: Once | RESPIRATORY_TRACT | Status: AC
  Administered 2024-05-21: 2.5 mg via RESPIRATORY_TRACT
  Filled 2024-05-21: qty 3

## 2024-05-21 MED ORDER — LIDOCAINE-PRILOCAINE 2.5-2.5 % EX CREA: 1.0000 | TOPICAL_CREAM | CUTANEOUS | Status: DC | PRN

## 2024-05-21 MED ORDER — DEXTROSE 50 % IV SOLN
1.0000 | Freq: Once | INTRAVENOUS | Status: AC
  Administered 2024-05-21: 50 mL via INTRAVENOUS
  Filled 2024-05-21: qty 50

## 2024-05-21 NOTE — ED Triage Notes (Signed)
 Pt states he received his covid vaccine yesterday and last night he started having shaking and sob; pt went to dialysis this am but they told him to come to ED due to his sob and shaking and would not dialyze him  Pt denies any pain

## 2024-05-21 NOTE — Consult Note (Signed)
 [facility] Renal Consultation Note    Indication for Consultation:  Management of ESRD/hemodialysis; anemia, hypertension/volume and secondary hyperparathyroidism  HPI: [name] [name] is a 73 y.o. male with an extensive past medical history significant for DM, HTN, CAD, COPD, cirrhosis, obesity, OSA not on CPAP, HLD, hypothyroidism, PUD, and ESRD on HD MWF at [facility] who went to HD today but reported increased SOB, chills, myalgias, and malaise and wanted to go to the hospital.  In the ED, Temp 99.5, Bp 133/73, HR 67, SpO2 94 % on 3 liters [place].  Labs were notable for WBC 7.2, plt 140, K 6.,5, pro-BNP >35,000, troponin T 231.  CXR with evidence of pulmonary edema.  We were consulted to provide dialysis for his volume overload and hyperkalemia.  Of note, he did receive a covid vaccine yesterday and his symptoms started afterwards.  He denies any recent sick contacts or exposure to covid.  Past Medical History:  Diagnosis Date   Anemia    Arthritis    Asthma    Bell palsy    left side of face- comes and goes per pt on 11/12/23   CAD (coronary artery disease)    a. 2014 MV: abnl w/ infap ischemia; b. 03/2013 Cath: aneurysmal bleb in the LAD w/ otw nonobs dzs-->Med Rx.   Chronic back pain    Chronic kidney disease    on dialysis - mon wed fri   Chronic knee pain    a. 09/2015 s/p R TKA.   Chronic pain    Chronic shoulder pain    Chronic sinusitis    COPD (chronic obstructive pulmonary disease) (HCC)    Diabetes mellitus without complication (HCC)    type II , no meds, does not check blood sugar   Dyspnea    with exertion   Dysrhythmia    2nd degree AV block   ESRD on peritoneal dialysis (HCC)    on peritoneal dialysis, [facility] - mon wed fri   Essential hypertension    GERD (gastroesophageal reflux disease)    Gout    Hepatomegaly    noted on noncontrast CT 2015   History of hiatal hernia    Hyperlipidemia    Hypothyroidism    Lateral meniscus tear     Myocardial infarction (HCC) 07/2021   Neuromuscular disorder (HCC)    Obesity    Truncal   Obstructive sleep apnea    does not use cpap    On home oxygen  therapy    uses 2L when needed   Pneumonia    PUD (peptic ulcer disease)    remote, reports f/u EGD about 8 years ago unremarkable    Reactive airway disease    related to exposure to chemical during 9/11   Sinusitis    Substance abuse (HCC)    hx in past   Vitamin D  deficiency    Past Surgical History:  Procedure Laterality Date   A/V FISTULAGRAM N/A 02/28/2023   Procedure: A/V Fistulagram;  Surgeon: Lanis Fonda BRAVO, MD;  Location: [facility] INVASIVE CV LAB;  Service: Cardiovascular;  Laterality: N/A;   A/V FISTULAGRAM N/A 10/22/2023   Procedure: A/V Fistulagram;  Surgeon: Sheree Penne Bruckner, MD;  Location: [facility] INVASIVE CV LAB;  Service: Cardiovascular;  Laterality: N/A;   ASAD LT SHOULDER  12/15/2008   left shoulder   AV FISTULA PLACEMENT Left 08/09/2016   Procedure: BRACHIOCEPHALIC ARTERIOVENOUS (AV) FISTULA CREATION LEFT ARM;  Surgeon: Carlin BRAVO Haddock, MD;  Location: [facility] OR;  Service:  Vascular;  Laterality: Left;   BASCILIC VEIN TRANSPOSITION Left 11/13/2023   Procedure: TRANSPOSITION, VEIN, BASILIC;  Surgeon: Sheree Penne Bruckner, MD;  Location: [facility] OR;  Service: Vascular;  Laterality: Left;   CAPD INSERTION N/A 10/07/2018   Procedure: LAPAROSCOPIC PERITONEAL CATHETER PLACEMENT;  Surgeon: Stevie Herlene Righter, MD;  Location: [facility] ORS;  Service: General;  Laterality: N/A;   CATARACT EXTRACTION W/PHACO Left 03/28/2016   Procedure: CATARACT EXTRACTION PHACO AND INTRAOCULAR LENS PLACEMENT LEFT EYE;  Surgeon: Oneil Platts, MD;  Location: [facility] ORS;  Service: Ophthalmology;  Laterality: Left;  CDE: 4.77   CATARACT EXTRACTION W/PHACO Right 04/11/2016   Procedure: CATARACT EXTRACTION PHACO AND INTRAOCULAR LENS PLACEMENT RIGHT EYE; CDE:  4.74;  Surgeon: Oneil Platts, MD;  Location: [facility] ORS;  Service: Ophthalmology;  Laterality: Right;    COLONOSCOPY  10/15/2008   Fields: Rectal polyp obliterated, not retrieved, hemorrhoids, single ascending colon diverticulum near the CV. Next colonoscopy April 2020   COLONOSCOPY N/A 12/25/2014   SLF: 1. Colorectal polyps (2) removed 2. Small internal hemorrhoids 3. the left colon is severely redundant. hyperplastic polyps   CORONARY ARTERY BYPASS GRAFT N/A 06/05/2022   Procedure: OFF PUMP CORONARY ARTERY BYPASS GRAFTING (CABG) X 2 BYPASSES USING LEFT INTERNAL MAMMARY ARTERY AND RIGHT LEG GREATER SAPHENOUS VEIN HARVESTED ENDOSCOPICALLY;  Surgeon: Shyrl Linnie KIDD, MD;  Location: [facility] OR;  Service: Open Heart Surgery;  Laterality: N/A;   CORONARY STENT INTERVENTION N/A 07/25/2021   Procedure: CORONARY STENT INTERVENTION;  Surgeon: Wonda Sharper, MD;  Location: [facility] INVASIVE CV LAB;  Service: Cardiovascular;  Laterality: N/A;   CORONARY STENT INTERVENTION N/A 12/26/2021   Procedure: CORONARY STENT INTERVENTION;  Surgeon: Mady Bruckner, MD;  Location: [facility] INVASIVE CV LAB;  Service: Cardiovascular;  Laterality: N/A;   CORONARY STENT INTERVENTION N/A 01/20/2022   Procedure: CORONARY STENT INTERVENTION;  Surgeon: Wonda Sharper, MD;  Location: [facility] INVASIVE CV LAB;  Service: Cardiovascular;  Laterality: N/A;   DOPPLER ECHOCARDIOGRAPHY     ESOPHAGOGASTRODUODENOSCOPY N/A 12/25/2014   SLF: 1. Anemia most likely due to CRI, gastritis, gastric polyps 2. Moderate non-erosive gastriits and mild duodenitis.  3.TWo large gstric polyps removed.    EYE SURGERY  12/22/2010   tear duct probing-[place]   FOREIGN BODY REMOVAL  03/29/2011   Procedure: REMOVAL FOREIGN BODY EXTREMITY;  Surgeon: Taft Minerva, MD;  Location: [facility]S;  Service: Orthopedics;  Laterality: Right;  Removal Foreign Body Right Thumb   INSERTION OF DIALYSIS CATHETER N/A 11/13/2023   Procedure: INSERTION OF DIALYSIS CATHETER;  Surgeon: Sheree Penne Bruckner, MD;  Location: [facility] OR;  Service: Vascular;  Laterality: N/A;   IR FLUORO  GUIDE CV LINE RIGHT  08/06/2018   IR FLUORO GUIDE CV LINE RIGHT  02/17/2023   IR US  GUIDE VASC ACCESS RIGHT  08/06/2018   IR US  GUIDE VASC ACCESS RIGHT  02/17/2023   KNEE ARTHROSCOPY  10/16/2007   left   KNEE ARTHROSCOPY WITH LATERAL MENISECTOMY Right 10/14/2015   Procedure: LEFT KNEE ARTHROSCOPY WITH PARTIAL LATERAL MENISECTOMY;  Surgeon: Taft FORBES Minerva, MD;  Location: [facility] ORS;  Service: Orthopedics;  Laterality: Right;   LEFT HEART CATH AND CORONARY ANGIOGRAPHY N/A 07/25/2021   Procedure: LEFT HEART CATH AND CORONARY ANGIOGRAPHY;  Surgeon: Wonda Sharper, MD;  Location: [facility] INVASIVE CV LAB;  Service: Cardiovascular;  Laterality: N/A;   LEFT HEART CATH AND CORONARY ANGIOGRAPHY N/A 12/26/2021   Procedure: LEFT HEART CATH AND CORONARY ANGIOGRAPHY;  Surgeon: Mady Bruckner, MD;  Location: [facility] INVASIVE CV LAB;  Service: Cardiovascular;  Laterality: N/A;   LEFT HEART CATH AND CORONARY ANGIOGRAPHY N/A 01/20/2022   Procedure: LEFT HEART CATH AND CORONARY ANGIOGRAPHY;  Surgeon: Wonda Sharper, MD;  Location: [facility] INVASIVE CV LAB;  Service: Cardiovascular;  Laterality: N/A;   LEFT HEART CATHETERIZATION WITH CORONARY ANGIOGRAM N/A 03/28/2013   Procedure: LEFT HEART CATHETERIZATION WITH CORONARY ANGIOGRAM;  Surgeon: Alm LELON Clay, MD;  Location: [facility] CATH LAB;  Service: Cardiovascular;  Laterality: N/A;   NM MYOVIEW  LTD     PENILE PROSTHESIS IMPLANT N/A 08/16/2015   Procedure: PENILE PROTHESIS INFLATABLE, three piece, Excisional biopsy of Penile ulcer, Penile molding;  Surgeon: Arlena Gal, MD;  Location: [facility] ORS;  Service: Urology;  Laterality: N/A;   PENILE PROSTHESIS IMPLANT N/A 12/24/2017   Procedure: REMOVAL AND  REPLACEMENT  COLOPLAST PENILE PROSTHESIS;  Surgeon: Carolee Sherwood JONETTA DOUGLAS, MD;  Location: [facility] ORS;  Service: Urology;  Laterality: N/A;   PERIPHERAL VASCULAR BALLOON ANGIOPLASTY  02/28/2023   Procedure: PERIPHERAL VASCULAR BALLOON ANGIOPLASTY;  Surgeon: Lanis Fonda BRAVO, MD;  Location:  [facility] INVASIVE CV LAB;  Service: Cardiovascular;;   PORT-A-CATH REMOVAL N/A 02/19/2023   Procedure: REMOVAL OF PERITONEAL DIALYSIS CATHETER;  Surgeon: Lanis Fonda BRAVO, MD;  Location: [facility] OR;  Service: Vascular;  Laterality: N/A;   QUADRICEPS TENDON REPAIR  07/21/2011   Procedure: REPAIR QUADRICEP TENDON;  Surgeon: Taft Minerva, MD;  Location: [facility]S;  Service: Orthopedics;  Laterality: Right;   REVISON OF ARTERIOVENOUS FISTULA Left 11/13/2023   Procedure: REVISON OF ARTERIOVENOUS FISTULA;  Surgeon: Sheree Penne Bruckner, MD;  Location: [facility] OR;  Service: Vascular;  Laterality: Left;   RIGHT/LEFT HEART CATH AND CORONARY ANGIOGRAPHY N/A 05/30/2022   Procedure: RIGHT/LEFT HEART CATH AND CORONARY ANGIOGRAPHY;  Surgeon: Dann Candyce RAMAN, MD;  Location: [facility] INVASIVE CV LAB;  Service: Cardiovascular;  Laterality: N/A;   TEE WITHOUT CARDIOVERSION N/A 06/05/2022   Procedure: TRANSESOPHAGEAL ECHOCARDIOGRAM (TEE);  Surgeon: Shyrl Linnie KIDD, MD;  Location: [facility] OR;  Service: Open Heart Surgery;  Laterality: N/A;   TOENAIL EXCISION     removed x2-bilateral   UMBILICAL HERNIA REPAIR  07/17/2005   [place]   Family History:   Family History  Problem Relation Age of Onset   Hypertension Mother        MI   Cancer Mother        breast    Diabetes Mother    Diabetes Father    Hypertension Father    Hypertension Sister    Diabetes Sister    Arthritis Other    Asthma Other    Lung disease Other    Anesthesia problems Neg Hx    Hypotension Neg Hx    Malignant hyperthermia Neg Hx    Pseudochol deficiency Neg Hx    Colon cancer Neg Hx    Social History:  reports that he quit smoking about 14 years ago. His smoking use included cigarettes. He started smoking about 39 years ago. He has a 25 pack-year smoking history. He has never used smokeless tobacco. He reports current alcohol  use. He reports current drug use. Drug: Marijuana. Allergies  Allergen Reactions   Tramadol  Itching   Opana  [Oxymorphone Hcl] Itching   Prior to Admission medications   Medication Sig Start Date End Date Taking? Authorizing Provider  acetaminophen  (TYLENOL ) 650 MG CR tablet Take 1,300 mg by mouth every 8 (eight) hours as needed for pain.    [provider]  albuterol  (VENTOLIN  HFA) 108 (90 Base) MCG/ACT inhaler Inhale 2 puffs into the lungs every  6 (six) hours as needed for wheezing or shortness of breath. 08/26/23   Pearlean Manus, MD  amLODipine  (NORVASC ) 5 MG tablet Take 1 tablet (5 mg total) by mouth daily. 08/26/23   Pearlean Manus, MD  ascorbic acid  (VITAMIN C ) 500 MG tablet Take 1 tablet (500 mg total) by mouth daily. 05/07/23   Setzer, Nena PARAS, PA-C  aspirin  EC 81 MG tablet Take 81 mg by mouth daily. Swallow whole.    [provider]  atorvastatin  (LIPITOR ) 80 MG tablet Take 1 tablet (80 mg total) by mouth daily. 06/20/22   Dwan Kyla HERO, PA-C  carvedilol  (COREG ) 6.25 MG tablet Take 6.25 mg by mouth 2 (two) times daily. 03/27/24   [provider]  divalproex  (DEPAKOTE ) 250 MG DR tablet Take by mouth. 03/27/24   [provider]  esomeprazole  (NEXIUM ) 40 MG capsule Take 1 capsule (40 mg total) by mouth 2 (two) times daily before a meal. 07/16/23   Shirlean Therisa ORN, NP  ferric citrate (AURYXIA) 1 GM 210 MG(Fe) tablet Take 420 mg by mouth 3 (three) times daily with meals.    [provider]  fluticasone  furoate-vilanterol (BREO ELLIPTA ) 200-25 MCG/ACT AEPB Inhale into the lungs. Patient not taking: Reported on 04/10/2024 03/27/24   [provider]  Fluticasone -Umeclidin-Vilant (TRELEGY ELLIPTA ) 200-62.5-25 MCG/ACT AEPB Inhale 1 puff into the lungs daily. 11/22/23   Hope Almarie ORN, NP  midodrine  (PROAMATINE ) 10 MG tablet Take 10 mg by mouth 3 (three) times a week. Pablo [place], and Friday 12/26/23   [provider]  OVER THE COUNTER MEDICATION Take 2 tablets by mouth daily at 6 (six) AM.    [provider]  tamsulosin (FLOMAX) 0.4  MG CAPS capsule Take 0.4 mg by mouth daily after supper. 09/17/23   [provider]  tiZANidine  (ZANAFLEX ) 4 MG tablet Take 1 tablet (4 mg total) by mouth daily. 03/31/24 03/31/25  Margrette Taft BRAVO, MD  traZODone  (DESYREL ) 50 MG tablet Take 50 mg by mouth at bedtime. 03/27/24   [provider]   Current Facility-Administered Medications  Medication Dose Route Frequency Provider Last Rate Last Admin   calcium  gluconate 1 g/ 50 mL sodium chloride  IVPB  1 g Intravenous Once Garrick Charleston, MD 150 mL/hr at 05/21/24 0932 1,000 mg at 05/21/24 0932   Current Outpatient Medications  Medication Sig Dispense Refill   acetaminophen  (TYLENOL ) 650 MG CR tablet Take 1,300 mg by mouth every 8 (eight) hours as needed for pain.     albuterol  (VENTOLIN  HFA) 108 (90 Base) MCG/ACT inhaler Inhale 2 puffs into the lungs every 6 (six) hours as needed for wheezing or shortness of breath. 18 g 11   amLODipine  (NORVASC ) 5 MG tablet Take 1 tablet (5 mg total) by mouth daily. 30 tablet 5   ascorbic acid  (VITAMIN C ) 500 MG tablet Take 1 tablet (500 mg total) by mouth daily. 100 tablet 0   aspirin  EC 81 MG tablet Take 81 mg by mouth daily. Swallow whole.     atorvastatin  (LIPITOR ) 80 MG tablet Take 1 tablet (80 mg total) by mouth daily. 30 tablet 1   carvedilol  (COREG ) 6.25 MG tablet Take 6.25 mg by mouth 2 (two) times daily.     divalproex  (DEPAKOTE ) 250 MG DR tablet Take by mouth.     esomeprazole  (NEXIUM ) 40 MG capsule Take 1 capsule (40 mg total) by mouth 2 (two) times daily before a meal. 180 capsule 0   ferric citrate (AURYXIA) 1 GM 210 MG(Fe) tablet  Take 420 mg by mouth 3 (three) times daily with meals.     fluticasone  furoate-vilanterol (BREO ELLIPTA ) 200-25 MCG/ACT AEPB Inhale into the lungs. (Patient not taking: Reported on 04/10/2024)     Fluticasone -Umeclidin-Vilant (TRELEGY ELLIPTA ) 200-62.5-25 MCG/ACT AEPB Inhale 1 puff into the lungs daily. 28 each 5   midodrine  (PROAMATINE ) 10 MG tablet Take  10 mg by mouth 3 (three) times a week. Mon, Wed, and Friday     OVER THE COUNTER MEDICATION Take 2 tablets by mouth daily at 6 (six) AM.     tamsulosin (FLOMAX) 0.4 MG CAPS capsule Take 0.4 mg by mouth daily after supper.     tiZANidine  (ZANAFLEX ) 4 MG tablet Take 1 tablet (4 mg total) by mouth daily. 30 tablet 1   traZODone  (DESYREL ) 50 MG tablet Take 50 mg by mouth at bedtime.     Labs: Basic Metabolic Panel: Recent Labs  Lab 05/21/24 0811  NA 139  K 6.5*  CL 97*  CO2 26  GLUCOSE 103*  BUN 57*  CREATININE 11.40*  CALCIUM  9.6   Liver Function Tests: Recent Labs  Lab 05/21/24 0811  AST 23  ALT 16  ALKPHOS 77  BILITOT 0.6  PROT 8.0  ALBUMIN  4.2   No results for input(s): LIPASE, AMYLASE in the last 168 hours. No results for input(s): AMMONIA in the last 168 hours. CBC: Recent Labs  Lab 05/21/24 0652  WBC 7.2  HGB 12.5*  HCT 40.1  MCV 89.7  PLT 140*   Cardiac Enzymes: No results for input(s): CKTOTAL, CKMB, CKMBINDEX, TROPONINI in the last 168 hours. CBG: No results for input(s): GLUCAP in the last 168 hours. Iron Studies: No results for input(s): IRON, TIBC, TRANSFERRIN, FERRITIN in the last 72 hours. Studies/Results: DG Chest Port 1 View Result Date: 05/21/2024 EXAM: 1 VIEW XRAY OF THE CHEST 05/21/2024 07:04:00 AM COMPARISON: Chest radiographs 11/19/2023 and earlier. CLINICAL HISTORY: 73 year old male. SOB. FINDINGS: LINES, TUBES AND DEVICES: Right chest dual lumen dialysis type catheter has been removed. LUNGS AND PLEURA: Low lung volumes and increased lordotic positioning of the chest. Bilateral pulmonary vascularity appears symmetrically increased. Patchy lung base opacity most resembles atelectasis. No pulmonary edema. No pleural effusion. No pneumothorax. HEART AND MEDIASTINUM: Stable sequelae of CABG and median sternotomy. BONES AND SOFT TISSUES: No acute osseous abnormality. IMPRESSION: 1. Symmetric increased pulmonary interstitium /  vascularity. Consider mild or developing pulmonary edema. Viral / atypical respiratory infection felt less likely. 2. Low lung volumes, Patchy lung base opacity most resembles atelectasis. Electronically signed by: Helayne Hurst MD 05/21/2024 07:10 AM EST RP Workstation: [id number]    ROS: Pertinent items are noted in HPI. Physical Exam: Vitals:   05/21/24 0638 05/21/24 0640 05/21/24 0645 05/21/24 0912  BP:  133/73 133/73   Pulse:  76 67   Resp:  20 16   Temp:  99.5 F (37.5 C)    TempSrc:  Oral    SpO2:  (!) 84% 94% 95%  Weight: 97.5 kg     Height: 5' 4 (1.626 m)         Weight change:  No intake or output data in the 24 hours ending 05/21/24 0946 BP 133/73   Pulse 67   Temp 99.5 F (37.5 C) (Oral)   Resp 16   Ht 5' 4 (1.626 m)   Wt 97.5 kg   SpO2 95%   BMI 36.90 kg/m  General appearance: alert, cooperative, no distress, and shaking Head: Normocephalic, without obvious abnormality, atraumatic Resp:  clear to auscultation bilaterally Cardio: regular rate and rhythm, S1, S2 normal, no murmur, click, rub or gallop GI: soft, non-tender; bowel sounds normal; no masses,  no organomegaly Extremities: extremities normal, atraumatic, no cyanosis or edema and LUE AVF +T/B Dialysis Access:  Dialysis Orders: Center: [facility]  on MWF. EDW 97.5kg HD Bath 2K/2.5 Ca  Time 3:45 Heparin  1000 units bolus then 500 units/hr. Access LUE AVF BFR 400 DFR 600    Calcitriol  2 mcg MWF Sensipar 30 mg MWF Assessment/Plan:  Acute hypoxic respiratory failure - CXR with pulmonary edema.  Will plan for HD today and UF as tolerated and re-evaluate his oxygen  requirements.  Chills/myalgias/malaise -  possibly related to covid vaccine.  Workup per primary svc  ESRD -  Plan for HD to keep on MWF schedule  Hyperkalemia - treated medically in the ED.  Will plan for HD with low K bath and follow.  Hypertension/volume  - as above, plan for HD with UF today.  Anemia  - Hgb 12, no ESA  Metabolic bone  disease -   resume home meds  Nutrition - renal diet, carb modified.  Fairy RONAL Sellar, MD [facility], [facility] 05/21/2024, 9:46 AM

## 2024-05-21 NOTE — ED Provider Notes (Signed)
 [place] [facility]MENT AT [facility] Provider Note   CSN: [id number] Arrival date & time: 05/21/24  0630     Patient presents with: Shortness of Breath   [name] [name] is a 73 y.o. male.   HPI Patient presents with his wife who assists with the history. Patient notes that he now feels much better after starting on oxygen . Patient went to dialysis today and was sent here due to shaking, chills. He notes that since receiving his COVID-vaccine yesterday, he has felt unwell, shaking, chills, without objective fever.  With worsening shortness of breath, he still went to dialysis, but there who was found to have chills and shaking, and was sent here. No chest pain currently, no feverishness currently, chills have resolved.     Prior to Admission medications   Medication Sig Start Date End Date Taking? Authorizing Provider  acetaminophen  (TYLENOL ) 650 MG CR tablet Take 1,300 mg by mouth every 8 (eight) hours as needed for pain.    [provider]  albuterol  (VENTOLIN  HFA) 108 (90 Base) MCG/ACT inhaler Inhale 2 puffs into the lungs every 6 (six) hours as needed for wheezing or shortness of breath. 08/26/23   Pearlean Manus, MD  amLODipine  (NORVASC ) 5 MG tablet Take 1 tablet (5 mg total) by mouth daily. 08/26/23   Pearlean Manus, MD  ascorbic acid  (VITAMIN C ) 500 MG tablet Take 1 tablet (500 mg total) by mouth daily. 05/07/23   Setzer, Nena PARAS, PA-C  aspirin  EC 81 MG tablet Take 81 mg by mouth daily. Swallow whole.    [provider]  atorvastatin  (LIPITOR ) 80 MG tablet Take 1 tablet (80 mg total) by mouth daily. 06/20/22   Dwan Kyla HERO, PA-C  carvedilol  (COREG ) 6.25 MG tablet Take 6.25 mg by mouth 2 (two) times daily. 03/27/24   [provider]  divalproex  (DEPAKOTE ) 250 MG DR tablet Take by mouth. 03/27/24   [provider]  esomeprazole  (NEXIUM ) 40 MG capsule Take 1 capsule (40 mg total) by mouth 2 (two) times daily before a  meal. 07/16/23   Shirlean Therisa LELON, NP  ferric citrate (AURYXIA) 1 GM 210 MG(Fe) tablet Take 420 mg by mouth 3 (three) times daily with meals.    [provider]  fluticasone  furoate-vilanterol (BREO ELLIPTA ) 200-25 MCG/ACT AEPB Inhale into the lungs. Patient not taking: Reported on 04/10/2024 03/27/24   [provider]  Fluticasone -Umeclidin-Vilant (TRELEGY ELLIPTA ) 200-62.5-25 MCG/ACT AEPB Inhale 1 puff into the lungs daily. 11/22/23   Hope Almarie LELON, NP  midodrine  (PROAMATINE ) 10 MG tablet Take 10 mg by mouth 3 (three) times a week. Pablo [place], and Friday 12/26/23   [provider]  OVER THE COUNTER MEDICATION Take 2 tablets by mouth daily at 6 (six) AM.    [provider]  tamsulosin (FLOMAX) 0.4 MG CAPS capsule Take 0.4 mg by mouth daily after supper. 09/17/23   [provider]  tiZANidine  (ZANAFLEX ) 4 MG tablet Take 1 tablet (4 mg total) by mouth daily. 03/31/24 03/31/25  Margrette Taft BRAVO, MD  traZODone  (DESYREL ) 50 MG tablet Take 50 mg by mouth at bedtime. 03/27/24   [provider]    Allergies: Tramadol  and Opana [oxymorphone hcl]    Review of Systems  Updated Vital Signs BP 133/73   Pulse 67   Temp 99.5 F (37.5 C) (Oral)   Resp 16   Ht 1.626 m (5' 4)   Wt 97.5 kg   SpO2 95%   BMI 36.90 kg/m  Physical Exam Vitals and nursing note reviewed.  Constitutional:      General: He is not in acute distress.    Appearance: He is well-developed.  HENT:     Head: Normocephalic and atraumatic.  Eyes:     Conjunctiva/sclera: Conjunctivae normal.  Cardiovascular:     Rate and Rhythm: Normal rate and regular rhythm.  Pulmonary:     Effort: Pulmonary effort is normal. No respiratory distress.     Breath sounds: No stridor.  Abdominal:     General: There is no distension.  Skin:    General: Skin is warm and dry.     Comments: Left upper arm dialysis access unremarkable  Neurological:     Mental Status: He is alert and oriented to  person, place, and time.     (all labs ordered are listed, but only abnormal results are displayed) Labs Reviewed  CBC - Abnormal; Notable for the following components:      Result Value   Hemoglobin 12.5 (*)    RDW 17.4 (*)    Platelets 140 (*)    All other components within normal limits  COMPREHENSIVE METABOLIC PANEL WITH GFR - Abnormal; Notable for the following components:   Potassium 6.5 (*)    Chloride 97 (*)    Glucose, Bld 103 (*)    BUN 57 (*)    Creatinine, Ser 11.40 (*)    GFR, Estimated 4 (*)    Anion gap 16 (*)    All other components within normal limits  PRO BRAIN NATRIURETIC PEPTIDE - Abnormal; Notable for the following components:   Pro Brain Natriuretic Peptide >35,000.0 (*)    All other components within normal limits  TROPONIN T, HIGH SENSITIVITY - Abnormal; Notable for the following components:   Troponin T High Sensitivity 231 (*)    All other components within normal limits  PROTIME-INR  TROPONIN T, HIGH SENSITIVITY    EKG: EKG Interpretation Date/Time:  Wednesday May 21 2024 06:48:50 EST Ventricular Rate:  71 PR Interval:    QRS Duration:  129 QT Interval:  383 QTC Calculation: 417 R Axis:   -32  Text Interpretation: Sinus rhythm Mobitz II 2-degree AV block Nonspecific intraventricular conduction delay Artifact Abnormal ECG Confirmed by Garrick [place] [id number]) on 05/21/2024 7:15:59 AM  Radiology: [facility] Chest Port 1 View Result Date: 05/21/2024 EXAM: 1 VIEW XRAY OF THE CHEST 05/21/2024 07:04:00 AM COMPARISON: Chest radiographs 11/19/2023 and earlier. CLINICAL HISTORY: 73 year old male. SOB. FINDINGS: LINES, TUBES AND DEVICES: Right chest dual lumen dialysis type catheter has been removed. LUNGS AND PLEURA: Low lung volumes and increased lordotic positioning of the chest. Bilateral pulmonary vascularity appears symmetrically increased. Patchy lung base opacity most resembles atelectasis. No pulmonary edema. No pleural effusion. No pneumothorax.  HEART AND MEDIASTINUM: Stable sequelae of CABG and median sternotomy. BONES AND SOFT TISSUES: No acute osseous abnormality. IMPRESSION: 1. Symmetric increased pulmonary interstitium / vascularity. Consider mild or developing pulmonary edema. Viral / atypical respiratory infection felt less likely. 2. Low lung volumes, Patchy lung base opacity most resembles atelectasis. Electronically signed by: Helayne Hurst MD 05/21/2024 07:10 AM EST RP Workstation: [id number]     Procedures   Medications Ordered in the ED  calcium  gluconate 1 g/ 50 mL sodium chloride  IVPB (1,000 mg Intravenous New Bag/Given 05/21/24 0932)  sodium zirconium cyclosilicate  (LOKELMA ) packet 10 g (10 g Oral Given 05/21/24 0926)  albuterol  (PROVENTIL ) (2.5 MG/3ML) 0.083% nebulizer solution 2.5 mg (2.5 mg Nebulization Given 05/21/24 0912)  insulin  aspart (  novoLOG ) injection 5 Units (5 Units Intravenous Given 05/21/24 0922)    And  dextrose  50 % solution 50 mL (50 mLs Intravenous Given 05/21/24 0927)  sodium bicarbonate injection 50 mEq (50 mEq Intravenous Given 05/21/24 0929)    Clinical Course as of 05/21/24 0933  Wed May 21, 2024  0706 [facility] Chest [place] 1 View [EA]    Clinical Course User Index [EA] Rosina Almarie LABOR, PA-C                                 Medical Decision Making Adult male with end-stage renal disease presents from dialysis with concern for fever, chills.  Given temporal proximity to receiving COVID-vaccine some suspicion for inflammatory response, though with his history differential including electrolyte disturbance, pneumonia, heart failure all considered. X-ray consistent with pulmonary congestion the patient has new oxygen  requirement, requiring 3 L via nasal cannula for saturation 95% which is abnormal. Cardiac 65 sinus normal, EKG nonischemic, labs largely noncontributory, case discussed withNephrology to facilitate dialysis here.  Patient may be appropriate for discharge following that   Amount and/or  Complexity of Data Reviewed Independent Historian: spouse External Data Reviewed: notes. Labs:  Decision-making details documented in ED Course. Radiology: independent interpretation performed. Decision-making details documented in ED Course. ECG/medicine tests: independent interpretation performed. Decision-making details documented in ED Course.  Risk OTC drugs. Prescription drug management. Decision regarding hospitalization. Diagnosis or treatment significantly limited by social determinants of health.   9:33 AM Patient's labs notable for hyperkalemia, and troponin elevation, which may be secondary to his increased work of breathing, in the context of negligible renal function.  With hyperkalemia patient received Lokelma , albuterol , insulin , dextrose , calcium . I discussed this case on nephrology, Dr. Rayburn. Patient will require dialysis from the emergency department.  Patient's other initial findings are generally reassuring, he is awake, alert, stating that he feels better now on supplemental oxygen .   CRITICAL CARE Performed by: Lamar Salen Total critical care time: 35 minutes Critical care time was exclusive of separately billable procedures and treating other patients. Critical care was necessary to treat or prevent imminent or life-threatening deterioration. Critical care was time spent personally by me on the following activities: development of treatment plan with patient and/or surrogate as well as nursing, discussions with consultants, evaluation of patient's response to treatment, examination of patient, obtaining history from patient or surrogate, ordering and performing treatments and interventions, ordering and review of laboratory studies, ordering and review of radiographic studies, pulse oximetry and re-evaluation of patient's condition.   Final diagnoses:  Pulmonary vascular congestion  Hyperkalemia    ED Discharge Orders     None           Salen Lamar, MD 05/21/24 [phone]

## 2024-05-21 NOTE — Procedures (Signed)
 Received patient in bed to unit.  Alert and oriented.  Informed consent signed and in chart.  All procedures explained. LUA AVF cannulated with 15g needles x 2 per policy, without difficulty. Tx initiated per MD order. Pt states he only wants to run 2 hours tonight, will notify MD. Dr Tobie notified of Pt request to shorten tx. No orders received  TX duration:2 hours Pt signed off AMA after 2 hours. Dr Tobie aware. Blood returned. Needles removed, sites held x 2 until hemostasis achieved. Gauze changed prior to taping  Patient tolerated well.  Transported back to the room  Alert, without acute distress.  Hand-off given to patient's nurse.   Access used: LUA AVF Access issues: none  Total UF removed: 1800 ml. Medication(s) given: See MAR   [facility]ell LITTIE [facility]

## 2024-05-21 NOTE — Progress Notes (Signed)
 Pt receives out-pt HD at [facility], MWF [phone] chair time, conatcted clinic to inform of arrival, they confirmed he did not receive tx there this morning. Last tx Monday at clinic, will continue to assisst as needed.   Lavanda Salvator [place] Dialysis navigator [phone] [facility]#- [id number]

## 2024-05-21 NOTE — ED Provider Notes (Signed)
  Physical Exam  BP 134/75   Pulse (!) 56   Temp 98.9 F (37.2 C) (Oral)   Resp (!) 24   Ht 5' 4 (1.626 m)   Wt 97.5 kg   SpO2 95%   BMI 36.90 kg/m   Physical Exam  Procedures  Procedures  ED Course / MDM   Clinical Course as of 05/21/24 1557  Wed May 21, 2024  0706 [facility] Chest [place] 1 View [EA]  1556 Assumed care from Dr Garrick. 73 yo M with ESRD on HD who presented with volume overload. Had covid shot yesterday and felt sub fevers but CXR looks ok. Getting IHD then likely can go home after.  [RP]    Clinical Course User Index [EA] Rosina Almarie LABOR, PA-C [RP] Yolande Lamar BROCKS, MD   Medical Decision Making Risk OTC drugs. Prescription drug management. Decision regarding hospitalization.   ***

## 2024-05-21 NOTE — Discharge Instructions (Addendum)
 You were seen for your elevated potassium in the emergency department.   Follow-up with your primary doctor in 2-3 days regarding your visit.  Go to dialysis tomorrow.  Return immediately to the emergency department if you experience any of the following: Difficulty breathing, or any other concerning symptoms.    Thank you for visiting our Emergency Department. It was a pleasure taking care of you today.

## 2024-05-21 NOTE — ED Notes (Signed)
 Light green tube pulled off pts IV was hemolyzed. Phlebotomy called to stick pt for lab.

## 2024-05-21 NOTE — ED Notes (Signed)
 Pt initial O2 sat 84%; pt placed on 3L of O2 via [place] and O2 sats increased to 94%

## 2024-05-22 DIAGNOSIS — J9601 Acute respiratory failure with hypoxia: Secondary | ICD-10-CM | POA: Diagnosis not present

## 2024-05-22 LAB — I-STAT CHEM 8, ED
BUN: 53 mg/dL — ABNORMAL HIGH (ref 8–23)
Calcium, Ion: 1.05 mmol/L — ABNORMAL LOW (ref 1.15–1.40)
Chloride: 100 mmol/L (ref 98–111)
Creatinine, Ser: 10.1 mg/dL — ABNORMAL HIGH (ref 0.61–1.24)
Glucose, Bld: 89 mg/dL (ref 70–99)
HCT: 45 % (ref 39.0–52.0)
Hemoglobin: 15.3 g/dL (ref 13.0–17.0)
Potassium: 4.9 mmol/L (ref 3.5–5.1)
Sodium: 137 mmol/L (ref 135–145)
TCO2: 28 mmol/L (ref 22–32)

## 2024-05-22 LAB — BASIC METABOLIC PANEL WITH GFR
Anion gap: 17 — ABNORMAL HIGH (ref 5–15)
BUN: 45 mg/dL — ABNORMAL HIGH (ref 8–23)
CO2: 28 mmol/L (ref 22–32)
Calcium: 9.8 mg/dL (ref 8.9–10.3)
Chloride: 94 mmol/L — ABNORMAL LOW (ref 98–111)
Creatinine, Ser: 9.66 mg/dL — ABNORMAL HIGH (ref 0.61–1.24)
GFR, Estimated: 5 mL/min — ABNORMAL LOW (ref >60)
Glucose, Bld: 89 mg/dL (ref 70–99)
Potassium: 4.9 mmol/L (ref 3.5–5.1)
Sodium: 139 mmol/L (ref 135–145)

## 2024-05-22 LAB — HEPATITIS B SURFACE ANTIBODY, QUANTITATIVE: Hep B S AB Quant (Post): 16.3 m[IU]/mL

## 2024-05-22 NOTE — Progress Notes (Signed)
 Late note entry 11/6 1058am  D/c noted. Contacted pt out-pt HD clinic, [facility], to inform pt was d/c and should be back tomorrow, no further support needed, no d/c summ to fax over as pt was not formally admitted.   Laelle Bridgett Dialysis Navigator [phone]

## 2024-05-23 DIAGNOSIS — N186 End stage renal disease: Secondary | ICD-10-CM | POA: Diagnosis not present

## 2024-05-23 DIAGNOSIS — D509 Iron deficiency anemia, unspecified: Secondary | ICD-10-CM | POA: Diagnosis not present

## 2024-05-23 DIAGNOSIS — N2581 Secondary hyperparathyroidism of renal origin: Secondary | ICD-10-CM | POA: Diagnosis not present

## 2024-05-23 DIAGNOSIS — Z992 Dependence on renal dialysis: Secondary | ICD-10-CM | POA: Diagnosis not present

## 2024-05-23 DIAGNOSIS — N250 Renal osteodystrophy: Secondary | ICD-10-CM | POA: Diagnosis not present

## 2024-05-23 DIAGNOSIS — D631 Anemia in chronic kidney disease: Secondary | ICD-10-CM | POA: Diagnosis not present

## 2024-05-25 DIAGNOSIS — D509 Iron deficiency anemia, unspecified: Secondary | ICD-10-CM | POA: Diagnosis not present

## 2024-05-25 DIAGNOSIS — D631 Anemia in chronic kidney disease: Secondary | ICD-10-CM | POA: Diagnosis not present

## 2024-05-25 DIAGNOSIS — N2581 Secondary hyperparathyroidism of renal origin: Secondary | ICD-10-CM | POA: Diagnosis not present

## 2024-05-25 DIAGNOSIS — Z992 Dependence on renal dialysis: Secondary | ICD-10-CM | POA: Diagnosis not present

## 2024-05-25 DIAGNOSIS — N186 End stage renal disease: Secondary | ICD-10-CM | POA: Diagnosis not present

## 2024-05-25 DIAGNOSIS — N250 Renal osteodystrophy: Secondary | ICD-10-CM | POA: Diagnosis not present

## 2024-05-26 DIAGNOSIS — N186 End stage renal disease: Secondary | ICD-10-CM | POA: Diagnosis not present

## 2024-05-26 DIAGNOSIS — N2581 Secondary hyperparathyroidism of renal origin: Secondary | ICD-10-CM | POA: Diagnosis not present

## 2024-05-26 DIAGNOSIS — N250 Renal osteodystrophy: Secondary | ICD-10-CM | POA: Diagnosis not present

## 2024-05-26 DIAGNOSIS — D631 Anemia in chronic kidney disease: Secondary | ICD-10-CM | POA: Diagnosis not present

## 2024-05-26 DIAGNOSIS — D509 Iron deficiency anemia, unspecified: Secondary | ICD-10-CM | POA: Diagnosis not present

## 2024-05-26 DIAGNOSIS — Z992 Dependence on renal dialysis: Secondary | ICD-10-CM | POA: Diagnosis not present

## 2024-05-28 DIAGNOSIS — D509 Iron deficiency anemia, unspecified: Secondary | ICD-10-CM | POA: Diagnosis not present

## 2024-05-28 DIAGNOSIS — D631 Anemia in chronic kidney disease: Secondary | ICD-10-CM | POA: Diagnosis not present

## 2024-05-28 DIAGNOSIS — N2581 Secondary hyperparathyroidism of renal origin: Secondary | ICD-10-CM | POA: Diagnosis not present

## 2024-05-28 DIAGNOSIS — Z992 Dependence on renal dialysis: Secondary | ICD-10-CM | POA: Diagnosis not present

## 2024-05-28 DIAGNOSIS — N250 Renal osteodystrophy: Secondary | ICD-10-CM | POA: Diagnosis not present

## 2024-05-28 DIAGNOSIS — N186 End stage renal disease: Secondary | ICD-10-CM | POA: Diagnosis not present

## 2024-05-29 ENCOUNTER — Ambulatory Visit: Payer: Self-pay | Admitting: Gastroenterology

## 2024-05-30 DIAGNOSIS — N2581 Secondary hyperparathyroidism of renal origin: Secondary | ICD-10-CM | POA: Diagnosis not present

## 2024-05-30 DIAGNOSIS — N250 Renal osteodystrophy: Secondary | ICD-10-CM | POA: Diagnosis not present

## 2024-05-30 DIAGNOSIS — D631 Anemia in chronic kidney disease: Secondary | ICD-10-CM | POA: Diagnosis not present

## 2024-05-30 DIAGNOSIS — Z992 Dependence on renal dialysis: Secondary | ICD-10-CM | POA: Diagnosis not present

## 2024-05-30 DIAGNOSIS — D509 Iron deficiency anemia, unspecified: Secondary | ICD-10-CM | POA: Diagnosis not present

## 2024-05-30 DIAGNOSIS — N186 End stage renal disease: Secondary | ICD-10-CM | POA: Diagnosis not present

## 2024-06-02 DIAGNOSIS — D631 Anemia in chronic kidney disease: Secondary | ICD-10-CM | POA: Diagnosis not present

## 2024-06-02 DIAGNOSIS — N186 End stage renal disease: Secondary | ICD-10-CM | POA: Diagnosis not present

## 2024-06-02 DIAGNOSIS — N2581 Secondary hyperparathyroidism of renal origin: Secondary | ICD-10-CM | POA: Diagnosis not present

## 2024-06-02 DIAGNOSIS — Z992 Dependence on renal dialysis: Secondary | ICD-10-CM | POA: Diagnosis not present

## 2024-06-02 DIAGNOSIS — D509 Iron deficiency anemia, unspecified: Secondary | ICD-10-CM | POA: Diagnosis not present

## 2024-06-02 DIAGNOSIS — N250 Renal osteodystrophy: Secondary | ICD-10-CM | POA: Diagnosis not present

## 2024-06-04 DIAGNOSIS — N2581 Secondary hyperparathyroidism of renal origin: Secondary | ICD-10-CM | POA: Diagnosis not present

## 2024-06-04 DIAGNOSIS — Z992 Dependence on renal dialysis: Secondary | ICD-10-CM | POA: Diagnosis not present

## 2024-06-04 DIAGNOSIS — D631 Anemia in chronic kidney disease: Secondary | ICD-10-CM | POA: Diagnosis not present

## 2024-06-04 DIAGNOSIS — D509 Iron deficiency anemia, unspecified: Secondary | ICD-10-CM | POA: Diagnosis not present

## 2024-06-04 DIAGNOSIS — N250 Renal osteodystrophy: Secondary | ICD-10-CM | POA: Diagnosis not present

## 2024-06-04 DIAGNOSIS — N186 End stage renal disease: Secondary | ICD-10-CM | POA: Diagnosis not present

## 2024-06-05 DIAGNOSIS — E1142 Type 2 diabetes mellitus with diabetic polyneuropathy: Secondary | ICD-10-CM | POA: Diagnosis not present

## 2024-06-05 DIAGNOSIS — B351 Tinea unguium: Secondary | ICD-10-CM | POA: Diagnosis not present

## 2024-06-06 DIAGNOSIS — N250 Renal osteodystrophy: Secondary | ICD-10-CM | POA: Diagnosis not present

## 2024-06-06 DIAGNOSIS — N2581 Secondary hyperparathyroidism of renal origin: Secondary | ICD-10-CM | POA: Diagnosis not present

## 2024-06-06 DIAGNOSIS — N186 End stage renal disease: Secondary | ICD-10-CM | POA: Diagnosis not present

## 2024-06-06 DIAGNOSIS — D509 Iron deficiency anemia, unspecified: Secondary | ICD-10-CM | POA: Diagnosis not present

## 2024-06-06 DIAGNOSIS — Z992 Dependence on renal dialysis: Secondary | ICD-10-CM | POA: Diagnosis not present

## 2024-06-06 DIAGNOSIS — D631 Anemia in chronic kidney disease: Secondary | ICD-10-CM | POA: Diagnosis not present

## 2024-06-09 DIAGNOSIS — D509 Iron deficiency anemia, unspecified: Secondary | ICD-10-CM | POA: Diagnosis not present

## 2024-06-09 DIAGNOSIS — D631 Anemia in chronic kidney disease: Secondary | ICD-10-CM | POA: Diagnosis not present

## 2024-06-09 DIAGNOSIS — Z992 Dependence on renal dialysis: Secondary | ICD-10-CM | POA: Diagnosis not present

## 2024-06-09 DIAGNOSIS — N250 Renal osteodystrophy: Secondary | ICD-10-CM | POA: Diagnosis not present

## 2024-06-09 DIAGNOSIS — N186 End stage renal disease: Secondary | ICD-10-CM | POA: Diagnosis not present

## 2024-06-09 DIAGNOSIS — N2581 Secondary hyperparathyroidism of renal origin: Secondary | ICD-10-CM | POA: Diagnosis not present

## 2024-06-11 ENCOUNTER — Encounter: Payer: Self-pay | Admitting: Gastroenterology

## 2024-06-11 DIAGNOSIS — D631 Anemia in chronic kidney disease: Secondary | ICD-10-CM | POA: Diagnosis not present

## 2024-06-11 DIAGNOSIS — N250 Renal osteodystrophy: Secondary | ICD-10-CM | POA: Diagnosis not present

## 2024-06-11 DIAGNOSIS — N186 End stage renal disease: Secondary | ICD-10-CM | POA: Diagnosis not present

## 2024-06-11 DIAGNOSIS — N2581 Secondary hyperparathyroidism of renal origin: Secondary | ICD-10-CM | POA: Diagnosis not present

## 2024-06-11 DIAGNOSIS — D509 Iron deficiency anemia, unspecified: Secondary | ICD-10-CM | POA: Diagnosis not present

## 2024-06-11 DIAGNOSIS — Z992 Dependence on renal dialysis: Secondary | ICD-10-CM | POA: Diagnosis not present

## 2024-06-13 DIAGNOSIS — N186 End stage renal disease: Secondary | ICD-10-CM | POA: Diagnosis not present

## 2024-06-13 DIAGNOSIS — N250 Renal osteodystrophy: Secondary | ICD-10-CM | POA: Diagnosis not present

## 2024-06-13 DIAGNOSIS — D509 Iron deficiency anemia, unspecified: Secondary | ICD-10-CM | POA: Diagnosis not present

## 2024-06-13 DIAGNOSIS — Z992 Dependence on renal dialysis: Secondary | ICD-10-CM | POA: Diagnosis not present

## 2024-06-13 DIAGNOSIS — N2581 Secondary hyperparathyroidism of renal origin: Secondary | ICD-10-CM | POA: Diagnosis not present

## 2024-06-13 DIAGNOSIS — D631 Anemia in chronic kidney disease: Secondary | ICD-10-CM | POA: Diagnosis not present

## 2024-06-15 DIAGNOSIS — N186 End stage renal disease: Secondary | ICD-10-CM | POA: Diagnosis not present

## 2024-06-15 DIAGNOSIS — Z992 Dependence on renal dialysis: Secondary | ICD-10-CM | POA: Diagnosis not present

## 2024-06-24 ENCOUNTER — Ambulatory Visit: Admitting: Gastroenterology

## 2024-06-24 VITALS — BP 115/68 | HR 70 | Temp 98.6°F | Ht 64.0 in | Wt 217.8 lb

## 2024-06-24 DIAGNOSIS — K7469 Other cirrhosis of liver: Secondary | ICD-10-CM | POA: Diagnosis not present

## 2024-06-24 DIAGNOSIS — K7460 Unspecified cirrhosis of liver: Secondary | ICD-10-CM

## 2024-06-24 DIAGNOSIS — R188 Other ascites: Secondary | ICD-10-CM | POA: Diagnosis not present

## 2024-06-24 NOTE — Patient Instructions (Signed)
 I have ordered an ultrasound with possible fluid removal as soon as possible. We will be checking the fluid for any infection as well.   If you don't have fluid on your abdomen, you will need a CT scan without contrast.  If you have any worsening symptoms, please go to the emergency room.  I will see you in 4-6 weeks regardless!   I enjoyed seeing you again today! I value our relationship and want to provide genuine, compassionate, and quality care. You may receive a survey regarding your visit with me, and I welcome your feedback! Thanks so much for taking the time to complete this. I look forward to seeing you again.      Therisa MICAEL Stager, PhD, ANP-BC [facility]

## 2024-06-24 NOTE — Progress Notes (Unsigned)
 Gastroenterology Office Note     Primary Care Physician:  Carlette Benita Area, MD  Primary Gastroenterologist:   Chief Complaint   Chief Complaint  Patient presents with   Follow-up    Pt is bloated and cannot eat a lot. Pain left side all the way down . All this started before Thanksgiving.     History of Present Illness   [name] [name] is a 73 y.o. male presenting today with a history of GERD, anemia multifactorial with chronic disease/iron deficiency, previously on peritoneal dialysis but now on dialysis, cirrhosis, gallbladder polyp, severe aortic stenosis,    Completed Hep B vaccination and neeeds Hep A vaccination.   Upcoming ECHO Jan 2026.   Since a few weeks before Thanksgiving has felt bloated. Abdomen feels tight. On left side has discomfort but feels tighter than normal. No overt GI bleeding. Notes early satiety. Present since before thanksgiving with abdominal distension. No fever or chills. No dysphagia.  No lower extremity edema.   Dialysis M, W, Fri. Feels like it's hard to take a deep breath.   BM about once per day. Sometimes will feel like he has to strain a small amount. Last few days some straining. Nexium  BID. Only has nausea at dialysis, then goes away.   No added salt. Cardiology on 07/31/24.      Sept 2025 US  cirrhosis AFP Sept 2025 3.5,  Acute hepatitis panel negative.  Hep C negative Sept 2024. Thorough serologies with ASMA very weakly positive but IgG, IgM normal. No iron overload. Ferritin elevated. ANA, AMA negative,  Hgb 12.1. AFP tumor marker normal. MELD 3.0 is 19, driven by creatinine. ASMA is weakly positive, similar to prior. Ceruloplasmin elevated and suspect an acute phase reactant. Unable to collect 24 hour urinary copper in light of dialysis. Following labs serially and with US  every 6 months. Due to severe aortic stenosis, not a candidate for procedures such as EGD or liver biopsy right now. Would not recommend  liver biopsy as ASMA likely nonspecific elevated in setting of fatty liver, Immunoglobulins are normal.    Hx of hyperplastic polyps, colonoscopy 2026  Needs EGD for variceal screening but not candidate at this time due to severe aortic stenosis    Past Medical History:  Diagnosis Date   Anemia    Arthritis    Asthma    Bell palsy    left side of face- comes and goes per pt on 11/12/23   CAD (coronary artery disease)    a. 2014 MV: abnl w/ infap ischemia; b. 03/2013 Cath: aneurysmal bleb in the LAD w/ otw nonobs dzs-->Med Rx.   Chronic back pain    Chronic kidney disease    on dialysis - mon wed fri   Chronic knee pain    a. 09/2015 s/p R TKA.   Chronic pain    Chronic shoulder pain    Chronic sinusitis    COPD (chronic obstructive pulmonary disease) (HCC)    Diabetes mellitus without complication (HCC)    type II , no meds, does not check blood sugar   Dyspnea    with exertion   Dysrhythmia    2nd degree AV block   ESRD on peritoneal dialysis (HCC)    on peritoneal dialysis, [facility] - mon wed fri   Essential hypertension    GERD (gastroesophageal reflux disease)    Gout    Hepatomegaly    noted on noncontrast CT 2015  History of hiatal hernia    Hyperlipidemia    Hypothyroidism    Lateral meniscus tear    Myocardial infarction (HCC) 07/2021   Neuromuscular disorder (HCC)    Obesity    Truncal   Obstructive sleep apnea    does not use cpap    On home oxygen  therapy    uses 2L when needed   Pneumonia    PUD (peptic ulcer disease)    remote, reports f/u EGD about 8 years ago unremarkable    Reactive airway disease    related to exposure to chemical during 9/11   Sinusitis    Substance abuse (HCC)    hx in past   Vitamin D  deficiency     Past Surgical History:  Procedure Laterality Date   A/V FISTULAGRAM N/A 02/28/2023   Procedure: A/V Fistulagram;  Surgeon: Lanis Fonda BRAVO, MD;  Location: [facility] INVASIVE CV LAB;  Service: Cardiovascular;   Laterality: N/A;   A/V FISTULAGRAM N/A 10/22/2023   Procedure: A/V Fistulagram;  Surgeon: Sheree Penne Bruckner, MD;  Location: [facility] INVASIVE CV LAB;  Service: Cardiovascular;  Laterality: N/A;   ASAD LT SHOULDER  12/15/2008   left shoulder   AV FISTULA PLACEMENT Left 08/09/2016   Procedure: BRACHIOCEPHALIC ARTERIOVENOUS (AV) FISTULA CREATION LEFT ARM;  Surgeon: Carlin BRAVO Haddock, MD;  Location: [facility] OR;  Service: Vascular;  Laterality: Left;   BASCILIC VEIN TRANSPOSITION Left 11/13/2023   Procedure: TRANSPOSITION, VEIN, BASILIC;  Surgeon: Sheree Penne Bruckner, MD;  Location: [facility] OR;  Service: Vascular;  Laterality: Left;   CAPD INSERTION N/A 10/07/2018   Procedure: LAPAROSCOPIC PERITONEAL CATHETER PLACEMENT;  Surgeon: Stevie Herlene Righter, MD;  Location: [facility] ORS;  Service: General;  Laterality: N/A;   CATARACT EXTRACTION W/PHACO Left 03/28/2016   Procedure: CATARACT EXTRACTION PHACO AND INTRAOCULAR LENS PLACEMENT LEFT EYE;  Surgeon: Oneil Platts, MD;  Location: [facility] ORS;  Service: Ophthalmology;  Laterality: Left;  CDE: 4.77   CATARACT EXTRACTION W/PHACO Right 04/11/2016   Procedure: CATARACT EXTRACTION PHACO AND INTRAOCULAR LENS PLACEMENT RIGHT EYE; CDE:  4.74;  Surgeon: Oneil Platts, MD;  Location: [facility] ORS;  Service: Ophthalmology;  Laterality: Right;   COLONOSCOPY  10/15/2008   Fields: Rectal polyp obliterated, not retrieved, hemorrhoids, single ascending colon diverticulum near the CV. Next colonoscopy April 2020   COLONOSCOPY N/A 12/25/2014   SLF: 1. Colorectal polyps (2) removed 2. Small internal hemorrhoids 3. the left colon is severely redundant. hyperplastic polyps   CORONARY ARTERY BYPASS GRAFT N/A 06/05/2022   Procedure: OFF PUMP CORONARY ARTERY BYPASS GRAFTING (CABG) X 2 BYPASSES USING LEFT INTERNAL MAMMARY ARTERY AND RIGHT LEG GREATER SAPHENOUS VEIN HARVESTED ENDOSCOPICALLY;  Surgeon: Shyrl Linnie KIDD, MD;  Location: [facility] OR;  Service: Open Heart Surgery;  Laterality: N/A;    CORONARY STENT INTERVENTION N/A 07/25/2021   Procedure: CORONARY STENT INTERVENTION;  Surgeon: Wonda Sharper, MD;  Location: [facility] INVASIVE CV LAB;  Service: Cardiovascular;  Laterality: N/A;   CORONARY STENT INTERVENTION N/A 12/26/2021   Procedure: CORONARY STENT INTERVENTION;  Surgeon: Mady Bruckner, MD;  Location: [facility] INVASIVE CV LAB;  Service: Cardiovascular;  Laterality: N/A;   CORONARY STENT INTERVENTION N/A 01/20/2022   Procedure: CORONARY STENT INTERVENTION;  Surgeon: Wonda Sharper, MD;  Location: [facility] INVASIVE CV LAB;  Service: Cardiovascular;  Laterality: N/A;   DOPPLER ECHOCARDIOGRAPHY     ESOPHAGOGASTRODUODENOSCOPY N/A 12/25/2014   SLF: 1. Anemia most likely due to CRI, gastritis, gastric polyps 2. Moderate non-erosive gastriits and mild duodenitis.  3.TWo large gstric polyps removed.  EYE SURGERY  12/22/2010   tear duct probing-[place]   FOREIGN BODY REMOVAL  03/29/2011   Procedure: REMOVAL FOREIGN BODY EXTREMITY;  Surgeon: Taft Minerva, MD;  Location: [facility]S;  Service: Orthopedics;  Laterality: Right;  Removal Foreign Body Right Thumb   INSERTION OF DIALYSIS CATHETER N/A 11/13/2023   Procedure: INSERTION OF DIALYSIS CATHETER;  Surgeon: Sheree Penne Bruckner, MD;  Location: [facility] OR;  Service: Vascular;  Laterality: N/A;   IR FLUORO GUIDE CV LINE RIGHT  08/06/2018   IR FLUORO GUIDE CV LINE RIGHT  02/17/2023   IR US  GUIDE VASC ACCESS RIGHT  08/06/2018   IR US  GUIDE VASC ACCESS RIGHT  02/17/2023   KNEE ARTHROSCOPY  10/16/2007   left   KNEE ARTHROSCOPY WITH LATERAL MENISECTOMY Right 10/14/2015   Procedure: LEFT KNEE ARTHROSCOPY WITH PARTIAL LATERAL MENISECTOMY;  Surgeon: Taft FORBES Minerva, MD;  Location: [facility]S;  Service: Orthopedics;  Laterality: Right;   LEFT HEART CATH AND CORONARY ANGIOGRAPHY N/A 07/25/2021   Procedure: LEFT HEART CATH AND CORONARY ANGIOGRAPHY;  Surgeon: Wonda Sharper, MD;  Location: [facility] INVASIVE CV LAB;  Service: Cardiovascular;  Laterality: N/A;    LEFT HEART CATH AND CORONARY ANGIOGRAPHY N/A 12/26/2021   Procedure: LEFT HEART CATH AND CORONARY ANGIOGRAPHY;  Surgeon: Mady Bruckner, MD;  Location: [facility] INVASIVE CV LAB;  Service: Cardiovascular;  Laterality: N/A;   LEFT HEART CATH AND CORONARY ANGIOGRAPHY N/A 01/20/2022   Procedure: LEFT HEART CATH AND CORONARY ANGIOGRAPHY;  Surgeon: Wonda Sharper, MD;  Location: [facility] INVASIVE CV LAB;  Service: Cardiovascular;  Laterality: N/A;   LEFT HEART CATHETERIZATION WITH CORONARY ANGIOGRAM N/A 03/28/2013   Procedure: LEFT HEART CATHETERIZATION WITH CORONARY ANGIOGRAM;  Surgeon: Alm LELON Clay, MD;  Location: [facility] CATH LAB;  Service: Cardiovascular;  Laterality: N/A;   NM MYOVIEW  LTD     PENILE PROSTHESIS IMPLANT N/A 08/16/2015   Procedure: PENILE PROTHESIS INFLATABLE, three piece, Excisional biopsy of Penile ulcer, Penile molding;  Surgeon: Arlena Gal, MD;  Location: [facility] ORS;  Service: Urology;  Laterality: N/A;   PENILE PROSTHESIS IMPLANT N/A 12/24/2017   Procedure: REMOVAL AND  REPLACEMENT  COLOPLAST PENILE PROSTHESIS;  Surgeon: Carolee Sherwood JONETTA DOUGLAS, MD;  Location: [facility] ORS;  Service: Urology;  Laterality: N/A;   PERIPHERAL VASCULAR BALLOON ANGIOPLASTY  02/28/2023   Procedure: PERIPHERAL VASCULAR BALLOON ANGIOPLASTY;  Surgeon: Lanis Fonda FORBES, MD;  Location: [facility] INVASIVE CV LAB;  Service: Cardiovascular;;   PORT-A-CATH REMOVAL N/A 02/19/2023   Procedure: REMOVAL OF PERITONEAL DIALYSIS CATHETER;  Surgeon: Lanis Fonda FORBES, MD;  Location: [facility] OR;  Service: Vascular;  Laterality: N/A;   QUADRICEPS TENDON REPAIR  07/21/2011   Procedure: REPAIR QUADRICEP TENDON;  Surgeon: Taft Minerva, MD;  Location: [facility]S;  Service: Orthopedics;  Laterality: Right;   REVISON OF ARTERIOVENOUS FISTULA Left 11/13/2023   Procedure: REVISON OF ARTERIOVENOUS FISTULA;  Surgeon: Sheree Penne Bruckner, MD;  Location: [facility] OR;  Service: Vascular;  Laterality: Left;   RIGHT/LEFT HEART CATH AND CORONARY ANGIOGRAPHY N/A  05/30/2022   Procedure: RIGHT/LEFT HEART CATH AND CORONARY ANGIOGRAPHY;  Surgeon: Dann Candyce RAMAN, MD;  Location: [facility] INVASIVE CV LAB;  Service: Cardiovascular;  Laterality: N/A;   TEE WITHOUT CARDIOVERSION N/A 06/05/2022   Procedure: TRANSESOPHAGEAL ECHOCARDIOGRAM (TEE);  Surgeon: Shyrl Linnie KIDD, MD;  Location: [facility] OR;  Service: Open Heart Surgery;  Laterality: N/A;   TOENAIL EXCISION     removed x2-bilateral   UMBILICAL HERNIA REPAIR  07/17/2005   [place]    Current Outpatient Medications  Medication Sig  Dispense Refill   acetaminophen  (TYLENOL ) 650 MG CR tablet Take 1,300 mg by mouth every 8 (eight) hours as needed for pain.     albuterol  (VENTOLIN  HFA) 108 (90 Base) MCG/ACT inhaler Inhale 2 puffs into the lungs every 6 (six) hours as needed for wheezing or shortness of breath. 18 g 11   amLODipine  (NORVASC ) 5 MG tablet Take 1 tablet (5 mg total) by mouth daily. 30 tablet 5   ascorbic acid  (VITAMIN C ) 500 MG tablet Take 1 tablet (500 mg total) by mouth daily. 100 tablet 0   aspirin  EC 81 MG tablet Take 81 mg by mouth daily. Swallow whole.     atorvastatin  (LIPITOR ) 80 MG tablet Take 1 tablet (80 mg total) by mouth daily. 30 tablet 1   benzonatate  (TESSALON ) 100 MG capsule Take 100 mg by mouth daily.     calcitRIOL  (ROCALTROL ) 0.5 MCG capsule Take 0.5 mcg by mouth daily.     carvedilol  (COREG ) 6.25 MG tablet Take 6.25 mg by mouth 2 (two) times daily.     divalproex  (DEPAKOTE ) 250 MG DR tablet Take 250 mg by mouth 2 (two) times daily.     esomeprazole  (NEXIUM ) 40 MG capsule Take 1 capsule (40 mg total) by mouth 2 (two) times daily before a meal. 180 capsule 0   ferric citrate (AURYXIA) 1 GM 210 MG(Fe) tablet Take 420 mg by mouth 3 (three) times daily with meals.     Fluticasone -Umeclidin-Vilant (TRELEGY ELLIPTA ) 200-62.5-25 MCG/ACT AEPB Inhale 1 puff into the lungs daily. 28 each 5   loratadine  (CLARITIN ) 10 MG tablet Take 10 mg by mouth daily.     midodrine  (PROAMATINE ) 10 MG  tablet Take 10 mg by mouth 3 (three) times a week. Mon, Wed, and Friday     Multiple Vitamins-Minerals (MENS MULTIVITAMIN PO) Take 1 tablet by mouth daily.     OVER THE COUNTER MEDICATION Take 2 tablets by mouth daily at 6 (six) AM.     tamsulosin (FLOMAX) 0.4 MG CAPS capsule Take 0.4 mg by mouth daily after supper.     No current facility-administered medications for this visit.    Allergies as of 06/24/2024 - Review Complete 06/24/2024  Allergen Reaction Noted   Tramadol  Itching 07/10/2011   Opana [oxymorphone hcl] Itching 01/15/2014    Family History  Problem Relation Age of Onset   Hypertension Mother        MI   Cancer Mother        breast    Diabetes Mother    Diabetes Father    Hypertension Father    Hypertension Sister    Diabetes Sister    Arthritis Other    Asthma Other    Lung disease Other    Anesthesia problems Neg Hx    Hypotension Neg Hx    Malignant hyperthermia Neg Hx    Pseudochol deficiency Neg Hx    Colon cancer Neg Hx     Social History   Socioeconomic History   Marital status: Married    Spouse name: Not on file   Number of children: 2   Years of education: 12th grade   Highest education level: Not on file  Occupational History   Occupation: disabled   Occupation:      Associate Professor: UNEMPLOYED  Tobacco Use   Smoking status: Former    Current packs/day: 0.00    Average packs/day: 1 pack/day for 25.0 years (25.0 ttl pk-yrs)    Types: Cigarettes    Start date: 03/27/1985  Quit date: 03/27/2010    Years since quitting: 14.2   Smokeless tobacco: Never   Tobacco comments:    Quit in 2011  Vaping Use   Vaping status: Never Used  Substance and Sexual Activity   Alcohol  use: Yes    Comment: occasionally liquor   Drug use: Yes    Types: Marijuana    Comment: cocaine- last time used- 11/24/2017 , marijuana- last use 11/10/23 withhold 24 hrs prior to procedure   Sexual activity: Yes    Birth control/protection: None  Other Topics Concern   Not  on file  Social History Narrative   He quit smoking in 2010. 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 He developed pulmonary problems, became disabled because of lower airway disease in 2009.       WATCHES BASKETBALL. HIS TEAM IS [place].   Social Drivers of Corporate Investment Banker Strain: Low Risk  (03/05/2023)   Received from [facility]   Overall Financial Resource Strain (CARDIA)    Difficulty of Paying Living Expenses: Not hard at all  Food Insecurity: No Food Insecurity (08/25/2023)   Hunger Vital Sign    Worried About Running Out of Food in the Last Year: Never true    Ran Out of Food in the Last Year: Never true  Transportation Needs: No Transportation Needs (08/25/2023)   PRAPARE - Administrator, Civil Service (Medical): No    Lack of Transportation (Non-Medical): No  Physical Activity: Sufficiently Active (12/29/2021)   Exercise Vital Sign    Days of Exercise per Week: 3 days    Minutes of Exercise per Session: 60 min  Stress: Patient Declined (03/12/2023)   Received from [facility]   [employer] of Occupational Health - Occupational Stress Questionnaire    Feeling of Stress : Patient declined  Social Connections: Socially Integrated (08/25/2023)   Social Connection and Isolation Panel    Frequency of Communication with Friends and Family: More than three times a week    Frequency of Social Gatherings with Friends and Family: More than three times a week    Attends Religious Services: More than 4 times per year    Active Member of [employer] or Organizations: Yes    Attends Engineer, Structural: More than 4 times per year    Marital Status: Married  Catering Manager Violence: Not At Risk (08/25/2023)   Humiliation, Afraid, Rape, and Kick questionnaire    Fear of Current or Ex-Partner: No    Emotionally Abused: No    Physically Abused: No    Sexually Abused: No     Review of Systems   Gen: Denies any  fever, chills, fatigue, weight loss, lack of appetite.  CV: Denies chest pain, heart palpitations, peripheral edema, syncope.  Resp: Denies shortness of breath at rest or with exertion. Denies wheezing or cough.  GI: Denies dysphagia or odynophagia. Denies jaundice, hematemesis, fecal incontinence. GU : Denies urinary burning, urinary frequency, urinary hesitancy MS: Denies joint pain, muscle weakness, cramps, or limitation of movement.  Derm: Denies rash, itching, dry skin Psych: Denies depression, anxiety, memory loss, and confusion Heme: Denies bruising, bleeding, and enlarged lymph nodes.   Physical Exam   BP 115/68   Pulse 70   Temp 98.6 F (37 C)   Ht 5' 4 (1.626 m)   Wt 217 lb 12.8 oz (98.8 kg)   BMI 37.39 kg/m  General:   Alert and oriented. Pleasant  and cooperative. Well-nourished and well-developed.  Head:  Normocephalic and atraumatic. Eyes:  Without icterus Abdomen:  +BS, tense, distended, +fluid wave, TTP lower abdomen Rectal:  Deferred  Msk:  Symmetrical without gross deformities. Normal posture. Extremities:  Without edema. Neurologic:  Alert and  oriented x4;  grossly normal neurologically. Skin:  Intact without significant lesions or rashes. Psych:  Alert and cooperative. Normal mood and affect.   Assessment   [name] [name] is a 73 y.o. male presenting today with a history of    PLAN    US  abdomen complete with para ASAP Max 4 liters removed, needs Albumin  12.5 g IV if any fluid removed Cell count, culture, cytology To ED if worsening If no ascites, needs CT without contrast   Therisa MICAEL Stager, PhD, ANP-BC [facility] [place] [facility]

## 2024-06-26 ENCOUNTER — Ambulatory Visit: Payer: Self-pay | Admitting: Gastroenterology

## 2024-06-26 ENCOUNTER — Ambulatory Visit (HOSPITAL_COMMUNITY): Admission: RE | Admit: 2024-06-26

## 2024-06-26 ENCOUNTER — Ambulatory Visit (HOSPITAL_COMMUNITY)
Admission: RE | Admit: 2024-06-26 | Discharge: 2024-06-26 | Disposition: A | Discharge status: Home / Self Care | Source: Ambulatory Visit | Attending: Gastroenterology | Admitting: Gastroenterology

## 2024-06-26 DIAGNOSIS — R188 Other ascites: Secondary | ICD-10-CM | POA: Diagnosis not present

## 2024-06-26 DIAGNOSIS — K7460 Unspecified cirrhosis of liver: Secondary | ICD-10-CM | POA: Diagnosis present

## 2024-06-26 HISTORY — PX: IR PARACENTESIS: IMG2679

## 2024-06-26 LAB — BODY FLUID CELL COUNT WITH DIFFERENTIAL
Eos, Fluid: 0 %
Lymphs, Fluid: 23 %
Monocyte-Macrophage-Serous Fluid: 35 % — ABNORMAL LOW (ref 50–90)
Neutrophil Count, Fluid: 42 % — ABNORMAL HIGH (ref 0–25)
Total Nucleated Cell Count, Fluid: 1499 uL — ABNORMAL HIGH (ref 0–1000)

## 2024-06-26 LAB — GRAM STAIN

## 2024-06-26 MED ORDER — LIDOCAINE-EPINEPHRINE 1 %-1:100000 IJ SOLN
INTRAMUSCULAR | Status: AC
  Filled 2024-06-26: qty 1

## 2024-06-26 MED ORDER — LIDOCAINE-EPINEPHRINE 1 %-1:100000 IJ SOLN
20.0000 mL | Freq: Once | INTRAMUSCULAR | Status: AC
  Administered 2024-06-26: 10 mL via INTRADERMAL

## 2024-06-26 NOTE — Procedures (Signed)
 PROCEDURE SUMMARY:  Successful image-guided paracentesis from the right abdomen.  Yielded 4.0 liters of Clear, straw-colored peritoneal fluid.  No immediate complications.  EBL: zero Patient tolerated well.   Specimen was sent for labs.  Please see imaging section of Epic for full dictation.  Carlin LABOR Hillary Struss PA-C 06/26/2024 10:39 AM

## 2024-06-26 NOTE — Progress Notes (Signed)
 Pt to IR for scheduled procedure. Per order, give albumin . Pt. Educated on albumin  and IV placement. Pt. Refused albumin  at this time. No medication administered. All questions answered.

## 2024-06-27 ENCOUNTER — Other Ambulatory Visit: Payer: Self-pay

## 2024-06-27 ENCOUNTER — Encounter (HOSPITAL_COMMUNITY): Payer: Self-pay | Admitting: Emergency Medicine

## 2024-06-27 ENCOUNTER — Emergency Department (HOSPITAL_COMMUNITY)
Admission: EM | Admit: 2024-06-27 | Discharge: 2024-06-27 | Disposition: A | Discharge status: Home / Self Care | Source: Ambulatory Visit | Attending: Emergency Medicine | Admitting: Emergency Medicine

## 2024-06-27 ENCOUNTER — Telehealth: Payer: Self-pay | Admitting: Gastroenterology

## 2024-06-27 DIAGNOSIS — K652 Spontaneous bacterial peritonitis: Secondary | ICD-10-CM

## 2024-06-27 LAB — CBC WITH DIFFERENTIAL/PLATELET
Abs Immature Granulocytes: 0.03 K/uL (ref 0.00–0.07)
Basophils Absolute: 0 K/uL (ref 0.0–0.1)
Basophils Relative: 0 %
Eosinophils Absolute: 0.1 K/uL (ref 0.0–0.5)
Eosinophils Relative: 3 %
HCT: 39.1 % (ref 39.0–52.0)
Hemoglobin: 12.1 g/dL — ABNORMAL LOW (ref 13.0–17.0)
Immature Granulocytes: 1 %
Lymphocytes Relative: 10 %
Lymphs Abs: 0.4 K/uL — ABNORMAL LOW (ref 0.7–4.0)
MCH: 27 pg (ref 26.0–34.0)
MCHC: 30.9 g/dL (ref 30.0–36.0)
MCV: 87.3 fL (ref 80.0–100.0)
Monocytes Absolute: 0.5 K/uL (ref 0.1–1.0)
Monocytes Relative: 11 %
Neutro Abs: 3.4 K/uL (ref 1.7–7.7)
Neutrophils Relative %: 75 %
Platelets: 229 K/uL (ref 150–400)
RBC: 4.48 MIL/uL (ref 4.22–5.81)
RDW: 16 % — ABNORMAL HIGH (ref 11.5–15.5)
WBC: 4.5 K/uL (ref 4.0–10.5)
nRBC: 0 % (ref 0.0–0.2)

## 2024-06-27 LAB — COMPREHENSIVE METABOLIC PANEL WITH GFR
ALT: 64 U/L — ABNORMAL HIGH (ref 0–44)
AST: 45 U/L — ABNORMAL HIGH (ref 15–41)
Albumin: 3.4 g/dL — ABNORMAL LOW (ref 3.5–5.0)
Alkaline Phosphatase: 102 U/L (ref 38–126)
Anion gap: 15 (ref 5–15)
BUN: 19 mg/dL (ref 8–23)
CO2: 26 mmol/L (ref 22–32)
Calcium: 9.1 mg/dL (ref 8.9–10.3)
Chloride: 99 mmol/L (ref 98–111)
Creatinine, Ser: 6.25 mg/dL — ABNORMAL HIGH (ref 0.61–1.24)
GFR, Estimated: 9 mL/min — ABNORMAL LOW (ref >60)
Glucose, Bld: 102 mg/dL — ABNORMAL HIGH (ref 70–99)
Potassium: 3.6 mmol/L (ref 3.5–5.1)
Sodium: 140 mmol/L (ref 135–145)
Total Bilirubin: 0.5 mg/dL (ref 0.0–1.2)
Total Protein: 7.5 g/dL (ref 6.5–8.1)

## 2024-06-27 LAB — LACTIC ACID, PLASMA
Lactic Acid, Venous: 1 mmol/L (ref 0.5–1.9)
Lactic Acid, Venous: 1.1 mmol/L (ref 0.5–1.9)

## 2024-06-27 LAB — CYTOLOGY - NON PAP

## 2024-06-27 MED ORDER — CEFEPIME IV (FOR PTA / DISCHARGE USE ONLY): 2.0000 g | INTRAVENOUS | Status: DC

## 2024-06-27 MED ORDER — FENTANYL CITRATE (PF) 100 MCG/2ML IJ SOLN
25.0000 ug | Freq: Once | INTRAMUSCULAR | Status: AC
  Administered 2024-06-27: 25 ug via INTRAVENOUS
  Filled 2024-06-27: qty 2

## 2024-06-27 MED ORDER — SODIUM CHLORIDE 0.9 % IV SOLN
2.0000 g | Freq: Once | INTRAVENOUS | Status: AC
  Administered 2024-06-27: 2 g via INTRAVENOUS
  Filled 2024-06-27: qty 12.5

## 2024-06-27 MED ORDER — CEFEPIME IV (FOR PTA / DISCHARGE USE ONLY): 2.0000 g | INTRAVENOUS | Status: AC

## 2024-06-27 MED ORDER — SODIUM CHLORIDE 0.9 % IV SOLN: 2.0000 g | INTRAVENOUS | Status: DC

## 2024-06-27 MED ORDER — ALBUMIN HUMAN 25 % IV SOLN
25.0000 g | Freq: Four times a day (QID) | INTRAVENOUS | Status: DC
  Administered 2024-06-27: 25 g via INTRAVENOUS
  Filled 2024-06-27: qty 100

## 2024-06-27 MED ORDER — ALBUMIN HUMAN 25 % IV SOLN: 25.0000 g | Freq: Four times a day (QID) | INTRAVENOUS | Status: DC

## 2024-06-27 MED ORDER — SODIUM CHLORIDE 0.9 % IV SOLN
2.0000 g | Freq: Once | INTRAVENOUS | Status: AC
  Administered 2024-06-27: 2 g via INTRAVENOUS
  Filled 2024-06-27: qty 20

## 2024-06-27 NOTE — Telephone Encounter (Signed)
 Please arrange hospital follow up for patient within 1 week per Dr. Eartha. Therisa has opening on 12/18 @ 1:30 that can be offered to patient.   Charmaine Melia, MSN, APRN, FNP-BC, AGACNP-BC [facility] at [facility]

## 2024-06-27 NOTE — ED Provider Triage Note (Signed)
 Emergency Medicine Provider Triage Evaluation Note  [name] [name] , a 73 y.o. male  was evaluated in triage.  Pt complains of abdominal pain, abnormal labs. Patient reportedly had paracentesis performed yesterday by IR who called him this morning concerned that he may have SBP given fluid collection results. Advised to come into the ED for IV antibiotics. He endorses slight pain in the left lower abdomen which is opposite where he had paracentesis performed. No fever, chills, or bodyaches.  Review of Systems  Positive: As above Negative: As above  Physical Exam  BP 104/65 (BP Location: Right Arm)   Pulse 60   Temp 99 F (37.2 C)   Resp 18   Ht 5' 4 (1.626 m)   Wt 96.6 kg   SpO2 93%   BMI 36.56 kg/m  Gen:   Awake, no distress   Resp:  Normal effort  MSK:   Moves extremities without difficulty  Other:  Abdomen is mildly distended with no palpable warmth and slight tenderness to the left lower quadrant.  Medical Decision Making  Medically screening exam initiated at 11:31 AM.  Appropriate orders placed.  Norleen LELON Pesa was informed that the remainder of the evaluation will be completed by another provider, this initial triage assessment does not replace that evaluation, and the importance of remaining in the ED until their evaluation is complete.     Kailyn Vanderslice A, PA-C 06/27/24 1133

## 2024-06-27 NOTE — ED Triage Notes (Signed)
 Pt complains of left lower abd pain. [facility] yesterday for outpt [facility]acentesis. Pt received call today that fluid that was removed and sent to lab was positive for an infection and advised he come to ED for IV antibiotics. Pt is dialysis pt and received full treatment today. Denies any vomiting or diarrhea.

## 2024-06-27 NOTE — ED Provider Notes (Signed)
 [place] EMERGENCY DEPARTMENT AT [facility] Provider Note   CSN: [id number] Arrival date & time: 06/27/24  1043     Patient presents with: Abdominal Pain and Abnormal Labs   [name] [name] is a 73 y.o. male. Patient with past history significant for type 2 diabetes, CAD, obesity, ESRD on HD, constipation, prior NSTEMI presents to the ED with concerns of abnormal labs and abdominal discomfort. Reportedly had paracentesis performed yesterday by IR and labs resulted today with fluid analysis concerning for SBP. He denies any fever, generalized abdominal pain, and as he is post-paracentesis, no abdominal distension. He states he otherwise feels well at this time.   Abdominal Pain      Prior to Admission medications  Medication Sig Start Date End Date Taking? Authorizing Provider  acetaminophen  (TYLENOL ) 650 MG CR tablet Take 1,300 mg by mouth every 8 (eight) hours as needed for pain.    [provider]  albuterol  (VENTOLIN  HFA) 108 (90 Base) MCG/ACT inhaler Inhale 2 puffs into the lungs every 6 (six) hours as needed for wheezing or shortness of breath. 08/26/23   Pearlean Manus, MD  amLODipine  (NORVASC ) 5 MG tablet Take 1 tablet (5 mg total) by mouth daily. 08/26/23   Pearlean Manus, MD  ascorbic acid  (VITAMIN C ) 500 MG tablet Take 1 tablet (500 mg total) by mouth daily. 05/07/23   Setzer, Nena PARAS, PA-C  aspirin  EC 81 MG tablet Take 81 mg by mouth daily. Swallow whole.    [provider]  atorvastatin  (LIPITOR ) 80 MG tablet Take 1 tablet (80 mg total) by mouth daily. 06/20/22   Dwan Kyla HERO, PA-C  benzonatate  (TESSALON ) 100 MG capsule Take 100 mg by mouth daily. 04/28/24   [provider]  calcitRIOL  (ROCALTROL ) 0.5 MCG capsule Take 0.5 mcg by mouth daily.    [provider]  carvedilol  (COREG ) 6.25 MG tablet Take 6.25 mg by mouth 2 (two) times daily. 03/27/24   [provider]  ceFEPime  (MAXIPIME ) IVPB Inject 2 g into the  vein every Monday, Wednesday, and Friday with hemodialysis for 4 days. Indication:  SBP  First Dose: Yes Last Day of Therapy:  07/04/24 06/30/24 07/04/24  Fleeta Rothman, Jomarie SAILOR, MD  divalproex  (DEPAKOTE ) 250 MG DR tablet Take 250 mg by mouth 2 (two) times daily. 03/27/24   [provider]  esomeprazole  (NEXIUM ) 40 MG capsule Take 1 capsule (40 mg total) by mouth 2 (two) times daily before a meal. 07/16/23   Shirlean Therisa LELON, NP  ferric citrate (AURYXIA) 1 GM 210 MG(Fe) tablet Take 420 mg by mouth 3 (three) times daily with meals.    [provider]  Fluticasone -Umeclidin-Vilant (TRELEGY ELLIPTA ) 200-62.5-25 MCG/ACT AEPB Inhale 1 puff into the lungs daily. 11/22/23   Hope Almarie LELON, NP  loratadine  (CLARITIN ) 10 MG tablet Take 10 mg by mouth daily.    [provider]  midodrine  (PROAMATINE ) 10 MG tablet Take 10 mg by mouth 3 (three) times a week. Pablo Heidelberg, and Friday 12/26/23   [provider]  Multiple Vitamins-Minerals (MENS MULTIVITAMIN PO) Take 1 tablet by mouth daily.    [provider]  OVER THE COUNTER MEDICATION Take 2 tablets by mouth daily at 6 (six) AM.    [provider]  tamsulosin (FLOMAX) 0.4 MG CAPS capsule Take 0.4 mg by mouth daily after supper. 09/17/23   [provider]    Allergies: Tramadol  and Opana [oxymorphone hcl]    Review of Systems  Gastrointestinal:  Positive for abdominal pain.  All other systems reviewed and are negative.   Updated Vital Signs BP 115/60 (BP Location: Right Arm)   Pulse 70   Temp 98.2 F (36.8 C) (Oral)   Resp 17   Ht 5' 4 (1.626 m)   Wt 96.6 kg   SpO2 92%   BMI 36.56 kg/m   Physical Exam Vitals and nursing note reviewed.  Constitutional:      General: He is not in acute distress.    Appearance: He is well-developed.  HENT:     Head: Normocephalic and atraumatic.  Eyes:     Conjunctiva/sclera: Conjunctivae normal.  Cardiovascular:     Rate and Rhythm: Normal rate and  regular rhythm.     Heart sounds: No murmur heard. Pulmonary:     Effort: Pulmonary effort is normal. No respiratory distress.     Breath sounds: Normal breath sounds.  Abdominal:     General: Abdomen is protuberant. Bowel sounds are normal.     Palpations: Abdomen is soft.     Tenderness: There is abdominal tenderness in the left lower quadrant. There is no guarding.     Comments: Mild TTP along the LLQ.  Musculoskeletal:        General: No swelling.     Cervical back: Neck supple.  Skin:    General: Skin is warm and dry.     Capillary Refill: Capillary refill takes less than 2 seconds.  Neurological:     Mental Status: He is alert.  Psychiatric:        Mood and Affect: Mood normal.     (all labs ordered are listed, but only abnormal results are displayed) Labs Reviewed  COMPREHENSIVE METABOLIC PANEL WITH GFR - Abnormal; Notable for the following components:      Result Value   Glucose, Bld 102 (*)    Creatinine, Ser 6.25 (*)    Albumin  3.4 (*)    AST 45 (*)    ALT 64 (*)    GFR, Estimated 9 (*)    All other components within normal limits  CBC WITH DIFFERENTIAL/PLATELET - Abnormal; Notable for the following components:   Hemoglobin 12.1 (*)    RDW 16.0 (*)    Lymphs Abs 0.4 (*)    All other components within normal limits  CULTURE, BLOOD (ROUTINE X 2)  CULTURE, BLOOD (ROUTINE X 2)  LACTIC ACID, PLASMA  LACTIC ACID, PLASMA  COMPREHENSIVE METABOLIC PANEL WITH GFR  PROTIME-INR    EKG: None  Radiology: IR Paracentesis Result Date: 06/26/2024 INDICATION: new onset ascites 73 year old male with history of GERD, or hemodialysis, previously on peritoneal dialysis, cirrhosis, now with ascites. IR is requested for diagnostic and therapeutic paracentesis. 4 L maximum. Albumin  requested. EXAM: ULTRASOUND GUIDED DIAGNOSTIC AND THERAPEUTIC PARACENTESIS MEDICATIONS: 5 cc of 1% lidocaine . COMPLICATIONS: None immediate. PROCEDURE: Informed written consent was obtained from the  patient after a discussion of the risks, benefits and alternatives to treatment. A timeout was performed prior to the initiation of the procedure. Initial ultrasound scanning demonstrates a large amount of ascites within the right lower abdominal quadrant. The right lower abdomen was prepped and draped in the usual sterile fashion. 1% lidocaine  was used for local anesthesia. Following this, a 19 gauge, 7-cm, Yueh catheter was introduced. An ultrasound image was saved for documentation purposes. The paracentesis was performed. The catheter was removed and a dressing was applied. The patient tolerated the procedure well without immediate post procedural complication. Patient was educated on and offered  post-procedure intravenous albumin , as per order request. Patient did refuse albumin  at this time. See nursing notes in Epic for details. FINDINGS: A total of approximately 4.0 L of clear, straw-colored is peritoneal fluid was removed. Samples were sent to the laboratory as requested by the clinical team. IMPRESSION: Successful ultrasound-guided paracentesis yielding 4.0 L of peritoneal fluid. Performed by: Carlin Griffon, PA-C PLAN: If the patient eventually requires >/=2 paracenteses in a 30 day period, candidacy for formal evaluation by the [facility]ventional Radiology [facility] will be assessed. Thom Hall, MD [facility] Radiology Electronically Signed   By: Thom Hall M.D.   On: 06/26/2024 12:32     Procedures   Medications Ordered in the ED  albumin  human 25 % solution 25 g (0 g Intravenous Stopped 06/27/24 1523)  albumin  human 25 % solution 25 g (has no administration in time range)  cefTRIAXone  (ROCEPHIN ) 2 g in sodium chloride  0.9 % 100 mL IVPB (0 g Intravenous Stopped 06/27/24 1350)  fentaNYL  (SUBLIMAZE ) injection 25 mcg (25 mcg Intravenous Given 06/27/24 1317)  fentaNYL  (SUBLIMAZE ) injection 25 mcg (25 mcg Intravenous Given  06/27/24 1500)  ceFEPIme  (MAXIPIME ) 2 g in sodium chloride  0.9 % 100 mL IVPB (0 g Intravenous Stopped 06/27/24 1630)                                    Medical Decision Making Amount and/or Complexity of Data Reviewed Labs: ordered.  Risk Prescription drug management. Decision regarding hospitalization.   This patient presents to the ED for concern of abdominal pain, abnormal lab, this involves an extensive number of treatment options, and is a complaint that carries with it a high risk of complications and morbidity.  The differential diagnosis includes SVT, endocarditis, bowel obstruction, gastroenteritis   Co morbidities that complicate the patient evaluation  ESRD on hemodialysis, ascites, type 2 diabetes, CAD, obesity   Additional history obtained:  Additional history obtained from chart review   Lab Tests:  I Ordered, and personally interpreted labs.  The pertinent results include: CBC with stable hemoglobin at 12.1, CMP at baseline, lactic acid unremarkable 1.1, blood cultures collected and pending.  Results from body fluid cell count shows a yellow hue the appearance of abdominal fluid with total nucleated cell count at approximately 1500 with neutrophil count of 42%.   Consultations Obtained:  I requested consultation with the gastroenterology, hospitalist,  and discussed lab and imaging findings as well as pertinent plan - they recommend: Spoke with Dr. Eartha, GI, who advised admission for management of SBP. Spoke with Dr. Pearlean, hospitalist, who will be admitting patient.   Problem List / ED Course / Critical interventions / Medication management  Patient with past history significant for ESRD on HD presents to the ED with concerns of abdominal discomfort and abnormal labs. Patient had therapeutic paracentesis performed yesterday by IR and was informed today by GI of concerns of SBP. Patient reports improved abdominal discomfort after paracentesis. He  otherwise states that he is feeling fine at this time and was directed to come to the ED due to the SBP concern. On exam, he has slightly LLQ tenderness but no guarding. Normal bowel sounds. He is otherwise well appearing. Labs are reassuring with no appreciable leukocytosis and lactic acid is unremarkable at 1.1. Started on Rocephin  due to SBP concern. Spoke with Dr. Eartha, GI, who advised admission for antibiotics for treatment of  SBP. Spoke with Dr. KYM Carwin, hospitalist, who advised that if patient is otherwise stable and well appearing, antibiotics could be incorporated into his dialysis sessions instead of having him admitted. Dr. Carwin was able to coordinate with ID and HD coordination for outpatient antibiotics and albumin . I informed GI of these recommendations and they advised this could be done if patient understands clearly the concern they have and if we can guarantee that he can receive albumin  during his HD sessions. Appears that albumin  has been coordinated for his sessions. Patient is otherwise stable for outpatient follow up and will follow up with Shirlean, NP GI, on 07/03/2024 at 1:30pm. I ordered medication including fentanyl , Rocephin , cefepime , albumin  for pain, SBP suspected, ESRD  Reevaluation of the patient after these medicines showed that the patient improved I have reviewed the patients home medicines and have made adjustments as needed   Social Determinants of Health:  ESRD on HD   Test / Admission - Considered:  Admission required  Final diagnoses:  SBP (spontaneous bacterial peritonitis) [facility])    ED Discharge Orders          Ordered    ceFEPime  (MAXIPIME ) IVPB  Every M-W-F (Hemodialysis)        06/27/24 1523    ceFEPime  (MAXIPIME ) IVPB  Every T-Th-Sa (Hemodialysis),   Status:  Discontinued        06/27/24 1512    Home infusion instructions        06/27/24 1512               Cecily Legrand DELENA DEVONNA 06/27/24 NANCYANN Suzette Pac,  MD 06/30/24 1144

## 2024-06-27 NOTE — Discharge Instructions (Addendum)
 You were seen in the ER today for concerns of abdominal discomfort and abnormal labs. Your labs today were reassuring but given your results from your paracentesis yesterday, there is concern that you have developed a condition called SBP (spontaneous bacterial peritonitis). This requires treatment with IV antibiotics which you have started today but will need to continue over the next week during your dialysis sessions. Please make sure you are continuing with these sessions so you may received treatment. If you have any worsening in symptoms, return to the emergency department.  You also have an appointment tentatively scheduled with Therisa Stager, NP with [facility] GI. Please reach out to their office for any appointment changes if needed.

## 2024-06-27 NOTE — Progress Notes (Addendum)
°  I contacted nephrology team, ID pharmacist, and ID physician on behalf of EDP  - ESRD pt on HD MWF--last HD 06/27/24 --------paracentensis with concerns for possible SBP-----stable otherwise ----@GI  office called him at home today and asked him to come to ED due to peritoneal fluid from yesterday's para tap being suggestive of SBP---- -Per EDP patient reports no significant abdominal pain, no fevers no chills CBC without leukocytosis -Multidisciplinary conference/conversation with @ nephrology Dr. Bernardino Gasman, @ ID Dr. Fleeta Rothman, @ID  pharmacist Damien Quiet, @nephrology  navigator/coordinator Ms Lavanda Fredrickson --@ID  recommends cefepime  2 g Monday Wednesday Friday with HD first dose 06/27/2024 in ED, last dose 07/04/2024 -Peritoneal fluid culture pending at this time -Further management by EDP based on recommendation from ID and nephrology teams  - EDP will call hospitalist team back if further need arises -Plan for now is for EDP to discharge patient from the ED with above recommendations from nephrology and ID teams -- Patient was neither seen nor examined by hospitalist team on 07/05/2024.  No charge note -- Rendall Carwin, MD

## 2024-06-27 NOTE — Consult Note (Signed)
 Gastroenterology Consult   Referring Provider: No ref. provider found Primary Care Physician:  [name], Tesfaye Demissie, MD Primary Gastroenterologist:  Carlin POUR. Cindie, DO   Patient ID: [name] [name]; [id number]; Oct 26, 1950   Admit date: 06/27/2024  LOS: 0 days   Date of Consultation: 06/27/2024  Reason for Consultation:  SBP, increased PMNs  History of Present Illness   [name] [name] is a 73 y.o. year old male with history of GERD, anemia which is multifactorial with chronic disease/IDA, previously on peritoneal dialysis however had previous intra-abdominal infection from peritoneal dialysis catheter and now currently on HD, cirrhosis, gallbladder polyp, severe aortic stenosis, CAD s/p CABG, diabetes, COPD, HTN, HLD, OSA, and CHF who presented to the ED at the recommendation of his primary GI provider Therisa Stager, NP given positive cell count on recent paracentesis and recurrent abdominal pain.   Underwent paracentesis yesterday 12/11 at [facility] with removal of 4 L.  Body fluid cell count with differential revealed total nucleated cell count of 1499 with 42% neutrophil count therefore PMNs 629.5 (> 250) which is indicator of SBP.  Cytology is in process along with acid-fast culture and AFB smear.  Gram stain negative and culture pending with no growth in the last 24 hours.  Labs in the ED: AST 45, ALT 64, T. bili 0.5, alk phos 102, albumin  3.4, creatinine 6.25 (finished dialysis today), potassium 3.6, sodium 140.  Hemoglobin 12.1, platelets 229, lactate 1.  Blood cultures pending.  Patient reports abdominal pain to the left lower quadrant.  Denies any fevers or significant lower extremity swelling.  Has not had any upper GI symptoms to mention.  He denies any melena or BRBPR.  Stated he tolerated dialysis fine earlier today.  No syncope or presyncope, dizziness, or lightheadedness.  Past Medical History:  Diagnosis Date   Anemia    Arthritis    Asthma    Bell palsy    left  side of face- comes and goes per pt on 11/12/23   CAD (coronary artery disease)    a. 2014 MV: abnl w/ infap ischemia; b. 03/2013 Cath: aneurysmal bleb in the LAD w/ otw nonobs dzs-->Med Rx.   Chronic back pain    Chronic kidney disease    on dialysis - mon wed fri   Chronic knee pain    a. 09/2015 s/p R TKA.   Chronic pain    Chronic shoulder pain    Chronic sinusitis    COPD (chronic obstructive pulmonary disease) (HCC)    Diabetes mellitus without complication (HCC)    type II , no meds, does not check blood sugar   Dyspnea    with exertion   Dysrhythmia    2nd degree AV block   ESRD on peritoneal dialysis (HCC)    on peritoneal dialysis, [facility] - mon wed fri   Essential hypertension    GERD (gastroesophageal reflux disease)    Gout    Hepatomegaly    noted on noncontrast CT 2015   History of hiatal hernia    Hyperlipidemia    Hypothyroidism    Lateral meniscus tear    Myocardial infarction (HCC) 07/2021   Neuromuscular disorder (HCC)    Obesity    Truncal   Obstructive sleep apnea    does not use cpap    On home oxygen  therapy    uses 2L when needed   Pneumonia    PUD (peptic ulcer disease)    remote, reports f/u EGD about 8 years  ago unremarkable    Reactive airway disease    related to exposure to chemical during 9/11   Sinusitis    Substance abuse (HCC)    hx in past   Vitamin D  deficiency     Past Surgical History:  Procedure Laterality Date   A/V FISTULAGRAM N/A 02/28/2023   Procedure: A/V Fistulagram;  Surgeon: Lanis Fonda BRAVO, MD;  Location: [facility] INVASIVE CV LAB;  Service: Cardiovascular;  Laterality: N/A;   A/V FISTULAGRAM N/A 10/22/2023   Procedure: A/V Fistulagram;  Surgeon: Sheree Penne Bruckner, MD;  Location: [facility] INVASIVE CV LAB;  Service: Cardiovascular;  Laterality: N/A;   ASAD LT SHOULDER  12/15/2008   left shoulder   AV FISTULA PLACEMENT Left 08/09/2016   Procedure: BRACHIOCEPHALIC ARTERIOVENOUS (AV) FISTULA CREATION LEFT ARM;   Surgeon: Carlin BRAVO Haddock, MD;  Location: [facility] OR;  Service: Vascular;  Laterality: Left;   BASCILIC VEIN TRANSPOSITION Left 11/13/2023   Procedure: TRANSPOSITION, VEIN, BASILIC;  Surgeon: Sheree Penne Bruckner, MD;  Location: [facility] OR;  Service: Vascular;  Laterality: Left;   CAPD INSERTION N/A 10/07/2018   Procedure: LAPAROSCOPIC PERITONEAL CATHETER PLACEMENT;  Surgeon: Stevie Herlene Righter, MD;  Location: [facility] ORS;  Service: General;  Laterality: N/A;   CATARACT EXTRACTION W/PHACO Left 03/28/2016   Procedure: CATARACT EXTRACTION PHACO AND INTRAOCULAR LENS PLACEMENT LEFT EYE;  Surgeon: Oneil Platts, MD;  Location: [facility] ORS;  Service: Ophthalmology;  Laterality: Left;  CDE: 4.77   CATARACT EXTRACTION W/PHACO Right 04/11/2016   Procedure: CATARACT EXTRACTION PHACO AND INTRAOCULAR LENS PLACEMENT RIGHT EYE; CDE:  4.74;  Surgeon: Oneil Platts, MD;  Location: [facility] ORS;  Service: Ophthalmology;  Laterality: Right;   COLONOSCOPY  10/15/2008   Fields: Rectal polyp obliterated, not retrieved, hemorrhoids, single ascending colon diverticulum near the CV. Next colonoscopy April 2020   COLONOSCOPY N/A 12/25/2014   SLF: 1. Colorectal polyps (2) removed 2. Small internal hemorrhoids 3. the left colon is severely redundant. hyperplastic polyps   CORONARY ARTERY BYPASS GRAFT N/A 06/05/2022   Procedure: OFF PUMP CORONARY ARTERY BYPASS GRAFTING (CABG) X 2 BYPASSES USING LEFT INTERNAL MAMMARY ARTERY AND RIGHT LEG GREATER SAPHENOUS VEIN HARVESTED ENDOSCOPICALLY;  Surgeon: Shyrl Linnie KIDD, MD;  Location: [facility] OR;  Service: Open Heart Surgery;  Laterality: N/A;   CORONARY STENT INTERVENTION N/A 07/25/2021   Procedure: CORONARY STENT INTERVENTION;  Surgeon: Wonda Sharper, MD;  Location: [facility] INVASIVE CV LAB;  Service: Cardiovascular;  Laterality: N/A;   CORONARY STENT INTERVENTION N/A 12/26/2021   Procedure: CORONARY STENT INTERVENTION;  Surgeon: Mady Bruckner, MD;  Location: [facility] INVASIVE CV LAB;  Service: Cardiovascular;   Laterality: N/A;   CORONARY STENT INTERVENTION N/A 01/20/2022   Procedure: CORONARY STENT INTERVENTION;  Surgeon: Wonda Sharper, MD;  Location: [facility] INVASIVE CV LAB;  Service: Cardiovascular;  Laterality: N/A;   DOPPLER ECHOCARDIOGRAPHY     ESOPHAGOGASTRODUODENOSCOPY N/A 12/25/2014   SLF: 1. Anemia most likely due to CRI, gastritis, gastric polyps 2. Moderate non-erosive gastriits and mild duodenitis.  3.TWo large gstric polyps removed.    EYE SURGERY  12/22/2010   tear duct probing-[facility]   FOREIGN BODY REMOVAL  03/29/2011   Procedure: REMOVAL FOREIGN BODY EXTREMITY;  Surgeon: Taft Minerva, MD;  Location: [facility]S;  Service: Orthopedics;  Laterality: Right;  Removal Foreign Body Right Thumb   INSERTION OF DIALYSIS CATHETER N/A 11/13/2023   Procedure: INSERTION OF DIALYSIS CATHETER;  Surgeon: Sheree Penne Bruckner, MD;  Location: [facility] OR;  Service: Vascular;  Laterality: N/A;   IR FLUORO GUIDE CV LINE  RIGHT  08/06/2018   IR FLUORO GUIDE CV LINE RIGHT  02/17/2023   IR PARACENTESIS  06/26/2024   IR US  GUIDE VASC ACCESS RIGHT  08/06/2018   IR US  GUIDE VASC ACCESS RIGHT  02/17/2023   KNEE ARTHROSCOPY  10/16/2007   left   KNEE ARTHROSCOPY WITH LATERAL MENISECTOMY Right 10/14/2015   Procedure: LEFT KNEE ARTHROSCOPY WITH PARTIAL LATERAL MENISECTOMY;  Surgeon: Taft FORBES Minerva, MD;  Location: [facility]S;  Service: Orthopedics;  Laterality: Right;   LEFT HEART CATH AND CORONARY ANGIOGRAPHY N/A 07/25/2021   Procedure: LEFT HEART CATH AND CORONARY ANGIOGRAPHY;  Surgeon: Wonda Sharper, MD;  Location: [facility] INVASIVE CV LAB;  Service: Cardiovascular;  Laterality: N/A;   LEFT HEART CATH AND CORONARY ANGIOGRAPHY N/A 12/26/2021   Procedure: LEFT HEART CATH AND CORONARY ANGIOGRAPHY;  Surgeon: Mady Bruckner, MD;  Location: [facility] INVASIVE CV LAB;  Service: Cardiovascular;  Laterality: N/A;   LEFT HEART CATH AND CORONARY ANGIOGRAPHY N/A 01/20/2022   Procedure: LEFT HEART CATH AND CORONARY ANGIOGRAPHY;   Surgeon: Wonda Sharper, MD;  Location: [facility] INVASIVE CV LAB;  Service: Cardiovascular;  Laterality: N/A;   LEFT HEART CATHETERIZATION WITH CORONARY ANGIOGRAM N/A 03/28/2013   Procedure: LEFT HEART CATHETERIZATION WITH CORONARY ANGIOGRAM;  Surgeon: Alm LELON Clay, MD;  Location: [facility] CATH LAB;  Service: Cardiovascular;  Laterality: N/A;   NM MYOVIEW  LTD     PENILE PROSTHESIS IMPLANT N/A 08/16/2015   Procedure: PENILE PROTHESIS INFLATABLE, three piece, Excisional biopsy of Penile ulcer, Penile molding;  Surgeon: Arlena Gal, MD;  Location: [facility] ORS;  Service: Urology;  Laterality: N/A;   PENILE PROSTHESIS IMPLANT N/A 12/24/2017   Procedure: REMOVAL AND  REPLACEMENT  COLOPLAST PENILE PROSTHESIS;  Surgeon: Carolee Sherwood JONETTA DOUGLAS, MD;  Location: [facility] ORS;  Service: Urology;  Laterality: N/A;   PERIPHERAL VASCULAR BALLOON ANGIOPLASTY  02/28/2023   Procedure: PERIPHERAL VASCULAR BALLOON ANGIOPLASTY;  Surgeon: Lanis Fonda FORBES, MD;  Location: [facility] INVASIVE CV LAB;  Service: Cardiovascular;;   PORT-A-CATH REMOVAL N/A 02/19/2023   Procedure: REMOVAL OF PERITONEAL DIALYSIS CATHETER;  Surgeon: Lanis Fonda FORBES, MD;  Location: [facility] OR;  Service: Vascular;  Laterality: N/A;   QUADRICEPS TENDON REPAIR  07/21/2011   Procedure: REPAIR QUADRICEP TENDON;  Surgeon: Taft Minerva, MD;  Location: [facility];  Service: Orthopedics;  Laterality: Right;   REVISON OF ARTERIOVENOUS FISTULA Left 11/13/2023   Procedure: REVISON OF ARTERIOVENOUS FISTULA;  Surgeon: Sheree Penne Bruckner, MD;  Location: [facility] OR;  Service: Vascular;  Laterality: Left;   RIGHT/LEFT HEART CATH AND CORONARY ANGIOGRAPHY N/A 05/30/2022   Procedure: RIGHT/LEFT HEART CATH AND CORONARY ANGIOGRAPHY;  Surgeon: Dann Candyce RAMAN, MD;  Location: [facility] INVASIVE CV LAB;  Service: Cardiovascular;  Laterality: N/A;   TEE WITHOUT CARDIOVERSION N/A 06/05/2022   Procedure: TRANSESOPHAGEAL ECHOCARDIOGRAM (TEE);  Surgeon: Shyrl Linnie KIDD, MD;  Location: [facility] OR;   Service: Open Heart Surgery;  Laterality: N/A;   TOENAIL EXCISION     removed x2-bilateral   UMBILICAL HERNIA REPAIR  07/17/2005   [place]    Prior to Admission medications  Medication Sig Start Date End Date Taking? Authorizing Provider  acetaminophen  (TYLENOL ) 650 MG CR tablet Take 1,300 mg by mouth every 8 (eight) hours as needed for pain.    [provider]  albuterol  (VENTOLIN  HFA) 108 (90 Base) MCG/ACT inhaler Inhale 2 puffs into the lungs every 6 (six) hours as needed for wheezing or shortness of breath. 08/26/23   Pearlean Manus, MD  amLODipine  (NORVASC ) 5 MG tablet Take 1 tablet (  5 mg total) by mouth daily. 08/26/23   Pearlean Manus, MD  ascorbic acid  (VITAMIN C ) 500 MG tablet Take 1 tablet (500 mg total) by mouth daily. 05/07/23   Setzer, Nena PARAS, PA-C  aspirin  EC 81 MG tablet Take 81 mg by mouth daily. Swallow whole.    [provider]  atorvastatin  (LIPITOR ) 80 MG tablet Take 1 tablet (80 mg total) by mouth daily. 06/20/22   Dwan Kyla HERO, PA-C  benzonatate  (TESSALON ) 100 MG capsule Take 100 mg by mouth daily. 04/28/24   [provider]  calcitRIOL  (ROCALTROL ) 0.5 MCG capsule Take 0.5 mcg by mouth daily.    [provider]  carvedilol  (COREG ) 6.25 MG tablet Take 6.25 mg by mouth 2 (two) times daily. 03/27/24   [provider]  divalproex  (DEPAKOTE ) 250 MG DR tablet Take 250 mg by mouth 2 (two) times daily. 03/27/24   [provider]  esomeprazole  (NEXIUM ) 40 MG capsule Take 1 capsule (40 mg total) by mouth 2 (two) times daily before a meal. 07/16/23   Shirlean Therisa ORN, NP  ferric citrate (AURYXIA) 1 GM 210 MG(Fe) tablet Take 420 mg by mouth 3 (three) times daily with meals.    [provider]  Fluticasone -Umeclidin-Vilant (TRELEGY ELLIPTA ) 200-62.5-25 MCG/ACT AEPB Inhale 1 puff into the lungs daily. 11/22/23   Hope Almarie ORN, NP  loratadine  (CLARITIN ) 10 MG tablet Take 10 mg by mouth daily.    [provider]  midodrine  (PROAMATINE ) 10 MG tablet Take 10 mg by mouth 3 (three) times a week. Pablo [place], and Friday 12/26/23   [provider]  Multiple Vitamins-Minerals (MENS MULTIVITAMIN PO) Take 1 tablet by mouth daily.    [provider]  OVER THE COUNTER MEDICATION Take 2 tablets by mouth daily at 6 (six) AM.    [provider]  tamsulosin (FLOMAX) 0.4 MG CAPS capsule Take 0.4 mg by mouth daily after supper. 09/17/23   [provider]    Current Facility-Administered Medications  Medication Dose Route Frequency Provider Last Rate Last Admin   cefTRIAXone  (ROCEPHIN ) 2 g in sodium chloride  0.9 % 100 mL IVPB  2 g Intravenous Once Zelaya, Oscar A, PA-C       Current Outpatient Medications  Medication Sig Dispense Refill   acetaminophen  (TYLENOL ) 650 MG CR tablet Take 1,300 mg by mouth every 8 (eight) hours as needed for pain.     albuterol  (VENTOLIN  HFA) 108 (90 Base) MCG/ACT inhaler Inhale 2 puffs into the lungs every 6 (six) hours as needed for wheezing or shortness of breath. 18 g 11   amLODipine  (NORVASC ) 5 MG tablet Take 1 tablet (5 mg total) by mouth daily. 30 tablet 5   ascorbic acid  (VITAMIN C ) 500 MG tablet Take 1 tablet (500 mg total) by mouth daily. 100 tablet 0   aspirin  EC 81 MG tablet Take 81 mg by mouth daily. Swallow whole.     atorvastatin  (LIPITOR ) 80 MG tablet Take 1 tablet (80 mg total) by mouth daily. 30 tablet 1   benzonatate  (TESSALON ) 100 MG capsule Take 100 mg by mouth daily.     calcitRIOL  (ROCALTROL ) 0.5 MCG capsule Take 0.5 mcg by mouth daily.     carvedilol  (COREG ) 6.25 MG tablet Take 6.25 mg by mouth 2 (two) times daily.     divalproex  (DEPAKOTE ) 250 MG DR tablet Take 250 mg by mouth 2 (two) times daily.     esomeprazole  (NEXIUM ) 40 MG capsule Take 1 capsule (40 mg total)  by mouth 2 (two) times daily before a meal. 180 capsule 0   ferric citrate (AURYXIA) 1 GM 210 MG(Fe) tablet Take 420 mg by mouth 3 (three) times daily with meals.      Fluticasone -Umeclidin-Vilant (TRELEGY ELLIPTA ) 200-62.5-25 MCG/ACT AEPB Inhale 1 puff into the lungs daily. 28 each 5   loratadine  (CLARITIN ) 10 MG tablet Take 10 mg by mouth daily.     midodrine  (PROAMATINE ) 10 MG tablet Take 10 mg by mouth 3 (three) times a week. Mon, Wed, and Friday     Multiple Vitamins-Minerals (MENS MULTIVITAMIN PO) Take 1 tablet by mouth daily.     OVER THE COUNTER MEDICATION Take 2 tablets by mouth daily at 6 (six) AM.     tamsulosin (FLOMAX) 0.4 MG CAPS capsule Take 0.4 mg by mouth daily after supper.      Allergies as of 06/27/2024 - Review Complete 06/27/2024  Allergen Reaction Noted   Tramadol  Itching 07/10/2011   Opana [oxymorphone hcl] Itching 01/15/2014    Family History  Problem Relation Age of Onset   Hypertension Mother        MI   Cancer Mother        breast    Diabetes Mother    Diabetes Father    Hypertension Father    Hypertension Sister    Diabetes Sister    Arthritis Other    Asthma Other    Lung disease Other    Anesthesia problems Neg Hx    Hypotension Neg Hx    Malignant hyperthermia Neg Hx    Pseudochol deficiency Neg Hx    Colon cancer Neg Hx     Social History   Socioeconomic History   Marital status: Married    Spouse name: Not on file   Number of children: 2   Years of education: 12th grade   Highest education level: Not on file  Occupational History   Occupation: disabled   Occupation:      Associate Professor: UNEMPLOYED  Tobacco Use   Smoking status: Former    Current packs/day: 0.00    Average packs/day: 1 pack/day for 25.0 years (25.0 ttl pk-yrs)    Types: Cigarettes    Start date: 03/27/1985    Quit date: 03/27/2010    Years since quitting: 14.2   Smokeless tobacco: Never   Tobacco comments:    Quit in 2011  Vaping Use   Vaping status: Never Used  Substance and Sexual Activity   Alcohol  use: Yes    Comment: occasionally liquor   Drug use: Yes    Types: Marijuana    Comment: cocaine- last time used- 11/24/2017 ,  marijuana- last use 11/10/23 withhold 24 hrs prior to procedure   Sexual activity: Yes    Birth control/protection: None  Other Topics Concern   Not on file  Social History Narrative   He quit smoking in 2010. 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 He developed pulmonary problems, became disabled because of lower airway disease in 2009.       WATCHES BASKETBALL. HIS TEAM IS [name].   Social Drivers of Health   Tobacco Use: Medium Risk (06/27/2024)   Patient History    Smoking Tobacco Use: Former    Smokeless Tobacco Use: Never    Passive Exposure: Not on file  Financial Resource Strain: Low Risk  (03/05/2023)   Received from [facility]   Overall Financial Resource Strain (CARDIA)    Difficulty of Paying Living  Expenses: Not hard at all  Food Insecurity: No Food Insecurity (08/25/2023)   Hunger Vital Sign    Worried About Running Out of Food in the Last Year: Never true    Ran Out of Food in the Last Year: Never true  Transportation Needs: No Transportation Needs (08/25/2023)   PRAPARE - Administrator, Civil Service (Medical): No    Lack of Transportation (Non-Medical): No  Physical Activity: Sufficiently Active (12/29/2021)   Exercise Vital Sign    Days of Exercise per Week: 3 days    Minutes of Exercise per Session: 60 min  Stress: Patient Declined (03/12/2023)   Received from [facility]   [employer] of Occupational Health - Occupational Stress Questionnaire    Feeling of Stress : Patient declined  Social Connections: Socially Integrated (08/25/2023)   Social Connection and Isolation Panel    Frequency of Communication with Friends and Family: More than three times a week    Frequency of Social Gatherings with Friends and Family: More than three times a week    Attends Religious Services: More than 4 times per year    Active Member of [employer] or Organizations: Yes    Attends Banker Meetings: More than  4 times per year    Marital Status: Married  Catering Manager Violence: Not At Risk (08/25/2023)   Humiliation, Afraid, Rape, and Kick questionnaire    Fear of Current or Ex-Partner: No    Emotionally Abused: No    Physically Abused: No    Sexually Abused: No  Depression (PHQ2-9): Medium Risk (05/28/2023)   Depression (PHQ2-9)    PHQ-2 Score: 9  Alcohol  Screen: Low Risk (12/29/2021)   Alcohol  Screen    Last Alcohol  Screening Score (AUDIT): 0  Housing: Low Risk (08/25/2023)   Housing Stability Vital Sign    Unable to Pay for Housing in the Last Year: No    Number of Times Moved in the Last Year: 0    Homeless in the Last Year: No  Utilities: Not At Risk (08/25/2023)   AHC Utilities    Threatened with loss of utilities: No  Health Literacy: Not on file     Review of Systems   Gen: Denies any fever, chills, loss of appetite, change in weight or weight loss CV: Denies chest pain, heart palpitations, syncope, edema  Resp: baseline mild dyspnea. Denies cough, wheezing, coughing up blood, and pleurisy. GI: see HPI GU : Denies urinary burning, blood in urine, urinary frequency, and urinary incontinence. MS: Denies joint pain, limitation of movement, swelling, cramps, and atrophy.  Psych: Denies depression, anxiety, memory loss, hallucinations, and confusion. Heme: Denies bruising or bleeding Neuro:  Denies any headaches, dizziness, paresthesias, shaking  Physical Exam   Vital Signs in last 24 hours: Temp:  [99 F (37.2 C)] 99 F (37.2 C) (12/12 1103) Pulse Rate:  [60] 60 (12/12 1103) Resp:  [18] 18 (12/12 1103) BP: (104)/(65) 104/65 (12/12 1103) SpO2:  [93 %] 93 % (12/12 1103) Weight:  [96.6 kg] 96.6 kg (12/12 1103)    General:   Alert, pleasant and cooperative in NAD Head:  Normocephalic and atraumatic. Eyes:  Sclera clear, no icterus.   Conjunctiva pink. Ears:  Normal auditory acuity. Mouth:  No deformity or lesions, dentition normal. Neck:  Supple; no masses Abdomen:   Soft, mildly distended. Ttp to mid lower abdomen and LLQ. + fkuid wave. Normal bowel sounds, without guarding, and without rebound.   Rectal: deferred   Msk:  Symmetrical without gross deformities. Normal posture. Neurologic:  Alert and  oriented x4. Skin:  Intact without significant lesions or rashes. Psych:  Alert and cooperative. Normal mood and affect.  Intake/Output from previous day: No intake/output data recorded. Intake/Output this shift: No intake/output data recorded.   Labs/Studies   Recent Labs Recent Labs    06/27/24 1120  WBC 4.5  HGB 12.1*  HCT 39.1  PLT 229   BMET Recent Labs    06/27/24 1120  NA 140  K 3.6  CL 99  CO2 26  GLUCOSE 102*  BUN 19  CREATININE 6.25*  CALCIUM  9.1   LFT Recent Labs    06/27/24 1120  PROT 7.5  ALBUMIN  3.4*  AST 45*  ALT 64*  ALKPHOS 102  BILITOT 0.5   PT/INR No results for input(s): LABPROT, INR in the last 72 hours. Hepatitis Panel No results for input(s): HEPBSAG, HCVAB, HEPAIGM, HEPBIGM in the last 72 hours. C-Diff No results for input(s): CDIFFTOX in the last 72 hours.  Radiology/Studies IR Paracentesis Result Date: 06/26/2024 INDICATION: new onset ascites 73 year old male with history of GERD, or hemodialysis, previously on peritoneal dialysis, cirrhosis, now with ascites. IR is requested for diagnostic and therapeutic paracentesis. 4 L maximum. Albumin  requested. EXAM: ULTRASOUND GUIDED DIAGNOSTIC AND THERAPEUTIC PARACENTESIS MEDICATIONS: 5 cc of 1% lidocaine . COMPLICATIONS: None immediate. PROCEDURE: Informed written consent was obtained from the patient after a discussion of the risks, benefits and alternatives to treatment. A timeout was performed prior to the initiation of the procedure. Initial ultrasound scanning demonstrates a large amount of ascites within the right lower abdominal quadrant. The right lower abdomen was prepped and draped in the usual sterile fashion. 1% lidocaine  was used  for local anesthesia. Following this, a 19 gauge, 7-cm, Yueh catheter was introduced. An ultrasound image was saved for documentation purposes. The paracentesis was performed. The catheter was removed and a dressing was applied. The patient tolerated the procedure well without immediate post procedural complication. Patient was educated on and offered post-procedure intravenous albumin , as per order request. Patient did refuse albumin  at this time. See nursing notes in Epic for details. FINDINGS: A total of approximately 4.0 L of clear, straw-colored is peritoneal fluid was removed. Samples were sent to the laboratory as requested by the clinical team. IMPRESSION: Successful ultrasound-guided paracentesis yielding 4.0 L of peritoneal fluid. Performed by: Carlin Griffon, PA-C PLAN: If the patient eventually requires >/=2 paracenteses in a 30 day period, candidacy for formal evaluation by the [facility]iology [facility] will be assessed. Thom Hall, MD [facility] Radiology Electronically Signed   By: Thom Hall M.D.   On: 06/26/2024 12:32     Assessment   [name] [name] is a 73 y.o. year old male with history of GERD, anemia which is multifactorial with chronic disease/IDA, previously on peritoneal dialysis however had previous intra-abdominal infection from peritoneal dialysis catheter and now currently on HD, cirrhosis, gallbladder polyp, severe aortic stenosis, CAD s/p CABG, diabetes, COPD, HTN, HLD, OSA, and CHF who presented to the ED at the recommendation of his primary GI provider Therisa Stager, NP given positive cell count on recent paracentesis and recurrent abdominal pain.  Decompensated cirrhosis and SBP: - Recent ascites as well as tenderness during office visit on 12/9 - Underwent paracentesis yesterday with removal of 4 L (max of 4 L removal recommended) - Ascitic fluid cell count with PMNs > 250 - Patient  called with results yesterday evening and was  recommended to follow-up in the ED however he decided to wait until this morning given yesterday he was not having any abdominal pain but this morning presented with recurrent abdominal pain -Remains tender to the LLQ with palpation, mildly distended still. - Hepatoma screening up-to-date with ultrasound in September 2025 without evidence of HCC and had a patent portal vasculature.  aFP up-to-date. - Ongoing needs for EGD for variceal screening but not candidate for procedure until aortic stenosis is addressed.  Given he is symptomatic with abdominal pain and positive cell count this classifies as SBP therefore he will be treated with IV ceftriaxone  2 g daily for total 5 days as well as receive replacement of albumin  today and day 3   Plan / Recommendations   IV ceftriaxone  (Rocephin ) 2 g every 24 hours for 5 days Albumin  25% 100 g today and on day 3 Hold carvedilol , after initial treatment with IV ceftriazone if symptoms improve then can resume NSBB Trend LFTs and renal function Follow-up pending ascitic culture If no improvement of abdominal pain in 48 hours then repeat with diagnostic paracentesis Given positive cell count, he will need to be maintained on indefinite SBP prophylaxis on discharge (renally doses Cipro  or bactrim ) 2 g sodium diet Follow-up blood cultures  After discussion with hospitalist and EDP there was a conversation with nephrology and ID that patient could receive IV antibiotics with dialysis therefore their plan is to discharge. Made it clear to hospitalist and ED team that they will need to also coordinate receiving 100g of albumin  on Sunday or first thing Monday as well for appropriate SBP treatment.    06/27/2024, 12:57 PM  Charmaine Melia, MSN, FNP-BC, AGACNP-BC [facility]

## 2024-06-27 NOTE — Progress Notes (Addendum)
 Contacted by MD regarding iv abx needs with HD at d/c. Contacted [facility] to inform of cefepime  2g w HD until 12/19. Dosage and info provided. They have this at the clinic and can give at next tx. Provider informed.   Eylin Pontarelli Dialyiss Nav [phone]

## 2024-06-28 LAB — ACID FAST SMEAR (AFB, MYCOBACTERIA): Acid Fast Smear: NEGATIVE

## 2024-07-01 ENCOUNTER — Telehealth: Payer: Self-pay

## 2024-07-01 LAB — CULTURE, BODY FLUID W GRAM STAIN -BOTTLE: Culture: NO GROWTH

## 2024-07-01 NOTE — Telephone Encounter (Signed)
 Returned the pt's call regarding vm (pain medication).  Phoned the pt and he was asking for pain meds I advised we do not give out pain meds. He stated he had to leave dialysis yesterday because where the infection is in his abdomen is very painful. The pt was advised to look at his medication list where he can take Tylenol  650mg  X2. Pt argued for 10 minutes that he does not have it on his MyChart. Finally he found it on his list. Please call this pt because I think you need to tell him.

## 2024-07-01 NOTE — Telephone Encounter (Signed)
 I personally called and spoke to patient.   He has no abdominal pain currently. Took 650 mg Tylenol . He has chronic abdominal pain at site of prior peritoneal catheter location.  I discussed he is to have no more than 2 grams of tylenol  in a 24 hour period. This would mean no more than 3 tablets of the 650 mg in a 24 hour period.   US  abdomen complete upcoming on 12/18.     Ladonna: He will be coming on 12/18 at 130pm to see me. Please put him in the held slot. Thanks!

## 2024-07-02 ENCOUNTER — Other Ambulatory Visit: Payer: Self-pay | Admitting: Primary Care

## 2024-07-02 LAB — CULTURE, BLOOD (ROUTINE X 2)
Culture: NO GROWTH
Culture: NO GROWTH
Special Requests: ADEQUATE
Special Requests: ADEQUATE

## 2024-07-03 ENCOUNTER — Ambulatory Visit (HOSPITAL_COMMUNITY): Admission: RE | Admit: 2024-07-03 | Source: Ambulatory Visit

## 2024-07-03 ENCOUNTER — Ambulatory Visit: Admitting: Gastroenterology

## 2024-07-03 ENCOUNTER — Encounter: Payer: Self-pay | Admitting: Gastroenterology

## 2024-07-03 VITALS — BP 129/71 | HR 71 | Temp 97.5°F | Ht 64.0 in | Wt 216.4 lb

## 2024-07-03 DIAGNOSIS — R188 Other ascites: Secondary | ICD-10-CM | POA: Diagnosis not present

## 2024-07-03 DIAGNOSIS — K7460 Unspecified cirrhosis of liver: Secondary | ICD-10-CM | POA: Diagnosis not present

## 2024-07-03 DIAGNOSIS — K652 Spontaneous bacterial peritonitis: Secondary | ICD-10-CM | POA: Diagnosis not present

## 2024-07-03 NOTE — Progress Notes (Signed)
 "   Gastroenterology Office Note     Primary Care Physician:  Carlette Benita Area, MD  Primary Gastroenterologist: Dr Cindie   Chief Complaint   Chief Complaint  Patient presents with   Follow-up     History of Present Illness   AADITYA [name] is a 73 y.o. male presenting today with a history of GERD, multifactorial anemia with chronic disease/IDA, previously on peritoneal dialysis complicated by previous intra-abdominal infection from peritoneal dialysis catheter and now currently on HD, cirrhosis, gallbladder polyp, severe aortic stenosis, CAD s/p CABG, diabetes, COPD, HTN, HLD, OSA, and CHF, recently diagnosed with SBP as outpatient last week. (06/26/24).   He received outpatient IV antibiotics with dialysis and is returning today for early interval follow-up. Initial PMNs were 629. Cultures no growth. Cytology negative for malignancy. AFP smear negative. Blood cultures in ED negative.   He had cefepime  2 g IV on Monday, Wednesday, and Friday, along with dose of albumin  1g.kg on Monday. He is here to discuss prophylaxis. He did not get the IV antibiotic on Wednesday. The IV was pulled out too soon. He will be having antibiotic course extended through Monday and done at dialysis.   Feels very full after eating or drinking.   Left sided pain feels better after massaging. This is chronic. It is worsened when he has abdominal distension due to ascites. Present at site where PD catheter had been previously.   No added salt. Wife will add some salt in the meal when cooking but he doesn't add anything.   No overt GI bleeding. No confusion or mental status changes. He feels fluid has accumulated again.   Jul 03, 2024 US  abdomen complete Cirrhosis with moderate asocites 2 small left renal cysts Normal spleen Portal vein patent.   AFP tumor marker normal in Nov 2025.  Acute hepatitis panel negative.  Hep C negative Sept 2024. Thorough serologies with ASMA very weakly positive  but IgG, IgM normal. No iron  overload. Ferritin elevated. ANA, AMA negative, ceruloplasmin level elevated and suspect acute reactant, unable to collect 24 hour urine copper as anuric.  Last MELD 3.0 was 19, driven by creatinine.   Endoscopic procedures:  Hx of hyperplastic polyps, colonoscopy 2026   Needs EGD for variceal screening but not candidate at this time due to severe aortic stenosis   Past Medical History:  Diagnosis Date   Anemia    Arthritis    Asthma    Bell palsy    left side of face- comes and goes per pt on 11/12/23   CAD (coronary artery disease)    a. 2014 MV: abnl w/ infap ischemia; b. 03/2013 Cath: aneurysmal bleb in the LAD w/ otw nonobs dzs-->Med Rx.   Chronic back pain    Chronic kidney disease    on dialysis - mon wed fri   Chronic knee pain    a. 09/2015 s/p R TKA.   Chronic pain    Chronic shoulder pain    Chronic sinusitis    COPD (chronic obstructive pulmonary disease) (HCC)    Diabetes mellitus without complication (HCC)    type II , no meds, does not check blood sugar   Dyspnea    with exertion   Dysrhythmia    2nd degree AV block   ESRD on peritoneal dialysis (HCC)    on peritoneal dialysis, [facility] - mon wed fri   Essential hypertension    GERD (gastroesophageal reflux disease)    Gout    Hepatomegaly  noted on noncontrast CT 2015   History of hiatal hernia    Hyperlipidemia    Hypothyroidism    Lateral meniscus tear    Myocardial infarction (HCC) 07/2021   Neuromuscular disorder (HCC)    Obesity    Truncal   Obstructive sleep apnea    does not use cpap    On home oxygen  therapy    uses 2L when needed   Pneumonia    PUD (peptic ulcer disease)    remote, reports f/u EGD about 8 years ago unremarkable    Reactive airway disease    related to exposure to chemical during 9/11   Sinusitis    Substance abuse (HCC)    hx in past   Vitamin D  deficiency     Past Surgical History:  Procedure Laterality Date   A/V  FISTULAGRAM N/A 02/28/2023   Procedure: A/V Fistulagram;  Surgeon: Lanis Fonda BRAVO, MD;  Location: [facility] INVASIVE CV LAB;  Service: Cardiovascular;  Laterality: N/A;   A/V FISTULAGRAM N/A 10/22/2023   Procedure: A/V Fistulagram;  Surgeon: Sheree Penne Bruckner, MD;  Location: [facility] INVASIVE CV LAB;  Service: Cardiovascular;  Laterality: N/A;   ASAD LT SHOULDER  12/15/2008   left shoulder   AV FISTULA PLACEMENT Left 08/09/2016   Procedure: BRACHIOCEPHALIC ARTERIOVENOUS (AV) FISTULA CREATION LEFT ARM;  Surgeon: Carlin BRAVO Haddock, MD;  Location: [facility] OR;  Service: Vascular;  Laterality: Left;   BASCILIC VEIN TRANSPOSITION Left 11/13/2023   Procedure: TRANSPOSITION, VEIN, BASILIC;  Surgeon: Sheree Penne Bruckner, MD;  Location: [facility] OR;  Service: Vascular;  Laterality: Left;   CAPD INSERTION N/A 10/07/2018   Procedure: LAPAROSCOPIC PERITONEAL CATHETER PLACEMENT;  Surgeon: Stevie Herlene Righter, MD;  Location: [facility] ORS;  Service: General;  Laterality: N/A;   CATARACT EXTRACTION W/PHACO Left 03/28/2016   Procedure: CATARACT EXTRACTION PHACO AND INTRAOCULAR LENS PLACEMENT LEFT EYE;  Surgeon: Oneil Platts, MD;  Location: [facility] ORS;  Service: Ophthalmology;  Laterality: Left;  CDE: 4.77   CATARACT EXTRACTION W/PHACO Right 04/11/2016   Procedure: CATARACT EXTRACTION PHACO AND INTRAOCULAR LENS PLACEMENT RIGHT EYE; CDE:  4.74;  Surgeon: Oneil Platts, MD;  Location: [facility] ORS;  Service: Ophthalmology;  Laterality: Right;   COLONOSCOPY  10/15/2008   Fields: Rectal polyp obliterated, not retrieved, hemorrhoids, single ascending colon diverticulum near the CV. Next colonoscopy April 2020   COLONOSCOPY N/A 12/25/2014   SLF: 1. Colorectal polyps (2) removed 2. Small internal hemorrhoids 3. the left colon is severely redundant. hyperplastic polyps   CORONARY ARTERY BYPASS GRAFT N/A 06/05/2022   Procedure: OFF PUMP CORONARY ARTERY BYPASS GRAFTING (CABG) X 2 BYPASSES USING LEFT INTERNAL MAMMARY ARTERY AND RIGHT LEG GREATER  SAPHENOUS VEIN HARVESTED ENDOSCOPICALLY;  Surgeon: Shyrl Linnie KIDD, MD;  Location: [facility] OR;  Service: Open Heart Surgery;  Laterality: N/A;   CORONARY STENT INTERVENTION N/A 07/25/2021   Procedure: CORONARY STENT INTERVENTION;  Surgeon: Wonda Sharper, MD;  Location: [facility] INVASIVE CV LAB;  Service: Cardiovascular;  Laterality: N/A;   CORONARY STENT INTERVENTION N/A 12/26/2021   Procedure: CORONARY STENT INTERVENTION;  Surgeon: Mady Bruckner, MD;  Location: [facility] INVASIVE CV LAB;  Service: Cardiovascular;  Laterality: N/A;   CORONARY STENT INTERVENTION N/A 01/20/2022   Procedure: CORONARY STENT INTERVENTION;  Surgeon: Wonda Sharper, MD;  Location: [facility] INVASIVE CV LAB;  Service: Cardiovascular;  Laterality: N/A;   DOPPLER ECHOCARDIOGRAPHY     ESOPHAGOGASTRODUODENOSCOPY N/A 12/25/2014   SLF: 1. Anemia most likely due to CRI, gastritis, gastric polyps 2. Moderate non-erosive gastriits and mild duodenitis.  3.TWo large gstric polyps removed.    EYE SURGERY  12/22/2010   tear duct probing-[place]   FOREIGN BODY REMOVAL  03/29/2011   Procedure: REMOVAL FOREIGN BODY EXTREMITY;  Surgeon: Taft Minerva, MD;  Location: [facility]S;  Service: Orthopedics;  Laterality: Right;  Removal Foreign Body Right Thumb   INSERTION OF DIALYSIS CATHETER N/A 11/13/2023   Procedure: INSERTION OF DIALYSIS CATHETER;  Surgeon: Sheree Penne Bruckner, MD;  Location: [facility] OR;  Service: Vascular;  Laterality: N/A;   IR FLUORO GUIDE CV LINE RIGHT  08/06/2018   IR FLUORO GUIDE CV LINE RIGHT  02/17/2023   IR PARACENTESIS  06/26/2024   IR US  GUIDE VASC ACCESS RIGHT  08/06/2018   IR US  GUIDE VASC ACCESS RIGHT  02/17/2023   KNEE ARTHROSCOPY  10/16/2007   left   KNEE ARTHROSCOPY WITH LATERAL MENISECTOMY Right 10/14/2015   Procedure: LEFT KNEE ARTHROSCOPY WITH PARTIAL LATERAL MENISECTOMY;  Surgeon: Taft FORBES Minerva, MD;  Location: [facility];  Service: Orthopedics;  Laterality: Right;   LEFT HEART CATH AND CORONARY ANGIOGRAPHY N/A  07/25/2021   Procedure: LEFT HEART CATH AND CORONARY ANGIOGRAPHY;  Surgeon: Wonda Sharper, MD;  Location: [facility] INVASIVE CV LAB;  Service: Cardiovascular;  Laterality: N/A;   LEFT HEART CATH AND CORONARY ANGIOGRAPHY N/A 12/26/2021   Procedure: LEFT HEART CATH AND CORONARY ANGIOGRAPHY;  Surgeon: Mady Bruckner, MD;  Location: [facility] INVASIVE CV LAB;  Service: Cardiovascular;  Laterality: N/A;   LEFT HEART CATH AND CORONARY ANGIOGRAPHY N/A 01/20/2022   Procedure: LEFT HEART CATH AND CORONARY ANGIOGRAPHY;  Surgeon: Wonda Sharper, MD;  Location: [facility] INVASIVE CV LAB;  Service: Cardiovascular;  Laterality: N/A;   LEFT HEART CATHETERIZATION WITH CORONARY ANGIOGRAM N/A 03/28/2013   Procedure: LEFT HEART CATHETERIZATION WITH CORONARY ANGIOGRAM;  Surgeon: Alm LELON Clay, MD;  Location: [facility] CATH LAB;  Service: Cardiovascular;  Laterality: N/A;   NM MYOVIEW  LTD     PENILE PROSTHESIS IMPLANT N/A 08/16/2015   Procedure: PENILE PROTHESIS INFLATABLE, three piece, Excisional biopsy of Penile ulcer, Penile molding;  Surgeon: Arlena Gal, MD;  Location: [facility]S;  Service: Urology;  Laterality: N/A;   PENILE PROSTHESIS IMPLANT N/A 12/24/2017   Procedure: REMOVAL AND  REPLACEMENT  COLOPLAST PENILE PROSTHESIS;  Surgeon: Carolee Sherwood JONETTA DOUGLAS, MD;  Location: [facility] ORS;  Service: Urology;  Laterality: N/A;   PERIPHERAL VASCULAR BALLOON ANGIOPLASTY  02/28/2023   Procedure: PERIPHERAL VASCULAR BALLOON ANGIOPLASTY;  Surgeon: Lanis Fonda FORBES, MD;  Location: [facility] INVASIVE CV LAB;  Service: Cardiovascular;;   PORT-A-CATH REMOVAL N/A 02/19/2023   Procedure: REMOVAL OF PERITONEAL DIALYSIS CATHETER;  Surgeon: Lanis Fonda FORBES, MD;  Location: [facility] OR;  Service: Vascular;  Laterality: N/A;   QUADRICEPS TENDON REPAIR  07/21/2011   Procedure: REPAIR QUADRICEP TENDON;  Surgeon: Taft Minerva, MD;  Location: [facility];  Service: Orthopedics;  Laterality: Right;   REVISON OF ARTERIOVENOUS FISTULA Left 11/13/2023   Procedure: REVISON OF  ARTERIOVENOUS FISTULA;  Surgeon: Sheree Penne Bruckner, MD;  Location: [facility] OR;  Service: Vascular;  Laterality: Left;   RIGHT/LEFT HEART CATH AND CORONARY ANGIOGRAPHY N/A 05/30/2022   Procedure: RIGHT/LEFT HEART CATH AND CORONARY ANGIOGRAPHY;  Surgeon: Dann Candyce RAMAN, MD;  Location: [facility] INVASIVE CV LAB;  Service: Cardiovascular;  Laterality: N/A;   TEE WITHOUT CARDIOVERSION N/A 06/05/2022   Procedure: TRANSESOPHAGEAL ECHOCARDIOGRAM (TEE);  Surgeon: Shyrl Linnie KIDD, MD;  Location: [facility] OR;  Service: Open Heart Surgery;  Laterality: N/A;   TOENAIL EXCISION     removed x2-bilateral   UMBILICAL HERNIA REPAIR  07/17/2005   [place]    Current Outpatient Medications  Medication Sig Dispense Refill   acetaminophen  (TYLENOL ) 650 MG CR tablet Take 1,300 mg by mouth every 8 (eight) hours as needed for pain.     albuterol  (VENTOLIN  HFA) 108 (90 Base) MCG/ACT inhaler Inhale 2 puffs into the lungs every 6 (six) hours as needed for wheezing or shortness of breath. 18 g 11   amLODipine  (NORVASC ) 5 MG tablet Take 1 tablet (5 mg total) by mouth daily. 30 tablet 5   ascorbic acid  (VITAMIN C ) 500 MG tablet Take 1 tablet (500 mg total) by mouth daily. 100 tablet 0   aspirin  EC 81 MG tablet Take 81 mg by mouth daily. Swallow whole.     atorvastatin  (LIPITOR ) 80 MG tablet Take 1 tablet (80 mg total) by mouth daily. 30 tablet 1   benzonatate  (TESSALON ) 100 MG capsule Take 100 mg by mouth daily.     calcitRIOL  (ROCALTROL ) 0.5 MCG capsule Take 0.5 mcg by mouth daily.     carvedilol  (COREG ) 6.25 MG tablet Take 6.25 mg by mouth 2 (two) times daily.     ceFEPime  (MAXIPIME ) IVPB Inject 2 g into the vein every Monday, Wednesday, and Friday with hemodialysis for 4 days. Indication:  SBP  First Dose: Yes Last Day of Therapy:  07/04/24     divalproex  (DEPAKOTE ) 250 MG DR tablet Take 250 mg by mouth 2 (two) times daily.     esomeprazole  (NEXIUM ) 40 MG capsule Take 1 capsule (40 mg total) by mouth 2 (two) times  daily before a meal. 180 capsule 0   ferric citrate  (AURYXIA ) 1 GM 210 MG(Fe) tablet Take 420 mg by mouth 3 (three) times daily with meals.     loratadine  (CLARITIN ) 10 MG tablet Take 10 mg by mouth daily.     midodrine  (PROAMATINE ) 10 MG tablet Take 10 mg by mouth 3 (three) times a week. Mon, Wed, and Friday     Multiple Vitamins-Minerals (MENS MULTIVITAMIN PO) Take 1 tablet by mouth daily.     OVER THE COUNTER MEDICATION Take 2 tablets by mouth daily at 6 (six) AM.     tamsulosin  (FLOMAX ) 0.4 MG CAPS capsule Take 0.4 mg by mouth daily after supper.     TRELEGY ELLIPTA  200-62.5-25 MCG/ACT AEPB INHALE ONE PUFF INTO THE LUNGS ONCE DAILY 60 each 5   No current facility-administered medications for this visit.    Allergies as of 07/03/2024 - Review Complete 07/03/2024  Allergen Reaction Noted   Tramadol  Itching 07/10/2011   Opana [oxymorphone hcl] Itching 01/15/2014    Family History  Problem Relation Age of Onset   Hypertension Mother        MI   Cancer Mother        breast    Diabetes Mother    Diabetes Father    Hypertension Father    Hypertension Sister    Diabetes Sister    Arthritis Other    Asthma Other    Lung disease Other    Anesthesia problems Neg Hx    Hypotension Neg Hx    Malignant hyperthermia Neg Hx    Pseudochol deficiency Neg Hx    Colon cancer Neg Hx     Social History   Socioeconomic History   Marital status: Married    Spouse name: Not on file   Number of children: 2   Years of education: 12th grade   Highest education level: Not on file  Occupational History   Occupation:  disabled   Occupation:      Associate Professor: UNEMPLOYED  Tobacco Use   Smoking status: Former    Current packs/day: 0.00    Average packs/day: 1 pack/day for 25.0 years (25.0 ttl pk-yrs)    Types: Cigarettes    Start date: 03/27/1985    Quit date: 03/27/2010    Years since quitting: 14.2   Smokeless tobacco: Never   Tobacco comments:    Quit in 2011  Vaping Use   Vaping  status: Never Used  Substance and Sexual Activity   Alcohol  use: Yes    Comment: occasionally liquor   Drug use: Yes    Types: Marijuana    Comment: cocaine- last time used- 11/24/2017 , marijuana- last use 11/10/23 withhold 24 hrs prior to procedure   Sexual activity: Yes    Birth control/protection: None  Other Topics Concern   Not on file  Social History Narrative   He quit smoking in 2010. 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 He developed pulmonary problems, became disabled because of lower airway disease in 2009.       WATCHES BASKETBALL. HIS TEAM IS [place].   Social Drivers of Health   Tobacco Use: Medium Risk (07/03/2024)   Patient History    Smoking Tobacco Use: Former    Smokeless Tobacco Use: Never    Passive Exposure: Not on file  Financial Resource Strain: Low Risk  (03/05/2023)   Received from [facility]   Overall Financial Resource Strain (CARDIA)    Difficulty of Paying Living Expenses: Not hard at all  Food Insecurity: No Food Insecurity (08/25/2023)   Hunger Vital Sign    Worried About Running Out of Food in the Last Year: Never true    Ran Out of Food in the Last Year: Never true  Transportation Needs: No Transportation Needs (08/25/2023)   PRAPARE - Administrator, Civil Service (Medical): No    Lack of Transportation (Non-Medical): No  Physical Activity: Sufficiently Active (12/29/2021)   Exercise Vital Sign    Days of Exercise per Week: 3 days    Minutes of Exercise per Session: 60 min  Stress: Patient Declined (03/12/2023)   Received from [facility]   [employer] of Occupational Health - Occupational Stress Questionnaire    Feeling of Stress : Patient declined  Social Connections: Socially Integrated (08/25/2023)   Social Connection and Isolation Panel    Frequency of Communication with Friends and Family: More than three times a week    Frequency of Social Gatherings with Friends and  Family: More than three times a week    Attends Religious Services: More than 4 times per year    Active Member of Clubs or Organizations: Yes    Attends Banker Meetings: More than 4 times per year    Marital Status: Married  Catering Manager Violence: Not At Risk (08/25/2023)   Humiliation, Afraid, Rape, and Kick questionnaire    Fear of Current or Ex-Partner: No    Emotionally Abused: No    Physically Abused: No    Sexually Abused: No  Depression (PHQ2-9): Medium Risk (05/28/2023)   Depression (PHQ2-9)    PHQ-2 Score: 9  Alcohol  Screen: Low Risk (12/29/2021)   Alcohol  Screen    Last Alcohol  Screening Score (AUDIT): 0  Housing: Low Risk (08/25/2023)   Housing Stability Vital Sign    Unable to Pay for Housing in the Last Year: No    Number  of Times Moved in the Last Year: 0    Homeless in the Last Year: No  Utilities: Not At Risk (08/25/2023)   AHC Utilities    Threatened with loss of utilities: No  Health Literacy: Not on file     Review of Systems   Gen: Denies any fever, chills, fatigue, weight loss, lack of appetite.  CV: Denies chest pain, heart palpitations, peripheral edema, syncope.  Resp: Denies shortness of breath at rest or with exertion. Denies wheezing or cough.  GI: Denies dysphagia or odynophagia. Denies jaundice, hematemesis, fecal incontinence. GU : Denies urinary burning, urinary frequency, urinary hesitancy MS: Denies joint pain, muscle weakness, cramps, or limitation of movement.  Derm: Denies rash, itching, dry skin Psych: Denies depression, anxiety, memory loss, and confusion Heme: Denies bruising, bleeding, and enlarged lymph nodes.   Physical Exam   BP 129/71   Pulse 71   Temp (!) 97.5 F (36.4 C) (Temporal)   Ht 5' 4 (1.626 m)   Wt 216 lb 6.4 oz (98.2 kg)   SpO2 97%   BMI 37.14 kg/m  General:   Alert and oriented. Pleasant and cooperative. Well-nourished and well-developed.  Head:  Normocephalic and atraumatic. Eyes:  Without  icterus Abdomen:  +BS, distended but not completely tense, obvious ascites, no TTP Rectal:  Deferred  Msk:  Symmetrical without gross deformities. Normal posture. Extremities:  Without edema. Neurologic:  Alert and  oriented x4 Skin:  Intact without significant lesions or rashes. Psych:  Alert and cooperative. Normal mood and affect.   Assessment   [name] [name] is a 73 y.o. male presenting today with a history of  GERD, multifactorial anemia with chronic disease/IDA, previously on peritoneal dialysis complicated by previous intra-abdominal infection from peritoneal dialysis catheter and now currently on HD, cirrhosis, gallbladder polyp, severe aortic stenosis, CAD s/p CABG, diabetes, COPD, HTN, HLD, OSA, and CHF, recently diagnosed with SBP as outpatient last week. (06/26/24).   SBP: history of culture negative peritonitis due to PD catheter in Aug 2024 with fungemia and bacteremia, hospitalized at [facility]. Now with recurrent ascites early December and found to again have culture negative fluid with peritonitis and treated with IV antibiotics as outpatient with dialysis per ID recommendations. Blood cultures negative in ED, clinically improved today and notes only slight left-sided discomfort that is chronic and has been present in the past at site of PD catheter. As appears to be dealing with culture-negative neutrocytic ascites, I'm repeating para for cell count and culture on Tuesday. We are avoiding paras on same day as dialysis. Limited options with para capabilities here in [place], so Tuesday is the earliest. Continue with completion of IV antibiotics with dialysis to completion.    Decompensated cirrhosis: suspect etiology of cirrhosis multifactorial in setting of MASH, possible congestive hepatopathy in light of hx, have discussed biopsy in past but not a candidate for this in light of multimorbidities, needs EGD for variceal screening but again not an anesthesia candidate with severe  aortic stenosis.         PLAN   US  para on Tuesday due to scheduling conflicts. Rechecking cell count, culture, needs IV albumin  12.5 g  Complete the IV antibiotic course. As missed yesterday's dose, he will have it Friday and the following Monday.  Decrease Nexium  to once daily for the next week then off and see how he does. Trying to avoid PPI use if possible in light of SBP Will discuss with Nephrology ongoing secondary prophylaxis for SBP Return in  4-6 weeks    Therisa MICAEL Stager, PhD, ANP-BC [facility]   ADDENDUM: repeat para with increased WBC count, PMN now greater than 1000, no growth thus far on fluid culture. Gram stain without organisms. Discussed with patient presenting to the ED due to culture negative neutrocytic ascites. He will need IV abx, albumin , and repeat para in 24-48 hours. Recommend strongly to involve ID due to refractory presentation.   Therisa MICAEL Stager, PhD, ANP-BC [facility]   "

## 2024-07-03 NOTE — Patient Instructions (Addendum)
 We are arranging the fluid removal in the near future.   It is important you stick to no more than 2 grams of sodium a day. I have included a handout.  You may take tylenol  as needed, but no more than 2000 milligrams a day (2grams in 24 hour period).  If you have any worsening symptoms, please go to the emergency room.  We will see you back in 4-6 weeks!   Have a good Christmas!  Therisa MICAEL Stager, PhD, ANP-BC [facility]

## 2024-07-04 ENCOUNTER — Telehealth: Payer: Self-pay | Admitting: *Deleted

## 2024-07-04 NOTE — Telephone Encounter (Signed)
 Pt spouse called back and she is aware of appt details

## 2024-07-04 NOTE — Telephone Encounter (Signed)
 LMOVM to call back to give US  PARA appt  07/08/24, arrival 1:30pm at [facility], entrance A

## 2024-07-08 ENCOUNTER — Ambulatory Visit (HOSPITAL_COMMUNITY)
Admission: RE | Admit: 2024-07-08 | Discharge: 2024-07-08 | Disposition: A | Discharge status: Home / Self Care | Source: Ambulatory Visit | Attending: Gastroenterology | Admitting: Gastroenterology

## 2024-07-08 DIAGNOSIS — R188 Other ascites: Secondary | ICD-10-CM | POA: Insufficient documentation

## 2024-07-08 DIAGNOSIS — N186 End stage renal disease: Secondary | ICD-10-CM | POA: Insufficient documentation

## 2024-07-08 DIAGNOSIS — K7460 Unspecified cirrhosis of liver: Secondary | ICD-10-CM | POA: Insufficient documentation

## 2024-07-08 HISTORY — PX: IR PARACENTESIS: IMG2679

## 2024-07-08 LAB — BODY FLUID CELL COUNT WITH DIFFERENTIAL
Eos, Fluid: 0 %
Lymphs, Fluid: 8 %
Monocyte-Macrophage-Serous Fluid: 21 % — ABNORMAL LOW (ref 50–90)
Neutrophil Count, Fluid: 71 % — ABNORMAL HIGH (ref 0–25)
Total Nucleated Cell Count, Fluid: 1565 uL — ABNORMAL HIGH (ref 0–1000)

## 2024-07-08 LAB — GRAM STAIN

## 2024-07-08 MED ORDER — LIDOCAINE-EPINEPHRINE 1 %-1:100000 IJ SOLN
10.0000 mL | Freq: Once | INTRAMUSCULAR | Status: AC
  Administered 2024-07-08: 10 mL via INTRADERMAL

## 2024-07-08 MED ORDER — LIDOCAINE-EPINEPHRINE 1 %-1:100000 IJ SOLN
INTRAMUSCULAR | Status: AC
  Filled 2024-07-08: qty 20

## 2024-07-08 NOTE — Procedures (Signed)
 PROCEDURE SUMMARY:  Successful image-guided paracentesis from the right lower abdomen.  Yielded 4 liters of hazy yellow fluid.  No immediate complications.  EBL = trace. Patient tolerated well.   Specimen was  sent for labs.  Please see imaging section of Epic for full dictation.  The patient has required >/=2 paracenteses in a 30 day period and a screening evaluation by the [facility] has been arranged.   Ahlam Piscitelli H Tashea Othman PA-C 07/08/2024 3:30 PM

## 2024-07-09 ENCOUNTER — Encounter (HOSPITAL_COMMUNITY): Payer: Self-pay

## 2024-07-09 ENCOUNTER — Ambulatory Visit: Payer: Self-pay | Admitting: Gastroenterology

## 2024-07-09 ENCOUNTER — Emergency Department (HOSPITAL_COMMUNITY)

## 2024-07-09 ENCOUNTER — Inpatient Hospital Stay (HOSPITAL_COMMUNITY)
Admission: EM | Admit: 2024-07-09 | Discharge: 2024-07-11 | DRG: 371 | Disposition: A | Discharge status: Home / Self Care | Attending: Family Medicine | Admitting: Family Medicine

## 2024-07-09 ENCOUNTER — Other Ambulatory Visit: Payer: Self-pay

## 2024-07-09 DIAGNOSIS — Z6837 Body mass index (BMI) 37.0-37.9, adult: Secondary | ICD-10-CM

## 2024-07-09 DIAGNOSIS — Z992 Dependence on renal dialysis: Secondary | ICD-10-CM

## 2024-07-09 DIAGNOSIS — N186 End stage renal disease: Secondary | ICD-10-CM | POA: Diagnosis present

## 2024-07-09 DIAGNOSIS — N400 Enlarged prostate without lower urinary tract symptoms: Secondary | ICD-10-CM | POA: Diagnosis present

## 2024-07-09 DIAGNOSIS — Z833 Family history of diabetes mellitus: Secondary | ICD-10-CM

## 2024-07-09 DIAGNOSIS — M549 Dorsalgia, unspecified: Secondary | ICD-10-CM | POA: Diagnosis present

## 2024-07-09 DIAGNOSIS — I2510 Atherosclerotic heart disease of native coronary artery without angina pectoris: Secondary | ICD-10-CM | POA: Diagnosis present

## 2024-07-09 DIAGNOSIS — Z8261 Family history of arthritis: Secondary | ICD-10-CM

## 2024-07-09 DIAGNOSIS — I441 Atrioventricular block, second degree: Secondary | ICD-10-CM | POA: Diagnosis present

## 2024-07-09 DIAGNOSIS — Z951 Presence of aortocoronary bypass graft: Secondary | ICD-10-CM

## 2024-07-09 DIAGNOSIS — E785 Hyperlipidemia, unspecified: Secondary | ICD-10-CM | POA: Diagnosis present

## 2024-07-09 DIAGNOSIS — I152 Hypertension secondary to endocrine disorders: Secondary | ICD-10-CM | POA: Diagnosis present

## 2024-07-09 DIAGNOSIS — Z955 Presence of coronary angioplasty implant and graft: Secondary | ICD-10-CM

## 2024-07-09 DIAGNOSIS — Z860102 Personal history of hyperplastic colon polyps: Secondary | ICD-10-CM

## 2024-07-09 DIAGNOSIS — I10 Essential (primary) hypertension: Secondary | ICD-10-CM | POA: Diagnosis present

## 2024-07-09 DIAGNOSIS — Z803 Family history of malignant neoplasm of breast: Secondary | ICD-10-CM

## 2024-07-09 DIAGNOSIS — I252 Old myocardial infarction: Secondary | ICD-10-CM

## 2024-07-09 DIAGNOSIS — K7469 Other cirrhosis of liver: Secondary | ICD-10-CM

## 2024-07-09 DIAGNOSIS — E1122 Type 2 diabetes mellitus with diabetic chronic kidney disease: Secondary | ICD-10-CM | POA: Diagnosis present

## 2024-07-09 DIAGNOSIS — J4489 Other specified chronic obstructive pulmonary disease: Secondary | ICD-10-CM | POA: Diagnosis present

## 2024-07-09 DIAGNOSIS — E1159 Type 2 diabetes mellitus with other circulatory complications: Secondary | ICD-10-CM | POA: Diagnosis present

## 2024-07-09 DIAGNOSIS — Z79899 Other long term (current) drug therapy: Secondary | ICD-10-CM

## 2024-07-09 DIAGNOSIS — E66812 Obesity, class 2: Secondary | ICD-10-CM | POA: Diagnosis present

## 2024-07-09 DIAGNOSIS — Z8249 Family history of ischemic heart disease and other diseases of the circulatory system: Secondary | ICD-10-CM

## 2024-07-09 DIAGNOSIS — Z825 Family history of asthma and other chronic lower respiratory diseases: Secondary | ICD-10-CM

## 2024-07-09 DIAGNOSIS — I350 Nonrheumatic aortic (valve) stenosis: Secondary | ICD-10-CM | POA: Diagnosis not present

## 2024-07-09 DIAGNOSIS — Z87891 Personal history of nicotine dependence: Secondary | ICD-10-CM

## 2024-07-09 DIAGNOSIS — Z885 Allergy status to narcotic agent status: Secondary | ICD-10-CM

## 2024-07-09 DIAGNOSIS — Z7951 Long term (current) use of inhaled steroids: Secondary | ICD-10-CM

## 2024-07-09 DIAGNOSIS — R188 Other ascites: Secondary | ICD-10-CM | POA: Diagnosis not present

## 2024-07-09 DIAGNOSIS — I959 Hypotension, unspecified: Secondary | ICD-10-CM | POA: Diagnosis present

## 2024-07-09 DIAGNOSIS — M109 Gout, unspecified: Secondary | ICD-10-CM | POA: Diagnosis present

## 2024-07-09 DIAGNOSIS — G4733 Obstructive sleep apnea (adult) (pediatric): Secondary | ICD-10-CM | POA: Diagnosis present

## 2024-07-09 DIAGNOSIS — K7460 Unspecified cirrhosis of liver: Secondary | ICD-10-CM | POA: Diagnosis present

## 2024-07-09 DIAGNOSIS — K219 Gastro-esophageal reflux disease without esophagitis: Secondary | ICD-10-CM | POA: Diagnosis present

## 2024-07-09 DIAGNOSIS — E039 Hypothyroidism, unspecified: Secondary | ICD-10-CM | POA: Diagnosis present

## 2024-07-09 DIAGNOSIS — G8929 Other chronic pain: Secondary | ICD-10-CM | POA: Diagnosis present

## 2024-07-09 DIAGNOSIS — Z7982 Long term (current) use of aspirin: Secondary | ICD-10-CM

## 2024-07-09 DIAGNOSIS — K652 Spontaneous bacterial peritonitis: Principal | ICD-10-CM | POA: Diagnosis present

## 2024-07-09 DIAGNOSIS — Z96651 Presence of right artificial knee joint: Secondary | ICD-10-CM | POA: Diagnosis present

## 2024-07-09 DIAGNOSIS — D631 Anemia in chronic kidney disease: Secondary | ICD-10-CM | POA: Diagnosis present

## 2024-07-09 LAB — CBC WITH DIFFERENTIAL/PLATELET
Abs Immature Granulocytes: 0.04 K/uL (ref 0.00–0.07)
Basophils Absolute: 0 K/uL (ref 0.0–0.1)
Basophils Relative: 1 %
Eosinophils Absolute: 0.1 K/uL (ref 0.0–0.5)
Eosinophils Relative: 2 %
HCT: 34.8 % — ABNORMAL LOW (ref 39.0–52.0)
Hemoglobin: 10.7 g/dL — ABNORMAL LOW (ref 13.0–17.0)
Immature Granulocytes: 1 %
Lymphocytes Relative: 13 %
Lymphs Abs: 0.6 K/uL — ABNORMAL LOW (ref 0.7–4.0)
MCH: 27.2 pg (ref 26.0–34.0)
MCHC: 30.7 g/dL (ref 30.0–36.0)
MCV: 88.3 fL (ref 80.0–100.0)
Monocytes Absolute: 0.5 K/uL (ref 0.1–1.0)
Monocytes Relative: 11 %
Neutro Abs: 3.3 K/uL (ref 1.7–7.7)
Neutrophils Relative %: 72 %
Platelets: 188 K/uL (ref 150–400)
RBC: 3.94 MIL/uL — ABNORMAL LOW (ref 4.22–5.81)
RDW: 17.2 % — ABNORMAL HIGH (ref 11.5–15.5)
WBC: 4.6 K/uL (ref 4.0–10.5)
nRBC: 0 % (ref 0.0–0.2)

## 2024-07-09 LAB — PROTIME-INR
INR: 1.1 (ref 0.8–1.2)
Prothrombin Time: 15 s (ref 11.4–15.2)

## 2024-07-09 LAB — COMPREHENSIVE METABOLIC PANEL WITH GFR
ALT: 27 U/L (ref 0–44)
AST: 25 U/L (ref 15–41)
Albumin: 3.2 g/dL — ABNORMAL LOW (ref 3.5–5.0)
Alkaline Phosphatase: 96 U/L (ref 38–126)
Anion gap: 15 (ref 5–15)
BUN: 26 mg/dL — ABNORMAL HIGH (ref 8–23)
CO2: 25 mmol/L (ref 22–32)
Calcium: 9.1 mg/dL (ref 8.9–10.3)
Chloride: 98 mmol/L (ref 98–111)
Creatinine, Ser: 7.53 mg/dL — ABNORMAL HIGH (ref 0.61–1.24)
GFR, Estimated: 7 mL/min — ABNORMAL LOW
Glucose, Bld: 112 mg/dL — ABNORMAL HIGH (ref 70–99)
Potassium: 4.2 mmol/L (ref 3.5–5.1)
Sodium: 138 mmol/L (ref 135–145)
Total Bilirubin: 0.4 mg/dL (ref 0.0–1.2)
Total Protein: 6.6 g/dL (ref 6.5–8.1)

## 2024-07-09 LAB — GLUCOSE, CAPILLARY
Glucose-Capillary: 81 mg/dL (ref 70–99)
Glucose-Capillary: 111 mg/dL — ABNORMAL HIGH (ref 70–99)

## 2024-07-09 LAB — LACTIC ACID, PLASMA: Lactic Acid, Venous: 1.6 mmol/L (ref 0.5–1.9)

## 2024-07-09 LAB — PATHOLOGIST SMEAR REVIEW

## 2024-07-09 MED ORDER — CHLORHEXIDINE GLUCONATE CLOTH 2 % EX PADS
6.0000 | MEDICATED_PAD | Freq: Every day | CUTANEOUS | Status: DC
  Administered 2024-07-09 – 2024-07-11 (×3): 6 via TOPICAL

## 2024-07-09 MED ORDER — SODIUM CHLORIDE 0.9% FLUSH
10.0000 mL | Freq: Two times a day (BID) | INTRAVENOUS | Status: DC
  Administered 2024-07-09 – 2024-07-11 (×4): 10 mL

## 2024-07-09 MED ORDER — TAMSULOSIN HCL 0.4 MG PO CAPS
0.4000 mg | ORAL_CAPSULE | Freq: Every day | ORAL | Status: DC
  Administered 2024-07-09: 0.4 mg via ORAL
  Filled 2024-07-09 (×3): qty 1

## 2024-07-09 MED ORDER — FERRIC CITRATE 1 GM 210 MG(FE) PO TABS
420.0000 mg | ORAL_TABLET | Freq: Three times a day (TID) | ORAL | Status: DC
  Administered 2024-07-09 – 2024-07-11 (×4): 420 mg via ORAL
  Filled 2024-07-09 (×9): qty 2

## 2024-07-09 MED ORDER — ONDANSETRON HCL 4 MG PO TABS: 4.0000 mg | ORAL_TABLET | Freq: Four times a day (QID) | ORAL | Status: DC | PRN

## 2024-07-09 MED ORDER — AMLODIPINE BESYLATE 5 MG PO TABS
5.0000 mg | ORAL_TABLET | Freq: Every day | ORAL | Status: DC
  Administered 2024-07-10: 5 mg via ORAL
  Filled 2024-07-09: qty 1

## 2024-07-09 MED ORDER — SODIUM CHLORIDE 0.9 % IV SOLN
2.0000 g | INTRAVENOUS | Status: DC
  Administered 2024-07-10 – 2024-07-11 (×2): 2 g via INTRAVENOUS
  Filled 2024-07-09 (×2): qty 20

## 2024-07-09 MED ORDER — CARVEDILOL 3.125 MG PO TABS
6.2500 mg | ORAL_TABLET | Freq: Two times a day (BID) | ORAL | Status: DC
  Administered 2024-07-09 – 2024-07-10 (×2): 6.25 mg via ORAL
  Filled 2024-07-09 (×2): qty 2

## 2024-07-09 MED ORDER — ALBUMIN HUMAN 25 % IV SOLN
50.0000 g | Freq: Once | INTRAVENOUS | Status: AC
  Administered 2024-07-09: 12.5 g via INTRAVENOUS
  Filled 2024-07-09: qty 200

## 2024-07-09 MED ORDER — DIVALPROEX SODIUM 250 MG PO DR TAB
250.0000 mg | DELAYED_RELEASE_TABLET | Freq: Two times a day (BID) | ORAL | Status: DC
  Administered 2024-07-10: 250 mg via ORAL
  Filled 2024-07-09 (×2): qty 1

## 2024-07-09 MED ORDER — INSULIN ASPART 100 UNIT/ML IJ SOLN: 0.0000 [IU] | Freq: Every day | INTRAMUSCULAR | Status: DC

## 2024-07-09 MED ORDER — MIDODRINE HCL 5 MG PO TABS
10.0000 mg | ORAL_TABLET | ORAL | Status: DC
  Administered 2024-07-11: 10 mg via ORAL
  Filled 2024-07-09: qty 2

## 2024-07-09 MED ORDER — SODIUM CHLORIDE 0.9% FLUSH: 10.0000 mL | INTRAVENOUS | Status: DC | PRN

## 2024-07-09 MED ORDER — ATORVASTATIN CALCIUM 40 MG PO TABS
80.0000 mg | ORAL_TABLET | Freq: Every day | ORAL | Status: DC
  Administered 2024-07-10 – 2024-07-11 (×2): 80 mg via ORAL
  Filled 2024-07-09 (×2): qty 2

## 2024-07-09 MED ORDER — ASPIRIN 81 MG PO TBEC
81.0000 mg | DELAYED_RELEASE_TABLET | Freq: Every day | ORAL | Status: DC
  Administered 2024-07-10 – 2024-07-11 (×2): 81 mg via ORAL
  Filled 2024-07-09 (×2): qty 1

## 2024-07-09 MED ORDER — ONDANSETRON HCL 4 MG/2ML IJ SOLN: 4.0000 mg | Freq: Four times a day (QID) | INTRAMUSCULAR | Status: DC | PRN

## 2024-07-09 MED ORDER — ALBUMIN HUMAN 25 % IV SOLN
50.0000 g | Freq: Once | INTRAVENOUS | Status: AC
  Administered 2024-07-09: 12.5 g via INTRAVENOUS

## 2024-07-09 MED ORDER — HEPARIN SODIUM (PORCINE) 5000 UNIT/ML IJ SOLN
5000.0000 [IU] | Freq: Three times a day (TID) | INTRAMUSCULAR | Status: DC
  Administered 2024-07-09 – 2024-07-10 (×4): 5000 [IU] via SUBCUTANEOUS
  Filled 2024-07-09 (×4): qty 1

## 2024-07-09 MED ORDER — ALBUTEROL SULFATE (2.5 MG/3ML) 0.083% IN NEBU: 3.0000 mL | INHALATION_SOLUTION | Freq: Four times a day (QID) | RESPIRATORY_TRACT | Status: DC | PRN

## 2024-07-09 MED ORDER — CALCITRIOL 0.25 MCG PO CAPS
0.5000 ug | ORAL_CAPSULE | Freq: Every day | ORAL | Status: DC
  Administered 2024-07-10 – 2024-07-11 (×2): 0.5 ug via ORAL
  Filled 2024-07-09 (×2): qty 2

## 2024-07-09 MED ORDER — LORATADINE 10 MG PO TABS
10.0000 mg | ORAL_TABLET | Freq: Every day | ORAL | Status: DC
  Administered 2024-07-10 – 2024-07-11 (×2): 10 mg via ORAL
  Filled 2024-07-09 (×2): qty 1

## 2024-07-09 MED ORDER — SODIUM CHLORIDE 0.9 % IV SOLN
2.0000 g | Freq: Once | INTRAVENOUS | Status: AC
  Administered 2024-07-09: 2 g via INTRAVENOUS
  Filled 2024-07-09: qty 20

## 2024-07-09 MED ORDER — INSULIN ASPART 100 UNIT/ML IJ SOLN: 0.0000 [IU] | Freq: Three times a day (TID) | INTRAMUSCULAR | Status: DC

## 2024-07-09 MED ORDER — BUDESON-GLYCOPYRROL-FORMOTEROL 160-9-4.8 MCG/ACT IN AERO
2.0000 | INHALATION_SPRAY | Freq: Two times a day (BID) | RESPIRATORY_TRACT | Status: DC
  Administered 2024-07-09 – 2024-07-11 (×4): 2 via RESPIRATORY_TRACT
  Filled 2024-07-09: qty 5.9

## 2024-07-09 MED ORDER — ACETAMINOPHEN 325 MG PO TABS
650.0000 mg | ORAL_TABLET | Freq: Four times a day (QID) | ORAL | Status: DC | PRN
  Administered 2024-07-09 – 2024-07-11 (×3): 650 mg via ORAL
  Filled 2024-07-09 (×2): qty 2

## 2024-07-09 MED ORDER — VITAMIN C 500 MG PO TABS
500.0000 mg | ORAL_TABLET | Freq: Every day | ORAL | Status: DC
  Administered 2024-07-10 – 2024-07-11 (×2): 500 mg via ORAL
  Filled 2024-07-09 (×2): qty 1

## 2024-07-09 NOTE — ED Notes (Signed)
 ED Provider at bedside.

## 2024-07-09 NOTE — Plan of Care (Signed)
  Problem: Education: Goal: Knowledge of General Education information will improve Description: Including pain rating scale, medication(s)/side effects and non-pharmacologic comfort measures Outcome: Progressing   Problem: Health Behavior/Discharge Planning: Goal: Ability to manage health-related needs will improve Outcome: Progressing   Problem: Clinical Measurements: Goal: Ability to maintain clinical measurements within normal limits will improve Outcome: Progressing Goal: Will remain free from infection Outcome: Progressing Goal: Diagnostic test results will improve Outcome: Progressing Goal: Respiratory complications will improve Outcome: Progressing Goal: Cardiovascular complication will be avoided Outcome: Progressing   Problem: Nutrition: Goal: Adequate nutrition will be maintained Outcome: Progressing   Problem: Coping: Goal: Level of anxiety will decrease Outcome: Progressing   Problem: Elimination: Goal: Will not experience complications related to bowel motility Outcome: Progressing Goal: Will not experience complications related to urinary retention Outcome: Progressing   Problem: Pain Managment: Goal: General experience of comfort will improve and/or be controlled Outcome: Progressing   Problem: Safety: Goal: Ability to remain free from injury will improve Outcome: Progressing   Problem: Skin Integrity: Goal: Risk for impaired skin integrity will decrease Outcome: Progressing   Problem: Education: Goal: Ability to describe self-care measures that may prevent or decrease complications (Diabetes Survival Skills Education) will improve Outcome: Progressing Goal: Individualized Educational Video(s) Outcome: Progressing   Problem: Coping: Goal: Ability to adjust to condition or change in health will improve Outcome: Progressing   Problem: Fluid Volume: Goal: Ability to maintain a balanced intake and output will improve Outcome: Progressing    Problem: Health Behavior/Discharge Planning: Goal: Ability to identify and utilize available resources and services will improve Outcome: Progressing Goal: Ability to manage health-related needs will improve Outcome: Progressing   Problem: Metabolic: Goal: Ability to maintain appropriate glucose levels will improve Outcome: Progressing   Problem: Nutritional: Goal: Maintenance of adequate nutrition will improve Outcome: Progressing Goal: Progress toward achieving an optimal weight will improve Outcome: Progressing   Problem: Skin Integrity: Goal: Risk for impaired skin integrity will decrease Outcome: Progressing   Problem: Tissue Perfusion: Goal: Adequacy of tissue perfusion will improve Outcome: Progressing

## 2024-07-09 NOTE — Consult Note (Addendum)
 [facility]ox[facility] Burkitt Faizan Tye Juarez, M.D. Gastroenterology & Hepatology                                           Patient Name: [name] [name]  MRN: [id number] Admission Date: 07/09/2024 Date of Evaluation:  07/09/2024 Time of Evaluation: 3:15 PM  HPI:  This is a 73 y.o. male with history of history of GERD, multifactorial anemia with chronic disease/IDA, previously on peritoneal dialysis complicated by previous intra-abdominal infection from peritoneal dialysis catheter and now currently on HD, cirrhosis, gallbladder polyp, severe aortic stenosis, CAD s/p CABG, diabetes, COPD, HTN, HLD, OSA, and CHF, SBP diagnosed (06/26/2024) presented again with persistent neutrophils on ascitic fluid not improving with outpatient IV antibiotic.  Patient is seen in the ER today.  Reports left lower quadrant abdominal discomfort which has been intermittent for past 2 weeks.  Patient had paracentesis done yesterday with 4 L removed .Patient has received IV antibiotics with dialysis cefepime  Monday Wednesday Friday for paracentesis done as outpatient demonstrating SBP.  Patient denies any confusion and overt bleeding such as hematochezia or melena  [id number] (06/26/2024) --->PMN 1565 with 71% No organisms have grown Blood has been drawn but not resulted yet  Past Medical History: SEE CHRONIC ISSSUES: Past Medical History:  Diagnosis Date   Anemia    Arthritis    Asthma    Bell palsy    left side of face- comes and goes per pt on 11/12/23   CAD (coronary artery disease)    a. 2014 MV: abnl w/ infap ischemia; b. 03/2013 Cath: aneurysmal bleb in the LAD w/ otw nonobs dzs-->Med Rx.   Chronic back pain    Chronic kidney disease    on dialysis - mon wed fri   Chronic knee pain    a. 09/2015 s/p R TKA.   Chronic pain    Chronic shoulder pain    Chronic sinusitis    COPD (chronic obstructive pulmonary disease) (HCC)    Diabetes mellitus without complication (HCC)    type II , no meds, does not check blood sugar   Dyspnea     with exertion   Dysrhythmia    2nd degree AV block   ESRD on peritoneal dialysis (HCC)    on peritoneal dialysis, [facility] - mon wed fri   Essential hypertension    GERD (gastroesophageal reflux disease)    Gout    Hepatomegaly    noted on noncontrast CT 2015   History of hiatal hernia    Hyperlipidemia    Hypothyroidism    Lateral meniscus tear    Myocardial infarction (HCC) 07/2021   Neuromuscular disorder (HCC)    Obesity    Truncal   Obstructive sleep apnea    does not use cpap    On home oxygen  therapy    uses 2L when needed   Pneumonia    PUD (peptic ulcer disease)    remote, reports f/u EGD about 8 years ago unremarkable    Reactive airway disease    related to exposure to chemical during 9/11   Sinusitis    Substance abuse (HCC)    hx in past   Vitamin D  deficiency    Past Surgical History:  Past Surgical History:  Procedure Laterality Date   A/V FISTULAGRAM N/A 02/28/2023   Procedure: A/V Fistulagram;  Surgeon: Lanis Fonda BRAVO, MD;  Location: [facility] INVASIVE CV LAB;  Service: Cardiovascular;  Laterality: N/A;   A/V FISTULAGRAM N/A 10/22/2023   Procedure: A/V Fistulagram;  Surgeon: Sheree Penne Bruckner, MD;  Location: [facility] INVASIVE CV LAB;  Service: Cardiovascular;  Laterality: N/A;   ASAD LT SHOULDER  12/15/2008   left shoulder   AV FISTULA PLACEMENT Left 08/09/2016   Procedure: BRACHIOCEPHALIC ARTERIOVENOUS (AV) FISTULA CREATION LEFT ARM;  Surgeon: Carlin FORBES Haddock, MD;  Location: [facility] OR;  Service: Vascular;  Laterality: Left;   BASCILIC VEIN TRANSPOSITION Left 11/13/2023   Procedure: TRANSPOSITION, VEIN, BASILIC;  Surgeon: Sheree Penne Bruckner, MD;  Location: [facility] OR;  Service: Vascular;  Laterality: Left;   CAPD INSERTION N/A 10/07/2018   Procedure: LAPAROSCOPIC PERITONEAL CATHETER PLACEMENT;  Surgeon: Stevie Herlene Righter, MD;  Location: [facility] ORS;  Service: General;  Laterality: N/A;   CATARACT EXTRACTION W/PHACO Left 03/28/2016   Procedure:  CATARACT EXTRACTION PHACO AND INTRAOCULAR LENS PLACEMENT LEFT EYE;  Surgeon: Oneil Platts, MD;  Location: [facility] ORS;  Service: Ophthalmology;  Laterality: Left;  CDE: 4.77   CATARACT EXTRACTION W/PHACO Right 04/11/2016   Procedure: CATARACT EXTRACTION PHACO AND INTRAOCULAR LENS PLACEMENT RIGHT EYE; CDE:  4.74;  Surgeon: Oneil Platts, MD;  Location: [facility] ORS;  Service: Ophthalmology;  Laterality: Right;   COLONOSCOPY  10/15/2008   Fields: Rectal polyp obliterated, not retrieved, hemorrhoids, single ascending colon diverticulum near the CV. Next colonoscopy April 2020   COLONOSCOPY N/A 12/25/2014   SLF: 1. Colorectal polyps (2) removed 2. Small internal hemorrhoids 3. the left colon is severely redundant. hyperplastic polyps   CORONARY ARTERY BYPASS GRAFT N/A 06/05/2022   Procedure: OFF PUMP CORONARY ARTERY BYPASS GRAFTING (CABG) X 2 BYPASSES USING LEFT INTERNAL MAMMARY ARTERY AND RIGHT LEG GREATER SAPHENOUS VEIN HARVESTED ENDOSCOPICALLY;  Surgeon: Shyrl Linnie KIDD, MD;  Location: [facility] OR;  Service: Open Heart Surgery;  Laterality: N/A;   CORONARY STENT INTERVENTION N/A 07/25/2021   Procedure: CORONARY STENT INTERVENTION;  Surgeon: Wonda Sharper, MD;  Location: [facility] INVASIVE CV LAB;  Service: Cardiovascular;  Laterality: N/A;   CORONARY STENT INTERVENTION N/A 12/26/2021   Procedure: CORONARY STENT INTERVENTION;  Surgeon: Mady Bruckner, MD;  Location: [facility] INVASIVE CV LAB;  Service: Cardiovascular;  Laterality: N/A;   CORONARY STENT INTERVENTION N/A 01/20/2022   Procedure: CORONARY STENT INTERVENTION;  Surgeon: Wonda Sharper, MD;  Location: [facility] INVASIVE CV LAB;  Service: Cardiovascular;  Laterality: N/A;   DOPPLER ECHOCARDIOGRAPHY     ESOPHAGOGASTRODUODENOSCOPY N/A 12/25/2014   SLF: 1. Anemia most likely due to CRI, gastritis, gastric polyps 2. Moderate non-erosive gastriits and mild duodenitis.  3.TWo large gstric polyps removed.    EYE SURGERY  12/22/2010   tear duct probing-[place]   FOREIGN BODY  REMOVAL  03/29/2011   Procedure: REMOVAL FOREIGN BODY EXTREMITY;  Surgeon: Taft Minerva, MD;  Location: [facility] ORS;  Service: Orthopedics;  Laterality: Right;  Removal Foreign Body Right Thumb   INSERTION OF DIALYSIS CATHETER N/A 11/13/2023   Procedure: INSERTION OF DIALYSIS CATHETER;  Surgeon: Sheree Penne Bruckner, MD;  Location: [facility] OR;  Service: Vascular;  Laterality: N/A;   IR FLUORO GUIDE CV LINE RIGHT  08/06/2018   IR FLUORO GUIDE CV LINE RIGHT  02/17/2023   IR PARACENTESIS  06/26/2024   IR PARACENTESIS  07/08/2024   IR US  GUIDE VASC ACCESS RIGHT  08/06/2018   IR US  GUIDE VASC ACCESS RIGHT  02/17/2023   KNEE ARTHROSCOPY  10/16/2007   left   KNEE ARTHROSCOPY WITH LATERAL MENISECTOMY Right 10/14/2015   Procedure: LEFT KNEE ARTHROSCOPY WITH PARTIAL LATERAL MENISECTOMY;  Surgeon: Taft FORBES Minerva, MD;  Location: [facility]S;  Service: Orthopedics;  Laterality: Right;   LEFT HEART CATH AND CORONARY ANGIOGRAPHY N/A 07/25/2021   Procedure: LEFT HEART CATH AND CORONARY ANGIOGRAPHY;  Surgeon: Wonda Sharper, MD;  Location: [facility] INVASIVE CV LAB;  Service: Cardiovascular;  Laterality: N/A;   LEFT HEART CATH AND CORONARY ANGIOGRAPHY N/A 12/26/2021   Procedure: LEFT HEART CATH AND CORONARY ANGIOGRAPHY;  Surgeon: Mady Bruckner, MD;  Location: [facility] INVASIVE CV LAB;  Service: Cardiovascular;  Laterality: N/A;   LEFT HEART CATH AND CORONARY ANGIOGRAPHY N/A 01/20/2022   Procedure: LEFT HEART CATH AND CORONARY ANGIOGRAPHY;  Surgeon: Wonda Sharper, MD;  Location: [facility] INVASIVE CV LAB;  Service: Cardiovascular;  Laterality: N/A;   LEFT HEART CATHETERIZATION WITH CORONARY ANGIOGRAM N/A 03/28/2013   Procedure: LEFT HEART CATHETERIZATION WITH CORONARY ANGIOGRAM;  Surgeon: Alm LELON Clay, MD;  Location: [facility] CATH LAB;  Service: Cardiovascular;  Laterality: N/A;   NM MYOVIEW  LTD     PENILE PROSTHESIS IMPLANT N/A 08/16/2015   Procedure: PENILE PROTHESIS INFLATABLE, three piece, Excisional biopsy of Penile ulcer,  Penile molding;  Surgeon: Arlena Gal, MD;  Location: [facility] ORS;  Service: Urology;  Laterality: N/A;   PENILE PROSTHESIS IMPLANT N/A 12/24/2017   Procedure: REMOVAL AND  REPLACEMENT  COLOPLAST PENILE PROSTHESIS;  Surgeon: Carolee Sherwood JONETTA DOUGLAS, MD;  Location: [facility] ORS;  Service: Urology;  Laterality: N/A;   PERIPHERAL VASCULAR BALLOON ANGIOPLASTY  02/28/2023   Procedure: PERIPHERAL VASCULAR BALLOON ANGIOPLASTY;  Surgeon: Lanis Fonda FORBES, MD;  Location: [facility] INVASIVE CV LAB;  Service: Cardiovascular;;   PORT-A-CATH REMOVAL N/A 02/19/2023   Procedure: REMOVAL OF PERITONEAL DIALYSIS CATHETER;  Surgeon: Lanis Fonda FORBES, MD;  Location: [facility] OR;  Service: Vascular;  Laterality: N/A;   QUADRICEPS TENDON REPAIR  07/21/2011   Procedure: REPAIR QUADRICEP TENDON;  Surgeon: Taft Minerva, MD;  Location: [facility]S;  Service: Orthopedics;  Laterality: Right;   REVISON OF ARTERIOVENOUS FISTULA Left 11/13/2023   Procedure: REVISON OF ARTERIOVENOUS FISTULA;  Surgeon: Sheree Penne Bruckner, MD;  Location: [facility] OR;  Service: Vascular;  Laterality: Left;   RIGHT/LEFT HEART CATH AND CORONARY ANGIOGRAPHY N/A 05/30/2022   Procedure: RIGHT/LEFT HEART CATH AND CORONARY ANGIOGRAPHY;  Surgeon: Dann Candyce RAMAN, MD;  Location: [facility] INVASIVE CV LAB;  Service: Cardiovascular;  Laterality: N/A;   TEE WITHOUT CARDIOVERSION N/A 06/05/2022   Procedure: TRANSESOPHAGEAL ECHOCARDIOGRAM (TEE);  Surgeon: Shyrl Linnie KIDD, MD;  Location: [facility] OR;  Service: Open Heart Surgery;  Laterality: N/A;   TOENAIL EXCISION     removed x2-bilateral   UMBILICAL HERNIA REPAIR  07/17/2005   [place]   Family History:  Family History  Problem Relation Age of Onset   Hypertension Mother        MI   Cancer Mother        breast    Diabetes Mother    Diabetes Father    Hypertension Father    Hypertension Sister    Diabetes Sister    Arthritis Other    Asthma Other    Lung disease Other    Anesthesia problems Neg Hx    Hypotension Neg Hx     Malignant hyperthermia Neg Hx    Pseudochol deficiency Neg Hx    Colon cancer Neg Hx    Social History: Social History[1]  Home Medications:  Prior to Admission medications  Medication Sig Start Date End Date Taking? Authorizing Provider  acetaminophen  (TYLENOL ) 650 MG CR tablet Take 1,300 mg by mouth every 8 (eight) hours  as needed for pain.   Yes [provider]  albuterol  (VENTOLIN  HFA) 108 (90 Base) MCG/ACT inhaler Inhale 2 puffs into the lungs every 6 (six) hours as needed for wheezing or shortness of breath. 08/26/23  Yes Emokpae, Courage, MD  amLODipine  (NORVASC ) 5 MG tablet Take 1 tablet (5 mg total) by mouth daily. 08/26/23  Yes Emokpae, Courage, MD  ascorbic acid  (VITAMIN C ) 500 MG tablet Take 1 tablet (500 mg total) by mouth daily. 05/07/23  Yes Setzer, Nena PARAS, PA-C  aspirin  EC 81 MG tablet Take 81 mg by mouth daily. Swallow whole.   Yes [provider]  atorvastatin  (LIPITOR ) 80 MG tablet Take 1 tablet (80 mg total) by mouth daily. 06/20/22  Yes Dwan Aldo M, PA-C  benzonatate  (TESSALON ) 100 MG capsule Take 100 mg by mouth daily. 04/28/24  Yes [provider]  calcitRIOL  (ROCALTROL ) 0.5 MCG capsule Take 0.5 mcg by mouth daily.   Yes [provider]  carvedilol  (COREG ) 6.25 MG tablet Take 6.25 mg by mouth 2 (two) times daily. 03/27/24  Yes [provider]  divalproex  (DEPAKOTE ) 250 MG DR tablet Take 250 mg by mouth 2 (two) times daily. 03/27/24  Yes [provider]  ferric citrate  (AURYXIA ) 1 GM 210 MG(Fe) tablet Take 420 mg by mouth 3 (three) times daily with meals.   Yes [provider]  loratadine  (CLARITIN ) 10 MG tablet Take 10 mg by mouth daily.   Yes [provider]  midodrine  (PROAMATINE ) 10 MG tablet Take 10 mg by mouth 3 (three) times a week. Pablo [place], and Friday 12/26/23  Yes [provider]  Multiple Vitamins-Minerals (MENS MULTIVITAMIN PO) Take 1 tablet by mouth daily.   Yes [provider]  OVER THE COUNTER MEDICATION Take 2 tablets by mouth daily at 6 (six) AM.   Yes [provider]  tamsulosin  (FLOMAX ) 0.4 MG CAPS capsule Take 0.4 mg by mouth daily after supper. 09/17/23  Yes [provider]  TRELEGY ELLIPTA  200-62.5-25 MCG/ACT AEPB INHALE ONE PUFF INTO THE LUNGS ONCE DAILY 07/02/24  Yes Hope Almarie ORN, NP    Inpatient Medications: Current Medications[2] Allergies: Tramadol  and Opana [oxymorphone hcl]  Complete Review of Systems: GENERAL: negative for malaise, night sweats HEENT: No changes in hearing or vision, no nose bleeds or other nasal problems. NECK: Negative for lumps, goiter, pain and significant neck swelling RESPIRATORY: Negative for cough, wheezing CARDIOVASCULAR: Negative for chest pain, leg swelling, palpitations, orthopnea GI: SEE HPI MUSCULOSKELETAL: Negative for joint pain or swelling, back pain, and muscle pain. SKIN: Negative for lesions, rash PSYCH: Negative for sleep disturbance, mood disorder and recent psychosocial stressors. HEMATOLOGY Negative for prolonged bleeding, bruising easily, and swollen nodes. ENDOCRINE: Negative for cold or heat intolerance, polyuria, polydipsia and goiter. NEURO: negative for tremor, gait imbalance, syncope and seizures. The remainder of the review of systems is noncontributory.  Physical Exam: BP (!) 112/58 (BP Location: Right Arm)   Pulse (!) 51   Temp 98.3 F (36.8 C) (Oral)   Resp 18   Ht 5' 4 (1.626 m)   Wt 98.2 kg   SpO2 100%   BMI 37.14 kg/m  GENERAL: The patient is AO x3, in no acute distress. HEENT: Head is normocephalic and atraumatic. EOMI are intact. Mouth is well hydrated and without lesions. NECK: Supple. No masses LUNGS: Clear to auscultation. No presence of rhonchi/wheezing/rales. Adequate chest expansion HEART: RRR, normal s1 and s2. ABDOMEN: Soft, nontender, no guarding, no peritoneal signs, and nondistended. BS +.  No masses.   Laboratory Data CBC:      Component Value Date/Time   WBC 4.6 07/09/2024 1205   RBC 3.94 (L) 07/09/2024 1205   HGB 10.7 (L) 07/09/2024 1205   HGB 11.3 (L) 12/22/2021 1017   HCT 34.8 (L) 07/09/2024 1205   HCT 33.7 (L) 12/22/2021 1017   PLT 188 07/09/2024 1205   PLT 117 (L) 12/22/2021 1017   MCV 88.3 07/09/2024 1205   MCV 91 12/22/2021 1017   MCH 27.2 07/09/2024 1205   MCHC 30.7 07/09/2024 1205   RDW 17.2 (H) 07/09/2024 1205   RDW 13.7 12/22/2021 1017   LYMPHSABS 0.6 (L) 07/09/2024 1205   MONOABS 0.5 07/09/2024 1205   EOSABS 0.1 07/09/2024 1205   BASOSABS 0.0 07/09/2024 1205   COAG:  Lab Results  Component Value Date   INR 1.1 07/09/2024   INR 1.1 05/21/2024   INR 1.1 04/15/2024    BMP:     Latest Ref Rng & Units 07/09/2024   12:05 PM 06/27/2024   11:20 AM 05/22/2024   12:09 AM  BMP  Glucose 70 - 99 mg/dL 887  897  89   BUN 8 - 23 mg/dL 26  19  53   Creatinine 0.61 - 1.24 mg/dL 2.46  3.74  89.89   Sodium 135 - 145 mmol/L 138  140  137   Potassium 3.5 - 5.1 mmol/L 4.2  3.6  4.9   Chloride 98 - 111 mmol/L 98  99  100   CO2 22 - 32 mmol/L 25  26    Calcium  8.9 - 10.3 mg/dL 9.1  9.1      HEPATIC:     Latest Ref Rng & Units 07/09/2024   12:05 PM 06/27/2024   11:20 AM 05/21/2024    8:11 AM  Hepatic Function  Total Protein 6.5 - 8.1 g/dL 6.6  7.5  8.0   Albumin  3.5 - 5.0 g/dL 3.2  3.4  4.2   AST 15 - 41 U/L 25  45  23   ALT 0 - 44 U/L 27  64  16   Alk Phosphatase 38 - 126 U/L 96  102  77   Total Bilirubin 0.0 - 1.2 mg/dL 0.4  0.5  0.6     CARDIAC:  Lab Results  Component Value Date   CKTOTAL 335 02/15/2023   TROPONINI 0.04 (HH) 08/30/2018     Imaging: I personally reviewed and interpreted the available imaging.  Assessment & Plan:  This is a 73 y.o. male with history of history of GERD, multifactorial anemia with chronic disease/IDA, previously on peritoneal dialysis complicated by previous intra-abdominal infection from peritoneal dialysis catheter and now currently on HD,  cirrhosis, gallbladder polyp, severe aortic stenosis, CAD s/p CABG, diabetes, COPD, HTN, HLD, OSA, and CHF, SBP diagnosed (06/26/2024) presented again with persistent neutrophils on ascitic fluid not improving with outpatient IV antibiotic.  #Decompensated cirrhosis with Ascites #culture-negative neutrocytic ascites  Repeat para with increased WBC count, PMN now greater than 1000. No growth thus far on culture. Patient has chronic left sided abdominal discomfort.   Patient has been getting cefepime  2 g with dialysis including had 1 dose of albumin  ; repeat paracentesis with persistent neutrophils  Recs:  -Broad-spectrum antibiotics Consult ID as patient has failed outpatient IV antibiotics treatment with cefepime  . Query if he has a secondary source for peritonitis. No culture growth from prior fluid culture.   -Albumin  (1.5g/kg on day 1 and than 1.0 g/kg on day 3,  max 100gm)   -repeat paracentesis in 48 hours ( on Friday ) ; to send cell count , culture   -upon discharge would recommend indefinite antibiotic secondary prophylaxis   -continued care for decompensated cirrhosis as outpatient   Keiron Iodice Faizan Rohin Krejci, MD Gastroenterology and Hepatology [facility] Gastroenterology  This chart has been completed using [facility] Dictation software, and while attempts have been made to ensure accuracy , certain words and phrases may not be transcribed as intended      [1]  Social History Tobacco Use   Smoking status: Former    Current packs/day: 0.00    Average packs/day: 1 pack/day for 25.0 years (25.0 ttl pk-yrs)    Types: Cigarettes    Start date: 03/27/1985    Quit date: 03/27/2010    Years since quitting: 14.2   Smokeless tobacco: Never   Tobacco comments:    Quit in 2011  Vaping Use   Vaping status: Never Used  Substance Use Topics   Alcohol  use: Yes    Comment: occasionally liquor   Drug use: Yes    Types: Marijuana    Comment: cocaine- last time used-  11/24/2017 , marijuana- last use 11/10/23 withhold 24 hrs prior to procedure  [2]  Current Facility-Administered Medications:    acetaminophen  (TYLENOL ) tablet 650 mg, 650 mg, Oral, Q6H PRN, Maree, Pratik D, DO, 650 mg at 07/09/24 1412   albuterol  (PROVENTIL ) (2.5 MG/3ML) 0.083% nebulizer solution 3 mL, 3 mL, Nebulization, Q6H PRN, Maree, Pratik D, DO   [START ON 07/10/2024] amLODipine  (NORVASC ) tablet 5 mg, 5 mg, Oral, Daily, Shah, Pratik D, DO   [START ON 07/10/2024] ascorbic acid  (VITAMIN C ) tablet 500 mg, 500 mg, Oral, Daily, Shah, Pratik D, DO   [START ON 07/10/2024] aspirin  EC tablet 81 mg, 81 mg, Oral, Daily, Shah, Pratik D, DO   [START ON 07/10/2024] atorvastatin  (LIPITOR ) tablet 80 mg, 80 mg, Oral, Daily, Maree, Pratik D, DO   budesonide -glycopyrrolate -formoterol  (BREZTRI ) 160-9-4.8 MCG/ACT inhaler 2 puff, 2 puff, Inhalation, BID, Maree, Pratik D, DO   [START ON 07/10/2024] calcitRIOL  (ROCALTROL ) capsule 0.5 mcg, 0.5 mcg, Oral, Daily, Maree, Pratik D, DO   carvedilol  (COREG ) tablet 6.25 mg, 6.25 mg, Oral, BID WC, Shah, Pratik D, DO   [START ON 07/10/2024] cefTRIAXone  (ROCEPHIN ) 2 g in sodium chloride  0.9 % 100 mL IVPB, 2 g, Intravenous, Q24H, Shah, Pratik D, DO   Chlorhexidine  Gluconate Cloth 2 % PADS 6 each, 6 each, Topical, Daily, Maree, Pratik D, DO   divalproex  (DEPAKOTE ) DR tablet 250 mg, 250 mg, Oral, BID, Shah, Pratik D, DO   ferric citrate  (AURYXIA ) tablet 420 mg, 420 mg, Oral, TID WC, Shah, Pratik D, DO   heparin  injection 5,000 Units, 5,000 Units, Subcutaneous, Q8H, Shah, Pratik D, DO   insulin  aspart (novoLOG ) injection 0-5 Units, 0-5 Units, Subcutaneous, QHS, Shah, Pratik D, DO   insulin  aspart (novoLOG ) injection 0-6 Units, 0-6 Units, Subcutaneous, TID WC, Shah, Pratik D, DO   [START ON 07/10/2024] loratadine  (CLARITIN ) tablet 10 mg, 10 mg, Oral, Daily, Maree, Pratik D, DO   [START ON 07/11/2024] midodrine  (PROAMATINE ) tablet 10 mg, 10 mg, Oral, Once per day on Monday Wednesday  Friday, Maree, Pratik D, DO   ondansetron  (ZOFRAN ) tablet 4 mg, 4 mg, Oral, Q6H PRN **OR** ondansetron  (ZOFRAN ) injection 4 mg, 4 mg, Intravenous, Q6H PRN, Maree, Pratik D, DO   sodium chloride  flush (NS) 0.9 % injection 10-40 mL, 10-40 mL, Intracatheter, Q12H, Shah, Pratik D, DO   sodium  chloride flush (NS) 0.9 % injection 10-40 mL, 10-40 mL, Intracatheter, PRN, Maree, Pratik D, DO   tamsulosin  (FLOMAX ) capsule 0.4 mg, 0.4 mg, Oral, QPC supper, Maree, Pratik D, DO

## 2024-07-09 NOTE — H&P (Signed)
 " History and Physical    [name] [name] FMW:[id number] DOB: Jun 12, 1951 DOA: 07/09/2024  PCP: Carlette Benita Area, MD   Patient coming from: Home  Chief Complaint: Abdominal pain  HPI: [name] [name] is a 73 y.o. male with medical history significant for ESRD on HD MWF, type 2 diabetes, hypertension, COPD, CAD, ascites with recurrent paracentesis, and GERD who presented to the ED with complaints of intermittent abdominal discomfort particularly to the left lower quadrant.  He states that he has been having intermittent pain since about 2 weeks prior to Thanksgiving, but denies any fevers or chills.  He states that he had paracentesis about 2 weeks ago and yesterday with 4 L removed.  He denies any nausea, vomiting, diarrhea, or other GI symptoms.  He states that he has been compliant with his hemodialysis and just finished a course of dialysis earlier this morning.  He states he has been compliant with his home medications as well.   ED Course: Vital signs stable and patient is afebrile.  Hemoglobin 10.7 with creatinine 7.53.  Chest x-ray with vascular congestion and interstitial edema noted.  Patient was started on albumin  and Rocephin  per GI recommendations and EDP is discussed with GI with further consultation pending.  Review of Systems: Reviewed as noted above, otherwise negative.  Past Medical History:  Diagnosis Date   Anemia    Arthritis    Asthma    Bell palsy    left side of face- comes and goes per pt on 11/12/23   CAD (coronary artery disease)    a. 2014 MV: abnl w/ infap ischemia; b. 03/2013 Cath: aneurysmal bleb in the LAD w/ otw nonobs dzs-->Med Rx.   Chronic back pain    Chronic kidney disease    on dialysis - mon wed fri   Chronic knee pain    a. 09/2015 s/p R TKA.   Chronic pain    Chronic shoulder pain    Chronic sinusitis    COPD (chronic obstructive pulmonary disease) (HCC)    Diabetes mellitus without complication (HCC)    type II , no meds, does not check  blood sugar   Dyspnea    with exertion   Dysrhythmia    2nd degree AV block   ESRD on peritoneal dialysis (HCC)    on peritoneal dialysis, [facility] - mon wed fri   Essential hypertension    GERD (gastroesophageal reflux disease)    Gout    Hepatomegaly    noted on noncontrast CT 2015   History of hiatal hernia    Hyperlipidemia    Hypothyroidism    Lateral meniscus tear    Myocardial infarction (HCC) 07/2021   Neuromuscular disorder (HCC)    Obesity    Truncal   Obstructive sleep apnea    does not use cpap    On home oxygen  therapy    uses 2L when needed   Pneumonia    PUD (peptic ulcer disease)    remote, reports f/u EGD about 8 years ago unremarkable    Reactive airway disease    related to exposure to chemical during 9/11   Sinusitis    Substance abuse (HCC)    hx in past   Vitamin D  deficiency     Past Surgical History:  Procedure Laterality Date   A/V FISTULAGRAM N/A 02/28/2023   Procedure: A/V Fistulagram;  Surgeon: Lanis Fonda BRAVO, MD;  Location: [facility] INVASIVE CV LAB;  Service: Cardiovascular;  Laterality: N/A;   A/V FISTULAGRAM  N/A 10/22/2023   Procedure: A/V Fistulagram;  Surgeon: Sheree Penne Bruckner, MD;  Location: [facility] INVASIVE CV LAB;  Service: Cardiovascular;  Laterality: N/A;   ASAD LT SHOULDER  12/15/2008   left shoulder   AV FISTULA PLACEMENT Left 08/09/2016   Procedure: BRACHIOCEPHALIC ARTERIOVENOUS (AV) FISTULA CREATION LEFT ARM;  Surgeon: Carlin FORBES Haddock, MD;  Location: [facility] OR;  Service: Vascular;  Laterality: Left;   BASCILIC VEIN TRANSPOSITION Left 11/13/2023   Procedure: TRANSPOSITION, VEIN, BASILIC;  Surgeon: Sheree Penne Bruckner, MD;  Location: [facility] OR;  Service: Vascular;  Laterality: Left;   CAPD INSERTION N/A 10/07/2018   Procedure: LAPAROSCOPIC PERITONEAL CATHETER PLACEMENT;  Surgeon: Stevie Herlene Righter, MD;  Location: [facility] ORS;  Service: General;  Laterality: N/A;   CATARACT EXTRACTION W/PHACO Left 03/28/2016   Procedure:  CATARACT EXTRACTION PHACO AND INTRAOCULAR LENS PLACEMENT LEFT EYE;  Surgeon: Oneil Platts, MD;  Location: [facility] ORS;  Service: Ophthalmology;  Laterality: Left;  CDE: 4.77   CATARACT EXTRACTION W/PHACO Right 04/11/2016   Procedure: CATARACT EXTRACTION PHACO AND INTRAOCULAR LENS PLACEMENT RIGHT EYE; CDE:  4.74;  Surgeon: Oneil Platts, MD;  Location: [facility] ORS;  Service: Ophthalmology;  Laterality: Right;   COLONOSCOPY  10/15/2008   Fields: Rectal polyp obliterated, not retrieved, hemorrhoids, single ascending colon diverticulum near the CV. Next colonoscopy April 2020   COLONOSCOPY N/A 12/25/2014   SLF: 1. Colorectal polyps (2) removed 2. Small internal hemorrhoids 3. the left colon is severely redundant. hyperplastic polyps   CORONARY ARTERY BYPASS GRAFT N/A 06/05/2022   Procedure: OFF PUMP CORONARY ARTERY BYPASS GRAFTING (CABG) X 2 BYPASSES USING LEFT INTERNAL MAMMARY ARTERY AND RIGHT LEG GREATER SAPHENOUS VEIN HARVESTED ENDOSCOPICALLY;  Surgeon: Shyrl Linnie KIDD, MD;  Location: [facility] OR;  Service: Open Heart Surgery;  Laterality: N/A;   CORONARY STENT INTERVENTION N/A 07/25/2021   Procedure: CORONARY STENT INTERVENTION;  Surgeon: Wonda Sharper, MD;  Location: [facility] INVASIVE CV LAB;  Service: Cardiovascular;  Laterality: N/A;   CORONARY STENT INTERVENTION N/A 12/26/2021   Procedure: CORONARY STENT INTERVENTION;  Surgeon: Mady Bruckner, MD;  Location: [facility] INVASIVE CV LAB;  Service: Cardiovascular;  Laterality: N/A;   CORONARY STENT INTERVENTION N/A 01/20/2022   Procedure: CORONARY STENT INTERVENTION;  Surgeon: Wonda Sharper, MD;  Location: [facility] INVASIVE CV LAB;  Service: Cardiovascular;  Laterality: N/A;   DOPPLER ECHOCARDIOGRAPHY     ESOPHAGOGASTRODUODENOSCOPY N/A 12/25/2014   SLF: 1. Anemia most likely due to CRI, gastritis, gastric polyps 2. Moderate non-erosive gastriits and mild duodenitis.  3.TWo large gstric polyps removed.    EYE SURGERY  12/22/2010   tear duct probing-   FOREIGN BODY  REMOVAL  03/29/2011   Procedure: REMOVAL FOREIGN BODY EXTREMITY;  Surgeon: Taft Minerva, MD;  Location: [facility] ORS;  Service: Orthopedics;  Laterality: Right;  Removal Foreign Body Right Thumb   INSERTION OF DIALYSIS CATHETER N/A 11/13/2023   Procedure: INSERTION OF DIALYSIS CATHETER;  Surgeon: Sheree Penne Bruckner, MD;  Location: [facility] OR;  Service: Vascular;  Laterality: N/A;   IR FLUORO GUIDE CV LINE RIGHT  08/06/2018   IR FLUORO GUIDE CV LINE RIGHT  02/17/2023   IR PARACENTESIS  06/26/2024   IR PARACENTESIS  07/08/2024   IR US  GUIDE VASC ACCESS RIGHT  08/06/2018   IR US  GUIDE VASC ACCESS RIGHT  02/17/2023   KNEE ARTHROSCOPY  10/16/2007   left   KNEE ARTHROSCOPY WITH LATERAL MENISECTOMY Right 10/14/2015   Procedure: LEFT KNEE ARTHROSCOPY WITH PARTIAL LATERAL MENISECTOMY;  Surgeon: Taft FORBES Minerva, MD;  Location: [facility]  ORS;  Service: Orthopedics;  Laterality: Right;   LEFT HEART CATH AND CORONARY ANGIOGRAPHY N/A 07/25/2021   Procedure: LEFT HEART CATH AND CORONARY ANGIOGRAPHY;  Surgeon: Wonda Sharper, MD;  Location: [facility] INVASIVE CV LAB;  Service: Cardiovascular;  Laterality: N/A;   LEFT HEART CATH AND CORONARY ANGIOGRAPHY N/A 12/26/2021   Procedure: LEFT HEART CATH AND CORONARY ANGIOGRAPHY;  Surgeon: Mady Bruckner, MD;  Location: [facility] INVASIVE CV LAB;  Service: Cardiovascular;  Laterality: N/A;   LEFT HEART CATH AND CORONARY ANGIOGRAPHY N/A 01/20/2022   Procedure: LEFT HEART CATH AND CORONARY ANGIOGRAPHY;  Surgeon: Wonda Sharper, MD;  Location: [facility] INVASIVE CV LAB;  Service: Cardiovascular;  Laterality: N/A;   LEFT HEART CATHETERIZATION WITH CORONARY ANGIOGRAM N/A 03/28/2013   Procedure: LEFT HEART CATHETERIZATION WITH CORONARY ANGIOGRAM;  Surgeon: Alm LELON Clay, MD;  Location: [facility] CATH LAB;  Service: Cardiovascular;  Laterality: N/A;   NM MYOVIEW  LTD     PENILE PROSTHESIS IMPLANT N/A 08/16/2015   Procedure: PENILE PROTHESIS INFLATABLE, three piece, Excisional biopsy of Penile ulcer,  Penile molding;  Surgeon: Arlena Gal, MD;  Location: [facility] ORS;  Service: Urology;  Laterality: N/A;   PENILE PROSTHESIS IMPLANT N/A 12/24/2017   Procedure: REMOVAL AND  REPLACEMENT  COLOPLAST PENILE PROSTHESIS;  Surgeon: Carolee Sherwood JONETTA DOUGLAS, MD;  Location: [facility] ORS;  Service: Urology;  Laterality: N/A;   PERIPHERAL VASCULAR BALLOON ANGIOPLASTY  02/28/2023   Procedure: PERIPHERAL VASCULAR BALLOON ANGIOPLASTY;  Surgeon: Lanis Fonda BRAVO, MD;  Location: [facility] INVASIVE CV LAB;  Service: Cardiovascular;;   PORT-A-CATH REMOVAL N/A 02/19/2023   Procedure: REMOVAL OF PERITONEAL DIALYSIS CATHETER;  Surgeon: Lanis Fonda BRAVO, MD;  Location: [facility] OR;  Service: Vascular;  Laterality: N/A;   QUADRICEPS TENDON REPAIR  07/21/2011   Procedure: REPAIR QUADRICEP TENDON;  Surgeon: Taft Minerva, MD;  Location: [facility]S;  Service: Orthopedics;  Laterality: Right;   REVISON OF ARTERIOVENOUS FISTULA Left 11/13/2023   Procedure: REVISON OF ARTERIOVENOUS FISTULA;  Surgeon: Sheree Penne Bruckner, MD;  Location: [facility] OR;  Service: Vascular;  Laterality: Left;   RIGHT/LEFT HEART CATH AND CORONARY ANGIOGRAPHY N/A 05/30/2022   Procedure: RIGHT/LEFT HEART CATH AND CORONARY ANGIOGRAPHY;  Surgeon: Dann Candyce RAMAN, MD;  Location: [facility] INVASIVE CV LAB;  Service: Cardiovascular;  Laterality: N/A;   TEE WITHOUT CARDIOVERSION N/A 06/05/2022   Procedure: TRANSESOPHAGEAL ECHOCARDIOGRAM (TEE);  Surgeon: Shyrl Linnie KIDD, MD;  Location: [facility] OR;  Service: Open Heart Surgery;  Laterality: N/A;   TOENAIL EXCISION     removed x2-bilateral   UMBILICAL HERNIA REPAIR  07/17/2005   [place]     reports that he quit smoking about 14 years ago. His smoking use included cigarettes. He started smoking about 39 years ago. He has a 25 pack-year smoking history. He has never used smokeless tobacco. He reports current alcohol  use. He reports current drug use. Drug: Marijuana.  Allergies[1]  Family History  Problem Relation Age of Onset    Hypertension Mother        MI   Cancer Mother        breast    Diabetes Mother    Diabetes Father    Hypertension Father    Hypertension Sister    Diabetes Sister    Arthritis Other    Asthma Other    Lung disease Other    Anesthesia problems Neg Hx    Hypotension Neg Hx    Malignant hyperthermia Neg Hx    Pseudochol deficiency Neg Hx    Colon cancer Neg Hx  Prior to Admission medications  Medication Sig Start Date End Date Taking? Authorizing Provider  acetaminophen  (TYLENOL ) 650 MG CR tablet Take 1,300 mg by mouth every 8 (eight) hours as needed for pain.   Yes [provider]  albuterol  (VENTOLIN  HFA) 108 (90 Base) MCG/ACT inhaler Inhale 2 puffs into the lungs every 6 (six) hours as needed for wheezing or shortness of breath. 08/26/23  Yes Emokpae, Courage, MD  amLODipine  (NORVASC ) 5 MG tablet Take 1 tablet (5 mg total) by mouth daily. 08/26/23  Yes Emokpae, Courage, MD  ascorbic acid  (VITAMIN C ) 500 MG tablet Take 1 tablet (500 mg total) by mouth daily. 05/07/23  Yes Setzer, Nena PARAS, PA-C  aspirin  EC 81 MG tablet Take 81 mg by mouth daily. Swallow whole.   Yes [provider]  atorvastatin  (LIPITOR ) 80 MG tablet Take 1 tablet (80 mg total) by mouth daily. 06/20/22  Yes Dwan Aldo M, PA-C  benzonatate  (TESSALON ) 100 MG capsule Take 100 mg by mouth daily. 04/28/24  Yes [provider]  calcitRIOL  (ROCALTROL ) 0.5 MCG capsule Take 0.5 mcg by mouth daily.   Yes [provider]  carvedilol  (COREG ) 6.25 MG tablet Take 6.25 mg by mouth 2 (two) times daily. 03/27/24  Yes [provider]  divalproex  (DEPAKOTE ) 250 MG DR tablet Take 250 mg by mouth 2 (two) times daily. 03/27/24  Yes [provider]  ferric citrate  (AURYXIA ) 1 GM 210 MG(Fe) tablet Take 420 mg by mouth 3 (three) times daily with meals.   Yes [provider]  loratadine  (CLARITIN ) 10 MG tablet Take 10 mg by mouth daily.   Yes [provider]   midodrine  (PROAMATINE ) 10 MG tablet Take 10 mg by mouth 3 (three) times a week. Pablo [place], and Friday 12/26/23  Yes [provider]  Multiple Vitamins-Minerals (MENS MULTIVITAMIN PO) Take 1 tablet by mouth daily.   Yes [provider]  OVER THE COUNTER MEDICATION Take 2 tablets by mouth daily at 6 (six) AM.   Yes [provider]  tamsulosin  (FLOMAX ) 0.4 MG CAPS capsule Take 0.4 mg by mouth daily after supper. 09/17/23  Yes [provider]  TRELEGY ELLIPTA  200-62.5-25 MCG/ACT AEPB INHALE ONE PUFF INTO THE LUNGS ONCE DAILY 07/02/24  Yes Hope Almarie ORN, NP    Physical Exam: Vitals:   07/09/24 1304 07/09/24 1308 07/09/24 1345 07/09/24 1457  BP: 119/70  110/63 (!) 112/58  Pulse: (!) 58 61 (!) 59 (!) 51  Resp:      Temp:    98.3 F (36.8 C)  TempSrc:    Oral  SpO2: 95% 95% 94% 100%  Weight:      Height:        Constitutional: NAD, calm, comfortable Vitals:   07/09/24 1304 07/09/24 1308 07/09/24 1345 07/09/24 1457  BP: 119/70  110/63 (!) 112/58  Pulse: (!) 58 61 (!) 59 (!) 51  Resp:      Temp:    98.3 F (36.8 C)  TempSrc:    Oral  SpO2: 95% 95% 94% 100%  Weight:      Height:       Eyes: lids and conjunctivae normal Neck: normal, supple Respiratory: clear to auscultation bilaterally. Normal respiratory effort. No accessory muscle use.  Cardiovascular: Regular rate and rhythm, no murmurs. Abdomen: Tenderness to palpation over left lower quadrant, mild to moderate distention. Bowel sounds positive.  Musculoskeletal:  No edema. Skin: no rashes, lesions, ulcers.  Psychiatric: Flat affect  Labs on Admission: I have  personally reviewed following labs and imaging studies  CBC: Recent Labs  Lab 07/09/24 1205  WBC 4.6  NEUTROABS 3.3  HGB 10.7*  HCT 34.8*  MCV 88.3  PLT 188   Basic Metabolic Panel: Recent Labs  Lab 07/09/24 1205  NA 138  K 4.2  CL 98  CO2 25  GLUCOSE 112*  BUN 26*  CREATININE 7.53*  CALCIUM  9.1    GFR: Estimated Creatinine Clearance: 9.1 mL/min (A) (by C-G formula based on SCr of 7.53 mg/dL (H)). Liver Function Tests: Recent Labs  Lab 07/09/24 1205  AST 25  ALT 27  ALKPHOS 96  BILITOT 0.4  PROT 6.6  ALBUMIN  3.2*   No results for input(s): LIPASE, AMYLASE in the last 168 hours. No results for input(s): AMMONIA in the last 168 hours. Coagulation Profile: Recent Labs  Lab 07/09/24 1205  INR 1.1   Cardiac Enzymes: No results for input(s): CKTOTAL, CKMB, CKMBINDEX, TROPONINI in the last 168 hours. BNP (last 3 results) Recent Labs    05/21/24 0811  PROBNP >35,000.0*   HbA1C: No results for input(s): HGBA1C in the last 72 hours. CBG: No results for input(s): GLUCAP in the last 168 hours. Lipid Profile: No results for input(s): CHOL, HDL, LDLCALC, TRIG, CHOLHDL, LDLDIRECT in the last 72 hours. Thyroid  Function Tests: No results for input(s): TSH, T4TOTAL, FREET4, T3FREE, THYROIDAB in the last 72 hours. Anemia Panel: No results for input(s): VITAMINB12, FOLATE, FERRITIN, TIBC, IRON , RETICCTPCT in the last 72 hours. Urine analysis:    Component Value Date/Time   COLORURINE RED (A) 04/08/2023 1230   APPEARANCEUR TURBID (A) 04/08/2023 1230   APPEARANCEUR Clear 08/21/2022 1440   LABSPEC  04/08/2023 1230    TEST NOT REPORTED DUE TO COLOR INTERFERENCE OF URINE PIGMENT   PHURINE  04/08/2023 1230    TEST NOT REPORTED DUE TO COLOR INTERFERENCE OF URINE PIGMENT   GLUCOSEU NEGATIVE 04/08/2023 1230   HGBUR (A) 04/08/2023 1230    TEST NOT REPORTED DUE TO COLOR INTERFERENCE OF URINE PIGMENT   BILIRUBINUR (A) 04/08/2023 1230    TEST NOT REPORTED DUE TO COLOR INTERFERENCE OF URINE PIGMENT   BILIRUBINUR Negative 08/21/2022 1440   KETONESUR (A) 04/08/2023 1230    TEST NOT REPORTED DUE TO COLOR INTERFERENCE OF URINE PIGMENT   PROTEINUR (A) 04/08/2023 1230    TEST NOT REPORTED DUE TO COLOR INTERFERENCE OF URINE PIGMENT    UROBILINOGEN 0.2 01/15/2014 2320   NITRITE (A) 04/08/2023 1230    TEST NOT REPORTED DUE TO COLOR INTERFERENCE OF URINE PIGMENT   LEUKOCYTESUR (A) 04/08/2023 1230    TEST NOT REPORTED DUE TO COLOR INTERFERENCE OF URINE PIGMENT    Radiological Exams on Admission: DG Chest Port 1 View Result Date: 07/09/2024 CLINICAL DATA:  Abdominal pain.  Questionable sepsis. EXAM: PORTABLE CHEST 1 VIEW COMPARISON:  05/21/2024 FINDINGS: Low lung volumes. The cardio pericardial silhouette is enlarged. There is pulmonary vascular congestion without overt pulmonary edema. Diffuse interstitial opacity raises the question of edema. Streaky density in the lung bases compatible with atelectasis. No substantial effusion. Gaseous dilatation of bowel identified in of the left hemidiaphragm. IMPRESSION: 1. Low volume film with vascular congestion and diffuse interstitial opacity suggesting edema. 2. Gaseous dilatation of bowel in the left hemidiaphragm. Electronically Signed   By: Camellia Candle M.D.   On: 07/09/2024 12:56   IR Paracentesis Result Date: 07/08/2024 INDICATION: 73 year old male with ESRD, cirrhosis, and recurrent ascites. Request for therapeutic and diagnostic paracentesis up to 4 L. EXAM: ULTRASOUND GUIDED  PARACENTESIS MEDICATIONS: 10 mL 1% lidocaine  COMPLICATIONS: None immediate. PROCEDURE: Informed written consent was obtained from the patient after a discussion of the risks, benefits and alternatives to treatment. A timeout was performed prior to the initiation of the procedure. Initial ultrasound scanning demonstrates a large amount of ascites within the right lower abdominal quadrant. The right lower abdomen was prepped and draped in the usual sterile fashion. 1% lidocaine  was used for local anesthesia. Following this, a catheter was introduced. An ultrasound image was saved for documentation purposes. The paracentesis was performed. The catheter was removed and a dressing was applied. The patient tolerated  the procedure well without immediate post procedural complication. FINDINGS: A total of approximately 4 L of hazy yellow fluid was removed. Samples were sent to the laboratory as requested by the clinical team. IMPRESSION: Successful ultrasound-guided paracentesis yielding 4 liters of peritoneal fluid. PLAN: The patient has required >/=2 paracenteses in a 30 day period and a formal evaluation by the [facility] has been arranged. Performed by: Aimee Han, PA-C. Electronically Signed   By: JONETTA Faes M.D.   On: 07/08/2024 17:06    Assessment/Plan Principal Problem:   SBP (spontaneous bacterial peritonitis) (HCC) Active Problems:   Hyperlipidemia   Primary hypertension   GERD   OSA on CPAP   CAD (coronary artery disease)   End stage renal disease on dialysis [facility])   Hypertension associated with diabetes (HCC)    Abdominal pain in the setting of ascites with concern for SBP - Patient had 4 L paracentesis on 12/23 and findings are concerning for SBP - No organisms on Gram stain and culture thus far negative - Blood cultures ordered today and are now pending - Continue Rocephin  per GI recommendations - Appreciate further GI recommendations  ESRD currently on HD MWF - Plan for further dialysis by Friday as he has completed full course of hemodialysis on 12/24  HTN - Continue home medications of carvedilol  and amlodipine   COPD - No bronchospasms, breathing treatments as needed  BPH - Continue tamsulosin   CAD with h/o of NSTEMI - Continue statin and beta-blocker  DM2 - SSI with carb modified diet  Morbid Obesity, Class II with OSA -CPAP at bedtime -BMI 37.14   DVT prophylaxis: Heparin  Code Status: Full Family Communication: None at bedside Disposition Plan: Admit for concern for SBP Consults called:GI Admission status: Observation, telemetry  Severity of Illness: The appropriate patient status for this patient is  OBSERVATION. Observation status is judged to be reasonable and necessary in order to provide the required intensity of service to ensure the patient's safety. The patient's presenting symptoms, physical exam findings, and initial radiographic and laboratory data in the context of their medical condition is felt to place them at decreased risk for further clinical deterioration. Furthermore, it is anticipated that the patient will be medically stable for discharge from the hospital within 2 midnights of admission.    Tarik Teixeira D Maree DO [facility]  If 7PM-7AM, please contact night-coverage [email or web address]  07/09/2024, 3:00 PM       [1]  Allergies Allergen Reactions   Tramadol  Itching   Opana [Oxymorphone Hcl] Itching   "

## 2024-07-09 NOTE — Progress Notes (Signed)
" °   07/09/24 1338  TOC Brief Assessment  Insurance and Status Reviewed  Patient has primary care physician Yes  Home environment has been reviewed From Home  Prior level of function: Independent  Prior/Current Home Services No current home services  Social Drivers of Health Review SDOH reviewed no interventions necessary  Readmission risk has been reviewed Yes  Transition of care needs no transition of care needs at this time   Inpatient Care Management (ICM) has reviewed patient and no other ICM needs have been identified at this time. We will continue to monitor patient advancement through interdisciplinary progression rounds. If new patient transition needs arise, please place a ICM consult.  "

## 2024-07-09 NOTE — ED Provider Notes (Signed)
 " [facility] AT [facility] Provider Note   CSN: [id number] Arrival date & time: 07/09/24  1129     Patient presents with: Abdominal Pain   [name] [name] is a 73 y.o. male.   HPI Patient presents with concern for abnormal lab result, ongoing abdominal intermittent discomfort. History is notable for prior infection attributed to peritoneal dialysis catheter, recent paracentesis with abnormal leukocytosis.  Patient denies abdominal pain currently, chest pain, shortness of breath, completed dialysis session today before being made aware of abnormal findings.    Prior to Admission medications  Medication Sig Start Date End Date Taking? Authorizing Provider  acetaminophen  (TYLENOL ) 650 MG CR tablet Take 1,300 mg by mouth every 8 (eight) hours as needed for pain.    [provider]  albuterol  (VENTOLIN  HFA) 108 (90 Base) MCG/ACT inhaler Inhale 2 puffs into the lungs every 6 (six) hours as needed for wheezing or shortness of breath. 08/26/23   Pearlean Manus, MD  amLODipine  (NORVASC ) 5 MG tablet Take 1 tablet (5 mg total) by mouth daily. 08/26/23   Pearlean Manus, MD  ascorbic acid  (VITAMIN C ) 500 MG tablet Take 1 tablet (500 mg total) by mouth daily. 05/07/23   Setzer, Nena PARAS, PA-C  aspirin  EC 81 MG tablet Take 81 mg by mouth daily. Swallow whole.    [provider]  atorvastatin  (LIPITOR ) 80 MG tablet Take 1 tablet (80 mg total) by mouth daily. 06/20/22   Dwan Kyla HERO, PA-C  benzonatate  (TESSALON ) 100 MG capsule Take 100 mg by mouth daily. 04/28/24   [provider]  calcitRIOL  (ROCALTROL ) 0.5 MCG capsule Take 0.5 mcg by mouth daily.    [provider]  carvedilol  (COREG ) 6.25 MG tablet Take 6.25 mg by mouth 2 (two) times daily. 03/27/24   [provider]  divalproex  (DEPAKOTE ) 250 MG DR tablet Take 250 mg by mouth 2 (two) times daily. 03/27/24   [provider]  esomeprazole  (NEXIUM ) 40 MG capsule  Take 1 capsule (40 mg total) by mouth 2 (two) times daily before a meal. 07/16/23   Shirlean Therisa LELON, NP  ferric citrate  (AURYXIA ) 1 GM 210 MG(Fe) tablet Take 420 mg by mouth 3 (three) times daily with meals.    [provider]  loratadine  (CLARITIN ) 10 MG tablet Take 10 mg by mouth daily.    [provider]  midodrine  (PROAMATINE ) 10 MG tablet Take 10 mg by mouth 3 (three) times a week. Pablo Heidelberg, and Friday 12/26/23   [provider]  Multiple Vitamins-Minerals (MENS MULTIVITAMIN PO) Take 1 tablet by mouth daily.    [provider]  OVER THE COUNTER MEDICATION Take 2 tablets by mouth daily at 6 (six) AM.    [provider]  tamsulosin  (FLOMAX ) 0.4 MG CAPS capsule Take 0.4 mg by mouth daily after supper. 09/17/23   [provider]  TRELEGY ELLIPTA  200-62.5-25 MCG/ACT AEPB INHALE ONE PUFF INTO THE LUNGS ONCE DAILY 07/02/24   Hope Almarie LELON, NP    Allergies: Tramadol  and Opana [oxymorphone hcl]    Review of Systems  Updated Vital Signs BP 119/70   Pulse 61   Temp 98 F (36.7 C) (Oral)   Resp 18   Ht 1.626 m (5' 4)   Wt 98.2 kg   SpO2 95%   BMI 37.14 kg/m   Physical Exam Vitals and nursing note reviewed.  Constitutional:      General: He is not in acute distress.  Appearance: He is well-developed.  HENT:     Head: Normocephalic and atraumatic.  Eyes:     Conjunctiva/sclera: Conjunctivae normal.  Cardiovascular:     Rate and Rhythm: Normal rate and regular rhythm.  Pulmonary:     Effort: Pulmonary effort is normal. No respiratory distress.     Breath sounds: No stridor.  Abdominal:     General: There is no distension.     Comments: Protuberant, nontender abdomen.  Skin:    General: Skin is warm and dry.  Neurological:     Mental Status: He is alert and oriented to person, place, and time.     (all labs ordered are listed, but only abnormal results are displayed) Labs Reviewed  COMPREHENSIVE METABOLIC PANEL WITH GFR  - Abnormal; Notable for the following components:      Result Value   Glucose, Bld 112 (*)    BUN 26 (*)    Creatinine, Ser 7.53 (*)    Albumin  3.2 (*)    GFR, Estimated 7 (*)    All other components within normal limits  CBC WITH DIFFERENTIAL/PLATELET - Abnormal; Notable for the following components:   RBC 3.94 (*)    Hemoglobin 10.7 (*)    HCT 34.8 (*)    RDW 17.2 (*)    Lymphs Abs 0.6 (*)    All other components within normal limits  CULTURE, BLOOD (ROUTINE X 2)  CULTURE, BLOOD (ROUTINE X 2)  LACTIC ACID, PLASMA  PROTIME-INR  LACTIC ACID, PLASMA  URINALYSIS, W/ REFLEX TO CULTURE (INFECTION SUSPECTED)    EKG: None  Radiology: [facility] Chest Port 1 View Result Date: 07/09/2024 CLINICAL DATA:  Abdominal pain.  Questionable sepsis. EXAM: PORTABLE CHEST 1 VIEW COMPARISON:  05/21/2024 FINDINGS: Low lung volumes. The cardio pericardial silhouette is enlarged. There is pulmonary vascular congestion without overt pulmonary edema. Diffuse interstitial opacity raises the question of edema. Streaky density in the lung bases compatible with atelectasis. No substantial effusion. Gaseous dilatation of bowel identified in of the left hemidiaphragm. IMPRESSION: 1. Low volume film with vascular congestion and diffuse interstitial opacity suggesting edema. 2. Gaseous dilatation of bowel in the left hemidiaphragm. Electronically Signed   By: Camellia Candle M.D.   On: 07/09/2024 12:56   IR Paracentesis Result Date: 07/08/2024 INDICATION: 73 year old male with ESRD, cirrhosis, and recurrent ascites. Request for therapeutic and diagnostic paracentesis up to 4 L. EXAM: ULTRASOUND GUIDED  PARACENTESIS MEDICATIONS: 10 mL 1% lidocaine  COMPLICATIONS: None immediate. PROCEDURE: Informed written consent was obtained from the patient after a discussion of the risks, benefits and alternatives to treatment. A timeout was performed prior to the initiation of the procedure. Initial ultrasound scanning demonstrates a large  amount of ascites within the right lower abdominal quadrant. The right lower abdomen was prepped and draped in the usual sterile fashion. 1% lidocaine  was used for local anesthesia. Following this, a catheter was introduced. An ultrasound image was saved for documentation purposes. The paracentesis was performed. The catheter was removed and a dressing was applied. The patient tolerated the procedure well without immediate post procedural complication. FINDINGS: A total of approximately 4 L of hazy yellow fluid was removed. Samples were sent to the laboratory as requested by the clinical team. IMPRESSION: Successful ultrasound-guided paracentesis yielding 4 liters of peritoneal fluid. PLAN: The patient has required >/=2 paracenteses in a 30 day period and a formal evaluation by the [facility] has been arranged. Performed by: Aimee Han, PA-C. Electronically Signed   By: JONETTA  Johann M.D.   On: 07/08/2024 17:06     Procedures   Medications Ordered in the ED  albumin  human 25 % solution 50 g (has no administration in time range)  cefTRIAXone  (ROCEPHIN ) 2 g in sodium chloride  0.9 % 100 mL IVPB (2 g Intravenous New Bag/Given 07/09/24 1302)                                    Medical Decision Making Adult male with recent peritonitis presents with protuberant abdomen, concern for possible spontaneous bacterial peritonitis, bacteremia, sepsis. Initial vitals are reassuring, but given his relatively immunocompromise status, HD necessity, patient had labs, monitoring, blood culture sent. Case discussed with his GI team.  Amount and/or Complexity of Data Reviewed External Data Reviewed: notes. Labs: ordered. Decision-making details documented in ED Course. Radiology: ordered. Decision-making details documented in ED Course.    Details: I reviewed the paracentesis notes  Risk Prescription drug management. Decision regarding hospitalization. Diagnosis  or treatment significantly limited by social determinants of health.   1:33 PM On repeat exam patient in similar condition.  I reviewed his culture results from recent paracentesis, discussed them with his GI physician, patient will start empiric antibiotics for SBP, albumin  repletion ordered as well. Patient was admitted to our medicine colleagues, with anticipated GI consultation, consideration of ID consultation as well given patient's concern for SBP, though with no culture results yet available as well as consideration of the patient's known left AV fistula for dialysis.     Final diagnoses:  SBP (spontaneous bacterial peritonitis) [facility])    ED Discharge Orders     None          Garrick Charleston, MD 07/09/24 1334  "

## 2024-07-09 NOTE — ED Triage Notes (Signed)
 Pt arrived via POV c/o LLQ abdominal pain and reports this pain is recurrent. Pt reports receiving a call from his doctor advising him to go to the ER. Pt reports he did complete dialysis this morning. Pt reports his abdomen is bigger and tighter than usual.

## 2024-07-10 DIAGNOSIS — K219 Gastro-esophageal reflux disease without esophagitis: Secondary | ICD-10-CM | POA: Diagnosis present

## 2024-07-10 DIAGNOSIS — J4489 Other specified chronic obstructive pulmonary disease: Secondary | ICD-10-CM | POA: Diagnosis present

## 2024-07-10 DIAGNOSIS — Z992 Dependence on renal dialysis: Secondary | ICD-10-CM | POA: Diagnosis not present

## 2024-07-10 DIAGNOSIS — Z8249 Family history of ischemic heart disease and other diseases of the circulatory system: Secondary | ICD-10-CM | POA: Diagnosis not present

## 2024-07-10 DIAGNOSIS — E1159 Type 2 diabetes mellitus with other circulatory complications: Secondary | ICD-10-CM | POA: Diagnosis present

## 2024-07-10 DIAGNOSIS — E785 Hyperlipidemia, unspecified: Secondary | ICD-10-CM | POA: Diagnosis present

## 2024-07-10 DIAGNOSIS — G4733 Obstructive sleep apnea (adult) (pediatric): Secondary | ICD-10-CM | POA: Diagnosis present

## 2024-07-10 DIAGNOSIS — Z7982 Long term (current) use of aspirin: Secondary | ICD-10-CM | POA: Diagnosis not present

## 2024-07-10 DIAGNOSIS — K652 Spontaneous bacterial peritonitis: Secondary | ICD-10-CM | POA: Diagnosis present

## 2024-07-10 DIAGNOSIS — K7460 Unspecified cirrhosis of liver: Secondary | ICD-10-CM | POA: Diagnosis present

## 2024-07-10 DIAGNOSIS — E66812 Obesity, class 2: Secondary | ICD-10-CM | POA: Diagnosis present

## 2024-07-10 DIAGNOSIS — N186 End stage renal disease: Secondary | ICD-10-CM | POA: Diagnosis present

## 2024-07-10 DIAGNOSIS — D631 Anemia in chronic kidney disease: Secondary | ICD-10-CM | POA: Diagnosis present

## 2024-07-10 DIAGNOSIS — I152 Hypertension secondary to endocrine disorders: Secondary | ICD-10-CM | POA: Diagnosis present

## 2024-07-10 DIAGNOSIS — Z6837 Body mass index (BMI) 37.0-37.9, adult: Secondary | ICD-10-CM | POA: Diagnosis not present

## 2024-07-10 DIAGNOSIS — E1122 Type 2 diabetes mellitus with diabetic chronic kidney disease: Secondary | ICD-10-CM | POA: Diagnosis present

## 2024-07-10 DIAGNOSIS — I441 Atrioventricular block, second degree: Secondary | ICD-10-CM | POA: Diagnosis present

## 2024-07-10 DIAGNOSIS — E039 Hypothyroidism, unspecified: Secondary | ICD-10-CM | POA: Diagnosis present

## 2024-07-10 DIAGNOSIS — I2510 Atherosclerotic heart disease of native coronary artery without angina pectoris: Secondary | ICD-10-CM | POA: Diagnosis present

## 2024-07-10 DIAGNOSIS — R188 Other ascites: Secondary | ICD-10-CM | POA: Diagnosis present

## 2024-07-10 DIAGNOSIS — Z833 Family history of diabetes mellitus: Secondary | ICD-10-CM | POA: Diagnosis not present

## 2024-07-10 DIAGNOSIS — N400 Enlarged prostate without lower urinary tract symptoms: Secondary | ICD-10-CM | POA: Diagnosis present

## 2024-07-10 DIAGNOSIS — I252 Old myocardial infarction: Secondary | ICD-10-CM | POA: Diagnosis not present

## 2024-07-10 DIAGNOSIS — I350 Nonrheumatic aortic (valve) stenosis: Secondary | ICD-10-CM | POA: Diagnosis present

## 2024-07-10 LAB — COMPREHENSIVE METABOLIC PANEL WITH GFR
ALT: 24 U/L (ref 0–44)
AST: 21 U/L (ref 15–41)
Albumin: 3.2 g/dL — ABNORMAL LOW (ref 3.5–5.0)
Alkaline Phosphatase: 92 U/L (ref 38–126)
Anion gap: 10 (ref 5–15)
BUN: 32 mg/dL — ABNORMAL HIGH (ref 8–23)
CO2: 32 mmol/L (ref 22–32)
Calcium: 9.4 mg/dL (ref 8.9–10.3)
Chloride: 98 mmol/L (ref 98–111)
Creatinine, Ser: 9.28 mg/dL — ABNORMAL HIGH (ref 0.61–1.24)
GFR, Estimated: 5 mL/min — ABNORMAL LOW
Glucose, Bld: 102 mg/dL — ABNORMAL HIGH (ref 70–99)
Potassium: 4.7 mmol/L (ref 3.5–5.1)
Sodium: 139 mmol/L (ref 135–145)
Total Bilirubin: 0.3 mg/dL (ref 0.0–1.2)
Total Protein: 6.3 g/dL — ABNORMAL LOW (ref 6.5–8.1)

## 2024-07-10 LAB — GLUCOSE, CAPILLARY
Glucose-Capillary: 91 mg/dL (ref 70–99)
Glucose-Capillary: 107 mg/dL — ABNORMAL HIGH (ref 70–99)
Glucose-Capillary: 123 mg/dL — ABNORMAL HIGH (ref 70–99)
Glucose-Capillary: 78 mg/dL (ref 70–99)

## 2024-07-10 LAB — CBC
HCT: 34.4 % — ABNORMAL LOW (ref 39.0–52.0)
Hemoglobin: 10.6 g/dL — ABNORMAL LOW (ref 13.0–17.0)
MCH: 27.5 pg (ref 26.0–34.0)
MCHC: 30.8 g/dL (ref 30.0–36.0)
MCV: 89.4 fL (ref 80.0–100.0)
Platelets: 170 K/uL (ref 150–400)
RBC: 3.85 MIL/uL — ABNORMAL LOW (ref 4.22–5.81)
RDW: 17.2 % — ABNORMAL HIGH (ref 11.5–15.5)
WBC: 4.2 K/uL (ref 4.0–10.5)
nRBC: 0 % (ref 0.0–0.2)

## 2024-07-10 LAB — MAGNESIUM: Magnesium: 2.3 mg/dL (ref 1.7–2.4)

## 2024-07-10 LAB — HEPATITIS B SURFACE ANTIGEN: Hepatitis B Surface Ag: NONREACTIVE

## 2024-07-10 MED ORDER — MIDODRINE HCL 5 MG PO TABS
10.0000 mg | ORAL_TABLET | Freq: Once | ORAL | Status: AC
  Administered 2024-07-10: 10 mg via ORAL
  Filled 2024-07-10: qty 2

## 2024-07-10 MED ORDER — ALBUMIN HUMAN 25 % IV SOLN
50.0000 g | Freq: Four times a day (QID) | INTRAVENOUS | Status: DC
  Administered 2024-07-10 – 2024-07-11 (×5): 50 g via INTRAVENOUS
  Filled 2024-07-10 (×4): qty 200

## 2024-07-10 MED ORDER — CHLORHEXIDINE GLUCONATE CLOTH 2 % EX PADS
6.0000 | MEDICATED_PAD | Freq: Every day | CUTANEOUS | Status: DC
  Administered 2024-07-10 – 2024-07-11 (×2): 6 via TOPICAL

## 2024-07-10 NOTE — Progress Notes (Signed)
 ID BRIEF NOTE  Date of Admission:  07/09/2024   Total days of inpatient antibiotics 2        Reason for Consult: Secondary peritonitis    Principal Problem:   SBP (spontaneous bacterial peritonitis) (HCC) Active Problems:   Hyperlipidemia   Primary hypertension   GERD   OSA on CPAP   CAD (coronary artery disease)   End stage renal disease on dialysis (HCC)   Hypertension associated with diabetes (HCC)   Nonrheumatic aortic valve stenosis   Assessment: 73 year old male with history of ESRD on HD, type 2 diabetes hypertension, COPD, CAD, ascites with recurrent paracentesis, GERD presents with complaint of intermittent abdominal pain and knee pain.  ID engaged as patient has been on cefepime  outpatient and found to have elevated nucleated cell paracentesis on 12/23 removing 4 L. #History of recurrent paracentesis, ascites suspected to be secondary versus spontaneous bacterial peritonitis #Decompensated cirrhosis - Peritoneal cultures from 02/15/2023, 06/26/2024, 07/08/2024 have all been negative. - There is an increase in nucleated cells within 12/11 1499 to1565 on 07/08/2024 despite being on cefepime .  Patient continues to have abdominal pain. -GI onboard recc ID engagement #History of Candida glabrata and enterococcal bacteremia and August 2024 - Transient bacteremia thought to be secondary to peritonitis. peritoneal cultures no growth during that admission Recommendations:  -Do not see infectious etiology for peritonitis as cultures remain negative. Pt currently on ceftriaxone  okay to continue. - Follow-up blood cultures  - Obtain paracentesis with cytology, cell count with cultures, AFB/fungal/bacterial cultures, glucose, protein, albumin , LDH - Defer to GI or further noninfectious workup -Plan communicated to primary Microbiology:   Antibiotics: Ceftraixone  Cultures: Blood  Urine  Other   HPI: [name] [name] is a 73 y.o. male male with past medical history of ESRD on HD  Monday Wednesday Friday, diabetes type 2, hypertension, COPD, CAD, ascites with recurrent paracentesis, GERD complaints.  Discomfort particularly left lower quadrant.  Patient has been admitted with concern for refractory at this BP in setting of situs had been on Rocephin  empirically.  Noted that patient has been on cefepime  outpatient on c and continues to have elevated cell count compared to health fluid paracentesis 12/30, cultures no growth.  GI recommended ID consultation for secondary peritonitis.   Past Medical History:  Diagnosis Date   Anemia    Arthritis    Asthma    Bell palsy    left side of face- comes and goes per pt on 11/12/23   CAD (coronary artery disease)    a. 2014 MV: abnl w/ infap ischemia; b. 03/2013 Cath: aneurysmal bleb in the LAD w/ otw nonobs dzs-->Med Rx.   Chronic back pain    Chronic kidney disease    on dialysis - mon wed fri   Chronic knee pain    a. 09/2015 s/p R TKA.   Chronic pain    Chronic shoulder pain    Chronic sinusitis    COPD (chronic obstructive pulmonary disease) (HCC)    Diabetes mellitus without complication (HCC)    type II , no meds, does not check blood sugar   Dyspnea    with exertion   Dysrhythmia    2nd degree AV block   ESRD on peritoneal dialysis (HCC)    on peritoneal dialysis, [facility] - mon wed fri   Essential hypertension    GERD (gastroesophageal reflux disease)    Gout    Hepatomegaly    noted on noncontrast CT 2015   History of hiatal hernia  Hyperlipidemia    Hypothyroidism    Lateral meniscus tear    Myocardial infarction (HCC) 07/2021   Neuromuscular disorder (HCC)    Obesity    Truncal   Obstructive sleep apnea    does not use cpap    On home oxygen  therapy    uses 2L when needed   Pneumonia    PUD (peptic ulcer disease)    remote, reports f/u EGD about 8 years ago unremarkable    Reactive airway disease    related to exposure to chemical during 9/11   Sinusitis    Substance abuse (HCC)     hx in past   Vitamin D  deficiency     [Social History]  [Social History] Tobacco Use   Smoking status: Former    Current packs/day: 0.00    Average packs/day: 1 pack/day for 25.0 years (25.0 ttl pk-yrs)    Types: Cigarettes    Start date: 03/27/1985    Quit date: 03/27/2010    Years since quitting: 14.2   Smokeless tobacco: Never   Tobacco comments:    Quit in 2011  Vaping Use   Vaping status: Never Used  Substance Use Topics   Alcohol  use: Yes    Comment: occasionally liquor   Drug use: Yes    Types: Marijuana    Comment: cocaine- last time used- 11/24/2017 , marijuana- last use 11/10/23 withhold 24 hrs prior to procedure    Family History  Problem Relation Age of Onset   Hypertension Mother        MI   Cancer Mother        breast    Diabetes Mother    Diabetes Father    Hypertension Father    Hypertension Sister    Diabetes Sister    Arthritis Other    Asthma Other    Lung disease Other    Anesthesia problems Neg Hx    Hypotension Neg Hx    Malignant hyperthermia Neg Hx    Pseudochol deficiency Neg Hx    Colon cancer Neg Hx    Scheduled Meds:  ascorbic acid   500 mg Oral Daily   aspirin  EC  81 mg Oral Daily   atorvastatin   80 mg Oral Daily   budesonide -glycopyrrolate -formoterol   2 puff Inhalation BID   calcitRIOL   0.5 mcg Oral Daily   Chlorhexidine  Gluconate Cloth  6 each Topical Daily   Chlorhexidine  Gluconate Cloth  6 each Topical Q0600   divalproex   250 mg Oral BID   ferric citrate   420 mg Oral TID WC   heparin   5,000 Units Subcutaneous Q8H   insulin  aspart  0-5 Units Subcutaneous QHS   insulin  aspart  0-6 Units Subcutaneous TID WC   loratadine   10 mg Oral Daily   [START ON 07/11/2024] midodrine   10 mg Oral Once per day on Monday Wednesday Friday   midodrine   10 mg Oral Once   sodium chloride  flush  10-40 mL Intracatheter Q12H   tamsulosin   0.4 mg Oral QPC supper   Continuous Infusions:  albumin  human 50 g (07/10/24 1343)   cefTRIAXone  (ROCEPHIN )   IV 2 g (07/10/24 0901)   PRN Meds:.acetaminophen , albuterol , ondansetron  **OR** ondansetron  (ZOFRAN ) IV, sodium chloride  flush [Allergies]  [Allergies] Allergen Reactions   Tramadol  Itching   Opana [Oxymorphone Hcl] Itching      Lab Results Lab Results  Component Value Date   WBC 4.2 07/10/2024   HGB 10.6 (L) 07/10/2024   HCT 34.4 (L) 07/10/2024   MCV  89.4 07/10/2024   PLT 170 07/10/2024    Lab Results  Component Value Date   CREATININE 9.28 (H) 07/10/2024   BUN 32 (H) 07/10/2024   NA 139 07/10/2024   K 4.7 07/10/2024   CL 98 07/10/2024   CO2 32 07/10/2024    Lab Results  Component Value Date   ALT 24 07/10/2024   AST 21 07/10/2024   GGT 88 (H) 06/21/2023   ALKPHOS 92 07/10/2024   BILITOT 0.3 07/10/2024       Loney Stank, MD [facility] 07/10/2024, 3:49 PM

## 2024-07-10 NOTE — Progress Notes (Signed)
 " PROGRESS NOTE    [name] [name]  FMW:[id number] DOB: [birth year] DOA: 07/09/2024 PCP: Carlette Benita Area, MD   Brief Narrative:    [name] [name] is a 73 y.o. male with medical history significant for ESRD on HD MWF, type 2 diabetes, hypertension, COPD, CAD, ascites with recurrent paracentesis, and GERD who presented to the ED with complaints of intermittent abdominal discomfort particularly to the left lower quadrant.  Patient has been admitted with concerns for refractory SBP in the setting of ascites and has been started on Rocephin  empirically.  He has intermittently been on cefepime  outpatient and continues to have elevated cell count on peritoneal fluid with last paracentesis performed 12/23 with 4 L removed.  No growth on cultures noted on the fluid or in blood culture.  GI following with recommendations to consult ID who is recommending further studies on peritoneal fluid which have been ordered, but he may require a repeat diagnostic paracentesis by 12/26 to acquire the data.  He was noted to have some hypotension requiring repeat albumin  infusion.  Assessment & Plan:   Principal Problem:   SBP (spontaneous bacterial peritonitis) (HCC) Active Problems:   Hyperlipidemia   Primary hypertension   GERD   OSA on CPAP   CAD (coronary artery disease)   End stage renal disease on dialysis [facility])   Hypertension associated with diabetes (HCC)   Nonrheumatic aortic valve stenosis  Assessment and Plan:   Abdominal pain in the setting of ascites with concern for SBP - Patient had 4 L paracentesis on 12/23 and findings are concerning for SBP - No organisms on Gram stain and culture thus far negative - Blood cultures ordered today and are now pending - Continue Rocephin  per GI recommendations - Appreciate further GI recommendations   ESRD currently on HD MWF - Nephrology consulted with plans for hemodialysis 12/26  Mild bradycardia in the setting of Mobitz 2 AV block -Continue  telemetry monitoring -EKG with second-degree AV block which appears chronic - Plan to hold Coreg    Hypotension in setting of HTN - Hold carvedilol  and amlodipine  given some bradycardia and hypotension -Continue albumin  infusions and give midodrine  today   COPD - No bronchospasms, breathing treatments as needed   BPH - Continue tamsulosin    CAD with h/o of NSTEMI - Continue statin and beta-blocker   DM2 - SSI with carb modified diet   Morbid Obesity, Class II with OSA -CPAP at bedtime -BMI 37.14    DVT prophylaxis: Heparin  Code Status: Full Family Communication: None at bedside Disposition Plan:  Status is: Inpatient Remains inpatient appropriate because: Need for IV medications   Consultants:  GI ID-to place recommendations Nephrology  Procedures:  None  Antimicrobials:  Anti-infectives (From admission, onward)    Start     Dose/Rate Route Frequency Ordered Stop   07/10/24 1000  cefTRIAXone  (ROCEPHIN ) 2 g in sodium chloride  0.9 % 100 mL IVPB        2 g 200 mL/hr over 30 Minutes Intravenous Every 24 hours 07/09/24 1506     07/09/24 1230  cefTRIAXone  (ROCEPHIN ) 2 g in sodium chloride  0.9 % 100 mL IVPB        2 g 200 mL/hr over 30 Minutes Intravenous Once 07/09/24 1220 07/09/24 1332       Subjective: Patient seen and evaluated today with no new acute complaints or concerns and stated that his abdominal pain is feeling better.  He was noted to develop some mild hypotension as well as bradycardia later in  the afternoon and was complaining of lightheadedness.  Objective: Vitals:   07/09/24 1948 07/10/24 0013 07/10/24 0412 07/10/24 1253  BP: 109/63 103/79 98/70 (!) 100/51  Pulse: 61 60 (!) 52 (!) 49  Resp: 20 20    Temp: 97.8 F (36.6 C) 97.7 F (36.5 C) 98.1 F (36.7 C) 98 F (36.7 C)  TempSrc: Oral Oral Oral Oral  SpO2: 96% 95% 98% 92%  Weight:      Height:        Intake/Output Summary (Last 24 hours) at 07/10/2024 1430 Last data filed at  07/09/2024 2125 Gross per 24 hour  Intake 378.03 ml  Output --  Net 378.03 ml   Filed Weights   07/09/24 1153  Weight: 98.2 kg    Examination:  General exam: Appears calm and comfortable  Respiratory system: Clear to auscultation. Respiratory effort normal. Cardiovascular system: S1 & S2 heard, RRR.  Gastrointestinal system: Abdomen is minimally tender and moderately distended Central nervous system: Alert and awake Extremities: No edema Skin: No significant lesions noted Psychiatry: Flat affect.    Data Reviewed: I have personally reviewed following labs and imaging studies  CBC: Recent Labs  Lab 07/09/24 1205 07/10/24 0418  WBC 4.6 4.2  NEUTROABS 3.3  --   HGB 10.7* 10.6*  HCT 34.8* 34.4*  MCV 88.3 89.4  PLT 188 170   Basic Metabolic Panel: Recent Labs  Lab 07/09/24 1205 07/10/24 0418  NA 138 139  K 4.2 4.7  CL 98 98  CO2 25 32  GLUCOSE 112* 102*  BUN 26* 32*  CREATININE 7.53* 9.28*  CALCIUM  9.1 9.4  MG  --  2.3   GFR: Estimated Creatinine Clearance: 7.4 mL/min (A) (by C-G formula based on SCr of 9.28 mg/dL (H)). Liver Function Tests: Recent Labs  Lab 07/09/24 1205 07/10/24 0418  AST 25 21  ALT 27 24  ALKPHOS 96 92  BILITOT 0.4 0.3  PROT 6.6 6.3*  ALBUMIN  3.2* 3.2*   No results for input(s): LIPASE, AMYLASE in the last 168 hours. No results for input(s): AMMONIA in the last 168 hours. Coagulation Profile: Recent Labs  Lab 07/09/24 1205  INR 1.1   Cardiac Enzymes: No results for input(s): CKTOTAL, CKMB, CKMBINDEX, TROPONINI in the last 168 hours. BNP (last 3 results) Recent Labs    05/21/24 0811  PROBNP >35,000.0*   HbA1C: No results for input(s): HGBA1C in the last 72 hours. CBG: Recent Labs  Lab 07/09/24 1607 07/09/24 2117 07/10/24 0725 07/10/24 1120  GLUCAP 81 111* 91 107*   Lipid Profile: No results for input(s): CHOL, HDL, LDLCALC, TRIG, CHOLHDL, LDLDIRECT in the last 72 hours. Thyroid   Function Tests: No results for input(s): TSH, T4TOTAL, FREET4, T3FREE, THYROIDAB in the last 72 hours. Anemia Panel: No results for input(s): VITAMINB12, FOLATE, FERRITIN, TIBC, IRON , RETICCTPCT in the last 72 hours. Sepsis Labs: Recent Labs  Lab 07/09/24 1205  LATICACIDVEN 1.6    Recent Results (from the past 240 hours)  Culture, body fluid w Gram Stain-bottle     Status: None (Preliminary result)   Collection Time: 07/08/24  1:37 PM   Specimen: Peritoneal Washings  Result Value Ref Range Status   Specimen Description PERITONEAL  Final   Special Requests BOTTLES DRAWN AEROBIC AND ANAEROBIC  Final   Culture   Final    NO GROWTH 2 DAYS Performed at [facility], [address] [place], [place] [address]    Report Status PENDING  Incomplete  Gram stain  Status: None   Collection Time: 07/08/24  1:37 PM   Specimen: Peritoneal Washings  Result Value Ref Range Status   Specimen Description PERITONEAL  Final   Special Requests NONE  Final   Gram Stain   Final    WBC PRESENT, PREDOMINANTLY PMN NO ORGANISMS SEEN CYTOSPIN SMEAR Performed at [facility], [address], [place], [place] [address]    Report Status 07/08/2024 FINAL  Final  Blood Culture (routine x 2)     Status: None (Preliminary result)   Collection Time: 07/09/24 12:05 PM   Specimen: BLOOD  Result Value Ref Range Status   Specimen Description BLOOD RIGHT ASSIST CONTROL  Final   Special Requests   Final    BOTTLES DRAWN AEROBIC AND ANAEROBIC Blood Culture adequate volume   Culture   Final    NO GROWTH < 24 HOURS Performed at [facility], [address], [place], [place] [address]    Report Status PENDING  Incomplete  Blood Culture (routine x 2)     Status: None (Preliminary result)   Collection Time: 07/09/24 12:24 PM   Specimen: BLOOD  Result Value Ref Range Status   Specimen Description BLOOD RIGHT ANTECUBITAL  Final   Special Requests   Final    BOTTLES DRAWN  AEROBIC ONLY Blood Culture adequate volume   Culture   Final    NO GROWTH < 24 HOURS Performed at [facility], [address], [place], [place] [address]    Report Status PENDING  Incomplete         Radiology Studies: DG Chest Port 1 View Result Date: 07/09/2024 CLINICAL DATA:  Abdominal pain.  Questionable sepsis. EXAM: PORTABLE CHEST 1 VIEW COMPARISON:  05/21/2024 FINDINGS: Low lung volumes. The cardio pericardial silhouette is enlarged. There is pulmonary vascular congestion without overt pulmonary edema. Diffuse interstitial opacity raises the question of edema. Streaky density in the lung bases compatible with atelectasis. No substantial effusion. Gaseous dilatation of bowel identified in of the left hemidiaphragm. IMPRESSION: 1. Low volume film with vascular congestion and diffuse interstitial opacity suggesting edema. 2. Gaseous dilatation of bowel in the left hemidiaphragm. Electronically Signed   By: Camellia Candle M.D.   On: 07/09/2024 12:56        Scheduled Meds:  amLODipine   5 mg Oral Daily   ascorbic acid   500 mg Oral Daily   aspirin  EC  81 mg Oral Daily   atorvastatin   80 mg Oral Daily   budesonide -glycopyrrolate -formoterol   2 puff Inhalation BID   calcitRIOL   0.5 mcg Oral Daily   carvedilol   6.25 mg Oral BID WC   Chlorhexidine  Gluconate Cloth  6 each Topical Daily   Chlorhexidine  Gluconate Cloth  6 each Topical Q0600   divalproex   250 mg Oral BID   ferric citrate   420 mg Oral TID WC   heparin   5,000 Units Subcutaneous Q8H   insulin  aspart  0-5 Units Subcutaneous QHS   insulin  aspart  0-6 Units Subcutaneous TID WC   loratadine   10 mg Oral Daily   [START ON 07/11/2024] midodrine   10 mg Oral Once per day on Monday Wednesday Friday   midodrine   10 mg Oral Once   sodium chloride  flush  10-40 mL Intracatheter Q12H   tamsulosin   0.4 mg Oral QPC supper   Continuous Infusions:  albumin  human 50 g (07/10/24 1343)   cefTRIAXone  (ROCEPHIN )  IV 2 g (07/10/24 0901)      LOS: 0 days    Time spent: 67  minutes    Carly Applegate JONETTA Fairly, DO [facility]  If 7PM-7AM, please contact night-coverage [email or web address] 07/10/2024, 2:30 PM   "

## 2024-07-10 NOTE — Consult Note (Deleted)
 "        [facility]    Date of Admission:  07/09/2024   Total days of inpatient antibiotics 2        Reason for Consult: Secondary peritonitis    Principal Problem:   SBP (spontaneous bacterial peritonitis) (HCC) Active Problems:   Hyperlipidemia   Primary hypertension   GERD   OSA on CPAP   CAD (coronary artery disease)   End stage renal disease on dialysis (HCC)   Hypertension associated with diabetes (HCC)   Nonrheumatic aortic valve stenosis   Assessment: 74 year old male with history of ESRD on HD, type 2 diabetes hypertension, COPD, CAD, ascites with recurrent paracentesis, GERD presents with complaint of intermittent abdominal pain and knee pain.  ID engaged as patient has been on cefepime  outpatient and found to have elevated nucleated cell paracentesis on 12/23 removing 4 L. #History of recurrent paracentesis, ascites suspected to be secondary versus spontaneous bacterial peritonitis #Decompensated cirrhosis - Peritoneal cultures from 02/15/2023, 06/26/2024, 07/08/2024 have all been negative. - There is an increase in nucleated cells within 12/11 1499 [id number] on 07/08/2024 despite being on cefepime .  Patient continues to have abdominal pain. -GI onboard recc ID engagement #History of Candida glabrata and enterococcal bacteremia and August 2024 - Transient bacteremia thought to be secondary to peritonitis. peritoneal cultures no growth during that admission Recommendations:  -Do not see infectious etiology for peritonitis as cultures remain negative. Pt currently on ceftriaxone  okay to continue. - Follow-up blood cultures  - Obtain paracentesis with cytology, cell count with cultures, AFB/fungal/bacterial cultures, glucose, protein, albumin , LDH - Defer to GI or further noninfectious workup -Plan communicated to primary Microbiology:   Antibiotics: Ceftraixone  Cultures: Blood  Urine  Other   HPI: [name] [name] is a 73 y.o. male male  with past medical history of ESRD on HD Monday Wednesday Friday, diabetes type 2, hypertension, COPD, CAD, ascites with recurrent paracentesis, GERD complaints.  Discomfort particularly left lower quadrant.  Patient has been admitted with concern for refractory at this BP in setting of situs had been on Rocephin  empirically.  Noted that patient has been on cefepime  outpatient on c and continues to have elevated cell count compared to health fluid paracentesis 12/30, cultures no growth.  GI recommended ID consultation for secondary peritonitis.   Past Medical History:  Diagnosis Date   Anemia    Arthritis    Asthma    Bell palsy    left side of face- comes and goes per pt on 11/12/23   CAD (coronary artery disease)    a. 2014 MV: abnl w/ infap ischemia; b. 03/2013 Cath: aneurysmal bleb in the LAD w/ otw nonobs dzs-->Med Rx.   Chronic back pain    Chronic kidney disease    on dialysis - mon wed fri   Chronic knee pain    a. 09/2015 s/p R TKA.   Chronic pain    Chronic shoulder pain    Chronic sinusitis    COPD (chronic obstructive pulmonary disease) (HCC)    Diabetes mellitus without complication (HCC)    type II , no meds, does not check blood sugar   Dyspnea    with exertion   Dysrhythmia    2nd degree AV block   ESRD on peritoneal dialysis (HCC)    on peritoneal dialysis, [facility] - mon wed fri   Essential hypertension    GERD (gastroesophageal reflux disease)    Gout    Hepatomegaly  noted on noncontrast CT 2015   History of hiatal hernia    Hyperlipidemia    Hypothyroidism    Lateral meniscus tear    Myocardial infarction (HCC) 07/2021   Neuromuscular disorder (HCC)    Obesity    Truncal   Obstructive sleep apnea    does not use cpap    On home oxygen  therapy    uses 2L when needed   Pneumonia    PUD (peptic ulcer disease)    remote, reports f/u EGD about 8 years ago unremarkable    Reactive airway disease    related to exposure to chemical during 9/11    Sinusitis    Substance abuse (HCC)    hx in past   Vitamin D  deficiency     Social History[1]  Family History  Problem Relation Age of Onset   Hypertension Mother        MI   Cancer Mother        breast    Diabetes Mother    Diabetes Father    Hypertension Father    Hypertension Sister    Diabetes Sister    Arthritis Other    Asthma Other    Lung disease Other    Anesthesia problems Neg Hx    Hypotension Neg Hx    Malignant hyperthermia Neg Hx    Pseudochol deficiency Neg Hx    Colon cancer Neg Hx    Scheduled Meds:  ascorbic acid   500 mg Oral Daily   aspirin  EC  81 mg Oral Daily   atorvastatin   80 mg Oral Daily   budesonide -glycopyrrolate -formoterol   2 puff Inhalation BID   calcitRIOL   0.5 mcg Oral Daily   Chlorhexidine  Gluconate Cloth  6 each Topical Daily   Chlorhexidine  Gluconate Cloth  6 each Topical Q0600   divalproex   250 mg Oral BID   ferric citrate   420 mg Oral TID WC   heparin   5,000 Units Subcutaneous Q8H   insulin  aspart  0-5 Units Subcutaneous QHS   insulin  aspart  0-6 Units Subcutaneous TID WC   loratadine   10 mg Oral Daily   [START ON 07/11/2024] midodrine   10 mg Oral Once per day on Monday Wednesday Friday   midodrine   10 mg Oral Once   sodium chloride  flush  10-40 mL Intracatheter Q12H   tamsulosin   0.4 mg Oral QPC supper   Continuous Infusions:  albumin  human 50 g (07/10/24 1343)   cefTRIAXone  (ROCEPHIN )  IV 2 g (07/10/24 0901)   PRN Meds:.acetaminophen , albuterol , ondansetron  **OR** ondansetron  (ZOFRAN ) IV, sodium chloride  flush Allergies[2]    Lab Results Lab Results  Component Value Date   WBC 4.2 07/10/2024   HGB 10.6 (L) 07/10/2024   HCT 34.4 (L) 07/10/2024   MCV 89.4 07/10/2024   PLT 170 07/10/2024    Lab Results  Component Value Date   CREATININE 9.28 (H) 07/10/2024   BUN 32 (H) 07/10/2024   NA 139 07/10/2024   K 4.7 07/10/2024   CL 98 07/10/2024   CO2 32 07/10/2024    Lab Results  Component Value Date   ALT 24  07/10/2024   AST 21 07/10/2024   GGT 88 (H) 06/21/2023   ALKPHOS 92 07/10/2024   BILITOT 0.3 07/10/2024       Loney Stank, MD [facility] 07/10/2024, 3:49 PM     [1]  Social History Tobacco Use   Smoking status: Former    Current packs/day: 0.00  Average packs/day: 1 pack/day for 25.0 years (25.0 ttl pk-yrs)    Types: Cigarettes    Start date: 03/27/1985    Quit date: 03/27/2010    Years since quitting: 14.2   Smokeless tobacco: Never   Tobacco comments:    Quit in 2011  Vaping Use   Vaping status: Never Used  Substance Use Topics   Alcohol  use: Yes    Comment: occasionally liquor   Drug use: Yes    Types: Marijuana    Comment: cocaine- last time used- 11/24/2017 , marijuana- last use 11/10/23 withhold 24 hrs prior to procedure  [2]  Allergies Allergen Reactions   Tramadol  Itching   Opana [Oxymorphone Hcl] Itching   "

## 2024-07-10 NOTE — Plan of Care (Signed)
  Problem: Clinical Measurements: Goal: Will remain free from infection Outcome: Progressing   Problem: Activity: Goal: Risk for activity intolerance will decrease Outcome: Progressing   Problem: Coping: Goal: Level of anxiety will decrease Outcome: Progressing   Problem: Pain Managment: Goal: General experience of comfort will improve and/or be controlled Outcome: Progressing   Problem: Safety: Goal: Ability to remain free from injury will improve Outcome: Progressing   Problem: Skin Integrity: Goal: Risk for impaired skin integrity will decrease Outcome: Progressing   Problem: Skin Integrity: Goal: Risk for impaired skin integrity will decrease Outcome: Progressing

## 2024-07-10 NOTE — Plan of Care (Signed)
 Problem: Education: Goal: Knowledge of General Education information will improve Description: Including pain rating scale, medication(s)/side effects and non-pharmacologic comfort measures Outcome: Progressing   Problem: Health Behavior/Discharge Planning: Goal: Ability to manage health-related needs will improve Outcome: Progressing   Problem: Clinical Measurements: Goal: Ability to maintain clinical measurements within normal limits will improve Outcome: Progressing Goal: Will remain free from infection Outcome: Progressing Goal: Diagnostic test results will improve Outcome: Progressing Goal: Respiratory complications will improve Outcome: Progressing Goal: Cardiovascular complication will be avoided Outcome: Progressing   Problem: Activity: Goal: Risk for activity intolerance will decrease Outcome: Progressing   Problem: Nutrition: Goal: Adequate nutrition will be maintained Outcome: Progressing   Problem: Coping: Goal: Level of anxiety will decrease Outcome: Progressing   Problem: Elimination: Goal: Will not experience complications related to bowel motility Outcome: Progressing Goal: Will not experience complications related to urinary retention Outcome: Progressing   Problem: Pain Managment: Goal: General experience of comfort will improve and/or be controlled Outcome: Progressing   Problem: Safety: Goal: Ability to remain free from injury will improve Outcome: Progressing   Problem: Skin Integrity: Goal: Risk for impaired skin integrity will decrease Outcome: Progressing   Problem: Education: Goal: Ability to describe self-care measures that may prevent or decrease complications (Diabetes Survival Skills Education) will improve Outcome: Progressing Goal: Individualized Educational Video(s) Outcome: Progressing   Problem: Coping: Goal: Ability to adjust to condition or change in health will improve Outcome: Progressing   Problem: Fluid  Volume: Goal: Ability to maintain a balanced intake and output will improve Outcome: Progressing   Problem: Health Behavior/Discharge Planning: Goal: Ability to identify and utilize available resources and services will improve Outcome: Progressing Goal: Ability to manage health-related needs will improve Outcome: Progressing   Problem: Metabolic: Goal: Ability to maintain appropriate glucose levels will improve Outcome: Progressing   Problem: Nutritional: Goal: Maintenance of adequate nutrition will improve Outcome: Progressing Goal: Progress toward achieving an optimal weight will improve Outcome: Progressing   Problem: Skin Integrity: Goal: Risk for impaired skin integrity will decrease Outcome: Progressing   Problem: Tissue Perfusion: Goal: Adequacy of tissue perfusion will improve Outcome: Progressing   Problem: Education: Goal: Knowledge of General Education information will improve Description: Including pain rating scale, medication(s)/side effects and non-pharmacologic comfort measures Outcome: Progressing   Problem: Health Behavior/Discharge Planning: Goal: Ability to manage health-related needs will improve Outcome: Progressing   Problem: Clinical Measurements: Goal: Ability to maintain clinical measurements within normal limits will improve Outcome: Progressing Goal: Will remain free from infection Outcome: Progressing Goal: Diagnostic test results will improve Outcome: Progressing Goal: Respiratory complications will improve Outcome: Progressing Goal: Cardiovascular complication will be avoided Outcome: Progressing   Problem: Activity: Goal: Risk for activity intolerance will decrease Outcome: Progressing   Problem: Nutrition: Goal: Adequate nutrition will be maintained Outcome: Progressing   Problem: Coping: Goal: Level of anxiety will decrease Outcome: Progressing   Problem: Elimination: Goal: Will not experience complications related to  bowel motility Outcome: Progressing Goal: Will not experience complications related to urinary retention Outcome: Progressing   Problem: Pain Managment: Goal: General experience of comfort will improve and/or be controlled Outcome: Progressing   Problem: Safety: Goal: Ability to remain free from injury will improve Outcome: Progressing   Problem: Skin Integrity: Goal: Risk for impaired skin integrity will decrease Outcome: Progressing   Problem: Education: Goal: Ability to describe self-care measures that may prevent or decrease complications (Diabetes Survival Skills Education) will improve Outcome: Progressing Goal: Individualized Educational Video(s) Outcome: Progressing   Problem: Coping: Goal: Ability  to adjust to condition or change in health will improve Outcome: Progressing   Problem: Fluid Volume: Goal: Ability to maintain a balanced intake and output will improve Outcome: Progressing   Problem: Health Behavior/Discharge Planning: Goal: Ability to identify and utilize available resources and services will improve Outcome: Progressing Goal: Ability to manage health-related needs will improve Outcome: Progressing   Problem: Metabolic: Goal: Ability to maintain appropriate glucose levels will improve Outcome: Progressing   Problem: Nutritional: Goal: Maintenance of adequate nutrition will improve Outcome: Progressing Goal: Progress toward achieving an optimal weight will improve Outcome: Progressing   Problem: Skin Integrity: Goal: Risk for impaired skin integrity will decrease Outcome: Progressing   Problem: Tissue Perfusion: Goal: Adequacy of tissue perfusion will improve Outcome: Progressing

## 2024-07-10 NOTE — H&P (Signed)
 Reason for Consult: ESRD Referring Physician:  Dr. Adron Fairly  Chief Complaint: Abdominal discomfort   Dialysis Orders: Center: [facility]  on MWF. EDW 97.5kg HD Bath 2K/2.5 Ca  Time 3:45 Heparin  1000 units bolus then 500 units/hr. Access LUE AVF BFR 400 DFR 600    Calcitriol  2 mcg MWF Sensipar 30 mg MWF  Assessment/Plan:  Acute hypoxic respiratory failure - CXR with pulmonary edema.  Will plan for HD today and UF as tolerated and re-evaluate his oxygen  requirements.  Chills/myalgias/malaise -  possibly related to covid vaccine.  Workup per primary svc  ESRD -  Plan for HD to keep on MWF schedule -His true EDW is likely less than what is  as he has pulmonary congestion as well as edema in lower extremities despite being below his EDW after treatment on Wednesday.  EDW for him did not always be easy to calculate because of the ascites which accumulates failure rapidly. -Plan on treatment tomorrow; no absolute indicationtoday.  -Monitor Daily I/Os, Daily weight  -Maintain MAP>65 for optimal renal perfusion.  - Avoid nephrotoxic agents such as IV contrast, NSAIDs, and phosphate containing bowel preps (FLEETS)    Hypertension/volume  - as above, plan for HD with UF tomorrow; restart home regimen.  Anemia  - Hgb 10.6, no ESA  Metabolic bone disease -   resume home meds; will check a phos tomorrow.  Nutrition - renal diet, carb modified.   HPI: [name] [name] is an 73 y.o. male with a past medical history significant for DM, HTN, CAD, COPD, cirrhosis, obesity, OSA not on CPAP, HLD, hypothyroidism, PUD, and ESRD on HD MWF at [facility] with a full treatment yesterday but now presenting with abdominal discomfort especially in the left lower quadrant starting intermittently a few weeks before Thanksgiving.  He denies any fever, chills, nausea, vomiting.  He does require paracentesis intermittently with LVP 2 weeks ago as well as Tuesday with 4 L removed.  Early a.m. on Wednesday  for full treatment leaving below his EDW at approximately 98 kg but his EDW is probably difficult to calculate as he often has fluid in his abdomen.    Chest x-ray in the ED showed vascular congestion and interstitial edema, started on albumin  and Rocephin  after consultation with GI.  Blood pressure was in the 101-112/55-63 range, afebrile in the ED as well as during his hospitalization with a white count of 4.6, hemoglobin of 10.7, BUN/creatinine of 26/7.53 with a potassium of 4.2 and a bicarb of 25  ROS Pertinent items are noted in HPI.  Chemistry and CBC: Creat  Date/Time Value Ref Range Status  02/09/2020 11:26 AM 18.89 (H) 0.70 - 1.25 mg/dL Final    Comment:    Verified by repeat analysis. . For patients >11 years of age, the reference limit for Creatinine is approximately 13% higher for people identified as African-American. SABRA   04/09/2019 10:23 AM 7.52 (H) 0.70 - 1.25 mg/dL Final    Comment:    For patients >86 years of age, the reference limit for Creatinine is approximately 13% higher for people identified as African-American. SABRA   10/23/2017 07:19 AM 5.77 (H) 0.70 - 1.25 mg/dL Final    Comment:    For patients >15 years of age, the reference limit for Creatinine is approximately 13% higher for people identified as African-American. .   06/26/2017 11:06 AM 5.76 (H) 0.70 - 1.25 mg/dL Final    Comment:    For patients >51 years of age, the  reference limit for Creatinine is approximately 13% higher for people identified as African-American. .   03/28/2017 09:40 AM 5.03 (H) 0.70 - 1.25 mg/dL Final    Comment:    For patients >52 years of age, the reference limit for Creatinine is approximately 13% higher for people identified as African-American. .   09/15/2016 09:40 AM 5.08 (H) 0.70 - 1.25 mg/dL Final    Comment:      For patients > or = 74 years of age: The upper reference limit for Creatinine is approximately 13% higher for people identified  as African-American.     06/16/2016 09:56 AM 3.74 (H) 0.70 - 1.25 mg/dL Final    Comment:      For patients > or = 74 years of age: The upper reference limit for Creatinine is approximately 13% higher for people identified as African-American.     03/13/2016 08:40 AM 3.41 (H) 0.70 - 1.25 mg/dL Final    Comment:      For patients > or = 74 years of age: The upper reference limit for Creatinine is approximately 13% higher for people identified as African-American.     12/03/2015 09:24 AM 4.04 (H) 0.70 - 1.25 mg/dL Final  97/81/7982 90:64 AM 2.89 (H) 0.70 - 1.25 mg/dL Final  89/71/7983 91:44 AM 2.88 (H) 0.70 - 1.25 mg/dL Final  90/90/7985 89:45 AM 1.69 (H) 0.50 - 1.35 mg/dL Final   Creatinine, Ser  Date/Time Value Ref Range Status  07/10/2024 04:18 AM 9.28 (H) 0.61 - 1.24 mg/dL Final  87/75/7974 87:94 PM 7.53 (H) 0.61 - 1.24 mg/dL Final  87/87/7974 88:79 AM 6.25 (H) 0.61 - 1.24 mg/dL Final  88/93/7974 87:90 AM 10.10 (H) 0.61 - 1.24 mg/dL Final  88/94/7974 88:43 PM 9.66 (H) 0.61 - 1.24 mg/dL Final  88/94/7974 91:88 AM 11.40 (H) 0.61 - 1.24 mg/dL Final  90/69/7974 89:64 AM 9.77 (H) 0.61 - 1.24 mg/dL Final  94/94/7974 89:61 AM 5.52 (H) 0.61 - 1.24 mg/dL Final  95/70/7974 93:85 AM 10.30 (H) 0.61 - 1.24 mg/dL Final  95/92/7974 92:88 AM 13.20 (H) 0.61 - 1.24 mg/dL Final  96/93/7974 91:54 AM 7.95 (H) 0.61 - 1.24 mg/dL Final  97/91/7974 96:68 AM 6.03 (H) 0.61 - 1.24 mg/dL Final  97/92/7974 96:66 PM 4.82 (H) 0.61 - 1.24 mg/dL Final  89/69/7975 90:81 AM 7.62 (H) 0.76 - 1.27 mg/dL Final  89/80/7975 92:49 AM 11.05 (H) 0.61 - 1.24 mg/dL Final  89/82/7975 91:79 AM 10.92 (H) 0.61 - 1.24 mg/dL Final  89/87/7975 89:62 AM 11.50 (H) 0.61 - 1.24 mg/dL Final  89/89/7975 98:69 PM 10.77 (H) 0.61 - 1.24 mg/dL Final  89/91/7975 98:57 PM 10.93 (H) 0.61 - 1.24 mg/dL Final  89/97/7975 89:98 AM 9.50 (H) 0.61 - 1.24 mg/dL Final  90/71/7975 88:47 AM 12.62 (H) 0.61 - 1.24 mg/dL Final  90/74/7975 89:91  AM 12.26 (H) 0.61 - 1.24 mg/dL Final  90/76/7975 87:80 PM 12.78 (H) 0.61 - 1.24 mg/dL Final  90/77/7975 96:82 PM 10.93 (H) 0.61 - 1.24 mg/dL Final    Comment:    DELTA CHECK NOTED  04/06/2023 01:30 PM 7.07 (H) 0.61 - 1.24 mg/dL Final  90/84/7975 91:83 AM 8.09 (H) 0.61 - 1.24 mg/dL Final    Comment:    DELTA CHECK NOTED  03/31/2023 02:20 PM 2.13 (H) 0.61 - 1.24 mg/dL Final    Comment:    DIALYSIS REPEATED TO VERIFY   03/29/2023 03:28 PM 10.88 (H) 0.61 - 1.24 mg/dL Final  90/92/7975 90:74 AM 8.85 (H) 0.61 -  1.24 mg/dL Final  90/94/7975 87:64 PM 9.84 (H) 0.61 - 1.24 mg/dL Final  91/68/7975 97:49 PM 10.46 (H) 0.61 - 1.24 mg/dL Final  91/70/7975 98:69 PM 10.85 (H) 0.61 - 1.24 mg/dL Final  91/72/7975 91:83 AM 11.38 (H) 0.61 - 1.24 mg/dL Final  91/75/7975 91:54 AM 11.16 (H) 0.61 - 1.24 mg/dL Final  91/76/7975 90:48 AM 9.24 (H) 0.61 - 1.24 mg/dL Final  91/77/7975 87:65 AM 7.98 (H) 0.61 - 1.24 mg/dL Final  91/79/7975 92:76 AM 9.83 (H) 0.61 - 1.24 mg/dL Final    Comment:    DELTA CHECK NOTED  03/04/2023 12:03 AM 5.16 (H) 0.61 - 1.24 mg/dL Final  91/82/7975 98:55 AM 8.26 (H) 0.61 - 1.24 mg/dL Final  91/83/7975 90:89 AM 6.59 (H) 0.61 - 1.24 mg/dL Final  91/83/7975 97:89 AM 5.73 (H) 0.61 - 1.24 mg/dL Final  91/84/7975 96:92 AM 8.74 (H) 0.61 - 1.24 mg/dL Final  91/85/7975 98:70 AM 6.49 (H) 0.61 - 1.24 mg/dL Final  91/88/7975 94:62 AM 3.48 (H) 0.61 - 1.24 mg/dL Final  91/89/7975 96:66 AM 6.82 (H) 0.61 - 1.24 mg/dL Final  91/90/7975 96:47 AM 12.36 (H) 0.61 - 1.24 mg/dL Final  91/91/7975 87:76 AM 10.57 (H) 0.61 - 1.24 mg/dL Final  91/92/7975 95:92 AM 8.88 (H) 0.61 - 1.24 mg/dL Final  91/93/7975 94:61 AM 11.53 (H) 0.61 - 1.24 mg/dL Final  91/94/7975 95:41 AM 11.66 (H) 0.61 - 1.24 mg/dL Final  91/95/7975 95:74 AM 9.61 (H) 0.61 - 1.24 mg/dL Final  91/96/7975 88:67 PM 8.36 (H) 0.61 - 1.24 mg/dL Final   Recent Labs  Lab 07/09/24 1205 07/10/24 0418  NA 138 139  K 4.2 4.7  CL 98 98  CO2 25  32  GLUCOSE 112* 102*  BUN 26* 32*  CREATININE 7.53* 9.28*  CALCIUM  9.1 9.4   Recent Labs  Lab 07/09/24 1205 07/10/24 0418  WBC 4.6 4.2  NEUTROABS 3.3  --   HGB 10.7* 10.6*  HCT 34.8* 34.4*  MCV 88.3 89.4  PLT 188 170   Liver Function Tests: Recent Labs  Lab 07/09/24 1205 07/10/24 0418  AST 25 21  ALT 27 24  ALKPHOS 96 92  BILITOT 0.4 0.3  PROT 6.6 6.3*  ALBUMIN  3.2* 3.2*   No results for input(s): LIPASE, AMYLASE in the last 168 hours. No results for input(s): AMMONIA in the last 168 hours. Cardiac Enzymes: No results for input(s): CKTOTAL, CKMB, CKMBINDEX, TROPONINI in the last 168 hours. Iron  Studies: No results for input(s): IRON , TIBC, TRANSFERRIN, FERRITIN in the last 72 hours. PT/INR: @LABRCNTIP (inr:5)  Xrays/Other Studies: ) Results for orders placed or performed during the hospital encounter of 07/09/24 (from the past 48 hours)  Lactic acid, plasma     Status: None   Collection Time: 07/09/24 12:05 PM  Result Value Ref Range   Lactic Acid, Venous 1.6 0.5 - 1.9 mmol/L    Comment: Performed at [facility], [address], [place], [place] [address]  Comprehensive metabolic panel     Status: Abnormal   Collection Time: 07/09/24 12:05 PM  Result Value Ref Range   Sodium 138 135 - 145 mmol/L   Potassium 4.2 3.5 - 5.1 mmol/L   Chloride 98 98 - 111 mmol/L   CO2 25 22 - 32 mmol/L   Glucose, Bld 112 (H) 70 - 99 mg/dL    Comment: Glucose reference range applies only to samples taken after fasting for at least 8 hours.   BUN 26 (H) 8 - 23 mg/dL   Creatinine,  Ser 7.53 (H) 0.61 - 1.24 mg/dL   Calcium  9.1 8.9 - 10.3 mg/dL   Total Protein 6.6 6.5 - 8.1 g/dL   Albumin  3.2 (L) 3.5 - 5.0 g/dL   AST 25 15 - 41 U/L   ALT 27 0 - 44 U/L   Alkaline Phosphatase 96 38 - 126 U/L   Total Bilirubin 0.4 0.0 - 1.2 mg/dL   GFR, Estimated 7 (L) >60 mL/min    Comment: (NOTE) Calculated using the CKD-EPI Creatinine Equation (2021)    Anion gap 15 5 - 15     Comment: Performed at [facility], [address], [place], [place] [address]  CBC with Differential     Status: Abnormal   Collection Time: 07/09/24 12:05 PM  Result Value Ref Range   WBC 4.6 4.0 - 10.5 K/uL   RBC 3.94 (L) 4.22 - 5.81 MIL/uL   Hemoglobin 10.7 (L) 13.0 - 17.0 g/dL   HCT 65.1 (L) 60.9 - 47.9 %   MCV 88.3 80.0 - 100.0 fL   MCH 27.2 26.0 - 34.0 pg   MCHC 30.7 30.0 - 36.0 g/dL   RDW 82.7 (H) 88.4 - 84.4 %   Platelets 188 150 - 400 K/uL   nRBC 0.0 0.0 - 0.2 %   Neutrophils Relative % 72 %   Neutro Abs 3.3 1.7 - 7.7 K/uL   Lymphocytes Relative 13 %   Lymphs Abs 0.6 (L) 0.7 - 4.0 K/uL   Monocytes Relative 11 %   Monocytes Absolute 0.5 0.1 - 1.0 K/uL   Eosinophils Relative 2 %   Eosinophils Absolute 0.1 0.0 - 0.5 K/uL   Basophils Relative 1 %   Basophils Absolute 0.0 0.0 - 0.1 K/uL   Immature Granulocytes 1 %   Abs Immature Granulocytes 0.04 0.00 - 0.07 K/uL    Comment: Performed at [facility], [address], [place], [place] [address]  Protime-INR     Status: None   Collection Time: 07/09/24 12:05 PM  Result Value Ref Range   Prothrombin Time 15.0 11.4 - 15.2 seconds   INR 1.1 0.8 - 1.2    Comment: (NOTE) INR goal varies based on device and disease states. Performed at [facility], [address], [place], [place] [address]   Blood Culture (routine x 2)     Status: None (Preliminary result)   Collection Time: 07/09/24 12:05 PM   Specimen: BLOOD  Result Value Ref Range   Specimen Description BLOOD RIGHT ASSIST CONTROL    Special Requests      BOTTLES DRAWN AEROBIC AND ANAEROBIC Blood Culture adequate volume   Culture      NO GROWTH < 24 HOURS Performed at [facility], [address], [place], [place] [address]    Report Status PENDING   Blood Culture (routine x 2)     Status: None (Preliminary result)   Collection Time: 07/09/24 12:24 PM   Specimen: BLOOD  Result Value Ref Range   Specimen Description BLOOD RIGHT ANTECUBITAL    Special Requests       BOTTLES DRAWN AEROBIC ONLY Blood Culture adequate volume   Culture      NO GROWTH < 24 HOURS Performed at [facility], [address] [place], [place] [address]    Report Status PENDING   Glucose, capillary     Status: None   Collection Time: 07/09/24  4:07 PM  Result Value Ref Range   Glucose-Capillary 81 70 - 99 mg/dL    Comment: Glucose reference range  applies only to samples taken after fasting for at least 8 hours.  Glucose, capillary     Status: Abnormal   Collection Time: 07/09/24  9:17 PM  Result Value Ref Range   Glucose-Capillary 111 (H) 70 - 99 mg/dL    Comment: Glucose reference range applies only to samples taken after fasting for at least 8 hours.  Magnesium      Status: None   Collection Time: 07/10/24  4:18 AM  Result Value Ref Range   Magnesium  2.3 1.7 - 2.4 mg/dL    Comment: Performed at [facility], [address], [place], [place] [address]  Comprehensive metabolic panel     Status: Abnormal   Collection Time: 07/10/24  4:18 AM  Result Value Ref Range   Sodium 139 135 - 145 mmol/L   Potassium 4.7 3.5 - 5.1 mmol/L   Chloride 98 98 - 111 mmol/L   CO2 32 22 - 32 mmol/L   Glucose, Bld 102 (H) 70 - 99 mg/dL    Comment: Glucose reference range applies only to samples taken after fasting for at least 8 hours.   BUN 32 (H) 8 - 23 mg/dL   Creatinine, Ser 0.71 (H) 0.61 - 1.24 mg/dL   Calcium  9.4 8.9 - 10.3 mg/dL   Total Protein 6.3 (L) 6.5 - 8.1 g/dL   Albumin  3.2 (L) 3.5 - 5.0 g/dL   AST 21 15 - 41 U/L   ALT 24 0 - 44 U/L   Alkaline Phosphatase 92 38 - 126 U/L   Total Bilirubin 0.3 0.0 - 1.2 mg/dL   GFR, Estimated 5 (L) >60 mL/min    Comment: (NOTE) Calculated using the CKD-EPI Creatinine Equation (2021)    Anion gap 10 5 - 15    Comment: Performed at [facility], [address], [place], [place] [address]  CBC     Status: Abnormal   Collection Time: 07/10/24  4:18 AM  Result Value Ref Range   WBC 4.2 4.0 - 10.5 K/uL   RBC 3.85 (L) 4.22 - 5.81 MIL/uL    Hemoglobin 10.6 (L) 13.0 - 17.0 g/dL   HCT 65.5 (L) 60.9 - 47.9 %   MCV 89.4 80.0 - 100.0 fL   MCH 27.5 26.0 - 34.0 pg   MCHC 30.8 30.0 - 36.0 g/dL   RDW 82.7 (H) 88.4 - 84.4 %   Platelets 170 150 - 400 K/uL   nRBC 0.0 0.0 - 0.2 %    Comment: Performed at [facility], [address], [place], [place] [address]  Glucose, capillary     Status: None   Collection Time: 07/10/24  7:25 AM  Result Value Ref Range   Glucose-Capillary 91 70 - 99 mg/dL    Comment: Glucose reference range applies only to samples taken after fasting for at least 8 hours.   Comment 1 Notify RN    Comment 2 Document in Chart    *Note: Due to a large number of results and/or encounters for the requested time period, some results have not been displayed. A complete set of results can be found in Results Review.   DG Chest Port 1 View Result Date: 07/09/2024 CLINICAL DATA:  Abdominal pain.  Questionable sepsis. EXAM: PORTABLE CHEST 1 VIEW COMPARISON:  05/21/2024 FINDINGS: Low lung volumes. The cardio pericardial silhouette is enlarged. There is pulmonary vascular congestion without overt pulmonary edema. Diffuse interstitial opacity raises the question of edema. Streaky density in the lung bases compatible with atelectasis. No substantial effusion. Gaseous dilatation  of bowel identified in of the left hemidiaphragm. IMPRESSION: 1. Low volume film with vascular congestion and diffuse interstitial opacity suggesting edema. 2. Gaseous dilatation of bowel in the left hemidiaphragm. Electronically Signed   By: Camellia Candle M.D.   On: 07/09/2024 12:56   IR Paracentesis Result Date: 07/08/2024 INDICATION: 73 year old male with ESRD, cirrhosis, and recurrent ascites. Request for therapeutic and diagnostic paracentesis up to 4 L. EXAM: ULTRASOUND GUIDED  PARACENTESIS MEDICATIONS: 10 mL 1% lidocaine  COMPLICATIONS: None immediate. PROCEDURE: Informed written consent was obtained from the patient after a discussion of the risks,  benefits and alternatives to treatment. A timeout was performed prior to the initiation of the procedure. Initial ultrasound scanning demonstrates a large amount of ascites within the right lower abdominal quadrant. The right lower abdomen was prepped and draped in the usual sterile fashion. 1% lidocaine  was used for local anesthesia. Following this, a catheter was introduced. An ultrasound image was saved for documentation purposes. The paracentesis was performed. The catheter was removed and a dressing was applied. The patient tolerated the procedure well without immediate post procedural complication. FINDINGS: A total of approximately 4 L of hazy yellow fluid was removed. Samples were sent to the laboratory as requested by the clinical team. IMPRESSION: Successful ultrasound-guided paracentesis yielding 4 liters of peritoneal fluid. PLAN: The patient has required >/=2 paracenteses in a 30 day period and a formal evaluation by the [facility] has been arranged. Performed by: Aimee Han, PA-C. Electronically Signed   By: JONETTA Faes M.D.   On: 07/08/2024 17:06    PMH:   Past Medical History:  Diagnosis Date   Anemia    Arthritis    Asthma    Bell palsy    left side of face- comes and goes per pt on 11/12/23   CAD (coronary artery disease)    a. 2014 MV: abnl w/ infap ischemia; b. 03/2013 Cath: aneurysmal bleb in the LAD w/ otw nonobs dzs-->Med Rx.   Chronic back pain    Chronic kidney disease    on dialysis - mon wed fri   Chronic knee pain    a. 09/2015 s/p R TKA.   Chronic pain    Chronic shoulder pain    Chronic sinusitis    COPD (chronic obstructive pulmonary disease) (HCC)    Diabetes mellitus without complication (HCC)    type II , no meds, does not check blood sugar   Dyspnea    with exertion   Dysrhythmia    2nd degree AV block   ESRD on peritoneal dialysis (HCC)    on peritoneal dialysis, [facility] - mon wed fri    Essential hypertension    GERD (gastroesophageal reflux disease)    Gout    Hepatomegaly    noted on noncontrast CT 2015   History of hiatal hernia    Hyperlipidemia    Hypothyroidism    Lateral meniscus tear    Myocardial infarction (HCC) 07/2021   Neuromuscular disorder (HCC)    Obesity    Truncal   Obstructive sleep apnea    does not use cpap    On home oxygen  therapy    uses 2L when needed   Pneumonia    PUD (peptic ulcer disease)    remote, reports f/u EGD about 8 years ago unremarkable    Reactive airway disease    related to exposure to chemical during 9/11   Sinusitis    Substance abuse (HCC)  hx in past   Vitamin D  deficiency     PSH:   Past Surgical History:  Procedure Laterality Date   A/V FISTULAGRAM N/A 02/28/2023   Procedure: A/V Fistulagram;  Surgeon: Lanis Fonda BRAVO, MD;  Location: [facility] INVASIVE CV LAB;  Service: Cardiovascular;  Laterality: N/A;   A/V FISTULAGRAM N/A 10/22/2023   Procedure: A/V Fistulagram;  Surgeon: Sheree Penne Bruckner, MD;  Location: [facility] INVASIVE CV LAB;  Service: Cardiovascular;  Laterality: N/A;   ASAD LT SHOULDER  12/15/2008   left shoulder   AV FISTULA PLACEMENT Left 08/09/2016   Procedure: BRACHIOCEPHALIC ARTERIOVENOUS (AV) FISTULA CREATION LEFT ARM;  Surgeon: Carlin BRAVO Haddock, MD;  Location: [facility] OR;  Service: Vascular;  Laterality: Left;   BASCILIC VEIN TRANSPOSITION Left 11/13/2023   Procedure: TRANSPOSITION, VEIN, BASILIC;  Surgeon: Sheree Penne Bruckner, MD;  Location: [facility] OR;  Service: Vascular;  Laterality: Left;   CAPD INSERTION N/A 10/07/2018   Procedure: LAPAROSCOPIC PERITONEAL CATHETER PLACEMENT;  Surgeon: Stevie Herlene Righter, MD;  Location: [facility] ORS;  Service: General;  Laterality: N/A;   CATARACT EXTRACTION W/PHACO Left 03/28/2016   Procedure: CATARACT EXTRACTION PHACO AND INTRAOCULAR LENS PLACEMENT LEFT EYE;  Surgeon: Oneil Platts, MD;  Location: [facility] ORS;  Service: Ophthalmology;  Laterality: Left;  CDE: 4.77    CATARACT EXTRACTION W/PHACO Right 04/11/2016   Procedure: CATARACT EXTRACTION PHACO AND INTRAOCULAR LENS PLACEMENT RIGHT EYE; CDE:  4.74;  Surgeon: Oneil Platts, MD;  Location: [facility] ORS;  Service: Ophthalmology;  Laterality: Right;   COLONOSCOPY  10/15/2008   Fields: Rectal polyp obliterated, not retrieved, hemorrhoids, single ascending colon diverticulum near the CV. Next colonoscopy April 2020   COLONOSCOPY N/A 12/25/2014   SLF: 1. Colorectal polyps (2) removed 2. Small internal hemorrhoids 3. the left colon is severely redundant. hyperplastic polyps   CORONARY ARTERY BYPASS GRAFT N/A 06/05/2022   Procedure: OFF PUMP CORONARY ARTERY BYPASS GRAFTING (CABG) X 2 BYPASSES USING LEFT INTERNAL MAMMARY ARTERY AND RIGHT LEG GREATER SAPHENOUS VEIN HARVESTED ENDOSCOPICALLY;  Surgeon: Shyrl Linnie KIDD, MD;  Location: [facility] OR;  Service: Open Heart Surgery;  Laterality: N/A;   CORONARY STENT INTERVENTION N/A 07/25/2021   Procedure: CORONARY STENT INTERVENTION;  Surgeon: Wonda Sharper, MD;  Location: [facility] INVASIVE CV LAB;  Service: Cardiovascular;  Laterality: N/A;   CORONARY STENT INTERVENTION N/A 12/26/2021   Procedure: CORONARY STENT INTERVENTION;  Surgeon: Mady Bruckner, MD;  Location: [facility] INVASIVE CV LAB;  Service: Cardiovascular;  Laterality: N/A;   CORONARY STENT INTERVENTION N/A 01/20/2022   Procedure: CORONARY STENT INTERVENTION;  Surgeon: Wonda Sharper, MD;  Location: [facility] INVASIVE CV LAB;  Service: Cardiovascular;  Laterality: N/A;   DOPPLER ECHOCARDIOGRAPHY     ESOPHAGOGASTRODUODENOSCOPY N/A 12/25/2014   SLF: 1. Anemia most likely due to CRI, gastritis, gastric polyps 2. Moderate non-erosive gastriits and mild duodenitis.  3.TWo large gstric polyps removed.    EYE SURGERY  12/22/2010   tear duct probing-[place]   FOREIGN BODY REMOVAL  03/29/2011   Procedure: REMOVAL FOREIGN BODY EXTREMITY;  Surgeon: Taft Minerva, MD;  Location: [facility] ORS;  Service: Orthopedics;  Laterality: Right;  Removal  Foreign Body Right Thumb   INSERTION OF DIALYSIS CATHETER N/A 11/13/2023   Procedure: INSERTION OF DIALYSIS CATHETER;  Surgeon: Sheree Penne Bruckner, MD;  Location: [facility] OR;  Service: Vascular;  Laterality: N/A;   IR FLUORO GUIDE CV LINE RIGHT  08/06/2018   IR FLUORO GUIDE CV LINE RIGHT  02/17/2023   IR PARACENTESIS  06/26/2024   IR PARACENTESIS  07/08/2024  IR US  GUIDE VASC ACCESS RIGHT  08/06/2018   IR US  GUIDE VASC ACCESS RIGHT  02/17/2023   KNEE ARTHROSCOPY  10/16/2007   left   KNEE ARTHROSCOPY WITH LATERAL MENISECTOMY Right 10/14/2015   Procedure: LEFT KNEE ARTHROSCOPY WITH PARTIAL LATERAL MENISECTOMY;  Surgeon: Taft FORBES Minerva, MD;  Location: [facility] ORS;  Service: Orthopedics;  Laterality: Right;   LEFT HEART CATH AND CORONARY ANGIOGRAPHY N/A 07/25/2021   Procedure: LEFT HEART CATH AND CORONARY ANGIOGRAPHY;  Surgeon: Wonda Sharper, MD;  Location: [facility] INVASIVE CV LAB;  Service: Cardiovascular;  Laterality: N/A;   LEFT HEART CATH AND CORONARY ANGIOGRAPHY N/A 12/26/2021   Procedure: LEFT HEART CATH AND CORONARY ANGIOGRAPHY;  Surgeon: Mady Bruckner, MD;  Location: [facility] INVASIVE CV LAB;  Service: Cardiovascular;  Laterality: N/A;   LEFT HEART CATH AND CORONARY ANGIOGRAPHY N/A 01/20/2022   Procedure: LEFT HEART CATH AND CORONARY ANGIOGRAPHY;  Surgeon: Wonda Sharper, MD;  Location: [facility] INVASIVE CV LAB;  Service: Cardiovascular;  Laterality: N/A;   LEFT HEART CATHETERIZATION WITH CORONARY ANGIOGRAM N/A 03/28/2013   Procedure: LEFT HEART CATHETERIZATION WITH CORONARY ANGIOGRAM;  Surgeon: Alm LELON Clay, MD;  Location: [facility] CATH LAB;  Service: Cardiovascular;  Laterality: N/A;   NM MYOVIEW  LTD     PENILE PROSTHESIS IMPLANT N/A 08/16/2015   Procedure: PENILE PROTHESIS INFLATABLE, three piece, Excisional biopsy of Penile ulcer, Penile molding;  Surgeon: Arlena Gal, MD;  Location: [facility] ORS;  Service: Urology;  Laterality: N/A;   PENILE PROSTHESIS IMPLANT N/A 12/24/2017   Procedure:  REMOVAL AND  REPLACEMENT  COLOPLAST PENILE PROSTHESIS;  Surgeon: Carolee Sherwood JONETTA DOUGLAS, MD;  Location: [facility] ORS;  Service: Urology;  Laterality: N/A;   PERIPHERAL VASCULAR BALLOON ANGIOPLASTY  02/28/2023   Procedure: PERIPHERAL VASCULAR BALLOON ANGIOPLASTY;  Surgeon: Lanis Fonda FORBES, MD;  Location: [facility];  Service: Cardiovascular;;   PORT-A-CATH REMOVAL N/A 02/19/2023   Procedure: REMOVAL OF PERITONEAL DIALYSIS CATHETER;  Surgeon: Lanis Fonda FORBES, MD;  Location: [facility] OR;  Service: Vascular;  Laterality: N/A;   QUADRICEPS TENDON REPAIR  07/21/2011   Procedure: REPAIR QUADRICEP TENDON;  Surgeon: Taft Minerva, MD;  Location: [facility];  Service: Orthopedics;  Laterality: Right;   REVISON OF ARTERIOVENOUS FISTULA Left 11/13/2023   Procedure: REVISON OF ARTERIOVENOUS FISTULA;  Surgeon: Sheree Penne Bruckner, MD;  Location: [facility];  Service: Vascular;  Laterality: Left;   RIGHT/LEFT HEART CATH AND CORONARY ANGIOGRAPHY N/A 05/30/2022   Procedure: RIGHT/LEFT HEART CATH AND CORONARY ANGIOGRAPHY;  Surgeon: Dann Candyce RAMAN, MD;  Location: [facility] INVASIVE CV LAB;  Service: Cardiovascular;  Laterality: N/A;   TEE WITHOUT CARDIOVERSION N/A 06/05/2022   Procedure: TRANSESOPHAGEAL ECHOCARDIOGRAM (TEE);  Surgeon: Shyrl Linnie KIDD, MD;  Location: [facility] OR;  Service: Open Heart Surgery;  Laterality: N/A;   TOENAIL EXCISION     removed x2-bilateral   UMBILICAL HERNIA REPAIR  07/17/2005   [place]    Allergies: Allergies[1]  Medications:   Prior to Admission medications  Medication Sig Start Date End Date Taking? Authorizing Provider  acetaminophen  (TYLENOL ) 650 MG CR tablet Take 1,300 mg by mouth every 8 (eight) hours as needed for pain.   Yes [provider]  albuterol  (VENTOLIN  HFA) 108 (90 Base) MCG/ACT inhaler Inhale 2 puffs into the lungs every 6 (six) hours as needed for wheezing or shortness of breath. 08/26/23  Yes Emokpae, Courage, MD  amLODipine  (NORVASC ) 5 MG tablet Take 1 tablet  (5 mg total) by mouth daily. 08/26/23  Yes Pearlean Manus, MD  ascorbic acid  (  VITAMIN C ) 500 MG tablet Take 1 tablet (500 mg total) by mouth daily. 05/07/23  Yes Setzer, Nena PARAS, PA-C  aspirin  EC 81 MG tablet Take 81 mg by mouth daily. Swallow whole.   Yes [provider]  atorvastatin  (LIPITOR ) 80 MG tablet Take 1 tablet (80 mg total) by mouth daily. 06/20/22  Yes Dwan Aldo M, PA-C  benzonatate  (TESSALON ) 100 MG capsule Take 100 mg by mouth daily. 04/28/24  Yes [provider]  calcitRIOL  (ROCALTROL ) 0.5 MCG capsule Take 0.5 mcg by mouth daily.   Yes [provider]  carvedilol  (COREG ) 6.25 MG tablet Take 6.25 mg by mouth 2 (two) times daily. 03/27/24  Yes [provider]  divalproex  (DEPAKOTE ) 250 MG DR tablet Take 250 mg by mouth 2 (two) times daily. 03/27/24  Yes [provider]  ferric citrate  (AURYXIA ) 1 GM 210 MG(Fe) tablet Take 420 mg by mouth 3 (three) times daily with meals.   Yes [provider]  loratadine  (CLARITIN ) 10 MG tablet Take 10 mg by mouth daily.   Yes [provider]  midodrine  (PROAMATINE ) 10 MG tablet Take 10 mg by mouth 3 (three) times a week. Pablo He[place], and Friday 12/26/23  Yes [provider]  Multiple Vitamins-Minerals (MENS MULTIVITAMIN PO) Take 1 tablet by mouth daily.   Yes [provider]  OVER THE COUNTER MEDICATION Take 2 tablets by mouth daily at 6 (six) AM.   Yes [provider]  tamsulosin  (FLOMAX ) 0.4 MG CAPS capsule Take 0.4 mg by mouth daily after supper. 09/17/23  Yes [provider]  TRELEGY ELLIPTA  200-62.5-25 MCG/ACT AEPB INHALE ONE PUFF INTO THE LUNGS ONCE DAILY 07/02/24  Yes Hope Almarie ORN, NP    Discontinued Meds:   Medications Discontinued During This Encounter  Medication Reason   esomeprazole  (NEXIUM ) 40 MG capsule Patient Preference    Social History:  reports that he quit smoking about 14 years ago. His smoking use included cigarettes.  He started smoking about 39 years ago. He has a 25 pack-year smoking history. He has never used smokeless tobacco. He reports current alcohol  use. He reports current drug use. Drug: Marijuana.  Family History:   Family History  Problem Relation Age of Onset   Hypertension Mother        MI   Cancer Mother        breast    Diabetes Mother    Diabetes Father    Hypertension Father    Hypertension Sister    Diabetes Sister    Arthritis Other    Asthma Other    Lung disease Other    Anesthesia problems Neg Hx    Hypotension Neg Hx    Malignant hyperthermia Neg Hx    Pseudochol deficiency Neg Hx    Colon cancer Neg Hx     Blood pressure 98/70, pulse (!) 52, temperature 98.1 F (36.7 C), temperature source Oral, resp. rate 20, height 5' 4 (1.626 m), weight 98.2 kg, SpO2 98%. General appearance: alert, cooperative, no distress  Head: Normocephalic, without obvious abnormality, atraumatic Resp: clear to auscultation bilaterally Cardio: regular rate and rhythm, S1, S2 normal, no murmur, click, rub or gallop GI: soft, non-tender; bowel sounds normal; no masses,  no organomegaly Extremities: trace extremities b/l LE Dialysis access: LUE AVF +T/B       MELIA LYNWOOD ORN, MD 07/10/2024, 9:36 AM      [1]  Allergies Allergen Reactions   Tramadol  Itching   Opana [Oxymorphone Hcl] Itching

## 2024-07-10 NOTE — Care Management Obs Status (Signed)
 MEDICARE OBSERVATION STATUS NOTIFICATION   Patient Details  Name: [name] [name] MRN: [id number] Date of Birth: [birth year]   Medicare Observation Status Notification Given:  Yes    Hoy DELENA Bigness, LCSW 07/10/2024, 10:09 AM

## 2024-07-11 DIAGNOSIS — R188 Other ascites: Secondary | ICD-10-CM | POA: Diagnosis not present

## 2024-07-11 DIAGNOSIS — K7460 Unspecified cirrhosis of liver: Secondary | ICD-10-CM

## 2024-07-11 DIAGNOSIS — K652 Spontaneous bacterial peritonitis: Secondary | ICD-10-CM | POA: Diagnosis not present

## 2024-07-11 LAB — COMPREHENSIVE METABOLIC PANEL WITH GFR
ALT: 21 U/L (ref 0–44)
AST: 17 U/L (ref 15–41)
Albumin: 4.4 g/dL (ref 3.5–5.0)
Alkaline Phosphatase: 81 U/L (ref 38–126)
Anion gap: 12 (ref 5–15)
BUN: 42 mg/dL — ABNORMAL HIGH (ref 8–23)
CO2: 30 mmol/L (ref 22–32)
Calcium: 10.1 mg/dL (ref 8.9–10.3)
Chloride: 98 mmol/L (ref 98–111)
Creatinine, Ser: 10.5 mg/dL — ABNORMAL HIGH (ref 0.61–1.24)
GFR, Estimated: 5 mL/min — ABNORMAL LOW
Glucose, Bld: 133 mg/dL — ABNORMAL HIGH (ref 70–99)
Potassium: 5.1 mmol/L (ref 3.5–5.1)
Sodium: 140 mmol/L (ref 135–145)
Total Bilirubin: 0.3 mg/dL (ref 0.0–1.2)
Total Protein: 7.1 g/dL (ref 6.5–8.1)

## 2024-07-11 LAB — CBC
HCT: 34.1 % — ABNORMAL LOW (ref 39.0–52.0)
Hemoglobin: 10.3 g/dL — ABNORMAL LOW (ref 13.0–17.0)
MCH: 27 pg (ref 26.0–34.0)
MCHC: 30.2 g/dL (ref 30.0–36.0)
MCV: 89.3 fL (ref 80.0–100.0)
Platelets: 151 K/uL (ref 150–400)
RBC: 3.82 MIL/uL — ABNORMAL LOW (ref 4.22–5.81)
RDW: 17.1 % — ABNORMAL HIGH (ref 11.5–15.5)
WBC: 4.5 K/uL (ref 4.0–10.5)
nRBC: 0 % (ref 0.0–0.2)

## 2024-07-11 LAB — MAGNESIUM: Magnesium: 2.4 mg/dL (ref 1.7–2.4)

## 2024-07-11 LAB — GLUCOSE, CAPILLARY: Glucose-Capillary: 95 mg/dL (ref 70–99)

## 2024-07-11 LAB — HEPATITIS B SURFACE ANTIBODY, QUANTITATIVE: Hep B S AB Quant (Post): 6.1 m[IU]/mL — ABNORMAL LOW

## 2024-07-11 LAB — PHOSPHORUS: Phosphorus: 6.4 mg/dL — ABNORMAL HIGH (ref 2.5–4.6)

## 2024-07-11 MED ORDER — ALBUTEROL SULFATE HFA 108 (90 BASE) MCG/ACT IN AERS: 2.0000 | INHALATION_SPRAY | Freq: Four times a day (QID) | RESPIRATORY_TRACT | 11 refills | Status: AC | PRN

## 2024-07-11 MED ORDER — TRELEGY ELLIPTA 200-62.5-25 MCG/ACT IN AEPB: 1.0000 | INHALATION_SPRAY | Freq: Every day | RESPIRATORY_TRACT | 5 refills | Status: AC

## 2024-07-11 MED ORDER — SODIUM CHLORIDE 0.9 % IV SOLN: 2.0000 g | INTRAVENOUS | Status: DC

## 2024-07-11 MED ORDER — ASPIRIN EC 81 MG PO TBEC: 81.0000 mg | DELAYED_RELEASE_TABLET | Freq: Every day | ORAL | 11 refills | Status: AC

## 2024-07-11 MED ORDER — HEPARIN SODIUM (PORCINE) 1000 UNIT/ML DIALYSIS
3000.0000 [IU] | Freq: Once | INTRAMUSCULAR | Status: AC
  Administered 2024-07-11: 3000 [IU] via INTRAVENOUS_CENTRAL

## 2024-07-11 MED ORDER — HEPARIN SODIUM (PORCINE) 1000 UNIT/ML IJ SOLN
INTRAMUSCULAR | Status: AC
  Filled 2024-07-11: qty 3

## 2024-07-11 MED ORDER — ACETAMINOPHEN 325 MG PO TABS
ORAL_TABLET | ORAL | Status: AC
  Filled 2024-07-11: qty 2

## 2024-07-11 MED ORDER — ACETAMINOPHEN 325 MG PO TABS: 650.0000 mg | ORAL_TABLET | Freq: Four times a day (QID) | ORAL | Status: AC | PRN

## 2024-07-11 MED ORDER — SODIUM CHLORIDE 0.9 % IV SOLN
2.0000 g | Freq: Once | INTRAVENOUS | Status: AC
  Administered 2024-07-11: 2 g via INTRAVENOUS
  Filled 2024-07-11 (×2): qty 12.5

## 2024-07-11 MED ORDER — ALBUMIN HUMAN 25 % IV SOLN
INTRAVENOUS | Status: AC
  Filled 2024-07-11: qty 50

## 2024-07-11 MED ORDER — MIDODRINE HCL 10 MG PO TABS: 10.0000 mg | ORAL_TABLET | ORAL | 3 refills | Status: AC

## 2024-07-11 NOTE — Progress Notes (Signed)
 Gastroenterology & Hepatology   Interval History: Patient is seen during dialysis.  Reports no active complaints and requesting to go home  Inpatient Medications: Current Medications[1]   I/O    Intake/Output Summary (Last 24 hours) at 07/11/2024 1458 Last data filed at 07/10/2024 2330 Gross per 24 hour  Intake 10 ml  Output --  Net 10 ml     Physical Exam: Temp:  [97.8 F (36.6 C)-98.6 F (37 C)] 97.8 F (36.6 C) (12/26 1053) Pulse Rate:  [33-60] 54 (12/26 1430) Resp:  [16-26] 24 (12/26 1430) BP: (99-126)/(49-90) 106/58 (12/26 1430) SpO2:  [92 %-100 %] 99 % (12/26 1430) Weight:  [99.3 kg] 99.3 kg (12/26 1053)  Temp (24hrs), Avg:98.2 F (36.8 C), Min:97.8 F (36.6 C), Max:98.6 F (37 C)  GENERAL: The patient is AO x3, in no acute distress. HEENT: Head is normocephalic and atraumatic. EOMI are intact. Mouth is well hydrated and without lesions. NECK: Supple. No masses LUNGS: Clear to auscultation. No presence of rhonchi/wheezing/rales. Adequate chest expansion HEART: RRR, normal s1 and s2. ABDOMEN: Soft, nontender, no guarding, no peritoneal signs, and nondistended. BS +. No masses.   Laboratory Data: CBC:     Component Value Date/Time   WBC 4.5 07/11/2024 0452   RBC 3.82 (L) 07/11/2024 0452   HGB 10.3 (L) 07/11/2024 0452   HGB 11.3 (L) 12/22/2021 1017   HCT 34.1 (L) 07/11/2024 0452   HCT 33.7 (L) 12/22/2021 1017   PLT 151 07/11/2024 0452   PLT 117 (L) 12/22/2021 1017   MCV 89.3 07/11/2024 0452   MCV 91 12/22/2021 1017   MCH 27.0 07/11/2024 0452   MCHC 30.2 07/11/2024 0452   RDW 17.1 (H) 07/11/2024 0452   RDW 13.7 12/22/2021 1017   LYMPHSABS 0.6 (L) 07/09/2024 1205   MONOABS 0.5 07/09/2024 1205   EOSABS 0.1 07/09/2024 1205   BASOSABS 0.0 07/09/2024 1205   COAG:  Lab Results  Component Value Date   INR 1.1 07/09/2024   INR 1.1 05/21/2024   INR 1.1 04/15/2024    BMP:     Latest Ref Rng & Units 07/11/2024    4:52 AM 07/10/2024    4:18 AM  07/09/2024   12:05 PM  BMP  Glucose 70 - 99 mg/dL 866  897  887   BUN 8 - 23 mg/dL 42  32  26   Creatinine 0.61 - 1.24 mg/dL 89.49  0.71  2.46   Sodium 135 - 145 mmol/L 140  139  138   Potassium 3.5 - 5.1 mmol/L 5.1  4.7  4.2   Chloride 98 - 111 mmol/L 98  98  98   CO2 22 - 32 mmol/L 30  32  25   Calcium  8.9 - 10.3 mg/dL 89.8  9.4  9.1     HEPATIC:     Latest Ref Rng & Units 07/11/2024    4:52 AM 07/10/2024    4:18 AM 07/09/2024   12:05 PM  Hepatic Function  Total Protein 6.5 - 8.1 g/dL 7.1  6.3  6.6   Albumin  3.5 - 5.0 g/dL 4.4  3.2  3.2   AST 15 - 41 U/L 17  21  25    ALT 0 - 44 U/L 21  24  27    Alk Phosphatase 38 - 126 U/L 81  92  96   Total Bilirubin 0.0 - 1.2 mg/dL 0.3  0.3  0.4     CARDIAC:  Lab Results  Component Value Date   CKTOTAL 335  02/15/2023   TROPONINI 0.04 (HH) 08/30/2018      Imaging: I personally reviewed and interpreted the available labs, imaging and endoscopic files.   Assessment/Plan:  This is a 73 y.o. male with history of history of GERD, multifactorial anemia with chronic disease/IDA, previously on peritoneal dialysis complicated by previous intra-abdominal infection from peritoneal dialysis catheter and now currently on HD, cirrhosis, gallbladder polyp, severe aortic stenosis, CAD s/p CABG, diabetes, COPD, HTN, HLD, OSA, and CHF, SBP diagnosed (06/26/2024) presented again with persistent neutrophils on ascitic fluid not improving with outpatient IV antibiotic.   #Decompensated cirrhosis with Ascites #culture-negative neutrocytic ascites   Repeat para with increased WBC count, PMN now greater than 1000. No growth thus far on culture. Patient has chronic left sided abdominal discomfort.  Patient has been getting cefepime  2 g with dialysis including had 1 dose of albumin  ; repeat paracentesis with persistent neutrophils   Patient currently is asymptomatic and received Albumin  (1.5g/kg on day 1 and than 1.0 g/kg on day 3, max 100gm) and antibiotics ,  requesting to go home   Recs:   -Broad-spectrum antibiotics; Appreciate ID input   -repeat paracentesis ; to send cell count , culture    -If persistent neutrophils on next paracentesis , would recommend cross-sectional imaging  -upon discharge would recommend indefinite antibiotic secondary prophylaxis    -continued care for decompensated cirrhosis as outpatient   Victoriah Wilds Faizan Kamala Kolton, MD Gastroenterology and Hepatology [facility] Gastroenterology   This chart has been completed using [facility] Dictation software, and while attempts have been made to ensure accuracy , certain words and phrases may not be transcribed as intended      [1]  Current Facility-Administered Medications:    acetaminophen  (TYLENOL ) tablet 650 mg, 650 mg, Oral, Q6H PRN, Maree Bracken D, DO, 650 mg at 07/11/24 1209   albumin  human 25 % solution 50 g, 50 g, Intravenous, Q6H, Shah, Pratik D, DO, Last Rate: 60 mL/hr at 07/11/24 1431, 50 g at 07/11/24 1431   albuterol  (PROVENTIL ) (2.5 MG/3ML) 0.083% nebulizer solution 3 mL, 3 mL, Nebulization, Q6H PRN, Maree, Pratik D, DO   ascorbic acid  (VITAMIN C ) tablet 500 mg, 500 mg, Oral, Daily, Maree, Pratik D, DO, 500 mg at 07/11/24 0830   aspirin  EC tablet 81 mg, 81 mg, Oral, Daily, Maree, Pratik D, DO, 81 mg at 07/11/24 0830   atorvastatin  (LIPITOR ) tablet 80 mg, 80 mg, Oral, Daily, Maree, Pratik D, DO, 80 mg at 07/11/24 9167   budesonide -glycopyrrolate -formoterol  (BREZTRI ) 160-9-4.8 MCG/ACT inhaler 2 puff, 2 puff, Inhalation, BID, Maree, Pratik D, DO, 2 puff at 07/11/24 0932   calcitRIOL  (ROCALTROL ) capsule 0.5 mcg, 0.5 mcg, Oral, Daily, Maree, Pratik D, DO, 0.5 mcg at 07/11/24 0832   ceFEPIme  (MAXIPIME ) 2 g in sodium chloride  0.9 % 100 mL IVPB, 2 g, Intravenous, Once, Emokpae, Courage, MD   cefTRIAXone  (ROCEPHIN ) 2 g in sodium chloride  0.9 % 100 mL IVPB, 2 g, Intravenous, Q24H, Shah, Pratik D, DO, Last Rate: 200 mL/hr at 07/11/24 0838, 2 g at 07/11/24 9161    Chlorhexidine  Gluconate Cloth 2 % PADS 6 each, 6 each, Topical, Daily, Maree Bracken D, DO, 6 each at 07/11/24 9056   Chlorhexidine  Gluconate Cloth 2 % PADS 6 each, 6 each, Topical, Q0600, Melia Lynwood ORN, MD, 6 each at 07/11/24 0556   divalproex  (DEPAKOTE ) DR tablet 250 mg, 250 mg, Oral, BID, Maree, Pratik D, DO, 250 mg at 07/10/24 9097   ferric citrate  (AURYXIA ) tablet 420 mg, 420  mg, Oral, TID WC, Shah, Pratik D, DO, 420 mg at 07/11/24 9056   heparin  injection 5,000 Units, 5,000 Units, Subcutaneous, Q8H, Maree Bracken D, DO, 5,000 Units at 07/10/24 2121   insulin  aspart (novoLOG ) injection 0-5 Units, 0-5 Units, Subcutaneous, QHS, Shah, Pratik D, DO   insulin  aspart (novoLOG ) injection 0-6 Units, 0-6 Units, Subcutaneous, TID WC, Shah, Pratik D, DO   loratadine  (CLARITIN ) tablet 10 mg, 10 mg, Oral, Daily, Maree, Pratik D, DO, 10 mg at 07/11/24 0830   midodrine  (PROAMATINE ) tablet 10 mg, 10 mg, Oral, Once per day on Monday Wednesday Friday, Maree, Pratik D, DO, 10 mg at 07/11/24 0830   ondansetron  (ZOFRAN ) tablet 4 mg, 4 mg, Oral, Q6H PRN **OR** ondansetron  (ZOFRAN ) injection 4 mg, 4 mg, Intravenous, Q6H PRN, Maree, Pratik D, DO   sodium chloride  flush (NS) 0.9 % injection 10-40 mL, 10-40 mL, Intracatheter, Q12H, Maree, Pratik D, DO, 10 mL at 07/11/24 9056   sodium chloride  flush (NS) 0.9 % injection 10-40 mL, 10-40 mL, Intracatheter, PRN, Maree, Pratik D, DO   tamsulosin  (FLOMAX ) capsule 0.4 mg, 0.4 mg, Oral, QPC supper, Maree, Pratik D, DO, 0.4 mg at 07/09/24 1718

## 2024-07-11 NOTE — Progress Notes (Signed)
 Pt arrived to unit, used L AVF for tx with no issues. Pt administered tylenol  650mg  for abd pain during tx. Pt completed tx with no issues.Pain subsided by end of tx. Pt transferred to unit with no s/s of distress.  07/11/24 1500  Vitals  Temp 97.8 F (36.6 C)  Pulse Rate (!) 48  Resp 19  BP 112/72  SpO2 100 %  O2 Device Room Air  Weight 95.9 kg  Post Treatment  Dialyzer Clearance Clear  Liters Processed 90  Fluid Removed (mL) 2600 mL  Tolerated HD Treatment Yes  AVG/AVF Arterial Site Held (minutes) 5 minutes  AVG/AVF Venous Site Held (minutes) 5 minutes

## 2024-07-11 NOTE — Discharge Summary (Signed)
 "                                                                                [name] [name], is a 73 y.o. male  DOB 1950/12/28  MRN [id number].  Admission date:  07/09/2024  Admitting Physician  Adron JONETTA Fairly, DO  Discharge Date:  07/11/2024   Primary MD  Carlette Benita Area, MD  Recommendations for primary care physician for things to follow:  1)Iv cefepime  2g with each dialysis on Mondays Wednesdays and Fridays for 2 weeks. End of treatment would be July 21, 2024 for spontaneous bacterial peritonitis 2) repeat paracentesis with peritoneal fluid cell count, Gram stain and culture on Monday, 07/14/2024 3)Very Low-salt diet advised---Less than 2 gm of Sodium per day advised----ok to use Mrs DASH salt substitute instead of Salt 4)Weigh yourself daily, call if you gain more than 3 pounds in 1 day or more than 5 pounds in 1 week as your hemodialysis schedule and duration may need to be adjusted 5)Limit your Fluid  intake to No more than 50 ounces (1.5 Liters) per day  Admission Diagnosis  SBP (spontaneous bacterial peritonitis) (HCC) [K65.2]  Discharge Diagnosis  SBP (spontaneous bacterial peritonitis) (HCC) [K65.2]    Principal Problem:   SBP (spontaneous bacterial peritonitis) (HCC) Active Problems:   Hyperlipidemia   Primary hypertension   GERD   OSA on CPAP   CAD (coronary artery disease)   End stage renal disease on dialysis (HCC)   Hypertension associated with diabetes (HCC)   Nonrheumatic aortic valve stenosis      Past Medical History:  Diagnosis Date   Anemia    Arthritis    Asthma    Bell palsy    left side of face- comes and goes per pt on 11/12/23   CAD (coronary artery disease)    a. 2014 MV: abnl w/ infap ischemia; b. 03/2013 Cath: aneurysmal bleb in the LAD w/ otw nonobs dzs-->Med Rx.   Chronic back pain    Chronic kidney disease    on dialysis - mon wed fri   Chronic knee pain    a. 09/2015 s/p R TKA.   Chronic pain    Chronic shoulder pain     Chronic sinusitis    COPD (chronic obstructive pulmonary disease) (HCC)    Diabetes mellitus without complication (HCC)    type II , no meds, does not check blood sugar   Dyspnea    with exertion   Dysrhythmia    2nd degree AV block   ESRD on peritoneal dialysis (HCC)    on peritoneal dialysis, [facility]  - mon wed fri   Essential hypertension    GERD (gastroesophageal reflux disease)    Gout    Hepatomegaly    noted on noncontrast CT 2015   History of hiatal hernia    Hyperlipidemia    Hypothyroidism    Lateral meniscus tear    Myocardial infarction (HCC) 07/2021   Neuromuscular disorder (HCC)    Obesity    Truncal   Obstructive sleep apnea    does not use cpap    On home oxygen  therapy    uses 2L when needed  Pneumonia    PUD (peptic ulcer disease)    remote, reports f/u EGD about 8 years ago unremarkable    Reactive airway disease    related to exposure to chemical during 9/11   Sinusitis    Substance abuse (HCC)    hx in past   Vitamin D  deficiency     Past Surgical History:  Procedure Laterality Date   A/V FISTULAGRAM N/A 02/28/2023   Procedure: A/V Fistulagram;  Surgeon: Lanis Fonda BRAVO, MD;  Location: [facility] INVASIVE CV LAB;  Service: Cardiovascular;  Laterality: N/A;   A/V FISTULAGRAM N/A 10/22/2023   Procedure: A/V Fistulagram;  Surgeon: Sheree Penne Bruckner, MD;  Location: [facility] INVASIVE CV LAB;  Service: Cardiovascular;  Laterality: N/A;   ASAD LT SHOULDER  12/15/2008   left shoulder   AV FISTULA PLACEMENT Left 08/09/2016   Procedure: BRACHIOCEPHALIC ARTERIOVENOUS (AV) FISTULA CREATION LEFT ARM;  Surgeon: Carlin BRAVO Haddock, MD;  Location: [facility] OR;  Service: Vascular;  Laterality: Left;   BASCILIC VEIN TRANSPOSITION Left 11/13/2023   Procedure: TRANSPOSITION, VEIN, BASILIC;  Surgeon: Sheree Penne Bruckner, MD;  Location: [facility] OR;  Service: Vascular;  Laterality: Left;   CAPD INSERTION N/A 10/07/2018   Procedure: LAPAROSCOPIC PERITONEAL CATHETER  PLACEMENT;  Surgeon: Stevie Herlene Righter, MD;  Location: [facility] ORS;  Service: General;  Laterality: N/A;   CATARACT EXTRACTION W/PHACO Left 03/28/2016   Procedure: CATARACT EXTRACTION PHACO AND INTRAOCULAR LENS PLACEMENT LEFT EYE;  Surgeon: Oneil Platts, MD;  Location: [facility] ORS;  Service: Ophthalmology;  Laterality: Left;  CDE: 4.77   CATARACT EXTRACTION W/PHACO Right 04/11/2016   Procedure: CATARACT EXTRACTION PHACO AND INTRAOCULAR LENS PLACEMENT RIGHT EYE; CDE:  4.74;  Surgeon: Oneil Platts, MD;  Location: [facility] ORS;  Service: Ophthalmology;  Laterality: Right;   COLONOSCOPY  10/15/2008   Fields: Rectal polyp obliterated, not retrieved, hemorrhoids, single ascending colon diverticulum near the CV. Next colonoscopy April 2020   COLONOSCOPY N/A 12/25/2014   SLF: 1. Colorectal polyps (2) removed 2. Small internal hemorrhoids 3. the left colon is severely redundant. hyperplastic polyps   CORONARY ARTERY BYPASS GRAFT N/A 06/05/2022   Procedure: OFF PUMP CORONARY ARTERY BYPASS GRAFTING (CABG) X 2 BYPASSES USING LEFT INTERNAL MAMMARY ARTERY AND RIGHT LEG GREATER SAPHENOUS VEIN HARVESTED ENDOSCOPICALLY;  Surgeon: Shyrl Linnie KIDD, MD;  Location: [facility] OR;  Service: Open Heart Surgery;  Laterality: N/A;   CORONARY STENT INTERVENTION N/A 07/25/2021   Procedure: CORONARY STENT INTERVENTION;  Surgeon: Wonda Sharper, MD;  Location: [facility] INVASIVE CV LAB;  Service: Cardiovascular;  Laterality: N/A;   CORONARY STENT INTERVENTION N/A 12/26/2021   Procedure: CORONARY STENT INTERVENTION;  Surgeon: Mady Bruckner, MD;  Location: [facility] INVASIVE CV LAB;  Service: Cardiovascular;  Laterality: N/A;   CORONARY STENT INTERVENTION N/A 01/20/2022   Procedure: CORONARY STENT INTERVENTION;  Surgeon: Wonda Sharper, MD;  Location: [facility] INVASIVE CV LAB;  Service: Cardiovascular;  Laterality: N/A;   DOPPLER ECHOCARDIOGRAPHY     ESOPHAGOGASTRODUODENOSCOPY N/A 12/25/2014   SLF: 1. Anemia most likely due to CRI, gastritis, gastric polyps  2. Moderate non-erosive gastriits and mild duodenitis.  3.TWo large gstric polyps removed.    EYE SURGERY  12/22/2010   tear duct probing-[place]   FOREIGN BODY REMOVAL  03/29/2011   Procedure: REMOVAL FOREIGN BODY EXTREMITY;  Surgeon: Taft Minerva, MD;  Location: [facility] ORS;  Service: Orthopedics;  Laterality: Right;  Removal Foreign Body Right Thumb   INSERTION OF DIALYSIS CATHETER N/A 11/13/2023   Procedure: INSERTION OF DIALYSIS CATHETER;  Surgeon: Sheree Penne Bruckner,  MD;  Location: [facility] OR;  Service: Vascular;  Laterality: N/A;   IR FLUORO GUIDE CV LINE RIGHT  08/06/2018   IR FLUORO GUIDE CV LINE RIGHT  02/17/2023   IR PARACENTESIS  06/26/2024   IR PARACENTESIS  07/08/2024   IR US  GUIDE VASC ACCESS RIGHT  08/06/2018   IR US  GUIDE VASC ACCESS RIGHT  02/17/2023   KNEE ARTHROSCOPY  10/16/2007   left   KNEE ARTHROSCOPY WITH LATERAL MENISECTOMY Right 10/14/2015   Procedure: LEFT KNEE ARTHROSCOPY WITH PARTIAL LATERAL MENISECTOMY;  Surgeon: Taft FORBES Minerva, MD;  Location: [facility]S;  Service: Orthopedics;  Laterality: Right;   LEFT HEART CATH AND CORONARY ANGIOGRAPHY N/A 07/25/2021   Procedure: LEFT HEART CATH AND CORONARY ANGIOGRAPHY;  Surgeon: Wonda Sharper, MD;  Location: [facility] INVASIVE CV LAB;  Service: Cardiovascular;  Laterality: N/A;   LEFT HEART CATH AND CORONARY ANGIOGRAPHY N/A 12/26/2021   Procedure: LEFT HEART CATH AND CORONARY ANGIOGRAPHY;  Surgeon: Mady Bruckner, MD;  Location: [facility] INVASIVE CV LAB;  Service: Cardiovascular;  Laterality: N/A;   LEFT HEART CATH AND CORONARY ANGIOGRAPHY N/A 01/20/2022   Procedure: LEFT HEART CATH AND CORONARY ANGIOGRAPHY;  Surgeon: Wonda Sharper, MD;  Location: [facility] INVASIVE CV LAB;  Service: Cardiovascular;  Laterality: N/A;   LEFT HEART CATHETERIZATION WITH CORONARY ANGIOGRAM N/A 03/28/2013   Procedure: LEFT HEART CATHETERIZATION WITH CORONARY ANGIOGRAM;  Surgeon: Alm LELON Clay, MD;  Location: [facility] CATH LAB;  Service: Cardiovascular;   Laterality: N/A;   NM MYOVIEW  LTD     PENILE PROSTHESIS IMPLANT N/A 08/16/2015   Procedure: PENILE PROTHESIS INFLATABLE, three piece, Excisional biopsy of Penile ulcer, Penile molding;  Surgeon: Arlena Gal, MD;  Location: [facility] ORS;  Service: Urology;  Laterality: N/A;   PENILE PROSTHESIS IMPLANT N/A 12/24/2017   Procedure: REMOVAL AND  REPLACEMENT  COLOPLAST PENILE PROSTHESIS;  Surgeon: Carolee Sherwood JONETTA DOUGLAS, MD;  Location: [facility] ORS;  Service: Urology;  Laterality: N/A;   PERIPHERAL VASCULAR BALLOON ANGIOPLASTY  02/28/2023   Procedure: PERIPHERAL VASCULAR BALLOON ANGIOPLASTY;  Surgeon: Lanis Fonda FORBES, MD;  Location: [facility] INVASIVE CV LAB;  Service: Cardiovascular;;   PORT-A-CATH REMOVAL N/A 02/19/2023   Procedure: REMOVAL OF PERITONEAL DIALYSIS CATHETER;  Surgeon: Lanis Fonda FORBES, MD;  Location: [facility] OR;  Service: Vascular;  Laterality: N/A;   QUADRICEPS TENDON REPAIR  07/21/2011   Procedure: REPAIR QUADRICEP TENDON;  Surgeon: Taft Minerva, MD;  Location: [facility]S;  Service: Orthopedics;  Laterality: Right;   REVISON OF ARTERIOVENOUS FISTULA Left 11/13/2023   Procedure: REVISON OF ARTERIOVENOUS FISTULA;  Surgeon: Sheree Penne Bruckner, MD;  Location: [facility] OR;  Service: Vascular;  Laterality: Left;   RIGHT/LEFT HEART CATH AND CORONARY ANGIOGRAPHY N/A 05/30/2022   Procedure: RIGHT/LEFT HEART CATH AND CORONARY ANGIOGRAPHY;  Surgeon: Dann Candyce RAMAN, MD;  Location: [facility] INVASIVE CV LAB;  Service: Cardiovascular;  Laterality: N/A;   TEE WITHOUT CARDIOVERSION N/A 06/05/2022   Procedure: TRANSESOPHAGEAL ECHOCARDIOGRAM (TEE);  Surgeon: Shyrl Linnie KIDD, MD;  Location: [facility] OR;  Service: Open Heart Surgery;  Laterality: N/A;   TOENAIL EXCISION     removed x2-bilateral   UMBILICAL HERNIA REPAIR  07/17/2005   [place]     HPI  from the history and physical done on the day of admission:   Chief Complaint: Abdominal pain   HPI: [name] [name] is a 73 y.o. male with medical history significant  for ESRD on HD MWF, type 2 diabetes, hypertension, COPD, CAD, ascites with recurrent paracentesis, and GERD who presented to the ED with  complaints of intermittent abdominal discomfort particularly to the left lower quadrant.  He states that he has been having intermittent pain since about 2 weeks prior to Thanksgiving, but denies any fevers or chills.  He states that he had paracentesis about 2 weeks ago and yesterday with 4 L removed.  He denies any nausea, vomiting, diarrhea, or other GI symptoms.  He states that he has been compliant with his hemodialysis and just finished a course of dialysis earlier this morning.  He states he has been compliant with his home medications as well.   ED Course: Vital signs stable and patient is afebrile.  Hemoglobin 10.7 with creatinine 7.53.  Chest x-ray with vascular congestion and interstitial edema noted.  Patient was started on albumin  and Rocephin  per GI recommendations and EDP is discussed with GI with further consultation pending.   Review of Systems: Reviewed as noted above, otherwise negative.   Hospital Course:   Assessment and Plan: 1)SBP--- Patient had 4 L paracentesis on 12/23 and findings are concerning for SBP (Repeat para with increased WBC count, PMN now greater than 1000) - No organisms on Gram stain and peritoneal fluid culture NGTD - Blood cultures from 07/09/2024 NGTD - Treated with IV Rocephin  and albumin  -GI and ID input appreciated -Patient with prior/recent history of SBP--prior to admission patient was treated with 2g cefepime  every other dialysis session at out-pt HD clinic. per clinic, this was started 07/07/24 -ID recommends IV cefepime  2 g on Mondays Wednesdays and Fridays with hemodialysis last dose 07/21/2024 -GI recommends repeat paracentesis around 07/14/2024 for cell count   2)ESRD currently on HD MWF - Continue HD per schedule and give IV cefepime  with HD each time through 07/21/2024 inclusive   HTN - Continue home  medications of carvedilol  and amlodipine    COPD - No bronchospasms, breathing treatments as needed   BPH - Continue tamsulosin    CAD with h/o of NSTEMI - Continue statin and beta-blocker   DM2 - Continue PTA diabetic regimen   Morbid Obesity, Class II with OSA -CPAP at bedtime -BMI 37.14  Discharge Condition: Stable,  Follow UP   Follow-up Information     Carlette Benita Area, MD. Schedule an appointment as soon as possible for a visit in 1 week(s).   Specialty: Internal Medicine Contact information: [address] [place] [address] [phone]         Cinderella Deatrice FALCON, MD. Schedule an appointment as soon as possible for a visit in 1 week(s).   Specialty: Gastroenterology Contact information: [address] [place] [address] [phone]                  Consults obtained -GI and infectious disease  Diet and Activity recommendation:  As advised  Discharge Instructions    Discharge Instructions     Call MD for:  difficulty breathing, headache or visual disturbances   Complete by: As directed    Call MD for:  persistant dizziness or light-headedness   Complete by: As directed    Call MD for:  severe uncontrolled pain   Complete by: As directed    Call MD for:  temperature >100.4   Complete by: As directed    Diet renal with fluid restriction   Complete by: As directed    Discharge instructions   Complete by: As directed    1)Iv cefepime  2g with each dialysis on Mondays Wednesdays and Fridays for 2 weeks. End of treatment would be July 21, 2024  for spontaneous bacterial peritonitis 2) repeat paracentesis with peritoneal fluid cell count, Gram stain and culture on Monday, 07/14/2024 3)Very Low-salt diet advised---Less than 2 gm of Sodium per day advised----ok to use Mrs DASH salt substitute instead of Salt 4)Weigh yourself daily, call if you gain more than 3 pounds in 1 day or more than 5 pounds in 1 week as your  hemodialysis schedule and duration may need to be adjusted 5)Limit your Fluid  intake to No more than 50 ounces (1.5 Liters) per day   Increase activity slowly   Complete by: As directed        Discharge Medications     Allergies as of 07/11/2024       Reactions   Tramadol  Itching   Opana [oxymorphone Hcl] Itching        Medication List     STOP taking these medications    acetaminophen  650 MG CR tablet Commonly known as: TYLENOL  Replaced by: acetaminophen  325 MG tablet       TAKE these medications    acetaminophen  325 MG tablet Commonly known as: TYLENOL  Take 2 tablets (650 mg total) by mouth every 6 (six) hours as needed for mild pain (pain score 1-3), moderate pain (pain score 4-6), fever or headache. Replaces: acetaminophen  650 MG CR tablet   albuterol  108 (90 Base) MCG/ACT inhaler Commonly known as: VENTOLIN  HFA Inhale 2 puffs into the lungs every 6 (six) hours as needed for wheezing or shortness of breath.   amLODipine  5 MG tablet Commonly known as: NORVASC  Take 1 tablet (5 mg total) by mouth daily.   ascorbic acid  500 MG tablet Commonly known as: VITAMIN C  Take 1 tablet (500 mg total) by mouth daily.   aspirin  EC 81 MG tablet Take 1 tablet (81 mg total) by mouth daily with breakfast. Swallow whole. What changed: when to take this   atorvastatin  80 MG tablet Commonly known as: LIPITOR  Take 1 tablet (80 mg total) by mouth daily.   Auryxia  1 GM 210 MG(Fe) tablet Generic drug: ferric citrate  Take 420 mg by mouth 3 (three) times daily with meals.   benzonatate  100 MG capsule Commonly known as: TESSALON  Take 100 mg by mouth daily.   calcitRIOL  0.5 MCG capsule Commonly known as: ROCALTROL  Take 0.5 mcg by mouth daily.   carvedilol  6.25 MG tablet Commonly known as: COREG  Take 6.25 mg by mouth 2 (two) times daily.   ceFEPIme  2 g in sodium chloride  0.9 % 100 mL Inject 2 g into the vein every Monday, Wednesday, and Friday. cefepime  2g with each  dialysis MWF--for 2 weeks. End of treatment would be July 21, 2024   divalproex  250 MG DR tablet Commonly known as: DEPAKOTE  Take 250 mg by mouth 2 (two) times daily.   loratadine  10 MG tablet Commonly known as: CLARITIN  Take 10 mg by mouth daily.   MENS MULTIVITAMIN PO Take 1 tablet by mouth daily.   midodrine  10 MG tablet Commonly known as: PROAMATINE  Take 1 tablet (10 mg total) by mouth every Monday, Wednesday, and Friday. Mon, Wed, and Friday---with Hemodialysis What changed:  when to take this additional instructions   OVER THE COUNTER MEDICATION Take 2 tablets by mouth daily at 6 (six) AM.   tamsulosin  0.4 MG Caps capsule Commonly known as: FLOMAX  Take 0.4 mg by mouth daily after supper.   Trelegy Ellipta  200-62.5-25 MCG/ACT Aepb Generic drug: Fluticasone -Umeclidin-Vilant Inhale 1 puff into the lungs daily at 12 noon. What changed: See the new instructions.  Major procedures and Radiology Reports - PLEASE review detailed and final reports for all details, in brief -   DG Chest Port 1 View Result Date: 07/09/2024 CLINICAL DATA:  Abdominal pain.  Questionable sepsis. EXAM: PORTABLE CHEST 1 VIEW COMPARISON:  05/21/2024 FINDINGS: Low lung volumes. The cardio pericardial silhouette is enlarged. There is pulmonary vascular congestion without overt pulmonary edema. Diffuse interstitial opacity raises the question of edema. Streaky density in the lung bases compatible with atelectasis. No substantial effusion. Gaseous dilatation of bowel identified in of the left hemidiaphragm. IMPRESSION: 1. Low volume film with vascular congestion and diffuse interstitial opacity suggesting edema. 2. Gaseous dilatation of bowel in the left hemidiaphragm. Electronically Signed   By: Camellia Candle M.D.   On: 07/09/2024 12:56   IR Paracentesis Result Date: 07/08/2024 INDICATION: 73 year old male with ESRD, cirrhosis, and recurrent ascites. Request for therapeutic and diagnostic  paracentesis up to 4 L. EXAM: ULTRASOUND GUIDED  PARACENTESIS MEDICATIONS: 10 mL 1% lidocaine  COMPLICATIONS: None immediate. PROCEDURE: Informed written consent was obtained from the patient after a discussion of the risks, benefits and alternatives to treatment. A timeout was performed prior to the initiation of the procedure. Initial ultrasound scanning demonstrates a large amount of ascites within the right lower abdominal quadrant. The right lower abdomen was prepped and draped in the usual sterile fashion. 1% lidocaine  was used for local anesthesia. Following this, a catheter was introduced. An ultrasound image was saved for documentation purposes. The paracentesis was performed. The catheter was removed and a dressing was applied. The patient tolerated the procedure well without immediate post procedural complication. FINDINGS: A total of approximately 4 L of hazy yellow fluid was removed. Samples were sent to the laboratory as requested by the clinical team. IMPRESSION: Successful ultrasound-guided paracentesis yielding 4 liters of peritoneal fluid. PLAN: The patient has required >/=2 paracenteses in a 30 day period and a formal evaluation by the [facility] Interventional Radiology Portal Hypertension Clinic has been arranged. Performed by: Aimee Han, PA-C. Electronically Signed   By: JONETTA Faes M.D.   On: 07/08/2024 17:06   US  Abdomen Complete Result Date: 07/03/2024 CLINICAL DATA:  Abdominal distension/cirrhosis. EXAM: ABDOMEN ULTRASOUND COMPLETE COMPARISON:  Ultrasound 04/15/2024 FINDINGS: Gallbladder: No gallstones or wall thickening visualized. No sonographic Murphy sign noted by sonographer. Common bile duct: Diameter: 4 mm. Liver: Nodular contour to the liver likely due to cirrhosis. No focal liver mass. Portal vein is patent on color Doppler imaging with normal direction of blood flow towards the liver. Moderate associated ascites. IVC: No abnormality visualized. Pancreas: Visualized portion  unremarkable. Spleen: Size and appearance within normal limits. Right Kidney: Length: 6.7 cm. Increased cortical echogenicity. No mass or hydronephrosis visualized. Left Kidney: Length: 9.0 cm. Echogenicity within normal limits. No mass or hydronephrosis visualized. Two small cysts with the larger measuring 2.3 cm. Abdominal aorta: No aneurysm visualized. Other findings: Ascites. IMPRESSION: 1. No acute findings. 2. Cirrhotic liver with moderate associated ascites. 3. Atrophic right kidney with increased cortical echogenicity likely due to chronic medical renal disease. 4. Two small left renal cysts with the larger measuring 2.3 cm. Electronically Signed   By: Toribio Agreste M.D.   On: 07/03/2024 10:07   IR Paracentesis Result Date: 06/26/2024 INDICATION: new onset ascites 73 year old male with history of GERD, or hemodialysis, previously on peritoneal dialysis, cirrhosis, now with ascites. IR is requested for diagnostic and therapeutic paracentesis. 4 L maximum. Albumin  requested. EXAM: ULTRASOUND GUIDED DIAGNOSTIC AND THERAPEUTIC PARACENTESIS MEDICATIONS: 5 cc of 1% lidocaine . COMPLICATIONS: None immediate.  PROCEDURE: Informed written consent was obtained from the patient after a discussion of the risks, benefits and alternatives to treatment. A timeout was performed prior to the initiation of the procedure. Initial ultrasound scanning demonstrates a large amount of ascites within the right lower abdominal quadrant. The right lower abdomen was prepped and draped in the usual sterile fashion. 1% lidocaine  was used for local anesthesia. Following this, a 19 gauge, 7-cm, Yueh catheter was introduced. An ultrasound image was saved for documentation purposes. The paracentesis was performed. The catheter was removed and a dressing was applied. The patient tolerated the procedure well without immediate post procedural complication. Patient was educated on and offered post-procedure intravenous albumin , as per order  request. Patient did refuse albumin  at this time. See nursing notes in Epic for details. FINDINGS: A total of approximately 4.0 L of clear, straw-colored is peritoneal fluid was removed. Samples were sent to the laboratory as requested by the clinical team. IMPRESSION: Successful ultrasound-guided paracentesis yielding 4.0 L of peritoneal fluid. Performed by: Carlin Griffon, PA-C PLAN: If the patient eventually requires >/=2 paracenteses in a 30 day period, candidacy for formal evaluation by the [facility] Interventional Radiology Portal Hypertension Clinic will be assessed. Thom Hall, MD [facility] Radiology Electronically Signed   By: Thom Hall M.D.   On: 06/26/2024 12:32   Micro Results  Recent Results (from the past 240 hours)  Culture, body fluid w Gram Stain-bottle     Status: None (Preliminary result)   Collection Time: 07/08/24  1:37 PM   Specimen: Peritoneal Washings  Result Value Ref Range Status   Specimen Description PERITONEAL  Final   Special Requests BOTTLES DRAWN AEROBIC AND ANAEROBIC  Final   Culture   Final    NO GROWTH 3 DAYS Performed at [facility], [address] [place], [place] [address]    Report Status PENDING  Incomplete  Gram stain     Status: None   Collection Time: 07/08/24  1:37 PM   Specimen: Peritoneal Washings  Result Value Ref Range Status   Specimen Description PERITONEAL  Final   Special Requests NONE  Final   Gram Stain   Final    WBC PRESENT, PREDOMINANTLY PMN NO ORGANISMS SEEN CYTOSPIN SMEAR Performed at [facility], [address] [place], [place] [address]    Report Status 07/08/2024 FINAL  Final  Blood Culture (routine x 2)     Status: None (Preliminary result)   Collection Time: 07/09/24 12:05 PM   Specimen: BLOOD  Result Value Ref Range Status   Specimen Description BLOOD RIGHT ASSIST CONTROL  Final   Special Requests   Final    BOTTLES DRAWN AEROBIC AND ANAEROBIC Blood  Culture adequate volume   Culture   Final    NO GROWTH 2 DAYS Performed at [facility], [address], [place], [place] [address]    Report Status PENDING  Incomplete  Blood Culture (routine x 2)     Status: None (Preliminary result)   Collection Time: 07/09/24 12:24 PM   Specimen: BLOOD  Result Value Ref Range Status   Specimen Description BLOOD RIGHT ANTECUBITAL  Final   Special Requests   Final    BOTTLES DRAWN AEROBIC ONLY Blood Culture adequate volume   Culture   Final    NO GROWTH 2 DAYS Performed at [facility], [address] [place], [place] [address]    Report Status PENDING  Incomplete   Today   Subjective  Norleen Pesa today has no new complaints - No fever  Or chills  No Nausea, Vomiting or Diarrhea  -- Eating and drinking well -Tolerated hemodialysis well on 07/12/2023    Patient has been seen and examined prior to discharge   Objective   Blood pressure 112/72, pulse (!) 48, temperature 97.8 F (36.6 C), resp. rate 19, height 5' 4 (1.626 m), weight 95.9 kg, SpO2 100%.   Intake/Output Summary (Last 24 hours) at 07/11/2024 1625 Last data filed at 07/11/2024 1500 Gross per 24 hour  Intake 10 ml  Output 2600 ml  Net -2590 ml    Exam Gen:- Awake Alert, no acute distress  HEENT:- [place].AT, No sclera icterus Neck-Supple Neck,No JVD,.  Lungs-  CTAB , good air movement bilaterally CV- S1, S2 normal, regular Abd-  +ve B.Sounds, Abd Soft, improved abdominal discomfort on palpation Extremity/Skin:- No  edema,   good pulses Psych-affect is appropriate, oriented x3 Neuro-no new focal deficits, no tremors  MSK--Dialysis access: LUE AVF +T/B    Data Review   CBC w Diff:  Lab Results  Component Value Date   WBC 4.5 07/11/2024   HGB 10.3 (L) 07/11/2024   HGB 11.3 (L) 12/22/2021   HCT 34.1 (L) 07/11/2024   HCT 33.7 (L) 12/22/2021   PLT 151 07/11/2024   PLT 117 (L) 12/22/2021   LYMPHOPCT 13 07/09/2024   BANDSPCT 5 02/15/2023   MONOPCT 11  07/09/2024   EOSPCT 2 07/09/2024   BASOPCT 1 07/09/2024   CMP:  Lab Results  Component Value Date   NA 140 07/11/2024   NA 144 05/16/2023   K 5.1 07/11/2024   CL 98 07/11/2024   CO2 30 07/11/2024   BUN 42 (H) 07/11/2024   BUN 40 (H) 05/16/2023   CREATININE 10.50 (H) 07/11/2024   CREATININE 18.89 (H) 02/09/2020   PROT 7.1 07/11/2024   PROT 6.5 05/16/2023   ALBUMIN  4.4 07/11/2024   ALBUMIN  3.7 (L) 05/16/2023   BILITOT 0.3 07/11/2024   BILITOT 0.6 05/16/2023   ALKPHOS 81 07/11/2024   AST 17 07/11/2024   ALT 21 07/11/2024   Total Discharge time is about 33 minutes  Rendall Carwin M.D on 07/11/2024 at 4:25 PM  Go to [email or web address] -  for contact info  [facility] - Office  [phone]   "

## 2024-07-11 NOTE — Discharge Instructions (Signed)
 1)Iv cefepime  2g with each dialysis on Mondays Wednesdays and Fridays for 2 weeks. End of treatment would be July 21, 2024 for spontaneous bacterial peritonitis 2) repeat paracentesis with peritoneal fluid cell count, Gram stain and culture on Monday, 07/14/2024 3)Very Low-salt diet advised---Less than 2 gm of Sodium per day advised----ok to use Mrs DASH salt substitute instead of Salt 4)Weigh yourself daily, call if you gain more than 3 pounds in 1 day or more than 5 pounds in 1 week as your hemodialysis schedule and duration may need to be adjusted 5)Limit your Fluid  intake to No more than 50 ounces (1.5 Liters) per day

## 2024-07-11 NOTE — Progress Notes (Addendum)
 Pt receives out-pt HD at [facility], MWF, [phone] chair time. Will continue to assist as needed.    Lavanda Gearold Wainer Dialysis Navigator [phone]   Addendum 1:40 pm D/c orders noted. Navigator was contacted by care team including pharmacy regarding need for iv abx at out-pt HD clinic. Navigator contacted [facility] who stated pt already of 2g cefepime  every other session. Care team informed. Now, care team would like to up this dose to 2g cefepime  at every HD session (MWF) for 2 weeks. Ending Jan 5th. Contacted [facility] and informed of this. They stated they will have available.   Will fax d/c summary and last nephrology note when available. Have let attending know d/c summ needs to be sent over today. Will continue to assist.

## 2024-07-11 NOTE — Progress Notes (Signed)
 Nephrology Follow-Up Consult note   Assessment/Recommendations: [name] [name] is a/an 73 y.o. male with a past medical history significant for DM2, HTN, CAD, COPD, cirrhosis, obesity, OSA, HLD, ESRD, admitted for acute hypoxic respiratory failure.      Dialysis Orders: Center: [facility]  on MWF. EDW 97.5kg HD Bath 2K/2.5 Ca  Time 3:45 Heparin  1000 units bolus then 500 units/hr. Access LUE AVF BFR 400 DFR 600    Calcitriol  2 mcg MWF Sensipar 30 mg MWF   Assessment/Plan:  Acute hypoxic respiratory failure -continue with ultrafiltration on dialysis as tolerated.  Will need to be challenged in the outpatient setting  ESRD -  Plan for HD to keep on MWF schedule  Hypertension/volume  -ultrafiltration with dialysis.  Continue home blood pressure medications  Anemia  -hemoglobin at goal without ESA  Metabolic bone disease -   continue home binders and home medications  Nutrition - renal diet, carb modified.   Recommendations conveyed to primary service.    [facility] 07/11/2024 12:07 PM  ___________________________________________________________  CC: Hypoxic respiratory failure  Interval History/Subjective: Patient feels well with no complaints   Medications:  Current Facility-Administered Medications  Medication Dose Route Frequency Provider Last Rate Last Admin   acetaminophen  (TYLENOL ) tablet 650 mg  650 mg Oral Q6H PRN Shah, Pratik D, DO   650 mg at 07/10/24 0417   albumin  human 25 % solution 50 g  50 g Intravenous Q6H Shah, Pratik D, DO 60 mL/hr at 07/11/24 0942 50 g at 07/11/24 9057   albuterol  (PROVENTIL ) (2.5 MG/3ML) 0.083% nebulizer solution 3 mL  3 mL Nebulization Q6H PRN Maree, Pratik D, DO       ascorbic acid  (VITAMIN C ) tablet 500 mg  500 mg Oral Daily Maree, Pratik D, DO   500 mg at 07/11/24 0830   aspirin  EC tablet 81 mg  81 mg Oral Daily Shah, Pratik D, DO   81 mg at 07/11/24 0830   atorvastatin  (LIPITOR ) tablet 80 mg  80 mg  Oral Daily Shah, Pratik D, DO   80 mg at 07/11/24 9167   budesonide -glycopyrrolate -formoterol  (BREZTRI ) 160-9-4.8 MCG/ACT inhaler 2 puff  2 puff Inhalation BID Maree, Pratik D, DO   2 puff at 07/11/24 0932   calcitRIOL  (ROCALTROL ) capsule 0.5 mcg  0.5 mcg Oral Daily Maree, Pratik D, DO   0.5 mcg at 07/11/24 9167   cefTRIAXone  (ROCEPHIN ) 2 g in sodium chloride  0.9 % 100 mL IVPB  2 g Intravenous Q24H Maree, Pratik D, DO 200 mL/hr at 07/11/24 0838 2 g at 07/11/24 9161   Chlorhexidine  Gluconate Cloth 2 % PADS 6 each  6 each Topical Daily Shah, Pratik D, DO   6 each at 07/11/24 9056   Chlorhexidine  Gluconate Cloth 2 % PADS 6 each  6 each Topical Q0600 Melia Lynwood LELON, MD   6 each at 07/11/24 0556   divalproex  (DEPAKOTE ) DR tablet 250 mg  250 mg Oral BID Shah, Pratik D, DO   250 mg at 07/10/24 0902   ferric citrate  (AURYXIA ) tablet 420 mg  420 mg Oral TID WC Shah, Pratik D, DO   420 mg at 07/11/24 9056   heparin  injection 5,000 Units  5,000 Units Subcutaneous Q8H Shah, Pratik D, DO   5,000 Units at 07/10/24 2121   insulin  aspart (novoLOG ) injection 0-5 Units  0-5 Units Subcutaneous QHS Maree, Pratik D, DO       insulin  aspart (novoLOG ) injection 0-6 Units  0-6 Units Subcutaneous TID WC  Maree, Pratik D, DO       loratadine  (CLARITIN ) tablet 10 mg  10 mg Oral Daily Maree, Pratik D, DO   10 mg at 07/11/24 0830   midodrine  (PROAMATINE ) tablet 10 mg  10 mg Oral Once per day on Monday Wednesday Friday Maree, Pratik D, DO   10 mg at 07/11/24 0830   ondansetron  (ZOFRAN ) tablet 4 mg  4 mg Oral Q6H PRN Maree, Pratik D, DO       Or   ondansetron  (ZOFRAN ) injection 4 mg  4 mg Intravenous Q6H PRN Maree, Pratik D, DO       sodium chloride  flush (NS) 0.9 % injection 10-40 mL  10-40 mL Intracatheter Q12H Shah, Pratik D, DO   10 mL at 07/11/24 9056   sodium chloride  flush (NS) 0.9 % injection 10-40 mL  10-40 mL Intracatheter PRN Maree, Pratik D, DO       tamsulosin  (FLOMAX ) capsule 0.4 mg  0.4 mg Oral QPC supper Shah, Pratik D, DO    0.4 mg at 07/09/24 1718      Review of Systems: 10 systems reviewed and negative except per interval history/subjective  Physical Exam: Vitals:   07/11/24 1145 07/11/24 1200  BP: 126/64 114/75  Pulse: (!) 52 (!) 52  Resp: 20 (!) 22  Temp:    SpO2: 93% 97%   No intake/output data recorded.  Intake/Output Summary (Last 24 hours) at 07/11/2024 1207 Last data filed at 07/10/2024 2330 Gross per 24 hour  Intake 10 ml  Output --  Net 10 ml   Constitutional: well-appearing, no acute distress ENMT: ears and nose without scars or lesions, MMM CV: normal rate, 1+ pitting edema in the bilateral lower extremities Respiratory: Bilateral chest rise, normal work of breathing Gastrointestinal: soft, non-tender, no palpable masses or hernias Skin: no visible lesions or rashes Psych: alert, judgement/insight appropriate, appropriate mood and affect   Test Results I personally reviewed new and old clinical labs and radiology tests Lab Results  Component Value Date   NA 140 07/11/2024   K 5.1 07/11/2024   CL 98 07/11/2024   CO2 30 07/11/2024   BUN 42 (H) 07/11/2024   CREATININE 10.50 (H) 07/11/2024   CALCIUM  10.1 07/11/2024   ALBUMIN  4.4 07/11/2024   PHOS 6.4 (H) 07/11/2024    CBC Recent Labs  Lab 07/09/24 1205 07/10/24 0418 07/11/24 0452  WBC 4.6 4.2 4.5  NEUTROABS 3.3  --   --   HGB 10.7* 10.6* 10.3*  HCT 34.8* 34.4* 34.1*  MCV 88.3 89.4 89.3  PLT 188 170 151

## 2024-07-11 NOTE — Plan of Care (Signed)
" °  Problem: Coping: Goal: Level of anxiety will decrease Outcome: Progressing   Problem: Pain Managment: Goal: General experience of comfort will improve and/or be controlled Outcome: Progressing   Problem: Safety: Goal: Ability to remain free from injury will improve Outcome: Progressing   Problem: Skin Integrity: Goal: Risk for impaired skin integrity will decrease Outcome: Progressing   Problem: Skin Integrity: Goal: Risk for impaired skin integrity will decrease Outcome: Progressing   Problem: Tissue Perfusion: Goal: Adequacy of tissue perfusion will improve Outcome: Progressing   "

## 2024-07-13 LAB — CULTURE, BODY FLUID W GRAM STAIN -BOTTLE: Culture: NO GROWTH

## 2024-07-14 ENCOUNTER — Telehealth: Payer: Self-pay

## 2024-07-14 LAB — CULTURE, BLOOD (ROUTINE X 2)
Culture: NO GROWTH
Culture: NO GROWTH
Special Requests: ADEQUATE
Special Requests: ADEQUATE

## 2024-07-14 NOTE — Transitions of Care (Post Inpatient/ED Visit) (Signed)
" ° °  07/14/2024  Name: [name] [name] MRN: [id number] DOB: 04-14-1951  Today's TOC FU Call Status: Today's TOC FU Call Status:: Unsuccessful Call (1st Attempt) Unsuccessful Call (1st Attempt) Date: 07/14/24  Attempted to reach the patient regarding the most recent Inpatient/ED visit.  Follow Up Plan: Additional outreach attempts will be made to reach the patient to complete the Transitions of Care (Post Inpatient/ED visit) call.   Markiya Keefe J. Spencer Cardinal RN, MSN [place]  [facility], [facility] RN Care Manager Direct Dial : [phone]  Fax: [phone] Website: [email or web address]   "

## 2024-07-14 NOTE — Progress Notes (Signed)
 Late note entry 12/29 0854am  D/c summ and last nepoh note faxed to [facility] at this time.   Lavanda Jolly Bleicher Dialysis Navigator [phone]

## 2024-07-15 ENCOUNTER — Telehealth: Payer: Self-pay | Admitting: *Deleted

## 2024-07-15 ENCOUNTER — Telehealth: Payer: Self-pay

## 2024-07-15 ENCOUNTER — Other Ambulatory Visit: Payer: Self-pay | Admitting: *Deleted

## 2024-07-15 DIAGNOSIS — K7460 Unspecified cirrhosis of liver: Secondary | ICD-10-CM

## 2024-07-15 NOTE — Patient Instructions (Signed)
 Visit Information  Thank you for taking time to visit with me today. Please don't hesitate to contact me if I can be of assistance to you.  Advised to contact physician if:   You have a new or higher fever.     Increased sweling and pain to abdomen   You do not get better as expected.   The patient verbalized understanding of instructions, educational materials, and care plan provided today and DECLINED offer to receive copy of patient instructions, educational materials, and care plan.   The patient has been provided with contact information for the care management team and has been advised to call with any health related questions or concerns.   Please call the care guide team at [phone] if you need to cancel or reschedule your appointment.   Please call the Suicide and Crisis Lifeline: 988 if you are experiencing a Mental Health or Behavioral Health Crisis or need someone to talk to.  Jerrick Farve J. Rock Sobol RN, MSN [place]  [facility], [facility] RN Care Manager Direct Dial : [phone]  Fax: [phone] Website: [email or web address]

## 2024-07-15 NOTE — Addendum Note (Signed)
 Addended by: GAYLENE MADELIN CROME on: 07/15/2024 08:38 AM   Modules accepted: Orders

## 2024-07-15 NOTE — Transitions of Care (Post Inpatient/ED Visit) (Signed)
 "  07/15/2024  Name: [name] [name] MRN: [id number] DOB: [birth year]  Today's TOC FU Call Status: Today's TOC FU Call Status:: Successful TOC FU Call Completed TOC FU Call Complete Date: 07/15/24  Patient's Name and Date of Birth confirmed. Name, DOB  Transition Care Management Follow-up Telephone Call Date of Discharge: 07/11/24 [facility] Type of Discharge: Inpatient Admission Primary Inpatient Discharge Diagnosis:: spontaneous bacterial peritonitis How have you been since you were released from the hospital?: Better Any questions or concerns?: No  Items Reviewed: Did you receive and understand the discharge instructions provided?: Yes Medications obtained,verified, and reconciled?: Yes (Medications Reviewed) Any new allergies since your discharge?: No Dietary orders reviewed?: Yes Type of Diet Ordered:: Renal diet Do you have support at home?: Yes People in Home [RPT]: spouse Name of Support/Comfort Primary Source: Mary  Medications Reviewed Today: Medications Reviewed Today     Reviewed by Mitsuru Dault, RN (Case Manager) on 07/15/24 at 1130  Med List Status: <None>   Medication Order Taking? Sig Documenting Provider Last Dose Status Informant  acetaminophen  (TYLENOL ) 325 MG tablet [id number] Yes Take 2 tablets (650 mg total) by mouth every 6 (six) hours as needed for mild pain (pain score 1-3), moderate pain (pain score 4-6), fever or headache. Pearlean Manus, MD  Active   albuterol  (VENTOLIN  HFA) 108 (90 Base) MCG/ACT inhaler [id number] Yes Inhale 2 puffs into the lungs every 6 (six) hours as needed for wheezing or shortness of breath. Pearlean Manus, MD  Active   amLODipine  (NORVASC ) 5 MG tablet [id number] Yes Take 1 tablet (5 mg total) by mouth daily. Pearlean Manus, MD  Active Self, Spouse/Significant Other, Pharmacy Records  ascorbic acid  (VITAMIN C ) 500 MG tablet [id number] Yes Take 1 tablet (500 mg total) by mouth daily. Setzer, Nena PARAS,  PA-C  Active Self, Spouse/Significant Other, Pharmacy Records  aspirin  EC 81 MG tablet [id number] Yes Take 1 tablet (81 mg total) by mouth daily with breakfast. Swallow whole. Pearlean Manus, MD  Active   atorvastatin  (LIPITOR ) 80 MG tablet [id number] Yes Take 1 tablet (80 mg total) by mouth daily. Dwan Kyla HERO, PA-C  Active Self, Spouse/Significant Other, Pharmacy Records  benzonatate  (TESSALON ) 100 MG capsule [id number] Yes Take 100 mg by mouth daily. [provider]  Active Self, Spouse/Significant Other, Pharmacy Records  calcitRIOL  (ROCALTROL ) 0.5 MCG capsule [id number] Yes Take 0.5 mcg by mouth daily. [provider]  Active Self, Spouse/Significant Other, Pharmacy Records  carvedilol  (COREG ) 6.25 MG tablet [id number] Yes Take 6.25 mg by mouth 2 (two) times daily. [provider]  Active Self, Spouse/Significant Other, Pharmacy Records  ceFEPIme  2 g in sodium chloride  0.9 % 100 mL [id number] Yes Inject 2 g into the vein every Monday, Wednesday, and Friday. cefepime  2g with each dialysis MWF--for 2 weeks. End of treatment would be July 21, 2024 Pearlean Manus, MD  Active   divalproex  (DEPAKOTE ) 250 MG DR tablet [id number]  Take 250 mg by mouth 2 (two) times daily.  Patient not taking: Reported on 07/15/2024   [provider]  Active Self, Spouse/Significant Other, Pharmacy Records  ferric citrate  (AURYXIA ) 1 GM 210 MG(Fe) tablet [id number]  Take 420 mg by mouth 3 (three) times daily with meals. [provider]  Active Self, Spouse/Significant Other, Pharmacy Records  Fluticasone -Umeclidin-Vilant (TRELEGY ELLIPTA ) 200-62.5-25 MCG/ACT AEPB [id number]  Inhale 1 puff into the lungs daily at 12 noon. Pearlean Manus, MD  Active   loratadine  (CLARITIN ) 10 MG tablet [id number] Yes Take 10 mg by  mouth daily. [provider]  Active Self, Spouse/Significant Other, Pharmacy Records  midodrine  (PROAMATINE ) 10 MG tablet [id number] Yes Take 1 tablet (10  mg total) by mouth every Monday, Wednesday, and Friday. Mon, Wed, and Friday---with Hemodialysis Pearlean Manus, MD  Active   Multiple Vitamins-Minerals (MENS MULTIVITAMIN PO) [id number] Yes Take 1 tablet by mouth daily. [provider]  Active Self, Spouse/Significant Other, Pharmacy Records  OVER THE COUNTER MEDICATION [id number] Yes Take 2 tablets by mouth daily at 6 (six) AM. [provider]  Active Self, Spouse/Significant Other, Pharmacy Records           Med Note SOUNDRA, EBONY J   Thu Jan 17, 2024  8:39 AM) Oil of Oregano with black seed oil   tamsulosin  (FLOMAX ) 0.4 MG CAPS capsule [id number] Yes Take 0.4 mg by mouth daily after supper. [provider]  Active Self, Spouse/Significant Other, Pharmacy Records            Home Care and Equipment/Supplies: Were Home Health Services Ordered?: NA Any new equipment or medical supplies ordered?: NA  Functional Questionnaire: Do you need assistance with bathing/showering or dressing?: No Do you need assistance with meal preparation?: No Do you need assistance with eating?: No Do you have difficulty maintaining continence: No Do you need assistance with getting out of bed/getting out of a chair/moving?: No Do you have difficulty managing or taking your medications?: Yes (spouse uses pill box for medications)  Follow up appointments reviewed: PCP Follow-up appointment confirmed?: No (Patient states he will call for a hospital follow up appointment) MD Provider Line Number:[phone] Given: No Specialist Hospital Follow-up appointment confirmed?: Yes Date of Specialist follow-up appointment?: 07/22/24 Follow-Up Specialty Provider:: Hospital for paracentesis, then will have an appointment with GI per patient Do you need transportation to your follow-up appointment?: No Do you understand care options if your condition(s) worsen?: Yes-patient verbalized understanding (Reviewed signs of infection and when to seek  medication attention)  SDOH Interventions Today    Flowsheet Row Most Recent Value  SDOH Interventions   Food Insecurity Interventions Intervention Not Indicated  Housing Interventions Intervention Not Indicated  Transportation Interventions Intervention Not Indicated  Utilities Interventions Intervention Not Indicated    Lavaun DOROTHA Seeds RN, MSN [facility]  [facility], [facility] Health RN Care Manager Direct Dial : [phone]  Fax: [phone] Website: [email or web address]   "

## 2024-07-15 NOTE — Telephone Encounter (Signed)
"   RE: Paracentesis Received: Nilsa Stager, Therisa ORN, NP  Cinderella Deatrice FALCON, MD; Burnis Devere BIRCH Cc: Jeanell Graeme RAMAN, CMA; Gaylene Madelin CROME, LPN Ora neptune, so sorry, it was busy back at work today with so many on vacation LOL!  I will have to get him in with IR in [facility] for para. Dialysis M, W, F. Avoiding para on same day as dialysis Alphonsa Salmon can only do it on M, W, F).  I have copied Mindy/[place] List on here. Autumn would be the one to also help from the [facility] clinic.  Mindy/Saranya Harlin: can we please arrange paracentesis on 12/30 if at all possible? Albumin  per protocol. Needs cell count and culture with fluid analysis.       Previous Messages    ----- Message ----- From: Cinderella Deatrice FALCON, MD Sent: 07/14/2024   4:42 PM EST To: Devere BIRCH Burnis; Therisa ORN Stager, NP Subject: RE: Paracentesis                              That's fine if nothing is available early  Hi Therisa  He needs a repeat paracentesis , how do you arrange that without seeing the patient ----- Message ----- From: Burnis Devere BIRCH Sent: 07/14/2024   3:15 PM EST To: Deatrice FALCON Cinderella, MD Subject: RE: Paracentesis                              [name] has scheduled him to see Therisa on 2/5 at 1030am. She doesn't have anything any sooner. Will this be OK? ----- Message ----- From: Cinderella Deatrice FALCON, MD Sent: 07/11/2024   3:04 PM EST To: Devere BIRCH Burnis; Therisa ORN Stager, NP; Autumn S * Subject: Paracentesis                                  Hi Devere,  Can you please schedule a follow up appointment for this patient in 2-3 weeks with Therisa?  ======================================  Hi Therisa  Question ; they are discharging him again with antibiotics after hemodialysis and no paracentesis was done as IR was not available , how did you arrange his outpatient paracentesis before . He needs a paracentesis on Monday or Tuesday ;  I want to make sure he is getting better or else would get a CT   Thanks,  [name]  Faizan [name] , MD Gastroenterology and Hepatology [facility] "

## 2024-07-15 NOTE — Telephone Encounter (Addendum)
 Spoke to pt's spouse Ronal (on dpr) informed that provider is aware that he couldn't get para done today and  para has been scheduled for 07/22/24 arrive at 9:45 am to check in at [facility]   Provider informed that all facilities are booked and can't not get pt in today. She said ok to schedule para for next Tuesday 07/22/24.

## 2024-07-22 ENCOUNTER — Ambulatory Visit (HOSPITAL_COMMUNITY)
Admission: RE | Admit: 2024-07-22 | Discharge: 2024-07-22 | Disposition: A | Discharge status: Home / Self Care | Source: Ambulatory Visit | Attending: Internal Medicine | Admitting: Internal Medicine

## 2024-07-22 ENCOUNTER — Other Ambulatory Visit (HOSPITAL_COMMUNITY)

## 2024-07-22 ENCOUNTER — Inpatient Hospital Stay (HOSPITAL_COMMUNITY): Admission: RE | Admit: 2024-07-22 | Discharge: 2024-07-22 | Attending: Gastroenterology | Admitting: Gastroenterology

## 2024-07-22 ENCOUNTER — Ambulatory Visit: Payer: Self-pay | Admitting: Cardiovascular Disease

## 2024-07-22 DIAGNOSIS — E1122 Type 2 diabetes mellitus with diabetic chronic kidney disease: Secondary | ICD-10-CM | POA: Insufficient documentation

## 2024-07-22 DIAGNOSIS — M48061 Spinal stenosis, lumbar region without neurogenic claudication: Secondary | ICD-10-CM | POA: Insufficient documentation

## 2024-07-22 DIAGNOSIS — I120 Hypertensive chronic kidney disease with stage 5 chronic kidney disease or end stage renal disease: Secondary | ICD-10-CM | POA: Insufficient documentation

## 2024-07-22 DIAGNOSIS — I083 Combined rheumatic disorders of mitral, aortic and tricuspid valves: Secondary | ICD-10-CM | POA: Diagnosis not present

## 2024-07-22 DIAGNOSIS — R188 Other ascites: Secondary | ICD-10-CM | POA: Insufficient documentation

## 2024-07-22 DIAGNOSIS — K7460 Unspecified cirrhosis of liver: Secondary | ICD-10-CM | POA: Diagnosis not present

## 2024-07-22 DIAGNOSIS — N186 End stage renal disease: Secondary | ICD-10-CM | POA: Insufficient documentation

## 2024-07-22 DIAGNOSIS — I1311 Hypertensive heart and chronic kidney disease without heart failure, with stage 5 chronic kidney disease, or end stage renal disease: Secondary | ICD-10-CM | POA: Insufficient documentation

## 2024-07-22 DIAGNOSIS — Z992 Dependence on renal dialysis: Secondary | ICD-10-CM | POA: Insufficient documentation

## 2024-07-22 DIAGNOSIS — I350 Nonrheumatic aortic (valve) stenosis: Secondary | ICD-10-CM | POA: Diagnosis not present

## 2024-07-22 HISTORY — PX: IR PARACENTESIS: IMG2679

## 2024-07-22 LAB — ECHOCARDIOGRAM COMPLETE
AR max vel: 0.68 cm2
AV Area VTI: 0.61 cm2
AV Area mean vel: 0.67 cm2
AV Mean grad: 30 mmHg
AV Peak grad: 52.1 mmHg
Ao pk vel: 3.61 m/s
Area-P 1/2: 4.21 cm2
MV M vel: 4.56 m/s
MV Peak grad: 83.2 mmHg
P 1/2 time: 511 ms
S' Lateral: 3.4 cm

## 2024-07-22 LAB — BODY FLUID CELL COUNT WITH DIFFERENTIAL
Eos, Fluid: 0 %
Lymphs, Fluid: 26 %
Monocyte-Macrophage-Serous Fluid: 60 % (ref 50–90)
Neutrophil Count, Fluid: 11 % (ref 0–25)
Other Cells, Fluid: 3 %
Total Nucleated Cell Count, Fluid: 426 uL (ref 0–1000)

## 2024-07-22 MED ORDER — LIDOCAINE-EPINEPHRINE 1 %-1:100000 IJ SOLN
INTRAMUSCULAR | Status: AC
  Filled 2024-07-22: qty 20

## 2024-07-22 MED ORDER — LIDOCAINE-EPINEPHRINE 1 %-1:100000 IJ SOLN
10.0000 mL | Freq: Once | INTRAMUSCULAR | Status: AC
  Administered 2024-07-22: 10 mL via INTRADERMAL

## 2024-07-22 MED ORDER — PERFLUTREN LIPID MICROSPHERE
1.0000 mL | INTRAVENOUS | Status: AC | PRN
  Administered 2024-07-22: 3 mL via INTRAVENOUS

## 2024-07-22 NOTE — Progress Notes (Signed)
*  PRELIMINARY RESULTS* Echocardiogram 2D Echocardiogram has been performed with Definity .  [name] [name] 07/22/2024, 3:25 PM

## 2024-07-22 NOTE — Procedures (Signed)
 PROCEDURE SUMMARY:  Successful ultrasound guided paracentesis from the right lower quadrant.  Yielded 5 L of clear yellow fluid.  No immediate complications.  The patient tolerated the procedure well.   Specimen sent for labs.  EBL < 2 mL  The patient has required >/=2 paracenteses in a 30 day period and a screening evaluation by the [facility] Interventional Radiology Portal Hypertension [facility] has been arranged.  Ester Sides, MD Pager: [phone]

## 2024-07-24 ENCOUNTER — Ambulatory Visit: Payer: Self-pay | Admitting: Gastroenterology

## 2024-07-25 ENCOUNTER — Ambulatory Visit: Admitting: Nurse Practitioner

## 2024-07-25 LAB — BODY FLUID CULTURE W GRAM STAIN: Culture: NO GROWTH

## 2024-07-31 ENCOUNTER — Encounter: Payer: Self-pay | Admitting: Cardiovascular Disease

## 2024-07-31 ENCOUNTER — Ambulatory Visit: Attending: Cardiovascular Disease | Admitting: Cardiovascular Disease

## 2024-07-31 ENCOUNTER — Telehealth: Payer: Self-pay

## 2024-07-31 VITALS — BP 116/62 | HR 62 | Ht 64.0 in | Wt 215.7 lb

## 2024-07-31 DIAGNOSIS — I350 Nonrheumatic aortic (valve) stenosis: Secondary | ICD-10-CM | POA: Diagnosis present

## 2024-07-31 NOTE — Patient Instructions (Signed)
 Medication Instructions:  Your physician recommends that you continue on your current medications as directed. Please refer to the Current Medication list given to you today.  *If you need a refill on your cardiac medications before your next appointment, please call your pharmacy*  Lab Work: none If you have labs (blood work) drawn today and your tests are completely normal, you will receive your results only by: MyChart Message (if you have MyChart) OR A paper copy in the mail If you have any lab test that is abnormal or we need to change your treatment, we will call you to review the results.  Testing/Procedures: Your physician has requested that you have an echocardiogram in 6 months at [facility]. Echocardiography is a painless test that uses sound waves to create images of your heart. It provides your doctor with information about the size and shape of your heart and how well your hearts chambers and valves are working. This procedure takes approximately one hour. There are no restrictions for this procedure. Please do NOT wear cologne, perfume, aftershave, or lotions (deodorant is allowed). Please arrive 15 minutes prior to your appointment time.  Please note: We ask at that you not bring children with you during ultrasound (echo/ vascular) testing. Due to room size and safety concerns, children are not allowed in the ultrasound rooms during exams. Our front office staff cannot provide observation of children in our lobby area while testing is being conducted. An adult accompanying a patient to their appointment will only be allowed in the ultrasound room at the discretion of the ultrasound technician under special circumstances. We apologize for any inconvenience.   Follow-Up: At [facility], you and your health needs are our priority.  As part of our continuing mission to provide you with exceptional heart care, our providers are all part of one team.  This team includes your  primary Cardiologist (physician) and Advanced Practice Providers or APPs (Physician Assistants and Nurse Practitioners) who all work together to provide you with the care you need, when you need it.  Your next appointment:   6 month(s)  Provider:   Dr Verlin   We recommend signing up for the patient portal called MyChart.  Sign up information is provided on this After Visit Summary.  MyChart is used to connect with patients for Virtual Visits (Telemedicine).  Patients are able to view lab/test results, encounter notes, upcoming appointments, etc.  Non-urgent messages can be sent to your provider as well.   To learn more about what you can do with MyChart, go to [email or web address].   Other Instructions

## 2024-07-31 NOTE — Progress Notes (Signed)
 "  Structural Heart Clinic Note  Chief Complaint  Patient presents with   Follow-up    Aortic stenosis   History of Present Illness: 74 yo male with history of CAD s/p 2V CABG, ESRD on HD, DM, COPD, HTN, HLD, sleep apnea, second degree AV block, cirrhosis with ascites and severe aortic stenosis who is here today for follow up. I met him in the structural heart clinic in August 2025 to discuss his aortic stenosis. Echo 02/07/24 with LVEF=45-50% with septal and inferior wall hypokinesis. Moderate mitral regurgitation. Severe low flow/low gradient aortic stenosis with mean gradient 20 mmHg, AVA 0.6 cm2, SVI 21. He had a 2V CABG in November 2023 (LIMA to LAD, SVG to PDA) per Dr. Shyrl. His last cardiac cath was prior to his bypass surgery in 2023. He has ESRD and is on HD. He is followed in our EP clinic by Dr. Inocencio for Mobitz 2 type 1 AV block but has not required a pacemaker. When I saw him in August 2025 he was feeling well. He has been diagnosed with cirrhosis and has been having to have ascited removed over the few months. Has treated for bacterial peritonitis. Echo January 2026 with LVEF=50-55%. Moderate mitral regurgitation. Severe paradoxical low flow/low gradient aortic stenosis with mean gradient 30 mmHg, AVA 0.61 cm2, DI 0.24, SVI 26.   He is here today for follow up. The patient denies any chest pain, dyspnea, palpitations, lower extremity edema, orthopnea, PND, dizziness, near syncope or syncope. He is feeling well. He is tolerating HD. He continues to have ascites.   He lives in [place], [place] with his wife. He is retired as a naval architect. He has no active dental issues.   Primary Care Physician: Carlette Benita Area, MD Primary Cardiologist: [facility] Referring Cardiologist: Mona  Past Medical History:  Diagnosis Date   Anemia    Arthritis    Asthma    Bell palsy    left side of face- comes and goes per pt on 11/12/23   CAD (coronary artery disease)    a. 2014 MV: abnl w/ infap  ischemia; b. 03/2013 Cath: aneurysmal bleb in the LAD w/ otw nonobs dzs-->Med Rx.   Chronic back pain    Chronic kidney disease    on dialysis - mon wed fri   Chronic knee pain    a. 09/2015 s/p R TKA.   Chronic pain    Chronic shoulder pain    Chronic sinusitis    COPD (chronic obstructive pulmonary disease) (HCC)    Diabetes mellitus without complication (HCC)    type II , no meds, does not check blood sugar   Dyspnea    with exertion   Dysrhythmia    2nd degree AV block   ESRD on peritoneal dialysis (HCC)    on peritoneal dialysis, [facility] - mon wed fri   Essential hypertension    GERD (gastroesophageal reflux disease)    Gout    Hepatomegaly    noted on noncontrast CT 2015   History of hiatal hernia    Hyperlipidemia    Hypothyroidism    Lateral meniscus tear    Myocardial infarction (HCC) 07/2021   Neuromuscular disorder (HCC)    Obesity    Truncal   Obstructive sleep apnea    does not use cpap    On home oxygen  therapy    uses 2L when needed   Pneumonia    PUD (peptic ulcer disease)    remote, reports f/u EGD about  8 years ago unremarkable    Reactive airway disease    related to exposure to chemical during 9/11   Sinusitis    Substance abuse (HCC)    hx in past   Vitamin D  deficiency     Past Surgical History:  Procedure Laterality Date   A/V FISTULAGRAM N/A 02/28/2023   Procedure: A/V Fistulagram;  Surgeon: Lanis Fonda BRAVO, MD;  Location: [facility] INVASIVE CV LAB;  Service: Cardiovascular;  Laterality: N/A;   A/V FISTULAGRAM N/A 10/22/2023   Procedure: A/V Fistulagram;  Surgeon: Sheree Penne Bruckner, MD;  Location: [facility] INVASIVE CV LAB;  Service: Cardiovascular;  Laterality: N/A;   ASAD LT SHOULDER  12/15/2008   left shoulder   AV FISTULA PLACEMENT Left 08/09/2016   Procedure: BRACHIOCEPHALIC ARTERIOVENOUS (AV) FISTULA CREATION LEFT ARM;  Surgeon: Carlin BRAVO Haddock, MD;  Location: [facility] OR;  Service: Vascular;  Laterality: Left;   BASCILIC VEIN  TRANSPOSITION Left 11/13/2023   Procedure: TRANSPOSITION, VEIN, BASILIC;  Surgeon: Sheree Penne Bruckner, MD;  Location: [facility] OR;  Service: Vascular;  Laterality: Left;   CAPD INSERTION N/A 10/07/2018   Procedure: LAPAROSCOPIC PERITONEAL CATHETER PLACEMENT;  Surgeon: Stevie Herlene Righter, MD;  Location: [facility] ORS;  Service: General;  Laterality: N/A;   CATARACT EXTRACTION W/PHACO Left 03/28/2016   Procedure: CATARACT EXTRACTION PHACO AND INTRAOCULAR LENS PLACEMENT LEFT EYE;  Surgeon: Oneil Platts, MD;  Location: [facility] ORS;  Service: Ophthalmology;  Laterality: Left;  CDE: 4.77   CATARACT EXTRACTION W/PHACO Right 04/11/2016   Procedure: CATARACT EXTRACTION PHACO AND INTRAOCULAR LENS PLACEMENT RIGHT EYE; CDE:  4.74;  Surgeon: Oneil Platts, MD;  Location: [facility] ORS;  Service: Ophthalmology;  Laterality: Right;   COLONOSCOPY  10/15/2008   Fields: Rectal polyp obliterated, not retrieved, hemorrhoids, single ascending colon diverticulum near the CV. Next colonoscopy April 2020   COLONOSCOPY N/A 12/25/2014   SLF: 1. Colorectal polyps (2) removed 2. Small internal hemorrhoids 3. the left colon is severely redundant. hyperplastic polyps   CORONARY ARTERY BYPASS GRAFT N/A 06/05/2022   Procedure: OFF PUMP CORONARY ARTERY BYPASS GRAFTING (CABG) X 2 BYPASSES USING LEFT INTERNAL MAMMARY ARTERY AND RIGHT LEG GREATER SAPHENOUS VEIN HARVESTED ENDOSCOPICALLY;  Surgeon: Shyrl Linnie KIDD, MD;  Location: [facility] OR;  Service: Open Heart Surgery;  Laterality: N/A;   CORONARY STENT INTERVENTION N/A 07/25/2021   Procedure: CORONARY STENT INTERVENTION;  Surgeon: Wonda Sharper, MD;  Location: [facility] INVASIVE CV LAB;  Service: Cardiovascular;  Laterality: N/A;   CORONARY STENT INTERVENTION N/A 12/26/2021   Procedure: CORONARY STENT INTERVENTION;  Surgeon: Mady Bruckner, MD;  Location: [facility] INVASIVE CV LAB;  Service: Cardiovascular;  Laterality: N/A;   CORONARY STENT INTERVENTION N/A 01/20/2022   Procedure: CORONARY STENT  INTERVENTION;  Surgeon: Wonda Sharper, MD;  Location: [facility] INVASIVE CV LAB;  Service: Cardiovascular;  Laterality: N/A;   DOPPLER ECHOCARDIOGRAPHY     ESOPHAGOGASTRODUODENOSCOPY N/A 12/25/2014   SLF: 1. Anemia most likely due to CRI, gastritis, gastric polyps 2. Moderate non-erosive gastriits and mild duodenitis.  3.TWo large gstric polyps removed.    EYE SURGERY  12/22/2010   tear duct probing-[place]   FOREIGN BODY REMOVAL  03/29/2011   Procedure: REMOVAL FOREIGN BODY EXTREMITY;  Surgeon: Taft Minerva, MD;  Location: [facility] ORS;  Service: Orthopedics;  Laterality: Right;  Removal Foreign Body Right Thumb   INSERTION OF DIALYSIS CATHETER N/A 11/13/2023   Procedure: INSERTION OF DIALYSIS CATHETER;  Surgeon: Sheree Penne Bruckner, MD;  Location: [facility] OR;  Service: Vascular;  Laterality: N/A;   IR FLUORO GUIDE  CV LINE RIGHT  08/06/2018   IR FLUORO GUIDE CV LINE RIGHT  02/17/2023   IR PARACENTESIS  06/26/2024   IR PARACENTESIS  07/08/2024   IR PARACENTESIS  07/22/2024   IR US  GUIDE VASC ACCESS RIGHT  08/06/2018   IR US  GUIDE VASC ACCESS RIGHT  02/17/2023   KNEE ARTHROSCOPY  10/16/2007   left   KNEE ARTHROSCOPY WITH LATERAL MENISECTOMY Right 10/14/2015   Procedure: LEFT KNEE ARTHROSCOPY WITH PARTIAL LATERAL MENISECTOMY;  Surgeon: Taft FORBES Minerva, MD;  Location: [facility]S;  Service: Orthopedics;  Laterality: Right;   LEFT HEART CATH AND CORONARY ANGIOGRAPHY N/A 07/25/2021   Procedure: LEFT HEART CATH AND CORONARY ANGIOGRAPHY;  Surgeon: Wonda Sharper, MD;  Location: [facility] INVASIVE CV LAB;  Service: Cardiovascular;  Laterality: N/A;   LEFT HEART CATH AND CORONARY ANGIOGRAPHY N/A 12/26/2021   Procedure: LEFT HEART CATH AND CORONARY ANGIOGRAPHY;  Surgeon: Mady Bruckner, MD;  Location: [facility] INVASIVE CV LAB;  Service: Cardiovascular;  Laterality: N/A;   LEFT HEART CATH AND CORONARY ANGIOGRAPHY N/A 01/20/2022   Procedure: LEFT HEART CATH AND CORONARY ANGIOGRAPHY;  Surgeon: Wonda Sharper, MD;   Location: [facility] INVASIVE CV LAB;  Service: Cardiovascular;  Laterality: N/A;   LEFT HEART CATHETERIZATION WITH CORONARY ANGIOGRAM N/A 03/28/2013   Procedure: LEFT HEART CATHETERIZATION WITH CORONARY ANGIOGRAM;  Surgeon: Alm LELON Clay, MD;  Location: [facility] CATH LAB;  Service: Cardiovascular;  Laterality: N/A;   NM MYOVIEW  LTD     PENILE PROSTHESIS IMPLANT N/A 08/16/2015   Procedure: PENILE PROTHESIS INFLATABLE, three piece, Excisional biopsy of Penile ulcer, Penile molding;  Surgeon: Arlena Gal, MD;  Location: [facility] ORS;  Service: Urology;  Laterality: N/A;   PENILE PROSTHESIS IMPLANT N/A 12/24/2017   Procedure: REMOVAL AND  REPLACEMENT  COLOPLAST PENILE PROSTHESIS;  Surgeon: Carolee Sherwood JONETTA DOUGLAS, MD;  Location: [facility] ORS;  Service: Urology;  Laterality: N/A;   PERIPHERAL VASCULAR BALLOON ANGIOPLASTY  02/28/2023   Procedure: PERIPHERAL VASCULAR BALLOON ANGIOPLASTY;  Surgeon: Lanis Fonda FORBES, MD;  Location: [facility] INVASIVE CV LAB;  Service: Cardiovascular;;   PORT-A-CATH REMOVAL N/A 02/19/2023   Procedure: REMOVAL OF PERITONEAL DIALYSIS CATHETER;  Surgeon: Lanis Fonda FORBES, MD;  Location: [facility] OR;  Service: Vascular;  Laterality: N/A;   QUADRICEPS TENDON REPAIR  07/21/2011   Procedure: REPAIR QUADRICEP TENDON;  Surgeon: Taft Minerva, MD;  Location: [facility] ORS;  Service: Orthopedics;  Laterality: Right;   REVISON OF ARTERIOVENOUS FISTULA Left 11/13/2023   Procedure: REVISON OF ARTERIOVENOUS FISTULA;  Surgeon: Sheree Penne Bruckner, MD;  Location: [facility] OR;  Service: Vascular;  Laterality: Left;   RIGHT/LEFT HEART CATH AND CORONARY ANGIOGRAPHY N/A 05/30/2022   Procedure: RIGHT/LEFT HEART CATH AND CORONARY ANGIOGRAPHY;  Surgeon: Dann Candyce RAMAN, MD;  Location: [facility] INVASIVE CV LAB;  Service: Cardiovascular;  Laterality: N/A;   TEE WITHOUT CARDIOVERSION N/A 06/05/2022   Procedure: TRANSESOPHAGEAL ECHOCARDIOGRAM (TEE);  Surgeon: Shyrl Linnie KIDD, MD;  Location: [facility] OR;  Service: Open Heart Surgery;   Laterality: N/A;   TOENAIL EXCISION     removed x2-bilateral   UMBILICAL HERNIA REPAIR  07/17/2005   [place]    Current Outpatient Medications  Medication Sig Dispense Refill   acetaminophen  (TYLENOL ) 325 MG tablet Take 2 tablets (650 mg total) by mouth every 6 (six) hours as needed for mild pain (pain score 1-3), moderate pain (pain score 4-6), fever or headache.     albuterol  (VENTOLIN  HFA) 108 (90 Base) MCG/ACT inhaler Inhale 2 puffs into the lungs every 6 (six) hours as needed for  wheezing or shortness of breath. 18 g 11   amLODipine  (NORVASC ) 5 MG tablet Take 1 tablet (5 mg total) by mouth daily. 30 tablet 5   ascorbic acid  (VITAMIN C ) 500 MG tablet Take 1 tablet (500 mg total) by mouth daily. 100 tablet 0   aspirin  EC 81 MG tablet Take 1 tablet (81 mg total) by mouth daily with breakfast. Swallow whole. 30 tablet 11   atorvastatin  (LIPITOR ) 80 MG tablet Take 1 tablet (80 mg total) by mouth daily. 30 tablet 1   benzonatate  (TESSALON ) 100 MG capsule Take 100 mg by mouth daily.     calcitRIOL  (ROCALTROL ) 0.5 MCG capsule Take 0.5 mcg by mouth daily.     ceFEPIme  2 g in sodium chloride  0.9 % 100 mL Inject 2 g into the vein every Monday, Wednesday, and Friday. cefepime  2g with each dialysis MWF--for 2 weeks. End of treatment would be July 21, 2024     divalproex  (DEPAKOTE ) 250 MG DR tablet Take 250 mg by mouth 2 (two) times daily.     ferric citrate  (AURYXIA ) 1 GM 210 MG(Fe) tablet Take 420 mg by mouth 3 (three) times daily with meals.     Fluticasone -Umeclidin-Vilant (TRELEGY ELLIPTA ) 200-62.5-25 MCG/ACT AEPB Inhale 1 puff into the lungs daily at 12 noon. 60 each 5   loratadine  (CLARITIN ) 10 MG tablet Take 10 mg by mouth daily.     midodrine  (PROAMATINE ) 10 MG tablet Take 1 tablet (10 mg total) by mouth every Monday, Wednesday, and Friday. Mon, Wed, and Friday---with Hemodialysis 45 tablet 3   Multiple Vitamins-Minerals (MENS MULTIVITAMIN PO) Take 1 tablet by mouth daily.     OVER THE  COUNTER MEDICATION Take 2 tablets by mouth daily at 6 (six) AM.     tamsulosin  (FLOMAX ) 0.4 MG CAPS capsule Take 0.4 mg by mouth daily after supper.     carvedilol  (COREG ) 6.25 MG tablet Take 6.25 mg by mouth 2 (two) times daily. (Patient not taking: Reported on 07/31/2024)     No current facility-administered medications for this visit.    Allergies  Allergen Reactions   Tramadol  Itching   Opana [Oxymorphone Hcl] Itching    Social History   Socioeconomic History   Marital status: Married    Spouse name: Not on file   Number of children: 2   Years of education: 12th grade   Highest education level: Not on file  Occupational History   Occupation: disabled   Occupation:      Associate Professor: UNEMPLOYED  Tobacco Use   Smoking status: Former    Current packs/day: 0.00    Average packs/day: 1 pack/day for 25.0 years (25.0 ttl pk-yrs)    Types: Cigarettes    Start date: 03/27/1985    Quit date: 03/27/2010    Years since quitting: 14.3   Smokeless tobacco: Never   Tobacco comments:    Quit in 2011  Vaping Use   Vaping status: Never Used  Substance and Sexual Activity   Alcohol  use: Yes    Comment: occasionally liquor   Drug use: Yes    Types: Marijuana    Comment: cocaine- last time used- 11/24/2017 , marijuana- last use 11/10/23 withhold 24 hrs prior to procedure   Sexual activity: Yes    Birth control/protection: None  Other Topics Concern   Not on file  Social History Narrative   He quit smoking in 2010. 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 He developed pulmonary problems, became  disabled because of lower airway disease in 2009.       WATCHES BASKETBALL. HIS TEAM IS [employer].   Social Drivers of Health   Tobacco Use: Medium Risk (07/31/2024)   Patient History    Smoking Tobacco Use: Former    Smokeless Tobacco Use: Never    Passive Exposure: Not on file  Financial Resource Strain: Low Risk  (03/05/2023)   Received from [facility]   Overall Financial Resource Strain (CARDIA)    Difficulty of Paying Living Expenses: Not hard at all  Food Insecurity: No Food Insecurity (07/15/2024)   Epic    Worried About Programme Researcher, Broadcasting/film/video in the Last Year: Never true    Ran Out of Food in the Last Year: Never true  Transportation Needs: No Transportation Needs (07/15/2024)   Epic    Lack of Transportation (Medical): No    Lack of Transportation (Non-Medical): No  Physical Activity: Sufficiently Active (12/29/2021)   Exercise Vital Sign    Days of Exercise per Week: 3 days    Minutes of Exercise per Session: 60 min  Stress: Patient Declined (03/12/2023)   Received from [facility]   [employer] of Occupational Health - Occupational Stress Questionnaire    Feeling of Stress : Patient declined  Social Connections: Socially Integrated (07/09/2024)   Social Connection and Isolation Panel    Frequency of Communication with Friends and Family: More than three times a week    Frequency of Social Gatherings with Friends and Family: More than three times a week    Attends Religious Services: More than 4 times per year    Active Member of [employer] or Organizations: Yes    Attends Banker Meetings: More than 4 times per year    Marital Status: Married  Catering Manager Violence: Not At Risk (07/15/2024)   Epic    Fear of Current or Ex-Partner: No    Emotionally Abused: No    Physically Abused: No    Sexually Abused: No  Depression (PHQ2-9): Low Risk (07/15/2024)   Depression (PHQ2-9)    PHQ-2 Score: 0  Alcohol  Screen: Low Risk (12/29/2021)   Alcohol  Screen    Last Alcohol  Screening Score (AUDIT): 0  Housing: Unknown (07/15/2024)   Epic    Unable to Pay for Housing in the Last Year: No    Number of Times Moved in the Last Year: Not on file    Homeless in the Last Year: No  Utilities: Not At Risk (07/15/2024)   Epic    Threatened with loss of utilities: No  Health Literacy: Not on file    Family  History  Problem Relation Age of Onset   Hypertension Mother        MI   Cancer Mother        breast    Diabetes Mother    Diabetes Father    Hypertension Father    Hypertension Sister    Diabetes Sister    Arthritis Other    Asthma Other    Lung disease Other    Anesthesia problems Neg Hx    Hypotension Neg Hx    Malignant hyperthermia Neg Hx    Pseudochol deficiency Neg Hx    Colon cancer Neg Hx     Review of Systems:  As stated in the HPI and otherwise negative.   BP 116/62   Pulse 62   Ht 5' 4 (1.626 m)   Wt 215 lb 11.2 oz (97.8 kg)  SpO2 93%   BMI 37.02 kg/m   Physical Examination: General: Well developed, well nourished, NAD  SKIN: warm, dry. Neuro: No focal deficits  Psychiatric: Mood and affect normal  Neck: No JVD Lungs:Clear bilaterally, no wheezes, rhonci, crackles Cardiovascular: Regular rate and rhythm. Harsh systolic murmur.  Abdomen:Firm Extremities: No lower extremity edema.    EKG:  EKG is not ordered today. The ekg ordered today demonstrates   Echo January 2026:  1. Left ventricular ejection fraction, by estimation, is 50 to 55%. The  left ventricle has low normal function. Left ventricular endocardial  border not optimally defined to evaluate regional wall motion. There is  mild left ventricular hypertrophy. Left  ventricular diastolic parameters are consistent with Grade II diastolic  dysfunction (pseudonormalization). Elevated left atrial pressure.   2. Right ventricular systolic function is low normal. The right  ventricular size is mildly enlarged. There is moderately elevated  pulmonary artery systolic pressure.   3. Left atrial size was severely dilated.   4. Right atrial size was mildly dilated.   5. The mitral valve is abnormal. Moderate mitral valve regurgitation. No  evidence of mitral stenosis.   6. The tricuspid valve is abnormal. Tricuspid valve regurgitation is  moderate.   7. Severe paradoxical low flow low gradient  aortic stenosis. AVA VTI  0.61, mean gradient 30 mmHg, DI 0.24, SVI 26. The aortic valve was not  well visualized. There is moderate calcification of the aortic valve.  There is moderate thickening of the aortic  valve. Aortic valve regurgitation is mild.   8. The inferior vena cava is dilated in size with >50% respiratory  variability, suggesting right atrial pressure of 8 mmHg.   FINDINGS   Left Ventricle: Left ventricular ejection fraction, by estimation, is 50  to 55%. The left ventricle has low normal function. Left ventricular  endocardial border not optimally defined to evaluate regional wall motion.  Definity  contrast agent was given IV   to delineate the left ventricular endocardial borders. The left  ventricular internal cavity size was normal in size. There is mild left  ventricular hypertrophy. Left ventricular diastolic parameters are  consistent with Grade II diastolic dysfunction  (pseudonormalization). Elevated left atrial pressure.   Right Ventricle: The right ventricular size is mildly enlarged. Right  vetricular wall thickness was not well visualized. Right ventricular  systolic function is low normal. There is moderately elevated pulmonary  artery systolic pressure. The tricuspid  regurgitant velocity is 3.39 m/s, and with an assumed right atrial  pressure of 8 mmHg, the estimated right ventricular systolic pressure is  54.0 mmHg.   Left Atrium: Left atrial size was severely dilated.   Right Atrium: Right atrial size was mildly dilated.   Pericardium: There is no evidence of pericardial effusion.   Mitral Valve: The mitral valve is abnormal. There is mild thickening of  the mitral valve leaflet(s). There is mild calcification of the mitral  valve leaflet(s). Mild mitral annular calcification. Moderate mitral valve  regurgitation. No evidence of mitral   valve stenosis.   Tricuspid Valve: The tricuspid valve is abnormal. Tricuspid valve  regurgitation is  moderate . No evidence of tricuspid stenosis.   Aortic Valve: Severe paradoxical low flow low gradient aortic stenosis.  AVA VTI 0.61, mean gradient 30 mmHg, DI 0.24, SVI 26. The aortic valve was  not well visualized. There is moderate calcification of the aortic valve.  There is moderate thickening of  the aortic valve. There is moderate aortic valve annular calcification.  Aortic valve regurgitation is mild. Aortic regurgitation PHT measures 511  msec. Aortic valve mean gradient measures 30.0 mmHg. Aortic valve peak  gradient measures 52.1 mmHg. Aortic  valve area, by VTI measures 0.61 cm.   Pulmonic Valve: The pulmonic valve was not well visualized. Pulmonic valve  regurgitation is not visualized. No evidence of pulmonic stenosis.   Aorta: The aortic root is normal in size and structure and the ascending  aorta was not well visualized.   Venous: The inferior vena cava is dilated in size with greater than 50%  respiratory variability, suggesting right atrial pressure of 8 mmHg.   IAS/Shunts: No atrial level shunt detected by color flow Doppler.   Additional Comments: Moderate ascites is present.     LEFT VENTRICLE  PLAX 2D  LVIDd:         5.10 cm   Diastology  LVIDs:         3.40 cm   LV e' medial:    8.58 cm/s  LV PW:         1.10 cm   LV E/e' medial:  20.4  LV IVS:        1.20 cm   LV e' lateral:   10.20 cm/s  LVOT diam:     1.80 cm   LV E/e' lateral: 17.2  LV SV:         53  LV SV Index:   26  LVOT Area:     2.54 cm     RIGHT VENTRICLE  RV S prime:     9.95 cm/s  TAPSE (M-mode): 1.7 cm   LEFT ATRIUM              Index        RIGHT ATRIUM           Index  LA diam:        5.15 cm  2.57 cm/m   RA Area:     24.80 cm  LA Vol (A2C):   101.0 ml 50.42 ml/m  RA Volume:   78.80 ml  39.34 ml/m  LA Vol (A4C):   110.0 ml 54.92 ml/m  LA Biplane Vol: 112.0 ml 55.91 ml/m   AORTIC VALVE  AV Area (Vmax):    0.68 cm  AV Area (Vmean):   0.67 cm  AV Area (VTI):     0.61  cm  AV Vmax:           361.00 cm/s  AV Vmean:          254.000 cm/s  AV VTI:            0.864 m  AV Peak Grad:      52.1 mmHg  AV Mean Grad:      30.0 mmHg  LVOT Vmax:         95.85 cm/s  LVOT Vmean:        66.450 cm/s  LVOT VTI:          0.209 m  LVOT/AV VTI ratio: 0.24  AI PHT:            511 msec    AORTA  Ao Root diam: 3.50 cm   MITRAL VALVE                TRICUSPID VALVE  MV Area (PHT): 4.21 cm     TR Peak grad:   46.0 mmHg  MV Decel Time: 180 msec     TR Vmax:  339.00 cm/s  MR Peak grad: 83.2 mmHg  MR Mean grad: 52.0 mmHg     SHUNTS  MR Vmax:      456.00 cm/s   Systemic VTI:  0.21 m  MR Vmean:     337.0 cm/s    Systemic Diam: 1.80 cm  MV E velocity: 175.00 cm/s  MV A velocity: 103.00 cm/s  MV E/A ratio:  1.70    Recent Labs: 05/21/2024: Pro Brain Natriuretic Peptide >35,000.0 07/11/2024: ALT 21; BUN 42; Creatinine, Ser 10.50; Hemoglobin 10.3; Magnesium  2.4; Platelets 151; Potassium 5.1; Sodium 140     Wt Readings from Last 3 Encounters:  07/31/24 215 lb 11.2 oz (97.8 kg)  07/11/24 211 lb 6.7 oz (95.9 kg)  07/03/24 216 lb 6.4 oz (98.2 kg)     Assessment and Plan:   1. Severe Aortic Valve Stenosis:  He has severe, paradoxical low flow/low gradient aortic valve stenosis. NYHA class 1 symptoms. I have personally reviewed the echo images. The aortic valve is thickened and calcified with limited leaflet mobility. I think he would benefit from AVR but at this time he is asymptomatic. His recent diagnosis of cirrhosis with ascites and SBP will complicate planning for TAVR once he is symptomatic. Given his prior open heart surgery, ESRD, cirrhosis with ascites and advanced age, he is not a good candidate for conventional AVR by surgical approach. I think he may be a good candidate for TAVR when he becomes symptomatic.  I have reviewed the natural history of aortic stenosis with the patient and their family members  who are present today. We have discussed the limitations  of medical therapy and the poor prognosis associated with symptomatic aortic stenosis. We have reviewed potential treatment options, including palliative medical therapy, conventional surgical aortic valve replacement, and transcatheter aortic valve replacement. We discussed treatment options in the context of the patient's specific comorbid medical conditions.   I will repeat his echo in 6 months.       Labs/ tests ordered today include:  Orders Placed This Encounter  Procedures   ECHOCARDIOGRAM COMPLETE   Disposition:   F/U with me in 6 months.   Signed, Lonni Cash, MD, [facility] 07/31/2024 2:33 PM    [facility] [address] [place], [place], [place]  [address] Phone: [phone]; Fax: [phone]     "

## 2024-07-31 NOTE — Telephone Encounter (Signed)
 Copied from CRM [id number]. Topic: General - Call Back - No Documentation >> Jul 31, 2024 10:22 AM Joesph PARAS wrote: Reason for CRM: Patient is calling to speak to [facility] about his CPAP, states has been called several times. Please return call to patient.   Atc x1 lmtcb (have not received any other CRM or phone call as stated in first CRM.)

## 2024-08-01 NOTE — Telephone Encounter (Signed)
 Copied from CRM [id number]. Topic: General - Call Back - No Documentation >> Jul 31, 2024 10:22 AM Joesph PARAS wrote: Reason for CRM: Patient is calling to speak to [facility] about his CPAP, states has been called several times. Please return call to patient. >> Aug 01, 2024 10:46 AM Corean SAUNDERS wrote: Patient missed a call from [facility] regarding his request to speak about his CPAP. E2C2 agent was advised by CAL that [name] was not available at the time and to send a CRM. Patient states he will be home all day.   Called pt and he stated he does not have any questions regarding his cpap machine. Closing enct

## 2024-08-01 NOTE — Telephone Encounter (Signed)
 Copied from CRM [id number]. Topic: General - Call Back - No Documentation >> Jul 31, 2024 10:22 AM [name] [name] wrote: Reason for CRM: Patient is calling to speak to [facility] about his CPAP, states has been called several times. Please return call to patient. >> Aug 01, 2024 10:46 AM [name] [name] wrote: Patient missed a call from [facility] regarding his request to speak about his CPAP. E2C2 agent was advised by CAL that [name] was not available at the time and to send a CRM. Patient states he will be home all day.

## 2024-08-09 LAB — ACID FAST CULTURE WITH REFLEXED SENSITIVITIES (MYCOBACTERIA): Acid Fast Culture: NEGATIVE

## 2024-08-19 ENCOUNTER — Ambulatory Visit: Admitting: Internal Medicine

## 2024-08-21 ENCOUNTER — Ambulatory Visit: Admitting: Gastroenterology

## 2024-08-21 ENCOUNTER — Encounter: Payer: Self-pay | Admitting: Gastroenterology

## 2024-08-21 VITALS — BP 109/62 | HR 61 | Temp 98.6°F | Ht 64.0 in | Wt 214.6 lb

## 2024-08-21 DIAGNOSIS — Z860102 Personal history of hyperplastic colon polyps: Secondary | ICD-10-CM

## 2024-08-21 DIAGNOSIS — K7469 Other cirrhosis of liver: Secondary | ICD-10-CM | POA: Diagnosis not present

## 2024-08-21 DIAGNOSIS — K219 Gastro-esophageal reflux disease without esophagitis: Secondary | ICD-10-CM

## 2024-08-21 DIAGNOSIS — K652 Spontaneous bacterial peritonitis: Secondary | ICD-10-CM

## 2024-08-21 MED ORDER — CIPROFLOXACIN HCL 500 MG PO TABS: 500.0000 mg | ORAL_TABLET | Freq: Every day | ORAL | 3 refills | Status: AC

## 2024-08-21 NOTE — Patient Instructions (Signed)
 I have sent in Cipro  500 mg tablet. You will take this once a day ongoing. This is to help decrease the risk of any further infection in your belly.  Please have blood work done in the next few weeks.  I am discussing with anesthesia here regarding endoscopy, and we may have to refer you to [facility].  We will see you in 3 months!   I enjoyed seeing you again today! I value our relationship and want to provide genuine, compassionate, and quality care. You may receive a survey regarding your visit with me, and I welcome your feedback! Thanks so much for taking the time to complete this. I look forward to seeing you again.      Therisa MICAEL Stager, PhD, ANP-BC [facility]

## 2024-08-21 NOTE — Progress Notes (Addendum)
 "   Gastroenterology Office Note     Primary Care Physician:  Carlette Benita Area, MD  Primary Gastroenterologist: Dr Cindie   Chief Complaint   Chief Complaint  Patient presents with   Follow-up    Follow up on hepatic cirrhosis and GERD. Pt states nothing is bothering him today     History of Present Illness   [name] [name] is a 74 y.o. male presenting today with a history of GERD, multifactorial anemia with chronic disease/IDA, previously on peritoneal dialysis complicated by previous intra-abdominal infection from peritoneal dialysis catheter and now currently on HD, cirrhosis, gallbladder polyp, severe aortic stenosis, CAD s/p CABG, diabetes, COPD, HTN, HLD, OSA, CHF, culture negative neutrocytic ascites in Dec 2025 with repeat para increased WBC count despite outpatient treatment and inpatient end of Dec 2025 with IV antibiotics.   Outpatient para on 07/22/24 with 5 liters removed. Blood cultures negative. Cell count normal. Will need ongoing oral abx for secondary prophylaxis. Discussed with Vernell Beams, NP with Nephrology. Avoid Bactrim  in this case due to risk for hyperkalemia. May use Cipro  and keep at once daily dosing.   No abdominal pain. Good appetite. No N/V. Occasional GERD. Will happen mainly at dialysis. Was taken off PPI while hospitalized due to SBP. No dysphagia. No overt GI bleeding. No mental status changes or confusion. Denies any overt GI bleeding. He is eager to have endoscopy for variceal screening.   Cardiology following due to history of severe aortic stenosis and not pursuing AVR until symptomatic due to multimorbidities.      Jul 03, 2024 US  abdomen complete Cirrhosis with moderate asocites 2 small left renal cysts Normal spleen Portal vein patent.    AFP tumor marker normal in Nov 2025.  Acute hepatitis panel negative.  Hep C negative Sept 2024. Thorough serologies with ASMA very weakly positive but IgG, IgM normal. No iron  overload. Ferritin  elevated. ANA, AMA negative, ceruloplasmin level elevated and suspect acute reactant, unable to collect 24 hour urine copper as anuric.  Last MELD 3.0 was 19, driven by creatinine.      Endoscopic procedures:  Hx of hyperplastic polyps, colonoscopy 2026   Needs EGD for variceal screening but has been on hold due to severe aortic stenosis.    Past Medical History:  Diagnosis Date   Anemia    Arthritis    Asthma    Bell palsy    left side of face- comes and goes per pt on 11/12/23   CAD (coronary artery disease)    a. 2014 MV: abnl w/ infap ischemia; b. 03/2013 Cath: aneurysmal bleb in the LAD w/ otw nonobs dzs-->Med Rx.   Chronic back pain    Chronic kidney disease    on dialysis - mon wed fri   Chronic knee pain    a. 09/2015 s/p R TKA.   Chronic pain    Chronic shoulder pain    Chronic sinusitis    COPD (chronic obstructive pulmonary disease) (HCC)    Diabetes mellitus without complication (HCC)    type II , no meds, does not check blood sugar   Dyspnea    with exertion   Dysrhythmia    2nd degree AV block   ESRD on peritoneal dialysis (HCC)    on peritoneal dialysis, [facility] - mon wed fri   Essential hypertension    GERD (gastroesophageal reflux disease)    Gout    Hepatomegaly    noted on noncontrast CT 2015   History of  hiatal hernia    Hyperlipidemia    Hypothyroidism    Lateral meniscus tear    Myocardial infarction (HCC) 07/2021   Neuromuscular disorder (HCC)    Obesity    Truncal   Obstructive sleep apnea    does not use cpap    On home oxygen  therapy    uses 2L when needed   Pneumonia    PUD (peptic ulcer disease)    remote, reports f/u EGD about 8 years ago unremarkable    Reactive airway disease    related to exposure to chemical during 9/11   Sinusitis    Substance abuse (HCC)    hx in past   Vitamin D  deficiency     Past Surgical History:  Procedure Laterality Date   A/V FISTULAGRAM N/A 02/28/2023   Procedure: A/V Fistulagram;   Surgeon: Lanis Fonda BRAVO, MD;  Location: [facility] INVASIVE CV LAB;  Service: Cardiovascular;  Laterality: N/A;   A/V FISTULAGRAM N/A 10/22/2023   Procedure: A/V Fistulagram;  Surgeon: Sheree Penne Bruckner, MD;  Location: [facility] INVASIVE CV LAB;  Service: Cardiovascular;  Laterality: N/A;   ASAD LT SHOULDER  12/15/2008   left shoulder   AV FISTULA PLACEMENT Left 08/09/2016   Procedure: BRACHIOCEPHALIC ARTERIOVENOUS (AV) FISTULA CREATION LEFT ARM;  Surgeon: Carlin BRAVO Haddock, MD;  Location: [facility] OR;  Service: Vascular;  Laterality: Left;   BASCILIC VEIN TRANSPOSITION Left 11/13/2023   Procedure: TRANSPOSITION, VEIN, BASILIC;  Surgeon: Sheree Penne Bruckner, MD;  Location: [facility] OR;  Service: Vascular;  Laterality: Left;   CAPD INSERTION N/A 10/07/2018   Procedure: LAPAROSCOPIC PERITONEAL CATHETER PLACEMENT;  Surgeon: Stevie Herlene Righter, MD;  Location: [facility] ORS;  Service: General;  Laterality: N/A;   CATARACT EXTRACTION W/PHACO Left 03/28/2016   Procedure: CATARACT EXTRACTION PHACO AND INTRAOCULAR LENS PLACEMENT LEFT EYE;  Surgeon: Oneil Platts, MD;  Location: [facility] ORS;  Service: Ophthalmology;  Laterality: Left;  CDE: 4.77   CATARACT EXTRACTION W/PHACO Right 04/11/2016   Procedure: CATARACT EXTRACTION PHACO AND INTRAOCULAR LENS PLACEMENT RIGHT EYE; CDE:  4.74;  Surgeon: Oneil Platts, MD;  Location: [facility] ORS;  Service: Ophthalmology;  Laterality: Right;   COLONOSCOPY  10/15/2008   Fields: Rectal polyp obliterated, not retrieved, hemorrhoids, single ascending colon diverticulum near the CV. Next colonoscopy April 2020   COLONOSCOPY N/A 12/25/2014   SLF: 1. Colorectal polyps (2) removed 2. Small internal hemorrhoids 3. the left colon is severely redundant. hyperplastic polyps   CORONARY ARTERY BYPASS GRAFT N/A 06/05/2022   Procedure: OFF PUMP CORONARY ARTERY BYPASS GRAFTING (CABG) X 2 BYPASSES USING LEFT INTERNAL MAMMARY ARTERY AND RIGHT LEG GREATER SAPHENOUS VEIN HARVESTED ENDOSCOPICALLY;  Surgeon: Shyrl Linnie KIDD, MD;  Location: [facility] OR;  Service: Open Heart Surgery;  Laterality: N/A;   CORONARY STENT INTERVENTION N/A 07/25/2021   Procedure: CORONARY STENT INTERVENTION;  Surgeon: Wonda Sharper, MD;  Location: [facility] INVASIVE CV LAB;  Service: Cardiovascular;  Laterality: N/A;   CORONARY STENT INTERVENTION N/A 12/26/2021   Procedure: CORONARY STENT INTERVENTION;  Surgeon: Mady Bruckner, MD;  Location: [facility] INVASIVE CV LAB;  Service: Cardiovascular;  Laterality: N/A;   CORONARY STENT INTERVENTION N/A 01/20/2022   Procedure: CORONARY STENT INTERVENTION;  Surgeon: Wonda Sharper, MD;  Location: [facility] INVASIVE CV LAB;  Service: Cardiovascular;  Laterality: N/A;   DOPPLER ECHOCARDIOGRAPHY     ESOPHAGOGASTRODUODENOSCOPY N/A 12/25/2014   SLF: 1. Anemia most likely due to CRI, gastritis, gastric polyps 2. Moderate non-erosive gastriits and mild duodenitis.  3.TWo large gstric polyps removed.  EYE SURGERY  12/22/2010   tear duct probing-[place]   FOREIGN BODY REMOVAL  03/29/2011   Procedure: REMOVAL FOREIGN BODY EXTREMITY;  Surgeon: Taft Minerva, MD;  Location: [facility]S;  Service: Orthopedics;  Laterality: Right;  Removal Foreign Body Right Thumb   INSERTION OF DIALYSIS CATHETER N/A 11/13/2023   Procedure: INSERTION OF DIALYSIS CATHETER;  Surgeon: Sheree Penne Bruckner, MD;  Location: [facility] OR;  Service: Vascular;  Laterality: N/A;   IR FLUORO GUIDE CV LINE RIGHT  08/06/2018   IR FLUORO GUIDE CV LINE RIGHT  02/17/2023   IR PARACENTESIS  06/26/2024   IR PARACENTESIS  07/08/2024   IR PARACENTESIS  07/22/2024   IR US  GUIDE VASC ACCESS RIGHT  08/06/2018   IR US  GUIDE VASC ACCESS RIGHT  02/17/2023   KNEE ARTHROSCOPY  10/16/2007   left   KNEE ARTHROSCOPY WITH LATERAL MENISECTOMY Right 10/14/2015   Procedure: LEFT KNEE ARTHROSCOPY WITH PARTIAL LATERAL MENISECTOMY;  Surgeon: Taft FORBES Minerva, MD;  Location: [facility];  Service: Orthopedics;  Laterality: Right;   LEFT HEART CATH AND CORONARY ANGIOGRAPHY N/A  07/25/2021   Procedure: LEFT HEART CATH AND CORONARY ANGIOGRAPHY;  Surgeon: Wonda Sharper, MD;  Location: [facility] INVASIVE CV LAB;  Service: Cardiovascular;  Laterality: N/A;   LEFT HEART CATH AND CORONARY ANGIOGRAPHY N/A 12/26/2021   Procedure: LEFT HEART CATH AND CORONARY ANGIOGRAPHY;  Surgeon: Mady Bruckner, MD;  Location: [facility] INVASIVE CV LAB;  Service: Cardiovascular;  Laterality: N/A;   LEFT HEART CATH AND CORONARY ANGIOGRAPHY N/A 01/20/2022   Procedure: LEFT HEART CATH AND CORONARY ANGIOGRAPHY;  Surgeon: Wonda Sharper, MD;  Location: [facility] INVASIVE CV LAB;  Service: Cardiovascular;  Laterality: N/A;   LEFT HEART CATHETERIZATION WITH CORONARY ANGIOGRAM N/A 03/28/2013   Procedure: LEFT HEART CATHETERIZATION WITH CORONARY ANGIOGRAM;  Surgeon: Alm LELON Clay, MD;  Location: [facility] CATH LAB;  Service: Cardiovascular;  Laterality: N/A;   NM MYOVIEW  LTD     PENILE PROSTHESIS IMPLANT N/A 08/16/2015   Procedure: PENILE PROTHESIS INFLATABLE, three piece, Excisional biopsy of Penile ulcer, Penile molding;  Surgeon: Arlena Gal, MD;  Location: [facility];  Service: Urology;  Laterality: N/A;   PENILE PROSTHESIS IMPLANT N/A 12/24/2017   Procedure: REMOVAL AND  REPLACEMENT  COLOPLAST PENILE PROSTHESIS;  Surgeon: Carolee Sherwood JONETTA DOUGLAS, MD;  Location: [facility] ORS;  Service: Urology;  Laterality: N/A;   PERIPHERAL VASCULAR BALLOON ANGIOPLASTY  02/28/2023   Procedure: PERIPHERAL VASCULAR BALLOON ANGIOPLASTY;  Surgeon: Lanis Fonda FORBES, MD;  Location: [facility] INVASIVE CV LAB;  Service: Cardiovascular;;   PORT-A-CATH REMOVAL N/A 02/19/2023   Procedure: REMOVAL OF PERITONEAL DIALYSIS CATHETER;  Surgeon: Lanis Fonda FORBES, MD;  Location: [facility] OR;  Service: Vascular;  Laterality: N/A;   QUADRICEPS TENDON REPAIR  07/21/2011   Procedure: REPAIR QUADRICEP TENDON;  Surgeon: Taft Minerva, MD;  Location: [facility];  Service: Orthopedics;  Laterality: Right;   REVISON OF ARTERIOVENOUS FISTULA Left 11/13/2023   Procedure: REVISON OF  ARTERIOVENOUS FISTULA;  Surgeon: Sheree Penne Bruckner, MD;  Location: [facility] OR;  Service: Vascular;  Laterality: Left;   RIGHT/LEFT HEART CATH AND CORONARY ANGIOGRAPHY N/A 05/30/2022   Procedure: RIGHT/LEFT HEART CATH AND CORONARY ANGIOGRAPHY;  Surgeon: Dann Candyce RAMAN, MD;  Location: [facility] INVASIVE CV LAB;  Service: Cardiovascular;  Laterality: N/A;   TEE WITHOUT CARDIOVERSION N/A 06/05/2022   Procedure: TRANSESOPHAGEAL ECHOCARDIOGRAM (TEE);  Surgeon: Shyrl Linnie KIDD, MD;  Location: [facility] OR;  Service: Open Heart Surgery;  Laterality: N/A;   TOENAIL EXCISION     removed x2-bilateral  UMBILICAL HERNIA REPAIR  07/17/2005   [place]    Current Outpatient Medications  Medication Sig Dispense Refill   acetaminophen  (TYLENOL ) 325 MG tablet Take 2 tablets (650 mg total) by mouth every 6 (six) hours as needed for mild pain (pain score 1-3), moderate pain (pain score 4-6), fever or headache.     albuterol  (VENTOLIN  HFA) 108 (90 Base) MCG/ACT inhaler Inhale 2 puffs into the lungs every 6 (six) hours as needed for wheezing or shortness of breath. 18 g 11   amLODipine  (NORVASC ) 5 MG tablet Take 1 tablet (5 mg total) by mouth daily. 30 tablet 5   ascorbic acid  (VITAMIN C ) 500 MG tablet Take 1 tablet (500 mg total) by mouth daily. 100 tablet 0   aspirin  EC 81 MG tablet Take 1 tablet (81 mg total) by mouth daily with breakfast. Swallow whole. 30 tablet 11   atorvastatin  (LIPITOR ) 80 MG tablet Take 1 tablet (80 mg total) by mouth daily. 30 tablet 1   benzonatate  (TESSALON ) 100 MG capsule Take 100 mg by mouth daily.     calcitRIOL  (ROCALTROL ) 0.5 MCG capsule Take 0.5 mcg by mouth daily.     carvedilol  (COREG ) 6.25 MG tablet Take 6.25 mg by mouth 2 (two) times daily.     ceFEPIme  2 g in sodium chloride  0.9 % 100 mL Inject 2 g into the vein every Monday, Wednesday, and Friday. cefepime  2g with each dialysis MWF--for 2 weeks. End of treatment would be July 21, 2024     divalproex  (DEPAKOTE ) 250 MG DR  tablet Take 250 mg by mouth 2 (two) times daily.     ferric citrate  (AURYXIA ) 1 GM 210 MG(Fe) tablet Take 420 mg by mouth 3 (three) times daily with meals.     Fluticasone -Umeclidin-Vilant (TRELEGY ELLIPTA ) 200-62.5-25 MCG/ACT AEPB Inhale 1 puff into the lungs daily at 12 noon. 60 each 5   loratadine  (CLARITIN ) 10 MG tablet Take 10 mg by mouth daily.     midodrine  (PROAMATINE ) 10 MG tablet Take 1 tablet (10 mg total) by mouth every Monday, Wednesday, and Friday. Mon, Wed, and Friday---with Hemodialysis 45 tablet 3   Multiple Vitamins-Minerals (MENS MULTIVITAMIN PO) Take 1 tablet by mouth daily.     OVER THE COUNTER MEDICATION Take 2 tablets by mouth daily at 6 (six) AM.     tamsulosin  (FLOMAX ) 0.4 MG CAPS capsule Take 0.4 mg by mouth daily after supper.     No current facility-administered medications for this visit.    Allergies as of 08/21/2024 - Review Complete 08/21/2024  Allergen Reaction Noted   Tramadol  Itching 07/10/2011   Opana [oxymorphone hcl] Itching 01/15/2014    Family History  Problem Relation Age of Onset   Hypertension Mother        MI   Cancer Mother        breast    Diabetes Mother    Diabetes Father    Hypertension Father    Hypertension Sister    Diabetes Sister    Arthritis Other    Asthma Other    Lung disease Other    Anesthesia problems Neg Hx    Hypotension Neg Hx    Malignant hyperthermia Neg Hx    Pseudochol deficiency Neg Hx    Colon cancer Neg Hx     Social History   Socioeconomic History   Marital status: Married    Spouse name: Not on file   Number of children: 2   Years of education: 12th grade  Highest education level: Not on file  Occupational History   Occupation: disabled   Occupation:      Associate Professor: UNEMPLOYED  Tobacco Use   Smoking status: Former    Current packs/day: 0.00    Average packs/day: 1 pack/day for 25.0 years (25.0 ttl pk-yrs)    Types: Cigarettes    Start date: 03/27/1985    Quit date: 03/27/2010    Years  since quitting: 14.4   Smokeless tobacco: Never   Tobacco comments:    Quit in 2011  Vaping Use   Vaping status: Never Used  Substance and Sexual Activity   Alcohol  use: Yes    Comment: occasionally liquor   Drug use: Yes    Types: Marijuana    Comment: cocaine- last time used- 11/24/2017 , marijuana- last use 11/10/23 withhold 24 hrs prior to procedure   Sexual activity: Yes    Birth control/protection: None  Other Topics Concern   Not on file  Social History Narrative   He quit smoking in 2010. 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 He developed pulmonary problems, became disabled because of lower airway disease in 2009.       WATCHES BASKETBALL. HIS TEAM IS [place].   Social Drivers of Health   Tobacco Use: Medium Risk (08/21/2024)   Patient History    Smoking Tobacco Use: Former    Smokeless Tobacco Use: Never    Passive Exposure: Not on file  Financial Resource Strain: Low Risk (03/05/2023)   Received from [facility]   Overall Financial Resource Strain (CARDIA)    Difficulty of Paying Living Expenses: Not hard at all  Food Insecurity: No Food Insecurity (07/15/2024)   Epic    Worried About Programme Researcher, Broadcasting/film/video in the Last Year: Never true    Ran Out of Food in the Last Year: Never true  Transportation Needs: No Transportation Needs (07/15/2024)   Epic    Lack of Transportation (Medical): No    Lack of Transportation (Non-Medical): No  Physical Activity: Sufficiently Active (12/29/2021)   Exercise Vital Sign    Days of Exercise per Week: 3 days    Minutes of Exercise per Session: 60 min  Stress: Patient Declined (03/12/2023)   Received from [employer] [facility] [employer] - Occupational Stress Questionnaire    Feeling of Stress : Patient declined  Social Connections: Socially Integrated (07/09/2024)   Social Connection and Isolation Panel    Frequency of Communication with Friends and Family: More  than three times a week    Frequency of Social Gatherings with Friends and Family: More than three times a week    Attends Religious Services: More than 4 times per year    Active Member of Clubs or Organizations: Yes    Attends Banker Meetings: More than 4 times per year    Marital Status: Married  Catering Manager Violence: Not At Risk (07/15/2024)   Epic    Fear of Current or Ex-Partner: No    Emotionally Abused: No    Physically Abused: No    Sexually Abused: No  Depression (PHQ2-9): Low Risk (07/15/2024)   Depression (PHQ2-9)    PHQ-2 Score: 0  Alcohol  Screen: Low Risk (12/29/2021)   Alcohol  Screen    Last Alcohol  Screening Score (AUDIT): 0  Housing: Unknown (07/15/2024)   Epic    Unable to Pay for Housing in the Last Year: No    Number of Times  Moved in the Last Year: Not on file    Homeless in the Last Year: No  Utilities: Not At Risk (07/15/2024)   Epic    Threatened with loss of utilities: No  Health Literacy: Not on file     Review of Systems   Gen: Denies any fever, chills, fatigue, weight loss, lack of appetite.  CV: Denies chest pain, heart palpitations, peripheral edema, syncope.  Resp: Denies shortness of breath at rest or with exertion. Denies wheezing or cough.  GI: Denies dysphagia or odynophagia. Denies jaundice, hematemesis, fecal incontinence. GU : Denies urinary burning, urinary frequency, urinary hesitancy MS: Denies joint pain, muscle weakness, cramps, or limitation of movement.  Derm: Denies rash, itching, dry skin Psych: Denies depression, anxiety, memory loss, and confusion Heme: Denies bruising, bleeding, and enlarged lymph nodes.   Physical Exam   BP 109/62   Pulse 61   Temp 98.6 F (37 C)   Ht 5' 4 (1.626 m)   Wt 214 lb 9.6 oz (97.3 kg)   BMI 36.84 kg/m  General:   Alert and oriented. Pleasant and cooperative. Well-nourished and well-developed.  Head:  Normocephalic and atraumatic. Eyes:  Without icterus Abdomen:   +BS, soft, non-tender and non-distended. No HSM noted. No guarding or rebound. No masses appreciated.  Rectal:  Deferred  Msk:  Symmetrical without gross deformities. Normal posture. Extremities:  Without edema. Neurologic:  Alert and  oriented x4;  grossly normal neurologically. Skin:  Intact without significant lesions or rashes. Psych:  Alert and cooperative. Normal mood and affect.   Assessment   [name] [name] is a 74 y.o. male presenting today with a history of  GERD, multifactorial anemia with chronic disease/IDA, previously on peritoneal dialysis complicated by previous intra-abdominal infection from peritoneal dialysis catheter and now currently on HD, cirrhosis, gallbladder polyp, severe aortic stenosis, CAD s/p CABG, diabetes, COPD, HTN, HLD, OSA, CHF, culture negative neutrocytic ascites in Dec 2025 with repeat para increased WBC count despite outpatient treatment and inpatient end of Dec 2025 with IV antibiotics. Returning for close follow-up.  Decompensated cirrhosis with culture negative neutrocytic ascites and high risk for recurrence. MELD 3.0 19 in Dec 2025, driven by Creatinine ~repeat para Jan 2026 with cell count normalized, cultures negative ~asymptomatic currently and will start Cipro  500 mg daily for secondary prophylaxis. Discussed with Nephrology regarding dosing.  ~Suspect cirrhosis due to [facility] ~Needs EGD for variceal screening but severe aortic stenosis will likely preclude elective procedure here at [facility]. To discuss with anesthesiology here ~Not Immune to Hep A or B: recommend vaccinations ~MELD labs, AFP ordered now ~US  in June 2026 for [facility] screening   GERD managed without PPI. Recommend avoiding PPI in this case due to history of SBP  Hx of hyperplastic polyps with routine screening due in June 2026. Can request to be done at [facility] if anesthesia candidate at time of EGD; otherwise, will see if [name] [name] is able to accommodate.     PLAN     Cipro  500 mg daily for secondary SBP prophylaxis MELD labs, AFP  RUQ US  in June 2026 Avoiding PPI  Will discuss with anesthesia here his candidacy for EGD/colonoscopy in light of severe AS Needs Hep A and B vaccinations 3 month follow-up   Therisa MICAEL Stager, PhD, ANP-BC [facility]    "

## 2024-09-18 ENCOUNTER — Ambulatory Visit: Admitting: Internal Medicine

## 2024-09-30 ENCOUNTER — Ambulatory Visit: Admitting: Nurse Practitioner

## 2024-11-18 ENCOUNTER — Ambulatory Visit: Admitting: Gastroenterology

## 2025-01-29 ENCOUNTER — Other Ambulatory Visit (HOSPITAL_COMMUNITY)
